# Patient Record
Sex: Male | Born: 1965
Health system: Southern US, Community
[De-identification: ages and names within clinical notes are randomized; demographics above are authoritative.]

## PROBLEM LIST (undated history)

## (undated) DIAGNOSIS — T4145XA Adverse effect of unspecified anesthetic, initial encounter: Secondary | ICD-10-CM

## (undated) DIAGNOSIS — I2510 Atherosclerotic heart disease of native coronary artery without angina pectoris: Secondary | ICD-10-CM

## (undated) DIAGNOSIS — F40240 Claustrophobia: Secondary | ICD-10-CM

## (undated) DIAGNOSIS — I10 Essential (primary) hypertension: Secondary | ICD-10-CM

## (undated) DIAGNOSIS — K5790 Diverticulosis of intestine, part unspecified, without perforation or abscess without bleeding: Secondary | ICD-10-CM

## (undated) DIAGNOSIS — K819 Cholecystitis, unspecified: Secondary | ICD-10-CM

## (undated) DIAGNOSIS — I3139 Other pericardial effusion (noninflammatory): Secondary | ICD-10-CM

## (undated) DIAGNOSIS — I5032 Chronic diastolic (congestive) heart failure: Secondary | ICD-10-CM

## (undated) DIAGNOSIS — R112 Nausea with vomiting, unspecified: Secondary | ICD-10-CM

## (undated) DIAGNOSIS — Z9289 Personal history of other medical treatment: Secondary | ICD-10-CM

## (undated) DIAGNOSIS — K224 Dyskinesia of esophagus: Secondary | ICD-10-CM

## (undated) DIAGNOSIS — K219 Gastro-esophageal reflux disease without esophagitis: Secondary | ICD-10-CM

## (undated) DIAGNOSIS — M1990 Unspecified osteoarthritis, unspecified site: Secondary | ICD-10-CM

## (undated) DIAGNOSIS — E119 Type 2 diabetes mellitus without complications: Secondary | ICD-10-CM

## (undated) DIAGNOSIS — Z9889 Other specified postprocedural states: Secondary | ICD-10-CM

## (undated) DIAGNOSIS — E109 Type 1 diabetes mellitus without complications: Secondary | ICD-10-CM

## (undated) DIAGNOSIS — Z87442 Personal history of urinary calculi: Secondary | ICD-10-CM

## (undated) DIAGNOSIS — M109 Gout, unspecified: Secondary | ICD-10-CM

## (undated) DIAGNOSIS — T884XXA Failed or difficult intubation, initial encounter: Secondary | ICD-10-CM

## (undated) DIAGNOSIS — I5030 Unspecified diastolic (congestive) heart failure: Secondary | ICD-10-CM

## (undated) DIAGNOSIS — J398 Other specified diseases of upper respiratory tract: Secondary | ICD-10-CM

## (undated) DIAGNOSIS — G4733 Obstructive sleep apnea (adult) (pediatric): Secondary | ICD-10-CM

## (undated) DIAGNOSIS — IMO0001 Reserved for inherently not codable concepts without codable children: Secondary | ICD-10-CM

## (undated) DIAGNOSIS — Z794 Long term (current) use of insulin: Secondary | ICD-10-CM

## (undated) DIAGNOSIS — I313 Pericardial effusion (noninflammatory): Secondary | ICD-10-CM

## (undated) DIAGNOSIS — R590 Localized enlarged lymph nodes: Secondary | ICD-10-CM

## (undated) DIAGNOSIS — T8859XA Other complications of anesthesia, initial encounter: Secondary | ICD-10-CM

## (undated) DIAGNOSIS — E039 Hypothyroidism, unspecified: Secondary | ICD-10-CM

## (undated) DIAGNOSIS — B3781 Candidal esophagitis: Secondary | ICD-10-CM

## (undated) DIAGNOSIS — J189 Pneumonia, unspecified organism: Secondary | ICD-10-CM

## (undated) DIAGNOSIS — K602 Anal fissure, unspecified: Secondary | ICD-10-CM

## (undated) DIAGNOSIS — M797 Fibromyalgia: Secondary | ICD-10-CM

## (undated) DIAGNOSIS — E785 Hyperlipidemia, unspecified: Secondary | ICD-10-CM

## (undated) DIAGNOSIS — N186 End stage renal disease: Secondary | ICD-10-CM

## (undated) DIAGNOSIS — K3184 Gastroparesis: Secondary | ICD-10-CM

## (undated) DIAGNOSIS — I739 Peripheral vascular disease, unspecified: Secondary | ICD-10-CM

## (undated) HISTORY — DX: Dyskinesia of esophagus: K22.4

## (undated) HISTORY — PX: ARTERIOVENOUS GRAFT PLACEMENT: SUR1029

## (undated) HISTORY — PX: OTHER SURGICAL HISTORY: SHX169

## (undated) HISTORY — PX: AMPUTATION OF REPLICATED TOES: SHX1136

## (undated) HISTORY — PX: CARPAL TUNNEL RELEASE: SHX101

## (undated) HISTORY — DX: Chronic diastolic (congestive) heart failure: I50.32

## (undated) HISTORY — PX: SHOULDER ARTHROSCOPY: SHX128

## (undated) HISTORY — PX: CATARACT EXTRACTION W/ INTRAOCULAR LENS  IMPLANT, BILATERAL: SHX1307

## (undated) HISTORY — PX: EYE SURGERY: SHX253

## (undated) HISTORY — DX: Anal fissure, unspecified: K60.2

## (undated) HISTORY — PX: APPENDECTOMY: SHX54

## (undated) HISTORY — PX: VITRECTOMY: SHX106

## (undated) HISTORY — DX: Hyperlipidemia, unspecified: E78.5

## (undated) HISTORY — PX: PERITONEAL CATHETER INSERTION: SHX2223

## (undated) HISTORY — DX: Candidal esophagitis: B37.81

## (undated) HISTORY — PX: PERITONEAL CATHETER REMOVAL: SUR1106

## (undated) HISTORY — DX: Unspecified diastolic (congestive) heart failure: I50.30

## (undated) HISTORY — PX: NEUROPLASTY / TRANSPOSITION ULNAR NERVE AT ELBOW: SUR895

## (undated) HISTORY — PX: TOE AMPUTATION: SHX809

## (undated) HISTORY — PX: UMBILICAL HERNIA REPAIR: SHX196

## (undated) HISTORY — DX: Diverticulosis of intestine, part unspecified, without perforation or abscess without bleeding: K57.90

---

## 1993-04-25 HISTORY — PX: KIDNEY TRANSPLANT: SHX239

## 1997-06-04 ENCOUNTER — Ambulatory Visit: Admission: RE | Admit: 1997-06-04 | Discharge: 1997-07-22 | Payer: Self-pay | Admitting: Internal Medicine

## 1997-07-28 ENCOUNTER — Other Ambulatory Visit: Admission: RE | Admit: 1997-07-28 | Discharge: 1997-07-28 | Payer: Self-pay | Admitting: *Deleted

## 1997-09-02 ENCOUNTER — Other Ambulatory Visit: Admission: RE | Admit: 1997-09-02 | Discharge: 1997-09-02 | Payer: Self-pay | Admitting: *Deleted

## 1997-09-09 ENCOUNTER — Other Ambulatory Visit: Admission: RE | Admit: 1997-09-09 | Discharge: 1997-09-09 | Payer: Self-pay | Admitting: *Deleted

## 1997-09-12 ENCOUNTER — Ambulatory Visit (HOSPITAL_COMMUNITY): Admission: RE | Admit: 1997-09-12 | Discharge: 1997-09-12 | Payer: Self-pay | Admitting: *Deleted

## 1997-09-16 ENCOUNTER — Other Ambulatory Visit: Admission: RE | Admit: 1997-09-16 | Discharge: 1997-09-16 | Payer: Self-pay | Admitting: *Deleted

## 1997-09-23 ENCOUNTER — Encounter: Admission: RE | Admit: 1997-09-23 | Discharge: 1997-12-22 | Payer: Self-pay | Admitting: Internal Medicine

## 1997-09-23 ENCOUNTER — Encounter: Admission: RE | Admit: 1997-09-23 | Discharge: 1997-09-23 | Payer: Self-pay | Admitting: Internal Medicine

## 1997-09-29 ENCOUNTER — Other Ambulatory Visit: Admission: RE | Admit: 1997-09-29 | Discharge: 1997-09-29 | Payer: Self-pay | Admitting: *Deleted

## 1997-10-06 ENCOUNTER — Other Ambulatory Visit: Admission: RE | Admit: 1997-10-06 | Discharge: 1997-10-06 | Payer: Self-pay | Admitting: *Deleted

## 1997-10-13 ENCOUNTER — Other Ambulatory Visit: Admission: RE | Admit: 1997-10-13 | Discharge: 1997-10-13 | Payer: Self-pay | Admitting: *Deleted

## 1997-10-24 ENCOUNTER — Other Ambulatory Visit: Admission: RE | Admit: 1997-10-24 | Discharge: 1997-10-24 | Payer: Self-pay | Admitting: *Deleted

## 1997-10-31 ENCOUNTER — Ambulatory Visit (HOSPITAL_COMMUNITY): Admission: RE | Admit: 1997-10-31 | Discharge: 1997-10-31 | Payer: Self-pay | Admitting: Nephrology

## 1997-11-14 ENCOUNTER — Other Ambulatory Visit: Admission: RE | Admit: 1997-11-14 | Discharge: 1997-11-14 | Payer: Self-pay | Admitting: *Deleted

## 1997-11-21 ENCOUNTER — Other Ambulatory Visit: Admission: RE | Admit: 1997-11-21 | Discharge: 1997-11-21 | Payer: Self-pay | Admitting: *Deleted

## 1997-12-30 ENCOUNTER — Encounter: Admission: RE | Admit: 1997-12-30 | Discharge: 1998-03-30 | Payer: Self-pay | Admitting: Internal Medicine

## 1998-01-28 ENCOUNTER — Encounter (HOSPITAL_BASED_OUTPATIENT_CLINIC_OR_DEPARTMENT_OTHER): Payer: Self-pay | Admitting: Internal Medicine

## 1998-02-23 ENCOUNTER — Ambulatory Visit (HOSPITAL_COMMUNITY): Admission: RE | Admit: 1998-02-23 | Discharge: 1998-02-23 | Payer: Self-pay | Admitting: Internal Medicine

## 1998-04-21 ENCOUNTER — Encounter: Admission: RE | Admit: 1998-04-21 | Discharge: 1998-05-15 | Payer: Self-pay | Admitting: Internal Medicine

## 1998-04-27 ENCOUNTER — Ambulatory Visit (HOSPITAL_COMMUNITY): Admission: RE | Admit: 1998-04-27 | Discharge: 1998-04-27 | Payer: Self-pay | Admitting: Internal Medicine

## 1998-04-27 ENCOUNTER — Encounter (HOSPITAL_BASED_OUTPATIENT_CLINIC_OR_DEPARTMENT_OTHER): Payer: Self-pay | Admitting: Internal Medicine

## 1998-07-01 ENCOUNTER — Encounter: Admission: RE | Admit: 1998-07-01 | Discharge: 1998-09-10 | Payer: Self-pay | Admitting: Internal Medicine

## 1998-07-03 ENCOUNTER — Ambulatory Visit (HOSPITAL_COMMUNITY): Admission: RE | Admit: 1998-07-03 | Discharge: 1998-07-03 | Payer: Self-pay | Admitting: Internal Medicine

## 1998-07-29 ENCOUNTER — Encounter (HOSPITAL_BASED_OUTPATIENT_CLINIC_OR_DEPARTMENT_OTHER): Payer: Self-pay | Admitting: Internal Medicine

## 1998-08-04 ENCOUNTER — Emergency Department (HOSPITAL_COMMUNITY): Admission: EM | Admit: 1998-08-04 | Discharge: 1998-08-04 | Payer: Self-pay | Admitting: Emergency Medicine

## 1998-09-20 ENCOUNTER — Emergency Department (HOSPITAL_COMMUNITY): Admission: EM | Admit: 1998-09-20 | Discharge: 1998-09-20 | Payer: Self-pay

## 1999-05-04 ENCOUNTER — Ambulatory Visit (HOSPITAL_BASED_OUTPATIENT_CLINIC_OR_DEPARTMENT_OTHER): Admission: RE | Admit: 1999-05-04 | Discharge: 1999-05-04 | Payer: Self-pay | Admitting: Orthopedic Surgery

## 1999-10-20 ENCOUNTER — Encounter: Admission: RE | Admit: 1999-10-20 | Discharge: 2000-01-18 | Payer: Self-pay | Admitting: Internal Medicine

## 1999-10-28 ENCOUNTER — Emergency Department (HOSPITAL_COMMUNITY): Admission: EM | Admit: 1999-10-28 | Discharge: 1999-10-29 | Payer: Self-pay | Admitting: Emergency Medicine

## 1999-11-25 ENCOUNTER — Emergency Department (HOSPITAL_COMMUNITY): Admission: EM | Admit: 1999-11-25 | Discharge: 1999-11-25 | Payer: Self-pay | Admitting: Emergency Medicine

## 2000-01-26 ENCOUNTER — Encounter: Admission: RE | Admit: 2000-01-26 | Discharge: 2000-04-25 | Payer: Self-pay | Admitting: Internal Medicine

## 2000-02-08 ENCOUNTER — Ambulatory Visit (HOSPITAL_COMMUNITY): Admission: RE | Admit: 2000-02-08 | Discharge: 2000-02-08 | Payer: Self-pay | Admitting: Ophthalmology

## 2000-02-08 ENCOUNTER — Encounter: Payer: Self-pay | Admitting: Ophthalmology

## 2000-04-05 ENCOUNTER — Encounter: Admission: RE | Admit: 2000-04-05 | Discharge: 2000-07-04 | Payer: Self-pay | Admitting: Internal Medicine

## 2000-04-26 ENCOUNTER — Encounter: Admission: RE | Admit: 2000-04-26 | Discharge: 2000-07-03 | Payer: Self-pay | Admitting: Internal Medicine

## 2000-07-07 ENCOUNTER — Ambulatory Visit (HOSPITAL_BASED_OUTPATIENT_CLINIC_OR_DEPARTMENT_OTHER): Admission: RE | Admit: 2000-07-07 | Discharge: 2000-07-07 | Payer: Self-pay | Admitting: Orthopedic Surgery

## 2000-08-09 ENCOUNTER — Encounter: Admission: RE | Admit: 2000-08-09 | Discharge: 2000-11-07 | Payer: Self-pay | Admitting: Internal Medicine

## 2000-10-13 ENCOUNTER — Encounter: Payer: Self-pay | Admitting: Emergency Medicine

## 2000-10-13 ENCOUNTER — Inpatient Hospital Stay (HOSPITAL_COMMUNITY): Admission: EM | Admit: 2000-10-13 | Discharge: 2000-10-14 | Payer: Self-pay | Admitting: Emergency Medicine

## 2000-12-09 ENCOUNTER — Emergency Department (HOSPITAL_COMMUNITY): Admission: EM | Admit: 2000-12-09 | Discharge: 2000-12-09 | Payer: Self-pay | Admitting: Emergency Medicine

## 2001-01-24 ENCOUNTER — Encounter: Admission: RE | Admit: 2001-01-24 | Discharge: 2001-01-29 | Payer: Self-pay | Admitting: Internal Medicine

## 2001-02-22 ENCOUNTER — Emergency Department (HOSPITAL_COMMUNITY): Admission: EM | Admit: 2001-02-22 | Discharge: 2001-02-22 | Payer: Self-pay | Admitting: Emergency Medicine

## 2001-02-22 ENCOUNTER — Encounter: Payer: Self-pay | Admitting: Emergency Medicine

## 2001-05-07 ENCOUNTER — Encounter: Payer: Self-pay | Admitting: Internal Medicine

## 2001-05-07 ENCOUNTER — Encounter: Admission: RE | Admit: 2001-05-07 | Discharge: 2001-05-07 | Payer: Self-pay | Admitting: Internal Medicine

## 2001-05-09 ENCOUNTER — Encounter: Admission: RE | Admit: 2001-05-09 | Discharge: 2001-06-06 | Payer: Self-pay | Admitting: Internal Medicine

## 2001-05-31 ENCOUNTER — Emergency Department (HOSPITAL_COMMUNITY): Admission: EM | Admit: 2001-05-31 | Discharge: 2001-05-31 | Payer: Self-pay

## 2001-05-31 ENCOUNTER — Encounter: Payer: Self-pay | Admitting: Nephrology

## 2002-08-02 ENCOUNTER — Encounter (HOSPITAL_BASED_OUTPATIENT_CLINIC_OR_DEPARTMENT_OTHER): Admission: RE | Admit: 2002-08-02 | Discharge: 2002-10-31 | Payer: Self-pay | Admitting: Internal Medicine

## 2002-09-03 ENCOUNTER — Inpatient Hospital Stay (HOSPITAL_COMMUNITY): Admission: AD | Admit: 2002-09-03 | Discharge: 2002-09-13 | Payer: Self-pay | Admitting: Nephrology

## 2002-09-04 ENCOUNTER — Encounter: Payer: Self-pay | Admitting: Nephrology

## 2002-09-06 ENCOUNTER — Encounter: Payer: Self-pay | Admitting: Nephrology

## 2002-09-07 ENCOUNTER — Encounter: Payer: Self-pay | Admitting: Nephrology

## 2002-09-08 ENCOUNTER — Encounter (INDEPENDENT_AMBULATORY_CARE_PROVIDER_SITE_OTHER): Payer: Self-pay | Admitting: *Deleted

## 2002-09-09 ENCOUNTER — Encounter: Payer: Self-pay | Admitting: Nephrology

## 2002-09-10 ENCOUNTER — Encounter: Payer: Self-pay | Admitting: Nephrology

## 2002-09-24 ENCOUNTER — Encounter: Payer: Self-pay | Admitting: Emergency Medicine

## 2002-09-24 ENCOUNTER — Inpatient Hospital Stay (HOSPITAL_COMMUNITY): Admission: EM | Admit: 2002-09-24 | Discharge: 2002-10-04 | Payer: Self-pay | Admitting: Emergency Medicine

## 2002-09-25 ENCOUNTER — Encounter: Payer: Self-pay | Admitting: Nephrology

## 2002-11-15 ENCOUNTER — Encounter (HOSPITAL_BASED_OUTPATIENT_CLINIC_OR_DEPARTMENT_OTHER): Admission: RE | Admit: 2002-11-15 | Discharge: 2003-02-13 | Payer: Self-pay | Admitting: Internal Medicine

## 2002-11-21 ENCOUNTER — Encounter (HOSPITAL_BASED_OUTPATIENT_CLINIC_OR_DEPARTMENT_OTHER): Admission: RE | Admit: 2002-11-21 | Discharge: 2003-02-19 | Payer: Self-pay | Admitting: Internal Medicine

## 2002-12-18 ENCOUNTER — Encounter (HOSPITAL_BASED_OUTPATIENT_CLINIC_OR_DEPARTMENT_OTHER): Payer: Self-pay | Admitting: Internal Medicine

## 2002-12-18 ENCOUNTER — Encounter: Admission: RE | Admit: 2002-12-18 | Discharge: 2002-12-18 | Payer: Self-pay | Admitting: Internal Medicine

## 2003-02-17 ENCOUNTER — Encounter (HOSPITAL_BASED_OUTPATIENT_CLINIC_OR_DEPARTMENT_OTHER): Admission: RE | Admit: 2003-02-17 | Discharge: 2003-05-18 | Payer: Self-pay | Admitting: Internal Medicine

## 2003-02-20 ENCOUNTER — Emergency Department (HOSPITAL_COMMUNITY): Admission: EM | Admit: 2003-02-20 | Discharge: 2003-02-20 | Payer: Self-pay | Admitting: Emergency Medicine

## 2003-03-30 ENCOUNTER — Ambulatory Visit (HOSPITAL_COMMUNITY): Admission: RE | Admit: 2003-03-30 | Discharge: 2003-03-30 | Payer: Self-pay | Admitting: Internal Medicine

## 2003-04-15 ENCOUNTER — Encounter: Admission: RE | Admit: 2003-04-15 | Discharge: 2003-04-15 | Payer: Self-pay | Admitting: Nephrology

## 2003-05-20 ENCOUNTER — Encounter (HOSPITAL_BASED_OUTPATIENT_CLINIC_OR_DEPARTMENT_OTHER): Admission: RE | Admit: 2003-05-20 | Discharge: 2003-08-18 | Payer: Self-pay | Admitting: Internal Medicine

## 2003-06-20 ENCOUNTER — Encounter (INDEPENDENT_AMBULATORY_CARE_PROVIDER_SITE_OTHER): Payer: Self-pay | Admitting: Specialist

## 2003-06-20 ENCOUNTER — Ambulatory Visit (HOSPITAL_COMMUNITY): Admission: RE | Admit: 2003-06-20 | Discharge: 2003-06-21 | Payer: Self-pay | Admitting: Orthopedic Surgery

## 2003-08-13 ENCOUNTER — Encounter (HOSPITAL_BASED_OUTPATIENT_CLINIC_OR_DEPARTMENT_OTHER): Admission: RE | Admit: 2003-08-13 | Discharge: 2003-10-31 | Payer: Self-pay | Admitting: Internal Medicine

## 2003-08-21 ENCOUNTER — Inpatient Hospital Stay (HOSPITAL_COMMUNITY): Admission: AD | Admit: 2003-08-21 | Discharge: 2003-08-27 | Payer: Self-pay | Admitting: Nephrology

## 2003-09-03 ENCOUNTER — Encounter (HOSPITAL_COMMUNITY): Admission: RE | Admit: 2003-09-03 | Discharge: 2003-12-02 | Payer: Self-pay | Admitting: Nephrology

## 2003-11-12 ENCOUNTER — Encounter (HOSPITAL_BASED_OUTPATIENT_CLINIC_OR_DEPARTMENT_OTHER): Admission: RE | Admit: 2003-11-12 | Discharge: 2004-02-10 | Payer: Self-pay | Admitting: Internal Medicine

## 2004-02-02 ENCOUNTER — Encounter (HOSPITAL_COMMUNITY): Admission: RE | Admit: 2004-02-02 | Discharge: 2004-05-02 | Payer: Self-pay | Admitting: Nephrology

## 2004-02-03 ENCOUNTER — Ambulatory Visit (HOSPITAL_BASED_OUTPATIENT_CLINIC_OR_DEPARTMENT_OTHER): Admission: RE | Admit: 2004-02-03 | Discharge: 2004-02-03 | Payer: Self-pay | Admitting: Orthopedic Surgery

## 2004-02-05 ENCOUNTER — Encounter: Admission: RE | Admit: 2004-02-05 | Discharge: 2004-02-05 | Payer: Self-pay | Admitting: Nephrology

## 2004-02-11 ENCOUNTER — Encounter (HOSPITAL_BASED_OUTPATIENT_CLINIC_OR_DEPARTMENT_OTHER): Admission: RE | Admit: 2004-02-11 | Discharge: 2004-05-07 | Payer: Self-pay | Admitting: Internal Medicine

## 2004-05-10 ENCOUNTER — Encounter (HOSPITAL_BASED_OUTPATIENT_CLINIC_OR_DEPARTMENT_OTHER): Admission: RE | Admit: 2004-05-10 | Discharge: 2004-07-21 | Payer: Self-pay | Admitting: Internal Medicine

## 2004-05-12 ENCOUNTER — Encounter (HOSPITAL_COMMUNITY): Admission: RE | Admit: 2004-05-12 | Discharge: 2004-08-10 | Payer: Self-pay | Admitting: Nephrology

## 2004-05-27 ENCOUNTER — Encounter: Admission: RE | Admit: 2004-05-27 | Discharge: 2004-05-27 | Payer: Self-pay | Admitting: Surgery

## 2004-07-26 ENCOUNTER — Encounter (HOSPITAL_BASED_OUTPATIENT_CLINIC_OR_DEPARTMENT_OTHER): Admission: RE | Admit: 2004-07-26 | Discharge: 2004-10-24 | Payer: Self-pay | Admitting: Surgery

## 2004-08-19 ENCOUNTER — Encounter (INDEPENDENT_AMBULATORY_CARE_PROVIDER_SITE_OTHER): Payer: Self-pay | Admitting: *Deleted

## 2004-08-19 ENCOUNTER — Inpatient Hospital Stay (HOSPITAL_COMMUNITY): Admission: EM | Admit: 2004-08-19 | Discharge: 2004-08-20 | Payer: Self-pay | Admitting: Emergency Medicine

## 2004-08-25 ENCOUNTER — Encounter (HOSPITAL_COMMUNITY): Admission: RE | Admit: 2004-08-25 | Discharge: 2004-11-23 | Payer: Self-pay | Admitting: Nephrology

## 2004-08-26 ENCOUNTER — Encounter: Admission: RE | Admit: 2004-08-26 | Discharge: 2004-08-26 | Payer: Self-pay | Admitting: General Surgery

## 2004-10-21 ENCOUNTER — Encounter (INDEPENDENT_AMBULATORY_CARE_PROVIDER_SITE_OTHER): Payer: Self-pay | Admitting: Specialist

## 2004-10-21 ENCOUNTER — Inpatient Hospital Stay (HOSPITAL_COMMUNITY): Admission: AD | Admit: 2004-10-21 | Discharge: 2004-10-23 | Payer: Self-pay | Admitting: Orthopedic Surgery

## 2004-11-16 ENCOUNTER — Inpatient Hospital Stay (HOSPITAL_COMMUNITY): Admission: AD | Admit: 2004-11-16 | Discharge: 2004-11-25 | Payer: Self-pay | Admitting: Nephrology

## 2004-12-22 ENCOUNTER — Encounter (HOSPITAL_COMMUNITY): Admission: RE | Admit: 2004-12-22 | Discharge: 2005-01-05 | Payer: Self-pay | Admitting: Nephrology

## 2005-01-19 ENCOUNTER — Encounter (HOSPITAL_COMMUNITY): Admission: RE | Admit: 2005-01-19 | Discharge: 2005-04-19 | Payer: Self-pay | Admitting: Nephrology

## 2005-04-27 ENCOUNTER — Encounter (HOSPITAL_COMMUNITY): Admission: RE | Admit: 2005-04-27 | Discharge: 2005-07-26 | Payer: Self-pay | Admitting: Nephrology

## 2005-08-11 ENCOUNTER — Ambulatory Visit (HOSPITAL_BASED_OUTPATIENT_CLINIC_OR_DEPARTMENT_OTHER): Admission: RE | Admit: 2005-08-11 | Discharge: 2005-08-11 | Payer: Self-pay | Admitting: Orthopedic Surgery

## 2005-08-19 ENCOUNTER — Encounter (HOSPITAL_COMMUNITY): Admission: RE | Admit: 2005-08-19 | Discharge: 2005-11-17 | Payer: Self-pay | Admitting: Nephrology

## 2005-11-30 ENCOUNTER — Encounter (HOSPITAL_COMMUNITY): Admission: RE | Admit: 2005-11-30 | Discharge: 2006-01-06 | Payer: Self-pay | Admitting: Nephrology

## 2006-01-06 ENCOUNTER — Encounter (HOSPITAL_COMMUNITY): Admission: RE | Admit: 2006-01-06 | Discharge: 2006-04-06 | Payer: Self-pay | Admitting: Nephrology

## 2006-03-16 ENCOUNTER — Emergency Department (HOSPITAL_COMMUNITY): Admission: EM | Admit: 2006-03-16 | Discharge: 2006-03-16 | Payer: Self-pay | Admitting: Family Medicine

## 2006-04-26 ENCOUNTER — Encounter (HOSPITAL_COMMUNITY): Admission: RE | Admit: 2006-04-26 | Discharge: 2006-07-25 | Payer: Self-pay | Admitting: Nephrology

## 2006-08-11 ENCOUNTER — Ambulatory Visit: Payer: Self-pay | Admitting: Gastroenterology

## 2006-08-11 ENCOUNTER — Encounter (HOSPITAL_COMMUNITY): Admission: RE | Admit: 2006-08-11 | Discharge: 2006-11-09 | Payer: Self-pay | Admitting: Nephrology

## 2006-09-06 ENCOUNTER — Encounter (INDEPENDENT_AMBULATORY_CARE_PROVIDER_SITE_OTHER): Payer: Self-pay | Admitting: Specialist

## 2006-09-06 ENCOUNTER — Ambulatory Visit: Payer: Self-pay | Admitting: Gastroenterology

## 2006-11-13 ENCOUNTER — Encounter (HOSPITAL_COMMUNITY): Admission: RE | Admit: 2006-11-13 | Discharge: 2007-01-29 | Payer: Self-pay | Admitting: Nephrology

## 2006-11-28 ENCOUNTER — Ambulatory Visit (HOSPITAL_BASED_OUTPATIENT_CLINIC_OR_DEPARTMENT_OTHER): Admission: RE | Admit: 2006-11-28 | Discharge: 2006-11-28 | Payer: Self-pay | Admitting: Orthopedic Surgery

## 2007-03-30 ENCOUNTER — Encounter (HOSPITAL_COMMUNITY): Admission: RE | Admit: 2007-03-30 | Discharge: 2007-06-28 | Payer: Self-pay | Admitting: Nephrology

## 2007-07-06 ENCOUNTER — Encounter (HOSPITAL_COMMUNITY): Admission: RE | Admit: 2007-07-06 | Discharge: 2007-10-04 | Payer: Self-pay | Admitting: Nephrology

## 2007-10-12 ENCOUNTER — Encounter (HOSPITAL_COMMUNITY): Admission: RE | Admit: 2007-10-12 | Discharge: 2008-01-10 | Payer: Self-pay | Admitting: Nephrology

## 2008-01-04 ENCOUNTER — Encounter (INDEPENDENT_AMBULATORY_CARE_PROVIDER_SITE_OTHER): Payer: Self-pay | Admitting: Nephrology

## 2008-01-04 ENCOUNTER — Ambulatory Visit: Payer: Self-pay

## 2008-01-17 ENCOUNTER — Encounter (HOSPITAL_COMMUNITY): Admission: RE | Admit: 2008-01-17 | Discharge: 2008-04-16 | Payer: Self-pay | Admitting: Nephrology

## 2008-04-06 ENCOUNTER — Emergency Department (HOSPITAL_COMMUNITY): Admission: EM | Admit: 2008-04-06 | Discharge: 2008-04-06 | Payer: Self-pay | Admitting: Emergency Medicine

## 2008-04-25 HISTORY — PX: BELOW KNEE LEG AMPUTATION: SUR23

## 2008-04-28 ENCOUNTER — Encounter (HOSPITAL_COMMUNITY): Admission: RE | Admit: 2008-04-28 | Discharge: 2008-07-27 | Payer: Self-pay | Admitting: Nephrology

## 2008-07-31 ENCOUNTER — Encounter (HOSPITAL_COMMUNITY): Admission: RE | Admit: 2008-07-31 | Discharge: 2008-10-29 | Payer: Self-pay | Admitting: Nephrology

## 2008-11-27 ENCOUNTER — Encounter (HOSPITAL_COMMUNITY): Admission: RE | Admit: 2008-11-27 | Discharge: 2009-02-25 | Payer: Self-pay | Admitting: Nephrology

## 2009-03-09 ENCOUNTER — Encounter (HOSPITAL_COMMUNITY): Admission: RE | Admit: 2009-03-09 | Discharge: 2009-06-05 | Payer: Self-pay | Admitting: Nephrology

## 2009-06-08 ENCOUNTER — Encounter (HOSPITAL_COMMUNITY): Admission: RE | Admit: 2009-06-08 | Discharge: 2009-09-06 | Payer: Self-pay | Admitting: Nephrology

## 2009-09-28 ENCOUNTER — Encounter (HOSPITAL_COMMUNITY): Admission: RE | Admit: 2009-09-28 | Discharge: 2009-12-27 | Payer: Self-pay | Admitting: Nephrology

## 2010-01-11 ENCOUNTER — Encounter (HOSPITAL_COMMUNITY)
Admission: RE | Admit: 2010-01-11 | Discharge: 2010-04-11 | Payer: Self-pay | Source: Home / Self Care | Attending: Nephrology | Admitting: Nephrology

## 2010-05-03 ENCOUNTER — Encounter (HOSPITAL_COMMUNITY)
Admission: RE | Admit: 2010-05-03 | Discharge: 2010-05-25 | Payer: Self-pay | Source: Home / Self Care | Attending: Nephrology | Admitting: Nephrology

## 2010-05-10 LAB — POCT HEMOGLOBIN-HEMACUE: Hemoglobin: 10 g/dL — ABNORMAL LOW (ref 13.0–17.0)

## 2010-05-25 LAB — POCT HEMOGLOBIN-HEMACUE: Hemoglobin: 10.3 g/dL — ABNORMAL LOW (ref 13.0–17.0)

## 2010-06-14 ENCOUNTER — Encounter (HOSPITAL_COMMUNITY): Payer: Self-pay

## 2010-06-17 ENCOUNTER — Encounter (HOSPITAL_COMMUNITY): Payer: Self-pay

## 2010-07-02 ENCOUNTER — Encounter (HOSPITAL_COMMUNITY): Payer: Medicare Other | Attending: Nephrology

## 2010-07-02 ENCOUNTER — Other Ambulatory Visit: Payer: Self-pay

## 2010-07-02 DIAGNOSIS — D638 Anemia in other chronic diseases classified elsewhere: Secondary | ICD-10-CM | POA: Insufficient documentation

## 2010-07-02 DIAGNOSIS — N184 Chronic kidney disease, stage 4 (severe): Secondary | ICD-10-CM | POA: Insufficient documentation

## 2010-07-05 LAB — POCT HEMOGLOBIN-HEMACUE: Hemoglobin: 9.4 g/dL — ABNORMAL LOW (ref 13.0–17.0)

## 2010-07-06 LAB — POCT HEMOGLOBIN-HEMACUE: Hemoglobin: 10.8 g/dL — ABNORMAL LOW (ref 13.0–17.0)

## 2010-07-07 LAB — POCT HEMOGLOBIN-HEMACUE
Hemoglobin: 10.8 g/dL — ABNORMAL LOW (ref 13.0–17.0)
Hemoglobin: 11.2 g/dL — ABNORMAL LOW (ref 13.0–17.0)

## 2010-07-08 LAB — POCT HEMOGLOBIN-HEMACUE: Hemoglobin: 11.5 g/dL — ABNORMAL LOW (ref 13.0–17.0)

## 2010-07-09 LAB — POCT HEMOGLOBIN-HEMACUE: Hemoglobin: 11.5 g/dL — ABNORMAL LOW (ref 13.0–17.0)

## 2010-07-10 LAB — POCT HEMOGLOBIN-HEMACUE: Hemoglobin: 11 g/dL — ABNORMAL LOW (ref 13.0–17.0)

## 2010-07-11 LAB — POCT HEMOGLOBIN-HEMACUE: Hemoglobin: 13.4 g/dL (ref 13.0–17.0)

## 2010-07-12 LAB — POCT HEMOGLOBIN-HEMACUE
Hemoglobin: 11.8 g/dL — ABNORMAL LOW (ref 13.0–17.0)
Hemoglobin: 11.5 g/dL — ABNORMAL LOW (ref 13.0–17.0)

## 2010-07-13 LAB — POCT HEMOGLOBIN-HEMACUE: Hemoglobin: 11.2 g/dL — ABNORMAL LOW (ref 13.0–17.0)

## 2010-07-14 LAB — POCT HEMOGLOBIN-HEMACUE: Hemoglobin: 11 g/dL — ABNORMAL LOW (ref 13.0–17.0)

## 2010-07-16 LAB — POCT HEMOGLOBIN-HEMACUE: Hemoglobin: 12 g/dL — ABNORMAL LOW (ref 13.0–17.0)

## 2010-07-19 LAB — POCT HEMOGLOBIN-HEMACUE: Hemoglobin: 11.6 g/dL — ABNORMAL LOW (ref 13.0–17.0)

## 2010-07-23 ENCOUNTER — Encounter (HOSPITAL_COMMUNITY): Payer: Medicare Other

## 2010-07-23 ENCOUNTER — Other Ambulatory Visit: Payer: Self-pay | Admitting: Nephrology

## 2010-07-26 LAB — FERRITIN: Ferritin: 96 ng/mL (ref 22–322)

## 2010-07-26 LAB — POCT HEMOGLOBIN-HEMACUE
Hemoglobin: 11.1 g/dL — ABNORMAL LOW (ref 13.0–17.0)
Hemoglobin: 10.2 g/dL — ABNORMAL LOW (ref 13.0–17.0)

## 2010-07-26 LAB — IRON AND TIBC
Iron: 45 ug/dL (ref 42–135)
Saturation Ratios: 17 % — ABNORMAL LOW (ref 20–55)
TIBC: 258 ug/dL (ref 215–435)
UIBC: 213 ug/dL

## 2010-07-28 LAB — POCT HEMOGLOBIN-HEMACUE: Hemoglobin: 11.7 g/dL — ABNORMAL LOW (ref 13.0–17.0)

## 2010-07-29 LAB — POCT HEMOGLOBIN-HEMACUE: Hemoglobin: 11.5 g/dL — ABNORMAL LOW (ref 13.0–17.0)

## 2010-07-30 LAB — POCT HEMOGLOBIN-HEMACUE: Hemoglobin: 11.3 g/dL — ABNORMAL LOW (ref 13.0–17.0)

## 2010-08-01 LAB — POCT HEMOGLOBIN-HEMACUE: Hemoglobin: 12.1 g/dL — ABNORMAL LOW (ref 13.0–17.0)

## 2010-08-02 LAB — IRON AND TIBC
Iron: 50 ug/dL (ref 42–135)
Saturation Ratios: 22 % (ref 20–55)
TIBC: 231 ug/dL (ref 215–435)
UIBC: 181 ug/dL

## 2010-08-02 LAB — POCT HEMOGLOBIN-HEMACUE
Hemoglobin: 11.7 g/dL — ABNORMAL LOW (ref 13.0–17.0)
Hemoglobin: 12 g/dL — ABNORMAL LOW (ref 13.0–17.0)

## 2010-08-02 LAB — FERRITIN: Ferritin: 136 ng/mL (ref 22–322)

## 2010-08-03 LAB — POCT HEMOGLOBIN-HEMACUE
Hemoglobin: 12.5 g/dL — ABNORMAL LOW (ref 13.0–17.0)
Hemoglobin: 12.1 g/dL — ABNORMAL LOW (ref 13.0–17.0)

## 2010-08-04 LAB — POCT HEMOGLOBIN-HEMACUE: Hemoglobin: 11.3 g/dL — ABNORMAL LOW (ref 13.0–17.0)

## 2010-08-05 LAB — IRON AND TIBC
Iron: 75 ug/dL (ref 42–135)
Saturation Ratios: 34 % (ref 20–55)
TIBC: 221 ug/dL (ref 215–435)
UIBC: 146 ug/dL

## 2010-08-05 LAB — POCT HEMOGLOBIN-HEMACUE
Hemoglobin: 13.2 g/dL (ref 13.0–17.0)
Hemoglobin: 12.2 g/dL — ABNORMAL LOW (ref 13.0–17.0)

## 2010-08-05 LAB — FERRITIN: Ferritin: 108 ng/mL (ref 22–322)

## 2010-08-09 LAB — RENAL FUNCTION PANEL
Albumin: 2.9 g/dL — ABNORMAL LOW (ref 3.5–5.2)
BUN: 91 mg/dL — ABNORMAL HIGH (ref 6–23)
CO2: 25 mEq/L (ref 19–32)
Calcium: 9 mg/dL (ref 8.4–10.5)
Chloride: 105 mEq/L (ref 96–112)
Creatinine, Ser: 1.83 mg/dL — ABNORMAL HIGH (ref 0.4–1.5)
GFR calc Af Amer: 49 mL/min — ABNORMAL LOW (ref >60)
GFR calc non Af Amer: 41 mL/min — ABNORMAL LOW (ref >60)
Glucose, Bld: 94 mg/dL (ref 70–99)
Phosphorus: 3.9 mg/dL (ref 2.3–4.6)
Potassium: 3.6 mEq/L (ref 3.5–5.1)
Sodium: 136 mEq/L (ref 135–145)

## 2010-08-09 LAB — POCT HEMOGLOBIN-HEMACUE
Hemoglobin: 12.3 g/dL — ABNORMAL LOW (ref 13.0–17.0)
Hemoglobin: 12.5 g/dL — ABNORMAL LOW (ref 13.0–17.0)

## 2010-08-09 LAB — CYCLOSPORINE: Cyclosporine, LabCorp: 265 ng/mL (ref 100–400)

## 2010-08-10 LAB — POCT HEMOGLOBIN-HEMACUE
Hemoglobin: 11.2 g/dL — ABNORMAL LOW (ref 13.0–17.0)
Hemoglobin: 11.9 g/dL — ABNORMAL LOW (ref 13.0–17.0)

## 2010-08-13 ENCOUNTER — Encounter (HOSPITAL_COMMUNITY)
Admission: RE | Admit: 2010-08-13 | Discharge: 2010-08-13 | Disposition: A | Discharge status: Home / Self Care | Payer: Medicare Other | Source: Ambulatory Visit | Attending: Nephrology | Admitting: Nephrology

## 2010-08-13 ENCOUNTER — Other Ambulatory Visit: Payer: Self-pay | Admitting: Nephrology

## 2010-08-13 DIAGNOSIS — N184 Chronic kidney disease, stage 4 (severe): Secondary | ICD-10-CM | POA: Insufficient documentation

## 2010-08-13 DIAGNOSIS — D638 Anemia in other chronic diseases classified elsewhere: Secondary | ICD-10-CM | POA: Insufficient documentation

## 2010-08-13 LAB — POCT HEMOGLOBIN-HEMACUE: Hemoglobin: 11 g/dL — ABNORMAL LOW (ref 13.0–17.0)

## 2010-09-03 ENCOUNTER — Encounter (HOSPITAL_COMMUNITY): Payer: Medicare Other | Attending: Nephrology

## 2010-09-03 ENCOUNTER — Other Ambulatory Visit: Payer: Self-pay | Admitting: Nephrology

## 2010-09-03 DIAGNOSIS — N184 Chronic kidney disease, stage 4 (severe): Secondary | ICD-10-CM | POA: Insufficient documentation

## 2010-09-03 DIAGNOSIS — D638 Anemia in other chronic diseases classified elsewhere: Secondary | ICD-10-CM | POA: Insufficient documentation

## 2010-09-03 LAB — POCT HEMOGLOBIN-HEMACUE: Hemoglobin: 11.6 g/dL — ABNORMAL LOW (ref 13.0–17.0)

## 2010-09-07 NOTE — Op Note (Signed)
NAMEISIDOR, CABAN                  ACCOUNT NO.:  [id number]   MEDICAL RECORD NO.:  [id number]          PATIENT TYPE:  AMB   LOCATION:  [place]                          FACILITY:  [facility]   PHYSICIAN:  Youlanda Mighty. Sypher, M.D. DATE OF BIRTH:  Feb 15, 1966   DATE OF PROCEDURE:  11/28/2006  DATE OF DISCHARGE:                               OPERATIVE REPORT   PREOPERATIVE DIAGNOSIS:  Chronic insulin-dependent diabetes with history  of chronic renal failure and gout status post renal transplant with  severe left carpal tunnel syndrome, severe left cubital tunnel syndrome,  and severe left frozen shoulder.   POSTOPERATIVE DIAGNOSES:  1. Chronic insulin-dependent diabetes with history of chronic renal      failure and gout status post renal transplant with severe left      carpal tunnel syndrome, severe left cubital tunnel syndrome, and      severe left frozen shoulder.  2. Acromioclavicular degenerative arthritis and prominent      acromioclavicular osteophyte at distal clavicle.   OPERATION:  1. Examination of left shoulder under anesthesia.  2. Left carpal tunnel release.  3. Through separate incision, decompression of left ulnar nerve at      cubital tunnel.  4. Left shoulder arthroscopic capsulectomy and debridement of      granulation tissue and scar adhesions on subscapularis.  5. Arthroscopic subacromial decompression with bursectomy,      coracoacromial ligament relaxation, and acromioplasty.  6. Arthroscopic resection of distal clavicle.   SURGEON:  Youlanda Mighty. Sypher, M.D.   ASSISTANT:  Julian Reil, P.A.-C.   ANESTHESIA:  General endotracheal.   SUPERVISING ANESTHESIOLOGIST:  Soledad Gerlach, M.D.   INDICATIONS:  Mathieu Zehm is a 45 year old gentleman with chronic insulin-  dependent diabetes, gout, status post chronic renal failure status post  transplant, with azotemia who has had a history of bilateral severe  median and ulnar entrapment neuropathy.  He is status  post decompression  surgery on the right with a good result.  He subsequently presented for  evaluation and management of a painful and stiff left shoulder as well  as continued left hand numbness.   Electrodiagnostic studies completed in June of 2008 confirmed severe  left ulnar neuropathy at the cubital tunnel and left carpal tunnel  syndrome.  He has failed a course of therapy for his shoulder.  Therefore, he is brought to the operating at this time for a rather  complex procedure addressing his nerve compression syndrome of the  median ulnar nerves at the wrist and elbow as well as addressing his  chronic adhesive capsulitis.   After informed consent during which we discussed the competing risks of  his diabetes, underlying cardiovascular disease, azotemia, and gout, he  proceeds with anesthesia after speaking with Dr. Oren Bracket  regarding the risks of anesthesia.   His preoperative lab revealed on November 24, 2006 a creatinine of 3.64 and  a BUN of 100.  His potassium and electrolytes were otherwise within  normal limits.  His calcium was 9.1.   After informed consent, he is brought to the operating at  this time.   PROCEDURE:  Albaro Jove was brought to the operating room and placed in  the supine position on the operating table.   Following the induction of general endotracheal anesthesia, he was  carefully positioned in a modified operating table designed for shoulder  arthroscopy.  An arm table was placed on left side to facilitate the  carpal tunnel release and elbow surgery.   A pneumatic tourniquet was applied to the proximal left brachium  followed by routine Betadine scrub and paint of the hand and arm.   The left arm was then draped with sterile stockinette and impervious  arthroscopy drapes.  The arm was exsanguinated with an Esmarch bandage  and the arterial tourniquet inflated to 220 mmHg.  The procedure  commenced with short incision in the line of the  ring finger in the  palm.  The subcutaneous tissues were carefully divided, revealing the  palmar fascia.  This was split longitudinally to the common sensory  branch of the median nerve and superficial palmar arch.  The median  nerve was identified followed by separation of the median nerve from the  deep surface of the transverse carpal ligament.  The tranverse carpal  ligament was released with scissors, extending proximally towards the  volar forearm.  The quality of the tissues was extremely dry due to  chronic diabetes.  After release of the transverse carpal ligament, we  attempted to release the volar forearm fascia.  Due to marked stiffness  of Mr. Claudette Laws fingers, we were unable to do so subcutaneously.  Therefore, a second transverse incision was fashioned in the distal  wrist flexion crease, allowing visualization of the volar forearm  fascia.  The fascia was released 5 cm above the wrist flexion crease,  allowing visualization of the ulnar bursa and musculotendinous junction  of the flexors.   The median nerve was invested in a very dry and fibrotic tenosynovium  which is typical of chronic diabetes.   After bleeding points were electrocauterized with bipolar current, both  wounds were repaired with intradermal 3-0 Prolene, Steri-Strips, and  dressed with sterile gauze and Tegaderm.   Attention was then directed to the elbow.   Due to the severe stiffness of the shoulder, it was quite challenging to  address the cubital tunnel.  With my PA carefully holding the arm, we  proceeded with a 3-cm incision, exposing the ulnar nerve posterior to  the medial epicondyle at the elbow.  The subcutaneous tissues were  carefully divided, taking care to identify the nerve posterior to the  medial epicondyle.  There was a gouty tophaceous deposit in the  olecranon bursa.  The bursa was carefully elevated away from the region  of the ulnar nerve.  The arcuate ligament identified and  released.  The  fascia overlying the nerve proximally was released a distance of 6 cm  above the epicondyle, and the fascia at the head of the flexor carpi  ulnaris was identified and released followed by neurolysis of the ulnar  nerve 6 cm distal to the cubital tunnel.   The nerve was noted be stable through a range of 0-135 degrees elbow  flexion.  Bleeding points were electrocauterized with bipolar current.  The triceps did not require resection.   This wound was then repaired with subdermal sutures of 4-0 Vicryl and  intradermal 3-0 Prolene with Steri-Strips.   Attention was then directed to the shoulder.   Examination of the left shoulder under anesthesia revealed combined  elevation of 135 degrees, external rotation of less than 50 degrees, and  internal rotation of 10 degrees.  This shoulder was extraordinarily  stiff due to the combined effects of adhesive capsulitis, gout, and  chronic renal failure/diabetes.   After the drapes were removed from the arm, [name] was positioned in  the beach chair position with the aid of a torso and head holder  designed for shoulder arthroscopy.  The entire left arm and forequarter  were prepped with DuraPrep followed by application of sterile impervious  arthroscopy drapes.   The procedure commenced with introduction of the arthroscope through a  standard posterior viewing portal.  Capsule entry was quite challenging  due to the fibrotic nature of the capsule.  Once we were within the  joint, visualization of the interior of the glenohumeral joint revealed  an intact origin of the long head of the biceps and a normal biceps  tendon to the rotator interval.  The deep surface of the rotator cuff  revealed that the supraspinatus, infraspinatus, and teres minor were all  intact.  There was profound granulation tissue and severe scarring of  the anterior capsule.  The anatomy of the subscapularis was obliterated.   An anterior portal was  created by through-and-through technique with a  switching stick followed by reintroduction of an anterior cannula.  A  4.5-mm suction shaver was used to perform a complex and extremely  tedious tenolysis of the subscapularis and anterior capsulectomy.  Essentially the entire capsule was resected between the rotator interval  and the subscapularis.  The subscapularis was tenolyzed both on its  superficial and anterior surfaces.  The anterior glenohumeral ligaments  were preserved; however, granulation tissue covering the ligaments was  debrided.  Bipolar cautery was used to obtain hemostasis.  The initial  external rotation of less than 30 degrees was increased to nearly 80  degrees by gentle persistent range of motion during debridement.  Gentle  manipulation also increased the elevation of the shoulder to about 165  degrees.   After hemostasis was assured, we removed the scope and equipment from  the glenohumeral joint and instrumented the subacromial space.  The  scope was introduced through a standard posterior viewing portal into  the bursa.  Florid bursitis was noted.  After bursectomy, the anatomy of  the coracoacromial arch was studied.  The bursa was adherent to the  coracoacromial ligament.  The coracoacromial ligament was released with  the cutting cautery followed by leveling the acromion to a type 1  morphology.  The [facility] joint was quite prominent, causing impingement  against the cuff.  The capsule of AC joint was taken down with the  cutting cautery followed by use of the suction bur to remove the distal  15 mm of clavicle.  Hemostasis was achieved with the bipolar cautery.   After adequate decompression was accomplished, tenolysis of the cuff was  completed by bursectomy.   Photographic documentation of the decompression was obtained followed by  removal of the arthroscopic equipment.   Throughout the procedure, [name] had stable vital signs and a blood  pressure  ranging between 90 and 110.  There were no apparent  complications.   He was awakened from general anesthesia and transferred to the recovery  room with stable signs.  He will likely be discharged home as an  outpatient, but should he show any signs of instability or difficulty  with pain, we will keep him overnight as a 23-hour observation patient.  Youlanda Mighty Sypher, M.D.  Electronically Signed     RVS/MEDQ  D:  11/28/2006  T:  11/28/2006  Job:  [id number]   cc:   Windy Kalata, M.D.

## 2010-09-10 NOTE — Discharge Summary (Signed)
NAMESHAHMIR, KULZER                            ACCOUNT NO.:  [id number]   MEDICAL RECORD NO.:  [id number]                   PATIENT TYPE:  INP   LOCATION:  [id number]                                 FACILITY:  [facility]   PHYSICIAN:  Louis Meckel, M.D.        DATE OF BIRTH:  [birth year]   DATE OF ADMISSION:  09/03/2002  DATE OF DISCHARGE:  09/13/2002                                 DISCHARGE SUMMARY   ADMISSION DIAGNOSES:  1. Cellulitis of the right lower leg.  2. End-stage renal disease, status post cadaveric renal transplant.  3. Hypertension.  4. Insulin-dependent diabetes mellitus.  5. Chronic immunosuppression.   DISCHARGE DIAGNOSES:  1. Cellulitis of the right thigh, improved with antibiotics.  2. Status post removal of infected right AV Gortex graft with vein     angioplasty of the femoral artery, Dr. [name] on Sep 08, 2002.  3. Staphylococcus coagulase negative urinary tract infection, status post     antibiotics.  4. Iron-deficiency anemia.  5. End-stage renal disease, status post cadaveric renal transplant.  6. Chronic immunosuppression.  7. Insulin-dependent diabetes mellitus.  8. Hypertension.   HISTORY OF PRESENT ILLNESS:  The patient is a 45 year old white male with  end-stage renal disease secondary to diabetic nephropathy who is status post  cadaveric renal transplant in 1995, who has recently had trouble with a  right heel ulcer.  He now presents with a 24 to 36 hour history of  tenderness over an old AV Gortex graft in the right thigh, and extending  distally over practically the entire right lower extremity.  His temperature  is 101.7 on admission.  He does have some mild chronic allograft nephropathy  with his most creatinine at 1.7.  He denies cough, diarrhea, abdominal pain,  dysuria.   ADMISSION LABORATORY DATA:  White count 9800, hemoglobin 11.7, hematocrit  33.6, platelets 238,000.  Sodium 135, potassium 4.6, chloride 99, CO2 26,  glucose 506, BUN 54, creatinine 2.2, calcium 9.3, albumin 2.8.  Phosphorus  5.4.   HOSPITAL COURSE:  #1 -  STATUS POST REMOVAL OF INFECTED RIGHT THIGH AV  GORTEX GRAFT AND CELLULITIS OF THE RIGHT LEG:  On admission, an ultrasound  of the soft tissue was done showing fibrosis and probable cellulitis located  superficially to the dialysis graft in the right inner thigh, but no  definite abscess was seen.  The MRI showed diffuse edema of the soft tissue  of the right lower leg and hind foot with edema being primarily in the  subcutaneous tissue, but no discrete focal abnormality of the underlying  musculature, and no discrete osteomyelitis or abscess seen.  Blood cultures  were done on admission.  These remained negative.  Besides his usual  medications, on admission he was placed on Ancef.  ABI's of the lower  extremities showed the right ABI to be not ascertainable secondary to  calcified vessels.  The  left ABI was within normal limits, however,  pressures were falsely elevated secondary to calcified vessels.  Little  improvement was made in the first 48 hours.  Ancef was stopped and the  patient was put on Zosyn instead.  He continued to have fever spikes despite  antibiotic therapy.  Although cultures remained negative, his cellulitis was  minimally improved, and Dr. Scot Dock proceeded with removal of the Gortex  graft from the right thigh on Sep 08, 2002.  He tolerated the procedure  well.  An arterial patch was required.  Thereafter, he was maintained on  Zosyn until the day before discharge when he was switched to Augmentin.  He  was placed on 500 mg a day b.i.d. and given a prescription for seven more  days.  He was to follow up with Dr. Scot Dock on Sep 18, 2002, in the office.  #2 -  END-STAGE RENAL DISEASE, STATUS POST CADAVERIC RENAL TRANSPLANT:  The  patient's creatinine was already slightly elevated on admission which was  not surprising considering his presenting clinical picture.   His creatinine  continued to rise and peaked at 4.6 on Sep 09, 2002.  In response to this  gradual rise, diuretics were held, IV fluids were given for hydration,  cyclosporine levels were checked and were within normal limits or  subtherapeutic.  Cyclosporine level on Sep 05, 2002, was 196, Sep 08, 2002,  was 135, Sep 09, 2002, was 109, with normal range being 150 to 350.  Urine  was sent for analysis, and it appeared turbid.  Twenty-five thousand  colonies of Staphylococcus coagulase negative grew out which was methicillin-  resistant.  He did receive a dose of vancomycin for this.  Other cultures  remained negative.  With treatment of infection and removal of infected  Gortex graft, his creatinine began to decrease.  At time of discharge, it  had fallen to 2.3.  He was making good urine, and he was discharged home to  resume his prednisone as previously at 5 mg a day, but his cyclosporine was  decreased to 125 mg b.i.d.  He will follow up with Dr. Mercy Moore on September 27, 2002, in the office.  #3 -  IRON-DEFICIENCY ANEMIA:  His hemoglobin was decreasing during this  hospitalization to a low of 8.9.  Iron studies showed a transferrin  saturation level of 8%, and a ferritin level of 286.  He was treated with  Venofer and received a total of 1 g over four days.  At the time of  discharge, his hemoglobin had risen up to 9.5.   DISCHARGE MEDICATIONS:  1. Prednisone 5 mg daily.  2. Nu-Iron 125 mg b.i.d.  3. Lantus insulin 50 units every morning.  4. Novolog insulin 6 units with each meal plus 3 units at bedtime.  5. Lipitor 30 mg with supper.  6. Multivitamin one daily.  7. Calcium with vitamin D 600 mg one with meals.  8. Prilosec 20 mg every morning.  9. Lasix 80 mg b.i.d.  10.      Allopurinol 100 mg b.i.d.  11.      Diovan 160 mg daily.  12.      Augmentin 500 mg b.i.d. x7 days.    FOLLOWUP:  1. Follow up with Dr. Scot Dock on Sep 18, 2002. 2. Follow up with Dr. Mercy Moore on September 27, 2002.     Nonah Mattes, P.A.  Louis Meckel, M.D.    RRK/MEDQ  D:  11/01/2002  T:  11/02/2002  Job:  [id number]   cc:   Judeth Cornfield. Scot Dock, M.D.  [address]  [place]  [place] [address]  Fax: [phone]    cc:   Judeth Cornfield. Scot Dock, M.D.  [address]  [place]  [place] [address]  Fax: [phone]

## 2010-09-10 NOTE — Op Note (Signed)
NAMECARDERO, QUIZHPI                  ACCOUNT NO.:  [id number]   MEDICAL RECORD NO.:  [id number]          PATIENT TYPE:  INP   LOCATION:  5737                         FACILITY:  [facility]   PHYSICIAN:  Sammuel Hines. Daiva Nakayama, M.D. DATE OF BIRTH:  1966/03/03   DATE OF PROCEDURE:  08/19/2004  DATE OF DISCHARGE:                                 OPERATIVE REPORT   PREOPERATIVE DIAGNOSIS:  Appendicitis.   POSTOPERATIVE DIAGNOSIS:  Appendicitis.   PROCEDURE:  Laparoscopic appendectomy.   SURGEON:  Sammuel Hines. Marlou Starks, M.D..   ANESTHESIA:  General endotracheal.   PROCEDURE IN DETAIL:  After informed consent obtained, the patient was  brought to the operating room and placed in supine position on the table.  After induction of general anesthesia, the patient's abdomen was prepped  with Betadine and draped in the usual sterile manner. The area above the  umbilicus was infiltrated with 0.25% Marcaine. A small incision was made  with a 15 blade knife. This incision was carried down through the  subcutaneous tissue bluntly with a hemostat and [employer] retractors until  the linea alba was identified. The linea alba was incised with a 15 blade  knife and each side was grasped with Kocher clamps and elevated anteriorly.  The preperitoneal space was then probed bluntly with a hemostat until the  peritoneum was opened and access was gained to the abdominal cavity. A 0-  Vicryl purse string stitch was placed in the fascia around the opening. A  Hasson cannula was placed through the opening and anchored in place with the  previous placed Vicryl purse-string stitch. The abdomen was then insufflated  with carbon dioxide without difficulty. A laparoscope was then inserted  through the Hasson cannula and the right lower quadrant was inspected. There  were some omental adhesions to the anterior abdominal wall superiorly, but  the right lower quadrant was free. Just below the umbilicus away from his  previously  transplanted kidney, another area was chosen for placement of a  10 mm port. This area was infiltrated with 0.25% Marcaine. A small incision  was made with a 50 blade knife and the 10 mm port was placed bluntly through  this incision into the abdominal cavity under direct vision. The site just  superior and lateral to the Hasson cannula was also chosen for placement of  a 5 mm port. This area was infiltrated with 0.25% Marcaine. A small stab  incision was measured with a 15 blade knife and a 5 mm port was placed  bluntly through this incision into the abdominal cavity under direct vision.  The laparoscope was then moved to the suprapubic port and a Glassman grasper  and Harmonic scalpel was then used through the other two ports to identify  the appendix and elevate it. The appendix was easy to identify. The  mesoappendix was taken down sharply with the harmonic scalpel. Once the base  of the appendix at its junction with the cecum was identified and cleared of  debris, the appendix was held anteriorly with the [facility] retractor and  with the [employer],  and the laparoscopic GIA-45 stapler with a blue  load was placed through the Hasson cannula across the base of the appendix,  clamped, and was allowed to pass, and then the stapling device was fired,  thereby dividing the base of the appendix between staple lines.  Unfortunately, the way the cecum was connected to the appendix created a  long base. This required two more firings of the stapler in order to safely  come across the edge of the cecum laterally. Once this was accomplished, the  appendix was free, the staple line was hemostatic and healthy appearing.  Laparoscopic bag was placed through the Hasson cannula and the appendix was  placed in the bag, and the bag was sealed. The abdomen was then irrigated  with copious amounts of saline and the bag with the appendix was then  removed with the Hasson cannula through the  supraumbilical port without  difficulty. The fascial defect was closed with the previous Vicryl purse-  string stitch as well as with another interrupted 0-Vicryl stitch. The rest  of ports were then removed under direct vision. The gas was allowed to  escape. Skin incisions were all closed with interrupted 4-0 Monocryl  subcuticular stitches. Benzoin, Steri-Strips, and sterile dressings were  applied. The patient tolerated well. At the end of the case, all needle,  sponge, and instrument counts were correct. The patient was awakened and  taken to the recovery room in stable condition.      PST/MEDQ  D:  08/20/2004  T:  08/20/2004  Job:  [id number]

## 2010-09-10 NOTE — Op Note (Signed)
NAMESEARCY, OOTEN                  ACCOUNT NO.:  [id number]   MEDICAL RECORD NO.:  [id number]          PATIENT TYPE:  OIB   LOCATION:  2899                         FACILITY:  [facility]   PHYSICIAN:  Newt Minion, MD     DATE OF BIRTH:  [birth year]   DATE OF PROCEDURE:  10/21/2004  DATE OF DISCHARGE:                                 OPERATIVE REPORT   PREOPERATIVE DIAGNOSES:  1.  Osteomyelitis, base of the fifth metatarsal.  2.  Osteomyelitis, right great toe with Wagner grade 3 ulcers at both      location.   POSTOPERATIVE DIAGNOSES:  1.  Osteomyelitis, base of the fifth metatarsal.  2.  Osteomyelitis, right great toe with Wagner grade 3 ulcers at both      location.   OPERATION PERFORMED:  1.  Right fifth ray amputation.  2.  Right great toe amputation.   SURGEON:  Newt Minion, M.D.   ANESTHESIA:  General.   ESTIMATED BLOOD LOSS:  Minimal.   ANTIBIOTICS:  1g Kefzol preoperatively.  1 g of vancomycin postoperatively.   DRAINS:  None.   COMPLICATIONS:  None.   TOURNIQUET TIME:  None.   DISPOSITION:  To post anesthesia care unit in stable condition.   INDICATIONS FOR PROCEDURE:  The patient is a 45 year old gentleman with type  2 diabetes, who is status post MRSA infection with osteomyelitis of his  calcaneus.  The patient has resolved his ulcer and surgery and has developed  osteomyelitis at the base of the fifth metatarsal on the left foot and has a  new necrotic right great toe with osteomyelitis.  The patient has failed  conservative care despite antibiotics and wound debridement and dressing  changes and presents at this time for surgical intervention.  The risks and  benefits were discussed including infection, neurovascular injury,  persistent pain, nonhealing of the wound, need for additional surgery.  The  patient states she understands and wishes to proceed at this time.   DESCRIPTION OF PROCEDURE:  The patient was brought to the operating room 1  and  underwent a general anesthetic.  After adequate level of anesthesia  obtained, the patient's bilateral lower extremities were prepped using  DuraPrep and draped into a sterile field.  Attention was first focused on  the left  foot.  A racket incision was made to include the fifth ray and  including the lateral ulcer over the base of the fifth metatarsal.  The  fifth metatarsal and fifth toe were resected in one block of tissue with the  ulcer.  The wound was irrigated with normal saline.  Hemostasis was  obtained.  The wound was closed using a far-near-near-far suture with 2-0  nylon.  Attention was then focused on the right great toe.  A racket  incision was made around just distal to the MTP joint.  The toe was amputate  through the metatarsophalangeal joint.  The wound was irrigated with normal  saline.  There was good viable tissue.  He had a good dorsalis pedis pulse.  The wound was closed using  a vertical mattress suture with 2-0 nylon.  Both  wounds were covered with Adaptic orthopedic sponges, Kerlix and a Coban  dressing.  The patient was extubated and taken to PACU in stable condition.  Plan for IV vancomycin and physical therapy with protected weightbearing on  both lower extremities.  Plan for discharge, once he is safe with  ambulation.       MVD/MEDQ  D:  10/21/2004  T:  10/21/2004  Job:  [id number]

## 2010-09-10 NOTE — Op Note (Signed)
Dale [name], Dale [name]                  ACCOUNT NO.:  [id number]   MEDICAL RECORD NO.:  [id number]          PATIENT TYPE:  AMB   LOCATION:  [place]                          FACILITY:  [facility]   PHYSICIAN:  Youlanda Mighty. Sypher, M.D. DATE OF BIRTH:  1966/01/25   DATE OF PROCEDURE:  08/11/2005  DATE OF DISCHARGE:                                 OPERATIVE REPORT   PREOPERATIVE DIAGNOSIS:  Severe chronic entrapment neuropathy, right median  nerve, at carpal tunnel with associated insulin-dependent diabetes, chronic  gout, history of renal transplant for end stage renal disease on  immunosuppressive medications.   POSTOPERATIVE DIAGNOSIS:  Severe chronic entrapment neuropathy, right median  nerve, at carpal tunnel with associated insulin-dependent diabetes, chronic  gout, history of renal transplant for end stage renal disease on  immunosuppressive medications.   OPERATION:  Release of right transverse carpal ligament, right wrist   OPERATIONS:  Theodis Sato, M.D.   ASSISTANT:  Leverne Humbles, P.A.-C.   ANESTHESIA:  IV regional at proximal brachial level utilizing a double  tourniquet at 300 mmHg, supervising anesthesiologist is Dr. Ola Spurr.   INDICATIONS:  Dale [name], Dale [name]., is a 45 year old gentleman referred by Dr.  Fleet Contras of the [facility] for evaluation and  management of chronic right hand pain and numbness.   Dale [name] has a history of insulin-dependent diabetes, end-stage renal  failure, renal transplantation, chronic immunosuppression and chronic gout.  He is managed on long-term allopurinol and colchicine.   He presented with right hand numbness.  Electrodiagnostic studies were  completed, revealing severe right carpal tunnel syndrome.   Due to a failure to respond to nonoperative measures, he is brought to the  operating room at this time for release of his right transverse carpal  ligament.   Given his multiple medical problems, after anesthesia  consultation with Dr.  Ola Spurr, IV regional at proximal brachial level was determined to be the  appropriate anesthetic choice.   After informed consent, he is brought to the operating room at this time.   PROCEDURE:  Dale [name]., is brought to the operating room and placed in  supine position on the operating table.   Following light sedation, the right arm was anesthetized by an Esmarch wrap  and inflation of a double tourniquet on the proximal brachium at 300 mmHg.   Forty-five cc of 1/2% lidocaine without epinephrine were infused into his  dorsal veins using a forearm tourniquet.   After 10 minutes, excellent anesthesia was achieved.   While awaiting full anesthesia, the right arm was prepped with Betadine soap  solution and sterilely draped.  At 10 minutes, excellent anesthesia was  achieved.   Procedure commenced with a short incision in the line of the ring finger in  the palm.  Subcutaneous tissue were carefully divided revealing the palmar  fascia.  This was split longitudinally to reveal an abundant fat pad in the  mid palmar space.  The fat was carefully dissected until the superficial  palmar arch was protected and the distal margin of the transverse carpal  ligament identified.   Placing  a Soil scientist over the common sensory branch of the median nerve  for protection, the distal margin of the transverse carpal ligament was  released with scissors, extending proximally along its ulnar border.   The ulnar bursa was found be extremely fibrotic and infiltrated with  crystals, most likely to be tophaceous in nature.  The transverse carpal  ligament was released along its ulnar border into the distal forearm, and  the volar forearm fascia was released subcutaneously.   This widely opened carpal canal by more than 12 mm of gap at the released  ligament.  The contents of the canal were under considerable pressure due to  swelling of the ulnar bursa.   Bleeding  points were cauterized with bipolar current followed by repair of  the skin with intradermal 3-0 Prolene suture.   A compressive dressing was applied with a volar plaster splint, maintaining  the wrist in 5 degrees of dorsiflexion.   Dale [name] was awakened from sedation and after appropriate time his  tourniquet released.  He was observed closely by our C.R.N.A. and  transferred to the recovery room with stable vital signs.   He will be discharged with a prescription for Vicodin 5 mg 1 p.o. q.4-6h.  p.r.n. pain 20 tablets without refill.  He has an extensive medical regimen  per Dr. Mercy Moore that he will continue to follow on a routine basis.  He  will continue to monitor his glucose and use Lantus insulin and a sliding  scale Novolin insulin administration as prescribed by Dr. Mercy Moore.   There were no apparent complications.      Youlanda Mighty Sypher, M.D.  Electronically Signed     RVS/MEDQ  D:  08/11/2005  T:  08/11/2005  Job:  [id number]   cc:   Windy Kalata, M.D.  Fax: [phone]

## 2010-09-10 NOTE — Discharge Summary (Signed)
NAME:  FREDERICH, STELLHORN                            ACCOUNT NO.:  [id number]   MEDICAL RECORD NO.:  [id number]                   PATIENT TYPE:  INP   LOCATION:  5525                                 FACILITY:  [facility]   PHYSICIAN:  James L. Deterding, M.D.            DATE OF BIRTH:  [birth year]   DATE OF ADMISSION:  08/21/2003  DATE OF DISCHARGE:  08/27/2003                                 DISCHARGE SUMMARY   ADMITTING DIAGNOSES:  1. Right heel fasciitis with history of osteomyelitis with chronic Wagner     grade III ulcer, status post calcaneal excision on June 20, 2003 by     Dr. Sharol Given.  2. Chronic renal insufficiency, status post cadaveric renal transplant in     1995.  3. Diabetes mellitus type 1 since age 47.  86. Hypertension.  5. Gout.  6. Right shin cellulitis requiring past hospitalizations.   DISCHARGE DIAGNOSES:  1. Right heel fasciitis secondary to methicillin-resistant Staphylococcus     aureus on intraoperative wound culture.  2. Chronic renal insufficiency, status post cadaveric renal transplant in     1995.  3. Diabetes mellitus type 1 since age 1.  59. Hypertension.  5. Gout.  6. Right shin cellulitis requiring past hospitalizations.   CONSULTS:  Dr. Sharol Given on August 22, 2003.  Recommended irrigation and  debridement.   PROCEDURES:  1. On August 21, 2003, plain films of his right foot compared to March 30, 2003.  MR of right foot revealed extensive osteolysis involving the     calcaneus, extensive soft tissue swelling overlying the calcaneus, old     healed fracture involving the base of the first metatarsal, old fracture     involving the head of the third metatarsal with healing, and erosion     involving the head of the third metatarsal, and a fracture involving the     posterior aspect of the calcaneus.  2. On August 22, 2003, Dr. Sharol Given irrigated and debrided right heel; visualized     no abscess, but only clear serosanguineous drainage, and recommended IV  antibiotics until intraoperative wound culture returned.  3. On August 22, 2003, chest x-ray due to right heel infection revealed no     active disease, mild scarring in the left lower lobe.  4. On Aug 24, 2003, VQ scan due to chest pain and low O2 saturation showed no     pulmonary embolus evidence.  5. On Aug 27, 2003, PICC line in the left upper arm placed for home IV     antibiotics.   BRIEF HISTORY:  [name] is a 45 year old white man who has had kidney  failure due to diabetes mellitus type 1, now status post cadaveric renal  transplant in 1995, with the most recent creatinine of 2, and BUN of 32,  with cyclosporin 91, as of July 14, 2003.  Was recently admitted on  August 21, 2003 due to an erythematous, swollen right heel that Dr. Sharol Given has been  following because of osteomyelitis.  Felt at that time that it needed  irrigation and debridement, as well as IV antibiotics; therefore, he was  admitted on August 21, 2003 for those very things.  However, on the day of  presentation to our office at [facility] on the day of admission, the  patient also complained of nausea, vomiting, and loose stools, felt probably  secondary to right heel fasciitis versus gastroenteritis.  Therefore, Flagyl  IV was indicated at that time, as well.   HOSPITAL COURSE:  1. Right heel fasciitis.  It was obvious on exam at [facility] the day     of admission that this heel was infected; however, Dr. Sharol Given reports in     his operative note that there was no pus, but only serosanguineous fluid.     However, this fluid grew out MRSA, which would need a long course of     vancomycin.  He was started on vancomycin immediately, as well as Fortaz     2 gm IV daily until blood culture results returned, and, as stated above,     only the wound culture was positive.  Blood cultures had been negative.     Unfortunately, the day or so after he was admitted.  He began having     chest pain and decreased O2  saturation.  Therefore, a VQ scan was ordered     to rule out PE, and thankfully that was indeed ruled out.  As stated     above in the brief history, the patient, upon presentation to our office     at [facility] the day of admission, complained of nausea, vomiting,     and diarrhea.  Empiric Flagyl was started and given, as well as anti-     emetics, and anti-diarrheals.  He continued to feel nauseated, with fever     and chills, until hospital day #4.  By hospital day #6, he was feeling     much better.  Stools began to form by hospital day #2.  Flagyl was     discontinued on hospital day #4.  As stated before, when blood cultures     returned negative, [name] was discontinued.  The vancomycin was obviously     continued because of the wound culture results.  2. Chronic renal insufficiency, status post cadaveric renal transplant in     1995.  Admission creatinine was 2.7, BUN 51, phosphorus 2.5, calcium 8.5,     sodium 134, potassium 4.1, CO2 of 23.  IV Lasix upon admission to the     floor on August 21, 2003 was discontinued.  Avapro was held because of low     blood pressures, and the Avapro was held throughout the remainder of the     hospitalization.  However, Lasix was restarted on hospital day #4, until     on hospital day #5 was increased to 160 q.8h. x48 hours by Deterding, and     then discharged on Lasix 80 p.o. b.i.d.  By the day of discharge,     creatinine was 2.5, BUN 38, calcium 8.1, sodium 138, potassium 3.9, CO2     of 21.  His weight upon admission was 252 pounds.  The last weight that I     have is on Aug 26, 2003, which was 115 kg.  Cyclosporin 100 mg in the a.m.  and 125 mg in the p.m. were continued, as was prednisone 5 mg q.a.m.  3. Diabetes mellitus type 1.  Blood glucose checked q.a.c. and h.s.  Covered     with regular sliding scale insulin, as well as his home doses of Actos     and Lantus; however, it was clear that his glucose had not been well     controlled.  A hemoglobin A1C was checked, and this was found to be at     7.4.  Lantus and Actos were slowly titrated up.  The average a.m. CBG's     about 220.  The average p.m. CBG's low to mid-300's.  4. Hypertension.  As stated previously, Avapro had been held for several     days of admission due to low blood pressures, and finally discontinued;     the patient understood to not take this upon discharge on Aug 27, 2003.     Amiloride 5 mg was continued every a.m.  The average systolic blood     pressure was about 120.  5. Anemia.  Upon admission, the patient's hemoglobin was 10.8; however, it     dipped down to 7.1.  Iron studies checked, which revealed an iron less     than 10.  Therefore, Aranesp 100 mcg subcutaneous given x1 in the     hospital, and thereafter was to be given q week.   DISCHARGE MEDICATIONS:  1. Vancomycin 1.5 gm via PICC line q.48h. for the next 6 weeks.  2. Cyclosporin 100 mg q.a.m., 125 mg q.p.m.  3. Prednisone 5 mg p.o. q.a.m.  4. Amiloride 5 mg p.o. q.a.m.  5. Allopurinol 100 mg p.o. b.i.d.  6. Lipitor 20 mg p.o. q.p.m.  7. Actos 45 mg q.a.m.  8. Lasix 80 mg p.o. b.i.d.  9. Lantus 65 units in a.m.  10.      Colace 200 mg p.o. q.h.s.  11.      Phenergan 12.5 mg 1-2 tablets p.o. q.6-8h. p.r.n. nausea.  12.      Vicodin 5/500, 1-2 tablets p.o. q.4h. p.r.n. pain.   ACTIVITY:  Per Dr. Sharol Given.   DIET:  Low-salt, low-fat, ADA diet.   WOUND CARE:  Per Dr. Sharol Given.   FOLLOW UP:  1. To call Dr. Jess Barters office for a hospital follow up appointment.  2. Dr. Mercy Moore on Sep 02, 2003 at 1:30 p.m.,  and Sep 01, 2003 at 11 a.m.     for blood work.  3. [facility] to call patient regarding Aranesp injections q week.      Delray Alt, PA                   Joyice Faster. Deterding, M.D.    MJG/MEDQ  D:  09/01/2003  T:  09/02/2003  Job:  [id number]   cc:   Newt Minion, M.D.  Fax: [phone]   Windy Kalata, M.D.  [place]  [place] [address]   Fax: [phone]

## 2010-09-10 NOTE — Consult Note (Signed)
Dale [name], Dale [name]                  ACCOUNT NO.:  [id number]   MEDICAL RECORD NO.:  [id number]          PATIENT TYPE:  INP   LOCATION:  5737                         FACILITY:  [facility]   PHYSICIAN:  Maudie Flakes. Hassell Done, M.D.   DATE OF BIRTH:  1966-01-13   DATE OF CONSULTATION:  DATE OF DISCHARGE:                                   CONSULTATION   ADDENDUM:   HOSPITAL MEDICATIONS:  Cefoxitin 1 g IV q.12h.      RFF/MEDQ  D:  08/19/2004  T:  08/20/2004  Job:  [id number]

## 2010-09-10 NOTE — Op Note (Signed)
[facility]  Patient:    Dale [name]                          MRN: [id number] Proc. Date: 05/04/99 Adm. Date:  [id number] Attending:  Layne Benton CC:         Youlanda Mighty. Luisa Dago., M.D. (2)             Duane Lope. Mcarthur Rossetti, M.D.                           Operative Report  PREOPERATIVE DIAGNOSIS:  Locked stenosing tenosynovitis, left long finger at A-1 pulley, associated with longstanding gout.  POSTOPERATIVE DIAGNOSIS:  Locked stenosing tenosynovitis, left long finger at A-1 pulley, associated with longstanding gout.  OPERATION PERFORMED:  Release of left long finger A-1 pulley.  SURGEON:  Youlanda Mighty. Sypher, Brooke Bonito., M.D.  ASSISTANT:  Judith Part. Chabon, P.A.  ANESTHESIA:  1% lidocaine and 0.25% Marcaine metacarpal head level block of left long finger.  ANESTHESIOLOGIST:  Dr. Al Corpus.  INDICATIONS FOR PROCEDURE:  [name] is a 45 year old who has had longstanding renal failure, status post renal transplant with chronic gout.  He has had difficulties with chronic stenosing tenosynovitis affecting his left long finger unresponsive to splinting, activity modification, anti-inflammatories.  Due to failure to respond to nonoperative management, the patient is brought to the operating room at this time for release of his left long finger A-1 pulley.  DESCRIPTION OF PROCEDURE:  [name] was brought to the operating room and placed in supine position on the operating table.  Following injection of 0.25% Marcaine and 1% lidocaine at the metacarpal head level, anesthesia was satisfactory in the left long finger.  The arm was then prepped with Betadine soap and solution and  sterilely draped.  Following exsanguination of the limb with an Esmarch bandage, the arterial tourniquet was inflated to 270 mmHg.  The procedure commenced with an oblique incision in the line of the distal palpar crease directly over the palpably enlarged A-1  pulley.  The subcutaneous tissues were remarkable for dystrophic calcification in the fascia of the pretendinous fibers to the long finger. This was resected.  The A-1 pulley was isolated and noted to be covered with a considerable amount of inflammatory tenosynovium.  This tenosynovium was cleared and the A-1 pulley released.  A small A-0 pulley was released proximally. Thereafter, full active range of motion of the finger was recovered.  There was  some residual crepitation due to significant swelling of the flexor tendons.  No other masses or predicaments were noted.  [name] demonstrated full active range of motion of his finger.  The wound was repaired with mattress sutures of 5-0 nylon.  A compressive dressing was applied with Xeroflo, sterile gauze and Coban. The patient was transferred to the recovery room with stable vital signs.  He will be discharged with a prescription for Tylenol with codeine #3 one or two tablets p.o. q.4-6h. p.r.n. pain, total of 20 tablets without refill. DD:  05/04/99 TD:  05/04/99 Job: [id number]

## 2010-09-10 NOTE — H&P (Signed)
NAME:  [name], Dale                            ACCOUNT NO.:  [id number]   MEDICAL RECORD NO.:  [id number]                   PATIENT TYPE:  INP   LOCATION:  5011                                 FACILITY:  [facility]   PHYSICIAN:  James L. Deterding, M.D.            DATE OF BIRTH:  [birth year]   DATE OF ADMISSION:  08/21/2003  DATE OF DISCHARGE:                                HISTORY & PHYSICAL   CHIEF COMPLAINT:  Nausea, vomiting and fever.   HISTORY OF PRESENT ILLNESS:  [name] is a 45 year old white man who had  kidney failure due to diabetes mellitus, type 1.  He is status post  cadaveric renal transplant in 1995, with the most recent creatinine of 2,  BUN 23, cyclosporin 91 as of July 14, 2003.  He began hemodialysis in  September 2001, transplanted in 1995 and has done fairly well since 1995,  except right heel osteomyelitis that Dr. Newt Minion has been following.  Last visit with Dr. Sharol Given was August 19, 2003.  His followup is in 1 month.   Today, [name] presents to the office with a 24-hour history of nausea,  vomiting, chills, headache, low back pain and fever (as high as 102.7  today).  Today, he had a few brown loose stools.  He has been taking Allegra  x1 week for watery, burning eyes but denies sore throat, cough, nasal  congestion, dysuria, hematuria, change in urination, black tarry or bloody  stools or abdominal pain.   PAST MEDICAL HISTORY:  1. End-stage renal disease secondary to diabetes mellitus, type 1.  2. Currently, renal insufficiency, status post cadaveric renal transplant in     1995.  3. Diabetes mellitus, type 1, diagnosed at age 45.  61. Hypertension.  5. Gout.  6. Osteomyelitis, right calcaneus, with chronic Wagner grade 3 ulcer, status     post partial calcaneal incision, June 20, 2003, by Dr. Sharol Given.  7. Cellulitis, right lower extremity.   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  Denies tobacco,  alcohol or drug use.   FAMILY HISTORY:  Mother alive and well.  Father alive with a history of  diabetes mellitus, type 2.   ALLERGIES:  1. DILAUDID.  2. VASOTEC.  3. AUGMENTIN.   MEDICATIONS:  1. Actos 30 mg p.o. q.a.m.  2. Avapro 150 mg p.o. nightly.  3. Lipitor 20 mg p.o. nightly.  4. Lasix 80 mg p.o. b.i.d.  5. Neoral 100 mg p.o. q.a.m. and 125 mg p.o. q.p.m.  6. Prednisone 5 mg p.o. q.a.m.  7. Amiloride 5 mg p.o. q.a.m..  8. Allopurinol 100 mg p.o. b.i.d.  9. Lantus 46 units subcu q.a.m.   PHYSICAL EXAM:  VITAL SIGNS:  Blood pressure sitting 80/60, lying 100/60;  temperature 100.6; heart rate 74.  GENERAL APPEARANCE:  Ill-appearing white male, however, in no acute  distress.  HEENT:  Atraumatic, normocephalic.  TMs clear.  Pupils equal, reactive and  round to light and accommodation.  Nares patent.  Throat erythematous.  NECK:  No lymphadenopathy.  Supple.  CARDIAC EXAM:  Regular rate and rhythm without clicks, gallops or rubs.  PULMONARY:  Clear to auscultation bilaterally, without wheezes, rhonchi or  rales.  EXTREMITIES:  Trace pretibial edema; right shin erythematous; right heel x3  bigger than left heel with open plantar surface and surrounding erythema,  currently draining.   ASSESSMENT/PLAN:  1. Right heel fasciitis with history of right calcaneal osteomyelitis.  Dr.     Jess Barters office is aware, however, when arriving to the floor, we will need     call answering service at [phone].  We will provide intravenous     antibiotics and plain films.  Will need an I&D more than likely tonight.     We will check blood cultures x2, CBC with differential, PT, PTT.  2. Nausea, vomiting and loose stools, probably secondary to #1 versus     gastroenteritis.  Antiemetics p.r.n., Imodium p.r.n., Flagyl intravenous.  3. Status post cadaveric renal transplant.  We will check a renal panel     tonight and in the morning.  4. Hypertension.  We will hold antihypertensives if  systolic blood pressure     is less than or equal to 110.  5. Diabetes mellitus, type 1.  Sliding-scale insulin, diabetic medications     and diet.   This plan has been discussed with Dr. Windy Kalata and  Dr. Joyice Faster.  Deterding.     Delray Alt, PA                   Joyice Faster. Deterding, M.D.    MJG/MEDQ  D:  08/21/2003  T:  08/22/2003  Job:  [id number]   cc:   Newt Minion, M.D.  Fax: [facility]

## 2010-09-10 NOTE — H&P (Signed)
NAMEBRASON, CHASON                            ACCOUNT NO.:  [id number]   MEDICAL RECORD NO.:  [id number]                   PATIENT TYPE:  INP   LOCATION:  3020                                 FACILITY:  [facility]   PHYSICIAN:  Louis Meckel, M.D.        DATE OF BIRTH:  November 12, 1965   DATE OF ADMISSION:  09/03/2002  DATE OF DISCHARGE:                                HISTORY & PHYSICAL   HISTORY OF PRESENT ILLNESS:  The patient is a 45 year old white male with  past medical history significant for end-stage renal disease.  He is status  post cadaveric renal transplant done in 1995.  His other past medical  history includes hypertension, hyperlipidemia, and diabetes mellitus.  He  had recently had trouble with foot ulcer on his right heel and is status  post work on that.  He now presents with a 24- to 36-hour history of  tenderness of an old A-V graft site in the right thigh and really all of his  right lower extremity.  He has had temperatures up to 101.7 with chills.  He  denies any other focal symptoms including cough, diarrhea, abdominal pain,  dysuria, or pain at the allograft site.  It should be noted that he had some  mild chronic allograft nephropathy and his last creatinine was 1.7.   MEDICATIONS:  1. Micro-K 80 mEq b.i.d.  2. Diovan 160 mg per day.  3. Allopurinol 100 mg b.i.d.  4. Lipitor 30 mg a day.  5. Lasix 80 mg b.i.d.  6. Neoral 150 mg b.i.d.  7. Prednisone 5 mg daily.  8. Lantus insulin 46 units daily.  9. Amiloride 5 mg per day.  10.      Multivitamin.  11.      Vitamin E.  12.      Colchicine p.r.n.   ALLERGIES:  DILAUDID and VASOTEC.   SOCIAL HISTORY:  No tobacco and no alcohol.   FAMILY HISTORY:  Noncontributory.   REVIEW OF SYSTEMS:  Positive for fevers, chills, night sweats, and right leg  pain.  No dysuria, no nausea or vomiting, no cough, no shortness of breath,  no chest pain.  Otherwise, review of systems is negative.   PHYSICAL  EXAMINATION:  VITAL SIGNS:  Temperature 101.2, blood pressure  118/78, heart rate 102, respirations normal.  GENERAL:  He is a mildly-ill-appearing white male in mild distress secondary  to leg pain.  HEENT:  Pupils are equally round and reactive to light.  Extraocular motions  are intact.  Mucous membranes are moist.  NECK:  There is no lymphadenopathy.  LUNGS:  Clear to auscultation bilaterally without wheezes.  CARDIOVASCULAR:  Regular tachycardic without murmur.  ABDOMEN:  Positive bowel sounds, soft and nontender.  The allograft is  nontender.  EXTREMITIES:  There is a right heel ulcer on the lateral aspect.  It is  about the size of a pencil eraser and has scarring  around it.  It does not  look to be overtly infected and does not have any drainage.  His entire  right lower extremity is edematous and erythematous.  In the right thigh  region there is an area of an old A-V graft.  There is a palpable nodule  there that is very painful to touch.  The erythematous region stretches up  to his groin area.  There is no drainage.   LABORATORY DATA:  Pending.   ASSESSMENT:  16. A 45 year old white male who is status post cadaveric renal transplant     and is now in an immunosuppressed state.  He now has cellulitis of his     right lower extremity, possibly affecting an old A-V graft.  Plan:  Admit     the patient to 5100.  Blood cultures x2 were taken in the [place] office.  He will be started on Ancef at 2 grams IV q.12h. and     will be observed.  We will watch this area of old A-V graft closely.  We     will get CVTS involved if infection does not clear with antibiotics.  2. Hypertension:  Will continue home medications.  3. Diabetes:  Accu-Checks and continue home medicines.  4. Immunosuppression:  I will continue his Neoral and prednisone and a     creatinine will be checked.                                               Louis Meckel, M.D.     KAG/MEDQ  D:  09/03/2002  T:  09/04/2002  Job:  [id number]

## 2010-09-10 NOTE — H&P (Signed)
[place]. [facility]  Patient:    Dale [name], Dale [name]                         MRN: [id number] Adm. Date:  [id number] Disc. Date: [id number] Attending:  Roney Jaffe D                         History and Physical  REASON FOR ADMISSION:  Chest pain.  HISTORY OF PRESENT ILLNESS:  The patient is a 45 year old white male with diabetes, non-insulin type 1, and end-stage renal disease, status post renal transplant, 1995, with stable creatinine of 2.0.  He works as a Education administrator.  At work today he developed unexpectedly symptoms of generalized weakness with some tingling in his left hand.  Several minutes later he felt a bit light-headed and nauseated.  He sat down and then developed substernal chest tightness.  EMS was called and came in and brought the patient to the [facility] Emergency Room.  He was admitted and started on oxygen on the scene and en route and one aspirin.  He refused nitroglycerin because they told him that he might get a headache.  Pain had resolved nearly completely on arrival to the emergency room and then shortly thereafter, was completely resolved. Total duration is estimated at 30-45 minutes.  He did not have any diuresis. He may have had some mild shortness of breath.  He denies any prior history of angina, chest pain, heart murmur, heart problems, or coronary artery disease. He did have a stress test in 1995 by [facility] as part of a pretransplant evaluation.  PAST MEDICAL HISTORY:  1. ESRD, did hemodialysis, then peritoneal dialysis, each for one year, then     had a renal transplant at [facility] in 1995 based on creatinine 2.0 on     Neoral and prednisone.  2. Diabetes mellitus, type 1, on insulin pump, followed by Dr. London Pepper.  3. Hypertension.  4. Anemia, not requiring erythropoietin.  5. Hyperlipidemia.  6. Nephrotic range of proteinuria treated with _________ .  MEDICATIONS:  1. Lasix 160 b.i.d. to q.i.d.  2.  Neoral 100 b.i.d.  3. Prednisone 5 q.d.  4. Lipitor 20 q.d.  5. Cardizem CD 180 q.d.  6. Clonidine 0.2 b.i.d.  7. Kay-Ciel 5 mEq at breakfast and lunch and 6 mEq at dinner and at night, a     total of 22 mEq daily.  8. Colchicine p.r.n.  9. Allopurinol 100 mg q.d. 10. Insulin pump with Humalog. 11. _________ 150 mg q.d.  ALLERGIES:  None.  SOCIAL HISTORY:  No tobacco.  Occasional alcohol.  He works as a Occupational psychologist.  FAMILY HISTORY:  Father is alive at age 105 with type 2 diabetes.  Mother alive at age 45, healthy.  One brother healthy.  No history of early heart disease in the family.  REVIEW OF SYSTEMS:  GENERAL:  No fever, chills, sweats.  He has had 20 pound weight loss since starting the insulin pump six months ago.  ENT:  No visual or hearing changes or sore throat or difficulty swallowing.  PULMONARY:  Mild shortness of breath as above, no hemoptysis, purulent sputum production, or history of pneumonia.  CARDIAC:  As above.  GASTROINTESTINAL:  As above. GENITOURINARY:  No change in _________ , dysuria, or history of urinary tract infections.  MUSCULOSKELETAL:  No history of arthritis or acute joint complaints.  NEUROLOGIC:  No  history of _________ or seizure in the past.  PHYSICAL EXAMINATION:  VITAL SIGNS:  Blood pressure 157/80, temperature 98, respirations 18, pulse 72.  GENERAL:  This is an alert, obese white male in no distress.  SKIN:  No rash or cyanosis.  NECK:  No JVD.  CHEST:  Clear.  HEART:  Without murmur or gallop.  ABDOMEN:  Soft, nontender, with active bowel sounds.  There is a mild small periumbilical hernia which is easily reducible.  No organomegaly.  Old scars from previous peritoneal dialysis catheter.  EXTREMITIES:  There is 2+ ankle edema bilaterally.  There are scars from an old graft in the left forearm and the right groin.  NEUROLOGIC:  Alert and oriented x 3, grossly nonfocal.  No asterixis.  LABORATORY:  ISTAT:  Sodium 141,  potassium 5.6, BUN 51, creatinine 2.0.  White blood count 9000, hematocrit 35%.  CPK 74, troponin 0.01.  EKG normal sinus rhythm with no acute changes.  Rate 66.  IMPRESSION: 1. Chest pain episode, new onset with multiple risk factors.  Will admit.    Rule out MI, preserve on telemetry until O2 and cardiology consult.  Will    obtain cardiology consult. 2. Renal transplant with stable creatinine on cyclosporine. 3. Hypertension controlled on _________ , clonidine, and Cardizem. 4. Diabetes mellitus, type 1, on insulin pump. DD:  10/13/00 TD:  10/15/00 Job: [id number]

## 2010-09-10 NOTE — H&P (Signed)
Dale [name]. Dale [name]  Patient:    Dale [name], Dale [name]                         MRN: [id number] Adm. Date:  [id number] Disc. Date: [id number] Attending:  Charlette Caffey                         History and Physical  HISTORY OF PRESENT ILLNESS:  This was a planned outpatient surgical admission of this 45 year old white male, insulin dependent diabetic, admitted for chronic vitreous hemorrhage of the right eye with proliferative diabetic retinopathy.  This patient has a long history of insulin dependent diabetes mellitus and secondary complications of proliferative diabetic retinopathy. The patient has had previous panretinal laser photocoagulation to both eyes, and a pars plana vitrectomy of the left eye in 1992. Since that time the left eye has not continued to hemorrhage, and has maintained a good vision of 20/25. Recently, however, the right eye has developed a vitreous hemorrhage of one to two months duration reducing his vision to finger counting initially without improvement better than 20/100. The patient has elected to proceed with similar vitrectomy surgery, at the present time.  PAST MEDICAL HISTORY: 1. Diabetes mellitus, insulin dependent type. 2. The patient has had previous renal transplantation.  The patient is in a stable general condition, at the present time, under the care of his regular physician, Dr. Genelle Bal and Dr. Reynold Bowen. The patient is felt to be in a fair general health, and slight increase, but not prohibitive risk for the proposed surgery. Dr. London Pepper knows the patients multiple medications including Lasix, _____, colchicine, NPH and regular insulin.  REVIEW OF SYSTEMS:  No cardiorespiratory complaints.  PHYSICAL EXAMINATION:  VITAL SIGNS:  As recorded on admission. Blood pressure 111/68, pulse 80, respirations 16, temperature 98.3.  GENERAL APPEARANCE:  The patient is a husky, well-developed, well-nourished white  male in acute ocular distress.  HEENT:  Eyes; visual acuity as noted above. Slit lamp examination normal. Fundus examination; right eye dense vitreous hemorrhage, no retinal details seen. Left eye clear vitreous, attached retina with panretinal laser photocoagulation.  CHEST AND LUNGS:  Clear to percussion and auscultation.  HEART:  Normal sinus rhythm. No cardiomegaly, no murmurs.  ABDOMEN:  Negative.  EXTREMITIES:  Negative.  ADMITTING DIAGNOSES: 1. Chronic and recurrent vitreous hemorrhage right eye. 2. Insulin dependent proliferative diabetes mellitus. 3. Proliferative diabetic retinopathy.  SURGICAL PLAN:  Posterior vitrectomy through pars plana using vitreous infusion suction cutter with associated endolaser panretinal photocoagulation and possible membrane peeling. The patient has signed an informed consent and arrangements made for his outpatient admission at this time. DD:  02/08/00 TD:  02/08/00 Job: [id number]

## 2010-09-10 NOTE — Op Note (Signed)
[place]. [facility]  Patient:    Dale [name], Dale [name]                         MRN: [id number] Proc. Date: 07/07/00 Adm. Date:  [id number] Attending:  Isaac Laud                           Operative Report  PREOPERATIVE DIAGNOSIS:  Chronic stenosing tenosynovitis, right long and small fingers at A1 pulley.  POSTOPERATIVE DIAGNOSIS:  Chronic stenosing tenosynovitis, right long and small fingers at A1 pulley.  OPERATION PERFORMED: 1. Release of right long finger A1 pulley. 2. Release of right small finger A1 pulley.  SURGEON:  Youlanda Mighty. Sypher, Brooke Bonito., M.D.  ASSISTANT:  Julian Reil, P.A.  ANESTHESIA:  0.25% Marcaine and 2% lidocaine metacarpal head level block supplemented by IV sedation.  SUPERVISING ANESTHESIOLOGIST:  Dr. Albertina Parr.  INDICATIONS FOR PROCEDURE:  Dale [name] is a 45 year old man who is status post cadaveric renal transplant due to diabetes related renal failure.  He has a history of stenosing tenosynovitis unresponsive to splinting and steroid injection in the past.  Given his history of diabetes and his inability to take nonsteroidal anti-inflammatory medications, we recommended proceeding directly to release of the A-1 pulley of the long and small fingers.  DESCRIPTION OF PROCEDURE:  Dale [name] was brought to the operating room and placed in supine position on the operating table.  Following light sedation, the right arm was prepped with Betadine soap and solution and sterilely draped.  0.25% Marcaine and 2% lidocaine were infiltrated at the metacarpal head level to obtain digital block.  When anesthesia was satisfactory, the arm was exsanguinated with an Esmarch bandage.  An arterial tourniquet was inflated to 240 mmHg.  The procedure commenced with a short transverse incision in the distal palmar crease exposing the A-1 pulley of the long finger.  A limited release of the pretendinous fibers of the palmar fascia was  accomplished.  The pulley was isolated and the neurovascular bundles carefully protected.  The pulley was split with a scalpel and scissors.  This allowed recovery of free range of motion of the finger.  Attention was then directed to the small finger. Due to excessive adipose tissue, a Brunner zigzag incision was fashioned to expose the A0-1 pulley. After a limited release of the palmar fascia, retractors were placed, exposing the A1 pulley.  This was split with scalpel and scissors.  The tendons were delivered and found to be moderately edematous.  Thereafter full range of motion of the small finger was recovered.  The wounds were repaired with interrupted sutures of 5-0 nylon.  A compressive dressing was applied with Xeroflo, sterile gauze and Ace wrap.  There were no apparent complications.  The patient was transferred to the recovery room with stable vital signs.  He will be discharged with a prescription for Tylenol and codeine #3 one to two tablets p.o. q.4-6h. p.r.n. pain. 24 tablets without refill. DD:  07/07/00 TD:  07/07/00 Job: [id number]

## 2010-09-10 NOTE — Op Note (Signed)
NAMEROYER, MAISANO                            ACCOUNT NO.:  [id number]   MEDICAL RECORD NO.:  [id number]                   PATIENT TYPE:  INP   LOCATION:  [id number]                                 FACILITY:  [facility]   PHYSICIAN:  Newt Minion, M.D.                DATE OF BIRTH:  1966/01/05   DATE OF PROCEDURE:  08/22/2003  DATE OF DISCHARGE:                                 OPERATIVE REPORT   PREOPERATIVE DIAGNOSES:  Infection of right heel.   POSTOPERATIVE DIAGNOSIS:  Infection of right heel.   PROCEDURE:  Irrigation and debridement of skin, soft tissue and partial  calcaneal excision.   SURGEON:  Newt Minion, M.D.   ANESTHESIA:  General.   ESTIMATED BLOOD LOSS:  Minimal.   ANTIBIOTICS:  The patient was started preoperatively on vancomycin, Fortaz  and Flagyl.   TOURNIQUET TIME:  None.   SPECIMENS:  Cultures obtained x2.   DISPOSITION:  To PACU in stable condition.   INDICATIONS FOR PROCEDURE:  The patient is a 45 year old gentleman who is  status post a chronic right heel ulcer.  The patient underwent partial  calcaneal incision.  Previous bone cultures were negative for osteomyelitis.  The patient essentially had healed this wound and was seen in the foot  clinic this past Tuesday.  The patient presented to his renal physician two  days after his foot clinic visit, with history of fever, chills and a  painful swollen right foot.  Examination in the hospital showed the patient  to have a redness, cellulitis, warmth with an acute Charcot fracture;  however, the patient did have an elevated white blood cell count and an  elevated temperature.  Radiographs were obtained, which showed no acute  osteomyelitis.  It was felt that due to the patient's systemic symptoms that  irrigation, debridement and partial calcaneal incision was necessary at this  time.  The patient did undergo ankle brachial indices which showed adequate  great toe pressures of 40 mm on the right, 60 mm on  the left.  He did have  biphasic pulses; however, his ankle brachial indices were elevated due to  calcification.  The risks and benefits of surgery were discussed with the  patient, including infection, neurovascular injury, nonhealing of the wound,  need for below-the-knee amputation.  The patient states he understands and  wishes to proceed at this time.   DESCRIPTION OF PROCEDURE:  The patient was brought to OR Room #4 and  underwent a general anesthetic.  After an adequate level of anesthesia  obtained, the patient's right lower extremity was prepped using Duraprep and  draped into a sterile field.  His previous surgical incision over the  posterolateral aspect of the os calsus was lifted out.  Fibrinous tissue was  removed.  There was clear serous drainage; however, there is no purulent  abscess.  Further calcaneal excision was also performed.  The patient had a  developing blister on the medial aspect of the os calsus; this was also  debrided down to the bone.  Soft tissue was removed and cultures were  obtained.  Approximately one inch of the os calsus was removed over the  entire surface of the open wound.  The wound was irrigated with pulse  lavage.  There was good viable tissue on the wound edges. A Penrose drain  was placed through the medial small wound, which measured approximately 10  mm x 10 mm.  However, the posterolateral incision which measured 60 x 20 mm  was closed loosely with 2-0 nylon.  The Penrose drain communicated with both  wounds.  The wound was then covered with Adaptic orthopedic sponges, sterile  Curlex and a Coban dressing.  The patient was extubated and taken to the  PACU in stable condition.   PLAN:  Will continue with his current IV antibiotic regimen until cultures  are finalized to place him on his final antibiotics, which I anticipate he  will need six weeks of antibiotics.                                               Newt Minion, M.D.     MVD/MEDQ  D:  08/22/2003  T:  08/23/2003  Job:  [id number]

## 2010-09-10 NOTE — Discharge Summary (Signed)
Dale [name], Dale [name]                  ACCOUNT NO.:  [id number]   MEDICAL RECORD NO.:  [id number]          PATIENT TYPE:  INP   LOCATION:  5526                         FACILITY:  [facility]   PHYSICIAN:  Dale [name], M.D.DATE OF BIRTH:  1965/08/27   DATE OF ADMISSION:  11/16/2004  DATE OF DISCHARGE:  11/25/2004                                 DISCHARGE SUMMARY   ADMISSION DIAGNOSES:  1.  Left foot infection at left fifth ray amputation site (status post      surgery by Dale [name] October 21, 2004).  2.  History of methicillin resistant Staphylococcus aureus, osteomyelitis of      calcaneus, right great toe and left fifth ray status post amputation of      right great toe and left fifth ray.  3.  History of insulin-dependent diabetes mellitus.  4.  Status post cadaveric renal transplant in 1995.  5.  History of dyslipidemia.  6.  Iron deficiency anemia.   DISCHARGE DIAGNOSES:  Left foot infection secondary to methicillin resistant  Staphylococcus aureus; status post left column excisional incision and  drainage by Dale [name] on November 17, 2004.   CONSULTATIONS:  Dale Minion, MD, November 16, 2004.   PROCEDURE:  1.  Left column excisional incision and drainage by Dale [name] on November 17, 2004.  2.  On November 18, 2004 two units packed red blood cells transfused due to      hemoglobin of 7.6.  3.  On November 23, 2004 peripherally inserted central catheter was placed for      intravenous antibiotics.   HISTORY OF PRESENT ILLNESS:  Briefly, Dale [name] is a 45 year old white male  who was followed in our office by Dale [name] due to end stage  renal disease status post cadaveric renal transplant in 1995.  He presented  on November 16, 2004 to short stay for a scheduled dose of intravenous iron when  he was noted to have a temperature of 101 degrees.  Additionally at that  time, in short stay, he gave a history of fevers at home, chills, dry  heaves, generally feeling poor.  Since November 12, 2004 Dale [name] had [name] him  Clindamycin 300 mg p.o. t.i.d. secondary to an angry-appearing cellulitic  left foot around his amputation site.  He had previously on October 21, 2004  undergone a right great toe and left fifth ray amputation secondary to  osteomyelitis and foot ulcers.  Clindamycin was started due to drainage from  the left foot.  On day of admission, November 16, 2004, after temperature was  noted, Dale [name] was paged and saw Dale [name] and the dressing from his left  foot was removed and a large amount of brownish purulent material, which  Dale [name] said was a definite change over the previous 24 hours.  He was  admitted for intravenous antibiotics and surgical consultation by Dale [name].   HOSPITAL COURSE:  On November 16, 2004, the same as admission, Dale [name] saw  Dale [name] and agreed with intravenous antibiotics and planned for  debridement for the following day, however, disagreed with Dale [name]  recommendation for an MRI and therefore discontinued her order for an MRI.  He felt it MRI would not be that specific for osteomyelitis, [name] the  recent surgery.  Blood cultures were obtained and [name] his history of  methicillin resistant Staphylococcus aureus, vancomycin was started.  Wound  culture was also obtained.  Cipro was started for gram-negative coverage.  Ultimately blood cultures were negative, however, would culture, not  surprisingly grew out methicillin resistant Staphylococcus aureus.  Intravenous access was difficult, therefore a PICC line was inserted for  intravenous antibiotics.  Dale [name] began to feel better within the next 24 to  48 hours, however, did have continued loose stools.  Stool cultures were  negative for Clostridium difficile.  Interestingly, he had several days of  low white counts and [name] his transplant history, fear of Dale [name] was a  possibility, therefore, he was empirically started on Valcyte while Dale [name] test  results were pending.  Glucose was difficult to  control [name] his infection.  Hemoglobin on day of admission was 8.9. His hemoglobin on day of transfusion  was 7.6.  post transfusion hemoglobin was 9.7. On the day of discharge,  November 25, 2004, his hemoglobin was 10.4. He did not receive intravenous iron  during this hospitalization and as stated previously, white counts had  gotten low, as low as 1.9.  Zosyn that had been started empirically, due to  persistent fevers, was stopped.  His allopurinol was stopped.   With regards to his renal transplant, cyclosporine level was checked on November 19, 2004 and this was found to be 212.  His prednisone was continued at 5 mg  every day.  His Neoral was continued at 125 mg b.i.d. which was his  outpatient home dose.  His amiloride 5 mg was continued at p.o. daily as  this was also his outpatient dose.  His Lasix 80 mg p.o. b.i.d. was  continued for fluid control.  His creatinine on day of admission was 2.6.  It got as high as 3.0 but on the day of discharge was 2.3.  Therefore,  renally he remained pretty stable throughout his hospitalization.  Surprisingly, iron stores were found to be disproportionate - Ferritin high  at 703, CRP high at 6.5, percent saturation on the lower end of normal at  21.   DISCHARGE MEDICATIONS:  1.  Lipitor 20 mg p.o. daily.  2.  Cyclosporine 125 mg p.o. b.i.d.  3.  Aranesp 100 mcg q. Monday.  4.  Prednisone 10 mg p.o. daily.  5.  75/25 insulin, 15 units b.i.d.  6.  Actos 30 mg p.o. q.a.m.  7.  Vancomycin 1.25 grams intravenously, q.48h.  8.  Lasix 160 mg p.o. b.i.d.  9.  Allopurinol 100 mg p.o. b.i.d.  10. Valcyte 450 mg p.o. every other day.  11. Patient understood to hold amiloride, Avapro, and Colchicine, to stop      Clindamycin.   ACTIVITY:  As tolerated.   WOUND CARE:  Per Dale [name].   FOLLOW UP:  He will follow up with Dale [name] in five to seven days after  discharge.  He will follow up with Dr. Mercy [name] on November 29, 2004 at 11:30.     Dale Alt, PA    ______________________________  Dale [name], M.D.    MJG/MEDQ  D:  03/10/2005  T:  03/10/2005  Job:  [id number]   cc:   Dale Minion,  MD  Fax: [phone]   Windy Kalata, M.D.  Fax: [phone]

## 2010-09-10 NOTE — Consult Note (Signed)
NAMEEKAM, DERSTINE                  ACCOUNT NO.:  [id number]   MEDICAL RECORD NO.:  [id number]          PATIENT TYPE:  INP   LOCATION:  5737                         FACILITY:  [facility]   PHYSICIAN:  Orlando Penner. Sevier, M.D. DATE OF BIRTH:  [birth year]   DATE OF CONSULTATION:  09/23/2004  DATE OF DISCHARGE:  08/20/2004                                   CONSULTATION   September 23, 2004  RE:  Patient Dale [name]. Belk   TO WHOM IT MAY CONCERN:   Please be advised that [name] has been under the care of the [facility] in the [place], essentially continuously  for the last 12 years.   As you are perhaps aware from other medical statements, [name] has had  type 1 diabetes since age 5 and has had virtually every complication of  that disease. As a result of the disease, he has a significant retinopathy,  renal failure status post renal transplantation and severe peripheral  neuropathy.   The peripheral neuropathy has left him without any protective sensation in  his lower extremities, and as a result he has had in addition the  complication known as diabetic neuroarthropathy (Charcot deformities of the  feet). As a result of these problems, he in turn has significantly deformed  feet which are requiring a special footwear and are extremely prone to  injury from pressure or what to anyone else would be minor trauma. When such  injuries occur, his healing is impaired not only by his diabetic state but  also by the fact that he is on chronic immunosuppressive therapy to protect  his transplanted kidney, which in turn predisposes him to infection and also  impairs any wound healing. He has had numerous foot ulcers, various  situations of foot trauma, and has required repeated care to foot ulcers and  especially infectious complications as well. He has had several surgeries to  his feet, including removal of a portion of the left fifth toe and  metatarsal head, and  also removal of a portion of the heel bone of the right  foot; both these procedures necessitated by infection in sites of previous  ulcerations. He is currently under treatment for an active ulcer laterally  on the left foot, which unfortunately appears now to have begun to involve  the bone in that area as well.  Doubtless he will need eventual surgery  here, after stabilizing things with antibiotic therapy.   As a result of all these problems, as indicated above, [name] requires  special footwear.  He has very unsteady gait and is prone to fall and  injury. He uses a walking cane. From the perspective of his feet, he really  should walk no more than is absolutely necessary for his activities of daily  living.  He should certainly not work in any environment which involves him  being on his feet, (he must be seated at all times) and he certainly must  avoid, insofar as possible, any trauma whatsoever to his feet.   Obviously  these deformities and truly limb threatening circumstances render  him disabled from the point view of his feet, and given the other  complications of his disease, it is the opinion of this physician that he  really is totally and permanently disabled. His efforts at various  occupational undertakings in the past have not been successful, in that they  have tended to complicate his diabetic situation to include his foot  problems.   Thank you for your considerations to [name] in his application for  disability.   Orlando Penner. London Pepper, M.D.  [facility], [place], [place]      RES/MEDQ  D:  09/23/2004  T:  09/23/2004  Job:  [id number]

## 2010-09-10 NOTE — Consult Note (Signed)
NAMEKEIWAN, GIPP                  ACCOUNT NO.:  [id number]   MEDICAL RECORD NO.:  [id number]          PATIENT TYPE:  INP   LOCATION:  5737                         FACILITY:  [facility]   PHYSICIAN:  Maudie Flakes. Hassell Done, M.D.   DATE OF BIRTH:  Oct 23, 1965   DATE OF CONSULTATION:  08/19/2004  DATE OF DISCHARGE:                                   CONSULTATION   HISTORY OF PRESENT ILLNESS:  [name] is a 45 year old Caucasian male with a  history of end stage renal disease secondary to diabetic nephropathy status  post a left cadaveric renal transplant in 1995, insulin dependent diabetes  mellitus, HTN, nephrotic range proteinuria, anemia, and hyperlipidemia, who  presented to the emergency department with a chief complaint of right lower  quadrant pain since awakening this morning.  The pain is described as a  chronic, sharp, cramping pain without radiation and is rated a 10 out of 10  with 10 being the worse.  Movement aggravates the pain.  The patient is  positive for nausea with dry heaves and no vomiting.  The patient also  reported 4-5 episodes of diarrhea yesterday that he treated with Imodium AD  with relief.  No hematochezia or melena.  No fever or chills.  On arrival to  the ED, the patient indicated that he was treated with morphine x 2 and  Dilaudid with minor relief with pain rated an 8 out of 10.   PAST MEDICAL HISTORY:  1.  End stage renal disease secondary to diabetic nephropathy status post      left cadaveric renal transplant in 1995.  2.  Insulin dependent diabetes mellitus since age 66.  3.  Nephrotic range proteinuria treated with Avapro.  4.  Hypertension.  5.  Anemia.  6.  Hyperlipidemia.  7.  Osteomyelitis in the right calcaneus with chronic Wagner grade 3 ulcer      status post partial calcaneal incision.  8.  Gout.  9.  Cellulitis in the right lower extremity.  10. Status post cataract surgery in the left eye.  11. Past history of peritoneal dialysis and  hemodialysis.   MEDICATIONS:  Hospital medications:  1.  Humalog subcutaneous injections on a sliding scale.  2.  Cyclosporin 40 mg IV q.12h. until p.o., then resume the Neoral 125 mg      p.o. b.i.d.  3.  Solu-Medrol 20 mg IV on August 20, 2004, 15 mg on August 21, 2004, 10 mg      on August 22, 2004, then resume prednisone 5 mg p.o. on Aug 23, 2004.   Home medications:  1.  Actos 30 mg daily.  2.  Avapro 150 mg daily.  3.  Lipitor 20 mg daily.  4.  Lasix 80 mg b.i.d.  5.  Neoral 125 mg b.i.d.  6.  Colchicine 0.6 mg daily and p.r.n.  7.  Prednisone 5 mg daily.  8.  NovoLog on sliding scale p.r.n.  9.  Amiloride 5 mg daily.  10. Allopurinol 100 mg b.i.d.  11. Lantus 44 units q.a.m.  12. Aranesp 100 mcg every other  week.  13. Questionable Omnicef 30 mg b.i.d.   ALLERGIES:  1.  Amoxicillin.  2.  Dilaudid.  3.  Vasotec.   REVIEW OF SYMPTOMS:  GENERAL:  No fevers, chills, or fatigue.  HEENT:  No  headaches, rhinorrhea, or sore throat.  NECK:  No masses or lymphadenopathy.  HEART:  No chest pain, palpitations, or shortness of breath.  LUNGS:  No  cough or dyspnea on exertion.  ABDOMEN:  Positive nausea and diarrhea and  right lower quadrant abdominal pain, no vomiting.  MUSCULOSKELETAL:  No back  or joint pain.  NEUROLOGICAL:  No numbness or tingling.  ENDOCRINE:  No  polydipsia or polyuria, positive history of insulin dependent diabetes  mellitus.   FAMILY HISTORY:  Mother alive and healthy.  Father alive with diabetes  mellitus type 2.   SOCIAL HISTORY:  [name] is separated from his wife and resides with his  parents.  He is employed as a Education administrator.  He has no children.  He  denies tobacco, drugs, or alcohol use.   LABORATORY DATA:  CBC revealed white blood cell count 8.3, hemoglobin 11.9,  hematocrit 35.4, platelets 233.  BMP reveals sodium 139, chloride 109, BUN  77, potassium 4.6, CO2 21.5, serum creatinine 2.5 from a baseline of 2.3 in  February 2006, and  glucose 158.   PHYSICAL EXAMINATION:  VITAL SIGNS:  Temperature 97.5, blood pressure 137/78, heart rate 96,  respirations 24, O2 saturation 100% on room air, CBG post surgery 256.  GENERAL:  [name] is a 45 year old male who is pleasant and cooperative.  He appears to be in mild distress due to his right lower quadrant abdominal  pain.  HEART:  Regular rate and rhythm, S1 and S2 distinct, no murmurs, gallops,  and rubs.  LUNGS:  Clear to auscultation bilaterally.  ABDOMEN:  Positive pain upon palpation in the right lower quadrant with  radiation.  Positive rebound in the right lower quadrant as well as the  right upper quadrant.  Positive hyperactive bowel sounds.  EXTREMITIES:  Lower extremities and right calcaneus were unable to be  assessed due to hose that the patient was wearing upon admission to the  hospital to reduce edema as well as bandages status post right calcaneal  incision.   ASSESSMENT/PLAN:  1.  Right lower quadrant abdominal pain secondary to appendicitis.  The      patient was evaluated by surgery and taken to the OR for a laparoscopic      appendectomy.  The patient will be n.p.o. until he has a bowel movement,      then resume all p.o. medications.  2.  End stage renal disease secondary to diabetic nephropathy status post      left cadaveric renal transplant in 1995.  Cephalosporin 40 mg IV q.12h.      until eating by mouth, then resume Neoral 125 mg p.o. b.i.d.  Solu-      Medrol 20 mg IV on August 20, 2004, 15 mg on August 21, 2004, 10 mg on      August 22, 2004, then resume prednisone 5 mg p.o. on Aug 23, 2004.  Follow      serum creatinine and creatinine clearance.  Renal panel in the a.m.  3.  Insulin dependent diabetes mellitus.  Humalog subcutaneous injections on      a sliding scale.  4.  Hypertension, stable.  Hold all p.o. meds until the patient resumes p.o.  5.  Anemia.  Continue Aranesp  injections every other week.  CBC in the a.m.     RFF/MEDQ  D:   08/19/2004  T:  08/19/2004  Job:  [id number]

## 2010-09-10 NOTE — Op Note (Signed)
Dale [name], Dale [name]                  ACCOUNT NO.:  [id number]   MEDICAL RECORD NO.:  [id number]          PATIENT TYPE:  INP   LOCATION:  5702                         FACILITY:  [facility]   PHYSICIAN:  Newt Minion, MD     DATE OF BIRTH:  09-09-1965   DATE OF PROCEDURE:  11/17/2004  DATE OF DISCHARGE:                                 OPERATIVE REPORT   PREOP DIAGNOSIS:  Abscess and osteomyelitis lateral left foot.   POSTOP DIAGNOSIS:  Same.   PROCEDURE:  1.  Lateral left foot column resection.  2.  Excisional debridement of the abscess.  3.  Pulse lavage with normal saline.  4.  Cultures obtained times two.   SURGEON.:  Newt Minion, MD   ANESTHESIA:  General.   ESTIMATED BLOOD LOSS:  Minimal.   ANTIBIOTICS:  Vancomycin and Cipro preoperatively.   DRAIN:  One-quarter inch Penrose.   TOURNIQUET TIME:  None.   DISPOSITION:  To PACU in stable condition.   INDICATIONS FOR PROCEDURE:  The patient is a 45 year old gentleman who is  status post a right great toe amputation as well as a left fifth ray  amputation. The patient has healed the great toe amputation well. The  patient has had breakdown laterally over the left foot fifth ray amputation.  Distal aspect is healed well and he has had acute purulent abscess and he  was admitted yesterday for IV antibiotics and presents at this time for open  irrigation debridement and excision of bone and soft tissue. Risks and  benefits were discussed including infection, neurovascular injury,  nonhealing of the wound, need for higher-level amputation. The patient  states he understands and wished proceed at this time.   DESCRIPTION OF PROCEDURE:  The patient was brought to OR room one and  underwent a general anesthetic. After adequate level of anesthesia obtained,  the patient's left lower extremity was prepped using DuraPrep and draped  into a sterile field. The nonhealing wound was ellipsed out back to bleeding  viable tissue. The  lateral column was resected, essentially resection of the  partial aspect of the calcaneus, cuboid and the fourth metatarsal. The pulse  lavage was used for 3 liters of pulse lavage. There was a purulent abscess  which was debrided and cleansed with a pulse lavage. Cultures were obtained  prior to debridement and pulse lavage. There was good petechial bleeding, no  evidence of abscess, no necrotic tissue after debridement and cleansing. The  wound was closed using a far-near-  near-far suture with 2-0 nylon. There was no tension on the skin. A Penrose  drain was placed deep in the wound.  The wound was covered with Adaptic  orthopedic sponges, Webril and a Coban dressing. The patient was extubated,  taken to PACU in stable condition. Plan for continue antibiotics.       MVD/MEDQ  D:  11/17/2004  T:  11/17/2004  Job:  [id number]

## 2010-09-10 NOTE — H&P (Signed)
Dale [name], Dale [name]                            ACCOUNT NO.:  [id number]   MEDICAL RECORD NO.:  [id number]                   PATIENT TYPE:  INP   LOCATION:  5019                                 FACILITY:  [facility]   PHYSICIAN:  Delray Alt, PA             DATE OF BIRTH:  1966-03-07   DATE OF ADMISSION:  09/24/2002  DATE OF DISCHARGE:                                HISTORY & PHYSICAL   ADMITTING DIAGNOSES:  Sepsis.   HISTORY OF PRESENT ILLNESS:  The patient is a 45 year old white male, status  post cadaveric renal transplant in 1995 with a history of hypertension,  dyslipidemia, and gout.  Recently discharged from [facility] on Sep 13, 2002, because of dry right-heel foot ulcer, right chin  cellulitis, gouty flare up, and right thigh AV graft infection (status post  removal by Dr. Doren Custard on Sep 08, 2002).  Also during recent hospitalization,  had positive urine culture that was finalized the day after discharge on Sep 14, 2002.  This grew coagulase negative Staphylococcus sensitive to  vancomycin.  During his hospitalization, he received Zosyn 2.25 g IV q.8 x1  day, and then it was increased to 3.375 g IV q.12h. X3 days, and changed to  p.o. Augmentin 500 mg b.i.d.  He had negative blood cultures x2 separate  sets.  Orthopedics was consulted because of right knee effusion, joint  aspirated showed no growth.   Today, he presents with fever x24 hours, unknown temperature with chills,  nausea, vomiting x12 hours and new onset low-back pain.  He denies headache,  change in bowel habits, black tarry stools, shortness of breath or pain at  kidney transplant site.  He says he finished Augmentin 500 mg p.o. b.i.d.  that was given to him upon recent discharge.  He followed up with the CVTS  five days ago, and was given a good report.  He says he was recently told by  Dr. Windy Kalata over the phone to start p.o. potassium.  He was to  follow up in our office on  September 27, 2002.   ALLERGIES:  1. Dilaudid.  2. Vasotec.   REVIEW OF SYSTEMS:  See HPI.   SOCIAL HISTORY:  He lives with blind wife.  They have no children.  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  He denies tobacco or illicit drug  use.  He occasionally drinks alcohol.   FAMILY HISTORY:  Noncontributory.   MEDICATIONS:  1. Potassium, unknown dose.  2. Prednisone 5 mg p.o. daily.  3. Neoral 125 mg p.o. b.i.d.  4. Lantus 50 units subcu q.a.m.  5. Nova Log, 6 units subcutaneously, q.a.c., t.i.d. and three units     subcutaneously q.h.s.  6. Multivitamin.  7. Calcium and vitamin E, 600 mg, one tablet p.o. t.i.d. a.c.  8. Prilosec 20 mg p.o. p.r.n. q.a.m.  9. Lasix 80 mg, two tablets p.o. daily.  10.      Allopurinol 100 mg p.o. b.i.d.  11.      Diovan 160 mg p.o. daily.  12.      Augmentin 500 mg p.o. b.i.d.  (finished Sep 20, 2002).   PHYSICAL EXAMINATION:  VITAL SIGNS:  Blood pressure 142/52, heart rate 118,  temperature 104.3, respirations 26.  GENERAL APPEARANCE:  An ill-appearing, obese, white male, alert and oriented  x3.  CARDIAC EXAM:  Tachycardia rates, regular rhythm without rub.  LUNGS:  Decreased breath sounds, no wheeze or rales.  ABDOMEN:  Exam positive bowel sounds, obese, soft, diffusely tender  throughout.  No distension, no rebound.  EXTREMITIES:  Brawny-appearing right shin, +1 pedal pulses, trace edema,  right greater than left shin.  Dry ulcer right lateral heel.  Right groin  retains one stitch.  Right anterior thigh was well demarcated erythema and  actively draining right thigh surgical site.  Serosanguinous turbid-  appearing drainage.  BACK:  Skin is clean, dry and intact.  At back, no erythema at renal  transplant surgical site.  Diffusely tender to moderate palpation.   LABORATORY DATA:  In emergency department, sodium 137, potassium 6.2,  chloride 106, CO2 21, BUN 71, creatinine 2.4.  Glucose 171.  Calcium 8.9.  Total protein 7.6,  albumin 3.  AST 35, ALT 26, amylase 56.  Lipase 27.  Alkaline phosphatase 83, bilirubin total 0.5, phosphorus 2.4, white count  22.0, hemoglobin 10.2, hematocrit 28.6, platelet 363.   Chest x-ray, poor inspiration, no acute disease, pH 7.342, PCO2 38.9,  bicarbonate 21.0, PCO2 is 22.0.  Acid base deficit 4.0.  UA yellow and  clear.  Specific gravity 1.038, pH 6.0, glucose negative.  Hemoglobin  moderate, bilirubin negative, ketones 40.  Protein 30, urobilinogen 0.2,  nitrite negative. Leukocyte esterase trace.  Urine microscopies, white blood  cells 3-6, rbc's 3-6, mucus, and bacteria rare.  Right anterior thigh wound  culture pending.  Blood cultures x2 pending.  Urine culture and sensitivity  pending.   ASSESSMENT AND PLAN:  1. Will admit to 5500 for sepsis, non-telemetry, Dr. Lorrene Reid.  2. CVTS to consult.  3. Zosyn 3.375 g IV q.8h.  4. Vancomycin 2.5 g IV x1.  5. Because of hyperkalemia, will give Kayexalate 30 g PR x1.  Recheck renal     panel later tonight after Kayexalate dose.  We will give a low potassium     diet.  6. Nausea and vomiting.  We will given half normal saline 50 cc IV q.1h.  Reglan 10 mg IV q.6h and Phenergan 25 mg IV/ PR q.4-6h. p.r.n. nausea, and  Protonix 40 mg IV q.24h.  1. Diabetes mellitus.  Carbohydrate-modified diet, clear liquids, insulin     resistant sliding scale.  Given Lantus 50 units subcutaneously q.a.m.,     Nova Log 60 units subcutaneously t.i.d., a.c. and three units     subcutaneously q.h.s.  We will do Accu-Cheks q.a.c. and h.s.  2. MRI with and without infusion, right hip down to right foot.  We will do     the whole right lower-extremity.  3. MRI with and without, L-spine.  Give Percocet 7.5/ 500 mg p.o. q.4-6h     p.r.n. pain.  (hospital no longer carries Tylox), and morphine 1-2 mg IV     q.4-6h. p.r.n. breakthrough pain.  4. Status post cadaveric kidney transplant.  We will continue his Neoral 125    mg p.o. b.i.d. and  prednisone 5 mg  p.o. every day.  We will make sure we     get strict in's and out's.  5.     Recheck chest x-ray, PA and lateral in the a.m.  6. [facility] to fax any notes/ phone orders possibly given by  Dr.     Mercy Moore.  Hopefully will fax that tomorrow morning.                                               Delray Alt, PA    MJG/MEDQ  D:  09/24/2002  T:  09/24/2002  Job:  [id number]   cc:   [facility]   [facility]

## 2010-09-10 NOTE — Op Note (Signed)
NAMESIGMUND, Dale [name]                            ACCOUNT NO.:  [id number]   MEDICAL RECORD NO.:  [id number]                   PATIENT TYPE:  INP   LOCATION:  3020                                 FACILITY:  [facility]   PHYSICIAN:  Judeth Cornfield. Scot Dock, M.D.        DATE OF BIRTH:  12-07-1965   DATE OF PROCEDURE:  09/08/2002  DATE OF DISCHARGE:                                 OPERATIVE REPORT   PREOPERATIVE DIAGNOSES:  Infected right thigh arteriovenous graft.   POSTOPERATIVE DIAGNOSES:  Infected right thigh arteriovenous graft.   OPERATION PERFORMED:  Removal of infected right thigh arteriovenous graft  with vein patch angioplasty of the femoral artery.   SURGEON:  Judeth Cornfield. Scot Dock, M.D.   ASSISTANT:  Lydia Guiles, P.A.   ANESTHESIA:  General.   DESCRIPTION OF PROCEDURE:  The patient was taken to the operating room,  sedated by anesthesia and received a general anesthetic.  The right groin  and thigh were prepped and draped in the usual sterile fashion.  A  transverse incision was made over the distal loop of the graft where the  cellulitis was present and dissection carried down to the graft.  There was  a significant amount of purulent material around the graft at this level.  The graft was not incorporated.  The previous incision in the groin was  opened and through dense scar tissue and with some difficulty, I was able to  dissect out the common femoral artery proximal to the arterial anastomosis  and also the deep femoral artery and superficial femoral artery.  Again  through significant scar tissue I dissected the venous anastomosis down to  where it was anastomosis to the femoral vein.  The patient was then  heparinized.  A Satinsky clamp was placed across the femoral vein and the  venous limb of the graft taken off the vein including a small ellipse of  vein to be used as a vein patch.  The femoral vein was then oversewn with a  5-0 Prolene suture.  The venous  limb of the graft was then brought through  the tunnel without much difficulty.  Next, attention was turned to the  arterial end of the graft.  The arteries were controlled with clamps and  then the entire graft was removed from the femoral artery.  The artery had  moderate thickening.  I was careful to remove the entire remnant of the  graft and then the vein patch which had been saved was sewn using continuous  5-0 Prolene suture.  Prior to completing the anastomosis, the vessels were  backbled and flushed appropriately and the anastomosis completed.  Flow was  re-established to the right leg.  Hemostasis was obtained in the wounds.  Distally two Penrose drains were placed and the wound was packed open.  In  the groin, the deep layer was closed with 2-0 Vicryl.  The subcutaneous  layer with 2-0  Vicryl and then the skin closed loosely with interrupted 3-0  nylons.  A sterile dressing was applied, the patient tolerated the procedure  well and was transferred to the recovery room in satisfactory condition.  All needle and sponge counts were correct.                                                 Judeth Cornfield. Scot Dock, M.D.   CSD/MEDQ  D:  09/08/2002  T:  09/09/2002  Job:  [id number]

## 2010-09-10 NOTE — Op Note (Signed)
Dale [name], Dale [name]                            ACCOUNT NO.:  [id number]   MEDICAL RECORD NO.:  [id number]                   PATIENT TYPE:  OIB   LOCATION:  [id number]                                 FACILITY:  [facility]   PHYSICIAN:  Newt Minion, M.D.                DATE OF BIRTH:  08/25/1965   DATE OF PROCEDURE:  06/20/2003  DATE OF DISCHARGE:                                 OPERATIVE REPORT   PREOPERATIVE DIAGNOSIS:  Osteomyelitis right calcaneus with chronic Wagner  grade 3 ulcer.   POSTOPERATIVE DIAGNOSIS:  Osteomyelitis right calcaneus with chronic Wagner  grade 3 ulcer.   PROCEDURE:  Partial calcaneal incision right calcaneus.   SURGEON:  Newt Minion, M.D.   ANESTHESIA:  LMA.   ESTIMATED BLOOD LOSS:  Minimal.   ANTIBIOTICS:  1 gram Kefzol.   TOURNIQUET TIME:  None.   SPECIMENS:  Cultures obtained x 2, bone sent to pathology.   DISPOSITION:  To the PACU in stable condition.   INDICATIONS FOR PROCEDURE:  The patient is a 45 year old gentleman with  chronic lateral ulcer over the calcaneus.  This is on a non-weight bearing  area of his heel.  The patient has failed conservative care with pressure  unloading, p.o. antibiotics, wound care and debridement without resolution  and presents at this time for partial calcaneal excision for chronic  osteomyelitis.  The risks and benefits were discussed including infection,  neurovascular injury, persistent pain, persistent ulceration, nonhealing of  the incision, need for amputation.  The patient states he understands and  wishes to proceed at this time.   DESCRIPTION OF PROCEDURE:  The patient was brought to OR room 4 and  underwent a general LMA anesthetic.  After an adequate level of anesthesia  was obtained, the patient's right lower extremity was prepped using DuraPrep  and draped in a sterile field.  An extensile incision was made with the  ulcer in the center of the excision.  The ulcer was ellipsed out in  continuity  without contamination of the ulcer within the surgical incision.  A partial calcaneal incision was performed, as well.  The wound was  irrigated with normal saline.  Hemostasis was obtained with electrocautery.  The incision was closed without tension of the skin with a simple suture of  2-0 nylon.  The  wound was covered with Adaptic orthopedic sponges, sterile Kerlix, and a  Coban dressing including a wrap for his venous stasis disease.  The patient  was extubated and taken to the PACU in stable condition.   PLAN:  23 hour observation, non-weight bearing on the right, follow up in  the office in one week.  Newt Minion, M.D.    MVD/MEDQ  D:  06/20/2003  T:  06/20/2003  Job:  [id number]

## 2010-09-10 NOTE — Discharge Summary (Signed)
[name], [name]                  ACCOUNT NO.:  [id number]   MEDICAL RECORD NO.:  [id number]          PATIENT TYPE:  INP   LOCATION:  5737                         FACILITY:  [facility]   PHYSICIAN:  Sammuel Hines. Marlou Starks, M.D.     DATE OF BIRTH:  December 18, 1965   DATE OF ADMISSION:  08/19/2004  DATE OF DISCHARGE:  08/20/2004                                 DISCHARGE SUMMARY   DISCHARGE DIAGNOSES:  1.  Acute appendicitis status post laparoscopic appendectomy on August 19, 2004, by Dr. Autumn Messing.  2.  Diabetes mellitus, insulin dependent, juvenile onset.  3.  Diabetic nephropathy status post left renal transplant.  4.  Hyperlipidemia on Statin therapy.  5.  Chronic renal insufficiency.   HOSPITAL COURSE:  [name] is a 45 year old male patient who had acute onset  of right lower quadrant pain around 5 a.m. on the day of admission.  This  was accompanied by diarrhea and nausea but no vomiting.  He also felt as  though he had a fever.  There were no symptoms prior to 5 a.m.  His past  medical history was significant for a left renal transplant approximately 10  years ago at [facility] secondary to a diabetic nephropathy.   Upon presentation to the emergency room, the patient was afebrile and there  was no leukocytosis on CBC.  A CT of the abdomen and pelvis with virtual  contrast confirmed acute appendicitis and the patient was taken emergently  to the operating room where he underwent a laparoscopic appendectomy under  the care of Dr. Autumn Messing.  He tolerated the procedure well and was taken to  the recovery room in stable condition. He remained in the hospital overnight  and the following day, was eating well, was ambulating without difficulty  and was ready for discharge to home.   During the patient's hospital stay, we did consult the renal team to follow  this complicated patient secondary to his nephropathy, renal transplant as  well as renal insufficiency.   The patient will be  discharged to home on Darvocet-N 100 one to two tablets  q.6h. as needed for pain, #30, no refills.  He is to continue his  medications as prior to admission which include Neoral 125 mg twice a day,  Prednisone 5 mg a day, Amiloride 5 mg a day, Actos 30 mg a day, Lasix 80 mg  twice a day, allopurinol 100 mg twice a day, Lipitor 20 mg a day, Avapro 150  mg a day, colchicine 0.6 mg daily and as needed, Lantus 44 units each  morning and NovoLog sliding scale as prior to admission.   The patient is instructed not to drive for two days.  No lifting over 10  pounds for two weeks.  He may shower and bathe and may walk up steps.  He is  to clean over his incision sites with soap and water, no scrubbing.  He has  a follow-up appointment to see Dr. Marlou Starks on Sep 08, 2004, at 9 a.m.  Otherwise he is to  call us with any questions or concerns at [phone].      LB/MEDQ  D:  08/20/2004  T:  08/20/2004  Job:  [id number]   cc:   Windy Kalata, M.D.  [place]  [place] [address]  Fax: [phone]

## 2010-09-10 NOTE — Op Note (Signed)
[facility]  Patient:    Dale [name], Dale [name]                         MRN: [id number] Proc. Date: 02/08/00 Adm. Date:  [id number] Disc. Date: [id number] Attending:  Charlette Caffey CC:         Orlando Penner. London Pepper, M.D.   Operative Report  PREOPERATIVE DIAGNOSES: 1. Chronic and recurrent vitreous hemorrhage right eye. 2. Proliferative diabetic retinopathy right eye.  POSTOPERATIVE DIAGNOSES: 1. Chronic and recurrent vitreous hemorrhage right eye. 2. Proliferative diabetic retinopathy right eye.  OPERATION:  Pars plana vitrectomy using vitreous infusion suction cutter, endolaser panretinal photocoagulation.  SURGEON:  Garey Ham, M.D.  ASSISTANT:  Nurse.  ANESTHESIA:  General.  Ophthalmoscopy as previously described.  DESCRIPTION OF PROCEDURE:  After the patient was prepped and draped and lid speculum was inserted in the right eye. The eye was turned downward and a superior rectus traction suture placed. A peritomy was performed adjacent to the limbus from the 9 to 3:00 position. The corneoscleral junction was cleaned and three sclerotomy sites prepared at the 8, 10 and 2:00 position 3.5 mm from the limbus using the MVR blade and 20 gauge needle. The 4 mm vitreous infusion terminal was secured in place at the 8:30 position with a 5.0 green Mersilene suture. The fiberoptic light pipe was inserted at the 2:00 position and the hand piece vitreous infusion cutter at the 10:00 position. Slow vitreous infusion suction cutting were begun from an anterior to posterior direction toward the retina. Dense vitreous hemorrhage was present, which gradually cleared revealing the underlying proliferative diabetic retinopathy. Two areas of proliferation were noted in the superior temporal and superior nasal area, with stalks extending out into the vitreous cavity. These were trimmed back to the retinal surface. There was some surface retinal bleeding.  Using the [facility] brush tip aspirator the surface blood was removed. The endolaser photocoagulator was then assembled and [id number] applications applied in a panretinal scatter technique outside the major vascular arcades. The applications were satisfactory with good retinal burns achieved. There continued to be slight diffuse vitreous bleeding from an unknown source. Indirect ophthalmoscopy was performed using scleral depression, and no peripheral retinal tears or detachment areas seen. The vitreous was cleared again with the vitreous infusion suction cutter, and it was elected to close. The sclerotomy sites were closed with 7-0 Vicryl interrupted sutures, and the conjunctiva closed with a running 7-0 Vicryl suture. Depo Garamycin and Celestone were injected in the sub-Tenons space inferiorly. Maxitrol and Atropine ointment were instilled in the conjunctival cul de sac. A light patch and protector shield were applied to the operated eye.  Duration of procedure and anesthesia administration one to one half hours. The patient tolerated the procedure well in general and left the operating room for the recovery room in good condition. DD:  02/08/00 TD:  02/08/00 Job: [id number]

## 2010-09-10 NOTE — Op Note (Signed)
[name], Dale [name]                            ACCOUNT NO.:  [id number]   MEDICAL RECORD NO.:  [id number]                   PATIENT TYPE:  INP   LOCATION:  3316                                 FACILITY:  [facility]   PHYSICIAN:  Judeth Cornfield. Scot Dock, M.D.        DATE OF BIRTH:  07-May-1965   DATE OF PROCEDURE:  09/27/2002  DATE OF DISCHARGE:                                 OPERATIVE REPORT   PREOPERATIVE DIAGNOSIS:  Cellulitis of the right thigh, with possible fluid  collection in the right groin and tunnel track from an old arteriovenous  graft.   POSTOPERATIVE DIAGNOSIS:  Cellulitis of the right thigh, with possible fluid  collection in the right groin and tunnel track from an old arteriovenous  graft.   PROCEDURE:  Incision and drainage of right groin wound and old arteriovenous  graft tunnel.   SURGEON:  Judeth Cornfield. Scot Dock, M.D.   ASSISTANT:  Sueanne Margarita, P.A.   ANESTHESIA:  General.   INDICATIONS FOR PROCEDURE:  This is a 45 year old gentleman who  approximately two to three weeks ago had the removal of an infected AV graft  in the right thigh.  He had cellulitis of the distal graft at that time, and  had been explored with purulent material around the graft, although that  culture never grew anything.  The entire graft was removed, although the  graft was well-incorporated in the groin.  The graft was removed entirely  from the artery and vein with a vein patch angioplasty of the brachial  artery being performed.  The patient had been readmitted with a fever and  had significant cellulitis of the right thigh and also the right leg.  He  underwent and MRI which suggested some fluid in the right groin, and an  ultrasound also suggested fluid in the tract.  For this reason, it was felt  that exploration was indicated.   DESCRIPTION OF PROCEDURE:  The patient was taken to the operating room and  received a general anesthetic.  The right groin was prepped and draped  in  the usual sterile fashion.  The previous incision in the right groin was  opened and the dissection carried down in the subcutaneous tissue.  There  was some lymphatic fluid present, but no gross purulence.  A culture was  sent.  I tried to dissect in multiple areas to be sure we were not leaving  any undrained fluid; however, there were multiple lymph nodes and  significant scar tissue in the groin, making this difficult.  I did open the  distal track and then bluntly opened the old track open, and made a counter  incision on the medial and lateral aspect of the old tunnel, to provide a  thorough drainage.  Next, hemostasis was obtained in the wounds.  The wounds  were irrigated with antibiotic solution.  I closed the deep layer in the  groin with running #3-0  Vicryl, allowing to pack deep with a 4 x 4 from the  superior aspect.  The inferior aspect of the incision was closed with  interrupted #3-0 nylon.  The track was then packed with 4 x 4s soaked in  antibiotic solution.  Dry 4 x 4s were  placed over the top of this, and then an ABD pad was placed over this.  The patient tolerated the procedure well and was transferred to the recovery  room in satisfactory condition.  All needle and sponge counts were correct.                                                Judeth Cornfield. Scot Dock, M.D.    CSD/MEDQ  D:  09/26/2002  T:  09/27/2002  Job:  [id number]

## 2010-09-10 NOTE — Discharge Summary (Signed)
NAMEJAIKOB, ISHIMOTO                            ACCOUNT NO.:  [id number]   MEDICAL RECORD NO.:  [id number]                   PATIENT TYPE:  INP   LOCATION:  5527                                 FACILITY:  [facility]   PHYSICIAN:  Judeth Cornfield. Scot Dock, M.D.        DATE OF BIRTH:  1966-01-29   DATE OF ADMISSION:  09/24/2002  DATE OF DISCHARGE:  10/04/2002                                 DISCHARGE SUMMARY   ADMISSION DIAGNOSES:  1. Sepsis.  2. Cellulitis, right thigh, site of arteriovenous graft, removed Sep 08, 2002.  3. End-stage renal disease with a functioning cadaveric kidney transplant.  4. Adult onset diabetes mellitus type 2, insulin-dependent with poor     control.  5. Hypertension.  6. Hyperlipidemia.  7. 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  8. Obesity.  9. History of gout.   DISCHARGE DIAGNOSES:  1. Sepsis.  2. Cellulitis, right thigh, site of arteriovenous graft, removed Sep 08, 2002.  3. End-stage renal disease with a functioning cadaveric kidney transplant.  4. Adult onset diabetes mellitus type 2, insulin-dependent with poor     control.  5. Hypertension.  6. Hyperlipidemia.  7. 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  8. Obesity.  9. History of gout.   PROCEDURE:  Incision and drainage of the right groin wound, old  arteriovenous graft tunnel site, September 27, 2002.   BRIEF HISTORY:  The patient is a 45 year old white male, status post  cadaveric renal transplant in 1995, with a history of hypertension,  dyslipidemia, and gout.  He was recently discharged from [facility] on  Sep 13, 2002, because of a right heel/foot ulcer, right shin cellulitis,  gouty flare up, and right AV graft infection, status post removal by Dr.  Scot Dock on Sep 08, 2002.  Also, during the recent hospitalization, the  patient had a positive urine culture, which was finalized after discharge,  and grew out Staphylococcus sensitive to vancomycin.   During his  hospitalization, he received Zosyn and Augmentin b.i.d.  He had  negative blood cultures x2.  Orthopedics was consulted because of his right  knee effusion, and joint aspiration showed no blood.  He had presented to  the renal service today with 24 hours of fever, unknown temperature, with  chills, nausea and vomiting x12 hours, and new onset of low back pain.  He  denied changes in his bowel habits, black tarry stools, shortness of breath,  or pain with the kidney transplant.  He said that he finished the Augmentin  500 mg p.o. b.i.d. that was given to him upon his discharge.  He said that  he followed up with [facility] five days ago and received a good report.  He also  noted he was recently told by Dr. Mercy Moore to begin taking a potassium  supplement.  He was to follow up with the renal service on September 27, 2002.   ALLERGIES:  1. DILAUDID.  2. VASOTEC.   REVIEW OF SYSTEMS:  In HPI; for further history and physical, please see the  dictated note.   ADMISSION MEDICATIONS:  1. Potassium, dose unknown.  2. Cardizem 5 mg daily.  3. Lantus __________ units subcu q.a.m.  4. Neoral 125 mg p.o. b.i.d.  5. Novolog 6 units subcu a.c. t.i.d. and 3 units subcu at bedtime.  6. Multivitamin one daily.  7. Calcium and vitamin E 600 mg one tablet p.o. t.i.d. a.c.  8. Prilosec 20 mg p.o. p.r.n. q.a.m.  9. Lasix 80 mg two tablets p.o. daily.  10.      Allopurinol 100 mg b.i.d.  11.      Diovan 160 mg p.o. daily.  12.      Augmentin 500 mg p.o. b.i.d.   For further history and physical, please see the dictated note.   HOSPITAL COURSE:  The patient was admitted, started on IV antibiotics and  local wound care, seen by Dr. Scot Dock, and subsequently taken to the  operating room where he underwent incision and drainage of the AV Gore-Tex  tunnel site.  MRI showed a fluid collection and partial hematoma in the  right groin and cellulitis of the right thigh and leg.  The patient  tolerated the incision  and drainage well.  He was maintained on IV  antibiotics.  His cultures grew out no new organisms.  He was eventually  switched from Zosyn to Cipro.  He underwent daily local packing.  He had  some drainage which has improved, and it was Dr. Nicole Cella opinion on October 03, 2002, the site has shown good improvement, he was afebrile and doing  quite well, and he was quite bored and anxious to go home.  Arrangements  have been made for home health to do b.i.d. dressing changes to his open  site.  The plan is to send him home on Cipro 500 mg p.o. daily.   DISCHARGE MEDICATIONS:  1. Additional changes in his medicines include an increase in his Lantus     insulin to 52 units subcu daily.  2. Reglan 10 mg one a.c. and at bedtime.  3. Ferrous sulfate 300 mg one b.i.d. with lunch and supper.  4. Lasix, a new dose, decreased to 40 mg b.i.d.  5. Cipro 500 mg daily.  6. He was also instructed to take Tylenol 500 mg two p.o. q.6 h. p.r.n. or     Tylox 1-2 p.o. q.4-6 h. p.r.n.   DISCHARGE ACTIVITY:  Light.   DIET:  Renal/diabetic diet.   WOUND CARE:  Home health nurse will pack the wound b.i.d.    FOLLOWUP:  1. The patient was instructed to contact Dr. Lorrene Reid for followup.  2. Dr. Nicole Cella office will call for followup in approximately two weeks.   CONDITION ON DISCHARGE:  Improving.     Lydia Guiles, P.A.-C               Judeth Cornfield. Scot Dock, M.D.    WDJ/MEDQ  D:  10/03/2002  T:  10/04/2002  Job:  [id number]   cc:   Elzie Rings. Lorrene Reid, M.D.  [address]  [place]  [place] [address]  Fax: [facility]

## 2010-09-10 NOTE — Consult Note (Signed)
Dale [name], Dale [name]                  ACCOUNT NO.:  [id number]   MEDICAL RECORD NO.:  [id number]          PATIENT TYPE:  INP   LOCATION:  5737                         FACILITY:  [facility]   PHYSICIAN:  Maudie Flakes. Hassell Done, M.D.   DATE OF BIRTH:  10-24-1965   DATE OF CONSULTATION:  DATE OF DISCHARGE:                                   CONSULTATION   ADDENDUM:   LABORATORY DATA:  Urinalysis was negative.  Urine microscopy was not  performed due to negative urinalysis.      RFF/MEDQ  D:  08/19/2004  T:  08/20/2004  Job:  [id number]

## 2010-09-10 NOTE — Discharge Summary (Signed)
[facility]  Patient:    Dale [name], Dale [name]                         MRN: [id number] Adm. Date:  [id number] Disc. Date: [id number] Attending:  Lucianne Muss Dictator:   Maia Plan, P.A.                           Discharge Summary  ADMITTING DIAGNOSES: 1. Chest pain, new-onset with multiple risk factors. 2. Status post renal transplant with stable creatinine on cyclosporine. 3. Hypertension. 4. Insulin-dependent diabetes mellitus.  DISCHARGE DIAGNOSES: 1. Chest pain resolved with cardiac enzymes and electrocardiogram negative for    outpatient work-up. 2. Status post renal transplant with stable creatinine on cyclosporine. 3. Hypertension. 4. Insulin-dependent diabetes mellitus.  HISTORY OF PRESENT ILLNESS:  This is a 45 year old, white male with diabetes, insulin-dependent type 1 with end-stage renal disease, status post renal transplant in 1995, and a stable creatinine around 2.0.  He works as a Education administrator and today developed unexpected symptoms of generalized weakness with some tingling in the left hand lasting several minutes.  He then became a little bit lightheaded and nausea.  He sat down and developed substernal chest tightness.  EMS was called and he came to [facility] ER.  He was admitted on oxygen and given aspirin.  He refused nitroglycerin because they told him that it might cause a headache.  The pain resolved nearly completely on arrival to the ER.  Total duration of episode was 30-45 minutes. He denies any prior history of angina, chest pain, heart murmur, heart problems or coronary artery disease.  He did have a stress test in 1995, by [facility] as part of a pretransplant evaluation.  LABORATORY DATA AND X-RAY FINDINGS:  Sodium 141, potassium 5.6, BUN 51, creatinine 2.0.  White count 9000, hematocrit 35%.  CPK 74, troponin 0.01. EKG with normal sinus rhythm with no acute changes at a heart rate of 66  beats per minute.  HOSPITAL COURSE:  The patient was admitted on oxygen, monitored on telemetry and placed on his usual medications.  Serial enzymes were obtained and these were negative.  Serial EKGs were obtained and these were also negative. [facility] was consulted and recommended a Cardiolite, but the patient refused to remain hospitalized for this test and insisted on being discharged. Since his pain resolved and his enzymes and EKG were negative for acute MI, he was discharged on the usual medications and given sublingual nitroglycerin to be taken as needed for chest pain.  He was advised not to do any heavy exertion and outpatient arrangements were made for a cardiolite to be done and followed up with Dr. Lyndel Safe.  DISCHARGE MEDICATIONS: 1. Avapro 150 mg daily. 2. Lasix 160 mg t.i.d. 3. Neoral 100 mg b.i.d. 4. Prednisone 5 mg daily. 5. Lipitor 20 mg daily. 6. Cardizem CD 180 mg daily. 7. Clonidine 0.2 mg b.i.d. 8. Allopurinol 100 mg daily. 9. Sublingual nitroglycerin 1/150 p.r.n. chest pain. DD:  11/18/00 TD:  11/20/00 Job: [id number]

## 2010-09-10 NOTE — H&P (Signed)
NAMEJANCE, Dale [name]                  ACCOUNT NO.:  [id number]   MEDICAL RECORD NO.:  [id number]          PATIENT TYPE:  INP   LOCATION:  5702                         FACILITY:  [facility]   PHYSICIAN:  Caren Griffins B. Lorrene Reid, M.D.DATE OF BIRTH:  10-06-1965   DATE OF ADMISSION:  11/16/2004  DATE OF DISCHARGE:                                HISTORY & PHYSICAL   Patient is a 45 year old white male with insulin-requiring diabetes, a  history of end-stage renal disease status post cadaveric renal transplant in  1995 and other diabetic complications.  He is status post amputation of his  right great toe and left fifth ray on October 21, 2004, secondary to  osteomyelitis and foot ulcers.  He was in the short stay unit today  receiving a scheduled dose of intravenous iron when he was noted to be  febrile to 101 degrees.  He additionally, at that time, gave a history of  fevers at home, chills, dry heaves, and generally feeling poorly.  He had  been on antibiotics per Dr. Sharol Given (clindamycin 300 mg t.i.d.) since November 12, 2004, secondary to some angry cellulitic appearance to his left foot around  the amputation site.  He had been noting some drainage from the foot.  We  removed the dressing in short stay and there was a large amount of brownish  purulent material that exuded from the foot wound which Dreu says was a  definite change over the past 24 hours.  He is therefore admitted for IV  antibiotics and surgical consultation with Dr. Sharol Given.   ALLERGIES:  1.  AUGMENTIN and AMOXICILLIN which cause a rash.  2.  VASOTEC.   CURRENT MEDICATIONS:  1.  Clindamycin 300 mg t.i.d.  2.  Prednisone 5 mg a day.  3.  Neoral 125 mg b.i.d.  4.  Lipitor 20 mg q.p.m.  5.  Lantus insulin 44 units q.a.m.  6.  Amiloride 5 mg daily.  7.  Lasix 80 mg b.i.d.  8.  Omnicor 1 g two b.i.d.  9.  Avapro 150 mg q.p.m.  10. Aranesp 100 mcg once a month.   PAST MEDICAL HISTORY:  1.  Insulin-dependent diabetes mellitus with  triopathy.  2.  History of end-stage renal disease.  3.  Status post cadaveric renal transplant in 1995 on triple drug therapy.  4.  Prior history of MRSA/osteomyelitis of the calcaneus/osteomyelitis of      the right great toe/osteomyelitis of the left fifth ray, status post      right great toe and left fifth ray amputations on October 21, 2004.  5.  Anemia on Aranesp.  6.  Hyperlipidemia.  7.  Gout.  8.  History of left cataract.  9.  History of laparoscopic appendectomy April 2006.   FAMILY HISTORY:  Noncontributory.   SOCIAL HISTORY:  Patient does not smoke or drink.  He lives here in  [place], [place], who is currently accompanying him.   REVIEW OF SYSTEMS:  Positive nausea, vomiting, dry heaves, fevers and chills  for one week.  He has noted a decrease in the  amount of redness in the left  foot but now with increased drainage over the past 24 hours.  He denies  chest pain, shortness of breath, abdominal pain, allograft tenderness, rash,  melena, hematochezia.  He has not had any problems with his right toe  amputation site.   PHYSICAL EXAMINATION:  GENERAL APPEARANCE:  He is a very nice young man who  appears to be feeling badly but in no acute distress.  VITAL SIGNS:  Temperature 101 degrees, blood pressure 104/54.  HEENT:  Normocephalic, no evidence for trauma, no JVD, no carotid bruits.  LUNGS:  Clear to auscultation.  CARDIOVASCULAR:  S1 and S2, no S3 or S4.  ABDOMEN:  Obese.  He had an allograft in the left lower quadrant which was  nontender.  EXTREMITIES:  He had 1 to 2+ edema of the lower extremities, right great toe  amputation site was okay.  Left foot revealed an open wound along the entire  lateral aspect of the foot with sutures in place and from the mid and lower  aspects of the wound there was a copious amount of brown yellow drainage.   LABORATORY STUDIES:  Including CBC, CMET, coags, blood cultures and Gram  stain and culture of the left foot  drainage are all pending.   IMPRESSION:  A 45 year old white male with insulin requiring diabetes with  triopathy, status post recent amputations with:  1.  Left foot infection at the amputation site, suspect deep infection.  He      has a prior history of methicillin resistant Staphylococcus aureus.      Will obtain blood cultures x3, culture his foot.  Start him on empiric      vancomycin with his history of methicillin resistant Staphylococcus      aureus, add Cipro empirically for gram negative coverage, obtain an MRI      of the foot and ask Dr. Sharol Given to see.  2.  Renal transplant.  Continue usual medications.  3.  Insulin-dependent diabetes mellitus - usual Lantus insulin with Accu-      Cheks and sliding scale insulin.  4.  Hyperlipidemia.  Continue medications.  5.  Hypertension.  Continue medications.  6.  Anemia.  Continue Aranesp.       CBD/MEDQ  D:  11/16/2004  T:  11/16/2004  Job:  [id number]   cc:   Newt Minion, MD  [place]  [place] [address]  Fax: [phone]

## 2010-09-10 NOTE — H&P (Signed)
NAMEHOLLICE, [name] NO.:  [id number]   MEDICAL RECORD NO.:  [id number]          PATIENT TYPE:  EMS   LOCATION:  MAJO                         FACILITY:  [facility]   PHYSICIAN:  Joesphine Bare, P.A.       DATE OF BIRTH:  [birth year]   DATE OF ADMISSION:  08/19/2004  DATE OF DISCHARGE:                                HISTORY & PHYSICAL   CHIEF COMPLAINT:  Right lower quadrant pain.   HISTORY OF PRESENT ILLNESS:  [name] is a 45 year old male patient with an  acute onset of right lower quadrant pain at 5 a.m. today.  This was  associated with nausea, as well as diarrhea.  No vomiting.  He also felt as  if he had had a fever.  No other symptoms prior to this episode.   PAST MEDICAL HISTORY:  Significant for a left renal transplant approximately  10 years ago secondary to diabetic nephropathy.  He presented to the  emergency room afebrile with no leukocytosis.  A CT of the abdomen and  pelvis utilizing rectal contrast revealed acute appendicitis.  Of note, the  patient does have chronic renal insufficiency with a baseline creatinine of  2.5.   ALLERGIES:  1.  AMOXICILLIN.  2.  VASOTEC (cough).   PAST MEDICAL HISTORY:  1.  Diabetes, juvenile onset.  2.  Hyperlipidemia.  3.  Chronic renal insufficiency with a baseline creatinine of 2.5.  Status      post left renal transplant.  4.  History of umbilical hernia repair by Dr. Rise Patience.  5.  History of multiple peritoneal dialysis catheters and access ports.   MEDICATIONS:  1.  Neoral 125 mg b.i.d.  2.  Prednisone 5 mg daily.  3.  Amiloride 5 mg daily.  4.  Actos 30 mg daily.  5.  Lasix 80 mg b.i.d.  6.  Allopurinol 100 mg b.i.d.  7.  Lipitor 20 mg daily.  8.  Avapro 150 mg daily.  9.  Colchicine 0.6 mg q.p.m.  10. Lantus 44 units q.a.m.  11. Sliding scale insulin NovoLog.   SOCIAL HISTORY:  Lives in [place].  No tobacco, alcohol, or illicit drug  use.  He follows a renal diet.   FAMILY HISTORY:  Both  parents alive and well.  Mother has hyperlipidemia.  Father has diabetes, as well as hypertension.   REVIEW OF SYSTEMS:  Fever, sweats, chronic steroid use, nausea, diarrhea,  right lower quadrant abdominal pain, diabetes, as well as  hypercholesterolemia.  All other systems were negative.   PHYSICAL EXAMINATION:  VITAL SIGNS:  Temperature 97.5, pulse 96,  respirations 24, blood pressure 138/78, O2 saturations 100% on room air.  GENERAL:  He appears acutely ill, complaining of right lower quadrant  abdominal pain.  HEENT:  Grossly normal.  No carotid or supraclavicular bruits.  No JVD or  thyromegaly.  Nares without discharge.  Sclerae clear.  Conjunctivae normal.  No lymphadenopathy.  HEART:  Regular rate and rhythm.  No gross murmur, rub, or ectopy.  LUNGS:  Clear to auscultation bilaterally.  No wheezes, rales, or  rhonchi.  SKIN:  No rash.  ABDOMEN:  Soft.  He does have rebound tenderness.  He does have right lower  quadrant abdominal pain.  He has 2 areas to the right of his umbilicus that  he states it where peritoneal dialysis catheters have been placed in the  past.  EXTREMITIES:  No clubbing, cyanosis, or edema.  He does have Charcot feet  with bilateral ulcerations, and is seen at the foot clinic.  At his right  lower extremity, upper thigh, he does have a scar from a previous IV access.  NEUROLOGIC:  Cranial nerves II-XII grossly intact.  Alert and oriented x3.   CT scan shows acute appendicitis.   LABORATORY DATA:  Creatinine of 2.5, BUN 77, sodium 139, potassium 4.2.  Hemoglobin 11.9, hematocrit 35.4, white count 8.3.  Urinalysis negative.   ASSESSMENT AND PLAN:  1.  Right lower quadrant pain.  CT consistent with acute appendicitis.  2.  Diabetes mellitus, insulin dependent, juvenile onset.  3.  Diabetic nephropathy, status post left renal transplant.  4.  Hyperlipidemia, on statin therapy.  5.  Chronic renal insufficiency with a baseline creatinine of 2.5.   The  patient was seen and examined by Dr. Autumn Messing.  The patient will be  taken emergently to the operating room for an appendectomy.      LB/MEDQ  D:  08/19/2004  T:  08/19/2004  Job:  [id number]   cc:   Windy Kalata, M.D.  [address]  [place]  [place] [address]  Fax: [phone]   Sammuel [name]. Daiva Nakayama, M.D.  [id number]  [place]  [place] [address]

## 2010-09-10 NOTE — Assessment & Plan Note (Signed)
[facility]                         GASTROENTEROLOGY OFFICE NOTE   DYSHAWN, LELLI                         MRN:          [id number]  DATE:08/11/2006                            DOB:          [birth year]    [name] is a 45 year old white male, status post renal transplantation  on January 1 of 1995.  He is referred today for evaluation of chronic  anemia with guaiac-positive stools recently on home Hemoccult testing on  August 04, 2006.  His hemoglobin is running approximately 9-10 and he has  normochromic normocytic indices with an iron saturation of 23% on recent  lab data.   [name] denies any GI complaints, whatsoever, except for some chronic  acid reflux, for which he takes regular omeprazole.  If he did not take  omeprazole, he said he would have regurgitation, burning, substernal  chest pain.  He denies associated dysphagia.  He does take Advil several  times a week, but denies abuse of alcohol, cigarettes or NSAIDs.  He  apparently had an endoscopy and colonoscopy some ten years ago.  It is  unclear where this was done, or who was the physician, and what the  findings were.  He is having regular bowel movements, denies bowel  irregularity, melena or hematochezia.  He has never had problems with  hemorrhoids or abdominal pain.  His appetite is good and his weight is  stable and he denies any specific food intolerances.   PAST MEDICAL HISTORY:  His past medical history is very complex and  revolves around adult-onset diabetes, which he has had for 34 years, and  he is insulin-dependent.  He has had associated peripheral neuropathy,  peripheral vascular disease, and has had several toes amputated.  He has  had chronic renal failure, severe ophthalmologic disease, but no  coronary artery disease.  He has had previous surgical repair for an  umbilical hernia and has had an appendectomy.  As mentioned above, his  transplant was some 13 years ago  and his creatinine is running  approximately 2.5 mg%.   MEDICATIONS:  1. Patient is currently on omeprazole 20 mg a day.  2. Prednisone 5 mg a day.  3. Actos 45 mg each morning.  4. Allopurinol 300 mg a day for gout.  5. Lasix 80 mg a day.  6. Cyclosporine 100 mg twice a day, cyclosporine 25 mg twice a day.  7. Lipitor 20 mg at bedtime.  8. Avapro 150 mg at bedtime.  9. Colchicine 0.6 mg at bedtime and p.r.n.  10.Lantus 44 units q.a.m.  11.NovoLog sliding scale before meals and p.r.n.  12.He also uses p.r.n. Tums.   He has had previous reactions to AMOXICILLIN, CIPRO and TETRACYCLINE.   FAMILY HISTORY:  Remarkable for diabetes.  His father apparently had  severe peptic ulcer disease and had a partial gastrectomy.   SOCIAL HISTORY:  He is divorced and lives currently with his fiancee.  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  He does not smoke or use  ethanol.   REVIEW OF SYSTEMS:  Otherwise noncontributory.  He has had some low  blood pressure recently and his Lasix is being adjusted.  He denies any  problems with diabetic gut enteropathy.   EXAMINATION:  He is a chronically ill-appearing white male, in no acute  distress, appearing older than his stated age.  He is 5 feet 8 inches  tall and weighs 243 pounds.  Blood pressure 130/80 and pulse was 100 and  regular.  I could not appreciate stigmata of chronic liver disease or  thyromegaly.  Chest was clear and he was in regular rhythm without  significant murmurs, gallops or rubs.  His abdomen showed some exogenous  obesity, but I could not appreciate any definite organomegaly or  tenderness.  He did have a palpable mass in the left mid-abdominal area,  consistent with renal transplantation.  He did have multiple well-healed  surgical scars on his abdomen.  There was bilateral peripheral edema.  There was +1 pitting, right leg greater than left.  He also had evidence   of atrophic skin, consistent with chronic dermatitis of his lower  extremities.  Mental status was clear.  Rectal exam was deferred at this  time.   ASSESSMENT:  1. Guaiac-positive stools of unexplained etiology - rule out colonic      polyposis, versus upper GI source from chronic acid reflux disease      and possible Barrett's mucosa.  2. Chronic acid reflux - rule out Barrett's.  3. Severe diabetes with associated peripheral neuropathy, peripheral      vascular disease, amputations, renal insufficiency with      transplantation, and ophthalmologic disease.  4. Status post repair of umbilical hernia.  5. Status post appendectomy.  6. History of gouty arthritis.  7. Anemia of chronic disease.  Patient is on Aracept  injections      monthly.   RECOMMENDATIONS:  1. Continue reflux regimen with omeprazole therapy.  2. Outpatient endoscopy and colonoscopy with adjustment in his      diabetic medications.  3. Continue other multiple medications per Dr. Mercy Moore.     Loralee Pacas. Sharlett Iles, MD, [facility], [place]  Electronically Signed    DRP/MedQ  DD: 08/11/2006  DT: 08/11/2006  Job #: [id number]   cc:   Windy Kalata, M.D.

## 2010-09-10 NOTE — Discharge Summary (Signed)
NAMECORTEZ, COAST                  ACCOUNT NO.:  [id number]   MEDICAL RECORD NO.:  [id number]          PATIENT TYPE:  OIB   LOCATION:  [id number]                         FACILITY:  [facility]   PHYSICIAN:  Newt Minion, MD     DATE OF BIRTH:  [birth year]   DATE OF ADMISSION:  10/21/2004  DATE OF DISCHARGE:  10/23/2004                                 DISCHARGE SUMMARY   DIAGNOSIS:  Osteomyelitis, right great toe, and osteomyelitis of the base of  the left fifth metatarsal.   PROCEDURES:  1.  Left fifth ray amputation.  2.  Right great toe amputation.   The patient received Kefzol for infection prophylaxis.  Postoperatively, the  patient progressed well.  He was started on a morphine PCA, and this was  discontinued on postoperative day #1.  The patient was on IV vancomycin.  The patient does have a wheelchair at home and was discharged once he was  safe with transfer training and able to ambulate with his wheelchair.  The  patient was discharged to home in stable condition October 23, 2004, with follow-  up in the office in one week.  He was given instructions for minimized  weightbearing of both lower extremities, wheelchair for ambulation.      Newt Minion, MD  Electronically Signed     MVD/MEDQ  D:  12/23/2004  T:  12/23/2004  Job:  [id number]

## 2010-09-24 ENCOUNTER — Encounter (HOSPITAL_COMMUNITY): Payer: Medicare Other | Attending: Nephrology

## 2010-09-24 ENCOUNTER — Other Ambulatory Visit: Payer: Self-pay | Admitting: Nephrology

## 2010-09-24 DIAGNOSIS — N184 Chronic kidney disease, stage 4 (severe): Secondary | ICD-10-CM | POA: Insufficient documentation

## 2010-09-24 DIAGNOSIS — D638 Anemia in other chronic diseases classified elsewhere: Secondary | ICD-10-CM | POA: Insufficient documentation

## 2010-09-27 LAB — POCT HEMOGLOBIN-HEMACUE: Hemoglobin: 11.3 g/dL — ABNORMAL LOW (ref 13.0–17.0)

## 2010-10-15 ENCOUNTER — Encounter (HOSPITAL_COMMUNITY): Payer: Medicare Other

## 2010-11-05 ENCOUNTER — Encounter (HOSPITAL_COMMUNITY): Payer: Medicare Other | Attending: Nephrology

## 2010-11-05 ENCOUNTER — Other Ambulatory Visit: Payer: Self-pay | Admitting: Nephrology

## 2010-11-05 DIAGNOSIS — D638 Anemia in other chronic diseases classified elsewhere: Secondary | ICD-10-CM | POA: Insufficient documentation

## 2010-11-05 DIAGNOSIS — N184 Chronic kidney disease, stage 4 (severe): Secondary | ICD-10-CM | POA: Insufficient documentation

## 2010-11-05 LAB — POCT HEMOGLOBIN-HEMACUE: Hemoglobin: 10.8 g/dL — ABNORMAL LOW (ref 13.0–17.0)

## 2010-11-26 ENCOUNTER — Other Ambulatory Visit: Payer: Self-pay | Admitting: Nephrology

## 2010-11-26 ENCOUNTER — Encounter (HOSPITAL_COMMUNITY): Payer: Medicare Other | Attending: Nephrology

## 2010-11-26 DIAGNOSIS — D638 Anemia in other chronic diseases classified elsewhere: Secondary | ICD-10-CM | POA: Insufficient documentation

## 2010-11-26 DIAGNOSIS — N184 Chronic kidney disease, stage 4 (severe): Secondary | ICD-10-CM | POA: Insufficient documentation

## 2010-11-26 LAB — POCT HEMOGLOBIN-HEMACUE: Hemoglobin: 10.6 g/dL — ABNORMAL LOW (ref 13.0–17.0)

## 2010-12-17 ENCOUNTER — Encounter (HOSPITAL_COMMUNITY): Payer: Medicare Other

## 2010-12-17 ENCOUNTER — Other Ambulatory Visit: Payer: Self-pay | Admitting: Nephrology

## 2010-12-17 LAB — POCT HEMOGLOBIN-HEMACUE: Hemoglobin: 8.8 g/dL — ABNORMAL LOW (ref 13.0–17.0)

## 2011-01-03 ENCOUNTER — Other Ambulatory Visit: Payer: Self-pay | Admitting: Nephrology

## 2011-01-03 ENCOUNTER — Encounter (HOSPITAL_COMMUNITY): Payer: Medicare Other | Attending: Nephrology

## 2011-01-03 DIAGNOSIS — N184 Chronic kidney disease, stage 4 (severe): Secondary | ICD-10-CM | POA: Insufficient documentation

## 2011-01-03 DIAGNOSIS — D638 Anemia in other chronic diseases classified elsewhere: Secondary | ICD-10-CM | POA: Insufficient documentation

## 2011-01-03 LAB — CBC
HCT: 27.9 % — ABNORMAL LOW (ref 39.0–52.0)
Hemoglobin: 9.2 g/dL — ABNORMAL LOW (ref 13.0–17.0)
MCH: 30 pg (ref 26.0–34.0)
MCHC: 33 g/dL (ref 30.0–36.0)
MCV: 90.9 fL (ref 78.0–100.0)
Platelets: 179 10*3/uL (ref 150–400)
RBC: 3.07 MIL/uL — ABNORMAL LOW (ref 4.22–5.81)
RDW: 17.3 % — ABNORMAL HIGH (ref 11.5–15.5)
WBC: 8.3 10*3/uL (ref 4.0–10.5)

## 2011-01-03 LAB — POCT HEMOGLOBIN-HEMACUE: Hemoglobin: 9.5 g/dL — ABNORMAL LOW (ref 13.0–17.0)

## 2011-01-07 ENCOUNTER — Encounter (HOSPITAL_COMMUNITY): Payer: Medicare Other

## 2011-01-13 LAB — HEMOGLOBIN AND HEMATOCRIT, BLOOD
HCT: 33 — ABNORMAL LOW
Hemoglobin: 11.5 — ABNORMAL LOW

## 2011-01-17 LAB — CBC
HCT: 33.3 — ABNORMAL LOW
Hemoglobin: 11.3 — ABNORMAL LOW
MCHC: 34
MCV: 89.7
Platelets: 231
RBC: 3.71 — ABNORMAL LOW
RDW: 19.4 — ABNORMAL HIGH
WBC: 5.7

## 2011-01-17 LAB — FERRITIN: Ferritin: 103 (ref 22–322)

## 2011-01-17 LAB — IRON AND TIBC
Iron: 40 — ABNORMAL LOW
Saturation Ratios: 19 — ABNORMAL LOW
TIBC: 209 — ABNORMAL LOW
UIBC: 169

## 2011-01-17 LAB — POCT HEMOGLOBIN-HEMACUE
Hemoglobin: 10.5 — ABNORMAL LOW
Operator id: 206361

## 2011-01-18 LAB — CBC
HCT: 33.7 — ABNORMAL LOW
Hemoglobin: 11.3 — ABNORMAL LOW
MCHC: 33.6
MCV: 89.2
Platelets: 213
RBC: 3.78 — ABNORMAL LOW
RDW: 20.2 — ABNORMAL HIGH
WBC: 6

## 2011-01-20 LAB — CBC
HCT: 28.9 — ABNORMAL LOW
HCT: 30.6 — ABNORMAL LOW
Hemoglobin: 9.8 — ABNORMAL LOW
Hemoglobin: 10.2 — ABNORMAL LOW
MCHC: 33.9
MCHC: 33.2
MCV: 89.7
MCV: 91
Platelets: 145 — ABNORMAL LOW
Platelets: 179
RBC: 3.22 — ABNORMAL LOW
RBC: 3.37 — ABNORMAL LOW
RDW: 20.3 — ABNORMAL HIGH
RDW: 20.4 — ABNORMAL HIGH
WBC: 6.4
WBC: 5

## 2011-01-20 LAB — IRON AND TIBC
Iron: 86
Saturation Ratios: 37
TIBC: 234
UIBC: 148

## 2011-01-20 LAB — FERRITIN: Ferritin: 207 (ref 22–322)

## 2011-01-21 ENCOUNTER — Encounter (HOSPITAL_COMMUNITY)
Admission: RE | Admit: 2011-01-21 | Discharge: 2011-01-21 | Payer: Medicare Other | Source: Ambulatory Visit | Attending: Nephrology | Admitting: Nephrology

## 2011-01-21 ENCOUNTER — Other Ambulatory Visit: Payer: Self-pay | Admitting: Nephrology

## 2011-01-21 LAB — CBC
HCT: 33.3 — ABNORMAL LOW
Hemoglobin: 11.2 — ABNORMAL LOW
MCHC: 33.6
MCV: 93.2
Platelets: 217
RBC: 3.58 — ABNORMAL LOW
RDW: 20 — ABNORMAL HIGH
WBC: 6.7

## 2011-01-21 LAB — POCT HEMOGLOBIN-HEMACUE: Hemoglobin: 9.8 g/dL — ABNORMAL LOW (ref 13.0–17.0)

## 2011-01-24 LAB — POCT HEMOGLOBIN-HEMACUE: Hemoglobin: 10.5 g/dL — ABNORMAL LOW (ref 13.0–17.0)

## 2011-01-25 LAB — POCT HEMOGLOBIN-HEMACUE
Hemoglobin: 10.2 g/dL — ABNORMAL LOW (ref 13.0–17.0)
Hemoglobin: 10.4 g/dL — ABNORMAL LOW (ref 13.0–17.0)
Hemoglobin: 11.3 g/dL — ABNORMAL LOW (ref 13.0–17.0)
Hemoglobin: 10.3 g/dL — ABNORMAL LOW (ref 13.0–17.0)

## 2011-01-26 LAB — IRON AND TIBC
Iron: 61
Saturation Ratios: 28
TIBC: 219
UIBC: 158

## 2011-01-26 LAB — POCT HEMOGLOBIN-HEMACUE: Hemoglobin: 9.6 — ABNORMAL LOW

## 2011-01-26 LAB — FERRITIN: Ferritin: 108 (ref 22–322)

## 2011-01-28 LAB — BASIC METABOLIC PANEL
BUN: 73 mg/dL — ABNORMAL HIGH (ref 6–23)
CO2: 22 mEq/L (ref 19–32)
Calcium: 8.9 mg/dL (ref 8.4–10.5)
Chloride: 106 mEq/L (ref 96–112)
Creatinine, Ser: 2.19 mg/dL — ABNORMAL HIGH (ref 0.4–1.5)
GFR calc Af Amer: 40 mL/min — ABNORMAL LOW (ref >60)
GFR calc non Af Amer: 33 mL/min — ABNORMAL LOW (ref >60)
Glucose, Bld: 117 mg/dL — ABNORMAL HIGH (ref 70–99)
Potassium: 3.2 mEq/L — ABNORMAL LOW (ref 3.5–5.1)
Sodium: 138 mEq/L (ref 135–145)

## 2011-01-28 LAB — URINALYSIS, ROUTINE W REFLEX MICROSCOPIC
Bilirubin Urine: NEGATIVE
Glucose, UA: NEGATIVE mg/dL
Hgb urine dipstick: NEGATIVE
Ketones, ur: NEGATIVE mg/dL
Nitrite: NEGATIVE
Protein, ur: NEGATIVE mg/dL
Specific Gravity, Urine: 1.013 (ref 1.005–1.030)
Urobilinogen, UA: 0.2 mg/dL (ref 0.0–1.0)
pH: 5.5 (ref 5.0–8.0)

## 2011-01-28 LAB — DIFFERENTIAL
Basophils Absolute: 0 10*3/uL (ref 0.0–0.1)
Basophils Relative: 0 % (ref 0–1)
Eosinophils Absolute: 0.2 10*3/uL (ref 0.0–0.7)
Eosinophils Relative: 2 % (ref 0–5)
Lymphocytes Relative: 9 % — ABNORMAL LOW (ref 12–46)
Lymphs Abs: 1 10*3/uL (ref 0.7–4.0)
Monocytes Absolute: 0.3 10*3/uL (ref 0.1–1.0)
Monocytes Relative: 2 % — ABNORMAL LOW (ref 3–12)
Neutro Abs: 9.8 10*3/uL — ABNORMAL HIGH (ref 1.7–7.7)
Neutrophils Relative %: 87 % — ABNORMAL HIGH (ref 43–77)

## 2011-01-28 LAB — IRON AND TIBC
Iron: 36 ug/dL — ABNORMAL LOW (ref 42–135)
Saturation Ratios: 15 % — ABNORMAL LOW (ref 20–55)
TIBC: 247 ug/dL (ref 215–435)
UIBC: 211 ug/dL

## 2011-01-28 LAB — CULTURE, BLOOD (ROUTINE X 2)
Culture: NO GROWTH
Culture: NO GROWTH

## 2011-01-28 LAB — POCT HEMOGLOBIN-HEMACUE
Hemoglobin: 10.6 g/dL — ABNORMAL LOW (ref 13.0–17.0)
Hemoglobin: 11.1 g/dL — ABNORMAL LOW (ref 13.0–17.0)

## 2011-01-28 LAB — URINE CULTURE
Colony Count: NO GROWTH
Culture: NO GROWTH

## 2011-01-28 LAB — CBC
HCT: 34.6 % — ABNORMAL LOW (ref 39.0–52.0)
Hemoglobin: 11.7 g/dL — ABNORMAL LOW (ref 13.0–17.0)
MCHC: 33.9 g/dL (ref 30.0–36.0)
MCV: 89.5 fL (ref 78.0–100.0)
Platelets: 222 10*3/uL (ref 150–400)
RBC: 3.87 MIL/uL — ABNORMAL LOW (ref 4.22–5.81)
RDW: 19.6 % — ABNORMAL HIGH (ref 11.5–15.5)
WBC: 11.3 10*3/uL — ABNORMAL HIGH (ref 4.0–10.5)

## 2011-01-28 LAB — FERRITIN: Ferritin: 39 ng/mL (ref 22–322)

## 2011-02-03 ENCOUNTER — Other Ambulatory Visit: Payer: Self-pay | Admitting: Nephrology

## 2011-02-03 ENCOUNTER — Encounter (HOSPITAL_COMMUNITY): Payer: Medicare Other | Attending: Nephrology

## 2011-02-03 DIAGNOSIS — D638 Anemia in other chronic diseases classified elsewhere: Secondary | ICD-10-CM | POA: Insufficient documentation

## 2011-02-03 DIAGNOSIS — N184 Chronic kidney disease, stage 4 (severe): Secondary | ICD-10-CM | POA: Insufficient documentation

## 2011-02-03 LAB — POCT HEMOGLOBIN-HEMACUE: Hemoglobin: 9.9 g/dL — ABNORMAL LOW (ref 13.0–17.0)

## 2011-02-07 LAB — IRON AND TIBC
Iron: 53
Saturation Ratios: 24
TIBC: 218
UIBC: 165

## 2011-02-07 LAB — CBC
HCT: 41
Hemoglobin: 13.7
MCHC: 33.3
MCV: 88
Platelets: 256
RBC: 4.67
RDW: 21 — ABNORMAL HIGH
WBC: 5.4

## 2011-02-07 LAB — BASIC METABOLIC PANEL
BUN: 100 — ABNORMAL HIGH
CO2: 27
Calcium: 9.1
Chloride: 102
Creatinine, Ser: 3.64 — ABNORMAL HIGH
GFR calc Af Amer: 22 — ABNORMAL LOW
GFR calc non Af Amer: 19 — ABNORMAL LOW
Glucose, Bld: 95
Potassium: 4.8
Sodium: 135

## 2011-02-07 LAB — POCT HEMOGLOBIN-HEMACUE
Hemoglobin: 13.6
Operator id: 208731

## 2011-02-07 LAB — POCT I-STAT CREATININE
Creatinine, Ser: 2.1 — ABNORMAL HIGH
Operator id: 208731

## 2011-02-07 LAB — FERRITIN: Ferritin: 159 (ref 22–322)

## 2011-02-09 LAB — CBC
HCT: 36.9 — ABNORMAL LOW
HCT: 38.3 — ABNORMAL LOW
Hemoglobin: 12.3 — ABNORMAL LOW
Hemoglobin: 12.6 — ABNORMAL LOW
MCHC: 33
MCHC: 33.3
MCV: 89
MCV: 90.4
Platelets: 254
Platelets: 258
RBC: 4.15 — ABNORMAL LOW
RBC: 4.23
RDW: 21.3 — ABNORMAL HIGH
RDW: 21.3 — ABNORMAL HIGH
WBC: 6.7
WBC: 6.9

## 2011-02-10 LAB — CBC
HCT: 35.8 — ABNORMAL LOW
Hemoglobin: 11.9 — ABNORMAL LOW
MCHC: 33.3
MCV: 90.8
Platelets: 363
RBC: 3.94 — ABNORMAL LOW
RDW: 21.2 — ABNORMAL HIGH
WBC: 6.7

## 2011-02-18 ENCOUNTER — Other Ambulatory Visit: Payer: Self-pay | Admitting: Nephrology

## 2011-02-18 ENCOUNTER — Encounter (HOSPITAL_COMMUNITY): Payer: Medicare Other

## 2011-02-18 LAB — POCT HEMOGLOBIN-HEMACUE: Hemoglobin: 9.6 g/dL — ABNORMAL LOW (ref 13.0–17.0)

## 2011-02-25 ENCOUNTER — Other Ambulatory Visit (HOSPITAL_COMMUNITY): Payer: Self-pay | Admitting: *Deleted

## 2011-03-04 ENCOUNTER — Encounter (HOSPITAL_COMMUNITY): Payer: Medicare Other

## 2011-03-04 ENCOUNTER — Encounter (HOSPITAL_COMMUNITY)
Admission: RE | Admit: 2011-03-04 | Discharge: 2011-03-04 | Disposition: A | Discharge status: Home / Self Care | Payer: Medicare Other | Source: Ambulatory Visit | Attending: Nephrology | Admitting: Nephrology

## 2011-03-04 DIAGNOSIS — D638 Anemia in other chronic diseases classified elsewhere: Secondary | ICD-10-CM | POA: Insufficient documentation

## 2011-03-04 DIAGNOSIS — N184 Chronic kidney disease, stage 4 (severe): Secondary | ICD-10-CM | POA: Insufficient documentation

## 2011-03-04 MED ORDER — FERUMOXYTOL INJECTION 510 MG/17 ML
INTRAVENOUS | Status: AC
  Administered 2011-03-04: 15:00:00 via INTRAVENOUS
  Filled 2011-03-04: qty 17

## 2011-03-04 MED ORDER — DARBEPOETIN ALFA-POLYSORBATE 500 MCG/ML IJ SOLN
200.0000 ug | INTRAMUSCULAR | Status: DC
  Filled 2011-03-04: qty 1

## 2011-03-04 MED ORDER — DARBEPOETIN ALFA-POLYSORBATE 200 MCG/0.4ML IJ SOLN
INTRAMUSCULAR | Status: AC
  Administered 2011-03-04: 15:00:00
  Filled 2011-03-04: qty 0.4

## 2011-03-07 LAB — POCT HEMOGLOBIN-HEMACUE: Hemoglobin: 9.9 g/dL — ABNORMAL LOW (ref 13.0–17.0)

## 2011-03-18 ENCOUNTER — Encounter (HOSPITAL_COMMUNITY)
Admission: RE | Admit: 2011-03-18 | Discharge: 2011-03-18 | Disposition: A | Discharge status: Home / Self Care | Payer: Medicare Other | Source: Ambulatory Visit | Attending: Nephrology | Admitting: Nephrology

## 2011-03-18 MED ORDER — FERUMOXYTOL INJECTION 510 MG/17 ML
510.0000 mg | INTRAVENOUS | Status: DC
  Administered 2011-03-18: 510 mg via INTRAVENOUS

## 2011-03-18 MED ORDER — DARBEPOETIN ALFA-POLYSORBATE 500 MCG/ML IJ SOLN
200.0000 ug | INTRAMUSCULAR | Status: DC
  Administered 2011-03-18: 200 ug via SUBCUTANEOUS

## 2011-03-21 MED FILL — Darbepoetin Alfa-Polysorbate 80 Soln Inj 200 MCG/0.4ML: INTRAMUSCULAR | Qty: 0.4 | Status: AC

## 2011-03-22 MED FILL — Darbepoetin Alfa-Polysorbate 80 Soln Inj 200 MCG/0.4ML: INTRAMUSCULAR | Qty: 0.4 | Status: AC

## 2011-03-28 LAB — POCT HEMOGLOBIN-HEMACUE: Hemoglobin: 9.7 g/dL — ABNORMAL LOW (ref 13.0–17.0)

## 2011-04-01 ENCOUNTER — Encounter (HOSPITAL_COMMUNITY)
Admission: RE | Admit: 2011-04-01 | Discharge: 2011-04-01 | Disposition: A | Discharge status: Home / Self Care | Payer: Medicare Other | Source: Ambulatory Visit | Attending: Nephrology | Admitting: Nephrology

## 2011-04-01 DIAGNOSIS — D638 Anemia in other chronic diseases classified elsewhere: Secondary | ICD-10-CM | POA: Insufficient documentation

## 2011-04-01 DIAGNOSIS — N184 Chronic kidney disease, stage 4 (severe): Secondary | ICD-10-CM | POA: Insufficient documentation

## 2011-04-01 LAB — POCT HEMOGLOBIN-HEMACUE: Hemoglobin: 10.3 g/dL — ABNORMAL LOW (ref 13.0–17.0)

## 2011-04-01 MED ORDER — DARBEPOETIN ALFA-POLYSORBATE 500 MCG/ML IJ SOLN
200.0000 ug | INTRAMUSCULAR | Status: DC
  Filled 2011-04-01: qty 1

## 2011-04-01 MED ORDER — DARBEPOETIN ALFA-POLYSORBATE 200 MCG/0.4ML IJ SOLN
INTRAMUSCULAR | Status: AC
  Administered 2011-04-01: 200 ug
  Filled 2011-04-01: qty 0.4

## 2011-04-12 ENCOUNTER — Other Ambulatory Visit (HOSPITAL_COMMUNITY): Payer: Self-pay | Admitting: *Deleted

## 2011-04-15 ENCOUNTER — Encounter (HOSPITAL_COMMUNITY): Payer: Medicare Other

## 2011-04-22 ENCOUNTER — Encounter (HOSPITAL_COMMUNITY): Payer: Medicare Other

## 2011-04-29 ENCOUNTER — Encounter (HOSPITAL_COMMUNITY)
Admission: RE | Admit: 2011-04-29 | Discharge: 2011-04-29 | Disposition: A | Discharge status: Home / Self Care | Payer: Medicare Other | Source: Ambulatory Visit | Attending: Nephrology | Admitting: Nephrology

## 2011-04-29 DIAGNOSIS — N184 Chronic kidney disease, stage 4 (severe): Secondary | ICD-10-CM | POA: Insufficient documentation

## 2011-04-29 DIAGNOSIS — D638 Anemia in other chronic diseases classified elsewhere: Secondary | ICD-10-CM | POA: Insufficient documentation

## 2011-04-29 LAB — POCT HEMOGLOBIN-HEMACUE: Hemoglobin: 9.9 g/dL — ABNORMAL LOW (ref 13.0–17.0)

## 2011-04-29 MED ORDER — DARBEPOETIN ALFA-POLYSORBATE 200 MCG/0.4ML IJ SOLN
INTRAMUSCULAR | Status: AC
  Filled 2011-04-29: qty 0.4

## 2011-04-29 MED ORDER — DARBEPOETIN ALFA-POLYSORBATE 500 MCG/ML IJ SOLN
200.0000 ug | INTRAMUSCULAR | Status: DC
  Administered 2011-04-29: 200 ug via SUBCUTANEOUS

## 2011-05-02 MED FILL — Darbepoetin Alfa-Polysorbate 80 Soln Inj 200 MCG/0.4ML: INTRAMUSCULAR | Qty: 0.4 | Status: AC

## 2011-05-13 ENCOUNTER — Encounter (HOSPITAL_COMMUNITY)
Admission: RE | Admit: 2011-05-13 | Discharge: 2011-05-13 | Disposition: A | Discharge status: Home / Self Care | Payer: Medicare Other | Source: Ambulatory Visit | Attending: Nephrology | Admitting: Nephrology

## 2011-05-13 LAB — POCT HEMOGLOBIN-HEMACUE: Hemoglobin: 9 g/dL — ABNORMAL LOW (ref 13.0–17.0)

## 2011-05-13 MED ORDER — DARBEPOETIN ALFA-POLYSORBATE 200 MCG/0.4ML IJ SOLN
INTRAMUSCULAR | Status: AC
  Administered 2011-05-13: 200 ug via INTRAVENOUS
  Filled 2011-05-13: qty 0.4

## 2011-05-13 MED ORDER — DARBEPOETIN ALFA-POLYSORBATE 200 MCG/0.4ML IJ SOLN
200.0000 ug | INTRAMUSCULAR | Status: DC
  Administered 2011-05-13: 200 ug via INTRAVENOUS

## 2011-05-24 ENCOUNTER — Other Ambulatory Visit (HOSPITAL_COMMUNITY): Payer: Self-pay | Admitting: *Deleted

## 2011-05-27 ENCOUNTER — Encounter (HOSPITAL_COMMUNITY): Payer: Medicare Other

## 2011-05-27 ENCOUNTER — Encounter (HOSPITAL_COMMUNITY)
Admission: RE | Admit: 2011-05-27 | Discharge: 2011-05-27 | Disposition: A | Discharge status: Home / Self Care | Payer: Medicare Other | Source: Ambulatory Visit | Attending: Nephrology | Admitting: Nephrology

## 2011-05-27 DIAGNOSIS — D638 Anemia in other chronic diseases classified elsewhere: Secondary | ICD-10-CM | POA: Insufficient documentation

## 2011-05-27 DIAGNOSIS — N184 Chronic kidney disease, stage 4 (severe): Secondary | ICD-10-CM | POA: Insufficient documentation

## 2011-05-27 LAB — POCT HEMOGLOBIN-HEMACUE: Hemoglobin: 10.5 g/dL — ABNORMAL LOW (ref 13.0–17.0)

## 2011-05-27 MED ORDER — DARBEPOETIN ALFA-POLYSORBATE 200 MCG/0.4ML IJ SOLN
INTRAMUSCULAR | Status: AC
  Administered 2011-05-27: 200 ug via SUBCUTANEOUS
  Filled 2011-05-27: qty 0.4

## 2011-05-27 MED ORDER — DARBEPOETIN ALFA-POLYSORBATE 200 MCG/0.4ML IJ SOLN: 200.0000 ug | INTRAMUSCULAR | Status: DC

## 2011-05-27 MED ORDER — FERUMOXYTOL INJECTION 510 MG/17 ML
INTRAVENOUS | Status: AC
  Administered 2011-05-27: 510 mg via INTRAVENOUS
  Filled 2011-05-27: qty 17

## 2011-05-27 MED ORDER — FERUMOXYTOL INJECTION 510 MG/17 ML: 510.0000 mg | Freq: Once | INTRAVENOUS | Status: DC

## 2011-06-10 ENCOUNTER — Encounter (HOSPITAL_COMMUNITY)
Admission: RE | Admit: 2011-06-10 | Discharge: 2011-06-10 | Disposition: A | Discharge status: Home / Self Care | Payer: Medicare Other | Source: Ambulatory Visit | Attending: Nephrology | Admitting: Nephrology

## 2011-06-10 LAB — POCT HEMOGLOBIN-HEMACUE: Hemoglobin: 9.6 g/dL — ABNORMAL LOW (ref 13.0–17.0)

## 2011-06-10 MED ORDER — DARBEPOETIN ALFA-POLYSORBATE 200 MCG/0.4ML IJ SOLN
200.0000 ug | INTRAMUSCULAR | Status: DC
  Administered 2011-06-10: 200 ug via INTRAVENOUS

## 2011-06-10 MED ORDER — DARBEPOETIN ALFA-POLYSORBATE 200 MCG/0.4ML IJ SOLN
INTRAMUSCULAR | Status: AC
  Administered 2011-06-10: 200 ug via INTRAVENOUS
  Filled 2011-06-10: qty 0.4

## 2011-06-24 ENCOUNTER — Encounter (HOSPITAL_COMMUNITY)
Admission: RE | Admit: 2011-06-24 | Discharge: 2011-06-24 | Disposition: A | Discharge status: Home / Self Care | Payer: Medicare Other | Source: Ambulatory Visit | Attending: Nephrology | Admitting: Nephrology

## 2011-06-24 DIAGNOSIS — D638 Anemia in other chronic diseases classified elsewhere: Secondary | ICD-10-CM | POA: Insufficient documentation

## 2011-06-24 DIAGNOSIS — N184 Chronic kidney disease, stage 4 (severe): Secondary | ICD-10-CM | POA: Insufficient documentation

## 2011-06-24 LAB — POCT HEMOGLOBIN-HEMACUE: Hemoglobin: 8.4 g/dL — ABNORMAL LOW (ref 13.0–17.0)

## 2011-06-24 MED ORDER — DARBEPOETIN ALFA-POLYSORBATE 200 MCG/0.4ML IJ SOLN
200.0000 ug | INTRAMUSCULAR | Status: DC
  Administered 2011-06-24: 200 ug via INTRAVENOUS
  Filled 2011-06-24: qty 0.4

## 2011-07-06 ENCOUNTER — Other Ambulatory Visit (HOSPITAL_COMMUNITY): Payer: Self-pay | Admitting: *Deleted

## 2011-07-08 ENCOUNTER — Encounter (HOSPITAL_COMMUNITY)
Admission: RE | Admit: 2011-07-08 | Discharge: 2011-07-08 | Disposition: A | Discharge status: Home / Self Care | Payer: Medicare Other | Source: Ambulatory Visit | Attending: Nephrology | Admitting: Nephrology

## 2011-07-08 LAB — POCT HEMOGLOBIN-HEMACUE: Hemoglobin: 8 g/dL — ABNORMAL LOW (ref 13.0–17.0)

## 2011-07-08 MED ORDER — DARBEPOETIN ALFA-POLYSORBATE 200 MCG/0.4ML IJ SOLN
200.0000 ug | INTRAMUSCULAR | Status: DC
  Administered 2011-07-08: 200 ug via INTRAVENOUS
  Filled 2011-07-08: qty 0.4

## 2011-07-11 LAB — POCT HEMOGLOBIN-HEMACUE: Hemoglobin: 7.9 g/dL — ABNORMAL LOW (ref 13.0–17.0)

## 2011-07-12 ENCOUNTER — Other Ambulatory Visit: Payer: Self-pay | Admitting: Nephrology

## 2011-07-12 DIAGNOSIS — R7989 Other specified abnormal findings of blood chemistry: Secondary | ICD-10-CM

## 2011-07-14 ENCOUNTER — Ambulatory Visit
Admission: RE | Admit: 2011-07-14 | Discharge: 2011-07-14 | Disposition: A | Discharge status: Home / Self Care | Payer: Medicare Other | Source: Ambulatory Visit | Attending: Nephrology | Admitting: Nephrology

## 2011-07-14 DIAGNOSIS — R7989 Other specified abnormal findings of blood chemistry: Secondary | ICD-10-CM

## 2011-07-21 ENCOUNTER — Other Ambulatory Visit (HOSPITAL_COMMUNITY): Payer: Self-pay | Admitting: *Deleted

## 2011-07-22 ENCOUNTER — Encounter (HOSPITAL_COMMUNITY): Payer: Medicare Other

## 2011-07-22 ENCOUNTER — Inpatient Hospital Stay (HOSPITAL_COMMUNITY)
Admission: EM | Admit: 2011-07-22 | Discharge: 2011-08-13 | DRG: 673 | Disposition: A | Discharge status: Home Health | Payer: Medicare Other | Attending: Family Medicine | Admitting: Family Medicine

## 2011-07-22 ENCOUNTER — Other Ambulatory Visit: Payer: Self-pay

## 2011-07-22 ENCOUNTER — Encounter (HOSPITAL_COMMUNITY): Payer: Self-pay | Admitting: *Deleted

## 2011-07-22 ENCOUNTER — Other Ambulatory Visit (HOSPITAL_COMMUNITY): Payer: Self-pay | Admitting: *Deleted

## 2011-07-22 ENCOUNTER — Encounter (HOSPITAL_COMMUNITY)
Admission: RE | Admit: 2011-07-22 | Discharge: 2011-07-22 | Disposition: A | Discharge status: Home / Self Care | Payer: Medicare Other | Source: Ambulatory Visit | Attending: Nephrology | Admitting: Nephrology

## 2011-07-22 ENCOUNTER — Emergency Department (HOSPITAL_COMMUNITY): Payer: Medicare Other

## 2011-07-22 DIAGNOSIS — Z89519 Acquired absence of unspecified leg below knee: Secondary | ICD-10-CM

## 2011-07-22 DIAGNOSIS — G4733 Obstructive sleep apnea (adult) (pediatric): Secondary | ICD-10-CM | POA: Diagnosis present

## 2011-07-22 DIAGNOSIS — R627 Adult failure to thrive: Secondary | ICD-10-CM | POA: Diagnosis present

## 2011-07-22 DIAGNOSIS — T8610 Unspecified complication of kidney transplant: Secondary | ICD-10-CM | POA: Diagnosis present

## 2011-07-22 DIAGNOSIS — E875 Hyperkalemia: Secondary | ICD-10-CM | POA: Diagnosis present

## 2011-07-22 DIAGNOSIS — Z794 Long term (current) use of insulin: Secondary | ICD-10-CM

## 2011-07-22 DIAGNOSIS — R0600 Dyspnea, unspecified: Secondary | ICD-10-CM

## 2011-07-22 DIAGNOSIS — E1142 Type 2 diabetes mellitus with diabetic polyneuropathy: Secondary | ICD-10-CM | POA: Diagnosis present

## 2011-07-22 DIAGNOSIS — N179 Acute kidney failure, unspecified: Principal | ICD-10-CM | POA: Diagnosis present

## 2011-07-22 DIAGNOSIS — E1121 Type 2 diabetes mellitus with diabetic nephropathy: Secondary | ICD-10-CM | POA: Diagnosis present

## 2011-07-22 DIAGNOSIS — R404 Transient alteration of awareness: Secondary | ICD-10-CM | POA: Diagnosis present

## 2011-07-22 DIAGNOSIS — R0609 Other forms of dyspnea: Secondary | ICD-10-CM | POA: Diagnosis present

## 2011-07-22 DIAGNOSIS — S88119A Complete traumatic amputation at level between knee and ankle, unspecified lower leg, initial encounter: Secondary | ICD-10-CM

## 2011-07-22 DIAGNOSIS — I509 Heart failure, unspecified: Secondary | ICD-10-CM | POA: Diagnosis present

## 2011-07-22 DIAGNOSIS — Z940 Kidney transplant status: Secondary | ICD-10-CM

## 2011-07-22 DIAGNOSIS — R599 Enlarged lymph nodes, unspecified: Secondary | ICD-10-CM | POA: Diagnosis present

## 2011-07-22 DIAGNOSIS — IMO0001 Reserved for inherently not codable concepts without codable children: Secondary | ICD-10-CM | POA: Diagnosis present

## 2011-07-22 DIAGNOSIS — D631 Anemia in chronic kidney disease: Secondary | ICD-10-CM | POA: Diagnosis present

## 2011-07-22 DIAGNOSIS — S98139A Complete traumatic amputation of one unspecified lesser toe, initial encounter: Secondary | ICD-10-CM

## 2011-07-22 DIAGNOSIS — N2581 Secondary hyperparathyroidism of renal origin: Secondary | ICD-10-CM | POA: Diagnosis present

## 2011-07-22 DIAGNOSIS — I5033 Acute on chronic diastolic (congestive) heart failure: Secondary | ICD-10-CM | POA: Diagnosis present

## 2011-07-22 DIAGNOSIS — Z992 Dependence on renal dialysis: Secondary | ICD-10-CM

## 2011-07-22 DIAGNOSIS — E1149 Type 2 diabetes mellitus with other diabetic neurological complication: Secondary | ICD-10-CM | POA: Diagnosis present

## 2011-07-22 DIAGNOSIS — N289 Disorder of kidney and ureter, unspecified: Secondary | ICD-10-CM

## 2011-07-22 DIAGNOSIS — N186 End stage renal disease: Secondary | ICD-10-CM | POA: Diagnosis present

## 2011-07-22 DIAGNOSIS — J189 Pneumonia, unspecified organism: Secondary | ICD-10-CM | POA: Diagnosis present

## 2011-07-22 DIAGNOSIS — N189 Chronic kidney disease, unspecified: Secondary | ICD-10-CM

## 2011-07-22 DIAGNOSIS — D649 Anemia, unspecified: Secondary | ICD-10-CM | POA: Diagnosis present

## 2011-07-22 LAB — COMPREHENSIVE METABOLIC PANEL
ALT: 57 U/L — ABNORMAL HIGH (ref 0–53)
AST: 51 U/L — ABNORMAL HIGH (ref 0–37)
Albumin: 1.4 g/dL — ABNORMAL LOW (ref 3.5–5.2)
Alkaline Phosphatase: 549 U/L — ABNORMAL HIGH (ref 39–117)
BUN: 111 mg/dL — ABNORMAL HIGH (ref 6–23)
CO2: 21 mEq/L (ref 19–32)
Calcium: 9.3 mg/dL (ref 8.4–10.5)
Chloride: 97 mEq/L (ref 96–112)
Creatinine, Ser: 4.41 mg/dL — ABNORMAL HIGH (ref 0.50–1.35)
GFR calc Af Amer: 17 mL/min — ABNORMAL LOW (ref >90)
GFR calc non Af Amer: 15 mL/min — ABNORMAL LOW (ref >90)
Glucose, Bld: 104 mg/dL — ABNORMAL HIGH (ref 70–99)
Potassium: 4.4 mEq/L (ref 3.5–5.1)
Sodium: 131 mEq/L — ABNORMAL LOW (ref 135–145)
Total Bilirubin: 0.6 mg/dL (ref 0.3–1.2)
Total Protein: 7.4 g/dL (ref 6.0–8.3)

## 2011-07-22 LAB — URINE MICROSCOPIC-ADD ON

## 2011-07-22 LAB — ABO/RH: ABO/RH(D): A POS

## 2011-07-22 LAB — URINALYSIS, ROUTINE W REFLEX MICROSCOPIC
Bilirubin Urine: NEGATIVE
Glucose, UA: NEGATIVE mg/dL
Hgb urine dipstick: NEGATIVE
Ketones, ur: NEGATIVE mg/dL
Leukocytes, UA: NEGATIVE
Nitrite: NEGATIVE
Protein, ur: 30 mg/dL — AB
Specific Gravity, Urine: 1.015 (ref 1.005–1.030)
Urobilinogen, UA: 1 mg/dL (ref 0.0–1.0)
pH: 5 (ref 5.0–8.0)

## 2011-07-22 LAB — CBC
HCT: 22.5 % — ABNORMAL LOW (ref 39.0–52.0)
Hemoglobin: 6.8 g/dL — CL (ref 13.0–17.0)
MCH: 26.7 pg (ref 26.0–34.0)
MCHC: 30.2 g/dL (ref 30.0–36.0)
MCV: 88.2 fL (ref 78.0–100.0)
Platelets: 500 10*3/uL — ABNORMAL HIGH (ref 150–400)
RBC: 2.55 MIL/uL — ABNORMAL LOW (ref 4.22–5.81)
RDW: 19.9 % — ABNORMAL HIGH (ref 11.5–15.5)
WBC: 10.4 10*3/uL (ref 4.0–10.5)

## 2011-07-22 LAB — DIFFERENTIAL
Basophils Absolute: 0 10*3/uL (ref 0.0–0.1)
Basophils Relative: 0 % (ref 0–1)
Eosinophils Absolute: 0.1 10*3/uL (ref 0.0–0.7)
Eosinophils Relative: 1 % (ref 0–5)
Lymphocytes Relative: 7 % — ABNORMAL LOW (ref 12–46)
Lymphs Abs: 0.8 10*3/uL (ref 0.7–4.0)
Monocytes Absolute: 1 10*3/uL (ref 0.1–1.0)
Monocytes Relative: 10 % (ref 3–12)
Neutro Abs: 8.5 10*3/uL — ABNORMAL HIGH (ref 1.7–7.7)
Neutrophils Relative %: 81 % — ABNORMAL HIGH (ref 43–77)

## 2011-07-22 LAB — GLUCOSE, CAPILLARY
Glucose-Capillary: 62 mg/dL — ABNORMAL LOW (ref 70–99)
Glucose-Capillary: 63 mg/dL — ABNORMAL LOW (ref 70–99)
Glucose-Capillary: 99 mg/dL (ref 70–99)

## 2011-07-22 LAB — CARDIAC PANEL(CRET KIN+CKTOT+MB+TROPI)
CK, MB: 1.4 ng/mL (ref 0.3–4.0)
Relative Index: INVALID (ref 0.0–2.5)
Total CK: 52 U/L (ref 7–232)
Troponin I: <0.3 ng/mL (ref <0.30)

## 2011-07-22 LAB — PREPARE RBC (CROSSMATCH)

## 2011-07-22 LAB — TROPONIN I: Troponin I: <0.3 ng/mL (ref <0.30)

## 2011-07-22 LAB — SAMPLE TO BLOOD BANK

## 2011-07-22 LAB — HEMOGLOBIN AND HEMATOCRIT, BLOOD
HCT: 23.1 % — ABNORMAL LOW (ref 39.0–52.0)
Hemoglobin: 7 g/dL — ABNORMAL LOW (ref 13.0–17.0)

## 2011-07-22 MED ORDER — EPOETIN ALFA 40000 UNIT/ML IJ SOLN
INTRAMUSCULAR | Status: AC
  Filled 2011-07-22: qty 1

## 2011-07-22 MED ORDER — CYCLOSPORINE MODIFIED (NEORAL) 100 MG PO CAPS
100.0000 mg | ORAL_CAPSULE | Freq: Two times a day (BID) | ORAL | Status: DC
  Administered 2011-07-23 – 2011-08-13 (×40): 100 mg via ORAL
  Filled 2011-07-22 (×47): qty 1

## 2011-07-22 MED ORDER — FERUMOXYTOL INJECTION 510 MG/17 ML
510.0000 mg | INTRAVENOUS | Status: DC
  Administered 2011-07-22: 510 mg via INTRAVENOUS
  Filled 2011-07-22: qty 17

## 2011-07-22 MED ORDER — PREDNISONE 5 MG PO TABS
5.0000 mg | ORAL_TABLET | Freq: Every day | ORAL | Status: DC
  Administered 2011-07-23 – 2011-08-12 (×18): 5 mg via ORAL
  Filled 2011-07-22 (×24): qty 1

## 2011-07-22 MED ORDER — SODIUM CHLORIDE 0.9 % IV SOLN
INTRAVENOUS | Status: DC
  Administered 2011-07-22: 14:00:00 via INTRAVENOUS

## 2011-07-22 MED ORDER — INSULIN ASPART 100 UNIT/ML ~~LOC~~ SOLN
0.0000 [IU] | Freq: Every day | SUBCUTANEOUS | Status: DC
  Administered 2011-07-23: 4 [IU] via SUBCUTANEOUS
  Administered 2011-07-25 – 2011-07-27 (×2): 2 [IU] via SUBCUTANEOUS
  Administered 2011-07-30 – 2011-08-01 (×3): 3 [IU] via SUBCUTANEOUS

## 2011-07-22 MED ORDER — ONDANSETRON HCL 4 MG/2ML IJ SOLN
4.0000 mg | Freq: Four times a day (QID) | INTRAMUSCULAR | Status: DC | PRN
  Administered 2011-07-29 – 2011-08-09 (×20): 4 mg via INTRAVENOUS
  Filled 2011-07-22 (×21): qty 2

## 2011-07-22 MED ORDER — PROMETHAZINE HCL 25 MG PO TABS
25.0000 mg | ORAL_TABLET | Freq: Four times a day (QID) | ORAL | Status: DC | PRN
  Administered 2011-07-30 – 2011-08-06 (×3): 25 mg via ORAL
  Filled 2011-07-22 (×4): qty 1

## 2011-07-22 MED ORDER — ROPINIROLE HCL 1 MG PO TABS
1.0000 mg | ORAL_TABLET | Freq: Every day | ORAL | Status: DC
  Administered 2011-07-22 – 2011-08-12 (×22): 1 mg via ORAL
  Filled 2011-07-22 (×24): qty 1

## 2011-07-22 MED ORDER — ALBUTEROL SULFATE HFA 108 (90 BASE) MCG/ACT IN AERS
1.0000 | INHALATION_SPRAY | Freq: Four times a day (QID) | RESPIRATORY_TRACT | Status: DC | PRN
  Administered 2011-08-04: 1 via RESPIRATORY_TRACT
  Filled 2011-07-22: qty 6.7

## 2011-07-22 MED ORDER — LEVOTHYROXINE SODIUM 75 MCG PO TABS
75.0000 ug | ORAL_TABLET | Freq: Every day | ORAL | Status: DC
  Administered 2011-07-23 – 2011-08-13 (×20): 75 ug via ORAL
  Filled 2011-07-22 (×24): qty 1

## 2011-07-22 MED ORDER — HYDROMORPHONE HCL PF 1 MG/ML IJ SOLN
1.0000 mg | INTRAMUSCULAR | Status: DC | PRN
  Administered 2011-07-23 – 2011-08-07 (×56): 1 mg via INTRAVENOUS
  Filled 2011-07-22 (×56): qty 1

## 2011-07-22 MED ORDER — ISOSORBIDE MONONITRATE ER 60 MG PO TB24
60.0000 mg | ORAL_TABLET | Freq: Every day | ORAL | Status: DC
  Administered 2011-07-22 – 2011-08-13 (×23): 60 mg via ORAL
  Filled 2011-07-22 (×23): qty 1

## 2011-07-22 MED ORDER — ACETAMINOPHEN 650 MG RE SUPP: 650.0000 mg | Freq: Four times a day (QID) | RECTAL | Status: DC | PRN

## 2011-07-22 MED ORDER — ONDANSETRON HCL 4 MG PO TABS
4.0000 mg | ORAL_TABLET | Freq: Four times a day (QID) | ORAL | Status: DC | PRN
  Administered 2011-07-29 – 2011-08-07 (×4): 4 mg via ORAL
  Filled 2011-07-22 (×5): qty 1

## 2011-07-22 MED ORDER — ACETAMINOPHEN 325 MG PO TABS
650.0000 mg | ORAL_TABLET | Freq: Four times a day (QID) | ORAL | Status: DC | PRN
  Administered 2011-08-01 – 2011-08-08 (×5): 650 mg via ORAL
  Filled 2011-07-22 (×5): qty 2

## 2011-07-22 MED ORDER — INSULIN ASPART 100 UNIT/ML ~~LOC~~ SOLN
0.0000 [IU] | Freq: Three times a day (TID) | SUBCUTANEOUS | Status: DC
  Administered 2011-07-23: 2 [IU] via SUBCUTANEOUS
  Administered 2011-07-23: 5 [IU] via SUBCUTANEOUS
  Administered 2011-07-24: 9 [IU] via SUBCUTANEOUS

## 2011-07-22 MED ORDER — SODIUM CHLORIDE 0.9 % IJ SOLN
3.0000 mL | Freq: Two times a day (BID) | INTRAMUSCULAR | Status: DC
  Administered 2011-07-22 – 2011-08-12 (×37): 3 mL via INTRAVENOUS

## 2011-07-22 MED ORDER — EPOETIN ALFA 40000 UNIT/ML IJ SOLN
40000.0000 [IU] | INTRAMUSCULAR | Status: DC
  Administered 2011-07-22: 40000 [IU] via SUBCUTANEOUS

## 2011-07-22 MED ORDER — HYDROCODONE-ACETAMINOPHEN 5-325 MG PO TABS
1.0000 | ORAL_TABLET | Freq: Four times a day (QID) | ORAL | Status: DC | PRN
  Administered 2011-07-23 – 2011-08-09 (×24): 1 via ORAL
  Filled 2011-07-22 (×25): qty 1

## 2011-07-22 MED ORDER — ALUM & MAG HYDROXIDE-SIMETH 200-200-20 MG/5ML PO SUSP
30.0000 mL | Freq: Four times a day (QID) | ORAL | Status: DC | PRN
  Administered 2011-07-23: 30 mL via ORAL
  Filled 2011-07-22 (×2): qty 30

## 2011-07-22 MED ORDER — METOCLOPRAMIDE HCL 5 MG PO TABS
5.0000 mg | ORAL_TABLET | Freq: Three times a day (TID) | ORAL | Status: DC
  Administered 2011-07-22 – 2011-08-12 (×75): 5 mg via ORAL
  Filled 2011-07-22 (×90): qty 1

## 2011-07-22 MED ORDER — LABETALOL HCL 300 MG PO TABS
300.0000 mg | ORAL_TABLET | Freq: Two times a day (BID) | ORAL | Status: DC
  Administered 2011-07-22 – 2011-08-03 (×22): 300 mg via ORAL
  Filled 2011-07-22 (×26): qty 1

## 2011-07-22 NOTE — Consult Note (Signed)
[facility] Renal Consultation Note  Requesting MD: Dr. Tana Coast Indication for Consultation: Acute on chronic renal failure in a renal transplant patient  HPI: Dale [name] is a 46 y.o. male with past medical history significant for end-stage renal disease secondary to diabetic nephropathy status post renal transplant in 1995 done at [name].  It sounds as if he's had some chronic allograft nephropathy with creatinine in the threes. He follows with Dr. Mercy Moore at [facility]. He states at the prospect of dialysis has not been brought up, however, Dr. Mercy Moore is focusing on the patient losing weight and he took that and a signal that he was going to need another kidney soon. The patient reports approximately 4 weeks ago he was hospitalized in [place] for 4 days. The diagnosis was CHF. He has been seen by Dr. Mercy Moore since discharge. Dr. Mercy Moore has been coordinating Procrit injections as well as feraheme repletion.  It looks as if he's had refractory anemia and hgb that has been dropping. Stool cards have been ordered and patient has them at home but he has not had time to complete them.  Patient also reports a couple week history of just not feeling well. He's had some fevers and cough which is nonproductive. He presented today for his Procrit injection looking bad. He relates the symptoms to the short stay staff. He had a hemoglobin checked which was in the sixes so was sent to the emergency department. A hemoglobin level of 6.8 was confirmed.  Also of note, he had a BUN of greater than 100 and a creatinine level of 4.4. Patient again reports a baseline creatinine of between 3 and 3.6. He believes that on the day of discharge from [facility] hospital his creatinine was actually 4.2. We will be following him for renal transplant needs an acute on chronic renal failure.  Creatinine, Ser  Date/Time Value Range Status  07/22/2011  2:57 PM 4.41* 0.50-1.35 (mg/dL) Final    04/28/2008  9:36 AM 1.83* 0.4-1.5 (mg/dL) Final  04/06/2008  8:43 PM 2.19* 0.4-1.5 (mg/dL) Final  11/28/2006  7:18 AM 2.1*  Final  11/23/2006 10:20 AM 3.64*  Final     PMHx:   Past Medical History  Diagnosis Date  . Renal disease   . Diabetes mellitus     Past Surgical History  Procedure Date  . Appendectomy   . Hernia repair   . Kidney transplant   . Cataract extraction   . Arteriovenous graft placement   . Av fistula placement   . Toe amputation   . Below knee leg amputation   . Carpal tunnel release   . Shoulder surgery     Family Hx: No family history on file.  Social History:  reports that he has never smoked. He does not have any smokeless tobacco history on file. He reports that he drinks alcohol. He reports that he does not use illicit drugs.  Allergies:  Allergies  Allergen Reactions  . Amoxicillin   . Ciprofloxacin   . Clindamycin/Lincomycin   . Codeine   . Levaquin   . Vasotec Cough    Medications: Prior to Admission medications   Medication Sig Start Date End Date Taking? Authorizing Provider  allopurinol (ZYLOPRIM) 300 MG tablet Take 300 mg by mouth Daily. 06/26/11  Yes Historical Provider, MD  ANDROGEL PUMP 20.25 MG/ACT (1.62%) GEL Apply 4 application topically Daily. 05/26/11  Yes Historical Provider, MD  aspirin 81 MG tablet Take 81 mg by mouth daily.  Yes Historical Provider, MD  cycloSPORINE modified (NEORAL/GENGRAF) 100 MG capsule Take 100 mg by mouth 2 (two) times daily.  06/26/11  Yes Historical Provider, MD  furosemide (LASIX) 80 MG tablet Take 80 mg by mouth Twice daily. 07/14/11  Yes Historical Provider, MD  Hydrocodone-Acetaminophen 5-300 MG TABS Take 1 tablet by mouth Every 6 hours as needed. For pain 07/14/11  Yes Historical Provider, MD  isosorbide mononitrate (IMDUR) 60 MG 24 hr tablet Take 60 mg by mouth Daily. 07/01/11  Yes Historical Provider, MD  labetalol (NORMODYNE) 300 MG tablet Take 300 mg by mouth Twice daily. 05/26/11  Yes Historical  Provider, MD  levothyroxine (SYNTHROID, LEVOTHROID) 75 MCG tablet Take 75 mg by mouth Daily. 06/26/11  Yes Historical Provider, MD  metoCLOPramide (REGLAN) 5 MG tablet Take 5 mg by mouth 4 (four) times daily - after meals and at bedtime. 05/26/11  Yes Historical Provider, MD  predniSONE (DELTASONE) 5 MG tablet Take 5 mg by mouth Daily. 06/26/11  Yes Historical Provider, MD  promethazine (PHENERGAN) 25 MG tablet Take 25 mg by mouth Every 6 hours as needed. For nausea 07/13/11  Yes Historical Provider, MD  PROVENTIL HFA 108 (90 BASE) MCG/ACT inhaler Inhale 1 puff into the lungs Every 6 hours as needed. For shortness of breath 05/17/11  Yes Historical Provider, MD  rOPINIRole (REQUIP) 1 MG tablet Take 1 mg by mouth Every evening. Take at 2000 07/18/11  Yes Historical Provider, MD  NOVOLOG 100 UNIT/ML injection Inject into the skin Continuous. 05/09/11   Historical Provider, MD    I have reviewed the patient's current medications.  Labs:  Results for orders placed during the hospital encounter of 07/22/11 (from the past 48 hour(s))  CBC     Status: Abnormal   Collection Time   07/22/11  2:57 PM      Component Value Range Comment   WBC 10.4  4.0 - 10.5 (K/uL)    RBC 2.55 (*) 4.22 - 5.81 (MIL/uL)    Hemoglobin 6.8 (*) 13.0 - 17.0 (g/dL)    HCT 22.5 (*) 39.0 - 52.0 (%)    MCV 88.2  78.0 - 100.0 (fL)    MCH 26.7  26.0 - 34.0 (pg)    MCHC 30.2  30.0 - 36.0 (g/dL)    RDW 19.9 (*) 11.5 - 15.5 (%)    Platelets 500 (*) 150 - 400 (K/uL)   DIFFERENTIAL     Status: Abnormal   Collection Time   07/22/11  2:57 PM      Component Value Range Comment   Neutrophils Relative 81 (*) 43 - 77 (%)    Neutro Abs 8.5 (*) 1.7 - 7.7 (K/uL)    Lymphocytes Relative 7 (*) 12 - 46 (%)    Lymphs Abs 0.8  0.7 - 4.0 (K/uL)    Monocytes Relative 10  3 - 12 (%)    Monocytes Absolute 1.0  0.1 - 1.0 (K/uL)    Eosinophils Relative 1  0 - 5 (%)    Eosinophils Absolute 0.1  0.0 - 0.7 (K/uL)    Basophils Relative 0  0 - 1 (%)     Basophils Absolute 0.0  0.0 - 0.1 (K/uL)   COMPREHENSIVE METABOLIC PANEL     Status: Abnormal   Collection Time   07/22/11  2:57 PM      Component Value Range Comment   Sodium 131 (*) 135 - 145 (mEq/L)    Potassium 4.4  3.5 - 5.1 (mEq/L)    Chloride  97  96 - 112 (mEq/L)    CO2 21  19 - 32 (mEq/L)    Glucose, Bld 104 (*) 70 - 99 (mg/dL)    BUN 111 (*) 6 - 23 (mg/dL)    Creatinine, Ser 4.41 (*) 0.50 - 1.35 (mg/dL)    Calcium 9.3  8.4 - 10.5 (mg/dL)    Total Protein 7.4  6.0 - 8.3 (g/dL)    Albumin 1.4 (*) 3.5 - 5.2 (g/dL)    AST 51 (*) 0 - 37 (U/L)    ALT 57 (*) 0 - 53 (U/L)    Alkaline Phosphatase 549 (*) 39 - 117 (U/L)    Total Bilirubin 0.6  0.3 - 1.2 (mg/dL)    GFR calc non Af Amer 15 (*) >90 (mL/min)    GFR calc Af Amer 17 (*) >90 (mL/min)   SAMPLE TO BLOOD BANK     Status: Normal   Collection Time   07/22/11  3:05 PM      Component Value Range Comment   Blood Bank Specimen SAMPLE AVAILABLE FOR TESTING      Sample Expiration 07/23/2011     TROPONIN I     Status: Normal   Collection Time   07/22/11  3:19 PM      Component Value Range Comment   Troponin I <0.30  <0.30 (ng/mL)   URINALYSIS, ROUTINE W REFLEX MICROSCOPIC     Status: Abnormal   Collection Time   07/22/11  3:59 PM      Component Value Range Comment   Color, Urine YELLOW  YELLOW     APPearance CLEAR  CLEAR     Specific Gravity, Urine 1.015  1.005 - 1.030     pH 5.0  5.0 - 8.0     Glucose, UA NEGATIVE  NEGATIVE (mg/dL)    Hgb urine dipstick NEGATIVE  NEGATIVE     Bilirubin Urine NEGATIVE  NEGATIVE     Ketones, ur NEGATIVE  NEGATIVE (mg/dL)    Protein, ur 30 (*) NEGATIVE (mg/dL)    Urobilinogen, UA 1.0  0.0 - 1.0 (mg/dL)    Nitrite NEGATIVE  NEGATIVE     Leukocytes, UA NEGATIVE  NEGATIVE    URINE MICROSCOPIC-ADD ON     Status: Abnormal   Collection Time   07/22/11  3:59 PM      Component Value Range Comment   Squamous Epithelial / LPF FEW (*) RARE     WBC, UA 0-2  <3 (WBC/hpf)    RBC / HPF 0-2  <3 (RBC/hpf)     Bacteria, UA FEW (*) RARE     Casts GRANULAR CAST (*) NEGATIVE  HYALINE CASTS     ROS: Positive for fatigue, fever, dry cough. He's also had decreased appetite with strange taste in mouth. He also has had some diarrhea. He denies nausea or vomiting he denies chest pain, shortness of breath, lower extremity edema. He denies any change in urine output or character. He had nosebleeds while in the hospital so has not been taking his baby aspirin. The remainder of the review of systems is negative    Physical Exam: Filed Vitals:   07/22/11 1631  BP: 122/64  Pulse: 79  Temp: 99 F (37.2 C)  Resp: 22     General: Patient appears to be fatigued. He is pale with pale mucosa. HEENT: Pupils are equal round reactive to light, extraocular meds are intact, mucous membranes are pale but moist Neck: There is no jugular venous distention Heart: Regular rate  and rhythm without murmur, gallop, or rub Lungs: Clear in all fields but he does have a prolonged expiratory phase and expiratory wheezes Abdomen: Morbidly obese. Positive bowel sounds, soft and nontender Extremities: Patient has a left BKA. His right lower extremity has no edema. Skin: Pale and easily bruising Neuro: Alert and oriented x3. He is very knowledgeable about his medical situation  Assessment/Plan: 46 year old white male with long-standing renal transplant that does appear to be failing. He now presents with a possible lung infection, anemia that may be out of proportion to his renal disease as well as some acute on chronic renal failure with uremia. 1.Renal- acute on chronic renal failure in the setting of renal transplantation. Although, his renal function baseline is not great. However, some of his symptoms could be uremic in nature and even though his creatinine had been elevated in the past I'm not sure his BUN is been over 100. My assessment of his volume status is that he is not volume overloaded but doesn't appear to be overly  dry. Therefore, he seems slightly dry to euvolemic. He will be getting blood transfused so will get some volume. I will hold his Lasix at least for the next day or 2. It's difficult to know if his overall sense of less well-being is due to his considerable anemia, uremia or infection. If he improves with treatment of anemia and infection then we would not consider initiating anything like dialysis therapy. However, if he fails to improve with treatment of these 2 parameters dialysis may be a consideration. 2. Hypertension/volume  - as above appears dry to euvolemic. I will hold his Lasix and he will be getting volume with blood. His labetalol can be continued. 4. Anemia  - seems slightly out of proportion to be due to just chronic kidney disease. I agree with checking stool cards and with transfusion. The trend of his hemoglobin will be monitored. He received procrit today 40,000 units as well as iron 5. Chronic immunosuppression- we will continue patient on his home dose of cyclosporine at 100 mg twice a day. 6. pulmonary symptoms and abnormal chest x-ray-per primary service patient will be started on antibiotics and an influenza test will be checked  Thank you for this consultation, we will follow with you.    Winston Sobczyk A 07/22/2011, 4:41 PM

## 2011-07-22 NOTE — Progress Notes (Signed)
RT Note: pt refused to wear CPAP tonight.

## 2011-07-22 NOTE — ED Notes (Signed)
Pt placed on 2L O2 via [place].

## 2011-07-22 NOTE — H&P (Signed)
History and Physical       Hospital Admission Note Date: 07/22/2011  Patient name: Dale [name] Medical record number: [id number] Date of birth: 1965-05-20 Age: 46 y.o. Gender: male PCP: Dr. Mercy Moore, [facility].  Attending physician: Mendel Corning, MD   Chief Complaint:  Increasing generalized weakness, fatigue, shortness of breath worsening in the last 1 month  HPI: Patient is a 46 year old male with past medical history of diabetes, end-stage renal disease secondary to diabetic nephropathy, renal transplant, chronic anemia, obstructive sleep apnea on CPAP initially presented to short stay at [facility] for IV iron and Procrit for his anemia. Patient was sent to the ED has hemoglobin of 6.8 was confirmed on the labs and patient being symptomatic. Patient states that his PCP, Dr. Mercy Moore has been coordinating Procrit injections and patient were given stool cards to rule out occult bleeding however he was not able to complete them. Patient denies any nausea, vomiting, hematochezia, melena or hematemesis. Patient reports increasing dyspnea and generalized weakness with fatigue and the last one month. He states that he had to receive blood transfusion 3 weeks ago at [facility]. He was read[facility] again [facility] when he was found to have vascular congestion and CHF exacerbation and he was treated with Bumex for diuresis. Patient also reports having low-grade fevers at home with nonproductive cough. He denies any chest pain or wheezing.  Patient reports some diarrhea with dry heaving but no obvious bleeding.  Review of Systems:  Constitutional: See history of present illness  HEENT: Denies photophobia, eye pain, redness, hearing loss, ear pain, congestion, sore throat, rhinorrhea, sneezing, mouth sores, trouble swallowing, neck pain, neck stiffness and tinnitus.   Respiratory: See history of  present illness  Cardiovascular: Denies chest pain, palpitations and leg swelling.  Gastrointestinal: Denies nausea, vomiting, abdominal pain, diarrhea, constipation, blood in stool and abdominal distention.  Genitourinary: Denies dysuria, urgency, frequency, hematuria, flank pain and difficulty urinating.  Musculoskeletal: Denies myalgias, back pain, joint swelling, arthralgias and gait problem.  Skin: Denies pallor, rash and wound.  Neurological: Patient feels overall generalized weakness, no syncopal episode Hematological: Denies adenopathy. Easy bruising, personal or family bleeding history  Psychiatric/Behavioral: Denies suicidal ideation, mood changes, confusion, nervousness, sleep disturbance and agitation  Past Medical History: Past Medical History  Diagnosis Date  . Renal disease   . Diabetes mellitus    Past Surgical History  Procedure Date  . Appendectomy   . Hernia repair   . Kidney transplant   . Cataract extraction   . Arteriovenous graft placement   . Av fistula placement   . Toe amputation   . Below knee leg amputation- left    . Carpal tunnel release   . Shoulder surgery     Medications: Prior to Admission medications   Medication Sig Start Date End Date Taking? Authorizing Provider  allopurinol (ZYLOPRIM) 300 MG tablet Take 300 mg by mouth Daily. 06/26/11  Yes Historical Provider, MD  ANDROGEL PUMP 20.25 MG/ACT (1.62%) GEL Apply 4 application topically Daily. 05/26/11  Yes Historical Provider, MD  aspirin 81 MG tablet Take 81 mg by mouth daily.   Yes Historical Provider, MD  cycloSPORINE modified (NEORAL/GENGRAF) 100 MG capsule Take 100 mg by mouth 2 (two) times daily.  06/26/11  Yes Historical Provider, MD  furosemide (LASIX) 80 MG tablet Take 80 mg by mouth Twice daily. 07/14/11  Yes Historical Provider, MD  Hydrocodone-Acetaminophen 5-300 MG TABS Take 1 tablet by mouth Every 6 hours  as needed. For pain 07/14/11  Yes Historical Provider, MD  isosorbide mononitrate  (IMDUR) 60 MG 24 hr tablet Take 60 mg by mouth Daily. 07/01/11  Yes Historical Provider, MD  labetalol (NORMODYNE) 300 MG tablet Take 300 mg by mouth Twice daily. 05/26/11  Yes Historical Provider, MD  levothyroxine (SYNTHROID, LEVOTHROID) 75 MCG tablet Take 75 mg by mouth Daily. 06/26/11  Yes Historical Provider, MD  metoCLOPramide (REGLAN) 5 MG tablet Take 5 mg by mouth 4 (four) times daily - after meals and at bedtime. 05/26/11  Yes Historical Provider, MD  predniSONE (DELTASONE) 5 MG tablet Take 5 mg by mouth Daily. 06/26/11  Yes Historical Provider, MD  promethazine (PHENERGAN) 25 MG tablet Take 25 mg by mouth Every 6 hours as needed. For nausea 07/13/11  Yes Historical Provider, MD  PROVENTIL HFA 108 (90 BASE) MCG/ACT inhaler Inhale 1 puff into the lungs Every 6 hours as needed. For shortness of breath 05/17/11  Yes Historical Provider, MD  rOPINIRole (REQUIP) 1 MG tablet Take 1 mg by mouth Every evening. Take at 2000 07/18/11  Yes Historical Provider, MD  NOVOLOG 100 UNIT/ML injection Inject into the skin Continuous. 05/09/11   Historical Provider, MD    Allergies:   Allergies  Allergen Reactions  . Amoxicillin   . Ciprofloxacin   . Clindamycin/Lincomycin   . Codeine   . Levaquin   . Vasotec Cough    Social History:  reports that he has never smoked. He does not have any smokeless tobacco history on file. He reports that he drinks alcohol. He reports that he does not use illicit drugs.  Family History: No family history on file.  Physical Exam: Blood pressure 122/64, pulse 79, temperature 99 F (37.2 C), temperature source Oral, resp. rate 22, height 5\' 8"  (1.727 m), weight 124.286 kg (274 lb), SpO2 98.00%. General: Alert, awake, oriented x3, in no acute distress. HEENT: anicteric sclera, pink conjunctiva, pupils equal and reactive to light and accomodation Neck: supple, no masses or lymphadenopathy, no goiter, no bruits  Heart: Regular rate and rhythm, without murmurs, rubs or  gallops. Lungs: Clear to auscultation bilaterally, no wheezing, rales or rhonchi. Abdomen: Obese Soft, nontender,  positive bowel sounds, no masses. Extremities: Left below-knee amputation . Neuro: Grossly intact, no focal neurological deficits, strength 5/5 upper and lower extremities bilaterally Psych: alert and oriented x 3, normal mood and affect Skin: no rashes or lesions, warm and dry   LABS on Admission:  Basic Metabolic Panel:  Lab 123XX123 1457  NA 131*  K 4.4  CL 97  CO2 21  GLUCOSE 104*  BUN 111*  CREATININE 4.41*  CALCIUM 9.3  MG --  PHOS --   Liver Function Tests:  Lab 07/22/11 1457  AST 51*  ALT 57*  ALKPHOS 549*  BILITOT 0.6  PROT 7.4  ALBUMIN 1.4*   CBC:  Lab 07/22/11 1457  WBC 10.4  NEUTROABS 8.5*  HGB 6.8*  HCT 22.5*  MCV 88.2  PLT 500*   Cardiac Enzymes:  Lab 07/22/11 1519  CKTOTAL --  CKMB --  CKMBINDEX --  TROPONINI <0.30     Radiological Exams on Admission: Dg Chest 2 View  07/22/2011  *RADIOLOGY REPORT*  Clinical Data: Fever for 1 week with cough, shortness of breath, hypertension and diabetes  CHEST - 2 VIEW  Comparison: 10/19/2004  Findings: Heart and mediastinal contours are stable with an unchanged degree of mild cardiomegaly.  The lung fields are notable for mild pulmonary vascular congestion.  No  overt congestive failure, interstitial septal lines or significant peribronchial cuffing is seen to suggest early interstitial edema. A slight increase in interstitial markings are seen diffusely and may indicate underlying viral pneumonitis.  No focal infiltrates are seen to suggest an area of focal bronchopneumonia.  No pleural fluid is seen.  Bony structures appear intact.  IMPRESSION: Mild pulmonary vascular congestion and mild increase in interstitial markings suspicious for viral pneumonitis given history of fever.  No findings suggestive of bronchopneumonia.  Original Report Authenticated By: Ander Gaster, M.D.   US Abdomen  Limited  07/14/2011  *RADIOLOGY REPORT*  Clinical Data:  Elevated liver function tests, history of renal transplantation  LIMITED ABDOMINAL ULTRASOUND - RIGHT UPPER QUADRANT  Comparison:  CT abdomen of 08/26/2004  Findings:  Gallbladder:  The gallbladder is visualized and no gallstones are noted.  There is no pain over the gallbladder with compression.  Common bile duct:  The common bile duct is normal measuring 5.0 mm in diameter.  Liver:  The liver is echogenic consistent with fatty infiltration. Also the liver measures 20.1 cm sagittally indicating hepatomegaly. No ductal dilatation is seen.  IMPRESSION:  1.  Fatty infiltration of the liver.  Hepatomegaly. 2.  No gallstones.                   Original Report Authenticated By: Joretta Bachelor, M.D.    Assessment/Plan Present on Admission:   .Anemia: Appears to be acute on chronic and worsening, could be due to chronic kidney disease however will rule out any obscure/occult GI bleed  - Patient states he had EGD and colonoscopy 'couple of years ago' by [facility] and was essentially normal - Patient will be admitted to telemetry, obtain stool occult test with stool studies for the diarrhea. Patient has no abdominal pain or tenderness. - Will type and screen and transfuse 2 units of packed RBCs, obtain serial H&H, continue PPI - If FOBT is positive, I will discuss with gastroenterology to rule out GIB. Hold aspirin   .CKD (chronic kidney disease)/end-stage renal disease status post renal transplant: Creatinine 4.4 - Renal service consulted, appreciate recommendations, discussed with Dr. Moshe Cipro   - After evaluating the patient, I feel that he is euvolemic, and maybe needs some volume, hold off on Lasix as well and continue with the blood transfusion.  .Obstructive sleep apnea - Will place him on CPAP, patient does not remember his home settings   .Dyspnea: Possibly secondary to acute on chronic anemia, ? Viral pneumonitis, CHF   .CHF  (congestive heart failure): Appears to be compensated  - Obtain cardiac enzymes, 2-D echo for further workup, hold off on Lasix   .Fevers/nonproductive cough:  - Chest x-ray reviewed, question of viral pneumonitis. Patient lung exam is fairly clear and has no leukocytosis.  - Will check flu PCR, however hold off on any antibiotics unless spikes fever  >100.3, check blood cultures, urine Legionella antigen, urine strep antigen -  Discussed with Dr. Karolee Ohs (ID) on the phone, recommended to place him on IV Zithromax and IV aztreonam he develops fevers or SIRS symptoms  .Diabetes mellitus - Obtain HbA1c, place on insulin sliding scale   DVT prophylaxis: No heparin or Lovenox given the significant anemia. Place SCD on the right lower extremity.  CODE STATUS: Full code   Further plan will depend as patient's clinical course evolves and further radiologic and laboratory data become available.   @Time  Spent on Admission: 1 hour Leverett Camplin M.D. [facility] 07/22/2011, 5:15  PM

## 2011-07-22 NOTE — ED Provider Notes (Signed)
History     CSN: [id number]  Arrival date & time 07/22/11  1418   First MD Initiated Contact with Patient 07/22/11 1459      Chief Complaint  Patient presents with  . Weakness  . Shortness of Breath    (Consider location/radiation/quality/duration/timing/severity/associated sxs/prior treatment) Patient is a 46 y.o. male presenting with weakness and shortness of breath. The history is provided by the patient.  Weakness The primary symptoms include fever. Primary symptoms do not include headaches, nausea or vomiting.  Additional symptoms include weakness. Additional symptoms do not include neck stiffness.  Shortness of Breath  Associated symptoms include a fever and shortness of breath. Pertinent negatives include no chest pain.   patient was sent in from short stay. he reported dyspnea and generalized weakness. He states he's been having temperatures at home. He was told to have an iron infusion and Procrit. He reportedly had a hemoglobin of 6.7 and was transferred to the ER for further evaluation. He states he's had some diarrhea. He is a kidney transplant recipient. He has an occasional cough. He was recently admitted to [facility], for CHF and states he got blood at that time.His wife states that he has been honestly been unable to stay awake  Past Medical History  Diagnosis Date  . Renal disease   . Diabetes mellitus     Past Surgical History  Procedure Date  . Appendectomy   . Hernia repair   . Kidney transplant   . Cataract extraction   . Arteriovenous graft placement   . Av fistula placement   . Toe amputation   . Below knee leg amputation   . Carpal tunnel release   . Shoulder surgery     No family history on file.  History  Substance Use Topics  . Smoking status: Never Smoker   . Smokeless tobacco: Not on file  . Alcohol Use: Yes      Review of Systems  Constitutional: Positive for fever and fatigue. Negative for activity change and appetite change.    HENT: Negative for neck stiffness.   Eyes: Negative for pain.  Respiratory: Positive for shortness of breath. Negative for chest tightness.   Cardiovascular: Negative for chest pain.  Gastrointestinal: Positive for diarrhea. Negative for nausea, vomiting and abdominal pain.  Genitourinary: Negative for flank pain.  Musculoskeletal: Negative for back pain.  Skin: Negative for rash.  Neurological: Positive for weakness. Negative for numbness and headaches.  Psychiatric/Behavioral: Negative for behavioral problems.    Allergies  Amoxicillin; Ciprofloxacin; Clindamycin/lincomycin; Codeine; Levaquin; and Vasotec  Home Medications   Current Outpatient Rx  Name Route Sig Dispense Refill  . ALLOPURINOL 300 MG PO TABS Oral Take 300 mg by mouth Daily.    . ANDROGEL PUMP 20.25 MG/ACT (1.62%) TD GEL Topical Apply 4 application topically Daily.    . ASPIRIN 81 MG PO TABS Oral Take 81 mg by mouth daily.    . CYCLOSPORINE MODIFIED 100 MG PO CAPS Oral Take 100 mg by mouth 2 (two) times daily.     . FUROSEMIDE 80 MG PO TABS Oral Take 80 mg by mouth Twice daily.    [name] Kitchen HYDROCODONE-ACETAMINOPHEN 5-300 MG PO TABS Oral Take 1 tablet by mouth Every 6 hours as needed. For pain    . ISOSORBIDE MONONITRATE ER 60 MG PO TB24 Oral Take 60 mg by mouth Daily.    [name] Kitchen LABETALOL HCL 300 MG PO TABS Oral Take 300 mg by mouth Twice daily.    [name] Kitchen LEVOTHYROXINE SODIUM  75 MCG PO TABS Oral Take 75 mg by mouth Daily.    [name] Kitchen METOCLOPRAMIDE HCL 5 MG PO TABS Oral Take 5 mg by mouth 4 (four) times daily - after meals and at bedtime.    [name] Kitchen PREDNISONE 5 MG PO TABS Oral Take 5 mg by mouth Daily.    [name] Kitchen PROMETHAZINE HCL 25 MG PO TABS Oral Take 25 mg by mouth Every 6 hours as needed. For nausea    . PROVENTIL HFA 108 (90 BASE) MCG/ACT IN AERS Inhalation Inhale 1 puff into the lungs Every 6 hours as needed. For shortness of breath    . ROPINIROLE HCL 1 MG PO TABS Oral Take 1 mg by mouth Every evening. Take at 2000    . NOVOLOG 100 UNIT/ML [place]  SOLN Subcutaneous Inject into the skin Continuous.      BP 122/64  Pulse 79  Temp(Src) 99 F (37.2 C) (Oral)  Resp 22  Ht 5\' 8"  (1.727 m)  Wt 274 lb (124.286 kg)  BMI 41.66 kg/m2  SpO2 98%  Physical Exam  Nursing note and vitals reviewed. Constitutional: He is oriented to person, place, and time. He appears well-developed and well-nourished.  HENT:  Head: Normocephalic and atraumatic.  Eyes: EOM are normal. Pupils are equal, round, and reactive to light.  Neck: Normal range of motion. Neck supple.  Cardiovascular: Normal rate, regular rhythm and normal heart sounds.   No murmur heard. Pulmonary/Chest: Effort normal and breath sounds normal.  Abdominal: Soft. Bowel sounds are normal. He exhibits no distension and no mass. There is no tenderness. There is no rebound and no guarding.  Musculoskeletal: Normal range of motion. He exhibits no edema.       Previous BKA to left lower extremity chronic changes to right lower leg. Left forearm old dialysis graft.  Neurological: He is alert and oriented to person, place, and time. No cranial nerve deficit.  Skin: Skin is warm and dry. There is pallor.  Psychiatric: He has a normal mood and affect.    ED Course  Procedures (including critical care time)  Labs Reviewed  CBC - Abnormal; Notable for the following:    RBC 2.55 (*)    Hemoglobin 6.8 (*)    HCT 22.5 (*)    RDW 19.9 (*)    Platelets 500 (*)    All other components within normal limits  DIFFERENTIAL - Abnormal; Notable for the following:    Neutrophils Relative 81 (*)    Neutro Abs 8.5 (*)    Lymphocytes Relative 7 (*)    All other components within normal limits  COMPREHENSIVE METABOLIC PANEL - Abnormal; Notable for the following:    Sodium 131 (*)    Glucose, Bld 104 (*)    BUN 111 (*)    Creatinine, Ser 4.41 (*)    Albumin 1.4 (*)    AST 51 (*)    ALT 57 (*)    Alkaline Phosphatase 549 (*)    GFR calc non Af Amer 15 (*)    GFR calc Af Amer 17 (*)    All other  components within normal limits  URINALYSIS, ROUTINE W REFLEX MICROSCOPIC - Abnormal; Notable for the following:    Protein, ur 30 (*)    All other components within normal limits  URINE MICROSCOPIC-ADD ON - Abnormal; Notable for the following:    Squamous Epithelial / LPF FEW (*)    Bacteria, UA FEW (*)    Casts GRANULAR CAST (*) HYALINE CASTS  All other components within normal limits  SAMPLE TO BLOOD BANK  TROPONIN I  OCCULT BLOOD X 1 CARD TO LAB, STOOL  FECAL LACTOFERRIN  CLOSTRIDIUM DIFFICILE BY PCR   Dg Chest 2 View  07/22/2011  *RADIOLOGY REPORT*  Clinical Data: Fever for 1 week with cough, shortness of breath, hypertension and diabetes  CHEST - 2 VIEW  Comparison: 10/19/2004  Findings: Heart and mediastinal contours are stable with an unchanged degree of mild cardiomegaly.  The lung fields are notable for mild pulmonary vascular congestion.  No overt congestive failure, interstitial septal lines or significant peribronchial cuffing is seen to suggest early interstitial edema. A slight increase in interstitial markings are seen diffusely and may indicate underlying viral pneumonitis.  No focal infiltrates are seen to suggest an area of focal bronchopneumonia.  No pleural fluid is seen.  Bony structures appear intact.  IMPRESSION: Mild pulmonary vascular congestion and mild increase in interstitial markings suspicious for viral pneumonitis given history of fever.  No findings suggestive of bronchopneumonia.  Original Report Authenticated By: Ander Gaster, M.D.     1. Anemia   2. Renal insufficiency     Date: 07/22/2011  Rate: 79  Rhythm: normal sinus rhythm  QRS Axis: normal  Intervals: normal  ST/T Wave abnormalities: normal  Conduction Disutrbances:none  Narrative Interpretation:   Old EKG Reviewed: unchanged     MDM  Patient was sent in from short  stay with a hemoglobin of 6.7. He was supposed to get a transfusion of iron and Procrit. He's also had fevers. His  hemoglobin is lower than here.Heissymptomaticwithit.He'll be admitted to medicine with a nephrology consult.        Jasper Riling. Alvino Chapel, MD 07/22/11 1704

## 2011-07-22 NOTE — ED Notes (Signed)
Hgb 6.8 reported to [place]

## 2011-07-22 NOTE — Progress Notes (Signed)
Pt arrived via stretcher from the ED. Pt lives home with his wife, and has an old left BKA and has his prosthesis at the bedside. Pt has old amputations to his right 1st and 5th toes. Pt has dry cough, and A/Ox4. Pt right lower leg is discolored, pt said its been discolored for a long time. He has an insulin pump to his right lower quadrant. His wife says that his meter is at home. Pt placed on contact and telemetry. Will continue to monitor.

## 2011-07-22 NOTE — ED Notes (Signed)
[id number] Ready

## 2011-07-22 NOTE — ED Notes (Addendum)
Patient was to have iron infusion in short stay today,  He was sent to the ED due to having hgb of 6.7.  Patient does admit to having increased sob and weakness.  Wbc has been elevated recently as well.  Patient with new onset of cough as well.  Patient reports no appetite and diarrhea.   Patient is jaundiced in color

## 2011-07-23 DIAGNOSIS — I509 Heart failure, unspecified: Secondary | ICD-10-CM

## 2011-07-23 LAB — CBC
HCT: 26.6 % — ABNORMAL LOW (ref 39.0–52.0)
HCT: 27.2 % — ABNORMAL LOW (ref 39.0–52.0)
Hemoglobin: 8.5 g/dL — ABNORMAL LOW (ref 13.0–17.0)
Hemoglobin: 8.3 g/dL — ABNORMAL LOW (ref 13.0–17.0)
MCH: 27.6 pg (ref 26.0–34.0)
MCH: 26.3 pg (ref 26.0–34.0)
MCHC: 32 g/dL (ref 30.0–36.0)
MCHC: 30.5 g/dL (ref 30.0–36.0)
MCV: 86.4 fL (ref 78.0–100.0)
MCV: 86.3 fL (ref 78.0–100.0)
Platelets: 465 10*3/uL — ABNORMAL HIGH (ref 150–400)
Platelets: 461 10*3/uL — ABNORMAL HIGH (ref 150–400)
RBC: 3.08 MIL/uL — ABNORMAL LOW (ref 4.22–5.81)
RBC: 3.15 MIL/uL — ABNORMAL LOW (ref 4.22–5.81)
RDW: 19.8 % — ABNORMAL HIGH (ref 11.5–15.5)
RDW: 19.9 % — ABNORMAL HIGH (ref 11.5–15.5)
WBC: 11.4 10*3/uL — ABNORMAL HIGH (ref 4.0–10.5)
WBC: 11.1 10*3/uL — ABNORMAL HIGH (ref 4.0–10.5)

## 2011-07-23 LAB — BASIC METABOLIC PANEL
BUN: 113 mg/dL — ABNORMAL HIGH (ref 6–23)
BUN: 111 mg/dL — ABNORMAL HIGH (ref 6–23)
CO2: 23 mEq/L (ref 19–32)
CO2: 23 mEq/L (ref 19–32)
Calcium: 9.6 mg/dL (ref 8.4–10.5)
Calcium: 9.4 mg/dL (ref 8.4–10.5)
Chloride: 97 mEq/L (ref 96–112)
Chloride: 95 mEq/L — ABNORMAL LOW (ref 96–112)
Creatinine, Ser: 4.6 mg/dL — ABNORMAL HIGH (ref 0.50–1.35)
Creatinine, Ser: 4.43 mg/dL — ABNORMAL HIGH (ref 0.50–1.35)
GFR calc Af Amer: 16 mL/min — ABNORMAL LOW (ref >90)
GFR calc Af Amer: 17 mL/min — ABNORMAL LOW (ref >90)
GFR calc non Af Amer: 14 mL/min — ABNORMAL LOW (ref >90)
GFR calc non Af Amer: 15 mL/min — ABNORMAL LOW (ref >90)
Glucose, Bld: 58 mg/dL — ABNORMAL LOW (ref 70–99)
Glucose, Bld: 71 mg/dL (ref 70–99)
Potassium: 4.3 mEq/L (ref 3.5–5.1)
Potassium: 4.7 mEq/L (ref 3.5–5.1)
Sodium: 134 mEq/L — ABNORMAL LOW (ref 135–145)
Sodium: 132 mEq/L — ABNORMAL LOW (ref 135–145)

## 2011-07-23 LAB — GLUCOSE, CAPILLARY
Glucose-Capillary: 100 mg/dL — ABNORMAL HIGH (ref 70–99)
Glucose-Capillary: 44 mg/dL — CL (ref 70–99)
Glucose-Capillary: 51 mg/dL — ABNORMAL LOW (ref 70–99)
Glucose-Capillary: 56 mg/dL — ABNORMAL LOW (ref 70–99)
Glucose-Capillary: 86 mg/dL (ref 70–99)
Glucose-Capillary: 185 mg/dL — ABNORMAL HIGH (ref 70–99)
Glucose-Capillary: 297 mg/dL — ABNORMAL HIGH (ref 70–99)
Glucose-Capillary: 326 mg/dL — ABNORMAL HIGH (ref 70–99)

## 2011-07-23 LAB — CARDIAC PANEL(CRET KIN+CKTOT+MB+TROPI)
CK, MB: 1.5 ng/mL (ref 0.3–4.0)
Relative Index: INVALID (ref 0.0–2.5)
Total CK: 77 U/L (ref 7–232)
Troponin I: <0.3 ng/mL (ref <0.30)

## 2011-07-23 LAB — INFLUENZA PANEL BY PCR (TYPE A & B)
H1N1 flu by pcr: NOT DETECTED
Influenza A By PCR: NEGATIVE
Influenza B By PCR: NEGATIVE

## 2011-07-23 LAB — STREP PNEUMONIAE URINARY ANTIGEN: Strep Pneumo Urinary Antigen: NEGATIVE

## 2011-07-23 LAB — HEMOGLOBIN A1C
Hgb A1c MFr Bld: 6.9 % — ABNORMAL HIGH (ref <5.7)
Mean Plasma Glucose: 151 mg/dL — ABNORMAL HIGH (ref <117)

## 2011-07-23 MED ORDER — INSULIN GLARGINE 100 UNIT/ML ~~LOC~~ SOLN
20.0000 [IU] | Freq: Every day | SUBCUTANEOUS | Status: DC
  Administered 2011-07-23 – 2011-07-25 (×3): 20 [IU] via SUBCUTANEOUS

## 2011-07-23 NOTE — Progress Notes (Signed)
2030 this pm, spoke with patient regarding his insulin pump being discontinued earlier in the day.  Pt upset that it was removed after hypoglycemia this am.  He states that low blood sugars happen at home and he just deals with it.  He stated that he had been diabetic for 35 years and is Type I.  Pt is currently on correction insulin TID AC and HS.  No basal insulin on order.  Requested pt to look up his basal rates on pump.  Pt was able to show nursing staff a 24hr basal dose of 39.5 units as well as the hourly breakdown.  Spoke with Kathline Magic, NP for [facility] and Dr. Jonnie Finner regarding basal insulin needs.  MD ordered 20 units lantus qHS.  Pt verbalized his understanding of the current regimen and requested that staff recheck his glucose at 3am.  Pt reassured that we recheck at 0300 and encouraged to call at anytime if he wanted checked sooner.  Will continue to monitor.

## 2011-07-23 NOTE — Progress Notes (Signed)
Subjective:  Looks like he does not feel good.  Still pale.  When I asked him did the blood help "maybe" Objective Vital signs in last 24 hours: Filed Vitals:   07/23/11 0216 07/23/11 0248 07/23/11 0500 07/23/11 0830  BP: 150/81 138/88  139/75  Pulse: 87 88 79 88  Temp: 99 F (37.2 C) 98.8 F (37.1 C) 98.4 F (36.9 C) 98 F (36.7 C)  TempSrc: Oral Oral  Oral  Resp: 20 22 20 20   Height:      Weight:      SpO2:   98% 97%   Weight change:   Intake/Output Summary (Last 24 hours) at 07/23/11 0916 Last data filed at 07/23/11 0700  Gross per 24 hour  Intake  517.5 ml  Output   1400 ml  Net -882.5 ml   Labs: Basic Metabolic Panel:  Lab [id number] 0700 07/22/11 1457  NA 134* 131*  K 4.3 4.4  CL 97 97  CO2 23 21  GLUCOSE 58* 104*  BUN 113* 111*  CREATININE 4.60* 4.41*  CALCIUM 9.6 9.3  ALB -- --  PHOS -- --   Liver Function Tests:  Lab 07/22/11 1457  AST 51*  ALT 57*  ALKPHOS 549*  BILITOT 0.6  PROT 7.4  ALBUMIN 1.4*   No results found for this basename: LIPASE:3,AMYLASE:3 in the last 168 hours No results found for this basename: AMMONIA:3 in the last 168 hours CBC:  Lab 07/23/11 0848 07/23/11 0700 07/22/11 1942 07/22/11 1457  WBC 11.1* 11.4* -- 10.4  NEUTROABS -- -- -- 8.5*  HGB 8.3* 8.5* 7.0* --  HCT 27.2* 26.6* 23.1* --  MCV 86.3 86.4 -- 88.2  PLT 461* 465* -- 500*   Cardiac Enzymes:  Lab 07/23/11 0500 07/22/11 1942 07/22/11 1519  CKTOTAL 77 52 --  CKMB 1.5 1.4 --  CKMBINDEX -- -- --  TROPONINI <0.30 <0.30 <0.30   CBG:  Lab 07/23/11 0738 07/23/11 0310 07/22/11 2151 07/22/11 1936 07/22/11 1846  GLUCAP 51* 100* 99 63* 62*    Iron Studies: No results found for this basename: IRON,TIBC,TRANSFERRIN,FERRITIN in the last 72 hours Studies/Results: Dg Chest 2 View  07/22/2011  *RADIOLOGY REPORT*  Clinical Data: Fever for 1 week with cough, shortness of breath, hypertension and diabetes  CHEST - 2 VIEW  Comparison: 10/19/2004  Findings: Heart and  mediastinal contours are stable with an unchanged degree of mild cardiomegaly.  The lung fields are notable for mild pulmonary vascular congestion.  No overt congestive failure, interstitial septal lines or significant peribronchial cuffing is seen to suggest early interstitial edema. A slight increase in interstitial markings are seen diffusely and may indicate underlying viral pneumonitis.  No focal infiltrates are seen to suggest an area of focal bronchopneumonia.  No pleural fluid is seen.  Bony structures appear intact.  IMPRESSION: Mild pulmonary vascular congestion and mild increase in interstitial markings suspicious for viral pneumonitis given history of fever.  No findings suggestive of bronchopneumonia.  Original Report Authenticated By: Ander Gaster, M.D.   Medications: Infusions:    Scheduled Medications:    . cycloSPORINE modified  100 mg Oral BID  . insulin aspart  0-5 Units Subcutaneous QHS  . insulin aspart  0-9 Units Subcutaneous TID WC  . isosorbide mononitrate  60 mg Oral Daily  . labetalol  300 mg Oral BID  . levothyroxine  75 mcg Oral QAC breakfast  . metoCLOPramide  5 mg Oral TID PC & HS  . predniSONE  5 mg Oral  Q breakfast  . rOPINIRole  1 mg Oral QHS  . sodium chloride  3 mL Intravenous Q12H    have reviewed scheduled and prn medications.  Physical Exam: General: looks as if he does not feel well Heart: RRR Lungs: poor effort, mostly clear Abdomen: obese, soft Extremities: no peripheral edema Dialysis Access: none.  Has old AVF on Left arm   I Assessment/ Plan: Pt is a 46 y.o. yo male who was admitted on 07/22/2011 with  S/p renal transplant which appears to be failing.  Now with failure to thrive likely multifactorial anemia/uremia/infectious etiology  Assessment/Plan: 1. S/p renal transplant with chronic allograft nephropathy and now some acute on chronic renal failure.  Not sure if hemodynamically mediated, ATN due to recent diuresis during hosp for  CHF.  Holding diuretics, is making urine.  Need to continue to monitor and if patient does not improve by numbers or clinically over the next 48 hours, I am afraid will need reinitiation of dialysis.  Patient seems to understand what I am saying. Also previously did PD, may want to do that again in the future if RRT is needed 2. Anemia- did not see result of stool cards.  Patient received procrit 40, 000 units yesterday as OP, also iron and is s/p transfusion last night.  Will continue to monitor 3. ID- not sure if has infection going on.  Supportive care with no antibiotics unless obvious indication.  In isolation in case of flu/virus. 4. S/p renal transplant- continue home doses of cyclosporine and prednisone.  5. Volume- would continue to hold lasix and follow   Mildreth Reek A   07/23/2011,9:16 AM  LOS: 1 day

## 2011-07-23 NOTE — Progress Notes (Signed)
Spoke with Dr.Rai re: cbgs. Pt instructed to turn off insulin pump till Diabetes Educator sees on Mon. Will manage with SSI.Pt stated he understood,pump turned off. Pt eating cereal,did not eat breakfast except for eggs and juice.

## 2011-07-23 NOTE — Progress Notes (Signed)
  Echocardiogram 2D Echocardiogram has been performed.  Isaac Bliss 07/23/2011, 11:29 AM

## 2011-07-23 NOTE — Progress Notes (Signed)
Patient ID: Dale [name]  male  [id number]    DOB: 1966-03-22    DOA: 07/22/2011  PCP: No primary provider on file.  Subjective: Does not feel good today, states that pain from fibromyalgia bothering him, explained him to ask for pain medications if needed.  Objective: Weight change:   Intake/Output Summary (Last 24 hours) at 07/23/11 1133 Last data filed at 07/23/11 0700  Gross per 24 hour  Intake  517.5 ml  Output   1400 ml  Net -882.5 ml   Blood pressure 139/75, pulse 88, temperature 98 F (36.7 C), temperature source Oral, resp. rate 20, height 5\' 8"  (1.727 m), weight 124.286 kg (274 lb), SpO2 97.00%.  Physical Exam: General: Alert and awake, oriented x3, not in any acute distress. HEENT: anicteric sclera, pupils reactive to light and accommodation, EOMI CVS: S1-S2 clear, no murmur rubs or gallops Chest: clear to auscultation bilaterally, no wheezing, rales or rhonchi Abdomen: soft nontender, nondistended, normal bowel sounds, no organomegaly Extremities: Left below-knee amputation  Neuro: Cranial nerves II-XII intact, no focal neurological deficits  Lab Results: Basic Metabolic Panel:  Lab [id number] 0848 07/23/11 0700  NA 132* 134*  K 4.7 4.3  CL 95* 97  CO2 23 23  GLUCOSE 71 58*  BUN 111* 113*  CREATININE 4.43* 4.60*  CALCIUM 9.4 9.6  MG -- --  PHOS -- --   Liver Function Tests:  Lab 07/22/11 1457  AST 51*  ALT 57*  ALKPHOS 549*  BILITOT 0.6  PROT 7.4  ALBUMIN 1.4*   CBC:  Lab 07/23/11 0848 07/23/11 0700 07/22/11 1457  WBC 11.1* 11.4* --  NEUTROABS -- -- 8.5*  HGB 8.3* 8.5* --  HCT 27.2* 26.6* --  MCV 86.3 86.4 --  PLT 461* 465* --   Cardiac Enzymes:  Lab 07/23/11 0500 07/22/11 1942 07/22/11 1519  CKTOTAL 77 52 --  CKMB 1.5 1.4 --  CKMBINDEX -- -- --  TROPONINI <0.30 <0.30 <0.30   BNP: No components found with this basename: POCBNP:2 CBG:  Lab 07/23/11 0738 07/23/11 0310 07/22/11 2151 07/22/11 1936 07/22/11 1846  GLUCAP 51* 100* 99 63*  62*     Micro Results: No results found for this or any previous visit (from the past 240 hour(s)).  Studies/Results: Dg Chest 2 View  07/22/2011  *RADIOLOGY REPORT*  Clinical Data: Fever for 1 week with cough, shortness of breath, hypertension and diabetes  CHEST - 2 VIEW  Comparison: 10/19/2004  Findings: Heart and mediastinal contours are stable with an unchanged degree of mild cardiomegaly.  The lung fields are notable for mild pulmonary vascular congestion.  No overt congestive failure, interstitial septal lines or significant peribronchial cuffing is seen to suggest early interstitial edema. A slight increase in interstitial markings are seen diffusely and may indicate underlying viral pneumonitis.  No focal infiltrates are seen to suggest an area of focal bronchopneumonia.  No pleural fluid is seen.  Bony structures appear intact.  IMPRESSION: Mild pulmonary vascular congestion and mild increase in interstitial markings suspicious for viral pneumonitis given history of fever.  No findings suggestive of bronchopneumonia.  Original Report Authenticated By: Ander Gaster, M.D.   US Abdomen Limited  07/14/2011  *RADIOLOGY REPORT*  Clinical Data:  Elevated liver function tests, history of renal transplantation  LIMITED ABDOMINAL ULTRASOUND - RIGHT UPPER QUADRANT  Comparison:  CT abdomen of 08/26/2004  Findings:  Gallbladder:  The gallbladder is visualized and no gallstones are noted.  There is no pain over the gallbladder with compression.  Common bile duct:  The common bile duct is normal measuring 5.0 mm in diameter.  Liver:  The liver is echogenic consistent with fatty infiltration. Also the liver measures 20.1 cm sagittally indicating hepatomegaly. No ductal dilatation is seen.  IMPRESSION:  1.  Fatty infiltration of the liver.  Hepatomegaly. 2.  No gallstones.                   Original Report Authenticated By: Joretta Bachelor, M.D.    Medications: Scheduled Meds:   . cycloSPORINE  modified  100 mg Oral BID  . insulin aspart  0-5 Units Subcutaneous QHS  . insulin aspart  0-9 Units Subcutaneous TID WC  . isosorbide mononitrate  60 mg Oral Daily  . labetalol  300 mg Oral BID  . levothyroxine  75 mcg Oral QAC breakfast  . metoCLOPramide  5 mg Oral TID PC & HS  . predniSONE  5 mg Oral Q breakfast  . rOPINIRole  1 mg Oral QHS  . sodium chloride  3 mL Intravenous Q12H   Continuous Infusions:    Assessment/Plan:  Anemia: Appears to be acute on chronic and worsening, could be due to chronic kidney disease  - Hemoglobin appropriately improved to 8.5 after 2 units of packed RBCs, per patient, he had EGD and colonoscopy 'couple of years ago' by [facility] and was essentially normal  - Occult stool test pending, if positive will discuss with GI. Since hemoglobin stable, dc serial H&H, continue PPI, also received Procrit  -Hold aspirin   .CKD (chronic kidney disease)/end-stage renal disease status post renal transplant: Creatinine 4.4  - Appreciate renal service following, noted recommendations   .Obstructive sleep apnea  - Continue CPAP, patient does not remember his home settings   .Dyspnea: Possibly secondary to acute on chronic anemia, ? Viral pneumonitis, CHF   .CHF (congestive heart failure): Appears to be compensated  - Cardiac enzymes negative, echo pending, hold off on Lasix   .Fevers/nonproductive cough: Influenza negative, afebrile overnight - Discussed with Dr. Karolee Ohs (ID) on the phone, recommended to place him on IV Zithromax and IV aztreonam he develops fevers or SIRS symptoms   .Diabetes mellitus: Uncontrolled with hypoglycemia episode, patient was still using his insulin pump despite instructions to stop it during hospitalization. Patient will continue only on sliding scale insulin, will add basal insulin if needed.   DVT prophylaxis: No heparin or Lovenox given the significant anemia. Place SCD on the right lower extremity.   CODE STATUS: Full  code   Disposition: Not medically ready   LOS: 1 day   Cela Newcom M.D. [facility] 07/23/2011, 11:33 AM Pager: [phone]

## 2011-07-23 NOTE — Progress Notes (Signed)
Pt refused to wear CPAP for the night.  Pt encouraged to notify RN/ RT of any concerns.  

## 2011-07-24 LAB — BASIC METABOLIC PANEL
BUN: 118 mg/dL — ABNORMAL HIGH (ref 6–23)
CO2: 22 mEq/L (ref 19–32)
Calcium: 9.7 mg/dL (ref 8.4–10.5)
Chloride: 94 mEq/L — ABNORMAL LOW (ref 96–112)
Creatinine, Ser: 4.24 mg/dL — ABNORMAL HIGH (ref 0.50–1.35)
GFR calc Af Amer: 18 mL/min — ABNORMAL LOW (ref >90)
GFR calc non Af Amer: 16 mL/min — ABNORMAL LOW (ref >90)
Glucose, Bld: 385 mg/dL — ABNORMAL HIGH (ref 70–99)
Potassium: 4.8 mEq/L (ref 3.5–5.1)
Sodium: 130 mEq/L — ABNORMAL LOW (ref 135–145)

## 2011-07-24 LAB — TYPE AND SCREEN
ABO/RH(D): A POS
Antibody Screen: NEGATIVE
Unit division: 0
Unit division: 0

## 2011-07-24 LAB — GLUCOSE, CAPILLARY
Glucose-Capillary: 430 mg/dL — ABNORMAL HIGH (ref 70–99)
Glucose-Capillary: 362 mg/dL — ABNORMAL HIGH (ref 70–99)
Glucose-Capillary: 256 mg/dL — ABNORMAL HIGH (ref 70–99)
Glucose-Capillary: 128 mg/dL — ABNORMAL HIGH (ref 70–99)
Glucose-Capillary: 71 mg/dL (ref 70–99)

## 2011-07-24 LAB — CLOSTRIDIUM DIFFICILE BY PCR: Toxigenic C. Difficile by PCR: NEGATIVE

## 2011-07-24 LAB — LEGIONELLA ANTIGEN, URINE: Legionella Antigen, Urine: NEGATIVE

## 2011-07-24 LAB — HEMOGLOBIN A1C
Hgb A1c MFr Bld: 6.7 % — ABNORMAL HIGH (ref <5.7)
Mean Plasma Glucose: 146 mg/dL — ABNORMAL HIGH (ref <117)

## 2011-07-24 LAB — OCCULT BLOOD X 1 CARD TO LAB, STOOL: Fecal Occult Bld: NEGATIVE

## 2011-07-24 LAB — MRSA PCR SCREENING: MRSA by PCR: NEGATIVE

## 2011-07-24 MED ORDER — GUAIFENESIN-DM 100-10 MG/5ML PO SYRP
5.0000 mL | ORAL_SOLUTION | ORAL | Status: DC | PRN
  Administered 2011-08-01: 5 mL via ORAL
  Filled 2011-07-24: qty 5

## 2011-07-24 MED ORDER — BENZONATATE 100 MG PO CAPS
100.0000 mg | ORAL_CAPSULE | Freq: Three times a day (TID) | ORAL | Status: DC
  Administered 2011-07-24 – 2011-08-13 (×56): 100 mg via ORAL
  Filled 2011-07-24 (×63): qty 1

## 2011-07-24 MED ORDER — INSULIN ASPART 100 UNIT/ML ~~LOC~~ SOLN
0.0000 [IU] | Freq: Three times a day (TID) | SUBCUTANEOUS | Status: DC
  Administered 2011-07-24: 11 [IU] via SUBCUTANEOUS
  Administered 2011-07-24: 3 [IU] via SUBCUTANEOUS
  Administered 2011-07-25 (×2): 7 [IU] via SUBCUTANEOUS
  Administered 2011-07-25: 11 [IU] via SUBCUTANEOUS
  Administered 2011-07-26: 7 [IU] via SUBCUTANEOUS
  Administered 2011-07-26: 11 [IU] via SUBCUTANEOUS

## 2011-07-24 MED ORDER — MENTHOL 3 MG MT LOZG
1.0000 | LOZENGE | OROMUCOSAL | Status: DC | PRN
  Filled 2011-07-24: qty 9

## 2011-07-24 MED ORDER — INSULIN ASPART 100 UNIT/ML ~~LOC~~ SOLN
6.0000 [IU] | Freq: Once | SUBCUTANEOUS | Status: AC
  Administered 2011-07-24: 6 [IU] via SUBCUTANEOUS

## 2011-07-24 MED ORDER — INSULIN ASPART 100 UNIT/ML ~~LOC~~ SOLN
3.0000 [IU] | Freq: Three times a day (TID) | SUBCUTANEOUS | Status: DC
  Administered 2011-07-24 – 2011-07-26 (×7): 3 [IU] via SUBCUTANEOUS

## 2011-07-24 MED ORDER — PANTOPRAZOLE SODIUM 40 MG PO TBEC
40.0000 mg | DELAYED_RELEASE_TABLET | Freq: Every day | ORAL | Status: DC
  Administered 2011-07-24 – 2011-08-12 (×19): 40 mg via ORAL
  Filled 2011-07-24 (×18): qty 1

## 2011-07-24 NOTE — Progress Notes (Signed)
Subjective:  Looks like he does not feel good.  Says his output is increasing but does not feel great  Objective Vital signs in last 24 hours: Filed Vitals:   07/23/11 1705 07/23/11 2158 07/24/11 0526 07/24/11 1000  BP: 152/69 154/76 159/78 142/70  Pulse: 85 87 88 86  Temp: 98.1 F (36.7 C) 98.1 F (36.7 C) 98.5 F (36.9 C) 98.6 F (37 C)  TempSrc: Oral Oral Oral Oral  Resp: 20 20 20 18   Height:      Weight:  122.5 kg (270 lb 1 oz)    SpO2: 97% 95% 93% 95%   Weight change: -1.785 kg (-3 lb 15 oz)  Intake/Output Summary (Last 24 hours) at 07/24/11 1136 Last data filed at 07/24/11 1115  Gross per 24 hour  Intake    603 ml  Output    825 ml  Net   -222 ml   Labs: Basic Metabolic Panel:  Lab [id number] 07/23/11 0848 07/23/11 0700  NA 130* 132* 134*  K 4.8 4.7 4.3  CL 94* 95* 97  CO2 22 23 23   GLUCOSE 385* 71 58*  BUN 118* 111* 113*  CREATININE 4.24* 4.43* 4.60*  CALCIUM 9.7 9.4 9.6  ALB -- -- --  PHOS -- -- --   Liver Function Tests:  Lab 07/22/11 1457  AST 51*  ALT 57*  ALKPHOS 549*  BILITOT 0.6  PROT 7.4  ALBUMIN 1.4*   No results found for this basename: LIPASE:3,AMYLASE:3 in the last 168 hours No results found for this basename: AMMONIA:3 in the last 168 hours CBC:  Lab 07/23/11 0848 07/23/11 0700 07/22/11 1942 07/22/11 1457  WBC 11.1* 11.4* -- 10.4  NEUTROABS -- -- -- 8.5*  HGB 8.3* 8.5* 7.0* --  HCT 27.2* 26.6* 23.1* --  MCV 86.3 86.4 -- 88.2  PLT 461* 465* -- 500*   Cardiac Enzymes:  Lab 07/23/11 0500 07/22/11 1942 07/22/11 1519  CKTOTAL 77 52 --  CKMB 1.5 1.4 --  CKMBINDEX -- -- --  TROPONINI <0.30 <0.30 <0.30   CBG:  Lab 07/24/11 0818 07/24/11 0315 07/23/11 2003 07/23/11 1629 07/23/11 1137  GLUCAP 362* 430* 326* 297* 185*    Iron Studies: No results found for this basename: IRON,TIBC,TRANSFERRIN,FERRITIN in the last 72 hours Studies/Results: Dg Chest 2 View  07/22/2011  *RADIOLOGY REPORT*  Clinical Data: Fever for 1 week with  cough, shortness of breath, hypertension and diabetes  CHEST - 2 VIEW  Comparison: 10/19/2004  Findings: Heart and mediastinal contours are stable with an unchanged degree of mild cardiomegaly.  The lung fields are notable for mild pulmonary vascular congestion.  No overt congestive failure, interstitial septal lines or significant peribronchial cuffing is seen to suggest early interstitial edema. A slight increase in interstitial markings are seen diffusely and may indicate underlying viral pneumonitis.  No focal infiltrates are seen to suggest an area of focal bronchopneumonia.  No pleural fluid is seen.  Bony structures appear intact.  IMPRESSION: Mild pulmonary vascular congestion and mild increase in interstitial markings suspicious for viral pneumonitis given history of fever.  No findings suggestive of bronchopneumonia.  Original Report Authenticated By: Ander Gaster, M.D.   Medications: Infusions:    Scheduled Medications:    . benzonatate  100 mg Oral TID  . cycloSPORINE modified  100 mg Oral BID  . insulin aspart  0-20 Units Subcutaneous TID WC  . insulin aspart  0-5 Units Subcutaneous QHS  . insulin aspart  3 Units Subcutaneous TID WC  .  insulin aspart  6 Units Subcutaneous Once  . insulin glargine  20 Units Subcutaneous QHS  . isosorbide mononitrate  60 mg Oral Daily  . labetalol  300 mg Oral BID  . levothyroxine  75 mcg Oral QAC breakfast  . metoCLOPramide  5 mg Oral TID PC & HS  . pantoprazole  40 mg Oral Q1200  . predniSONE  5 mg Oral Q breakfast  . rOPINIRole  1 mg Oral QHS  . sodium chloride  3 mL Intravenous Q12H  . DISCONTD: insulin aspart  0-9 Units Subcutaneous TID WC    have reviewed scheduled and prn medications.  Physical Exam: General: looks as if he does not feel well Heart: RRR Lungs: poor effort, mostly clear Abdomen: obese, soft Extremities: no peripheral edema Dialysis Access: none.  Has old AVG on Left arm   I Assessment/ Plan: Pt is a 46  y.o. yo male who was admitted on 07/22/2011 with  S/p renal transplant which appears to be failing.  Now with failure to thrive likely multifactorial anemia/uremia/infectious etiology  Assessment/Plan: 1. S/p renal transplant with chronic allograft nephropathy and now some acute on chronic renal failure.  Not sure if hemodynamically mediated, ATN due to recent diuresis during hosp for CHF.  Holding diuretics, is making urine but numbers not getting better quickly.  No absolute indication for dialysis but am concerned about this failure to thrive sort of picture with now 2 hosps back to back and patient agrees.  He is agreeable for [place] to get [facility]VS to see for options regarding access and if fails to improve will agree to initiation of HD this hosp via [facility] (appparently has difficulties with venous stenosis in UEs) Will call them in am.  2. Anemia- did not see result of stool cards.  Patient received procrit 40, 000 units Friday as OP, also iron and is s/p transfusion last night.  Will continue to monitor 3. ID- not sure if has infection going on.  Supportive care with no antibiotics unless obvious indication.  In isolation in case of flu/virus. 4. S/p renal transplant- continue home doses of cyclosporine and prednisone.  5. Volume- would continue to hold lasix and follow.   Shakura Cowing A   07/24/2011,11:36 AM  LOS: 2 days

## 2011-07-24 NOTE — Progress Notes (Signed)
Patient refused cpap tonight. RT will continue to monitor. 

## 2011-07-24 NOTE — Progress Notes (Signed)
Patient ID: Dale [name]  male  [id number]    DOB: 02/05/1966    DOA: 07/22/2011  PCP: No primary provider on file.  Subjective: More alert and awake and feels better today however blood sugars out of control. Coughing  Objective: Weight change: -1.785 kg (-3 lb 15 oz)  Intake/Output Summary (Last 24 hours) at 07/24/11 1131 Last data filed at 07/24/11 1115  Gross per 24 hour  Intake    603 ml  Output    825 ml  Net   -222 ml   Blood pressure 142/70, pulse 86, temperature 98.6 F (37 C), temperature source Oral, resp. rate 18, height 5\' 8"  (1.727 m), weight 122.5 kg (270 lb 1 oz), SpO2 95.00%.  Physical Exam: General: Alert and awake, oriented x3, not in any acute distress. HEENT: anicteric sclera, pupils reactive to light and accommodation, EOMI CVS: S1-S2 clear, no murmur rubs or gallops Chest: clear to auscultation bilaterally, no wheezing, rales or rhonchi Abdomen: soft nontender, nondistended, normal bowel sounds, no organomegaly Extremities: Left below-knee amputation  Neuro: Cranial nerves II-XII intact, no focal neurological deficits  Lab Results: Basic Metabolic Panel:  Lab [id number] 07/23/11 0848  NA 130* 132*  K 4.8 4.7  CL 94* 95*  CO2 22 23  GLUCOSE 385* 71  BUN 118* 111*  CREATININE 4.24* 4.43*  CALCIUM 9.7 9.4  MG -- --  PHOS -- --   Liver Function Tests:  Lab 07/22/11 1457  AST 51*  ALT 57*  ALKPHOS 549*  BILITOT 0.6  PROT 7.4  ALBUMIN 1.4*   CBC:  Lab 07/23/11 0848 07/23/11 0700 07/22/11 1457  WBC 11.1* 11.4* --  NEUTROABS -- -- 8.5*  HGB 8.3* 8.5* --  HCT 27.2* 26.6* --  MCV 86.3 86.4 --  PLT 461* 465* --   Cardiac Enzymes:  Lab 07/23/11 0500 07/22/11 1942 07/22/11 1519  CKTOTAL 77 52 --  CKMB 1.5 1.4 --  CKMBINDEX -- -- --  TROPONINI <0.30 <0.30 <0.30   BNP: No components found with this basename: POCBNP:2 CBG:  Lab 07/24/11 0818 07/24/11 0315 07/23/11 2003 07/23/11 1629 07/23/11 1137  GLUCAP 362* 430* 326* 297* 185*      Micro Results: Recent Results (from the past 240 hour(s))  MRSA PCR SCREENING     Status: Normal   Collection Time   07/24/11  5:30 AM      Component Value Range Status Comment   MRSA by PCR NEGATIVE  NEGATIVE  Final     Studies/Results: Dg Chest 2 View  07/22/2011  *RADIOLOGY REPORT*  Clinical Data: Fever for 1 week with cough, shortness of breath, hypertension and diabetes  CHEST - 2 VIEW  Comparison: 10/19/2004  Findings: Heart and mediastinal contours are stable with an unchanged degree of mild cardiomegaly.  The lung fields are notable for mild pulmonary vascular congestion.  No overt congestive failure, interstitial septal lines or significant peribronchial cuffing is seen to suggest early interstitial edema. A slight increase in interstitial markings are seen diffusely and may indicate underlying viral pneumonitis.  No focal infiltrates are seen to suggest an area of focal bronchopneumonia.  No pleural fluid is seen.  Bony structures appear intact.  IMPRESSION: Mild pulmonary vascular congestion and mild increase in interstitial markings suspicious for viral pneumonitis given history of fever.  No findings suggestive of bronchopneumonia.  Original Report Authenticated By: Ander Gaster, M.D.   US Abdomen Limited  07/14/2011  *RADIOLOGY REPORT*  Clinical Data:  Elevated liver function tests, history  of renal transplantation  LIMITED ABDOMINAL ULTRASOUND - RIGHT UPPER QUADRANT  Comparison:  CT abdomen of 08/26/2004  Findings:  Gallbladder:  The gallbladder is visualized and no gallstones are noted.  There is no pain over the gallbladder with compression.  Common bile duct:  The common bile duct is normal measuring 5.0 mm in diameter.  Liver:  The liver is echogenic consistent with fatty infiltration. Also the liver measures 20.1 cm sagittally indicating hepatomegaly. No ductal dilatation is seen.  IMPRESSION:  1.  Fatty infiltration of the liver.  Hepatomegaly. 2.  No gallstones.                    Original Report Authenticated By: Joretta Bachelor, M.D.    Medications: Scheduled Meds:    . benzonatate  100 mg Oral TID  . cycloSPORINE modified  100 mg Oral BID  . insulin aspart  0-20 Units Subcutaneous TID WC  . insulin aspart  0-5 Units Subcutaneous QHS  . insulin aspart  3 Units Subcutaneous TID WC  . insulin aspart  6 Units Subcutaneous Once  . insulin glargine  20 Units Subcutaneous QHS  . isosorbide mononitrate  60 mg Oral Daily  . labetalol  300 mg Oral BID  . levothyroxine  75 mcg Oral QAC breakfast  . metoCLOPramide  5 mg Oral TID PC & HS  . pantoprazole  40 mg Oral Q1200  . predniSONE  5 mg Oral Q breakfast  . rOPINIRole  1 mg Oral QHS  . sodium chloride  3 mL Intravenous Q12H  . DISCONTD: insulin aspart  0-9 Units Subcutaneous TID WC   Continuous Infusions:    Assessment/Plan:  Anemia: Appears to be acute on chronic and worsening, could be due to chronic kidney disease  - Hemoglobin appropriately improved to 8.3 after 2 units of packed RBCs and Procrit.  - Occult stool test pending, if positive will discuss with GI. (per patient, he had EGD and colonoscopy 'couple of years ago' by [facility] and was essentially normal) -Hold aspirin   .CKD (chronic kidney disease)/end-stage renal disease status post renal transplant: Creatinine 4.2  - Appreciate renal service following, noted recommendations. Creatinine has not improved significantly during admission, question if he needs hemodialysis, defer to nephrology.    .Obstructive sleep apnea  - Continue CPAP, patient does not remember his home settings   .Dyspnea: Possibly secondary to acute on chronic anemia, ? Viral pneumonitis, CHF   .CHF (congestive heart failure): Appears to be compensated  - Cardiac enzymes negative, echo pending, hold off on Lasix   .Fevers/nonproductive cough: Influenza negative, afebrile overnight - I had discussed with Dr. Karolee Ohs (ID) on the phone at the time of  admission, recommended to place him on IV Zithromax and IV aztreonam he develops fevers or SIRS symptoms   .Diabetes mellitus: Uncontrolled  - Continue Lantus, added meal coverage, placed on resistant sliding scale - Placed diabetic coordinator consult for insulin pump, patient feels more comfortable with the insulin pump use  DVT prophylaxis: No heparin or Lovenox given the significant anemia. Place SCD on the right lower extremity.   CODE STATUS: Full code   Disposition: Not medically ready   LOS: 2 days   Wauneta Silveria M.D. [facility] 07/24/2011, 11:31 AM Pager: [phone]

## 2011-07-25 DIAGNOSIS — N186 End stage renal disease: Secondary | ICD-10-CM

## 2011-07-25 LAB — BASIC METABOLIC PANEL
BUN: 124 mg/dL — ABNORMAL HIGH (ref 6–23)
CO2: 22 mEq/L (ref 19–32)
Calcium: 9.2 mg/dL (ref 8.4–10.5)
Chloride: 94 mEq/L — ABNORMAL LOW (ref 96–112)
Creatinine, Ser: 4.13 mg/dL — ABNORMAL HIGH (ref 0.50–1.35)
GFR calc Af Amer: 19 mL/min — ABNORMAL LOW (ref >90)
GFR calc non Af Amer: 16 mL/min — ABNORMAL LOW (ref >90)
Glucose, Bld: 221 mg/dL — ABNORMAL HIGH (ref 70–99)
Potassium: 4.9 mEq/L (ref 3.5–5.1)
Sodium: 130 mEq/L — ABNORMAL LOW (ref 135–145)

## 2011-07-25 LAB — GLUCOSE, CAPILLARY
Glucose-Capillary: 295 mg/dL — ABNORMAL HIGH (ref 70–99)
Glucose-Capillary: 245 mg/dL — ABNORMAL HIGH (ref 70–99)
Glucose-Capillary: 216 mg/dL — ABNORMAL HIGH (ref 70–99)
Glucose-Capillary: 208 mg/dL — ABNORMAL HIGH (ref 70–99)

## 2011-07-25 LAB — POCT HEMOGLOBIN-HEMACUE: Hemoglobin: 6.7 g/dL — CL (ref 13.0–17.0)

## 2011-07-25 NOTE — Progress Notes (Signed)
Inpatient Diabetes Program Recommendations  AACE/ADA: New Consensus Statement on Inpatient Glycemic Control (2009)  Target Ranges:  Prepandial:   less than 140 mg/dL      Peak postprandial:   less than 180 mg/dL (1-2 hours)      Critically ill patients:  140 - 180 mg/dL   Reason for Visit: Hyperglycemia sustained in 200's  Results for Dale [name], Dale [name] (MRN [id number]) as of 07/25/2011 18:55  Ref. Range 07/24/2011 16:52 07/24/2011 21:46 07/25/2011 07:38 07/25/2011 12:04 07/25/2011 17:08  Glucose-Capillary Latest Range: 70-99 mg/dL 128 (H) 71 295 (H) 245 (H) 216 (H)    Pt's basal needs appear to be higher than what patient is getting with 20 units Lantus.  Please increase per below. Pt is eating 100%, would benefit from more meal coverage (per below) Inpatient Diabetes Program Recommendations Insulin - Basal: Increase Lantus slightly by 5 units at this point. Correction (SSI): Decrease to sensitive correction tidwc. Insulin - Meal Coverage: Please increase to 6 units tidwc. (to prevent large amount of correction needed to lower/correct glucose.  Note: Noted patient's usual insulin therapy is per insulin pump.  Due to anemia and potential for blood transfusions, agree that insulin pump therapy is not the best option at this point.  However, please consult the Inpatient Diabetes Program when patient is ready to return to insulin pump therapy to assist.  Thank you, Rosita Kea, RN, CNS, Diabetes Coordinator [phone])

## 2011-07-25 NOTE — Progress Notes (Signed)
  [facility] Progress Note    Pt is a 46 y.o. yo male who was admitted on 07/22/2011 with S/p renal transplant which appears to be failing. Now with failure to thrive likely multifactorial anemia/uremia/infectious etiology  Assessment/Plan:  1. S/p renal transplant with chronic allograft nephropathy and now some acute on chronic renal failure with prerenal azotemia. Recent hosp for CHF. Holding diuretics, is making urine. No absolute indication for dialysis but am concerned about this failure to thrive sort of picture with now 2 hosps back to back and patient agrees. He is agreeable for [place] to get [facility] to see for options regarding access. He may need a HeRO access. 2. Anemia- negative stool cards. Patient received procrit 40, 000 units Friday as OP, also iron and is s/p transfusion. Will continue to monitor   3. S/p renal transplant- continue home doses of cyclosporine and prednisone.  4. Volume- would continue to hold lasix and follow.  Subjective:   No real change in symptons   Objective:   BP 135/68  Pulse 89  Temp(Src) 98.1 F (36.7 C) (Oral)  Resp 20  Ht 5\' 8"  (1.727 m)  Wt 122.5 kg (270 lb 1 oz)  BMI 41.06 kg/m2  SpO2 100%  Intake/Output Summary (Last 24 hours) at 07/25/11 0926 Last data filed at 07/25/11 0550  Gross per 24 hour  Intake    720 ml  Output    852 ml  Net   -132 ml   Weight change:   Physical Exam: [id number] CVS:RRR Resp:clear [id number] Ext:LLE amputation. Failed AV accesses  Imaging: No results found.  Labs: BMET  Lab 07/25/11 0450 07/24/11 0647 07/23/11 0848 07/23/11 0700 07/22/11 1457  NA 130* 130* 132* 134* 131*  K 4.9 4.8 4.7 4.3 4.4  CL 94* 94* 95* 97 97  CO2 22 22 23 23 21   GLUCOSE 221* 385* 71 58* 104*  BUN 124* 118* 111* 113* 111*  CREATININE 4.13* 4.24* 4.43* 4.60* 4.41*  ALB -- -- -- -- --  CALCIUM 9.2 9.7 9.4 9.6 9.3  PHOS -- -- -- -- --   CBC  Lab 07/23/11 0848 07/23/11 0700 07/22/11 1942 07/22/11 1457  WBC 11.1*  11.4* -- 10.4  NEUTROABS -- -- -- 8.5*  HGB 8.3* 8.5* 7.0* 6.8*  HCT 27.2* 26.6* 23.1* 22.5*  MCV 86.3 86.4 -- 88.2  PLT 461* 465* -- 500*    Medications:      . benzonatate  100 mg Oral TID  . cycloSPORINE modified  100 mg Oral BID  . insulin aspart  0-20 Units Subcutaneous TID WC  . insulin aspart  0-5 Units Subcutaneous QHS  . insulin aspart  3 Units Subcutaneous TID WC  . insulin glargine  20 Units Subcutaneous QHS  . isosorbide mononitrate  60 mg Oral Daily  . labetalol  300 mg Oral BID  . levothyroxine  75 mcg Oral QAC breakfast  . metoCLOPramide  5 mg Oral TID PC & HS  . pantoprazole  40 mg Oral Q1200  . predniSONE  5 mg Oral Q breakfast  . rOPINIRole  1 mg Oral QHS  . sodium chloride  3 mL Intravenous Q12H  . DISCONTD: insulin aspart  0-9 Units Subcutaneous TID WC

## 2011-07-25 NOTE — Progress Notes (Signed)
Patient ID: Dale [name]  male  [id number]    DOB: 08/30/1965    DOA: 07/22/2011  PCP: No primary provider on file.  Subjective: Feels somewhat better today, no specific complaints  Objective: Weight change:   Intake/Output Summary (Last 24 hours) at 07/25/11 1118 Last data filed at 07/25/11 0550  Gross per 24 hour  Intake    720 ml  Output    302 ml  Net    418 ml   Blood pressure 148/76, pulse 87, temperature 98.1 F (36.7 C), temperature source Oral, resp. rate 19, height 5\' 8"  (1.727 m), weight 122.5 kg (270 lb 1 oz), SpO2 96.00%.  Physical Exam: General: Alert and awake, oriented x3, not in any acute distress. HEENT: anicteric sclera, pupils reactive to light and accommodation, EOMI CVS: S1-S2 clear, no murmur rubs or gallops Chest: clear to auscultation bilaterally, no wheezing, rales or rhonchi Abdomen: soft nontender, nondistended, normal bowel sounds, no organomegaly Extremities: Left below-knee amputation  Neuro: Cranial nerves II-XII intact, no focal neurological deficits  Lab Results: Basic Metabolic Panel:  Lab [id number] 0450 07/24/11 0647  NA 130* 130*  K 4.9 4.8  CL 94* 94*  CO2 22 22  GLUCOSE 221* 385*  BUN 124* 118*  CREATININE 4.13* 4.24*  CALCIUM 9.2 9.7  MG -- --  PHOS -- --   Liver Function Tests:  Lab 07/22/11 1457  AST 51*  ALT 57*  ALKPHOS 549*  BILITOT 0.6  PROT 7.4  ALBUMIN 1.4*   CBC:  Lab 07/23/11 0848 07/23/11 0700 07/22/11 1457  WBC 11.1* 11.4* --  NEUTROABS -- -- 8.5*  HGB 8.3* 8.5* --  HCT 27.2* 26.6* --  MCV 86.3 86.4 --  PLT 461* 465* --   Cardiac Enzymes:  Lab 07/23/11 0500 07/22/11 1942 07/22/11 1519  CKTOTAL 77 52 --  CKMB 1.5 1.4 --  CKMBINDEX -- -- --  TROPONINI <0.30 <0.30 <0.30   BNP: No components found with this basename: POCBNP:2 CBG:  Lab 07/25/11 0738 07/24/11 2146 07/24/11 1652 07/24/11 1132 07/24/11 0818  GLUCAP 295* 71 128* 256* 362*     Micro Results: Recent Results (from the past 240  hour(s))  MRSA PCR SCREENING     Status: Normal   Collection Time   07/24/11  5:30 AM      Component Value Range Status Comment   MRSA by PCR NEGATIVE  NEGATIVE  Final   CLOSTRIDIUM DIFFICILE BY PCR     Status: Normal   Collection Time   07/24/11  2:55 PM      Component Value Range Status Comment   C difficile by pcr NEGATIVE  NEGATIVE  Final     Studies/Results: Dg Chest 2 View  07/22/2011  *RADIOLOGY REPORT*  Clinical Data: Fever for 1 week with cough, shortness of breath, hypertension and diabetes  CHEST - 2 VIEW  Comparison: 10/19/2004  Findings: Heart and mediastinal contours are stable with an unchanged degree of mild cardiomegaly.  The lung fields are notable for mild pulmonary vascular congestion.  No overt congestive failure, interstitial septal lines or significant peribronchial cuffing is seen to suggest early interstitial edema. A slight increase in interstitial markings are seen diffusely and may indicate underlying viral pneumonitis.  No focal infiltrates are seen to suggest an area of focal bronchopneumonia.  No pleural fluid is seen.  Bony structures appear intact.  IMPRESSION: Mild pulmonary vascular congestion and mild increase in interstitial markings suspicious for viral pneumonitis given history of fever.  No findings  suggestive of bronchopneumonia.  Original Report Authenticated By: Ander Gaster, M.D.   US Abdomen Limited  07/14/2011  *RADIOLOGY REPORT*  Clinical Data:  Elevated liver function tests, history of renal transplantation  LIMITED ABDOMINAL ULTRASOUND - RIGHT UPPER QUADRANT  Comparison:  CT abdomen of 08/26/2004  Findings:  Gallbladder:  The gallbladder is visualized and no gallstones are noted.  There is no pain over the gallbladder with compression.  Common bile duct:  The common bile duct is normal measuring 5.0 mm in diameter.  Liver:  The liver is echogenic consistent with fatty infiltration. Also the liver measures 20.1 cm sagittally indicating  hepatomegaly. No ductal dilatation is seen.  IMPRESSION:  1.  Fatty infiltration of the liver.  Hepatomegaly. 2.  No gallstones.                   Original Report Authenticated By: Joretta Bachelor, M.D.    Medications: Scheduled Meds:    . benzonatate  100 mg Oral TID  . cycloSPORINE modified  100 mg Oral BID  . insulin aspart  0-20 Units Subcutaneous TID WC  . insulin aspart  0-5 Units Subcutaneous QHS  . insulin aspart  3 Units Subcutaneous TID WC  . insulin glargine  20 Units Subcutaneous QHS  . isosorbide mononitrate  60 mg Oral Daily  . labetalol  300 mg Oral BID  . levothyroxine  75 mcg Oral QAC breakfast  . metoCLOPramide  5 mg Oral TID PC & HS  . pantoprazole  40 mg Oral Q1200  . predniSONE  5 mg Oral Q breakfast  . rOPINIRole  1 mg Oral QHS  . sodium chloride  3 mL Intravenous Q12H   Continuous Infusions:    Assessment/Plan:  Anemia: Appears to be acute on chronic and worsening, could be due to chronic kidney disease  - Hemoglobin appropriately improved to 8.3 after 2 units of packed RBCs and Procrit.  - Occult stool test negative.  - Hold aspirin for now  .CKD (chronic kidney disease)/end-stage renal disease status post renal transplant: Creatinine 4.2  - Appreciate renal service following, noted recommendations. Creatinine has not improved significantly during admission, per renal, vascular surgery to see for options regarding access.     .Obstructive sleep apnea  - Patient refuses to wear CPAP   .Dyspnea: Improved, possibly secondary to acute on chronic anemia  .CHF (congestive heart failure): Appears to be compensated  - Cardiac enzymes negative, 2-D echo on 07/23/2011 done, EF 55-60% with no regional wall motion abnormalities, hold off on Lasix   .Fevers/nonproductive cough: Influenza negative, continues to remain afebrile - I had discussed with Dr. Karolee Ohs (ID) on the phone at the time of admission, recommended to place him on IV Zithromax and IV  aztreonam he develops fevers or SIRS symptoms. Keep off antibiotics for now.   .Diabetes mellitus: Uncontrolled,  brittle - Continue Lantus, decreased meal coverage, on resistant sliding scale - Placed diabetic coordinator consult for insulin pump, patient feels more comfortable with the insulin pump use  DVT prophylaxis: No heparin or Lovenox given the significant anemia. Place SCD on the right lower extremity.   CODE STATUS: Full code   Disposition: Not medically ready, awaiting renal workup   LOS: 3 days   Omran Keelin M.D. [facility] 07/25/2011, 11:18 AM Pager: [phone]

## 2011-07-25 NOTE — Consult Note (Signed)
[facility]in Specialist [facility]  Vascular Consult Note    Patient name: Dale [name] MRN: [id number] DOB: 06-19-1965 Sex: male   Referred by: Florene Glen  Reason for referral: Hemodialysis access  HISTORY OF PRESENT ILLNESS: The patient is a 46 year old gentleman with a prior kidney transplant for kidney failure. This is beginning to have poor function with rejection we were consulted for potential access planning. [name] has a complex access history. He had a left wrist Cimino fistula which never matured. He then had a left forearm loop graft which worked for a period of time but then he developed marked swelling due to central venous occlusion. He never had a right arm access. He is not specifically recall but deficit in that there was stenosis in the right side veins as well. He subsequently had a right femoral loop graft function up until the time of his hemodialysis. After he been initiated on hemodialysis he did have an infection of his right femoral loop requiring removal of the graft. He does have a left below-knee amputation that resulted from ankle fracture an infection in his ankle. He never had a left femoral graft or bypass. He is morbidly obese.  Past Medical History  Diagnosis Date  . Renal disease   . Diabetes mellitus     Past Surgical History  Procedure Date  . Appendectomy   . Hernia repair   . Kidney transplant   . Cataract extraction   . Arteriovenous graft placement   . Av fistula placement   . Toe amputation   . Below knee leg amputation   . Carpal tunnel release   . Shoulder surgery     History   Social History  . Marital Status: Married    Spouse Name: N/A    Number of Children: N/A  . Years of Education: N/A   Occupational History  . Not on file.   Social History Main Topics  . Smoking status: Never Smoker   . Smokeless tobacco: Not on file  . Alcohol Use: Yes  . Drug Use: No  . Sexually Active:    Other Topics Concern  . Not on  file   Social History Narrative  . No narrative on file    No family history on file.  Allergies  Allergen Reactions  . Amoxicillin   . Ciprofloxacin   . Clindamycin/Lincomycin   . Codeine   . Levaquin   . Vasotec Cough    Prior to Admission medications   Medication Sig Start Date End Date Taking? Authorizing Provider  allopurinol (ZYLOPRIM) 300 MG tablet Take 300 mg by mouth Daily. 06/26/11  Yes Historical Provider, MD  ANDROGEL PUMP 20.25 MG/ACT (1.62%) GEL Apply 4 application topically Daily. 05/26/11  Yes Historical Provider, MD  aspirin 81 MG tablet Take 81 mg by mouth daily.   Yes Historical Provider, MD  cycloSPORINE modified (NEORAL/GENGRAF) 100 MG capsule Take 100 mg by mouth 2 (two) times daily.  06/26/11  Yes Historical Provider, MD  furosemide (LASIX) 80 MG tablet Take 80 mg by mouth Twice daily. 07/14/11  Yes Historical Provider, MD  Hydrocodone-Acetaminophen 5-300 MG TABS Take 1 tablet by mouth Every 6 hours as needed. For pain 07/14/11  Yes Historical Provider, MD  isosorbide mononitrate (IMDUR) 60 MG 24 hr tablet Take 60 mg by mouth Daily. 07/01/11  Yes Historical Provider, MD  labetalol (NORMODYNE) 300 MG tablet Take 300 mg by mouth Twice daily. 05/26/11  Yes Historical Provider, MD  levothyroxine (SYNTHROID, [place])  75 MCG tablet Take 75 mg by mouth Daily. 06/26/11  Yes Historical Provider, MD  metoCLOPramide (REGLAN) 5 MG tablet Take 5 mg by mouth 4 (four) times daily - after meals and at bedtime. 05/26/11  Yes Historical Provider, MD  predniSONE (DELTASONE) 5 MG tablet Take 5 mg by mouth Daily. 06/26/11  Yes Historical Provider, MD  promethazine (PHENERGAN) 25 MG tablet Take 25 mg by mouth Every 6 hours as needed. For nausea 07/13/11  Yes Historical Provider, MD  PROVENTIL HFA 108 (90 BASE) MCG/ACT inhaler Inhale 1 puff into the lungs Every 6 hours as needed. For shortness of breath 05/17/11  Yes Historical Provider, MD  rOPINIRole (REQUIP) 1 MG tablet Take 1 mg by mouth Every  evening. Take at 2000 07/18/11  Yes Historical Provider, MD  NOVOLOG 100 UNIT/ML injection Inject into the skin Continuous. 05/09/11   Historical Provider, MD     REVIEW OF SYSTEMS: Listed in his history and physical and reviewed  PHYSICAL EXAMINATION:  Filed Vitals:   07/25/11 1400  BP: 136/81  Pulse: 85  Temp: 98.9 F (37.2 C)  Resp: 18    General: The patient appears their stated age. Pulmonary: There is a good air exchange bilaterally  Abdomen: Soft and non-tender, obese Musculoskeletal: There are no major deformities.  There is no significant extremity pain. Left below-knee amputation well healed Neurologic: No focal weakness or paresthesias are detected, Skin: There are no ulcer or rashes noted. He does have darkening of the right ankle Psychiatric: The patient has normal affect. Cardiovascular: There is a regular rate and rhythm without significant murmur appreciated. Pulse status: 2+ radial pulses bilaterally 2+ right popliteal pulse, diminished left femoral pulse   Outside Studies/Documentation I reviewed his old dating back to 2004 in the PACS system. There is no prior documentation of upper extremity venogram.  Medication Changes: None  Assessment:  Poorly functioning renal transplant with potential need for hemodialysis access.  Plan: The patient has challenging anatomy for access. We noted that his right groin and left upper extremity are not available due to prior treatment. He may be a candidate for a Hero graft in the right arm. We will obtain upper extremity venous duplex to determine as best we can and the status of his central veins. Due to his failing transplant we would like to avoid any contrast if possible. I discussed these options with the patient who understands  Dale [name] 4/1/20135:07 PM

## 2011-07-25 NOTE — Clinical Documentation Improvement (Signed)
CHF DOCUMENTATION CLARIFICATION QUERY  THIS DOCUMENT IS NOT A PERMANENT PART OF THE MEDICAL RECORD  TO RESPOND TO THE THIS QUERY, FOLLOW THE INSTRUCTIONS BELOW:  1. If needed, update documentation for the patient's encounter via the notes activity.  2. Access this query again and click edit on the In [employer].  3. After updating, or not, click F2 to complete all highlighted (required) fields concerning your review. Select "additional documentation in the medical record" OR "no additional documentation provided".  4. Click Sign note button.  5. The deficiency will fall out of your In Basket *Please let us know if you are not able to complete this workflow by phone or e-mail (listed below).  Please update your documentation within the medical record to reflect your response to this query.                                                                                     07/25/11  Dear Dr. [facility],  In a better effort to capture your patient's severity of illness, reflect appropriate length of stay and utilization of resources, a review of the patient medical record has revealed the following indicators the diagnosis of Heart Failure.    Based on your clinical judgment, please clarify BY NOTING THE TYPE & ACUITY and document in a progress note and/or discharge summary the clinical condition associated with the following supporting information:  In responding to this query please exercise your independent judgment.  The fact that a query is asked, does not imply that any particular answer is desired or expected.  Possible Clinical Conditions? Chronic Systolic Congestive Heart Failure Chronic Diastolic Congestive Heart Failure Chronic Systolic & Diastolic Congestive Heart Failure Acute Systolic Congestive Heart Failure Acute Diastolic Congestive Heart Failure Acute Systolic & Diastolic Congestive Heart Failure Acute on Chronic Systolic Congestive Heart Failure Acute on  Chronic Diastolic Congestive Heart Failure Acute on Chronic Systolic & Diastolic  Congestive Heart Failure Other Condition________________________________________ Cannot Clinically Determine  Supporting Information:  Risk Factors:(As per notes):Pt has Acute on Chronic renal failure & hx of recent hospitalization for CHF Signs & Symptoms: (As per notes): PT HAVING SOB   ".CHF (congestive heart failure): Appears to be compensated"   - Cardiac enzymes negative, echo pending, hold off on Lasix      Reviewed:  no additional documentation provided  Thank You,  Wallie Char Delk RN, BSN  Clinical Documentation Specialist: [phone] [facility] [place]

## 2011-07-25 NOTE — Progress Notes (Signed)
   CARE MANAGEMENT NOTE 07/25/2011  Patient:  Dale [name], Dale [name]   Account Number:  [id number]  Date Initiated:  07/25/2011  Documentation initiated by:  [name]  Subjective/Objective Assessment:   PT WAS ADMITTED WITH ANEMIA     Action/Plan:   PROGRESSION OF CARE AND DISCHARGE PLANNING   Anticipated DC Date:     Anticipated DC Plan:           Choice offered to / List presented to:             Status of service:  In process, will continue to follow Medicare Important Message given?   (If response is "NO", the following Medicare IM given date fields will be blank) Date Medicare IM given:   Date Additional Medicare IM given:    Discharge Disposition:    Per UR Regulation:  Reviewed for med. necessity/level of care/duration of stay  If discussed at [name] of Stay Meetings, dates discussed:    Comments:  UR COMPLETED 07/25/11 [name], RN, BSN [phone]

## 2011-07-26 DIAGNOSIS — Z992 Dependence on renal dialysis: Secondary | ICD-10-CM

## 2011-07-26 LAB — BASIC METABOLIC PANEL
BUN: 127 mg/dL — ABNORMAL HIGH (ref 6–23)
CO2: 21 mEq/L (ref 19–32)
Calcium: 9.3 mg/dL (ref 8.4–10.5)
Chloride: 94 mEq/L — ABNORMAL LOW (ref 96–112)
Creatinine, Ser: 3.86 mg/dL — ABNORMAL HIGH (ref 0.50–1.35)
GFR calc Af Amer: 20 mL/min — ABNORMAL LOW (ref >90)
GFR calc non Af Amer: 17 mL/min — ABNORMAL LOW (ref >90)
Glucose, Bld: 272 mg/dL — ABNORMAL HIGH (ref 70–99)
Potassium: 5 mEq/L (ref 3.5–5.1)
Sodium: 129 mEq/L — ABNORMAL LOW (ref 135–145)

## 2011-07-26 LAB — FECAL LACTOFERRIN, QUANT: Fecal Lactoferrin: POSITIVE

## 2011-07-26 LAB — GLUCOSE, CAPILLARY
Glucose-Capillary: 288 mg/dL — ABNORMAL HIGH (ref 70–99)
Glucose-Capillary: 247 mg/dL — ABNORMAL HIGH (ref 70–99)
Glucose-Capillary: 181 mg/dL — ABNORMAL HIGH (ref 70–99)
Glucose-Capillary: 228 mg/dL — ABNORMAL HIGH (ref 70–99)

## 2011-07-26 LAB — CBC
HCT: 25.7 % — ABNORMAL LOW (ref 39.0–52.0)
Hemoglobin: 7.8 g/dL — ABNORMAL LOW (ref 13.0–17.0)
MCH: 26.5 pg (ref 26.0–34.0)
MCHC: 30.4 g/dL (ref 30.0–36.0)
MCV: 87.4 fL (ref 78.0–100.0)
Platelets: 466 10*3/uL — ABNORMAL HIGH (ref 150–400)
RBC: 2.94 MIL/uL — ABNORMAL LOW (ref 4.22–5.81)
RDW: 19.6 % — ABNORMAL HIGH (ref 11.5–15.5)
WBC: 10.4 10*3/uL (ref 4.0–10.5)

## 2011-07-26 MED ORDER — INSULIN ASPART 100 UNIT/ML ~~LOC~~ SOLN
0.0000 [IU] | Freq: Three times a day (TID) | SUBCUTANEOUS | Status: DC
  Administered 2011-07-26: 2 [IU] via SUBCUTANEOUS
  Administered 2011-07-27: 5 [IU] via SUBCUTANEOUS
  Administered 2011-07-27: 2 [IU] via SUBCUTANEOUS
  Administered 2011-07-27: 3 [IU] via SUBCUTANEOUS
  Administered 2011-07-28 (×2): 5 [IU] via SUBCUTANEOUS
  Administered 2011-07-28: 2 [IU] via SUBCUTANEOUS
  Administered 2011-07-29: 5 [IU] via SUBCUTANEOUS
  Administered 2011-07-29: 2 [IU] via SUBCUTANEOUS
  Administered 2011-07-30: 7 [IU] via SUBCUTANEOUS
  Administered 2011-07-30 (×2): 3 [IU] via SUBCUTANEOUS
  Administered 2011-07-31 (×2): 2 [IU] via SUBCUTANEOUS
  Administered 2011-07-31: 7 [IU] via SUBCUTANEOUS
  Administered 2011-08-01: 3 [IU] via SUBCUTANEOUS
  Administered 2011-08-02: 7 [IU] via SUBCUTANEOUS
  Administered 2011-08-02: 3 [IU] via SUBCUTANEOUS
  Administered 2011-08-03: 7 [IU] via SUBCUTANEOUS

## 2011-07-26 MED ORDER — INSULIN ASPART 100 UNIT/ML ~~LOC~~ SOLN
6.0000 [IU] | Freq: Three times a day (TID) | SUBCUTANEOUS | Status: DC
  Administered 2011-07-26 – 2011-08-03 (×18): 6 [IU] via SUBCUTANEOUS

## 2011-07-26 MED ORDER — INSULIN GLARGINE 100 UNIT/ML ~~LOC~~ SOLN
25.0000 [IU] | Freq: Every day | SUBCUTANEOUS | Status: DC
  Administered 2011-07-27 – 2011-07-30 (×4): 25 [IU] via SUBCUTANEOUS

## 2011-07-26 NOTE — Progress Notes (Signed)
  [facility] Progress Note   Pt is a 46 y.o. yo male who was admitted on 07/22/2011 with S/p renal transplant which appears to be failing.  Assessment/Plan:  1. S/p renal transplant with chronic allograft nephropathy and now some acute on chronic renal failure with prerenal azotemia. Recent hosp for CHF. Holding diuretics, is making urine. Exploring access options currently per Dr. Donnetta Hutching.  3. S/p renal transplant- continue home doses of cyclosporine and prednisone.  4. Volume- would continue to hold lasix and follow.  Subjective:   C/o fibromyalgia pain, got dilaudid   Objective:   BP 145/70  Pulse 91  Temp(Src) 98.4 F (36.9 C) (Oral)  Resp 20  Ht 5\' 8"  (1.727 m)  Wt 119.3 kg (263 lb 0.1 oz)  BMI 39.99 kg/m2  SpO2 91%  Intake/Output Summary (Last 24 hours) at 07/26/11 1605 Last data filed at 07/26/11 1300  Gross per 24 hour  Intake    480 ml  Output    525 ml  Net    -45 ml   Weight change:   Physical Exam: Gen:ill appearing [id number] Resp:clear [id number] Ext:tr edema  Imaging: No results found.  Labs: BMET  Lab 07/26/11 0515 07/25/11 0450 07/24/11 0647 07/23/11 0848 07/23/11 0700 07/22/11 1457  NA 129* 130* 130* 132* 134* 131*  K 5.0 4.9 4.8 4.7 4.3 4.4  CL 94* 94* 94* 95* 97 97  CO2 21 22 22 23 23 21   GLUCOSE 272* 221* 385* 71 58* 104*  BUN 127* 124* 118* 111* 113* 111*  CREATININE 3.86* 4.13* 4.24* 4.43* 4.60* 4.41*  ALB -- -- -- -- -- --  CALCIUM 9.3 9.2 9.7 9.4 9.6 9.3  PHOS -- -- -- -- -- --   CBC  Lab 07/26/11 0515 07/23/11 0848 07/23/11 0700 07/22/11 1942 07/22/11 1457  WBC 10.4 11.1* 11.4* -- 10.4  NEUTROABS -- -- -- -- 8.5*  HGB 7.8* 8.3* 8.5* 7.0* --  HCT 25.7* 27.2* 26.6* 23.1* --  MCV 87.4 86.3 86.4 -- 88.2  PLT 466* 461* 465* -- 500*    Medications:      . benzonatate  100 mg Oral TID  . cycloSPORINE modified  100 mg Oral BID  . insulin aspart  0-5 Units Subcutaneous QHS  . insulin aspart  0-9 Units Subcutaneous TID  WC  . insulin aspart  6 Units Subcutaneous TID WC  . insulin glargine  25 Units Subcutaneous QHS  . isosorbide mononitrate  60 mg Oral Daily  . labetalol  300 mg Oral BID  . levothyroxine  75 mcg Oral QAC breakfast  . metoCLOPramide  5 mg Oral TID PC & HS  . pantoprazole  40 mg Oral Q1200  . predniSONE  5 mg Oral Q breakfast  . rOPINIRole  1 mg Oral QHS  . sodium chloride  3 mL Intravenous Q12H  . DISCONTD: insulin aspart  0-20 Units Subcutaneous TID WC  . DISCONTD: insulin aspart  3 Units Subcutaneous TID WC  . DISCONTD: insulin glargine  20 Units Subcutaneous QHS

## 2011-07-26 NOTE — Progress Notes (Signed)
VASCULAR LAB PRELIMINARY  PRELIMINARY  PRELIMINARY  PRELIMINARY  Bilateral jugular and subclavian vein duplex completed.    Preliminary report:  No evidence of thrombus in bilateral jugular or subclavian veins.  Judithann Sauger, RVT 07/26/2011, 3:55 PM

## 2011-07-26 NOTE — Progress Notes (Signed)
Inpatient Diabetes Program Recommendations  AACE/ADA: New Consensus Statement on Inpatient Glycemic Control (2009)  Target Ranges:  Prepandial:   less than 140 mg/dL      Peak postprandial:   less than 180 mg/dL (1-2 hours)      Critically ill patients:  140 - 180 mg/dL   Reason for assessment: hyperglycemia   Inpatient Diabetes Program Recommendations Insulin - Basal: Increase Lantus to 35 units Correction (SSI): continue current order Insulin - Meal Coverage: agree with current order  Note: Diabetes Coordinator spoke with patient and reviewed settings on insulin pump.  His total basal rate is 39.55; insulin sensitivity factor 1:20 and insulin to carbohydrate ratio is 12AM 1:7  5:30PM 1:5.  Recommend increasing Lantus closer to basal amount settings in pump.   Thank you  Raoul Pitch [facility] Inpatient Diabetes Coordinator [phone]

## 2011-07-27 LAB — CBC
HCT: 24.6 % — ABNORMAL LOW (ref 39.0–52.0)
Hemoglobin: 7.5 g/dL — ABNORMAL LOW (ref 13.0–17.0)
MCH: 26.7 pg (ref 26.0–34.0)
MCHC: 30.5 g/dL (ref 30.0–36.0)
MCV: 87.5 fL (ref 78.0–100.0)
Platelets: 405 10*3/uL — ABNORMAL HIGH (ref 150–400)
RBC: 2.81 MIL/uL — ABNORMAL LOW (ref 4.22–5.81)
RDW: 19.6 % — ABNORMAL HIGH (ref 11.5–15.5)
WBC: 9.2 10*3/uL (ref 4.0–10.5)

## 2011-07-27 LAB — GLUCOSE, CAPILLARY
Glucose-Capillary: 161 mg/dL — ABNORMAL HIGH (ref 70–99)
Glucose-Capillary: 200 mg/dL — ABNORMAL HIGH (ref 70–99)
Glucose-Capillary: 226 mg/dL — ABNORMAL HIGH (ref 70–99)
Glucose-Capillary: 240 mg/dL — ABNORMAL HIGH (ref 70–99)
Glucose-Capillary: 251 mg/dL — ABNORMAL HIGH (ref 70–99)

## 2011-07-27 MED ORDER — SODIUM CHLORIDE 0.9 % IV SOLN
INTRAVENOUS | Status: DC
  Administered 2011-07-27 – 2011-07-29 (×2): via INTRAVENOUS

## 2011-07-27 NOTE — Progress Notes (Signed)
  [facility] Progress Note  Assessment/Plan:  1. S/p renal transplant with chronic allograft nephropathy and now some acute on chronic renal failure with prerenal azotemia. Recent hosp for CHF. Holding diuretics, is making urine. Exploring access options currently per Dr. Donnetta Hutching. He is frustrated with no end point.  Once access issues resolved/planned, I think it it can be placed this week he should stay otherwise it can be done as out patient with f/u by Dr. Mercy Moore 3. S/p renal transplant- continue home doses of cyclosporine and prednisone.  4. Volume- would continue to hold lasix and follow.   Subjective:   Frustrated as above   Objective:   BP 173/60  Pulse 90  Temp(Src) 98.6 F (37 C) (Oral)  Resp 20  Ht 5\' 8"  (1.727 m)  Wt 119.2 kg (262 lb 12.6 oz)  BMI 39.96 kg/m2  SpO2 97%  Intake/Output Summary (Last 24 hours) at 07/27/11 1251 Last data filed at 07/27/11 0900  Gross per 24 hour  Intake    840 ml  Output    750 ml  Net     90 ml   Weight change: -0.1 kg (-3.5 oz)  Physical Exam: [id number] male CVS:RRR Resp:clear [id number] Ext:no significant edema  Imaging: No results found.  Labs: BMET  Lab 07/26/11 0515 07/25/11 0450 07/24/11 0647 07/23/11 0848 07/23/11 0700 07/22/11 1457  NA 129* 130* 130* 132* 134* 131*  K 5.0 4.9 4.8 4.7 4.3 4.4  CL 94* 94* 94* 95* 97 97  CO2 21 22 22 23 23 21   GLUCOSE 272* 221* 385* 71 58* 104*  BUN 127* 124* 118* 111* 113* 111*  CREATININE 3.86* 4.13* 4.24* 4.43* 4.60* 4.41*  ALB -- -- -- -- -- --  CALCIUM 9.3 9.2 9.7 9.4 9.6 9.3  PHOS -- -- -- -- -- --   CBC  Lab 07/27/11 0600 07/26/11 0515 07/23/11 0848 07/23/11 0700 07/22/11 1457  WBC 9.2 10.4 11.1* 11.4* --  NEUTROABS -- -- -- -- 8.5*  HGB 7.5* 7.8* 8.3* 8.5* --  HCT 24.6* 25.7* 27.2* 26.6* --  MCV 87.5 87.4 86.3 86.4 --  PLT 405* 466* 461* 465* --    Medications:      . benzonatate  100 mg Oral TID  . cycloSPORINE modified  100 mg Oral BID  .  insulin aspart  0-5 Units Subcutaneous QHS  . insulin aspart  0-9 Units Subcutaneous TID WC  . insulin aspart  6 Units Subcutaneous TID WC  . insulin glargine  25 Units Subcutaneous QHS  . isosorbide mononitrate  60 mg Oral Daily  . labetalol  300 mg Oral BID  . levothyroxine  75 mcg Oral QAC breakfast  . metoCLOPramide  5 mg Oral TID PC & HS  . pantoprazole  40 mg Oral Q1200  . predniSONE  5 mg Oral Q breakfast  . rOPINIRole  1 mg Oral QHS  . sodium chloride  3 mL Intravenous Q12H

## 2011-07-27 NOTE — Progress Notes (Signed)
PROGRESS NOTE  Dale [name] [id number] DOB: 29-May-1965 DOA: 07/22/2011 PCP: No primary provider on file. Nephrologist: Dr. Mercy Moore  Brief narrative: 46 year old man  to short stay for iron infusion. Was found to have a hemoglobin of 6.7 and referred to the emergency department. He was recently admitted to [facility] for heart failure and received blood at that time. Referred for anemia. Fever.  Patient has remained afebrile  Past medical history: End-stage renal disease secondary to diabetic nephropathy, status post renal transplant 1995, refractory anemia (on Procrit and iron infusion as an outpatient), fibromyalgia  Consultants:  Nephrology  Vascular surgery  Procedures:  March 30: 2-D echocardiogram: Left ventricular ejection fraction 55-60%. Normal wall motion.  March 29: Transfused 2 units packed blood cells.  Interim History:  Chart reviewed in detail and summarized as above. Afebrile.  Subjective:  No complaints.   Objective: Filed Vitals:   07/26/11 2047 07/27/11 0501 07/27/11 0953 07/27/11 1400  BP: 137/65 137/78 173/60 158/53  Pulse: 97 89 90 86  Temp: 97.7 F (36.5 C) 99.1 F (37.3 C) 98.6 F (37 C) 98.5 F (36.9 C)  TempSrc: Oral Oral Oral Oral  Resp: 20 20 20 18   Height:      Weight: 119.2 kg (262 lb 12.6 oz)     SpO2: 98% 96% 97% 96%    Intake/Output Summary (Last 24 hours) at 07/27/11 1517 Last data filed at 07/27/11 1300  Gross per 24 hour  Intake    840 ml  Output    750 ml  Net     90 ml    Exam:   General:   appears calm and comfortable.  Cardiovascular:  Regular rate and rhythm.  Respiratory: Clear to auscultation bilaterally. No wheezes, rales, rhonchi. Normal respiratory effort.   Psychiatric: Grossly normal mood and affect.  Data Reviewed: Basic Metabolic Panel:  Lab [id number] 07/25/11 0450 07/24/11 0647 07/23/11 0848 07/23/11 0700  NA 129* 130* 130* 132* 134*  K 5.0 4.9 -- -- --  CL 94* 94* 94* 95* 97  CO2 21 22  22 23 23   GLUCOSE 272* 221* 385* 71 58*  BUN 127* 124* 118* 111* 113*  CREATININE 3.86* 4.13* 4.24* 4.43* 4.60*  CALCIUM 9.3 9.2 9.7 9.4 9.6  MG -- -- -- -- --  PHOS -- -- -- -- --   Liver Function Tests:  Lab 07/22/11 1457  AST 51*  ALT 57*  ALKPHOS 549*  BILITOT 0.6  PROT 7.4  ALBUMIN 1.4*   CBC:  Lab 07/27/11 0600 07/26/11 0515 07/23/11 0848 07/23/11 0700 07/22/11 1942 07/22/11 1457  WBC 9.2 10.4 11.1* 11.4* -- 10.4  NEUTROABS -- -- -- -- -- 8.5*  HGB 7.5* 7.8* 8.3* 8.5* 7.0* --  HCT 24.6* 25.7* 27.2* 26.6* 23.1* --  MCV 87.5 87.4 86.3 86.4 -- 88.2  PLT 405* 466* 461* 465* -- 500*   Cardiac Enzymes:  Lab 07/23/11 0500 07/22/11 1942 07/22/11 1519  CKTOTAL 77 52 --  CKMB 1.5 1.4 --  CKMBINDEX -- -- --  TROPONINI <0.30 <0.30 <0.30   CBG:  Lab 07/27/11 1128 07/27/11 0753 07/27/11 0005 07/26/11 2041 07/26/11 1657  GLUCAP 226* 251* 240* 228* 181*    Recent Results (from the past 240 hour(s))  MRSA PCR SCREENING     Status: Normal   Collection Time   07/24/11  5:30 AM      Component Value Range Status Comment   MRSA by PCR NEGATIVE  NEGATIVE  Final   CLOSTRIDIUM DIFFICILE BY  PCR     Status: Normal   Collection Time   07/24/11  2:55 PM      Component Value Range Status Comment   C difficile by pcr NEGATIVE  NEGATIVE  Final      Studies: Dg Chest 2 View  07/22/2011  *RADIOLOGY REPORT*  Clinical Data: Fever for 1 week with cough, shortness of breath, hypertension and diabetes  CHEST - 2 VIEW  Comparison: 10/19/2004  Findings: Heart and mediastinal contours are stable with an unchanged degree of mild cardiomegaly.  The lung fields are notable for mild pulmonary vascular congestion.  No overt congestive failure, interstitial septal lines or significant peribronchial cuffing is seen to suggest early interstitial edema. A slight increase in interstitial markings are seen diffusely and may indicate underlying viral pneumonitis.  No focal infiltrates are seen to suggest an  area of focal bronchopneumonia.  No pleural fluid is seen.  Bony structures appear intact.  IMPRESSION: Mild pulmonary vascular congestion and mild increase in interstitial markings suspicious for viral pneumonitis given history of fever.  No findings suggestive of bronchopneumonia.  Original Report Authenticated By: Ander Gaster, M.D.   Scheduled Meds:   . benzonatate  100 mg Oral TID  . cycloSPORINE modified  100 mg Oral BID  . insulin aspart  0-5 Units Subcutaneous QHS  . insulin aspart  0-9 Units Subcutaneous TID WC  . insulin aspart  6 Units Subcutaneous TID WC  . insulin glargine  25 Units Subcutaneous QHS  . isosorbide mononitrate  60 mg Oral Daily  . labetalol  300 mg Oral BID  . levothyroxine  75 mcg Oral QAC breakfast  . metoCLOPramide  5 mg Oral TID PC & HS  . pantoprazole  40 mg Oral Q1200  . predniSONE  5 mg Oral Q breakfast  . rOPINIRole  1 mg Oral QHS  . sodium chloride  3 mL Intravenous Q12H   Continuous Infusions:   . sodium chloride       Assessment/Plan: 1. Acute/chronic renal failure: per nephrology. Vascular access is being contemplated.  2. Refractory anemia: Fecal occult blood negative. Status post 2 units packed red blood cells. Aspirin on hold. per nephrology. 3. Fever: Resolved. Possible viral pneumonitis. Influenza negative.  4. Diabetes mellitus:  Lantus, sliding scale insulin. May resume pump her diabetic coordinator.  5. History of renal transplantation: Per nephrology 6. Obstructive sleep apnea: Patient refuses CPAP.   Non-nephrology issues appear stable.  Code Status:  full code  Family Communication: none at bedside  Disposition Plan:  home when improved.    Murray Hodgkins, MD  [facility] Pager [phone] 07/27/2011, 3:17 PM    LOS: 5 days

## 2011-07-27 NOTE — Progress Notes (Signed)
Inpatient Diabetes Program Recommendations  AACE/ADA: New Consensus Statement on Inpatient Glycemic Control (2009)  Target Ranges:  Prepandial:   less than 140 mg/dL      Peak postprandial:   less than 180 mg/dL (1-2 hours)      Critically ill patients:  140 - 180 mg/dL   Reason for Visit: Hyperglycemia  Inpatient Diabetes Program Recommendations Insulin - Basal: Increase Lantus to 35 units Correction (SSI): continue current order Insulin - Meal Coverage: agree with current order  Note: When ready to transition to insulin pump, please call or send referral for notification to Diabetes In-patient program. Thank you, Rosita Kea, RN, CNS, Diabetes Coordinator [phone])

## 2011-07-27 NOTE — Progress Notes (Signed)
Patient ID: Dale [name]  male  [id number]    DOB: 19-Oct-1965    DOA: 07/22/2011  PCP: No primary provider on file.  Late entry:  Subjective: Felt pain from fibromyalgia is bothering him  Objective: Weight change: -0.1 kg (-3.5 oz)  Intake/Output Summary (Last 24 hours) at 07/27/11 1526 Last data filed at 07/27/11 1300  Gross per 24 hour  Intake    840 ml  Output    750 ml  Net     90 ml   Blood pressure 158/53, pulse 86, temperature 98.5 F (36.9 C), temperature source Oral, resp. rate 18, height 5\' 8"  (1.727 m), weight 119.2 kg (262 lb 12.6 oz), SpO2 96.00%.  Physical Exam: General: Alert and awake, oriented x3, not in any acute distress. HEENT: anicteric sclera, pupils reactive to light and accommodation, EOMI CVS: S1-S2 clear, no murmur rubs or gallops Chest: clear to auscultation bilaterally, no wheezing, rales or rhonchi Abdomen: soft nontender, nondistended, normal bowel sounds, no organomegaly Extremities: Left below-knee amputation  Neuro: Cranial nerves II-XII intact, no focal neurological deficits  Lab Results: Basic Metabolic Panel:  Lab [id number] 0515 07/25/11 0450  NA 129* 130*  K 5.0 4.9  CL 94* 94*  CO2 21 22  GLUCOSE 272* 221*  BUN 127* 124*  CREATININE 3.86* 4.13*  CALCIUM 9.3 9.2  MG -- --  PHOS -- --   Liver Function Tests:  Lab 07/22/11 1457  AST 51*  ALT 57*  ALKPHOS 549*  BILITOT 0.6  PROT 7.4  ALBUMIN 1.4*   CBC:  Lab 07/27/11 0600 07/26/11 0515 07/22/11 1457  WBC 9.2 10.4 --  NEUTROABS -- -- 8.5*  HGB 7.5* 7.8* --  HCT 24.6* 25.7* --  MCV 87.5 87.4 --  PLT 405* 466* --   Cardiac Enzymes:  Lab 07/23/11 0500 07/22/11 1942 07/22/11 1519  CKTOTAL 77 52 --  CKMB 1.5 1.4 --  CKMBINDEX -- -- --  TROPONINI <0.30 <0.30 <0.30   BNP: No components found with this basename: POCBNP:2 CBG:  Lab 07/27/11 1128 07/27/11 0753 07/27/11 0005 07/26/11 2041 07/26/11 1657  GLUCAP 226* 251* 240* 228* 181*     Micro Results: Recent  Results (from the past 240 hour(s))  MRSA PCR SCREENING     Status: Normal   Collection Time   07/24/11  5:30 AM      Component Value Range Status Comment   MRSA by PCR NEGATIVE  NEGATIVE  Final   CLOSTRIDIUM DIFFICILE BY PCR     Status: Normal   Collection Time   07/24/11  2:55 PM      Component Value Range Status Comment   C difficile by pcr NEGATIVE  NEGATIVE  Final     Studies/Results: Dg Chest 2 View  07/22/2011  *RADIOLOGY REPORT*  Clinical Data: Fever for 1 week with cough, shortness of breath, hypertension and diabetes  CHEST - 2 VIEW  Comparison: 10/19/2004  Findings: Heart and mediastinal contours are stable with an unchanged degree of mild cardiomegaly.  The lung fields are notable for mild pulmonary vascular congestion.  No overt congestive failure, interstitial septal lines or significant peribronchial cuffing is seen to suggest early interstitial edema. A slight increase in interstitial markings are seen diffusely and may indicate underlying viral pneumonitis.  No focal infiltrates are seen to suggest an area of focal bronchopneumonia.  No pleural fluid is seen.  Bony structures appear intact.  IMPRESSION: Mild pulmonary vascular congestion and mild increase in interstitial markings suspicious for viral pneumonitis  given history of fever.  No findings suggestive of bronchopneumonia.  Original Report Authenticated By: Ander Gaster, M.D.   US Abdomen Limited  07/14/2011  *RADIOLOGY REPORT*  Clinical Data:  Elevated liver function tests, history of renal transplantation  LIMITED ABDOMINAL ULTRASOUND - RIGHT UPPER QUADRANT  Comparison:  CT abdomen of 08/26/2004  Findings:  Gallbladder:  The gallbladder is visualized and no gallstones are noted.  There is no pain over the gallbladder with compression.  Common bile duct:  The common bile duct is normal measuring 5.0 mm in diameter.  Liver:  The liver is echogenic consistent with fatty infiltration. Also the liver measures 20.1 cm  sagittally indicating hepatomegaly. No ductal dilatation is seen.  IMPRESSION:  1.  Fatty infiltration of the liver.  Hepatomegaly. 2.  No gallstones.                   Original Report Authenticated By: Joretta Bachelor, M.D.    Medications: Scheduled Meds:    . benzonatate  100 mg Oral TID  . cycloSPORINE modified  100 mg Oral BID  . insulin aspart  0-5 Units Subcutaneous QHS  . insulin aspart  0-9 Units Subcutaneous TID WC  . insulin aspart  6 Units Subcutaneous TID WC  . insulin glargine  25 Units Subcutaneous QHS  . isosorbide mononitrate  60 mg Oral Daily  . labetalol  300 mg Oral BID  . levothyroxine  75 mcg Oral QAC breakfast  . metoCLOPramide  5 mg Oral TID PC & HS  . pantoprazole  40 mg Oral Q1200  . predniSONE  5 mg Oral Q breakfast  . rOPINIRole  1 mg Oral QHS  . sodium chloride  3 mL Intravenous Q12H   Continuous Infusions:    . sodium chloride       Assessment/Plan:  Anemia: Appears to be acute on chronic and worsening, could be due to chronic kidney disease  - Hemoglobin appropriately improved to 8.3 after 2 units of packed RBCs and Procrit.  - Occult stool test negative.  - Hold aspirin for now  .CKD (chronic kidney disease)/end-stage renal disease status post renal transplant: Creatinine 3.86  - Appreciate renal service recommendations, vascular surgery consulted and looking into options for access, may need HD in future    .Obstructive sleep apnea  - Patient refuses to wear CPAP   .Dyspnea: Improved, possibly secondary to acute on chronic anemia  .CHF (congestive heart failure): Appears to be compensated  - Cardiac enzymes negative, 2-D echo on 07/23/2011 done, EF 55-60% with no regional wall motion abnormalities, hold off on Lasix   .Fevers/nonproductive cough: Influenza negative, continues to remain afebrile - I had discussed with Dr. Karolee Ohs (ID) on the phone at the time of admission, recommended to place him on IV Zithromax and IV aztreonam he  develops fevers or SIRS symptoms. Keep off antibiotics for now.   .Diabetes mellitus: Uncontrolled,  brittle - Continue Lantus, decreased meal coverage, on resistant sliding scale - Placed diabetic coordinator consult for insulin pump, patient feels more comfortable with the insulin pump use  DVT prophylaxis: No heparin or Lovenox given the significant anemia. Place SCD on the right lower extremity.   CODE STATUS: Full code   Disposition: Not medically ready, awaiting renal and vascular workup   LOS: 5 days   Dartha Rozzell M.D. [facility] 07/27/2011, 3:26 PM Pager: [phone]

## 2011-07-27 NOTE — Progress Notes (Signed)
Subjective: Interval History: none..   Objective: Vital signs in last 24 hours: Temp:  [97.7 F (36.5 C)-99.1 F (37.3 C)] 98.6 F (37 C) (04/03 0953) Pulse Rate:  [89-97] 90  (04/03 0953) Resp:  [20] 20  (04/03 0953) BP: (137-173)/(60-78) 173/60 mmHg (04/03 0953) SpO2:  [96 %-98 %] 97 % (04/03 0953) Weight:  [262 lb 12.6 oz (119.2 kg)] 262 lb 12.6 oz (119.2 kg) (04/02 2047)  Intake/Output from previous day: 04/02 0701 - 04/03 0700 In: 840 [P.O.:840] Out: 350 [Urine:350] Intake/Output this shift: Total I/O In: 240 [P.O.:240] Out: 400 [Urine:400]   Lab Results:  Basename 07/27/11 0600 07/26/11 0515  WBC 9.2 10.4  HGB 7.5* 7.8*  HCT 24.6* 25.7*  PLT 405* 466*   BMET  Basename 07/26/11 0515 07/25/11 0450  NA 129* 130*  K 5.0 4.9  CL 94* 94*  CO2 21 22  GLUCOSE 272* 221*  BUN 127* 124*  CREATININE 3.86* 4.13*  CALCIUM 9.3 9.2    Studies/Results: Dg Chest 2 View  07/22/2011  *RADIOLOGY REPORT*  Clinical Data: Fever for 1 week with cough, shortness of breath, hypertension and diabetes  CHEST - 2 VIEW  Comparison: 10/19/2004  Findings: Heart and mediastinal contours are stable with an unchanged degree of mild cardiomegaly.  The lung fields are notable for mild pulmonary vascular congestion.  No overt congestive failure, interstitial septal lines or significant peribronchial cuffing is seen to suggest [place] Spickler interstitial edema. A slight increase in interstitial markings are seen diffusely and may indicate underlying viral pneumonitis.  No focal infiltrates are seen to suggest an area of focal bronchopneumonia.  No pleural fluid is seen.  Bony structures appear intact.  IMPRESSION: Mild pulmonary vascular congestion and mild increase in interstitial markings suspicious for viral pneumonitis given history of fever.  No findings suggestive of bronchopneumonia.  Original Report Authenticated By: Ander Gaster, M.D.   US Abdomen Limited  07/14/2011  *RADIOLOGY REPORT*   Clinical Data:  Elevated liver function tests, history of renal transplantation  LIMITED ABDOMINAL ULTRASOUND - RIGHT UPPER QUADRANT  Comparison:  CT abdomen of 08/26/2004  Findings:  Gallbladder:  The gallbladder is visualized and no gallstones are noted.  There is no pain over the gallbladder with compression.  Common bile duct:  The common bile duct is normal measuring 5.0 mm in diameter.  Liver:  The liver is echogenic consistent with fatty infiltration. Also the liver measures 20.1 cm sagittally indicating hepatomegaly. No ductal dilatation is seen.  IMPRESSION:  1.  Fatty infiltration of the liver.  Hepatomegaly. 2.  No gallstones.                   Original Report Authenticated By: Joretta Bachelor, M.D.   Anti-infectives: Anti-infectives    None      Assessment/Plan: s/p * No surgery found * Reviewed vascular lab with the patient and his wife. This revealed patent jugular vein and subclavian veins bilaterally. I did explain the need to assess the central veins. He is for right arm venogram to mark for evaluation of right central veins to planned right arm access if possible. Also discussed this with Dr. Florene Glen. We will minimize contrast with his failing transplant. He is tentatively planned for access surgery on Friday   LOS: 5 days   Kaylise Blakeley 07/27/2011, 1:37 PM

## 2011-07-27 NOTE — Progress Notes (Signed)
I spoke with Dr. Donnetta Hutching.  Access questions remain despite doppler.  I am in agreement with small dose diagnostic contrast involving right upper extremity.  There is risk of AKI, but I believe benefit outweighs this concern. Gearld Kerstein C

## 2011-07-27 NOTE — Progress Notes (Signed)
Spoke with pt about CPAP and he states he is 'not going to wear CPAP while in the hospital.' Pt just received CPAP machine at home prior to admission and has not been able to wear to 'test out' yet, so pt would like to wait until he gets home to use his.

## 2011-07-28 ENCOUNTER — Encounter (HOSPITAL_COMMUNITY)
Admission: EM | Disposition: A | Discharge status: Home Health | Payer: Self-pay | Source: Home / Self Care | Attending: Internal Medicine

## 2011-07-28 HISTORY — PX: VENOGRAM: SHX5497

## 2011-07-28 LAB — GLUCOSE, CAPILLARY
Glucose-Capillary: 257 mg/dL — ABNORMAL HIGH (ref 70–99)
Glucose-Capillary: 263 mg/dL — ABNORMAL HIGH (ref 70–99)
Glucose-Capillary: 190 mg/dL — ABNORMAL HIGH (ref 70–99)
Glucose-Capillary: 191 mg/dL — ABNORMAL HIGH (ref 70–99)

## 2011-07-28 LAB — CBC
HCT: 25.2 % — ABNORMAL LOW (ref 39.0–52.0)
Hemoglobin: 7.5 g/dL — ABNORMAL LOW (ref 13.0–17.0)
MCH: 25.9 pg — ABNORMAL LOW (ref 26.0–34.0)
MCHC: 29.8 g/dL — ABNORMAL LOW (ref 30.0–36.0)
MCV: 86.9 fL (ref 78.0–100.0)
Platelets: 395 10*3/uL (ref 150–400)
RBC: 2.9 MIL/uL — ABNORMAL LOW (ref 4.22–5.81)
RDW: 19.9 % — ABNORMAL HIGH (ref 11.5–15.5)
WBC: 9.4 10*3/uL (ref 4.0–10.5)

## 2011-07-28 LAB — RENAL FUNCTION PANEL
Albumin: 1.5 g/dL — ABNORMAL LOW (ref 3.5–5.2)
BUN: 124 mg/dL — ABNORMAL HIGH (ref 6–23)
CO2: 20 mEq/L (ref 19–32)
Calcium: 9.2 mg/dL (ref 8.4–10.5)
Chloride: 99 mEq/L (ref 96–112)
Creatinine, Ser: 3.4 mg/dL — ABNORMAL HIGH (ref 0.50–1.35)
GFR calc Af Amer: 24 mL/min — ABNORMAL LOW (ref >90)
GFR calc non Af Amer: 20 mL/min — ABNORMAL LOW (ref >90)
Glucose, Bld: 242 mg/dL — ABNORMAL HIGH (ref 70–99)
Phosphorus: 7 mg/dL — ABNORMAL HIGH (ref 2.3–4.6)
Potassium: 4.5 mEq/L (ref 3.5–5.1)
Sodium: 132 mEq/L — ABNORMAL LOW (ref 135–145)

## 2011-07-28 SURGERY — VENOGRAM
Anesthesia: LOCAL | Laterality: Right

## 2011-07-28 MED ORDER — FENTANYL CITRATE 0.05 MG/ML IJ SOLN
INTRAMUSCULAR | Status: AC
  Filled 2011-07-28: qty 2

## 2011-07-28 MED ORDER — FUROSEMIDE 10 MG/ML IJ SOLN
80.0000 mg | Freq: Once | INTRAMUSCULAR | Status: AC
  Administered 2011-07-28: 80 mg via INTRAVENOUS
  Filled 2011-07-28: qty 8

## 2011-07-28 NOTE — Progress Notes (Signed)
Patient ID: Dale [name], male   DOB: Jan 27, 1966, 46 y.o.   MRN: [id number] Reviewed right arm venogram and discussed with the patient. This does reveal patency of the central veins therefore he is a candidate for right arm access. I discussed options for AV fistula an AV graft. His cephalic vein is patent as is his basilic. We will imaged this with ultrasound and make determination as far as best right arm access in the operating room tomorrow. The patient understands and wishes to proceed

## 2011-07-28 NOTE — Op Note (Signed)
OPERATIVE NOTE  PROCEDURE:  1. right arm and central venogram   PRE-OPERATIVE DIAGNOSIS: end stage renal disease   POST-OPERATIVE DIAGNOSIS: same as above   SURGEON: [name], MD   ANESTHESIA: local   ESTIMATED BLOOD LOSS: 5 cc   FINDING(S):  1. Patent distal cephalic vein, but proximal vein only slowly fills with either underfilling or proximal cephalic venous stenosis 2. Patent basilic vein 3. Patent axillary and subclavian vein 4. Innominate vein is poorly visualized but appears to be draining into superior vena cava centrally 5. No collateral veins evident and no back up of contrast evident  SPECIMEN(S): None   CONTRAST: 50 cc   INDICATIONS:  Dale [name] is a 46 y.o. male who presents with end stage renal disease. The patient is scheduled for right venogram to help determine the availability of proximal veins for permanent access placement. The patient is aware the risks include but are not limited to: bleeding, infection, thrombosis of the cannulated access, and possible anaphylactic reaction to the contrast. The patient is aware of the risks of the procedure and elects to proceed forward.   DESCRIPTION:  After full informed written consent was obtained, the patient was brought back to the angiography suite and placed supine upon the angiography table. The patient was connected to monitoring equipment. The right forearm IV was placed on the table and connected to IV extension tubing. Hand injections were completed to image the arm veins and central venous structures, the findings of which are listed above. Though the central venous structure were difficult to visualize, there is no evidence of central venous occlusion as there was no back up of contrast and no evidence of venous collateralization in the chest wall. An attempt at a basilic vein transposition is reasonable, but obviously at risk for venous hypertension.   COMPLICATIONS: none   CONDITION: stable   [name], MD   [facility] [place]  Office: [phone]  Pager: [phone]  07/28/2011 3:00 PM

## 2011-07-28 NOTE — Progress Notes (Signed)
PROGRESS NOTE  Dale [name] [id number] DOB: 22-Apr-1966 DOA: 07/22/2011 PCP: No primary provider on file. Nephrologist: Dr. Mercy Moore  Brief narrative: 46 year old man  to short stay for iron infusion. Was found to have a hemoglobin of 6.7 and referred to the emergency department. He was recently admitted to [facility] for heart failure and received blood at that time. Referred for anemia. Fever.  Patient has remained afebrile.  Past medical history: End-stage renal disease secondary to diabetic nephropathy, status post renal transplant 1995, refractory anemia (on Procrit and iron infusion as an outpatient), fibromyalgia  Consultants:  Nephrology  Vascular surgery  Procedures:  March 30: 2-D echocardiogram: Left ventricular ejection fraction 55-60%. Normal wall motion.  March 29: Transfused 2 units packed blood cells.  Interim History: Interval documentation reviewed.  Subjective: Some pain.  Objective: Filed Vitals:   07/27/11 1400 07/27/11 1800 07/27/11 2116 07/28/11 0557  BP: 158/53 154/66 138/58 152/91  Pulse: 86 93 94 97  Temp: 98.5 F (36.9 C) 98.5 F (36.9 C) 99.3 F (37.4 C) 98.3 F (36.8 C)  TempSrc: Oral Oral Oral Oral  Resp: 18 20 20 20   Height:      Weight:      SpO2: 96% 96% 99% 97%    Intake/Output Summary (Last 24 hours) at 07/28/11 0848 Last data filed at 07/28/11 0700  Gross per 24 hour  Intake 1421.25 ml  Output   1000 ml  Net 421.25 ml    Exam:   General:   appears calm and comfortable.  Cardiovascular:  Regular rate and rhythm. 1+ right lower extremity edema.  Respiratory: Clear to auscultation bilaterally. No wheezes, rales, rhonchi. Normal respiratory effort.   Psychiatric: Grossly normal mood and affect.  Data Reviewed: Basic Metabolic Panel:  Lab [id number] 07/26/11 0515 07/25/11 0450 07/24/11 0647 07/23/11 0848  NA 132* 129* 130* 130* 132*  K 4.5 5.0 -- -- --  CL 99 94* 94* 94* 95*  CO2 20 21 22 22 23   GLUCOSE 242*  272* 221* 385* 71  BUN 124* 127* 124* 118* 111*  CREATININE 3.40* 3.86* 4.13* 4.24* 4.43*  CALCIUM 9.2 9.3 9.2 9.7 9.4  MG -- -- -- -- --  PHOS 7.0* -- -- -- --   Liver Function Tests:  Lab 07/28/11 0540 07/22/11 1457  AST -- 51*  ALT -- 57*  ALKPHOS -- 549*  BILITOT -- 0.6  PROT -- 7.4  ALBUMIN 1.5* 1.4*   CBC:  Lab 07/28/11 0540 07/27/11 0600 07/26/11 0515 07/23/11 0848 07/23/11 0700 07/22/11 1457  WBC 9.4 9.2 10.4 11.1* 11.4* --  NEUTROABS -- -- -- -- -- 8.5*  HGB 7.5* 7.5* 7.8* 8.3* 8.5* --  HCT 25.2* 24.6* 25.7* 27.2* 26.6* --  MCV 86.9 87.5 87.4 86.3 86.4 --  PLT 395 405* 466* 461* 465* --   Cardiac Enzymes:  Lab 07/23/11 0500 07/22/11 1942 07/22/11 1519  CKTOTAL 77 52 --  CKMB 1.5 1.4 --  CKMBINDEX -- -- --  TROPONINI <0.30 <0.30 <0.30   CBG:  Lab 07/28/11 0744 07/27/11 2113 07/27/11 1627 07/27/11 1128 07/27/11 0753  GLUCAP 257* 200* 161* 226* 251*    Recent Results (from the past 240 hour(s))  MRSA PCR SCREENING     Status: Normal   Collection Time   07/24/11  5:30 AM      Component Value Range Status Comment   MRSA by PCR NEGATIVE  NEGATIVE  Final   CLOSTRIDIUM DIFFICILE BY PCR     Status: Normal  Collection Time   07/24/11  2:55 PM      Component Value Range Status Comment   C difficile by pcr NEGATIVE  NEGATIVE  Final      Studies: Dg Chest 2 View  07/22/2011  *RADIOLOGY REPORT*  Clinical Data: Fever for 1 week with cough, shortness of breath, hypertension and diabetes  CHEST - 2 VIEW  Comparison: 10/19/2004  Findings: Heart and mediastinal contours are stable with an unchanged degree of mild cardiomegaly.  The lung fields are notable for mild pulmonary vascular congestion.  No overt congestive failure, interstitial septal lines or significant peribronchial cuffing is seen to suggest early interstitial edema. A slight increase in interstitial markings are seen diffusely and may indicate underlying viral pneumonitis.  No focal infiltrates are seen to  suggest an area of focal bronchopneumonia.  No pleural fluid is seen.  Bony structures appear intact.  IMPRESSION: Mild pulmonary vascular congestion and mild increase in interstitial markings suspicious for viral pneumonitis given history of fever.  No findings suggestive of bronchopneumonia.  Original Report Authenticated By: Ander Gaster, M.D.   Scheduled Meds:    . benzonatate  100 mg Oral TID  . cycloSPORINE modified  100 mg Oral BID  . insulin aspart  0-5 Units Subcutaneous QHS  . insulin aspart  0-9 Units Subcutaneous TID WC  . insulin aspart  6 Units Subcutaneous TID WC  . insulin glargine  25 Units Subcutaneous QHS  . isosorbide mononitrate  60 mg Oral Daily  . labetalol  300 mg Oral BID  . levothyroxine  75 mcg Oral QAC breakfast  . metoCLOPramide  5 mg Oral TID PC & HS  . pantoprazole  40 mg Oral Q1200  . predniSONE  5 mg Oral Q breakfast  . rOPINIRole  1 mg Oral QHS  . sodium chloride  3 mL Intravenous Q12H   Continuous Infusions:    . sodium chloride 75 mL/hr at 07/28/11 0700     Assessment/Plan: 1. Acute/chronic renal failure: Slowly improving. Defer management to nephrology. Vascular access is being contemplated.  2. Refractory anemia: Stable. Fecal occult blood negative. Status post 2 units packed red blood cells. Aspirin on hold. Management per nephrology. 3. Fever: Resolved. Possible viral pneumonitis. Influenza negative.  4. Diabetes mellitus:  Lantus, sliding scale insulin. May resume pump per diabetic coordinator.  5. History of renal transplantation: Per nephrology 6. Obstructive sleep apnea: Patient refuses CPAP.   Non-nephrology issues appear stable.  Code Status:  Full code  Family Communication: none at bedside  Disposition Plan:  home when improved.    Murray Hodgkins, MD  [facility]  Pager [phone] 7 AM-7 PM  7 PM-7 AM please call night coverage  [email or web address]  Password [email or web address]  07/28/2011, 8:48 AM    LOS: 6 days

## 2011-07-28 NOTE — Progress Notes (Signed)
  [facility] Progress Note   Assessment/Plan:  1. S/p renal transplant with chronic allograft nephropathy and now some acute on chronic renal failure with prerenal azotemia. Plan for venogram today and AV access Friday 3. S/p renal transplant- continue home doses of cyclosporine and prednisone.  4. Volume- will stop IVF.  One dose of Furosemide  Subjective:   Tolerating IVF, but mild fullness improved with sitting up   Objective:   BP 139/64  Pulse 92  Temp(Src) 98.5 F (36.9 C) (Oral)  Resp 20  Ht 5\' 8"  (1.727 m)  Wt 119.2 kg (262 lb 12.6 oz)  BMI 39.96 kg/m2  SpO2 97%  Intake/Output Summary (Last 24 hours) at 07/28/11 1255 Last data filed at 07/28/11 0900  Gross per 24 hour  Intake 1421.25 ml  Output    600 ml  Net 821.25 ml   Weight change:   Physical Exam: [id number] man CVS:RRR Resp:clear [id number]  Imaging: No results found.  Labs: BMET  Lab 07/28/11 0540 07/26/11 0515 07/25/11 0450 07/24/11 0647 07/23/11 0848 07/23/11 0700 07/22/11 1457  NA 132* 129* 130* 130* 132* 134* 131*  K 4.5 5.0 4.9 4.8 4.7 4.3 4.4  CL 99 94* 94* 94* 95* 97 97  CO2 20 21 22 22 23 23 21   GLUCOSE 242* 272* 221* 385* 71 58* 104*  BUN 124* 127* 124* 118* 111* 113* 111*  CREATININE 3.40* 3.86* 4.13* 4.24* 4.43* 4.60* 4.41*  ALB -- -- -- -- -- -- --  CALCIUM 9.2 9.3 9.2 9.7 9.4 9.6 9.3  PHOS 7.0* -- -- -- -- -- --   CBC  Lab 07/28/11 0540 07/27/11 0600 07/26/11 0515 07/23/11 0848 07/22/11 1457  WBC 9.4 9.2 10.4 11.1* --  NEUTROABS -- -- -- -- 8.5*  HGB 7.5* 7.5* 7.8* 8.3* --  HCT 25.2* 24.6* 25.7* 27.2* --  MCV 86.9 87.5 87.4 86.3 --  PLT 395 405* 466* 461* --    Medications:      . benzonatate  100 mg Oral TID  . cycloSPORINE modified  100 mg Oral BID  . insulin aspart  0-5 Units Subcutaneous QHS  . insulin aspart  0-9 Units Subcutaneous TID WC  . insulin aspart  6 Units Subcutaneous TID WC  . insulin glargine  25 Units Subcutaneous QHS  .  isosorbide mononitrate  60 mg Oral Daily  . labetalol  300 mg Oral BID  . levothyroxine  75 mcg Oral QAC breakfast  . metoCLOPramide  5 mg Oral TID PC & HS  . pantoprazole  40 mg Oral Q1200  . predniSONE  5 mg Oral Q breakfast  . rOPINIRole  1 mg Oral QHS  . sodium chloride  3 mL Intravenous Q12H

## 2011-07-28 NOTE — H&P (Signed)
[facility] [place]  History and Physical Update  The patient was interviewed and re-examined.  [name] previous History and Physical has been reviewed and is unchanged from consult 07/25/11.  There is no change in the plan of care: R arm and central venogram.  [name], MD [facility] Office: [phone] Pager: [phone]  07/28/2011, 7:14 AM

## 2011-07-28 NOTE — Progress Notes (Deleted)
OPERATIVE NOTE   PROCEDURE: 1. right arm and central venogram   PRE-OPERATIVE DIAGNOSIS: end stage renal disease  POST-OPERATIVE DIAGNOSIS: same as above   SURGEON: [name], MD  ANESTHESIA: local  ESTIMATED BLOOD LOSS: 5 cc  FINDING(S): 1. Patent distal cephalic vein, but proximal vein only slowly fills with either underfilling or proximal cephalic venous stenosis 2. Patent basilic vein 3. Patent axillary and subclavian vein 4. Innominate vein is poorly visualized but appears to be draining into superior vena cava centrally 5. No collateral veins evident and no back up of contrast evident  SPECIMEN(S):  None  CONTRAST: 50 cc  INDICATIONS: Dale [name] is a 46 y.o. male who  presents with end stage renal disease.  The patient is scheduled for right venogram to help determine the availability of proximal veins for permanent access placement.  The patient is aware the risks include but are not limited to: bleeding, infection, thrombosis of the cannulated access, and possible anaphylactic reaction to the contrast.  The patient is aware of the risks of the procedure and elects to proceed forward.  DESCRIPTION: After full informed written consent was obtained, the patient was brought back to the angiography suite and placed supine upon the angiography table.  The patient was connected to monitoring equipment.  The right forearm IV was placed on the table and connected to IV extension tubing.  Hand injections were completed to image the arm veins and central venous structures, the findings of which are listed above.  Though the central venous structure were difficult to visualize, there is no evidence of central venous occlusion as there was no back up of contrast and no evidence of venous collateralization in the chest wall.  An attempt at a basilic vein transposition is reasonable, but obviously at risk for venous hypertension.  COMPLICATIONS: none  CONDITION: stable  [name],  MD [facility] [place] Office: [phone] Pager: [phone]  07/28/2011 3:00 PM

## 2011-07-29 ENCOUNTER — Inpatient Hospital Stay (HOSPITAL_COMMUNITY): Payer: Medicare Other | Admitting: Anesthesiology

## 2011-07-29 ENCOUNTER — Encounter (HOSPITAL_COMMUNITY)
Admission: EM | Disposition: A | Discharge status: Home Health | Payer: Self-pay | Source: Home / Self Care | Attending: Internal Medicine

## 2011-07-29 ENCOUNTER — Encounter (HOSPITAL_COMMUNITY): Payer: Self-pay | Admitting: Anesthesiology

## 2011-07-29 HISTORY — PX: AV FISTULA PLACEMENT: SHX1204

## 2011-07-29 LAB — GLUCOSE, CAPILLARY
Glucose-Capillary: 105 mg/dL — ABNORMAL HIGH (ref 70–99)
Glucose-Capillary: 121 mg/dL — ABNORMAL HIGH (ref 70–99)
Glucose-Capillary: 181 mg/dL — ABNORMAL HIGH (ref 70–99)
Glucose-Capillary: 195 mg/dL — ABNORMAL HIGH (ref 70–99)
Glucose-Capillary: 221 mg/dL — ABNORMAL HIGH (ref 70–99)
Glucose-Capillary: 257 mg/dL — ABNORMAL HIGH (ref 70–99)
Glucose-Capillary: 273 mg/dL — ABNORMAL HIGH (ref 70–99)

## 2011-07-29 SURGERY — ARTERIOVENOUS (AV) FISTULA CREATION
Anesthesia: Monitor Anesthesia Care | Site: Arm Upper | Laterality: Right | Wound class: Clean

## 2011-07-29 MED ORDER — OXYCODONE HCL 5 MG PO TABS: 5.0000 mg | ORAL_TABLET | Freq: Four times a day (QID) | ORAL | Status: AC | PRN

## 2011-07-29 MED ORDER — MIDAZOLAM HCL 5 MG/5ML IJ SOLN: INTRAMUSCULAR | Status: DC | PRN

## 2011-07-29 MED ORDER — SODIUM CHLORIDE 0.9 % IR SOLN
Status: DC | PRN
  Administered 2011-07-29: 11:00:00

## 2011-07-29 MED ORDER — VANCOMYCIN HCL 1000 MG IV SOLR
1000.0000 mg | INTRAVENOUS | Status: DC | PRN
  Administered 2011-07-29: 1000 mg via INTRAVENOUS

## 2011-07-29 MED ORDER — DROPERIDOL 2.5 MG/ML IJ SOLN
0.6250 mg | INTRAMUSCULAR | Status: DC | PRN
  Filled 2011-07-29: qty 0.25

## 2011-07-29 MED ORDER — 0.9 % SODIUM CHLORIDE (POUR BTL) OPTIME
TOPICAL | Status: DC | PRN
  Administered 2011-07-29: 1000 mL

## 2011-07-29 MED ORDER — HYDROMORPHONE HCL PF 1 MG/ML IJ SOLN: 0.2500 mg | INTRAMUSCULAR | Status: DC | PRN

## 2011-07-29 MED ORDER — VANCOMYCIN HCL IN DEXTROSE 1-5 GM/200ML-% IV SOLN
INTRAVENOUS | Status: AC
  Filled 2011-07-29: qty 200

## 2011-07-29 MED ORDER — SUFENTANIL CITRATE 50 MCG/ML IV SOLN
INTRAVENOUS | Status: DC | PRN
  Administered 2011-07-29 (×2): 5 ug via INTRAVENOUS
  Administered 2011-07-29: 10 ug via INTRAVENOUS

## 2011-07-29 MED ORDER — LIDOCAINE-EPINEPHRINE 0.5-1:200000 % IJ SOLN
INTRAMUSCULAR | Status: DC | PRN
  Administered 2011-07-29: 50 mL

## 2011-07-29 MED ORDER — PROPOFOL 10 MG/ML IV EMUL
INTRAVENOUS | Status: DC | PRN
  Administered 2011-07-29: 50 ug/kg/min via INTRAVENOUS

## 2011-07-29 SURGICAL SUPPLY — 36 items
ADH SKN CLS APL DERMABOND .7 (GAUZE/BANDAGES/DRESSINGS) ×2
CANISTER SUCTION 2500CC (MISCELLANEOUS) ×3 IMPLANT
CLIP TI MEDIUM 24 (CLIP) ×2 IMPLANT
CLIP TI MEDIUM 6 (CLIP) ×3 IMPLANT
CLIP TI WIDE RED SMALL 24 (CLIP) ×2 IMPLANT
CLIP TI WIDE RED SMALL 6 (CLIP) ×3 IMPLANT
CLOTH BEACON ORANGE TIMEOUT ST (SAFETY) ×3 IMPLANT
COVER SURGICAL LIGHT HANDLE (MISCELLANEOUS) ×6 IMPLANT
DECANTER SPIKE VIAL GLASS SM (MISCELLANEOUS) ×3 IMPLANT
DERMABOND ADVANCED (GAUZE/BANDAGES/DRESSINGS) ×1
DERMABOND ADVANCED .7 DNX12 (GAUZE/BANDAGES/DRESSINGS) ×2 IMPLANT
ELECT REM PT RETURN 9FT ADLT (ELECTROSURGICAL) ×3
ELECTRODE REM PT RTRN 9FT ADLT (ELECTROSURGICAL) ×2 IMPLANT
GEL ULTRASOUND 20GR AQUASONIC (MISCELLANEOUS) IMPLANT
GLOVE BIOGEL PI IND STRL 7.0 (GLOVE) ×2 IMPLANT
GLOVE BIOGEL PI INDICATOR 7.0 (GLOVE) ×2
GLOVE ECLIPSE 6.5 STRL STRAW (GLOVE) ×2 IMPLANT
GLOVE SS BIOGEL STRL SZ 7 (GLOVE) ×3 IMPLANT
GLOVE SUPERSENSE BIOGEL SZ 7 (GLOVE) ×2
GOWN PREVENTION PLUS XXLARGE (GOWN DISPOSABLE) ×2 IMPLANT
GOWN STRL NON-REIN LRG LVL3 (GOWN DISPOSABLE) ×6 IMPLANT
KIT BASIN OR (CUSTOM PROCEDURE TRAY) ×3 IMPLANT
KIT ROOM TURNOVER OR (KITS) ×3 IMPLANT
NS IRRIG 1000ML POUR BTL (IV SOLUTION) ×3 IMPLANT
PACK CV ACCESS (CUSTOM PROCEDURE TRAY) ×3 IMPLANT
PAD ARMBOARD 7.5X6 YLW CONV (MISCELLANEOUS) ×6 IMPLANT
SPONGE GAUZE 4X4 12PLY (GAUZE/BANDAGES/DRESSINGS) ×3 IMPLANT
SPONGE LAP 18X18 X RAY DECT (DISPOSABLE) ×2 IMPLANT
SUT PROLENE 6 0 BV (SUTURE) ×6 IMPLANT
SUT PROLENE 6 0 CC (SUTURE) ×4 IMPLANT
SUT VIC AB 3-0 SH 27 (SUTURE) ×6
SUT VIC AB 3-0 SH 27X BRD (SUTURE) ×4 IMPLANT
TOWEL OR 17X24 6PK STRL BLUE (TOWEL DISPOSABLE) ×3 IMPLANT
TOWEL OR 17X26 10 PK STRL BLUE (TOWEL DISPOSABLE) ×3 IMPLANT
UNDERPAD 30X30 INCONTINENT (UNDERPADS AND DIAPERS) ×3 IMPLANT
WATER STERILE IRR 1000ML POUR (IV SOLUTION) ×3 IMPLANT

## 2011-07-29 NOTE — Progress Notes (Signed)
PROGRESS NOTE  Dale [name] [id number] DOB: 1965/11/19 DOA: 07/22/2011 PCP: No primary provider on file. Nephrologist: Dr. Mercy Moore  Brief narrative: 46 year old man  to short stay for iron infusion. Was found to have a hemoglobin of 6.7 and referred to the emergency department. He was recently admitted to [facility] for heart failure and received blood at that time. Referred for anemia. Fever.  Patient has remained afebrile.  Past medical history: End-stage renal disease secondary to diabetic nephropathy, status post renal transplant 1995, refractory anemia (on Procrit and iron infusion as an outpatient), fibromyalgia  Consultants:  Nephrology  Vascular surgery  Procedures:  March 30: 2-D echocardiogram: Left ventricular ejection fraction 55-60%. Normal wall motion.  March 29: Transfused 2 units packed blood cells.  April 4: Right arm and central venogram  April 5: Creation of right brachial artery to cephalic vein AV fistula  Interim History: Interval documentation reviewed. Surgery completed.  Subjective: Feels nauseous.  Objective: Filed Vitals:   07/28/11 1426 07/28/11 1857 07/28/11 2140 07/29/11 0519  BP:  131/66 169/78 135/70  Pulse: 91 95 102 92  Temp:  100 F (37.8 C) 99.3 F (37.4 C) 99.1 F (37.3 C)  TempSrc:  Oral Oral Oral  Resp:  16 17 17   Height:      Weight:   120.8 kg (266 lb 5.1 oz)   SpO2:  90% 96% 93%    Intake/Output Summary (Last 24 hours) at 07/29/11 0834 Last data filed at 07/29/11 0534  Gross per 24 hour  Intake    240 ml  Output   1750 ml  Net  -1510 ml    Exam:   General: appears calm and mildly uncomfortable.  Cardiovascular:  Regular rate and rhythm. 1+ right lower extremity edema.  Respiratory: Clear to auscultation bilaterally. No wheezes, rales, rhonchi. Normal respiratory effort.   Psychiatric: Grossly normal mood and affect.  Data Reviewed: Basic Metabolic Panel:  Lab [id number] 0540 07/26/11 0515 07/25/11 0450  07/24/11 0647 07/23/11 0848  NA 132* 129* 130* 130* 132*  K 4.5 5.0 -- -- --  CL 99 94* 94* 94* 95*  CO2 20 21 22 22 23   GLUCOSE 242* 272* 221* 385* 71  BUN 124* 127* 124* 118* 111*  CREATININE 3.40* 3.86* 4.13* 4.24* 4.43*  CALCIUM 9.2 9.3 9.2 9.7 9.4  MG -- -- -- -- --  PHOS 7.0* -- -- -- --   Liver Function Tests:  Lab 07/28/11 0540 07/22/11 1457  AST -- 51*  ALT -- 57*  ALKPHOS -- 549*  BILITOT -- 0.6  PROT -- 7.4  ALBUMIN 1.5* 1.4*   CBC:  Lab 07/28/11 0540 07/27/11 0600 07/26/11 0515 07/23/11 0848 07/23/11 0700 07/22/11 1457  WBC 9.4 9.2 10.4 11.1* 11.4* --  NEUTROABS -- -- -- -- -- 8.5*  HGB 7.5* 7.5* 7.8* 8.3* 8.5* --  HCT 25.2* 24.6* 25.7* 27.2* 26.6* --  MCV 86.9 87.5 87.4 86.3 86.4 --  PLT 395 405* 466* 461* 465* --   Cardiac Enzymes:  Lab 07/23/11 0500 07/22/11 1942 07/22/11 1519  CKTOTAL 77 52 --  CKMB 1.5 1.4 --  CKMBINDEX -- -- --  TROPONINI <0.30 <0.30 <0.30   CBG:  Lab 07/29/11 0750 07/28/11 2145 07/28/11 1707 07/28/11 1157 07/28/11 0744  GLUCAP 221* 191* 190* 263* 257*    Recent Results (from the past 240 hour(s))  MRSA PCR SCREENING     Status: Normal   Collection Time   07/24/11  5:30 AM      Component  Value Range Status Comment   MRSA by PCR NEGATIVE  NEGATIVE  Final   CLOSTRIDIUM DIFFICILE BY PCR     Status: Normal   Collection Time   07/24/11  2:55 PM      Component Value Range Status Comment   C difficile by pcr NEGATIVE  NEGATIVE  Final      Studies: Dg Chest 2 View  07/22/2011  *RADIOLOGY REPORT*  Clinical Data: Fever for 1 week with cough, shortness of breath, hypertension and diabetes  CHEST - 2 VIEW  Comparison: 10/19/2004  Findings: Heart and mediastinal contours are stable with an unchanged degree of mild cardiomegaly.  The lung fields are notable for mild pulmonary vascular congestion.  No overt congestive failure, interstitial septal lines or significant peribronchial cuffing is seen to suggest early interstitial edema. A  slight increase in interstitial markings are seen diffusely and may indicate underlying viral pneumonitis.  No focal infiltrates are seen to suggest an area of focal bronchopneumonia.  No pleural fluid is seen.  Bony structures appear intact.  IMPRESSION: Mild pulmonary vascular congestion and mild increase in interstitial markings suspicious for viral pneumonitis given history of fever.  No findings suggestive of bronchopneumonia.  Original Report Authenticated By: Ander Gaster, M.D.   Scheduled Meds:    . benzonatate  100 mg Oral TID  . cycloSPORINE modified  100 mg Oral BID  . fentaNYL      . furosemide  80 mg Intravenous Once  . insulin aspart  0-5 Units Subcutaneous QHS  . insulin aspart  0-9 Units Subcutaneous TID WC  . insulin aspart  6 Units Subcutaneous TID WC  . insulin glargine  25 Units Subcutaneous QHS  . isosorbide mononitrate  60 mg Oral Daily  . labetalol  300 mg Oral BID  . levothyroxine  75 mcg Oral QAC breakfast  . metoCLOPramide  5 mg Oral TID PC & HS  . pantoprazole  40 mg Oral Q1200  . predniSONE  5 mg Oral Q breakfast  . rOPINIRole  1 mg Oral QHS  . sodium chloride  3 mL Intravenous Q12H   Continuous Infusions:    . sodium chloride 20 mL/hr at 07/28/11 1900     Assessment/Plan: 1. Acute/chronic renal failure: Slowly improving. Defer management to nephrology. Status post AV fistula creation today. 2. Refractory anemia: Stable. Fecal occult blood negative. Status post 2 units packed red blood cells. Aspirin on hold. Management per nephrology. 3. Fever: Resolved. Possible viral pneumonitis. Influenza negative.  4. Diabetes mellitus:  Lantus, sliding scale insulin. May resume pump per diabetic coordinator.  5. History of renal transplantation with allograft nephropathy: Per nephrology. Continue cyclosporin and prednisone. 6. Obstructive sleep apnea: Patient refuses CPAP.   Non-nephrology issues appear stable.  Code Status:  Full code  Family  Communication: none at bedside  Disposition Plan:  home when improved.    Murray Hodgkins, MD  [facility]  Pager [phone] 7 AM-7 PM  7 PM-7 AM please call night coverage  [email or web address]  Password [email or web address]  07/29/2011, 8:34 AM    LOS: 7 days

## 2011-07-29 NOTE — Anesthesia Postprocedure Evaluation (Signed)
Anesthesia Post Note  Patient: Dale [name]  Procedure(s) Performed: Procedure(s) (LRB): ARTERIOVENOUS (AV) FISTULA CREATION (Right)  Anesthesia type: MAC  Patient location: PACU  Post pain: Pain level controlled  Post assessment: Patient's Cardiovascular Status Stable  Last Vitals:  Filed Vitals:   07/29/11 1245  BP: 119/50  Pulse:   Temp: 37 C  Resp:     Post vital signs: Reviewed and stable  Level of consciousness: sedated  Complications: No apparent anesthesia complications

## 2011-07-29 NOTE — Transfer of Care (Signed)
Immediate Anesthesia Transfer of Care Note  Patient: Dale [name]  Procedure(s) Performed: Procedure(s) (LRB): ARTERIOVENOUS (AV) FISTULA CREATION (Right)  Patient Location: PACU  Anesthesia Type: MAC  Level of Consciousness: awake and alert   Airway & Oxygen Therapy: Patient Spontanous Breathing and Patient connected to nasal cannula oxygen  Post-op Assessment: Report given to PACU RN, Post -op Vital signs reviewed and stable and Patient moving all extremities  Post vital signs: Reviewed and stable  Complications: No apparent anesthesia complications

## 2011-07-29 NOTE — Interval H&P Note (Signed)
History and Physical Interval Note:  07/29/2011 7:41 AM  Dale [name]  has presented today for surgery, with the diagnosis of ESRD  The various methods of treatment have been discussed with the patient and family. After consideration of risks, benefits and other options for treatment, the patient has consented to  Procedure(s) (LRB): INSERTION OF ARTERIOVENOUS (AV) GORE-TEX GRAFT ARM (Right) as a surgical intervention .  The patients' history has been reviewed, patient examined, no change in status, stable for surgery.  I have reviewed the patients' chart and labs.  Questions were answered to the patient's satisfaction.     Burnette Sautter

## 2011-07-29 NOTE — Anesthesia Preprocedure Evaluation (Signed)
Anesthesia Evaluation  Patient identified by MRN, date of birth, ID band Patient awake    Reviewed: Allergy & Precautions, H&P , NPO status , Patient's Chart, lab work & pertinent test results  History of Anesthesia Complications Negative for: history of anesthetic complications  Airway Mallampati: III TM Distance: >3 FB Neck ROM: Full    Dental  (+) Teeth Intact and Dental Advisory Given   Pulmonary shortness of breath and with exertion, sleep apnea ,  breath sounds clear to auscultation        Cardiovascular +CHF Rhythm:Regular Rate:Normal - Systolic murmurs    Neuro/Psych    GI/Hepatic negative GI ROS, Neg liver ROS,   Endo/Other  Diabetes mellitus-, Insulin DependentMorbid obesity  Renal/GU ESRFRenal disease     Musculoskeletal   Abdominal   Peds  Hematology   Anesthesia Other Findings   Reproductive/Obstetrics                           Anesthesia Physical Anesthesia Plan  ASA: III  Anesthesia Plan: General   Post-op Pain Management:    Induction: Intravenous  Airway Management Planned: LMA  Additional Equipment:   Intra-op Plan:   Post-operative Plan: Extubation in OR  Informed Consent: I have reviewed the patients History and Physical, chart, labs and discussed the procedure including the risks, benefits and alternatives for the proposed anesthesia with the patient or authorized representative who has indicated his/her understanding and acceptance.   Dental advisory given  Plan Discussed with: CRNA, Anesthesiologist and Surgeon  Anesthesia Plan Comments:         Anesthesia Quick Evaluation

## 2011-07-29 NOTE — H&P (View-Only) (Signed)
Patient ID: Dale [name], male   DOB: [birth year], 46 y.o.   MRN: [id number] Reviewed right arm venogram and discussed with the patient. This does reveal patency of the central veins therefore he is a candidate for right arm access. I discussed options for AV fistula an AV graft. His cephalic vein is patent as is his basilic. We will imaged this with ultrasound and make determination as far as best right arm access in the operating room tomorrow. The patient understands and wishes to proceed

## 2011-07-29 NOTE — Op Note (Signed)
OPERATIVE REPORT  Date of Surgery: 07/22/2011 - 07/29/2011  Surgeon: Tinnie Gens, MD  Assistant: Gerri Lins PA  Pre-op Diagnosis: End-stage renal disease  Post-op Diagnosis: End-stage renal disease  Procedure: Procedure(s): Creation of right brachial artery to cephalic vein AV fistula  Anesthesia: MAC  EBL: Minimal  Complications: None  Procedure Details: Patient was taken to the operating room placed in the supine position at which time the right upper extremity was prepped with Betadine scrub and solution draped in routine sterile manner. After infiltration with 1% Xylocaine with epinephrine a transverse incision was made in the antecubital area. Antecubital vein was dissected free. Cephalic vein was very large probably 4 mm in size tear the basilic vein was preserved. Cephalic vein was ligated at its junction with the basilic with a 2-0 silk tie. The brachial artery was exposed beneath the fascia. It was a diffusely diseased vessel it had an excellent pulse. It was occluded proximally and distally with Vesseloops over the 15 blade extended with Potts scissors. The vein was carefully measured spatulated and anastomosed end to side with 6-0 Prolene. Vessel loops were released there was an excellent pulse and palpable thrill in the fistula area there was also good radial and ulnar arterial flow distally with the fistula open. Adequate hemostasis was achieved and wounds closed in layers with Vicryl in a subcuticular fashion. Sterile dressing applied patient taken to the recovery in stable condition   Tinnie Gens, MD 07/29/2011 12:07 PM

## 2011-07-29 NOTE — Progress Notes (Signed)
  [facility] Progress Note  Assessment/Plan:  1. S/p renal transplant with chronic allograft nephropathy and now some acute on chronic renal failure with prerenal azotemia. 2  S/P AVF RUE 3. S/p renal transplant- continue home doses of cyclosporine and prednisone. 4. S/p 50cc contrast on 4/4  4. Volume- will stop IVF.   Subjective:   Feels battered   Objective:   BP 121/67  Pulse 89  Temp(Src) 97.8 F (36.6 C) (Oral)  Resp 24  Ht 5\' 8"  (1.727 m)  Wt 120.8 kg (266 lb 5.1 oz)  BMI 40.49 kg/m2  SpO2 95%  Intake/Output Summary (Last 24 hours) at 07/29/11 1701 Last data filed at 07/29/11 1300  Gross per 24 hour  Intake    350 ml  Output   1950 ml  Net  -1600 ml   Weight change:   Physical Exam: Gen:ill appearing CVS:RRR Resp:clear [id number]  Imaging: No results found.  Labs: BMET  Lab 07/28/11 0540 07/26/11 0515 07/25/11 0450 07/24/11 0647 07/23/11 0848 07/23/11 0700  NA 132* 129* 130* 130* 132* 134*  K 4.5 5.0 4.9 4.8 4.7 4.3  CL 99 94* 94* 94* 95* 97  CO2 20 21 22 22 23 23   GLUCOSE 242* 272* 221* 385* 71 58*  BUN 124* 127* 124* 118* 111* 113*  CREATININE 3.40* 3.86* 4.13* 4.24* 4.43* 4.60*  ALB -- -- -- -- -- --  CALCIUM 9.2 9.3 9.2 9.7 9.4 9.6  PHOS 7.0* -- -- -- -- --   CBC  Lab 07/28/11 0540 07/27/11 0600 07/26/11 0515 07/23/11 0848  WBC 9.4 9.2 10.4 11.1*  NEUTROABS -- -- -- --  HGB 7.5* 7.5* 7.8* 8.3*  HCT 25.2* 24.6* 25.7* 27.2*  MCV 86.9 87.5 87.4 86.3  PLT 395 405* 466* 461*    Medications:      . benzonatate  100 mg Oral TID  . cycloSPORINE modified  100 mg Oral BID  . insulin aspart  0-5 Units Subcutaneous QHS  . insulin aspart  0-9 Units Subcutaneous TID WC  . insulin aspart  6 Units Subcutaneous TID WC  . insulin glargine  25 Units Subcutaneous QHS  . isosorbide mononitrate  60 mg Oral Daily  . labetalol  300 mg Oral BID  . levothyroxine  75 mcg Oral QAC breakfast  . metoCLOPramide  5 mg Oral TID PC  & HS  . pantoprazole  40 mg Oral Q1200  . predniSONE  5 mg Oral Q breakfast  . rOPINIRole  1 mg Oral QHS  . sodium chloride  3 mL Intravenous Q12H

## 2011-07-29 NOTE — Interval H&P Note (Signed)
History and Physical Interval Note:  07/29/2011 10:30 AM  Dale [name]  has presented today for surgery, with the diagnosis of ESRD  The various methods of treatment have been discussed with the patient and family. After consideration of risks, benefits and other options for treatment, the patient has consented to  Procedure(s) (LRB): INSERTION OF ARTERIOVENOUS (AV) GORE-TEX GRAFT ARM (Right) as a surgical intervention .  The patients' history has been reviewed, patient examined, no change in status, stable for surgery.  I have reviewed the patients' chart and labs.  Questions were answered to the patient's satisfaction.     Tinnie Gens

## 2011-07-30 LAB — CBC
HCT: 22.8 % — ABNORMAL LOW (ref 39.0–52.0)
Hemoglobin: 6.9 g/dL — CL (ref 13.0–17.0)
MCH: 27.2 pg (ref 26.0–34.0)
MCHC: 30.3 g/dL (ref 30.0–36.0)
MCV: 89.8 fL (ref 78.0–100.0)
Platelets: 408 10*3/uL — ABNORMAL HIGH (ref 150–400)
RBC: 2.54 MIL/uL — ABNORMAL LOW (ref 4.22–5.81)
RDW: 20 % — ABNORMAL HIGH (ref 11.5–15.5)
WBC: 10 10*3/uL (ref 4.0–10.5)

## 2011-07-30 LAB — RENAL FUNCTION PANEL
Albumin: 1.4 g/dL — ABNORMAL LOW (ref 3.5–5.2)
BUN: 129 mg/dL — ABNORMAL HIGH (ref 6–23)
CO2: 19 mEq/L (ref 19–32)
Calcium: 9 mg/dL (ref 8.4–10.5)
Chloride: 100 mEq/L (ref 96–112)
Creatinine, Ser: 3.87 mg/dL — ABNORMAL HIGH (ref 0.50–1.35)
GFR calc Af Amer: 20 mL/min — ABNORMAL LOW (ref >90)
GFR calc non Af Amer: 17 mL/min — ABNORMAL LOW (ref >90)
Glucose, Bld: 177 mg/dL — ABNORMAL HIGH (ref 70–99)
Phosphorus: 7.2 mg/dL — ABNORMAL HIGH (ref 2.3–4.6)
Potassium: 5.5 mEq/L — ABNORMAL HIGH (ref 3.5–5.1)
Sodium: 133 mEq/L — ABNORMAL LOW (ref 135–145)

## 2011-07-30 LAB — GLUCOSE, CAPILLARY
Glucose-Capillary: 228 mg/dL — ABNORMAL HIGH (ref 70–99)
Glucose-Capillary: 282 mg/dL — ABNORMAL HIGH (ref 70–99)
Glucose-Capillary: 318 mg/dL — ABNORMAL HIGH (ref 70–99)
Glucose-Capillary: 297 mg/dL — ABNORMAL HIGH (ref 70–99)

## 2011-07-30 LAB — PREPARE RBC (CROSSMATCH)

## 2011-07-30 MED ORDER — CALCIUM ACETATE 667 MG PO CAPS
667.0000 mg | ORAL_CAPSULE | Freq: Three times a day (TID) | ORAL | Status: DC
  Administered 2011-07-30 – 2011-08-02 (×6): 667 mg via ORAL
  Filled 2011-07-30 (×11): qty 1

## 2011-07-30 MED ORDER — SODIUM POLYSTYRENE SULFONATE 15 GM/60ML PO SUSP
15.0000 g | Freq: Once | ORAL | Status: AC
  Administered 2011-07-30: 15 g via ORAL
  Filled 2011-07-30: qty 60

## 2011-07-30 NOTE — Progress Notes (Signed)
[facility]  Daily Progress Note  Assessment/Planning: POD #1 s/p R BC AVF   No evidence of steal  Follow up in the office in 4 weeks with Dr. Kellie Simmering [phone])  Subjective  - 1 Day Post-Op  Pain tolerable  Objective Filed Vitals:   07/29/11 1404 07/29/11 1830 07/29/11 2115 07/30/11 0505  BP: 121/67 136/77 135/68 151/71  Pulse: 89 91 94 95  Temp: 97.8 F (36.6 C) 98.4 F (36.9 C) 99.8 F (37.7 C) 99.2 F (37.3 C)  TempSrc: Oral Oral Oral Oral  Resp: 24 22 20 22   Height:      Weight:   266 lb 12.1 oz (121 kg)   SpO2: 95% 96% 96% 99%    Intake/Output Summary (Last 24 hours) at 07/30/11 0914 Last data filed at 07/30/11 0500  Gross per 24 hour  Intake    350 ml  Output    750 ml  Net   -400 ml    VASC  Palpable thrill, +bruit in R upper arm, inc c/d/i, motor and sensation intact  Laboratory CBC    Component Value Date/Time   WBC 10.0 07/30/2011 0445   HGB 6.9* 07/30/2011 0445   HCT 22.8* 07/30/2011 0445   PLT 408* 07/30/2011 0445    BMET    Component Value Date/Time   NA 133* 07/30/2011 0430   K 5.5* 07/30/2011 0430   CL 100 07/30/2011 0430   CO2 19 07/30/2011 0430   GLUCOSE 177* 07/30/2011 0430   BUN 129* 07/30/2011 0430   CREATININE 3.87* 07/30/2011 0430   CALCIUM 9.0 07/30/2011 0430   GFRNONAA 17* 07/30/2011 0430   GFRAA 20* 07/30/2011 0430    [name], MD [facility] Office: [phone] Pager: [phone]  07/30/2011, 9:14 AM

## 2011-07-30 NOTE — Progress Notes (Signed)
  [facility] Progress Note  1. S/p renal transplant with chronic allograft nephropathy and now some acute on chronic renal failure with prerenal azotemia.  2 S/P AVF RUE  3. S/p renal transplant- continue home doses of cyclosporine and prednisone.  4. S/p 50cc contrast on 4/4  5 Anemia post op s/p PRBC 6 Hyperkalemia Rx Kayexylate   Subjective:   Wife concerned about pat being alone at home when she is at work   Objective:   BP 144/81  Pulse 92  Temp(Src) 97.9 F (36.6 C) (Oral)  Resp 22  Ht 5\' 8"  (1.727 m)  Wt 121 kg (266 lb 12.1 oz)  BMI 40.56 kg/m2  SpO2 99%  Intake/Output Summary (Last 24 hours) at 07/30/11 1329 Last data filed at 07/30/11 1210  Gross per 24 hour  Intake  862.5 ml  Output    750 ml  Net  112.5 ml   Weight change: 0.2 kg (7.1 oz)  Physical Exam: Gen:ill appearing CVS:RRR no rub Resp:clear [id number] Ext:1+ amputee  Imaging: No results found.  Labs: BMET  Lab 07/30/11 0430 07/28/11 0540 07/26/11 0515 07/25/11 0450 07/24/11 0647  NA 133* 132* 129* 130* 130*  K 5.5* 4.5 5.0 4.9 4.8  CL 100 99 94* 94* 94*  CO2 19 20 21 22 22   GLUCOSE 177* 242* 272* 221* 385*  BUN 129* 124* 127* 124* 118*  CREATININE 3.87* 3.40* 3.86* 4.13* 4.24*  ALB -- -- -- -- --  CALCIUM 9.0 9.2 9.3 9.2 9.7  PHOS 7.2* 7.0* -- -- --   CBC  Lab 07/30/11 0445 07/28/11 0540 07/27/11 0600 07/26/11 0515  WBC 10.0 9.4 9.2 10.4  NEUTROABS -- -- -- --  HGB 6.9* 7.5* 7.5* 7.8*  HCT 22.8* 25.2* 24.6* 25.7*  MCV 89.8 86.9 87.5 87.4  PLT 408* 395 405* 466*    Medications:      . benzonatate  100 mg Oral TID  . cycloSPORINE modified  100 mg Oral BID  . insulin aspart  0-5 Units Subcutaneous QHS  . insulin aspart  0-9 Units Subcutaneous TID WC  . insulin aspart  6 Units Subcutaneous TID WC  . insulin glargine  25 Units Subcutaneous QHS  . isosorbide mononitrate  60 mg Oral Daily  . labetalol  300 mg Oral BID  . levothyroxine  75 mcg Oral QAC breakfast   . metoCLOPramide  5 mg Oral TID PC & HS  . pantoprazole  40 mg Oral Q1200  . predniSONE  5 mg Oral Q breakfast  . rOPINIRole  1 mg Oral QHS  . sodium chloride  3 mL Intravenous Q12H  . sodium polystyrene  15 g Oral Once   07/30/2011, 1:29 PM

## 2011-07-30 NOTE — Progress Notes (Signed)
PROGRESS NOTE  Dale [name] [id number] DOB: 14-Aug-1965 DOA: 07/22/2011 PCP: No primary provider on file. Nephrologist: Dr. Mercy Moore  Brief narrative: 46 year old man  to short stay for iron infusion. Was found to have a hemoglobin of 6.7 and referred to the emergency department. He was recently admitted to [facility] for heart failure and received blood at that time. Referred for anemia. Fever.  Past medical history: End-stage renal disease secondary to diabetic nephropathy, status post renal transplant 1995, refractory anemia (on Procrit and iron infusion as an outpatient), fibromyalgia  Consultants:  Nephrology  Vascular surgery  Procedures:  March 30: 2-D echocardiogram: Left ventricular ejection fraction 55-60%. Normal wall motion.  March 29: Transfused 2 units packed blood cells.  April 4: Right arm and central venogram  April 5: Creation of right brachial artery to cephalic vein AV fistula  April 5: This is a one unit of packed red blood cells.  Interim History: Interval documentation reviewed. Hemoglobin 6.9 status post surgery. Potassium 5.5.  Subjective: Complains of pain in the arm at surgical site.  Objective: Filed Vitals:   07/29/11 2115 07/30/11 0505 07/30/11 0915 07/30/11 0951  BP: 135/68 151/71 150/80 134/62  Pulse: 94 95 95 93  Temp: 99.8 F (37.7 C) 99.2 F (37.3 C) 99.1 F (37.3 C) 98.6 F (37 C)  TempSrc: Oral Oral Oral Oral  Resp: 20 22 21 20   Height:      Weight: 121 kg (266 lb 12.1 oz)     SpO2: 96% 99%      Intake/Output Summary (Last 24 hours) at 07/30/11 1140 Last data filed at 07/30/11 0936  Gross per 24 hour  Intake  712.5 ml  Output    750 ml  Net  -37.5 ml    Exam:   General: appears calm and mildly uncomfortable.  Cardiovascular:  Regular rate and rhythm. 1+ right lower extremity edema.  Respiratory: Clear to auscultation bilaterally. No wheezes, rales, rhonchi. Normal respiratory effort.   Psychiatric: Grossly  normal mood and affect.  Data Reviewed: Basic Metabolic Panel:  Lab [id number] 0430 07/28/11 0540 07/26/11 0515 07/25/11 0450 07/24/11 0647  NA 133* 132* 129* 130* 130*  K 5.5* 4.5 -- -- --  CL 100 99 94* 94* 94*  CO2 19 20 21 22 22   GLUCOSE 177* 242* 272* 221* 385*  BUN 129* 124* 127* 124* 118*  CREATININE 3.87* 3.40* 3.86* 4.13* 4.24*  CALCIUM 9.0 9.2 9.3 9.2 9.7  MG -- -- -- -- --  PHOS 7.2* 7.0* -- -- --   Liver Function Tests:  Lab 07/30/11 0430 07/28/11 0540  AST -- --  ALT -- --  ALKPHOS -- --  BILITOT -- --  PROT -- --  ALBUMIN 1.4* 1.5*   CBC:  Lab 07/30/11 0445 07/28/11 0540 07/27/11 0600 07/26/11 0515  WBC 10.0 9.4 9.2 10.4  NEUTROABS -- -- -- --  HGB 6.9* 7.5* 7.5* 7.8*  HCT 22.8* 25.2* 24.6* 25.7*  MCV 89.8 86.9 87.5 87.4  PLT 408* 395 405* 466*   CBG:  Lab 07/30/11 0805 07/29/11 2332 07/29/11 2115 07/29/11 1709 07/29/11 1403  GLUCAP 228* 105* 121* 181* 257*    Recent Results (from the past 240 hour(s))  MRSA PCR SCREENING     Status: Normal   Collection Time   07/24/11  5:30 AM      Component Value Range Status Comment   MRSA by PCR NEGATIVE  NEGATIVE  Final   CLOSTRIDIUM DIFFICILE BY PCR     Status: Normal  Collection Time   07/24/11  2:55 PM      Component Value Range Status Comment   C difficile by pcr NEGATIVE  NEGATIVE  Final      Studies:  Scheduled Meds:    . benzonatate  100 mg Oral TID  . cycloSPORINE modified  100 mg Oral BID  . insulin aspart  0-5 Units Subcutaneous QHS  . insulin aspart  0-9 Units Subcutaneous TID WC  . insulin aspart  6 Units Subcutaneous TID WC  . insulin glargine  25 Units Subcutaneous QHS  . isosorbide mononitrate  60 mg Oral Daily  . labetalol  300 mg Oral BID  . levothyroxine  75 mcg Oral QAC breakfast  . metoCLOPramide  5 mg Oral TID PC & HS  . pantoprazole  40 mg Oral Q1200  . predniSONE  5 mg Oral Q breakfast  . rOPINIRole  1 mg Oral QHS  . sodium chloride  3 mL Intravenous Q12H   Continuous  Infusions:    . DISCONTD: sodium chloride 10 mL/hr at 07/29/11 1002     Assessment/Plan: 1. Acute/chronic renal failure: Slightly worse today. Defer management to nephrology. Status post AV fistula April 5. 2. Hyperkalemia: Kayexalate. Not on potassium supplementation or ACE-I/ARB. 3. Refractory anemia: Slightly worse postoperatively. Fecal occult blood negative. Status post total of 3 units (1 today) packed red blood cells. Aspirin on hold. Management per nephrology. 4. Fever: Resolved. Possible viral pneumonitis. Influenza negative.  5. Diabetes mellitus:  Lantus, sliding scale insulin. May resume pump per diabetic coordinator.  6. History of renal transplantation with allograft nephropathy: Per nephrology. Continue cyclosporin and prednisone. 7. Obstructive sleep apnea: Patient refuses CPAP.   Non-nephrology issues appear stable.  Follow up with Dr. Kellie Simmering in 4 weeks.  Code Status:  Full code  Family Communication: none at bedside  Disposition Plan:  home when improved.    Murray Hodgkins, MD  [facility]  Pager [phone] 7 AM-7 PM  7 PM-7 AM please call night coverage  [email or web address]  Password [email or web address]  07/30/2011, 11:40 AM    LOS: 8 days

## 2011-07-30 NOTE — Progress Notes (Signed)
Pt received one unit PRBC, no complications during transfusion, pt tolerated well.

## 2011-07-31 LAB — GLUCOSE, CAPILLARY
Glucose-Capillary: 316 mg/dL — ABNORMAL HIGH (ref 70–99)
Glucose-Capillary: 193 mg/dL — ABNORMAL HIGH (ref 70–99)
Glucose-Capillary: 201 mg/dL — ABNORMAL HIGH (ref 70–99)
Glucose-Capillary: 272 mg/dL — ABNORMAL HIGH (ref 70–99)

## 2011-07-31 LAB — CBC
HCT: 25 % — ABNORMAL LOW (ref 39.0–52.0)
Hemoglobin: 7.6 g/dL — ABNORMAL LOW (ref 13.0–17.0)
MCH: 27 pg (ref 26.0–34.0)
MCHC: 30.4 g/dL (ref 30.0–36.0)
MCV: 88.7 fL (ref 78.0–100.0)
Platelets: 399 10*3/uL (ref 150–400)
RBC: 2.82 MIL/uL — ABNORMAL LOW (ref 4.22–5.81)
RDW: 19.5 % — ABNORMAL HIGH (ref 11.5–15.5)
WBC: 9.8 10*3/uL (ref 4.0–10.5)

## 2011-07-31 LAB — TYPE AND SCREEN
ABO/RH(D): A POS
Antibody Screen: NEGATIVE
Unit division: 0

## 2011-07-31 LAB — BASIC METABOLIC PANEL
BUN: 136 mg/dL — ABNORMAL HIGH (ref 6–23)
BUN: 137 mg/dL — ABNORMAL HIGH (ref 6–23)
CO2: 17 mEq/L — ABNORMAL LOW (ref 19–32)
CO2: 20 mEq/L (ref 19–32)
Calcium: 9.1 mg/dL (ref 8.4–10.5)
Calcium: 9.4 mg/dL (ref 8.4–10.5)
Chloride: 95 mEq/L — ABNORMAL LOW (ref 96–112)
Chloride: 95 mEq/L — ABNORMAL LOW (ref 96–112)
Creatinine, Ser: 4.33 mg/dL — ABNORMAL HIGH (ref 0.50–1.35)
Creatinine, Ser: 4.74 mg/dL — ABNORMAL HIGH (ref 0.50–1.35)
GFR calc Af Amer: 18 mL/min — ABNORMAL LOW (ref >90)
GFR calc Af Amer: 16 mL/min — ABNORMAL LOW (ref >90)
GFR calc non Af Amer: 15 mL/min — ABNORMAL LOW (ref >90)
GFR calc non Af Amer: 14 mL/min — ABNORMAL LOW (ref >90)
Glucose, Bld: 322 mg/dL — ABNORMAL HIGH (ref 70–99)
Glucose, Bld: 183 mg/dL — ABNORMAL HIGH (ref 70–99)
Potassium: 5.7 mEq/L — ABNORMAL HIGH (ref 3.5–5.1)
Potassium: 5.6 mEq/L — ABNORMAL HIGH (ref 3.5–5.1)
Sodium: 127 mEq/L — ABNORMAL LOW (ref 135–145)
Sodium: 130 mEq/L — ABNORMAL LOW (ref 135–145)

## 2011-07-31 MED ORDER — INSULIN GLARGINE 100 UNIT/ML ~~LOC~~ SOLN
30.0000 [IU] | Freq: Every day | SUBCUTANEOUS | Status: DC
  Administered 2011-07-31 – 2011-08-02 (×3): 30 [IU] via SUBCUTANEOUS

## 2011-07-31 MED ORDER — SODIUM POLYSTYRENE SULFONATE 15 GM/60ML PO SUSP
30.0000 g | Freq: Once | ORAL | Status: AC
  Administered 2011-07-31: 30 g via ORAL
  Filled 2011-07-31: qty 120

## 2011-07-31 MED ORDER — VANCOMYCIN HCL IN DEXTROSE 1-5 GM/200ML-% IV SOLN
1000.0000 mg | INTRAVENOUS | Status: DC
  Filled 2011-07-31: qty 200

## 2011-07-31 MED ORDER — RENA-VITE PO TABS
1.0000 | ORAL_TABLET | Freq: Every day | ORAL | Status: DC
  Administered 2011-08-01 – 2011-08-13 (×13): 1 via ORAL
  Filled 2011-07-31 (×13): qty 1

## 2011-07-31 MED ORDER — SODIUM POLYSTYRENE SULFONATE 15 GM/60ML PO SUSP
30.0000 g | Freq: Once | ORAL | Status: AC
  Administered 2011-07-31: 30 g via ORAL
  Filled 2011-07-31 (×2): qty 60

## 2011-07-31 MED ORDER — HEPARIN SODIUM (PORCINE) 1000 UNIT/ML DIALYSIS
20.0000 [IU]/kg | INTRAMUSCULAR | Status: DC | PRN
  Filled 2011-07-31: qty 3

## 2011-07-31 NOTE — Progress Notes (Signed)
PROGRESS NOTE  Dale [name] [id number] DOB: 1965-10-17 DOA: 07/22/2011 PCP: No primary provider on file. Nephrologist: Dr. Mercy Moore  Brief narrative: 46 year old man  to short stay for iron infusion. Was found to have a hemoglobin of 6.7 and referred to the emergency department. He was recently admitted to [facility] for heart failure and received blood at that time. Referred for anemia. Fever.  Past medical history: End-stage renal disease secondary to diabetic nephropathy, status post renal transplant 1995, refractory anemia (on Procrit and iron infusion as an outpatient), fibromyalgia  Consultants:  Nephrology  Vascular surgery  Procedures:  March 30: 2-D echocardiogram: Left ventricular ejection fraction 55-60%. Normal wall motion.  March 29: Transfused 2 units packed blood cells.  April 4: Right arm and central venogram  April 5: Creation of right brachial artery to cephalic vein AV fistula  April 5: Transfusion one unit packed red blood cells.  Interim History: Interval documentation reviewed. Afebrile, vital signs stable. Hemoglobin 7.6 status post transfusion. Potassium 5.7.  Subjective: Poor sleep. Right arm less sore.  Objective: Filed Vitals:   07/30/11 1801 07/30/11 2230 07/31/11 0517 07/31/11 0909  BP: 121/62 124/68 129/55 121/60  Pulse: 84 84 87 87  Temp: 97.6 F (36.4 C) 97.9 F (36.6 C) 97.8 F (36.6 C) 97.8 F (36.6 C)  TempSrc: Oral Oral Oral Oral  Resp: 23 19 20 19   Height:      Weight:      SpO2: 98% 98% 98% 100%    Intake/Output Summary (Last 24 hours) at 07/31/11 1120 Last data filed at 07/31/11 0917  Gross per 24 hour  Intake   1894 ml  Output    375 ml  Net   1519 ml    Exam:   General: appears calm and mildly uncomfortable.  Cardiovascular:  Regular rate and rhythm. 1+ right lower extremity edema.  Respiratory: Clear to auscultation bilaterally. No wheezes, rales, rhonchi. Normal respiratory effort.   Psychiatric: Grossly  normal mood and affect.  Data Reviewed: Basic Metabolic Panel:  Lab [id number] 07/30/11 0430 07/28/11 0540 07/26/11 0515 07/25/11 0450  NA 127* 133* 132* 129* 130*  K 5.7* 5.5* -- -- --  CL 95* 100 99 94* 94*  CO2 17* 19 20 21 22   GLUCOSE 322* 177* 242* 272* 221*  BUN 136* 129* 124* 127* 124*  CREATININE 4.33* 3.87* 3.40* 3.86* 4.13*  CALCIUM 9.1 9.0 9.2 9.3 9.2  MG -- -- -- -- --  PHOS -- 7.2* 7.0* -- --   Liver Function Tests:  Lab 07/30/11 0430 07/28/11 0540  AST -- --  ALT -- --  ALKPHOS -- --  BILITOT -- --  PROT -- --  ALBUMIN 1.4* 1.5*   CBC:  Lab 07/31/11 0457 07/30/11 0445 07/28/11 0540 07/27/11 0600 07/26/11 0515  WBC 9.8 10.0 9.4 9.2 10.4  NEUTROABS -- -- -- -- --  HGB 7.6* 6.9* 7.5* 7.5* 7.8*  HCT 25.0* 22.8* 25.2* 24.6* 25.7*  MCV 88.7 89.8 86.9 87.5 87.4  PLT 399 408* 395 405* 466*   CBG:  Lab 07/31/11 0757 07/30/11 2152 07/30/11 1649 07/30/11 1149 07/30/11 0805  GLUCAP 316* 297* 318* 282* 228*    Recent Results (from the past 240 hour(s))  MRSA PCR SCREENING     Status: Normal   Collection Time   07/24/11  5:30 AM      Component Value Range Status Comment   MRSA by PCR NEGATIVE  NEGATIVE  Final   CLOSTRIDIUM DIFFICILE BY PCR  Status: Normal   Collection Time   07/24/11  2:55 PM      Component Value Range Status Comment   C difficile by pcr NEGATIVE  NEGATIVE  Final      Studies:  Scheduled Meds:    . benzonatate  100 mg Oral TID  . calcium acetate  667 mg Oral TID WC  . cycloSPORINE modified  100 mg Oral BID  . insulin aspart  0-5 Units Subcutaneous QHS  . insulin aspart  0-9 Units Subcutaneous TID WC  . insulin aspart  6 Units Subcutaneous TID WC  . insulin glargine  25 Units Subcutaneous QHS  . isosorbide mononitrate  60 mg Oral Daily  . labetalol  300 mg Oral BID  . levothyroxine  75 mcg Oral QAC breakfast  . metoCLOPramide  5 mg Oral TID PC & HS  . pantoprazole  40 mg Oral Q1200  . predniSONE  5 mg Oral Q breakfast  .  rOPINIRole  1 mg Oral QHS  . sodium chloride  3 mL Intravenous Q12H  . sodium polystyrene  15 g Oral Once  . sodium polystyrene  30 g Oral Once   Continuous Infusions:     Assessment/Plan: 1. Acute/chronic renal failure: Worse today. Defer management to nephrology. Status post AV fistula April 5. 2. Hyperkalemia: Recurrent, secondary to acute renal failure. Repeat Kayexalate. Not on potassium supplementation, heparin products or ACE-I/ARB. Telemetry: Sinus rhythm. No arrhythmias. 3. Refractory anemia: Improved status post 1 unit packed red blood cells.. Fecal occult blood negative. Status post total of 3 units packed red blood cells since admission. Aspirin remains on hold. Management per nephrology (followed and managed as an outpatient by Dr. Mercy Moore). 4. Fever: Resolved. Possible viral pneumonitis. Influenza negative.  5. Diabetes mellitus:  Worse control today. Increase Lantus, continue sliding scale insulin. May resume pump per diabetic coordinator.  6. History of renal transplantation with allograft nephropathy: Per nephrology. Continue cyclosporin and prednisone. 7. Obstructive sleep apnea: Patient refuses CPAP.  Follow up with Dr. Kellie Simmering in 4 weeks (approximately first week in May).  Code Status:  Full code  Family Communication: none at bedside  Disposition Plan:  home when improved.    Murray Hodgkins, MD  [facility]  Pager [phone] 7 AM-7 PM  7 PM-7 AM please call night coverage  [email or web address]  Password [email or web address]  07/31/2011, 11:20 AM    LOS: 9 days

## 2011-07-31 NOTE — Progress Notes (Signed)
07-31-11 1200 RN spoke to pt's wife and she had concerns for when he returns home. Wife works during the day and pt is home alone. Pt's father will be able to stop by, but wife wanted to see if pt is eligible for home health services. Thanks.

## 2011-07-31 NOTE — Progress Notes (Signed)
[facility]  Per my discussion with Dr. Florene Glen, will schedule patient for tunneled dialysis catheter tomorrow.  [name], MD [facility] [place] Office: [phone] Pager: [phone]  07/31/2011, 1:56 PM

## 2011-07-31 NOTE — Progress Notes (Signed)
  [facility] Progress Note   1. S/p renal transplant with chronic allograft nephropathy and now some acute on chronic renal failure with uremic syndrome 2 S/P AVF RUE  3. S/p renal transplant- continue home doses of cyclosporine and prednisone.  4. S/p 50cc contrast on 4/4  5 Anemia post op s/p PRBC  6 Hyperkalemia  Rx Kayexylate, Place PC and initiate HD Monday (Dr. Bridgett Larsson contacted)  Subjective:   Gen malaise, nocturnal nausea   Objective:   BP 121/60  Pulse 87  Temp(Src) 97.8 F (36.6 C) (Oral)  Resp 19  Ht 5\' 8"  (1.727 m)  Wt 121 kg (266 lb 12.1 oz)  BMI 40.56 kg/m2  SpO2 100%  Intake/Output Summary (Last 24 hours) at 07/31/11 1315 Last data filed at 07/31/11 1300  Gross per 24 hour  Intake   1424 ml  Output    376 ml  Net   1048 ml   Weight change:   Physical Exam: [id number] CVS:RRR no rub Resp:clear [id number] Ext:lle amputee, 2+ edema on Right Pos Asterixis  Imaging: No results found.  Labs: BMET  Lab 07/31/11 0457 07/30/11 0430 07/28/11 0540 07/26/11 0515 07/25/11 0450  NA 127* 133* 132* 129* 130*  K 5.7* 5.5* 4.5 5.0 4.9  CL 95* 100 99 94* 94*  CO2 17* 19 20 21 22   GLUCOSE 322* 177* 242* 272* 221*  BUN 136* 129* 124* 127* 124*  CREATININE 4.33* 3.87* 3.40* 3.86* 4.13*  ALB -- -- -- -- --  CALCIUM 9.1 9.0 9.2 9.3 9.2  PHOS -- 7.2* 7.0* -- --   CBC  Lab 07/31/11 0457 07/30/11 0445 07/28/11 0540 07/27/11 0600  WBC 9.8 10.0 9.4 9.2  NEUTROABS -- -- -- --  HGB 7.6* 6.9* 7.5* 7.5*  HCT 25.0* 22.8* 25.2* 24.6*  MCV 88.7 89.8 86.9 87.5  PLT 399 408* 395 405*    Medications:      . benzonatate  100 mg Oral TID  . calcium acetate  667 mg Oral TID WC  . cycloSPORINE modified  100 mg Oral BID  . insulin aspart  0-5 Units Subcutaneous QHS  . insulin aspart  0-9 Units Subcutaneous TID WC  . insulin aspart  6 Units Subcutaneous TID WC  . insulin glargine  30 Units Subcutaneous QHS  . isosorbide mononitrate  60 mg Oral Daily  .  labetalol  300 mg Oral BID  . levothyroxine  75 mcg Oral QAC breakfast  . metoCLOPramide  5 mg Oral TID PC & HS  . pantoprazole  40 mg Oral Q1200  . predniSONE  5 mg Oral Q breakfast  . rOPINIRole  1 mg Oral QHS  . sodium chloride  3 mL Intravenous Q12H  . sodium polystyrene  15 g Oral Once  . sodium polystyrene  30 g Oral Once  . sodium polystyrene  30 g Oral Once  . DISCONTD: insulin glargine  25 Units Subcutaneous QHS

## 2011-08-01 ENCOUNTER — Inpatient Hospital Stay (HOSPITAL_COMMUNITY): Payer: Medicare Other | Admitting: Anesthesiology

## 2011-08-01 ENCOUNTER — Inpatient Hospital Stay (HOSPITAL_COMMUNITY): Payer: Medicare Other

## 2011-08-01 ENCOUNTER — Encounter (HOSPITAL_COMMUNITY): Payer: Self-pay | Admitting: Anesthesiology

## 2011-08-01 ENCOUNTER — Encounter (HOSPITAL_COMMUNITY)
Admission: EM | Disposition: A | Discharge status: Home Health | Payer: Self-pay | Source: Home / Self Care | Attending: Internal Medicine

## 2011-08-01 ENCOUNTER — Encounter (HOSPITAL_COMMUNITY): Payer: Self-pay | Admitting: Vascular Surgery

## 2011-08-01 DIAGNOSIS — N186 End stage renal disease: Secondary | ICD-10-CM

## 2011-08-01 HISTORY — PX: INSERTION OF DIALYSIS CATHETER: SHX1324

## 2011-08-01 LAB — GLUCOSE, CAPILLARY
Glucose-Capillary: 289 mg/dL — ABNORMAL HIGH (ref 70–99)
Glucose-Capillary: 285 mg/dL — ABNORMAL HIGH (ref 70–99)
Glucose-Capillary: 292 mg/dL — ABNORMAL HIGH (ref 70–99)
Glucose-Capillary: 223 mg/dL — ABNORMAL HIGH (ref 70–99)

## 2011-08-01 LAB — BASIC METABOLIC PANEL
BUN: 139 mg/dL — ABNORMAL HIGH (ref 6–23)
BUN: 137 mg/dL — ABNORMAL HIGH (ref 6–23)
CO2: 20 mEq/L (ref 19–32)
CO2: 20 mEq/L (ref 19–32)
Calcium: 8.9 mg/dL (ref 8.4–10.5)
Calcium: 9.2 mg/dL (ref 8.4–10.5)
Chloride: 94 mEq/L — ABNORMAL LOW (ref 96–112)
Chloride: 96 mEq/L (ref 96–112)
Creatinine, Ser: 4.81 mg/dL — ABNORMAL HIGH (ref 0.50–1.35)
Creatinine, Ser: 4.59 mg/dL — ABNORMAL HIGH (ref 0.50–1.35)
GFR calc Af Amer: 15 mL/min — ABNORMAL LOW (ref >90)
GFR calc Af Amer: 16 mL/min — ABNORMAL LOW (ref >90)
GFR calc non Af Amer: 13 mL/min — ABNORMAL LOW (ref >90)
GFR calc non Af Amer: 14 mL/min — ABNORMAL LOW (ref >90)
Glucose, Bld: 289 mg/dL — ABNORMAL HIGH (ref 70–99)
Glucose, Bld: 173 mg/dL — ABNORMAL HIGH (ref 70–99)
Potassium: 4.5 mEq/L (ref 3.5–5.1)
Potassium: 4.8 mEq/L (ref 3.5–5.1)
Sodium: 129 mEq/L — ABNORMAL LOW (ref 135–145)
Sodium: 133 mEq/L — ABNORMAL LOW (ref 135–145)

## 2011-08-01 LAB — CBC
HCT: 24.5 % — ABNORMAL LOW (ref 39.0–52.0)
Hemoglobin: 7.5 g/dL — ABNORMAL LOW (ref 13.0–17.0)
MCH: 27.2 pg (ref 26.0–34.0)
MCHC: 30.6 g/dL (ref 30.0–36.0)
MCV: 88.8 fL (ref 78.0–100.0)
Platelets: 403 10*3/uL — ABNORMAL HIGH (ref 150–400)
RBC: 2.76 MIL/uL — ABNORMAL LOW (ref 4.22–5.81)
RDW: 19.6 % — ABNORMAL HIGH (ref 11.5–15.5)
WBC: 10.1 10*3/uL (ref 4.0–10.5)

## 2011-08-01 LAB — HEPATITIS B SURFACE ANTIBODY,QUALITATIVE: Hep B S Ab: POSITIVE — AB

## 2011-08-01 LAB — HEPATITIS B SURFACE ANTIGEN: Hepatitis B Surface Ag: NEGATIVE

## 2011-08-01 LAB — PHOSPHORUS: Phosphorus: 8.5 mg/dL — ABNORMAL HIGH (ref 2.3–4.6)

## 2011-08-01 SURGERY — INSERTION OF DIALYSIS CATHETER
Anesthesia: Monitor Anesthesia Care | Laterality: Right | Wound class: Clean

## 2011-08-01 MED ORDER — SODIUM CHLORIDE 0.9 % IV SOLN
INTRAVENOUS | Status: DC | PRN
  Administered 2011-08-01: 13:00:00 via INTRAVENOUS

## 2011-08-01 MED ORDER — FENTANYL CITRATE 0.05 MG/ML IJ SOLN
INTRAMUSCULAR | Status: DC | PRN
  Administered 2011-08-01: 25 ug via INTRAVENOUS
  Administered 2011-08-01: 50 ug via INTRAVENOUS

## 2011-08-01 MED ORDER — HEPARIN SODIUM (PORCINE) 1000 UNIT/ML IJ SOLN
INTRAMUSCULAR | Status: DC | PRN
  Administered 2011-08-01: 5 mL

## 2011-08-01 MED ORDER — HYDROMORPHONE HCL PF 1 MG/ML IJ SOLN: 0.2500 mg | INTRAMUSCULAR | Status: DC | PRN

## 2011-08-01 MED ORDER — 0.9 % SODIUM CHLORIDE (POUR BTL) OPTIME
TOPICAL | Status: DC | PRN
  Administered 2011-08-01: 1000 mL

## 2011-08-01 MED ORDER — ONDANSETRON HCL 4 MG/2ML IJ SOLN: 4.0000 mg | Freq: Once | INTRAMUSCULAR | Status: DC | PRN

## 2011-08-01 MED ORDER — SODIUM CHLORIDE 0.9 % IR SOLN
Status: DC | PRN
  Administered 2011-08-01: 13:00:00

## 2011-08-01 MED ORDER — LIDOCAINE-EPINEPHRINE 0.5-1:200000 % IJ SOLN
INTRAMUSCULAR | Status: DC | PRN
  Administered 2011-08-01: 9 mL

## 2011-08-01 MED ORDER — MIDAZOLAM HCL 5 MG/5ML IJ SOLN
INTRAMUSCULAR | Status: DC | PRN
  Administered 2011-08-01: 2 mg via INTRAVENOUS

## 2011-08-01 MED ORDER — ACETAMINOPHEN 325 MG PO TABS
ORAL_TABLET | ORAL | Status: AC
  Filled 2011-08-01: qty 2

## 2011-08-01 SURGICAL SUPPLY — 47 items
BAG DECANTER FOR FLEXI CONT (MISCELLANEOUS) ×2 IMPLANT
CATH CANNON HEMO 15F 50CM (CATHETERS) IMPLANT
CATH CANNON HEMO 15FR 19 (HEMODIALYSIS SUPPLIES) IMPLANT
CATH CANNON HEMO 15FR 23CM (HEMODIALYSIS SUPPLIES) IMPLANT
CATH CANNON HEMO 15FR 31CM (HEMODIALYSIS SUPPLIES) IMPLANT
CATH CANNON HEMO 15FR 32 (HEMODIALYSIS SUPPLIES) IMPLANT
CATH CANNON HEMO 15FR 32CM (HEMODIALYSIS SUPPLIES) ×2 IMPLANT
CHLORAPREP W/TINT 26ML (MISCELLANEOUS) ×1 IMPLANT
CLOTH BEACON ORANGE TIMEOUT ST (SAFETY) ×2 IMPLANT
COVER PROBE W GEL 5X96 (DRAPES) ×1 IMPLANT
COVER SURGICAL LIGHT HANDLE (MISCELLANEOUS) ×2 IMPLANT
DECANTER SPIKE VIAL GLASS SM (MISCELLANEOUS) ×2 IMPLANT
DRAPE C-ARM 42X72 X-RAY (DRAPES) ×2 IMPLANT
DRAPE CHEST BREAST 15X10 FENES (DRAPES) ×2 IMPLANT
GAUZE SPONGE 2X2 8PLY STRL LF (GAUZE/BANDAGES/DRESSINGS) ×1 IMPLANT
GAUZE SPONGE 4X4 16PLY XRAY LF (GAUZE/BANDAGES/DRESSINGS) ×2 IMPLANT
GLOVE BIO SURGEON STRL SZ7.5 (GLOVE) ×1 IMPLANT
GLOVE BIOGEL PI IND STRL 6.5 (GLOVE) IMPLANT
GLOVE BIOGEL PI IND STRL 7.0 (GLOVE) IMPLANT
GLOVE BIOGEL PI INDICATOR 6.5 (GLOVE) ×1
GLOVE BIOGEL PI INDICATOR 7.0 (GLOVE) ×1
GLOVE ECLIPSE 6.5 STRL STRAW (GLOVE) ×1 IMPLANT
GLOVE SS BIOGEL STRL SZ 7.5 (GLOVE) IMPLANT
GLOVE SUPERSENSE BIOGEL SZ 7.5 (GLOVE) ×1
GOWN PREVENTION PLUS XLARGE (GOWN DISPOSABLE) ×2 IMPLANT
GOWN STRL NON-REIN LRG LVL3 (GOWN DISPOSABLE) ×4 IMPLANT
KIT BASIN OR (CUSTOM PROCEDURE TRAY) ×2 IMPLANT
KIT ROOM TURNOVER OR (KITS) ×2 IMPLANT
NDL 18GX1X1/2 (RX/OR ONLY) (NEEDLE) ×1 IMPLANT
NDL HYPO 25GX1X1/2 BEV (NEEDLE) ×1 IMPLANT
NEEDLE 18GX1X1/2 (RX/OR ONLY) (NEEDLE) ×2 IMPLANT
NEEDLE HYPO 25GX1X1/2 BEV (NEEDLE) ×2 IMPLANT
NS IRRIG 1000ML POUR BTL (IV SOLUTION) ×2 IMPLANT
PACK SURGICAL SETUP 50X90 (CUSTOM PROCEDURE TRAY) ×2 IMPLANT
PAD ARMBOARD 7.5X6 YLW CONV (MISCELLANEOUS) ×4 IMPLANT
SPONGE GAUZE 2X2 STER 10/PKG (GAUZE/BANDAGES/DRESSINGS) ×1
SUT ETHILON 3 0 PS 1 (SUTURE) ×2 IMPLANT
SUT VICRYL 4-0 PS2 18IN ABS (SUTURE) ×2 IMPLANT
SYR 20CC LL (SYRINGE) ×4 IMPLANT
SYR 30ML LL (SYRINGE) IMPLANT
SYR 5ML LL (SYRINGE) ×4 IMPLANT
SYR CONTROL 10ML LL (SYRINGE) ×2 IMPLANT
SYRINGE 10CC LL (SYRINGE) ×2 IMPLANT
TAPE CLOTH SURG 4X10 WHT LF (GAUZE/BANDAGES/DRESSINGS) ×1 IMPLANT
TOWEL OR 17X24 6PK STRL BLUE (TOWEL DISPOSABLE) ×2 IMPLANT
TOWEL OR 17X26 10 PK STRL BLUE (TOWEL DISPOSABLE) ×2 IMPLANT
WATER STERILE IRR 1000ML POUR (IV SOLUTION) ×2 IMPLANT

## 2011-08-01 NOTE — Progress Notes (Signed)
Received e-mail  from pt.'s wife who located [place] via web site. Wife of patient requested visit and information regarding Advance directives and Living Will.  I spoke with Unit Social Worker,Unit Chaplain and pt.'s nurse  to facilitate information sharing and time to have documentation completed.  Unit Chaplain and I met and provided spiritual and emotional support to Pt. And wife. SW plans to meet with both on 08/02/11 to prepare forms.  Will follow as needed.   08/01/11 1200  Clinical Encounter Type  Visited With Patient and family together;Health care provider  Visit Type Pre-op (Wanted Advance Directives)  Referral From (website)  Spiritual Encounters  Spiritual Needs Emotional  Stress Factors  Patient Stress Factors None identified  Family Stress Factors None identified  Advance Directives (For Healthcare)  Advance Directive Patient does not have advance directive;Patient would like information  Patient requests advance directive information Advance directive packet given;Referral made to spiritual care;Referral made to social work

## 2011-08-01 NOTE — Interval H&P Note (Signed)
History and Physical Interval Note:  08/01/2011 12:21 PM  Dale [name]  has presented today for surgery, with the diagnosis of end stage renal disease  The various methods of treatment have been discussed with the patient and family. After consideration of risks, benefits and other options for treatment, the patient has consented to  Procedure(s) (LRB): INSERTION OF DIALYSIS CATHETER (N/A) as a surgical intervention .  The patients' history has been reviewed, patient examined, no change in status, stable for surgery.  I have reviewed the patients' chart and labs.  Questions were answered to the patient's satisfaction.     Alvina Strother

## 2011-08-01 NOTE — H&P (View-Only) (Signed)
[facility]  Per my discussion with Dr. Florene Glen, will schedule patient for tunneled dialysis catheter tomorrow.  [name], MD [facility] [place] Office: [phone] Pager: [phone]  07/31/2011, 1:56 PM

## 2011-08-01 NOTE — Progress Notes (Signed)
Dale [name]  Subjective:  Awake in PACU--just out of OR   Objective: Vital signs in last 24 hours: Blood pressure 148/61, pulse 89, temperature 97.3 F (36.3 C), temperature source Oral, resp. rate 24, height 5\' 8"  (1.727 m), weight 123.832 kg (273 lb), SpO2 100.00%.    PHYSICAL EXAM General--awake, alert Chest--new tunneled cath R IJ, clear Heart--no rub Abd--nontender Extr--L BKA, R upper arm AVF (39 days old), edema R LE  Lab Results:   Lab 08/01/11 0455 07/31/11 1500 07/31/11 0457 07/30/11 0430 07/28/11 0540  NA 129* 130* 127* -- --  K 4.5 5.6* 5.7* -- --  CL 94* 95* 95* -- --  CO2 20 20 17* -- --  BUN 139* 137* 136* -- --  CREATININE 4.81* 4.74* 4.33* -- --  ALB -- -- -- -- --  GLUCOSE 289* -- -- -- --  CALCIUM 8.9 9.4 9.1 -- --  PHOS -- -- -- 7.2* 7.0*     Basename 08/01/11 0455 07/31/11 0457  WBC 10.1 9.8  HGB 7.5* 7.6*  HCT 24.5* 25.0*  PLT 403* 399    Assessment/Plan: 1.  Failing renal transplant--continue anti-rejection meds (cyclosporine & prednisone) 2.  Fluid overload--HD today 3.  CKD-- begin HD today via cath 4.  DM-continue insulin, CBG  5.  Secondary PTH--check PTH and phosphorus in HD today 6.  Anemia--check Fe studies in HD today   LOS: 10 days   Dale [name] F 08/01/2011,3:21 PM   .labalb

## 2011-08-01 NOTE — Anesthesia Preprocedure Evaluation (Addendum)
Anesthesia Evaluation  Patient identified by MRN, date of birth, ID band Patient awake    Reviewed: Allergy & Precautions, H&P , NPO status , Patient's Chart, lab work & pertinent test results  Airway       Dental   Pulmonary shortness of breath, sleep apnea ,          Cardiovascular hypertension, +CHF     Neuro/Psych    GI/Hepatic   Endo/Other  Diabetes mellitus-, Type 2, Insulin DependentHypothyroidism   Renal/GU Dialysis, ESRF and CRFRenal disease     Musculoskeletal   Abdominal   Peds  Hematology   Anesthesia Other Findings   Reproductive/Obstetrics                          Anesthesia Physical Anesthesia Plan  ASA: III  Anesthesia Plan: MAC and General   Post-op Pain Management:    Induction: Intravenous  Airway Management Planned: Mask, LMA and Oral ETT  Additional Equipment:   Intra-op Plan:   Post-operative Plan: Extubation in OR  Informed Consent: I have reviewed the patients History and Physical, chart, labs and discussed the procedure including the risks, benefits and alternatives for the proposed anesthesia with the patient or authorized representative who has indicated his/her understanding and acceptance.     Plan Discussed with: CRNA, Anesthesiologist and Surgeon  Anesthesia Plan Comments:         Anesthesia Quick Evaluation

## 2011-08-01 NOTE — Progress Notes (Signed)
Accompanied Chaplain Ray W.  in response to pt request for Advance Directive information.  Introduced myself as Clinical biochemist for the unit and offered support.  Will continue to follow. Please page if I am needed or requested. Hope Pigeon  [phone] personal pager   [phone]  On-call pager

## 2011-08-01 NOTE — Progress Notes (Addendum)
PROGRESS NOTE  Dale [name] [id number] DOB: 02/17/1966 DOA: 07/22/2011 PCP: No primary provider on file. Nephrologist: Dr. Mercy Moore  Brief narrative: 46 year old man  to short stay for iron infusion. Was found to have a hemoglobin of 6.7 and referred to the emergency department. He was recently admitted to [facility] for heart failure and received blood at that time. Referred for anemia. Fever.  Past medical history: End-stage renal disease secondary to diabetic nephropathy, status post renal transplant 1995, refractory anemia (on Procrit and iron infusion as an outpatient), fibromyalgia  Consultants:  Nephrology  Vascular surgery  Procedures:  March 30: 2-D echocardiogram: Left ventricular ejection fraction 55-60%. Normal wall motion.  March 29: Transfused 2 units packed blood cells.  April 4: Right arm and central venogram  April 5: Creation of right brachial artery to cephalic vein AV fistula  April 5: Transfusion one unit packed red blood cells.  April 8: Right IJ hemodialysis catheter  Interim History: Interval documentation reviewed. Status post catheter insertion. Hemoglobin stable.  Subjective: Tired. Complains of right arm swelling.  Objective: Filed Vitals:   07/31/11 2310 08/01/11 0503 08/01/11 1100 08/01/11 1322  BP: 137/61 144/49 148/61   Pulse: 93 63 89   Temp:  98 F (36.7 C) 97.7 F (36.5 C) 97.3 F (36.3 C)  TempSrc:  Oral Oral   Resp:  20 24   Height:      Weight:      SpO2:  99% 100% 100%    Intake/Output Summary (Last 24 hours) at 08/01/11 1523 Last data filed at 08/01/11 1300  Gross per 24 hour  Intake    345 ml  Output      0 ml  Net    345 ml    Exam:   General: appears calm and mildly uncomfortable.  Cardiovascular:  Regular rate and rhythm. 2+ right lower extremity edema.  Respiratory: Clear to auscultation bilaterally. No wheezes, rales, rhonchi. Normal respiratory effort.   Psychiatric: Grossly normal mood and  affect.  Data Reviewed: Basic Metabolic Panel:  Lab [id number] 07/31/11 1500 07/31/11 0457 07/30/11 0430 07/28/11 0540  NA 129* 130* 127* 133* 132*  K 4.5 5.6* -- -- --  CL 94* 95* 95* 100 99  CO2 20 20 17* 19 20  GLUCOSE 289* 183* 322* 177* 242*  BUN 139* 137* 136* 129* 124*  CREATININE 4.81* 4.74* 4.33* 3.87* 3.40*  CALCIUM 8.9 9.4 9.1 9.0 9.2  MG -- -- -- -- --  PHOS -- -- -- 7.2* 7.0*   Liver Function Tests:  Lab 07/30/11 0430 07/28/11 0540  AST -- --  ALT -- --  ALKPHOS -- --  BILITOT -- --  PROT -- --  ALBUMIN 1.4* 1.5*   CBC:  Lab 08/01/11 0455 07/31/11 0457 07/30/11 0445 07/28/11 0540 07/27/11 0600  WBC 10.1 9.8 10.0 9.4 9.2  NEUTROABS -- -- -- -- --  HGB 7.5* 7.6* 6.9* 7.5* 7.5*  HCT 24.5* 25.0* 22.8* 25.2* 24.6*  MCV 88.8 88.7 89.8 86.9 87.5  PLT 403* 399 408* 395 405*   CBG:  Lab 08/01/11 1338 08/01/11 1143 08/01/11 0748 07/31/11 2043 07/31/11 1716  GLUCAP 292* 285* 289* 272* 201*    Recent Results (from the past 240 hour(s))  MRSA PCR SCREENING     Status: Normal   Collection Time   07/24/11  5:30 AM      Component Value Range Status Comment   MRSA by PCR NEGATIVE  NEGATIVE  Final   CLOSTRIDIUM DIFFICILE BY PCR  Status: Normal   Collection Time   07/24/11  2:55 PM      Component Value Range Status Comment   C difficile by pcr NEGATIVE  NEGATIVE  Final      Studies:  Scheduled Meds:    . benzonatate  100 mg Oral TID  . calcium acetate  667 mg Oral TID WC  . cycloSPORINE modified  100 mg Oral BID  . insulin aspart  0-5 Units Subcutaneous QHS  . insulin aspart  0-9 Units Subcutaneous TID WC  . insulin aspart  6 Units Subcutaneous TID WC  . insulin glargine  30 Units Subcutaneous QHS  . isosorbide mononitrate  60 mg Oral Daily  . labetalol  300 mg Oral BID  . levothyroxine  75 mcg Oral QAC breakfast  . metoCLOPramide  5 mg Oral TID PC & HS  . multivitamin  1 tablet Oral Daily  . pantoprazole  40 mg Oral Q1200  . predniSONE  5 mg  Oral Q breakfast  . rOPINIRole  1 mg Oral QHS  . sodium chloride  3 mL Intravenous Q12H  . sodium polystyrene  30 g Oral Once  . vancomycin  1,000 mg Intravenous 60 min Pre-Op   Continuous Infusions:     Assessment/Plan: 1. Acute/chronic renal failure: Worse today. Defer management to nephrology. Status post AV fistula April 5. 2. Hyperkalemia: Resolved. Secondary to acute renal failure. Not on potassium supplementation, heparin products or ACE-I/ARB.  3. Refractory anemia: Stable. Fecal occult blood negative. Status post total of 3 units packed red blood cells since admission. Aspirin remains on hold. Management per nephrology (followed and managed as an outpatient by Dr. Mercy Moore). 4. Fever: Resolved. Possible viral pneumonitis. Influenza negative.  5. Diabetes mellitus:  Stable. Continue Lantus, continue sliding scale insulin. May resume pump per diabetic coordinator.  6. History of renal transplantation with allograft nephropathy: Per nephrology. Continue cyclosporin and prednisone. 7. Obstructive sleep apnea: Patient refuses CPAP.  Follow up with Dr. Kellie Simmering in 4 weeks (approximately first week in May).  Code Status:  Full code  Family Communication: none at bedside  Disposition Plan:  home when improved.    Murray Hodgkins, MD  [facility]  Pager [phone] 7 AM-7 PM  7 PM-7 AM please call night coverage  [email or web address]  Password [email or web address]  08/01/2011, 3:23 PM    LOS: 10 days

## 2011-08-01 NOTE — Progress Notes (Signed)
Inpatient Diabetes Program Recommendations  AACE/ADA: New Consensus Statement on Inpatient Glycemic Control (2009)  Target Ranges:  Prepandial:   less than 140 mg/dL      Peak postprandial:   less than 180 mg/dL (1-2 hours)      Critically ill patients:  140 - 180 mg/dL   Reason for Visit: Referral to resume insulin pump  Inpatient Diabetes Program Recommendations Insulin - Basal: Increase to 35-40 units Correction (SSI): continue current order Insulin - Meal Coverage: agree with current order  Note: Pt just back from OR for HD catheter placement.  Made pt aware that he would need his pump supplies to restart tomorrow am.  Pt going to HD now.  RN to tell wife to have pump supplies tomorrow.  Thank you, Rosita Kea, RN, CNS, Diabetes Coordinator [phone])

## 2011-08-01 NOTE — Preoperative (Signed)
Beta Blockers   Reason not to administer Beta Blockers:Not Applicable 

## 2011-08-01 NOTE — Interval H&P Note (Signed)
History and Physical Interval Note:  08/01/2011 12:22 PM  Dale [name]  has presented today for surgery, with the diagnosis of end stage renal disease  The various methods of treatment have been discussed with the patient and family. After consideration of risks, benefits and other options for treatment, the patient has consented to  Procedure(s) (LRB): INSERTION OF DIALYSIS CATHETER (N/A) as a surgical intervention .  The patients' history has been reviewed, patient examined, no change in status, stable for surgery.  I have reviewed the patients' chart and labs.  Questions were answered to the patient's satisfaction.     [name]

## 2011-08-01 NOTE — Transfer of Care (Signed)
Immediate Anesthesia Transfer of Care Note  Patient: Dale [name]  Procedure(s) Performed: Procedure(s) (LRB): INSERTION OF DIALYSIS CATHETER (Right)  Patient Location: PACU  Anesthesia Type: MAC  Level of Consciousness: awake, alert  and oriented  Airway & Oxygen Therapy: Patient Spontanous Breathing and Patient connected to nasal cannula oxygen  Post-op Assessment: Report given to PACU RN, Post -op Vital signs reviewed and stable and Patient moving all extremities  Post vital signs: Reviewed and stable  Complications: No apparent anesthesia complications

## 2011-08-01 NOTE — Progress Notes (Signed)
Spoke with patient about his Cpap@hs , Patient states that he just received his cpap not long ago before he was hospitalized and the home care could not solve issues with his cpap. Therefore, for now patient wishes not to wear Cpap  Here in the hospital.

## 2011-08-01 NOTE — Anesthesia Postprocedure Evaluation (Signed)
Anesthesia Post Note  Patient: Dale [name]  Procedure(s) Performed: Procedure(s) (LRB): INSERTION OF DIALYSIS CATHETER (Right)  Anesthesia type: MAC  Patient location: PACU  Post pain: Pain level controlled and Adequate analgesia  Post assessment: Post-op Vital signs reviewed, Patient's Cardiovascular Status Stable and Respiratory Function Stable  Last Vitals:  Filed Vitals:   08/01/11 1322  BP:   Pulse:   Temp: 36.3 C  Resp:     Post vital signs: Reviewed and stable  Level of consciousness: awake, alert  and oriented  Complications: No apparent anesthesia complications

## 2011-08-01 NOTE — Op Note (Signed)
OPERATIVE REPORT  DATE OF SURGERY: 08/01/2011  PATIENT: Dale [name], 46 y.o. male MRN: [id number]  DOB: October 13, 1965  PRE-OPERATIVE DIAGNOSIS: End-stage renal disease  POST-OPERATIVE DIAGNOSIS:  Same  PROCEDURE: Right IJ hemodialysis catheter  SURGEON:  Curt Jews, M.D.  PHYSICIAN ASSISTANT: None  ANESTHESIA:  mac  EBL: Minimal ml  Total I/O In: 75 [I.V.:75] Out: -   BLOOD ADMINISTERED: None  DRAINS: None  SPECIMEN: None  COUNTS CORRECT:  YES  PLAN OF CARE: PACU with chest x-ray pending   PATIENT DISPOSITION:  PACU - hemodynamically stable  PROCEDURE DETAILS: The patient was taken to the operating room placed supine position where the area of the right and left neck were imaged with ultrasound showing patent jugular veins bilaterally. The patient was prepped and draped in usual sterile fashion. Patient was laid flat since he was markedly volume overload did not go into Trendelenburg for respiratory issues. Using local anesthesia and a finder needle the internal jugular vein was identified. Next using a Seldinger technique a guidewire specimen the level right atrium this was confirmed with fluoroscopy. A dilator and peel-away sheath was passed over the guidewire. The dilator and guidewire were removed. A way 7 cm.the catheter was passed through the newly sheath which was removed. The catheter was positioned level the distal right atrium. The cath was brought through a subcutaneous tunnel through a separate stab incision. 2 lm ports were attached in both lumens flushed and aspirated easily were locked with 1000 per cc heparin. The catheter was secured to the skin 3-0 nylon stitch and the entry site was noted for subcuticular Vicryl stitch. Sterile dressing was applied and the patient was taken to the recovery room where chest x-ray is pending.   Curt Jews, M.D. 08/01/2011 1:16 PM

## 2011-08-02 ENCOUNTER — Inpatient Hospital Stay (HOSPITAL_COMMUNITY): Payer: Medicare Other

## 2011-08-02 ENCOUNTER — Encounter (HOSPITAL_COMMUNITY): Payer: Self-pay | Admitting: Vascular Surgery

## 2011-08-02 LAB — CBC
HCT: 24.7 % — ABNORMAL LOW (ref 39.0–52.0)
Hemoglobin: 7.4 g/dL — ABNORMAL LOW (ref 13.0–17.0)
MCH: 26.5 pg (ref 26.0–34.0)
MCHC: 30 g/dL (ref 30.0–36.0)
MCV: 88.5 fL (ref 78.0–100.0)
Platelets: 429 10*3/uL — ABNORMAL HIGH (ref 150–400)
RBC: 2.79 MIL/uL — ABNORMAL LOW (ref 4.22–5.81)
RDW: 19.5 % — ABNORMAL HIGH (ref 11.5–15.5)
WBC: 12.4 10*3/uL — ABNORMAL HIGH (ref 4.0–10.5)

## 2011-08-02 LAB — GLUCOSE, CAPILLARY
Glucose-Capillary: 100 mg/dL — ABNORMAL HIGH (ref 70–99)
Glucose-Capillary: 58 mg/dL — ABNORMAL LOW (ref 70–99)
Glucose-Capillary: 60 mg/dL — ABNORMAL LOW (ref 70–99)
Glucose-Capillary: 316 mg/dL — ABNORMAL HIGH (ref 70–99)
Glucose-Capillary: 294 mg/dL — ABNORMAL HIGH (ref 70–99)
Glucose-Capillary: 329 mg/dL — ABNORMAL HIGH (ref 70–99)
Glucose-Capillary: 165 mg/dL — ABNORMAL HIGH (ref 70–99)

## 2011-08-02 LAB — BASIC METABOLIC PANEL
BUN: 107 mg/dL — ABNORMAL HIGH (ref 6–23)
CO2: 23 mEq/L (ref 19–32)
Calcium: 8.8 mg/dL (ref 8.4–10.5)
Chloride: 97 mEq/L (ref 96–112)
Creatinine, Ser: 4.64 mg/dL — ABNORMAL HIGH (ref 0.50–1.35)
GFR calc Af Amer: 16 mL/min — ABNORMAL LOW (ref >90)
GFR calc non Af Amer: 14 mL/min — ABNORMAL LOW (ref >90)
Glucose, Bld: 263 mg/dL — ABNORMAL HIGH (ref 70–99)
Potassium: 3.8 mEq/L (ref 3.5–5.1)
Sodium: 133 mEq/L — ABNORMAL LOW (ref 135–145)

## 2011-08-02 LAB — IRON AND TIBC
Iron: 11 ug/dL — ABNORMAL LOW (ref 42–135)
Saturation Ratios: 11 % — ABNORMAL LOW (ref 20–55)
TIBC: 103 ug/dL — ABNORMAL LOW (ref 215–435)
UIBC: 92 ug/dL — ABNORMAL LOW (ref 125–400)

## 2011-08-02 LAB — FERRITIN: Ferritin: 2190 ng/mL — ABNORMAL HIGH (ref 22–322)

## 2011-08-02 LAB — HEPATITIS B CORE ANTIBODY, TOTAL: Hep B Core Total Ab: NEGATIVE

## 2011-08-02 LAB — PARATHYROID HORMONE, INTACT (NO CA): PTH: 172.4 pg/mL — ABNORMAL HIGH (ref 14.0–72.0)

## 2011-08-02 MED ORDER — PARICALCITOL 5 MCG/ML IV SOLN
INTRAVENOUS | Status: AC
  Filled 2011-08-02: qty 1

## 2011-08-02 MED ORDER — HEPARIN SODIUM (PORCINE) 1000 UNIT/ML DIALYSIS
20.0000 [IU]/kg | INTRAMUSCULAR | Status: DC | PRN
  Administered 2011-08-02: 2400 [IU] via INTRAVENOUS_CENTRAL
  Filled 2011-08-02: qty 3

## 2011-08-02 MED ORDER — ALPRAZOLAM 0.5 MG PO TABS
0.5000 mg | ORAL_TABLET | Freq: Every evening | ORAL | Status: DC | PRN
  Administered 2011-08-02 – 2011-08-06 (×2): 0.5 mg via ORAL
  Filled 2011-08-02 (×2): qty 1

## 2011-08-02 MED ORDER — DARBEPOETIN ALFA-POLYSORBATE 150 MCG/0.3ML IJ SOLN
INTRAMUSCULAR | Status: AC
  Filled 2011-08-02: qty 0.3

## 2011-08-02 MED ORDER — HYDROMORPHONE HCL PF 1 MG/ML IJ SOLN
INTRAMUSCULAR | Status: AC
  Administered 2011-08-02: 1 mg via INTRAVENOUS
  Filled 2011-08-02: qty 1

## 2011-08-02 MED ORDER — CALCIUM ACETATE 667 MG PO CAPS
1334.0000 mg | ORAL_CAPSULE | Freq: Three times a day (TID) | ORAL | Status: DC
  Administered 2011-08-03 – 2011-08-12 (×22): 1334 mg via ORAL
  Filled 2011-08-02 (×35): qty 2

## 2011-08-02 MED ORDER — PARICALCITOL 5 MCG/ML IV SOLN
2.0000 ug | INTRAVENOUS | Status: DC
  Administered 2011-08-04 – 2011-08-13 (×5): 2 ug via INTRAVENOUS
  Filled 2011-08-02 (×5): qty 0.4

## 2011-08-02 MED ORDER — DARBEPOETIN ALFA-POLYSORBATE 150 MCG/0.3ML IJ SOLN
150.0000 ug | INTRAMUSCULAR | Status: DC
  Administered 2011-08-09: 150 ug via INTRAVENOUS
  Filled 2011-08-02 (×2): qty 0.3

## 2011-08-02 NOTE — Progress Notes (Signed)
PROGRESS NOTE  Darrelle Bobb [id number] DOB: 1965-10-14 DOA: 07/22/2011 PCP: No primary provider on file. Nephrologist: Dr. Mercy Moore  Brief narrative: 46 year old man presented to short stay for iron infusion. Was found to have a hemoglobin of 6.7 and referred to the emergency department. There is also some concern of fever, however fever resolved immediately an initial infectious workup was unremarkable. Patient did undergo transfusion however he remained hospitalized because of worsening renal failure--when this failed to significantly improved the patient underwent AV graft placement and temporary dialysis catheter placement and was started on hemodialysis April 8. He developed fever and April 8 for the first time since admission. Disposition is pending resolution of fever an outpatient establishment of hemodialysis.  Past medical history: End-stage renal disease secondary to diabetic nephropathy, status post renal transplant 1995, refractory anemia (on Procrit and iron infusion as an outpatient), fibromyalgia  Consultants:  Nephrology  Vascular surgery  Procedures:  March 30: 2-D echocardiogram: Left ventricular ejection fraction 55-60%. Normal wall motion.  March 29: Transfused 2 units packed blood cells.  April 4: Right arm and central venogram  April 5: Creation of right brachial artery to cephalic vein AV fistula  April 5: Transfusion one unit packed red blood cells.  April 8: Right IJ hemodialysis catheter  Interim History: Interval documentation reviewed. 2 episodes of hypoglycemia yesterday evening. Hemodialysis initiated last night.  Subjective: Poor sleep. Complains of chronic back pain. Did not eat dinner last night because of poor appetite.  Objective: Filed Vitals:   08/01/11 2102 08/01/11 2234 08/02/11 0216 08/02/11 0453  BP: 116/62 121/50  124/55  Pulse: 98 95 99 92  Temp: 101 F (38.3 C) 98.1 F (36.7 C) 100.2 F (37.9 C) 100 F (37.8 C)  TempSrc:  Oral Oral  Oral  Resp: 21 20  20   Height:      Weight: 119.7 kg (263 lb 14.3 oz) 119.7 kg (263 lb 14.3 oz)    SpO2: 94% 100% 99% 99%    Intake/Output Summary (Last 24 hours) at 08/02/11 0837 Last data filed at 08/01/11 2100  Gross per 24 hour  Intake     75 ml  Output   1508 ml  Net  -1433 ml    Exam:   General: appears calm and mildly uncomfortable.  Cardiovascular:  Regular rate and rhythm. 2+ right lower extremity edema.  Respiratory: Clear to auscultation bilaterally. No wheezes, rales, rhonchi. Normal respiratory effort.   Psychiatric: Grossly normal mood and affect. Oriented to himself, hospital location, president, year.  Data Reviewed: Basic Metabolic Panel:  Lab 0000000 1846 08/01/11 0455 07/31/11 1500 07/31/11 0457 07/30/11 0430 07/28/11 0540  NA 133* 129* 130* 127* 133* --  K 4.8 4.5 -- -- -- --  CL 96 94* 95* 95* 100 --  CO2 20 20 20  17* 19 --  GLUCOSE 173* 289* 183* 322* 177* --  BUN 137* 139* 137* 136* 129* --  CREATININE 4.59* 4.81* 4.74* 4.33* 3.87* --  CALCIUM 9.2 8.9 9.4 9.1 9.0 --  MG -- -- -- -- -- --  PHOS 8.5* -- -- -- 7.2* 7.0*   Liver Function Tests:  Lab 07/30/11 0430 07/28/11 0540  AST -- --  ALT -- --  ALKPHOS -- --  BILITOT -- --  PROT -- --  ALBUMIN 1.4* 1.5*   CBC:  Lab 08/01/11 0455 07/31/11 0457 07/30/11 0445 07/28/11 0540 07/27/11 0600  WBC 10.1 9.8 10.0 9.4 9.2  NEUTROABS -- -- -- -- --  HGB 7.5* 7.6* 6.9* 7.5*  7.5*  HCT 24.5* 25.0* 22.8* 25.2* 24.6*  MCV 88.8 88.7 89.8 86.9 87.5  PLT 403* 399 408* 395 405*   CBG:  Lab 08/02/11 0801 08/02/11 0340 08/01/11 2311 08/01/11 2244 08/01/11 2232  GLUCAP 294* 316* 100* 58* 60*    Recent Results (from the past 240 hour(s))  MRSA PCR SCREENING     Status: Normal   Collection Time   07/24/11  5:30 AM      Component Value Range Status Comment   MRSA by PCR NEGATIVE  NEGATIVE  Final   CLOSTRIDIUM DIFFICILE BY PCR     Status: Normal   Collection Time   07/24/11  2:55 PM       Component Value Range Status Comment   C difficile by pcr NEGATIVE  NEGATIVE  Final     Studies:  Scheduled Meds:    . acetaminophen      . benzonatate  100 mg Oral TID  . calcium acetate  667 mg Oral TID WC  . cycloSPORINE modified  100 mg Oral BID  . insulin aspart  0-5 Units Subcutaneous QHS  . insulin aspart  0-9 Units Subcutaneous TID WC  . insulin aspart  6 Units Subcutaneous TID WC  . insulin glargine  30 Units Subcutaneous QHS  . isosorbide mononitrate  60 mg Oral Daily  . labetalol  300 mg Oral BID  . levothyroxine  75 mcg Oral QAC breakfast  . metoCLOPramide  5 mg Oral TID PC & HS  . multivitamin  1 tablet Oral Daily  . pantoprazole  40 mg Oral Q1200  . predniSONE  5 mg Oral Q breakfast  . rOPINIRole  1 mg Oral QHS  . sodium chloride  3 mL Intravenous Q12H  . DISCONTD: vancomycin  1,000 mg Intravenous 60 min Pre-Op   Continuous Infusions:     Assessment/Plan: 1. Fever: Resolved since admission until recurrence April 7 in the evening. Etiology unclear. On initial presentation it was presumed that his fever was secondary to viral pneumonitis. Influenza was negative. No fever recurred until April 8. Further infectious workup will be conducted including chest x-ray and urinalysis. 2. Acute/chronic renal failure: Hemodialysis initiated April 8. Status post AV fistula April 5. Repeat basic metabolic panel. 3. Hyperkalemia: Resolved. Secondary to acute renal failure. Not on potassium supplementation, heparin products or ACE-I/ARB.  4. Refractory anemia: Stable. Fecal occult blood negative. Status post total of 3 units packed red blood cells since admission. Aspirin remains on hold. Management per nephrology (followed and managed as an outpatient by Dr. Mercy Moore).  5. Diabetes mellitus: Stable. Continue Lantus, continue sliding scale insulin. May resume pump per diabetic coordinator.  6. History of renal transplantation with allograft nephropathy: Per nephrology. Continue  cyclosporin and prednisone. 7. Obstructive sleep apnea: Patient refuses CPAP.  Follow up with Dr. Kellie Simmering in 4 weeks (approximately first week in May).  Code Status:  Full code  Family Communication: none at bedside  Disposition Plan:  home when improved.    Murray Hodgkins, MD  [facility]  Pager [phone] 7 AM-7 PM  7 PM-7 AM please call night coverage  [email or web address]  Password [email or web address]  08/02/2011, 8:37 AM    LOS: 11 days

## 2011-08-02 NOTE — Progress Notes (Signed)
[facility]  On HD via R IJ cath BP--130/60 Goal--4 L Hgb--7.5   K+--4.8

## 2011-08-02 NOTE — Progress Notes (Signed)
Pt was picked up from dialysis by floor nurse and nurse tech per H/D Rn request, pt's blood sugar was 60 when he arrived on the floor from dialysis, pt was given an apple juice and gram crackers, blood sugar was checked after 15 mins and it was 58. Pt was given another apple juice and blood sugar came to 100 when checked after 15 mins. Will continue to monitor patient.---- Shantil Vallejo, D. Rn

## 2011-08-02 NOTE — Progress Notes (Signed)
[facility]  Subjective:  Awake, SOB.  Just back from Echo lab.  Wife at bedside.  She thinks he'll need rehab or SNF before she can take care of him at home   Objective: Vital signs in last 24 hours: Blood pressure 131/59, pulse 93, temperature 98.8 F (37.1 C), temperature source Oral, resp. rate 20, height 5\' 8"  (1.727 m), weight 119.7 kg (263 lb 14.3 oz), SpO2 98.00%.    PHYSICAL EXAM General--awake, dyspneic with conversation Chest--cath R IJ clean, clear Heart--no rub Abd--nontender, allograft in LLQ Extr--L BKA, R upper arm AVF, still with edema R LE  Lab Results:   Lab 08/01/11 1846 08/01/11 0455 07/31/11 1500 07/30/11 0430 07/28/11 0540  NA 133* 129* 130* -- --  K 4.8 4.5 5.6* -- --  CL 96 94* 95* -- --  CO2 20 20 20  -- --  BUN 137* 139* 137* -- --  CREATININE 4.59* 4.81* 4.74* -- --  ALB -- -- -- -- --  GLUCOSE 173* -- -- -- --  CALCIUM 9.2 8.9 9.4 -- --  PHOS 8.5* -- -- 7.2* 7.0*     Basename 08/01/11 0455 07/31/11 0457  WBC 10.1 9.8  HGB 7.5* 7.6*  HCT 24.5* 25.0*  PLT 403* 399     Scheduled:   . acetaminophen      . benzonatate  100 mg Oral TID  . calcium acetate  667 mg Oral TID WC  . cycloSPORINE modified  100 mg Oral BID  . darbepoetin (ARANESP) injection - DIALYSIS  150 mcg Intravenous Q Tue-HD  . insulin aspart  0-5 Units Subcutaneous QHS  . insulin aspart  0-9 Units Subcutaneous TID WC  . insulin aspart  6 Units Subcutaneous TID WC  . insulin glargine  30 Units Subcutaneous QHS  . isosorbide mononitrate  60 mg Oral Daily  . labetalol  300 mg Oral BID  . levothyroxine  75 mcg Oral QAC breakfast  . metoCLOPramide  5 mg Oral TID PC & HS  . multivitamin  1 tablet Oral Daily  . pantoprazole  40 mg Oral Q1200  . paricalcitol  2 mcg Intravenous 3 times weekly  . predniSONE  5 mg Oral Q breakfast  . rOPINIRole  1 mg Oral QHS  . sodium chloride  3 mL Intravenous Q12H  . DISCONTD: vancomycin  1,000 mg Intravenous 60 min Pre-Op     Continuous:   Assessment/Plan: 1. Failing renal transplant--continue anti-rejection meds (cyclosporine & prednisone)  2. Fluid overload--HD again  today  3. CKD--" 4. DM-continue insulin, CBG  5. Secondary PTH-- PTH pending, phosphorus 8.5.  Increase phosLo to 2 TID.  Was on zemplar 1 mcg/d PTA.  Will begin 2 mcg each HD 6. Anemia-- FeTIBC 11 %, but ferritin 2190.  Begin aranesp 150/wk 7.  High BP--may be able to reduce labetalol if BP drops with HD     LOS: 11 days   Jood Retana F 08/02/2011,12:48 PM   .labalb

## 2011-08-02 NOTE — Progress Notes (Signed)
Inpatient Diabetes Program Recommendations  AACE/ADA: New Consensus Statement on Inpatient Glycemic Control (2009)  Target Ranges:  Prepandial:   less than 140 mg/dL      Peak postprandial:   less than 180 mg/dL (1-2 hours)      Critically ill patients:  140 - 180 mg/dL   Reason for Visit: Restart insulin pump  Inpatient Diabetes Program Recommendations Insulin - Basal: Increase to 35-40 units Correction (SSI): continue current order Insulin - Meal Coverage: agree with current order  Note: Patient has all supplies needed to restart insulin pump.  However, patient has decided that he wants to wait until he gets home to restart.  Patient to discuss with MD.  Reminder of insulin pump settings: Total basal rate = 39.55. CHO Ration 1:7 until 5:30 PM, then 1:5.  Insulin factor it 1:20.   [phone])

## 2011-08-02 NOTE — Progress Notes (Signed)
   CARE MANAGEMENT NOTE 08/02/2011  Patient:  Dale [name], Dale [name]   Account Number:  [id number]  Date Initiated:  07/25/2011  Documentation initiated by:  [name]  Subjective/Objective Assessment:   PT WAS ADMITTED WITH ANEMIA     Action/Plan:   PROGRESSION OF CARE AND DISCHARGE PLANNING   Anticipated DC Date:     Anticipated DC Plan:        DC Planning Services  CM consult      Choice offered to / List presented to:             Status of service:  In process, will continue to follow Medicare Important Message given?   (If response is "NO", the following Medicare IM given date fields will be blank) Date Medicare IM given:   Date Additional Medicare IM given:    Discharge Disposition:    Per UR Regulation:  Reviewed for med. necessity/level of care/duration of stay  If discussed at [place] of Stay Meetings, dates discussed:    Comments:  UR COMPLETED 08/02/11 [name], RN, [place]  UR COMPLETED 07/29/11 [name], RN, BSN Junior 07/25/11 [name], RN, BSN [phone]

## 2011-08-02 NOTE — Progress Notes (Signed)
CSW provided pt's wife with advanced directives and answered questions. Pt and pt's wife to discuss and will notify CSW if/when they wish to have Living Will notarized. CSW signing off unless contacted for advanced directives as no other CSW needs reported or noted.  Wandra Feinstein, MSW, [place] [phone] (coverage)

## 2011-08-03 ENCOUNTER — Inpatient Hospital Stay (HOSPITAL_COMMUNITY): Payer: Medicare Other

## 2011-08-03 DIAGNOSIS — R509 Fever, unspecified: Secondary | ICD-10-CM

## 2011-08-03 LAB — CBC
HCT: 24.8 % — ABNORMAL LOW (ref 39.0–52.0)
Hemoglobin: 7.3 g/dL — ABNORMAL LOW (ref 13.0–17.0)
MCH: 26.5 pg (ref 26.0–34.0)
MCHC: 29.4 g/dL — ABNORMAL LOW (ref 30.0–36.0)
MCV: 90.2 fL (ref 78.0–100.0)
Platelets: 351 10*3/uL (ref 150–400)
RBC: 2.75 MIL/uL — ABNORMAL LOW (ref 4.22–5.81)
RDW: 19.4 % — ABNORMAL HIGH (ref 11.5–15.5)
WBC: 10.7 10*3/uL — ABNORMAL HIGH (ref 4.0–10.5)

## 2011-08-03 LAB — GLUCOSE, CAPILLARY
Glucose-Capillary: 335 mg/dL — ABNORMAL HIGH (ref 70–99)
Glucose-Capillary: 226 mg/dL — ABNORMAL HIGH (ref 70–99)
Glucose-Capillary: 157 mg/dL — ABNORMAL HIGH (ref 70–99)
Glucose-Capillary: 120 mg/dL — ABNORMAL HIGH (ref 70–99)

## 2011-08-03 LAB — BASIC METABOLIC PANEL
BUN: 66 mg/dL — ABNORMAL HIGH (ref 6–23)
CO2: 25 mEq/L (ref 19–32)
Calcium: 8.7 mg/dL (ref 8.4–10.5)
Chloride: 94 mEq/L — ABNORMAL LOW (ref 96–112)
Creatinine, Ser: 3.9 mg/dL — ABNORMAL HIGH (ref 0.50–1.35)
GFR calc Af Amer: 20 mL/min — ABNORMAL LOW (ref >90)
GFR calc non Af Amer: 17 mL/min — ABNORMAL LOW (ref >90)
Glucose, Bld: 333 mg/dL — ABNORMAL HIGH (ref 70–99)
Potassium: 3.9 mEq/L (ref 3.5–5.1)
Sodium: 132 mEq/L — ABNORMAL LOW (ref 135–145)

## 2011-08-03 LAB — PREPARE RBC (CROSSMATCH)

## 2011-08-03 MED ORDER — INSULIN GLARGINE 100 UNIT/ML ~~LOC~~ SOLN
15.0000 [IU] | Freq: Two times a day (BID) | SUBCUTANEOUS | Status: DC
  Administered 2011-08-03 – 2011-08-08 (×10): 15 [IU] via SUBCUTANEOUS

## 2011-08-03 MED ORDER — INSULIN ASPART 100 UNIT/ML ~~LOC~~ SOLN
0.0000 [IU] | Freq: Three times a day (TID) | SUBCUTANEOUS | Status: DC
  Administered 2011-08-03: 5 [IU] via SUBCUTANEOUS
  Administered 2011-08-03 – 2011-08-04 (×2): 3 [IU] via SUBCUTANEOUS
  Administered 2011-08-05 – 2011-08-06 (×3): 2 [IU] via SUBCUTANEOUS
  Administered 2011-08-06: 5 [IU] via SUBCUTANEOUS
  Administered 2011-08-07: 3 [IU] via SUBCUTANEOUS
  Administered 2011-08-07: 2 [IU] via SUBCUTANEOUS
  Administered 2011-08-07 – 2011-08-08 (×2): 8 [IU] via SUBCUTANEOUS
  Administered 2011-08-08: 2 [IU] via SUBCUTANEOUS
  Administered 2011-08-09: 3 [IU] via SUBCUTANEOUS

## 2011-08-03 MED ORDER — LABETALOL HCL 100 MG PO TABS
100.0000 mg | ORAL_TABLET | Freq: Two times a day (BID) | ORAL | Status: DC
  Administered 2011-08-03 – 2011-08-13 (×16): 100 mg via ORAL
  Filled 2011-08-03 (×21): qty 1

## 2011-08-03 MED ORDER — HEPARIN SODIUM (PORCINE) 1000 UNIT/ML DIALYSIS
20.0000 [IU]/kg | INTRAMUSCULAR | Status: DC | PRN
  Filled 2011-08-03: qty 3

## 2011-08-03 MED ORDER — FUROSEMIDE 10 MG/ML IJ SOLN: 10.0000 mg | INTRAMUSCULAR | Status: DC

## 2011-08-03 MED ORDER — DIPHENHYDRAMINE HCL 50 MG/ML IJ SOLN: 25.0000 mg | Freq: Four times a day (QID) | INTRAMUSCULAR | Status: DC | PRN

## 2011-08-03 MED ORDER — LORAZEPAM 2 MG/ML IJ SOLN: 0.5000 mg | Freq: Once | INTRAMUSCULAR | Status: DC

## 2011-08-03 MED ORDER — INSULIN ASPART 100 UNIT/ML ~~LOC~~ SOLN
8.0000 [IU] | Freq: Three times a day (TID) | SUBCUTANEOUS | Status: DC
  Administered 2011-08-03 – 2011-08-09 (×13): 8 [IU] via SUBCUTANEOUS

## 2011-08-03 NOTE — Consult Note (Signed)
Date of Admission:  07/22/2011  Date of Consult:  08/03/2011  Reason for Consult: low grad fevers in patient with renal transplant Referring Physician: Dr. [name]   HPI: Dale [name] is an 46 y.o. male with past medical history of diabetes, end-stage renal disease secondary to diabetic nephropathy, renal transplant, chronic anemia, obstructive sleep apnea on CPAP initially presented to short stay at [facility] for IV iron and Procrit for his anemia. Patient was sent to the ED due to hemoglobin of 6.8 was confirmed on the labs and patient being symptomatic. [facility] was [facility] to whom he also disclosed a several week history of subjective and objective fevers. His CXr initially showed possible pneumonitis. Flu PCR was negative. On the 8th he underwent HD catheter placement and that same day spiked fever to 101. He has had low grad fevers to 100 since without an obvious source. His blood cultures drawn yesterday are without growth and C diff pcr negative. He does endorse lower back pain and dyspnea but no other focal symptoms.    Past Medical History  Diagnosis Date  . Renal disease   . Diabetes mellitus     Past Surgical History  Procedure Date  . Appendectomy   . Hernia repair   . Kidney transplant   . Cataract extraction   . Arteriovenous graft placement   . Av fistula placement   . Toe amputation   . Below knee leg amputation   . Carpal tunnel release   . Shoulder surgery   . Av fistula placement 07/29/2011    Procedure: ARTERIOVENOUS (AV) FISTULA CREATION;  Surgeon: Mal Misty, MD;  Location: [place];  Service: Vascular;  Laterality: Right;  Brachial cephalic  . Insertion of dialysis catheter 08/01/2011    Procedure: INSERTION OF DIALYSIS CATHETER;  Surgeon: Rosetta Posner, MD;  Location: [place];  Service: Vascular;  Laterality: Right;  insertion of dialysis catheter on right internal jugular vein  ergies:   Allergies  Allergen Reactions  . Amoxicillin   . Ciprofloxacin   .  Clindamycin/Lincomycin   . Codeine   . Levaquin   . Vasotec Cough     Medications: I have reviewed patients current medications as documented in Epic Anti-infectives     Start     Dose/Rate Route Frequency Ordered Stop   07/31/11 1358   vancomycin (VANCOCIN) IVPB 1000 mg/200 mL premix  Status:  Discontinued        1,000 mg 200 mL/hr over 60 Minutes Intravenous 60 min pre-op 07/31/11 1358 08/01/11 1555          Social History:  reports that he has never smoked. He does not have any smokeless tobacco history on file. He reports that he drinks alcohol. He reports that he does not use illicit drugs.  No family history on file.  As in HPI and primary teams notes otherwise 12 point review of systems is negative  Blood pressure 129/56, pulse 86, temperature 99.2 F (37.3 C), temperature source Oral, resp. rate 22, height 5\' 8"  (1.727 m), weight 260 lb 9.3 oz (118.2 kg), SpO2 95.00%. General: Alert and awake, oriented x3, tachypnic, obese HEENT: anicteric sclera, pupils reactive to light and accommodation, EOMI, oropharynx clear and without exudate CVS tachycardic rate, normal r,  no murmur rubs or gallops Chest:realtively lear to auscultation bilaterally, no wheezing, rales or rhonchi Abdomen: soft protuberant nontender, nondistended, normal bowel sounds, Extremities: amputation site clean Skin: HD site clean, AVG site clean Neuro: nonfocal, strength and  sensation intact   Results for orders placed during the hospital encounter of 07/22/11 (from the past 48 hour(s))  BASIC METABOLIC PANEL     Status: Abnormal   Collection Time   08/01/11  6:46 PM      Component Value Range Comment   Sodium 133 (*) 135 - 145 (mEq/L)    Potassium 4.8  3.5 - 5.1 (mEq/L)    Chloride 96  96 - 112 (mEq/L)    CO2 20  19 - 32 (mEq/L)    Glucose, Bld 173 (*) 70 - 99 (mg/dL)    BUN 137 (*) 6 - 23 (mg/dL)    Creatinine, Ser 4.59 (*) 0.50 - 1.35 (mg/dL)    Calcium 9.2  8.4 - 10.5 (mg/dL)    GFR calc non  Af Amer 14 (*) >90 (mL/min)    GFR calc Af Amer 16 (*) >90 (mL/min)   PHOSPHORUS     Status: Abnormal   Collection Time   08/01/11  6:46 PM      Component Value Range Comment   Phosphorus 8.5 (*) 2.3 - 4.6 (mg/dL)   HEPATITIS B SURFACE ANTIGEN     Status: Normal   Collection Time   08/01/11  6:46 PM      Component Value Range Comment   Hepatitis B Surface Ag NEGATIVE  NEGATIVE    HEPATITIS B SURFACE ANTIBODY     Status: Abnormal   Collection Time   08/01/11  6:46 PM      Component Value Range Comment   Hep B S Ab POSITIVE (*) NEGATIVE    HEPATITIS B CORE ANTIBODY, TOTAL     Status: Normal   Collection Time   08/01/11  6:46 PM      Component Value Range Comment   Hep B Core Total Ab NEGATIVE  NEGATIVE    IRON AND TIBC     Status: Abnormal   Collection Time   08/01/11  7:23 PM      Component Value Range Comment   Iron 11 (*) 42 - 135 (ug/dL)    TIBC 103 (*) 215 - 435 (ug/dL)    Saturation Ratios 11 (*) 20 - 55 (%)    UIBC 92 (*) 125 - 400 (ug/dL)   FERRITIN     Status: Abnormal   Collection Time   08/01/11  7:23 PM      Component Value Range Comment   Ferritin 2190 (*) 22 - 322 (ng/mL)   PARATHYROID HORMONE, INTACT (NO CA)     Status: Abnormal   Collection Time   08/01/11  7:23 PM      Component Value Range Comment   PTH 172.4 (*) 14.0 - 72.0 (pg/mL)   GLUCOSE, CAPILLARY     Status: Abnormal   Collection Time   08/01/11 10:32 PM      Component Value Range Comment   Glucose-Capillary 60 (*) 70 - 99 (mg/dL)    Comment 1 Notify RN      Comment 2 Documented in Chart     GLUCOSE, CAPILLARY     Status: Abnormal   Collection Time   08/01/11 10:44 PM      Component Value Range Comment   Glucose-Capillary 58 (*) 70 - 99 (mg/dL)    Comment 1 Notify RN      Comment 2 Documented in Chart     GLUCOSE, CAPILLARY     Status: Abnormal   Collection Time   08/01/11 11:11 PM      Component  Value Range Comment   Glucose-Capillary 100 (*) 70 - 99 (mg/dL)    Comment 1 Notify RN      Comment 2  Documented in Chart     GLUCOSE, CAPILLARY     Status: Abnormal   Collection Time   08/02/11  3:40 AM      Component Value Range Comment   Glucose-Capillary 316 (*) 70 - 99 (mg/dL)    Comment 1 Notify RN      Comment 2 Documented in Chart     GLUCOSE, CAPILLARY     Status: Abnormal   Collection Time   08/02/11  8:01 AM      Component Value Range Comment   Glucose-Capillary 294 (*) 70 - 99 (mg/dL)    Comment 1 Documented in Chart      Comment 2 Notify RN     GLUCOSE, CAPILLARY     Status: Abnormal   Collection Time   08/02/11 12:58 PM      Component Value Range Comment   Glucose-Capillary 329 (*) 70 - 99 (mg/dL)   BASIC METABOLIC PANEL     Status: Abnormal   Collection Time   08/02/11  3:31 PM      Component Value Range Comment   Sodium 133 (*) 135 - 145 (mEq/L)    Potassium 3.8  3.5 - 5.1 (mEq/L) DELTA CHECK NOTED   Chloride 97  96 - 112 (mEq/L)    CO2 23  19 - 32 (mEq/L)    Glucose, Bld 263 (*) 70 - 99 (mg/dL)    BUN 107 (*) 6 - 23 (mg/dL)    Creatinine, Ser 4.64 (*) 0.50 - 1.35 (mg/dL)    Calcium 8.8  8.4 - 10.5 (mg/dL)    GFR calc non Af Amer 14 (*) >90 (mL/min)    GFR calc Af Amer 16 (*) >90 (mL/min)   CBC     Status: Abnormal   Collection Time   08/02/11  3:31 PM      Component Value Range Comment   WBC 12.4 (*) 4.0 - 10.5 (K/uL)    RBC 2.79 (*) 4.22 - 5.81 (MIL/uL)    Hemoglobin 7.4 (*) 13.0 - 17.0 (g/dL)    HCT 24.7 (*) 39.0 - 52.0 (%)    MCV 88.5  78.0 - 100.0 (fL)    MCH 26.5  26.0 - 34.0 (pg)    MCHC 30.0  30.0 - 36.0 (g/dL)    RDW 19.5 (*) 11.5 - 15.5 (%)    Platelets 429 (*) 150 - 400 (K/uL)   GLUCOSE, CAPILLARY     Status: Abnormal   Collection Time   08/02/11  9:03 PM      Component Value Range Comment   Glucose-Capillary 165 (*) 70 - 99 (mg/dL)   BASIC METABOLIC PANEL     Status: Abnormal   Collection Time   08/03/11  5:10 AM      Component Value Range Comment   Sodium 132 (*) 135 - 145 (mEq/L)    Potassium 3.9  3.5 - 5.1 (mEq/L)    Chloride 94 (*) 96 - 112  (mEq/L)    CO2 25  19 - 32 (mEq/L)    Glucose, Bld 333 (*) 70 - 99 (mg/dL)    BUN 66 (*) 6 - 23 (mg/dL) DELTA CHECK NOTED   Creatinine, Ser 3.90 (*) 0.50 - 1.35 (mg/dL)    Calcium 8.7  8.4 - 10.5 (mg/dL)    GFR calc non Af Amer 17 (*) >90 (mL/min)  GFR calc Af Amer 20 (*) >90 (mL/min)   CBC     Status: Abnormal   Collection Time   08/03/11  5:10 AM      Component Value Range Comment   WBC 10.7 (*) 4.0 - 10.5 (K/uL)    RBC 2.75 (*) 4.22 - 5.81 (MIL/uL)    Hemoglobin 7.3 (*) 13.0 - 17.0 (g/dL)    HCT 24.8 (*) 39.0 - 52.0 (%)    MCV 90.2  78.0 - 100.0 (fL)    MCH 26.5  26.0 - 34.0 (pg)    MCHC 29.4 (*) 30.0 - 36.0 (g/dL)    RDW 19.4 (*) 11.5 - 15.5 (%)    Platelets 351  150 - 400 (K/uL)   GLUCOSE, CAPILLARY     Status: Abnormal   Collection Time   08/03/11  7:32 AM      Component Value Range Comment   Glucose-Capillary 335 (*) 70 - 99 (mg/dL)   TYPE AND SCREEN     Status: Normal (Preliminary result)   Collection Time   08/03/11  9:10 AM      Component Value Range Comment   ABO/RH(D) A POS      Antibody Screen NEG      Sample Expiration 08/06/2011      Unit Number [id number]      Blood Component Type RED CELLS,LR      Unit division 00      Status of Unit ALLOCATED      Transfusion Status OK TO TRANSFUSE      Crossmatch Result Compatible      Unit Number [id number]      Blood Component Type RED CELLS,LR      Unit division 00      Status of Unit ALLOCATED      Transfusion Status OK TO TRANSFUSE      Crossmatch Result Compatible     PREPARE RBC (CROSSMATCH)     Status: Normal   Collection Time   08/03/11  9:10 AM      Component Value Range Comment   Order Confirmation ORDER PROCESSED BY BLOOD BANK     GLUCOSE, CAPILLARY     Status: Abnormal   Collection Time   08/03/11 12:24 PM      Component Value Range Comment   Glucose-Capillary 226 (*) 70 - 99 (mg/dL)   GLUCOSE, CAPILLARY     Status: Abnormal   Collection Time   08/03/11  4:33 PM      Component Value Range Comment    Glucose-Capillary 157 (*) 70 - 99 (mg/dL)       Component Value Date/Time   SDES STOOL 07/24/2011 1455   SPECREQUEST NONE 07/24/2011 1455   CULT NO GROWTH 5 DAYS 04/06/2008 2100   REPTSTATUS 07/26/2011 FINAL 07/24/2011 1455   Dg Chest 2 View  08/02/2011  *RADIOLOGY REPORT*  Clinical Data: History of difficulty breathing.  History of fever.  CHEST - 2 VIEW  Comparison: 07/22/2011.  08/01/2011.  Findings: Right sided dialysis catheter has tips in right atrial region.  There is stable cardiac silhouette enlargement.  There is upper lobe vascular prominence with pulmonary venous hypertension vascular congestion pattern.  No consolidation or definite pleural effusion is seen.  There is slight elevation of the margin of the left hemidiaphragm with minimal basilar atelectasis. On the lateral image there appears to be some posterior atelectasis and patchy infiltrative density without consolidation.  There is minimal degenerative spondylosis.  IMPRESSION: Dialysis catheter in place.  Stable cardiac  silhouette enlargement. Pulmonary venous hypertension vascular congestion pattern.  No consolidation or pleural effusion. Left basilar and posterior atelectasis and patchy infiltrative densities without consolidation.  Original Report Authenticated By: Delane Ginger, M.D.   Dg Chest Port 1 View  08/03/2011  *RADIOLOGY REPORT*  Clinical Data: Cough and shortness of breath.  PORTABLE CHEST - 1 VIEW  Comparison: 08/12/2011.  Findings: The diatek catheter is stable.  The heart is enlarged but stable.  The lungs are better aerated with resolving edema.  No effusions.  IMPRESSION: Slight improved lung aeration.  Vascular congestion without pulmonary edema.  Original Report Authenticated By: P. Kalman Jewels, M.D.     Recent Results (from the past 720 hour(s))  MRSA PCR SCREENING     Status: Normal   Collection Time   07/24/11  5:30 AM      Component Value Range Status Comment   MRSA by PCR NEGATIVE  NEGATIVE  Final     CLOSTRIDIUM DIFFICILE BY PCR     Status: Normal   Collection Time   07/24/11  2:55 PM      Component Value Range Status Comment   C difficile by pcr NEGATIVE  NEGATIVE  Final      Impression/Recommendation  46 year old renal transplant with several week history of low grade temperatures and temperature spike to 101 after HD catheter placement.  1)FUO: Low grade though steroids could be blunting this. Agree with blood cultures, urine cultures reasonable though no clearcut symptoms.   --I will check HIV ab, viral hep panel, ESR, CRP, LDH --I will also order CT abdomen and pelvis with oral and IV contrast for the am (if iv contrast ok with nephrology)   Thank you so much for this interesting consult,   [name] 08/03/2011, 6:20 PM   [phone] (pager) [phone] (office)

## 2011-08-03 NOTE — Progress Notes (Addendum)
Inpatient Diabetes Program Recommendations  AACE/ADA: New Consensus Statement on Inpatient Glycemic Control (2009)  Target Ranges:  Prepandial:   less than 140 mg/dL      Peak postprandial:   less than 180 mg/dL (1-2 hours)      Critically ill patients:  140 - 180 mg/dL   Reason for Visit: Fasting hyperglycemia in 300's (Last HS cbg of 165)  Results for Dale [name], Dale [name] (MRN [id number]) as of 08/03/2011 12:06  Ref. Range 08/02/2011 03:40 08/02/2011 08:01 08/02/2011 12:58 08/02/2011 21:03   Glucose-Capillary Latest Range: 70-99 mg/dL 316 (H) 294 (H) 329 (H) 165 (H)    08/03/2011 07:32  335 (H)    Inpatient Diabetes Program Recommendations Insulin - Basal: Lantus changed from 30 units at HS to 15 units bid (no increase in Lantus).  Please increase to 20 units bid. Correction (SSI): continue current order Insulin - Meal Coverage: Increase to 8 units tidwc    Note: Pt does not want to go back on his insulin pump, but wants to wait until he goes home.  Thank you, Rosita Kea, RN, CNS, Diabetes Coordinator [phone])

## 2011-08-03 NOTE — Progress Notes (Signed)
Dale [name] [id number] is a 46 y.o. male,  Outpatient Primary MD for the patient is No primary provider on file.  Chief Complaint  Patient presents with  . Weakness  . Shortness of Breath        Subjective:   Dale [name] today has, No headache, No chest pain, No abdominal pain - No Nausea, No new weakness tingling or numbness, some intermittent Cough - No SOB.   Objective:   Filed Vitals:   08/02/11 1830 08/02/11 1853 08/02/11 2105 08/03/11 0523  BP: 113/59 129/63 129/56 129/59  Pulse: 91 93 93 93  Temp:  98.1 F (36.7 C) 100 F (37.8 C) 99.5 F (37.5 C)  TempSrc:  Oral Oral Oral  Resp: 20 24 21 21   Height:      Weight:  117 kg (257 lb 15 oz) 118.2 kg (260 lb 9.3 oz)   SpO2:   100% 99%    Wt Readings from Last 3 Encounters:  08/02/11 118.2 kg (260 lb 9.3 oz)  08/02/11 118.2 kg (260 lb 9.3 oz)  08/02/11 118.2 kg (260 lb 9.3 oz)     Intake/Output Summary (Last 24 hours) at 08/03/11 0840 Last data filed at 08/03/11 0200  Gross per 24 hour  Intake    120 ml  Output   3801 ml  Net  -3681 ml    Exam Awake mild delirium No new F.N deficits, Normal affect [place].AT,PERRAL Supple Neck,No JVD, No cervical lymphadenopathy appriciated.  Symmetrical Chest wall movement, Good air movement bilaterally, CTAB RRR,No Gallops,Rubs or new Murmurs, No Parasternal Heave +ve B.Sounds, Abd Soft, Non tender, No organomegaly appriciated, No rebound -guarding or rigidity. 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   Data Review  CBC  Lab 08/03/11 0510 08/02/11 1531 08/01/11 0455 07/31/11 0457 07/30/11 0445  WBC 10.7* 12.4* 10.1 9.8 10.0  HGB 7.3* 7.4* 7.5* 7.6* 6.9*  HCT 24.8* 24.7* 24.5* 25.0* 22.8*  PLT 351 429* 403* 399 408*  MCV 90.2 88.5 88.8 88.7 89.8  MCH 26.5 26.5 27.2 27.0 27.2  MCHC 29.4* 30.0 30.6 30.4 30.3  RDW 19.4* 19.5* 19.6* 19.5* 20.0*  LYMPHSABS -- -- -- -- --  MONOABS -- -- -- -- --  EOSABS -- -- -- -- --  BASOSABS -- -- -- -- --  BANDABS -- -- -- -- --     Chemistries   Lab 08/03/11 0510 08/02/11 1531 08/01/11 1846 08/01/11 0455 07/31/11 1500  NA 132* 133* 133* 129* 130*  K 3.9 3.8 4.8 4.5 5.6*  CL 94* 97 96 94* 95*  CO2 25 23 20 20 20   GLUCOSE 333* 263* 173* 289* 183*  BUN 66* 107* 137* 139* 137*  CREATININE 3.90* 4.64* 4.59* 4.81* 4.74*  CALCIUM 8.7 8.8 9.2 8.9 9.4  MG -- -- -- -- --  AST -- -- -- -- --  ALT -- -- -- -- --  ALKPHOS -- -- -- -- --  BILITOT -- -- -- -- --   ------------------------------------------------------------------------------------------------------------------ estimated creatinine clearance is 29.9 ml/min (by C-G formula based on Cr of 3.9). ------------------------------------------------------------------------------------------------------------------ No results found for this basename: HGBA1C:2 in the last 72 hours ------------------------------------------------------------------------------------------------------------------ No results found for this basename: CHOL:2,HDL:2,LDLCALC:2,TRIG:2,CHOLHDL:2,LDLDIRECT:2 in the last 72 hours ------------------------------------------------------------------------------------------------------------------ No results found for this basename: TSH,T4TOTAL,FREET3,T3FREE,THYROIDAB in the last 72 hours ------------------------------------------------------------------------------------------------------------------  [facility] 08/01/11 1923  VITAMINB12 --  FOLATE --  FERRITIN 2190*  TIBC 103*  IRON 11*  RETICCTPCT --    Coagulation profile No results found for this basename: INR:5,PROTIME:5 in the  last 168 hours  No results found for this basename: DDIMER:2 in the last 72 hours  Cardiac Enzymes No results found for this basename: CK:3,CKMB:3,TROPONINI:3,MYOGLOBIN:3 in the last 168 hours ------------------------------------------------------------------------------------------------------------------ No components found with this basename:  POCBNP:3  Micro Results Recent Results (from the past 240 hour(s))  CLOSTRIDIUM DIFFICILE BY PCR     Status: Normal   Collection Time   07/24/11  2:55 PM      Component Value Range Status Comment   C difficile by pcr NEGATIVE  NEGATIVE  Final     Radiology Reports Dg Chest 2 View  08/02/2011  *RADIOLOGY REPORT*  Clinical Data: History of difficulty breathing.  History of fever.  CHEST - 2 VIEW  Comparison: 07/22/2011.  08/01/2011.  Findings: Right sided dialysis catheter has tips in right atrial region.  There is stable cardiac silhouette enlargement.  There is upper lobe vascular prominence with pulmonary venous hypertension vascular congestion pattern.  No consolidation or definite pleural effusion is seen.  There is slight elevation of the margin of the left hemidiaphragm with minimal basilar atelectasis. On the lateral image there appears to be some posterior atelectasis and patchy infiltrative density without consolidation.  There is minimal degenerative spondylosis.  IMPRESSION: Dialysis catheter in place.  Stable cardiac silhouette enlargement. Pulmonary venous hypertension vascular congestion pattern.  No consolidation or pleural effusion. Left basilar and posterior atelectasis and patchy infiltrative densities without consolidation.  Original Report Authenticated By: Delane Ginger, M.D.   Dg Chest 2 View  07/22/2011  *RADIOLOGY REPORT*  Clinical Data: Fever for 1 week with cough, shortness of breath, hypertension and diabetes  CHEST - 2 VIEW  Comparison: 10/19/2004  Findings: Heart and mediastinal contours are stable with an unchanged degree of mild cardiomegaly.  The lung fields are notable for mild pulmonary vascular congestion.  No overt congestive failure, interstitial septal lines or significant peribronchial cuffing is seen to suggest early interstitial edema. A slight increase in interstitial markings are seen diffusely and may indicate underlying viral pneumonitis.  No focal infiltrates  are seen to suggest an area of focal bronchopneumonia.  No pleural fluid is seen.  Bony structures appear intact.  IMPRESSION: Mild pulmonary vascular congestion and mild increase in interstitial markings suspicious for viral pneumonitis given history of fever.  No findings suggestive of bronchopneumonia.  Original Report Authenticated By: Ander Gaster, M.D.   US Abdomen Limited  07/14/2011  *RADIOLOGY REPORT*  Clinical Data:  Elevated liver function tests, history of renal transplantation  LIMITED ABDOMINAL ULTRASOUND - RIGHT UPPER QUADRANT  Comparison:  CT abdomen of 08/26/2004  Findings:  Gallbladder:  The gallbladder is visualized and no gallstones are noted.  There is no pain over the gallbladder with compression.  Common bile duct:  The common bile duct is normal measuring 5.0 mm in diameter.  Liver:  The liver is echogenic consistent with fatty infiltration. Also the liver measures 20.1 cm sagittally indicating hepatomegaly. No ductal dilatation is seen.  IMPRESSION:  1.  Fatty infiltration of the liver.  Hepatomegaly. 2.  No gallstones.                   Original Report Authenticated By: Joretta Bachelor, M.D.   Dg Chest Port 1 View  08/01/2011  *RADIOLOGY REPORT*  Clinical Data: Central line placement.  PORTABLE CHEST - 1 VIEW  Comparison: 07/22/2011.  Findings: 1504 hours.  Right IJ dialysis catheter tips are present within the right atrium.  There is stable cardiomegaly with chronic vascular  congestion.  No confluent airspace opacity, significant pleural effusion or pneumothorax is seen.  IMPRESSION: Dialysis catheter placement without demonstrated pneumothorax. Stable cardiomegaly and vascular congestion.  Original Report Authenticated By: Vivia Ewing, M.D.     Scheduled Meds:   . acetaminophen      . benzonatate  100 mg Oral TID  . calcium acetate  1,334 mg Oral TID WC  . cycloSPORINE modified  100 mg Oral BID  . darbepoetin (ARANESP) injection - DIALYSIS  150 mcg Intravenous Q  Tue-HD  . insulin aspart  0-5 Units Subcutaneous QHS  . insulin aspart  0-9 Units Subcutaneous TID WC  . insulin aspart  6 Units Subcutaneous TID WC  . insulin glargine  30 Units Subcutaneous QHS  . isosorbide mononitrate  60 mg Oral Daily  . labetalol  300 mg Oral BID  . levothyroxine  75 mcg Oral QAC breakfast  . metoCLOPramide  5 mg Oral TID PC & HS  . multivitamin  1 tablet Oral Daily  . pantoprazole  40 mg Oral Q1200  . paricalcitol      . paricalcitol  2 mcg Intravenous Q T,Th,Sa-HD  . predniSONE  5 mg Oral Q breakfast  . rOPINIRole  1 mg Oral QHS  . sodium chloride  3 mL Intravenous Q12H  . DISCONTD: calcium acetate  667 mg Oral TID WC   Continuous Infusions:  PRN Meds:.acetaminophen, acetaminophen, albuterol, ALPRAZolam, alum & mag hydroxide-simeth, guaiFENesin-dextromethorphan, heparin, heparin, HYDROcodone-acetaminophen, HYDROmorphone, menthol-cetylpyridinium, ondansetron (ZOFRAN) IV, ondansetron, promethazine  Assessment & Plan   45 year old man presented to short stay for iron infusion. Was found to have a hemoglobin of 6.7 and referred to the emergency department. There is also some concern of fever upon admission thought to be Viral URI at that time, however fever resolved immediately an initial infectious workup was unremarkable. He has Anemia (AOCD) and had PRBC transfusion initially,  he remained hospitalized because of worsening renal failure - when this failed to significantly improved the patient underwent AV graft placement and temporary dialysis catheter placement and was started on hemodialysis April 8. He developed fever and April 8 for the first time since admission. Disposition is pending resolution of fever an outpatient establishment of hemodialysis.     Past medical history:  End-stage renal disease secondary to diabetic nephropathy, status post renal transplant 1995, refractory anemia (on Procrit and iron infusion as an outpatient), fibromyalgia.      Consultants:  Nephrology  Vascular surgery   Procedures:  March 30: 2-D echocardiogram: Left ventricular ejection fraction 55-60%. Normal wall motion.  March 29: Transfused 2 units packed blood cells.  April 4: Right arm and central venogram  April 5: Creation of right brachial artery to cephalic vein AV fistula  April 5: Transfusion one unit packed red blood cells.  April 8: Right IJ hemodialysis catheter    1. Fever: Resolved since admission until recurrence April 8 in the evening. Etiology unclear. On initial presentation it was presumed that his fever was secondary to viral pneumonitis. Influenza was negative. No fever recurred until April 8. Further infectious workup will be conducted including chest x-ray and urinalysis ordered again, B Cult negative.     2. Acute/chronic renal failure: Hemodialysis initiated April 8. Status post AV fistula April 5. Repeat basic metabolic panel.     3. Hyperkalemia: Resolved. Secondary to acute renal failure. Not on potassium supplementation, heparin products or ACE-I/ARB.      4. Refractory anemia: Stable. Fecal occult blood negative. Status post total  of 3 units packed red blood cells since admission.will give another 2 units with next dialysis.    5. Aspirin remains on hold. Management per nephrology (followed and managed as an outpatient by Dr. Mercy Moore).      6. Diabetes mellitus: Stable. Adjusted Lantus and ISS, closely monitor PM CBGs  CBG (last 3)   Basename 08/03/11 0732 08/02/11 2103 08/02/11 1258  GLUCAP 335* 165* 329*       7. History of renal transplantation with allograft nephropathy: Per nephrology. Continue cyclosporin and prednisone.      8. Obstructive sleep apnea: Patient refuses CPAP.On and off confused in am likely retains, have counseled to start wearing it, will order QHS CPAP.     9. Mild delirium - as in #8, check CT brain, UA, CXR. + QHS CPAP    DVT Prophylaxis    SCDs     [name] M.D on 08/03/2011 at 8:40 AM  [place]  [phone]

## 2011-08-03 NOTE — Progress Notes (Signed)
[facility]  Subjective:  Asleep, but awakens easily.  Dialyzed 8 and 9 Apr Weight 123.8kg  on 7 Apr, 120.6 on 9 Apr, 118.2 today   Objective: Vital signs in last 24 hours: Blood pressure 116/59, pulse 90, temperature 98.3 F (36.8 C), temperature source Oral, resp. rate 22, height 5\' 8"  (1.727 m), weight 118.2 kg (260 lb 9.3 oz), SpO2 98.00%.    PHYSICAL EXAM General--as above Chest--cath R IJ look ok, clear Heart--no rub Abd--nontender, allograft in LLQ Extr--L BKA, AVF R upper arm patent, slight erythema  Lab Results:   Lab 08/03/11 0510 08/02/11 1531 08/01/11 1846 07/30/11 0430 07/28/11 0540  NA 132* 133* 133* -- --  K 3.9 3.8 4.8 -- --  CL 94* 97 96 -- --  CO2 25 23 20  -- --  BUN 66* 107* 137* -- --  CREATININE 3.90* 4.64* 4.59* -- --  ALB -- -- -- -- --  GLUCOSE 333* -- -- -- --  CALCIUM 8.7 8.8 9.2 -- --  PHOS -- -- 8.5* 7.2* 7.0*     Basename 08/03/11 0510 08/02/11 1531  WBC 10.7* 12.4*  HGB 7.3* 7.4*  HCT 24.8* 24.7*  PLT 351 429*   Assessment/Plan: 1. Failing renal transplant--continue anti-rejection meds (cyclosporine & prednisone)  2. Fluid overload--HD again tomorrow--try for weight 115 kg  3. CKD--"  4. DM-continue insulin, CBG  5. Secondary PTH-- PTH 172.4, phosphorus 8.5. Increase phosLo to 2 TID. Was on zemplar 1 mcg/d PTA. Will begin 2 mcg each HD  6. Anemia-- FeTIBC 11 %, but ferritin 2190. Begin aranesp 150/wk.  May need PRBC if hgb drops< 7  7. High BP-- reduce labetalol to 100 BID    LOS: 12 days   Laiana Fratus F 08/03/2011,11:52 AM   .labalb

## 2011-08-04 ENCOUNTER — Inpatient Hospital Stay (HOSPITAL_COMMUNITY): Payer: Medicare Other

## 2011-08-04 LAB — CBC
HCT: 23.3 % — ABNORMAL LOW (ref 39.0–52.0)
Hemoglobin: 7 g/dL — ABNORMAL LOW (ref 13.0–17.0)
MCH: 26.3 pg (ref 26.0–34.0)
MCHC: 30 g/dL (ref 30.0–36.0)
MCV: 87.6 fL (ref 78.0–100.0)
Platelets: 349 10*3/uL (ref 150–400)
RBC: 2.66 MIL/uL — ABNORMAL LOW (ref 4.22–5.81)
RDW: 19.1 % — ABNORMAL HIGH (ref 11.5–15.5)
WBC: 11.9 10*3/uL — ABNORMAL HIGH (ref 4.0–10.5)

## 2011-08-04 LAB — RENAL FUNCTION PANEL
Albumin: 1.2 g/dL — ABNORMAL LOW (ref 3.5–5.2)
BUN: 84 mg/dL — ABNORMAL HIGH (ref 6–23)
CO2: 23 mEq/L (ref 19–32)
Calcium: 8.7 mg/dL (ref 8.4–10.5)
Chloride: 94 mEq/L — ABNORMAL LOW (ref 96–112)
Creatinine, Ser: 5.4 mg/dL — ABNORMAL HIGH (ref 0.50–1.35)
GFR calc Af Amer: 13 mL/min — ABNORMAL LOW (ref >90)
GFR calc non Af Amer: 12 mL/min — ABNORMAL LOW (ref >90)
Glucose, Bld: 51 mg/dL — ABNORMAL LOW (ref 70–99)
Phosphorus: 8 mg/dL — ABNORMAL HIGH (ref 2.3–4.6)
Potassium: 3.5 mEq/L (ref 3.5–5.1)
Sodium: 131 mEq/L — ABNORMAL LOW (ref 135–145)

## 2011-08-04 LAB — GLUCOSE, CAPILLARY
Glucose-Capillary: 120 mg/dL — ABNORMAL HIGH (ref 70–99)
Glucose-Capillary: 120 mg/dL — ABNORMAL HIGH (ref 70–99)
Glucose-Capillary: 115 mg/dL — ABNORMAL HIGH (ref 70–99)
Glucose-Capillary: 107 mg/dL — ABNORMAL HIGH (ref 70–99)
Glucose-Capillary: 98 mg/dL (ref 70–99)
Glucose-Capillary: 117 mg/dL — ABNORMAL HIGH (ref 70–99)
Glucose-Capillary: 186 mg/dL — ABNORMAL HIGH (ref 70–99)
Glucose-Capillary: 131 mg/dL — ABNORMAL HIGH (ref 70–99)

## 2011-08-04 LAB — URINE MICROSCOPIC-ADD ON

## 2011-08-04 LAB — URINALYSIS, ROUTINE W REFLEX MICROSCOPIC
Glucose, UA: 100 mg/dL — AB
Hgb urine dipstick: NEGATIVE
Ketones, ur: 15 mg/dL — AB
Nitrite: POSITIVE — AB
Protein, ur: 100 mg/dL — AB
Specific Gravity, Urine: 1.024 (ref 1.005–1.030)
Urobilinogen, UA: 1 mg/dL (ref 0.0–1.0)
pH: 5 (ref 5.0–8.0)

## 2011-08-04 LAB — SEDIMENTATION RATE: Sed Rate: >140 mm/hr — ABNORMAL HIGH (ref 0–16)

## 2011-08-04 LAB — C-REACTIVE PROTEIN: CRP: 31.12 mg/dL — ABNORMAL HIGH (ref <0.60)

## 2011-08-04 MED ORDER — LORAZEPAM 2 MG/ML IJ SOLN
1.0000 mg | Freq: Once | INTRAMUSCULAR | Status: AC
  Administered 2011-08-04: 1 mg via INTRAVENOUS
  Filled 2011-08-04: qty 1

## 2011-08-04 MED ORDER — PARICALCITOL 5 MCG/ML IV SOLN
INTRAVENOUS | Status: AC
  Filled 2011-08-04: qty 1

## 2011-08-04 NOTE — Progress Notes (Signed)
OT Cancellation Note  Treatment cancelled today due to patient receiving procedure or test--hemodialysis.  Almon Register [id number] 08/04/2011, 12:57 PM

## 2011-08-04 NOTE — Progress Notes (Signed)
Dale [name] [id number] is a 46 y.o. male,  Outpatient Primary MD for the patient is No primary provider on file.  Chief Complaint  Patient presents with  . Weakness  . Shortness of Breath        Subjective:   Dale [name] today has, No headache, No chest pain, No abdominal pain - No Nausea, No new weakness tingling or numbness, some intermittent Cough - No SOB.   Objective:   Filed Vitals:   08/04/11 1208 08/04/11 1215 08/04/11 1225 08/04/11 1230  BP: 132/58 130/61 141/57 136/64  Pulse: 89 89 89 89  Temp: 98.1 F (36.7 C) 99.1 F (37.3 C) 99.3 F (37.4 C)   TempSrc: Oral Oral Oral   Resp: 23 26 21 24   Height:      Weight:      SpO2:        Wt Readings from Last 3 Encounters:  08/04/11 119.3 kg (263 lb 0.1 oz)  08/04/11 119.3 kg (263 lb 0.1 oz)  08/04/11 119.3 kg (263 lb 0.1 oz)     Intake/Output Summary (Last 24 hours) at 08/04/11 1307 Last data filed at 08/04/11 1225  Gross per 24 hour  Intake   1065 ml  Output    150 ml  Net    915 ml    Exam Awake mild delirium No new F.N deficits, Normal affect [name].AT,PERRAL Supple Neck,No JVD, No cervical lymphadenopathy appriciated.  Symmetrical Chest wall movement, Good air movement bilaterally, CTAB RRR,No Gallops,Rubs or new Murmurs, No Parasternal Heave +ve B.Sounds, Abd Soft, Non tender, No organomegaly appriciated, No rebound -guarding or rigidity. 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   Data Review  CBC  Lab 08/04/11 1020 08/03/11 0510 08/02/11 1531 08/01/11 0455 07/31/11 0457  WBC 11.9* 10.7* 12.4* 10.1 9.8  HGB 7.0* 7.3* 7.4* 7.5* 7.6*  HCT 23.3* 24.8* 24.7* 24.5* 25.0*  PLT 349 351 429* 403* 399  MCV 87.6 90.2 88.5 88.8 88.7  MCH 26.3 26.5 26.5 27.2 27.0  MCHC 30.0 29.4* 30.0 30.6 30.4  RDW 19.1* 19.4* 19.5* 19.6* 19.5*  LYMPHSABS -- -- -- -- --  MONOABS -- -- -- -- --  EOSABS -- -- -- -- --  BASOSABS -- -- -- -- --  BANDABS -- -- -- -- --    Chemistries   Lab 08/04/11 1020 08/03/11 0510  08/02/11 1531 08/01/11 1846 08/01/11 0455  NA 131* 132* 133* 133* 129*  K 3.5 3.9 3.8 4.8 4.5  CL 94* 94* 97 96 94*  CO2 23 25 23 20 20   GLUCOSE 51* 333* 263* 173* 289*  BUN 84* 66* 107* 137* 139*  CREATININE 5.40* 3.90* 4.64* 4.59* 4.81*  CALCIUM 8.7 8.7 8.8 9.2 8.9  MG -- -- -- -- --  AST -- -- -- -- --  ALT -- -- -- -- --  ALKPHOS -- -- -- -- --  BILITOT -- -- -- -- --   ------------------------------------------------------------------------------------------------------------------ estimated creatinine clearance is 21.7 ml/min (by C-G formula based on Cr of 5.4). ------------------------------------------------------------------------------------------------------------------ No results found for this basename: HGBA1C:2 in the last 72 hours ------------------------------------------------------------------------------------------------------------------ No results found for this basename: CHOL:2,HDL:2,LDLCALC:2,TRIG:2,CHOLHDL:2,LDLDIRECT:2 in the last 72 hours ------------------------------------------------------------------------------------------------------------------ No results found for this basename: TSH,T4TOTAL,FREET3,T3FREE,THYROIDAB in the last 72 hours ------------------------------------------------------------------------------------------------------------------  [facility] 08/01/11 1923  VITAMINB12 --  FOLATE --  FERRITIN 2190*  TIBC 103*  IRON 11*  RETICCTPCT --    Coagulation profile No results found for this basename: INR:5,PROTIME:5 in the last 168 hours  No results found for  this basename: DDIMER:2 in the last 72 hours  Cardiac Enzymes No results found for this basename: CK:3,CKMB:3,TROPONINI:3,MYOGLOBIN:3 in the last 168 hours ------------------------------------------------------------------------------------------------------------------ No components found with this basename: POCBNP:3  Micro Results Recent Results (from the past 240 hour(s))    CULTURE, BLOOD (ROUTINE X 2)     Status: Normal (Preliminary result)   Collection Time   08/02/11  3:52 PM      Component Value Range Status Comment   Specimen Description BLOOD HEMODIALYSIS CATHETER   Final    Special Requests     Final    Value: BOTTLES DRAWN AEROBIC AND ANAEROBIC 10CC RIGHT IJ CATH   Culture  Setup Time [id number]   Final    Culture     Final    Value:        BLOOD CULTURE RECEIVED NO GROWTH TO DATE CULTURE WILL BE HELD FOR 5 DAYS BEFORE ISSUING A FINAL NEGATIVE REPORT   Report Status PENDING   Incomplete   CULTURE, BLOOD (ROUTINE X 2)     Status: Normal (Preliminary result)   Collection Time   08/02/11  5:40 PM      Component Value Range Status Comment   Specimen Description BLOOD HEMODIALYSIS CATHETER   Final    Special Requests BOTTLES DRAWN AEROBIC AND ANAEROBIC 10CC RIGHT IJ   Final    Culture  Setup Time [id number]   Final    Culture     Final    Value:        BLOOD CULTURE RECEIVED NO GROWTH TO DATE CULTURE WILL BE HELD FOR 5 DAYS BEFORE ISSUING A FINAL NEGATIVE REPORT   Report Status PENDING   Incomplete     Radiology Reports Dg Chest 2 View  08/02/2011  *RADIOLOGY REPORT*  Clinical Data: History of difficulty breathing.  History of fever.  CHEST - 2 VIEW  Comparison: 07/22/2011.  08/01/2011.  Findings: Right sided dialysis catheter has tips in right atrial region.  There is stable cardiac silhouette enlargement.  There is upper lobe vascular prominence with pulmonary venous hypertension vascular congestion pattern.  No consolidation or definite pleural effusion is seen.  There is slight elevation of the margin of the left hemidiaphragm with minimal basilar atelectasis. On the lateral image there appears to be some posterior atelectasis and patchy infiltrative density without consolidation.  There is minimal degenerative spondylosis.  IMPRESSION: Dialysis catheter in place.  Stable cardiac silhouette enlargement. Pulmonary venous hypertension vascular congestion  pattern.  No consolidation or pleural effusion. Left basilar and posterior atelectasis and patchy infiltrative densities without consolidation.  Original Report Authenticated By: Delane Ginger, M.D.   Dg Chest 2 View  07/22/2011  *RADIOLOGY REPORT*  Clinical Data: Fever for 1 week with cough, shortness of breath, hypertension and diabetes  CHEST - 2 VIEW  Comparison: 10/19/2004  Findings: Heart and mediastinal contours are stable with an unchanged degree of mild cardiomegaly.  The lung fields are notable for mild pulmonary vascular congestion.  No overt congestive failure, interstitial septal lines or significant peribronchial cuffing is seen to suggest early interstitial edema. A slight increase in interstitial markings are seen diffusely and may indicate underlying viral pneumonitis.  No focal infiltrates are seen to suggest an area of focal bronchopneumonia.  No pleural fluid is seen.  Bony structures appear intact.  IMPRESSION: Mild pulmonary vascular congestion and mild increase in interstitial markings suspicious for viral pneumonitis given history of fever.  No findings suggestive of bronchopneumonia.  Original Report Authenticated By: Ander Gaster,  M.D.   US Abdomen Limited  07/14/2011  *RADIOLOGY REPORT*  Clinical Data:  Elevated liver function tests, history of renal transplantation  LIMITED ABDOMINAL ULTRASOUND - RIGHT UPPER QUADRANT  Comparison:  CT abdomen of 08/26/2004  Findings:  Gallbladder:  The gallbladder is visualized and no gallstones are noted.  There is no pain over the gallbladder with compression.  Common bile duct:  The common bile duct is normal measuring 5.0 mm in diameter.  Liver:  The liver is echogenic consistent with fatty infiltration. Also the liver measures 20.1 cm sagittally indicating hepatomegaly. No ductal dilatation is seen.  IMPRESSION:  1.  Fatty infiltration of the liver.  Hepatomegaly. 2.  No gallstones.                   Original Report Authenticated By: Joretta Bachelor, M.D.   Dg Chest Port 1 View  08/01/2011  *RADIOLOGY REPORT*  Clinical Data: Central line placement.  PORTABLE CHEST - 1 VIEW  Comparison: 07/22/2011.  Findings: 1504 hours.  Right IJ dialysis catheter tips are present within the right atrium.  There is stable cardiomegaly with chronic vascular congestion.  No confluent airspace opacity, significant pleural effusion or pneumothorax is seen.  IMPRESSION: Dialysis catheter placement without demonstrated pneumothorax. Stable cardiomegaly and vascular congestion.  Original Report Authenticated By: Vivia Ewing, M.D.     Scheduled Meds:    . benzonatate  100 mg Oral TID  . calcium acetate  1,334 mg Oral TID WC  . cycloSPORINE modified  100 mg Oral BID  . darbepoetin (ARANESP) injection - DIALYSIS  150 mcg Intravenous Q Tue-HD  . insulin aspart  0-15 Units Subcutaneous TID WC  . insulin aspart  8 Units Subcutaneous TID WC  . insulin glargine  15 Units Subcutaneous BID  . isosorbide mononitrate  60 mg Oral Daily  . labetalol  100 mg Oral BID  . levothyroxine  75 mcg Oral QAC breakfast  . metoCLOPramide  5 mg Oral TID PC & HS  . multivitamin  1 tablet Oral Daily  . pantoprazole  40 mg Oral Q1200  . paricalcitol      . paricalcitol  2 mcg Intravenous Q T,Th,Sa-HD  . predniSONE  5 mg Oral Q breakfast  . rOPINIRole  1 mg Oral QHS  . sodium chloride  3 mL Intravenous Q12H  . DISCONTD: insulin aspart  6 Units Subcutaneous TID WC   Continuous Infusions:  PRN Meds:.acetaminophen, acetaminophen, albuterol, ALPRAZolam, alum & mag hydroxide-simeth, guaiFENesin-dextromethorphan, heparin, heparin, heparin, HYDROcodone-acetaminophen, HYDROmorphone, menthol-cetylpyridinium, ondansetron (ZOFRAN) IV, ondansetron, promethazine  Assessment & Plan   46 year old man presented to short stay for iron infusion. Was found to have a hemoglobin of 6.7 and referred to the emergency department. There is also some concern of fever upon admission thought to  be Viral URI at that time, however fever resolved immediately an initial infectious workup was unremarkable. He has Anemia (AOCD) and had PRBC transfusion initially,  he remained hospitalized because of worsening renal failure - when this failed to significantly improved the patient underwent AV graft placement and temporary dialysis catheter placement and was started on hemodialysis April 8. He developed fever and April 8 for the first time since admission. Disposition is pending resolution of fever an outpatient establishment of hemodialysis.     Past medical history:  End-stage renal disease secondary to diabetic nephropathy, status post renal transplant 1995, refractory anemia (on Procrit and iron infusion as an outpatient), fibromyalgia.     Consultants:  Nephrology  Vascular surgery   Procedures:  March 30: 2-D echocardiogram: Left ventricular ejection fraction 55-60%. Normal wall motion.  March 29: Transfused 2 units packed blood cells.  April 4: Right arm and central venogram  April 5: Creation of right brachial artery to cephalic vein AV fistula  April 5: Transfusion one unit packed red blood cells.  April 8: Right IJ hemodialysis catheter    1. Fever: Resolved since admission until recurrence April 8 in the evening. Etiology unclear. On initial presentation it was presumed that his fever was secondary to viral pneumonitis. Influenza was negative. No fever recurred until April 8.  Appreciate Dr. Tommy Medal infectious disease input, patient to get CT scan abdomen pelvis today without contrast. disease input, B Cult negative.     2. Acute/chronic renal failure: Hemodialysis initiated April 8. Status post AV fistula April 5. Repeat basic metabolic panel.     3. Hyperkalemia: Resolved. Secondary to acute renal failure. Not on potassium supplementation, heparin products or ACE-I/ARB.      4. Refractory anemia: Stable. Fecal occult blood negative. Status post total of 3 units  packed red blood cells since admission.will give another 2 units with next dialysis.    5. Aspirin remains on hold. Management per nephrology (followed and managed as an outpatient by Dr. Mercy Moore).      6. Diabetes mellitus: Stable. Adjusted Lantus to twice a day dose with good effect and ISS, closely monitor PM CBGs  CBG (last 3)   Basename 08/04/11 0750 08/04/11 0552 08/04/11 0358  GLUCAP 98 107* 115*       7. History of renal transplantation with allograft nephropathy: Per nephrology. Continue cyclosporin and prednisone.      8. Obstructive sleep apnea: Patient refuses CPAP.On and off confused in am likely retains, have counseled to start wearing it, will order QHS CPAP.     9. Mild delirium - as in #8, patient refused CT brain, stable UA, CXR. + QHS CPAP encouraged again.    DVT Prophylaxis   SCDs     [name] M.D on 08/04/2011 at 1:07 PM  [facility] Office  [phone]

## 2011-08-04 NOTE — Progress Notes (Signed)
Pt was placed on CPAP machine around 2300. RT notified to assist with his home CPAP at this time because pt states it 'started feeling different'. Pt is using nasal prongs and sleeping with mouth open. He complains of tongue and inner jaw pain and swelling from the pressure of CPAP machine. Educated pt about the use of different masks or a chin strap. The chin strap is the only option pt is willing to try at this point, however, we do not carry those at the hospital. Pt notified to speak with home-care company about the chin strap. Pt taken off CPAP and placed on 2L [place], tolerating well.

## 2011-08-04 NOTE — Progress Notes (Signed)
Patient refused to sleep with his CPAP machine tonight.  He complained that his home machine still does not fit him right after getting a new chin strap today, and he does not want another machine from the [facility] he would rather sleep with O2 tonight.  O2 sat remain at 96% on 2L.  Will continue to monitor.

## 2011-08-04 NOTE — Progress Notes (Signed)
Pt off unit

## 2011-08-04 NOTE — Progress Notes (Signed)
Subjective: No new complaints   Antibiotics:  Anti-infectives     Start     Dose/Rate Route Frequency Ordered Stop   07/31/11 1358   vancomycin (VANCOCIN) IVPB 1000 mg/200 mL premix  Status:  Discontinued        1,000 mg 200 mL/hr over 60 Minutes Intravenous 60 min pre-op 07/31/11 1358 08/01/11 1555          Medications: Scheduled Meds:   . benzonatate  100 mg Oral TID  . calcium acetate  1,334 mg Oral TID WC  . cycloSPORINE modified  100 mg Oral BID  . darbepoetin (ARANESP) injection - DIALYSIS  150 mcg Intravenous Q Tue-HD  . insulin aspart  0-15 Units Subcutaneous TID WC  . insulin aspart  8 Units Subcutaneous TID WC  . insulin glargine  15 Units Subcutaneous BID  . isosorbide mononitrate  60 mg Oral Daily  . labetalol  100 mg Oral BID  . levothyroxine  75 mcg Oral QAC breakfast  . metoCLOPramide  5 mg Oral TID PC & HS  . multivitamin  1 tablet Oral Daily  . pantoprazole  40 mg Oral Q1200  . paricalcitol      . paricalcitol  2 mcg Intravenous Q T,Th,Sa-HD  . predniSONE  5 mg Oral Q breakfast  . rOPINIRole  1 mg Oral QHS  . sodium chloride  3 mL Intravenous Q12H  . DISCONTD: insulin aspart  6 Units Subcutaneous TID WC   Continuous Infusions:  PRN Meds:.acetaminophen, acetaminophen, albuterol, ALPRAZolam, alum & mag hydroxide-simeth, guaiFENesin-dextromethorphan, heparin, heparin, heparin, HYDROcodone-acetaminophen, HYDROmorphone, menthol-cetylpyridinium, ondansetron (ZOFRAN) IV, ondansetron, promethazine   Objective: Weight change: -4 lb 10.1 oz (-2.1 kg)  Intake/Output Summary (Last 24 hours) at 08/04/11 1334 Last data filed at 08/04/11 1225  Gross per 24 hour  Intake   1065 ml  Output    150 ml  Net    915 ml   Blood pressure 136/64, pulse 89, temperature 99.3 F (37.4 C), temperature source Oral, resp. rate 24, height 5\' 8"  (1.727 m), weight 263 lb 0.1 oz (119.3 kg), SpO2 96.00%. Temp:  [97.6 F (36.4 C)-100 F (37.8 C)] 99.3 F (37.4 C) (04/11  1225) Pulse Rate:  [84-89] 89  (04/11 1230) Resp:  [16-28] 24  (04/11 1230) BP: (99-141)/(49-67) 136/64 mmHg (04/11 1230) SpO2:  [91 %-100 %] 96 % (04/11 1030) Weight:  [261 lb 3.9 oz (118.5 kg)-263 lb 0.1 oz (119.3 kg)] 263 lb 0.1 oz (119.3 kg) (04/11 1003)  Physical Exam: General: Alert and awake, oriented x3, more comfortable today HEENT: anicteric sclera, pupils reactive to light and accommodation, EOMI, oropharynx clear and without exudate  CVS tachycardic rate, normal r, no murmur rubs or gallops  Chest:realtively lear to auscultation bilaterally, no wheezing, rales or rhonchi  Abdomen: soft protuberant nontender, nondistended, normal bowel sounds,  Extremities: amputation site clean  Skin: HD site clean, AVG site clean  Neuro: nonfocal, strength and sensation intact   Lab Results:  Basename 08/04/11 1020 08/03/11 0510  WBC 11.9* 10.7*  HGB 7.0* 7.3*  HCT 23.3* 24.8*  PLT 349 351    BMET  Basename 08/04/11 1020 08/03/11 0510  NA 131* 132*  K 3.5 3.9  CL 94* 94*  CO2 23 25  GLUCOSE 51* 333*  BUN 84* 66*  CREATININE 5.40* 3.90*  CALCIUM 8.7 8.7    Micro Results: Recent Results (from the past 240 hour(s))  CULTURE, BLOOD (ROUTINE X 2)     Status: Normal (Preliminary result)  Collection Time   08/02/11  3:52 PM      Component Value Range Status Comment   Specimen Description BLOOD HEMODIALYSIS CATHETER   Final    Special Requests     Final    Value: BOTTLES DRAWN AEROBIC AND ANAEROBIC 10CC RIGHT IJ CATH   Culture  Setup Time [id number]   Final    Culture     Final    Value:        BLOOD CULTURE RECEIVED NO GROWTH TO DATE CULTURE WILL BE HELD FOR 5 DAYS BEFORE ISSUING A FINAL NEGATIVE REPORT   Report Status PENDING   Incomplete   CULTURE, BLOOD (ROUTINE X 2)     Status: Normal (Preliminary result)   Collection Time   08/02/11  5:40 PM      Component Value Range Status Comment   Specimen Description BLOOD HEMODIALYSIS CATHETER   Final    Special Requests  BOTTLES DRAWN AEROBIC AND ANAEROBIC 10CC RIGHT IJ   Final    Culture  Setup Time [id number]   Final    Culture     Final    Value:        BLOOD CULTURE RECEIVED NO GROWTH TO DATE CULTURE WILL BE HELD FOR 5 DAYS BEFORE ISSUING A FINAL NEGATIVE REPORT   Report Status PENDING   Incomplete     Studies/Results: Dg Chest Port 1 View  08/03/2011  *RADIOLOGY REPORT*  Clinical Data: Cough and shortness of breath.  PORTABLE CHEST - 1 VIEW  Comparison: 08/12/2011.  Findings: The diatek catheter is stable.  The heart is enlarged but stable.  The lungs are better aerated with resolving edema.  No effusions.  IMPRESSION: Slight improved lung aeration.  Vascular congestion without pulmonary edema.  Original Report Authenticated By: P. Kalman Jewels, M.D.      Assessment/Plan: Dale [name] is a 46 y.o. male  renal transplant with several week history of low grade temperatures and temperature spike to 101 after HD catheter placement.   1)FUO: Low grade though steroids could be blunting this. Agree with blood cultures, urine cultures reasonable though no clearcut symptoms. ESR is very high --I will check HIV ab, viral hep panel,LDH  --I will also order CT abdomen and pelvis with oral and no  IV contrast  Per  nephrology)      LOS: 13 days   Dale [name] 08/04/2011, 1:34 PM

## 2011-08-04 NOTE — Progress Notes (Signed)
Paged to unit to talk with patient.Patient is under going dyslalias for renal failure after having double kidney transplant about fifteen years ago Patient is upset and depressed about what the future may hold. Wife was in the room with patient and is very fearful of the future. Patient has been here for about two weeks and is tired of being here. Wife stated that she was both physically and emotionally drained. Talked with both of them and offered support.

## 2011-08-04 NOTE — Progress Notes (Addendum)
[facility]  On HD viaR IJ cath BP--112/58 Goal--4.3 L Hgb--7--he's getting PRBC in dialysis today   K+--3.5--on 4 K bath  For CT abd/pelvis later today (without IV contrast as he still has some residual renal function)

## 2011-08-04 NOTE — Progress Notes (Signed)
PT Cancellation Note   Treatment cancelled today due to patient receiving procedure or test  Incl. Dialysis and CT.  Will try back tomorrow.  08/04/2011  Donnella Sham, [place] [phone] (pager)

## 2011-08-05 ENCOUNTER — Inpatient Hospital Stay (HOSPITAL_COMMUNITY): Payer: Medicare Other

## 2011-08-05 LAB — TYPE AND SCREEN
ABO/RH(D): A POS
Antibody Screen: NEGATIVE
Unit division: 0
Unit division: 0

## 2011-08-05 LAB — GLUCOSE, CAPILLARY
Glucose-Capillary: 146 mg/dL — ABNORMAL HIGH (ref 70–99)
Glucose-Capillary: 93 mg/dL (ref 70–99)
Glucose-Capillary: 141 mg/dL — ABNORMAL HIGH (ref 70–99)
Glucose-Capillary: 70 mg/dL (ref 70–99)

## 2011-08-05 LAB — HIV ANTIBODY (ROUTINE TESTING W REFLEX): HIV: NONREACTIVE

## 2011-08-05 LAB — PROTIME-INR
INR: 1.63 — ABNORMAL HIGH (ref 0.00–1.49)
Prothrombin Time: 19.6 seconds — ABNORMAL HIGH (ref 11.6–15.2)

## 2011-08-05 MED ORDER — HEPARIN SODIUM (PORCINE) 1000 UNIT/ML DIALYSIS
20.0000 [IU]/kg | INTRAMUSCULAR | Status: DC | PRN
  Administered 2011-08-06: 2300 [IU] via INTRAVENOUS_CENTRAL
  Filled 2011-08-05: qty 3

## 2011-08-05 MED ORDER — MIDAZOLAM HCL 2 MG/2ML IJ SOLN
INTRAMUSCULAR | Status: AC
  Filled 2011-08-05: qty 2

## 2011-08-05 MED ORDER — FENTANYL CITRATE 0.05 MG/ML IJ SOLN
INTRAMUSCULAR | Status: AC
  Filled 2011-08-05: qty 4

## 2011-08-05 NOTE — H&P (Signed)
Dale [name] is an 46 y.o. male.   Chief Complaint: ESRD; anemia; low grade temp CT shows abdominal LAN; Lt groin LAN Scheduled now for biopsy of left groin lymph node in [place] HPI: DM; CKD; anemia; hx renal transplant 1995  Past Medical History  Diagnosis Date  . Renal disease   . Diabetes mellitus     Past Surgical History  Procedure Date  . Appendectomy   . Hernia repair   . Kidney transplant   . Cataract extraction   . Arteriovenous graft placement   . Av fistula placement   . Toe amputation   . Below knee leg amputation   . Carpal tunnel release   . Shoulder surgery   . Av fistula placement 07/29/2011    Procedure: ARTERIOVENOUS (AV) FISTULA CREATION;  Surgeon: Mal Misty, MD;  Location: [place];  Service: Vascular;  Laterality: Right;  Brachial cephalic  . Insertion of dialysis catheter 08/01/2011    Procedure: INSERTION OF DIALYSIS CATHETER;  Surgeon: Rosetta Posner, MD;  Location: [facility] OR;  Service: Vascular;  Laterality: Right;  insertion of dialysis catheter on right internal jugular vein    No family history on file. Social History:  reports that he has never smoked. He does not have any smokeless tobacco history on file. He reports that he drinks alcohol. He reports that he does not use illicit drugs.  Allergies:  Allergies  Allergen Reactions  . Amoxicillin   . Ciprofloxacin   . Clindamycin/Lincomycin   . Codeine   . Levaquin   . Vasotec Cough    Medications Prior to Admission  Medication Dose Route Frequency Provider Last Rate Last Dose  . acetaminophen (TYLENOL) 325 MG tablet           . acetaminophen (TYLENOL) tablet 650 mg  650 mg Oral Q6H PRN [name], MD   650 mg at 08/05/11 0644   Or  . acetaminophen (TYLENOL) suppository 650 mg  650 mg Rectal Q6H PRN [name], MD      . albuterol (PROVENTIL HFA;VENTOLIN HFA) 108 (90 BASE) MCG/ACT inhaler 1 puff  1 puff Inhalation Q6H PRN [name], MD   1 puff at 08/04/11 2104  . ALPRAZolam Duanne Moron) tablet  0.5 mg  0.5 mg Oral QHS PRN Toy Baker, MD   0.5 mg at 08/02/11 0116  . alum & mag hydroxide-simeth (MAALOX/MYLANTA) 200-200-20 MG/5ML suspension 30 mL  30 mL Oral Q6H PRN [name], MD   30 mL at 07/23/11 1856  . benzonatate (TESSALON) capsule 100 mg  100 mg Oral TID [name], MD   100 mg at 08/05/11 1019  . calcium acetate (PHOSLO) capsule 1,334 mg  1,334 mg Oral TID WC [name], MD   1,334 mg at 08/05/11 [id number]  . cycloSPORINE modified (NEORAL/GENGRAF) capsule 100 mg  100 mg Oral BID [name], MD   100 mg at 08/05/11 1019  . darbepoetin (ARANESP) injection 150 mcg  150 mcg Intravenous Q Tue-HD [name], MD      . fentaNYL (SUBLIMAZE) 0.05 MG/ML injection           . furosemide (LASIX) injection 80 mg  80 mg Intravenous Once Estanislado Emms, MD   80 mg at 07/28/11 1556  . guaiFENesin-dextromethorphan (ROBITUSSIN DM) 100-10 MG/5ML syrup 5 mL  5 mL Oral Q4H PRN [name], MD   5 mL at 08/01/11 0241  . heparin injection 2,400 Units  20  Units/kg Dialysis PRN Estanislado Emms, MD      . heparin injection 2,400 Units  20 Units/kg Dialysis PRN [name], MD   2,400 Units at 08/02/11 1659  . heparin injection 2,400 Units  20 Units/kg Dialysis PRN [name], MD      . HYDROcodone-acetaminophen [facility]) 5-325 MG per tablet 1 tablet  1 tablet Oral Q6H PRN [name], MD   1 tablet at 08/04/11 1847  . HYDROmorphone (DILAUDID) injection 1 mg  1 mg Intravenous Q4H PRN [name], MD   1 mg at 08/05/11 0951  . insulin aspart (novoLOG) injection 0-15 Units  0-15 Units Subcutaneous TID WC [name], MD   2 Units at 08/05/11 [id number]  . insulin aspart (novoLOG) injection 6 Units  6 Units Subcutaneous Once [name], NP   6 Units at 07/24/11 0345  . insulin aspart (novoLOG) injection 8 Units  8 Units Subcutaneous TID WC [name], MD   8 Units at 08/05/11 [id number]  . insulin glargine (LANTUS) injection 15 Units  15 Units Subcutaneous BID [name],  MD   15 Units at 08/05/11 1028  . isosorbide mononitrate (IMDUR) 24 hr tablet 60 mg  60 mg Oral Daily [name], MD   60 mg at 08/05/11 1019  . labetalol (NORMODYNE) tablet 100 mg  100 mg Oral BID [name], MD   100 mg at 08/05/11 1019  . levothyroxine (SYNTHROID, LEVOTHROID) tablet 75 mcg  75 mcg Oral QAC breakfast [name], MD   75 mcg at 08/05/11 [id number]  . LORazepam (ATIVAN) injection 1 mg  1 mg Intravenous Once [name], MD   1 mg at 08/04/11 1620  . menthol-cetylpyridinium (CEPACOL) lozenge 3 mg  1 lozenge Oral PRN [name], MD      . metoCLOPramide (REGLAN) tablet 5 mg  5 mg Oral TID PC & HS [name], MD   5 mg at 08/05/11 [id number]  . multivitamin (RENA-VIT) tablet 1 tablet  1 tablet Oral Daily Estanislado Emms, MD   1 tablet at 08/05/11 1019  . ondansetron (ZOFRAN) tablet 4 mg  4 mg Oral Q6H PRN [name], MD   4 mg at 07/31/11 0439   Or  . ondansetron (ZOFRAN) injection 4 mg  4 mg Intravenous Q6H PRN [name], MD   4 mg at 08/05/11 0047  . pantoprazole (PROTONIX) EC tablet 40 mg  40 mg Oral Q1200 [name], MD   40 mg at 08/04/11 1855  . paricalcitol (ZEMPLAR) 5 MCG/ML injection           . paricalcitol (ZEMPLAR) 5 MCG/ML injection           . paricalcitol (ZEMPLAR) injection 2 mcg  2 mcg Intravenous Q T,Th,Sa-HD [name], MD   2 mcg at 08/04/11 1059  . predniSONE (DELTASONE) tablet 5 mg  5 mg Oral Q breakfast [name], MD   5 mg at 08/05/11 [id number]  . promethazine (PHENERGAN) tablet 25 mg  25 mg Oral Q6H PRN [name], MD   25 mg at 08/03/11 1310  . rOPINIRole (REQUIP) tablet 1 mg  1 mg Oral QHS [name], MD   1 mg at 08/04/11 2058  . sodium chloride 0.9 % injection 3 mL  3 mL Intravenous Q12H [name], MD   3 mL at 08/05/11 1022  . sodium polystyrene (KAYEXALATE) 15  GM/60ML suspension 15 g  15 g Oral Once Samuella Cota, MD   15 g at 07/30/11 1438  . sodium polystyrene (KAYEXALATE) 15 GM/60ML suspension 30 g  30 g  Oral Once Estanislado Emms, MD   30 g at 07/31/11 0849  . sodium polystyrene (KAYEXALATE) 15 GM/60ML suspension 30 g  30 g Oral Once Samuella Cota, MD   30 g at 07/31/11 1543  . DISCONTD: 0.9 %  sodium chloride infusion   Intravenous Q14 Days Windy Kalata, MD 10 mL/hr at 07/22/11 1414    . DISCONTD: 0.9 %  sodium chloride infusion   Intravenous Continuous Estanislado Emms, MD 10 mL/hr at 07/29/11 1002    . DISCONTD: 0.9 % irrigation (POUR BTL)    PRN Mal Misty, MD   1,000 mL at 07/29/11 1119  . DISCONTD: 0.9 % irrigation (POUR BTL)    PRN Rosetta Posner, MD   1,000 mL at 08/01/11 1259  . DISCONTD: calcium acetate (PHOSLO) capsule 667 mg  667 mg Oral TID WC Estanislado Emms, MD   667 mg at 08/02/11 1217  . DISCONTD: darbepoetin (ARANESP) 150 MCG/0.3ML injection           . DISCONTD: diphenhydrAMINE (BENADRYL) injection 25 mg  25 mg Intravenous Q6H PRN [name], MD      . DISCONTD: droperidol (INAPSINE) injection 0.625 mg  0.625 mg Intravenous PRN Delma Freeze, MD      . DISCONTD: epoetin alfa (EPOGEN,PROCRIT) 16109 UNIT/ML injection           . DISCONTD: epoetin alfa (EPOGEN,PROCRIT) injection 40,000 Units  40,000 Units Subcutaneous Q14 Days Windy Kalata, MD   40,000 Units at 07/22/11 1414  . DISCONTD: ferumoxytol [facility]) injection 510 mg  510 mg Intravenous Q14 Days Windy Kalata, MD   510 mg at 07/22/11 1413  . DISCONTD: furosemide (LASIX) injection 10 mg  10 mg Intravenous Q4H [name], MD      . DISCONTD: heparin 6,000 Units in sodium chloride irrigation 0.9 % 500 mL irrigation    PRN Mal Misty, MD      . DISCONTD: heparin 6,000 Units in sodium chloride irrigation 0.9 % 500 mL irrigation    PRN Rosetta Posner, MD      . DISCONTD: heparin injection    PRN Rosetta Posner, MD   5 mL at 08/01/11 1307  . DISCONTD: HYDROmorphone (DILAUDID) injection 0.25-0.5 mg  0.25-0.5 mg Intravenous Q5 min PRN Delma Freeze, MD      . DISCONTD: HYDROmorphone  (DILAUDID) injection 0.25-0.5 mg  0.25-0.5 mg Intravenous Q5 min PRN Warrick Parisian, MD      . DISCONTD: insulin aspart (novoLOG) injection 0-20 Units  0-20 Units Subcutaneous TID WC [name], MD   7 Units at 07/26/11 1153  . DISCONTD: insulin aspart (novoLOG) injection 0-5 Units  0-5 Units Subcutaneous QHS [name], MD   3 Units at 08/01/11 1422  . DISCONTD: insulin aspart (novoLOG) injection 0-9 Units  0-9 Units Subcutaneous TID WC [name], MD   9 Units at 07/24/11 0903  . DISCONTD: insulin aspart (novoLOG) injection 0-9 Units  0-9 Units Subcutaneous TID WC [name], MD   7 Units at 08/03/11 0854  . DISCONTD: insulin aspart (novoLOG) injection 3 Units  3 Units Subcutaneous TID WC [name], MD   3 Units at 07/26/11 1153  . DISCONTD: insulin aspart (novoLOG)  injection 6 Units  6 Units Subcutaneous TID WC [name], MD   6 Units at 08/03/11 1304  . DISCONTD: insulin glargine (LANTUS) injection 20 Units  20 Units Subcutaneous QHS [name], NP   20 Units at 07/25/11 2156  . DISCONTD: insulin glargine (LANTUS) injection 25 Units  25 Units Subcutaneous QHS [name], MD   25 Units at 07/30/11 2352  . DISCONTD: insulin glargine (LANTUS) injection 30 Units  30 Units Subcutaneous QHS Samuella Cota, MD   30 Units at 08/02/11 2244  . DISCONTD: labetalol (NORMODYNE) tablet 300 mg  300 mg Oral BID [name], MD   300 mg at 08/03/11 1110  . DISCONTD: lidocaine-EPINEPHrine (XYLOCAINE W/EPI) 0.5-1:200000 % (with pres) injection    PRN Mal Misty, MD   50 mL at 07/29/11 1120  . DISCONTD: lidocaine-EPINEPHrine (XYLOCAINE W/EPI) 0.5-1:200000 % (with pres) injection    PRN Mal Misty, MD   50 mL at 07/29/11 1134  . DISCONTD: lidocaine-EPINEPHrine (XYLOCAINE W/EPI) 0.5-1:200000 % (with pres) injection    PRN Rosetta Posner, MD   9 mL at 08/01/11 1259  . DISCONTD: LORazepam (ATIVAN) injection 0.5 mg  0.5 mg Intravenous Once [name], MD      .  DISCONTD: ondansetron  [facility]) injection 4 mg  4 mg Intravenous Once PRN Warrick Parisian, MD      . DISCONTD: vancomycin (VANCOCIN) IVPB 1000 mg/200 mL premix  1,000 mg Intravenous 60 min Pre-Op Conrad Leary, MD       Medications Prior to Admission  Medication Sig Dispense Refill  . allopurinol (ZYLOPRIM) 300 MG tablet Take 300 mg by mouth Daily.      . ANDROGEL PUMP 20.25 MG/ACT (1.62%) GEL Apply 4 application topically Daily.      . cycloSPORINE modified (NEORAL/GENGRAF) 100 MG capsule Take 100 mg by mouth 2 (two) times daily.       . furosemide (LASIX) 80 MG tablet Take 80 mg by mouth Twice daily.      . Hydrocodone-Acetaminophen 5-300 MG TABS Take 1 tablet by mouth Every 6 hours as needed. For pain      . isosorbide mononitrate (IMDUR) 60 MG 24 hr tablet Take 60 mg by mouth Daily.      [name] Kitchen labetalol (NORMODYNE) 300 MG tablet Take 300 mg by mouth Twice daily.      [name] Kitchen levothyroxine (SYNTHROID, LEVOTHROID) 75 MCG tablet Take 75 mg by mouth Daily.      . metoCLOPramide (REGLAN) 5 MG tablet Take 5 mg by mouth 4 (four) times daily - after meals and at bedtime.      . predniSONE (DELTASONE) 5 MG tablet Take 5 mg by mouth Daily.      . promethazine (PHENERGAN) 25 MG tablet Take 25 mg by mouth Every 6 hours as needed. For nausea      . PROVENTIL HFA 108 (90 BASE) MCG/ACT inhaler Inhale 1 puff into the lungs Every 6 hours as needed. For shortness of breath      . rOPINIRole (REQUIP) 1 MG tablet Take 1 mg by mouth Every evening. Take at 2000      . NOVOLOG 100 UNIT/ML injection Inject into the skin Continuous.      [name] Kitchen oxyCODONE (ROXICODONE) 5 MG immediate release tablet Take 1 tablet (5 mg total) by mouth every 6 (six) hours as needed for pain.  30 tablet  0    Results for orders placed during the hospital encounter of  07/22/11 (from the past 48 hour(s))  GLUCOSE, CAPILLARY     Status: Abnormal   Collection Time   08/03/11 12:24 PM      Component Value Range Comment   Glucose-Capillary 226 (*) 70  - 99 (mg/dL)   GLUCOSE, CAPILLARY     Status: Abnormal   Collection Time   08/03/11  4:33 PM      Component Value Range Comment   Glucose-Capillary 157 (*) 70 - 99 (mg/dL)   HEPATITIS PANEL, ACUTE     Status: Normal (Preliminary result)   Collection Time   08/03/11  8:24 PM      Component Value Range Comment   Hepatitis B Surface Ag NEGATIVE  NEGATIVE     HCV Ab NEGATIVE  NEGATIVE     Hep A IgM PENDING  NEGATIVE     Hep B C IgM PENDING  NEGATIVE    GLUCOSE, CAPILLARY     Status: Abnormal   Collection Time   08/03/11  9:37 PM      Component Value Range Comment   Glucose-Capillary 120 (*) 70 - 99 (mg/dL)    Comment 1 Notify RN     GLUCOSE, CAPILLARY     Status: Abnormal   Collection Time   08/04/11  1:03 AM      Component Value Range Comment   Glucose-Capillary 120 (*) 70 - 99 (mg/dL)   GLUCOSE, CAPILLARY     Status: Abnormal   Collection Time   08/04/11  2:20 AM      Component Value Range Comment   Glucose-Capillary 120 (*) 70 - 99 (mg/dL)    Comment 1 Documented in Chart      Comment 2 Notify RN     GLUCOSE, CAPILLARY     Status: Abnormal   Collection Time   08/04/11  3:58 AM      Component Value Range Comment   Glucose-Capillary 115 (*) 70 - 99 (mg/dL)   GLUCOSE, CAPILLARY     Status: Abnormal   Collection Time   08/04/11  5:52 AM      Component Value Range Comment   Glucose-Capillary 107 (*) 70 - 99 (mg/dL)   GLUCOSE, CAPILLARY     Status: Normal   Collection Time   08/04/11  7:50 AM      Component Value Range Comment   Glucose-Capillary 98  70 - 99 (mg/dL)   RENAL FUNCTION PANEL     Status: Abnormal   Collection Time   08/04/11 10:20 AM      Component Value Range Comment   Sodium 131 (*) 135 - 145 (mEq/L)    Potassium 3.5  3.5 - 5.1 (mEq/L)    Chloride 94 (*) 96 - 112 (mEq/L)    CO2 23  19 - 32 (mEq/L)    Glucose, Bld 51 (*) 70 - 99 (mg/dL)    BUN 84 (*) 6 - 23 (mg/dL)    Creatinine, Ser 5.40 (*) 0.50 - 1.35 (mg/dL)    Calcium 8.7  8.4 - 10.5 (mg/dL)    Phosphorus  8.0 (*) 2.3 - 4.6 (mg/dL)    Albumin 1.2 (*) 3.5 - 5.2 (g/dL)    GFR calc non Af Amer 12 (*) >90 (mL/min)    GFR calc Af Amer 13 (*) >90 (mL/min)   CBC     Status: Abnormal   Collection Time   08/04/11 10:20 AM      Component Value Range Comment   WBC 11.9 (*) 4.0 - 10.5 (K/uL)  RBC 2.66 (*) 4.22 - 5.81 (MIL/uL)    Hemoglobin 7.0 (*) 13.0 - 17.0 (g/dL)    HCT 23.3 (*) 39.0 - 52.0 (%)    MCV 87.6  78.0 - 100.0 (fL)    MCH 26.3  26.0 - 34.0 (pg)    MCHC 30.0  30.0 - 36.0 (g/dL)    RDW 19.1 (*) 11.5 - 15.5 (%)    Platelets 349  150 - 400 (K/uL)   SEDIMENTATION RATE     Status: Abnormal   Collection Time   08/04/11 10:20 AM      Component Value Range Comment   Sed Rate >140 (*) 0 - 16 (mm/hr)   HIV ANTIBODY (ROUTINE TESTING)     Status: Normal   Collection Time   08/04/11 11:30 AM      Component Value Range Comment   HIV NON REACTIVE  NON REACTIVE    C-REACTIVE PROTEIN     Status: Abnormal   Collection Time   08/04/11 11:30 AM      Component Value Range Comment   CRP 31.12 (*) <0.60 (mg/dL)   GLUCOSE, CAPILLARY     Status: Abnormal   Collection Time   08/04/11  2:51 PM      Component Value Range Comment   Glucose-Capillary 117 (*) 70 - 99 (mg/dL)   URINALYSIS, ROUTINE W REFLEX MICROSCOPIC     Status: Abnormal   Collection Time   08/04/11  4:11 PM      Component Value Range Comment   Color, Urine RED (*) YELLOW  BIOCHEMICALS MAY BE AFFECTED BY COLOR   APPearance TURBID (*) CLEAR     Specific Gravity, Urine 1.024  1.005 - 1.030     pH 5.0  5.0 - 8.0     Glucose, UA 100 (*) NEGATIVE (mg/dL)    Hgb urine dipstick NEGATIVE  NEGATIVE     Bilirubin Urine MODERATE (*) NEGATIVE     Ketones, ur 15 (*) NEGATIVE (mg/dL)    Protein, ur 100 (*) NEGATIVE (mg/dL)    Urobilinogen, UA 1.0  0.0 - 1.0 (mg/dL)    Nitrite POSITIVE (*) NEGATIVE     Leukocytes, UA LARGE (*) NEGATIVE    URINE MICROSCOPIC-ADD ON     Status: Abnormal   Collection Time   08/04/11  4:11 PM      Component Value  Range Comment   Squamous Epithelial / LPF MANY (*) RARE     WBC, UA 11-20  <3 (WBC/hpf)    Bacteria, UA MANY (*) RARE     Casts HYALINE CASTS (*) NEGATIVE  GRANULAR CAST   Urine-Other AMORPHOUS URATES/PHOSPHATES   MANY YEAST  GLUCOSE, CAPILLARY     Status: Abnormal   Collection Time   08/04/11  5:44 PM      Component Value Range Comment   Glucose-Capillary 186 (*) 70 - 99 (mg/dL)   GLUCOSE, CAPILLARY     Status: Abnormal   Collection Time   08/04/11  9:12 PM      Component Value Range Comment   Glucose-Capillary 131 (*) 70 - 99 (mg/dL)   GLUCOSE, CAPILLARY     Status: Abnormal   Collection Time   08/05/11  7:32 AM      Component Value Range Comment   Glucose-Capillary 146 (*) 70 - 99 (mg/dL)    Ct Abdomen Pelvis Wo Contrast  08/04/2011  *RADIOLOGY REPORT*  Clinical Data: Recurrent renal failure requiring hemodialysis. History of renal transplant.  CT ABDOMEN AND PELVIS WITHOUT CONTRAST  Technique:  Multidetector CT imaging of the abdomen and pelvis was performed following the standard protocol without intravenous contrast.  Comparison: Right upper quadrant abdominal ultrasound 07/14/2011. Abdominal CT 08/26/2004.  Findings: Images through the lung bases demonstrate cardiomegaly and hemodialysis catheters within the right atrium.  There is vascular congestion and mild bibasilar atelectasis.  No focal abnormalities are seen within the liver, spleen, gallbladder, pancreas or adrenal glands.  The spleen is slightly larger than on the prior CT, but without focal abnormality.  There is severe chronic cortical thinning of the native kidneys bilaterally.  Appearance is unchanged.  The transplanted kidney in the left iliac fossa appears stable without hydronephrosis, significant cortical thinning or surrounding inflammatory change.  The patient has developed multiple enlarged retroperitoneal and mesenteric lymph nodes.  Examples include an 11 mm right retrocrural node on image 16, a mesenteric node  measuring 3.6 x 2.3 cm on image 31, a left periaortic node measuring 2.3 x 3.1 cm on image 46 and a 2.0 x 3.8 cm left external iliac node on image 79. In addition, there are several mildly enlarged lymph nodes within the left groin.  There are postsurgical changes in the right groin without significant adenopathy.  No bowel lesions are identified.  There is no evidence of bowel obstruction.  The bladder contents demonstrate mildly increased density.  There is no evidence of diskitis or other acute osseous abnormality.  IMPRESSION:  1.  Interval development of multi focal lymphadenopathy suspicious for lymphoma.  Enlarged lymph nodes in the left groin are most accessible for excisional biopsy. 2.  Slight interval increase in splenic size without significant splenomegaly or focal abnormality. 3.  Transplanted kidney in the left iliac fossa has a stable appearance without hydronephrosis or perinephric soft tissue stranding.  Chronic atrophy of the native kidneys appears unchanged. 4.  High density urine within the urinary bladder.  Original Report Authenticated By: Vivia Ewing, M.D.   Dg Chest Port 1 View  08/03/2011  *RADIOLOGY REPORT*  Clinical Data: Cough and shortness of breath.  PORTABLE CHEST - 1 VIEW  Comparison: 08/12/2011.  Findings: The diatek catheter is stable.  The heart is enlarged but stable.  The lungs are better aerated with resolving edema.  No effusions.  IMPRESSION: Slight improved lung aeration.  Vascular congestion without pulmonary edema.  Original Report Authenticated By: P. Kalman Jewels, M.D.    Review of Systems  Constitutional: Positive for fever and malaise/fatigue.       Low grade  Respiratory: Negative for cough.   Cardiovascular: Negative for chest pain.  Gastrointestinal: Positive for abdominal pain. Negative for nausea and vomiting.    Blood pressure 123/69, pulse 84, temperature 97.8 F (36.6 C), temperature source Oral, resp. rate 18, height 5\' 8"  (1.727 m),  weight 255 lb 11.7 oz (116 kg), SpO2 94.00%. Physical Exam  Cardiovascular: Normal rate and normal heart sounds.   No murmur heard. Respiratory: Effort normal and breath sounds normal. He has no wheezes.  GI: Bowel sounds are normal. There is no tenderness.  Musculoskeletal:       L BKA  Neurological: He is alert.  Psychiatric: Judgment normal.       groggy     Assessment/Plan Anemia; low grade temp; CT shows abdominal LAN and Lt groin LAN Scheduled now for Left groin lymph node biopsy Pt aware of procedure benefits and risks and agreeable to proceed. Consent signed.  Sunjai Levandoski A 08/05/2011, 10:54 AM

## 2011-08-05 NOTE — Progress Notes (Signed)
Subjective: CT suggestive of lymphoma possibly   Antibiotics:  Anti-infectives     Start     Dose/Rate Route Frequency Ordered Stop   07/31/11 1358   vancomycin (VANCOCIN) IVPB 1000 mg/200 mL premix  Status:  Discontinued        1,000 mg 200 mL/hr over 60 Minutes Intravenous 60 min pre-op 07/31/11 1358 08/01/11 1555          Medications: Scheduled Meds:    . benzonatate  100 mg Oral TID  . calcium acetate  1,334 mg Oral TID WC  . cycloSPORINE modified  100 mg Oral BID  . darbepoetin (ARANESP) injection - DIALYSIS  150 mcg Intravenous Q Tue-HD  . insulin aspart  0-15 Units Subcutaneous TID WC  . insulin aspart  8 Units Subcutaneous TID WC  . insulin glargine  15 Units Subcutaneous BID  . isosorbide mononitrate  60 mg Oral Daily  . labetalol  100 mg Oral BID  . levothyroxine  75 mcg Oral QAC breakfast  . LORazepam  1 mg Intravenous Once  . metoCLOPramide  5 mg Oral TID PC & HS  . multivitamin  1 tablet Oral Daily  . pantoprazole  40 mg Oral Q1200  . paricalcitol      . paricalcitol  2 mcg Intravenous Q T,Th,Sa-HD  . predniSONE  5 mg Oral Q breakfast  . rOPINIRole  1 mg Oral QHS  . sodium chloride  3 mL Intravenous Q12H   Continuous Infusions:  PRN Meds:.acetaminophen, acetaminophen, albuterol, ALPRAZolam, alum & mag hydroxide-simeth, guaiFENesin-dextromethorphan, heparin, heparin, heparin, HYDROcodone-acetaminophen, HYDROmorphone, menthol-cetylpyridinium, ondansetron (ZOFRAN) IV, ondansetron, promethazine   Objective: Weight change: 1 lb 12.2 oz (0.8 kg)  Intake/Output Summary (Last 24 hours) at 08/05/11 1109 Last data filed at 08/05/11 [id number]  Gross per 24 hour  Intake   1185 ml  Output   3750 ml  Net  -2565 ml   Blood pressure 123/69, pulse 84, temperature 97.8 F (36.6 C), temperature source Oral, resp. rate 18, height 5\' 8"  (1.727 m), weight 255 lb 11.7 oz (116 kg), SpO2 94.00%. Temp:  [97.6 F (36.4 C)-101 F (38.3 C)] 97.8 F (36.6 C) (04/12 0700) Pulse  Rate:  [83-92] 84  (04/12 1023) Resp:  [16-26] 18  (04/12 1023) BP: (100-156)/(49-74) 123/69 mmHg (04/12 1023) SpO2:  [93 %-100 %] 94 % (04/12 1007) Weight:  [255 lb 11.7 oz (116 kg)] 255 lb 11.7 oz (116 kg) (04/11 1405)  Physical Exam: General: Alert and awake, oriented x3, more comfortable today HEENT: anicteric sclera, pupils reactive to light and accommodation, EOMI, oropharynx clear and without exudate  CVS tachycardic rate, normal r, no murmur rubs or gallops  Chest:realtively lear to auscultation bilaterally, no wheezing, rales or rhonchi  Abdomen: soft protuberant nontender, nondistended, normal bowel sounds,  Extremities: amputation site clean  Skin: HD site clean, AVG site clean  Neuro: nonfocal, strength and sensation intact   Lab Results:  Basename 08/04/11 1020 08/03/11 0510  WBC 11.9* 10.7*  HGB 7.0* 7.3*  HCT 23.3* 24.8*  PLT 349 351    BMET  Basename 08/04/11 1020 08/03/11 0510  NA 131* 132*  K 3.5 3.9  CL 94* 94*  CO2 23 25  GLUCOSE 51* 333*  BUN 84* 66*  CREATININE 5.40* 3.90*  CALCIUM 8.7 8.7    Micro Results: Recent Results (from the past 240 hour(s))  CULTURE, BLOOD (ROUTINE X 2)     Status: Normal (Preliminary result)   Collection Time   08/02/11  3:52 PM  Component Value Range Status Comment   Specimen Description BLOOD HEMODIALYSIS CATHETER   Final    Special Requests     Final    Value: BOTTLES DRAWN AEROBIC AND ANAEROBIC 10CC RIGHT IJ CATH   Culture  Setup Time [id number]   Final    Culture     Final    Value:        BLOOD CULTURE RECEIVED NO GROWTH TO DATE CULTURE WILL BE HELD FOR 5 DAYS BEFORE ISSUING A FINAL NEGATIVE REPORT   Report Status PENDING   Incomplete   CULTURE, BLOOD (ROUTINE X 2)     Status: Normal (Preliminary result)   Collection Time   08/02/11  5:40 PM      Component Value Range Status Comment   Specimen Description BLOOD HEMODIALYSIS CATHETER   Final    Special Requests BOTTLES DRAWN AEROBIC AND ANAEROBIC 10CC  RIGHT IJ   Final    Culture  Setup Time [id number]   Final    Culture     Final    Value:        BLOOD CULTURE RECEIVED NO GROWTH TO DATE CULTURE WILL BE HELD FOR 5 DAYS BEFORE ISSUING A FINAL NEGATIVE REPORT   Report Status PENDING   Incomplete     Studies/Results: Ct Abdomen Pelvis Wo Contrast  08/04/2011  *RADIOLOGY REPORT*  Clinical Data: Recurrent renal failure requiring hemodialysis. History of renal transplant.  CT ABDOMEN AND PELVIS WITHOUT CONTRAST  Technique:  Multidetector CT imaging of the abdomen and pelvis was performed following the standard protocol without intravenous contrast.  Comparison: Right upper quadrant abdominal ultrasound 07/14/2011. Abdominal CT 08/26/2004.  Findings: Images through the lung bases demonstrate cardiomegaly and hemodialysis catheters within the right atrium.  There is vascular congestion and mild bibasilar atelectasis.  No focal abnormalities are seen within the liver, spleen, gallbladder, pancreas or adrenal glands.  The spleen is slightly larger than on the prior CT, but without focal abnormality.  There is severe chronic cortical thinning of the native kidneys bilaterally.  Appearance is unchanged.  The transplanted kidney in the left iliac fossa appears stable without hydronephrosis, significant cortical thinning or surrounding inflammatory change.  The patient has developed multiple enlarged retroperitoneal and mesenteric lymph nodes.  Examples include an 11 mm right retrocrural node on image 16, a mesenteric node measuring 3.6 x 2.3 cm on image 31, a left periaortic node measuring 2.3 x 3.1 cm on image 46 and a 2.0 x 3.8 cm left external iliac node on image 79. In addition, there are several mildly enlarged lymph nodes within the left groin.  There are postsurgical changes in the right groin without significant adenopathy.  No bowel lesions are identified.  There is no evidence of bowel obstruction.  The bladder contents demonstrate mildly increased  density.  There is no evidence of diskitis or other acute osseous abnormality.  IMPRESSION:  1.  Interval development of multi focal lymphadenopathy suspicious for lymphoma.  Enlarged lymph nodes in the left groin are most accessible for excisional biopsy. 2.  Slight interval increase in splenic size without significant splenomegaly or focal abnormality. 3.  Transplanted kidney in the left iliac fossa has a stable appearance without hydronephrosis or perinephric soft tissue stranding.  Chronic atrophy of the native kidneys appears unchanged. 4.  High density urine within the urinary bladder.  Original Report Authenticated By: Vivia Ewing, M.D.   Dg Chest Port 1 View  08/03/2011  *RADIOLOGY REPORT*  Clinical Data: Cough and shortness  of breath.  PORTABLE CHEST - 1 VIEW  Comparison: 08/12/2011.  Findings: The diatek catheter is stable.  The heart is enlarged but stable.  The lungs are better aerated with resolving edema.  No effusions.  IMPRESSION: Slight improved lung aeration.  Vascular congestion without pulmonary edema.  Original Report Authenticated By: P. Kalman Jewels, M.D.      Assessment/Plan: Dale [name] is a 46 y.o. male  renal transplant with several week history of low grade temperatures and temperature spike to 101 after HD catheter placement. Now CT shows multiple enlarged LN in pelvis. HIV, hep panel negative. Cultures unrevealing   1)FUO: Given pelvic lymphadenopathy agree with biopsy. I am concerned that IR guided biopsy will be inconclusive. I think he will ultimately need EXCISIONAL LN biopsy by General Surgery with specimens sent for path, flow, AFB, fungal and bacterial cultures.  I think we have a clear cut target for culprit of his low grade fevers and symptoms. Please obtain above cultures with biopsy.  I will follow more peripherally but do not hesitate to call us back if further guidance on workup for fever and LN is needed.       LOS: 14 days   [name] 08/05/2011, 11:09 AM

## 2011-08-05 NOTE — Progress Notes (Signed)
Chaplain's note: 14:20- 15:20 Visited [t room per referral from nurse.  Introduced myself to pt and wife Maudie Mercury).  Offered pastoral presence and support.  Pt shared tiredness from dialysis and his that he had fatigue and despair from that yesterday pm.  Pt expecting biopsy later today.  Pt rec'd pain meds and this made hi sleepy.  I talked with pt wife about pt and her feelings regarding LOS, and the major health changes, as well as possible results of bx.  Pt and wife have some family support nearby, but do not have a faith community.  Offered prayer which wife accepted.  Offered to be available for further support when needed or requested.  08/05/11 1500  Clinical Encounter Type  Visited With Patient and family together  Visit Type Spiritual support  Referral From Nurse;Chaplain  Spiritual Encounters  Spiritual Needs Prayer;Emotional  Stress Factors  Patient Stress Factors Loss of control  Family Stress Factors Major life changes

## 2011-08-05 NOTE — Progress Notes (Addendum)
Dale [name] [id number] is a 46 y.o. male,  Outpatient Primary MD for the patient is No primary provider on file.  Chief Complaint  Patient presents with  . Weakness  . Shortness of Breath        Subjective:   Dale [name] today has, No headache, No chest pain, No abdominal pain - No Nausea, No new weakness tingling or numbness, some intermittent Cough - No SOB.   Objective:   Filed Vitals:   08/04/11 1746 08/04/11 2100 08/05/11 0554 08/05/11 0700  BP: 149/73 156/69 126/66 100/57  Pulse: 89 88 92 83  Temp: 98.9 F (37.2 C) 98.6 F (37 C) 101 F (38.3 C) 97.8 F (36.6 C)  TempSrc: Oral Oral Oral Oral  Resp: 18 17 18 22   Height:      Weight:      SpO2: 93% 96% 98% 100%    Wt Readings from Last 3 Encounters:  08/04/11 116 kg (255 lb 11.7 oz)  08/04/11 116 kg (255 lb 11.7 oz)  08/04/11 116 kg (255 lb 11.7 oz)     Intake/Output Summary (Last 24 hours) at 08/05/11 0856 Last data filed at 08/05/11 [id number]  Gross per 24 hour  Intake   1185 ml  Output   3750 ml  Net  -2565 ml    Exam Awake mild delirium No new F.N deficits, Normal affect [place].AT,PERRAL Supple Neck,No JVD, No cervical lymphadenopathy appriciated.  Symmetrical Chest wall movement, Good air movement bilaterally, CTAB RRR,No Gallops,Rubs or new Murmurs, No Parasternal Heave +ve B.Sounds, Abd Soft, Non tender, No organomegaly appriciated, No rebound -guarding or rigidity. 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   Data Review  CBC  Lab 08/04/11 1020 08/03/11 0510 08/02/11 1531 08/01/11 0455 07/31/11 0457  WBC 11.9* 10.7* 12.4* 10.1 9.8  HGB 7.0* 7.3* 7.4* 7.5* 7.6*  HCT 23.3* 24.8* 24.7* 24.5* 25.0*  PLT 349 351 429* 403* 399  MCV 87.6 90.2 88.5 88.8 88.7  MCH 26.3 26.5 26.5 27.2 27.0  MCHC 30.0 29.4* 30.0 30.6 30.4  RDW 19.1* 19.4* 19.5* 19.6* 19.5*  LYMPHSABS -- -- -- -- --  MONOABS -- -- -- -- --  EOSABS -- -- -- -- --  BASOSABS -- -- -- -- --  BANDABS -- -- -- -- --    Chemistries   Lab  08/04/11 1020 08/03/11 0510 08/02/11 1531 08/01/11 1846 08/01/11 0455  NA 131* 132* 133* 133* 129*  K 3.5 3.9 3.8 4.8 4.5  CL 94* 94* 97 96 94*  CO2 23 25 23 20 20   GLUCOSE 51* 333* 263* 173* 289*  BUN 84* 66* 107* 137* 139*  CREATININE 5.40* 3.90* 4.64* 4.59* 4.81*  CALCIUM 8.7 8.7 8.8 9.2 8.9  MG -- -- -- -- --  AST -- -- -- -- --  ALT -- -- -- -- --  ALKPHOS -- -- -- -- --  BILITOT -- -- -- -- --   ------------------------------------------------------------------------------------------------------------------ estimated creatinine clearance is 21.4 ml/min (by C-G formula based on Cr of 5.4). ------------------------------------------------------------------------------------------------------------------ No results found for this basename: HGBA1C:2 in the last 72 hours ------------------------------------------------------------------------------------------------------------------ No results found for this basename: CHOL:2,HDL:2,LDLCALC:2,TRIG:2,CHOLHDL:2,LDLDIRECT:2 in the last 72 hours ------------------------------------------------------------------------------------------------------------------ No results found for this basename: TSH,T4TOTAL,FREET3,T3FREE,THYROIDAB in the last 72 hours ------------------------------------------------------------------------------------------------------------------ No results found for this basename: VITAMINB12:2,FOLATE:2,FERRITIN:2,TIBC:2,IRON:2,RETICCTPCT:2 in the last 72 hours  Coagulation profile No results found for this basename: INR:5,PROTIME:5 in the last 168 hours  No results found for this basename: DDIMER:2 in the last 72 hours  Cardiac Enzymes No  results found for this basename: CK:3,CKMB:3,TROPONINI:3,MYOGLOBIN:3 in the last 168 hours ------------------------------------------------------------------------------------------------------------------ No components found with this basename: POCBNP:3  Micro Results Recent  Results (from the past 240 hour(s))  CULTURE, BLOOD (ROUTINE X 2)     Status: Normal (Preliminary result)   Collection Time   08/02/11  3:52 PM      Component Value Range Status Comment   Specimen Description BLOOD HEMODIALYSIS CATHETER   Final    Special Requests     Final    Value: BOTTLES DRAWN AEROBIC AND ANAEROBIC 10CC RIGHT IJ CATH   Culture  Setup Time [id number]   Final    Culture     Final    Value:        BLOOD CULTURE RECEIVED NO GROWTH TO DATE CULTURE WILL BE HELD FOR 5 DAYS BEFORE ISSUING A FINAL NEGATIVE REPORT   Report Status PENDING   Incomplete   CULTURE, BLOOD (ROUTINE X 2)     Status: Normal (Preliminary result)   Collection Time   08/02/11  5:40 PM      Component Value Range Status Comment   Specimen Description BLOOD HEMODIALYSIS CATHETER   Final    Special Requests BOTTLES DRAWN AEROBIC AND ANAEROBIC 10CC RIGHT IJ   Final    Culture  Setup Time [id number]   Final    Culture     Final    Value:        BLOOD CULTURE RECEIVED NO GROWTH TO DATE CULTURE WILL BE HELD FOR 5 DAYS BEFORE ISSUING A FINAL NEGATIVE REPORT   Report Status PENDING   Incomplete     Radiology Reports Dg Chest 2 View  08/02/2011  *RADIOLOGY REPORT*  Clinical Data: History of difficulty breathing.  History of fever.  CHEST - 2 VIEW  Comparison: 07/22/2011.  08/01/2011.  Findings: Right sided dialysis catheter has tips in right atrial region.  There is stable cardiac silhouette enlargement.  There is upper lobe vascular prominence with pulmonary venous hypertension vascular congestion pattern.  No consolidation or definite pleural effusion is seen.  There is slight elevation of the margin of the left hemidiaphragm with minimal basilar atelectasis. On the lateral image there appears to be some posterior atelectasis and patchy infiltrative density without consolidation.  There is minimal degenerative spondylosis.  IMPRESSION: Dialysis catheter in place.  Stable cardiac silhouette enlargement. Pulmonary  venous hypertension vascular congestion pattern.  No consolidation or pleural effusion. Left basilar and posterior atelectasis and patchy infiltrative densities without consolidation.  Original Report Authenticated By: Delane Ginger, M.D.   Dg Chest 2 View  07/22/2011  *RADIOLOGY REPORT*  Clinical Data: Fever for 1 week with cough, shortness of breath, hypertension and diabetes  CHEST - 2 VIEW  Comparison: 10/19/2004  Findings: Heart and mediastinal contours are stable with an unchanged degree of mild cardiomegaly.  The lung fields are notable for mild pulmonary vascular congestion.  No overt congestive failure, interstitial septal lines or significant peribronchial cuffing is seen to suggest early interstitial edema. A slight increase in interstitial markings are seen diffusely and may indicate underlying viral pneumonitis.  No focal infiltrates are seen to suggest an area of focal bronchopneumonia.  No pleural fluid is seen.  Bony structures appear intact.  IMPRESSION: Mild pulmonary vascular congestion and mild increase in interstitial markings suspicious for viral pneumonitis given history of fever.  No findings suggestive of bronchopneumonia.  Original Report Authenticated By: Ander Gaster, M.D.   US Abdomen Limited  07/14/2011  *RADIOLOGY REPORT*  Clinical  Data:  Elevated liver function tests, history of renal transplantation  LIMITED ABDOMINAL ULTRASOUND - RIGHT UPPER QUADRANT  Comparison:  CT abdomen of 08/26/2004  Findings:  Gallbladder:  The gallbladder is visualized and no gallstones are noted.  There is no pain over the gallbladder with compression.  Common bile duct:  The common bile duct is normal measuring 5.0 mm in diameter.  Liver:  The liver is echogenic consistent with fatty infiltration. Also the liver measures 20.1 cm sagittally indicating hepatomegaly. No ductal dilatation is seen.  IMPRESSION:  1.  Fatty infiltration of the liver.  Hepatomegaly. 2.  No gallstones.                    Original Report Authenticated By: Joretta Bachelor, M.D.   Dg Chest Port 1 View  08/01/2011  *RADIOLOGY REPORT*  Clinical Data: Central line placement.  PORTABLE CHEST - 1 VIEW  Comparison: 07/22/2011.  Findings: 1504 hours.  Right IJ dialysis catheter tips are present within the right atrium.  There is stable cardiomegaly with chronic vascular congestion.  No confluent airspace opacity, significant pleural effusion or pneumothorax is seen.  IMPRESSION: Dialysis catheter placement without demonstrated pneumothorax. Stable cardiomegaly and vascular congestion.  Original Report Authenticated By: Vivia Ewing, M.D.     Scheduled Meds:    . benzonatate  100 mg Oral TID  . calcium acetate  1,334 mg Oral TID WC  . cycloSPORINE modified  100 mg Oral BID  . darbepoetin (ARANESP) injection - DIALYSIS  150 mcg Intravenous Q Tue-HD  . insulin aspart  0-15 Units Subcutaneous TID WC  . insulin aspart  8 Units Subcutaneous TID WC  . insulin glargine  15 Units Subcutaneous BID  . isosorbide mononitrate  60 mg Oral Daily  . labetalol  100 mg Oral BID  . levothyroxine  75 mcg Oral QAC breakfast  . LORazepam  1 mg Intravenous Once  . metoCLOPramide  5 mg Oral TID PC & HS  . multivitamin  1 tablet Oral Daily  . pantoprazole  40 mg Oral Q1200  . paricalcitol      . paricalcitol  2 mcg Intravenous Q T,Th,Sa-HD  . predniSONE  5 mg Oral Q breakfast  . rOPINIRole  1 mg Oral QHS  . sodium chloride  3 mL Intravenous Q12H   Continuous Infusions:  PRN Meds:.acetaminophen, acetaminophen, albuterol, ALPRAZolam, alum & mag hydroxide-simeth, guaiFENesin-dextromethorphan, heparin, heparin, heparin, HYDROcodone-acetaminophen, HYDROmorphone, menthol-cetylpyridinium, ondansetron (ZOFRAN) IV, ondansetron, promethazine  Assessment & Plan   46 year old man presented to short stay for iron infusion. Was found to have a hemoglobin of 6.7 and referred to the emergency department. There is also some concern of fever upon  admission thought to be Viral URI at that time, however fever resolved immediately an initial infectious workup was unremarkable. He has Anemia (AOCD) and had PRBC transfusion initially,  he remained hospitalized because of worsening renal failure - when this failed to significantly improved the patient underwent AV graft placement and temporary dialysis catheter placement and was started on hemodialysis April 8. He developed fever and April 8 for the first time since admission. Disposition is pending resolution of fever an outpatient establishment of hemodialysis.      Past medical history:  End-stage renal disease secondary to diabetic nephropathy, status post renal transplant 1995, refractory anemia (on Procrit and iron infusion as an outpatient), fibromyalgia.     Consultants:  Nephrology  Vascular surgery   Procedures:  March 30: 2-D echocardiogram: Left ventricular  ejection fraction 55-60%. Normal wall motion.  March 29: Transfused 2 units packed blood cells.  April 4: Right arm and central venogram  April 5: Creation of right brachial artery to cephalic vein AV fistula  April 5: Transfusion one unit packed red blood cells.  April 8: Right IJ hemodialysis catheter    1. Fever: Resolved since admission until recurrence April 8 in the evening. Etiology unclear. On initial presentation it was presumed that his fever was secondary to viral pneumonitis. Influenza was negative. No fever recurred until April 8.  Appreciate Dr. Tommy Medal infectious disease input, pCT A&P suggests ? Lymphoma, will get CT guided biopsy, ID following, ABX per ID if needed.     2. Acute/chronic renal failure: Hemodialysis initiated April 8. Status post AV fistula April 5. Renal following.     3. Hyperkalemia: Resolved. Secondary to acute renal failure. Not on potassium supplementation, heparin products or ACE-I/ARB.      4. Refractory anemia: Stable. Fecal occult blood negative. Status post total of 5  units packed red blood cells since admission. Will check CBC again, if HB <7 will give 2 units with next dialysis.    5. Aspirin remains on hold. Management per nephrology (followed and managed as an outpatient by Dr. Mercy Moore).      6. Diabetes mellitus: Stable. Adjusted Lantus to twice a day dose with good effect and ISS, closely monitor PM CBGs  CBG (last 3)   Basename 08/05/11 0732 08/04/11 2112 08/04/11 1744  GLUCAP 146* 131* 186*       7. History of renal transplantation with allograft nephropathy: Per nephrology. Continue cyclosporin and prednisone.     8. Obstructive sleep apnea: Patient refuses CPAP.On and off confused in am likely retains, have counseled to start wearing it, will order QHS CPAP.     9. Mild delirium - as in #8, patient refused CT brain, stable UA, CXR. + QHS CPAP encouraged again.    DVT Prophylaxis   SCDs     [name] M.D on 08/05/2011 at 8:56 AM  [place]  [phone]

## 2011-08-05 NOTE — Progress Notes (Signed)
   CARE MANAGEMENT NOTE 08/05/2011  Patient:  Dale [name], Dale [name]   Account Number:  [id number]  Date Initiated:  07/25/2011  Documentation initiated by:  [name]  Subjective/Objective Assessment:   PT WAS ADMITTED WITH ANEMIA     Action/Plan:   PROGRESSION OF CARE AND DISCHARGE PLANNING   Anticipated DC Date:     Anticipated DC Plan:        DC Planning Services  CM consult      Choice offered to / List presented to:             Status of service:  In process, will continue to follow Medicare Important Message given?   (If response is "NO", the following Medicare IM given date fields will be blank) Date Medicare IM given:   Date Additional Medicare IM given:    Discharge Disposition:    Per UR Regulation:  Reviewed for med. necessity/level of care/duration of stay  If discussed at [place] of Stay Meetings, dates discussed:   08/03/2011    Comments:  08/05/11 [name], RN, BSN 1610 UR COMPLETED.  PT IS TO HAVE A NEEDLE BX TODAY.  PT HAS BEEN DENIED BY [facility] AND [facility] HAS NO HD BEDS AT THIS TIME.  WILL F/U WITH [facility] ON MONDAY.  08/03/11 Ariyel Jeangilles, RN, BSN 1655 PT'S WIFE HAS STATED THAT SHE CANT TAKE CARE OF HIM AT DC IF HE CONTS TO BE AS WEAK.  PT/OT TO EVAL, CIR WILL LOOK AT HIM IF IT IS RECOMMENDED.  UR COMPLETED 08/02/11 [name], RN, BNS 1239  UR COMPLETED 07/29/11 [name], RN, BSN [place] 07/25/11 [name], RN, BSN [phone]

## 2011-08-05 NOTE — Procedures (Signed)
US-guided left inguinal lymph node biopsy.  5 core samples placed in saline.  No immediate complication.

## 2011-08-05 NOTE — Progress Notes (Signed)
[facility]  Subjective:  Sleepy (just got "med for nausea" acc to wife).     Objective: Vital signs in last 24 hours: Blood pressure 124/75, pulse 81, temperature 98 F (36.7 C), temperature source Oral, resp. rate 18, height 5\' 8"  (1.727 m), weight 116 kg (255 lb 11.7 oz), SpO2 100.00%.    PHYSICAL EXAM General--sleepy Chest--cath R IJ, clear Heart--no rub Abd--nontender Extr--L BKA, R upper arm AVF, less edema R leg  Lab Results:   Lab 08/04/11 1020 08/03/11 0510 08/02/11 1531 08/01/11 1846 07/30/11 0430  NA 131* 132* 133* -- --  K 3.5 3.9 3.8 -- --  CL 94* 94* 97 -- --  CO2 23 25 23  -- --  BUN 84* 66* 107* -- --  CREATININE 5.40* 3.90* 4.64* -- --  ALB -- -- -- -- --  GLUCOSE 51* -- -- -- --  CALCIUM 8.7 8.7 8.8 -- --  PHOS 8.0* -- -- 8.5* 7.2*     Basename 08/04/11 1020 08/03/11 0510  WBC 11.9* 10.7*  HGB 7.0* 7.3*  HCT 23.3* 24.8*  PLT 349 351     Scheduled: Continuous:   Assessment/Plan: 1. Failing renal transplant--continue anti-rejection meds (cyclosporine & prednisone), continue HD T-Th-Sat 2. Fluid overload--HD again tomorrow--try for weight 114 kg  3. CKD-    "  4. DM-continue insulin, CBG  5. Secondary PTH-- PTH 172.4, phosphorus 8.5. Increase phosLo to 2 TID. Was on zemplar 1 mcg/d PTA. Will begin 2 mcg each HD  6. Anemia-- FeTIBC 11 %, but ferritin 2190. Begin aranesp 150/wk. May need PRBC if hgb drops< 7  7. High BP-- reduce labetalol to 100 BID 8.  Fever--T 101 at midnight.  CT scan shows new retroperitoneal and mesenteric lymphadenopathy. Needle  bx in IR today.     LOS: 14 days   Darwyn Ponzo F 08/05/2011,2:29 PM   .labalb

## 2011-08-05 NOTE — Evaluation (Signed)
Occupational Therapy Evaluation Patient Details Name: Dale [name] MRN: [id number] DOB: 01/24/1966 Today's Date: 08/05/2011  Problem List:  Patient Active Problem List  Diagnoses  . CKD (chronic kidney disease)  . History of renal transplant  . S/P BKA (below knee amputation)  . Obstructive sleep apnea  . Dyspnea  . CHF (congestive heart failure)  . Anemia  . Diabetes mellitus    Past Medical History:  Past Medical History  Diagnosis Date  . Renal disease   . Diabetes mellitus    Past Surgical History:  Past Surgical History  Procedure Date  . Appendectomy   . Hernia repair   . Kidney transplant   . Cataract extraction   . Arteriovenous graft placement   . Av fistula placement   . Toe amputation   . Below knee leg amputation   . Carpal tunnel release   . Shoulder surgery   . Av fistula placement 07/29/2011    Procedure: ARTERIOVENOUS (AV) FISTULA CREATION;  Surgeon: Mal Misty, MD;  Location: [place];  Service: Vascular;  Laterality: Right;  Brachial cephalic  . Insertion of dialysis catheter 08/01/2011    Procedure: INSERTION OF DIALYSIS CATHETER;  Surgeon: Rosetta Posner, MD;  Location: [place];  Service: Vascular;  Laterality: Right;  insertion of dialysis catheter on right internal jugular vein    OT Assessment/Plan/Recommendation OT Assessment Clinical Impression Statement: This 46 yo male admitted with increasing generalized weakness, fatigue, SOB worsening in last month, now on dialysis and looking into other issues for fevers  presents to acute OT with problems below thus affecing pt's PLOF at Mod I with BADLs. WIll benefit from acute OT with follow up [place] (SNF if doesnt progress as I suspect he will) OT Recommendation/Assessment: Patient will need skilled OT in the acute care venue OT Problem List: Decreased strength;Decreased activity tolerance;Cardiopulmonary status limiting activity;Pain Problem List Comments: DOE however sats stayed at 94 on RA Barriers to  Discharge:  (possibly decreased caregiver support) OT Therapy Diagnosis : Generalized weakness;Acute pain OT Plan OT Frequency: Min 2X/week OT Treatment/Interventions: Self-care/ADL training;Therapeutic activities;Patient/family education;Balance training OT Recommendation Follow Up Recommendations: Home health OT (may need SNF if does not progress as quickly as I think.) Equipment Recommended: 3 in 1 bedside comode (possibly tub transfer bench) Individuals Consulted Consulted and Agree with Results and Recommendations: Patient OT Goals Acute Rehab OT Goals OT Goal Formulation: With patient Time For Goal Achievement: 2 weeks ADL Goals Pt Will Perform Grooming: Independently;Standing at sink;Unsupported (At least 2 tasks) ADL Goal: Grooming - Progress: Goal set today Pt Will Perform Upper Body Bathing: with set-up;Unsupported;Sitting, edge of bed ADL Goal: Upper Body Bathing - Progress: Goal set today Pt Will Perform Lower Body Bathing: with set-up;Sit to stand from bed;Unsupported ADL Goal: Lower Body Bathing - Progress: Goal set today Pt Will Perform Upper Body Dressing: with set-up;Supported;Sitting, bed ADL Goal: Upper Body Dressing - Progress: Goal set today Pt Will Perform Lower Body Dressing: with set-up;Unsupported;Sit to stand from bed ADL Goal: Lower Body Dressing - Progress: Goal set today Pt Will Transfer to Toilet: with modified independence;Comfort height toilet;Grab bars;with DME;Ambulation ADL Goal: Toilet Transfer - Progress: Goal set today Pt Will Perform Toileting - Clothing Manipulation: Independently;Standing ADL Goal: Toileting - Clothing Manipulation - Progress: Goal set today Pt Will Perform Toileting - Hygiene: Independently;Sitting on 3-in-1 or toilet ADL Goal: Toileting - Hygiene - Progress: Goal set today  OT Evaluation Precautions/Restrictions  Precautions Precautions: Fall Required Braces or Orthoses: Other Brace/Splint Other Brace/Splint:  LLE  prothesis Restrictions Weight Bearing Restrictions: No Prior Functioning Home Living Lives With: Spouse Available Help at Discharge:  (wife works during the day) Type of Home: House Home Access: Ramped entrance (rails both sides) Home Layout: One level Bathroom Shower/Tub: Product/process development scientist: Standard Bathroom Accessibility: Yes How Accessible: Accessible via wheelchair;Accessible via [place]: Hand-held shower hose;Shower chair with back;Wheelchair - manual (2 manuel WCs and a adjustable bed) Prior Function Level of Independence: Independent with assistive device(s) (uses WC in house mostly but does have prothesis on) Able to Take Stairs?: No Driving: No Vocation: On disability  ADL ADL Eating/Feeding: Simulated;Independent Where Assessed - Eating/Feeding: Bed level Grooming: Simulated;Set up;Supervision/safety Where Assessed - Grooming: Sitting, bed;Unsupported Upper Body Bathing: Simulated;Supervision/safety;Set up Where Assessed - Upper Body Bathing: Unsupported;Sitting, bed Lower Body Bathing: Simulated;Supervision/safety;Set up (with minguard A sit to stand from bed (raised)) Where Assessed - Lower Body Bathing: Unsupported;Sit to stand from bed Upper Body Dressing: Simulated;Supervision/safety;Set up Where Assessed - Upper Body Dressing: Unsupported;Sitting, bed Lower Body Dressing: Simulated;Supervision/safety;Set up (with minguard A sit to stand from bed (raised)) Lower Body Dressing Details (indicate cue type and reason): able to don/doff LLE prothesis and right shoe post set up Where Assessed - Lower Body Dressing: Unsupported;Sit to stand from bed Toilet Transfer: Simulated (min guard A) Toilet Transfer Method: Ambulating (with RW, bed couple of steps forward and back) Toileting - Clothing Manipulation: Simulated (minguard A) Where Assessed - Toileting Clothing Manipulation: Standing Toileting - Hygiene:  Simulated;Independent Where Assessed - Toileting Hygiene: Sit on 3-in-1 or toilet Tub/Shower Transfer: Not assessed Equipment Used:  (RW, gait belt) Ambulation Related to ADLs: Min guard A Vision/Perception  Vision - History Patient Visual Report: No change from baseline Cognition Cognition Arousal/Alertness: Lethargic (pain meds) Overall Cognitive Status: Appears within functional limits for tasks assessed Cognition - Other Comments: was having some halluncinations Sensation/Coordination Coordination Gross Motor Movements are Fluid and Coordinated: Yes Fine Motor Movements are Fluid and Coordinated: Yes Extremity Assessment RUE Assessment RUE Assessment: Within Functional Limits LUE Assessment LUE Assessment: Within Functional Limits Mobility  Bed Mobility Bed Mobility: Yes Supine to Sit: 5: Supervision;With rails;HOB elevated (Comment degrees) (20 degrees) Sitting - Scoot to Edge of Bed: 6: Modified independent (Device/Increase time);With rail Transfers Transfers: Yes Sit to Stand: From elevated surface;Without upper extremity assist;From bed (min gurad A) Stand to Sit: With upper extremity assist;To bed (min guard A) Exercises   End of Session OT - End of Session Equipment Utilized During Treatment: Gait belt (RW) Activity Tolerance: Patient limited by fatigue Patient left: in bed;with call bell in reach Nurse Communication: Mobility status for transfers (RN--[place]) General Behavior During Session: Lethargic Cognition: WFL for tasks performed   Almon Register [id number] 08/05/2011, 10:45 AM

## 2011-08-05 NOTE — Evaluation (Signed)
Physical Therapy Evaluation Patient Details Name: Dale [name] MRN: [id number] DOB: 12-21-1965 Today's Date: 08/05/2011  Problem List:  Patient Active Problem List  Diagnoses  . CKD (chronic kidney disease)  . History of renal transplant  . S/P BKA (below knee amputation)  . Obstructive sleep apnea  . Dyspnea  . CHF (congestive heart failure)  . Anemia  . Diabetes mellitus    Past Medical History:  Past Medical History  Diagnosis Date  . Renal disease   . Diabetes mellitus    Past Surgical History:  Past Surgical History  Procedure Date  . Appendectomy   . Hernia repair   . Kidney transplant   . Cataract extraction   . Arteriovenous graft placement   . Av fistula placement   . Toe amputation   . Below knee leg amputation   . Carpal tunnel release   . Shoulder surgery   . Av fistula placement 07/29/2011    Procedure: ARTERIOVENOUS (AV) FISTULA CREATION;  Surgeon: Mal Misty, MD;  Location: [place];  Service: Vascular;  Laterality: Right;  Brachial cephalic  . Insertion of dialysis catheter 08/01/2011    Procedure: INSERTION OF DIALYSIS CATHETER;  Surgeon: Rosetta Posner, MD;  Location: [facility];  Service: Vascular;  Laterality: Right;  insertion of dialysis catheter on right internal jugular vein    PT Assessment/Plan/Recommendation PT Assessment Clinical Impression Statement: pt adm with progressive weakness and SOB.  Newly started on HD and on eval did present with general weakness and decr activity tolerance.  He was able to balance well at the EOB to donn his prosthesis and stood in a RW with no physical A.  Pt likely will be able to D/C home with HHPT, but due to family resources my need to D/C to SNF for short-term rehab before D/C home. PT Recommendation/Assessment: Patient will need skilled PT in the acute care venue PT Problem List: Decreased strength;Decreased activity tolerance;Decreased balance;Decreased mobility;Decreased cognition;Cardiopulmonary status limiting  activity;Decreased knowledge of precautions;Pain Barriers to Discharge: Decreased caregiver support (wife works days and might have to take FMLA) PT Therapy Diagnosis : Generalized weakness;Acute pain PT Plan PT Frequency: Min 3X/week PT Treatment/Interventions: DME instruction;Gait training;Functional mobility training;Therapeutic activities;Therapeutic exercise;Balance training;Patient/family education PT Recommendation Follow Up Recommendations: Skilled nursing facility;Home health PT (based on wife's ability to S/A pt.) Equipment Recommended: 3 in 1 bedside comode (possibly tub transfer bench) PT Goals  Acute Rehab PT Goals PT Goal Formulation: With patient Time For Goal Achievement: 7 days Pt will go Supine/Side to Sit: with modified independence PT Goal: Supine/Side to Sit - Progress: Goal set today Pt will go Sit to Stand: with modified independence;with upper extremity assist PT Goal: Sit to Stand - Progress: Goal set today Pt will Transfer Bed to Chair/Chair to Bed: with modified independence PT Transfer Goal: Bed to Chair/Chair to Bed - Progress: Goal set today Pt will Ambulate: 16 - 50 feet;with modified independence;with least restrictive assistive device PT Goal: Ambulate - Progress: Goal set today  PT Evaluation Precautions/Restrictions  Precautions Precautions: Fall Required Braces or Orthoses: Other Brace/Splint Other Brace/Splint: LLE prothesis Restrictions Weight Bearing Restrictions: No Prior Functioning  Home Living Lives With: Spouse Available Help at Discharge:  (wife works during the day; may be able to take some FMLA) Type of Home: House Home Access: Ramped entrance (rails both sides) Home Layout: One level Bathroom Shower/Tub: Tub/shower unit;Curtain Biochemist, clinical: Standard Bathroom Accessibility: Yes How Accessible: Accessible via wheelchair;Accessible via walker Home Adaptive Equipment: Hand-held shower hose;Shower chair  with back;Wheelchair -  manual (2 manual WC) Prior Function Level of Independence: Independent with assistive device(s) (uses WC in house mostly but does have prothesis on) Able to Take Stairs?: No Driving: No Vocation: On disability Cognition Cognition Arousal/Alertness: Lethargic Overall Cognitive Status: Appears within functional limits for tasks assessed Orientation Level: Oriented to person;Oriented to place;Oriented to time Cognition - Other Comments: was having some halluncinations Sensation/Coordination Coordination Gross Motor Movements are Fluid and Coordinated: Yes Fine Motor Movements are Fluid and Coordinated: Yes Extremity Assessment RUE Assessment RUE Assessment: Within Functional Limits LUE Assessment LUE Assessment: Within Functional Limits RLE Assessment RLE Assessment: Within Functional Limits LLE Assessment LLE Assessment: Within Functional Limits Mobility (including Balance) Bed Mobility Bed Mobility: Yes Supine to Sit: 5: Supervision Supine to Sit Details (indicate cue type and reason): used rail, but with flat bed to sit up via left elbow Sitting - Scoot to Edge of Bed: 6: Modified independent (Device/Increase time) Transfers Transfers: Yes Sit to Stand: Other (comment);From bed;With upper extremity assist (min guard A) Stand to Sit: With upper extremity assist;To bed;Other (comment) (min guard A) Ambulation/Gait Ambulation/Gait: Yes Ambulation/Gait Assistance: Other (comment) (min guard assist) Ambulation Distance (Feet): 5 Feet (forwar and 5' back with RW, managed well by pt.) Assistive device: Rolling walker Gait Pattern: Step-to pattern;Decreased stride length Stairs: No Wheelchair Mobility Wheelchair Mobility: No  Posture/Postural Control Posture/Postural Control: No significant limitations Balance Balance Assessed: Yes Static Sitting Balance Static Sitting - Balance Support: No upper extremity supported;Feet supported;Feet unsupported Static Sitting - Level of  Assistance: 5: Stand by assistance Static Sitting - Comment/# of Minutes: 15 Exercise    End of Session PT - End of Session Activity Tolerance: Patient tolerated treatment well;Patient limited by pain Patient left: in bed;with call bell in reach Nurse Communication: Mobility status for transfers;Mobility status for ambulation General Behavior During Session: Lethargic Cognition: WFL for tasks performed   Ina Poupard, Tessie Fass 08/05/2011, 11:16 AM  08/05/2011  Donnella Sham, PT [phone] (pager)

## 2011-08-06 ENCOUNTER — Inpatient Hospital Stay (HOSPITAL_COMMUNITY): Payer: Medicare Other

## 2011-08-06 LAB — RENAL FUNCTION PANEL
Albumin: 1.3 g/dL — ABNORMAL LOW (ref 3.5–5.2)
BUN: 68 mg/dL — ABNORMAL HIGH (ref 6–23)
CO2: 22 mEq/L (ref 19–32)
Calcium: 8.7 mg/dL (ref 8.4–10.5)
Chloride: 93 mEq/L — ABNORMAL LOW (ref 96–112)
Creatinine, Ser: 4.81 mg/dL — ABNORMAL HIGH (ref 0.50–1.35)
GFR calc Af Amer: 15 mL/min — ABNORMAL LOW (ref >90)
GFR calc non Af Amer: 13 mL/min — ABNORMAL LOW (ref >90)
Glucose, Bld: 250 mg/dL — ABNORMAL HIGH (ref 70–99)
Phosphorus: 6 mg/dL — ABNORMAL HIGH (ref 2.3–4.6)
Potassium: 4.3 mEq/L (ref 3.5–5.1)
Sodium: 129 mEq/L — ABNORMAL LOW (ref 135–145)

## 2011-08-06 LAB — CBC
HCT: 29.1 % — ABNORMAL LOW (ref 39.0–52.0)
Hemoglobin: 8.9 g/dL — ABNORMAL LOW (ref 13.0–17.0)
MCH: 26.6 pg (ref 26.0–34.0)
MCHC: 30.6 g/dL (ref 30.0–36.0)
MCV: 86.9 fL (ref 78.0–100.0)
Platelets: 309 10*3/uL (ref 150–400)
RBC: 3.35 MIL/uL — ABNORMAL LOW (ref 4.22–5.81)
RDW: 18.8 % — ABNORMAL HIGH (ref 11.5–15.5)
WBC: 9.5 10*3/uL (ref 4.0–10.5)

## 2011-08-06 LAB — LACTATE DEHYDROGENASE: LDH: 168 U/L (ref 94–250)

## 2011-08-06 LAB — GLUCOSE, CAPILLARY
Glucose-Capillary: 239 mg/dL — ABNORMAL HIGH (ref 70–99)
Glucose-Capillary: 118 mg/dL — ABNORMAL HIGH (ref 70–99)
Glucose-Capillary: 129 mg/dL — ABNORMAL HIGH (ref 70–99)
Glucose-Capillary: 111 mg/dL — ABNORMAL HIGH (ref 70–99)
Glucose-Capillary: 115 mg/dL — ABNORMAL HIGH (ref 70–99)

## 2011-08-06 LAB — URINE CULTURE
Colony Count: >=100000
Culture  Setup Time: 201304111730

## 2011-08-06 MED ORDER — LORAZEPAM 2 MG/ML IJ SOLN
1.0000 mg | Freq: Once | INTRAMUSCULAR | Status: AC
  Administered 2011-08-06: 1 mg via INTRAVENOUS

## 2011-08-06 MED ORDER — LORAZEPAM 2 MG/ML IJ SOLN
INTRAMUSCULAR | Status: AC
  Administered 2011-08-06: 1 mg via INTRAVENOUS
  Filled 2011-08-06: qty 1

## 2011-08-06 MED ORDER — PARICALCITOL 5 MCG/ML IV SOLN
INTRAVENOUS | Status: AC
  Administered 2011-08-06: 2 ug via INTRAVENOUS
  Filled 2011-08-06: qty 1

## 2011-08-06 NOTE — Progress Notes (Signed)
Dale [name] [id number] is a 46 y.o. male,  Outpatient Primary MD for the patient is No primary provider on file.  Chief Complaint  Patient presents with  . Weakness  . Shortness of Breath        Subjective:   Dale [name] today has, No headache, No chest pain, No abdominal pain - No Nausea, No new weakness tingling or numbness, some intermittent Cough - No SOB.   Objective:   Filed Vitals:   08/06/11 1239 08/06/11 1245 08/06/11 1300 08/06/11 1330  BP: 139/69 134/76 140/65 136/68  Pulse: 87 86 86 87  Temp:      TempSrc:      Resp: 15 26 22 22   Height:      Weight:      SpO2:        Wt Readings from Last 3 Encounters:  08/06/11 116.7 kg (257 lb 4.4 oz)  08/06/11 116.7 kg (257 lb 4.4 oz)  08/06/11 116.7 kg (257 lb 4.4 oz)     Intake/Output Summary (Last 24 hours) at 08/06/11 1409 Last data filed at 08/06/11 0937  Gross per 24 hour  Intake    240 ml  Output      0 ml  Net    240 ml    Exam Awake mild delirium No new F.N deficits, Normal affect [place].AT,PERRAL Supple Neck,No JVD, No cervical lymphadenopathy appriciated.  Symmetrical Chest wall movement, Good air movement bilaterally, CTAB RRR,No Gallops,Rubs or new Murmurs, No Parasternal Heave +ve B.Sounds, Abd Soft, Non tender, No organomegaly appriciated, No rebound -guarding or rigidity. 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   Data Review  CBC  Lab 08/06/11 0600 08/04/11 1020 08/03/11 0510 08/02/11 1531 08/01/11 0455  WBC 9.5 11.9* 10.7* 12.4* 10.1  HGB 8.9* 7.0* 7.3* 7.4* 7.5*  HCT 29.1* 23.3* 24.8* 24.7* 24.5*  PLT 309 349 351 429* 403*  MCV 86.9 87.6 90.2 88.5 88.8  MCH 26.6 26.3 26.5 26.5 27.2  MCHC 30.6 30.0 29.4* 30.0 30.6  RDW 18.8* 19.1* 19.4* 19.5* 19.6*  LYMPHSABS -- -- -- -- --  MONOABS -- -- -- -- --  EOSABS -- -- -- -- --  BASOSABS -- -- -- -- --  BANDABS -- -- -- -- --    Chemistries   Lab 08/06/11 0600 08/04/11 1020 08/03/11 0510 08/02/11 1531 08/01/11 1846  NA 129* 131* 132* 133*  133*  K 4.3 3.5 3.9 3.8 4.8  CL 93* 94* 94* 97 96  CO2 22 23 25 23 20   GLUCOSE 250* 51* 333* 263* 173*  BUN 68* 84* 66* 107* 137*  CREATININE 4.81* 5.40* 3.90* 4.64* 4.59*  CALCIUM 8.7 8.7 8.7 8.8 9.2  MG -- -- -- -- --  AST -- -- -- -- --  ALT -- -- -- -- --  ALKPHOS -- -- -- -- --  BILITOT -- -- -- -- --   ------------------------------------------------------------------------------------------------------------------ estimated creatinine clearance is 24.1 ml/min (by C-G formula based on Cr of 4.81). ------------------------------------------------------------------------------------------------------------------ No results found for this basename: HGBA1C:2 in the last 72 hours ------------------------------------------------------------------------------------------------------------------ No results found for this basename: CHOL:2,HDL:2,LDLCALC:2,TRIG:2,CHOLHDL:2,LDLDIRECT:2 in the last 72 hours ------------------------------------------------------------------------------------------------------------------ No results found for this basename: TSH,T4TOTAL,FREET3,T3FREE,THYROIDAB in the last 72 hours ------------------------------------------------------------------------------------------------------------------ No results found for this basename: VITAMINB12:2,FOLATE:2,FERRITIN:2,TIBC:2,IRON:2,RETICCTPCT:2 in the last 72 hours  Coagulation profile  Lab 08/05/11 1109  INR 1.63*  PROTIME --    No results found for this basename: DDIMER:2 in the last 72 hours  Cardiac Enzymes No results found for this basename: CK:3,CKMB:3,TROPONINI:3,MYOGLOBIN:3  in the last 168 hours ------------------------------------------------------------------------------------------------------------------ No components found with this basename: POCBNP:3  Micro Results Recent Results (from the past 240 hour(s))  CULTURE, BLOOD (ROUTINE X 2)     Status: Normal (Preliminary result)   Collection Time     08/02/11  3:52 PM      Component Value Range Status Comment   Specimen Description BLOOD HEMODIALYSIS CATHETER   Final    Special Requests     Final    Value: BOTTLES DRAWN AEROBIC AND ANAEROBIC 10CC RIGHT IJ CATH   Culture  Setup Time [id number]   Final    Culture     Final    Value:        BLOOD CULTURE RECEIVED NO GROWTH TO DATE CULTURE WILL BE HELD FOR 5 DAYS BEFORE ISSUING A FINAL NEGATIVE REPORT   Report Status PENDING   Incomplete   CULTURE, BLOOD (ROUTINE X 2)     Status: Normal (Preliminary result)   Collection Time   08/02/11  5:40 PM      Component Value Range Status Comment   Specimen Description BLOOD HEMODIALYSIS CATHETER   Final    Special Requests BOTTLES DRAWN AEROBIC AND ANAEROBIC 10CC RIGHT IJ   Final    Culture  Setup Time [id number]   Final    Culture     Final    Value:        BLOOD CULTURE RECEIVED NO GROWTH TO DATE CULTURE WILL BE HELD FOR 5 DAYS BEFORE ISSUING A FINAL NEGATIVE REPORT   Report Status PENDING   Incomplete   URINE CULTURE     Status: Normal   Collection Time   08/04/11  4:11 PM      Component Value Range Status Comment   Specimen Description URINE, RANDOM   Final    Special Requests NONE   Final    Culture  Setup Time [id number]   Final    Colony Count >=100,000 COLONIES/ML   Final    Culture YEAST   Final    Report Status 08/06/2011 FINAL   Final   AFB CULTURE WITH SMEAR     Status: Normal (Preliminary result)   Collection Time   08/05/11  4:52 PM      Component Value Range Status Comment   Specimen Description TISSUE LYMPH NODE   Final    Special Requests NONE   Final    ACID FAST SMEAR NO ACID FAST BACILLI SEEN   Final    Culture     Final    Value: CULTURE WILL BE EXAMINED FOR 6 WEEKS BEFORE ISSUING A FINAL REPORT   Report Status PENDING   Incomplete   TISSUE CULTURE     Status: Normal (Preliminary result)   Collection Time   08/05/11  4:52 PM      Component Value Range Status Comment   Specimen Description TISSUE LYMPH NODE    Final    Special Requests NONE   Final    Gram Stain     Final    Value: NO WBC SEEN     NO SQUAMOUS EPITHELIAL CELLS SEEN     NO ORGANISMS SEEN   Culture NO GROWTH   Final    Report Status PENDING   Incomplete     Radiology Reports Dg Chest 2 View  08/02/2011  *RADIOLOGY REPORT*  Clinical Data: History of difficulty breathing.  History of fever.  CHEST - 2 VIEW  Comparison: 07/22/2011.  08/01/2011.  Findings: Right sided dialysis  catheter has tips in right atrial region.  There is stable cardiac silhouette enlargement.  There is upper lobe vascular prominence with pulmonary venous hypertension vascular congestion pattern.  No consolidation or definite pleural effusion is seen.  There is slight elevation of the margin of the left hemidiaphragm with minimal basilar atelectasis. On the lateral image there appears to be some posterior atelectasis and patchy infiltrative density without consolidation.  There is minimal degenerative spondylosis.  IMPRESSION: Dialysis catheter in place.  Stable cardiac silhouette enlargement. Pulmonary venous hypertension vascular congestion pattern.  No consolidation or pleural effusion. Left basilar and posterior atelectasis and patchy infiltrative densities without consolidation.  Original Report Authenticated By: Delane Ginger, M.D.   Dg Chest 2 View  07/22/2011  *RADIOLOGY REPORT*  Clinical Data: Fever for 1 week with cough, shortness of breath, hypertension and diabetes  CHEST - 2 VIEW  Comparison: 10/19/2004  Findings: Heart and mediastinal contours are stable with an unchanged degree of mild cardiomegaly.  The lung fields are notable for mild pulmonary vascular congestion.  No overt congestive failure, interstitial septal lines or significant peribronchial cuffing is seen to suggest early interstitial edema. A slight increase in interstitial markings are seen diffusely and may indicate underlying viral pneumonitis.  No focal infiltrates are seen to suggest an area of  focal bronchopneumonia.  No pleural fluid is seen.  Bony structures appear intact.  IMPRESSION: Mild pulmonary vascular congestion and mild increase in interstitial markings suspicious for viral pneumonitis given history of fever.  No findings suggestive of bronchopneumonia.  Original Report Authenticated By: Ander Gaster, M.D.   US Abdomen Limited  07/14/2011  *RADIOLOGY REPORT*  Clinical Data:  Elevated liver function tests, history of renal transplantation  LIMITED ABDOMINAL ULTRASOUND - RIGHT UPPER QUADRANT  Comparison:  CT abdomen of 08/26/2004  Findings:  Gallbladder:  The gallbladder is visualized and no gallstones are noted.  There is no pain over the gallbladder with compression.  Common bile duct:  The common bile duct is normal measuring 5.0 mm in diameter.  Liver:  The liver is echogenic consistent with fatty infiltration. Also the liver measures 20.1 cm sagittally indicating hepatomegaly. No ductal dilatation is seen.  IMPRESSION:  1.  Fatty infiltration of the liver.  Hepatomegaly. 2.  No gallstones.                   Original Report Authenticated By: Joretta Bachelor, M.D.   Dg Chest Port 1 View  08/01/2011  *RADIOLOGY REPORT*  Clinical Data: Central line placement.  PORTABLE CHEST - 1 VIEW  Comparison: 07/22/2011.  Findings: 1504 hours.  Right IJ dialysis catheter tips are present within the right atrium.  There is stable cardiomegaly with chronic vascular congestion.  No confluent airspace opacity, significant pleural effusion or pneumothorax is seen.  IMPRESSION: Dialysis catheter placement without demonstrated pneumothorax. Stable cardiomegaly and vascular congestion.  Original Report Authenticated By: Vivia Ewing, M.D.     Scheduled Meds:    . benzonatate  100 mg Oral TID  . calcium acetate  1,334 mg Oral TID WC  . cycloSPORINE modified  100 mg Oral BID  . darbepoetin (ARANESP) injection - DIALYSIS  150 mcg Intravenous Q Tue-HD  . insulin aspart  0-15 Units Subcutaneous  TID WC  . insulin aspart  8 Units Subcutaneous TID WC  . insulin glargine  15 Units Subcutaneous BID  . isosorbide mononitrate  60 mg Oral Daily  . labetalol  100 mg Oral BID  . levothyroxine  75 mcg Oral QAC breakfast  . LORazepam  1 mg Intravenous Once  . metoCLOPramide  5 mg Oral TID PC & HS  . multivitamin  1 tablet Oral Daily  . pantoprazole  40 mg Oral Q1200  . paricalcitol  2 mcg Intravenous Q T,Th,Sa-HD  . predniSONE  5 mg Oral Q breakfast  . rOPINIRole  1 mg Oral QHS  . sodium chloride  3 mL Intravenous Q12H   Continuous Infusions:  PRN Meds:.acetaminophen, acetaminophen, albuterol, ALPRAZolam, alum & mag hydroxide-simeth, guaiFENesin-dextromethorphan, heparin, heparin, heparin, heparin, HYDROcodone-acetaminophen, HYDROmorphone, menthol-cetylpyridinium, ondansetron (ZOFRAN) IV, ondansetron, promethazine  Assessment & Plan   46 year old man presented to short stay for iron infusion. Was found to have a hemoglobin of 6.7 and referred to the emergency department. There is also some concern of fever upon admission thought to be Viral URI at that time, however fever resolved immediately an initial infectious workup was unremarkable. He has Anemia (AOCD) and had PRBC transfusion initially,  he remained hospitalized because of worsening renal failure - when this failed to significantly improved the patient underwent AV graft placement and temporary dialysis catheter placement and was started on hemodialysis April 8. He developed fever and April 8 for the first time since admission. Disposition is pending resolution of fever an outpatient establishment of hemodialysis.      Past medical history:  End-stage renal disease secondary to diabetic nephropathy, status post renal transplant 1995, refractory anemia (on Procrit and iron infusion as an outpatient), fibromyalgia.     Consultants:  Nephrology  Vascular surgery   Procedures:  March 30: 2-D echocardiogram: Left ventricular  ejection fraction 55-60%. Normal wall motion.  March 29: Transfused 2 units packed blood cells.  April 4: Right arm and central venogram  April 5: Creation of right brachial artery to cephalic vein AV fistula  April 5: Transfusion one unit packed red blood cells.  April 8: Right IJ hemodialysis catheter. April 12 CT guided Node biopsy by IR    1. Fever: Resolved since admission until recurrence April 8 in the evening. Etiology unclear. On initial presentation it was presumed that his fever was secondary to viral pneumonitis. Influenza was negative. No fever recurred until April 8.  Appreciate Dr. Tommy Medal infectious disease input, CT A&P suggests ? Lymphoma, post CT guided biopsy,await pathology report from biopsy,  ID following, ABX per ID if needed.     2. Is living Acute/chronic renal failure: Hemodialysis initiated April 8. Status post AV fistula April 5. Renal following.     3. Hyperkalemia: Resolved. Secondary to acute renal failure. Not on potassium supplementation, heparin products or ACE-I/ARB.      4. Refractory anemia: Stable. Fecal occult blood negative. Status post total of 5 units packed red blood cells since admission. Currently H&H appears to be stable we'll continue to monitor.    5. Diabetes mellitus: Stable. Adjusted Lantus to twice a day dose with good effect and ISS, closely monitor PM CBGs  CBG (last 3)   Basename 08/06/11 1200 08/06/11 0754 08/05/11 2324  GLUCAP 118* 239* 70       6. History of renal transplantation with allograft nephropathy: Per nephrology. Continue cyclosporin and prednisone.     7. Obstructive sleep apnea: Patient refuses CPAP.On and off confused in am likely retains, have counseled to start wearing it, will order QHS CPAP. Which patient continues not to wear.     8. Mild delirium - waxing and waning, as in #8, patient refused CT brain, stable UA, CXR. +  QHS CPAP encouraged again.    DVT Prophylaxis    SCDs     [name] M.D on 08/06/2011 at 2:09 PM  [facility] Office  [phone]

## 2011-08-06 NOTE — Procedures (Signed)
I was present at this dialysis session. I have reviewed the session itself and made appropriate changes.   Kelly Splinter, MD [name] 08/06/2011, 2:48 PM

## 2011-08-06 NOTE — Progress Notes (Signed)
[facility]  Subjective:  Sleepy (just got "med for nausea" acc to wife).     Objective: Vital signs in last 24 hours: Blood pressure 150/71, pulse 88, temperature 98.3 F (36.8 C), temperature source Oral, resp. rate 26, height 5\' 8"  (1.727 m), weight 116.7 kg (257 lb 4.4 oz), SpO2 96.00%.    PHYSICAL EXAM General--sleepy Chest--cath R IJ, clear Heart--no rub Abd--nontender Extr--L BKA, R upper arm AVF, less edema R leg  Lab Results:   Lab 08/06/11 0600 08/04/11 1020 08/03/11 0510 08/01/11 1846  NA 129* 131* 132* --  K 4.3 3.5 3.9 --  CL 93* 94* 94* --  CO2 22 23 25  --  BUN 68* 84* 66* --  CREATININE 4.81* 5.40* 3.90* --  ALB -- -- -- --  GLUCOSE 250* -- -- --  CALCIUM 8.7 8.7 8.7 --  PHOS 6.0* 8.0* -- 8.5*     Basename 08/06/11 0600 08/04/11 1020  WBC 9.5 11.9*  HGB 8.9* 7.0*  HCT 29.1* 23.3*  PLT 309 349      Assessment/Plan: 1. Failed renal transplant, new start to HD-  continue anti-rejection meds (cyclosporine & prednisone), continue HD T-Th-Sat. Has new R IJ TDC and AVF in right arm.  2. HTN/volume- labetalol reduced to 100 mg bid. BP 150/78.  3. DM-continue insulin, CBG  4. Secondary PTH-- PTH 172.4, phosphorus 8.5. Increase phosLo to 2 TID. Was on zemplar 1 mcg/d PTA. Will begin 2 mcg each HD  5. Anemia-- FeTIBC 11 %, but ferritin 2190. Begin aranesp 150/wk. Looks like he was transfused sometime recently, but not sure.  6. Fever/LAN- CT scan showed new retroperitoneal and mesenteric lymphadenopathy. LN Bx done yest by IR.     LOS: 15 days   Laconya Clere D 08/06/2011,2:34 PM

## 2011-08-06 NOTE — Progress Notes (Signed)
Spoke with patient regarding cpap for bedtime.  Pt stated that he prefers to use [name] tonight.  Pts home cpap at bedside.  Pt advised that RT available all night should he change his mind and need assistance.

## 2011-08-07 LAB — GLUCOSE, CAPILLARY
Glucose-Capillary: 218 mg/dL — ABNORMAL HIGH (ref 70–99)
Glucose-Capillary: 258 mg/dL — ABNORMAL HIGH (ref 70–99)
Glucose-Capillary: 155 mg/dL — ABNORMAL HIGH (ref 70–99)
Glucose-Capillary: 127 mg/dL — ABNORMAL HIGH (ref 70–99)
Glucose-Capillary: 154 mg/dL — ABNORMAL HIGH (ref 70–99)

## 2011-08-07 MED ORDER — DIPHENHYDRAMINE HCL 25 MG PO CAPS
25.0000 mg | ORAL_CAPSULE | Freq: Every evening | ORAL | Status: DC | PRN
  Administered 2011-08-07 – 2011-08-12 (×12): 25 mg via ORAL
  Filled 2011-08-07 (×18): qty 1

## 2011-08-07 NOTE — Progress Notes (Signed)
Dale [name] [id number] is a 46 y.o. male,  Outpatient Primary MD for the patient is No primary provider on file.  Chief Complaint  Patient presents with  . Weakness  . Shortness of Breath        Subjective:    Dale [name] today has, No headache, No chest pain, No abdominal pain - No Nausea, No new weakness tingling or numbness, some intermittent Cough - No SOB.    Objective:   Filed Vitals:   08/06/11 2055 08/07/11 0110 08/07/11 0454 08/07/11 0912  BP: 137/61 115/66 134/60 137/64  Pulse: 97 95 83 87  Temp: 99.9 F (37.7 C) 99.9 F (37.7 C) 97.6 F (36.4 C) 98 F (36.7 C)  TempSrc: Oral Oral Oral Oral  Resp: 22 20 18 17   Height:      Weight: 114 kg (251 lb 5.2 oz)     SpO2: 97% 97% 99% 99%    Wt Readings from Last 3 Encounters:  08/06/11 114 kg (251 lb 5.2 oz)  08/06/11 114 kg (251 lb 5.2 oz)  08/06/11 114 kg (251 lb 5.2 oz)     Intake/Output Summary (Last 24 hours) at 08/07/11 1017 Last data filed at 08/07/11 0912  Gross per 24 hour  Intake    480 ml  Output   2723 ml  Net  -2243 ml    Exam Awake mild delirium No new F.N deficits, Normal affect [place].AT,PERRAL Supple Neck,No JVD, No cervical lymphadenopathy appriciated.  Symmetrical Chest wall movement, Good air movement bilaterally, CTAB RRR,No Gallops,Rubs or new Murmurs, No Parasternal Heave +ve B.Sounds, Abd Soft, Non tender, No organomegaly appriciated, No rebound -guarding or rigidity. 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   Data Review  CBC  Lab 08/06/11 0600 08/04/11 1020 08/03/11 0510 08/02/11 1531 08/01/11 0455  WBC 9.5 11.9* 10.7* 12.4* 10.1  HGB 8.9* 7.0* 7.3* 7.4* 7.5*  HCT 29.1* 23.3* 24.8* 24.7* 24.5*  PLT 309 349 351 429* 403*  MCV 86.9 87.6 90.2 88.5 88.8  MCH 26.6 26.3 26.5 26.5 27.2  MCHC 30.6 30.0 29.4* 30.0 30.6  RDW 18.8* 19.1* 19.4* 19.5* 19.6*  LYMPHSABS -- -- -- -- --  MONOABS -- -- -- -- --  EOSABS -- -- -- -- --  BASOSABS -- -- -- -- --  BANDABS -- -- -- -- --     Chemistries   Lab 08/06/11 0600 08/04/11 1020 08/03/11 0510 08/02/11 1531 08/01/11 1846  NA 129* 131* 132* 133* 133*  K 4.3 3.5 3.9 3.8 4.8  CL 93* 94* 94* 97 96  CO2 22 23 25 23 20   GLUCOSE 250* 51* 333* 263* 173*  BUN 68* 84* 66* 107* 137*  CREATININE 4.81* 5.40* 3.90* 4.64* 4.59*  CALCIUM 8.7 8.7 8.7 8.8 9.2  MG -- -- -- -- --  AST -- -- -- -- --  ALT -- -- -- -- --  ALKPHOS -- -- -- -- --  BILITOT -- -- -- -- --   ------------------------------------------------------------------------------------------------------------------ estimated creatinine clearance is 23.8 ml/min (by C-G formula based on Cr of 4.81). ------------------------------------------------------------------------------------------------------------------ No results found for this basename: HGBA1C:2 in the last 72 hours ------------------------------------------------------------------------------------------------------------------ No results found for this basename: CHOL:2,HDL:2,LDLCALC:2,TRIG:2,CHOLHDL:2,LDLDIRECT:2 in the last 72 hours ------------------------------------------------------------------------------------------------------------------ No results found for this basename: TSH,T4TOTAL,FREET3,T3FREE,THYROIDAB in the last 72 hours ------------------------------------------------------------------------------------------------------------------ No results found for this basename: VITAMINB12:2,FOLATE:2,FERRITIN:2,TIBC:2,IRON:2,RETICCTPCT:2 in the last 72 hours  Coagulation profile  Lab 08/05/11 1109  INR 1.63*  PROTIME --    No results found for this basename: DDIMER:2 in  the last 72 hours  Cardiac Enzymes No results found for this basename: CK:3,CKMB:3,TROPONINI:3,MYOGLOBIN:3 in the last 168 hours ------------------------------------------------------------------------------------------------------------------ No components found with this basename: POCBNP:3  Micro Results Recent  Results (from the past 240 hour(s))  CULTURE, BLOOD (ROUTINE X 2)     Status: Normal (Preliminary result)   Collection Time   08/02/11  3:52 PM      Component Value Range Status Comment   Specimen Description BLOOD HEMODIALYSIS CATHETER   Final    Special Requests     Final    Value: BOTTLES DRAWN AEROBIC AND ANAEROBIC 10CC RIGHT IJ CATH   Culture  Setup Time [id number]   Final    Culture     Final    Value:        BLOOD CULTURE RECEIVED NO GROWTH TO DATE CULTURE WILL BE HELD FOR 5 DAYS BEFORE ISSUING A FINAL NEGATIVE REPORT   Report Status PENDING   Incomplete   CULTURE, BLOOD (ROUTINE X 2)     Status: Normal (Preliminary result)   Collection Time   08/02/11  5:40 PM      Component Value Range Status Comment   Specimen Description BLOOD HEMODIALYSIS CATHETER   Final    Special Requests BOTTLES DRAWN AEROBIC AND ANAEROBIC 10CC RIGHT IJ   Final    Culture  Setup Time [id number]   Final    Culture     Final    Value:        BLOOD CULTURE RECEIVED NO GROWTH TO DATE CULTURE WILL BE HELD FOR 5 DAYS BEFORE ISSUING A FINAL NEGATIVE REPORT   Report Status PENDING   Incomplete   URINE CULTURE     Status: Normal   Collection Time   08/04/11  4:11 PM      Component Value Range Status Comment   Specimen Description URINE, RANDOM   Final    Special Requests NONE   Final    Culture  Setup Time [id number]   Final    Colony Count >=100,000 COLONIES/ML   Final    Culture YEAST   Final    Report Status 08/06/2011 FINAL   Final   AFB CULTURE WITH SMEAR     Status: Normal (Preliminary result)   Collection Time   08/05/11  4:52 PM      Component Value Range Status Comment   Specimen Description TISSUE LYMPH NODE   Final    Special Requests NONE   Final    ACID FAST SMEAR NO ACID FAST BACILLI SEEN   Final    Culture     Final    Value: CULTURE WILL BE EXAMINED FOR 6 WEEKS BEFORE ISSUING A FINAL REPORT   Report Status PENDING   Incomplete   FUNGUS CULTURE W SMEAR     Status: Normal (Preliminary  result)   Collection Time   08/05/11  4:52 PM      Component Value Range Status Comment   Specimen Description TISSUE LYMPH NODE   Final    Special Requests NONE   Final    Fungal Smear NO YEAST OR FUNGAL ELEMENTS SEEN   Final    Culture CULTURE IN PROGRESS FOR FOUR WEEKS   Final    Report Status PENDING   Incomplete   TISSUE CULTURE     Status: Normal (Preliminary result)   Collection Time   08/05/11  4:52 PM      Component Value Range Status Comment   Specimen Description TISSUE LYMPH NODE  Final    Special Requests NONE   Final    Gram Stain     Final    Value: NO WBC SEEN     NO SQUAMOUS EPITHELIAL CELLS SEEN     NO ORGANISMS SEEN   Culture NO GROWTH 1 DAY   Final    Report Status PENDING   Incomplete     Radiology Reports Dg Chest 2 View  08/02/2011  *RADIOLOGY REPORT*  Clinical Data: History of difficulty breathing.  History of fever.  CHEST - 2 VIEW  Comparison: 07/22/2011.  08/01/2011.  Findings: Right sided dialysis catheter has tips in right atrial region.  There is stable cardiac silhouette enlargement.  There is upper lobe vascular prominence with pulmonary venous hypertension vascular congestion pattern.  No consolidation or definite pleural effusion is seen.  There is slight elevation of the margin of the left hemidiaphragm with minimal basilar atelectasis. On the lateral image there appears to be some posterior atelectasis and patchy infiltrative density without consolidation.  There is minimal degenerative spondylosis.  IMPRESSION: Dialysis catheter in place.  Stable cardiac silhouette enlargement. Pulmonary venous hypertension vascular congestion pattern.  No consolidation or pleural effusion. Left basilar and posterior atelectasis and patchy infiltrative densities without consolidation.  Original Report Authenticated By: Delane Ginger, M.D.   Dg Chest 2 View  07/22/2011  *RADIOLOGY REPORT*  Clinical Data: Fever for 1 week with cough, shortness of breath, hypertension and  diabetes  CHEST - 2 VIEW  Comparison: 10/19/2004  Findings: Heart and mediastinal contours are stable with an unchanged degree of mild cardiomegaly.  The lung fields are notable for mild pulmonary vascular congestion.  No overt congestive failure, interstitial septal lines or significant peribronchial cuffing is seen to suggest early interstitial edema. A slight increase in interstitial markings are seen diffusely and may indicate underlying viral pneumonitis.  No focal infiltrates are seen to suggest an area of focal bronchopneumonia.  No pleural fluid is seen.  Bony structures appear intact.  IMPRESSION: Mild pulmonary vascular congestion and mild increase in interstitial markings suspicious for viral pneumonitis given history of fever.  No findings suggestive of bronchopneumonia.  Original Report Authenticated By: Ander Gaster, M.D.   US Abdomen Limited  07/14/2011  *RADIOLOGY REPORT*  Clinical Data:  Elevated liver function tests, history of renal transplantation  LIMITED ABDOMINAL ULTRASOUND - RIGHT UPPER QUADRANT  Comparison:  CT abdomen of 08/26/2004  Findings:  Gallbladder:  The gallbladder is visualized and no gallstones are noted.  There is no pain over the gallbladder with compression.  Common bile duct:  The common bile duct is normal measuring 5.0 mm in diameter.  Liver:  The liver is echogenic consistent with fatty infiltration. Also the liver measures 20.1 cm sagittally indicating hepatomegaly. No ductal dilatation is seen.  IMPRESSION:  1.  Fatty infiltration of the liver.  Hepatomegaly. 2.  No gallstones.                   Original Report Authenticated By: Joretta Bachelor, M.D.   Dg Chest Port 1 View  08/01/2011  *RADIOLOGY REPORT*  Clinical Data: Central line placement.  PORTABLE CHEST - 1 VIEW  Comparison: 07/22/2011.  Findings: 1504 hours.  Right IJ dialysis catheter tips are present within the right atrium.  There is stable cardiomegaly with chronic vascular congestion.  No confluent  airspace opacity, significant pleural effusion or pneumothorax is seen.  IMPRESSION: Dialysis catheter placement without demonstrated pneumothorax. Stable cardiomegaly and vascular congestion.  Original Report  Authenticated By: Vivia Ewing, M.D.     Scheduled Meds:    . benzonatate  100 mg Oral TID  . calcium acetate  1,334 mg Oral TID WC  . cycloSPORINE modified  100 mg Oral BID  . darbepoetin (ARANESP) injection - DIALYSIS  150 mcg Intravenous Q Tue-HD  . insulin aspart  0-15 Units Subcutaneous TID WC  . insulin aspart  8 Units Subcutaneous TID WC  . insulin glargine  15 Units Subcutaneous BID  . isosorbide mononitrate  60 mg Oral Daily  . labetalol  100 mg Oral BID  . levothyroxine  75 mcg Oral QAC breakfast  . LORazepam  1 mg Intravenous Once  . LORazepam  1 mg Intravenous Once  . metoCLOPramide  5 mg Oral TID PC & HS  . multivitamin  1 tablet Oral Daily  . pantoprazole  40 mg Oral Q1200  . paricalcitol  2 mcg Intravenous Q T,Th,Sa-HD  . predniSONE  5 mg Oral Q breakfast  . rOPINIRole  1 mg Oral QHS  . sodium chloride  3 mL Intravenous Q12H   Continuous Infusions:  PRN Meds:.acetaminophen, acetaminophen, albuterol, ALPRAZolam, alum & mag hydroxide-simeth, guaiFENesin-dextromethorphan, heparin, heparin, heparin, heparin, HYDROcodone-acetaminophen, HYDROmorphone, menthol-cetylpyridinium, ondansetron (ZOFRAN) IV, ondansetron, promethazine  Assessment & Plan   46 year old man presented to short stay for iron infusion. Was found to have a hemoglobin of 6.7 and referred to the emergency department. There is also some concern of fever upon admission thought to be Viral URI at that time, however fever resolved immediately an initial infectious workup was unremarkable. He has Anemia (AOCD) and had PRBC transfusion initially,  he remained hospitalized because of worsening renal failure - when this failed to significantly improved the patient underwent AV graft placement and temporary  dialysis catheter placement and was started on hemodialysis April 8. He developed fever and April 8 for the first time since admission. Disposition is pending resolution of fever an outpatient establishment of hemodialysis.      Past medical history:   End-stage renal disease secondary to diabetic nephropathy, status post renal transplant 1995, refractory anemia (on Procrit and iron infusion as an outpatient), fibromyalgia.     Consultants:   Nephrology  Vascular surgery   Procedures:   March 30: 2-D echocardiogram: Left ventricular ejection fraction 55-60%. Normal wall motion.  March 29: Transfused 2 units packed blood cells.  April 4: Right arm and central venogram  April 5: Creation of right brachial artery to cephalic vein AV fistula  April 5: Transfusion one unit packed red blood cells.  April 8: Right IJ hemodialysis catheter. April 12 CT guided Node biopsy by IR      1. Fever: Resolved since admission until recurrence April 8 in the evening. Etiology unclear. On initial presentation it was presumed that his fever was secondary to viral pneumonitis. Influenza was negative. No fever recurred until April 8.  Appreciate Dr. Tommy Medal infectious disease input, CT A&P suggests ? Lymphoma, post CT guided biopsy,await pathology report from biopsy,  ID following, ABX per ID if needed.     2. Is living Acute/chronic renal failure: Hemodialysis initiated April 8. Status post AV fistula April 5. Renal following.     3. Hyperkalemia: Resolved. Secondary to acute renal failure. Not on potassium supplementation, heparin products or ACE-I/ARB.      4. Refractory anemia: Stable. Fecal occult blood negative. Status post total of 5 units packed red blood cells since admission. Currently H&H appears to be stable we'll continue to  monitor.     5. Diabetes mellitus: Stable. Adjusted Lantus to twice a day dose with good effect and ISS, closely monitor PM CBGs  CBG (last 3)    Basename 08/07/11 0744 08/07/11 0247 08/06/11 2226  GLUCAP 258* 218* 115*       6. History of renal transplantation with allograft nephropathy: Per nephrology. Continue cyclosporin and prednisone.      7. Obstructive sleep apnea: Patient refuses CPAP.On and off confused in am likely retains, have counseled to start wearing it, will order QHS CPAP. Which patient continues not to wear.      8. Mild delirium - waxing and waning, as in #8, patient refused CT brain, stable UA, CXR. + QHS CPAP encouraged again. Reduced narcotics and benzos 07-07-11.      DVT Prophylaxis   SCDs     [name] M.D on 08/07/2011 at 10:17 AM  [facility] Office  [phone]  endocrine Monday

## 2011-08-07 NOTE — Progress Notes (Signed)
[facility]  Subjective: Falling asleep during conversation. IV pain meds stopped by [name] MD.    Objective: Vital signs in last 24 hours: Blood pressure 126/69, pulse 86, temperature 98.2 F (36.8 C), temperature source Oral, resp. rate 17, height 5\' 8"  (1.727 m), weight 114 kg (251 lb 5.2 oz), SpO2 98.00%.    PHYSICAL EXAM General--sleepy Chest--cath R IJ, clear Heart--no rub Abd--nontender Extr--L BKA, R upper arm AVF, less edema R leg  Lab Results:   Lab 08/06/11 0600 08/04/11 1020 08/03/11 0510 08/01/11 1846  NA 129* 131* 132* --  K 4.3 3.5 3.9 --  CL 93* 94* 94* --  CO2 22 23 25  --  BUN 68* 84* 66* --  CREATININE 4.81* 5.40* 3.90* --  ALB -- -- -- --  GLUCOSE 250* -- -- --  CALCIUM 8.7 8.7 8.7 --  PHOS 6.0* 8.0* -- 8.5*     Basename 08/06/11 0600  WBC 9.5  HGB 8.9*  HCT 29.1*  PLT 309      Assessment/Plan: 1. Failed renal transplant, new start to HD-  continue anti-rejection meds (cyclosporine & prednisone), slow taper in outpt setting.  Continue HD T-Th-Sat. Has new R IJ TDC and AVF in right arm. Needs CLIP for outpt HD unit setup but need to know disposition first. Talked with primary MD- they are awaiting LN Bx results, and the plan at this time is for SNF placement short-term for rehab. Will notify staff to start CLIP process tomorrow, but until a specific facility has been decided upon, we won't be able to finalize the process.  2. HTN/volume- labetalol reduced to 100 mg bid. BP 150/78.  3. DM-continue insulin, CBG  4. Secondary PTH-- PTH 172.4, phosphorus 8.5. Increase phosLo to 2 TID. Was on zemplar 1 mcg/d PTA. Will begin 2 mcg each HD  5. Anemia-- Hb 8's,  FeTIBC 11 %, but ferritin 2190. Begin aranesp 150/wk. Looks like he was transfused sometime recently, but not sure.  6. Fever/LAN- CT scan showed new retroperitoneal and mesenteric lymphadenopathy. LN Bx done yest by IR.     LOS: 16 days   Alijah Akram D 08/07/2011,3:10 PM

## 2011-08-08 ENCOUNTER — Telehealth (HOSPITAL_COMMUNITY): Payer: Self-pay | Admitting: Radiology

## 2011-08-08 LAB — CMV ANTIBODY, IGG (EIA): CMV Ab - IgG: 0 (ref <0.90)

## 2011-08-08 LAB — GLUCOSE, CAPILLARY
Glucose-Capillary: 283 mg/dL — ABNORMAL HIGH (ref 70–99)
Glucose-Capillary: 129 mg/dL — ABNORMAL HIGH (ref 70–99)
Glucose-Capillary: 96 mg/dL (ref 70–99)
Glucose-Capillary: 185 mg/dL — ABNORMAL HIGH (ref 70–99)

## 2011-08-08 LAB — HEPATITIS PANEL, ACUTE
HCV Ab: NEGATIVE
Hep A IgM: NEGATIVE
Hep B C IgM: NEGATIVE
Hepatitis B Surface Ag: NEGATIVE

## 2011-08-08 LAB — CMV IGM: CMV IgM: 0.5 (ref <0.90)

## 2011-08-08 NOTE — Progress Notes (Signed)
Patient ID: Dale [name], male   DOB: [birth year], 46 y.o.   MRN: [id number] S:no complaints O:BP 117/63  Pulse 82  Temp(Src) 97.9 F (36.6 C) (Oral)  Resp 20  Ht 5\' 8"  (1.727 m)  Wt 114.8 kg (253 lb 1.4 oz)  BMI 38.48 kg/m2  SpO2 99%  Intake/Output Summary (Last 24 hours) at 08/08/11 1446 Last data filed at 08/08/11 1240  Gross per 24 hour  Intake    480 ml  Output    302 ml  Net    178 ml   Weight change: -1.9 kg (-4 lb 3 oz) Gen:WD WN obese WM in NAD CVS:no rub Resp:CTA [id number] Ext:+edema   Lab 08/06/11 0600 08/04/11 1020 08/03/11 0510 08/02/11 1531 08/01/11 1846  NA 129* 131* 132* 133* 133*  K 4.3 3.5 3.9 3.8 4.8  CL 93* 94* 94* 97 96  CO2 22 23 25 23 20   GLUCOSE 250* 51* 333* 263* 173*  BUN 68* 84* 66* 107* 137*  CREATININE 4.81* 5.40* 3.90* 4.64* 4.59*  ALB -- -- -- -- --  CALCIUM 8.7 8.7 8.7 8.8 9.2  PHOS 6.0* 8.0* -- -- 8.5*  AST -- -- -- -- --  ALT -- -- -- -- --   Liver Function Tests:  Lab 08/06/11 0600 08/04/11 1020  AST -- --  ALT -- --  ALKPHOS -- --  BILITOT -- --  PROT -- --  ALBUMIN 1.3* 1.2*   No results found for this basename: LIPASE:3,AMYLASE:3 in the last 168 hours No results found for this basename: AMMONIA:3 in the last 168 hours CBC:  Lab 08/06/11 0600 08/04/11 1020 08/03/11 0510 08/02/11 1531  WBC 9.5 11.9* 10.7* --  NEUTROABS -- -- -- --  HGB 8.9* 7.0* 7.3* --  HCT 29.1* 23.3* 24.8* --  MCV 86.9 87.6 90.2 88.5  PLT 309 349 351 --   Cardiac Enzymes: No results found for this basename: CKTOTAL:5,CKMB:5,CKMBINDEX:5,TROPONINI:5 in the last 168 hours CBG:  Lab 08/08/11 1201 08/08/11 0734 08/07/11 2126 08/07/11 1634 08/07/11 1142  GLUCAP 129* 283* 154* 127* 155*    Iron Studies: No results found for this basename: IRON,TIBC,TRANSFERRIN,FERRITIN in the last 72 hours Studies/Results: No results found.    . benzonatate  100 mg Oral TID  . calcium acetate  1,334 mg Oral TID WC  . cycloSPORINE modified  100 mg Oral BID  .  darbepoetin (ARANESP) injection - DIALYSIS  150 mcg Intravenous Q Tue-HD  . diphenhydrAMINE  25 mg Oral QHS,MR X 1  . insulin aspart  0-15 Units Subcutaneous TID WC  . insulin aspart  8 Units Subcutaneous TID WC  . insulin glargine  15 Units Subcutaneous BID  . isosorbide mononitrate  60 mg Oral Daily  . labetalol  100 mg Oral BID  . levothyroxine  75 mcg Oral QAC breakfast  . metoCLOPramide  5 mg Oral TID PC & HS  . multivitamin  1 tablet Oral Daily  . pantoprazole  40 mg Oral Q1200  . paricalcitol  2 mcg Intravenous Q T,Th,Sa-HD  . predniSONE  5 mg Oral Q breakfast  . rOPINIRole  1 mg Oral QHS  . sodium chloride  3 mL Intravenous Q12H    BMET    Component Value Date/Time   NA 129* 08/06/2011 0600   K 4.3 08/06/2011 0600   CL 93* 08/06/2011 0600   CO2 22 08/06/2011 0600   GLUCOSE 250* 08/06/2011 0600   BUN 68* 08/06/2011 0600   CREATININE 4.81* 08/06/2011 0600  CALCIUM 8.7 08/06/2011 0600   GFRNONAA 13* 08/06/2011 0600   GFRAA 15* 08/06/2011 0600   CBC    Component Value Date/Time   WBC 9.5 08/06/2011 0600   RBC 3.35* 08/06/2011 0600   HGB 8.9* 08/06/2011 0600   HCT 29.1* 08/06/2011 0600   PLT 309 08/06/2011 0600   MCV 86.9 08/06/2011 0600   MCH 26.6 08/06/2011 0600   MCHC 30.6 08/06/2011 0600   RDW 18.8* 08/06/2011 0600   LYMPHSABS 0.8 07/22/2011 1457   MONOABS 1.0 07/22/2011 1457   EOSABS 0.1 07/22/2011 1457   BASOSABS 0.0 07/22/2011 1457     Assessment/Plan:  1. Failed renal transplant, new start to HD- continue anti-rejection meds (cyclosporine & prednisone), slow taper in outpt setting. Continue HD T-Th-Sat.  Needs CLIP for outpt HD unit setup but need to know disposition first. Will notify staff to start CLIP process tomorrow, but until a specific facility has been decided upon, we won't be able to finalize the process.  2. Vascular Access-Has new R IJ TDC and AVF in right arm. 3. HTN/volume- labetalol reduced to 100 mg bid. BP 150/78.  4. DM-continue insulin, CBG   5. Secondary PTH-- phos improving, on vit D 6. Anemia-- Hb 8's, FeTIBC 11 %, but ferritin 2190. Begin aranesp 150/wk. Looks like he was transfused sometime recently, but not sure.  7. Fever/LAN- CT scan showed new retroperitoneal and mesenteric lymphadenopathy. LN Bx done yest by IR.  8. Dispo- SNF placement short-term for rehab.  [place] A

## 2011-08-08 NOTE — Progress Notes (Addendum)
Dale [name] [id number] is a 46 y.o. male,  Outpatient Primary MD for the patient is No primary provider on file.  Chief Complaint  Patient presents with  . Weakness  . Shortness of Breath        Subjective:    Dale [name] today has, No headache, No chest pain, No abdominal pain - No Nausea, No new weakness tingling or numbness, now no Cough - No SOB.    Objective:   Filed Vitals:   08/07/11 1715 08/07/11 2130 08/08/11 0612 08/08/11 1025  BP: 128/75 148/68 132/63 117/63  Pulse: 87 85 82 82  Temp: 98.1 F (36.7 C) 97.8 F (36.6 C) 97.6 F (36.4 C) 97.9 F (36.6 C)  TempSrc: Oral Oral Oral Oral  Resp: 16 20 19 20   Height:  5\' 8"  (1.727 m)    Weight:  114.8 kg (253 lb 1.4 oz)    SpO2: 97% 99% 100% 99%    Wt Readings from Last 3 Encounters:  08/07/11 114.8 kg (253 lb 1.4 oz)  08/07/11 114.8 kg (253 lb 1.4 oz)  08/07/11 114.8 kg (253 lb 1.4 oz)     Intake/Output Summary (Last 24 hours) at 08/08/11 1339 Last data filed at 08/08/11 1240  Gross per 24 hour  Intake    720 ml  Output    302 ml  Net    418 ml    Exam Awake alert No new F.N deficits, Normal affect [place].AT,PERRAL Supple Neck,No JVD, No cervical lymphadenopathy appriciated.  Symmetrical Chest wall movement, Good air movement bilaterally, CTAB RRR,No Gallops,Rubs or new Murmurs, No Parasternal Heave +ve B.Sounds, Abd Soft, Non tender, No organomegaly appriciated, No rebound -guarding or rigidity. 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   Data Review  CBC  Lab 08/06/11 0600 08/04/11 1020 08/03/11 0510 08/02/11 1531  WBC 9.5 11.9* 10.7* 12.4*  HGB 8.9* 7.0* 7.3* 7.4*  HCT 29.1* 23.3* 24.8* 24.7*  PLT 309 349 351 429*  MCV 86.9 87.6 90.2 88.5  MCH 26.6 26.3 26.5 26.5  MCHC 30.6 30.0 29.4* 30.0  RDW 18.8* 19.1* 19.4* 19.5*  LYMPHSABS -- -- -- --  MONOABS -- -- -- --  EOSABS -- -- -- --  BASOSABS -- -- -- --  BANDABS -- -- -- --    Chemistries   Lab 08/06/11 0600 08/04/11 1020 08/03/11 0510  08/02/11 1531 08/01/11 1846  NA 129* 131* 132* 133* 133*  K 4.3 3.5 3.9 3.8 4.8  CL 93* 94* 94* 97 96  CO2 22 23 25 23 20   GLUCOSE 250* 51* 333* 263* 173*  BUN 68* 84* 66* 107* 137*  CREATININE 4.81* 5.40* 3.90* 4.64* 4.59*  CALCIUM 8.7 8.7 8.7 8.8 9.2  MG -- -- -- -- --  AST -- -- -- -- --  ALT -- -- -- -- --  ALKPHOS -- -- -- -- --  BILITOT -- -- -- -- --   ------------------------------------------------------------------------------------------------------------------ estimated creatinine clearance is 23.9 ml/min (by C-G formula based on Cr of 4.81). ------------------------------------------------------------------------------------------------------------------ No results found for this basename: HGBA1C:2 in the last 72 hours ------------------------------------------------------------------------------------------------------------------ No results found for this basename: CHOL:2,HDL:2,LDLCALC:2,TRIG:2,CHOLHDL:2,LDLDIRECT:2 in the last 72 hours ------------------------------------------------------------------------------------------------------------------ No results found for this basename: TSH,T4TOTAL,FREET3,T3FREE,THYROIDAB in the last 72 hours ------------------------------------------------------------------------------------------------------------------ No results found for this basename: VITAMINB12:2,FOLATE:2,FERRITIN:2,TIBC:2,IRON:2,RETICCTPCT:2 in the last 72 hours  Coagulation profile  Lab 08/05/11 1109  INR 1.63*  PROTIME --    No results found for this basename: DDIMER:2 in the last 72 hours  Cardiac Enzymes No results found  for this basename: CK:3,CKMB:3,TROPONINI:3,MYOGLOBIN:3 in the last 168 hours ------------------------------------------------------------------------------------------------------------------ No components found with this basename: POCBNP:3  Micro Results Recent Results (from the past 240 hour(s))  CULTURE, BLOOD (ROUTINE X 2)      Status: Normal (Preliminary result)   Collection Time   08/02/11  3:52 PM      Component Value Range Status Comment   Specimen Description BLOOD HEMODIALYSIS CATHETER   Final    Special Requests     Final    Value: BOTTLES DRAWN AEROBIC AND ANAEROBIC 10CC RIGHT IJ CATH   Culture  Setup Time [id number]   Final    Culture     Final    Value:        BLOOD CULTURE RECEIVED NO GROWTH TO DATE CULTURE WILL BE HELD FOR 5 DAYS BEFORE ISSUING A FINAL NEGATIVE REPORT   Report Status PENDING   Incomplete   CULTURE, BLOOD (ROUTINE X 2)     Status: Normal (Preliminary result)   Collection Time   08/02/11  5:40 PM      Component Value Range Status Comment   Specimen Description BLOOD HEMODIALYSIS CATHETER   Final    Special Requests BOTTLES DRAWN AEROBIC AND ANAEROBIC 10CC RIGHT IJ   Final    Culture  Setup Time [id number]   Final    Culture     Final    Value:        BLOOD CULTURE RECEIVED NO GROWTH TO DATE CULTURE WILL BE HELD FOR 5 DAYS BEFORE ISSUING A FINAL NEGATIVE REPORT   Report Status PENDING   Incomplete   URINE CULTURE     Status: Normal   Collection Time   08/04/11  4:11 PM      Component Value Range Status Comment   Specimen Description URINE, RANDOM   Final    Special Requests NONE   Final    Culture  Setup Time [id number]   Final    Colony Count >=100,000 COLONIES/ML   Final    Culture YEAST   Final    Report Status 08/06/2011 FINAL   Final   AFB CULTURE WITH SMEAR     Status: Normal (Preliminary result)   Collection Time   08/05/11  4:52 PM      Component Value Range Status Comment   Specimen Description TISSUE LYMPH NODE   Final    Special Requests NONE   Final    ACID FAST SMEAR NO ACID FAST BACILLI SEEN   Final    Culture     Final    Value: CULTURE WILL BE EXAMINED FOR 6 WEEKS BEFORE ISSUING A FINAL REPORT   Report Status PENDING   Incomplete   FUNGUS CULTURE W SMEAR     Status: Normal (Preliminary result)   Collection Time   08/05/11  4:52 PM      Component Value  Range Status Comment   Specimen Description TISSUE LYMPH NODE   Final    Special Requests NONE   Final    Fungal Smear NO YEAST OR FUNGAL ELEMENTS SEEN   Final    Culture CULTURE IN PROGRESS FOR FOUR WEEKS   Final    Report Status PENDING   Incomplete   TISSUE CULTURE     Status: Normal (Preliminary result)   Collection Time   08/05/11  4:52 PM      Component Value Range Status Comment   Specimen Description TISSUE LYMPH NODE   Final    Special Requests NONE  Final    Gram Stain     Final    Value: NO WBC SEEN     NO SQUAMOUS EPITHELIAL CELLS SEEN     NO ORGANISMS SEEN   Culture NO GROWTH 2 DAYS   Final    Report Status PENDING   Incomplete     Radiology Reports Dg Chest 2 View  08/02/2011  *RADIOLOGY REPORT*  Clinical Data: History of difficulty breathing.  History of fever.  CHEST - 2 VIEW  Comparison: 07/22/2011.  08/01/2011.  Findings: Right sided dialysis catheter has tips in right atrial region.  There is stable cardiac silhouette enlargement.  There is upper lobe vascular prominence with pulmonary venous hypertension vascular congestion pattern.  No consolidation or definite pleural effusion is seen.  There is slight elevation of the margin of the left hemidiaphragm with minimal basilar atelectasis. On the lateral image there appears to be some posterior atelectasis and patchy infiltrative density without consolidation.  There is minimal degenerative spondylosis.  IMPRESSION: Dialysis catheter in place.  Stable cardiac silhouette enlargement. Pulmonary venous hypertension vascular congestion pattern.  No consolidation or pleural effusion. Left basilar and posterior atelectasis and patchy infiltrative densities without consolidation.  Original Report Authenticated By: Delane Ginger, M.D.   Dg Chest 2 View  07/22/2011  *RADIOLOGY REPORT*  Clinical Data: Fever for 1 week with cough, shortness of breath, hypertension and diabetes  CHEST - 2 VIEW  Comparison: 10/19/2004  Findings: Heart and  mediastinal contours are stable with an unchanged degree of mild cardiomegaly.  The lung fields are notable for mild pulmonary vascular congestion.  No overt congestive failure, interstitial septal lines or significant peribronchial cuffing is seen to suggest early interstitial edema. A slight increase in interstitial markings are seen diffusely and may indicate underlying viral pneumonitis.  No focal infiltrates are seen to suggest an area of focal bronchopneumonia.  No pleural fluid is seen.  Bony structures appear intact.  IMPRESSION: Mild pulmonary vascular congestion and mild increase in interstitial markings suspicious for viral pneumonitis given history of fever.  No findings suggestive of bronchopneumonia.  Original Report Authenticated By: Ander Gaster, M.D.   US Abdomen Limited  07/14/2011  *RADIOLOGY REPORT*  Clinical Data:  Elevated liver function tests, history of renal transplantation  LIMITED ABDOMINAL ULTRASOUND - RIGHT UPPER QUADRANT  Comparison:  CT abdomen of 08/26/2004  Findings:  Gallbladder:  The gallbladder is visualized and no gallstones are noted.  There is no pain over the gallbladder with compression.  Common bile duct:  The common bile duct is normal measuring 5.0 mm in diameter.  Liver:  The liver is echogenic consistent with fatty infiltration. Also the liver measures 20.1 cm sagittally indicating hepatomegaly. No ductal dilatation is seen.  IMPRESSION:  1.  Fatty infiltration of the liver.  Hepatomegaly. 2.  No gallstones.                   Original Report Authenticated By: Joretta Bachelor, M.D.   Dg Chest Port 1 View  08/01/2011  *RADIOLOGY REPORT*  Clinical Data: Central line placement.  PORTABLE CHEST - 1 VIEW  Comparison: 07/22/2011.  Findings: 1504 hours.  Right IJ dialysis catheter tips are present within the right atrium.  There is stable cardiomegaly with chronic vascular congestion.  No confluent airspace opacity, significant pleural effusion or pneumothorax is seen.   IMPRESSION: Dialysis catheter placement without demonstrated pneumothorax. Stable cardiomegaly and vascular congestion.  Original Report Authenticated By: Vivia Ewing, M.D.  Scheduled Meds:    . benzonatate  100 mg Oral TID  . calcium acetate  1,334 mg Oral TID WC  . cycloSPORINE modified  100 mg Oral BID  . darbepoetin (ARANESP) injection - DIALYSIS  150 mcg Intravenous Q Tue-HD  . diphenhydrAMINE  25 mg Oral QHS,MR X 1  . insulin aspart  0-15 Units Subcutaneous TID WC  . insulin aspart  8 Units Subcutaneous TID WC  . insulin glargine  15 Units Subcutaneous BID  . isosorbide mononitrate  60 mg Oral Daily  . labetalol  100 mg Oral BID  . levothyroxine  75 mcg Oral QAC breakfast  . metoCLOPramide  5 mg Oral TID PC & HS  . multivitamin  1 tablet Oral Daily  . pantoprazole  40 mg Oral Q1200  . paricalcitol  2 mcg Intravenous Q T,Th,Sa-HD  . predniSONE  5 mg Oral Q breakfast  . rOPINIRole  1 mg Oral QHS  . sodium chloride  3 mL Intravenous Q12H   Continuous Infusions:  PRN Meds:.acetaminophen, acetaminophen, albuterol, alum & mag hydroxide-simeth, heparin, heparin, heparin, heparin, HYDROcodone-acetaminophen, menthol-cetylpyridinium, ondansetron (ZOFRAN) IV, ondansetron  Assessment & Plan   46 year old man presented to short stay for iron infusion. Was found to have a hemoglobin of 6.7 and referred to the emergency department. There is also some concern of fever upon admission thought to be Viral URI at that time, however fever resolved immediately an initial infectious workup was unremarkable. He has Anemia (AOCD) and had PRBC transfusion initially,  he remained hospitalized because of worsening renal failure - when this failed to significantly improved the patient underwent AV graft placement and temporary dialysis catheter placement and was started on hemodialysis April 8. He developed fever and April 8 for the first time since admission. ID was consulted and after CT scan of  abdomen and pelvis it was found that patient had diffuse lymphadenopathy suspicious for lymphoma. He should was offered excision biopsy versus CT-guided biopsy by interventional radiology, patient at that time opted not to have excisional biopsy and first give a trial of CT-guided needle biopsy, he was clearly told that this was not the best method of obtaining samples however he at that time was insistent that CT-guided biopsy be given a shot first, however now his delirium has improved and he is agreeable to excision biopsy if needed in the future. I have talked to pathologist on-call on 08/08/2011 he is reviewing the samples with special staining and has sent samples for flow cytometry also. Final results should be back on 08/09/2011. Thereafter patient might need hematology oncology input versus excisional biopsy if present samples prove inadequate.     Past medical history:   End-stage renal disease secondary to diabetic nephropathy, status post renal transplant 1995, refractory anemia (on Procrit and iron infusion as an outpatient), fibromyalgia.     Consultants:   Nephrology  Vascular surgery ID IR   Procedures:   March 30: 2-D echocardiogram: Left ventricular ejection fraction 55-60%. Normal wall motion.  March 29: Transfused 2 units packed blood cells.  April 4: Right arm and central venogram  April 5: Creation of right brachial artery to cephalic vein AV fistula  April 5: Transfusion one unit packed red blood cells.  April 8: Right IJ hemodialysis catheter. April 12 CT guided Node biopsy by IR      1. Fever: Resolved since admission until recurrence April 8 in the evening. Etiology unclear. On initial presentation it was presumed that his fever was secondary to  viral pneumonitis. Influenza was negative. No fever recurred until April 8.  Appreciate Dr. Tommy Medal infectious disease input, CT A&P suggests ? Lymphoma, post CT guided biopsy,await pathology report from biopsy,  ID  following, ABX per ID if needed.    2.  Acute/chronic renal failure: Hemodialysis initiated April 8. Status post AV fistula April 5. Renal following.     3. Hyperkalemia: Resolved. Secondary to acute renal failure. Not on potassium supplementation, heparin products or ACE-I/ARB.      4. Refractory anemia: Stable. Fecal occult blood negative. Status post total of 5 units packed red blood cells since admission. Currently H&H appears to be stable we'll continue to monitor.     5. Diabetes mellitus: Stable. Adjusted Lantus to twice a day dose with good effect and ISS, closely monitor PM CBGs  CBG (last 3)   Basename 08/08/11 1201 08/08/11 0734 08/07/11 2126  GLUCAP 129* 283* 154*       6. History of renal transplantation with allograft nephropathy (failed): Per nephrology. Continue cyclosporin and prednisone needs outpt taper. Now T,TH,Sat dialysis schedule-[facility].      7. Obstructive sleep apnea: Patient refuses CPAP.On and off confused in am likely retains, have counseled to start wearing it, will order QHS CPAP. Which patient continues not to wear.      8. Mild delirium - waxing and waning, as in #8, patient refused CT brain, stable UA, CXR. + QHS CPAP encouraged again. Reduced narcotics and benzos 07-07-11. Delirium appears to be improving post medication adjustment.      DVT Prophylaxis   SCDs     [name] M.D on 08/08/2011 at 1:39 PM  [place]  [phone]  endocrine Monday

## 2011-08-08 NOTE — Progress Notes (Signed)
   CARE MANAGEMENT NOTE 08/08/2011  Patient:  Dale [name], Dale [name]   Account Number:  [id number]  Date Initiated:  07/25/2011  Documentation initiated by:  [name]  Subjective/Objective Assessment:   PT WAS ADMITTED WITH ANEMIA     Action/Plan:   PROGRESSION OF CARE AND DISCHARGE PLANNING   Anticipated DC Date:  08/11/2011   Anticipated DC Plan:  SKILLED NURSING FACILITY  In-house referral  Clinical Social Worker      DC Planning Services  CM consult      Choice offered to / List presented to:             Status of service:  In process, will continue to follow Medicare Important Message given?   (If response is "NO", the following Medicare IM given date fields will be blank) Date Medicare IM given:   Date Additional Medicare IM given:    Discharge Disposition:    Per UR Regulation:  Reviewed for med. necessity/level of care/duration of stay  If discussed at [place] of Stay Meetings, dates discussed:   08/03/2011    Comments:  08/08/11 [name], RN, BSN [place], PT IS OK WITH SNF.  HE WOULD LIKE TO BE FAXED OUT FOR [facility].  INFORMED CSW.  WILL F/U.  08/05/11 [name], RN, BSN 1610 UR COMPLETED.  PT IS TO HAVE A NEEDLE BX TODAY.  PT HAS BEEN DENIED BY [facility] AND [facility] HAS NO HD BEDS AT THIS TIME.  WILL F/U WITH [facility] ON MONDAY.  08/03/11 Cecylia Brazill, RN, BSN 1655 PT'S WIFE HAS STATED THAT SHE CANT TAKE CARE OF HIM AT DC IF HE CONTS TO BE AS WEAK.  PT/OT TO EVAL, CIR WILL LOOK AT HIM IF IT IS RECOMMENDED.  UR COMPLETED 08/02/11 [name], RN, BNS [phone]  UR COMPLETED 07/29/11 [name], RN, BSN [place] 07/25/11 [name], RN, BSN [phone]

## 2011-08-09 ENCOUNTER — Inpatient Hospital Stay (HOSPITAL_COMMUNITY): Payer: Medicare Other

## 2011-08-09 ENCOUNTER — Encounter (HOSPITAL_COMMUNITY): Payer: Self-pay | Admitting: General Surgery

## 2011-08-09 DIAGNOSIS — L98499 Non-pressure chronic ulcer of skin of other sites with unspecified severity: Secondary | ICD-10-CM

## 2011-08-09 DIAGNOSIS — N179 Acute kidney failure, unspecified: Secondary | ICD-10-CM

## 2011-08-09 DIAGNOSIS — R599 Enlarged lymph nodes, unspecified: Secondary | ICD-10-CM

## 2011-08-09 DIAGNOSIS — R5381 Other malaise: Secondary | ICD-10-CM

## 2011-08-09 DIAGNOSIS — I739 Peripheral vascular disease, unspecified: Secondary | ICD-10-CM

## 2011-08-09 DIAGNOSIS — S88119A Complete traumatic amputation at level between knee and ankle, unspecified lower leg, initial encounter: Secondary | ICD-10-CM

## 2011-08-09 LAB — CULTURE, BLOOD (ROUTINE X 2)
Culture  Setup Time: 201304100046
Culture  Setup Time: 201304100046
Culture: NO GROWTH
Culture: NO GROWTH

## 2011-08-09 LAB — TISSUE CULTURE
Culture: NO GROWTH
Gram Stain: NONE SEEN

## 2011-08-09 LAB — GLUCOSE, CAPILLARY
Glucose-Capillary: 280 mg/dL — ABNORMAL HIGH (ref 70–99)
Glucose-Capillary: 186 mg/dL — ABNORMAL HIGH (ref 70–99)
Glucose-Capillary: 108 mg/dL — ABNORMAL HIGH (ref 70–99)
Glucose-Capillary: 39 mg/dL — CL (ref 70–99)
Glucose-Capillary: 42 mg/dL — CL (ref 70–99)
Glucose-Capillary: 147 mg/dL — ABNORMAL HIGH (ref 70–99)
Glucose-Capillary: 65 mg/dL — ABNORMAL LOW (ref 70–99)
Glucose-Capillary: 105 mg/dL — ABNORMAL HIGH (ref 70–99)

## 2011-08-09 LAB — RENAL FUNCTION PANEL
Albumin: 1.4 g/dL — ABNORMAL LOW (ref 3.5–5.2)
BUN: 66 mg/dL — ABNORMAL HIGH (ref 6–23)
CO2: 23 mEq/L (ref 19–32)
Calcium: 8.7 mg/dL (ref 8.4–10.5)
Chloride: 91 mEq/L — ABNORMAL LOW (ref 96–112)
Creatinine, Ser: 3.63 mg/dL — ABNORMAL HIGH (ref 0.50–1.35)
GFR calc Af Amer: 22 mL/min — ABNORMAL LOW (ref >90)
GFR calc non Af Amer: 19 mL/min — ABNORMAL LOW (ref >90)
Glucose, Bld: 289 mg/dL — ABNORMAL HIGH (ref 70–99)
Phosphorus: 4.9 mg/dL — ABNORMAL HIGH (ref 2.3–4.6)
Potassium: 4.2 mEq/L (ref 3.5–5.1)
Sodium: 128 mEq/L — ABNORMAL LOW (ref 135–145)

## 2011-08-09 LAB — CBC
HCT: 29.4 % — ABNORMAL LOW (ref 39.0–52.0)
Hemoglobin: 8.9 g/dL — ABNORMAL LOW (ref 13.0–17.0)
MCH: 26.3 pg (ref 26.0–34.0)
MCHC: 30.3 g/dL (ref 30.0–36.0)
MCV: 87 fL (ref 78.0–100.0)
Platelets: 381 10*3/uL (ref 150–400)
RBC: 3.38 MIL/uL — ABNORMAL LOW (ref 4.22–5.81)
RDW: 18 % — ABNORMAL HIGH (ref 11.5–15.5)
WBC: 8 10*3/uL (ref 4.0–10.5)

## 2011-08-09 MED ORDER — VANCOMYCIN HCL IN DEXTROSE 1-5 GM/200ML-% IV SOLN
1000.0000 mg | Freq: Once | INTRAVENOUS | Status: AC
  Administered 2011-08-10: 1000 mg via INTRAVENOUS
  Filled 2011-08-09: qty 200

## 2011-08-09 MED ORDER — CYCLOBENZAPRINE HCL 5 MG PO TABS
5.0000 mg | ORAL_TABLET | Freq: Once | ORAL | Status: AC
  Administered 2011-08-09: 5 mg via ORAL
  Filled 2011-08-09: qty 1

## 2011-08-09 MED ORDER — GLUCOSE-VITAMIN C 4-6 GM-MG PO CHEW
CHEWABLE_TABLET | ORAL | Status: AC
  Filled 2011-08-09: qty 1

## 2011-08-09 MED ORDER — HYDROMORPHONE HCL 2 MG PO TABS
ORAL_TABLET | ORAL | Status: AC
  Administered 2011-08-09: 2 mg via ORAL
  Filled 2011-08-09: qty 1

## 2011-08-09 MED ORDER — GLUCOSE 40 % PO GEL
ORAL | Status: AC
  Filled 2011-08-09: qty 1

## 2011-08-09 MED ORDER — PARICALCITOL 5 MCG/ML IV SOLN
INTRAVENOUS | Status: AC
  Administered 2011-08-09: 2 ug via INTRAVENOUS
  Filled 2011-08-09: qty 1

## 2011-08-09 MED ORDER — DARBEPOETIN ALFA-POLYSORBATE 150 MCG/0.3ML IJ SOLN
INTRAMUSCULAR | Status: AC
  Administered 2011-08-09: 150 ug via INTRAVENOUS
  Filled 2011-08-09: qty 0.3

## 2011-08-09 MED ORDER — ONDANSETRON HCL 4 MG/2ML IJ SOLN
INTRAMUSCULAR | Status: AC
  Administered 2011-08-09: 4 mg via INTRAVENOUS
  Filled 2011-08-09: qty 2

## 2011-08-09 MED ORDER — HYDROCODONE-ACETAMINOPHEN 5-325 MG PO TABS
1.0000 | ORAL_TABLET | Freq: Once | ORAL | Status: AC
  Administered 2011-08-09: 1 via ORAL
  Filled 2011-08-09: qty 1

## 2011-08-09 MED ORDER — DEXTROSE 50 % IV SOLN
INTRAVENOUS | Status: AC
  Administered 2011-08-09: 25 mL
  Administered 2011-08-09: 25 mL via INTRAVENOUS
  Filled 2011-08-09: qty 50

## 2011-08-09 MED ORDER — HYDROCODONE-ACETAMINOPHEN 5-325 MG PO TABS
ORAL_TABLET | ORAL | Status: AC
  Administered 2011-08-09: 1 via ORAL
  Filled 2011-08-09: qty 1

## 2011-08-09 MED ORDER — HYDROMORPHONE HCL 2 MG PO TABS
2.0000 mg | ORAL_TABLET | Freq: Four times a day (QID) | ORAL | Status: DC | PRN
  Administered 2011-08-09 – 2011-08-12 (×12): 2 mg via ORAL
  Filled 2011-08-09 (×10): qty 1

## 2011-08-09 NOTE — Progress Notes (Signed)
Follow up visit.  Checked with pt to ensure his request for Advanced Directive assistance had been taken care of.  He is in the process of updating it.  Provided emotional support.   Pt was thankful for the assistance.  Please page if further assistance is needed.  Gaelle Adriance  [phone] oncall pager   [phone] personal pager

## 2011-08-09 NOTE — Consult Note (Signed)
Physical Medicine and Rehabilitation Consult Reason for Consult: deconditioning Referring Phsyician:  Dr. [name]     HPI: Dale [name] is an 46 y.o. male with history of, L-BKA, DM with nephropathy with renal transplant. Admitted 03/29 with increasing dyspnea and generalized weakness for past month.  Patient with recent admission to OSH for CHF and creatinine at discharge @4 .2.  Patient with low grade fevers and nonproductive cough treated with IV antibiotics.  Anemia treated with transfusion.  Patient without improvement in acute on chronic renal failure requiring AVG placement on 04/05 by Dr. Kellie Simmering.  Did develop uremia with delirium.  HD initiated with plans to continue on outpatient basis due to failed transplant.  Patient deconditioned.  MD/wife recommending rehab prior to discharge to home.   Review of Systems  Respiratory: Negative for cough and shortness of breath.   Cardiovascular: Negative for chest pain and palpitations.  Gastrointestinal: Negative for heartburn and nausea.  Musculoskeletal: Positive for back pain and joint pain (right knee pain.).  Neurological: Positive for weakness. Negative for headaches.  All other systems reviewed and are negative.   Past Medical History  Diagnosis Date  . Renal disease   . Diabetes mellitus    Past Surgical History  Procedure Date  . Appendectomy   . Hernia repair   . Kidney transplant   . Cataract extraction   . Arteriovenous graft placement   . Av fistula placement   . Toe amputation   . Below knee leg amputation   . Carpal tunnel release   . Shoulder surgery   . Av fistula placement 07/29/2011    Procedure: ARTERIOVENOUS (AV) FISTULA CREATION;  Surgeon: Mal Misty, MD;  Location: [place];  Service: Vascular;  Laterality: Right;  Brachial cephalic  . Insertion of dialysis catheter 08/01/2011    Procedure: INSERTION OF DIALYSIS CATHETER;  Surgeon: Rosetta Posner, MD;  Location: [facility] OR;  Service: Vascular;  Laterality: Right;  insertion  of dialysis catheter on right internal jugular vein   No family history on file.  Social History: Married.  Independent at wheelchair level PTA-use wheelchair at home due to weakness (walked occasionally) Has been declining since last October.   He reports that he has never smoked. He does not have any smokeless tobacco history on file. He reports that he drinks alcohol. He reports that he does not use illicit drugs. Wife works days.  Father can check in past discharge?   Allergies  Allergen Reactions  . Amoxicillin   . Ciprofloxacin   . Clindamycin/Lincomycin   . Codeine   . Levaquin   . Vasotec Cough    Prior to Admission medications   Medication Sig Start Date End Date Taking? Authorizing Provider  allopurinol (ZYLOPRIM) 300 MG tablet Take 300 mg by mouth Daily. 06/26/11  Yes Historical Provider, MD  ANDROGEL PUMP 20.25 MG/ACT (1.62%) GEL Apply 4 application topically Daily. 05/26/11  Yes Historical Provider, MD  aspirin 81 MG tablet Take 81 mg by mouth daily.   Yes Historical Provider, MD  cycloSPORINE modified (NEORAL/GENGRAF) 100 MG capsule Take 100 mg by mouth 2 (two) times daily.  06/26/11  Yes Historical Provider, MD  furosemide (LASIX) 80 MG tablet Take 80 mg by mouth Twice daily. 07/14/11  Yes Historical Provider, MD  Hydrocodone-Acetaminophen 5-300 MG TABS Take 1 tablet by mouth Every 6 hours as needed. For pain 07/14/11  Yes Historical Provider, MD  isosorbide mononitrate (IMDUR) 60 MG 24 hr tablet Take 60 mg by mouth Daily. 07/01/11  Yes Historical Provider, MD  labetalol (NORMODYNE) 300 MG tablet Take 300 mg by mouth Twice daily. 05/26/11  Yes Historical Provider, MD  levothyroxine (SYNTHROID, LEVOTHROID) 75 MCG tablet Take 75 mg by mouth Daily. 06/26/11  Yes Historical Provider, MD  metoCLOPramide (REGLAN) 5 MG tablet Take 5 mg by mouth 4 (four) times daily - after meals and at bedtime. 05/26/11  Yes Historical Provider, MD  predniSONE (DELTASONE) 5 MG tablet Take 5 mg by mouth Daily.  06/26/11  Yes Historical Provider, MD  promethazine (PHENERGAN) 25 MG tablet Take 25 mg by mouth Every 6 hours as needed. For nausea 07/13/11  Yes Historical Provider, MD  PROVENTIL HFA 108 (90 BASE) MCG/ACT inhaler Inhale 1 puff into the lungs Every 6 hours as needed. For shortness of breath 05/17/11  Yes Historical Provider, MD  rOPINIRole (REQUIP) 1 MG tablet Take 1 mg by mouth Every evening. Take at 2000 07/18/11  Yes Historical Provider, MD  NOVOLOG 100 UNIT/ML injection Inject into the skin Continuous. 05/09/11   Historical Provider, MD  oxyCODONE (ROXICODONE) 5 MG immediate release tablet Take 1 tablet (5 mg total) by mouth every 6 (six) hours as needed for pain. 07/29/11 08/08/11  Ulyses Amor, PA   Scheduled Medications:    . benzonatate  100 mg Oral TID  . calcium acetate  1,334 mg Oral TID WC  . cyclobenzaprine  5 mg Oral Once  . cycloSPORINE modified  100 mg Oral BID  . darbepoetin (ARANESP) injection - DIALYSIS  150 mcg Intravenous Q Tue-HD  . diphenhydrAMINE  25 mg Oral QHS,MR X 1  . HYDROcodone-acetaminophen  1 tablet Oral Once  . insulin aspart  0-15 Units Subcutaneous TID WC  . insulin aspart  8 Units Subcutaneous TID WC  . insulin glargine  15 Units Subcutaneous BID  . isosorbide mononitrate  60 mg Oral Daily  . labetalol  100 mg Oral BID  . levothyroxine  75 mcg Oral QAC breakfast  . metoCLOPramide  5 mg Oral TID PC & HS  . multivitamin  1 tablet Oral Daily  . pantoprazole  40 mg Oral Q1200  . paricalcitol  2 mcg Intravenous Q T,Th,Sa-HD  . predniSONE  5 mg Oral Q breakfast  . rOPINIRole  1 mg Oral QHS  . sodium chloride  3 mL Intravenous Q12H   PRN MED's: acetaminophen, acetaminophen, albuterol, alum & mag hydroxide-simeth, heparin, heparin, heparin, heparin, HYDROcodone-acetaminophen, menthol-cetylpyridinium, ondansetron (ZOFRAN) IV, ondansetron Home: Home Living Lives With: Spouse Available Help at Discharge:  (wife works during the day; may be able to take some  FMLA) Type of Home: House Home Access: Ramped entrance (rails both sides) Home Layout: One level Bathroom Shower/Tub: Product/process development scientist: Standard Bathroom Accessibility: Yes How Accessible: Accessible via wheelchair;Accessible via walker [place]: Hand-held shower hose;Shower chair with back;Wheelchair - manual (2 manual WC)  Functional History: Prior Function Able to Take Stairs?: No Driving: No Vocation: On disability Functional Status:  Mobility: Bed Mobility Bed Mobility: Yes Supine to Sit: 5: Supervision Supine to Sit Details (indicate cue type and reason): used rail, but with flat bed to sit up via left elbow Sitting - Scoot to Edge of Bed: 6: Modified independent (Device/Increase time) Transfers Transfers: Yes Sit to Stand: Other (comment);From bed;With upper extremity assist (min guard A) Stand to Sit: With upper extremity assist;To bed;Other (comment) (min guard A) Ambulation/Gait Ambulation/Gait: Yes Ambulation/Gait Assistance: Other (comment) (min guard assist) Ambulation Distance (Feet): 5 Feet (forwar and 5' back with RW,  managed well by pt.) Assistive device: Rolling walker Gait Pattern: Step-to pattern;Decreased stride length Stairs: No Wheelchair Mobility Wheelchair Mobility: No  ADL: ADL Eating/Feeding: Simulated;Independent Where Assessed - Eating/Feeding: Bed level Grooming: Simulated;Set up;Supervision/safety Where Assessed - Grooming: Sitting, bed;Unsupported Upper Body Bathing: Simulated;Supervision/safety;Set up Where Assessed - Upper Body Bathing: Unsupported;Sitting, bed Lower Body Bathing: Simulated;Supervision/safety;Set up (with minguard A sit to stand from bed (raised)) Where Assessed - Lower Body Bathing: Unsupported;Sit to stand from bed Upper Body Dressing: Simulated;Supervision/safety;Set up Where Assessed - Upper Body Dressing: Unsupported;Sitting, bed Lower Body Dressing:  Simulated;Supervision/safety;Set up (with minguard A sit to stand from bed (raised)) Lower Body Dressing Details (indicate cue type and reason): able to don/doff LLE prothesis and right shoe post set up Where Assessed - Lower Body Dressing: Unsupported;Sit to stand from bed Toilet Transfer: Simulated (min guard A) Toilet Transfer Method: Ambulating (with RW, bed couple of steps forward and back) Toileting - Clothing Manipulation: Simulated (minguard A) Where Assessed - Toileting Clothing Manipulation: Standing Toileting - Hygiene: Simulated;Independent Where Assessed - Toileting Hygiene: Sit on 3-in-1 or toilet Tub/Shower Transfer: Not assessed Equipment Used:  (RW, gait belt) Ambulation Related to ADLs: Min guard A  Cognition: Cognition Arousal/Alertness: Lethargic Orientation Level: Oriented X4 Cognition Arousal/Alertness: Lethargic Overall Cognitive Status: Appears within functional limits for tasks assessed Orientation Level: Oriented X4 Cognition - Other Comments: was having some halluncinations  Blood pressure 144/62, pulse 87, temperature 98 F (36.7 C), temperature source Oral, resp. rate 18, height 5\' 8"  (1.727 m), weight 116.4 kg (256 lb 9.9 oz), SpO2 99.00%. Physical Exam  Nursing note and vitals reviewed. Constitutional: He is oriented to person, place, and time. He appears well-developed and well-nourished.       Ill appearing.  Restless due to back pain. obese  HENT:  Head: Normocephalic and atraumatic.  Eyes: Pupils are equal, round, and reactive to light.  Neck: Normal range of motion. Neck supple.  Cardiovascular: Normal rate and regular rhythm.   Pulmonary/Chest: Effort normal and breath sounds normal.  Abdominal: Soft. Bowel sounds are normal.  Musculoskeletal: He exhibits edema (min edema right shin.  stasis changes right shin. Old  L-BKA site well healed.  min edema right forearm.).       Left bka intact with full ROM and no pain.  RLE grossly intact for ROM.    Neurological: He is alert and oriented to person, place, and time.       Strength 4 to 4+ in all 4 limbs.  He does have stocking glove sensory loss in the RLE.   Skin: Skin is warm.  Psychiatric: He has a normal mood and affect.    Results for orders placed during the hospital encounter of 07/22/11 (from the past 24 hour(s))  GLUCOSE, CAPILLARY     Status: Abnormal   Collection Time   08/08/11 12:01 PM      Component Value Range   Glucose-Capillary 129 (*) 70 - 99 (mg/dL)  GLUCOSE, CAPILLARY     Status: Normal   Collection Time   08/08/11  5:17 PM      Component Value Range   Glucose-Capillary 96  70 - 99 (mg/dL)  GLUCOSE, CAPILLARY     Status: Abnormal   Collection Time   08/08/11  9:15 PM      Component Value Range   Glucose-Capillary 185 (*) 70 - 99 (mg/dL)   No results found.  Assessment/Plan: Diagnosis: deconditioning related to renal failure. Hx of left BKA 1. Does the need for close,  24 hr/day medical supervision in concert with the patient's rehab needs make it unreasonable for this patient to be served in a less intensive setting? No 2. Co-Morbidities requiring supervision/potential complications: see above 3. Due to bladder management, bowel management, safety, skin/wound care, disease management, medication administration, pain management and patient education, does the patient require 24 hr/day rehab nursing? No 4. Does the patient require coordinated care of a physician, rehab nurse, PT (1-2 hrs/day, 5 days/week) and OT (1-2 hrs/day, 5 days/week) to address physical and functional deficits in the context of the above medical diagnosis(es)? No Addressing deficits in the following areas: balance, endurance, locomotion, strength, transferring, bowel/bladder control and dressing 5. Can the patient actively participate in an intensive therapy program of at least 3 hrs of therapy per day at least 5 days per week? Potentially 6. The potential for patient to make measurable  gains while on inpatient rehab is fair 7. Anticipated functional outcomes upon discharge from inpatients are n/a  PT, n/a  OT,  8. Estimated rehab length of stay to reach the above functional goals is: n/a 9. Does the patient have adequate social supports to accommodate these discharge functional goals? Potentially 10. Anticipated D/C setting: Home 11. Anticipated post D/C treatments: [place] therapy 12. Overall Rehab/Functional Prognosis: excellent  RECOMMENDATIONS: This patient's condition is appropriate for continued rehabilitative care in the following setting: [facility] Patient has agreed to participate in recommended program. Yes Note that insurance prior authorization may be required for reimbursement for recommended care.  Comment: Pt is already at a CGA level for transfers and limited ambulation. He wants to go home. Home health is the most appropriate rehab venue for [name].   Oval Linsey, MD  08/09/2011

## 2011-08-09 NOTE — Procedures (Signed)
Patient was seen on dialysis and the procedure was supervised. BFR 400 Via RIJ PC BP is 131/67.  Patient appears to be tolerating treatment well but is complaining of worsening back pain not relieved with vicodin.  Will dc vicodin and switch to po dilaudid.

## 2011-08-09 NOTE — Consult Note (Signed)
Dale [name] 10/14/1965  [id number].   Requesting MD: Dr. [name] Chief Complaint/Reason for Consult: excisional LN bx HPI: This is a 46 yo male with a h/o a kidney transplant that is now failing and is being set up for dialysis.  He was admitted several weeks ago with anemia and progressive weakness.  He was found to have a hgb of 6.8 when he came to get his Fe and procrit injections.  He was admitted.  He was noted to have low grade fevers which he states has been going on for several weeks prior to admission as well.  He denies any weight loss or gain.  He has some arthralgias and was diagnosed with fibromyalgia by his PCP.  He has continued to have these low grade fevers.  He had a CT scan that revealed some lymphadenopathy.  These were biopsied by IR but not enough tissue was obtained in order to make an adequate diagnosis.  We have been asked to see for excisional biopsy.  Review of Systems: Please see HPI, otherwise they are negative.  No family history on file.  Past Medical History  Diagnosis Date  . Renal disease   . Diabetes mellitus     Past Surgical History  Procedure Date  . Appendectomy   . Hernia repair   . Kidney transplant   . Cataract extraction   . Arteriovenous graft placement   . Av fistula placement   . Toe amputation   . Below knee leg amputation   . Carpal tunnel release   . Shoulder surgery   . Av fistula placement 07/29/2011    Procedure: ARTERIOVENOUS (AV) FISTULA CREATION;  Surgeon: Mal Misty, MD;  Location: [facility];  Service: Vascular;  Laterality: Right;  Brachial cephalic  . Insertion of dialysis catheter 08/01/2011    Procedure: INSERTION OF DIALYSIS CATHETER;  Surgeon: Rosetta Posner, MD;  Location: [name] OR;  Service: Vascular;  Laterality: Right;  insertion of dialysis catheter on right internal jugular vein    Social History:  reports that he has never smoked. He does not have any smokeless tobacco history on file. He reports that he drinks alcohol. He  reports that he does not use illicit drugs.  Allergies:  Allergies  Allergen Reactions  . Amoxicillin   . Ciprofloxacin   . Clindamycin/Lincomycin   . Codeine   . Levaquin   . Vasotec Cough    Medications Prior to Admission  Medication Dose Route Frequency Provider Last Rate Last Dose  . acetaminophen (TYLENOL) 325 MG tablet           . acetaminophen (TYLENOL) tablet 650 mg  650 mg Oral Q6H PRN [name], MD   650 mg at 08/08/11 2010   Or  . acetaminophen (TYLENOL) suppository 650 mg  650 mg Rectal Q6H PRN [name], MD      . albuterol (PROVENTIL HFA;VENTOLIN HFA) 108 (90 BASE) MCG/ACT inhaler 1 puff  1 puff Inhalation Q6H PRN [name], MD   1 puff at 08/04/11 2104  . alum & mag hydroxide-simeth (MAALOX/MYLANTA) 200-200-20 MG/5ML suspension 30 mL  30 mL Oral Q6H PRN [name], MD   30 mL at 07/23/11 1856  . benzonatate (TESSALON) capsule 100 mg  100 mg Oral TID [name], MD   100 mg at 08/08/11 2116  . calcium acetate (PHOSLO) capsule 1,334 mg  1,334 mg Oral TID WC [name], MD   1,334 mg at 08/08/11 1725  .  cyclobenzaprine (FLEXERIL) tablet 5 mg  5 mg Oral Once [name], NP   5 mg at 08/09/11 0434  . cycloSPORINE modified (NEORAL/GENGRAF) capsule 100 mg  100 mg Oral BID [name], MD   100 mg at 08/08/11 2116  . darbepoetin (ARANESP) injection 150 mcg  150 mcg Intravenous Q Tue-HD [name], MD   150 mcg at 08/09/11 [phone]  . diphenhydrAMINE (BENADRYL) capsule 25 mg  25 mg Oral QHS,MR X 1 [name], MD   25 mg at 08/08/11 2341  . fentaNYL (SUBLIMAZE) 0.05 MG/ML injection           . furosemide (LASIX) injection 80 mg  80 mg Intravenous Once Estanislado Emms, MD   80 mg at 07/28/11 1556  . heparin injection 2,300 Units  20 Units/kg Dialysis PRN [name], MD   2,300 Units at 08/06/11 1257  . heparin injection 2,400 Units  20 Units/kg Dialysis PRN Estanislado Emms, MD      . heparin injection 2,400 Units  20 Units/kg Dialysis PRN  [name], MD   2,400 Units at 08/02/11 1659  . heparin injection 2,400 Units  20 Units/kg Dialysis PRN [name], MD      . HYDROcodone-acetaminophen [facility]) 5-325 MG per tablet 1 tablet  1 tablet Oral Once [name], NP   1 tablet at 08/09/11 0249  . HYDROmorphone (DILAUDID) tablet 2 mg  2 mg Oral Q6H PRN [name], MD   2 mg at 08/09/11 1027  . insulin aspart (novoLOG) injection 0-15 Units  0-15 Units Subcutaneous TID WC [name], MD   2 Units at 08/08/11 1248  . insulin aspart (novoLOG) injection 6 Units  6 Units Subcutaneous Once [name], NP   6 Units at 07/24/11 0345  . insulin aspart (novoLOG) injection 8 Units  8 Units Subcutaneous TID WC [name], MD   8 Units at 08/08/11 1248  . insulin glargine (LANTUS) injection 15 Units  15 Units Subcutaneous BID [name], MD   15 Units at 08/08/11 2116  . isosorbide mononitrate (IMDUR) 24 hr tablet 60 mg  60 mg Oral Daily [name], MD   60 mg at 08/08/11 1033  . labetalol (NORMODYNE) tablet 100 mg  100 mg Oral BID [name], MD   100 mg at 08/08/11 2116  . levothyroxine (SYNTHROID, LEVOTHROID) tablet 75 mcg  75 mcg Oral QAC breakfast [name], MD   75 mcg at 08/08/11 [id number]  . LORazepam (ATIVAN) injection 1 mg  1 mg Intravenous Once [name], MD   1 mg at 08/04/11 1620  . LORazepam (ATIVAN) injection 1 mg  1 mg Intravenous Once [name], MD   1 mg at 08/06/11 1405  . LORazepam (ATIVAN) injection 1 mg  1 mg Intravenous Once [name], MD   1 mg at 08/06/11 1436  . menthol-cetylpyridinium (CEPACOL) lozenge 3 mg  1 lozenge Oral PRN [name], MD      . metoCLOPramide (REGLAN) tablet 5 mg  5 mg Oral TID PC & HS [name], MD   5 mg at 08/08/11 2116  . multivitamin (RENA-VIT) tablet 1 tablet  1 tablet Oral Daily Estanislado Emms, MD   1 tablet at 08/08/11 1033  . ondansetron (ZOFRAN) tablet 4 mg  4 mg Oral Q6H PRN [name], MD   4 mg at 08/07/11 1953  Or  . ondansetron (ZOFRAN) injection 4 mg  4 mg Intravenous Q6H PRN [name], MD   4 mg at 08/09/11 0929  . pantoprazole (PROTONIX) EC tablet 40 mg  40 mg Oral Q1200 [name], MD   40 mg at 08/08/11 1247  . paricalcitol (ZEMPLAR) 5 MCG/ML injection           . paricalcitol (ZEMPLAR) 5 MCG/ML injection           . paricalcitol (ZEMPLAR) injection 2 mcg  2 mcg Intravenous Q T,Th,Sa-HD [name], MD   2 mcg at 08/09/11 [phone]  . predniSONE (DELTASONE) tablet 5 mg  5 mg Oral Q breakfast [name], MD   5 mg at 08/08/11 [id number]  . rOPINIRole (REQUIP) tablet 1 mg  1 mg Oral QHS [name], MD   1 mg at 08/08/11 2116  . sodium chloride 0.9 % injection 3 mL  3 mL Intravenous Q12H [name], MD   3 mL at 08/08/11 2116  . sodium polystyrene (KAYEXALATE) 15 GM/60ML suspension 15 g  15 g Oral Once Samuella Cota, MD   15 g at 07/30/11 1438  . sodium polystyrene (KAYEXALATE) 15 GM/60ML suspension 30 g  30 g Oral Once Estanislado Emms, MD   30 g at 07/31/11 0849  . sodium polystyrene (KAYEXALATE) 15 GM/60ML suspension 30 g  30 g Oral Once Samuella Cota, MD   30 g at 07/31/11 1543  . DISCONTD: 0.9 %  sodium chloride infusion   Intravenous Q14 Days Windy Kalata, MD 10 mL/hr at 07/22/11 1414    . DISCONTD: 0.9 %  sodium chloride infusion   Intravenous Continuous Estanislado Emms, MD 10 mL/hr at 07/29/11 1002    . DISCONTD: 0.9 % irrigation (POUR BTL)    PRN Mal Misty, MD   1,000 mL at 07/29/11 1119  . DISCONTD: 0.9 % irrigation (POUR BTL)    PRN Rosetta Posner, MD   1,000 mL at 08/01/11 1259  . DISCONTD: ALPRAZolam Duanne Moron) tablet 0.5 mg  0.5 mg Oral QHS PRN Toy Baker, MD   0.5 mg at 08/06/11 2201  . DISCONTD: calcium acetate (PHOSLO) capsule 667 mg  667 mg Oral TID WC Estanislado Emms, MD   667 mg at 08/02/11 1217  . DISCONTD: darbepoetin (ARANESP) 150 MCG/0.3ML injection           . DISCONTD: diphenhydrAMINE (BENADRYL) injection 25 mg  25 mg Intravenous Q6H PRN  [name], MD      . DISCONTD: droperidol (INAPSINE) injection 0.625 mg  0.625 mg Intravenous PRN Delma Freeze, MD      . DISCONTD: epoetin alfa (EPOGEN,PROCRIT) 09811 UNIT/ML injection           . DISCONTD: epoetin alfa (EPOGEN,PROCRIT) injection 40,000 Units  40,000 Units Subcutaneous Q14 Days Windy Kalata, MD   40,000 Units at 07/22/11 1414  . DISCONTD: fentaNYL (SUBLIMAZE) 0.05 MG/ML injection           . DISCONTD: ferumoxytol [facility]) injection 510 mg  510 mg Intravenous Q14 Days Windy Kalata, MD   510 mg at 07/22/11 1413  . DISCONTD: furosemide (LASIX) injection 10 mg  10 mg Intravenous Q4H [name], MD      . DISCONTD: guaiFENesin-dextromethorphan (ROBITUSSIN DM) 100-10 MG/5ML syrup 5 mL  5 mL Oral Q4H PRN [name], MD   5 mL at 08/01/11 0241  . DISCONTD: heparin  6,000 Units in sodium chloride irrigation 0.9 % 500 mL irrigation    PRN Mal Misty, MD      . DISCONTD: heparin 6,000 Units in sodium chloride irrigation 0.9 % 500 mL irrigation    PRN Rosetta Posner, MD      . DISCONTD: heparin injection    PRN Rosetta Posner, MD   5 mL at 08/01/11 1307  . DISCONTD: HYDROcodone-acetaminophen (NORCO) 5-325 MG per tablet 1 tablet  1 tablet Oral Q6H PRN [name], MD   1 tablet at 08/09/11 0923  . DISCONTD: HYDROmorphone (DILAUDID) injection 0.25-0.5 mg  0.25-0.5 mg Intravenous Q5 min PRN Delma Freeze, MD      . DISCONTD: HYDROmorphone (DILAUDID) injection 0.25-0.5 mg  0.25-0.5 mg Intravenous Q5 min PRN Warrick Parisian, MD      . DISCONTD: HYDROmorphone (DILAUDID) injection 1 mg  1 mg Intravenous Q4H PRN [name], MD   1 mg at 08/07/11 0828  . DISCONTD: insulin aspart (novoLOG) injection 0-20 Units  0-20 Units Subcutaneous TID WC [name], MD   7 Units at 07/26/11 1153  . DISCONTD: insulin aspart (novoLOG) injection 0-5 Units  0-5 Units Subcutaneous QHS [name], MD   3 Units at 08/01/11 1422  . DISCONTD: insulin aspart  (novoLOG) injection 0-9 Units  0-9 Units Subcutaneous TID WC [name], MD   9 Units at 07/24/11 0903  . DISCONTD: insulin aspart (novoLOG) injection 0-9 Units  0-9 Units Subcutaneous TID WC [name], MD   7 Units at 08/03/11 0854  . DISCONTD: insulin aspart (novoLOG) injection 3 Units  3 Units Subcutaneous TID WC [name], MD   3 Units at 07/26/11 1153  . DISCONTD: insulin aspart (novoLOG) injection 6 Units  6 Units Subcutaneous TID WC [name], MD   6 Units at 08/03/11 1304  . DISCONTD: insulin glargine (LANTUS) injection 20 Units  20 Units Subcutaneous QHS [name], NP   20 Units at 07/25/11 2156  . DISCONTD: insulin glargine (LANTUS) injection 25 Units  25 Units Subcutaneous QHS [name], MD   25 Units at 07/30/11 2352  . DISCONTD: insulin glargine (LANTUS) injection 30 Units  30 Units Subcutaneous QHS Samuella Cota, MD   30 Units at 08/02/11 2244  . DISCONTD: labetalol (NORMODYNE) tablet 300 mg  300 mg Oral BID [name], MD   300 mg at 08/03/11 1110  . DISCONTD: lidocaine-EPINEPHrine (XYLOCAINE W/EPI) 0.5-1:200000 % (with pres) injection    PRN Mal Misty, MD   50 mL at 07/29/11 1120  . DISCONTD: lidocaine-EPINEPHrine (XYLOCAINE W/EPI) 0.5-1:200000 % (with pres) injection    PRN Mal Misty, MD   50 mL at 07/29/11 1134  . DISCONTD: lidocaine-EPINEPHrine (XYLOCAINE W/EPI) 0.5-1:200000 % (with pres) injection    PRN Rosetta Posner, MD   9 mL at 08/01/11 1259  . DISCONTD: LORazepam (ATIVAN) injection 0.5 mg  0.5 mg Intravenous Once [name], MD      . DISCONTD: midazolam (VERSED) 2 MG/2ML injection           . DISCONTD: midazolam (VERSED) 2 MG/2ML injection           . DISCONTD: ondansetron (ZOFRAN) injection 4 mg  4 mg Intravenous Once PRN Warrick Parisian, MD      . DISCONTD: promethazine (PHENERGAN) tablet 25 mg  25 mg Oral Q6H PRN [name], MD   25 mg  at 08/06/11 0207  . DISCONTD: vancomycin (VANCOCIN) IVPB 1000 mg/200 mL  premix  1,000 mg Intravenous 60 min Pre-Op Conrad Ivanhoe, MD       Medications Prior to Admission  Medication Sig Dispense Refill  . allopurinol (ZYLOPRIM) 300 MG tablet Take 300 mg by mouth Daily.      . ANDROGEL PUMP 20.25 MG/ACT (1.62%) GEL Apply 4 application topically Daily.      . cycloSPORINE modified (NEORAL/GENGRAF) 100 MG capsule Take 100 mg by mouth 2 (two) times daily.       . furosemide (LASIX) 80 MG tablet Take 80 mg by mouth Twice daily.      . Hydrocodone-Acetaminophen 5-300 MG TABS Take 1 tablet by mouth Every 6 hours as needed. For pain      . isosorbide mononitrate (IMDUR) 60 MG 24 hr tablet Take 60 mg by mouth Daily.      [name] Kitchen labetalol (NORMODYNE) 300 MG tablet Take 300 mg by mouth Twice daily.      [name] Kitchen levothyroxine (SYNTHROID, LEVOTHROID) 75 MCG tablet Take 75 mg by mouth Daily.      . metoCLOPramide (REGLAN) 5 MG tablet Take 5 mg by mouth 4 (four) times daily - after meals and at bedtime.      . predniSONE (DELTASONE) 5 MG tablet Take 5 mg by mouth Daily.      . promethazine (PHENERGAN) 25 MG tablet Take 25 mg by mouth Every 6 hours as needed. For nausea      . PROVENTIL HFA 108 (90 BASE) MCG/ACT inhaler Inhale 1 puff into the lungs Every 6 hours as needed. For shortness of breath      . rOPINIRole (REQUIP) 1 MG tablet Take 1 mg by mouth Every evening. Take at 2000      . NOVOLOG 100 UNIT/ML injection Inject into the skin Continuous.      [name] Kitchen oxyCODONE (ROXICODONE) 5 MG immediate release tablet Take 1 tablet (5 mg total) by mouth every 6 (six) hours as needed for pain.  30 tablet  0    Blood pressure 145/74, pulse 92, temperature 97.9 F (36.6 C), temperature source Oral, resp. rate 18, height 5\' 8"  (1.727 m), weight 256 lb 9.9 oz (116.4 kg), SpO2 99.00%. Physical Exam: General: pleasant, obese white male who is laying in bed in NAD HEENT: head is normocephalic, atraumatic.  Sclera are noninjected.  PERRL.  Ears and nose without any masses or lesions.  Mouth is pink and  moist Heart: regular, rate, and rhythm.  Normal s1,s2. No obvious murmurs, gallops, or rubs noted.  Palpable radial and pedal pulses, no left leg Lungs: CTAB, no wheezes, rhonchi, or rales noted.  Respiratory effort nonlabored Abd: soft, NT, ND, morbidly obese, no masses, hernias, or organomegaly.  He does have a sacr in his right groin from renal transplant. Lymph, a few shoddy LNs are palpable in the left groin, otherwise no other LAD is palpable in right groin, axilla, or along the cervical chain. Psych: A&Ox3 with an appropriate affect.    Results for orders placed during the hospital encounter of 07/22/11 (from the past 48 hour(s))  GLUCOSE, CAPILLARY     Status: Abnormal   Collection Time   08/07/11  4:34 PM      Component Value Range Comment   Glucose-Capillary 127 (*) 70 - 99 (mg/dL)   GLUCOSE, CAPILLARY     Status: Abnormal   Collection Time   08/07/11  9:26 PM      Component Value  Range Comment   Glucose-Capillary 154 (*) 70 - 99 (mg/dL)    Comment 1 Documented in Chart      Comment 2 Notify RN     GLUCOSE, CAPILLARY     Status: Abnormal   Collection Time   08/08/11  7:34 AM      Component Value Range Comment   Glucose-Capillary 283 (*) 70 - 99 (mg/dL)    Comment 1 Notify RN      Comment 2 Documented in Chart     GLUCOSE, CAPILLARY     Status: Abnormal   Collection Time   08/08/11 12:01 PM      Component Value Range Comment   Glucose-Capillary 129 (*) 70 - 99 (mg/dL)   GLUCOSE, CAPILLARY     Status: Normal   Collection Time   08/08/11  5:17 PM      Component Value Range Comment   Glucose-Capillary 96  70 - 99 (mg/dL)   GLUCOSE, CAPILLARY     Status: Abnormal   Collection Time   08/08/11  9:15 PM      Component Value Range Comment   Glucose-Capillary 185 (*) 70 - 99 (mg/dL)   GLUCOSE, CAPILLARY     Status: Abnormal   Collection Time   08/09/11  8:09 AM      Component Value Range Comment   Glucose-Capillary 280 (*) 70 - 99 (mg/dL)   CBC     Status: Abnormal    Collection Time   08/09/11  8:39 AM      Component Value Range Comment   WBC 8.0  4.0 - 10.5 (K/uL)    RBC 3.38 (*) 4.22 - 5.81 (MIL/uL)    Hemoglobin 8.9 (*) 13.0 - 17.0 (g/dL)    HCT 29.4 (*) 39.0 - 52.0 (%)    MCV 87.0  78.0 - 100.0 (fL)    MCH 26.3  26.0 - 34.0 (pg)    MCHC 30.3  30.0 - 36.0 (g/dL)    RDW 18.0 (*) 11.5 - 15.5 (%)    Platelets 381  150 - 400 (K/uL)   RENAL FUNCTION PANEL     Status: Abnormal   Collection Time   08/09/11  8:39 AM      Component Value Range Comment   Sodium 128 (*) 135 - 145 (mEq/L)    Potassium 4.2  3.5 - 5.1 (mEq/L)    Chloride 91 (*) 96 - 112 (mEq/L)    CO2 23  19 - 32 (mEq/L)    Glucose, Bld 289 (*) 70 - 99 (mg/dL)    BUN 66 (*) 6 - 23 (mg/dL)    Creatinine, Ser 3.63 (*) 0.50 - 1.35 (mg/dL)    Calcium 8.7  8.4 - 10.5 (mg/dL)    Phosphorus 4.9 (*) 2.3 - 4.6 (mg/dL)    Albumin 1.4 (*) 3.5 - 5.2 (g/dL)    GFR calc non Af Amer 19 (*) >90 (mL/min)    GFR calc Af Amer 22 (*) >90 (mL/min)    No results found.     Assessment/Plan 1. FUO with lymphadenopathy  Plan: 1. We have discussed this case with pathology.  They did not receive enough tissue for a definitive diagnosis with the core biopsies.  We will plan on surgical biopsy tomorrow if we are able.    Simrit Gohlke E 08/09/2011, 2:58 PM

## 2011-08-09 NOTE — Progress Notes (Signed)
Subjective:  Feeling better, no complaints.  Objective: Vital signs in last 24 hours: Temp:  [97.8 F (36.6 C)-98 F (36.7 C)] 98 F (36.7 C) (04/16 0435) Pulse Rate:  [80-87] 87  (04/16 0435) Resp:  [18-20] 18  (04/16 0435) BP: (117-146)/(62-73) 144/62 mmHg (04/16 0435) SpO2:  [97 %-99 %] 99 % (04/16 0435) Weight:  [116.4 kg (256 lb 9.9 oz)] 116.4 kg (256 lb 9.9 oz) (04/15 2129) Weight change: 1.6 kg (3 lb 8.4 oz)  Intake/Output from previous day: 04/15 0701 - 04/16 0700 In: 600 [P.O.:600] Out: 802 [Urine:800; Stool:2]   EXAM: General appearance:  Alert, in no apparent distress Resp:  CTA bilaterally Cardio:  RRR without murmur GI:  Obese, + BS, soft and notender Extremities:  Trace edema on R, L BKA Access:  Right IJ catheter, also AVF @ RUA with + bruit (placed 4/5)  Lab Results: No results found for this basename: WBC:2,HGB:2,HCT:2,PLT:2 in the last 72 hours BMET: No results found for this basename: NA:2,K:2,CL:2,CO2:2,GLUCOSE:2,BUN:2,CREATININE:2,CALCIUM:2,PHOSPHORUS:2,ALBUMIN:2 in the last 72 hours No results found for this basename: PTH:2 in the last 72 hours Iron Studies: No results found for this basename: IRON,TIBC,TRANSFERRIN,FERRITIN in the last 72 hours  Assessment/Plan: 1.  ESRD - New start to HD after failed renal transplant, restarted on 4/8, on TTS using right IJ catheter placed on 4/8; on cyclosporine & prednisone.  Next HD pending today. 2.  HTN/Volume - BP fairly well controlled (most recently 144/62), on Labetalol 100 mg bid; wt up to 116.4 kg today.  UF goal of 3-4 L today. 3.  Anemia - Hgb up to 8.9, Fe sat 11%, but Ferritin 2190; on Aranesp 150 mcg on Tues. 4.  Secondary Hyperparathyroidism - Ca 8.7 (10.2 corrected), P 6, iPTH 172.4, on Phoslo 2 with meals, Zemplar 2 mcg per HD. 5.  Fever/LAN - CT scan showed new retroperitoneal and mesenteric lymphadenopathy. LN Bx done yest by IR 6.  Disposition - short-term rehab at [facility] : [facility] pending, but awaiting setup  for outpt HD.     LOS: 18 days   LYLES,CHARLES 08/09/2011,7:03 AM   I have seen and examined this patient and agree with plan as outlined above. Ishmel Acevedo A,MD 08/09/2011 10:18 AM

## 2011-08-09 NOTE — Progress Notes (Signed)
   CARE MANAGEMENT NOTE 08/09/2011  Patient:  Dale [name], Dale [name]   Account Number:  [id number]  Date Initiated:  07/25/2011  Documentation initiated by:  [name]  Subjective/Objective Assessment:   PT WAS ADMITTED WITH ANEMIA     Action/Plan:   PROGRESSION OF CARE AND DISCHARGE PLANNING   Anticipated DC Date:  08/11/2011   Anticipated DC Plan:  SKILLED NURSING FACILITY  In-house referral  Clinical Social Worker      DC Planning Services  CM consult      Choice offered to / List presented to:             Status of service:  In process, will continue to follow Medicare Important Message given?   (If response is "NO", the following Medicare IM given date fields will be blank) Date Medicare IM given:   Date Additional Medicare IM given:    Discharge Disposition:    Per UR Regulation:  Reviewed for med. necessity/level of care/duration of stay  If discussed at [place] of Stay Meetings, dates discussed:   08/03/2011    Comments:  08/09/11 [name], RN, BSN [place], FAXING PT OUT FOR SNF AS BACK UP, AWAITING RENAL TO GIVE [place] AN OP HD CENTER.  08/08/11 [name], RN, BSN 1536 PT HAS NO LTACH BENEFITS, PT IS OK WITH SNF.  HE WOULD LIKE TO BE FAXED OUT FOR [place] AND [place].  INFORMED CSW.  WILL F/U.  08/05/11 [name], RN, BSN 1610 UR COMPLETED.  PT IS TO HAVE A NEEDLE BX TODAY.  PT HAS BEEN DENIED BY [facility] AND [facility] HAS NO HD BEDS AT THIS TIME.  WILL F/U WITH [facility] ON MONDAY.  08/03/11 Orie Cuttino, RN, BSN 1655 PT'S WIFE HAS STATED THAT SHE CANT TAKE CARE OF HIM AT DC IF HE CONTS TO BE AS WEAK.  PT/OT TO EVAL, CIR WILL LOOK AT HIM IF IT IS RECOMMENDED.  UR COMPLETED 08/02/11 [name], RN, BNS 1239  UR COMPLETED 07/29/11 [name], RN, BSN [place] 07/25/11 [name], RN, BSN [phone]

## 2011-08-09 NOTE — Progress Notes (Signed)
Physical Therapy Treatment Patient Details Name: Dale [name] MRN: [id number] DOB: [birth year] Today's Date: 08/09/2011  PT Assessment/Plan  PT - Assessment/Plan Comments on Treatment Session: Improving amb distance and activity tol; patient reports is glad to be able to dc home; Discussed using RW for safety if pt feels particularly weak, and HHPT will work with pt to increase independence and safety with functional mobility at home PT Plan: Discharge plan needs to be updated PT Frequency: Min 3X/week Follow Up Recommendations: Home health PT;Supervision - Intermittent Equipment Recommended: 3 in 1 bedside comode PT Goals  Acute Rehab PT Goals Time For Goal Achievement: 7 days Pt will go Supine/Side to Sit: with modified independence PT Goal: Supine/Side to Sit - Progress: Progressing toward goal Pt will go Sit to Stand: with modified independence;with upper extremity assist PT Goal: Sit to Stand - Progress: Progressing toward goal Pt will Ambulate: 16 - 50 feet;with modified independence;with least restrictive assistive device PT Goal: Ambulate - Progress: Progressing toward goal  PT Treatment Precautions/Restrictions  Precautions Precautions: Fall Required Braces or Orthoses: Other Brace/Splint Other Brace/Splint: LLE prothesis Restrictions Weight Bearing Restrictions: No Mobility (including Balance) Bed Mobility Supine to Sit: 5: Supervision Supine to Sit Details (indicate cue type and reason): Inefficient movement, but safely getting to EOB Sitting - Scoot to Edge of Bed: 6: Modified independent (Device/Increase time) Transfers Sit to Stand: 4: Min assist;From bed (minguard assist) Sit to Stand Details (indicate cue type and reason): cues for safety, hand placement Stand to Sit: 4: Min assist;To chair/3-in-1;With upper extremity assist (minguard assist) Stand to Sit Details: cues for hand placement and to control descent Ambulation/Gait Ambulation/Gait Assistance: 4: Min  assist (Guard assist with and without physical contact) Ambulation/Gait Assistance Details (indicate cue type and reason): Cues for safety, self-monitor for activity tol, and for posture and RW proximity Ambulation Distance (Feet): 110 Feet (one seated rest break) Assistive device: Rolling walker Gait Pattern: Lateral trunk lean to right;Lateral trunk lean to left;Decreased stride length       End of Session PT - End of Session Equipment Utilized During Treatment: Gait belt;Left lower extremity prosthesis Activity Tolerance: Patient tolerated treatment well Patient left: in chair;with call bell in reach General Behavior During Session: [facility] for tasks performed Cognition: [facility] for tasks performed  Roney Marion [facility] [place], [place]  08/09/2011, 4:21 PM

## 2011-08-09 NOTE — Progress Notes (Addendum)
Dale [name] [id number] is a 46 y.o. male,  Outpatient Primary MD for the patient is No primary provider on file.  Chief Complaint  Patient presents with  . Weakness  . Shortness of Breath        Subjective:    Willliam [name] today has, No headache, No chest pain, No abdominal pain - No Nausea, No new weakness tingling or numbness, now no Cough - No SOB.    Objective:   Filed Vitals:   08/09/11 1200 08/09/11 1230 08/09/11 1243 08/09/11 1400  BP: 149/73 158/71 151/71 145/74  Pulse: 83 83 84 92  Temp:   97.4 F (36.3 C) 97.9 F (36.6 C)  TempSrc:   Oral Oral  Resp: 18 14 16 18   Height:      Weight:      SpO2:   96% 99%    Wt Readings from Last 3 Encounters:  08/09/11 116.4 kg (256 lb 9.9 oz)  08/09/11 116.4 kg (256 lb 9.9 oz)  08/09/11 116.4 kg (256 lb 9.9 oz)     Intake/Output Summary (Last 24 hours) at 08/09/11 1450 Last data filed at 08/09/11 1300  Gross per 24 hour  Intake    480 ml  Output   2009 ml  Net  -1529 ml    Exam Awake alert No new F.N deficits, Normal affect [place].AT,PERRAL Supple Neck,No JVD, No cervical lymphadenopathy appriciated.  Symmetrical Chest wall movement, Good air movement bilaterally, CTAB RRR,No Gallops,Rubs or new Murmurs, No Parasternal Heave +ve B.Sounds, Abd Soft, Non tender, No organomegaly appriciated, No rebound -guarding or rigidity. 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5 3.9 3.8  CL 91* 93* 94* 94* 97  CO2 23 22 23 25 23   GLUCOSE 289* 250* 51* 333* 263*  BUN 66* 68* 84* 66* 107*  CREATININE 3.63* 4.81* 5.40* 3.90* 4.64*  CALCIUM 8.7 8.7 8.7 8.7 8.8  MG -- -- -- -- --  AST -- -- -- -- --  ALT -- -- -- -- --  ALKPHOS -- -- -- -- --  BILITOT -- -- -- -- --   ------------------------------------------------------------------------------------------------------------------ estimated creatinine clearance is 31.8 ml/min (by C-G formula based on Cr of 3.63). ------------------------------------------------------------------------------------------------------------------ No results found for this basename: HGBA1C:2 in the last 72 hours ------------------------------------------------------------------------------------------------------------------ No results found for this basename: CHOL:2,HDL:2,LDLCALC:2,TRIG:2,CHOLHDL:2,LDLDIRECT:2 in the last 72 hours ------------------------------------------------------------------------------------------------------------------ No results found for this basename: TSH,T4TOTAL,FREET3,T3FREE,THYROIDAB in the last 72 hours ------------------------------------------------------------------------------------------------------------------ No results found for this basename: VITAMINB12:2,FOLATE:2,FERRITIN:2,TIBC:2,IRON:2,RETICCTPCT:2 in the last 72 hours  Coagulation profile  Lab 08/05/11 1109  INR 1.63*  PROTIME --    No results found for this basename: DDIMER:2 in the last 72 hours  Cardiac Enzymes No results found for this  basename: CK:3,CKMB:3,TROPONINI:3,MYOGLOBIN:3 in the last 168 hours ------------------------------------------------------------------------------------------------------------------ No components found with this basename: POCBNP:3  Micro Results Recent Results (from the past 240 hour(s))  CULTURE, BLOOD (ROUTINE X 2)     Status: Normal    Collection Time   08/02/11  3:52 PM      Component Value Range Status Comment   Specimen Description BLOOD HEMODIALYSIS CATHETER   Final    Special Requests     Final    Value: BOTTLES DRAWN AEROBIC AND ANAEROBIC 10CC RIGHT IJ CATH   Culture  Setup Time [id number]   Final    Culture NO GROWTH 5 DAYS   Final    Report Status 08/09/2011 FINAL   Final   CULTURE, BLOOD (ROUTINE X 2)     Status: Normal   Collection Time   08/02/11  5:40 PM      Component Value Range Status Comment   Specimen Description BLOOD HEMODIALYSIS CATHETER   Final    Special Requests BOTTLES DRAWN AEROBIC AND ANAEROBIC 10CC RIGHT IJ   Final    Culture  Setup Time [id number]   Final    Culture NO GROWTH 5 DAYS   Final    Report Status 08/09/2011 FINAL   Final   URINE CULTURE     Status: Normal   Collection Time   08/04/11  4:11 PM      Component Value Range Status Comment   Specimen Description URINE, RANDOM   Final    Special Requests NONE   Final    Culture  Setup Time [id number]   Final    Colony Count >=100,000 COLONIES/ML   Final    Culture YEAST   Final    Report Status 08/06/2011 FINAL   Final   AFB CULTURE WITH SMEAR     Status: Normal (Preliminary result)   Collection Time   08/05/11  4:52 PM      Component Value Range Status Comment   Specimen Description TISSUE LYMPH NODE   Final    Special Requests NONE   Final    ACID FAST SMEAR NO ACID FAST BACILLI SEEN   Final    Culture     Final    Value: CULTURE WILL BE EXAMINED FOR 6 WEEKS BEFORE ISSUING A FINAL REPORT   Report Status PENDING   Incomplete   FUNGUS CULTURE W SMEAR     Status: Normal (Preliminary result)   Collection Time   08/05/11  4:52 PM      Component Value Range Status Comment   Specimen Description TISSUE LYMPH NODE   Final    Special Requests NONE   Final    Fungal Smear NO YEAST OR FUNGAL ELEMENTS SEEN   Final    Culture CULTURE IN PROGRESS FOR FOUR WEEKS   Final    Report Status PENDING   Incomplete   TISSUE CULTURE      Status: Normal   Collection Time   08/05/11  4:52 PM      Component Value Range Status Comment   Specimen Description TISSUE LYMPH NODE   Final    Special Requests NONE   Final    Gram Stain     Final    Value: NO WBC SEEN     NO SQUAMOUS EPITHELIAL CELLS SEEN     NO ORGANISMS SEEN   Culture NO GROWTH 3 DAYS   Final    Report Status 08/09/2011 FINAL   Final     Radiology Reports Dg Chest  2 View  08/02/2011  *RADIOLOGY REPORT*  Clinical Data: History of difficulty breathing.  History of fever.  CHEST - 2 VIEW  Comparison: 07/22/2011.  08/01/2011.  Findings: Right sided dialysis catheter has tips in right atrial region.  There is stable cardiac silhouette enlargement.  There is upper lobe vascular prominence with pulmonary venous hypertension vascular congestion pattern.  No consolidation or definite pleural effusion is seen.  There is slight elevation of the margin of the left hemidiaphragm with minimal basilar atelectasis. On the lateral image there appears to be some posterior atelectasis and patchy infiltrative density without consolidation.  There is minimal degenerative spondylosis.  IMPRESSION: Dialysis catheter in place.  Stable cardiac silhouette enlargement. Pulmonary venous hypertension vascular congestion pattern.  No consolidation or pleural effusion. Left basilar and posterior atelectasis and patchy infiltrative densities without consolidation.  Original Report Authenticated By: Delane Ginger, M.D.   Dg Chest 2 View  07/22/2011  *RADIOLOGY REPORT*  Clinical Data: Fever for 1 week with cough, shortness of breath, hypertension and diabetes  CHEST - 2 VIEW  Comparison: 10/19/2004  Findings: Heart and mediastinal contours are stable with an unchanged degree of mild cardiomegaly.  The lung fields are notable for mild pulmonary vascular congestion.  No overt congestive failure, interstitial septal lines or significant peribronchial cuffing is seen to suggest early interstitial edema. A slight  increase in interstitial markings are seen diffusely and may indicate underlying viral pneumonitis.  No focal infiltrates are seen to suggest an area of focal bronchopneumonia.  No pleural fluid is seen.  Bony structures appear intact.  IMPRESSION: Mild pulmonary vascular congestion and mild increase in interstitial markings suspicious for viral pneumonitis given history of fever.  No findings suggestive of bronchopneumonia.  Original Report Authenticated By: Ander Gaster, M.D.   US Abdomen Limited  07/14/2011  *RADIOLOGY REPORT*  Clinical Data:  Elevated liver function tests, history of renal transplantation  LIMITED ABDOMINAL ULTRASOUND - RIGHT UPPER QUADRANT  Comparison:  CT abdomen of 08/26/2004  Findings:  Gallbladder:  The gallbladder is visualized and no gallstones are noted.  There is no pain over the gallbladder with compression.  Common bile duct:  The common bile duct is normal measuring 5.0 mm in diameter.  Liver:  The liver is echogenic consistent with fatty infiltration. Also the liver measures 20.1 cm sagittally indicating hepatomegaly. No ductal dilatation is seen.  IMPRESSION:  1.  Fatty infiltration of the liver.  Hepatomegaly. 2.  No gallstones.                   Original Report Authenticated By: Joretta Bachelor, M.D.   Dg Chest Port 1 View  08/01/2011  *RADIOLOGY REPORT*  Clinical Data: Central line placement.  PORTABLE CHEST - 1 VIEW  Comparison: 07/22/2011.  Findings: 1504 hours.  Right IJ dialysis catheter tips are present within the right atrium.  There is stable cardiomegaly with chronic vascular congestion.  No confluent airspace opacity, significant pleural effusion or pneumothorax is seen.  IMPRESSION: Dialysis catheter placement without demonstrated pneumothorax. Stable cardiomegaly and vascular congestion.  Original Report Authenticated By: Vivia Ewing, M.D.     Scheduled Meds:    . benzonatate  100 mg Oral TID  . calcium acetate  1,334 mg Oral TID WC  .  cyclobenzaprine  5 mg Oral Once  . cycloSPORINE modified  100 mg Oral BID  . darbepoetin (ARANESP) injection - DIALYSIS  150 mcg Intravenous Q Tue-HD  . diphenhydrAMINE  25 mg Oral QHS,MR  X 1  . HYDROcodone-acetaminophen  1 tablet Oral Once  . insulin aspart  0-15 Units Subcutaneous TID WC  . insulin aspart  8 Units Subcutaneous TID WC  . insulin glargine  15 Units Subcutaneous BID  . isosorbide mononitrate  60 mg Oral Daily  . labetalol  100 mg Oral BID  . levothyroxine  75 mcg Oral QAC breakfast  . metoCLOPramide  5 mg Oral TID PC & HS  . multivitamin  1 tablet Oral Daily  . pantoprazole  40 mg Oral Q1200  . paricalcitol  2 mcg Intravenous Q T,Th,Sa-HD  . predniSONE  5 mg Oral Q breakfast  . rOPINIRole  1 mg Oral QHS  . sodium chloride  3 mL Intravenous Q12H   Continuous Infusions:  PRN Meds:.acetaminophen, acetaminophen, albuterol, alum & mag hydroxide-simeth, heparin, heparin, heparin, heparin, HYDROmorphone, menthol-cetylpyridinium, ondansetron (ZOFRAN) IV, ondansetron, DISCONTD: HYDROcodone-acetaminophen  Assessment & Plan   46 year old man presented to short stay for iron infusion. Was found to have a hemoglobin of 6.7 and referred to the emergency department. There is also some concern of fever upon admission thought to be Viral URI at that time, however fever resolved immediately an initial infectious workup was unremarkable. He has Anemia (AOCD) and had PRBC transfusion initially,  he remained hospitalized because of worsening renal failure - when this failed to significantly improved the patient underwent AV graft placement and temporary dialysis catheter placement and was started on hemodialysis April 8. He developed fever and April 8 for the first time since admission. ID was consulted and after CT scan of abdomen and pelvis it was found that patient had diffuse lymphadenopathy suspicious for lymphoma. He should was offered excision biopsy versus CT-guided biopsy by  interventional radiology, patient at that time opted not to have excisional biopsy and first give a trial of CT-guided needle biopsy, he was clearly told that this was not the best method of obtaining samples however he at that time was insistent that CT-guided biopsy be given a shot first, however now his delirium has improved and he is agreeable to excision biopsy if needed in the future. I have talked to pathologist on-call on 08/08/2011 he is reviewing the samples with special staining and has sent samples for flow cytometry also. April 16 I discussed the biopsy results with pathologist on-call doctor Shur who recommended that there is clinical uncertainty with the present specimen, general surgery called for excision biopsy. After excisional biopsy patient can have outpatient followup with him on, we had consulted [facility], however patient did not meet criteria for either, social work has been requested to find another facility for the patient as wife has expressed that she cannot take care of him at home at this point.    Past medical history:   End-stage renal disease secondary to diabetic nephropathy, status post renal transplant 1995, refractory anemia (on Procrit and iron infusion as an outpatient), fibromyalgia.     Consultants:   Nephrology  Vascular surgery ID IR   Procedures:   March 30: 2-D echocardiogram: Left ventricular ejection fraction 55-60%. Normal wall motion.  March 29: Transfused 2 units packed blood cells.  April 4: Right arm and central venogram  April 5: Creation of right brachial artery to cephalic vein AV fistula  April 5: Transfusion one unit packed red blood cells.  April 8: Right IJ hemodialysis catheter. April 12 CT guided Node biopsy by IR April 16 - discussed the biopsy results with pathologist on-call  doctor Shur who recommended that there is clinical uncertainty with the present specimen, general surgery called  for excision biopsy.      Low-grade Fevers: Resolved since admission until recurrence April 8 in the evening. Etiology unclear. On initial presentation it was presumed that his fever was secondary to viral pneumonitis. Influenza was negative. No fever recurred until April 8.  Appreciate Dr. Tommy Medal infectious disease input, CT A&P suggests ? Lymphoma vs reactive lymphadenopathy due to immunosuppression from transplant medications, post CT guided biopsy, discussed the biopsy results with pathologist on-call doctor Shur on April 16 who recommended that there is clinical uncertainty with the present specimen, general surgery called for excision biopsy.    Other medical problems   1.  Acute/chronic renal failure: Hemodialysis initiated April 8. Status post AV fistula April 5. Renal following. Now getting 3 times a week dialysis Tuesday Thursday and Saturday schedule, has failed renal transplant.     2. Hyperkalemia: Resolved. Secondary to acute renal failure. Not on potassium supplementation, heparin products or ACE-I/ARB.      4. Refractory anemia: Stable. Fecal occult blood negative. Status post total of 5 units packed red blood cells since admission. Currently H&H appears to be stable we'll continue to monitor.     5. Diabetes mellitus: Stable. Adjusted Lantus to twice a day dose with good effect and ISS, closely monitor PM CBGs  CBG (last 3)   Basename 08/09/11 0809 08/08/11 2115 08/08/11 1717  GLUCAP 280* 185* 96       6. History of renal transplantation with allograft nephropathy (failed): Per nephrology. Continue cyclosporin and prednisone needs outpt taper. Now T,TH,Sat dialysis schedule-[facility].      7. Obstructive sleep apnea: Patient refuses CPAP.On and off confused in am likely retains, have counseled to start wearing it, will order QHS CPAP. Which patient continues not to wear.      8. Mild delirium - waxing and waning, as in #8, patient refused CT  brain, stable UA, CXR. + QHS CPAP encouraged again. Reduced narcotics and benzos 07-07-11. Delirium appears to be improving post medication adjustment.      DVT Prophylaxis   SCDs     [name] M.D on 08/09/2011 at 2:50 PM  [facility] Office  [phone]  endocrine Monday

## 2011-08-10 ENCOUNTER — Other Ambulatory Visit: Payer: Self-pay | Admitting: Oncology

## 2011-08-10 ENCOUNTER — Encounter (HOSPITAL_COMMUNITY): Payer: Self-pay | Admitting: Anesthesiology

## 2011-08-10 ENCOUNTER — Encounter (HOSPITAL_COMMUNITY)
Admission: EM | Disposition: A | Discharge status: Home Health | Payer: Self-pay | Source: Home / Self Care | Attending: Internal Medicine

## 2011-08-10 ENCOUNTER — Inpatient Hospital Stay (HOSPITAL_COMMUNITY): Payer: Medicare Other | Admitting: Anesthesiology

## 2011-08-10 DIAGNOSIS — R599 Enlarged lymph nodes, unspecified: Secondary | ICD-10-CM

## 2011-08-10 HISTORY — PX: LYMPH NODE BIOPSY: SHX201

## 2011-08-10 LAB — GLUCOSE, CAPILLARY
Glucose-Capillary: 308 mg/dL — ABNORMAL HIGH (ref 70–99)
Glucose-Capillary: 389 mg/dL — ABNORMAL HIGH (ref 70–99)
Glucose-Capillary: 412 mg/dL — ABNORMAL HIGH (ref 70–99)
Glucose-Capillary: 422 mg/dL — ABNORMAL HIGH (ref 70–99)
Glucose-Capillary: 401 mg/dL — ABNORMAL HIGH (ref 70–99)
Glucose-Capillary: 223 mg/dL — ABNORMAL HIGH (ref 70–99)
Glucose-Capillary: 205 mg/dL — ABNORMAL HIGH (ref 70–99)
Glucose-Capillary: 139 mg/dL — ABNORMAL HIGH (ref 70–99)
Glucose-Capillary: 221 mg/dL — ABNORMAL HIGH (ref 70–99)

## 2011-08-10 LAB — RENAL FUNCTION PANEL
Albumin: 1.3 g/dL — ABNORMAL LOW (ref 3.5–5.2)
BUN: 31 mg/dL — ABNORMAL HIGH (ref 6–23)
CO2: 28 mEq/L (ref 19–32)
Calcium: 8.5 mg/dL (ref 8.4–10.5)
Chloride: 95 mEq/L — ABNORMAL LOW (ref 96–112)
Creatinine, Ser: 3.09 mg/dL — ABNORMAL HIGH (ref 0.50–1.35)
GFR calc Af Amer: 26 mL/min — ABNORMAL LOW (ref >90)
GFR calc non Af Amer: 23 mL/min — ABNORMAL LOW (ref >90)
Glucose, Bld: 261 mg/dL — ABNORMAL HIGH (ref 70–99)
Phosphorus: 4.2 mg/dL (ref 2.3–4.6)
Potassium: 3.8 mEq/L (ref 3.5–5.1)
Sodium: 131 mEq/L — ABNORMAL LOW (ref 135–145)

## 2011-08-10 LAB — CBC
HCT: 30.4 % — ABNORMAL LOW (ref 39.0–52.0)
Hemoglobin: 9.1 g/dL — ABNORMAL LOW (ref 13.0–17.0)
MCH: 26.9 pg (ref 26.0–34.0)
MCHC: 29.9 g/dL — ABNORMAL LOW (ref 30.0–36.0)
MCV: 89.9 fL (ref 78.0–100.0)
Platelets: 316 10*3/uL (ref 150–400)
RBC: 3.38 MIL/uL — ABNORMAL LOW (ref 4.22–5.81)
RDW: 18.1 % — ABNORMAL HIGH (ref 11.5–15.5)
WBC: 5.1 10*3/uL (ref 4.0–10.5)

## 2011-08-10 SURGERY — LYMPH NODE BIOPSY
Anesthesia: General | Site: Groin | Laterality: Left | Wound class: Clean

## 2011-08-10 MED ORDER — SUCCINYLCHOLINE CHLORIDE 20 MG/ML IJ SOLN
INTRAMUSCULAR | Status: DC | PRN
  Administered 2011-08-10: 100 mg via INTRAVENOUS

## 2011-08-10 MED ORDER — HEPARIN SODIUM (PORCINE) 1000 UNIT/ML DIALYSIS
20.0000 [IU]/kg | INTRAMUSCULAR | Status: DC | PRN
  Administered 2011-08-11: 2300 [IU] via INTRAVENOUS_CENTRAL
  Filled 2011-08-10: qty 3

## 2011-08-10 MED ORDER — PHENYLEPHRINE HCL 10 MG/ML IJ SOLN
INTRAMUSCULAR | Status: DC | PRN
  Administered 2011-08-10 (×2): 40 ug via INTRAVENOUS
  Administered 2011-08-10: 80 ug via INTRAVENOUS

## 2011-08-10 MED ORDER — INSULIN ASPART 100 UNIT/ML ~~LOC~~ SOLN
0.0000 [IU] | Freq: Three times a day (TID) | SUBCUTANEOUS | Status: DC
Start: 2011-08-10 — End: 2011-08-10

## 2011-08-10 MED ORDER — ONDANSETRON HCL 4 MG/2ML IJ SOLN
INTRAMUSCULAR | Status: DC | PRN
  Administered 2011-08-10: 4 mg via INTRAVENOUS

## 2011-08-10 MED ORDER — PROPOFOL 10 MG/ML IV EMUL
INTRAVENOUS | Status: DC | PRN
  Administered 2011-08-10: 150 mg via INTRAVENOUS

## 2011-08-10 MED ORDER — INSULIN ASPART 100 UNIT/ML ~~LOC~~ SOLN
4.0000 [IU] | Freq: Once | SUBCUTANEOUS | Status: AC
  Administered 2011-08-10: 4 [IU] via SUBCUTANEOUS

## 2011-08-10 MED ORDER — INSULIN ASPART 100 UNIT/ML ~~LOC~~ SOLN
3.0000 [IU] | Freq: Three times a day (TID) | SUBCUTANEOUS | Status: DC
  Administered 2011-08-10 – 2011-08-12 (×5): 3 [IU] via SUBCUTANEOUS

## 2011-08-10 MED ORDER — INSULIN ASPART 100 UNIT/ML ~~LOC~~ SOLN
0.0000 [IU] | SUBCUTANEOUS | Status: DC
  Administered 2011-08-10: 9 [IU] via SUBCUTANEOUS

## 2011-08-10 MED ORDER — INSULIN GLARGINE 100 UNIT/ML ~~LOC~~ SOLN
12.0000 [IU] | Freq: Two times a day (BID) | SUBCUTANEOUS | Status: DC
  Administered 2011-08-10 – 2011-08-13 (×6): 12 [IU] via SUBCUTANEOUS

## 2011-08-10 MED ORDER — INSULIN ASPART 100 UNIT/ML ~~LOC~~ SOLN
0.0000 [IU] | Freq: Three times a day (TID) | SUBCUTANEOUS | Status: DC
  Administered 2011-08-10: 3 [IU] via SUBCUTANEOUS
  Administered 2011-08-11: 1 [IU] via SUBCUTANEOUS
  Administered 2011-08-11: 2 [IU] via SUBCUTANEOUS
  Administered 2011-08-12: 3 [IU] via SUBCUTANEOUS
  Administered 2011-08-12: 2 [IU] via SUBCUTANEOUS
  Administered 2011-08-12: 3 [IU] via SUBCUTANEOUS

## 2011-08-10 MED ORDER — EPHEDRINE SULFATE 50 MG/ML IJ SOLN
INTRAMUSCULAR | Status: DC | PRN
  Administered 2011-08-10: 5 mg via INTRAVENOUS

## 2011-08-10 MED ORDER — ONDANSETRON HCL 4 MG/2ML IJ SOLN
4.0000 mg | Freq: Four times a day (QID) | INTRAMUSCULAR | Status: AC | PRN
  Administered 2011-08-11: 4 mg via INTRAVENOUS

## 2011-08-10 MED ORDER — DIPHENHYDRAMINE HCL 50 MG/ML IJ SOLN
12.5000 mg | Freq: Once | INTRAMUSCULAR | Status: AC
  Administered 2011-08-10: 12.5 mg via INTRAVENOUS
  Filled 2011-08-10: qty 1

## 2011-08-10 MED ORDER — HYDROMORPHONE HCL PF 1 MG/ML IJ SOLN
0.5000 mg | INTRAMUSCULAR | Status: DC | PRN
  Administered 2011-08-10 – 2011-08-13 (×2): 0.5 mg via INTRAVENOUS
  Filled 2011-08-10: qty 1

## 2011-08-10 MED ORDER — 0.9 % SODIUM CHLORIDE (POUR BTL) OPTIME
TOPICAL | Status: DC | PRN
  Administered 2011-08-10: 1000 mL

## 2011-08-10 MED ORDER — HYDROMORPHONE HCL PF 1 MG/ML IJ SOLN: 0.2500 mg | INTRAMUSCULAR | Status: DC | PRN

## 2011-08-10 MED ORDER — SODIUM CHLORIDE 0.9 % IV SOLN
INTRAVENOUS | Status: DC | PRN
  Administered 2011-08-10 (×2): via INTRAVENOUS

## 2011-08-10 MED ORDER — LIDOCAINE HCL (CARDIAC) 20 MG/ML IV SOLN
INTRAVENOUS | Status: DC | PRN
  Administered 2011-08-10: 50 mg via INTRAVENOUS

## 2011-08-10 MED ORDER — INSULIN ASPART 100 UNIT/ML ~~LOC~~ SOLN
0.0000 [IU] | Freq: Every day | SUBCUTANEOUS | Status: DC
  Administered 2011-08-10 – 2011-08-11 (×2): 3 [IU] via SUBCUTANEOUS

## 2011-08-10 MED ORDER — BUPIVACAINE-EPINEPHRINE 0.5% -1:200000 IJ SOLN
INTRAMUSCULAR | Status: DC | PRN
  Administered 2011-08-10: 10 mL

## 2011-08-10 MED ORDER — FENTANYL CITRATE 0.05 MG/ML IJ SOLN
INTRAMUSCULAR | Status: DC | PRN
  Administered 2011-08-10: 50 ug via INTRAVENOUS

## 2011-08-10 SURGICAL SUPPLY — 43 items
ADH SKN CLS APL DERMABOND .7 (GAUZE/BANDAGES/DRESSINGS) ×1
BLADE SURG 15 STRL LF DISP TIS (BLADE) ×1 IMPLANT
BLADE SURG 15 STRL SS (BLADE) ×2
CANISTER SUCTION 2500CC (MISCELLANEOUS) IMPLANT
CHLORAPREP W/TINT 10.5 ML (MISCELLANEOUS) ×2 IMPLANT
CLOTH BEACON ORANGE TIMEOUT ST (SAFETY) ×2 IMPLANT
CONT SPEC 4OZ CLIKSEAL STRL BL (MISCELLANEOUS) ×2 IMPLANT
COVER SURGICAL LIGHT HANDLE (MISCELLANEOUS) ×2 IMPLANT
DECANTER SPIKE VIAL GLASS SM (MISCELLANEOUS) ×2 IMPLANT
DERMABOND ADVANCED (GAUZE/BANDAGES/DRESSINGS) ×1
DERMABOND ADVANCED .7 DNX12 (GAUZE/BANDAGES/DRESSINGS) ×1 IMPLANT
DRAPE PED LAPAROTOMY (DRAPES) ×2 IMPLANT
DRAPE UTILITY 15X26 W/TAPE STR (DRAPE) ×2 IMPLANT
ELECT CAUTERY BLADE 6.4 (BLADE) ×2 IMPLANT
ELECT REM PT RETURN 9FT ADLT (ELECTROSURGICAL) ×2
ELECTRODE REM PT RTRN 9FT ADLT (ELECTROSURGICAL) ×1 IMPLANT
GAUZE SPONGE 4X4 16PLY XRAY LF (GAUZE/BANDAGES/DRESSINGS) ×2 IMPLANT
GLOVE BIO SURGEON STRL SZ7 (GLOVE) ×1 IMPLANT
GLOVE BIO SURGEON STRL SZ7.5 (GLOVE) ×4 IMPLANT
GLOVE BIOGEL PI IND STRL 7.0 (GLOVE) IMPLANT
GLOVE BIOGEL PI INDICATOR 7.0 (GLOVE) ×1
GLOVE EXAM NITIRLE LRG BLUE (GLOVE) ×1 IMPLANT
GOWN STRL NON-REIN LRG LVL3 (GOWN DISPOSABLE) ×4 IMPLANT
KIT BASIN OR (CUSTOM PROCEDURE TRAY) ×2 IMPLANT
KIT ROOM TURNOVER OR (KITS) ×2 IMPLANT
NDL HYPO 25GX1X1/2 BEV (NEEDLE) ×1 IMPLANT
NEEDLE HYPO 25GX1X1/2 BEV (NEEDLE) ×2 IMPLANT
NS IRRIG 1000ML POUR BTL (IV SOLUTION) ×2 IMPLANT
PACK SURGICAL SETUP 50X90 (CUSTOM PROCEDURE TRAY) ×2 IMPLANT
PAD ARMBOARD 7.5X6 YLW CONV (MISCELLANEOUS) ×2 IMPLANT
PENCIL BUTTON HOLSTER BLD 10FT (ELECTRODE) ×2 IMPLANT
SPONGE LAP 18X18 X RAY DECT (DISPOSABLE) ×1 IMPLANT
STAPLER VISISTAT 35W (STAPLE) ×1 IMPLANT
SUT MNCRL AB 4-0 PS2 18 (SUTURE) ×2 IMPLANT
SUT VIC AB 3-0 SH 27 (SUTURE) ×2
SUT VIC AB 3-0 SH 27X BRD (SUTURE) ×1 IMPLANT
SUT VICRYL AB 2 0 TIES (SUTURE) ×1 IMPLANT
SYR BULB IRRIGATION 50ML (SYRINGE) ×1 IMPLANT
SYR CONTROL 10ML LL (SYRINGE) ×2 IMPLANT
TOWEL OR 17X24 6PK STRL BLUE (TOWEL DISPOSABLE) ×2 IMPLANT
TOWEL OR 17X26 10 PK STRL BLUE (TOWEL DISPOSABLE) ×2 IMPLANT
TUBE CONNECTING 12X1/4 (SUCTIONS) IMPLANT
YANKAUER SUCT BULB TIP NO VENT (SUCTIONS) IMPLANT

## 2011-08-10 NOTE — Progress Notes (Signed)
Results for Dale [name], Dale [name] (MRN [id number]) as of 08/10/2011 10:46  Ref. Range 08/09/2011 08:09 08/09/2011 17:19 08/09/2011 20:56 08/09/2011 21:13 08/09/2011 21:22 08/09/2011 21:57 08/09/2011 22:16 08/09/2011 23:10  Glucose-Capillary Latest Range: 70-99 mg/dL 280 (H) 186 (H) 39 (LL) 42 (LL) 108 (H) 65 (L) 147 (H) 105 (H)    Results for Dale [name], Dale [name] (MRN [id number]) as of 08/10/2011 10:46  Ref. Range 08/10/2011 01:18 08/10/2011 03:29 08/10/2011 04:48 08/10/2011 05:08 08/10/2011 06:15 08/10/2011 08:52 08/10/2011 10:27  Glucose-Capillary Latest Range: 70-99 mg/dL 308 (H) 389 (H) 412 (H) 422 (H) 401 (H) 223 (H) 205 (H)   Patient did not get Lantus 15 units yesterday morning b/c he was off floor per RN documenation.   At 5pm, his CBG was 186 mg/dl.  Per orders, he received 3 units Novolog correction (SSI) and 8 units Novolog meal coverage for his food.  By 9pm, patient's CBG was 39 mg/dl.  Was treated per Hypoglycemia protocol.  CBG at 10 pm was slightly low at 65 mg/dl.  Patient was again treated per Hypoglycemia protocol.  CBG had recovered nicely at 11pm to 105 mg/dl.  Lantus dose was d/c'd and Novolog meal coverage was also d/c'd.  As a result, patient's CBG was 422 mg/dl by 5am.   In summary, patient got no Lantus yesterday and is now being covered by Novolog Sensitive SSI Q2 hours which is dangerous for patients with renal disease b/c the insulin can stack up and cause further low CBGs.  Recommend the following:  1. Please add Lantus back.  Can start at a lower dose- Lantus 12 units bid and titrate upward as needed. 2. Change Novolog SSI back to Sensitive tid ac + HS 3. Add back meal coverage at a lower dose- Novolog 5 units tid with meals  Will follow. Wyn Quaker RN, MSN, CDE Diabetes Coordinator Inpatient Diabetes Program [phone]

## 2011-08-10 NOTE — Progress Notes (Signed)
Called to further assess patient with multiple symptomatic hypoglycemic events. Upon arrival patient's CBG was 149 and was asymptomatic. Advised bedside RN to call the MD to see if he wanted to get labs or start MIVF. MD wanted to continuing monitoring blood sugar every 2 hours and continue to treat with hypoglycemic protocol. Advised bedside RN to call with further concerns, will continue to monitor

## 2011-08-10 NOTE — Op Note (Signed)
07/22/2011 - 08/10/2011  12:29 PM  PATIENT:  Dale [name]  46 y.o. male  PRE-OPERATIVE DIAGNOSIS:  lymphadenopathy  POST-OPERATIVE DIAGNOSIS:  lymphadenopathy  PROCEDURE:  Procedure(s) (LRB): LYMPH NODE BIOPSY (Left)  SURGEON:  Surgeon(s) and Role:    * Merrie Roof, MD - Primary  PHYSICIAN ASSISTANT:   ASSISTANTS: none   ANESTHESIA:   general  EBL:  Total I/O In: 200 [I.V.:200] Out: 10 [Blood:10]  BLOOD ADMINISTERED:none  DRAINS: none   LOCAL MEDICATIONS USED:  MARCAINE     SPECIMEN:  Source of Specimen:  left groin lymph node  DISPOSITION OF SPECIMEN:  PATHOLOGY  COUNTS:  YES  TOURNIQUET:  * No tourniquets in log *  DICTATION: .Dragon Dictation After informed consent was obtained the patient was brought to the operating room and placed in the supine position on the operating room table. After adequate induction of general anesthesia the patient's left groin was prepped with Betadine and draped in the usual sterile manner. There was a large palpable lymph node in the left groin. A small vertically oriented incision was made with a 15 blade knife overlying the lymph node. This incision was carried down through the skin and subcutaneous tissue sharply with the electrocautery until the lymph node was identified. The lymph node was dissected free initially with some Bovie electrocautery. The medial edge of the lymph node was then clamped with hemostats divided and ligated with 3-0 Vicryl ties. The bulk of the lymph node was removed. The lymph node was sent to pathology for lymphoma protocol. The wound was irrigated with copious amounts of saline and infiltrated with quarter percent Marcaine. The deep layer the wound was then closed with interrupted 3-0 Vicryl stitches. The skin was then closed with interrupted 4-0 Monocryl subcuticular stitches. Dermabond dressings were applied. The patient tolerated the procedure well. At the end of the case all needle sponge and instrument  counts were correct. The patient was then awakened and taken to recovery in stable condition.  PLAN OF CARE: Admit to inpatient   PATIENT DISPOSITION:  PACU - hemodynamically stable.   Delay start of Pharmacological VTE agent (>24hrs) due to surgical blood loss or risk of bleeding: no

## 2011-08-10 NOTE — Progress Notes (Signed)
CSW met with pt and pt wife to discuss SNF placement. Answered questions and provided preliminary offers. MD please advise of anticipated d/c date, as pt has [facility], which requires preauthorization.  Jerral Ralph, MSW [phone]

## 2011-08-10 NOTE — Progress Notes (Signed)
Pt. Called feeling weak and very diaphoretic, cold clammy sweat, blood sugar check was 39, given several juices and some crackers as well glucose gel, refused the gel prefer tab., given 3 glucose tab. Rechecked BS up to only 42 and pt. Cont. Sweat and very weak looking. 1/2 amp D50 IV slow push was given., blood sugar up to 108. Dr Deterding made aware of pts. Symptoms and blood sugar with order to tx. Hypoglycemia  And blood sugar check every 1 hourx3 and every 4 hours. Pts. vital signs stable at present. Will cont to monitor. Pt. was feeling better after.

## 2011-08-10 NOTE — Progress Notes (Signed)
PT has refused CPAP. PT states that he is still not used to the machine so he wants to wait until he has been discharged to use the CPAP at home. Rt will continue to monitor.

## 2011-08-10 NOTE — Progress Notes (Signed)
Pts. Blood sugar was down to 65 and pt. Still complaining of feeling weak again more juice was given and 1/2 amp of D50 IV given slow push, pt. was restarted another IV access.  RRT was paged and was given heads up about patient condition with suggestions made. Dr. Jimmy Footman made aware of pts. BS dropping again and up to 147 after amp. of D50, informed also about pt. Npo after midnight, with order just to monitor bs every 2 hours and cont. giving D50 IV per protocol.

## 2011-08-10 NOTE — Progress Notes (Signed)
Patient ID: Dale [name], male   DOB: 1965-07-26, 46 y.o.   MRN: [id number]  [facility] Progress Note    Subjective:   Feels ok   Objective:   BP 99/50  Pulse 84  Temp(Src) 97.9 F (36.6 C) (Oral)  Resp 17  Ht 5\' 8"  (1.727 m)  Wt 117 kg (257 lb 15 oz)  BMI 39.22 kg/m2  SpO2 99%  Physical Exam: Gen:WD WN obese WM in NAD CVS:no rub Resp:CTA [id number] edema, s/p left BKA, RUE AVG +T/B  Labs: BMET  Lab 08/10/11 0803 08/09/11 0839 08/06/11 0600 08/04/11 1020  NA 131* 128* 129* 131*  K 3.8 4.2 4.3 3.5  CL 95* 91* 93* 94*  CO2 28 23 22 23   GLUCOSE 261* 289* 250* 51*  BUN 31* 66* 68* 84*  CREATININE 3.09* 3.63* 4.81* 5.40*  ALB -- -- -- --  CALCIUM 8.5 8.7 8.7 8.7  PHOS 4.2 4.9* 6.0* 8.0*   CBC  Lab 08/10/11 0803 08/09/11 0839 08/06/11 0600 08/04/11 1020  WBC 5.1 8.0 9.5 11.9*  NEUTROABS -- -- -- --  HGB 9.1* 8.9* 8.9* 7.0*  HCT 30.4* 29.4* 29.1* 23.3*  MCV 89.9 87.0 86.9 87.6  PLT 316 381 309 349    @IMGRELPRIORS @ Medications:      . benzonatate  100 mg Oral TID  . calcium acetate  1,334 mg Oral TID WC  . cycloSPORINE modified  100 mg Oral BID  . darbepoetin (ARANESP) injection - DIALYSIS  150 mcg Intravenous Q Tue-HD  . dextrose      . dextrose      . diphenhydrAMINE  25 mg Oral QHS,MR X 1  . glucose-Vitamin C      . insulin aspart  0-9 Units Subcutaneous Q2H  . insulin aspart  4 Units Subcutaneous Once  . isosorbide mononitrate  60 mg Oral Daily  . labetalol  100 mg Oral BID  . levothyroxine  75 mcg Oral QAC breakfast  . metoCLOPramide  5 mg Oral TID PC & HS  . multivitamin  1 tablet Oral Daily  . pantoprazole  40 mg Oral Q1200  . paricalcitol  2 mcg Intravenous Q T,Th,Sa-HD  . predniSONE  5 mg Oral Q breakfast  . rOPINIRole  1 mg Oral QHS  . sodium chloride  3 mL Intravenous Q12H  . vancomycin  1,000 mg Intravenous Once  . DISCONTD: insulin aspart  0-15 Units Subcutaneous TID WC  . DISCONTD: insulin aspart  0-9  Units Subcutaneous TID WC  . DISCONTD: insulin aspart  8 Units Subcutaneous TID WC  . DISCONTD: insulin glargine  15 Units Subcutaneous BID     Assessment/ Plan:   Assessment/Plan:  1. ESRD - New start to HD after failed renal transplant, restarted on 4/8, on TTS using right IJ catheter placed on 4/8; on cyclosporine & prednisone. Next HD pending today.  2. HTN/Volume - BP fairly well controlled (most recently 144/62), on Labetalol 100 mg bid; wt up to 116.4 kg today. UF goal of 3-4 L today.  3. Anemia - Hgb up to 8.9, Fe sat 11%, but Ferritin 2190; on Aranesp 150 mcg on Tues.  4. Secondary Hyperparathyroidism - Ca 8.7 (10.2 corrected), P 6, iPTH 172.4, on Phoslo 2 with meals, Zemplar 2 mcg per HD.  5. Fever/LAN - CT scan showed new retroperitoneal and mesenteric lymphadenopathy. LN Bx done by IR today  6. Disposition - short-term rehab at [facility] pending, but awaiting setup for outpt HD.  [name], MD 08/10/2011, 2:53 PM

## 2011-08-10 NOTE — Progress Notes (Signed)
Utilization Review Completed.Donne Anon T4/17/2013

## 2011-08-10 NOTE — Progress Notes (Signed)
Pts. blood sugar was 401 after 4 units of novolog, Dr Deterding was paged to clarify sliding scale coverage per protocol if >400, no need for lab. draw just cover with sliding scale every 2 hours as ordered. Cont. to monitor.

## 2011-08-10 NOTE — Progress Notes (Signed)
OT Cancellation Note  Treatment cancelled today due to patient receiving procedure or test - biopsy.  Will re-attempt 08/11/11     [name] M [id number] 08/10/2011, 2:16 PM

## 2011-08-10 NOTE — Progress Notes (Addendum)
Pt. Develops a red rash on arms,head,face and back after he come bach from PACU.He verbalized been litchi and benadryl was given.The rash wasn't present at that time.Md has been notified.keep monitoring pt. Closely.Orders were received for 12.5 mg IV benadryl.VS are stable.keep monitoring pt. closely

## 2011-08-10 NOTE — Progress Notes (Signed)
Patient ID: Dale [name], male   DOB: 1965-10-19, 46 y.o.   MRN: [id number] 9 Days Post-Op  Subjective: Pt feels ok today.  No complaints.  Objective: Vital signs in last 24 hours: Temp:  [97.1 F (36.2 C)-98.1 F (36.7 C)] 98.1 F (36.7 C) (04/17 0512) Pulse Rate:  [80-97] 81  (04/17 0512) Resp:  [14-22] 19  (04/17 0512) BP: (106-159)/(46-83) 106/46 mmHg (04/17 0512) SpO2:  [96 %-100 %] 97 % (04/17 0512) FiO2 (%):  [2 %] 2 % (04/16 2108) Weight:  [256 lb 9.9 oz (116.4 kg)-257 lb 15 oz (117 kg)] 257 lb 15 oz (117 kg) (04/17 0512) Last BM Date: 08/09/11  Intake/Output from previous day: 04/16 0701 - 04/17 0700 In: 1320 [P.O.:1320] Out: 1409 [Urine:1; Stool:1] Intake/Output this shift:    PE: Abd: soft, NT  Lab Results:   [facility] 08/09/11 0839  WBC 8.0  HGB 8.9*  HCT 29.4*  PLT 381   BMET  Basename 08/09/11 0839  NA 128*  K 4.2  CL 91*  CO2 23  GLUCOSE 289*  BUN 66*  CREATININE 3.63*  CALCIUM 8.7   PT/INR No results found for this basename: LABPROT:2,INR:2 in the last 72 hours CMP     Component Value Date/Time   NA 128* 08/09/2011 0839   K 4.2 08/09/2011 0839   CL 91* 08/09/2011 0839   CO2 23 08/09/2011 0839   GLUCOSE 289* 08/09/2011 0839   BUN 66* 08/09/2011 0839   CREATININE 3.63* 08/09/2011 0839   CALCIUM 8.7 08/09/2011 0839   PROT 7.4 07/22/2011 1457   ALBUMIN 1.4* 08/09/2011 0839   AST 51* 07/22/2011 1457   ALT 57* 07/22/2011 1457   ALKPHOS 549* 07/22/2011 1457   BILITOT 0.6 07/22/2011 1457   GFRNONAA 19* 08/09/2011 0839   GFRAA 22* 08/09/2011 0839   Lipase  No results found for this basename: lipase       Studies/Results: No results found.  Anti-infectives: Anti-infectives     Start     Dose/Rate Route Frequency Ordered Stop   08/10/11 0000   vancomycin (VANCOCIN) IVPB 1000 mg/200 mL premix     Comments: On call to OR 08-10-11      1,000 mg 200 mL/hr over 60 Minutes Intravenous  Once 08/09/11 1519     07/31/11 1358   vancomycin (VANCOCIN) IVPB  1000 mg/200 mL premix  Status:  Discontinued        1,000 mg 200 mL/hr over 60 Minutes Intravenous 60 min pre-op 07/31/11 1358 08/01/11 1555           Assessment/Plan  1. Lymphadenopathy  Plan: 1. To OR today for LN biopsy.  I have d/w the patient.  He understands and is agreeable.  I discussed the risks such as infection and bleeding with him.  I also explained the procedure.   LOS: 19 days    Nitzia Perren E 08/10/2011

## 2011-08-10 NOTE — Progress Notes (Signed)
Pts. Blood sugar was >300 x2 every 2 hours, pt complained of dry mouth, concern about blood sugar climbing up without isulin coverage. Dr. Jimmy Footman made aware with orders to just keep on monitoring it since it's fluctuating.

## 2011-08-10 NOTE — Progress Notes (Signed)
Dale [name] is a 45 y.o. male,  Outpatient Primary MD for the Dale [name] is No primary provider on file.  Chief Complaint  Dale [name] presents with  . Weakness  . Shortness of Breath        Subjective:    Dale [name] has, No headache, No chest pain, No abdominal pain - No Nausea, No new weakness tingling or numbness, no Cough - No SOB. Just returned from PACU and had procedure done. Somewhat sleepy has had some itchiness and just received Benadryl   Objective:   Filed Vitals:   08/09/11 2108 08/10/11 0512 08/10/11 1027 08/10/11 1237  BP: 129/58 106/46 105/55 124/55  Pulse: 97 81 78 91  Temp:  98.1 F (36.7 C) 98.1 F (36.7 C) 98 F (36.7 C)  TempSrc:  Oral Oral   Resp: 20 19 20  0  Height:  5\' 8"  (1.727 m)    Weight:  117 kg (257 lb 15 oz)    SpO2: 99% 97% 100% 95%    Wt Readings from Last 3 Encounters:  08/10/11 117 kg (257 lb 15 oz)  08/10/11 117 kg (257 lb 15 oz)  08/10/11 117 kg (257 lb 15 oz)     Intake/Output Summary (Last 24 hours) at 08/10/11 1309 Last data filed at 08/10/11 1215  Gross per 24 hour  Intake   1160 ml  Output     11 ml  Net   1149 ml    Exam Awake alert No new F.N deficits, Normal affect [facility].AT,PERRAL Supple Neck,No JVD, No cervical lymphadenopathy appriciated.  Symmetrical Chest wall movement, Good air movement bilaterally, CTAB RRR,No Gallops,Rubs or new Murmurs, No Parasternal Heave +ve B.Sounds, Abd Soft, Non tender, No organomegaly appriciated, No rebound -guarding or rigidity. 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-clean wound   Data Review  CBC  Lab 08/10/11 0803 08/09/11 0839 08/06/11 0600 08/04/11 1020  WBC 5.1 8.0 9.5 11.9*  HGB 9.1* 8.9* 8.9* 7.0*  HCT 30.4* 29.4* 29.1* 23.3*  PLT 316 381 309 349  MCV 89.9 87.0 86.9 87.6  MCH 26.9 26.3 26.6 26.3  MCHC 29.9* 30.3 30.6 30.0  RDW 18.1* 18.0* 18.8* 19.1*  LYMPHSABS -- -- -- --  MONOABS -- -- -- --  EOSABS -- -- -- --  BASOSABS -- -- -- --  BANDABS -- --  -- --    Chemistries   Lab 08/10/11 0803 08/09/11 0839 08/06/11 0600 08/04/11 1020  NA 131* 128* 129* 131*  K 3.8 4.2 4.3 3.5  CL 95* 91* 93* 94*  CO2 28 23 22 23   GLUCOSE 261* 289* 250* 51*  BUN 31* 66* 68* 84*  CREATININE 3.09* 3.63* 4.81* 5.40*  CALCIUM 8.5 8.7 8.7 8.7  MG -- -- -- --  AST -- -- -- --  ALT -- -- -- --  ALKPHOS -- -- -- --  BILITOT -- -- -- --   ------------------------------------------------------------------------------------------------------------------ estimated creatinine clearance is 37.5 ml/min (by C-G formula based on Cr of 3.09). ------------------------------------------------------------------------------------------------------------------ No results found for this basename: HGBA1C:2 in the last 72 hours ------------------------------------------------------------------------------------------------------------------ No results found for this basename: CHOL:2,HDL:2,LDLCALC:2,TRIG:2,CHOLHDL:2,LDLDIRECT:2 in the last 72 hours ------------------------------------------------------------------------------------------------------------------ No results found for this basename: TSH,T4TOTAL,FREET3,T3FREE,THYROIDAB in the last 72 hours ------------------------------------------------------------------------------------------------------------------ No results found for this basename: VITAMINB12:2,FOLATE:2,FERRITIN:2,TIBC:2,IRON:2,RETICCTPCT:2 in the last 72 hours  Coagulation profile  Lab 08/05/11 1109  INR 1.63*  PROTIME --    No results found for this basename: DDIMER:2 in the last 72 hours  Cardiac Enzymes No results found for this  basename: CK:3,CKMB:3,TROPONINI:3,MYOGLOBIN:3 in the last 168 hours ------------------------------------------------------------------------------------------------------------------ No components found with this basename: POCBNP:3  Micro Results Recent Results (from the past 240 hour(s))  CULTURE, BLOOD (ROUTINE X  2)     Status: Normal   Collection Time   08/02/11  3:52 PM      Component Value Range Status Comment   Specimen Description BLOOD HEMODIALYSIS CATHETER   Final    Special Requests     Final    Value: BOTTLES DRAWN AEROBIC AND ANAEROBIC 10CC RIGHT IJ CATH   Culture  Setup Time [id number]   Final    Culture NO GROWTH 5 DAYS   Final    Report Status 08/09/2011 FINAL   Final   CULTURE, BLOOD (ROUTINE X 2)     Status: Normal   Collection Time   08/02/11  5:40 PM      Component Value Range Status Comment   Specimen Description BLOOD HEMODIALYSIS CATHETER   Final    Special Requests BOTTLES DRAWN AEROBIC AND ANAEROBIC 10CC RIGHT IJ   Final    Culture  Setup Time [id number]   Final    Culture NO GROWTH 5 DAYS   Final    Report Status 08/09/2011 FINAL   Final   URINE CULTURE     Status: Normal   Collection Time   08/04/11  4:11 PM      Component Value Range Status Comment   Specimen Description URINE, RANDOM   Final    Special Requests NONE   Final    Culture  Setup Time [id number]   Final    Colony Count >=100,000 COLONIES/ML   Final    Culture YEAST   Final    Report Status 08/06/2011 FINAL   Final   AFB CULTURE WITH SMEAR     Status: Normal (Preliminary result)   Collection Time   08/05/11  4:52 PM      Component Value Range Status Comment   Specimen Description TISSUE LYMPH NODE   Final    Special Requests NONE   Final    ACID FAST SMEAR NO ACID FAST BACILLI SEEN   Final    Culture     Final    Value: CULTURE WILL BE EXAMINED FOR 6 WEEKS BEFORE ISSUING A FINAL REPORT   Report Status PENDING   Incomplete   FUNGUS CULTURE W SMEAR     Status: Normal (Preliminary result)   Collection Time   08/05/11  4:52 PM      Component Value Range Status Comment   Specimen Description TISSUE LYMPH NODE   Final    Special Requests NONE   Final    Fungal Smear NO YEAST OR FUNGAL ELEMENTS SEEN   Final    Culture CULTURE IN PROGRESS FOR FOUR WEEKS   Final    Report Status PENDING   Incomplete     TISSUE CULTURE     Status: Normal   Collection Time   08/05/11  4:52 PM      Component Value Range Status Comment   Specimen Description TISSUE LYMPH NODE   Final    Special Requests NONE   Final    Gram Stain     Final    Value: NO WBC SEEN     NO SQUAMOUS EPITHELIAL CELLS SEEN     NO ORGANISMS SEEN   Culture NO GROWTH 3 DAYS   Final    Report Status 08/09/2011 FINAL   Final     Radiology Reports Dg  Chest 2 View  08/02/2011  *RADIOLOGY REPORT*  Clinical Data: History of difficulty breathing.  History of fever.  CHEST - 2 VIEW  Comparison: 07/22/2011.  08/01/2011.  Findings: Right sided dialysis catheter has tips in right atrial region.  There is stable cardiac silhouette enlargement.  There is upper lobe vascular prominence with pulmonary venous hypertension vascular congestion pattern.  No consolidation or definite pleural effusion is seen.  There is slight elevation of the margin of the left hemidiaphragm with minimal basilar atelectasis. On the lateral image there appears to be some posterior atelectasis and patchy infiltrative density without consolidation.  There is minimal degenerative spondylosis.  IMPRESSION: Dialysis catheter in place.  Stable cardiac silhouette enlargement. Pulmonary venous hypertension vascular congestion pattern.  No consolidation or pleural effusion. Left basilar and posterior atelectasis and patchy infiltrative densities without consolidation.  Original Report Authenticated By: Delane Ginger, M.D.   Dg Chest 2 View  07/22/2011  *RADIOLOGY REPORT*  Clinical Data: Fever for 1 week with cough, shortness of breath, hypertension and diabetes  CHEST - 2 VIEW  Comparison: 10/19/2004  Findings: Heart and mediastinal contours are stable with an unchanged degree of mild cardiomegaly.  The lung fields are notable for mild pulmonary vascular congestion.  No overt congestive failure, interstitial septal lines or significant peribronchial cuffing is seen to suggest early interstitial  edema. A slight increase in interstitial markings are seen diffusely and may indicate underlying viral pneumonitis.  No focal infiltrates are seen to suggest an area of focal bronchopneumonia.  No pleural fluid is seen.  Bony structures appear intact.  IMPRESSION: Mild pulmonary vascular congestion and mild increase in interstitial markings suspicious for viral pneumonitis given history of fever.  No findings suggestive of bronchopneumonia.  Original Report Authenticated By: Ander Gaster, M.D.   US Abdomen Limited  07/14/2011  *RADIOLOGY REPORT*  Clinical Data:  Elevated liver function tests, history of renal transplantation  LIMITED ABDOMINAL ULTRASOUND - RIGHT UPPER QUADRANT  Comparison:  CT abdomen of 08/26/2004  Findings:  Gallbladder:  The gallbladder is visualized and no gallstones are noted.  There is no pain over the gallbladder with compression.  Common bile duct:  The common bile duct is normal measuring 5.0 mm in diameter.  Liver:  The liver is echogenic consistent with fatty infiltration. Also the liver measures 20.1 cm sagittally indicating hepatomegaly. No ductal dilatation is seen.  IMPRESSION:  1.  Fatty infiltration of the liver.  Hepatomegaly. 2.  No gallstones.                   Original Report Authenticated By: Joretta Bachelor, M.D.   Dg Chest Port 1 View  08/01/2011  *RADIOLOGY REPORT*  Clinical Data: Central line placement.  PORTABLE CHEST - 1 VIEW  Comparison: 07/22/2011.  Findings: 1504 hours.  Right IJ dialysis catheter tips are present within the right atrium.  There is stable cardiomegaly with chronic vascular congestion.  No confluent airspace opacity, significant pleural effusion or pneumothorax is seen.  IMPRESSION: Dialysis catheter placement without demonstrated pneumothorax. Stable cardiomegaly and vascular congestion.  Original Report Authenticated By: Vivia Ewing, M.D.     Scheduled Meds:    . benzonatate  100 mg Oral TID  . calcium acetate  1,334 mg Oral TID  WC  . cycloSPORINE modified  100 mg Oral BID  . darbepoetin (ARANESP) injection - DIALYSIS  150 mcg Intravenous Q Tue-HD  . dextrose      . dextrose      .  diphenhydrAMINE  25 mg Oral QHS,MR X 1  . glucose-Vitamin C      . insulin aspart  0-9 Units Subcutaneous Q2H  . insulin aspart  4 Units Subcutaneous Once  . isosorbide mononitrate  60 mg Oral Daily  . labetalol  100 mg Oral BID  . levothyroxine  75 mcg Oral QAC breakfast  . metoCLOPramide  5 mg Oral TID PC & HS  . multivitamin  1 tablet Oral Daily  . pantoprazole  40 mg Oral Q1200  . paricalcitol  2 mcg Intravenous Q T,Th,Sa-HD  . predniSONE  5 mg Oral Q breakfast  . rOPINIRole  1 mg Oral QHS  . sodium chloride  3 mL Intravenous Q12H  . vancomycin  1,000 mg Intravenous Once  . DISCONTD: insulin aspart  0-15 Units Subcutaneous TID WC  . DISCONTD: insulin aspart  0-9 Units Subcutaneous TID WC  . DISCONTD: insulin aspart  8 Units Subcutaneous TID WC  . DISCONTD: insulin glargine  15 Units Subcutaneous BID   Continuous Infusions:  PRN Meds:.acetaminophen, acetaminophen, albuterol, alum & mag hydroxide-simeth, heparin, heparin, heparin, heparin, HYDROmorphone (DILAUDID) injection, HYDROmorphone, menthol-cetylpyridinium, ondansetron (ZOFRAN) IV, ondansetron, DISCONTD: 0.9 % irrigation (POUR BTL), DISCONTD: bupivacaine-EPINEPHrine  Assessment & Plan   46 year old man presented to short stay for iron infusion. Was found to have a hemoglobin of 6.7 and referred to the emergency department. There is also some concern of fever upon admission thought to be Viral URI at that time, however fever resolved immediately an initial infectious workup was unremarkable. He has Anemia (AOCD) and had PRBC transfusion initially,  he remained hospitalized because of worsening renal failure - when this failed to significantly improved the Dale [name] underwent AV graft placement and temporary dialysis catheter placement and was started on hemodialysis April 8. He  developed fever and April 8 for the first time since admission. ID was consulted and after CT scan of abdomen and pelvis it was found that Dale [name] had diffuse lymphadenopathy suspicious for lymphoma. He should was offered excision biopsy versus CT-guided biopsy by interventional radiology, Dale [name] at that time opted not to have excisional biopsy and first give a trial of CT-guided needle biopsy, he was clearly told that this was not the best method of obtaining samples however he at that time was insistent that CT-guided biopsy be given a shot first, however now his delirium has improved and he is agreeable to excision biopsy if needed in the future. I have talked to pathologist on-call on 08/08/2011 he is reviewing the samples with special staining and has sent samples for flow cytometry also. April 16 I discussed the biopsy results with pathologist on-call doctor Shur who recommended that there is clinical uncertainty with the present specimen, general surgery called for excision biopsy. After excisional biopsy Dale [name] can have outpatient followup with him on, we had [facility], however Dale [name] did not meet criteria for either, social work has been requested to find another facility for the Dale [name] as wife has expressed that she cannot take care of him at home at this point.    Past medical history:   End-stage renal disease secondary to diabetic nephropathy, status post renal transplant 1995, refractory anemia (on Procrit and iron infusion as an outpatient), fibromyalgia.     Consultants:   Nephrology  Vascular surgery ID IR   Procedures:   March 30: 2-D echocardiogram: Left ventricular ejection fraction 55-60%. Normal wall motion.  March 29: Transfused 2 units packed blood cells.  April 4: Right arm and central venogram  April 5: Creation of right brachial artery to cephalic vein AV fistula  April 5: Transfusion one unit packed red blood  cells.  April 8: Right IJ hemodialysis catheter. April 12 CT guided Node biopsy by IR April 16 - discussed the biopsy results with pathologist on-call doctor Shur who recommended that there is clinical uncertainty with the present specimen, general surgery called for excision biopsy. April 17-excisional biopsy done      Low-grade Fevers: Resolved since admission until recurrence April 8 in the evening. Etiology unclear. On initial presentation it was presumed that his fever was secondary to viral pneumonitis. Influenza was negative. No fever recurred until April 8.  Appreciate Dr. Tommy Medal infectious disease input, CT A&P suggests ? Lymphoma vs reactive lymphadenopathy due to immunosuppression from transplant medications, post US guided biopsy-had excisional biopsy to 4/17 awaiting further workup    Other medical problems   1.  Acute/chronic renal failure: Hemodialysis initiated April 8. Status post AV fistula April 5. Renal following. Now getting 3 times a week dialysis Tuesday Thursday and Saturday schedule, has failed renal transplant-was done 18 years ago   2. Hyperkalemia: Resolved. Secondary to acute renal failure. Not on potassium supplementation, heparin products or ACE-I/ARB.    4. Refractory anemia: Stable. Fecal occult blood negative. Status post total of 5 units packed red blood cells since admission. Currently H&H appears to be stable we'll continue to monitor.     5. Diabetes mellitus: Stable. Adjusted Lantus to twice a day dose with good effect and ISS, closely monitor PM CBGs-Dale [name] was discontinued off of insulin yesterday given he had low blood sugar of 39.  We'll start Lantus 12 units twice a day and change no part sensitive 3 times a day a.c. with meals and back off meal-time coverage to 3 units 3 times a day  CBG (last 3)   Basename 08/10/11 1240 08/10/11 1027 08/10/11 0852  GLUCAP 139* 205* 223*    6. History of renal transplantation with allograft  nephropathy (failed): Per nephrology. Continue cyclosporin and prednisone needs outpt taper. Now T,TH,Sat dialysis schedule-[facility].  Unclear etiology of whether this may need further transplant.   7. Obstructive sleep apnea: Dale [name] refuses CPAP.On and off confused in am likely retains, have counseled to start wearing it, will order QHS CPAP. Which Dale [name] continues not to wear.   8. Mild delirium - waxing and waning, as in #8, Dale [name] refused CT brain, stable UA, CXR. + QHS CPAP encouraged again. Reduced narcotics and benzos 07-07-11. Delirium appears to be improving post medication adjustment.      DVT Prophylaxis   SCDs     Delisia Mcquiston,JAI M.D on 08/10/2011 at 1:09 PM  [facility] Office  [phone]  endocrine Monday

## 2011-08-10 NOTE — Progress Notes (Signed)
Physical Therapy Note   08/10/11 1508  PT Visit Information  Reason Eval/Treat Not Completed Other (comment) Martin Majestic for surgical biopsy)  PT - Plan   Will plan to see pt for PT tomorrow, pre or post HD   [place], [place]

## 2011-08-10 NOTE — Progress Notes (Signed)
   CARE MANAGEMENT NOTE 08/10/2011  Patient:  Dale [name], Dale [name]   Account Number:  [id number]  Date Initiated:  07/25/2011  Documentation initiated by:  [name]  Subjective/Objective Assessment:   PT WAS ADMITTED WITH ANEMIA     Action/Plan:   PROGRESSION OF CARE AND DISCHARGE PLANNING   Anticipated DC Date:  08/11/2011   Anticipated DC Plan:  SKILLED NURSING FACILITY  In-house referral  Clinical Social Worker      DC Planning Services  CM consult      Choice offered to / List presented to:             Status of service:  In process, will continue to follow Medicare Important Message given?   (If response is "NO", the following Medicare IM given date fields will be blank) Date Medicare IM given:   Date Additional Medicare IM given:    Discharge Disposition:    Per UR Regulation:  Reviewed for med. necessity/level of care/duration of stay  If discussed at [place] of Stay Meetings, dates discussed:   08/03/2011    Comments:  08/10/11 [name], RN, BSN 1612 SPOKE WITH PT AND HIS [place].  I HAVE EXPLAINED TO THEM THAT HE HAS NO LTACH BENEFITS FOR [facility] AND [facility] HAS DENIED HIM.  PLAN IS FOR SNF WITH OP HD.  CSW IS TO TALK PT AND HIS WIFE ABOUT COPAYS AT SNF.  WILL F/U.  08/09/11 [name], RN, BSN 1112 UR COMPLETED, [place], FAXING PT OUT FOR SNF AS BACK UP, AWAITING RENAL TO GIVE [place] AN OP HD CENTER.  08/08/11 [name], RN, BSN 1536 PT HAS NO LTACH BENEFITS, PT IS OK WITH SNF.  HE WOULD LIKE TO BE FAXED OUT FOR [facility].  INFORMED CSW.  WILL F/U.  08/05/11 [name], RN, BSN 1610 UR COMPLETED.  PT IS TO HAVE A NEEDLE BX TODAY.  PT HAS BEEN DENIED BY [facility] AND [facility] HAS NO HD BEDS AT THIS TIME.  WILL F/U WITH [facility] ON MONDAY.  08/03/11 Veta Dambrosia, RN, BSN 1655 PT'S WIFE HAS STATED THAT SHE CANT TAKE CARE OF HIM AT DC IF HE CONTS TO BE AS WEAK.  PT/OT TO EVAL, CIR WILL LOOK AT  HIM IF IT IS RECOMMENDED.  UR COMPLETED 08/02/11 [name], RN, BNS 1239  UR COMPLETED 07/29/11 [name], RN, BSN [place] 07/25/11 [name], RN, BSN [phone]

## 2011-08-10 NOTE — Progress Notes (Addendum)
Dr. Jimmy Footman was notified of pts. Blood sugar trending up , blood sugar was 412, with orders made to give 4 units of novolog sq now and recheck blood sugar after an hour, then start on sensitive novolog sliding scale.

## 2011-08-10 NOTE — Transfer of Care (Signed)
Immediate Anesthesia Transfer of Care Note  Patient: Dale [name]  Procedure(s) Performed: Procedure(s) (LRB): LYMPH NODE BIOPSY (Left)  Patient Location: PACU  Anesthesia Type: General  Level of Consciousness: awake, alert  and oriented  Airway & Oxygen Therapy: Patient Spontanous Breathing and Patient connected to nasal cannula oxygen  Post-op Assessment: Report given to PACU RN, Post -op Vital signs reviewed and stable and Patient moving all extremities X 4  Post vital signs: Reviewed and stable  Complications: No apparent anesthesia complications

## 2011-08-10 NOTE — Anesthesia Postprocedure Evaluation (Signed)
Anesthesia Post Note  Patient: Dale [name]  Procedure(s) Performed: Procedure(s) (LRB): LYMPH NODE BIOPSY (Left)  Anesthesia type: General  Patient location: PACU  Post pain: Pain level controlled and Adequate analgesia  Post assessment: Post-op Vital signs reviewed, Patient's Cardiovascular Status Stable, Respiratory Function Stable, Patent Airway and Pain level controlled  Last Vitals:  Filed Vitals:   08/10/11 1237  BP: 124/55  Pulse: 91  Temp: 36.7 C  Resp: 0    Post vital signs: Reviewed and stable  Level of consciousness: awake, alert  and oriented  Complications: No apparent anesthesia complications

## 2011-08-10 NOTE — Anesthesia Preprocedure Evaluation (Signed)
Anesthesia Evaluation  Patient identified by MRN, date of birth, ID band Patient awake    Reviewed: Allergy & Precautions, H&P , NPO status , Patient's Chart, lab work & pertinent test results  Airway Mallampati: III  Neck ROM: full    Dental   Pulmonary shortness of breath, sleep apnea ,          Cardiovascular +CHF     Neuro/Psych    GI/Hepatic   Endo/Other  Diabetes mellitus-, Type 2Morbid obesity  Renal/GU ESRFRenal disease     Musculoskeletal   Abdominal   Peds  Hematology   Anesthesia Other Findings   Reproductive/Obstetrics                           Anesthesia Physical Anesthesia Plan  ASA: IV  Anesthesia Plan: General   Post-op Pain Management:    Induction: Intravenous  Airway Management Planned: LMA  Additional Equipment:   Intra-op Plan:   Post-operative Plan:   Informed Consent: I have reviewed the patients History and Physical, chart, labs and discussed the procedure including the risks, benefits and alternatives for the proposed anesthesia with the patient or authorized representative who has indicated his/her understanding and acceptance.     Plan Discussed with: CRNA and Surgeon  Anesthesia Plan Comments:         Anesthesia Quick Evaluation

## 2011-08-10 NOTE — Preoperative (Signed)
Beta Blockers   Reason not to administer Beta Blockers:Not Applicable 

## 2011-08-10 NOTE — Anesthesia Procedure Notes (Signed)
Procedure Name: Intubation Date/Time: 08/10/2011 11:40 AM Performed by: Carola Frost Pre-anesthesia Checklist: Patient identified, Timeout performed, Emergency Drugs available, Suction available and Patient being monitored Patient Re-evaluated:Patient Re-evaluated prior to inductionOxygen Delivery Method: Circle system utilized Preoxygenation: Pre-oxygenation with 100% oxygen Intubation Type: IV induction and Cricoid Pressure applied Ventilation: Oral airway inserted - appropriate to patient size and Mask ventilation without difficulty Laryngoscope Size: Mac and 4 Grade View: Grade II Tube type: Oral Tube size: 7.5 mm Number of attempts: 1 Airway Equipment and Method: Stylet

## 2011-08-11 ENCOUNTER — Encounter (HOSPITAL_COMMUNITY): Payer: Self-pay | Admitting: General Surgery

## 2011-08-11 ENCOUNTER — Inpatient Hospital Stay (HOSPITAL_COMMUNITY): Payer: Medicare Other

## 2011-08-11 LAB — RENAL FUNCTION PANEL
Albumin: 1.3 g/dL — ABNORMAL LOW (ref 3.5–5.2)
BUN: 39 mg/dL — ABNORMAL HIGH (ref 6–23)
CO2: 24 mEq/L (ref 19–32)
Calcium: 8.5 mg/dL (ref 8.4–10.5)
Chloride: 91 mEq/L — ABNORMAL LOW (ref 96–112)
Creatinine, Ser: 3.86 mg/dL — ABNORMAL HIGH (ref 0.50–1.35)
GFR calc Af Amer: 20 mL/min — ABNORMAL LOW (ref >90)
GFR calc non Af Amer: 17 mL/min — ABNORMAL LOW (ref >90)
Glucose, Bld: 305 mg/dL — ABNORMAL HIGH (ref 70–99)
Phosphorus: 5.3 mg/dL — ABNORMAL HIGH (ref 2.3–4.6)
Potassium: 4.4 mEq/L (ref 3.5–5.1)
Sodium: 126 mEq/L — ABNORMAL LOW (ref 135–145)

## 2011-08-11 LAB — GLUCOSE, CAPILLARY
Glucose-Capillary: 191 mg/dL — ABNORMAL HIGH (ref 70–99)
Glucose-Capillary: 299 mg/dL — ABNORMAL HIGH (ref 70–99)
Glucose-Capillary: 167 mg/dL — ABNORMAL HIGH (ref 70–99)
Glucose-Capillary: 182 mg/dL — ABNORMAL HIGH (ref 70–99)
Glucose-Capillary: 150 mg/dL — ABNORMAL HIGH (ref 70–99)
Glucose-Capillary: 254 mg/dL — ABNORMAL HIGH (ref 70–99)

## 2011-08-11 LAB — CBC
HCT: 31 % — ABNORMAL LOW (ref 39.0–52.0)
Hemoglobin: 9.3 g/dL — ABNORMAL LOW (ref 13.0–17.0)
MCH: 26.8 pg (ref 26.0–34.0)
MCHC: 30 g/dL (ref 30.0–36.0)
MCV: 89.3 fL (ref 78.0–100.0)
Platelets: 370 10*3/uL (ref 150–400)
RBC: 3.47 MIL/uL — ABNORMAL LOW (ref 4.22–5.81)
RDW: 18.2 % — ABNORMAL HIGH (ref 11.5–15.5)
WBC: 7 10*3/uL (ref 4.0–10.5)

## 2011-08-11 MED ORDER — DIPHENHYDRAMINE HCL 25 MG PO CAPS
ORAL_CAPSULE | ORAL | Status: AC
  Administered 2011-08-11: 25 mg via ORAL
  Filled 2011-08-11: qty 1

## 2011-08-11 MED ORDER — HYDROMORPHONE HCL 2 MG PO TABS
ORAL_TABLET | ORAL | Status: AC
  Administered 2011-08-11: 2 mg via ORAL
  Filled 2011-08-11: qty 1

## 2011-08-11 MED ORDER — PARICALCITOL 5 MCG/ML IV SOLN
INTRAVENOUS | Status: AC
  Administered 2011-08-11: 2 ug via INTRAVENOUS
  Filled 2011-08-11: qty 1

## 2011-08-11 MED ORDER — DIPHENHYDRAMINE HCL 50 MG/ML IJ SOLN
12.5000 mg | Freq: Four times a day (QID) | INTRAMUSCULAR | Status: DC | PRN
  Administered 2011-08-11 – 2011-08-13 (×5): 12.5 mg via INTRAVENOUS
  Filled 2011-08-11 (×5): qty 1

## 2011-08-11 NOTE — Progress Notes (Signed)
Clinicals sent to [facility], awaiting authorization for skilled care.  Dale [name], Dale [name], Dale [name]

## 2011-08-11 NOTE — Progress Notes (Signed)
Called Dr. Verlon Au concerning pt's request to go outside with his dad. Md ok with pt going off floor to go outside. Pt remains stable. VS and CBG stable. Pt left unit in wheelchair accompanied by his dad. Dale [name], Dale [name]

## 2011-08-11 NOTE — Social Work (Signed)
[name] was declined from skilled nursing facility care by [facility] and has made arrangements to go home. Case mananagement was notified to assess for home health needs. Please alert [place] if new needs arise.  Elana Alm, [place]

## 2011-08-11 NOTE — Progress Notes (Signed)
Willy Charrier [id number] is a 46 y.o. male,  Outpatient Primary MD for the patient is No primary provider on file.  Chief Complaint  Patient presents with  . Weakness  . Shortness of Breath        Subjective:    Alexie Kohlbeck today has, No headache, No chest pain, No abdominal pain - No Nausea, No new weakness tingling or numbness, no Cough - No SOB. Slight nausea and dysguesia today.    Objective:   Filed Vitals:   08/11/11 0900 08/11/11 0930 08/11/11 1000 08/11/11 1015  BP: 90/46 104/50 96/49 112/46  Pulse: 83 82 83 84  Temp:    97.5 F (36.4 C)  TempSrc:    Oral  Resp:   18 20  Height:      Weight:    116.1 kg (255 lb 15.3 oz)  SpO2:    95%    Wt Readings from Last 3 Encounters:  08/11/11 116.1 kg (255 lb 15.3 oz)  08/11/11 116.1 kg (255 lb 15.3 oz)  08/11/11 116.1 kg (255 lb 15.3 oz)     Intake/Output Summary (Last 24 hours) at 08/11/11 1429 Last data filed at 08/11/11 1015  Gross per 24 hour  Intake    360 ml  Output   2313 ml  Net  -1953 ml    Exam Awake alert No new F.N deficits, Normal affect [place].AT,PERRAL Supple Neck,No JVD, No cervical lymphadenopathy appriciated.  Symmetrical Chest wall movement, Good air movement bilaterally, CTAB RRR,No Gallops,Rubs or new Murmurs, No Parasternal Heave +ve B.Sounds, Abd Soft, Non tender, No organomegaly appriciated, No rebound -guarding or rigidity. 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-clean wound   Data Review  CBC  Lab 08/11/11 0500 08/10/11 0803 08/09/11 0839 08/06/11 0600  WBC 7.0 5.1 8.0 9.5  HGB 9.3* 9.1* 8.9* 8.9*  HCT 31.0* 30.4* 29.4* 29.1*  PLT 370 316 381 309  MCV 89.3 89.9 87.0 86.9  MCH 26.8 26.9 26.3 26.6  MCHC 30.0 29.9* 30.3 30.6  RDW 18.2* 18.1* 18.0* 18.8*  LYMPHSABS -- -- -- --  MONOABS -- -- -- --  EOSABS -- -- -- --  BASOSABS -- -- -- --  BANDABS -- -- -- --    Chemistries   Lab 08/11/11 0500 08/10/11 0803 08/09/11 0839 08/06/11 0600  NA 126* 131* 128* 129*  K 4.4 3.8 4.2  4.3  CL 91* 95* 91* 93*  CO2 24 28 23 22   GLUCOSE 305* 261* 289* 250*  BUN 39* 31* 66* 68*  CREATININE 3.86* 3.09* 3.63* 4.81*  CALCIUM 8.5 8.5 8.7 8.7  MG -- -- -- --  AST -- -- -- --  ALT -- -- -- --  ALKPHOS -- -- -- --  BILITOT -- -- -- --   ------------------------------------------------------------------------------------------------------------------ estimated creatinine clearance is 29.9 ml/min (by C-G formula based on Cr of 3.86). ------------------------------------------------------------------------------------------------------------------ No results found for this basename: HGBA1C:2 in the last 72 hours ------------------------------------------------------------------------------------------------------------------ No results found for this basename: CHOL:2,HDL:2,LDLCALC:2,TRIG:2,CHOLHDL:2,LDLDIRECT:2 in the last 72 hours ------------------------------------------------------------------------------------------------------------------ No results found for this basename: TSH,T4TOTAL,FREET3,T3FREE,THYROIDAB in the last 72 hours ------------------------------------------------------------------------------------------------------------------ No results found for this basename: VITAMINB12:2,FOLATE:2,FERRITIN:2,TIBC:2,IRON:2,RETICCTPCT:2 in the last 72 hours  Coagulation profile  Lab 08/05/11 1109  INR 1.63*  PROTIME --    No results found for this basename: DDIMER:2 in the last 72 hours  Cardiac Enzymes No results found for this basename: CK:3,CKMB:3,TROPONINI:3,MYOGLOBIN:3 in the last 168 hours ------------------------------------------------------------------------------------------------------------------ No components found with this basename: POCBNP:3  Micro Results Recent Results (from the past  240 hour(s))  CULTURE, BLOOD (ROUTINE X 2)     Status: Normal   Collection Time   08/02/11  3:52 PM      Component Value Range Status Comment   Specimen Description  BLOOD HEMODIALYSIS CATHETER   Final    Special Requests     Final    Value: BOTTLES DRAWN AEROBIC AND ANAEROBIC 10CC RIGHT IJ CATH   Culture  Setup Time [id number]   Final    Culture NO GROWTH 5 DAYS   Final    Report Status 08/09/2011 FINAL   Final   CULTURE, BLOOD (ROUTINE X 2)     Status: Normal   Collection Time   08/02/11  5:40 PM      Component Value Range Status Comment   Specimen Description BLOOD HEMODIALYSIS CATHETER   Final    Special Requests BOTTLES DRAWN AEROBIC AND ANAEROBIC 10CC RIGHT IJ   Final    Culture  Setup Time [id number]   Final    Culture NO GROWTH 5 DAYS   Final    Report Status 08/09/2011 FINAL   Final   URINE CULTURE     Status: Normal   Collection Time   08/04/11  4:11 PM      Component Value Range Status Comment   Specimen Description URINE, RANDOM   Final    Special Requests NONE   Final    Culture  Setup Time [id number]   Final    Colony Count >=100,000 COLONIES/ML   Final    Culture YEAST   Final    Report Status 08/06/2011 FINAL   Final   AFB CULTURE WITH SMEAR     Status: Normal (Preliminary result)   Collection Time   08/05/11  4:52 PM      Component Value Range Status Comment   Specimen Description TISSUE LYMPH NODE   Final    Special Requests NONE   Final    ACID FAST SMEAR NO ACID FAST BACILLI SEEN   Final    Culture     Final    Value: CULTURE WILL BE EXAMINED FOR 6 WEEKS BEFORE ISSUING A FINAL REPORT   Report Status PENDING   Incomplete   FUNGUS CULTURE W SMEAR     Status: Normal (Preliminary result)   Collection Time   08/05/11  4:52 PM      Component Value Range Status Comment   Specimen Description TISSUE LYMPH NODE   Final    Special Requests NONE   Final    Fungal Smear NO YEAST OR FUNGAL ELEMENTS SEEN   Final    Culture CULTURE IN PROGRESS FOR FOUR WEEKS   Final    Report Status PENDING   Incomplete   TISSUE CULTURE     Status: Normal   Collection Time   08/05/11  4:52 PM      Component Value Range Status Comment    Specimen Description TISSUE LYMPH NODE   Final    Special Requests NONE   Final    Gram Stain     Final    Value: NO WBC SEEN     NO SQUAMOUS EPITHELIAL CELLS SEEN     NO ORGANISMS SEEN   Culture NO GROWTH 3 DAYS   Final    Report Status 08/09/2011 FINAL   Final     Radiology Reports Dg Chest 2 View  08/02/2011  *RADIOLOGY REPORT*  Clinical Data: History of difficulty breathing.  History of fever.  CHEST - 2 VIEW  Comparison: 07/22/2011.  08/01/2011.  Findings: Right sided dialysis catheter has tips in right atrial region.  There is stable cardiac silhouette enlargement.  There is upper lobe vascular prominence with pulmonary venous hypertension vascular congestion pattern.  No consolidation or definite pleural effusion is seen.  There is slight elevation of the margin of the left hemidiaphragm with minimal basilar atelectasis. On the lateral image there appears to be some posterior atelectasis and patchy infiltrative density without consolidation.  There is minimal degenerative spondylosis.  IMPRESSION: Dialysis catheter in place.  Stable cardiac silhouette enlargement. Pulmonary venous hypertension vascular congestion pattern.  No consolidation or pleural effusion. Left basilar and posterior atelectasis and patchy infiltrative densities without consolidation.  Original Report Authenticated By: Delane Ginger, M.D.   Dg Chest 2 View  07/22/2011  *RADIOLOGY REPORT*  Clinical Data: Fever for 1 week with cough, shortness of breath, hypertension and diabetes  CHEST - 2 VIEW  Comparison: 10/19/2004  Findings: Heart and mediastinal contours are stable with an unchanged degree of mild cardiomegaly.  The lung fields are notable for mild pulmonary vascular congestion.  No overt congestive failure, interstitial septal lines or significant peribronchial cuffing is seen to suggest early interstitial edema. A slight increase in interstitial markings are seen diffusely and may indicate underlying viral pneumonitis.  No  focal infiltrates are seen to suggest an area of focal bronchopneumonia.  No pleural fluid is seen.  Bony structures appear intact.  IMPRESSION: Mild pulmonary vascular congestion and mild increase in interstitial markings suspicious for viral pneumonitis given history of fever.  No findings suggestive of bronchopneumonia.  Original Report Authenticated By: Ander Gaster, M.D.   US Abdomen Limited  07/14/2011  *RADIOLOGY REPORT*  Clinical Data:  Elevated liver function tests, history of renal transplantation  LIMITED ABDOMINAL ULTRASOUND - RIGHT UPPER QUADRANT  Comparison:  CT abdomen of 08/26/2004  Findings:  Gallbladder:  The gallbladder is visualized and no gallstones are noted.  There is no pain over the gallbladder with compression.  Common bile duct:  The common bile duct is normal measuring 5.0 mm in diameter.  Liver:  The liver is echogenic consistent with fatty infiltration. Also the liver measures 20.1 cm sagittally indicating hepatomegaly. No ductal dilatation is seen.  IMPRESSION:  1.  Fatty infiltration of the liver.  Hepatomegaly. 2.  No gallstones.                   Original Report Authenticated By: Joretta Bachelor, M.D.   Dg Chest Port 1 View  08/01/2011  *RADIOLOGY REPORT*  Clinical Data: Central line placement.  PORTABLE CHEST - 1 VIEW  Comparison: 07/22/2011.  Findings: 1504 hours.  Right IJ dialysis catheter tips are present within the right atrium.  There is stable cardiomegaly with chronic vascular congestion.  No confluent airspace opacity, significant pleural effusion or pneumothorax is seen.  IMPRESSION: Dialysis catheter placement without demonstrated pneumothorax. Stable cardiomegaly and vascular congestion.  Original Report Authenticated By: Vivia Ewing, M.D.     Scheduled Meds:    . benzonatate  100 mg Oral TID  . calcium acetate  1,334 mg Oral TID WC  . cycloSPORINE modified  100 mg Oral BID  . darbepoetin (ARANESP) injection - DIALYSIS  150 mcg Intravenous Q  Tue-HD  . diphenhydrAMINE  25 mg Oral QHS,MR X 1  . diphenhydrAMINE  12.5 mg Intravenous Once  . insulin aspart  0-5 Units Subcutaneous QHS  . insulin aspart  0-9 Units Subcutaneous TID WC  . insulin  aspart  3 Units Subcutaneous TID WC  . insulin glargine  12 Units Subcutaneous BID  . isosorbide mononitrate  60 mg Oral Daily  . labetalol  100 mg Oral BID  . levothyroxine  75 mcg Oral QAC breakfast  . metoCLOPramide  5 mg Oral TID PC & HS  . multivitamin  1 tablet Oral Daily  . pantoprazole  40 mg Oral Q1200  . paricalcitol  2 mcg Intravenous Q T,Th,Sa-HD  . predniSONE  5 mg Oral Q breakfast  . rOPINIRole  1 mg Oral QHS  . sodium chloride  3 mL Intravenous Q12H  . DISCONTD: insulin aspart  0-9 Units Subcutaneous Q2H   Continuous Infusions:  PRN Meds:.acetaminophen, acetaminophen, albuterol, alum & mag hydroxide-simeth, heparin, heparin, heparin, heparin, heparin, HYDROmorphone (DILAUDID) injection, HYDROmorphone, menthol-cetylpyridinium, ondansetron (ZOFRAN) IV, ondansetron (ZOFRAN) IV, ondansetron, DISCONTD: HYDROmorphone  Assessment & Plan   47 year old man presented to short stay for iron infusion. Was found to have a hemoglobin of 6.7 and referred to the emergency department. There is also some concern of fever upon admission thought to be Viral URI at that time, however fever resolved immediately an initial infectious workup was unremarkable. He has Anemia (AOCD) and had PRBC transfusion initially,  he remained hospitalized because of worsening renal failure - when this failed to significantly improved the patient underwent AV graft placement and temporary dialysis catheter placement and was started on hemodialysis April 8. He developed fever and April 8 for the first time since admission. ID was consulted and after CT scan of abdomen and pelvis it was found that patient had diffuse lymphadenopathy suspicious for lymphoma. He should was offered excision biopsy versus CT-guided biopsy by  interventional radiology, patient at that time opted not to have excisional biopsy and first give a trial of CT-guided needle biopsy, he was clearly told that this was not the best method of obtaining samples however he at that time was insistent that CT-guided biopsy be given a shot first, however now his delirium has improved and he is agreeable to excision biopsy if needed in the future. I have talked to pathologist on-call on 08/08/2011 he is reviewing the samples with special staining and has sent samples for flow cytometry also. April 16 I discussed the biopsy results with pathologist on-call doctor Shur who recommended that there is clinical uncertainty with the present specimen, general surgery called for excision biopsy. After excisional biopsy patient can have outpatient followup with him on, we had [facility]-Patient seems to have made enough progress to go home with PT/OT and will need close follow-up of the Excisional biopsy performed   Past medical history:   End-stage renal disease secondary to diabetic nephropathy, status post renal transplant 1995, refractory anemia (on Procrit and iron infusion as an outpatient), fibromyalgia.     Consultants:   Nephrology  Vascular surgery ID IR   Procedures:   March 30: 2-D echocardiogram: Left ventricular ejection fraction 55-60%. Normal wall motion.  March 29: Transfused 2 units packed blood cells.  April 4: Right arm and central venogram  April 5: Creation of right brachial artery to cephalic vein AV fistula  April 5: Transfusion one unit packed red blood cells.  April 8: Right IJ hemodialysis catheter. April 12 CT guided Node biopsy by IR April 16 - discussed the biopsy results with pathologist on-call doctor Shur who recommended that there is clinical uncertainty with the present specimen, general surgery called for excision biopsy. April 17-excisional biopsy  done    Low-grade  Fevers: Resolved since admission until recurrence April 8 in the evening. Etiology unclear. On initial presentation it was presumed that his fever was secondary to viral pneumonitis. Influenza was negative. No fever recurred until April 8.  Appreciate Dr. Tommy Medal infectious disease input, CT A&P suggests ? Lymphoma vs reactive lymphadenopathy due to immunosuppression from transplant medications, post US guided biopsy-had excisional biopsy to 4/17 awaiting further workup. Will need follow-up of this and reassessment with Surgical pathology when available  Other medical problems   1.  Acute/chronic renal failure: Hemodialysis initiated April 8. Status post AV fistula April 5. Renal following. Now getting 3 times a week dialysis Tuesday Thursday and Saturday schedule, has failed renal transplant-was done 18 years ago.  Will likely be dialysis dependant and awaiting CLIP prior to d/c home   2. Hyperkalemia: Resolved. Secondary to acute renal failure. Not on potassium supplementation, heparin products or ACE-I/ARB.    4. Refractory anemia: Stable. Fecal occult blood negative. Status post total of 5 units packed red blood cells since admission. Currently H&H appears to be stable we'll continue to monitor.   5. Diabetes mellitus: Stable. Adjusted Lantus to twice a day dose with good effect and ISS, closely monitor PM CBGs-patient was discontinued off of insulin yesterday given he had low blood sugar of 39.  We'll start Lantus 12 units twice a day and change no part sensitive 3 times a day a.c. with meals and back off meal-time coverage to 3 units 3 times a day  CBG (last 3)   Basename 08/11/11 1240 08/11/11 1130 08/10/11 2141  GLUCAP 182* 167* 299*    6. History of renal transplantation with allograft nephropathy (failed): Per nephrology. Continue cyclosporin and prednisone needs outpt taper. Now T,TH,Sat dialysis schedule-[facility].  Unclear etiology of whether this may need further  transplant. Per nephrologist to determine  7. Obstructive sleep apnea: Patient refuses CPAP.  On and off confused in am likely retains, have counseled to start wearing it, will order QHS CPAP--patient continues not to wear.  8. Mild delirium - waxing and waning, as in #8, patient refused CT brain, stable UA, CXR. + QHS CPAP encouraged again. Reduced narcotics and benzos 07-07-11. Delirium appears to be improving post medication adjustment.  DVT Prophylaxis   SCDs Darl Kuss,JAI M.D on 08/11/2011 at 2:29 PM  [facility] Office  [phone]  endocrine Monday

## 2011-08-11 NOTE — Progress Notes (Signed)
Patient ID: Dale [name], male   DOB: [birth year], 46 y.o.   MRN: [id number]  [facility] Progress Note    Subjective:   Feels better   Objective:   BP 96/49  Pulse 83  Temp(Src) 97.4 F (36.3 C) (Oral)  Resp 18  Ht 5\' 8"  (1.727 m)  Wt 117.1 kg (258 lb 2.5 oz)  BMI 39.25 kg/m2  SpO2 96%  Physical Exam: Gen:WD WN obese WM in NAD CVS:no rub Resp:CTA [id number], NT [id number] arm edema, AVF +T/B  Labs: BMET  Lab 08/11/11 0500 08/10/11 0803 08/09/11 0839 08/06/11 0600 08/04/11 1020  NA 126* 131* 128* 129* 131*  K 4.4 3.8 4.2 4.3 3.5  CL 91* 95* 91* 93* 94*  CO2 24 28 23 22 23   GLUCOSE 305* 261* 289* 250* 51*  BUN 39* 31* 66* 68* 84*  CREATININE 3.86* 3.09* 3.63* 4.81* 5.40*  ALB -- -- -- -- --  CALCIUM 8.5 8.5 8.7 8.7 8.7  PHOS 5.3* 4.2 4.9* 6.0* 8.0*   CBC  Lab 08/11/11 0500 08/10/11 0803 08/09/11 0839 08/06/11 0600  WBC 7.0 5.1 8.0 9.5  NEUTROABS -- -- -- --  HGB 9.3* 9.1* 8.9* 8.9*  HCT 31.0* 30.4* 29.4* 29.1*  MCV 89.3 89.9 87.0 86.9  PLT 370 316 381 309    @IMGRELPRIORS @ Medications:      . benzonatate  100 mg Oral TID  . calcium acetate  1,334 mg Oral TID WC  . cycloSPORINE modified  100 mg Oral BID  . darbepoetin (ARANESP) injection - DIALYSIS  150 mcg Intravenous Q Tue-HD  . diphenhydrAMINE  25 mg Oral QHS,MR X 1  . diphenhydrAMINE  12.5 mg Intravenous Once  . insulin aspart  0-5 Units Subcutaneous QHS  . insulin aspart  0-9 Units Subcutaneous TID WC  . insulin aspart  3 Units Subcutaneous TID WC  . insulin glargine  12 Units Subcutaneous BID  . isosorbide mononitrate  60 mg Oral Daily  . labetalol  100 mg Oral BID  . levothyroxine  75 mcg Oral QAC breakfast  . metoCLOPramide  5 mg Oral TID PC & HS  . multivitamin  1 tablet Oral Daily  . pantoprazole  40 mg Oral Q1200  . paricalcitol  2 mcg Intravenous Q T,Th,Sa-HD  . predniSONE  5 mg Oral Q breakfast  . rOPINIRole  1 mg Oral QHS  . sodium chloride  3 mL Intravenous Q12H  .  vancomycin  1,000 mg Intravenous Once  . DISCONTD: insulin aspart  0-9 Units Subcutaneous Q2H     Assessment/ Plan:    1. ESRD - New start to HD after failed renal transplant, restarted on 4/8, on TTS using right IJ catheter placed on 4/8; on cyclosporine & prednisone. Next HD pending today.  2. HTN/Volume - BP fairly well controlled (most recently 144/62), on Labetalol 100 mg bid; wt up to 116.4 kg today. UF goal of 3-4 L today.  3. Anemia - Hgb up to 8.9, Fe sat 11%, but Ferritin 2190; on Aranesp 150 mcg on Tues.  4. Secondary Hyperparathyroidism - Ca 8.7 (10.2 corrected), P 6, iPTH 172.4, on Phoslo 2 with meals, Zemplar 2 mcg per HD.  5. Fever/LAN - CT scan showed new retroperitoneal and mesenteric lymphadenopathy. LN Bx done by IR today  6. Disposition - ?short-term rehab at [facility] vs [facility] pending decision from PT/OT/CIR, but awaiting setup for outpt HD.  7.   Aldona Bryner A 08/11/2011, 10:16 AM

## 2011-08-11 NOTE — Progress Notes (Signed)
Patient ID: Dale [name], male   DOB: Aug 27, 1965, 46 y.o.   MRN: [id number] 1 Day Post-Op  Subjective: Pt feels ok.  Some soreness and burning but otherwise no c/o.  Objective: Vital signs in last 24 hours: Temp:  [97.4 F (36.3 C)-98.8 F (37.1 C)] 97.4 F (36.3 C) (04/18 0558) Pulse Rate:  [78-91] 83  (04/18 0800) Resp:  [0-21] 17  (04/18 0800) BP: (69-126)/(37-80) 121/61 mmHg (04/18 0800) SpO2:  [95 %-100 %] 96 % (04/18 0558) FiO2 (%):  [2 %] 2 % (04/17 1335) Weight:  [258 lb 2.5 oz (117.1 kg)-259 lb 14.8 oz (117.9 kg)] 258 lb 2.5 oz (117.1 kg) (04/18 0558) Last BM Date: 08/10/11  Intake/Output from previous day: 04/17 0701 - 04/18 0700 In: 560 [P.O.:360; I.V.:200] Out: 110 [Urine:100; Blood:10] Intake/Output this shift:    PE: GU:  Left groin with closed incision.  This is c/d/i with dermabond.  There is some skin excoriation more medial in his groin region that is not related to his surgery.  Lab Results:   Basename 08/11/11 0500 08/10/11 0803  WBC 7.0 5.1  HGB 9.3* 9.1*  HCT 31.0* 30.4*  PLT 370 316   BMET  Basename 08/10/11 0803 08/09/11 0839  NA 131* 128*  K 3.8 4.2  CL 95* 91*  CO2 28 23  GLUCOSE 261* 289*  BUN 31* 66*  CREATININE 3.09* 3.63*  CALCIUM 8.5 8.7   PT/INR No results found for this basename: LABPROT:2,INR:2 in the last 72 hours CMP     Component Value Date/Time   NA 131* 08/10/2011 0803   K 3.8 08/10/2011 0803   CL 95* 08/10/2011 0803   CO2 28 08/10/2011 0803   GLUCOSE 261* 08/10/2011 0803   BUN 31* 08/10/2011 0803   CREATININE 3.09* 08/10/2011 0803   CALCIUM 8.5 08/10/2011 0803   PROT 7.4 07/22/2011 1457   ALBUMIN 1.3* 08/10/2011 0803   AST 51* 07/22/2011 1457   ALT 57* 07/22/2011 1457   ALKPHOS 549* 07/22/2011 1457   BILITOT 0.6 07/22/2011 1457   GFRNONAA 23* 08/10/2011 0803   GFRAA 26* 08/10/2011 0803   Lipase  No results found for this basename: lipase       Studies/Results: No results found.  Anti-infectives: Anti-infectives    Start     Dose/Rate Route Frequency Ordered Stop   08/10/11 0000   vancomycin (VANCOCIN) IVPB 1000 mg/200 mL premix     Comments: On call to OR 08-10-11      1,000 mg 200 mL/hr over 60 Minutes Intravenous  Once 08/09/11 1519 08/10/11 1135   07/31/11 1358   vancomycin (VANCOCIN) IVPB 1000 mg/200 mL premix  Status:  Discontinued        1,000 mg 200 mL/hr over 60 Minutes Intravenous 60 min pre-op 07/31/11 1358 08/01/11 1555           Assessment/Plan  1. S/p LN bx  Plan: 1. His wound looks good.  There are no staples or sutures that need to be removed.  No further surgical intervention needed.  Pathology is still pending.  Please call if needed.   LOS: 20 days    Louie Flenner E 08/11/2011

## 2011-08-11 NOTE — Progress Notes (Signed)
Physical Therapy Treatment Patient Details Name: Dale [name] MRN: [id number] DOB: May 02, 1965 Today's Date: 08/11/2011  PT Assessment/Plan  PT - Assessment/Plan Comments on Treatment Session: pt presents s/p Lymph node biopsy and with HD this am.  pt c/o fatigued, but motivated to attempt some ambulation.  pt notes anticipating D/C to home later today.  pt has all needed DME and will have HHPT at D/C.   PT Plan: Discharge plan remains appropriate;Frequency remains appropriate PT Frequency: Min 3X/week Follow Up Recommendations: Home health PT;Supervision - Intermittent Equipment Recommended: 3 in 1 bedside comode PT Goals  Acute Rehab PT Goals PT Goal: Supine/Side to Sit - Progress: Met PT Goal: Sit to Stand - Progress: Progressing toward goal PT Transfer Goal: Bed to Chair/Chair to Bed - Progress: Progressing toward goal PT Goal: Ambulate - Progress: Progressing toward goal  PT Treatment Precautions/Restrictions  Precautions Precautions: Fall Required Braces or Orthoses: Other Brace/Splint Other Brace/Splint: LLE prothesis Restrictions Weight Bearing Restrictions: No Mobility (including Balance) Bed Mobility Bed Mobility: Yes Supine to Sit: 6: Modified independent (Device/Increase time);With rails Sitting - Scoot to Edge of Bed: 6: Modified independent (Device/Increase time) Transfers Transfers: Yes Sit to Stand: 4: Min assist;With upper extremity assist;From bed Sit to Stand Details (indicate cue type and reason): demos good technique Stand to Sit: 4: Min assist;With upper extremity assist;To bed Stand to Sit Details: cues to get closer to bed prior to sitting as pt rushing to get to bed secondary to fatigue.   Ambulation/Gait Ambulation/Gait: Yes Ambulation/Gait Assistance: 4: Min assist Ambulation/Gait Assistance Details (indicate cue type and reason): cues to stay closer to RW and not to rush as fatigue increased.   Ambulation Distance (Feet): 30 Feet Assistive device:  Rolling walker Gait Pattern: Lateral trunk lean to right;Lateral trunk lean to left;Decreased stride length Stairs: No Wheelchair Mobility Wheelchair Mobility: No  Posture/Postural Control Posture/Postural Control: No significant limitations Balance Balance Assessed: No Exercise    End of Session PT - End of Session Equipment Utilized During Treatment: Gait belt;Left lower extremity prosthesis Activity Tolerance: Patient limited by fatigue (HD this am.  ) Patient left: with call bell in reach;in bed;with family/visitor present (sitting EOB) Nurse Communication: Mobility status for transfers;Mobility status for ambulation General Behavior During Session: [facility] for tasks performed Cognition: [facility] for tasks performed  Catarina Hartshorn, [place] 08/11/2011, 12:27 PM

## 2011-08-11 NOTE — Progress Notes (Signed)
Attemped 2x to see pt today. Pt had HD this am followed by PT. Pt said he was wiped out and may be d/c'd this afternoon.  Will attempt again in am if pt still here. Jinger Neighbors, [place] [phone]

## 2011-08-11 NOTE — Progress Notes (Signed)
PT has been refusing CPAP. RT will continue to monitor.

## 2011-08-12 LAB — GLUCOSE, CAPILLARY
Glucose-Capillary: 241 mg/dL — ABNORMAL HIGH (ref 70–99)
Glucose-Capillary: 244 mg/dL — ABNORMAL HIGH (ref 70–99)
Glucose-Capillary: 153 mg/dL — ABNORMAL HIGH (ref 70–99)
Glucose-Capillary: 179 mg/dL — ABNORMAL HIGH (ref 70–99)

## 2011-08-12 MED ORDER — DOCUSATE SODIUM 100 MG PO CAPS
100.0000 mg | ORAL_CAPSULE | Freq: Two times a day (BID) | ORAL | Status: DC
  Administered 2011-08-12 – 2011-08-13 (×3): 100 mg via ORAL
  Filled 2011-08-12 (×4): qty 1

## 2011-08-12 MED ORDER — POLYETHYLENE GLYCOL 3350 17 G PO PACK
17.0000 g | PACK | Freq: Every day | ORAL | Status: DC
  Administered 2011-08-12 – 2011-08-13 (×2): 17 g via ORAL
  Filled 2011-08-12 (×2): qty 1

## 2011-08-12 MED ORDER — HEPARIN SODIUM (PORCINE) 1000 UNIT/ML DIALYSIS
20.0000 [IU]/kg | INTRAMUSCULAR | Status: DC | PRN
  Filled 2011-08-12: qty 3

## 2011-08-12 NOTE — Progress Notes (Signed)
Patient ID: Dale [name], male   DOB: [birth year], 46 y.o.   MRN: [id number]  [facility] Progress Note    Subjective:   Complains of dysuria and constipation   Objective:   BP 131/68  Pulse 86  Temp(Src) 98.6 F (37 C) (Oral)  Resp 18  Ht 5\' 8"  (1.727 m)  Wt 116.1 kg (255 lb 15.3 oz)  BMI 38.92 kg/m2  SpO2 96%  Physical Exam: Gen:WD WN obese WM in NAD CVS:no rub Resp:CTA [id number] Ext:1+ pretib edema, RUE AVF +T/B, dark eschar over incision, no erythema or drainage  Labs: BMET  Lab 08/11/11 0500 08/10/11 0803 08/09/11 0839 08/06/11 0600  NA 126* 131* 128* 129*  K 4.4 3.8 4.2 4.3  CL 91* 95* 91* 93*  CO2 24 28 23 22   GLUCOSE 305* 261* 289* 250*  BUN 39* 31* 66* 68*  CREATININE 3.86* 3.09* 3.63* 4.81*  ALB -- -- -- --  CALCIUM 8.5 8.5 8.7 8.7  PHOS 5.3* 4.2 4.9* 6.0*   CBC  Lab 08/11/11 0500 08/10/11 0803 08/09/11 0839 08/06/11 0600  WBC 7.0 5.1 8.0 9.5  NEUTROABS -- -- -- --  HGB 9.3* 9.1* 8.9* 8.9*  HCT 31.0* 30.4* 29.4* 29.1*  MCV 89.3 89.9 87.0 86.9  PLT 370 316 381 309    @IMGRELPRIORS @ Medications:      . benzonatate  100 mg Oral TID  . calcium acetate  1,334 mg Oral TID WC  . cycloSPORINE modified  100 mg Oral BID  . darbepoetin (ARANESP) injection - DIALYSIS  150 mcg Intravenous Q Tue-HD  . diphenhydrAMINE  25 mg Oral QHS,MR X 1  . insulin aspart  0-5 Units Subcutaneous QHS  . insulin aspart  0-9 Units Subcutaneous TID WC  . insulin aspart  3 Units Subcutaneous TID WC  . insulin glargine  12 Units Subcutaneous BID  . isosorbide mononitrate  60 mg Oral Daily  . labetalol  100 mg Oral BID  . levothyroxine  75 mcg Oral QAC breakfast  . metoCLOPramide  5 mg Oral TID PC & HS  . multivitamin  1 tablet Oral Daily  . pantoprazole  40 mg Oral Q1200  . paricalcitol  2 mcg Intravenous Q T,Th,Sa-HD  . predniSONE  5 mg Oral Q breakfast  . rOPINIRole  1 mg Oral QHS  . sodium chloride  3 mL Intravenous Q12H     Assessment/ Plan:   1.  Deconditioning- cont with [facility]HC PT/OT 2.ESRD- cont TTS HD 3. Anemia:cont with Epo 4. Vascular Access- PC and maturing AVF f/u with [facility] 5. Nutrition:cont with prot intake and carb mod diet 6. Hypertension:stable 7. Dispo- for d/c tomorrow after HD and f/u on Monday at [facility] to sign papers at [place] 08/12/2011, 9:58 AM

## 2011-08-12 NOTE — Progress Notes (Signed)
Hemodialysis outpatient note: This patient has been accepted at the [facility] dialysis on a TTS schedule. The center requests that the patient stop by the unit on Monday the 22nd at 11:00 to sign paperwork and begin treatment on Tuesday the 23rd. No shift schedule was given at this time, the unit will address shift time with patient on Monday. Thank you.

## 2011-08-12 NOTE — Progress Notes (Signed)
   CARE MANAGEMENT NOTE 08/12/2011  Patient:  Dale [name], Dale [name]   Account Number:  [id number]  Date Initiated:  07/25/2011  Documentation initiated by:  [name]  Subjective/Objective Assessment:   PT WAS ADMITTED WITH ANEMIA     Action/Plan:   PROGRESSION OF CARE AND DISCHARGE PLANNING   Anticipated DC Date:  08/11/2011   Anticipated DC Plan:  SKILLED NURSING FACILITY  In-house referral  Clinical Social Worker      DC Planning Services  CM consult      Choice offered to / List presented to:          [place] arranged  HH-2 PT      [place].   Status of service:  In process, will continue to follow Medicare Important Message given?   (If response is "NO", the following Medicare IM given date fields will be blank) Date Medicare IM given:   Date Additional Medicare IM given:    Discharge Disposition:  [place]  Per UR Regulation:  Reviewed for med. necessity/level of care/duration of stay  If discussed at [place] of Stay Meetings, dates discussed:   08/03/2011  08/10/2011    Comments:  08/12/11 [name], RN, BSN 1304 PT IS TO [facility] ON SAT AFTER HD.  PT HAS CHOSEN [facility].  PT HAS REFUSED 3 N 1 AS HE IS HAVING RAISED TOILETS INSTALLED IN HIS HOME SOON.  08/10/11 [name], RN, BSN [phone] SPOKE WITH PT AND HIS WIFE ABOUT DC PLANS.  I HAVE EXPLAINED TO THEM THAT HE HAS NO LTACH BENEFITS FOR [facility] AND [facility] HAS DENIED HIM.  PLAN IS FOR SNF WITH OP HD.  CSW IS TO TALK PT AND HIS WIFE ABOUT COPAYS AT SNF.  WILL F/U.  08/09/11 [name], RN, BSN 1112 UR COMPLETED, [place], FAXING PT OUT FOR SNF AS BACK UP, AWAITING RENAL TO GIVE [place] AN OP HD CENTER.  08/08/11 [name], RN, BSN 1536 PT HAS NO LTACH BENEFITS, PT IS OK WITH SNF.  HE WOULD LIKE TO BE FAXED OUT FOR [place] AND [place].  INFORMED CSW.  WILL F/U.  08/05/11 [name], RN, BSN 1610 UR COMPLETED.  PT  IS TO HAVE A NEEDLE BX TODAY.  PT HAS BEEN DENIED BY [facility] AND [facility] HAS NO HD BEDS AT THIS TIME.  WILL F/U WITH [facility] ON MONDAY.  08/03/11 Chiara Coltrin, RN, BSN 1655 PT'S WIFE HAS STATED THAT SHE CANT TAKE CARE OF HIM AT DC IF HE CONTS TO BE AS WEAK.  PT/OT TO EVAL, CIR WILL LOOK AT HIM IF IT IS RECOMMENDED.  UR COMPLETED 08/02/11 [name], RN, BNS 1239  UR COMPLETED 07/29/11 [name], RN, BSN [place] 07/25/11 [name], RN, BSN [phone]

## 2011-08-12 NOTE — Progress Notes (Signed)
Clinical Social Worker received notification from [place] that pt was set up for Dialysis at [facility] for a Tues/Thurs/Saturday schedule.  MD notified.  CSW to sign off at this time, please re consult if needed.   Dala Dock, MSW, [place]

## 2011-08-12 NOTE — Progress Notes (Signed)
Dale [name] is a 46 y.o. male,  Outpatient Primary MD for the patient is No primary provider on file.  Chief Complaint  Patient presents with  . Weakness  . Shortness of Breath        Subjective:    Dale [name] today has, No headache, No chest pain, No abdominal pain - No Nausea, No new weakness tingling or numbness, no Cough - No SOB. No further nausea, some dysuria this am after passing urine, but this was first urine after 3dyas of not passing urine, and resolved after initial passage of urine   Objective:   Filed Vitals:   08/11/11 2117 08/12/11 0530 08/12/11 0918 08/12/11 1355  BP: 115/52 145/82 131/68 112/55  Pulse: 87 83 86 90  Temp: 98 F (36.7 C) 97.8 F (36.6 C) 98.6 F (37 C) 98.4 F (36.9 C)  TempSrc: Oral Oral Oral Oral  Resp: 20 19 18 18   Height:      Weight:      SpO2: 100% 98% 96% 96%    Wt Readings from Last 3 Encounters:  08/11/11 116.1 kg (255 lb 15.3 oz)  08/11/11 116.1 kg (255 lb 15.3 oz)  08/11/11 116.1 kg (255 lb 15.3 oz)     Intake/Output Summary (Last 24 hours) at 08/12/11 1718 Last data filed at 08/12/11 0900  Gross per 24 hour  Intake    240 ml  Output      0 ml  Net    240 ml    Exam Awake alert No new F.N deficits, Normal affect [place].AT,PERRAL Supple Neck,No JVD, No cervical lymphadenopathy appriciated.  Symmetrical Chest wall movement, Good air movement bilaterally, CTAB RRR,No Gallops,Rubs or new Murmurs, No Parasternal Heave +ve B.Sounds, Abd Soft, Non tender, No organomegaly appriciated, No rebound -guarding or rigidity. 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  RLE has some red circumferential areas which are    Data Review  CBC  Lab 08/11/11 0500 08/10/11 0803 08/09/11 0839 08/06/11 0600  WBC 7.0 5.1 8.0 9.5  HGB 9.3* 9.1* 8.9* 8.9*  HCT 31.0* 30.4* 29.4* 29.1*  PLT 370 316 381 309  MCV 89.3 89.9 87.0 86.9  MCH 26.8 26.9 26.3 26.6  MCHC 30.0 29.9* 30.3 30.6  RDW 18.2* 18.1* 18.0* 18.8*    LYMPHSABS -- -- -- --  MONOABS -- -- -- --  EOSABS -- -- -- --  BASOSABS -- -- -- --  BANDABS -- -- -- --    Chemistries   Lab 08/11/11 0500 08/10/11 0803 08/09/11 0839 08/06/11 0600  NA 126* 131* 128* 129*  K 4.4 3.8 4.2 4.3  CL 91* 95* 91* 93*  CO2 24 28 23 22   GLUCOSE 305* 261* 289* 250*  BUN 39* 31* 66* 68*  CREATININE 3.86* 3.09* 3.63* 4.81*  CALCIUM 8.5 8.5 8.7 8.7  MG -- -- -- --  AST -- -- -- --  ALT -- -- -- --  ALKPHOS -- -- -- --  BILITOT -- -- -- --   ------------------------------------------------------------------------------------------------------------------ estimated creatinine clearance is 29.9 ml/min (by C-G formula based on Cr of 3.86). ------------------------------------------------------------------------------------------------------------------ No results found for this basename: HGBA1C:2 in the last 72 hours ------------------------------------------------------------------------------------------------------------------ No results found for this basename: CHOL:2,HDL:2,LDLCALC:2,TRIG:2,CHOLHDL:2,LDLDIRECT:2 in the last 72 hours ------------------------------------------------------------------------------------------------------------------ No results found for this basename: TSH,T4TOTAL,FREET3,T3FREE,THYROIDAB in the last 72 hours ------------------------------------------------------------------------------------------------------------------ No results found for this basename: VITAMINB12:2,FOLATE:2,FERRITIN:2,TIBC:2,IRON:2,RETICCTPCT:2 in the last 72 hours  Coagulation profile No results found for this basename: INR:5,PROTIME:5 in the last 168 hours  No  results found for this basename: DDIMER:2 in the last 72 hours  Cardiac Enzymes No results found for this basename: CK:3,CKMB:3,TROPONINI:3,MYOGLOBIN:3 in the last 168 hours ------------------------------------------------------------------------------------------------------------------ No  components found with this basename: POCBNP:3  Micro Results Recent Results (from the past 240 hour(s))  CULTURE, BLOOD (ROUTINE X 2)     Status: Normal   Collection Time   08/02/11  5:40 PM      Component Value Range Status Comment   Specimen Description BLOOD HEMODIALYSIS CATHETER   Final    Special Requests BOTTLES DRAWN AEROBIC AND ANAEROBIC 10CC RIGHT IJ   Final    Culture  Setup Time [id number]   Final    Culture NO GROWTH 5 DAYS   Final    Report Status 08/09/2011 FINAL   Final   URINE CULTURE     Status: Normal   Collection Time   08/04/11  4:11 PM      Component Value Range Status Comment   Specimen Description URINE, RANDOM   Final    Special Requests NONE   Final    Culture  Setup Time [id number]   Final    Colony Count >=100,000 COLONIES/ML   Final    Culture YEAST   Final    Report Status 08/06/2011 FINAL   Final   AFB CULTURE WITH SMEAR     Status: Normal (Preliminary result)   Collection Time   08/05/11  4:52 PM      Component Value Range Status Comment   Specimen Description TISSUE LYMPH NODE   Final    Special Requests NONE   Final    ACID FAST SMEAR NO ACID FAST BACILLI SEEN   Final    Culture     Final    Value: CULTURE WILL BE EXAMINED FOR 6 WEEKS BEFORE ISSUING A FINAL REPORT   Report Status PENDING   Incomplete   FUNGUS CULTURE W SMEAR     Status: Normal (Preliminary result)   Collection Time   08/05/11  4:52 PM      Component Value Range Status Comment   Specimen Description TISSUE LYMPH NODE   Final    Special Requests NONE   Final    Fungal Smear NO YEAST OR FUNGAL ELEMENTS SEEN   Final    Culture CULTURE IN PROGRESS FOR FOUR WEEKS   Final    Report Status PENDING   Incomplete   TISSUE CULTURE     Status: Normal   Collection Time   08/05/11  4:52 PM      Component Value Range Status Comment   Specimen Description TISSUE LYMPH NODE   Final    Special Requests NONE   Final    Gram Stain     Final    Value: NO WBC SEEN     NO SQUAMOUS EPITHELIAL  CELLS SEEN     NO ORGANISMS SEEN   Culture NO GROWTH 3 DAYS   Final    Report Status 08/09/2011 FINAL   Final     Radiology Reports Dg Chest 2 View  08/02/2011  *RADIOLOGY REPORT*  Clinical Data: History of difficulty breathing.  History of fever.  CHEST - 2 VIEW  Comparison: 07/22/2011.  08/01/2011.  Findings: Right sided dialysis catheter has tips in right atrial region.  There is stable cardiac silhouette enlargement.  There is upper lobe vascular prominence with pulmonary venous hypertension vascular congestion pattern.  No consolidation or definite pleural effusion is seen.  There is slight elevation of the margin  of the left hemidiaphragm with minimal basilar atelectasis. On the lateral image there appears to be some posterior atelectasis and patchy infiltrative density without consolidation.  There is minimal degenerative spondylosis.  IMPRESSION: Dialysis catheter in place.  Stable cardiac silhouette enlargement. Pulmonary venous hypertension vascular congestion pattern.  No consolidation or pleural effusion. Left basilar and posterior atelectasis and patchy infiltrative densities without consolidation.  Original Report Authenticated By: Delane Ginger, M.D.   Dg Chest 2 View  07/22/2011  *RADIOLOGY REPORT*  Clinical Data: Fever for 1 week with cough, shortness of breath, hypertension and diabetes  CHEST - 2 VIEW  Comparison: 10/19/2004  Findings: Heart and mediastinal contours are stable with an unchanged degree of mild cardiomegaly.  The lung fields are notable for mild pulmonary vascular congestion.  No overt congestive failure, interstitial septal lines or significant peribronchial cuffing is seen to suggest early interstitial edema. A slight increase in interstitial markings are seen diffusely and may indicate underlying viral pneumonitis.  No focal infiltrates are seen to suggest an area of focal bronchopneumonia.  No pleural fluid is seen.  Bony structures appear intact.  IMPRESSION: Mild  pulmonary vascular congestion and mild increase in interstitial markings suspicious for viral pneumonitis given history of fever.  No findings suggestive of bronchopneumonia.  Original Report Authenticated By: Ander Gaster, M.D.   US Abdomen Limited  07/14/2011  *RADIOLOGY REPORT*  Clinical Data:  Elevated liver function tests, history of renal transplantation  LIMITED ABDOMINAL ULTRASOUND - RIGHT UPPER QUADRANT  Comparison:  CT abdomen of 08/26/2004  Findings:  Gallbladder:  The gallbladder is visualized and no gallstones are noted.  There is no pain over the gallbladder with compression.  Common bile duct:  The common bile duct is normal measuring 5.0 mm in diameter.  Liver:  The liver is echogenic consistent with fatty infiltration. Also the liver measures 20.1 cm sagittally indicating hepatomegaly. No ductal dilatation is seen.  IMPRESSION:  1.  Fatty infiltration of the liver.  Hepatomegaly. 2.  No gallstones.                   Original Report Authenticated By: Joretta Bachelor, M.D.   Dg Chest Port 1 View  08/01/2011  *RADIOLOGY REPORT*  Clinical Data: Central line placement.  PORTABLE CHEST - 1 VIEW  Comparison: 07/22/2011.  Findings: 1504 hours.  Right IJ dialysis catheter tips are present within the right atrium.  There is stable cardiomegaly with chronic vascular congestion.  No confluent airspace opacity, significant pleural effusion or pneumothorax is seen.  IMPRESSION: Dialysis catheter placement without demonstrated pneumothorax. Stable cardiomegaly and vascular congestion.  Original Report Authenticated By: Vivia Ewing, M.D.     Scheduled Meds:    . benzonatate  100 mg Oral TID  . calcium acetate  1,334 mg Oral TID WC  . cycloSPORINE modified  100 mg Oral BID  . darbepoetin (ARANESP) injection - DIALYSIS  150 mcg Intravenous Q Tue-HD  . diphenhydrAMINE  25 mg Oral QHS,MR X 1  . docusate sodium  100 mg Oral BID  . insulin aspart  0-5 Units Subcutaneous QHS  . insulin aspart   0-9 Units Subcutaneous TID WC  . insulin aspart  3 Units Subcutaneous TID WC  . insulin glargine  12 Units Subcutaneous BID  . isosorbide mononitrate  60 mg Oral Daily  . labetalol  100 mg Oral BID  . levothyroxine  75 mcg Oral QAC breakfast  . metoCLOPramide  5 mg Oral TID PC & HS  .  multivitamin  1 tablet Oral Daily  . pantoprazole  40 mg Oral Q1200  . paricalcitol  2 mcg Intravenous Q T,Th,Sa-HD  . polyethylene glycol  17 g Oral Daily  . predniSONE  5 mg Oral Q breakfast  . rOPINIRole  1 mg Oral QHS  . sodium chloride  3 mL Intravenous Q12H   Continuous Infusions:  PRN Meds:.acetaminophen, acetaminophen, albuterol, alum & mag hydroxide-simeth, diphenhydrAMINE, heparin, HYDROmorphone (DILAUDID) injection, HYDROmorphone, menthol-cetylpyridinium, ondansetron (ZOFRAN) IV, ondansetron, DISCONTD: heparin, DISCONTD: heparin, DISCONTD: heparin, DISCONTD: heparin, DISCONTD: heparin  Assessment & Plan   46 year old man presented to short stay for iron infusion. Was found to have a hemoglobin of 6.7 and referred to the emergency department. There is also some concern of fever upon admission thought to be Viral URI at that time, however fever resolved immediately an initial infectious workup was unremarkable. He has Anemia (AOCD) and had PRBC transfusion initially,  he remained hospitalized because of worsening renal failure - when this failed to significantly improved the patient underwent AV graft placement and temporary dialysis catheter placement and was started on hemodialysis April 8. He developed fever and April 8 for the first time since admission. ID was consulted and after CT scan of abdomen and pelvis it was found that patient had diffuse lymphadenopathy suspicious for lymphoma. He should was offered excision biopsy versus CT-guided biopsy by interventional radiology, patient at that time opted not to have excisional biopsy and first give a trial of CT-guided needle biopsy, he was clearly told  that this was not the best method of obtaining samples however he at that time was insistent that CT-guided biopsy be given a shot first, however now his delirium has improved and he is agreeable to excision biopsy if needed in the future. I have talked to pathologist on-call on 08/08/2011 he is reviewing the samples with special staining and has sent samples for flow cytometry also. April 16 I discussed the biopsy results with pathologist on-call doctor Shur who recommended that there is clinical uncertainty with the present specimen, general surgery called for excision biopsy. After excisional biopsy patient can have outpatient followup with him on, we had [facility]-Patient seems to have made enough progress to go home with PT/OT and will need close follow-up of the Excisional biopsy performed   Past medical history:   End-stage renal disease secondary to diabetic nephropathy, status post renal transplant 1995, refractory anemia (on Procrit and iron infusion as an outpatient), fibromyalgia.     Consultants:   Nephrology  Vascular surgery ID IR   Procedures:   March 30: 2-D echocardiogram: Left ventricular ejection fraction 55-60%. Normal wall motion.  March 29: Transfused 2 units packed blood cells.  April 4: Right arm and central venogram  April 5: Creation of right brachial artery to cephalic vein AV fistula  April 5: Transfusion one unit packed red blood cells.  April 8: Right IJ hemodialysis catheter. April 12 CT guided Node biopsy by IR April 16 - discussed the biopsy results with pathologist on-call doctor Shur who recommended that there is clinical uncertainty with the present specimen, general surgery called for excision biopsy. April 17-excisional biopsy done    Low-grade Fevers: Resolved since admission until recurrence April 8 in the evening. Etiology unclear. On initial presentation it was presumed that his fever was  secondary to viral pneumonitis. Influenza was negative. No fever recurred until April 8.  Appreciate Dr. Tommy Medal infectious disease input, CT A&P suggests ?  Lymphoma vs reactive lymphadenopathy due to immunosuppression from transplant medications, post US guided biopsy-had excisional biopsy to 4/17 awaiting further workup. Will need follow-up of this and reassessment with Surgical pathology when available-will review  Other medical problems   1.  Acute/chronic renal failure: Hemodialysis initiated April 8. Status post AV fistula April 5. Renal following. Now getting 3 times a week dialysis Tuesday Thursday and Saturday schedule, has failed renal transplant-was done 18 years ago.  Will likely be dialysis dependant and awaiting CLIP prior to d/c home   2. Hyperkalemia: Resolved. Secondary to acute renal failure. Not on potassium supplementation, heparin products or ACE-I/ARB.    4. Refractory anemia: Stable. Fecal occult blood negative. Status post total of 5 units packed red blood cells since admission. Currently H&H appears to be stable we'll continue to monitor.   5. Diabetes mellitus: Stable. Adjusted Lantus to twice a day dose with good effect and ISS, closely monitor PM CBGs-patient was discontinued off of insulin yesterday given he had low blood sugar of 39.  We'll start Lantus 12 units twice a day and change no part sensitive 3 times a day a.c. with meals and back off meal-time coverage to 3 units 3 times a day  CBG (last 3)   Basename 08/12/11 1715 08/12/11 1143 08/12/11 0743  GLUCAP 153* 244* 241*    6. History of renal transplantation with allograft nephropathy (failed): Per nephrology. Continue cyclosporin and prednisone needs outpt taper which will be done with nephrologist inpput . Now T,TH,Sat dialysis schedule-[facility].  Unclear etiology of whether this may need further transplant. Per nephrologist to determine  7. Obstructive sleep apnea: Patient refuses CPAP.  On and  off confused in am likely retains, have counseled to start wearing it, will order QHS CPAP--patient continues not to wear.  8. Mild delirium - waxing and waning, as in #8, patient refused CT brain, stable UA, CXR. + QHS CPAP encouraged again. Reduced narcotics and benzos 07-07-11. Delirium appears to be improving post medication adjustment.  DVT Prophylaxis   SCDs Skylyn Slezak,JAI M.D on 08/12/2011 at 5:18 PM  [place]  [phone]  endocrine Monday

## 2011-08-12 NOTE — Progress Notes (Addendum)
Physical Therapy Treatment Patient Details Name: Arnoldo Salzman MRN: [id number] DOB: Jun 07, 1965 Today's Date: 08/12/2011 Time: [id number] PT Time Calculation (min): 21 min  PT Assessment / Plan / Recommendation    Follow Up Recommendations  Home health PT;Supervision - Intermittent    Equipment Recommendations  3 in 1 bedside comode;Rolling walker with 5" wheels    Frequency Min 3X/week    Precautions / Restrictions Precautions Precautions: Fall Required Braces or Orthoses: Other Brace/Splint (left LE prosthesis) Other Brace/Splint: LLE prothesis Restrictions Weight Bearing Restrictions: No       Mobility  Bed Mobility Bed Mobility: Supine to Sit Supine to Sit: 6: Modified independent (Device/Increase time);With rails Sitting - Scoot to Edge of Bed: 6: Modified independent (Device/Increase time) Transfers Transfers: Sit to Stand;Stand to Sit Sit to Stand: 4: Min assist;With upper extremity assist;From bed Stand to Sit: 4: Min guard;With upper extremity assist;To chair/3-in-1 Details for Transfer Assistance: vc's for safety and technique Ambulation/Gait Ambulation/Gait Assistance: 4: Min assist Ambulation Distance (Feet): 80 Feet Assistive device: Rolling walker Ambulation/Gait Assistance Details: cues for erect posture and walker distance Gait Pattern: Lateral trunk lean to right;Lateral trunk lean to left;Decreased stride length Gait velocity: slowed Stairs: No Wheelchair Mobility Wheelchair Mobility: No    Exercises     PT Goals Acute Rehab PT Goals PT Goal: Supine/Side to Sit - Progress: Met PT Goal: Sit to Stand - Progress: Progressing toward goal PT Goal: Ambulate - Progress: Progressing toward goal  Visit Information  Last PT Received On: 08/12/11       Cognition  Overall Cognitive Status: Appears within functional limits for tasks assessed/performed Arousal/Alertness: Lethargic Orientation Level: Oriented X4 / Intact Behavior During Session: [facility] for  tasks performed    Balance  Static Sitting Balance Static Sitting - Balance Support: No upper extremity supported;Feet supported  End of Session     Ladona Ridgel 08/12/2011, 11:34 AM Acute Rehabilitation Services [phone] (pager)  Pt. Making steady gains with PT and wife feels she is ready to assist in pt's care upon DC.  Pt. Left in recliner with call bell/phone in reach.  Wife present.   Gerlean Ren PT Acute Rehab Services [place] [phone]

## 2011-08-12 NOTE — Progress Notes (Signed)
Occupational Therapy Discharge Note  In to see pt for treatment. He reports that his wife will be at home with him for 2 weeks after he D/C's tomorrow and that he does not foresee any issues with BADLs, but if so his wife can assist him. I have also recommended HHOT for him since he is not going to SNF. Was up earlier today and does not feel like getting back up now. Acute OT signing off due to pt to D/C tomorrow.

## 2011-08-12 NOTE — Progress Notes (Signed)
Patient refuses to have CPAP set up for him tonight. Patient states that he has not done well with CPAP while in the hospital.   Patient states that he will resume CPAP when he returns home tomorrow.   Patient agrees to have the RN notify the RT if he changes his mind.  Blenda Peals, RRT, RCP

## 2011-08-12 NOTE — Progress Notes (Signed)
Dr. Marval Regal ok with pt going outside with family member. Pt remains stable. Pt transported off the unit via wheelchair accompanied by his wife. Dale [name], Franky Macho

## 2011-08-12 NOTE — Progress Notes (Signed)
Pt and wife returned to unit with pt in wheelchair. Pt stable. Pt helped back to bed. [place], Dale [name]

## 2011-08-13 ENCOUNTER — Inpatient Hospital Stay (HOSPITAL_COMMUNITY): Payer: Medicare Other

## 2011-08-13 LAB — RENAL FUNCTION PANEL
Albumin: 1.6 g/dL — ABNORMAL LOW (ref 3.5–5.2)
BUN: 29 mg/dL — ABNORMAL HIGH (ref 6–23)
CO2: 27 mEq/L (ref 19–32)
Calcium: 8.8 mg/dL (ref 8.4–10.5)
Chloride: 94 mEq/L — ABNORMAL LOW (ref 96–112)
Creatinine, Ser: 3.15 mg/dL — ABNORMAL HIGH (ref 0.50–1.35)
GFR calc Af Amer: 26 mL/min — ABNORMAL LOW (ref >90)
GFR calc non Af Amer: 22 mL/min — ABNORMAL LOW (ref >90)
Glucose, Bld: 243 mg/dL — ABNORMAL HIGH (ref 70–99)
Phosphorus: 3.8 mg/dL (ref 2.3–4.6)
Potassium: 4.1 mEq/L (ref 3.5–5.1)
Sodium: 130 mEq/L — ABNORMAL LOW (ref 135–145)

## 2011-08-13 MED ORDER — PARICALCITOL 5 MCG/ML IV SOLN: 2.0000 ug | INTRAVENOUS | Status: DC

## 2011-08-13 MED ORDER — INSULIN GLARGINE 100 UNIT/ML ~~LOC~~ SOLN: 12.0000 [IU] | Freq: Two times a day (BID) | SUBCUTANEOUS | Status: DC

## 2011-08-13 MED ORDER — HYDROMORPHONE HCL PF 1 MG/ML IJ SOLN
INTRAMUSCULAR | Status: AC
  Filled 2011-08-13: qty 1

## 2011-08-13 MED ORDER — INSULIN ASPART 100 UNIT/ML ~~LOC~~ SOLN: 3.0000 [IU] | Freq: Three times a day (TID) | SUBCUTANEOUS | Status: DC

## 2011-08-13 MED ORDER — DARBEPOETIN ALFA-POLYSORBATE 150 MCG/0.3ML IJ SOLN: 150.0000 ug | INTRAMUSCULAR | Status: DC

## 2011-08-13 MED ORDER — CALCIUM ACETATE 667 MG PO CAPS: 1334.0000 mg | ORAL_CAPSULE | Freq: Three times a day (TID) | ORAL | Status: DC

## 2011-08-13 MED ORDER — PARICALCITOL 5 MCG/ML IV SOLN
INTRAVENOUS | Status: AC
  Filled 2011-08-13: qty 1

## 2011-08-13 NOTE — Discharge Summary (Signed)
[facility] Discharge Summary  Date of Admission: 07/22/2011  2:21 PM Admitter: @ADMITPROV @   Date of Discharge4/20/2013 Attending Physician: Nita Sells, MD  Things to Follow-up on: Please follow up on results from excisional biopsy done  Patient has been discontinued off allopurinol, testosterone. Please reimplement if you see the need. Patient's blood sugars may warrant a increase in his insulin dosage   16 46-year-old man presented to short stay for iron infusion. Was found to have a hemoglobin of 6.7 and referred to the emergency department. There is also some concern of fever upon admission thought to be Viral URI at that time, however fever resolved immediately.  An initial infectious workup was unremarkable. He has Anemia (AOCD) and had PRBC transfusion initially,  he remained hospitalized because of worsening renal failure - when this failed to significantly improved the patient underwent AV graft placement and temporary dialysis catheter placement and was started on hemodialysis April 8.  He developed fever and April 8 for the first time since admission. ID was consulted and after CT scan of abdomen and pelvis it was found that patient had diffuse lymphadenopathy suspicious for lymphoma. He was offered excision biopsy versus CT-guided biopsy by interventional radiology, patient at that time opted not to have excisional biopsy and first give a trial of CT-guided needle biopsy, he was clearly told that this was not the best method of obtaining samples however he at that time was insistent that CT-guided biopsy be given a shot first, he also had some delirium at that time and when this delirium improved and he  agreeable to excision biopsy. I have talked to pathologist on-call on 08/08/2011 he is reviewing the samples with special staining and has sent samples for flow cytometry also. April 16 I discussed the biopsy results with pathologist on-call doctor Shur who recommended  that there is clinical uncertainty with the present specimen, general surgery called for excision biopsy. After excisional biopsy patient can have outpatient followup with him.  Patient seems to have made enough progress to go home with PT/OT and will need close follow-up of the Excisional biopsy performed to determine if he actually does have Lymphoma    Past medical history:   End-stage renal disease secondary to diabetic nephropathy, status post renal transplant 1995, refractory anemia (on Procrit and iron infusion as an outpatient), fibromyalgia.   Consultants:     Nephrology     Vascular surgery   ID   IR   Procedures:     March 30: 2-D echocardiogram: Left ventricular ejection fraction 55-60%. Normal wall motion.     March 29: Transfused 2 units packed blood cells.     April 4: Right arm and central venogram     April 5: Creation of right brachial artery to cephalic vein AV fistula     April 5: Transfusion one unit packed red blood cells.     April 8: Right IJ hemodialysis catheter.   April 12 CT guided Node biopsy by IR   April 16 - discussed the biopsy results with pathologist on-call doctor Shur who recommended that there is clinical uncertainty with the present specimen, general surgery called for excision biopsy.   April 17-excisional biopsy done    Low-grade Fevers: Resolved since admission until recurrence April 8 in the evening. Etiology unclear. On initial presentation it was presumed that his fever was secondary to viral pneumonitis. Influenza was negative. No fever recurred until April 8.  Appreciate Dr. Tommy Medal infectious disease input, CT A&P  suggests ? Lymphoma vs reactive lymphadenopathy due to immunosuppression from transplant medications, post US guided biopsy-had excisional biopsy to 4/17 awaiting further workup. Will need follow-up of this and reassessment with Surgical pathology when available-will review  Other medical problems     1.   Acute/chronic renal failure: Hemodialysis initiated April 8. Status post AV fistula April 5. Renal following. Now getting 3 times a week dialysis Tuesday Thursday and Saturday schedule, has failed renal transplant-was done 18 years ago.  Will likely be dialysis dependant and awaiting CLIP prior to d/c home-this has been set up and patient will be presenting to [facility] on Monday 22nd April for further dialysis.  2. Hyperkalemia: Resolved. Secondary to acute renal failure. Not on potassium supplementation, heparin products or ACE-I/ARB.   3. Refractory anemia: Stable. Fecal occult blood negative. Status post total of 5 units packed red blood cells since admission. Currently H&H appears to be stable we'll continue to monitor.  Likely will need erythropoietin replacement and will need to have this done with dialysis  4. Diabetes mellitus: Stable. Adjusted Lantus to twice a day dose with good effect and ISS, closely monitor PM CBGs, as patient developed mild hypoglycemia during hospitalization. t Lantus 12 units twice a day and NovoLog insulin sensitive 3 units 3 times a day prescribed patient on discharge this can be uptitrated as an outpatient  CBG (last 3)   Basename  08/12/11 1715  08/12/11 1143  08/12/11 0743   GLUCAP  153*  244*  241*     6. History of renal transplantation with allograft nephropathy (failed): Per nephrology. Continue cyclosporin and prednisone needs outpt taper which will be done with nephrologist inpput . Now T,TH,Sat dialysis schedule-[facility].  Unclear etiology of whether this may need further transplant. Per nephrologist to determine.  7. Obstructive sleep apnea: Patient refuses CPAP.  Persistently did not wish to wear it during hospitalization we will have this reassessed as an outpatient  8. Mild delirium - waxing and waning, as in #8, patient refused CT brain, stable UA, CXR. + QHS CPAP encouraged again. Reduced narcotics and benzos 07-07-11. Delirium appears to  be improving post medication adjustment-was thought likely secondary to multiple component  Procedures Performed and pertinent labs: Ct Abdomen Pelvis Wo Contrast  08/04/2011  *RADIOLOGY REPORT*  Clinical Data: Recurrent renal failure requiring hemodialysis. History of renal transplant.  CT ABDOMEN AND PELVIS WITHOUT CONTRAST  Technique:  Multidetector CT imaging of the abdomen and pelvis was performed following the standard protocol without intravenous contrast.  Comparison: Right upper quadrant abdominal ultrasound 07/14/2011. Abdominal CT 08/26/2004.  Findings: Images through the lung bases demonstrate cardiomegaly and hemodialysis catheters within the right atrium.  There is vascular congestion and mild bibasilar atelectasis.  No focal abnormalities are seen within the liver, spleen, gallbladder, pancreas or adrenal glands.  The spleen is slightly larger than on the prior CT, but without focal abnormality.  There is severe chronic cortical thinning of the native kidneys bilaterally.  Appearance is unchanged.  The transplanted kidney in the left iliac fossa appears stable without hydronephrosis, significant cortical thinning or surrounding inflammatory change.  The patient has developed multiple enlarged retroperitoneal and mesenteric lymph nodes.  Examples include an 11 mm right retrocrural node on image 16, a mesenteric node measuring 3.6 x 2.3 cm on image 31, a left periaortic node measuring 2.3 x 3.1 cm on image 46 and a 2.0 x 3.8 cm left external iliac node on image 79. In addition, there are several mildly  enlarged lymph nodes within the left groin.  There are postsurgical changes in the right groin without significant adenopathy.  No bowel lesions are identified.  There is no evidence of bowel obstruction.  The bladder contents demonstrate mildly increased density.  There is no evidence of diskitis or other acute osseous abnormality.  IMPRESSION:  1.  Interval development of multi focal lymphadenopathy  suspicious for lymphoma.  Enlarged lymph nodes in the left groin are most accessible for excisional biopsy. 2.  Slight interval increase in splenic size without significant splenomegaly or focal abnormality. 3.  Transplanted kidney in the left iliac fossa has a stable appearance without hydronephrosis or perinephric soft tissue stranding.  Chronic atrophy of the native kidneys appears unchanged. 4.  High density urine within the urinary bladder.  Original Report Authenticated By: Vivia Ewing, M.D.   Dg Chest 2 View  08/02/2011  *RADIOLOGY REPORT*  Clinical Data: History of difficulty breathing.  History of fever.  CHEST - 2 VIEW  Comparison: 07/22/2011.  08/01/2011.  Findings: Right sided dialysis catheter has tips in right atrial region.  There is stable cardiac silhouette enlargement.  There is upper lobe vascular prominence with pulmonary venous hypertension vascular congestion pattern.  No consolidation or definite pleural effusion is seen.  There is slight elevation of the margin of the left hemidiaphragm with minimal basilar atelectasis. On the lateral image there appears to be some posterior atelectasis and patchy infiltrative density without consolidation.  There is minimal degenerative spondylosis.  IMPRESSION: Dialysis catheter in place.  Stable cardiac silhouette enlargement. Pulmonary venous hypertension vascular congestion pattern.  No consolidation or pleural effusion. Left basilar and posterior atelectasis and patchy infiltrative densities without consolidation.  Original Report Authenticated By: Delane Ginger, M.D.   Dg Chest 2 View  07/22/2011  *RADIOLOGY REPORT*  Clinical Data: Fever for 1 week with cough, shortness of breath, hypertension and diabetes  CHEST - 2 VIEW  Comparison: 10/19/2004  Findings: Heart and mediastinal contours are stable with an unchanged degree of mild cardiomegaly.  The lung fields are notable for mild pulmonary vascular congestion.  No overt congestive failure,  interstitial septal lines or significant peribronchial cuffing is seen to suggest early interstitial edema. A slight increase in interstitial markings are seen diffusely and may indicate underlying viral pneumonitis.  No focal infiltrates are seen to suggest an area of focal bronchopneumonia.  No pleural fluid is seen.  Bony structures appear intact.  IMPRESSION: Mild pulmonary vascular congestion and mild increase in interstitial markings suspicious for viral pneumonitis given history of fever.  No findings suggestive of bronchopneumonia.  Original Report Authenticated By: Ander Gaster, M.D.   US Abdomen Limited  07/14/2011  *RADIOLOGY REPORT*  Clinical Data:  Elevated liver function tests, history of renal transplantation  LIMITED ABDOMINAL ULTRASOUND - RIGHT UPPER QUADRANT  Comparison:  CT abdomen of 08/26/2004  Findings:  Gallbladder:  The gallbladder is visualized and no gallstones are noted.  There is no pain over the gallbladder with compression.  Common bile duct:  The common bile duct is normal measuring 5.0 mm in diameter.  Liver:  The liver is echogenic consistent with fatty infiltration. Also the liver measures 20.1 cm sagittally indicating hepatomegaly. No ductal dilatation is seen.  IMPRESSION:  1.  Fatty infiltration of the liver.  Hepatomegaly. 2.  No gallstones.                   Original Report Authenticated By: Joretta Bachelor, M.D.   US Biopsy  08/05/2011  *RADIOLOGY REPORT*  Clinical history:46 year old with history of renal transplant and fevers.  The patient has lymphadenopathy in the retroperitoneum and left inguinal region.  PROCEDURE(S): ULTRASOUND GUIDED BIOPSY OF LEFT INGUINAL LYMPH NODE.  Physician: Stephan Minister. Henn, MD  Medications:None  Moderate sedation time:None  Fluoroscopy time: None  Procedure:The procedure was explained to the patient.  The risks and benefits of the procedure were discussed and the patient's questions were addressed.  Informed consent was obtained from the  patient.  The left inguinal area was evaluated with ultrasound. Enlarged lymph nodes were identified.  Prominent lymph node anterior to the saphenofemoral junction of the left common femoral vein was targeted.  The left groin was prepped and draped in a sterile fashion.  The skin was anesthetized with 1% lidocaine.  18 gauge core needle was directed into the lymph node with ultrasound guidance.  Five core biopsies were obtained.  Samples placed in saline.  Specimens sent for pathology and microbiology.  Findings:Multiple enlarged left inguinal lymph nodes.  Prominent lymph node anterior to the saphenofemoral junction was biopsied. No significant bleeding following the core biopsies.  Complications: None  Impression:Ultrasound guided core biopsies of a large left inguinal lymph node.  Samples sent for pathology and microbiology.  Original Report Authenticated By: Markus Daft, M.D.   Dg Chest Port 1 View  08/03/2011  *RADIOLOGY REPORT*  Clinical Data: Cough and shortness of breath.  PORTABLE CHEST - 1 VIEW  Comparison: 08/12/2011.  Findings: The diatek catheter is stable.  The heart is enlarged but stable.  The lungs are better aerated with resolving edema.  No effusions.  IMPRESSION: Slight improved lung aeration.  Vascular congestion without pulmonary edema.  Original Report Authenticated By: P. Kalman Jewels, M.D.   Dg Chest Port 1 View  08/01/2011  *RADIOLOGY REPORT*  Clinical Data: Central line placement.  PORTABLE CHEST - 1 VIEW  Comparison: 07/22/2011.  Findings: 1504 hours.  Right IJ dialysis catheter tips are present within the right atrium.  There is stable cardiomegaly with chronic vascular congestion.  No confluent airspace opacity, significant pleural effusion or pneumothorax is seen.  IMPRESSION: Dialysis catheter placement without demonstrated pneumothorax. Stable cardiomegaly and vascular congestion.  Original Report Authenticated By: Vivia Ewing, M.D.    Discharge Vitals & PE:  BP 133/66   Pulse 84  Temp(Src) 97.4 F (36.3 C) (Oral)  Resp 18  Ht 5\' 8"  (1.727 m)  Wt 117.6 kg (259 lb 4.2 oz)  BMI 39.42 kg/m2  SpO2 92%  Awake alert No new F.N deficits, Normal affect  [place].AT,PERRAL  Supple Neck,No JVD, No cervical lymphadenopathy appriciated.  Symmetrical Chest wall movement, Good air movement bilaterally, CTAB  RRR,No Gallops,Rubs or new Murmurs, No Parasternal Heave  +ve B.Sounds, Abd Soft, Non tender, No organomegaly appriciated, No rebound -guarding or rigidity.  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 RLE has some red circumferential areas which are   Discharge Labs:  Results for orders placed during the hospital encounter of 07/22/11 (from the past 24 hour(s))  GLUCOSE, CAPILLARY     Status: Abnormal   Collection Time   08/12/11 11:43 AM      Component Value Range   Glucose-Capillary 244 (*) 70 - 99 (mg/dL)  GLUCOSE, CAPILLARY     Status: Abnormal   Collection Time   08/12/11  5:15 PM      Component Value Range   Glucose-Capillary 153 (*) 70 - 99 (mg/dL)   Comment 1 Documented in Chart  Comment 2 Notify RN    GLUCOSE, CAPILLARY     Status: Abnormal   Collection Time   08/12/11  9:17 PM      Component Value Range   Glucose-Capillary 179 (*) 70 - 99 (mg/dL)    Disposition and follow-up:   Mr.Brolin Belk was discharged from in Good condition.    Follow-up Appointments:  Follow-up Information    Follow up with Tinnie Gens, MD in 4 weeks. (sent)    Contact information:   [address] [place] [phone]       Follow up with Merrie Roof, MD. (As needed)    Contact information:   [facility], [place] [phone]       Follow up with COLADONATO,JOSEPH A, MD. Schedule an appointment as soon as possible for a visit in 1 week.   Contact information:   [place] [phone]           Discharge Medications: Medication List  As of 08/13/2011  7:43 AM   STOP taking these medications         allopurinol 300 MG tablet      ANDROGEL PUMP 20.25 MG/ACT (1.62%) Gel      furosemide 80 MG tablet         TAKE these medications         aspirin 81 MG tablet   Take 81 mg by mouth daily.      calcium acetate 667 MG capsule   Commonly known as: PHOSLO   Take 2 capsules (1,334 mg total) by mouth 3 (three) times daily with meals.      cycloSPORINE modified 100 MG capsule   Commonly known as: NEORAL/GENGRAF   Take 100 mg by mouth 2 (two) times daily.      darbepoetin 150 MCG/0.3ML Soln   Commonly known as: ARANESP   Inject 0.3 mLs (150 mcg total) into the vein every Tuesday with hemodialysis.      Hydrocodone-Acetaminophen 5-300 MG Tabs   Take 1 tablet by mouth Every 6 hours as needed. For pain      insulin aspart 100 UNIT/ML injection   Commonly known as: novoLOG   Inject 3 Units into the skin 3 (three) times daily before meals.      insulin glargine 100 UNIT/ML injection   Commonly known as: LANTUS   Inject 12 Units into the skin 2 (two) times daily.      isosorbide mononitrate 60 MG 24 hr tablet   Commonly known as: IMDUR   Take 60 mg by mouth Daily.      labetalol 300 MG tablet   Commonly known as: NORMODYNE   Take 300 mg by mouth Twice daily.      levothyroxine 75 MCG tablet   Commonly known as: SYNTHROID, LEVOTHROID   Take 75 mg by mouth Daily.      metoCLOPramide 5 MG tablet   Commonly known as: REGLAN   Take 5 mg by mouth 4 (four) times daily - after meals and at bedtime.      paricalcitol 5 MCG/ML injection   Commonly known as: ZEMPLAR   Inject 0.4 mLs (2 mcg total) into the vein Every Tuesday,Thursday,and Saturday with dialysis.      predniSONE 5 MG tablet   Commonly known as: DELTASONE   Take 5 mg by mouth Daily.      promethazine 25  MG tablet   Commonly known as: PHENERGAN   Take 25 mg by mouth Every 6 hours as needed. For nausea       PROVENTIL HFA 108 (90 BASE) MCG/ACT inhaler   Generic drug: albuterol   Inhale 1 puff into the lungs Every 6 hours as needed. For shortness of breath      rOPINIRole 1 MG tablet   Commonly known as: REQUIP   Take 1 mg by mouth Every evening. Take at 2000           Medications Discontinued During This Encounter  Medication Reason  . insulin aspart (novoLOG) injection 0-9 Units Discontinued by provider  . insulin glargine (LANTUS) injection 20 Units   . insulin aspart (novoLOG) injection 3 Units   . insulin aspart (novoLOG) injection 0-20 Units Discontinued by provider  . lidocaine-EPINEPHrine (XYLOCAINE W/EPI) 0.5-1:200000 % (with pres) injection Patient Discharge  . lidocaine-EPINEPHrine (XYLOCAINE W/EPI) 0.5-1:200000 % (with pres) injection Patient Discharge  . 0.9 % irrigation (POUR BTL) Patient Discharge  . heparin 6,000 Units in sodium chloride irrigation 0.9 % 500 mL irrigation Patient Discharge  . 0.9 %  sodium chloride infusion   . droperidol (INAPSINE) injection 0.625 mg   . HYDROmorphone (DILAUDID) injection 0.25-0.5 mg   . insulin glargine (LANTUS) injection 25 Units   . heparin injection Patient Discharge  . lidocaine-EPINEPHrine (XYLOCAINE W/EPI) 0.5-1:200000 % (with pres) injection Patient Discharge  . heparin 6,000 Units in sodium chloride irrigation 0.9 % 500 mL irrigation Patient Discharge  . 0.9 % irrigation (POUR BTL) Patient Discharge  . vancomycin (VANCOCIN) IVPB 1000 mg/200 mL premix Patient Transfer  . HYDROmorphone (DILAUDID) injection 0.25-0.5 mg Patient Transfer  . ondansetron (ZOFRAN) injection 4 mg Patient Transfer  . calcium acetate (PHOSLO) capsule 667 mg   . darbepoetin (ARANESP) 150 MCG/0.3ML injection Returned to ADS  . insulin glargine (LANTUS) injection 30 Units   . insulin aspart (novoLOG) injection 0-5 Units   . insulin aspart (novoLOG) injection 0-9 Units   . furosemide (LASIX) injection 10 mg   . diphenhydrAMINE (BENADRYL) injection 25  mg   . LORazepam (ATIVAN) injection 0.5 mg   . labetalol (NORMODYNE) tablet 300 mg   . insulin aspart (novoLOG) injection 6 Units   . fentaNYL (SUBLIMAZE) 0.05 MG/ML injection Returned to ADS  . midazolam (VERSED) 2 MG/2ML injection Returned to ADS  . midazolam (VERSED) 2 MG/2ML injection Returned to ADS  . guaiFENesin-dextromethorphan (ROBITUSSIN DM) 100-10 MG/5ML syrup 5 mL   . ALPRAZolam (XANAX) tablet 0.5 mg   . HYDROmorphone (DILAUDID) injection 1 mg   . promethazine (PHENERGAN) tablet 25 mg   . HYDROcodone-acetaminophen (NORCO) 5-325 MG per tablet 1 tablet   . insulin glargine (LANTUS) injection 15 Units   . insulin aspart (novoLOG) injection 8 Units   . insulin aspart (novoLOG) injection 0-15 Units   . insulin aspart (novoLOG) injection 0-9 Units   . bupivacaine-EPINEPHrine (MARCAINE W/ EPI) 0.5 % (with pres) injection Patient Discharge  . 0.9 % irrigation (POUR BTL) Patient Discharge  . insulin aspart (novoLOG) injection 0-9 Units Duplicate  . HYDROmorphone (DILAUDID) injection 0.25-0.5 mg No longer needed (for PRN medications)  . heparin injection 0000000 Units Duplicate  . heparin injection 0000000 Units Duplicate  . heparin injection 0000000 Units Duplicate  . heparin injection 123XX123 Units Duplicate  . heparin injection 123XX123 Units Duplicate  . NOVOLOG 100 UNIT/ML injection   . ANDROGEL PUMP 20.25 MG/ACT (1.62%) GEL Stop Taking at Discharge  . furosemide (LASIX) 80  MG tablet Stop Taking at Discharge  . allopurinol (ZYLOPRIM) 300 MG tablet Stop Taking at Discharge   > 40 minutes time spent preparing d/c summary, including direct face-face patient Time, contact with consultants, family and care coordination   Signed: Clarrissa Shimkus,JAI 08/13/2011, 7:43 AM

## 2011-08-13 NOTE — Progress Notes (Signed)
Patient ID: Petronilo Layman, male   DOB: 20-Oct-1965, 46 y.o.   MRN: [id number]  [facility] Progress Note    Subjective:   Patient was seen on dialysis and the procedure was supervised. BFR 400 Via PC BP is 141/70.  Patient appears to be tolerating treatment well, no complaints    Objective:   BP 119/57  Pulse 82  Temp(Src) 97.4 F (36.3 C) (Oral)  Resp 18  Ht 5\' 8"  (1.727 m)  Wt 117.6 kg (259 lb 4.2 oz)  BMI 39.42 kg/m2  SpO2 92%  Physical Exam: Gen:WD WN obese WM inNAD CVS:RRR Resp:CTA [id number] RUE edema, RUE AVF +T/B  Labs: BMET  Lab 08/13/11 0700 08/11/11 0500 08/10/11 0803 08/09/11 0839  NA 130* 126* 131* 128*  K 4.1 4.4 3.8 4.2  CL 94* 91* 95* 91*  CO2 27 24 28 23   GLUCOSE 243* 305* 261* 289*  BUN 29* 39* 31* 66*  CREATININE 3.15* 3.86* 3.09* 3.63*  ALB -- -- -- --  CALCIUM 8.8 8.5 8.5 8.7  PHOS 3.8 5.3* 4.2 4.9*   CBC  Lab 08/11/11 0500 08/10/11 0803 08/09/11 0839  WBC 7.0 5.1 8.0  NEUTROABS -- -- --  HGB 9.3* 9.1* 8.9*  HCT 31.0* 30.4* 29.4*  MCV 89.3 89.9 87.0  PLT 370 316 381    @IMGRELPRIORS @ Medications:      . benzonatate  100 mg Oral TID  . calcium acetate  1,334 mg Oral TID WC  . cycloSPORINE modified  100 mg Oral BID  . darbepoetin (ARANESP) injection - DIALYSIS  150 mcg Intravenous Q Tue-HD  . diphenhydrAMINE  25 mg Oral QHS,MR X 1  . docusate sodium  100 mg Oral BID  . HYDROmorphone      . insulin aspart  0-5 Units Subcutaneous QHS  . insulin aspart  0-9 Units Subcutaneous TID WC  . insulin aspart  3 Units Subcutaneous TID WC  . insulin glargine  12 Units Subcutaneous BID  . isosorbide mononitrate  60 mg Oral Daily  . labetalol  100 mg Oral BID  . levothyroxine  75 mcg Oral QAC breakfast  . metoCLOPramide  5 mg Oral TID PC & HS  . multivitamin  1 tablet Oral Daily  . pantoprazole  40 mg Oral Q1200  . paricalcitol      . paricalcitol  2 mcg Intravenous Q T,Th,Sa-HD  . polyethylene glycol  17 g Oral  Daily  . predniSONE  5 mg Oral Q breakfast  . rOPINIRole  1 mg Oral QHS  . sodium chloride  3 mL Intravenous Q12H     Assessment/ Plan:   1. Deconditioning- cont with HHC PT/OT  2. ESRD- cont TTS HD  3. Failed kidney transplant- will slowly taper immunosuppressive agents as an outpt, cont with cyclosporine 100 bid for now but then will decrease to 75bid at unit. 4. Anemia:cont with Epo  5. Vascular Access- PC and maturing AVF f/u with [facility]  6. Nutrition:cont with prot intake and carb mod diet  7. Hypertension:stable  8. Dispo- for d/c today after HD and f/u on Monday at [facility] to sign papers at [place] 08/13/2011, 10:05 AM

## 2011-08-14 ENCOUNTER — Other Ambulatory Visit: Payer: Self-pay | Admitting: Oncology

## 2011-08-19 ENCOUNTER — Telehealth (INDEPENDENT_AMBULATORY_CARE_PROVIDER_SITE_OTHER): Payer: Self-pay | Admitting: General Surgery

## 2011-08-19 LAB — EPSTEIN-BARR VIRUS VCA ANTIBODY PANEL

## 2011-08-19 NOTE — Telephone Encounter (Signed)
PT'S WIFE CALLED TO CHECK ON PATHOLOGY RESULTS/ REPORT FAXED FROM PATHOLOGY AND REVIEWED WITH DR. TOTH IN OR. PT NOTIFIED OF RESULTS AND REPORT FAXED TO DR. COLADONATO/ [id number]. GY

## 2011-08-22 ENCOUNTER — Emergency Department (HOSPITAL_COMMUNITY)
Admission: EM | Admit: 2011-08-22 | Discharge: 2011-08-22 | Disposition: A | Discharge status: Home / Self Care | Payer: Medicare Other | Attending: Emergency Medicine | Admitting: Emergency Medicine

## 2011-08-22 ENCOUNTER — Encounter: Payer: Self-pay | Admitting: Vascular Surgery

## 2011-08-22 ENCOUNTER — Encounter (HOSPITAL_COMMUNITY): Payer: Self-pay | Admitting: *Deleted

## 2011-08-22 ENCOUNTER — Inpatient Hospital Stay (HOSPITAL_COMMUNITY)
Admission: EM | Admit: 2011-08-22 | Discharge: 2011-08-26 | DRG: 391 | Disposition: A | Discharge status: Home / Self Care | Payer: Medicare Other | Attending: Internal Medicine | Admitting: Internal Medicine

## 2011-08-22 ENCOUNTER — Emergency Department (HOSPITAL_COMMUNITY): Payer: Medicare Other

## 2011-08-22 DIAGNOSIS — S88119A Complete traumatic amputation at level between knee and ankle, unspecified lower leg, initial encounter: Secondary | ICD-10-CM

## 2011-08-22 DIAGNOSIS — Z794 Long term (current) use of insulin: Secondary | ICD-10-CM | POA: Insufficient documentation

## 2011-08-22 DIAGNOSIS — R141 Gas pain: Secondary | ICD-10-CM | POA: Insufficient documentation

## 2011-08-22 DIAGNOSIS — D638 Anemia in other chronic diseases classified elsewhere: Secondary | ICD-10-CM | POA: Diagnosis present

## 2011-08-22 DIAGNOSIS — I3139 Other pericardial effusion (noninflammatory): Secondary | ICD-10-CM

## 2011-08-22 DIAGNOSIS — R109 Unspecified abdominal pain: Secondary | ICD-10-CM | POA: Insufficient documentation

## 2011-08-22 DIAGNOSIS — Z79899 Other long term (current) drug therapy: Secondary | ICD-10-CM | POA: Insufficient documentation

## 2011-08-22 DIAGNOSIS — E119 Type 2 diabetes mellitus without complications: Secondary | ICD-10-CM | POA: Insufficient documentation

## 2011-08-22 DIAGNOSIS — S98139A Complete traumatic amputation of one unspecified lesser toe, initial encounter: Secondary | ICD-10-CM

## 2011-08-22 DIAGNOSIS — Z992 Dependence on renal dialysis: Secondary | ICD-10-CM

## 2011-08-22 DIAGNOSIS — J189 Pneumonia, unspecified organism: Secondary | ICD-10-CM | POA: Diagnosis not present

## 2011-08-22 DIAGNOSIS — K5900 Constipation, unspecified: Principal | ICD-10-CM | POA: Diagnosis present

## 2011-08-22 DIAGNOSIS — D649 Anemia, unspecified: Secondary | ICD-10-CM | POA: Diagnosis present

## 2011-08-22 DIAGNOSIS — Z940 Kidney transplant status: Secondary | ICD-10-CM

## 2011-08-22 DIAGNOSIS — I319 Disease of pericardium, unspecified: Secondary | ICD-10-CM | POA: Insufficient documentation

## 2011-08-22 DIAGNOSIS — E1121 Type 2 diabetes mellitus with diabetic nephropathy: Secondary | ICD-10-CM | POA: Diagnosis present

## 2011-08-22 DIAGNOSIS — I5033 Acute on chronic diastolic (congestive) heart failure: Secondary | ICD-10-CM | POA: Diagnosis present

## 2011-08-22 DIAGNOSIS — R0602 Shortness of breath: Secondary | ICD-10-CM | POA: Insufficient documentation

## 2011-08-22 DIAGNOSIS — I509 Heart failure, unspecified: Secondary | ICD-10-CM | POA: Diagnosis present

## 2011-08-22 DIAGNOSIS — R0600 Dyspnea, unspecified: Secondary | ICD-10-CM

## 2011-08-22 DIAGNOSIS — I313 Pericardial effusion (noninflammatory): Secondary | ICD-10-CM

## 2011-08-22 DIAGNOSIS — R0609 Other forms of dyspnea: Secondary | ICD-10-CM | POA: Diagnosis present

## 2011-08-22 DIAGNOSIS — Z9641 Presence of insulin pump (external) (internal): Secondary | ICD-10-CM

## 2011-08-22 DIAGNOSIS — IMO0002 Reserved for concepts with insufficient information to code with codable children: Secondary | ICD-10-CM

## 2011-08-22 DIAGNOSIS — Z7982 Long term (current) use of aspirin: Secondary | ICD-10-CM

## 2011-08-22 DIAGNOSIS — N186 End stage renal disease: Secondary | ICD-10-CM | POA: Diagnosis present

## 2011-08-22 DIAGNOSIS — G4733 Obstructive sleep apnea (adult) (pediatric): Secondary | ICD-10-CM | POA: Diagnosis present

## 2011-08-22 DIAGNOSIS — R142 Eructation: Secondary | ICD-10-CM | POA: Insufficient documentation

## 2011-08-22 HISTORY — DX: Essential (primary) hypertension: I10

## 2011-08-22 LAB — CBC
HCT: 32.7 % — ABNORMAL LOW (ref 39.0–52.0)
Hemoglobin: 9.8 g/dL — ABNORMAL LOW (ref 13.0–17.0)
MCH: 27.4 pg (ref 26.0–34.0)
MCHC: 30 g/dL (ref 30.0–36.0)
MCV: 91.3 fL (ref 78.0–100.0)
Platelets: 238 10*3/uL (ref 150–400)
RBC: 3.58 MIL/uL — ABNORMAL LOW (ref 4.22–5.81)
RDW: 19.3 % — ABNORMAL HIGH (ref 11.5–15.5)
WBC: 8.1 10*3/uL (ref 4.0–10.5)

## 2011-08-22 LAB — DIFFERENTIAL
Basophils Absolute: 0.1 10*3/uL (ref 0.0–0.1)
Basophils Relative: 1 % (ref 0–1)
Eosinophils Absolute: 0.7 10*3/uL (ref 0.0–0.7)
Eosinophils Relative: 9 % — ABNORMAL HIGH (ref 0–5)
Lymphocytes Relative: 23 % (ref 12–46)
Lymphs Abs: 1.9 10*3/uL (ref 0.7–4.0)
Monocytes Absolute: 0.8 10*3/uL (ref 0.1–1.0)
Monocytes Relative: 10 % (ref 3–12)
Neutro Abs: 4.6 10*3/uL (ref 1.7–7.7)
Neutrophils Relative %: 57 % (ref 43–77)

## 2011-08-22 LAB — BASIC METABOLIC PANEL
BUN: 19 mg/dL (ref 6–23)
CO2: 30 mEq/L (ref 19–32)
Calcium: 9.4 mg/dL (ref 8.4–10.5)
Chloride: 98 mEq/L (ref 96–112)
Creatinine, Ser: 2.15 mg/dL — ABNORMAL HIGH (ref 0.50–1.35)
GFR calc Af Amer: 41 mL/min — ABNORMAL LOW (ref >90)
GFR calc non Af Amer: 35 mL/min — ABNORMAL LOW (ref >90)
Glucose, Bld: 98 mg/dL (ref 70–99)
Potassium: 4 mEq/L (ref 3.5–5.1)
Sodium: 134 mEq/L — ABNORMAL LOW (ref 135–145)

## 2011-08-22 LAB — POCT I-STAT TROPONIN I: Troponin i, poc: 0.01 ng/mL (ref 0.00–0.08)

## 2011-08-22 LAB — GLUCOSE, CAPILLARY
Glucose-Capillary: 53 mg/dL — ABNORMAL LOW (ref 70–99)
Glucose-Capillary: 71 mg/dL (ref 70–99)
Glucose-Capillary: 114 mg/dL — ABNORMAL HIGH (ref 70–99)

## 2011-08-22 MED ORDER — IOHEXOL 300 MG/ML  SOLN: 20.0000 mL | INTRAMUSCULAR | Status: AC

## 2011-08-22 MED ORDER — LORAZEPAM 2 MG/ML IJ SOLN
0.2500 mg | Freq: Once | INTRAMUSCULAR | Status: AC
  Administered 2011-08-22: 0.26 mg via INTRAVENOUS
  Filled 2011-08-22: qty 1

## 2011-08-22 MED ORDER — DEXTROSE 50 % IV SOLN
INTRAVENOUS | Status: AC
  Administered 2011-08-22: 25 mL
  Filled 2011-08-22: qty 50

## 2011-08-22 MED ORDER — LIDOCAINE VISCOUS 2 % MT SOLN
OROMUCOSAL | Status: AC
  Filled 2011-08-22: qty 15

## 2011-08-22 MED ORDER — MORPHINE SULFATE 4 MG/ML IJ SOLN
4.0000 mg | Freq: Once | INTRAMUSCULAR | Status: DC
  Filled 2011-08-22: qty 1

## 2011-08-22 MED ORDER — GLUCOSE-VITAMIN C 4-6 GM-MG PO CHEW
CHEWABLE_TABLET | ORAL | Status: AC
  Administered 2011-08-22: 6
  Filled 2011-08-22: qty 1

## 2011-08-22 MED ORDER — KETOROLAC TROMETHAMINE 15 MG/ML IJ SOLN: 15.0000 mg | Freq: Once | INTRAMUSCULAR | Status: DC

## 2011-08-22 MED ORDER — BISACODYL 10 MG RE SUPP: 10.0000 mg | RECTAL | Status: DC | PRN

## 2011-08-22 MED ORDER — HYDROMORPHONE HCL PF 1 MG/ML IJ SOLN
0.5000 mg | Freq: Once | INTRAMUSCULAR | Status: AC
  Administered 2011-08-22: 0.5 mg via INTRAVENOUS
  Filled 2011-08-22: qty 1

## 2011-08-22 MED ORDER — LACTULOSE 20 G PO PACK: 20.0000 g | PACK | Freq: Three times a day (TID) | ORAL | Status: DC

## 2011-08-22 MED ORDER — KETOROLAC TROMETHAMINE 30 MG/ML IJ SOLN
INTRAMUSCULAR | Status: AC
  Administered 2011-08-22: 15 mg
  Filled 2011-08-22: qty 1

## 2011-08-22 MED ORDER — OXYCODONE-ACETAMINOPHEN 5-325 MG PO TABS
1.0000 | ORAL_TABLET | Freq: Once | ORAL | Status: AC
  Administered 2011-08-22: 1 via ORAL
  Filled 2011-08-22: qty 1

## 2011-08-22 NOTE — ED Notes (Signed)
PT refused Morphine because it causes nausea.  PT reported Dilaudid does not make him nauseated.

## 2011-08-22 NOTE — ED Provider Notes (Signed)
Medical screening examination/treatment/procedure(s) were performed by non-physician practitioner and as supervising physician I was immediately available for consultation/collaboration.   Lezlie Octave, MD 08/22/11 [phone]

## 2011-08-22 NOTE — ED Provider Notes (Signed)
Medical screening examination/treatment/procedure(s) were performed by non-physician practitioner and as supervising physician I was immediately available for consultation/collaboration.   Lezlie Octave, MD 08/22/11 [phone]

## 2011-08-22 NOTE — ED Notes (Signed)
Spoke with PA [place], will suction pt for approx 30 min on intermittent suction to relieve pressure in abdomen, then will give PO contrast through NG tube over the next 2 hours, CT is aware of POC

## 2011-08-22 NOTE — ED Provider Notes (Signed)
Pt presented to ED w/ abd pain/distension and constipation.  There is concern for SBO.  NG tube placed and pt moved to CDU for CT abd/pelvis.  He has completed his contrast through NG tube.  On re-examination, NAD, VSS, abd distended, mild tenderness and firmness of lower half of abd.  He has received IV dilaudid for pain.  Will reassess shortly.  12:00 PM  CT shows small pericardial and left pleural effusion but is negative for acute intra-abdominal pathology.  Results discussed w/ pt.  His pain and distension are likely related to constipation.  He denies recent chest pain and cough.  Had single, self-limited episode of SOB at rest yesterday that resolved when he began to breath through nose and out mouth.  Consulted Dr. Einar Gip and small pericardial effusion is not uncommon in a dialysis patient.  He is happy to f/u with him on an outpatient basis.  Pt referred to both cardiology and GI.  D/c'd home w/ bisacodyl and lactulose to take simultaneously for the next two days.  He has miralax at home as well.  Recommended that he return if pain worsens, he develops CP or fever.    [place], [place] 08/22/11 1610

## 2011-08-22 NOTE — ED Notes (Signed)
approx 50cc of contrast given through NG tube, pt began feeling full at the 50cc mark, will given more in 44min

## 2011-08-22 NOTE — ED Notes (Signed)
Pt taking glucose tabs, IV attempted x 1, will have another RN check, pt has turned off insulin pump, PA made aware, pt on monitor

## 2011-08-22 NOTE — ED Notes (Signed)
Paged IV Team to access port.

## 2011-08-22 NOTE — ED Notes (Signed)
Patient transported to X-ray 

## 2011-08-22 NOTE — ED Notes (Signed)
Pt states he was recently admitted for "kidney issues" and is on pain medication.  States he has not had BM in 2 weeks, c/o abdominal pain.

## 2011-08-22 NOTE — Discharge Instructions (Signed)
Take bisacodyl and lactulose today and then again tomorrow if you have not yet had a bowel movement.  Call [facility] [phone]) if you do not have a primary care doctor and would like assistance with finding one.   Call Dr. Einar Gip to schedule a follow up appointment to have your heart rechecked.   Follow up with the GI physician as well.   You should return to the ER if your symptoms worsen or you develop associated fever or chest pain.

## 2011-08-22 NOTE — ED Notes (Addendum)
Pt seen here today for possible bowel obstruction. He did not have one. He was sent home with medication for it and he took one dose of each. He reports he is still having pain in belly; Reports the more he tries to go to bathroom and push it out he has shortness of breath. Pt is SOB upon rest and belly is distended; EKG NSR.

## 2011-08-22 NOTE — ED Provider Notes (Addendum)
History     CSN: [id number]  Arrival date & time 08/22/11  [id number]   First MD Initiated Contact with Patient 08/22/11 [phone]      Chief Complaint  Patient presents with  . Constipation    (Consider location/radiation/quality/duration/timing/severity/associated sxs/prior treatment) Patient is a 46 y.o. male presenting with constipation. The history is provided by the patient.  Constipation  The current episode started more than 2 weeks ago. The problem has been unchanged. The pain is moderate. The stool is described as liquid. Associated symptoms include abdominal pain. Pertinent negatives include no fever and no chest pain. Associated symptoms comments: He reports that for the last one month he has had bowel movements limited to 3 watery episodes, without blood. No fever. He has increasing abdominal distention, now with increasing shortness of breath that is worse when supine. He is sleeping at an incline at this point. He passes very little flatus. No fever. He has a history of renal transplant, ischemic colitis.. His past medical history is significant for abdominal surgery.    Past Medical History  Diagnosis Date  . Renal disease   . Diabetes mellitus     Past Surgical History  Procedure Date  . Appendectomy   . Hernia repair   . Kidney transplant   . Cataract extraction   . Arteriovenous graft placement   . Av fistula placement   . Toe amputation   . Below knee leg amputation   . Carpal tunnel release   . Shoulder surgery   . Av fistula placement 07/29/2011    Procedure: ARTERIOVENOUS (AV) FISTULA CREATION;  Surgeon: Mal Misty, MD;  Location: [facility];  Service: Vascular;  Laterality: Right;  Brachial cephalic  . Insertion of dialysis catheter 08/01/2011    Procedure: INSERTION OF DIALYSIS CATHETER;  Surgeon: Rosetta Posner, MD;  Location: [facility];  Service: Vascular;  Laterality: Right;  insertion of dialysis catheter on right internal jugular vein  . Lymph node biopsy 08/10/2011      Procedure: LYMPH NODE BIOPSY;  Surgeon: Merrie Roof, MD;  Location: [facility];  Service: General;  Laterality: Left;  left groin lymph biopsy    History reviewed. No pertinent family history.  History  Substance Use Topics  . Smoking status: Never Smoker   . Smokeless tobacco: Not on file  . Alcohol Use: Yes      Review of Systems  Constitutional: Negative for fever and chills.  Respiratory: Positive for shortness of breath.   Cardiovascular: Negative for chest pain.  Gastrointestinal: Positive for abdominal pain, constipation and abdominal distention.  Genitourinary: Negative for dysuria and testicular pain.  Musculoskeletal: Negative for back pain.    Allergies  Amoxicillin; Ciprofloxacin; Clindamycin/lincomycin; Codeine; Levaquin; and Vasotec  Home Medications   Current Outpatient Rx  Name Route Sig Dispense Refill  . ASPIRIN 81 MG PO TABS Oral Take 81 mg by mouth daily.    [name] Kitchen CALCIUM ACETATE 667 MG PO CAPS Oral Take 2 capsules (1,334 mg total) by mouth 3 (three) times daily with meals. 90 capsule 0  . CYCLOSPORINE MODIFIED 100 MG PO CAPS Oral Take 100 mg by mouth 2 (two) times daily. Neoral brand    . PROCRIT IJ Injection Inject 1 each as directed once a week. Takes on Tuesday in dialysis-please refer to HD records for dose    . HYDROCODONE-ACETAMINOPHEN 5-300 MG PO TABS Oral Take 1 tablet by mouth Every 6 hours as needed. For pain    . INSULIN PUMP  Subcutaneous Inject 1 each into the skin continuous. Basal rate is 1.2-1.9 units. Varies with time of day/per patient's device.    . ISOSORBIDE MONONITRATE ER 60 MG PO TB24 Oral Take 60 mg by mouth Daily.    [name] Kitchen LABETALOL HCL 300 MG PO TABS Oral Take 300 mg by mouth Twice daily.    [name] Kitchen LEVOTHYROXINE SODIUM 75 MCG PO TABS Oral Take 75 mg by mouth Daily.    [name] Kitchen METOCLOPRAMIDE HCL 5 MG PO TABS Oral Take 5 mg by mouth 4 (four) times daily - after meals and at bedtime.    [name] Kitchen PREDNISONE 5 MG PO TABS Oral Take 5 mg by mouth Daily.    [name] Kitchen  PROMETHAZINE HCL 25 MG PO TABS Oral Take 25 mg by mouth Every 6 hours as needed. For nausea    . PROVENTIL HFA 108 (90 BASE) MCG/ACT IN AERS Inhalation Inhale 1 puff into the lungs Every 6 hours as needed. For shortness of breath    . RANITIDINE HCL 75 MG PO TABS Oral Take 75 mg by mouth daily.    [name] Kitchen ROPINIROLE HCL 1 MG PO TABS Oral Take 1 mg by mouth Every evening. Take at 2000    . PARICALCITOL 5 MCG/ML IV SOLN Intravenous Inject 0.4 mLs (2 mcg total) into the vein Every Tuesday,Thursday,and Saturday with dialysis. 1 mL 0    BP 142/64  Pulse 67  Temp(Src) 97.8 F (36.6 C) (Oral)  Resp 15  SpO2 100%  Physical Exam  Constitutional: He is oriented to person, place, and time. He appears well-developed and well-nourished.       Uncomfortable appearing.  HENT:  Mouth/Throat: Oropharynx is clear and moist.  Cardiovascular: Normal rate and regular rhythm.   No murmur heard. Pulmonary/Chest: Effort normal. He has no wheezes. He has no rales.  Abdominal: Soft. He exhibits no distension. There is tenderness. There is no rebound and no guarding.       Generally tender, somewhat worse in LLQ. Multiple abdominal well healed surgical scars.  Musculoskeletal: Normal range of motion. He exhibits no edema.       Right BKA.  Neurological: He is alert and oriented to person, place, and time.  Skin: Skin is warm and dry.  Psychiatric: He has a normal mood and affect.    ED Course  Procedures (including critical care time)   Labs Reviewed  CBC  DIFFERENTIAL  BASIC METABOLIC PANEL   Results for orders placed during the hospital encounter of 08/22/11  CBC      Component Value Range   WBC 8.1  4.0 - 10.5 (K/uL)   RBC 3.58 (*) 4.22 - 5.81 (MIL/uL)   Hemoglobin 9.8 (*) 13.0 - 17.0 (g/dL)   HCT 32.7 (*) 39.0 - 52.0 (%)   MCV 91.3  78.0 - 100.0 (fL)   MCH 27.4  26.0 - 34.0 (pg)   MCHC 30.0  30.0 - 36.0 (g/dL)   RDW 19.3 (*) 11.5 - 15.5 (%)   Platelets 238  150 - 400 (K/uL)  DIFFERENTIAL       Component Value Range   Neutrophils Relative 57  43 - 77 (%)   Neutro Abs 4.6  1.7 - 7.7 (K/uL)   Lymphocytes Relative 23  12 - 46 (%)   Lymphs Abs 1.9  0.7 - 4.0 (K/uL)   Monocytes Relative 10  3 - 12 (%)   Monocytes Absolute 0.8  0.1 - 1.0 (K/uL)   Eosinophils Relative 9 (*) 0 - 5 (%)  Eosinophils Absolute 0.7  0.0 - 0.7 (K/uL)   Basophils Relative 1  0 - 1 (%)   Basophils Absolute 0.1  0.0 - 0.1 (K/uL)  BASIC METABOLIC PANEL      Component Value Range   Sodium 134 (*) 135 - 145 (mEq/L)   Potassium 4.0  3.5 - 5.1 (mEq/L)   Chloride 98  96 - 112 (mEq/L)   CO2 30  19 - 32 (mEq/L)   Glucose, Bld 98  70 - 99 (mg/dL)   BUN 19  6 - 23 (mg/dL)   Creatinine, Ser 2.15 (*) 0.50 - 1.35 (mg/dL)   Calcium 9.4  8.4 - 10.5 (mg/dL)   GFR calc non Af Amer 35 (*) >90 (mL/min)   GFR calc Af Amer 41 (*) >90 (mL/min)    No results found.   No diagnosis found.    MDM  Patient presents clinically as SBO with symptoms of decreased/absent bowel movements, increasing abdominal distention now causing SOB and orthopnea, and risk factors of multiple abdominal surgeries. Plain film indeterminate but raises suspicion further. Patient given the option of NG tube placement and agrees. Will order CT (PO CM only) for further diagnosis and anticipate admission. Move to CDU.        Leotis Shames, PA-C 08/22/11 0901  Leotis Shames, PA-C 12/21/11 1348

## 2011-08-22 NOTE — ED Notes (Signed)
CBG checked due to pt stating he wasn't feeling well, CBG 53, PA made aware, will give pt glucose tablets due to no IV access

## 2011-08-23 ENCOUNTER — Emergency Department (HOSPITAL_COMMUNITY): Payer: Medicare Other

## 2011-08-23 ENCOUNTER — Inpatient Hospital Stay (HOSPITAL_COMMUNITY): Payer: Medicare Other

## 2011-08-23 ENCOUNTER — Encounter (HOSPITAL_COMMUNITY): Payer: Self-pay

## 2011-08-23 ENCOUNTER — Ambulatory Visit: Payer: Medicare Other | Admitting: Vascular Surgery

## 2011-08-23 DIAGNOSIS — K5900 Constipation, unspecified: Secondary | ICD-10-CM | POA: Diagnosis present

## 2011-08-23 LAB — IRON AND TIBC
Iron: 19 ug/dL — ABNORMAL LOW (ref 42–135)
Saturation Ratios: 18 % — ABNORMAL LOW (ref 20–55)
TIBC: 105 ug/dL — ABNORMAL LOW (ref 215–435)
UIBC: 86 ug/dL — ABNORMAL LOW (ref 125–400)

## 2011-08-23 LAB — GLUCOSE, CAPILLARY
Glucose-Capillary: 38 mg/dL — CL (ref 70–99)
Glucose-Capillary: 41 mg/dL — CL (ref 70–99)
Glucose-Capillary: 95 mg/dL (ref 70–99)
Glucose-Capillary: 81 mg/dL (ref 70–99)
Glucose-Capillary: 95 mg/dL (ref 70–99)
Glucose-Capillary: 228 mg/dL — ABNORMAL HIGH (ref 70–99)
Glucose-Capillary: 144 mg/dL — ABNORMAL HIGH (ref 70–99)
Glucose-Capillary: 157 mg/dL — ABNORMAL HIGH (ref 70–99)

## 2011-08-23 LAB — CBC
HCT: 33.6 % — ABNORMAL LOW (ref 39.0–52.0)
Hemoglobin: 10.1 g/dL — ABNORMAL LOW (ref 13.0–17.0)
MCH: 27.4 pg (ref 26.0–34.0)
MCHC: 30.1 g/dL (ref 30.0–36.0)
MCV: 91.1 fL (ref 78.0–100.0)
Platelets: 200 10*3/uL (ref 150–400)
RBC: 3.69 MIL/uL — ABNORMAL LOW (ref 4.22–5.81)
RDW: 19.4 % — ABNORMAL HIGH (ref 11.5–15.5)
WBC: 8.2 10*3/uL (ref 4.0–10.5)

## 2011-08-23 LAB — POCT I-STAT, CHEM 8
BUN: 21 mg/dL (ref 6–23)
Calcium, Ion: 1.14 mmol/L (ref 1.12–1.32)
Chloride: 99 mEq/L (ref 96–112)
Creatinine, Ser: 2.1 mg/dL — ABNORMAL HIGH (ref 0.50–1.35)
Glucose, Bld: 124 mg/dL — ABNORMAL HIGH (ref 70–99)
HCT: 31 % — ABNORMAL LOW (ref 39.0–52.0)
Hemoglobin: 10.5 g/dL — ABNORMAL LOW (ref 13.0–17.0)
Potassium: 4.4 mEq/L (ref 3.5–5.1)
Sodium: 134 mEq/L — ABNORMAL LOW (ref 135–145)
TCO2: 28 mmol/L (ref 0–100)

## 2011-08-23 LAB — HEMOGLOBIN A1C
Hgb A1c MFr Bld: 6.8 % — ABNORMAL HIGH (ref <5.7)
Mean Plasma Glucose: 148 mg/dL — ABNORMAL HIGH (ref <117)

## 2011-08-23 LAB — FERRITIN: Ferritin: 872 ng/mL — ABNORMAL HIGH (ref 22–322)

## 2011-08-23 LAB — POCT I-STAT TROPONIN I: Troponin i, poc: 0.01 ng/mL (ref 0.00–0.08)

## 2011-08-23 LAB — MRSA PCR SCREENING: MRSA by PCR: NEGATIVE

## 2011-08-23 MED ORDER — ACETAMINOPHEN 325 MG PO TABS: 650.0000 mg | ORAL_TABLET | Freq: Four times a day (QID) | ORAL | Status: DC | PRN

## 2011-08-23 MED ORDER — SENNOSIDES-DOCUSATE SODIUM 8.6-50 MG PO TABS
2.0000 | ORAL_TABLET | Freq: Every day | ORAL | Status: DC
  Administered 2011-08-23 – 2011-08-26 (×4): 2 via ORAL
  Filled 2011-08-23 (×4): qty 2

## 2011-08-23 MED ORDER — ONDANSETRON HCL 4 MG/2ML IJ SOLN
4.0000 mg | Freq: Once | INTRAMUSCULAR | Status: AC
  Administered 2011-08-23: 4 mg via INTRAVENOUS
  Filled 2011-08-23: qty 2

## 2011-08-23 MED ORDER — INSULIN ASPART 100 UNIT/ML ~~LOC~~ SOLN
0.0000 [IU] | Freq: Every day | SUBCUTANEOUS | Status: DC
  Administered 2011-08-25: 2 [IU] via SUBCUTANEOUS

## 2011-08-23 MED ORDER — INSULIN ASPART 100 UNIT/ML ~~LOC~~ SOLN
0.0000 [IU] | Freq: Three times a day (TID) | SUBCUTANEOUS | Status: DC
  Administered 2011-08-23: 3 [IU] via SUBCUTANEOUS
  Administered 2011-08-24: 5 [IU] via SUBCUTANEOUS
  Administered 2011-08-24 – 2011-08-25 (×2): 2 [IU] via SUBCUTANEOUS
  Administered 2011-08-26: 3 [IU] via SUBCUTANEOUS
  Administered 2011-08-26: 5 [IU] via SUBCUTANEOUS

## 2011-08-23 MED ORDER — FLEET ENEMA 7-19 GM/118ML RE ENEM: 1.0000 | ENEMA | Freq: Once | RECTAL | Status: DC

## 2011-08-23 MED ORDER — LEVOTHYROXINE SODIUM 75 MCG PO TABS
75.0000 ug | ORAL_TABLET | Freq: Every day | ORAL | Status: DC
  Administered 2011-08-23 – 2011-08-26 (×3): 75 ug via ORAL
  Filled 2011-08-23 (×6): qty 1

## 2011-08-23 MED ORDER — SODIUM CHLORIDE 0.9 % IV SOLN
INTRAVENOUS | Status: DC
  Administered 2011-08-23: 02:00:00 via INTRAVENOUS

## 2011-08-23 MED ORDER — METOCLOPRAMIDE HCL 5 MG PO TABS
5.0000 mg | ORAL_TABLET | Freq: Three times a day (TID) | ORAL | Status: DC
  Administered 2011-08-23 – 2011-08-26 (×9): 5 mg via ORAL
  Filled 2011-08-23 (×17): qty 1

## 2011-08-23 MED ORDER — HEPARIN SODIUM (PORCINE) 1000 UNIT/ML DIALYSIS
1000.0000 [IU] | INTRAMUSCULAR | Status: DC | PRN
  Filled 2011-08-23: qty 1

## 2011-08-23 MED ORDER — SODIUM CHLORIDE 0.9 % IV SOLN: 100.0000 mL | INTRAVENOUS | Status: DC | PRN

## 2011-08-23 MED ORDER — LACTULOSE 10 GM/15ML PO SOLN
30.0000 g | Freq: Once | ORAL | Status: DC
Start: 2011-08-23 — End: 2011-08-23
  Filled 2011-08-23: qty 45

## 2011-08-23 MED ORDER — SODIUM CHLORIDE 0.9 % IJ SOLN: 3.0000 mL | Freq: Two times a day (BID) | INTRAMUSCULAR | Status: DC

## 2011-08-23 MED ORDER — HYDROMORPHONE HCL PF 1 MG/ML IJ SOLN
1.0000 mg | INTRAMUSCULAR | Status: DC | PRN
  Administered 2011-08-23 – 2011-08-26 (×14): 1 mg via INTRAVENOUS
  Filled 2011-08-23 (×14): qty 1

## 2011-08-23 MED ORDER — ALUM & MAG HYDROXIDE-SIMETH 200-200-20 MG/5ML PO SUSP: 30.0000 mL | Freq: Four times a day (QID) | ORAL | Status: DC | PRN

## 2011-08-23 MED ORDER — ASPIRIN EC 81 MG PO TBEC
81.0000 mg | DELAYED_RELEASE_TABLET | Freq: Every day | ORAL | Status: DC
  Administered 2011-08-23 – 2011-08-26 (×4): 81 mg via ORAL
  Filled 2011-08-23 (×4): qty 1

## 2011-08-23 MED ORDER — MAGNESIUM CITRATE PO SOLN
1.0000 | Freq: Once | ORAL | Status: DC
Start: 2011-08-23 — End: 2011-08-23
  Administered 2011-08-23: 1 via ORAL
  Filled 2011-08-23: qty 296

## 2011-08-23 MED ORDER — LACTULOSE 20 G PO PACK
20.0000 g | PACK | Freq: Three times a day (TID) | ORAL | Status: DC
Start: 2011-08-23 — End: 2011-08-23

## 2011-08-23 MED ORDER — HYDROMORPHONE HCL PF 1 MG/ML IJ SOLN
1.0000 mg | Freq: Once | INTRAMUSCULAR | Status: DC
  Filled 2011-08-23: qty 1

## 2011-08-23 MED ORDER — PARICALCITOL 5 MCG/ML IV SOLN
2.0000 ug | INTRAVENOUS | Status: DC
  Administered 2011-08-23 – 2011-08-25 (×2): 2 ug via INTRAVENOUS
  Filled 2011-08-23 (×2): qty 0.4

## 2011-08-23 MED ORDER — ONDANSETRON HCL 4 MG PO TABS: 4.0000 mg | ORAL_TABLET | Freq: Four times a day (QID) | ORAL | Status: DC | PRN

## 2011-08-23 MED ORDER — CYCLOSPORINE MODIFIED (NEORAL) 100 MG PO CAPS
100.0000 mg | ORAL_CAPSULE | Freq: Two times a day (BID) | ORAL | Status: DC
  Administered 2011-08-23 – 2011-08-25 (×6): 100 mg via ORAL
  Filled 2011-08-23 (×8): qty 1

## 2011-08-23 MED ORDER — ISOSORBIDE MONONITRATE ER 60 MG PO TB24
60.0000 mg | ORAL_TABLET | Freq: Every day | ORAL | Status: DC
  Administered 2011-08-23 – 2011-08-26 (×4): 60 mg via ORAL
  Filled 2011-08-23 (×4): qty 1

## 2011-08-23 MED ORDER — INSULIN GLARGINE 100 UNIT/ML ~~LOC~~ SOLN
25.0000 [IU] | Freq: Every day | SUBCUTANEOUS | Status: DC
  Administered 2011-08-23: 11:00:00 via SUBCUTANEOUS
  Administered 2011-08-24 – 2011-08-26 (×2): 25 [IU] via SUBCUTANEOUS

## 2011-08-23 MED ORDER — ACETAMINOPHEN 650 MG RE SUPP: 650.0000 mg | Freq: Four times a day (QID) | RECTAL | Status: DC | PRN

## 2011-08-23 MED ORDER — SORBITOL 70 % SOLN
960.0000 mL | TOPICAL_OIL | Freq: Once | ORAL | Status: AC
  Administered 2011-08-24: 960 mL via RECTAL
  Filled 2011-08-23: qty 240

## 2011-08-23 MED ORDER — PREDNISONE 5 MG PO TABS
5.0000 mg | ORAL_TABLET | Freq: Every day | ORAL | Status: DC
  Administered 2011-08-23 – 2011-08-26 (×4): 5 mg via ORAL
  Filled 2011-08-23 (×5): qty 1

## 2011-08-23 MED ORDER — LACTULOSE 10 GM/15ML PO SOLN
20.0000 g | Freq: Three times a day (TID) | ORAL | Status: DC
  Administered 2011-08-23 – 2011-08-25 (×6): 20 g via ORAL
  Filled 2011-08-23 (×12): qty 30

## 2011-08-23 MED ORDER — SODIUM CHLORIDE 0.9 % IJ SOLN: 3.0000 mL | INTRAMUSCULAR | Status: DC | PRN

## 2011-08-23 MED ORDER — HYDROMORPHONE HCL PF 1 MG/ML IJ SOLN
1.0000 mg | Freq: Once | INTRAMUSCULAR | Status: AC
  Administered 2011-08-23: 1 mg via INTRAVENOUS
  Filled 2011-08-23: qty 1

## 2011-08-23 MED ORDER — ONDANSETRON HCL 4 MG/2ML IJ SOLN
INTRAMUSCULAR | Status: AC
  Filled 2011-08-23: qty 2

## 2011-08-23 MED ORDER — SORBITOL 70 % SOLN
30.0000 mL | Freq: Two times a day (BID) | Status: DC
  Administered 2011-08-23 – 2011-08-25 (×4): 30 mL via ORAL
  Filled 2011-08-23 (×8): qty 30

## 2011-08-23 MED ORDER — DARBEPOETIN ALFA-POLYSORBATE 200 MCG/0.4ML IJ SOLN
200.0000 ug | INTRAMUSCULAR | Status: DC
  Administered 2011-08-23: 200 ug via INTRAVENOUS
  Filled 2011-08-23: qty 0.4

## 2011-08-23 MED ORDER — FAMOTIDINE 10 MG PO TABS
10.0000 mg | ORAL_TABLET | Freq: Every day | ORAL | Status: DC
  Administered 2011-08-23 – 2011-08-26 (×4): 10 mg via ORAL
  Filled 2011-08-23 (×4): qty 1

## 2011-08-23 MED ORDER — SODIUM CHLORIDE 0.9 % IV SOLN: 250.0000 mL | INTRAVENOUS | Status: DC | PRN

## 2011-08-23 MED ORDER — PENTAFLUOROPROP-TETRAFLUOROETH EX AERO: 1.0000 "application " | INHALATION_SPRAY | CUTANEOUS | Status: DC | PRN

## 2011-08-23 MED ORDER — HYDROCODONE-ACETAMINOPHEN 5-325 MG PO TABS
1.0000 | ORAL_TABLET | Freq: Four times a day (QID) | ORAL | Status: DC | PRN
  Administered 2011-08-24 – 2011-08-26 (×3): 1 via ORAL
  Filled 2011-08-23 (×4): qty 1

## 2011-08-23 MED ORDER — PARICALCITOL 5 MCG/ML IV SOLN
INTRAVENOUS | Status: AC
  Filled 2011-08-23: qty 1

## 2011-08-23 MED ORDER — ALTEPLASE 2 MG IJ SOLR
2.0000 mg | Freq: Once | INTRAMUSCULAR | Status: AC | PRN
  Filled 2011-08-23: qty 2

## 2011-08-23 MED ORDER — DARBEPOETIN ALFA-POLYSORBATE 200 MCG/0.4ML IJ SOLN
INTRAMUSCULAR | Status: AC
  Filled 2011-08-23: qty 0.4

## 2011-08-23 MED ORDER — LIDOCAINE-PRILOCAINE 2.5-2.5 % EX CREA
1.0000 "application " | TOPICAL_CREAM | CUTANEOUS | Status: DC | PRN
  Filled 2011-08-23: qty 5

## 2011-08-23 MED ORDER — ASPIRIN 81 MG PO TABS: 81.0000 mg | ORAL_TABLET | Freq: Every day | ORAL | Status: DC

## 2011-08-23 MED ORDER — NEPRO/CARBSTEADY PO LIQD
237.0000 mL | ORAL | Status: DC | PRN
  Filled 2011-08-23: qty 237

## 2011-08-23 MED ORDER — ONDANSETRON HCL 4 MG/2ML IJ SOLN
4.0000 mg | Freq: Once | INTRAMUSCULAR | Status: DC
  Filled 2011-08-23: qty 2

## 2011-08-23 MED ORDER — GLUCOSE 40 % PO GEL
1.0000 | Freq: Once | ORAL | Status: AC
  Administered 2011-08-23: 37.5 g via ORAL

## 2011-08-23 MED ORDER — LABETALOL HCL 300 MG PO TABS
300.0000 mg | ORAL_TABLET | Freq: Two times a day (BID) | ORAL | Status: DC
  Administered 2011-08-23 – 2011-08-26 (×7): 300 mg via ORAL
  Filled 2011-08-23 (×8): qty 1

## 2011-08-23 MED ORDER — ONDANSETRON HCL 4 MG/2ML IJ SOLN
4.0000 mg | Freq: Four times a day (QID) | INTRAMUSCULAR | Status: DC | PRN
  Administered 2011-08-23 – 2011-08-24 (×3): 4 mg via INTRAVENOUS
  Filled 2011-08-23 (×2): qty 2

## 2011-08-23 MED ORDER — LIDOCAINE HCL (PF) 1 % IJ SOLN: 5.0000 mL | INTRAMUSCULAR | Status: DC | PRN

## 2011-08-23 MED ORDER — ALBUTEROL SULFATE HFA 108 (90 BASE) MCG/ACT IN AERS
1.0000 | INHALATION_SPRAY | Freq: Four times a day (QID) | RESPIRATORY_TRACT | Status: DC | PRN
  Filled 2011-08-23: qty 6.7

## 2011-08-23 MED ORDER — BISACODYL 10 MG RE SUPP
10.0000 mg | Freq: Every day | RECTAL | Status: DC
  Administered 2011-08-23 – 2011-08-25 (×3): 10 mg via RECTAL
  Filled 2011-08-23 (×4): qty 1

## 2011-08-23 MED ORDER — GLUCOSE 40 % PO GEL
ORAL | Status: AC
  Filled 2011-08-23: qty 1

## 2011-08-23 MED ORDER — CALCIUM ACETATE 667 MG PO CAPS
1334.0000 mg | ORAL_CAPSULE | Freq: Three times a day (TID) | ORAL | Status: DC
  Administered 2011-08-23 – 2011-08-26 (×9): 1334 mg via ORAL
  Filled 2011-08-23 (×12): qty 2

## 2011-08-23 MED ORDER — ROPINIROLE HCL 1 MG PO TABS
1.0000 mg | ORAL_TABLET | Freq: Every day | ORAL | Status: DC
  Administered 2011-08-23 – 2011-08-25 (×3): 1 mg via ORAL
  Filled 2011-08-23 (×5): qty 1

## 2011-08-23 MED ORDER — DEXTROSE 50 % IV SOLN
INTRAVENOUS | Status: AC
  Administered 2011-08-23: 50 mL
  Filled 2011-08-23: qty 50

## 2011-08-23 MED ORDER — HEPARIN SODIUM (PORCINE) 1000 UNIT/ML DIALYSIS
40.0000 [IU]/kg | INTRAMUSCULAR | Status: DC | PRN
  Filled 2011-08-23: qty 5

## 2011-08-23 NOTE — ED Notes (Signed)
Pt states "I don't feel right, I'm trying to check my sugar." Pt reports his CBG by home machine was 59, paged Dr. Quay Burow, Dr. Quay Burow gave this RN a verbal report to give pt OJ, crackers, and PB. Pt refused OJ. Pt given Coke in place of OJ. Pt given OJ after finishing his Coke per pt request for OJ. Pt's CBG then checked by EMT. Pt's CBG 41, EDP [place] informed and new VO for oral Glucose given and administered.

## 2011-08-23 NOTE — ED Provider Notes (Signed)
History     CSN: [id number]  Arrival date & time 08/22/11  2213   First MD Initiated Contact with Patient 08/23/11 0029      Chief Complaint  Patient presents with  . Shortness of Breath    (Consider location/radiation/quality/duration/timing/severity/associated sxs/prior treatment) HPI History provided by patient. Dialysis patient states he has not been able to have a good bowel movement in the last month. He has diffuse abdominal discomfort and states he feels very uncomfortable with distention and now some difficulty breathing. He is a dialysis patient and scheduled dialysis today. He presented here yesterday with similar complaints and had a CAT scan and an NG tube was placed for possible bowel obstruction. He is discharged home with lactulose and states medications have not improved his symptoms. He has not had a bowel movement. No fevers or chills. No chest pain. He states he takes pain medications intermittently. Pain increasing and condition worsening presents here tonight requesting help.  Past Medical History  Diagnosis Date  . Renal disease   . Diabetes mellitus     Past Surgical History  Procedure Date  . Appendectomy   . Hernia repair   . Kidney transplant   . Cataract extraction   . Arteriovenous graft placement   . Av fistula placement   . Toe amputation   . Below knee leg amputation   . Carpal tunnel release   . Shoulder surgery   . Av fistula placement 07/29/2011    Procedure: ARTERIOVENOUS (AV) FISTULA CREATION;  Surgeon: Mal Misty, MD;  Location: [facility];  Service: Vascular;  Laterality: Right;  Brachial cephalic  . Insertion of dialysis catheter 08/01/2011    Procedure: INSERTION OF DIALYSIS CATHETER;  Surgeon: Rosetta Posner, MD;  Location: [facility];  Service: Vascular;  Laterality: Right;  insertion of dialysis catheter on right internal jugular vein  . Lymph node biopsy 08/10/2011    Procedure: LYMPH NODE BIOPSY;  Surgeon: Merrie Roof, MD;  Location: [facility];   Service: General;  Laterality: Left;  left groin lymph biopsy    No family history on file.  History  Substance Use Topics  . Smoking status: Never Smoker   . Smokeless tobacco: Not on file  . Alcohol Use: Yes      Review of Systems  Constitutional: Negative for fever and chills.  HENT: Negative for neck pain and neck stiffness.   Eyes: Negative for pain.  Respiratory: Positive for shortness of breath.   Cardiovascular: Negative for chest pain.  Gastrointestinal: Positive for abdominal pain and abdominal distention.  Genitourinary: Negative for dysuria.  Musculoskeletal: Negative for back pain.  Skin: Negative for rash.  Neurological: Negative for headaches.  All other systems reviewed and are negative.    Allergies  Amoxicillin; Ciprofloxacin; Clindamycin/lincomycin; Codeine; Levofloxacin; Morphine and related; and Vasotec  Home Medications   Current Outpatient Rx  Name Route Sig Dispense Refill  . ASPIRIN 81 MG PO TABS Oral Take 81 mg by mouth daily.    [name] Kitchen BISACODYL 10 MG RE SUPP Rectal Place 10 mg rectally daily as needed. For constipation    . CALCIUM ACETATE 667 MG PO CAPS Oral Take 2 capsules (1,334 mg total) by mouth 3 (three) times daily with meals. 90 capsule 0  . CYCLOSPORINE MODIFIED 100 MG PO CAPS Oral Take 100 mg by mouth 2 (two) times daily. Neoral brand    . PROCRIT IJ Injection Inject 1 each as directed once a week. Takes on Tuesday in dialysis-please  refer to HD records for dose    . HYDROCODONE-ACETAMINOPHEN 5-300 MG PO TABS Oral Take 1 tablet by mouth Every 6 hours as needed. For pain    . INSULIN PUMP Subcutaneous Inject 1 each into the skin continuous. Basal rate is 1.2-1.9 units. Varies with time of day/per patient's device.    . ISOSORBIDE MONONITRATE ER 60 MG PO TB24 Oral Take 60 mg by mouth Daily.    [name] Kitchen LABETALOL HCL 300 MG PO TABS Oral Take 300 mg by mouth Twice daily.    [name] Kitchen LACTULOSE 20 G PO PACK Oral Take 20 g by mouth 3 (three) times daily.      [name] Kitchen LEVOTHYROXINE SODIUM 75 MCG PO TABS Oral Take 75 mg by mouth Daily.    [name] Kitchen METOCLOPRAMIDE HCL 5 MG PO TABS Oral Take 5 mg by mouth 4 (four) times daily - after meals and at bedtime.    [name] Kitchen PARICALCITOL 5 MCG/ML IV SOLN Intravenous Inject 0.4 mLs (2 mcg total) into the vein Every Tuesday,Thursday,and Saturday with dialysis. 1 mL 0  . PREDNISONE 5 MG PO TABS Oral Take 5 mg by mouth Daily.    [name] Kitchen PROMETHAZINE HCL 25 MG PO TABS Oral Take 25 mg by mouth Every 6 hours as needed. For nausea    . PROVENTIL HFA 108 (90 BASE) MCG/ACT IN AERS Inhalation Inhale 1 puff into the lungs Every 6 hours as needed. For shortness of breath    . RANITIDINE HCL 75 MG PO TABS Oral Take 75 mg by mouth daily.    [name] Kitchen ROPINIROLE HCL 1 MG PO TABS Oral Take 1 mg by mouth Every evening. Take at 2000      BP 133/63  Pulse 83  Temp(Src) 98.4 F (36.9 C) (Oral)  Resp 18  SpO2 100%  Physical Exam  Constitutional: He is oriented to person, place, and time. He appears well-developed and well-nourished.  HENT:  Head: Normocephalic and atraumatic.  Eyes: Conjunctivae and EOM are normal. Pupils are equal, round, and reactive to light.  Neck: Trachea normal. Neck supple. No thyromegaly present.  Cardiovascular: Normal rate, regular rhythm, S1 normal, S2 normal and normal pulses.     No systolic murmur is present   No diastolic murmur is present  Pulses:      Radial pulses are 2+ on the right side, and 2+ on the left side.  Pulmonary/Chest: Effort normal and breath sounds normal. He has no wheezes. He has no rhonchi. He has no rales. He exhibits no tenderness.  Abdominal: Normal appearance. There is no CVA tenderness and negative Murphy's sign.       Obese with moderate distention mild diffuse tenderness with decreased bowel sounds  Musculoskeletal:       BLE:s Calves nontender, no cords or erythema, negative Homans sign  Neurological: He is alert and oriented to person, place, and time. He has normal strength. No cranial nerve  deficit or sensory deficit. GCS eye subscore is 4. GCS verbal subscore is 5. GCS motor subscore is 6.  Skin: Skin is warm and dry. No rash noted. He is not diaphoretic.  Psychiatric: His speech is normal.       Cooperative and appropriate    ED Course  Procedures (including critical care time)  Labs Reviewed  CBC - Abnormal; Notable for the following:    RBC 3.69 (*)    Hemoglobin 10.1 (*)    HCT 33.6 (*)    RDW 19.4 (*)    All other components within normal  limits  POCT I-STAT, CHEM 8 - Abnormal; Notable for the following:    Sodium 134 (*)    Creatinine, Ser 2.10 (*)    Glucose, Bld 124 (*)    Hemoglobin 10.5 (*)    HCT 31.0 (*)    All other components within normal limits  POCT I-STAT TROPONIN I  POCT I-STAT TROPONIN I   Ct Abdomen Pelvis Wo Contrast  08/22/2011  *RADIOLOGY REPORT*  Clinical Data: Abdominal distention, constipation  CT ABDOMEN AND PELVIS WITHOUT CONTRAST  Technique:  Multidetector CT imaging of the abdomen and pelvis was performed following the standard protocol without intravenous contrast.  Comparison: 08/06/2011  Findings: There is small pericardial effusion measures 7 mm maximum thickness.  Atherosclerotic calcifications of the coronary arteries. Small left pleural effusion with left base posterior atelectasis.  NG tube with tip coiled within stomach.  Sagittal images of the spine shows degenerative changes lower thoracic spine.  The study is limited without IV contrast.  The unenhanced liver is unremarkable. Unenhanced pancreas, spleen and adrenals are unremarkable. Again noted renal atrophy and chronic renal cortical thinning. Vascular calcifications bilateral kidney are stable.  Again noted multiple enlarged retroperitoneal and mesenteric lymph nodes.  A left periaortic lymph node in axial image 37 measures 2 x 1.6 cm.  Moderate stool noted in the right colon.  Small amount of layering sludge  or  tiny gallstones are suspected within dependent gallbladder.   Atherosclerotic calcifications are again noted SMA, splenic artery bilateral renal artery origin.  Again noted a transplanted kidney in the left iliac fossa stable in appearance without evidence of hydronephrosis or hydroureter.  Mild distended urinary bladder.  Oral contrast material was given to the patient.  No small bowel or colonic obstruction.  Enlarged lymph nodes bilateral inguinal region are again noted.  The largest right inguinal lymph node measures 1.7 x 1.9 cm.  The largest left inguinal lymph node measures 1.7 x 1.5 cm.  There is a fluid collection in the left inguinal region measures 12 HU in attenuation.  This measures 4 x 3.3 cm.  May represent a resolving seroma or hematoma.  There is thickening of the skin anterior abdominal wall and mild subcutaneous anasarca infiltration of the subcutaneous fat.  A small midline lower ventral hernia containing fat measures 3.5 cm only partially visualized.  Postsurgical changes are noted right inguinal region.  No destructive bony lesions are noted within pelvis.  IMPRESSION:  1.  There is small pericardial effusion.  Small left pleural effusion with left base posteriorly atelectasis. 2.  Again noted atrophic native kidneys.  Stable transplanted kidney in the left iliac fossa.  No hydronephrosis or hydroureter. 3.  Again noted retroperitoneal and mesenteric enlarged lymph nodes.  Bilateral enlarged inguinal lymph nodes again noted. 4.  There is a small fluid collection in the left inguinal region measures 4 x 3.3 cm.  This may represent a seroma or resolving hematoma.  Less likely inguinal abscess. 5.  Moderate stool noted in the right colon.  No small bowel or colonic obstruction.  Original Report Authenticated By: Lahoma Crocker, M.D.   Dg Chest Portable 1 View  08/23/2011  *RADIOLOGY REPORT*  Clinical Data: Shortness of breath  PORTABLE CHEST - 1 VIEW  Comparison: 08/03/2011  Findings: Cardiomegaly.  Central vascular congestion. Mild opacity at the left lung  base.  Otherwise, no focal consolidation.  No overt edema.  No pleural effusion or pneumothorax.  Large bore right subclavian catheter tip projects over the cavoatrial junction.  No acute osseous abnormality identified.  IMPRESSION: Cardiomegaly.  Vascular congestion without overt edema.  Mild left lung base opacity; atelectasis versus infiltrate.  Original Report Authenticated By: Suanne Marker, M.D.   Dg Abd Acute W/chest  08/22/2011  *RADIOLOGY REPORT*  Clinical Data: Abdominal pain and constipation.  ACUTE ABDOMEN SERIES (ABDOMEN 2 VIEW & CHEST 1 VIEW)  Comparison: Chest x-ray 08/03/2011.  Abdominal x-ray 04/06/2008.  Findings: There is a right internal jugular PermCath with the tips in the right atrium.  Lung volumes are normal.  Minimal dependent bibasilar opacities are favored to reflect areas of subsegmental atelectasis.  No definite pleural effusions.  Mild pulmonary venous congestion without frank pulmonary edema.  Heart size is mildly enlarged (unchanged).  Mediastinal contours are unremarkable. Atherosclerotic calcifications within the arch of the aorta.  No pneumoperitoneum  Supine and upright views of the abdomen demonstrate gas and stool scattered throughout the colon extending to the level of the distal rectum.  There are some borderline and mildly dilated loops of gas- filled small bowel (up to 4.2 cm in diameter) throughout the central and upper abdomen.  No definite air-fluid levels are noted on the upright projection.  IMPRESSION: 1.  Nonspecific bowel gas pattern, as above.  Findings may simply reflect an ileus, however, clinical correlation for signs and symptoms of early or partial small bowel obstruction is recommended. 2.  No pneumoperitoneum. 3.  No radiographic evidence of acute cardiopulmonary disease. 4.  Mild cardiomegaly is unchanged. 5.  Support apparatus, as above.  Original Report Authenticated By: Etheleen Mayhew, M.D.   Pain medications provided. Old records  reviewed.   Case discussed as above with [facility] hospitalist on-call Dr. Quay Burow, will admit patient. Plan aggressive bowel regimen. Continue pain medications as needed.   MDM   Constipation with abdominal discomfort and now shortness of breath. Was admitted to ED observation unit yesterday for the same and not improving as an outpatient. Medical admission. Patient given lactulose, fleets, and aggressive bowel regimen. Will need inpatient dialysis        Teressa Lower, MD 08/23/11 [id number]

## 2011-08-23 NOTE — Progress Notes (Signed)
Utilization review complete 

## 2011-08-23 NOTE — Progress Notes (Signed)
Patient admitted with constipation.  Has history of Type 1 Diabetes.  Was diagnosed at age 45 and is completely insulin deficient.  Normally wears insulin pump to control his CBGs.  Sees Dr. Alton Revere (endocrinologist) in [place].  Upon interview, patient told me he would prefer to not use his insulin pump while in the hospital.  Patient told me he would rather have the physicians and nurses regulate his CBGs with Lantus and Novolog.  Patient attributes his hypoglycemia in the ED due to the fact that he did not eat anything while in the ED.  Review of patient's insulin pump:  Basal= 37.5 units total basal insulin per 24 hour period Carbohydrate coverage= 1 unit for every 7 grams carbohydrates consumed Correction factor= 1 unit for every 20 mg/dl above his goal CBG  Spoke with Dr. Tana Coast via telephone.  Recommend we start 2/3 of his basal insulin rate as Lantus.  Lantus 25 units now and then daily.  Recommend a lower dose of basal insulin at this point due to the fact that he had hypoglycemia in the ED.  Patient will likely require more basal insulin.  Will monitor and assess his needs daily.  Recommend we also continue his Sensitive SSI.  Patient will likely need meal coverage once he starts eating a solid diet.  Recommend we start with 3-4 units Novolog tid with meals and titrate as needed.  Will follow.  Spoke to [place], RN on 4700 about new Lantus orders placed by Dr. Tana Coast. Wyn Quaker RN, MSN, CDE Diabetes Coordinator Inpatient Diabetes Program [phone]

## 2011-08-23 NOTE — Progress Notes (Signed)
Hemodialysis-See flowsheet. BP dropping during HD, pt frequently using bedpan. Has had 5-10 small (<10cc) runny yellow stools with frequent urgency. Discussed fluid goal with Dr. Posey Pronto earlier. Unable to reach 2 Liter goal. Total UF 952ml. VS stable post HD.

## 2011-08-23 NOTE — ED Notes (Signed)
Pt is resting in wheelchair; respirations even and unlabored; no signs of distress; family with him.

## 2011-08-23 NOTE — Progress Notes (Signed)
Nursing Note: Diabetes Coordinator made aware of consult per Nurse and diabetes coordinator  stated "I will come evaluate pt". Will continue to monitor pt. 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

## 2011-08-23 NOTE — H&P (Signed)
History and Physical       Hospital Admission Note Date: 08/23/2011  Patient name: Dale [name] Medical record number: [id number] Date of birth: [birth year] Age: 46 y.o. Gender: male PCP: Windy Kalata, MD, MD   Chief Complaint:  Constipation with abdominal pain and distention per patient over a week   HPI: Patient is a 46 year old malewho was recently discharged from the hospital on 08/13/2011 after a prolonged course of hospitalization, has a history of end-stage renal disease on hemodialysis, history of renal transplant, diabetes mellitus with insulin pump, obstructive sleep apnea presents to [facility] ED with constipation. History was obtained from the patient who stated that he has not been able to have good bowel movements in the last month. He has mildly diffuse abdominal discomfort with distention, some difficulty breathing due to distention. Apparently patient had presented to ED with similar complaints yesterday, had CT scan and NG tube was placed for possible bowel obstruction. He was then discharged home with lactulose however patient stated that nothing helped and he still did not have a bowel movement. He is due for his hemodialysis today. He feels somewhat nauseated however no frank vomiting or hematemesis.   Review of Systems:  Constitutional: Denies fever, chills, diaphoresis, appetite change and fatigue.  HEENT: Denies photophobia, eye pain, redness, hearing loss, ear pain, congestion, sore throat, rhinorrhea, sneezing, mouth sores, trouble swallowing, neck pain, neck stiffness and tinnitus.   Respiratory: Denies  cough, chest tightness, and wheezing.   Cardiovascular: Denies chest pain, palpitations and leg swelling.  Gastrointestinal: Please see history of present illness  Genitourinary: Denies dysuria, urgency, frequency, hematuria, flank pain and difficulty urinating.  Musculoskeletal: Denies myalgias, back pain,  joint swelling, arthralgias and gait problem.  Skin: Denies pallor, rash and wound.  Neurological: Denies dizziness, seizures, syncope, weakness, light-headedness, numbness and headaches.  Hematological: Denies adenopathy. Easy bruising, personal or family bleeding history  Psychiatric/Behavioral: Denies suicidal ideation, mood changes, confusion, nervousness, sleep disturbance and agitation  Past Medical History: Past Medical History  Diagnosis Date  . Renal disease   . Diabetes mellitus    Past Surgical History  Procedure Date  . Appendectomy   . Hernia repair   . Kidney transplant   . Cataract extraction   . Arteriovenous graft placement   . Av fistula placement   . Toe amputation   . Below knee leg amputation   . Carpal tunnel release   . Shoulder surgery   . Av fistula placement 07/29/2011    Procedure: ARTERIOVENOUS (AV) FISTULA CREATION;  Surgeon: Mal Misty, MD;  Location: [facility];  Service: Vascular;  Laterality: Right;  Brachial cephalic  . Insertion of dialysis catheter 08/01/2011    Procedure: INSERTION OF DIALYSIS CATHETER;  Surgeon: Rosetta Posner, MD;  Location: [place];  Service: Vascular;  Laterality: Right;  insertion of dialysis catheter on right internal jugular vein  . Lymph node biopsy 08/10/2011    Procedure: LYMPH NODE BIOPSY;  Surgeon: Merrie Roof, MD;  Location: [place];  Service: General;  Laterality: Left;  left groin lymph biopsy    Medications: Prior to Admission medications   Medication Sig Start Date End Date Taking? Authorizing Provider  aspirin 81 MG tablet Take 81 mg by mouth daily.   Yes Historical Provider, MD  bisacodyl (DULCOLAX) 10 MG suppository Place 10 mg rectally daily as needed. For constipation 08/22/11 09/01/11 Yes Catherine E Schinlever, PA  calcium acetate (PHOSLO) 667 MG capsule Take 2 capsules (1,334 mg total) by  mouth 3 (three) times daily with meals. 08/13/11 08/12/12 Yes Nita Sells, MD  cycloSPORINE modified (NEORAL/GENGRAF)  100 MG capsule Take 100 mg by mouth 2 (two) times daily. Neoral brand 06/26/11  Yes Historical Provider, MD  Epoetin Alfa (PROCRIT IJ) Inject 1 each as directed once a week. Takes on Tuesday in dialysis-please refer to HD records for dose   Yes Historical Provider, MD  Hydrocodone-Acetaminophen 5-300 MG TABS Take 1 tablet by mouth Every 6 hours as needed. For pain 07/14/11  Yes Historical Provider, MD  Insulin Human (INSULIN PUMP) 100 unit/ml SOLN Inject 1 each into the skin continuous. Basal rate is 1.2-1.9 units. Varies with time of day/per patient's device.   Yes Historical Provider, MD  isosorbide mononitrate (IMDUR) 60 MG 24 hr tablet Take 60 mg by mouth Daily. 07/01/11  Yes Historical Provider, MD  labetalol (NORMODYNE) 300 MG tablet Take 300 mg by mouth Twice daily. 05/26/11  Yes Historical Provider, MD  lactulose (CEPHULAC) 20 G packet Take 20 g by mouth 3 (three) times daily. 08/22/11 09/01/11 Yes Catherine E Schinlever, PA  levothyroxine (SYNTHROID, LEVOTHROID) 75 MCG tablet Take 75 mg by mouth Daily. 06/26/11  Yes Historical Provider, MD  metoCLOPramide (REGLAN) 5 MG tablet Take 5 mg by mouth 4 (four) times daily - after meals and at bedtime. 05/26/11  Yes Historical Provider, MD  paricalcitol (ZEMPLAR) 5 MCG/ML injection Inject 0.4 mLs (2 mcg total) into the vein Every Tuesday,Thursday,and Saturday with dialysis. 08/13/11  Yes Nita Sells, MD  predniSONE (DELTASONE) 5 MG tablet Take 5 mg by mouth Daily. 06/26/11  Yes Historical Provider, MD  promethazine (PHENERGAN) 25 MG tablet Take 25 mg by mouth Every 6 hours as needed. For nausea 07/13/11  Yes Historical Provider, MD  PROVENTIL HFA 108 (90 BASE) MCG/ACT inhaler Inhale 1 puff into the lungs Every 6 hours as needed. For shortness of breath 05/17/11  Yes Historical Provider, MD  ranitidine (ZANTAC) 75 MG tablet Take 75 mg by mouth daily.   Yes Historical Provider, MD  rOPINIRole (REQUIP) 1 MG tablet Take 1 mg by mouth Every evening. Take at 2000  07/18/11  Yes Historical Provider, MD    Allergies:   Allergies  Allergen Reactions  . Amoxicillin Other (See Comments)    Reaction unknown  . Ciprofloxacin Other (See Comments)    Reaction unknown  . Clindamycin/Lincomycin Other (See Comments)    Reaction unknown  . Codeine Other (See Comments)    Reaction unknown  . Levofloxacin Other (See Comments)    Reaction unknown  . Morphine And Related     nausea  . Vasotec Cough    Social History:  reports that he has never smoked. He does not have any smokeless tobacco history on file. He reports that he drinks alcohol. He reports that he does not use illicit drugs.  Family History: History reviewed. No pertinent family history.  Physical Exam: Blood pressure 109/64, pulse 80, temperature 98.3 F (36.8 C), temperature source Oral, resp. rate 20, height 5\' 8"  (1.727 m), weight 117.4 kg (258 lb 13.1 oz), SpO2 100.00%. General: Alert, awake, oriented x3, appears somewhat uncomfortable but not acute distress. HEENT: anicteric sclera, pink conjunctiva, pupils equal and reactive to light and accomodation Neck: supple, no masses or lymphadenopathy, no goiter, no bruits  Heart: Regular rate and rhythm, without murmurs, rubs or gallops. Lungs: Clear to auscultation bilaterally, no wheezing, rales or rhonchi. Abdomen: Soft, but distended, minimal tenderness, obese, hypoactive bowel sounds Extremities: No clubbing, cyanosis or edema  with positive pedal pulses, left BKA. Neuro: Grossly intact, no focal neurological deficits, strength 5/5 upper and lower extremities bilaterally Skin: no rashes or lesions, warm and dry   LABS on Admission:  Basic Metabolic Panel:  Lab [id number] 08/22/11 0746  NA 134* 134*  K 4.4 4.0  CL 99 98  CO2 -- 30  GLUCOSE 124* 98  BUN 21 19  CREATININE 2.10* 2.15*  CALCIUM -- 9.4  MG -- --  PHOS -- --     Lab 08/23/11 0228 08/23/11 0151 08/22/11 0746  WBC -- 8.2 8.1  NEUTROABS -- -- 4.6  HGB 10.5*  10.1* --  HCT 31.0* 33.6* --  MCV -- 91.1 --  PLT -- 200 238   Cardiac Enzymes: No results found for this basename: CKTOTAL:2,CKMB:2,CKMBINDEX:2,TROPONINI:2 in the last 168 hours BNP: No components found with this basename: POCBNP:2 CBG:  Lab 08/23/11 0824 08/23/11 0756  GLUCAP 95 81     Radiological Exams on Admission: Ct Abdomen Pelvis Wo Contrast  08/22/2011  *RADIOLOGY REPORT*  Clinical Data: Abdominal distention, constipation  CT ABDOMEN AND PELVIS WITHOUT CONTRAST  Technique:  Multidetector CT imaging of the abdomen and pelvis was performed following the standard protocol without intravenous contrast.  Comparison: 08/06/2011  Findings: There is small pericardial effusion measures 7 mm maximum thickness.  Atherosclerotic calcifications of the coronary arteries. Small left pleural effusion with left base posterior atelectasis.  NG tube with tip coiled within stomach.  Sagittal images of the spine shows degenerative changes lower thoracic spine.  The study is limited without IV contrast.  The unenhanced liver is unremarkable. Unenhanced pancreas, spleen and adrenals are unremarkable. Again noted renal atrophy and chronic renal cortical thinning. Vascular calcifications bilateral kidney are stable.  Again noted multiple enlarged retroperitoneal and mesenteric lymph nodes.  A left periaortic lymph node in axial image 37 measures 2 x 1.6 cm.  Moderate stool noted in the right colon.  Small amount of layering sludge  or  tiny gallstones are suspected within dependent gallbladder.  Atherosclerotic calcifications are again noted SMA, splenic artery bilateral renal artery origin.  Again noted a transplanted kidney in the left iliac fossa stable in appearance without evidence of hydronephrosis or hydroureter.  Mild distended urinary bladder.  Oral contrast material was given to the patient.  No small bowel or colonic obstruction.  Enlarged lymph nodes bilateral inguinal region are again noted.  The  largest right inguinal lymph node measures 1.7 x 1.9 cm.  The largest left inguinal lymph node measures 1.7 x 1.5 cm.  There is a fluid collection in the left inguinal region measures 12 HU in attenuation.  This measures 4 x 3.3 cm.  May represent a resolving seroma or hematoma.  There is thickening of the skin anterior abdominal wall and mild subcutaneous anasarca infiltration of the subcutaneous fat.  A small midline lower ventral hernia containing fat measures 3.5 cm only partially visualized.  Postsurgical changes are noted right inguinal region.  No destructive bony lesions are noted within pelvis.  IMPRESSION:  1.  There is small pericardial effusion.  Small left pleural effusion with left base posteriorly atelectasis. 2.  Again noted atrophic native kidneys.  Stable transplanted kidney in the left iliac fossa.  No hydronephrosis or hydroureter. 3.  Again noted retroperitoneal and mesenteric enlarged lymph nodes.  Bilateral enlarged inguinal lymph nodes again noted. 4.  There is a small fluid collection in the left inguinal region measures 4 x 3.3 cm.  This may represent a  seroma or resolving hematoma.  Less likely inguinal abscess. 5.  Moderate stool noted in the right colon.  No small bowel or colonic obstruction.  Original Report Authenticated By: Lahoma Crocker, M.D.   Ct Abdomen Pelvis Wo Contrast  08/04/2011  *RADIOLOGY REPORT*  Clinical Data: Recurrent renal failure requiring hemodialysis. History of renal transplant.  CT ABDOMEN AND PELVIS WITHOUT CONTRAST  Technique:  Multidetector CT imaging of the abdomen and pelvis was performed following the standard protocol without intravenous contrast.  Comparison: Right upper quadrant abdominal ultrasound 07/14/2011. Abdominal CT 08/26/2004.  Findings: Images through the lung bases demonstrate cardiomegaly and hemodialysis catheters within the right atrium.  There is vascular congestion and mild bibasilar atelectasis.  No focal abnormalities are seen within the  liver, spleen, gallbladder, pancreas or adrenal glands.  The spleen is slightly larger than on the prior CT, but without focal abnormality.  There is severe chronic cortical thinning of the native kidneys bilaterally.  Appearance is unchanged.  The transplanted kidney in the left iliac fossa appears stable without hydronephrosis, significant cortical thinning or surrounding inflammatory change.  The patient has developed multiple enlarged retroperitoneal and mesenteric lymph nodes.  Examples include an 11 mm right retrocrural node on image 16, a mesenteric node measuring 3.6 x 2.3 cm on image 31, a left periaortic node measuring 2.3 x 3.1 cm on image 46 and a 2.0 x 3.8 cm left external iliac node on image 79. In addition, there are several mildly enlarged lymph nodes within the left groin.  There are postsurgical changes in the right groin without significant adenopathy.  No bowel lesions are identified.  There is no evidence of bowel obstruction.  The bladder contents demonstrate mildly increased density.  There is no evidence of diskitis or other acute osseous abnormality.  IMPRESSION:  1.  Interval development of multi focal lymphadenopathy suspicious for lymphoma.  Enlarged lymph nodes in the left groin are most accessible for excisional biopsy. 2.  Slight interval increase in splenic size without significant splenomegaly or focal abnormality. 3.  Transplanted kidney in the left iliac fossa has a stable appearance without hydronephrosis or perinephric soft tissue stranding.  Chronic atrophy of the native kidneys appears unchanged. 4.  High density urine within the urinary bladder.  Original Report Authenticated By: Vivia Ewing, M.D.   Dg Chest 2 View  08/02/2011  *RADIOLOGY REPORT*  Clinical Data: History of difficulty breathing.  History of fever.  CHEST - 2 VIEW  Comparison: 07/22/2011.  08/01/2011.  Findings: Right sided dialysis catheter has tips in right atrial region.  There is stable cardiac  silhouette enlargement.  There is upper lobe vascular prominence with pulmonary venous hypertension vascular congestion pattern.  No consolidation or definite pleural effusion is seen.  There is slight elevation of the margin of the left hemidiaphragm with minimal basilar atelectasis. On the lateral image there appears to be some posterior atelectasis and patchy infiltrative density without consolidation.  There is minimal degenerative spondylosis.  IMPRESSION: Dialysis catheter in place.  Stable cardiac silhouette enlargement. Pulmonary venous hypertension vascular congestion pattern.  No consolidation or pleural effusion. Left basilar and posterior atelectasis and patchy infiltrative densities without consolidation.  Original Report Authenticated By: Delane Ginger, M.D.   US Biopsy  08/05/2011  *RADIOLOGY REPORT*  Clinical history:46 year old with history of renal transplant and fevers.  The patient has lymphadenopathy in the retroperitoneum and left inguinal region.  PROCEDURE(S): ULTRASOUND GUIDED BIOPSY OF LEFT INGUINAL LYMPH NODE.  Physician: Stephan Minister. Anselm Pancoast, MD  Medications:None  Moderate sedation time:None  Fluoroscopy time: None  Procedure:The procedure was explained to the patient.  The risks and benefits of the procedure were discussed and the patient's questions were addressed.  Informed consent was obtained from the patient.  The left inguinal area was evaluated with ultrasound. Enlarged lymph nodes were identified.  Prominent lymph node anterior to the saphenofemoral junction of the left common femoral vein was targeted.  The left groin was prepped and draped in a sterile fashion.  The skin was anesthetized with 1% lidocaine.  18 gauge core needle was directed into the lymph node with ultrasound guidance.  Five core biopsies were obtained.  Samples placed in saline.  Specimens sent for pathology and microbiology.  Findings:Multiple enlarged left inguinal lymph nodes.  Prominent lymph node anterior to the  saphenofemoral junction was biopsied. No significant bleeding following the core biopsies.  Complications: None  Impression:Ultrasound guided core biopsies of a large left inguinal lymph node.  Samples sent for pathology and microbiology.  Original Report Authenticated By: Markus Daft, M.D.   Dg Chest Portable 1 View  08/23/2011  *RADIOLOGY REPORT*  Clinical Data: Shortness of breath  PORTABLE CHEST - 1 VIEW  Comparison: 08/03/2011  Findings: Cardiomegaly.  Central vascular congestion. Mild opacity at the left lung base.  Otherwise, no focal consolidation.  No overt edema.  No pleural effusion or pneumothorax.  Large bore right subclavian catheter tip projects over the cavoatrial junction.  No acute osseous abnormality identified.  IMPRESSION: Cardiomegaly.  Vascular congestion without overt edema.  Mild left lung base opacity; atelectasis versus infiltrate.  Original Report Authenticated By: Suanne Marker, M.D.   Dg Chest Port 1 View  08/03/2011  *RADIOLOGY REPORT*  Clinical Data: Cough and shortness of breath.  PORTABLE CHEST - 1 VIEW  Comparison: 08/12/2011.  Findings: The diatek catheter is stable.  The heart is enlarged but stable.  The lungs are better aerated with resolving edema.  No effusions.  IMPRESSION: Slight improved lung aeration.  Vascular congestion without pulmonary edema.  Original Report Authenticated By: P. Kalman Jewels, M.D.   Dg Chest Port 1 View  08/01/2011  *RADIOLOGY REPORT*  Clinical Data: Central line placement.  PORTABLE CHEST - 1 VIEW  Comparison: 07/22/2011.  Findings: 1504 hours.  Right IJ dialysis catheter tips are present within the right atrium.  There is stable cardiomegaly with chronic vascular congestion.  No confluent airspace opacity, significant pleural effusion or pneumothorax is seen.  IMPRESSION: Dialysis catheter placement without demonstrated pneumothorax. Stable cardiomegaly and vascular congestion.  Original Report Authenticated By: Vivia Ewing, M.D.    Dg Abd Acute W/chest  08/22/2011  *RADIOLOGY REPORT*  Clinical Data: Abdominal pain and constipation.  ACUTE ABDOMEN SERIES (ABDOMEN 2 VIEW & CHEST 1 VIEW)  Comparison: Chest x-ray 08/03/2011.  Abdominal x-ray 04/06/2008.  Findings: There is a right internal jugular PermCath with the tips in the right atrium.  Lung volumes are normal.  Minimal dependent bibasilar opacities are favored to reflect areas of subsegmental atelectasis.  No definite pleural effusions.  Mild pulmonary venous congestion without frank pulmonary edema.  Heart size is mildly enlarged (unchanged).  Mediastinal contours are unremarkable. Atherosclerotic calcifications within the arch of the aorta.  No pneumoperitoneum  Supine and upright views of the abdomen demonstrate gas and stool scattered throughout the colon extending to the level of the distal rectum.  There are some borderline and mildly dilated loops of gas- filled small bowel (up to 4.2 cm in diameter) throughout the central and upper  abdomen.  No definite air-fluid levels are noted on the upright projection.  IMPRESSION: 1.  Nonspecific bowel gas pattern, as above.  Findings may simply reflect an ileus, however, clinical correlation for signs and symptoms of early or partial small bowel obstruction is recommended. 2.  No pneumoperitoneum. 3.  No radiographic evidence of acute cardiopulmonary disease. 4.  Mild cardiomegaly is unchanged. 5.  Support apparatus, as above.  Original Report Authenticated By: Etheleen Mayhew, M.D.   Dg Fluoro Guide Cv Line-no Report  08/01/2011  CLINICAL DATA: dialysis catheter insertion   FLOURO GUIDE CV LINE  Fluoroscopy was utilized by the requesting physician.  No radiographic  interpretation.      Assessment/Plan Present on Admission:  .Constipation, acute - Patient was started on bowel prep since yesterday in ED. Abdominal x-ray reviewed, no bowel obstruction. I have also placed him on scheduled sorbitol today besides Dulcolax  suppository, Senokot-S, lactulose, will give smog enema x1    .CKD (chronic kidney disease): Status post renal transplant, now on hemodialysis Tues-Thurs-Sat - Renal service consulted, discussed with Dr. Graylon Gunning, scheduled for hemodialysis today - patient had CT-guided biopsy done this month, will appreciate if renal service (patient's primary nephrologist is Dr. Mercy Moore) can followup on the biopsy for further management   .Obstructive sleep apnea:  - Place him on CPAP   .Anemia: Stable   .Diabetes mellitus: HbA1c ordered - Patient has an insulin pump, I have consulted inpatient diabetes coordinator for insulin pump settings and recommendations - For now will place him on a sliding scale insulin   .Dyspnea : Per patient worsening due to abdominal distention and constipation, hopefully will improve with the bowel movements   .CHF (congestive heart failure): Compensated  DVT prophylaxis: SCDs  CODE STATUS: Full code  Further plan will depend as patient's clinical course evolves and further radiologic and laboratory data become available.   @Time  Spent on Admission: 1 hour Nichola Warren M.D. [facility] 08/23/2011, 8:44 AM

## 2011-08-23 NOTE — Progress Notes (Signed)
Nursing Note: Pt arrived to room 4741 with no complaints. Pt stable with no signs of distress on initial assessment. Pt's blood sugar 81. Pt has insulin pump, MD aware. Diabetes coordinator to be made aware per MD's order.Cardiac monitor applied. Will continue to monitor pt. Rosana Fret RN

## 2011-08-23 NOTE — Consult Note (Signed)
Reason for Consult: Continuity of end-stage renal disease care Referring Physician: Estill Cotta MD, [facility]  Dale [name] is an 46 y.o. male.  HPI:  Patient is a 46 year old Caucasian man who was recently discharged from the hospital on 08/13/2011 after a prolonged hospitalization. He has a history of end-stage renal disease on hemodialysis status post failed cadaveric renal transplant after 9 years of allograft function, diabetes mellitus with insulin pump and obstructive sleep apnea. He presented to ED with constipation with associated abdominal distention/discomfort that was not responsive to previously prescribed MiraLax/lactulose and over-the-counter Colace/Peri-Colace.  He was evaluated in the emergency room yesterday and discharged after CT scan of the abdomen revealed no acute needs for admission. So far in the hospital, his received one dose of sorbitol and is awaiting a bowel movement.  Has been on HD for <1 month, dialyses at [facility], 4hr, Rt IJ tunneled catheter (Maturing RUA AVF). QB 400/QD AF 1.5, EDW 115Kg, Epogen 28000units IV TIW, Zemplar 48mcg TIW and heparin bolus 3400 units.     Past Medical History  Diagnosis Date  . Renal disease   . Diabetes mellitus     Past Surgical History  Procedure Date  . Appendectomy   . Hernia repair   . Kidney transplant   . Cataract extraction   . Arteriovenous graft placement   . Av fistula placement   . Toe amputation   . Below knee leg amputation   . Carpal tunnel release   . Shoulder surgery   . Av fistula placement 07/29/2011    Procedure: ARTERIOVENOUS (AV) FISTULA CREATION;  Surgeon: Mal Misty, MD;  Location: [place];  Service: Vascular;  Laterality: Right;  Brachial cephalic  . Insertion of dialysis catheter 08/01/2011    Procedure: INSERTION OF DIALYSIS CATHETER;  Surgeon: Rosetta Posner, MD;  Location: [place];  Service: Vascular;  Laterality: Right;  insertion of dialysis catheter on right internal jugular vein  . Lymph  node biopsy 08/10/2011    Procedure: LYMPH NODE BIOPSY;  Surgeon: Merrie Roof, MD;  Location: [place];  Service: General;  Laterality: Left;  left groin lymph biopsy    History reviewed. No pertinent family history.  Social History:  reports that he has never smoked. He does not have any smokeless tobacco history on file. He reports that he drinks alcohol. He reports that he does not use illicit drugs.  Allergies:  Allergies  Allergen Reactions  . Amoxicillin Other (See Comments)    Reaction unknown  . Ciprofloxacin Other (See Comments)    Reaction unknown  . Clindamycin/Lincomycin Other (See Comments)    Reaction unknown  . Codeine Other (See Comments)    Reaction unknown  . Levofloxacin Other (See Comments)    Reaction unknown  . Morphine And Related     nausea  . Vasotec Cough    Medications:  Scheduled:   . aspirin EC  81 mg Oral Daily  . bisacodyl  10 mg Rectal Daily  . calcium acetate  1,334 mg Oral TID WC  . cycloSPORINE modified  100 mg Oral BID  . dextrose  1 Tube Oral Once  . dextrose      . famotidine  10 mg Oral Daily  .  HYDROmorphone (DILAUDID) injection  1 mg Intravenous Once  . insulin aspart  0-5 Units Subcutaneous QHS  . insulin aspart  0-9 Units Subcutaneous TID WC  . insulin glargine  25 Units Subcutaneous Daily  . isosorbide mononitrate  60 mg  Oral Daily  . labetalol  300 mg Oral BID  . lactulose  20 g Oral TID  . levothyroxine  75 mcg Oral QAC breakfast  . metoCLOPramide  5 mg Oral TID PC & HS  . ondansetron  4 mg Intravenous Once  . paricalcitol  2 mcg Intravenous Q T,Th,Sa-HD  . predniSONE  5 mg Oral Q breakfast  . rOPINIRole  1 mg Oral QHS  . senna-docusate  2 tablet Oral Daily  . sorbitol  30 mL Oral BID  . sorbitol, milk of mag, mineral oil, glycerin (SMOG) enema  960 mL Rectal Once  . DISCONTD: aspirin  81 mg Oral Daily  . DISCONTD:  HYDROmorphone (DILAUDID) injection  1 mg Intravenous Once  . DISCONTD: lactulose  20 g Oral TID  .  DISCONTD: lactulose  30 g Oral Once  . DISCONTD: magnesium citrate  1 Bottle Oral Once  . DISCONTD: ondansetron  4 mg Intravenous Once  . DISCONTD: sodium chloride  3 mL Intravenous Q12H  . DISCONTD: sodium phosphate  1 enema Rectal Once    Results for orders placed during the hospital encounter of 08/22/11 (from the past 48 hour(s))  POCT I-STAT TROPONIN I     Status: Normal   Collection Time   08/22/11 10:37 PM      Component Value Range Comment   Troponin i, poc 0.01  0.00 - 0.08 (ng/mL)    Comment 3            CBC     Status: Abnormal   Collection Time   08/23/11  1:51 AM      Component Value Range Comment   WBC 8.2  4.0 - 10.5 (K/uL)    RBC 3.69 (*) 4.22 - 5.81 (MIL/uL)    Hemoglobin 10.1 (*) 13.0 - 17.0 (g/dL)    HCT 33.6 (*) 39.0 - 52.0 (%)    MCV 91.1  78.0 - 100.0 (fL)    MCH 27.4  26.0 - 34.0 (pg)    MCHC 30.1  30.0 - 36.0 (g/dL)    RDW 19.4 (*) 11.5 - 15.5 (%)    Platelets 200  150 - 400 (K/uL)   POCT I-STAT TROPONIN I     Status: Normal   Collection Time   08/23/11  2:26 AM      Component Value Range Comment   Troponin i, poc 0.01  0.00 - 0.08 (ng/mL)    Comment 3            POCT I-STAT, CHEM 8     Status: Abnormal   Collection Time   08/23/11  2:28 AM      Component Value Range Comment   Sodium 134 (*) 135 - 145 (mEq/L)    Potassium 4.4  3.5 - 5.1 (mEq/L)    Chloride 99  96 - 112 (mEq/L)    BUN 21  6 - 23 (mg/dL)    Creatinine, Ser 2.10 (*) 0.50 - 1.35 (mg/dL)    Glucose, Bld 124 (*) 70 - 99 (mg/dL)    Calcium, Ion 1.14  1.12 - 1.32 (mmol/L)    TCO2 28  0 - 100 (mmol/L)    Hemoglobin 10.5 (*) 13.0 - 17.0 (g/dL)    HCT 31.0 (*) 39.0 - 52.0 (%)   GLUCOSE, CAPILLARY     Status: Abnormal   Collection Time   08/23/11  5:55 AM      Component Value Range Comment   Glucose-Capillary 41 (*) 70 - 99 (mg/dL)  Comment 1 Notify RN     GLUCOSE, CAPILLARY     Status: Abnormal   Collection Time   08/23/11  6:15 AM      Component Value Range Comment    Glucose-Capillary 38 (*) 70 - 99 (mg/dL)   GLUCOSE, CAPILLARY     Status: Normal   Collection Time   08/23/11  6:49 AM      Component Value Range Comment   Glucose-Capillary 95  70 - 99 (mg/dL)    Comment 1 Notify RN     GLUCOSE, CAPILLARY     Status: Normal   Collection Time   08/23/11  7:56 AM      Component Value Range Comment   Glucose-Capillary 81  70 - 99 (mg/dL)   GLUCOSE, CAPILLARY     Status: Normal   Collection Time   08/23/11  8:24 AM      Component Value Range Comment   Glucose-Capillary 95  70 - 99 (mg/dL)   MRSA PCR SCREENING     Status: Normal   Collection Time   08/23/11  9:42 AM      Component Value Range Comment   MRSA by PCR NEGATIVE  NEGATIVE      Ct Abdomen Pelvis Wo Contrast  08/22/2011  *RADIOLOGY REPORT*  Clinical Data: Abdominal distention, constipation  CT ABDOMEN AND PELVIS WITHOUT CONTRAST  Technique:  Multidetector CT imaging of the abdomen and pelvis was performed following the standard protocol without intravenous contrast.  Comparison: 08/06/2011  Findings: There is small pericardial effusion measures 7 mm maximum thickness.  Atherosclerotic calcifications of the coronary arteries. Small left pleural effusion with left base posterior atelectasis.  NG tube with tip coiled within stomach.  Sagittal images of the spine shows degenerative changes lower thoracic spine.  The study is limited without IV contrast.  The unenhanced liver is unremarkable. Unenhanced pancreas, spleen and adrenals are unremarkable. Again noted renal atrophy and chronic renal cortical thinning. Vascular calcifications bilateral kidney are stable.  Again noted multiple enlarged retroperitoneal and mesenteric lymph nodes.  A left periaortic lymph node in axial image 37 measures 2 x 1.6 cm.  Moderate stool noted in the right colon.  Small amount of layering sludge  or  tiny gallstones are suspected within dependent gallbladder.  Atherosclerotic calcifications are again noted SMA, splenic artery  bilateral renal artery origin.  Again noted a transplanted kidney in the left iliac fossa stable in appearance without evidence of hydronephrosis or hydroureter.  Mild distended urinary bladder.  Oral contrast material was given to the patient.  No small bowel or colonic obstruction.  Enlarged lymph nodes bilateral inguinal region are again noted.  The largest right inguinal lymph node measures 1.7 x 1.9 cm.  The largest left inguinal lymph node measures 1.7 x 1.5 cm.  There is a fluid collection in the left inguinal region measures 12 HU in attenuation.  This measures 4 x 3.3 cm.  May represent a resolving seroma or hematoma.  There is thickening of the skin anterior abdominal wall and mild subcutaneous anasarca infiltration of the subcutaneous fat.  A small midline lower ventral hernia containing fat measures 3.5 cm only partially visualized.  Postsurgical changes are noted right inguinal region.  No destructive bony lesions are noted within pelvis.  IMPRESSION:  1.  There is small pericardial effusion.  Small left pleural effusion with left base posteriorly atelectasis. 2.  Again noted atrophic native kidneys.  Stable transplanted kidney in the left iliac fossa.  No hydronephrosis  or hydroureter. 3.  Again noted retroperitoneal and mesenteric enlarged lymph nodes.  Bilateral enlarged inguinal lymph nodes again noted. 4.  There is a small fluid collection in the left inguinal region measures 4 x 3.3 cm.  This may represent a seroma or resolving hematoma.  Less likely inguinal abscess. 5.  Moderate stool noted in the right colon.  No small bowel or colonic obstruction.  Original Report Authenticated By: Lahoma Crocker, M.D.   Dg Chest Portable 1 View  08/23/2011  *RADIOLOGY REPORT*  Clinical Data: Shortness of breath  PORTABLE CHEST - 1 VIEW  Comparison: 08/03/2011  Findings: Cardiomegaly.  Central vascular congestion. Mild opacity at the left lung base.  Otherwise, no focal consolidation.  No overt edema.  No  pleural effusion or pneumothorax.  Large bore right subclavian catheter tip projects over the cavoatrial junction.  No acute osseous abnormality identified.  IMPRESSION: Cardiomegaly.  Vascular congestion without overt edema.  Mild left lung base opacity; atelectasis versus infiltrate.  Original Report Authenticated By: Suanne Marker, M.D.   Dg Abd Acute W/chest  08/22/2011  *RADIOLOGY REPORT*  Clinical Data: Abdominal pain and constipation.  ACUTE ABDOMEN SERIES (ABDOMEN 2 VIEW & CHEST 1 VIEW)  Comparison: Chest x-ray 08/03/2011.  Abdominal x-ray 04/06/2008.  Findings: There is a right internal jugular PermCath with the tips in the right atrium.  Lung volumes are normal.  Minimal dependent bibasilar opacities are favored to reflect areas of subsegmental atelectasis.  No definite pleural effusions.  Mild pulmonary venous congestion without frank pulmonary edema.  Heart size is mildly enlarged (unchanged).  Mediastinal contours are unremarkable. Atherosclerotic calcifications within the arch of the aorta.  No pneumoperitoneum  Supine and upright views of the abdomen demonstrate gas and stool scattered throughout the colon extending to the level of the distal rectum.  There are some borderline and mildly dilated loops of gas- filled small bowel (up to 4.2 cm in diameter) throughout the central and upper abdomen.  No definite air-fluid levels are noted on the upright projection.  IMPRESSION: 1.  Nonspecific bowel gas pattern, as above.  Findings may simply reflect an ileus, however, clinical correlation for signs and symptoms of early or partial small bowel obstruction is recommended. 2.  No pneumoperitoneum. 3.  No radiographic evidence of acute cardiopulmonary disease. 4.  Mild cardiomegaly is unchanged. 5.  Support apparatus, as above.  Original Report Authenticated By: Etheleen Mayhew, M.D.    Review of Systems  Constitutional: Positive for chills and malaise/fatigue. Negative for fever, weight loss  and diaphoresis.  HENT: Negative.  Negative for neck pain.   Eyes: Negative.   Respiratory: Positive for shortness of breath. Negative for cough, hemoptysis, sputum production and wheezing.        Due to limited inspiration by abdominal distention/constipation  Cardiovascular: Negative.   Gastrointestinal: Positive for nausea, abdominal pain, diarrhea and constipation. Negative for heartburn, vomiting, blood in stool and melena.  Genitourinary: Negative.   Musculoskeletal: Positive for back pain. Negative for myalgias, joint pain and falls.  Skin: Negative.   Neurological: Negative.  Negative for weakness.  Endo/Heme/Allergies: Negative.   Psychiatric/Behavioral: Negative.   All other systems reviewed and are negative.   Blood pressure 109/64, pulse 80, temperature 98.3 F (36.8 C), temperature source Oral, resp. rate 20, height 5\' 8"  (1.727 m), weight 117.4 kg (258 lb 13.1 oz), SpO2 100.00%. Physical Exam  Nursing note and vitals reviewed. Constitutional: He is oriented to person, place, and time. He appears well-developed and well-nourished. No  distress.  HENT:  Head: Normocephalic and atraumatic.  Mouth/Throat: Oropharynx is clear and moist. No oropharyngeal exudate.  Eyes: Conjunctivae and EOM are normal. Pupils are equal, round, and reactive to light. Right eye exhibits no discharge. Left eye exhibits no discharge. No scleral icterus.  Neck: Normal range of motion. Neck supple. JVD present. No tracheal deviation present. No thyromegaly present.       JVP 7 cm  Cardiovascular: Normal rate, regular rhythm, normal heart sounds and intact distal pulses.  Exam reveals no gallop and no friction rub.   No murmur heard. Respiratory: No stridor. No respiratory distress. He has no wheezes. He has rales. He exhibits no tenderness.  GI: He exhibits distension. He exhibits no mass. There is tenderness. There is no rebound and no guarding.  Musculoskeletal: Normal range of motion. He exhibits  edema. He exhibits no tenderness.       Left lower extremity status post BKA in prosthesis. Maturing right upper arm arteriovenous fistula palpable best above the antecubital space  Lymphadenopathy:    He has no cervical adenopathy.  Neurological: He is alert and oriented to person, place, and time. He displays normal reflexes. No cranial nerve deficit. He exhibits normal muscle tone. Coordination normal.  Skin: Skin is warm and dry. No rash noted. He is not diaphoretic. No erythema.  Psychiatric: He has a normal mood and affect. His behavior is normal.    Assessment/Plan: 1. Constipation/abdominal pain and discomfort unresponsive to outpatient therapy: Started on sorbitol by the hospitalist service and CT scan results reviewed-no acute obstruction. May need enema if unresponsive to oral therapy. Continue cyclosporine at current doses with the aim for slow taper as an outpatient. 2. End-stage renal disease: We'll resume hemodialysis on a Tuesday, Thursday and Saturday schedule with dialysis today. Aim to get to estimated dry weight of 115 kg. 3. Anemia of chronic disease: Resume Epogen/Aranesp while here at the hospital. Iron panel will be rechecked. 4. Metabolic bone disease: Continue Zemplar, calcium acetate and monitore calcium/phosphorus levels. 5. Hypertension: Restart labetalol and Imdur that he was taking as an outpatient. 6. Insulin-dependent diabetes mellitus: Prior note reviewed, patient desires not to use his insulin pump in the hospital and wants adjustment with basal/sliding scale therapy.  Emmanuelle Coxe K. 08/23/2011, 11:28 AM

## 2011-08-23 NOTE — Procedures (Signed)
I was present at this session.  I have reviewed the session itself and made appropriate changes.  Dragon Thrush K. 4/30/20133:48 PM

## 2011-08-24 DIAGNOSIS — E1165 Type 2 diabetes mellitus with hyperglycemia: Secondary | ICD-10-CM

## 2011-08-24 DIAGNOSIS — IMO0002 Reserved for concepts with insufficient information to code with codable children: Secondary | ICD-10-CM

## 2011-08-24 DIAGNOSIS — E118 Type 2 diabetes mellitus with unspecified complications: Secondary | ICD-10-CM

## 2011-08-24 DIAGNOSIS — N186 End stage renal disease: Secondary | ICD-10-CM

## 2011-08-24 DIAGNOSIS — K5900 Constipation, unspecified: Secondary | ICD-10-CM

## 2011-08-24 LAB — BASIC METABOLIC PANEL
BUN: 12 mg/dL (ref 6–23)
CO2: 27 mEq/L (ref 19–32)
Calcium: 8.5 mg/dL (ref 8.4–10.5)
Chloride: 97 mEq/L (ref 96–112)
Creatinine, Ser: 2.19 mg/dL — ABNORMAL HIGH (ref 0.50–1.35)
GFR calc Af Amer: 40 mL/min — ABNORMAL LOW (ref >90)
GFR calc non Af Amer: 35 mL/min — ABNORMAL LOW (ref >90)
Glucose, Bld: 269 mg/dL — ABNORMAL HIGH (ref 70–99)
Potassium: 4.3 mEq/L (ref 3.5–5.1)
Sodium: 130 mEq/L — ABNORMAL LOW (ref 135–145)

## 2011-08-24 LAB — CBC
HCT: 30.9 % — ABNORMAL LOW (ref 39.0–52.0)
Hemoglobin: 9.2 g/dL — ABNORMAL LOW (ref 13.0–17.0)
MCH: 27.3 pg (ref 26.0–34.0)
MCHC: 29.8 g/dL — ABNORMAL LOW (ref 30.0–36.0)
MCV: 91.7 fL (ref 78.0–100.0)
Platelets: 170 10*3/uL (ref 150–400)
RBC: 3.37 MIL/uL — ABNORMAL LOW (ref 4.22–5.81)
RDW: 19.8 % — ABNORMAL HIGH (ref 11.5–15.5)
WBC: 6.9 10*3/uL (ref 4.0–10.5)

## 2011-08-24 LAB — GLUCOSE, CAPILLARY
Glucose-Capillary: 250 mg/dL — ABNORMAL HIGH (ref 70–99)
Glucose-Capillary: 287 mg/dL — ABNORMAL HIGH (ref 70–99)
Glucose-Capillary: 160 mg/dL — ABNORMAL HIGH (ref 70–99)
Glucose-Capillary: 93 mg/dL (ref 70–99)
Glucose-Capillary: 76 mg/dL (ref 70–99)

## 2011-08-24 MED ORDER — HEPARIN SODIUM (PORCINE) 1000 UNIT/ML DIALYSIS: 40.0000 [IU]/kg | INTRAMUSCULAR | Status: DC | PRN

## 2011-08-24 MED ORDER — DEXTROSE 5 % IV SOLN
1.0000 g | INTRAVENOUS | Status: DC
  Administered 2011-08-24 – 2011-08-25 (×2): 1 g via INTRAVENOUS
  Filled 2011-08-24 (×3): qty 10

## 2011-08-24 MED ORDER — DOXYCYCLINE HYCLATE 100 MG IV SOLR
100.0000 mg | Freq: Two times a day (BID) | INTRAVENOUS | Status: DC
  Administered 2011-08-24 – 2011-08-26 (×5): 100 mg via INTRAVENOUS
  Filled 2011-08-24 (×7): qty 100

## 2011-08-24 NOTE — Progress Notes (Signed)
Inpatient Diabetes Program Recommendations  AACE/ADA: New Consensus Statement on Inpatient Glycemic Control (2009)  Target Ranges:  Prepandial:   less than 140 mg/dL      Peak postprandial:   less than 180 mg/dL (1-2 hours)      Critically ill patients:  140 - 180 mg/dL    Results for CROSS, GOLDBECK (MRN [id number]) as of 08/24/2011 08:45  Ref. Range 08/24/2011 06:09  Glucose-Capillary Latest Range: 70-99 mg/dL 287 (H)    Fasting sugar elevated this morning.  Recommend we slowly increase patient's basal insulin dose (patient gets ~38 units total basal insulin on his insulin pump).  Inpatient Diabetes Program Recommendations Insulin - Basal: Please increase Lantus to 30 units daily.  Recommend we also continue his Sensitive SSI.   Patient will likely need meal coverage once he starts eating a solid diet. Recommend we start with 3 units Novolog tid with meals and titrate as needed.  Note: Will follow. Wyn Quaker RN, MSN, CDE Diabetes Coordinator Inpatient Diabetes Program [phone]

## 2011-08-24 NOTE — Progress Notes (Signed)
Patient ID: Dale [name], male   DOB: 05-30-1965, 46 y.o.   MRN: [id number]   [facility] Progress Note    Subjective:   Reports some loose stools overnight and resolution of abdominal pain. Complains of intermittent back pain and still feeling "full in the abdomen"   Objective:   BP 126/68  Pulse 84  Temp(Src) 98.7 F (37.1 C) (Oral)  Resp 20  Ht 5\' 8"  (1.727 m)  Wt 118.434 kg (261 lb 1.6 oz)  BMI 39.70 kg/m2  SpO2 97%  Physical Exam: [id number] sitting up in bed. [id number] RRR, Normal S1 and S2- no murmur/gallops Resp:CTA bilaterally, no rales/rhonchi [id number], flat, non-tender, BS normal Ext:1+-2+ LE edema.   Labs: BMET  Lab 08/24/11 0500 08/23/11 0228 08/22/11 0746  NA 130* 134* 134*  K 4.3 4.4 4.0  CL 97 99 98  CO2 27 -- 30  GLUCOSE 269* 124* 98  BUN 12 21 19   CREATININE 2.19* 2.10* 2.15*  ALB -- -- --  CALCIUM 8.5 -- 9.4  PHOS -- -- --   CBC  Lab 08/24/11 0500 08/23/11 0228 08/23/11 0151 08/22/11 0746  WBC 6.9 -- 8.2 8.1  NEUTROABS -- -- -- 4.6  HGB 9.2* 10.5* 10.1* 9.8*  HCT 30.9* 31.0* 33.6* 32.7*  MCV 91.7 -- 91.1 91.3  PLT 170 -- 200 238    @IMGRELPRIORS @ Medications:      . aspirin EC  81 mg Oral Daily  . bisacodyl  10 mg Rectal Daily  . calcium acetate  1,334 mg Oral TID WC  . cefTRIAXone (ROCEPHIN)  IV  1 g Intravenous Q24H  . cycloSPORINE modified  100 mg Oral BID  . darbepoetin (ARANESP) injection - DIALYSIS  200 mcg Intravenous Q Tue-HD  . doxycycline (VIBRAMYCIN) IV  100 mg Intravenous Q12H  . famotidine  10 mg Oral Daily  . insulin aspart  0-5 Units Subcutaneous QHS  . insulin aspart  0-9 Units Subcutaneous TID WC  . insulin glargine  25 Units Subcutaneous Daily  . isosorbide mononitrate  60 mg Oral Daily  . labetalol  300 mg Oral BID  . lactulose  20 g Oral TID  . levothyroxine  75 mcg Oral QAC breakfast  . metoCLOPramide  5 mg Oral TID PC & HS  . paricalcitol  2 mcg Intravenous Q T,Th,Sa-HD  . predniSONE   5 mg Oral Q breakfast  . rOPINIRole  1 mg Oral QHS  . senna-docusate  2 tablet Oral Daily  . sorbitol  30 mL Oral BID  . sorbitol, milk of mag, mineral oil, glycerin (SMOG) enema  960 mL Rectal Once     Assessment/ Plan:   1. Constipation/abdominal pain and discomfort unresponsive to outpatient therapy: Started on sorbitol by the hospitalist service and appears to be responsive. Will try a tap water or soap and suds enema as well.  2. End-stage renal disease: We'll continue hemodialysis on a Tuesday, Thursday and Saturday schedule with dialysis tomorrow. Aim to get to estimated dry weight of 115 kg.  3. Anemia of chronic disease: Resume Epogen/Aranesp while here at the hospital. Iron panel will be rechecked.  4. Metabolic bone disease: Continue Zemplar, calcium acetate and monitore calcium/phosphorus levels.  5. Hypertension: On labetalol and Imdur that he was taking as an outpatient.  6. Insulin-dependent diabetes mellitus: Patient desires not to use his insulin pump in the hospital and is being controlled with basal/sliding scale insulin therapy.    Elmarie Shiley, MD 08/24/2011, 3:26 PM

## 2011-08-24 NOTE — Progress Notes (Signed)
Subjective: Slightly sob on walking.   Objective: Weight change:   Intake/Output Summary (Last 24 hours) at 08/24/11 1316 Last data filed at 08/24/11 0637  Gross per 24 hour  Intake    362 ml  Output   1295 ml  Net   -933 ml    Filed Vitals:   08/24/11 1038  BP: 145/69  Pulse: 88  Temp:   Resp:   General: Alert, awake, oriented x3, appears comfortable  noacute distress. Heart: Regular rate and rhythm, without murmurs, rubs or gallops.  Lungs: Clear to auscultation bilaterally, no wheezing, rales or rhonchi.  Abdomen: Soft, but distended, minimal tenderness, obese, hypoactive bowel sounds  Extremities: No clubbing, cyanosis or edema with positive pedal pulses, left BKA.  Lab Results: Results for orders placed during the hospital encounter of 08/22/11 (from the past 24 hour(s))  GLUCOSE, CAPILLARY     Status: Abnormal   Collection Time   08/23/11  7:14 PM      Component Value Range   Glucose-Capillary 144 (*) 70 - 99 (mg/dL)  GLUCOSE, CAPILLARY     Status: Abnormal   Collection Time   08/23/11  9:22 PM      Component Value Range   Glucose-Capillary 157 (*) 70 - 99 (mg/dL)  GLUCOSE, CAPILLARY     Status: Abnormal   Collection Time   08/24/11  1:31 AM      Component Value Range   Glucose-Capillary 250 (*) 70 - 99 (mg/dL)  BASIC METABOLIC PANEL     Status: Abnormal   Collection Time   08/24/11  5:00 AM      Component Value Range   Sodium 130 (*) 135 - 145 (mEq/L)   Potassium 4.3  3.5 - 5.1 (mEq/L)   Chloride 97  96 - 112 (mEq/L)   CO2 27  19 - 32 (mEq/L)   Glucose, Bld 269 (*) 70 - 99 (mg/dL)   BUN 12  6 - 23 (mg/dL)   Creatinine, Ser 2.19 (*) 0.50 - 1.35 (mg/dL)   Calcium 8.5  8.4 - 10.5 (mg/dL)   GFR calc non Af Amer 35 (*) >90 (mL/min)   GFR calc Af Amer 40 (*) >90 (mL/min)  CBC     Status: Abnormal   Collection Time   08/24/11  5:00 AM      Component Value Range   WBC 6.9  4.0 - 10.5 (K/uL)   RBC 3.37 (*) 4.22 - 5.81 (MIL/uL)   Hemoglobin 9.2 (*) 13.0 - 17.0  (g/dL)   HCT 30.9 (*) 39.0 - 52.0 (%)   MCV 91.7  78.0 - 100.0 (fL)   MCH 27.3  26.0 - 34.0 (pg)   MCHC 29.8 (*) 30.0 - 36.0 (g/dL)   RDW 19.8 (*) 11.5 - 15.5 (%)   Platelets 170  150 - 400 (K/uL)  GLUCOSE, CAPILLARY     Status: Abnormal   Collection Time   08/24/11  6:09 AM      Component Value Range   Glucose-Capillary 287 (*) 70 - 99 (mg/dL)   Comment 1 Notify RN    GLUCOSE, CAPILLARY     Status: Abnormal   Collection Time   08/24/11 11:19 AM      Component Value Range   Glucose-Capillary 160 (*) 70 - 99 (mg/dL)   Comment 1 Documented in Chart     Comment 2 Notify RN    GLUCOSE, CAPILLARY     Status: Normal   Collection Time   08/24/11  4:27 PM  Component Value Range   Glucose-Capillary 93  70 - 99 (mg/dL)   Comment 1 Documented in Chart     Comment 2 Notify RN       Micro Results: Recent Results (from the past 240 hour(s))  MRSA PCR SCREENING     Status: Normal   Collection Time   08/23/11  9:42 AM      Component Value Range Status Comment   MRSA by PCR NEGATIVE  NEGATIVE  Final     Studies/Results: Ct Abdomen Pelvis Wo Contrast  08/22/2011  *RADIOLOGY REPORT*  Clinical Data: Abdominal distention, constipation  CT ABDOMEN AND PELVIS WITHOUT CONTRAST  Technique:  Multidetector CT imaging of the abdomen and pelvis was performed following the standard protocol without intravenous contrast.  Comparison: 08/06/2011  Findings: There is small pericardial effusion measures 7 mm maximum thickness.  Atherosclerotic calcifications of the coronary arteries. Small left pleural effusion with left base posterior atelectasis.  NG tube with tip coiled within stomach.  Sagittal images of the spine shows degenerative changes lower thoracic spine.  The study is limited without IV contrast.  The unenhanced liver is unremarkable. Unenhanced pancreas, spleen and adrenals are unremarkable. Again noted renal atrophy and chronic renal cortical thinning. Vascular calcifications bilateral kidney are  stable.  Again noted multiple enlarged retroperitoneal and mesenteric lymph nodes.  A left periaortic lymph node in axial image 37 measures 2 x 1.6 cm.  Moderate stool noted in the right colon.  Small amount of layering sludge  or  tiny gallstones are suspected within dependent gallbladder.  Atherosclerotic calcifications are again noted SMA, splenic artery bilateral renal artery origin.  Again noted a transplanted kidney in the left iliac fossa stable in appearance without evidence of hydronephrosis or hydroureter.  Mild distended urinary bladder.  Oral contrast material was given to the patient.  No small bowel or colonic obstruction.  Enlarged lymph nodes bilateral inguinal region are again noted.  The largest right inguinal lymph node measures 1.7 x 1.9 cm.  The largest left inguinal lymph node measures 1.7 x 1.5 cm.  There is a fluid collection in the left inguinal region measures 12 HU in attenuation.  This measures 4 x 3.3 cm.  May represent a resolving seroma or hematoma.  There is thickening of the skin anterior abdominal wall and mild subcutaneous anasarca infiltration of the subcutaneous fat.  A small midline lower ventral hernia containing fat measures 3.5 cm only partially visualized.  Postsurgical changes are noted right inguinal region.  No destructive bony lesions are noted within pelvis.  IMPRESSION:  1.  There is small pericardial effusion.  Small left pleural effusion with left base posteriorly atelectasis. 2.  Again noted atrophic native kidneys.  Stable transplanted kidney in the left iliac fossa.  No hydronephrosis or hydroureter. 3.  Again noted retroperitoneal and mesenteric enlarged lymph nodes.  Bilateral enlarged inguinal lymph nodes again noted. 4.  There is a small fluid collection in the left inguinal region measures 4 x 3.3 cm.  This may represent a seroma or resolving hematoma.  Less likely inguinal abscess. 5.  Moderate stool noted in the right colon.  No small bowel or colonic  obstruction.  Original Report Authenticated By: Lahoma Crocker, M.D.   Ct Abdomen Pelvis Wo Contrast  08/04/2011  *RADIOLOGY REPORT*  Clinical Data: Recurrent renal failure requiring hemodialysis. History of renal transplant.  CT ABDOMEN AND PELVIS WITHOUT CONTRAST  Technique:  Multidetector CT imaging of the abdomen and pelvis was performed following the standard  protocol without intravenous contrast.  Comparison: Right upper quadrant abdominal ultrasound 07/14/2011. Abdominal CT 08/26/2004.  Findings: Images through the lung bases demonstrate cardiomegaly and hemodialysis catheters within the right atrium.  There is vascular congestion and mild bibasilar atelectasis.  No focal abnormalities are seen within the liver, spleen, gallbladder, pancreas or adrenal glands.  The spleen is slightly larger than on the prior CT, but without focal abnormality.  There is severe chronic cortical thinning of the native kidneys bilaterally.  Appearance is unchanged.  The transplanted kidney in the left iliac fossa appears stable without hydronephrosis, significant cortical thinning or surrounding inflammatory change.  The patient has developed multiple enlarged retroperitoneal and mesenteric lymph nodes.  Examples include an 11 mm right retrocrural node on image 16, a mesenteric node measuring 3.6 x 2.3 cm on image 31, a left periaortic node measuring 2.3 x 3.1 cm on image 46 and a 2.0 x 3.8 cm left external iliac node on image 79. In addition, there are several mildly enlarged lymph nodes within the left groin.  There are postsurgical changes in the right groin without significant adenopathy.  No bowel lesions are identified.  There is no evidence of bowel obstruction.  The bladder contents demonstrate mildly increased density.  There is no evidence of diskitis or other acute osseous abnormality.  IMPRESSION:  1.  Interval development of multi focal lymphadenopathy suspicious for lymphoma.  Enlarged lymph nodes in the left groin  are most accessible for excisional biopsy. 2.  Slight interval increase in splenic size without significant splenomegaly or focal abnormality. 3.  Transplanted kidney in the left iliac fossa has a stable appearance without hydronephrosis or perinephric soft tissue stranding.  Chronic atrophy of the native kidneys appears unchanged. 4.  High density urine within the urinary bladder.  Original Report Authenticated By: Vivia Ewing, M.D.   Dg Chest 2 View  08/02/2011  *RADIOLOGY REPORT*  Clinical Data: History of difficulty breathing.  History of fever.  CHEST - 2 VIEW  Comparison: 07/22/2011.  08/01/2011.  Findings: Right sided dialysis catheter has tips in right atrial region.  There is stable cardiac silhouette enlargement.  There is upper lobe vascular prominence with pulmonary venous hypertension vascular congestion pattern.  No consolidation or definite pleural effusion is seen.  There is slight elevation of the margin of the left hemidiaphragm with minimal basilar atelectasis. On the lateral image there appears to be some posterior atelectasis and patchy infiltrative density without consolidation.  There is minimal degenerative spondylosis.  IMPRESSION: Dialysis catheter in place.  Stable cardiac silhouette enlargement. Pulmonary venous hypertension vascular congestion pattern.  No consolidation or pleural effusion. Left basilar and posterior atelectasis and patchy infiltrative densities without consolidation.  Original Report Authenticated By: Delane Ginger, M.D.   US Biopsy  08/05/2011  *RADIOLOGY REPORT*  Clinical history:46 year old with history of renal transplant and fevers.  The patient has lymphadenopathy in the retroperitoneum and left inguinal region.  PROCEDURE(S): ULTRASOUND GUIDED BIOPSY OF LEFT INGUINAL LYMPH NODE.  Physician: Stephan Minister. Henn, MD  Medications:None  Moderate sedation time:None  Fluoroscopy time: None  Procedure:The procedure was explained to the patient.  The risks and benefits  of the procedure were discussed and the patient's questions were addressed.  Informed consent was obtained from the patient.  The left inguinal area was evaluated with ultrasound. Enlarged lymph nodes were identified.  Prominent lymph node anterior to the saphenofemoral junction of the left common femoral vein was targeted.  The left groin was prepped and draped in  a sterile fashion.  The skin was anesthetized with 1% lidocaine.  18 gauge core needle was directed into the lymph node with ultrasound guidance.  Five core biopsies were obtained.  Samples placed in saline.  Specimens sent for pathology and microbiology.  Findings:Multiple enlarged left inguinal lymph nodes.  Prominent lymph node anterior to the saphenofemoral junction was biopsied. No significant bleeding following the core biopsies.  Complications: None  Impression:Ultrasound guided core biopsies of a large left inguinal lymph node.  Samples sent for pathology and microbiology.  Original Report Authenticated By: Markus Daft, M.D.   Dg Chest Portable 1 View  08/23/2011  *RADIOLOGY REPORT*  Clinical Data: Shortness of breath  PORTABLE CHEST - 1 VIEW  Comparison: 08/03/2011  Findings: Cardiomegaly.  Central vascular congestion. Mild opacity at the left lung base.  Otherwise, no focal consolidation.  No overt edema.  No pleural effusion or pneumothorax.  Large bore right subclavian catheter tip projects over the cavoatrial junction.  No acute osseous abnormality identified.  IMPRESSION: Cardiomegaly.  Vascular congestion without overt edema.  Mild left lung base opacity; atelectasis versus infiltrate.  Original Report Authenticated By: Suanne Marker, M.D.   Dg Chest Port 1 View  08/03/2011  *RADIOLOGY REPORT*  Clinical Data: Cough and shortness of breath.  PORTABLE CHEST - 1 VIEW  Comparison: 08/12/2011.  Findings: The diatek catheter is stable.  The heart is enlarged but stable.  The lungs are better aerated with resolving edema.  No effusions.   IMPRESSION: Slight improved lung aeration.  Vascular congestion without pulmonary edema.  Original Report Authenticated By: P. Kalman Jewels, M.D.   Dg Chest Port 1 View  08/01/2011  *RADIOLOGY REPORT*  Clinical Data: Central line placement.  PORTABLE CHEST - 1 VIEW  Comparison: 07/22/2011.  Findings: 1504 hours.  Right IJ dialysis catheter tips are present within the right atrium.  There is stable cardiomegaly with chronic vascular congestion.  No confluent airspace opacity, significant pleural effusion or pneumothorax is seen.  IMPRESSION: Dialysis catheter placement without demonstrated pneumothorax. Stable cardiomegaly and vascular congestion.  Original Report Authenticated By: Vivia Ewing, M.D.   Dg Abd Acute W/chest  08/22/2011  *RADIOLOGY REPORT*  Clinical Data: Abdominal pain and constipation.  ACUTE ABDOMEN SERIES (ABDOMEN 2 VIEW & CHEST 1 VIEW)  Comparison: Chest x-ray 08/03/2011.  Abdominal x-ray 04/06/2008.  Findings: There is a right internal jugular PermCath with the tips in the right atrium.  Lung volumes are normal.  Minimal dependent bibasilar opacities are favored to reflect areas of subsegmental atelectasis.  No definite pleural effusions.  Mild pulmonary venous congestion without frank pulmonary edema.  Heart size is mildly enlarged (unchanged).  Mediastinal contours are unremarkable. Atherosclerotic calcifications within the arch of the aorta.  No pneumoperitoneum  Supine and upright views of the abdomen demonstrate gas and stool scattered throughout the colon extending to the level of the distal rectum.  There are some borderline and mildly dilated loops of gas- filled small bowel (up to 4.2 cm in diameter) throughout the central and upper abdomen.  No definite air-fluid levels are noted on the upright projection.  IMPRESSION: 1.  Nonspecific bowel gas pattern, as above.  Findings may simply reflect an ileus, however, clinical correlation for signs and symptoms of early or partial  small bowel obstruction is recommended. 2.  No pneumoperitoneum. 3.  No radiographic evidence of acute cardiopulmonary disease. 4.  Mild cardiomegaly is unchanged. 5.  Support apparatus, as above.  Original Report Authenticated By: Etheleen Mayhew, M.D.  Dg Fluoro Guide Cv Line-no Report  08/01/2011  CLINICAL DATA: dialysis catheter insertion   FLOURO GUIDE CV LINE  Fluoroscopy was utilized by the requesting physician.  No radiographic  interpretation.     Medications: Scheduled Meds:   . aspirin EC  81 mg Oral Daily  . bisacodyl  10 mg Rectal Daily  . calcium acetate  1,334 mg Oral TID WC  . cefTRIAXone (ROCEPHIN)  IV  1 g Intravenous Q24H  . cycloSPORINE modified  100 mg Oral BID  . darbepoetin (ARANESP) injection - DIALYSIS  200 mcg Intravenous Q Tue-HD  . doxycycline (VIBRAMYCIN) IV  100 mg Intravenous Q12H  . famotidine  10 mg Oral Daily  . insulin aspart  0-5 Units Subcutaneous QHS  . insulin aspart  0-9 Units Subcutaneous TID WC  . insulin glargine  25 Units Subcutaneous Daily  . isosorbide mononitrate  60 mg Oral Daily  . labetalol  300 mg Oral BID  . lactulose  20 g Oral TID  . levothyroxine  75 mcg Oral QAC breakfast  . metoCLOPramide  5 mg Oral TID PC & HS  . paricalcitol  2 mcg Intravenous Q T,Th,Sa-HD  . predniSONE  5 mg Oral Q breakfast  . rOPINIRole  1 mg Oral QHS  . senna-docusate  2 tablet Oral Daily  . sorbitol  30 mL Oral BID  . sorbitol, milk of mag, mineral oil, glycerin (SMOG) enema  960 mL Rectal Once   Continuous Infusions:  PRN Meds:.sodium chloride, sodium chloride, sodium chloride, acetaminophen, acetaminophen, albuterol, alteplase, alum & mag hydroxide-simeth, feeding supplement (NEPRO CARB STEADY), heparin, heparin, HYDROcodone-acetaminophen, HYDROmorphone, lidocaine, lidocaine-prilocaine, ondansetron (ZOFRAN) IV, ondansetron, pentafluoroprop-tetrafluoroeth  Assessment/Plan: Patient Active Hospital Problem List: Constipation, acute  (08/23/2011) Slowly improving. Continue with enemas as needed.  CKD (chronic kidney disease) (07/22/2011)   Further treatment as per Renal  History of renal transplant (07/22/2011) Renal input appreciated   Ostructive sleep apnea (07/22/2011) CPAP at night   CHF (congestive heart failure) (07/22/2011)  Anemia (07/22/2011)  STABLE AT BASELINE     Diabetes Mellitus (07/22/2011)   lantus and SSI  CBG (last 3)   Basename 08/24/11 1627 08/24/11 1119 08/24/11 0609  GLUCAP 93 160* 287*   Dyspnea: cxr shows possible left sided opacity, started the patient on rocephin and doxycycline.   Disposition : possibly tomorrow after dialysis.    LOS: 2 days   Dale [name] 08/24/2011, 1:16 PM

## 2011-08-24 NOTE — Progress Notes (Signed)
Pt complaining of increasing numbness in left hand with acute onset.  Pt denies any SOB or chest pain at this time.  Pt does complain of chronic back pain.  MD has been made aware.  Will continue to monitor. Gae Gallop RN

## 2011-08-25 ENCOUNTER — Inpatient Hospital Stay (HOSPITAL_COMMUNITY): Payer: Medicare Other

## 2011-08-25 DIAGNOSIS — E1165 Type 2 diabetes mellitus with hyperglycemia: Secondary | ICD-10-CM

## 2011-08-25 DIAGNOSIS — K5900 Constipation, unspecified: Secondary | ICD-10-CM

## 2011-08-25 DIAGNOSIS — E118 Type 2 diabetes mellitus with unspecified complications: Secondary | ICD-10-CM

## 2011-08-25 DIAGNOSIS — N186 End stage renal disease: Secondary | ICD-10-CM

## 2011-08-25 DIAGNOSIS — IMO0002 Reserved for concepts with insufficient information to code with codable children: Secondary | ICD-10-CM

## 2011-08-25 LAB — CBC
HCT: 28.1 % — ABNORMAL LOW (ref 39.0–52.0)
Hemoglobin: 8.2 g/dL — ABNORMAL LOW (ref 13.0–17.0)
MCH: 27 pg (ref 26.0–34.0)
MCHC: 29.2 g/dL — ABNORMAL LOW (ref 30.0–36.0)
MCV: 92.4 fL (ref 78.0–100.0)
Platelets: 145 10*3/uL — ABNORMAL LOW (ref 150–400)
RBC: 3.04 MIL/uL — ABNORMAL LOW (ref 4.22–5.81)
RDW: 20.1 % — ABNORMAL HIGH (ref 11.5–15.5)
WBC: 7.1 10*3/uL (ref 4.0–10.5)

## 2011-08-25 LAB — BASIC METABOLIC PANEL
BUN: 17 mg/dL (ref 6–23)
CO2: 26 mEq/L (ref 19–32)
Calcium: 8.5 mg/dL (ref 8.4–10.5)
Chloride: 100 mEq/L (ref 96–112)
Creatinine, Ser: 3.13 mg/dL — ABNORMAL HIGH (ref 0.50–1.35)
GFR calc Af Amer: 26 mL/min — ABNORMAL LOW (ref >90)
GFR calc non Af Amer: 22 mL/min — ABNORMAL LOW (ref >90)
Glucose, Bld: 105 mg/dL — ABNORMAL HIGH (ref 70–99)
Potassium: 3.9 mEq/L (ref 3.5–5.1)
Sodium: 135 mEq/L (ref 135–145)

## 2011-08-25 LAB — GLUCOSE, CAPILLARY
Glucose-Capillary: 113 mg/dL — ABNORMAL HIGH (ref 70–99)
Glucose-Capillary: 100 mg/dL — ABNORMAL HIGH (ref 70–99)
Glucose-Capillary: 189 mg/dL — ABNORMAL HIGH (ref 70–99)

## 2011-08-25 MED ORDER — PARICALCITOL 5 MCG/ML IV SOLN
INTRAVENOUS | Status: AC
  Administered 2011-08-25: 2 ug via INTRAVENOUS
  Filled 2011-08-25: qty 1

## 2011-08-25 NOTE — Procedures (Signed)
I was present at this session.  I have reviewed the session itself and made appropriate changes.  Heloise Gordan K. 5/2/20139:06 AM

## 2011-08-25 NOTE — Progress Notes (Signed)
Subjective: No BM today  Objective: Weight change: 2.259 kg (4 lb 15.7 oz)  Intake/Output Summary (Last 24 hours) at 08/25/11 1828 Last data filed at 08/25/11 1553  Gross per 24 hour  Intake    860 ml  Output   2183 ml  Net  -1323 ml    Filed Vitals:   08/25/11 1500  BP:   Pulse:   Temp: 89.6 F (32 C)  Resp:    General: Alert, awake, oriented x3, appears comfortable noacute distress.  Heart: Regular rate and rhythm, without murmurs, rubs or gallops.  Lungs: Clear to auscultation bilaterally, no wheezing, rales or rhonchi.  Abdomen: Soft, but distended, minimal tenderness, obese, hypoactive bowel sounds  Extremities: No clubbing, cyanosis or edema with positive pedal pulses, left BKA.   Lab Results: Results for orders placed during the hospital encounter of 08/22/11 (from the past 24 hour(s))  GLUCOSE, CAPILLARY     Status: Normal   Collection Time   08/24/11  8:53 PM      Component Value Range   Glucose-Capillary 76  70 - 99 (mg/dL)  GLUCOSE, CAPILLARY     Status: Abnormal   Collection Time   08/25/11  6:18 AM      Component Value Range   Glucose-Capillary 113 (*) 70 - 99 (mg/dL)   Comment 1 Notify RN     Comment 2 Documented in Chart    CBC     Status: Abnormal   Collection Time   08/25/11  7:30 AM      Component Value Range   WBC 7.1  4.0 - 10.5 (K/uL)   RBC 3.04 (*) 4.22 - 5.81 (MIL/uL)   Hemoglobin 8.2 (*) 13.0 - 17.0 (g/dL)   HCT 28.1 (*) 39.0 - 52.0 (%)   MCV 92.4  78.0 - 100.0 (fL)   MCH 27.0  26.0 - 34.0 (pg)   MCHC 29.2 (*) 30.0 - 36.0 (g/dL)   RDW 20.1 (*) 11.5 - 15.5 (%)   Platelets 145 (*) 150 - 400 (K/uL)  BASIC METABOLIC PANEL     Status: Abnormal   Collection Time   08/25/11  7:30 AM      Component Value Range   Sodium 135  135 - 145 (mEq/L)   Potassium 3.9  3.5 - 5.1 (mEq/L)   Chloride 100  96 - 112 (mEq/L)   CO2 26  19 - 32 (mEq/L)   Glucose, Bld 105 (*) 70 - 99 (mg/dL)   BUN 17  6 - 23 (mg/dL)   Creatinine, Ser 3.13 (*) 0.50 - 1.35 (mg/dL)     Calcium 8.5  8.4 - 10.5 (mg/dL)   GFR calc non Af Amer 22 (*) >90 (mL/min)   GFR calc Af Amer 26 (*) >90 (mL/min)  GLUCOSE, CAPILLARY     Status: Abnormal   Collection Time   08/25/11 12:11 PM      Component Value Range   Glucose-Capillary 100 (*) 70 - 99 (mg/dL)  GLUCOSE, CAPILLARY     Status: Abnormal   Collection Time   08/25/11  4:15 PM      Component Value Range   Glucose-Capillary 189 (*) 70 - 99 (mg/dL)   Comment 1 Notify RN       Micro Results: Recent Results (from the past 240 hour(s))  MRSA PCR SCREENING     Status: Normal   Collection Time   08/23/11  9:42 AM      Component Value Range Status Comment   MRSA by PCR  NEGATIVE  NEGATIVE  Final     Studies/Results: Ct Abdomen Pelvis Wo Contrast  08/22/2011  *RADIOLOGY REPORT*  Clinical Data: Abdominal distention, constipation  CT ABDOMEN AND PELVIS WITHOUT CONTRAST  Technique:  Multidetector CT imaging of the abdomen and pelvis was performed following the standard protocol without intravenous contrast.  Comparison: 08/06/2011  Findings: There is small pericardial effusion measures 7 mm maximum thickness.  Atherosclerotic calcifications of the coronary arteries. Small left pleural effusion with left base posterior atelectasis.  NG tube with tip coiled within stomach.  Sagittal images of the spine shows degenerative changes lower thoracic spine.  The study is limited without IV contrast.  The unenhanced liver is unremarkable. Unenhanced pancreas, spleen and adrenals are unremarkable. Again noted renal atrophy and chronic renal cortical thinning. Vascular calcifications bilateral kidney are stable.  Again noted multiple enlarged retroperitoneal and mesenteric lymph nodes.  A left periaortic lymph node in axial image 37 measures 2 x 1.6 cm.  Moderate stool noted in the right colon.  Small amount of layering sludge  or  tiny gallstones are suspected within dependent gallbladder.  Atherosclerotic calcifications are again noted SMA, splenic  artery bilateral renal artery origin.  Again noted a transplanted kidney in the left iliac fossa stable in appearance without evidence of hydronephrosis or hydroureter.  Mild distended urinary bladder.  Oral contrast material was given to the patient.  No small bowel or colonic obstruction.  Enlarged lymph nodes bilateral inguinal region are again noted.  The largest right inguinal lymph node measures 1.7 x 1.9 cm.  The largest left inguinal lymph node measures 1.7 x 1.5 cm.  There is a fluid collection in the left inguinal region measures 12 HU in attenuation.  This measures 4 x 3.3 cm.  May represent a resolving seroma or hematoma.  There is thickening of the skin anterior abdominal wall and mild subcutaneous anasarca infiltration of the subcutaneous fat.  A small midline lower ventral hernia containing fat measures 3.5 cm only partially visualized.  Postsurgical changes are noted right inguinal region.  No destructive bony lesions are noted within pelvis.  IMPRESSION:  1.  There is small pericardial effusion.  Small left pleural effusion with left base posteriorly atelectasis. 2.  Again noted atrophic native kidneys.  Stable transplanted kidney in the left iliac fossa.  No hydronephrosis or hydroureter. 3.  Again noted retroperitoneal and mesenteric enlarged lymph nodes.  Bilateral enlarged inguinal lymph nodes again noted. 4.  There is a small fluid collection in the left inguinal region measures 4 x 3.3 cm.  This may represent a seroma or resolving hematoma.  Less likely inguinal abscess. 5.  Moderate stool noted in the right colon.  No small bowel or colonic obstruction.  Original Report Authenticated By: Lahoma Crocker, M.D.   Ct Abdomen Pelvis Wo Contrast  08/04/2011  *RADIOLOGY REPORT*  Clinical Data: Recurrent renal failure requiring hemodialysis. History of renal transplant.  CT ABDOMEN AND PELVIS WITHOUT CONTRAST  Technique:  Multidetector CT imaging of the abdomen and pelvis was performed following the  standard protocol without intravenous contrast.  Comparison: Right upper quadrant abdominal ultrasound 07/14/2011. Abdominal CT 08/26/2004.  Findings: Images through the lung bases demonstrate cardiomegaly and hemodialysis catheters within the right atrium.  There is vascular congestion and mild bibasilar atelectasis.  No focal abnormalities are seen within the liver, spleen, gallbladder, pancreas or adrenal glands.  The spleen is slightly larger than on the prior CT, but without focal abnormality.  There is severe chronic cortical thinning  of the native kidneys bilaterally.  Appearance is unchanged.  The transplanted kidney in the left iliac fossa appears stable without hydronephrosis, significant cortical thinning or surrounding inflammatory change.  The patient has developed multiple enlarged retroperitoneal and mesenteric lymph nodes.  Examples include an 11 mm right retrocrural node on image 16, a mesenteric node measuring 3.6 x 2.3 cm on image 31, a left periaortic node measuring 2.3 x 3.1 cm on image 46 and a 2.0 x 3.8 cm left external iliac node on image 79. In addition, there are several mildly enlarged lymph nodes within the left groin.  There are postsurgical changes in the right groin without significant adenopathy.  No bowel lesions are identified.  There is no evidence of bowel obstruction.  The bladder contents demonstrate mildly increased density.  There is no evidence of diskitis or other acute osseous abnormality.  IMPRESSION:  1.  Interval development of multi focal lymphadenopathy suspicious for lymphoma.  Enlarged lymph nodes in the left groin are most accessible for excisional biopsy. 2.  Slight interval increase in splenic size without significant splenomegaly or focal abnormality. 3.  Transplanted kidney in the left iliac fossa has a stable appearance without hydronephrosis or perinephric soft tissue stranding.  Chronic atrophy of the native kidneys appears unchanged. 4.  High density urine  within the urinary bladder.  Original Report Authenticated By: Vivia Ewing, M.D.   Dg Chest 2 View  08/02/2011  *RADIOLOGY REPORT*  Clinical Data: History of difficulty breathing.  History of fever.  CHEST - 2 VIEW  Comparison: 07/22/2011.  08/01/2011.  Findings: Right sided dialysis catheter has tips in right atrial region.  There is stable cardiac silhouette enlargement.  There is upper lobe vascular prominence with pulmonary venous hypertension vascular congestion pattern.  No consolidation or definite pleural effusion is seen.  There is slight elevation of the margin of the left hemidiaphragm with minimal basilar atelectasis. On the lateral image there appears to be some posterior atelectasis and patchy infiltrative density without consolidation.  There is minimal degenerative spondylosis.  IMPRESSION: Dialysis catheter in place.  Stable cardiac silhouette enlargement. Pulmonary venous hypertension vascular congestion pattern.  No consolidation or pleural effusion. Left basilar and posterior atelectasis and patchy infiltrative densities without consolidation.  Original Report Authenticated By: Delane Ginger, M.D.   US Biopsy  08/05/2011  *RADIOLOGY REPORT*  Clinical history:46 year old with history of renal transplant and fevers.  The patient has lymphadenopathy in the retroperitoneum and left inguinal region.  PROCEDURE(S): ULTRASOUND GUIDED BIOPSY OF LEFT INGUINAL LYMPH NODE.  Physician: Stephan Minister. Henn, MD  Medications:None  Moderate sedation time:None  Fluoroscopy time: None  Procedure:The procedure was explained to the patient.  The risks and benefits of the procedure were discussed and the patient's questions were addressed.  Informed consent was obtained from the patient.  The left inguinal area was evaluated with ultrasound. Enlarged lymph nodes were identified.  Prominent lymph node anterior to the saphenofemoral junction of the left common femoral vein was targeted.  The left groin was prepped and  draped in a sterile fashion.  The skin was anesthetized with 1% lidocaine.  18 gauge core needle was directed into the lymph node with ultrasound guidance.  Five core biopsies were obtained.  Samples placed in saline.  Specimens sent for pathology and microbiology.  Findings:Multiple enlarged left inguinal lymph nodes.  Prominent lymph node anterior to the saphenofemoral junction was biopsied. No significant bleeding following the core biopsies.  Complications: None  Impression:Ultrasound guided core biopsies of  a large left inguinal lymph node.  Samples sent for pathology and microbiology.  Original Report Authenticated By: Markus Daft, M.D.   Dg Chest Portable 1 View  08/23/2011  *RADIOLOGY REPORT*  Clinical Data: Shortness of breath  PORTABLE CHEST - 1 VIEW  Comparison: 08/03/2011  Findings: Cardiomegaly.  Central vascular congestion. Mild opacity at the left lung base.  Otherwise, no focal consolidation.  No overt edema.  No pleural effusion or pneumothorax.  Large bore right subclavian catheter tip projects over the cavoatrial junction.  No acute osseous abnormality identified.  IMPRESSION: Cardiomegaly.  Vascular congestion without overt edema.  Mild left lung base opacity; atelectasis versus infiltrate.  Original Report Authenticated By: Suanne Marker, M.D.   Dg Chest Port 1 View  08/03/2011  *RADIOLOGY REPORT*  Clinical Data: Cough and shortness of breath.  PORTABLE CHEST - 1 VIEW  Comparison: 08/12/2011.  Findings: The diatek catheter is stable.  The heart is enlarged but stable.  The lungs are better aerated with resolving edema.  No effusions.  IMPRESSION: Slight improved lung aeration.  Vascular congestion without pulmonary edema.  Original Report Authenticated By: P. Kalman Jewels, M.D.   Dg Chest Port 1 View  08/01/2011  *RADIOLOGY REPORT*  Clinical Data: Central line placement.  PORTABLE CHEST - 1 VIEW  Comparison: 07/22/2011.  Findings: 1504 hours.  Right IJ dialysis catheter tips are  present within the right atrium.  There is stable cardiomegaly with chronic vascular congestion.  No confluent airspace opacity, significant pleural effusion or pneumothorax is seen.  IMPRESSION: Dialysis catheter placement without demonstrated pneumothorax. Stable cardiomegaly and vascular congestion.  Original Report Authenticated By: Vivia Ewing, M.D.   Dg Abd Acute W/chest  08/22/2011  *RADIOLOGY REPORT*  Clinical Data: Abdominal pain and constipation.  ACUTE ABDOMEN SERIES (ABDOMEN 2 VIEW & CHEST 1 VIEW)  Comparison: Chest x-ray 08/03/2011.  Abdominal x-ray 04/06/2008.  Findings: There is a right internal jugular PermCath with the tips in the right atrium.  Lung volumes are normal.  Minimal dependent bibasilar opacities are favored to reflect areas of subsegmental atelectasis.  No definite pleural effusions.  Mild pulmonary venous congestion without frank pulmonary edema.  Heart size is mildly enlarged (unchanged).  Mediastinal contours are unremarkable. Atherosclerotic calcifications within the arch of the aorta.  No pneumoperitoneum  Supine and upright views of the abdomen demonstrate gas and stool scattered throughout the colon extending to the level of the distal rectum.  There are some borderline and mildly dilated loops of gas- filled small bowel (up to 4.2 cm in diameter) throughout the central and upper abdomen.  No definite air-fluid levels are noted on the upright projection.  IMPRESSION: 1.  Nonspecific bowel gas pattern, as above.  Findings may simply reflect an ileus, however, clinical correlation for signs and symptoms of early or partial small bowel obstruction is recommended. 2.  No pneumoperitoneum. 3.  No radiographic evidence of acute cardiopulmonary disease. 4.  Mild cardiomegaly is unchanged. 5.  Support apparatus, as above.  Original Report Authenticated By: Etheleen Mayhew, M.D.   Dg Fluoro Guide Cv Line-no Report  08/01/2011  CLINICAL DATA: dialysis catheter insertion    FLOURO GUIDE CV LINE  Fluoroscopy was utilized by the requesting physician.  No radiographic  interpretation.     Medications: Scheduled Meds:   . aspirin EC  81 mg Oral Daily  . bisacodyl  10 mg Rectal Daily  . calcium acetate  1,334 mg Oral TID WC  . cefTRIAXone (ROCEPHIN)  IV  1 g Intravenous Q24H  . cycloSPORINE modified  100 mg Oral BID  . darbepoetin (ARANESP) injection - DIALYSIS  200 mcg Intravenous Q Tue-HD  . doxycycline (VIBRAMYCIN) IV  100 mg Intravenous Q12H  . famotidine  10 mg Oral Daily  . insulin aspart  0-5 Units Subcutaneous QHS  . insulin aspart  0-9 Units Subcutaneous TID WC  . insulin glargine  25 Units Subcutaneous Daily  . isosorbide mononitrate  60 mg Oral Daily  . labetalol  300 mg Oral BID  . lactulose  20 g Oral TID  . levothyroxine  75 mcg Oral QAC breakfast  . metoCLOPramide  5 mg Oral TID PC & HS  . paricalcitol  2 mcg Intravenous Q T,Th,Sa-HD  . predniSONE  5 mg Oral Q breakfast  . rOPINIRole  1 mg Oral QHS  . senna-docusate  2 tablet Oral Daily  . sorbitol  30 mL Oral BID   Continuous Infusions:  PRN Meds:.sodium chloride, sodium chloride, sodium chloride, acetaminophen, acetaminophen, albuterol, alum & mag hydroxide-simeth, feeding supplement (NEPRO CARB STEADY), heparin, heparin, HYDROcodone-acetaminophen, HYDROmorphone, lidocaine, lidocaine-prilocaine, ondansetron (ZOFRAN) IV, ondansetron, pentafluoroprop-tetrafluoroeth  Assessment/Plan: Patient Active Hospital Problem List: Constipation, acute (08/23/2011) Slowly improving. Continue with enemas as needed.  CKD (chronic kidney disease) (07/22/2011) Further treatment as per Renal  History of renal transplant (07/22/2011) Renal input appreciated  Ostructive sleep apnea (07/22/2011) CPAP at night  CHF (congestive heart failure) (07/22/2011)  Anemia (07/22/2011) STABLE AT BASELINE  Diabetes Mellitus (07/22/2011) lantus and SSI CBG (last 3)   Basename 08/25/11 1615 08/25/11 1211 08/25/11 0618    GLUCAP 189* 100* 113*   Dyspnea: cxr shows possible left sided opacity, started the patient on rocephin and doxycycline.   Disposition: d/c in am after constipation is relieved.     LOS: 3 days   Ariza Evans 08/25/2011, 6:28 PM

## 2011-08-25 NOTE — Progress Notes (Signed)
Orthopedic Tech Progress Note Patient Details:  Dale [name] 03/16/1966 [id number]  Patient ID: Gillian Scarce, male   DOB: 1966-04-11, 46 y.o.   MRN: [id number] Pt refused una boot dressing; rn notified  Hildred Priest 08/25/2011, 5:43 PM

## 2011-08-25 NOTE — Progress Notes (Signed)
Orthopedic Tech Progress Note Patient Details:  Dale [name] 1965/08/19 [id number]  Patient ID: Dale [name], male   DOB: 1965-11-03, 46 y.o.   MRN: [id number] Pt refused,rn notified  Hildred Priest 08/25/2011, 5:40 PM

## 2011-08-25 NOTE — Progress Notes (Signed)
Pt declines soap suds enema at this time. Pt states that he has been having loose bowel movements most of today already, and would like to have the enema done tomorrow after her returns from hemodialysis.

## 2011-08-25 NOTE — Progress Notes (Signed)
Patient ID: Dale [name], male   DOB: 1965/07/27, 46 y.o.   MRN: [id number]   [facility] Progress Note    Subjective:   Reports ongoing loose scant BMs- "but I still feel stopped up"- postponed enema to today after HD   Objective:   BP 117/64  Pulse 88  Temp(Src) 97.3 F (36.3 C) (Oral)  Resp 16  Ht 5\' 8"  (1.727 m)  Wt 117.3 kg (258 lb 9.6 oz)  BMI 39.32 kg/m2  SpO2 100%  Physical Exam: [id number] on HD [id number] RRR, normal S1 and S2  Resp:CTA bilaterally, no rales/rhonchi [id number], obese, NT, BS normal Ext: 1+ thigh and 1-2+ UE edema  Labs: BMET  Lab 08/24/11 0500 08/23/11 0228 08/22/11 0746  NA 130* 134* 134*  K 4.3 4.4 4.0  CL 97 99 98  CO2 27 -- 30  GLUCOSE 269* 124* 98  BUN 12 21 19   CREATININE 2.19* 2.10* 2.15*  ALB -- -- --  CALCIUM 8.5 -- 9.4  PHOS -- -- --   CBC  Lab 08/25/11 0730 08/24/11 0500 08/23/11 0228 08/23/11 0151 08/22/11 0746  WBC 7.1 6.9 -- 8.2 8.1  NEUTROABS -- -- -- -- 4.6  HGB 8.2* 9.2* 10.5* 10.1* --  HCT 28.1* 30.9* 31.0* 33.6* --  MCV 92.4 91.7 -- 91.1 91.3  PLT 145* 170 -- 200 238    Medications:      . aspirin EC  81 mg Oral Daily  . bisacodyl  10 mg Rectal Daily  . calcium acetate  1,334 mg Oral TID WC  . cefTRIAXone (ROCEPHIN)  IV  1 g Intravenous Q24H  . cycloSPORINE modified  100 mg Oral BID  . darbepoetin (ARANESP) injection - DIALYSIS  200 mcg Intravenous Q Tue-HD  . doxycycline (VIBRAMYCIN) IV  100 mg Intravenous Q12H  . famotidine  10 mg Oral Daily  . insulin aspart  0-5 Units Subcutaneous QHS  . insulin aspart  0-9 Units Subcutaneous TID WC  . insulin glargine  25 Units Subcutaneous Daily  . isosorbide mononitrate  60 mg Oral Daily  . labetalol  300 mg Oral BID  . lactulose  20 g Oral TID  . levothyroxine  75 mcg Oral QAC breakfast  . metoCLOPramide  5 mg Oral TID PC & HS  . paricalcitol  2 mcg Intravenous Q T,Th,Sa-HD  . predniSONE  5 mg Oral Q breakfast  . rOPINIRole  1 mg Oral QHS  .  senna-docusate  2 tablet Oral Daily  . sorbitol  30 mL Oral BID     Assessment/ Plan:   1. Constipation/abdominal pain and discomfort unresponsive to outpatient therapy: Started on sorbitol by the hospitalist service and appears to be responsive. Enema today after HD....anticipated DC when symptoms alleviated 2. End-stage renal disease: We'll continue hemodialysis on a Tuesday, Thursday and Saturday schedule with dialysis tomorrow. Aim to get to estimated dry weight of 115 kg.  3. Anemia of chronic disease: Resume Epogen/Aranesp while here at the hospital. Iron panel will be rechecked.  4. Metabolic bone disease: Continue Zemplar, calcium acetate and monitore calcium/phosphorus levels.  5. Hypertension: On labetalol and Imdur that he was taking as an outpatient.  6. Insulin-dependent diabetes mellitus: Patient desires not to use his insulin pump in the hospital and is being controlled with basal/sliding scale insulin therapy    Elmarie Shiley, MD 08/25/2011, 9:07 AM

## 2011-08-26 DIAGNOSIS — E1165 Type 2 diabetes mellitus with hyperglycemia: Secondary | ICD-10-CM

## 2011-08-26 DIAGNOSIS — K5900 Constipation, unspecified: Secondary | ICD-10-CM

## 2011-08-26 DIAGNOSIS — E118 Type 2 diabetes mellitus with unspecified complications: Secondary | ICD-10-CM

## 2011-08-26 DIAGNOSIS — IMO0002 Reserved for concepts with insufficient information to code with codable children: Secondary | ICD-10-CM

## 2011-08-26 DIAGNOSIS — N186 End stage renal disease: Secondary | ICD-10-CM

## 2011-08-26 LAB — GLUCOSE, CAPILLARY
Glucose-Capillary: 225 mg/dL — ABNORMAL HIGH (ref 70–99)
Glucose-Capillary: 208 mg/dL — ABNORMAL HIGH (ref 70–99)
Glucose-Capillary: 282 mg/dL — ABNORMAL HIGH (ref 70–99)

## 2011-08-26 MED ORDER — DOXYCYCLINE HYCLATE 100 MG PO TABS: 100.0000 mg | ORAL_TABLET | Freq: Two times a day (BID) | ORAL | Status: AC

## 2011-08-26 MED ORDER — ISOSORBIDE MONONITRATE ER 60 MG PO TB24: 60.0000 mg | ORAL_TABLET | Freq: Every day | ORAL | Status: DC

## 2011-08-26 MED ORDER — CYCLOSPORINE MODIFIED (NEORAL) 25 MG PO CAPS
75.0000 mg | ORAL_CAPSULE | Freq: Two times a day (BID) | ORAL | Status: DC
  Administered 2011-08-26: 75 mg via ORAL
  Filled 2011-08-26 (×2): qty 3

## 2011-08-26 NOTE — Discharge Summary (Signed)
DISCHARGE SUMMARY  Dale [name]  MR#: [id number]  DOB:1965-08-01  Date of Admission: 08/22/2011 Date of Discharge: 08/26/2011  Attending Physician:Jermiah Howton  Patient's CN:[id number], MD, MD  Consults:Treatment Team:  Clayborne Dana. Posey Pronto, MD  Discharge Diagnoses: Present on Admission:  .Constipation, acute .CKD (chronic kidney disease) .Obstructive sleep apnea .Anemia .Diabetes mellitus .Dyspnea .CHF (congestive heart failure)   Initial presentation: Patient is a 46 year old malewho was recently discharged from the hospital on 08/13/2011 after a prolonged course of hospitalization, has a history of end-stage renal disease on hemodialysis, history of renal transplant, diabetes mellitus with insulin pump, obstructive sleep apnea presents to [name] ED with constipation.   Hospital Course: Constipation, acute (08/23/2011) Resolved . Initially abdominal lX ray showed moderate stool burden. started the patient on soap suds and fleet enemas. He had multiple bowel movements. And he is being discharged on stool softners.  CKD (chronic kidney disease) (07/22/2011) Further treatment as per Renal  History of renal transplant (07/22/2011) Further treatment as per renal as outpatient. Decreased cyclosporin to 75mg  BID.  Ostructive sleep apnea (07/22/2011) CPAP at night.  Anemia (07/22/2011) STABLE AT BASELINE . Outpatient follow up.  Resume Epogen and aranesp as per renal.  Diabetes Mellitus (07/22/2011): cbg's well controlled. Continue withLantus and SSI.  Community Acquired Pneumonia: started the patient on rocephin and doxycycline. His SOB has improved and he is being discharged on doxycycline to complete the course for pneumonia.    Medication List  As of 08/26/2011 11:26 AM   STOP taking these medications         Hydrocodone-Acetaminophen 5-300 MG Tabs         TAKE these medications         aspirin 81 MG tablet   Take 81 mg by mouth daily.      bisacodyl 10 MG  suppository   Commonly known as: DULCOLAX   Place 10 mg rectally daily as needed. For constipation      calcium acetate 667 MG capsule   Commonly known as: PHOSLO   Take 2 capsules (1,334 mg total) by mouth 3 (three) times daily with meals.      cycloSPORINE modified 100 MG capsule   Commonly known as: NEORAL/GENGRAF   Take 100 mg by mouth 2 (two) times daily. Neoral brand      doxycycline 100 MG tablet   Commonly known as: VIBRA-TABS   Take 1 tablet (100 mg total) by mouth 2 (two) times daily.      insulin pump 100 unit/ml Soln   Inject 1 each into the skin continuous. Basal rate is 1.2-1.9 units. Varies with time of day/per patient's device.      isosorbide mononitrate 60 MG 24 hr tablet   Commonly known as: IMDUR   Take 60 mg by mouth Daily.      labetalol 300 MG tablet   Commonly known as: NORMODYNE   Take 300 mg by mouth Twice daily.      lactulose 20 G packet   Commonly known as: CEPHULAC   Take 20 g by mouth 3 (three) times daily.      levothyroxine 75 MCG tablet   Commonly known as: SYNTHROID, LEVOTHROID   Take 75 mg by mouth Daily.      metoCLOPramide 5 MG tablet   Commonly known as: REGLAN   Take 5 mg by mouth 4 (four) times daily - after meals and at bedtime.      paricalcitol 5 MCG/ML injection   Commonly known as: ZEMPLAR  Inject 0.4 mLs (2 mcg total) into the vein Every Tuesday,Thursday,and Saturday with dialysis.      predniSONE 5 MG tablet   Commonly known as: DELTASONE   Take 5 mg by mouth Daily.      PROCRIT IJ   Inject 1 each as directed once a week. Takes on Tuesday in dialysis-please refer to HD records for dose      promethazine 25 MG tablet   Commonly known as: PHENERGAN   Take 25 mg by mouth Every 6 hours as needed. For nausea      PROVENTIL HFA 108 (90 BASE) MCG/ACT inhaler   Generic drug: albuterol   Inhale 1 puff into the lungs Every 6 hours as needed. For shortness of breath      ranitidine 75 MG tablet   Commonly known as:  ZANTAC   Take 75 mg by mouth daily.      rOPINIRole 1 MG tablet   Commonly known as: REQUIP   Take 1 mg by mouth Every evening. Take at 2000             Day of Discharge BP 145/70  Pulse 86  Temp(Src) 98.3 F (36.8 C) (Oral)  Resp 18  Ht 5\' 8"  (1.727 m)  Wt 118.117 kg (260 lb 6.4 oz)  BMI 39.59 kg/m2  SpO2 100%  Physical Exam: General: Alert, awake, oriented x3, appears comfortable noacute distress.  Heart: Regular rate and rhythm, without murmurs, rubs or gallops.  Lungs: Clear to auscultation bilaterally, no wheezing, rales or rhonchi.  Abdomen: Soft, but distended, minimal tenderness, obese, hypoactive bowel sounds  Extremities: No clubbing, cyanosis or edema with positive pedal pulses, left BKA.   Results for orders placed during the hospital encounter of 08/22/11 (from the past 24 hour(s))  GLUCOSE, CAPILLARY     Status: Abnormal   Collection Time   08/25/11 12:11 PM      Component Value Range   Glucose-Capillary 100 (*) 70 - 99 (mg/dL)  GLUCOSE, CAPILLARY     Status: Abnormal   Collection Time   08/25/11  4:15 PM      Component Value Range   Glucose-Capillary 189 (*) 70 - 99 (mg/dL)   Comment 1 Notify RN    GLUCOSE, CAPILLARY     Status: Abnormal   Collection Time   08/25/11  8:48 PM      Component Value Range   Glucose-Capillary 225 (*) 70 - 99 (mg/dL)   Comment 1 Notify RN     Comment 2 Documented in Chart    GLUCOSE, CAPILLARY     Status: Abnormal   Collection Time   08/26/11  5:47 AM      Component Value Range   Glucose-Capillary 208 (*) 70 - 99 (mg/dL)   Comment 1 Notify RN     Comment 2 Documented in Chart      Disposition: Home   Follow-up Appts: Discharge Orders    Future Orders Please Complete By Expires   Diet - low sodium heart healthy      Discharge instructions      Comments:   FOLLOW UP WITH pcp  AS NEEDED.   Activity as tolerated - No restrictions           Time spent in discharge (includes decision making & examination of  pt): 45 minutes  Signed: Anali Cabanilla 08/26/2011, 11:26 AM

## 2011-08-26 NOTE — Care Management Note (Signed)
    Page 1 of 1   08/26/2011     4:47:46 PM   CARE MANAGEMENT NOTE 08/26/2011  Patient:  Dale [name], Dale [name]   Account Number:  [id number]  Date Initiated:  08/23/2011  Documentation initiated by:  Kellie Moor  Subjective/Objective Assessment:   admitted on 08/22/11 with c/o constipation and CKD     Action/Plan:   prior to admission, patient lived at home with spouse.   Anticipated DC Date:  08/26/2011   Anticipated DC Plan:  [place]  CM consult      Choice offered to / List presented to:             Status of service:  Completed, signed off Medicare Important Message given?   (If response is "NO", the following Medicare IM given date fields will be blank) Date Medicare IM given:   Date Additional Medicare IM given:    Discharge Disposition:  HOME/SELF CARE  Per UR Regulation:  Reviewed for med. necessity/level of care/duration of stay  If discussed at [place] of Stay Meetings, dates discussed:    Comments:  PCP: Glenard HaringJuanda Bond (spouse) [phone]

## 2011-08-26 NOTE — Progress Notes (Signed)
Patient ID: Dale [name], male   DOB: May 23, 1965, 46 y.o.   MRN: [id number]   [facility] Progress Note    Subjective:   Feels well and wants to go home today- good BMs s/p enema yesterday   Objective:   BP 120/54  Pulse 81  Temp(Src) 98.3 F (36.8 C) (Oral)  Resp 18  Ht 5\' 8"  (1.727 m)  Wt 118.117 kg (260 lb 6.4 oz)  BMI 39.59 kg/m2  SpO2 100%  Physical Exam: [id number] sleeping in bed [id number] RRR, normal S1 and S2  Resp:CTA bilaterally, no rales/rhonchi [id number], obese, NT, BS normal Ext:Rt Leg 1-2+ edema, Left leg in prosthesis  Labs: BMET  Lab 08/25/11 0730 08/24/11 0500 08/23/11 0228 08/22/11 0746  NA 135 130* 134* 134*  K 3.9 4.3 4.4 4.0  CL 100 97 99 98  CO2 26 27 -- 30  GLUCOSE 105* 269* 124* 98  BUN 17 12 21 19   CREATININE 3.13* 2.19* 2.10* 2.15*  ALB -- -- -- --  CALCIUM 8.5 8.5 -- 9.4  PHOS -- -- -- --   CBC  Lab 08/25/11 0730 08/24/11 0500 08/23/11 0228 08/23/11 0151 08/22/11 0746  WBC 7.1 6.9 -- 8.2 8.1  NEUTROABS -- -- -- -- 4.6  HGB 8.2* 9.2* 10.5* 10.1* --  HCT 28.1* 30.9* 31.0* 33.6* --  MCV 92.4 91.7 -- 91.1 91.3  PLT 145* 170 -- 200 238    @IMGRELPRIORS @ Medications:      . aspirin EC  81 mg Oral Daily  . bisacodyl  10 mg Rectal Daily  . calcium acetate  1,334 mg Oral TID WC  . cefTRIAXone (ROCEPHIN)  IV  1 g Intravenous Q24H  . cycloSPORINE modified  100 mg Oral BID  . darbepoetin (ARANESP) injection - DIALYSIS  200 mcg Intravenous Q Tue-HD  . doxycycline (VIBRAMYCIN) IV  100 mg Intravenous Q12H  . famotidine  10 mg Oral Daily  . insulin aspart  0-5 Units Subcutaneous QHS  . insulin aspart  0-9 Units Subcutaneous TID WC  . insulin glargine  25 Units Subcutaneous Daily  . isosorbide mononitrate  60 mg Oral Daily  . labetalol  300 mg Oral BID  . lactulose  20 g Oral TID  . levothyroxine  75 mcg Oral QAC breakfast  . metoCLOPramide  5 mg Oral TID PC & HS  . paricalcitol  2 mcg Intravenous Q T,Th,Sa-HD  .  predniSONE  5 mg Oral Q breakfast  . rOPINIRole  1 mg Oral QHS  . senna-docusate  2 tablet Oral Daily  . sorbitol  30 mL Oral BID     Assessment/ Plan:   1. Constipation/abdominal pain and discomfort unresponsive to outpatient therapy: symptoms alleviated by PO sorbitol and enema. Would DC on 5 days of sorbitol/ chronic docusate  2. End-stage renal disease: We'll continue hemodialysis on a Tuesday, Thursday and Saturday schedule with dialysis tomorrow at OP HD unit. Reduce cyclosporin to 75mg  PO BID- Rx given 3. Anemia of chronic disease: Resume Epogen/Aranesp while here at the hospital. Iron panel will be rechecked.  4. Metabolic bone disease: Continue Zemplar, calcium acetate and monitore calcium/phosphorus levels.  5. Hypertension: On labetalol and Imdur that he was taking as an outpatient.  6. Insulin-dependent diabetes mellitus: Patient desires not to use his insulin pump in the hospital and is being controlled with basal/sliding scale insulin therapy    Elmarie Shiley, MD 08/26/2011, 9:13 AM

## 2011-08-26 NOTE — Progress Notes (Signed)
Noted patient for d/c to home today.  Plan is for patient to resume his insulin pump at d/c.  Patient received 25 units Lantus SQ this morning at ~10am.  Called patient on phone and spoke with him about resuming his insulin pump.  Advised patient to resume insulin pump but to suspend basal rates until tomorrow AM at breakfast.  Patient can use pump to bolus self for meals but really needs to wait until tomorrow morning to resume basal rates so that the Lantus and his insulin pump basal rates do not overlap and cause hypoglycemia.  Patient aware and agrees to follow this plan.   Will follow. Wyn Quaker RN, MSN, CDE Diabetes Coordinator Inpatient Diabetes Program [phone]

## 2011-08-26 NOTE — Progress Notes (Signed)
IV d/c'd.  Tele d/c'd.  Pt d/c'd to home.  Home meds and d/c instructions have been discussed with pt.  Pt denies any questions or concerns at this time.  Pt leaving unit via wheelchair and appears in no acute distress. Casandra Dallaire RN 

## 2011-08-27 ENCOUNTER — Inpatient Hospital Stay (HOSPITAL_COMMUNITY): Payer: Medicare Other

## 2011-08-27 ENCOUNTER — Emergency Department (HOSPITAL_COMMUNITY): Payer: Medicare Other

## 2011-08-27 ENCOUNTER — Encounter (HOSPITAL_COMMUNITY): Payer: Self-pay | Admitting: Emergency Medicine

## 2011-08-27 ENCOUNTER — Other Ambulatory Visit: Payer: Self-pay

## 2011-08-27 ENCOUNTER — Inpatient Hospital Stay (HOSPITAL_COMMUNITY)
Admission: EM | Admit: 2011-08-27 | Discharge: 2011-09-06 | DRG: 393 | Disposition: A | Discharge status: Home Health | Payer: Medicare Other | Attending: Internal Medicine | Admitting: Internal Medicine

## 2011-08-27 DIAGNOSIS — D649 Anemia, unspecified: Secondary | ICD-10-CM | POA: Diagnosis present

## 2011-08-27 DIAGNOSIS — E039 Hypothyroidism, unspecified: Secondary | ICD-10-CM | POA: Diagnosis present

## 2011-08-27 DIAGNOSIS — Z6839 Body mass index (BMI) 39.0-39.9, adult: Secondary | ICD-10-CM

## 2011-08-27 DIAGNOSIS — I313 Pericardial effusion (noninflammatory): Secondary | ICD-10-CM

## 2011-08-27 DIAGNOSIS — K648 Other hemorrhoids: Secondary | ICD-10-CM | POA: Diagnosis present

## 2011-08-27 DIAGNOSIS — E1121 Type 2 diabetes mellitus with diabetic nephropathy: Secondary | ICD-10-CM | POA: Diagnosis present

## 2011-08-27 DIAGNOSIS — K819 Cholecystitis, unspecified: Secondary | ICD-10-CM

## 2011-08-27 DIAGNOSIS — I319 Disease of pericardium, unspecified: Secondary | ICD-10-CM

## 2011-08-27 DIAGNOSIS — D638 Anemia in other chronic diseases classified elsewhere: Secondary | ICD-10-CM | POA: Diagnosis present

## 2011-08-27 DIAGNOSIS — Z89519 Acquired absence of unspecified leg below knee: Secondary | ICD-10-CM

## 2011-08-27 DIAGNOSIS — N039 Chronic nephritic syndrome with unspecified morphologic changes: Secondary | ICD-10-CM

## 2011-08-27 DIAGNOSIS — Z940 Kidney transplant status: Secondary | ICD-10-CM

## 2011-08-27 DIAGNOSIS — D631 Anemia in chronic kidney disease: Secondary | ICD-10-CM

## 2011-08-27 DIAGNOSIS — K810 Acute cholecystitis: Secondary | ICD-10-CM

## 2011-08-27 DIAGNOSIS — K3184 Gastroparesis: Secondary | ICD-10-CM | POA: Diagnosis present

## 2011-08-27 DIAGNOSIS — I120 Hypertensive chronic kidney disease with stage 5 chronic kidney disease or end stage renal disease: Secondary | ICD-10-CM | POA: Diagnosis present

## 2011-08-27 DIAGNOSIS — R109 Unspecified abdominal pain: Secondary | ICD-10-CM | POA: Diagnosis present

## 2011-08-27 DIAGNOSIS — K551 Chronic vascular disorders of intestine: Principal | ICD-10-CM | POA: Diagnosis present

## 2011-08-27 DIAGNOSIS — T8610 Unspecified complication of kidney transplant: Secondary | ICD-10-CM

## 2011-08-27 DIAGNOSIS — I3139 Other pericardial effusion (noninflammatory): Secondary | ICD-10-CM

## 2011-08-27 DIAGNOSIS — N2581 Secondary hyperparathyroidism of renal origin: Secondary | ICD-10-CM | POA: Diagnosis present

## 2011-08-27 DIAGNOSIS — I509 Heart failure, unspecified: Secondary | ICD-10-CM

## 2011-08-27 DIAGNOSIS — L97809 Non-pressure chronic ulcer of other part of unspecified lower leg with unspecified severity: Secondary | ICD-10-CM | POA: Diagnosis present

## 2011-08-27 DIAGNOSIS — Z992 Dependence on renal dialysis: Secondary | ICD-10-CM

## 2011-08-27 DIAGNOSIS — R0600 Dyspnea, unspecified: Secondary | ICD-10-CM

## 2011-08-27 DIAGNOSIS — I5033 Acute on chronic diastolic (congestive) heart failure: Secondary | ICD-10-CM | POA: Diagnosis present

## 2011-08-27 DIAGNOSIS — K5730 Diverticulosis of large intestine without perforation or abscess without bleeding: Secondary | ICD-10-CM | POA: Diagnosis present

## 2011-08-27 DIAGNOSIS — S88119A Complete traumatic amputation at level between knee and ankle, unspecified lower leg, initial encounter: Secondary | ICD-10-CM

## 2011-08-27 DIAGNOSIS — K559 Vascular disorder of intestine, unspecified: Secondary | ICD-10-CM

## 2011-08-27 DIAGNOSIS — G4733 Obstructive sleep apnea (adult) (pediatric): Secondary | ICD-10-CM | POA: Diagnosis present

## 2011-08-27 DIAGNOSIS — N186 End stage renal disease: Secondary | ICD-10-CM

## 2011-08-27 DIAGNOSIS — K5900 Constipation, unspecified: Secondary | ICD-10-CM | POA: Diagnosis present

## 2011-08-27 DIAGNOSIS — N189 Chronic kidney disease, unspecified: Secondary | ICD-10-CM

## 2011-08-27 DIAGNOSIS — I318 Other specified diseases of pericardium: Secondary | ICD-10-CM

## 2011-08-27 DIAGNOSIS — E1149 Type 2 diabetes mellitus with other diabetic neurological complication: Secondary | ICD-10-CM | POA: Diagnosis present

## 2011-08-27 HISTORY — DX: Long term (current) use of insulin: Z79.4

## 2011-08-27 HISTORY — DX: Personal history of other medical treatment: Z92.89

## 2011-08-27 HISTORY — DX: End stage renal disease: N18.6

## 2011-08-27 HISTORY — DX: Other pericardial effusion (noninflammatory): I31.39

## 2011-08-27 HISTORY — DX: Cholecystitis, unspecified: K81.9

## 2011-08-27 HISTORY — DX: Type 2 diabetes mellitus without complications: E11.9

## 2011-08-27 HISTORY — DX: Reserved for inherently not codable concepts without codable children: IMO0001

## 2011-08-27 HISTORY — DX: Pericardial effusion (noninflammatory): I31.3

## 2011-08-27 HISTORY — DX: Localized enlarged lymph nodes: R59.0

## 2011-08-27 LAB — CBC
HCT: 30.4 % — ABNORMAL LOW (ref 39.0–52.0)
Hemoglobin: 9 g/dL — ABNORMAL LOW (ref 13.0–17.0)
MCH: 27.2 pg (ref 26.0–34.0)
MCHC: 29.6 g/dL — ABNORMAL LOW (ref 30.0–36.0)
MCV: 91.8 fL (ref 78.0–100.0)
Platelets: 160 10*3/uL (ref 150–400)
RBC: 3.31 MIL/uL — ABNORMAL LOW (ref 4.22–5.81)
RDW: 19.8 % — ABNORMAL HIGH (ref 11.5–15.5)
WBC: 6.8 10*3/uL (ref 4.0–10.5)

## 2011-08-27 LAB — COMPREHENSIVE METABOLIC PANEL
ALT: 9 U/L (ref 0–53)
AST: 13 U/L (ref 0–37)
Albumin: 1.9 g/dL — ABNORMAL LOW (ref 3.5–5.2)
Alkaline Phosphatase: 197 U/L — ABNORMAL HIGH (ref 39–117)
BUN: 17 mg/dL (ref 6–23)
CO2: 30 mEq/L (ref 19–32)
Calcium: 9 mg/dL (ref 8.4–10.5)
Chloride: 97 mEq/L (ref 96–112)
Creatinine, Ser: 3.02 mg/dL — ABNORMAL HIGH (ref 0.50–1.35)
GFR calc Af Amer: 27 mL/min — ABNORMAL LOW (ref >90)
GFR calc non Af Amer: 23 mL/min — ABNORMAL LOW (ref >90)
Glucose, Bld: 151 mg/dL — ABNORMAL HIGH (ref 70–99)
Potassium: 3.2 mEq/L — ABNORMAL LOW (ref 3.5–5.1)
Sodium: 134 mEq/L — ABNORMAL LOW (ref 135–145)
Total Bilirubin: 0.5 mg/dL (ref 0.3–1.2)
Total Protein: 6.9 g/dL (ref 6.0–8.3)

## 2011-08-27 LAB — POCT I-STAT, CHEM 8
BUN: 17 mg/dL (ref 6–23)
Calcium, Ion: 1.22 mmol/L (ref 1.12–1.32)
Chloride: 97 mEq/L (ref 96–112)
Creatinine, Ser: 3 mg/dL — ABNORMAL HIGH (ref 0.50–1.35)
Glucose, Bld: 159 mg/dL — ABNORMAL HIGH (ref 70–99)
HCT: 30 % — ABNORMAL LOW (ref 39.0–52.0)
Hemoglobin: 10.2 g/dL — ABNORMAL LOW (ref 13.0–17.0)
Potassium: 3.4 mEq/L — ABNORMAL LOW (ref 3.5–5.1)
Sodium: 137 mEq/L (ref 135–145)
TCO2: 30 mmol/L (ref 0–100)

## 2011-08-27 LAB — GLUCOSE, CAPILLARY
Glucose-Capillary: 141 mg/dL — ABNORMAL HIGH (ref 70–99)
Glucose-Capillary: 157 mg/dL — ABNORMAL HIGH (ref 70–99)

## 2011-08-27 LAB — LACTIC ACID, PLASMA: Lactic Acid, Venous: 1.4 mmol/L (ref 0.5–2.2)

## 2011-08-27 LAB — LIPASE, BLOOD: Lipase: 26 U/L (ref 11–59)

## 2011-08-27 LAB — DIFFERENTIAL
Basophils Absolute: 0.1 10*3/uL (ref 0.0–0.1)
Basophils Relative: 1 % (ref 0–1)
Eosinophils Absolute: 0.7 10*3/uL (ref 0.0–0.7)
Eosinophils Relative: 10 % — ABNORMAL HIGH (ref 0–5)
Lymphocytes Relative: 24 % (ref 12–46)
Lymphs Abs: 1.6 10*3/uL (ref 0.7–4.0)
Monocytes Absolute: 0.7 10*3/uL (ref 0.1–1.0)
Monocytes Relative: 11 % (ref 3–12)
Neutro Abs: 3.7 10*3/uL (ref 1.7–7.7)
Neutrophils Relative %: 54 % (ref 43–77)

## 2011-08-27 MED ORDER — SODIUM CHLORIDE 0.9 % IV SOLN: 100.0000 mL | INTRAVENOUS | Status: DC | PRN

## 2011-08-27 MED ORDER — HYDROMORPHONE HCL PF 1 MG/ML IJ SOLN
1.0000 mg | Freq: Once | INTRAMUSCULAR | Status: AC
  Administered 2011-08-27: 1 mg via INTRAVENOUS
  Filled 2011-08-27: qty 1

## 2011-08-27 MED ORDER — LIDOCAINE-PRILOCAINE 2.5-2.5 % EX CREA: 1.0000 "application " | TOPICAL_CREAM | CUTANEOUS | Status: DC | PRN

## 2011-08-27 MED ORDER — NEPRO/CARBSTEADY PO LIQD
237.0000 mL | ORAL | Status: DC | PRN
  Filled 2011-08-27: qty 237

## 2011-08-27 MED ORDER — LIDOCAINE HCL (PF) 1 % IJ SOLN: 5.0000 mL | INTRAMUSCULAR | Status: DC | PRN

## 2011-08-27 MED ORDER — INSULIN ASPART 100 UNIT/ML ~~LOC~~ SOLN
0.0000 [IU] | Freq: Every day | SUBCUTANEOUS | Status: DC
Start: 2011-08-27 — End: 2011-09-05
  Administered 2011-08-29 – 2011-09-01 (×2): 2 [IU] via SUBCUTANEOUS

## 2011-08-27 MED ORDER — LORAZEPAM 1 MG PO TABS
2.0000 mg | ORAL_TABLET | Freq: Once | ORAL | Status: AC
  Administered 2011-08-27: 2 mg via ORAL
  Filled 2011-08-27: qty 2

## 2011-08-27 MED ORDER — INSULIN ASPART 100 UNIT/ML ~~LOC~~ SOLN
0.0000 [IU] | Freq: Three times a day (TID) | SUBCUTANEOUS | Status: DC
  Administered 2011-08-28: 9 [IU] via SUBCUTANEOUS
  Administered 2011-08-28 (×2): 2 [IU] via SUBCUTANEOUS
  Administered 2011-08-29: 9 [IU] via SUBCUTANEOUS
  Administered 2011-08-29: 4 [IU] via SUBCUTANEOUS
  Administered 2011-08-30 – 2011-08-31 (×2): 1 [IU] via SUBCUTANEOUS
  Administered 2011-08-31 – 2011-09-02 (×4): 2 [IU] via SUBCUTANEOUS
  Administered 2011-09-03: 3 [IU] via SUBCUTANEOUS
  Administered 2011-09-03 – 2011-09-05 (×3): 2 [IU] via SUBCUTANEOUS
  Administered 2011-09-05 (×2): 5 [IU] via SUBCUTANEOUS

## 2011-08-27 MED ORDER — HEPARIN SODIUM (PORCINE) 1000 UNIT/ML DIALYSIS
1000.0000 [IU] | INTRAMUSCULAR | Status: DC | PRN
  Filled 2011-08-27: qty 1

## 2011-08-27 MED ORDER — ALBUTEROL SULFATE (5 MG/ML) 0.5% IN NEBU: 2.5000 mg | INHALATION_SOLUTION | Freq: Four times a day (QID) | RESPIRATORY_TRACT | Status: DC | PRN

## 2011-08-27 MED ORDER — HYDROMORPHONE HCL PF 1 MG/ML IJ SOLN
1.0000 mg | INTRAMUSCULAR | Status: DC | PRN
  Administered 2011-08-27 – 2011-09-06 (×56): 1 mg via INTRAVENOUS
  Administered 2011-09-06: 10:00:00 via INTRAVENOUS
  Administered 2011-09-06 (×2): 1 mg via INTRAVENOUS
  Filled 2011-08-27 (×57): qty 1

## 2011-08-27 MED ORDER — PENTAFLUOROPROP-TETRAFLUOROETH EX AERO
1.0000 "application " | INHALATION_SPRAY | CUTANEOUS | Status: DC | PRN
  Filled 2011-08-27: qty 103.5

## 2011-08-27 MED ORDER — ONDANSETRON HCL 4 MG/2ML IJ SOLN
4.0000 mg | Freq: Once | INTRAMUSCULAR | Status: AC
  Administered 2011-08-27: 4 mg via INTRAVENOUS
  Filled 2011-08-27: qty 2

## 2011-08-27 MED ORDER — HYDROMORPHONE HCL PF 1 MG/ML IJ SOLN
INTRAMUSCULAR | Status: AC
  Administered 2011-08-27: 1 mg via INTRAVENOUS
  Filled 2011-08-27: qty 1

## 2011-08-27 MED ORDER — ALTEPLASE 2 MG IJ SOLR
2.0000 mg | Freq: Once | INTRAMUSCULAR | Status: AC | PRN
  Filled 2011-08-27: qty 2

## 2011-08-27 MED ORDER — HYDROCORTISONE SOD SUCCINATE 100 MG IJ SOLR
50.0000 mg | Freq: Three times a day (TID) | INTRAMUSCULAR | Status: DC
  Administered 2011-08-28 (×2): via INTRAVENOUS
  Filled 2011-08-27 (×6): qty 1

## 2011-08-27 MED ORDER — PIPERACILLIN-TAZOBACTAM IN DEX 2-0.25 GM/50ML IV SOLN
2.2500 g | Freq: Three times a day (TID) | INTRAVENOUS | Status: DC
  Administered 2011-08-28 – 2011-08-29 (×5): 2.25 g via INTRAVENOUS
  Filled 2011-08-27 (×8): qty 50

## 2011-08-27 MED ORDER — ONDANSETRON HCL 4 MG/2ML IJ SOLN
4.0000 mg | Freq: Four times a day (QID) | INTRAMUSCULAR | Status: DC | PRN
  Administered 2011-08-28 – 2011-09-05 (×18): 4 mg via INTRAVENOUS
  Filled 2011-08-27 (×18): qty 2

## 2011-08-27 MED ORDER — IOHEXOL 300 MG/ML  SOLN
20.0000 mL | INTRAMUSCULAR | Status: DC
  Administered 2011-08-27: 20 mL via ORAL

## 2011-08-27 MED ORDER — PIPERACILLIN-TAZOBACTAM IN DEX 2-0.25 GM/50ML IV SOLN
2.2500 g | Freq: Once | INTRAVENOUS | Status: AC
  Administered 2011-08-27: 2.25 g via INTRAVENOUS
  Filled 2011-08-27: qty 50

## 2011-08-27 MED ORDER — ONDANSETRON HCL 4 MG PO TABS: 4.0000 mg | ORAL_TABLET | Freq: Four times a day (QID) | ORAL | Status: DC | PRN

## 2011-08-27 MED ORDER — LABETALOL HCL 5 MG/ML IV SOLN
10.0000 mg | Freq: Two times a day (BID) | INTRAVENOUS | Status: DC | PRN
  Filled 2011-08-27: qty 4

## 2011-08-27 NOTE — ED Notes (Signed)
Pt due for dialysis today, goes to [facility]

## 2011-08-27 NOTE — Progress Notes (Signed)
ANTIBIOTIC CONSULT NOTE - INITIAL  Pharmacy Consult for Zosyn Indication: cholecystitis  Allergies  Allergen Reactions  . Amoxicillin Other (See Comments)    Reaction unknown  . Ciprofloxacin Other (See Comments)    Reaction unknown  . Clindamycin/Lincomycin Other (See Comments)    Reaction unknown  . Codeine Other (See Comments)    Reaction unknown  . Levofloxacin Other (See Comments)    Reaction unknown  . Morphine And Related     nausea  . Vasotec Cough     Vital Signs: Temp: 98.3 F (36.8 C) (05/04 1530) Temp src: Oral (05/04 1530) BP: 102/55 mmHg (05/04 1530) Pulse Rate: 81  (05/04 1530) Intake/Output from previous day:   Intake/Output from this shift:    Labs:  Basename 08/27/11 0840 08/27/11 0820 08/25/11 0730  WBC -- 6.8 7.1  HGB 10.2* 9.0* 8.2*  PLT -- 160 145*  LABCREA -- -- --  CREATININE 3.00* 3.02* 3.13*   The CrCl is unknown because both a height and weight (above a minimum accepted value) are required for this calculation. No results found for this basename: VANCOTROUGH:2,VANCOPEAK:2,VANCORANDOM:2,GENTTROUGH:2,GENTPEAK:2,GENTRANDOM:2,TOBRATROUGH:2,TOBRAPEAK:2,TOBRARND:2,AMIKACINPEAK:2,AMIKACINTROU:2,AMIKACIN:2, in the last 72 hours   Microbiology: Recent Results (from the past 720 hour(s))  CULTURE, BLOOD (ROUTINE X 2)     Status: Normal   Collection Time   08/02/11  3:52 PM      Component Value Range Status Comment   Specimen Description BLOOD HEMODIALYSIS CATHETER   Final    Special Requests     Final    Value: BOTTLES DRAWN AEROBIC AND ANAEROBIC 10CC RIGHT IJ CATH   Culture  Setup Time [id number]   Final    Culture NO GROWTH 5 DAYS   Final    Report Status 08/09/2011 FINAL   Final   CULTURE, BLOOD (ROUTINE X 2)     Status: Normal   Collection Time   08/02/11  5:40 PM      Component Value Range Status Comment   Specimen Description BLOOD HEMODIALYSIS CATHETER   Final    Special Requests BOTTLES DRAWN AEROBIC AND ANAEROBIC 10CC RIGHT IJ    Final    Culture  Setup Time [id number]   Final    Culture NO GROWTH 5 DAYS   Final    Report Status 08/09/2011 FINAL   Final   URINE CULTURE     Status: Normal   Collection Time   08/04/11  4:11 PM      Component Value Range Status Comment   Specimen Description URINE, RANDOM   Final    Special Requests NONE   Final    Culture  Setup Time [id number]   Final    Colony Count >=100,000 COLONIES/ML   Final    Culture YEAST   Final    Report Status 08/06/2011 FINAL   Final   AFB CULTURE WITH SMEAR     Status: Normal (Preliminary result)   Collection Time   08/05/11  4:52 PM      Component Value Range Status Comment   Specimen Description TISSUE LYMPH NODE   Final    Special Requests NONE   Final    ACID FAST SMEAR NO ACID FAST BACILLI SEEN   Final    Culture     Final    Value: CULTURE WILL BE EXAMINED FOR 6 WEEKS BEFORE ISSUING A FINAL REPORT   Report Status PENDING   Incomplete   FUNGUS CULTURE W SMEAR     Status: Normal (Preliminary result)   Collection Time  08/05/11  4:52 PM      Component Value Range Status Comment   Specimen Description TISSUE LYMPH NODE   Final    Special Requests NONE   Final    Fungal Smear NO YEAST OR FUNGAL ELEMENTS SEEN   Final    Culture CULTURE IN PROGRESS FOR FOUR WEEKS   Final    Report Status PENDING   Incomplete   TISSUE CULTURE     Status: Normal   Collection Time   08/05/11  4:52 PM      Component Value Range Status Comment   Specimen Description TISSUE LYMPH NODE   Final    Special Requests NONE   Final    Gram Stain     Final    Value: NO WBC SEEN     NO SQUAMOUS EPITHELIAL CELLS SEEN     NO ORGANISMS SEEN   Culture NO GROWTH 3 DAYS   Final    Report Status 08/09/2011 FINAL   Final   MRSA PCR SCREENING     Status: Normal   Collection Time   08/23/11  9:42 AM      Component Value Range Status Comment   MRSA by PCR NEGATIVE  NEGATIVE  Final     Medical History: Past Medical History  Diagnosis Date  . Insulin dependent diabetes  mellitus   . Hypertension   . History of blood transfusion   . ESRD (end stage renal disease)     a. s/p cadaveric transplant w/ susbequent failure after 9 yrs of allograft fxn;  b.T/Th/Sat Dialysis  . Dyspnea     a. 07/23/11 Echo: EF 55-60%  . Anemia of chronic disease   . Cholecystitis     a. 08/27/2011  . Constipation   . Pericardial effusion     a.  Small by CT 08/22/11;  b.  Large by CT 08/27/11  . Inguinal lymphadenopathy     a. bilateral - s/p biopsy 07/2011   Assessment: 46 year old with acute cholecystitis Continuing Zosyn treatment  Goal of Therapy:  Appropriate dosing  Plan:  1) Zosyn 2.25 grams iv Q 8 hours 2) Pharmacy will sign off.  Thank you.  Tad Moore 08/27/2011,3:40 PM

## 2011-08-27 NOTE — Progress Notes (Signed)
  Echocardiogram 2D Echocardiogram has been performed.  Dale [name], Dale [name] 08/27/2011, 4:22 PM

## 2011-08-27 NOTE — ED Notes (Signed)
Pt finished drinking oral contrast for CT. Remains on cardiac monitor. Vital signs stable. Wife at bedside. No signs of acute distress noted.

## 2011-08-27 NOTE — Progress Notes (Signed)
Patient and wife states he is to have a unna boot placed on his right leg where there is discoloration to his shin and two skin tears are present. Wound care consult placed and patient is currently in HD.

## 2011-08-27 NOTE — Consult Note (Signed)
Reason for Consult: acalculous cholecystitis Referring Physician: Dr Lorayne Marek Dale [name] is an 46 y.o. male.  HPI: 46 year old morbidly obese Caucasian male with a history of insulin-dependent diabetes mellitus, hypertension, end-stage renal disease currently on hemodialysis, anemia of chronic disease, congestive heart failure, obstructive sleep apnea, history of cadaveric renal transplant which has failed still on immunosuppression, retroperitoneal and inguinal lymphadenopathy comes back to the ER after being discharged from the hospital yesterday with abdominal pain and constipation and shortness of breath.  Essentially the patient has more or less been in the hospital since March 29. He was in the hospital from March 29 through April 20 and then subsequently readmitted from April 29 through May 3. His most recent admission at the end of April was for abdominal pain. He was constipated. He had a CT scan during that admission which demonstrated his known adenopathy, a 4 cm ventral hernia containing fat, and a normal gallbladder. He underwent several enemas. Upon discharge yesterday he developed some shortness of breath and went to the [facility] emergency room where labs and a chest x-ray were performed. He states he was given some Lasix and discharged home. Upon returning home he subsequently developed worsening shortness of breath and abdominal pain prompting him to come to [facility] emergency room. He complains of bilateral abdominal pain he denies any nausea or vomiting. He did have some dry heaves in the emergency room. He describes the pain as constant and sharp. His last bowel movement was 2 days ago when in the hospital when he was given a soapsuds enema. He has had a decreased appetite for several weeks. He states that he saw some weight over the past couple weeks. When he has had a bowel movement, he denies melena, hematochezia, or acholic stools. He denies NSAID use. He denies heartburn or  reflux.  His renal transplant recently stopped working and he was restarted on hemodialysis over the past couple weeks. He does have a history of peritoneal dialysis. During his hospitalization in early April he was found to have diffuse lymphadenopathy in his abdomen and groin area. He underwent a surgical excisional biopsy of a left groin lymph node. There is no evidence of lymphoma. It was felt to be most consistent with lymphoproliferative disease probably due to immunosuppression    Past Medical History  Diagnosis Date  . Insulin dependent diabetes mellitus   . Hypertension   . History of blood transfusion   . ESRD (end stage renal disease)     a. s/p cadaveric transplant w/ susbequent failure after 9 yrs of allograft fxn;  b.T/Th/Sat Dialysis  . Dyspnea     a. 07/23/11 Echo: EF 55-60%  . Anemia of chronic disease   . Cholecystitis     a. 08/27/2011  . Constipation   . Pericardial effusion     a.  Small by CT 08/22/11;  b.  Large by CT 08/27/11  . Inguinal lymphadenopathy     a. bilateral - s/p biopsy 07/2011    Past Surgical History  Procedure Date  . Appendectomy   . Hernia repair   . Kidney transplant   . Cataract extraction   . Arteriovenous graft placement   . Av fistula placement   . Toe amputation   . Below knee leg amputation   . Carpal tunnel release   . Shoulder surgery   . Av fistula placement 07/29/2011    Procedure: ARTERIOVENOUS (AV) FISTULA CREATION;  Surgeon: Mal Misty, MD;  Location: [place];  Service: Vascular;  Laterality: Right;  Brachial cephalic  . Insertion of dialysis catheter 08/01/2011    Procedure: INSERTION OF DIALYSIS CATHETER;  Surgeon: Rosetta Posner, MD;  Location: [place];  Service: Vascular;  Laterality: Right;  insertion of dialysis catheter on right internal jugular vein  . Lymph node biopsy 08/10/2011    Procedure: LYMPH NODE BIOPSY;  Surgeon: Merrie Roof, MD;  Location: [place];  Service: General;  Laterality: Left;  left groin lymph biopsy     History reviewed. No pertinent family history.  Social History:  reports that he has never smoked. He has never used smokeless tobacco. He reports that he drinks alcohol. He reports that he does not use illicit drugs.  Allergies:  Allergies  Allergen Reactions  . Amoxicillin Other (See Comments)    Reaction unknown  . Ciprofloxacin Other (See Comments)    Reaction unknown  . Clindamycin/Lincomycin Other (See Comments)    Reaction unknown  . Codeine Other (See Comments)    Reaction unknown  . Levofloxacin Other (See Comments)    Reaction unknown  . Morphine And Related     nausea  . Vasotec Cough    Medications: I have reviewed the patient's current medications.  Results for orders placed during the hospital encounter of 08/27/11 (from the past 48 hour(s))  COMPREHENSIVE METABOLIC PANEL     Status: Abnormal   Collection Time   08/27/11  8:20 AM      Component Value Range Comment   Sodium 134 (*) 135 - 145 (mEq/L)    Potassium 3.2 (*) 3.5 - 5.1 (mEq/L)    Chloride 97  96 - 112 (mEq/L)    CO2 30  19 - 32 (mEq/L)    Glucose, Bld 151 (*) 70 - 99 (mg/dL)    BUN 17  6 - 23 (mg/dL)    Creatinine, Ser 3.02 (*) 0.50 - 1.35 (mg/dL)    Calcium 9.0  8.4 - 10.5 (mg/dL)    Total Protein 6.9  6.0 - 8.3 (g/dL)    Albumin 1.9 (*) 3.5 - 5.2 (g/dL)    AST 13  0 - 37 (U/L)    ALT 9  0 - 53 (U/L)    Alkaline Phosphatase 197 (*) 39 - 117 (U/L)    Total Bilirubin 0.5  0.3 - 1.2 (mg/dL)    GFR calc non Af Amer 23 (*) >90 (mL/min)    GFR calc Af Amer 27 (*) >90 (mL/min)   LIPASE, BLOOD     Status: Normal   Collection Time   08/27/11  8:20 AM      Component Value Range Comment   Lipase 26  11 - 59 (U/L)   LACTIC ACID, PLASMA     Status: Normal   Collection Time   08/27/11  8:20 AM      Component Value Range Comment   Lactic Acid, Venous 1.4  0.5 - 2.2 (mmol/L)   CBC     Status: Abnormal   Collection Time   08/27/11  8:20 AM      Component Value Range Comment   WBC 6.8  4.0 - 10.5 (K/uL)     RBC 3.31 (*) 4.22 - 5.81 (MIL/uL)    Hemoglobin 9.0 (*) 13.0 - 17.0 (g/dL)    HCT 30.4 (*) 39.0 - 52.0 (%)    MCV 91.8  78.0 - 100.0 (fL)    MCH 27.2  26.0 - 34.0 (pg)    MCHC 29.6 (*) 30.0 - 36.0 (  g/dL)    RDW 19.8 (*) 11.5 - 15.5 (%)    Platelets 160  150 - 400 (K/uL)   DIFFERENTIAL     Status: Abnormal   Collection Time   08/27/11  8:20 AM      Component Value Range Comment   Neutrophils Relative 54  43 - 77 (%)    Neutro Abs 3.7  1.7 - 7.7 (K/uL)    Lymphocytes Relative 24  12 - 46 (%)    Lymphs Abs 1.6  0.7 - 4.0 (K/uL)    Monocytes Relative 11  3 - 12 (%)    Monocytes Absolute 0.7  0.1 - 1.0 (K/uL)    Eosinophils Relative 10 (*) 0 - 5 (%)    Eosinophils Absolute 0.7  0.0 - 0.7 (K/uL)    Basophils Relative 1  0 - 1 (%)    Basophils Absolute 0.1  0.0 - 0.1 (K/uL)   POCT I-STAT, CHEM 8     Status: Abnormal   Collection Time   08/27/11  8:40 AM      Component Value Range Comment   Sodium 137  135 - 145 (mEq/L)    Potassium 3.4 (*) 3.5 - 5.1 (mEq/L)    Chloride 97  96 - 112 (mEq/L)    BUN 17  6 - 23 (mg/dL)    Creatinine, Ser 3.00 (*) 0.50 - 1.35 (mg/dL)    Glucose, Bld 159 (*) 70 - 99 (mg/dL)    Calcium, Ion 1.22  1.12 - 1.32 (mmol/L)    TCO2 30  0 - 100 (mmol/L)    Hemoglobin 10.2 (*) 13.0 - 17.0 (g/dL)    HCT 30.0 (*) 39.0 - 52.0 (%)   GLUCOSE, CAPILLARY     Status: Abnormal   Collection Time   08/27/11  8:48 AM      Component Value Range Comment   Glucose-Capillary 141 (*) 70 - 99 (mg/dL)    Comment 1 Notify RN       Ct Abdomen Pelvis Wo Contrast  08/27/2011  *RADIOLOGY REPORT*  Clinical Data: Worsening bilateral lower quadrant abdominal pain. Surgical history includes recent left superficial inguinal lymph node resection, appendectomy, renal transplant, and hernia repair.  CT ABDOMEN AND PELVIS WITHOUT CONTRAST 08/27/2011:  Technique:  Multidetector CT imaging of the abdomen and pelvis was performed following the standard protocol without intravenous contrast.   Comparison: Unenhanced CT abdomen and pelvis 08/22/2011, 08/04/2011.  Findings: Interval slight increase in size of the fluid collection in the left superficial inguinal region (overlying the common femoral artery and vein, not within the left inguinal canal), current measurements approximating 3.9 x 3.9 cm (series 2, image 97), previously 4.1 x 3.3 cm.  The fluid collection has a Hounsfield measurement of 6, indicating simple fluid.  On the prior examination, gas bubbles were present within the collection, and this is in the area of the recent lymph node resection.  Interval development of thickening of the wall of the gallbladder, with edema/inflammation surrounding the gallbladder.  No biliary ductal dilation.  Normal low dose unenhanced appearance of the liver, spleen, and adrenal glands.  Stable mild pancreatic atrophy. Atrophic native kidneys consistent with end-stage renal disease, without focal parenchymal abnormality.  Moderate to severe aorto- iliofemoral and visceral artery atherosclerosis without aneurysm.  Lymphadenopathy in the retroperitoneum of the abdomen and both sides of the pelvis, unchanged.  Bilateral superficial inguinal lymphadenopathy, unchanged.  No new or enlarging lymphadenopathy. Index left retroperitoneal node approximately 3-4 cm above the aortic bifurcation measures  approximately 2.3 x 1.7 cm (series 2, image 46).  Index right retrocrural lymph node is stable at 1.1 cm (series 2, image 19).  Stomach and small bowel normal in appearance.  Distal descending and sigmoid colon diverticulosis without evidence of acute diverticulitis.  Remainder of the colon unremarkable.  Apparent wall thickening in the cecum is felt to be related to decompression.  Lipoma again noted involving the ileocecal valve. No ascites.  Satisfactory appearance of the renal transplant in the left side of the pelvis, without evidence of hydronephrosis or focal parenchymal abnormality.  Urinary bladder unremarkable.   Prostate gland and seminal vesicles normal.  No evidence of recurrent right inguinal hernia.  Diffuse body wall edema.  Bone window images demonstrate degenerative changes involving the lower thoracic and upper lumbar spine.  Interval increase in size of the now large pericardial effusion. Interval increase in size of a small bilateral pleural effusions. Associated mild passive atelectasis in the lower lobes.  IMPRESSION:  1.  Interval slight increase in size of the postoperative seroma or liquefied hematoma in the left superficial inguinal region at the site of prior lymph node resection. 2.  Stable retroperitoneal and bilateral superficial inguinal lymphadenopathy. 3.  CT findings consistent with acute cholecystitis, new. 4.  Interval increase in size of a now large pericardial effusion. 5.  Interval increase in size of small bilateral pleural effusions. Associated passive atelectasis in the lower lobes, left greater than right. 6.  Distal descending and sigmoid colon diverticulosis without evidence of acute diverticulitis. 7.  Satisfactory unenhanced appearance of the renal transplant in the left side of the pelvis.  These results were called by telephone on 08/27/2011  at  1210 hours to  Dr. Maryan Rued of the emergency department, who verbally acknowledged these results.  Original Report Authenticated By: Deniece Portela, M.D.   US Abdomen Complete  08/27/2011  *RADIOLOGY REPORT*  Clinical Data:  Right upper quadrant pain, nausea; history of kidney transplant  ABDOMINAL ULTRASOUND COMPLETE  Comparison:  None.  Findings:  Gallbladder:   No gallstones are identified at but there is gallbladder wall thickening and pericholecystic fluid.  Common Bile Duct:  Within normal limits in caliber. Measures 3 mm.  Liver: No focal mass lesion identified.  Within normal limits in parenchymal echogenicity.  IVC:  Appears normal.  Pancreas:  No abnormality identified.  Spleen:  Within normal limits in size and echotexture.   Right kidney:  Atrophic. No evidence of mass or hydronephrosis.  Left kidney: Atrophic.  No evidence of mass or hydronephrosis.  The left lower quadrant transplanted kidney is not imaged.  Abdominal Aorta:  No aneurysm identified.  IMPRESSION: Acalculous cholecystitis with gallbladder wall thickening and pericholecystic fluid.  Original Report Authenticated By: Duayne Cal, M.D.   Dg Abd Acute W/chest  08/27/2011  *RADIOLOGY REPORT*  Clinical Data: Abdominal pain and shortness of breath.  ACUTE ABDOMEN SERIES (ABDOMEN 2 VIEW & CHEST 1 VIEW)  Comparison: Chest x-ray fourth 32,013.  Findings: The upright chest x-ray demonstrates stable cardiac enlargement.  The right IJ diatek catheter is stable.  There is vascular congestion and mild pulmonary edema with small pleural effusions.  Two views of the abdomen demonstrate an unremarkable bowel gas pattern.  There is scattered air and stool in the colon.  No distended small bowel loops to suggest obstruction.  No free air. The bony structures are intact.  IMPRESSION:  1.  Cardiac enlargement with vascular congestion, mild interstitial edema and small effusions. 2.  No plain  film findings for an acute abdominal process.  Original Report Authenticated By: P. Kalman Jewels, M.D.    Review of Systems  Constitutional: Positive for weight loss. Negative for fever, chills and diaphoresis.  HENT: Negative for hearing loss.   Eyes: Negative for blurred vision and double vision.  Respiratory: Positive for shortness of breath. Negative for wheezing.   Cardiovascular: Positive for orthopnea, leg swelling and PND. Negative for chest pain and claudication.  Gastrointestinal: Positive for abdominal pain and constipation. Negative for nausea, vomiting, diarrhea, blood in stool and melena.  Musculoskeletal: Positive for back pain.  Skin:       Chronic redness/brawny edema RLE- ?venous insufficiency- states he is supposed to get his leg wrapped next week  Neurological:  Negative for dizziness, tremors, seizures, loss of consciousness, weakness and headaches.  Psychiatric/Behavioral: Negative for suicidal ideas.   Blood pressure 102/55, pulse 81, temperature 98.3 F (36.8 C), temperature source Oral, resp. rate 18, SpO2 94.00%. Physical Exam  Vitals reviewed. Constitutional: He is oriented to person, place, and time. He appears well-developed. No distress.       Morbidly obese; slightly tachypneic  HENT:  Head: Normocephalic and atraumatic.  Right Ear: External ear normal.  Left Ear: External ear normal.  Eyes: Conjunctivae are normal. No scleral icterus.  Neck: Normal range of motion. Neck supple. No tracheal deviation present. No thyromegaly present.  Cardiovascular: Normal rate, regular rhythm and normal heart sounds.        AV fistula prox RUE - +bruit; incision ok  Respiratory: Breath sounds normal. No respiratory distress. He has no wheezes. He has no rales.  GI: Soft. There is no tenderness. There is no rebound and no guarding. A hernia is present. Hernia confirmed positive in the ventral area.         Obese, protuberant. Very very mild RUQ TTP  Musculoskeletal: He exhibits edema (pitting edema up to R knee; edema in RUE).       L BKA  Lymphadenopathy:    He has no cervical adenopathy.  Neurological: He is alert and oriented to person, place, and time. He exhibits normal muscle tone.  Skin: He is diaphoretic.       Weeping brawny edema in RLE with perhaps some mild cellulitis  Psychiatric: He has a normal mood and affect. His behavior is normal. Judgment and thought content normal.    Assessment/Plan: Patient Active Problem List  Diagnoses  . CKD (chronic kidney disease)  . History of renal transplant  . S/P BKA (below knee amputation)  . Obstructive sleep apnea  . Dyspnea  . CHF (congestive heart failure)  . Anemia  . Diabetes mellitus  . Constipation, acute  . Cholecystitis, acute  . ESRD (end stage renal disease)  . Abdominal  pain  -   Protein Calorie Malnutrition  - Morbid obesity  - Pericardial effusion    Although radiologically his gallbladder has changed in appearance from prior imaging, he does not have the classic symptoms of acute cholecystitis. Moreover he has a normal white blood cell count. Given his extensive comorbidities, I recommend obtaining a confirmatory test to confirm his acalculous cholecystitis. I recommend obtaining a HIDA scan to evaluate his gallbladder. In the interim, I would treat him as if he has acute acalculous cholecystitis with IV antibiotics.  He may have meds by mouth.  Also recommend a cardiology consult.  If his HIDA scan confirms acute acalculous cholecystitis, then there are several options to consider. One option would be IV antibiotics  and following his clinical course versus percutaneous cholecystostomy tube versus cholecystectomy. The final plan will depend on the results of the HIDA scan and cardiology's input.  We will follow  Dale [name]. Redmond Pulling, MD, FACS General, Bariatric, & Minimally Invasive Surgery [facility], [place]   [facility] M 08/27/2011, 3:50 PM

## 2011-08-27 NOTE — Consult Note (Signed)
CARDIOLOGY CONSULT NOTE  Patient ID: Dale [name] MRN: [id number], DOB/AGE: February 18, 1966   Admit date: 08/27/2011 Date of Consult: 08/27/2011   Primary Physician: Windy Kalata, MD, MD Primary Cardiologist: New to [place] - seen by B. Stanford Breed, MD  Pt. Profile  46 y/o male w/o prior cardiac history admitted earlier today with acute cholecystitis whom we've been asked to eval 2/2 large pericardial effusion incidentally noted on CT.  Problem List  Past Medical History  Diagnosis Date  . Insulin dependent diabetes mellitus   . Hypertension   . History of blood transfusion   . ESRD (end stage renal disease)     a. 1995 s/p cadaveric transplant w/ susbequent failure after 18 yrs;  b.Dialysis initiated 07/2011  . Dyspnea     a. 07/23/11 Echo: EF 55-60%  . Anemia of chronic disease   . Cholecystitis     a. 08/27/2011  . Constipation   . Pericardial effusion     a.  Small by CT 08/22/11;  b.  Large by CT 08/27/11  . Inguinal lymphadenopathy     a. bilateral - s/p biopsy 07/2011    Past Surgical History  Procedure Date  . Appendectomy   . Hernia repair   . Kidney transplant   . Cataract extraction   . Arteriovenous graft placement   . Av fistula placement   . Toe amputation   . Below knee leg amputation   . Carpal tunnel release   . Shoulder surgery   . Av fistula placement 07/29/2011    Procedure: ARTERIOVENOUS (AV) FISTULA CREATION;  Surgeon: Mal Misty, MD;  Location: [facility];  Service: Vascular;  Laterality: Right;  Brachial cephalic  . Insertion of dialysis catheter 08/01/2011    Procedure: INSERTION OF DIALYSIS CATHETER;  Surgeon: Rosetta Posner, MD;  Location: [place];  Service: Vascular;  Laterality: Right;  insertion of dialysis catheter on right internal jugular vein  . Lymph node biopsy 08/10/2011    Procedure: LYMPH NODE BIOPSY;  Surgeon: Merrie Roof, MD;  Location: [place];  Service: General;  Laterality: Left;  left groin lymph biopsy     Allergies  Allergies    Allergen Reactions  . Amoxicillin Other (See Comments)    Reaction unknown  . Ciprofloxacin Other (See Comments)    Reaction unknown  . Clindamycin/Lincomycin Other (See Comments)    Reaction unknown  . Codeine Other (See Comments)    Reaction unknown  . Levofloxacin Other (See Comments)    Reaction unknown  . Morphine And Related     nausea  . Vasotec Cough    HPI   46 y/o male with the above complex problem list.  He has had multiple hospitalizations this year including admission in early April for anemia req transfusion, during which time he was noted to have inguinal lymphadenopathy with pathology suggesting lymphoproliferative process.  He also had acute renal failure and has required graft and diatek cath placement with initiation of dialysis.  More recently, pt was admitted with abdominal pain and constipation.  Imaging showed no acute findings.  Pt improved and was discharged yesterday.  Following return to home, he developed recurrent abdominal pain and was seen in the [facility] ED, eval, and d/c.  He cont to have abd pain throughout the night and presented to the [facility] ED this AM.  Imaging here has shown cholecystitis and incidental notation of Large pericardial effusion on abd CT.  He denies sob, chest pain.  He has chronic  orthopnea, which is unchanged.  He is hemodynamically stable.  We've been asked to eval.  Inpatient Medications  Prior to Admission medications   Medication Sig Start Date End Date Taking? Authorizing Provider  aspirin 81 MG tablet Take 81 mg by mouth every evening.    Yes Historical Provider, MD  bisacodyl (DULCOLAX) 10 MG suppository Place 10 mg rectally daily as needed. For constipation 08/22/11 09/01/11 Yes Catherine E Schinlever, PA  calcium acetate (PHOSLO) 667 MG capsule Take 2 capsules (1,334 mg total) by mouth 3 (three) times daily with meals. 08/13/11 08/12/12 Yes Nita Sells, MD  cycloSPORINE modified (NEORAL/GENGRAF) 100 MG capsule Take 100  mg by mouth 2 (two) times daily. Neoral brand 06/26/11  Yes Historical Provider, MD  Insulin Human (INSULIN PUMP) 100 unit/ml SOLN Inject 1 each into the skin continuous. Basal rate is 1.2-1.9 units. Varies with time of day/per patient's device. novolog   Yes Historical Provider, MD  isosorbide mononitrate (IMDUR) 60 MG 24 hr tablet Take 1 tablet (60 mg total) by mouth daily. 08/26/11  Yes Hosie Poisson, MD  labetalol (NORMODYNE) 300 MG tablet Take 300 mg by mouth Twice daily. 05/26/11  Yes Historical Provider, MD  lactulose (CEPHULAC) 20 G packet Take 20 g by mouth 3 (three) times daily. constipation 08/22/11 09/01/11 Yes Catherine E Schinlever, PA  levothyroxine (SYNTHROID, LEVOTHROID) 75 MCG tablet Take 75 mg by mouth every evening.  06/26/11  Yes Historical Provider, MD  metoCLOPramide (REGLAN) 5 MG tablet Take 5 mg by mouth 4 (four) times daily - after meals and at bedtime. 05/26/11  Yes Historical Provider, MD  predniSONE (DELTASONE) 5 MG tablet Take 5 mg by mouth Daily. 06/26/11  Yes Historical Provider, MD  promethazine (PHENERGAN) 25 MG tablet Take 25 mg by mouth Every 6 hours as needed. For nausea 07/13/11  Yes Historical Provider, MD  PROVENTIL HFA 108 (90 BASE) MCG/ACT inhaler Inhale 1 puff into the lungs Every 6 hours as needed. For shortness of breath 05/17/11  Yes Historical Provider, MD  ranitidine (ZANTAC) 75 MG tablet Take 75 mg by mouth daily.   Yes Historical Provider, MD  rOPINIRole (REQUIP) 1 MG tablet Take 1 mg by mouth Every evening. Take at 2000 07/18/11  Yes Historical Provider, MD  doxycycline (VIBRA-TABS) 100 MG tablet Take 1 tablet (100 mg total) by mouth 2 (two) times daily. 08/26/11 09/05/11  Hosie Poisson, MD    Family History History reviewed. No pertinent family history.   Social History History   Social History  . Marital Status: Married    Spouse Name: N/A    Number of Children: N/A  . Years of Education: N/A   Occupational History  . Not on file.   Social History Main  Topics  . Smoking status: Never Smoker   . Smokeless tobacco: Never Used  . Alcohol Use: Yes     occasionally 1xmonth  . Drug Use: No  . Sexually Active:    Other Topics Concern  . Not on file   Social History Narrative   Lives @ home with his wife in [place].      Review of Systems  General:  No chills, fever, night sweats or weight changes.  Cardiovascular:  No chest pain, dyspnea on exertion, edema, palpitations, paroxysmal nocturnal dyspnea.  He has chronic orthopnea and sleeps with HOB elevated. Dermatological: No rash, lesions/masses Respiratory: No cough, dyspnea Urologic: No hematuria, dysuria Abdominal:   +++ abdominal discomfort and nausea.  No vomiting, diarrhea, bright red blood per  rectum, melena, or hematemesis Neurologic:  No visual changes, wkns, changes in mental status. All other systems reviewed and are otherwise negative except as noted above.  Physical Exam  Blood pressure 102/55, pulse 81, temperature 98.3 F (36.8 C), temperature source Oral, resp. rate 18, SpO2 94.00%.  General: Pleasant, NAD - groggy. Psych: Normal affect. Neuro: Alert and oriented X 3. Moves all extremities spontaneously. HEENT: Normal  Neck: Supple, obese, without bruits.  Difficult to assess JVP. Lungs:  Resp regular and unlabored w/ bibasilar crackles. Heart: RRR no s3, s4, or murmurs.  No Rub. Abdomen: Soft, non-distended.  Diffusely tender with light palpation.  BS + x 4.  Extremities: Left BKA.  R great toe previously amputated.  Small, healing area on dorsal surface of right 2nd toe.  Right LE with chronic skin changes and weeping.  Weak distal pulse on right.  Labs   Lab Results  Component Value Date   WBC 6.8 08/27/2011   HGB 10.2* 08/27/2011   HCT 30.0* 08/27/2011   MCV 91.8 08/27/2011   PLT 160 08/27/2011     Lab 08/27/11 0840 08/27/11 0820  NA 137 --  K 3.4* --  CL 97 --  CO2 -- 30  BUN 17 --  CREATININE 3.00* --  CALCIUM -- 9.0  PROT -- 6.9  BILITOT -- 0.5    ALKPHOS -- 197*  ALT -- 9  AST -- 13  GLUCOSE 159* --   Radiology/Studies  Ct Abdomen Pelvis Wo Contrast  08/27/2011  *RADIOLOGY REPORT*  Clinical Data: Worsening bilateral lower quadrant abdominal pain. Surgical history includes recent left superficial inguinal lymph node resection, appendectomy, renal transplant, and hernia repair.  CT ABDOMEN AND PELVIS WITHOUT CONTRAST  IMPRESSION:  1.  Interval slight increase in size of the postoperative seroma or liquefied hematoma in the left superficial inguinal region at the site of prior lymph node resection. 2.  Stable retroperitoneal and bilateral superficial inguinal lymphadenopathy. 3.  CT findings consistent with acute cholecystitis, new. 4.  Interval increase in size of a now large pericardial effusion. 5.  Interval increase in size of small bilateral pleural effusions. Associated passive atelectasis in the lower lobes, left greater than right. 6.  Distal descending and sigmoid colon diverticulosis without evidence of acute diverticulitis. 7.  Satisfactory unenhanced appearance of the renal transplant in the left side of the pelvis.  These results were called by telephone on 08/27/2011  at  1210 hours to  Dr. Maryan Rued of the emergency department, who verbally acknowledged these results.  Original Report Authenticated By: Deniece Portela, M.D.   US Abdomen Complete  08/27/2011  *RADIOLOGY REPORT*  Clinical Data:  Right upper quadrant pain, nausea; history of kidney transplant  ABDOMINAL ULTRASOUND COMPLETE   IMPRESSION: Acalculous cholecystitis with gallbladder wall thickening and pericholecystic fluid.  Original Report Authenticated By: Duayne Cal, M.D.   Dg Abd Acute W/chest  08/27/2011  *RADIOLOGY REPORT*  Clinical Data: Abdominal pain and shortness of breath.  ACUTE ABDOMEN SERIES (ABDOMEN 2 VIEW & CHEST 1 VIEW)  IMPRESSION:  1.  Cardiac enlargement with vascular congestion, mild interstitial edema and small effusions. 2.  No plain film  findings for an acute abdominal process.  Original Report Authenticated By: P. Kalman Jewels, M.D.   ECG  Pending.  ASSESSMENT AND PLAN  1.  Pericardial Effusion:  Prelim Echo - Mod effusion with NL LV fxn.  No tamponade physiology and he is hemodynamically stable.    2. Acute Cholecystitis:  Pending HIDA scan  per surgery.  Ideally, if pt requires surgery, he should have stress test first given high risk for coronary dzs.   Signed, Murray Hodgkins, NP 08/27/2011, 4:12 PM  As above, patient seen and examined. 46 year old male with past medical history of diabetes mellitus, hypertension, renal insufficiency with prior transplant now recently resumed on dialysis, peripheral vascular disease for evaluation of pericardial effusion. Patient with recent evaluation for lymphadenopathy. Pathology felt most likely lymphoproliferative disease probably due to immunosuppression. He is also recently had dialysis resumed. The patient did have an echocardiogram in March of 2013 that revealed normal LV function, mild left atrial enlargement no pericardial effusion. The patient has also recently had the pain. An abdominal CT on 08/22/2011 showed a small pericardial effusion by report. Again noted was lymphadenopathy. He was discharged but developed recurrent abdominal pain. He also described dyspnea on exertion but no orthopnea, PND chest pain or increased pedal edema. He has had some nausea. Repeat CT scan today suggested acute acalculous cholecystitis. There is also increase in size of the pericardial effusion to large. Cardiology was asked to evaluate. On examination I do not find clear jugular venous distention.the patient's blood pressure is 102/55 and his pulse is 81. Preliminary quick look at the other shows a moderate pericardial effusion without RV or RA collapse. There is collapse of his IVC. LV function is normal. The etiology of the patient's pericardial effusion is unclear. At present he does not show  clinical signs of  tamonade. We will review his echocardiogram once it is complete. We will most likely plan to repeat his echocardiogram early next week to make sure that it is not enlarging further. Note recent HIV negative. Check TSH. He has had recent evaluation of adenopathy as described above. I doubt this is uremic pericarditis as his BUN is 17. I do not think this would prohibit him from having cholecystectomy if indicated. However I am concerned about his cardiac risk overall. He has had diabetes mellitus for 39 years and has moderate to severe vascular disease noted in his aorta on abdominal CT. If possible I would like an ischemic evaluation preoperatively. If he requires surgery emergently then we can proceed realizing there is higher risk. General surgery is evaluating the patient and has started antibiotics for acalculous cholecystitis. Nephrology will need to follow for renal insufficiency and dialysis. His right upper extremity is edematous and his recent AV fistula. He may require ultrasound to exclude DVT. I will leave this to the primary service. Kirk Ruths

## 2011-08-27 NOTE — ED Notes (Signed)
Pt reports shortness of breath onset after being discharged home yesterday.

## 2011-08-27 NOTE — H&P (Addendum)
PCP:  Windy Kalata, MD, MD   DOA:  08/27/2011  7:11 AM  Chief Complaint:  Abdominal pain and shortness of breath  HPI: Patient is a 46 years old man with history of end-stage renal disease, failed kidney transplant and currently on hemodialysis, he was discharged from the hospital yesterday after he was admitted and treated for abdominal pain and constipation . Patient apparently continued to have abdominal pain after discharge and developed shortness of breath, he went to [facility] ER yesterday and was discharged home with instructions to follow with his dialysis unit today. He then developed worsening shortness of breath and worsening of abdominal pain which he described as pressure like  this morning and he came back with [place]: ER. He denied any fever, chills, nausea or vomiting.He also denies any cough or cheat pain .In the ED CT scan of the abdomen showed acute cholecystitis, surgical service was contacted and we were consulted to admit for further management.  Allergies: Allergies  Allergen Reactions  . Amoxicillin Other (See Comments)    Reaction unknown  . Ciprofloxacin Other (See Comments)    Reaction unknown  . Clindamycin/Lincomycin Other (See Comments)    Reaction unknown  . Codeine Other (See Comments)    Reaction unknown  . Levofloxacin Other (See Comments)    Reaction unknown  . Morphine And Related     nausea  . Vasotec Cough    Prior to Admission medications   Medication Sig Start Date End Date Taking? Authorizing Provider  aspirin 81 MG tablet Take 81 mg by mouth every evening.    Yes Historical Provider, MD  bisacodyl (DULCOLAX) 10 MG suppository Place 10 mg rectally daily as needed. For constipation 08/22/11 09/01/11 Yes Catherine E Schinlever, PA  calcium acetate (PHOSLO) 667 MG capsule Take 2 capsules (1,334 mg total) by mouth 3 (three) times daily with meals. 08/13/11 08/12/12 Yes Nita Sells, MD  cycloSPORINE modified (NEORAL/GENGRAF) 100 MG capsule  Take 100 mg by mouth 2 (two) times daily. Neoral brand 06/26/11  Yes Historical Provider, MD  Insulin Human (INSULIN PUMP) 100 unit/ml SOLN Inject 1 each into the skin continuous. Basal rate is 1.2-1.9 units. Varies with time of day/per patient's device. novolog   Yes Historical Provider, MD  isosorbide mononitrate (IMDUR) 60 MG 24 hr tablet Take 1 tablet (60 mg total) by mouth daily. 08/26/11  Yes Hosie Poisson, MD  labetalol (NORMODYNE) 300 MG tablet Take 300 mg by mouth Twice daily. 05/26/11  Yes Historical Provider, MD  lactulose (CEPHULAC) 20 G packet Take 20 g by mouth 3 (three) times daily. constipation 08/22/11 09/01/11 Yes Catherine E Schinlever, PA  levothyroxine (SYNTHROID, LEVOTHROID) 75 MCG tablet Take 75 mg by mouth every evening.  06/26/11  Yes Historical Provider, MD  metoCLOPramide (REGLAN) 5 MG tablet Take 5 mg by mouth 4 (four) times daily - after meals and at bedtime. 05/26/11  Yes Historical Provider, MD  predniSONE (DELTASONE) 5 MG tablet Take 5 mg by mouth Daily. 06/26/11  Yes Historical Provider, MD  promethazine (PHENERGAN) 25 MG tablet Take 25 mg by mouth Every 6 hours as needed. For nausea 07/13/11  Yes Historical Provider, MD  PROVENTIL HFA 108 (90 BASE) MCG/ACT inhaler Inhale 1 puff into the lungs Every 6 hours as needed. For shortness of breath 05/17/11  Yes Historical Provider, MD  ranitidine (ZANTAC) 75 MG tablet Take 75 mg by mouth daily.   Yes Historical Provider, MD  rOPINIRole (REQUIP) 1 MG tablet Take 1 mg by mouth Every  evening. Take at 2000 07/18/11  Yes Historical Provider, MD  doxycycline (VIBRA-TABS) 100 MG tablet Take 1 tablet (100 mg total) by mouth 2 (two) times daily. 08/26/11 09/05/11  Hosie Poisson, MD    Past Medical History  Diagnosis Date  . Diabetes mellitus   . Hypertension   . Blood transfusion   . Renal disease     Past Surgical History  Procedure Date  . Appendectomy   . Hernia repair   . Kidney transplant   . Cataract extraction   . Arteriovenous graft  placement   . Av fistula placement   . Toe amputation   . Below knee leg amputation   . Carpal tunnel release   . Shoulder surgery   . Av fistula placement 07/29/2011    Procedure: ARTERIOVENOUS (AV) FISTULA CREATION;  Surgeon: Mal Misty, MD;  Location: [place];  Service: Vascular;  Laterality: Right;  Brachial cephalic  . Insertion of dialysis catheter 08/01/2011    Procedure: INSERTION OF DIALYSIS CATHETER;  Surgeon: Rosetta Posner, MD;  Location: [place];  Service: Vascular;  Laterality: Right;  insertion of dialysis catheter on right internal jugular vein  . Lymph node biopsy 08/10/2011    Procedure: LYMPH NODE BIOPSY;  Surgeon: Merrie Roof, MD;  Location: [place];  Service: General;  Laterality: Left;  left groin lymph biopsy    Social History:  Lives with his wife, reports that he has never smoked.[name] Kitchen He reports that he drinks alcohol occasionally. He reports that he does not use illicit drugs.   Review of Systems: As above in history of present illness Constitutional: Denies fever, chills, diaphoresis HEENT: Denies photophobia, eye pain, redness, hearing loss, ear pain, congestion, sore throat, rhinorrhea, sneezing, mouth sores, trouble swallowing, neck pain, neck stiffness and tinnitus.   Respiratory: Denies cough and wheezing.   Cardiovascular: Denies chest pain, palpitations and leg swelling.  Gastrointestinal: Denies nausea, vomiting,diarrhea, blood in stool  Genitourinary: Denies dysuria, urgency, frequency, hematuria, flank pain and difficulty urinating.   Neurological: Denies dizziness, seizures, syncope, weakness, light-headedness, numbness and headaches.      Physical Exam:  Filed Vitals:   08/27/11 0714 08/27/11 0951 08/27/11 1204 08/27/11 1245  BP: 131/51 150/65 134/61 130/61  Pulse: 82 84 83 82  Temp: 98 F (36.7 C)     Resp: 22 20 18 18   SpO2: 100% 100% 97% 98%    Constitutional: Vital signs reviewed.  Patient is morbidly obese in moderate acute distress and  cooperative with exam. Alert and oriented x3.  Eyes: PERRL, EOMI, conjunctivae normal, No scleral icterus.  Cardiovascular: RRR, S1 normal, S2 normal, no MRG, pulses symmetric and intact bilaterally Pulmonary/Chest: Decreased breathing sounds bilaterally, scattered rhonchi. Abdominal: Distended, minimal right upper quadrant tenderness on palpation. bowel sounds are normal, no masses, organomegaly, or guarding present.  Ext: Right BKA, prosthesis in place. Lower Left lower extremity chronic erythema , with ulcer. Neurological: A&O x3, Strenght is normal and symmetric bilaterally, cranial nerve II-XII are grossly intact, no focal motor deficit, sensory intact to light touch bilaterally.    Labs on Admission:  Results for orders placed during the hospital encounter of 08/27/11 (from the past 48 hour(s))  COMPREHENSIVE METABOLIC PANEL     Status: Abnormal   Collection Time   08/27/11  8:20 AM      Component Value Range Comment   Sodium 134 (*) 135 - 145 (mEq/L)    Potassium 3.2 (*) 3.5 - 5.1 (mEq/L)    Chloride  97  96 - 112 (mEq/L)    CO2 30  19 - 32 (mEq/L)    Glucose, Bld 151 (*) 70 - 99 (mg/dL)    BUN 17  6 - 23 (mg/dL)    Creatinine, Ser 3.02 (*) 0.50 - 1.35 (mg/dL)    Calcium 9.0  8.4 - 10.5 (mg/dL)    Total Protein 6.9  6.0 - 8.3 (g/dL)    Albumin 1.9 (*) 3.5 - 5.2 (g/dL)    AST 13  0 - 37 (U/L)    ALT 9  0 - 53 (U/L)    Alkaline Phosphatase 197 (*) 39 - 117 (U/L)    Total Bilirubin 0.5  0.3 - 1.2 (mg/dL)    GFR calc non Af Amer 23 (*) >90 (mL/min)    GFR calc Af Amer 27 (*) >90 (mL/min)   LIPASE, BLOOD     Status: Normal   Collection Time   08/27/11  8:20 AM      Component Value Range Comment   Lipase 26  11 - 59 (U/L)   LACTIC ACID, PLASMA     Status: Normal   Collection Time   08/27/11  8:20 AM      Component Value Range Comment   Lactic Acid, Venous 1.4  0.5 - 2.2 (mmol/L)   CBC     Status: Abnormal   Collection Time   08/27/11  8:20 AM      Component Value Range Comment    WBC 6.8  4.0 - 10.5 (K/uL)    RBC 3.31 (*) 4.22 - 5.81 (MIL/uL)    Hemoglobin 9.0 (*) 13.0 - 17.0 (g/dL)    HCT 30.4 (*) 39.0 - 52.0 (%)    MCV 91.8  78.0 - 100.0 (fL)    MCH 27.2  26.0 - 34.0 (pg)    MCHC 29.6 (*) 30.0 - 36.0 (g/dL)    RDW 19.8 (*) 11.5 - 15.5 (%)    Platelets 160  150 - 400 (K/uL)   DIFFERENTIAL     Status: Abnormal   Collection Time   08/27/11  8:20 AM      Component Value Range Comment   Neutrophils Relative 54  43 - 77 (%)    Neutro Abs 3.7  1.7 - 7.7 (K/uL)    Lymphocytes Relative 24  12 - 46 (%)    Lymphs Abs 1.6  0.7 - 4.0 (K/uL)    Monocytes Relative 11  3 - 12 (%)    Monocytes Absolute 0.7  0.1 - 1.0 (K/uL)    Eosinophils Relative 10 (*) 0 - 5 (%)    Eosinophils Absolute 0.7  0.0 - 0.7 (K/uL)    Basophils Relative 1  0 - 1 (%)    Basophils Absolute 0.1  0.0 - 0.1 (K/uL)   POCT I-STAT, CHEM 8     Status: Abnormal   Collection Time   08/27/11  8:40 AM      Component Value Range Comment   Sodium 137  135 - 145 (mEq/L)    Potassium 3.4 (*) 3.5 - 5.1 (mEq/L)    Chloride 97  96 - 112 (mEq/L)    BUN 17  6 - 23 (mg/dL)    Creatinine, Ser 3.00 (*) 0.50 - 1.35 (mg/dL)    Glucose, Bld 159 (*) 70 - 99 (mg/dL)    Calcium, Ion 1.22  1.12 - 1.32 (mmol/L)    TCO2 30  0 - 100 (mmol/L)    Hemoglobin 10.2 (*) 13.0 - 17.0 (  g/dL)    HCT 30.0 (*) 39.0 - 52.0 (%)   GLUCOSE, CAPILLARY     Status: Abnormal   Collection Time   08/27/11  8:48 AM      Component Value Range Comment   Glucose-Capillary 141 (*) 70 - 99 (mg/dL)    Comment 1 Notify RN        Ct Abdomen Pelvis Wo Contrast  08/27/2011  *RADIOLOGY REPORT*  Clinical Data: Worsening bilateral lower quadrant abdominal pain. Surgical history includes recent left superficial inguinal lymph node resection, appendectomy, renal transplant, and hernia repair.  CT ABDOMEN AND PELVIS WITHOUT CONTRAST 08/27/2011:  Technique:  Multidetector CT imaging of the abdomen and pelvis was performed following the standard protocol without  intravenous contrast.  Comparison: Unenhanced CT abdomen and pelvis 08/22/2011, 08/04/2011.  Findings: Interval slight increase in size of the fluid collection in the left superficial inguinal region (overlying the common femoral artery and vein, not within the left inguinal canal), current measurements approximating 3.9 x 3.9 cm (series 2, image 97), previously 4.1 x 3.3 cm.  The fluid collection has a Hounsfield measurement of 6, indicating simple fluid.  On the prior examination, gas bubbles were present within the collection, and this is in the area of the recent lymph node resection.  Interval development of thickening of the wall of the gallbladder, with edema/inflammation surrounding the gallbladder.  No biliary ductal dilation.  Normal low dose unenhanced appearance of the liver, spleen, and adrenal glands.  Stable mild pancreatic atrophy. Atrophic native kidneys consistent with end-stage renal disease, without focal parenchymal abnormality.  Moderate to severe aorto- iliofemoral and visceral artery atherosclerosis without aneurysm.  Lymphadenopathy in the retroperitoneum of the abdomen and both sides of the pelvis, unchanged.  Bilateral superficial inguinal lymphadenopathy, unchanged.  No new or enlarging lymphadenopathy. Index left retroperitoneal node approximately 3-4 cm above the aortic bifurcation measures approximately 2.3 x 1.7 cm (series 2, image 46).  Index right retrocrural lymph node is stable at 1.1 cm (series 2, image 19).  Stomach and small bowel normal in appearance.  Distal descending and sigmoid colon diverticulosis without evidence of acute diverticulitis.  Remainder of the colon unremarkable.  Apparent wall thickening in the cecum is felt to be related to decompression.  Lipoma again noted involving the ileocecal valve. No ascites.  Satisfactory appearance of the renal transplant in the left side of the pelvis, without evidence of hydronephrosis or focal parenchymal abnormality.  Urinary  bladder unremarkable.  Prostate gland and seminal vesicles normal.  No evidence of recurrent right inguinal hernia.  Diffuse body wall edema.  Bone window images demonstrate degenerative changes involving the lower thoracic and upper lumbar spine.  Interval increase in size of the now large pericardial effusion. Interval increase in size of a small bilateral pleural effusions. Associated mild passive atelectasis in the lower lobes.  IMPRESSION:  1.  Interval slight increase in size of the postoperative seroma or liquefied hematoma in the left superficial inguinal region at the site of prior lymph node resection. 2.  Stable retroperitoneal and bilateral superficial inguinal lymphadenopathy. 3.  CT findings consistent with acute cholecystitis, new. 4.  Interval increase in size of a now large pericardial effusion. 5.  Interval increase in size of small bilateral pleural effusions. Associated passive atelectasis in the lower lobes, left greater than right. 6.  Distal descending and sigmoid colon diverticulosis without evidence of acute diverticulitis. 7.  Satisfactory unenhanced appearance of the renal transplant in the left side of the pelvis.  These  results were called by telephone on 08/27/2011  at  1210 hours to  Dr. Maryan Rued of the emergency department, who verbally acknowledged these results.  Original Report Authenticated By: Deniece Portela, M.D.   US Abdomen Complete  08/27/2011  *RADIOLOGY REPORT*  Clinical Data:  Right upper quadrant pain, nausea; history of kidney transplant  ABDOMINAL ULTRASOUND COMPLETE  Comparison:  None.  Findings:  Gallbladder:   No gallstones are identified at but there is gallbladder wall thickening and pericholecystic fluid.  Common Bile Duct:  Within normal limits in caliber. Measures 3 mm.  Liver: No focal mass lesion identified.  Within normal limits in parenchymal echogenicity.  IVC:  Appears normal.  Pancreas:  No abnormality identified.  Spleen:  Within normal limits in  size and echotexture.  Right kidney:  Atrophic. No evidence of mass or hydronephrosis.  Left kidney: Atrophic.  No evidence of mass or hydronephrosis.  The left lower quadrant transplanted kidney is not imaged.  Abdominal Aorta:  No aneurysm identified.  IMPRESSION: Acalculous cholecystitis with gallbladder wall thickening and pericholecystic fluid.  Original Report Authenticated By: Duayne Cal, M.D.   Dg Abd Acute W/chest  08/27/2011  *RADIOLOGY REPORT*  Clinical Data: Abdominal pain and shortness of breath.  ACUTE ABDOMEN SERIES (ABDOMEN 2 VIEW & CHEST 1 VIEW)  Comparison: Chest x-ray fourth 32,013.  Findings: The upright chest x-ray demonstrates stable cardiac enlargement.  The right IJ diatek catheter is stable.  There is vascular congestion and mild pulmonary edema with small pleural effusions.  Two views of the abdomen demonstrate an unremarkable bowel gas pattern.  There is scattered air and stool in the colon.  No distended small bowel loops to suggest obstruction.  No free air. The bony structures are intact.  IMPRESSION:  1.  Cardiac enlargement with vascular congestion, mild interstitial edema and small effusions. 2.  No plain film findings for an acute abdominal process.  Original Report Authenticated By: P. Kalman Jewels, M.D.       Assessment/Plan Principal Problem:  *Cholecystitis, acute -Keep n.p.o., surgical service was already consulted and they recommended HIDA scan to confirm diagnosis. Continue Zosyn for now. Active Problems:  ESRD (end stage renal disease) Renal service was already consulted, he is scheduled for dialysis today  History of renal transplant Was on prednisone 5 mg and cyclosporin 75 mg (dose decreased,). Will switch to stress dose hydrocortisone, hold cyclosporin for now while n.p.o..  Diabetes mellitus Patient was on insulin pump which is currently turned off, continue with sensitive SSI and monitor CBGs every 4 hours while n.p.o.. diabetic coordinator of  consult Hypertension: Hold by mouth meds, placed on IV labetalol when necessary  Obstructive sleep apnea Continue CPAP  CHF (congestive heart failure)/pericardial effusion CT scan showed increasing size of pericardial effusion currently large. Patient is hemodynamically stable. Will order for 2-D echocardiogram . Monitor on telemetry. Dr. Stanford Breed with [facility] was consulted , he will kindly read  stat echocardiogram and if pericardial effusion is confirmed to evaluate the patient for further management and perioperative evaluation.   Anemia Aranesp  as per renal Right lower extremity ulcer/chronic erythema: Wound care consult. Patient is already on antibiotics. S/P BKA (below knee amputation) CODE STATUS: Full code for now however stated that he he does not wish to be on prolonged life support. DVT prophylaxis: SCDs     Time Spent on Admission: Approximately 50 minutes  Halona Amstutz 08/27/2011, 2:12 PM

## 2011-08-27 NOTE — Progress Notes (Signed)
Patient ID: Dale [name], male   DOB: [birth year], 46 y.o.   MRN: [id number]   [facility] Progress Note    Subjective:   [name] was discharged from the hospital yesterday in the afternoon and states that he feels well until early evening when he started experiencing some shortness of breath and abdominal discomfort. This prompted him to go to be evaluated at the [facility] emergency room. There, he was found to have minimal pulmonary vascular congestion on x-ray and his oxygen saturations ranged from 93-97% on room air. The patient transiently felt better and was discharged from the emergency room with instructions to present to his hemodialysis center this afternoon. This morning however, [name] started experiencing more difficulty with shortness of breath (he describes this as a sensation of being limited in his ability to draw full breath). He reports some abdominal discomfort/distention. In the emergency room so far, workup has revealed that he was likely suffering an acute cholecystitis.    Objective:   BP 130/61  Pulse 82  Temp 98 F (36.7 C)  Resp 18  SpO2 98%  Physical Exam: Gen: Uncomfortable resting on the gurney CVS: Pulse regular in rate and rhythm, heart sounds S1 and S2 normal-no murmurs rubs or gallops. Resp: Diminished breath sounds over the bases due to poor inspiratory efforts otherwise, clear to auscultation without rales/rhonchi Abd: Soft, obese, tender over the right upper quadrant with some guarding. Bowel sounds normal. Ext: Right lower extremity with 1-2+ edema. Left lower extremity in prosthesis.  Labs: BMET  Lab 08/27/11 0840 08/27/11 0820 08/25/11 0730 08/24/11 0500 08/23/11 0228 08/22/11 0746  NA 137 134* 135 130* 134* 134*  K 3.4* 3.2* 3.9 4.3 4.4 4.0  CL 97 97 100 97 99 98  CO2 -- 30 26 27  -- 30  GLUCOSE 159* 151* 105* 269* 124* 98  BUN 17 17 17 12 21 19   CREATININE 3.00* 3.02* 3.13* 2.19* 2.10* 2.15*  ALB -- -- -- -- --  --  CALCIUM -- 9.0 8.5 8.5 -- 9.4  PHOS -- -- -- -- -- --   CBC  Lab 08/27/11 0840 08/27/11 0820 08/25/11 0730 08/24/11 0500 08/23/11 0151 08/22/11 0746  WBC -- 6.8 7.1 6.9 8.2 --  NEUTROABS -- 3.7 -- -- -- 4.6  HGB 10.2* 9.0* 8.2* 9.2* -- --  HCT 30.0* 30.4* 28.1* 30.9* -- --  MCV -- 91.8 92.4 91.7 91.1 --  PLT -- 160 145* 170 200 --     Medications:      .  HYDROmorphone (DILAUDID) injection  1 mg Intravenous Once  . iohexol  20 mL Oral Q1 Hr x 2  . LORazepam  2 mg Oral Once  . ondansetron  4 mg Intravenous Once   Radiological studies: 1. Abdominal ultrasound shows Acalculous cholecystitis with gallbladder wall thickening and pericholecystic fluid. 2. CT scan of the abdomen with oral contrast shows findings consistent with acute cholecystitis, Interval increase in size of a  now large pericardial effusion and Interval increase in size of small bilateral pleural effusions.      Assessment/ Plan:   1. Acute cholecystitis: Await surgical evaluation for further directions with management. Keeping patient n.p.o. in case surgery is needed. Possibly, may respond to medical management/observation 2.ESRD: Plan for hemodialysis today with pain to challenge ultrafiltration in order to try and get relief of pleural/pericardial effusions. In the past, this is been challenging due to borderline blood pressures. 3. Anemia: Restart subcutaneous erythropoietin-Aranesp 4. CKD-MBD:  Hold binders while the patient is n.p.o., will restart this once satisfactory taking oral intake , resume VDRA therapy 5. Nutrition: Currently n.p.o. due to cholecystitis, continue to monitor closely. 6. Hypertension: We'll monitor blood pressures closely, currently n.p.o. and will attempt regulation of volume status.   Elmarie Shiley, MD 08/27/2011, 1:25 PM

## 2011-08-27 NOTE — ED Notes (Signed)
Unable to establish IV access. IV team paged. Pt updated on plan of care. Continues to drink oral contrast for CT scan. Wife remains at bedside.

## 2011-08-27 NOTE — ED Notes (Signed)
Floor nurse unable to take report at the time. Nurse to call back.

## 2011-08-27 NOTE — Progress Notes (Signed)
Full consult to follow.  Ct & U/s show gb wall thickening and pericholecystic fluid, no Gallstones. Pt has very minimal TTP on exam. Normal wbc. ?fluid due to hypoalbuminemia.  rec continuing IV abx to cover for potential acalculous cholecystitis rec HIDA to confirm acute cholecystitis Pt can have a diet.  If HIDA confirms acute cholecystitis, options include IV abx and follow clinical response vs percutaneous cholecystostomy tube vs cholecystectomy.  Pt is at higher risk - CHF, chronic immunosuppression, protein calorie malnutrition, morbid obesity.   Will follow.  See consult note later today  Leighton Ruff. Redmond Pulling, MD, FACS General, Bariatric, & Minimally Invasive Surgery [facility], [place]

## 2011-08-27 NOTE — Progress Notes (Signed)
Patient arrived to room. Patient and wife oriented to room and call bell system and instructed to call for assistance as needed. Call bell within reach.

## 2011-08-27 NOTE — Procedures (Signed)
Pt seen on HD.  Ap 190 Vp 200. BFR 400.  SBP 111

## 2011-08-27 NOTE — ED Provider Notes (Signed)
History     CSN: [id number]  Arrival date & time 08/27/11  0706   First MD Initiated Contact with Patient 08/27/11 [phone]      Chief Complaint  Patient presents with  . Shortness of Breath    (Consider location/radiation/quality/duration/timing/severity/associated sxs/prior treatment) HPI Comments: Patient was discharged from the hospital yesterday after being admitted for abdominal pain and worsening renal function. Patient was found by CT to have diffuse lymph nodes presents as well as a nonspecific fluid collection in his left inguinal region and right-sided constipation. He was given laxatives for his constipation which did produce diarrhea. He states his abdominal pain has not really gotten any better and after discharge it got much worse while he was at home last night. He went to a local emergency room last night where they ultimately decided to discharge him home. He states that he developed worsening shortness of breath and worsening abdominal pain this morning while at home and he decided to come back in because his nephrologist was unable to get him a sooner appointment at dialysis. He is supposed to be dialyzed today at 12:15. He denies any weight gain, fevers, chest pain and states his last bowel movement was 2 days ago  Patient is a 46 y.o. male presenting with abdominal pain. The history is provided by the patient and the spouse.  Abdominal Pain The primary symptoms of the illness include abdominal pain, shortness of breath, nausea and diarrhea. The primary symptoms of the illness do not include fever or vomiting. Episode onset: 1 week. The onset of the illness was gradual. The problem has been gradually worsening.  The pain came on gradually. The abdominal pain has been gradually worsening since its onset. The abdominal pain is located in the LLQ and RLQ. The abdominal pain does not radiate. The severity of the abdominal pain is 9/10. The abdominal pain is relieved by nothing.  Exacerbated by: not worsened by eating and seems to be deep.  The shortness of breath began today. The shortness of breath developed suddenly. The shortness of breath is moderate.  The patient has had a change in bowel habit (While in the hospital patient was receiving sorbitol so he had multiple episodes of diarrhea but he has had no formed stool). Additional symptoms associated with the illness include anorexia. Symptoms associated with the illness do not include chills, diaphoresis or back pain. Associated medical issues comments: Multiple medical problems including status post renal transplant 18 years ago with recent renal failure now back on dialysis.    Past Medical History  Diagnosis Date  . Diabetes mellitus   . Hypertension   . Blood transfusion   . Renal disease     Past Surgical History  Procedure Date  . Appendectomy   . Hernia repair   . Kidney transplant   . Cataract extraction   . Arteriovenous graft placement   . Av fistula placement   . Toe amputation   . Below knee leg amputation   . Carpal tunnel release   . Shoulder surgery   . Av fistula placement 07/29/2011    Procedure: ARTERIOVENOUS (AV) FISTULA CREATION;  Surgeon: Mal Misty, MD;  Location: [facility];  Service: Vascular;  Laterality: Right;  Brachial cephalic  . Insertion of dialysis catheter 08/01/2011    Procedure: INSERTION OF DIALYSIS CATHETER;  Surgeon: Rosetta Posner, MD;  Location: [facility];  Service: Vascular;  Laterality: Right;  insertion of dialysis catheter on right internal jugular vein  .  Lymph node biopsy 08/10/2011    Procedure: LYMPH NODE BIOPSY;  Surgeon: Merrie Roof, MD;  Location: [facility];  Service: General;  Laterality: Left;  left groin lymph biopsy    History reviewed. No pertinent family history.  History  Substance Use Topics  . Smoking status: Never Smoker   . Smokeless tobacco: Never Used  . Alcohol Use: Yes     occasionally 1xmonth      Review of Systems  Constitutional:  Negative for fever, chills and diaphoresis.  Respiratory: Positive for shortness of breath.   Gastrointestinal: Positive for nausea, abdominal pain, diarrhea and anorexia. Negative for vomiting.  Musculoskeletal: Negative for back pain.  All other systems reviewed and are negative.    Allergies  Amoxicillin; Ciprofloxacin; Clindamycin/lincomycin; Codeine; Levofloxacin; Morphine and related; and Vasotec  Home Medications   Current Outpatient Rx  Name Route Sig Dispense Refill  . ASPIRIN 81 MG PO TABS Oral Take 81 mg by mouth every evening.     [name] Kitchen BISACODYL 10 MG RE SUPP Rectal Place 10 mg rectally daily as needed. For constipation    . CALCIUM ACETATE 667 MG PO CAPS Oral Take 2 capsules (1,334 mg total) by mouth 3 (three) times daily with meals. 90 capsule 0  . CYCLOSPORINE MODIFIED 100 MG PO CAPS Oral Take 100 mg by mouth 2 (two) times daily. Neoral brand    . INSULIN PUMP Subcutaneous Inject 1 each into the skin continuous. Basal rate is 1.2-1.9 units. Varies with time of day/per patient's device. novolog    . ISOSORBIDE MONONITRATE ER 60 MG PO TB24 Oral Take 1 tablet (60 mg total) by mouth daily. 60 tablet 0  . LABETALOL HCL 300 MG PO TABS Oral Take 300 mg by mouth Twice daily.    [name] Kitchen LACTULOSE 20 G PO PACK Oral Take 20 g by mouth 3 (three) times daily. constipation    . LEVOTHYROXINE SODIUM 75 MCG PO TABS Oral Take 75 mg by mouth every evening.     [name] Kitchen METOCLOPRAMIDE HCL 5 MG PO TABS Oral Take 5 mg by mouth 4 (four) times daily - after meals and at bedtime.    [name] Kitchen PREDNISONE 5 MG PO TABS Oral Take 5 mg by mouth Daily.    [name] Kitchen PROMETHAZINE HCL 25 MG PO TABS Oral Take 25 mg by mouth Every 6 hours as needed. For nausea    . PROVENTIL HFA 108 (90 BASE) MCG/ACT IN AERS Inhalation Inhale 1 puff into the lungs Every 6 hours as needed. For shortness of breath    . RANITIDINE HCL 75 MG PO TABS Oral Take 75 mg by mouth daily.    [name] Kitchen ROPINIROLE HCL 1 MG PO TABS Oral Take 1 mg by mouth Every evening. Take at  2000    . DOXYCYCLINE HYCLATE 100 MG PO TABS Oral Take 1 tablet (100 mg total) by mouth 2 (two) times daily. 10 tablet 0    BP 131/51  Pulse 82  Temp 98 F (36.7 C)  Resp 22  SpO2 100%  Physical Exam  Nursing note and vitals reviewed. Constitutional: He is oriented to person, place, and time. He appears well-developed and well-nourished. He appears ill. No distress.  HENT:  Head: Normocephalic and atraumatic.  Mouth/Throat: Oropharynx is clear and moist.  Eyes: EOM are normal. Pupils are equal, round, and reactive to light.       Pale conjunctivae  Neck: Normal range of motion. Neck supple.  Cardiovascular: Normal rate, regular rhythm and intact distal  pulses.   No murmur heard. Pulmonary/Chest: Effort normal and breath sounds normal. No respiratory distress. He has no wheezes. He has no rales.  Abdominal: Soft. He exhibits no distension. There is tenderness in the right lower quadrant and left lower quadrant. There is no rigidity, no rebound, no guarding and no tenderness at McBurney's point.       Some edema present in the lower abdomen. Well-healed abdominal scars.  Musculoskeletal: Normal range of motion. He exhibits no edema and no tenderness.  Neurological: He is alert and oriented to person, place, and time.  Skin: Skin is warm and dry. No rash noted. No erythema. There is pallor.  Psychiatric: He has a normal mood and affect. His behavior is normal.    ED Course  Procedures (including critical care time)  Labs Reviewed  COMPREHENSIVE METABOLIC PANEL - Abnormal; Notable for the following:    Sodium 134 (*)    Potassium 3.2 (*)    Glucose, Bld 151 (*)    Creatinine, Ser 3.02 (*)    Albumin 1.9 (*)    Alkaline Phosphatase 197 (*)    GFR calc non Af Amer 23 (*)    GFR calc Af Amer 27 (*)    All other components within normal limits  CBC - Abnormal; Notable for the following:    RBC 3.31 (*)    Hemoglobin 9.0 (*)    HCT 30.4 (*)    MCHC 29.6 (*)    RDW 19.8 (*)      All other components within normal limits  DIFFERENTIAL - Abnormal; Notable for the following:    Eosinophils Relative 10 (*)    All other components within normal limits  POCT I-STAT, CHEM 8 - Abnormal; Notable for the following:    Potassium 3.4 (*)    Creatinine, Ser 3.00 (*)    Glucose, Bld 159 (*)    Hemoglobin 10.2 (*)    HCT 30.0 (*)    All other components within normal limits  GLUCOSE, CAPILLARY - Abnormal; Notable for the following:    Glucose-Capillary 141 (*)    All other components within normal limits  LIPASE, BLOOD  LACTIC ACID, PLASMA   Ct Abdomen Pelvis Wo Contrast  08/27/2011  *RADIOLOGY REPORT*  Clinical Data: Worsening bilateral lower quadrant abdominal pain. Surgical history includes recent left superficial inguinal lymph node resection, appendectomy, renal transplant, and hernia repair.  CT ABDOMEN AND PELVIS WITHOUT CONTRAST 08/27/2011:  Technique:  Multidetector CT imaging of the abdomen and pelvis was performed following the standard protocol without intravenous contrast.  Comparison: Unenhanced CT abdomen and pelvis 08/22/2011, 08/04/2011.  Findings: Interval slight increase in size of the fluid collection in the left superficial inguinal region (overlying the common femoral artery and vein, not within the left inguinal canal), current measurements approximating 3.9 x 3.9 cm (series 2, image 97), previously 4.1 x 3.3 cm.  The fluid collection has a Hounsfield measurement of 6, indicating simple fluid.  On the prior examination, gas bubbles were present within the collection, and this is in the area of the recent lymph node resection.  Interval development of thickening of the wall of the gallbladder, with edema/inflammation surrounding the gallbladder.  No biliary ductal dilation.  Normal low dose unenhanced appearance of the liver, spleen, and adrenal glands.  Stable mild pancreatic atrophy. Atrophic native kidneys consistent with end-stage renal disease, without focal  parenchymal abnormality.  Moderate to severe aorto- iliofemoral and visceral artery atherosclerosis without aneurysm.  Lymphadenopathy in the retroperitoneum of  the abdomen and both sides of the pelvis, unchanged.  Bilateral superficial inguinal lymphadenopathy, unchanged.  No new or enlarging lymphadenopathy. Index left retroperitoneal node approximately 3-4 cm above the aortic bifurcation measures approximately 2.3 x 1.7 cm (series 2, image 46).  Index right retrocrural lymph node is stable at 1.1 cm (series 2, image 19).  Stomach and small bowel normal in appearance.  Distal descending and sigmoid colon diverticulosis without evidence of acute diverticulitis.  Remainder of the colon unremarkable.  Apparent wall thickening in the cecum is felt to be related to decompression.  Lipoma again noted involving the ileocecal valve. No ascites.  Satisfactory appearance of the renal transplant in the left side of the pelvis, without evidence of hydronephrosis or focal parenchymal abnormality.  Urinary bladder unremarkable.  Prostate gland and seminal vesicles normal.  No evidence of recurrent right inguinal hernia.  Diffuse body wall edema.  Bone window images demonstrate degenerative changes involving the lower thoracic and upper lumbar spine.  Interval increase in size of the now large pericardial effusion. Interval increase in size of a small bilateral pleural effusions. Associated mild passive atelectasis in the lower lobes.  IMPRESSION:  1.  Interval slight increase in size of the postoperative seroma or liquefied hematoma in the left superficial inguinal region at the site of prior lymph node resection. 2.  Stable retroperitoneal and bilateral superficial inguinal lymphadenopathy. 3.  CT findings consistent with acute cholecystitis, new. 4.  Interval increase in size of a now large pericardial effusion. 5.  Interval increase in size of small bilateral pleural effusions. Associated passive atelectasis in the lower  lobes, left greater than right. 6.  Distal descending and sigmoid colon diverticulosis without evidence of acute diverticulitis. 7.  Satisfactory unenhanced appearance of the renal transplant in the left side of the pelvis.  These results were called by telephone on 08/27/2011  at  1210 hours to  Dr. Maryan Rued of the emergency department, who verbally acknowledged these results.  Original Report Authenticated By: Deniece Portela, M.D.   Dg Abd Acute W/chest  08/27/2011  *RADIOLOGY REPORT*  Clinical Data: Abdominal pain and shortness of breath.  ACUTE ABDOMEN SERIES (ABDOMEN 2 VIEW & CHEST 1 VIEW)  Comparison: Chest x-ray fourth 32,013.  Findings: The upright chest x-ray demonstrates stable cardiac enlargement.  The right IJ diatek catheter is stable.  There is vascular congestion and mild pulmonary edema with small pleural effusions.  Two views of the abdomen demonstrate an unremarkable bowel gas pattern.  There is scattered air and stool in the colon.  No distended small bowel loops to suggest obstruction.  No free air. The bony structures are intact.  IMPRESSION:  1.  Cardiac enlargement with vascular congestion, mild interstitial edema and small effusions. 2.  No plain film findings for an acute abdominal process.  Original Report Authenticated By: P. Kalman Jewels, M.D.     1. Cholecystitis   2. Dialysis patient   3. Renal transplant disorder       MDM   Patient with a complex medical history status post kidney transplant 18 years ago and recently went back on dialysis 2 weeks ago due to renal failure. Patient also an insulin dependent diabetic on an insulin pump. Patient was discharged from the hospital yesterday after admission for shortness of breath and abdominal pain. At that time he was found to have constipation but also a CT showed diffuse adenopathy and a fluid collection in the left inguinal region which was not specific. Patient states the  abdominal pain has continued to worsen his  last bowel movement was 2 days ago which he states he was having diffuse diarrhea after taking the sore but all. He denies any fever but states he does feel short of breath. He is due for dialysis today at 12:15. He denies any worsening signs of fluid overload at this time. However he states his abdominal pain is only worsening but he denies any obstructive symptoms such as vomiting but does complain of mild nausea. However he was able to eat today and eating does not seem to make his pain worse.    Given his CT findings on April 29 of left inguinal fluid collection with diffuse lymphadenopathy concern for possible forming abscess and since patient is immune compromised he may not be mounting a white blood cell response or fever.  Patient is satting 100% on room air and has basilar rales but no other signs of overt pulmonary edema and 1-2+ edema in his lower extremities.  Also patient's transplanted kidney is in the right lower quadrant and do to recent kidney failure and most likely rejection of his transplant that may be why he is having pain in his lower abdomen as well. CBC, CMP, lipase, lactic acid, CT of the abdomen pending.  12:16 PM Labs are unremarkable with a consistent creatinine. Chest x-ray shows mild fluid overload. CT showed no change in the lower abdomen however concern for possible acute cholecystitis. On reevaluation of the patient he does have right upper quadrant tenderness. Ultrasound ordered for further evaluation      Blanchie Dessert, MD 08/27/11 1349

## 2011-08-28 ENCOUNTER — Inpatient Hospital Stay (HOSPITAL_COMMUNITY): Payer: Medicare Other

## 2011-08-28 DIAGNOSIS — N186 End stage renal disease: Secondary | ICD-10-CM

## 2011-08-28 DIAGNOSIS — K810 Acute cholecystitis: Secondary | ICD-10-CM

## 2011-08-28 DIAGNOSIS — D649 Anemia, unspecified: Secondary | ICD-10-CM

## 2011-08-28 DIAGNOSIS — I318 Other specified diseases of pericardium: Secondary | ICD-10-CM

## 2011-08-28 DIAGNOSIS — N039 Chronic nephritic syndrome with unspecified morphologic changes: Secondary | ICD-10-CM

## 2011-08-28 DIAGNOSIS — D631 Anemia in chronic kidney disease: Secondary | ICD-10-CM

## 2011-08-28 LAB — COMPREHENSIVE METABOLIC PANEL
ALT: 8 U/L (ref 0–53)
AST: 15 U/L (ref 0–37)
Albumin: 2 g/dL — ABNORMAL LOW (ref 3.5–5.2)
Alkaline Phosphatase: 207 U/L — ABNORMAL HIGH (ref 39–117)
BUN: 7 mg/dL (ref 6–23)
CO2: 25 mEq/L (ref 19–32)
Calcium: 8.4 mg/dL (ref 8.4–10.5)
Chloride: 100 mEq/L (ref 96–112)
Creatinine, Ser: 2.06 mg/dL — ABNORMAL HIGH (ref 0.50–1.35)
GFR calc Af Amer: 43 mL/min — ABNORMAL LOW (ref >90)
GFR calc non Af Amer: 37 mL/min — ABNORMAL LOW (ref >90)
Glucose, Bld: 147 mg/dL — ABNORMAL HIGH (ref 70–99)
Potassium: 5 mEq/L (ref 3.5–5.1)
Sodium: 135 mEq/L (ref 135–145)
Total Bilirubin: 0.6 mg/dL (ref 0.3–1.2)
Total Protein: 7.3 g/dL (ref 6.0–8.3)

## 2011-08-28 LAB — GLUCOSE, CAPILLARY
Glucose-Capillary: 100 mg/dL — ABNORMAL HIGH (ref 70–99)
Glucose-Capillary: 102 mg/dL — ABNORMAL HIGH (ref 70–99)
Glucose-Capillary: 167 mg/dL — ABNORMAL HIGH (ref 70–99)
Glucose-Capillary: 195 mg/dL — ABNORMAL HIGH (ref 70–99)
Glucose-Capillary: 298 mg/dL — ABNORMAL HIGH (ref 70–99)
Glucose-Capillary: 359 mg/dL — ABNORMAL HIGH (ref 70–99)
Glucose-Capillary: 406 mg/dL — ABNORMAL HIGH (ref 70–99)
Glucose-Capillary: 414 mg/dL — ABNORMAL HIGH (ref 70–99)
Glucose-Capillary: 89 mg/dL (ref 70–99)

## 2011-08-28 LAB — CBC
HCT: 34.4 % — ABNORMAL LOW (ref 39.0–52.0)
Hemoglobin: 10.1 g/dL — ABNORMAL LOW (ref 13.0–17.0)
MCH: 27.2 pg (ref 26.0–34.0)
MCHC: 29.4 g/dL — ABNORMAL LOW (ref 30.0–36.0)
MCV: 92.5 fL (ref 78.0–100.0)
Platelets: 135 10*3/uL — ABNORMAL LOW (ref 150–400)
RBC: 3.72 MIL/uL — ABNORMAL LOW (ref 4.22–5.81)
RDW: 20.6 % — ABNORMAL HIGH (ref 11.5–15.5)
WBC: 6.9 10*3/uL (ref 4.0–10.5)

## 2011-08-28 LAB — TSH: TSH: 5.483 u[IU]/mL — ABNORMAL HIGH (ref 0.350–4.500)

## 2011-08-28 LAB — T4, FREE: Free T4: 1.61 ng/dL (ref 0.80–1.80)

## 2011-08-28 MED ORDER — INSULIN ASPART 100 UNIT/ML ~~LOC~~ SOLN
10.0000 [IU] | Freq: Once | SUBCUTANEOUS | Status: AC
  Administered 2011-08-28: 10 [IU] via SUBCUTANEOUS

## 2011-08-28 MED ORDER — CYCLOSPORINE MODIFIED (NEORAL) 25 MG PO CAPS
75.0000 mg | ORAL_CAPSULE | Freq: Two times a day (BID) | ORAL | Status: DC
  Administered 2011-08-28 – 2011-09-06 (×19): 75 mg via ORAL
  Filled 2011-08-28 (×22): qty 3

## 2011-08-28 MED ORDER — TECHNETIUM TC 99M MEBROFENIN IV KIT
5.0000 | PACK | Freq: Once | INTRAVENOUS | Status: AC | PRN
  Administered 2011-08-28: 5 via INTRAVENOUS

## 2011-08-28 MED ORDER — DARBEPOETIN ALFA-POLYSORBATE 25 MCG/0.42ML IJ SOLN: 25.0000 ug | INTRAMUSCULAR | Status: DC

## 2011-08-28 MED ORDER — PREDNISONE 5 MG PO TABS
5.0000 mg | ORAL_TABLET | Freq: Every day | ORAL | Status: DC
  Administered 2011-08-29 – 2011-09-06 (×9): 5 mg via ORAL
  Filled 2011-08-28 (×10): qty 1

## 2011-08-28 MED ORDER — CYCLOSPORINE 25 MG PO CAPS
75.0000 mg | ORAL_CAPSULE | Freq: Two times a day (BID) | ORAL | Status: DC
  Filled 2011-08-28 (×2): qty 3

## 2011-08-28 MED ORDER — ASPIRIN 81 MG PO TABS: 81.0000 mg | ORAL_TABLET | Freq: Every evening | ORAL | Status: DC

## 2011-08-28 MED ORDER — PEG 3350-KCL-NA BICARB-NACL 420 G PO SOLR
4000.0000 mL | Freq: Once | ORAL | Status: AC
  Administered 2011-08-28: 4000 mL via ORAL
  Filled 2011-08-28: qty 4000

## 2011-08-28 MED ORDER — BISACODYL 10 MG RE SUPP: 10.0000 mg | Freq: Every day | RECTAL | Status: DC | PRN

## 2011-08-28 MED ORDER — ASPIRIN 81 MG PO CHEW
81.0000 mg | CHEWABLE_TABLET | Freq: Every day | ORAL | Status: DC
  Administered 2011-08-28 – 2011-09-06 (×10): 81 mg via ORAL
  Filled 2011-08-28 (×10): qty 1

## 2011-08-28 MED ORDER — LEVOTHYROXINE SODIUM 75 MCG PO TABS
75.0000 ug | ORAL_TABLET | Freq: Every day | ORAL | Status: DC
  Administered 2011-08-28 – 2011-09-06 (×10): 75 ug via ORAL
  Filled 2011-08-28 (×11): qty 1

## 2011-08-28 MED ORDER — LABETALOL HCL 300 MG PO TABS
300.0000 mg | ORAL_TABLET | Freq: Two times a day (BID) | ORAL | Status: DC
  Administered 2011-08-28 – 2011-08-29 (×3): 300 mg via ORAL
  Filled 2011-08-28 (×7): qty 1

## 2011-08-28 NOTE — Progress Notes (Signed)
Please see full cardiology consult note from 5/4 per Dr. Stanford Breed. [name] had HD this AM and is now undergoing HIDA scan for further assessment of gallbladder disease - no definite plan for surgery as yet based on chart review. He has been hemodynamically stable, HR 80-90 and SBP recently 109-124. Echocardiogram shows moderate pericardial effusion which will need to be followed. TSH is pending.  We will plan a folloup limited echocardiogram on Tuesday to reassess pericardial effusion. If elective gallbladder surgery is needed, would arrange preoperative Myoview for risk stratification.  We will continue to follow with you.  Satira Sark, M.D., F.A.C.C.

## 2011-08-28 NOTE — Consult Note (Addendum)
[facility] Consultation  Referring Provider: No ref. provider found Primary Care Physician:  Windy Kalata, MD, MD Primary Gastroenterologist:  Lolita Rieger  Reason for Consultation:  Lower abdominal pain  HPI: Dale [name] is a 46 y.o. male who has been having mid and lower abd. pain  and tenderness for several weeks. He had a prolonged hospitalization in  March ,April and now. Renal transplant in 1995, failed in 2013, back on HD x 2 months, his pain bothers him daily and he is also very tender on the right side. Biliary work up not diagnostic for gall bladder disease. Pain started since dialysis. He has dropped his B.P on several occasions during dialysis, CT scan recently did not show any evidence of colitis. He had a colonoscopy by Dr Sharlett Iles in 2008.Denies rectal bleeding. Severe constipation   Past Medical History  Diagnosis Date  . Insulin dependent diabetes mellitus   . Hypertension   . History of blood transfusion   . ESRD (end stage renal disease)     a. 1995 s/p cadaveric transplant w/ susbequent failure after 18 yrs;  b.Dialysis initiated 07/2011  . Dyspnea     a. 07/23/11 Echo: EF 55-60%  . Anemia of chronic disease   . Cholecystitis     a. 08/27/2011  . Constipation   . Pericardial effusion     a.  Small by CT 08/22/11;  b.  Large by CT 08/27/11  . Inguinal lymphadenopathy     a. bilateral - s/p biopsy 07/2011    Past Surgical History  Procedure Date  . Appendectomy   . Hernia repair   . Kidney transplant   . Cataract extraction   . Arteriovenous graft placement   . Av fistula placement   . Toe amputation   . Below knee leg amputation   . Carpal tunnel release   . Shoulder surgery   . Av fistula placement 07/29/2011    Procedure: ARTERIOVENOUS (AV) FISTULA CREATION;  Surgeon: Mal Misty, MD;  Location: [facility];  Service: Vascular;  Laterality: Right;  Brachial cephalic  . Insertion of dialysis catheter 08/01/2011    Procedure: INSERTION OF  DIALYSIS CATHETER;  Surgeon: Rosetta Posner, MD;  Location: [facility];  Service: Vascular;  Laterality: Right;  insertion of dialysis catheter on right internal jugular vein  . Lymph node biopsy 08/10/2011    Procedure: LYMPH NODE BIOPSY;  Surgeon: Merrie Roof, MD;  Location: [facility];  Service: General;  Laterality: Left;  left groin lymph biopsy    Prior to Admission medications   Medication Sig Start Date End Date Taking? Authorizing Provider  aspirin 81 MG tablet Take 81 mg by mouth every evening.    Yes Historical Provider, MD  bisacodyl (DULCOLAX) 10 MG suppository Place 10 mg rectally daily as needed. For constipation 08/22/11 09/01/11 Yes Catherine E Schinlever, PA  calcium acetate (PHOSLO) 667 MG capsule Take 2 capsules (1,334 mg total) by mouth 3 (three) times daily with meals. 08/13/11 08/12/12 Yes Nita Sells, MD  cycloSPORINE modified (NEORAL/GENGRAF) 100 MG capsule Take 100 mg by mouth 2 (two) times daily. Neoral brand 06/26/11  Yes Historical Provider, MD  Insulin Human (INSULIN PUMP) 100 unit/ml SOLN Inject 1 each into the skin continuous. Basal rate is 1.2-1.9 units. Varies with time of day/per patient's device. novolog   Yes Historical Provider, MD  isosorbide mononitrate (IMDUR) 60 MG 24 hr tablet Take 1 tablet (60 mg total) by mouth daily. 08/26/11  Yes Hosie Poisson, MD  labetalol (NORMODYNE) 300 MG tablet Take 300 mg by mouth Twice daily. 05/26/11  Yes Historical Provider, MD  lactulose (CEPHULAC) 20 G packet Take 20 g by mouth 3 (three) times daily. constipation 08/22/11 09/01/11 Yes Catherine E Schinlever, PA  levothyroxine (SYNTHROID, LEVOTHROID) 75 MCG tablet Take 75 mg by mouth every evening.  06/26/11  Yes Historical Provider, MD  metoCLOPramide (REGLAN) 5 MG tablet Take 5 mg by mouth 4 (four) times daily - after meals and at bedtime. 05/26/11  Yes Historical Provider, MD  predniSONE (DELTASONE) 5 MG tablet Take 5 mg by mouth Daily. 06/26/11  Yes Historical Provider, MD  promethazine  (PHENERGAN) 25 MG tablet Take 25 mg by mouth Every 6 hours as needed. For nausea 07/13/11  Yes Historical Provider, MD  PROVENTIL HFA 108 (90 BASE) MCG/ACT inhaler Inhale 1 puff into the lungs Every 6 hours as needed. For shortness of breath 05/17/11  Yes Historical Provider, MD  ranitidine (ZANTAC) 75 MG tablet Take 75 mg by mouth daily.   Yes Historical Provider, MD  rOPINIRole (REQUIP) 1 MG tablet Take 1 mg by mouth Every evening. Take at 2000 07/18/11  Yes Historical Provider, MD  doxycycline (VIBRA-TABS) 100 MG tablet Take 1 tablet (100 mg total) by mouth 2 (two) times daily. 08/26/11 09/05/11  Hosie Poisson, MD    Current Facility-Administered Medications  Medication Dose Route Frequency Provider Last Rate Last Dose  . 0.9 %  sodium chloride infusion  100 mL Intravenous PRN Merrily Tegeler K. Posey Pronto, MD      . 0.9 %  sodium chloride infusion  100 mL Intravenous PRN Ulice Dash K. Posey Pronto, MD      . albuterol (PROVENTIL) (5 MG/ML) 0.5% nebulizer solution 2.5 mg  2.5 mg Nebulization Q6H PRN Silvestre Moment, MD      . alteplase (CATHFLO ACTIVASE) injection 2 mg  2 mg Intracatheter Once PRN Ulice Dash K. Posey Pronto, MD      . aspirin chewable tablet 81 mg  81 mg Oral Daily Silvestre Moment, MD      . bisacodyl (DULCOLAX) suppository 10 mg  10 mg Rectal Daily PRN Silvestre Moment, MD      . cycloSPORINE modified (NEORAL/GENGRAF) capsule 75 mg  75 mg Oral BID Silvestre Moment, MD      . darbepoetin Kyra Searles) injection 25 mcg  25 mcg Intravenous Q Thu-HD Luvenia Heller, FNP      . heparin injection 1,000 Units  1,000 Units Dialysis PRN Clayborne Dana. Posey Pronto, MD      . HYDROmorphone (DILAUDID) injection 1 mg  1 mg Intravenous Q3H PRN Silvestre Moment, MD   1 mg at 08/28/11 1351  . insulin aspart (novoLOG) injection 0-5 Units  0-5 Units Subcutaneous QHS Silvestre Moment, MD      . insulin aspart (novoLOG) injection 0-9 Units  0-9 Units Subcutaneous TID WC Silvestre Moment, MD   2 Units at 08/28/11 1313  . labetalol (NORMODYNE) tablet 300 mg  300 mg  Oral BID Silvestre Moment, MD      . levothyroxine (SYNTHROID, LEVOTHROID) tablet 75 mcg  75 mcg Oral QAC breakfast Silvestre Moment, MD      . lidocaine (XYLOCAINE) 1 % injection 5 mL  5 mL Intradermal PRN Clayborne Dana. Posey Pronto, MD      . lidocaine-prilocaine (EMLA) cream 1 application  1 application Topical PRN Ulice Dash K. Posey Pronto, MD      . ondansetron [facility]) tablet 4 mg  4 mg Oral Q6H PRN  Silvestre Moment, MD       Or  . ondansetron [facility]) injection 4 mg  4 mg Intravenous Q6H PRN Silvestre Moment, MD   4 mg at 08/28/11 1042  . pentafluoroprop-tetrafluoroeth (GEBAUERS) aerosol 1 application  1 application Topical PRN Taleigha Pinson K. Posey Pronto, MD      . piperacillin-tazobactam (ZOSYN) IVPB 2.25 g  2.25 g Intravenous Q8H Silvestre Moment, MD   2.25 g at 08/28/11 1511  . polyethylene glycol-electrolytes (NuLYTELY/GoLYTELY) solution 4,000 mL  4,000 mL Oral Once Lafayette Dragon, MD      . predniSONE (DELTASONE) tablet 5 mg  5 mg Oral Q breakfast Silvestre Moment, MD      . technetium TC 30M mebrofenin (CHOLETEC) injection 5 milli Curie  5 milli Curie Intravenous Once PRN Medication Radiologist, MD   [place] at 08/28/11 0900  . DISCONTD: aspirin tablet 81 mg  81 mg Oral QPM Silvestre Moment, MD      . DISCONTD: cycloSPORINE (SANDIMMUNE) capsule 75 mg  75 mg Oral BID Silvestre Moment, MD      . DISCONTD: hydrocortisone sodium succinate (SOLU-CORTEF) 100 mg/2 mL injection 50 mg  50 mg Intravenous Q8H Silvestre Moment, MD      . DISCONTD: labetalol (NORMODYNE,TRANDATE) injection 10 mg  10 mg Intravenous Q12H PRN Silvestre Moment, MD        Allergies as of 08/27/2011 - Review Complete 08/27/2011  Allergen Reaction Noted  . Amoxicillin Other (See Comments) 03/04/2011  . Ciprofloxacin Other (See Comments) 03/04/2011  . Clindamycin/lincomycin Other (See Comments) 03/04/2011  . Codeine Other (See Comments) 07/22/2011  . Levofloxacin Other (See Comments) 07/22/2011  . Morphine and related  08/22/2011  . Vasotec Cough  03/18/2011    History reviewed. No pertinent family history.  History   Social History  . Marital Status: Married    Spouse Name: N/A    Number of Children: N/A  . Years of Education: N/A   Occupational History  . Not on file.   Social History Main Topics  . Smoking status: Never Smoker   . Smokeless tobacco: Never Used  . Alcohol Use: Yes     occasionally 1xmonth  . Drug Use: No  . Sexually Active:    Other Topics Concern  . Not on file   Social History Narrative   Lives @ home with his wife in [place].     Review of Systems: Gen: Denies any fever, chills, sweats, anorexia, fatigue, weakness, malaise, weight loss, and sleep disorder CV: Denies chest pain, angina, palpitations, syncope, orthopnea, PND, peripheral edema, and claudication. Resp: Denies dyspnea at rest, dyspnea with exercise, cough, sputum, wheezing, coughing up blood, and pleurisy. GI: Denies vomiting blood, jaundice, and fecal incontinence.   Denies dysphagia or odynophagia.[name] Kitchen weakness, cramps, atrophy. Positive for constipation Derm: Denies rash, itching, dry skin, hives, moles, warts, or unhealing ulcers.  Psych: Denies depression, anxiety, memory loss, suicidal ideation, hallucinations, paranoia, and confusion. Heme: Denies bruising, bleeding, and enlarged lymph nodes. Neuro:  Denies any headaches, dizziness, paresthesias. Endo:  Denies any problems with DM, thyroid, adrenal function. Post BK amputations Physical Exam: Vital signs in last 24 hours: Temp:  [97.9 F (36.6 C)-99 F (37.2 C)] 98.6 F (37 C) (05/05 1630) Pulse Rate:  [80-94] 93  (05/05 1630) Resp:  [16-24] 18  (05/05 1630) BP: (87-161)/(42-75) 155/65 mmHg (05/05 1630) SpO2:  [94 %-100 %] 97 % (05/05 1630) Weight:  [253 lb 4.9 oz (114.9 kg)] 253  lb 4.9 oz (114.9 kg) (05/04 2248) Last BM Date: 08/25/11 General:   Alert,  Well-developed, well-nourished, pleasant and cooperative in NAD overweight, Cushinoid features Head:   Normocephalic and atraumatic. Eyes:  Sclera clear, no icterus.   Conjunctiva pink. Ears:  Normal auditory acuity.. Neck:  Supple; no masses or thyromegaly. Lungs:  Clear throughout to auscultation.   No wheezes, crackles, or rhonchi. No acute distress. Heart:  Regular rate and rhythm; no murmurs, clicks, rubs,  or gallops. Abdomen:obese, very tender periumbilical to RMQ, no rebound, small umbilical hernia, difficult exam due to large pannus, well healed scars Rectal:  No stool ,heme negative mucous.   Msk:  S/p bilateral BK amput Pulses:  Normal pulses noted. Extremities:  Without clubbing or edema. Neurologic:  Alert and  oriented x4;  grossly normal neurologically. Skin:  Intact without significant lesions or rashes. Cervical Nodes:  No significant cervical adenopathy. Psych:  Alert and cooperative. Normal mood and affect.  Intake/Output from previous day: 05/04 0701 - 05/05 0700 In: -  Out: 2450 [Urine:400] Intake/Output this shift: Total I/O In: 240 [P.O.:240] Out: -   Lab Results:  Basename 08/28/11 0630 08/27/11 0840 08/27/11 0820  WBC 6.9 -- 6.8  HGB 10.1* 10.2* 9.0*  HCT 34.4* 30.0* 30.4*  PLT 135* -- 160   BMET  Basename 08/28/11 0630 08/27/11 0840 08/27/11 0820  NA 135 137 134*  K 5.0 3.4* 3.2*  CL 100 97 97  CO2 25 -- 30  GLUCOSE 147* 159* 151*  BUN 7 17 17   CREATININE 2.06* 3.00* 3.02*  CALCIUM 8.4 -- 9.0   LFT  Basename 08/28/11 0630  PROT 7.3  ALBUMIN 2.0*  AST 15  ALT 8  ALKPHOS 207*  BILITOT 0.6  BILIDIR --  IBILI --   PT/INR No results found for this basename: LABPROT:2,INR:2 in the last 72 hours Hepatitis Panel No results found for this basename: HEPBSAG,HCVAB,HEPAIGM,HEPBIGM in the last 72 hours  Studies/Results: Ct Abdomen Pelvis Wo Contrast  08/27/2011  *RADIOLOGY REPORT*  Clinical Data: Worsening bilateral lower quadrant abdominal pain. Surgical history includes recent left superficial inguinal lymph node resection, appendectomy,  renal transplant, and hernia repair.  CT ABDOMEN AND PELVIS WITHOUT CONTRAST 08/27/2011:  Technique:  Multidetector CT imaging of the abdomen and pelvis was performed following the standard protocol without intravenous contrast.  Comparison: Unenhanced CT abdomen and pelvis 08/22/2011, 08/04/2011.  Findings: Interval slight increase in size of the fluid collection in the left superficial inguinal region (overlying the common femoral artery and vein, not within the left inguinal canal), current measurements approximating 3.9 x 3.9 cm (series 2, image 97), previously 4.1 x 3.3 cm.  The fluid collection has a Hounsfield measurement of 6, indicating simple fluid.  On the prior examination, gas bubbles were present within the collection, and this is in the area of the recent lymph node resection.  Interval development of thickening of the wall of the gallbladder, with edema/inflammation surrounding the gallbladder.  No biliary ductal dilation.  Normal low dose unenhanced appearance of the liver, spleen, and adrenal glands.  Stable mild pancreatic atrophy. Atrophic native kidneys consistent with end-stage renal disease, without focal parenchymal abnormality.  Moderate to severe aorto- iliofemoral and visceral artery atherosclerosis without aneurysm.  Lymphadenopathy in the retroperitoneum of the abdomen and both sides of the pelvis, unchanged.  Bilateral superficial inguinal lymphadenopathy, unchanged.  No new or enlarging lymphadenopathy. Index left retroperitoneal node approximately 3-4 cm above the aortic bifurcation measures approximately 2.3 x 1.7 cm (series 2,  image 46).  Index right retrocrural lymph node is stable at 1.1 cm (series 2, image 19).  Stomach and small bowel normal in appearance.  Distal descending and sigmoid colon diverticulosis without evidence of acute diverticulitis.  Remainder of the colon unremarkable.  Apparent wall thickening in the cecum is felt to be related to decompression.  Lipoma again  noted involving the ileocecal valve. No ascites.  Satisfactory appearance of the renal transplant in the left side of the pelvis, without evidence of hydronephrosis or focal parenchymal abnormality.  Urinary bladder unremarkable.  Prostate gland and seminal vesicles normal.  No evidence of recurrent right inguinal hernia.  Diffuse body wall edema.  Bone window images demonstrate degenerative changes involving the lower thoracic and upper lumbar spine.  Interval increase in size of the now large pericardial effusion. Interval increase in size of a small bilateral pleural effusions. Associated mild passive atelectasis in the lower lobes.  IMPRESSION:  1.  Interval slight increase in size of the postoperative seroma or liquefied hematoma in the left superficial inguinal region at the site of prior lymph node resection. 2.  Stable retroperitoneal and bilateral superficial inguinal lymphadenopathy. 3.  CT findings consistent with acute cholecystitis, new. 4.  Interval increase in size of a now large pericardial effusion. 5.  Interval increase in size of small bilateral pleural effusions. Associated passive atelectasis in the lower lobes, left greater than right. 6.  Distal descending and sigmoid colon diverticulosis without evidence of acute diverticulitis. 7.  Satisfactory unenhanced appearance of the renal transplant in the left side of the pelvis.  These results were called by telephone on 08/27/2011  at  1210 hours to  Dr. Maryan Rued of the emergency department, who verbally acknowledged these results.  Original Report Authenticated By: Deniece Portela, M.D.   Nm Hepatobiliary Liver Func  08/28/2011  *RADIOLOGY REPORT*  Clinical data: Abdominal pain.  Gallbladder wall thickening on ultrasound.  HEPATOBILIARY SCINTIGRAPHY WITH EJECTION FRACTION  Anterior imaging after5.61mCi Tc99M Choletec IV. There is prompt clearance of the radiopharmaceutical from the blood pool. Timely visualization of activity in central bile  ducts, small bowel, and gallbladder. After 1 hour,2.3 mcg CCK was infused intravenously and imaging continued. The patient describes no pain with infusion. The calculated gallbladder ejection fraction over 30 minutes is 86% (normal >30% at  thirty minutes [facility] et al., 1992 J. Nuclear Med).  IMPRESSION 1. Patency of cystic and common bile ducts. 2. Normal gallbladder ejection fraction.  Original Report Authenticated By: Trecia Rogers, M.D.   US Abdomen Complete  08/27/2011  *RADIOLOGY REPORT*  Clinical Data:  Right upper quadrant pain, nausea; history of kidney transplant  ABDOMINAL ULTRASOUND COMPLETE  Comparison:  None.  Findings:  Gallbladder:   No gallstones are identified at but there is gallbladder wall thickening and pericholecystic fluid.  Common Bile Duct:  Within normal limits in caliber. Measures 3 mm.  Liver: No focal mass lesion identified.  Within normal limits in parenchymal echogenicity.  IVC:  Appears normal.  Pancreas:  No abnormality identified.  Spleen:  Within normal limits in size and echotexture.  Right kidney:  Atrophic. No evidence of mass or hydronephrosis.  Left kidney: Atrophic.  No evidence of mass or hydronephrosis.  The left lower quadrant transplanted kidney is not imaged.  Abdominal Aorta:  No aneurysm identified.  IMPRESSION: Acalculous cholecystitis with gallbladder wall thickening and pericholecystic fluid.  Original Report Authenticated By: Duayne Cal, M.D.   Dg Abd Acute W/chest  08/27/2011  *RADIOLOGY REPORT*  Clinical Data: Abdominal pain and shortness of breath.  ACUTE ABDOMEN SERIES (ABDOMEN 2 VIEW & CHEST 1 VIEW)  Comparison: Chest x-ray fourth 32,013.  Findings: The upright chest x-ray demonstrates stable cardiac enlargement.  The right IJ diatek catheter is stable.  There is vascular congestion and mild pulmonary edema with small pleural effusions.  Two views of the abdomen demonstrate an unremarkable bowel gas pattern.  There is scattered air and  stool in the colon.  No distended small bowel loops to suggest obstruction.  No free air. The bony structures are intact.  IMPRESSION:  1.  Cardiac enlargement with vascular congestion, mild interstitial edema and small effusions. 2.  No plain film findings for an acute abdominal process.  Original Report Authenticated By: P. Kalman Jewels, M.D.     Previous Endoscopies: EGD/colon 2008 Dr Sharlett Iles  Impression / Plan: Predominantly lower abd. pain , started with HD, have to suspect "low flow state" bowl ischemia, may be superimposed on diabetic visceral neuropathy. Question of colonic ileus/inertia. Chronic constipation likely due to slow transit. Doubt diverticulitis or IBD. CT scan does not show colitis . No hematochezia. Will proceed with colonoscopy, Golytely prep. Would like to ask renal about hypotensive episodes during dialysis. I have discussed the diff Dx with the pt and his wife, they agree to proceed.   LOS: 1 day   Delfin Edis  08/28/2011, 5:57 PM   Addendum:  The consult H&P was reviewed, the patient was examined and there is no significant change in the patients condition since that H&P was completed. The nature of the procedure, as well as the risks, benefits, and alternatives were carefully and thoroughly reviewed with the patient. Ample time for discussion and questions allowed. The patient understood, was satisfied, and agreed to proceed with colonoscopy.  Of note patient had improvement in lower abd pain after colon prep.   Arbor Cohen M  08/29/2011, 12:06 PM

## 2011-08-28 NOTE — Progress Notes (Signed)
HS CBG is 406.  Called [facility] and rec'd order for Novolog SQ 10 Units.  Will give and recheck BS at 0200.  Will continue to monitor patient.  Kaslyn Rich[place], Thea Gist. 08/28/2011

## 2011-08-28 NOTE — Progress Notes (Signed)
Pt refused CPAP at this time. He does not have his home mask and states he will not be able to tolerate the mask provided by the hospital. RT encouraged to call if he would like to wear CPAP.

## 2011-08-28 NOTE — Progress Notes (Signed)
Subjective:  Still having abdominal pain radiating to back; negative HIDA scan and awaiting GI evaluation  Vital signs in last 24 hours: Filed Vitals:   08/27/11 2248 08/28/11 0000 08/28/11 0410 08/28/11 1356  BP: 109/56 124/53 109/52 161/75  Pulse: 83 94 82 86  Temp: 97.9 F (36.6 C) 99 F (37.2 C) 98.7 F (37.1 C) 98.2 F (36.8 C)  TempSrc: Oral Oral Oral Oral  Resp: 22 20 18 18   Weight: 114.9 kg (253 lb 4.9 oz)     SpO2: 94% 97% 99% 100%   Weight change:   Intake/Output Summary (Last 24 hours) at 08/28/11 1405 Last data filed at 08/28/11 0300  Gross per 24 hour  Intake      0 ml  Output   2450 ml  Net  -2450 ml   Labs: Basic Metabolic Panel:  Lab [id number] 0630 08/27/11 0840 08/27/11 0820 08/25/11 0730  NA 135 137 134* --  K 5.0 3.4* 3.2* --  CL 100 97 97 --  CO2 25 -- 30 26  GLUCOSE 147* 159* 151* --  BUN 7 17 17  --  CREATININE 2.06* 3.00* 3.02* --  CALCIUM 8.4 -- 9.0 8.5  ALB -- -- -- --  PHOS -- -- -- --   Liver Function Tests:  Lab 08/28/11 0630 08/27/11 0820  AST 15 13  ALT 8 9  ALKPHOS 207* 197*  BILITOT 0.6 0.5  PROT 7.3 6.9  ALBUMIN 2.0* 1.9*    Lab 08/27/11 0820  LIPASE 26  AMYLASE --   No results found for this basename: AMMONIA:3 in the last 168 hours CBC:  Lab 08/28/11 0630 08/27/11 0840 08/27/11 0820 08/25/11 0730 08/24/11 0500 08/23/11 0151 08/22/11 0746  WBC 6.9 -- 6.8 7.1 -- -- --  NEUTROABS -- -- 3.7 -- -- -- 4.6  HGB 10.1* 10.2* 9.0* -- -- -- --  HCT 34.4* 30.0* 30.4* -- -- -- --  MCV 92.5 -- 91.8 92.4 91.7 91.1 --  PLT 135* -- 160 145* -- -- --   Cardiac Enzymes: No results found for this basename: CKTOTAL:5,CKMB:5,CKMBINDEX:5,TROPONINI:5 in the last 168 hours CBG:  Lab 08/28/11 0811 08/28/11 0405 08/28/11 0249 08/27/11 2346 08/27/11 1616  GLUCAP 167* 100* 102* 89 157*    Iron Studies: No results found for this basename: IRON,TIBC,TRANSFERRIN,FERRITIN in the last 72 hours Studies/Results: Ct Abdomen Pelvis Wo  Contrast  08/27/2011  *RADIOLOGY REPORT*  Clinical Data: Worsening bilateral lower quadrant abdominal pain. Surgical history includes recent left superficial inguinal lymph node resection, appendectomy, renal transplant, and hernia repair.  CT ABDOMEN AND PELVIS WITHOUT CONTRAST 08/27/2011:  Technique:  Multidetector CT imaging of the abdomen and pelvis was performed following the standard protocol without intravenous contrast.  Comparison: Unenhanced CT abdomen and pelvis 08/22/2011, 08/04/2011.  Findings: Interval slight increase in size of the fluid collection in the left superficial inguinal region (overlying the common femoral artery and vein, not within the left inguinal canal), current measurements approximating 3.9 x 3.9 cm (series 2, image 97), previously 4.1 x 3.3 cm.  The fluid collection has a Hounsfield measurement of 6, indicating simple fluid.  On the prior examination, gas bubbles were present within the collection, and this is in the area of the recent lymph node resection.  Interval development of thickening of the wall of the gallbladder, with edema/inflammation surrounding the gallbladder.  No biliary ductal dilation.  Normal low dose unenhanced appearance of the liver, spleen, and adrenal glands.  Stable mild pancreatic atrophy. Atrophic native kidneys consistent  with end-stage renal disease, without focal parenchymal abnormality.  Moderate to severe aorto- iliofemoral and visceral artery atherosclerosis without aneurysm.  Lymphadenopathy in the retroperitoneum of the abdomen and both sides of the pelvis, unchanged.  Bilateral superficial inguinal lymphadenopathy, unchanged.  No new or enlarging lymphadenopathy. Index left retroperitoneal node approximately 3-4 cm above the aortic bifurcation measures approximately 2.3 x 1.7 cm (series 2, image 46).  Index right retrocrural lymph node is stable at 1.1 cm (series 2, image 19).  Stomach and small bowel normal in appearance.  Distal descending and  sigmoid colon diverticulosis without evidence of acute diverticulitis.  Remainder of the colon unremarkable.  Apparent wall thickening in the cecum is felt to be related to decompression.  Lipoma again noted involving the ileocecal valve. No ascites.  Satisfactory appearance of the renal transplant in the left side of the pelvis, without evidence of hydronephrosis or focal parenchymal abnormality.  Urinary bladder unremarkable.  Prostate gland and seminal vesicles normal.  No evidence of recurrent right inguinal hernia.  Diffuse body wall edema.  Bone window images demonstrate degenerative changes involving the lower thoracic and upper lumbar spine.  Interval increase in size of the now large pericardial effusion. Interval increase in size of a small bilateral pleural effusions. Associated mild passive atelectasis in the lower lobes.  IMPRESSION:  1.  Interval slight increase in size of the postoperative seroma or liquefied hematoma in the left superficial inguinal region at the site of prior lymph node resection. 2.  Stable retroperitoneal and bilateral superficial inguinal lymphadenopathy. 3.  CT findings consistent with acute cholecystitis, new. 4.  Interval increase in size of a now large pericardial effusion. 5.  Interval increase in size of small bilateral pleural effusions. Associated passive atelectasis in the lower lobes, left greater than right. 6.  Distal descending and sigmoid colon diverticulosis without evidence of acute diverticulitis. 7.  Satisfactory unenhanced appearance of the renal transplant in the left side of the pelvis.  These results were called by telephone on 08/27/2011  at  1210 hours to  Dr. Maryan Rued of the emergency department, who verbally acknowledged these results.  Original Report Authenticated By: Deniece Portela, M.D.   Nm Hepatobiliary Liver Func  08/28/2011  *RADIOLOGY REPORT*  Clinical data: Abdominal pain.  Gallbladder wall thickening on ultrasound.  HEPATOBILIARY  SCINTIGRAPHY WITH EJECTION FRACTION  Anterior imaging after5.77mCi Tc99M Choletec IV. There is prompt clearance of the radiopharmaceutical from the blood pool. Timely visualization of activity in central bile ducts, small bowel, and gallbladder. After 1 hour,2.3 mcg CCK was infused intravenously and imaging continued. The patient describes no pain with infusion. The calculated gallbladder ejection fraction over 30 minutes is 86% (normal >30% at  thirty minutes [name] et al., 1992 J. Nuclear Med).  IMPRESSION 1. Patency of cystic and common bile ducts. 2. Normal gallbladder ejection fraction.  Original Report Authenticated By: Trecia Rogers, M.D.   US Abdomen Complete  08/27/2011  *RADIOLOGY REPORT*  Clinical Data:  Right upper quadrant pain, nausea; history of kidney transplant  ABDOMINAL ULTRASOUND COMPLETE  Comparison:  None.  Findings:  Gallbladder:   No gallstones are identified at but there is gallbladder wall thickening and pericholecystic fluid.  Common Bile Duct:  Within normal limits in caliber. Measures 3 mm.  Liver: No focal mass lesion identified.  Within normal limits in parenchymal echogenicity.  IVC:  Appears normal.  Pancreas:  No abnormality identified.  Spleen:  Within normal limits in size and echotexture.  Right kidney:  Atrophic. No evidence of mass or hydronephrosis.  Left kidney: Atrophic.  No evidence of mass or hydronephrosis.  The left lower quadrant transplanted kidney is not imaged.  Abdominal Aorta:  No aneurysm identified.  IMPRESSION: Acalculous cholecystitis with gallbladder wall thickening and pericholecystic fluid.  Original Report Authenticated By: Duayne Cal, M.D.   Dg Abd Acute W/chest  08/27/2011  *RADIOLOGY REPORT*  Clinical Data: Abdominal pain and shortness of breath.  ACUTE ABDOMEN SERIES (ABDOMEN 2 VIEW & CHEST 1 VIEW)  Comparison: Chest x-ray fourth 32,013.  Findings: The upright chest x-ray demonstrates stable cardiac enlargement.  The right IJ diatek  catheter is stable.  There is vascular congestion and mild pulmonary edema with small pleural effusions.  Two views of the abdomen demonstrate an unremarkable bowel gas pattern.  There is scattered air and stool in the colon.  No distended small bowel loops to suggest obstruction.  No free air. The bony structures are intact.  IMPRESSION:  1.  Cardiac enlargement with vascular congestion, mild interstitial edema and small effusions. 2.  No plain film findings for an acute abdominal process.  Original Report Authenticated By: P. Kalman Jewels, M.D.   Medications:      . aspirin  81 mg Oral Daily  . cycloSPORINE modified  75 mg Oral BID  .  HYDROmorphone (DILAUDID) injection  1 mg Intravenous Once  . insulin aspart  0-5 Units Subcutaneous QHS  . insulin aspart  0-9 Units Subcutaneous TID WC  . labetalol  300 mg Oral BID  . levothyroxine  75 mcg Oral QAC breakfast  . piperacillin-tazobactam (ZOSYN)  IV  2.25 g Intravenous Once  . piperacillin-tazobactam (ZOSYN)  IV  2.25 g Intravenous Q8H  . predniSONE  5 mg Oral Q breakfast  . DISCONTD: aspirin  81 mg Oral QPM  . DISCONTD: cycloSPORINE  75 mg Oral BID  . DISCONTD: hydrocortisone sod succinate (SOLU-CORTEF) injection  50 mg Intravenous Q8H  . DISCONTD: iohexol  20 mL Oral Q1 Hr x 2    I  have reviewed scheduled and prn medications.  Physical Exam: General: awake,alert, pleasant; no acute distress Heart: RRR Lungs: no adventitious B.S. Abdomen: morbidly obese Extremities: small weeping wound RLE; Left BKA  Dialysis Access: RIJ PC; maturing RUA AVF  Problem/Plan: 1. SOB- resolved with HD last pm; SD echo pending (57) 2. Acute Cholecystitis- per abdominal ultrasound and CT scan; negative N/V; still having abd pain; negative HIDA scan; surgery following; GI to see  3. Abdominal pain/Constipation- workup in progress with GI to see 4. ESRD/s/p failed transplant - TTS SWAF; on VDRA; next HD tu 5. Anemia - Hgb 10.1; start Aranesp and follow  trend 6. Secondary hyperparathyroidism - on Zemplar; resume binders when taking po; follow ca/phos 7. HTN/volume - controlled on meds (Labetalol) and UF with HD 8. Nutrition - albumin 2.0; negative HIDA; diet resumed 9. DM/gastroparesis- (has insulin pump at home); CBG and SSI while inpt; HgbA1c 6.8% 10. Venous/Vascular insufficiency- wound care to see 11. Hypothyroidism- on synthroid; TSH 5.48 on adm; possible factor constipation; defer to primary 12. OSA/Obesity- wears CPAP; wife to bring   56. Disposition- per primary services  Marni Griffon, FNP-C [facility] Pager [phone]  08/28/2011,2:05 PM  LOS: 1 day

## 2011-08-28 NOTE — Progress Notes (Signed)
Subjective: C/o constipation.  Still has mild diffuse abdominal pain  Objective: Vital signs in last 24 hours: Temp:  [97.1 F (36.2 C)-99 F (37.2 C)] 98.7 F (37.1 C) (05/05 0410) Pulse Rate:  [80-94] 82  (05/05 0410) Resp:  [16-24] 18  (05/05 0410) BP: (87-138)/(42-87) 109/52 mmHg (05/05 0410) SpO2:  [94 %-99 %] 99 % (05/05 0410) Weight:  [253 lb 4.9 oz (114.9 kg)-258 lb 6.1 oz (117.2 kg)] 253 lb 4.9 oz (114.9 kg) (05/04 2248) Last BM Date: 08/25/11  Intake/Output from previous day: 05/04 0701 - 05/05 0700 In: -  Out: 2450 [Urine:400] Intake/Output this shift:    General appearance: alert, cooperative and no distress GI: soft, mild tenderness diffusely, nondistended, no peritoneal signs, umbilical hernia without evidence of incarceration  Lab Results:   Basename 08/28/11 0630 08/27/11 0840 08/27/11 0820  WBC 6.9 -- 6.8  HGB 10.1* 10.2* --  HCT 34.4* 30.0* --  PLT 135* -- 160   BMET  Basename 08/28/11 0630 08/27/11 0840 08/27/11 0820  NA 135 137 --  K 5.0 3.4* --  CL 100 97 --  CO2 25 -- 30  GLUCOSE 147* 159* --  BUN 7 17 --  CREATININE 2.06* 3.00* --  CALCIUM 8.4 -- 9.0   PT/INR No results found for this basename: LABPROT:2,INR:2 in the last 72 hours ABG No results found for this basename: PHART:2,PCO2:2,PO2:2,HCO3:2 in the last 72 hours  Studies/Results: Ct Abdomen Pelvis Wo Contrast  08/27/2011  *RADIOLOGY REPORT*  Clinical Data: Worsening bilateral lower quadrant abdominal pain. Surgical history includes recent left superficial inguinal lymph node resection, appendectomy, renal transplant, and hernia repair.  CT ABDOMEN AND PELVIS WITHOUT CONTRAST 08/27/2011:  Technique:  Multidetector CT imaging of the abdomen and pelvis was performed following the standard protocol without intravenous contrast.  Comparison: Unenhanced CT abdomen and pelvis 08/22/2011, 08/04/2011.  Findings: Interval slight increase in size of the fluid collection in the left superficial  inguinal region (overlying the common femoral artery and vein, not within the left inguinal canal), current measurements approximating 3.9 x 3.9 cm (series 2, image 97), previously 4.1 x 3.3 cm.  The fluid collection has a Hounsfield measurement of 6, indicating simple fluid.  On the prior examination, gas bubbles were present within the collection, and this is in the area of the recent lymph node resection.  Interval development of thickening of the wall of the gallbladder, with edema/inflammation surrounding the gallbladder.  No biliary ductal dilation.  Normal low dose unenhanced appearance of the liver, spleen, and adrenal glands.  Stable mild pancreatic atrophy. Atrophic native kidneys consistent with end-stage renal disease, without focal parenchymal abnormality.  Moderate to severe aorto- iliofemoral and visceral artery atherosclerosis without aneurysm.  Lymphadenopathy in the retroperitoneum of the abdomen and both sides of the pelvis, unchanged.  Bilateral superficial inguinal lymphadenopathy, unchanged.  No new or enlarging lymphadenopathy. Index left retroperitoneal node approximately 3-4 cm above the aortic bifurcation measures approximately 2.3 x 1.7 cm (series 2, image 46).  Index right retrocrural lymph node is stable at 1.1 cm (series 2, image 19).  Stomach and small bowel normal in appearance.  Distal descending and sigmoid colon diverticulosis without evidence of acute diverticulitis.  Remainder of the colon unremarkable.  Apparent wall thickening in the cecum is felt to be related to decompression.  Lipoma again noted involving the ileocecal valve. No ascites.  Satisfactory appearance of the renal transplant in the left side of the pelvis, without evidence of hydronephrosis or focal parenchymal abnormality.  Urinary bladder unremarkable.  Prostate gland and seminal vesicles normal.  No evidence of recurrent right inguinal hernia.  Diffuse body wall edema.  Bone window images demonstrate  degenerative changes involving the lower thoracic and upper lumbar spine.  Interval increase in size of the now large pericardial effusion. Interval increase in size of a small bilateral pleural effusions. Associated mild passive atelectasis in the lower lobes.  IMPRESSION:  1.  Interval slight increase in size of the postoperative seroma or liquefied hematoma in the left superficial inguinal region at the site of prior lymph node resection. 2.  Stable retroperitoneal and bilateral superficial inguinal lymphadenopathy. 3.  CT findings consistent with acute cholecystitis, new. 4.  Interval increase in size of a now large pericardial effusion. 5.  Interval increase in size of small bilateral pleural effusions. Associated passive atelectasis in the lower lobes, left greater than right. 6.  Distal descending and sigmoid colon diverticulosis without evidence of acute diverticulitis. 7.  Satisfactory unenhanced appearance of the renal transplant in the left side of the pelvis.  These results were called by telephone on 08/27/2011  at  1210 hours to  Dr. Maryan Rued of the emergency department, who verbally acknowledged these results.  Original Report Authenticated By: Deniece Portela, M.D.   Nm Hepatobiliary Liver Func  08/28/2011  *RADIOLOGY REPORT*  Clinical data: Abdominal pain.  Gallbladder wall thickening on ultrasound.  HEPATOBILIARY SCINTIGRAPHY WITH EJECTION FRACTION  Anterior imaging after5.59mCi Tc99M Choletec IV. There is prompt clearance of the radiopharmaceutical from the blood pool. Timely visualization of activity in central bile ducts, small bowel, and gallbladder. After 1 hour,2.3 mcg CCK was infused intravenously and imaging continued. The patient describes no pain with infusion. The calculated gallbladder ejection fraction over 30 minutes is 86% (normal >30% at  thirty minutes [facility] et al., 1992 J. Nuclear Med).  IMPRESSION 1. Patency of cystic and common bile ducts. 2. Normal gallbladder ejection  fraction.  Original Report Authenticated By: Trecia Rogers, M.D.   US Abdomen Complete  08/27/2011  *RADIOLOGY REPORT*  Clinical Data:  Right upper quadrant pain, nausea; history of kidney transplant  ABDOMINAL ULTRASOUND COMPLETE  Comparison:  None.  Findings:  Gallbladder:   No gallstones are identified at but there is gallbladder wall thickening and pericholecystic fluid.  Common Bile Duct:  Within normal limits in caliber. Measures 3 mm.  Liver: No focal mass lesion identified.  Within normal limits in parenchymal echogenicity.  IVC:  Appears normal.  Pancreas:  No abnormality identified.  Spleen:  Within normal limits in size and echotexture.  Right kidney:  Atrophic. No evidence of mass or hydronephrosis.  Left kidney: Atrophic.  No evidence of mass or hydronephrosis.  The left lower quadrant transplanted kidney is not imaged.  Abdominal Aorta:  No aneurysm identified.  IMPRESSION: Acalculous cholecystitis with gallbladder wall thickening and pericholecystic fluid.  Original Report Authenticated By: Duayne Cal, M.D.   Dg Abd Acute W/chest  08/27/2011  *RADIOLOGY REPORT*  Clinical Data: Abdominal pain and shortness of breath.  ACUTE ABDOMEN SERIES (ABDOMEN 2 VIEW & CHEST 1 VIEW)  Comparison: Chest x-ray fourth 32,013.  Findings: The upright chest x-ray demonstrates stable cardiac enlargement.  The right IJ diatek catheter is stable.  There is vascular congestion and mild pulmonary edema with small pleural effusions.  Two views of the abdomen demonstrate an unremarkable bowel gas pattern.  There is scattered air and stool in the colon.  No distended small bowel loops to suggest obstruction.  No  free air. The bony structures are intact.  IMPRESSION:  1.  Cardiac enlargement with vascular congestion, mild interstitial edema and small effusions. 2.  No plain film findings for an acute abdominal process.  Original Report Authenticated By: P. Kalman Jewels, M.D.     Anti-infectives: Anti-infectives     Start     Dose/Rate Route Frequency Ordered Stop   08/27/11 2200   piperacillin-tazobactam (ZOSYN) IVPB 2.25 g        2.25 g 100 mL/hr over 30 Minutes Intravenous 3 times per day 08/27/11 1542     08/27/11 1400   piperacillin-tazobactam (ZOSYN) IVPB 2.25 g        2.25 g 100 mL/hr over 30 Minutes Intravenous  Once 08/27/11 1330 08/27/11 1506          Assessment/Plan: s/p * No surgery found * [place] without stones, HIDA negative.  This is certainly a difficult problem. He has persistent and nonspecific abdominal pain without a definite source.  Given negative HIDA, would recommend further workup of abdominal pain prior to surgery especially given his multiple medical problems.  This could still be GB related but may be constipation, or PUD as well.   LOS: 1 day    Madilyn Hook DAVID 08/28/2011

## 2011-08-28 NOTE — Progress Notes (Signed)
Subjective: Patient seen and examined, still complaining of mild diffuse abdominal pain and constipation.  Objective: Vital signs in last 24 hours: Temp:  [97.1 F (36.2 C)-99 F (37.2 C)] 98.7 F (37.1 C) (05/05 0410) Pulse Rate:  [80-94] 82  (05/05 0410) Resp:  [16-24] 18  (05/05 0410) BP: (87-138)/(42-87) 109/52 mmHg (05/05 0410) SpO2:  [94 %-99 %] 99 % (05/05 0410) Weight:  [114.9 kg (253 lb 4.9 oz)-117.2 kg (258 lb 6.1 oz)] 114.9 kg (253 lb 4.9 oz) (05/04 2248) Weight change:  Last BM Date: 08/25/11  Intake/Output from previous day: 05/04 0701 - 05/05 0700 In: -  Out: 2450 [Urine:400]     Physical Exam: General: Alert, awake, oriented x3, in no acute distress. Heart: Regular rate and rhythm, without murmurs, rubs, gallops. Lungs: Clear to auscultation bilaterally. Abdomen: Distended, diffuse mild tenderness without guarding or rebound. positive bowel sounds. Extremities: Left BKA, right lower leg erythema which seems to be chronic, 2 areas of skin abrasions without drainage. Neuro: Grossly intact, nonfocal.    Lab Results: Results for orders placed during the hospital encounter of 08/27/11 (from the past 24 hour(s))  GLUCOSE, CAPILLARY     Status: Abnormal   Collection Time   08/27/11  4:16 PM      Component Value Range   Glucose-Capillary 157 (*) 70 - 99 (mg/dL)  TSH     Status: Abnormal   Collection Time   08/27/11  5:00 PM      Component Value Range   TSH 5.483 (*) 0.350 - 4.500 (uIU/mL)  GLUCOSE, CAPILLARY     Status: Normal   Collection Time   08/27/11 11:46 PM      Component Value Range   Glucose-Capillary 89  70 - 99 (mg/dL)  GLUCOSE, CAPILLARY     Status: Abnormal   Collection Time   08/28/11  2:49 AM      Component Value Range   Glucose-Capillary 102 (*) 70 - 99 (mg/dL)  GLUCOSE, CAPILLARY     Status: Abnormal   Collection Time   08/28/11  4:05 AM      Component Value Range   Glucose-Capillary 100 (*) 70 - 99 (mg/dL)   Comment 1 Notify RN      COMPREHENSIVE METABOLIC PANEL     Status: Abnormal   Collection Time   08/28/11  6:30 AM      Component Value Range   Sodium 135  135 - 145 (mEq/L)   Potassium 5.0  3.5 - 5.1 (mEq/L)   Chloride 100  96 - 112 (mEq/L)   CO2 25  19 - 32 (mEq/L)   Glucose, Bld 147 (*) 70 - 99 (mg/dL)   BUN 7  6 - 23 (mg/dL)   Creatinine, Ser 2.06 (*) 0.50 - 1.35 (mg/dL)   Calcium 8.4  8.4 - 10.5 (mg/dL)   Total Protein 7.3  6.0 - 8.3 (g/dL)   Albumin 2.0 (*) 3.5 - 5.2 (g/dL)   AST 15  0 - 37 (U/L)   ALT 8  0 - 53 (U/L)   Alkaline Phosphatase 207 (*) 39 - 117 (U/L)   Total Bilirubin 0.6  0.3 - 1.2 (mg/dL)   GFR calc non Af Amer 37 (*) >90 (mL/min)   GFR calc Af Amer 43 (*) >90 (mL/min)  CBC     Status: Abnormal   Collection Time   08/28/11  6:30 AM      Component Value Range   WBC 6.9  4.0 - 10.5 (K/uL)  RBC 3.72 (*) 4.22 - 5.81 (MIL/uL)   Hemoglobin 10.1 (*) 13.0 - 17.0 (g/dL)   HCT 34.4 (*) 39.0 - 52.0 (%)   MCV 92.5  78.0 - 100.0 (fL)   MCH 27.2  26.0 - 34.0 (pg)   MCHC 29.4 (*) 30.0 - 36.0 (g/dL)   RDW 20.6 (*) 11.5 - 15.5 (%)   Platelets 135 (*) 150 - 400 (K/uL)  GLUCOSE, CAPILLARY     Status: Abnormal   Collection Time   08/28/11  8:11 AM      Component Value Range   Glucose-Capillary 167 (*) 70 - 99 (mg/dL)    Studies/Results:   Medications:    . aspirin  81 mg Oral Daily  . cycloSPORINE modified  75 mg Oral BID  .  HYDROmorphone (DILAUDID) injection  1 mg Intravenous Once  . insulin aspart  0-5 Units Subcutaneous QHS  . insulin aspart  0-9 Units Subcutaneous TID WC  . labetalol  300 mg Oral BID  . levothyroxine  75 mcg Oral QAC breakfast  . piperacillin-tazobactam (ZOSYN)  IV  2.25 g Intravenous Once  . piperacillin-tazobactam (ZOSYN)  IV  2.25 g Intravenous Q8H  . predniSONE  5 mg Oral Q breakfast  . DISCONTD: aspirin  81 mg Oral QPM  . DISCONTD: cycloSPORINE  75 mg Oral BID  . DISCONTD: hydrocortisone sod succinate (SOLU-CORTEF) injection  50 mg Intravenous Q8H  .  DISCONTD: iohexol  20 mL Oral Q1 Hr x 2    sodium chloride, sodium chloride, albuterol, alteplase, bisacodyl, heparin, HYDROmorphone (DILAUDID) injection, lidocaine, lidocaine-prilocaine, ondansetron (ZOFRAN) IV, ondansetron, pentafluoroprop-tetrafluoroeth, technetium TC 53M mebrofenin, DISCONTD: feeding supplement (NEPRO CARB STEADY), DISCONTD: labetalol     Assessment/Plan: Principal Problem:  *Abdominal pain/suspected Cholecystitis, acute  -HIDA scan is negative, etiology of his abdominal pain is currently unclear. -Will start on soft mechanical diet and advance as tolerated. -Dr. Johna Roles ,with [facility] was consulted and she will kindly seen in consultation. -Continue laxatives when necessary -Empirically on Zosyn.  Active Problems:  ESRD (end stage renal disease)  Renal serviceis following, he was dialyzed yesterday.  History of renal transplant  Was on prednisone 5 mg and cyclosporin 75 mg (dose  recently decreased,).Resume medications.   Diabetes mellitus  Patient was on insulin pump which is currently turned off, continue with sensitive SSI and monitor CBGs . diabetic coordinator Was consulted.  Hypertension:   controlled, resume by mouth labetalol.  Obstructive sleep apnea  Continue CPAP  CHF (congestive heart failure)/pericardial effusion  -Appreciated cardiology input. Echocardiogram showed moderate pericardial effusion , plans to repeat echocardiogram on Tuesday to reassess. 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 Hypothyroidism:  -Resume Synthroid, elevated TSH noted, will check free T4 level and adjust medicine as needed.  Anemia  Aranesp as per renal  Right lower extremity ulcer/chronic erythema:  Wound care consulted. Patient is already on antibiotics.  S/P BKA (below knee amputation)  CODE STATUS: Full code for now however stated that he he does not wish to be on prolonged life  support.          LOS: 1 day   Latravious Levitt 08/28/2011, 1:48 PM

## 2011-08-29 ENCOUNTER — Encounter (HOSPITAL_COMMUNITY)
Admission: EM | Disposition: A | Discharge status: Home Health | Payer: Self-pay | Source: Home / Self Care | Attending: Internal Medicine

## 2011-08-29 ENCOUNTER — Encounter (HOSPITAL_COMMUNITY): Payer: Self-pay | Admitting: *Deleted

## 2011-08-29 DIAGNOSIS — I319 Disease of pericardium, unspecified: Secondary | ICD-10-CM

## 2011-08-29 DIAGNOSIS — R109 Unspecified abdominal pain: Secondary | ICD-10-CM

## 2011-08-29 DIAGNOSIS — K810 Acute cholecystitis: Secondary | ICD-10-CM

## 2011-08-29 DIAGNOSIS — N186 End stage renal disease: Secondary | ICD-10-CM

## 2011-08-29 DIAGNOSIS — D631 Anemia in chronic kidney disease: Secondary | ICD-10-CM

## 2011-08-29 DIAGNOSIS — K5900 Constipation, unspecified: Secondary | ICD-10-CM

## 2011-08-29 DIAGNOSIS — I318 Other specified diseases of pericardium: Secondary | ICD-10-CM

## 2011-08-29 DIAGNOSIS — N039 Chronic nephritic syndrome with unspecified morphologic changes: Secondary | ICD-10-CM

## 2011-08-29 HISTORY — PX: COLONOSCOPY: SHX5424

## 2011-08-29 LAB — CBC
HCT: 30 % — ABNORMAL LOW (ref 39.0–52.0)
Hemoglobin: 9 g/dL — ABNORMAL LOW (ref 13.0–17.0)
MCH: 27.4 pg (ref 26.0–34.0)
MCHC: 30 g/dL (ref 30.0–36.0)
MCV: 91.2 fL (ref 78.0–100.0)
Platelets: 174 10*3/uL (ref 150–400)
RBC: 3.29 MIL/uL — ABNORMAL LOW (ref 4.22–5.81)
RDW: 20.5 % — ABNORMAL HIGH (ref 11.5–15.5)
WBC: 9.3 10*3/uL (ref 4.0–10.5)

## 2011-08-29 LAB — GLUCOSE, CAPILLARY
Glucose-Capillary: 375 mg/dL — ABNORMAL HIGH (ref 70–99)
Glucose-Capillary: 368 mg/dL — ABNORMAL HIGH (ref 70–99)
Glucose-Capillary: 241 mg/dL — ABNORMAL HIGH (ref 70–99)
Glucose-Capillary: 200 mg/dL — ABNORMAL HIGH (ref 70–99)
Glucose-Capillary: 190 mg/dL — ABNORMAL HIGH (ref 70–99)
Glucose-Capillary: 240 mg/dL — ABNORMAL HIGH (ref 70–99)
Glucose-Capillary: 233 mg/dL — ABNORMAL HIGH (ref 70–99)

## 2011-08-29 LAB — RENAL FUNCTION PANEL
Albumin: 2.1 g/dL — ABNORMAL LOW (ref 3.5–5.2)
BUN: 31 mg/dL — ABNORMAL HIGH (ref 6–23)
CO2: 25 mEq/L (ref 19–32)
Calcium: 8.6 mg/dL (ref 8.4–10.5)
Chloride: 96 mEq/L (ref 96–112)
Creatinine, Ser: 3.51 mg/dL — ABNORMAL HIGH (ref 0.50–1.35)
GFR calc Af Amer: 23 mL/min — ABNORMAL LOW (ref >90)
GFR calc non Af Amer: 20 mL/min — ABNORMAL LOW (ref >90)
Glucose, Bld: 371 mg/dL — ABNORMAL HIGH (ref 70–99)
Phosphorus: 4.5 mg/dL (ref 2.3–4.6)
Potassium: 5.3 mEq/L — ABNORMAL HIGH (ref 3.5–5.1)
Sodium: 131 mEq/L — ABNORMAL LOW (ref 135–145)

## 2011-08-29 SURGERY — COLONOSCOPY
Anesthesia: Moderate Sedation

## 2011-08-29 MED ORDER — SODIUM CHLORIDE 0.9 % IV SOLN
125.0000 mg | INTRAVENOUS | Status: DC
  Administered 2011-08-30 – 2011-09-06 (×2): 125 mg via INTRAVENOUS
  Filled 2011-08-29 (×3): qty 10

## 2011-08-29 MED ORDER — FENTANYL CITRATE 0.05 MG/ML IJ SOLN
INTRAMUSCULAR | Status: AC
  Filled 2011-08-29: qty 4

## 2011-08-29 MED ORDER — CALCIUM ACETATE 667 MG PO CAPS
1334.0000 mg | ORAL_CAPSULE | Freq: Three times a day (TID) | ORAL | Status: DC
  Administered 2011-08-29 – 2011-09-02 (×9): 1334 mg via ORAL
  Filled 2011-08-29 (×15): qty 2

## 2011-08-29 MED ORDER — FENTANYL NICU IV SYRINGE 50 MCG/ML
INJECTION | INTRAMUSCULAR | Status: DC | PRN
  Administered 2011-08-29 (×4): 25 ug via INTRAVENOUS

## 2011-08-29 MED ORDER — INSULIN GLARGINE 100 UNIT/ML ~~LOC~~ SOLN
25.0000 [IU] | Freq: Every day | SUBCUTANEOUS | Status: DC
  Administered 2011-08-29 – 2011-09-04 (×7): 25 [IU] via SUBCUTANEOUS

## 2011-08-29 MED ORDER — HEPARIN SODIUM (PORCINE) 1000 UNIT/ML DIALYSIS
20.0000 [IU]/kg | INTRAMUSCULAR | Status: DC | PRN
  Filled 2011-08-29: qty 3

## 2011-08-29 MED ORDER — PARICALCITOL 5 MCG/ML IV SOLN
2.0000 ug | INTRAVENOUS | Status: DC
  Administered 2011-08-30 – 2011-09-03 (×3): 2 ug via INTRAVENOUS
  Filled 2011-08-29 (×4): qty 0.4

## 2011-08-29 MED ORDER — RENA-VITE PO TABS
1.0000 | ORAL_TABLET | Freq: Every day | ORAL | Status: DC
  Administered 2011-08-29 – 2011-09-06 (×9): 1 via ORAL
  Filled 2011-08-29 (×9): qty 1

## 2011-08-29 MED ORDER — DIPHENHYDRAMINE HCL 50 MG/ML IJ SOLN
INTRAMUSCULAR | Status: AC
  Filled 2011-08-29: qty 1

## 2011-08-29 MED ORDER — MIDAZOLAM HCL 10 MG/2ML IJ SOLN
INTRAMUSCULAR | Status: AC
  Filled 2011-08-29: qty 4

## 2011-08-29 MED ORDER — MIDAZOLAM HCL 5 MG/5ML IJ SOLN
INTRAMUSCULAR | Status: DC | PRN
  Administered 2011-08-29 (×3): 2 mg via INTRAVENOUS
  Administered 2011-08-29: 1 mg via INTRAVENOUS
  Administered 2011-08-29: 2 mg via INTRAVENOUS

## 2011-08-29 MED ORDER — POLYETHYLENE GLYCOL 3350 17 G PO PACK
17.0000 g | PACK | Freq: Two times a day (BID) | ORAL | Status: DC
  Administered 2011-08-29 – 2011-09-06 (×8): 17 g via ORAL
  Filled 2011-08-29 (×19): qty 1

## 2011-08-29 MED ORDER — DIPHENHYDRAMINE HCL 50 MG/ML IJ SOLN
12.5000 mg | Freq: Four times a day (QID) | INTRAMUSCULAR | Status: DC | PRN
  Administered 2011-08-29 – 2011-09-06 (×17): 12.5 mg via INTRAVENOUS
  Filled 2011-08-29 (×15): qty 1

## 2011-08-29 MED ORDER — BOOST / RESOURCE BREEZE PO LIQD
1.0000 | Freq: Three times a day (TID) | ORAL | Status: DC
  Administered 2011-08-30 – 2011-09-05 (×10): 1 via ORAL

## 2011-08-29 MED ORDER — SODIUM CHLORIDE 0.9 % IV SOLN
Freq: Once | INTRAVENOUS | Status: AC
  Administered 2011-08-29: 500 mL via INTRAVENOUS

## 2011-08-29 MED FILL — Sincalide For Inj 5 MCG: INTRAMUSCULAR | Qty: 5 | Status: AC

## 2011-08-29 NOTE — Progress Notes (Addendum)
[facility] Progress Note  Subjective:  To have a colonoscopy today and thinks he is having another Echo and maybe another test related to his gall bladder. Mild nausea. Reports abdominal pain is all over, but mostly in lower part  Objective Filed Vitals:   08/29/11 0210 08/29/11 0500 08/29/11 0507 08/29/11 0916  BP: 132/57  117/59 112/60  Pulse: 86  80 75  Temp: 98.8 F (37.1 C)  98.4 F (36.9 C) 97.5 F (36.4 C)  TempSrc: Oral  Oral Oral  Resp: 18  18 18   Height:  5\' 8"  (1.727 m)    Weight:      SpO2: 98%  95% 98%   Physical Exam General: pale, NAD Heart: RRR no rub Lungs: no rales or wheezes Abdomen: very obese moderately distended; + BS; mild tenderness - mostly lower mid (but just had pain med) Extremities: right LE + edema; atrophic; right arm mild edema Dialysis Access: right I-J; right upper AVF patent  Assessment/Plan: 1. Pericardial effusion - incidental finding during screening echo for possible surgery; no evidence of tamponade; not present 3/30;   3. Abdominal pain/Constipation- acute cholecystitis per abdominal CT but HIDA scan negworkup in progress with GI to do colonoscopy today  4. ESRD/s/p failed transplant - TTS SWAF; K slight increase today, but sugar also increased slightly; on cyclosporine 75 bid and prednisone 5; had Golytely prep today. 5. Anemia - Hgb declining now 9; was on 28K Epo when admitted 4/30 and was does 200 q Tuesday of Aranesp  (now on 25 q Thursday) - may need to increase ESA; ad weekly IV Fe; Fe was 19 on 4/30 with 18% sat and ferritin 872 (note ferritin 2190 4/30); follow trend 6. Secondary hyperparathyroidism - resume Zemplar @ 2 mcg q HD; resume binders when off CL- parameters written; follow ca/phos  7. HTN/volume - controlled on meds (Labetalol) continued UF with HD ? Decrease labetolol for more fluid removal and avoidance of BP drop on HD; has extra volume on exam 8. Nutrition - albumin 2.0; on CL add resource (^ protein  CL supplement)  9. DM/gastroparesis- (has insulin pump at home); CBG and SSI while inpt; HgbA1c 6.8%  10. Venous/Vascular insufficiency- wound care to see  11. Hypothyroidism- on synthroid; TSH 5.48 on adm; 12. OSA/Obesity- wears CPAP; wife to bring  55. Diabetes - per primary  Additional Objective Labs: Basic Metabolic Panel:  Lab 0000000 0545 08/28/11 0630 08/27/11 0840 08/27/11 0820  NA 131* 135 137 --  K 5.3* 5.0 3.4* --  CL 96 100 97 --  CO2 25 25 -- 30  GLUCOSE 371* 147* 159* --  BUN 31* 7 17 --  CREATININE 3.51* 2.06* 3.00* --  CALCIUM 8.6 8.4 -- 9.0  ALB -- -- -- --  PHOS 4.5 -- -- --   Liver Function Tests:  Lab 08/29/11 0545 08/28/11 0630 08/27/11 0820  AST -- 15 13  ALT -- 8 9  ALKPHOS -- 207* 197*  BILITOT -- 0.6 0.5  PROT -- 7.3 6.9  ALBUMIN 2.1* 2.0* 1.9*    Lab 08/27/11 0820  LIPASE 26  AMYLASE --  CBC:  Lab 08/29/11 0545 08/28/11 0630 08/27/11 0840 08/27/11 0820 08/25/11 0730 08/24/11 0500  WBC 9.3 6.9 -- 6.8 -- --  NEUTROABS -- -- -- 3.7 -- --  HGB 9.0* 10.1* 10.2* -- -- --  HCT 30.0* 34.4* 30.0* -- -- --  MCV 91.2 92.5 -- 91.8 92.4 91.7  PLT 174 135* -- 160 -- --  CBG:  Lab 08/29/11 0743 08/29/11 0205 08/28/11 2258 08/28/11 2003 08/28/11 1818  GLUCAP 368* 375* 406* 414* 359*  Studies/Results: Ct Abdomen Pelvis Wo Contrast  08/27/2011  *RADIOLOGY REPORT*  Clinical Data: Worsening bilateral lower quadrant abdominal pain. Surgical history includes recent left superficial inguinal lymph node resection, appendectomy, renal transplant, and hernia repair.  CT ABDOMEN AND PELVIS WITHOUT CONTRAST 08/27/2011:  Technique:  Multidetector CT imaging of the abdomen and pelvis was performed following the standard protocol without intravenous contrast.  Comparison: Unenhanced CT abdomen and pelvis 08/22/2011, 08/04/2011.  Findings: Interval slight increase in size of the fluid collection in the left superficial inguinal region (overlying the common femoral artery  and vein, not within the left inguinal canal), current measurements approximating 3.9 x 3.9 cm (series 2, image 97), previously 4.1 x 3.3 cm.  The fluid collection has a Hounsfield measurement of 6, indicating simple fluid.  On the prior examination, gas bubbles were present within the collection, and this is in the area of the recent lymph node resection.  Interval development of thickening of the wall of the gallbladder, with edema/inflammation surrounding the gallbladder.  No biliary ductal dilation.  Normal low dose unenhanced appearance of the liver, spleen, and adrenal glands.  Stable mild pancreatic atrophy. Atrophic native kidneys consistent with end-stage renal disease, without focal parenchymal abnormality.  Moderate to severe aorto- iliofemoral and visceral artery atherosclerosis without aneurysm.  Lymphadenopathy in the retroperitoneum of the abdomen and both sides of the pelvis, unchanged.  Bilateral superficial inguinal lymphadenopathy, unchanged.  No new or enlarging lymphadenopathy. Index left retroperitoneal node approximately 3-4 cm above the aortic bifurcation measures approximately 2.3 x 1.7 cm (series 2, image 46).  Index right retrocrural lymph node is stable at 1.1 cm (series 2, image 19).  Stomach and small bowel normal in appearance.  Distal descending and sigmoid colon diverticulosis without evidence of acute diverticulitis.  Remainder of the colon unremarkable.  Apparent wall thickening in the cecum is felt to be related to decompression.  Lipoma again noted involving the ileocecal valve. No ascites.  Satisfactory appearance of the renal transplant in the left side of the pelvis, without evidence of hydronephrosis or focal parenchymal abnormality.  Urinary bladder unremarkable.  Prostate gland and seminal vesicles normal.  No evidence of recurrent right inguinal hernia.  Diffuse body wall edema.  Bone window images demonstrate degenerative changes involving the lower thoracic and upper  lumbar spine.  Interval increase in size of the now large pericardial effusion. Interval increase in size of a small bilateral pleural effusions. Associated mild passive atelectasis in the lower lobes.  IMPRESSION:  1.  Interval slight increase in size of the postoperative seroma or liquefied hematoma in the left superficial inguinal region at the site of prior lymph node resection. 2.  Stable retroperitoneal and bilateral superficial inguinal lymphadenopathy. 3.  CT findings consistent with acute cholecystitis, new. 4.  Interval increase in size of a now large pericardial effusion. 5.  Interval increase in size of small bilateral pleural effusions. Associated passive atelectasis in the lower lobes, left greater than right. 6.  Distal descending and sigmoid colon diverticulosis without evidence of acute diverticulitis. 7.  Satisfactory unenhanced appearance of the renal transplant in the left side of the pelvis.  These results were called by telephone on 08/27/2011  at  1210 hours to  Dr. Maryan Rued of the emergency department, who verbally acknowledged these results.  Original Report Authenticated By: Deniece Portela, M.D.   Nm Hepatobiliary Liver Func  08/28/2011  *RADIOLOGY REPORT*  Clinical data: Abdominal pain.  Gallbladder wall thickening on ultrasound.  HEPATOBILIARY SCINTIGRAPHY WITH EJECTION FRACTION  Anterior imaging after5.41mCi Tc99M Choletec IV. There is prompt clearance of the radiopharmaceutical from the blood pool. Timely visualization of activity in central bile ducts, small bowel, and gallbladder. After 1 hour,2.3 mcg CCK was infused intravenously and imaging continued. The patient describes no pain with infusion. The calculated gallbladder ejection fraction over 30 minutes is 86% (normal >30% at  thirty minutes [facility] et al., 1992 J. Nuclear Med).  IMPRESSION 1. Patency of cystic and common bile ducts. 2. Normal gallbladder ejection fraction.  Original Report Authenticated By: Trecia Rogers, M.D.   US Abdomen Complete  08/27/2011  *RADIOLOGY REPORT*  Clinical Data:  Right upper quadrant pain, nausea; history of kidney transplant  ABDOMINAL ULTRASOUND COMPLETE  Comparison:  None.  Findings:  Gallbladder:   No gallstones are identified at but there is gallbladder wall thickening and pericholecystic fluid.  Common Bile Duct:  Within normal limits in caliber. Measures 3 mm.  Liver: No focal mass lesion identified.  Within normal limits in parenchymal echogenicity.  IVC:  Appears normal.  Pancreas:  No abnormality identified.  Spleen:  Within normal limits in size and echotexture.  Right kidney:  Atrophic. No evidence of mass or hydronephrosis.  Left kidney: Atrophic.  No evidence of mass or hydronephrosis.  The left lower quadrant transplanted kidney is not imaged.  Abdominal Aorta:  No aneurysm identified.  IMPRESSION: Acalculous cholecystitis with gallbladder wall thickening and pericholecystic fluid.  Original Report Authenticated By: Duayne Cal, M.D.   Medications:      . aspirin  81 mg Oral Daily  . cycloSPORINE modified  75 mg Oral BID  . darbepoetin (ARANESP) injection - DIALYSIS  25 mcg Intravenous Q Thu-HD  . insulin aspart  0-5 Units Subcutaneous QHS  . insulin aspart  0-9 Units Subcutaneous TID WC  . insulin aspart  10 Units Subcutaneous Once  . labetalol  300 mg Oral BID  . levothyroxine  75 mcg Oral QAC breakfast  . piperacillin-tazobactam (ZOSYN)  IV  2.25 g Intravenous Q8H  . polyethylene glycol-electrolytes  4,000 mL Oral Once  . predniSONE  5 mg Oral Q breakfast  . DISCONTD: aspirin  81 mg Oral QPM  . DISCONTD: cycloSPORINE  75 mg Oral BID  . DISCONTD: hydrocortisone sod succinate (SOLU-CORTEF) injection  50 mg Intravenous Q8H    I  have reviewed scheduled and prn medications.  Myriam Jacobson, PA-C [place] [phone]  08/29/2011,9:45 AM  LOS: 2 days   Agree with assessment as outlined above.  Pt back from colonoscopy -  has some abnormal mucosal changes from cecum to sigmoid with question of mild ischemic injury. GI recs noted. He will have regular dialysis tomorrow. Lular Letson B,MD 08/29/2011 1:46 PM

## 2011-08-29 NOTE — Progress Notes (Signed)
I have seen and examined this patient and agree with the assessment/plan as outlined above by Selinda Flavin NP. Densel Kronick K.,MD 08/29/2011 6:36 AM

## 2011-08-29 NOTE — Progress Notes (Signed)
Colonoscopy reviewed - purplish discoloration of entire colon. bx P.  Still with b/l lower abd pain.  Not convinced that a cholecystectomy will ameliorate his abdominal pain. Moreover, I believe risks outweigh current benefits.  rec awaiting colon bx. Likely low flow state.   Leighton Ruff. Redmond Pulling, MD, FACS General, Bariatric, & Minimally Invasive Surgery [facility], [place]

## 2011-08-29 NOTE — Progress Notes (Signed)
Patient c/o severe itching over entire body.  He is requesting IV Benadryl.  Called [facility] and rec'd order for IV Benadryl 12.5 mg.  Medication administered.  Will continue to monitor patient.  Linus Galas 08/29/2011

## 2011-08-29 NOTE — Clinical Documentation Improvement (Signed)
CHF DOCUMENTATION CLARIFICATION QUERY  THIS DOCUMENT IS NOT A PERMANENT PART OF THE MEDICAL RECORD  TO RESPOND TO THE THIS QUERY, FOLLOW THE INSTRUCTIONS BELOW:  1. If needed, update documentation for the patient's encounter via the notes activity.  2. Access this query again and click edit on the In [employer].  3. After updating, or not, click F2 to complete all highlighted (required) fields concerning your review. Select "additional documentation in the medical record" OR "no additional documentation provided".  4. Click Sign note button.  5. The deficiency will fall out of your In Basket *Please let us know if you are not able to complete this workflow by phone or e-mail (listed below).  Please update your documentation within the medical record to reflect your response to this query.                                                                                    08/29/11  Dear Dr. Melrose Nakayama / Associates,  In a better effort to capture your patient's severity of illness, reflect appropriate length of stay and utilization of resources, a review of the patient medical record has revealed the following indicators the diagnosis of Heart Failure.    Based on your clinical judgment, please clarify TYPE & ACUITY and document in a progress note and/or discharge summary the clinical condition associated with the following supporting information:  In responding to this query please exercise your independent judgment.  The fact that a query is asked, does not imply that any particular answer is desired or expected.  Possible Clinical Conditions?  Acute Systolic Congestive Heart Failure Acute Diastolic Congestive Heart Failure Acute Systolic & Diastolic Congestive Heart Failure Acute on Chronic Systolic Congestive Heart Failure Acute on Chronic Diastolic Congestive Heart Failure Acute on Chronic Systolic & Diastolic  Congestive Heart Failure Chronic Systolic Congestive Heart  Failure Chronic Diastolic Congestive Heart Failure Chronic Systolic & Diastolic Congestive Heart Failure Other Condition Cannot Clinically Determine  Supporting Information:   Signs & Symptoms: (As per H&P) =" CHF (congestive heart failure)/pericardial effusion" CT scan showed increasing size of pericardial effusion currently large. Patient is hemodynamically stable. Will order for 2-D echocardiogram . Monitor on telemetry. Dr. Stanford Breed with [facility] was consulted , he will kindly read  stat echocardiogram and if pericardial effusion is confirmed to evaluate the patient for further management and perioperative evaluation (As per notes) "CHF (congestive heart failure)/pericardial effusion "  -Appreciated cardiology input. Echocardiogram showed moderate pericardial effusion , plans to repeat echocardiogram on Tuesday to reassess. 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   Reviewed: additional documentation in the medical record  Thank Dennis Bast  Wallie Char [facility] RN,BSN Clinical Documentation Specialist: [phone] Pager [place]

## 2011-08-29 NOTE — Progress Notes (Signed)
Subjective: Patient seen complaining of persistent mostly lower abdominal pain  Objective: Vital signs in last 24 hours: Temp:  [97.3 F (36.3 C)-98.8 F (37.1 C)] 97.8 F (36.6 C) (05/06 1400) Pulse Rate:  [74-96] 74  (05/06 1400) Resp:  [11-25] 19  (05/06 1400) BP: (90-155)/(54-76) 126/57 mmHg (05/06 1400) SpO2:  [87 %-100 %] 97 % (05/06 1400) Weight:  [115 kg (253 lb 8.5 oz)] 115 kg (253 lb 8.5 oz) (05/05 2045) Weight change: -2.2 kg (-4 lb 13.6 oz) Last BM Date: 08/29/11  Intake/Output from previous day: 05/05 0701 - 05/06 0700 In: 1630 [P.O.:360; I.V.:20; IV Piggyback:50] Out: -  Total I/O In: 165 [P.O.:90; I.V.:75] Out: 2 [Stool:2]   Physical Exam:  General: Alert, awake, oriented x3, in no acute distress.  Heart: Regular rate and rhythm, without murmurs, rubs, gallops.  Lungs: Clear to auscultation bilaterally.  Abdomen: Distended, diffuse mild tenderness without guarding or rebound. positive bowel sounds.  Extremities: Left BKA, right lower leg erythema which seems to be chronic, 2 areas of skin abrasions without drainage.  Neuro: Grossly intact, nonfocal.    Lab Results: Results for orders placed during the hospital encounter of 08/27/11 (from the past 24 hour(s))  GLUCOSE, CAPILLARY     Status: Abnormal   Collection Time   08/28/11  4:24 PM      Component Value Range   Glucose-Capillary 298 (*) 70 - 99 (mg/dL)  GLUCOSE, CAPILLARY     Status: Abnormal   Collection Time   08/28/11  6:18 PM      Component Value Range   Glucose-Capillary 359 (*) 70 - 99 (mg/dL)  GLUCOSE, CAPILLARY     Status: Abnormal   Collection Time   08/28/11  8:03 PM      Component Value Range   Glucose-Capillary 414 (*) 70 - 99 (mg/dL)   Comment 1 Notify RN    GLUCOSE, CAPILLARY     Status: Abnormal   Collection Time   08/28/11 10:58 PM      Component Value Range   Glucose-Capillary 406 (*) 70 - 99 (mg/dL)   Comment 1 Notify RN    GLUCOSE, CAPILLARY     Status: Abnormal   Collection  Time   08/29/11  2:05 AM      Component Value Range   Glucose-Capillary 375 (*) 70 - 99 (mg/dL)   Comment 1 Notify RN    RENAL FUNCTION PANEL     Status: Abnormal   Collection Time   08/29/11  5:45 AM      Component Value Range   Sodium 131 (*) 135 - 145 (mEq/L)   Potassium 5.3 (*) 3.5 - 5.1 (mEq/L)   Chloride 96  96 - 112 (mEq/L)   CO2 25  19 - 32 (mEq/L)   Glucose, Bld 371 (*) 70 - 99 (mg/dL)   BUN 31 (*) 6 - 23 (mg/dL)   Creatinine, Ser 3.51 (*) 0.50 - 1.35 (mg/dL)   Calcium 8.6  8.4 - 10.5 (mg/dL)   Phosphorus 4.5  2.3 - 4.6 (mg/dL)   Albumin 2.1 (*) 3.5 - 5.2 (g/dL)   GFR calc non Af Amer 20 (*) >90 (mL/min)   GFR calc Af Amer 23 (*) >90 (mL/min)  CBC     Status: Abnormal   Collection Time   08/29/11  5:45 AM      Component Value Range   WBC 9.3  4.0 - 10.5 (K/uL)   RBC 3.29 (*) 4.22 - 5.81 (MIL/uL)   Hemoglobin 9.0 (*)  13.0 - 17.0 (g/dL)   HCT 30.0 (*) 39.0 - 52.0 (%)   MCV 91.2  78.0 - 100.0 (fL)   MCH 27.4  26.0 - 34.0 (pg)   MCHC 30.0  30.0 - 36.0 (g/dL)   RDW 20.5 (*) 11.5 - 15.5 (%)   Platelets 174  150 - 400 (K/uL)  GLUCOSE, CAPILLARY     Status: Abnormal   Collection Time   08/29/11  7:43 AM      Component Value Range   Glucose-Capillary 368 (*) 70 - 99 (mg/dL)  GLUCOSE, CAPILLARY     Status: Abnormal   Collection Time   08/29/11 11:02 AM      Component Value Range   Glucose-Capillary 241 (*) 70 - 99 (mg/dL)  GLUCOSE, CAPILLARY     Status: Abnormal   Collection Time   08/29/11 11:51 AM      Component Value Range   Glucose-Capillary 200 (*) 70 - 99 (mg/dL)   Comment 1 Documented in Chart      Studies/Results: Nm Hepatobiliary Liver Func  08/28/2011  *RADIOLOGY REPORT*  Clinical data: Abdominal pain.  Gallbladder wall thickening on ultrasound.  HEPATOBILIARY SCINTIGRAPHY WITH EJECTION FRACTION  Anterior imaging after5.77mCi Tc99M Choletec IV. There is prompt clearance of the radiopharmaceutical from the blood pool. Timely visualization of activity in central bile  ducts, small bowel, and gallbladder. After 1 hour,2.3 mcg CCK was infused intravenously and imaging continued. The patient describes no pain with infusion. The calculated gallbladder ejection fraction over 30 minutes is 86% (normal >30% at  thirty minutes [facility] et al., 1992 J. Nuclear Med).  IMPRESSION 1. Patency of cystic and common bile ducts. 2. Normal gallbladder ejection fraction.  Original Report Authenticated By: Trecia Rogers, M.D.    Medications:    . sodium chloride   Intravenous Once  . aspirin  81 mg Oral Daily  . calcium acetate  1,334 mg Oral TID WC  . cycloSPORINE modified  75 mg Oral BID  . darbepoetin (ARANESP) injection - DIALYSIS  25 mcg Intravenous Q Thu-HD  . feeding supplement  1 Container Oral TID BM  . ferric gluconate (FERRLECIT/NULECIT) IV  125 mg Intravenous Weekly  . insulin aspart  0-5 Units Subcutaneous QHS  . insulin aspart  0-9 Units Subcutaneous TID WC  . insulin aspart  10 Units Subcutaneous Once  . insulin glargine  25 Units Subcutaneous Daily  . labetalol  300 mg Oral BID  . levothyroxine  75 mcg Oral QAC breakfast  . multivitamin  1 tablet Oral Daily  . paricalcitol  2 mcg Intravenous Q T,Th,Sa-HD  . piperacillin-tazobactam (ZOSYN)  IV  2.25 g Intravenous Q8H  . polyethylene glycol  17 g Oral BID  . polyethylene glycol-electrolytes  4,000 mL Oral Once  . predniSONE  5 mg Oral Q breakfast    sodium chloride, sodium chloride, albuterol, bisacodyl, diphenhydrAMINE, heparin, HYDROmorphone (DILAUDID) injection, lidocaine, lidocaine-prilocaine, ondansetron (ZOFRAN) IV, ondansetron, pentafluoroprop-tetrafluoroeth, DISCONTD: fentaNYL, DISCONTD: heparin, DISCONTD: midazolam     Assessment/Plan: Principal Problem:  *Abdominal pain/suspected Cholecystitis, acute  -HIDA scan is negative, etiology of his abdominal pain is currently unclear.  -appreciated GI input ,patient underwent colonscopy today showed mild diverticulosis and abnormal mucosa  suggestive of possible mild ischemic injury, biopsies were sent. Recommendations to continue with aggressive bowel regimen -Empirically on Zosyn, we'll discontinue since no evidence of acute cholecystitis noted. Active Problems:  ESRD (end stage renal disease)  Renal serviceis following. Cont dialysis TTS. History of renal transplant  Cont  prednisone 5 mg and cyclosporin 75 mg (dose recently decreased). Diabetes mellitus  Patient was on insulin pump as outpatient which is currently turned off, CBGs running high him a we'll follow diabetic coordinator recommendations and start Lantus 25 units daily ,with sensitive SSI and monitor CBGs .  Hypertension:  controlled, continue labetalol.  Obstructive sleep apnea  Continue CPAP  ACUTE DIASTOLIC CHF (congestive heart failure)/pericardial effusion  -Appreciated cardiology input. Echocardiogram showed moderate pericardial effusion , plans to repeat echocardiogram on morrow to reassess. Etiology is unclear, Patient with a known history of hypothyroidism, TSH is elevated (see below)  Unlikely uremic pericarditis.  Hypothyroidism:  -Continue Synthroid, elevated TSH but normal  free T4 level will keep on current dose of synthroid .  Anemia  Aranesp as per renal  Right lower extremity ulcer/chronic erythema:  Wound care consulted. S/P BKA (below knee amputation)  CODE STATUS: Full code for now however stated that he he does not wish to be on prolonged life support.          LOS: 2 days   Athenia Rys 08/29/2011, 3:06 PM

## 2011-08-29 NOTE — Progress Notes (Signed)
Patient admitted with abdominal pain. Has history of Type 1 Diabetes. Was diagnosed at age 46 and is completely insulin deficient. Normally wears insulin pump to control his CBGs. Sees Dr. Alton Revere (endocrinologist) in [place].   Upon interview, patient told me he would prefer to not use his insulin pump while in the hospital. Patient told me he would rather have the physicians and nurses regulate his CBGs with Lantus and Novolog. Patient attributes his current hyperglycemia to the fact that his insulin pump is off and he is not getting any long-acting (basal) insulin.  Patient currently not wearing insulin pump.  Review of patient's insulin pump:  Basal= 37.5 units total basal insulin per 24 hour period  Carbohydrate coverage= 1 unit for every 7 grams carbohydrates consumed  Correction factor= 1 unit for every 20 mg/dl above his goal CBG   Spoke with Dr. Melrose Nakayama via phone. Recommend we start 2/3 of his basal insulin rate as Lantus. Lantus 25 units now and then daily. Recommend a lower dose of basal insulin at this point due to the fact that he has a history of hypoglycemia while NPO.  Orders entered and RN (Kami) made aware of new orders.  May also want to change Novolog Sensitive SSI to Q4 hours if patient continues to have abdominal pain and poor PO intake.  Will follow. Wyn Quaker RN, MSN, CDE Diabetes Coordinator [facility]patient [facility] [phone]

## 2011-08-29 NOTE — Progress Notes (Signed)
Utilization review completed. Lizabeth Leyden RN, CCM

## 2011-08-29 NOTE — Progress Notes (Signed)
@   Subjective:  No chest pain; mild dyspnea; persistent lower abdominal pain.   Objective:  Filed Vitals:   08/28/11 2045 08/29/11 0210 08/29/11 0500 08/29/11 0507  BP: 133/54 132/57  117/59  Pulse: 96 86  80  Temp: 98.1 F (36.7 C) 98.8 F (37.1 C)  98.4 F (36.9 C)  TempSrc: Oral Oral  Oral  Resp: 18 18  18   Height:   5\' 8"  (1.727 m)   Weight: 115 kg (253 lb 8.5 oz)     SpO2: 99% 98%  95%    Intake/Output from previous day:  Intake/Output Summary (Last 24 hours) at 08/29/11 0741 Last data filed at 08/28/11 2300  Gross per 24 hour  Intake   1630 ml  Output      0 ml  Net   1630 ml    Physical Exam: Physical exam: Well-developed well-nourished in no acute distress.  Skin is warm and dry.  HEENT is normal.  Neck is supple. JVD difficult to assess Chest is clear to auscultation with normal expansion.  Cardiovascular exam is regular rate and rhythm.  Abdominal exam tender to palpation particularly in low abdomen; abdominal wall edema Extremities s/p left AKA; trace edema and chronic skin changes RLE neuro grossly intact    Lab Results: Basic Metabolic Panel:  Basename 08/29/11 0545 08/28/11 0630  NA 131* 135  K 5.3* 5.0  CL 96 100  CO2 25 25  GLUCOSE 371* 147*  BUN 31* 7  CREATININE 3.51* 2.06*  CALCIUM 8.6 8.4  MG -- --  PHOS 4.5 --   CBC:  Basename 08/29/11 0545 08/28/11 0630 08/27/11 0820  WBC 9.3 6.9 --  NEUTROABS -- -- 3.7  HGB 9.0* 10.1* --  HCT 30.0* 34.4* --  MCV 91.2 92.5 --  PLT 174 135* --    Assessment/Plan:  1) Pericardial effusion - clinically no signs of tamponade; repeat echo in AM. Etiology of effusion unclear. Given h/o immunosuppression for renal transplant, may be worthwhile to have ID see patient for recommendations for further eval. Adenopathy recently noted and felt possibly related to lymphoproliferative disease due to immunosuppression. 2) Lower abdominal pain - GI eval in progress. 3) Renal insufficiency-management per  nephrology. 4) PVD-continue ASA; would benefit from a statin long term. After acute illness improves, would favor functional study as outpatient given multiple risk factors and evidence of PVD (high risk for CAD). 5) Mildly elevated TSH-management per primary care.  Kirk Ruths 08/29/2011, 7:41 AM

## 2011-08-29 NOTE — Progress Notes (Signed)
Patient ID: Dale [name], male   DOB: 1965/10/31, 46 y.o.   MRN: [id number]    Subjective: Pt c/o lower abdominal pain that radiates to epigastrium and then to RUQ.  On the toilet drinking his bowel prep.  Objective: Vital signs in last 24 hours: Temp:  [98.1 F (36.7 C)-98.8 F (37.1 C)] 98.4 F (36.9 C) (05/06 0507) Pulse Rate:  [80-96] 80  (05/06 0507) Resp:  [18] 18  (05/06 0507) BP: (117-161)/(54-75) 117/59 mmHg (05/06 0507) SpO2:  [95 %-100 %] 95 % (05/06 0507) Weight:  [253 lb 8.5 oz (115 kg)] 253 lb 8.5 oz (115 kg) (05/05 2045) Last BM Date: 08/25/11  Intake/Output from previous day: 05/05 0701 - 05/06 0700 In: 1630 [P.O.:360; I.V.:20; IV Piggyback:50] Out: -  Intake/Output this shift:    PE: Abd: soft, mild diffuse soreness, +BS, obese  Lab Results:   Basename 08/29/11 0545 08/28/11 0630  WBC 9.3 6.9  HGB 9.0* 10.1*  HCT 30.0* 34.4*  PLT 174 135*   BMET  Basename 08/29/11 0545 08/28/11 0630  NA 131* 135  K 5.3* 5.0  CL 96 100  CO2 25 25  GLUCOSE 371* 147*  BUN 31* 7  CREATININE 3.51* 2.06*  CALCIUM 8.6 8.4   PT/INR No results found for this basename: LABPROT:2,INR:2 in the last 72 hours CMP     Component Value Date/Time   NA 131* 08/29/2011 0545   K 5.3* 08/29/2011 0545   CL 96 08/29/2011 0545   CO2 25 08/29/2011 0545   GLUCOSE 371* 08/29/2011 0545   BUN 31* 08/29/2011 0545   CREATININE 3.51* 08/29/2011 0545   CALCIUM 8.6 08/29/2011 0545   PROT 7.3 08/28/2011 0630   ALBUMIN 2.1* 08/29/2011 0545   AST 15 08/28/2011 0630   ALT 8 08/28/2011 0630   ALKPHOS 207* 08/28/2011 0630   BILITOT 0.6 08/28/2011 0630   GFRNONAA 20* 08/29/2011 0545   GFRAA 23* 08/29/2011 0545   Lipase     Component Value Date/Time   LIPASE 26 08/27/2011 0820       Studies/Results: Ct Abdomen Pelvis Wo Contrast  08/27/2011  *RADIOLOGY REPORT*  Clinical Data: Worsening bilateral lower quadrant abdominal pain. Surgical history includes recent left superficial inguinal lymph node resection,  appendectomy, renal transplant, and hernia repair.  CT ABDOMEN AND PELVIS WITHOUT CONTRAST 08/27/2011:  Technique:  Multidetector CT imaging of the abdomen and pelvis was performed following the standard protocol without intravenous contrast.  Comparison: Unenhanced CT abdomen and pelvis 08/22/2011, 08/04/2011.  Findings: Interval slight increase in size of the fluid collection in the left superficial inguinal region (overlying the common femoral artery and vein, not within the left inguinal canal), current measurements approximating 3.9 x 3.9 cm (series 2, image 97), previously 4.1 x 3.3 cm.  The fluid collection has a Hounsfield measurement of 6, indicating simple fluid.  On the prior examination, gas bubbles were present within the collection, and this is in the area of the recent lymph node resection.  Interval development of thickening of the wall of the gallbladder, with edema/inflammation surrounding the gallbladder.  No biliary ductal dilation.  Normal low dose unenhanced appearance of the liver, spleen, and adrenal glands.  Stable mild pancreatic atrophy. Atrophic native kidneys consistent with end-stage renal disease, without focal parenchymal abnormality.  Moderate to severe aorto- iliofemoral and visceral artery atherosclerosis without aneurysm.  Lymphadenopathy in the retroperitoneum of the abdomen and both sides of the pelvis, unchanged.  Bilateral superficial inguinal lymphadenopathy, unchanged.  No new or enlarging  lymphadenopathy. Index left retroperitoneal node approximately 3-4 cm above the aortic bifurcation measures approximately 2.3 x 1.7 cm (series 2, image 46).  Index right retrocrural lymph node is stable at 1.1 cm (series 2, image 19).  Stomach and small bowel normal in appearance.  Distal descending and sigmoid colon diverticulosis without evidence of acute diverticulitis.  Remainder of the colon unremarkable.  Apparent wall thickening in the cecum is felt to be related to decompression.   Lipoma again noted involving the ileocecal valve. No ascites.  Satisfactory appearance of the renal transplant in the left side of the pelvis, without evidence of hydronephrosis or focal parenchymal abnormality.  Urinary bladder unremarkable.  Prostate gland and seminal vesicles normal.  No evidence of recurrent right inguinal hernia.  Diffuse body wall edema.  Bone window images demonstrate degenerative changes involving the lower thoracic and upper lumbar spine.  Interval increase in size of the now large pericardial effusion. Interval increase in size of a small bilateral pleural effusions. Associated mild passive atelectasis in the lower lobes.  IMPRESSION:  1.  Interval slight increase in size of the postoperative seroma or liquefied hematoma in the left superficial inguinal region at the site of prior lymph node resection. 2.  Stable retroperitoneal and bilateral superficial inguinal lymphadenopathy. 3.  CT findings consistent with acute cholecystitis, new. 4.  Interval increase in size of a now large pericardial effusion. 5.  Interval increase in size of small bilateral pleural effusions. Associated passive atelectasis in the lower lobes, left greater than right. 6.  Distal descending and sigmoid colon diverticulosis without evidence of acute diverticulitis. 7.  Satisfactory unenhanced appearance of the renal transplant in the left side of the pelvis.  These results were called by telephone on 08/27/2011  at  1210 hours to  Dr. Maryan Rued of the emergency department, who verbally acknowledged these results.  Original Report Authenticated By: Deniece Portela, M.D.   Nm Hepatobiliary Liver Func  08/28/2011  *RADIOLOGY REPORT*  Clinical data: Abdominal pain.  Gallbladder wall thickening on ultrasound.  HEPATOBILIARY SCINTIGRAPHY WITH EJECTION FRACTION  Anterior imaging after5.78mCi Tc99M Choletec IV. There is prompt clearance of the radiopharmaceutical from the blood pool. Timely visualization of activity in  central bile ducts, small bowel, and gallbladder. After 1 hour,2.3 mcg CCK was infused intravenously and imaging continued. The patient describes no pain with infusion. The calculated gallbladder ejection fraction over 30 minutes is 86% (normal >30% at  thirty minutes [facility] et al., 1992 J. Nuclear Med).  IMPRESSION 1. Patency of cystic and common bile ducts. 2. Normal gallbladder ejection fraction.  Original Report Authenticated By: Trecia Rogers, M.D.   US Abdomen Complete  08/27/2011  *RADIOLOGY REPORT*  Clinical Data:  Right upper quadrant pain, nausea; history of kidney transplant  ABDOMINAL ULTRASOUND COMPLETE  Comparison:  None.  Findings:  Gallbladder:   No gallstones are identified at but there is gallbladder wall thickening and pericholecystic fluid.  Common Bile Duct:  Within normal limits in caliber. Measures 3 mm.  Liver: No focal mass lesion identified.  Within normal limits in parenchymal echogenicity.  IVC:  Appears normal.  Pancreas:  No abnormality identified.  Spleen:  Within normal limits in size and echotexture.  Right kidney:  Atrophic. No evidence of mass or hydronephrosis.  Left kidney: Atrophic.  No evidence of mass or hydronephrosis.  The left lower quadrant transplanted kidney is not imaged.  Abdominal Aorta:  No aneurysm identified.  IMPRESSION: Acalculous cholecystitis with gallbladder wall thickening and pericholecystic fluid.  Original Report Authenticated By: Duayne Cal, M.D.   Dg Abd Acute W/chest  08/27/2011  *RADIOLOGY REPORT*  Clinical Data: Abdominal pain and shortness of breath.  ACUTE ABDOMEN SERIES (ABDOMEN 2 VIEW & CHEST 1 VIEW)  Comparison: Chest x-ray fourth 32,013.  Findings: The upright chest x-ray demonstrates stable cardiac enlargement.  The right IJ diatek catheter is stable.  There is vascular congestion and mild pulmonary edema with small pleural effusions.  Two views of the abdomen demonstrate an unremarkable bowel gas pattern.  There is scattered  air and stool in the colon.  No distended small bowel loops to suggest obstruction.  No free air. The bony structures are intact.  IMPRESSION:  1.  Cardiac enlargement with vascular congestion, mild interstitial edema and small effusions. 2.  No plain film findings for an acute abdominal process.  Original Report Authenticated By: P. Kalman Jewels, M.D.    Anti-infectives: Anti-infectives     Start     Dose/Rate Route Frequency Ordered Stop   08/27/11 2200  piperacillin-tazobactam (ZOSYN) IVPB 2.25 g       2.25 g 100 mL/hr over 30 Minutes Intravenous 3 times per day 08/27/11 1542     08/27/11 1400  piperacillin-tazobactam (ZOSYN) IVPB 2.25 g       2.25 g 100 mL/hr over 30 Minutes Intravenous  Once 08/27/11 1330 08/27/11 1506           Assessment/Plan  1. Abdominal pain, unknown etiology  Plan: 1. Agree with GI workup to rule out some other cause of his pain besides his gallbladder.  His gb does appear different on his recent scans, but his HIDA is negative for acute cholecystitis.  He could have some component of chronic cholecystitis, but this would be a very odd presentation.  Would let GI do work up to rule out another source for his pain.  We will follow peripherally.   LOS: 2 days    Mikaya Bunner E 08/29/2011

## 2011-08-29 NOTE — Op Note (Signed)
[facility] [place], [place]  [id number]  COLONOSCOPY PROCEDURE REPORT  PATIENT:  Dale [name], Dale [name]  MR#:  [id number] BIRTHDATE:  [birth year], 8 yrs. old  GENDER:  male ENDOSCOPIST:  Lajuan Lines. Makylee Sanborn, MD REF. BY:  [facility], PROCEDURE DATE:  08/29/2011 PROCEDURE:  Colonoscopy with biopsy ASA CLASS:  Class III INDICATIONS:  Lower Abdominal pain, constipation MEDICATIONS:   Fentanyl 100 mcg IV, Versed 9 mg IV  DESCRIPTION OF PROCEDURE:   After the risks benefits and alternatives of the procedure were thoroughly explained, informed consent was obtained.  Digital rectal exam was performed and revealed no rectal masses.   The Pentax Colonoscope [id number] endoscope was introduced through the anus and advanced to the cecum, which was identified by both the appendix and ileocecal valve, without limitations.  The quality of the prep was Moviprep fair.  The instrument was then slowly withdrawn as the colon was fully examined. <<PROCEDUREIMAGES>>  FINDINGS:  Fair prep, particularly in the right colon.  There was some retained solid stool that could not be cleared with lavage. Mild diverticulosis was found in the left colon.  Abnormal appearing mucosa throughout the colon from the cecum to the sigmoid colon.  The mucosa appeared pale and in other areas with purplish discoloration.  Multiple biopsies were obtained from the right and left colon and sent to pathology.   Retroflexed views in the rectum revealed small internal hemorrhoids. The scope was then withdrawn from the cecum and the procedure completed.  COMPLICATIONS:  None ENDOSCOPIC IMPRESSION: 1) Fair prep, particularly in the right colon. 2) Mild diverticulosis in the left colon. 3) Abnormal mucosa (see above) from the cecum to the sigmoid colon.  Biopsies performed, query mild ischemic injury 4) Small internal hemorrhoids  RECOMMENDATIONS: 1) Await pathology results 2) High fiber diet. 3)  Aggressive bowel regimen to prevent constipation. 4) Miralax 17 gram BID for now. May need additional laxatives.  Lajuan Lines. Lovelle Lema, MD  CC:  The Patient  n. eSIGNED:   Lajuan Lines. Deshonda Cryderman at 08/29/2011 12:52 PM  Gillian Scarce, [id number]

## 2011-08-29 NOTE — Care Management Note (Signed)
    Page 1 of 1   08/29/2011     4:56:15 PM   CARE MANAGEMENT NOTE 08/29/2011  Patient:  Dale [name], Dale [name]   Account Number:  [id number]  Date Initiated:  08/29/2011  Documentation initiated by:  Lizabeth Leyden  Subjective/Objective Assessment:   Patient admitted with abdominal pain, acute cholecystitis.     Action/Plan:   Progression of care and discharge planning   Anticipated DC Date:  09/01/2011   Anticipated DC Plan:  [name]         Choice offered to / List presented to:             Status of service:  In process, will continue to follow Medicare Important Message given?   (If response is "NO", the following Medicare IM given date fields will be blank) Date Medicare IM given:   Date Additional Medicare IM given:    Discharge Disposition:    Per UR Regulation:  Reviewed for med. necessity/level of care/duration of stay  If discussed at [place] of Stay Meetings, dates discussed:    Comments:  08/29/11 [name], RN, BSN 1652 PT HAS BEEN READMITTED WITH ABD PAIN AND HAS HAD A COLONOSCOPY TODAY.  PT HAS BEEN DC'D IN THE PAST WITH HH PT WITH [facility].  WILL F/U.  PT AND WIFE NOT HAPPY WITH [facility] AS THEY DIDNT COME OUT TO SEE THE PT IMMEDIATELY AT DC AS THEY TOLD HER THAT THEY WERE BACKED UP AND THE PT HAS BEEN IN AND OUT OF THE HOSPITAL AS [facility] HAS BEEN FOLLOWING UP WITH THEM THROUGH THIS TIME PERIOD.  PT AND HIS WIFE WERE INFORMED THAT THEY CAN CHOOSE ANOTHER AGENCY AT [place].  08/29/2011 [name] [name] Utilization review completed.  Patient recent inpatient 08/22/2011 to 08/26/2011 constipation and  HCAP. Medical history renal transplant, hemodialysis.

## 2011-08-30 ENCOUNTER — Encounter (HOSPITAL_COMMUNITY): Payer: Self-pay | Admitting: Internal Medicine

## 2011-08-30 ENCOUNTER — Inpatient Hospital Stay (HOSPITAL_COMMUNITY): Payer: Medicare Other

## 2011-08-30 DIAGNOSIS — K819 Cholecystitis, unspecified: Secondary | ICD-10-CM

## 2011-08-30 DIAGNOSIS — D631 Anemia in chronic kidney disease: Secondary | ICD-10-CM

## 2011-08-30 DIAGNOSIS — I319 Disease of pericardium, unspecified: Secondary | ICD-10-CM

## 2011-08-30 DIAGNOSIS — I318 Other specified diseases of pericardium: Secondary | ICD-10-CM

## 2011-08-30 DIAGNOSIS — K810 Acute cholecystitis: Secondary | ICD-10-CM

## 2011-08-30 DIAGNOSIS — N039 Chronic nephritic syndrome with unspecified morphologic changes: Secondary | ICD-10-CM

## 2011-08-30 DIAGNOSIS — N186 End stage renal disease: Secondary | ICD-10-CM

## 2011-08-30 LAB — CBC
HCT: 28.9 % — ABNORMAL LOW (ref 39.0–52.0)
Hemoglobin: 8.6 g/dL — ABNORMAL LOW (ref 13.0–17.0)
MCH: 27 pg (ref 26.0–34.0)
MCHC: 29.8 g/dL — ABNORMAL LOW (ref 30.0–36.0)
MCV: 90.6 fL (ref 78.0–100.0)
Platelets: 153 10*3/uL (ref 150–400)
RBC: 3.19 MIL/uL — ABNORMAL LOW (ref 4.22–5.81)
RDW: 20.9 % — ABNORMAL HIGH (ref 11.5–15.5)
WBC: 8.4 10*3/uL (ref 4.0–10.5)

## 2011-08-30 LAB — RENAL FUNCTION PANEL
Albumin: 1.9 g/dL — ABNORMAL LOW (ref 3.5–5.2)
BUN: 35 mg/dL — ABNORMAL HIGH (ref 6–23)
CO2: 24 mEq/L (ref 19–32)
Calcium: 8.5 mg/dL (ref 8.4–10.5)
Chloride: 98 mEq/L (ref 96–112)
Creatinine, Ser: 4.15 mg/dL — ABNORMAL HIGH (ref 0.50–1.35)
GFR calc Af Amer: 18 mL/min — ABNORMAL LOW (ref >90)
GFR calc non Af Amer: 16 mL/min — ABNORMAL LOW (ref >90)
Glucose, Bld: 218 mg/dL — ABNORMAL HIGH (ref 70–99)
Phosphorus: 5.1 mg/dL — ABNORMAL HIGH (ref 2.3–4.6)
Potassium: 4.1 mEq/L (ref 3.5–5.1)
Sodium: 132 mEq/L — ABNORMAL LOW (ref 135–145)

## 2011-08-30 LAB — GLUCOSE, CAPILLARY
Glucose-Capillary: 131 mg/dL — ABNORMAL HIGH (ref 70–99)
Glucose-Capillary: 111 mg/dL — ABNORMAL HIGH (ref 70–99)
Glucose-Capillary: 142 mg/dL — ABNORMAL HIGH (ref 70–99)
Glucose-Capillary: 146 mg/dL — ABNORMAL HIGH (ref 70–99)

## 2011-08-30 MED ORDER — DIPHENHYDRAMINE HCL 50 MG/ML IJ SOLN
INTRAMUSCULAR | Status: AC
  Administered 2011-08-30: 12.5 mg via INTRAVENOUS
  Filled 2011-08-30: qty 1

## 2011-08-30 MED ORDER — LABETALOL HCL 100 MG PO TABS
100.0000 mg | ORAL_TABLET | Freq: Two times a day (BID) | ORAL | Status: DC
  Administered 2011-08-30 – 2011-08-31 (×2): 100 mg via ORAL
  Filled 2011-08-30 (×3): qty 1

## 2011-08-30 MED ORDER — HYDROMORPHONE HCL PF 1 MG/ML IJ SOLN
INTRAMUSCULAR | Status: AC
  Administered 2011-08-30: 1 mg via INTRAVENOUS
  Filled 2011-08-30: qty 1

## 2011-08-30 MED ORDER — LABETALOL HCL 100 MG PO TABS
100.0000 mg | ORAL_TABLET | Freq: Two times a day (BID) | ORAL | Status: DC
  Filled 2011-08-30 (×2): qty 1

## 2011-08-30 MED ORDER — DARBEPOETIN ALFA-POLYSORBATE 100 MCG/0.5ML IJ SOLN
100.0000 ug | INTRAMUSCULAR | Status: DC
  Administered 2011-09-01: 100 ug via INTRAVENOUS
  Filled 2011-08-30: qty 0.5

## 2011-08-30 MED ORDER — PARICALCITOL 5 MCG/ML IV SOLN
INTRAVENOUS | Status: AC
  Administered 2011-08-30: 2 ug via INTRAVENOUS
  Filled 2011-08-30: qty 1

## 2011-08-30 NOTE — Progress Notes (Signed)
Subjective: Patient seen and examined ,he is feeling better with minimal abdominal discomfort  Objective: Vital signs in last 24 hours: Temp:  [97.2 F (36.2 C)-98.9 F (37.2 C)] 97.2 F (36.2 C) (05/07 1043) Pulse Rate:  [74-84] 81  (05/07 1043) Resp:  [11-25] 17  (05/07 1043) BP: (81-146)/(48-94) 109/94 mmHg (05/07 1043) SpO2:  [87 %-100 %] 89 % (05/07 0630) Weight:  [118.7 kg (261 lb 11 oz)-120 kg (264 lb 8.8 oz)] 118.7 kg (261 lb 11 oz) (05/07 1043) Weight change: 5 kg (11 lb 0.4 oz) Last BM Date: 08/29/11  Intake/Output from previous day: 05/06 0701 - 05/07 0700 In: 165 [P.O.:90; I.V.:75] Out: 2 [Stool:2] Total I/O In: -  Out: [id number] [Other:1068]   Physical Exam: General: Alert, awake, oriented x3, in no acute distress.  Heart: Regular rate and rhythm, without murmurs, rubs, gallops.  Lungs: Clear to auscultation bilaterally.  Abdomen: Distended, diffuse mild tenderness without guarding or rebound. positive bowel sounds.  Extremities: Left BKA, right lower leg erythema which seems to be chronic, dressing in place Neuro: Grossly intact, nonfocal.      Lab Results: Results for orders placed during the hospital encounter of 08/27/11 (from the past 24 hour(s))  GLUCOSE, CAPILLARY     Status: Abnormal   Collection Time   08/29/11 11:51 AM      Component Value Range   Glucose-Capillary 200 (*) 70 - 99 (mg/dL)   Comment 1 Documented in Chart    GLUCOSE, CAPILLARY     Status: Abnormal   Collection Time   08/29/11  2:17 PM      Component Value Range   Glucose-Capillary 190 (*) 70 - 99 (mg/dL)  GLUCOSE, CAPILLARY     Status: Abnormal   Collection Time   08/29/11  4:24 PM      Component Value Range   Glucose-Capillary 240 (*) 70 - 99 (mg/dL)   Comment 1 Documented in Chart     Comment 2 Notify RN    GLUCOSE, CAPILLARY     Status: Abnormal   Collection Time   08/29/11  9:27 PM      Component Value Range   Glucose-Capillary 233 (*) 70 - 99 (mg/dL)  CBC     Status: Abnormal    Collection Time   08/30/11  7:20 AM      Component Value Range   WBC 8.4  4.0 - 10.5 (K/uL)   RBC 3.19 (*) 4.22 - 5.81 (MIL/uL)   Hemoglobin 8.6 (*) 13.0 - 17.0 (g/dL)   HCT 28.9 (*) 39.0 - 52.0 (%)   MCV 90.6  78.0 - 100.0 (fL)   MCH 27.0  26.0 - 34.0 (pg)   MCHC 29.8 (*) 30.0 - 36.0 (g/dL)   RDW 20.9 (*) 11.5 - 15.5 (%)   Platelets 153  150 - 400 (K/uL)  RENAL FUNCTION PANEL     Status: Abnormal   Collection Time   08/30/11  7:20 AM      Component Value Range   Sodium 132 (*) 135 - 145 (mEq/L)   Potassium 4.1  3.5 - 5.1 (mEq/L)   Chloride 98  96 - 112 (mEq/L)   CO2 24  19 - 32 (mEq/L)   Glucose, Bld 218 (*) 70 - 99 (mg/dL)   BUN 35 (*) 6 - 23 (mg/dL)   Creatinine, Ser 4.15 (*) 0.50 - 1.35 (mg/dL)   Calcium 8.5  8.4 - 10.5 (mg/dL)   Phosphorus 5.1 (*) 2.3 - 4.6 (mg/dL)   Albumin 1.9 (*)  3.5 - 5.2 (g/dL)   GFR calc non Af Amer 16 (*) >90 (mL/min)   GFR calc Af Amer 18 (*) >90 (mL/min)    Studies/Results: No results found.  Medications:    . sodium chloride   Intravenous Once  . aspirin  81 mg Oral Daily  . calcium acetate  1,334 mg Oral TID WC  . cycloSPORINE modified  75 mg Oral BID  . darbepoetin (ARANESP) injection - DIALYSIS  100 mcg Intravenous Q Thu-HD  . feeding supplement  1 Container Oral TID BM  . ferric gluconate (FERRLECIT/NULECIT) IV  125 mg Intravenous Weekly  . insulin aspart  0-5 Units Subcutaneous QHS  . insulin aspart  0-9 Units Subcutaneous TID WC  . insulin glargine  25 Units Subcutaneous Daily  . labetalol  100 mg Oral BID  . levothyroxine  75 mcg Oral QAC breakfast  . multivitamin  1 tablet Oral Daily  . paricalcitol  2 mcg Intravenous Q T,Th,Sa-HD  . polyethylene glycol  17 g Oral BID  . predniSONE  5 mg Oral Q breakfast  . DISCONTD: darbepoetin (ARANESP) injection - DIALYSIS  25 mcg Intravenous Q Thu-HD  . DISCONTD: labetalol  300 mg Oral BID  . DISCONTD: piperacillin-tazobactam (ZOSYN)  IV  2.25 g Intravenous Q8H    sodium chloride,  sodium chloride, albuterol, bisacodyl, diphenhydrAMINE, heparin, HYDROmorphone (DILAUDID) injection, lidocaine, lidocaine-prilocaine, ondansetron (ZOFRAN) IV, ondansetron, pentafluoroprop-tetrafluoroeth, DISCONTD: fentaNYL, DISCONTD: midazolam     Assessment/Plan:  Principal Problem:  *Abdominal pain CT scan showed findings suggestive of possible acute cholecystitis ,however HIDA scan was negative -patient underwent colonscopy which  showed mild diverticulosis and abnormal mucosa suggestive of possible mild ischemic injury, biopsies were sent. Recommendations to continue with aggressive bowel regimen  -patient was seen by CCS and GI Active Problems:  ESRD (end stage renal disease)  Renal serviceis following. Cont dialysis TTS.  History of renal transplant  Cont prednisone 5 mg and cyclosporin 75 mg (dose recently decreased).  Diabetes mellitus  Patient was on insulin pump as outpatient which is currently turned off, started Lantus 25 units daily  Yesterday, will change  SSI to moderate ,monitor CBGs and adjust PRN . Diabetic coordinator is following Hypertension:  BP is running low ,agree with decrease  Labetalol dose and will also add parameters to hold for SBP<110 ,HR,60.  Obstructive sleep apnea  Continue CPAP  ACUTE DIASTOLIC CHF (congestive heart failure)/pericardial effusion  - Echocardiogram showed moderate pericardial effusion , plans to repeat echocardiogram today to reassess. Etiology is unclear, Patient with a known history of hypothyroidism, TSH is elevated (see below)  Unlikely uremic pericarditis.  Hypothyroidism:  -Continue Synthroid, elevated TSH but normal free T4 level will keep on current dose of synthroid .  Anemia  Aranesp as per renal  Right lower extremity ulcer/chronic erythema:  Wound care follow up.  S/P BKA (below knee amputation)  CODE STATUS: Full code for now however stated that he he does not wish to be on prolonged life support.           LOS:  3 days   Whittany Parish 08/30/2011, 11:24 AM

## 2011-08-30 NOTE — Consult Note (Signed)
Consulted for wounds to lower right extremity.  History of previous stasis ulcerswounds requiring treatment.  Which have healed.  2 wounds: stasis ulcers right lower extremity shin area.  Superior wound measures 1.1 x 0.8 cm, scabbed area.  No evidence of exudate.    Inferior wound measures 0.6 x 0.5 cm.  Partial thickness skin tear with small amount of clear exudate noted.  Hemosiderin staining noted to lower extremity, 3 + pitting edema noted.    Admits to previously being treated with Unnas boots until previous wounds healed.  Currently receiving dialysis.    Plan:  Apply small Allevyn bordered dressing to protect and absorb drainage.   Harriet Pho RN, BSN, [place] Nurse Will not plan to follow further unless re-consulted.  [address] [place], MSN, [place]

## 2011-08-30 NOTE — Progress Notes (Signed)
I agree with the above documentation, including the assessment and plan. Awaiting path results.  Continue with bowel regimen to avoid constipation.

## 2011-08-30 NOTE — Progress Notes (Signed)
@   Subjective:  No chest pain; mild dyspnea; persistent lower abdominal pain, but improved.   Objective:  Filed Vitals:   08/29/11 2126 08/30/11 0442 08/30/11 0638 08/30/11 0643  BP: 133/58 136/61 145/72   Pulse: 80 81 78 79  Temp: 98.9 F (37.2 C) 98.2 F (36.8 C)    TempSrc:  Oral    Resp: 18 18 19 19   Height:      Weight:      SpO2: 96% 96%      Intake/Output from previous day:  Intake/Output Summary (Last 24 hours) at 08/30/11 [id number] Last data filed at 08/29/11 1300  Gross per 24 hour  Intake    165 ml  Output      2 ml  Net    163 ml    Physical Exam: Physical exam: Well-developed well-nourished in no acute distress.  Skin is warm and dry.  HEENT is normal.  Neck is supple. JVD difficult to assess Chest is clear to auscultation with normal expansion.  Cardiovascular exam is regular rate and rhythm.  Abdominal exam tender to palpation particularly in low abdomen, improved; abdominal wall edema Extremities s/p left BKA; trace edema and chronic skin changes RLE neuro grossly intact    Lab Results: Basic Metabolic Panel:  Basename 08/29/11 0545 08/28/11 0630  NA 131* 135  K 5.3* 5.0  CL 96 100  CO2 25 25  GLUCOSE 371* 147*  BUN 31* 7  CREATININE 3.51* 2.06*  CALCIUM 8.6 8.4  MG -- --  PHOS 4.5 --   CBC:  Basename 08/29/11 0545 08/28/11 0630 08/27/11 0820  WBC 9.3 6.9 --  NEUTROABS -- -- 3.7  HGB 9.0* 10.1* --  HCT 30.0* 34.4* --  MCV 91.2 92.5 --  PLT 174 135* --    Assessment/Plan:  1) Pericardial effusion - clinically no signs of tamponade; repeat echo today. Etiology of effusion unclear. Given h/o immunosuppression for renal transplant, would have ID see patient for recommendations for further eval. Adenopathy recently noted and felt possibly related to lymphoproliferative disease due to immunosuppression. 2) Lower abdominal pain - GI eval in progress; abnormal mucosa from cecum to sigmoid; ? Mild ischemic injury, bx pending. 3) Renal  insufficiency-management per nephrology. 4) PVD-continue ASA; would benefit from a statin long term. After acute illness improves, would favor functional study as outpatient given multiple risk factors and evidence of PVD (high risk for CAD). 5) Mildly elevated TSH-management per primary care.  Kirk Ruths 08/30/2011, 7:02 AM

## 2011-08-30 NOTE — Procedures (Signed)
I have personally attended this patient's dialysis session.  Current BP in 70's but pt received dilaudid and benadryl; trying to keep SBP over 100 to avoid issues with possible bowel ischemia given colonoscopy results Kieth Hartis B

## 2011-08-30 NOTE — Progress Notes (Signed)
[facility] Progress Note  Subjective:  Seen on dialysis BP low (down to 70's) as patient received dilaudid for pain Still having some lower abdominal discomfort but better (said cleaning him out with colonoscopy prep made him feel better Objective Filed Vitals:   08/30/11 0442 08/30/11 0630 08/30/11 0638 08/30/11 0643  BP: 136/61 122/82 145/72   Pulse: 81 82 78 79  Temp: 98.2 F (36.8 C) 97.3 F (36.3 C)    TempSrc: Oral Oral    Resp: 18 21 19 19   Height:      Weight:  120 kg (264 lb 8.8 oz)    SpO2: 96% 89%     Physical Exam General:obese WM  Seen on HD Heart:S1S2 No S3; regular; no definite murmur heard; no rub   Lungs:clear (using abdominal muscles but states not dyspneic) Abdomen:obese with some mild LQ tenderness; abd wall hernia; allograft NT; some abd wall edema Extremities:chronic skin changes on LE; BKA Dialysis Access:   Assessment/Plan: 1. Pericardial effusion - incidental finding during screening echo for possible surgery; no evidence of tamponade; not present 3/30; cardiology plans torepeat the 2D echo 3. Abdominal pain/Constipation- acute cholecystitis per abdominal CT but HIDA scan neg; colonoscopy with tics and questionable area of ischemic change possible 4. ESRD/s/p failed transplant - TTS SWAF;on cyclosporine 75 bid and prednisone 5;  5. Anemia - Hgb declining now 8.6; was on 28K Epo when admitted 4/30 and was dosed 200 q Tuesday of Aranesp (now on 25 q Thursday) -  need to increase ESA - have ordered 100 QTHURS; add weekly IV Fe; Fe was 19 on 4/30 with 18% sat and ferritin 872 (note ferritin 2190 4/30); follow trend  6. Secondary hyperparathyroidism - resumed Zemplar @ 2 mcg q HD; resume binders when off CL- parameters written; follow ca/phos  7. HTN/volume - controlled on meds (Labetalol) continued UF with HD  Decrease labetolol from 300 to 100 BID to allow for more fluid removal and avoidance of BP drop on HD; has extra volume on exam and has  already had BP drop today with HD  8. Nutrition - albumin 2.0; on CL add resource (^ protein CL supplement)  9. DM/gastroparesis- (has insulin pump at home); CBG and SSI while inpt; HgbA1c 6.8%  10. Venous/Vascular insufficiency- wound care to see  11. Hypothyroidism- on synthroid; TSH 5.48 on adm;  12. OSA/Obesity- wears CPAP; wife to bring  34. Diabetes - per primary  Additional Objective Labs: Basic Metabolic Panel:  Lab [id number] 0720 08/29/11 0545 08/28/11 0630  NA 132* 131* 135  K 4.1 5.3* 5.0  CL 98 96 100  CO2 24 25 25   GLUCOSE 218* 371* 147*  BUN 35* 31* 7  CREATININE 4.15* 3.51* 2.06*  CALCIUM 8.5 8.6 8.4  ALB -- -- --  PHOS 5.1* 4.5 --   Liver Function Tests:  Lab 08/30/11 0720 08/29/11 0545 08/28/11 0630 08/27/11 0820  AST -- -- 15 13  ALT -- -- 8 9  ALKPHOS -- -- 207* 197*  BILITOT -- -- 0.6 0.5  PROT -- -- 7.3 6.9  ALBUMIN 1.9* 2.1* 2.0* --    Lab 08/27/11 0820  LIPASE 26  AMYLASE --   No results found for this basename: AMMONIA:3 in the last 168 hours INR: No components found with this basename: INR3 CBC:  Lab 08/30/11 0720 08/29/11 0545 08/28/11 0630 08/27/11 0820 08/25/11 0730  WBC 8.4 9.3 6.9 -- --  NEUTROABS -- -- -- 3.7 --  HGB 8.6* 9.0* 10.1* -- --  HCT 28.9* 30.0* 34.4* -- --  MCV 90.6 91.2 92.5 91.8 92.4  PLT 153 174 135* -- --   B  Cardiac Enzymes: No results found for this basename: CKTOTAL:5,CKMB:5,CKMBINDEX:5,TROPONINI:5 in the last 168 hours CBG:  Lab 08/29/11 2127 08/29/11 1624 08/29/11 1417 08/29/11 1151 08/29/11 1102  GLUCAP 233* 240* 190* 200* 241*  Medications:   . sodium chloride   Intravenous Once  . aspirin  81 mg Oral Daily  . calcium acetate  1,334 mg Oral TID WC  . cycloSPORINE modified  75 mg Oral BID  . darbepoetin (ARANESP) injection - DIALYSIS  25 mcg Intravenous Q Thu-HD  . feeding supplement  1 Container Oral TID BM  . ferric gluconate (FERRLECIT/NULECIT) IV  125 mg Intravenous Weekly  . insulin aspart   0-5 Units Subcutaneous QHS  . insulin aspart  0-9 Units Subcutaneous TID WC  . insulin glargine  25 Units Subcutaneous Daily  . labetalol  300 mg Oral BID  . levothyroxine  75 mcg Oral QAC breakfast  . multivitamin  1 tablet Oral Daily  . paricalcitol  2 mcg Intravenous Q T,Th,Sa-HD  . polyethylene glycol  17 g Oral BID  . predniSONE  5 mg Oral Q breakfast  . DISCONTD: piperacillin-tazobactam (ZOSYN)  IV  2.25 g Intravenous Q8H    I  have reviewed scheduled and prn medications.   08/30/2011,7:59 AM  LOS: 3 days

## 2011-08-30 NOTE — Progress Notes (Signed)
  Echocardiogram 2D Echocardiogram (limited) has been performed.  Basilia Jumbo [facility] 08/30/2011, 3:57 PM

## 2011-08-30 NOTE — Progress Notes (Signed)
     Dale [name] Daily Rounding Note 08/30/2011, 9:46 AM  SUBJECTIVE:       Still mild discomfort in lower abdomen bil.  No nausea.  Had BM  OBJECTIVE:        General: obese, ill-looking   Vital signs in last 24 hours:    Temp:  [97.3 F (36.3 C)-98.9 F (37.2 C)] 97.3 F (36.3 C) (05/07 0630) Pulse Rate:  [74-84] 82  (05/07 0930) Resp:  [11-25] 22  (05/07 0930) BP: (81-146)/(48-82) 84/50 mmHg (05/07 0930) SpO2:  [87 %-100 %] 89 % (05/07 0630) Weight:  [264 lb 8.8 oz (120 kg)] 264 lb 8.8 oz (120 kg) (05/07 0630) Last BM Date: 08/29/11  Heart: RRR Chest: clear B.  No sob or cough Abdomen: obese, NT, ND.  Active BS  Extremities: discoloration and sores on righ LE, amputation on left LE Neuro/Psych:  Pleasant.  Not agitated or anxious.   Intake/Output from previous day: 05/06 0701 - 05/07 0700 In: 165 [P.O.:90; I.V.:75] Out: 2 [Stool:2]  Intake/Output this shift:    Lab Results:  Basename 08/30/11 0720 08/29/11 0545 08/28/11 0630  WBC 8.4 9.3 6.9  HGB 8.6* 9.0* 10.1*  HCT 28.9* 30.0* 34.4*  PLT 153 174 135*   BMET  Basename 08/30/11 0720 08/29/11 0545 08/28/11 0630  NA 132* 131* 135  K 4.1 5.3* 5.0  CL 98 96 100  CO2 24 25 25   GLUCOSE 218* 371* 147*  BUN 35* 31* 7  CREATININE 4.15* 3.51* 2.06*  CALCIUM 8.5 8.6 8.4   LFT  Basename 08/30/11 0720 08/29/11 0545 08/28/11 0630  PROT -- -- 7.3  ALBUMIN 1.9* 2.1* 2.0*  AST -- -- 15  ALT -- -- 8  ALKPHOS -- -- 207*  BILITOT -- -- 0.6  BILIDIR -- -- --  IBILI -- -- --   PT/INR No results found for this basename: LABPROT:2,INR:2 in the last 72 hours Hepatitis Panel No results found for this basename: HEPBSAG,HCVAB,HEPAIGM,HEPBIGM in the last 72 hours  Studies/Results: Nm Hepatobiliary Liver Func  08/28/2011  *RADIOLOGY REPORT*  Clinical data: Abdominal pain.  Gallbladder wall thickening on ultrasound.  HEPATOBILIARY SCINTIGRAPHY WITH EJECTION FRACTION  Anterior imaging after5.53mCi Tc99M Choletec IV. There is  prompt clearance of the radiopharmaceutical from the blood pool. Timely visualization of activity in central bile ducts, small bowel, and gallbladder. After 1 hour,2.3 mcg CCK was infused intravenously and imaging continued. The patient describes no pain with infusion. The calculated gallbladder ejection fraction over 30 minutes is 86% (normal >30% at  thirty minutes [facility] et al., 1992 J. Nuclear Med).  IMPRESSION 1. Patency of cystic and common bile ducts. 2. Normal gallbladder ejection fraction.  Original Report Authenticated By: Trecia Rogers, M.D.    ASSESMENT: 1. Several week of abdominal pain, tenderness.  Began around time he was restarted on dialysis 2 months ago after renal transplant failed Colonoscopy 08/29/11:  Left colon tics, pale, discolored abnormal mucosa throughout cecum to sigmoid.  Biopsies pending, ? Ischemic injury.  2.  ESRD.  S/p transplant   On cyclosporine, low dose prednisone 3.  Constipation:  Miralax BID, high fibre diet.  On BID Miralax. 4.  Normocytic anemia.  On aranesp, Nulecit,  5.  IDDM 7.  Obesity   PLAN: 1.  Await pathology.    LOS: 3 days   Azucena Freed  08/30/2011, 9:46 AM Pager: [phone]

## 2011-08-31 ENCOUNTER — Inpatient Hospital Stay (HOSPITAL_COMMUNITY): Payer: Medicare Other

## 2011-08-31 ENCOUNTER — Other Ambulatory Visit: Payer: Self-pay

## 2011-08-31 DIAGNOSIS — K810 Acute cholecystitis: Secondary | ICD-10-CM

## 2011-08-31 DIAGNOSIS — I318 Other specified diseases of pericardium: Secondary | ICD-10-CM

## 2011-08-31 DIAGNOSIS — N039 Chronic nephritic syndrome with unspecified morphologic changes: Secondary | ICD-10-CM

## 2011-08-31 DIAGNOSIS — D631 Anemia in chronic kidney disease: Secondary | ICD-10-CM

## 2011-08-31 DIAGNOSIS — K559 Vascular disorder of intestine, unspecified: Secondary | ICD-10-CM

## 2011-08-31 DIAGNOSIS — N186 End stage renal disease: Secondary | ICD-10-CM

## 2011-08-31 LAB — RENAL FUNCTION PANEL
Albumin: 1.9 g/dL — ABNORMAL LOW (ref 3.5–5.2)
Albumin: 2 g/dL — ABNORMAL LOW (ref 3.5–5.2)
BUN: 17 mg/dL (ref 6–23)
BUN: 18 mg/dL (ref 6–23)
CO2: 28 mEq/L (ref 19–32)
CO2: 29 mEq/L (ref 19–32)
Calcium: 8.6 mg/dL (ref 8.4–10.5)
Calcium: 8.8 mg/dL (ref 8.4–10.5)
Chloride: 97 mEq/L (ref 96–112)
Chloride: 95 mEq/L — ABNORMAL LOW (ref 96–112)
Creatinine, Ser: 3 mg/dL — ABNORMAL HIGH (ref 0.50–1.35)
Creatinine, Ser: 3.2 mg/dL — ABNORMAL HIGH (ref 0.50–1.35)
GFR calc Af Amer: 27 mL/min — ABNORMAL LOW (ref >90)
GFR calc Af Amer: 25 mL/min — ABNORMAL LOW (ref >90)
GFR calc non Af Amer: 24 mL/min — ABNORMAL LOW (ref >90)
GFR calc non Af Amer: 22 mL/min — ABNORMAL LOW (ref >90)
Glucose, Bld: 175 mg/dL — ABNORMAL HIGH (ref 70–99)
Glucose, Bld: 113 mg/dL — ABNORMAL HIGH (ref 70–99)
Phosphorus: 3.6 mg/dL (ref 2.3–4.6)
Phosphorus: 3.2 mg/dL (ref 2.3–4.6)
Potassium: 3.9 mEq/L (ref 3.5–5.1)
Potassium: 3.8 mEq/L (ref 3.5–5.1)
Sodium: 132 mEq/L — ABNORMAL LOW (ref 135–145)
Sodium: 132 mEq/L — ABNORMAL LOW (ref 135–145)

## 2011-08-31 LAB — CBC
HCT: 29.7 % — ABNORMAL LOW (ref 39.0–52.0)
HCT: 29.5 % — ABNORMAL LOW (ref 39.0–52.0)
Hemoglobin: 8.8 g/dL — ABNORMAL LOW (ref 13.0–17.0)
Hemoglobin: 8.8 g/dL — ABNORMAL LOW (ref 13.0–17.0)
MCH: 27.3 pg (ref 26.0–34.0)
MCH: 27.3 pg (ref 26.0–34.0)
MCHC: 29.6 g/dL — ABNORMAL LOW (ref 30.0–36.0)
MCHC: 29.8 g/dL — ABNORMAL LOW (ref 30.0–36.0)
MCV: 92.2 fL (ref 78.0–100.0)
MCV: 91.6 fL (ref 78.0–100.0)
Platelets: 185 10*3/uL (ref 150–400)
Platelets: 180 10*3/uL (ref 150–400)
RBC: 3.22 MIL/uL — ABNORMAL LOW (ref 4.22–5.81)
RBC: 3.22 MIL/uL — ABNORMAL LOW (ref 4.22–5.81)
RDW: 20.7 % — ABNORMAL HIGH (ref 11.5–15.5)
RDW: 20.6 % — ABNORMAL HIGH (ref 11.5–15.5)
WBC: 7 10*3/uL (ref 4.0–10.5)
WBC: 6.9 10*3/uL (ref 4.0–10.5)

## 2011-08-31 LAB — GLUCOSE, CAPILLARY
Glucose-Capillary: 168 mg/dL — ABNORMAL HIGH (ref 70–99)
Glucose-Capillary: 99 mg/dL (ref 70–99)
Glucose-Capillary: 130 mg/dL — ABNORMAL HIGH (ref 70–99)
Glucose-Capillary: 137 mg/dL — ABNORMAL HIGH (ref 70–99)
Glucose-Capillary: 153 mg/dL — ABNORMAL HIGH (ref 70–99)

## 2011-08-31 MED ORDER — ALBUMIN HUMAN 25 % IV SOLN
12.5000 g | INTRAVENOUS | Status: DC | PRN
  Filled 2011-08-31: qty 50

## 2011-08-31 MED ORDER — LABETALOL HCL 100 MG PO TABS
100.0000 mg | ORAL_TABLET | Freq: Every day | ORAL | Status: DC
  Administered 2011-08-31 – 2011-09-05 (×6): 100 mg via ORAL
  Filled 2011-08-31 (×7): qty 1

## 2011-08-31 MED ORDER — NITROGLYCERIN 0.4 MG SL SUBL
0.4000 mg | SUBLINGUAL_TABLET | SUBLINGUAL | Status: DC | PRN
  Administered 2011-08-31 (×2): 0.4 mg via SUBLINGUAL

## 2011-08-31 NOTE — Progress Notes (Signed)
Pt complained of having some SOB. Pt says that he's having pressure in his chest. Vital signs were taken and BP was 130/72 HR 78, 98%2L. Text paged Dr.Singh. Dr.Singh called back and ordered portable chest x-ray, 12-Lead EKG, and Nitroglycerine tabs per protocol. Orders received and put in. Pt got EKG and was shown to Dr.Singh. Will continue to monitor.

## 2011-08-31 NOTE — Progress Notes (Signed)
Pt reports that he feels "much better" but still has a little SOB. Pt is sitting on the side of the bed starting to eat lunch and his wife is at the bedside. Pt has had 2 Nitroglycerine tabs. Will continue to monitor.

## 2011-08-31 NOTE — Progress Notes (Signed)
Pt given SL nitro at 1203. Will continue to assess. MD aware.

## 2011-08-31 NOTE — Progress Notes (Signed)
Plan as above  Leighton Ruff. Redmond Pulling, MD, FACS General, Bariatric, & Minimally Invasive Surgery [facility], [place]

## 2011-08-31 NOTE — Progress Notes (Signed)
@   Subjective:  No chest pain; mild dyspnea; lower abdominal pain worse this AM.   Objective:  Filed Vitals:   08/30/11 1401 08/30/11 1738 08/30/11 1944 08/31/11 0532  BP: 103/57 115/70 139/72 131/67  Pulse: 95 83 83 79  Temp: 98.7 F (37.1 C) 98.2 F (36.8 C) 97.5 F (36.4 C) 97.5 F (36.4 C)  TempSrc:   Oral Oral  Resp: 18 18 18 18   Height:   5\' 8"  (1.727 m)   Weight:   118.4 kg (261 lb 0.4 oz)   SpO2: 99% 100% 100% 99%    Intake/Output from previous day:  Intake/Output Summary (Last 24 hours) at 08/31/11 0750 Last data filed at 08/30/11 2100  Gross per 24 hour  Intake    440 ml  Output   1069 ml  Net   -629 ml    Physical Exam: Physical exam: Well-developed well-nourished in no acute distress.  Skin is warm and dry.  HEENT is normal.  Neck is supple. JVD difficult to assess Chest is clear to auscultation with normal expansion.  Cardiovascular exam is regular rate and rhythm.  Abdominal exam tender to palpation particularly in low abdomen, abdominal wall edema Extremities s/p left BKA; trace edema and chronic skin changes RLE neuro grossly intact    Lab Results: Basic Metabolic Panel:  Basename 08/31/11 0601 08/30/11 0720  NA 132* 132*  K 3.9 4.1  CL 97 98  CO2 28 24  GLUCOSE 175* 218*  BUN 17 35*  CREATININE 3.00* 4.15*  CALCIUM 8.6 8.5  MG -- --  PHOS 3.6 5.1*   CBC:  Basename 08/31/11 0601 08/30/11 0720  WBC 7.0 8.4  NEUTROABS -- --  HGB 8.8* 8.6*  HCT 29.7* 28.9*  MCV 92.2 90.6  PLT 185 153    Assessment/Plan:  1) Pericardial effusion - clinically no signs of tamponade; repeat echo yesterday reveals moderate pericardial effusion with no signs of tamponade and no clinical signs of tamponade. Etiology of effusion unclear. Given h/o immunosuppression for renal transplant, would have ID see patient for recommendations for further eval. Adenopathy recently noted and felt possibly related to lymphoproliferative disease due to immunosuppression.  Will repeat echo 2 weeks after DC.   2) Lower abdominal pain - GI eval in progress; abnormal mucosa from cecum to sigmoid; ? Mild ischemic injury, bx pending. 3) Renal insufficiency-management per nephrology. 4) PVD-continue ASA; would benefit from a statin long term. After acute illness improves, would favor functional study as outpatient given multiple risk factors and evidence of PVD (high risk for CAD). 5) Mildly elevated TSH-management per primary care.  Kirk Ruths 08/31/2011, 7:50 AM

## 2011-08-31 NOTE — Progress Notes (Addendum)
Subjective:  Still having a lot of abdominal discomfort but good BM this am  Vital signs in last 24 hours: Filed Vitals:   08/30/11 1401 08/30/11 1738 08/30/11 1944 08/31/11 0532  BP: 103/57 115/70 139/72 131/67  Pulse: 95 83 83 79  Temp: 98.7 F (37.1 C) 98.2 F (36.8 C) 97.5 F (36.4 C) 97.5 F (36.4 C)  TempSrc:   Oral Oral  Resp: 18 18 18 18   Height:   5\' 8"  (1.727 m)   Weight:   118.4 kg (261 lb 0.4 oz)   SpO2: 99% 100% 100% 99%  BP 160/80  Pulse 81  Temp(Src) 97.8 F (36.6 C) (Oral)  Resp 18  Ht 5\' 8"  (1.727 m)  Wt 118.4 kg (261 lb 0.4 oz)  BMI 39.69 kg/m2  SpO2 97% Weight change: -1.3 kg (-2 lb 13.9 oz)  Intake/Output Summary (Last 24 hours) at 08/31/11 0925 Last data filed at 08/30/11 2100  Gross per 24 hour  Intake    440 ml  Output   1069 ml  Net   -629 ml   Labs: Basic Metabolic Panel:  Lab [id number] 0601 08/30/11 0720 08/29/11 0545  NA 132* 132* 131*  K 3.9 4.1 5.3*  CL 97 98 96  CO2 28 24 25   GLUCOSE 175* 218* 371*  BUN 17 35* 31*  CREATININE 3.00* 4.15* 3.51*  CALCIUM 8.6 8.5 8.6  ALB -- -- --  PHOS 3.6 5.1* 4.5   Liver Function Tests:  Lab 08/31/11 0601 08/30/11 0720 08/29/11 0545 08/28/11 0630 08/27/11 0820  AST -- -- -- 15 13  ALT -- -- -- 8 9  ALKPHOS -- -- -- 207* 197*  BILITOT -- -- -- 0.6 0.5  PROT -- -- -- 7.3 6.9  ALBUMIN 1.9* 1.9* 2.1* -- --    Lab 08/27/11 0820  LIPASE 26  AMYLASE --   No results found for this basename: AMMONIA:3 in the last 168 hours CBC:  Lab 08/31/11 0601 08/30/11 0720 08/29/11 0545 08/28/11 0630 08/27/11 0820  WBC 7.0 8.4 9.3 -- --  NEUTROABS -- -- -- -- 3.7  HGB 8.8* 8.6* 9.0* -- --  HCT 29.7* 28.9* 30.0* -- --  MCV 92.2 90.6 91.2 92.5 91.8  PLT 185 153 174 -- --   Cardiac Enzymes: No results found for this basename: CKTOTAL:5,CKMB:5,CKMBINDEX:5,TROPONINI:5 in the last 168 hours CBG:  Lab 08/31/11 0734 08/30/11 2112 08/30/11 1947 08/30/11 1648 08/30/11 1125  GLUCAP 168* 146* 142* 111* 131*       Scheduled Meds . aspirin  81 mg Oral Daily  . calcium acetate  1,334 mg Oral TID WC  . cycloSPORINE modified  75 mg Oral BID  . darbepoetin (ARANESP) injection - DIALYSIS  100 mcg Intravenous Q Thu-HD  . feeding supplement  1 Container Oral TID BM  . ferric gluconate (FERRLECIT/NULECIT) IV  125 mg Intravenous Weekly  . insulin aspart  0-5 Units Subcutaneous QHS  . insulin aspart  0-9 Units Subcutaneous TID WC  . insulin glargine  25 Units Subcutaneous Daily  . labetalol  100 mg Oral BID  . levothyroxine  75 mcg Oral QAC breakfast  . multivitamin  1 tablet Oral Daily  . paricalcitol  2 mcg Intravenous Q T,Th,Sa-HD  . polyethylene glycol  17 g Oral BID  . predniSONE  5 mg Oral Q breakfast  . DISCONTD: labetalol  100 mg Oral BID    I  have reviewed scheduled and prn medications.  Physical Exam:   General: alert,  comfortable, NAD  Heart: RRR, normal S1,S2; no gallops or rubs  Lungs:CTAB, slightly diminished right; no rhonchi, rales, or wheezes Abdomen:obese with diffuse abdominal tenderness; abd wall hernia; allograft NT Extremities:chronic skin changes on LE; drsg intact RLE;  left BKA  Dialysis Access: RIJ PC; maturing RUA AVF +T/B  Assessment/Plan:  1. Pericardial effusion - incidental finding during screening echo for possible surgery; no evidence of tamponade; cardiology following; repeat 2D echo showed a moderate, free-flowing pericardial effusion circumferential to theheart. There was no evidence of hemodynamic compromise.  2. Abdominal pain/Constipation/Ischemic Colitis- colonoscopy with tics and questionable area of ischemic change possible; surgery following (right colon biopsies are consistent with ischemia) Will try to avoid low BP with HD    3. ESRD/s/p failed transplant - TTS SWAF;on cyclosporine 75 bid and prednisone 5; next HD tomorrow 4K bath with repeat labs pending  4. Anemia - Hgb^ 8.8;recent ^ Aranesp 12mcg qwk and weekly IV Fe; follow trend   5.  Secondary hyperparathyroidism - ca 8.6; phos 3.6; on phoslo; diet intake poor; cont Zemplar @ 2 mcg q HD;follow ca/phos closely   6. HTN/volume - hypotensive during HD; recent decreased dose labetalol; pain meds factor; incidental pericardial effusion on Echo; use albumin 25% prn during HD and cont attempt max UF; follow closely; reduce labetolol to once/day - ? If will still need at all   7. Nutrition - albumin 1.9; diet advanced; has gastroparesis and some nausea; ^ high protein diet and encourage more intake w/ resource (^ protein CL supplement)  8. DM/gastroparesis- (has insulin pump at home); CBG and SSI while inpt; HgbA1c 6.8%; not getting his reglan; defer to GI vs  primary following  9. Venous/Vascular insufficiency- wound care following 10. Hypothyroidism- on synthroid; TSH 5.48 on adm; defer primary  12. OSA/Obesity- wears CPAP  13. Disposition- per primary services   Marni Griffon, FNP-C [facility] Pager [phone]  08/31/2011,9:25 AM  LOS: 4 days   Concur with note of Linna Hoff FNP-C with highlighted additions.  With ischemic bowel, will attempt to avoid hypotension with HD - have weaned BP med down again and may not need at all.  Has a fair amount of volume to remove by exam so will have to inch that down. Cleotha Tsang B

## 2011-08-31 NOTE — Progress Notes (Signed)
Pt given second tab of SL Nitro. Still feeling some chest pressure. Will continue to monitor.

## 2011-08-31 NOTE — Progress Notes (Signed)
[facility] Progress Note  Subjective: Pt has BM this am, which was slightly loose, but normal size (1st "real" BM since colonoscopy) Still with diffuse abd pain, slightly worse today Able to eat, but eating seems to make pain slightly worse.  No nausea or vomiting.  No fevers.  Objective:  Vital signs in last 24 hours: Temp:  [97.5 F (36.4 C)-98.7 F (37.1 C)] 97.8 F (36.6 C) (05/08 0900) Pulse Rate:  [79-95] 81  (05/08 0900) Resp:  [18] 18  (05/08 0900) BP: (103-160)/(57-80) 160/80 mmHg (05/08 0900) SpO2:  [97 %-100 %] 97 % (05/08 0900) Weight:  [261 lb 0.4 oz (118.4 kg)] 261 lb 0.4 oz (118.4 kg) (05/07 1944) Last BM Date: 08/30/11 Gen: awake, alert, NAD HEENT: anicteric, op clear CV: RRR Pulm: CTA b/l anteriorly Abd: soft, obese, diffusely tender without rebound or guarding,+BS Ext: L BKA Neuro: nonfocal   Intake/Output from previous day: 05/07 0701 - 05/08 0700 In: 440 [P.O.:440] Out: 1069 [Urine:1] Intake/Output this shift:    Lab Results:  Basename 08/31/11 0601 08/30/11 0720 08/29/11 0545  WBC 7.0 8.4 9.3  HGB 8.8* 8.6* 9.0*  HCT 29.7* 28.9* 30.0*  PLT 185 153 174   BMET  Basename 08/31/11 0601 08/30/11 0720 08/29/11 0545  NA 132* 132* 131*  K 3.9 4.1 5.3*  CL 97 98 96  CO2 28 24 25   GLUCOSE 175* 218* 371*  BUN 17 35* 31*  CREATININE 3.00* 4.15* 3.51*  CALCIUM 8.6 8.5 8.6   LFT  Basename 08/31/11 0601  PROT --  ALBUMIN 1.9*  AST --  ALT --  ALKPHOS --  BILITOT --  BILIDIR --  IBILI --    Assessment / Plan: 46 yo male with ESRD on HD here with abd pain, constipation also with pericardial effusion without tamponade physiology   1. Abd pain/colonic ischemia -- right colon biopsies are consistent with ischemia.  Endoscopically this is mild without ulceration (mucosa was pale in some locations with purplish discoloration in others).  Nontoxic without leukocytosis, fever, or GI bleeding.  This is felt secondary to low flow state,  likely related to HD.  No easy solution.  Though, we want to avoid constipation (his pain seemed better after colon prep).  He is on Miralax 17g daily.  Had BM today, so will not increase laxatives now.   Overall goal is to avoid hypoperfusion related to HD sessions which could compromise colonic blood flow.  Will defer to renal how best to manage this.  His anti-HTN regimen has been adjusted recently which likely will be beneficial.    2. Gallbladder -- CT concerning for cholecystitis, but HIDA scan was unremarkable.  I do not think GB is the main reason for his abd pain.  [facility] has plan for outpatient evaluation if needed.  Call with questions.  Active Problems:  History of renal transplant  S/P BKA (below knee amputation)  Obstructive sleep apnea  CHF (congestive heart failure)  Anemia  Diabetes mellitus  ESRD (end stage renal disease)  Abdominal pain  Pericardial effusion     LOS: 4 days   Benito Lemmerman M  08/31/2011, 10:48 AM

## 2011-08-31 NOTE — Progress Notes (Signed)
Back in to see pt with acute SOB without CP "Feels like when I went to the [facility] ER"  Abrupt in onset BP 130/72  Pulse 78  Temp(Src) 97.8 F (36.6 C) (Oral)  Resp 20  Ht 5\' 8"  (1.727 m)  Wt 118.4 kg (261 lb 0.4 oz)  BMI 39.69 kg/m2  SpO2 99% Using accessory muscles Decreased basilar air movement No Rub Heart sounds distant Edema of LE's unchanged from earlier  ECG - normal sinus without acute changes CXR - radiology here to do film (ordered by Dr. [name]?)  Imp:  Acute SOB ECG looks normal CXR pending - if edema, will need HD today If negative for edema or infiltrate, will likely need to do CT angio Moneisha Vosler B

## 2011-08-31 NOTE — Progress Notes (Signed)
CXR vascular congestion, interstitial edema Will HD today Earnest Thalman B

## 2011-08-31 NOTE — Procedures (Signed)
I have personally attended this patient's dialysis session.  He is being dialyzed off schedule this afternoon due to symptoms and radiographic evidence of CHF. 1690 of 3500 off so far. Will obtain f/u cxr in the AM and plan to dialyze again tomorrow.  Pre-HD weight today was 120 kg. Our eventual goal is 114.  Dale [name]

## 2011-08-31 NOTE — Progress Notes (Addendum)
Patient Demographics  Dale [name], is a 46 y.o. male  [id number]  [id number]  DOB - [birth year]  Admit date - 08/27/2011  Admitting Physician Silvestre Moment, MD  Outpatient Primary MD for the patient is Windy Kalata, MD, MD  LOS - 4     Chief Complaint  Patient presents with  . Shortness of Breath        Subjective:   Dale [name] today has, No headache, No chest pain, No abdominal pain - No Nausea, No new weakness tingling or numbness, No Cough - mild SOB on 08/31/2011.   Objective:   Filed Vitals:   08/30/11 1944 08/31/11 0532 08/31/11 0900 08/31/11 1146  BP: 139/72 131/67 160/80 130/72  Pulse: 83 79 81 78  Temp: 97.5 F (36.4 C) 97.5 F (36.4 C) 97.8 F (36.6 C)   TempSrc: Oral Oral Oral   Resp: 18 18 18 20   Height: 5\' 8"  (1.727 m)     Weight: 118.4 kg (261 lb 0.4 oz)     SpO2: 100% 99% 97% 99%    Wt Readings from Last 3 Encounters:  08/30/11 118.4 kg (261 lb 0.4 oz)  08/30/11 118.4 kg (261 lb 0.4 oz)  08/30/11 118.4 kg (261 lb 0.4 oz)     Intake/Output Summary (Last 24 hours) at 08/31/11 1356 Last data filed at 08/31/11 1300  Gross per 24 hour  Intake    520 ml  Output      2 ml  Net    518 ml    Exam Awake Alert, Oriented *3, No new F.N deficits, Normal affect [place].AT,PERRAL Supple Neck,No JVD, No cervical lymphadenopathy appriciated.  Symmetrical Chest wall movement, Good air movement bilaterally, 2 bibasilar Rales RRR,No Gallops,Rubs or new Murmurs, No Parasternal Heave +ve B.Sounds, Abd Soft, Non tender, No organomegaly appriciated, No rebound -guarding or rigidity. No Cyanosis, Clubbing or edema, No new Rash or bruise, left BKA  Data Review  CBC  Lab 08/31/11 0601 08/30/11 0720 08/29/11 0545 08/28/11 0630 08/27/11 0840 08/27/11 0820  WBC 7.0 8.4 9.3 6.9 -- 6.8  HGB 8.8* 8.6* 9.0* 10.1* 10.2* --  HCT 29.7* 28.9* 30.0* 34.4* 30.0* --  PLT 185 153 174 135* -- 160  MCV 92.2 90.6 91.2 92.5 -- 91.8  MCH 27.3 27.0 27.4 27.2 --  27.2  MCHC 29.6* 29.8* 30.0 29.4* -- 29.6*  RDW 20.7* 20.9* 20.5* 20.6* -- 19.8*  LYMPHSABS -- -- -- -- -- 1.6  MONOABS -- -- -- -- -- 0.7  EOSABS -- -- -- -- -- 0.7  BASOSABS -- -- -- -- -- 0.1  BANDABS -- -- -- -- -- --    Chemistries   Lab 08/31/11 0601 08/30/11 0720 08/29/11 0545 08/28/11 0630 08/27/11 0840 08/27/11 0820  NA 132* 132* 131* 135 137 --  K 3.9 4.1 5.3* 5.0 3.4* --  CL 97 98 96 100 97 --  CO2 28 24 25 25  -- 30  GLUCOSE 175* 218* 371* 147* 159* --  BUN 17 35* 31* 7 17 --  CREATININE 3.00* 4.15* 3.51* 2.06* 3.00* --  CALCIUM 8.6 8.5 8.6 8.4 -- 9.0  MG -- -- -- -- -- --  AST -- -- -- 15 -- 13  ALT -- -- -- 8 -- 9  ALKPHOS -- -- -- 207* -- 197*  BILITOT -- -- -- 0.6 -- 0.5   ------------------------------------------------------------------------------------------------------------------ estimated creatinine clearance is 38.9 ml/min (by C-G formula based on Cr of 3). ------------------------------------------------------------------------------------------------------------------ No results found for this basename: HGBA1C:2  in the last 72 hours ------------------------------------------------------------------------------------------------------------------ No results found for this basename: CHOL:2,HDL:2,LDLCALC:2,TRIG:2,CHOLHDL:2,LDLDIRECT:2 in the last 72 hours ------------------------------------------------------------------------------------------------------------------ No results found for this basename: TSH,T4TOTAL,FREET3,T3FREE,THYROIDAB in the last 72 hours ------------------------------------------------------------------------------------------------------------------ No results found for this basename: VITAMINB12:2,FOLATE:2,FERRITIN:2,TIBC:2,IRON:2,RETICCTPCT:2 in the last 72 hours  Coagulation profile No results found for this basename: INR:5,PROTIME:5 in the last 168 hours  No results found for this basename: DDIMER:2 in the last 72  hours  Cardiac Enzymes No results found for this basename: CK:3,CKMB:3,TROPONINI:3,MYOGLOBIN:3 in the last 168 hours ------------------------------------------------------------------------------------------------------------------ No components found with this basename: POCBNP:3  Micro Results Recent Results (from the past 240 hour(s))  MRSA PCR SCREENING     Status: Normal   Collection Time   08/23/11  9:42 AM      Component Value Range Status Comment   MRSA by PCR NEGATIVE  NEGATIVE  Final     Radiology Reports Ct Abdomen Pelvis Wo Contrast  08/27/2011  *RADIOLOGY REPORT*  Clinical Data: Worsening bilateral lower quadrant abdominal pain. Surgical history includes recent left superficial inguinal lymph node resection, appendectomy, renal transplant, and hernia repair.  CT ABDOMEN AND PELVIS WITHOUT CONTRAST 08/27/2011:  Technique:  Multidetector CT imaging of the abdomen and pelvis was performed following the standard protocol without intravenous contrast.  Comparison: Unenhanced CT abdomen and pelvis 08/22/2011, 08/04/2011.  Findings: Interval slight increase in size of the fluid collection in the left superficial inguinal region (overlying the common femoral artery and vein, not within the left inguinal canal), current measurements approximating 3.9 x 3.9 cm (series 2, image 97), previously 4.1 x 3.3 cm.  The fluid collection has a Hounsfield measurement of 6, indicating simple fluid.  On the prior examination, gas bubbles were present within the collection, and this is in the area of the recent lymph node resection.  Interval development of thickening of the wall of the gallbladder, with edema/inflammation surrounding the gallbladder.  No biliary ductal dilation.  Normal low dose unenhanced appearance of the liver, spleen, and adrenal glands.  Stable mild pancreatic atrophy. Atrophic native kidneys consistent with end-stage renal disease, without focal parenchymal abnormality.  Moderate to  severe aorto- iliofemoral and visceral artery atherosclerosis without aneurysm.  Lymphadenopathy in the retroperitoneum of the abdomen and both sides of the pelvis, unchanged.  Bilateral superficial inguinal lymphadenopathy, unchanged.  No new or enlarging lymphadenopathy. Index left retroperitoneal node approximately 3-4 cm above the aortic bifurcation measures approximately 2.3 x 1.7 cm (series 2, image 46).  Index right retrocrural lymph node is stable at 1.1 cm (series 2, image 19).  Stomach and small bowel normal in appearance.  Distal descending and sigmoid colon diverticulosis without evidence of acute diverticulitis.  Remainder of the colon unremarkable.  Apparent wall thickening in the cecum is felt to be related to decompression.  Lipoma again noted involving the ileocecal valve. No ascites.  Satisfactory appearance of the renal transplant in the left side of the pelvis, without evidence of hydronephrosis or focal parenchymal abnormality.  Urinary bladder unremarkable.  Prostate gland and seminal vesicles normal.  No evidence of recurrent right inguinal hernia.  Diffuse body wall edema.  Bone window images demonstrate degenerative changes involving the lower thoracic and upper lumbar spine.  Interval increase in size of the now large pericardial effusion. Interval increase in size of a small bilateral pleural effusions. Associated mild passive atelectasis in the lower lobes.  IMPRESSION:  1.  Interval slight increase in size of the postoperative seroma or liquefied hematoma in the left superficial inguinal region at the site  of prior lymph node resection. 2.  Stable retroperitoneal and bilateral superficial inguinal lymphadenopathy. 3.  CT findings consistent with acute cholecystitis, new. 4.  Interval increase in size of a now large pericardial effusion. 5.  Interval increase in size of small bilateral pleural effusions. Associated passive atelectasis in the lower lobes, left greater than right. 6.   Distal descending and sigmoid colon diverticulosis without evidence of acute diverticulitis. 7.  Satisfactory unenhanced appearance of the renal transplant in the left side of the pelvis.  These results were called by telephone on 08/27/2011  at  1210 hours to  Dr. Maryan Rued of the emergency department, who verbally acknowledged these results.  Original Report Authenticated By: Deniece Portela, M.D.   Ct Abdomen Pelvis Wo Contrast  08/22/2011  *RADIOLOGY REPORT*  Clinical Data: Abdominal distention, constipation  CT ABDOMEN AND PELVIS WITHOUT CONTRAST  Technique:  Multidetector CT imaging of the abdomen and pelvis was performed following the standard protocol without intravenous contrast.  Comparison: 08/06/2011  Findings: There is small pericardial effusion measures 7 mm maximum thickness.  Atherosclerotic calcifications of the coronary arteries. Small left pleural effusion with left base posterior atelectasis.  NG tube with tip coiled within stomach.  Sagittal images of the spine shows degenerative changes lower thoracic spine.  The study is limited without IV contrast.  The unenhanced liver is unremarkable. Unenhanced pancreas, spleen and adrenals are unremarkable. Again noted renal atrophy and chronic renal cortical thinning. Vascular calcifications bilateral kidney are stable.  Again noted multiple enlarged retroperitoneal and mesenteric lymph nodes.  A left periaortic lymph node in axial image 37 measures 2 x 1.6 cm.  Moderate stool noted in the right colon.  Small amount of layering sludge  or  tiny gallstones are suspected within dependent gallbladder.  Atherosclerotic calcifications are again noted SMA, splenic artery bilateral renal artery origin.  Again noted a transplanted kidney in the left iliac fossa stable in appearance without evidence of hydronephrosis or hydroureter.  Mild distended urinary bladder.  Oral contrast material was given to the patient.  No small bowel or colonic obstruction.   Enlarged lymph nodes bilateral inguinal region are again noted.  The largest right inguinal lymph node measures 1.7 x 1.9 cm.  The largest left inguinal lymph node measures 1.7 x 1.5 cm.  There is a fluid collection in the left inguinal region measures 12 HU in attenuation.  This measures 4 x 3.3 cm.  May represent a resolving seroma or hematoma.  There is thickening of the skin anterior abdominal wall and mild subcutaneous anasarca infiltration of the subcutaneous fat.  A small midline lower ventral hernia containing fat measures 3.5 cm only partially visualized.  Postsurgical changes are noted right inguinal region.  No destructive bony lesions are noted within pelvis.  IMPRESSION:  1.  There is small pericardial effusion.  Small left pleural effusion with left base posteriorly atelectasis. 2.  Again noted atrophic native kidneys.  Stable transplanted kidney in the left iliac fossa.  No hydronephrosis or hydroureter. 3.  Again noted retroperitoneal and mesenteric enlarged lymph nodes.  Bilateral enlarged inguinal lymph nodes again noted. 4.  There is a small fluid collection in the left inguinal region measures 4 x 3.3 cm.  This may represent a seroma or resolving hematoma.  Less likely inguinal abscess. 5.  Moderate stool noted in the right colon.  No small bowel or colonic obstruction.  Original Report Authenticated By: Lahoma Crocker, M.D.   Ct Abdomen Pelvis Wo Contrast  08/04/2011  *  RADIOLOGY REPORT*  Clinical Data: Recurrent renal failure requiring hemodialysis. History of renal transplant.  CT ABDOMEN AND PELVIS WITHOUT CONTRAST  Technique:  Multidetector CT imaging of the abdomen and pelvis was performed following the standard protocol without intravenous contrast.  Comparison: Right upper quadrant abdominal ultrasound 07/14/2011. Abdominal CT 08/26/2004.  Findings: Images through the lung bases demonstrate cardiomegaly and hemodialysis catheters within the right atrium.  There is vascular congestion and  mild bibasilar atelectasis.  No focal abnormalities are seen within the liver, spleen, gallbladder, pancreas or adrenal glands.  The spleen is slightly larger than on the prior CT, but without focal abnormality.  There is severe chronic cortical thinning of the native kidneys bilaterally.  Appearance is unchanged.  The transplanted kidney in the left iliac fossa appears stable without hydronephrosis, significant cortical thinning or surrounding inflammatory change.  The patient has developed multiple enlarged retroperitoneal and mesenteric lymph nodes.  Examples include an 11 mm right retrocrural node on image 16, a mesenteric node measuring 3.6 x 2.3 cm on image 31, a left periaortic node measuring 2.3 x 3.1 cm on image 46 and a 2.0 x 3.8 cm left external iliac node on image 79. In addition, there are several mildly enlarged lymph nodes within the left groin.  There are postsurgical changes in the right groin without significant adenopathy.  No bowel lesions are identified.  There is no evidence of bowel obstruction.  The bladder contents demonstrate mildly increased density.  There is no evidence of diskitis or other acute osseous abnormality.  IMPRESSION:  1.  Interval development of multi focal lymphadenopathy suspicious for lymphoma.  Enlarged lymph nodes in the left groin are most accessible for excisional biopsy. 2.  Slight interval increase in splenic size without significant splenomegaly or focal abnormality. 3.  Transplanted kidney in the left iliac fossa has a stable appearance without hydronephrosis or perinephric soft tissue stranding.  Chronic atrophy of the native kidneys appears unchanged. 4.  High density urine within the urinary bladder.  Original Report Authenticated By: Vivia Ewing, M.D.   Dg Chest 2 View  08/02/2011  *RADIOLOGY REPORT*  Clinical Data: History of difficulty breathing.  History of fever.  CHEST - 2 VIEW  Comparison: 07/22/2011.  08/01/2011.  Findings: Right sided dialysis  catheter has tips in right atrial region.  There is stable cardiac silhouette enlargement.  There is upper lobe vascular prominence with pulmonary venous hypertension vascular congestion pattern.  No consolidation or definite pleural effusion is seen.  There is slight elevation of the margin of the left hemidiaphragm with minimal basilar atelectasis. On the lateral image there appears to be some posterior atelectasis and patchy infiltrative density without consolidation.  There is minimal degenerative spondylosis.  IMPRESSION: Dialysis catheter in place.  Stable cardiac silhouette enlargement. Pulmonary venous hypertension vascular congestion pattern.  No consolidation or pleural effusion. Left basilar and posterior atelectasis and patchy infiltrative densities without consolidation.  Original Report Authenticated By: Delane Ginger, M.D.   Nm Hepatobiliary Liver Func  08/28/2011  *RADIOLOGY REPORT*  Clinical data: Abdominal pain.  Gallbladder wall thickening on ultrasound.  HEPATOBILIARY SCINTIGRAPHY WITH EJECTION FRACTION  Anterior imaging after5.39mCi Tc99M Choletec IV. There is prompt clearance of the radiopharmaceutical from the blood pool. Timely visualization of activity in central bile ducts, small bowel, and gallbladder. After 1 hour,2.3 mcg CCK was infused intravenously and imaging continued. The patient describes no pain with infusion. The calculated gallbladder ejection fraction over 30 minutes is 86% (normal >30% at  thirty minutes (  Ziessman et al., 1992 J. Nuclear Med).  IMPRESSION 1. Patency of cystic and common bile ducts. 2. Normal gallbladder ejection fraction.  Original Report Authenticated By: Trecia Rogers, M.D.   US Abdomen Complete  08/27/2011  *RADIOLOGY REPORT*  Clinical Data:  Right upper quadrant pain, nausea; history of kidney transplant  ABDOMINAL ULTRASOUND COMPLETE  Comparison:  None.  Findings:  Gallbladder:   No gallstones are identified at but there is gallbladder wall  thickening and pericholecystic fluid.  Common Bile Duct:  Within normal limits in caliber. Measures 3 mm.  Liver: No focal mass lesion identified.  Within normal limits in parenchymal echogenicity.  IVC:  Appears normal.  Pancreas:  No abnormality identified.  Spleen:  Within normal limits in size and echotexture.  Right kidney:  Atrophic. No evidence of mass or hydronephrosis.  Left kidney: Atrophic.  No evidence of mass or hydronephrosis.  The left lower quadrant transplanted kidney is not imaged.  Abdominal Aorta:  No aneurysm identified.  IMPRESSION: Acalculous cholecystitis with gallbladder wall thickening and pericholecystic fluid.  Original Report Authenticated By: Duayne Cal, M.D.   US Biopsy  08/05/2011  *RADIOLOGY REPORT*  Clinical history:46 year old with history of renal transplant and fevers.  The patient has lymphadenopathy in the retroperitoneum and left inguinal region.  PROCEDURE(S): ULTRASOUND GUIDED BIOPSY OF LEFT INGUINAL LYMPH NODE.  Physician: Stephan Minister. Henn, MD  Medications:None  Moderate sedation time:None  Fluoroscopy time: None  Procedure:The procedure was explained to the patient.  The risks and benefits of the procedure were discussed and the patient's questions were addressed.  Informed consent was obtained from the patient.  The left inguinal area was evaluated with ultrasound. Enlarged lymph nodes were identified.  Prominent lymph node anterior to the saphenofemoral junction of the left common femoral vein was targeted.  The left groin was prepped and draped in a sterile fashion.  The skin was anesthetized with 1% lidocaine.  18 gauge core needle was directed into the lymph node with ultrasound guidance.  Five core biopsies were obtained.  Samples placed in saline.  Specimens sent for pathology and microbiology.  Findings:Multiple enlarged left inguinal lymph nodes.  Prominent lymph node anterior to the saphenofemoral junction was biopsied. No significant bleeding following the  core biopsies.  Complications: None  Impression:Ultrasound guided core biopsies of a large left inguinal lymph node.  Samples sent for pathology and microbiology.  Original Report Authenticated By: Markus Daft, M.D.   Dg Chest Port 1 View  08/31/2011  *RADIOLOGY REPORT*  Clinical Data: Shortness of breath and chest pressure.  Dialysis.  PORTABLE CHEST - 1 VIEW  Comparison: 08/23/2011  Findings: Marked cardiac enlargement.  Mild vascular congestion without overt failure or focal infiltrates.  New left base process, likely pleural effusion, with left lower lobe retrocardiac subsegmental atelectasis, not appreciated on priors.  Dialysis catheter good position and unchanged from priors.  No pneumothorax.  IMPRESSION: Marked cardiac enlargement with slight vascular congestion. New left pleural effusion with left base   subsegmental atelectasis.  Original Report Authenticated By: Staci Righter, M.D.   Dg Chest Portable 1 View  08/23/2011  *RADIOLOGY REPORT*  Clinical Data: Shortness of breath  PORTABLE CHEST - 1 VIEW  Comparison: 08/03/2011  Findings: Cardiomegaly.  Central vascular congestion. Mild opacity at the left lung base.  Otherwise, no focal consolidation.  No overt edema.  No pleural effusion or pneumothorax.  Large bore right subclavian catheter tip projects over the cavoatrial junction.  No acute osseous abnormality identified.  IMPRESSION: Cardiomegaly.  Vascular congestion without overt edema.  Mild left lung base opacity; atelectasis versus infiltrate.  Original Report Authenticated By: Suanne Marker, M.D.   Dg Chest Port 1 View  08/03/2011  *RADIOLOGY REPORT*  Clinical Data: Cough and shortness of breath.  PORTABLE CHEST - 1 VIEW  Comparison: 08/12/2011.  Findings: The diatek catheter is stable.  The heart is enlarged but stable.  The lungs are better aerated with resolving edema.  No effusions.  IMPRESSION: Slight improved lung aeration.  Vascular congestion without pulmonary edema.  Original  Report Authenticated By: P. Kalman Jewels, M.D.   Dg Chest Port 1 View  08/01/2011  *RADIOLOGY REPORT*  Clinical Data: Central line placement.  PORTABLE CHEST - 1 VIEW  Comparison: 07/22/2011.  Findings: 1504 hours.  Right IJ dialysis catheter tips are present within the right atrium.  There is stable cardiomegaly with chronic vascular congestion.  No confluent airspace opacity, significant pleural effusion or pneumothorax is seen.  IMPRESSION: Dialysis catheter placement without demonstrated pneumothorax. Stable cardiomegaly and vascular congestion.  Original Report Authenticated By: Vivia Ewing, M.D.   Dg Abd Acute W/chest  08/27/2011  *RADIOLOGY REPORT*  Clinical Data: Abdominal pain and shortness of breath.  ACUTE ABDOMEN SERIES (ABDOMEN 2 VIEW & CHEST 1 VIEW)  Comparison: Chest x-ray fourth 32,013.  Findings: The upright chest x-ray demonstrates stable cardiac enlargement.  The right IJ diatek catheter is stable.  There is vascular congestion and mild pulmonary edema with small pleural effusions.  Two views of the abdomen demonstrate an unremarkable bowel gas pattern.  There is scattered air and stool in the colon.  No distended small bowel loops to suggest obstruction.  No free air. The bony structures are intact.  IMPRESSION:  1.  Cardiac enlargement with vascular congestion, mild interstitial edema and small effusions. 2.  No plain film findings for an acute abdominal process.  Original Report Authenticated By: P. Kalman Jewels, M.D.   Dg Abd Acute W/chest  08/22/2011  *RADIOLOGY REPORT*  Clinical Data: Abdominal pain and constipation.  ACUTE ABDOMEN SERIES (ABDOMEN 2 VIEW & CHEST 1 VIEW)  Comparison: Chest x-ray 08/03/2011.  Abdominal x-ray 04/06/2008.  Findings: There is a right internal jugular PermCath with the tips in the right atrium.  Lung volumes are normal.  Minimal dependent bibasilar opacities are favored to reflect areas of subsegmental atelectasis.  No definite pleural effusions.   Mild pulmonary venous congestion without frank pulmonary edema.  Heart size is mildly enlarged (unchanged).  Mediastinal contours are unremarkable. Atherosclerotic calcifications within the arch of the aorta.  No pneumoperitoneum  Supine and upright views of the abdomen demonstrate gas and stool scattered throughout the colon extending to the level of the distal rectum.  There are some borderline and mildly dilated loops of gas- filled small bowel (up to 4.2 cm in diameter) throughout the central and upper abdomen.  No definite air-fluid levels are noted on the upright projection.  IMPRESSION: 1.  Nonspecific bowel gas pattern, as above.  Findings may simply reflect an ileus, however, clinical correlation for signs and symptoms of early or partial small bowel obstruction is recommended. 2.  No pneumoperitoneum. 3.  No radiographic evidence of acute cardiopulmonary disease. 4.  Mild cardiomegaly is unchanged. 5.  Support apparatus, as above.  Original Report Authenticated By: Etheleen Mayhew, M.D.    Scheduled Meds:   . aspirin  81 mg Oral Daily  . calcium acetate  1,334 mg Oral TID WC  . cycloSPORINE modified  75 mg Oral BID  . darbepoetin (ARANESP) injection - DIALYSIS  100 mcg Intravenous Q Thu-HD  . feeding supplement  1 Container Oral TID BM  . ferric gluconate (FERRLECIT/NULECIT) IV  125 mg Intravenous Weekly  . insulin aspart  0-5 Units Subcutaneous QHS  . insulin aspart  0-9 Units Subcutaneous TID WC  . insulin glargine  25 Units Subcutaneous Daily  . labetalol  100 mg Oral QHS  . levothyroxine  75 mcg Oral QAC breakfast  . multivitamin  1 tablet Oral Daily  . paricalcitol  2 mcg Intravenous Q T,Th,Sa-HD  . polyethylene glycol  17 g Oral BID  . predniSONE  5 mg Oral Q breakfast  . DISCONTD: labetalol  100 mg Oral BID   Continuous Infusions:  PRN Meds:.sodium chloride, sodium chloride, albumin human, albuterol, bisacodyl, diphenhydrAMINE, heparin, HYDROmorphone (DILAUDID) injection,  lidocaine, lidocaine-prilocaine, nitroGLYCERIN, ondansetron (ZOFRAN) IV, ondansetron, pentafluoroprop-tetrafluoroeth  Assessment & Plan   1. Abdominal pain  CT scan showed findings suggestive of possible acute cholecystitis , however HIDA scan was negative, most likely pain was secondary to ischemic colitis from hyperperfusion caused by low blood pressures during dialysis, GI following and colonic biopsies are consistent with the same. Currently feels better from abdominal pain standpoint , continue bowel regimen as recommended by GI .   2. ESRD (end stage renal disease) - appears to have failure of transplanted kidneys, Renal serviceis following. Cont dialysis TTS.  Cont prednisone 5 mg and cyclosporin 75 mg (dose recently decreased). Outpatient tapering to be continued.   3.Diabetes mellitus - Patient was on insulin pump as outpatient which is currently turned off, started Lantus 25 units daily was added SSI monitor CBGs and adjust PRN . Diabetic coordinator is following.  CBG (last 3)   Basename 08/31/11 1152 08/31/11 0734 08/30/11 2112  GLUCAP 99 168* 146*     Lab Results  Component Value Date   HGBA1C 6.8* 08/23/2011      4. Hypertension: Blood pressure better, continue to monitor blood pressures on the decreased Labetalol dose and will also add parameters to hold for SBP<110 ,HR,60.    5.Obstructive sleep apnea - Continue CPAP, as tolerated by the patient.   6.ACUTE DIASTOLIC CHF (congestive heart failure)/pericardial effusion -worsened by reduced fluid removal during dialysis treatment which was due to his low blood pressures, blood pressure now better patient going for another dialysis treatment on 08/31/2011 as he got more short of breath on 08/31/2011, EKG was unremarkable, chest x-ray was confirmatory of fluid overload, case was discussed by Dr. Lorrene Reid nephrologist who is taking patient for an extra run off dialysis.  Echocardiogram showed moderate pericardial effusion  section fraction of [id number] known diastolic dysfunction , stable repeat echocardiogram with no hemodynamic compromise. Etiology is unclear, Patient with a known history of hypothyroidism, TSH is elevated with normal T4, appreciate cardiology input.    Hypothyroidism:  -Continue Synthroid, elevated TSH but normal free T4 level will keep on current dose of synthroid . Outpatient TSH monitoring.   Anemia -anemia of chronic disease Aranesp as per renal , but her H&H.   Right lower extremity ulcer/chronic erythema:  Wound care follow up.    S/P BKA (below knee amputation)    CODE STATUS: Full code for now however stated that he he does not wish to be on prolonged life support   DVT Prophylaxis SCDs - patient remains noncompliant was counseled about the risks of DVT, PE and death.    Updated wife bedside on 08/31/2011.  Lala Lund  K M.D on 08/31/2011 at 1:56 PM  [facility] Office  [phone]

## 2011-08-31 NOTE — Progress Notes (Signed)
Patient ID: Dale [name], male   DOB: 1966-02-05, 46 y.o.   MRN: [id number] 2 Days Post-Op  Subjective: Pt feels a little worse today throughout abd.  He feels his abd is harder than normal.  He states that some of the solid food makes his pain worse.  Objective: Vital signs in last 24 hours: Temp:  [97.2 F (36.2 C)-98.7 F (37.1 C)] 97.5 F (36.4 C) (05/08 0532) Pulse Rate:  [79-95] 79  (05/08 0532) Resp:  [17-22] 18  (05/08 0532) BP: (81-139)/(48-94) 131/67 mmHg (05/08 0532) SpO2:  [99 %-100 %] 99 % (05/08 0532) Weight:  [261 lb 0.4 oz (118.4 kg)-261 lb 11 oz (118.7 kg)] 261 lb 0.4 oz (118.4 kg) (05/07 1944) Last BM Date: 08/30/11  Intake/Output from previous day: 05/07 0701 - 05/08 0700 In: 440 [P.O.:440] Out: 1069 [Urine:1] Intake/Output this shift:    PE: Abd: soft, diffuse tenderness, +BS, obese  Lab Results:   [facility] 08/31/11 0601 08/30/11 0720  WBC 7.0 8.4  HGB 8.8* 8.6*  HCT 29.7* 28.9*  PLT 185 153   BMET  Basename 08/31/11 0601 08/30/11 0720  NA 132* 132*  K 3.9 4.1  CL 97 98  CO2 28 24  GLUCOSE 175* 218*  BUN 17 35*  CREATININE 3.00* 4.15*  CALCIUM 8.6 8.5   PT/INR No results found for this basename: LABPROT:2,INR:2 in the last 72 hours CMP     Component Value Date/Time   NA 132* 08/31/2011 0601   K 3.9 08/31/2011 0601   CL 97 08/31/2011 0601   CO2 28 08/31/2011 0601   GLUCOSE 175* 08/31/2011 0601   BUN 17 08/31/2011 0601   CREATININE 3.00* 08/31/2011 0601   CALCIUM 8.6 08/31/2011 0601   PROT 7.3 08/28/2011 0630   ALBUMIN 1.9* 08/31/2011 0601   AST 15 08/28/2011 0630   ALT 8 08/28/2011 0630   ALKPHOS 207* 08/28/2011 0630   BILITOT 0.6 08/28/2011 0630   GFRNONAA 24* 08/31/2011 0601   GFRAA 27* 08/31/2011 0601   Lipase     Component Value Date/Time   LIPASE 26 08/27/2011 0820       Studies/Results: No results found.  Anti-infectives: Anti-infectives     Start     Dose/Rate Route Frequency Ordered Stop   08/27/11 2200   piperacillin-tazobactam (ZOSYN) IVPB  2.25 g  Status:  Discontinued        2.25 g 100 mL/hr over 30 Minutes Intravenous 3 times per day 08/27/11 1542 08/29/11 1554   08/27/11 1400  piperacillin-tazobactam (ZOSYN) IVPB 2.25 g       2.25 g 100 mL/hr over 30 Minutes Intravenous  Once 08/27/11 1330 08/27/11 1506           Assessment/Plan  1. Colonic ischemia, likely secondary from low flow state in HD.  2. ESRD  Plan: 1. The pathology results show ischemia of the right colon.  His abdominal pain is likely coming from this colonic ischemia.  Would treat per GI recommendations.  If food makes his pain worse, may want to consider decreasing diet and slowly advancing back up to solid foods. 2. As far as his gallbladder is concerned, it does look different on his recent scan, but we do not feel this is the source of his pain at this time.  I have d/w him that after this resolved with his colon, if he develops RUQ abdominal pain then he may follow up with [place] in our office to consider an elective cholecystectomy.  No surgical intervention needed.  Please call as needed.    LOS: 4 days    Jacquese Cassarino E 08/31/2011

## 2011-09-01 ENCOUNTER — Inpatient Hospital Stay (HOSPITAL_COMMUNITY): Payer: Medicare Other

## 2011-09-01 DIAGNOSIS — D631 Anemia in chronic kidney disease: Secondary | ICD-10-CM

## 2011-09-01 DIAGNOSIS — N186 End stage renal disease: Secondary | ICD-10-CM

## 2011-09-01 DIAGNOSIS — K810 Acute cholecystitis: Secondary | ICD-10-CM

## 2011-09-01 DIAGNOSIS — N039 Chronic nephritic syndrome with unspecified morphologic changes: Secondary | ICD-10-CM

## 2011-09-01 DIAGNOSIS — I318 Other specified diseases of pericardium: Secondary | ICD-10-CM

## 2011-09-01 LAB — RENAL FUNCTION PANEL
Albumin: 2 g/dL — ABNORMAL LOW (ref 3.5–5.2)
BUN: 9 mg/dL (ref 6–23)
CO2: 28 mEq/L (ref 19–32)
Calcium: 8.5 mg/dL (ref 8.4–10.5)
Chloride: 98 mEq/L (ref 96–112)
Creatinine, Ser: 2.41 mg/dL — ABNORMAL HIGH (ref 0.50–1.35)
GFR calc Af Amer: 36 mL/min — ABNORMAL LOW (ref >90)
GFR calc non Af Amer: 31 mL/min — ABNORMAL LOW (ref >90)
Glucose, Bld: 161 mg/dL — ABNORMAL HIGH (ref 70–99)
Phosphorus: 2.8 mg/dL (ref 2.3–4.6)
Potassium: 3.9 mEq/L (ref 3.5–5.1)
Sodium: 135 mEq/L (ref 135–145)

## 2011-09-01 LAB — GLUCOSE, CAPILLARY
Glucose-Capillary: 158 mg/dL — ABNORMAL HIGH (ref 70–99)
Glucose-Capillary: 188 mg/dL — ABNORMAL HIGH (ref 70–99)
Glucose-Capillary: 204 mg/dL — ABNORMAL HIGH (ref 70–99)

## 2011-09-01 LAB — CBC
HCT: 29.2 % — ABNORMAL LOW (ref 39.0–52.0)
Hemoglobin: 8.6 g/dL — ABNORMAL LOW (ref 13.0–17.0)
MCH: 27.6 pg (ref 26.0–34.0)
MCHC: 29.5 g/dL — ABNORMAL LOW (ref 30.0–36.0)
MCV: 93.6 fL (ref 78.0–100.0)
Platelets: 138 10*3/uL — ABNORMAL LOW (ref 150–400)
RBC: 3.12 MIL/uL — ABNORMAL LOW (ref 4.22–5.81)
RDW: 20.8 % — ABNORMAL HIGH (ref 11.5–15.5)
WBC: 6.2 10*3/uL (ref 4.0–10.5)

## 2011-09-01 MED ORDER — ONDANSETRON HCL 4 MG/2ML IJ SOLN
INTRAMUSCULAR | Status: AC
  Administered 2011-09-01: 4 mg via INTRAVENOUS
  Filled 2011-09-01: qty 2

## 2011-09-01 MED ORDER — DIPHENHYDRAMINE HCL 50 MG/ML IJ SOLN
INTRAMUSCULAR | Status: AC
  Administered 2011-09-01: 12.5 mg via INTRAVENOUS
  Filled 2011-09-01: qty 1

## 2011-09-01 MED ORDER — DARBEPOETIN ALFA-POLYSORBATE 100 MCG/0.5ML IJ SOLN
INTRAMUSCULAR | Status: AC
  Administered 2011-09-01: 100 ug via INTRAVENOUS
  Filled 2011-09-01: qty 0.5

## 2011-09-01 MED ORDER — PARICALCITOL 5 MCG/ML IV SOLN
INTRAVENOUS | Status: AC
  Administered 2011-09-01: 2 ug via INTRAVENOUS
  Filled 2011-09-01: qty 1

## 2011-09-01 NOTE — Progress Notes (Signed)
[facility] Daily Rounding Note 09/15/11, 9:10 AM  SUBJECTIVE:       Pain lingers in mid abdomen, not necessarily worse after meals.  Some nausea.  Overall better.  Still using Dilaudid regularly.  Eating about 75% of trays.  Bowels are soft to loose, moving at least once daily  OBJECTIVE:        General: looks chronically unwell.  Ashen coloring     Vital signs in last 24 hours:    Temp:  [97.4 F (36.3 C)-98 F (36.7 C)] 97.8 F (36.6 C) 09/15/2022 0650) Pulse Rate:  [78-90] 86  2022-09-15 0900) Resp:  [14-22] 20  09-15-22 0900) BP: (112-163)/(50-78) 127/63 mmHg 2022/09/15 0900) SpO2:  [96 %-100 %] 98 % 2022/09/15 0521) Weight:  [256 lb 6.3 oz (116.3 kg)-264 lb 8.8 oz (120 kg)] 257 lb 4.4 oz (116.7 kg) 15-Sep-2022 0650) Last BM Date: 08/31/11  Heart: RRR Chest: clear Abdomen: soft, obese, tender mid abdomen,  No guard or rebound.  Active BS  Extremities: left AKA amputation Neuro/Psych:  Pleasant.  Not anxious.   Intake/Output from previous day: 05/08 0701 - 15-Sep-2022 0700 In: 680 [P.O.:680] Out: 2951 [Urine:1; Stool:2]  Intake/Output this shift:    Lab Results:  Basename 09-15-11 0815 08/31/11 1428 08/31/11 0601  WBC 6.2 6.9 7.0  HGB 8.6* 8.8* 8.8*  HCT 29.2* 29.5* 29.7*  PLT 138* 180 185   BMET  Basename 08/31/11 1428 08/31/11 0601 08/30/11 0720  NA 132* 132* 132*  K 3.8 3.9 4.1  CL 95* 97 98  CO2 29 28 24   GLUCOSE 113* 175* 218*  BUN 18 17 35*  CREATININE 3.20* 3.00* 4.15*  CALCIUM 8.8 8.6 8.5   Studies/Results: Dg Chest 2 View 2011-09-15  *RADIOLOGY REPORT*  Clinical Data: Shortness of breath, chest tightness.  CHEST - 2 VIEW  Comparison: 08/31/2011  Findings: Cardiomegaly.  Mild vascular congestion.  Left base atelectasis with small left effusion again noted, unchanged.  No confluent opacity on the right. Right dialysis catheter is unchanged.  IMPRESSION: No significant change.  Original Report Authenticated By: Raelyn Number, M.D.   Dg Chest Port 1 View 08/31/2011   *RADIOLOGY REPORT*  Clinical Data: Shortness of breath and chest pressure.  Dialysis.  PORTABLE CHEST - 1 VIEW  Comparison: 08/23/2011  Findings: Marked cardiac enlargement.  Mild vascular congestion without overt failure or focal infiltrates.  New left base process, likely pleural effusion, with left lower lobe retrocardiac subsegmental atelectasis, not appreciated on priors.  Dialysis catheter good position and unchanged from priors.  No pneumothorax.  IMPRESSION: Marked cardiac enlargement with slight vascular congestion. New left pleural effusion with left base   subsegmental atelectasis.  Original Report Authenticated By: Staci Righter, M.D.    ASSESMENT: 1.  Colonic ischemia, bx confirmed.  Colonoscopy 5/6 with pan colonic pallor and purplish discoloration.  Ongoing abdominal pain and nausea, improved overall. Severe to moderate aorto-iliofemoral and visceral artery atherosclerosis on CT scan. Pain likely to continue so will  need to transition to oral pain control agents.  He is using Dilaudid regularly and pain returns when it wears off.  2.  Thickened GB wall, no GB stones or ductal abnormality on U/S.  ? Acute cholecystitis on CT.  HIDA scan normal. LFTs consistently normal.  Has consistently low Albumin which may be cause of pericholecystic edema/GB wall thickening.   PLAN: 1.  No further GI work up planned.  Will sign off 2.  If pain persists for  Many more weeks, would ask vascular to see.   3.  Try to minimize hypotension, though realize this is difficult to do during dialysis.  4.  Will defer transition to oral narcotics to renal. Internists.    LOS: 5 days   Azucena Freed  09/01/2011, 9:10 AM Pager: [phone]

## 2011-09-01 NOTE — Progress Notes (Signed)
Patient Demographics  Dale [name], is a 46 y.o. male  [id number]  [id number]  DOB - June 08, 1965  Admit date - 08/27/2011  Admitting Physician Silvestre Moment, MD  Outpatient Primary MD for the patient is Windy Kalata, MD, MD  LOS - 5     Chief Complaint  Patient presents with  . Shortness of Breath        Subjective:   Dale [name] today has, No headache, No chest pain, No abdominal pain - No Nausea, No new weakness tingling or numbness, No Cough - SOB is much better, he is having few back cramps.  Objective:   Filed Vitals:   09/01/11 0800 09/01/11 0830 09/01/11 0900 09/01/11 0930  BP: 118/62 124/63 127/63 113/51  Pulse: 81 83 86 90  Temp:      TempSrc:      Resp: 20 20 20 21   Height:      Weight:      SpO2:        Wt Readings from Last 3 Encounters:  09/01/11 116.7 kg (257 lb 4.4 oz)  09/01/11 116.7 kg (257 lb 4.4 oz)  09/01/11 116.7 kg (257 lb 4.4 oz)     Intake/Output Summary (Last 24 hours) at 09/01/11 1049 Last data filed at 09/01/11 0900  Gross per 24 hour  Intake    200 ml  Output   2951 ml  Net  -2751 ml    Exam Awake Alert, Oriented *3, No new F.N deficits, Normal affect [place]ney.AT,PERRAL Supple Neck,No JVD, No cervical lymphadenopathy appriciated.  Symmetrical Chest wall movement, Good air movement bilaterally, 2 bibasilar Rales RRR,No Gallops,Rubs or new Murmurs, No Parasternal Heave +ve B.Sounds, Abd Soft, Non tender, No organomegaly appriciated, No rebound -guarding or rigidity. No Cyanosis, Clubbing or edema, No new Rash or bruise, left BKA  Data Review  CBC  Lab 09/01/11 0815 08/31/11 1428 08/31/11 0601 08/30/11 0720 08/29/11 0545 08/27/11 0820  WBC 6.2 6.9 7.0 8.4 9.3 --  HGB 8.6* 8.8* 8.8* 8.6* 9.0* --  HCT 29.2* 29.5* 29.7* 28.9* 30.0* --  PLT 138* 180 185 153 174 --  MCV 93.6 91.6 92.2 90.6 91.2 --  MCH 27.6 27.3 27.3 27.0 27.4 --  MCHC 29.5* 29.8* 29.6* 29.8* 30.0 --  RDW 20.8* 20.6* 20.7* 20.9* 20.5* --  LYMPHSABS  -- -- -- -- -- 1.6  MONOABS -- -- -- -- -- 0.7  EOSABS -- -- -- -- -- 0.7  BASOSABS -- -- -- -- -- 0.1  BANDABS -- -- -- -- -- --    Chemistries   Lab 09/01/11 0815 08/31/11 1428 08/31/11 0601 08/30/11 0720 08/29/11 0545 08/28/11 0630 08/27/11 0820  NA 135 132* 132* 132* 131* -- --  K 3.9 3.8 3.9 4.1 5.3* -- --  CL 98 95* 97 98 96 -- --  CO2 28 29 28 24 25  -- --  GLUCOSE 161* 113* 175* 218* 371* -- --  BUN 9 18 17  35* 31* -- --  CREATININE 2.41* 3.20* 3.00* 4.15* 3.51* -- --  CALCIUM 8.5 8.8 8.6 8.5 8.6 -- --  MG -- -- -- -- -- -- --  AST -- -- -- -- -- 15 13  ALT -- -- -- -- -- 8 9  ALKPHOS -- -- -- -- -- 207* 197*  BILITOT -- -- -- -- -- 0.6 0.5   ------------------------------------------------------------------------------------------------------------------ estimated creatinine clearance is 48 ml/min (by C-G formula based on Cr of 2.41). ------------------------------------------------------------------------------------------------------------------ No results found for this basename: HGBA1C:2 in the  last 72 hours ------------------------------------------------------------------------------------------------------------------ No results found for this basename: CHOL:2,HDL:2,LDLCALC:2,TRIG:2,CHOLHDL:2,LDLDIRECT:2 in the last 72 hours ------------------------------------------------------------------------------------------------------------------ No results found for this basename: TSH,T4TOTAL,FREET3,T3FREE,THYROIDAB in the last 72 hours ------------------------------------------------------------------------------------------------------------------ No results found for this basename: VITAMINB12:2,FOLATE:2,FERRITIN:2,TIBC:2,IRON:2,RETICCTPCT:2 in the last 72 hours  Coagulation profile No results found for this basename: INR:5,PROTIME:5 in the last 168 hours  No results found for this basename: DDIMER:2 in the last 72 hours  Cardiac Enzymes No results found for this  basename: CK:3,CKMB:3,TROPONINI:3,MYOGLOBIN:3 in the last 168 hours ------------------------------------------------------------------------------------------------------------------ No components found with this basename: POCBNP:3  Micro Results Recent Results (from the past 240 hour(s))  MRSA PCR SCREENING     Status: Normal   Collection Time   08/23/11  9:42 AM      Component Value Range Status Comment   MRSA by PCR NEGATIVE  NEGATIVE  Final     Radiology Reports Ct Abdomen Pelvis Wo Contrast  08/27/2011  *RADIOLOGY REPORT*  Clinical Data: Worsening bilateral lower quadrant abdominal pain. Surgical history includes recent left superficial inguinal lymph node resection, appendectomy, renal transplant, and hernia repair.  CT ABDOMEN AND PELVIS WITHOUT CONTRAST 08/27/2011:  Technique:  Multidetector CT imaging of the abdomen and pelvis was performed following the standard protocol without intravenous contrast.  Comparison: Unenhanced CT abdomen and pelvis 08/22/2011, 08/04/2011.  Findings: Interval slight increase in size of the fluid collection in the left superficial inguinal region (overlying the common femoral artery and vein, not within the left inguinal canal), current measurements approximating 3.9 x 3.9 cm (series 2, image 97), previously 4.1 x 3.3 cm.  The fluid collection has a Hounsfield measurement of 6, indicating simple fluid.  On the prior examination, gas bubbles were present within the collection, and this is in the area of the recent lymph node resection.  Interval development of thickening of the wall of the gallbladder, with edema/inflammation surrounding the gallbladder.  No biliary ductal dilation.  Normal low dose unenhanced appearance of the liver, spleen, and adrenal glands.  Stable mild pancreatic atrophy. Atrophic native kidneys consistent with end-stage renal disease, without focal parenchymal abnormality.  Moderate to severe aorto- iliofemoral and visceral artery  atherosclerosis without aneurysm.  Lymphadenopathy in the retroperitoneum of the abdomen and both sides of the pelvis, unchanged.  Bilateral superficial inguinal lymphadenopathy, unchanged.  No new or enlarging lymphadenopathy. Index left retroperitoneal node approximately 3-4 cm above the aortic bifurcation measures approximately 2.3 x 1.7 cm (series 2, image 46).  Index right retrocrural lymph node is stable at 1.1 cm (series 2, image 19).  Stomach and small bowel normal in appearance.  Distal descending and sigmoid colon diverticulosis without evidence of acute diverticulitis.  Remainder of the colon unremarkable.  Apparent wall thickening in the cecum is felt to be related to decompression.  Lipoma again noted involving the ileocecal valve. No ascites.  Satisfactory appearance of the renal transplant in the left side of the pelvis, without evidence of hydronephrosis or focal parenchymal abnormality.  Urinary bladder unremarkable.  Prostate gland and seminal vesicles normal.  No evidence of recurrent right inguinal hernia.  Diffuse body wall edema.  Bone window images demonstrate degenerative changes involving the lower thoracic and upper lumbar spine.  Interval increase in size of the now large pericardial effusion. Interval increase in size of a small bilateral pleural effusions. Associated mild passive atelectasis in the lower lobes.  IMPRESSION:  1.  Interval slight increase in size of the postoperative seroma or liquefied hematoma in the left superficial inguinal region at the site of prior  lymph node resection. 2.  Stable retroperitoneal and bilateral superficial inguinal lymphadenopathy. 3.  CT findings consistent with acute cholecystitis, new. 4.  Interval increase in size of a now large pericardial effusion. 5.  Interval increase in size of small bilateral pleural effusions. Associated passive atelectasis in the lower lobes, left greater than right. 6.  Distal descending and sigmoid colon diverticulosis  without evidence of acute diverticulitis. 7.  Satisfactory unenhanced appearance of the renal transplant in the left side of the pelvis.  These results were called by telephone on 08/27/2011  at  1210 hours to  Dr. Maryan Rued of the emergency department, who verbally acknowledged these results.  Original Report Authenticated By: Deniece Portela, M.D.   Ct Abdomen Pelvis Wo Contrast  08/22/2011  *RADIOLOGY REPORT*  Clinical Data: Abdominal distention, constipation  CT ABDOMEN AND PELVIS WITHOUT CONTRAST  Technique:  Multidetector CT imaging of the abdomen and pelvis was performed following the standard protocol without intravenous contrast.  Comparison: 08/06/2011  Findings: There is small pericardial effusion measures 7 mm maximum thickness.  Atherosclerotic calcifications of the coronary arteries. Small left pleural effusion with left base posterior atelectasis.  NG tube with tip coiled within stomach.  Sagittal images of the spine shows degenerative changes lower thoracic spine.  The study is limited without IV contrast.  The unenhanced liver is unremarkable. Unenhanced pancreas, spleen and adrenals are unremarkable. Again noted renal atrophy and chronic renal cortical thinning. Vascular calcifications bilateral kidney are stable.  Again noted multiple enlarged retroperitoneal and mesenteric lymph nodes.  A left periaortic lymph node in axial image 37 measures 2 x 1.6 cm.  Moderate stool noted in the right colon.  Small amount of layering sludge  or  tiny gallstones are suspected within dependent gallbladder.  Atherosclerotic calcifications are again noted SMA, splenic artery bilateral renal artery origin.  Again noted a transplanted kidney in the left iliac fossa stable in appearance without evidence of hydronephrosis or hydroureter.  Mild distended urinary bladder.  Oral contrast material was given to the patient.  No small bowel or colonic obstruction.  Enlarged lymph nodes bilateral inguinal region are  again noted.  The largest right inguinal lymph node measures 1.7 x 1.9 cm.  The largest left inguinal lymph node measures 1.7 x 1.5 cm.  There is a fluid collection in the left inguinal region measures 12 HU in attenuation.  This measures 4 x 3.3 cm.  May represent a resolving seroma or hematoma.  There is thickening of the skin anterior abdominal wall and mild subcutaneous anasarca infiltration of the subcutaneous fat.  A small midline lower ventral hernia containing fat measures 3.5 cm only partially visualized.  Postsurgical changes are noted right inguinal region.  No destructive bony lesions are noted within pelvis.  IMPRESSION:  1.  There is small pericardial effusion.  Small left pleural effusion with left base posteriorly atelectasis. 2.  Again noted atrophic native kidneys.  Stable transplanted kidney in the left iliac fossa.  No hydronephrosis or hydroureter. 3.  Again noted retroperitoneal and mesenteric enlarged lymph nodes.  Bilateral enlarged inguinal lymph nodes again noted. 4.  There is a small fluid collection in the left inguinal region measures 4 x 3.3 cm.  This may represent a seroma or resolving hematoma.  Less likely inguinal abscess. 5.  Moderate stool noted in the right colon.  No small bowel or colonic obstruction.  Original Report Authenticated By: Lahoma Crocker, M.D.   Ct Abdomen Pelvis Wo Contrast  08/04/2011  *RADIOLOGY REPORT*  Clinical Data: Recurrent renal failure requiring hemodialysis. History of renal transplant.  CT ABDOMEN AND PELVIS WITHOUT CONTRAST  Technique:  Multidetector CT imaging of the abdomen and pelvis was performed following the standard protocol without intravenous contrast.  Comparison: Right upper quadrant abdominal ultrasound 07/14/2011. Abdominal CT 08/26/2004.  Findings: Images through the lung bases demonstrate cardiomegaly and hemodialysis catheters within the right atrium.  There is vascular congestion and mild bibasilar atelectasis.  No focal abnormalities  are seen within the liver, spleen, gallbladder, pancreas or adrenal glands.  The spleen is slightly larger than on the prior CT, but without focal abnormality.  There is severe chronic cortical thinning of the native kidneys bilaterally.  Appearance is unchanged.  The transplanted kidney in the left iliac fossa appears stable without hydronephrosis, significant cortical thinning or surrounding inflammatory change.  The patient has developed multiple enlarged retroperitoneal and mesenteric lymph nodes.  Examples include an 11 mm right retrocrural node on image 16, a mesenteric node measuring 3.6 x 2.3 cm on image 31, a left periaortic node measuring 2.3 x 3.1 cm on image 46 and a 2.0 x 3.8 cm left external iliac node on image 79. In addition, there are several mildly enlarged lymph nodes within the left groin.  There are postsurgical changes in the right groin without significant adenopathy.  No bowel lesions are identified.  There is no evidence of bowel obstruction.  The bladder contents demonstrate mildly increased density.  There is no evidence of diskitis or other acute osseous abnormality.  IMPRESSION:  1.  Interval development of multi focal lymphadenopathy suspicious for lymphoma.  Enlarged lymph nodes in the left groin are most accessible for excisional biopsy. 2.  Slight interval increase in splenic size without significant splenomegaly or focal abnormality. 3.  Transplanted kidney in the left iliac fossa has a stable appearance without hydronephrosis or perinephric soft tissue stranding.  Chronic atrophy of the native kidneys appears unchanged. 4.  High density urine within the urinary bladder.  Original Report Authenticated By: Vivia Ewing, M.D.   Dg Chest 2 View  08/02/2011  *RADIOLOGY REPORT*  Clinical Data: History of difficulty breathing.  History of fever.  CHEST - 2 VIEW  Comparison: 07/22/2011.  08/01/2011.  Findings: Right sided dialysis catheter has tips in right atrial region.  There is  stable cardiac silhouette enlargement.  There is upper lobe vascular prominence with pulmonary venous hypertension vascular congestion pattern.  No consolidation or definite pleural effusion is seen.  There is slight elevation of the margin of the left hemidiaphragm with minimal basilar atelectasis. On the lateral image there appears to be some posterior atelectasis and patchy infiltrative density without consolidation.  There is minimal degenerative spondylosis.  IMPRESSION: Dialysis catheter in place.  Stable cardiac silhouette enlargement. Pulmonary venous hypertension vascular congestion pattern.  No consolidation or pleural effusion. Left basilar and posterior atelectasis and patchy infiltrative densities without consolidation.  Original Report Authenticated By: Delane Ginger, M.D.   Nm Hepatobiliary Liver Func  08/28/2011  *RADIOLOGY REPORT*  Clinical data: Abdominal pain.  Gallbladder wall thickening on ultrasound.  HEPATOBILIARY SCINTIGRAPHY WITH EJECTION FRACTION  Anterior imaging after5.15mCi Tc99M Choletec IV. There is prompt clearance of the radiopharmaceutical from the blood pool. Timely visualization of activity in central bile ducts, small bowel, and gallbladder. After 1 hour,2.3 mcg CCK was infused intravenously and imaging continued. The patient describes no pain with infusion. The calculated gallbladder ejection fraction over 30 minutes is 86% (normal >30% at  thirty minutes (Ziessman et al.,  1992 J. Nuclear Med).  IMPRESSION 1. Patency of cystic and common bile ducts. 2. Normal gallbladder ejection fraction.  Original Report Authenticated By: Trecia Rogers, M.D.   US Abdomen Complete  08/27/2011  *RADIOLOGY REPORT*  Clinical Data:  Right upper quadrant pain, nausea; history of kidney transplant  ABDOMINAL ULTRASOUND COMPLETE  Comparison:  None.  Findings:  Gallbladder:   No gallstones are identified at but there is gallbladder wall thickening and pericholecystic fluid.  Common Bile Duct:   Within normal limits in caliber. Measures 3 mm.  Liver: No focal mass lesion identified.  Within normal limits in parenchymal echogenicity.  IVC:  Appears normal.  Pancreas:  No abnormality identified.  Spleen:  Within normal limits in size and echotexture.  Right kidney:  Atrophic. No evidence of mass or hydronephrosis.  Left kidney: Atrophic.  No evidence of mass or hydronephrosis.  The left lower quadrant transplanted kidney is not imaged.  Abdominal Aorta:  No aneurysm identified.  IMPRESSION: Acalculous cholecystitis with gallbladder wall thickening and pericholecystic fluid.  Original Report Authenticated By: Duayne Cal, M.D.   US Biopsy  08/05/2011  *RADIOLOGY REPORT*  Clinical history:46 year old with history of renal transplant and fevers.  The patient has lymphadenopathy in the retroperitoneum and left inguinal region.  PROCEDURE(S): ULTRASOUND GUIDED BIOPSY OF LEFT INGUINAL LYMPH NODE.  Physician: Stephan Minister. Henn, MD  Medications:None  Moderate sedation time:None  Fluoroscopy time: None  Procedure:The procedure was explained to the patient.  The risks and benefits of the procedure were discussed and the patient's questions were addressed.  Informed consent was obtained from the patient.  The left inguinal area was evaluated with ultrasound. Enlarged lymph nodes were identified.  Prominent lymph node anterior to the saphenofemoral junction of the left common femoral vein was targeted.  The left groin was prepped and draped in a sterile fashion.  The skin was anesthetized with 1% lidocaine.  18 gauge core needle was directed into the lymph node with ultrasound guidance.  Five core biopsies were obtained.  Samples placed in saline.  Specimens sent for pathology and microbiology.  Findings:Multiple enlarged left inguinal lymph nodes.  Prominent lymph node anterior to the saphenofemoral junction was biopsied. No significant bleeding following the core biopsies.  Complications: None  Impression:Ultrasound  guided core biopsies of a large left inguinal lymph node.  Samples sent for pathology and microbiology.  Original Report Authenticated By: Markus Daft, M.D.   Dg Chest Port 1 View  08/31/2011  *RADIOLOGY REPORT*  Clinical Data: Shortness of breath and chest pressure.  Dialysis.  PORTABLE CHEST - 1 VIEW  Comparison: 08/23/2011  Findings: Marked cardiac enlargement.  Mild vascular congestion without overt failure or focal infiltrates.  New left base process, likely pleural effusion, with left lower lobe retrocardiac subsegmental atelectasis, not appreciated on priors.  Dialysis catheter good position and unchanged from priors.  No pneumothorax.  IMPRESSION: Marked cardiac enlargement with slight vascular congestion. New left pleural effusion with left base   subsegmental atelectasis.  Original Report Authenticated By: Staci Righter, M.D.   Dg Chest Portable 1 View  08/23/2011  *RADIOLOGY REPORT*  Clinical Data: Shortness of breath  PORTABLE CHEST - 1 VIEW  Comparison: 08/03/2011  Findings: Cardiomegaly.  Central vascular congestion. Mild opacity at the left lung base.  Otherwise, no focal consolidation.  No overt edema.  No pleural effusion or pneumothorax.  Large bore right subclavian catheter tip projects over the cavoatrial junction.  No acute osseous abnormality identified.  IMPRESSION: Cardiomegaly.  Vascular congestion without overt edema.  Mild left lung base opacity; atelectasis versus infiltrate.  Original Report Authenticated By: Suanne Marker, M.D.   Dg Chest Port 1 View  08/03/2011  *RADIOLOGY REPORT*  Clinical Data: Cough and shortness of breath.  PORTABLE CHEST - 1 VIEW  Comparison: 08/12/2011.  Findings: The diatek catheter is stable.  The heart is enlarged but stable.  The lungs are better aerated with resolving edema.  No effusions.  IMPRESSION: Slight improved lung aeration.  Vascular congestion without pulmonary edema.  Original Report Authenticated By: P. Kalman Jewels, M.D.   Dg  Chest Port 1 View  08/01/2011  *RADIOLOGY REPORT*  Clinical Data: Central line placement.  PORTABLE CHEST - 1 VIEW  Comparison: 07/22/2011.  Findings: 1504 hours.  Right IJ dialysis catheter tips are present within the right atrium.  There is stable cardiomegaly with chronic vascular congestion.  No confluent airspace opacity, significant pleural effusion or pneumothorax is seen.  IMPRESSION: Dialysis catheter placement without demonstrated pneumothorax. Stable cardiomegaly and vascular congestion.  Original Report Authenticated By: Vivia Ewing, M.D.   Dg Abd Acute W/chest  08/27/2011  *RADIOLOGY REPORT*  Clinical Data: Abdominal pain and shortness of breath.  ACUTE ABDOMEN SERIES (ABDOMEN 2 VIEW & CHEST 1 VIEW)  Comparison: Chest x-ray fourth 32,013.  Findings: The upright chest x-ray demonstrates stable cardiac enlargement.  The right IJ diatek catheter is stable.  There is vascular congestion and mild pulmonary edema with small pleural effusions.  Two views of the abdomen demonstrate an unremarkable bowel gas pattern.  There is scattered air and stool in the colon.  No distended small bowel loops to suggest obstruction.  No free air. The bony structures are intact.  IMPRESSION:  1.  Cardiac enlargement with vascular congestion, mild interstitial edema and small effusions. 2.  No plain film findings for an acute abdominal process.  Original Report Authenticated By: P. Kalman Jewels, M.D.   Dg Abd Acute W/chest  08/22/2011  *RADIOLOGY REPORT*  Clinical Data: Abdominal pain and constipation.  ACUTE ABDOMEN SERIES (ABDOMEN 2 VIEW & CHEST 1 VIEW)  Comparison: Chest x-ray 08/03/2011.  Abdominal x-ray 04/06/2008.  Findings: There is a right internal jugular PermCath with the tips in the right atrium.  Lung volumes are normal.  Minimal dependent bibasilar opacities are favored to reflect areas of subsegmental atelectasis.  No definite pleural effusions.  Mild pulmonary venous congestion without frank  pulmonary edema.  Heart size is mildly enlarged (unchanged).  Mediastinal contours are unremarkable. Atherosclerotic calcifications within the arch of the aorta.  No pneumoperitoneum  Supine and upright views of the abdomen demonstrate gas and stool scattered throughout the colon extending to the level of the distal rectum.  There are some borderline and mildly dilated loops of gas- filled small bowel (up to 4.2 cm in diameter) throughout the central and upper abdomen.  No definite air-fluid levels are noted on the upright projection.  IMPRESSION: 1.  Nonspecific bowel gas pattern, as above.  Findings may simply reflect an ileus, however, clinical correlation for signs and symptoms of early or partial small bowel obstruction is recommended. 2.  No pneumoperitoneum. 3.  No radiographic evidence of acute cardiopulmonary disease. 4.  Mild cardiomegaly is unchanged. 5.  Support apparatus, as above.  Original Report Authenticated By: Etheleen Mayhew, M.D.    Scheduled Meds:    . aspirin  81 mg Oral Daily  . calcium acetate  1,334 mg Oral TID WC  . cycloSPORINE modified  75 mg  Oral BID  . darbepoetin (ARANESP) injection - DIALYSIS  100 mcg Intravenous Q Thu-HD  . feeding supplement  1 Container Oral TID BM  . ferric gluconate (FERRLECIT/NULECIT) IV  125 mg Intravenous Weekly  . insulin aspart  0-5 Units Subcutaneous QHS  . insulin aspart  0-9 Units Subcutaneous TID WC  . insulin glargine  25 Units Subcutaneous Daily  . labetalol  100 mg Oral QHS  . levothyroxine  75 mcg Oral QAC breakfast  . multivitamin  1 tablet Oral Daily  . ondansetron      . paricalcitol  2 mcg Intravenous Q T,Th,Sa-HD  . polyethylene glycol  17 g Oral BID  . predniSONE  5 mg Oral Q breakfast  . DISCONTD: labetalol  100 mg Oral BID   Continuous Infusions:  PRN Meds:.sodium chloride, sodium chloride, albumin human, albuterol, bisacodyl, diphenhydrAMINE, heparin, HYDROmorphone (DILAUDID) injection, lidocaine,  lidocaine-prilocaine, nitroGLYCERIN, ondansetron (ZOFRAN) IV, ondansetron, pentafluoroprop-tetrafluoroeth  Assessment & Plan   1. Abdominal pain  CT scan showed findings suggestive of possible acute cholecystitis , however HIDA scan was negative, most likely pain was secondary to ischemic colitis from hyperperfusion caused by low blood pressures during dialysis, GI following and colonic biopsies are consistent with the same. Currently feels better from abdominal pain standpoint , continue bowel regimen as recommended by GI .   2. ESRD (end stage renal disease) - appears to have failure of transplanted kidneys, Renal serviceis following. Cont dialysis TTS.  Cont prednisone 5 mg and cyclosporin 75 mg (dose recently decreased). Outpatient tapering to be continued.   3.Diabetes mellitus - Patient was on insulin pump as outpatient which is currently turned off, started Lantus 25 units daily was added SSI monitor CBGs and adjust PRN . Diabetic coordinator is following.  CBG (last 3)   Basename 08/31/11 2132 08/31/11 1847 08/31/11 1642  GLUCAP 153* 137* 130*     Lab Results  Component Value Date   HGBA1C 6.8* 08/23/2011      4. Hypertension: Blood pressure better, continue to monitor blood pressures on the decreased Labetalol dose and will also add parameters to hold for SBP<110 ,HR,60.    5.Obstructive sleep apnea - Continue CPAP, as tolerated by the patient.   6.ACUTE DIASTOLIC CHF (congestive heart failure)/pericardial effusion -worsened by reduced fluid removal during dialysis treatment which was due to his low blood pressures, pressures have been better patient back on dialysis schedule, had extra dialysis on 08/31/2011 for excess fluid removal, now shortness of breath is completely resolved, cardiology following, pericardial effusion of no hemodynamic significance per cardiology.    7.Hypothyroidism:   -Continue Synthroid, elevated TSH but normal free T4 level will keep on  current dose of synthroid . Outpatient TSH monitoring.   8.Anemia -anemia of chronic disease -  Aranesp as per renal ,  H&H is stable.   9.Right lower extremity ulcer/chronic erythema & S/P BKA (below knee amputation)   Wound care follow up.       CODE STATUS: Full code for now however stated that he he does not wish to be on prolonged life support   DVT Prophylaxis SCDs - patient remains noncompliant was counseled about the risks of DVT, PE and death.    Updated wife bedside on 08/31/2011.  [name] M.D on 09/01/2011 at 10:49 AM  [facility] Office  [phone]

## 2011-09-01 NOTE — Progress Notes (Signed)
[facility] Progress Note  Subjective:  Back in HD today for additional volume removal and to get back on usual schedule of TTS Got off about 4 Kg with yesterday's dialysis treatment Feels less SOB Abd still very "sore"  Eats but small amounts Objective Filed Vitals:   08/31/11 1900 08/31/11 2029 09/01/11 0521 09/01/11 0650  BP: 143/50 136/67 143/68 147/75  Pulse: 90 87 85 80  Temp: 97.9 F (36.6 C) 98 F (36.7 C) 97.4 F (36.3 C) 97.8 F (36.6 C)  TempSrc: Oral Oral Oral Oral  Resp: 22 20 20 20   Height:  5\' 8"  (1.727 m)    Weight:  116.3 kg (256 lb 6.3 oz)  116.7 kg (257 lb 4.4 oz)  SpO2: 100% 96% 98%    (weight 120 pre HD yesterday)  Physical Exam BP 147/75  Pulse 80  Temp(Src) 97.8 F (36.6 C) (Oral)  Resp 20  Ht 5\' 8"  (1.727 m)  Wt 116.7 kg (257 lb 4.4 oz)  BMI 39.12 kg/m2  SpO2 98% General: Obese WM Heart:Distant Ht sounds No pericardial rub Lungs: Diminished but fewer crackles Abdomen:Protuberant; mildly diffusely tender Extremities:BKA leg softer; RLE still with 2+edema, chronic skin changes Dialysis Access: PC plus maturing right AV Additional Objective  Basename 08/31/11 1428 08/31/11 0601  NA 132* 132*  K 3.8 3.9  CL 95* 97  CO2 29 28  GLUCOSE 113* 175*  BUN 18 17  CREATININE 3.20* 3.00*  CALCIUM 8.8 8.6  MG -- --  PHOS 3.2 3.6  CBC:  Basename 08/31/11 1428 08/31/11 0601  WBC 6.9 7.0  NEUTROABS -- --  HGB 8.8* 8.8*  HCT 29.5* 29.7*  MCV 91.6 92.2  PLT 180 185   Labs: Dg Chest Port 1 View  08/31/2011  *RADIOLOGY REPORT*  Clinical Data: Shortness of breath and chest pressure.  Dialysis.  PORTABLE CHEST - 1 VIEW  Comparison: 08/23/2011  Findings: Marked cardiac enlargement.  Mild vascular congestion without overt failure or focal infiltrates.  New left base process, likely pleural effusion, with left lower lobe retrocardiac subsegmental atelectasis, not appreciated on priors.  Dialysis catheter good position and unchanged from  priors.  No pneumothorax.  IMPRESSION: Marked cardiac enlargement with slight vascular congestion. New left pleural effusion with left base   subsegmental atelectasis.  Original Report Authenticated By: Staci Righter, M.D.   Medications:   . aspirin  81 mg Oral Daily  . calcium acetate  1,334 mg Oral TID WC  . cycloSPORINE modified  75 mg Oral BID  . darbepoetin (ARANESP) injection - DIALYSIS  100 mcg Intravenous Q Thu-HD  . feeding supplement  1 Container Oral TID BM  . ferric gluconate (FERRLECIT/NULECIT) IV  125 mg Intravenous Weekly  . insulin aspart  0-5 Units Subcutaneous QHS  . insulin aspart  0-9 Units Subcutaneous TID WC  . insulin glargine  25 Units Subcutaneous Daily  . labetalol  100 mg Oral QHS  . levothyroxine  75 mcg Oral QAC breakfast  . multivitamin  1 tablet Oral Daily  . paricalcitol  2 mcg Intravenous Q T,Th,Sa-HD  . polyethylene glycol  17 g Oral BID  . predniSONE  5 mg Oral Q breakfast  . DISCONTD: labetalol  100 mg Oral BID   Assessment/Plans:  1. Pericardial effusion - incidental finding during screening echo for possible surgery; no evidence of tamponade; cardiology following; repeat 2D echo showed a moderate, free-flowing pericardial effusion circumferential to theheart. There was no evidence of hemodynamic compromise. However if we  continue to experience issues with low BP on HD may have to be addressed further  2. Abdominal pain/Constipation/Ischemic Colitis- colonoscopy with tics and questionable area of ischemic change possible; surgery following (right colon biopsies are consistent with ischemia) Will try to avoid low BP with HD   3. ESRD/s/p failed transplant - (TTS SWAF);on cyclosporine 75 bid and prednisone 5; Required extra dialysis yesterday due to volume overload and CHF; today is getting additional UF and is back on usual schedule.  4. Anemia -  Aranesp 136mcg qwk and weekly IV Fe; follow trend   5. Secondary hyperparathyroidism -  on phoslo;  diet intake poor; cont Zemplar @ 2 mcg q HD;follow ca/phos closely   6. HTN/volume - hypotensive during HD; recent decreased dose labetalol; pain meds factor; incidental pericardial effusion on Echo; use albumin 25% prn during HD and cont attempt max UF  7. Nutrition - albumin 1.9; diet advanced; has gastroparesis and some nausea; ^ high protein diet and encourage more intake w/ resource (^ protein CL supplement)  8. DM/gastroparesis- (has insulin pump at home); CBG and SSI while inpt; HgbA1c 6.8%; not getting his reglan; defer to GI vs primary following   9. Venous/Vascular insufficiency- wound care following   10. Hypothyroidism- on synthroid; TSH 5.48 on adm; defer primary   12. OSA/Obesity- wears CPAP   13. Disposition- per primary services  Shelagh Rayman B 09/01/2011,7:14 AM  LOS: 5 days

## 2011-09-01 NOTE — Progress Notes (Signed)
Patient seen and I agree with the above documentation, including the assessment and plan.

## 2011-09-01 NOTE — Procedures (Signed)
I have personally attended this patient's dialysis session.  Goal today is 2-3 liters Pre weight about 116 kg (down 4 kg with yesterday's treatment) BFR 400.  Permacath.    Dale [name] B

## 2011-09-01 NOTE — Progress Notes (Signed)
Pt off unit

## 2011-09-02 ENCOUNTER — Encounter (HOSPITAL_COMMUNITY): Payer: Self-pay | Admitting: Radiology

## 2011-09-02 ENCOUNTER — Inpatient Hospital Stay (HOSPITAL_COMMUNITY): Payer: Medicare Other

## 2011-09-02 DIAGNOSIS — N039 Chronic nephritic syndrome with unspecified morphologic changes: Secondary | ICD-10-CM

## 2011-09-02 DIAGNOSIS — N186 End stage renal disease: Secondary | ICD-10-CM

## 2011-09-02 DIAGNOSIS — K810 Acute cholecystitis: Secondary | ICD-10-CM

## 2011-09-02 DIAGNOSIS — I318 Other specified diseases of pericardium: Secondary | ICD-10-CM

## 2011-09-02 DIAGNOSIS — D631 Anemia in chronic kidney disease: Secondary | ICD-10-CM

## 2011-09-02 LAB — URINALYSIS, ROUTINE W REFLEX MICROSCOPIC
Glucose, UA: 250 mg/dL — AB
Ketones, ur: 15 mg/dL — AB
Nitrite: NEGATIVE
Protein, ur: >300 mg/dL — AB
Specific Gravity, Urine: 1.022 (ref 1.005–1.030)
Urobilinogen, UA: 0.2 mg/dL (ref 0.0–1.0)
pH: 5.5 (ref 5.0–8.0)

## 2011-09-02 LAB — CBC
HCT: 30.6 % — ABNORMAL LOW (ref 39.0–52.0)
Hemoglobin: 9 g/dL — ABNORMAL LOW (ref 13.0–17.0)
MCH: 27.4 pg (ref 26.0–34.0)
MCHC: 29.4 g/dL — ABNORMAL LOW (ref 30.0–36.0)
MCV: 93 fL (ref 78.0–100.0)
Platelets: 173 10*3/uL (ref 150–400)
RBC: 3.29 MIL/uL — ABNORMAL LOW (ref 4.22–5.81)
RDW: 20.8 % — ABNORMAL HIGH (ref 11.5–15.5)
WBC: 7 10*3/uL (ref 4.0–10.5)

## 2011-09-02 LAB — URINE MICROSCOPIC-ADD ON

## 2011-09-02 LAB — DIFFERENTIAL
Basophils Absolute: 0.1 10*3/uL (ref 0.0–0.1)
Basophils Relative: 1 % (ref 0–1)
Eosinophils Absolute: 0.6 10*3/uL (ref 0.0–0.7)
Eosinophils Relative: 9 % — ABNORMAL HIGH (ref 0–5)
Lymphocytes Relative: 27 % (ref 12–46)
Lymphs Abs: 1.9 10*3/uL (ref 0.7–4.0)
Monocytes Absolute: 0.8 10*3/uL (ref 0.1–1.0)
Monocytes Relative: 11 % (ref 3–12)
Neutro Abs: 3.7 10*3/uL (ref 1.7–7.7)
Neutrophils Relative %: 53 % (ref 43–77)

## 2011-09-02 LAB — GLUCOSE, CAPILLARY
Glucose-Capillary: 100 mg/dL — ABNORMAL HIGH (ref 70–99)
Glucose-Capillary: 130 mg/dL — ABNORMAL HIGH (ref 70–99)
Glucose-Capillary: 107 mg/dL — ABNORMAL HIGH (ref 70–99)
Glucose-Capillary: 164 mg/dL — ABNORMAL HIGH (ref 70–99)
Glucose-Capillary: 60 mg/dL — ABNORMAL LOW (ref 70–99)
Glucose-Capillary: 136 mg/dL — ABNORMAL HIGH (ref 70–99)

## 2011-09-02 LAB — FUNGUS CULTURE W SMEAR: Fungal Smear: NONE SEEN

## 2011-09-02 MED ORDER — IOHEXOL 300 MG/ML  SOLN
20.0000 mL | INTRAMUSCULAR | Status: AC
  Administered 2011-09-02 (×2): 20 mL via ORAL

## 2011-09-02 MED ORDER — PIPERACILLIN-TAZOBACTAM IN DEX 2-0.25 GM/50ML IV SOLN
2.2500 g | Freq: Three times a day (TID) | INTRAVENOUS | Status: DC
  Administered 2011-09-02 – 2011-09-06 (×10): 2.25 g via INTRAVENOUS
  Filled 2011-09-02 (×13): qty 50

## 2011-09-02 MED ORDER — CALCIUM ACETATE 667 MG PO CAPS
667.0000 mg | ORAL_CAPSULE | Freq: Three times a day (TID) | ORAL | Status: DC
  Administered 2011-09-02 – 2011-09-06 (×11): 667 mg via ORAL
  Filled 2011-09-02 (×15): qty 1

## 2011-09-02 MED ORDER — PIPERACILLIN-TAZOBACTAM IN DEX 2-0.25 GM/50ML IV SOLN
2.2500 g | Freq: Three times a day (TID) | INTRAVENOUS | Status: DC
  Filled 2011-09-02 (×3): qty 50

## 2011-09-02 MED ORDER — IOHEXOL 300 MG/ML  SOLN
100.0000 mL | Freq: Once | INTRAMUSCULAR | Status: AC | PRN
  Administered 2011-09-02: 100 mL via INTRAVENOUS

## 2011-09-02 NOTE — Evaluation (Signed)
Physical Therapy Evaluation Patient Details Name: Dale [name] MRN: [id number] DOB: 1965-05-14 Today's Date: 09/02/2011 Time: [id number] PT Time Calculation (min): 20 min  PT Assessment / Plan / Recommendation Clinical Impression  Pt adm with abdominal pain.  Pt also with pericardial effusion.  Pt needs skilled PT to maximize I and safety to improve quality of life and decr burden of care.  Recommend HHPT after DC.    PT Assessment  Patient needs continued PT services    Follow Up Recommendations  Home health PT;Supervision - Intermittent    Barriers to Discharge        lEquipment Recommendations  None recommended by PT    Recommendations for Other Services     Frequency Min 3X/week    Precautions / Restrictions Precautions Precautions: Fall Required Braces or Orthoses: Other Brace/Splint Other Brace/Splint: LLE prosthesis   Pertinent Vitals/Pain Pt on 2L/O2.  Unable to get SaO2 reading but pt felt SOB off of O2.      Mobility  Bed Mobility Supine to Sit: 6: Modified independent (Device/Increase time) Sitting - Scoot to Edge of Bed: 6: Modified independent (Device/Increase time) Transfers Sit to Stand: 4: Min assist;With upper extremity assist;From bed Stand to Sit: 4: Min assist;With upper extremity assist;To bed Details for Transfer Assistance: Assist to bring hips up. Ambulation/Gait Ambulation/Gait Assistance: 4: Min assist Ambulation Distance (Feet): 75 Feet Assistive device: Rolling walker Ambulation/Gait Assistance Details: verbal cues to stay closer to walker Gait Pattern: Decreased stride length;Trunk flexed    Exercises     PT Diagnosis: Difficulty walking;Generalized weakness  PT Problem List: Decreased strength;Decreased knowledge of use of DME;Decreased activity tolerance;Decreased balance;Decreased mobility PT Treatment Interventions: DME instruction;Gait training;Functional mobility training;Patient/family education;Therapeutic activities;Therapeutic  exercise;Balance training   PT Goals Acute Rehab PT Goals PT Goal Formulation: With patient Potential to Achieve Goals: Good Pt will go Sit to Stand: with modified independence PT Goal: Sit to Stand - Progress: Goal set today Pt will Transfer Bed to Chair/Chair to Bed: with modified independence PT Transfer Goal: Bed to Chair/Chair to Bed - Progress: Goal set today Pt will Ambulate: 51 - 150 feet;with supervision;with least restrictive assistive device PT Goal: Ambulate - Progress: Goal set today  Visit Information  Last PT Received On: 09/02/11 Assistance Needed: +1    Subjective Data  Subjective: Pt states he has been in the hospital most of the last 5 weeks. Patient Stated Goal: Be able to walk without assisstance or assistive device.   Prior Functioning  Home Living Lives With: Spouse Available Help at Discharge: Available PRN/intermittently Type of Home: House Home Access: Ramped entrance Home Layout: One level Bathroom Shower/Tub: Tub/shower unit;Curtain Biochemist, clinical: Standard Bathroom Accessibility: Yes How Accessible: Accessible via wheelchair;Accessible via walker Home Adaptive Equipment: Walker - rolling Prior Function Level of Independence: Independent with assistive device(s) Driving: No Vocation: On disability Comments: Was amb with prosthesis without any assistive device until recent illnesses.  Uses w/c as well. Communication Communication: No difficulties Dominant Hand: Right    Cognition  Overall Cognitive Status: Appears within functional limits for tasks assessed/performed Arousal/Alertness: Awake/alert Orientation Level: Oriented X4 / Intact Behavior During Session: [facility] for tasks performed    Extremity/Trunk Assessment Right Lower Extremity Assessment RLE ROM/Strength/Tone: Deficits RLE ROM/Strength/Tone Deficits: grossly 4/5 Left Lower Extremity Assessment LLE ROM/Strength/Tone: Deficits LLE ROM/Strength/Tone Deficits: grossly 4/5     Balance Static Sitting Balance Static Sitting - Balance Support: No upper extremity supported Static Sitting - Level of Assistance: 7: Independent Dynamic Sitting Balance Dynamic Sitting -  Balance Support: No upper extremity supported;Feet supported Dynamic Sitting - Level of Assistance: 7: Independent Static Standing Balance Static Standing - Balance Support: Bilateral upper extremity supported (on walker) Static Standing - Level of Assistance: 4: Min assist  End of Session PT - End of Session Equipment Utilized During Treatment: Gait belt;Left lower extremity prosthesis Activity Tolerance: Patient limited by fatigue Patient left: in bed;with call bell/phone within reach;with family/visitor present (Pt sitting EOB with lunch) Nurse Communication: Mobility status   Shaquia Berkley 09/02/2011, 12:33 PM  [facility] PT [phone]

## 2011-09-02 NOTE — Progress Notes (Deleted)
Subjective:  Co  Some pain at right lower abd, "site of my transplant", no dysuria, ate brk. Sat up in chair 38min. Has not walked ,wants to try with PT.  No sob, " I  Signed off hd early 40 min  yesterday because of back cramps" Objective Vital signs in last 24 hours: Filed Vitals:   09/01/11 1725 09/01/11 2200 09/01/11 2223 09/02/11 0600  BP: 129/57 160/80  163/77  Pulse: 86 86  79  Temp: 97.9 F (36.6 C) 98.3 F (36.8 C)  97.9 F (36.6 C)  TempSrc: Oral Oral  Oral  Resp: 17 24  20   Height:      Weight:   114.5 kg (252 lb 6.8 oz)   SpO2: 100% 99%  98%   Weight change: -5.5 kg (-12 lb 2 oz)  Intake/Output Summary (Last 24 hours) at 09/02/11 0921 Last data filed at 09/01/11 1800  Gross per 24 hour  Intake    160 ml  Output   1817 ml  Net  -1657 ml   Labs: Basic Metabolic Panel:  Lab [id number] 0815 08/31/11 1428 08/31/11 0601  NA 135 132* 132*  K 3.9 3.8 3.9  CL 98 95* 97  CO2 28 29 28   GLUCOSE 161* 113* 175*  BUN 9 18 17   CREATININE 2.41* 3.20* 3.00*  CALCIUM 8.5 8.8 8.6  ALB -- -- --  PHOS 2.8 3.2 3.6   Liver Function Tests:  Lab 09/01/11 0815 08/31/11 1428 08/31/11 0601 08/28/11 0630 08/27/11 0820  AST -- -- -- 15 13  ALT -- -- -- 8 9  ALKPHOS -- -- -- 207* 197*  BILITOT -- -- -- 0.6 0.5  PROT -- -- -- 7.3 6.9  ALBUMIN 2.0* 2.0* 1.9* -- --    Lab 08/27/11 0820  LIPASE 26  AMYLASE --   No results found for this basename: AMMONIA:3 in the last 168 hours CBC:  Lab 09/01/11 0815 08/31/11 1428 08/31/11 0601 08/30/11 0720 08/29/11 0545 08/27/11 0820  WBC 6.2 6.9 7.0 -- -- --  NEUTROABS -- -- -- -- -- 3.7  HGB 8.6* 8.8* 8.8* -- -- --  HCT 29.2* 29.5* 29.7* -- -- --  MCV 93.6 91.6 92.2 90.6 91.2 --  PLT 138* 180 185 -- -- --   Cardiac Enzymes: No results found for this basename: CKTOTAL:5,CKMB:5,CKMBINDEX:5,TROPONINI:5 in the last 168 hours CBG:  Lab 09/02/11 0749 09/02/11 0601 09/02/11 0212 09/01/11 2152 09/01/11 1719  GLUCAP 107* 130* 100* 204* 188*      Iron Studies: No results found for this basename: IRON,TIBC,TRANSFERRIN,FERRITIN in the last 72 hours Studies/Results: Dg Chest 2 View  09/01/2011  *RADIOLOGY REPORT*  Clinical Data: Shortness of breath, chest tightness.  CHEST - 2 VIEW  Comparison: 08/31/2011  Findings: Cardiomegaly.  Mild vascular congestion.  Left base atelectasis with small left effusion again noted, unchanged.  No confluent opacity on the right. Right dialysis catheter is unchanged.  IMPRESSION: No significant change.  Original Report Authenticated By: Raelyn Number, M.D.   Dg Chest Port 1 View  08/31/2011  *RADIOLOGY REPORT*  Clinical Data: Shortness of breath and chest pressure.  Dialysis.  PORTABLE CHEST - 1 VIEW  Comparison: 08/23/2011  Findings: Marked cardiac enlargement.  Mild vascular congestion without overt failure or focal infiltrates.  New left base process, likely pleural effusion, with left lower lobe retrocardiac subsegmental atelectasis, not appreciated on priors.  Dialysis catheter good position and unchanged from priors.  No pneumothorax.  IMPRESSION: Marked cardiac enlargement with slight  vascular congestion. New left pleural effusion with left base   subsegmental atelectasis.  Original Report Authenticated By: Staci Righter, M.D.   Medications:      . aspirin  81 mg Oral Daily  . calcium acetate  1,334 mg Oral TID WC  . cycloSPORINE modified  75 mg Oral BID  . darbepoetin (ARANESP) injection - DIALYSIS  100 mcg Intravenous Q Thu-HD  . feeding supplement  1 Container Oral TID BM  . ferric gluconate (FERRLECIT/NULECIT) IV  125 mg Intravenous Weekly  . insulin aspart  0-5 Units Subcutaneous QHS  . insulin aspart  0-9 Units Subcutaneous TID WC  . insulin glargine  25 Units Subcutaneous Daily  . labetalol  100 mg Oral QHS  . levothyroxine  75 mcg Oral QAC breakfast  . multivitamin  1 tablet Oral Daily  . paricalcitol  2 mcg Intravenous Q T,Th,Sa-HD  . polyethylene glycol  17 g Oral BID  .  predniSONE  5 mg Oral Q breakfast   I  have reviewed scheduled and prn medications.  Physical Exam: General: alert, NAD Heart: RRR/ with distant sounds no rub heard Lungs: Sl  Decreased sounds,bases, but clear Abdomen: BS+ and nl. Protuberant,diffusely tender more at RLQ Extremities: Dialysis Access: + B/T R UA AVF, 1+ bipedal edema, , chronic skin changes with ant .lower leg dressing on skin tear.   Problem/Plan: 1. Pericardial effusion = no evidence of tamponade; cardiology following; repeat 2D echo showed a moderate, free-flowing pericardial effusion circumferential to theheart. There was no evidence of hemodynamic compromise.  If patient has problems with hypotension with dialysis,will need further reck. HD yesterday with no bp drop. 2.2 kg off.   2. Abdominal pain/Constipation/Ischemic Colitis- colonoscopy with tics and questionable area of ischemic change possible; surgery following (right colon biopsies are consistent with ischemia)Today pain at kid. tx will check UA/cs. Avoid low bp on hd. HIDA SCAN NEG.Moderate stool in Right Colon dw pt. takin g some sorbital. 3. ESRD/s/p failed transplant - (TTS SWAF);on cyclosporine 75 bid and prednisone 5; Required extra dialysis yesterday due to volume overload and CHF;  2.2 kg off and bp did not drop. Attempt 3 to 4 liters in am sat  Hd. Use albumin for bp support/uf keep sbp  110 or higher. 4. Anemia - Aranesp 159mcg qwk and weekly IV Fe; follow trend 8.6 hgb yesterday 5. Secondary hyperparathyroidism - on phoslo; diet intake poor; cont Zemplar @ 2 mcg q HD;follow ca/phos closely  Yesterday phos 2.8 Phoslo 2 decr. To 1  6. HTN/volume - hypotensive during HD; recent decreased dose labetalol; pain meds factor; incidental pericardial effusion on Echo; use albumin 25% prn during HD and cont attempt max UF  7. Nutrition - albumin 1.9; diet advanced; has gastroparesis and some nausea; ^ high protein diet and encourage more intake w/ resource (^ protein  CL supplement)  8. DM/gastroparesis- (has insulin pump at home); CBG and SSI while inpt; HgbA1c 6.8%; not getting his reglan; GI signed off yesterday.  9. Venous/Vascular insufficiency- wound care following  10. Hypothyroidism- on synthroid; TSH 5.48 on adm; defer primary  12. OSA/Obesity- wears CPAP    Ernest Haber, PA-C [facility] [phone] 09/02/2011,9:21 AM  LOS: 6 days

## 2011-09-02 NOTE — Progress Notes (Signed)
Subjective:  Co  Some pain at right lower abd, "site of my transplant", no dysuria, ate brk. Sat up in chair 34min. Has not walked ,wants to try with PT.  No sob, " I  Signed off hd early 40 min  yesterday because of back cramps" Objective Vital signs in last 24 hours: Filed Vitals:   09/01/11 2200 09/01/11 2223 09/02/11 0600 09/02/11 0931  BP: 160/80  163/77 163/80  Pulse: 86  79 87  Temp: 98.3 F (36.8 C)  97.9 F (36.6 C) 97.5 F (36.4 C)  TempSrc: Oral  Oral Oral  Resp: 24  20 19   Height:      Weight:  114.5 kg (252 lb 6.8 oz)    SpO2: 99%  98% 95%   Weight change: -5.5 kg (-12 lb 2 oz)  Intake/Output Summary (Last 24 hours) at 09/02/11 1004 Last data filed at 09/02/11 0931  Gross per 24 hour  Intake    400 ml  Output   1817 ml  Net  -1417 ml   Labs: Basic Metabolic Panel:  Lab [id number] 0815 08/31/11 1428 08/31/11 0601  NA 135 132* 132*  K 3.9 3.8 3.9  CL 98 95* 97  CO2 28 29 28   GLUCOSE 161* 113* 175*  BUN 9 18 17   CREATININE 2.41* 3.20* 3.00*  CALCIUM 8.5 8.8 8.6  ALB -- -- --  PHOS 2.8 3.2 3.6   Liver Function Tests:  Lab 09/01/11 0815 08/31/11 1428 08/31/11 0601 08/28/11 0630 08/27/11 0820  AST -- -- -- 15 13  ALT -- -- -- 8 9  ALKPHOS -- -- -- 207* 197*  BILITOT -- -- -- 0.6 0.5  PROT -- -- -- 7.3 6.9  ALBUMIN 2.0* 2.0* 1.9* -- --    Lab 08/27/11 0820  LIPASE 26  AMYLASE --   No results found for this basename: AMMONIA:3 in the last 168 hours CBC:  Lab 09/01/11 0815 08/31/11 1428 08/31/11 0601 08/30/11 0720 08/29/11 0545 08/27/11 0820  WBC 6.2 6.9 7.0 -- -- --  NEUTROABS -- -- -- -- -- 3.7  HGB 8.6* 8.8* 8.8* -- -- --  HCT 29.2* 29.5* 29.7* -- -- --  MCV 93.6 91.6 92.2 90.6 91.2 --  PLT 138* 180 185 -- -- --   Cardiac Enzymes: No results found for this basename: CKTOTAL:5,CKMB:5,CKMBINDEX:5,TROPONINI:5 in the last 168 hours CBG:  Lab 09/02/11 0749 09/02/11 0601 09/02/11 0212 09/01/11 2152 09/01/11 1719  GLUCAP 107* 130* 100* 204* 188*      Iron Studies: No results found for this basename: IRON,TIBC,TRANSFERRIN,FERRITIN in the last 72 hours Studies/Results: Dg Chest 2 View  09/01/2011  *RADIOLOGY REPORT*  Clinical Data: Shortness of breath, chest tightness.  CHEST - 2 VIEW  Comparison: 08/31/2011  Findings: Cardiomegaly.  Mild vascular congestion.  Left base atelectasis with small left effusion again noted, unchanged.  No confluent opacity on the right. Right dialysis catheter is unchanged.  IMPRESSION: No significant change.  Original Report Authenticated By: Raelyn Number, M.D.   Dg Chest Port 1 View  08/31/2011  *RADIOLOGY REPORT*  Clinical Data: Shortness of breath and chest pressure.  Dialysis.  PORTABLE CHEST - 1 VIEW  Comparison: 08/23/2011  Findings: Marked cardiac enlargement.  Mild vascular congestion without overt failure or focal infiltrates.  New left base process, likely pleural effusion, with left lower lobe retrocardiac subsegmental atelectasis, not appreciated on priors.  Dialysis catheter good position and unchanged from priors.  No pneumothorax.  IMPRESSION: Marked cardiac enlargement with slight  vascular congestion. New left pleural effusion with left base   subsegmental atelectasis.  Original Report Authenticated By: Staci Righter, M.D.   Medications:      . aspirin  81 mg Oral Daily  . calcium acetate  667 mg Oral TID WC  . cycloSPORINE modified  75 mg Oral BID  . darbepoetin (ARANESP) injection - DIALYSIS  100 mcg Intravenous Q Thu-HD  . feeding supplement  1 Container Oral TID BM  . ferric gluconate (FERRLECIT/NULECIT) IV  125 mg Intravenous Weekly  . insulin aspart  0-5 Units Subcutaneous QHS  . insulin aspart  0-9 Units Subcutaneous TID WC  . insulin glargine  25 Units Subcutaneous Daily  . labetalol  100 mg Oral QHS  . levothyroxine  75 mcg Oral QAC breakfast  . multivitamin  1 tablet Oral Daily  . paricalcitol  2 mcg Intravenous Q T,Th,Sa-HD  . polyethylene glycol  17 g Oral BID  . predniSONE   5 mg Oral Q breakfast  . DISCONTD: calcium acetate  1,334 mg Oral TID WC   I  have reviewed scheduled and prn medications.  Physical Exam: General: alert, NAD Heart: RRR/ with distant sounds no rub heard Lungs: Sl  Decreased sounds,bases, but clear Abdomen: BS+ and nl. Protuberant,diffusely tender more at RLQ Extremities: Dialysis Access: + B/T R UA AVF, 1+ bipedal edema, , chronic skin changes with ant .lower leg dressing on skin tear.   Problem/Plan: 1. Pericardial effusion = no evidence of tamponade; cardiology following; repeat 2D echo showed a moderate, free-flowing pericardial effusion circumferential to theheart. There was no evidence of hemodynamic compromise.  If patient has problems with hypotension with dialysis,will need further reck. HD yesterday with no bp drop. 2.2 kg off.   2. Abdominal pain/Constipation/Ischemic Colitis- colonoscopy with tics and questionable area of ischemic change possible; surgery following (right colon biopsies are consistent with ischemia)Today pain at kid. tx will check UA/cs. Avoid low bp on hd. HIDA SCAN NEG. 3. ESRD/s/p failed transplant - (TTS SWAF);on cyclosporine 75 bid and prednisone 5; Required extra dialysis yesterday due to volume overload and CHF;  2.2 kg off and bp did not drop. Attempt 3 to 4 liters in am sat  Hd. Use albumin for bp support/uf keep sbp  110 or higher. 4. Anemia - Aranesp 165mcg qwk and weekly IV Fe; follow trend 8.6 hgb yesterday 5. Secondary hyperparathyroidism - on phoslo; diet intake poor; cont Zemplar @ 2 mcg q HD;follow ca/phos closely  Yesterday phos 2.8 Phoslo 2 decr. To 1  6. HTN/volume - hypotensive during HD; recent decreased dose labetalol; pain meds factor; incidental pericardial effusion on Echo; use albumin 25% prn during HD and cont attempt max UF  7. Nutrition - albumin 1.9; diet advanced; has gastroparesis and some nausea; ^ high protein diet and encourage more intake w/ resource (^ protein CL supplement)    8. DM/gastroparesis- (has insulin pump at home); CBG and SSI while inpt; HgbA1c 6.8%; not getting his reglan; GI signed off yesterday.  9. Venous/Vascular insufficiency- wound care following  10. Hypothyroidism- on synthroid; TSH 5.48 on adm; defer primary  12. OSA/Obesity- wears CPAP    Ernest Haber, PA-C [facility] [phone] 09/02/2011,10:04 AM  LOS: 6 days

## 2011-09-02 NOTE — Progress Notes (Signed)
ANTIBIOTIC CONSULT NOTE - INITIAL  Pharmacy Consult for Zosyn Indication: Empiric coverage for possible infection of L kidney transplant  Allergies  Allergen Reactions  . Amoxicillin Other (See Comments)    Reaction unknown  . Ciprofloxacin Other (See Comments)    Reaction unknown  . Clindamycin/Lincomycin Other (See Comments)    Reaction unknown  . Codeine Other (See Comments)    Reaction unknown  . Levofloxacin Other (See Comments)    Reaction unknown  . Morphine And Related     nausea  . Vasotec Cough    Patient Measurements: Height: 5\' 8"  (172.7 cm) Weight: 252 lb 6.8 oz (114.5 kg) IBW/kg (Calculated) : 68.4   Vital Signs: Temp: 98.4 F (36.9 C) (05/10 1353) Temp src: Oral (05/10 1353) BP: 151/69 mmHg (05/10 1353) Pulse Rate: 82  (05/10 1353) Intake/Output from previous day: 05/09 0701 - 05/10 0700 In: 160 [P.O.:120] Out: 1817  Intake/Output from this shift: Total I/O In: 480 [P.O.:480] Out: -   Labs:  Basename 09/02/11 1135 09/01/11 0815 08/31/11 1428 08/31/11 0601  WBC 7.0 6.2 6.9 --  HGB 9.0* 8.6* 8.8* --  PLT 173 138* 180 --  LABCREA -- -- -- --  CREATININE -- 2.41* 3.20* 3.00*   Estimated Creatinine Clearance: 47.5 ml/min (by C-G formula based on Cr of 2.41). No results found for this basename: VANCOTROUGH:2,VANCOPEAK:2,VANCORANDOM:2,GENTTROUGH:2,GENTPEAK:2,GENTRANDOM:2,TOBRATROUGH:2,TOBRAPEAK:2,TOBRARND:2,AMIKACINPEAK:2,AMIKACINTROU:2,AMIKACIN:2, in the last 72 hours   Microbiology: Recent Results (from the past 720 hour(s))  URINE CULTURE     Status: Normal   Collection Time   08/04/11  4:11 PM      Component Value Range Status Comment   Specimen Description URINE, RANDOM   Final    Special Requests NONE   Final    Culture  Setup Time [id number]   Final    Colony Count >=100,000 COLONIES/ML   Final    Culture YEAST   Final    Report Status 08/06/2011 FINAL   Final   AFB CULTURE WITH SMEAR     Status: Normal (Preliminary result)   Collection Time   08/05/11  4:52 PM      Component Value Range Status Comment   Specimen Description TISSUE LYMPH NODE   Final    Special Requests NONE   Final    ACID FAST SMEAR NO ACID FAST BACILLI SEEN   Final    Culture     Final    Value: CULTURE WILL BE EXAMINED FOR 6 WEEKS BEFORE ISSUING A FINAL REPORT   Report Status PENDING   Incomplete   FUNGUS CULTURE W SMEAR     Status: Normal   Collection Time   08/05/11  4:52 PM      Component Value Range Status Comment   Specimen Description TISSUE LYMPH NODE   Final    Special Requests NONE   Final    Fungal Smear NO YEAST OR FUNGAL ELEMENTS SEEN   Final    Culture No Fungi Isolated in 4 Weeks   Final    Report Status 09/02/2011 FINAL   Final   TISSUE CULTURE     Status: Normal   Collection Time   08/05/11  4:52 PM      Component Value Range Status Comment   Specimen Description TISSUE LYMPH NODE   Final    Special Requests NONE   Final    Gram Stain     Final    Value: NO WBC SEEN     NO SQUAMOUS EPITHELIAL CELLS SEEN  NO ORGANISMS SEEN   Culture NO GROWTH 3 DAYS   Final    Report Status 08/09/2011 FINAL   Final   MRSA PCR SCREENING     Status: Normal   Collection Time   08/23/11  9:42 AM      Component Value Range Status Comment   MRSA by PCR NEGATIVE  NEGATIVE  Final     Medical History: Past Medical History  Diagnosis Date  . Insulin dependent diabetes mellitus   . Hypertension   . History of blood transfusion   . ESRD (end stage renal disease)     a. 1995 s/p cadaveric transplant w/ susbequent failure after 18 yrs;  b.Dialysis initiated 07/2011  . Dyspnea     a. 07/23/11 Echo: EF 55-60%  . Anemia of chronic disease   . Cholecystitis     a. 08/27/2011  . Constipation   . Pericardial effusion     a.  Small by CT 08/22/11;  b.  Large by CT 08/27/11  . Inguinal lymphadenopathy     a. bilateral - s/p biopsy 07/2011    Assessment: 46 y.o. M with ESRD to resume empiric Zosyn for possible infection of L kidney transplant.  The patient has an allergy to amoxicillin as "rash" however the patient has tolerated Zosyn earlier this admission. The patient receives HD T/Th/Sat. Afebrile, WBC WNL. Urine Cx pending.   Goal of Therapy:  Eradication of Infection  Plan:  1. Zosyn 2.25g IV every 8 hours 2. Will monitor for any s/sx of rash 3. Will continue to follow renal function, culture results, LOT, and antibiotic de-escalation plans   Alycia Rossetti, PharmD, BCPS Clinical Pharmacist Pager: [phone] 09/02/2011 3:38 PM

## 2011-09-02 NOTE — Progress Notes (Signed)
Patient Demographics  Dale [name], is a 46 y.o. male  [id number]  [id number]  DOB - 1966-04-05  Admit date - 08/27/2011  Admitting Physician Silvestre Moment, MD  Outpatient Primary MD for the patient is Windy Kalata, MD, MD  LOS - 6     Chief Complaint  Patient presents with  . Shortness of Breath        Subjective:   Dale [name] today has, No headache, No chest pain,  No Nausea, No new weakness tingling or numbness, No Cough - SOB is much better, on 09/02/2011 he is having pain at his left kidney transplant site.  Objective:   Filed Vitals:   09/01/11 2200 09/01/11 2223 09/02/11 0600 09/02/11 0931  BP: 160/80  163/77 163/80  Pulse: 86  79 87  Temp: 98.3 F (36.8 C)  97.9 F (36.6 C) 97.5 F (36.4 C)  TempSrc: Oral  Oral Oral  Resp: 24  20 19   Height:      Weight:  114.5 kg (252 lb 6.8 oz)    SpO2: 99%  98% 95%    Wt Readings from Last 3 Encounters:  09/01/11 114.5 kg (252 lb 6.8 oz)  09/01/11 114.5 kg (252 lb 6.8 oz)  09/01/11 114.5 kg (252 lb 6.8 oz)     Intake/Output Summary (Last 24 hours) at 09/02/11 1052 Last data filed at 09/02/11 0931  Gross per 24 hour  Intake    400 ml  Output      0 ml  Net    400 ml    Exam Awake Alert, Oriented *3, No new F.N deficits, Normal affect Bliss.AT,PERRAL Supple Neck,No JVD, No cervical lymphadenopathy appriciated.  Symmetrical Chest wall movement, Good air movement bilaterally, 2 bibasilar Rales RRR,No Gallops,Rubs or new Murmurs, No Parasternal Heave +ve B.Sounds, Abd Soft but maybe slightly distended, No organomegaly appriciated, No rebound -guarding or rigidity, and left renal transplant site is little tender and warm No Cyanosis, Clubbing or edema, No new Rash or bruise, left BKA  Data Review  CBC  Lab 09/01/11 0815 08/31/11 1428 08/31/11 0601 08/30/11 0720 08/29/11 0545 08/27/11 0820  WBC 6.2 6.9 7.0 8.4 9.3 --  HGB 8.6* 8.8* 8.8* 8.6* 9.0* --  HCT 29.2* 29.5* 29.7* 28.9* 30.0* --  PLT  138* 180 185 153 174 --  MCV 93.6 91.6 92.2 90.6 91.2 --  MCH 27.6 27.3 27.3 27.0 27.4 --  MCHC 29.5* 29.8* 29.6* 29.8* 30.0 --  RDW 20.8* 20.6* 20.7* 20.9* 20.5* --  LYMPHSABS -- -- -- -- -- 1.6  MONOABS -- -- -- -- -- 0.7  EOSABS -- -- -- -- -- 0.7  BASOSABS -- -- -- -- -- 0.1  BANDABS -- -- -- -- -- --    Chemistries   Lab 09/01/11 0815 08/31/11 1428 08/31/11 0601 08/30/11 0720 08/29/11 0545 08/28/11 0630 08/27/11 0820  NA 135 132* 132* 132* 131* -- --  K 3.9 3.8 3.9 4.1 5.3* -- --  CL 98 95* 97 98 96 -- --  CO2 28 29 28 24 25  -- --  GLUCOSE 161* 113* 175* 218* 371* -- --  BUN 9 18 17  35* 31* -- --  CREATININE 2.41* 3.20* 3.00* 4.15* 3.51* -- --  CALCIUM 8.5 8.8 8.6 8.5 8.6 -- --  MG -- -- -- -- -- -- --  AST -- -- -- -- -- 15 13  ALT -- -- -- -- -- 8 9  ALKPHOS -- -- -- -- -- 207* 197*  BILITOT -- -- -- -- --  0.6 0.5   ------------------------------------------------------------------------------------------------------------------ estimated creatinine clearance is 47.5 ml/min (by C-G formula based on Cr of 2.41). ------------------------------------------------------------------------------------------------------------------ No results found for this basename: HGBA1C:2 in the last 72 hours ------------------------------------------------------------------------------------------------------------------ No results found for this basename: CHOL:2,HDL:2,LDLCALC:2,TRIG:2,CHOLHDL:2,LDLDIRECT:2 in the last 72 hours ------------------------------------------------------------------------------------------------------------------ No results found for this basename: TSH,T4TOTAL,FREET3,T3FREE,THYROIDAB in the last 72 hours ------------------------------------------------------------------------------------------------------------------ No results found for this basename: VITAMINB12:2,FOLATE:2,FERRITIN:2,TIBC:2,IRON:2,RETICCTPCT:2 in the last 72 hours  Coagulation profile No  results found for this basename: INR:5,PROTIME:5 in the last 168 hours  No results found for this basename: DDIMER:2 in the last 72 hours  Cardiac Enzymes No results found for this basename: CK:3,CKMB:3,TROPONINI:3,MYOGLOBIN:3 in the last 168 hours ------------------------------------------------------------------------------------------------------------------ No components found with this basename: POCBNP:3  Micro Results No results found for this or any previous visit (from the past 240 hour(s)).  Radiology Reports Ct Abdomen Pelvis Wo Contrast  08/27/2011  *RADIOLOGY REPORT*  Clinical Data: Worsening bilateral lower quadrant abdominal pain. Surgical history includes recent left superficial inguinal lymph node resection, appendectomy, renal transplant, and hernia repair.  CT ABDOMEN AND PELVIS WITHOUT CONTRAST 08/27/2011:  Technique:  Multidetector CT imaging of the abdomen and pelvis was performed following the standard protocol without intravenous contrast.  Comparison: Unenhanced CT abdomen and pelvis 08/22/2011, 08/04/2011.  Findings: Interval slight increase in size of the fluid collection in the left superficial inguinal region (overlying the common femoral artery and vein, not within the left inguinal canal), current measurements approximating 3.9 x 3.9 cm (series 2, image 97), previously 4.1 x 3.3 cm.  The fluid collection has a Hounsfield measurement of 6, indicating simple fluid.  On the prior examination, gas bubbles were present within the collection, and this is in the area of the recent lymph node resection.  Interval development of thickening of the wall of the gallbladder, with edema/inflammation surrounding the gallbladder.  No biliary ductal dilation.  Normal low dose unenhanced appearance of the liver, spleen, and adrenal glands.  Stable mild pancreatic atrophy. Atrophic native kidneys consistent with end-stage renal disease, without focal parenchymal abnormality.  Moderate to  severe aorto- iliofemoral and visceral artery atherosclerosis without aneurysm.  Lymphadenopathy in the retroperitoneum of the abdomen and both sides of the pelvis, unchanged.  Bilateral superficial inguinal lymphadenopathy, unchanged.  No new or enlarging lymphadenopathy. Index left retroperitoneal node approximately 3-4 cm above the aortic bifurcation measures approximately 2.3 x 1.7 cm (series 2, image 46).  Index right retrocrural lymph node is stable at 1.1 cm (series 2, image 19).  Stomach and small bowel normal in appearance.  Distal descending and sigmoid colon diverticulosis without evidence of acute diverticulitis.  Remainder of the colon unremarkable.  Apparent wall thickening in the cecum is felt to be related to decompression.  Lipoma again noted involving the ileocecal valve. No ascites.  Satisfactory appearance of the renal transplant in the left side of the pelvis, without evidence of hydronephrosis or focal parenchymal abnormality.  Urinary bladder unremarkable.  Prostate gland and seminal vesicles normal.  No evidence of recurrent right inguinal hernia.  Diffuse body wall edema.  Bone window images demonstrate degenerative changes involving the lower thoracic and upper lumbar spine.  Interval increase in size of the now large pericardial effusion. Interval increase in size of a small bilateral pleural effusions. Associated mild passive atelectasis in the lower lobes.  IMPRESSION:  1.  Interval slight increase in size of the postoperative seroma or liquefied hematoma in the left superficial inguinal region at the site of prior lymph node resection. 2.  Stable retroperitoneal  and bilateral superficial inguinal lymphadenopathy. 3.  CT findings consistent with acute cholecystitis, new. 4.  Interval increase in size of a now large pericardial effusion. 5.  Interval increase in size of small bilateral pleural effusions. Associated passive atelectasis in the lower lobes, left greater than right. 6.   Distal descending and sigmoid colon diverticulosis without evidence of acute diverticulitis. 7.  Satisfactory unenhanced appearance of the renal transplant in the left side of the pelvis.  These results were called by telephone on 08/27/2011  at  1210 hours to  Dr. Maryan Rued of the emergency department, who verbally acknowledged these results.  Original Report Authenticated By: Deniece Portela, M.D.   Ct Abdomen Pelvis Wo Contrast  08/22/2011  *RADIOLOGY REPORT*  Clinical Data: Abdominal distention, constipation  CT ABDOMEN AND PELVIS WITHOUT CONTRAST  Technique:  Multidetector CT imaging of the abdomen and pelvis was performed following the standard protocol without intravenous contrast.  Comparison: 08/06/2011  Findings: There is small pericardial effusion measures 7 mm maximum thickness.  Atherosclerotic calcifications of the coronary arteries. Small left pleural effusion with left base posterior atelectasis.  NG tube with tip coiled within stomach.  Sagittal images of the spine shows degenerative changes lower thoracic spine.  The study is limited without IV contrast.  The unenhanced liver is unremarkable. Unenhanced pancreas, spleen and adrenals are unremarkable. Again noted renal atrophy and chronic renal cortical thinning. Vascular calcifications bilateral kidney are stable.  Again noted multiple enlarged retroperitoneal and mesenteric lymph nodes.  A left periaortic lymph node in axial image 37 measures 2 x 1.6 cm.  Moderate stool noted in the right colon.  Small amount of layering sludge  or  tiny gallstones are suspected within dependent gallbladder.  Atherosclerotic calcifications are again noted SMA, splenic artery bilateral renal artery origin.  Again noted a transplanted kidney in the left iliac fossa stable in appearance without evidence of hydronephrosis or hydroureter.  Mild distended urinary bladder.  Oral contrast material was given to the patient.  No small bowel or colonic obstruction.   Enlarged lymph nodes bilateral inguinal region are again noted.  The largest right inguinal lymph node measures 1.7 x 1.9 cm.  The largest left inguinal lymph node measures 1.7 x 1.5 cm.  There is a fluid collection in the left inguinal region measures 12 HU in attenuation.  This measures 4 x 3.3 cm.  May represent a resolving seroma or hematoma.  There is thickening of the skin anterior abdominal wall and mild subcutaneous anasarca infiltration of the subcutaneous fat.  A small midline lower ventral hernia containing fat measures 3.5 cm only partially visualized.  Postsurgical changes are noted right inguinal region.  No destructive bony lesions are noted within pelvis.  IMPRESSION:  1.  There is small pericardial effusion.  Small left pleural effusion with left base posteriorly atelectasis. 2.  Again noted atrophic native kidneys.  Stable transplanted kidney in the left iliac fossa.  No hydronephrosis or hydroureter. 3.  Again noted retroperitoneal and mesenteric enlarged lymph nodes.  Bilateral enlarged inguinal lymph nodes again noted. 4.  There is a small fluid collection in the left inguinal region measures 4 x 3.3 cm.  This may represent a seroma or resolving hematoma.  Less likely inguinal abscess. 5.  Moderate stool noted in the right colon.  No small bowel or colonic obstruction.  Original Report Authenticated By: Lahoma Crocker, M.D.   Ct Abdomen Pelvis Wo Contrast  08/04/2011  *RADIOLOGY REPORT*  Clinical Data: Recurrent renal failure requiring  hemodialysis. History of renal transplant.  CT ABDOMEN AND PELVIS WITHOUT CONTRAST  Technique:  Multidetector CT imaging of the abdomen and pelvis was performed following the standard protocol without intravenous contrast.  Comparison: Right upper quadrant abdominal ultrasound 07/14/2011. Abdominal CT 08/26/2004.  Findings: Images through the lung bases demonstrate cardiomegaly and hemodialysis catheters within the right atrium.  There is vascular congestion and  mild bibasilar atelectasis.  No focal abnormalities are seen within the liver, spleen, gallbladder, pancreas or adrenal glands.  The spleen is slightly larger than on the prior CT, but without focal abnormality.  There is severe chronic cortical thinning of the native kidneys bilaterally.  Appearance is unchanged.  The transplanted kidney in the left iliac fossa appears stable without hydronephrosis, significant cortical thinning or surrounding inflammatory change.  The patient has developed multiple enlarged retroperitoneal and mesenteric lymph nodes.  Examples include an 11 mm right retrocrural node on image 16, a mesenteric node measuring 3.6 x 2.3 cm on image 31, a left periaortic node measuring 2.3 x 3.1 cm on image 46 and a 2.0 x 3.8 cm left external iliac node on image 79. In addition, there are several mildly enlarged lymph nodes within the left groin.  There are postsurgical changes in the right groin without significant adenopathy.  No bowel lesions are identified.  There is no evidence of bowel obstruction.  The bladder contents demonstrate mildly increased density.  There is no evidence of diskitis or other acute osseous abnormality.  IMPRESSION:  1.  Interval development of multi focal lymphadenopathy suspicious for lymphoma.  Enlarged lymph nodes in the left groin are most accessible for excisional biopsy. 2.  Slight interval increase in splenic size without significant splenomegaly or focal abnormality. 3.  Transplanted kidney in the left iliac fossa has a stable appearance without hydronephrosis or perinephric soft tissue stranding.  Chronic atrophy of the native kidneys appears unchanged. 4.  High density urine within the urinary bladder.  Original Report Authenticated By: Vivia Ewing, M.D.   Dg Chest 2 View  08/02/2011  *RADIOLOGY REPORT*  Clinical Data: History of difficulty breathing.  History of fever.  CHEST - 2 VIEW  Comparison: 07/22/2011.  08/01/2011.  Findings: Right sided dialysis  catheter has tips in right atrial region.  There is stable cardiac silhouette enlargement.  There is upper lobe vascular prominence with pulmonary venous hypertension vascular congestion pattern.  No consolidation or definite pleural effusion is seen.  There is slight elevation of the margin of the left hemidiaphragm with minimal basilar atelectasis. On the lateral image there appears to be some posterior atelectasis and patchy infiltrative density without consolidation.  There is minimal degenerative spondylosis.  IMPRESSION: Dialysis catheter in place.  Stable cardiac silhouette enlargement. Pulmonary venous hypertension vascular congestion pattern.  No consolidation or pleural effusion. Left basilar and posterior atelectasis and patchy infiltrative densities without consolidation.  Original Report Authenticated By: Delane Ginger, M.D.   Nm Hepatobiliary Liver Func  08/28/2011  *RADIOLOGY REPORT*  Clinical data: Abdominal pain.  Gallbladder wall thickening on ultrasound.  HEPATOBILIARY SCINTIGRAPHY WITH EJECTION FRACTION  Anterior imaging after5.50mCi Tc99M Choletec IV. There is prompt clearance of the radiopharmaceutical from the blood pool. Timely visualization of activity in central bile ducts, small bowel, and gallbladder. After 1 hour,2.3 mcg CCK was infused intravenously and imaging continued. The patient describes no pain with infusion. The calculated gallbladder ejection fraction over 30 minutes is 86% (normal >30% at  thirty minutes [facility] et al., 1992 J. Nuclear Med).  IMPRESSION  1. Patency of cystic and common bile ducts. 2. Normal gallbladder ejection fraction.  Original Report Authenticated By: Trecia Rogers, M.D.   US Abdomen Complete  08/27/2011  *RADIOLOGY REPORT*  Clinical Data:  Right upper quadrant pain, nausea; history of kidney transplant  ABDOMINAL ULTRASOUND COMPLETE  Comparison:  None.  Findings:  Gallbladder:   No gallstones are identified at but there is gallbladder wall  thickening and pericholecystic fluid.  Common Bile Duct:  Within normal limits in caliber. Measures 3 mm.  Liver: No focal mass lesion identified.  Within normal limits in parenchymal echogenicity.  IVC:  Appears normal.  Pancreas:  No abnormality identified.  Spleen:  Within normal limits in size and echotexture.  Right kidney:  Atrophic. No evidence of mass or hydronephrosis.  Left kidney: Atrophic.  No evidence of mass or hydronephrosis.  The left lower quadrant transplanted kidney is not imaged.  Abdominal Aorta:  No aneurysm identified.  IMPRESSION: Acalculous cholecystitis with gallbladder wall thickening and pericholecystic fluid.  Original Report Authenticated By: Duayne Cal, M.D.   US Biopsy  08/05/2011  *RADIOLOGY REPORT*  Clinical history:46 year old with history of renal transplant and fevers.  The patient has lymphadenopathy in the retroperitoneum and left inguinal region.  PROCEDURE(S): ULTRASOUND GUIDED BIOPSY OF LEFT INGUINAL LYMPH NODE.  Physician: Stephan Minister. Henn, MD  Medications:None  Moderate sedation time:None  Fluoroscopy time: None  Procedure:The procedure was explained to the patient.  The risks and benefits of the procedure were discussed and the patient's questions were addressed.  Informed consent was obtained from the patient.  The left inguinal area was evaluated with ultrasound. Enlarged lymph nodes were identified.  Prominent lymph node anterior to the saphenofemoral junction of the left common femoral vein was targeted.  The left groin was prepped and draped in a sterile fashion.  The skin was anesthetized with 1% lidocaine.  18 gauge core needle was directed into the lymph node with ultrasound guidance.  Five core biopsies were obtained.  Samples placed in saline.  Specimens sent for pathology and microbiology.  Findings:Multiple enlarged left inguinal lymph nodes.  Prominent lymph node anterior to the saphenofemoral junction was biopsied. No significant bleeding following the  core biopsies.  Complications: None  Impression:Ultrasound guided core biopsies of a large left inguinal lymph node.  Samples sent for pathology and microbiology.  Original Report Authenticated By: Markus Daft, M.D.   Dg Chest Port 1 View  08/31/2011  *RADIOLOGY REPORT*  Clinical Data: Shortness of breath and chest pressure.  Dialysis.  PORTABLE CHEST - 1 VIEW  Comparison: 08/23/2011  Findings: Marked cardiac enlargement.  Mild vascular congestion without overt failure or focal infiltrates.  New left base process, likely pleural effusion, with left lower lobe retrocardiac subsegmental atelectasis, not appreciated on priors.  Dialysis catheter good position and unchanged from priors.  No pneumothorax.  IMPRESSION: Marked cardiac enlargement with slight vascular congestion. New left pleural effusion with left base   subsegmental atelectasis.  Original Report Authenticated By: Staci Righter, M.D.   Dg Chest Portable 1 View  08/23/2011  *RADIOLOGY REPORT*  Clinical Data: Shortness of breath  PORTABLE CHEST - 1 VIEW  Comparison: 08/03/2011  Findings: Cardiomegaly.  Central vascular congestion. Mild opacity at the left lung base.  Otherwise, no focal consolidation.  No overt edema.  No pleural effusion or pneumothorax.  Large bore right subclavian catheter tip projects over the cavoatrial junction.  No acute osseous abnormality identified.  IMPRESSION: Cardiomegaly.  Vascular congestion without overt edema.  Mild left lung base opacity; atelectasis versus infiltrate.  Original Report Authenticated By: Suanne Marker, M.D.   Dg Chest Port 1 View  08/03/2011  *RADIOLOGY REPORT*  Clinical Data: Cough and shortness of breath.  PORTABLE CHEST - 1 VIEW  Comparison: 08/12/2011.  Findings: The diatek catheter is stable.  The heart is enlarged but stable.  The lungs are better aerated with resolving edema.  No effusions.  IMPRESSION: Slight improved lung aeration.  Vascular congestion without pulmonary edema.  Original  Report Authenticated By: P. Kalman Jewels, M.D.   Dg Chest Port 1 View  08/01/2011  *RADIOLOGY REPORT*  Clinical Data: Central line placement.  PORTABLE CHEST - 1 VIEW  Comparison: 07/22/2011.  Findings: 1504 hours.  Right IJ dialysis catheter tips are present within the right atrium.  There is stable cardiomegaly with chronic vascular congestion.  No confluent airspace opacity, significant pleural effusion or pneumothorax is seen.  IMPRESSION: Dialysis catheter placement without demonstrated pneumothorax. Stable cardiomegaly and vascular congestion.  Original Report Authenticated By: Vivia Ewing, M.D.   Dg Abd Acute W/chest  08/27/2011  *RADIOLOGY REPORT*  Clinical Data: Abdominal pain and shortness of breath.  ACUTE ABDOMEN SERIES (ABDOMEN 2 VIEW & CHEST 1 VIEW)  Comparison: Chest x-ray fourth 32,013.  Findings: The upright chest x-ray demonstrates stable cardiac enlargement.  The right IJ diatek catheter is stable.  There is vascular congestion and mild pulmonary edema with small pleural effusions.  Two views of the abdomen demonstrate an unremarkable bowel gas pattern.  There is scattered air and stool in the colon.  No distended small bowel loops to suggest obstruction.  No free air. The bony structures are intact.  IMPRESSION:  1.  Cardiac enlargement with vascular congestion, mild interstitial edema and small effusions. 2.  No plain film findings for an acute abdominal process.  Original Report Authenticated By: P. Kalman Jewels, M.D.   Dg Abd Acute W/chest  08/22/2011  *RADIOLOGY REPORT*  Clinical Data: Abdominal pain and constipation.  ACUTE ABDOMEN SERIES (ABDOMEN 2 VIEW & CHEST 1 VIEW)  Comparison: Chest x-ray 08/03/2011.  Abdominal x-ray 04/06/2008.  Findings: There is a right internal jugular PermCath with the tips in the right atrium.  Lung volumes are normal.  Minimal dependent bibasilar opacities are favored to reflect areas of subsegmental atelectasis.  No definite pleural effusions.   Mild pulmonary venous congestion without frank pulmonary edema.  Heart size is mildly enlarged (unchanged).  Mediastinal contours are unremarkable. Atherosclerotic calcifications within the arch of the aorta.  No pneumoperitoneum  Supine and upright views of the abdomen demonstrate gas and stool scattered throughout the colon extending to the level of the distal rectum.  There are some borderline and mildly dilated loops of gas- filled small bowel (up to 4.2 cm in diameter) throughout the central and upper abdomen.  No definite air-fluid levels are noted on the upright projection.  IMPRESSION: 1.  Nonspecific bowel gas pattern, as above.  Findings may simply reflect an ileus, however, clinical correlation for signs and symptoms of early or partial small bowel obstruction is recommended. 2.  No pneumoperitoneum. 3.  No radiographic evidence of acute cardiopulmonary disease. 4.  Mild cardiomegaly is unchanged. 5.  Support apparatus, as above.  Original Report Authenticated By: Etheleen Mayhew, M.D.    Scheduled Meds:    . aspirin  81 mg Oral Daily  . calcium acetate  667 mg Oral TID WC  . cycloSPORINE modified  75 mg Oral BID  . darbepoetin (ARANESP)  injection - DIALYSIS  100 mcg Intravenous Q Thu-HD  . feeding supplement  1 Container Oral TID BM  . ferric gluconate (FERRLECIT/NULECIT) IV  125 mg Intravenous Weekly  . insulin aspart  0-5 Units Subcutaneous QHS  . insulin aspart  0-9 Units Subcutaneous TID WC  . insulin glargine  25 Units Subcutaneous Daily  . labetalol  100 mg Oral QHS  . levothyroxine  75 mcg Oral QAC breakfast  . multivitamin  1 tablet Oral Daily  . paricalcitol  2 mcg Intravenous Q T,Th,Sa-HD  . polyethylene glycol  17 g Oral BID  . predniSONE  5 mg Oral Q breakfast  . DISCONTD: calcium acetate  1,334 mg Oral TID WC   Continuous Infusions:  PRN Meds:.sodium chloride, sodium chloride, albumin human, albuterol, bisacodyl, diphenhydrAMINE, heparin, HYDROmorphone (DILAUDID)  injection, lidocaine, lidocaine-prilocaine, nitroGLYCERIN, ondansetron (ZOFRAN) IV, ondansetron, pentafluoroprop-tetrafluoroeth  Assessment & Plan   1. Abdominal pain  CT scan showed findings suggestive of possible acute cholecystitis , however HIDA scan was negative, most likely pain was secondary to ischemic colitis from hyperperfusion caused by low blood pressures during dialysis, GI following and colonic biopsies are consistent with the same. Currently feels better from abdominal pain standpoint , continue bowel regimen as recommended by GI .  Patient has repeat left-sided abdominal pain at the site of his transplant kidney on 09/02/2011, I have discussed his case in detail with Dr. Idolina Primer him patient's dialysis physician and nephrologist, who agrees that patient will benefit from CT scan of abdomen and pelvis with oral and IV contrast, patient has ESRD with failed renal transplant with no possibility of salvage. This was explained to patient and his family bedside who agreed with the plan. We'll also order urine analysis and culture with clean catch urine specimen he should still makes little urine. Antibiotics will be added if needed, will like to also evaluate his GI tract at this time with CT scan due to history of ischemic colitis recently.   2. ESRD (end stage renal disease) - appears to have failure of transplanted kidneys, Renal serviceis following. Cont dialysis TTS.  Cont prednisone 5 mg and cyclosporin 75 mg (dose recently decreased). Outpatient tapering to be continued.    3.Diabetes mellitus - Patient was on insulin pump as outpatient which is currently turned off, started Lantus 25 units daily was added SSI monitor CBGs and adjust PRN . Diabetic coordinator is following.  CBG (last 3)   Basename 09/02/11 0749 09/02/11 0601 09/02/11 0212  GLUCAP 107* 130* 100*     Lab Results  Component Value Date   HGBA1C 6.8* 08/23/2011      4. Hypertension: Blood pressure better,  continue to monitor blood pressures on the decreased Labetalol dose and will also add parameters to hold for SBP<110,HR,60.     5.Obstructive sleep apnea - Continue CPAP, as tolerated by the patient.    6.ACUTE DIASTOLIC CHF (congestive heart failure)/pericardial effusion -worsened by reduced fluid removal during dialysis treatment which was due to his low blood pressures, pressures have been better patient back on dialysis schedule, had extra dialysis on 08/31/2011 for excess fluid removal, now shortness of breath is completely resolved, cardiology following, pericardial effusion of no hemodynamic significance per cardiology.     7.Hypothyroidism:   -Continue Synthroid, elevated TSH but normal free T4 level will keep on current dose of synthroid . Outpatient TSH monitoring.    8.Anemia -anemia of chronic disease -  Aranesp as per renal ,  H&H is stable.  9.Right lower extremity ulcer/chronic erythema & S/P BKA (below knee amputation)   Wound care follow up.       CODE STATUS: Full code for now however stated that he he does not wish to be on prolonged life support   DVT Prophylaxis SCDs - patient remains noncompliant was counseled about the risks of DVT, PE and death.    Updated wife bedside on 08/31/2011.  [name] M.D on 09/02/2011 at 10:52 AM  [facility] Office  [phone]

## 2011-09-02 NOTE — Progress Notes (Signed)
@   Subjective:  No chest pain; mild dyspnea; lower abdominal pain persists   Objective:  Filed Vitals:   09/01/11 1725 09/01/11 2200 09/01/11 2223 09/02/11 0600  BP: 129/57 160/80  163/77  Pulse: 86 86  79  Temp: 97.9 F (36.6 C) 98.3 F (36.8 C)  97.9 F (36.6 C)  TempSrc: Oral Oral  Oral  Resp: 17 24  20   Height:      Weight:   114.5 kg (252 lb 6.8 oz)   SpO2: 100% 99%  98%    Intake/Output from previous day:  Intake/Output Summary (Last 24 hours) at 09/02/11 0759 Last data filed at 09/01/11 1800  Gross per 24 hour  Intake    160 ml  Output   1817 ml  Net  -1657 ml    Physical Exam: Physical exam: Well-developed well-nourished in no acute distress.  Skin is warm and dry.  HEENT is normal.  Neck is supple. JVD difficult to assess Chest with diminished BS bases Cardiovascular exam is regular rate and rhythm.  Abdominal exam tender to palpation particularly in low abdomen, abdominal wall edema Extremities s/p left BKA; trace edema and chronic skin changes RLE neuro grossly intact    Lab Results: Basic Metabolic Panel:  Basename 09/01/11 0815 08/31/11 1428  NA 135 132*  K 3.9 3.8  CL 98 95*  CO2 28 29  GLUCOSE 161* 113*  BUN 9 18  CREATININE 2.41* 3.20*  CALCIUM 8.5 8.8  MG -- --  PHOS 2.8 3.2   CBC:  Basename 09/01/11 0815 08/31/11 1428  WBC 6.2 6.9  NEUTROABS -- --  HGB 8.6* 8.8*  HCT 29.2* 29.5*  MCV 93.6 91.6  PLT 138* 180    Assessment/Plan:  1) Pericardial effusion - clinically no signs of tamponade (Normal BP and HR); repeat echo revealed moderate pericardial effusion with no signs of tamponade. Etiology of effusion unclear. Given h/o immunosuppression for renal transplant, would have ID see patient for recommendations for further eval. Adenopathy recently noted and felt possibly related to lymphoproliferative disease due to immunosuppression. If patient has problems with hypotension with dialysis, can repeat echo early next week. 2) Lower  abdominal pain - GI eval reveals abnormal mucosa from cecum to sigmoid; ischemic injury noted on path. 3) Renal insufficiency-management per nephrology. 4) PVD-continue ASA; would benefit from a statin long term. After acute illness improves, would favor functional study as outpatient given multiple risk factors and evidence of PVD (high risk for CAD). 5) Mildly elevated TSH-management per primary care.  Kirk Ruths 09/02/2011, 7:59 AM

## 2011-09-03 ENCOUNTER — Inpatient Hospital Stay (HOSPITAL_COMMUNITY): Payer: Medicare Other

## 2011-09-03 DIAGNOSIS — N039 Chronic nephritic syndrome with unspecified morphologic changes: Secondary | ICD-10-CM

## 2011-09-03 DIAGNOSIS — K810 Acute cholecystitis: Secondary | ICD-10-CM

## 2011-09-03 DIAGNOSIS — D631 Anemia in chronic kidney disease: Secondary | ICD-10-CM

## 2011-09-03 DIAGNOSIS — I318 Other specified diseases of pericardium: Secondary | ICD-10-CM

## 2011-09-03 DIAGNOSIS — N186 End stage renal disease: Secondary | ICD-10-CM

## 2011-09-03 LAB — GLUCOSE, CAPILLARY
Glucose-Capillary: 191 mg/dL — ABNORMAL HIGH (ref 70–99)
Glucose-Capillary: 210 mg/dL — ABNORMAL HIGH (ref 70–99)
Glucose-Capillary: 152 mg/dL — ABNORMAL HIGH (ref 70–99)

## 2011-09-03 LAB — URINE CULTURE
Colony Count: NO GROWTH
Culture  Setup Time: 201305101300
Culture: NO GROWTH

## 2011-09-03 LAB — CBC
HCT: 30.1 % — ABNORMAL LOW (ref 39.0–52.0)
Hemoglobin: 9 g/dL — ABNORMAL LOW (ref 13.0–17.0)
MCH: 27.9 pg (ref 26.0–34.0)
MCHC: 29.9 g/dL — ABNORMAL LOW (ref 30.0–36.0)
MCV: 93.2 fL (ref 78.0–100.0)
Platelets: 139 10*3/uL — ABNORMAL LOW (ref 150–400)
RBC: 3.23 MIL/uL — ABNORMAL LOW (ref 4.22–5.81)
RDW: 20.5 % — ABNORMAL HIGH (ref 11.5–15.5)
WBC: 7.3 10*3/uL (ref 4.0–10.5)

## 2011-09-03 LAB — BASIC METABOLIC PANEL
BUN: 13 mg/dL (ref 6–23)
CO2: 27 mEq/L (ref 19–32)
Calcium: 8.9 mg/dL (ref 8.4–10.5)
Chloride: 95 mEq/L — ABNORMAL LOW (ref 96–112)
Creatinine, Ser: 3.57 mg/dL — ABNORMAL HIGH (ref 0.50–1.35)
GFR calc Af Amer: 22 mL/min — ABNORMAL LOW (ref >90)
GFR calc non Af Amer: 19 mL/min — ABNORMAL LOW (ref >90)
Glucose, Bld: 217 mg/dL — ABNORMAL HIGH (ref 70–99)
Potassium: 4.7 mEq/L (ref 3.5–5.1)
Sodium: 131 mEq/L — ABNORMAL LOW (ref 135–145)

## 2011-09-03 LAB — HEPATITIS B SURFACE ANTIGEN: Hepatitis B Surface Ag: NEGATIVE

## 2011-09-03 MED ORDER — PARICALCITOL 5 MCG/ML IV SOLN
INTRAVENOUS | Status: AC
  Administered 2011-09-03: 2 ug via INTRAVENOUS
  Filled 2011-09-03: qty 1

## 2011-09-03 MED ORDER — HYDROCODONE-ACETAMINOPHEN 5-325 MG PO TABS
1.0000 | ORAL_TABLET | Freq: Two times a day (BID) | ORAL | Status: DC
  Administered 2011-09-04 – 2011-09-06 (×5): 1 via ORAL
  Filled 2011-09-03 (×5): qty 1

## 2011-09-03 MED ORDER — HYDROMORPHONE HCL PF 1 MG/ML IJ SOLN
INTRAMUSCULAR | Status: AC
  Filled 2011-09-03: qty 1

## 2011-09-03 NOTE — Progress Notes (Signed)
PT Cancellation Note  Treatment cancelled today due to patient's refusal to participate.  Attempted x3 to see pt.  Pt in pain then sleeping then pain again.  RN in and gave meds.  Pt reports "I'm pretty much dependent on these IV pain meds."  Narda Bonds 09/03/2011, 1:01 PM  Narda Bonds, PTA Acute Rehab [phone] (office)

## 2011-09-03 NOTE — Progress Notes (Signed)
@   Subjective:  No chest pain; mild dyspnea when supine.  Rhythm stable NSR   Objective:  Filed Vitals:   09/02/11 1353 09/02/11 2108 09/03/11 0524 09/03/11 0557  BP: 151/69 160/82 148/74 147/70  Pulse: 82 88 83 77  Temp: 98.4 F (36.9 C) 98.3 F (36.8 C) 98.7 F (37.1 C)   TempSrc: Oral Oral Oral   Resp: 18 20 20 18   Height:  5\' 8"  (1.727 m)    Weight:  117 kg (257 lb 15 oz)    SpO2: 100% 98% 97%     Intake/Output from previous day:  Intake/Output Summary (Last 24 hours) at 09/03/11 0737 Last data filed at 09/02/11 2110  Gross per 24 hour  Intake    600 ml  Output    400 ml  Net    200 ml    Physical Exam: Physical exam: Well-developed well-nourished in no acute distress.  Skin is warm and dry.  HEENT is normal.  Neck is supple. JVD difficult to assess Chest is clear to auscultation with normal expansion.  Cardiovascular exam is regular rate and rhythm. No paradoxical pulse. Abdominal exam tender to palpation particularly in low abdomen, abdominal wall edema Extremities s/p left BKA; trace edema and chronic skin changes RLE neuro grossly intact    Lab Results: Basic Metabolic Panel:  Basename 09/01/11 0815 08/31/11 1428  NA 135 132*  K 3.9 3.8  CL 98 95*  CO2 28 29  GLUCOSE 161* 113*  BUN 9 18  CREATININE 2.41* 3.20*  CALCIUM 8.5 8.8  MG -- --  PHOS 2.8 3.2   CBC:  Basename 09/02/11 1135 09/01/11 0815  WBC 7.0 6.2  NEUTROABS 3.7 --  HGB 9.0* 8.6*  HCT 30.6* 29.2*  MCV 93.0 93.6  PLT 173 138*    Assessment/Plan:  1) Pericardial effusion - clinically no signs of tamponade; repeat echo  reveals moderate pericardial effusion with no signs of tamponade and no clinical signs of tamponade. Etiology of effusion unclear. Given h/o immunosuppression for renal transplant, would have ID see patient for recommendations for further eval. Adenopathy recently noted and felt possibly related to lymphoproliferative disease due to immunosuppression. Will repeat  echo 2 weeks after DC.  CT exam yesterday shows stable small-to-moderate pericardial effusion. 2) Lower abdominal pain - GI eval in progress; abnormal mucosa from cecum to sigmoid; ? Mild ischemic injury, bx pending. 3) Renal insufficiency-management per nephrology. 4) PVD-continue ASA; would benefit from a statin long term. After acute illness improves, would favor functional study as outpatient given multiple risk factors and evidence of PVD (high risk for CAD). 5) Mildly elevated TSH-management per primary care.  Dale [name] 09/03/2011, 7:37 AM

## 2011-09-03 NOTE — Progress Notes (Signed)
Patient Demographics  Dale [name], is a 46 y.o. male  [id number]  [id number]  DOB - [birth year]  Admit date - 08/27/2011  Admitting Physician Silvestre Moment, MD  Outpatient Primary MD for the patient is Windy Kalata, MD, MD  LOS - 7     Chief Complaint  Patient presents with  . Shortness of Breath        Subjective:   Dale [name] today has, No headache, No chest pain,  No Nausea, No new weakness tingling or numbness, No Cough - SOB is much better, on 09/02/2011 he is having pain at his left kidney transplant site.  Objective:   Filed Vitals:   09/03/11 0524 09/03/11 0557 09/03/11 0900 09/03/11 1342  BP: 148/74 147/70 142/79 141/71  Pulse: 83 77 82 87  Temp: 98.7 F (37.1 C)  98 F (36.7 C) 98.5 F (36.9 C)  TempSrc: Oral  Oral Oral  Resp: 20 18 19 18   Height:      Weight:      SpO2: 97%  96% 98%    Wt Readings from Last 3 Encounters:  09/02/11 117 kg (257 lb 15 oz)  09/02/11 117 kg (257 lb 15 oz)  09/02/11 117 kg (257 lb 15 oz)     Intake/Output Summary (Last 24 hours) at 09/03/11 1358 Last data filed at 09/03/11 1343  Gross per 24 hour  Intake    480 ml  Output    400 ml  Net     80 ml    Exam Awake Alert, Oriented *3, No new F.N deficits, Normal affect [name],PERRAL Supple Neck,No JVD, No cervical lymphadenopathy appriciated.  Symmetrical Chest wall movement, Good air movement bilaterally, 2 bibasilar Rales RRR,No Gallops,Rubs or new Murmurs, No Parasternal Heave +ve B.Sounds, Abd Soft but maybe slightly distended, No organomegaly appriciated, No rebound -guarding or rigidity, and left renal transplant site is little tender and warm No Cyanosis, Clubbing or edema, No new Rash or bruise, left BKA  Data Review  CBC  Lab 09/02/11 1135 09/01/11 0815 08/31/11 1428 08/31/11 0601 08/30/11 0720  WBC 7.0 6.2 6.9 7.0 8.4  HGB 9.0* 8.6* 8.8* 8.8* 8.6*  HCT 30.6* 29.2* 29.5* 29.7* 28.9*  PLT 173 138* 180 185 153  MCV 93.0 93.6 91.6 92.2  90.6  MCH 27.4 27.6 27.3 27.3 27.0  MCHC 29.4* 29.5* 29.8* 29.6* 29.8*  RDW 20.8* 20.8* 20.6* 20.7* 20.9*  LYMPHSABS 1.9 -- -- -- --  MONOABS 0.8 -- -- -- --  EOSABS 0.6 -- -- -- --  BASOSABS 0.1 -- -- -- --  BANDABS -- -- -- -- --    Chemistries   Lab 09/01/11 0815 08/31/11 1428 08/31/11 0601 08/30/11 0720 08/29/11 0545 08/28/11 0630  NA 135 132* 132* 132* 131* --  K 3.9 3.8 3.9 4.1 5.3* --  CL 98 95* 97 98 96 --  CO2 28 29 28 24 25  --  GLUCOSE 161* 113* 175* 218* 371* --  BUN 9 18 17  35* 31* --  CREATININE 2.41* 3.20* 3.00* 4.15* 3.51* --  CALCIUM 8.5 8.8 8.6 8.5 8.6 --  MG -- -- -- -- -- --  AST -- -- -- -- -- 15  ALT -- -- -- -- -- 8  ALKPHOS -- -- -- -- -- 207*  BILITOT -- -- -- -- -- 0.6   ------------------------------------------------------------------------------------------------------------------ estimated creatinine clearance is 48.1 ml/min (by C-G formula based on Cr of 2.41). ------------------------------------------------------------------------------------------------------------------ No results found for this basename: HGBA1C:2 in the last 72  hours ------------------------------------------------------------------------------------------------------------------ No results found for this basename: CHOL:2,HDL:2,LDLCALC:2,TRIG:2,CHOLHDL:2,LDLDIRECT:2 in the last 72 hours ------------------------------------------------------------------------------------------------------------------ No results found for this basename: TSH,T4TOTAL,FREET3,T3FREE,THYROIDAB in the last 72 hours ------------------------------------------------------------------------------------------------------------------ No results found for this basename: VITAMINB12:2,FOLATE:2,FERRITIN:2,TIBC:2,IRON:2,RETICCTPCT:2 in the last 72 hours  Coagulation profile No results found for this basename: INR:5,PROTIME:5 in the last 168 hours  No results found for this basename: DDIMER:2 in the last 72  hours  Cardiac Enzymes No results found for this basename: CK:3,CKMB:3,TROPONINI:3,MYOGLOBIN:3 in the last 168 hours ------------------------------------------------------------------------------------------------------------------ No components found with this basename: POCBNP:3  Micro Results Recent Results (from the past 240 hour(s))  URINE CULTURE     Status: Normal   Collection Time   09/02/11 11:39 AM      Component Value Range Status Comment   Specimen Description URINE, CLEAN CATCH   Final    Special Requests NONE   Final    Culture  Setup Time [id number]   Final    Colony Count NO GROWTH   Final    Culture NO GROWTH   Final    Report Status 09/03/2011 FINAL   Final     Radiology Reports Ct Abdomen Pelvis Wo Contrast  08/27/2011  *RADIOLOGY REPORT*  Clinical Data: Worsening bilateral lower quadrant abdominal pain. Surgical history includes recent left superficial inguinal lymph node resection, appendectomy, renal transplant, and hernia repair.  CT ABDOMEN AND PELVIS WITHOUT CONTRAST 08/27/2011:  Technique:  Multidetector CT imaging of the abdomen and pelvis was performed following the standard protocol without intravenous contrast.  Comparison: Unenhanced CT abdomen and pelvis 08/22/2011, 08/04/2011.  Findings: Interval slight increase in size of the fluid collection in the left superficial inguinal region (overlying the common femoral artery and vein, not within the left inguinal canal), current measurements approximating 3.9 x 3.9 cm (series 2, image 97), previously 4.1 x 3.3 cm.  The fluid collection has a Hounsfield measurement of 6, indicating simple fluid.  On the prior examination, gas bubbles were present within the collection, and this is in the area of the recent lymph node resection.  Interval development of thickening of the wall of the gallbladder, with edema/inflammation surrounding the gallbladder.  No biliary ductal dilation.  Normal low dose unenhanced appearance of  the liver, spleen, and adrenal glands.  Stable mild pancreatic atrophy. Atrophic native kidneys consistent with end-stage renal disease, without focal parenchymal abnormality.  Moderate to severe aorto- iliofemoral and visceral artery atherosclerosis without aneurysm.  Lymphadenopathy in the retroperitoneum of the abdomen and both sides of the pelvis, unchanged.  Bilateral superficial inguinal lymphadenopathy, unchanged.  No new or enlarging lymphadenopathy. Index left retroperitoneal node approximately 3-4 cm above the aortic bifurcation measures approximately 2.3 x 1.7 cm (series 2, image 46).  Index right retrocrural lymph node is stable at 1.1 cm (series 2, image 19).  Stomach and small bowel normal in appearance.  Distal descending and sigmoid colon diverticulosis without evidence of acute diverticulitis.  Remainder of the colon unremarkable.  Apparent wall thickening in the cecum is felt to be related to decompression.  Lipoma again noted involving the ileocecal valve. No ascites.  Satisfactory appearance of the renal transplant in the left side of the pelvis, without evidence of hydronephrosis or focal parenchymal abnormality.  Urinary bladder unremarkable.  Prostate gland and seminal vesicles normal.  No evidence of recurrent right inguinal hernia.  Diffuse body wall edema.  Bone window images demonstrate degenerative changes involving the lower thoracic and upper lumbar spine.  Interval increase in size of the now large pericardial effusion. Interval  increase in size of a small bilateral pleural effusions. Associated mild passive atelectasis in the lower lobes.  IMPRESSION:  1.  Interval slight increase in size of the postoperative seroma or liquefied hematoma in the left superficial inguinal region at the site of prior lymph node resection. 2.  Stable retroperitoneal and bilateral superficial inguinal lymphadenopathy. 3.  CT findings consistent with acute cholecystitis, new. 4.  Interval increase in size  of a now large pericardial effusion. 5.  Interval increase in size of small bilateral pleural effusions. Associated passive atelectasis in the lower lobes, left greater than right. 6.  Distal descending and sigmoid colon diverticulosis without evidence of acute diverticulitis. 7.  Satisfactory unenhanced appearance of the renal transplant in the left side of the pelvis.  These results were called by telephone on 08/27/2011  at  1210 hours to  Dr. Maryan Rued of the emergency department, who verbally acknowledged these results.  Original Report Authenticated By: Deniece Portela, M.D.   Ct Abdomen Pelvis Wo Contrast  08/22/2011  *RADIOLOGY REPORT*  Clinical Data: Abdominal distention, constipation  CT ABDOMEN AND PELVIS WITHOUT CONTRAST  Technique:  Multidetector CT imaging of the abdomen and pelvis was performed following the standard protocol without intravenous contrast.  Comparison: 08/06/2011  Findings: There is small pericardial effusion measures 7 mm maximum thickness.  Atherosclerotic calcifications of the coronary arteries. Small left pleural effusion with left base posterior atelectasis.  NG tube with tip coiled within stomach.  Sagittal images of the spine shows degenerative changes lower thoracic spine.  The study is limited without IV contrast.  The unenhanced liver is unremarkable. Unenhanced pancreas, spleen and adrenals are unremarkable. Again noted renal atrophy and chronic renal cortical thinning. Vascular calcifications bilateral kidney are stable.  Again noted multiple enlarged retroperitoneal and mesenteric lymph nodes.  A left periaortic lymph node in axial image 37 measures 2 x 1.6 cm.  Moderate stool noted in the right colon.  Small amount of layering sludge  or  tiny gallstones are suspected within dependent gallbladder.  Atherosclerotic calcifications are again noted SMA, splenic artery bilateral renal artery origin.  Again noted a transplanted kidney in the left iliac fossa stable in  appearance without evidence of hydronephrosis or hydroureter.  Mild distended urinary bladder.  Oral contrast material was given to the patient.  No small bowel or colonic obstruction.  Enlarged lymph nodes bilateral inguinal region are again noted.  The largest right inguinal lymph node measures 1.7 x 1.9 cm.  The largest left inguinal lymph node measures 1.7 x 1.5 cm.  There is a fluid collection in the left inguinal region measures 12 HU in attenuation.  This measures 4 x 3.3 cm.  May represent a resolving seroma or hematoma.  There is thickening of the skin anterior abdominal wall and mild subcutaneous anasarca infiltration of the subcutaneous fat.  A small midline lower ventral hernia containing fat measures 3.5 cm only partially visualized.  Postsurgical changes are noted right inguinal region.  No destructive bony lesions are noted within pelvis.  IMPRESSION:  1.  There is small pericardial effusion.  Small left pleural effusion with left base posteriorly atelectasis. 2.  Again noted atrophic native kidneys.  Stable transplanted kidney in the left iliac fossa.  No hydronephrosis or hydroureter. 3.  Again noted retroperitoneal and mesenteric enlarged lymph nodes.  Bilateral enlarged inguinal lymph nodes again noted. 4.  There is a small fluid collection in the left inguinal region measures 4 x 3.3 cm.  This may represent a  seroma or resolving hematoma.  Less likely inguinal abscess. 5.  Moderate stool noted in the right colon.  No small bowel or colonic obstruction.  Original Report Authenticated By: Lahoma Crocker, M.D.   Ct Abdomen Pelvis Wo Contrast  08/04/2011  *RADIOLOGY REPORT*  Clinical Data: Recurrent renal failure requiring hemodialysis. History of renal transplant.  CT ABDOMEN AND PELVIS WITHOUT CONTRAST  Technique:  Multidetector CT imaging of the abdomen and pelvis was performed following the standard protocol without intravenous contrast.  Comparison: Right upper quadrant abdominal ultrasound  07/14/2011. Abdominal CT 08/26/2004.  Findings: Images through the lung bases demonstrate cardiomegaly and hemodialysis catheters within the right atrium.  There is vascular congestion and mild bibasilar atelectasis.  No focal abnormalities are seen within the liver, spleen, gallbladder, pancreas or adrenal glands.  The spleen is slightly larger than on the prior CT, but without focal abnormality.  There is severe chronic cortical thinning of the native kidneys bilaterally.  Appearance is unchanged.  The transplanted kidney in the left iliac fossa appears stable without hydronephrosis, significant cortical thinning or surrounding inflammatory change.  The patient has developed multiple enlarged retroperitoneal and mesenteric lymph nodes.  Examples include an 11 mm right retrocrural node on image 16, a mesenteric node measuring 3.6 x 2.3 cm on image 31, a left periaortic node measuring 2.3 x 3.1 cm on image 46 and a 2.0 x 3.8 cm left external iliac node on image 79. In addition, there are several mildly enlarged lymph nodes within the left groin.  There are postsurgical changes in the right groin without significant adenopathy.  No bowel lesions are identified.  There is no evidence of bowel obstruction.  The bladder contents demonstrate mildly increased density.  There is no evidence of diskitis or other acute osseous abnormality.  IMPRESSION:  1.  Interval development of multi focal lymphadenopathy suspicious for lymphoma.  Enlarged lymph nodes in the left groin are most accessible for excisional biopsy. 2.  Slight interval increase in splenic size without significant splenomegaly or focal abnormality. 3.  Transplanted kidney in the left iliac fossa has a stable appearance without hydronephrosis or perinephric soft tissue stranding.  Chronic atrophy of the native kidneys appears unchanged. 4.  High density urine within the urinary bladder.  Original Report Authenticated By: Vivia Ewing, M.D.   Dg Chest 2  View  08/02/2011  *RADIOLOGY REPORT*  Clinical Data: History of difficulty breathing.  History of fever.  CHEST - 2 VIEW  Comparison: 07/22/2011.  08/01/2011.  Findings: Right sided dialysis catheter has tips in right atrial region.  There is stable cardiac silhouette enlargement.  There is upper lobe vascular prominence with pulmonary venous hypertension vascular congestion pattern.  No consolidation or definite pleural effusion is seen.  There is slight elevation of the margin of the left hemidiaphragm with minimal basilar atelectasis. On the lateral image there appears to be some posterior atelectasis and patchy infiltrative density without consolidation.  There is minimal degenerative spondylosis.  IMPRESSION: Dialysis catheter in place.  Stable cardiac silhouette enlargement. Pulmonary venous hypertension vascular congestion pattern.  No consolidation or pleural effusion. Left basilar and posterior atelectasis and patchy infiltrative densities without consolidation.  Original Report Authenticated By: Delane Ginger, M.D.   Nm Hepatobiliary Liver Func  08/28/2011  *RADIOLOGY REPORT*  Clinical data: Abdominal pain.  Gallbladder wall thickening on ultrasound.  HEPATOBILIARY SCINTIGRAPHY WITH EJECTION FRACTION  Anterior imaging after5.70mCi Tc99M Choletec IV. There is prompt clearance of the radiopharmaceutical from the blood pool. Timely visualization of  activity in central bile ducts, small bowel, and gallbladder. After 1 hour,2.3 mcg CCK was infused intravenously and imaging continued. The patient describes no pain with infusion. The calculated gallbladder ejection fraction over 30 minutes is 86% (normal >30% at  thirty minutes [facility] et al., 1992 J. Nuclear Med).  IMPRESSION 1. Patency of cystic and common bile ducts. 2. Normal gallbladder ejection fraction.  Original Report Authenticated By: Trecia Rogers, M.D.   US Abdomen Complete  08/27/2011  *RADIOLOGY REPORT*  Clinical Data:  Right upper quadrant  pain, nausea; history of kidney transplant  ABDOMINAL ULTRASOUND COMPLETE  Comparison:  None.  Findings:  Gallbladder:   No gallstones are identified at but there is gallbladder wall thickening and pericholecystic fluid.  Common Bile Duct:  Within normal limits in caliber. Measures 3 mm.  Liver: No focal mass lesion identified.  Within normal limits in parenchymal echogenicity.  IVC:  Appears normal.  Pancreas:  No abnormality identified.  Spleen:  Within normal limits in size and echotexture.  Right kidney:  Atrophic. No evidence of mass or hydronephrosis.  Left kidney: Atrophic.  No evidence of mass or hydronephrosis.  The left lower quadrant transplanted kidney is not imaged.  Abdominal Aorta:  No aneurysm identified.  IMPRESSION: Acalculous cholecystitis with gallbladder wall thickening and pericholecystic fluid.  Original Report Authenticated By: Duayne Cal, M.D.   US Biopsy  08/05/2011  *RADIOLOGY REPORT*  Clinical history:46 year old with history of renal transplant and fevers.  The patient has lymphadenopathy in the retroperitoneum and left inguinal region.  PROCEDURE(S): ULTRASOUND GUIDED BIOPSY OF LEFT INGUINAL LYMPH NODE.  Physician: Stephan Minister. Henn, MD  Medications:None  Moderate sedation time:None  Fluoroscopy time: None  Procedure:The procedure was explained to the patient.  The risks and benefits of the procedure were discussed and the patient's questions were addressed.  Informed consent was obtained from the patient.  The left inguinal area was evaluated with ultrasound. Enlarged lymph nodes were identified.  Prominent lymph node anterior to the saphenofemoral junction of the left common femoral vein was targeted.  The left groin was prepped and draped in a sterile fashion.  The skin was anesthetized with 1% lidocaine.  18 gauge core needle was directed into the lymph node with ultrasound guidance.  Five core biopsies were obtained.  Samples placed in saline.  Specimens sent for pathology and  microbiology.  Findings:Multiple enlarged left inguinal lymph nodes.  Prominent lymph node anterior to the saphenofemoral junction was biopsied. No significant bleeding following the core biopsies.  Complications: None  Impression:Ultrasound guided core biopsies of a large left inguinal lymph node.  Samples sent for pathology and microbiology.  Original Report Authenticated By: Markus Daft, M.D.   Dg Chest Port 1 View  08/31/2011  *RADIOLOGY REPORT*  Clinical Data: Shortness of breath and chest pressure.  Dialysis.  PORTABLE CHEST - 1 VIEW  Comparison: 08/23/2011  Findings: Marked cardiac enlargement.  Mild vascular congestion without overt failure or focal infiltrates.  New left base process, likely pleural effusion, with left lower lobe retrocardiac subsegmental atelectasis, not appreciated on priors.  Dialysis catheter good position and unchanged from priors.  No pneumothorax.  IMPRESSION: Marked cardiac enlargement with slight vascular congestion. New left pleural effusion with left base   subsegmental atelectasis.  Original Report Authenticated By: Staci Righter, M.D.   Dg Chest Portable 1 View  08/23/2011  *RADIOLOGY REPORT*  Clinical Data: Shortness of breath  PORTABLE CHEST - 1 VIEW  Comparison: 08/03/2011  Findings: Cardiomegaly.  Central vascular congestion. Mild opacity at the left lung base.  Otherwise, no focal consolidation.  No overt edema.  No pleural effusion or pneumothorax.  Large bore right subclavian catheter tip projects over the cavoatrial junction.  No acute osseous abnormality identified.  IMPRESSION: Cardiomegaly.  Vascular congestion without overt edema.  Mild left lung base opacity; atelectasis versus infiltrate.  Original Report Authenticated By: Suanne Marker, M.D.   Dg Chest Port 1 View  08/03/2011  *RADIOLOGY REPORT*  Clinical Data: Cough and shortness of breath.  PORTABLE CHEST - 1 VIEW  Comparison: 08/12/2011.  Findings: The diatek catheter is stable.  The heart is  enlarged but stable.  The lungs are better aerated with resolving edema.  No effusions.  IMPRESSION: Slight improved lung aeration.  Vascular congestion without pulmonary edema.  Original Report Authenticated By: P. Kalman Jewels, M.D.   Dg Chest Port 1 View  08/01/2011  *RADIOLOGY REPORT*  Clinical Data: Central line placement.  PORTABLE CHEST - 1 VIEW  Comparison: 07/22/2011.  Findings: 1504 hours.  Right IJ dialysis catheter tips are present within the right atrium.  There is stable cardiomegaly with chronic vascular congestion.  No confluent airspace opacity, significant pleural effusion or pneumothorax is seen.  IMPRESSION: Dialysis catheter placement without demonstrated pneumothorax. Stable cardiomegaly and vascular congestion.  Original Report Authenticated By: Vivia Ewing, M.D.   Dg Abd Acute W/chest  08/27/2011  *RADIOLOGY REPORT*  Clinical Data: Abdominal pain and shortness of breath.  ACUTE ABDOMEN SERIES (ABDOMEN 2 VIEW & CHEST 1 VIEW)  Comparison: Chest x-ray fourth 32,013.  Findings: The upright chest x-ray demonstrates stable cardiac enlargement.  The right IJ diatek catheter is stable.  There is vascular congestion and mild pulmonary edema with small pleural effusions.  Two views of the abdomen demonstrate an unremarkable bowel gas pattern.  There is scattered air and stool in the colon.  No distended small bowel loops to suggest obstruction.  No free air. The bony structures are intact.  IMPRESSION:  1.  Cardiac enlargement with vascular congestion, mild interstitial edema and small effusions. 2.  No plain film findings for an acute abdominal process.  Original Report Authenticated By: P. Kalman Jewels, M.D.   Dg Abd Acute W/chest  08/22/2011  *RADIOLOGY REPORT*  Clinical Data: Abdominal pain and constipation.  ACUTE ABDOMEN SERIES (ABDOMEN 2 VIEW & CHEST 1 VIEW)  Comparison: Chest x-ray 08/03/2011.  Abdominal x-ray 04/06/2008.  Findings: There is a right internal jugular PermCath  with the tips in the right atrium.  Lung volumes are normal.  Minimal dependent bibasilar opacities are favored to reflect areas of subsegmental atelectasis.  No definite pleural effusions.  Mild pulmonary venous congestion without frank pulmonary edema.  Heart size is mildly enlarged (unchanged).  Mediastinal contours are unremarkable. Atherosclerotic calcifications within the arch of the aorta.  No pneumoperitoneum  Supine and upright views of the abdomen demonstrate gas and stool scattered throughout the colon extending to the level of the distal rectum.  There are some borderline and mildly dilated loops of gas- filled small bowel (up to 4.2 cm in diameter) throughout the central and upper abdomen.  No definite air-fluid levels are noted on the upright projection.  IMPRESSION: 1.  Nonspecific bowel gas pattern, as above.  Findings may simply reflect an ileus, however, clinical correlation for signs and symptoms of early or partial small bowel obstruction is recommended. 2.  No pneumoperitoneum. 3.  No radiographic evidence of acute cardiopulmonary disease. 4.  Mild cardiomegaly is unchanged.  5.  Support apparatus, as above.  Original Report Authenticated By: Etheleen Mayhew, M.D.    Scheduled Meds:    . aspirin  81 mg Oral Daily  . calcium acetate  667 mg Oral TID WC  . cycloSPORINE modified  75 mg Oral BID  . darbepoetin (ARANESP) injection - DIALYSIS  100 mcg Intravenous Q Thu-HD  . feeding supplement  1 Container Oral TID BM  . ferric gluconate (FERRLECIT/NULECIT) IV  125 mg Intravenous Weekly  . insulin aspart  0-5 Units Subcutaneous QHS  . insulin aspart  0-9 Units Subcutaneous TID WC  . insulin glargine  25 Units Subcutaneous Daily  . iohexol  20 mL Oral Q1 Hr x 2  . labetalol  100 mg Oral QHS  . levothyroxine  75 mcg Oral QAC breakfast  . multivitamin  1 tablet Oral Daily  . paricalcitol  2 mcg Intravenous Q T,Th,Sa-HD  . piperacillin-tazobactam (ZOSYN)  IV  2.25 g Intravenous Q8H    . polyethylene glycol  17 g Oral BID  . predniSONE  5 mg Oral Q breakfast  . DISCONTD: piperacillin-tazobactam (ZOSYN)  IV  2.25 g Intravenous Q8H   Continuous Infusions:  PRN Meds:.sodium chloride, sodium chloride, albumin human, albuterol, bisacodyl, diphenhydrAMINE, heparin, HYDROmorphone (DILAUDID) injection, iohexol, lidocaine, lidocaine-prilocaine, nitroGLYCERIN, ondansetron (ZOFRAN) IV, ondansetron, pentafluoroprop-tetrafluoroeth  Assessment & Plan   1. Abdominal pain  CT scan showed findings suggestive of possible acute cholecystitis , however HIDA scan was negative, most likely pain was secondary to ischemic colitis from hyperperfusion caused by low blood pressures during dialysis, GI following and colonic biopsies are consistent with the same. Currently feels better from abdominal pain standpoint , continue bowel regimen as recommended by GI . For now GI and surgery has signed off.  Patient has repeat left-sided abdominal pain at the site of his transplant kidney on 09/02/2011, UA is questionable for UTI cultures are pending, patient has been kept on empiric Zosyn, his left flank transplant kidney site tenderness is slightly improved, however no evidence of transplant kidney infection or acute change was found on abdominal CT done yesterday, we'll continue to monitor CBC was also normal.    2. ESRD (end stage renal disease) - appears to have failure of transplanted kidneys, Renal serviceis following. Cont dialysis TTS.  Cont prednisone 5 mg and cyclosporin 75 mg (dose recently decreased). Outpatient tapering to be continued.    3.Diabetes mellitus - Patient was on insulin pump as outpatient which is currently turned off, started Lantus 25 units daily was added SSI monitor CBGs and adjust PRN . Diabetic coordinator is following.  CBG (last 3)   Basename 09/03/11 1146 09/03/11 0752 09/02/11 2105  GLUCAP 210* 191* 136*     Lab Results  Component Value Date   HGBA1C 6.8*  08/23/2011      4. Hypertension: Blood pressure better, continue to monitor blood pressures on the decreased Labetalol dose and will also add parameters to hold for SBP<110,HR,60.     5.Obstructive sleep apnea -  Continue CPAP, as tolerated by the patient.    6.ACUTE DIASTOLIC CHF (congestive heart failure)/pericardial effusion -worsened by reduced fluid removal during dialysis treatment which was due to his low blood pressures, pressures have been better patient back on dialysis schedule, had extra dialysis on 08/31/2011 for excess fluid removal, now shortness of breath is completely resolved, cardiology following, pericardial effusion of no hemodynamic significance per cardiology.     7.Hypothyroidism: -Continue Synthroid, elevated TSH but normal free T4 level will  keep on current dose of synthroid . Outpatient TSH monitoring.    8.Anemia -anemia of chronic disease - Aranesp as per renal ,  H&H is stable.    9.Right lower extremity ulcer/chronic erythema & S/P BKA (below knee amputation)   Wound care follow up.       CODE STATUS: Full code for now however stated that he he does not wish to be on prolonged life support   DVT Prophylaxis SCDs - patient remains noncompliant was counseled about the risks of DVT, PE and death.    Updated wife bedside on 08/31/2011.  [name] M.D on 09/03/2011 at 1:58 PM  [facility] Office  [phone]

## 2011-09-03 NOTE — Procedures (Signed)
Patient seen on Hemodialysis. QB400, UF goal4.6L Treatment adjusted as needed.  Elmarie Shiley MD [facility]. Office # [phone] Pager # [phone] 3:32 PM

## 2011-09-03 NOTE — Progress Notes (Signed)
I have seen and examined this patient and agree with the assessment/plan as outlined above by [facility]. Minimize hypotension at HD and await decision on need for hemicolectomy if unable to medically manage symptoms. Loredana Medellin K.,MD 09/03/2011 10:06 AM

## 2011-09-03 NOTE — Progress Notes (Signed)
Subjective:  Some lower abdominal pain continues at transplanted  Kidney site,improved with pain meds, no cest pain sob only when supine Objective Vital signs in last 24 hours: Filed Vitals:   09/02/11 1353 09/02/11 2108 09/03/11 0524 09/03/11 0557  BP: 151/69 160/82 148/74 147/70  Pulse: 82 88 83 77  Temp: 98.4 F (36.9 C) 98.3 F (36.8 C) 98.7 F (37.1 C)   TempSrc: Oral Oral Oral   Resp: 18 20 20 18   Height:  5\' 8"  (1.727 m)    Weight:  117 kg (257 lb 15 oz)    SpO2: 100% 98% 97%    Weight change: 2.5 kg (5 lb 8.2 oz)  Intake/Output Summary (Last 24 hours) at 09/03/11 0920 Last data filed at 09/02/11 2110  Gross per 24 hour  Intake    600 ml  Output    400 ml  Net    200 ml   Labs: Basic Metabolic Panel:  Lab [id number] 08/31/11 1428 08/31/11 0601  NA 135 132* 132*  K 3.9 3.8 3.9  CL 98 95* 97  CO2 28 29 28   GLUCOSE 161* 113* 175*  BUN 9 18 17   CREATININE 2.41* 3.20* 3.00*  CALCIUM 8.5 8.8 8.6  ALB -- -- --  PHOS 2.8 3.2 3.6   Liver Function Tests:  Lab 09/01/11 0815 08/31/11 1428 08/31/11 0601 08/28/11 0630  AST -- -- -- 15  ALT -- -- -- 8  ALKPHOS -- -- -- 207*  BILITOT -- -- -- 0.6  PROT -- -- -- 7.3  ALBUMIN 2.0* 2.0* 1.9* --   No results found for this basename: LIPASE:3,AMYLASE:3 in the last 168 hours No results found for this basename: AMMONIA:3 in the last 168 hours CBC:  Lab 09/02/11 1135 09/01/11 0815 08/31/11 1428 08/31/11 0601 08/30/11 0720  WBC 7.0 6.2 6.9 -- --  NEUTROABS 3.7 -- -- -- --  HGB 9.0* 8.6* 8.8* -- --  HCT 30.6* 29.2* 29.5* -- --  MCV 93.0 93.6 91.6 92.2 90.6  PLT 173 138* 180 -- --   Cardiac Enzymes: No results found for this basename: CKTOTAL:5,CKMB:5,CKMBINDEX:5,TROPONINI:5 in the last 168 hours CBG:  Lab 09/03/11 0752 09/02/11 2105 09/02/11 1717 09/02/11 1143 09/02/11 0749  GLUCAP 191* 136* 60* 164* 107*    Iron Studies: No results found for this basename: IRON,TIBC,TRANSFERRIN,FERRITIN in the last 72  hours Studies/Results: Ct Abdomen Pelvis W Contrast  09/02/2011  *RADIOLOGY REPORT*  Clinical Data: Left-sided abdominal pain.  History of renal transplant.  CT ABDOMEN AND PELVIS WITH CONTRAST  Technique:  Multidetector CT imaging of the abdomen and pelvis was performed following the standard protocol during bolus administration of intravenous contrast.  Contrast: 167mL OMNIPAQUE IOHEXOL 300 MG/ML  SOLN  Comparison: CT scan 08/27/2011.  Findings: Examination is limited by a body habitus.  The patient is touching the gantry which is creating significant artifact.  The lung bases demonstrates small bilateral pleural effusions and bibasilar atelectasis.  A stable small to moderate-sized pericardial effusion is noted.  The liver is unremarkable and stable.  No focal lesions or biliary dilatation.  The gallbladder is mildly contracted.  I do not see any inflammation was present on the prior CT scan.  The pancreas is unremarkable and stable.  Mild pancreatic atrophy for age.  The spleen is normal in size.  No focal lesions.  The adrenal glands are normal and stable.  The kidneys are very small and demonstrate significant atrophy.  Renal artery calcifications are noted.  The  stomach, duodenum, small bowel and colon are grossly normal. No inflammatory changes or mass lesions.  No obstruction.  Stable enlarged mesenteric and retroperitoneal lymph nodes.  The aorta is normal in caliber.  Stable atherosclerotic changes.  The transplanted kidney in the left pelvis appears normal.  Normal perfusion.  No obstruction.  The bladder is normal.  The seminal vesicles and prostate gland are unremarkable.  Vas deferens calcifications are noted.  No pelvic mass, adenopathy or free pelvic fluid collections.  Stable borderline enlarged pelvic lymph nodes.  Stable enlarged inguinal lymph nodes.  There is a persistent left inguinal/groin fluid collection which is likely a liquefied hematoma.  IMPRESSION:  1.  Persistent small bilateral  pleural effusions, left greater than right with overlying atelectasis. 2.  Persistent small to moderate-sized pericardial effusion. 3.  Stable mesenteric, retroperitoneal, pelvic and inguinal adenopathy. 4.  No acute intra abdominal findings. 5.  Persistent fluid collection in the left inguinal/groin area.  Original Report Authenticated By: P. Kalman Jewels, M.D.   Medications:      . aspirin  81 mg Oral Daily  . calcium acetate  667 mg Oral TID WC  . cycloSPORINE modified  75 mg Oral BID  . darbepoetin (ARANESP) injection - DIALYSIS  100 mcg Intravenous Q Thu-HD  . feeding supplement  1 Container Oral TID BM  . ferric gluconate (FERRLECIT/NULECIT) IV  125 mg Intravenous Weekly  . insulin aspart  0-5 Units Subcutaneous QHS  . insulin aspart  0-9 Units Subcutaneous TID WC  . insulin glargine  25 Units Subcutaneous Daily  . iohexol  20 mL Oral Q1 Hr x 2  . labetalol  100 mg Oral QHS  . levothyroxine  75 mcg Oral QAC breakfast  . multivitamin  1 tablet Oral Daily  . paricalcitol  2 mcg Intravenous Q T,Th,Sa-HD  . piperacillin-tazobactam (ZOSYN)  IV  2.25 g Intravenous Q8H  . polyethylene glycol  17 g Oral BID  . predniSONE  5 mg Oral Q breakfast  . DISCONTD: calcium acetate  1,334 mg Oral TID WC  . DISCONTD: piperacillin-tazobactam (ZOSYN)  IV  2.25 g Intravenous Q8H   I  have reviewed scheduled and prn medications.  Physical Exam: General: Alert,nad Heart: RRR/with distant sounds no rub heard  Lungs: Sl Decreased sounds,bases, but clear  Abdomen: BS+ and nl. Protuberant,sl .less tender left lower quad. Extremities: Dialysis Access: + B/T R UA AVF, 1+ bipedal edema, , chronic skin changes with ant .lower leg dressing on skin tear.      Problem/Plan: 1. Abdominal pain/Constipation/Ischemic Colitis- colonoscopy with tics and questionable area of ischemic change possible; surgery following (right colon biopsies are consistent with ischemia) HIDA SCAN NEG. Yesterday CT abd.=No acute  intra abdominal findings. . Persistent fluid collection in the left inguinal/groin area/ On Zosyn .Urine culture pending . 2.Pericardial effusion = no evidence of tamponade; cardiology following; 3.ESRD/s/p failed transplant - (TTS SWAF);on cyclosporine 75 bid and prednisone 5; Required extra dialysis past 2 days due to volume overload and CHF;. Attempt 3 to 4 liters in am sat Hd. Use albumin for bp support/uf keep sbp 110 or higher.  4. Anemia - Aranesp 176mcg qwk and weekly IV Fe; follow trend 8.6 hgb yesterday  5. Secondary hyperparathyroidism - on phoslo; diet intake poor; cont Zemplar @ 2 mcg q HD;follow ca/phos closely  phos 2.8 Phoslo 2 decr. To 1  6. HTN/volume - hypotensive during HD; recent decreased dose labetalol; pain meds factor; incidental pericardial effusion on Echo; use albumin 25%  prn during HD and cont attempt max UF  7. Nutrition - albumin 1.9; diet advanced; has gastroparesis and some nausea; ^ high protein diet and encourage more intake w/ resource (^ protein CL supplement)  8. DM/gastroparesis- (has insulin pump at home); CBG and SSI while inpt; HgbA1c 6.8%; not getting his reglan; GI signed off yesterday.  9. Venous/Vascular insufficiency- wound care following  10. Hypothyroidism- on synthroid; TSH 5.48 on adm; defer primary  12. OSA/Obesity- wears CPAP at home but not here, dw pt. And wife to bring his CPAP from home for use here .     Ernest Haber, PA-C [facility] [phone] 09/03/2011,9:20 AM  LOS: 7 days

## 2011-09-03 NOTE — Progress Notes (Signed)
Rt talked to patient about wearing CPAP.  Patient stated that he wanted to wait till he went home to start CPAP.  Further the patient stated that he had not brought his CPAP from home.  RT offered to setup a CPAP for the patient.  The patient refused and stated that he could wear his CPAP with his nasal pillow mask.  Regino Schultze, RRT, RCP

## 2011-09-04 ENCOUNTER — Inpatient Hospital Stay (HOSPITAL_COMMUNITY): Payer: Medicare Other

## 2011-09-04 DIAGNOSIS — K810 Acute cholecystitis: Secondary | ICD-10-CM

## 2011-09-04 DIAGNOSIS — I318 Other specified diseases of pericardium: Secondary | ICD-10-CM

## 2011-09-04 DIAGNOSIS — D631 Anemia in chronic kidney disease: Secondary | ICD-10-CM

## 2011-09-04 DIAGNOSIS — N186 End stage renal disease: Secondary | ICD-10-CM

## 2011-09-04 DIAGNOSIS — N039 Chronic nephritic syndrome with unspecified morphologic changes: Secondary | ICD-10-CM

## 2011-09-04 LAB — GLUCOSE, CAPILLARY
Glucose-Capillary: 102 mg/dL — ABNORMAL HIGH (ref 70–99)
Glucose-Capillary: 134 mg/dL — ABNORMAL HIGH (ref 70–99)
Glucose-Capillary: 170 mg/dL — ABNORMAL HIGH (ref 70–99)
Glucose-Capillary: 72 mg/dL (ref 70–99)
Glucose-Capillary: 94 mg/dL (ref 70–99)

## 2011-09-04 NOTE — Progress Notes (Signed)
Patient Demographics  Dale [name], is a 46 y.o. male  [id number]  [id number]  DOB - Dec 10, 1965  Admit date - 08/27/2011  Admitting Physician Silvestre Moment, MD  Outpatient Primary MD for the patient is Windy Kalata, MD, MD  LOS - 8     Chief Complaint  Patient presents with  . Shortness of Breath        Subjective:   Dale [name] today has, No headache, No chest pain,  No Nausea, No new weakness tingling or numbness, No Cough - SOB is much better, on 09/02/2011 he is having pain at his left kidney transplant site.  Objective:   Filed Vitals:   09/03/11 1942 09/03/11 2145 09/04/11 0622 09/04/11 1008  BP: 139/76 150/60 125/55 144/66  Pulse: 89 89 82 87  Temp: 99.5 F (37.5 C) 98.9 F (37.2 C) 98.2 F (36.8 C) 97.5 F (36.4 C)  TempSrc: Oral Oral Oral Oral  Resp: 18 19 18 19   Height:      Weight: 114.2 kg (251 lb 12.3 oz)     SpO2: 98% 100% 96% 98%    Wt Readings from Last 3 Encounters:  09/03/11 114.2 kg (251 lb 12.3 oz)  09/03/11 114.2 kg (251 lb 12.3 oz)  09/03/11 114.2 kg (251 lb 12.3 oz)     Intake/Output Summary (Last 24 hours) at 09/04/11 1144 Last data filed at 09/04/11 1009  Gross per 24 hour  Intake   2641 ml  Output   4050 ml  Net  -1409 ml    Exam Awake Alert, Oriented *3, No new F.N deficits, Normal affect [place].AT,PERRAL Supple Neck,No JVD, No cervical lymphadenopathy appriciated.  Symmetrical Chest wall movement, Good air movement bilaterally, 2 bibasilar Rales RRR,No Gallops,Rubs or new Murmurs, No Parasternal Heave +ve B.Sounds, Abd Soft but maybe slightly distended, No organomegaly appriciated, No rebound -guarding or rigidity, and left renal transplant site is little tender and warm No Cyanosis, Clubbing or edema, No new Rash or bruise, left BKA  Data Review  CBC  Lab 09/03/11 1700 09/02/11 1135 09/01/11 0815 08/31/11 1428 08/31/11 0601  WBC 7.3 7.0 6.2 6.9 7.0  HGB 9.0* 9.0* 8.6* 8.8* 8.8*  HCT 30.1* 30.6* 29.2*  29.5* 29.7*  PLT 139* 173 138* 180 185  MCV 93.2 93.0 93.6 91.6 92.2  MCH 27.9 27.4 27.6 27.3 27.3  MCHC 29.9* 29.4* 29.5* 29.8* 29.6*  RDW 20.5* 20.8* 20.8* 20.6* 20.7*  LYMPHSABS -- 1.9 -- -- --  MONOABS -- 0.8 -- -- --  EOSABS -- 0.6 -- -- --  BASOSABS -- 0.1 -- -- --  BANDABS -- -- -- -- --    Chemistries   Lab 09/03/11 1700 09/01/11 0815 08/31/11 1428 08/31/11 0601 08/30/11 0720  NA 131* 135 132* 132* 132*  K 4.7 3.9 3.8 3.9 4.1  CL 95* 98 95* 97 98  CO2 27 28 29 28 24   GLUCOSE 217* 161* 113* 175* 218*  BUN 13 9 18 17  35*  CREATININE 3.57* 2.41* 3.20* 3.00* 4.15*  CALCIUM 8.9 8.5 8.8 8.6 8.5  MG -- -- -- -- --  AST -- -- -- -- --  ALT -- -- -- -- --  ALKPHOS -- -- -- -- --  BILITOT -- -- -- -- --   ------------------------------------------------------------------------------------------------------------------ estimated creatinine clearance is 32 ml/min (by C-G formula based on Cr of 3.57). ------------------------------------------------------------------------------------------------------------------ No results found for this basename: HGBA1C:2 in the last 72 hours ------------------------------------------------------------------------------------------------------------------ No results found for this basename: CHOL:2,HDL:2,LDLCALC:2,TRIG:2,CHOLHDL:2,LDLDIRECT:2 in the last  72 hours ------------------------------------------------------------------------------------------------------------------ No results found for this basename: TSH,T4TOTAL,FREET3,T3FREE,THYROIDAB in the last 72 hours ------------------------------------------------------------------------------------------------------------------ No results found for this basename: VITAMINB12:2,FOLATE:2,FERRITIN:2,TIBC:2,IRON:2,RETICCTPCT:2 in the last 72 hours  Coagulation profile No results found for this basename: INR:5,PROTIME:5 in the last 168 hours  No results found for this basename: DDIMER:2 in the  last 72 hours  Cardiac Enzymes No results found for this basename: CK:3,CKMB:3,TROPONINI:3,MYOGLOBIN:3 in the last 168 hours ------------------------------------------------------------------------------------------------------------------ No components found with this basename: POCBNP:3  Micro Results Recent Results (from the past 240 hour(s))  URINE CULTURE     Status: Normal   Collection Time   09/02/11 11:39 AM      Component Value Range Status Comment   Specimen Description URINE, CLEAN CATCH   Final    Special Requests NONE   Final    Culture  Setup Time [id number]   Final    Colony Count NO GROWTH   Final    Culture NO GROWTH   Final    Report Status 09/03/2011 FINAL   Final     Radiology Reports Ct Abdomen Pelvis Wo Contrast  08/27/2011  *RADIOLOGY REPORT*  Clinical Data: Worsening bilateral lower quadrant abdominal pain. Surgical history includes recent left superficial inguinal lymph node resection, appendectomy, renal transplant, and hernia repair.  CT ABDOMEN AND PELVIS WITHOUT CONTRAST 08/27/2011:  Technique:  Multidetector CT imaging of the abdomen and pelvis was performed following the standard protocol without intravenous contrast.  Comparison: Unenhanced CT abdomen and pelvis 08/22/2011, 08/04/2011.  Findings: Interval slight increase in size of the fluid collection in the left superficial inguinal region (overlying the common femoral artery and vein, not within the left inguinal canal), current measurements approximating 3.9 x 3.9 cm (series 2, image 97), previously 4.1 x 3.3 cm.  The fluid collection has a Hounsfield measurement of 6, indicating simple fluid.  On the prior examination, gas bubbles were present within the collection, and this is in the area of the recent lymph node resection.  Interval development of thickening of the wall of the gallbladder, with edema/inflammation surrounding the gallbladder.  No biliary ductal dilation.  Normal low dose unenhanced  appearance of the liver, spleen, and adrenal glands.  Stable mild pancreatic atrophy. Atrophic native kidneys consistent with end-stage renal disease, without focal parenchymal abnormality.  Moderate to severe aorto- iliofemoral and visceral artery atherosclerosis without aneurysm.  Lymphadenopathy in the retroperitoneum of the abdomen and both sides of the pelvis, unchanged.  Bilateral superficial inguinal lymphadenopathy, unchanged.  No new or enlarging lymphadenopathy. Index left retroperitoneal node approximately 3-4 cm above the aortic bifurcation measures approximately 2.3 x 1.7 cm (series 2, image 46).  Index right retrocrural lymph node is stable at 1.1 cm (series 2, image 19).  Stomach and small bowel normal in appearance.  Distal descending and sigmoid colon diverticulosis without evidence of acute diverticulitis.  Remainder of the colon unremarkable.  Apparent wall thickening in the cecum is felt to be related to decompression.  Lipoma again noted involving the ileocecal valve. No ascites.  Satisfactory appearance of the renal transplant in the left side of the pelvis, without evidence of hydronephrosis or focal parenchymal abnormality.  Urinary bladder unremarkable.  Prostate gland and seminal vesicles normal.  No evidence of recurrent right inguinal hernia.  Diffuse body wall edema.  Bone window images demonstrate degenerative changes involving the lower thoracic and upper lumbar spine.  Interval increase in size of the now large pericardial effusion. Interval increase in size of a small bilateral pleural effusions. Associated mild passive  atelectasis in the lower lobes.  IMPRESSION:  1.  Interval slight increase in size of the postoperative seroma or liquefied hematoma in the left superficial inguinal region at the site of prior lymph node resection. 2.  Stable retroperitoneal and bilateral superficial inguinal lymphadenopathy. 3.  CT findings consistent with acute cholecystitis, new. 4.  Interval  increase in size of a now large pericardial effusion. 5.  Interval increase in size of small bilateral pleural effusions. Associated passive atelectasis in the lower lobes, left greater than right. 6.  Distal descending and sigmoid colon diverticulosis without evidence of acute diverticulitis. 7.  Satisfactory unenhanced appearance of the renal transplant in the left side of the pelvis.  These results were called by telephone on 08/27/2011  at  1210 hours to  Dr. Maryan Rued of the emergency department, who verbally acknowledged these results.  Original Report Authenticated By: Deniece Portela, M.D.   Ct Abdomen Pelvis Wo Contrast  08/22/2011  *RADIOLOGY REPORT*  Clinical Data: Abdominal distention, constipation  CT ABDOMEN AND PELVIS WITHOUT CONTRAST  Technique:  Multidetector CT imaging of the abdomen and pelvis was performed following the standard protocol without intravenous contrast.  Comparison: 08/06/2011  Findings: There is small pericardial effusion measures 7 mm maximum thickness.  Atherosclerotic calcifications of the coronary arteries. Small left pleural effusion with left base posterior atelectasis.  NG tube with tip coiled within stomach.  Sagittal images of the spine shows degenerative changes lower thoracic spine.  The study is limited without IV contrast.  The unenhanced liver is unremarkable. Unenhanced pancreas, spleen and adrenals are unremarkable. Again noted renal atrophy and chronic renal cortical thinning. Vascular calcifications bilateral kidney are stable.  Again noted multiple enlarged retroperitoneal and mesenteric lymph nodes.  A left periaortic lymph node in axial image 37 measures 2 x 1.6 cm.  Moderate stool noted in the right colon.  Small amount of layering sludge  or  tiny gallstones are suspected within dependent gallbladder.  Atherosclerotic calcifications are again noted SMA, splenic artery bilateral renal artery origin.  Again noted a transplanted kidney in the left iliac  fossa stable in appearance without evidence of hydronephrosis or hydroureter.  Mild distended urinary bladder.  Oral contrast material was given to the patient.  No small bowel or colonic obstruction.  Enlarged lymph nodes bilateral inguinal region are again noted.  The largest right inguinal lymph node measures 1.7 x 1.9 cm.  The largest left inguinal lymph node measures 1.7 x 1.5 cm.  There is a fluid collection in the left inguinal region measures 12 HU in attenuation.  This measures 4 x 3.3 cm.  May represent a resolving seroma or hematoma.  There is thickening of the skin anterior abdominal wall and mild subcutaneous anasarca infiltration of the subcutaneous fat.  A small midline lower ventral hernia containing fat measures 3.5 cm only partially visualized.  Postsurgical changes are noted right inguinal region.  No destructive bony lesions are noted within pelvis.  IMPRESSION:  1.  There is small pericardial effusion.  Small left pleural effusion with left base posteriorly atelectasis. 2.  Again noted atrophic native kidneys.  Stable transplanted kidney in the left iliac fossa.  No hydronephrosis or hydroureter. 3.  Again noted retroperitoneal and mesenteric enlarged lymph nodes.  Bilateral enlarged inguinal lymph nodes again noted. 4.  There is a small fluid collection in the left inguinal region measures 4 x 3.3 cm.  This may represent a seroma or resolving hematoma.  Less likely inguinal abscess. 5.  Moderate  stool noted in the right colon.  No small bowel or colonic obstruction.  Original Report Authenticated By: Lahoma Crocker, M.D.   Ct Abdomen Pelvis Wo Contrast  08/04/2011  *RADIOLOGY REPORT*  Clinical Data: Recurrent renal failure requiring hemodialysis. History of renal transplant.  CT ABDOMEN AND PELVIS WITHOUT CONTRAST  Technique:  Multidetector CT imaging of the abdomen and pelvis was performed following the standard protocol without intravenous contrast.  Comparison: Right upper quadrant abdominal  ultrasound 07/14/2011. Abdominal CT 08/26/2004.  Findings: Images through the lung bases demonstrate cardiomegaly and hemodialysis catheters within the right atrium.  There is vascular congestion and mild bibasilar atelectasis.  No focal abnormalities are seen within the liver, spleen, gallbladder, pancreas or adrenal glands.  The spleen is slightly larger than on the prior CT, but without focal abnormality.  There is severe chronic cortical thinning of the native kidneys bilaterally.  Appearance is unchanged.  The transplanted kidney in the left iliac fossa appears stable without hydronephrosis, significant cortical thinning or surrounding inflammatory change.  The patient has developed multiple enlarged retroperitoneal and mesenteric lymph nodes.  Examples include an 11 mm right retrocrural node on image 16, a mesenteric node measuring 3.6 x 2.3 cm on image 31, a left periaortic node measuring 2.3 x 3.1 cm on image 46 and a 2.0 x 3.8 cm left external iliac node on image 79. In addition, there are several mildly enlarged lymph nodes within the left groin.  There are postsurgical changes in the right groin without significant adenopathy.  No bowel lesions are identified.  There is no evidence of bowel obstruction.  The bladder contents demonstrate mildly increased density.  There is no evidence of diskitis or other acute osseous abnormality.  IMPRESSION:  1.  Interval development of multi focal lymphadenopathy suspicious for lymphoma.  Enlarged lymph nodes in the left groin are most accessible for excisional biopsy. 2.  Slight interval increase in splenic size without significant splenomegaly or focal abnormality. 3.  Transplanted kidney in the left iliac fossa has a stable appearance without hydronephrosis or perinephric soft tissue stranding.  Chronic atrophy of the native kidneys appears unchanged. 4.  High density urine within the urinary bladder.  Original Report Authenticated By: Vivia Ewing, M.D.   Dg  Chest 2 View  08/02/2011  *RADIOLOGY REPORT*  Clinical Data: History of difficulty breathing.  History of fever.  CHEST - 2 VIEW  Comparison: 07/22/2011.  08/01/2011.  Findings: Right sided dialysis catheter has tips in right atrial region.  There is stable cardiac silhouette enlargement.  There is upper lobe vascular prominence with pulmonary venous hypertension vascular congestion pattern.  No consolidation or definite pleural effusion is seen.  There is slight elevation of the margin of the left hemidiaphragm with minimal basilar atelectasis. On the lateral image there appears to be some posterior atelectasis and patchy infiltrative density without consolidation.  There is minimal degenerative spondylosis.  IMPRESSION: Dialysis catheter in place.  Stable cardiac silhouette enlargement. Pulmonary venous hypertension vascular congestion pattern.  No consolidation or pleural effusion. Left basilar and posterior atelectasis and patchy infiltrative densities without consolidation.  Original Report Authenticated By: Delane Ginger, M.D.   Nm Hepatobiliary Liver Func  08/28/2011  *RADIOLOGY REPORT*  Clinical data: Abdominal pain.  Gallbladder wall thickening on ultrasound.  HEPATOBILIARY SCINTIGRAPHY WITH EJECTION FRACTION  Anterior imaging after5.47mCi Tc99M Choletec IV. There is prompt clearance of the radiopharmaceutical from the blood pool. Timely visualization of activity in central bile ducts, small bowel, and gallbladder. After 1 hour,2.3  mcg CCK was infused intravenously and imaging continued. The patient describes no pain with infusion. The calculated gallbladder ejection fraction over 30 minutes is 86% (normal >30% at  thirty minutes [facility] et al., 1992 J. Nuclear Med).  IMPRESSION 1. Patency of cystic and common bile ducts. 2. Normal gallbladder ejection fraction.  Original Report Authenticated By: Trecia Rogers, M.D.   US Abdomen Complete  08/27/2011  *RADIOLOGY REPORT*  Clinical Data:  Right upper  quadrant pain, nausea; history of kidney transplant  ABDOMINAL ULTRASOUND COMPLETE  Comparison:  None.  Findings:  Gallbladder:   No gallstones are identified at but there is gallbladder wall thickening and pericholecystic fluid.  Common Bile Duct:  Within normal limits in caliber. Measures 3 mm.  Liver: No focal mass lesion identified.  Within normal limits in parenchymal echogenicity.  IVC:  Appears normal.  Pancreas:  No abnormality identified.  Spleen:  Within normal limits in size and echotexture.  Right kidney:  Atrophic. No evidence of mass or hydronephrosis.  Left kidney: Atrophic.  No evidence of mass or hydronephrosis.  The left lower quadrant transplanted kidney is not imaged.  Abdominal Aorta:  No aneurysm identified.  IMPRESSION: Acalculous cholecystitis with gallbladder wall thickening and pericholecystic fluid.  Original Report Authenticated By: Duayne Cal, M.D.   US Biopsy  08/05/2011  *RADIOLOGY REPORT*  Clinical history:46 year old with history of renal transplant and fevers.  The patient has lymphadenopathy in the retroperitoneum and left inguinal region.  PROCEDURE(S): ULTRASOUND GUIDED BIOPSY OF LEFT INGUINAL LYMPH NODE.  Physician: Stephan Minister. Henn, MD  Medications:None  Moderate sedation time:None  Fluoroscopy time: None  Procedure:The procedure was explained to the patient.  The risks and benefits of the procedure were discussed and the patient's questions were addressed.  Informed consent was obtained from the patient.  The left inguinal area was evaluated with ultrasound. Enlarged lymph nodes were identified.  Prominent lymph node anterior to the saphenofemoral junction of the left common femoral vein was targeted.  The left groin was prepped and draped in a sterile fashion.  The skin was anesthetized with 1% lidocaine.  18 gauge core needle was directed into the lymph node with ultrasound guidance.  Five core biopsies were obtained.  Samples placed in saline.  Specimens sent for  pathology and microbiology.  Findings:Multiple enlarged left inguinal lymph nodes.  Prominent lymph node anterior to the saphenofemoral junction was biopsied. No significant bleeding following the core biopsies.  Complications: None  Impression:Ultrasound guided core biopsies of a large left inguinal lymph node.  Samples sent for pathology and microbiology.  Original Report Authenticated By: Markus Daft, M.D.   Dg Chest Port 1 View  08/31/2011  *RADIOLOGY REPORT*  Clinical Data: Shortness of breath and chest pressure.  Dialysis.  PORTABLE CHEST - 1 VIEW  Comparison: 08/23/2011  Findings: Marked cardiac enlargement.  Mild vascular congestion without overt failure or focal infiltrates.  New left base process, likely pleural effusion, with left lower lobe retrocardiac subsegmental atelectasis, not appreciated on priors.  Dialysis catheter good position and unchanged from priors.  No pneumothorax.  IMPRESSION: Marked cardiac enlargement with slight vascular congestion. New left pleural effusion with left base   subsegmental atelectasis.  Original Report Authenticated By: Staci Righter, M.D.   Dg Chest Portable 1 View  08/23/2011  *RADIOLOGY REPORT*  Clinical Data: Shortness of breath  PORTABLE CHEST - 1 VIEW  Comparison: 08/03/2011  Findings: Cardiomegaly.  Central vascular congestion. Mild opacity at the left lung base.  Otherwise,  no focal consolidation.  No overt edema.  No pleural effusion or pneumothorax.  Large bore right subclavian catheter tip projects over the cavoatrial junction.  No acute osseous abnormality identified.  IMPRESSION: Cardiomegaly.  Vascular congestion without overt edema.  Mild left lung base opacity; atelectasis versus infiltrate.  Original Report Authenticated By: Suanne Marker, M.D.   Dg Chest Port 1 View  08/03/2011  *RADIOLOGY REPORT*  Clinical Data: Cough and shortness of breath.  PORTABLE CHEST - 1 VIEW  Comparison: 08/12/2011.  Findings: The diatek catheter is stable.  The  heart is enlarged but stable.  The lungs are better aerated with resolving edema.  No effusions.  IMPRESSION: Slight improved lung aeration.  Vascular congestion without pulmonary edema.  Original Report Authenticated By: P. Kalman Jewels, M.D.   Dg Chest Port 1 View  08/01/2011  *RADIOLOGY REPORT*  Clinical Data: Central line placement.  PORTABLE CHEST - 1 VIEW  Comparison: 07/22/2011.  Findings: 1504 hours.  Right IJ dialysis catheter tips are present within the right atrium.  There is stable cardiomegaly with chronic vascular congestion.  No confluent airspace opacity, significant pleural effusion or pneumothorax is seen.  IMPRESSION: Dialysis catheter placement without demonstrated pneumothorax. Stable cardiomegaly and vascular congestion.  Original Report Authenticated By: Vivia Ewing, M.D.   Dg Abd Acute W/chest  08/27/2011  *RADIOLOGY REPORT*  Clinical Data: Abdominal pain and shortness of breath.  ACUTE ABDOMEN SERIES (ABDOMEN 2 VIEW & CHEST 1 VIEW)  Comparison: Chest x-ray fourth 32,013.  Findings: The upright chest x-ray demonstrates stable cardiac enlargement.  The right IJ diatek catheter is stable.  There is vascular congestion and mild pulmonary edema with small pleural effusions.  Two views of the abdomen demonstrate an unremarkable bowel gas pattern.  There is scattered air and stool in the colon.  No distended small bowel loops to suggest obstruction.  No free air. The bony structures are intact.  IMPRESSION:  1.  Cardiac enlargement with vascular congestion, mild interstitial edema and small effusions. 2.  No plain film findings for an acute abdominal process.  Original Report Authenticated By: P. Kalman Jewels, M.D.   Dg Abd Acute W/chest  08/22/2011  *RADIOLOGY REPORT*  Clinical Data: Abdominal pain and constipation.  ACUTE ABDOMEN SERIES (ABDOMEN 2 VIEW & CHEST 1 VIEW)  Comparison: Chest x-ray 08/03/2011.  Abdominal x-ray 04/06/2008.  Findings: There is a right internal jugular  PermCath with the tips in the right atrium.  Lung volumes are normal.  Minimal dependent bibasilar opacities are favored to reflect areas of subsegmental atelectasis.  No definite pleural effusions.  Mild pulmonary venous congestion without frank pulmonary edema.  Heart size is mildly enlarged (unchanged).  Mediastinal contours are unremarkable. Atherosclerotic calcifications within the arch of the aorta.  No pneumoperitoneum  Supine and upright views of the abdomen demonstrate gas and stool scattered throughout the colon extending to the level of the distal rectum.  There are some borderline and mildly dilated loops of gas- filled small bowel (up to 4.2 cm in diameter) throughout the central and upper abdomen.  No definite air-fluid levels are noted on the upright projection.  IMPRESSION: 1.  Nonspecific bowel gas pattern, as above.  Findings may simply reflect an ileus, however, clinical correlation for signs and symptoms of early or partial small bowel obstruction is recommended. 2.  No pneumoperitoneum. 3.  No radiographic evidence of acute cardiopulmonary disease. 4.  Mild cardiomegaly is unchanged. 5.  Support apparatus, as above.  Original Report Authenticated By: Quillian Quince  W. Weber Cooks, M.D.    Scheduled Meds:    . aspirin  81 mg Oral Daily  . calcium acetate  667 mg Oral TID WC  . cycloSPORINE modified  75 mg Oral BID  . darbepoetin (ARANESP) injection - DIALYSIS  100 mcg Intravenous Q Thu-HD  . feeding supplement  1 Container Oral TID BM  . ferric gluconate (FERRLECIT/NULECIT) IV  125 mg Intravenous Weekly  . HYDROcodone-acetaminophen  1 tablet Oral BID  . HYDROmorphone      . insulin aspart  0-5 Units Subcutaneous QHS  . insulin aspart  0-9 Units Subcutaneous TID WC  . insulin glargine  25 Units Subcutaneous Daily  . labetalol  100 mg Oral QHS  . levothyroxine  75 mcg Oral QAC breakfast  . multivitamin  1 tablet Oral Daily  . paricalcitol  2 mcg Intravenous Q T,Th,Sa-HD  .  piperacillin-tazobactam (ZOSYN)  IV  2.25 g Intravenous Q8H  . polyethylene glycol  17 g Oral BID  . predniSONE  5 mg Oral Q breakfast   Continuous Infusions:  PRN Meds:.sodium chloride, sodium chloride, albumin human, albuterol, bisacodyl, diphenhydrAMINE, heparin, HYDROmorphone (DILAUDID) injection, lidocaine, lidocaine-prilocaine, nitroGLYCERIN, ondansetron (ZOFRAN) IV, ondansetron, pentafluoroprop-tetrafluoroeth  Assessment & Plan   1. Abdominal pain  CT scan showed findings suggestive of possible acute cholecystitis , however HIDA scan was negative, most likely pain was secondary to ischemic colitis from hyperperfusion caused by low blood pressures during dialysis, GI following and colonic biopsies are consistent with the same. Currently feels better from abdominal pain standpoint , continue bowel regimen as recommended by GI . For now GI and surgery has signed off.  Patient has repeat left-sided abdominal pain at the site of his transplant kidney on 09/02/2011, UA is questionable for UTI cultures are pending, patient has been kept on empiric Zosyn, his left flank transplant kidney site tenderness continues, however no evidence of transplant kidney infection or acute change was found on abdominal CT done yesterday, we'll continue to monitor CBC and signs of infection, there is a possibility that this is acute graft rejection in the making have discussed his case with [facility] kidney transplant surgeon Dr. Linward Foster cell phone number [phone] [facility] transfer Center phone number [phone]. Dr. Linward Foster agrees that patient might need nephrectomy he requests and renal ultrasound today, who we will call him again in the morning and patient may get transferred to [facility] in the morning. Also discussed the case with Dr. Posey Pronto nephrologist who agrees that patient might need to go to [facility] for nephrectomy.    2. ESRD (end stage renal disease) - appears to have failure of  transplanted kidneys, Renal serviceis following. Cont dialysis TTS.  Cont prednisone 5 mg and cyclosporin 75 mg (dose recently decreased). Outpatient tapering to be continued.    3.Diabetes mellitus - Patient was on insulin pump as outpatient which is currently turned off, started Lantus 25 units daily was added SSI monitor CBGs and adjust PRN . Diabetic coordinator is following.  CBG (last 3)   Basename 09/04/11 1133 09/04/11 0747 09/04/11  GLUCAP 170* 102* 134*     Lab Results  Component Value Date   HGBA1C 6.8* 08/23/2011      4. Hypertension: Blood pressure better, continue to monitor blood pressures on the decreased Labetalol dose and will also add parameters to hold for SBP<110,HR,60.     5.Obstructive sleep apnea -  Continue CPAP, as tolerated by the patient.    6.ACUTE DIASTOLIC CHF (congestive heart failure)/pericardial  effusion -worsened by reduced fluid removal during dialysis treatment which was due to his low blood pressures, pressures have been better patient back on dialysis schedule, had extra dialysis on 08/31/2011 for excess fluid removal, now shortness of breath is completely resolved, cardiology following, pericardial effusion of no hemodynamic significance per cardiology.     7.Hypothyroidism: -Continue Synthroid, elevated TSH but normal free T4 level will keep on current dose of synthroid . Outpatient TSH monitoring.    8.Anemia -anemia of chronic disease - Aranesp as per renal ,  H&H is stable.    9.Right lower extremity ulcer/chronic erythema & S/P BKA (below knee amputation)   Wound care follow up.       CODE STATUS: Full code for now however stated that he he does not wish to be on prolonged life support   DVT Prophylaxis SCDs - patient remains noncompliant was counseled about the risks of DVT, PE and death.    Updated wife bedside on 08/31/2011.  [name] M.D on 09/04/2011 at 11:44 AM  [facility] Office   [phone]

## 2011-09-04 NOTE — Progress Notes (Signed)
I have seen and examined this patient and agree with the assessment/plan as outlined above by [facility] PA. Anticipated t/fer to [facility] for eval as to whether he needs allograft nephrectomy for chronic/recurrent allograft pain. Ladarion Munyon K.,MD 09/04/2011 12:35 PM

## 2011-09-04 NOTE — Progress Notes (Signed)
@   Subjective:  No chest pain; mild dyspnea when supine.  Rhythm stable NSR. Tolerated HD last night okay.   Objective:  Filed Vitals:   09/03/11 1930 09/03/11 1942 09/03/11 2145 09/04/11 0622  BP: 150/77 139/76 150/60 125/55  Pulse: 89 89 89 82  Temp:  99.5 F (37.5 C) 98.9 F (37.2 C) 98.2 F (36.8 C)  TempSrc:  Oral Oral Oral  Resp: 15 18 19 18   Height:      Weight:  114.2 kg (251 lb 12.3 oz)    SpO2:  98% 100% 96%    Intake/Output from previous day:  Intake/Output Summary (Last 24 hours) at 09/04/11 0744 Last data filed at 09/03/11 1937  Gross per 24 hour  Intake    360 ml  Output   4050 ml  Net  -3690 ml    Physical Exam: Physical exam: Well-developed well-nourished in no acute distress.  Skin is warm and dry.  HEENT is normal.  Neck is supple. JVD difficult to assess Chest is clear to auscultation with normal expansion.  Cardiovascular exam is regular rate and rhythm. No paradoxical pulse using left radial pulse.   Right radial pulse historically weak according to patient. Abdominal exam tender to palpation particularly in low abdomen, abdominal wall edema Extremities s/p left BKA; trace edema and chronic skin changes RLE neuro grossly intact    Lab Results: Basic Metabolic Panel:  Basename 09/03/11 1700 09/01/11 0815  NA 131* 135  K 4.7 3.9  CL 95* 98  CO2 27 28  GLUCOSE 217* 161*  BUN 13 9  CREATININE 3.57* 2.41*  CALCIUM 8.9 8.5  MG -- --  PHOS -- 2.8   CBC:  Basename 09/03/11 1700 09/02/11 1135  WBC 7.3 7.0  NEUTROABS -- 3.7  HGB 9.0* 9.0*  HCT 30.1* 30.6*  MCV 93.2 93.0  PLT 139* 173  Telemetry: NSR.  No significant ectopy  Assessment/Plan:  1) Pericardial effusion - clinically no signs of tamponade; repeat echo  reveals moderate pericardial effusion with no signs of tamponade and no clinical signs of tamponade. Etiology of effusion unclear. Given h/o immunosuppression for renal transplant, would have ID see patient for recommendations  for further eval. Adenopathy recently noted and felt possibly related to lymphoproliferative disease due to immunosuppression. Will repeat echo 2 weeks after DC.  CT exam yesterday shows stable small-to-moderate pericardial effusion. 2) Lower abdominal pain - GI eval in progress; abnormal mucosa from cecum to sigmoid; ? Mild ischemic injury, bx pending. 3) Renal insufficiency-management per nephrology. 4) PVD-continue ASA; would benefit from a statin long term. After acute illness improves, would favor functional study as outpatient given multiple risk factors and evidence of PVD (high risk for CAD). 5) Mildly elevated TSH-management per primary care.  Dale [name] 09/04/2011, 7:44 AM

## 2011-09-04 NOTE — Progress Notes (Signed)
Subjective:  Continued pain at kidney transplant site/ had hd late last night this am sec. To staffing issues/ with 4050 cc uf wt 118 to 114kg, no sob this am Objective Vital signs in last 24 hours: Filed Vitals:   09/03/11 1942 09/03/11 2145 09/04/11 0622 09/04/11 1008  BP: 139/76 150/60 125/55 144/66  Pulse: 89 89 82 87  Temp: 99.5 F (37.5 C) 98.9 F (37.2 C) 98.2 F (36.8 C) 97.5 F (36.4 C)  TempSrc: Oral Oral Oral Oral  Resp: 18 19 18 19   Height:      Weight: 114.2 kg (251 lb 12.3 oz)     SpO2: 98% 100% 96% 98%   Weight change: 1.7 kg (3 lb 12 oz)  Intake/Output Summary (Last 24 hours) at 09/04/11 1159 Last data filed at 09/04/11 1009  Gross per 24 hour  Intake   2641 ml  Output   4050 ml  Net  -1409 ml   Labs: Basic Metabolic Panel:  Lab [id number] 1700 09/01/11 0815 08/31/11 1428 08/31/11 0601  NA 131* 135 132* --  K 4.7 3.9 3.8 --  CL 95* 98 95* --  CO2 27 28 29  --  GLUCOSE 217* 161* 113* --  BUN 13 9 18  --  CREATININE 3.57* 2.41* 3.20* --  CALCIUM 8.9 8.5 8.8 --  ALB -- -- -- --  PHOS -- 2.8 3.2 3.6   Liver Function Tests:  Lab 09/01/11 0815 08/31/11 1428 08/31/11 0601  AST -- -- --  ALT -- -- --  ALKPHOS -- -- --  BILITOT -- -- --  PROT -- -- --  ALBUMIN 2.0* 2.0* 1.9*   No results found for this basename: LIPASE:3,AMYLASE:3 in the last 168 hours No results found for this basename: AMMONIA:3 in the last 168 hours CBC:  Lab 09/03/11 1700 09/02/11 1135 09/01/11 0815 08/31/11 1428 08/31/11 0601  WBC 7.3 7.0 6.2 -- --  NEUTROABS -- 3.7 -- -- --  HGB 9.0* 9.0* 8.6* -- --  HCT 30.1* 30.6* 29.2* -- --  MCV 93.2 93.0 93.6 91.6 92.2  PLT 139* 173 138* -- --   Cardiac Enzymes: No results found for this basename: CKTOTAL:5,CKMB:5,CKMBINDEX:5,TROPONINI:5 in the last 168 hours CBG:  Lab 09/04/11 1133 09/04/11 0747 09/04/11 09/03/11 2125 09/03/11 1146  GLUCAP 170* 102* 134* 152* 210*    Iron Studies: No results found for this basename:  IRON,TIBC,TRANSFERRIN,FERRITIN in the last 72 hours Studies/Results: Ct Abdomen Pelvis W Contrast  09/02/2011  *RADIOLOGY REPORT*  Clinical Data: Left-sided abdominal pain.  History of renal transplant.  CT ABDOMEN AND PELVIS WITH CONTRAST  Technique:  Multidetector CT imaging of the abdomen and pelvis was performed following the standard protocol during bolus administration of intravenous contrast.  Contrast: 122mL OMNIPAQUE IOHEXOL 300 MG/ML  SOLN  Comparison: CT scan 08/27/2011.  Findings: Examination is limited by a body habitus.  The patient is touching the gantry which is creating significant artifact.  The lung bases demonstrates small bilateral pleural effusions and bibasilar atelectasis.  A stable small to moderate-sized pericardial effusion is noted.  The liver is unremarkable and stable.  No focal lesions or biliary dilatation.  The gallbladder is mildly contracted.  I do not see any inflammation was present on the prior CT scan.  The pancreas is unremarkable and stable.  Mild pancreatic atrophy for age.  The spleen is normal in size.  No focal lesions.  The adrenal glands are normal and stable.  The kidneys are very small and demonstrate significant atrophy.  Renal artery calcifications are noted.  The stomach, duodenum, small bowel and colon are grossly normal. No inflammatory changes or mass lesions.  No obstruction.  Stable enlarged mesenteric and retroperitoneal lymph nodes.  The aorta is normal in caliber.  Stable atherosclerotic changes.  The transplanted kidney in the left pelvis appears normal.  Normal perfusion.  No obstruction.  The bladder is normal.  The seminal vesicles and prostate gland are unremarkable.  Vas deferens calcifications are noted.  No pelvic mass, adenopathy or free pelvic fluid collections.  Stable borderline enlarged pelvic lymph nodes.  Stable enlarged inguinal lymph nodes.  There is a persistent left inguinal/groin fluid collection which is likely a liquefied hematoma.   IMPRESSION:  1.  Persistent small bilateral pleural effusions, left greater than right with overlying atelectasis. 2.  Persistent small to moderate-sized pericardial effusion. 3.  Stable mesenteric, retroperitoneal, pelvic and inguinal adenopathy. 4.  No acute intra abdominal findings. 5.  Persistent fluid collection in the left inguinal/groin area.  Original Report Authenticated By: P. Kalman Jewels, M.D.   Medications:      . aspirin  81 mg Oral Daily  . calcium acetate  667 mg Oral TID WC  . cycloSPORINE modified  75 mg Oral BID  . darbepoetin (ARANESP) injection - DIALYSIS  100 mcg Intravenous Q Thu-HD  . feeding supplement  1 Container Oral TID BM  . ferric gluconate (FERRLECIT/NULECIT) IV  125 mg Intravenous Weekly  . HYDROcodone-acetaminophen  1 tablet Oral BID  . HYDROmorphone      . insulin aspart  0-5 Units Subcutaneous QHS  . insulin aspart  0-9 Units Subcutaneous TID WC  . insulin glargine  25 Units Subcutaneous Daily  . labetalol  100 mg Oral QHS  . levothyroxine  75 mcg Oral QAC breakfast  . multivitamin  1 tablet Oral Daily  . paricalcitol  2 mcg Intravenous Q T,Th,Sa-HD  . piperacillin-tazobactam (ZOSYN)  IV  2.25 g Intravenous Q8H  . polyethylene glycol  17 g Oral BID  . predniSONE  5 mg Oral Q breakfast   I  have reviewed scheduled and prn medications.  Physical Exam: General: Alert,nad  Heart: RRR/with distant sounds no rub heard  Lungs: Sl Decreased sounds,bases, but clear  Abdomen: BS+ and nl. Protuberant,countinued point tender left lower quad. At kid . tx site Extremities: Dialysis Access: + B/T R UA AVF, 1+ bipedal edema, , chronic skin changes with ant .lower leg dressing on skin tea    Problem/Plan= 1. Abdominal pain at Left  Lower quad= site of kidney tx./Ischemic Colitis-  On Zosyn .Urine culture  No growth. Noted [facility] Team consulting [place] Transplant Team for possible transfer to [facility] for Transplant Nephrectomy secondary to continued pain  over transplasnt site . 2.Pericardial effusion = no evidence of tamponade; cardiology following;  3.ESRD/s/p failed transplant - (TTS SWAF);on cyclosporine 75 bid and prednisone 5; Required extra dialysis past 2 days due to volume overload and CHF;.  4 liters uf last night feeling bettter Use albumin for bp support/uf keeping sbp 110 or higher.  4. Anemia - Aranesp 164mcg qwk and weekly IV Fe; follow trend 9.0 hgb yesterday  5. Secondary hyperparathyroidism - on phoslo; diet intake poor; cont Zemplar @ 2 mcg q HD;follow ca/phos closely phos 2.8 Phoslo 2 decr. To 1 with meals 6. HTN/volume - hypotensive during HD; recent decreased dose labetalol; pain meds factor; incidental pericardial effusion on Echo; use albumin 25% prn during HD and cont attempt max UF  7. Nutrition - albumin 1.9; diet advanced; has gastroparesis and some nausea; ^ high protein diet and encourage more intake w/ resource (^ protein CL supplement)  8. DM/gastroparesis- (has insulin pump at home); CBG and SSI while inpt; HgbA1c 6.8%; not getting his reglan; GI signed off yesterday.  9. Venous/Vascular insufficiency- wound care RN following  10. Hypothyroidism- on synthroid; TSH 5.48 on adm; defer primary  12. OSA/Obesity- wears CPAP at home but not here"'not comfortable as one at home"/ dw pt. And wife to bring his CPAP from home for use here she "forgot '' last night Ernest Haber, PA-C [place] [phone] 09/04/2011,11:59 AM  LOS: 8 days

## 2011-09-05 DIAGNOSIS — D631 Anemia in chronic kidney disease: Secondary | ICD-10-CM

## 2011-09-05 DIAGNOSIS — N039 Chronic nephritic syndrome with unspecified morphologic changes: Secondary | ICD-10-CM

## 2011-09-05 DIAGNOSIS — K810 Acute cholecystitis: Secondary | ICD-10-CM

## 2011-09-05 DIAGNOSIS — I318 Other specified diseases of pericardium: Secondary | ICD-10-CM

## 2011-09-05 DIAGNOSIS — N186 End stage renal disease: Secondary | ICD-10-CM

## 2011-09-05 LAB — GLUCOSE, CAPILLARY
Glucose-Capillary: 45 mg/dL — ABNORMAL LOW (ref 70–99)
Glucose-Capillary: 51 mg/dL — ABNORMAL LOW (ref 70–99)
Glucose-Capillary: 53 mg/dL — ABNORMAL LOW (ref 70–99)
Glucose-Capillary: 93 mg/dL (ref 70–99)
Glucose-Capillary: 266 mg/dL — ABNORMAL HIGH (ref 70–99)
Glucose-Capillary: 278 mg/dL — ABNORMAL HIGH (ref 70–99)
Glucose-Capillary: 189 mg/dL — ABNORMAL HIGH (ref 70–99)
Glucose-Capillary: 262 mg/dL — ABNORMAL HIGH (ref 70–99)

## 2011-09-05 MED ORDER — GLUCOSE-VITAMIN C 4-6 GM-MG PO CHEW
CHEWABLE_TABLET | ORAL | Status: AC
  Administered 2011-09-05: 4
  Filled 2011-09-05: qty 1

## 2011-09-05 MED ORDER — INSULIN GLARGINE 100 UNIT/ML ~~LOC~~ SOLN
18.0000 [IU] | Freq: Every day | SUBCUTANEOUS | Status: DC
  Administered 2011-09-05: 18 [IU] via SUBCUTANEOUS

## 2011-09-05 MED ORDER — HEPARIN SODIUM (PORCINE) 1000 UNIT/ML DIALYSIS
20.0000 [IU]/kg | INTRAMUSCULAR | Status: DC | PRN
  Filled 2011-09-05: qty 3

## 2011-09-05 MED ORDER — GLUCAGON HCL (RDNA) 1 MG IJ SOLR
INTRAMUSCULAR | Status: AC
  Administered 2011-09-05: 1 mg
  Filled 2011-09-05: qty 1

## 2011-09-05 NOTE — Progress Notes (Signed)
ANTIBIOTIC CONSULT NOTE - INITIAL  Pharmacy Consult for Zosyn Indication: Empiric coverage for possible infection of L kidney transplant  Allergies  Allergen Reactions  . Amoxicillin Other (See Comments)    Reaction unknown  . Ciprofloxacin Other (See Comments)    Reaction unknown  . Clindamycin/Lincomycin Other (See Comments)    Reaction unknown  . Codeine Other (See Comments)    Reaction unknown. But pt has tolerated hydrocodone in Vicodin and Norco in the past  . Levofloxacin Other (See Comments)    Reaction unknown  . Morphine And Related     nausea  . Vasotec Cough    Patient Measurements: Height: 5\' 8"  (172.7 cm) Weight: 251 lb 1.7 oz (113.9 kg) IBW/kg (Calculated) : 68.4   Vital Signs: Temp: 97.7 F (36.5 C) (05/13 0512) Temp src: Oral (05/13 0512) BP: 147/64 mmHg (05/13 0512) Pulse Rate: 85  (05/13 0512) Intake/Output from previous day: 05/12 0701 - 05/13 0700 In: 954 [P.O.:954] Out: 3 [Stool:3] Intake/Output from this shift:    Labs:  Basename 09/03/11 1700 09/02/11 1135  WBC 7.3 7.0  HGB 9.0* 9.0*  PLT 139* 173  LABCREA -- --  CREATININE 3.57* --   Estimated Creatinine Clearance: 32 ml/min (by C-G formula based on Cr of 3.57). No results found for this basename: VANCOTROUGH:2,VANCOPEAK:2,VANCORANDOM:2,GENTTROUGH:2,GENTPEAK:2,GENTRANDOM:2,TOBRATROUGH:2,TOBRAPEAK:2,TOBRARND:2,AMIKACINPEAK:2,AMIKACINTROU:2,AMIKACIN:2, in the last 72 hours   Microbiology: Recent Results (from the past 720 hour(s))  MRSA PCR SCREENING     Status: Normal   Collection Time   08/23/11  9:42 AM      Component Value Range Status Comment   MRSA by PCR NEGATIVE  NEGATIVE  Final   URINE CULTURE     Status: Normal   Collection Time   09/02/11 11:39 AM      Component Value Range Status Comment   Specimen Description URINE, CLEAN CATCH   Final    Special Requests NONE   Final    Culture  Setup Time [id number]   Final    Colony Count NO GROWTH   Final    Culture NO GROWTH    Final    Report Status 09/03/2011 FINAL   Final     Medical History: Past Medical History  Diagnosis Date  . Insulin dependent diabetes mellitus   . Hypertension   . History of blood transfusion   . ESRD (end stage renal disease)     a. 1995 s/p cadaveric transplant w/ susbequent failure after 18 yrs;  b.Dialysis initiated 07/2011  . Dyspnea     a. 07/23/11 Echo: EF 55-60%  . Anemia of chronic disease   . Cholecystitis     a. 08/27/2011  . Constipation   . Pericardial effusion     a.  Small by CT 08/22/11;  b.  Large by CT 08/27/11  . Inguinal lymphadenopathy     a. bilateral - s/p biopsy 07/2011    Assessment: 46 y.o. M with ESRD on empiric Zosyn for possible infection of L kidney transplant. Ultrasound of post-transplant kidney site shows soft tissue edema. The patient has an allergy to amoxicillin as "rash" however the patient is tolerating Zosyn well this admission without any signs/symtpoms of rash. The patient receives HD T/Th/Sat. Afebrile, WBC WNL. Urine Cx NG. Noted plans to continue Zosyn at this time and for possible transfer to [facility] for further transplant care. Zosyn dose remains appropriate.   Goal of Therapy:  Eradication of Infection  Plan:  1. Zosyn 2.25g IV every 8 hours 2. Will continue to follow  HD schedule/duration, culture results, LOT, and antibiotic de-escalation plans   Alycia Rossetti, PharmD, BCPS Clinical Pharmacist Pager: [phone] 09/05/2011 11:20 AM

## 2011-09-05 NOTE — Progress Notes (Signed)
Patient Demographics  Dale [name], is a 46 y.o. male  [id number]  [id number]  DOB - 18-Feb-1966  Admit date - 08/27/2011  Admitting Physician Silvestre Moment, MD  Outpatient Primary MD for the patient is Windy Kalata, MD, MD  LOS - 9     Chief Complaint  Patient presents with  . Shortness of Breath        Subjective:   Dale [name] today has, No headache, No chest pain,  No Nausea, No new weakness tingling or numbness, No Cough - SOB is much better,  he is having pain at his left kidney transplant site.  Objective:   Filed Vitals:   09/04/11 1354 09/04/11 1657 09/04/11 2048 09/05/11 0512  BP: 125/57 127/70 150/65 147/64  Pulse: 81 91 92 85  Temp: 98 F (36.7 C) 97.8 F (36.6 C) 98.3 F (36.8 C) 97.7 F (36.5 C)  TempSrc: Oral Oral Oral Oral  Resp: 18 19 20 18   Height:      Weight:   113.9 kg (251 lb 1.7 oz)   SpO2: 100% 90% 94% 95%    Wt Readings from Last 3 Encounters:  09/04/11 113.9 kg (251 lb 1.7 oz)  09/04/11 113.9 kg (251 lb 1.7 oz)  09/04/11 113.9 kg (251 lb 1.7 oz)     Intake/Output Summary (Last 24 hours) at 09/05/11 0951 Last data filed at 09/05/11 0300  Gross per 24 hour  Intake    954 ml  Output      3 ml  Net    951 ml    Exam Awake Alert, Oriented *3, No new F.N deficits, Normal affect [place].AT,PERRAL Supple Neck,No JVD, No cervical lymphadenopathy appriciated.  Symmetrical Chest wall movement, Good air movement bilaterally, 2 bibasilar Rales RRR,No Gallops,Rubs or new Murmurs, No Parasternal Heave +ve B.Sounds, Abd Soft but maybe slightly distended, No organomegaly appriciated, No rebound -guarding or rigidity, and left renal transplant site is little tender and warm No Cyanosis, Clubbing or edema, No new Rash or bruise, left BKA  Data Review  CBC  Lab 09/03/11 1700 09/02/11 1135 09/01/11 0815 08/31/11 1428 08/31/11 0601  WBC 7.3 7.0 6.2 6.9 7.0  HGB 9.0* 9.0* 8.6* 8.8* 8.8*  HCT 30.1* 30.6* 29.2* 29.5* 29.7*  PLT 139*  173 138* 180 185  MCV 93.2 93.0 93.6 91.6 92.2  MCH 27.9 27.4 27.6 27.3 27.3  MCHC 29.9* 29.4* 29.5* 29.8* 29.6*  RDW 20.5* 20.8* 20.8* 20.6* 20.7*  LYMPHSABS -- 1.9 -- -- --  MONOABS -- 0.8 -- -- --  EOSABS -- 0.6 -- -- --  BASOSABS -- 0.1 -- -- --  BANDABS -- -- -- -- --    Chemistries   Lab 09/03/11 1700 09/01/11 0815 08/31/11 1428 08/31/11 0601 08/30/11 0720  NA 131* 135 132* 132* 132*  K 4.7 3.9 3.8 3.9 4.1  CL 95* 98 95* 97 98  CO2 27 28 29 28 24   GLUCOSE 217* 161* 113* 175* 218*  BUN 13 9 18 17  35*  CREATININE 3.57* 2.41* 3.20* 3.00* 4.15*  CALCIUM 8.9 8.5 8.8 8.6 8.5  MG -- -- -- -- --  AST -- -- -- -- --  ALT -- -- -- -- --  ALKPHOS -- -- -- -- --  BILITOT -- -- -- -- --   ------------------------------------------------------------------------------------------------------------------ estimated creatinine clearance is 32 ml/min (by C-G formula based on Cr of 3.57). ------------------------------------------------------------------------------------------------------------------ No results found for this basename: HGBA1C:2 in the last 72 hours ------------------------------------------------------------------------------------------------------------------ No results found for this  basename: CHOL:2,HDL:2,LDLCALC:2,TRIG:2,CHOLHDL:2,LDLDIRECT:2 in the last 72 hours ------------------------------------------------------------------------------------------------------------------ No results found for this basename: TSH,T4TOTAL,FREET3,T3FREE,THYROIDAB in the last 72 hours ------------------------------------------------------------------------------------------------------------------ No results found for this basename: VITAMINB12:2,FOLATE:2,FERRITIN:2,TIBC:2,IRON:2,RETICCTPCT:2 in the last 72 hours  Coagulation profile No results found for this basename: INR:5,PROTIME:5 in the last 168 hours  No results found for this basename: DDIMER:2 in the last 72 hours  Cardiac  Enzymes No results found for this basename: CK:3,CKMB:3,TROPONINI:3,MYOGLOBIN:3 in the last 168 hours ------------------------------------------------------------------------------------------------------------------ No components found with this basename: POCBNP:3  Micro Results Recent Results (from the past 240 hour(s))  URINE CULTURE     Status: Normal   Collection Time   09/02/11 11:39 AM      Component Value Range Status Comment   Specimen Description URINE, CLEAN CATCH   Final    Special Requests NONE   Final    Culture  Setup Time [id number]   Final    Colony Count NO GROWTH   Final    Culture NO GROWTH   Final    Report Status 09/03/2011 FINAL   Final     Radiology Reports Ct Abdomen Pelvis Wo Contrast  08/27/2011  *RADIOLOGY REPORT*  Clinical Data: Worsening bilateral lower quadrant abdominal pain. Surgical history includes recent left superficial inguinal lymph node resection, appendectomy, renal transplant, and hernia repair.  CT ABDOMEN AND PELVIS WITHOUT CONTRAST 08/27/2011:  Technique:  Multidetector CT imaging of the abdomen and pelvis was performed following the standard protocol without intravenous contrast.  Comparison: Unenhanced CT abdomen and pelvis 08/22/2011, 08/04/2011.  Findings: Interval slight increase in size of the fluid collection in the left superficial inguinal region (overlying the common femoral artery and vein, not within the left inguinal canal), current measurements approximating 3.9 x 3.9 cm (series 2, image 97), previously 4.1 x 3.3 cm.  The fluid collection has a Hounsfield measurement of 6, indicating simple fluid.  On the prior examination, gas bubbles were present within the collection, and this is in the area of the recent lymph node resection.  Interval development of thickening of the wall of the gallbladder, with edema/inflammation surrounding the gallbladder.  No biliary ductal dilation.  Normal low dose unenhanced appearance of the liver, spleen,  and adrenal glands.  Stable mild pancreatic atrophy. Atrophic native kidneys consistent with end-stage renal disease, without focal parenchymal abnormality.  Moderate to severe aorto- iliofemoral and visceral artery atherosclerosis without aneurysm.  Lymphadenopathy in the retroperitoneum of the abdomen and both sides of the pelvis, unchanged.  Bilateral superficial inguinal lymphadenopathy, unchanged.  No new or enlarging lymphadenopathy. Index left retroperitoneal node approximately 3-4 cm above the aortic bifurcation measures approximately 2.3 x 1.7 cm (series 2, image 46).  Index right retrocrural lymph node is stable at 1.1 cm (series 2, image 19).  Stomach and small bowel normal in appearance.  Distal descending and sigmoid colon diverticulosis without evidence of acute diverticulitis.  Remainder of the colon unremarkable.  Apparent wall thickening in the cecum is felt to be related to decompression.  Lipoma again noted involving the ileocecal valve. No ascites.  Satisfactory appearance of the renal transplant in the left side of the pelvis, without evidence of hydronephrosis or focal parenchymal abnormality.  Urinary bladder unremarkable.  Prostate gland and seminal vesicles normal.  No evidence of recurrent right inguinal hernia.  Diffuse body wall edema.  Bone window images demonstrate degenerative changes involving the lower thoracic and upper lumbar spine.  Interval increase in size of the now large pericardial effusion. Interval increase in size of a small bilateral  pleural effusions. Associated mild passive atelectasis in the lower lobes.  IMPRESSION:  1.  Interval slight increase in size of the postoperative seroma or liquefied hematoma in the left superficial inguinal region at the site of prior lymph node resection. 2.  Stable retroperitoneal and bilateral superficial inguinal lymphadenopathy. 3.  CT findings consistent with acute cholecystitis, new. 4.  Interval increase in size of a now large  pericardial effusion. 5.  Interval increase in size of small bilateral pleural effusions. Associated passive atelectasis in the lower lobes, left greater than right. 6.  Distal descending and sigmoid colon diverticulosis without evidence of acute diverticulitis. 7.  Satisfactory unenhanced appearance of the renal transplant in the left side of the pelvis.  These results were called by telephone on 08/27/2011  at  1210 hours to  Dr. Maryan Rued of the emergency department, who verbally acknowledged these results.  Original Report Authenticated By: Deniece Portela, M.D.   Ct Abdomen Pelvis Wo Contrast  08/22/2011  *RADIOLOGY REPORT*  Clinical Data: Abdominal distention, constipation  CT ABDOMEN AND PELVIS WITHOUT CONTRAST  Technique:  Multidetector CT imaging of the abdomen and pelvis was performed following the standard protocol without intravenous contrast.  Comparison: 08/06/2011  Findings: There is small pericardial effusion measures 7 mm maximum thickness.  Atherosclerotic calcifications of the coronary arteries. Small left pleural effusion with left base posterior atelectasis.  NG tube with tip coiled within stomach.  Sagittal images of the spine shows degenerative changes lower thoracic spine.  The study is limited without IV contrast.  The unenhanced liver is unremarkable. Unenhanced pancreas, spleen and adrenals are unremarkable. Again noted renal atrophy and chronic renal cortical thinning. Vascular calcifications bilateral kidney are stable.  Again noted multiple enlarged retroperitoneal and mesenteric lymph nodes.  A left periaortic lymph node in axial image 37 measures 2 x 1.6 cm.  Moderate stool noted in the right colon.  Small amount of layering sludge  or  tiny gallstones are suspected within dependent gallbladder.  Atherosclerotic calcifications are again noted SMA, splenic artery bilateral renal artery origin.  Again noted a transplanted kidney in the left iliac fossa stable in appearance without  evidence of hydronephrosis or hydroureter.  Mild distended urinary bladder.  Oral contrast material was given to the patient.  No small bowel or colonic obstruction.  Enlarged lymph nodes bilateral inguinal region are again noted.  The largest right inguinal lymph node measures 1.7 x 1.9 cm.  The largest left inguinal lymph node measures 1.7 x 1.5 cm.  There is a fluid collection in the left inguinal region measures 12 HU in attenuation.  This measures 4 x 3.3 cm.  May represent a resolving seroma or hematoma.  There is thickening of the skin anterior abdominal wall and mild subcutaneous anasarca infiltration of the subcutaneous fat.  A small midline lower ventral hernia containing fat measures 3.5 cm only partially visualized.  Postsurgical changes are noted right inguinal region.  No destructive bony lesions are noted within pelvis.  IMPRESSION:  1.  There is small pericardial effusion.  Small left pleural effusion with left base posteriorly atelectasis. 2.  Again noted atrophic native kidneys.  Stable transplanted kidney in the left iliac fossa.  No hydronephrosis or hydroureter. 3.  Again noted retroperitoneal and mesenteric enlarged lymph nodes.  Bilateral enlarged inguinal lymph nodes again noted. 4.  There is a small fluid collection in the left inguinal region measures 4 x 3.3 cm.  This may represent a seroma or resolving hematoma.  Less likely  inguinal abscess. 5.  Moderate stool noted in the right colon.  No small bowel or colonic obstruction.  Original Report Authenticated By: Lahoma Crocker, M.D.   Ct Abdomen Pelvis Wo Contrast  08/04/2011  *RADIOLOGY REPORT*  Clinical Data: Recurrent renal failure requiring hemodialysis. History of renal transplant.  CT ABDOMEN AND PELVIS WITHOUT CONTRAST  Technique:  Multidetector CT imaging of the abdomen and pelvis was performed following the standard protocol without intravenous contrast.  Comparison: Right upper quadrant abdominal ultrasound 07/14/2011. Abdominal CT  08/26/2004.  Findings: Images through the lung bases demonstrate cardiomegaly and hemodialysis catheters within the right atrium.  There is vascular congestion and mild bibasilar atelectasis.  No focal abnormalities are seen within the liver, spleen, gallbladder, pancreas or adrenal glands.  The spleen is slightly larger than on the prior CT, but without focal abnormality.  There is severe chronic cortical thinning of the native kidneys bilaterally.  Appearance is unchanged.  The transplanted kidney in the left iliac fossa appears stable without hydronephrosis, significant cortical thinning or surrounding inflammatory change.  The patient has developed multiple enlarged retroperitoneal and mesenteric lymph nodes.  Examples include an 11 mm right retrocrural node on image 16, a mesenteric node measuring 3.6 x 2.3 cm on image 31, a left periaortic node measuring 2.3 x 3.1 cm on image 46 and a 2.0 x 3.8 cm left external iliac node on image 79. In addition, there are several mildly enlarged lymph nodes within the left groin.  There are postsurgical changes in the right groin without significant adenopathy.  No bowel lesions are identified.  There is no evidence of bowel obstruction.  The bladder contents demonstrate mildly increased density.  There is no evidence of diskitis or other acute osseous abnormality.  IMPRESSION:  1.  Interval development of multi focal lymphadenopathy suspicious for lymphoma.  Enlarged lymph nodes in the left groin are most accessible for excisional biopsy. 2.  Slight interval increase in splenic size without significant splenomegaly or focal abnormality. 3.  Transplanted kidney in the left iliac fossa has a stable appearance without hydronephrosis or perinephric soft tissue stranding.  Chronic atrophy of the native kidneys appears unchanged. 4.  High density urine within the urinary bladder.  Original Report Authenticated By: Vivia Ewing, M.D.   Dg Chest 2 View  08/02/2011   *RADIOLOGY REPORT*  Clinical Data: History of difficulty breathing.  History of fever.  CHEST - 2 VIEW  Comparison: 07/22/2011.  08/01/2011.  Findings: Right sided dialysis catheter has tips in right atrial region.  There is stable cardiac silhouette enlargement.  There is upper lobe vascular prominence with pulmonary venous hypertension vascular congestion pattern.  No consolidation or definite pleural effusion is seen.  There is slight elevation of the margin of the left hemidiaphragm with minimal basilar atelectasis. On the lateral image there appears to be some posterior atelectasis and patchy infiltrative density without consolidation.  There is minimal degenerative spondylosis.  IMPRESSION: Dialysis catheter in place.  Stable cardiac silhouette enlargement. Pulmonary venous hypertension vascular congestion pattern.  No consolidation or pleural effusion. Left basilar and posterior atelectasis and patchy infiltrative densities without consolidation.  Original Report Authenticated By: Delane Ginger, M.D.   Nm Hepatobiliary Liver Func  08/28/2011  *RADIOLOGY REPORT*  Clinical data: Abdominal pain.  Gallbladder wall thickening on ultrasound.  HEPATOBILIARY SCINTIGRAPHY WITH EJECTION FRACTION  Anterior imaging after5.75mCi Tc99M Choletec IV. There is prompt clearance of the radiopharmaceutical from the blood pool. Timely visualization of activity in central bile ducts, small bowel,  and gallbladder. After 1 hour,2.3 mcg CCK was infused intravenously and imaging continued. The patient describes no pain with infusion. The calculated gallbladder ejection fraction over 30 minutes is 86% (normal >30% at  thirty minutes [facility] et al., 1992 J. Nuclear Med).  IMPRESSION 1. Patency of cystic and common bile ducts. 2. Normal gallbladder ejection fraction.  Original Report Authenticated By: Trecia Rogers, M.D.   US Abdomen Complete  08/27/2011  *RADIOLOGY REPORT*  Clinical Data:  Right upper quadrant pain, nausea;  history of kidney transplant  ABDOMINAL ULTRASOUND COMPLETE  Comparison:  None.  Findings:  Gallbladder:   No gallstones are identified at but there is gallbladder wall thickening and pericholecystic fluid.  Common Bile Duct:  Within normal limits in caliber. Measures 3 mm.  Liver: No focal mass lesion identified.  Within normal limits in parenchymal echogenicity.  IVC:  Appears normal.  Pancreas:  No abnormality identified.  Spleen:  Within normal limits in size and echotexture.  Right kidney:  Atrophic. No evidence of mass or hydronephrosis.  Left kidney: Atrophic.  No evidence of mass or hydronephrosis.  The left lower quadrant transplanted kidney is not imaged.  Abdominal Aorta:  No aneurysm identified.  IMPRESSION: Acalculous cholecystitis with gallbladder wall thickening and pericholecystic fluid.  Original Report Authenticated By: Duayne Cal, M.D.   US Biopsy  08/05/2011  *RADIOLOGY REPORT*  Clinical history:46 year old with history of renal transplant and fevers.  The patient has lymphadenopathy in the retroperitoneum and left inguinal region.  PROCEDURE(S): ULTRASOUND GUIDED BIOPSY OF LEFT INGUINAL LYMPH NODE.  Physician: Stephan Minister. Henn, MD  Medications:None  Moderate sedation time:None  Fluoroscopy time: None  Procedure:The procedure was explained to the patient.  The risks and benefits of the procedure were discussed and the patient's questions were addressed.  Informed consent was obtained from the patient.  The left inguinal area was evaluated with ultrasound. Enlarged lymph nodes were identified.  Prominent lymph node anterior to the saphenofemoral junction of the left common femoral vein was targeted.  The left groin was prepped and draped in a sterile fashion.  The skin was anesthetized with 1% lidocaine.  18 gauge core needle was directed into the lymph node with ultrasound guidance.  Five core biopsies were obtained.  Samples placed in saline.  Specimens sent for pathology and microbiology.   Findings:Multiple enlarged left inguinal lymph nodes.  Prominent lymph node anterior to the saphenofemoral junction was biopsied. No significant bleeding following the core biopsies.  Complications: None  Impression:Ultrasound guided core biopsies of a large left inguinal lymph node.  Samples sent for pathology and microbiology.  Original Report Authenticated By: Markus Daft, M.D.   Dg Chest Port 1 View  08/31/2011  *RADIOLOGY REPORT*  Clinical Data: Shortness of breath and chest pressure.  Dialysis.  PORTABLE CHEST - 1 VIEW  Comparison: 08/23/2011  Findings: Marked cardiac enlargement.  Mild vascular congestion without overt failure or focal infiltrates.  New left base process, likely pleural effusion, with left lower lobe retrocardiac subsegmental atelectasis, not appreciated on priors.  Dialysis catheter good position and unchanged from priors.  No pneumothorax.  IMPRESSION: Marked cardiac enlargement with slight vascular congestion. New left pleural effusion with left base   subsegmental atelectasis.  Original Report Authenticated By: Staci Righter, M.D.   Dg Chest Portable 1 View  08/23/2011  *RADIOLOGY REPORT*  Clinical Data: Shortness of breath  PORTABLE CHEST - 1 VIEW  Comparison: 08/03/2011  Findings: Cardiomegaly.  Central vascular congestion. Mild opacity at the  left lung base.  Otherwise, no focal consolidation.  No overt edema.  No pleural effusion or pneumothorax.  Large bore right subclavian catheter tip projects over the cavoatrial junction.  No acute osseous abnormality identified.  IMPRESSION: Cardiomegaly.  Vascular congestion without overt edema.  Mild left lung base opacity; atelectasis versus infiltrate.  Original Report Authenticated By: Suanne Marker, M.D.   Dg Chest Port 1 View  08/03/2011  *RADIOLOGY REPORT*  Clinical Data: Cough and shortness of breath.  PORTABLE CHEST - 1 VIEW  Comparison: 08/12/2011.  Findings: The diatek catheter is stable.  The heart is enlarged but stable.   The lungs are better aerated with resolving edema.  No effusions.  IMPRESSION: Slight improved lung aeration.  Vascular congestion without pulmonary edema.  Original Report Authenticated By: P. Kalman Jewels, M.D.   Dg Chest Port 1 View  08/01/2011  *RADIOLOGY REPORT*  Clinical Data: Central line placement.  PORTABLE CHEST - 1 VIEW  Comparison: 07/22/2011.  Findings: 1504 hours.  Right IJ dialysis catheter tips are present within the right atrium.  There is stable cardiomegaly with chronic vascular congestion.  No confluent airspace opacity, significant pleural effusion or pneumothorax is seen.  IMPRESSION: Dialysis catheter placement without demonstrated pneumothorax. Stable cardiomegaly and vascular congestion.  Original Report Authenticated By: Vivia Ewing, M.D.   Dg Abd Acute W/chest  08/27/2011  *RADIOLOGY REPORT*  Clinical Data: Abdominal pain and shortness of breath.  ACUTE ABDOMEN SERIES (ABDOMEN 2 VIEW & CHEST 1 VIEW)  Comparison: Chest x-ray fourth 32,013.  Findings: The upright chest x-ray demonstrates stable cardiac enlargement.  The right IJ diatek catheter is stable.  There is vascular congestion and mild pulmonary edema with small pleural effusions.  Two views of the abdomen demonstrate an unremarkable bowel gas pattern.  There is scattered air and stool in the colon.  No distended small bowel loops to suggest obstruction.  No free air. The bony structures are intact.  IMPRESSION:  1.  Cardiac enlargement with vascular congestion, mild interstitial edema and small effusions. 2.  No plain film findings for an acute abdominal process.  Original Report Authenticated By: P. Kalman Jewels, M.D.   Dg Abd Acute W/chest  08/22/2011  *RADIOLOGY REPORT*  Clinical Data: Abdominal pain and constipation.  ACUTE ABDOMEN SERIES (ABDOMEN 2 VIEW & CHEST 1 VIEW)  Comparison: Chest x-ray 08/03/2011.  Abdominal x-ray 04/06/2008.  Findings: There is a right internal jugular PermCath with the tips in the  right atrium.  Lung volumes are normal.  Minimal dependent bibasilar opacities are favored to reflect areas of subsegmental atelectasis.  No definite pleural effusions.  Mild pulmonary venous congestion without frank pulmonary edema.  Heart size is mildly enlarged (unchanged).  Mediastinal contours are unremarkable. Atherosclerotic calcifications within the arch of the aorta.  No pneumoperitoneum  Supine and upright views of the abdomen demonstrate gas and stool scattered throughout the colon extending to the level of the distal rectum.  There are some borderline and mildly dilated loops of gas- filled small bowel (up to 4.2 cm in diameter) throughout the central and upper abdomen.  No definite air-fluid levels are noted on the upright projection.  IMPRESSION: 1.  Nonspecific bowel gas pattern, as above.  Findings may simply reflect an ileus, however, clinical correlation for signs and symptoms of early or partial small bowel obstruction is recommended. 2.  No pneumoperitoneum. 3.  No radiographic evidence of acute cardiopulmonary disease. 4.  Mild cardiomegaly is unchanged. 5.  Support apparatus, as above.  Original Report Authenticated By: Etheleen Mayhew, M.D.    Scheduled Meds:    . aspirin  81 mg Oral Daily  . calcium acetate  667 mg Oral TID WC  . cycloSPORINE modified  75 mg Oral BID  . darbepoetin (ARANESP) injection - DIALYSIS  100 mcg Intravenous Q Thu-HD  . feeding supplement  1 Container Oral TID BM  . ferric gluconate (FERRLECIT/NULECIT) IV  125 mg Intravenous Weekly  . glucagon      . glucose-Vitamin C      . HYDROcodone-acetaminophen  1 tablet Oral BID  . insulin aspart  0-9 Units Subcutaneous TID WC  . insulin glargine  18 Units Subcutaneous Daily  . labetalol  100 mg Oral QHS  . levothyroxine  75 mcg Oral QAC breakfast  . multivitamin  1 tablet Oral Daily  . paricalcitol  2 mcg Intravenous Q T,Th,Sa-HD  . piperacillin-tazobactam (ZOSYN)  IV  2.25 g Intravenous Q8H  .  polyethylene glycol  17 g Oral BID  . predniSONE  5 mg Oral Q breakfast  . DISCONTD: insulin aspart  0-5 Units Subcutaneous QHS  . DISCONTD: insulin glargine  25 Units Subcutaneous Daily   Continuous Infusions:  PRN Meds:.sodium chloride, sodium chloride, albumin human, albuterol, bisacodyl, diphenhydrAMINE, heparin, HYDROmorphone (DILAUDID) injection, lidocaine, lidocaine-prilocaine, nitroGLYCERIN, ondansetron (ZOFRAN) IV, ondansetron, pentafluoroprop-tetrafluoroeth  Assessment & Plan   Interim summary- [name] is a 46 year old gentleman with history of diabetes mellitus insulin-dependent, left BKA, renal failure status post renal transplant at [facility] in 1995, patient has long since had transplant failure requiring dialysis on a routine basis, he has been admitted several times to the hospital for made his medical problems. He was discharged from this hospital about 12 days ago following which he presented again to ER with some abdominal pain, usually there was suspicion of acute cholecystitis however it was ruled out with HIDA scan. Subsequent imaging studies and colonoscopy indicated that patient had ischemic colitis due to hypotensive episodes during dialysis.  Patient was seen by GI and general surgery, agents blood pressure medications were titrated by nephrologist with good results his blood pressures have improved and his ischemic colitis seems to be in good control.   Should about 2-3 days ago around 09/02/2011 started complaining of left lower quadrant pain around his transplant kidney site, CT scan and ultrasound did not show signs of any acute infection his UA has not grown any bugs yet, case was discussed with [facility] Dr. Linward Foster, it was decided that per his imaging studies there is some swelling and soft tissue edema in the left lower quadrant but none around the transplant kidney. I have reconsulted surgery on 09/05/2011 to evaluate if there is any soft tissue abscess. For now  Zosyn will be continued patient continues to be afebrile with normal white blood cell count.     1. Abdominal pain   CT scan showed findings suggestive of possible acute cholecystitis , however HIDA scan was negative, most likely pain was secondary to ischemic colitis from hyperperfusion caused by low blood pressures during dialysis, GI following and colonic biopsies are consistent with the same. Currently feels better from abdominal pain standpoint , continue bowel regimen as recommended by GI . For now GI and surgery has signed off.  Patient has repeat left-sided abdominal pain at the site of his transplant kidney on 09/02/2011, UA is questionable for UTI cultures are pending, patient has been kept on empiric Zosyn, his left flank transplant kidney site tenderness continues,  however no evidence of transplant kidney infection or acute change was found on abdominal CT done yesterday, we'll continue to monitor CBC and signs of infection, there is a possibility that this is acute graft rejection in the making have discussed his case with [facility] kidney transplant surgeon Dr. Linward Foster cell phone number [phone] [facility] transfer Center phone number [phone] on 09/04/2011. Dr. Linward Foster requested ultrasound of the post transplant kidney site, ultrasound showed soft tissue edema, ultrasound results were discussed with Dr. Linward Foster again on 09/05/2011 and he suggested that at this time there is no indication for nephrectomy as this appears to be more of a soft tissue problem.   I have reconsulted surgery PA Claiborne Billings to take a look at the patient to evaluate the soft tissue edema and to make sure there is no underlying abscess, patient continues to remain afebrile with normal WBC count, for now Zosyn 2 continue although I doubt there is an abscess at the site.    2. ESRD (end stage renal disease) - appears to have failure of transplanted kidneys, Renal serviceis following. Cont dialysis TTS.  Cont  prednisone 5 mg and cyclosporin 75 mg (dose recently decreased). Outpatient tapering to be continued.    3.Diabetes mellitus - Patient was on insulin pump as outpatient which is currently turned off, his Lantus and  bedtime NovoLog have been adjusted in the light of hypoglycemic event on 09/05/2011, needed pre-meal NovoLog will be added.   CBG (last 3)   Basename 09/05/11 0729 09/05/11 0246 09/05/11 0209  GLUCAP 266* 93 91*     Lab Results  Component Value Date   HGBA1C 6.8* 08/23/2011      4. Hypertension: Blood pressure better, continue to monitor blood pressures on the decreased Labetalol dose and will also add parameters to hold for SBP<110,HR,60.     5.Obstructive sleep apnea -  Continue CPAP, as tolerated by the patient.    6.ACUTE DIASTOLIC CHF (congestive heart failure)/pericardial effusion - currently resolved after fluid removal from dialysis, patient's volume status is very fragile and goes into pulmonary vascular congestion with slight stress.  For His pericardial effusion patient has been seen by cardiology and has had 2 echoes, no further interventions per cardiology for pericardial effusion.     7.Hypothyroidism: -Continue Synthroid, elevated TSH but normal free T4 level will keep on current dose of synthroid . Outpatient TSH monitoring.    8.Anemia -anemia of chronic disease - Aranesp as per renal ,  H&H is stable.    9.Right lower extremity ulcer/chronic erythema & S/P BKA (below knee amputation)   Wound care follow up.       CODE STATUS: Full code for now however stated that he he does not wish to be on prolonged life support   DVT Prophylaxis SCDs - patient remains noncompliant was counseled about the risks of DVT, PE and death.    Updated wife bedside on 08/31/2011.  [name] M.D on 09/05/2011 at 9:51 AM  [facility] Office  [phone]

## 2011-09-05 NOTE — Progress Notes (Signed)
Physical Therapy Treatment Patient Details Name: Dale [name] MRN: [id number] DOB: 1966/01/21 Today's Date: 09/05/2011 Time: [id number] PT Time Calculation (min): 21 min  PT Assessment / Plan / Recommendation Comments on Treatment Session  Pt is very concerned about pain management; He understands he must wean from IV pain meds, but is worried about it; Max encouragement to participate; Pt plans to call wife and ask her to bring his shrinker for overnight wear    Follow Up Recommendations  Home health PT;Supervision - Intermittent    Barriers to Discharge        Equipment Recommendations  None recommended by PT    Recommendations for Other Services    Frequency Min 3X/week   Plan Discharge plan remains appropriate;Frequency remains appropriate    Precautions / Restrictions Precautions Precautions: Fall Required Braces or Orthoses: Other Brace/Splint Other Brace/Splint: LLE prosthesis   Pertinent Vitals/Pain 9/10 Left flank pain after activity; RN notified    Mobility  Bed Mobility Bed Mobility: Supine to Sit;Sitting - Scoot to Edge of Bed Supine to Sit: 6: Modified independent (Device/Increase time) Sitting - Scoot to Edge of Bed: 6: Modified independent (Device/Increase time) Transfers Transfers: Sit to Stand;Stand to Sit Sit to Stand: 4: Min guard;With upper extremity assist;From bed Stand to Sit: 4: Min guard;With upper extremity assist;To bed Details for Transfer Assistance: Required assist to steady RW as pt tends to pull up on RW; Performed 9 repeat sit to/from stands for exercise, activity tolerance; Cues to try not to depend on momentum or UE push  for sit to stand -- to work on strengthening; Able to semi-control descent with reminder cues Ambulation/Gait General Gait Details: Pt politely declined amb    Exercises  Performed serial sit to/from stands for therex    PT Diagnosis:    PT Problem List:   PT Treatment Interventions:     PT Goals Acute Rehab PT  Goals Time For Goal Achievement: 09/16/11 Potential to Achieve Goals: Good Pt will go Supine/Side to Sit: with modified independence PT Goal: Supine/Side to Sit - Progress: Met Pt will go Sit to Stand: with modified independence PT Goal: Sit to Stand - Progress: Met  Visit Information  Last PT Received On: 09/05/11 Assistance Needed: +1    Subjective Data  Subjective: Feeling lousy, very painful, but agreeable for some activity with encouragement; very concerned about getting pain meds   Cognition  Overall Cognitive Status: Appears within functional limits for tasks assessed/performed Arousal/Alertness: Awake/alert Orientation Level: Oriented X4 / Intact Behavior During Session: [facility] for tasks performed    Balance     End of Session PT - End of Session Equipment Utilized During Treatment: Left lower extremity prosthesis Activity Tolerance: Patient limited by fatigue;Patient limited by pain Patient left: in bed;with call bell/phone within reach (sitting EOB) Nurse Communication: Patient requests pain meds    Roney [facility] [place], [place]  09/05/2011, 10:03 AM

## 2011-09-05 NOTE — Progress Notes (Signed)
Patient ID: Dale [name], male   DOB: 15-Jul-1965, 46 y.o.   MRN: [id number] 7 Days Post-Op  Subjective: Pt c/o some LLQ over his renal transplant site.  He has had a CT and and u/s which were all essentially negative.  There was some slight edema noted on his u/s but no fluid collection seen.  We have been asked to evaluate to rule out an abscess.  Objective: Vital signs in last 24 hours: Temp:  [97.7 F (36.5 C)-98.3 F (36.8 C)] 97.8 F (36.6 C) (05/13 1205) Pulse Rate:  [81-92] 85  (05/13 1205) Resp:  [18-20] 18  (05/13 1205) BP: (123-150)/(55-70) 123/55 mmHg (05/13 1205) SpO2:  [90 %-100 %] 98 % (05/13 1205) Weight:  [251 lb 1.7 oz (113.9 kg)] 251 lb 1.7 oz (113.9 kg) (05/12 2048) Last BM Date: 09/04/11  Intake/Output from previous day: 05/12 0701 - 05/13 0700 In: 954 [P.O.:954] Out: 3 [Stool:3] Intake/Output this shift:    PE: Abd: soft, mildly tender in LLQ, +BS, no erythema or fluctuance noted.  No evidence of abscess noted.  Lab Results:   Basename 09/03/11 1700  WBC 7.3  HGB 9.0*  HCT 30.1*  PLT 139*   BMET  Basename 09/03/11 1700  NA 131*  K 4.7  CL 95*  CO2 27  GLUCOSE 217*  BUN 13  CREATININE 3.57*  CALCIUM 8.9   PT/INR No results found for this basename: LABPROT:2,INR:2 in the last 72 hours CMP     Component Value Date/Time   NA 131* 09/03/2011 1700   K 4.7 09/03/2011 1700   CL 95* 09/03/2011 1700   CO2 27 09/03/2011 1700   GLUCOSE 217* 09/03/2011 1700   BUN 13 09/03/2011 1700   CREATININE 3.57* 09/03/2011 1700   CALCIUM 8.9 09/03/2011 1700   PROT 7.3 08/28/2011 0630   ALBUMIN 2.0* 09/01/2011 0815   AST 15 08/28/2011 0630   ALT 8 08/28/2011 0630   ALKPHOS 207* 08/28/2011 0630   BILITOT 0.6 08/28/2011 0630   GFRNONAA 19* 09/03/2011 1700   GFRAA 22* 09/03/2011 1700   Lipase     Component Value Date/Time   LIPASE 26 08/27/2011 0820       Studies/Results: US Abdomen Limited  09/04/2011  *RADIOLOGY REPORT*  Clinical Data: Left flank pain at transplant  site.  LIMITED ABDOMINAL ULTRASOUND  Comparison:  09/02/2011.  Findings: The transplant kidney measures 11.4 cm.  Normal color flow to the transplant kidney.  No perinephric fluid collections. The urinary bladder appears within normal limits.  Subcutaneous edema is present in the soft tissues overlying the transplant site.  IMPRESSION:  1.  Soft tissue edema without abscess or focal fluid collection overlying the transplant site. 2.  Normal appearance of the left iliac fossa renal allograft.  Original Report Authenticated By: Dereck Ligas, M.D.    Anti-infectives: Anti-infectives     Start     Dose/Rate Route Frequency Ordered Stop   09/02/11 1800  piperacillin-tazobactam (ZOSYN) IVPB 2.25 g       2.25 g 100 mL/hr over 30 Minutes Intravenous Every 8 hours 09/02/11 1612     09/02/11 1600   piperacillin-tazobactam (ZOSYN) IVPB 2.25 g  Status:  Discontinued        2.25 g 100 mL/hr over 30 Minutes Intravenous Every 8 hours 09/02/11 1517 09/02/11 1604   08/27/11 2200   piperacillin-tazobactam (ZOSYN) IVPB 2.25 g  Status:  Discontinued        2.25 g 100 mL/hr over 30 Minutes Intravenous 3 times  per day 08/27/11 1542 08/29/11 1554   08/27/11 1400  piperacillin-tazobactam (ZOSYN) IVPB 2.25 g       2.25 g 100 mL/hr over 30 Minutes Intravenous  Once 08/27/11 1330 08/27/11 1506           Assessment/Plan  1. LLQ abdominal pain, no evidence of abdominal wall abscess.  Plan: 1. No surgical intervention needed.  No evidence clinically or diagnostically of abdominal wall abscess.  Agree with abx therapy.  Defer further care to primary team.   LOS: 9 days    Jennie Bolar E 09/05/2011

## 2011-09-05 NOTE — Progress Notes (Signed)
Pt refused cpap at this time, no distress note, no machine in room.

## 2011-09-05 NOTE — Progress Notes (Signed)
No evidence clinically or radiologically of an abdominal wall abscess.  Thanks  Kathryne Eriksson. Dahlia Bailiff, MD, [place] [phone] [facility]

## 2011-09-05 NOTE — Progress Notes (Signed)
CBG is 45. States he feels lightheaded, weak, and hungry. Gave snack. Will continue to monitor.

## 2011-09-05 NOTE — Progress Notes (Signed)
Subjective:  Area of renal transplant at LLQ very tender; otherwise, no complaints.  Objective: Vital signs in last 24 hours: Temp:  [97.7 F (36.5 C)-98.3 F (36.8 C)] 97.7 F (36.5 C) (05/13 0512) Pulse Rate:  [81-92] 85  (05/13 0512) Resp:  [18-20] 18  (05/13 0512) BP: (125-150)/(57-70) 147/64 mmHg (05/13 0512) SpO2:  [90 %-100 %] 95 % (05/13 0512) Weight:  [113.9 kg (251 lb 1.7 oz)] 113.9 kg (251 lb 1.7 oz) (05/12 2048) Weight change: -4.8 kg (-10 lb 9.3 oz)  Intake/Output from previous day: 05/12 0701 - 05/13 0700 In: 954 [P.O.:954] Out: 3 [Stool:3]   EXAM: General appearance:  Alert, in no apparent distress Resp:  Decreased breath sounds, but clear Cardio:  RRR without murmur GI:  Obese, soft with tenderness over transplant site at LLQ Extremities:  Trace edema  Access:  Right IJ catheter, AVF @ RUA (placed 4/5) with + bruit  Lab Results:  Basename 09/03/11 1700 09/02/11 1135  WBC 7.3 7.0  HGB 9.0* 9.0*  HCT 30.1* 30.6*  PLT 139* 173   BMET:  Basename 09/03/11 1700  NA 131*  K 4.7  CL 95*  CO2 27  GLUCOSE 217*  BUN 13  CREATININE 3.57*  CALCIUM 8.9  ALBUMIN --   No results found for this basename: PTH:2 in the last 72 hours Iron Studies: No results found for this basename: IRON,TIBC,TRANSFERRIN,FERRITIN in the last 72 hours  Assessment/Plan: 1. LLQ pain - specifically over transplant site, abdominal US yesterday with soft tissue edema without abscess or focal fluid collection overlying transplant site; currently no indication for nephrectomy per transplant surgeon at [facility]; afebrile on Zosyn.  Surgery reconsulted. Skin over L lower abdomen is erythematous, warm and tender today. May have a cellulitis. Have not seen transplant rejection present as cellulitis, and on imaging studies of the transplant kidney there has been no sign at all of any significant inflammation, which makes transplant rejection unlikely. He did have significant pyuria though, with  negative culture, and close clinical observation of the Tx kidney is still warranted.  2. ESRD - HD on TTS at [facility], last K 4.7.  HD pending tomorrow. 3. HTN/Volume - BP most recently 147/64, on Labetalol 100 mg qhs; wt today 113.9 with EDW 115 kg. 4. Anemia - Hgb 9 on Aranesp 100 mcg on Thurs and weekly Fe. 5. Metabolic bone disease - Ca 8.9, P 2.8, on Zemplar 2 mcg per HD, Phoslo 1 with meals. 6. DM - on SSI, although uses insulin pump as outpatient. 7. Pericardial effusion - repeat echo indicated moderate pericardial effusion with no signs of tamponade.  Followed by Cardiology. 8. S/p renal transplant - on Cyclosporine and Prednisone.    LOS: 9 days   Dale [name],Dale [name] 09/05/2011,10:32 AM  Patient seen and examined and agree with assessment and plan as above with additions in bold.  Kelly Splinter  MD [place] [phone] [phone]    [phone] 09/05/2011, 4:56 PM

## 2011-09-06 ENCOUNTER — Inpatient Hospital Stay (HOSPITAL_COMMUNITY): Payer: Medicare Other

## 2011-09-06 DIAGNOSIS — N186 End stage renal disease: Secondary | ICD-10-CM

## 2011-09-06 DIAGNOSIS — D631 Anemia in chronic kidney disease: Secondary | ICD-10-CM

## 2011-09-06 DIAGNOSIS — N039 Chronic nephritic syndrome with unspecified morphologic changes: Secondary | ICD-10-CM

## 2011-09-06 DIAGNOSIS — I318 Other specified diseases of pericardium: Secondary | ICD-10-CM

## 2011-09-06 DIAGNOSIS — K810 Acute cholecystitis: Secondary | ICD-10-CM

## 2011-09-06 LAB — RENAL FUNCTION PANEL
Albumin: 1.9 g/dL — ABNORMAL LOW (ref 3.5–5.2)
BUN: 16 mg/dL (ref 6–23)
CO2: 26 mEq/L (ref 19–32)
Calcium: 8.8 mg/dL (ref 8.4–10.5)
Chloride: 96 mEq/L (ref 96–112)
Creatinine, Ser: 4.98 mg/dL — ABNORMAL HIGH (ref 0.50–1.35)
GFR calc Af Amer: 15 mL/min — ABNORMAL LOW (ref >90)
GFR calc non Af Amer: 13 mL/min — ABNORMAL LOW (ref >90)
Glucose, Bld: 266 mg/dL — ABNORMAL HIGH (ref 70–99)
Phosphorus: 4.5 mg/dL (ref 2.3–4.6)
Potassium: 3.9 mEq/L (ref 3.5–5.1)
Sodium: 132 mEq/L — ABNORMAL LOW (ref 135–145)

## 2011-09-06 LAB — CBC
HCT: 28.6 % — ABNORMAL LOW (ref 39.0–52.0)
Hemoglobin: 8.6 g/dL — ABNORMAL LOW (ref 13.0–17.0)
MCH: 27.8 pg (ref 26.0–34.0)
MCHC: 30.1 g/dL (ref 30.0–36.0)
MCV: 92.6 fL (ref 78.0–100.0)
Platelets: 123 10*3/uL — ABNORMAL LOW (ref 150–400)
RBC: 3.09 MIL/uL — ABNORMAL LOW (ref 4.22–5.81)
RDW: 20.6 % — ABNORMAL HIGH (ref 11.5–15.5)
WBC: 7 10*3/uL (ref 4.0–10.5)

## 2011-09-06 LAB — GLUCOSE, CAPILLARY
Glucose-Capillary: 281 mg/dL — ABNORMAL HIGH (ref 70–99)
Glucose-Capillary: 95 mg/dL (ref 70–99)

## 2011-09-06 MED ORDER — LABETALOL HCL 100 MG PO TABS: 100.0000 mg | ORAL_TABLET | Freq: Every day | ORAL | Status: DC

## 2011-09-06 MED ORDER — DIPHENHYDRAMINE HCL 50 MG/ML IJ SOLN
INTRAMUSCULAR | Status: AC
  Filled 2011-09-06: qty 1

## 2011-09-06 MED ORDER — INSULIN GLARGINE 100 UNIT/ML ~~LOC~~ SOLN
20.0000 [IU] | Freq: Every day | SUBCUTANEOUS | Status: DC
  Administered 2011-09-06: 20 [IU] via SUBCUTANEOUS

## 2011-09-06 MED ORDER — PARICALCITOL 5 MCG/ML IV SOLN
INTRAVENOUS | Status: AC
  Administered 2011-09-06: 2 ug
  Filled 2011-09-06: qty 1

## 2011-09-06 MED ORDER — DARBEPOETIN ALFA-POLYSORBATE 100 MCG/0.5ML IJ SOLN: 100.0000 ug | INTRAMUSCULAR | Status: DC

## 2011-09-06 MED ORDER — HYDROMORPHONE HCL PF 1 MG/ML IJ SOLN
INTRAMUSCULAR | Status: AC
  Filled 2011-09-06: qty 1

## 2011-09-06 MED ORDER — DARBEPOETIN ALFA-POLYSORBATE 100 MCG/0.5ML IJ SOLN
INTRAMUSCULAR | Status: AC
  Administered 2011-09-06: 100 ug
  Filled 2011-09-06: qty 0.5

## 2011-09-06 MED ORDER — DOXYCYCLINE HYCLATE 100 MG PO TABS
100.0000 mg | ORAL_TABLET | Freq: Two times a day (BID) | ORAL | Status: DC
  Administered 2011-09-06: 100 mg via ORAL
  Filled 2011-09-06 (×2): qty 1

## 2011-09-06 MED ORDER — INSULIN ASPART 100 UNIT/ML ~~LOC~~ SOLN: 4.0000 [IU] | Freq: Three times a day (TID) | SUBCUTANEOUS | Status: DC

## 2011-09-06 MED ORDER — NITROGLYCERIN 0.4 MG SL SUBL: 0.4000 mg | SUBLINGUAL_TABLET | SUBLINGUAL | Status: DC | PRN

## 2011-09-06 MED ORDER — DOXYCYCLINE HYCLATE 100 MG PO TABS: 100.0000 mg | ORAL_TABLET | Freq: Two times a day (BID) | ORAL | Status: DC

## 2011-09-06 NOTE — Discharge Summary (Signed)
[name], 46 y.o., is a 46 y.o. male  DOB [birth year]  MRN [id number].  Admission date:  08/27/2011  Discharge Date:  09/06/2011  Primary MD  Windy Kalata, MD, MD  Admitting Physician  Silvestre Moment, MD  Admission Diagnosis  Cholecystitis [575.10] Renal transplant disorder [996.81] Dialysis patient [V45.11] Cholecystitis lower abd. pain, r/o ischemic bowl anemia  Discharge Diagnosis     Active Problems:  History of renal transplant  S/P BKA (below knee amputation)  Obstructive sleep apnea  CHF (congestive heart failure)  Anemia  Diabetes mellitus  ESRD (end stage renal disease)  Abdominal pain  Pericardial effusion  Colitis, ischemic     Past Medical History  Diagnosis Date  . Insulin dependent diabetes mellitus   . Hypertension   . History of blood transfusion   . ESRD (end stage renal disease)     a. 1995 s/p cadaveric transplant w/ susbequent failure after 18 yrs;  b.Dialysis initiated 07/2011  . Dyspnea     a. 07/23/11 Echo: EF 55-60%  . Anemia of chronic disease   . Cholecystitis     a. 08/27/2011  . Constipation   . Pericardial effusion     a.  Small by CT 08/22/11;  b.  Large by CT 08/27/11  . Inguinal lymphadenopathy     a. bilateral - s/p biopsy 07/2011    Past Surgical History  Procedure Date  . Appendectomy   . Hernia repair   . Kidney transplant   . Cataract extraction   . Arteriovenous graft placement   . Av fistula placement   . Toe amputation   . Below knee leg amputation   . Carpal tunnel release   . Shoulder surgery   . Av fistula placement 07/29/2011    Procedure: ARTERIOVENOUS (AV) FISTULA CREATION;  Surgeon: Mal Misty, MD;  Location: [place];  Service: Vascular;  Laterality: Right;  Brachial cephalic  . Insertion of dialysis catheter 08/01/2011    Procedure: INSERTION OF  DIALYSIS CATHETER;  Surgeon: Rosetta Posner, MD;  Location: [place];  Service: Vascular;  Laterality: Right;  insertion of dialysis catheter on right internal jugular vein  . Lymph node biopsy 08/10/2011    Procedure: LYMPH NODE BIOPSY;  Surgeon: Merrie Roof, MD;  Location: [place];  Service: General;  Laterality: Left;  left groin lymph biopsy  . Colonoscopy 08/29/2011    Procedure: COLONOSCOPY;  Surgeon: Jerene Bears, MD;  Location: [place];  Service: Gastroenterology;  Laterality: N/A;     Hospital Course See H&P, Labs, Consult and Test reports for all details in brief -  Dale [name] is a 46 year old gentleman with history of diabetes mellitus insulin-dependent, left BKA, renal failure status post renal transplant at [facility] in [facility], patient has long since had transplant failure requiring dialysis on a routine basis, he has been admitted several times to the hospital for made his medical problems. He was discharged from this hospital about 12 days ago following which he  presented again to ER with some abdominal pain, usually there was suspicion of acute cholecystitis however it was ruled out with HIDA scan. Subsequent imaging studies and colonoscopy indicated that patient had ischemic colitis due to hypotensive episodes during dialysis. His blood pressure medications have been titrated with good effect blood pressure much stable now.  Patient was seen by GI and general surgery, agents blood pressure medications were titrated by nephrologist with good results his blood pressures have improved and his ischemic colitis seems to be in good control.   Should about 2-3 days ago around 09/02/2011 started complaining of left lower quadrant pain around his transplant kidney site, CT scan and ultrasound did not show signs of any acute infection his UA has not grown any bugs yet, case was discussed with [facility] Dr. Linward Foster, it was decided that per his imaging studies there is some swelling and soft tissue  edema in the left lower quadrant but none around the transplant kidney. I had reconsulted surgery on 09/05/2011 to evaluate if there is any soft tissue abscess, surgery saw the patient and concurred that there was no fluid collection or abscess, patient continues to be afebrile with normal white blood cell count will stop IV Zosyn and put him on by mouth doxycycline. Clinically he feels better with less pain and tenderness at the site. He will follow with his primary care physician in 3-4 days.   For his ESRD patient will continue his Tuesday Thursday Saturday dialysis as outpatient, continue his prednisone and cyclosporin at home dose.  For his type 2 diabetes mellitus patient will go back on his insulin pump as before.   History of hypertension with episodes of low blood pressure during dialysis, blood pressure medications have been adjusted and blood pressure appears to be stable on these medications at this time.  History of sleep apnea patient continues to be noncompliant with his CPAP, have arranged for home oxygen which he can use at nighttime also.  History of hypothyroidism- Continue Synthroid, elevated TSH but normal free T4 level will keep on current dose of synthroid . Outpatient TSH monitoring.    ACUTE DIASTOLIC CHF (congestive heart failure)/pericardial effusion - currently resolved after fluid removal from dialysis, patient's volume status is very fragile and goes into pulmonary vascular congestion with slight stress. For His pericardial effusion patient has been seen by cardiology and has had 2 echoes, no further interventions per cardiology for pericardial effusion. Will follow with cardiology as outpatient for his CHF and pericardial effusion.   He still intermittently requires oxygen, on room air during his off dialysis days his pulse ox drops to mid 80s, prior to dialysis today his pulse ox was 88% on room air, after 2 L nasal cannula oxygen per minute it came up to 95%. He  will be prescribed 2 L nasal cannula oxygen per minute at home.   Patient will get home health PT and RN upon discharge he does not wish to go to a facility he wants to go home tomorrow morning.   Consults  GI, general surgery, Renal  Significant Tests:  See full reports for all details     Ct Abdomen Pelvis Wo Contrast  08/27/2011  *RADIOLOGY REPORT*  Clinical Data: Worsening bilateral lower quadrant abdominal pain. Surgical history includes recent left superficial inguinal lymph node resection, appendectomy, renal transplant, and hernia repair.  CT ABDOMEN AND PELVIS WITHOUT CONTRAST 08/27/2011:  Technique:  Multidetector CT imaging of the abdomen and pelvis was performed following the standard  protocol without intravenous contrast.  Comparison: Unenhanced CT abdomen and pelvis 08/22/2011, 08/04/2011.  Findings: Interval slight increase in size of the fluid collection in the left superficial inguinal region (overlying the common femoral artery and vein, not within the left inguinal canal), current measurements approximating 3.9 x 3.9 cm (series 2, image 97), previously 4.1 x 3.3 cm.  The fluid collection has a Hounsfield measurement of 6, indicating simple fluid.  On the prior examination, gas bubbles were present within the collection, and this is in the area of the recent lymph node resection.  Interval development of thickening of the wall of the gallbladder, with edema/inflammation surrounding the gallbladder.  No biliary ductal dilation.  Normal low dose unenhanced appearance of the liver, spleen, and adrenal glands.  Stable mild pancreatic atrophy. Atrophic native kidneys consistent with end-stage renal disease, without focal parenchymal abnormality.  Moderate to severe aorto- iliofemoral and visceral artery atherosclerosis without aneurysm.  Lymphadenopathy in the retroperitoneum of the abdomen and both sides of the pelvis, unchanged.  Bilateral superficial inguinal lymphadenopathy, unchanged.  No  new or enlarging lymphadenopathy. Index left retroperitoneal node approximately 3-4 cm above the aortic bifurcation measures approximately 2.3 x 1.7 cm (series 2, image 46).  Index right retrocrural lymph node is stable at 1.1 cm (series 2, image 19).  Stomach and small bowel normal in appearance.  Distal descending and sigmoid colon diverticulosis without evidence of acute diverticulitis.  Remainder of the colon unremarkable.  Apparent wall thickening in the cecum is felt to be related to decompression.  Lipoma again noted involving the ileocecal valve. No ascites.  Satisfactory appearance of the renal transplant in the left side of the pelvis, without evidence of hydronephrosis or focal parenchymal abnormality.  Urinary bladder unremarkable.  Prostate gland and seminal vesicles normal.  No evidence of recurrent right inguinal hernia.  Diffuse body wall edema.  Bone window images demonstrate degenerative changes involving the lower thoracic and upper lumbar spine.  Interval increase in size of the now large pericardial effusion. Interval increase in size of a small bilateral pleural effusions. Associated mild passive atelectasis in the lower lobes.  IMPRESSION:  1.  Interval slight increase in size of the postoperative seroma or liquefied hematoma in the left superficial inguinal region at the site of prior lymph node resection. 2.  Stable retroperitoneal and bilateral superficial inguinal lymphadenopathy. 3.  CT findings consistent with acute cholecystitis, new. 4.  Interval increase in size of a now large pericardial effusion. 5.  Interval increase in size of small bilateral pleural effusions. Associated passive atelectasis in the lower lobes, left greater than right. 6.  Distal descending and sigmoid colon diverticulosis without evidence of acute diverticulitis. 7.  Satisfactory unenhanced appearance of the renal transplant in the left side of the pelvis.  These results were called by telephone on 08/27/2011  at   1210 hours to  Dr. Maryan Rued of the emergency department, who verbally acknowledged these results.  Original Report Authenticated By: Deniece Portela, M.D.   Ct Abdomen Pelvis Wo Contrast  08/22/2011  *RADIOLOGY REPORT*  Clinical Data: Abdominal distention, constipation  CT ABDOMEN AND PELVIS WITHOUT CONTRAST  Technique:  Multidetector CT imaging of the abdomen and pelvis was performed following the standard protocol without intravenous contrast.  Comparison: 08/06/2011  Findings: There is small pericardial effusion measures 7 mm maximum thickness.  Atherosclerotic calcifications of the coronary arteries. Small left pleural effusion with left base posterior atelectasis.  NG tube with tip coiled within stomach.  Sagittal images of  the spine shows degenerative changes lower thoracic spine.  The study is limited without IV contrast.  The unenhanced liver is unremarkable. Unenhanced pancreas, spleen and adrenals are unremarkable. Again noted renal atrophy and chronic renal cortical thinning. Vascular calcifications bilateral kidney are stable.  Again noted multiple enlarged retroperitoneal and mesenteric lymph nodes.  A left periaortic lymph node in axial image 37 measures 2 x 1.6 cm.  Moderate stool noted in the right colon.  Small amount of layering sludge  or  tiny gallstones are suspected within dependent gallbladder.  Atherosclerotic calcifications are again noted SMA, splenic artery bilateral renal artery origin.  Again noted a transplanted kidney in the left iliac fossa stable in appearance without evidence of hydronephrosis or hydroureter.  Mild distended urinary bladder.  Oral contrast material was given to the patient.  No small bowel or colonic obstruction.  Enlarged lymph nodes bilateral inguinal region are again noted.  The largest right inguinal lymph node measures 1.7 x 1.9 cm.  The largest left inguinal lymph node measures 1.7 x 1.5 cm.  There is a fluid collection in the left inguinal region  measures 12 HU in attenuation.  This measures 4 x 3.3 cm.  May represent a resolving seroma or hematoma.  There is thickening of the skin anterior abdominal wall and mild subcutaneous anasarca infiltration of the subcutaneous fat.  A small midline lower ventral hernia containing fat measures 3.5 cm only partially visualized.  Postsurgical changes are noted right inguinal region.  No destructive bony lesions are noted within pelvis.  IMPRESSION:  1.  There is small pericardial effusion.  Small left pleural effusion with left base posteriorly atelectasis. 2.  Again noted atrophic native kidneys.  Stable transplanted kidney in the left iliac fossa.  No hydronephrosis or hydroureter. 3.  Again noted retroperitoneal and mesenteric enlarged lymph nodes.  Bilateral enlarged inguinal lymph nodes again noted. 4.  There is a small fluid collection in the left inguinal region measures 4 x 3.3 cm.  This may represent a seroma or resolving hematoma.  Less likely inguinal abscess. 5.  Moderate stool noted in the right colon.  No small bowel or colonic obstruction.  Original Report Authenticated By: Lahoma Crocker, M.D.   Dg Chest 2 View  09/01/2011  *RADIOLOGY REPORT*  Clinical Data: Shortness of breath, chest tightness.  CHEST - 2 VIEW  Comparison: 08/31/2011  Findings: Cardiomegaly.  Mild vascular congestion.  Left base atelectasis with small left effusion again noted, unchanged.  No confluent opacity on the right. Right dialysis catheter is unchanged.  IMPRESSION: No significant change.  Original Report Authenticated By: Raelyn Number, M.D.   Nm Hepatobiliary Liver Func  08/28/2011  *RADIOLOGY REPORT*  Clinical data: Abdominal pain.  Gallbladder wall thickening on ultrasound.  HEPATOBILIARY SCINTIGRAPHY WITH EJECTION FRACTION  Anterior imaging after5.57mCi Tc99M Choletec IV. There is prompt clearance of the radiopharmaceutical from the blood pool. Timely visualization of activity in central bile ducts, small bowel, and  gallbladder. After 1 hour,2.3 mcg CCK was infused intravenously and imaging continued. The patient describes no pain with infusion. The calculated gallbladder ejection fraction over 30 minutes is 86% (normal >30% at  thirty minutes [facility] et al., 1992 J. Nuclear Med).  IMPRESSION 1. Patency of cystic and common bile ducts. 2. Normal gallbladder ejection fraction.  Original Report Authenticated By: Trecia Rogers, M.D.   US Abdomen Complete  08/27/2011  *RADIOLOGY REPORT*  Clinical Data:  Right upper quadrant pain, nausea; history of kidney transplant  ABDOMINAL  ULTRASOUND COMPLETE  Comparison:  None.  Findings:  Gallbladder:   No gallstones are identified at but there is gallbladder wall thickening and pericholecystic fluid.  Common Bile Duct:  Within normal limits in caliber. Measures 3 mm.  Liver: No focal mass lesion identified.  Within normal limits in parenchymal echogenicity.  IVC:  Appears normal.  Pancreas:  No abnormality identified.  Spleen:  Within normal limits in size and echotexture.  Right kidney:  Atrophic. No evidence of mass or hydronephrosis.  Left kidney: Atrophic.  No evidence of mass or hydronephrosis.  The left lower quadrant transplanted kidney is not imaged.  Abdominal Aorta:  No aneurysm identified.  IMPRESSION: Acalculous cholecystitis with gallbladder wall thickening and pericholecystic fluid.  Original Report Authenticated By: Duayne Cal, M.D.   Ct Abdomen Pelvis W Contrast  09/02/2011  *RADIOLOGY REPORT*  Clinical Data: Left-sided abdominal pain.  History of renal transplant.  CT ABDOMEN AND PELVIS WITH CONTRAST  Technique:  Multidetector CT imaging of the abdomen and pelvis was performed following the standard protocol during bolus administration of intravenous contrast.  Contrast: 136mL OMNIPAQUE IOHEXOL 300 MG/ML  SOLN  Comparison: CT scan 08/27/2011.  Findings: Examination is limited by a body habitus.  The patient is touching the gantry which is creating  significant artifact.  The lung bases demonstrates small bilateral pleural effusions and bibasilar atelectasis.  A stable small to moderate-sized pericardial effusion is noted.  The liver is unremarkable and stable.  No focal lesions or biliary dilatation.  The gallbladder is mildly contracted.  I do not see any inflammation was present on the prior CT scan.  The pancreas is unremarkable and stable.  Mild pancreatic atrophy for age.  The spleen is normal in size.  No focal lesions.  The adrenal glands are normal and stable.  The kidneys are very small and demonstrate significant atrophy.  Renal artery calcifications are noted.  The stomach, duodenum, small bowel and colon are grossly normal. No inflammatory changes or mass lesions.  No obstruction.  Stable enlarged mesenteric and retroperitoneal lymph nodes.  The aorta is normal in caliber.  Stable atherosclerotic changes.  The transplanted kidney in the left pelvis appears normal.  Normal perfusion.  No obstruction.  The bladder is normal.  The seminal vesicles and prostate gland are unremarkable.  Vas deferens calcifications are noted.  No pelvic mass, adenopathy or free pelvic fluid collections.  Stable borderline enlarged pelvic lymph nodes.  Stable enlarged inguinal lymph nodes.  There is a persistent left inguinal/groin fluid collection which is likely a liquefied hematoma.  IMPRESSION:  1.  Persistent small bilateral pleural effusions, left greater than right with overlying atelectasis. 2.  Persistent small to moderate-sized pericardial effusion. 3.  Stable mesenteric, retroperitoneal, pelvic and inguinal adenopathy. 4.  No acute intra abdominal findings. 5.  Persistent fluid collection in the left inguinal/groin area.  Original Report Authenticated By: P. Kalman Jewels, M.D.   US Abdomen Limited  09/04/2011  *RADIOLOGY REPORT*  Clinical Data: Left flank pain at transplant site.  LIMITED ABDOMINAL ULTRASOUND  Comparison:  09/02/2011.  Findings: The  transplant kidney measures 11.4 cm.  Normal color flow to the transplant kidney.  No perinephric fluid collections. The urinary bladder appears within normal limits.  Subcutaneous edema is present in the soft tissues overlying the transplant site.  IMPRESSION:  1.  Soft tissue edema without abscess or focal fluid collection overlying the transplant site. 2.  Normal appearance of the left iliac fossa renal allograft.  Original Report  Authenticated By: Dereck Ligas, M.D.   Dg Chest Port 1 View  08/31/2011  *RADIOLOGY REPORT*  Clinical Data: Shortness of breath and chest pressure.  Dialysis.  PORTABLE CHEST - 1 VIEW  Comparison: 08/23/2011  Findings: Marked cardiac enlargement.  Mild vascular congestion without overt failure or focal infiltrates.  New left base process, likely pleural effusion, with left lower lobe retrocardiac subsegmental atelectasis, not appreciated on priors.  Dialysis catheter good position and unchanged from priors.  No pneumothorax.  IMPRESSION: Marked cardiac enlargement with slight vascular congestion. New left pleural effusion with left base   subsegmental atelectasis.  Original Report Authenticated By: Staci Righter, M.D.   Dg Chest Portable 1 View  08/23/2011  *RADIOLOGY REPORT*  Clinical Data: Shortness of breath  PORTABLE CHEST - 1 VIEW  Comparison: 08/03/2011  Findings: Cardiomegaly.  Central vascular congestion. Mild opacity at the left lung base.  Otherwise, no focal consolidation.  No overt edema.  No pleural effusion or pneumothorax.  Large bore right subclavian catheter tip projects over the cavoatrial junction.  No acute osseous abnormality identified.  IMPRESSION: Cardiomegaly.  Vascular congestion without overt edema.  Mild left lung base opacity; atelectasis versus infiltrate.  Original Report Authenticated By: Suanne Marker, M.D.   Dg Abd Acute W/chest  08/27/2011  *RADIOLOGY REPORT*  Clinical Data: Abdominal pain and shortness of breath.  ACUTE ABDOMEN SERIES  (ABDOMEN 2 VIEW & CHEST 1 VIEW)  Comparison: Chest x-ray fourth 32,013.  Findings: The upright chest x-ray demonstrates stable cardiac enlargement.  The right IJ diatek catheter is stable.  There is vascular congestion and mild pulmonary edema with small pleural effusions.  Two views of the abdomen demonstrate an unremarkable bowel gas pattern.  There is scattered air and stool in the colon.  No distended small bowel loops to suggest obstruction.  No free air. The bony structures are intact.  IMPRESSION:  1.  Cardiac enlargement with vascular congestion, mild interstitial edema and small effusions. 2.  No plain film findings for an acute abdominal process.  Original Report Authenticated By: P. Kalman Jewels, M.D.   Dg Abd Acute W/chest  08/22/2011  *RADIOLOGY REPORT*  Clinical Data: Abdominal pain and constipation.  ACUTE ABDOMEN SERIES (ABDOMEN 2 VIEW & CHEST 1 VIEW)  Comparison: Chest x-ray 08/03/2011.  Abdominal x-ray 04/06/2008.  Findings: There is a right internal jugular PermCath with the tips in the right atrium.  Lung volumes are normal.  Minimal dependent bibasilar opacities are favored to reflect areas of subsegmental atelectasis.  No definite pleural effusions.  Mild pulmonary venous congestion without frank pulmonary edema.  Heart size is mildly enlarged (unchanged).  Mediastinal contours are unremarkable. Atherosclerotic calcifications within the arch of the aorta.  No pneumoperitoneum  Supine and upright views of the abdomen demonstrate gas and stool scattered throughout the colon extending to the level of the distal rectum.  There are some borderline and mildly dilated loops of gas- filled small bowel (up to 4.2 cm in diameter) throughout the central and upper abdomen.  No definite air-fluid levels are noted on the upright projection.  IMPRESSION: 1.  Nonspecific bowel gas pattern, as above.  Findings may simply reflect an ileus, however, clinical correlation for signs and symptoms of early or  partial small bowel obstruction is recommended. 2.  No pneumoperitoneum. 3.  No radiographic evidence of acute cardiopulmonary disease. 4.  Mild cardiomegaly is unchanged. 5.  Support apparatus, as above.  Original Report Authenticated By: Etheleen Mayhew, M.D.  Today   Subjective:   Dale [name] today has no headache,no chest abdominal pain,no new weakness tingling or numbness, feels much better wants to go home tomorrow as wife is busy today.  Objective:   Blood pressure 107/53, pulse 80, temperature 98.9 F (37.2 C), temperature source Oral, resp. rate 18, height 5\' 8"  (1.727 m), weight 115.1 kg (253 lb 12 oz), SpO2 100.00%.  Intake/Output Summary (Last 24 hours) at 09/06/11 1054 Last data filed at 09/06/11 0700  Gross per 24 hour  Intake    240 ml  Output      5 ml  Net    235 ml    Exam Awake Alert, Oriented *3, No new F.N deficits, Normal affect [place].AT,PERRAL Supple Neck,No JVD, No cervical lymphadenopathy appriciated.  Symmetrical Chest wall movement, Good air movement bilaterally, CTAB RRR,No Gallops,Rubs or new Murmurs, No Parasternal Heave +ve B.Sounds, Abd Soft, Non tender, No organomegaly appriciated, No rebound -guarding or rigidity,left renal transplant site is little tender and warm but improved from before. No Cyanosis, Clubbing or edema, No new Rash or bruise  Data Review     CBC w Diff: Lab Results  Component Value Date   WBC 7.3 09/03/2011   HGB 9.0* 09/03/2011   HCT 30.1* 09/03/2011   PLT 139* 09/03/2011   LYMPHOPCT 27 09/02/2011   MONOPCT 11 09/02/2011   EOSPCT 9* 09/02/2011   BASOPCT 1 09/02/2011   CMP: Lab Results  Component Value Date   NA 132* 09/06/2011   K 3.9 09/06/2011   CL 96 09/06/2011   CO2 26 09/06/2011   BUN 16 09/06/2011   CREATININE 4.98* 09/06/2011   PROT 7.3 08/28/2011   ALBUMIN 1.9* 09/06/2011   BILITOT 0.6 08/28/2011   ALKPHOS 207* 08/28/2011   AST 15 08/28/2011   ALT 8 08/28/2011  .  Micro Results Recent Results (from the past 240  hour(s))  URINE CULTURE     Status: Normal   Collection Time   09/02/11 11:39 AM      Component Value Range Status Comment   Specimen Description URINE, CLEAN CATCH   Final    Special Requests NONE   Final    Culture  Setup Time [id number]   Final    Colony Count NO GROWTH   Final    Culture NO GROWTH   Final    Report Status 09/03/2011 FINAL   Final      Discharge Instructions     Follow with Primary MD Windy Kalata, MD, MD in 3 days   Get CBC, CMP, checked 3 days by Primary MD and again as instructed by your Primary MD.    Get Medicines reviewed and adjusted.  Please request your Prim.MD to go over all Hospital Tests and Procedure/Radiological results at the follow up, please get all Hospital records sent to your Prim MD by signing hospital release before you go home.  Activity: As tolerated with Full fall precautions use walker/cane & assistance as needed  Diet: Renal-Low Salt,  Fluid restriction 1.5 lit/day, Aspiration precautions.  Check your Weight same time everyday, if you gain over 2 pounds, or you develop in leg swelling, experience more shortness of breath or chest pain, call your Primary MD immediately. Follow Cardiac Low Salt Diet and 1.5 lit/day fluid restriction.  Disposition Home  If you experience worsening of your admission symptoms, develop shortness of breath, life threatening emergency, suicidal or homicidal thoughts you must seek medical attention immediately by calling 911 or calling your MD immediately  if symptoms less severe.  You Must read complete instructions/literature along with all the possible adverse reactions/side effects for all the Medicines you take and that have been prescribed to you. Take any new Medicines after you have completely understood and accpet all the possible adverse reactions/side effects.   Do not drive if your were admitted for syncope or siezures until you have seen by Primary MD or a Neurologist and advised to  drive.  Do not drive when taking Pain medications.    Do not take more than prescribed Pain, Sleep and Anxiety Medications  Special Instructions: If you have smoked or chewed Tobacco  in the last 2 yrs please stop smoking, stop any regular Alcohol  and or any Recreational drug use.  Wear Seat belts while driving. Follow-up Information    Follow up with MATTINGLY,MICHAEL T, MD. Schedule an appointment as soon as possible for a visit in 3 days.   Contact information:   [address] [place] [phone]       Follow up with Jerene Bears, MD. Schedule an appointment as soon as possible for a visit in 1 week.   Contact information:   [address] [place] [phone]       Follow up with Glori Bickers, MD. Schedule an appointment as soon as possible for a visit in 1 week.   Contact information:   [address] [place] [phone]          Discharge Medications   Medication List  As of 09/06/2011 10:54 AM   START taking these medications         nitroGLYCERIN 0.4 MG SL tablet   Commonly known as: NITROSTAT   Place 1 tablet (0.4 mg total) under the tongue every 5 (five) minutes as needed for chest pain.         CHANGE how you take these medications         labetalol 100 MG tablet   Commonly known as: NORMODYNE   Take 1 tablet (100 mg total) by mouth at bedtime.   What changed: - medication strength - dose - how often to take the med         CONTINUE taking these medications         aspirin 81 MG tablet      calcium acetate 667 MG capsule   Commonly known as: PHOSLO   Take 2 capsules (1,334 mg total) by mouth 3 (three) times daily with meals.      cycloSPORINE modified 100 MG capsule   Commonly known as: NEORAL      doxycycline 100 MG tablet   Commonly known as: VIBRA-TABS   Take 1 tablet (100 mg total) by mouth 2 (two) times daily.      insulin pump  100 unit/ml Soln      levothyroxine 75 MCG tablet   Commonly known as: SYNTHROID, LEVOTHROID      metoCLOPramide 5 MG tablet   Commonly known as: REGLAN      predniSONE 5 MG tablet   Commonly known as: DELTASONE      promethazine 25 MG tablet   Commonly known as: PHENERGAN      PROVENTIL HFA 108 (90 BASE) MCG/ACT inhaler   Generic drug: albuterol      ranitidine 75 MG tablet   Commonly known as: ZANTAC      rOPINIRole 1 MG tablet   Commonly known as:  REQUIP         STOP taking these medications         bisacodyl 10 MG suppository      isosorbide mononitrate 60 MG 24 hr tablet      lactulose 20 G packet          Where to get your medications    These are the prescriptions that you need to pick up. We sent them to a specific pharmacy, so you will need to go there to get them.   [id number] - [place], [place] [address]    Phone: [phone]        doxycycline 100 MG tablet   labetalol 100 MG tablet   nitroGLYCERIN 0.4 MG SL tablet             Total Time in preparing paper work, data evaluation and todays exam - 35 minutes  [name] M.D on 09/06/2011 at 10:54 AM  [facility] Office  [phone]

## 2011-09-06 NOTE — Progress Notes (Signed)
Discussed discharge information with pt. Pt showed no barriers to discharge. IV removed. Tele removed. Assessment unchanged from morning. Pt awaiting ride home with wife.

## 2011-09-06 NOTE — Progress Notes (Signed)
Subjective:  Seen on HD, no current complaints, area of renal transplant at [facility] tender to deep palpation.  Objective: Vital signs in last 24 hours: Temp:  [97.2 F (36.2 C)-98.4 F (36.9 C)] 97.8 F (36.6 C) (05/14 0523) Pulse Rate:  [82-85] 82  (05/14 0523) Resp:  [16-18] 16  (05/14 0523) BP: (123-157)/(55-69) 133/61 mmHg (05/14 0523) SpO2:  [94 %-100 %] 94 % (05/14 0523) Weight:  [114 kg (251 lb 5.2 oz)] 114 kg (251 lb 5.2 oz) (05/13 2300) Weight change: 0.1 kg (3.5 oz)  Intake/Output from previous day: 05/13 0701 - 05/14 0700 In: 240 [P.O.:240] Out: 5 [Urine:1; Stool:4]   EXAM: General appearance:  Alert, in no apparent distress Resp:  CTA bilaterally Cardio:  RRR without murmur GI:  Obese, soft with tenderness over transplant site at [facility] with deep palpation Extremities:  Trace edema Access:  Right IJ catheter with BFR 440 cc/min, AVF @ [facility] (placed 4/5) with + bruit   Lab Results:  Basename 09/03/11 1700  WBC 7.3  HGB 9.0*  HCT 30.1*  PLT 139*   BMET:  Basename 09/03/11 1700  NA 131*  K 4.7  CL 95*  CO2 27  GLUCOSE 217*  BUN 13  CREATININE 3.57*  CALCIUM 8.9  ALBUMIN --   No results found for this basename: PTH:2 in the last 72 hours Iron Studies: No results found for this basename: IRON,TIBC,TRANSFERRIN,FERRITIN in the last 72 hours  Assessment/Plan: 1. LLQ pain - specifically over transplant site, abdominal US indicated soft tissue edema without abscessor focal fluid collection overlying transplant site; afebrile on IV Zosyn; transplant rejection unlikely, so no indication for nephrectomy, but close clinical observation is still warranted. 2. ESRD - HD on TTS at [facility], last K 4.7.  3. HTN/Volume - BP 158/79 on labetalol 100 mg qhs; pre-HD wt 115.1 with EDW 115 kg.  UF goal of 3.5 L today, will likely require EDW decrease. 4. Anemia - Hgb 9 on Aranesp 100 mcg on thurs and weekly Fe. 5. Metabolic bone disease - Ca 8.9, P 2.8 on Zemplar 2 mcg per, Phoslo 1 with  meals.   6. DM - on SSI, although uses insulin pump at home. 7. Pericardial effusion - repeat echo indicated moderate pericardial effusion with no signs of tamponade; followed by Cardiology. 8. S/p renal transplant - on Cyclosporine and prednisone.   LOS: 10 days   Dale [name],Dale [name] 09/06/2011,6:55 AM  Kelly Splinter  MD [place] [phone] pgr    [phone] cell 09/06/2011, 4:11 PM

## 2011-09-06 NOTE — Progress Notes (Signed)
Patient Demographics  Dale [name], is a 46 y.o. male  [id number]  [id number]  DOB - 1965-10-26  Admit date - 08/27/2011  Admitting Physician Silvestre Moment, MD  Outpatient Primary MD for the patient is Windy Kalata, MD, MD  LOS - 10     Chief Complaint  Patient presents with  . Shortness of Breath        Subjective:   Dale [name] today has, No headache, No chest pain,  No Nausea, No new weakness tingling or numbness, No Cough - SOB is much better,  he is having pain at his left kidney transplant site.  Objective:   Filed Vitals:   09/06/11 0902 09/06/11 0930 09/06/11 1005 09/06/11 1030  BP: 133/69 138/79 110/57 107/53  Pulse: 86 83 81 80  Temp:      TempSrc:      Resp: 17 19 17 18   Height:      Weight:      SpO2:        Wt Readings from Last 3 Encounters:  09/06/11 115.1 kg (253 lb 12 oz)  09/06/11 115.1 kg (253 lb 12 oz)  09/06/11 115.1 kg (253 lb 12 oz)     Intake/Output Summary (Last 24 hours) at 09/06/11 1041 Last data filed at 09/06/11 0700  Gross per 24 hour  Intake    240 ml  Output      5 ml  Net    235 ml    Exam Awake Alert, Oriented *3, No new F.N deficits, Normal affect [place].AT,PERRAL Supple Neck,No JVD, No cervical lymphadenopathy appriciated.  Symmetrical Chest wall movement, Good air movement bilaterally, 2 bibasilar Rales RRR,No Gallops,Rubs or new Murmurs, No Parasternal Heave +ve B.Sounds, Abd Soft but maybe slightly distended, No organomegaly appriciated, No rebound -guarding or rigidity, and left renal transplant site is little tender and warm No Cyanosis, Clubbing or edema, No new Rash or bruise, left BKA  Data Review  CBC  Lab 09/03/11 1700 09/02/11 1135 09/01/11 0815 08/31/11 1428 08/31/11 0601  WBC 7.3 7.0 6.2 6.9 7.0  HGB 9.0* 9.0* 8.6* 8.8* 8.8*  HCT 30.1* 30.6* 29.2* 29.5* 29.7*  PLT 139* 173 138* 180 185  MCV 93.2 93.0 93.6 91.6 92.2  MCH 27.9 27.4 27.6 27.3 27.3  MCHC 29.9* 29.4* 29.5* 29.8* 29.6*  RDW  20.5* 20.8* 20.8* 20.6* 20.7*  LYMPHSABS -- 1.9 -- -- --  MONOABS -- 0.8 -- -- --  EOSABS -- 0.6 -- -- --  BASOSABS -- 0.1 -- -- --  BANDABS -- -- -- -- --    Chemistries   Lab 09/06/11 0712 09/03/11 1700 09/01/11 0815 08/31/11 1428 08/31/11 0601  NA 132* 131* 135 132* 132*  K 3.9 4.7 3.9 3.8 3.9  CL 96 95* 98 95* 97  CO2 26 27 28 29 28   GLUCOSE 266* 217* 161* 113* 175*  BUN 16 13 9 18 17   CREATININE 4.98* 3.57* 2.41* 3.20* 3.00*  CALCIUM 8.8 8.9 8.5 8.8 8.6  MG -- -- -- -- --  AST -- -- -- -- --  ALT -- -- -- -- --  ALKPHOS -- -- -- -- --  BILITOT -- -- -- -- --   ------------------------------------------------------------------------------------------------------------------ estimated creatinine clearance is 23.1 ml/min (by C-G formula based on Cr of 4.98). ------------------------------------------------------------------------------------------------------------------ No results found for this basename: HGBA1C:2 in the last 72 hours ------------------------------------------------------------------------------------------------------------------ No results found for this basename: CHOL:2,HDL:2,LDLCALC:2,TRIG:2,CHOLHDL:2,LDLDIRECT:2 in the last 72 hours ------------------------------------------------------------------------------------------------------------------ No results found for this basename: TSH,T4TOTAL,FREET3,T3FREE,THYROIDAB in the  last 72 hours ------------------------------------------------------------------------------------------------------------------ No results found for this basename: VITAMINB12:2,FOLATE:2,FERRITIN:2,TIBC:2,IRON:2,RETICCTPCT:2 in the last 72 hours  Coagulation profile No results found for this basename: INR:5,PROTIME:5 in the last 168 hours  No results found for this basename: DDIMER:2 in the last 72 hours  Cardiac Enzymes No results found for this basename: CK:3,CKMB:3,TROPONINI:3,MYOGLOBIN:3 in the last 168  hours ------------------------------------------------------------------------------------------------------------------ No components found with this basename: POCBNP:3  Micro Results Recent Results (from the past 240 hour(s))  URINE CULTURE     Status: Normal   Collection Time   09/02/11 11:39 AM      Component Value Range Status Comment   Specimen Description URINE, CLEAN CATCH   Final    Special Requests NONE   Final    Culture  Setup Time [id number]   Final    Colony Count NO GROWTH   Final    Culture NO GROWTH   Final    Report Status 09/03/2011 FINAL   Final     Radiology Reports Ct Abdomen Pelvis Wo Contrast  08/27/2011  *RADIOLOGY REPORT*  Clinical Data: Worsening bilateral lower quadrant abdominal pain. Surgical history includes recent left superficial inguinal lymph node resection, appendectomy, renal transplant, and hernia repair.  CT ABDOMEN AND PELVIS WITHOUT CONTRAST 08/27/2011:  Technique:  Multidetector CT imaging of the abdomen and pelvis was performed following the standard protocol without intravenous contrast.  Comparison: Unenhanced CT abdomen and pelvis 08/22/2011, 08/04/2011.  Findings: Interval slight increase in size of the fluid collection in the left superficial inguinal region (overlying the common femoral artery and vein, not within the left inguinal canal), current measurements approximating 3.9 x 3.9 cm (series 2, image 97), previously 4.1 x 3.3 cm.  The fluid collection has a Hounsfield measurement of 6, indicating simple fluid.  On the prior examination, gas bubbles were present within the collection, and this is in the area of the recent lymph node resection.  Interval development of thickening of the wall of the gallbladder, with edema/inflammation surrounding the gallbladder.  No biliary ductal dilation.  Normal low dose unenhanced appearance of the liver, spleen, and adrenal glands.  Stable mild pancreatic atrophy. Atrophic native kidneys consistent with  end-stage renal disease, without focal parenchymal abnormality.  Moderate to severe aorto- iliofemoral and visceral artery atherosclerosis without aneurysm.  Lymphadenopathy in the retroperitoneum of the abdomen and both sides of the pelvis, unchanged.  Bilateral superficial inguinal lymphadenopathy, unchanged.  No new or enlarging lymphadenopathy. Index left retroperitoneal node approximately 3-4 cm above the aortic bifurcation measures approximately 2.3 x 1.7 cm (series 2, image 46).  Index right retrocrural lymph node is stable at 1.1 cm (series 2, image 19).  Stomach and small bowel normal in appearance.  Distal descending and sigmoid colon diverticulosis without evidence of acute diverticulitis.  Remainder of the colon unremarkable.  Apparent wall thickening in the cecum is felt to be related to decompression.  Lipoma again noted involving the ileocecal valve. No ascites.  Satisfactory appearance of the renal transplant in the left side of the pelvis, without evidence of hydronephrosis or focal parenchymal abnormality.  Urinary bladder unremarkable.  Prostate gland and seminal vesicles normal.  No evidence of recurrent right inguinal hernia.  Diffuse body wall edema.  Bone window images demonstrate degenerative changes involving the lower thoracic and upper lumbar spine.  Interval increase in size of the now large pericardial effusion. Interval increase in size of a small bilateral pleural effusions. Associated mild passive atelectasis in the lower lobes.  IMPRESSION:  1.  Interval slight  increase in size of the postoperative seroma or liquefied hematoma in the left superficial inguinal region at the site of prior lymph node resection. 2.  Stable retroperitoneal and bilateral superficial inguinal lymphadenopathy. 3.  CT findings consistent with acute cholecystitis, new. 4.  Interval increase in size of a now large pericardial effusion. 5.  Interval increase in size of small bilateral pleural effusions.  Associated passive atelectasis in the lower lobes, left greater than right. 6.  Distal descending and sigmoid colon diverticulosis without evidence of acute diverticulitis. 7.  Satisfactory unenhanced appearance of the renal transplant in the left side of the pelvis.  These results were called by telephone on 08/27/2011  at  1210 hours to  Dr. Maryan Rued of the emergency department, who verbally acknowledged these results.  Original Report Authenticated By: Deniece Portela, M.D.   Ct Abdomen Pelvis Wo Contrast  08/22/2011  *RADIOLOGY REPORT*  Clinical Data: Abdominal distention, constipation  CT ABDOMEN AND PELVIS WITHOUT CONTRAST  Technique:  Multidetector CT imaging of the abdomen and pelvis was performed following the standard protocol without intravenous contrast.  Comparison: 08/06/2011  Findings: There is small pericardial effusion measures 7 mm maximum thickness.  Atherosclerotic calcifications of the coronary arteries. Small left pleural effusion with left base posterior atelectasis.  NG tube with tip coiled within stomach.  Sagittal images of the spine shows degenerative changes lower thoracic spine.  The study is limited without IV contrast.  The unenhanced liver is unremarkable. Unenhanced pancreas, spleen and adrenals are unremarkable. Again noted renal atrophy and chronic renal cortical thinning. Vascular calcifications bilateral kidney are stable.  Again noted multiple enlarged retroperitoneal and mesenteric lymph nodes.  A left periaortic lymph node in axial image 37 measures 2 x 1.6 cm.  Moderate stool noted in the right colon.  Small amount of layering sludge  or  tiny gallstones are suspected within dependent gallbladder.  Atherosclerotic calcifications are again noted SMA, splenic artery bilateral renal artery origin.  Again noted a transplanted kidney in the left iliac fossa stable in appearance without evidence of hydronephrosis or hydroureter.  Mild distended urinary bladder.  Oral contrast  material was given to the patient.  No small bowel or colonic obstruction.  Enlarged lymph nodes bilateral inguinal region are again noted.  The largest right inguinal lymph node measures 1.7 x 1.9 cm.  The largest left inguinal lymph node measures 1.7 x 1.5 cm.  There is a fluid collection in the left inguinal region measures 12 HU in attenuation.  This measures 4 x 3.3 cm.  May represent a resolving seroma or hematoma.  There is thickening of the skin anterior abdominal wall and mild subcutaneous anasarca infiltration of the subcutaneous fat.  A small midline lower ventral hernia containing fat measures 3.5 cm only partially visualized.  Postsurgical changes are noted right inguinal region.  No destructive bony lesions are noted within pelvis.  IMPRESSION:  1.  There is small pericardial effusion.  Small left pleural effusion with left base posteriorly atelectasis. 2.  Again noted atrophic native kidneys.  Stable transplanted kidney in the left iliac fossa.  No hydronephrosis or hydroureter. 3.  Again noted retroperitoneal and mesenteric enlarged lymph nodes.  Bilateral enlarged inguinal lymph nodes again noted. 4.  There is a small fluid collection in the left inguinal region measures 4 x 3.3 cm.  This may represent a seroma or resolving hematoma.  Less likely inguinal abscess. 5.  Moderate stool noted in the right colon.  No small bowel or colonic  obstruction.  Original Report Authenticated By: Lahoma Crocker, M.D.   Ct Abdomen Pelvis Wo Contrast  08/04/2011  *RADIOLOGY REPORT*  Clinical Data: Recurrent renal failure requiring hemodialysis. History of renal transplant.  CT ABDOMEN AND PELVIS WITHOUT CONTRAST  Technique:  Multidetector CT imaging of the abdomen and pelvis was performed following the standard protocol without intravenous contrast.  Comparison: Right upper quadrant abdominal ultrasound 07/14/2011. Abdominal CT 08/26/2004.  Findings: Images through the lung bases demonstrate cardiomegaly and  hemodialysis catheters within the right atrium.  There is vascular congestion and mild bibasilar atelectasis.  No focal abnormalities are seen within the liver, spleen, gallbladder, pancreas or adrenal glands.  The spleen is slightly larger than on the prior CT, but without focal abnormality.  There is severe chronic cortical thinning of the native kidneys bilaterally.  Appearance is unchanged.  The transplanted kidney in the left iliac fossa appears stable without hydronephrosis, significant cortical thinning or surrounding inflammatory change.  The patient has developed multiple enlarged retroperitoneal and mesenteric lymph nodes.  Examples include an 11 mm right retrocrural node on image 16, a mesenteric node measuring 3.6 x 2.3 cm on image 31, a left periaortic node measuring 2.3 x 3.1 cm on image 46 and a 2.0 x 3.8 cm left external iliac node on image 79. In addition, there are several mildly enlarged lymph nodes within the left groin.  There are postsurgical changes in the right groin without significant adenopathy.  No bowel lesions are identified.  There is no evidence of bowel obstruction.  The bladder contents demonstrate mildly increased density.  There is no evidence of diskitis or other acute osseous abnormality.  IMPRESSION:  1.  Interval development of multi focal lymphadenopathy suspicious for lymphoma.  Enlarged lymph nodes in the left groin are most accessible for excisional biopsy. 2.  Slight interval increase in splenic size without significant splenomegaly or focal abnormality. 3.  Transplanted kidney in the left iliac fossa has a stable appearance without hydronephrosis or perinephric soft tissue stranding.  Chronic atrophy of the native kidneys appears unchanged. 4.  High density urine within the urinary bladder.  Original Report Authenticated By: Vivia Ewing, M.D.   Dg Chest 2 View  08/02/2011  *RADIOLOGY REPORT*  Clinical Data: History of difficulty breathing.  History of fever.   CHEST - 2 VIEW  Comparison: 07/22/2011.  08/01/2011.  Findings: Right sided dialysis catheter has tips in right atrial region.  There is stable cardiac silhouette enlargement.  There is upper lobe vascular prominence with pulmonary venous hypertension vascular congestion pattern.  No consolidation or definite pleural effusion is seen.  There is slight elevation of the margin of the left hemidiaphragm with minimal basilar atelectasis. On the lateral image there appears to be some posterior atelectasis and patchy infiltrative density without consolidation.  There is minimal degenerative spondylosis.  IMPRESSION: Dialysis catheter in place.  Stable cardiac silhouette enlargement. Pulmonary venous hypertension vascular congestion pattern.  No consolidation or pleural effusion. Left basilar and posterior atelectasis and patchy infiltrative densities without consolidation.  Original Report Authenticated By: Delane Ginger, M.D.   Nm Hepatobiliary Liver Func  08/28/2011  *RADIOLOGY REPORT*  Clinical data: Abdominal pain.  Gallbladder wall thickening on ultrasound.  HEPATOBILIARY SCINTIGRAPHY WITH EJECTION FRACTION  Anterior imaging after5.27mCi Tc99M Choletec IV. There is prompt clearance of the radiopharmaceutical from the blood pool. Timely visualization of activity in central bile ducts, small bowel, and gallbladder. After 1 hour,2.3 mcg CCK was infused intravenously and imaging continued. The patient describes no  pain with infusion. The calculated gallbladder ejection fraction over 30 minutes is 86% (normal >30% at  thirty minutes [facility] et al., 1992 J. Nuclear Med).  IMPRESSION 1. Patency of cystic and common bile ducts. 2. Normal gallbladder ejection fraction.  Original Report Authenticated By: Trecia Rogers, M.D.   US Abdomen Complete  08/27/2011  *RADIOLOGY REPORT*  Clinical Data:  Right upper quadrant pain, nausea; history of kidney transplant  ABDOMINAL ULTRASOUND COMPLETE  Comparison:  None.  Findings:   Gallbladder:   No gallstones are identified at but there is gallbladder wall thickening and pericholecystic fluid.  Common Bile Duct:  Within normal limits in caliber. Measures 3 mm.  Liver: No focal mass lesion identified.  Within normal limits in parenchymal echogenicity.  IVC:  Appears normal.  Pancreas:  No abnormality identified.  Spleen:  Within normal limits in size and echotexture.  Right kidney:  Atrophic. No evidence of mass or hydronephrosis.  Left kidney: Atrophic.  No evidence of mass or hydronephrosis.  The left lower quadrant transplanted kidney is not imaged.  Abdominal Aorta:  No aneurysm identified.  IMPRESSION: Acalculous cholecystitis with gallbladder wall thickening and pericholecystic fluid.  Original Report Authenticated By: Duayne Cal, M.D.   US Biopsy  08/05/2011  *RADIOLOGY REPORT*  Clinical history:46 year old with history of renal transplant and fevers.  The patient has lymphadenopathy in the retroperitoneum and left inguinal region.  PROCEDURE(S): ULTRASOUND GUIDED BIOPSY OF LEFT INGUINAL LYMPH NODE.  Physician: Stephan Minister. Henn, MD  Medications:None  Moderate sedation time:None  Fluoroscopy time: None  Procedure:The procedure was explained to the patient.  The risks and benefits of the procedure were discussed and the patient's questions were addressed.  Informed consent was obtained from the patient.  The left inguinal area was evaluated with ultrasound. Enlarged lymph nodes were identified.  Prominent lymph node anterior to the saphenofemoral junction of the left common femoral vein was targeted.  The left groin was prepped and draped in a sterile fashion.  The skin was anesthetized with 1% lidocaine.  18 gauge core needle was directed into the lymph node with ultrasound guidance.  Five core biopsies were obtained.  Samples placed in saline.  Specimens sent for pathology and microbiology.  Findings:Multiple enlarged left inguinal lymph nodes.  Prominent lymph node anterior to the  saphenofemoral junction was biopsied. No significant bleeding following the core biopsies.  Complications: None  Impression:Ultrasound guided core biopsies of a large left inguinal lymph node.  Samples sent for pathology and microbiology.  Original Report Authenticated By: Markus Daft, M.D.   Dg Chest Port 1 View  08/31/2011  *RADIOLOGY REPORT*  Clinical Data: Shortness of breath and chest pressure.  Dialysis.  PORTABLE CHEST - 1 VIEW  Comparison: 08/23/2011  Findings: Marked cardiac enlargement.  Mild vascular congestion without overt failure or focal infiltrates.  New left base process, likely pleural effusion, with left lower lobe retrocardiac subsegmental atelectasis, not appreciated on priors.  Dialysis catheter good position and unchanged from priors.  No pneumothorax.  IMPRESSION: Marked cardiac enlargement with slight vascular congestion. New left pleural effusion with left base   subsegmental atelectasis.  Original Report Authenticated By: Staci Righter, M.D.   Dg Chest Portable 1 View  08/23/2011  *RADIOLOGY REPORT*  Clinical Data: Shortness of breath  PORTABLE CHEST - 1 VIEW  Comparison: 08/03/2011  Findings: Cardiomegaly.  Central vascular congestion. Mild opacity at the left lung base.  Otherwise, no focal consolidation.  No overt edema.  No pleural effusion or  pneumothorax.  Large bore right subclavian catheter tip projects over the cavoatrial junction.  No acute osseous abnormality identified.  IMPRESSION: Cardiomegaly.  Vascular congestion without overt edema.  Mild left lung base opacity; atelectasis versus infiltrate.  Original Report Authenticated By: Suanne Marker, M.D.   Dg Chest Port 1 View  08/03/2011  *RADIOLOGY REPORT*  Clinical Data: Cough and shortness of breath.  PORTABLE CHEST - 1 VIEW  Comparison: 08/12/2011.  Findings: The diatek catheter is stable.  The heart is enlarged but stable.  The lungs are better aerated with resolving edema.  No effusions.  IMPRESSION: Slight  improved lung aeration.  Vascular congestion without pulmonary edema.  Original Report Authenticated By: P. Kalman Jewels, M.D.   Dg Chest Port 1 View  08/01/2011  *RADIOLOGY REPORT*  Clinical Data: Central line placement.  PORTABLE CHEST - 1 VIEW  Comparison: 07/22/2011.  Findings: 1504 hours.  Right IJ dialysis catheter tips are present within the right atrium.  There is stable cardiomegaly with chronic vascular congestion.  No confluent airspace opacity, significant pleural effusion or pneumothorax is seen.  IMPRESSION: Dialysis catheter placement without demonstrated pneumothorax. Stable cardiomegaly and vascular congestion.  Original Report Authenticated By: Vivia Ewing, M.D.   Dg Abd Acute W/chest  08/27/2011  *RADIOLOGY REPORT*  Clinical Data: Abdominal pain and shortness of breath.  ACUTE ABDOMEN SERIES (ABDOMEN 2 VIEW & CHEST 1 VIEW)  Comparison: Chest x-ray fourth 32,013.  Findings: The upright chest x-ray demonstrates stable cardiac enlargement.  The right IJ diatek catheter is stable.  There is vascular congestion and mild pulmonary edema with small pleural effusions.  Two views of the abdomen demonstrate an unremarkable bowel gas pattern.  There is scattered air and stool in the colon.  No distended small bowel loops to suggest obstruction.  No free air. The bony structures are intact.  IMPRESSION:  1.  Cardiac enlargement with vascular congestion, mild interstitial edema and small effusions. 2.  No plain film findings for an acute abdominal process.  Original Report Authenticated By: P. Kalman Jewels, M.D.   Dg Abd Acute W/chest  08/22/2011  *RADIOLOGY REPORT*  Clinical Data: Abdominal pain and constipation.  ACUTE ABDOMEN SERIES (ABDOMEN 2 VIEW & CHEST 1 VIEW)  Comparison: Chest x-ray 08/03/2011.  Abdominal x-ray 04/06/2008.  Findings: There is a right internal jugular PermCath with the tips in the right atrium.  Lung volumes are normal.  Minimal dependent bibasilar opacities are  favored to reflect areas of subsegmental atelectasis.  No definite pleural effusions.  Mild pulmonary venous congestion without frank pulmonary edema.  Heart size is mildly enlarged (unchanged).  Mediastinal contours are unremarkable. Atherosclerotic calcifications within the arch of the aorta.  No pneumoperitoneum  Supine and upright views of the abdomen demonstrate gas and stool scattered throughout the colon extending to the level of the distal rectum.  There are some borderline and mildly dilated loops of gas- filled small bowel (up to 4.2 cm in diameter) throughout the central and upper abdomen.  No definite air-fluid levels are noted on the upright projection.  IMPRESSION: 1.  Nonspecific bowel gas pattern, as above.  Findings may simply reflect an ileus, however, clinical correlation for signs and symptoms of early or partial small bowel obstruction is recommended. 2.  No pneumoperitoneum. 3.  No radiographic evidence of acute cardiopulmonary disease. 4.  Mild cardiomegaly is unchanged. 5.  Support apparatus, as above.  Original Report Authenticated By: Etheleen Mayhew, M.D.    Scheduled Meds:    .  aspirin  81 mg Oral Daily  . calcium acetate  667 mg Oral TID WC  . cycloSPORINE modified  75 mg Oral BID  . darbepoetin      . darbepoetin (ARANESP) injection - DIALYSIS  100 mcg Intravenous Q Tue-HD  . diphenhydrAMINE      . doxycycline  100 mg Oral Q12H  . feeding supplement  1 Container Oral TID BM  . ferric gluconate (FERRLECIT/NULECIT) IV  125 mg Intravenous Weekly  . HYDROcodone-acetaminophen  1 tablet Oral BID  . insulin aspart  0-9 Units Subcutaneous TID WC  . insulin glargine  18 Units Subcutaneous Daily  . labetalol  100 mg Oral QHS  . levothyroxine  75 mcg Oral QAC breakfast  . multivitamin  1 tablet Oral Daily  . paricalcitol      . paricalcitol  2 mcg Intravenous Q T,Th,Sa-HD  . polyethylene glycol  17 g Oral BID  . predniSONE  5 mg Oral Q breakfast  . DISCONTD: darbepoetin  (ARANESP) injection - DIALYSIS  100 mcg Intravenous Q Thu-HD  . DISCONTD: piperacillin-tazobactam (ZOSYN)  IV  2.25 g Intravenous Q8H   Continuous Infusions:  PRN Meds:.sodium chloride, sodium chloride, albumin human, albuterol, bisacodyl, diphenhydrAMINE, heparin, HYDROmorphone (DILAUDID) injection, lidocaine, lidocaine-prilocaine, nitroGLYCERIN, ondansetron (ZOFRAN) IV, ondansetron, pentafluoroprop-tetrafluoroeth, DISCONTD: heparin  Assessment & Plan   Interim summary- [name] is a 46 year old gentleman with history of diabetes mellitus insulin-dependent, left BKA, renal failure status post renal transplant at [facility] in [facility], patient has long since had transplant failure requiring dialysis on a routine basis, he has been admitted several times to the hospital for made his medical problems. He was discharged from this hospital about 12 days ago following which he presented again to ER with some abdominal pain, usually there was suspicion of acute cholecystitis however it was ruled out with HIDA scan. Subsequent imaging studies and colonoscopy indicated that patient had ischemic colitis due to hypotensive episodes during dialysis.  Patient was seen by GI and general surgery, agents blood pressure medications were titrated by nephrologist with good results his blood pressures have improved and his ischemic colitis seems to be in good control.   Should about 2-3 days ago around 09/02/2011 started complaining of left lower quadrant pain around his transplant kidney site, CT scan and ultrasound did not show signs of any acute infection his UA has not grown any bugs yet, case was discussed with [facility] Dr. Linward Foster, it was decided that per his imaging studies there is some swelling and soft tissue edema in the left lower quadrant but none around the transplant kidney. I had reconsulted surgery on 09/05/2011 to evaluate if there is any soft tissue abscess, surgery saw the patient and concurred that  there was no fluid collection or abscess, patient continues to be afebrile with normal white blood cell count will stop IV Zosyn and put him on by mouth doxycycline. Clinically he feels better with less pain and tenderness at the site.     1. Abdominal pain   CT scan showed findings suggestive of possible acute cholecystitis , however HIDA scan was negative, most likely pain was secondary to ischemic colitis from hyperperfusion caused by low blood pressures during dialysis, GI following and colonic biopsies are consistent with the same. Currently feels better from abdominal pain standpoint , continue bowel regimen as recommended by GI . For now GI and surgery has signed off. Patient will need outpatient GI followup with Dr. Elmo Putt post discharge.  Patient has  repeat left-sided abdominal pain at the site of his transplant kidney on 09/02/2011, UA is questionable for UTI cultures are pending, patient has been kept on empiric Zosyn, his left flank transplant kidney site tenderness continues, however no evidence of transplant kidney infection or acute change was found on abdominal CT done yesterday, we'll continue to monitor CBC and signs of infection, there is a possibility that this is acute graft rejection in the making have discussed his case with [facility] kidney transplant surgeon Dr. Linward Foster cell phone number [phone] [facility] transfer [facility] phone number [phone] on 09/04/2011. Dr. Linward Foster requested ultrasound of the post transplant kidney site, ultrasound showed soft tissue edema, ultrasound results were discussed with Dr. Linward Foster again on 09/05/2011 and he suggested that at this time there is no indication for nephrectomy as this appears to be more of a soft tissue problem.   I had reconsulted surgery on 09/05/2011 to evaluate if there is any soft tissue abscess, surgery saw the patient and concurred that there was no fluid collection or abscess, patient continues to be afebrile with  normal white blood cell count will stop IV Zosyn and put him on by mouth doxycycline. Clinically he feels better with less pain and tenderness at the site.      2. ESRD (end stage renal disease) - appears to have failure of transplanted kidneys, Renal serviceis following. Cont dialysis TTS.  Cont prednisone 5 mg and cyclosporin 75 mg (dose recently decreased). Outpatient tapering to be continued.    3.Diabetes mellitus - Patient was on insulin pump as outpatient which is currently turned off, his Lantus and  bedtime NovoLog have been adjusted in the light of hypoglycemic event on 09/05/2011, will add pre-meal NovoLog for better control.   CBG (last 3)   Basename 09/06/11 0017 09/05/11 2122 09/05/11 1651  GLUCAP 281* 262* 189*     Lab Results  Component Value Date   HGBA1C 6.8* 08/23/2011      4. Hypertension: Blood pressure better, continue to monitor blood pressures on the decreased Labetalol dose and will also add parameters to hold for SBP<110,HR,60.     5.Obstructive sleep apnea -  Continue CPAP, as tolerated by the patient.    6.ACUTE DIASTOLIC CHF (congestive heart failure)/pericardial effusion - currently resolved after fluid removal from dialysis, patient's volume status is very fragile and goes into pulmonary vascular congestion with slight stress.  For His pericardial effusion patient has been seen by cardiology and has had 2 echoes, no further interventions per cardiology for pericardial effusion.  He still intermittently requires oxygen, on room air during his off dialysis days his pulse ox drops to mid 80s, prior to dialysis today his pulse ox was 88% on room air, after 2 L nasal cannula oxygen per minute it came up to 95%. He will be prescribed 2 L nasal cannula oxygen per minute at home.     7.Hypothyroidism: -Continue Synthroid, elevated TSH but normal free T4 level will keep on current dose of synthroid . Outpatient TSH monitoring.    8.Anemia  -anemia of chronic disease - Aranesp as per renal ,  H&H is stable.    9.Right lower extremity ulcer/chronic erythema & S/P BKA (below knee amputation)   Wound care follow up.       CODE STATUS: Full code for now however stated that he he does not wish to be on prolonged life support   DVT Prophylaxis SCDs - patient remains noncompliant was counseled about the risks of DVT,  PE and death.    Updated wife bedside on 08/31/2011.  [name] M.D on 09/06/2011 at 10:41 AM  [facility] Office  [phone]

## 2011-09-06 NOTE — Discharge Instructions (Signed)
Follow with Primary MD Windy Kalata, MD, MD in 3 days   Get CBC, CMP, checked 3 days by Primary MD and again as instructed by your Primary MD.    Get Medicines reviewed and adjusted.  Please request your Prim.MD to go over all Hospital Tests and Procedure/Radiological results at the follow up, please get all Hospital records sent to your Prim MD by signing hospital release before you go home.  Activity: As tolerated with Full fall precautions use walker/cane & assistance as needed  Diet: Renal-Low Salt,  Fluid restriction 1.5 lit/day, Aspiration precautions.  Check your Weight same time everyday, if you gain over 2 pounds, or you develop in leg swelling, experience more shortness of breath or chest pain, call your Primary MD immediately. Follow Cardiac Low Salt Diet and 1.5 lit/day fluid restriction.  Disposition Home  If you experience worsening of your admission symptoms, develop shortness of breath, life threatening emergency, suicidal or homicidal thoughts you must seek medical attention immediately by calling 911 or calling your MD immediately  if symptoms less severe.  You Must read complete instructions/literature along with all the possible adverse reactions/side effects for all the Medicines you take and that have been prescribed to you. Take any new Medicines after you have completely understood and accpet all the possible adverse reactions/side effects.   Do not drive if your were admitted for syncope or siezures until you have seen by Primary MD or a Neurologist and advised to drive.  Do not drive when taking Pain medications.    Do not take more than prescribed Pain, Sleep and Anxiety Medications  Special Instructions: If you have smoked or chewed Tobacco  in the last 2 yrs please stop smoking, stop any regular Alcohol  and or any Recreational drug use.  Wear Seat belts while driving.

## 2011-09-06 NOTE — Progress Notes (Signed)
a  CARE MANAGEMENT NOTE 09/06/2011  Patient:  Dale [name], Dale [name]   Account Number:  [id number]  Date Initiated:  08/29/2011  Documentation initiated by:  Lizabeth Leyden  Subjective/Objective Assessment:   Patient admitted with abdominal pain, acute cholecystitis.     Action/Plan:   Progression of care and discharge planning  09/06/2011 Pt for d/c to home with HHPT and [place], home oxygen   Anticipated DC Date:  09/06/2011   Anticipated DC Plan:  [place]         [facility] Choice  [place]   Choice offered to / List presented to:  C-1 Patient   DME arranged  [facility]      DME agency  [place] arranged  HH-1 RN  [place].   Status of service:  Completed, signed off Medicare Important Message given?   (If response is "NO", the following Medicare IM given date fields will be blank) Date Medicare IM given:   Date Additional Medicare IM given:    Discharge Disposition:  [place]  Per UR Regulation:  Reviewed for med. necessity/level of care/duration of stay  If discussed at [place] of Stay Meetings, dates discussed:    Comments:  09/06/2011 Pt for d/c today, met with pt and offered choice of [place] agencies, and pt selected [facility] for [facility] and HHPT. Will also use [facility] for home oxygen. Dale Pang RN MPH Case Manager [phone]  08/29/11 Dale Pinks, RN, BSN 1652 PT HAS BEEN READMITTED WITH ABD PAIN AND HAS HAD A COLONOSCOPY TODAY.  PT HAS BEEN DC'D IN THE PAST WITH HH PT WITH [facility].  WILL F/U.  PT AND WIFE NOT HAPPY WITH [facility] AS THEY DIDNT COME OUT TO SEE THE PT IMMEDIATELY AT DC AS THEY TOLD HER THAT THEY WERE BACKED UP AND THE PT HAS BEEN IN AND OUT OF THE HOSPITAL AS [facility] HAS BEEN FOLLOWING UP WITH THEM THROUGH THIS TIME PERIOD.  PT AND HIS WIFE WERE INFORMED THAT THEY CAN CHOOSE ANOTHER AGENCY AT [place].  08/29/2011 [place] Utilization  review completed.  Patient recent inpatient 08/22/2011 to 08/26/2011 constipation and  HCAP. Medical history renal transplant, hemodialysis.

## 2011-09-09 ENCOUNTER — Emergency Department (HOSPITAL_COMMUNITY): Payer: Medicare Other

## 2011-09-09 ENCOUNTER — Encounter (HOSPITAL_COMMUNITY): Payer: Self-pay | Admitting: Emergency Medicine

## 2011-09-09 ENCOUNTER — Emergency Department (HOSPITAL_COMMUNITY)
Admission: EM | Admit: 2011-09-09 | Discharge: 2011-09-09 | Disposition: A | Discharge status: Home / Self Care | Payer: Medicare Other | Attending: Emergency Medicine | Admitting: Emergency Medicine

## 2011-09-09 ENCOUNTER — Telehealth: Payer: Self-pay | Admitting: Internal Medicine

## 2011-09-09 DIAGNOSIS — R0602 Shortness of breath: Secondary | ICD-10-CM | POA: Insufficient documentation

## 2011-09-09 DIAGNOSIS — Z940 Kidney transplant status: Secondary | ICD-10-CM | POA: Insufficient documentation

## 2011-09-09 DIAGNOSIS — E119 Type 2 diabetes mellitus without complications: Secondary | ICD-10-CM | POA: Insufficient documentation

## 2011-09-09 DIAGNOSIS — K5900 Constipation, unspecified: Secondary | ICD-10-CM | POA: Insufficient documentation

## 2011-09-09 DIAGNOSIS — I10 Essential (primary) hypertension: Secondary | ICD-10-CM | POA: Insufficient documentation

## 2011-09-09 DIAGNOSIS — Z794 Long term (current) use of insulin: Secondary | ICD-10-CM | POA: Insufficient documentation

## 2011-09-09 DIAGNOSIS — R109 Unspecified abdominal pain: Secondary | ICD-10-CM | POA: Insufficient documentation

## 2011-09-09 LAB — COMPREHENSIVE METABOLIC PANEL
ALT: 10 U/L (ref 0–53)
AST: 16 U/L (ref 0–37)
Albumin: 2.3 g/dL — ABNORMAL LOW (ref 3.5–5.2)
Alkaline Phosphatase: 148 U/L — ABNORMAL HIGH (ref 39–117)
BUN: 10 mg/dL (ref 6–23)
CO2: 32 mEq/L (ref 19–32)
Calcium: 9.4 mg/dL (ref 8.4–10.5)
Chloride: 99 mEq/L (ref 96–112)
Creatinine, Ser: 2.24 mg/dL — ABNORMAL HIGH (ref 0.50–1.35)
GFR calc Af Amer: 39 mL/min — ABNORMAL LOW (ref >90)
GFR calc non Af Amer: 34 mL/min — ABNORMAL LOW (ref >90)
Glucose, Bld: 89 mg/dL (ref 70–99)
Potassium: 3.9 mEq/L (ref 3.5–5.1)
Sodium: 138 mEq/L (ref 135–145)
Total Bilirubin: 0.5 mg/dL (ref 0.3–1.2)
Total Protein: 7.8 g/dL (ref 6.0–8.3)

## 2011-09-09 LAB — DIFFERENTIAL
Basophils Absolute: 0.1 10*3/uL (ref 0.0–0.1)
Basophils Relative: 1 % (ref 0–1)
Eosinophils Absolute: 0.4 10*3/uL (ref 0.0–0.7)
Eosinophils Relative: 6 % — ABNORMAL HIGH (ref 0–5)
Lymphocytes Relative: 26 % (ref 12–46)
Lymphs Abs: 1.9 10*3/uL (ref 0.7–4.0)
Monocytes Absolute: 0.8 10*3/uL (ref 0.1–1.0)
Monocytes Relative: 11 % (ref 3–12)
Neutro Abs: 4.1 10*3/uL (ref 1.7–7.7)
Neutrophils Relative %: 56 % (ref 43–77)

## 2011-09-09 LAB — CBC
HCT: 32.8 % — ABNORMAL LOW (ref 39.0–52.0)
Hemoglobin: 9.8 g/dL — ABNORMAL LOW (ref 13.0–17.0)
MCH: 27.8 pg (ref 26.0–34.0)
MCHC: 29.9 g/dL — ABNORMAL LOW (ref 30.0–36.0)
MCV: 92.9 fL (ref 78.0–100.0)
Platelets: 221 10*3/uL (ref 150–400)
RBC: 3.53 MIL/uL — ABNORMAL LOW (ref 4.22–5.81)
RDW: 21 % — ABNORMAL HIGH (ref 11.5–15.5)
WBC: 7.3 10*3/uL (ref 4.0–10.5)

## 2011-09-09 LAB — OCCULT BLOOD, POC DEVICE: Fecal Occult Bld: NEGATIVE

## 2011-09-09 LAB — GLUCOSE, CAPILLARY: Glucose-Capillary: 69 mg/dL — ABNORMAL LOW (ref 70–99)

## 2011-09-09 LAB — LACTIC ACID, PLASMA: Lactic Acid, Venous: 1.3 mmol/L (ref 0.5–2.2)

## 2011-09-09 MED ORDER — HYDROMORPHONE HCL PF 1 MG/ML IJ SOLN
0.5000 mg | INTRAMUSCULAR | Status: AC
  Administered 2011-09-09: 0.5 mg via INTRAVENOUS
  Filled 2011-09-09: qty 1

## 2011-09-09 MED ORDER — HYDROMORPHONE HCL PF 1 MG/ML IJ SOLN
0.5000 mg | INTRAMUSCULAR | Status: AC
Start: 2011-09-09 — End: 2011-09-09
  Administered 2011-09-09: 0.5 mg via INTRAVENOUS
  Filled 2011-09-09: qty 1

## 2011-09-09 NOTE — ED Notes (Signed)
Reports in and out of hospital for 6 weeks, last d/c was Monday; reports passing gas and having watery bowels- thinks has small bowel obstruction; GI MD is Dr Edsel Petrin; MD told him that they think he has impaction; reports lower abd pain- described as cramping/pressure pain

## 2011-09-09 NOTE — ED Notes (Signed)
Pt reports decrease pain and denies any questions upon discharge.

## 2011-09-09 NOTE — ED Provider Notes (Signed)
History     CSN: [id number]  Arrival date & time 09/09/11  1705   First MD Initiated Contact with Patient 09/09/11 1908      Chief Complaint  Patient presents with  . Abdominal Pain    (Consider location/radiation/quality/duration/timing/severity/associated sxs/prior treatment) Patient is a 46 y.o. male presenting with abdominal pain.  Abdominal Pain The primary symptoms of the illness include abdominal pain. The primary symptoms of the illness do not include fever, shortness of breath, nausea, vomiting, diarrhea or dysuria. The current episode started yesterday. The onset of the illness was gradual. The problem has been gradually worsening.  The abdominal pain began yesterday. The pain came on gradually. The abdominal pain has been gradually worsening since its onset. The abdominal pain is generalized. The abdominal pain does not radiate. The severity of the abdominal pain is 8/10. The abdominal pain is relieved by nothing. Exacerbated by: palpation.  Risk factors for an acute abdominal problem include immunosuppression. Symptoms associated with the illness do not include hematuria.    Past Medical History  Diagnosis Date  . Insulin dependent diabetes mellitus   . Hypertension   . History of blood transfusion   . ESRD (end stage renal disease)     a. 1995 s/p cadaveric transplant w/ susbequent failure after 18 yrs;  b.Dialysis initiated 07/2011  . Dyspnea     a. 07/23/11 Echo: EF 55-60%  . Anemia of chronic disease   . Cholecystitis     a. 08/27/2011  . Constipation   . Pericardial effusion     a.  Small by CT 08/22/11;  b.  Large by CT 08/27/11  . Inguinal lymphadenopathy     a. bilateral - s/p biopsy 07/2011    Past Surgical History  Procedure Date  . Appendectomy   . Hernia repair   . Kidney transplant   . Cataract extraction   . Arteriovenous graft placement   . Av fistula placement   . Toe amputation   . Below knee leg amputation   . Carpal tunnel release   .  Shoulder surgery   . Av fistula placement 07/29/2011    Procedure: ARTERIOVENOUS (AV) FISTULA CREATION;  Surgeon: Mal Misty, MD;  Location: [place];  Service: Vascular;  Laterality: Right;  Brachial cephalic  . Insertion of dialysis catheter 08/01/2011    Procedure: INSERTION OF DIALYSIS CATHETER;  Surgeon: Rosetta Posner, MD;  Location: [place];  Service: Vascular;  Laterality: Right;  insertion of dialysis catheter on right internal jugular vein  . Lymph node biopsy 08/10/2011    Procedure: LYMPH NODE BIOPSY;  Surgeon: Merrie Roof, MD;  Location: [place];  Service: General;  Laterality: Left;  left groin lymph biopsy  . Colonoscopy 08/29/2011    Procedure: COLONOSCOPY;  Surgeon: Jerene Bears, MD;  Location: [place];  Service: Gastroenterology;  Laterality: N/A;    Family History  Problem Relation Age of Onset  . Malignant hyperthermia Neg Hx     History  Substance Use Topics  . Smoking status: Never Smoker   . Smokeless tobacco: Never Used  . Alcohol Use: Yes     occasionally 1xmonth      Review of Systems  Constitutional: Negative for fever.  HENT: Negative for rhinorrhea, drooling and neck pain.   Eyes: Negative for pain.  Respiratory: Negative for cough and shortness of breath.   Cardiovascular: Negative for chest pain and leg swelling.  Gastrointestinal: Positive for abdominal pain. Negative for nausea, vomiting and  diarrhea.  Genitourinary: Negative for dysuria and hematuria.       Constipation  Musculoskeletal: Negative for gait problem.  Skin: Negative for color change.  Neurological: Negative for numbness and headaches.  Hematological: Negative for adenopathy.  Psychiatric/Behavioral: Negative for behavioral problems.  All other systems reviewed and are negative.    Allergies  Amoxicillin; Ciprofloxacin; Clindamycin/lincomycin; Codeine; Levofloxacin; Morphine and related; and Vasotec  Home Medications   Current Outpatient Rx  Name Route Sig Dispense Refill    . ASPIRIN 81 MG PO TABS Oral Take 81 mg by mouth every evening.     [name] Kitchen CALCIUM ACETATE 667 MG PO CAPS Oral Take 1,334 mg by mouth 3 (three) times daily with meals.    . CYCLOSPORINE MODIFIED (NEORAL) 100 MG PO CAPS Oral Take 100 mg by mouth 2 (two) times daily. Neoral brand    . DOXYCYCLINE HYCLATE 100 MG PO TABS Oral Take 100 mg by mouth 2 (two) times daily.    . INSULIN PUMP Subcutaneous Inject 1 each into the skin continuous. Basal rate is 1.2-1.9 units. Varies with time of day/per patient's device. novolog    . LABETALOL HCL 100 MG PO TABS Oral Take 100 mg by mouth at bedtime.    [name] Kitchen LEVOTHYROXINE SODIUM 75 MCG PO TABS Oral Take 75 mg by mouth every evening.     [name] Kitchen METOCLOPRAMIDE HCL 5 MG PO TABS Oral Take 5 mg by mouth 4 (four) times daily - after meals and at bedtime.    [name] Kitchen PREDNISONE 5 MG PO TABS Oral Take 5 mg by mouth Daily.    [name] Kitchen PROMETHAZINE HCL 25 MG PO TABS Oral Take 25 mg by mouth Every 6 hours as needed. For nausea    . RANITIDINE HCL 75 MG PO TABS Oral Take 75 mg by mouth daily.    [name] Kitchen ROPINIROLE HCL 1 MG PO TABS Oral Take 1 mg by mouth Every evening. Take at 2000    . HYDROCODONE-ACETAMINOPHEN 5-300 MG PO TABS Oral Take 1 tablet by mouth Every 6 hours as needed. For pain    . NITROGLYCERIN 0.4 MG SL SUBL Sublingual Place 0.4 mg under the tongue every 5 (five) minutes as needed.    [name] Kitchen PROVENTIL HFA 108 (90 BASE) MCG/ACT IN AERS Inhalation Inhale 1 puff into the lungs Every 6 hours as needed. For shortness of breath      BP 155/70  Pulse 89  Temp(Src) 98.6 F (37 C) (Oral)  Resp 15  SpO2 100%  Physical Exam  Constitutional: He is oriented to person, place, and time. He appears well-developed and well-nourished.  HENT:  Head: Normocephalic and atraumatic.  Right Ear: External ear normal.  Left Ear: External ear normal.  Nose: Nose normal.  Mouth/Throat: Oropharynx is clear and moist. No oropharyngeal exudate.  Eyes: Conjunctivae and EOM are normal. Pupils are equal, round, and  reactive to light.  Neck: Normal range of motion. Neck supple.  Cardiovascular: Normal rate, regular rhythm, normal heart sounds and intact distal pulses.  Exam reveals no gallop and no friction rub.   No murmur heard. Pulmonary/Chest: Effort normal and breath sounds normal. No respiratory distress. He has no wheezes.  Abdominal: Soft. Bowel sounds are normal. He exhibits distension (mild to moderate distension). There is tenderness (mild diffuse abd ttp).  Genitourinary: Rectum normal.       Normal rectal exam. No gross blood. Neg for occult blood.  Musculoskeletal: Normal range of motion. He exhibits no edema.  Neurological: He is alert and  oriented to person, place, and time.  Skin: Skin is warm and dry.       Reddish hue to skin over kidney txplt at baseline per wife.   Psychiatric: He has a normal mood and affect. His behavior is normal.    ED Course  Procedures (including critical care time)   Labs Reviewed  OCCULT BLOOD, POC DEVICE  CBC  DIFFERENTIAL  COMPREHENSIVE METABOLIC PANEL  LACTIC ACID, PLASMA   No results found.   No diagnosis found.    MDM  7:43 PM 46 y.o. male on 2L at home w hx kidney txplt and recent RF now on HD pw diffuse abd cramping since yesterday. Pt notes he has not had solid bm since March 29th. Pt recently dc to home after being eval for cholecystitis, did not go to OR. Pt denies fever, cp. Pt AFVSS here, abd diffusely distended w/ no focal ttp. Will get labs, pain control, plain film. No evidence of impaction on rectal exam.    11:36 PM: Pt continues to appear well on exam, abd remains benign. Labs/imaging non-contributory. No elev of wbc or lactate. Pt's pain controlled. I informed him to take his norco 5-325 every 4-6 hrs instead of every 8 for better pain control.  I have discussed the diagnosis/risks/treatment options with the patient and believe the pt to be eligible for discharge home to follow-up with his GI doctor as scheduled in 2 days. We  also discussed returning to the ED immediately if new or worsening sx occur. We discussed the sx which are most concerning (e.g., worsening abd pain, sob) that necessitate immediate return. Any new prescriptions provided to the patient are listed below.  New Prescriptions   No medications on file   Clinical Impression 1. Constipation   2. Abdominal pain         Pamella Pert, MD 09/09/11 [place], MD 09/10/11 0010

## 2011-09-09 NOTE — Discharge Instructions (Signed)

## 2011-09-09 NOTE — Telephone Encounter (Signed)
Pt called in to report he is in pain d/t constipation. He saw Dr Hilarie Fredrickson as an in pt and he did a COLON and recommended Miralax BID. Pt reports the Miralax did not work and he's now trying Lactulose tid. Pt reports his BMs are little squirts and water; nothing solid. Pt asked if he should go to the ER. Explained to pt Dr Hilarie Fredrickson is not here today and the Doctor on call is tied up. D/t his may problems and the fact he is a dialysis ppt, he should go to the ER. Explained sometimes pt get impactions where the stool is blocked and only water comes out around the stool; that may be what's wrong with him. Pt stated understanding.

## 2011-09-09 NOTE — ED Notes (Signed)
Patient took off his insulin pump and gave it to his wife.

## 2011-09-11 NOTE — ED Provider Notes (Signed)
I have personally seen and examined the patient.  I have discussed the plan of care with the resident.  I have reviewed the documentation on PMH/FH/Soc. History.  I have reviewed the documentation of the resident and agree.  Pt well appearing, abdomen soft, doubt acute abd process  Sharyon Cable, MD 09/11/11 [phone]

## 2011-09-12 ENCOUNTER — Encounter: Payer: Self-pay | Admitting: Internal Medicine

## 2011-09-12 ENCOUNTER — Ambulatory Visit: Payer: Medicare Other | Admitting: Internal Medicine

## 2011-09-12 ENCOUNTER — Telehealth: Payer: Self-pay | Admitting: Internal Medicine

## 2011-09-16 ENCOUNTER — Encounter: Payer: Self-pay | Admitting: Vascular Surgery

## 2011-09-18 LAB — AFB CULTURE WITH SMEAR (NOT AT ARMC): Acid Fast Smear: NONE SEEN

## 2011-09-20 ENCOUNTER — Encounter: Payer: Self-pay | Admitting: Vascular Surgery

## 2011-09-20 ENCOUNTER — Ambulatory Visit (INDEPENDENT_AMBULATORY_CARE_PROVIDER_SITE_OTHER): Payer: Medicare Other | Admitting: *Deleted

## 2011-09-20 ENCOUNTER — Ambulatory Visit (INDEPENDENT_AMBULATORY_CARE_PROVIDER_SITE_OTHER): Payer: Medicare Other | Admitting: Vascular Surgery

## 2011-09-20 VITALS — BP 157/70 | HR 86 | Temp 97.9°F | Ht 68.0 in | Wt 253.0 lb

## 2011-09-20 DIAGNOSIS — N186 End stage renal disease: Secondary | ICD-10-CM | POA: Insufficient documentation

## 2011-09-20 DIAGNOSIS — T82898A Other specified complication of vascular prosthetic devices, implants and grafts, initial encounter: Secondary | ICD-10-CM

## 2011-09-20 NOTE — Progress Notes (Signed)
Subjective:     Patient ID: Dale [name], male   DOB: 06-22-1965, 46 y.o.   MRN: [id number]  HPI this 46 year old male patient returns for initial followup regarding his right brachial artery-cephalic vein AV fistula I created 4 weeks ago. 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 He has been having some numbness in his right fourth and fifth fingers in particular. He states the hand is not week and it does not ache and throb or keep him from sleeping   Review of Systems     Objective:   Physical ExamBP 157/70  Pulse 86  Temp(Src) 97.9 F (36.6 C) (Oral)  Ht 5\' 8"  (1.727 m)  Wt 253 lb (114.76 kg)  BMI 38.47 kg/m2  General well-developed well-nourished male in no apparent distress alert and oriented x3 Right upper extremity well-healed antecubital wound with excellent pulse and palpable thrill in upper arm fistula. 1+ radioulnar pulse palpable with fistula open. This improves to 3+ with occlusion of the fistula  Today I ordered a Doppler study to quantitate the amount of steal. Baseline pressure in the right radial artery is 90 systolic and with fistula temporarily occluded it increases to 150. 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 Next option for access would be left thigh which is his last remaining site

## 2011-09-27 ENCOUNTER — Ambulatory Visit: Payer: Medicare Other | Admitting: Vascular Surgery

## 2011-10-04 ENCOUNTER — Encounter: Payer: Self-pay | Admitting: Internal Medicine

## 2011-10-05 ENCOUNTER — Ambulatory Visit (INDEPENDENT_AMBULATORY_CARE_PROVIDER_SITE_OTHER): Payer: Medicare Other | Admitting: Internal Medicine

## 2011-10-05 ENCOUNTER — Encounter: Payer: Self-pay | Admitting: Internal Medicine

## 2011-10-05 VITALS — BP 164/80 | HR 100 | Ht 68.0 in

## 2011-10-05 DIAGNOSIS — K3184 Gastroparesis: Secondary | ICD-10-CM

## 2011-10-05 DIAGNOSIS — K219 Gastro-esophageal reflux disease without esophagitis: Secondary | ICD-10-CM

## 2011-10-05 DIAGNOSIS — R109 Unspecified abdominal pain: Secondary | ICD-10-CM

## 2011-10-05 DIAGNOSIS — K5900 Constipation, unspecified: Secondary | ICD-10-CM

## 2011-10-05 DIAGNOSIS — K559 Vascular disorder of intestine, unspecified: Secondary | ICD-10-CM

## 2011-10-05 DIAGNOSIS — R110 Nausea: Secondary | ICD-10-CM

## 2011-10-05 MED ORDER — DOCUSATE SODIUM 100 MG PO CAPS: 200.0000 mg | ORAL_CAPSULE | Freq: Two times a day (BID) | ORAL | Status: DC

## 2011-10-05 MED ORDER — RANITIDINE HCL 150 MG PO TABS: 150.0000 mg | ORAL_TABLET | Freq: Two times a day (BID) | ORAL | Status: DC

## 2011-10-05 NOTE — Progress Notes (Signed)
Patient ID: Dale [name], male   DOB: 13-Oct-1965, 46 y.o.   MRN: [id number]  SUBJECTIVE: HPI [name] is a 46 yo male with PMH of end-stage renal disease, status post cataract renal transplant 1995, with recent recurrent renal failure requiring reinstitution of dialysis, hypertension, diabetes, who seen in hospital followup. [name] was hospitalized in May of 2013, at which time he was getting restarted on dialysis, but was also having lower abdominal pain and constipation. He was seen by the GI consult service, and underwent colonoscopy with biopsy on 08/29/2011. The colonoscopy was notable for mild diverticulosis in the left colon, and abnormal appearing mucosa throughout the colon, specifically pale and purplish discoloration to the colonic mucosa. Biopsies were obtained, and pathology revealed changes consistent with ischemia. It was felt that his ischemia was likely related to relative hypotension with reinstitution of dialysis. He was having considerable abdominal pain during this hospitalization, as well as constipation. He was started on a laxative regimen and he was using narcotic pain medications for pain control.  Today he reports that he is doing well. He is no longer having such severe constipation. He does note his stools continue to be hard, but he is having one to 2 bowel movements daily. His lower abdominal pain is much improved. He is having some mid to lower back pain, but this is not necessarily new for him. This pain is better with movement and stretching. He is on dialysis Tuesday Thursday and Saturday, and has been achieving his dry weight. He has lost about 45 pounds of fluid since March of 2013. For his diarrhea he is taking Colace 100 mg twice daily. He was given a prescription for lactulose, but he has not needed to use this. He does report that he is having heartburn symptoms on multiple days per week. He is not having dysphagia or odynophagia. He is having some nausea, this is worse  after taking his morning medications. No vomiting. He remains on Reglan 5 mg 4 times a day, which she's been on for 2-3 years. He reports having started this for gastroparesis associated with his diabetes mellitus.  Review of Systems  As per history of present illness, otherwise negative   Past Medical History  Diagnosis Date  . Insulin dependent diabetes mellitus   . Hypertension   . History of blood transfusion   . ESRD (end stage renal disease)     a. 1995 s/p cadaveric transplant w/ susbequent failure after 18 yrs;  b.Dialysis initiated 07/2011  . Dyspnea     a. 07/23/11 Echo: EF 55-60%  . Anemia of chronic disease   . Cholecystitis     a. 08/27/2011  . Constipation   . Pericardial effusion     a.  Small by CT 08/22/11;  b.  Large by CT 08/27/11  . Inguinal lymphadenopathy     a. bilateral - s/p biopsy 07/2011    Current Outpatient Prescriptions  Medication Sig Dispense Refill  . aspirin 81 MG tablet Take 81 mg by mouth every evening.       . calcium acetate (PHOSLO) 667 MG capsule Take 1,334 mg by mouth 3 (three) times daily with meals.      . cycloSPORINE modified (NEORAL/GENGRAF) 100 MG capsule Take 100 mg by mouth 2 (two) times daily. Neoral brand      . Hydrocodone-Acetaminophen 5-300 MG TABS Take 1 tablet by mouth Every 6 hours as needed. For pain      . Insulin Human (INSULIN PUMP) 100  unit/ml SOLN Inject 1 each into the skin continuous. Basal rate is 1.2-1.9 units. Varies with time of day/per patient's device. novolog      . labetalol (NORMODYNE) 100 MG tablet Take 100 mg by mouth at bedtime.      [name] Kitchen levothyroxine (SYNTHROID, LEVOTHROID) 75 MCG tablet Take 75 mg by mouth every evening.       . metoCLOPramide (REGLAN) 5 MG tablet Take 5 mg by mouth 4 (four) times daily - after meals and at bedtime.      . nitroGLYCERIN (NITROSTAT) 0.4 MG SL tablet Place 0.4 mg under the tongue every 5 (five) minutes as needed.      . predniSONE (DELTASONE) 5 MG tablet Take 5 mg by mouth Daily.       . promethazine (PHENERGAN) 25 MG tablet Take 25 mg by mouth Every 6 hours as needed. For nausea      . PROVENTIL HFA 108 (90 BASE) MCG/ACT inhaler Inhale 1 puff into the lungs Every 6 hours as needed. For shortness of breath      . rOPINIRole (REQUIP) 1 MG tablet Take 1 mg by mouth Every evening. Take at 2000      . DISCONTD: ranitidine (ZANTAC) 75 MG tablet Take 75 mg by mouth daily.      [name] Kitchen docusate sodium (COLACE) 100 MG capsule Take 2 capsules (200 mg total) by mouth 2 (two) times daily.  30 capsule  1  . ranitidine (ZANTAC) 150 MG tablet Take 1 tablet (150 mg total) by mouth 2 (two) times daily.  60 tablet  3    Allergies  Allergen Reactions  . Amoxicillin Other (See Comments)    Reaction unknown  . Ciprofloxacin Other (See Comments)    Reaction unknown  . Clindamycin/Lincomycin Other (See Comments)    Reaction unknown  . Codeine Other (See Comments)    Reaction unknown. But pt has tolerated hydrocodone in Vicodin and Norco in the past  . Levofloxacin Other (See Comments)    Reaction unknown  . Morphine And Related     nausea  . Vasotec Cough    Family History  Problem Relation Age of Onset  . Malignant hyperthermia Neg Hx   . Colon polyps Father   . Diabetes Father   . Breast cancer Maternal Aunt     History  Substance Use Topics  . Smoking status: Never Smoker   . Smokeless tobacco: Never Used  . Alcohol Use: No     occasionally 1xmonth    OBJECTIVE: BP 164/80  Pulse 100  Ht 5\' 8"  (1.727 m) Constitutional: Chronically ill-appearing male, in a wheelchair, in no acute distress HEENT: Normocephalic and atraumatic. Oropharynx is clear and moist. No oropharyngeal exudate. Conjunctivae are normal.  No scleral icterus. Neck: Neck supple. Trachea midline. Cardiovascular: Normal rate, regular rhythm and intact distal pulses. No M/R/G Pulmonary/chest: Effort normal and breath sounds normal. No wheezing, rales or rhonchi. Abdominal: Soft, obese, nontender,  nondistended. Bowel sounds active throughout. Extremities: Status post left BKA, right lower extremity with changes of chronic venous stasis without significant edema Neurological: Alert and oriented to person place and time. Skin: Skin is warm and dry. No rashes noted. Psychiatric: Normal mood and affect. Behavior is normal.  Imaging and pathology results --  1. Colon, biopsy, Right ascending - FINDINGS CONSISTENT WITH ISCHEMIA. SEE MICROSCOPIC DESCRIPTION. 2. Colon, biopsy, Left - BENIGN COLONIC MUCOSA. NO FEATURES OF ISCHEMIA, MICROSCOPIC COLITIS, ACTIVE INFLAMMATION OR GRANULOMAS. Microscopic Comment 1. There are multiple fragments of colonic  mucosa which focally show fibrosis within the lamina propria with crypt atrophy. There is also a focus with mild active inflammation and the findings are consistent with the clinical impression of ischemia. No granuloma are identified. 2. There are multiple fragments of benign colonic mucosa with normal crypt architecture and the usual amount of infiltrate in the lamina propria. No active inflammation, granulomas, or ischemic changes are identified.  CT ABDOMEN AND PELVIS WITH CONTRAST   Technique:  Multidetector CT imaging of the abdomen and pelvis was performed following the standard protocol during bolus administration of intravenous contrast.   Contrast: 129mL OMNIPAQUE IOHEXOL 300 MG/ML  SOLN   Comparison: CT scan 08/27/2011.   Findings: Examination is limited by a body habitus.  The patient is touching the gantry which is creating significant artifact.   The lung bases demonstrates small bilateral pleural effusions and bibasilar atelectasis.  A stable small to moderate-sized pericardial effusion is noted.   The liver is unremarkable and stable.  No focal lesions or biliary dilatation.  The gallbladder is mildly contracted.  I do not see any inflammation was present on the prior CT scan.  The pancreas is unremarkable and stable.   Mild pancreatic atrophy for age.  The spleen is normal in size.  No focal lesions.  The adrenal glands are normal and stable.  The kidneys are very small and demonstrate significant atrophy.  Renal artery calcifications are noted.   The stomach, duodenum, small bowel and colon are grossly normal. No inflammatory changes or mass lesions.  No obstruction.   Stable enlarged mesenteric and retroperitoneal lymph nodes.  The aorta is normal in caliber.  Stable atherosclerotic changes.   The transplanted kidney in the left pelvis appears normal.  Normal perfusion.  No obstruction.  The bladder is normal.  The seminal vesicles and prostate gland are unremarkable.  Vas deferens calcifications are noted.  No pelvic mass, adenopathy or free pelvic fluid collections.  Stable borderline enlarged pelvic lymph nodes.  Stable enlarged inguinal lymph nodes.  There is a persistent left inguinal/groin fluid collection which is likely a liquefied hematoma.   IMPRESSION:   1.  Persistent small bilateral pleural effusions, left greater than right with overlying atelectasis. 2.  Persistent small to moderate-sized pericardial effusion. 3.  Stable mesenteric, retroperitoneal, pelvic and inguinal adenopathy. 4.  No acute intra abdominal findings. 5.  Persistent fluid collection in the left inguinal/groin area.    ASSESSMENT AND PLAN: 46 yo male with PMH of end-stage renal disease, status post cataract renal transplant 1995, with recent recurrent renal failure requiring reinstitution of dialysis, hypertension, diabetes, who seen in hospital followup.   1. Constipation/abd pain -- the patient's constipation has improved on Colace 100 mg twice daily. His stools are still hard, but he is having them twice daily, and he is not having to strain at stool. His abdominal pain has improved significantly.  It is felt that the ischemia seen on colon biopsies, was likely due to low-flow state and relative hypotension as  he was resuming hemodialysis. At present, we will increase the Colace to 200 mg twice daily. He can use lactulose on an as-needed basis only should constipation develop. We have increased his Colace, because his stools remain somewhat hard. I've asked that he let us know if his constipation or abdominal pain worsens. He voices understanding  2. GERD/nausea -- he is currently taking ranitidine 75 mg in the morning only. He notes that his GERD symptoms, specifically heartburn, or worsen the evenings and  on waking. It is likely due to the fact that he is uncovered from acid standpoint in the evening and early in the morning, and given the 10-12 hour duration of ranitidine's effect. I will increase his ranitidine to 150 mg every 12 hours. This is an attempt to get better control of his GERD, which may in fact improve his morning nausea. He will let us know if this isn't helping.  3. Gastroparesis -- the best I can tell, this was a clinical diagnosis. He has been on metoclopramide for several years. We discussed this medication a day, specifically the possible long-term neurologic side effects which can develop. We discussed tardive dyskinesia. It is unclear whether he still needs this medication, but I did feel obligated to make him aware of the possible, though very unlikely, complications.  I discussed how it is best to use the lowest effective dose, perhaps decreasing his frequency to 2 or 3 times daily. And also considering drug holidays. He will try to modify his dosing regimen to see if he can reduce his dose. If he does indeed need this medicine long-term, we can perhaps discussed domperidone.  I will see him back in 3 or 4 months or sooner if needed

## 2011-10-05 NOTE — Patient Instructions (Addendum)
We have sent the following medications to your pharmacy for you to pick up at your convenience: Colace 200 mg twice daily; Zantac 150 mg. Twice daily.   Follow up with Dr. Hilarie Fredrickson in 3-4 months

## 2011-10-06 ENCOUNTER — Encounter: Payer: Self-pay | Admitting: Gastroenterology

## 2011-10-12 NOTE — Telephone Encounter (Signed)
No charge. 

## 2011-11-16 ENCOUNTER — Other Ambulatory Visit (HOSPITAL_COMMUNITY): Payer: Self-pay | Admitting: *Deleted

## 2011-11-21 ENCOUNTER — Encounter (HOSPITAL_COMMUNITY)
Admission: RE | Admit: 2011-11-21 | Discharge: 2011-11-21 | Disposition: A | Discharge status: Home / Self Care | Payer: Medicare Other | Source: Ambulatory Visit | Attending: Nephrology | Admitting: Nephrology

## 2011-11-21 DIAGNOSIS — D638 Anemia in other chronic diseases classified elsewhere: Secondary | ICD-10-CM | POA: Insufficient documentation

## 2011-11-21 DIAGNOSIS — N184 Chronic kidney disease, stage 4 (severe): Secondary | ICD-10-CM | POA: Insufficient documentation

## 2011-11-21 LAB — POCT HEMOGLOBIN-HEMACUE: Hemoglobin: 11.6 g/dL — ABNORMAL LOW (ref 13.0–17.0)

## 2011-11-21 MED ORDER — EPOETIN ALFA 40000 UNIT/ML IJ SOLN
INTRAMUSCULAR | Status: AC
  Administered 2011-11-21: 40000 [IU] via SUBCUTANEOUS
  Filled 2011-11-21: qty 1

## 2011-11-21 MED ORDER — EPOETIN ALFA 40000 UNIT/ML IJ SOLN
40000.0000 [IU] | INTRAMUSCULAR | Status: DC
  Administered 2011-11-21: 40000 [IU] via SUBCUTANEOUS

## 2011-11-23 ENCOUNTER — Other Ambulatory Visit: Payer: Self-pay | Admitting: *Deleted

## 2011-11-27 ENCOUNTER — Other Ambulatory Visit: Payer: Self-pay | Admitting: Internal Medicine

## 2011-11-28 ENCOUNTER — Encounter (HOSPITAL_COMMUNITY)
Admission: RE | Admit: 2011-11-28 | Discharge: 2011-11-28 | Disposition: A | Discharge status: Home / Self Care | Payer: Medicare Other | Source: Ambulatory Visit | Attending: Vascular Surgery | Admitting: Vascular Surgery

## 2011-11-28 ENCOUNTER — Encounter: Payer: Self-pay | Admitting: Physician Assistant

## 2011-11-28 DIAGNOSIS — N186 End stage renal disease: Secondary | ICD-10-CM

## 2011-11-28 DIAGNOSIS — D638 Anemia in other chronic diseases classified elsewhere: Secondary | ICD-10-CM | POA: Insufficient documentation

## 2011-11-28 DIAGNOSIS — N184 Chronic kidney disease, stage 4 (severe): Secondary | ICD-10-CM | POA: Insufficient documentation

## 2011-11-28 NOTE — Progress Notes (Unsigned)
Patient ID: Dale [name], male   DOB: 1965-09-30, 46 y.o.   MRN: [id number]   [facility] Catheter Removal Procedure Note  Diagnosis: ESRD with Functioning AVF/AVGG  Plan:  Remove right diatek catheter  Consent signed:  yes Time out completed:  yes Coumadin:  no PT/INR (if applicable):   Other labs:   Procedure: 1.  Sterile prepping and draping over catheter area 2. 0 ml 2% lidocaine plain instilled at removal site. 3.  right catheter removed in its entirety with cuff in tact. 4.  Complications: none  5. Tip of catheter sent for culture:  no   Patient tolerated procedure well:  yes Pressure held, no bleeding noted, dressing applied Instructions given to the pt regarding wound care and bleeding.  Other:  Kristopher Attwood MAUREEN 11/28/2011 1:15 PM    [facility] SHORT STAY H&P  CC:  Catheter removal   HPI:  ***  Past Medical History  Diagnosis Date  . Insulin dependent diabetes mellitus   . Hypertension   . History of blood transfusion   . ESRD (end stage renal disease)     a. 1995 s/p cadaveric transplant w/ susbequent failure after 18 yrs;  b.Dialysis initiated 07/2011  . Dyspnea     a. 07/23/11 Echo: EF 55-60%  . Anemia of chronic disease   . Cholecystitis     a. 08/27/2011  . Constipation   . Pericardial effusion     a.  Small by CT 08/22/11;  b.  Large by CT 08/27/11  . Inguinal lymphadenopathy     a. bilateral - s/p biopsy 07/2011    FH:  Non-Contributory  History   Social History  . Marital Status: Married    Spouse Name: N/A    Number of Children: N/A  . Years of Education: N/A   Occupational History  . Disabled    Social History Main Topics  . Smoking status: Never Smoker   . Smokeless tobacco: Never Used  . Alcohol Use: No     occasionally 1xmonth  . Drug Use: No  . Sexually Active: Not on file   Other Topics Concern  . Not on file   Social History Narrative   Lives @ home with his wife in [place].      Allergies  Allergen Reactions  . Amoxicillin Other (See Comments)    Reaction unknown  . Ciprofloxacin Other (See Comments)    Reaction unknown  . Clindamycin/Lincomycin Other (See Comments)    Reaction unknown  . Codeine Other (See Comments)    Reaction unknown. But pt has tolerated hydrocodone in Vicodin and Norco in the past  . Levofloxacin Other (See Comments)    Reaction unknown  . Morphine And Related     nausea  . Vasotec Cough  . Oxycodone Rash    Current Outpatient Prescriptions  Medication Sig Dispense Refill  . aspirin 81 MG tablet Take 81 mg by mouth every evening.       . calcium acetate (PHOSLO) 667 MG capsule Take 1,334 mg by mouth 3 (three) times daily with meals.      . [employer] STOOL SOFTENER 100 MG capsule TAKE 2 CAPSULES TWICE A DAY  120 capsule  1  . cycloSPORINE modified (NEORAL/GENGRAF) 100 MG capsule Take 100 mg by mouth 2 (two) times daily. Neoral brand      . Hydrocodone-Acetaminophen 5-300 MG TABS Take 1 tablet by mouth Every 6 hours as needed. For pain      .  Insulin Human (INSULIN PUMP) 100 unit/ml SOLN Inject 1 each into the skin continuous. Basal rate is 1.2-1.9 units. Varies with time of day/per patient's device. novolog      . labetalol (NORMODYNE) 100 MG tablet Take 100 mg by mouth at bedtime.      [name] Kitchen levothyroxine (SYNTHROID, LEVOTHROID) 75 MCG tablet Take 75 mg by mouth every evening.       . metoCLOPramide (REGLAN) 5 MG tablet Take 5 mg by mouth 4 (four) times daily - after meals and at bedtime.      . nitroGLYCERIN (NITROSTAT) 0.4 MG SL tablet Place 0.4 mg under the tongue every 5 (five) minutes as needed.      . predniSONE (DELTASONE) 5 MG tablet Take 5 mg by mouth Daily.      . promethazine (PHENERGAN) 25 MG tablet Take 25 mg by mouth Every 6 hours as needed. For nausea      . PROVENTIL HFA 108 (90 BASE) MCG/ACT inhaler Inhale 1 puff into the lungs Every 6 hours as needed. For shortness of breath      . ranitidine (ZANTAC) 150 MG tablet Take 1  tablet (150 mg total) by mouth 2 (two) times daily.  60 tablet  3  . rOPINIRole (REQUIP) 1 MG tablet Take 1 mg by mouth Every evening. Take at 2000        ROS:  See HPI  PHYSICAL EXAM  There were no vitals filed for this visit.  Gen:  normocephalic Right upper arm AV fistula with Good thrill palpated. Radial pulses palpated. No sign of steele.   Lab/X-ray:  Impression: This is a 46 y.o. male here for diatek catheter removal  Plan:  Removal of right diatek catheter  Gerri Lins, PA-C [facility] [phone] 11/28/2011 1:15 PM    11/28/2011 Dale [name] [id number] 25-Sep-1965

## 2011-11-28 NOTE — Progress Notes (Signed)
Right upper chest dressing clean, dry, intact.

## 2011-12-05 ENCOUNTER — Encounter (HOSPITAL_COMMUNITY)
Admission: RE | Admit: 2011-12-05 | Discharge: 2011-12-05 | Disposition: A | Discharge status: Home / Self Care | Payer: Medicare Other | Source: Ambulatory Visit | Attending: Nephrology | Admitting: Nephrology

## 2011-12-05 LAB — IRON AND TIBC
Iron: 73 ug/dL (ref 42–135)
Saturation Ratios: 46 % (ref 20–55)
TIBC: 160 ug/dL — ABNORMAL LOW (ref 215–435)
UIBC: 87 ug/dL — ABNORMAL LOW (ref 125–400)

## 2011-12-05 LAB — FERRITIN: Ferritin: 380 ng/mL — ABNORMAL HIGH (ref 22–322)

## 2011-12-05 LAB — POCT HEMOGLOBIN-HEMACUE: Hemoglobin: 12.4 g/dL — ABNORMAL LOW (ref 13.0–17.0)

## 2011-12-05 MED ORDER — EPOETIN ALFA 40000 UNIT/ML IJ SOLN: 40000.0000 [IU] | INTRAMUSCULAR | Status: DC

## 2011-12-13 ENCOUNTER — Encounter (HOSPITAL_COMMUNITY): Payer: Medicare Other

## 2011-12-20 ENCOUNTER — Encounter (HOSPITAL_COMMUNITY)
Admission: RE | Admit: 2011-12-20 | Discharge: 2011-12-20 | Disposition: A | Discharge status: Home / Self Care | Payer: Medicare Other | Source: Ambulatory Visit | Attending: Nephrology | Admitting: Nephrology

## 2011-12-20 LAB — POCT HEMOGLOBIN-HEMACUE: Hemoglobin: 11.7 g/dL — ABNORMAL LOW (ref 13.0–17.0)

## 2011-12-20 MED ORDER — EPOETIN ALFA 40000 UNIT/ML IJ SOLN
40000.0000 [IU] | INTRAMUSCULAR | Status: DC
  Administered 2011-12-20: 40000 [IU] via SUBCUTANEOUS
  Filled 2011-12-20: qty 1

## 2011-12-21 LAB — POCT HEMOGLOBIN-HEMACUE: Hemoglobin: 8.8 g/dL — ABNORMAL LOW (ref 13.0–17.0)

## 2011-12-21 NOTE — ED Provider Notes (Signed)
Medical screening examination/treatment/procedure(s) were performed by non-physician practitioner and as supervising physician I was immediately available for consultation/collaboration.    Johnna Acosta, MD 12/21/11 [phone]

## 2012-01-03 ENCOUNTER — Encounter (HOSPITAL_COMMUNITY)
Admission: RE | Admit: 2012-01-03 | Discharge: 2012-01-03 | Disposition: A | Discharge status: Home / Self Care | Payer: Medicare Other | Source: Ambulatory Visit | Attending: Vascular Surgery | Admitting: Vascular Surgery

## 2012-01-03 DIAGNOSIS — D638 Anemia in other chronic diseases classified elsewhere: Secondary | ICD-10-CM | POA: Insufficient documentation

## 2012-01-03 DIAGNOSIS — N184 Chronic kidney disease, stage 4 (severe): Secondary | ICD-10-CM | POA: Insufficient documentation

## 2012-01-03 LAB — POCT HEMOGLOBIN-HEMACUE: Hemoglobin: 12.5 g/dL — ABNORMAL LOW (ref 13.0–17.0)

## 2012-01-03 MED ORDER — EPOETIN ALFA 40000 UNIT/ML IJ SOLN: 40000.0000 [IU] | INTRAMUSCULAR | Status: DC

## 2012-01-17 ENCOUNTER — Encounter (HOSPITAL_COMMUNITY): Payer: Medicare Other

## 2012-01-20 ENCOUNTER — Encounter (HOSPITAL_COMMUNITY)
Admission: RE | Admit: 2012-01-20 | Discharge: 2012-01-20 | Disposition: A | Discharge status: Home / Self Care | Payer: Medicare Other | Source: Ambulatory Visit | Attending: Nephrology | Admitting: Nephrology

## 2012-01-20 LAB — POCT HEMOGLOBIN-HEMACUE: Hemoglobin: 11.3 g/dL — ABNORMAL LOW (ref 13.0–17.0)

## 2012-01-20 MED ORDER — EPOETIN ALFA 40000 UNIT/ML IJ SOLN
40000.0000 [IU] | INTRAMUSCULAR | Status: DC
  Administered 2012-01-20: 40000 [IU] via SUBCUTANEOUS

## 2012-01-20 MED ORDER — EPOETIN ALFA 40000 UNIT/ML IJ SOLN
INTRAMUSCULAR | Status: AC
  Filled 2012-01-20: qty 1

## 2012-01-30 ENCOUNTER — Other Ambulatory Visit (HOSPITAL_COMMUNITY): Payer: Self-pay | Admitting: Nephrology

## 2012-01-30 DIAGNOSIS — I509 Heart failure, unspecified: Secondary | ICD-10-CM

## 2012-01-30 DIAGNOSIS — I871 Compression of vein: Secondary | ICD-10-CM

## 2012-01-30 DIAGNOSIS — E039 Hypothyroidism, unspecified: Secondary | ICD-10-CM

## 2012-01-30 DIAGNOSIS — M109 Gout, unspecified: Secondary | ICD-10-CM

## 2012-01-30 DIAGNOSIS — I739 Peripheral vascular disease, unspecified: Secondary | ICD-10-CM

## 2012-01-30 DIAGNOSIS — N186 End stage renal disease: Secondary | ICD-10-CM

## 2012-02-02 ENCOUNTER — Encounter (HOSPITAL_COMMUNITY): Payer: Self-pay

## 2012-02-02 ENCOUNTER — Encounter (HOSPITAL_COMMUNITY)
Admission: RE | Admit: 2012-02-02 | Discharge: 2012-02-02 | Disposition: A | Discharge status: Home / Self Care | Payer: Medicare Other | Source: Ambulatory Visit | Attending: Vascular Surgery | Admitting: Vascular Surgery

## 2012-02-02 ENCOUNTER — Ambulatory Visit (HOSPITAL_COMMUNITY)
Admission: RE | Admit: 2012-02-02 | Discharge: 2012-02-02 | Disposition: A | Discharge status: Home / Self Care | Payer: Medicare Other | Source: Ambulatory Visit | Attending: Nephrology | Admitting: Nephrology

## 2012-02-02 DIAGNOSIS — Z794 Long term (current) use of insulin: Secondary | ICD-10-CM | POA: Insufficient documentation

## 2012-02-02 DIAGNOSIS — Z940 Kidney transplant status: Secondary | ICD-10-CM | POA: Insufficient documentation

## 2012-02-02 DIAGNOSIS — E119 Type 2 diabetes mellitus without complications: Secondary | ICD-10-CM | POA: Insufficient documentation

## 2012-02-02 DIAGNOSIS — D638 Anemia in other chronic diseases classified elsewhere: Secondary | ICD-10-CM | POA: Insufficient documentation

## 2012-02-02 DIAGNOSIS — I739 Peripheral vascular disease, unspecified: Secondary | ICD-10-CM

## 2012-02-02 DIAGNOSIS — N184 Chronic kidney disease, stage 4 (severe): Secondary | ICD-10-CM | POA: Insufficient documentation

## 2012-02-02 DIAGNOSIS — I120 Hypertensive chronic kidney disease with stage 5 chronic kidney disease or end stage renal disease: Secondary | ICD-10-CM | POA: Insufficient documentation

## 2012-02-02 DIAGNOSIS — E039 Hypothyroidism, unspecified: Secondary | ICD-10-CM

## 2012-02-02 DIAGNOSIS — M7989 Other specified soft tissue disorders: Secondary | ICD-10-CM | POA: Insufficient documentation

## 2012-02-02 DIAGNOSIS — I509 Heart failure, unspecified: Secondary | ICD-10-CM

## 2012-02-02 DIAGNOSIS — I871 Compression of vein: Secondary | ICD-10-CM

## 2012-02-02 DIAGNOSIS — N186 End stage renal disease: Secondary | ICD-10-CM | POA: Insufficient documentation

## 2012-02-02 DIAGNOSIS — M109 Gout, unspecified: Secondary | ICD-10-CM

## 2012-02-02 DIAGNOSIS — Z992 Dependence on renal dialysis: Secondary | ICD-10-CM | POA: Insufficient documentation

## 2012-02-02 LAB — GLUCOSE, CAPILLARY
Glucose-Capillary: 160 mg/dL — ABNORMAL HIGH (ref 70–99)
Glucose-Capillary: 116 mg/dL — ABNORMAL HIGH (ref 70–99)
Glucose-Capillary: 98 mg/dL (ref 70–99)

## 2012-02-02 LAB — POCT HEMOGLOBIN-HEMACUE: Hemoglobin: 11.1 g/dL — ABNORMAL LOW (ref 13.0–17.0)

## 2012-02-02 MED ORDER — FENTANYL CITRATE 0.05 MG/ML IJ SOLN
INTRAMUSCULAR | Status: AC | PRN
  Administered 2012-02-02: 50 ug via INTRAVENOUS

## 2012-02-02 MED ORDER — MIDAZOLAM HCL 5 MG/5ML IJ SOLN
INTRAMUSCULAR | Status: AC | PRN
  Administered 2012-02-02: 1 mg via INTRAVENOUS

## 2012-02-02 MED ORDER — EPOETIN ALFA 40000 UNIT/ML IJ SOLN
40000.0000 [IU] | INTRAMUSCULAR | Status: DC
  Administered 2012-02-02: 40000 [IU] via SUBCUTANEOUS

## 2012-02-02 MED ORDER — MIDAZOLAM HCL 2 MG/2ML IJ SOLN
INTRAMUSCULAR | Status: AC
  Filled 2012-02-02: qty 4

## 2012-02-02 MED ORDER — EPOETIN ALFA 40000 UNIT/ML IJ SOLN
INTRAMUSCULAR | Status: AC
  Administered 2012-02-02: 40000 [IU] via SUBCUTANEOUS
  Filled 2012-02-02: qty 1

## 2012-02-02 MED ORDER — SODIUM CHLORIDE 0.9 % IV SOLN: Freq: Once | INTRAVENOUS | Status: DC

## 2012-02-02 MED ORDER — FENTANYL CITRATE 0.05 MG/ML IJ SOLN
INTRAMUSCULAR | Status: AC
  Filled 2012-02-02: qty 4

## 2012-02-02 NOTE — Procedures (Signed)
R fistulagram : no stenosis No complication No blood loss. See complete dictation in [facility].

## 2012-02-02 NOTE — H&P (Signed)
Dale [name] is an 46 y.o. male.   Chief Complaint: Rt arm swelling x 6 months Hx diabetes; renal failure 1991; dialysis x 3 yrs and had renal transplant 04/25/93 Did well for 18 yrs when had acute renal failure event 07/2011 and had rt arm fistula placed Dialyzed for 6 weeks and has done well without dialysis since then Rt arm swelling has been noticeable since 4/13 but has recently progressed slightly Scheduled today for CO2 fistulogram and possible angioplasty/stent HPI: DM; HTN; ESRD; renal tx; anemia  Past Medical History  Diagnosis Date  . Insulin dependent diabetes mellitus   . Hypertension   . History of blood transfusion   . ESRD (end stage renal disease)     a. 1995 s/p cadaveric transplant w/ susbequent failure after 18 yrs;  b.Dialysis initiated 07/2011  . Dyspnea     a. 07/23/11 Echo: EF 55-60%  . Anemia of chronic disease   . Cholecystitis     a. 08/27/2011  . Constipation   . Pericardial effusion     a.  Small by CT 08/22/11;  b.  Large by CT 08/27/11  . Inguinal lymphadenopathy     a. bilateral - s/p biopsy 07/2011    Past Surgical History  Procedure Date  . Appendectomy   . Hernia repair   . Kidney transplant   . Cataract extraction   . Arteriovenous graft placement   . Av fistula placement   . Toe amputation   . Below knee leg amputation   . Carpal tunnel release   . Shoulder surgery   . Av fistula placement 07/29/2011    Procedure: ARTERIOVENOUS (AV) FISTULA CREATION;  Surgeon: Mal Misty, MD;  Location: [place];  Service: Vascular;  Laterality: Right;  Brachial cephalic  . Insertion of dialysis catheter 08/01/2011    Procedure: INSERTION OF DIALYSIS CATHETER;  Surgeon: Rosetta Posner, MD;  Location: [place];  Service: Vascular;  Laterality: Right;  insertion of dialysis catheter on right internal jugular vein  . Lymph node biopsy 08/10/2011    Procedure: LYMPH NODE BIOPSY;  Surgeon: Merrie Roof, MD;  Location: [place];  Service: General;  Laterality: Left;  left groin  lymph biopsy  . Colonoscopy 08/29/2011    Procedure: COLONOSCOPY;  Surgeon: Jerene Bears, MD;  Location: [place];  Service: Gastroenterology;  Laterality: N/A;    Family History  Problem Relation Age of Onset  . Malignant hyperthermia Neg Hx   . Colon polyps Father   . Diabetes Father   . Breast cancer Maternal Aunt    Social History:  reports that he has never smoked. He has never used smokeless tobacco. He reports that he does not drink alcohol or use illicit drugs.  Allergies:  Allergies  Allergen Reactions  . Amoxicillin Other (See Comments)    Reaction unknown  . Ciprofloxacin Other (See Comments)    Reaction unknown  . Clindamycin/Lincomycin Other (See Comments)    Reaction unknown  . Codeine Other (See Comments)    Reaction unknown. But pt has tolerated hydrocodone in Vicodin and Norco in the past  . Levofloxacin Other (See Comments)    Reaction unknown  . Morphine And Related     nausea  . Vasotec Cough  . Oxycodone Rash     (Not in a hospital admission)  No results found for this or any previous visit (from the past 48 hour(s)). No results found.  Review of Systems  Constitutional: Negative for fever.  Respiratory:  Negative for shortness of breath.   Cardiovascular: Negative for chest pain.  Gastrointestinal: Negative for nausea, vomiting and abdominal pain.  Musculoskeletal: Negative for back pain.  Neurological: Negative for headaches.    There were no vitals taken for this visit. Physical Exam  Constitutional: He is oriented to person, place, and time. He appears well-developed and well-nourished.  Cardiovascular: Normal rate, regular rhythm and normal heart sounds.   No murmur heard. Respiratory: Effort normal and breath sounds normal. He has no wheezes.  GI: Soft. Bowel sounds are normal. There is no tenderness.  Musculoskeletal: Normal range of motion.       Rt arm fistula has good pulse and thrill  Neurological: He is alert and oriented to  person, place, and time.  Skin: Skin is warm and dry.  Psychiatric: He has a normal mood and affect. His behavior is normal. Judgment and thought content normal.     Assessment/Plan Rt arm swelling after fistula placement 4/13 progressive recently Scheduled for CO2 rt fistulogram with poss pta/stent placement Pt aware of procedure benefits and risks and agreeable to proceed Consent signed and in chart  Remy A 02/02/2012, 7:27 AM

## 2012-02-03 ENCOUNTER — Telehealth (HOSPITAL_COMMUNITY): Payer: Self-pay | Admitting: *Deleted

## 2012-02-03 ENCOUNTER — Encounter (HOSPITAL_COMMUNITY): Payer: Medicare Other

## 2012-02-17 ENCOUNTER — Encounter (HOSPITAL_COMMUNITY): Payer: Medicare Other

## 2012-02-20 ENCOUNTER — Encounter (HOSPITAL_COMMUNITY)
Admission: RE | Admit: 2012-02-20 | Discharge: 2012-02-20 | Disposition: A | Discharge status: Home / Self Care | Payer: Medicare Other | Source: Ambulatory Visit | Attending: Nephrology | Admitting: Nephrology

## 2012-02-20 LAB — POCT HEMOGLOBIN-HEMACUE: Hemoglobin: 12.3 g/dL — ABNORMAL LOW (ref 13.0–17.0)

## 2012-02-20 MED ORDER — EPOETIN ALFA 40000 UNIT/ML IJ SOLN: 40000.0000 [IU] | INTRAMUSCULAR | Status: DC

## 2012-03-09 ENCOUNTER — Other Ambulatory Visit (HOSPITAL_COMMUNITY): Payer: Self-pay | Admitting: *Deleted

## 2012-03-12 ENCOUNTER — Encounter (HOSPITAL_COMMUNITY)
Admission: RE | Admit: 2012-03-12 | Discharge: 2012-03-12 | Disposition: A | Discharge status: Home / Self Care | Payer: Medicare Other | Source: Ambulatory Visit | Attending: Vascular Surgery | Admitting: Vascular Surgery

## 2012-03-12 DIAGNOSIS — N184 Chronic kidney disease, stage 4 (severe): Secondary | ICD-10-CM | POA: Insufficient documentation

## 2012-03-12 DIAGNOSIS — D638 Anemia in other chronic diseases classified elsewhere: Secondary | ICD-10-CM | POA: Insufficient documentation

## 2012-03-12 LAB — FERRITIN: Ferritin: 470 ng/mL — ABNORMAL HIGH (ref 22–322)

## 2012-03-12 LAB — IRON AND TIBC
Iron: 71 ug/dL (ref 42–135)
Saturation Ratios: 44 % (ref 20–55)
TIBC: 162 ug/dL — ABNORMAL LOW (ref 215–435)
UIBC: 91 ug/dL — ABNORMAL LOW (ref 125–400)

## 2012-03-12 LAB — POCT HEMOGLOBIN-HEMACUE: Hemoglobin: 11.8 g/dL — ABNORMAL LOW (ref 13.0–17.0)

## 2012-03-12 MED ORDER — EPOETIN ALFA 40000 UNIT/ML IJ SOLN
40000.0000 [IU] | INTRAMUSCULAR | Status: DC
  Administered 2012-03-12: 40000 [IU] via SUBCUTANEOUS

## 2012-03-12 MED ORDER — EPOETIN ALFA 40000 UNIT/ML IJ SOLN
INTRAMUSCULAR | Status: AC
  Administered 2012-03-12: 40000 [IU] via SUBCUTANEOUS
  Filled 2012-03-12: qty 1

## 2012-03-14 ENCOUNTER — Encounter: Payer: Self-pay | Admitting: Neurosurgery

## 2012-03-15 ENCOUNTER — Ambulatory Visit: Payer: Medicare Other | Admitting: Neurosurgery

## 2012-03-16 ENCOUNTER — Encounter: Payer: Self-pay | Admitting: Neurosurgery

## 2012-03-19 ENCOUNTER — Encounter: Payer: Self-pay | Admitting: Neurosurgery

## 2012-03-19 ENCOUNTER — Ambulatory Visit (INDEPENDENT_AMBULATORY_CARE_PROVIDER_SITE_OTHER): Payer: Medicare Other | Admitting: Neurosurgery

## 2012-03-19 VITALS — BP 152/77 | HR 78 | Resp 16 | Ht 68.0 in | Wt 262.0 lb

## 2012-03-19 DIAGNOSIS — M7989 Other specified soft tissue disorders: Secondary | ICD-10-CM | POA: Insufficient documentation

## 2012-03-19 DIAGNOSIS — N186 End stage renal disease: Secondary | ICD-10-CM

## 2012-03-19 NOTE — Progress Notes (Signed)
Subjective:     Patient ID: Dale [name], male   DOB: 1966-02-16, 46 y.o.   MRN: [id number]  HPI: 17 urine oh patient last seen by Dr. Kellie Simmering in may of 2013 status post creation of a right brachial artery cephalic vein AV fistula that was done in April of 2013. The patient had a shuntogram done by Dr. Vernard Gambles 02/02/2012 which showed no outflow stenosis. The patient came in today with complaints of swelling in his arm and numbness in his fourth and fifth digit.   Review of Systems: 12 point review of systems is notable for the difficulties described above otherwise unremarkable     Objective:   Physical Exam: Afebrile, vital signs are stable, there's a good thrill palpable throughout the fistula, and the fistula is compressed the radial pulse does return. Dr. Kellie Simmering examined the patient as well.     Assessment:     Functioning right AV fistula not being used for hemodialysis as the patient has a kidney transplant that is functioning at about 25%.    Plan:     Per Dr. Kellie Simmering the patient should elevate his arm when his swelling becomes problematic, if he has chronic swelling or sudden onset of extreme swelling he should contact our office, he does understand this. The patient will followup on when necessary basis, his questions were encouraged and answered.  Beatris Ship ANP  Clinic M.D.: Kellie Simmering

## 2012-03-26 ENCOUNTER — Other Ambulatory Visit: Payer: Self-pay | Admitting: Gastroenterology

## 2012-03-26 MED ORDER — RANITIDINE HCL 150 MG PO TABS: 150.0000 mg | ORAL_TABLET | Freq: Two times a day (BID) | ORAL | Status: DC

## 2012-03-28 ENCOUNTER — Other Ambulatory Visit: Payer: Self-pay | Admitting: Gastroenterology

## 2012-03-30 ENCOUNTER — Encounter (HOSPITAL_COMMUNITY)
Admission: RE | Admit: 2012-03-30 | Discharge: 2012-03-30 | Disposition: A | Discharge status: Home / Self Care | Payer: Medicare Other | Source: Ambulatory Visit | Attending: Vascular Surgery | Admitting: Vascular Surgery

## 2012-03-30 DIAGNOSIS — D638 Anemia in other chronic diseases classified elsewhere: Secondary | ICD-10-CM | POA: Insufficient documentation

## 2012-03-30 DIAGNOSIS — N184 Chronic kidney disease, stage 4 (severe): Secondary | ICD-10-CM | POA: Insufficient documentation

## 2012-03-30 LAB — POCT HEMOGLOBIN-HEMACUE: Hemoglobin: 11.5 g/dL — ABNORMAL LOW (ref 13.0–17.0)

## 2012-03-30 MED ORDER — EPOETIN ALFA 40000 UNIT/ML IJ SOLN
40000.0000 [IU] | INTRAMUSCULAR | Status: DC
  Administered 2012-03-30: 40000 [IU] via SUBCUTANEOUS

## 2012-03-30 MED ORDER — EPOETIN ALFA 40000 UNIT/ML IJ SOLN
INTRAMUSCULAR | Status: AC
  Administered 2012-03-30: 40000 [IU] via SUBCUTANEOUS
  Filled 2012-03-30: qty 1

## 2012-04-20 ENCOUNTER — Encounter (HOSPITAL_COMMUNITY)
Admission: RE | Admit: 2012-04-20 | Discharge: 2012-04-20 | Disposition: A | Discharge status: Home / Self Care | Payer: Medicare Other | Source: Ambulatory Visit | Attending: Nephrology | Admitting: Nephrology

## 2012-04-20 LAB — POCT HEMOGLOBIN-HEMACUE: Hemoglobin: 11 g/dL — ABNORMAL LOW (ref 13.0–17.0)

## 2012-04-20 MED ORDER — EPOETIN ALFA 40000 UNIT/ML IJ SOLN
40000.0000 [IU] | INTRAMUSCULAR | Status: DC
  Administered 2012-04-20: 40000 [IU] via SUBCUTANEOUS

## 2012-04-20 MED ORDER — EPOETIN ALFA 40000 UNIT/ML IJ SOLN
INTRAMUSCULAR | Status: AC
  Filled 2012-04-20: qty 1

## 2012-05-11 ENCOUNTER — Encounter (HOSPITAL_COMMUNITY)
Admission: RE | Admit: 2012-05-11 | Discharge: 2012-05-11 | Disposition: A | Discharge status: Home / Self Care | Payer: Medicare Other | Source: Ambulatory Visit | Attending: Vascular Surgery | Admitting: Vascular Surgery

## 2012-05-11 DIAGNOSIS — N184 Chronic kidney disease, stage 4 (severe): Secondary | ICD-10-CM | POA: Insufficient documentation

## 2012-05-11 DIAGNOSIS — D638 Anemia in other chronic diseases classified elsewhere: Secondary | ICD-10-CM | POA: Insufficient documentation

## 2012-05-11 LAB — IRON AND TIBC
Iron: 57 ug/dL (ref 42–135)
Saturation Ratios: 34 % (ref 20–55)
TIBC: 168 ug/dL — ABNORMAL LOW (ref 215–435)
UIBC: 111 ug/dL — ABNORMAL LOW (ref 125–400)

## 2012-05-11 LAB — POCT HEMOGLOBIN-HEMACUE: Hemoglobin: 10.7 g/dL — ABNORMAL LOW (ref 13.0–17.0)

## 2012-05-11 LAB — FERRITIN: Ferritin: 350 ng/mL — ABNORMAL HIGH (ref 22–322)

## 2012-05-11 MED ORDER — EPOETIN ALFA 40000 UNIT/ML IJ SOLN
40000.0000 [IU] | INTRAMUSCULAR | Status: DC
  Administered 2012-05-11: 40000 [IU] via SUBCUTANEOUS

## 2012-05-11 MED ORDER — EPOETIN ALFA 40000 UNIT/ML IJ SOLN
INTRAMUSCULAR | Status: AC
  Filled 2012-05-11: qty 1

## 2012-05-31 ENCOUNTER — Other Ambulatory Visit (HOSPITAL_COMMUNITY): Payer: Self-pay | Admitting: *Deleted

## 2012-06-01 ENCOUNTER — Encounter (HOSPITAL_COMMUNITY)
Admission: RE | Admit: 2012-06-01 | Discharge: 2012-06-01 | Disposition: A | Discharge status: Home / Self Care | Payer: Medicare Other | Source: Ambulatory Visit | Attending: Vascular Surgery | Admitting: Vascular Surgery

## 2012-06-01 DIAGNOSIS — N184 Chronic kidney disease, stage 4 (severe): Secondary | ICD-10-CM | POA: Insufficient documentation

## 2012-06-01 DIAGNOSIS — D638 Anemia in other chronic diseases classified elsewhere: Secondary | ICD-10-CM | POA: Insufficient documentation

## 2012-06-01 LAB — POCT HEMOGLOBIN-HEMACUE: Hemoglobin: 10.9 g/dL — ABNORMAL LOW (ref 13.0–17.0)

## 2012-06-01 MED ORDER — EPOETIN ALFA 40000 UNIT/ML IJ SOLN
40000.0000 [IU] | INTRAMUSCULAR | Status: DC
  Administered 2012-06-01: 40000 [IU] via SUBCUTANEOUS

## 2012-06-01 MED ORDER — EPOETIN ALFA 40000 UNIT/ML IJ SOLN
INTRAMUSCULAR | Status: AC
  Filled 2012-06-01: qty 1

## 2012-06-17 ENCOUNTER — Emergency Department (HOSPITAL_COMMUNITY): Payer: Medicare Other

## 2012-06-17 ENCOUNTER — Encounter (HOSPITAL_COMMUNITY): Payer: Self-pay | Admitting: Emergency Medicine

## 2012-06-17 ENCOUNTER — Observation Stay (HOSPITAL_COMMUNITY)
Admission: EM | Admit: 2012-06-17 | Discharge: 2012-06-19 | Disposition: A | Discharge status: Home / Self Care | Payer: Medicare Other | Attending: Internal Medicine | Admitting: Internal Medicine

## 2012-06-17 DIAGNOSIS — N186 End stage renal disease: Secondary | ICD-10-CM

## 2012-06-17 DIAGNOSIS — G4733 Obstructive sleep apnea (adult) (pediatric): Secondary | ICD-10-CM

## 2012-06-17 DIAGNOSIS — Z940 Kidney transplant status: Secondary | ICD-10-CM | POA: Insufficient documentation

## 2012-06-17 DIAGNOSIS — I509 Heart failure, unspecified: Secondary | ICD-10-CM

## 2012-06-17 DIAGNOSIS — M7989 Other specified soft tissue disorders: Secondary | ICD-10-CM

## 2012-06-17 DIAGNOSIS — I120 Hypertensive chronic kidney disease with stage 5 chronic kidney disease or end stage renal disease: Secondary | ICD-10-CM | POA: Insufficient documentation

## 2012-06-17 DIAGNOSIS — I3139 Other pericardial effusion (noninflammatory): Secondary | ICD-10-CM

## 2012-06-17 DIAGNOSIS — D631 Anemia in chronic kidney disease: Secondary | ICD-10-CM | POA: Insufficient documentation

## 2012-06-17 DIAGNOSIS — I5033 Acute on chronic diastolic (congestive) heart failure: Secondary | ICD-10-CM | POA: Diagnosis present

## 2012-06-17 DIAGNOSIS — Z9641 Presence of insulin pump (external) (internal): Secondary | ICD-10-CM | POA: Insufficient documentation

## 2012-06-17 DIAGNOSIS — I313 Pericardial effusion (noninflammatory): Secondary | ICD-10-CM

## 2012-06-17 DIAGNOSIS — E039 Hypothyroidism, unspecified: Secondary | ICD-10-CM | POA: Insufficient documentation

## 2012-06-17 DIAGNOSIS — K5900 Constipation, unspecified: Secondary | ICD-10-CM

## 2012-06-17 DIAGNOSIS — Z79899 Other long term (current) drug therapy: Secondary | ICD-10-CM | POA: Insufficient documentation

## 2012-06-17 DIAGNOSIS — R079 Chest pain, unspecified: Principal | ICD-10-CM | POA: Insufficient documentation

## 2012-06-17 DIAGNOSIS — S88119A Complete traumatic amputation at level between knee and ankle, unspecified lower leg, initial encounter: Secondary | ICD-10-CM | POA: Insufficient documentation

## 2012-06-17 DIAGNOSIS — N189 Chronic kidney disease, unspecified: Secondary | ICD-10-CM

## 2012-06-17 DIAGNOSIS — Z89512 Acquired absence of left leg below knee: Secondary | ICD-10-CM

## 2012-06-17 DIAGNOSIS — Z794 Long term (current) use of insulin: Secondary | ICD-10-CM | POA: Insufficient documentation

## 2012-06-17 DIAGNOSIS — R0602 Shortness of breath: Secondary | ICD-10-CM | POA: Insufficient documentation

## 2012-06-17 DIAGNOSIS — D696 Thrombocytopenia, unspecified: Secondary | ICD-10-CM | POA: Insufficient documentation

## 2012-06-17 DIAGNOSIS — E669 Obesity, unspecified: Secondary | ICD-10-CM | POA: Insufficient documentation

## 2012-06-17 DIAGNOSIS — E119 Type 2 diabetes mellitus without complications: Secondary | ICD-10-CM

## 2012-06-17 DIAGNOSIS — E1121 Type 2 diabetes mellitus with diabetic nephropathy: Secondary | ICD-10-CM | POA: Diagnosis present

## 2012-06-17 DIAGNOSIS — M109 Gout, unspecified: Secondary | ICD-10-CM | POA: Insufficient documentation

## 2012-06-17 DIAGNOSIS — Z6841 Body Mass Index (BMI) 40.0 and over, adult: Secondary | ICD-10-CM | POA: Insufficient documentation

## 2012-06-17 DIAGNOSIS — R0600 Dyspnea, unspecified: Secondary | ICD-10-CM

## 2012-06-17 DIAGNOSIS — K559 Vascular disorder of intestine, unspecified: Secondary | ICD-10-CM

## 2012-06-17 DIAGNOSIS — D649 Anemia, unspecified: Secondary | ICD-10-CM

## 2012-06-17 LAB — POCT I-STAT TROPONIN I: Troponin i, poc: 0.01 ng/mL (ref 0.00–0.08)

## 2012-06-17 LAB — CBC
HCT: 33.6 % — ABNORMAL LOW (ref 39.0–52.0)
Hemoglobin: 11.1 g/dL — ABNORMAL LOW (ref 13.0–17.0)
MCH: 31.7 pg (ref 26.0–34.0)
MCHC: 33 g/dL (ref 30.0–36.0)
MCV: 96 fL (ref 78.0–100.0)
Platelets: 206 10*3/uL (ref 150–400)
RBC: 3.5 MIL/uL — ABNORMAL LOW (ref 4.22–5.81)
RDW: 15 % (ref 11.5–15.5)
WBC: 8.8 10*3/uL (ref 4.0–10.5)

## 2012-06-17 LAB — BASIC METABOLIC PANEL
BUN: 98 mg/dL — ABNORMAL HIGH (ref 6–23)
CO2: 26 mEq/L (ref 19–32)
Calcium: 9.9 mg/dL (ref 8.4–10.5)
Chloride: 98 mEq/L (ref 96–112)
Creatinine, Ser: 5.39 mg/dL — ABNORMAL HIGH (ref 0.50–1.35)
GFR calc Af Amer: 13 mL/min — ABNORMAL LOW (ref >90)
GFR calc non Af Amer: 12 mL/min — ABNORMAL LOW (ref >90)
Glucose, Bld: 235 mg/dL — ABNORMAL HIGH (ref 70–99)
Potassium: 3.9 mEq/L (ref 3.5–5.1)
Sodium: 136 mEq/L (ref 135–145)

## 2012-06-17 MED ORDER — NITROGLYCERIN 0.4 MG SL SUBL: 0.4000 mg | SUBLINGUAL_TABLET | SUBLINGUAL | Status: DC | PRN

## 2012-06-17 MED ORDER — ASPIRIN 81 MG PO CHEW
324.0000 mg | CHEWABLE_TABLET | Freq: Once | ORAL | Status: AC
  Administered 2012-06-18: 324 mg via ORAL
  Filled 2012-06-17: qty 4

## 2012-06-17 NOTE — ED Notes (Signed)
Patient with chest pain that started approx 90 mins ago.  Patient denies shortness of breath, but does have some nausea.

## 2012-06-17 NOTE — ED Provider Notes (Signed)
History     CSN: [id number]  Arrival date & time 06/17/12  2042   First MD Initiated Contact with Patient 06/17/12 2308      Chief Complaint  Patient presents with  . Chest Pain    (Consider location/radiation/quality/duration/timing/severity/associated sxs/prior treatment) HPI Hx per PT. CP tonight, L sided radiates to L arm, feels pressure like in quality, was 9/10, took NTG that helped some, pain returned, brought in by wife. Now pain is very mild. No SOB, has had some recent weight gain with h/o CHF.  Has renal insuff followed by Nephrology.  Required dialysis for 3-4 months last year and since then is being monitored closely. some nausea, no vomiting, no diaphoresis. Pain has been persistent since onset a few hours PTA.  Past Medical History  Diagnosis Date  . Insulin dependent diabetes mellitus   . Hypertension   . History of blood transfusion   . ESRD (end stage renal disease)     a. 1995 s/p cadaveric transplant w/ susbequent failure after 18 yrs;  b.Dialysis initiated 07/2011  . Dyspnea     a. 07/23/11 Echo: EF 55-60%  . Anemia of chronic disease   . Cholecystitis     a. 08/27/2011  . Constipation   . Pericardial effusion     a.  Small by CT 08/22/11;  b.  Large by CT 08/27/11  . Inguinal lymphadenopathy     a. bilateral - s/p biopsy 07/2011    Past Surgical History  Procedure Laterality Date  . Appendectomy    . Hernia repair    . Kidney transplant    . Cataract extraction    . Arteriovenous graft placement    . Av fistula placement    . Toe amputation    . Below knee leg amputation    . Carpal tunnel release    . Shoulder surgery    . Av fistula placement  07/29/2011    Procedure: ARTERIOVENOUS (AV) FISTULA CREATION;  Surgeon: Mal Misty, MD;  Location: [facility];  Service: Vascular;  Laterality: Right;  Brachial cephalic  . Insertion of dialysis catheter  08/01/2011    Procedure: INSERTION OF DIALYSIS CATHETER;  Surgeon: Rosetta Posner, MD;  Location: [facility];  Service:  Vascular;  Laterality: Right;  insertion of dialysis catheter on right internal jugular vein  . Lymph node biopsy  08/10/2011    Procedure: LYMPH NODE BIOPSY;  Surgeon: Merrie Roof, MD;  Location: [facility];  Service: General;  Laterality: Left;  left groin lymph biopsy  . Colonoscopy  08/29/2011    Procedure: COLONOSCOPY;  Surgeon: Jerene Bears, MD;  Location: [facility];  Service: Gastroenterology;  Laterality: N/A;    Family History  Problem Relation Age of Onset  . Malignant hyperthermia Neg Hx   . Colon polyps Father   . Diabetes Father   . Cancer Father   . Breast cancer Maternal Aunt   . Cancer Maternal Aunt     Breast and Bone    History  Substance Use Topics  . Smoking status: Never Smoker   . Smokeless tobacco: Never Used  . Alcohol Use: No     Comment: occasionally 1xmonth      Review of Systems  Constitutional: Negative for fever and chills.  HENT: Negative for neck pain and neck stiffness.   Eyes: Negative for pain.  Respiratory: Negative for cough and shortness of breath.   Cardiovascular: Positive for chest pain.  Gastrointestinal: Negative for vomiting and  abdominal pain.  Genitourinary: Negative for flank pain.  Musculoskeletal: Negative for back pain.  Skin: Negative for rash.  Neurological: Negative for headaches.  All other systems reviewed and are negative.    Allergies  Morphine and related; Amoxicillin; Ciprofloxacin; Clindamycin/lincomycin; Codeine; Levofloxacin; Vasotec; and Oxycodone  Home Medications   Current Outpatient Rx  Name  Route  Sig  Dispense  Refill  . allopurinol (ZYLOPRIM) 100 MG tablet      daily. 2 Tabs         . aspirin 81 MG tablet   Oral   Take 81 mg by mouth every evening.          . calcium acetate (PHOSLO) 667 MG capsule   Oral   Take 1,334-2,001 mg by mouth 5 (five) times daily. 2001 mg with meals and 1334 mg with snacks         . cycloSPORINE (SANDIMMUNE) 25 MG capsule   Oral   Take 75 mg by mouth 2  (two) times daily.         . cycloSPORINE modified (NEORAL/GENGRAF) 100 MG capsule   Oral   Take 100 mg by mouth 2 (two) times daily. Neoral brand         . diphenhydrAMINE (BENADRYL) 25 mg capsule   Oral   Take 50 mg by mouth at bedtime as needed for itching or sleep.         [name] Kitchen epoetin alfa (EPOGEN,PROCRIT) 28413 UNIT/ML injection   Subcutaneous   Inject 40,000 Units into the skin every 21 ( twenty-one) days. If hemoglobin is below 12         . furosemide (LASIX) 80 MG tablet      160 mg 3 (three) times daily.          [name] Kitchen HYDROmorphone (DILAUDID) 2 MG tablet   Oral   Take 2 mg by mouth 4 (four) times daily as needed. For pain         . labetalol (NORMODYNE) 100 MG tablet   Oral   Take 300 mg by mouth 2 (two) times daily. Two Tabs twice daily         . levothyroxine (SYNTHROID, LEVOTHROID) 88 MCG tablet   Oral   Take 88 mcg by mouth daily.         [name] Kitchen loratadine (CLARITIN) 10 MG tablet   Oral   Take 10 mg by mouth daily.         . metoCLOPramide (REGLAN) 5 MG tablet   Oral   Take 5 mg by mouth 4 (four) times daily - after meals and at bedtime.         . nitroGLYCERIN (NITROSTAT) 0.4 MG SL tablet   Sublingual   Place 0.4 mg under the tongue every 5 (five) minutes as needed.         [name] Kitchen OVER THE COUNTER MEDICATION   Oral   Take 2 capsules by mouth 3 (three) times daily. Stool softener         . predniSONE (DELTASONE) 5 MG tablet   Oral   Take 5 mg by mouth Daily.         . promethazine (PHENERGAN) 25 MG tablet   Oral   Take 25 mg by mouth Every 6 hours as needed. For nausea         . PROVENTIL HFA 108 (90 BASE) MCG/ACT inhaler   Inhalation   Inhale 1 puff into the lungs Every 6 hours as needed. For shortness of  breath         . ranitidine (ZANTAC) 150 MG tablet   Oral   Take 1 tablet (150 mg total) by mouth 2 (two) times daily.   60 tablet   3   . rOPINIRole (REQUIP) 1 MG tablet   Oral   Take 1 mg by mouth Every evening. Take at  2000         . GLUCAGON EMERGENCY 1 MG injection      Ad lib.         . Insulin Human (INSULIN PUMP) 100 unit/ml SOLN   Subcutaneous   Inject into the skin continuous. Humalog           BP 156/73  Pulse 81  Temp(Src) 97.8 F (36.6 C) (Oral)  Resp 18  SpO2 100%  Physical Exam  Constitutional: He is oriented to person, place, and time. He appears well-developed and well-nourished.  HENT:  Head: Normocephalic and atraumatic.  Eyes: Conjunctivae and EOM are normal. Pupils are equal, round, and reactive to light.  Neck: Neck supple. No thyromegaly present.  Cardiovascular: Normal rate, regular rhythm and intact distal pulses.   Pulmonary/Chest: Effort normal and breath sounds normal. No respiratory distress. He has no wheezes. He exhibits no tenderness.  Abdominal: Soft. Bowel sounds are normal. He exhibits no distension. There is no tenderness.  Musculoskeletal: He exhibits no edema.  L BKA   Neurological: He is alert and oriented to person, place, and time.  Skin: Skin is warm and dry.    ED Course  Procedures (including critical care time)  Results for orders placed during the hospital encounter of 06/17/12  CBC      Result Value Range   WBC 8.8  4.0 - 10.5 K/uL   RBC 3.50 (*) 4.22 - 5.81 MIL/uL   Hemoglobin 11.1 (*) 13.0 - 17.0 g/dL   HCT 33.6 (*) 39.0 - 52.0 %   MCV 96.0  78.0 - 100.0 fL   MCH 31.7  26.0 - 34.0 pg   MCHC 33.0  30.0 - 36.0 g/dL   RDW 15.0  11.5 - 15.5 %   Platelets 206  150 - 400 K/uL  BASIC METABOLIC PANEL      Result Value Range   Sodium 136  135 - 145 mEq/L   Potassium 3.9  3.5 - 5.1 mEq/L   Chloride 98  96 - 112 mEq/L   CO2 26  19 - 32 mEq/L   Glucose, Bld 235 (*) 70 - 99 mg/dL   BUN 98 (*) 6 - 23 mg/dL   Creatinine, Ser 5.39 (*) 0.50 - 1.35 mg/dL   Calcium 9.9  8.4 - 10.5 mg/dL   GFR calc non Af Amer 12 (*) >90 mL/min   GFR calc Af Amer 13 (*) >90 mL/min  POCT I-STAT TROPONIN I      Result Value Range   Troponin i, poc 0.01   0.00 - 0.08 ng/mL   Comment 3            Dg Chest Portable 1 View  06/17/2012  *RADIOLOGY REPORT*  Clinical Data: Mid chest pain and shortness of breath.  PORTABLE CHEST - 1 VIEW  Comparison: 09/01/2011  Findings: Slightly shallow inspiration.  Borderline heart size and pulmonary vascularity are likely normal for technique.  No focal consolidation or airspace disease in the lungs.  No blunting of costophrenic angles.  No pneumothorax.  Mediastinal contours appear intact.  Interval removal of right central venous catheter. Otherwise no  significant change.  IMPRESSION: No evidence of active pulmonary disease.   Original Report Authenticated By: Lucienne Capers, M.D.      Date: 06/17/2012  Rate: 85  Rhythm: normal sinus rhythm  QRS Axis: normal  Intervals: normal  ST/T Wave abnormalities: nonspecific ST changes  Conduction Disutrbances:none  Narrative Interpretation:   Old EKG Reviewed: none available  NTG and ASA provided. Morphine allergy confirmed with PT  12:06 AM MED consult - DR Rush Landmark to admit - will require inpatient renal consult - he is still making urine, has elevated BUN/ crt  MDM   CP with sig ACS risk factors in PT with IDDM, renal transplant, h/o dilaysis and CHF - no clinical heart failure in the ED.   CXR, ECG, labs  MED consult for admit        Teressa Lower, MD 06/18/12 [phone]

## 2012-06-18 ENCOUNTER — Encounter (HOSPITAL_COMMUNITY): Payer: Self-pay | Admitting: Internal Medicine

## 2012-06-18 DIAGNOSIS — N189 Chronic kidney disease, unspecified: Secondary | ICD-10-CM

## 2012-06-18 DIAGNOSIS — E119 Type 2 diabetes mellitus without complications: Secondary | ICD-10-CM

## 2012-06-18 DIAGNOSIS — R079 Chest pain, unspecified: Secondary | ICD-10-CM | POA: Diagnosis present

## 2012-06-18 DIAGNOSIS — I509 Heart failure, unspecified: Secondary | ICD-10-CM

## 2012-06-18 DIAGNOSIS — I369 Nonrheumatic tricuspid valve disorder, unspecified: Secondary | ICD-10-CM

## 2012-06-18 LAB — URINALYSIS, ROUTINE W REFLEX MICROSCOPIC
Bilirubin Urine: NEGATIVE
Glucose, UA: 100 mg/dL — AB
Ketones, ur: NEGATIVE mg/dL
Nitrite: NEGATIVE
Protein, ur: 100 mg/dL — AB
Specific Gravity, Urine: 1.013 (ref 1.005–1.030)
Urobilinogen, UA: 0.2 mg/dL (ref 0.0–1.0)
pH: 5.5 (ref 5.0–8.0)

## 2012-06-18 LAB — CBC
HCT: 33.2 % — ABNORMAL LOW (ref 39.0–52.0)
HCT: 32.3 % — ABNORMAL LOW (ref 39.0–52.0)
Hemoglobin: 10.9 g/dL — ABNORMAL LOW (ref 13.0–17.0)
Hemoglobin: 10.9 g/dL — ABNORMAL LOW (ref 13.0–17.0)
MCH: 31.1 pg (ref 26.0–34.0)
MCH: 32 pg (ref 26.0–34.0)
MCHC: 32.8 g/dL (ref 30.0–36.0)
MCHC: 33.7 g/dL (ref 30.0–36.0)
MCV: 94.9 fL (ref 78.0–100.0)
MCV: 94.7 fL (ref 78.0–100.0)
Platelets: 127 10*3/uL — ABNORMAL LOW (ref 150–400)
Platelets: 128 10*3/uL — ABNORMAL LOW (ref 150–400)
RBC: 3.5 MIL/uL — ABNORMAL LOW (ref 4.22–5.81)
RBC: 3.41 MIL/uL — ABNORMAL LOW (ref 4.22–5.81)
RDW: 15.1 % (ref 11.5–15.5)
RDW: 15 % (ref 11.5–15.5)
WBC: 8.8 10*3/uL (ref 4.0–10.5)
WBC: 9 10*3/uL (ref 4.0–10.5)

## 2012-06-18 LAB — BASIC METABOLIC PANEL
BUN: 98 mg/dL — ABNORMAL HIGH (ref 6–23)
CO2: 25 mEq/L (ref 19–32)
Calcium: 9.8 mg/dL (ref 8.4–10.5)
Chloride: 96 mEq/L (ref 96–112)
Creatinine, Ser: 5.29 mg/dL — ABNORMAL HIGH (ref 0.50–1.35)
GFR calc Af Amer: 14 mL/min — ABNORMAL LOW (ref >90)
GFR calc non Af Amer: 12 mL/min — ABNORMAL LOW (ref >90)
Glucose, Bld: 185 mg/dL — ABNORMAL HIGH (ref 70–99)
Potassium: 3.6 mEq/L (ref 3.5–5.1)
Sodium: 133 mEq/L — ABNORMAL LOW (ref 135–145)

## 2012-06-18 LAB — URINE MICROSCOPIC-ADD ON

## 2012-06-18 LAB — TROPONIN I
Troponin I: <0.3 ng/mL (ref <0.30)
Troponin I: <0.3 ng/mL (ref <0.30)
Troponin I: <0.3 ng/mL (ref <0.30)

## 2012-06-18 LAB — GLUCOSE, CAPILLARY
Glucose-Capillary: 163 mg/dL — ABNORMAL HIGH (ref 70–99)
Glucose-Capillary: 86 mg/dL (ref 70–99)
Glucose-Capillary: 207 mg/dL — ABNORMAL HIGH (ref 70–99)
Glucose-Capillary: 272 mg/dL — ABNORMAL HIGH (ref 70–99)
Glucose-Capillary: 81 mg/dL (ref 70–99)

## 2012-06-18 LAB — MRSA PCR SCREENING: MRSA by PCR: NEGATIVE

## 2012-06-18 MED ORDER — INSULIN PUMP
Freq: Three times a day (TID) | SUBCUTANEOUS | Status: DC
  Administered 2012-06-18: 07:00:00 via SUBCUTANEOUS
  Filled 2012-06-18: qty 1

## 2012-06-18 MED ORDER — INSULIN PUMP
Freq: Three times a day (TID) | SUBCUTANEOUS | Status: DC
  Administered 2012-06-18: 9 via SUBCUTANEOUS
  Administered 2012-06-18: 8.35 via SUBCUTANEOUS
  Administered 2012-06-19: 6.2 via SUBCUTANEOUS
  Administered 2012-06-19: 3.6 via SUBCUTANEOUS
  Filled 2012-06-18: qty 1

## 2012-06-18 MED ORDER — SODIUM CHLORIDE 0.9 % IJ SOLN: 3.0000 mL | Freq: Two times a day (BID) | INTRAMUSCULAR | Status: DC

## 2012-06-18 MED ORDER — INSULIN PUMP
SUBCUTANEOUS | Status: DC
  Filled 2012-06-18: qty 1

## 2012-06-18 MED ORDER — ACETAMINOPHEN 650 MG RE SUPP: 650.0000 mg | Freq: Four times a day (QID) | RECTAL | Status: DC | PRN

## 2012-06-18 MED ORDER — CYCLOSPORINE MODIFIED (NEORAL) 25 MG PO CAPS
175.0000 mg | ORAL_CAPSULE | Freq: Two times a day (BID) | ORAL | Status: DC
  Administered 2012-06-18 – 2012-06-19 (×3): 175 mg via ORAL
  Filled 2012-06-18 (×4): qty 7

## 2012-06-18 MED ORDER — ALBUTEROL SULFATE HFA 108 (90 BASE) MCG/ACT IN AERS: 1.0000 | INHALATION_SPRAY | RESPIRATORY_TRACT | Status: DC | PRN

## 2012-06-18 MED ORDER — HYDROMORPHONE HCL 2 MG PO TABS
2.0000 mg | ORAL_TABLET | Freq: Once | ORAL | Status: AC
  Administered 2012-06-18: 2 mg via ORAL
  Filled 2012-06-18: qty 1

## 2012-06-18 MED ORDER — FUROSEMIDE 80 MG PO TABS
160.0000 mg | ORAL_TABLET | Freq: Three times a day (TID) | ORAL | Status: DC
  Administered 2012-06-18 – 2012-06-19 (×5): 160 mg via ORAL
  Filled 2012-06-18 (×7): qty 2

## 2012-06-18 MED ORDER — LABETALOL HCL 300 MG PO TABS
300.0000 mg | ORAL_TABLET | Freq: Two times a day (BID) | ORAL | Status: DC
  Administered 2012-06-18: 300 mg via ORAL
  Filled 2012-06-18 (×2): qty 1

## 2012-06-18 MED ORDER — FUROSEMIDE 80 MG PO TABS
160.0000 mg | ORAL_TABLET | Freq: Three times a day (TID) | ORAL | Status: DC
  Filled 2012-06-18 (×3): qty 2

## 2012-06-18 MED ORDER — CALCIUM ACETATE 667 MG PO CAPS
2001.0000 mg | ORAL_CAPSULE | Freq: Three times a day (TID) | ORAL | Status: DC
  Administered 2012-06-18 – 2012-06-19 (×5): 2001 mg via ORAL
  Filled 2012-06-18 (×7): qty 3

## 2012-06-18 MED ORDER — ACETAMINOPHEN 325 MG PO TABS: 650.0000 mg | ORAL_TABLET | Freq: Four times a day (QID) | ORAL | Status: DC | PRN

## 2012-06-18 MED ORDER — SODIUM CHLORIDE 0.9 % IJ SOLN
3.0000 mL | Freq: Two times a day (BID) | INTRAMUSCULAR | Status: DC
  Administered 2012-06-18 – 2012-06-19 (×4): 3 mL via INTRAVENOUS

## 2012-06-18 MED ORDER — METOCLOPRAMIDE HCL 5 MG PO TABS
5.0000 mg | ORAL_TABLET | Freq: Three times a day (TID) | ORAL | Status: DC
  Administered 2012-06-18 – 2012-06-19 (×6): 5 mg via ORAL
  Filled 2012-06-18 (×9): qty 1

## 2012-06-18 MED ORDER — LORATADINE 10 MG PO TABS
10.0000 mg | ORAL_TABLET | Freq: Every day | ORAL | Status: DC
  Administered 2012-06-18 – 2012-06-19 (×2): 10 mg via ORAL
  Filled 2012-06-18 (×2): qty 1

## 2012-06-18 MED ORDER — ASPIRIN EC 325 MG PO TBEC
325.0000 mg | DELAYED_RELEASE_TABLET | Freq: Every day | ORAL | Status: DC
  Administered 2012-06-18 – 2012-06-19 (×2): 325 mg via ORAL
  Filled 2012-06-18 (×2): qty 1

## 2012-06-18 MED ORDER — DARBEPOETIN ALFA-POLYSORBATE 100 MCG/0.5ML IJ SOLN: 100.0000 ug | INTRAMUSCULAR | Status: DC

## 2012-06-18 MED ORDER — FAMOTIDINE 20 MG PO TABS
20.0000 mg | ORAL_TABLET | Freq: Every day | ORAL | Status: DC
  Administered 2012-06-18 – 2012-06-19 (×2): 20 mg via ORAL
  Filled 2012-06-18 (×2): qty 1

## 2012-06-18 MED ORDER — EPOETIN ALFA 40000 UNIT/ML IJ SOLN: 40000.0000 [IU] | INTRAMUSCULAR | Status: DC

## 2012-06-18 MED ORDER — CYCLOSPORINE MODIFIED (NEORAL) 100 MG PO CAPS: 100.0000 mg | ORAL_CAPSULE | Freq: Two times a day (BID) | ORAL | Status: DC

## 2012-06-18 MED ORDER — CYCLOSPORINE 25 MG PO CAPS: 75.0000 mg | ORAL_CAPSULE | Freq: Two times a day (BID) | ORAL | Status: DC

## 2012-06-18 MED ORDER — HYDROMORPHONE HCL 2 MG PO TABS
2.0000 mg | ORAL_TABLET | Freq: Four times a day (QID) | ORAL | Status: DC | PRN
  Administered 2012-06-18: 2 mg via ORAL
  Filled 2012-06-18: qty 1

## 2012-06-18 MED ORDER — ONDANSETRON HCL 4 MG/2ML IJ SOLN
4.0000 mg | Freq: Four times a day (QID) | INTRAMUSCULAR | Status: DC | PRN
  Administered 2012-06-19: 4 mg via INTRAVENOUS
  Filled 2012-06-18: qty 2

## 2012-06-18 MED ORDER — AMLODIPINE BESYLATE 5 MG PO TABS
5.0000 mg | ORAL_TABLET | Freq: Every day | ORAL | Status: DC
  Administered 2012-06-18 – 2012-06-19 (×2): 5 mg via ORAL
  Filled 2012-06-18 (×2): qty 1

## 2012-06-18 MED ORDER — LEVOTHYROXINE SODIUM 88 MCG PO TABS
88.0000 ug | ORAL_TABLET | Freq: Every day | ORAL | Status: DC
  Administered 2012-06-18 – 2012-06-19 (×2): 88 ug via ORAL
  Filled 2012-06-18 (×3): qty 1

## 2012-06-18 MED ORDER — HYDROMORPHONE HCL PF 1 MG/ML IJ SOLN
1.0000 mg | INTRAMUSCULAR | Status: DC | PRN
  Administered 2012-06-18 – 2012-06-19 (×6): 1 mg via INTRAVENOUS
  Filled 2012-06-18 (×6): qty 1

## 2012-06-18 MED ORDER — ROPINIROLE HCL 1 MG PO TABS
1.0000 mg | ORAL_TABLET | Freq: Every day | ORAL | Status: DC
  Administered 2012-06-18: 1 mg via ORAL
  Filled 2012-06-18 (×2): qty 1

## 2012-06-18 MED ORDER — DIPHENHYDRAMINE HCL 25 MG PO CAPS: 50.0000 mg | ORAL_CAPSULE | Freq: Every evening | ORAL | Status: DC | PRN

## 2012-06-18 MED ORDER — HEPARIN SODIUM (PORCINE) 5000 UNIT/ML IJ SOLN
5000.0000 [IU] | Freq: Three times a day (TID) | INTRAMUSCULAR | Status: DC
  Administered 2012-06-18 – 2012-06-19 (×4): 5000 [IU] via SUBCUTANEOUS
  Filled 2012-06-18 (×7): qty 1

## 2012-06-18 MED ORDER — DOCUSATE SODIUM 100 MG PO CAPS
200.0000 mg | ORAL_CAPSULE | Freq: Three times a day (TID) | ORAL | Status: DC
  Administered 2012-06-18 – 2012-06-19 (×2): 200 mg via ORAL
  Filled 2012-06-18 (×4): qty 2

## 2012-06-18 MED ORDER — CARVEDILOL 6.25 MG PO TABS
6.2500 mg | ORAL_TABLET | Freq: Two times a day (BID) | ORAL | Status: DC
  Administered 2012-06-18 – 2012-06-19 (×2): 6.25 mg via ORAL
  Filled 2012-06-18 (×4): qty 1

## 2012-06-18 MED ORDER — PREDNISONE 5 MG PO TABS
5.0000 mg | ORAL_TABLET | Freq: Every day | ORAL | Status: DC
  Administered 2012-06-18 – 2012-06-19 (×2): 5 mg via ORAL
  Filled 2012-06-18 (×3): qty 1

## 2012-06-18 MED ORDER — CALCIUM ACETATE 667 MG PO CAPS
1334.0000 mg | ORAL_CAPSULE | Freq: Two times a day (BID) | ORAL | Status: DC | PRN
  Filled 2012-06-18: qty 2

## 2012-06-18 MED ORDER — ONDANSETRON HCL 4 MG PO TABS: 4.0000 mg | ORAL_TABLET | Freq: Four times a day (QID) | ORAL | Status: DC | PRN

## 2012-06-18 MED ORDER — ALLOPURINOL 100 MG PO TABS
200.0000 mg | ORAL_TABLET | Freq: Every day | ORAL | Status: DC
  Administered 2012-06-18 – 2012-06-19 (×2): 200 mg via ORAL
  Filled 2012-06-18 (×2): qty 2

## 2012-06-18 MED ORDER — CALCIUM ACETATE 667 MG PO CAPS: 1334.0000 mg | ORAL_CAPSULE | Freq: Every day | ORAL | Status: DC

## 2012-06-18 NOTE — ED Notes (Signed)
Pt states he currently has no pain in his chest. Reports some mild to moderate pain in his L arm and R Knee. Patient denies any needs at this time.

## 2012-06-18 NOTE — Progress Notes (Signed)
Inpatient Diabetes Program Recommendations  AACE/ADA: New Consensus Statement on Inpatient Glycemic Control (2013)  Target Ranges:  Prepandial:   less than 140 mg/dL      Peak postprandial:   less than 180 mg/dL (1-2 hours)      Critically ill patients:  140 - 180 mg/dL   Reason for Visit: Pt using insulin pump while in the hospital (Assessment) Spoke with patient who reviewed his pump settings as following: Basal rates: 2400-0300 @ 1 unit/hr          0300-0800 @ 1.95 units/hr          0800-1200 @ 1.4 units/hr          1200-1600 @ 1.6 units/hr Total basal units/24 hrs is 37.55 units  Bolus set up is as follows: Insulin to carbohydrate ratio @ 2400 is 1 unit/12 gms CHO                  @12  noon is 1 unit/10 gms CHO Insulin Sensitivity Factor is 20 times 24 hrs. Target glucose is: 120 mg/dL times 24 hrs. Pt due to change out his insertion set tomorrow afternoon. Pump appears to be functioning properly, and pt is well able to operate without problem.   Note: Thank you, Rosita Kea, RN, CNS, Diabetes Coordinator [phone])

## 2012-06-18 NOTE — Progress Notes (Signed)
  Echocardiogram 2D Echocardiogram has been performed.  Diamond Nickel 06/18/2012, 9:57 AM

## 2012-06-18 NOTE — Progress Notes (Signed)
Utilization review completed.  

## 2012-06-18 NOTE — Progress Notes (Signed)
[facility] PROGRESS NOTE  Dale [name] [id number] DOB: March 17, 1966 DOA: 06/17/2012 PCP: Windy Kalata, MD  Brief narrative 47 year old male with history of DM 1 on insulin pump, ESRD/cadaveric renal transplant 1995 on immunosuppressants, off dialysis since July 2013, HTN, OSA-unable to tolerate CPAP, fibromyalgia, anemia, left BKA admitted on 2/24 with complaints of chest pain and left upper extremity pain which started at 7:30 PM on 2/23. He initially noticed pain starting in the left shoulder and radiating down to the left upper extremity which was not worsened by limb movements. Half an hour later he noticed lower substernal pressure type of chest pain rated as 8/10 in severity. He took 2 sublingual nitroglycerin which eventually relieved pain. Stress test done in [place] approximately 1-1/2 years ago said to be negative. Admitted for further evaluation and management.   Assessment/Plan: 1. Chest pain and left upper extremity pain, suspicious for angina: Patient has multiple CAD risk factors-DM 1, HTN, obesity, OSA & CKD. Currently chest pain and left upper extremity pain have resolved. Ruled out for MI. Follow 2-D echo results. [facility] consult cardiology for further evaluation. 2. 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 Creatinine probably at baseline. Trend BMP. Continue Cyclosporin & Prednisone. Continue Lasix. 3. Type 1 DM: Reasonable in patient control. Continue Insulin pump. 4. HTN: Mildly uncontrolled. Continue Labetalol. 5. OSA/Morbid Obesity: has not tolerated OSA as OP. 6. Hypothyroid: Continue Synthroid. 7. Anemia, of CKD: Stable. Continue Aranesp. 8. H/O Fibromyalgia: complaining of Right hip pain. No falls. 9. Thrombocytopenia: ? Lab error- repeat CBC  Code Status: Full  Family Communication: Discussed with patient.  Disposition Plan: Home when stable.    Consultants:  Velora Heckler  cardiology  Procedures:  None  Antibiotics:  None   HPI/Subjective: No further CP or LUE pain. New Right hip pain- mild-mod, no falls.  Objective: Filed Vitals:   06/18/12 0100 06/18/12 0200 06/18/12 0231 06/18/12 0554  BP: 165/72 165/77 160/80 168/77  Pulse: 80 83 82 83  Temp:   97.8 F (36.6 C) 97.7 F (36.5 C)  TempSrc:   Oral Oral  Resp:   18 18  Height:   5\' 8"  (1.727 m)   Weight:   122.29 kg (269 lb 9.6 oz) 122.29 kg (269 lb 9.6 oz)  SpO2: 100% 97% 96% 96%    Intake/Output Summary (Last 24 hours) at 06/18/12 1040 Last data filed at 06/18/12 0900  Gross per 24 hour  Intake    240 ml  Output      0 ml  Net    240 ml   Filed Weights   06/18/12 0231 06/18/12 0554  Weight: 122.29 kg (269 lb 9.6 oz) 122.29 kg (269 lb 9.6 oz)    Exam:   General exam: Comfortable. Morbidly obese. Sitting at edge of bed.   Respiratory system: Clear. No increased work of breathing.  Cardiovascular system: S1 & S2 heard, RRR. No JVD, murmurs, gallops, clicks or pedal edema.Telemetry: SR  Gastrointestinal system: Abdomen is obese/ nondistended, soft and nontender. Normal bowel sounds heard.  Central nervous system: Alert and oriented. No focal neurological deficits.  Extremities: Left BKA- has prosthesis. Rightl Leg in ACE wrap (seen by Dr. Delora Fuel). Symmetric 5/5 power.   Data Reviewed: Basic Metabolic Panel:  Recent Labs Lab 06/17/12 2055 06/18/12 0256  NA 136 133*  K 3.9 3.6  CL 98 96  CO2 26 25  GLUCOSE 235* 185*  BUN 98* 98*  CREATININE 5.39* 5.29*  CALCIUM 9.9 9.8  Liver Function Tests: No results found for this basename: AST, ALT, ALKPHOS, BILITOT, PROT, ALBUMIN,  in the last 168 hours No results found for this basename: LIPASE, AMYLASE,  in the last 168 hours No results found for this basename: AMMONIA,  in the last 168 hours CBC:  Recent Labs Lab 06/17/12 2055 06/18/12 0256  WBC 8.8 8.8  HGB 11.1* 10.9*  HCT 33.6* 33.2*  MCV 96.0 94.9  PLT 206  127*   Cardiac Enzymes:  Recent Labs Lab 06/18/12 0256  TROPONINI <0.30   BNP (last 3 results) No results found for this basename: PROBNP,  in the last 8760 hours CBG:  Recent Labs Lab 06/18/12 0252 06/18/12 0727  GLUCAP 163* 86    Recent Results (from the past 240 hour(s))  MRSA PCR SCREENING     Status: None   Collection Time    06/18/12  3:18 AM      Result Value Range Status   MRSA by PCR NEGATIVE  NEGATIVE Final   Comment:            The GeneXpert MRSA Assay (FDA     approved for NASAL specimens     only), is one component of a     comprehensive MRSA colonization     surveillance program. It is not     intended to diagnose MRSA     infection nor to guide or     monitor treatment for     MRSA infections.     Studies: Dg Chest Portable 1 View  06/17/2012  *RADIOLOGY REPORT*  Clinical Data: Mid chest pain and shortness of breath.  PORTABLE CHEST - 1 VIEW  Comparison: 09/01/2011  Findings: Slightly shallow inspiration.  Borderline heart size and pulmonary vascularity are likely normal for technique.  No focal consolidation or airspace disease in the lungs.  No blunting of costophrenic angles.  No pneumothorax.  Mediastinal contours appear intact.  Interval removal of right central venous catheter. Otherwise no significant change.  IMPRESSION: No evidence of active pulmonary disease.   Original Report Authenticated By: Lucienne Capers, M.D.      Additional labs:   Scheduled Meds: . allopurinol  200 mg Oral Daily  . aspirin EC  325 mg Oral Daily  . calcium acetate  2,001 mg Oral TID WC  . cycloSPORINE modified  175 mg Oral BID  . [START ON 06/22/2012] darbepoetin  100 mcg Subcutaneous Q21 days  . famotidine  20 mg Oral Daily  . furosemide  160 mg Oral TID  . heparin  5,000 Units Subcutaneous Q8H  . insulin pump   Subcutaneous TID AC, HS, 0200  . labetalol  300 mg Oral BID  . levothyroxine  88 mcg Oral QAC breakfast  . loratadine  10 mg Oral Daily  .  metoCLOPramide  5 mg Oral TID PC & HS  . predniSONE  5 mg Oral Q breakfast  . rOPINIRole  1 mg Oral QHS  . sodium chloride  3 mL Intravenous Q12H  . sodium chloride  3 mL Intravenous Q12H   Continuous Infusions: . insulin pump      Principal Problem:   Chest pain Active Problems:   CKD (chronic kidney disease)   CHF (congestive heart failure)   Diabetes mellitus    Time spent: 35 minutes.    [facility] Pager [phone].   If 8PM-8AM, please contact night-coverage at [email or web address], password [facility] 06/18/2012, 10:40 AM  LOS: 1 day

## 2012-06-18 NOTE — Consult Note (Signed)
CARDIOLOGY CONSULT NOTE   Patient ID: Dale [name] MRN: [id number] DOB/AGE: [birth year] 47 y.o.  Admit date: 06/17/2012  Primary Physician   Dale Kalata, MD Primary Cardiologist   Dr Lyndel Safe in 2002 Reason for Consultation   Chest pain  [id number] Dale [name] is a 47 y.o. male with no history of CAD.  He saw [name] and had a stress test about a year ago that was OK. He was in his USOH yesterday. He had [place] food for dinner and shortly after eating, had onset of upper left chest pain/pressure at a 7-8/10. The pain radiated down his left arm and then he developed substernal chest pain/pressure as well. He had SL NTG and took one, temporarily decreasing the pain to approx 4/10. It then worsened to a 10/10. He took another NTG and came to the [name]. His pain eased off and completely resolved prior to arrival. It has not returned. The pain was associated with some SOB and nausea but no vomiting or diaphoresis. It did not change with movement or deep inspiration. The pain was different from his usual indigestion or MS pain symptoms. He has never had it before. He does not exert himself very much but has no history of exertional symptoms. He is resting comfortably.   Past Medical History  Diagnosis Date  . Insulin dependent diabetes mellitus   . Hypertension   . History of blood transfusion   . ESRD (end stage renal disease)     a. 1995 s/p cadaveric transplant w/ susbequent failure after 18 yrs;  b.Dialysis initiated 07/2011  . Dyspnea     a. 07/23/11 Echo: EF 55-60%  . Anemia of chronic disease   . Cholecystitis     a. 08/27/2011  . Constipation   . Pericardial effusion     a.  Small by CT 08/22/11;  b.  Large by CT 08/27/11  . Inguinal lymphadenopathy     a. bilateral - s/p biopsy 07/2011     Past Surgical History  Procedure Laterality Date  . Appendectomy    . Hernia repair    . Kidney transplant    . Cataract extraction    . Arteriovenous graft placement    . Av  fistula placement    . Toe amputation    . Below knee leg amputation    . Carpal tunnel release    . Shoulder surgery    . Av fistula placement  07/29/2011    Procedure: ARTERIOVENOUS (AV) FISTULA CREATION;  Surgeon: Mal Misty, MD;  Location: [place];  Service: Vascular;  Laterality: Right;  Brachial cephalic  . Insertion of dialysis catheter  08/01/2011    Procedure: INSERTION OF DIALYSIS CATHETER;  Surgeon: Rosetta Posner, MD;  Location: [place];  Service: Vascular;  Laterality: Right;  insertion of dialysis catheter on right internal jugular vein  . Lymph node biopsy  08/10/2011    Procedure: LYMPH NODE BIOPSY;  Surgeon: Merrie Roof, MD;  Location: [place];  Service: General;  Laterality: Left;  left groin lymph biopsy  . Colonoscopy  08/29/2011    Procedure: COLONOSCOPY;  Surgeon: Jerene Bears, MD;  Location: [place];  Service: Gastroenterology;  Laterality: N/A;    Allergies  Allergen Reactions  . Morphine And Related Rash    nausea  . Amoxicillin Other (See Comments)    Reaction unknown  . Ciprofloxacin Other (See Comments)    Reaction unknown  . Clindamycin/Lincomycin Other (See Comments)    Reaction unknown  .  Codeine Other (See Comments)    Reaction unknown. But pt has tolerated hydrocodone in Vicodin and Norco in the past  . Levofloxacin Other (See Comments)    Reaction unknown  . Vasotec Cough  . Oxycodone Rash    I have reviewed the patient's current medications . allopurinol  200 mg Oral Daily  . aspirin EC  325 mg Oral Daily  . calcium acetate  2,001 mg Oral TID WC  . cycloSPORINE modified  175 mg Oral BID  . [START ON 06/22/2012] darbepoetin  100 mcg Subcutaneous Q21 days  . famotidine  20 mg Oral Daily  . furosemide  160 mg Oral TID  . heparin  5,000 Units Subcutaneous Q8H  . insulin pump   Subcutaneous TID AC, HS, 0200  . labetalol  300 mg Oral BID  . levothyroxine  88 mcg Oral QAC breakfast  . loratadine  10 mg Oral Daily  . metoCLOPramide  5 mg Oral TID  PC & HS  . predniSONE  5 mg Oral Q breakfast  . rOPINIRole  1 mg Oral QHS  . sodium chloride  3 mL Intravenous Q12H  . sodium chloride  3 mL Intravenous Q12H   . insulin pump     acetaminophen, acetaminophen, albuterol, calcium acetate, diphenhydrAMINE, HYDROmorphone (DILAUDID) injection, HYDROmorphone, nitroGLYCERIN, ondansetron (ZOFRAN) IV, ondansetron  Prior to Admission medications   Medication Sig  allopurinol (ZYLOPRIM) 100 MG tablet daily. 2 Tabs  aspirin 81 MG tablet Take 81 mg by mouth every evening.   calcium acetate (PHOSLO) 667 MG capsule Take 1,334-2,001 mg by mouth 5 (five) times daily. 2001 mg with meals and 1334 mg with snacks  cycloSPORINE (SANDIMMUNE) 25 MG capsule Take 75 mg by mouth 2 (two) times daily.  cycloSPORINE modified (NEORAL/GENGRAF) 100 MG capsule Take 100 mg by mouth 2 (two) times daily. Neoral brand  diphenhydrAMINE (BENADRYL) 25 mg capsule Take 50 mg by mouth at bedtime as needed for itching or sleep.  epoetin alfa (EPOGEN,PROCRIT) 29562 UNIT/ML injection Inject 40,000 Units into the skin every 21 ( twenty-one) days. If hemoglobin is below 12  furosemide (LASIX) 80 MG tablet 160 mg 3 (three) times daily.   HYDROmorphone (DILAUDID) 2 MG tablet Take 2 mg by mouth 4 (four) times daily as needed. For pain  labetalol (NORMODYNE) 100 MG tablet Take 300 mg by mouth 2 (two) times daily. Two Tabs twice daily  levothyroxine (SYNTHROID, LEVOTHROID) 88 MCG tablet Take 88 mcg by mouth daily.  loratadine (CLARITIN) 10 MG tablet Take 10 mg by mouth daily.  metoCLOPramide (REGLAN) 5 MG tablet Take 5 mg by mouth 4 (four) times daily - after meals and at bedtime.  nitroGLYCERIN (NITROSTAT) 0.4 MG SL tablet Place 0.4 mg under the tongue every 5 (five) minutes as needed.  OVER THE COUNTER MEDICATION Take 2 capsules by mouth 3 (three) times daily. Stool softener  predniSONE (DELTASONE) 5 MG tablet Take 5 mg by mouth Daily.  promethazine (PHENERGAN) 25 MG tablet Take 25 mg by  mouth Every 6 hours as needed. For nausea  PROVENTIL HFA 108 (90 BASE) MCG/ACT inhaler Inhale 1 puff into the lungs Every 6 hours as needed. For shortness of breath  ranitidine (ZANTAC) 150 MG tablet Take 1 tablet (150 mg total) by mouth 2 (two) times daily.  rOPINIRole (REQUIP) 1 MG tablet Take 1 mg by mouth Every evening. Take at [place] 1 MG injection Ad lib.  Insulin Human (INSULIN PUMP) 100 unit/ml SOLN Inject into the skin continuous.  Humalog     History   Social History  . Marital Status: Married    Spouse Name: N/A    Number of Children: N/A  . Years of Education: N/A   Occupational History  . Disabled    Social History Main Topics  . Smoking status: Never Smoker   . Smokeless tobacco: Never Used  . Alcohol Use: No     Comment: occasionally 1xmonth  . Drug Use: No  . Sexually Active: Not on file   Other Topics Concern  . Not on file   Social History Narrative   Lives @ home with his wife in [place].     Family Status  Relation Status Death Age  . Father Alive   . Mother Alive    Family History  Problem Relation Age of Onset  . Malignant hyperthermia Neg Hx   . Colon polyps Father   . Diabetes Father   . Cancer Father   . Breast cancer Maternal Aunt   . Cancer Maternal Aunt     Breast and Bone     ROS: Chronic MS aches/pains. Has UNNA boot on RLE for non-healing wounds. was on HD last year for a while but has not been on it in over 6 months. No recent illnesses/fevers or chills. Full 14 point review of systems complete and found to be negative unless listed above.  Physical Exam: Blood pressure 168/77, pulse 83, temperature 97.7 F (36.5 C), temperature source Oral, resp. rate 18, height 5\' 8"  (1.727 m), weight 269 lb 9.6 oz (122.29 kg), SpO2 96.00%.  General: Well developed, well nourished, obese male in no acute distress Head: Eyes PERRLA, No xanthomas.   Normocephalic and atraumatic, oropharynx without edema or exudate. Dentition:  good Lungs: few rales bases, otherwise clear. Heart: HRRR S1 S2, no rub/gallop, No signficant murmur. pulses are 2+ LUE, Fistula in RUE with thrill, s/p LLE BKA and has UNNA boot on RLE    Neck: No carotid bruits. No lymphadenopathy.  JVD not elevated. Abdomen: Bowel sounds present, abdomen soft and non-tender without masses or hernias noted. Msk:  No spine or cva tenderness. No weakness, no joint deformities or effusions. Extremities: No clubbing or cyanosis. No edema.  Neuro: Alert and oriented X 3. No focal deficits noted. Psych:  Good affect, responds appropriately Skin: No rashes or lesions noted.  Labs:   Lab Results  Component Value Date   WBC 8.8 06/18/2012   HGB 10.9* 06/18/2012   HCT 33.2* 06/18/2012   MCV 94.9 06/18/2012   PLT 127* 06/18/2012    Recent Labs Lab 06/18/12 0256  NA 133*  K 3.6  CL 96  CO2 25  BUN 98*  CREATININE 5.29*  CALCIUM 9.8  GLUCOSE 185*    Recent Labs  06/18/12 0256  TROPONINI <0.30    Recent Labs  06/17/12 2104  TROPIPOC 0.01   Echo: 08/27/2011 Conclusions - Left ventricle: The cavity size was normal. Wall thickness was increased in a pattern of moderate LVH. Systolic function was normal. The estimated ejection fraction was in the range of 55% to 60%. Wall motion was normal; there were no regional wall motion abnormalities. Doppler parameters are consistent with abnormal left ventricular relaxation (grade 1 diastolic dysfunction). - Mitral valve: Calcified annulus. - Left atrium: The atrium was mildly dilated. - Pulmonary arteries: Systolic pressure was mildly increased. PA peak pressure: 101mm Hg (S). - Pericardium, extracardiac: A moderate pericardial effusion was identified. There was mildright atrial chamber collapse.  ECG: 17-Jun-2012  20:50:50 [facility]/ED ROUTINE RECORD Normal sinus rhythm Normal ECG 79mm/s 76mm/mV 100Hz  8.0.1 12SL 241 HD CID: 4 Referred by: Confirmed By: Lajean Saver MD Vent. rate  85 BPM PR interval 146 ms QRS duration 96 ms QT/QTc 398/473 ms P-R-T axes 25 50 28  Radiology:  Dg Chest Portable 1 View 06/17/2012  *RADIOLOGY REPORT*  Clinical Data: Mid chest pain and shortness of breath.  PORTABLE CHEST - 1 VIEW  Comparison: 09/01/2011  Findings: Slightly shallow inspiration.  Borderline heart size and pulmonary vascularity are likely normal for technique.  No focal consolidation or airspace disease in the lungs.  No blunting of costophrenic angles.  No pneumothorax.  Mediastinal contours appear intact.  Interval removal of right central venous catheter. Otherwise no significant change.  IMPRESSION: No evidence of active pulmonary disease.   Original Report Authenticated By: Lucienne Capers, M.D.     ASSESSMENT AND PLAN:   The patient was seen today by Dr [name], the patient evaluated and the data reviewed.  Principal Problem:   Chest pain - He has multiple cardiac risk factors but symptoms were not that typical and he has no history of exertional chest pain. Consider medical management with changing his BB to Coreg 6.25 (up-titrate as needed) and adding amlodipine 5 mg. Consider nitrates if has further episodes. Can also do stress test, plan on cath if high risk but cath has significant risk of dialysis so would defer stress test unless multiple episodes of pain or pt would/could have cath.  Otherwise, per primary MD Active Problems:   CKD (chronic kidney disease)   CHF (congestive heart failure)   Diabetes mellitus   Signed: Rosaria Ferries, PA 06/18/2012, 11:27 AM Co-Sign MD  I have personally seen and examined this patient with Rosaria Ferries, PA-C. I agree with the assessment and plan as outlined above. He has atypical chest pain. He does have risk factors for CAD including long standing DM and HTN. Diagnostic options which at this point limited to stress myoview since cardiac cath would be detrimental to his transplanted kidney. We have discussed this with the  patient. For now, will continue with medical management. Will adjust meds for better BP control. The addition of Coreg and Norvasc should provide some anti-anginal effect as well. If he has recurrent episodes of chest pain, would have to consider stress testing. A positive stress test would put [place] in the position to proceed with cardiac cath. He would have to agree to the possibility of restarting dialysis before cardiac cath would be arranged. We will follow him in our office as well.   Antha Niday 12:35 PM 06/18/2012

## 2012-06-18 NOTE — H&P (Addendum)
[facility] History and Physical  Dale [name] [id number] DOB: 26-Sep-1965 DOA: 06/17/2012  Referring physician: Dr. Marnette Burgess. PCP: Windy Kalata, MD  Specialists: None.  Chief Complaint: Chest pain.  HPI: Dale [name] is a 47 y.o. male a history of renal transplant on immunosuppressants, diabetes mellitus type 1 on insulin pump, hypothyroidism presented to the year because of chest pain. Patient's symptoms started last evening around 7:30 PM when he developed left arm discomfort while watching television. After half an hour patient started developing chest pain retrosternal. The chest pain lasted for around half an hour and was relieved with nitroglycerin sublingual. It was not associated with any shortness of breath or any productive cough. Since patient's left arm discomfort persisted patient came to the ER. EKG cardiac enzymes and chest x-ray were unremarkable. Patient admitted for further management. At this time patient is chest pain-free.  Review of Systems: The patient denies anorexia, fever, weight loss,, vision loss, decreased hearing, hoarseness, syncope, dyspnea on exertion, peripheral edema, balance deficits, hemoptysis, abdominal pain, melena, hematochezia, severe indigestion/heartburn, hematuria, incontinence, genital sores, muscle weakness, suspicious skin lesions, transient blindness, difficulty walking, depression, unusual weight change, abnormal bleeding, enlarged lymph nodes, angioedema, and breast masses. Has chest pain.  Past Medical History  Diagnosis Date  . Insulin dependent diabetes mellitus   . Hypertension   . History of blood transfusion   . ESRD (end stage renal disease)     a. 1995 s/p cadaveric transplant w/ susbequent failure after 18 yrs;  b.Dialysis initiated 07/2011  . Dyspnea     a. 07/23/11 Echo: EF 55-60%  . Anemia of chronic disease   . Cholecystitis     a. 08/27/2011  . Constipation   . Pericardial effusion     a.  Small by CT 08/22/11;  b.   Large by CT 08/27/11  . Inguinal lymphadenopathy     a. bilateral - s/p biopsy 07/2011   Past Surgical History  Procedure Laterality Date  . Appendectomy    . Hernia repair    . Kidney transplant    . Cataract extraction    . Arteriovenous graft placement    . Av fistula placement    . Toe amputation    . Below knee leg amputation    . Carpal tunnel release    . Shoulder surgery    . Av fistula placement  07/29/2011    Procedure: ARTERIOVENOUS (AV) FISTULA CREATION;  Surgeon: Mal Misty, MD;  Location: [place];  Service: Vascular;  Laterality: Right;  Brachial cephalic  . Insertion of dialysis catheter  08/01/2011    Procedure: INSERTION OF DIALYSIS CATHETER;  Surgeon: Rosetta Posner, MD;  Location: [place];  Service: Vascular;  Laterality: Right;  insertion of dialysis catheter on right internal jugular vein  . Lymph node biopsy  08/10/2011    Procedure: LYMPH NODE BIOPSY;  Surgeon: Merrie Roof, MD;  Location: [place];  Service: General;  Laterality: Left;  left groin lymph biopsy  . Colonoscopy  08/29/2011    Procedure: COLONOSCOPY;  Surgeon: Jerene Bears, MD;  Location: [place];  Service: Gastroenterology;  Laterality: N/A;   Social History:  reports that he has never smoked. He has never used smokeless tobacco. He reports that he does not drink alcohol or use illicit drugs. Lives at home. where does patient live--home, ALF, SNF? and with whom if at home? Yes. Can patient participate in ADLs?  Allergies  Allergen Reactions  . Morphine And Related Rash  nausea  . Amoxicillin Other (See Comments)    Reaction unknown  . Ciprofloxacin Other (See Comments)    Reaction unknown  . Clindamycin/Lincomycin Other (See Comments)    Reaction unknown  . Codeine Other (See Comments)    Reaction unknown. But pt has tolerated hydrocodone in Vicodin and Norco in the past  . Levofloxacin Other (See Comments)    Reaction unknown  . Vasotec Cough  . Oxycodone Rash    Family History   Problem Relation Age of Onset  . Malignant hyperthermia Neg Hx   . Colon polyps Father   . Diabetes Father   . Cancer Father   . Breast cancer Maternal Aunt   . Cancer Maternal Aunt     Breast and Bone      Prior to Admission medications   Medication Sig Start Date End Date Taking? Authorizing Provider  allopurinol (ZYLOPRIM) 100 MG tablet daily. 2 Tabs 03/14/12  Yes Historical Provider, MD  aspirin 81 MG tablet Take 81 mg by mouth every evening.    Yes Historical Provider, MD  calcium acetate (PHOSLO) 667 MG capsule Take 1,334-2,001 mg by mouth 5 (five) times daily. 2001 mg with meals and 1334 mg with snacks 08/13/11 08/12/12 Yes Nita Sells, MD  cycloSPORINE (SANDIMMUNE) 25 MG capsule Take 75 mg by mouth 2 (two) times daily.   Yes Historical Provider, MD  cycloSPORINE modified (NEORAL/GENGRAF) 100 MG capsule Take 100 mg by mouth 2 (two) times daily. Neoral brand 06/26/11  Yes Historical Provider, MD  diphenhydrAMINE (BENADRYL) 25 mg capsule Take 50 mg by mouth at bedtime as needed for itching or sleep.   Yes Historical Provider, MD  epoetin alfa (EPOGEN,PROCRIT) 91478 UNIT/ML injection Inject 40,000 Units into the skin every 21 ( twenty-one) days. If hemoglobin is below 12   Yes Historical Provider, MD  furosemide (LASIX) 80 MG tablet 160 mg 3 (three) times daily.  02/09/12  Yes Historical Provider, MD  HYDROmorphone (DILAUDID) 2 MG tablet Take 2 mg by mouth 4 (four) times daily as needed. For pain 02/26/12  Yes Historical Provider, MD  labetalol (NORMODYNE) 100 MG tablet Take 300 mg by mouth 2 (two) times daily. Two Tabs twice daily 09/06/11 09/05/12 Yes [name], MD  levothyroxine (SYNTHROID, LEVOTHROID) 88 MCG tablet Take 88 mcg by mouth daily.   Yes Historical Provider, MD  loratadine (CLARITIN) 10 MG tablet Take 10 mg by mouth daily.   Yes Historical Provider, MD  metoCLOPramide (REGLAN) 5 MG tablet Take 5 mg by mouth 4 (four) times daily - after meals and at bedtime.  05/26/11  Yes Historical Provider, MD  nitroGLYCERIN (NITROSTAT) 0.4 MG SL tablet Place 0.4 mg under the tongue every 5 (five) minutes as needed. 09/06/11 09/05/12 Yes [name], MD  OVER THE COUNTER MEDICATION Take 2 capsules by mouth 3 (three) times daily. Stool softener   Yes Historical Provider, MD  predniSONE (DELTASONE) 5 MG tablet Take 5 mg by mouth Daily. 06/26/11  Yes Historical Provider, MD  promethazine (PHENERGAN) 25 MG tablet Take 25 mg by mouth Every 6 hours as needed. For nausea 07/13/11  Yes Historical Provider, MD  PROVENTIL HFA 108 (90 BASE) MCG/ACT inhaler Inhale 1 puff into the lungs Every 6 hours as needed. For shortness of breath 05/17/11  Yes Historical Provider, MD  ranitidine (ZANTAC) 150 MG tablet Take 1 tablet (150 mg total) by mouth 2 (two) times daily. 03/26/12 03/26/13 Yes Jerene Bears, MD  rOPINIRole (REQUIP) 1 MG tablet Take  1 mg by mouth Every evening. Take at 2000 07/18/11  Yes Historical Provider, MD  GLUCAGON EMERGENCY 1 MG injection Ad lib. 01/19/12   Historical Provider, MD  Insulin Human (INSULIN PUMP) 100 unit/ml SOLN Inject into the skin continuous. Humalog    Historical Provider, MD   Physical Exam: Filed Vitals:   06/18/12 0000 06/18/12 0100 06/18/12 0200 06/18/12 0231  BP: 157/73 165/72 165/77 160/80  Pulse: 81 80 83 82  Temp:    97.8 F (36.6 C)  TempSrc:    Oral  Resp:    18  Height:    5\' 8"  (1.727 m)  Weight:    122.29 kg (269 lb 9.6 oz)  SpO2: 100% 100% 97% 96%     General:  Well-developed well-nourished.  Eyes: Anicteric. No pallor.  ENT: No discharge from ears eyes nose and mouth.  Neck: No mass felt.  Cardiovascular: S1-S2 heard.  Respiratory: No rhonchi no crepitations.  Abdomen: Soft nontender bowel sounds present.  Skin: No rash.  Musculoskeletal: Left BKA.  Psychiatric: Appears normal.  Neurologic: Moves all extremities.  Labs on Admission:  Basic Metabolic Panel:  Recent Labs Lab 06/17/12 2055  NA 136  K 3.9   CL 98  CO2 26  GLUCOSE 235*  BUN 98*  CREATININE 5.39*  CALCIUM 9.9   Liver Function Tests: No results found for this basename: AST, ALT, ALKPHOS, BILITOT, PROT, ALBUMIN,  in the last 168 hours No results found for this basename: LIPASE, AMYLASE,  in the last 168 hours No results found for this basename: AMMONIA,  in the last 168 hours CBC:  Recent Labs Lab 06/17/12 2055  WBC 8.8  HGB 11.1*  HCT 33.6*  MCV 96.0  PLT 206   Cardiac Enzymes: No results found for this basename: CKTOTAL, CKMB, CKMBINDEX, TROPONINI,  in the last 168 hours  BNP (last 3 results) No results found for this basename: PROBNP,  in the last 8760 hours CBG:  Recent Labs Lab 06/18/12 0252  GLUCAP 163*    Radiological Exams on Admission: Dg Chest Portable 1 View  06/17/2012  *RADIOLOGY REPORT*  Clinical Data: Mid chest pain and shortness of breath.  PORTABLE CHEST - 1 VIEW  Comparison: 09/01/2011  Findings: Slightly shallow inspiration.  Borderline heart size and pulmonary vascularity are likely normal for technique.  No focal consolidation or airspace disease in the lungs.  No blunting of costophrenic angles.  No pneumothorax.  Mediastinal contours appear intact.  Interval removal of right central venous catheter. Otherwise no significant change.  IMPRESSION: No evidence of active pulmonary disease.   Original Report Authenticated By: Lucienne Capers, M.D.     EKG: Independently reviewed. Normal sinus rhythm.  Assessment/Plan Principal Problem:   Chest pain Active Problems:   CKD (chronic kidney disease)   CHF (congestive heart failure)   Diabetes mellitus   1. Chest pain - presently chest pain-free. Given patient's risk factors we will cycle cardiac markers. Patient's EKG does not show any features of tamponade or pericarditis. Check 2-D echo as 2-D echo done last year showed moderate peripheral effusion.  2. Chronic kidney disease in transplanted kidney - patient states his creatinine is  around 5. Today it is mildly elevated that that. Continue immunosuppressants. Closely follow metabolic panel and intake output. Any further worsening consult nephrology. Check UA. 3. Diabetes mellitus type 1 on insulin pump - continue insulin pump per protocol. 4. Hypothyroidism - continue Synthroid. Check TSH. 5.   Gout - continue allopurinol. 6.  Hypertension - continue present medications. 7.   History of OSA.    Code Status:  full code. Family Communication:  wife at the bedside.  Disposition Plan:  admit to observation.   Skippy Marhefka N. [facility] Pager [phone].  If 7PM-7AM, please contact night-coverage [email or web address] Password [facility] 06/18/2012, 3:08 AM

## 2012-06-18 NOTE — Consult Note (Signed)
WOC consult Note Reason for Consult: Consult requested for right leg dressing.  Pt is followed as an outpatient by an ortho physician and pt states he has an IT trainer and coban applied Q 1-2 weeks.  This dressing is not due to be changed at this time and pt requests that it be left in place until he is discharged and his physician can assess in the office later this week.   Wound type: Full thickness Dressing procedure/placement/frequency: Una boot intact to RLE without odor or drainage.  No change needed at this time.  If pt stays in hospital longer than anticipated, then please re-consult [place] and dressing can be changed at that time. Will not plan to follow further unless re-consulted.  [address] [place], MSN, [place]

## 2012-06-19 LAB — CBC
HCT: 32.4 % — ABNORMAL LOW (ref 39.0–52.0)
Hemoglobin: 11 g/dL — ABNORMAL LOW (ref 13.0–17.0)
MCH: 32.4 pg (ref 26.0–34.0)
MCHC: 34 g/dL (ref 30.0–36.0)
MCV: 95.3 fL (ref 78.0–100.0)
Platelets: 158 10*3/uL (ref 150–400)
RBC: 3.4 MIL/uL — ABNORMAL LOW (ref 4.22–5.81)
RDW: 15 % (ref 11.5–15.5)
WBC: 8.9 10*3/uL (ref 4.0–10.5)

## 2012-06-19 LAB — BASIC METABOLIC PANEL
BUN: 101 mg/dL — ABNORMAL HIGH (ref 6–23)
CO2: 23 mEq/L (ref 19–32)
Calcium: 10.1 mg/dL (ref 8.4–10.5)
Chloride: 95 mEq/L — ABNORMAL LOW (ref 96–112)
Creatinine, Ser: 5.78 mg/dL — ABNORMAL HIGH (ref 0.50–1.35)
GFR calc Af Amer: 12 mL/min — ABNORMAL LOW (ref >90)
GFR calc non Af Amer: 11 mL/min — ABNORMAL LOW (ref >90)
Glucose, Bld: 205 mg/dL — ABNORMAL HIGH (ref 70–99)
Potassium: 4.2 mEq/L (ref 3.5–5.1)
Sodium: 135 mEq/L (ref 135–145)

## 2012-06-19 LAB — GLUCOSE, CAPILLARY
Glucose-Capillary: 152 mg/dL — ABNORMAL HIGH (ref 70–99)
Glucose-Capillary: 184 mg/dL — ABNORMAL HIGH (ref 70–99)
Glucose-Capillary: 127 mg/dL — ABNORMAL HIGH (ref 70–99)

## 2012-06-19 MED ORDER — CARVEDILOL 6.25 MG PO TABS: 6.2500 mg | ORAL_TABLET | Freq: Two times a day (BID) | ORAL | Status: DC

## 2012-06-19 MED ORDER — AMLODIPINE BESYLATE 5 MG PO TABS: 5.0000 mg | ORAL_TABLET | Freq: Every day | ORAL | Status: DC

## 2012-06-19 NOTE — Discharge Summary (Signed)
Physician Discharge Summary  Dale [name] [id number] DOB: 05-29-1965 DOA: 06/17/2012  PCP: Windy Kalata, MD  Admit date: 06/17/2012 Discharge date: 06/19/2012  Time spent: greater than 30 minutes  Recommendations for Outpatient Follow-up:  1. With Dr. Lauree Chandler, Cardiology on 07/04/2012 at 11:00 am. 2. With Dr. Fleet Contras, Nephrology on 06/21/2012 for repeat labs [facility])  Discharge Diagnoses:  Principal Problem:   Chest pain Active Problems:   CKD (chronic kidney disease)   CHF (congestive heart failure)   Diabetes mellitus   Discharge Condition: Improved & Stable  Diet recommendation: Renal & Diabetic diet.  Filed Weights   06/18/12 0231 06/18/12 0554  Weight: 122.29 kg (269 lb 9.6 oz) 122.29 kg (269 lb 9.6 oz)    History of present illness:  47 year old male with history of DM 1 on insulin pump, ESRD/cadaveric renal transplant 1995 on immunosuppressants, off dialysis since July 2013, HTN, OSA-unable to tolerate CPAP, fibromyalgia, anemia, left BKA was admitted on 06/18/2012 with complaints of chest pain and left upper extremity pain which started at 7:30 PM on 2/23. He initially noticed pain starting in the left shoulder and radiating down to the left upper extremity which was not worsened by limb movements. Half an hour later he noticed lower substernal pressure type of chest pain rated as 8/10 in severity. He took 2 sublingual nitroglycerin which eventually relieved pain. Stress test done in [place] approximately 1-1/2 years ago said to be negative. Admitted for further evaluation and management.   Hospital Course:  1. Chest pain and left upper extremity pain, suspicious for angina: Patient has multiple CAD risk factors-DM 1, HTN, obesity, OSA & CKD. Chest pain and left upper extremity pain have resolved. Ruled out for MI. Cardiology was consulted and they indicated that patients chest pain was atypical. They recommended that diagnostic options at this  point are limited to stress myoview since cardiac cath would be detrimental to his transplanted kidney. For now, medical management was recommended. Medications were adjusted for better BP control. The addition of Coreg and Norvasc should provide some anti-anginal effect as well. If he has recurrent episodes of chest pain, would have to consider stress testing. A positive stress test would put [place] in the position to proceed with cardiac cath. He would have to agree to the possibility of restarting dialysis before cardiac cath would be arranged. Cardiology will follow him in our office as well.   2. 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 Discussed with Nephrologist on call on day of discharge who suggested that patients creatinine was most likely close to baseline (reviewed OP records). He recommended OP follow up with Nephrology in 2 days with repeat BMP. Continue Cyclosporin & Prednisone. Continue Lasix. 3. Type 1 DM: Reasonable in patient control. Continue Insulin pump. 4. HTN: Mildly uncontrolled. Medications were adjusted as below. 5. OSA/Morbid Obesity: has not tolerated OSA as OP. 6. Hypothyroid: Continue Synthroid. 7. Anemia, of CKD: Stable. Continue Aranesp. 8. H/O Fibromyalgia: complaining of Right hip pain. No falls. 9. Thrombocytopenia: resolved.   Procedures:  None   Consultations:  [facility]  Discharge Exam:  Complaints: No chest pain or left upper extremity pain.  Filed Vitals:   06/18/12 0554 06/18/12 1500 06/18/12 2200 06/19/12 0626  BP: 168/77 149/83 149/83 127/78  Pulse: 83 81 81 79  Temp: 97.7 F (36.5 C) 98.1 F (36.7 C) 97.9 F (36.6 C) 98.2 F (36.8 C)  TempSrc: Oral Oral Oral Oral  Resp: 18 18  19  Height:      Weight: 122.29 kg (269 lb 9.6 oz)     SpO2: 96% 100% 93% 97%    General exam: Comfortable. Morbidly obese. Sitting at edge of bed.  Respiratory system: Clear. No increased work of breathing.   Cardiovascular system: S1 & S2 heard, RRR. No JVD, murmurs, gallops, clicks or pedal edema.Telemetry: SR  Gastrointestinal system: Abdomen is obese/ nondistended, soft and nontender. Normal bowel sounds heard.  Central nervous system: Alert and oriented. No focal neurological deficits.  Extremities: Left BKA- has prosthesis. Rightl Leg in ACE wrap (seen by Dr. Dial)-C/D/I. Symmetric 5/5 power.   Discharge Instructions      Discharge Orders   Future Appointments Provider Department Dept Phone   06/22/2012 3:45 PM [facility] [place] [phone]   07/04/2012 11:00 AM Burnell Blanks, MD [place]) [phone]   Future Orders Complete By Expires     ([place]) Call MD:  Anytime you have any of the following symptoms: 1) 3 pound weight gain in 24 hours or 5 pounds in 1 week 2) shortness of breath, with or without a dry hacking cough 3) swelling in the hands, feet or stomach 4) if you have to sleep on extra pillows at night in order to breathe.  As directed     Call MD for:  difficulty breathing, headache or visual disturbances  As directed     Call MD for:  severe uncontrolled pain  As directed     Discharge instructions  As directed     Comments:      DIET: Renal and Diabetic diet.    Increase activity slowly  As directed         Medication List    STOP taking these medications       labetalol 100 MG tablet  Commonly known as:  NORMODYNE      TAKE these medications       allopurinol 100 MG tablet  Commonly known as:  ZYLOPRIM  daily. 2 Tabs     amLODipine 5 MG tablet  Commonly known as:  NORVASC  Take 1 tablet (5 mg total) by mouth daily.     aspirin 81 MG tablet  Take 81 mg by mouth every evening.     calcium acetate 667 MG capsule  Commonly known as:  PHOSLO  Take 1,334-2,001 mg by mouth 5 (five) times daily. 2001 mg with meals and 1334 mg with snacks     carvedilol  6.25 MG tablet  Commonly known as:  COREG  Take 1 tablet (6.25 mg total) by mouth 2 (two) times daily with a meal.     cycloSPORINE 25 MG capsule  Commonly known as:  SANDIMMUNE  Take 75 mg by mouth 2 (two) times daily.     cycloSPORINE modified 100 MG capsule  Commonly known as:  NEORAL  Take 100 mg by mouth 2 (two) times daily. Neoral brand     diphenhydrAMINE 25 mg capsule  Commonly known as:  BENADRYL  Take 50 mg by mouth at bedtime as needed for itching or sleep.     epoetin alfa 40000 UNIT/ML injection  Commonly known as:  EPOGEN,PROCRIT  Inject 40,000 Units into the skin every 21 ( twenty-one) days. If hemoglobin is below 12     furosemide 80 MG tablet  Commonly known as:  LASIX  160 mg 3 (three) times daily.     GLUCAGON EMERGENCY 1  MG injection  Generic drug:  glucagon  Ad lib.     HYDROmorphone 2 MG tablet  Commonly known as:  DILAUDID  Take 2 mg by mouth 4 (four) times daily as needed. For pain     insulin pump 100 unit/ml Soln  Inject into the skin continuous. Humalog     levothyroxine 88 MCG tablet  Commonly known as:  SYNTHROID, LEVOTHROID  Take 88 mcg by mouth daily.     loratadine 10 MG tablet  Commonly known as:  CLARITIN  Take 10 mg by mouth daily.     metoCLOPramide 5 MG tablet  Commonly known as:  REGLAN  Take 5 mg by mouth 4 (four) times daily - after meals and at bedtime.     nitroGLYCERIN 0.4 MG SL tablet  Commonly known as:  NITROSTAT  Place 0.4 mg under the tongue every 5 (five) minutes as needed.     OVER THE COUNTER MEDICATION  Take 2 capsules by mouth 3 (three) times daily. Stool softener     predniSONE 5 MG tablet  Commonly known as:  DELTASONE  Take 5 mg by mouth Daily.     promethazine 25 MG tablet  Commonly known as:  PHENERGAN  Take 25 mg by mouth Every 6 hours as needed. For nausea     PROVENTIL HFA 108 (90 BASE) MCG/ACT inhaler  Generic drug:  albuterol  Inhale 1 puff into the lungs Every 6 hours as needed. For  shortness of breath     ranitidine 150 MG tablet  Commonly known as:  ZANTAC  Take 1 tablet (150 mg total) by mouth 2 (two) times daily.     rOPINIRole 1 MG tablet  Commonly known as:  REQUIP  Take 1 mg by mouth Every evening. Take at [place]   Follow up with [facility], MD On 07/04/2012. (at 11:00 am)    Contact information:   [address]  [place] [address] [phone]       Follow up with Windy Kalata, MD On 06/21/2012. (To be seen for repeat labs (BMP))    Contact information:   [place] [address] [phone]        The results of significant diagnostics from this hospitalization (including imaging, microbiology, ancillary and laboratory) are listed below for reference.    Significant Diagnostic Studies: Dg Chest Portable 1 View  06/17/2012  *RADIOLOGY REPORT*  Clinical Data: Mid chest pain and shortness of breath.  PORTABLE CHEST - 1 VIEW  Comparison: 09/01/2011  Findings: Slightly shallow inspiration.  Borderline heart size and pulmonary vascularity are likely normal for technique.  No focal consolidation or airspace disease in the lungs.  No blunting of costophrenic angles.  No pneumothorax.  Mediastinal contours appear intact.  Interval removal of right central venous catheter. Otherwise no significant change.  IMPRESSION: No evidence of active pulmonary disease.   Original Report Authenticated By: Lucienne Capers, M.D.     Microbiology: Recent Results (from the past 240 hour(s))  MRSA PCR SCREENING     Status: None   Collection Time    06/18/12  3:18 AM      Result Value Range Status   MRSA by PCR NEGATIVE  NEGATIVE Final   Comment:            The GeneXpert MRSA Assay (FDA     approved for NASAL specimens     only), is one component of a  comprehensive MRSA colonization     surveillance program. It is not     intended to diagnose MRSA     infection nor to guide or     monitor  treatment for     MRSA infections.     Labs: Basic Metabolic Panel:  Recent Labs Lab 06/17/12 2055 06/18/12 0256 06/19/12 0550  NA 136 133* 135  K 3.9 3.6 4.2  CL 98 96 95*  CO2 26 25 23   GLUCOSE 235* 185* 205*  BUN 98* 98* 101*  CREATININE 5.39* 5.29* 5.78*  CALCIUM 9.9 9.8 10.1   Liver Function Tests: No results found for this basename: AST, ALT, ALKPHOS, BILITOT, PROT, ALBUMIN,  in the last 168 hours No results found for this basename: LIPASE, AMYLASE,  in the last 168 hours No results found for this basename: AMMONIA,  in the last 168 hours CBC:  Recent Labs Lab 06/17/12 2055 06/18/12 0256 06/18/12 1007 06/19/12 0550  WBC 8.8 8.8 9.0 8.9  HGB 11.1* 10.9* 10.9* 11.0*  HCT 33.6* 33.2* 32.3* 32.4*  MCV 96.0 94.9 94.7 95.3  PLT 206 127* 128* 158   Cardiac Enzymes:  Recent Labs Lab 06/18/12 0256 06/18/12 1007 06/18/12 1438  TROPONINI <0.30 <0.30 <0.30   BNP: BNP (last 3 results) No results found for this basename: PROBNP,  in the last 8760 hours CBG:  Recent Labs Lab 06/18/12 1206 06/18/12 1646 06/18/12 2143 06/19/12 0145 06/19/12 0754  GLUCAP 207* 272* 81 152* 184*    Additional labs:  2 D Echo 06/18/2012  Study Conclusions  - Left ventricle: The cavity size was normal. Wall thickness was increased in a pattern of moderate LVH. Systolic function was normal. The estimated ejection fraction was in the range of 55% to 60%. - Mitral valve: Calcified annulus. - Atrial septum: No defect or patent foramen ovale was identified.     Signed:  Tajanay Hurley  [facility] 06/19/2012, 11:58 AM

## 2012-06-19 NOTE — Progress Notes (Signed)
   Subjective:  Denies CP or dyspnea   Objective:  Filed Vitals:   06/18/12 0554 06/18/12 1500 06/18/12 2200 06/19/12 0626  BP: 168/77 149/83 149/83 127/78  Pulse: 83 81 81 79  Temp: 97.7 F (36.5 C) 98.1 F (36.7 C) 97.9 F (36.6 C) 98.2 F (36.8 C)  TempSrc: Oral Oral Oral Oral  Resp: 18 18  19   Height:      Weight: 269 lb 9.6 oz (122.29 kg)     SpO2: 96% 100% 93% 97%    Intake/Output from previous day:  Intake/Output Summary (Last 24 hours) at 06/19/12 1152 Last data filed at 06/19/12 0919  Gross per 24 hour  Intake    363 ml  Output   1175 ml  Net   -812 ml    Physical Exam: Physical exam: Well-developed obese in no acute distress.  Skin is warm and dry.  HEENT is normal.  Neck is supple. Chest is clear to auscultation with normal expansion.  Cardiovascular exam is regular rate and rhythm.  Abdominal exam nontender or distended. No masses palpated. Extremities show no edema. S/p AKA neuro grossly intact    Lab Results: Basic Metabolic Panel:  Recent Labs  06/18/12 0256 06/19/12 0550  NA 133* 135  K 3.6 4.2  CL 96 95*  CO2 25 23  GLUCOSE 185* 205*  BUN 98* 101*  CREATININE 5.29* 5.78*  CALCIUM 9.8 10.1   CBC:  Recent Labs  06/18/12 1007 06/19/12 0550  WBC 9.0 8.9  HGB 10.9* 11.0*  HCT 32.3* 32.4*  MCV 94.7 95.3  PLT 128* 158   Cardiac Enzymes:  Recent Labs  06/18/12 0256 06/18/12 1007 06/18/12 1438  TROPONINI <0.30 <0.30 <0.30     Assessment/Plan:  1 CP - as outlined by Dr [name]; plan medical therapy; reserve further ischemia eval for recurrent symptoms 2 chronic renal failure/s/p transplant - per nephrology Patient can be DCed from a cardiac standpoint and fu with Dr [name]. Continue present meds at [place] 06/19/2012, 11:52 AM

## 2012-06-22 ENCOUNTER — Encounter (HOSPITAL_COMMUNITY)
Admission: RE | Admit: 2012-06-22 | Discharge: 2012-06-22 | Disposition: A | Discharge status: Home / Self Care | Payer: Medicare Other | Source: Ambulatory Visit | Attending: Nephrology | Admitting: Nephrology

## 2012-06-22 LAB — POCT HEMOGLOBIN-HEMACUE: Hemoglobin: 11.8 g/dL — ABNORMAL LOW (ref 13.0–17.0)

## 2012-06-22 MED ORDER — EPOETIN ALFA 40000 UNIT/ML IJ SOLN
INTRAMUSCULAR | Status: AC
  Administered 2012-06-22: 40000 [IU] via SUBCUTANEOUS
  Filled 2012-06-22: qty 1

## 2012-06-22 MED ORDER — EPOETIN ALFA 40000 UNIT/ML IJ SOLN
40000.0000 [IU] | INTRAMUSCULAR | Status: DC
  Administered 2012-06-22: 40000 [IU] via SUBCUTANEOUS

## 2012-07-03 ENCOUNTER — Inpatient Hospital Stay (HOSPITAL_COMMUNITY)
Admission: EM | Admit: 2012-07-03 | Discharge: 2012-07-14 | DRG: 871 | Disposition: A | Discharge status: Home / Self Care | Payer: Medicare Other | Attending: Family Medicine | Admitting: Family Medicine

## 2012-07-03 ENCOUNTER — Emergency Department (HOSPITAL_COMMUNITY): Payer: Medicare Other

## 2012-07-03 ENCOUNTER — Encounter (HOSPITAL_COMMUNITY): Payer: Self-pay | Admitting: Emergency Medicine

## 2012-07-03 DIAGNOSIS — A020 Salmonella enteritis: Secondary | ICD-10-CM

## 2012-07-03 DIAGNOSIS — A09 Infectious gastroenteritis and colitis, unspecified: Secondary | ICD-10-CM | POA: Diagnosis not present

## 2012-07-03 DIAGNOSIS — N189 Chronic kidney disease, unspecified: Secondary | ICD-10-CM

## 2012-07-03 DIAGNOSIS — Z992 Dependence on renal dialysis: Secondary | ICD-10-CM

## 2012-07-03 DIAGNOSIS — E039 Hypothyroidism, unspecified: Secondary | ICD-10-CM | POA: Diagnosis present

## 2012-07-03 DIAGNOSIS — D649 Anemia, unspecified: Secondary | ICD-10-CM

## 2012-07-03 DIAGNOSIS — K559 Vascular disorder of intestine, unspecified: Secondary | ICD-10-CM

## 2012-07-03 DIAGNOSIS — R197 Diarrhea, unspecified: Secondary | ICD-10-CM

## 2012-07-03 DIAGNOSIS — D638 Anemia in other chronic diseases classified elsewhere: Secondary | ICD-10-CM | POA: Diagnosis present

## 2012-07-03 DIAGNOSIS — G4733 Obstructive sleep apnea (adult) (pediatric): Secondary | ICD-10-CM

## 2012-07-03 DIAGNOSIS — IMO0001 Reserved for inherently not codable concepts without codable children: Secondary | ICD-10-CM | POA: Diagnosis present

## 2012-07-03 DIAGNOSIS — E876 Hypokalemia: Secondary | ICD-10-CM | POA: Diagnosis present

## 2012-07-03 DIAGNOSIS — E872 Acidosis, unspecified: Secondary | ICD-10-CM

## 2012-07-03 DIAGNOSIS — Z83719 Family history of colon polyps, unspecified: Secondary | ICD-10-CM

## 2012-07-03 DIAGNOSIS — J9600 Acute respiratory failure, unspecified whether with hypoxia or hypercapnia: Secondary | ICD-10-CM

## 2012-07-03 DIAGNOSIS — Z803 Family history of malignant neoplasm of breast: Secondary | ICD-10-CM

## 2012-07-03 DIAGNOSIS — S88119A Complete traumatic amputation at level between knee and ankle, unspecified lower leg, initial encounter: Secondary | ICD-10-CM

## 2012-07-03 DIAGNOSIS — N186 End stage renal disease: Secondary | ICD-10-CM

## 2012-07-03 DIAGNOSIS — E1049 Type 1 diabetes mellitus with other diabetic neurological complication: Secondary | ICD-10-CM | POA: Diagnosis present

## 2012-07-03 DIAGNOSIS — Z89519 Acquired absence of unspecified leg below knee: Secondary | ICD-10-CM

## 2012-07-03 DIAGNOSIS — R7881 Bacteremia: Secondary | ICD-10-CM

## 2012-07-03 DIAGNOSIS — N179 Acute kidney failure, unspecified: Secondary | ICD-10-CM

## 2012-07-03 DIAGNOSIS — Z9089 Acquired absence of other organs: Secondary | ICD-10-CM

## 2012-07-03 DIAGNOSIS — Z8371 Family history of colonic polyps: Secondary | ICD-10-CM

## 2012-07-03 DIAGNOSIS — I120 Hypertensive chronic kidney disease with stage 5 chronic kidney disease or end stage renal disease: Secondary | ICD-10-CM | POA: Diagnosis present

## 2012-07-03 DIAGNOSIS — F40298 Other specified phobia: Secondary | ICD-10-CM | POA: Diagnosis present

## 2012-07-03 DIAGNOSIS — R509 Fever, unspecified: Secondary | ICD-10-CM

## 2012-07-03 DIAGNOSIS — Z6841 Body Mass Index (BMI) 40.0 and over, adult: Secondary | ICD-10-CM

## 2012-07-03 DIAGNOSIS — R6520 Severe sepsis without septic shock: Secondary | ICD-10-CM | POA: Diagnosis present

## 2012-07-03 DIAGNOSIS — Z833 Family history of diabetes mellitus: Secondary | ICD-10-CM

## 2012-07-03 DIAGNOSIS — E119 Type 2 diabetes mellitus without complications: Secondary | ICD-10-CM

## 2012-07-03 DIAGNOSIS — E1121 Type 2 diabetes mellitus with diabetic nephropathy: Secondary | ICD-10-CM | POA: Diagnosis present

## 2012-07-03 DIAGNOSIS — Z9849 Cataract extraction status, unspecified eye: Secondary | ICD-10-CM

## 2012-07-03 DIAGNOSIS — D72829 Elevated white blood cell count, unspecified: Secondary | ICD-10-CM

## 2012-07-03 DIAGNOSIS — Z9641 Presence of insulin pump (external) (internal): Secondary | ICD-10-CM

## 2012-07-03 DIAGNOSIS — R5381 Other malaise: Secondary | ICD-10-CM | POA: Diagnosis present

## 2012-07-03 DIAGNOSIS — K3184 Gastroparesis: Secondary | ICD-10-CM | POA: Diagnosis present

## 2012-07-03 DIAGNOSIS — Z940 Kidney transplant status: Secondary | ICD-10-CM

## 2012-07-03 DIAGNOSIS — IMO0002 Reserved for concepts with insufficient information to code with codable children: Secondary | ICD-10-CM

## 2012-07-03 DIAGNOSIS — K529 Noninfective gastroenteritis and colitis, unspecified: Secondary | ICD-10-CM

## 2012-07-03 DIAGNOSIS — A021 Salmonella sepsis: Principal | ICD-10-CM | POA: Diagnosis present

## 2012-07-03 DIAGNOSIS — R0602 Shortness of breath: Secondary | ICD-10-CM | POA: Diagnosis present

## 2012-07-03 DIAGNOSIS — R110 Nausea: Secondary | ICD-10-CM

## 2012-07-03 DIAGNOSIS — A419 Sepsis, unspecified organism: Secondary | ICD-10-CM

## 2012-07-03 LAB — URINALYSIS, ROUTINE W REFLEX MICROSCOPIC
Bilirubin Urine: NEGATIVE
Glucose, UA: 250 mg/dL — AB
Ketones, ur: NEGATIVE mg/dL
Nitrite: NEGATIVE
Protein, ur: >300 mg/dL — AB
Specific Gravity, Urine: 1.022 (ref 1.005–1.030)
Urobilinogen, UA: 0.2 mg/dL (ref 0.0–1.0)
pH: 5 (ref 5.0–8.0)

## 2012-07-03 LAB — CBC WITH DIFFERENTIAL/PLATELET
Basophils Absolute: 0 10*3/uL (ref 0.0–0.1)
Basophils Relative: 0 % (ref 0–1)
Eosinophils Absolute: 0 10*3/uL (ref 0.0–0.7)
Eosinophils Relative: 0 % (ref 0–5)
HCT: 34.7 % — ABNORMAL LOW (ref 39.0–52.0)
Hemoglobin: 11.4 g/dL — ABNORMAL LOW (ref 13.0–17.0)
Lymphocytes Relative: 5 % — ABNORMAL LOW (ref 12–46)
Lymphs Abs: 0.9 10*3/uL (ref 0.7–4.0)
MCH: 31.9 pg (ref 26.0–34.0)
MCHC: 32.9 g/dL (ref 30.0–36.0)
MCV: 97.2 fL (ref 78.0–100.0)
Monocytes Absolute: 0.5 10*3/uL (ref 0.1–1.0)
Monocytes Relative: 3 % (ref 3–12)
Neutro Abs: 17 10*3/uL — ABNORMAL HIGH (ref 1.7–7.7)
Neutrophils Relative %: 92 % — ABNORMAL HIGH (ref 43–77)
Platelets: 149 10*3/uL — ABNORMAL LOW (ref 150–400)
RBC: 3.57 MIL/uL — ABNORMAL LOW (ref 4.22–5.81)
RDW: 15.7 % — ABNORMAL HIGH (ref 11.5–15.5)
WBC: 18.4 10*3/uL — ABNORMAL HIGH (ref 4.0–10.5)

## 2012-07-03 LAB — COMPREHENSIVE METABOLIC PANEL
ALT: 10 U/L (ref 0–53)
AST: 19 U/L (ref 0–37)
Albumin: 2.3 g/dL — ABNORMAL LOW (ref 3.5–5.2)
Alkaline Phosphatase: 67 U/L (ref 39–117)
BUN: 110 mg/dL — ABNORMAL HIGH (ref 6–23)
CO2: 18 mEq/L — ABNORMAL LOW (ref 19–32)
Calcium: 8.9 mg/dL (ref 8.4–10.5)
Chloride: 96 mEq/L (ref 96–112)
Creatinine, Ser: 6.6 mg/dL — ABNORMAL HIGH (ref 0.50–1.35)
GFR calc Af Amer: 10 mL/min — ABNORMAL LOW (ref >90)
GFR calc non Af Amer: 9 mL/min — ABNORMAL LOW (ref >90)
Glucose, Bld: 188 mg/dL — ABNORMAL HIGH (ref 70–99)
Potassium: 3.8 mEq/L (ref 3.5–5.1)
Sodium: 132 mEq/L — ABNORMAL LOW (ref 135–145)
Total Bilirubin: 0.4 mg/dL (ref 0.3–1.2)
Total Protein: 7.3 g/dL (ref 6.0–8.3)

## 2012-07-03 LAB — CG4 I-STAT (LACTIC ACID): Lactic Acid, Venous: 2.73 mmol/L — ABNORMAL HIGH (ref 0.5–2.2)

## 2012-07-03 LAB — URINE MICROSCOPIC-ADD ON

## 2012-07-03 LAB — GLUCOSE, CAPILLARY: Glucose-Capillary: 205 mg/dL — ABNORMAL HIGH (ref 70–99)

## 2012-07-03 LAB — CBC
HCT: 34.1 % — ABNORMAL LOW (ref 39.0–52.0)
Hemoglobin: 11.2 g/dL — ABNORMAL LOW (ref 13.0–17.0)
MCH: 31.4 pg (ref 26.0–34.0)
MCHC: 32.8 g/dL (ref 30.0–36.0)
MCV: 95.5 fL (ref 78.0–100.0)
Platelets: 122 10*3/uL — ABNORMAL LOW (ref 150–400)
RBC: 3.57 MIL/uL — ABNORMAL LOW (ref 4.22–5.81)
RDW: 15.9 % — ABNORMAL HIGH (ref 11.5–15.5)
WBC: 12.2 10*3/uL — ABNORMAL HIGH (ref 4.0–10.5)

## 2012-07-03 LAB — CREATININE, SERUM
Creatinine, Ser: 6.89 mg/dL — ABNORMAL HIGH (ref 0.50–1.35)
GFR calc Af Amer: 10 mL/min — ABNORMAL LOW (ref >90)
GFR calc non Af Amer: 9 mL/min — ABNORMAL LOW (ref >90)

## 2012-07-03 LAB — CK: Total CK: 154 U/L (ref 7–232)

## 2012-07-03 LAB — CLOSTRIDIUM DIFFICILE BY PCR: Toxigenic C. Difficile by PCR: NEGATIVE

## 2012-07-03 LAB — POCT I-STAT TROPONIN I: Troponin i, poc: 0.05 ng/mL (ref 0.00–0.08)

## 2012-07-03 MED ORDER — SODIUM CHLORIDE 0.9 % IJ SOLN
3.0000 mL | Freq: Two times a day (BID) | INTRAMUSCULAR | Status: DC
  Administered 2012-07-04 – 2012-07-07 (×3): 3 mL via INTRAVENOUS
  Administered 2012-07-08: 10 mL via INTRAVENOUS
  Administered 2012-07-08 – 2012-07-14 (×13): 3 mL via INTRAVENOUS

## 2012-07-03 MED ORDER — OSELTAMIVIR PHOSPHATE 75 MG PO CAPS
75.0000 mg | ORAL_CAPSULE | Freq: Every day | ORAL | Status: DC
  Administered 2012-07-03 – 2012-07-06 (×4): 75 mg via ORAL
  Filled 2012-07-03 (×5): qty 1

## 2012-07-03 MED ORDER — INSULIN PUMP
SUBCUTANEOUS | Status: DC
  Administered 2012-07-03 (×2): via SUBCUTANEOUS
  Filled 2012-07-03: qty 1

## 2012-07-03 MED ORDER — VANCOMYCIN HCL 10 G IV SOLR: 1750.0000 mg | INTRAVENOUS | Status: DC

## 2012-07-03 MED ORDER — VANCOMYCIN HCL 10 G IV SOLR
2000.0000 mg | Freq: Once | INTRAVENOUS | Status: AC
  Administered 2012-07-03: 2000 mg via INTRAVENOUS
  Filled 2012-07-03: qty 2000

## 2012-07-03 MED ORDER — DEXTROSE 5 % IV SOLN
1.0000 g | INTRAVENOUS | Status: DC
  Administered 2012-07-03: 1 g via INTRAVENOUS
  Filled 2012-07-03: qty 1

## 2012-07-03 MED ORDER — CALCIUM ACETATE 667 MG PO CAPS
2001.0000 mg | ORAL_CAPSULE | Freq: Three times a day (TID) | ORAL | Status: DC
  Filled 2012-07-03: qty 3

## 2012-07-03 MED ORDER — CYCLOSPORINE MODIFIED (NEORAL) 100 MG PO CAPS
100.0000 mg | ORAL_CAPSULE | Freq: Two times a day (BID) | ORAL | Status: DC
  Administered 2012-07-03: 100 mg via ORAL
  Filled 2012-07-03: qty 1
  Filled 2012-07-03: qty 4
  Filled 2012-07-03: qty 1

## 2012-07-03 MED ORDER — CYCLOSPORINE 25 MG PO CAPS
75.0000 mg | ORAL_CAPSULE | Freq: Two times a day (BID) | ORAL | Status: DC
Start: 2012-07-03 — End: 2012-07-03

## 2012-07-03 MED ORDER — ALLOPURINOL 100 MG PO TABS
200.0000 mg | ORAL_TABLET | Freq: Every day | ORAL | Status: DC
  Filled 2012-07-03: qty 2

## 2012-07-03 MED ORDER — CALCIUM ACETATE 667 MG PO CAPS: 1334.0000 mg | ORAL_CAPSULE | Freq: Every day | ORAL | Status: DC

## 2012-07-03 MED ORDER — SODIUM CHLORIDE 0.9 % IV BOLUS (SEPSIS)
500.0000 mL | INTRAVENOUS | Status: AC
  Administered 2012-07-03: 500 mL via INTRAVENOUS

## 2012-07-03 MED ORDER — VANCOMYCIN 50 MG/ML ORAL SOLUTION
250.0000 mg | Freq: Four times a day (QID) | ORAL | Status: DC
  Administered 2012-07-03 – 2012-07-04 (×2): 250 mg via ORAL
  Filled 2012-07-03 (×6): qty 5

## 2012-07-03 MED ORDER — ACETAMINOPHEN 650 MG RE SUPP
650.0000 mg | Freq: Four times a day (QID) | RECTAL | Status: DC | PRN
  Administered 2012-07-04: 650 mg via RECTAL
  Filled 2012-07-03 (×2): qty 1

## 2012-07-03 MED ORDER — VANCOMYCIN 50 MG/ML ORAL SOLUTION
125.0000 mg | Freq: Four times a day (QID) | ORAL | Status: DC
  Filled 2012-07-03 (×3): qty 2.5

## 2012-07-03 MED ORDER — METOCLOPRAMIDE HCL 5 MG PO TABS
5.0000 mg | ORAL_TABLET | Freq: Three times a day (TID) | ORAL | Status: DC
  Administered 2012-07-03 – 2012-07-05 (×4): 5 mg via ORAL
  Filled 2012-07-03 (×10): qty 1

## 2012-07-03 MED ORDER — DIPHENHYDRAMINE HCL 25 MG PO CAPS: 50.0000 mg | ORAL_CAPSULE | Freq: Every evening | ORAL | Status: DC | PRN

## 2012-07-03 MED ORDER — CARVEDILOL 12.5 MG PO TABS
12.5000 mg | ORAL_TABLET | Freq: Two times a day (BID) | ORAL | Status: DC
  Administered 2012-07-04: 12.5 mg via ORAL
  Filled 2012-07-03 (×3): qty 1

## 2012-07-03 MED ORDER — CALCIUM ACETATE 667 MG PO CAPS
1334.0000 mg | ORAL_CAPSULE | Freq: Two times a day (BID) | ORAL | Status: DC | PRN
  Filled 2012-07-03: qty 2

## 2012-07-03 MED ORDER — SODIUM CHLORIDE 0.9 % IJ SOLN
3.0000 mL | Freq: Two times a day (BID) | INTRAMUSCULAR | Status: DC
Start: 2012-07-03 — End: 2012-07-07
  Administered 2012-07-04 – 2012-07-07 (×3): 3 mL via INTRAVENOUS

## 2012-07-03 MED ORDER — DARBEPOETIN ALFA-POLYSORBATE 40 MCG/0.4ML IJ SOLN: 40.0000 ug | INTRAMUSCULAR | Status: DC

## 2012-07-03 MED ORDER — ACETAMINOPHEN 325 MG PO TABS
650.0000 mg | ORAL_TABLET | Freq: Four times a day (QID) | ORAL | Status: DC | PRN
  Administered 2012-07-04: 650 mg via ORAL
  Filled 2012-07-03: qty 2

## 2012-07-03 MED ORDER — BIOTENE DRY MOUTH MT LIQD: 15.0000 mL | Freq: Two times a day (BID) | OROMUCOSAL | Status: DC

## 2012-07-03 MED ORDER — SODIUM CHLORIDE 0.9 % IV SOLN
250.0000 mL | INTRAVENOUS | Status: DC | PRN
  Administered 2012-07-06: 250 mL via INTRAVENOUS

## 2012-07-03 MED ORDER — SODIUM CHLORIDE 0.9 % IV BOLUS (SEPSIS)
250.0000 mL | Freq: Once | INTRAVENOUS | Status: AC
  Administered 2012-07-03: 250 mL via INTRAVENOUS

## 2012-07-03 MED ORDER — PREDNISONE 5 MG PO TABS
5.0000 mg | ORAL_TABLET | Freq: Every day | ORAL | Status: DC
  Administered 2012-07-04 – 2012-07-05 (×2): 5 mg via ORAL
  Filled 2012-07-03 (×3): qty 1

## 2012-07-03 MED ORDER — CYCLOSPORINE MODIFIED (NEORAL) 25 MG PO CAPS
75.0000 mg | ORAL_CAPSULE | Freq: Two times a day (BID) | ORAL | Status: DC
  Administered 2012-07-03: 75 mg via ORAL
  Filled 2012-07-03 (×3): qty 3

## 2012-07-03 MED ORDER — ASPIRIN 81 MG PO CHEW
81.0000 mg | CHEWABLE_TABLET | Freq: Every day | ORAL | Status: DC
  Administered 2012-07-04 – 2012-07-14 (×10): 81 mg via ORAL
  Filled 2012-07-03 (×11): qty 1

## 2012-07-03 MED ORDER — FENTANYL CITRATE 0.05 MG/ML IJ SOLN
50.0000 ug | Freq: Once | INTRAMUSCULAR | Status: AC
  Administered 2012-07-03: 50 ug via INTRAVENOUS
  Filled 2012-07-03: qty 2

## 2012-07-03 MED ORDER — CYCLOSPORINE MODIFIED (NEORAL) 100 MG PO CAPS: 100.0000 mg | ORAL_CAPSULE | Freq: Two times a day (BID) | ORAL | Status: DC

## 2012-07-03 MED ORDER — LEVOTHYROXINE SODIUM 88 MCG PO TABS
88.0000 ug | ORAL_TABLET | Freq: Every day | ORAL | Status: DC
  Administered 2012-07-04 – 2012-07-14 (×11): 88 ug via ORAL
  Filled 2012-07-03 (×13): qty 1

## 2012-07-03 MED ORDER — PROMETHAZINE HCL 25 MG PO TABS
25.0000 mg | ORAL_TABLET | Freq: Four times a day (QID) | ORAL | Status: DC | PRN
  Administered 2012-07-03 – 2012-07-11 (×8): 25 mg via ORAL
  Filled 2012-07-03 (×9): qty 1

## 2012-07-03 MED ORDER — SODIUM CHLORIDE 0.9 % IV SOLN
INTRAVENOUS | Status: DC
  Administered 2012-07-03 – 2012-07-04 (×2): via INTRAVENOUS

## 2012-07-03 MED ORDER — HYDROMORPHONE HCL 2 MG PO TABS
2.0000 mg | ORAL_TABLET | Freq: Four times a day (QID) | ORAL | Status: DC | PRN
  Administered 2012-07-03 – 2012-07-06 (×5): 2 mg via ORAL
  Filled 2012-07-03 (×5): qty 1

## 2012-07-03 MED ORDER — ONDANSETRON HCL 4 MG/2ML IJ SOLN
INTRAMUSCULAR | Status: AC
  Filled 2012-07-03: qty 2

## 2012-07-03 MED ORDER — NITROGLYCERIN 0.4 MG SL SUBL: 0.4000 mg | SUBLINGUAL_TABLET | SUBLINGUAL | Status: DC | PRN

## 2012-07-03 MED ORDER — SODIUM CHLORIDE 0.9 % IJ SOLN: 3.0000 mL | INTRAMUSCULAR | Status: DC | PRN

## 2012-07-03 MED ORDER — MORPHINE SULFATE 2 MG/ML IJ SOLN: 2.0000 mg | INTRAMUSCULAR | Status: DC | PRN

## 2012-07-03 MED ORDER — ACETAMINOPHEN 325 MG PO TABS
650.0000 mg | ORAL_TABLET | Freq: Once | ORAL | Status: AC
  Administered 2012-07-03: 650 mg via ORAL
  Filled 2012-07-03: qty 2

## 2012-07-03 MED ORDER — METRONIDAZOLE 500 MG PO TABS
500.0000 mg | ORAL_TABLET | Freq: Three times a day (TID) | ORAL | Status: DC
  Administered 2012-07-03 – 2012-07-04 (×2): 500 mg via ORAL
  Filled 2012-07-03 (×5): qty 1

## 2012-07-03 MED ORDER — HEPARIN SODIUM (PORCINE) 5000 UNIT/ML IJ SOLN
5000.0000 [IU] | Freq: Three times a day (TID) | INTRAMUSCULAR | Status: DC
  Administered 2012-07-03 – 2012-07-07 (×10): 5000 [IU] via SUBCUTANEOUS
  Filled 2012-07-03 (×14): qty 1

## 2012-07-03 MED ORDER — ASPIRIN 81 MG PO TABS: 81.0000 mg | ORAL_TABLET | Freq: Every evening | ORAL | Status: DC

## 2012-07-03 MED ORDER — ONDANSETRON HCL 4 MG/2ML IJ SOLN
4.0000 mg | Freq: Once | INTRAMUSCULAR | Status: AC
  Administered 2012-07-03: 4 mg via INTRAVENOUS

## 2012-07-03 NOTE — Progress Notes (Signed)
Patient refuses CPAP.  Patient states that he cannot wear a CPAP because he is claustrophobic and the he fights the mask when wearing it.

## 2012-07-03 NOTE — ED Notes (Signed)
CG4 & Istat Troponin drawn by [facility]

## 2012-07-03 NOTE — ED Provider Notes (Signed)
History     CSN: [id number]  Arrival date & time 07/03/12  1300   First MD Initiated Contact with Patient 07/03/12 1326      Chief Complaint  Patient presents with  . Fever  . Nausea  . Generalized Body Aches    (Consider location/radiation/quality/duration/timing/severity/associated sxs/prior treatment) Patient is a 47 y.o. male presenting with general illness. The history is provided by the patient. No language interpreter was used.  Illness  The current episode started yesterday. The onset was sudden. The problem occurs continuously. The problem has been unchanged. The problem is moderate. Nothing relieves the symptoms. Nothing aggravates the symptoms. Associated symptoms include a fever, diarrhea, nausea, vomiting and muscle aches. Pertinent negatives include no abdominal pain, no congestion, no headaches, no rhinorrhea, no sore throat, no stridor and no neck pain. He has been less active. He has been drinking less than usual and eating less than usual. The last void occurred less than 6 hours ago. There were no sick contacts. Recently, medical care has been given by a specialist (saw nephrologist yesterday).    Past Medical History  Diagnosis Date  . Insulin dependent diabetes mellitus   . Hypertension   . History of blood transfusion   . ESRD (end stage renal disease)     a. 1995 s/p cadaveric transplant w/ susbequent failure after 18 yrs;  b.Dialysis initiated 07/2011  . Dyspnea     a. 07/23/11 Echo: EF 55-60%  . Anemia of chronic disease   . Cholecystitis     a. 08/27/2011  . Constipation   . Pericardial effusion     a.  Small by CT 08/22/11;  b.  Large by CT 08/27/11  . Inguinal lymphadenopathy     a. bilateral - s/p biopsy 07/2011    Past Surgical History  Procedure Laterality Date  . Appendectomy    . Hernia repair    . Kidney transplant    . Cataract extraction    . Arteriovenous graft placement    . Av fistula placement    . Toe amputation    . Below knee leg  amputation    . Carpal tunnel release    . Shoulder surgery    . Av fistula placement  07/29/2011    Procedure: ARTERIOVENOUS (AV) FISTULA CREATION;  Surgeon: Mal Misty, MD;  Location: [place];  Service: Vascular;  Laterality: Right;  Brachial cephalic  . Insertion of dialysis catheter  08/01/2011    Procedure: INSERTION OF DIALYSIS CATHETER;  Surgeon: Rosetta Posner, MD;  Location: [place];  Service: Vascular;  Laterality: Right;  insertion of dialysis catheter on right internal jugular vein  . Lymph node biopsy  08/10/2011    Procedure: LYMPH NODE BIOPSY;  Surgeon: Merrie Roof, MD;  Location: [place];  Service: General;  Laterality: Left;  left groin lymph biopsy  . Colonoscopy  08/29/2011    Procedure: COLONOSCOPY;  Surgeon: Jerene Bears, MD;  Location: [place];  Service: Gastroenterology;  Laterality: N/A;    Family History  Problem Relation Age of Onset  . Malignant hyperthermia Neg Hx   . Colon polyps Father   . Diabetes Father   . Cancer Father   . Breast cancer Maternal Aunt   . Cancer Maternal Aunt     Breast and Bone    History  Substance Use Topics  . Smoking status: Never Smoker   . Smokeless tobacco: Never Used  . Alcohol Use: No  Comment: occasionally 1xmonth      Review of Systems  Constitutional: Positive for fever. Negative for chills, diaphoresis, activity change, appetite change and fatigue.  HENT: Negative for congestion, sore throat, facial swelling, rhinorrhea, drooling, neck pain and voice change.   Respiratory: Negative for shortness of breath and stridor.   Gastrointestinal: Positive for nausea, vomiting and diarrhea. Negative for abdominal pain and abdominal distention.  Endocrine: Negative for polydipsia and polyuria.  Genitourinary: Negative for dysuria, urgency, frequency and decreased urine volume.  Musculoskeletal: Negative for back pain and gait problem.  Skin: Positive for pallor. Negative for color change and wound.  Neurological:  Negative for facial asymmetry, weakness, numbness and headaches.  Hematological: Does not bruise/bleed easily.  Psychiatric/Behavioral: Negative for confusion and agitation.    Allergies  Morphine and related; Amoxicillin; Ciprofloxacin; Clindamycin/lincomycin; Codeine; Levofloxacin; Vasotec; and Oxycodone  Home Medications   Current Outpatient Rx  Name  Route  Sig  Dispense  Refill  . allopurinol (ZYLOPRIM) 100 MG tablet   Oral   Take 200 mg by mouth daily. 2 Tabs         . amLODipine (NORVASC) 5 MG tablet   Oral   Take 1 tablet (5 mg total) by mouth daily.   30 tablet   0   . aspirin 81 MG tablet   Oral   Take 81 mg by mouth every evening.          . calcium acetate (PHOSLO) 667 MG capsule   Oral   Take 1,334-2,001 mg by mouth 5 (five) times daily. 2001 mg with meals and 1334 mg with snacks         . carvedilol (COREG) 6.25 MG tablet   Oral   Take 12.5 mg by mouth 2 (two) times daily with a meal.         . cycloSPORINE (SANDIMMUNE) 25 MG capsule   Oral   Take 75 mg by mouth 2 (two) times daily.         . cycloSPORINE modified (NEORAL/GENGRAF) 100 MG capsule   Oral   Take 100 mg by mouth 2 (two) times daily. Neoral brand         . diphenhydrAMINE (BENADRYL) 25 mg capsule   Oral   Take 50 mg by mouth at bedtime as needed for itching or sleep.         [name] Kitchen docusate sodium (COLACE) 100 MG capsule   Oral   Take 200 mg by mouth 3 (three) times daily.         . furosemide (LASIX) 80 MG tablet      160 mg 3 (three) times daily.          [name] Kitchen GLUCAGON EMERGENCY 1 MG injection   Intramuscular   Inject 1 mg into the muscle once as needed.          [name] Kitchen HYDROmorphone (DILAUDID) 2 MG tablet   Oral   Take 2 mg by mouth 4 (four) times daily as needed. For pain         . Insulin Human (INSULIN PUMP) 100 unit/ml SOLN   Subcutaneous   Inject into the skin continuous. Humalog         . levothyroxine (SYNTHROID, LEVOTHROID) 88 MCG tablet   Oral   Take  88 mcg by mouth daily.         [name] Kitchen loratadine (CLARITIN) 10 MG tablet   Oral   Take 10 mg by mouth daily.         [name] Kitchen  metoCLOPramide (REGLAN) 5 MG tablet   Oral   Take 5 mg by mouth 4 (four) times daily - after meals and at bedtime.         . nitroGLYCERIN (NITROSTAT) 0.4 MG SL tablet   Sublingual   Place 0.4 mg under the tongue every 5 (five) minutes as needed for chest pain.          . predniSONE (DELTASONE) 5 MG tablet   Oral   Take 5 mg by mouth Daily.         . promethazine (PHENERGAN) 25 MG tablet   Oral   Take 25 mg by mouth Every 6 hours as needed. For nausea         . PROVENTIL HFA 108 (90 BASE) MCG/ACT inhaler   Inhalation   Inhale 1 puff into the lungs Every 6 hours as needed. For shortness of breath         . ranitidine (ZANTAC) 150 MG tablet   Oral   Take 1 tablet (150 mg total) by mouth 2 (two) times daily.   60 tablet   3   . rOPINIRole (REQUIP) 1 MG tablet   Oral   Take 1 mg by mouth Every evening. Take at 2000         . epoetin alfa (EPOGEN,PROCRIT) 29562 UNIT/ML injection   Subcutaneous   Inject 40,000 Units into the skin every 21 ( twenty-one) days. If hemoglobin is below 12           BP 123/56  Pulse 103  Temp(Src) 102.9 F (39.4 C) (Rectal)  Resp 20  Ht 5\' 8"  (1.727 m)  Wt 274 lb (124.286 kg)  BMI 41.67 kg/m2  SpO2 99%  Physical Exam  Constitutional: He is oriented to person, place, and time. He appears well-developed and well-nourished. No distress.  HENT:  Head: Normocephalic and atraumatic.  Mouth/Throat: No oropharyngeal exudate.  Eyes: Pupils are equal, round, and reactive to light.  Neck: Normal range of motion. Neck supple.  Cardiovascular: Regular rhythm and normal heart sounds.  Tachycardia present.  Exam reveals no gallop and no friction rub.   No murmur heard. Pulmonary/Chest: Effort normal and breath sounds normal. No respiratory distress. He has no wheezes. He has no rales.  Abdominal: Soft. Bowel sounds are  normal. He exhibits no distension and no mass. There is tenderness (generalized). There is no rebound and no guarding.  Musculoskeletal: Normal range of motion. He exhibits no edema and no tenderness.  Neurological: He is alert and oriented to person, place, and time.  Skin: Skin is warm. He is diaphoretic.  Psychiatric: He has a normal mood and affect.    ED Course  Procedures (including critical care time)  Labs Reviewed  CBC WITH DIFFERENTIAL - Abnormal; Notable for the following:    WBC 18.4 (*)    RBC 3.57 (*)    Hemoglobin 11.4 (*)    HCT 34.7 (*)    RDW 15.7 (*)    Platelets 149 (*)    Neutrophils Relative 92 (*)    Neutro Abs 17.0 (*)    Lymphocytes Relative 5 (*)    All other components within normal limits  COMPREHENSIVE METABOLIC PANEL - Abnormal; Notable for the following:    Sodium 132 (*)    CO2 18 (*)    Glucose, Bld 188 (*)    BUN 110 (*)    Creatinine, Ser 6.60 (*)    Albumin 2.3 (*)    GFR calc non Af  Amer 9 (*)    GFR calc Af Amer 10 (*)    All other components within normal limits  URINALYSIS, ROUTINE W REFLEX MICROSCOPIC - Abnormal; Notable for the following:    APPearance TURBID (*)    Glucose, UA 250 (*)    Hgb urine dipstick MODERATE (*)    Protein, ur >300 (*)    Leukocytes, UA MODERATE (*)    All other components within normal limits  URINE MICROSCOPIC-ADD ON - Abnormal; Notable for the following:    Squamous Epithelial / LPF MANY (*)    Bacteria, UA FEW (*)    Casts GRANULAR CAST (*)    All other components within normal limits  CG4 I-STAT (LACTIC ACID) - Abnormal; Notable for the following:    Lactic Acid, Venous 2.73 (*)    All other components within normal limits  CULTURE, BLOOD (ROUTINE X 2)  CULTURE, BLOOD (ROUTINE X 2)  URINE CULTURE  CLOSTRIDIUM DIFFICILE BY PCR  CYCLOSPORINE LEVEL  INFLUENZA PANEL BY PCR  POCT I-STAT TROPONIN I   Dg Chest 2 View  07/03/2012  *RADIOLOGY REPORT*  Clinical Data: Fever and nausea  CHEST - 2 VIEW   Comparison: June 17, 2012  Findings: The lungs are clear.  The heart size and pulmonary vascularity are within normal limits.  No adenopathy.  There is degenerative change in the thoracic spine.  IMPRESSION: No edema or consolidation.   Original Report Authenticated By: Lowella Grip, M.D.      1. Fever and chills   2. Diarrhea   3. Nausea   4. Leukocytosis   5. AKI (acute kidney injury)      Date: 07/03/2012  Rate: 106  Rhythm: sinus tachycardia  QRS Axis: normal  Intervals: normal  ST/T Wave abnormalities: normal  Conduction Disutrbances:none  Narrative Interpretation:   Old EKG Reviewed: changes noted , prior EKG w/ nml rate    MDM  Pt is a 47 y.o. male with pertinent PMHX of DM-I, HTN, ESRD s/p kidney transplant 18 yrs ago with recent worsening kidney function who presents with less than 24 hrs of sudden onset myalgias, nausea, dry heaves, >20 episodes of diarrhea, and fever.  T 102.7 this morning.  No sick contacts, had nephrology appt yesterday. SBP92 upon arrival, pt pale, ill appearing, diaphoretic, slightly tachycardic.  Pt found to have worsening of his CRI to 6.6, BUN 110, elevated WBC count, elevated LA.  Two 500cc NS boluses given with improvement of BP. Vanc cefepime ordered for broad spectrum abx coverage.   CXR w/o acute findings. [facility] consulted for admission, recommended addition of PO vanc for concern for C diff.  Renal also consulted and will follow as inpt.     1. Fever and chills   2. Diarrhea   3. Nausea   4. Leukocytosis   5. AKI (acute kidney injury)      Labs and imaging considered in decision making, reviewed by myself.  Imaging interpreted by radiology. Pt care discussed with my attending, Dr. Sabra Heck.          Ernestina Patches, MD 07/03/12 [phone]

## 2012-07-03 NOTE — Consult Note (Signed)
[facility] - CONSULT NOTE Resident Note    Please see below for attending addendum to resident note.   Date: 07/03/2012                  Patient Name:  Dale [name]  MRN: [id number]  DOB: 1965/12/15  Age / Sex: 47 y.o., male         PCP: MATTINGLY,MICHAEL T                 Referring Physician: ED Physician                 Reason for Consult: A/C RF            History of Present Illness: Patient is a 47 y.o. male with a PMHx of s/p cadaveric kidney transplant with subsequent failure after 18 years, ESRD with last HD 10/2011,  HTN, DM on insulin pump, AOC, s/p BKA who was admitted to [facility] on 07/03/2012 for evaluation of acute onset fever 102.7, myalgias, nausea and more than 20 bowel movements of watery non bloody diarrhea x 1 day. Wife had flulike symptoms 1 week ago.  Of note, he was evelauated by Dr. Mercy Moore at office at [facility] 07/02/12 and had labs as follows: Cr 5.4, HGB 11.0, Albumin 3.1, PTH 48, PO4 5.4, Cyclosporin level 183.  He was recently discharged from the hospital for chest pain on 06/19/2012.    Medications: Outpatient medications:   Medication List    ASK your doctor about these medications       allopurinol 100 MG tablet  Commonly known as:  ZYLOPRIM  Take 200 mg by mouth daily. 2 Tabs     amLODipine 5 MG tablet  Commonly known as:  NORVASC  Take 1 tablet (5 mg total) by mouth daily.     aspirin 81 MG tablet  Take 81 mg by mouth every evening.     calcium acetate 667 MG capsule  Commonly known as:  PHOSLO  Take 1,334-2,001 mg by mouth 5 (five) times daily. 2001 mg with meals and 1334 mg with snacks     carvedilol 6.25 MG tablet  Commonly known as:  COREG  Take 12.5 mg by mouth 2 (two) times daily with a meal.     cycloSPORINE 25 MG capsule  Commonly known as:  SANDIMMUNE  Take 75 mg by mouth 2 (two) times daily.     cycloSPORINE modified 100 MG capsule  Commonly known as:  NEORAL  Take 100 mg by mouth 2 (two) times daily. Neoral  brand     diphenhydrAMINE 25 mg capsule  Commonly known as:  BENADRYL  Take 50 mg by mouth at bedtime as needed for itching or sleep.     docusate sodium 100 MG capsule  Commonly known as:  COLACE  Take 200 mg by mouth 3 (three) times daily.     epoetin alfa 40000 UNIT/ML injection  Commonly known as:  EPOGEN,PROCRIT  Inject 40,000 Units into the skin every 21 ( twenty-one) days. If hemoglobin is below 12     furosemide 80 MG tablet  Commonly known as:  LASIX  160 mg 3 (three) times daily.     GLUCAGON EMERGENCY 1 MG injection  Generic drug:  glucagon  Inject 1 mg into the muscle once as needed.     HYDROmorphone 2 MG tablet  Commonly known as:  DILAUDID  Take 2 mg by mouth 4 (four) times daily as needed. For pain  insulin pump 100 unit/ml Soln  Inject into the skin continuous. Humalog     levothyroxine 88 MCG tablet  Commonly known as:  SYNTHROID, LEVOTHROID  Take 88 mcg by mouth daily.     loratadine 10 MG tablet  Commonly known as:  CLARITIN  Take 10 mg by mouth daily.     metoCLOPramide 5 MG tablet  Commonly known as:  REGLAN  Take 5 mg by mouth 4 (four) times daily - after meals and at bedtime.     nitroGLYCERIN 0.4 MG SL tablet  Commonly known as:  NITROSTAT  Place 0.4 mg under the tongue every 5 (five) minutes as needed for chest pain.     predniSONE 5 MG tablet  Commonly known as:  DELTASONE  Take 5 mg by mouth Daily.     promethazine 25 MG tablet  Commonly known as:  PHENERGAN  Take 25 mg by mouth Every 6 hours as needed. For nausea     PROVENTIL HFA 108 (90 BASE) MCG/ACT inhaler  Generic drug:  albuterol  Inhale 1 puff into the lungs Every 6 hours as needed. For shortness of breath     ranitidine 150 MG tablet  Commonly known as:  ZANTAC  Take 1 tablet (150 mg total) by mouth 2 (two) times daily.     rOPINIRole 1 MG tablet  Commonly known as:  REQUIP  Take 1 mg by mouth Every evening. Take at 2000        Current medications: Current  Facility-Administered Medications  Medication Dose Route Frequency Provider Last Rate Last Dose  . ceFEPIme (MAXIPIME) 1 g in dextrose 5 % 50 mL IVPB  1 g Intravenous Q24H Johnna Acosta, MD 100 mL/hr at 07/03/12 1603 1 g at 07/03/12 1603  . oseltamivir (TAMIFLU) capsule 75 mg  75 mg Oral BID Charlynne Cousins, MD      . Derrill Memo ON 07/05/2012] vancomycin (VANCOCIN) 1,750 mg in sodium chloride 0.9 % 500 mL IVPB  1,750 mg Intravenous Q48H Manley Mason, RPH      . vancomycin (VANCOCIN) 2,000 mg in sodium chloride 0.9 % 500 mL IVPB  2,000 mg Intravenous Once Johnna Acosta, MD      . vancomycin (VANCOCIN) 50 mg/mL oral solution 125 mg  125 mg Oral Q6H Ernestina Patches, MD       Current Outpatient Prescriptions  Medication Sig Dispense Refill  . allopurinol (ZYLOPRIM) 100 MG tablet Take 200 mg by mouth daily. 2 Tabs      . amLODipine (NORVASC) 5 MG tablet Take 1 tablet (5 mg total) by mouth daily.  30 tablet  0  . aspirin 81 MG tablet Take 81 mg by mouth every evening.       . calcium acetate (PHOSLO) 667 MG capsule Take 1,334-2,001 mg by mouth 5 (five) times daily. 2001 mg with meals and 1334 mg with snacks      . carvedilol (COREG) 6.25 MG tablet Take 12.5 mg by mouth 2 (two) times daily with a meal.      . cycloSPORINE (SANDIMMUNE) 25 MG capsule Take 75 mg by mouth 2 (two) times daily.      . cycloSPORINE modified (NEORAL/GENGRAF) 100 MG capsule Take 100 mg by mouth 2 (two) times daily. Neoral brand      . diphenhydrAMINE (BENADRYL) 25 mg capsule Take 50 mg by mouth at bedtime as needed for itching or sleep.      [name] Kitchen docusate sodium (COLACE) 100 MG capsule Take 200  mg by mouth 3 (three) times daily.      . furosemide (LASIX) 80 MG tablet 160 mg 3 (three) times daily.       [name] Kitchen GLUCAGON EMERGENCY 1 MG injection Inject 1 mg into the muscle once as needed.       [name] Kitchen HYDROmorphone (DILAUDID) 2 MG tablet Take 2 mg by mouth 4 (four) times daily as needed. For pain      . Insulin Human (INSULIN PUMP) 100  unit/ml SOLN Inject into the skin continuous. Humalog      . levothyroxine (SYNTHROID, LEVOTHROID) 88 MCG tablet Take 88 mcg by mouth daily.      [name] Kitchen loratadine (CLARITIN) 10 MG tablet Take 10 mg by mouth daily.      . metoCLOPramide (REGLAN) 5 MG tablet Take 5 mg by mouth 4 (four) times daily - after meals and at bedtime.      . nitroGLYCERIN (NITROSTAT) 0.4 MG SL tablet Place 0.4 mg under the tongue every 5 (five) minutes as needed for chest pain.       . predniSONE (DELTASONE) 5 MG tablet Take 5 mg by mouth Daily.      . promethazine (PHENERGAN) 25 MG tablet Take 25 mg by mouth Every 6 hours as needed. For nausea      . PROVENTIL HFA 108 (90 BASE) MCG/ACT inhaler Inhale 1 puff into the lungs Every 6 hours as needed. For shortness of breath      . ranitidine (ZANTAC) 150 MG tablet Take 1 tablet (150 mg total) by mouth 2 (two) times daily.  60 tablet  3  . rOPINIRole (REQUIP) 1 MG tablet Take 1 mg by mouth Every evening. Take at 2000      . epoetin alfa (EPOGEN,PROCRIT) 91478 UNIT/ML injection Inject 40,000 Units into the skin every 21 ( twenty-one) days. If hemoglobin is below 12          Allergies: Allergies  Allergen Reactions  . Morphine And Related Rash    nausea  . Amoxicillin Other (See Comments)    Reaction unknown  . Ciprofloxacin Other (See Comments)    Reaction unknown  . Clindamycin/Lincomycin Other (See Comments)    Reaction unknown  . Codeine Other (See Comments)    Reaction unknown. But pt has tolerated hydrocodone in Vicodin and Norco in the past  . Levofloxacin Other (See Comments)    Reaction unknown  . Vasotec Cough  . Oxycodone Rash      Past Medical History: Past Medical History  Diagnosis Date  . Insulin dependent diabetes mellitus   . Hypertension   . History of blood transfusion   . ESRD (end stage renal disease)     a. 1995 s/p cadaveric transplant w/ susbequent failure after 18 yrs;  b.Dialysis initiated 07/2011  . Dyspnea     a. 07/23/11 Echo: EF  55-60%  . Anemia of chronic disease   . Cholecystitis     a. 08/27/2011  . Constipation   . Pericardial effusion     a.  Small by CT 08/22/11;  b.  Large by CT 08/27/11  . Inguinal lymphadenopathy     a. bilateral - s/p biopsy 07/2011     Past Surgical History: Past Surgical History  Procedure Laterality Date  . Appendectomy    . Hernia repair    . Kidney transplant    . Cataract extraction    . Arteriovenous graft placement    . Av fistula placement    . Toe amputation    .  Below knee leg amputation    . Carpal tunnel release    . Shoulder surgery    . Av fistula placement  07/29/2011    Procedure: ARTERIOVENOUS (AV) FISTULA CREATION;  Surgeon: Mal Misty, MD;  Location: [place];  Service: Vascular;  Laterality: Right;  Brachial cephalic  . Insertion of dialysis catheter  08/01/2011    Procedure: INSERTION OF DIALYSIS CATHETER;  Surgeon: Rosetta Posner, MD;  Location: [place];  Service: Vascular;  Laterality: Right;  insertion of dialysis catheter on right internal jugular vein  . Lymph node biopsy  08/10/2011    Procedure: LYMPH NODE BIOPSY;  Surgeon: Merrie Roof, MD;  Location: [place];  Service: General;  Laterality: Left;  left groin lymph biopsy  . Colonoscopy  08/29/2011    Procedure: COLONOSCOPY;  Surgeon: Jerene Bears, MD;  Location: [place];  Service: Gastroenterology;  Laterality: N/A;     Family History: Family History  Problem Relation Age of Onset  . Malignant hyperthermia Neg Hx   . Colon polyps Father   . Diabetes Father   . Cancer Father   . Breast cancer Maternal Aunt   . Cancer Maternal Aunt     Breast and Bone     Social History:  reports that he has never smoked. He has never used smokeless tobacco. He reports that he does not drink alcohol or use illicit drugs.   Review of Systems: As per HPI  Vital Signs: Blood pressure 114/78, pulse 98, temperature 102.9 F (39.4 C), temperature source Rectal, resp. rate 20, height 5\' 8"  (1.727 m), weight 274  lb (124.286 kg), SpO2 98.00%.  Weight trends: Filed Weights   07/03/12 1500  Weight: 274 lb (124.286 kg)    Physical Exam: General: Vital signs reviewed and noted. Acute ill appearing/ obesity  Head: Normocephalic, atraumatic.  Eyes: PERRL, EOMI, No signs of anemia or jaundince.  Nose: Mucous membranes dry, not inflammed, nonerythematous.  Throat: Oropharynx nonerythematous, no exudate appreciated.   Neck: No deformities, masses, or tenderness noted.Supple, No carotid Bruits, no JVD.  Lungs:  Normal respiratory effort. Clear to auscultation BL without crackles or wheezes.  Heart: RRR. S1 and S2 normal without gallop, murmur, or rubs.  Abdomen:  BS normoactive. Soft, Nondistended, non-tender.  No masses or organomegaly.  Extremities: No pretibial edema. RUA AVF + thrill and bruit.  Neurologic: A&O X3, CN II - XII are grossly intact. Motor strength is 5/5 in the all 4 extremities, Sensations intact to light touch, Cerebellar signs negative.  Skin: No visible rashes, scars.    Lab results: Basic Metabolic Panel:  Recent Labs Lab 07/03/12 1323  Liliane Mallis 132*  K 3.8  CL 96  CO2 18*  GLUCOSE 188*  BUN 110*  CREATININE 6.60*  CALCIUM 8.9    Liver Function Tests:  Recent Labs Lab 07/03/12 1323  AST 19  ALT 10  ALKPHOS 67  BILITOT 0.4  PROT 7.3  ALBUMIN 2.3*   No results found for this basename: LIPASE, AMYLASE,  in the last 168 hours No results found for this basename: AMMONIA,  in the last 168 hours  CBC:  Recent Labs Lab 07/03/12 1323  WBC 18.4*  NEUTROABS 17.0*  HGB 11.4*  HCT 34.7*  MCV 97.2  PLT 149*    Cardiac Enzymes: No results found for this basename: CKTOTAL, CKMB, CKMBINDEX, TROPONINI,  in the last 168 hours  BNP: No components found with this basename: POCBNP,   CBG: No results found  for this basename: GLUCAP,  in the last 168 hours  Microbiology: Results for orders placed during the hospital encounter of 06/17/12  MRSA PCR SCREENING      Status: None   Collection Time    06/18/12  3:18 AM      Result Value Range Status   MRSA by PCR NEGATIVE  NEGATIVE Final   Comment:            The GeneXpert MRSA Assay (FDA     approved for NASAL specimens     only), is one component of a     comprehensive MRSA colonization     surveillance program. It is not     intended to diagnose MRSA     infection nor to guide or     monitor treatment for     MRSA infections.    Coagulation Studies: No results found for this basename: LABPROT, INR,  in the last 72 hours  Urinalysis:  Recent Labs  07/03/12 1451  COLORURINE YELLOW  LABSPEC 1.022  PHURINE 5.0  GLUCOSEU 250*  HGBUR MODERATE*  BILIRUBINUR NEGATIVE  KETONESUR NEGATIVE  PROTEINUR >300*  UROBILINOGEN 0.2  NITRITE NEGATIVE  LEUKOCYTESUR MODERATE*      Imaging: Dg Chest 2 View  07/03/2012  *RADIOLOGY REPORT*  Clinical Data: Fever and nausea  CHEST - 2 VIEW  Comparison: June 17, 2012  Findings: The lungs are clear.  The heart size and pulmonary vascularity are within normal limits.  No adenopathy.  There is degenerative change in the thoracic spine.  IMPRESSION: No edema or consolidation.   Original Report Authenticated By: Lowella Grip, M.D.       Assessment & Plan:  Patient is a 47 y.o. male with a PMHx of s/p cadaveric kidney transplant with subsequent failure after 18 years, ESRD with HD on 07/2011,  HTN, DM on insulin pump, AOC, s/p BKA who was admitted to [facility] on 07/03/2012 for evaluation of myalgias and more than 20 bowel movements of watery non bloody diarrhea x 1 day.  Renal is consulted for A/C RF.   1. A/C RF, baseline Cr 5.39, BUN 98 and GFR 12 Patient presented with acute onset A/C RF with BUN 110, Cr 6.60 and GFR 9-10, which is not far from his baseline. The etiologies include volume depletion 2/2 decreased oral intake and diarrhea in the setting of diuretic Lasix and immunosuppressant medication.   - UA showed granular cast - Flu PCR pending -  hold phoslo in acute N/V/D - will start IVF at 100/h tonight ( 06/18/12 LVEF 55-60% with LVH) - insert Foley and strict I+O - caution renal toxic meds ( he is on [place] and cefepime)  - renal function test in am - No indication for HD-- No hyperkalemia, severe acidosis, fluid overload or s/s of uremia.    2. Diarrhea--AGE vs C-Diff - C-Diff pending   3. Non Gap metabolic acidosis - 2/2 GI loss of HCO3 - IVF  4. Hyponatremia--associated with RF  5. SIRS and lactic acidosis--per primary care team - ? infectious  - Blood culture pending - broad spectrum ABX started - C-dif pending  6. Anemia of chronic disease--HH stable on outpatient EPO  7. Pyuria - Urine culture pending - already started on ABX for possible sepsis  8. UA moderate HGB with only RBC 3-6 - recs checking CK--defer to primary team ( I called admitting MD)  DVT PPX - recs Heparin--defer to primary team  Patient history and plan of care reviewed  with attending, Dr. Posey Pronto.   Charlann Lange, MD  PGYII, Internal Medicine Resident 07/03/2012, 4:07 PM

## 2012-07-03 NOTE — ED Provider Notes (Signed)
I saw and evaluated the patient, reviewed the resident's note and I agree with the findings and plan.  Pt with multiple medical problems who is on multiple medicines and who has had a renal transplant in the past with gradually worsening renal function over the last few months.  He presents with n/v/d and significant generalized weakness.  Presents with hypotension and fever with some SOB.  Sx are severe, pt on exam has decreased LOC, he is somnolent but easily arousable, has hypotension and responsided well to fluids.  Was given broad spectrum abx and admitted to hospital to the hospitalist service.  Consults requested from nephrology service.  Pt likely septic with fever, leukocytosis, tachycardia and hypotension.  Fluid rescucitation and abx started, critical care provided.  CRITICAL CARE Performed by: Johnna Acosta   Total critical care time: 30  Critical care time was exclusive of separately billable procedures and treating other patients.  Critical care was necessary to treat or prevent imminent or life-threatening deterioration.  Critical care was time spent personally by me on the following activities: development of treatment plan with patient and/or surrogate as well as nursing, discussions with consultants, evaluation of patient's response to treatment, examination of patient, obtaining history from patient or surrogate, ordering and performing treatments and interventions, ordering and review of laboratory studies, ordering and review of radiographic studies, pulse oximetry and re-evaluation of patient's condition.   Admitted Diagnosis:  1.  Sepsis 2.  Hypotension 3.  Kidney Injury 4.  N/V/D  Johnna Acosta, MD 07/03/12 2207

## 2012-07-03 NOTE — ED Notes (Signed)
MD at bedside. 

## 2012-07-03 NOTE — Progress Notes (Signed)
Placed patient on 2L of O2 per NP order for 2L of O2 at night time. Will continue to monitor patient. Dale [name]

## 2012-07-03 NOTE — Progress Notes (Signed)
1815 Patient arrived to floor from ED via stretcher. Alert oriented made aware of surroundings. Placed on telemetry.Call bell in reach.Wife at bedside.Patient has an Insulin Pump.

## 2012-07-03 NOTE — ED Notes (Signed)
Antibiotics resumed after 2nd blood culture drawn.

## 2012-07-03 NOTE — ED Notes (Addendum)
Seen at Nephrology one day ago for monthly check up.  Came home and developed general body aches, fever, nausea, and diarrhea.  States kidney transplant patient and now kidney is failing.

## 2012-07-03 NOTE — Progress Notes (Signed)
ANTIBIOTIC CONSULT NOTE - INITIAL  Pharmacy Consult for Vancomycin/Cefepime Indication: rule out sepsis  Allergies  Allergen Reactions  . Morphine And Related Rash    nausea  . Amoxicillin Other (See Comments)    Reaction unknown  . Ciprofloxacin Other (See Comments)    Reaction unknown  . Clindamycin/Lincomycin Other (See Comments)    Reaction unknown  . Codeine Other (See Comments)    Reaction unknown. But pt has tolerated hydrocodone in Vicodin and Norco in the past  . Levofloxacin Other (See Comments)    Reaction unknown  . Vasotec Cough  . Oxycodone Rash    Patient Measurements: Height: 5\' 8"  (172.7 cm) Weight: 269 lb 9.6 oz (122.29 kg) IBW/kg (Calculated) : 68.4  Vital Signs: Temp: 102.9 F (39.4 C) (03/11 1451) Temp src: Rectal (03/11 1451) BP: 114/78 mmHg (03/11 1455) Pulse Rate: 98 (03/11 1455) Intake/Output from previous day:   Intake/Output from this shift:    Labs:  Recent Labs  07/03/12 1323  WBC 18.4*  HGB 11.4*  PLT 149*  CREATININE 6.60*   Estimated Creatinine Clearance: 17.8 ml/min (by C-G formula based on Cr of 6.6). No results found for this basename: VANCOTROUGH, VANCOPEAK, VANCORANDOM, GENTTROUGH, GENTPEAK, GENTRANDOM, TOBRATROUGH, TOBRAPEAK, TOBRARND, AMIKACINPEAK, AMIKACINTROU, AMIKACIN,  in the last 72 hours   Microbiology: No results found for this or any previous visit (from the past 720 hour(s)).  Medical History: Past Medical History  Diagnosis Date  . Insulin dependent diabetes mellitus   . Hypertension   . History of blood transfusion   . ESRD (end stage renal disease)     a. 1995 s/p cadaveric transplant w/ susbequent failure after 18 yrs;  b.Dialysis initiated 07/2011  . Dyspnea     a. 07/23/11 Echo: EF 55-60%  . Anemia of chronic disease   . Cholecystitis     a. 08/27/2011  . Constipation   . Pericardial effusion     a.  Small by CT 08/22/11;  b.  Large by CT 08/27/11  . Inguinal lymphadenopathy     a. bilateral - s/p  biopsy 07/2011    Assessment: 53 YOM with hx of renal transplant, type 1 DM, hypothyroidism presented with general body aches, fever, nausea and diarrhea. Pharmacy is consulted to start broad antibiotics for empiric coverage. Tm = 102.9, wbc elevated at 18.4, Blood/Urine cultures and C-diff PCR are pending. Per patient his transplanted kidney is failing. He has HD access, but he is currently not on dialysis since he still has some residual kidney function. His Scr is elevated at 6.6, est. crcl ~ 10 ml/min. Patient is allergic to multiple antibiotics, but he said he tolerated vancomycin before. He is allergic to Amoxicillin (rash), per previous medication history, he also tolerated Rocephin last year.   Goal of Therapy:  Vancomycin trough = 15-20 mcg/ml  Plan:  - Start Vancomycin 2g IV x 1 - then vancomycin 1750mg  IV Q 48 - Cefepime 1g IV Q 24hrs - f/u renal function, plans regarding starting HD and adjust vanc schedule if needed.  - F/u Cultures  Maryanna Shape, PharmD, BCPS  Clinical Pharmacist  Pager: [phone]   07/03/2012,3:10 PM

## 2012-07-03 NOTE — ED Notes (Signed)
Antibiotics stopped to obtain second blood culture

## 2012-07-03 NOTE — H&P (Addendum)
[facility] History and Physical  Jeffray Stoney [id number] DOB: Jan 17, 1966 DOA: 07/03/2012  Referring physician: Dr. Craig Staggers PCP: Windy Kalata, MD  Specialists: none  Chief Complaint: Myalgias and diarrhea  HPI: Dale [name] is a 47 y.o. male  History of renal transplant on immunosuppressants, diabetes mellitus type 1 on insulin pump, hypothyroidism, recently discharged from the hospital for chest pain on 06/19/2012 that comes in for myalgias and more than 20 bowel movements of watery stool with no blood in the last 24 hours. He relates he went and saw his primary care doctor and was fine. Suddenly the next daystarted having diarrhea with myalgias. His wife relates that she had minor flulike symptoms 1 week prior to admission but nothing as this. He relates he's had a temperature at home 100.3. He relates some dizziness upon standing. He also relates body aches, nothing makes these better or worst. He denies any abdominal pain.  Here in the ED or output cell count was elevated chest x-ray did not show any kind of infection, his blood pressure was low initially. They gave him a liter of normal saline and his blood pressure came up his tachycardia resolved. His lactic acid was 2.7 with a white count of 18 and a left shift we're asked to admit and further evaluate. He also got vancomycin cefepime here in the hospital IV for empiric antibiotic coverage.  Review of Systems: The patient denies  weight loss,, vision loss, decreased hearing, hoarseness, chest pain, syncope, dyspnea on exertion, peripheral edema, balance deficits, hemoptysis, abdominal pain, melena, hematochezia, severe indigestion/heartburn, hematuria, incontinence, genital sores, muscle weakness, suspicious skin lesions, transient blindness, difficulty walking, depression, unusual weight change, abnormal bleeding, enlarged lymph nodes, angioedema, and breast masses.    Past Medical History  Diagnosis Date  . Insulin dependent  diabetes mellitus   . Hypertension   . History of blood transfusion   . ESRD (end stage renal disease)     a. 1995 s/p cadaveric transplant w/ susbequent failure after 18 yrs;  b.Dialysis initiated 07/2011  . Dyspnea     a. 07/23/11 Echo: EF 55-60%  . Anemia of chronic disease   . Cholecystitis     a. 08/27/2011  . Constipation   . Pericardial effusion     a.  Small by CT 08/22/11;  b.  Large by CT 08/27/11  . Inguinal lymphadenopathy     a. bilateral - s/p biopsy 07/2011   Past Surgical History  Procedure Laterality Date  . Appendectomy    . Hernia repair    . Kidney transplant    . Cataract extraction    . Arteriovenous graft placement    . Av fistula placement    . Toe amputation    . Below knee leg amputation    . Carpal tunnel release    . Shoulder surgery    . Av fistula placement  07/29/2011    Procedure: ARTERIOVENOUS (AV) FISTULA CREATION;  Surgeon: Mal Misty, MD;  Location: [place];  Service: Vascular;  Laterality: Right;  Brachial cephalic  . Insertion of dialysis catheter  08/01/2011    Procedure: INSERTION OF DIALYSIS CATHETER;  Surgeon: Rosetta Posner, MD;  Location: [place];  Service: Vascular;  Laterality: Right;  insertion of dialysis catheter on right internal jugular vein  . Lymph node biopsy  08/10/2011    Procedure: LYMPH NODE BIOPSY;  Surgeon: Merrie Roof, MD;  Location: [place];  Service: General;  Laterality: Left;  left groin lymph biopsy  .  Colonoscopy  08/29/2011    Procedure: COLONOSCOPY;  Surgeon: Jerene Bears, MD;  Location: [place];  Service: Gastroenterology;  Laterality: N/A;   Social History:  reports that he has never smoked. He has never used smokeless tobacco. He reports that he does not drink alcohol or use illicit drugs. Is at home with wife can perform most of his ADLs  Allergies  Allergen Reactions  . Morphine And Related Rash    nausea  . Amoxicillin Other (See Comments)    Reaction unknown  . Ciprofloxacin Other (See Comments)     Reaction unknown  . Clindamycin/Lincomycin Other (See Comments)    Reaction unknown  . Codeine Other (See Comments)    Reaction unknown. But pt has tolerated hydrocodone in Vicodin and Norco in the past  . Levofloxacin Other (See Comments)    Reaction unknown  . Vasotec Cough  . Oxycodone Rash    Family History  Problem Relation Age of Onset  . Malignant hyperthermia Neg Hx   . Colon polyps Father   . Diabetes Father   . Cancer Father   . Breast cancer Maternal Aunt   . Cancer Maternal Aunt     Breast and Bone    Prior to Admission medications   Medication Sig Start Date End Date Taking? Authorizing Provider  allopurinol (ZYLOPRIM) 100 MG tablet Take 200 mg by mouth daily. 2 Tabs 03/14/12  Yes Historical Provider, MD  amLODipine (NORVASC) 5 MG tablet Take 1 tablet (5 mg total) by mouth daily. 06/19/12  Yes Modena Jansky, MD  aspirin 81 MG tablet Take 81 mg by mouth every evening.    Yes Historical Provider, MD  calcium acetate (PHOSLO) 667 MG capsule Take 1,334-2,001 mg by mouth 5 (five) times daily. 2001 mg with meals and 1334 mg with snacks 08/13/11 08/12/12 Yes Nita Sells, MD  carvedilol (COREG) 6.25 MG tablet Take 12.5 mg by mouth 2 (two) times daily with a meal.   Yes Historical Provider, MD  cycloSPORINE (SANDIMMUNE) 25 MG capsule Take 75 mg by mouth 2 (two) times daily.   Yes Historical Provider, MD  cycloSPORINE modified (NEORAL/GENGRAF) 100 MG capsule Take 100 mg by mouth 2 (two) times daily. Neoral brand 06/26/11  Yes Historical Provider, MD  diphenhydrAMINE (BENADRYL) 25 mg capsule Take 50 mg by mouth at bedtime as needed for itching or sleep.   Yes Historical Provider, MD  docusate sodium (COLACE) 100 MG capsule Take 200 mg by mouth 3 (three) times daily.   Yes Historical Provider, MD  furosemide (LASIX) 80 MG tablet 160 mg 3 (three) times daily.  02/09/12  Yes Historical Provider, MD  GLUCAGON EMERGENCY 1 MG injection Inject 1 mg into the muscle once as needed.   01/19/12  Yes Historical Provider, MD  HYDROmorphone (DILAUDID) 2 MG tablet Take 2 mg by mouth 4 (four) times daily as needed. For pain 02/26/12  Yes Historical Provider, MD  Insulin Human (INSULIN PUMP) 100 unit/ml SOLN Inject into the skin continuous. Humalog   Yes Historical Provider, MD  levothyroxine (SYNTHROID, LEVOTHROID) 88 MCG tablet Take 88 mcg by mouth daily.   Yes Historical Provider, MD  loratadine (CLARITIN) 10 MG tablet Take 10 mg by mouth daily.   Yes Historical Provider, MD  metoCLOPramide (REGLAN) 5 MG tablet Take 5 mg by mouth 4 (four) times daily - after meals and at bedtime. 05/26/11  Yes Historical Provider, MD  nitroGLYCERIN (NITROSTAT) 0.4 MG SL tablet Place 0.4 mg under the tongue every 5 (  five) minutes as needed for chest pain.  09/06/11 09/05/12 Yes [name], MD  predniSONE (DELTASONE) 5 MG tablet Take 5 mg by mouth Daily. 06/26/11  Yes Historical Provider, MD  promethazine (PHENERGAN) 25 MG tablet Take 25 mg by mouth Every 6 hours as needed. For nausea 07/13/11  Yes Historical Provider, MD  PROVENTIL HFA 108 (90 BASE) MCG/ACT inhaler Inhale 1 puff into the lungs Every 6 hours as needed. For shortness of breath 05/17/11  Yes Historical Provider, MD  ranitidine (ZANTAC) 150 MG tablet Take 1 tablet (150 mg total) by mouth 2 (two) times daily. 03/26/12 03/26/13 Yes Jerene Bears, MD  rOPINIRole (REQUIP) 1 MG tablet Take 1 mg by mouth Every evening. Take at 2000 07/18/11  Yes Historical Provider, MD  epoetin alfa (EPOGEN,PROCRIT) 09811 UNIT/ML injection Inject 40,000 Units into the skin every 21 ( twenty-one) days. If hemoglobin is below 12    Historical Provider, MD   Physical Exam: Filed Vitals:   07/03/12 1318 07/03/12 1451 07/03/12 1455 07/03/12 1500  BP: 92/42  114/78   Pulse: 100  98   Temp: 99.8 F (37.7 C) 102.9 F (39.4 C)    TempSrc: Oral Rectal    Resp: 20     Height:    5\' 8"  (1.727 m)  Weight:    124.286 kg (274 lb)  SpO2: 97%  98%    BP 114/78  Pulse 98   Temp(Src) 102.9 F (39.4 C) (Rectal)  Resp 20  Ht 5\' 8"  (1.727 m)  Wt 124.286 kg (274 lb)  BMI 41.67 kg/m2  SpO2 98%  General Appearance:    Alert, cooperative, no distress, appears stated age  Head:    Normocephalic, without obvious abnormality, atraumatic  Eyes:    PERRL, conjunctiva/corneas clear, EOM's intact, fundi    benign, both eyes       Ears:    Normal TM's and external ear canals, both ears  Nose:   Nares normal, septum midline, mucosa normal, no drainage    or sinus tenderness  Throat:   Lips, mucosa, and tongue normal; teeth and gums normal  Neck:   Supple, symmetrical, trachea midline, no adenopathy;       thyroid:  No enlargement/tenderness/nodules; no carotid   bruit or JVD  Back:     Symmetric, no curvature, ROM normal, no CVA tenderness  Lungs:     Clear to auscultation bilaterally, respirations unlabored  Chest wall:    No tenderness or deformity  Heart:    Regular rate and rhythm, S1 and S2 normal, no murmur, rub   or gallop  Abdomen:     Soft, non-tender, bowel sounds active all four quadrants,    no masses, no organomegaly  Genitalia:    Normal male without lesion, discharge or tenderness  Rectal:    Normal tone, normal prostate, no masses or tenderness;   guaiac negative stool  Extremities:   Extremities normal, atraumatic, no cyanosis or edema  Pulses:   2+ and symmetric all extremities  Skin:   Skin color, texture, turgor normal, no rashes or lesions  Lymph nodes:   Cervical, supraclavicular, and axillary nodes normal  Neurologic:   CNII-XII intact. Normal strength, sensation and reflexes      throughout    Labs on Admission:  Basic Metabolic Panel:  Recent Labs Lab 07/03/12 1323  NA 132*  K 3.8  CL 96  CO2 18*  GLUCOSE 188*  BUN 110*  CREATININE 6.60*  CALCIUM 8.9  Liver Function Tests:  Recent Labs Lab 07/03/12 1323  AST 19  ALT 10  ALKPHOS 67  BILITOT 0.4  PROT 7.3  ALBUMIN 2.3*   No results found for this basename: LIPASE,  AMYLASE,  in the last 168 hours No results found for this basename: AMMONIA,  in the last 168 hours CBC:  Recent Labs Lab 07/03/12 1323  WBC 18.4*  NEUTROABS 17.0*  HGB 11.4*  HCT 34.7*  MCV 97.2  PLT 149*   Cardiac Enzymes: No results found for this basename: CKTOTAL, CKMB, CKMBINDEX, TROPONINI,  in the last 168 hours  BNP (last 3 results) No results found for this basename: PROBNP,  in the last 8760 hours CBG: No results found for this basename: GLUCAP,  in the last 168 hours  Radiological Exams on Admission: Dg Chest 2 View  07/03/2012  *RADIOLOGY REPORT*  Clinical Data: Fever and nausea  CHEST - 2 VIEW  Comparison: June 17, 2012  Findings: The lungs are clear.  The heart size and pulmonary vascularity are within normal limits.  No adenopathy.  There is degenerative change in the thoracic spine.  IMPRESSION: No edema or consolidation.   Original Report Authenticated By: Lowella Grip, M.D.     EKG: Independently reviewed. Sinus tachycardia  Assessment/Plan Fever and chills/ Diarrhea/ Leukocytosis - He has not been on any recent antibiotics, but he he has been in the hospital about one week ago, and he is on chronic immunosuppressants, so high in the differential would be Clostridium difficile colitis, he does have a white count of 18 and a worsening creatinine. So we'll start him empirically on Vancomycin and Flagyl, we'll check a C. Difficile PCR. I will go ahead and stop the Antibiotic started by the emergency room physician.  - Check orthostatics, I agree with IV fluids per the ED physician. We'll decrease the rate monitor strict I.'s and O's. And recheck a basic metabolic panel in the morning.  AKI (acute kidney injury)/History of renal transplant - His BUN is greater than 100 and his creatinine has increased by 1 point,which is not that significant as his baseline creatinine is around 5. He's had more than 20 bowel movements in the last 12 hours which could be  contributing to his acute kidney injury. - His renal function has been worsening over the years. He does not have a rub on physical exam. I will go ahead and consult nephrology for further evaluation of possible dialysis evaluation to   Obstructive sleep apnea: - CPAP her home settings.    Diabetes mellitus: - continue insulin pump per patient's management. - Low-carb modified diet.  CBG's a.c. And at bedtime.   Code Status: full Family Communication: wife Disposition Plan: inpatient  Time spent: 35 minutes  Charlynne Cousins [facility] Pager [phone]  If 7PM-7AM, please contact night-coverage [email or web address] Password [facility] 07/03/2012, 3:51 PM

## 2012-07-03 NOTE — ED Notes (Signed)
Results of lactic acid shown to Dr. Sabra Heck

## 2012-07-03 NOTE — Consult Note (Signed)
I have personally seen and examined this patient and agree with the assessment/plan as outlined above by Charlann Lange MD (PGY2). Mr.Belk is here with what appears to an acute gastroenteritis and some probable worsening of renal function- ongoing lab studies and placed on BSABx. Will start gentle IVFs and follow closely for emerging HD needs.  Damiah Mcdonald K.,MD 07/03/2012 5:17 PM

## 2012-07-03 NOTE — Progress Notes (Signed)
1900 Telemetry strip revealed Sinus Tach rate 113.

## 2012-07-04 ENCOUNTER — Encounter (HOSPITAL_COMMUNITY): Payer: Self-pay | Admitting: General Surgery

## 2012-07-04 ENCOUNTER — Inpatient Hospital Stay (HOSPITAL_COMMUNITY): Payer: Medicare Other

## 2012-07-04 ENCOUNTER — Encounter: Payer: Medicare Other | Admitting: Cardiovascular Disease

## 2012-07-04 DIAGNOSIS — A419 Sepsis, unspecified organism: Secondary | ICD-10-CM

## 2012-07-04 DIAGNOSIS — N186 End stage renal disease: Secondary | ICD-10-CM

## 2012-07-04 DIAGNOSIS — D72829 Elevated white blood cell count, unspecified: Secondary | ICD-10-CM

## 2012-07-04 DIAGNOSIS — R141 Gas pain: Secondary | ICD-10-CM

## 2012-07-04 DIAGNOSIS — R109 Unspecified abdominal pain: Secondary | ICD-10-CM

## 2012-07-04 DIAGNOSIS — E872 Acidosis, unspecified: Secondary | ICD-10-CM

## 2012-07-04 DIAGNOSIS — J9600 Acute respiratory failure, unspecified whether with hypoxia or hypercapnia: Secondary | ICD-10-CM | POA: Diagnosis not present

## 2012-07-04 DIAGNOSIS — R6521 Severe sepsis with septic shock: Secondary | ICD-10-CM

## 2012-07-04 DIAGNOSIS — R142 Eructation: Secondary | ICD-10-CM

## 2012-07-04 DIAGNOSIS — R6520 Severe sepsis without septic shock: Secondary | ICD-10-CM

## 2012-07-04 DIAGNOSIS — R143 Flatulence: Secondary | ICD-10-CM

## 2012-07-04 LAB — PROCALCITONIN: Procalcitonin: >175 ng/mL

## 2012-07-04 LAB — CBC
HCT: 34 % — ABNORMAL LOW (ref 39.0–52.0)
HCT: 30.9 % — ABNORMAL LOW (ref 39.0–52.0)
Hemoglobin: 11.2 g/dL — ABNORMAL LOW (ref 13.0–17.0)
Hemoglobin: 10.4 g/dL — ABNORMAL LOW (ref 13.0–17.0)
MCH: 32.2 pg (ref 26.0–34.0)
MCH: 32.1 pg (ref 26.0–34.0)
MCHC: 32.9 g/dL (ref 30.0–36.0)
MCHC: 33.7 g/dL (ref 30.0–36.0)
MCV: 97.7 fL (ref 78.0–100.0)
MCV: 95.4 fL (ref 78.0–100.0)
Platelets: 122 10*3/uL — ABNORMAL LOW (ref 150–400)
Platelets: DECREASED 10*3/uL (ref 150–400)
RBC: 3.48 MIL/uL — ABNORMAL LOW (ref 4.22–5.81)
RBC: 3.24 MIL/uL — ABNORMAL LOW (ref 4.22–5.81)
RDW: 16 % — ABNORMAL HIGH (ref 11.5–15.5)
RDW: 15.8 % — ABNORMAL HIGH (ref 11.5–15.5)
WBC: 10.6 10*3/uL — ABNORMAL HIGH (ref 4.0–10.5)
WBC: 9.3 10*3/uL (ref 4.0–10.5)

## 2012-07-04 LAB — COMPREHENSIVE METABOLIC PANEL
ALT: 12 U/L (ref 0–53)
ALT: 13 U/L (ref 0–53)
AST: 42 U/L — ABNORMAL HIGH (ref 0–37)
AST: 57 U/L — ABNORMAL HIGH (ref 0–37)
Albumin: 2.2 g/dL — ABNORMAL LOW (ref 3.5–5.2)
Albumin: 2 g/dL — ABNORMAL LOW (ref 3.5–5.2)
Alkaline Phosphatase: 59 U/L (ref 39–117)
Alkaline Phosphatase: 51 U/L (ref 39–117)
BUN: 114 mg/dL — ABNORMAL HIGH (ref 6–23)
BUN: 98 mg/dL — ABNORMAL HIGH (ref 6–23)
CO2: 14 mEq/L — ABNORMAL LOW (ref 19–32)
CO2: 17 mEq/L — ABNORMAL LOW (ref 19–32)
Calcium: 8.3 mg/dL — ABNORMAL LOW (ref 8.4–10.5)
Calcium: 8 mg/dL — ABNORMAL LOW (ref 8.4–10.5)
Chloride: 100 mEq/L (ref 96–112)
Chloride: 102 mEq/L (ref 96–112)
Creatinine, Ser: 7.4 mg/dL — ABNORMAL HIGH (ref 0.50–1.35)
Creatinine, Ser: 6.62 mg/dL — ABNORMAL HIGH (ref 0.50–1.35)
GFR calc Af Amer: 9 mL/min — ABNORMAL LOW (ref >90)
GFR calc Af Amer: 10 mL/min — ABNORMAL LOW (ref >90)
GFR calc non Af Amer: 8 mL/min — ABNORMAL LOW (ref >90)
GFR calc non Af Amer: 9 mL/min — ABNORMAL LOW (ref >90)
Glucose, Bld: 125 mg/dL — ABNORMAL HIGH (ref 70–99)
Glucose, Bld: 90 mg/dL (ref 70–99)
Potassium: 3.1 mEq/L — ABNORMAL LOW (ref 3.5–5.1)
Potassium: 2.9 mEq/L — ABNORMAL LOW (ref 3.5–5.1)
Sodium: 137 mEq/L (ref 135–145)
Sodium: 137 mEq/L (ref 135–145)
Total Bilirubin: 0.4 mg/dL (ref 0.3–1.2)
Total Bilirubin: 0.4 mg/dL (ref 0.3–1.2)
Total Protein: 6.9 g/dL (ref 6.0–8.3)
Total Protein: 6.5 g/dL (ref 6.0–8.3)

## 2012-07-04 LAB — TROPONIN I: Troponin I: 0.6 ng/mL (ref <0.30)

## 2012-07-04 LAB — URINE MICROSCOPIC-ADD ON

## 2012-07-04 LAB — BLOOD GAS, ARTERIAL
Acid-base deficit: 11.2 mmol/L — ABNORMAL HIGH (ref 0.0–2.0)
Bicarbonate: 12.3 mEq/L — ABNORMAL LOW (ref 20.0–24.0)
Drawn by: 24910
O2 Content: 2 L/min
O2 Saturation: 96.6 %
Patient temperature: 105
TCO2: 12.9 mmol/L (ref 0–100)
pCO2 arterial: 22.2 mmHg — ABNORMAL LOW (ref 35.0–45.0)
pH, Arterial: 7.381 (ref 7.350–7.450)
pO2, Arterial: 113 mmHg — ABNORMAL HIGH (ref 80.0–100.0)

## 2012-07-04 LAB — URINALYSIS, ROUTINE W REFLEX MICROSCOPIC
Bilirubin Urine: NEGATIVE
Glucose, UA: NEGATIVE mg/dL
Ketones, ur: 15 mg/dL — AB
Nitrite: NEGATIVE
Protein, ur: >300 mg/dL — AB
Specific Gravity, Urine: 1.018 (ref 1.005–1.030)
Urobilinogen, UA: 0.2 mg/dL (ref 0.0–1.0)
pH: 5.5 (ref 5.0–8.0)

## 2012-07-04 LAB — GLUCOSE, CAPILLARY
Glucose-Capillary: 111 mg/dL — ABNORMAL HIGH (ref 70–99)
Glucose-Capillary: 95 mg/dL (ref 70–99)
Glucose-Capillary: 73 mg/dL (ref 70–99)
Glucose-Capillary: 79 mg/dL (ref 70–99)
Glucose-Capillary: 89 mg/dL (ref 70–99)
Glucose-Capillary: 130 mg/dL — ABNORMAL HIGH (ref 70–99)
Glucose-Capillary: 260 mg/dL — ABNORMAL HIGH (ref 70–99)

## 2012-07-04 LAB — POCT I-STAT 3, ART BLOOD GAS (G3+)
Acid-base deficit: 3 mmol/L — ABNORMAL HIGH (ref 0.0–2.0)
Bicarbonate: 20.1 mEq/L (ref 20.0–24.0)
O2 Saturation: 100 %
Patient temperature: 100
TCO2: 21 mmol/L (ref 0–100)
pCO2 arterial: 30.4 mmHg — ABNORMAL LOW (ref 35.0–45.0)
pH, Arterial: 7.431 (ref 7.350–7.450)
pO2, Arterial: 399 mmHg — ABNORMAL HIGH (ref 80.0–100.0)

## 2012-07-04 LAB — INFLUENZA PANEL BY PCR (TYPE A & B)
H1N1 flu by pcr: NOT DETECTED
Influenza A By PCR: NEGATIVE
Influenza B By PCR: NEGATIVE

## 2012-07-04 LAB — TYPE AND SCREEN
ABO/RH(D): A POS
Antibody Screen: NEGATIVE

## 2012-07-04 LAB — PHOSPHORUS: Phosphorus: 3.6 mg/dL (ref 2.3–4.6)

## 2012-07-04 LAB — HEMOGLOBIN AND HEMATOCRIT, BLOOD
HCT: 30.5 % — ABNORMAL LOW (ref 39.0–52.0)
Hemoglobin: 9.9 g/dL — ABNORMAL LOW (ref 13.0–17.0)

## 2012-07-04 LAB — LACTIC ACID, PLASMA: Lactic Acid, Venous: 0.9 mmol/L (ref 0.5–2.2)

## 2012-07-04 LAB — CORTISOL: Cortisol, Plasma: 15.3 ug/dL

## 2012-07-04 LAB — HEPATITIS B SURFACE ANTIGEN: Hepatitis B Surface Ag: NEGATIVE

## 2012-07-04 LAB — CARBOXYHEMOGLOBIN
Carboxyhemoglobin: 1.1 % (ref 0.5–1.5)
Methemoglobin: 2.2 % — ABNORMAL HIGH (ref 0.0–1.5)
O2 Saturation: 74 %
Total hemoglobin: 12.2 g/dL — ABNORMAL LOW (ref 13.5–18.0)

## 2012-07-04 LAB — HEPATITIS B CORE ANTIBODY, TOTAL: Hep B Core Total Ab: NEGATIVE

## 2012-07-04 LAB — FIBRINOGEN: Fibrinogen: >800 mg/dL — ABNORMAL HIGH (ref 204–475)

## 2012-07-04 LAB — TSH: TSH: 3.013 u[IU]/mL (ref 0.350–4.500)

## 2012-07-04 LAB — PROTIME-INR
INR: 1.63 — ABNORMAL HIGH (ref 0.00–1.49)
Prothrombin Time: 18.8 seconds — ABNORMAL HIGH (ref 11.6–15.2)

## 2012-07-04 LAB — APTT: aPTT: 44 seconds — ABNORMAL HIGH (ref 24–37)

## 2012-07-04 MED ORDER — DEXTROSE 50 % IV SOLN: 25.0000 mL | INTRAVENOUS | Status: DC | PRN

## 2012-07-04 MED ORDER — DEXTROSE 5 % IV SOLN
1.0000 g | INTRAVENOUS | Status: DC
  Administered 2012-07-04 – 2012-07-07 (×4): 1 g via INTRAVENOUS
  Filled 2012-07-04 (×5): qty 1

## 2012-07-04 MED ORDER — FENTANYL BOLUS VIA INFUSION
25.0000 ug | Freq: Four times a day (QID) | INTRAVENOUS | Status: DC | PRN
  Administered 2012-07-05 – 2012-07-06 (×3): 50 ug via INTRAVENOUS
  Filled 2012-07-04: qty 100

## 2012-07-04 MED ORDER — FENTANYL CITRATE 0.05 MG/ML IJ SOLN
INTRAMUSCULAR | Status: AC
  Administered 2012-07-04: 100 ug
  Filled 2012-07-04: qty 2

## 2012-07-04 MED ORDER — MIDAZOLAM HCL 2 MG/2ML IJ SOLN
INTRAMUSCULAR | Status: AC
  Administered 2012-07-04: 2 mg
  Filled 2012-07-04: qty 2

## 2012-07-04 MED ORDER — NEPRO/CARBSTEADY PO LIQD
237.0000 mL | ORAL | Status: DC | PRN
  Filled 2012-07-04: qty 237

## 2012-07-04 MED ORDER — IOHEXOL 300 MG/ML  SOLN
25.0000 mL | INTRAMUSCULAR | Status: AC
  Administered 2012-07-04 (×2): 25 mL via ORAL

## 2012-07-04 MED ORDER — SODIUM CHLORIDE 0.9 % IV SOLN
INTRAVENOUS | Status: AC
  Administered 2012-07-04: 0.1 [IU]/h via INTRAVENOUS
  Administered 2012-07-04: 3.4 [IU]/h via INTRAVENOUS
  Filled 2012-07-04 (×2): qty 1

## 2012-07-04 MED ORDER — MIDAZOLAM BOLUS VIA INFUSION
1.0000 mg | INTRAVENOUS | Status: DC | PRN
  Filled 2012-07-04: qty 2

## 2012-07-04 MED ORDER — INSULIN REGULAR BOLUS VIA INFUSION
0.0000 [IU] | Freq: Three times a day (TID) | INTRAVENOUS | Status: DC
  Administered 2012-07-04: 0 [IU] via INTRAVENOUS
  Filled 2012-07-04: qty 10

## 2012-07-04 MED ORDER — VANCOMYCIN HCL IN DEXTROSE 1-5 GM/200ML-% IV SOLN
1000.0000 mg | Freq: Once | INTRAVENOUS | Status: AC
  Administered 2012-07-04: 1000 mg via INTRAVENOUS
  Filled 2012-07-04: qty 200

## 2012-07-04 MED ORDER — SODIUM CHLORIDE 0.9 % IV SOLN: 100.0000 mL | INTRAVENOUS | Status: DC | PRN

## 2012-07-04 MED ORDER — SODIUM CHLORIDE 0.9 % IV SOLN
25.0000 ug/h | INTRAVENOUS | Status: DC
  Administered 2012-07-04 – 2012-07-06 (×3): 50 ug/h via INTRAVENOUS
  Filled 2012-07-04 (×2): qty 50

## 2012-07-04 MED ORDER — BIOTENE DRY MOUTH MT LIQD
15.0000 mL | Freq: Four times a day (QID) | OROMUCOSAL | Status: DC
  Administered 2012-07-05 – 2012-07-08 (×14): 15 mL via OROMUCOSAL

## 2012-07-04 MED ORDER — CYCLOSPORINE MODIFIED (NEORAL) 100 MG/ML PO SOLN
175.0000 mg | Freq: Two times a day (BID) | ORAL | Status: DC
  Administered 2012-07-04 – 2012-07-08 (×8): 175 mg via ORAL
  Administered 2012-07-08: 100 mg via ORAL
  Filled 2012-07-04 (×14): qty 1.8

## 2012-07-04 MED ORDER — ALTEPLASE 2 MG IJ SOLR
2.0000 mg | Freq: Once | INTRAMUSCULAR | Status: DC | PRN
  Filled 2012-07-04: qty 2

## 2012-07-04 MED ORDER — LIDOCAINE-PRILOCAINE 2.5-2.5 % EX CREA
1.0000 "application " | TOPICAL_CREAM | CUTANEOUS | Status: DC | PRN
  Filled 2012-07-04: qty 5

## 2012-07-04 MED ORDER — SODIUM CHLORIDE 0.9 % IV SOLN
INTRAVENOUS | Status: DC
  Administered 2012-07-04 (×2): via INTRAVENOUS

## 2012-07-04 MED ORDER — DEXTROSE-NACL 5-0.45 % IV SOLN
INTRAVENOUS | Status: DC
  Administered 2012-07-04: 16:00:00 via INTRAVENOUS

## 2012-07-04 MED ORDER — METRONIDAZOLE IN NACL 5-0.79 MG/ML-% IV SOLN
500.0000 mg | Freq: Three times a day (TID) | INTRAVENOUS | Status: DC
  Administered 2012-07-04 – 2012-07-07 (×10): 500 mg via INTRAVENOUS
  Filled 2012-07-04 (×13): qty 100

## 2012-07-04 MED ORDER — PENTAFLUOROPROP-TETRAFLUOROETH EX AERO: 1.0000 "application " | INHALATION_SPRAY | CUTANEOUS | Status: DC | PRN

## 2012-07-04 MED ORDER — HEPARIN SODIUM (PORCINE) 1000 UNIT/ML DIALYSIS: 1000.0000 [IU] | INTRAMUSCULAR | Status: DC | PRN

## 2012-07-04 MED ORDER — SODIUM CHLORIDE 0.9 % IV BOLUS (SEPSIS): 1000.0000 mL | INTRAVENOUS | Status: DC | PRN

## 2012-07-04 MED ORDER — CHLORHEXIDINE GLUCONATE 0.12 % MT SOLN
15.0000 mL | Freq: Two times a day (BID) | OROMUCOSAL | Status: DC
  Administered 2012-07-04 – 2012-07-08 (×8): 15 mL via OROMUCOSAL
  Filled 2012-07-04 (×9): qty 15

## 2012-07-04 MED ORDER — SODIUM CHLORIDE 0.9 % IV SOLN
1.0000 mg/h | INTRAVENOUS | Status: DC
  Administered 2012-07-04: 3 mg/h via INTRAVENOUS
  Administered 2012-07-04: 2 mg/h via INTRAVENOUS
  Administered 2012-07-04: 3 mg/h via INTRAVENOUS
  Filled 2012-07-04 (×2): qty 10

## 2012-07-04 MED ORDER — LIDOCAINE HCL (PF) 1 % IJ SOLN: 5.0000 mL | INTRAMUSCULAR | Status: DC | PRN

## 2012-07-04 NOTE — Procedures (Signed)
Arterial Catheter Insertion Procedure Note Dale [name] [id number] June 29, 1965  Procedure: Insertion of Arterial Catheter  Indications: Blood pressure monitoring and Frequent blood sampling  Procedure Details Consent: Unable to obtain consent because of emergent medical necessity. Time Out: Verified patient identification, verified procedure, site/side was marked, verified correct patient position, special equipment/implants available, medications/allergies/relevent history reviewed, required imaging and test results available.  Performed  Maximum sterile technique was used including antiseptics, cap, gloves, gown, hand hygiene, mask and sheet. Skin prep: Chlorhexidine; local anesthetic administered 20 gauge catheter was inserted into left radial artery using the Seldinger technique.  Evaluation Blood flow good; BP tracing good. Complications: No apparent complications.   Dale [name] 07/04/2012

## 2012-07-04 NOTE — Consult Note (Signed)
PULMONARY  / CRITICAL CARE MEDICINE  Name: Dale [name] MRN: [id number] DOB: 03/22/1966    ADMISSION DATE:  07/03/2012 CONSULTATION DATE:  07/04/12  REFERRING MD :  TRH  CHIEF COMPLAINT:  Respiratory failure and septic shock.  BRIEF PATIENT DESCRIPTION: 47 year old male diabetic with HTN and ESRD s/p transplant with baseline cr of 5 that now increased to 7 with abdominal distension and severe diarrhea.  PCCM was called on 3/12 due to increased work of breathing, hypotension, increased abdominal distension and feculent NGT aspirate.  SIGNIFICANT EVENTS / STUDIES:  3/12 VDRF and septic shock.  LINES / TUBES: L IJ TLC 3/12>>> ET tube 3/12>>> Foley 3/12>>> NGT 3/12>>>  CULTURES: Blood 3/12>>> Urine 3/12>>> Sputum 3/12>>> C. diff 3/12>>>  ANTIBIOTICS: Vanc 3/12>>> Flagyl 3/11>>> Cefepime 3/11>>> Tamiflu 3/10>>>  PAST MEDICAL HISTORY :  Past Medical History  Diagnosis Date  . Insulin dependent diabetes mellitus   . Hypertension   . History of blood transfusion   . ESRD (end stage renal disease)     a. 1995 s/p cadaveric transplant w/ susbequent failure after 18 yrs;  b.Dialysis initiated 07/2011  . Dyspnea     a. 07/23/11 Echo: EF 55-60%  . Anemia of chronic disease   . Cholecystitis     a. 08/27/2011  . Constipation   . Pericardial effusion     a.  Small by CT 08/22/11;  b.  Large by CT 08/27/11  . Inguinal lymphadenopathy     a. bilateral - s/p biopsy 07/2011   Past Surgical History  Procedure Laterality Date  . Appendectomy    . Hernia repair    . Kidney transplant    . Cataract extraction    . Arteriovenous graft placement    . Av fistula placement    . Toe amputation    . Below knee leg amputation    . Carpal tunnel release    . Shoulder surgery    . Av fistula placement  07/29/2011    Procedure: ARTERIOVENOUS (AV) FISTULA CREATION;  Surgeon: Mal Misty, MD;  Location: [facility];  Service: Vascular;  Laterality: Right;  Brachial cephalic  . Insertion of dialysis  catheter  08/01/2011    Procedure: INSERTION OF DIALYSIS CATHETER;  Surgeon: Rosetta Posner, MD;  Location: [facility];  Service: Vascular;  Laterality: Right;  insertion of dialysis catheter on right internal jugular vein  . Lymph node biopsy  08/10/2011    Procedure: LYMPH NODE BIOPSY;  Surgeon: Merrie Roof, MD;  Location: [facility];  Service: General;  Laterality: Left;  left groin lymph biopsy  . Colonoscopy  08/29/2011    Procedure: COLONOSCOPY;  Surgeon: Jerene Bears, MD;  Location: [facility];  Service: Gastroenterology;  Laterality: N/A;   Prior to Admission medications   Medication Sig Start Date End Date Taking? Authorizing Provider  allopurinol (ZYLOPRIM) 100 MG tablet Take 200 mg by mouth daily. 2 Tabs 03/14/12  Yes Historical Provider, MD  amLODipine (NORVASC) 5 MG tablet Take 1 tablet (5 mg total) by mouth daily. 06/19/12  Yes Modena Jansky, MD  aspirin 81 MG tablet Take 81 mg by mouth every evening.    Yes Historical Provider, MD  calcium acetate (PHOSLO) 667 MG capsule Take 1,334-2,001 mg by mouth 5 (five) times daily. 2001 mg with meals and 1334 mg with snacks 08/13/11 08/12/12 Yes Nita Sells, MD  carvedilol (COREG) 6.25 MG tablet Take 12.5 mg by mouth 2 (two) times daily with a meal.  Yes Historical Provider, MD  cycloSPORINE modified (NEORAL) 25 MG capsule Take 75 mg by mouth 2 (two) times daily. Takes with 100 mg capsules for total 175 mg BID   Yes Historical Provider, MD  cycloSPORINE modified (NEORAL/GENGRAF) 100 MG capsule Take 100 mg by mouth 2 (two) times daily. Neoral brand 06/26/11  Yes Historical Provider, MD  diphenhydrAMINE (BENADRYL) 25 mg capsule Take 50 mg by mouth at bedtime as needed for itching or sleep.   Yes Historical Provider, MD  docusate sodium (COLACE) 100 MG capsule Take 200 mg by mouth 3 (three) times daily.   Yes Historical Provider, MD  furosemide (LASIX) 80 MG tablet 160 mg 3 (three) times daily.  02/09/12  Yes Historical Provider, MD  GLUCAGON  EMERGENCY 1 MG injection Inject 1 mg into the muscle once as needed.  01/19/12  Yes Historical Provider, MD  HYDROmorphone (DILAUDID) 2 MG tablet Take 2 mg by mouth 4 (four) times daily as needed. For pain 02/26/12  Yes Historical Provider, MD  Insulin Human (INSULIN PUMP) 100 unit/ml SOLN Inject into the skin continuous. Humalog   Yes Historical Provider, MD  levothyroxine (SYNTHROID, LEVOTHROID) 88 MCG tablet Take 88 mcg by mouth daily.   Yes Historical Provider, MD  loratadine (CLARITIN) 10 MG tablet Take 10 mg by mouth daily.   Yes Historical Provider, MD  metoCLOPramide (REGLAN) 5 MG tablet Take 5 mg by mouth 4 (four) times daily - after meals and at bedtime. 05/26/11  Yes Historical Provider, MD  nitroGLYCERIN (NITROSTAT) 0.4 MG SL tablet Place 0.4 mg under the tongue every 5 (five) minutes as needed for chest pain.  09/06/11 09/05/12 Yes [name], MD  predniSONE (DELTASONE) 5 MG tablet Take 5 mg by mouth Daily. 06/26/11  Yes Historical Provider, MD  promethazine (PHENERGAN) 25 MG tablet Take 25 mg by mouth Every 6 hours as needed. For nausea 07/13/11  Yes Historical Provider, MD  PROVENTIL HFA 108 (90 BASE) MCG/ACT inhaler Inhale 1 puff into the lungs Every 6 hours as needed. For shortness of breath 05/17/11  Yes Historical Provider, MD  ranitidine (ZANTAC) 150 MG tablet Take 1 tablet (150 mg total) by mouth 2 (two) times daily. 03/26/12 03/26/13 Yes Jerene Bears, MD  rOPINIRole (REQUIP) 1 MG tablet Take 1 mg by mouth Every evening. Take at 2000 07/18/11  Yes Historical Provider, MD  epoetin alfa (EPOGEN,PROCRIT) 28413 UNIT/ML injection Inject 40,000 Units into the skin every 21 ( twenty-one) days. If hemoglobin is below 12    Historical Provider, MD   Allergies  Allergen Reactions  . Morphine And Related Hives and Nausea Only  . Amoxicillin Other (See Comments)    Reaction unknown  . Ciprofloxacin Other (See Comments)    Reaction unknown  . Clindamycin/Lincomycin Other (See Comments)     Reaction unknown  . Codeine Other (See Comments)    Reaction unknown. But pt has tolerated hydrocodone in Vicodin and Norco in the past  . Levofloxacin Other (See Comments)    Reaction unknown  . Vasotec Cough  . Oxycodone Rash    FAMILY HISTORY:  Family History  Problem Relation Age of Onset  . Malignant hyperthermia Neg Hx   . Colon polyps Father   . Diabetes Father   . Cancer Father   . Breast cancer Maternal Aunt   . Cancer Maternal Aunt     Breast and Bone   SOCIAL HISTORY:  reports that he has never smoked. He has never used smokeless tobacco. He  reports that  drinks alcohol. He reports that he does not use illicit drugs.  REVIEW OF SYSTEMS:  Unattainable, patient is sedated and intubated.  SUBJECTIVE: Prior to intubation, abdominal pain and difficulty breathing.  VITAL SIGNS: Temp:  [97.9 F (36.6 C)-105.2 F (40.7 C)] 105.2 F (40.7 C) (03/12 0908) Pulse Rate:  [98-127] 127 (03/12 0845) Resp:  [18-48] 48 (03/12 0845) BP: (92-158)/(42-78) 140/71 mmHg (03/12 0845) SpO2:  [96 %-100 %] 100 % (03/12 0845) Weight:  [122.4 kg (269 lb 13.5 oz)-124.286 kg (274 lb)] 122.4 kg (269 lb 13.5 oz) (03/12 0502) HEMODYNAMICS:   VENTILATOR SETTINGS:   INTAKE / OUTPUT: Intake/Output     03/11 0701 - 03/12 0700 03/12 0701 - 03/13 0700   P.O. 420    I.V. (mL/kg) 968.3 (7.9)    Total Intake(mL/kg) 1388.3 (11.3)    Urine (mL/kg/hr) 800    Stool 2    Total Output 802     Net +586.3           PHYSICAL EXAMINATION: General:  Chronically ill appearing male, in severe respiratory distress. Neuro:  Alert and oriented, moving all ext to command prior to intubation.  Difficulty speaking however due to respiratory distress. HEENT:  [place]/AT, PERRL, EOM-I, -JVD, -thyromegally or LAN. Cardiovascular:  Tachy but regular, Nl S1/S2, -M/R/G. Lungs:  CTA bilaterally. Abdomen:  Soft, diffusely tender, distended with hypoactive bowel sounds. Musculoskeletal:  L BKA, otherwise normal. Skin:   Bruising but otherwise intact.  LABS:  Recent Labs Lab 07/03/12 1323 07/03/12 1415 07/03/12 2014 07/04/12 0635 07/04/12 0910  HGB 11.4*  --  11.2* 11.2*  --   WBC 18.4*  --  12.2* 10.6*  --   PLT 149*  --  122* 122*  --   NA 132*  --   --  137  --   K 3.8  --   --  3.1*  --   CL 96  --   --  100  --   CO2 18*  --   --  14*  --   GLUCOSE 188*  --   --  125*  --   BUN 110*  --   --  114*  --   CREATININE 6.60*  --  6.89* 7.40*  --   CALCIUM 8.9  --   --  8.3*  --   PHOS  --   --   --  3.6  --   AST 19  --   --  42*  --   ALT 10  --   --  12  --   ALKPHOS 67  --   --  59  --   BILITOT 0.4  --   --  0.4  --   PROT 7.3  --   --  6.9  --   ALBUMIN 2.3*  --   --  2.2*  --   LATICACIDVEN  --  2.73*  --   --   --   PHART  --   --   --   --  7.381  PCO2ART  --   --   --   --  22.2*  PO2ART  --   --   --   --  113.0*    Recent Labs Lab 07/03/12 2125 07/04/12 0806  GLUCAP 205* 111*    CXR: Pending.  ASSESSMENT / PLAN:  PULMONARY A: VDRF likely due to abdominal distension and inability to keep up rate with metabolic acidosis. P:   -  Intubate. - Mechanically ventilate. - F/U ABG and CXR. - Will adjust vent accordingly. - HD to address acidosis.  CARDIOVASCULAR A: Septic shock, likely abdominal source. P:  - Sepsis protocol. - Fluid resuscitation. - No need for cardiac enzymes for now. - Hold anti-HTN, was on coreg so will need to watch for tachy without beta blockers. - Continue ASA.  RENAL A:  Acute on chronic renal failure. P:   - Patient has a fistula, will HD today and if BP drops will likely need CVVH and will need and HD catheter.  GASTROINTESTINAL A:  Intrabdominal infection likely, c. Diff negative x1 (PCR).  Diarrhea and feculent material from NGT. P:   - NGT to suction. - Surgery following. - Needs a CT of the abdomen with IV contrast, no PO contrast if ok with renal.  HEMATOLOGIC A:  Leukocytosis from infection.  H&H stable. P:  - Follow  H&H. - Stool OB.  INFECTIOUS A:  Likely abdominal source. P:   - Sepsis protocol. - Pan culture. - Cefepime/vanc/flagyl and tamiflu. - Finish course of tamiflu since already started. - Repeat C. Diff.  ENDOCRINE A:  DM.   P:   - ISS.  NEUROLOGIC A:  No active issues. P:   - Continuous sedation protocol for now.  I have personally obtained a history, examined the patient, evaluated laboratory and imaging results, formulated the assessment and plan and placed orders.  CRITICAL CARE: The patient is critically ill with multiple organ systems failure and requires high complexity decision making for assessment and support, frequent evaluation and titration of therapies, application of advanced monitoring technologies and extensive interpretation of multiple databases. Critical Care Time devoted to patient care services described in this note is 45 minutes.   Rush Farmer, M.D. Pulmonary and [place] Pager: [phone]  07/04/2012, 9:45 AM

## 2012-07-04 NOTE — Significant Event (Signed)
Rapid Response Event Note  Overview: Time Called: 0828 Event Type: Respiratory  Initial Focused Assessment: Patient alert and oriented, communicating well.  Tachypnea, distended abdomen, warm to the touch BP 151/71  ST 120s, RR 48  O2 sat 100% on 2L [place]  Rectal temp 105.2 Lung sounds clear, hypoactive bowel sounds. Distended and tender abdomen Insulin pump 22ga IV left FA with NS infusing Right arm AV graft, good trill and bruit  Interventions: PCXR, portable 2 view abdomen done Tylenol suppository given Patient had bowel movement: large liquid stool (tylenol pushed out  with stool) NGT placed immediately 500cc coffee ground gastric contents suctioned. Transferred to 2110 via bed with O2 and heart monitor.  RN at bedside, updated on patient status.  Diabetes coordinator at bedside, will DC insulin pump once insulin gtt is started.  RN notified wife of patient status and transfer.  Event Summary: Name of Physician Notified: Dr Sloan Leiter  at [phone]  Name of Consulting Physician Notified: Dr Nelda Marseille,  Dr Posey Pronto, and [place] at 0900  Outcome: Transferred (Comment)  Event End Time: [place]  Raliegh Ip

## 2012-07-04 NOTE — Progress Notes (Signed)
Patient's insulin pump from home is on patient and patient able to manage pump at this time. Patient's insertion site for insulin pump to right lower abdomen is WNL, no reddness noted. Patient stated that he changed the site on 07/01/12. Patient states that he can bring insulin pump supplies from home if needed. Will continue to monitor patient. Hassan Buckler

## 2012-07-04 NOTE — Progress Notes (Signed)
Patient tolerated dialysis treatment well.  Patient ran for 2.5 hours without adverse events or complications.  Vital signs stable.  Report given to Jonni Sanger, RN

## 2012-07-04 NOTE — Procedures (Signed)
Central Venous Catheter Insertion Procedure Note Dale [name] [id number] 03-Apr-1966  Procedure: Insertion of Central Venous Catheter Indications: Drug and/or fluid administration  Procedure Details Consent: Risks of procedure as well as the alternatives and risks of each were explained to the (patient/caregiver).  Consent for procedure obtained. Time Out: Verified patient identification, verified procedure, site/side was marked, verified correct patient position, special equipment/implants available, medications/allergies/relevent history reviewed, required imaging and test results available.  Performed  Maximum sterile technique was used including antiseptics, cap, gloves, gown, hand hygiene, mask and sheet. Skin prep: Chlorhexidine; local anesthetic administered A antimicrobial bonded/coated triple lumen catheter was placed in the left internal jugular vein using the Seldinger technique.  Evaluation Blood flow good Complications: No apparent complications Patient did tolerate procedure well. Chest X-ray ordered to verify placement.  CXR: pending.  U/S used in placement.  Randell Teare 07/04/2012, 10:32 AM

## 2012-07-04 NOTE — Progress Notes (Signed)
[facility] - PROGRESS NOTE Resident Note   Please see below for attending addendum to resident note.  Subjective:   Still feel nausea and generalized pain. C/o SOB. States sharp chest pain with breathing.  Sinus tachy on monitor and low grade temp noted.   Objective:    Vital Signs:   Temp:  [97.9 F (36.6 C)-102.9 F (39.4 C)] 100 F (37.8 C) (03/12 0502) Pulse Rate:  [98-124] 123 (03/12 0825) Resp:  [18-22] 22 (03/12 0825) BP: (92-158)/(42-78) 151/71 mmHg (03/12 0825) SpO2:  [96 %-100 %] 100 % (03/12 0825) Weight:  [269 lb 13.5 oz (122.4 kg)-274 lb (124.286 kg)] 269 lb 13.5 oz (122.4 kg) (03/12 0502) Last BM Date: 07/04/12  24-hour weight change: Weight change:   Weight trends: Filed Weights   07/03/12 1500 07/04/12 0502  Weight: 274 lb (124.286 kg) 269 lb 13.5 oz (122.4 kg)    Intake/Output:  03/11 0701 - 03/12 0700 In: 1388.3 [P.O.:420; I.V.:968.3] Out: 802 [Urine:800; Stool:2] Physical Exam:  General:  Vital signs reviewed and noted. Acute ill appearing/ obesity   Head:  Normocephalic, atraumatic.   Eyes:  PERRL, EOMI, No signs of anemia or jaundince.   Nose:  Mucous membranes dry, not inflammed, nonerythematous.   Throat:  Oropharynx nonerythematous, no exudate appreciated.   Neck:  No deformities, masses, or tenderness noted.Supple, No carotid Bruits, no JVD.   Lungs:  Normal respiratory effort. Clear to auscultation BL without crackles or wheezes.   Heart:  RRR. S1 and S2 normal without gallop, murmur, or rubs.   Abdomen:  BS normoactive. Soft, Nondistended, non-tender. No masses or organomegaly.   Extremities:  No pretibial edema. RUA AVF + thrill and bruit.   Neurologic:  A&O X3, CN II - XII are grossly intact. Motor strength is 5/5 in the all 4 extremities, Sensations intact to light touch, Cerebellar signs negative.   Skin:  No visible rashes, scars.     Labs: Basic Metabolic Panel:  Recent Labs Lab 07/03/12 1323 07/03/12 2014  07/04/12 0635  Aniruddh Ciavarella 132*  --  137  K 3.8  --  3.1*  CL 96  --  100  CO2 18*  --  14*  GLUCOSE 188*  --  125*  BUN 110*  --  114*  CREATININE 6.60* 6.89* 7.40*  CALCIUM 8.9  --  8.3*  PHOS  --   --  3.6    Liver Function Tests:  Recent Labs Lab 07/03/12 1323 07/04/12 0635  AST 19 42*  ALT 10 12  ALKPHOS 67 59  BILITOT 0.4 0.4  PROT 7.3 6.9  ALBUMIN 2.3* 2.2*   No results found for this basename: LIPASE, AMYLASE,  in the last 168 hours No results found for this basename: AMMONIA,  in the last 168 hours  CBC:  Recent Labs Lab 07/03/12 1323 07/03/12 2014 07/04/12 0635  WBC 18.4* 12.2* 10.6*  NEUTROABS 17.0*  --   --   HGB 11.4* 11.2* 11.2*  HCT 34.7* 34.1* 34.0*  MCV 97.2 95.5 97.7  PLT 149* 122* 122*    Cardiac Enzymes:  Recent Labs Lab 07/03/12 2014  CKTOTAL 154    BNP: No components found with this basename: POCBNP,   CBG:  Recent Labs Lab 07/03/12 2125 07/04/12 0806  GLUCAP 205* 111*    Microbiology: Results for orders placed during the hospital encounter of 07/03/12  CLOSTRIDIUM DIFFICILE BY PCR     Status: None   Collection Time    07/03/12  3:54  PM      Result Value Range Status   C difficile by pcr NEGATIVE  NEGATIVE Final    Coagulation Studies: No results found for this basename: LABPROT, INR,  in the last 72 hours  Urinalysis:    Component Value Date/Time   COLORURINE YELLOW 07/03/2012 1451   APPEARANCEUR TURBID* 07/03/2012 1451   LABSPEC 1.022 07/03/2012 1451   PHURINE 5.0 07/03/2012 1451   GLUCOSEU 250* 07/03/2012 1451   HGBUR MODERATE* 07/03/2012 1451   BILIRUBINUR NEGATIVE 07/03/2012 1451   KETONESUR NEGATIVE 07/03/2012 1451   PROTEINUR >300* 07/03/2012 1451   UROBILINOGEN 0.2 07/03/2012 1451   NITRITE NEGATIVE 07/03/2012 1451   LEUKOCYTESUR MODERATE* 07/03/2012 1451     Imaging: Dg Chest 2 View  07/03/2012  *RADIOLOGY REPORT*  Clinical Data: Fever and nausea  CHEST - 2 VIEW  Comparison: June 17, 2012  Findings: The  lungs are clear.  The heart size and pulmonary vascularity are within normal limits.  No adenopathy.  There is degenerative change in the thoracic spine.  IMPRESSION: No edema or consolidation.   Original Report Authenticated By: Lowella Grip, M.D.       Medications:    Infusions: . sodium chloride 100 mL/hr at 07/04/12 0649  . insulin pump      Scheduled Medications: . allopurinol  200 mg Oral Daily  . antiseptic oral rinse  15 mL Mouth Rinse BID  . aspirin  81 mg Oral Daily  . carvedilol  12.5 mg Oral BID WC  . cycloSPORINE modified  100 mg Oral BID   And  . cycloSPORINE modified  75 mg Oral BID  . heparin  5,000 Units Subcutaneous Q8H  . levothyroxine  88 mcg Oral QAC breakfast  . metoCLOPramide  5 mg Oral TID PC & HS  . metroNIDAZOLE  500 mg Oral Q8H  . oseltamivir  75 mg Oral Daily  . predniSONE  5 mg Oral Q breakfast  . sodium chloride  3 mL Intravenous Q12H  . sodium chloride  3 mL Intravenous Q12H  . vancomycin  250 mg Oral Q6H    PRN Medications: sodium chloride, acetaminophen, acetaminophen, diphenhydrAMINE, HYDROmorphone, nitroGLYCERIN, promethazine, sodium chloride   Assessment/ Plan:    Patient is a 47 y.o. male with a PMHx of s/p cadaveric kidney transplant with subsequent failure after 18 years, ESRD with HD on 07/2011, HTN, DM on insulin pump, AOC, s/p BKA who was admitted to [facility] on 07/03/2012 for evaluation of myalgias and more than 20 bowel movements of watery non bloody diarrhea x 1 day. Renal is consulted for A/C RF.   1. A/C RF, baseline Cr 5.39, BUN 98 and GFR 12  Patient presented with acute onset A/C RF with BUN 110, Cr 6.60 and GFR 9-10, which is not far from his baseline. The etiologies include volume depletion 2/2 decreased oral intake and diarrhea in the setting of diuretic Lasix and immunosuppressant medication.   - s/s uremia noted, acidosis,  - will change IVF to 1/2 NS with Bicarb.  - UA showed granular cast  - Flu PCR pending  - hold  phoslo in acute N/V/D  -  IVF at 100/h tonight ( 06/18/12 LVEF 55-60% with LVH)    2. Diarrhea--AGE  - C-Diff negative - on vanc. rece D/C--defer to primary care team    3. Non Gap metabolic acidosis  - 2/2 GI loss of HCO3  - IVF   4. Hyponatremia--associated with RF  5. SIRS and lactic acidosis--per primary care  team  - ? infectious  - Blood culture pending   6. Anemia of chronic disease--HH stable on outpatient EPO  7. Pyuria  - Urine culture pending  - already started on ABX for possible sepsis   8. UA moderate HGB with only RBC 3-6  - recs checking CK--defer to primary team ( I called admitting MD)   DVT PPX - recs Heparin--defer to primary team   Patient renal function worsening last night with s/s uremia and acidosis. recs repeat lactic acid level--acidosis 2/2 A/C RF vs sepsis. Patient also has respiratory distress. rec ABG.  Consider HD after discussing with Dr. Posey Pronto.      Length of Stay: 1 days  Patient history and plan of care reviewed with attending, Dr. Posey Pronto.   Charlann Lange, MD  PGYII, Internal Medicine Resident 07/04/2012, 8:43 AM

## 2012-07-04 NOTE — Progress Notes (Signed)
[facility] PROGRESS NOTE  Dale [name] [id number] DOB: 1965-08-24 DOA: 07/03/2012 PCP: Dale Kalata, MD  History of renal transplant on immunosuppressants, diabetes mellitus type 1 on insulin pump, hypothyroidism, recently discharged from the hospital for chest pain on 06/19/2012 that comes in for myalgias and more than 20 bowel movements of watery stool with no blood in the last 24 hours.He relates he'Dale had a temperature at home 100.3. He relates some dizziness upon standing. He also relates body aches, nothing makes these better or worst. He denies any abdominal pain.   Subjective: 07/04/12:  Patient found to be short of breath, tachycardic, diaphoretic.  Complaining of abdominal pain.  CBG WNL.  Thought to be uremic / septic.  Rapid response called.  Stat abdominal xray ordered with stat lactic acid and ABG.   Assessment/Plan: Abdominal Distention - patient is having periumbilical pain, is distended and has no bowel sounds.  Stat abd xray in process.  Pending results -Likely will need NGT and CCS consult -?SBO -?Colits/enteritis  Severe Sepsis -suspect abdominal source -Temp 105.2 rectal. -c-diff negative -Blood cultures drawn at admission, lactic acid, ABG pending -will start Empiric Vanco/Cefepime and IV Flagyl till further culture date available. Stop oral flagyl and vancomycin as C Diff negative  Acute on CKD Stage 4/History of renal transplant  Creatinine 7.4, baseline is 5.0.   Patient with graft in left arm likely needs emergent dialysis. Dr. Posey [name] informed  Metabolic Acidosis -likely 2/2 ARF -?intra-abdominal process-may need a CT Abdomen at some point -re-check lactate  Tachypnea/SOB -repeat CXR -?from severe sepsis and met acidosis -moving to ICU  Obstructive sleep apnea:  - CPAP her home settings.  -Refused last night.  Diabetes mellitus:  -DM Coordinator to take him off Pump.  Start appropriate insulin. -NPO   Code Status: full Family  Communication:  Disposition Plan: inpatient.   Consultants:  renal  Procedures:    Antibiotics:  Vanc, cefepime, flagyl  HPI/Subjective: Complaining of abd pain and sob.  Objective: Filed Vitals:   07/04/12 0502 07/04/12 0825 07/04/12 0845 07/04/12 0908  BP: 158/74 151/71 140/71   Pulse: 122 123 127   Temp: 100 F (37.8 C)   105.2 F (40.7 C)  TempSrc: Oral Oral  Rectal  Resp: 22 22 48   Height:      Weight: 122.4 kg (269 lb 13.5 oz)     SpO2: 99% 100% 100%     Intake/Output Summary (Last 24 hours) at 07/04/12 0913 Last data filed at 07/04/12 [id number]  Gross per 24 hour  Intake 1388.34 ml  Output    802 ml  Net 586.34 ml   Filed Weights   07/03/12 1500 07/04/12 0502  Weight: 124.286 kg (274 lb) 122.4 kg (269 lb 13.5 oz)    Exam:   General:  Diaphoretic, tachy, difficult to speak.  Cardiovascular: pulse 123, no m/r/g  Respiratory: resp 24, CTA, Mild accessory muscle movement  Abdomen: Slightly distended, tender in epigastrum and RLQ.  No bowel sounds heard.  Musculoskeletal: left lower extremity AKA, Signs of old healing wound in stocking pattern on RLE.  Occasional uncontrolled jerking motion in his extremities.  Data Reviewed: Basic Metabolic Panel:  Recent Labs Lab 07/03/12 1323 07/03/12 2014 07/04/12 0635  NA 132*  --  137  K 3.8  --  3.1*  CL 96  --  100  CO2 18*  --  14*  GLUCOSE 188*  --  125*  BUN 110*  --  114*  CREATININE 6.60* 6.89* 7.40*  CALCIUM 8.9  --  8.3*  PHOS  --   --  3.6   Liver Function Tests:  Recent Labs Lab 07/03/12 1323 07/04/12 0635  AST 19 42*  ALT 10 12  ALKPHOS 67 59  BILITOT 0.4 0.4  PROT 7.3 6.9  ALBUMIN 2.3* 2.2*   CBC:  Recent Labs Lab 07/03/12 1323 07/03/12 2014 07/04/12 0635  WBC 18.4* 12.2* 10.6*  NEUTROABS 17.0*  --   --   HGB 11.4* 11.2* 11.2*  HCT 34.7* 34.1* 34.0*  MCV 97.2 95.5 97.7  PLT 149* 122* 122*   Cardiac Enzymes:  Recent Labs Lab 07/03/12 2014  CKTOTAL 154    CBG:  Recent Labs Lab 07/03/12 2125 07/04/12 0806  GLUCAP 205* 111*    Recent Results (from the past 240 hour(Dale))  CLOSTRIDIUM DIFFICILE BY PCR     Status: None   Collection Time    07/03/12  3:54 PM      Result Value Range Status   C difficile by pcr NEGATIVE  NEGATIVE Final     Studies: Dg Chest 2 View  07/03/2012  *RADIOLOGY REPORT*  Clinical Data: Fever and nausea  CHEST - 2 VIEW  Comparison: June 17, 2012  Findings: The lungs are clear.  The heart size and pulmonary vascularity are within normal limits.  No adenopathy.  There is degenerative change in the thoracic spine.  IMPRESSION: No edema or consolidation.   Original Report Authenticated By: Lowella Grip, M.D.     Scheduled Meds: . allopurinol  200 mg Oral Daily  . antiseptic oral rinse  15 mL Mouth Rinse BID  . aspirin  81 mg Oral Daily  . carvedilol  12.5 mg Oral BID WC  . ceFEPime (MAXIPIME) IV  1 g Intravenous Q24H  . cycloSPORINE modified  100 mg Oral BID   And  . cycloSPORINE modified  75 mg Oral BID  . heparin  5,000 Units Subcutaneous Q8H  . levothyroxine  88 mcg Oral QAC breakfast  . metoCLOPramide  5 mg Oral TID PC & HS  . metronidazole  500 mg Intravenous Q8H  . oseltamivir  75 mg Oral Daily  . predniSONE  5 mg Oral Q breakfast  . sodium chloride  3 mL Intravenous Q12H  . sodium chloride  3 mL Intravenous Q12H   Continuous Infusions: . sodium chloride 100 mL/hr at 07/04/12 0649  . insulin pump      Principal Problem:   Fever and chills Active Problems:   History of renal transplant   Obstructive sleep apnea   Diabetes mellitus   Diarrhea   Nausea   Leukocytosis   AKI (acute kidney injury)    Dale [name]  [facility] Pager [phone]. If 7PM-7AM, please contact night-coverage at [email or web address], password [facility] 07/04/2012, 9:13 AM  LOS: 1 day    Attending -patient seen and examined, agree with the above assessment and plan. Above documentation was reviewed,  necessary changes were made. Likely with severe sepsis-from intra-abdominal source-with worsening renal failure and metabolic acidosis. Now in the ICU-intubated and getting emergent HD.  Dale [name]

## 2012-07-04 NOTE — Consult Note (Signed)
Dale [name] 1966/03/21  [id number].   Requesting MD: Dr. Oren Binet Chief Complaint/Reason for Consult: abdominal distention and pain HPI: this is a 47 yo male with multiple medical problems including h/o renal transplant, now with worsening ARF, who presented to [facility] yesterday with nausea, vomiting, diarrhea.  He was checked for c. Diff colitis which was negative.  He began to c/o abdominal pain and distention over night and today.  He is now tachycardic and tachypnic with a fever of 105.2 rectally.  He just had a portal AXR, but due to body habitus was unable to tell much except he had a large air filled stomach.  An NGT was placed with about 600cc of immediate slightly blood-tinged bilious output.  We have been asked to see the patient for further evaluation.  Review of Systems: please see HPI, otherwise a full review was unable to be done secondary to the patient's medical state at this time.  Family History  Problem Relation Age of Onset  . Malignant hyperthermia Neg Hx   . Colon polyps Father   . Diabetes Father   . Cancer Father   . Breast cancer Maternal Aunt   . Cancer Maternal Aunt     Breast and Bone    Past Medical History  Diagnosis Date  . Insulin dependent diabetes mellitus   . Hypertension   . History of blood transfusion   . ESRD (end stage renal disease)     a. 1995 s/p cadaveric transplant w/ susbequent failure after 18 yrs;  b.Dialysis initiated 07/2011  . Dyspnea     a. 07/23/11 Echo: EF 55-60%  . Anemia of chronic disease   . Cholecystitis     a. 08/27/2011  . Constipation   . Pericardial effusion     a.  Small by CT 08/22/11;  b.  Large by CT 08/27/11  . Inguinal lymphadenopathy     a. bilateral - s/p biopsy 07/2011    Past Surgical History  Procedure Laterality Date  . Appendectomy    . Hernia repair    . Kidney transplant    . Cataract extraction    . Arteriovenous graft placement    . Av fistula placement    . Toe amputation    . Below knee leg  amputation    . Carpal tunnel release    . Shoulder surgery    . Av fistula placement  07/29/2011    Procedure: ARTERIOVENOUS (AV) FISTULA CREATION;  Surgeon: Mal Misty, MD;  Location: [place];  Service: Vascular;  Laterality: Right;  Brachial cephalic  . Insertion of dialysis catheter  08/01/2011    Procedure: INSERTION OF DIALYSIS CATHETER;  Surgeon: Rosetta Posner, MD;  Location: [place];  Service: Vascular;  Laterality: Right;  insertion of dialysis catheter on right internal jugular vein  . Lymph node biopsy  08/10/2011    Procedure: LYMPH NODE BIOPSY;  Surgeon: Merrie Roof, MD;  Location: [place];  Service: General;  Laterality: Left;  left groin lymph biopsy  . Colonoscopy  08/29/2011    Procedure: COLONOSCOPY;  Surgeon: Jerene Bears, MD;  Location: [place];  Service: Gastroenterology;  Laterality: N/A;    Social History:  reports that he has never smoked. He has never used smokeless tobacco. He reports that  drinks alcohol. He reports that he does not use illicit drugs.  Allergies:  Allergies  Allergen Reactions  . Morphine And Related Hives and Nausea Only  . Amoxicillin Other (See Comments)  Reaction unknown  . Ciprofloxacin Other (See Comments)    Reaction unknown  . Clindamycin/Lincomycin Other (See Comments)    Reaction unknown  . Codeine Other (See Comments)    Reaction unknown. But pt has tolerated hydrocodone in Vicodin and Norco in the past  . Levofloxacin Other (See Comments)    Reaction unknown  . Vasotec Cough  . Oxycodone Rash    Medications Prior to Admission  Medication Sig Dispense Refill  . allopurinol (ZYLOPRIM) 100 MG tablet Take 200 mg by mouth daily. 2 Tabs      . amLODipine (NORVASC) 5 MG tablet Take 1 tablet (5 mg total) by mouth daily.  30 tablet  0  . aspirin 81 MG tablet Take 81 mg by mouth every evening.       . calcium acetate (PHOSLO) 667 MG capsule Take 1,334-2,001 mg by mouth 5 (five) times daily. 2001 mg with meals and 1334 mg with snacks       . carvedilol (COREG) 6.25 MG tablet Take 12.5 mg by mouth 2 (two) times daily with a meal.      . cycloSPORINE modified (NEORAL) 25 MG capsule Take 75 mg by mouth 2 (two) times daily. Takes with 100 mg capsules for total 175 mg BID      . cycloSPORINE modified (NEORAL/GENGRAF) 100 MG capsule Take 100 mg by mouth 2 (two) times daily. Neoral brand      . diphenhydrAMINE (BENADRYL) 25 mg capsule Take 50 mg by mouth at bedtime as needed for itching or sleep.      [name] Kitchen docusate sodium (COLACE) 100 MG capsule Take 200 mg by mouth 3 (three) times daily.      . furosemide (LASIX) 80 MG tablet 160 mg 3 (three) times daily.       [name] Kitchen GLUCAGON EMERGENCY 1 MG injection Inject 1 mg into the muscle once as needed.       [name] Kitchen HYDROmorphone (DILAUDID) 2 MG tablet Take 2 mg by mouth 4 (four) times daily as needed. For pain      . Insulin Human (INSULIN PUMP) 100 unit/ml SOLN Inject into the skin continuous. Humalog      . levothyroxine (SYNTHROID, LEVOTHROID) 88 MCG tablet Take 88 mcg by mouth daily.      [name] Kitchen loratadine (CLARITIN) 10 MG tablet Take 10 mg by mouth daily.      . metoCLOPramide (REGLAN) 5 MG tablet Take 5 mg by mouth 4 (four) times daily - after meals and at bedtime.      . nitroGLYCERIN (NITROSTAT) 0.4 MG SL tablet Place 0.4 mg under the tongue every 5 (five) minutes as needed for chest pain.       . predniSONE (DELTASONE) 5 MG tablet Take 5 mg by mouth Daily.      . promethazine (PHENERGAN) 25 MG tablet Take 25 mg by mouth Every 6 hours as needed. For nausea      . PROVENTIL HFA 108 (90 BASE) MCG/ACT inhaler Inhale 1 puff into the lungs Every 6 hours as needed. For shortness of breath      . ranitidine (ZANTAC) 150 MG tablet Take 1 tablet (150 mg total) by mouth 2 (two) times daily.  60 tablet  3  . rOPINIRole (REQUIP) 1 MG tablet Take 1 mg by mouth Every evening. Take at 2000      . epoetin alfa (EPOGEN,PROCRIT) 91478 UNIT/ML injection Inject 40,000 Units into the skin every 21 ( twenty-one) days. If  hemoglobin is below 12  Blood pressure 140/71, pulse 127, temperature 105.2 F (40.7 C), temperature source Rectal, resp. rate 48, height 5\' 8"  (1.727 m), weight 269 lb 13.5 oz (122.4 kg), SpO2 100.00%. Physical Exam: General: pleasant, morbidly obese white male who is laying in bed in moderate distress secondary to abdominal pain and distention HEENT: head is normocephalic, atraumatic.  Sclera are noninjected.  PERRL.  Ears and nose without any masses or lesions.  Mouth is pink and moist.  Patient had incredibly poor dentition.  NGT placed in right naris with 600cc of blood-tinged bilious output Heart: regular rhythm, but tachycardic.  Normal s1,s2. No obvious murmurs, gallops, or rubs noted.  Palpable radial and right pedal pulses. Lungs: CTAB, but shallow breathing and tachypnea present secondary to abdominal distention.  O2 in place Abd: soft, but distended, hypoactive BS, diffusely tender, but no definite peritoneal signs at this point.  No guarding.  Multiple scars from prior surgery.  Transplant kidney in RLQ MS: BKA of left leg.  + edema of RLE with chronic venous stasis changes.  His great toe and 5th have been amputated from the right foot. Psych: A&Ox3 with an appropriate affect, but in distress secondary to medical illness    Results for orders placed during the hospital encounter of 07/03/12 (from the past 48 hour(s))  CBC WITH DIFFERENTIAL     Status: Abnormal   Collection Time    07/03/12  1:23 PM      Result Value Range   WBC 18.4 (*) 4.0 - 10.5 K/uL   RBC 3.57 (*) 4.22 - 5.81 MIL/uL   Hemoglobin 11.4 (*) 13.0 - 17.0 g/dL   HCT 34.7 (*) 39.0 - 52.0 %   MCV 97.2  78.0 - 100.0 fL   MCH 31.9  26.0 - 34.0 pg   MCHC 32.9  30.0 - 36.0 g/dL   RDW 15.7 (*) 11.5 - 15.5 %   Platelets 149 (*) 150 - 400 K/uL   Neutrophils Relative 92 (*) 43 - 77 %   Neutro Abs 17.0 (*) 1.7 - 7.7 K/uL   Lymphocytes Relative 5 (*) 12 - 46 %   Lymphs Abs 0.9  0.7 - 4.0 K/uL   Monocytes  Relative 3  3 - 12 %   Monocytes Absolute 0.5  0.1 - 1.0 K/uL   Eosinophils Relative 0  0 - 5 %   Eosinophils Absolute 0.0  0.0 - 0.7 K/uL   Basophils Relative 0  0 - 1 %   Basophils Absolute 0.0  0.0 - 0.1 K/uL  COMPREHENSIVE METABOLIC PANEL     Status: Abnormal   Collection Time    07/03/12  1:23 PM      Result Value Range   Sodium 132 (*) 135 - 145 mEq/L   Potassium 3.8  3.5 - 5.1 mEq/L   Chloride 96  96 - 112 mEq/L   CO2 18 (*) 19 - 32 mEq/L   Glucose, Bld 188 (*) 70 - 99 mg/dL   BUN 110 (*) 6 - 23 mg/dL   Creatinine, Ser 6.60 (*) 0.50 - 1.35 mg/dL   Calcium 8.9  8.4 - 10.5 mg/dL   Total Protein 7.3  6.0 - 8.3 g/dL   Albumin 2.3 (*) 3.5 - 5.2 g/dL   AST 19  0 - 37 U/L   ALT 10  0 - 53 U/L   Alkaline Phosphatase 67  39 - 117 U/L   Total Bilirubin 0.4  0.3 - 1.2 mg/dL   GFR calc non Af  Amer 9 (*) >90 mL/min   GFR calc Af Amer 10 (*) >90 mL/min   Comment:            The eGFR has been calculated     using the CKD EPI equation.     This calculation has not been     validated in all clinical     situations.     eGFR's persistently     <90 mL/min signify     possible Chronic Kidney Disease.  POCT I-STAT TROPONIN I     Status: None   Collection Time    07/03/12  2:12 PM      Result Value Range   Troponin i, poc 0.05  0.00 - 0.08 ng/mL   Comment 3            Comment: Due to the release kinetics of cTnI,     a negative result within the first hours     of the onset of symptoms does not rule out     myocardial infarction with certainty.     If myocardial infarction is still suspected,     repeat the test at appropriate intervals.  CG4 I-STAT (LACTIC ACID)     Status: Abnormal   Collection Time    07/03/12  2:15 PM      Result Value Range   Lactic Acid, Venous 2.73 (*) 0.5 - 2.2 mmol/L  URINALYSIS, ROUTINE W REFLEX MICROSCOPIC     Status: Abnormal   Collection Time    07/03/12  2:51 PM      Result Value Range   Color, Urine YELLOW  YELLOW   APPearance TURBID (*) CLEAR    Specific Gravity, Urine 1.022  1.005 - 1.030   pH 5.0  5.0 - 8.0   Glucose, UA 250 (*) NEGATIVE mg/dL   Hgb urine dipstick MODERATE (*) NEGATIVE   Bilirubin Urine NEGATIVE  NEGATIVE   Ketones, ur NEGATIVE  NEGATIVE mg/dL   Protein, ur >300 (*) NEGATIVE mg/dL   Urobilinogen, UA 0.2  0.0 - 1.0 mg/dL   Nitrite NEGATIVE  NEGATIVE   Leukocytes, UA MODERATE (*) NEGATIVE  URINE MICROSCOPIC-ADD ON     Status: Abnormal   Collection Time    07/03/12  2:51 PM      Result Value Range   Squamous Epithelial / LPF MANY (*) RARE   WBC, UA 3-6  <3 WBC/hpf   RBC / HPF 3-6  <3 RBC/hpf   Bacteria, UA FEW (*) RARE   Casts GRANULAR CAST (*) NEGATIVE   Urine-Other AMORPHOUS URATES/PHOSPHATES    CLOSTRIDIUM DIFFICILE BY PCR     Status: None   Collection Time    07/03/12  3:54 PM      Result Value Range   C difficile by pcr NEGATIVE  NEGATIVE  CK     Status: None   Collection Time    07/03/12  8:14 PM      Result Value Range   Total CK 154  7 - 232 U/L  CBC     Status: Abnormal   Collection Time    07/03/12  8:14 PM      Result Value Range   WBC 12.2 (*) 4.0 - 10.5 K/uL   RBC 3.57 (*) 4.22 - 5.81 MIL/uL   Hemoglobin 11.2 (*) 13.0 - 17.0 g/dL   HCT 34.1 (*) 39.0 - 52.0 %   MCV 95.5  78.0 - 100.0 fL   MCH 31.4  26.0 - 34.0  pg   MCHC 32.8  30.0 - 36.0 g/dL   RDW 15.9 (*) 11.5 - 15.5 %   Platelets 122 (*) 150 - 400 K/uL  CREATININE, SERUM     Status: Abnormal   Collection Time    07/03/12  8:14 PM      Result Value Range   Creatinine, Ser 6.89 (*) 0.50 - 1.35 mg/dL   GFR calc non Af Amer 9 (*) >90 mL/min   GFR calc Af Amer 10 (*) >90 mL/min   Comment:            The eGFR has been calculated     using the CKD EPI equation.     This calculation has not been     validated in all clinical     situations.     eGFR's persistently     <90 mL/min signify     possible Chronic Kidney Disease.  TSH     Status: None   Collection Time    07/03/12  8:14 PM      Result Value Range   TSH 3.013   0.350 - 4.500 uIU/mL  GLUCOSE, CAPILLARY     Status: Abnormal   Collection Time    07/03/12  9:25 PM      Result Value Range   Glucose-Capillary 205 (*) 70 - 99 mg/dL   Comment 1 Documented in Chart     Comment 2 Notify RN    INFLUENZA PANEL BY PCR     Status: None   Collection Time    07/03/12 10:12 PM      Result Value Range   Influenza A By PCR NEGATIVE  NEGATIVE   Influenza B By PCR NEGATIVE  NEGATIVE   H1N1 flu by pcr NOT DETECTED  NOT DETECTED   Comment:            The Xpert Flu assay (FDA approved for     nasal aspirates or washes and     nasopharyngeal swab specimens), is     intended as an aid in the diagnosis of     influenza and should not be used as     a sole basis for treatment.  COMPREHENSIVE METABOLIC PANEL     Status: Abnormal   Collection Time    07/04/12  6:35 AM      Result Value Range   Sodium 137  135 - 145 mEq/L   Potassium 3.1 (*) 3.5 - 5.1 mEq/L   Comment: DELTA CHECK NOTED   Chloride 100  96 - 112 mEq/L   CO2 14 (*) 19 - 32 mEq/L   Glucose, Bld 125 (*) 70 - 99 mg/dL   BUN 114 (*) 6 - 23 mg/dL   Creatinine, Ser 7.40 (*) 0.50 - 1.35 mg/dL   Calcium 8.3 (*) 8.4 - 10.5 mg/dL   Total Protein 6.9  6.0 - 8.3 g/dL   Albumin 2.2 (*) 3.5 - 5.2 g/dL   AST 42 (*) 0 - 37 U/L   ALT 12  0 - 53 U/L   Alkaline Phosphatase 59  39 - 117 U/L   Total Bilirubin 0.4  0.3 - 1.2 mg/dL   GFR calc non Af Amer 8 (*) >90 mL/min   GFR calc Af Amer 9 (*) >90 mL/min   Comment:            The eGFR has been calculated     using the CKD EPI equation.     This calculation has not  been     validated in all clinical     situations.     eGFR's persistently     <90 mL/min signify     possible Chronic Kidney Disease.  CBC     Status: Abnormal   Collection Time    07/04/12  6:35 AM      Result Value Range   WBC 10.6 (*) 4.0 - 10.5 K/uL   RBC 3.48 (*) 4.22 - 5.81 MIL/uL   Hemoglobin 11.2 (*) 13.0 - 17.0 g/dL   HCT 34.0 (*) 39.0 - 52.0 %   MCV 97.7  78.0 - 100.0 fL   MCH  32.2  26.0 - 34.0 pg   MCHC 32.9  30.0 - 36.0 g/dL   RDW 16.0 (*) 11.5 - 15.5 %   Platelets 122 (*) 150 - 400 K/uL  PHOSPHORUS     Status: None   Collection Time    07/04/12  6:35 AM      Result Value Range   Phosphorus 3.6  2.3 - 4.6 mg/dL  GLUCOSE, CAPILLARY     Status: Abnormal   Collection Time    07/04/12  8:06 AM      Result Value Range   Glucose-Capillary 111 (*) 70 - 99 mg/dL  BLOOD GAS, ARTERIAL     Status: Abnormal   Collection Time    07/04/12  9:10 AM      Result Value Range   O2 Content 2.0     Delivery systems NASAL CANNULA     pH, Arterial 7.381  7.350 - 7.450   pCO2 arterial 22.2 (*) 35.0 - 45.0 mmHg   pO2, Arterial 113.0 (*) 80.0 - 100.0 mmHg   Bicarbonate 12.3 (*) 20.0 - 24.0 mEq/L   TCO2 12.9  0 - 100 mmol/L   Acid-base deficit 11.2 (*) 0.0 - 2.0 mmol/L   O2 Saturation 96.6     Patient temperature 105.0     Collection site LEFT RADIAL     Drawn by [id number]     Sample type ARTERIAL DRAW     Allens test (pass/fail) PASS  PASS   Dg Chest 2 View  07/03/2012  *RADIOLOGY REPORT*  Clinical Data: Fever and nausea  CHEST - 2 VIEW  Comparison: June 17, 2012  Findings: The lungs are clear.  The heart size and pulmonary vascularity are within normal limits.  No adenopathy.  There is degenerative change in the thoracic spine.  IMPRESSION: No edema or consolidation.   Original Report Authenticated By: Lowella Grip, M.D.    Dg Chest Port 1 View  07/04/2012  *RADIOLOGY REPORT*  Clinical Data: Short of breath  PORTABLE CHEST - 1 VIEW  Comparison: Chest radiograph 07/03/2012  Findings: Stable mildly enlarged heart silhouette.  No effusion, infiltrate, or pneumothorax.  Lung volumes.  IMPRESSION: Borderline enlarged heart silhouette.  No acute findings.   Original Report Authenticated By: Suzy Bouchard, M.D.    Dg Abd Portable 2v  07/04/2012  *RADIOLOGY REPORT*  Clinical Data: Abdominal distention and pain.  PORTABLE ABDOMEN - 2 VIEW  Comparison: None.  Findings: Image  quality is degraded by body habitus.  Stomach is air-filled and markedly distended.  There appears to be small bowel distention as well with associated air fluid levels.  The lower abdomen is not included on the images. Numerous high density structures overlying the right abdomen on the left lateral decubitus view are likely artifactual, given their apparent absence on the supine view.  IMPRESSION: Image quality is degraded  by body habitus.  Findings are suspicious for a high-grade small bowel obstruction.   Original Report Authenticated By: Lorin Picket, M.D.        Assessment/Plan 1. Abdominal distention, etiology unclear at this time 2. Type I Diabetes 3. N/V/D 4. Febrile illness of 105.2 Patient Active Problem List  Diagnosis  . CKD (chronic kidney disease)  . History of renal transplant  . S/P BKA (below knee amputation)  . Obstructive sleep apnea  . Dyspnea  . CHF (congestive heart failure)  . Anemia  . Diabetes mellitus  . Constipation, acute  . ESRD (end stage renal disease)  . Abdominal pain  . Pericardial effusion  . Colitis, ischemic  . End stage renal disease  . Swelling of arm  . Chest pain  . Fever and chills  . Diarrhea  . Nausea  . Leukocytosis  . AKI (acute kidney injury)   Plan: 1. It is uncommon for someone to have a fever of 105.2 related to an intra-abdominal source.  It is unclear whether he may have some other type of febrile illness that is causing an ileus which is the cause of his abdominal distention.  It is unclear right now and his PAXRs are not helpful secondary to his body habitus.  It would be nice to get a CT of the abdomen/pelvis with contrast; however, due to his renal function we will likely not be able to give IV contrast unless he is going to be getting dialyzed today.  I will d/w renal.  He is a transplant patient and is on immunosuppressants, so he is much more susceptible than most other people.  We will follow closely with  you.  Eliah Marquard E 07/04/2012, 9:35 AM Pager: [phone]

## 2012-07-04 NOTE — Progress Notes (Signed)
Patient transported to and from CT on vent by Glorianne Manchester RRT.

## 2012-07-04 NOTE — Progress Notes (Signed)
INITIAL NUTRITION ASSESSMENT  DOCUMENTATION CODES Per approved criteria  -Morbid Obesity   INTERVENTION:  When GI function stabilizes and ready for enteral nutrition, initiate TF via enteral feeding tube with Vital AF 1.2 at 10 ml/h, increase by 10 ml every 4 hours to goal rate of 40 ml/h with Prostat 60 ml TID to provide 1752 kcals (70% of estimated needs), 162 gm protein (98% of estimated needs), 779 ml free water daily.  NUTRITION DIAGNOSIS: Inadequate oral intake related to inability to eat as evidenced by NPO status.   Goal: Enteral nutrition to provide 60-70% of estimated calorie needs (22-25 kcals/kg ideal body weight) and 100% of estimated protein needs, based on ASPEN guidelines for permissive underfeeding in critically ill obese individuals.  Monitor:  TF tolerance/adequacy, weight trend, labs, vent status.  Reason for Assessment: MD Consult for TF initiation and management.  47 y.o. male  Admitting Dx: Fever and chills  ASSESSMENT: Patient was admitted 3/11 with abdominal distention and severe diarrhea.  Developed respiratory failure and septic shock and required intubation earlier today.  Patient with history of ESRD, S/P renal transplant in 1995.  Now with acute kidney injury and plans to start HD today.  Patient also with diarrhea, first C. Diff study was negative; being rechecked.  Received consult for TF initiation, however, do not feel that patient is ready for enteral nutrition at this time due to increased abdominal distension and feculent NGT aspirate.  NG tube is to suction.  Patient is currently intubated on ventilator support.  MV: 11.3 Temp:Temp (24hrs), Avg:101.1 F (38.4 C), Min:97.9 F (36.6 C), Max:105.2 F (40.7 C)   Height: Ht Readings from Last 1 Encounters:  07/03/12 5\' 8"  (1.727 m)    Weight: Wt Readings from Last 1 Encounters:  07/04/12 269 lb 13.5 oz (122.4 kg)    Ideal Body Weight: 66 kg (adjusted for AKA)  % Ideal Body Weight:  185%  Wt Readings from Last 10 Encounters:  07/04/12 269 lb 13.5 oz (122.4 kg)  06/18/12 269 lb 9.6 oz (122.29 kg)  03/19/12 262 lb (118.842 kg)  09/20/11 253 lb (114.76 kg)  09/06/11 248 lb 10.9 oz (112.8 kg)  09/06/11 248 lb 10.9 oz (112.8 kg)  09/06/11 248 lb 10.9 oz (112.8 kg)  08/26/11 260 lb 6.4 oz (118.117 kg)  08/13/11 253 lb 15.5 oz (115.2 kg)  08/13/11 253 lb 15.5 oz (115.2 kg)    Usual Body Weight: 262 lb  % Usual Body Weight: 103%  BMI:  43.6 (extreme obesity, class 3)  Estimated Nutritional Needs: Kcal: 2500 Protein: 165 gm Fluid: per primary team  Skin: intact; bruising  Diet Order: NPO  EDUCATION NEEDS: -Education not appropriate at this time   Intake/Output Summary (Last 24 hours) at 07/04/12 1150 Last data filed at 07/04/12 0935  Gross per 24 hour  Intake 1388.34 ml  Output   1302 ml  Net  86.34 ml    Last BM: 3/12   Labs:   Recent Labs Lab 07/03/12 1323 07/03/12 2014 07/04/12 0635  NA 132*  --  137  K 3.8  --  3.1*  CL 96  --  100  CO2 18*  --  14*  BUN 110*  --  114*  CREATININE 6.60* 6.89* 7.40*  CALCIUM 8.9  --  8.3*  PHOS  --   --  3.6  GLUCOSE 188*  --  125*    CBG (last 3)   Recent Labs  07/03/12 2125 07/04/12 0806  GLUCAP  205* 111*    Scheduled Meds: . aspirin  81 mg Oral Daily  . ceFEPime (MAXIPIME) IV  1 g Intravenous Q24H  . cycloSPORINE modified  100 mg Oral BID   And  . cycloSPORINE modified  75 mg Oral BID  . fentaNYL      . heparin  5,000 Units Subcutaneous Q8H  . insulin regular  0-10 Units Intravenous TID WC  . levothyroxine  88 mcg Oral QAC breakfast  . metoCLOPramide  5 mg Oral TID PC & HS  . metronidazole  500 mg Intravenous Q8H  . midazolam      . oseltamivir  75 mg Oral Daily  . predniSONE  5 mg Oral Q breakfast  . sodium chloride  3 mL Intravenous Q12H  . sodium chloride  3 mL Intravenous Q12H    Continuous Infusions: . sodium chloride 100 mL/hr at 07/04/12 0649  . sodium chloride     . dextrose 5 % and 0.45% NaCl    . fentaNYL infusion INTRAVENOUS 50 mcg/hr (07/04/12 1149)  . insulin pump    . insulin (NOVOLIN-R) infusion    . midazolam (VERSED) infusion 2 mg/hr (07/04/12 1148)    Past Medical History  Diagnosis Date  . Insulin dependent diabetes mellitus   . Hypertension   . History of blood transfusion   . ESRD (end stage renal disease)     a. 1995 s/p cadaveric transplant w/ susbequent failure after 18 yrs;  b.Dialysis initiated 07/2011  . Dyspnea     a. 07/23/11 Echo: EF 55-60%  . Anemia of chronic disease   . Cholecystitis     a. 08/27/2011  . Constipation   . Pericardial effusion     a.  Small by CT 08/22/11;  b.  Large by CT 08/27/11  . Inguinal lymphadenopathy     a. bilateral - s/p biopsy 07/2011    Past Surgical History  Procedure Laterality Date  . Appendectomy    . Hernia repair    . Kidney transplant    . Cataract extraction    . Arteriovenous graft placement    . Av fistula placement    . Toe amputation    . Below knee leg amputation    . Carpal tunnel release    . Shoulder surgery    . Av fistula placement  07/29/2011    Procedure: ARTERIOVENOUS (AV) FISTULA CREATION;  Surgeon: Mal Misty, MD;  Location: [place];  Service: Vascular;  Laterality: Right;  Brachial cephalic  . Insertion of dialysis catheter  08/01/2011    Procedure: INSERTION OF DIALYSIS CATHETER;  Surgeon: Rosetta Posner, MD;  Location: [place];  Service: Vascular;  Laterality: Right;  insertion of dialysis catheter on right internal jugular vein  . Lymph node biopsy  08/10/2011    Procedure: LYMPH NODE BIOPSY;  Surgeon: Merrie Roof, MD;  Location: [facility];  Service: General;  Laterality: Left;  left groin lymph biopsy  . Colonoscopy  08/29/2011    Procedure: COLONOSCOPY;  Surgeon: Jerene Bears, MD;  Location: [facility];  Service: Gastroenterology;  Laterality: N/A;     Molli Barrows, RD, LDN, CNSC Pager# [phone] After Hours Pager# [phone]

## 2012-07-04 NOTE — Procedures (Signed)
Intubation Procedure Note Dale [name] [id number] 18-Oct-1965  Procedure: Intubation Indications: Respiratory insufficiency  Procedure Details Consent: Risks of procedure as well as the alternatives and risks of each were explained to the (patient/caregiver).  Consent for procedure obtained. Time Out: Verified patient identification, verified procedure, site/side was marked, verified correct patient position, special equipment/implants available, medications/allergies/relevent history reviewed, required imaging and test results available.  Performed  Maximum sterile technique was used including gloves, gown and mask.  MAC    Evaluation Hemodynamic Status: BP stable throughout; O2 sats: stable throughout Patient's Current Condition: stable Complications: No apparent complications Patient did tolerate procedure well. Chest X-ray ordered to verify placement.  CXR: pending.   [name] 07/04/2012

## 2012-07-04 NOTE — Progress Notes (Addendum)
Inpatient Diabetes Program Recommendations  AACE/ADA: New Consensus Statement on Inpatient Glycemic Control (2013)  Target Ranges:  Prepandial:   less than 140 mg/dL      Peak postprandial:   less than 180 mg/dL (1-2 hours)      Critically ill patients:  140 - 180 mg/dL   Inpatient Diabetes Program Recommendations Insulin - IV drip/GlucoStabilizer: start insulin gtt secondary to insulin pump being discontinued Thank you  Raoul Pitch BSN, RN,CDE Inpatient Diabetes Coordinator [phone] (team pager)  Addendum: patient is in room 2110. Coordinator spoke with patient's RN about continuing insulin pump until insulin gtt is started.  Recommended CBG checks every hour until insulin gtt is started and pump discontinued.

## 2012-07-04 NOTE — Progress Notes (Signed)
10:20 Pt intubated by CCM NP, 7.5 ETT 24 Lip, ETCO2 good color change, BBS equal,

## 2012-07-04 NOTE — Progress Notes (Signed)
I have personally seen and examined this patient and agree with the assessment/plan as outlined above by Charlann Lange MD (PGY2). Plan to start HD today with overall decompensation- discussed with wife on risks/benefits of contrast exposure for CT abdomen with regards to diagnostic and treatment planning--- she is agreeable to proceed with contrast exposure understanding the risk and benefits. Vinie Charity K.,MD 07/04/2012 11:09 AM

## 2012-07-04 NOTE — Progress Notes (Signed)
Patient is a LT BKA and has prothesis at bedside. Patient is A&Ox3. Patient lives at home with wife. Patient has scabbed area to top of rt foot measuring 1x1cm, ota. Patient's rt first toe is amputated.  Patient's rt lower leg is dark and discolored patient states it has been like that for a long time. Patient placed on droplet precautions and contact precautions. Patient is on tele running STach. Patient oriented to unit and room. Will continue to monitor patient. Hassan Buckler

## 2012-07-04 NOTE — Consult Note (Signed)
I have seen and examined the pt and agree with PA-Osborne's note.  CT pending Supportive care per Medicine team. ?fungemia/menegitis?  Will follow.

## 2012-07-04 NOTE — Procedures (Signed)
Patient seen on Hemodialysis. QB 200, UF goal 1500 (Net Uf goal 1L) Soft BP noted (systolics [id number]) Treatment adjusted as needed.  Elmarie Shiley MD [facility]. Office # [phone] Pager # [phone] 1:16 PM

## 2012-07-04 NOTE — Progress Notes (Addendum)
Inpatient Diabetes Program Recommendations  AACE/ADA: New Consensus Statement on Inpatient Glycemic Control (2013)  Target Ranges:  Prepandial:   less than 140 mg/dL      Peak postprandial:   less than 180 mg/dL (1-2 hours)      Critically ill patients:  140 - 180 mg/dL   Insulin gtt has been initiated per GlucoStabilizer rate is ordered 0.1 units/h.  CBG entered was 73.  Patient needs Dextrose added to the fluids. Called Curtis RN charge nurse to notify that Dextrose needs to be added to the fluids since CBG is <250.  Also wanted to verify that insulin pump has been removed from patient and locked up or given to patient's family.  Vicente Serene RN will follow up. Thank you  Raoul Pitch BSN, RN,CDE Inpatient Diabetes Coordinator [phone] (team pager)  Addendum: Diabetes Coordinator obtained basal rate from insulin pump this morning.  Basal rate is 1.4 units per hour.

## 2012-07-04 NOTE — Progress Notes (Addendum)
ANTIBIOTIC CONSULT NOTE - FOLLOW UP  Pharmacy Consult for Vancomycin/Cefepime Indication: septic shock and rule out intra-abdominal infection  Allergies  Allergen Reactions  . Morphine And Related Hives and Nausea Only  . Amoxicillin Other (See Comments)    Reaction unknown  . Ciprofloxacin Other (See Comments)    Reaction unknown  . Clindamycin/Lincomycin Other (See Comments)    Reaction unknown  . Codeine Other (See Comments)    Reaction unknown. But pt has tolerated hydrocodone in Vicodin and Norco in the past  . Levofloxacin Other (See Comments)    Reaction unknown  . Vasotec Cough  . Oxycodone Rash    Patient Measurements: Height: 5\' 8"  (172.7 cm) Weight: 261 lb 14.5 oz (118.8 kg) IBW/kg (Calculated) : 68.4   Vital Signs: Temp: 103 F (39.4 C) (03/12 1431) Temp src: Oral (03/12 1431) BP: 102/49 mmHg (03/12 1602) Pulse Rate: 118 (03/12 1602) Intake/Output from previous day: 03/11 0701 - 03/12 0700 In: 1388.3 [P.O.:420; I.V.:968.3] Out: 802 [Urine:800; Stool:2] Intake/Output from this shift: Total I/O In: 200 [I.V.:200] Out: 1490 [Emesis/NG output:500; Other:990]  Labs:  Recent Labs  07/03/12 2014 07/04/12 0635 07/04/12 1026  WBC 12.2* 10.6* 9.3  HGB 11.2* 11.2* 10.4*  PLT 122* 122* PLATELET CLUMPS NOTED ON SMEAR, COUNT APPEARS DECREASED  CREATININE 6.89* 7.40* 6.62*   Estimated Creatinine Clearance: 17.5 ml/min (by C-G formula based on Cr of 6.62). No results found for this basename: VANCOTROUGH, Corlis Leak, VANCORANDOM, [place], GENTPEAK, GENTRANDOM, TOBRATROUGH, TOBRAPEAK, TOBRARND, AMIKACINPEAK, AMIKACINTROU, AMIKACIN,  in the last 72 hours   Microbiology: Recent Results (from the past 720 hour(s))  MRSA PCR SCREENING     Status: None   Collection Time    06/18/12  3:18 AM      Result Value Range Status   MRSA by PCR NEGATIVE  NEGATIVE Final   Comment:            The GeneXpert MRSA Assay (FDA     approved for NASAL specimens     only), is  one component of a     comprehensive MRSA colonization     surveillance program. It is not     intended to diagnose MRSA     infection nor to guide or     monitor treatment for     MRSA infections.  CLOSTRIDIUM DIFFICILE BY PCR     Status: None   Collection Time    07/03/12  3:54 PM      Result Value Range Status   C difficile by pcr NEGATIVE  NEGATIVE Final    Anti-infectives   Start     Dose/Rate Route Frequency Ordered Stop   07/05/12 1600  vancomycin (VANCOCIN) 1,750 mg in sodium chloride 0.9 % 500 mL IVPB  Status:  Discontinued     1,750 mg 250 mL/hr over 120 Minutes Intravenous Every 48 hours 07/03/12 1541 07/03/12 1918   07/04/12 1600  ceFEPIme (MAXIPIME) 1 g in dextrose 5 % 50 mL IVPB     1 g 100 mL/hr over 30 Minutes Intravenous Every 24 hours 07/04/12 0858     07/04/12 0900  metroNIDAZOLE (FLAGYL) IVPB 500 mg     500 mg 100 mL/hr over 60 Minutes Intravenous Every 8 hours 07/04/12 0849     07/04/12 0000  vancomycin (VANCOCIN) 50 mg/mL oral solution 250 mg  Status:  Discontinued     250 mg Oral 4 times per day 07/03/12 1918 07/04/12 0849   07/03/12 2200  metroNIDAZOLE (FLAGYL) tablet 500 mg  Status:  Discontinued     500 mg Oral 3 times per day 07/03/12 1918 07/04/12 0849   07/03/12 1800  vancomycin (VANCOCIN) 50 mg/mL oral solution 125 mg  Status:  Discontinued     125 mg Oral 4 times per day 07/03/12 1537 07/03/12 1918   07/03/12 1700  oseltamivir (TAMIFLU) capsule 75 mg     75 mg Oral Daily 07/03/12 1540 07/08/12 0959   07/03/12 1530  vancomycin (VANCOCIN) 2,000 mg in sodium chloride 0.9 % 500 mL IVPB     2,000 mg 250 mL/hr over 120 Minutes Intravenous  Once 07/03/12 1526 07/03/12 1913   07/03/12 1530  ceFEPIme (MAXIPIME) 1 g in dextrose 5 % 50 mL IVPB  Status:  Discontinued     1 g 100 mL/hr over 30 Minutes Intravenous Every 24 hours 07/03/12 1526 07/03/12 1918      Assessment: 47 yo M on empiric broad spectrum antibiotics  for septic shock with suspected  intra-abdominal infection. Patient presents with failing kidney transplant requiring hemodialysis. He is now s/p 2.5 hours of HD on a BFR of 200. He received a loading dose of vancomycin 2g IV. Patient also on cefepime, metronidazole, and oseltamivir.  Initial C. Diff, influenza panel negative other cx pending. Given patient's larger weight and lower loading dose, will provide a full post-HD dose despite shorter duration/lower BFR of HD.  Goal of Therapy:  Vancomycin trough level 15-20 mcg/ml  Plan:  -Vancomycin 1gm IV x 1 -Continue cefepime 1gm IV every 24 hours in the evening -Continue flagyl and tamiflu as ordered -F/u renal function, vancomycin pre-HD level, cultures, and clinical status  Wenda Low, PharmD Candidate 07/04/2012,4:06 PM  Agree with students plan as noted above.  Bola A. Beaulah Dinning D Clinical Pharmacist Pager:[phone] Phone [phone] 07/04/2012 4:51 PM  :

## 2012-07-05 ENCOUNTER — Inpatient Hospital Stay (HOSPITAL_COMMUNITY): Payer: Medicare Other

## 2012-07-05 DIAGNOSIS — K559 Vascular disorder of intestine, unspecified: Secondary | ICD-10-CM

## 2012-07-05 DIAGNOSIS — N189 Chronic kidney disease, unspecified: Secondary | ICD-10-CM

## 2012-07-05 DIAGNOSIS — N186 End stage renal disease: Secondary | ICD-10-CM

## 2012-07-05 LAB — BASIC METABOLIC PANEL
BUN: 81 mg/dL — ABNORMAL HIGH (ref 6–23)
BUN: 87 mg/dL — ABNORMAL HIGH (ref 6–23)
CO2: 19 mEq/L (ref 19–32)
CO2: 16 mEq/L — ABNORMAL LOW (ref 19–32)
Calcium: 7.7 mg/dL — ABNORMAL LOW (ref 8.4–10.5)
Calcium: 7.4 mg/dL — ABNORMAL LOW (ref 8.4–10.5)
Chloride: 101 mEq/L (ref 96–112)
Chloride: 98 mEq/L (ref 96–112)
Creatinine, Ser: 7.87 mg/dL — ABNORMAL HIGH (ref 0.50–1.35)
Creatinine, Ser: 8.52 mg/dL — ABNORMAL HIGH (ref 0.50–1.35)
GFR calc Af Amer: 8 mL/min — ABNORMAL LOW (ref >90)
GFR calc Af Amer: 8 mL/min — ABNORMAL LOW (ref >90)
GFR calc non Af Amer: 7 mL/min — ABNORMAL LOW (ref >90)
GFR calc non Af Amer: 7 mL/min — ABNORMAL LOW (ref >90)
Glucose, Bld: 115 mg/dL — ABNORMAL HIGH (ref 70–99)
Glucose, Bld: 328 mg/dL — ABNORMAL HIGH (ref 70–99)
Potassium: 2.7 mEq/L — CL (ref 3.5–5.1)
Potassium: 4.3 mEq/L (ref 3.5–5.1)
Sodium: 138 mEq/L (ref 135–145)
Sodium: 134 mEq/L — ABNORMAL LOW (ref 135–145)

## 2012-07-05 LAB — URINE CULTURE
Colony Count: NO GROWTH
Colony Count: NO GROWTH
Culture: NO GROWTH
Culture: NO GROWTH

## 2012-07-05 LAB — CBC
HCT: 29.4 % — ABNORMAL LOW (ref 39.0–52.0)
Hemoglobin: 9.8 g/dL — ABNORMAL LOW (ref 13.0–17.0)
MCH: 32.1 pg (ref 26.0–34.0)
MCHC: 33.3 g/dL (ref 30.0–36.0)
MCV: 96.4 fL (ref 78.0–100.0)
Platelets: UNDETERMINED 10*3/uL (ref 150–400)
RBC: 3.05 MIL/uL — ABNORMAL LOW (ref 4.22–5.81)
RDW: 16.2 % — ABNORMAL HIGH (ref 11.5–15.5)
WBC: 6.4 10*3/uL (ref 4.0–10.5)

## 2012-07-05 LAB — GLUCOSE, CAPILLARY
Glucose-Capillary: 101 mg/dL — ABNORMAL HIGH (ref 70–99)
Glucose-Capillary: 113 mg/dL — ABNORMAL HIGH (ref 70–99)
Glucose-Capillary: 119 mg/dL — ABNORMAL HIGH (ref 70–99)
Glucose-Capillary: 129 mg/dL — ABNORMAL HIGH (ref 70–99)
Glucose-Capillary: 152 mg/dL — ABNORMAL HIGH (ref 70–99)
Glucose-Capillary: 163 mg/dL — ABNORMAL HIGH (ref 70–99)
Glucose-Capillary: 163 mg/dL — ABNORMAL HIGH (ref 70–99)
Glucose-Capillary: 172 mg/dL — ABNORMAL HIGH (ref 70–99)
Glucose-Capillary: 210 mg/dL — ABNORMAL HIGH (ref 70–99)
Glucose-Capillary: 211 mg/dL — ABNORMAL HIGH (ref 70–99)
Glucose-Capillary: 212 mg/dL — ABNORMAL HIGH (ref 70–99)
Glucose-Capillary: 229 mg/dL — ABNORMAL HIGH (ref 70–99)
Glucose-Capillary: 231 mg/dL — ABNORMAL HIGH (ref 70–99)
Glucose-Capillary: 265 mg/dL — ABNORMAL HIGH (ref 70–99)

## 2012-07-05 LAB — BLOOD GAS, ARTERIAL
Acid-base deficit: 3.8 mmol/L — ABNORMAL HIGH (ref 0.0–2.0)
Bicarbonate: 19.8 mEq/L — ABNORMAL LOW (ref 20.0–24.0)
Drawn by: 33234
FIO2: 0.4 %
MECHVT: 550 mL
O2 Saturation: 99 %
PEEP: 5 cmH2O
Patient temperature: 98.6
RATE: 18 resp/min
TCO2: 20.7 mmol/L (ref 0–100)
pCO2 arterial: 30 mmHg — ABNORMAL LOW (ref 35.0–45.0)
pH, Arterial: 7.434 (ref 7.350–7.450)
pO2, Arterial: 173 mmHg — ABNORMAL HIGH (ref 80.0–100.0)

## 2012-07-05 LAB — HEMOGLOBIN AND HEMATOCRIT, BLOOD
HCT: 28 % — ABNORMAL LOW (ref 39.0–52.0)
HCT: 27.8 % — ABNORMAL LOW (ref 39.0–52.0)
Hemoglobin: 9.2 g/dL — ABNORMAL LOW (ref 13.0–17.0)
Hemoglobin: 9 g/dL — ABNORMAL LOW (ref 13.0–17.0)

## 2012-07-05 LAB — CLOSTRIDIUM DIFFICILE BY PCR: Toxigenic C. Difficile by PCR: NEGATIVE

## 2012-07-05 LAB — HEPATITIS B SURFACE ANTIBODY,QUALITATIVE: Hep B S Ab: REACTIVE — AB

## 2012-07-05 LAB — PHOSPHORUS: Phosphorus: 4.4 mg/dL (ref 2.3–4.6)

## 2012-07-05 LAB — CYCLOSPORINE: Cyclosporine, LabCorp: 116 ng/mL (ref 100–400)

## 2012-07-05 LAB — MAGNESIUM: Magnesium: 1.6 mg/dL (ref 1.5–2.5)

## 2012-07-05 MED ORDER — HYDROCORTISONE SOD SUCCINATE 100 MG IJ SOLR
50.0000 mg | Freq: Four times a day (QID) | INTRAMUSCULAR | Status: DC
  Administered 2012-07-05 – 2012-07-08 (×12): 50 mg via INTRAVENOUS
  Filled 2012-07-05 (×15): qty 1

## 2012-07-05 MED ORDER — ALTEPLASE 2 MG IJ SOLR
2.0000 mg | Freq: Once | INTRAMUSCULAR | Status: AC | PRN
  Filled 2012-07-05: qty 2

## 2012-07-05 MED ORDER — PANTOPRAZOLE SODIUM 40 MG IV SOLR
40.0000 mg | INTRAVENOUS | Status: DC
  Administered 2012-07-05 – 2012-07-06 (×2): 40 mg via INTRAVENOUS
  Filled 2012-07-05 (×3): qty 40

## 2012-07-05 MED ORDER — POTASSIUM CHLORIDE 10 MEQ/50ML IV SOLN
10.0000 meq | INTRAVENOUS | Status: AC
  Administered 2012-07-05 (×6): 10 meq via INTRAVENOUS
  Filled 2012-07-05: qty 200
  Filled 2012-07-05 (×2): qty 50

## 2012-07-05 MED ORDER — SODIUM CHLORIDE 0.9 % IV SOLN: 100.0000 mL | INTRAVENOUS | Status: DC | PRN

## 2012-07-05 MED ORDER — DEXTROSE 10 % IV SOLN
INTRAVENOUS | Status: DC | PRN
  Administered 2012-07-05: 40 mL/h via INTRAVENOUS

## 2012-07-05 MED ORDER — ACETAMINOPHEN 10 MG/ML IV SOLN
1000.0000 mg | Freq: Once | INTRAVENOUS | Status: AC
  Administered 2012-07-05: 1000 mg via INTRAVENOUS
  Filled 2012-07-05 (×2): qty 100

## 2012-07-05 MED ORDER — INSULIN GLARGINE 100 UNIT/ML ~~LOC~~ SOLN
10.0000 [IU] | Freq: Every day | SUBCUTANEOUS | Status: DC
  Administered 2012-07-05: 10 [IU] via SUBCUTANEOUS

## 2012-07-05 MED ORDER — MAGNESIUM SULFATE 40 MG/ML IJ SOLN
2.0000 g | Freq: Once | INTRAMUSCULAR | Status: AC
  Administered 2012-07-05: 2 g via INTRAVENOUS
  Filled 2012-07-05: qty 50

## 2012-07-05 MED ORDER — WHITE PETROLATUM GEL
Status: AC
  Administered 2012-07-05: 09:00:00
  Filled 2012-07-05: qty 5

## 2012-07-05 MED ORDER — INSULIN ASPART 100 UNIT/ML ~~LOC~~ SOLN
0.0000 [IU] | SUBCUTANEOUS | Status: DC
  Administered 2012-07-05: 8 [IU] via SUBCUTANEOUS
  Administered 2012-07-06: 3 [IU] via SUBCUTANEOUS
  Administered 2012-07-06 (×2): 5 [IU] via SUBCUTANEOUS

## 2012-07-05 MED ORDER — VANCOMYCIN HCL 1000 MG IV SOLR
750.0000 mg | Freq: Once | INTRAVENOUS | Status: AC
  Administered 2012-07-05: 750 mg via INTRAVENOUS
  Filled 2012-07-05: qty 750

## 2012-07-05 MED ORDER — HEPARIN SODIUM (PORCINE) 1000 UNIT/ML DIALYSIS: 2000.0000 [IU] | INTRAMUSCULAR | Status: DC | PRN

## 2012-07-05 MED ORDER — ALBUMIN HUMAN 25 % IV SOLN
12.5000 g | Freq: Once | INTRAVENOUS | Status: AC
  Administered 2012-07-05: 12.5 g via INTRAVENOUS
  Filled 2012-07-05: qty 50

## 2012-07-05 MED ORDER — VANCOMYCIN 50 MG/ML ORAL SOLUTION
500.0000 mg | Freq: Four times a day (QID) | ORAL | Status: DC
  Administered 2012-07-05 – 2012-07-07 (×8): 500 mg
  Filled 2012-07-05 (×13): qty 10

## 2012-07-05 MED ORDER — INSULIN ASPART 100 UNIT/ML ~~LOC~~ SOLN
1.0000 [IU] | SUBCUTANEOUS | Status: DC
  Administered 2012-07-05 (×2): 3 [IU] via SUBCUTANEOUS
  Administered 2012-07-05: 2 [IU] via SUBCUTANEOUS

## 2012-07-05 MED ORDER — INSULIN GLARGINE 100 UNIT/ML ~~LOC~~ SOLN
10.0000 [IU] | SUBCUTANEOUS | Status: DC
  Administered 2012-07-05: 10 [IU] via SUBCUTANEOUS

## 2012-07-05 NOTE — Progress Notes (Addendum)
[facility] [place]  Referred by:  Dr. Florene Glen (Nephrology)  Reason for referral: Evaluate right brachiocephalic arteriovenous fistula    History of Present Illness  Dale [name] is a 47 y.o. (08-05-1965) male on immunosuppressants presents with chief complaint: myalgias and diarrhea.  History is reconstructed from the chart as the patient is intubated and sedated currently.  Patient s/p CKT which has failed at this point.  The pt resumed hemodialysis via a R BC AVF placed by Dr. Kellie Simmering (07/29/11).  By report, the fistula was successfully cannulated previously.  Unclear why fistula cannot be cannulated at this point.  Past Medical History  Diagnosis Date  . Insulin dependent diabetes mellitus   . Hypertension   . History of blood transfusion   . ESRD (end stage renal disease)     a. 1995 s/p cadaveric transplant w/ susbequent failure after 18 yrs;  b.Dialysis initiated 07/2011  . Dyspnea     a. 07/23/11 Echo: EF 55-60%  . Anemia of chronic disease   . Cholecystitis     a. 08/27/2011  . Constipation   . Pericardial effusion     a.  Small by CT 08/22/11;  b.  Large by CT 08/27/11  . Inguinal lymphadenopathy     a. bilateral - s/p biopsy 07/2011    Past Surgical History  Procedure Laterality Date  . Appendectomy    . Hernia repair    . Kidney transplant    . Cataract extraction    . Arteriovenous graft placement    . Av fistula placement    . Toe amputation    . Below knee leg amputation    . Carpal tunnel release    . Shoulder surgery    . Av fistula placement  07/29/2011    Procedure: ARTERIOVENOUS (AV) FISTULA CREATION;  Surgeon: Mal Misty, MD;  Location: [facility];  Service: Vascular;  Laterality: Right;  Brachial cephalic  . Insertion of dialysis catheter  08/01/2011    Procedure: INSERTION OF DIALYSIS CATHETER;  Surgeon: Rosetta Posner, MD;  Location: [facility];  Service: Vascular;  Laterality: Right;  insertion of dialysis catheter on right internal jugular vein  .  Lymph node biopsy  08/10/2011    Procedure: LYMPH NODE BIOPSY;  Surgeon: Merrie Roof, MD;  Location: [facility];  Service: General;  Laterality: Left;  left groin lymph biopsy  . Colonoscopy  08/29/2011    Procedure: COLONOSCOPY;  Surgeon: Jerene Bears, MD;  Location: [facility];  Service: Gastroenterology;  Laterality: N/A;    History   Social History  . Marital Status: Married    Spouse Name: N/A    Number of Children: N/A  . Years of Education: N/A   Occupational History  . Disabled    Social History Main Topics  . Smoking status: Never Smoker   . Smokeless tobacco: Never Used  . Alcohol Use: Yes     Comment: occasionally 1xmonth  . Drug Use: No  . Sexually Active: Not on file   Other Topics Concern  . Not on file   Social History Narrative   Lives @ home with his wife in [place].     Family History  Problem Relation Age of Onset  . Malignant hyperthermia Neg Hx   . Colon polyps Father   . Diabetes Father   . Cancer Father   . Breast cancer Maternal Aunt   . Cancer Maternal Aunt     Breast and Bone  No current facility-administered medications on file prior to encounter.   Current Outpatient Prescriptions on File Prior to Encounter  Medication Sig Dispense Refill  . allopurinol (ZYLOPRIM) 100 MG tablet Take 200 mg by mouth daily. 2 Tabs      . amLODipine (NORVASC) 5 MG tablet Take 1 tablet (5 mg total) by mouth daily.  30 tablet  0  . aspirin 81 MG tablet Take 81 mg by mouth every evening.       . calcium acetate (PHOSLO) 667 MG capsule Take 1,334-2,001 mg by mouth 5 (five) times daily. 2001 mg with meals and 1334 mg with snacks      . cycloSPORINE modified (NEORAL/GENGRAF) 100 MG capsule Take 100 mg by mouth 2 (two) times daily. Neoral brand      . diphenhydrAMINE (BENADRYL) 25 mg capsule Take 50 mg by mouth at bedtime as needed for itching or sleep.      . furosemide (LASIX) 80 MG tablet 160 mg 3 (three) times daily.       [name] Kitchen GLUCAGON EMERGENCY 1 MG  injection Inject 1 mg into the muscle once as needed.       [name] Kitchen HYDROmorphone (DILAUDID) 2 MG tablet Take 2 mg by mouth 4 (four) times daily as needed. For pain      . Insulin Human (INSULIN PUMP) 100 unit/ml SOLN Inject into the skin continuous. Humalog      . levothyroxine (SYNTHROID, LEVOTHROID) 88 MCG tablet Take 88 mcg by mouth daily.      [name] Kitchen loratadine (CLARITIN) 10 MG tablet Take 10 mg by mouth daily.      . metoCLOPramide (REGLAN) 5 MG tablet Take 5 mg by mouth 4 (four) times daily - after meals and at bedtime.      . nitroGLYCERIN (NITROSTAT) 0.4 MG SL tablet Place 0.4 mg under the tongue every 5 (five) minutes as needed for chest pain.       . predniSONE (DELTASONE) 5 MG tablet Take 5 mg by mouth Daily.      . promethazine (PHENERGAN) 25 MG tablet Take 25 mg by mouth Every 6 hours as needed. For nausea      . PROVENTIL HFA 108 (90 BASE) MCG/ACT inhaler Inhale 1 puff into the lungs Every 6 hours as needed. For shortness of breath      . ranitidine (ZANTAC) 150 MG tablet Take 1 tablet (150 mg total) by mouth 2 (two) times daily.  60 tablet  3  . rOPINIRole (REQUIP) 1 MG tablet Take 1 mg by mouth Every evening. Take at 2000      . epoetin alfa (EPOGEN,PROCRIT) 16109 UNIT/ML injection Inject 40,000 Units into the skin every 21 ( twenty-one) days. If hemoglobin is below 12        Allergies  Allergen Reactions  . Morphine And Related Hives and Nausea Only  . Amoxicillin Other (See Comments)    Reaction unknown  . Ciprofloxacin Other (See Comments)    Reaction unknown  . Clindamycin/Lincomycin Other (See Comments)    Reaction unknown  . Codeine Other (See Comments)    Reaction unknown. But pt has tolerated hydrocodone in Vicodin and Norco in the past  . Levofloxacin Other (See Comments)    Reaction unknown  . Vasotec Cough  . Oxycodone Rash    REVIEW OF SYSTEMS:  (Positives checked otherwise negative)  Cannot be obtained due to intubation and sedation  Physical  Examination  Filed Vitals:   07/05/12 1400 07/05/12 1500 07/05/12 1600 07/05/12 1700  BP: 119/56 113/56 130/65 128/61  Pulse: 101 97 96 96  Temp:      TempSrc:      Resp: 12 18 18 19   Height:      Weight:      SpO2: 100% 100% 100% 100%   Body mass index is 39.83 kg/(m^2).  General: Intubated and sedated, obese  Head: [place]/AT  Ear/Nose/Throat: Nares w/o erythema or drainage, oropharynx cannot be examined due to intubation. Hearing cannot be tested  Eyes: pupils reactive, EOMI cannot be tested due to altered mental status  Neck: Supple, no nuchal rigidity, no palpable LAD  Pulmonary: Sym exp, good air movt, CTAB, no rhonchi, & wheezing, B rales with mechanical vent sounds  Cardiac: RRR, Nl S1, S2, no Murmurs, rubs or gallops  Vascular:  Right brachial artery palpable, faintly right radial artery palpable; easily palpable distal brachiocephalic arteriovenous fistula , more proximal fistula is more faintly palpable  Gastrointestinal: soft, +distended, - grimace to palpation, -G/R, - HSM, - masses, - CVAT B  Musculoskeletal: cannot be tested due to AMS  Neurologic: cannot be tested due to AMS  Psychiatric: cannot be tested due to AMS  Dermatologic: no rashes noted  Lymph : No Cervical, Axillary, or Inguinal lymphadenopathy   Laboratory: CBC:    Component Value Date/Time   WBC 6.4 07/05/2012 0355   RBC 3.05* 07/05/2012 0355   HGB 9.2* 07/05/2012 1026   HCT 28.0* 07/05/2012 1026   PLT PLATELET CLUMPS NOTED ON SMEAR, UNABLE TO ESTIMATE 07/05/2012 0355   MCV 96.4 07/05/2012 0355   MCH 32.1 07/05/2012 0355   MCHC 33.3 07/05/2012 0355   RDW 16.2* 07/05/2012 0355   LYMPHSABS 0.9 07/03/2012 1323   MONOABS 0.5 07/03/2012 1323   EOSABS 0.0 07/03/2012 1323   BASOSABS 0.0 07/03/2012 1323    BMP:    Component Value Date/Time   NA 138 07/05/2012 0355   K 2.7* 07/05/2012 0355   CL 101 07/05/2012 0355   CO2 19 07/05/2012 0355   GLUCOSE 115* 07/05/2012 0355   BUN 81* 07/05/2012 0355    CREATININE 7.87* 07/05/2012 0355   CALCIUM 7.7* 07/05/2012 0355   GFRNONAA 7* 07/05/2012 0355   GFRAA 8* 07/05/2012 0355    Coagulation: Lab Results  Component Value Date   INR 1.63* 07/04/2012   INR 1.63* 08/05/2011   No results found for this basename: PTT   Radiology: Ct Abdomen Pelvis Wo Contrast  07/04/2012  *RADIOLOGY REPORT*  Clinical Data: Abdominal distension, pain.  CT ABDOMEN AND PELVIS WITHOUT CONTRAST  Technique:  Multidetector CT imaging of the abdomen and pelvis was performed following the standard protocol without intravenous contrast.  Comparison: 07/04/2012 radiograph, 09/02/2011 CT  Findings: Bibasilar opacities.  NG tube descends into the stomach. Coronary artery calcification.  Trace pericardial fluid versus thickening.  There is extensive streak artifact from patient contact with the gantry.  In addition, images are degraded by the lack of intravenous contrast.  Within these limitations, no definitive acute abnormality identified involving the liver, spleen, biliary system, pancreas, or adrenal glands.  Atrophic native kidneys. Transplant in the left lower quadrant.  No hydronephrosis or hydroureter.  Rectal tube in place.  Mild colonic diverticulosis.  The ileal loops and proximal colon (to the level of the mid transverse colon) show circumferential wall thickening.  No bowel wall pneumatosis. No free intraperitoneal air.  Small amount of free fluid within the right paracolic gutter and pelvis.  No lymphadenopathy.  There is advanced atherosclerotic calcification of the aorta  and its branches. No aneurysmal dilatation. Further vascular evaluation not possible without intravenous contrast.  Foley within the decompressed bladder.  Calcified vas deferens, suggests underlying diabetes.  No acute osseous finding.  Surgical clips right groin.  IMPRESSION: Circumferential thickening of distal small bowel and proximal colon. In keeping with an enterocolitis.  Ischemia not excluded.   Discussed via telephone with Dr. Melonie Florida at 10:30 p.m. on 07/04/2012.   Original Report Authenticated By: Carlos Levering, M.D.    Dg Chest Port 1 View  07/05/2012  *RADIOLOGY REPORT*  Clinical Data: Evaluate endotracheal tube positioning  PORTABLE CHEST - 1 VIEW  Comparison: 07/04/2012; 07/03/2012; 06/17/2012  Findings: Grossly unchanged enlarged cardiac silhouette and mediastinal contours with apparent differences likely attributable to the decreased lung volumes and AP projection.  Stable position of support apparatus.  No pneumothorax.  Pulmonary vasculature is indistinct.  Grossly unchanged perihilar and bibasilar opacities, left greater than right.  Trace left-sided effusion is not excluded.  Unchanged bones.  IMPRESSION: 1.  Stable positioning of support apparatus.  No pneumothorax. 2.  Suspected mild pulmonary edema with possible trace left-sided effusion. 3.  Persistently reduced lung volumes with unchanged perihilar and bibasilar opacities, left greater than right, favored to represent atelectasis.   Original Report Authenticated By: Jake Seats, MD    Dg Chest Portable 1 View  07/04/2012  *RADIOLOGY REPORT*  Clinical Data: Central line placement and intubation.  Sepsis.  PORTABLE CHEST - 1 VIEW  Comparison: Chest 07/04/2012.  Findings: The patient has a new left IJ approach central venous catheter with the tip projecting over the lower superior vena cava. Endotracheal tube is in place with the tip in good position, 2 cm above the carina.  There is no pneumothorax.  Cardiomegaly without edema is noted.  IMPRESSION:  1.  ET tube and left IJ catheter in good position.  No pneumothorax or acute abnormality. 2.  Cardiomegaly.   Original Report Authenticated By: Orlean Patten, M.D.    Dg Chest Port 1 View  07/04/2012  *RADIOLOGY REPORT*  Clinical Data: Short of breath  PORTABLE CHEST - 1 VIEW  Comparison: Chest radiograph 07/03/2012  Findings: Stable mildly enlarged heart silhouette.  No  effusion, infiltrate, or pneumothorax.  Lung volumes.  IMPRESSION: Borderline enlarged heart silhouette.  No acute findings.   Original Report Authenticated By: Suzy Bouchard, M.D.    Dg Abd Portable 1v  07/05/2012  *RADIOLOGY REPORT*  Clinical Data: Abdominal distension  PORTABLE ABDOMEN - 1 VIEW  Comparison: CT abdomen pelvis - 07/04/2012  Findings:  An enteric tube tip and side port overlie the expected location of the gastric fundus.  There is a minimal amount of air within the gastric antrum. Otherwise, there is a paucity of bowel gas precluding evaluation for obstruction.  Evaluation of pneumoperitoneum is degraded secondary to supine positioning exclusion of the lower thorax.  No definite pneumatosis or portal venous gas.  There is a small amount of enteric contrast within the rectum.  Suspected radiolucent Foley catheter overlies the pelvis.  No acute osseous abnormalities.  IMPRESSION: 1.  Paucity of bowel gas, precluding evaluation for obstruction. 2.  Enteric tube tip and side port project over the expected location of the gastric fundus.   Original Report Authenticated By: Jake Seats, MD    Dg Abd Portable 2v  07/04/2012  *RADIOLOGY REPORT*  Clinical Data: Abdominal distention and pain.  PORTABLE ABDOMEN - 2 VIEW  Comparison: None.  Findings: Image quality is degraded by body habitus.  Stomach is air-filled and markedly distended.  There appears to be small bowel distention as well with associated air fluid levels.  The lower abdomen is not included on the images. Numerous high density structures overlying the right abdomen on the left lateral decubitus view are likely artifactual, given their apparent absence on the supine view.  IMPRESSION: Image quality is degraded by body habitus.  Findings are suspicious for a high-grade small bowel obstruction.   Original Report Authenticated By: Lorin Picket, M.D.    Medical Decision Making  Dale [name] is a 47 y.o. male who presents with: s/p BC  AVF with multiple active comorbidities including respiratory failure, diabetes, and possible sepsis.   By exam, the right brachiocephalic arteriovenous fistula appears to be adequate. The distal fistula is appropriately dilated given its proximity to its anastomosis.  The proximal cannulation might be difficult as I suspect some degree of depth in the more proximal segment.  Access duplex to evaluate the right brachiocephalic arteriovenous fistula for depth of the proximal fistula. Will have the tech mark the fistula route on the skin also.  Regardless, patient is not a candidate for any superficialization procedure at this point.  Given the infectious concerns including bacteremia, I would try to avoid placement of a tunneled dialysis catheter at this point, placing only a temporary dialysis catheter if difficulties with cannulation continue.   [name], MD [facility] Office: [phone] Pager: [phone]  07/05/2012, 5:19 PM

## 2012-07-05 NOTE — Progress Notes (Signed)
RN called E-link to report CBG x 2 >200. RN spoke with Diabetes RN today who recommended increasing patient's Lantus. E-link RN stated she would speak with Dr. Earnest Conroy.   Carla-Nt shift RN aware.

## 2012-07-05 NOTE — Procedures (Signed)
Unable to cannulate right upper arm AVF. With the exception of a 1 inch area on arterial end, access feels tortuous and deep.  Evidence of previous cannulation, but it appears this was along anastomosis.  Assessment confirmed by another dialysis RN.  K 2.7, Cr 7.87 today. Discussed with Dr. Florene Glen.  PLAN:  VVS consult to evaluate AVF and possible need for catheter.

## 2012-07-05 NOTE — Progress Notes (Signed)
eLink Physician-Brief Progress Note Patient Name: Dale [name] DOB: 1966-02-25 MRN: [id number]  Date of Service  07/05/2012   HPI/Events of Note  Call from nurse reporting that since fever has been reduced patient more hypotensive with BP of 71/43 (52).  All sedation has been stopped.  Had undergone dialysis earlier with removal of some volume.  CVP of 6.   eICU Interventions  Plan: 1 time dose of 25% albumin IV for BP support.  If this does not work to increase BP will start on NEO for BP support   Intervention Category Intermediate Interventions: Hypotension - evaluation and management  Dashton Czerwinski 07/05/2012, 3:14 AM

## 2012-07-05 NOTE — Progress Notes (Signed)
Inpatient Diabetes Program Recommendations  AACE/ADA: New Consensus Statement on Inpatient Glycemic Control (2013)  Target Ranges:  Prepandial:   less than 140 mg/dL      Peak postprandial:   less than 180 mg/dL (1-2 hours)      Critically ill patients:  140 - 180 mg/dL   Reason for Visit: note history of Type 1 Diabetes.  Insulin pump was removed yesterday on transfer to ICU.  His basal rate per the insulin pump was 1.4 units/hr (Total basal=33.6 units/day).   Patient was transitioned off insulin drip this morning to Lantus 10 units.  Consider increasing Lantus to 12 units bid.  Also if patient is placed on tube feeds, he will need CHO coverage for CHO in tube feeds.  Will follow.

## 2012-07-05 NOTE — Progress Notes (Signed)
PULMONARY  / CRITICAL CARE MEDICINE  Name: Dale [name] MRN: [id number] DOB: 08-01-1965    ADMISSION DATE:  07/03/2012 CONSULTATION DATE:  07/04/12  REFERRING MD :  TRH  CHIEF COMPLAINT:  Respiratory failure and septic shock.  BRIEF PATIENT DESCRIPTION: 47 year old male Type 1 diabetic with HTN and ESRD s/p transplant with baseline cr of 5 that now increased to 7 with abdominal distension and severe diarrhea.  PCCM was called on 3/12 due to increased work of breathing, hypotension, increased abdominal distension and feculent NGT aspirate.  SIGNIFICANT EVENTS / STUDIES:  3/12 VDRF and septic shock. 3/12 CT abd/pelvis >Circumferential thickening of distal small bowel and proximal colon. In keeping with an enterocolitis. Ischemia not excluded.  LINES / TUBES: L IJ TLC 3/12>>> ET tube 3/12>>> Foley 3/12>>> NGT 3/12>>>  CULTURES: Blood 3/12>>> Urine 3/12>>>neg  Sputum 3/12>>> C. diff 3/12>>>neg  Influz  3/11 >>neg  3/13 CMV >> 3/14 C. Diff >>  ANTIBIOTICS: Vanc 3/12 >>> Flagyl 3/11>>> Cefepime 3/11>>> Tamiflu 3/10>> (stop 3/15)  SUBJECTIVE:  Wakes to voice , follows simple commands  Fevers persist  Never needed pressors, CVP 5-7  VITAL SIGNS: Temp:  [99 F (37.2 C)-103.1 F (39.5 C)] 99 F (37.2 C) (03/13 0844) Pulse Rate:  [102-125] 102 (03/13 1200) Resp:  [18-33] 19 (03/13 1200) BP: (70-134)/(38-64) 119/50 mmHg (03/13 1200) SpO2:  [50 %-100 %] 100 % (03/13 1200) Arterial Line BP: (67-149)/(29-72) 117/49 mmHg (03/13 1200) FiO2 (%):  [40 %-100 %] 40 % (03/13 1200) Weight:  [118.8 kg (261 lb 14.5 oz)] 118.8 kg (261 lb 14.5 oz) (03/13 0457) HEMODYNAMICS: CVP:  [0 mmHg-13 mmHg] 5 mmHg VENTILATOR SETTINGS: Vent Mode:  [-] PRVC FiO2 (%):  [40 %-100 %] 40 % Set Rate:  [18 bmp] 18 bmp Vt Set:  [550 mL] 550 mL PEEP:  [5 cmH20] 5 cmH20 Pressure Support:  [12 cmH20] 12 cmH20 Plateau Pressure:  [17 cmH20-19 cmH20] 18 cmH20 INTAKE / OUTPUT: Intake/Output     03/12 0701 -  03/13 0700 03/13 0701 - 03/14 0700   P.O.     I.V. (mL/kg) 1759 (14.8) 112 (0.9)   NG/GT 20    IV Piggyback 400 50   Total Intake(mL/kg) 2179 (18.3) 162 (1.4)   Urine (mL/kg/hr) 250 (0.1) 30 (0)   Emesis/NG output 1300 (0.5) 100 (0.1)   Other 990 (0.3)    Stool 1950 (0.7) 50 (0.1)   Total Output 4490 180   Net -2311.1 -18         PHYSICAL EXAMINATION: General:  Chronically ill appearing male Neuro:   Sedated , follows simple commands  HEENT:  ETT  Cardiovascular:  Tachy but regular, Nl S1/S2, -M/R/G. Lungs:  Diminshed BS in bases  Abdomen:  Soft, diffusely tender, distended with hypoactive bowel sounds. Musculoskeletal:  L BKA, otherwise normal. Skin:  Bruising but otherwise intact.  LABS:  Recent Labs Lab 07/03/12 1323 07/03/12 1415  07/04/12 0635 07/04/12 0910 07/04/12 1013 07/04/12 1026 07/04/12 1225 07/04/12 1626 07/05/12 0355 07/05/12 0435 07/05/12 1026  HGB 11.4*  --   < > 11.2*  --   --  10.4*  --  9.9* 9.8*  --  9.2*  WBC 18.4*  --   < > 10.6*  --   --  9.3  --   --  6.4  --   --   PLT 149*  --   < > 122*  --   --  PLATELET CLUMPS NOTED ON SMEAR, COUNT APPEARS DECREASED  --   --  PLATELET CLUMPS NOTED ON SMEAR, UNABLE TO ESTIMATE  --   --   NA 132*  --   --  137  --   --  137  --   --  138  --   --   K 3.8  --   --  3.1*  --   --  2.9*  --   --  2.7*  --   --   CL 96  --   --  100  --   --  102  --   --  101  --   --   CO2 18*  --   --  14*  --   --  17*  --   --  19  --   --   GLUCOSE 188*  --   --  125*  --   --  90  --   --  115*  --   --   BUN 110*  --   --  114*  --   --  98*  --   --  81*  --   --   CREATININE 6.60*  --   < > 7.40*  --   --  6.62*  --   --  7.87*  --   --   CALCIUM 8.9  --   --  8.3*  --   --  8.0*  --   --  7.7*  --   --   MG  --   --   --   --   --   --   --   --   --  1.6  --   --   PHOS  --   --   --  3.6  --   --   --   --   --  4.4  --   --   AST 19  --   --  42*  --   --  57*  --   --   --   --   --   ALT 10  --   --  12  --    --  13  --   --   --   --   --   ALKPHOS 67  --   --  59  --   --  51  --   --   --   --   --   BILITOT 0.4  --   --  0.4  --   --  0.4  --   --   --   --   --   PROT 7.3  --   --  6.9  --   --  6.5  --   --   --   --   --   ALBUMIN 2.3*  --   --  2.2*  --   --  2.0*  --   --   --   --   --   APTT  --   --   --   --   --   --  44*  --   --   --   --   --   INR  --   --   --   --   --   --  1.63*  --   --   --   --   --   LATICACIDVEN  --  2.73*  --   --   --  0.9  --   --   --   --   --   --   TROPONINI  --   --   --   --   --   --   --  0.60*  --   --   --   --   PROCALCITON  --   --   --   --   --  >175.00  --   --   --   --   --   --   PHART  --   --   --   --  7.381  --   --  7.431  --   --  7.434  --   PCO2ART  --   --   --   --  22.2*  --   --  30.4*  --   --  30.0*  --   PO2ART  --   --   --   --  113.0*  --   --  399.0*  --   --  173.0*  --   < > = values in this interval not displayed.  Recent Labs Lab 07/04/12 1347 07/04/12 1453 07/04/12 1556 07/04/12 1810 07/04/12 1958  GLUCAP 73 79 89 130* 260*    CXR: Suspected mild pulmonary edema with possible trace left-sided effusion. . Persistently reduced lung volumes with unchanged perihilar and  bibasilar opacities, left greater than right, favored to represent atelectasis.   ASSESSMENT / PLAN:  PULMONARY A: VDRF likely due to abdominal distension  And infection  P:   - Continue vent support  - F/U ABG and CXR.in am  - vent setting per Dr. Lamonte Sakai   CARDIOVASCULAR A: Septic shock, likely abdominal source. 3/13 : b/p improved with fluid resus. No pressors needed  P:   - Hold anti-HTN, was on coreg so will need to watch for tachy without beta blockers. - Continue ASA.   RENAL A:  Acute on chronic renal failure. Hypokalemia  Chronic immunosuppression with cyclosporin P:   - Patient has a fistula,  HD today per renal  - follow electrolytes and replace as indicated.   GASTROINTESTINAL A:  Intrabdominal infection  likely, c. Diff negative x1 (PCR).  Diarrhea and feculent material from NGT. CT abd/pelvis 3/12 >Circumferential thickening of distal small bowel and proximal colon.  P:   - NGT to suction. - Surgery following. - recheck c. Diff   HEMATOLOGIC A:  Leukocytosis from infection on presentation, then leukopenia.  H&H stable P:  - Follow H&H. - Stool OB.  INFECTIOUS A:  Likely abdominal source but covering broadly. Entire clinical picture most worrisome for C diff although initial testing negative.  P:   - Follow cx data . - Cefepime/vanc/flagyl and tamiflu ordered - add enteral vanco 3/13 given concern for C diff even though initial testing negative - Finish course of tamiflu since already started. - Repeat C. Diff. pending - check PCT in am  - will add empiric anti-fungals if he spikes temp or decompensates given immunosuppressed state   ENDOCRINE A:  DM.  -type 1  Chronic pred use and presumed adrenal insufficiency P:   - ISS. - d/c lantus with worsening renal fxn - convert pred to hydrocort stress dose 3/13  NEUROLOGIC A:  Sedated  P:   - Continuous sedation protocol for now.  I have personally obtained a history, examined the patient, evaluated laboratory and imaging results, formulated the assessment and plan and placed orders.  CRITICAL CARE: The patient is critically ill with multiple organ systems failure and requires high complexity decision making for assessment and support, frequent evaluation and titration of therapies, application of advanced monitoring technologies and extensive interpretation of multiple databases. Critical Care Time devoted to patient care services described in this note is 45 minutes.   [facility] NP -C  Pulmonary and [place] Pager: [phone]  07/05/2012, 2:06 PM  Baltazar Apo, MD, PhD 07/05/2012, 3:21 PM [facility] [phone] or if no answer [phone]

## 2012-07-05 NOTE — Progress Notes (Signed)
I have personally seen and examined this patient and agree with the assessment/plan as outlined above by Charlann Lange MD (PGY2). Plan for HD today given metabolic derangements and overall clinical picture. Need to repeat CT abdomen/ Pelvis with IV contrast for better imaging (unclear why not done in the first place). Agree with CMV titers and increasing coverage to include fungi.  Bertrum Helmstetter K.,MD 07/05/2012 9:14 AM

## 2012-07-05 NOTE — Progress Notes (Signed)
Subjective: Pt with no acute issues overnight.  CT WITH IV CONTRAST, approved by Nephro, not done, instead CT w/ only PO done.  No Surgeon was called about that change.  CT scan only shows thickened SB and prox colon.  No other intraabd processes.  Objective: Vital signs in last 24 hours: Temp:  [101.5 F (38.6 C)-105.2 F (40.7 C)] 101.5 F (38.6 C) (03/13 0312) Pulse Rate:  [34-127] 106 (03/13 0827) Resp:  [18-48] 21 (03/13 0827) BP: (70-140)/(34-71) 121/47 mmHg (03/13 0827) SpO2:  [50 %-100 %] 99 % (03/13 0827) Arterial Line BP: (67-180)/(29-72) 128/44 mmHg (03/13 0700) FiO2 (%):  [40 %-100 %] 40 % (03/13 0827) Weight:  [261 lb 14.5 oz (118.8 kg)-265 lb 6.9 oz (120.4 kg)] 261 lb 14.5 oz (118.8 kg) (03/13 0457) Last BM Date: 07/04/12  Intake/Output from previous day: 03/12 0701 - 03/13 0700 In: 2179 [I.V.:1759; NG/GT:20; IV Piggyback:400] Out: 4490 [Urine:250; Emesis/NG output:1300; [id number] Intake/Output this shift:    General appearance: sedated Cardio: regular rhythm, tachy GI: soft, min ttp, min dist, no rebound/ guarding  Lab Results:   Recent Labs  07/04/12 1026 07/04/12 1626 07/05/12 0355  WBC 9.3  --  6.4  HGB 10.4* 9.9* 9.8*  HCT 30.9* 30.5* 29.4*  PLT PLATELET CLUMPS NOTED ON SMEAR, COUNT APPEARS DECREASED  --  PLATELET CLUMPS NOTED ON SMEAR, UNABLE TO ESTIMATE   BMET  Recent Labs  07/04/12 1026 07/05/12 0355  NA 137 138  K 2.9* 2.7*  CL 102 101  CO2 17* 19  GLUCOSE 90 115*  BUN 98* 81*  CREATININE 6.62* 7.87*  CALCIUM 8.0* 7.7*   PT/INR  Recent Labs  07/04/12 1026  LABPROT 18.8*  INR 1.63*   ABG  Recent Labs  07/04/12 1225 07/05/12 0435  PHART 7.431 7.434  HCO3 20.1 19.8*    Studies/Results: Ct Abdomen Pelvis Wo Contrast  07/04/2012  *RADIOLOGY REPORT*  Clinical Data: Abdominal distension, pain.  CT ABDOMEN AND PELVIS WITHOUT CONTRAST  Technique:  Multidetector CT imaging of the abdomen and pelvis was performed following  the standard protocol without intravenous contrast.  Comparison: 07/04/2012 radiograph, 09/02/2011 CT  Findings: Bibasilar opacities.  NG tube descends into the stomach. Coronary artery calcification.  Trace pericardial fluid versus thickening.  There is extensive streak artifact from patient contact with the gantry.  In addition, images are degraded by the lack of intravenous contrast.  Within these limitations, no definitive acute abnormality identified involving the liver, spleen, biliary system, pancreas, or adrenal glands.  Atrophic native kidneys. Transplant in the left lower quadrant.  No hydronephrosis or hydroureter.  Rectal tube in place.  Mild colonic diverticulosis.  The ileal loops and proximal colon (to the level of the mid transverse colon) show circumferential wall thickening.  No bowel wall pneumatosis. No free intraperitoneal air.  Small amount of free fluid within the right paracolic gutter and pelvis.  No lymphadenopathy.  There is advanced atherosclerotic calcification of the aorta and its branches. No aneurysmal dilatation. Further vascular evaluation not possible without intravenous contrast.  Foley within the decompressed bladder.  Calcified vas deferens, suggests underlying diabetes.  No acute osseous finding.  Surgical clips right groin.  IMPRESSION: Circumferential thickening of distal small bowel and proximal colon. In keeping with an enterocolitis.  Ischemia not excluded.  Discussed via telephone with Dr. Melonie Florida at 10:30 p.m. on 07/04/2012.   Original Report Authenticated By: Carlos Levering, M.D.    Dg Chest 2 View  07/03/2012  *RADIOLOGY REPORT*  Clinical  Data: Fever and nausea  CHEST - 2 VIEW  Comparison: June 17, 2012  Findings: The lungs are clear.  The heart size and pulmonary vascularity are within normal limits.  No adenopathy.  There is degenerative change in the thoracic spine.  IMPRESSION: No edema or consolidation.   Original Report Authenticated By: Lowella Grip, M.D.    Dg Chest Port 1 View  07/05/2012  *RADIOLOGY REPORT*  Clinical Data: Evaluate endotracheal tube positioning  PORTABLE CHEST - 1 VIEW  Comparison: 07/04/2012; 07/03/2012; 06/17/2012  Findings: Grossly unchanged enlarged cardiac silhouette and mediastinal contours with apparent differences likely attributable to the decreased lung volumes and AP projection.  Stable position of support apparatus.  No pneumothorax.  Pulmonary vasculature is indistinct.  Grossly unchanged perihilar and bibasilar opacities, left greater than right.  Trace left-sided effusion is not excluded.  Unchanged bones.  IMPRESSION: 1.  Stable positioning of support apparatus.  No pneumothorax. 2.  Suspected mild pulmonary edema with possible trace left-sided effusion. 3.  Persistently reduced lung volumes with unchanged perihilar and bibasilar opacities, left greater than right, favored to represent atelectasis.   Original Report Authenticated By: Jake Seats, MD    Dg Chest Portable 1 View  07/04/2012  *RADIOLOGY REPORT*  Clinical Data: Central line placement and intubation.  Sepsis.  PORTABLE CHEST - 1 VIEW  Comparison: Chest 07/04/2012.  Findings: The patient has a new left IJ approach central venous catheter with the tip projecting over the lower superior vena cava. Endotracheal tube is in place with the tip in good position, 2 cm above the carina.  There is no pneumothorax.  Cardiomegaly without edema is noted.  IMPRESSION:  1.  ET tube and left IJ catheter in good position.  No pneumothorax or acute abnormality. 2.  Cardiomegaly.   Original Report Authenticated By: Orlean Patten, M.D.    Dg Chest Port 1 View  07/04/2012  *RADIOLOGY REPORT*  Clinical Data: Short of breath  PORTABLE CHEST - 1 VIEW  Comparison: Chest radiograph 07/03/2012  Findings: Stable mildly enlarged heart silhouette.  No effusion, infiltrate, or pneumothorax.  Lung volumes.  IMPRESSION: Borderline enlarged heart silhouette.  No acute  findings.   Original Report Authenticated By: Suzy Bouchard, M.D.    Dg Abd Portable 1v  07/05/2012  *RADIOLOGY REPORT*  Clinical Data: Abdominal distension  PORTABLE ABDOMEN - 1 VIEW  Comparison: CT abdomen pelvis - 07/04/2012  Findings:  An enteric tube tip and side port overlie the expected location of the gastric fundus.  There is a minimal amount of air within the gastric antrum. Otherwise, there is a paucity of bowel gas precluding evaluation for obstruction.  Evaluation of pneumoperitoneum is degraded secondary to supine positioning exclusion of the lower thorax.  No definite pneumatosis or portal venous gas.  There is a small amount of enteric contrast within the rectum.  Suspected radiolucent Foley catheter overlies the pelvis.  No acute osseous abnormalities.  IMPRESSION: 1.  Paucity of bowel gas, precluding evaluation for obstruction. 2.  Enteric tube tip and side port project over the expected location of the gastric fundus.   Original Report Authenticated By: Jake Seats, MD    Dg Abd Portable 2v  07/04/2012  *RADIOLOGY REPORT*  Clinical Data: Abdominal distention and pain.  PORTABLE ABDOMEN - 2 VIEW  Comparison: None.  Findings: Image quality is degraded by body habitus.  Stomach is air-filled and markedly distended.  There appears to be small bowel distention as well with associated air fluid levels.  The lower abdomen is not included on the images. Numerous high density structures overlying the right abdomen on the left lateral decubitus view are likely artifactual, given their apparent absence on the supine view.  IMPRESSION: Image quality is degraded by body habitus.  Findings are suspicious for a high-grade small bowel obstruction.   Original Report Authenticated By: Lorin Picket, M.D.     Anti-infectives: Anti-infectives   Start     Dose/Rate Route Frequency Ordered Stop   07/05/12 1600  vancomycin (VANCOCIN) 1,750 mg in sodium chloride 0.9 % 500 mL IVPB  Status:  Discontinued      1,750 mg 250 mL/hr over 120 Minutes Intravenous Every 48 hours 07/03/12 1541 07/03/12 1918   07/04/12 1700  vancomycin (VANCOCIN) IVPB 1000 mg/200 mL premix     1,000 mg 200 mL/hr over 60 Minutes Intravenous  Once 07/04/12 1648 07/04/12 1851   07/04/12 1600  ceFEPIme (MAXIPIME) 1 g in dextrose 5 % 50 mL IVPB     1 g 100 mL/hr over 30 Minutes Intravenous Every 24 hours 07/04/12 0858     07/04/12 0900  metroNIDAZOLE (FLAGYL) IVPB 500 mg     500 mg 100 mL/hr over 60 Minutes Intravenous Every 8 hours 07/04/12 0849     07/04/12 0000  vancomycin (VANCOCIN) 50 mg/mL oral solution 250 mg  Status:  Discontinued     250 mg Oral 4 times per day 07/03/12 1918 07/04/12 0849   07/03/12 2200  metroNIDAZOLE (FLAGYL) tablet 500 mg  Status:  Discontinued     500 mg Oral 3 times per day 07/03/12 1918 07/04/12 0849   07/03/12 1800  vancomycin (VANCOCIN) 50 mg/mL oral solution 125 mg  Status:  Discontinued     125 mg Oral 4 times per day 07/03/12 1537 07/03/12 1918   07/03/12 1700  oseltamivir (TAMIFLU) capsule 75 mg     75 mg Oral Daily 07/03/12 1540 07/08/12 0959   07/03/12 1530  vancomycin (VANCOCIN) 2,000 mg in sodium chloride 0.9 % 500 mL IVPB     2,000 mg 250 mL/hr over 120 Minutes Intravenous  Once 07/03/12 1526 07/03/12 1913   07/03/12 1530  ceFEPIme (MAXIPIME) 1 g in dextrose 5 % 50 mL IVPB  Status:  Discontinued     1 g 100 mL/hr over 30 Minutes Intravenous Every 24 hours 07/03/12 1526 07/03/12 1918      Assessment/Plan: s/p * No surgery found * No surgical indications at this time.  May need to be tested for CMV since pt is a transplant pt.  No adenopathy on CT so process likely not related to PTLD  BCx's pending still, may need coverage for fungal infection. Con't NGT With diarrhea likely can d/c reglan Will follow   LOS: 2 days    Rosario Jacks., [facility] 07/05/2012

## 2012-07-05 NOTE — Progress Notes (Signed)
eLink Physician-Brief Progress Note Patient Name: Dale [name] DOB: Mar 17, 1966 MRN: [id number]  Date of Service  07/05/2012   HPI/Events of Note  Hypokalemia in the setting of renal failure with ongoing diarrhea losses    eICU Interventions  Plan: Potassium replaced Repeat BMET after K runs   Intervention Category Intermediate Interventions: Electrolyte abnormality - evaluation and management  Tyshaun Vinzant 07/05/2012, 5:18 AM

## 2012-07-05 NOTE — Progress Notes (Signed)
[facility] - PROGRESS NOTE Resident Note   Please see below for attending addendum to resident note.  Subjective:    Intubated and able to follow commands. Continuous diarrhea. Patient was hypotensive and received 1 dose of albumin.  Fever of 102.8 last night treated with IV tylenol  Objective:    Vital Signs:   Temp:  [99 F (37.2 C)-105.2 F (40.7 C)] 99 F (37.2 C) (03/13 0844) Pulse Rate:  [34-125] 106 (03/13 0827) Resp:  [18-45] 21 (03/13 0827) BP: (70-134)/(34-65) 121/47 mmHg (03/13 0827) SpO2:  [50 %-100 %] 99 % (03/13 0827) Arterial Line BP: (67-180)/(29-72) 128/44 mmHg (03/13 0700) FiO2 (%):  [40 %-100 %] 40 % (03/13 0827) Weight:  [261 lb 14.5 oz (118.8 kg)-265 lb 6.9 oz (120.4 kg)] 261 lb 14.5 oz (118.8 kg) (03/13 0457) Last BM Date: 07/04/12  24-hour weight change: Weight change: -8 lb 9.1 oz (-3.886 kg)  Weight trends: Filed Weights   07/04/12 1150 07/04/12 1431 07/05/12 0457  Weight: 265 lb 6.9 oz (120.4 kg) 261 lb 14.5 oz (118.8 kg) 261 lb 14.5 oz (118.8 kg)    Intake/Output:  03/12 0701 - 03/13 0700 In: 2179 [I.V.:1759; NG/GT:20; IV Piggyback:400] Out: [id number] [Urine:250; Emesis/NG output:1300; [id number]  Physical Exam:  General:  Vital signs reviewed and noted. Acute ill appearing/ obesity   Head:  Normocephalic, atraumatic.   Eyes:  PERRL, EOMI, No signs of anemia or jaundince.   Nose:  Mucous membranes dry, not inflammed, nonerythematous.   Throat:  Oropharynx nonerythematous, no exudate appreciated.   Neck:  No deformities, masses, or tenderness noted.Supple, No carotid Bruits, no JVD.   Lungs:  Normal respiratory effort. Clear to auscultation BL without crackles or wheezes.   Heart:  RRR. S1 and S2 normal without gallop, murmur, or rubs.   Abdomen:  Hypoactive BS. Soft, + distended, non-tender. No masses or organomegaly.   Extremities:  No pretibial edema. RUA AVF + thrill and bruit.   Neurologic:  A&O X3, CN II - XII are  grossly intact. Motor strength is 5/5 in the all 4 extremities, Sensations intact to light touch, Cerebellar signs negative.   Skin:  No visible rashes, scars.      Labs: Basic Metabolic Panel:  Recent Labs Lab 07/04/12 0635 07/04/12 1026 07/05/12 0355  Pretty Weltman 137 137 138  K 3.1* 2.9* 2.7*  CL 100 102 101  CO2 14* 17* 19  GLUCOSE 125* 90 115*  BUN 114* 98* 81*  CREATININE 7.40* 6.62* 7.87*  CALCIUM 8.3* 8.0* 7.7*  MG  --   --  1.6  PHOS 3.6  --  4.4    Liver Function Tests:  Recent Labs Lab 07/03/12 1323 07/04/12 0635 07/04/12 1026  AST 19 42* 57*  ALT 10 12 13   ALKPHOS 67 59 51  BILITOT 0.4 0.4 0.4  PROT 7.3 6.9 6.5  ALBUMIN 2.3* 2.2* 2.0*   No results found for this basename: LIPASE, AMYLASE,  in the last 168 hours No results found for this basename: AMMONIA,  in the last 168 hours  CBC:  Recent Labs Lab 07/03/12 1323  07/04/12 0635 07/04/12 1026 07/04/12 1626 07/05/12 0355  WBC 18.4*  < > 10.6* 9.3  --  6.4  NEUTROABS 17.0*  --   --   --   --   --   HGB 11.4*  < > 11.2* 10.4* 9.9* 9.8*  HCT 34.7*  < > 34.0* 30.9* 30.5* 29.4*  MCV 97.2  < > 97.7 95.4  --  96.4  PLT 149*  < > 122* PLATELET CLUMPS NOTED ON SMEAR, COUNT APPEARS DECREASED  --  PLATELET CLUMPS NOTED ON SMEAR, UNABLE TO ESTIMATE  < > = values in this interval not displayed.  Cardiac Enzymes:  Recent Labs Lab 07/03/12 2014 07/04/12 1225  CKTOTAL 154  --   TROPONINI  --  0.60*    BNP: No components found with this basename: POCBNP,   CBG:  Recent Labs Lab 07/04/12 1347 07/04/12 1453 07/04/12 1556 07/04/12 1810 07/04/12 1958  GLUCAP 73 79 89 130* 260*    Microbiology: Results for orders placed during the hospital encounter of 07/03/12  CULTURE, BLOOD (ROUTINE X 2)     Status: None   Collection Time    07/03/12  1:50 PM      Result Value Range Status   Specimen Description BLOOD LEFT FOREARM   Final   Special Requests BOTTLES DRAWN AEROBIC ONLY 10CC   Final   Culture   Setup Time 07/04/2012 04:59   Final   Culture     Final   Value:        BLOOD CULTURE RECEIVED NO GROWTH TO DATE CULTURE WILL BE HELD FOR 5 DAYS BEFORE ISSUING A FINAL NEGATIVE REPORT   Report Status PENDING   Incomplete  URINE CULTURE     Status: None   Collection Time    07/03/12  2:51 PM      Result Value Range Status   Specimen Description URINE, CLEAN CATCH   Final   Special Requests Immunocompromised   Final   Culture  Setup Time 07/03/2012 15:45   Final   Colony Count NO GROWTH   Final   Culture NO GROWTH   Final   Report Status 07/05/2012 FINAL   Final  CLOSTRIDIUM DIFFICILE BY PCR     Status: None   Collection Time    07/03/12  3:54 PM      Result Value Range Status   C difficile by pcr NEGATIVE  NEGATIVE Final  CULTURE, BLOOD (ROUTINE X 2)     Status: None   Collection Time    07/03/12  4:20 PM      Result Value Range Status   Specimen Description BLOOD HAND LEFT   Final   Special Requests BOTTLES DRAWN AEROBIC AND ANAEROBIC 10CC   Final   Culture  Setup Time 07/04/2012 02:22   Final   Culture     Final   Value:        BLOOD CULTURE RECEIVED NO GROWTH TO DATE CULTURE WILL BE HELD FOR 5 DAYS BEFORE ISSUING A FINAL NEGATIVE REPORT   Report Status PENDING   Incomplete  CULTURE, RESPIRATORY (NON-EXPECTORATED)     Status: None   Collection Time    07/04/12 12:20 PM      Result Value Range Status   Specimen Description TRACHEAL ASPIRATE   Final   Special Requests NONE   Final   Gram Stain     Final   Value: RARE WBC PRESENT, PREDOMINANTLY MONONUCLEAR     RARE SQUAMOUS EPITHELIAL CELLS PRESENT     NO ORGANISMS SEEN   Culture PENDING   Incomplete   Report Status PENDING   Incomplete    Coagulation Studies:  Recent Labs  07/04/12 1026  LABPROT 18.8*  INR 1.63*    Urinalysis:    Component Value Date/Time   COLORURINE YELLOW 07/04/2012 1217   APPEARANCEUR HAZY* 07/04/2012 1217   LABSPEC 1.018 07/04/2012 [place]  5.5 07/04/2012 1217   GLUCOSEU NEGATIVE  07/04/2012 1217   HGBUR LARGE* 07/04/2012 1217   BILIRUBINUR NEGATIVE 07/04/2012 1217   KETONESUR 15* 07/04/2012 1217   PROTEINUR >300* 07/04/2012 1217   UROBILINOGEN 0.2 07/04/2012 1217   NITRITE NEGATIVE 07/04/2012 1217   LEUKOCYTESUR SMALL* 07/04/2012 1217     Imaging: Ct Abdomen Pelvis Wo Contrast  07/04/2012  *RADIOLOGY REPORT*  Clinical Data: Abdominal distension, pain.  CT ABDOMEN AND PELVIS WITHOUT CONTRAST  Technique:  Multidetector CT imaging of the abdomen and pelvis was performed following the standard protocol without intravenous contrast.  Comparison: 07/04/2012 radiograph, 09/02/2011 CT  Findings: Bibasilar opacities.  NG tube descends into the stomach. Coronary artery calcification.  Trace pericardial fluid versus thickening.  There is extensive streak artifact from patient contact with the gantry.  In addition, images are degraded by the lack of intravenous contrast.  Within these limitations, no definitive acute abnormality identified involving the liver, spleen, biliary system, pancreas, or adrenal glands.  Atrophic native kidneys. Transplant in the left lower quadrant.  No hydronephrosis or hydroureter.  Rectal tube in place.  Mild colonic diverticulosis.  The ileal loops and proximal colon (to the level of the mid transverse colon) show circumferential wall thickening.  No bowel wall pneumatosis. No free intraperitoneal air.  Small amount of free fluid within the right paracolic gutter and pelvis.  No lymphadenopathy.  There is advanced atherosclerotic calcification of the aorta and its branches. No aneurysmal dilatation. Further vascular evaluation not possible without intravenous contrast.  Foley within the decompressed bladder.  Calcified vas deferens, suggests underlying diabetes.  No acute osseous finding.  Surgical clips right groin.  IMPRESSION: Circumferential thickening of distal small bowel and proximal colon. In keeping with an enterocolitis.  Ischemia not excluded.  Discussed  via telephone with Dr. Melonie Florida at 10:30 p.m. on 07/04/2012.   Original Report Authenticated By: Carlos Levering, M.D.    Dg Chest 2 View  07/03/2012  *RADIOLOGY REPORT*  Clinical Data: Fever and nausea  CHEST - 2 VIEW  Comparison: June 17, 2012  Findings: The lungs are clear.  The heart size and pulmonary vascularity are within normal limits.  No adenopathy.  There is degenerative change in the thoracic spine.  IMPRESSION: No edema or consolidation.   Original Report Authenticated By: Lowella Grip, M.D.    Dg Chest Port 1 View  07/05/2012  *RADIOLOGY REPORT*  Clinical Data: Evaluate endotracheal tube positioning  PORTABLE CHEST - 1 VIEW  Comparison: 07/04/2012; 07/03/2012; 06/17/2012  Findings: Grossly unchanged enlarged cardiac silhouette and mediastinal contours with apparent differences likely attributable to the decreased lung volumes and AP projection.  Stable position of support apparatus.  No pneumothorax.  Pulmonary vasculature is indistinct.  Grossly unchanged perihilar and bibasilar opacities, left greater than right.  Trace left-sided effusion is not excluded.  Unchanged bones.  IMPRESSION: 1.  Stable positioning of support apparatus.  No pneumothorax. 2.  Suspected mild pulmonary edema with possible trace left-sided effusion. 3.  Persistently reduced lung volumes with unchanged perihilar and bibasilar opacities, left greater than right, favored to represent atelectasis.   Original Report Authenticated By: Jake Seats, MD    Dg Chest Portable 1 View  07/04/2012  *RADIOLOGY REPORT*  Clinical Data: Central line placement and intubation.  Sepsis.  PORTABLE CHEST - 1 VIEW  Comparison: Chest 07/04/2012.  Findings: The patient has a new left IJ approach central venous catheter with the tip projecting over the lower superior vena cava. Endotracheal tube is in  place with the tip in good position, 2 cm above the carina.  There is no pneumothorax.  Cardiomegaly without edema is noted.   IMPRESSION:  1.  ET tube and left IJ catheter in good position.  No pneumothorax or acute abnormality. 2.  Cardiomegaly.   Original Report Authenticated By: Orlean Patten, M.D.    Dg Chest Port 1 View  07/04/2012  *RADIOLOGY REPORT*  Clinical Data: Short of breath  PORTABLE CHEST - 1 VIEW  Comparison: Chest radiograph 07/03/2012  Findings: Stable mildly enlarged heart silhouette.  No effusion, infiltrate, or pneumothorax.  Lung volumes.  IMPRESSION: Borderline enlarged heart silhouette.  No acute findings.   Original Report Authenticated By: Suzy Bouchard, M.D.    Dg Abd Portable 1v  07/05/2012  *RADIOLOGY REPORT*  Clinical Data: Abdominal distension  PORTABLE ABDOMEN - 1 VIEW  Comparison: CT abdomen pelvis - 07/04/2012  Findings:  An enteric tube tip and side port overlie the expected location of the gastric fundus.  There is a minimal amount of air within the gastric antrum. Otherwise, there is a paucity of bowel gas precluding evaluation for obstruction.  Evaluation of pneumoperitoneum is degraded secondary to supine positioning exclusion of the lower thorax.  No definite pneumatosis or portal venous gas.  There is a small amount of enteric contrast within the rectum.  Suspected radiolucent Foley catheter overlies the pelvis.  No acute osseous abnormalities.  IMPRESSION: 1.  Paucity of bowel gas, precluding evaluation for obstruction. 2.  Enteric tube tip and side port project over the expected location of the gastric fundus.   Original Report Authenticated By: Jake Seats, MD    Dg Abd Portable 2v  07/04/2012  *RADIOLOGY REPORT*  Clinical Data: Abdominal distention and pain.  PORTABLE ABDOMEN - 2 VIEW  Comparison: None.  Findings: Image quality is degraded by body habitus.  Stomach is air-filled and markedly distended.  There appears to be small bowel distention as well with associated air fluid levels.  The lower abdomen is not included on the images. Numerous high density structures overlying  the right abdomen on the left lateral decubitus view are likely artifactual, given their apparent absence on the supine view.  IMPRESSION: Image quality is degraded by body habitus.  Findings are suspicious for a high-grade small bowel obstruction.   Original Report Authenticated By: Lorin Picket, M.D.       Medications:    Infusions: . sodium chloride 100 mL/hr at 07/05/12 0400  . dextrose    . fentaNYL infusion INTRAVENOUS 50 mcg/hr (07/05/12 0544)  . insulin pump    . insulin (NOVOLIN-R) infusion Stopped (07/05/12 0701)  . midazolam (VERSED) infusion 1 mg/hr (07/05/12 0641)    Scheduled Medications: . antiseptic oral rinse  15 mL Mouth Rinse QID  . aspirin  81 mg Oral Daily  . ceFEPime (MAXIPIME) IV  1 g Intravenous Q24H  . chlorhexidine  15 mL Mouth Rinse BID  . cycloSPORINE  175 mg Oral BID  . heparin  5,000 Units Subcutaneous Q8H  . insulin aspart  1-3 Units Subcutaneous Q4H  . insulin glargine  10 Units Subcutaneous Q24H  . levothyroxine  88 mcg Oral QAC breakfast  . metoCLOPramide  5 mg Oral TID PC & HS  . metronidazole  500 mg Intravenous Q8H  . oseltamivir  75 mg Oral Daily  . potassium chloride  10 mEq Intravenous Q1 Hr x 6  . predniSONE  5 mg Oral Q breakfast  . sodium chloride  3 mL Intravenous  Q12H  . sodium chloride  3 mL Intravenous Q12H    PRN Medications: sodium chloride, sodium chloride, sodium chloride, acetaminophen, acetaminophen, dextrose, dextrose, feeding supplement (NEPRO CARB STEADY), fentaNYL, heparin, HYDROmorphone, lidocaine (PF), lidocaine-prilocaine, midazolam, nitroGLYCERIN, pentafluoroprop-tetrafluoroeth, promethazine, sodium chloride, sodium chloride   Assessment/ Plan:    Patient is a 47 y.o. male with a PMHx of s/p cadaveric kidney transplant with subsequent failure after 18 years, ESRD with HD on 07/2011, HTN, DM on insulin pump, AOC, s/p BKA who was admitted to [facility] on 07/03/2012 for evaluation of myalgias and more than 20 bowel movements  of watery non bloody diarrhea x 1 day. Renal is consulted for A/C RF.   1. A/C RF, baseline Cr 5.39, BUN 98 and GFR 12  The etiologies include volume depletion 2/2 decreased oral intake and diarrhea in the setting of diuretic Lasix and immunosuppressant medication.   - urgent HD done 3/12 due to acute uremia - Flu PCR pending  - hold phoslo in acute N/V/D  - consider HD today  2. Diarrhea--AGE  - C-Diff negative  - IV Flagyl and cefepime  3.AG Gap metabolic acidosis  - 2/2 GI loss of HCO3. ?Ischemic Bowel and sepsis  4. Hyponatremia--associated with RF  5. sepsis--per primary care team  - Blood culture pending  6. Anemia of chronic disease--HH stable on outpatient EPO  7. Pyuria  - Urine culture pending  - already started on ABX for possible sepsis   8. Hypokalemia and hypomagnesemia - replaced by PCCM - Mg 2 g IV x 1 ordered.   DVT PPX - recs Heparin--defer to primary team    Length of Stay: 2 days  Patient history and plan of care reviewed with attending, Dr. Posey Pronto.   Charlann Lange, MD  PGYII, Internal Medicine Resident 07/05/2012, 8:47 AM

## 2012-07-05 NOTE — Progress Notes (Signed)
Dr. Florene Glen called and informed of pt being hypertensive during HD Tx. Goal initially was to keep even. Dr, Florene Glen states to attempt to pull 1L of fluid if pt tolerates.

## 2012-07-05 NOTE — Progress Notes (Signed)
eLink Physician-Brief Progress Note Patient Name: Dale [name] DOB: 06-Feb-1966 MRN: [id number]  Date of Service  07/05/2012   HPI/Events of Note  Temp of 102.8.  Has NGT in place with CT scan showing an enterocolitis.  Has flexiseal in place for ongoing diarrhea.   eICU Interventions  Plan: 1 gm of IV tylenol for fever   Intervention Category Minor Interventions: Routine modifications to care plan (e.g. PRN medications for pain, fever)  Kyara Boxer 07/05/2012, 12:18 AM

## 2012-07-06 ENCOUNTER — Inpatient Hospital Stay (HOSPITAL_COMMUNITY): Payer: Medicare Other

## 2012-07-06 LAB — BLOOD GAS, ARTERIAL
Acid-base deficit: 4.3 mmol/L — ABNORMAL HIGH (ref 0.0–2.0)
Bicarbonate: 19.9 mEq/L — ABNORMAL LOW (ref 20.0–24.0)
Drawn by: 332341
FIO2: 0.4 %
MECHVT: 550 mL
O2 Saturation: 98.3 %
PEEP: 5 cmH2O
Patient temperature: 98.6
RATE: 18 resp/min
TCO2: 20.9 mmol/L (ref 0–100)
pCO2 arterial: 33.9 mmHg — ABNORMAL LOW (ref 35.0–45.0)
pH, Arterial: 7.386 (ref 7.350–7.450)
pO2, Arterial: 160 mmHg — ABNORMAL HIGH (ref 80.0–100.0)

## 2012-07-06 LAB — RENAL FUNCTION PANEL
Albumin: 2 g/dL — ABNORMAL LOW (ref 3.5–5.2)
BUN: 55 mg/dL — ABNORMAL HIGH (ref 6–23)
CO2: 21 mEq/L (ref 19–32)
Calcium: 7.6 mg/dL — ABNORMAL LOW (ref 8.4–10.5)
Chloride: 98 mEq/L (ref 96–112)
Creatinine, Ser: 5.63 mg/dL — ABNORMAL HIGH (ref 0.50–1.35)
GFR calc Af Amer: 13 mL/min — ABNORMAL LOW (ref >90)
GFR calc non Af Amer: 11 mL/min — ABNORMAL LOW (ref >90)
Glucose, Bld: 268 mg/dL — ABNORMAL HIGH (ref 70–99)
Phosphorus: 5.5 mg/dL — ABNORMAL HIGH (ref 2.3–4.6)
Potassium: 4.3 mEq/L (ref 3.5–5.1)
Sodium: 137 mEq/L (ref 135–145)

## 2012-07-06 LAB — CBC
HCT: 28.3 % — ABNORMAL LOW (ref 39.0–52.0)
Hemoglobin: 9.4 g/dL — ABNORMAL LOW (ref 13.0–17.0)
MCH: 31.8 pg (ref 26.0–34.0)
MCHC: 33.2 g/dL (ref 30.0–36.0)
MCV: 95.6 fL (ref 78.0–100.0)
Platelets: DECREASED 10*3/uL (ref 150–400)
RBC: 2.96 MIL/uL — ABNORMAL LOW (ref 4.22–5.81)
RDW: 16.1 % — ABNORMAL HIGH (ref 11.5–15.5)
WBC: 7.6 10*3/uL (ref 4.0–10.5)

## 2012-07-06 LAB — PROCALCITONIN: Procalcitonin: >175 ng/mL

## 2012-07-06 LAB — VANCOMYCIN, RANDOM: Vancomycin Rm: 27.6 ug/mL

## 2012-07-06 LAB — GLUCOSE, CAPILLARY
Glucose-Capillary: 198 mg/dL — ABNORMAL HIGH (ref 70–99)
Glucose-Capillary: 206 mg/dL — ABNORMAL HIGH (ref 70–99)
Glucose-Capillary: 233 mg/dL — ABNORMAL HIGH (ref 70–99)
Glucose-Capillary: 236 mg/dL — ABNORMAL HIGH (ref 70–99)
Glucose-Capillary: 242 mg/dL — ABNORMAL HIGH (ref 70–99)
Glucose-Capillary: 272 mg/dL — ABNORMAL HIGH (ref 70–99)

## 2012-07-06 LAB — POCT I-STAT 3, ART BLOOD GAS (G3+)
Acid-base deficit: 2 mmol/L (ref 0.0–2.0)
Bicarbonate: 22.1 mEq/L (ref 20.0–24.0)
O2 Saturation: 99 %
Patient temperature: 98.3
TCO2: 23 mmol/L (ref 0–100)
pCO2 arterial: 34.6 mmHg — ABNORMAL LOW (ref 35.0–45.0)
pH, Arterial: 7.412 (ref 7.350–7.450)
pO2, Arterial: 154 mmHg — ABNORMAL HIGH (ref 80.0–100.0)

## 2012-07-06 LAB — HEMOGLOBIN AND HEMATOCRIT, BLOOD
HCT: 29.7 % — ABNORMAL LOW (ref 39.0–52.0)
HCT: 33.4 % — ABNORMAL LOW (ref 39.0–52.0)
Hemoglobin: 9.9 g/dL — ABNORMAL LOW (ref 13.0–17.0)
Hemoglobin: 11.3 g/dL — ABNORMAL LOW (ref 13.0–17.0)

## 2012-07-06 LAB — MAGNESIUM: Magnesium: 2.3 mg/dL (ref 1.5–2.5)

## 2012-07-06 MED ORDER — SODIUM CHLORIDE 0.9 % IV SOLN
INTRAVENOUS | Status: DC
  Administered 2012-07-06: 1.8 [IU]/h via INTRAVENOUS
  Filled 2012-07-06: qty 1

## 2012-07-06 MED ORDER — SODIUM CHLORIDE 0.9 % IV SOLN
INTRAVENOUS | Status: DC
  Administered 2012-07-06 – 2012-07-08 (×2): via INTRAVENOUS

## 2012-07-06 MED ORDER — HYDROMORPHONE HCL PF 1 MG/ML IJ SOLN
1.0000 mg | INTRAMUSCULAR | Status: DC | PRN
  Administered 2012-07-07 – 2012-07-08 (×12): 1 mg via INTRAVENOUS
  Filled 2012-07-06 (×11): qty 1

## 2012-07-06 MED ORDER — HYDRALAZINE HCL 20 MG/ML IJ SOLN
5.0000 mg | INTRAMUSCULAR | Status: DC | PRN
  Administered 2012-07-06 – 2012-07-08 (×2): 5 mg via INTRAVENOUS
  Filled 2012-07-06 (×2): qty 1
  Filled 2012-07-06: qty 0.25
  Filled 2012-07-06: qty 1

## 2012-07-06 NOTE — Progress Notes (Signed)
PULMONARY  / CRITICAL CARE MEDICINE  Name: Dale [name] MRN: [id number] DOB: [birth year]    ADMISSION DATE:  07/03/2012 CONSULTATION DATE:  07/04/12  REFERRING MD :  TRH  CHIEF COMPLAINT:  Respiratory failure and septic shock.  BRIEF PATIENT DESCRIPTION: 47 year old male Type 1 diabetic with HTN and ESRD s/p transplant with baseline cr of 5 that now increased to 7 with abdominal distension and severe diarrhea.  PCCM was called on 3/12 due to increased work of breathing, hypotension, increased abdominal distension and feculent NGT aspirate.  SIGNIFICANT EVENTS / STUDIES:  3/12 VDRF and septic shock. 3/12 CT abd/pelvis >Circumferential thickening of distal small bowel and proximal colon. In keeping with an enterocolitis. Ischemia not excluded.  LINES / TUBES: L IJ TLC 3/12>>> ET tube 3/12>>> Foley 3/12>>> NGT 3/12>>>  CULTURES: Blood 3/12>>> Urine 3/12>>>neg  Sputum 3/12>>> C. diff 3/12>>>neg  C diff 3/13 >> neg Influz  3/11 >>neg  3/13 CMV >> 3/14 C. Diff >>  ANTIBIOTICS: Vanc 3/12 >>> Vanc PO 3/13 >>  Flagyl 3/11>>> Cefepime 3/11>>> Tamiflu 3/10>> (stop 3/15)  SUBJECTIVE:  Tolerating PSV well  VITAL SIGNS: Temp:  [97.8 F (36.6 C)-98.9 F (37.2 C)] 98 F (36.7 C) (03/14 0807) Pulse Rate:  [76-106] 87 (03/14 0700) Resp:  [9-20] 17 (03/14 0700) BP: (113-185)/(50-78) 134/66 mmHg (03/14 0700) SpO2:  [100 %] 100 % (03/14 0700) Arterial Line BP: (117-179)/(49-71) 161/66 mmHg (03/14 0700) FiO2 (%):  [40 %] 40 % (03/14 0740) Weight:  [120.9 kg (266 lb 8.6 oz)-122.6 kg (270 lb 4.5 oz)] 120.9 kg (266 lb 8.6 oz) (03/14 0500) HEMODYNAMICS: CVP:  [5 mmHg-11 mmHg] 10 mmHg VENTILATOR SETTINGS: Vent Mode:  [-] PSV;CPAP FiO2 (%):  [40 %] 40 % Set Rate:  [18 bmp] 18 bmp Vt Set:  [550 mL] 550 mL PEEP:  [5 cmH20] 5 cmH20 Pressure Support:  [5 cmH20] 5 cmH20 Plateau Pressure:  [14 cmH20-20 cmH20] 16 cmH20 INTAKE / OUTPUT: Intake/Output     03/13 0701 - 03/14 0700 03/14 0701 -  03/15 0700   I.V. (mL/kg) 2014 (16.7)    Other 400    NG/GT 220    IV Piggyback 650    Total Intake(mL/kg) 3284 (27.2)    Urine (mL/kg/hr) 268 (0.1)    Emesis/NG output 800 (0.3)    Other 1164 (0.4)    Stool 50 (0)    Total Output 2282     Net +1002           PHYSICAL EXAMINATION: General:  Chronically ill appearing male Neuro:  awake, follows commands, moves all ext HEENT:  ETT  Cardiovascular:  Tachy but regular, Nl S1/S2, -M/R/G. Lungs:  Diminshed BS in bases  Abdomen:  Soft, diffusely tender, distended with hypoactive bowel sounds. Musculoskeletal:  L BKA, otherwise normal. Skin:  Bruising but otherwise intact.  LABS:  Recent Labs Lab 07/03/12 1323 07/03/12 1415  07/04/12 0635  07/04/12 1013 07/04/12 1026 07/04/12 1225  07/05/12 0355 07/05/12 0435  07/05/12 1821 07/06/12 0257 07/06/12 0416 07/06/12 0419 07/06/12 0447 07/06/12 0830  HGB 11.4*  --   < > 11.2*  --   --  10.4*  --   < > 9.8*  --   < > 9.0* 9.9* 9.4*  --   --   --   WBC 18.4*  --   < > 10.6*  --   --  9.3  --   --  6.4  --   --   --   --  7.6  --   --   --   PLT 149*  --   < > 122*  --   --  PLATELET CLUMPS NOTED ON SMEAR, COUNT APPEARS DECREASED  --   --  PLATELET CLUMPS NOTED ON SMEAR, UNABLE TO ESTIMATE  --   --   --   --  PLATELET CLUMPS NOTED ON SMEAR, COUNT APPEARS DECREASED  --   --   --   NA 132*  --   --  137  --   --  137  --   --  138  --   --  134*  --   --   --   --  137  K 3.8  --   --  3.1*  --   --  2.9*  --   --  2.7*  --   --  4.3  --   --   --   --  4.3  CL 96  --   --  100  --   --  102  --   --  101  --   --  98  --   --   --   --  98  CO2 18*  --   --  14*  --   --  17*  --   --  19  --   --  16*  --   --   --   --  21  GLUCOSE 188*  --   --  125*  --   --  90  --   --  115*  --   --  328*  --   --   --   --  268*  BUN 110*  --   --  114*  --   --  98*  --   --  81*  --   --  87*  --   --   --   --  55*  CREATININE 6.60*  --   < > 7.40*  --   --  6.62*  --   --  7.87*  --   --   8.52*  --   --   --   --  5.63*  CALCIUM 8.9  --   --  8.3*  --   --  8.0*  --   --  7.7*  --   --  7.4*  --   --   --   --  7.6*  MG  --   --   --   --   --   --   --   --   --  1.6  --   --   --   --   --   --   --   --   PHOS  --   --   --  3.6  --   --   --   --   --  4.4  --   --   --   --   --   --   --  5.5*  AST 19  --   --  42*  --   --  57*  --   --   --   --   --   --   --   --   --   --   --   ALT 10  --   --  12  --   --  13  --   --   --   --   --   --   --   --   --   --   --   ALKPHOS 67  --   --  59  --   --  51  --   --   --   --   --   --   --   --   --   --   --   BILITOT 0.4  --   --  0.4  --   --  0.4  --   --   --   --   --   --   --   --   --   --   --   PROT 7.3  --   --  6.9  --   --  6.5  --   --   --   --   --   --   --   --   --   --   --   ALBUMIN 2.3*  --   --  2.2*  --   --  2.0*  --   --   --   --   --   --   --   --   --   --  2.0*  APTT  --   --   --   --   --   --  44*  --   --   --   --   --   --   --   --   --   --   --   INR  --   --   --   --   --   --  1.63*  --   --   --   --   --   --   --   --   --   --   --   LATICACIDVEN  --  2.73*  --   --   --  0.9  --   --   --   --   --   --   --   --   --   --   --   --   TROPONINI  --   --   --   --   --   --   --  0.60*  --   --   --   --   --   --   --   --   --   --   PROCALCITON  --   --   --   --   --  >175.00  --   --   --   --   --   --   --   --  >175.00  --   --   --   PHART  --   --   --   --   < >  --   --  7.431  --   --  7.434  --   --   --   --  7.412 7.386  --   PCO2ART  --   --   --   --   < >  --   --  30.4*  --   --  30.0*  --   --   --   --  34.6* 33.9*  --   PO2ART  --   --   --   --   < >  --   --  399.0*  --   --  173.0*  --   --   --   --  154.0* 160.0*  --   < > = values in this interval not displayed.  Recent Labs Lab 07/05/12 1634 07/05/12 1957 07/06/12 0001 07/06/12 0354 07/06/12 0806  GLUCAP 272* 265* 198* 206* 242*    CXR: Suspected mild pulmonary edema with possible trace  left-sided effusion. . Persistently reduced lung volumes with unchanged perihilar and  bibasilar opacities, left greater than right, favored to represent atelectasis.   ASSESSMENT / PLAN:  PULMONARY A: VDRF likely due to abdominal distension and infection  P:   - Continue vent support  > goal extubate 3/14 - F/U CXR   CARDIOVASCULAR A: Septic shock, likely abdominal source. 3/13 : b/p improved with fluid resus. No pressors needed  P:   - Hold anti-HTN, was on coreg so will need to watch for tachy without beta blockers. - Continue ASA.   RENAL A:  Acute on chronic renal failure. Hypokalemia  Chronic immunosuppression with cyclosporin P:   - Patient has a fistula,  HD per renal plans - follow electrolytes and replace as indicated.   GASTROINTESTINAL A:  Intrabdominal infection likely, c. Diff negative x2 (PCR).  Consider also transient ischemic injury CT abd/pelvis 3/12 >Circumferential thickening of distal small bowel and proximal colon.  P:   - NGT to suction. - Surgery following > no indication for OR at this time - empiric rx for C diff, consider a truncated course since PCR negative x 2 but clinical suspicion high  HEMATOLOGIC A:  Leukocytosis from infection on presentation, then leukopenia.  H&H stable P:  - Follow H&H. - Stool OB.  INFECTIOUS  Recent Labs Lab 07/04/12 1013 07/06/12 0416  PROCALCITON >175.00 >175.00  A:  Likely abdominal source but covering broadly. Entire clinical picture most worrisome for C diff although testing negative.  P:   - Follow cx data . - Cefepime/vanc/flagyl and tamiflu ordered - added enteral vanco 3/13 given concern for C diff even though PCR negative; likely will complete a truncated course given high suspicion  - Finish course of tamiflu since already started. - will add empiric anti-fungals if he spikes temp or decompensates given immunosuppressed state   ENDOCRINE A:  DM.  -type 1  Chronic pred use and presumed  adrenal insufficiency P:   - ISS. - d/c'd lantus with worsening renal fxn - converted pred to hydrocort stress dose 3/13  NEUROLOGIC A:  Sedated  P:   - intermittent sedation  I have personally obtained a history, examined the patient, evaluated laboratory and imaging results, formulated the assessment and plan and placed orders.  CRITICAL CARE: The patient is critically ill with multiple organ systems failure and requires high complexity decision making for assessment and support, frequent evaluation and titration of therapies, application of advanced monitoring technologies and extensive interpretation of multiple databases. Critical Care Time devoted to patient care services described in this note is 40 minutes.    Baltazar Apo, MD, PhD 07/06/2012, 11:13 AM  Pulmonary and Critical Care [phone] or if no answer [phone]

## 2012-07-06 NOTE — Progress Notes (Signed)
Patient ID: Dale [name], male   DOB: 1965/12/07, 47 y.o.   MRN: [id number]   [facility] Progress Note    Subjective:   Problems noted with fistula cannulation yesterday impeding dialysis-appreciate assistance from vascular surgery in evaluation.    Objective:   BP 134/66  Pulse 87  Temp(Src) 98.3 F (36.8 C) (Oral)  Resp 17  Ht 5\' 8"  (1.727 m)  Wt 120.9 kg (266 lb 8.6 oz)  BMI 40.54 kg/m2  SpO2 100%  Intake/Output Summary (Last 24 hours) at 07/06/12 0759 Last data filed at 07/06/12 0700  Gross per 24 hour  Intake   3284 ml  Output   2282 ml  Net   1002 ml   Weight change: 2.2 kg (4 lb 13.6 oz)  Physical Exam: Gen: Intubated, sedated, awakens on voice/touch CVS: Pulse regular in rate and rhythm, heart sounds S1 and S2 normal Resp: Transmitted breath sounds bilaterally-on ventilator and being prepared for pressure support Abd: Soft, obese, nontender and bowel sounds are normal Ext: Left lower extremity status post BKA, right lower extremity with trace edema and pretibial hyperpigmentation  Imaging: Ct Abdomen Pelvis Wo Contrast  07/04/2012  *RADIOLOGY REPORT*  Clinical Data: Abdominal distension, pain.  CT ABDOMEN AND PELVIS WITHOUT CONTRAST  Technique:  Multidetector CT imaging of the abdomen and pelvis was performed following the standard protocol without intravenous contrast.  Comparison: 07/04/2012 radiograph, 09/02/2011 CT  Findings: Bibasilar opacities.  NG tube descends into the stomach. Coronary artery calcification.  Trace pericardial fluid versus thickening.  There is extensive streak artifact from patient contact with the gantry.  In addition, images are degraded by the lack of intravenous contrast.  Within these limitations, no definitive acute abnormality identified involving the liver, spleen, biliary system, pancreas, or adrenal glands.  Atrophic native kidneys. Transplant in the left lower quadrant.  No hydronephrosis or hydroureter.  Rectal tube in  place.  Mild colonic diverticulosis.  The ileal loops and proximal colon (to the level of the mid transverse colon) show circumferential wall thickening.  No bowel wall pneumatosis. No free intraperitoneal air.  Small amount of free fluid within the right paracolic gutter and pelvis.  No lymphadenopathy.  There is advanced atherosclerotic calcification of the aorta and its branches. No aneurysmal dilatation. Further vascular evaluation not possible without intravenous contrast.  Foley within the decompressed bladder.  Calcified vas deferens, suggests underlying diabetes.  No acute osseous finding.  Surgical clips right groin.  IMPRESSION: Circumferential thickening of distal small bowel and proximal colon. In keeping with an enterocolitis.  Ischemia not excluded.  Discussed via telephone with Dr. Melonie Florida at 10:30 p.m. on 07/04/2012.   Original Report Authenticated By: Carlos Levering, M.D.    Dg Chest Port 1 View  07/06/2012  *RADIOLOGY REPORT*  Clinical Data: Endotracheal tube assessment.  Diabetes. Respiratory failure.  PORTABLE CHEST - 1 VIEW  Comparison: 07/05/2012  Findings: Endotracheal tube tip is 2.8 cm above the carina.  Left central line tip projects over the SVC.  Nasogastric tube extends into the stomach.  Low lung volumes noted with cardiomegaly.  Linear subsegmental atelectasis observed in the lingula.  Indistinct pulmonary vasculature noted.  IMPRESSION:  1.  ET tube tip 2.8 cm above carina. 2.  Cardiomegaly with indistinct pulmonary vasculature favoring pulmonary venous hypertension. 3.  Subsegmental atelectasis in the lingula.   Original Report Authenticated By: Van Clines, M.D.    Dg Chest Port 1 View  07/05/2012  *RADIOLOGY REPORT*  Clinical Data: Evaluate endotracheal tube positioning  PORTABLE CHEST - 1 VIEW  Comparison: 07/04/2012; 07/03/2012; 06/17/2012  Findings: Grossly unchanged enlarged cardiac silhouette and mediastinal contours with apparent differences likely  attributable to the decreased lung volumes and AP projection.  Stable position of support apparatus.  No pneumothorax.  Pulmonary vasculature is indistinct.  Grossly unchanged perihilar and bibasilar opacities, left greater than right.  Trace left-sided effusion is not excluded.  Unchanged bones.  IMPRESSION: 1.  Stable positioning of support apparatus.  No pneumothorax. 2.  Suspected mild pulmonary edema with possible trace left-sided effusion. 3.  Persistently reduced lung volumes with unchanged perihilar and bibasilar opacities, left greater than right, favored to represent atelectasis.   Original Report Authenticated By: Jake Seats, MD    Dg Chest Portable 1 View  07/04/2012  *RADIOLOGY REPORT*  Clinical Data: Central line placement and intubation.  Sepsis.  PORTABLE CHEST - 1 VIEW  Comparison: Chest 07/04/2012.  Findings: The patient has a new left IJ approach central venous catheter with the tip projecting over the lower superior vena cava. Endotracheal tube is in place with the tip in good position, 2 cm above the carina.  There is no pneumothorax.  Cardiomegaly without edema is noted.  IMPRESSION:  1.  ET tube and left IJ catheter in good position.  No pneumothorax or acute abnormality. 2.  Cardiomegaly.   Original Report Authenticated By: Orlean Patten, M.D.    Dg Chest Port 1 View  07/04/2012  *RADIOLOGY REPORT*  Clinical Data: Short of breath  PORTABLE CHEST - 1 VIEW  Comparison: Chest radiograph 07/03/2012  Findings: Stable mildly enlarged heart silhouette.  No effusion, infiltrate, or pneumothorax.  Lung volumes.  IMPRESSION: Borderline enlarged heart silhouette.  No acute findings.   Original Report Authenticated By: Suzy Bouchard, M.D.    Dg Abd Portable 1v  07/05/2012  *RADIOLOGY REPORT*  Clinical Data: Abdominal distension  PORTABLE ABDOMEN - 1 VIEW  Comparison: CT abdomen pelvis - 07/04/2012  Findings:  An enteric tube tip and side port overlie the expected location of the gastric  fundus.  There is a minimal amount of air within the gastric antrum. Otherwise, there is a paucity of bowel gas precluding evaluation for obstruction.  Evaluation of pneumoperitoneum is degraded secondary to supine positioning exclusion of the lower thorax.  No definite pneumatosis or portal venous gas.  There is a small amount of enteric contrast within the rectum.  Suspected radiolucent Foley catheter overlies the pelvis.  No acute osseous abnormalities.  IMPRESSION: 1.  Paucity of bowel gas, precluding evaluation for obstruction. 2.  Enteric tube tip and side port project over the expected location of the gastric fundus.   Original Report Authenticated By: Jake Seats, MD    Dg Abd Portable 2v  07/04/2012  *RADIOLOGY REPORT*  Clinical Data: Abdominal distention and pain.  PORTABLE ABDOMEN - 2 VIEW  Comparison: None.  Findings: Image quality is degraded by body habitus.  Stomach is air-filled and markedly distended.  There appears to be small bowel distention as well with associated air fluid levels.  The lower abdomen is not included on the images. Numerous high density structures overlying the right abdomen on the left lateral decubitus view are likely artifactual, given their apparent absence on the supine view.  IMPRESSION: Image quality is degraded by body habitus.  Findings are suspicious for a high-grade small bowel obstruction.   Original Report Authenticated By: Lorin Picket, M.D.     Labs: BMET  Recent Labs Lab 07/03/12 1323 07/03/12 2014 07/04/12 [id number]  07/04/12 1026 07/05/12 0355 07/05/12 1821  NA 132*  --  137 137 138 134*  K 3.8  --  3.1* 2.9* 2.7* 4.3  CL 96  --  100 102 101 98  CO2 18*  --  14* 17* 19 16*  GLUCOSE 188*  --  125* 90 115* 328*  BUN 110*  --  114* 98* 81* 87*  CREATININE 6.60* 6.89* 7.40* 6.62* 7.87* 8.52*  CALCIUM 8.9  --  8.3* 8.0* 7.7* 7.4*  PHOS  --   --  3.6  --  4.4  --    CBC  Recent Labs Lab 07/03/12 1323  07/04/12 0635 07/04/12 1026   07/05/12 0355 07/05/12 1026 07/05/12 1821 07/06/12 0257 07/06/12 0416  WBC 18.4*  < > 10.6* 9.3  --  6.4  --   --   --  7.6  NEUTROABS 17.0*  --   --   --   --   --   --   --   --   --   HGB 11.4*  < > 11.2* 10.4*  < > 9.8* 9.2* 9.0* 9.9* 9.4*  HCT 34.7*  < > 34.0* 30.9*  < > 29.4* 28.0* 27.8* 29.7* 28.3*  MCV 97.2  < > 97.7 95.4  --  96.4  --   --   --  95.6  PLT 149*  < > 122* PLATELET CLUMPS NOTED ON SMEAR, COUNT APPEARS DECREASED  --  PLATELET CLUMPS NOTED ON SMEAR, UNABLE TO ESTIMATE  --   --   --  PLATELET CLUMPS NOTED ON SMEAR, COUNT APPEARS DECREASED  < > = values in this interval not displayed.  Medications:    . antiseptic oral rinse  15 mL Mouth Rinse QID  . aspirin  81 mg Oral Daily  . ceFEPime (MAXIPIME) IV  1 g Intravenous Q24H  . chlorhexidine  15 mL Mouth Rinse BID  . cycloSPORINE  175 mg Oral BID  . heparin  5,000 Units Subcutaneous Q8H  . hydrocortisone sodium succinate  50 mg Intravenous Q6H  . insulin aspart  0-15 Units Subcutaneous Q4H  . insulin glargine  10 Units Subcutaneous QHS  . levothyroxine  88 mcg Oral QAC breakfast  . metronidazole  500 mg Intravenous Q8H  . oseltamivir  75 mg Oral Daily  . pantoprazole (PROTONIX) IV  40 mg Intravenous Q24H  . sodium chloride  3 mL Intravenous Q12H  . sodium chloride  3 mL Intravenous Q12H  . vancomycin  500 mg Per Tube Q6H     Assessment/ Plan:   1. Acute renal failure chronic kidney disease stage IV/V: Suspect evolution to dialysis dependent ESRD following significant renal injury. We'll continue to provide dialysis support intermittently unless contraindicated by hypotension-labs pending from this morning to decide the need for dialysis today. Able to use arteriovenous fistula yesterday after Doppler guided delineation of fistula tract. No indications for placement of dialysis catheter as yet (would need to be temporary rather than permanent in the face of unidentified infection/sepsis). 2. Respiratory  failure/sepsis: Suspected intra-abdominal pathology-possible ischemic bowel. Ongoing close evaluation by surgery after  CT scan of the abdomen that was erroneously done without contrast failed to add to diagnosis. Remains intubated and currently ongoing breathing trials. Cultures negative to date-remains on contact precautions for C. difficile colitis. Yesterday, we ordered CMV DNA PCR as he is on chronic immunosuppression with cyclosporine. Remains on broad-spectrum antibiotic therapy with Maxipime and vancomycin as well as metronidazole. 3. Anemia: Suspect underlying anemia  of chronic kidney disease now compounded by acute illness, initiate ESA therapy.   Elmarie Shiley, MD 07/06/2012, 7:59 AM

## 2012-07-06 NOTE — Progress Notes (Signed)
Inpatient Diabetes Program Recommendations  AACE/ADA: New Consensus Statement on Inpatient Glycemic Control (2013)  Target Ranges:  Prepandial:   less than 140 mg/dL      Peak postprandial:   less than 180 mg/dL (1-2 hours)      Critically ill patients:  140 - 180 mg/dL   Reason for Visit: Results for WESSON, HOLDERBAUM (MRN [id number]) as of 07/06/2012 15:04  Ref. Range 07/06/2012 03:54 07/06/2012 08:06 07/06/2012 11:57 07/06/2012 13:11  Glucose-Capillary Latest Range: 70-99 mg/dL 206 (H) 242 (H) 233 (H) 236 (H)   Note that patient has history of Type 1 diabetes.  CBG's continue to be greater than goal.  Agree with restart of insulin drip.  Will likely need to be cautious with transition off IV insulin.  Note that patient's insulin pump was providing 33.6 units of basal insulin/24 hours prior to transfer to ICU.

## 2012-07-06 NOTE — Procedures (Signed)
Extubation Procedure Note  Patient Details:   Name: Dale [name] DOB: [birth year] MRN: [id number]   Airway Documentation:  Airway 7.5 mm (Active)  Secured at (cm) 25 cm 07/06/2012  7:46 AM  Measured From Lips 07/06/2012  7:46 AM  Secured Location Right 07/06/2012  7:46 AM  Secured By [employer] 07/06/2012  7:46 AM  Tube Holder Repositioned Yes 07/06/2012  7:46 AM  Cuff Pressure (cm H2O) 26 cm H2O 07/06/2012  3:29 AM  Site Condition Dry 07/06/2012  7:46 AM    Evaluation  O2 sats: stable throughout and currently acceptable Complications: No apparent complications Patient did tolerate procedure well. Bilateral Breath Sounds: Clear;Diminished Suctioning: Airway Yes  Prior to extubation: pt suctioned, positive cuff leak noted.  Post-extubation:  Pt able to cough and produce sputum, state his name very clearly, and no stridor noted.  Miquel Dunn 07/06/2012, 11:55 AM

## 2012-07-06 NOTE — Progress Notes (Signed)
Patient refuses CPAP because he says it makes him claustrophobic. RT will continue to monitor.

## 2012-07-06 NOTE — Progress Notes (Signed)
[facility] successful HD yesterday from [facility].  Available as needed.  Can hold off on access duplex for now while pt is still critically ill.  [name], MD [facility] [place] Office: [phone] Pager: [phone]  07/06/2012, 8:46 AM

## 2012-07-06 NOTE — Progress Notes (Signed)
Subjective: Pt stable no acute changes. Pt with some L sided > R sided abd pain. Con't with min NGT output and con't with liquid stool.  CDiff neg x 2  Objective: Vital signs in last 24 hours: Temp:  [97.8 F (36.6 C)-99 F (37.2 C)] 98.3 F (36.8 C) (03/14 0355) Pulse Rate:  [76-107] 87 (03/14 0700) Resp:  [9-21] 17 (03/14 0700) BP: (113-185)/(47-78) 134/66 mmHg (03/14 0700) SpO2:  [99 %-100 %] 100 % (03/14 0700) Arterial Line BP: (108-179)/(42-71) 161/66 mmHg (03/14 0700) FiO2 (%):  [40 %] 40 % (03/14 0740) Weight:  [266 lb 8.6 oz (120.9 kg)-270 lb 4.5 oz (122.6 kg)] 266 lb 8.6 oz (120.9 kg) (03/14 0500) Last BM Date: 07/05/12  Intake/Output from previous day: 03/13 0701 - 03/14 0700 In: 3284 [I.V.:2014; NG/GT:220; IV Piggyback:650] Out: 2282 [Urine:268; Emesis/NG output:800; Stool:50] Intake/Output this shift:    General appearance: alert and cooperative GI: soft, min ttp L > R, min dist, active BS  Lab Results:   Recent Labs  07/05/12 0355  07/06/12 0257 07/06/12 0416  WBC 6.4  --   --  7.6  HGB 9.8*  < > 9.9* 9.4*  HCT 29.4*  < > 29.7* 28.3*  PLT PLATELET CLUMPS NOTED ON SMEAR, UNABLE TO ESTIMATE  --   --  PLATELET CLUMPS NOTED ON SMEAR, COUNT APPEARS DECREASED  < > = values in this interval not displayed. BMET  Recent Labs  07/05/12 0355 07/05/12 1821  NA 138 134*  K 2.7* 4.3  CL 101 98  CO2 19 16*  GLUCOSE 115* 328*  BUN 81* 87*  CREATININE 7.87* 8.52*  CALCIUM 7.7* 7.4*   PT/INR  Recent Labs  07/04/12 1026  LABPROT 18.8*  INR 1.63*   ABG  Recent Labs  07/06/12 0419 07/06/12 0447  PHART 7.412 7.386  HCO3 22.1 19.9*    Studies/Results: Ct Abdomen Pelvis Wo Contrast  07/04/2012  *RADIOLOGY REPORT*  Clinical Data: Abdominal distension, pain.  CT ABDOMEN AND PELVIS WITHOUT CONTRAST  Technique:  Multidetector CT imaging of the abdomen and pelvis was performed following the standard protocol without intravenous contrast.  Comparison:  07/04/2012 radiograph, 09/02/2011 CT  Findings: Bibasilar opacities.  NG tube descends into the stomach. Coronary artery calcification.  Trace pericardial fluid versus thickening.  There is extensive streak artifact from patient contact with the gantry.  In addition, images are degraded by the lack of intravenous contrast.  Within these limitations, no definitive acute abnormality identified involving the liver, spleen, biliary system, pancreas, or adrenal glands.  Atrophic native kidneys. Transplant in the left lower quadrant.  No hydronephrosis or hydroureter.  Rectal tube in place.  Mild colonic diverticulosis.  The ileal loops and proximal colon (to the level of the mid transverse colon) show circumferential wall thickening.  No bowel wall pneumatosis. No free intraperitoneal air.  Small amount of free fluid within the right paracolic gutter and pelvis.  No lymphadenopathy.  There is advanced atherosclerotic calcification of the aorta and its branches. No aneurysmal dilatation. Further vascular evaluation not possible without intravenous contrast.  Foley within the decompressed bladder.  Calcified vas deferens, suggests underlying diabetes.  No acute osseous finding.  Surgical clips right groin.  IMPRESSION: Circumferential thickening of distal small bowel and proximal colon. In keeping with an enterocolitis.  Ischemia not excluded.  Discussed via telephone with Dr. Melonie Florida at 10:30 p.m. on 07/04/2012.   Original Report Authenticated By: Carlos Levering, M.D.    Dg Chest [facility]  07/06/2012  *RADIOLOGY REPORT*  Clinical Data: Endotracheal tube assessment.  Diabetes. Respiratory failure.  PORTABLE CHEST - 1 VIEW  Comparison: 07/05/2012  Findings: Endotracheal tube tip is 2.8 cm above the carina.  Left central line tip projects over the SVC.  Nasogastric tube extends into the stomach.  Low lung volumes noted with cardiomegaly.  Linear subsegmental atelectasis observed in the lingula.  Indistinct  pulmonary vasculature noted.  IMPRESSION:  1.  ET tube tip 2.8 cm above carina. 2.  Cardiomegaly with indistinct pulmonary vasculature favoring pulmonary venous hypertension. 3.  Subsegmental atelectasis in the lingula.   Original Report Authenticated By: Van Clines, M.D.    Dg Chest Port 1 View  07/05/2012  *RADIOLOGY REPORT*  Clinical Data: Evaluate endotracheal tube positioning  PORTABLE CHEST - 1 VIEW  Comparison: 07/04/2012; 07/03/2012; 06/17/2012  Findings: Grossly unchanged enlarged cardiac silhouette and mediastinal contours with apparent differences likely attributable to the decreased lung volumes and AP projection.  Stable position of support apparatus.  No pneumothorax.  Pulmonary vasculature is indistinct.  Grossly unchanged perihilar and bibasilar opacities, left greater than right.  Trace left-sided effusion is not excluded.  Unchanged bones.  IMPRESSION: 1.  Stable positioning of support apparatus.  No pneumothorax. 2.  Suspected mild pulmonary edema with possible trace left-sided effusion. 3.  Persistently reduced lung volumes with unchanged perihilar and bibasilar opacities, left greater than right, favored to represent atelectasis.   Original Report Authenticated By: Jake Seats, MD    Dg Chest Portable 1 View  07/04/2012  *RADIOLOGY REPORT*  Clinical Data: Central line placement and intubation.  Sepsis.  PORTABLE CHEST - 1 VIEW  Comparison: Chest 07/04/2012.  Findings: The patient has a new left IJ approach central venous catheter with the tip projecting over the lower superior vena cava. Endotracheal tube is in place with the tip in good position, 2 cm above the carina.  There is no pneumothorax.  Cardiomegaly without edema is noted.  IMPRESSION:  1.  ET tube and left IJ catheter in good position.  No pneumothorax or acute abnormality. 2.  Cardiomegaly.   Original Report Authenticated By: Orlean Patten, M.D.    Dg Chest Port 1 View  07/04/2012  *RADIOLOGY REPORT*  Clinical  Data: Short of breath  PORTABLE CHEST - 1 VIEW  Comparison: Chest radiograph 07/03/2012  Findings: Stable mildly enlarged heart silhouette.  No effusion, infiltrate, or pneumothorax.  Lung volumes.  IMPRESSION: Borderline enlarged heart silhouette.  No acute findings.   Original Report Authenticated By: Suzy Bouchard, M.D.    Dg Abd Portable 1v  07/05/2012  *RADIOLOGY REPORT*  Clinical Data: Abdominal distension  PORTABLE ABDOMEN - 1 VIEW  Comparison: CT abdomen pelvis - 07/04/2012  Findings:  An enteric tube tip and side port overlie the expected location of the gastric fundus.  There is a minimal amount of air within the gastric antrum. Otherwise, there is a paucity of bowel gas precluding evaluation for obstruction.  Evaluation of pneumoperitoneum is degraded secondary to supine positioning exclusion of the lower thorax.  No definite pneumatosis or portal venous gas.  There is a small amount of enteric contrast within the rectum.  Suspected radiolucent Foley catheter overlies the pelvis.  No acute osseous abnormalities.  IMPRESSION: 1.  Paucity of bowel gas, precluding evaluation for obstruction. 2.  Enteric tube tip and side port project over the expected location of the gastric fundus.   Original Report Authenticated By: Jake Seats, MD    Dg Abd Portable 2v  07/04/2012  *RADIOLOGY REPORT*  Clinical Data: Abdominal distention and pain.  PORTABLE ABDOMEN - 2 VIEW  Comparison: None.  Findings: Image quality is degraded by body habitus.  Stomach is air-filled and markedly distended.  There appears to be small bowel distention as well with associated air fluid levels.  The lower abdomen is not included on the images. Numerous high density structures overlying the right abdomen on the left lateral decubitus view are likely artifactual, given their apparent absence on the supine view.  IMPRESSION: Image quality is degraded by body habitus.  Findings are suspicious for a high-grade small bowel obstruction.    Original Report Authenticated By: Lorin Picket, M.D.     Anti-infectives: Anti-infectives   Start     Dose/Rate Route Frequency Ordered Stop   07/05/12 2100  vancomycin (VANCOCIN) 750 mg in sodium chloride 0.9 % 150 mL IVPB     750 mg 150 mL/hr over 60 Minutes Intravenous  Once 07/05/12 2029 07/05/12 2200   07/05/12 1800  vancomycin (VANCOCIN) 50 mg/mL oral solution 500 mg     500 mg Per Tube 4 times per day 07/05/12 1511     07/05/12 1600  vancomycin (VANCOCIN) 1,750 mg in sodium chloride 0.9 % 500 mL IVPB  Status:  Discontinued     1,750 mg 250 mL/hr over 120 Minutes Intravenous Every 48 hours 07/03/12 1541 07/03/12 1918   07/04/12 1700  vancomycin (VANCOCIN) IVPB 1000 mg/200 mL premix     1,000 mg 200 mL/hr over 60 Minutes Intravenous  Once 07/04/12 1648 07/04/12 1851   07/04/12 1600  ceFEPIme (MAXIPIME) 1 g in dextrose 5 % 50 mL IVPB     1 g 100 mL/hr over 30 Minutes Intravenous Every 24 hours 07/04/12 0858     07/04/12 0900  metroNIDAZOLE (FLAGYL) IVPB 500 mg     500 mg 100 mL/hr over 60 Minutes Intravenous Every 8 hours 07/04/12 0849     07/04/12 0000  vancomycin (VANCOCIN) 50 mg/mL oral solution 250 mg  Status:  Discontinued     250 mg Oral 4 times per day 07/03/12 1918 07/04/12 0849   07/03/12 2200  metroNIDAZOLE (FLAGYL) tablet 500 mg  Status:  Discontinued     500 mg Oral 3 times per day 07/03/12 1918 07/04/12 0849   07/03/12 1800  vancomycin (VANCOCIN) 50 mg/mL oral solution 125 mg  Status:  Discontinued     125 mg Oral 4 times per day 07/03/12 1537 07/03/12 1918   07/03/12 1700  oseltamivir (TAMIFLU) capsule 75 mg     75 mg Oral Daily 07/03/12 1540 07/08/12 0959   07/03/12 1530  vancomycin (VANCOCIN) 2,000 mg in sodium chloride 0.9 % 500 mL IVPB     2,000 mg 250 mL/hr over 120 Minutes Intravenous  Once 07/03/12 1526 07/03/12 1913   07/03/12 1530  ceFEPIme (MAXIPIME) 1 g in dextrose 5 % 50 mL IVPB  Status:  Discontinued     1 g 100 mL/hr over 30 Minutes Intravenous  Every 24 hours 07/03/12 1526 07/03/12 1918      Assessment/Plan: s/p * No surgery found *  -con't abx  -no NGT  - Pt appears to be stable and min improving, no surgical indications at this time Cdiff negative x2, 3rd pending.    LOS: 3 days    Rosario Jacks., [facility] 07/06/2012

## 2012-07-06 NOTE — Progress Notes (Signed)
Hypertension and generalized pain  Dilaudid switched to IV prn; Hydralazine ordered for SBP>180.

## 2012-07-07 DIAGNOSIS — D631 Anemia in chronic kidney disease: Secondary | ICD-10-CM

## 2012-07-07 DIAGNOSIS — N186 End stage renal disease: Secondary | ICD-10-CM

## 2012-07-07 DIAGNOSIS — K5289 Other specified noninfective gastroenteritis and colitis: Secondary | ICD-10-CM

## 2012-07-07 DIAGNOSIS — N039 Chronic nephritic syndrome with unspecified morphologic changes: Secondary | ICD-10-CM

## 2012-07-07 DIAGNOSIS — R197 Diarrhea, unspecified: Secondary | ICD-10-CM

## 2012-07-07 LAB — CBC
HCT: 28 % — ABNORMAL LOW (ref 39.0–52.0)
HCT: 28.3 % — ABNORMAL LOW (ref 39.0–52.0)
HCT: 28.4 % — ABNORMAL LOW (ref 39.0–52.0)
Hemoglobin: 9.2 g/dL — ABNORMAL LOW (ref 13.0–17.0)
Hemoglobin: 9.4 g/dL — ABNORMAL LOW (ref 13.0–17.0)
Hemoglobin: 9.3 g/dL — ABNORMAL LOW (ref 13.0–17.0)
MCH: 30.8 pg (ref 26.0–34.0)
MCH: 31.2 pg (ref 26.0–34.0)
MCH: 31.1 pg (ref 26.0–34.0)
MCHC: 32.9 g/dL (ref 30.0–36.0)
MCHC: 33.2 g/dL (ref 30.0–36.0)
MCHC: 32.7 g/dL (ref 30.0–36.0)
MCV: 93.6 fL (ref 78.0–100.0)
MCV: 94 fL (ref 78.0–100.0)
MCV: 95 fL (ref 78.0–100.0)
Platelets: DECREASED 10*3/uL (ref 150–400)
Platelets: 126 10*3/uL — ABNORMAL LOW (ref 150–400)
Platelets: 114 10*3/uL — ABNORMAL LOW (ref 150–400)
RBC: 2.99 MIL/uL — ABNORMAL LOW (ref 4.22–5.81)
RBC: 3.01 MIL/uL — ABNORMAL LOW (ref 4.22–5.81)
RBC: 2.99 MIL/uL — ABNORMAL LOW (ref 4.22–5.81)
RDW: 15.8 % — ABNORMAL HIGH (ref 11.5–15.5)
RDW: 15.8 % — ABNORMAL HIGH (ref 11.5–15.5)
RDW: 15.8 % — ABNORMAL HIGH (ref 11.5–15.5)
WBC: 9.2 10*3/uL (ref 4.0–10.5)
WBC: 9.4 10*3/uL (ref 4.0–10.5)
WBC: 8.5 10*3/uL (ref 4.0–10.5)

## 2012-07-07 LAB — RENAL FUNCTION PANEL
Albumin: 2 g/dL — ABNORMAL LOW (ref 3.5–5.2)
BUN: 71 mg/dL — ABNORMAL HIGH (ref 6–23)
CO2: 22 mEq/L (ref 19–32)
Calcium: 7.9 mg/dL — ABNORMAL LOW (ref 8.4–10.5)
Chloride: 101 mEq/L (ref 96–112)
Creatinine, Ser: 6.27 mg/dL — ABNORMAL HIGH (ref 0.50–1.35)
GFR calc Af Amer: 11 mL/min — ABNORMAL LOW (ref >90)
GFR calc non Af Amer: 10 mL/min — ABNORMAL LOW (ref >90)
Glucose, Bld: 186 mg/dL — ABNORMAL HIGH (ref 70–99)
Phosphorus: 5.6 mg/dL — ABNORMAL HIGH (ref 2.3–4.6)
Potassium: 3.5 mEq/L (ref 3.5–5.1)
Sodium: 139 mEq/L (ref 135–145)

## 2012-07-07 LAB — GLUCOSE, CAPILLARY
Glucose-Capillary: 116 mg/dL — ABNORMAL HIGH (ref 70–99)
Glucose-Capillary: 126 mg/dL — ABNORMAL HIGH (ref 70–99)
Glucose-Capillary: 130 mg/dL — ABNORMAL HIGH (ref 70–99)
Glucose-Capillary: 131 mg/dL — ABNORMAL HIGH (ref 70–99)
Glucose-Capillary: 147 mg/dL — ABNORMAL HIGH (ref 70–99)
Glucose-Capillary: 152 mg/dL — ABNORMAL HIGH (ref 70–99)
Glucose-Capillary: 165 mg/dL — ABNORMAL HIGH (ref 70–99)
Glucose-Capillary: 170 mg/dL — ABNORMAL HIGH (ref 70–99)
Glucose-Capillary: 210 mg/dL — ABNORMAL HIGH (ref 70–99)
Glucose-Capillary: 225 mg/dL — ABNORMAL HIGH (ref 70–99)
Glucose-Capillary: 246 mg/dL — ABNORMAL HIGH (ref 70–99)
Glucose-Capillary: 249 mg/dL — ABNORMAL HIGH (ref 70–99)
Glucose-Capillary: 217 mg/dL — ABNORMAL HIGH (ref 70–99)
Glucose-Capillary: 190 mg/dL — ABNORMAL HIGH (ref 70–99)
Glucose-Capillary: 227 mg/dL — ABNORMAL HIGH (ref 70–99)

## 2012-07-07 LAB — CULTURE, RESPIRATORY: Culture: NO GROWTH

## 2012-07-07 LAB — CULTURE, RESPIRATORY W GRAM STAIN

## 2012-07-07 LAB — MAGNESIUM: Magnesium: 2.3 mg/dL (ref 1.5–2.5)

## 2012-07-07 MED ORDER — LABETALOL HCL 5 MG/ML IV SOLN
10.0000 mg | INTRAVENOUS | Status: DC | PRN
  Administered 2012-07-07: 10 mg via INTRAVENOUS
  Filled 2012-07-07 (×2): qty 4

## 2012-07-07 MED ORDER — INSULIN GLARGINE 100 UNIT/ML ~~LOC~~ SOLN
10.0000 [IU] | Freq: Every day | SUBCUTANEOUS | Status: DC
  Administered 2012-07-07 – 2012-07-08 (×2): 10 [IU] via SUBCUTANEOUS

## 2012-07-07 MED ORDER — WHITE PETROLATUM GEL
Status: AC
  Administered 2012-07-07: 14:00:00
  Filled 2012-07-07: qty 5

## 2012-07-07 MED ORDER — INSULIN GLARGINE 100 UNIT/ML ~~LOC~~ SOLN
5.0000 [IU] | Freq: Every day | SUBCUTANEOUS | Status: DC
  Administered 2012-07-07: 5 [IU] via SUBCUTANEOUS

## 2012-07-07 MED ORDER — INSULIN ASPART 100 UNIT/ML ~~LOC~~ SOLN
1.0000 [IU] | SUBCUTANEOUS | Status: DC
  Administered 2012-07-07: 3 [IU] via SUBCUTANEOUS
  Administered 2012-07-07: 2 [IU] via SUBCUTANEOUS

## 2012-07-07 MED ORDER — SODIUM CHLORIDE 0.9 % IV SOLN
8.0000 mg/h | INTRAVENOUS | Status: DC
  Administered 2012-07-07: 8 mg/h via INTRAVENOUS
  Filled 2012-07-07 (×2): qty 80

## 2012-07-07 MED ORDER — INSULIN ASPART 100 UNIT/ML ~~LOC~~ SOLN
0.0000 [IU] | SUBCUTANEOUS | Status: DC
  Administered 2012-07-07 – 2012-07-08 (×2): 3 [IU] via SUBCUTANEOUS
  Administered 2012-07-08: 11 [IU] via SUBCUTANEOUS
  Administered 2012-07-08: 18:00:00 via SUBCUTANEOUS
  Administered 2012-07-08 (×2): 8 [IU] via SUBCUTANEOUS
  Administered 2012-07-08 – 2012-07-09 (×2): 5 [IU] via SUBCUTANEOUS
  Administered 2012-07-09: 3 [IU] via SUBCUTANEOUS
  Administered 2012-07-09: 5 [IU] via SUBCUTANEOUS
  Administered 2012-07-10 (×2): 3 [IU] via SUBCUTANEOUS
  Administered 2012-07-10: 2 [IU] via SUBCUTANEOUS
  Administered 2012-07-10 (×2): 3 [IU] via SUBCUTANEOUS
  Administered 2012-07-10: 8 [IU] via SUBCUTANEOUS
  Administered 2012-07-11: 3 [IU] via SUBCUTANEOUS
  Administered 2012-07-11: 11 [IU] via SUBCUTANEOUS
  Administered 2012-07-11 (×2): 5 [IU] via SUBCUTANEOUS
  Administered 2012-07-11: 2 [IU] via SUBCUTANEOUS
  Administered 2012-07-12: 15 [IU] via SUBCUTANEOUS
  Administered 2012-07-12: 5 [IU] via SUBCUTANEOUS
  Administered 2012-07-12: 3 [IU] via SUBCUTANEOUS
  Administered 2012-07-12: 8 [IU] via SUBCUTANEOUS
  Administered 2012-07-13: 10 [IU] via SUBCUTANEOUS
  Administered 2012-07-13 – 2012-07-14 (×3): 5 [IU] via SUBCUTANEOUS
  Administered 2012-07-14: 2 [IU] via SUBCUTANEOUS
  Administered 2012-07-14: 11 [IU] via SUBCUTANEOUS
  Administered 2012-07-14: 15 [IU] via SUBCUTANEOUS

## 2012-07-07 MED ORDER — DIPHENHYDRAMINE HCL 50 MG/ML IJ SOLN
25.0000 mg | Freq: Once | INTRAMUSCULAR | Status: AC
  Administered 2012-07-07: 25 mg via INTRAVENOUS

## 2012-07-07 NOTE — Consult Note (Signed)
Cross cover [facility]-Dr. Zenovia Jarred. Reason for Consult: Severe diarrhea.  Referring Physician: CCM  Dale [name] is an 47 y.o. male.  HPI:  47 year old, morbidly obese white male, with multiple medical problems listed below, admitted on 07/03/12 after a recent hospitalization for chest pain [when he was discharged on 02./25/14]. He claims a day prior to admission he had 20 loose watery BM's. He denies any melena or hematochezia. This was associated with nausea and vomiting. He is followed by Dr. Zenovia Jarred for diabetic gastroparesis and acid reflux. He subsequently developed septic shock and stool studies were negative for C.Difficle infection. He currently has NGT and a rectal tube in place. During the course of his admission he was on multiple antibiotics including Vancomycin [oral and IV], Metronidazole, Cefepime and Oseltamivir. Dr. Michel Bickers did an ID consult today and discontinued all his antibiotics. He has had a colonoscopy don by Dr. Hilarie Fredrickson last year but I am not able to find the operative report.   Past Medical History  Diagnosis Date  . Insulin dependent diabetes mellitus   . Hypertension   . History of blood transfusion   . ESRD (end stage renal disease)     a. 1995 s/p cadaveric transplant w/ susbequent failure after 18 yrs;  b.Dialysis initiated 07/2011  . Dyspnea     a. 07/23/11 Echo: EF 55-60%  . Anemia of chronic disease   . Cholecystitis     a. 08/27/2011  . Constipation   . Pericardial effusion     a.  Small by CT 08/22/11;  b.  Large by CT 08/27/11  . Inguinal lymphadenopathy     a. bilateral - s/p biopsy 07/2011   Past Surgical History  Procedure Laterality Date  . Appendectomy    . Hernia repair    . Kidney transplant    . Cataract extraction    . Arteriovenous graft placement    . Av fistula placement    . Toe amputation    . Below knee leg amputation    . Carpal tunnel release    . Shoulder surgery    . Av fistula placement  07/29/2011    Procedure: ARTERIOVENOUS  (AV) FISTULA CREATION;  Surgeon: Mal Misty, MD;  Location: [place];  Service: Vascular;  Laterality: Right;  Brachial cephalic  . Insertion of dialysis catheter  08/01/2011    Procedure: INSERTION OF DIALYSIS CATHETER;  Surgeon: Rosetta Posner, MD;  Location: [place];  Service: Vascular;  Laterality: Right;  insertion of dialysis catheter on right internal jugular vein  . Lymph node biopsy  08/10/2011    Procedure: LYMPH NODE BIOPSY;  Surgeon: Merrie Roof, MD;  Location: [place];  Service: General;  Laterality: Left;  left groin lymph biopsy  . Colonoscopy  08/29/2011    Procedure: COLONOSCOPY;  Surgeon: Jerene Bears, MD;  Location: [place];  Service: Gastroenterology;  Laterality: N/A;   Family History  Problem Relation Age of Onset  . Malignant hyperthermia Neg Hx   . Colon polyps Father   . Diabetes Father   . Cancer Father   . Breast cancer Maternal Aunt   . Cancer Maternal Aunt     Breast and Bone   Social History:  reports that he has never smoked. He has never used smokeless tobacco. He reports that  drinks alcohol. He reports that he does not use illicit drugs.  Allergies:  Allergies  Allergen Reactions  . Morphine And Related Hives and Nausea  Only  . Amoxicillin Other (See Comments)    Reaction unknown  . Ciprofloxacin Other (See Comments)    Reaction unknown  . Clindamycin/Lincomycin Other (See Comments)    Reaction unknown  . Codeine Other (See Comments)    Reaction unknown. But pt has tolerated hydrocodone in Vicodin and Norco in the past  . Levofloxacin Other (See Comments)    Reaction unknown  . Vasotec Cough  . Oxycodone Rash   Medications: I have reviewed the patient's current medications.  Results for orders placed during the hospital encounter of 07/03/12 (from the past 48 hour(s))  GLUCOSE, CAPILLARY     Status: Abnormal   Collection Time    07/05/12  7:57 PM      Result Value Range   Glucose-Capillary 265 (*) 70 - 99 mg/dL  GLUCOSE, CAPILLARY      Status: Abnormal   Collection Time    07/06/12 12:01 AM      Result Value Range   Glucose-Capillary 198 (*) 70 - 99 mg/dL   Comment 1 Documented in Chart     Comment 2 Notify RN    HEMOGLOBIN AND HEMATOCRIT, BLOOD     Status: Abnormal   Collection Time    07/06/12  2:57 AM      Result Value Range   Hemoglobin 9.9 (*) 13.0 - 17.0 g/dL   HCT 29.7 (*) 39.0 - 52.0 %  GLUCOSE, CAPILLARY     Status: Abnormal   Collection Time    07/06/12  3:54 AM      Result Value Range   Glucose-Capillary 206 (*) 70 - 99 mg/dL   Comment 1 Documented in Chart     Comment 2 Notify RN    CBC     Status: Abnormal   Collection Time    07/06/12  4:16 AM      Result Value Range   WBC 7.6  4.0 - 10.5 K/uL   RBC 2.96 (*) 4.22 - 5.81 MIL/uL   Hemoglobin 9.4 (*) 13.0 - 17.0 g/dL   HCT 28.3 (*) 39.0 - 52.0 %   MCV 95.6  78.0 - 100.0 fL   MCH 31.8  26.0 - 34.0 pg   MCHC 33.2  30.0 - 36.0 g/dL   RDW 16.1 (*) 11.5 - 15.5 %   Platelets    150 - 400 K/uL   Value: PLATELET CLUMPS NOTED ON SMEAR, COUNT APPEARS DECREASED   Comment: PLATELET CLUMPING, SUGGEST RECOLLECTION OF SAMPLE IN CITRATE TUBE.  PROCALCITONIN     Status: None   Collection Time    07/06/12  4:16 AM      Result Value Range   Procalcitonin >175.00     Comment:            Interpretation:     PCT >= 10 ng/mL:     Important systemic inflammatory response,     almost exclusively due to severe bacterial     sepsis or septic shock.     (NOTE)             ICU PCT Algorithm               Non ICU PCT Algorithm        ----------------------------     ------------------------------             PCT < 0.25 ng/mL                 PCT < 0.1 ng/mL  Stopping of antibiotics            Stopping of antibiotics           strongly encouraged.               strongly encouraged.        ----------------------------     ------------------------------           PCT level decrease by               PCT < 0.25 ng/mL           >= 80% from peak PCT           OR  PCT 0.25 - 0.5 ng/mL          Stopping of antibiotics                                                 encouraged.         Stopping of antibiotics               encouraged.        ----------------------------     ------------------------------           PCT level decrease by              PCT >= 0.25 ng/mL           < 80% from peak PCT            AND PCT >= 0.5 ng/mL            Continuing antibiotics                                                  encouraged.           Continuing antibiotics                encouraged.        ----------------------------     ------------------------------         PCT level increase compared          PCT > 0.5 ng/mL             with peak PCT AND              PCT >= 0.5 ng/mL             Escalation of antibiotics                                              strongly encouraged.          Escalation of antibiotics            strongly encouraged.  POCT I-STAT 3, BLOOD GAS (G3+)     Status: Abnormal   Collection Time    07/06/12  4:19 AM      Result Value Range   pH, Arterial 7.412  7.350 - 7.450   pCO2 arterial 34.6 (*) 35.0 - 45.0 mmHg   pO2, Arterial 154.0 (*) 80.0 - 100.0 mmHg   Bicarbonate 22.1  20.0 -  24.0 mEq/L   TCO2 23  0 - 100 mmol/L   O2 Saturation 99.0     Acid-base deficit 2.0  0.0 - 2.0 mmol/L   Patient temperature 98.3 F     Collection site ARTERIAL LINE     Drawn by Nurse     Sample type ARTERIAL    BLOOD GAS, ARTERIAL     Status: Abnormal   Collection Time    07/06/12  4:47 AM      Result Value Range   FIO2 0.40     Delivery systems VENTILATOR     Mode PRESSURE REGULATED VOLUME CONTROL     VT 550     Rate 18.0     Peep/cpap 5.0     pH, Arterial 7.386  7.350 - 7.450   pCO2 arterial 33.9 (*) 35.0 - 45.0 mmHg   pO2, Arterial 160.0 (*) 80.0 - 100.0 mmHg   Bicarbonate 19.9 (*) 20.0 - 24.0 mEq/L   TCO2 20.9  0 - 100 mmol/L   Acid-base deficit 4.3 (*) 0.0 - 2.0 mmol/L   O2 Saturation 98.3     Patient temperature 98.6      Collection site A-LINE     Drawn by [id number]     Sample type ARTERIAL DRAW    GLUCOSE, CAPILLARY     Status: Abnormal   Collection Time    07/06/12  8:06 AM      Result Value Range   Glucose-Capillary 242 (*) 70 - 99 mg/dL  RENAL FUNCTION PANEL     Status: Abnormal   Collection Time    07/06/12  8:30 AM      Result Value Range   Sodium 137  135 - 145 mEq/L   Potassium 4.3  3.5 - 5.1 mEq/L   Chloride 98  96 - 112 mEq/L   CO2 21  19 - 32 mEq/L   Glucose, Bld 268 (*) 70 - 99 mg/dL   BUN 55 (*) 6 - 23 mg/dL   Comment: DELTA CHECK NOTED   Creatinine, Ser 5.63 (*) 0.50 - 1.35 mg/dL   Comment: DELTA CHECK NOTED   Calcium 7.6 (*) 8.4 - 10.5 mg/dL   Phosphorus 5.5 (*) 2.3 - 4.6 mg/dL   Albumin 2.0 (*) 3.5 - 5.2 g/dL   GFR calc non Af Amer 11 (*) >90 mL/min   GFR calc Af Amer 13 (*) >90 mL/min   Comment:            The eGFR has been calculated     using the CKD EPI equation.     This calculation has not been     validated in all clinical     situations.     eGFR's persistently     <90 mL/min signify     possible Chronic Kidney Disease.  VANCOMYCIN, RANDOM     Status: None   Collection Time    07/06/12  8:41 AM      Result Value Range   Vancomycin Rm 27.6     Comment:            Random Vancomycin therapeutic     range is dependent on dosage and     time of specimen collection.     A peak range is 20.0-40.0 ug/mL     A trough range is 5.0-15.0 ug/mL             HEMOGLOBIN AND HEMATOCRIT, BLOOD     Status: Abnormal   Collection Time  07/06/12 10:26 AM      Result Value Range   Hemoglobin 11.3 (*) 13.0 - 17.0 g/dL   Comment: REPEATED TO VERIFY   HCT 33.4 (*) 39.0 - 52.0 %  MAGNESIUM     Status: None   Collection Time    07/06/12 10:26 AM      Result Value Range   Magnesium 2.3  1.5 - 2.5 mg/dL  GLUCOSE, CAPILLARY     Status: Abnormal   Collection Time    07/06/12 11:57 AM      Result Value Range   Glucose-Capillary 233 (*) 70 - 99 mg/dL  GLUCOSE, CAPILLARY      Status: Abnormal   Collection Time    07/06/12  1:11 PM      Result Value Range   Glucose-Capillary 236 (*) 70 - 99 mg/dL  GLUCOSE, CAPILLARY     Status: Abnormal   Collection Time    07/06/12  2:38 PM      Result Value Range   Glucose-Capillary 249 (*) 70 - 99 mg/dL  GLUCOSE, CAPILLARY     Status: Abnormal   Collection Time    07/06/12  3:32 PM      Result Value Range   Glucose-Capillary 246 (*) 70 - 99 mg/dL  GLUCOSE, CAPILLARY     Status: Abnormal   Collection Time    07/06/12  4:37 PM      Result Value Range   Glucose-Capillary 225 (*) 70 - 99 mg/dL  GLUCOSE, CAPILLARY     Status: Abnormal   Collection Time    07/06/12  5:41 PM      Result Value Range   Glucose-Capillary 210 (*) 70 - 99 mg/dL  GLUCOSE, CAPILLARY     Status: Abnormal   Collection Time    07/06/12  6:45 PM      Result Value Range   Glucose-Capillary 165 (*) 70 - 99 mg/dL  GLUCOSE, CAPILLARY     Status: Abnormal   Collection Time    07/06/12  7:49 PM      Result Value Range   Glucose-Capillary 147 (*) 70 - 99 mg/dL  GLUCOSE, CAPILLARY     Status: Abnormal   Collection Time    07/06/12  8:59 PM      Result Value Range   Glucose-Capillary 116 (*) 70 - 99 mg/dL  GLUCOSE, CAPILLARY     Status: Abnormal   Collection Time    07/06/12 10:29 PM      Result Value Range   Glucose-Capillary 126 (*) 70 - 99 mg/dL  GLUCOSE, CAPILLARY     Status: Abnormal   Collection Time    07/06/12 11:52 PM      Result Value Range   Glucose-Capillary 131 (*) 70 - 99 mg/dL  GLUCOSE, CAPILLARY     Status: Abnormal   Collection Time    07/07/12  1:11 AM      Result Value Range   Glucose-Capillary 130 (*) 70 - 99 mg/dL  GLUCOSE, CAPILLARY     Status: Abnormal   Collection Time    07/07/12  2:39 AM      Result Value Range   Glucose-Capillary 152 (*) 70 - 99 mg/dL  GLUCOSE, CAPILLARY     Status: Abnormal   Collection Time    07/07/12  3:39 AM      Result Value Range   Glucose-Capillary 170 (*) 70 - 99 mg/dL  MAGNESIUM      Status: None   Collection Time  07/07/12  4:10 AM      Result Value Range   Magnesium 2.3  1.5 - 2.5 mg/dL  CBC     Status: Abnormal   Collection Time    07/07/12  4:10 AM      Result Value Range   WBC 9.2  4.0 - 10.5 K/uL   RBC 2.99 (*) 4.22 - 5.81 MIL/uL   Hemoglobin 9.2 (*) 13.0 - 17.0 g/dL   Comment: DELTA CHECK NOTED     REPEATED TO VERIFY   HCT 28.0 (*) 39.0 - 52.0 %   MCV 93.6  78.0 - 100.0 fL   MCH 30.8  26.0 - 34.0 pg   MCHC 32.9  30.0 - 36.0 g/dL   RDW 15.8 (*) 11.5 - 15.5 %   Platelets    150 - 400 K/uL   Value: PLATELET CLUMPS NOTED ON SMEAR, COUNT APPEARS DECREASED  RENAL FUNCTION PANEL     Status: Abnormal   Collection Time    07/07/12  4:11 AM      Result Value Range   Sodium 139  135 - 145 mEq/L   Potassium 3.5  3.5 - 5.1 mEq/L   Comment: DELTA CHECK NOTED   Chloride 101  96 - 112 mEq/L   CO2 22  19 - 32 mEq/L   Glucose, Bld 186 (*) 70 - 99 mg/dL   BUN 71 (*) 6 - 23 mg/dL   Creatinine, Ser 6.27 (*) 0.50 - 1.35 mg/dL   Calcium 7.9 (*) 8.4 - 10.5 mg/dL   Phosphorus 5.6 (*) 2.3 - 4.6 mg/dL   Albumin 2.0 (*) 3.5 - 5.2 g/dL   GFR calc non Af Amer 10 (*) >90 mL/min   GFR calc Af Amer 11 (*) >90 mL/min   Comment:            The eGFR has been calculated     using the CKD EPI equation.     This calculation has not been     validated in all clinical     situations.     eGFR's persistently     <90 mL/min signify     possible Chronic Kidney Disease.  GLUCOSE, CAPILLARY     Status: Abnormal   Collection Time    07/07/12  8:01 AM      Result Value Range   Glucose-Capillary 217 (*) 70 - 99 mg/dL  GLUCOSE, CAPILLARY     Status: Abnormal   Collection Time    07/07/12 11:41 AM      Result Value Range   Glucose-Capillary 190 (*) 70 - 99 mg/dL  CBC     Status: Abnormal   Collection Time    07/07/12 11:47 AM      Result Value Range   WBC 9.4  4.0 - 10.5 K/uL   RBC 3.01 (*) 4.22 - 5.81 MIL/uL   Hemoglobin 9.4 (*) 13.0 - 17.0 g/dL   HCT 28.3 (*) 39.0 -  52.0 %   MCV 94.0  78.0 - 100.0 fL   MCH 31.2  26.0 - 34.0 pg   MCHC 33.2  30.0 - 36.0 g/dL   RDW 15.8 (*) 11.5 - 15.5 %   Platelets 126 (*) 150 - 400 K/uL  GLUCOSE, CAPILLARY     Status: Abnormal   Collection Time    07/07/12  3:46 PM      Result Value Range   Glucose-Capillary 227 (*) 70 - 99 mg/dL  CBC     Status: Abnormal  Collection Time    07/07/12  5:25 PM      Result Value Range   WBC 8.5  4.0 - 10.5 K/uL   RBC 2.99 (*) 4.22 - 5.81 MIL/uL   Hemoglobin 9.3 (*) 13.0 - 17.0 g/dL   HCT 28.4 (*) 39.0 - 52.0 %   MCV 95.0  78.0 - 100.0 fL   MCH 31.1  26.0 - 34.0 pg   MCHC 32.7  30.0 - 36.0 g/dL   RDW 15.8 (*) 11.5 - 15.5 %   Platelets 114 (*) 150 - 400 K/uL   Dg Chest Port 1 View  07/06/2012  *RADIOLOGY REPORT*  Clinical Data: Endotracheal tube assessment.  Diabetes. Respiratory failure.  PORTABLE CHEST - 1 VIEW  Comparison: 07/05/2012  Findings: Endotracheal tube tip is 2.8 cm above the carina.  Left central line tip projects over the SVC.  Nasogastric tube extends into the stomach.  Low lung volumes noted with cardiomegaly.  Linear subsegmental atelectasis observed in the lingula.  Indistinct pulmonary vasculature noted.  IMPRESSION:  1.  ET tube tip 2.8 cm above carina. 2.  Cardiomegaly with indistinct pulmonary vasculature favoring pulmonary venous hypertension. 3.  Subsegmental atelectasis in the lingula.   Original Report Authenticated By: Van Clines, M.D.    Review of Systems  Constitutional: Positive for malaise/fatigue. Negative for fever, chills and weight loss.  HENT: Positive for neck pain.   Cardiovascular: Negative for chest pain, palpitations, orthopnea and claudication.  Gastrointestinal: Positive for heartburn and diarrhea. Negative for nausea, vomiting, abdominal pain, constipation, blood in stool and melena.  Musculoskeletal: Positive for myalgias, back pain and joint pain.  Skin: Negative.   Neurological: Positive for weakness.  Psychiatric/Behavioral:  Positive for depression. The patient is nervous/anxious.    Blood pressure 147/78, pulse 82, temperature 97.8 F (36.6 C), temperature source Oral, resp. rate 17, height 5\' 8"  (1.727 m), weight 120 kg (264 lb 8.8 oz), SpO2 99.00%. Physical Exam  Constitutional: He is oriented to person, place, and time. He appears well-developed and well-nourished.  HENT:  Head: Normocephalic and atraumatic.  Eyes: Conjunctivae and EOM are normal. Pupils are equal, round, and reactive to light.  Neck: Normal range of motion. Neck supple.  Cardiovascular: Normal rate and regular rhythm.   Respiratory: Effort normal and breath sounds normal.  GI: Soft. He exhibits distension and mass. There is no tenderness. There is no rebound and no guarding.  Neurological: He is alert and oriented to person, place, and time.  Skin: Skin is warm and dry.  Psychiatric: Judgment and thought content normal.   Assessment/Plan: 1) Severe diarrhea with negative stool studies. Agree with Dr. Hale Bogus plan. We will try to procure his colonoscopy report in the interim and monitor his symptoms off of all the antibiotics. CT scan shows ?small bowel and colonic infection vs inflammation ? enterocolitis vs ischemia. If his symptoms do not improve in the next 24-48 hours a bedside flexible sigmoidoscopy will be considered.   2) S/P Septic shock with ARF.  3) CKD S/P Renal transplant-failed since 07/2011-on HD. 4) Type I DM with gastroparesis on Reglan at home.  5) Acid reflux on Zantac at home. 6) OSA  7) Anemia of chronic disease. 8) S/P BKA   Swan Zayed 07/07/2012, 6:22 PM

## 2012-07-07 NOTE — Progress Notes (Signed)
Patient ID: Dale [name], male   DOB: 1965-09-20, 47 y.o.   MRN: [id number]   [facility] Progress Note    Subjective:   Extubated yesterday and refused CPAP overnight (claustrophobic). States he is thirsty and wants something to drink (NGT in-situ)   Objective:   BP 168/64  Pulse 84  Temp(Src) 97.5 F (36.4 C) (Oral)  Resp 16  Ht 5\' 8"  (1.727 m)  Wt 121.2 kg (267 lb 3.2 oz)  BMI 40.64 kg/m2  SpO2 100%  Intake/Output Summary (Last 24 hours) at 07/07/12 0742 Last data filed at 07/07/12 0700  Gross per 24 hour  Intake 1145.69 ml  Output   1265 ml  Net -119.31 ml   Weight change: -1.4 kg (-3 lb 1.4 oz)  Physical Exam: [id number] uncomfortable resting in bed [id number] RRR, normal S1 and S2  Resp:CTA bilaterally, no rales/rhonchi [id number], obese, Tympanitic, BS normal [id number] edema over UEs and Right LE, LLE s/p BKA  Imaging: Dg Chest Port 1 View  07/06/2012  *RADIOLOGY REPORT*  Clinical Data: Endotracheal tube assessment.  Diabetes. Respiratory failure.  PORTABLE CHEST - 1 VIEW  Comparison: 07/05/2012  Findings: Endotracheal tube tip is 2.8 cm above the carina.  Left central line tip projects over the SVC.  Nasogastric tube extends into the stomach.  Low lung volumes noted with cardiomegaly.  Linear subsegmental atelectasis observed in the lingula.  Indistinct pulmonary vasculature noted.  IMPRESSION:  1.  ET tube tip 2.8 cm above carina. 2.  Cardiomegaly with indistinct pulmonary vasculature favoring pulmonary venous hypertension. 3.  Subsegmental atelectasis in the lingula.   Original Report Authenticated By: Van Clines, M.D.     Labs: BMET  Recent Labs Lab 07/03/12 1323 07/03/12 2014 07/04/12 0635 07/04/12 1026 07/05/12 0355 07/05/12 1821 07/06/12 0830 07/07/12 0411  NA 132*  --  137 137 138 134* 137 139  K 3.8  --  3.1* 2.9* 2.7* 4.3 4.3 3.5  CL 96  --  100 102 101 98 98 101  CO2 18*  --  14* 17* 19 16* 21 22  GLUCOSE 188*  --  125*  90 115* 328* 268* 186*  BUN 110*  --  114* 98* 81* 87* 55* 71*  CREATININE 6.60* 6.89* 7.40* 6.62* 7.87* 8.52* 5.63* 6.27*  CALCIUM 8.9  --  8.3* 8.0* 7.7* 7.4* 7.6* 7.9*  PHOS  --   --  3.6  --  4.4  --  5.5* 5.6*   CBC  Recent Labs Lab 07/03/12 1323  07/04/12 1026  07/05/12 0355  07/06/12 0257 07/06/12 0416 07/06/12 1026 07/07/12 0410  WBC 18.4*  < > 9.3  --  6.4  --   --  7.6  --  9.2  NEUTROABS 17.0*  --   --   --   --   --   --   --   --   --   HGB 11.4*  < > 10.4*  < > 9.8*  < > 9.9* 9.4* 11.3* 9.2*  HCT 34.7*  < > 30.9*  < > 29.4*  < > 29.7* 28.3* 33.4* 28.0*  MCV 97.2  < > 95.4  --  96.4  --   --  95.6  --  93.6  PLT 149*  < > PLATELET CLUMPS NOTED ON SMEAR, COUNT APPEARS DECREASED  --  PLATELET CLUMPS NOTED ON SMEAR, UNABLE TO ESTIMATE  --   --  PLATELET CLUMPS NOTED ON SMEAR, COUNT APPEARS DECREASED  --  PLATELET CLUMPS NOTED  ON SMEAR, COUNT APPEARS DECREASED  < > = values in this interval not displayed.  Medications:    . antiseptic oral rinse  15 mL Mouth Rinse QID  . aspirin  81 mg Oral Daily  . ceFEPime (MAXIPIME) IV  1 g Intravenous Q24H  . chlorhexidine  15 mL Mouth Rinse BID  . cycloSPORINE  175 mg Oral BID  . hydrocortisone sodium succinate  50 mg Intravenous Q6H  . insulin aspart  1-3 Units Subcutaneous Q4H  . insulin glargine  5 Units Subcutaneous QHS  . levothyroxine  88 mcg Oral QAC breakfast  . metronidazole  500 mg Intravenous Q8H  . oseltamivir  75 mg Oral Daily  . sodium chloride  3 mL Intravenous Q12H  . sodium chloride  3 mL Intravenous Q12H  . vancomycin  500 mg Per Tube Q6H     Assessment/ Plan:   1. Acute renal failure chronic kidney disease stage IV/V: Suspect evolution to dialysis dependent ESRD following significant renal injury. Plan for dialysis today upstairs in HD unit to assist with clearance and correction of metabolic abnormalities. Should be able to use arteriovenous fistula. No indications for placement of dialysis catheter as  yet.  2. Respiratory failure/sepsis: Suspected intra-abdominal pathology-possible ischemic bowel. Ongoing close evaluation by surgery after CT scan of the abdomen that was erroneously done without contrast failed to add to diagnosis- ? Need to repeat CT abdomen/pelvis with IV contrast. . Remains on broad-spectrum antibiotic therapy with Maxipime and vancomycin as well as metronidazole. C difficile testing negative. 3. Anemia: Suspect underlying anemia of chronic kidney disease now compounded by acute illness, initiate ESA therapy   Elmarie Shiley, MD 07/07/2012, 7:42 AM

## 2012-07-07 NOTE — Progress Notes (Signed)
PULMONARY  / CRITICAL CARE MEDICINE  Name: Dale [name] MRN: [id number] DOB: January 14, 1966    ADMISSION DATE:  07/03/2012 CONSULTATION DATE:  07/04/2012  REFERRING MD :  TRH  CHIEF COMPLAINT:  Respiratory failure and septic shock.  BRIEF PATIENT DESCRIPTION: 47 yo with DM1, HTN and ESRD s/p transplant (failing) admitted with shock in respiratory failure in setting of abdominal pain / distension, diarrhea and dehydration.   SIGNIFICANT EVENTS / STUDIES:  3/12 VDRF and septic shock 3/12 CT abd/pelvis >>> Circumferential thickening of distal small bowel and proximal colon - enterocolitis vs ischemia  LINES / TUBES: L IJ TLC 3/12 >>> ETT 3/12 >>> 3/14 Foley 3/12 >>> NGT 3/12 >>>  CULTURES: 3/12  Blood >>> 3/12  Urine >>> neg  3/12  Sputum  >>> neg  3/12  C. diff >>> neg  3/13  C diff >>> neg 3/11  Influz  >>> neg  3/13  CMV  >>>  ANTIBIOTICS: Vanc IV 3/12 >>> Vanc PO 3/13 >>> Flagyl 3/11 >>> Cefepime 3/11 >>> Tamiflu 3/10 >>> 3/15  SUBJECTIVE: Extubated 3/14 and tolerating well.  For HD today.  Abdominal pain persists. Liquid stools persist.  VITAL SIGNS: Temp:  [97.5 F (36.4 C)-98.7 F (37.1 C)] 98.7 F (37.1 C) (03/15 0900) Pulse Rate:  [80-95] 82 (03/15 1000) Resp:  [15-23] 15 (03/15 1000) BP: (131-170)/(63-65) 168/64 mmHg (03/14 1800) SpO2:  [100 %] 100 % (03/15 1000) Arterial Line BP: (146-193)/(59-78) 162/61 mmHg (03/15 0900) Weight:  [121.2 kg (267 lb 3.2 oz)] 121.2 kg (267 lb 3.2 oz) (03/15 0400)  HEMODYNAMICS: CVP:  [15 mmHg-16 mmHg] 16 mmHg  VENTILATOR SETTINGS:   INTAKE / OUTPUT: Intake/Output     03/14 0701 - 03/15 0700 03/15 0701 - 03/16 0700   I.V. (mL/kg) 495.7 (4.1) 40 (0.3)   Other     NG/GT 400    IV Piggyback 250    Total Intake(mL/kg) 1145.7 (9.5) 40 (0.3)   Urine (mL/kg/hr) 615 (0.2) 125 (0.2)   Emesis/NG output 450 (0.2)    Other     Stool 200 (0.1)    Total Output 1265 125   Net -119.3 -85         PHYSICAL EXAMINATION: General:   Chronically ill appearing male Neuro:  awake, follows commands, moves all ext HEENT: Dry mucosa  Cardiovascular:  regular, Nl S1/S2, -M/R/G. Lungs:  Diminshed BS in bases  Abdomen:  Soft, diffusely tender, distended with hypoactive bowel sounds. Musculoskeletal:  L BKA, otherwise normal. Skin:  Bruising but otherwise intact.  LABS:  Recent Labs Lab 07/03/12 1323 07/03/12 1415  07/04/12 0635  07/04/12 1013 07/04/12 1026 07/04/12 1225  07/05/12 0355 07/05/12 0435  07/05/12 1821  07/06/12 0416 07/06/12 0419 07/06/12 0447 07/06/12 0830 07/06/12 1026 07/07/12 0410 07/07/12 0411  HGB 11.4*  --   < > 11.2*  --   --  10.4*  --   < > 9.8*  --   < > 9.0*  < > 9.4*  --   --   --  11.3* 9.2*  --   WBC 18.4*  --   < > 10.6*  --   --  9.3  --   --  6.4  --   --   --   --  7.6  --   --   --   --  9.2  --   PLT 149*  --   < > 122*  --   --  PLATELET CLUMPS NOTED ON SMEAR,  COUNT APPEARS DECREASED  --   --  PLATELET CLUMPS NOTED ON SMEAR, UNABLE TO ESTIMATE  --   --   --   --  PLATELET CLUMPS NOTED ON SMEAR, COUNT APPEARS DECREASED  --   --   --   --  PLATELET CLUMPS NOTED ON SMEAR, COUNT APPEARS DECREASED  --   NA 132*  --   --  137  --   --  137  --   --  138  --   --  134*  --   --   --   --  137  --   --  139  K 3.8  --   --  3.1*  --   --  2.9*  --   --  2.7*  --   --  4.3  --   --   --   --  4.3  --   --  3.5  CL 96  --   --  100  --   --  102  --   --  101  --   --  98  --   --   --   --  98  --   --  101  CO2 18*  --   --  14*  --   --  17*  --   --  19  --   --  16*  --   --   --   --  21  --   --  22  GLUCOSE 188*  --   --  125*  --   --  90  --   --  115*  --   --  328*  --   --   --   --  268*  --   --  186*  BUN 110*  --   --  114*  --   --  98*  --   --  81*  --   --  87*  --   --   --   --  55*  --   --  71*  CREATININE 6.60*  --   < > 7.40*  --   --  6.62*  --   --  7.87*  --   --  8.52*  --   --   --   --  5.63*  --   --  6.27*  CALCIUM 8.9  --   --  8.3*  --   --  8.0*  --   --   7.7*  --   --  7.4*  --   --   --   --  7.6*  --   --  7.9*  MG  --   --   --   --   --   --   --   --   --  1.6  --   --   --   --   --   --   --   --  2.3 2.3  --   PHOS  --   --   < > 3.6  --   --   --   --   --  4.4  --   --   --   --   --   --   --  5.5*  --   --  5.6*  AST 19  --   --  42*  --   --  63*  --   --   --   --   --   --   --   --   --   --   --   --   --   --   ALT 10  --   --  12  --   --  13  --   --   --   --   --   --   --   --   --   --   --   --   --   --   ALKPHOS 67  --   --  59  --   --  51  --   --   --   --   --   --   --   --   --   --   --   --   --   --   BILITOT 0.4  --   --  0.4  --   --  0.4  --   --   --   --   --   --   --   --   --   --   --   --   --   --   PROT 7.3  --   --  6.9  --   --  6.5  --   --   --   --   --   --   --   --   --   --   --   --   --   --   ALBUMIN 2.3*  --   --  2.2*  --   --  2.0*  --   --   --   --   --   --   --   --   --   --  2.0*  --   --  2.0*  APTT  --   --   --   --   --   --  44*  --   --   --   --   --   --   --   --   --   --   --   --   --   --   INR  --   --   --   --   --   --  1.63*  --   --   --   --   --   --   --   --   --   --   --   --   --   --   LATICACIDVEN  --  2.73*  --   --   --  0.9  --   --   --   --   --   --   --   --   --   --   --   --   --   --   --   TROPONINI  --   --   --   --   --   --   --  0.60*  --   --   --   --   --   --   --   --   --   --   --   --   --   PROCALCITON  --   --   --   --   --  >  175.00  --   --   --   --   --   --   --   --  >175.00  --   --   --   --   --   --   PHART  --   --   --   --   < >  --   --  7.431  --   --  7.434  --   --   --   --  7.412 7.386  --   --   --   --   PCO2ART  --   --   --   --   < >  --   --  30.4*  --   --  30.0*  --   --   --   --  34.6* 33.9*  --   --   --   --   PO2ART  --   --   --   --   < >  --   --  399.0*  --   --  173.0*  --   --   --   --  154.0* 160.0*  --   --   --   --   < > = values in this interval not displayed.  Recent Labs Lab  07/06/12 2352 07/07/12 0111 07/07/12 0239 07/07/12 0339 07/07/12 0801  GLUCAP 131* 130* 152* 170* 217*   CXR: None today  ASSESSMENT / PLAN:  PULMONARY A: acute respiratory failure in setting of sepsis / abdominal distension, resolved, extubated. P:   Goal SpO2>92 Supplemental oxygen CXR in AM BiPAP at night  CARDIOVASCULAR A: Septic / hypovolemia shock, resolving, now off pressors, hypotensive. P:  Labetalol IV PRN SBP>160 or DBP<110   ASA.  RENAL A:  Acute on chronic renal failure. Hypokalemia. Failed renal transplant. Chronic immunosuppression with cyclosporin P:   HD per renal Trend BMP Replace K PRN  GASTROINTESTINAL A:  Colitis (ischemic vs CMC vs pseudomembranous) P:   NGT to Sx GI consult for possible colonoscopy Surgery following, no indication for OR at this time Vascular doppler abdomen / pelvis to evaluate for mesenteric ischemia   HEMATOLOGIC A:  Leukocytosis from infection on presentation, then leukopenia.  Anemia, stable. P:  Trend CBC  INFECTIOUS A: No clear source of infection.  Unclear how to interpret elevated PCT in setting of renal failure, immunocompromise, etc.  P:   Continue antibiotics as above Clinically no indications to escalate to antifungal coverage ID consult  ENDOCRINE A:  DM1. Chronic pred use and presumed adrenal insufficiency. P:   SSI Increase Lantus to 10 Stress dose Hydrocortisone  NEUROLOGIC A:  No active issues. P:   No intervention required  CLINICAL SUMMARY: 47 yo with DM1, HTN and ESRD s/p transplant (failing) admitted with shock in respiratory failure in setting of abdominal pain / distension, diarrhea and dehydration. Now extubated and off vasopressors.  HD per renal.  On multiple antiinfectives, immunocompromised and no clear source of infection - will request ID consult.  GI consult for possible colonoscopy.  PARRETT,TAMMY NP- C  07/07/2012, 11:10 AM [facility]  [phone]  I  have personally obtained a history, examined the patient, evaluated laboratory and imaging results, formulated the assessment and plan and placed orders.  Doree Fudge, MD [facility] and [place] Pager: [phone]  07/07/2012, 11:38 AM

## 2012-07-07 NOTE — Progress Notes (Signed)
eLink Physician-Brief Progress Note Patient Name: Carlyle Wilner DOB: 1965/09/24 MRN: [id number]  Date of Service  07/07/2012   HPI/Events of Note   Best practice  eICU Interventions  Add scd   Intervention Category Intermediate Interventions: Best-practice therapies (e.g. DVT, beta blocker, etc.)  Raylene Miyamoto. 07/07/2012, 11:43 PM

## 2012-07-07 NOTE — Evaluation (Signed)
Physical Therapy Evaluation Patient Details Name: Dale [name] MRN: [id number] DOB: Apr 04, 1966 Today's Date: 07/07/2012 Time: [id number] PT Time Calculation (min): 41 min  PT Assessment / Plan / Recommendation Clinical Impression  pt presents with VDRF, ESRD, and Sepsis.  pt with hx multiple medical problems and is very debilitated.  pt needs continued therapies to maximize independence.  Would benefit from CIR.      PT Assessment  Patient needs continued PT services    Follow Up Recommendations  CIR    Does the patient have the potential to tolerate intense rehabilitation      Barriers to Discharge None      Equipment Recommendations  None recommended by PT    Recommendations for Other Services OT consult;Rehab consult   Frequency Min 3X/week    Precautions / Restrictions Precautions Precautions: Fall Precaution Comments: NG tube, Flexiseal, multiple IV lines, old L BKA.   Restrictions Weight Bearing Restrictions: No   Pertinent Vitals/Pain Indicates pain in abdomen 8-9/10 after mobility.  RN made aware.        Mobility  Bed Mobility Bed Mobility: Supine to Sit;Sitting - Scoot to Edge of Bed;Sit to Supine Supine to Sit: 1: +2 Total assist Supine to Sit: Patient Percentage: 50% Sitting - Scoot to Edge of Bed: 3: Mod assist Sit to Supine: 1: +2 Total assist Sit to Supine: Patient Percentage: 60% Details for Bed Mobility Assistance: cues for sequencing, encouragement.   Transfers Transfers: Not assessed Ambulation/Gait Ambulation/Gait Assistance: Not tested (comment) Stairs: No Wheelchair Mobility Wheelchair Mobility: No    Exercises     PT Diagnosis: Difficulty walking;Generalized weakness  PT Problem List: Decreased strength;Decreased activity tolerance;Decreased balance;Decreased mobility;Decreased knowledge of use of DME;Cardiopulmonary status limiting activity;Pain PT Treatment Interventions: DME instruction;Gait training;Stair training;Functional mobility  training;Therapeutic activities;Therapeutic exercise;Balance training;Patient/family education   PT Goals Acute Rehab PT Goals PT Goal Formulation: With patient Time For Goal Achievement: 07/21/12 Potential to Achieve Goals: Good Pt will go Supine/Side to Sit: with modified independence PT Goal: Supine/Side to Sit - Progress: Goal set today Pt will go Sit to Supine/Side: with modified independence PT Goal: Sit to Supine/Side - Progress: Goal set today Pt will go Sit to Stand: with modified independence PT Goal: Sit to Stand - Progress: Goal set today Pt will go Stand to Sit: with modified independence PT Goal: Stand to Sit - Progress: Goal set today Pt will Ambulate: >150 feet;with supervision;with rolling walker PT Goal: Ambulate - Progress: Goal set today Pt will Go Up / Down Stairs: 3-5 stairs;with min assist;with least restrictive assistive device PT Goal: Up/Down Stairs - Progress: Goal set today  Visit Information  Last PT Received On: 07/07/12 Assistance Needed: +2    Subjective Data  Subjective: I'm just so weak right now.   Patient Stated Goal: Home   Prior Functioning  Home Living Lives With: Spouse Available Help at Discharge: Family;Available PRN/intermittently Type of Home: House Home Access: Ramped entrance Home Layout: One level Bathroom Shower/Tub: Chiropodist: Handicapped height Bathroom Accessibility: Yes How Accessible: Accessible via walker [place]: Shower chair with back;Walker - rolling;Walker - four wheeled;Wheelchair - manual Prior Function Level of Independence: Independent Able to Take Stairs?: Yes Vocation: On disability Communication Communication: No difficulties    Cognition  Cognition Overall Cognitive Status: Appears within functional limits for tasks assessed/performed Arousal/Alertness: Awake/alert Orientation Level: Appears intact for tasks assessed Behavior During Session: [facility] for tasks  performed    Extremity/Trunk Assessment Right Lower Extremity Assessment RLE ROM/Strength/Tone: Deficits  RLE ROM/Strength/Tone Deficits: Generally weak.   RLE Sensation: History of peripheral neuropathy Left Lower Extremity Assessment LLE ROM/Strength/Tone: Deficits LLE ROM/Strength/Tone Deficits: pt with L BKA, generally weak.   LLE Sensation: WFL - Light Touch Trunk Assessment Trunk Assessment: Kyphotic   Balance Balance Balance Assessed: Yes Static Sitting Balance Static Sitting - Balance Support: Bilateral upper extremity supported;Feet supported Static Sitting - Level of Assistance: 5: Stand by assistance Static Sitting - Comment/# of Minutes: pt able to sit EOB with S, but needs UE support.  pt sat ~67mins.    End of Session PT - End of Session Equipment Utilized During Treatment: Oxygen Activity Tolerance: Patient limited by fatigue Patient left: in bed;with call bell/phone within reach;with nursing in room;with family/visitor present Nurse Communication: Mobility status  GP     Catarina Hartshorn, [place] 07/07/2012, 2:02 PM

## 2012-07-07 NOTE — Progress Notes (Signed)
Coffee ground residue on NG intermittent suction   Stop Heparin sq, protonix drip, pt, inr, ptt; cbcs q6h X3

## 2012-07-07 NOTE — Progress Notes (Signed)
Rehab Admissions Coordinator Note:  Patient was screened by Cleatrice Burke for appropriateness for an Inpatient Acute Rehab Consult.  At this time, we are recommending OT eval as well as IP rehab consult. Please order.Cleatrice Burke 07/07/2012, 7:17 PM  I can be reached at [phone].

## 2012-07-07 NOTE — Consult Note (Signed)
[name]    Date of Admission:  07/03/2012   Day 5 IV vancomycin          Day 5 cefepime        Day 5 metronidazole        Day 5 oseltamivir        Day 3 oral vancomycin       Reason for Consult: Severe diarrhea and sepsis   Referring Physician: Dr. Doree Fudge  Principal Problem:   Diarrhea Active Problems:   Fever and chills   AKI (acute kidney injury)   Acute respiratory failure   Septic shock   History of renal transplant   Obstructive sleep apnea   Anemia   Diabetes mellitus   Nausea   Leukocytosis   Metabolic acidosis   . antiseptic oral rinse  15 mL Mouth Rinse QID  . aspirin  81 mg Oral Daily  . chlorhexidine  15 mL Mouth Rinse BID  . cycloSPORINE  175 mg Oral BID  . hydrocortisone sodium succinate  50 mg Intravenous Q6H  . insulin aspart  0-15 Units Subcutaneous Q4H  . insulin glargine  10 Units Subcutaneous QHS  . levothyroxine  88 mcg Oral QAC breakfast  . sodium chloride  3 mL Intravenous Q12H    Recommendations: 1. Discontinue current antimicrobials and observe   Assessment: It is not clear what caused his acute diarrheal illness and sepsis. He may have had bacterial gastroenteritis that is now improved with time and empiric antibiotic therapy. It does not appear to be do to C. difficile colitis. This far out from transplant he is much less likely to have opportunistic infection such as CMV. As he is doing much better and there is no obvious pathogen to target I would favor stopping antimicrobial therapy and observing for now.    HPI: Dale [name] is a 47 y.o. male who underwent a remote renal transplant. He was hospitalized last month for chest pain. He was feeling well after discharge but then developed diffuse myalgias and thought he had a flulike illness. He then developed acute nausea, vomiting and severe diarrhea. He was admitted to the hospital on March 11 with fever, hypotension, acute on chronic  renal insufficiency and respiratory failure. A noncontrasted CT scan showed thickened small bowel and proximal colon. He was started on broad empiric antibiotic therapy. Blood urine and sputum cultures are all negative. C. difficile toxin PCR is negative x2. No stool cultures were obtained. Influenza PCR was negative. He has improved after 5 days of broad antibiotic therapy and is now extubated and feeling much better. He has not been around any sick contacts with a similar illness that he knows of.   Review of Systems: Pertinent items are noted in HPI.  Past Medical History  Diagnosis Date  . Insulin dependent diabetes mellitus   . Hypertension   . History of blood transfusion   . ESRD (end stage renal disease)     a. 1995 s/p cadaveric transplant w/ susbequent failure after 18 yrs;  b.Dialysis initiated 07/2011  . Dyspnea     a. 07/23/11 Echo: EF 55-60%  . Anemia of chronic disease   . Cholecystitis     a. 08/27/2011  . Constipation   . Pericardial effusion     a.  Small by CT 08/22/11;  b.  Large by CT 08/27/11  . Inguinal lymphadenopathy     a. bilateral - s/p biopsy  07/2011    History  Substance Use Topics  . Smoking status: Never Smoker   . Smokeless tobacco: Never Used  . Alcohol Use: Yes     Comment: occasionally 1xmonth    Family History  Problem Relation Age of Onset  . Malignant hyperthermia Neg Hx   . Colon polyps Father   . Diabetes Father   . Cancer Father   . Breast cancer Maternal Aunt   . Cancer Maternal Aunt     Breast and Bone   Allergies  Allergen Reactions  . Morphine And Related Hives and Nausea Only  . Amoxicillin Other (See Comments)    Reaction unknown  . Ciprofloxacin Other (See Comments)    Reaction unknown  . Clindamycin/Lincomycin Other (See Comments)    Reaction unknown  . Codeine Other (See Comments)    Reaction unknown. But pt has tolerated hydrocodone in Vicodin and Norco in the past  . Levofloxacin Other (See Comments)    Reaction  unknown  . Vasotec Cough  . Oxycodone Rash    OBJECTIVE: Blood pressure 162/74, pulse 84, temperature 98.7 F (37.1 C), temperature source Oral, resp. rate 17, height 5\' 8"  (1.727 m), weight 121.2 kg (267 lb 3.2 oz), SpO2 100.00%. General: Alert and joking while watching the [employer] basketball tournament on television Skin: No rash Lungs: Clear Cor: Regular S1 and S2 no murmurs Abdomen: Obese, soft and nontender. He is very quiet bowel sounds. He has no tenderness over his transplant kidney in his left lower quadrant Extremities: His right arm graft appears normal. He has had previous left BKA  Microbiology: Recent Results (from the past 240 hour(s))  CULTURE, BLOOD (ROUTINE X 2)     Status: None   Collection Time    07/03/12  1:50 PM      Result Value Range Status   Specimen Description BLOOD LEFT FOREARM   Final   Special Requests BOTTLES DRAWN AEROBIC ONLY 10CC   Final   Culture  Setup Time 07/04/2012 04:59   Final   Culture     Final   Value:        BLOOD CULTURE RECEIVED NO GROWTH TO DATE CULTURE WILL BE HELD FOR 5 DAYS BEFORE ISSUING A FINAL NEGATIVE REPORT   Report Status PENDING   Incomplete  URINE CULTURE     Status: None   Collection Time    07/03/12  2:51 PM      Result Value Range Status   Specimen Description URINE, CLEAN CATCH   Final   Special Requests Immunocompromised   Final   Culture  Setup Time 07/03/2012 15:45   Final   Colony Count NO GROWTH   Final   Culture NO GROWTH   Final   Report Status 07/05/2012 FINAL   Final  CLOSTRIDIUM DIFFICILE BY PCR     Status: None   Collection Time    07/03/12  3:54 PM      Result Value Range Status   C difficile by pcr NEGATIVE  NEGATIVE Final  CULTURE, BLOOD (ROUTINE X 2)     Status: None   Collection Time    07/03/12  4:20 PM      Result Value Range Status   Specimen Description BLOOD HAND LEFT   Final   Special Requests BOTTLES DRAWN AEROBIC AND ANAEROBIC 10CC   Final   Culture  Setup Time 07/04/2012 02:22   Final     Culture     Final   Value:  BLOOD CULTURE RECEIVED NO GROWTH TO DATE CULTURE WILL BE HELD FOR 5 DAYS BEFORE ISSUING A FINAL NEGATIVE REPORT   Report Status PENDING   Incomplete  URINE CULTURE     Status: None   Collection Time    07/04/12 12:17 PM      Result Value Range Status   Specimen Description URINE, CATHETERIZED   Final   Special Requests NONE   Final   Culture  Setup Time 07/05/2012 02:41   Final   Colony Count NO GROWTH   Final   Culture NO GROWTH   Final   Report Status 07/05/2012 FINAL   Final  CULTURE, RESPIRATORY (NON-EXPECTORATED)     Status: None   Collection Time    07/04/12 12:20 PM      Result Value Range Status   Specimen Description TRACHEAL ASPIRATE   Final   Special Requests NONE   Final   Gram Stain     Final   Value: RARE WBC PRESENT, PREDOMINANTLY MONONUCLEAR     RARE SQUAMOUS EPITHELIAL CELLS PRESENT     NO ORGANISMS SEEN   Culture NO GROWTH 2 DAYS   Final   Report Status 07/07/2012 FINAL   Final  CLOSTRIDIUM DIFFICILE BY PCR     Status: None   Collection Time    07/04/12  4:08 PM      Result Value Range Status   C difficile by pcr NEGATIVE  NEGATIVE Final  CULTURE, BLOOD (ROUTINE X 2)     Status: None   Collection Time    07/04/12  5:30 PM      Result Value Range Status   Specimen Description BLOOD HAND LEFT   Final   Special Requests BOTTLES DRAWN AEROBIC ONLY 5CC   Final   Culture  Setup Time 07/05/2012 03:29   Final   Culture     Final   Value:        BLOOD CULTURE RECEIVED NO GROWTH TO DATE CULTURE WILL BE HELD FOR 5 DAYS BEFORE ISSUING A FINAL NEGATIVE REPORT   Report Status PENDING   Incomplete  CULTURE, BLOOD (ROUTINE X 2)     Status: None   Collection Time    07/04/12  8:14 PM      Result Value Range Status   Specimen Description BLOOD LEFT HAND   Final   Special Requests BOTTLES DRAWN AEROBIC ONLY 4CC   Final   Culture  Setup Time 07/05/2012 02:42   Final   Culture     Final   Value:        BLOOD CULTURE RECEIVED NO GROWTH  TO DATE CULTURE WILL BE HELD FOR 5 DAYS BEFORE ISSUING A FINAL NEGATIVE REPORT   Report Status PENDING   Incomplete    Michel Bickers, MD [facility] [phone] pager   [phone] cell 07/07/2012, 4:23 PM

## 2012-07-07 NOTE — Progress Notes (Signed)
Subjective: Pt  Extubated yesterday.  He states his abd pain is better.  Con't with NGT and stools. Hgb down some overnight, no blood in NGT or rectal bag  Objective: Vital signs in last 24 hours: Temp:  [97.5 F (36.4 C)-98.5 F (36.9 C)] 97.5 F (36.4 C) (03/14 2100) Pulse Rate:  [80-95] 86 (03/15 0900) Resp:  [15-23] 17 (03/15 0900) BP: (125-170)/(63-65) 168/64 mmHg (03/14 1800) SpO2:  [100 %] 100 % (03/15 0900) Arterial Line BP: (146-193)/(59-78) 162/61 mmHg (03/15 0900) FiO2 (%):  [40 %] 40 % (03/14 1100) Weight:  [267 lb 3.2 oz (121.2 kg)] 267 lb 3.2 oz (121.2 kg) (03/15 0400) Last BM Date: 07/05/12  Intake/Output from previous day: 03/14 0701 - 03/15 0700 In: 1145.7 [I.V.:495.7; NG/GT:400; IV Piggyback:250] Out: 1265 [Urine:615; Emesis/NG output:450; Stool:200] Intake/Output this shift: Total I/O In: 40 [I.V.:40] Out: 125 [Urine:125]  General appearance: alert and cooperative GI: soft, mind ttp, nd, active BS, no rebound/ guarding  Lab Results:   Recent Labs  07/06/12 0416 07/06/12 1026 07/07/12 0410  WBC 7.6  --  9.2  HGB 9.4* 11.3* 9.2*  HCT 28.3* 33.4* 28.0*  PLT PLATELET CLUMPS NOTED ON SMEAR, COUNT APPEARS DECREASED  --  PLATELET CLUMPS NOTED ON SMEAR, COUNT APPEARS DECREASED   BMET  Recent Labs  07/06/12 0830 07/07/12 0411  NA 137 139  K 4.3 3.5  CL 98 101  CO2 21 22  GLUCOSE 268* 186*  BUN 55* 71*  CREATININE 5.63* 6.27*  CALCIUM 7.6* 7.9*   PT/INR  Recent Labs  07/04/12 1026  LABPROT 18.8*  INR 1.63*   ABG  Recent Labs  07/06/12 0419 07/06/12 0447  PHART 7.412 7.386  HCO3 22.1 19.9*    Studies/Results: Dg Chest Port 1 View  07/06/2012  *RADIOLOGY REPORT*  Clinical Data: Endotracheal tube assessment.  Diabetes. Respiratory failure.  PORTABLE CHEST - 1 VIEW  Comparison: 07/05/2012  Findings: Endotracheal tube tip is 2.8 cm above the carina.  Left central line tip projects over the SVC.  Nasogastric tube extends into the  stomach.  Low lung volumes noted with cardiomegaly.  Linear subsegmental atelectasis observed in the lingula.  Indistinct pulmonary vasculature noted.  IMPRESSION:  1.  ET tube tip 2.8 cm above carina. 2.  Cardiomegaly with indistinct pulmonary vasculature favoring pulmonary venous hypertension. 3.  Subsegmental atelectasis in the lingula.   Original Report Authenticated By: Van Clines, M.D.     Anti-infectives: Anti-infectives   Start     Dose/Rate Route Frequency Ordered Stop   07/05/12 2100  vancomycin (VANCOCIN) 750 mg in sodium chloride 0.9 % 150 mL IVPB     750 mg 150 mL/hr over 60 Minutes Intravenous  Once 07/05/12 2029 07/05/12 2200   07/05/12 1800  vancomycin (VANCOCIN) 50 mg/mL oral solution 500 mg     500 mg Per Tube 4 times per day 07/05/12 1511     07/05/12 1600  vancomycin (VANCOCIN) 1,750 mg in sodium chloride 0.9 % 500 mL IVPB  Status:  Discontinued     1,750 mg 250 mL/hr over 120 Minutes Intravenous Every 48 hours 07/03/12 1541 07/03/12 1918   07/04/12 1700  vancomycin (VANCOCIN) IVPB 1000 mg/200 mL premix     1,000 mg 200 mL/hr over 60 Minutes Intravenous  Once 07/04/12 1648 07/04/12 1851   07/04/12 1600  ceFEPIme (MAXIPIME) 1 g in dextrose 5 % 50 mL IVPB     1 g 100 mL/hr over 30 Minutes Intravenous Every 24 hours 07/04/12 0858  07/04/12 0900  metroNIDAZOLE (FLAGYL) IVPB 500 mg     500 mg 100 mL/hr over 60 Minutes Intravenous Every 8 hours 07/04/12 0849     07/04/12 0000  vancomycin (VANCOCIN) 50 mg/mL oral solution 250 mg  Status:  Discontinued     250 mg Oral 4 times per day 07/03/12 1918 07/04/12 0849   07/03/12 2200  metroNIDAZOLE (FLAGYL) tablet 500 mg  Status:  Discontinued     500 mg Oral 3 times per day 07/03/12 1918 07/04/12 0849   07/03/12 1800  vancomycin (VANCOCIN) 50 mg/mL oral solution 125 mg  Status:  Discontinued     125 mg Oral 4 times per day 07/03/12 1537 07/03/12 1918   07/03/12 1700  oseltamivir (TAMIFLU) capsule 75 mg     75 mg Oral  Daily 07/03/12 1540 07/08/12 0959   07/03/12 1530  vancomycin (VANCOCIN) 2,000 mg in sodium chloride 0.9 % 500 mL IVPB     2,000 mg 250 mL/hr over 120 Minutes Intravenous  Once 07/03/12 1526 07/03/12 1913   07/03/12 1530  ceFEPIme (MAXIPIME) 1 g in dextrose 5 % 50 mL IVPB  Status:  Discontinued     1 g 100 mL/hr over 30 Minutes Intravenous Every 24 hours 07/03/12 1526 07/03/12 1918      Assessment/Plan: s/p * No surgery found * NGT only 450cc overnight may consider clamping NGT and see how his abd does Con't with liquid stool On protonix gtt for ?GIB  Will con't to follow, no surgical plans    LOS: 4 days    Rosario Jacks., [facility] 07/07/2012

## 2012-07-07 NOTE — Progress Notes (Signed)
Hyperglycemia   Transitioned to phase 3 hyperglycemia protocol.

## 2012-07-08 LAB — CYCLOSPORINE LEVEL: Cyclosporine: 200 ng/mL (ref 140–330)

## 2012-07-08 LAB — BASIC METABOLIC PANEL
BUN: 59 mg/dL — ABNORMAL HIGH (ref 6–23)
CO2: 21 mEq/L (ref 19–32)
Calcium: 8.1 mg/dL — ABNORMAL LOW (ref 8.4–10.5)
Chloride: 99 mEq/L (ref 96–112)
Creatinine, Ser: 4.45 mg/dL — ABNORMAL HIGH (ref 0.50–1.35)
GFR calc Af Amer: 17 mL/min — ABNORMAL LOW (ref >90)
GFR calc non Af Amer: 15 mL/min — ABNORMAL LOW (ref >90)
Glucose, Bld: 282 mg/dL — ABNORMAL HIGH (ref 70–99)
Potassium: 3.6 mEq/L (ref 3.5–5.1)
Sodium: 134 mEq/L — ABNORMAL LOW (ref 135–145)

## 2012-07-08 LAB — APTT: aPTT: 28 seconds (ref 24–37)

## 2012-07-08 LAB — CBC
HCT: 25 % — ABNORMAL LOW (ref 39.0–52.0)
Hemoglobin: 8.6 g/dL — ABNORMAL LOW (ref 13.0–17.0)
MCH: 32.2 pg (ref 26.0–34.0)
MCHC: 34.4 g/dL (ref 30.0–36.0)
MCV: 93.6 fL (ref 78.0–100.0)
Platelets: 131 10*3/uL — ABNORMAL LOW (ref 150–400)
RBC: 2.67 MIL/uL — ABNORMAL LOW (ref 4.22–5.81)
RDW: 16 % — ABNORMAL HIGH (ref 11.5–15.5)
WBC: 8 10*3/uL (ref 4.0–10.5)

## 2012-07-08 LAB — GLUCOSE, CAPILLARY
Glucose-Capillary: 253 mg/dL — ABNORMAL HIGH (ref 70–99)
Glucose-Capillary: 264 mg/dL — ABNORMAL HIGH (ref 70–99)
Glucose-Capillary: 170 mg/dL — ABNORMAL HIGH (ref 70–99)
Glucose-Capillary: 246 mg/dL — ABNORMAL HIGH (ref 70–99)
Glucose-Capillary: 348 mg/dL — ABNORMAL HIGH (ref 70–99)
Glucose-Capillary: 323 mg/dL — ABNORMAL HIGH (ref 70–99)

## 2012-07-08 LAB — PROTIME-INR
INR: 1.14 (ref 0.00–1.49)
Prothrombin Time: 14.4 seconds (ref 11.6–15.2)

## 2012-07-08 MED ORDER — CARVEDILOL 6.25 MG PO TABS
6.2500 mg | ORAL_TABLET | Freq: Two times a day (BID) | ORAL | Status: DC
  Administered 2012-07-08 – 2012-07-12 (×8): 6.25 mg via ORAL
  Filled 2012-07-08 (×10): qty 1

## 2012-07-08 MED ORDER — HYDROMORPHONE HCL PF 1 MG/ML IJ SOLN
1.0000 mg | INTRAMUSCULAR | Status: DC | PRN
Start: 2012-07-08 — End: 2012-07-14
  Administered 2012-07-08 – 2012-07-14 (×32): 1 mg via INTRAVENOUS
  Filled 2012-07-08 (×29): qty 1

## 2012-07-08 MED ORDER — HYDROMORPHONE HCL 2 MG PO TABS
1.0000 mg | ORAL_TABLET | ORAL | Status: DC | PRN
  Filled 2012-07-08: qty 1

## 2012-07-08 MED ORDER — HYDROCORTISONE SOD SUCCINATE 100 MG IJ SOLR
50.0000 mg | Freq: Two times a day (BID) | INTRAMUSCULAR | Status: DC
  Administered 2012-07-08: 21:00:00 via INTRAVENOUS
  Filled 2012-07-08 (×3): qty 1

## 2012-07-08 MED ORDER — FAMOTIDINE 20 MG PO TABS
20.0000 mg | ORAL_TABLET | Freq: Every day | ORAL | Status: DC
  Administered 2012-07-08 – 2012-07-14 (×7): 20 mg via ORAL
  Filled 2012-07-08 (×7): qty 1

## 2012-07-08 MED ORDER — FAMOTIDINE 20 MG PO TABS: 20.0000 mg | ORAL_TABLET | Freq: Two times a day (BID) | ORAL | Status: DC

## 2012-07-08 NOTE — Progress Notes (Signed)
Patient ID: Dale [name], male   DOB: 1965-10-23, 47 y.o.   MRN: [id number]   LOS: 5 days   Subjective: Doing ok. Had some nausea after clear breakfast this am. Still with general abd pain.   Objective: Vital signs in last 24 hours: Temp:  [97.1 F (36.2 C)-98.9 F (37.2 C)] 97.8 F (36.6 C) (03/16 0754) Pulse Rate:  [79-88] 86 (03/16 0900) Resp:  [15-24] 17 (03/16 0900) BP: (112-162)/(54-107) 160/73 mmHg (03/16 0900) SpO2:  [93 %-100 %] 97 % (03/16 0900) Arterial Line BP: (142-183)/(59-75) 172/75 mmHg (03/16 0900) Weight:  [261 lb 7.5 oz (118.6 kg)-264 lb 8.8 oz (120 kg)] 261 lb 7.5 oz (118.6 kg) (03/15 1952) Last BM Date: 07/08/12   Laboratory  CBC  Recent Labs  07/07/12 1725 07/08/12  WBC 8.5 8.0  HGB 9.3* 8.6*  HCT 28.4* 25.0*  PLT 114* 131*    Physical Exam General appearance: alert and mild distress Resp: rales bilaterally Cardio: regular rate and rhythm GI: Soft, +BS, mild diffuse TTP   Assessment/Plan: Patient did well with NGT clamped for 24h, now just removed. Monitor tolerance to diet and advance as able.   Lisette Abu, PA-C Pager: [phone]   07/08/2012

## 2012-07-08 NOTE — Progress Notes (Signed)
Patient ID: Dale [name], male   DOB: 08-Apr-1966, 47 y.o.   MRN: [id number]         [facility] for Infectious Disease    Date of Admission:  07/03/2012       Principal Problem:   Diarrhea Active Problems:   Fever and chills   AKI (acute kidney injury)   Acute respiratory failure   Septic shock   History of renal transplant   Obstructive sleep apnea   Anemia   Diabetes mellitus   Nausea   Leukocytosis   Metabolic acidosis   . antiseptic oral rinse  15 mL Mouth Rinse QID  . aspirin  81 mg Oral Daily  . carvedilol  6.25 mg Oral BID WC  . chlorhexidine  15 mL Mouth Rinse BID  . cycloSPORINE  175 mg Oral BID  . famotidine  20 mg Oral Q1200  . hydrocortisone sodium succinate  50 mg Intravenous Q12H  . insulin aspart  0-15 Units Subcutaneous Q4H  . insulin glargine  10 Units Subcutaneous QHS  . levothyroxine  88 mcg Oral QAC breakfast  . sodium chloride  3 mL Intravenous Q12H    Subjective: Overall he feels better but he has a little bit nauseated now after trying some tomato soup.  Objective: Temp:  [97.1 F (36.2 C)-98.9 F (37.2 C)] 97.8 F (36.6 C) (03/16 0754) Pulse Rate:  [79-91] 82 (03/16 1110) Resp:  [16-24] 21 (03/16 1110) BP: (112-162)/(54-107) 148/70 mmHg (03/16 1110) SpO2:  [84 %-100 %] 97 % (03/16 1110) Arterial Line BP: (134-183)/(59-89) 183/74 mmHg (03/16 1030) Weight:  [118.6 kg (261 lb 7.5 oz)-120 kg (264 lb 8.8 oz)] 118.6 kg (261 lb 7.5 oz) (03/15 1952)  General: A little uncomfortable secondary to nausea. Sitting up in the chair visiting with family. Skin: No rash Lungs: Clear Cor: Regular S1 and S2 no murmurs Abdomen: Soft, distended with mild diffuse tenderness and diminished bowel sounds  Lab Results Lab Results  Component Value Date   WBC 8.0 07/08/2012   HGB 8.6* 07/08/2012   HCT 25.0* 07/08/2012   MCV 93.6 07/08/2012   PLT 131* 07/08/2012    Lab Results  Component Value Date   CREATININE 4.45* 07/08/2012   BUN 59* 07/08/2012   NA  134* 07/08/2012   K 3.6 07/08/2012   CL 99 07/08/2012   CO2 21 07/08/2012   Assessment: He is recovering slowly from severe, acute gastroenteritis of unknown cause.  Plan: 1. Continue observation off of antibiotics  Michel Bickers, MD [facility] for [place] [phone] pager   [phone] cell 07/08/2012, 12:31 PM

## 2012-07-08 NOTE — Progress Notes (Signed)
Cross Cover [facility] Subjective:  Since I last evaluated the patient, he seems to be doing a little better. He continues to have diffuse abdominal pain requiring narcotics.    Objective: Vital signs in last 24 hours: Temp:  [97.1 F (36.2 C)-98.9 F (37.2 C)] 97.6 F (36.4 C) (03/16 1200) Pulse Rate:  [79-91] 79 (03/16 1300) Resp:  [16-24] 17 (03/16 1300) BP: (112-162)/(54-107) 149/67 mmHg (03/16 1300) SpO2:  [84 %-100 %] 99 % (03/16 1300) Arterial Line BP: (134-183)/(59-89) 183/74 mmHg (03/16 1030) Weight:  [118.6 kg (261 lb 7.5 oz)-120 kg (264 lb 8.8 oz)] 118.6 kg (261 lb 7.5 oz) (03/15 1952) Last BM Date: 07/08/12  Intake/Output from previous day: 03/15 0701 - 03/16 0700 In: 580 [P.O.:120; I.V.:360; IV Piggyback:100] Out: 2065 [Urine:635] Intake/Output this shift: Total I/O In: 960 [P.O.:840; I.V.:120] Out: 876 [Urine:250; Other:300; Stool:326]  General appearance: alert, cooperative, appears older than stated age, mild distress, morbidly obese and pale Resp: clear to auscultation bilaterally Cardio: regular rate and rhythm, S1, S2 normal, no murmur, click, rub or gallop GI: soft, morbidly obese, diffusely tender; bowel sounds normal; no masses,  no organomegaly  Lab Results:  Recent Labs  07/07/12 1147 07/07/12 1725 07/08/12  WBC 9.4 8.5 8.0  HGB 9.4* 9.3* 8.6*  HCT 28.3* 28.4* 25.0*  PLT 126* 114* 131*   BMET  Recent Labs  07/06/12 0830 07/07/12 0411 07/08/12 1050  NA 137 139 134*  K 4.3 3.5 3.6  CL 98 101 99  CO2 21 22 21   GLUCOSE 268* 186* 282*  BUN 55* 71* 59*  CREATININE 5.63* 6.27* 4.45*  CALCIUM 7.6* 7.9* 8.1*   LFT  Recent Labs  07/07/12 0411  ALBUMIN 2.0*   PT/INR  Recent Labs  07/08/12  LABPROT 14.4  INR 1.14   Medications: I have reviewed the patient's current medications.  Assessment/Plan: 1) Diarrhea with diffuse abdominal pain: as discussed with [name], NP plans are to d/c his NG and rectal tube and move him to a chair.  He has been advised tio drink small volumes of fluids as he has gastroparesis. [facility] will re-evaluate in AM.  2) Anemia: monitor CBC's  closely.  3) RF on HD  LOS: 5 days   Dale [name] 07/08/2012, 1:28 PM

## 2012-07-08 NOTE — Progress Notes (Signed)
Pt requesting pain medication, no PRN medicine ordered, and pt does not have IV access at this time. Notified MD. Will continue to monitor.

## 2012-07-08 NOTE — Progress Notes (Signed)
PULMONARY  / CRITICAL CARE MEDICINE  Name: Dale [name] MRN: [id number] DOB: 1966/02/10    ADMISSION DATE:  07/03/2012 CONSULTATION DATE:  07/04/2012  REFERRING MD :  TRH  CHIEF COMPLAINT:  Respiratory failure and septic shock.  BRIEF PATIENT DESCRIPTION: 47 yo with DM1, HTN and ESRD s/p transplant (failing) admitted with shock in respiratory failure in setting of abdominal pain / distension, diarrhea and dehydration.   SIGNIFICANT EVENTS / STUDIES:  3/12 VDRF and septic shock 3/12 CT abd/pelvis >>> Circumferential thickening of distal small bowel and proximal colon - enterocolitis vs ischemia 3/15 ID consult >>>d/c abx  3/15 GI consult >>>consider flex sig  LINES / TUBES: L IJ TLC 3/12 >>> ETT 3/12 >>> 3/14 Foley 3/12 >>>3/16 NGT 3/12 >>>3/16  CULTURES: 3/12  Blood >>> 3/12  Urine >>> neg  3/12  Sputum  >>> neg  3/12  C. diff >>> neg  3/13  C diff >>> neg 3/11  Influz  >>> neg  3/13  CMV  >>>  ANTIBIOTICS: Vanc IV 3/12 >>>3/15 Vanc PO 3/13 >>>3/15 Flagyl 3/11 >>>3/15 Cefepime 3/11 >>>3/15 Tamiflu 3/10 >>> 3/15  SUBJECTIVE:  Seen by GI yesterday for persistent diarrhea , consider flex sig if not improving >?Ischemic colitis vs bacterial colitis . ID consult yesterday , no further abx at time. Pt tolerating clear liquid diet but nausea and bloating this am. For HD in am.   VITAL SIGNS: Temp:  [97.1 F (36.2 C)-98.9 F (37.2 C)] 97.8 F (36.6 C) (03/16 0754) Pulse Rate:  [79-88] 83 (03/16 0800) Resp:  [15-24] 16 (03/16 0800) BP: (112-162)/(54-107) 126/107 mmHg (03/16 0800) SpO2:  [93 %-100 %] 96 % (03/16 0800) Arterial Line BP: (142-183)/(59-74) 175/70 mmHg (03/16 0800) Weight:  [118.6 kg (261 lb 7.5 oz)-120 kg (264 lb 8.8 oz)] 118.6 kg (261 lb 7.5 oz) (03/15 1952)  HEMODYNAMICS:    VENTILATOR SETTINGS:   INTAKE / OUTPUT: Intake/Output     03/15 0701 - 03/16 0700 03/16 0701 - 03/17 0700   P.O. 120 840   I.V. (mL/kg) 360 (3) 40 (0.3)   NG/GT     IV Piggyback  100    Total Intake(mL/kg) 580 (4.9) 880 (7.4)   Urine (mL/kg/hr) 635 (0.2) 150 (0.6)   Emesis/NG output     Other 1430 (0.5)    Stool     Total Output 2065 150   Net -1485 +730         PHYSICAL EXAMINATION: General:  Chronically ill appearing male Neuro:  awake, follows commands, moves all ext HEENT: Dry mucosa  Cardiovascular:  regular, Nl S1/S2, -M/R/G. Lungs:  Diminshed BS in bases  Abdomen:  Soft, mild gen  tender, distended with BS +. Musculoskeletal:  L BKA, otherwise normal. Skin:  Bruising but otherwise intact.  LABS:  Recent Labs Lab 07/03/12 1323 07/03/12 1415  07/04/12 0635  07/04/12 1013 07/04/12 1026 07/04/12 1225  07/05/12 0355 07/05/12 0435  07/05/12 1821  07/06/12 0416 07/06/12 0419 07/06/12 0447 07/06/12 0830 07/06/12 1026 07/07/12 0410 07/07/12 0411 07/07/12 1147 07/07/12 1725 07/08/12  HGB 11.4*  --   < > 11.2*  --   --  10.4*  --   < > 9.8*  --   < > 9.0*  < > 9.4*  --   --   --  11.3* 9.2*  --  9.4* 9.3* 8.6*  WBC 18.4*  --   < > 10.6*  --   --  9.3  --   --  6.4  --   --   --   --  7.6  --   --   --   --  9.2  --  9.4 8.5 8.0  PLT 149*  --   < > 122*  --   --  PLATELET CLUMPS NOTED ON SMEAR, COUNT APPEARS DECREASED  --   --  PLATELET CLUMPS NOTED ON SMEAR, UNABLE TO ESTIMATE  --   --   --   --  PLATELET CLUMPS NOTED ON SMEAR, COUNT APPEARS DECREASED  --   --   --   --  PLATELET CLUMPS NOTED ON SMEAR, COUNT APPEARS DECREASED  --  126* 114* 131*  NA 132*  --   --  137  --   --  137  --   --  138  --   --  134*  --   --   --   --  137  --   --  139  --   --   --   K 3.8  --   --  3.1*  --   --  2.9*  --   --  2.7*  --   --  4.3  --   --   --   --  4.3  --   --  3.5  --   --   --   CL 96  --   --  100  --   --  102  --   --  101  --   --  98  --   --   --   --  98  --   --  101  --   --   --   CO2 18*  --   --  14*  --   --  17*  --   --  19  --   --  16*  --   --   --   --  21  --   --  22  --   --   --   GLUCOSE 188*  --   --  125*  --   --  90   --   --  115*  --   --  328*  --   --   --   --  268*  --   --  186*  --   --   --   BUN 110*  --   --  114*  --   --  98*  --   --  81*  --   --  87*  --   --   --   --  55*  --   --  71*  --   --   --   CREATININE 6.60*  --   < > 7.40*  --   --  6.62*  --   --  7.87*  --   --  8.52*  --   --   --   --  5.63*  --   --  6.27*  --   --   --   CALCIUM 8.9  --   --  8.3*  --   --  8.0*  --   --  7.7*  --   --  7.4*  --   --   --   --  7.6*  --   --  7.9*  --   --   --   MG  --   --   --   --   --   --   --   --   --  1.6  --   --   --   --   --   --   --   --  2.3 2.3  --   --   --   --   PHOS  --   --   < > 3.6  --   --   --   --   --  4.4  --   --   --   --   --   --   --  5.5*  --   --  5.6*  --   --   --   AST 19  --   --  42*  --   --  57*  --   --   --   --   --   --   --   --   --   --   --   --   --   --   --   --   --   ALT 10  --   --  12  --   --  13  --   --   --   --   --   --   --   --   --   --   --   --   --   --   --   --   --   ALKPHOS 67  --   --  59  --   --  51  --   --   --   --   --   --   --   --   --   --   --   --   --   --   --   --   --   BILITOT 0.4  --   --  0.4  --   --  0.4  --   --   --   --   --   --   --   --   --   --   --   --   --   --   --   --   --   PROT 7.3  --   --  6.9  --   --  6.5  --   --   --   --   --   --   --   --   --   --   --   --   --   --   --   --   --   ALBUMIN 2.3*  --   --  2.2*  --   --  2.0*  --   --   --   --   --   --   --   --   --   --  2.0*  --   --  2.0*  --   --   --   APTT  --   --   --   --   --   --  44*  --   --   --   --   --   --   --   --   --   --   --   --   --   --   --   --  28  INR  --   --   --   --   --   --  1.63*  --   --   --   --   --   --   --   --   --   --   --   --   --   --   --   --  1.14  LATICACIDVEN  --  2.73*  --   --   --  0.9  --   --   --   --   --   --   --   --   --   --   --   --   --   --   --   --   --   --   TROPONINI  --   --   --   --   --   --   --  0.60*  --   --   --   --   --   --   --   --    --   --   --   --   --   --   --   --   PROCALCITON  --   --   --   --   --  >175.00  --   --   --   --   --   --   --   --  >175.00  --   --   --   --   --   --   --   --   --   PHART  --   --   --   --   < >  --   --  7.431  --   --  7.434  --   --   --   --  7.412 7.386  --   --   --   --   --   --   --   PCO2ART  --   --   --   --   < >  --   --  30.4*  --   --  30.0*  --   --   --   --  34.6* 33.9*  --   --   --   --   --   --   --   PO2ART  --   --   --   --   < >  --   --  399.0*  --   --  173.0*  --   --   --   --  154.0* 160.0*  --   --   --   --   --   --   --   < > = values in this interval not displayed.  Recent Labs Lab 07/07/12 1141 07/07/12 1546 07/08/12 0104 07/08/12 0415 07/08/12 0728  GLUCAP 190* 227* 253* 264* 170*   CXR: None today  ASSESSMENT / PLAN:  PULMONARY A: Acute respiratory failure in setting of sepsis / abdominal distension, resolved, extubated. Hx of OSA-refuses CPAP/BIPAP (hx of intolerance in past )  P:   Goal SpO2>92 Supplemental oxygen  CARDIOVASCULAR A: Septic / hypovolemia shock, resolved. Hx of HTN, now trending up. P:  Restart Coreg  If remains elevated , restart norvasc in am  ASA.  RENAL A:  Acute on chronic renal failure. Hypokalemia. Failed renal transplant. Chronic immunosuppression with cyclosporin P:   HD per renal Trend BMP Replace K PRN  GASTROINTESTINAL A:  Colitis (ischemic vs CMC vs pseudomembranous). Appreciate GI consult. P:   D/c NGT and flexiseal Surgery following, no indication for OR at this time Vascular doppler abdomen / pelvis to evaluate for mesenteric ischemia >>>  Add Pepcid Twice daily   GI considering flex sig  HEMATOLOGIC A:  Leukocytosis from infection  on presentation, then leukopenia.>resolved    Anemia, stable. P:  Trend CBC  INFECTIOUS A: No clear source of infection.  Unclear how to interpret elevated PCT in setting of renal failure, immunocompromise, etc. Appreciate ID consult. P:    Remain off abx   ENDOCRINE A:  DM1. Chronic pred use and presumed adrenal insufficiency. Hypothyroidism. P:   SSI Lantus Stress dose Hydrocortisone, taper Synthroid  NEUROLOGIC A:  No active issues. P:   No intervention required  CLINICAL SUMMARY: 47 yo with DM1, HTN and ESRD s/p transplant (failing) admitted with shock in respiratory failure in setting of abdominal pain / distension, diarrhea and dehydration. Now extubated and off vasopressors.  HD per renal.  ABX stopped 3/15 per ID. Infectious vs ischemic colitis / enteritis. Advancing diet. Vascular doppler abdomen pending.  GI considering flex sig.  Renal floor today under [facility].    PARRETT,TAMMY NP- C  07/08/2012, 9:05 AM [facility]  [phone]  I have personally obtained a history, examined the patient, evaluated laboratory and imaging results, formulated the assessment and plan and placed orders.   Doree Fudge, MD Pulmonary and [place] Pager: [phone]  07/08/2012, 11:33 AM

## 2012-07-08 NOTE — Progress Notes (Signed)
Patient refusing PO pain medication, states would rather be in pain. Medication wasted, MD notified. IV access established. Will continue to monitor.

## 2012-07-08 NOTE — Progress Notes (Signed)
Pt arrived to unit accompanied by visitor and NT, belongings at bedside. Alert & oriented, skin warm & dry, NAD. Will continue to monitor.

## 2012-07-08 NOTE — Progress Notes (Signed)
Patient ID: Dale [name], male   DOB: 1965/11/02, 47 y.o.   MRN: [id number]   [facility] Progress Note    Subjective:   Reports to be feeling "fair"- inquires about removal of NGT. Dialysis stopped 25min early yesterday as of his lines clotted and he did not want it restarted    Objective:   BP 132/56  Pulse 80  Temp(Src) 97.8 F (36.6 C) (Oral)  Resp 17  Ht 5\' 8"  (1.727 m)  Wt 118.6 kg (261 lb 7.5 oz)  BMI 39.76 kg/m2  SpO2 95%  Intake/Output Summary (Last 24 hours) at 07/08/12 [id number] Last data filed at 07/08/12 0600  Gross per 24 hour  Intake    320 ml  Output   2065 ml  Net  -1745 ml   Weight change: -1.2 kg (-2 lb 10.3 oz)  Physical Exam: [id number] resting in bed- NGT in-situ [id number] RRR, normal S1 and S2  Resp:Decreased BS over bases- otherwise CTA [id number], obese, NT, BS normal [id number]  Imaging: No results found.  Labs: BMET  Recent Labs Lab 07/03/12 1323 07/03/12 2014 07/04/12 0635 07/04/12 1026 07/05/12 0355 07/05/12 1821 07/06/12 0830 07/07/12 0411  NA 132*  --  137 137 138 134* 137 139  K 3.8  --  3.1* 2.9* 2.7* 4.3 4.3 3.5  CL 96  --  100 102 101 98 98 101  CO2 18*  --  14* 17* 19 16* 21 22  GLUCOSE 188*  --  125* 90 115* 328* 268* 186*  BUN 110*  --  114* 98* 81* 87* 55* 71*  CREATININE 6.60* 6.89* 7.40* 6.62* 7.87* 8.52* 5.63* 6.27*  CALCIUM 8.9  --  8.3* 8.0* 7.7* 7.4* 7.6* 7.9*  PHOS  --   --  3.6  --  4.4  --  5.5* 5.6*   CBC  Recent Labs Lab 07/03/12 1323  07/07/12 0410 07/07/12 1147 07/07/12 1725 07/08/12  WBC 18.4*  < > 9.2 9.4 8.5 8.0  NEUTROABS 17.0*  --   --   --   --   --   HGB 11.4*  < > 9.2* 9.4* 9.3* 8.6*  HCT 34.7*  < > 28.0* 28.3* 28.4* 25.0*  MCV 97.2  < > 93.6 94.0 95.0 93.6  PLT 149*  < > PLATELET CLUMPS NOTED ON SMEAR, COUNT APPEARS DECREASED 126* 114* 131*  < > = values in this interval not displayed.  Medications:    . antiseptic oral rinse  15 mL Mouth Rinse QID  . aspirin  81 mg  Oral Daily  . chlorhexidine  15 mL Mouth Rinse BID  . cycloSPORINE  175 mg Oral BID  . hydrocortisone sodium succinate  50 mg Intravenous Q6H  . insulin aspart  0-15 Units Subcutaneous Q4H  . insulin glargine  10 Units Subcutaneous QHS  . levothyroxine  88 mcg Oral QAC breakfast  . sodium chloride  3 mL Intravenous Q12H     Assessment/ Plan:   1. Acute renal failure chronic kidney disease stage IV/V: Suspect evolution to dialysis dependent ESRD following significant renal injury. Will schedule for dialysis tomorrow (will need to start process for OP HD unit placement Monday)  2. Respiratory failure/sepsis: Suspected intra-abdominal pathology-possible ischemic bowel. ? Need to repeat CT abdomen/pelvis with IV contrast. Antibiotics stopped yesterday by ID. C difficile testing negative. GI plans noted with regards to diarrhea. 3. Anemia: Suspect underlying anemia of chronic kidney disease now compounded by acute illness, initiate ESA therapy 4. 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

## 2012-07-08 NOTE — Progress Notes (Signed)
16 Dr. Orlie Dakin made aware of lab results positive for gram neg rods. 1320 Report called to receiving nurse [phone]. Past medical history and present hospitalization progression reported.  All questions answered. Wife at bedside, patient and wife notified of new room transfer.  1340 transferred to room 6732 via wheelchair with all belongings and wife at side.

## 2012-07-09 DIAGNOSIS — R109 Unspecified abdominal pain: Secondary | ICD-10-CM

## 2012-07-09 DIAGNOSIS — K5289 Other specified noninfective gastroenteritis and colitis: Secondary | ICD-10-CM

## 2012-07-09 DIAGNOSIS — B9689 Other specified bacterial agents as the cause of diseases classified elsewhere: Secondary | ICD-10-CM

## 2012-07-09 DIAGNOSIS — R7881 Bacteremia: Secondary | ICD-10-CM | POA: Diagnosis present

## 2012-07-09 LAB — CBC
HCT: 27.3 % — ABNORMAL LOW (ref 39.0–52.0)
Hemoglobin: 9.3 g/dL — ABNORMAL LOW (ref 13.0–17.0)
MCH: 31.3 pg (ref 26.0–34.0)
MCHC: 34.1 g/dL (ref 30.0–36.0)
MCV: 91.9 fL (ref 78.0–100.0)
Platelets: 124 10*3/uL — ABNORMAL LOW (ref 150–400)
RBC: 2.97 MIL/uL — ABNORMAL LOW (ref 4.22–5.81)
RDW: 15.6 % — ABNORMAL HIGH (ref 11.5–15.5)
WBC: 5.7 10*3/uL (ref 4.0–10.5)

## 2012-07-09 LAB — RENAL FUNCTION PANEL
Albumin: 2.2 g/dL — ABNORMAL LOW (ref 3.5–5.2)
BUN: 69 mg/dL — ABNORMAL HIGH (ref 6–23)
CO2: 21 mEq/L (ref 19–32)
Calcium: 8.5 mg/dL (ref 8.4–10.5)
Chloride: 101 mEq/L (ref 96–112)
Creatinine, Ser: 4.64 mg/dL — ABNORMAL HIGH (ref 0.50–1.35)
GFR calc Af Amer: 16 mL/min — ABNORMAL LOW (ref >90)
GFR calc non Af Amer: 14 mL/min — ABNORMAL LOW (ref >90)
Glucose, Bld: 212 mg/dL — ABNORMAL HIGH (ref 70–99)
Phosphorus: 4.5 mg/dL (ref 2.3–4.6)
Potassium: 3.3 mEq/L — ABNORMAL LOW (ref 3.5–5.1)
Sodium: 135 mEq/L (ref 135–145)

## 2012-07-09 LAB — GLUCOSE, CAPILLARY
Glucose-Capillary: 104 mg/dL — ABNORMAL HIGH (ref 70–99)
Glucose-Capillary: 126 mg/dL — ABNORMAL HIGH (ref 70–99)
Glucose-Capillary: 164 mg/dL — ABNORMAL HIGH (ref 70–99)
Glucose-Capillary: 218 mg/dL — ABNORMAL HIGH (ref 70–99)
Glucose-Capillary: 230 mg/dL — ABNORMAL HIGH (ref 70–99)

## 2012-07-09 MED ORDER — ROPINIROLE HCL 1 MG PO TABS
1.0000 mg | ORAL_TABLET | Freq: Every day | ORAL | Status: DC
  Administered 2012-07-09 – 2012-07-13 (×5): 1 mg via ORAL
  Filled 2012-07-09 (×6): qty 1

## 2012-07-09 MED ORDER — CYCLOSPORINE MODIFIED (NEORAL) 100 MG PO CAPS
100.0000 mg | ORAL_CAPSULE | Freq: Two times a day (BID) | ORAL | Status: DC
  Administered 2012-07-09 – 2012-07-10 (×3): 100 mg via ORAL
  Filled 2012-07-09 (×4): qty 1

## 2012-07-09 MED ORDER — ALLOPURINOL 100 MG PO TABS
100.0000 mg | ORAL_TABLET | Freq: Every day | ORAL | Status: DC
  Administered 2012-07-09 – 2012-07-14 (×6): 100 mg via ORAL
  Filled 2012-07-09 (×6): qty 1

## 2012-07-09 MED ORDER — HEPARIN SODIUM (PORCINE) 1000 UNIT/ML DIALYSIS
3000.0000 [IU] | INTRAMUSCULAR | Status: DC | PRN
  Administered 2012-07-09: 3000 [IU] via INTRAVENOUS_CENTRAL

## 2012-07-09 MED ORDER — CALCIUM ACETATE 667 MG PO CAPS
667.0000 mg | ORAL_CAPSULE | Freq: Every day | ORAL | Status: DC
  Administered 2012-07-09 – 2012-07-12 (×13): 667 mg via ORAL
  Filled 2012-07-09 (×24): qty 1

## 2012-07-09 MED ORDER — HYDROMORPHONE HCL PF 1 MG/ML IJ SOLN
INTRAMUSCULAR | Status: AC
  Filled 2012-07-09: qty 1

## 2012-07-09 MED ORDER — INSULIN GLARGINE 100 UNIT/ML ~~LOC~~ SOLN
12.0000 [IU] | Freq: Two times a day (BID) | SUBCUTANEOUS | Status: DC
  Administered 2012-07-09 – 2012-07-12 (×7): 12 [IU] via SUBCUTANEOUS
  Filled 2012-07-09 (×7): qty 0.12

## 2012-07-09 MED ORDER — DEXTROSE 5 % IV SOLN
2.0000 g | Freq: Once | INTRAVENOUS | Status: AC
  Administered 2012-07-09: 2 g via INTRAVENOUS
  Filled 2012-07-09: qty 2

## 2012-07-09 MED ORDER — PREDNISONE 5 MG PO TABS
5.0000 mg | ORAL_TABLET | Freq: Every day | ORAL | Status: DC
  Administered 2012-07-10 – 2012-07-14 (×5): 5 mg via ORAL
  Filled 2012-07-09 (×7): qty 1

## 2012-07-09 MED ORDER — DEXTROSE 5 % IV SOLN
2.0000 g | INTRAVENOUS | Status: DC
  Administered 2012-07-11 – 2012-07-13 (×2): 2 g via INTRAVENOUS
  Filled 2012-07-09 (×3): qty 2

## 2012-07-09 NOTE — Procedures (Signed)
I have seen and examined this patient and agree with the plan of care . Seen on dialysis with no complications  Dale [name] W 07/09/2012, 8:08 AM

## 2012-07-09 NOTE — Progress Notes (Signed)
Inpatient Diabetes Program Recommendations  AACE/ADA: New Consensus Statement on Inpatient Glycemic Control (2013)  Target Ranges:  Prepandial:   less than 140 mg/dL      Peak postprandial:   less than 180 mg/dL (1-2 hours)      Critically ill patients:  140 - 180 mg/dL   Reason for Visit: Results for ASHYR, CUEN (MRN [id number]) as of 07/09/2012 12:57  Ref. Range 07/08/2012 16:54 07/08/2012 20:32 07/08/2012 23:45 07/09/2012 03:43 07/09/2012 11:09  Glucose-Capillary Latest Range: 70-99 mg/dL 348 (H) 323 (H) 104 (H) 126 (H) 164 (H)   Note patient was taken off insulin pump last week when transferred to ICU.  CBG's increased yesterday and required 35 units of Novolog correction plus he is on Lantus 10 units daily.  Insulin pump was providing 33.6 units of insulin per 24 hours of basal insulin.  Consider increasing Lantus to 12 units bid.  Send text Page to Dr. Marye Round.  Will follow.

## 2012-07-09 NOTE — Progress Notes (Signed)
Patient seen, examined, and I agree with the above documentation, including the assessment and plan. Patient known to me from May 2013 during hospitalization for right-sided abdominal pain and diarrhea. Colonoscopy at that point revealed purplish discoloration concerning for low blood flow to the right colon, biopsies confirmed ischemic change. He was also having diarrhea at that time, but slowly his diarrhea and abdominal pain resolved. In fact afterwards he was having some constipation which was mild This presentation was acute with flulike symptoms, diarrhea, and right-sided abdominal pain progressing to severe septic shock with respiratory failure requiring intubation. CT scan abdomen and pelvis is reviewed which revealed ileal inflammation/thickening consistent with enteritis as well as right-sided colitis. No studies were negative, single blood culture grew gram-negative rod he has been covered with ceftazidime Diarrhea has largely resolved, stools now soft but occurring only once to twice per day.  Only one stool thus far today He is still having abdominal pain, relieved by medications, but his exam is nontoxic and there is peritoneal signs Given the acuity of symptoms, infectious enterocolitis is felt to most likely which should continue to improve I cannot exclude an episode of ischemic injury given his history of prior biopsy findings, though usually with severe ischemic colitis there is some bleeding Would continue with supportive care, antibiotics, pain control If pain continues could consider repeat imaging. Colonoscopy at this point is likely low yield in making a diagnosis that would change our management Will follow

## 2012-07-09 NOTE — Progress Notes (Signed)
Patient ID: Dale [name], male   DOB: January 26, 1966, 47 y.o.   MRN: [id number]    Subjective: Pt still with some abdominal pain.  Tolerating his full liquids  Objective: Vital signs in last 24 hours: Temp:  [97 F (36.1 C)-98.1 F (36.7 C)] 97 F (36.1 C) (03/17 0645) Pulse Rate:  [76-91] 79 (03/17 0930) Resp:  [16-22] 16 (03/17 0659) BP: (128-163)/(59-82) 132/75 mmHg (03/17 0930) SpO2:  [84 %-99 %] 96 % (03/17 0645) Arterial Line BP: (134-183)/(66-89) 183/74 mmHg (03/16 1030) Weight:  [261 lb 7.5 oz (118.601 kg)-263 lb 0.1 oz (119.3 kg)] 263 lb 0.1 oz (119.3 kg) (03/17 0645) Last BM Date: 07/08/12  Intake/Output from previous day: 03/16 0701 - 03/17 0700 In: 1980 [P.O.:1860; I.V.:120] Out: 1077 [Urine:450; Stool:327] Intake/Output this shift:    PE: Abd: soft, mild diffuse tenderness, +BS, obese  Lab Results:   Recent Labs  07/08/12 07/09/12 0500  WBC 8.0 5.7  HGB 8.6* 9.3*  HCT 25.0* 27.3*  PLT 131* 124*   BMET  Recent Labs  07/08/12 1050 07/09/12 0500  NA 134* 135  K 3.6 3.3*  CL 99 101  CO2 21 21  GLUCOSE 282* 212*  BUN 59* 69*  CREATININE 4.45* 4.64*  CALCIUM 8.1* 8.5   PT/INR  Recent Labs  07/08/12  LABPROT 14.4  INR 1.14   CMP     Component Value Date/Time   NA 135 07/09/2012 0500   K 3.3* 07/09/2012 0500   CL 101 07/09/2012 0500   CO2 21 07/09/2012 0500   GLUCOSE 212* 07/09/2012 0500   BUN 69* 07/09/2012 0500   CREATININE 4.64* 07/09/2012 0500   CALCIUM 8.5 07/09/2012 0500   PROT 6.5 07/04/2012 1026   ALBUMIN 2.2* 07/09/2012 0500   AST 57* 07/04/2012 1026   ALT 13 07/04/2012 1026   ALKPHOS 51 07/04/2012 1026   BILITOT 0.4 07/04/2012 1026   GFRNONAA 14* 07/09/2012 0500   GFRAA 16* 07/09/2012 0500   Lipase     Component Value Date/Time   LIPASE 26 08/27/2011 0820       Studies/Results: No results found.  Anti-infectives: Anti-infectives   Start     Dose/Rate Route Frequency Ordered Stop   07/11/12 2000  cefTAZidime (FORTAZ) 2 g in dextrose  5 % 50 mL IVPB     2 g 100 mL/hr over 30 Minutes Intravenous Every M-W-F (2000) 07/09/12 0730     07/09/12 0730  cefTAZidime (FORTAZ) 2 g in dextrose 5 % 50 mL IVPB     2 g 100 mL/hr over 30 Minutes Intravenous  Once 07/09/12 0729     07/05/12 2100  vancomycin (VANCOCIN) 750 mg in sodium chloride 0.9 % 150 mL IVPB     750 mg 150 mL/hr over 60 Minutes Intravenous  Once 07/05/12 2029 07/05/12 2200   07/05/12 1800  vancomycin (VANCOCIN) 50 mg/mL oral solution 500 mg  Status:  Discontinued     500 mg Per Tube 4 times per day 07/05/12 1511 07/07/12 1617   07/05/12 1600  vancomycin (VANCOCIN) 1,750 mg in sodium chloride 0.9 % 500 mL IVPB  Status:  Discontinued     1,750 mg 250 mL/hr over 120 Minutes Intravenous Every 48 hours 07/03/12 1541 07/03/12 1918   07/04/12 1700  vancomycin (VANCOCIN) IVPB 1000 mg/200 mL premix     1,000 mg 200 mL/hr over 60 Minutes Intravenous  Once 07/04/12 1648 07/04/12 1851   07/04/12 1600  ceFEPIme (MAXIPIME) 1 g in dextrose 5 % 50 mL  IVPB  Status:  Discontinued     1 g 100 mL/hr over 30 Minutes Intravenous Every 24 hours 07/04/12 0858 07/07/12 1617   07/04/12 0900  metroNIDAZOLE (FLAGYL) IVPB 500 mg  Status:  Discontinued     500 mg 100 mL/hr over 60 Minutes Intravenous Every 8 hours 07/04/12 0849 07/07/12 1617   07/04/12 0000  vancomycin (VANCOCIN) 50 mg/mL oral solution 250 mg  Status:  Discontinued     250 mg Oral 4 times per day 07/03/12 1918 07/04/12 0849   07/03/12 2200  metroNIDAZOLE (FLAGYL) tablet 500 mg  Status:  Discontinued     500 mg Oral 3 times per day 07/03/12 1918 07/04/12 0849   07/03/12 1800  vancomycin (VANCOCIN) 50 mg/mL oral solution 125 mg  Status:  Discontinued     125 mg Oral 4 times per day 07/03/12 1537 07/03/12 1918   07/03/12 1700  oseltamivir (TAMIFLU) capsule 75 mg  Status:  Discontinued     75 mg Oral Daily 07/03/12 1540 07/07/12 1617   07/03/12 1530  vancomycin (VANCOCIN) 2,000 mg in sodium chloride 0.9 % 500 mL IVPB     2,000  mg 250 mL/hr over 120 Minutes Intravenous  Once 07/03/12 1526 07/03/12 1913   07/03/12 1530  ceFEPIme (MAXIPIME) 1 g in dextrose 5 % 50 mL IVPB  Status:  Discontinued     1 g 100 mL/hr over 30 Minutes Intravenous Every 24 hours 07/03/12 1526 07/03/12 1918       Assessment/Plan  1. Abdominal pain, ? Enteritis 2. ARF, now on HD  Plan: 1. Patient still has some abdominal pain, but tolerating his full liquids.  He wants a soft diet today. 2. His pain will likely gradually get better.  Will follow.    LOS: 6 days    OSBORNE,KELLY E 07/09/2012, 9:58 AM Pager: [phone]  Still with mild right sided abdominal pain of uncertain etiology.  Says that it is better than admission.  Tolerating diet and AF.  Wbc normal.  Exam with mild right sided tenderness, no peritoneal signs.  I am not sure what is causing this but suspect that it will continue to improve with nonoperative management.

## 2012-07-09 NOTE — Progress Notes (Signed)
47yo male with ESRD on HD to begin IV ABX for bacteremia.  Will begin Fortaz 2g after each HD.  Wynona Neat, PharmD, BCPS 07/09/2012 7:30 AM

## 2012-07-09 NOTE — Procedures (Signed)
Assessment/ Plan:   1. Acute renal failure chronic kidney disease stage IV/V: Suspect evolution to dialysis dependent ESRD will need to start process for OP HD unit placement(CLIP)  2. Respiratory failure/sepsis: Suspected intra-abdominal pathology-possible ischemic bowel.  3. Anemia: ESA therapy  4. CKD-MBD: will start binder when consistently taking PO intake   Diarrhea less, but abdominal pain persists.Will need to plan for CLIP process. Lungs clear, Cor RRR, abd distended, ext right arm AV access functioning, edema in rle 1+ tight, left stump from BKA  Tolerating hemodialysis currently.  No changes needed today. Will reevaluate for repeat HD when seen in AM. Andrei Mccook C

## 2012-07-09 NOTE — Progress Notes (Signed)
Clip process started per Dr Abel Presto request. Required paperwork faxed to [facility] office.

## 2012-07-09 NOTE — Progress Notes (Signed)
[facility]  Date of Admission:  07/03/2012  Antibiotics: [name] day 1  Subjective: Still with some abdominal pain, no fever or chills  Objective: Temp:  [96.8 F (36 C)-98.5 F (36.9 C)] 98.5 F (36.9 C) (03/17 1113) Pulse Rate:  [76-89] 87 (03/17 1113) Resp:  [16-18] 18 (03/17 1113) BP: (126-163)/(61-82) 126/61 mmHg (03/17 1113) SpO2:  [95 %-100 %] 100 % (03/17 1113) Weight:  [258 lb 2.5 oz (117.1 kg)-263 lb 0.1 oz (119.3 kg)] 258 lb 2.5 oz (117.1 kg) (03/17 1031)  General: Awake, alert, nad Skin: no rashes Lungs: CTA B Cor: RRR without m/r/g Abdomen: obese, ntnd, +bs   Lab Results Lab Results  Component Value Date   WBC 5.7 07/09/2012   HGB 9.3* 07/09/2012   HCT 27.3* 07/09/2012   MCV 91.9 07/09/2012   PLT 124* 07/09/2012    Lab Results  Component Value Date   CREATININE 4.64* 07/09/2012   BUN 69* 07/09/2012   NA 135 07/09/2012   K 3.3* 07/09/2012   CL 101 07/09/2012   CO2 21 07/09/2012    Lab Results  Component Value Date   ALT 13 07/04/2012   AST 57* 07/04/2012   ALKPHOS 51 07/04/2012   BILITOT 0.4 07/04/2012      Microbiology: Recent Results (from the past 240 hour(s))  CULTURE, BLOOD (ROUTINE X 2)     Status: None   Collection Time    07/03/12  1:50 PM      Result Value Range Status   Specimen Description BLOOD LEFT FOREARM   Final   Special Requests BOTTLES DRAWN AEROBIC ONLY 10CC   Final   Culture  Setup Time 07/04/2012 04:59   Final   Culture     Final   Value: GRAM NEGATIVE RODS     Note: Gram Stain Report Called to,Read Back By and Verified With: RACHEL FOUNTAIN 07/08/12 @ 12:33PM BY RUSCA.   Report Status PENDING   Incomplete  URINE CULTURE     Status: None   Collection Time    07/03/12  2:51 PM      Result Value Range Status   Specimen Description URINE, CLEAN CATCH   Final   Special Requests Immunocompromised   Final   Culture  Setup Time 07/03/2012 15:45   Final   Colony Count NO GROWTH   Final   Culture NO  GROWTH   Final   Report Status 07/05/2012 FINAL   Final  CLOSTRIDIUM DIFFICILE BY PCR     Status: None   Collection Time    07/03/12  3:54 PM      Result Value Range Status   C difficile by pcr NEGATIVE  NEGATIVE Final  CULTURE, BLOOD (ROUTINE X 2)     Status: None   Collection Time    07/03/12  4:20 PM      Result Value Range Status   Specimen Description BLOOD HAND LEFT   Final   Special Requests BOTTLES DRAWN AEROBIC AND ANAEROBIC 10CC   Final   Culture  Setup Time 07/04/2012 02:22   Final   Culture     Final   Value:        BLOOD CULTURE RECEIVED NO GROWTH TO DATE CULTURE WILL BE HELD FOR 5 DAYS BEFORE ISSUING A FINAL NEGATIVE REPORT   Report Status PENDING   Incomplete  URINE CULTURE     Status: None   Collection Time    07/04/12 12:17 PM  Result Value Range Status   Specimen Description URINE, CATHETERIZED   Final   Special Requests NONE   Final   Culture  Setup Time 07/05/2012 02:41   Final   Colony Count NO GROWTH   Final   Culture NO GROWTH   Final   Report Status 07/05/2012 FINAL   Final  CULTURE, RESPIRATORY (NON-EXPECTORATED)     Status: None   Collection Time    07/04/12 12:20 PM      Result Value Range Status   Specimen Description TRACHEAL ASPIRATE   Final   Special Requests NONE   Final   Gram Stain     Final   Value: RARE WBC PRESENT, PREDOMINANTLY MONONUCLEAR     RARE SQUAMOUS EPITHELIAL CELLS PRESENT     NO ORGANISMS SEEN   Culture NO GROWTH 2 DAYS   Final   Report Status 07/07/2012 FINAL   Final  CLOSTRIDIUM DIFFICILE BY PCR     Status: None   Collection Time    07/04/12  4:08 PM      Result Value Range Status   C difficile by pcr NEGATIVE  NEGATIVE Final  CULTURE, BLOOD (ROUTINE X 2)     Status: None   Collection Time    07/04/12  5:30 PM      Result Value Range Status   Specimen Description BLOOD HAND LEFT   Final   Special Requests BOTTLES DRAWN AEROBIC ONLY 5CC   Final   Culture  Setup Time 07/05/2012 03:29   Final   Culture     Final    Value:        BLOOD CULTURE RECEIVED NO GROWTH TO DATE CULTURE WILL BE HELD FOR 5 DAYS BEFORE ISSUING A FINAL NEGATIVE REPORT   Report Status PENDING   Incomplete  CULTURE, BLOOD (ROUTINE X 2)     Status: None   Collection Time    07/04/12  8:14 PM      Result Value Range Status   Specimen Description BLOOD LEFT HAND   Final   Special Requests BOTTLES DRAWN AEROBIC ONLY 4CC   Final   Culture  Setup Time 07/05/2012 02:42   Final   Culture     Final   Value:        BLOOD CULTURE RECEIVED NO GROWTH TO DATE CULTURE WILL BE HELD FOR 5 DAYS BEFORE ISSUING A FINAL NEGATIVE REPORT   Report Status PENDING   Incomplete    Studies/Results: No results found.  Assessment/Plan: 1) Enterocolitis - improving.  Is growing GNR in 1/2 blood cultures.  Repeat set on 3/12 ngtd.  -bacteremia likely as a result of inflammation and more like a transient process.  Treat for 7 days total with [name] if it is sensitive.    Will sign off, call with questions. Thanks   Scharlene Gloss, [facility] pager   07/09/2012, 1:46 PM

## 2012-07-09 NOTE — Progress Notes (Signed)
PT has been refusing CPAP machine, RT will assist as needed.

## 2012-07-09 NOTE — Progress Notes (Signed)
Utilization review completed.  

## 2012-07-09 NOTE — Progress Notes (Signed)
[facility] PROGRESS NOTE  Dale [name] [id number] DOB: 13-Dec-1965 DOA: 07/03/2012 PCP: Windy Kalata, MD  Assessment/Plan: 1. GNR bacteremia with severe septic shock - patient improved with IV fluid support. He did not require pressors. Patient received empiric IV antibiotics including Flagyl and cefepime. On March 17 the patient was started on IV Fortaz.  2. Respiratory failure - secondary to abdominal distention and pain -  intubated and mechanically ventilated from March 12 until March 14. Extubated without difficulty. Currently off oxygen supplements.  3. ESRD s/p renal transplant - resumed HD during current admission -  4. DM type I - on insulin pump at home  5. severe abdominal pain and diarrheal illness-C. difficile negative x2, 6. hypokalemia and hypocalcemia during the critical care time in the ICU-repleted 7. patient on chronic immunosuppression for renal transplant including 5 mg of prednisone daily. Patient received stress dose IV steroids while in the ICU. IV hydrocortisone stopped on March 17.     Code Status: Full code Family Communication: Patient (indicate person spoken with, relationship, and if by phone, the number) Disposition Plan: Home   Consultants: Critical care Gastroenterology Surgery Infectious disease   Procedures: 3/12 CT abd/pelvis >Circumferential thickening of distal small bowel and proximal colon. In keeping with an enterocolitis. Ischemia not excluded.  L IJ TLC 3/12>>> 3/16 ET tube 3/12>>> 3/14 Foley 3/12>>>  NGT 3/12>>>     ANTIBIOTICS:  Vanc IV 3/12 >>> 3/15  Vanc PO 3/13 >>> 3/15 Flagyl 3/11 >>> 3/15 Cefepime 3/11 >>> 3/15 Tamiflu 3/10 >>> 3/15 Fortaz 3/17 -    HPI/Subjective: Still complains of abdominal pain but does not have as much diarrhea  Objective: Filed Vitals:   07/09/12 1000 07/09/12 1031 07/09/12 1113 07/09/12 1437  BP: 131/72 127/71 126/61 135/80  Pulse: 85 84 87 86  Temp:  96.8 F (36 C) 98.5 F  (36.9 C) 97.5 F (36.4 C)  TempSrc:  Oral Oral Oral  Resp:  18 18 18   Height:      Weight:  117.1 kg (258 lb 2.5 oz)    SpO2:  97% 100% 98%    Intake/Output Summary (Last 24 hours) at 07/09/12 1508 Last data filed at 07/09/12 1031  Gross per 24 hour  Intake   1260 ml  Output   2298 ml  Net  -1038 ml   Filed Weights   07/08/12 2027 07/09/12 0645 07/09/12 1031  Weight: 118.601 kg (261 lb 7.5 oz) 119.3 kg (263 lb 0.1 oz) 117.1 kg (258 lb 2.5 oz)    Exam:   General:  Alert and oriented times 3  Cardiovascular: Regular rate and rhythm  Respiratory: Clear to auscultation bilaterally  Abdomen: soft, tender   Data Reviewed: Basic Metabolic Panel:  Recent Labs Lab 07/04/12 0635  07/05/12 0355 07/05/12 1821 07/06/12 0830 07/06/12 1026 07/07/12 0410 07/07/12 0411 07/08/12 1050 07/09/12 0500  NA 137  < > 138 134* 137  --   --  139 134* 135  K 3.1*  < > 2.7* 4.3 4.3  --   --  3.5 3.6 3.3*  CL 100  < > 101 98 98  --   --  101 99 101  CO2 14*  < > 19 16* 21  --   --  22 21 21   GLUCOSE 125*  < > 115* 328* 268*  --   --  186* 282* 212*  BUN 114*  < > 81* 87* 55*  --   --  71* 59* 69*  CREATININE  7.40*  < > 7.87* 8.52* 5.63*  --   --  6.27* 4.45* 4.64*  CALCIUM 8.3*  < > 7.7* 7.4* 7.6*  --   --  7.9* 8.1* 8.5  MG  --   --  1.6  --   --  2.3 2.3  --   --   --   PHOS 3.6  --  4.4  --  5.5*  --   --  5.6*  --  4.5  < > = values in this interval not displayed. Liver Function Tests:  Recent Labs Lab 07/03/12 1323 07/04/12 0635 07/04/12 1026 07/06/12 0830 07/07/12 0411 07/09/12 0500  AST 19 42* 57*  --   --   --   ALT 10 12 13   --   --   --   ALKPHOS 67 59 51  --   --   --   BILITOT 0.4 0.4 0.4  --   --   --   PROT 7.3 6.9 6.5  --   --   --   ALBUMIN 2.3* 2.2* 2.0* 2.0* 2.0* 2.2*   No results found for this basename: LIPASE, AMYLASE,  in the last 168 hours No results found for this basename: AMMONIA,  in the last 168 hours CBC:  Recent Labs Lab 07/03/12 1323   07/07/12 0410 07/07/12 1147 07/07/12 1725 07/08/12 07/09/12 0500  WBC 18.4*  < > 9.2 9.4 8.5 8.0 5.7  NEUTROABS 17.0*  --   --   --   --   --   --   HGB 11.4*  < > 9.2* 9.4* 9.3* 8.6* 9.3*  HCT 34.7*  < > 28.0* 28.3* 28.4* 25.0* 27.3*  MCV 97.2  < > 93.6 94.0 95.0 93.6 91.9  PLT 149*  < > PLATELET CLUMPS NOTED ON SMEAR, COUNT APPEARS DECREASED 126* 114* 131* 124*  < > = values in this interval not displayed. Cardiac Enzymes:  Recent Labs Lab 07/03/12 2014 07/04/12 1225  CKTOTAL 154  --   TROPONINI  --  0.60*   BNP (last 3 results) No results found for this basename: PROBNP,  in the last 8760 hours CBG:  Recent Labs Lab 07/08/12 1654 07/08/12 2032 07/08/12 2345 07/09/12 0343 07/09/12 1109  GLUCAP 348* 323* 104* 126* 164*    Recent Results (from the past 240 hour(s))  CULTURE, BLOOD (ROUTINE X 2)     Status: None   Collection Time    07/03/12  1:50 PM      Result Value Range Status   Specimen Description BLOOD LEFT FOREARM   Final   Special Requests BOTTLES DRAWN AEROBIC ONLY 10CC   Final   Culture  Setup Time 07/04/2012 04:59   Final   Culture     Final   Value: GRAM NEGATIVE RODS     Note: Gram Stain Report Called to,Read Back By and Verified With: RACHEL FOUNTAIN 07/08/12 @ 12:33PM BY RUSCA.   Report Status PENDING   Incomplete  URINE CULTURE     Status: None   Collection Time    07/03/12  2:51 PM      Result Value Range Status   Specimen Description URINE, CLEAN CATCH   Final   Special Requests Immunocompromised   Final   Culture  Setup Time 07/03/2012 15:45   Final   Colony Count NO GROWTH   Final   Culture NO GROWTH   Final   Report Status 07/05/2012 FINAL   Final  CLOSTRIDIUM DIFFICILE BY PCR  Status: None   Collection Time    07/03/12  3:54 PM      Result Value Range Status   C difficile by pcr NEGATIVE  NEGATIVE Final  CULTURE, BLOOD (ROUTINE X 2)     Status: None   Collection Time    07/03/12  4:20 PM      Result Value Range Status    Specimen Description BLOOD HAND LEFT   Final   Special Requests BOTTLES DRAWN AEROBIC AND ANAEROBIC 10CC   Final   Culture  Setup Time 07/04/2012 02:22   Final   Culture     Final   Value:        BLOOD CULTURE RECEIVED NO GROWTH TO DATE CULTURE WILL BE HELD FOR 5 DAYS BEFORE ISSUING A FINAL NEGATIVE REPORT   Report Status PENDING   Incomplete  URINE CULTURE     Status: None   Collection Time    07/04/12 12:17 PM      Result Value Range Status   Specimen Description URINE, CATHETERIZED   Final   Special Requests NONE   Final   Culture  Setup Time 07/05/2012 02:41   Final   Colony Count NO GROWTH   Final   Culture NO GROWTH   Final   Report Status 07/05/2012 FINAL   Final  CULTURE, RESPIRATORY (NON-EXPECTORATED)     Status: None   Collection Time    07/04/12 12:20 PM      Result Value Range Status   Specimen Description TRACHEAL ASPIRATE   Final   Special Requests NONE   Final   Gram Stain     Final   Value: RARE WBC PRESENT, PREDOMINANTLY MONONUCLEAR     RARE SQUAMOUS EPITHELIAL CELLS PRESENT     NO ORGANISMS SEEN   Culture NO GROWTH 2 DAYS   Final   Report Status 07/07/2012 FINAL   Final  CLOSTRIDIUM DIFFICILE BY PCR     Status: None   Collection Time    07/04/12  4:08 PM      Result Value Range Status   C difficile by pcr NEGATIVE  NEGATIVE Final  CULTURE, BLOOD (ROUTINE X 2)     Status: None   Collection Time    07/04/12  5:30 PM      Result Value Range Status   Specimen Description BLOOD HAND LEFT   Final   Special Requests BOTTLES DRAWN AEROBIC ONLY 5CC   Final   Culture  Setup Time 07/05/2012 03:29   Final   Culture     Final   Value:        BLOOD CULTURE RECEIVED NO GROWTH TO DATE CULTURE WILL BE HELD FOR 5 DAYS BEFORE ISSUING A FINAL NEGATIVE REPORT   Report Status PENDING   Incomplete  CULTURE, BLOOD (ROUTINE X 2)     Status: None   Collection Time    07/04/12  8:14 PM      Result Value Range Status   Specimen Description BLOOD LEFT HAND   Final   Special  Requests BOTTLES DRAWN AEROBIC ONLY 4CC   Final   Culture  Setup Time 07/05/2012 02:42   Final   Culture     Final   Value:        BLOOD CULTURE RECEIVED NO GROWTH TO DATE CULTURE WILL BE HELD FOR 5 DAYS BEFORE ISSUING A FINAL NEGATIVE REPORT   Report Status PENDING   Incomplete     Studies: No results found.  Scheduled Meds: .  allopurinol  100 mg Oral Daily  . aspirin  81 mg Oral Daily  . calcium acetate  667 mg Oral 5 X Daily  . carvedilol  6.25 mg Oral BID WC  . [START ON 07/11/2012] cefTAZidime (FORTAZ)  IV  2 g Intravenous Q M,W,F-2000  . cycloSPORINE modified  100 mg Oral BID  . famotidine  20 mg Oral Q1200  . insulin aspart  0-15 Units Subcutaneous Q4H  . insulin glargine  12 Units Subcutaneous BID  . levothyroxine  88 mcg Oral QAC breakfast  . [START ON 07/10/2012] predniSONE  5 mg Oral Q breakfast  . rOPINIRole  1 mg Oral QHS  . sodium chloride  3 mL Intravenous Q12H   Continuous Infusions: . sodium chloride 10 mL/hr at 07/08/12 0416    Principal Problem:   Bacteremia due to Gram-negative bacteria Active Problems:   History of renal transplant   Obstructive sleep apnea   Anemia   Diabetes mellitus   Fever and chills   Diarrhea   Nausea   Leukocytosis   AKI (acute kidney injury)   Acute respiratory failure   Septic shock   Metabolic acidosis        Harolyn Cocker  [facility] Pager [phone]. If 7PM-7AM, please contact night-coverage at [email or web address], password [facility] 07/09/2012, 3:08 PM  LOS: 6 days

## 2012-07-09 NOTE — Progress Notes (Signed)
PT Cancellation Note  Patient Details Name: Dale [name] MRN: [id number] DOB: 1965-06-05   Cancelled Treatment:    Reason Eval/Treat Not Completed: Medical issues which prohibited therapy pt in HD this morning and says he is too sick to participate this afternoon   Norwood Levo 07/09/2012, 4:02 PM

## 2012-07-09 NOTE — Progress Notes (Signed)
      [facility] Daily Rounding Note 07/09/2012, 8:32 AM  SUBJECTIVE:       Still with nausea, no vomiting.  Pain is diffuse in belly.  Stools loose and "tarry"  OBJECTIVE:         Vital signs in last 24 hours:    Temp:  [97 F (36.1 C)-98.1 F (36.7 C)] 97 F (36.1 C) (03/17 0645) Pulse Rate:  [76-91] 78 (03/17 0800) Resp:  [16-22] 16 (03/17 0659) BP: (128-163)/(59-82) 128/68 mmHg (03/17 0800) SpO2:  [84 %-99 %] 96 % (03/17 0645) Arterial Line BP: (134-183)/(64-89) 183/74 mmHg (03/16 1030) Weight:  [118.601 kg (261 lb 7.5 oz)-119.3 kg (263 lb 0.1 oz)] 119.3 kg (263 lb 0.1 oz) (03/17 0645) Last BM Date: 07/08/12 General: obese, lethargic, not toxic   Heart: RRR Chest: clear B.  No cough or resp distress Abdomen: soft, obese, BS hypoactive.  Tender thruout but not guarding or rebounding  Extremities: minor pedal edema Neuro/Psych:  Oriented x 3.  Not confused.  Relaxed.   Intake/Output from previous day: 03/16 0701 - 03/17 0700 In: 1980 [P.O.:1860; I.V.:120] Out: 1077 [Urine:450; Stool:327]  Intake/Output this shift:    Lab Results:  Recent Labs  07/07/12 1725 07/08/12 07/09/12 0500  WBC 8.5 8.0 5.7  HGB 9.3* 8.6* 9.3*  HCT 28.4* 25.0* 27.3*  PLT 114* 131* 124*   BMET  Recent Labs  07/07/12 0411 07/08/12 1050 07/09/12 0500  NA 139 134* 135  K 3.5 3.6 3.3*  CL 101 99 101  CO2 22 21 21   GLUCOSE 186* 282* 212*  BUN 71* 59* 69*  CREATININE 6.27* 4.45* 4.64*  CALCIUM 7.9* 8.1* 8.5   LFT  Recent Labs  07/07/12 0411 07/09/12 0500  ALBUMIN 2.0* 2.2*   PT/INR  Recent Labs  07/08/12  LABPROT 14.4  INR 1.14   Hepatitis Panel No results found for this basename: HEPBSAG, HCVAB, HEPAIGM, HEPBIGM,  in the last 72 hours  Studies/Results: 07/04/12 CT ABD Pelvis, no contrast IMPRESSION:  Circumferential thickening of distal small bowel and proximal  colon. In keeping with an enterocolitis. Ischemia not excluded.   ASSESMENT: *  Diarrhea, abd pain.  NGT, rectal tube removed over weekend. Dr Megan Salon, ID, suspects acute gastroenteritis of unknown cause. C diff negative on 3/12. Was observing off abx, GNR on single blood clx from 3/11 restarted of [facility] 3/17 [id number] colonoscopy with left tics, int rrhoids, pale to purplish discolration throughout:  right colon biopsies confirmed ischemic change, no pathology on left colon BX  His pain, loose stools, nausea persist.  ? Recurrent ischemic colitis? *  Diabetic gastroparesis *  GERD.  EGD 08/2006.  Unable to pull up report.  *  ESRD *  Sepsis with resp failure. .   *  Chronic dz anemia.  *  Morbid obesity *  OSA, refuses Cpap  PLAN: *  Per MD   LOS: 6 days   Azucena Freed  07/09/2012, 8:32 AM Pager: [phone]

## 2012-07-10 DIAGNOSIS — A020 Salmonella enteritis: Secondary | ICD-10-CM

## 2012-07-10 DIAGNOSIS — R7881 Bacteremia: Secondary | ICD-10-CM | POA: Diagnosis present

## 2012-07-10 LAB — HEPATIC FUNCTION PANEL
ALT: 21 U/L (ref 0–53)
AST: 32 U/L (ref 0–37)
Albumin: 2.2 g/dL — ABNORMAL LOW (ref 3.5–5.2)
Alkaline Phosphatase: 73 U/L (ref 39–117)
Bilirubin, Direct: 0.3 mg/dL (ref 0.0–0.3)
Indirect Bilirubin: 0.3 mg/dL (ref 0.3–0.9)
Total Bilirubin: 0.6 mg/dL (ref 0.3–1.2)
Total Protein: 6.4 g/dL (ref 6.0–8.3)

## 2012-07-10 LAB — CULTURE, BLOOD (ROUTINE X 2): Culture: NO GROWTH

## 2012-07-10 LAB — GLUCOSE, CAPILLARY
Glucose-Capillary: 142 mg/dL — ABNORMAL HIGH (ref 70–99)
Glucose-Capillary: 157 mg/dL — ABNORMAL HIGH (ref 70–99)
Glucose-Capillary: 162 mg/dL — ABNORMAL HIGH (ref 70–99)
Glucose-Capillary: 169 mg/dL — ABNORMAL HIGH (ref 70–99)
Glucose-Capillary: 170 mg/dL — ABNORMAL HIGH (ref 70–99)
Glucose-Capillary: 183 mg/dL — ABNORMAL HIGH (ref 70–99)
Glucose-Capillary: 195 mg/dL — ABNORMAL HIGH (ref 70–99)
Glucose-Capillary: 233 mg/dL — ABNORMAL HIGH (ref 70–99)
Glucose-Capillary: 251 mg/dL — ABNORMAL HIGH (ref 70–99)

## 2012-07-10 LAB — LIPASE, BLOOD: Lipase: 46 U/L (ref 11–59)

## 2012-07-10 MED ORDER — ONDANSETRON HCL 4 MG/2ML IJ SOLN
4.0000 mg | Freq: Four times a day (QID) | INTRAMUSCULAR | Status: DC | PRN
  Administered 2012-07-10 – 2012-07-11 (×4): 4 mg via INTRAVENOUS
  Filled 2012-07-10 (×5): qty 2

## 2012-07-10 MED ORDER — CYCLOSPORINE MODIFIED (NEORAL) 25 MG PO CAPS
75.0000 mg | ORAL_CAPSULE | Freq: Two times a day (BID) | ORAL | Status: DC
  Administered 2012-07-10 – 2012-07-14 (×8): 75 mg via ORAL
  Filled 2012-07-10 (×10): qty 3

## 2012-07-10 MED ORDER — CYCLOSPORINE MODIFIED (NEORAL) 25 MG PO CAPS
100.0000 mg | ORAL_CAPSULE | Freq: Two times a day (BID) | ORAL | Status: DC
  Administered 2012-07-10 – 2012-07-14 (×8): 100 mg via ORAL
  Filled 2012-07-10 (×9): qty 4

## 2012-07-10 MED ORDER — BOOST / RESOURCE BREEZE PO LIQD
1.0000 | Freq: Two times a day (BID) | ORAL | Status: DC
  Administered 2012-07-10 – 2012-07-14 (×6): 1 via ORAL

## 2012-07-10 NOTE — Progress Notes (Signed)
     [facility] Daily Rounding Note 07/10/2012, 8:47 AM  SUBJECTIVE:       Just one, small loose stool yesterday, none today.  Abdominal pain and nausea persist.  Pain meds help.  MD regressed diet to full liquids.    OBJECTIVE:         Vital signs in last 24 hours:    Temp:  [96.8 F (36 C)-98.5 F (36.9 C)] 97.6 F (36.4 C) (03/18 0415) Pulse Rate:  [78-87] 78 (03/18 0415) Resp:  [18-20] 20 (03/18 0415) BP: (126-150)/(61-80) 146/63 mmHg (03/18 0415) SpO2:  [97 %-100 %] 97 % (03/18 0415) Weight:  [117 kg (257 lb 15 oz)-117.1 kg (258 lb 2.5 oz)] 117 kg (257 lb 15 oz) (03/18 0618) Last BM Date: 07/09/12 General: looks uncomfortable and unwell   Heart: RRR Chest: clear.  No SOB Abdomen: soft, obese, protuberant.  Tender mid and right abdomen.  BS present but hypoacitve.   Extremities: no CCE Neuro/Psych:  Oriented x 3.  Depressed.   Intake/Output from previous day: 03/17 0701 - 03/18 0700 In: 480 [P.O.:480] Out: 2200 [Urine:200; Stool:3]  Intake/Output this shift:    Lab Results:  Recent Labs  07/07/12 1725 07/08/12 07/09/12 0500  WBC 8.5 8.0 5.7  HGB 9.3* 8.6* 9.3*  HCT 28.4* 25.0* 27.3*  PLT 114* 131* 124*   BMET  Recent Labs  07/08/12 1050 07/09/12 0500  NA 134* 135  K 3.6 3.3*  CL 99 101  CO2 21 21  GLUCOSE 282* 212*  BUN 59* 69*  CREATININE 4.45* 4.64*  CALCIUM 8.1* 8.5   LFT  Recent Labs  07/09/12 0500  ALBUMIN 2.2*   PT/INR  Recent Labs  07/08/12  LABPROT 14.4  INR 1.14    ASSESMENT: * Diarrhea, abd pain. Enterocolitis by CT scan 3/12.  52013 colonoscopy with left tics, int rrhoids, pale to purplish discolration throughout: right colon biopsies confirmed ischemic change, no pathology on left colon BX  Given findings of Salmonella in blood, wonder if this is Salmonella enterocolitis. Still symptomatic  * GNR/Salmonella bacteremia with sepsis.  On Fortaz.   * Diabetic gastroparesis  * GERD. EGD 08/2006. Unable to pull up report.  *  ESRD  *  IDDM.  Poorly controlled.  * Sepsis with resp failure. .  * Chronic dz anemia.  * Morbid obesity  *  S/p renal transplant, now failed but still on cyclosporine/prednisone.  * OSA, refuses Cpap.    PLAN: *   Would call ID back to get input as to abx selection and duration of therapy   LOS: 7 days   Dale [name]  07/10/2012, 8:47 AM Pager: [phone]

## 2012-07-10 NOTE — Progress Notes (Signed)
[facility] PROGRESS NOTE  Dale [name] [id number] DOB: 03/15/1966 DOA: 07/03/2012 PCP: Windy Kalata, MD  Assessment/Plan: 1. Salmonella bacteremia with severe septic shock - patient improved with IV fluid support. He did not require pressors. Patient received empiric IV antibiotics including Flagyl and cefepime. On March 17 the patient was started on IV Fortaz. On 07/10/12 the lab called final speciation result on the GNR bacteremia as Salmonella bacteremia - ID  Dr. Linus Salmons called and he recommended 1 more week of iv Fortaz with HD. Stop date 07/17/12  2. Respiratory failure - secondary to abdominal distention and pain -  intubated and mechanically ventilated from March 12 until March 14. Extubated without difficulty. Currently off oxygen supplements.  3. Enterocolitis - due to salmonella. Diet now back to full liquids as patient was unable to tolerate regular diet  4. ESRD s/p renal transplant - resumed HD during current admission - patient to be discharged on dialysis. Continue immunosuppressants as prior to admission.  4. DM type I - on insulin pump at home . On lantus and ssi in house. Fair control  5. severe abdominal pain and diarrheal illness-C. difficile negative x2, 6. hypokalemia and hypocalcemia during the critical care time in the ICU-repleted 7. patient on chronic immunosuppression for renal transplant including 5 mg of prednisone daily. Patient received stress dose IV steroids while in the ICU. IV hydrocortisone stopped on March 17.     Code Status: Full code Family Communication: Patient Disposition Plan: Home   Consultants: Critical care - signed off Gastroenterology - signed off Surgery Infectious disease- signed off Nephrology    Procedures: 3/12 CT abd/pelvis >Circumferential thickening of distal small bowel and proximal colon. In keeping with an enterocolitis. Ischemia not excluded.  L IJ TLC 3/12>>> 3/16 ET tube 3/12>>> 3/14 Foley 3/12>>>  NGT  3/12>>>     ANTIBIOTICS:  Vanc IV 3/12 >>> 3/15  Vanc PO 3/13 >>> 3/15 Flagyl 3/11 >>> 3/15 Cefepime 3/11 >>> 3/15 Tamiflu 3/10 >>> 3/15 [name] 3/17 -    HPI/Subjective: Still complains of abdominal pain but does not have as much diarrhea  Objective: Filed Vitals:   07/09/12 2024 07/10/12 0415 07/10/12 0618 07/10/12 1028  BP:  146/63  130/71  Pulse:  78  81  Temp:  97.6 F (36.4 C)  97.6 F (36.4 C)  TempSrc:  Oral  Oral  Resp:  20  20  Height: 5\' 8"  (1.727 m)     Weight: 117.1 kg (258 lb 2.5 oz)  117 kg (257 lb 15 oz)   SpO2:  97%  99%    Intake/Output Summary (Last 24 hours) at 07/10/12 1250 Last data filed at 07/10/12 [id number]  Gross per 24 hour  Intake    240 ml  Output    102 ml  Net    138 ml   Filed Weights   07/09/12 1031 07/09/12 2024 07/10/12 0618  Weight: 117.1 kg (258 lb 2.5 oz) 117.1 kg (258 lb 2.5 oz) 117 kg (257 lb 15 oz)    Exam:   General:  Alert and oriented times 3  Cardiovascular: Regular rate and rhythm  Respiratory: Clear to auscultation bilaterally  Abdomen: soft, tender   Data Reviewed: Basic Metabolic Panel:  Recent Labs Lab 07/04/12 0635  07/05/12 0355 07/05/12 1821 07/06/12 0830 07/06/12 1026 07/07/12 0410 07/07/12 0411 07/08/12 1050 07/09/12 0500  NA 137  < > 138 134* 137  --   --  139 134* 135  K 3.1*  < > 2.7*  4.3 4.3  --   --  3.5 3.6 3.3*  CL 100  < > 101 98 98  --   --  101 99 101  CO2 14*  < > 19 16* 21  --   --  22 21 21   GLUCOSE 125*  < > 115* 328* 268*  --   --  186* 282* 212*  BUN 114*  < > 81* 87* 55*  --   --  71* 59* 69*  CREATININE 7.40*  < > 7.87* 8.52* 5.63*  --   --  6.27* 4.45* 4.64*  CALCIUM 8.3*  < > 7.7* 7.4* 7.6*  --   --  7.9* 8.1* 8.5  MG  --   --  1.6  --   --  2.3 2.3  --   --   --   PHOS 3.6  --  4.4  --  5.5*  --   --  5.6*  --  4.5  < > = values in this interval not displayed. Liver Function Tests:  Recent Labs Lab 07/03/12 1323 07/04/12 0635 07/04/12 1026 07/06/12 0830  07/07/12 0411 07/09/12 0500 07/10/12 1003  AST 19 42* 57*  --   --   --  32  ALT 10 12 13   --   --   --  21  ALKPHOS 67 59 51  --   --   --  73  BILITOT 0.4 0.4 0.4  --   --   --  0.6  PROT 7.3 6.9 6.5  --   --   --  6.4  ALBUMIN 2.3* 2.2* 2.0* 2.0* 2.0* 2.2* 2.2*    Recent Labs Lab 07/10/12 1003  LIPASE 46   No results found for this basename: AMMONIA,  in the last 168 hours CBC:  Recent Labs Lab 07/03/12 1323  07/07/12 0410 07/07/12 1147 07/07/12 1725 07/08/12 07/09/12 0500  WBC 18.4*  < > 9.2 9.4 8.5 8.0 5.7  NEUTROABS 17.0*  --   --   --   --   --   --   HGB 11.4*  < > 9.2* 9.4* 9.3* 8.6* 9.3*  HCT 34.7*  < > 28.0* 28.3* 28.4* 25.0* 27.3*  MCV 97.2  < > 93.6 94.0 95.0 93.6 91.9  PLT 149*  < > PLATELET CLUMPS NOTED ON SMEAR, COUNT APPEARS DECREASED 126* 114* 131* 124*  < > = values in this interval not displayed. Cardiac Enzymes:  Recent Labs Lab 07/03/12 2014 07/04/12 1225  CKTOTAL 154  --   TROPONINI  --  0.60*   BNP (last 3 results) No results found for this basename: PROBNP,  in the last 8760 hours CBG:  Recent Labs Lab 07/10/12 0152 07/10/12 0412 07/10/12 0830 07/10/12 1005 07/10/12 1121  GLUCAP 195* 183* 157* 162* 142*    Recent Results (from the past 240 hour(s))  CULTURE, BLOOD (ROUTINE X 2)     Status: None   Collection Time    07/03/12  1:50 PM      Result Value Range Status   Specimen Description BLOOD LEFT FOREARM   Final   Special Requests BOTTLES DRAWN AEROBIC ONLY 10CC   Final   Culture  Setup Time 07/04/2012 04:59   Final   Culture     Final   Value: SALMONELLA SPECIES     Note: CRITICAL RESULT CALLED TO, READ BACK BY AND VERIFIED WITH: NADINE WELLINGTON@1042  ON [id number] BY [place]     Note: Gram Stain Report Called  to,Read Back By and Verified With: RACHEL FOUNTAIN 07/08/12 @ 12:33PM BY RUSCA.   Report Status PENDING   Incomplete   Organism ID, Bacteria SALMONELLA SPECIES   Final  URINE CULTURE     Status: None   Collection Time     07/03/12  2:51 PM      Result Value Range Status   Specimen Description URINE, CLEAN CATCH   Final   Special Requests Immunocompromised   Final   Culture  Setup Time 07/03/2012 15:45   Final   Colony Count NO GROWTH   Final   Culture NO GROWTH   Final   Report Status 07/05/2012 FINAL   Final  CLOSTRIDIUM DIFFICILE BY PCR     Status: None   Collection Time    07/03/12  3:54 PM      Result Value Range Status   C difficile by pcr NEGATIVE  NEGATIVE Final  CULTURE, BLOOD (ROUTINE X 2)     Status: None   Collection Time    07/03/12  4:20 PM      Result Value Range Status   Specimen Description BLOOD HAND LEFT   Final   Special Requests BOTTLES DRAWN AEROBIC AND ANAEROBIC 10CC   Final   Culture  Setup Time 07/04/2012 02:22   Final   Culture NO GROWTH 5 DAYS   Final   Report Status 07/10/2012 FINAL   Final  URINE CULTURE     Status: None   Collection Time    07/04/12 12:17 PM      Result Value Range Status   Specimen Description URINE, CATHETERIZED   Final   Special Requests NONE   Final   Culture  Setup Time 07/05/2012 02:41   Final   Colony Count NO GROWTH   Final   Culture NO GROWTH   Final   Report Status 07/05/2012 FINAL   Final  CULTURE, RESPIRATORY (NON-EXPECTORATED)     Status: None   Collection Time    07/04/12 12:20 PM      Result Value Range Status   Specimen Description TRACHEAL ASPIRATE   Final   Special Requests NONE   Final   Gram Stain     Final   Value: RARE WBC PRESENT, PREDOMINANTLY MONONUCLEAR     RARE SQUAMOUS EPITHELIAL CELLS PRESENT     NO ORGANISMS SEEN   Culture NO GROWTH 2 DAYS   Final   Report Status 07/07/2012 FINAL   Final  CLOSTRIDIUM DIFFICILE BY PCR     Status: None   Collection Time    07/04/12  4:08 PM      Result Value Range Status   C difficile by pcr NEGATIVE  NEGATIVE Final  CULTURE, BLOOD (ROUTINE X 2)     Status: None   Collection Time    07/04/12  5:30 PM      Result Value Range Status   Specimen Description BLOOD HAND LEFT    Final   Special Requests BOTTLES DRAWN AEROBIC ONLY 5CC   Final   Culture  Setup Time 07/05/2012 03:29   Final   Culture     Final   Value:        BLOOD CULTURE RECEIVED NO GROWTH TO DATE CULTURE WILL BE HELD FOR 5 DAYS BEFORE ISSUING A FINAL NEGATIVE REPORT   Report Status PENDING   Incomplete  CULTURE, BLOOD (ROUTINE X 2)     Status: None   Collection Time    07/04/12  8:14 PM  Result Value Range Status   Specimen Description BLOOD LEFT HAND   Final   Special Requests BOTTLES DRAWN AEROBIC ONLY 4CC   Final   Culture  Setup Time 07/05/2012 02:42   Final   Culture     Final   Value:        BLOOD CULTURE RECEIVED NO GROWTH TO DATE CULTURE WILL BE HELD FOR 5 DAYS BEFORE ISSUING A FINAL NEGATIVE REPORT   Report Status PENDING   Incomplete     Studies: No results found.  Scheduled Meds: . allopurinol  100 mg Oral Daily  . aspirin  81 mg Oral Daily  . calcium acetate  667 mg Oral 5 X Daily  . carvedilol  6.25 mg Oral BID WC  . [START ON 07/11/2012] cefTAZidime (FORTAZ)  IV  2 g Intravenous Q M,W,F-2000  . cycloSPORINE modified  100 mg Oral BID  . famotidine  20 mg Oral Q1200  . feeding supplement  1 Container Oral BID BM  . insulin aspart  0-15 Units Subcutaneous Q4H  . insulin glargine  12 Units Subcutaneous BID  . levothyroxine  88 mcg Oral QAC breakfast  . predniSONE  5 mg Oral Q breakfast  . rOPINIRole  1 mg Oral QHS  . sodium chloride  3 mL Intravenous Q12H   Continuous Infusions: . sodium chloride 10 mL/hr at 07/08/12 0416    Principal Problem:   Salmonella bacteremia Active Problems:   History of renal transplant   S/P BKA (below knee amputation)   Obstructive sleep apnea   Anemia   Diabetes mellitus   ESRD (end stage renal disease)   Fever and chills   Diarrhea   Nausea   Leukocytosis   AKI (acute kidney injury)   Acute respiratory failure   Septic shock   Metabolic acidosis        Brownie Nehme  [facility] Pager [phone]. If 7PM-7AM,  please contact night-coverage at [email or web address], password [facility] 07/10/2012, 12:50 PM  LOS: 7 days

## 2012-07-10 NOTE — Progress Notes (Signed)
CRITICAL VALUE ALERT  Critical value received:  Blood culture : Salmonella species   Date of notification:  07/10/2012  Time of notification:  [id number]  Critical value read back:yes  Nurse who received alert:  Rico Sheehan RN  MD notified (1st page):  Dr Marye Round  Time of first page:  11:15 text Md, also notified by phone  MD notified (2nd page):  Time of second page:  Responding MD: Dr Marye Round  Time MD responded:  11:45

## 2012-07-10 NOTE — Progress Notes (Signed)
Patient ID: Dale [name], male   DOB: [birth year], 47 y.o.   MRN: [id number]    Subjective: Pt nauseated this morning.  Still having diffuse tenderness.  Objective: Vital signs in last 24 hours: Temp:  [96.8 F (36 C)-98.5 F (36.9 C)] 97.6 F (36.4 C) (03/18 0415) Pulse Rate:  [78-87] 78 (03/18 0415) Resp:  [18-20] 20 (03/18 0415) BP: (126-150)/(61-80) 146/63 mmHg (03/18 0415) SpO2:  [97 %-100 %] 97 % (03/18 0415) Weight:  [257 lb 15 oz (117 kg)-258 lb 2.5 oz (117.1 kg)] 257 lb 15 oz (117 kg) (03/18 0618) Last BM Date: 07/09/12  Intake/Output from previous day: 03/17 0701 - 03/18 0700 In: 480 [P.O.:480] Out: 2200 [Urine:200; Stool:3] Intake/Output this shift:    PE: Abd: soft, mild diffuse tenderness, no peritoneal signs or guarding, +BS, ND, obese  Lab Results:   Recent Labs  07/08/12 07/09/12 0500  WBC 8.0 5.7  HGB 8.6* 9.3*  HCT 25.0* 27.3*  PLT 131* 124*   BMET  Recent Labs  07/08/12 1050 07/09/12 0500  NA 134* 135  K 3.6 3.3*  CL 99 101  CO2 21 21  GLUCOSE 282* 212*  BUN 59* 69*  CREATININE 4.45* 4.64*  CALCIUM 8.1* 8.5   PT/INR  Recent Labs  07/08/12  LABPROT 14.4  INR 1.14   CMP     Component Value Date/Time   NA 135 07/09/2012 0500   K 3.3* 07/09/2012 0500   CL 101 07/09/2012 0500   CO2 21 07/09/2012 0500   GLUCOSE 212* 07/09/2012 0500   BUN 69* 07/09/2012 0500   CREATININE 4.64* 07/09/2012 0500   CALCIUM 8.5 07/09/2012 0500   PROT 6.5 07/04/2012 1026   ALBUMIN 2.2* 07/09/2012 0500   AST 57* 07/04/2012 1026   ALT 13 07/04/2012 1026   ALKPHOS 51 07/04/2012 1026   BILITOT 0.4 07/04/2012 1026   GFRNONAA 14* 07/09/2012 0500   GFRAA 16* 07/09/2012 0500   Lipase     Component Value Date/Time   LIPASE 26 08/27/2011 0820       Studies/Results: No results found.  Anti-infectives: Anti-infectives   Start     Dose/Rate Route Frequency Ordered Stop   07/11/12 2000  cefTAZidime (FORTAZ) 2 g in dextrose 5 % 50 mL IVPB     2 g 100 mL/hr over 30  Minutes Intravenous Every M-W-F (2000) 07/09/12 0730     07/09/12 0730  cefTAZidime (FORTAZ) 2 g in dextrose 5 % 50 mL IVPB     2 g 100 mL/hr over 30 Minutes Intravenous  Once 07/09/12 0729 07/09/12 1032   07/05/12 2100  vancomycin (VANCOCIN) 750 mg in sodium chloride 0.9 % 150 mL IVPB     750 mg 150 mL/hr over 60 Minutes Intravenous  Once 07/05/12 2029 07/05/12 2200   07/05/12 1800  vancomycin (VANCOCIN) 50 mg/mL oral solution 500 mg  Status:  Discontinued     500 mg Per Tube 4 times per day 07/05/12 1511 07/07/12 1617   07/05/12 1600  vancomycin (VANCOCIN) 1,750 mg in sodium chloride 0.9 % 500 mL IVPB  Status:  Discontinued     1,750 mg 250 mL/hr over 120 Minutes Intravenous Every 48 hours 07/03/12 1541 07/03/12 1918   07/04/12 1700  vancomycin (VANCOCIN) IVPB 1000 mg/200 mL premix     1,000 mg 200 mL/hr over 60 Minutes Intravenous  Once 07/04/12 1648 07/04/12 1851   07/04/12 1600  ceFEPIme (MAXIPIME) 1 g in dextrose 5 % 50 mL IVPB  Status:  Discontinued  1 g 100 mL/hr over 30 Minutes Intravenous Every 24 hours 07/04/12 0858 07/07/12 1617   07/04/12 0900  metroNIDAZOLE (FLAGYL) IVPB 500 mg  Status:  Discontinued     500 mg 100 mL/hr over 60 Minutes Intravenous Every 8 hours 07/04/12 0849 07/07/12 1617   07/04/12 0000  vancomycin (VANCOCIN) 50 mg/mL oral solution 250 mg  Status:  Discontinued     250 mg Oral 4 times per day 07/03/12 1918 07/04/12 0849   07/03/12 2200  metroNIDAZOLE (FLAGYL) tablet 500 mg  Status:  Discontinued     500 mg Oral 3 times per day 07/03/12 1918 07/04/12 0849   07/03/12 1800  vancomycin (VANCOCIN) 50 mg/mL oral solution 125 mg  Status:  Discontinued     125 mg Oral 4 times per day 07/03/12 1537 07/03/12 1918   07/03/12 1700  oseltamivir (TAMIFLU) capsule 75 mg  Status:  Discontinued     75 mg Oral Daily 07/03/12 1540 07/07/12 1617   07/03/12 1530  vancomycin (VANCOCIN) 2,000 mg in sodium chloride 0.9 % 500 mL IVPB     2,000 mg 250 mL/hr over 120 Minutes  Intravenous  Once 07/03/12 1526 07/03/12 1913   07/03/12 1530  ceFEPIme (MAXIPIME) 1 g in dextrose 5 % 50 mL IVPB  Status:  Discontinued     1 g 100 mL/hr over 30 Minutes Intravenous Every 24 hours 07/03/12 1526 07/03/12 1918       Assessment/Plan  1. GNR bacteremia 2. Enteritis/colitis, suspect infectious 3. H/o ischemia colitis last year in 2013  Plan: 1. Will D/W MD about possibility of repeat CT scan.  I think it's likely going to take a while for his pain to resolve.  I doubt the CT will show much change.  He is currently afebrile and his WBCs are normal.  There are no acute surgical indications.  i suspect this will be a slow progression to getting completely better.  May need to back diet back down to full liquids.   LOS: 7 days    OSBORNE,KELLY E 07/10/2012, 9:19 AM Pager: [phone]  About the same as yesterday. I am not sure what is causing this enteritis.  He does not show any signs of need for emergent surgery.  We suspect that this will be self limiting.

## 2012-07-10 NOTE — Progress Notes (Signed)
NUTRITION FOLLOW UP  Intervention:   1. Resource Breeze po BID, each supplement provides 250 kcal and 9 grams of protein.  2. RD will continue to follow     Nutrition Dx:   Inadequate oral intake now related to poor appetite and food tolerance as evidenced by full liquid diet.   Goal:   EN goal no longer applicable.  New Goal: PO intake to meet >/=90% estimated protein needs.   Monitor:   PO intake, weight trends, I/O's, labs, diet advance   Assessment:   Pt was extubated on 3/14, NG tube out on 3/16. Pt was advanced to soft foods yesterday, but unable to tolerate and back down to full liquids this afternoon. Pt does drink Resource Breeze at home, agreeable to continuing while here to meet protein needs.  Pt continues on HD, likely this will be long term.   Height: Ht Readings from Last 1 Encounters:  07/09/12 5\' 8"  (1.727 m)    Weight Status:   Wt Readings from Last 1 Encounters:  07/10/12 257 lb 15 oz (117 kg)    Re-estimated needs:  Kcal: 2300-2500 Protein: 120-130 gm Fluid: urin output + 1000 cc  Skin: wound to toe  Diet Order: Full Liquid   Intake/Output Summary (Last 24 hours) at 07/10/12 1229 Last data filed at 07/10/12 [id number]  Gross per 24 hour  Intake    240 ml  Output    102 ml  Net    138 ml    Last BM: 3/17   Labs:   Recent Labs Lab 07/05/12 0355  07/06/12 0830 07/06/12 1026 07/07/12 0410 07/07/12 0411 07/08/12 1050 07/09/12 0500  NA 138  < > 137  --   --  139 134* 135  K 2.7*  < > 4.3  --   --  3.5 3.6 3.3*  CL 101  < > 98  --   --  101 99 101  CO2 19  < > 21  --   --  22 21 21   BUN 81*  < > 55*  --   --  71* 59* 69*  CREATININE 7.87*  < > 5.63*  --   --  6.27* 4.45* 4.64*  CALCIUM 7.7*  < > 7.6*  --   --  7.9* 8.1* 8.5  MG 1.6  --   --  2.3 2.3  --   --   --   PHOS 4.4  --  5.5*  --   --  5.6*  --  4.5  GLUCOSE 115*  < > 268*  --   --  186* 282* 212*  < > = values in this interval not displayed.  CBG (last 3)   Recent Labs  07/10/12 0830 07/10/12 1005 07/10/12 1121  GLUCAP 157* 162* 142*    Scheduled Meds: . allopurinol  100 mg Oral Daily  . aspirin  81 mg Oral Daily  . calcium acetate  667 mg Oral 5 X Daily  . carvedilol  6.25 mg Oral BID WC  . [START ON 07/11/2012] cefTAZidime (FORTAZ)  IV  2 g Intravenous Q M,W,F-2000  . cycloSPORINE modified  100 mg Oral BID  . famotidine  20 mg Oral Q1200  . insulin aspart  0-15 Units Subcutaneous Q4H  . insulin glargine  12 Units Subcutaneous BID  . levothyroxine  88 mcg Oral QAC breakfast  . predniSONE  5 mg Oral Q breakfast  . rOPINIRole  1 mg Oral QHS  . sodium chloride  3 mL Intravenous Q12H    Continuous Infusions: . sodium chloride 10 mL/hr at 07/08/12 0416    Orson Slick RD, LDN Pager [phone] After Hours pager [phone]

## 2012-07-10 NOTE — Progress Notes (Signed)
PT Cancellation Note  Patient Details Name: Dale [name] MRN: [id number] DOB: 1965/05/19   Cancelled Treatment:    Reason Eval/Treat Not Completed: Other (comment) (Pt c/o not feeling well after nausea medication and pain med)   Lorana Maffeo 07/10/2012, 2:49 PM

## 2012-07-10 NOTE — Progress Notes (Signed)
PT has been refusing CPAP. PT currently on RA with o2 sats 97%. RT will assist as needed.

## 2012-07-10 NOTE — Progress Notes (Signed)
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 Organism was pan-sensitive and [name] should be adequate coverage Would ask ID about the appropriate interval for IV ABX, but I imagine he would need 14 days of antibiotic therapy.  Defer to ID Supportive care, pain control as you are.  Would expect slow, but continued improvement

## 2012-07-10 NOTE — Progress Notes (Signed)
07/10/2012  Grayland Jack from [facility] called at 1042 blood culture drawn at 07/03/2012 was positive on 07/08/12 and growing [place]. [facility] RN.

## 2012-07-10 NOTE — Progress Notes (Signed)
OT Cancellation Note  Patient Details Name: Dale [name] MRN: [id number] DOB: 18-Dec-1965   Cancelled Treatment:    Reason Eval/Treat Not Completed: Other (comment) (Pt still nauseated positely declines therapy at this time) Will continue to check back.  [place] OTR/L Pager number [phone] 07/10/2012, 10:12 AM

## 2012-07-10 NOTE — Progress Notes (Signed)
Dale [name]   Subjective:   Interval History: no complaints Objective:  Vital signs in last 24 hours:  Temp:  [97.5 F (36.4 C)-98.5 F (36.9 C)] 97.6 F (36.4 C) (03/18 1028) Pulse Rate:  [78-87] 81 (03/18 1028) Resp:  [18-20] 20 (03/18 1028) BP: (126-150)/(61-80) 130/71 mmHg (03/18 1028) SpO2:  [97 %-100 %] 99 % (03/18 1028) Weight:  [117 kg (257 lb 15 oz)-117.1 kg (258 lb 2.5 oz)] 117 kg (257 lb 15 oz) (03/18 0618)  Weight change: -1.501 kg (-3 lb 5 oz) Filed Weights   07/09/12 1031 07/09/12 2024 07/10/12 0618  Weight: 117.1 kg (258 lb 2.5 oz) 117.1 kg (258 lb 2.5 oz) 117 kg (257 lb 15 oz)    Intake/Output: I/O last 3 completed shifts: In: 1202 [P.O.:1202] Out: 2400 [Urine:400; [id number]; Stool:3]   Intake/Output this shift:     [id number] resting in bed- NGT in-situ  [id number] RRR, normal S1 and S2  Resp:Decreased BS over bases- otherwise CTA  [id number], obese, NT, BS normal  [id number]    Basic Metabolic Panel:  Recent Labs Lab 07/04/12 0635  07/05/12 0355 07/05/12 1821 07/06/12 0830 07/06/12 1026 07/07/12 0410 07/07/12 0411 07/08/12 1050 07/09/12 0500  NA 137  < > 138 134* 137  --   --  139 134* 135  K 3.1*  < > 2.7* 4.3 4.3  --   --  3.5 3.6 3.3*  CL 100  < > 101 98 98  --   --  101 99 101  CO2 14*  < > 19 16* 21  --   --  22 21 21   GLUCOSE 125*  < > 115* 328* 268*  --   --  186* 282* 212*  BUN 114*  < > 81* 87* 55*  --   --  71* 59* 69*  CREATININE 7.40*  < > 7.87* 8.52* 5.63*  --   --  6.27* 4.45* 4.64*  CALCIUM 8.3*  < > 7.7* 7.4* 7.6*  --   --  7.9* 8.1* 8.5  MG  --   --  1.6  --   --  2.3 2.3  --   --   --   PHOS 3.6  --  4.4  --  5.5*  --   --  5.6*  --  4.5  < > = values in this interval not displayed.  Liver Function Tests:  Recent Labs Lab 07/03/12 1323 07/04/12 0635 07/04/12 1026 07/06/12 0830 07/07/12 0411 07/09/12 0500  AST 19 42* 57*  --   --   --   ALT 10 12 13   --   --   --   ALKPHOS 67  59 51  --   --   --   BILITOT 0.4 0.4 0.4  --   --   --   PROT 7.3 6.9 6.5  --   --   --   ALBUMIN 2.3* 2.2* 2.0* 2.0* 2.0* 2.2*   No results found for this basename: LIPASE, AMYLASE,  in the last 168 hours No results found for this basename: AMMONIA,  in the last 168 hours  CBC:  Recent Labs Lab 07/03/12 1323  07/07/12 0410 07/07/12 1147 07/07/12 1725 07/08/12 07/09/12 0500  WBC 18.4*  < > 9.2 9.4 8.5 8.0 5.7  NEUTROABS 17.0*  --   --   --   --   --   --   HGB 11.4*  < > 9.2* 9.4* 9.3* 8.6* 9.3*  HCT 34.7*  < > 28.0* 28.3* 28.4* 25.0* 27.3*  MCV 97.2  < > 93.6 94.0 95.0 93.6 91.9  PLT 149*  < > PLATELET CLUMPS NOTED ON SMEAR, COUNT APPEARS DECREASED 126* 114* 131* 124*  < > = values in this interval not displayed.  Cardiac Enzymes:  Recent Labs Lab 07/03/12 2014 07/04/12 1225  CKTOTAL 154  --   TROPONINI  --  0.60*    BNP: No components found with this basename: POCBNP,   CBG:  Recent Labs Lab 07/10/12 0004 07/10/12 0152 07/10/12 0412 07/10/12 0830 07/10/12 1005  GLUCAP 170* 195* 183* 157* 162*    Microbiology: Results for orders placed during the hospital encounter of 07/03/12  CULTURE, BLOOD (ROUTINE X 2)     Status: None   Collection Time    07/03/12  1:50 PM      Result Value Range Status   Specimen Description BLOOD LEFT FOREARM   Final   Special Requests BOTTLES DRAWN AEROBIC ONLY 10CC   Final   Culture  Setup Time 07/04/2012 04:59   Final   Culture     Final   Value: GRAM NEGATIVE RODS     [name]: Gram Stain Report Called to,Read Back By and Verified With: RACHEL FOUNTAIN 07/08/12 @ 12:33PM BY RUSCA.   Report Status PENDING   Incomplete  URINE CULTURE     Status: None   Collection Time    07/03/12  2:51 PM      Result Value Range Status   Specimen Description URINE, CLEAN CATCH   Final   Special Requests Immunocompromised   Final   Culture  Setup Time 07/03/2012 15:45   Final   Colony Count NO GROWTH   Final   Culture NO GROWTH   Final    Report Status 07/05/2012 FINAL   Final  CLOSTRIDIUM DIFFICILE BY PCR     Status: None   Collection Time    07/03/12  3:54 PM      Result Value Range Status   C difficile by pcr NEGATIVE  NEGATIVE Final  CULTURE, BLOOD (ROUTINE X 2)     Status: None   Collection Time    07/03/12  4:20 PM      Result Value Range Status   Specimen Description BLOOD HAND LEFT   Final   Special Requests BOTTLES DRAWN AEROBIC AND ANAEROBIC 10CC   Final   Culture  Setup Time 07/04/2012 02:22   Final   Culture NO GROWTH 5 DAYS   Final   Report Status 07/10/2012 FINAL   Final  URINE CULTURE     Status: None   Collection Time    07/04/12 12:17 PM      Result Value Range Status   Specimen Description URINE, CATHETERIZED   Final   Special Requests NONE   Final   Culture  Setup Time 07/05/2012 02:41   Final   Colony Count NO GROWTH   Final   Culture NO GROWTH   Final   Report Status 07/05/2012 FINAL   Final  CULTURE, RESPIRATORY (NON-EXPECTORATED)     Status: None   Collection Time    07/04/12 12:20 PM      Result Value Range Status   Specimen Description TRACHEAL ASPIRATE   Final   Special Requests NONE   Final   Gram Stain     Final   Value: RARE WBC PRESENT, PREDOMINANTLY MONONUCLEAR     RARE SQUAMOUS EPITHELIAL CELLS PRESENT     NO  ORGANISMS SEEN   Culture NO GROWTH 2 DAYS   Final   Report Status 07/07/2012 FINAL   Final  CLOSTRIDIUM DIFFICILE BY PCR     Status: None   Collection Time    07/04/12  4:08 PM      Result Value Range Status   C difficile by pcr NEGATIVE  NEGATIVE Final  CULTURE, BLOOD (ROUTINE X 2)     Status: None   Collection Time    07/04/12  5:30 PM      Result Value Range Status   Specimen Description BLOOD HAND LEFT   Final   Special Requests BOTTLES DRAWN AEROBIC ONLY 5CC   Final   Culture  Setup Time 07/05/2012 03:29   Final   Culture     Final   Value:        BLOOD CULTURE RECEIVED NO GROWTH TO DATE CULTURE WILL BE HELD FOR 5 DAYS BEFORE ISSUING A FINAL NEGATIVE REPORT    Report Status PENDING   Incomplete  CULTURE, BLOOD (ROUTINE X 2)     Status: None   Collection Time    07/04/12  8:14 PM      Result Value Range Status   Specimen Description BLOOD LEFT HAND   Final   Special Requests BOTTLES DRAWN AEROBIC ONLY 4CC   Final   Culture  Setup Time 07/05/2012 02:42   Final   Culture     Final   Value:        BLOOD CULTURE RECEIVED NO GROWTH TO DATE CULTURE WILL BE HELD FOR 5 DAYS BEFORE ISSUING A FINAL NEGATIVE REPORT   Report Status PENDING   Incomplete    Coagulation Studies:  Recent Labs  07/08/12  LABPROT 14.4  INR 1.14    Urinalysis: No results found for this basename: COLORURINE, APPERANCEUR, LABSPEC, PHURINE, GLUCOSEU, HGBUR, BILIRUBINUR, KETONESUR, PROTEINUR, UROBILINOGEN, NITRITE, LEUKOCYTESUR,  in the last 72 hours    Imaging: No results found.   Medications:   . sodium chloride 10 mL/hr at 07/08/12 0416   . allopurinol  100 mg Oral Daily  . aspirin  81 mg Oral Daily  . calcium acetate  667 mg Oral 5 X Daily  . carvedilol  6.25 mg Oral BID WC  . [START ON 07/11/2012] cefTAZidime (FORTAZ)  IV  2 g Intravenous Q M,[name],F-2000  . cycloSPORINE modified  100 mg Oral BID  . famotidine  20 mg Oral Q1200  . insulin aspart  0-15 Units Subcutaneous Q4H  . insulin glargine  12 Units Subcutaneous BID  . levothyroxine  88 mcg Oral QAC breakfast  . predniSONE  5 mg Oral Q breakfast  . rOPINIRole  1 mg Oral QHS  . sodium chloride  3 mL Intravenous Q12H   sodium chloride, acetaminophen, acetaminophen, hydrALAZINE, HYDROmorphone (DILAUDID) injection, labetalol, nitroGLYCERIN, ondansetron (ZOFRAN) IV, promethazine, sodium chloride  Assessment/ Plan:  1. Acute renal failure chronic kidney disease stage IV/V: Suspect evolution to dialysis dependent ESRD wants to dialyze at [place] 2. Anemia: Suspect underlying anemia of chronic kidney disease now compounded by acute illness, initiated ESA therapy  3. CKD-MBD: will start binder when consistently  taking PO intake (off NGT)  Plan dialysis in am    LOS: 7 Dale [name] @TODAY @10 :34 AM

## 2012-07-10 NOTE — Progress Notes (Signed)
Pt has been set up at [facility] on a TTS schedule at 12noon. Please Notify HD department with impending discharge date when ready.

## 2012-07-11 LAB — GLUCOSE, CAPILLARY
Glucose-Capillary: 165 mg/dL — ABNORMAL HIGH (ref 70–99)
Glucose-Capillary: 124 mg/dL — ABNORMAL HIGH (ref 70–99)
Glucose-Capillary: 148 mg/dL — ABNORMAL HIGH (ref 70–99)
Glucose-Capillary: 184 mg/dL — ABNORMAL HIGH (ref 70–99)
Glucose-Capillary: 201 mg/dL — ABNORMAL HIGH (ref 70–99)
Glucose-Capillary: 339 mg/dL — ABNORMAL HIGH (ref 70–99)

## 2012-07-11 LAB — CBC
HCT: 32.6 % — ABNORMAL LOW (ref 39.0–52.0)
Hemoglobin: 10.8 g/dL — ABNORMAL LOW (ref 13.0–17.0)
MCH: 30.9 pg (ref 26.0–34.0)
MCHC: 33.1 g/dL (ref 30.0–36.0)
MCV: 93.1 fL (ref 78.0–100.0)
Platelets: 157 10*3/uL (ref 150–400)
RBC: 3.5 MIL/uL — ABNORMAL LOW (ref 4.22–5.81)
RDW: 15.3 % (ref 11.5–15.5)
WBC: 4.8 10*3/uL (ref 4.0–10.5)

## 2012-07-11 LAB — CULTURE, BLOOD (ROUTINE X 2)
Culture: NO GROWTH
Culture: NO GROWTH

## 2012-07-11 LAB — RENAL FUNCTION PANEL
Albumin: 2.1 g/dL — ABNORMAL LOW (ref 3.5–5.2)
BUN: 49 mg/dL — ABNORMAL HIGH (ref 6–23)
CO2: 25 mEq/L (ref 19–32)
Calcium: 8.7 mg/dL (ref 8.4–10.5)
Chloride: 98 mEq/L (ref 96–112)
Creatinine, Ser: 4.52 mg/dL — ABNORMAL HIGH (ref 0.50–1.35)
GFR calc Af Amer: 17 mL/min — ABNORMAL LOW (ref >90)
GFR calc non Af Amer: 14 mL/min — ABNORMAL LOW (ref >90)
Glucose, Bld: 165 mg/dL — ABNORMAL HIGH (ref 70–99)
Phosphorus: 4.7 mg/dL — ABNORMAL HIGH (ref 2.3–4.6)
Potassium: 3.3 mEq/L — ABNORMAL LOW (ref 3.5–5.1)
Sodium: 135 mEq/L (ref 135–145)

## 2012-07-11 MED ORDER — PENTAFLUOROPROP-TETRAFLUOROETH EX AERO: 1.0000 "application " | INHALATION_SPRAY | CUTANEOUS | Status: DC | PRN

## 2012-07-11 MED ORDER — HYDROMORPHONE HCL 2 MG PO TABS
2.0000 mg | ORAL_TABLET | ORAL | Status: DC | PRN
  Administered 2012-07-12 (×3): 2 mg via ORAL
  Filled 2012-07-11 (×3): qty 1

## 2012-07-11 MED ORDER — SODIUM CHLORIDE 0.9 % IV SOLN: 100.0000 mL | INTRAVENOUS | Status: DC | PRN

## 2012-07-11 MED ORDER — LIDOCAINE-PRILOCAINE 2.5-2.5 % EX CREA: 1.0000 "application " | TOPICAL_CREAM | CUTANEOUS | Status: DC | PRN

## 2012-07-11 MED ORDER — ALTEPLASE 2 MG IJ SOLR: 2.0000 mg | Freq: Once | INTRAMUSCULAR | Status: AC | PRN

## 2012-07-11 MED ORDER — METOCLOPRAMIDE HCL 5 MG PO TABS
5.0000 mg | ORAL_TABLET | Freq: Three times a day (TID) | ORAL | Status: DC
  Administered 2012-07-11 – 2012-07-14 (×11): 5 mg via ORAL
  Filled 2012-07-11 (×15): qty 1

## 2012-07-11 MED ORDER — NEPRO/CARBSTEADY PO LIQD: 237.0000 mL | ORAL | Status: DC | PRN

## 2012-07-11 MED ORDER — HEPARIN SODIUM (PORCINE) 1000 UNIT/ML DIALYSIS
20.0000 [IU]/kg | INTRAMUSCULAR | Status: DC | PRN
  Filled 2012-07-11: qty 3

## 2012-07-11 MED ORDER — LIDOCAINE HCL (PF) 1 % IJ SOLN: 5.0000 mL | INTRAMUSCULAR | Status: DC | PRN

## 2012-07-11 MED ORDER — SORBITOL 70 % SOLN: 10.0000 mL | Freq: Every day | Status: DC | PRN

## 2012-07-11 MED ORDER — HEPARIN SODIUM (PORCINE) 1000 UNIT/ML DIALYSIS
1000.0000 [IU] | INTRAMUSCULAR | Status: DC | PRN
  Filled 2012-07-11: qty 1

## 2012-07-11 NOTE — Progress Notes (Signed)
Patient seen, examined, and I agree with the above documentation, including the assessment and plan. Still with significant abd pain requiring dilaudid.  Dilaudid switched over to oral today, and patient asked for an IV dose to be available for breakthrough.  He takes oral Dilaudid chronically at home 2 mg every 4 hours at baseline for chronic pain, therefore I expect his tolerance is higher than average We have discussed how narcotics to slow bowel transit and we want to minimize them whatever possible.  That being said I am sure he is having significant pain that will need treatment Agree with primary team that if pain continues without improvement would consider repeat CT scan, will check two-view abdomen in the morning Continue antibiotics until 07/17/12 per ID

## 2012-07-11 NOTE — Progress Notes (Signed)
[facility] ROUNDING NOTE   Subjective:   Interval History:nauseated this am  Objective:  Vital signs in last 24 hours:  Temp:  [97.5 F (36.4 C)-98.1 F (36.7 C)] 98 F (36.7 C) (03/19 0503) Pulse Rate:  [77-90] 77 (03/19 0503) Resp:  [18-20] 18 (03/19 0503) BP: (118-162)/(76-84) 118/76 mmHg (03/19 0503) SpO2:  [95 %-98 %] 95 % (03/19 0503) Weight:  [117.7 kg (259 lb 7.7 oz)] 117.7 kg (259 lb 7.7 oz) (03/18 2003)  Weight change: 0.6 kg (1 lb 5.2 oz) Filed Weights   07/09/12 2024 07/10/12 0618 07/10/12 2003  Weight: 117.1 kg (258 lb 2.5 oz) 117 kg (257 lb 15 oz) 117.7 kg (259 lb 7.7 oz)    Intake/Output: I/O last 3 completed shifts: In: 360 [P.O.:360] Out: 1 [Stool:1]   Intake/Output this shift:     [id number] resting in bed- NGT in-situ  GL:[id number] RRR, normal S1 and S2  Resp:Decreased BS over bases- otherwise CTA  EE:[id number], obese, NT, BS normal  CF:[id number]    Basic Metabolic Panel:  Recent Labs Lab 07/05/12 0355 07/05/12 1821 07/06/12 0830 07/06/12 1026 07/07/12 0410 07/07/12 0411 07/08/12 1050 07/09/12 0500  NA 138 134* 137  --   --  139 134* 135  K 2.7* 4.3 4.3  --   --  3.5 3.6 3.3*  CL 101 98 98  --   --  101 99 101  CO2 19 16* 21  --   --  22 21 21   GLUCOSE 115* 328* 268*  --   --  186* 282* 212*  BUN 81* 87* 55*  --   --  71* 59* 69*  CREATININE 7.87* 8.52* 5.63*  --   --  6.27* 4.45* 4.64*  CALCIUM 7.7* 7.4* 7.6*  --   --  7.9* 8.1* 8.5  MG 1.6  --   --  2.3 2.3  --   --   --   PHOS 4.4  --  5.5*  --   --  5.6*  --  4.5    Liver Function Tests:  Recent Labs Lab 07/06/12 0830 07/07/12 0411 07/09/12 0500 07/10/12 1003  AST  --   --   --  32  ALT  --   --   --  21  ALKPHOS  --   --   --  73  BILITOT  --   --   --  0.6  PROT  --   --   --  6.4  ALBUMIN 2.0* 2.0* 2.2* 2.2*    Recent Labs Lab 07/10/12 1003  LIPASE 46   No results found for this basename: AMMONIA,  in the last 168 hours  CBC:  Recent  Labs Lab 07/07/12 0410 07/07/12 1147 07/07/12 1725 07/08/12 07/09/12 0500  WBC 9.2 9.4 8.5 8.0 5.7  HGB 9.2* 9.4* 9.3* 8.6* 9.3*  HCT 28.0* 28.3* 28.4* 25.0* 27.3*  MCV 93.6 94.0 95.0 93.6 91.9  PLT PLATELET CLUMPS NOTED ON SMEAR, COUNT APPEARS DECREASED 126* 114* 131* 124*    Cardiac Enzymes:  Recent Labs Lab 07/04/12 1225  TROPONINI 0.60*    BNP: No components found with this basename: POCBNP,   CBG:  Recent Labs Lab 07/10/12 1712 07/10/12 2005 07/10/12 2356 07/11/12 0405 07/11/12 0801  GLUCAP 169* 251* 233* 165* 124*    Microbiology: Results for orders placed during the hospital encounter of 07/03/12  CULTURE, BLOOD (ROUTINE X 2)     Status: None   Collection Time  07/03/12  1:50 PM      Result Value Range Status   Specimen Description BLOOD LEFT FOREARM   Final   Special Requests BOTTLES DRAWN AEROBIC ONLY 10CC   Final   Culture  Setup Time 07/04/2012 04:59   Final   Culture     Final   Value: SALMONELLA SPECIES     Note: CRITICAL RESULT CALLED TO, READ BACK BY AND VERIFIED WITH: NADINE WELLINGTON@1042  ON [id number] BY [facility] FAXED TO [place] [facility] 07/10/12 BY CATAR     Note: Gram Stain Report Called to,Read Back By and Verified With: RACHEL FOUNTAIN 07/08/12 @ 12:33PM BY RUSCA.   Report Status PENDING   Incomplete   Organism ID, Bacteria SALMONELLA SPECIES   Final  URINE CULTURE     Status: None   Collection Time    07/03/12  2:51 PM      Result Value Range Status   Specimen Description URINE, CLEAN CATCH   Final   Special Requests Immunocompromised   Final   Culture  Setup Time 07/03/2012 15:45   Final   Colony Count NO GROWTH   Final   Culture NO GROWTH   Final   Report Status 07/05/2012 FINAL   Final  CLOSTRIDIUM DIFFICILE BY PCR     Status: None   Collection Time    07/03/12  3:54 PM      Result Value Range Status   C difficile by pcr NEGATIVE  NEGATIVE Final  CULTURE, BLOOD (ROUTINE X 2)     Status: None   Collection Time     07/03/12  4:20 PM      Result Value Range Status   Specimen Description BLOOD HAND LEFT   Final   Special Requests BOTTLES DRAWN AEROBIC AND ANAEROBIC 10CC   Final   Culture  Setup Time 07/04/2012 02:22   Final   Culture NO GROWTH 5 DAYS   Final   Report Status 07/10/2012 FINAL   Final  URINE CULTURE     Status: None   Collection Time    07/04/12 12:17 PM      Result Value Range Status   Specimen Description URINE, CATHETERIZED   Final   Special Requests NONE   Final   Culture  Setup Time 07/05/2012 02:41   Final   Colony Count NO GROWTH   Final   Culture NO GROWTH   Final   Report Status 07/05/2012 FINAL   Final  CULTURE, RESPIRATORY (NON-EXPECTORATED)     Status: None   Collection Time    07/04/12 12:20 PM      Result Value Range Status   Specimen Description TRACHEAL ASPIRATE   Final   Special Requests NONE   Final   Gram Stain     Final   Value: RARE WBC PRESENT, PREDOMINANTLY MONONUCLEAR     RARE SQUAMOUS EPITHELIAL CELLS PRESENT     NO ORGANISMS SEEN   Culture NO GROWTH 2 DAYS   Final   Report Status 07/07/2012 FINAL   Final  CLOSTRIDIUM DIFFICILE BY PCR     Status: None   Collection Time    07/04/12  4:08 PM      Result Value Range Status   C difficile by pcr NEGATIVE  NEGATIVE Final  CULTURE, BLOOD (ROUTINE X 2)     Status: None   Collection Time    07/04/12  5:30 PM      Result Value Range Status   Specimen Description BLOOD  HAND LEFT   Final   Special Requests BOTTLES DRAWN AEROBIC ONLY 5CC   Final   Culture  Setup Time 07/05/2012 03:29   Final   Culture NO GROWTH 5 DAYS   Final   Report Status 07/11/2012 FINAL   Final  CULTURE, BLOOD (ROUTINE X 2)     Status: None   Collection Time    07/04/12  8:14 PM      Result Value Range Status   Specimen Description BLOOD LEFT HAND   Final   Special Requests BOTTLES DRAWN AEROBIC ONLY 4CC   Final   Culture  Setup Time 07/05/2012 02:42   Final   Culture NO GROWTH 5 DAYS   Final   Report Status 07/11/2012 FINAL    Final    Coagulation Studies: No results found for this basename: LABPROT, INR,  in the last 72 hours  Urinalysis: No results found for this basename: COLORURINE, APPERANCEUR, LABSPEC, PHURINE, GLUCOSEU, HGBUR, BILIRUBINUR, KETONESUR, PROTEINUR, UROBILINOGEN, NITRITE, LEUKOCYTESUR,  in the last 72 hours    Imaging: No results found.   Medications:   . sodium chloride 10 mL/hr at 07/08/12 0416   . allopurinol  100 mg Oral Daily  . aspirin  81 mg Oral Daily  . calcium acetate  667 mg Oral 5 X Daily  . carvedilol  6.25 mg Oral BID WC  . cefTAZidime (FORTAZ)  IV  2 g Intravenous Q M,W,F-2000  . cycloSPORINE modified  75 mg Oral BID   And  . cycloSPORINE modified  100 mg Oral BID  . famotidine  20 mg Oral Q1200  . feeding supplement  1 Container Oral BID BM  . insulin aspart  0-15 Units Subcutaneous Q4H  . insulin glargine  12 Units Subcutaneous BID  . levothyroxine  88 mcg Oral QAC breakfast  . predniSONE  5 mg Oral Q breakfast  . rOPINIRole  1 mg Oral QHS  . sodium chloride  3 mL Intravenous Q12H   sodium chloride, acetaminophen, acetaminophen, hydrALAZINE, HYDROmorphone (DILAUDID) injection, labetalol, nitroGLYCERIN, ondansetron (ZOFRAN) IV, promethazine, sodium chloride  Assessment/ Plan:  1. Acute renal failure chronic kidney disease stage IV/V: Suspect evolution to dialysis dependent ESRD wants to dialyze at [facility]  2. Anemia: Suspect underlying anemia of chronic kidney disease now compounded by acute illness, initiated ESA therapy  3. CKD-MBD: will start binder when consistently taking PO intake (off NGT)   Patient accepted at [place] second   LOS: 8 Trenisha Lafavor W @TODAY @10 :39 AM

## 2012-07-11 NOTE — Progress Notes (Signed)
Physical Therapy Treatment Patient Details Name: Dale [name] MRN: [id number] DOB: 06-Jul-1965 Today's Date: 07/11/2012 Time: [id number] PT Time Calculation (min): 12 min  PT Assessment / Plan / Recommendation Comments on Treatment Session  Pt willing to try to exercise and increase activity.  However, he had increasing nausea with attempts;he was somewhat nauseous before he started.  Pt has not been able to tolerate activity for several days. Question if he will be able tolerate CIR therapies    Follow Up Recommendations  SNF     Does the patient have the potential to tolerate intense rehabilitation     Barriers to Discharge        Equipment Recommendations  None recommended by PT    Recommendations for Other Services    Frequency     Plan Discharge plan needs to be updated;Frequency remains appropriate    Precautions / Restrictions     Pertinent Vitals/Pain Pt limited by nausea    Mobility  Bed Mobility Details for Bed Mobility Assistance: pt is able to scoot around in bed to try to make himself more comfortable. Not able to roll or get to edge of bed due to nausea Ambulation/Gait Ambulation/Gait Assistance: Not tested (comment) Stairs: No Wheelchair Mobility Wheelchair Mobility: No    Exercises General Exercises - Lower Extremity Ankle Circles/Pumps: AAROM;Right;5 reps;Supine Quad Sets: AROM;Right;Left;5 reps;Supine   PT Diagnosis:    PT Problem List:   PT Treatment Interventions:     PT Goals Acute Rehab PT Goals PT Goal: Supine/Side to Sit - Progress: Not progressing PT Goal: Sit to Supine/Side - Progress: Not progressing PT Goal: Sit to Stand - Progress: Not progressing PT Goal: Stand to Sit - Progress: Not progressing PT Goal: Ambulate - Progress: Not progressing  Visit Information  Last PT Received On: 07/11/12 Assistance Needed: +1    Subjective Data  Subjective: "I'll try" pt also states he is very nauseated Patient Stated Goal: to do something    Cognition  Cognition Overall Cognitive Status: Appears within functional limits for tasks assessed/performed Arousal/Alertness: Awake/alert Orientation Level: Appears intact for tasks assessed Behavior During Session: [facility] for tasks performed    Balance     End of Session PT - End of Session Activity Tolerance: Treatment limited secondary to medical complications (Comment);Other (comment) (nausea, pt with dry heaving after attempts at exercise) Patient left: in bed Nurse Communication: Other (comment) (pt nausea)   GP    Donato Heinz. [place], [place] 07/11/2012, 11:01 AM

## 2012-07-11 NOTE — Progress Notes (Signed)
Patient currently not able to tolerate intense inpt rehab therapies. Limited by pain and nausea. SNF recommended by therapy at this time. I will follow [phone]

## 2012-07-11 NOTE — Progress Notes (Signed)
     [facility] Daily Rounding Note 07/11/2012, 10:24 AM  SUBJECTIVE:       Right abd pain and nausea persist. Better post meds this AM.  Tolerated some milk of the FL tray this AM.  Last BM was smear of soft BM on 3/17.  Generally feels lousy.  Awaiting transport to HD  OBJECTIVE:         Vital signs in last 24 hours:    Temp:  [97.5 F (36.4 C)-98.1 F (36.7 C)] 98 F (36.7 C) (03/19 0503) Pulse Rate:  [77-90] 77 (03/19 0503) Resp:  [18-20] 18 (03/19 0503) BP: (118-162)/(71-84) 118/76 mmHg (03/19 0503) SpO2:  [95 %-99 %] 95 % (03/19 0503) Weight:  [117.7 kg (259 lb 7.7 oz)] 117.7 kg (259 lb 7.7 oz) (03/18 2003) Last BM Date: 07/09/12 General: looks comfortable.  Not well looking  Heart: RRR Chest: clear.  No dyspnea Abdomen: soft, obese, BS hypoactive, tender on right and mid abdomen.  No G or R.  Extremities: no CCE. Neuro/Psych:  Depressed, not anxious.  Alert and appropriate.   Intake/Output from previous day: 03/18 0701 - 03/19 0700 In: 360 [P.O.:360] Out: 0   Intake/Output this shift:    Lab Results:  Recent Labs  07/09/12 0500  WBC 5.7  HGB 9.3*  HCT 27.3*  PLT 124*   BMET  Recent Labs  07/08/12 1050 07/09/12 0500  NA 134* 135  K 3.6 3.3*  CL 99 101  CO2 21 21  GLUCOSE 282* 212*  BUN 59* 69*  CREATININE 4.45* 4.64*  CALCIUM 8.1* 8.5   LFT  Recent Labs  07/09/12 0500 07/10/12 1003  PROT  --  6.4  ALBUMIN 2.2* 2.2*  AST  --  32  ALT  --  21  ALKPHOS  --  73  BILITOT  --  0.6  BILIDIR  --  0.3  IBILI  --  0.3    ASSESMENT: * Diarrhea, abd pain. Enterocolitis by CT scan 3/12. Given findings of Salmonella in blood, suspect  Salmonella enterocolitis.  52013 colonoscopy with left tics, int rrhoids, pale to purplish discolration throughout: right colon biopsies confirmed ischemic change, no pathology on left colon BX  Diarrhea resolved but pain and nausea continue.  * GNR/Salmonella bacteremia with sepsis. Dr Linus Salmons of ID supports 14 days  of Fortaz through 3/25 * Diabetic gastroparesis  * GERD. EGD 08/2006. Unable to pull up report.  * ESRD  * IDDM. Poorly controlled.  * Sepsis with resp failure. .  * Chronic dz anemia.  * Morbid obesity  * S/p renal transplant, now failed but still on cyclosporine/prednisone.  * OSA, refuses Cpap.    PLAN: *  Continue Fortaz through 3/25.  PRN anelgesics and antiemetics.  Leave on FL diet for now.  *  Consider another KUB if sxs persist into next 24 hours.    LOS: 8 days   Dale [name]  07/11/2012, 10:24 AM Pager: [phone]

## 2012-07-11 NOTE — Progress Notes (Signed)
Patient removed 1.6 liters of fluid on hemodialysis. Arterial side of hemodialysis lines clotted. Remaining 30 minutes not achieved. Dr Justin Mend aware. Patient has no complaints at this time

## 2012-07-11 NOTE — Progress Notes (Signed)
[facility]  Date of Admission:  07/03/2012  Antibiotics: [name] day 3  Subjective: No acute events, + salmonella  Objective: Temp:  [97.5 F (36.4 C)-98.1 F (36.7 C)] 98 F (36.7 C) (03/19 0503) Pulse Rate:  [77-90] 77 (03/19 0503) Resp:  [18-20] 18 (03/19 0503) BP: (118-162)/(71-84) 118/76 mmHg (03/19 0503) SpO2:  [95 %-99 %] 95 % (03/19 0503) Weight:  [259 lb 7.7 oz (117.7 kg)] 259 lb 7.7 oz (117.7 kg) (03/18 2003)  Gen: awake, and alert, nad  Lab Results Lab Results  Component Value Date   WBC 5.7 07/09/2012   HGB 9.3* 07/09/2012   HCT 27.3* 07/09/2012   MCV 91.9 07/09/2012   PLT 124* 07/09/2012    Lab Results  Component Value Date   CREATININE 4.64* 07/09/2012   BUN 69* 07/09/2012   NA 135 07/09/2012   K 3.3* 07/09/2012   CL 101 07/09/2012   CO2 21 07/09/2012    Lab Results  Component Value Date   ALT 21 07/10/2012   AST 32 07/10/2012   ALKPHOS 73 07/10/2012   BILITOT 0.6 07/10/2012      Microbiology: Recent Results (from the past 240 hour(s))  CULTURE, BLOOD (ROUTINE X 2)     Status: None   Collection Time    07/03/12  1:50 PM      Result Value Range Status   Specimen Description BLOOD LEFT FOREARM   Final   Special Requests BOTTLES DRAWN AEROBIC ONLY 10CC   Final   Culture  Setup Time 07/04/2012 04:59   Final   Culture     Final   Value: SALMONELLA SPECIES     Note: CRITICAL RESULT CALLED TO, READ BACK BY AND VERIFIED WITH: NADINE WELLINGTON@1042  ON [id number] BY [facility] FAXED TO [facility] 07/10/12 BY CATAR     Note: Gram Stain Report Called to,Read Back By and Verified With: RACHEL FOUNTAIN 07/08/12 @ 12:33PM BY RUSCA.   Report Status PENDING   Incomplete   Organism ID, Bacteria SALMONELLA SPECIES   Final  URINE CULTURE     Status: None   Collection Time    07/03/12  2:51 PM      Result Value Range Status   Specimen Description URINE, CLEAN CATCH   Final   Special Requests Immunocompromised   Final   Culture  Setup Time 07/03/2012 15:45   Final   Colony Count NO GROWTH   Final   Culture NO GROWTH   Final   Report Status 07/05/2012 FINAL   Final  CLOSTRIDIUM DIFFICILE BY PCR     Status: None   Collection Time    07/03/12  3:54 PM      Result Value Range Status   C difficile by pcr NEGATIVE  NEGATIVE Final  CULTURE, BLOOD (ROUTINE X 2)     Status: None   Collection Time    07/03/12  4:20 PM      Result Value Range Status   Specimen Description BLOOD HAND LEFT   Final   Special Requests BOTTLES DRAWN AEROBIC AND ANAEROBIC 10CC   Final   Culture  Setup Time 07/04/2012 02:22   Final   Culture NO GROWTH 5 DAYS   Final   Report Status 07/10/2012 FINAL   Final  URINE CULTURE     Status: None   Collection Time    07/04/12 12:17 PM      Result Value Range Status   Specimen Description URINE,  CATHETERIZED   Final   Special Requests NONE   Final   Culture  Setup Time 07/05/2012 02:41   Final   Colony Count NO GROWTH   Final   Culture NO GROWTH   Final   Report Status 07/05/2012 FINAL   Final  CULTURE, RESPIRATORY (NON-EXPECTORATED)     Status: None   Collection Time    07/04/12 12:20 PM      Result Value Range Status   Specimen Description TRACHEAL ASPIRATE   Final   Special Requests NONE   Final   Gram Stain     Final   Value: RARE WBC PRESENT, PREDOMINANTLY MONONUCLEAR     RARE SQUAMOUS EPITHELIAL CELLS PRESENT     NO ORGANISMS SEEN   Culture NO GROWTH 2 DAYS   Final   Report Status 07/07/2012 FINAL   Final  CLOSTRIDIUM DIFFICILE BY PCR     Status: None   Collection Time    07/04/12  4:08 PM      Result Value Range Status   C difficile by pcr NEGATIVE  NEGATIVE Final  CULTURE, BLOOD (ROUTINE X 2)     Status: None   Collection Time    07/04/12  5:30 PM      Result Value Range Status   Specimen Description BLOOD HAND LEFT   Final   Special Requests BOTTLES DRAWN AEROBIC ONLY 5CC   Final   Culture  Setup Time 07/05/2012 03:29   Final   Culture     Final   Value:         BLOOD CULTURE RECEIVED NO GROWTH TO DATE CULTURE WILL BE HELD FOR 5 DAYS BEFORE ISSUING A FINAL NEGATIVE REPORT   Report Status PENDING   Incomplete  CULTURE, BLOOD (ROUTINE X 2)     Status: None   Collection Time    07/04/12  8:14 PM      Result Value Range Status   Specimen Description BLOOD LEFT HAND   Final   Special Requests BOTTLES DRAWN AEROBIC ONLY 4CC   Final   Culture  Setup Time 07/05/2012 02:42   Final   Culture     Final   Value:        BLOOD CULTURE RECEIVED NO GROWTH TO DATE CULTURE WILL BE HELD FOR 5 DAYS BEFORE ISSUING A FINAL NEGATIVE REPORT   Report Status PENDING   Incomplete    Studies/Results: No results found.  Assessment/Plan: 1) Enterocolitis with Salmonella -on fortaz and will complete through 3/25 which will be 14 days of therapy, no indication for prolonged therapy in this patient  Scharlene Gloss, [place] for [facility] [place] pager   07/11/2012, 9:02 AM

## 2012-07-11 NOTE — Progress Notes (Signed)
RT spoke with patient and asked if he wanted to use CPAP tonight.  Patient stated that he did not want to be placed on CPAP at all.  RT will continue to monitor.

## 2012-07-11 NOTE — Progress Notes (Signed)
Patient ID: Dale [name], male   DOB: Aug 31, 1965, 47 y.o.   MRN: [id number]    Subjective: Pt still with some pain.  Ate some breakfast this morning after nausea medicine and pain medicine  Objective: Vital signs in last 24 hours: Temp:  [97.5 F (36.4 C)-98.1 F (36.7 C)] 98 F (36.7 C) (03/19 0503) Pulse Rate:  [77-90] 77 (03/19 0503) Resp:  [18-20] 18 (03/19 0503) BP: (118-162)/(71-84) 118/76 mmHg (03/19 0503) SpO2:  [95 %-99 %] 95 % (03/19 0503) Weight:  [259 lb 7.7 oz (117.7 kg)] 259 lb 7.7 oz (117.7 kg) (03/18 2003) Last BM Date: 07/09/12  Intake/Output from previous day: 03/18 0701 - 03/19 0700 In: 360 [P.O.:360] Out: 0  Intake/Output this shift:    PE: Abd: soft, tender most on right side, +BS, obese  Lab Results:   Recent Labs  07/09/12 0500  WBC 5.7  HGB 9.3*  HCT 27.3*  PLT 124*   BMET  Recent Labs  07/08/12 1050 07/09/12 0500  NA 134* 135  K 3.6 3.3*  CL 99 101  CO2 21 21  GLUCOSE 282* 212*  BUN 59* 69*  CREATININE 4.45* 4.64*  CALCIUM 8.1* 8.5   PT/INR No results found for this basename: LABPROT, INR,  in the last 72 hours CMP     Component Value Date/Time   NA 135 07/09/2012 0500   K 3.3* 07/09/2012 0500   CL 101 07/09/2012 0500   CO2 21 07/09/2012 0500   GLUCOSE 212* 07/09/2012 0500   BUN 69* 07/09/2012 0500   CREATININE 4.64* 07/09/2012 0500   CALCIUM 8.5 07/09/2012 0500   PROT 6.4 07/10/2012 1003   ALBUMIN 2.2* 07/10/2012 1003   AST 32 07/10/2012 1003   ALT 21 07/10/2012 1003   ALKPHOS 73 07/10/2012 1003   BILITOT 0.6 07/10/2012 1003   GFRNONAA 14* 07/09/2012 0500   GFRAA 16* 07/09/2012 0500   Lipase     Component Value Date/Time   LIPASE 46 07/10/2012 1003       Studies/Results: No results found.  Anti-infectives: Anti-infectives   Start     Dose/Rate Route Frequency Ordered Stop   07/11/12 2000  cefTAZidime (FORTAZ) 2 g in dextrose 5 % 50 mL IVPB     2 g 100 mL/hr over 30 Minutes Intravenous Every M-W-F (2000) 07/09/12 0730      07/09/12 0730  cefTAZidime (FORTAZ) 2 g in dextrose 5 % 50 mL IVPB     2 g 100 mL/hr over 30 Minutes Intravenous  Once 07/09/12 0729 07/09/12 1032   07/05/12 2100  vancomycin (VANCOCIN) 750 mg in sodium chloride 0.9 % 150 mL IVPB     750 mg 150 mL/hr over 60 Minutes Intravenous  Once 07/05/12 2029 07/05/12 2200   07/05/12 1800  vancomycin (VANCOCIN) 50 mg/mL oral solution 500 mg  Status:  Discontinued     500 mg Per Tube 4 times per day 07/05/12 1511 07/07/12 1617   07/05/12 1600  vancomycin (VANCOCIN) 1,750 mg in sodium chloride 0.9 % 500 mL IVPB  Status:  Discontinued     1,750 mg 250 mL/hr over 120 Minutes Intravenous Every 48 hours 07/03/12 1541 07/03/12 1918   07/04/12 1700  vancomycin (VANCOCIN) IVPB 1000 mg/200 mL premix     1,000 mg 200 mL/hr over 60 Minutes Intravenous  Once 07/04/12 1648 07/04/12 1851   07/04/12 1600  ceFEPIme (MAXIPIME) 1 g in dextrose 5 % 50 mL IVPB  Status:  Discontinued     1 g  100 mL/hr over 30 Minutes Intravenous Every 24 hours 07/04/12 0858 07/07/12 1617   07/04/12 0900  metroNIDAZOLE (FLAGYL) IVPB 500 mg  Status:  Discontinued     500 mg 100 mL/hr over 60 Minutes Intravenous Every 8 hours 07/04/12 0849 07/07/12 1617   07/04/12 0000  vancomycin (VANCOCIN) 50 mg/mL oral solution 250 mg  Status:  Discontinued     250 mg Oral 4 times per day 07/03/12 1918 07/04/12 0849   07/03/12 2200  metroNIDAZOLE (FLAGYL) tablet 500 mg  Status:  Discontinued     500 mg Oral 3 times per day 07/03/12 1918 07/04/12 0849   07/03/12 1800  vancomycin (VANCOCIN) 50 mg/mL oral solution 125 mg  Status:  Discontinued     125 mg Oral 4 times per day 07/03/12 1537 07/03/12 1918   07/03/12 1700  oseltamivir (TAMIFLU) capsule 75 mg  Status:  Discontinued     75 mg Oral Daily 07/03/12 1540 07/07/12 1617   07/03/12 1530  vancomycin (VANCOCIN) 2,000 mg in sodium chloride 0.9 % 500 mL IVPB     2,000 mg 250 mL/hr over 120 Minutes Intravenous  Once 07/03/12 1526 07/03/12 1913    07/03/12 1530  ceFEPIme (MAXIPIME) 1 g in dextrose 5 % 50 mL IVPB  Status:  Discontinued     1 g 100 mL/hr over 30 Minutes Intravenous Every 24 hours 07/03/12 1526 07/03/12 1918       Assessment/Plan  1. Salmonella enterocolitis  Plan: 1. Agree with GI for symptomatic treatment and IV abx therapy of salmonella enterocolitis.  There is nothing surgical to do for this.  We will sign off.  Please call as needed.   LOS: 8 days    Tammy Wickliffe E 07/11/2012, 9:26 AM Pager: [phone]

## 2012-07-11 NOTE — Progress Notes (Signed)
[name]  PROGRESS NOTE  Dale [name] [id number] DOB: 1965/09/16 DOA: 07/03/2012 PCP: Windy Kalata, MD  Brief narrative: 47 year old male admitted 07/03/2012 with chest pain after admission 06/19/2038 with myalgias 20 bowel movements with watery stool no blood and flulike symptoms with fever Eventually found to be salmonella positive, admission lactic acid 2.7 white count 18 on chronic steroids given history of renal transplant [cadaveric, 1995]  Past medical history-As per Problem list Chart reviewed as below- Is a type I diabetic since age 51 Admission 11/18/2000 with chest pain artery noncardiogenic Admission 09/24/2002 with cellulitis of the right thigh and sepsis Admission 08/13/2003 with right heel fasciitis and chronic Wagner grade 3 ulcer status post calcaneal excision February 25/05 Admission 08/19/2004 with acute appendicitis status post laparoscopic appendectomy 08/19/2004 Admission 10/21/2004 for left fifth ray amputation-notable history of MRSA Admission 07/22/2011 with hemoglobin 6.8-had a recent transfusion 3 weeks prior to this admission   Consultants:  GI-Pyrtle  Nephrology-Webb  ID -[place]  Gen suregry-[facility]  Procedures:  CT abdomen pelvis 07/04/12 = circumferential thickening of distal small bowel proximal colon in keeping with enterocolitis, ischemia not excluded  Chest x-ray two-view 07/03/2012 = no edema or consolidation  Final x-ray 07/06/2012 = cardiomegaly with indistinct on the vascular favoring probably venous hypertension  ANTIBIOTICS:  Vanc IV 3/12 >>> 3/15  Vanc PO 3/13 >>> 3/15  Flagyl 3/11 >>> 3/15  Cefepime 3/11 >>> 3/15  Tamiflu 3/10 >>> 3/15  [name] 3/17 >>>>>07/17/12     Subjective  Sleepy and comfortable appearing but "Feels horrible".  States has n/v-abdominal pain is the worse complaint.   States is 9/10 when it is most severe Tolerated only shake this am No stool since 3.17 which was dark brown per him   Objective    Interim  History: none  Telemetry: Non tele  Objective: Filed Vitals:   07/10/12 1445 07/10/12 1716 07/10/12 2003 07/11/12 0503  BP: 157/84 151/82 162/79 118/76  Pulse: 79 90 85 77  Temp: 97.8 F (36.6 C) 97.5 F (36.4 C) 98.1 F (36.7 C) 98 F (36.7 C)  TempSrc: Oral Oral Oral Oral  Resp: 20 20 20 18   Height:      Weight:   117.7 kg (259 lb 7.7 oz)   SpO2: 98% 97% 97% 95%    Intake/Output Summary (Last 24 hours) at 07/11/12 1114 Last data filed at 07/11/12 0504  Gross per 24 hour  Intake    240 ml  Output      0 ml  Net    240 ml    Exam:  General: Alert pleasant oriented Cardiovascular: S1-S2 no murmur rub or gallop Respiratory: Clinically clear Abdomen: Obese nontender nondistended Skin no lower extremity edema Neuro grossly intact  Data Reviewed: Basic Metabolic Panel:  Recent Labs Lab 07/05/12 0355 07/05/12 1821 07/06/12 0830 07/06/12 1026 07/07/12 0410 07/07/12 0411 07/08/12 1050 07/09/12 0500  NA 138 134* 137  --   --  139 134* 135  K 2.7* 4.3 4.3  --   --  3.5 3.6 3.3*  CL 101 98 98  --   --  101 99 101  CO2 19 16* 21  --   --  22 21 21   GLUCOSE 115* 328* 268*  --   --  186* 282* 212*  BUN 81* 87* 55*  --   --  71* 59* 69*  CREATININE 7.87* 8.52* 5.63*  --   --  6.27* 4.45* 4.64*  CALCIUM 7.7* 7.4* 7.6*  --   --  7.9* 8.1* 8.5  MG 1.6  --   --  2.3 2.3  --   --   --   PHOS 4.4  --  5.5*  --   --  5.6*  --  4.5   Liver Function Tests:  Recent Labs Lab 07/06/12 0830 07/07/12 0411 07/09/12 0500 07/10/12 1003  AST  --   --   --  32  ALT  --   --   --  21  ALKPHOS  --   --   --  73  BILITOT  --   --   --  0.6  PROT  --   --   --  6.4  ALBUMIN 2.0* 2.0* 2.2* 2.2*    Recent Labs Lab 07/10/12 1003  LIPASE 46   No results found for this basename: AMMONIA,  in the last 168 hours CBC:  Recent Labs Lab 07/07/12 0410 07/07/12 1147 07/07/12 1725 07/08/12 07/09/12 0500  WBC 9.2 9.4 8.5 8.0 5.7  HGB 9.2* 9.4* 9.3* 8.6* 9.3*  HCT 28.0*  28.3* 28.4* 25.0* 27.3*  MCV 93.6 94.0 95.0 93.6 91.9  PLT PLATELET CLUMPS NOTED ON SMEAR, COUNT APPEARS DECREASED 126* 114* 131* 124*   Cardiac Enzymes:  Recent Labs Lab 07/04/12 1225  TROPONINI 0.60*   BNP: No components found with this basename: POCBNP,  CBG:  Recent Labs Lab 07/10/12 1712 07/10/12 2005 07/10/12 2356 07/11/12 0405 07/11/12 0801  GLUCAP 169* 251* 233* 165* 124*    Recent Results (from the past 240 hour(s))  CULTURE, BLOOD (ROUTINE X 2)     Status: None   Collection Time    07/03/12  1:50 PM      Result Value Range Status   Specimen Description BLOOD LEFT FOREARM   Final   Special Requests BOTTLES DRAWN AEROBIC ONLY 10CC   Final   Culture  Setup Time 07/04/2012 04:59   Final   Culture     Final   Value: SALMONELLA SPECIES     Note: CRITICAL RESULT CALLED TO, READ BACK BY AND VERIFIED WITH: NADINE WELLINGTON@1042  ON [id number] BY [facility] FAXED TO [facility] 07/10/12 BY CATAR     Note: Gram Stain Report Called to,Read Back By and Verified With: RACHEL FOUNTAIN 07/08/12 @ 12:33PM BY RUSCA.   Report Status PENDING   Incomplete   Organism ID, Bacteria SALMONELLA SPECIES   Final  URINE CULTURE     Status: None   Collection Time    07/03/12  2:51 PM      Result Value Range Status   Specimen Description URINE, CLEAN CATCH   Final   Special Requests Immunocompromised   Final   Culture  Setup Time 07/03/2012 15:45   Final   Colony Count NO GROWTH   Final   Culture NO GROWTH   Final   Report Status 07/05/2012 FINAL   Final  CLOSTRIDIUM DIFFICILE BY PCR     Status: None   Collection Time    07/03/12  3:54 PM      Result Value Range Status   C difficile by pcr NEGATIVE  NEGATIVE Final  CULTURE, BLOOD (ROUTINE X 2)     Status: None   Collection Time    07/03/12  4:20 PM      Result Value Range Status   Specimen Description BLOOD HAND LEFT   Final   Special Requests BOTTLES DRAWN AEROBIC AND ANAEROBIC 10CC   Final   Culture  Setup Time  07/04/2012 02:22   Final   Culture NO GROWTH 5 DAYS   Final   Report Status 07/10/2012 FINAL   Final  URINE CULTURE     Status: None   Collection Time    07/04/12 12:17 PM      Result Value Range Status   Specimen Description URINE, CATHETERIZED   Final   Special Requests NONE   Final   Culture  Setup Time 07/05/2012 02:41   Final   Colony Count NO GROWTH   Final   Culture NO GROWTH   Final   Report Status 07/05/2012 FINAL   Final  CULTURE, RESPIRATORY (NON-EXPECTORATED)     Status: None   Collection Time    07/04/12 12:20 PM      Result Value Range Status   Specimen Description TRACHEAL ASPIRATE   Final   Special Requests NONE   Final   Gram Stain     Final   Value: RARE WBC PRESENT, PREDOMINANTLY MONONUCLEAR     RARE SQUAMOUS EPITHELIAL CELLS PRESENT     NO ORGANISMS SEEN   Culture NO GROWTH 2 DAYS   Final   Report Status 07/07/2012 FINAL   Final  CLOSTRIDIUM DIFFICILE BY PCR     Status: None   Collection Time    07/04/12  4:08 PM      Result Value Range Status   C difficile by pcr NEGATIVE  NEGATIVE Final  CULTURE, BLOOD (ROUTINE X 2)     Status: None   Collection Time    07/04/12  5:30 PM      Result Value Range Status   Specimen Description BLOOD HAND LEFT   Final   Special Requests BOTTLES DRAWN AEROBIC ONLY 5CC   Final   Culture  Setup Time 07/05/2012 03:29   Final   Culture NO GROWTH 5 DAYS   Final   Report Status 07/11/2012 FINAL   Final  CULTURE, BLOOD (ROUTINE X 2)     Status: None   Collection Time    07/04/12  8:14 PM      Result Value Range Status   Specimen Description BLOOD LEFT HAND   Final   Special Requests BOTTLES DRAWN AEROBIC ONLY 4CC   Final   Culture  Setup Time 07/05/2012 02:42   Final   Culture NO GROWTH 5 DAYS   Final   Report Status 07/11/2012 FINAL   Final     Studies:              All Imaging reviewed and is as per above notation   Scheduled Meds: . allopurinol  100 mg Oral Daily  . aspirin  81 mg Oral Daily  . calcium acetate  667  mg Oral 5 X Daily  . carvedilol  6.25 mg Oral BID WC  . cefTAZidime (FORTAZ)  IV  2 g Intravenous Q M,W,F-2000  . cycloSPORINE modified  75 mg Oral BID   And  . cycloSPORINE modified  100 mg Oral BID  . famotidine  20 mg Oral Q1200  . feeding supplement  1 Container Oral BID BM  . insulin aspart  0-15 Units Subcutaneous Q4H  . insulin glargine  12 Units Subcutaneous BID  . levothyroxine  88 mcg Oral QAC breakfast  . predniSONE  5 mg Oral Q breakfast  . rOPINIRole  1 mg Oral QHS  . sodium chloride  3 mL Intravenous Q12H   Continuous Infusions: . sodium chloride 10 mL/hr at 07/08/12 0416  Assessment/Plan: 1. Salmonella bacteremia with severe septic shock - patient improved with IV fluid support. He did not require pressors. Patient received empiric IV antibiotics including Flagyl and cefepime. On March 17 the patient was started on IV Fortaz. On 07/10/12 the lab called final speciation result on the GNR bacteremia as Salmonella bacteremia - ID Dr. Linus Salmons called and he recommended 1 more week of iv Fortaz with HD. Stop date 07/17/12  2. Respiratory failure - secondary to abdominal distention and pain - intubated and mechanically ventilated from March 12 until March 14. Extubated without difficulty. Currently off oxygen supplements.  3. Enterocolitis - due to salmonella. Diet now back to full liquids as patient was unable to tolerate regular diet-attempt gradually per gastroenterology instructions as he is on fluids per them 4. ESRD s/p renal cadaveric transplant 1995 - resumed HD during current admission - patient to be discharged on dialysis, Tuesday Thursday Saturday. 5. DM type I - on insulin pump at home . On lantus and ssi in house. Blood sugars between 124 and 233 6. severe abdominal pain and diarrheal illness-C. difficile negative x2, wonder if this may be secondary to gastroparesis. Added metoclopramide Phenergan 3 times a day a.c. at bedtime to see if patient has bowel movements and  resolution of pain-if worse would CT scan abdomen to rule out further concerns as complication of Salmonella 7. hypokalemia and hypocalcemia during the critical care time in the ICU-repleted  8. patient on chronic immunosuppression for renal transplant including 5 mg of prednisone daily. This will need to be tapered as an out patient   Code Status: Full Family Communication: None at bedside Disposition Plan: Patient set up for dialysis as an outpatient Tuesday Thursday Saturday Dixon, MD  [facility] Pager [phone] 07/11/2012, 11:14 AM    LOS: 8 days

## 2012-07-11 NOTE — Progress Notes (Signed)
OT Cancellation Note  Patient Details Name: Dale [name] MRN: [id number] DOB: 1965-10-29   Cancelled Treatment:    Reason Eval/Treat Not Completed: Medical issues which prohibited therapy Pt still not feeling well with abdominal pain and nausea.  Does not feel he can participate in therapy at this time.  Will continue to check back.  [place] OTR/L Pager number [phone] 07/11/2012, 9:16 AM

## 2012-07-12 ENCOUNTER — Inpatient Hospital Stay (HOSPITAL_COMMUNITY): Payer: Medicare Other

## 2012-07-12 LAB — GLUCOSE, CAPILLARY
Glucose-Capillary: 115 mg/dL — ABNORMAL HIGH (ref 70–99)
Glucose-Capillary: 158 mg/dL — ABNORMAL HIGH (ref 70–99)
Glucose-Capillary: 241 mg/dL — ABNORMAL HIGH (ref 70–99)
Glucose-Capillary: 265 mg/dL — ABNORMAL HIGH (ref 70–99)
Glucose-Capillary: 430 mg/dL — ABNORMAL HIGH (ref 70–99)
Glucose-Capillary: 523 mg/dL — ABNORMAL HIGH (ref 70–99)
Glucose-Capillary: 562 mg/dL (ref 70–99)

## 2012-07-12 MED ORDER — INSULIN ASPART 100 UNIT/ML ~~LOC~~ SOLN: 15.0000 [IU] | Freq: Once | SUBCUTANEOUS | Status: DC

## 2012-07-12 MED ORDER — CARVEDILOL 6.25 MG PO TABS
6.2500 mg | ORAL_TABLET | ORAL | Status: DC
  Administered 2012-07-13: 6.25 mg via ORAL
  Filled 2012-07-12: qty 1

## 2012-07-12 NOTE — Progress Notes (Signed)
Patient ID: Dale [name], male   DOB: 1966/02/09, 47 y.o.   MRN: [id number] [facility] Progress Note  Subjective: Feels a little better- passing a lot of gas-still hurting but not as severe.  Tired of liquid diet and asking for solid food.  KUB consistent with colonic ileus -transverse colon 6.9 cm   Objective:  Vital signs in last 24 hours: Temp:  [97.5 F (36.4 C)-98.6 F (37 C)] 98.6 F (37 C) (03/20 0911) Pulse Rate:  [78-85] 80 (03/20 0911) Resp:  [16-20] 18 (03/20 0911) BP: (87-162)/(46-91) 137/75 mmHg (03/20 0911) SpO2:  [96 %-98 %] 97 % (03/20 0911) Weight:  [259 lb 7.7 oz (117.7 kg)-263 lb 3.7 oz (119.4 kg)] 259 lb 7.7 oz (117.7 kg) (03/19 2048) Last BM Date: 07/09/12 General:   Alert,  Well-developed,    in NAD Heart:  Regular rate and rhythm; no murmurs Pulm;clear Abdomen: large,tender bilateral lower abdomen -  Bs  Active  No rebound Extremities:  Without edema. Neurologic:  Alert and  oriented x4;  grossly normal neurologically. Psych:  Alert and cooperative. Normal mood and affect.  Intake/Output from previous day: 03/19 0701 - 03/20 0700 In: 1080 [P.O.:1080] Out: 1623  Intake/Output this shift:    Lab Results:  Recent Labs  07/11/12 1248  WBC 4.8  HGB 10.8*  HCT 32.6*  PLT 157   BMET  Recent Labs  07/11/12 1247  NA 135  K 3.3*  CL 98  CO2 25  GLUCOSE 165*  BUN 49*  CREATININE 4.52*  CALCIUM 8.7   LFT  Recent Labs  07/10/12 1003 07/11/12 1247  PROT 6.4  --   ALBUMIN 2.2* 2.1*  AST 32  --   ALT 21  --   ALKPHOS 73  --   BILITOT 0.6  --   BILIDIR 0.3  --   IBILI 0.3  --      Assessment / Plan: 72  46 yo male  With Salmonella sepsis with enterocolitis- day # 9 hospital ization- he is clinically improving though has ileus which is not unexpected - will give dulcolax supp BID- try to stimulate colon. Consider  Follow up Ct- encourage ambulation , and decreased narcotics Will add crackers.mashed potatoes etc today, and  hope  Can advance to solids soon. Principal Problem:   Salmonella bacteremia Active Problems:   History of renal transplant   S/P BKA (below knee amputation)   Obstructive sleep apnea   Anemia   Diabetes mellitus   ESRD (end stage renal disease)   Fever and chills   Diarrhea   Nausea   Leukocytosis   AKI (acute kidney injury)   Acute respiratory failure   Septic shock   Metabolic acidosis   Salmonella gastroenteritis     LOS: 9 days   Jaylinn Hellenbrand  07/12/2012, 9:57 AM

## 2012-07-12 NOTE — Progress Notes (Signed)
MD notified of CBG recheck, info passed to oncoming shift.

## 2012-07-12 NOTE — Progress Notes (Signed)
Pt with cbg of 430. MD notified, awaiting orders. Will continue to monitor.

## 2012-07-12 NOTE — Progress Notes (Signed)
Dale [name]  PROGRESS NOTE  Dale [name] [id number] DOB: [birth year] DOA: 07/03/2012 PCP: Windy Kalata, MD  Brief narrative: 47 year old male admitted 07/03/2012 with chest pain after admission 06/19/2038 with myalgias 20 bowel movements with watery stool no blood and flulike symptoms with fever Eventually found to be salmonella positive, admission lactic acid 2.7 white count 18 on chronic steroids given history of renal transplant [cadaveric, 1995]  Past medical history-As per Problem list Chart reviewed as below- Is a type I diabetic since age 76 Admission 11/18/2000 with chest pain artery noncardiogenic Admission 09/24/2002 with cellulitis of the right thigh and sepsis Admission 08/13/2003 with right heel fasciitis and chronic Wagner grade 3 ulcer status post calcaneal excision February 25/05 Admission 08/19/2004 with acute appendicitis status post laparoscopic appendectomy 08/19/2004 Admission 10/21/2004 for left fifth ray amputation-notable history of MRSA Admission 07/22/2011 with hemoglobin 6.8-had a recent transfusion 3 weeks prior to this admission   Consultants:  GI-Pyrtle  Nephrology-Webb  ID -Comer  Gen suregry-L[facility]ton  Procedures:  CT abdomen pelvis 07/04/12 = circumferential thickening of distal small bowel proximal colon in keeping with enterocolitis, ischemia not excluded  Chest x-ray two-view 07/03/2012 = no edema or consolidation  Final x-ray 07/06/2012 = cardiomegaly with indistinct on the vascular favoring probably venous hypertension  Acute abd series 3.20=Abnormal although nonspecific bowel gas pattern as noted above.   ANTIBIOTICS:  Vanc IV 3/12 >>> 3/15  Vanc PO 3/13 >>> 3/15  Flagyl 3/11 >>> 3/15  Cefepime 3/11 >>> 3/15  Tamiflu 3/10 >>> 3/15  [name] 3/17 >>>>>07/17/12     Subjective  feels better.  Asks "why are you seeing me as well as GI doctor" No n/v/sob/cp No stol but passing gas   Objective    Interim History: none  Telemetry: Non  tele  Objective: Filed Vitals:   07/11/12 1507 07/11/12 2048 07/12/12 0421 07/12/12 0911  BP: 135/67 142/85 149/91 137/75  Pulse: 82 85 84 80  Temp: 98.3 F (36.8 C) 98.3 F (36.8 C) 98.5 F (36.9 C) 98.6 F (37 C)  TempSrc: Oral Oral Oral Oral  Resp: 20 20 20 18   Height:      Weight: 117.7 kg (259 lb 7.7 oz) 117.7 kg (259 lb 7.7 oz)    SpO2:  97% 97% 97%    Intake/Output Summary (Last 24 hours) at 07/12/12 1125 Last data filed at 07/12/12 0441  Gross per 24 hour  Intake    840 ml  Output   1623 ml  Net   -783 ml    Exam:  General: Alert pleasant oriented Cardiovascular: S1-S2 no murmur rub or gallop Respiratory: Clinically clear Abdomen: Obese nontender nondistended Skin no lower extremity edema Neuro grossly intact  Data Reviewed: Basic Metabolic Panel:  Recent Labs Lab 07/06/12 0830 07/06/12 1026 07/07/12 0410 07/07/12 0411 07/08/12 1050 07/09/12 0500 07/11/12 1247  NA 137  --   --  139 134* 135 135  K 4.3  --   --  3.5 3.6 3.3* 3.3*  CL 98  --   --  101 99 101 98  CO2 21  --   --  22 21 21 25   GLUCOSE 268*  --   --  186* 282* 212* 165*  BUN 55*  --   --  71* 59* 69* 49*  CREATININE 5.63*  --   --  6.27* 4.45* 4.64* 4.52*  CALCIUM 7.6*  --   --  7.9* 8.1* 8.5 8.7  MG  --  2.3 2.3  --   --   --   --  PHOS 5.5*  --   --  5.6*  --  4.5 4.7*   Liver Function Tests:  Recent Labs Lab 07/06/12 0830 07/07/12 0411 07/09/12 0500 07/10/12 1003 07/11/12 1247  AST  --   --   --  32  --   ALT  --   --   --  21  --   ALKPHOS  --   --   --  73  --   BILITOT  --   --   --  0.6  --   PROT  --   --   --  6.4  --   ALBUMIN 2.0* 2.0* 2.2* 2.2* 2.1*    Recent Labs Lab 07/10/12 1003  LIPASE 46   No results found for this basename: AMMONIA,  in the last 168 hours CBC:  Recent Labs Lab 07/07/12 1147 07/07/12 1725 07/08/12 07/09/12 0500 07/11/12 1248  WBC 9.4 8.5 8.0 5.7 4.8  HGB 9.4* 9.3* 8.6* 9.3* 10.8*  HCT 28.3* 28.4* 25.0* 27.3* 32.6*  MCV  94.0 95.0 93.6 91.9 93.1  PLT 126* 114* 131* 124* 157   Cardiac Enzymes: No results found for this basename: CKTOTAL, CKMB, CKMBINDEX, TROPONINI,  in the last 168 hours BNP: No components found with this basename: POCBNP,  CBG:  Recent Labs Lab 07/11/12 1649 07/11/12 2052 07/12/12 0011 07/12/12 0420 07/12/12 0729  GLUCAP 201* 339* 265* 158* 115*    Recent Results (from the past 240 hour(s))  CULTURE, BLOOD (ROUTINE X 2)     Status: None   Collection Time    07/03/12  1:50 PM      Result Value Range Status   Specimen Description BLOOD LEFT FOREARM   Final   Special Requests BOTTLES DRAWN AEROBIC ONLY 10CC   Final   Culture  Setup Time 07/04/2012 04:59   Final   Culture     Final   Value: SALMONELLA SPECIES     Note: CRITICAL RESULT CALLED TO, READ BACK BY AND VERIFIED WITH: NADINE WELLINGTON@1042  ON [id number] BY [facility] FAXED TO [facility] 07/10/12 BY CATAR Referred to [facility] in [place], [place] for      Serotyping.     Note: Gram Stain Report Called to,Read Back By and Verified With: RACHEL FOUNTAIN 07/08/12 @ 12:33PM BY RUSCA.   Report Status PENDING   Incomplete   Organism ID, Bacteria SALMONELLA SPECIES   Final  URINE CULTURE     Status: None   Collection Time    07/03/12  2:51 PM      Result Value Range Status   Specimen Description URINE, CLEAN CATCH   Final   Special Requests Immunocompromised   Final   Culture  Setup Time 07/03/2012 15:45   Final   Colony Count NO GROWTH   Final   Culture NO GROWTH   Final   Report Status 07/05/2012 FINAL   Final  CLOSTRIDIUM DIFFICILE BY PCR     Status: None   Collection Time    07/03/12  3:54 PM      Result Value Range Status   C difficile by pcr NEGATIVE  NEGATIVE Final  CULTURE, BLOOD (ROUTINE X 2)     Status: None   Collection Time    07/03/12  4:20 PM      Result Value Range Status   Specimen Description BLOOD HAND LEFT   Final   Special Requests BOTTLES DRAWN AEROBIC  AND ANAEROBIC 10CC   Final  Culture  Setup Time 07/04/2012 02:22   Final   Culture NO GROWTH 5 DAYS   Final   Report Status 07/10/2012 FINAL   Final  URINE CULTURE     Status: None   Collection Time    07/04/12 12:17 PM      Result Value Range Status   Specimen Description URINE, CATHETERIZED   Final   Special Requests NONE   Final   Culture  Setup Time 07/05/2012 02:41   Final   Colony Count NO GROWTH   Final   Culture NO GROWTH   Final   Report Status 07/05/2012 FINAL   Final  CULTURE, RESPIRATORY (NON-EXPECTORATED)     Status: None   Collection Time    07/04/12 12:20 PM      Result Value Range Status   Specimen Description TRACHEAL ASPIRATE   Final   Special Requests NONE   Final   Gram Stain     Final   Value: RARE WBC PRESENT, PREDOMINANTLY MONONUCLEAR     RARE SQUAMOUS EPITHELIAL CELLS PRESENT     NO ORGANISMS SEEN   Culture NO GROWTH 2 DAYS   Final   Report Status 07/07/2012 FINAL   Final  CLOSTRIDIUM DIFFICILE BY PCR     Status: None   Collection Time    07/04/12  4:08 PM      Result Value Range Status   C difficile by pcr NEGATIVE  NEGATIVE Final  CULTURE, BLOOD (ROUTINE X 2)     Status: None   Collection Time    07/04/12  5:30 PM      Result Value Range Status   Specimen Description BLOOD HAND LEFT   Final   Special Requests BOTTLES DRAWN AEROBIC ONLY 5CC   Final   Culture  Setup Time 07/05/2012 03:29   Final   Culture NO GROWTH 5 DAYS   Final   Report Status 07/11/2012 FINAL   Final  CULTURE, BLOOD (ROUTINE X 2)     Status: None   Collection Time    07/04/12  8:14 PM      Result Value Range Status   Specimen Description BLOOD LEFT HAND   Final   Special Requests BOTTLES DRAWN AEROBIC ONLY 4CC   Final   Culture  Setup Time 07/05/2012 02:42   Final   Culture NO GROWTH 5 DAYS   Final   Report Status 07/11/2012 FINAL   Final     Studies:              All Imaging reviewed and is as per above notation   Scheduled Meds: . allopurinol  100 mg Oral Daily  .  aspirin  81 mg Oral Daily  . calcium acetate  667 mg Oral 5 X Daily  . carvedilol  6.25 mg Oral BID WC  . cefTAZidime (FORTAZ)  IV  2 g Intravenous Q M,W,F-2000  . cycloSPORINE modified  75 mg Oral BID   And  . cycloSPORINE modified  100 mg Oral BID  . famotidine  20 mg Oral Q1200  . feeding supplement  1 Container Oral BID BM  . insulin aspart  0-15 Units Subcutaneous Q4H  . insulin glargine  12 Units Subcutaneous BID  . levothyroxine  88 mcg Oral QAC breakfast  . metoCLOPramide  5 mg Oral TID AC & HS  . predniSONE  5 mg Oral Q breakfast  . rOPINIRole  1 mg Oral QHS  . sodium chloride  3 mL Intravenous Q12H  Continuous Infusions: . sodium chloride 10 mL/hr at 07/08/12 0416     Assessment/Plan: 1. Salmonella bacteremia with severe septic shock - patient improved with IV fluid support. He did not require pressors. Patient received empiric IV antibiotics including Flagyl and cefepime. On March 17 the patient was started on IV Fortaz. On 07/10/12 the lab called final speciation result on the GNR bacteremia as Salmonella bacteremia - ID Dr. Linus Salmons called and he recommended 1 more week of iv Fortaz with HD. Stop date 07/17/12  2. Respiratory failure - secondary to abdominal distention and pain - intubated and mechanically ventilated from March 12 until March 14. Extubated without difficulty. Currently off oxygen supplements.  3. H/o OSA-Last echo showed no Pulm Htn- ef 55-60% 4. Enterocolitis - due to salmonella. attempting to graduate diet slowly per GI.  5. ESRD s/p renal cadaveric transplant 1995 - resumed HD during current admission - patient to be discharged on dialysis, Tuesday Thursday Saturday. 6. DM type I - on insulin pump at home, which has been  removed. On lantus 12 units and moderate ssi in house. Blood sugars between 115-339.  Will consider up-titration to 15 units pm as eating now in the next 1-2 days 7. Severe abdominal pain and diarrheal illness-C. difficile negative x2,  ?secondary to gastroparesis. Added back metoclopramide Phenergan 3 times a day a.c. at bedtime to see if patient has bowel movements and resolution of pain-AXR 3.20 = Distension but patient passing flatus 8. Hypokalemia and hypocalcemia during the critical care time in the ICU-repleted  9. Patient on chronic immunosuppression for renal transplant including 5 mg of prednisone daily. This will need to be tapered as an out patient 10. Mild Hypotension-Mainly with dialysis-hold Coreg on non-dialysis days-give on M/W/F/Su   Code Status: Full Family Communication: None at bedside Disposition Plan: Patient set up for dialysis as an outpatient Tuesday Thursday Saturday [place]-P/Ot to eval and Rx and reassess for suitability of SNF   Verneita Griffes, MD  [facility] Pager [phone] 07/12/2012, 11:25 AM    LOS: 9 days

## 2012-07-12 NOTE — Progress Notes (Signed)
Patient seen, examined, and I agree with the above documentation, including the assessment and plan. Abd film show colonic dilation, likely ileus related.  Agree with suppository to stimulate from below. Supportive care, continue abx for salmonella enterocolitis

## 2012-07-12 NOTE — Evaluation (Signed)
Occupational Therapy Evaluation and Discharge Patient Details Name: Dale [name] MRN: [id number] DOB: Sep 11, 1965 Today's Date: 07/12/2012 Time: [id number] OT Time Calculation (min): 16 min  OT Assessment / Plan / Recommendation Clinical Impression  This 47 yo male admitted with VDRF, ESRD, and Sepsis as well as other hx of multiple medical problems  presents to acute OT today at a Mod I level. No further OT needs, will sign off.    OT Assessment  Patient does not need any further OT services    Follow Up Recommendations  No OT follow up       Equipment Recommendations  None recommended by OT          Precautions / Restrictions Precautions Precautions: Fall Precaution Comments: old LBKA with prothesis Restrictions Weight Bearing Restrictions: No       ADL  Eating/Feeding: Simulated;Independent Where Assessed - Eating/Feeding: Edge of bed Grooming: Simulated;Independent Where Assessed - Grooming: Unsupported sitting Upper Body Bathing: Simulated;Independent Where Assessed - Upper Body Bathing: Supported sitting Lower Body Bathing: Modified independent;Simulated Where Assessed - Lower Body Bathing: Unsupported sit to stand Upper Body Dressing: Simulated;Independent Where Assessed - Upper Body Dressing: Unsupported sitting Lower Body Dressing: Performed;Modified independent Where Assessed - Lower Body Dressing: Unsupported sit to stand Toilet Transfer: Simulated;Modified independent Toilet Transfer Method: Sit to Loss adjuster, chartered:  (Bed to door with RW, then back to bed without RD) Toileting - Clothing Manipulation and Hygiene: Simulated;Modified independent Where Assessed - Toileting Clothing Manipulation and Hygiene:  (sit to stand) Equipment Used: Rolling walker;Gait belt Transfers/Ambulation Related to ADLs: Mod I for all ADL Comments: Noted mild DOE with bed to door back to bed---pt reports that he doesn't have to walk more than this at home at any given  time and  in the kitchen he sits on a stool/chair instead of standing to do alot of activiites.        Visit Information  Last OT Received On: 07/12/12 Assistance Needed: +1    Subjective Data  Subjective: " waited up for 2 hours for you this morning and you did not come and now I am trying to take a nap and here you are" ( I apologized about coming in the afternoon, however I really had no idea that he wanted to be seen in the AM v. the PM)   Prior Functioning     Home Living Lives With: Spouse Available Help at Discharge: Family;Available PRN/intermittently Type of Home: House Home Access: Ramped entrance Home Layout: One level Bathroom Shower/Tub: Chiropodist: Handicapped height Bathroom Accessibility: Yes How Accessible: Accessible via wheelchair;Accessible via walker [place]: Shower chair with back;Walker - rolling;Walker - four wheeled;Wheelchair - manual Prior Function Level of Independence: Independent Able to Take Stairs?: Yes Driving: Yes Vocation: On disability Communication Communication: No difficulties Dominant Hand: Right            Cognition  Cognition Overall Cognitive Status: Appears within functional limits for tasks assessed/performed Arousal/Alertness: Awake/alert Orientation Level: Appears intact for tasks assessed Behavior During Session: [facility] for tasks performed    Extremity/Trunk Assessment Right Upper Extremity Assessment RUE ROM/Strength/Tone: Within functional levels Left Upper Extremity Assessment LUE ROM/Strength/Tone: Within functional levels     Mobility Bed Mobility Bed Mobility: Supine to Sit;Sitting - Scoot to Edge of Bed Supine to Sit: 6: Modified independent (Device/Increase time);HOB elevated;With rails (20 degrees) Sitting - Scoot to Edge of Bed: 6: Modified independent (Device/Increase time) Transfers Transfers: Sit to Stand;Stand to Sit Sit to Stand:  6: Modified independent  (Device/Increase time);With upper extremity assist;From bed Stand to Sit: 6: Modified independent (Device/Increase time);With upper extremity assist;To bed           End of Session OT - End of Session Equipment Utilized During Treatment: Gait belt Activity Tolerance:  (mild DOE for short distance) Patient left: in bed (sitting EOB)       Almon Register [id number] 07/12/2012, 3:17 PM

## 2012-07-12 NOTE — Progress Notes (Signed)
[facility] ROUNDING NOTE   Subjective:   Interval History: abdominal pain better  Objective:  Vital signs in last 24 hours:  Temp:  [97.5 F (36.4 C)-98.6 F (37 C)] 98.6 F (37 C) (03/20 0911) Pulse Rate:  [78-85] 80 (03/20 0911) Resp:  [16-20] 18 (03/20 0911) BP: (87-162)/(46-91) 137/75 mmHg (03/20 0911) SpO2:  [96 %-98 %] 97 % (03/20 0911) Weight:  [117.7 kg (259 lb 7.7 oz)-119.4 kg (263 lb 3.7 oz)] 117.7 kg (259 lb 7.7 oz) (03/19 2048)  Weight change: 1.7 kg (3 lb 12 oz) Filed Weights   07/11/12 1146 07/11/12 1507 07/11/12 2048  Weight: 119.4 kg (263 lb 3.7 oz) 117.7 kg (259 lb 7.7 oz) 117.7 kg (259 lb 7.7 oz)    Intake/Output: I/O last 3 completed shifts: In: 1080 [P.O.:1080] Out: 1623 [Other:1623]   Intake/Output this shift:     [id number] resting in bed- NGT in-situ  [id number] RRR, normal S1 and S2  Resp:Decreased BS over bases- otherwise CTA  [id number], obese, NT, BS normal  [id number]   Basic Metabolic Panel:  Recent Labs Lab 07/06/12 0830 07/06/12 1026 07/07/12 0410 07/07/12 0411 07/08/12 1050 07/09/12 0500 07/11/12 1247  NA 137  --   --  139 134* 135 135  K 4.3  --   --  3.5 3.6 3.3* 3.3*  CL 98  --   --  101 99 101 98  CO2 21  --   --  22 21 21 25   GLUCOSE 268*  --   --  186* 282* 212* 165*  BUN 55*  --   --  71* 59* 69* 49*  CREATININE 5.63*  --   --  6.27* 4.45* 4.64* 4.52*  CALCIUM 7.6*  --   --  7.9* 8.1* 8.5 8.7  MG  --  2.3 2.3  --   --   --   --   PHOS 5.5*  --   --  5.6*  --  4.5 4.7*    Liver Function Tests:  Recent Labs Lab 07/06/12 0830 07/07/12 0411 07/09/12 0500 07/10/12 1003 07/11/12 1247  AST  --   --   --  32  --   ALT  --   --   --  21  --   ALKPHOS  --   --   --  73  --   BILITOT  --   --   --  0.6  --   PROT  --   --   --  6.4  --   ALBUMIN 2.0* 2.0* 2.2* 2.2* 2.1*    Recent Labs Lab 07/10/12 1003  LIPASE 46   No results found for this basename: AMMONIA,  in the last 168  hours  CBC:  Recent Labs Lab 07/07/12 1147 07/07/12 1725 07/08/12 07/09/12 0500 07/11/12 1248  WBC 9.4 8.5 8.0 5.7 4.8  HGB 9.4* 9.3* 8.6* 9.3* 10.8*  HCT 28.3* 28.4* 25.0* 27.3* 32.6*  MCV 94.0 95.0 93.6 91.9 93.1  PLT 126* 114* 131* 124* 157    Cardiac Enzymes: No results found for this basename: CKTOTAL, CKMB, CKMBINDEX, TROPONINI,  in the last 168 hours  BNP: No components found with this basename: POCBNP,   CBG:  Recent Labs Lab 07/11/12 1649 07/11/12 2052 07/12/12 0011 07/12/12 0420 07/12/12 0729  GLUCAP 201* 339* 265* 158* 115*    Microbiology: Results for orders placed during the hospital encounter of 07/03/12  CULTURE, BLOOD (ROUTINE X 2)     Status: None  Collection Time    07/03/12  1:50 PM      Result Value Range Status   Specimen Description BLOOD LEFT FOREARM   Final   Special Requests BOTTLES DRAWN AEROBIC ONLY 10CC   Final   Culture  Setup Time 07/04/2012 04:59   Final   Culture     Final   Value: SALMONELLA SPECIES     Note: CRITICAL RESULT CALLED TO, READ BACK BY AND VERIFIED WITH: NADINE WELLINGTON@1042  ON [id number] BY [facility] FAXED TO [facility] 07/10/12 BY CATAR Referred to [facility] in [place], [place] for      Serotyping.     Note: Gram Stain Report Called to,Read Back By and Verified With: RACHEL FOUNTAIN 07/08/12 @ 12:33PM BY RUSCA.   Report Status PENDING   Incomplete   Organism ID, Bacteria SALMONELLA SPECIES   Final  URINE CULTURE     Status: None   Collection Time    07/03/12  2:51 PM      Result Value Range Status   Specimen Description URINE, CLEAN CATCH   Final   Special Requests Immunocompromised   Final   Culture  Setup Time 07/03/2012 15:45   Final   Colony Count NO GROWTH   Final   Culture NO GROWTH   Final   Report Status 07/05/2012 FINAL   Final  CLOSTRIDIUM DIFFICILE BY PCR     Status: None   Collection Time    07/03/12  3:54 PM      Result Value Range Status   C  difficile by pcr NEGATIVE  NEGATIVE Final  CULTURE, BLOOD (ROUTINE X 2)     Status: None   Collection Time    07/03/12  4:20 PM      Result Value Range Status   Specimen Description BLOOD HAND LEFT   Final   Special Requests BOTTLES DRAWN AEROBIC AND ANAEROBIC 10CC   Final   Culture  Setup Time 07/04/2012 02:22   Final   Culture NO GROWTH 5 DAYS   Final   Report Status 07/10/2012 FINAL   Final  URINE CULTURE     Status: None   Collection Time    07/04/12 12:17 PM      Result Value Range Status   Specimen Description URINE, CATHETERIZED   Final   Special Requests NONE   Final   Culture  Setup Time 07/05/2012 02:41   Final   Colony Count NO GROWTH   Final   Culture NO GROWTH   Final   Report Status 07/05/2012 FINAL   Final  CULTURE, RESPIRATORY (NON-EXPECTORATED)     Status: None   Collection Time    07/04/12 12:20 PM      Result Value Range Status   Specimen Description TRACHEAL ASPIRATE   Final   Special Requests NONE   Final   Gram Stain     Final   Value: RARE WBC PRESENT, PREDOMINANTLY MONONUCLEAR     RARE SQUAMOUS EPITHELIAL CELLS PRESENT     NO ORGANISMS SEEN   Culture NO GROWTH 2 DAYS   Final   Report Status 07/07/2012 FINAL   Final  CLOSTRIDIUM DIFFICILE BY PCR     Status: None   Collection Time    07/04/12  4:08 PM      Result Value Range Status   C difficile by pcr NEGATIVE  NEGATIVE Final  CULTURE, BLOOD (ROUTINE X 2)     Status: None   Collection  Time    07/04/12  5:30 PM      Result Value Range Status   Specimen Description BLOOD HAND LEFT   Final   Special Requests BOTTLES DRAWN AEROBIC ONLY 5CC   Final   Culture  Setup Time 07/05/2012 03:29   Final   Culture NO GROWTH 5 DAYS   Final   Report Status 07/11/2012 FINAL   Final  CULTURE, BLOOD (ROUTINE X 2)     Status: None   Collection Time    07/04/12  8:14 PM      Result Value Range Status   Specimen Description BLOOD LEFT HAND   Final   Special Requests BOTTLES DRAWN AEROBIC ONLY 4CC   Final   Culture   Setup Time 07/05/2012 02:42   Final   Culture NO GROWTH 5 DAYS   Final   Report Status 07/11/2012 FINAL   Final    Coagulation Studies: No results found for this basename: LABPROT, INR,  in the last 72 hours  Urinalysis: No results found for this basename: COLORURINE, APPERANCEUR, LABSPEC, PHURINE, GLUCOSEU, HGBUR, BILIRUBINUR, KETONESUR, PROTEINUR, UROBILINOGEN, NITRITE, LEUKOCYTESUR,  in the last 72 hours    Imaging: Dg Abd 2 Views  07/12/2012  *RADIOLOGY REPORT*  Clinical Data: Enterocolitis.  Rule out ileus.  Mid abdominal pain.  ABDOMEN - 2 VIEW  Comparison: 07/05/2012.  Findings: Nasogastric tube has been removed.  Gas filled prominent size colon (transverse colon measuring up to 8.9 cm).  No obvious free intraperitoneal air on decubitus view.  CT detected abnormal small bowel loops not adequately assessed by plain film exam.  Vascular calcifications.  IMPRESSION: Abnormal although nonspecific bowel gas pattern as noted above.   Original Report Authenticated By: Genia Del, M.D.      Medications:   . sodium chloride 10 mL/hr at 07/08/12 0416   . allopurinol  100 mg Oral Daily  . aspirin  81 mg Oral Daily  . calcium acetate  667 mg Oral 5 X Daily  . carvedilol  6.25 mg Oral BID WC  . cefTAZidime (FORTAZ)  IV  2 g Intravenous Q M,W,F-2000  . cycloSPORINE modified  75 mg Oral BID   And  . cycloSPORINE modified  100 mg Oral BID  . famotidine  20 mg Oral Q1200  . feeding supplement  1 Container Oral BID BM  . insulin aspart  0-15 Units Subcutaneous Q4H  . insulin glargine  12 Units Subcutaneous BID  . levothyroxine  88 mcg Oral QAC breakfast  . metoCLOPramide  5 mg Oral TID AC & HS  . predniSONE  5 mg Oral Q breakfast  . rOPINIRole  1 mg Oral QHS  . sodium chloride  3 mL Intravenous Q12H   sodium chloride, sodium chloride, sodium chloride, acetaminophen, acetaminophen, feeding supplement (NEPRO CARB STEADY), heparin, heparin, hydrALAZINE, HYDROmorphone (DILAUDID) injection,  HYDROmorphone, labetalol, lidocaine (PF), lidocaine-prilocaine, nitroGLYCERIN, ondansetron (ZOFRAN) IV, pentafluoroprop-tetrafluoroeth, promethazine, sodium chloride, sorbitol  Assessment/ Plan:  1. Acute renal failure chronic kidney disease stage IV/V: Suspect evolution to dialysis dependent ESRD wants to dialyze at [facility]  2. Anemia: Suspect underlying anemia of chronic kidney disease now compounded by acute illness, initiated ESA therapy  3. CKD-MBD: will start binder when consistently taking PO intake (off NGT)  Patient accepted at [facility]    LOS: 9 Tycen Dockter W @TODAY @11 :10 AM

## 2012-07-12 NOTE — Progress Notes (Signed)
MD returned page, requesting to wait a full hour from admin of novolog and recheck. Will continue to monitor.

## 2012-07-12 NOTE — Progress Notes (Signed)
Pt fefuses use of CPAP.

## 2012-07-12 NOTE — Clinical Social Work Psychosocial (Signed)
Clinical Social Work Department BRIEF PSYCHOSOCIAL ASSESSMENT 07/12/2012  Patient:  Dale [name], Dale [name]     Account Number:  [id number]     Admit date:  07/03/2012  Clinical Social Worker:  Frederico Hamman  Date/Time:  07/12/2012 01:44 PM  Referred by:  RN  Date Referred:  07/11/2012 Referred for  SNF Placement   Other Referral:   Interview type:  Patient Other interview type:    PSYCHOSOCIAL DATA Living Status:  WIFE Admitted from facility:   Level of care:   Primary support name:  [name] Primary support relationship to patient:  SPOUSE Degree of support available:   Patient lives with spouse and there is adequate support    CURRENT CONCERNS Current Concerns  Post-Acute Placement   Other Concerns:   Patient was concerned with transportation to and from dialysis.    SOCIAL WORK ASSESSMENT / PLAN CSW intern was advised by PT to speak with the patient about SNF placement. CSW intern introduced self and acknowledged the patient. CSW intern discussed discharge plans with the patient who engaged well in conversation. Patient informed CSW intern that his plans are to return home. CSW intern spoke with him about the importance of client safety upon discharge and ensured whether or not the patient would have assistance at home. Patient expressed to [place] intern that he is able to ambulate on his own and that his only concern is getting transportation to dialysis. CSW intern informed he patient that we will follow up with information regarding transportation. Patient also advised [place] to speak with his wife about SNF placement when she arrives.    CSW intern followed up with patient regarding transportation resources. Transportation services in [place] are not able to assist patient with transport to dialysis in [place]. Patient expressed that he will try to workout transportation to and from dialysis. CSW signing off concerning transportation needs. Physical Therapy  evaluated patient and is recommending [facility] however, patient is ambulating independently in his room and does not need short term rehab services at this time.   Assessment/plan status:  No Further Intervention Required Other assessment/ plan:   Information/referral to community resources:   CSW contacted [facility] and was advised that they could not assist the patient with transportation.    PATIENT'S/FAMILY'S RESPONSE TO PLAN OF CARE: Patient was appreciative of the services provided by [place] intern.    Lucius Conn, Social Work Intern 07/12/2012  Co-signed: Demarea Lorey Givens, LCSW 07/12/12

## 2012-07-12 NOTE — Clinical Social Work Psychosocial (Signed)
Clinical Social Work Department BRIEF PSYCHOSOCIAL ASSESSMENT 07/12/2012  Patient:  Dale [name], Dale [name]     Account Number:  [id number]     Admit date:  07/03/2012  Clinical Social Worker:  Frederico Hamman  Date/Time:  07/12/2012 01:44 PM  Referred by:  RN  Date Referred:  07/11/2012 Referred for  SNF Placement   Other Referral:   Interview type:  Patient Other interview type:    PSYCHOSOCIAL DATA Living Status:  WIFE Admitted from facility:   Level of care:   Primary support name:  Dale [name] Primary support relationship to patient:  SPOUSE Degree of support available:   Patient lives with spouse and there is adequate support    CURRENT CONCERNS Current Concerns  Post-Acute Placement   Other Concerns:   Patient was concerned with transportation to and from dialysis.    SOCIAL WORK ASSESSMENT / PLAN CSW intern was advised by PT to speak with the patient about SNF placement. CSW intern introduced self and acknowledged the patient. CSW intern discussed discharge plans with the patient who engaged well in conversation. Patient informed CSW intern that his plans are to return home. CSW intern spoke with him about the importance of client safety upon discharge and ensured whether or not the patient would have assistance at home. Patient expressed to [place] intern that he is able to ambulate on his own and that his only concern is getting transportation to dialysis. CSW intern informed he patient that we will follow up with information regarding transportation. Patient also advised [place] to speak with his wife about SNF placement when she arrives.    CSW intern followed up with patient regarding transportation resources. Transportation services in [place] are not able to assist patient with transport to dialysis in [place]. Patient expressed that he will try to workout transportation to and from dialysis. CSW signing off concerning transportation needs. Physical Therapy  evaluated patient and is recommending [facility] however, patient is ambulating independently in his room and does not need short term rehab services at this time.   Assessment/plan status:  No Further Intervention Required Other assessment/ plan:   Information/referral to community resources:   CSW contacted [facility] and was advised that they could not assist the patient with transportation.    PATIENTS/FAMILYS RESPONSE TO PLAN OF CARE: Patient was appreciative of the services provided by [place] intern.     Lucius Conn, Social Work Intern 07/12/2012  Co-signed: Crawford Givens, LCSW

## 2012-07-12 NOTE — Progress Notes (Signed)
ANTIBIOTIC CONSULT NOTE - FOLLOW UP  Pharmacy Consult for Ceftazidime Indication: Salmonella Bacteremia   Allergies  Allergen Reactions  . Morphine And Related Hives and Nausea Only  . Amoxicillin Other (See Comments)    Reaction unknown  . Ciprofloxacin Other (See Comments)    Reaction unknown  . Clindamycin/Lincomycin Other (See Comments)    Reaction unknown  . Codeine Other (See Comments)    Reaction unknown. But pt has tolerated hydrocodone in Vicodin and Norco in the past  . Levofloxacin Other (See Comments)    Reaction unknown  . Vasotec Cough  . Oxycodone Rash    Patient Measurements: Height: 5\' 8"  (172.7 cm) Weight: 259 lb 7.7 oz (117.7 kg) IBW/kg (Calculated) : 68.4  Vital Signs: Temp: 98.6 F (37 C) (03/20 0911) Temp src: Oral (03/20 1300) BP: 153/73 mmHg (03/20 1300) Pulse Rate: 85 (03/20 1300) Intake/Output from previous day: 03/19 0701 - 03/20 0700 In: 1080 [P.O.:1080] Out: 1623  Intake/Output from this shift: Total I/O In: 240 [P.O.:240] Out: -   Labs:  Recent Labs  07/11/12 1247 07/11/12 1248  WBC  --  4.8  HGB  --  10.8*  PLT  --  157  CREATININE 4.52*  --    Estimated Creatinine Clearance: 25.4 ml/min (by C-G formula based on Cr of 4.52). No results found for this basename: VANCOTROUGH, Corlis Leak, VANCORANDOM, Gonzales, GENTPEAK, GENTRANDOM, TOBRATROUGH, TOBRAPEAK, TOBRARND, AMIKACINPEAK, AMIKACINTROU, AMIKACIN,  in the last 72 hours   Microbiology: Recent Results (from the past 720 hour(s))  MRSA PCR SCREENING     Status: None   Collection Time    06/18/12  3:18 AM      Result Value Range Status   MRSA by PCR NEGATIVE  NEGATIVE Final   Comment:            The GeneXpert MRSA Assay (FDA     approved for NASAL specimens     only), is one component of a     comprehensive MRSA colonization     surveillance program. It is not     intended to diagnose MRSA     infection nor to guide or     monitor treatment for     MRSA infections.   CULTURE, BLOOD (ROUTINE X 2)     Status: None   Collection Time    07/03/12  1:50 PM      Result Value Range Status   Specimen Description BLOOD LEFT FOREARM   Final   Special Requests BOTTLES DRAWN AEROBIC ONLY 10CC   Final   Culture  Setup Time 07/04/2012 04:59   Final   Culture     Final   Value: SALMONELLA SPECIES     Note: CRITICAL RESULT CALLED TO, READ BACK BY AND VERIFIED WITH: NADINE WELLINGTON@1042  ON [id number] BY [facility] FAXED TO [facility] 07/10/12 BY CATAR Referred to [facility] in [place], [place] for      Serotyping.     Note: Gram Stain Report Called to,Read Back By and Verified With: RACHEL FOUNTAIN 07/08/12 @ 12:33PM BY [facility]USCA.   Report Status PENDING   Incomplete   Organism ID, Bacteria SALMONELLA SPECIES   Final  URINE CULTURE     Status: None   Collection Time    07/03/12  2:51 PM      Result Value Range Status   Specimen Description URINE, CLEAN CATCH   Final   Special Requests Immunocompromised   Final   Culture  Setup Time 07/03/2012 15:45   Final   Colony Count NO GROWTH   Final   Culture NO GROWTH   Final   Report Status 07/05/2012 FINAL   Final  CLOSTRIDIUM DIFFICILE BY PCR     Status: None   Collection Time    07/03/12  3:54 PM      Result Value Range Status   C difficile by pcr NEGATIVE  NEGATIVE Final  CULTURE, BLOOD (ROUTINE X 2)     Status: None   Collection Time    07/03/12  4:20 PM      Result Value Range Status   Specimen Description BLOOD HAND LEFT   Final   Special Requests BOTTLES DRAWN AEROBIC AND ANAEROBIC 10CC   Final   Culture  Setup Time 07/04/2012 02:22   Final   Culture NO GROWTH 5 DAYS   Final   Report Status 07/10/2012 FINAL   Final  URINE CULTURE     Status: None   Collection Time    07/04/12 12:17 PM      Result Value Range Status   Specimen Description URINE, CATHETERIZED   Final   Special Requests NONE   Final   Culture  Setup Time 07/05/2012 02:41   Final   Colony Count NO  GROWTH   Final   Culture NO GROWTH   Final   Report Status 07/05/2012 FINAL   Final  CULTURE, RESPIRATORY (NON-EXPECTORATED)     Status: None   Collection Time    07/04/12 12:20 PM      Result Value Range Status   Specimen Description TRACHEAL ASPIRATE   Final   Special Requests NONE   Final   Gram Stain     Final   Value: RARE WBC PRESENT, PREDOMINANTLY MONONUCLEAR     RARE SQUAMOUS EPITHELIAL CELLS PRESENT     NO ORGANISMS SEEN   Culture NO GROWTH 2 DAYS   Final   Report Status 07/07/2012 FINAL   Final  CLOSTRIDIUM DIFFICILE BY PCR     Status: None   Collection Time    07/04/12  4:08 PM      Result Value Range Status   C difficile by pcr NEGATIVE  NEGATIVE Final  CULTURE, BLOOD (ROUTINE X 2)     Status: None   Collection Time    07/04/12  5:30 PM      Result Value Range Status   Specimen Description BLOOD HAND LEFT   Final   Special Requests BOTTLES DRAWN AEROBIC ONLY 5CC   Final   Culture  Setup Time 07/05/2012 03:29   Final   Culture NO GROWTH 5 DAYS   Final   Report Status 07/11/2012 FINAL   Final  CULTURE, BLOOD (ROUTINE X 2)     Status: None   Collection Time    07/04/12  8:14 PM      Result Value Range Status   Specimen Description BLOOD LEFT HAND   Final   Special Requests BOTTLES DRAWN AEROBIC ONLY 4CC   Final   Culture  Setup Time 07/05/2012 02:42   Final   Culture NO GROWTH 5 DAYS   Final   Report Status 07/11/2012 FINAL   Final    Anti-infectives   Start     Dose/Rate Route Frequency Ordered Stop   07/11/12 2000  cefTAZidime (FORTAZ) 2 g in dextrose 5 % 50 mL IVPB     2 g 100 mL/hr over 30 Minutes Intravenous Every M-W-F (2000) 07/09/12 0730 07/18/12 1959  07/09/12 0730  cefTAZidime (FORTAZ) 2 g in dextrose 5 % 50 mL IVPB     2 g 100 mL/hr over 30 Minutes Intravenous  Once 07/09/12 0729 07/09/12 1032   07/05/12 2100  vancomycin (VANCOCIN) 750 mg in sodium chloride 0.9 % 150 mL IVPB     750 mg 150 mL/hr over 60 Minutes Intravenous  Once 07/05/12 2029  07/05/12 2200   07/05/12 1800  vancomycin (VANCOCIN) 50 mg/mL oral solution 500 mg  Status:  Discontinued     500 mg Per Tube 4 times per day 07/05/12 1511 07/07/12 1617   07/05/12 1600  vancomycin (VANCOCIN) 1,750 mg in sodium chloride 0.9 % 500 mL IVPB  Status:  Discontinued     1,750 mg 250 mL/hr over 120 Minutes Intravenous Every 48 hours 07/03/12 1541 07/03/12 1918   07/04/12 1700  vancomycin (VANCOCIN) IVPB 1000 mg/200 mL premix     1,000 mg 200 mL/hr over 60 Minutes Intravenous  Once 07/04/12 1648 07/04/12 1851   07/04/12 1600  ceFEPIme (MAXIPIME) 1 g in dextrose 5 % 50 mL IVPB  Status:  Discontinued     1 g 100 mL/hr over 30 Minutes Intravenous Every 24 hours 07/04/12 0858 07/07/12 1617   07/04/12 0900  metroNIDAZOLE (FLAGYL) IVPB 500 mg  Status:  Discontinued     500 mg 100 mL/hr over 60 Minutes Intravenous Every 8 hours 07/04/12 0849 07/07/12 1617   07/04/12 0000  vancomycin (VANCOCIN) 50 mg/mL oral solution 250 mg  Status:  Discontinued     250 mg Oral 4 times per day 07/03/12 1918 07/04/12 0849   07/03/12 2200  metroNIDAZOLE (FLAGYL) tablet 500 mg  Status:  Discontinued     500 mg Oral 3 times per day 07/03/12 1918 07/04/12 0849   07/03/12 1800  vancomycin (VANCOCIN) 50 mg/mL oral solution 125 mg  Status:  Discontinued     125 mg Oral 4 times per day 07/03/12 1537 07/03/12 1918   07/03/12 1700  oseltamivir (TAMIFLU) capsule 75 mg  Status:  Discontinued     75 mg Oral Daily 07/03/12 1540 07/07/12 1617   07/03/12 1530  vancomycin (VANCOCIN) 2,000 mg in sodium chloride 0.9 % 500 mL IVPB     2,000 mg 250 mL/hr over 120 Minutes Intravenous  Once 07/03/12 1526 07/03/12 1913   07/03/12 1530  ceFEPIme (MAXIPIME) 1 g in dextrose 5 % 50 mL IVPB  Status:  Discontinued     1 g 100 mL/hr over 30 Minutes Intravenous Every 24 hours 07/03/12 1526 07/03/12 1918     Assessment: 47 year old male with salmonella bacteremia to be continued on ceftazidime per Pharmacy. ID recommends treatment  until 07/17/12. Patient is currently afebrile, WBC wnl.   Vanc IV 3/11 >>> 3/15  Vanc PO 3/13 >>> 3/15  Flagyl 3/11 >>> 3/15  Cefepime 3/11 >>> 3/15  Tamiflu 3/10 >>> 3/15  Fortaz 3/17 >>>>>[07/17/12] Cx 3/11 Cdiff>>neg 3/11 BCx 1/2>>Salmonella (pansens) 3/12 BCx 2/2>>neg 3/12 RespCx>> neg 3/12 Ucx Cx>>neg  Goal of Therapy:  Eradication of disease   Plan:  Continue ceftazidime 2g IV after each HD session  Thank you, Vertis Kelch, Pharm.D. Clinical Pharmacist   Pager: [phone] 07/12/2012 4:38 PM

## 2012-07-12 NOTE — Progress Notes (Signed)
Physical Therapy Treatment Patient Details Name: Dale [name] MRN: [id number] DOB: Mar 19, 1966 Today's Date: 07/12/2012 Time: [id number] PT Time Calculation (min): 21 min  PT Assessment / Plan / Recommendation Comments on Treatment Session  Pt is much improved today! He is independent in prosthesis management, bed mobility, transfers and short distance gait.  He does have dyspnea on exertion and fatigue with activity.  Will update d/c plan to home with HHPT    Follow Up Recommendations  Home health PT     Does the patient have the potential to tolerate intense rehabilitation     Barriers to Discharge        Equipment Recommendations  None recommended by PT    Recommendations for Other Services    Frequency Min 3X/week   Plan Discharge plan needs to be updated;Frequency remains appropriate    Precautions / Restrictions Precautions Precautions: Fall Precaution Comments: old LBKA with prothesis Restrictions Weight Bearing Restrictions: No   Pertinent Vitals/Pain Dyspnea on exertion, fatigue with activity    Mobility  Bed Mobility Bed Mobility: Supine to Sit Supine to Sit: 6: Modified independent (Device/Increase time) Sitting - Scoot to Edge of Bed: 6: Modified independent (Device/Increase time) Details for Bed Mobility Assistance: pt feels much better today and is able to move himself independently Transfers Transfers: Sit to Stand;Stand to Sit Sit to Stand: 6: Modified independent (Device/Increase time);With upper extremity assist;From bed Stand to Sit: 6: Modified independent (Device/Increase time);With upper extremity assist;To bed Details for Transfer Assistance: pt had prosthesis on already, but did demonstrate that he is independent in don and doffing prosthesis Ambulation/Gait Ambulation/Gait Assistance: 6: Modified independent (Device/Increase time) Ambulation Distance (Feet): 25 Feet Assistive device: Rolling walker Ambulation/Gait Assistance Details: pt dyspneic on  exertion and appears fatigued after ambulation, but is able to do OK Gait Pattern: Step-to pattern;Wide base of support Gait velocity: WFL General Gait Details: Pt appears to be comfortable using RW Stairs: No Wheelchair Mobility Wheelchair Mobility: No    Exercises Other Exercises Other Exercises: repeated sit to stand x 5 reps   PT Diagnosis:    PT Problem List:   PT Treatment Interventions:     PT Goals Acute Rehab PT Goals PT Goal: Supine/Side to Sit - Progress: Met PT Goal: Sit to Stand - Progress: Met PT Goal: Stand to Sit - Progress: Met PT Goal: Ambulate - Progress: Progressing toward goal  Visit Information  Last PT Received On: 07/12/12 Assistance Needed: +1    Subjective Data  Subjective: I wish they would send me home so I can recuperate at home Patient Stated Goal: to go home   Cognition  Cognition Overall Cognitive Status: Appears within functional limits for tasks assessed/performed Arousal/Alertness: Awake/alert Orientation Level: Appears intact for tasks assessed Behavior During Session: [facility] for tasks performed    Balance  Balance Balance Assessed: Yes Static Sitting Balance Static Sitting - Balance Support: Feet supported;No upper extremity supported Static Sitting - Level of Assistance: 7: Independent Static Standing Balance Static Standing - Balance Support: Bilateral upper extremity supported Static Standing - Level of Assistance: 6: Modified independent (Device/Increase time) Static Standing - Comment/# of Minutes: prosthesis intact, wide RW  End of Session PT - End of Session Equipment Utilized During Treatment: Left lower extremity prosthesis Patient left: in chair   GP    Tonga Prout K. [place], [place] 07/12/2012, 4:33 PM

## 2012-07-12 NOTE — Progress Notes (Signed)
MD order to give 15 units of novolog, recheck in 30. CBG recheck 562, MD notified. Will continue to monitor.

## 2012-07-13 ENCOUNTER — Encounter (HOSPITAL_COMMUNITY): Payer: Medicare Other

## 2012-07-13 LAB — GLUCOSE, CAPILLARY
Glucose-Capillary: 142 mg/dL — ABNORMAL HIGH (ref 70–99)
Glucose-Capillary: 213 mg/dL — ABNORMAL HIGH (ref 70–99)
Glucose-Capillary: 259 mg/dL — ABNORMAL HIGH (ref 70–99)
Glucose-Capillary: 361 mg/dL — ABNORMAL HIGH (ref 70–99)
Glucose-Capillary: 40 mg/dL — CL (ref 70–99)
Glucose-Capillary: 400 mg/dL — ABNORMAL HIGH (ref 70–99)
Glucose-Capillary: 58 mg/dL — ABNORMAL LOW (ref 70–99)
Glucose-Capillary: 67 mg/dL — ABNORMAL LOW (ref 70–99)
Glucose-Capillary: 74 mg/dL (ref 70–99)

## 2012-07-13 LAB — CBC
HCT: 27.5 % — ABNORMAL LOW (ref 39.0–52.0)
Hemoglobin: 9.4 g/dL — ABNORMAL LOW (ref 13.0–17.0)
MCH: 31.4 pg (ref 26.0–34.0)
MCHC: 34.2 g/dL (ref 30.0–36.0)
MCV: 92 fL (ref 78.0–100.0)
Platelets: 180 10*3/uL (ref 150–400)
RBC: 2.99 MIL/uL — ABNORMAL LOW (ref 4.22–5.81)
RDW: 15.6 % — ABNORMAL HIGH (ref 11.5–15.5)
WBC: 5.6 10*3/uL (ref 4.0–10.5)

## 2012-07-13 LAB — RENAL FUNCTION PANEL
Albumin: 2.2 g/dL — ABNORMAL LOW (ref 3.5–5.2)
BUN: 36 mg/dL — ABNORMAL HIGH (ref 6–23)
CO2: 25 mEq/L (ref 19–32)
Calcium: 8.8 mg/dL (ref 8.4–10.5)
Chloride: 97 mEq/L (ref 96–112)
Creatinine, Ser: 4.45 mg/dL — ABNORMAL HIGH (ref 0.50–1.35)
GFR calc Af Amer: 17 mL/min — ABNORMAL LOW (ref >90)
GFR calc non Af Amer: 15 mL/min — ABNORMAL LOW (ref >90)
Glucose, Bld: 279 mg/dL — ABNORMAL HIGH (ref 70–99)
Phosphorus: 3.9 mg/dL (ref 2.3–4.6)
Potassium: 3.2 mEq/L — ABNORMAL LOW (ref 3.5–5.1)
Sodium: 136 mEq/L (ref 135–145)

## 2012-07-13 MED ORDER — CALCIUM ACETATE 667 MG PO CAPS
667.0000 mg | ORAL_CAPSULE | Freq: Three times a day (TID) | ORAL | Status: DC
  Administered 2012-07-13 – 2012-07-14 (×4): 667 mg via ORAL
  Filled 2012-07-13 (×6): qty 1

## 2012-07-13 MED ORDER — HEPARIN SODIUM (PORCINE) 1000 UNIT/ML DIALYSIS: 20.0000 [IU]/kg | INTRAMUSCULAR | Status: DC | PRN

## 2012-07-13 MED ORDER — DARBEPOETIN ALFA-POLYSORBATE 150 MCG/0.3ML IJ SOLN
150.0000 ug | INTRAMUSCULAR | Status: DC
  Administered 2012-07-13: 150 ug via INTRAVENOUS

## 2012-07-13 MED ORDER — HEPARIN SODIUM (PORCINE) 1000 UNIT/ML DIALYSIS: 1000.0000 [IU] | INTRAMUSCULAR | Status: DC | PRN

## 2012-07-13 MED ORDER — ALTEPLASE 2 MG IJ SOLR: 2.0000 mg | Freq: Once | INTRAMUSCULAR | Status: DC | PRN

## 2012-07-13 MED ORDER — GLUCOSE 40 % PO GEL
ORAL | Status: AC
  Administered 2012-07-13: 37.5 g
  Filled 2012-07-13: qty 1

## 2012-07-13 MED ORDER — LIDOCAINE-PRILOCAINE 2.5-2.5 % EX CREA: 1.0000 "application " | TOPICAL_CREAM | CUTANEOUS | Status: DC | PRN

## 2012-07-13 MED ORDER — NEPRO/CARBSTEADY PO LIQD: 237.0000 mL | ORAL | Status: DC | PRN

## 2012-07-13 MED ORDER — SODIUM CHLORIDE 0.9 % IV SOLN: 100.0000 mL | INTRAVENOUS | Status: DC | PRN

## 2012-07-13 MED ORDER — HYDROMORPHONE HCL PF 1 MG/ML IJ SOLN
INTRAMUSCULAR | Status: AC
  Filled 2012-07-13: qty 1

## 2012-07-13 MED ORDER — HYDROMORPHONE HCL PF 1 MG/ML IJ SOLN
INTRAMUSCULAR | Status: AC
  Administered 2012-07-13: 1 mg via INTRAVENOUS
  Filled 2012-07-13: qty 1

## 2012-07-13 MED ORDER — LIDOCAINE HCL (PF) 1 % IJ SOLN: 5.0000 mL | INTRAMUSCULAR | Status: DC | PRN

## 2012-07-13 MED ORDER — DARBEPOETIN ALFA-POLYSORBATE 150 MCG/0.3ML IJ SOLN
INTRAMUSCULAR | Status: AC
  Administered 2012-07-13: 150 ug via INTRAVENOUS
  Filled 2012-07-13: qty 0.3

## 2012-07-13 MED ORDER — PENTAFLUOROPROP-TETRAFLUOROETH EX AERO: 1.0000 "application " | INHALATION_SPRAY | CUTANEOUS | Status: DC | PRN

## 2012-07-13 MED ORDER — INSULIN GLARGINE 100 UNIT/ML ~~LOC~~ SOLN
12.0000 [IU] | Freq: Every day | SUBCUTANEOUS | Status: DC
  Administered 2012-07-13 – 2012-07-14 (×2): 12 [IU] via SUBCUTANEOUS
  Filled 2012-07-13 (×2): qty 0.12

## 2012-07-13 NOTE — Progress Notes (Signed)
Physical Therapy Treatment Patient Details Name: Dale [name] MRN: [id number] DOB: [birth year] Today's Date: 07/13/2012 Time: [id number] PT Time Calculation (min): 19 min  PT Assessment / Plan / Recommendation Comments on Treatment Session  Pt's activity tolerance continues to improve.      Follow Up Recommendations  Home health PT     Does the patient have the potential to tolerate intense rehabilitation     Barriers to Discharge        Equipment Recommendations  None recommended by PT    Recommendations for Other Services OT consult;Rehab consult  Frequency Min 3X/week   Plan Discharge plan needs to be updated;Frequency remains appropriate    Precautions / Restrictions Precautions Precautions: Fall Precaution Comments: old LBKA with prothesis Restrictions Weight Bearing Restrictions: No   Pertinent Vitals/Pain No c/o pain.      Mobility  Bed Mobility Bed Mobility: Supine to Sit Supine to Sit: 7: Independent Sitting - Scoot to Edge of Bed: 7: Independent Sit to Supine: Not Tested (comment) Transfers Transfers: Sit to Stand;Stand to Sit Sit to Stand: 6: Modified independent (Device/Increase time);From bed;From chair/3-in-1;With upper extremity assist Stand to Sit: 6: Modified independent (Device/Increase time);To chair/3-in-1;With upper extremity assist Details for Transfer Assistance: No assistance or instruction required.  Ambulation/Gait Ambulation/Gait Assistance: 6: Modified independent (Device/Increase time) Ambulation Distance (Feet): 200 Feet (100 ft x 2 with 2 minute sitting break.  ) Assistive device: Rolling walker Ambulation/Gait Assistance Details: pt continues to present dyspneic during gait training but endurance is improving.   Gait Pattern: Wide base of support;Step-through pattern;Trunk flexed Gait velocity: WFL General Gait Details: Pt appears to be comfortable using RW Stairs: No Wheelchair Mobility Wheelchair Mobility: No    Exercises     PT  Diagnosis:    PT Problem List:   PT Treatment Interventions:     PT Goals Acute Rehab PT Goals PT Goal Formulation: With patient Time For Goal Achievement: 07/21/12 Potential to Achieve Goals: Good Pt will go Supine/Side to Sit: with modified independence PT Goal: Supine/Side to Sit - Progress: Met Pt will go Sit to Supine/Side: with modified independence PT Goal: Sit to Supine/Side - Progress: Met Pt will go Sit to Stand: with modified independence PT Goal: Sit to Stand - Progress: Met Pt will go Stand to Sit: with modified independence PT Goal: Stand to Sit - Progress: Met Pt will Ambulate: >150 feet;with supervision;with rolling walker PT Goal: Ambulate - Progress: Progressing toward goal Pt will Go Up / Down Stairs: 3-5 stairs;with min assist;with least restrictive assistive device  Visit Information  Last PT Received On: 07/13/12 Assistance Needed: +1    Subjective Data  Subjective: i wish you would have come before I took off my leg.  Patient Stated Goal: to go home   Cognition  Cognition Overall Cognitive Status: Appears within functional limits for tasks assessed/performed Arousal/Alertness: Awake/alert Orientation Level: Appears intact for tasks assessed Behavior During Session: [facility] for tasks performed    Balance  Balance Balance Assessed: No  End of Session PT - End of Session Equipment Utilized During Treatment: Left lower extremity prosthesis;Gait belt Activity Tolerance: Patient tolerated treatment well Patient left: in chair;with call bell/phone within reach Nurse Communication: Mobility status   GP     Raywood Wailes 07/13/2012, 3:56 PM Rakayla Ricklefs L. Taylar Hartsough DPT [phone]

## 2012-07-13 NOTE — Progress Notes (Signed)
Patient seen, examined, and I agree with the above documentation, including the assessment and plan. Advancing diet, complete abx treatment, positive sign he had BM and tolerating solid foods  Pain control as needed Call with questions

## 2012-07-13 NOTE — Progress Notes (Signed)
Utilization review completed.  

## 2012-07-13 NOTE — Procedures (Signed)
I have seen and examined this patient and agree with the plan of care . No complaints seen on dialysis and tolerating well .  Dale [name] 07/13/2012, 8:31 AM

## 2012-07-13 NOTE — Progress Notes (Signed)
Patient ID: Dale [name], male   DOB: Jan 14, 1966, 47 y.o.   MRN: [id number] Dale [name] [facility]astro[facility] Progress Note  Subjective: Pt in dialysis- says he is feeling better. Pain in abdomen "more manageable". Able to tolerate mashed potatoes etc yesterday without difficulty.  Has had two Bm's on his own and says abdomen less distended. He is hoping to go home this weekend.  Objective:  Vital signs in last 24 hours: Temp:  [97.4 F (36.3 C)-97.7 F (36.5 C)] 97.5 F (36.4 C) (03/21 0739) Pulse Rate:  [85-96] 87 (03/21 0930) Resp:  [16-19] 16 (03/21 0930) BP: (116-178)/(59-91) 149/67 mmHg (03/21 0930) SpO2:  [99 %-100 %] 99 % (03/21 0739) Weight:  [259 lb 7.7 oz (117.7 kg)-262 lb 12.6 oz (119.2 kg)] 262 lb 12.6 oz (119.2 kg) (03/21 0739) Last BM Date: 06/25/12 General:   Alert,  Well-developed,obes WM in    NAD, on dialysis Heart:  Regular rate and rhythm; no murmurs Pulm;clear ABD; minimally tender and nondistended. Normal bowel sounds, without guarding, and without rebound.   Extremities:  Without edema. Neurologic:  Alert and  oriented x4;  grossly normal neurologically. Psych:  Alert and cooperative. Normal mood and affect.  Intake/Output from previous day: 03/20 0701 - 03/21 0700 In: 1080 [P.O.:1080] Out: 625 [Urine:625] Intake/Output this shift:    Lab Results:  Recent Labs  07/11/12 1248 07/13/12 0801  WBC 4.8 5.6  HGB 10.8* 9.4*  HCT 32.6* 27.5*  PLT 157 180   BMET  Recent Labs  07/11/12 1247 07/13/12 0801  NA 135 136  K 3.3* 3.2*  CL 98 97  CO2 25 25  GLUCOSE 165* 279*  BUN 49* 36*  CREATININE 4.52* 4.45*  CALCIUM 8.7 8.8   LFT  Recent Labs  07/10/12 1003  07/13/12 0801  PROT 6.4  --   --   ALBUMIN 2.2*  < > 2.2*  AST 32  --   --   ALT 21  --   --   ALKPHOS 73  --   --   BILITOT 0.6  --   --   BILIDIR 0.3  --   --   IBILI 0.3  --   --   < > = values in this interval not displayed. PT/INR No results found for this basename: LABPROT, INR,  in  the last 72 hours Hepatitis Panel No results found for this basename: HEPBSAG, HCVAB, HEPAIGM, HEPBIGM,  in the last 72 hours  Assessment / Plan: #1  46 yo WM with resolving ileus and Salmonellosis with enterocolitis and sepsis Day # 10 hospitalization. On [name] Will advance diet to solid food Ok from GI standpoint to discharge home over  Weekend, as long as able to eat without difficulty. #2 Multiple medical problems  As below  Principal Problem:   Salmonella bacteremia Active Problems:   History of renal transplant   S/P BKA (below knee amputation)   Obstructive sleep apnea   Anemia   Diabetes mellitus   ESRD (end stage renal disease)   Fever and chills   Diarrhea   Nausea   Leukocytosis   AKI (acute kidney injury)   Acute respiratory failure   Septic shock   Metabolic acidosis   Salmonella gastroenteritis     LOS: 10 days   Dale [name]  07/13/2012, 9:55 AM

## 2012-07-13 NOTE — Progress Notes (Signed)
Pt. Refused cpap. Pt. States he does not wear cpap. RT informed pt. To notify respiratory if he decides he wants to wear it tonight.

## 2012-07-13 NOTE — Progress Notes (Signed)
[name]  PROGRESS NOTE  Dale [name] [id number] DOB: 1965/10/13 DOA: 07/03/2012 PCP: Windy Kalata, MD  Brief narrative: 47 year old male admitted 07/03/2012 with chest pain after admission 06/19/2038 with myalgias 20 bowel movements with watery stool no blood and flulike symptoms with fever Eventually found to be salmonella positive, admission lactic acid 2.7 white count 18 on chronic steroids given history of renal transplant [cadaveric, 1995]  Past medical history-As per Problem list Chart reviewed as below- Is a type I diabetic since age 37 Admission 11/18/2000 with chest pain artery noncardiogenic Admission 09/24/2002 with cellulitis of the right thigh and sepsis Admission 08/13/2003 with right heel fasciitis and chronic Wagner grade 3 ulcer status post calcaneal excision February 25/05 Admission 08/19/2004 with acute appendicitis status post laparoscopic appendectomy 08/19/2004 Admission 10/21/2004 for left fifth ray amputation-notable history of MRSA Admission 07/22/2011 with hemoglobin 6.8-had a recent transfusion 3 weeks prior to this admission   Consultants:  GI-Pyrtle  Nephrology-Webb  ID -Comer  Gen suregry-L[facility]ton  Procedures:  CT abdomen pelvis 07/04/12 = circumferential thickening of distal small bowel proximal colon in keeping with enterocolitis, ischemia not excluded  Chest x-ray two-view 07/03/2012 = no edema or consolidation  Final x-ray 07/06/2012 = cardiomegaly with indistinct on the vascular favoring probably venous hypertension  Acute abd series 3.20=Abnormal although nonspecific bowel gas pattern as noted above.   ANTIBIOTICS:  Vanc IV 3/12 >>> 3/15  Vanc PO 3/13 >>> 3/15  Flagyl 3/11 >>> 3/15  Cefepime 3/11 >>> 3/15  Tamiflu 3/10 >>> 3/15  [name] 3/17 >>>>>07/17/12     Subjective  feels better.  Tolerating diet.  NO n/v/so Seen on HD Passed 2 small stools, abd pain notably less    Objective    Interim History: none  Telemetry: Non  tele  Objective: Filed Vitals:   07/13/12 1100 07/13/12 1125 07/13/12 1135 07/13/12 1320  BP: 132/81 124/59 135/61 139/55  Pulse: 89 85 86 95  Temp:    98.1 F (36.7 C)  TempSrc:    Oral  Resp: 18 20 18 18   Height:      Weight:   117.1 kg (258 lb 2.5 oz)   SpO2:   99% 99%    Intake/Output Summary (Last 24 hours) at 07/13/12 1553 Last data filed at 07/13/12 1352  Gross per 24 hour  Intake   1080 ml  Output   2826 ml  Net  -1746 ml    Exam:  General: Alert pleasant oriented Cardiovascular: S1-S2 no murmur rub or gallop Respiratory: Clinically clear Abdomen: Obese nontender nondistended Skin no lower extremity edema Neuro grossly intact  Data Reviewed: Basic Metabolic Panel:  Recent Labs Lab 07/07/12 0410  07/07/12 0411 07/08/12 1050 07/09/12 0500 07/11/12 1247 07/13/12 0801  NA  --   --  139 134* 135 135 136  K  --   < > 3.5 3.6 3.3* 3.3* 3.2*  CL  --   --  101 99 101 98 97  CO2  --   --  22 21 21 25 25   GLUCOSE  --   --  186* 282* 212* 165* 279*  BUN  --   --  71* 59* 69* 49* 36*  CREATININE  --   --  6.27* 4.45* 4.64* 4.52* 4.45*  CALCIUM  --   --  7.9* 8.1* 8.5 8.7 8.8  MG 2.3  --   --   --   --   --   --   PHOS  --   --  5.6*  --  4.5 4.7* 3.9  < > = values in this interval not displayed. Liver Function Tests:  Recent Labs Lab 07/07/12 0411 07/09/12 0500 07/10/12 1003 07/11/12 1247 07/13/12 0801  AST  --   --  32  --   --   ALT  --   --  21  --   --   ALKPHOS  --   --  73  --   --   BILITOT  --   --  0.6  --   --   PROT  --   --  6.4  --   --   ALBUMIN 2.0* 2.2* 2.2* 2.1* 2.2*    Recent Labs Lab 07/10/12 1003  LIPASE 46   No results found for this basename: AMMONIA,  in the last 168 hours CBC:  Recent Labs Lab 07/07/12 1725 07/08/12 07/09/12 0500 07/11/12 1248 07/13/12 0801  WBC 8.5 8.0 5.7 4.8 5.6  HGB 9.3* 8.6* 9.3* 10.8* 9.4*  HCT 28.4* 25.0* 27.3* 32.6* 27.5*  MCV 95.0 93.6 91.9 93.1 92.0  PLT 114* 131* 124* 157 180    Cardiac Enzymes: No results found for this basename: CKTOTAL, CKMB, CKMBINDEX, TROPONINI,  in the last 168 hours BNP: No components found with this basename: POCBNP,  CBG:  Recent Labs Lab 07/13/12 0335 07/13/12 0405 07/13/12 0427 07/13/12 0800 07/13/12 1221  GLUCAP 40* 67* 74 259* 213*    Recent Results (from the past 240 hour(s))  CLOSTRIDIUM DIFFICILE BY PCR     Status: None   Collection Time    07/03/12  3:54 PM      Result Value Range Status   C difficile by pcr NEGATIVE  NEGATIVE Final  CULTURE, BLOOD (ROUTINE X 2)     Status: None   Collection Time    07/03/12  4:20 PM      Result Value Range Status   Specimen Description BLOOD HAND LEFT   Final   Special Requests BOTTLES DRAWN AEROBIC AND ANAEROBIC 10CC   Final   Culture  Setup Time 07/04/2012 02:22   Final   Culture NO GROWTH 5 DAYS   Final   Report Status 07/10/2012 FINAL   Final  URINE CULTURE     Status: None   Collection Time    07/04/12 12:17 PM      Result Value Range Status   Specimen Description URINE, CATHETERIZED   Final   Special Requests NONE   Final   Culture  Setup Time 07/05/2012 02:41   Final   Colony Count NO GROWTH   Final   Culture NO GROWTH   Final   Report Status 07/05/2012 FINAL   Final  CULTURE, RESPIRATORY (NON-EXPECTORATED)     Status: None   Collection Time    07/04/12 12:20 PM      Result Value Range Status   Specimen Description TRACHEAL ASPIRATE   Final   Special Requests NONE   Final   Gram Stain     Final   Value: RARE WBC PRESENT, PREDOMINANTLY MONONUCLEAR     RARE SQUAMOUS EPITHELIAL CELLS PRESENT     NO ORGANISMS SEEN   Culture NO GROWTH 2 DAYS   Final   Report Status 07/07/2012 FINAL   Final  CLOSTRIDIUM DIFFICILE BY PCR     Status: None   Collection Time    07/04/12  4:08 PM      Result Value Range Status   C difficile by pcr NEGATIVE  NEGATIVE Final  CULTURE, BLOOD (ROUTINE  X 2)     Status: None   Collection Time    07/04/12  5:30 PM      Result Value Range  Status   Specimen Description BLOOD HAND LEFT   Final   Special Requests BOTTLES DRAWN AEROBIC ONLY 5CC   Final   Culture  Setup Time 07/05/2012 03:29   Final   Culture NO GROWTH 5 DAYS   Final   Report Status 07/11/2012 FINAL   Final  CULTURE, BLOOD (ROUTINE X 2)     Status: None   Collection Time    07/04/12  8:14 PM      Result Value Range Status   Specimen Description BLOOD LEFT HAND   Final   Special Requests BOTTLES DRAWN AEROBIC ONLY 4CC   Final   Culture  Setup Time 07/05/2012 02:42   Final   Culture NO GROWTH 5 DAYS   Final   Report Status 07/11/2012 FINAL   Final     Studies:              All Imaging reviewed and is as per above notation   Scheduled Meds: . allopurinol  100 mg Oral Daily  . aspirin  81 mg Oral Daily  . calcium acetate  667 mg Oral TID WC  . carvedilol  6.25 mg Oral Q M,W,F,Su-1800  . cefTAZidime (FORTAZ)  IV  2 g Intravenous Q M,W,F-2000  . cycloSPORINE modified  75 mg Oral BID   And  . cycloSPORINE modified  100 mg Oral BID  . darbepoetin (ARANESP) injection - DIALYSIS  150 mcg Intravenous Q Fri-HD  . famotidine  20 mg Oral Q1200  . feeding supplement  1 Container Oral BID BM  . HYDROmorphone      . insulin aspart  0-15 Units Subcutaneous Q4H  . insulin glargine  12 Units Subcutaneous Daily  . levothyroxine  88 mcg Oral QAC breakfast  . metoCLOPramide  5 mg Oral TID AC & HS  . predniSONE  5 mg Oral Q breakfast  . rOPINIRole  1 mg Oral QHS  . sodium chloride  3 mL Intravenous Q12H   Continuous Infusions: . sodium chloride 10 mL/hr at 07/08/12 0416     Assessment/Plan: 1. Salmonella bacteremia with severe septic shock - patient improved with IV fluid support. He did not require pressors. Patient received empiric IV antibiotics including Flagyl and cefepime. On March 17 the patient was started on IV Fortaz. On 07/10/12 the lab called final speciation result on the GNR bacteremia as Salmonella bacteremia - ID Dr. Linus Salmons called and he recommended 1  more week of iv Fortaz with HD. Stop date 07/17/12-Will get this with dialysis 2. Respiratory failure - secondary to abdominal distention and pain - intubated and mechanically ventilated from March 12 until March 14. Extubated without difficulty. Currently off oxygen supplements.  3. H/o OSA-Last echo showed no Pulm Htn- ef 55-60% 4. Enterocolitis - due to salmonella. attempting to graduate diet slowly per GI.  5. ESRD s/p renal cadaveric transplant 1995 - resumed HD during current admission - patient to be discharged on dialysis, Tuesday Thursday Saturday. 6. DM type I - on insulin pump at home, which has been  removed. Had low blood sugars overnight 3.20-3.21 in the 5o range, then rebounded to the 500 range-changed his lantus to 12 units daily and continue mod SSI-review in am.  If sugars are consistent, can d/c hiome 7. Severe abdominal pain and diarrheal illness-C. difficile negative x2, ?secondary to gastroparesis. Added  back metoclopramide Phenergan 3 times a day a.c. at bedtime to see if patient has bowel movements and resolution of pain-AXR 3.20 = Distension but patient passing flatus + nopw stool 8. Hypokalemia and hypocalcemia during the critical care time in the ICU-repleted  9. Patient on chronic immunosuppression for renal transplant including 5 mg of prednisone daily. This will need to be tapered as an out patient 10. Mild Hypotension-Mainly with dialysis-hold Coreg on non-dialysis days-give on M/W/F/Su   Code Status: Full Family Communication: None at bedside Disposition Plan: Patient set up for dialysis as an outpatient Tuesday Thursday Saturday [place]-P/Ot to eval and Rx and reassess for suitability of SNF   Verneita Griffes, MD  [facility] Pager [phone] 07/13/2012, 3:53 PM    LOS: 10 days

## 2012-07-13 NOTE — Progress Notes (Signed)
Appears to be infectious colitis.  Surgery will be available if needed.

## 2012-07-13 NOTE — Progress Notes (Addendum)
Inpatient Diabetes Program Recommendations  AACE/ADA: New Consensus Statement on Inpatient Glycemic Control (2013)  Target Ranges:  Prepandial:   less than 140 mg/dL      Peak postprandial:   less than 180 mg/dL (1-2 hours)      Critically ill patients:  140 - 180 mg/dL    Results for TAAHIR, MICHIELS (MRN [id number]) as of 07/13/2012 11:16  Ref. Range 07/12/2012 00:11 07/12/2012 04:20 07/12/2012 07:29 07/12/2012 11:42 07/12/2012 16:55 07/12/2012 19:01 07/12/2012 19:31  Glucose-Capillary Latest Range: 70-99 mg/dL 265 (H) 158 (H) 115 (H) 241 (H) 430 (H) 562 (HH) 523 (H)   Results for MCCLAIN, OTTINGER (MRN [id number]) as of 07/13/2012 11:16  Ref. Range 07/13/2012 00:37 07/13/2012 03:12 07/13/2012 03:35 07/13/2012 04:05 07/13/2012 04:27 07/13/2012 08:00  Glucose-Capillary Latest Range: 70-99 mg/dL 142 (H) 58 (L) 40 (LL) 67 (L) 74 259 (H)   Noted patient had extreme hyperglycemia last evening.  Received a large dose of Novolog (15 units) for this high CBG.  As a result, patient had hypoglycemia at 3am.  Noted Lantus decreased to 12 units daily (was 12 units bid).  Caution against this, as patient's basal rate per his insulin pump was 1.4 units/hr (Total basal=33.6 units/day).  Patient will likely need more basal insulin than 12 units daily.  Hypoglycemia was probably caused by the large dose of Novolog.  Recommend the following: 1. Increase Lantus back to 12 units bid 2. Change Novolog correction scale (SSI) to Sensitive scale tid ac + HS (currently ordered as Moderate Q4) 3. Add a small amount of set Novolog meal coverage- Novolog 3 units tid with meals (hold if patient eats <50%)  Will follow. Wyn Quaker RN, MSN, CDE Diabetes Coordinator Inpatient Diabetes Program [phone]

## 2012-07-14 LAB — GLUCOSE, CAPILLARY
Glucose-Capillary: 453 mg/dL — ABNORMAL HIGH (ref 70–99)
Glucose-Capillary: 202 mg/dL — ABNORMAL HIGH (ref 70–99)
Glucose-Capillary: 144 mg/dL — ABNORMAL HIGH (ref 70–99)
Glucose-Capillary: 332 mg/dL — ABNORMAL HIGH (ref 70–99)

## 2012-07-14 MED ORDER — OXYCODONE-ACETAMINOPHEN 5-325 MG PO TABS: 1.0000 | ORAL_TABLET | ORAL | Status: DC | PRN

## 2012-07-14 MED ORDER — CARVEDILOL 6.25 MG PO TABS: 6.2500 mg | ORAL_TABLET | ORAL | Status: DC

## 2012-07-14 MED ORDER — DEXTROSE 5 % IV SOLN: 2.0000 g | INTRAVENOUS | Status: AC

## 2012-07-14 NOTE — Discharge Summary (Signed)
Nita Sells, MD Physician Addendum Internal Medicine Progress Notes Service date: 07/14/2012 8:12 AM  Physician Discharge Summary    Dale [name] [id number] DOB: 1965-06-15 DOA: 07/03/2012   PCP: Windy Kalata, MD   Admit date: 07/03/2012 Discharge date: 07/14/2012   Time spent: 35 minutes   Recommendations for Outpatient Follow-up:   Recommend basic metabolic panel and CBC in 5-7 days Please check an A1c in about 2-3 months Patient may have further pain and pain medication needs-he states he has a history of fibromyalgia. Consider prescribing same Please continue to wean him off the cyclosporine and prednisone-he is no incisional disease and should not need these for transplant rejection purposes Stop date for Fortaz for Salmonella bacteremia is 07/17/2012 -this is to be given after dialysis 2 g.   Discharge Diagnoses:   Principal Problem:   Salmonella bacteremia Active Problems:   History of renal transplant   S/P BKA (below knee amputation)   Obstructive sleep apnea   Anemia   Diabetes mellitus   ESRD (end stage renal disease)   Fever and chills   Diarrhea   Nausea   Leukocytosis   AKI (acute kidney injury)   Acute respiratory failure   Septic shock   Metabolic acidosis   Salmonella gastroenteritis   Discharge Condition: Fair   Diet recommendation: Diabetic    Filed Weights     07/12/12 2107  07/13/12 0739  07/13/12 1135   Weight:  117.7 kg (259 lb 7.7 oz)  119.2 kg (262 lb 12.6 oz)  117.1 kg (258 lb 2.5 oz)      History of present illness:   47 year old male admitted 07/03/2012 with chest pain after admission 06/19/2038 with myalgias 20 bowel movements with watery stool no blood and flulike symptoms with fever   Eventually found to be salmonella positive, admission lactic acid 2.7 white count 18 on chronic steroids given history of renal transplant [cadaveric, [place] Hospital Course:  Salmonella bacteremia with severe septic shock -  patient improved with IV fluid support, initially was admitted to ICU where he decompensated eventually and then transitioned to floor.  He did not require pressors. Patient received empiric IV antibiotics including Flagyl and cefepime. On March 17 the patient was started on IV Fortaz. On 07/10/12 the lab called final speciation result on the GNR bacteremia as Salmonella bacteremia - ID Dr. Linus Salmons called and he recommended 1 more week of iv Fortaz with HD. Stop date 07/17/12-Will get this with dialysis 2 g. He did not need any further followup for this with infectious disease Respiratory failure - secondary to abdominal distention and pain - intubated and mechanically ventilated from March 12 until March 14. Extubated without difficulty. Currently off oxygen supplements.   H/o OSA-Last echo showed no Pulm Htn- ef 55-60% Enterocolitis - due to salmonella. She has been tolerating regular diet on discharge and although he has pain it is not severe ESRD s/p renal cadaveric transplant 1995 - resumed HD during current admission - patient to be discharged on dialysis, Tuesday Thursday Saturday. DM type I - on insulin pump at home, which has been removed. Had low blood sugars overnight 3.20-3.21 in the 5o range, then rebounded to the 500 range-he will resume his insulin pump at home with his supplies and states he is conversant on how to manage this Severe abdominal pain and diarrheal illness-C. difficile negative x2, ?secondary to gastroparesis-Gen. surgery was involved in his care and he did not require any Added back metoclopramide  3  times a day a.c. at bedtime.  he started pustule on 07/13/2038 Hypokalemia and hypocalcemia during the critical care time in the ICU-repleted   Anemia-likely multifactorial and secondary most probably to chronic/renal disease. The labs as an outpatient Patient on chronic immunosuppression for renal transplant including 5 mg of prednisone daily. This will need to be tapered as an out  patient-nephrology is aware of this Mild Hypotension-Mainly with dialysis-hold Coreg on non-dialysis days-give on M/W/F/Su-his amlodipine has been discontinued     Consultants:  GI-Pyrtle   Nephrology-Webb   ID -[facility]   Gen suregry-[facility] Vascular surgery  Procedures:  CT abdomen pelvis 07/04/12 = circumferential thickening of distal small bowel proximal colon in keeping with enterocolitis, ischemia not excluded   Chest x-ray two-view 07/03/2012 = no edema or consolidation   Final x-ray 07/06/2012 = cardiomegaly with indistinct on the vascular favoring probably venous hypertension   Acute abd series 3.20=Abnormal although nonspecific bowel gas pattern as noted above. Had rectal tube placed and removed   ANTIBIOTICS:   Vanc IV 3/12 >>> 3/15   Vanc PO 3/13 >>> 3/15   Flagyl 3/11 >>> 3/15   Cefepime 3/11 >>> 3/15   Tamiflu 3/10 >>> 3/15   [facility] 3/17 >>>>>07/17/12    Discharge Exam: Filed Vitals:     07/13/12 1125  07/13/12 1135  07/13/12 1320  07/13/12 1959   BP:  124/59  135/61  139/55  159/64   Pulse:  85  86  95  93   Temp:      98.1 F (36.7 C)  98 F (36.7 C)   TempSrc:      Oral  Oral   Resp:  20  18  18  18    Height:           Weight:    117.1 kg (258 lb 2.5 oz)       SpO2:    99%  99%  100%    Alert pleasant oriented sitting at bedside eating breakfast. States pain is 5/10 in the abdomen but no other   General: Alert Cardiovascular: S1-S2 murmur rub or gallop Respiratory: Clinically clear   Discharge Instructions    Discharge Orders     Future Appointments  Provider  Department  Dept Phone     08/02/2012 3:30 PM  Burnell Blanks, MD  [facility])  [phone]     Future Orders  Complete By  Expires        (HEART FAILURE PATIENTS) Call MD:  Anytime you have any of the following symptoms: 1) 3 pound weight gain in 24 hours or 5 pounds in 1 week 2) shortness of breath, with or without a dry hacking cough 3) swelling in the  hands, feet or stomach 4) if you have to sleep on extra pillows at night in order to breathe.   As directed         Call MD for:  persistant dizziness or light-headedness   As directed         Call MD for:  persistant nausea and vomiting   As directed         Call MD for:  redness, tenderness, or signs of infection (pain, swelling, redness, odor or green/yellow discharge around incision site)   As directed         Call MD for:  severe uncontrolled pain   As directed         Diet - low sodium heart healthy  As directed         Increase activity slowly   As directed              Medication List      STOP taking these medications           amLODipine 5 MG tablet   Commonly known as:  NORVASC         furosemide 80 MG tablet   Commonly known as:  LASIX         HYDROmorphone 2 MG tablet   Commonly known as:  DILAUDID          TAKE these medications           allopurinol 100 MG tablet   Commonly known as:  ZYLOPRIM   Take 200 mg by mouth daily. 2 Tabs         aspirin 81 MG tablet   Take 81 mg by mouth every evening.         calcium acetate 667 MG capsule   Commonly known as:  PHOSLO   Take 1,334-2,001 mg by mouth 5 (five) times daily. 2001 mg with meals and 1334 mg with snacks         carvedilol 6.25 MG tablet   Commonly known as:  COREG   Take 1 tablet (6.25 mg total) by mouth every Monday,Wednesday,Friday, and Sunday at 6 PM.         cycloSPORINE modified 100 MG capsule   Commonly known as:  NEORAL   Take 100 mg by mouth 2 (two) times daily. Neoral brand         cycloSPORINE modified 25 MG capsule   Commonly known as:  NEORAL   Take 75 mg by mouth 2 (two) times daily. Takes with 100 mg capsules for total 175 mg BID         dextrose 5 % SOLN 50 mL with cefTAZidime 2 G SOLR 2 g   Inject 2 g into the vein every Monday, Wednesday, and Friday at 8 PM.         diphenhydrAMINE 25 mg capsule   Commonly known as:  BENADRYL   Take 50 mg by mouth at bedtime as needed  for itching or sleep.         docusate sodium 100 MG capsule   Commonly known as:  COLACE   Take 200 mg by mouth 3 (three) times daily.         epoetin alfa 40000 UNIT/ML injection   Commonly known as:  EPOGEN,PROCRIT   Inject 40,000 Units into the skin every 21 ( twenty-one) days. If hemoglobin is below 12         GLUCAGON EMERGENCY 1 MG injection   Generic drug:  glucagon   Inject 1 mg into the muscle once as needed.         insulin pump 100 unit/ml Soln   Inject into the skin continuous. Humalog         levothyroxine 88 MCG tablet   Commonly known as:  SYNTHROID, LEVOTHROID   Take 88 mcg by mouth daily.         loratadine 10 MG tablet   Commonly known as:  CLARITIN   Take 10 mg by mouth daily.         metoCLOPramide 5 MG tablet   Commonly known as:  REGLAN   Take 5 mg by mouth 4 (four) times daily - after meals and at bedtime.  nitroGLYCERIN 0.4 MG SL tablet   Commonly known as:  NITROSTAT   Place 0.4 mg under the tongue every 5 (five) minutes as needed for chest pain.         predniSONE 5 MG tablet   Commonly known as:  DELTASONE   Take 5 mg by mouth Daily.         promethazine 25 MG tablet   Commonly known as:  PHENERGAN   Take 25 mg by mouth Every 6 hours as needed. For nausea         PROVENTIL HFA 108 (90 BASE) MCG/ACT inhaler   Generic drug:  albuterol   Inhale 1 puff into the lungs Every 6 hours as needed. For shortness of breath         ranitidine 150 MG tablet   Commonly known as:  ZANTAC   Take 1 tablet (150 mg total) by mouth 2 (two) times daily.         rOPINIRole 1 MG tablet   Commonly known as:  REQUIP   Take 1 mg by mouth Every evening. Take at 2000              --------------------------------------------------------------------------------   The results of significant diagnostics from this hospitalization (including imaging, microbiology, ancillary and laboratory) are listed below for reference.       Significant  Diagnostic Studies: Ct Abdomen Pelvis Wo Contrast   07/04/2012  *RADIOLOGY REPORT*  Clinical Data: Abdominal distension, pain.  CT ABDOMEN AND PELVIS WITHOUT CONTRAST  Technique:  Multidetector CT imaging of the abdomen and pelvis was performed following the standard protocol without intravenous contrast.  Comparison: 07/04/2012 radiograph, 09/02/2011 CT  Findings: Bibasilar opacities.  NG tube descends into the stomach. Coronary artery calcification.  Trace pericardial fluid versus thickening.  There is extensive streak artifact from patient contact with the gantry.  In addition, images are degraded by the lack of intravenous contrast.  Within these limitations, no definitive acute abnormality identified involving the liver, spleen, biliary system, pancreas, or adrenal glands.  Atrophic native kidneys. Transplant in the left lower quadrant.  No hydronephrosis or hydroureter.  Rectal tube in place.  Mild colonic diverticulosis.  The ileal loops and proximal colon (to the level of the mid transverse colon) show circumferential wall thickening.  No bowel wall pneumatosis. No free intraperitoneal air.  Small amount of free fluid within the right paracolic gutter and pelvis.  No lymphadenopathy.  There is advanced atherosclerotic calcification of the aorta and its branches. No aneurysmal dilatation. Further vascular evaluation not possible without intravenous contrast.  Foley within the decompressed bladder.  Calcified vas deferens, suggests underlying diabetes.  No acute osseous finding.  Surgical clips right groin.  IMPRESSION: Circumferential thickening of distal small bowel and proximal colon. In keeping with an enterocolitis.  Ischemia not excluded.  Discussed via telephone with Dr. Melonie Florida at 10:30 p.m. on 07/04/2012.   Original Report Authenticated By: Carlos Levering, M.D.     Dg Chest 2 View   07/03/2012  *RADIOLOGY REPORT*  Clinical Data: Fever and nausea  CHEST - 2 VIEW Comparison: June 17, 2012  Findings: The lungs are clear.  The heart size and pulmonary vascularity are within normal limits.  No adenopathy.  There is degenerative change in the thoracic spine.  IMPRESSION: No edema or consolidation.   Original Report Authenticated By: Lowella Grip, M.D.     Dg Chest Port 1 View   07/06/2012  *RADIOLOGY REPORT*  Clinical Data: Endotracheal tube assessment.  Diabetes. Respiratory failure.  PORTABLE CHEST - 1 VIEW  Comparison: 07/05/2012  Findings: Endotracheal tube tip is 2.8 cm above the carina.  Left central line tip projects over the SVC.  Nasogastric tube extends into the stomach.  Low lung volumes noted with cardiomegaly.  Linear subsegmental atelectasis observed in the lingula.  Indistinct pulmonary vasculature noted.  IMPRESSION:  1.  ET tube tip 2.8 cm above carina. 2.  Cardiomegaly with indistinct pulmonary vasculature favoring pulmonary venous hypertension. 3.  Subsegmental atelectasis in the lingula.   Original Report Authenticated By: Van Clines, M.D.     Dg Chest Port 1 View   07/05/2012  *RADIOLOGY REPORT*  Clinical Data: Evaluate endotracheal tube positioning  PORTABLE CHEST - 1 VIEW  Comparison: 07/04/2012; 07/03/2012; 06/17/2012  Findings: Grossly unchanged enlarged cardiac silhouette and mediastinal contours with apparent differences likely attributable to the decreased lung volumes and AP projection.  Stable position of support apparatus.  No pneumothorax.  Pulmonary vasculature is indistinct.  Grossly unchanged perihilar and bibasilar opacities, left greater than right.  Trace left-sided effusion is not excluded.  Unchanged bones.  IMPRESSION: 1.  Stable positioning of support apparatus.  No pneumothorax. 2.  Suspected mild pulmonary edema with possible trace left-sided effusion. 3.  Persistently reduced lung volumes with unchanged perihilar and bibasilar opacities, left greater than right, favored to represent atelectasis.   Original Report Authenticated By:  Jake Seats, MD     Dg Chest Portable 1 View   07/04/2012  *RADIOLOGY REPORT*  Clinical Data: Central line placement and intubation.  Sepsis.  PORTABLE CHEST - 1 VIEW  Comparison: Chest 07/04/2012.  Findings: The patient has a new left IJ approach central venous catheter with the tip projecting over the lower superior vena cava. Endotracheal tube is in place with the tip in good position, 2 cm above the carina.  There is no pneumothorax.  Cardiomegaly without edema is noted.  IMPRESSION:  1.  ET tube and left IJ catheter in good position.  No pneumothorax or acute abnormality. 2.  Cardiomegaly.   Original Report Authenticated By: Orlean Patten, M.D.     Dg Chest Port 1 View   07/04/2012  *RADIOLOGY REPORT*  Clinical Data: Short of breath  PORTABLE CHEST - 1 VIEW  Comparison: Chest radiograph 07/03/2012  Findings: Stable mildly enlarged heart silhouette.  No effusion, infiltrate, or pneumothorax.  Lung volumes.  IMPRESSION: Borderline enlarged heart silhouette.  No acute findings.   Original Report Authenticated By: Suzy Bouchard, M.D.     Dg Chest Portable 1 View   06/17/2012  *RADIOLOGY REPORT*  Clinical Data: Mid chest pain and shortness of breath.  PORTABLE CHEST - 1 VIEW  Comparison: 09/01/2011  Findings: Slightly shallow inspiration.  Borderline heart size and pulmonary vascularity are likely normal for technique.  No focal consolidation or airspace disease in the lungs.  No blunting of costophrenic angles.  No pneumothorax.  Mediastinal contours appear intact.  Interval removal of right central venous catheter. Otherwise no significant change.  IMPRESSION: No evidence of active pulmonary disease.   Original Report Authenticated By: Lucienne Capers, M.D.     Dg Abd 2 Views   07/12/2012  *RADIOLOGY REPORT*  Clinical Data: Enterocolitis.  Rule out ileus.  Mid abdominal pain.  ABDOMEN - 2 VIEW  Comparison: 07/05/2012.  Findings: Nasogastric tube has been removed.  Gas filled prominent  size colon (transverse colon measuring up to 8.9 cm).  No obvious free intraperitoneal air on decubitus view.  CT detected abnormal small  bowel loops not adequately assessed by plain film exam.  Vascular calcifications.  IMPRESSION: Abnormal although nonspecific bowel gas pattern as noted above.   Original Report Authenticated By: Genia Del, M.D.     Dg Abd Portable 1v   07/05/2012  *RADIOLOGY REPORT*  Clinical Data: Abdominal distension  PORTABLE ABDOMEN - 1 VIEW  Comparison: CT abdomen pelvis - 07/04/2012  Findings:  An enteric tube tip and side port overlie the expected location of the gastric fundus.  There is a minimal amount of air within the gastric antrum. Otherwise, there is a paucity of bowel gas precluding evaluation for obstruction.  Evaluation of pneumoperitoneum is degraded secondary to supine positioning exclusion of the lower thorax.  No definite pneumatosis or portal venous gas.  There is a small amount of enteric contrast within the rectum.  Suspected radiolucent Foley catheter overlies the pelvis.  No acute osseous abnormalities.  IMPRESSION: 1.  Paucity of bowel gas, precluding evaluation for obstruction. 2.  Enteric tube tip and side port project over the expected location of the gastric fundus.   Original Report Authenticated By: Jake Seats, MD     Dg Abd Portable 2v   07/04/2012  *RADIOLOGY REPORT*  Clinical Data: Abdominal distention and pain.  PORTABLE ABDOMEN - 2 VIEW  Comparison: None.  Findings: Image quality is degraded by body habitus.  Stomach is air-filled and markedly distended.  There appears to be small bowel distention as well with associated air fluid levels.  The lower abdomen is not included on the images. Numerous high density structures overlying the right abdomen on the left lateral decubitus view are likely artifactual, given their apparent absence on the supine view.  IMPRESSION: Image quality is degraded by body habitus.  Findings are suspicious for a  high-grade small bowel obstruction.   Original Report Authenticated By: Lorin Picket, M.D.       Microbiology: Recent Results (from the past 240 hour(s))   URINE CULTURE     Status: None     Collection Time      07/04/12 12:17 PM       Result  Value  Range  Status     Specimen Description  URINE, CATHETERIZED     Final     Special Requests  NONE     Final     Culture  Setup Time  07/05/2012 02:41     Final     Colony Count  NO GROWTH     Final     Culture  NO GROWTH     Final     Report Status  07/05/2012 FINAL     Final   CULTURE, RESPIRATORY (NON-EXPECTORATED)     Status: None     Collection Time      07/04/12 12:20 PM       Result  Value  Range  Status     Specimen Description  TRACHEAL ASPIRATE     Final     Special Requests  NONE     Final     Gram Stain        Final     Value:  RARE WBC PRESENT, PREDOMINANTLY MONONUCLEAR        RARE SQUAMOUS EPITHELIAL CELLS PRESENT        NO ORGANISMS SEEN     Culture  NO GROWTH 2 DAYS     Final     Report Status  07/07/2012 FINAL     Final   CLOSTRIDIUM DIFFICILE BY PCR  Status: None     Collection Time      07/04/12  4:08 PM       Result  Value  Range  Status     C difficile by pcr  NEGATIVE   NEGATIVE  Final   CULTURE, BLOOD (ROUTINE X 2)     Status: None     Collection Time      07/04/12  5:30 PM       Result  Value  Range  Status     Specimen Description  BLOOD HAND LEFT     Final     Special Requests  BOTTLES DRAWN AEROBIC ONLY 5CC     Final     Culture  Setup Time  07/05/2012 03:29     Final     Culture  NO GROWTH 5 DAYS     Final     Report Status  07/11/2012 FINAL     Final   CULTURE, BLOOD (ROUTINE X 2)     Status: None     Collection Time      07/04/12  8:14 PM       Result  Value  Range  Status     Specimen Description  BLOOD LEFT HAND     Final     Special Requests  BOTTLES DRAWN AEROBIC ONLY 4CC     Final     Culture  Setup Time  07/05/2012 02:42     Final     Culture  NO GROWTH 5 DAYS     Final     Report  Status  07/11/2012 FINAL     Final      Labs: Basic Metabolic Panel: Recent Labs Lab  07/08/12 1050  07/09/12 0500  07/11/12 1247  07/13/12 0801   NA  134*  135  135  136   K  3.6  3.3*  3.3*  3.2*   CL  99  101  98  97   CO2  21  21  25  25    GLUCOSE  282*  212*  165*  279*   BUN  59*  69*  49*  36*   CREATININE  4.45*  4.64*  4.52*  4.45*   CALCIUM  8.1*  8.5  8.7  8.8   PHOS   --   4.5  4.7*  3.9    Liver Function Tests: Recent Labs Lab  07/09/12 0500  07/10/12 1003  07/11/12 1247  07/13/12 0801   AST   --   32   --    --    ALT   --   21   --    --    ALKPHOS   --   73   --    --    BILITOT   --   0.6   --    --    PROT   --   6.4   --    --    ALBUMIN  2.2*  2.2*  2.1*  2.2*    Recent Labs Lab  07/10/12 1003   LIPASE  46    No results found for this basename: AMMONIA,  in the last 168 hours CBC: Recent Labs Lab  07/07/12 1725  07/08/12  07/09/12 0500  07/11/12 1248  07/13/12 0801   WBC  8.5  8.0  5.7  4.8  5.6   HGB  9.3*  8.6*  9.3*  10.8*  9.4*  HCT  28.4*  25.0*  27.3*  32.6*  27.5*   MCV  95.0  93.6  91.9  93.1  92.0   PLT  114*  131*  124*  157  180    Cardiac Enzymes: No results found for this basename: CKTOTAL, CKMB, CKMBINDEX, TROPONINI,  in the last 168 hours BNP: BNP (last 3 results) No results found for this basename: PROBNP,  in the last 8760 hours CBG: Recent Labs Lab  07/13/12 1221  07/13/12 1525  07/13/12 2002  07/13/12 2351  07/14/12 0414   GLUCAP  213*  453*  361*  400*  202*            Signed:   Nita Sells        [facility] 07/14/2012, 8:12 AM           Revision History...     Date/Time User Action   07/14/2012 11:28 AM Nita Sells, MD Addend   07/14/2012 8:23 AM Nita Sells, MD Sign  View Details Report

## 2012-07-14 NOTE — Progress Notes (Deleted)
Physician Discharge Summary  Khizar Smither [id number] DOB: 1966/02/26 DOA: 07/03/2012  PCP: Windy Kalata, MD  Admit date: 07/03/2012 Discharge date: 07/14/2012  Time spent: 35 minutes  Recommendations for Outpatient Follow-up:  1. Recommend basic metabolic panel and CBC in 5-7 days 2. Please check an A1c in about 2-3 months 3. Patient may have further pain and pain medication needs-he states he has a history of fibromyalgia. Consider prescribing same 4. Please continue to wean him off the cyclosporine and prednisone-he is no incisional disease and should not need these for transplant rejection purposes 5. Stop date for [place] for Salmonella bacteremia is 07/17/2012 -this is to be given after dialysis 2 g.  Discharge Diagnoses:  Principal Problem:   Salmonella bacteremia Active Problems:   History of renal transplant   S/P BKA (below knee amputation)   Obstructive sleep apnea   Anemia   Diabetes mellitus   ESRD (end stage renal disease)   Fever and chills   Diarrhea   Nausea   Leukocytosis   AKI (acute kidney injury)   Acute respiratory failure   Septic shock   Metabolic acidosis   Salmonella gastroenteritis   Discharge Condition: Fair  Diet recommendation: Diabetic  Filed Weights   07/12/12 2107 07/13/12 0739 07/13/12 1135  Weight: 117.7 kg (259 lb 7.7 oz) 119.2 kg (262 lb 12.6 oz) 117.1 kg (258 lb 2.5 oz)    History of present illness:  47 year old male admitted 07/03/2012 with chest pain after admission 06/19/2038 with myalgias 20 bowel movements with watery stool no blood and flulike symptoms with fever  Eventually found to be salmonella positive, admission lactic acid 2.7 white count 18 on chronic steroids given history of renal transplant [cadaveric, [place] Hospital Course:  1. Salmonella bacteremia with severe septic shock - patient improved with IV fluid support, initially was admitted to ICU where he decompensated eventually and then transitioned to  floor.  He did not require pressors. Patient received empiric IV antibiotics including Flagyl and cefepime. On March 17 the patient was started on IV Fortaz. On 07/10/12 the lab called final speciation result on the GNR bacteremia as Salmonella bacteremia - ID Dr. Linus Salmons called and he recommended 1 more week of iv Fortaz with HD. Stop date 07/17/12-Will get this with dialysis 2 g. He did not need any further followup for this with infectious disease 2. Respiratory failure - secondary to abdominal distention and pain - intubated and mechanically ventilated from March 12 until March 14. Extubated without difficulty. Currently off oxygen supplements.  3. H/o OSA-Last echo showed no Pulm Htn- ef 55-60% 4. Enterocolitis - due to salmonella. She has been tolerating regular diet on discharge and although he has pain it is not severe 5. ESRD s/p renal cadaveric transplant 1995 - resumed HD during current admission - patient to be discharged on dialysis, Tuesday Thursday Saturday. 6. DM type I - on insulin pump at home, which has been removed. Had low blood sugars overnight 3.20-3.21 in the 5o range, then rebounded to the 500 range-he will resume his insulin pump at home with his supplies and states he is conversant on how to manage this 7. Severe abdominal pain and diarrheal illness-C. difficile negative x2, ?secondary to gastroparesis-Gen. surgery was involved in his care and he did not require any Added back metoclopramide  3 times a day a.c. at bedtime.  he started pustule on 07/13/2038 8. Hypokalemia and hypocalcemia during the critical care time in the ICU-repleted  9. Anemia-likely multifactorial and secondary  most probably to chronic/renal disease. The labs as an outpatient 10. Patient on chronic immunosuppression for renal transplant including 5 mg of prednisone daily. This will need to be tapered as an out patient-nephrology is aware of this 11. Mild Hypotension-Mainly with dialysis-hold Coreg on  non-dialysis days-give on M/W/F/Su-his amlodipine has been discontinued   Consultants:  GI-Pyrtle  Nephrology-[facility]bb  ID -[facility]  Gen suregry-[facility] Vascular surgery  Procedures:  CT abdomen pelvis 07/04/12 = circumferential thickening of distal small bowel proximal colon in keeping with enterocolitis, ischemia not excluded  Chest x-ray two-view 07/03/2012 = no edema or consolidation  Final x-ray 07/06/2012 = cardiomegaly with indistinct on the vascular favoring probably venous hypertension  Acute abd series 3.20=Abnormal although nonspecific bowel gas pattern as noted above. Had rectal tube placed and removed  ANTIBIOTICS:  Vanc IV 3/12 >>> 3/15  Vanc PO 3/13 >>> 3/15  Flagyl 3/11 >>> 3/15  Cefepime 3/11 >>> 3/15  Tamiflu 3/10 >>> 3/15  [facility] 3/17 >>>>>07/17/12   Discharge Exam: Filed Vitals:   07/13/12 1125 07/13/12 1135 07/13/12 1320 07/13/12 1959  BP: 124/59 135/61 139/55 159/64  Pulse: 85 86 95 93  Temp:   98.1 F (36.7 C) 98 F (36.7 C)  TempSrc:   Oral Oral  Resp: 20 18 18 18   Height:      Weight:  117.1 kg (258 lb 2.5 oz)    SpO2:  99% 99% 100%   Alert pleasant oriented sitting at bedside eating breakfast. States pain is 5/10 in the abdomen but no other  General: Alert Cardiovascular: S1-S2 murmur rub or gallop Respiratory: Clinically clear  Discharge Instructions  Discharge Orders   Future Appointments Provider Department Dept Phone   08/02/2012 3:30 PM Burnell Blanks, MD [facility] [place]) [phone]   Future Orders Complete By Expires     (HEART FAILURE PATIENTS) Call MD:  Anytime you have any of the following symptoms: 1) 3 pound weight gain in 24 hours or 5 pounds in 1 week 2) shortness of breath, with or without a dry hacking cough 3) swelling in the hands, feet or stomach 4) if you have to sleep on extra pillows at night in order to breathe.  As directed     Call MD for:  persistant dizziness or light-headedness  As directed      Call MD for:  persistant nausea and vomiting  As directed     Call MD for:  redness, tenderness, or signs of infection (pain, swelling, redness, odor or green/yellow discharge around incision site)  As directed     Call MD for:  severe uncontrolled pain  As directed     Diet - low sodium heart healthy  As directed     Increase activity slowly  As directed         Medication List    STOP taking these medications       amLODipine 5 MG tablet  Commonly known as:  NORVASC     furosemide 80 MG tablet  Commonly known as:  LASIX     HYDROmorphone 2 MG tablet  Commonly known as:  DILAUDID      TAKE these medications       allopurinol 100 MG tablet  Commonly known as:  ZYLOPRIM  Take 200 mg by mouth daily. 2 Tabs     aspirin 81 MG tablet  Take 81 mg by mouth every evening.     calcium acetate 667 MG capsule  Commonly known as:  PHOSLO  Take 1,334-2,001 mg by mouth 5 (five) times daily. 2001 mg with meals and 1334 mg with snacks     carvedilol 6.25 MG tablet  Commonly known as:  COREG  Take 1 tablet (6.25 mg total) by mouth every Monday,Wednesday,Friday, and Sunday at 6 PM.     cycloSPORINE modified 100 MG capsule  Commonly known as:  NEORAL  Take 100 mg by mouth 2 (two) times daily. Neoral brand     cycloSPORINE modified 25 MG capsule  Commonly known as:  NEORAL  Take 75 mg by mouth 2 (two) times daily. Takes with 100 mg capsules for total 175 mg BID     dextrose 5 % SOLN 50 mL with cefTAZidime 2 G SOLR 2 g  Inject 2 g into the vein every Monday, Wednesday, and Friday at 8 PM.     diphenhydrAMINE 25 mg capsule  Commonly known as:  BENADRYL  Take 50 mg by mouth at bedtime as needed for itching or sleep.     docusate sodium 100 MG capsule  Commonly known as:  COLACE  Take 200 mg by mouth 3 (three) times daily.     epoetin alfa 40000 UNIT/ML injection  Commonly known as:  EPOGEN,PROCRIT  Inject 40,000 Units into the skin every 21 ( twenty-one) days. If hemoglobin  is below 12     GLUCAGON EMERGENCY 1 MG injection  Generic drug:  glucagon  Inject 1 mg into the muscle once as needed.     insulin pump 100 unit/ml Soln  Inject into the skin continuous. Humalog     levothyroxine 88 MCG tablet  Commonly known as:  SYNTHROID, LEVOTHROID  Take 88 mcg by mouth daily.     loratadine 10 MG tablet  Commonly known as:  CLARITIN  Take 10 mg by mouth daily.     metoCLOPramide 5 MG tablet  Commonly known as:  REGLAN  Take 5 mg by mouth 4 (four) times daily - after meals and at bedtime.     nitroGLYCERIN 0.4 MG SL tablet  Commonly known as:  NITROSTAT  Place 0.4 mg under the tongue every 5 (five) minutes as needed for chest pain.     predniSONE 5 MG tablet  Commonly known as:  DELTASONE  Take 5 mg by mouth Daily.     promethazine 25 MG tablet  Commonly known as:  PHENERGAN  Take 25 mg by mouth Every 6 hours as needed. For nausea     PROVENTIL HFA 108 (90 BASE) MCG/ACT inhaler  Generic drug:  albuterol  Inhale 1 puff into the lungs Every 6 hours as needed. For shortness of breath     ranitidine 150 MG tablet  Commonly known as:  ZANTAC  Take 1 tablet (150 mg total) by mouth 2 (two) times daily.     rOPINIRole 1 MG tablet  Commonly known as:  REQUIP  Take 1 mg by mouth Every evening. Take at 2000          The results of significant diagnostics from this hospitalization (including imaging, microbiology, ancillary and laboratory) are listed below for reference.    Significant Diagnostic Studies: Ct Abdomen Pelvis Wo Contrast  07/04/2012  *RADIOLOGY REPORT*  Clinical Data: Abdominal distension, pain.  CT ABDOMEN AND PELVIS WITHOUT CONTRAST  Technique:  Multidetector CT imaging of the abdomen and pelvis was performed following the standard protocol without intravenous contrast.  Comparison: 07/04/2012 radiograph, 09/02/2011 CT  Findings: Bibasilar opacities.  NG tube descends into the stomach.  Coronary artery calcification.  Trace pericardial  fluid versus thickening.  There is extensive streak artifact from patient contact with the gantry.  In addition, images are degraded by the lack of intravenous contrast.  Within these limitations, no definitive acute abnormality identified involving the liver, spleen, biliary system, pancreas, or adrenal glands.  Atrophic native kidneys. Transplant in the left lower quadrant.  No hydronephrosis or hydroureter.  Rectal tube in place.  Mild colonic diverticulosis.  The ileal loops and proximal colon (to the level of the mid transverse colon) show circumferential wall thickening.  No bowel wall pneumatosis. No free intraperitoneal air.  Small amount of free fluid within the right paracolic gutter and pelvis.  No lymphadenopathy.  There is advanced atherosclerotic calcification of the aorta and its branches. No aneurysmal dilatation. Further vascular evaluation not possible without intravenous contrast.  Foley within the decompressed bladder.  Calcified vas deferens, suggests underlying diabetes.  No acute osseous finding.  Surgical clips right groin.  IMPRESSION: Circumferential thickening of distal small bowel and proximal colon. In keeping with an enterocolitis.  Ischemia not excluded.  Discussed via telephone with Dr. Melonie Florida at 10:30 p.m. on 07/04/2012.   Original Report Authenticated By: Carlos Levering, M.D.    Dg Chest 2 View  07/03/2012  *RADIOLOGY REPORT*  Clinical Data: Fever and nausea  CHEST - 2 VIEW  Comparison: June 17, 2012  Findings: The lungs are clear.  The heart size and pulmonary vascularity are within normal limits.  No adenopathy.  There is degenerative change in the thoracic spine.  IMPRESSION: No edema or consolidation.   Original Report Authenticated By: Lowella Grip, M.D.    Dg Chest Port 1 View  07/06/2012  *RADIOLOGY REPORT*  Clinical Data: Endotracheal tube assessment.  Diabetes. Respiratory failure.  PORTABLE CHEST - 1 VIEW  Comparison: 07/05/2012  Findings:  Endotracheal tube tip is 2.8 cm above the carina.  Left central line tip projects over the SVC.  Nasogastric tube extends into the stomach.  Low lung volumes noted with cardiomegaly.  Linear subsegmental atelectasis observed in the lingula.  Indistinct pulmonary vasculature noted.  IMPRESSION:  1.  ET tube tip 2.8 cm above carina. 2.  Cardiomegaly with indistinct pulmonary vasculature favoring pulmonary venous hypertension. 3.  Subsegmental atelectasis in the lingula.   Original Report Authenticated By: Van Clines, M.D.    Dg Chest Port 1 View  07/05/2012  *RADIOLOGY REPORT*  Clinical Data: Evaluate endotracheal tube positioning  PORTABLE CHEST - 1 VIEW  Comparison: 07/04/2012; 07/03/2012; 06/17/2012  Findings: Grossly unchanged enlarged cardiac silhouette and mediastinal contours with apparent differences likely attributable to the decreased lung volumes and AP projection.  Stable position of support apparatus.  No pneumothorax.  Pulmonary vasculature is indistinct.  Grossly unchanged perihilar and bibasilar opacities, left greater than right.  Trace left-sided effusion is not excluded.  Unchanged bones.  IMPRESSION: 1.  Stable positioning of support apparatus.  No pneumothorax. 2.  Suspected mild pulmonary edema with possible trace left-sided effusion. 3.  Persistently reduced lung volumes with unchanged perihilar and bibasilar opacities, left greater than right, favored to represent atelectasis.   Original Report Authenticated By: Jake Seats, MD    Dg Chest Portable 1 View  07/04/2012  *RADIOLOGY REPORT*  Clinical Data: Central line placement and intubation.  Sepsis.  PORTABLE CHEST - 1 VIEW  Comparison: Chest 07/04/2012.  Findings: The patient has a new left IJ approach central venous catheter with the tip projecting over the lower superior vena cava. Endotracheal tube  is in place with the tip in good position, 2 cm above the carina.  There is no pneumothorax.  Cardiomegaly without edema is noted.   IMPRESSION:  1.  ET tube and left IJ catheter in good position.  No pneumothorax or acute abnormality. 2.  Cardiomegaly.   Original Report Authenticated By: Orlean Patten, M.D.    Dg Chest Port 1 View  07/04/2012  *RADIOLOGY REPORT*  Clinical Data: Short of breath  PORTABLE CHEST - 1 VIEW  Comparison: Chest radiograph 07/03/2012  Findings: Stable mildly enlarged heart silhouette.  No effusion, infiltrate, or pneumothorax.  Lung volumes.  IMPRESSION: Borderline enlarged heart silhouette.  No acute findings.   Original Report Authenticated By: Suzy Bouchard, M.D.    Dg Chest Portable 1 View  06/17/2012  *RADIOLOGY REPORT*  Clinical Data: Mid chest pain and shortness of breath.  PORTABLE CHEST - 1 VIEW  Comparison: 09/01/2011  Findings: Slightly shallow inspiration.  Borderline heart size and pulmonary vascularity are likely normal for technique.  No focal consolidation or airspace disease in the lungs.  No blunting of costophrenic angles.  No pneumothorax.  Mediastinal contours appear intact.  Interval removal of right central venous catheter. Otherwise no significant change.  IMPRESSION: No evidence of active pulmonary disease.   Original Report Authenticated By: Lucienne Capers, M.D.    Dg Abd 2 Views  07/12/2012  *RADIOLOGY REPORT*  Clinical Data: Enterocolitis.  Rule out ileus.  Mid abdominal pain.  ABDOMEN - 2 VIEW  Comparison: 07/05/2012.  Findings: Nasogastric tube has been removed.  Gas filled prominent size colon (transverse colon measuring up to 8.9 cm).  No obvious free intraperitoneal air on decubitus view.  CT detected abnormal small bowel loops not adequately assessed by plain film exam.  Vascular calcifications.  IMPRESSION: Abnormal although nonspecific bowel gas pattern as noted above.   Original Report Authenticated By: Genia Del, M.D.    Dg Abd Portable 1v  07/05/2012  *RADIOLOGY REPORT*  Clinical Data: Abdominal distension  PORTABLE ABDOMEN - 1 VIEW  Comparison: CT abdomen  pelvis - 07/04/2012  Findings:  An enteric tube tip and side port overlie the expected location of the gastric fundus.  There is a minimal amount of air within the gastric antrum. Otherwise, there is a paucity of bowel gas precluding evaluation for obstruction.  Evaluation of pneumoperitoneum is degraded secondary to supine positioning exclusion of the lower thorax.  No definite pneumatosis or portal venous gas.  There is a small amount of enteric contrast within the rectum.  Suspected radiolucent Foley catheter overlies the pelvis.  No acute osseous abnormalities.  IMPRESSION: 1.  Paucity of bowel gas, precluding evaluation for obstruction. 2.  Enteric tube tip and side port project over the expected location of the gastric fundus.   Original Report Authenticated By: Jake Seats, MD    Dg Abd Portable 2v  07/04/2012  *RADIOLOGY REPORT*  Clinical Data: Abdominal distention and pain.  PORTABLE ABDOMEN - 2 VIEW  Comparison: None.  Findings: Image quality is degraded by body habitus.  Stomach is air-filled and markedly distended.  There appears to be small bowel distention as well with associated air fluid levels.  The lower abdomen is not included on the images. Numerous high density structures overlying the right abdomen on the left lateral decubitus view are likely artifactual, given their apparent absence on the supine view.  IMPRESSION: Image quality is degraded by body habitus.  Findings are suspicious for a high-grade small bowel obstruction.   Original Report  Authenticated By: Lorin Picket, M.D.     Microbiology: Recent Results (from the past 240 hour(s))  URINE CULTURE     Status: None   Collection Time    07/04/12 12:17 PM      Result Value Range Status   Specimen Description URINE, CATHETERIZED   Final   Special Requests NONE   Final   Culture  Setup Time 07/05/2012 02:41   Final   Colony Count NO GROWTH   Final   Culture NO GROWTH   Final   Report Status 07/05/2012 FINAL   Final   CULTURE, RESPIRATORY (NON-EXPECTORATED)     Status: None   Collection Time    07/04/12 12:20 PM      Result Value Range Status   Specimen Description TRACHEAL ASPIRATE   Final   Special Requests NONE   Final   Gram Stain     Final   Value: RARE WBC PRESENT, PREDOMINANTLY MONONUCLEAR     RARE SQUAMOUS EPITHELIAL CELLS PRESENT     NO ORGANISMS SEEN   Culture NO GROWTH 2 DAYS   Final   Report Status 07/07/2012 FINAL   Final  CLOSTRIDIUM DIFFICILE BY PCR     Status: None   Collection Time    07/04/12  4:08 PM      Result Value Range Status   C difficile by pcr NEGATIVE  NEGATIVE Final  CULTURE, BLOOD (ROUTINE X 2)     Status: None   Collection Time    07/04/12  5:30 PM      Result Value Range Status   Specimen Description BLOOD HAND LEFT   Final   Special Requests BOTTLES DRAWN AEROBIC ONLY 5CC   Final   Culture  Setup Time 07/05/2012 03:29   Final   Culture NO GROWTH 5 DAYS   Final   Report Status 07/11/2012 FINAL   Final  CULTURE, BLOOD (ROUTINE X 2)     Status: None   Collection Time    07/04/12  8:14 PM      Result Value Range Status   Specimen Description BLOOD LEFT HAND   Final   Special Requests BOTTLES DRAWN AEROBIC ONLY 4CC   Final   Culture  Setup Time 07/05/2012 02:42   Final   Culture NO GROWTH 5 DAYS   Final   Report Status 07/11/2012 FINAL   Final     Labs: Basic Metabolic Panel:  Recent Labs Lab 07/08/12 1050 07/09/12 0500 07/11/12 1247 07/13/12 0801  NA 134* 135 135 136  K 3.6 3.3* 3.3* 3.2*  CL 99 101 98 97  CO2 21 21 25 25   GLUCOSE 282* 212* 165* 279*  BUN 59* 69* 49* 36*  CREATININE 4.45* 4.64* 4.52* 4.45*  CALCIUM 8.1* 8.5 8.7 8.8  PHOS  --  4.5 4.7* 3.9   Liver Function Tests:  Recent Labs Lab 07/09/12 0500 07/10/12 1003 07/11/12 1247 07/13/12 0801  AST  --  32  --   --   ALT  --  21  --   --   ALKPHOS  --  73  --   --   BILITOT  --  0.6  --   --   PROT  --  6.4  --   --   ALBUMIN 2.2* 2.2* 2.1* 2.2*    Recent Labs Lab  07/10/12 1003  LIPASE 46   No results found for this basename: AMMONIA,  in the last 168 hours CBC:  Recent Labs Lab 07/07/12 1725  07/08/12 07/09/12 0500 07/11/12 1248 07/13/12 0801  WBC 8.5 8.0 5.7 4.8 5.6  HGB 9.3* 8.6* 9.3* 10.8* 9.4*  HCT 28.4* 25.0* 27.3* 32.6* 27.5*  MCV 95.0 93.6 91.9 93.1 92.0  PLT 114* 131* 124* 157 180   Cardiac Enzymes: No results found for this basename: CKTOTAL, CKMB, CKMBINDEX, TROPONINI,  in the last 168 hours BNP: BNP (last 3 results) No results found for this basename: PROBNP,  in the last 8760 hours CBG:  Recent Labs Lab 07/13/12 1221 07/13/12 1525 07/13/12 2002 07/13/12 2351 07/14/12 0414  GLUCAP 213* 453* 361* 400* 202*       Signed:  Nita Sells  [facility] 07/14/2012, 8:12 AM

## 2012-07-14 NOTE — Progress Notes (Signed)
[facility] ROUNDING NOTE   Subjective:   Interval History: none.  Objective:  Vital signs in last 24 hours:  Temp:  [98 F (36.7 C)-98.2 F (36.8 C)] 98.2 F (36.8 C) (03/22 0930) Pulse Rate:  [85-95] 88 (03/22 0930) Resp:  [18-20] 18 (03/22 0930) BP: (124-172)/(55-80) 172/80 mmHg (03/22 0930) SpO2:  [98 %-100 %] 98 % (03/22 0930) Weight:  [117.1 kg (258 lb 2.5 oz)] 117.1 kg (258 lb 2.5 oz) (03/21 1135)  Weight change: 1.5 kg (3 lb 4.9 oz) Filed Weights   07/12/12 2107 07/13/12 0739 07/13/12 1135  Weight: 117.7 kg (259 lb 7.7 oz) 119.2 kg (262 lb 12.6 oz) 117.1 kg (258 lb 2.5 oz)    Intake/Output: I/O last 3 completed shifts: In: 840 [P.O.:840] Out: 2476 [Urine:275; Other:2201]   Intake/Output this shift:     [id number] resting in bed- NGT in-situ  [id number] RRR, normal S1 and S2  Resp:Decreased BS over bases- otherwise CTA  [id number], obese, NT, BS normal  [id number]    Basic Metabolic Panel:  Recent Labs Lab 07/08/12 1050 07/09/12 0500 07/11/12 1247 07/13/12 0801  NA 134* 135 135 136  K 3.6 3.3* 3.3* 3.2*  CL 99 101 98 97  CO2 21 21 25 25   GLUCOSE 282* 212* 165* 279*  BUN 59* 69* 49* 36*  CREATININE 4.45* 4.64* 4.52* 4.45*  CALCIUM 8.1* 8.5 8.7 8.8  PHOS  --  4.5 4.7* 3.9    Liver Function Tests:  Recent Labs Lab 07/09/12 0500 07/10/12 1003 07/11/12 1247 07/13/12 0801  AST  --  32  --   --   ALT  --  21  --   --   ALKPHOS  --  73  --   --   BILITOT  --  0.6  --   --   PROT  --  6.4  --   --   ALBUMIN 2.2* 2.2* 2.1* 2.2*    Recent Labs Lab 07/10/12 1003  LIPASE 46   No results found for this basename: AMMONIA,  in the last 168 hours  CBC:  Recent Labs Lab 07/07/12 1725 07/08/12 07/09/12 0500 07/11/12 1248 07/13/12 0801  WBC 8.5 8.0 5.7 4.8 5.6  HGB 9.3* 8.6* 9.3* 10.8* 9.4*  HCT 28.4* 25.0* 27.3* 32.6* 27.5*  MCV 95.0 93.6 91.9 93.1 92.0  PLT 114* 131* 124* 157 180    Cardiac Enzymes: No results  found for this basename: CKTOTAL, CKMB, CKMBINDEX, TROPONINI,  in the last 168 hours  BNP: No components found with this basename: POCBNP,   CBG:  Recent Labs Lab 07/13/12 1525 07/13/12 2002 07/13/12 2351 07/14/12 0414 07/14/12 0810  GLUCAP 453* 361* 400* 202* 144*    Microbiology: Results for orders placed during the hospital encounter of 07/03/12  CULTURE, BLOOD (ROUTINE X 2)     Status: None   Collection Time    07/03/12  1:50 PM      Result Value Range Status   Specimen Description BLOOD LEFT FOREARM   Final   Special Requests BOTTLES DRAWN AEROBIC ONLY 10CC   Final   Culture  Setup Time 07/04/2012 04:59   Final   Culture     Final   Value: SALMONELLA SPECIES     Note: CRITICAL RESULT CALLED TO, READ BACK BY AND VERIFIED WITH: NADINE WELLINGTON@1042  ON [id number] BY [facility] FAXED TO [facility] 07/10/12 BY CATAR Referred to [facility] in [place], [place] for  Serotyping.     Note: Gram Stain Report Called to,Read Back By and Verified With: RACHEL FOUNTAIN 07/08/12 @ 12:33PM BY RUSCA.   Report Status PENDING   Incomplete   Organism ID, Bacteria SALMONELLA SPECIES   Final  URINE CULTURE     Status: None   Collection Time    07/03/12  2:51 PM      Result Value Range Status   Specimen Description URINE, CLEAN CATCH   Final   Special Requests Immunocompromised   Final   Culture  Setup Time 07/03/2012 15:45   Final   Colony Count NO GROWTH   Final   Culture NO GROWTH   Final   Report Status 07/05/2012 FINAL   Final  CLOSTRIDIUM DIFFICILE BY PCR     Status: None   Collection Time    07/03/12  3:54 PM      Result Value Range Status   C difficile by pcr NEGATIVE  NEGATIVE Final  CULTURE, BLOOD (ROUTINE X 2)     Status: None   Collection Time    07/03/12  4:20 PM      Result Value Range Status   Specimen Description BLOOD HAND LEFT   Final   Special Requests BOTTLES DRAWN AEROBIC AND ANAEROBIC 10CC   Final   Culture   Setup Time 07/04/2012 02:22   Final   Culture NO GROWTH 5 DAYS   Final   Report Status 07/10/2012 FINAL   Final  URINE CULTURE     Status: None   Collection Time    07/04/12 12:17 PM      Result Value Range Status   Specimen Description URINE, CATHETERIZED   Final   Special Requests NONE   Final   Culture  Setup Time 07/05/2012 02:41   Final   Colony Count NO GROWTH   Final   Culture NO GROWTH   Final   Report Status 07/05/2012 FINAL   Final  CULTURE, RESPIRATORY (NON-EXPECTORATED)     Status: None   Collection Time    07/04/12 12:20 PM      Result Value Range Status   Specimen Description TRACHEAL ASPIRATE   Final   Special Requests NONE   Final   Gram Stain     Final   Value: RARE WBC PRESENT, PREDOMINANTLY MONONUCLEAR     RARE SQUAMOUS EPITHELIAL CELLS PRESENT     NO ORGANISMS SEEN   Culture NO GROWTH 2 DAYS   Final   Report Status 07/07/2012 FINAL   Final  CLOSTRIDIUM DIFFICILE BY PCR     Status: None   Collection Time    07/04/12  4:08 PM      Result Value Range Status   C difficile by pcr NEGATIVE  NEGATIVE Final  CULTURE, BLOOD (ROUTINE X 2)     Status: None   Collection Time    07/04/12  5:30 PM      Result Value Range Status   Specimen Description BLOOD HAND LEFT   Final   Special Requests BOTTLES DRAWN AEROBIC ONLY 5CC   Final   Culture  Setup Time 07/05/2012 03:29   Final   Culture NO GROWTH 5 DAYS   Final   Report Status 07/11/2012 FINAL   Final  CULTURE, BLOOD (ROUTINE X 2)     Status: None   Collection Time    07/04/12  8:14 PM      Result Value Range Status   Specimen Description BLOOD LEFT HAND   Final  Special Requests BOTTLES DRAWN AEROBIC ONLY 4CC   Final   Culture  Setup Time 07/05/2012 02:42   Final   Culture NO GROWTH 5 DAYS   Final   Report Status 07/11/2012 FINAL   Final    Coagulation Studies: No results found for this basename: LABPROT, INR,  in the last 72 hours  Urinalysis: No results found for this basename: COLORURINE, APPERANCEUR,  LABSPEC, PHURINE, GLUCOSEU, HGBUR, BILIRUBINUR, KETONESUR, PROTEINUR, UROBILINOGEN, NITRITE, LEUKOCYTESUR,  in the last 72 hours    Imaging: No results found.   Medications:   . sodium chloride 10 mL/hr at 07/08/12 0416   . allopurinol  100 mg Oral Daily  . aspirin  81 mg Oral Daily  . calcium acetate  667 mg Oral TID WC  . carvedilol  6.25 mg Oral Q M,W,F,Su-1800  . cefTAZidime (FORTAZ)  IV  2 g Intravenous Q M,W,F-2000  . cycloSPORINE modified  75 mg Oral BID   And  . cycloSPORINE modified  100 mg Oral BID  . darbepoetin (ARANESP) injection - DIALYSIS  150 mcg Intravenous Q Fri-HD  . famotidine  20 mg Oral Q1200  . feeding supplement  1 Container Oral BID BM  . insulin aspart  0-15 Units Subcutaneous Q4H  . insulin glargine  12 Units Subcutaneous Daily  . levothyroxine  88 mcg Oral QAC breakfast  . metoCLOPramide  5 mg Oral TID AC & HS  . predniSONE  5 mg Oral Q breakfast  . rOPINIRole  1 mg Oral QHS  . sodium chloride  3 mL Intravenous Q12H   sodium chloride, acetaminophen, acetaminophen, hydrALAZINE, HYDROmorphone (DILAUDID) injection, HYDROmorphone, labetalol, nitroGLYCERIN, ondansetron (ZOFRAN) IV, promethazine, sodium chloride, sorbitol  Assessment/ Plan:  1. Acute renal failure chronic kidney disease stage IV/V: Suspect evolution to dialysis dependent ESRD wants to dialyze at [facility]  2. Anemia: stable 3. CKD-MBD: controlled 4. Immunosuppression will need slow taper only takes cyclosporine and prednisone Patient accepted at [facility]     LOS: 11 Rojean Ige W @TODAY @11 :02 AM

## 2012-07-17 LAB — CMV (CYTOMEGALOVIRUS) DNA ULTRAQUANT, PCR: CMV DNA Quant: <200 copies/mL (ref <200)

## 2012-07-27 LAB — CULTURE, BLOOD (ROUTINE X 2)

## 2012-07-29 ENCOUNTER — Other Ambulatory Visit: Payer: Self-pay | Admitting: Internal Medicine

## 2012-07-31 ENCOUNTER — Telehealth: Payer: Self-pay | Admitting: Cardiovascular Disease

## 2012-07-31 NOTE — Telephone Encounter (Signed)
Left message to call back  

## 2012-07-31 NOTE — Telephone Encounter (Signed)
New problem   Pt has an appt on 08/02/12 @ 3:30 but pt now has to go to dialysis tues/thurs per Suanne Marker pt need to be seen by Dr [name] [facility] is there a sooner appt that can be made for the pt. Please advise pt.

## 2012-08-01 ENCOUNTER — Telehealth: Payer: Self-pay | Admitting: *Deleted

## 2012-08-01 NOTE — Telephone Encounter (Signed)
Spoke with pt. He is unable to be here for appt with Dr. [name] tomorrow due to dialysis on Tues and Thursday. Had to cancel previously scheduled appt in March.   Appt scheduled for Sep 03, 2012 at 2:15

## 2012-08-01 NOTE — Telephone Encounter (Signed)
Note opened in error.

## 2012-08-02 ENCOUNTER — Encounter: Payer: Medicare Other | Admitting: Cardiovascular Disease

## 2012-08-02 NOTE — Telephone Encounter (Signed)
Upon review of chart [place] [facility] consulted on pt while in hospital. He can see a PA or NP in office for follow up.  Dr. [name] will not be in office on Sep 03, 2012.  I have called pt and given him this information. He is at dialysis and unable to schedule appt at this time but will call us later today or tomorrow to reschedule.

## 2012-08-08 NOTE — Telephone Encounter (Signed)
Spoke with pt and appt made for him to see Richardson Dopp, PA on August 22, 2012 at 3:20.

## 2012-08-22 ENCOUNTER — Encounter: Payer: Medicare Other | Admitting: Physician Assistant

## 2012-09-03 ENCOUNTER — Encounter: Payer: Medicare Other | Admitting: Cardiovascular Disease

## 2012-09-21 ENCOUNTER — Encounter: Payer: Medicare Other | Admitting: Cardiovascular Disease

## 2012-09-27 ENCOUNTER — Ambulatory Visit (INDEPENDENT_AMBULATORY_CARE_PROVIDER_SITE_OTHER): Payer: Medicare Other | Admitting: Physician Assistant

## 2012-09-27 ENCOUNTER — Encounter: Payer: Self-pay | Admitting: Physician Assistant

## 2012-09-27 VITALS — BP 128/72 | HR 89 | Ht 68.0 in | Wt 262.0 lb

## 2012-09-27 DIAGNOSIS — R079 Chest pain, unspecified: Secondary | ICD-10-CM

## 2012-09-27 DIAGNOSIS — I10 Essential (primary) hypertension: Secondary | ICD-10-CM

## 2012-09-27 DIAGNOSIS — N186 End stage renal disease: Secondary | ICD-10-CM

## 2012-09-27 MED ORDER — METOPROLOL SUCCINATE 12.5 MG HALF TABLET: 12.5000 mg | ORAL_TABLET | Freq: Every day | ORAL | Status: DC

## 2012-09-27 NOTE — Progress Notes (Signed)
Dale [name]. [address], [address] [place], [place]  [address] Phone: [phone] Fax:  [phone]  Date:  09/27/2012   ID:  Dale [name], DOB 05-17-1965, MRN [id number]  PCP:  Windy Kalata, MD  Cardiologist:  Dr. Lauree Chandler     History of Present Illness: Dale [name] is a 47 y.o. male who returns for follow up after 2 recent hospital admissions.  He has no history of CAD. He apparently had a negative stress test in 2013 with [facility].  Past medical history significant for DM2, HTN, sleep apnea, ESRD, fibromyalgia, chronic anemia, status post left BKA. He is status post renal transplant. He was off dialysis since 10/2011.  He was seen by Dr. Lauree Chandler in 05/2012 in the hospital for chest pain. Diagnostic options were limited as cardiac catheterization would have been detrimental to his transplanted kidney. Therefore, stress testing was not thought to be helpful.  Medical therapy was recommended. Further ischemic evaluation could be pursued if he had recurrent symptoms.  Echocardiogram 06/18/12: Moderate LVH, EF 55-60%, MAC.  Patient was readmitted to the hospital 3/11-3/22 with severe septic shock secondary to Salmonella bacteremia.  He was followed by infectious disease and treated with IV antibiotics. He was intubated for some time. He developed worsening renal function and hemodialysis was resumed.  Since d/c, he is doing ok.  Notes DOE.  This is chronic and stable.  He is NYHA Class IIb-III. No orthopnea, PND.  He has occasional RLE edema.  No syncope.  He denies further CP.  He had to stop norvasc and coreg due to hypotension.  Labs (3/14):  K 3.2, Cr 4.45, Alb 2.2, ALT 21, Hgb 9.4, TSH 3.013  Wt Readings from Last 3 Encounters:  09/27/12 262 lb (118.842 kg)  07/13/12 258 lb 2.5 oz (117.1 kg)  06/18/12 269 lb 9.6 oz (122.29 kg)     Past Medical History  Diagnosis Date  . Insulin dependent diabetes mellitus   . Hypertension   . History of blood  transfusion   . ESRD (end stage renal disease)     a. 1995 s/p cadaveric transplant w/ susbequent failure after 18 yrs;  b.Dialysis initiated 07/2011  . Dyspnea     a. 07/23/11 Echo: EF 55-60%  . Anemia of chronic disease   . Cholecystitis     a. 08/27/2011  . Constipation   . Pericardial effusion     a.  Small by CT 08/22/11;  b.  Large by CT 08/27/11  . Inguinal lymphadenopathy     a. bilateral - s/p biopsy 07/2011    Current Outpatient Prescriptions  Medication Sig Dispense Refill  . allopurinol (ZYLOPRIM) 100 MG tablet Take 200 mg by mouth daily. 2 Tabs      . aspirin 81 MG tablet Take 81 mg by mouth every evening.       . calcium acetate (PHOSLO) 667 MG capsule       . cephALEXin (KEFLEX) 500 MG capsule Take 500 mg by mouth as needed.       . cycloSPORINE modified (NEORAL) 25 MG capsule Take 75 mg by mouth 2 (two) times daily. Takes with 100 mg capsules for total 175 mg BID      . cycloSPORINE modified (NEORAL/GENGRAF) 100 MG capsule Take 100 mg by mouth 2 (two) times daily. Neoral brand      . diphenhydrAMINE (BENADRYL) 25 mg capsule Take 50 mg by mouth at bedtime as needed for itching or sleep.      [name] Kitchen  docusate sodium (COLACE) 100 MG capsule Take 200 mg by mouth 3 (three) times daily.      [name] Kitchen epoetin alfa (EPOGEN,PROCRIT) 02725 UNIT/ML injection Inject 40,000 Units into the skin every 21 ( twenty-one) days. If hemoglobin is below 12      . furosemide (LASIX) 80 MG tablet Take 320 mg by mouth daily.       [name] Kitchen GLUCAGON EMERGENCY 1 MG injection Inject 1 mg into the muscle once as needed.       [name] Kitchen HYDROmorphone (DILAUDID) 2 MG tablet Take 2 mg by mouth every 4 (four) hours as needed.       . Insulin Human (INSULIN PUMP) 100 unit/ml SOLN Inject into the skin continuous. Humalog      . levothyroxine (SYNTHROID, LEVOTHROID) 88 MCG tablet Take 88 mcg by mouth daily.      [name] Kitchen loratadine (CLARITIN) 10 MG tablet Take 10 mg by mouth daily.      . metoCLOPramide (REGLAN) 5 MG tablet Take 5 mg by mouth 4  (four) times daily - after meals and at bedtime.      . nitroGLYCERIN (NITROSTAT) 0.4 MG SL tablet Place 0.4 mg under the tongue every 5 (five) minutes as needed for chest pain.       . predniSONE (DELTASONE) 5 MG tablet Take 5 mg by mouth Daily.      . promethazine (PHENERGAN) 25 MG tablet Take 25 mg by mouth Every 6 hours as needed. For nausea      . ranitidine (ZANTAC) 150 MG tablet TAKE 1 TABLET TWICE A DAY  60 tablet  3  . rOPINIRole (REQUIP) 1 MG tablet Take 2 mg by mouth Every evening. Take at 2000       No current facility-administered medications for this visit.    Allergies:    Allergies  Allergen Reactions  . Morphine And Related Hives and Nausea Only  . Amoxicillin Other (See Comments)    Reaction unknown  . Ciprofloxacin Other (See Comments)    Reaction unknown  . Clindamycin/Lincomycin Other (See Comments)    Reaction unknown  . Codeine Other (See Comments)    Reaction unknown. But pt has tolerated hydrocodone in Vicodin and Norco in the past  . Levofloxacin Other (See Comments)    Reaction unknown  . Vasotec Cough  . Oxycodone Rash    Social History:  The patient  reports that he has never smoked. He has never used smokeless tobacco. He reports that  drinks alcohol. He reports that he does not use illicit drugs.   ROS:  Please see the history of present illness.     All other systems reviewed and negative.   PHYSICAL EXAM: VS:  BP 128/72  Pulse 89  Ht 5\' 8"  (1.727 m)  Wt 262 lb (118.842 kg)  BMI 39.85 kg/m2 Well nourished, well developed, in no acute distress HEENT: normal Neck: no JVD Cardiac:  normal S1, S2; RRR; no murmur Lungs:  clear to auscultation bilaterally, no wheezing, rhonchi or rales Abd: soft, nontender, no hepatomegaly Ext: trace to 1+ RLE edema; LLE prosthesis in place Skin: warm and dry Neuro:  CNs 2-12 intact, no focal abnormalities noted  EKG:  NSR, HR 89, normal axis, no ST changes     ASSESSMENT AND PLAN:  1. Chest Pain:  No  recurrence.  He likely has underlying CAD given all his risk factors.  Will continue medical Rx for now. If he has recurrent symptoms, could consider stress testing as he  is now back on HMD.  Continue ASA.  He cannot take statins due to a hx of elevated LFTs while on statins in the past.  I will have him start low dose beta blocker (Toprol XL 12.5 mg QD).  He can hold this on dialysis days if BP is limiting.   2. Hypertension:  Controlled. 3. ESRD:  He is on MWF dialysis. 4. Disposition:  F/u with me in 3 mos.   Signed, Richardson Dopp, PA-C  09/27/2012 3:11 PM

## 2012-09-27 NOTE — Patient Instructions (Addendum)
**Note De-Identified Rehana Uncapher Obfuscation** Your physician has recommended you make the following change in your medication: start taking Toprol 12.5 mg daily  Your physician recommends that you schedule a follow-up appointment in: 3 months

## 2012-12-27 ENCOUNTER — Ambulatory Visit: Payer: Medicare Other | Admitting: Physician Assistant

## 2013-06-16 ENCOUNTER — Encounter (HOSPITAL_COMMUNITY): Payer: Self-pay | Admitting: Emergency Medicine

## 2013-06-16 ENCOUNTER — Emergency Department (HOSPITAL_COMMUNITY): Payer: Medicare Other

## 2013-06-16 ENCOUNTER — Inpatient Hospital Stay (HOSPITAL_COMMUNITY)
Admission: EM | Admit: 2013-06-16 | Discharge: 2013-06-18 | DRG: 152 | Disposition: A | Discharge status: Home / Self Care | Payer: Medicare Other | Attending: Family Medicine | Admitting: Family Medicine

## 2013-06-16 DIAGNOSIS — S88119A Complete traumatic amputation at level between knee and ankle, unspecified lower leg, initial encounter: Secondary | ICD-10-CM

## 2013-06-16 DIAGNOSIS — N179 Acute kidney failure, unspecified: Secondary | ICD-10-CM

## 2013-06-16 DIAGNOSIS — R0902 Hypoxemia: Secondary | ICD-10-CM

## 2013-06-16 DIAGNOSIS — Z9641 Presence of insulin pump (external) (internal): Secondary | ICD-10-CM

## 2013-06-16 DIAGNOSIS — R6521 Severe sepsis with septic shock: Secondary | ICD-10-CM

## 2013-06-16 DIAGNOSIS — D649 Anemia, unspecified: Secondary | ICD-10-CM

## 2013-06-16 DIAGNOSIS — N186 End stage renal disease: Secondary | ICD-10-CM

## 2013-06-16 DIAGNOSIS — E1121 Type 2 diabetes mellitus with diabetic nephropathy: Secondary | ICD-10-CM | POA: Diagnosis present

## 2013-06-16 DIAGNOSIS — J9600 Acute respiratory failure, unspecified whether with hypoxia or hypercapnia: Secondary | ICD-10-CM

## 2013-06-16 DIAGNOSIS — R0600 Dyspnea, unspecified: Secondary | ICD-10-CM

## 2013-06-16 DIAGNOSIS — R079 Chest pain, unspecified: Secondary | ICD-10-CM

## 2013-06-16 DIAGNOSIS — G4733 Obstructive sleep apnea (adult) (pediatric): Secondary | ICD-10-CM

## 2013-06-16 DIAGNOSIS — I3139 Other pericardial effusion (noninflammatory): Secondary | ICD-10-CM

## 2013-06-16 DIAGNOSIS — K5900 Constipation, unspecified: Secondary | ICD-10-CM

## 2013-06-16 DIAGNOSIS — A419 Sepsis, unspecified organism: Secondary | ICD-10-CM

## 2013-06-16 DIAGNOSIS — I509 Heart failure, unspecified: Secondary | ICD-10-CM

## 2013-06-16 DIAGNOSIS — J111 Influenza due to unidentified influenza virus with other respiratory manifestations: Principal | ICD-10-CM

## 2013-06-16 DIAGNOSIS — Z89519 Acquired absence of unspecified leg below knee: Secondary | ICD-10-CM

## 2013-06-16 DIAGNOSIS — E872 Acidosis, unspecified: Secondary | ICD-10-CM

## 2013-06-16 DIAGNOSIS — N2581 Secondary hyperparathyroidism of renal origin: Secondary | ICD-10-CM | POA: Diagnosis present

## 2013-06-16 DIAGNOSIS — R110 Nausea: Secondary | ICD-10-CM

## 2013-06-16 DIAGNOSIS — K3184 Gastroparesis: Secondary | ICD-10-CM | POA: Diagnosis present

## 2013-06-16 DIAGNOSIS — Z992 Dependence on renal dialysis: Secondary | ICD-10-CM

## 2013-06-16 DIAGNOSIS — R509 Fever, unspecified: Secondary | ICD-10-CM

## 2013-06-16 DIAGNOSIS — J40 Bronchitis, not specified as acute or chronic: Secondary | ICD-10-CM

## 2013-06-16 DIAGNOSIS — Z79899 Other long term (current) drug therapy: Secondary | ICD-10-CM

## 2013-06-16 DIAGNOSIS — D72829 Elevated white blood cell count, unspecified: Secondary | ICD-10-CM

## 2013-06-16 DIAGNOSIS — I313 Pericardial effusion (noninflammatory): Secondary | ICD-10-CM

## 2013-06-16 DIAGNOSIS — I120 Hypertensive chronic kidney disease with stage 5 chronic kidney disease or end stage renal disease: Secondary | ICD-10-CM | POA: Diagnosis present

## 2013-06-16 DIAGNOSIS — K559 Vascular disorder of intestine, unspecified: Secondary | ICD-10-CM

## 2013-06-16 DIAGNOSIS — M7989 Other specified soft tissue disorders: Secondary | ICD-10-CM

## 2013-06-16 DIAGNOSIS — E039 Hypothyroidism, unspecified: Secondary | ICD-10-CM | POA: Diagnosis present

## 2013-06-16 DIAGNOSIS — Z7982 Long term (current) use of aspirin: Secondary | ICD-10-CM

## 2013-06-16 DIAGNOSIS — R197 Diarrhea, unspecified: Secondary | ICD-10-CM

## 2013-06-16 DIAGNOSIS — J9601 Acute respiratory failure with hypoxia: Secondary | ICD-10-CM

## 2013-06-16 DIAGNOSIS — R7881 Bacteremia: Secondary | ICD-10-CM

## 2013-06-16 DIAGNOSIS — A020 Salmonella enteritis: Secondary | ICD-10-CM

## 2013-06-16 DIAGNOSIS — Z940 Kidney transplant status: Secondary | ICD-10-CM

## 2013-06-16 DIAGNOSIS — E119 Type 2 diabetes mellitus without complications: Secondary | ICD-10-CM

## 2013-06-16 HISTORY — DX: Fibromyalgia: M79.7

## 2013-06-16 LAB — CBC WITH DIFFERENTIAL/PLATELET
Basophils Absolute: 0 10*3/uL (ref 0.0–0.1)
Basophils Relative: 0 % (ref 0–1)
Eosinophils Absolute: 0.2 10*3/uL (ref 0.0–0.7)
Eosinophils Relative: 2 % (ref 0–5)
HCT: 34.1 % — ABNORMAL LOW (ref 39.0–52.0)
Hemoglobin: 11.4 g/dL — ABNORMAL LOW (ref 13.0–17.0)
Lymphocytes Relative: 9 % — ABNORMAL LOW (ref 12–46)
Lymphs Abs: 0.8 10*3/uL (ref 0.7–4.0)
MCH: 34.4 pg — ABNORMAL HIGH (ref 26.0–34.0)
MCHC: 33.4 g/dL (ref 30.0–36.0)
MCV: 103 fL — ABNORMAL HIGH (ref 78.0–100.0)
Monocytes Absolute: 0.6 10*3/uL (ref 0.1–1.0)
Monocytes Relative: 7 % (ref 3–12)
Neutro Abs: 7.3 10*3/uL (ref 1.7–7.7)
Neutrophils Relative %: 81 % — ABNORMAL HIGH (ref 43–77)
Platelets: 210 10*3/uL (ref 150–400)
RBC: 3.31 MIL/uL — ABNORMAL LOW (ref 4.22–5.81)
RDW: 13.9 % (ref 11.5–15.5)
WBC: 9 10*3/uL (ref 4.0–10.5)

## 2013-06-16 LAB — I-STAT CG4 LACTIC ACID, ED: Lactic Acid, Venous: 0.87 mmol/L (ref 0.5–2.2)

## 2013-06-16 LAB — URINALYSIS, ROUTINE W REFLEX MICROSCOPIC
Bilirubin Urine: NEGATIVE
Glucose, UA: NEGATIVE mg/dL
Ketones, ur: NEGATIVE mg/dL
Leukocytes, UA: NEGATIVE
Nitrite: NEGATIVE
Protein, ur: 100 mg/dL — AB
Specific Gravity, Urine: 1.018 (ref 1.005–1.030)
Urobilinogen, UA: 0.2 mg/dL (ref 0.0–1.0)
pH: 5.5 (ref 5.0–8.0)

## 2013-06-16 LAB — COMPREHENSIVE METABOLIC PANEL
ALT: 17 U/L (ref 0–53)
AST: 22 U/L (ref 0–37)
Albumin: 3.1 g/dL — ABNORMAL LOW (ref 3.5–5.2)
Alkaline Phosphatase: 118 U/L — ABNORMAL HIGH (ref 39–117)
BUN: 47 mg/dL — ABNORMAL HIGH (ref 6–23)
CO2: 25 mEq/L (ref 19–32)
Calcium: 9.4 mg/dL (ref 8.4–10.5)
Chloride: 91 mEq/L — ABNORMAL LOW (ref 96–112)
Creatinine, Ser: 6.62 mg/dL — ABNORMAL HIGH (ref 0.50–1.35)
GFR calc Af Amer: 10 mL/min — ABNORMAL LOW (ref >90)
GFR calc non Af Amer: 9 mL/min — ABNORMAL LOW (ref >90)
Glucose, Bld: 178 mg/dL — ABNORMAL HIGH (ref 70–99)
Potassium: 4.6 mEq/L (ref 3.7–5.3)
Sodium: 133 mEq/L — ABNORMAL LOW (ref 137–147)
Total Bilirubin: 0.3 mg/dL (ref 0.3–1.2)
Total Protein: 8.5 g/dL — ABNORMAL HIGH (ref 6.0–8.3)

## 2013-06-16 LAB — I-STAT ARTERIAL BLOOD GAS, ED
Acid-base deficit: 2 mmol/L (ref 0.0–2.0)
Bicarbonate: 22.5 mEq/L (ref 20.0–24.0)
O2 Saturation: 96 %
Patient temperature: 98.9
TCO2: 24 mmol/L (ref 0–100)
pCO2 arterial: 37.2 mmHg (ref 35.0–45.0)
pH, Arterial: 7.389 (ref 7.350–7.450)
pO2, Arterial: 84 mmHg (ref 80.0–100.0)

## 2013-06-16 LAB — URINE MICROSCOPIC-ADD ON

## 2013-06-16 MED ORDER — ONDANSETRON HCL 4 MG PO TABS
4.0000 mg | ORAL_TABLET | Freq: Once | ORAL | Status: AC
  Administered 2013-06-16: 4 mg via ORAL
  Filled 2013-06-16: qty 1

## 2013-06-16 MED ORDER — PREDNISONE 2.5 MG PO TABS
2.5000 mg | ORAL_TABLET | ORAL | Status: DC
  Administered 2013-06-17: 2.5 mg via ORAL
  Filled 2013-06-16: qty 1

## 2013-06-16 MED ORDER — FUROSEMIDE 80 MG PO TABS
160.0000 mg | ORAL_TABLET | Freq: Two times a day (BID) | ORAL | Status: DC
  Administered 2013-06-17 – 2013-06-18 (×3): 160 mg via ORAL
  Filled 2013-06-16 (×5): qty 2

## 2013-06-16 MED ORDER — GUAIFENESIN 100 MG/5ML PO LIQD: 100.0000 mg | ORAL | Status: DC | PRN

## 2013-06-16 MED ORDER — LEVOTHYROXINE SODIUM 88 MCG PO TABS
88.0000 ug | ORAL_TABLET | Freq: Every day | ORAL | Status: DC
  Administered 2013-06-17 – 2013-06-18 (×2): 88 ug via ORAL
  Filled 2013-06-16 (×4): qty 1

## 2013-06-16 MED ORDER — IPRATROPIUM BROMIDE 0.02 % IN SOLN: 0.5000 mg | Freq: Four times a day (QID) | RESPIRATORY_TRACT | Status: DC

## 2013-06-16 MED ORDER — HYDROMORPHONE HCL 2 MG PO TABS
2.0000 mg | ORAL_TABLET | Freq: Once | ORAL | Status: AC
  Administered 2013-06-16: 2 mg via ORAL
  Filled 2013-06-16: qty 1

## 2013-06-16 MED ORDER — ALBUTEROL SULFATE (2.5 MG/3ML) 0.083% IN NEBU
5.0000 mg | INHALATION_SOLUTION | Freq: Once | RESPIRATORY_TRACT | Status: AC
  Administered 2013-06-16: 5 mg via RESPIRATORY_TRACT
  Filled 2013-06-16: qty 6

## 2013-06-16 MED ORDER — DOXYCYCLINE HYCLATE 100 MG IV SOLR
100.0000 mg | Freq: Two times a day (BID) | INTRAVENOUS | Status: DC
  Administered 2013-06-17 – 2013-06-18 (×2): 100 mg via INTRAVENOUS
  Filled 2013-06-16 (×4): qty 100

## 2013-06-16 MED ORDER — ALLOPURINOL 100 MG PO TABS
200.0000 mg | ORAL_TABLET | Freq: Every day | ORAL | Status: DC
  Administered 2013-06-17 – 2013-06-18 (×2): 200 mg via ORAL
  Filled 2013-06-16 (×2): qty 2

## 2013-06-16 MED ORDER — BENZONATATE 100 MG PO CAPS: 100.0000 mg | ORAL_CAPSULE | Freq: Three times a day (TID) | ORAL | Status: DC

## 2013-06-16 MED ORDER — SODIUM CHLORIDE 0.9 % IJ SOLN
3.0000 mL | Freq: Two times a day (BID) | INTRAMUSCULAR | Status: DC
  Administered 2013-06-17 – 2013-06-18 (×2): 3 mL via INTRAVENOUS

## 2013-06-16 MED ORDER — FAMOTIDINE 20 MG PO TABS
20.0000 mg | ORAL_TABLET | Freq: Every day | ORAL | Status: DC
  Administered 2013-06-17 – 2013-06-18 (×2): 20 mg via ORAL
  Filled 2013-06-16 (×2): qty 1

## 2013-06-16 MED ORDER — ASPIRIN 81 MG PO TABS: 81.0000 mg | ORAL_TABLET | Freq: Every evening | ORAL | Status: DC

## 2013-06-16 MED ORDER — DOXYCYCLINE HYCLATE 100 MG PO TABS
100.0000 mg | ORAL_TABLET | Freq: Two times a day (BID) | ORAL | Status: DC
  Administered 2013-06-16: 100 mg via ORAL
  Filled 2013-06-16: qty 1

## 2013-06-16 MED ORDER — ACETAMINOPHEN 650 MG RE SUPP: 650.0000 mg | Freq: Four times a day (QID) | RECTAL | Status: DC | PRN

## 2013-06-16 MED ORDER — GUAIFENESIN ER 600 MG PO TB12
600.0000 mg | ORAL_TABLET | Freq: Two times a day (BID) | ORAL | Status: DC
  Administered 2013-06-17 – 2013-06-18 (×3): 600 mg via ORAL
  Filled 2013-06-16 (×4): qty 1

## 2013-06-16 MED ORDER — BENZONATATE 100 MG PO CAPS
100.0000 mg | ORAL_CAPSULE | Freq: Once | ORAL | Status: AC
  Administered 2013-06-16: 100 mg via ORAL
  Filled 2013-06-16: qty 1

## 2013-06-16 MED ORDER — LORATADINE 10 MG PO TABS
10.0000 mg | ORAL_TABLET | Freq: Every day | ORAL | Status: DC
  Administered 2013-06-17 – 2013-06-18 (×2): 10 mg via ORAL
  Filled 2013-06-16 (×2): qty 1

## 2013-06-16 MED ORDER — CALCIUM ACETATE 667 MG PO CAPS
667.0000 mg | ORAL_CAPSULE | Freq: Three times a day (TID) | ORAL | Status: DC
  Administered 2013-06-17 (×2): 667 mg via ORAL
  Filled 2013-06-16 (×4): qty 1

## 2013-06-16 MED ORDER — METOCLOPRAMIDE HCL 5 MG PO TABS
5.0000 mg | ORAL_TABLET | Freq: Three times a day (TID) | ORAL | Status: DC
  Administered 2013-06-17 – 2013-06-18 (×6): 5 mg via ORAL
  Filled 2013-06-16 (×9): qty 1

## 2013-06-16 MED ORDER — HYDROMORPHONE HCL 2 MG PO TABS: 2.0000 mg | ORAL_TABLET | Freq: Three times a day (TID) | ORAL | Status: DC | PRN

## 2013-06-16 MED ORDER — ALBUTEROL SULFATE (2.5 MG/3ML) 0.083% IN NEBU: 2.5000 mg | INHALATION_SOLUTION | Freq: Four times a day (QID) | RESPIRATORY_TRACT | Status: DC

## 2013-06-16 MED ORDER — CYCLOSPORINE MODIFIED (NEORAL) 100 MG PO CAPS
100.0000 mg | ORAL_CAPSULE | Freq: Every day | ORAL | Status: DC
  Administered 2013-06-17 – 2013-06-18 (×2): 100 mg via ORAL
  Filled 2013-06-16 (×2): qty 1

## 2013-06-16 MED ORDER — ONDANSETRON HCL 4 MG PO TABS: 4.0000 mg | ORAL_TABLET | Freq: Four times a day (QID) | ORAL | Status: DC | PRN

## 2013-06-16 MED ORDER — BENZONATATE 100 MG PO CAPS
100.0000 mg | ORAL_CAPSULE | Freq: Three times a day (TID) | ORAL | Status: DC | PRN
  Administered 2013-06-17 – 2013-06-18 (×3): 100 mg via ORAL
  Filled 2013-06-16 (×3): qty 1

## 2013-06-16 MED ORDER — ONDANSETRON HCL 4 MG/2ML IJ SOLN: 4.0000 mg | Freq: Four times a day (QID) | INTRAMUSCULAR | Status: DC | PRN

## 2013-06-16 MED ORDER — INSULIN PUMP
SUBCUTANEOUS | Status: DC
  Filled 2013-06-16: qty 1

## 2013-06-16 MED ORDER — ROPINIROLE HCL 1 MG PO TABS
2.0000 mg | ORAL_TABLET | Freq: Every day | ORAL | Status: DC
Start: 2013-06-17 — End: 2013-06-18
  Administered 2013-06-17: 2 mg via ORAL
  Filled 2013-06-16 (×3): qty 2

## 2013-06-16 MED ORDER — ACETAMINOPHEN 325 MG PO TABS
650.0000 mg | ORAL_TABLET | Freq: Four times a day (QID) | ORAL | Status: DC | PRN
  Administered 2013-06-18: 650 mg via ORAL
  Filled 2013-06-16: qty 2

## 2013-06-16 MED ORDER — DOCUSATE SODIUM 100 MG PO CAPS
200.0000 mg | ORAL_CAPSULE | Freq: Three times a day (TID) | ORAL | Status: DC
  Administered 2013-06-17 – 2013-06-18 (×4): 200 mg via ORAL
  Filled 2013-06-16 (×6): qty 2

## 2013-06-16 MED ORDER — HEPARIN SODIUM (PORCINE) 5000 UNIT/ML IJ SOLN
5000.0000 [IU] | Freq: Three times a day (TID) | INTRAMUSCULAR | Status: DC
  Administered 2013-06-17 – 2013-06-18 (×3): 5000 [IU] via SUBCUTANEOUS
  Filled 2013-06-16 (×7): qty 1

## 2013-06-16 MED ORDER — ATORVASTATIN CALCIUM 10 MG PO TABS
10.0000 mg | ORAL_TABLET | Freq: Every day | ORAL | Status: DC
  Administered 2013-06-17 – 2013-06-18 (×2): 10 mg via ORAL
  Filled 2013-06-16 (×2): qty 1

## 2013-06-16 MED ORDER — IPRATROPIUM BROMIDE 0.02 % IN SOLN
0.5000 mg | Freq: Once | RESPIRATORY_TRACT | Status: AC
  Administered 2013-06-16: 0.5 mg via RESPIRATORY_TRACT
  Filled 2013-06-16: qty 2.5

## 2013-06-16 MED ORDER — NITROGLYCERIN 0.4 MG SL SUBL: 0.4000 mg | SUBLINGUAL_TABLET | SUBLINGUAL | Status: DC | PRN

## 2013-06-16 NOTE — H&P (Signed)
[facility] History and Physical  Patient: Dale [name]  [id number]  DOB: [birth year]  DOS: the patient was seen and examined on 06/16/2013 PCP: Windy Kalata, MD  Chief Complaint: Cough and shortness of breath  HPI: Dale [name] is a 48 y.o. male with Past medical history of ESRD on hemodialysis, renal transplant, diabetes mellitus on insulin pump, hypertension. The patient is coming from home. The patient is presenting with complaints of cough and shortness of breath that has been ongoing since last Tuesday. His cough is clear, without any blood, progressively worsening, associated with shortness of breath, wheezing, low-grade fever and night sweats. He denies any sick contact or recent travel or immobilization. He has pleuritic type of chest pain substernally gets worsen with breathing cough and on palpation. He has history of ESRD on hemodialysis Monday Wednesday Friday, dry weight at his baseline 128 kg. Denies any significant weight gain. He also had some nausea and dry heaves but denies any abdominal pain, constipation, diarrhea, burning urination, flank pain. He is compliant with his medications, 3 weeks ago his cyclosporin was changed from 100 twice a day 200 daily and prednisone was changed from 5 daily to 2.5 every other day.  Review of Systems: as mentioned in the history of present illness.  A Comprehensive review of the other systems is negative.  Past Medical History  Diagnosis Date  . Insulin dependent diabetes mellitus   . Hypertension   . History of blood transfusion   . ESRD (end stage renal disease)     a. 1995 s/p cadaveric transplant w/ susbequent failure after 18 yrs;  b.Dialysis initiated 07/2011  . Dyspnea     a. 07/23/11 Echo: EF 55-60%  . Anemia of chronic disease   . Cholecystitis     a. 08/27/2011  . Constipation   . Pericardial effusion     a.  Small by CT 08/22/11;  b.  Large by CT 08/27/11  . Inguinal lymphadenopathy     a. bilateral - s/p  biopsy 07/2011  . Fibromyalgia    Past Surgical History  Procedure Laterality Date  . Appendectomy    . Hernia repair    . Kidney transplant    . Cataract extraction    . Arteriovenous graft placement    . Av fistula placement    . Toe amputation    . Below knee leg amputation    . Carpal tunnel release    . Shoulder surgery    . Av fistula placement  07/29/2011    Procedure: ARTERIOVENOUS (AV) FISTULA CREATION;  Surgeon: Mal Misty, MD;  Location: [place];  Service: Vascular;  Laterality: Right;  Brachial cephalic  . Insertion of dialysis catheter  08/01/2011    Procedure: INSERTION OF DIALYSIS CATHETER;  Surgeon: Rosetta Posner, MD;  Location: [place];  Service: Vascular;  Laterality: Right;  insertion of dialysis catheter on right internal jugular vein  . Lymph node biopsy  08/10/2011    Procedure: LYMPH NODE BIOPSY;  Surgeon: Merrie Roof, MD;  Location: [place];  Service: General;  Laterality: Left;  left groin lymph biopsy  . Colonoscopy  08/29/2011    Procedure: COLONOSCOPY;  Surgeon: Jerene Bears, MD;  Location: [place];  Service: Gastroenterology;  Laterality: N/A;   Social History:  reports that he has never smoked. He has never used smokeless tobacco. He reports that he drinks alcohol. He reports that he does not use illicit drugs. Independent for most of his  ADL.  Allergies  Allergen Reactions  . Morphine And Related Hives and Nausea Only  . Amoxicillin Other (See Comments)    Reaction unknown  . Ciprofloxacin Other (See Comments)    Reaction unknown  . Clindamycin/Lincomycin Other (See Comments)    Reaction unknown  . Codeine Other (See Comments)    Reaction unknown. But pt has tolerated hydrocodone in Vicodin and Norco in the past  . Levofloxacin Other (See Comments)    Reaction unknown  . Vasotec Cough  . Oxycodone Rash    Family History  Problem Relation Age of Onset  . Malignant hyperthermia Neg Hx   . Colon polyps Father   . Diabetes Father   . Cancer  Father   . Breast cancer Maternal Aunt   . Cancer Maternal Aunt     Breast and Bone    Prior to Admission medications   Medication Sig Start Date End Date Taking? Authorizing Provider  allopurinol (ZYLOPRIM) 100 MG tablet Take 200 mg by mouth daily. 2 Tabs 03/14/12  Yes Historical Provider, MD  aspirin 81 MG tablet Take 81 mg by mouth every evening.    Yes Historical Provider, MD  atorvastatin (LIPITOR) 10 MG tablet Take 10 mg by mouth daily.   Yes Historical Provider, MD  calcium acetate (PHOSLO) 667 MG capsule Take 667 mg by mouth 3 (three) times daily with meals.  09/19/12  Yes Historical Provider, MD  cycloSPORINE modified (NEORAL/GENGRAF) 100 MG capsule Take 100 mg by mouth daily. Neoral brand 06/26/11  Yes Historical Provider, MD  diphenhydrAMINE (BENADRYL) 25 mg capsule Take 50 mg by mouth at bedtime as needed for itching or sleep.   Yes Historical Provider, MD  docusate sodium (COLACE) 100 MG capsule Take 200 mg by mouth 3 (three) times daily.   Yes Historical Provider, MD  epoetin alfa (EPOGEN,PROCRIT) 09811 UNIT/ML injection Inject 40,000 Units into the skin every 21 ( twenty-one) days. If hemoglobin is below 12   Yes Historical Provider, MD  furosemide (LASIX) 80 MG tablet Take 160 mg by mouth 2 (two) times daily.  09/03/12  Yes Historical Provider, MD  GLUCAGON EMERGENCY 1 MG injection Inject 1 mg into the muscle once as needed.  01/19/12  Yes Historical Provider, MD  HYDROmorphone (DILAUDID) 2 MG tablet Take 2 mg by mouth 3 (three) times daily as needed for severe pain.  09/03/12  Yes Historical Provider, MD  Insulin Human (INSULIN PUMP) 100 unit/ml SOLN Inject into the skin continuous. Humalog   Yes Historical Provider, MD  levothyroxine (SYNTHROID, LEVOTHROID) 88 MCG tablet Take 88 mcg by mouth daily.   Yes Historical Provider, MD  loratadine (CLARITIN) 10 MG tablet Take 10 mg by mouth daily.   Yes Historical Provider, MD  metoCLOPramide (REGLAN) 5 MG tablet Take 5 mg by mouth 4 (four)  times daily - after meals and at bedtime. 05/26/11  Yes Historical Provider, MD  predniSONE (DELTASONE) 5 MG tablet Take 2.5 mg by mouth every other day.  06/26/11  Yes Historical Provider, MD  promethazine (PHENERGAN) 25 MG tablet Take 25 mg by mouth Every 6 hours as needed. For nausea 07/13/11  Yes Historical Provider, MD  ranitidine (ZANTAC) 150 MG tablet TAKE 1 TABLET TWICE A DAY 07/29/12  Yes Jerene Bears, MD  rOPINIRole (REQUIP) 1 MG tablet Take 2 mg by mouth Every evening. Take at 2000 07/18/11  Yes Historical Provider, MD  benzonatate (TESSALON) 100 MG capsule Take 1 capsule (100 mg total) by mouth every 8 (eight) hours. 06/16/13  Domenic Moras, PA-C  guaiFENesin (ROBITUSSIN) 100 MG/5ML liquid Take 5-10 mLs (100-200 mg total) by mouth every 4 (four) hours as needed for cough. 06/16/13   Domenic Moras, PA-C  nitroGLYCERIN (NITROSTAT) 0.4 MG SL tablet Place 0.4 mg under the tongue every 5 (five) minutes as needed for chest pain.  09/06/11 09/27/12  [name], MD    Physical Exam: Filed Vitals:   06/16/13 1900 06/16/13 1930 06/16/13 1939 06/16/13 2019  BP: 133/54 132/46    Pulse: 90 92    Temp:      TempSrc:      Resp:  21    SpO2: 100% 97% 99% 98%    General: Alert, Awake and Oriented to Time, Place and Person. Appear in mild distress Eyes: PERRL ENT: Oral Mucosa clear moist. Neck: No JVD Cardiovascular: S1 and S2 Present, no Murmur, Peripheral Pulses Present Respiratory: Bilateral Air entry equal and Decreased, no Crackles, bilateral expiratory wheezes Abdomen: Bowel Sound Present, Soft and Non tender Skin: No Rash Extremities: No Pedal edema, no calf tenderness, left leg BKA Neurologic: Grossly Unremarkable.  Labs on Admission:  CBC:  Recent Labs Lab 06/16/13 1619  WBC 9.0  NEUTROABS 7.3  HGB 11.4*  HCT 34.1*  MCV 103.0*  PLT 210    CMP     Component Value Date/Time   NA 133* 06/16/2013 1619   K 4.6 06/16/2013 1619   CL 91* 06/16/2013 1619   CO2 25 06/16/2013 1619    GLUCOSE 178* 06/16/2013 1619   BUN 47* 06/16/2013 1619   CREATININE 6.62* 06/16/2013 1619   CALCIUM 9.4 06/16/2013 1619   PROT 8.5* 06/16/2013 1619   ALBUMIN 3.1* 06/16/2013 1619   AST 22 06/16/2013 1619   ALT 17 06/16/2013 1619   ALKPHOS 118* 06/16/2013 1619   BILITOT 0.3 06/16/2013 1619   GFRNONAA 9* 06/16/2013 1619   GFRAA 10* 06/16/2013 1619    No results found for this basename: LIPASE, AMYLASE,  in the last 168 hours No results found for this basename: AMMONIA,  in the last 168 hours  No results found for this basename: CKTOTAL, CKMB, CKMBINDEX, TROPONINI,  in the last 168 hours BNP (last 3 results) No results found for this basename: PROBNP,  in the last 8760 hours  Radiological Exams on Admission: Dg Chest 2 View  06/16/2013   CLINICAL DATA:  Cough and chest congestion and fever.  Nausea.  EXAM: CHEST  2 VIEW  COMPARISON:  07/06/2012 and 07/03/2012  FINDINGS: There is chronic prominence of the superior mediastinum, probably due to mediastinal fat. Pulmonary vascularity is at the upper limits of normal. No consolidative infiltrates or effusions. No acute osseous abnormality.  IMPRESSION: Slight pulmonary vascular prominence.  No acute infiltrates.   Electronically Signed   By: Rozetta Nunnery M.D.   On: 06/16/2013 18:07    EKG: Independently reviewed. normal EKG, normal sinus rhythm, nonspecific ST and T waves changes.  Assessment/Plan Principal Problem:   Acute respiratory failure with hypoxia Active Problems:   History of renal transplant   S/P BKA (below knee amputation)   Diabetes mellitus   Tracheobronchitis   1. Acute respiratory failure with hypoxia The patient is presenting with complaints of cough and shortness of breath, he was on room air saturating well at rest but on exertion becoming hypoxic in 80s. At present he is on 3 L of oxygen, recheck ABG. Etiology is acute tracheobronchitis probably viral. I will check influenza PCR, start him on Tamiflu, duo nebs, oxygen as  needed, urine antigens, sputum culture, doxycycline twice a day, For cough Tessalon Perles and Mucinex  2. History of renal transplant Continue prednisone 2.5 every other day, cyclosporin Does not appear in shock, may not need stress dose steroid  3. Diabetes mellitus Continue insulin pump  4. Hypothyroidism Continue Synthroid  5. Nausea. Does not appear to have any acute abdomen. Continue Reglan for his gastroparesis and Zofran as needed.  6. ESRD Hemodialysis, nephrology consulted.  Consults: Nephrology for hemodialysis  DVT Prophylaxis: subcutaneous Heparin Nutrition: Renal diet: Diabetic  Code Status: Full  Family Communication: Wife was present at bedside, opportunity was given to ask question and all questions were answered satisfactorily at the time of interview. Disposition: Admitted to inpatient in telemetry unit.  Author: Berle Mull, MD [facility] Pager: [phone] 06/16/2013, 8:22 PM    If 7PM-7AM, please contact night-coverage [email or web address] Password [email or web address]

## 2013-06-16 NOTE — ED Notes (Signed)
I stat Lactic acid results given to Domenic Moras PA

## 2013-06-16 NOTE — Discharge Instructions (Signed)
Viral Infections °A viral infection can be caused by different types of viruses. Most viral infections are not serious and resolve on their own. However, some infections may cause severe symptoms and may lead to further complications. °SYMPTOMS °Viruses can frequently cause: °· Minor sore throat. °· Aches and pains. °· Headaches. °· Runny nose. °· Different types of rashes. °· Watery eyes. °· Tiredness. °· Cough. °· Loss of appetite. °· Gastrointestinal infections, resulting in nausea, vomiting, and diarrhea. °These symptoms do not respond to antibiotics because the infection is not caused by bacteria. However, you might catch a bacterial infection following the viral infection. This is sometimes called a "superinfection." Symptoms of such a bacterial infection may include: °· Worsening sore throat with pus and difficulty swallowing. °· Swollen neck glands. °· Chills and a high or persistent fever. °· Severe headache. °· Tenderness over the sinuses. °· Persistent overall ill feeling (malaise), muscle aches, and tiredness (fatigue). °· Persistent cough. °· Yellow, green, or brown mucus production with coughing. °HOME CARE INSTRUCTIONS  °· Only take over-the-counter or prescription medicines for pain, discomfort, diarrhea, or fever as directed by your caregiver. °· Drink enough water and fluids to keep your urine clear or pale yellow. Sports drinks can provide valuable electrolytes, sugars, and hydration. °· Get plenty of rest and maintain proper nutrition. Soups and broths with crackers or rice are fine. °SEEK IMMEDIATE MEDICAL CARE IF:  °· You have severe headaches, shortness of breath, chest pain, neck pain, or an unusual rash. °· You have uncontrolled vomiting, diarrhea, or you are unable to keep down fluids. °· You or your child has an oral temperature above 102° F (38.9° C), not controlled by medicine. °· Your baby is older than 3 months with a rectal temperature of 102° F (38.9° C) or higher. °· Your baby is 3  months old or younger with a rectal temperature of 100.4° F (38° C) or higher. °MAKE SURE YOU:  °· Understand these instructions. °· Will watch your condition. °· Will get help right away if you are not doing well or get worse. °Document Released: 01/19/2005 Document Revised: 07/04/2011 Document Reviewed: 08/16/2010 °ExitCare® Patient Information ©2014 [employer]. ° °

## 2013-06-16 NOTE — ED Notes (Signed)
Per pt sts a few days of cough, fever, congestion, nausea and loss of appetite. Pt dialysis pt.

## 2013-06-16 NOTE — ED Provider Notes (Signed)
CSN: [id number]     Arrival date & time 06/16/13  1528 History   First MD Initiated Contact with Patient 06/16/13 1545     Chief Complaint  Patient presents with  . Nausea  . Fever  . Cough     (Consider location/radiation/quality/duration/timing/severity/associated sxs/prior Treatment) HPI  48 year old male with history of insulin-dependent diabetes, hypertension, end-stage renal disease currently on dialysis presents with cough.  Patient reports for the past 5 days he has had fever, chills, cough productive with green sputum, feeling nauseous, dry heave, decrease in appetite, body aches and generally not feeling well. He has been taking DayQuil and NyQuil with minimal relief. He has a fever as high as 101.4 at home. He is a Monday Wednesday Friday dialysis patient has not missed any dialysis. Reports sick contact at dialysis center. Denies headache, neck stiffness, shortness of breath, hemoptysis, abdominal pain, back pain. Patient makes some urine.  Past Medical History  Diagnosis Date  . Insulin dependent diabetes mellitus   . Hypertension   . History of blood transfusion   . ESRD (end stage renal disease)     a. 1995 s/p cadaveric transplant w/ susbequent failure after 18 yrs;  b.Dialysis initiated 07/2011  . Dyspnea     a. 07/23/11 Echo: EF 55-60%  . Anemia of chronic disease   . Cholecystitis     a. 08/27/2011  . Constipation   . Pericardial effusion     a.  Small by CT 08/22/11;  b.  Large by CT 08/27/11  . Inguinal lymphadenopathy     a. bilateral - s/p biopsy 07/2011   Past Surgical History  Procedure Laterality Date  . Appendectomy    . Hernia repair    . Kidney transplant    . Cataract extraction    . Arteriovenous graft placement    . Av fistula placement    . Toe amputation    . Below knee leg amputation    . Carpal tunnel release    . Shoulder surgery    . Av fistula placement  07/29/2011    Procedure: ARTERIOVENOUS (AV) FISTULA CREATION;  Surgeon: Mal Misty,  MD;  Location: [place];  Service: Vascular;  Laterality: Right;  Brachial cephalic  . Insertion of dialysis catheter  08/01/2011    Procedure: INSERTION OF DIALYSIS CATHETER;  Surgeon: Rosetta Posner, MD;  Location: [place];  Service: Vascular;  Laterality: Right;  insertion of dialysis catheter on right internal jugular vein  . Lymph node biopsy  08/10/2011    Procedure: LYMPH NODE BIOPSY;  Surgeon: Merrie Roof, MD;  Location: [place];  Service: General;  Laterality: Left;  left groin lymph biopsy  . Colonoscopy  08/29/2011    Procedure: COLONOSCOPY;  Surgeon: Jerene Bears, MD;  Location: [place];  Service: Gastroenterology;  Laterality: N/A;   Family History  Problem Relation Age of Onset  . Malignant hyperthermia Neg Hx   . Colon polyps Father   . Diabetes Father   . Cancer Father   . Breast cancer Maternal Aunt   . Cancer Maternal Aunt     Breast and Bone   History  Substance Use Topics  . Smoking status: Never Smoker   . Smokeless tobacco: Never Used  . Alcohol Use: Yes     Comment: occasionally 1xmonth    Review of Systems  All other systems reviewed and are negative.      Allergies  Morphine and related; Amoxicillin; Ciprofloxacin; Clindamycin/lincomycin; Codeine; Levofloxacin;  Vasotec; and Oxycodone  Home Medications   Current Outpatient Rx  Name  Route  Sig  Dispense  Refill  . allopurinol (ZYLOPRIM) 100 MG tablet   Oral   Take 200 mg by mouth daily. 2 Tabs         . aspirin 81 MG tablet   Oral   Take 81 mg by mouth every evening.          . calcium acetate (PHOSLO) 667 MG capsule               . cephALEXin (KEFLEX) 500 MG capsule   Oral   Take 500 mg by mouth as needed.          . cycloSPORINE modified (NEORAL) 25 MG capsule   Oral   Take 75 mg by mouth 2 (two) times daily. Takes with 100 mg capsules for total 175 mg BID         . cycloSPORINE modified (NEORAL/GENGRAF) 100 MG capsule   Oral   Take 100 mg by mouth 2 (two) times daily.  Neoral brand         . diphenhydrAMINE (BENADRYL) 25 mg capsule   Oral   Take 50 mg by mouth at bedtime as needed for itching or sleep.         [name] Kitchen docusate sodium (COLACE) 100 MG capsule   Oral   Take 200 mg by mouth 3 (three) times daily.         [name] Kitchen epoetin alfa (EPOGEN,PROCRIT) 91478 UNIT/ML injection   Subcutaneous   Inject 40,000 Units into the skin every 21 ( twenty-one) days. If hemoglobin is below 12         . furosemide (LASIX) 80 MG tablet   Oral   Take 320 mg by mouth daily.          [name] Kitchen GLUCAGON EMERGENCY 1 MG injection   Intramuscular   Inject 1 mg into the muscle once as needed.          [name] Kitchen HYDROmorphone (DILAUDID) 2 MG tablet   Oral   Take 2 mg by mouth every 4 (four) hours as needed.          . Insulin Human (INSULIN PUMP) 100 unit/ml SOLN   Subcutaneous   Inject into the skin continuous. Humalog         . levothyroxine (SYNTHROID, LEVOTHROID) 88 MCG tablet   Oral   Take 88 mcg by mouth daily.         [name] Kitchen loratadine (CLARITIN) 10 MG tablet   Oral   Take 10 mg by mouth daily.         . metoCLOPramide (REGLAN) 5 MG tablet   Oral   Take 5 mg by mouth 4 (four) times daily - after meals and at bedtime.         . metoprolol succinate (TOPROL-XL) 12.5 mg TB24   Oral   Take 0.5 tablets (12.5 mg total) by mouth daily.   15 tablet   3   . EXPIRED: nitroGLYCERIN (NITROSTAT) 0.4 MG SL tablet   Sublingual   Place 0.4 mg under the tongue every 5 (five) minutes as needed for chest pain.          . predniSONE (DELTASONE) 5 MG tablet   Oral   Take 5 mg by mouth Daily.         . promethazine (PHENERGAN) 25 MG tablet   Oral   Take 25 mg by mouth Every 6 hours  as needed. For nausea         . ranitidine (ZANTAC) 150 MG tablet      TAKE 1 TABLET TWICE A DAY   60 tablet   3   . rOPINIRole (REQUIP) 1 MG tablet   Oral   Take 2 mg by mouth Every evening. Take at 2000          BP 160/69  Pulse 100  Temp(Src) 99.1 F (37.3 C)  Resp  20  SpO2 95% Physical Exam  Constitutional: He appears well-developed and well-nourished. No distress.  HENT:  Head: Atraumatic.  Right Ear: External ear normal.  Left Ear: External ear normal.  Mouth is dry, lips are dry  Eyes: Conjunctivae and EOM are normal. Pupils are equal, round, and reactive to light.  Neck: Normal range of motion. Neck supple.  No nuchal rigidity  Cardiovascular: Normal rate and regular rhythm.   Pulmonary/Chest: Effort normal. No respiratory distress. He has no wheezes.  Diffuse rhonchi and mild crackles heard  Abdominal:   Protuberant abdomen, nontender on palpation  Musculoskeletal:  Left BKA  Neurological: He is alert.  Skin: No rash noted.  Psychiatric: He has a normal mood and affect.    ED Course  Procedures (including critical care time)  4:50 PM Dialysis patient presents with symptoms concerning for pneumonia. He felt febrile with productive cough; rhonchi and rales heard on lung exam. No hypoxia. Cough medication given, chest x-ray and labs ordered  6:11 PM Patient is afebrile here, normal lactic acid, normal WBC, chest x-ray without acute infiltrate concerning for pneumonia. Evidence of renal insufficiency, at baseline. Normal potassium level. No pleural effusion.  6:21 PM This patient has had symptoms for a week i do not think Tamiflu would be beneficial. We'll provide close followup with PCP for further management. Patient reports improvement of his cough after taking Tessalon, will prescribed same.  Care discussed with Dr. Wyvonnia Dusky.  7:02 PM Pt desats to 81% on RA with ambulation.  Became short of breath.  Will obtain ECG, give breathing treatment, and will consider admission for bronchitis and hypoxia.   7:14 PM ECG normal.  Albuterol/atrovent nebs given. Will consult for admission.  Doxycycline given. Will check influenza PCR.  7:29 PM i have consulted with [facility], Dr. Posey Pronto, who agrees to see and admit pt.  He recommend  ABG.  Will order.    Labs Review Labs Reviewed  CBC WITH DIFFERENTIAL - Abnormal; Notable for the following:    RBC 3.31 (*)    Hemoglobin 11.4 (*)    HCT 34.1 (*)    MCV 103.0 (*)    MCH 34.4 (*)    Neutrophils Relative % 81 (*)    Lymphocytes Relative 9 (*)    All other components within normal limits  COMPREHENSIVE METABOLIC PANEL - Abnormal; Notable for the following:    Sodium 133 (*)    Chloride 91 (*)    Glucose, Bld 178 (*)    BUN 47 (*)    Creatinine, Ser 6.62 (*)    Total Protein 8.5 (*)    Albumin 3.1 (*)    Alkaline Phosphatase 118 (*)    GFR calc non Af Amer 9 (*)    GFR calc Af Amer 10 (*)    All other components within normal limits  INFLUENZA PANEL BY PCR (TYPE A & B, H1N1)  BLOOD GAS, ARTERIAL  I-STAT CG4 LACTIC ACID, ED   Imaging Review Dg Chest 2 View  06/16/2013  CLINICAL DATA:  Cough and chest congestion and fever.  Nausea.  EXAM: CHEST  2 VIEW  COMPARISON:  07/06/2012 and 07/03/2012  FINDINGS: There is chronic prominence of the superior mediastinum, probably due to mediastinal fat. Pulmonary vascularity is at the upper limits of normal. No consolidative infiltrates or effusions. No acute osseous abnormality.  IMPRESSION: Slight pulmonary vascular prominence.  No acute infiltrates.   Electronically Signed   By: Rozetta Nunnery M.D.   On: 06/16/2013 18:07    EKG Interpretation   None       MDM   Final diagnoses:  Bronchitis  Hypoxia    BP 136/54  Pulse 92  Temp(Src) 99.2 F (37.3 C) (Oral)  Resp 18  SpO2 96%  I have reviewed nursing notes and vital signs. I personally reviewed the imaging tests through PACS system  I reviewed available ER/hospitalization records thought the EMR     Domenic Moras, PA-C 06/16/13 [place], PA-C 06/16/13 1930

## 2013-06-17 ENCOUNTER — Encounter (HOSPITAL_COMMUNITY): Payer: Self-pay | Admitting: General Practice

## 2013-06-17 DIAGNOSIS — J111 Influenza due to unidentified influenza virus with other respiratory manifestations: Principal | ICD-10-CM

## 2013-06-17 LAB — INFLUENZA PANEL BY PCR (TYPE A & B)
H1N1 flu by pcr: NOT DETECTED
Influenza A By PCR: POSITIVE — AB
Influenza B By PCR: NEGATIVE

## 2013-06-17 LAB — CBC
HCT: 31.2 % — ABNORMAL LOW (ref 39.0–52.0)
Hemoglobin: 10.1 g/dL — ABNORMAL LOW (ref 13.0–17.0)
MCH: 33.4 pg (ref 26.0–34.0)
MCHC: 32.4 g/dL (ref 30.0–36.0)
MCV: 103.3 fL — ABNORMAL HIGH (ref 78.0–100.0)
Platelets: 183 10*3/uL (ref 150–400)
RBC: 3.02 MIL/uL — ABNORMAL LOW (ref 4.22–5.81)
RDW: 14.4 % (ref 11.5–15.5)
WBC: 8.8 10*3/uL (ref 4.0–10.5)

## 2013-06-17 LAB — COMPREHENSIVE METABOLIC PANEL
ALT: 15 U/L (ref 0–53)
AST: 24 U/L (ref 0–37)
Albumin: 2.9 g/dL — ABNORMAL LOW (ref 3.5–5.2)
Alkaline Phosphatase: 109 U/L (ref 39–117)
BUN: 55 mg/dL — ABNORMAL HIGH (ref 6–23)
CO2: 21 mEq/L (ref 19–32)
Calcium: 9.3 mg/dL (ref 8.4–10.5)
Chloride: 92 mEq/L — ABNORMAL LOW (ref 96–112)
Creatinine, Ser: 7.44 mg/dL — ABNORMAL HIGH (ref 0.50–1.35)
GFR calc Af Amer: 9 mL/min — ABNORMAL LOW (ref >90)
GFR calc non Af Amer: 8 mL/min — ABNORMAL LOW (ref >90)
Glucose, Bld: 215 mg/dL — ABNORMAL HIGH (ref 70–99)
Potassium: 4.4 mEq/L (ref 3.7–5.3)
Sodium: 132 mEq/L — ABNORMAL LOW (ref 137–147)
Total Bilirubin: 0.2 mg/dL — ABNORMAL LOW (ref 0.3–1.2)
Total Protein: 7.9 g/dL (ref 6.0–8.3)

## 2013-06-17 LAB — PROTIME-INR
INR: 1.3 (ref 0.00–1.49)
Prothrombin Time: 15.9 seconds — ABNORMAL HIGH (ref 11.6–15.2)

## 2013-06-17 LAB — MRSA PCR SCREENING: MRSA by PCR: NEGATIVE

## 2013-06-17 LAB — GLUCOSE, CAPILLARY
Glucose-Capillary: 241 mg/dL — ABNORMAL HIGH (ref 70–99)
Glucose-Capillary: 163 mg/dL — ABNORMAL HIGH (ref 70–99)
Glucose-Capillary: 278 mg/dL — ABNORMAL HIGH (ref 70–99)
Glucose-Capillary: 126 mg/dL — ABNORMAL HIGH (ref 70–99)
Glucose-Capillary: 83 mg/dL (ref 70–99)

## 2013-06-17 MED ORDER — HYDROMORPHONE HCL PF 1 MG/ML IJ SOLN
2.0000 mg | Freq: Once | INTRAMUSCULAR | Status: AC
  Administered 2013-06-17: 2 mg via INTRAVENOUS
  Filled 2013-06-17: qty 2

## 2013-06-17 MED ORDER — IPRATROPIUM-ALBUTEROL 0.5-2.5 (3) MG/3ML IN SOLN
3.0000 mL | Freq: Four times a day (QID) | RESPIRATORY_TRACT | Status: DC
  Administered 2013-06-17 (×4): 3 mL via RESPIRATORY_TRACT
  Filled 2013-06-17 (×4): qty 3

## 2013-06-17 MED ORDER — OSELTAMIVIR PHOSPHATE 30 MG PO CAPS: 30.0000 mg | ORAL_CAPSULE | ORAL | Status: DC

## 2013-06-17 MED ORDER — METHYLPREDNISOLONE SODIUM SUCC 40 MG IJ SOLR
40.0000 mg | Freq: Four times a day (QID) | INTRAMUSCULAR | Status: DC
Start: 2013-06-17 — End: 2013-06-18
  Administered 2013-06-17 – 2013-06-18 (×2): 40 mg via INTRAVENOUS
  Filled 2013-06-17 (×8): qty 1

## 2013-06-17 MED ORDER — INSULIN PUMP
SUBCUTANEOUS | Status: DC
  Administered 2013-06-17: 02:00:00 via SUBCUTANEOUS
  Filled 2013-06-17: qty 1

## 2013-06-17 MED ORDER — ASPIRIN EC 81 MG PO TBEC
81.0000 mg | DELAYED_RELEASE_TABLET | Freq: Every day | ORAL | Status: DC
  Administered 2013-06-17 – 2013-06-18 (×2): 81 mg via ORAL
  Filled 2013-06-17 (×2): qty 1

## 2013-06-17 MED ORDER — INSULIN PUMP
Freq: Three times a day (TID) | SUBCUTANEOUS | Status: DC
  Administered 2013-06-17: 7.9 via SUBCUTANEOUS
  Administered 2013-06-17: 17:00:00 via SUBCUTANEOUS
  Administered 2013-06-17: 4.3 via SUBCUTANEOUS
  Administered 2013-06-18 (×3): via SUBCUTANEOUS
  Filled 2013-06-17: qty 1

## 2013-06-17 MED ORDER — CALCIUM ACETATE 667 MG PO CAPS
2001.0000 mg | ORAL_CAPSULE | Freq: Three times a day (TID) | ORAL | Status: DC
  Administered 2013-06-17 – 2013-06-18 (×3): 2001 mg via ORAL
  Filled 2013-06-17 (×5): qty 3

## 2013-06-17 NOTE — Progress Notes (Signed)
[facility] PROGRESS NOTE  Dale [name] [id number] DOB: 06-21-1965 DOA: 06/16/2013 PCP: Windy Kalata, MD  Assessment/Plan: 1. Influenza- influenza PCR is positive for influenza A, Will start Tamiflu.  2. Acute bronchitis- Continue Duoneb nebulizers.  3. ESRD- HD per nephrology. 4. H/o Renal transplant- continue with prednisone 2.5 mg po every other day. 5. DM- Continue with SSI  Code Status: Full code Family Communication: *No family at bedside Disposition Plan: Home when stable   Consultants: Nephrology  Procedures:  None  Antibiotics:  doxycycline  HPI/Subjective: Patient seen and examined, admitted with URI, positive influenza.  Objective: Filed Vitals:   06/17/13 1000  BP: 133/72  Pulse: 95  Temp: 98.2 F (36.8 C)  Resp: 20    Intake/Output Summary (Last 24 hours) at 06/17/13 1756 Last data filed at 06/17/13 1100  Gross per 24 hour  Intake    240 ml  Output      0 ml  Net    240 ml   Filed Weights   06/16/13 2327  Weight: 127.3 kg (280 lb 10.3 oz)    Exam:  Physical Exam: Head: Normocephalic, atraumatic.  Eyes: No signs of jaundice, EOMI Nose: Mucous membranes dry.  Throat: Oropharynx nonerythematous, no exudate appreciated.  Neck: supple,No deformities, masses, or tenderness noted. Lungs: B/L rhonchi Heart: Regular RR. S1 and S2 normal  Abdomen: BS normoactive. Soft, Nondistended, non-tender.  Extremities: No pretibial edema, no erythema  Data Reviewed: Basic Metabolic Panel:  Recent Labs Lab 06/16/13 1619 06/17/13 0625  NA 133* 132*  K 4.6 4.4  CL 91* 92*  CO2 25 21  GLUCOSE 178* 215*  BUN 47* 55*  CREATININE 6.62* 7.44*  CALCIUM 9.4 9.3   Liver Function Tests:  Recent Labs Lab 06/16/13 1619 06/17/13 0625  AST 22 24  ALT 17 15  ALKPHOS 118* 109  BILITOT 0.3 0.2*  PROT 8.5* 7.9  ALBUMIN 3.1* 2.9*   No results found for this basename: LIPASE, AMYLASE,  in the last 168 hours No results found for this  basename: AMMONIA,  in the last 168 hours CBC:  Recent Labs Lab 06/16/13 1619 06/17/13 0625  WBC 9.0 8.8  NEUTROABS 7.3  --   HGB 11.4* 10.1*  HCT 34.1* 31.2*  MCV 103.0* 103.3*  PLT 210 183   Cardiac Enzymes: No results found for this basename: CKTOTAL, CKMB, CKMBINDEX, TROPONINI,  in the last 168 hours BNP (last 3 results) No results found for this basename: PROBNP,  in the last 8760 hours CBG:  Recent Labs Lab 06/17/13 0449 06/17/13 0816 06/17/13 1109 06/17/13 1656  GLUCAP 241* 163* 278* 126*    Recent Results (from the past 240 hour(s))  MRSA PCR SCREENING     Status: None   Collection Time    06/17/13  1:59 AM      Result Value Ref Range Status   MRSA by PCR NEGATIVE  NEGATIVE Final   Comment:            The GeneXpert MRSA Assay (FDA     approved for NASAL specimens     only), is one component of a     comprehensive MRSA colonization     surveillance program. It is not     intended to diagnose MRSA     infection nor to guide or     monitor treatment for     MRSA infections.     Studies: Dg Chest 2 View  06/16/2013   CLINICAL DATA:  Cough and chest  congestion and fever.  Nausea.  EXAM: CHEST  2 VIEW  COMPARISON:  07/06/2012 and 07/03/2012  FINDINGS: There is chronic prominence of the superior mediastinum, probably due to mediastinal fat. Pulmonary vascularity is at the upper limits of normal. No consolidative infiltrates or effusions. No acute osseous abnormality.  IMPRESSION: Slight pulmonary vascular prominence.  No acute infiltrates.   Electronically Signed   By: Rozetta Nunnery M.D.   On: 06/16/2013 18:07    Scheduled Meds: . allopurinol  200 mg Oral Daily  . aspirin EC  81 mg Oral Daily  . atorvastatin  10 mg Oral Daily  . calcium acetate  2,001 mg Oral TID WC  . cycloSPORINE modified  100 mg Oral Daily  . docusate sodium  200 mg Oral TID  . doxycycline (VIBRAMYCIN) IV  100 mg Intravenous Q12H  . famotidine  20 mg Oral Daily  . furosemide  160 mg  Oral BID  . guaiFENesin  600 mg Oral BID  . heparin  5,000 Units Subcutaneous 3 times per day  . insulin pump   Subcutaneous TID AC, HS, 0200  . ipratropium-albuterol  3 mL Nebulization Q6H  . levothyroxine  88 mcg Oral QAC breakfast  . loratadine  10 mg Oral Daily  . methylPREDNISolone (SOLU-MEDROL) injection  40 mg Intravenous Q6H  . metoCLOPramide  5 mg Oral TID PC & HS  . rOPINIRole  2 mg Oral Q2000  . sodium chloride  3 mL Intravenous Q12H   Continuous Infusions:   Principal Problem:   Acute respiratory failure with hypoxia Active Problems:   History of renal transplant   S/P BKA (below knee amputation)   Diabetes mellitus   Tracheobronchitis    Time spent: *25 min    [facility]  [facility] Pager [phone]*. If 7PM-7AM, please contact night-coverage at [email or web address], password [facility] 06/17/2013, 5:56 PM  LOS: 1 day

## 2013-06-17 NOTE — Progress Notes (Signed)
Inpatient Diabetes Program Recommendations  AACE/ADA: New Consensus Statement on Inpatient Glycemic Control (2013)  Target Ranges:  Prepandial:   less than 140 mg/dL      Peak postprandial:   less than 180 mg/dL (1-2 hours)      Critically ill patients:  140 - 180 mg/dL     Results for DAVINCI, HUTCHENS (MRN [id number]) as of 06/17/2013 11:48  Ref. Range 06/17/2013 04:49 06/17/2013 08:16  Glucose-Capillary Latest Range: 70-99 mg/dL 241 (H) 163 (H)    **Admitted with SOB/Cough.  Has History of ESRD, DM 1, HTN.  **Patient uses insulin pump at home to control CBGs.  Sees Dr. Starleen Blue with [facility] in [place], [place] (Endocrinologist).  Patient told me he plans to switch to Dr. Tamala Julian with [facility] Endocrinology asap.  **Basal rates on pump are as follows: MN- 3AM-  1.1 units/hr 3AM- 8AM- 2.05 units/hr 8AM-12PM- 1.4 units/hr 12PM- MN- 1.6 units/hr  Patient gets total of 38.35 units total basal in 24 hour period  Carbohydrate ratio is 1 unit insulin for every 10 grams of carbohydrates  Sensitivity factor is 1 unit for every 20 mg/dl above target CBG  Target CBG is 120 mg/dl   **Patient is currently A&OX3 and able to independently manage his insulin pump.  Due to change set/site/reservoir/insulin tomorrow (02/24).  Reminded patient to please have his family bring extra insulin pump supplies to the hospital as the hospital does not carry insulin pump supplies.  Patient agreeable and states he will call his wife today.  Reviewed documentation of insulin pump with Jonni Sanger, RN caring for [name].   Will follow. Wyn Quaker RN, MSN, CDE Diabetes Coordinator Inpatient Diabetes Program Team Pager: [phone] (8a-10p)

## 2013-06-17 NOTE — ED Provider Notes (Signed)
Medical screening examination/treatment/procedure(s) were conducted as a shared visit with non-physician practitioner(s) and myself.  I personally evaluated the patient during the encounter.   5 days of cough, SOB, nausea. Fever 101 at home. Some chest pain with coughing. Has not missed dialysis No distress, scattered expiratory wheezing and rhonchi CXR neg.  Desaturation to 81% with ambulation.  Treat for bronchitis. With infectious symptoms, doubt PE.   Ezequiel Essex, MD 06/17/13 [phone]

## 2013-06-17 NOTE — Consult Note (Signed)
Indication for Consultation:  Management of ESRD/hemodialysis; anemia, hypertension/volume and secondary hyperparathyroidism  HPI: Dale [name] is a 48 y.o. male with increased fatigue, decreased appetite and progressively worse sob since last Tuesday, with a low grade fever 100.5 on Sunday so he came to the ED for eval. He receives HD MWF @ [facility], hx of HTN, DM, failed kidney tx.  He has been compliant with his outpt HD and had been feeling well previously.   Past Medical History  Diagnosis Date  . Insulin dependent diabetes mellitus   . Hypertension   . History of blood transfusion   . ESRD (end stage renal disease)     a. 1995 s/p cadaveric transplant w/ susbequent failure after 18 yrs;  b.Dialysis initiated 07/2011  . Dyspnea     a. 07/23/11 Echo: EF 55-60%  . Anemia of chronic disease   . Cholecystitis     a. 08/27/2011  . Constipation   . Pericardial effusion     a.  Small by CT 08/22/11;  b.  Large by CT 08/27/11  . Inguinal lymphadenopathy     a. bilateral - s/p biopsy 07/2011  . Fibromyalgia    Past Surgical History  Procedure Laterality Date  . Appendectomy    . Hernia repair    . Kidney transplant    . Cataract extraction    . Arteriovenous graft placement    . Av fistula placement    . Toe amputation    . Below knee leg amputation    . Carpal tunnel release    . Shoulder surgery    . Av fistula placement  07/29/2011    Procedure: ARTERIOVENOUS (AV) FISTULA CREATION;  Surgeon: Mal Misty, MD;  Location: [place];  Service: Vascular;  Laterality: Right;  Brachial cephalic  . Insertion of dialysis catheter  08/01/2011    Procedure: INSERTION OF DIALYSIS CATHETER;  Surgeon: Rosetta Posner, MD;  Location: [place];  Service: Vascular;  Laterality: Right;  insertion of dialysis catheter on right internal jugular vein  . Lymph node biopsy  08/10/2011    Procedure: LYMPH NODE BIOPSY;  Surgeon: Merrie Roof, MD;  Location: [place];  Service: General;  Laterality: Left;  left groin lymph  biopsy  . Colonoscopy  08/29/2011    Procedure: COLONOSCOPY;  Surgeon: Jerene Bears, MD;  Location: [place];  Service: Gastroenterology;  Laterality: N/A;   Family History  Problem Relation Age of Onset  . Malignant hyperthermia Neg Hx   . Colon polyps Father   . Diabetes Father   . Cancer Father   . Breast cancer Maternal Aunt   . Cancer Maternal Aunt     Breast and Bone   Social History:  reports that he has never smoked. He has never used smokeless tobacco. He reports that he drinks alcohol. He reports that he does not use illicit drugs. Allergies  Allergen Reactions  . Morphine And Related Hives and Nausea Only  . Amoxicillin Other (See Comments)    Reaction unknown  . Ciprofloxacin Other (See Comments)    Reaction unknown  . Clindamycin/Lincomycin Other (See Comments)    Reaction unknown  . Codeine Other (See Comments)    Reaction unknown. But pt has tolerated hydrocodone in Vicodin and Norco in the past  . Levofloxacin Other (See Comments)    Reaction unknown  . Vasotec Cough  . Oxycodone Rash   Prior to Admission medications   Medication Sig Start Date End Date Taking? Authorizing  Provider  allopurinol (ZYLOPRIM) 100 MG tablet Take 200 mg by mouth daily. 2 Tabs 03/14/12  Yes Historical Provider, MD  aspirin 81 MG tablet Take 81 mg by mouth every evening.    Yes Historical Provider, MD  atorvastatin (LIPITOR) 10 MG tablet Take 10 mg by mouth daily.   Yes Historical Provider, MD  calcium acetate (PHOSLO) 667 MG capsule Take 667 mg by mouth 3 (three) times daily with meals.  09/19/12  Yes Historical Provider, MD  cycloSPORINE modified (NEORAL/GENGRAF) 100 MG capsule Take 100 mg by mouth daily. Neoral brand 06/26/11  Yes Historical Provider, MD  diphenhydrAMINE (BENADRYL) 25 mg capsule Take 50 mg by mouth at bedtime as needed for itching or sleep.   Yes Historical Provider, MD  docusate sodium (COLACE) 100 MG capsule Take 200 mg by mouth 3 (three) times daily.   Yes  Historical Provider, MD  epoetin alfa (EPOGEN,PROCRIT) 16109 UNIT/ML injection Inject 40,000 Units into the skin every 21 ( twenty-one) days. If hemoglobin is below 12   Yes Historical Provider, MD  furosemide (LASIX) 80 MG tablet Take 160 mg by mouth 2 (two) times daily.  09/03/12  Yes Historical Provider, MD  GLUCAGON EMERGENCY 1 MG injection Inject 1 mg into the muscle once as needed.  01/19/12  Yes Historical Provider, MD  HYDROmorphone (DILAUDID) 2 MG tablet Take 2 mg by mouth 3 (three) times daily as needed for severe pain.  09/03/12  Yes Historical Provider, MD  Insulin Human (INSULIN PUMP) 100 unit/ml SOLN Inject into the skin continuous. Humalog   Yes Historical Provider, MD  levothyroxine (SYNTHROID, LEVOTHROID) 88 MCG tablet Take 88 mcg by mouth daily.   Yes Historical Provider, MD  loratadine (CLARITIN) 10 MG tablet Take 10 mg by mouth daily.   Yes Historical Provider, MD  metoCLOPramide (REGLAN) 5 MG tablet Take 5 mg by mouth 4 (four) times daily - after meals and at bedtime. 05/26/11  Yes Historical Provider, MD  predniSONE (DELTASONE) 5 MG tablet Take 2.5 mg by mouth every other day.  06/26/11  Yes Historical Provider, MD  promethazine (PHENERGAN) 25 MG tablet Take 25 mg by mouth Every 6 hours as needed. For nausea 07/13/11  Yes Historical Provider, MD  ranitidine (ZANTAC) 150 MG tablet TAKE 1 TABLET TWICE A DAY 07/29/12  Yes Jerene Bears, MD  rOPINIRole (REQUIP) 1 MG tablet Take 2 mg by mouth Every evening. Take at 2000 07/18/11  Yes Historical Provider, MD  benzonatate (TESSALON) 100 MG capsule Take 1 capsule (100 mg total) by mouth every 8 (eight) hours. 06/16/13   Domenic Moras, PA-C  guaiFENesin (ROBITUSSIN) 100 MG/5ML liquid Take 5-10 mLs (100-200 mg total) by mouth every 4 (four) hours as needed for cough. 06/16/13   Domenic Moras, PA-C  nitroGLYCERIN (NITROSTAT) 0.4 MG SL tablet Place 0.4 mg under the tongue every 5 (five) minutes as needed for chest pain.  09/06/11 09/27/12  [name], MD    Current Facility-Administered Medications  Medication Dose Route Frequency Provider Last Rate Last Dose  . acetaminophen (TYLENOL) tablet 650 mg  650 mg Oral Q6H PRN Berle Mull, MD       Or  . acetaminophen (TYLENOL) suppository 650 mg  650 mg Rectal Q6H PRN Berle Mull, MD      . allopurinol (ZYLOPRIM) tablet 200 mg  200 mg Oral Daily Berle Mull, MD   200 mg at 06/17/13 1008  . aspirin EC tablet 81 mg  81 mg Oral Daily Berle Mull, MD  81 mg at 06/17/13 1008  . atorvastatin (LIPITOR) tablet 10 mg  10 mg Oral Daily Berle Mull, MD   10 mg at 06/17/13 1008  . benzonatate (TESSALON) capsule 100 mg  100 mg Oral TID PRN Berle Mull, MD   100 mg at 06/17/13 1009  . calcium acetate (PHOSLO) capsule 2,001 mg  2,001 mg Oral TID WC Marlena Clipper, NP      . cycloSPORINE modified (NEORAL) capsule 100 mg  100 mg Oral Daily Berle Mull, MD   100 mg at 06/17/13 1008  . docusate sodium (COLACE) capsule 200 mg  200 mg Oral TID Berle Mull, MD   200 mg at 06/17/13 1008  . doxycycline (VIBRAMYCIN) 100 mg in dextrose 5 % 250 mL IVPB  100 mg Intravenous Q12H Berle Mull, MD      . famotidine (PEPCID) tablet 20 mg  20 mg Oral Daily Berle Mull, MD   20 mg at 06/17/13 1008  . furosemide (LASIX) tablet 160 mg  160 mg Oral BID Berle Mull, MD   160 mg at 06/17/13 [id number]  . guaiFENesin (MUCINEX) 12 hr tablet 600 mg  600 mg Oral BID Berle Mull, MD   600 mg at 06/17/13 1008  . heparin injection 5,000 Units  5,000 Units Subcutaneous 3 times per day Berle Mull, MD   5,000 Units at 06/17/13 [id number]  . HYDROmorphone (DILAUDID) tablet 2 mg  2 mg Oral TID PRN Berle Mull, MD      . insulin pump   Subcutaneous TID AC, HS, 0200 Oswald Hillock, MD   4.3 each at 06/17/13 1250  . ipratropium-albuterol (DUONEB) 0.5-2.5 (3) MG/3ML nebulizer solution 3 mL  3 mL Nebulization Q6H Berle Mull, MD   3 mL at 06/17/13 1354  . levothyroxine (SYNTHROID, LEVOTHROID) tablet 88 mcg  88 mcg Oral QAC breakfast Berle Mull, MD    88 mcg at 06/17/13 [phone]  . loratadine (CLARITIN) tablet 10 mg  10 mg Oral Daily Berle Mull, MD   10 mg at 06/17/13 1008  . methylPREDNISolone sodium succinate (SOLU-MEDROL) 40 mg/mL injection 40 mg  40 mg Intravenous Q6H Oswald Hillock, MD      . metoCLOPramide (REGLAN) tablet 5 mg  5 mg Oral TID PC & HS Berle Mull, MD   5 mg at 06/17/13 1259  . nitroGLYCERIN (NITROSTAT) SL tablet 0.4 mg  0.4 mg Sublingual Q5 min PRN Berle Mull, MD      . ondansetron (ZOFRAN) tablet 4 mg  4 mg Oral Q6H PRN Berle Mull, MD       Or  . ondansetron (ZOFRAN) injection 4 mg  4 mg Intravenous Q6H PRN Berle Mull, MD      . rOPINIRole (REQUIP) tablet 2 mg  2 mg Oral Q2000 Berle Mull, MD      . sodium chloride 0.9 % injection 3 mL  3 mL Intravenous Q12H Berle Mull, MD       Labs: Basic Metabolic Panel:  Recent Labs Lab 06/16/13 1619 06/17/13 0625  NA 133* 132*  K 4.6 4.4  CL 91* 92*  CO2 25 21  GLUCOSE 178* 215*  BUN 47* 55*  CREATININE 6.62* 7.44*  CALCIUM 9.4 9.3   Liver Function Tests:  Recent Labs Lab 06/16/13 1619 06/17/13 0625  AST 22 24  ALT 17 15  ALKPHOS 118* 109  BILITOT 0.3 0.2*  PROT 8.5* 7.9  ALBUMIN 3.1* 2.9*   No results found for this basename: LIPASE, AMYLASE,  in the  last 168 hours No results found for this basename: AMMONIA,  in the last 168 hours CBC:  Recent Labs Lab 06/16/13 1619 06/17/13 0625  WBC 9.0 8.8  NEUTROABS 7.3  --   HGB 11.4* 10.1*  HCT 34.1* 31.2*  MCV 103.0* 103.3*  PLT 210 183   Cardiac Enzymes: No results found for this basename: CKTOTAL, CKMB, CKMBINDEX, TROPONINI,  in the last 168 hours CBG:  Recent Labs Lab 06/17/13 0449 06/17/13 0816 06/17/13 1109  GLUCAP 241* 163* 278*   Iron Studies: No results found for this basename: IRON, TIBC, TRANSFERRIN, FERRITIN,  in the last 72 hours Studies/Results: Dg Chest 2 View  06/16/2013   CLINICAL DATA:  Cough and chest congestion and fever.  Nausea.  EXAM: CHEST  2 VIEW  COMPARISON:   07/06/2012 and 07/03/2012  FINDINGS: There is chronic prominence of the superior mediastinum, probably due to mediastinal fat. Pulmonary vascularity is at the upper limits of normal. No consolidative infiltrates or effusions. No acute osseous abnormality.  IMPRESSION: Slight pulmonary vascular prominence.  No acute infiltrates.   Electronically Signed   By: Rozetta Nunnery M.D.   On: 06/16/2013 18:07    Review of Systems: Gen: Reports fever, chills, anorexia, fatigue and weakness HEENT: No visual complaints, No history of Retinopathy. Normal external appearance No Epistaxis or Sore throat. No sinusitis.   CV: Denies chest pain, angina, palpitations, syncope, orthopnea, PND, peripheral edema, and claudication. Resp: Reports dyspnea at rest, dyspnea with exercise, cough, white sputum. Denies wheezing, coughing up blood, and pleurisy. GI: Denies vomiting blood, jaundice, and fecal incontinence.   Denies dysphagia or odynophagia. Denies abd pain GU : Denies urinary burning, blood in urine, urinary frequency, urinary hesitancy, nocturnal urination, and urinary incontinence. MS: Denies joint pain, limitation of movement, and swelling, stiffness, low back pain, extremity pain. Denies muscle weakness, cramps, atrophy.  Takes dilaudid for fibromyalgia Derm: Denies rash, itching, dry skin, hives, moles, warts, or unhealing ulcers.  Psych: Denies depression, anxiety, memory loss, suicidal ideation, hallucinations, paranoia, and confusion. Heme: Denies bruising, bleeding, and enlarged lymph nodes. Neuro: No headache.  No diplopia. No dysarthria.  No dysphasia.  No history of CVA.  No Seizures. No paresthesias.  Endocrine  No Thyroid disease.  No Adrenal disease. DM on insulin pump- denies hypo/hyperglycemia.  Physical Exam: Filed Vitals:   06/16/13 2327 06/17/13 0217 06/17/13 0501 06/17/13 1000  BP: 144/62  142/65 133/72  Pulse: 99  94 95  Temp: 98.1 F (36.7 C)  98.3 F (36.8 C) 98.2 F (36.8 C)   TempSrc: Oral  Oral Oral  Resp: 18  18 20   Height: 5\' 8"  (1.727 m)     Weight: 127.3 kg (280 lb 10.3 oz)     SpO2: 100% 97% 97% 98%     General: Well developed, well nourished, in no acute distress. Head: Normocephalic, atraumatic, sclera non-icteric, mucus membranes are moist Neck: Supple. JVD not elevated.  Lungs: Bases dim with scattered rhonchi. Breathing is unlabored. Scattered exp wheezes upper lobes Heart: RRR with S1 S2. No murmurs, rubs, or gallops appreciated. Abdomen: Soft, obese non-tender, non-distended with normoactive bowel sounds. No rebound/guarding. No obvious abdominal masses. M-S:  Strength and tone appear normal for age. Lower extremities:without edema or ischemic changes, no open wounds. L BKA Neuro: Alert and oriented X 3. Moves all extremities spontaneously. Psych:  Responds to questions appropriately with a normal affect. Dialysis Access: R AVF +bruit/thrill  Dialysis Orders: MWF @ [facility]    3:45  123.5    4K/2.25Ca    9000u Heparin  L AVF  450/600  Epogen 100 Units IV/week  Venofer 25mc q week   Assessment/Plan: 1.  Dypsnea/cough / +flu A- workup per primary. Influenza A+. Chest xray mild vasc congestion. WBC 8.8, afebrile. Doxycycline started 2.  ESRD -  MWF @[facility] . HD pending today. K+ 4.4. On 4K bath outpt- 2K today 3.  Hypertension/volume  - 133/72, slight volume overload on exam. Cont home lasix. Up 4kg, max UF with HD 4.  Anemia  - hgb 10.1, cont outpt ESA and iron (tsat 34) watch CBC 5.  Metabolic bone disease -  Ca 9.3, phos 5.0 phoslo with meals. PTH 114. 6.  Nutrition - alb 2.9. Renal diet- dec appetite last week, encourage protein 7. DM- self management with insulin pump 8. Hx Renal transplant (1995 > 2013)- cyclosporin and prednisone, on slow taper  Shelle Iron, NP [facility] [phone] 06/17/2013, 3:10 PM   I have seen and examined patient, discussed with PA and agree with assessment and plan as outlined above with  additions as indicated. Kelly Splinter MD pager [phone]    cell [phone] 06/17/2013, 5:47 PM

## 2013-06-18 DIAGNOSIS — D649 Anemia, unspecified: Secondary | ICD-10-CM

## 2013-06-18 DIAGNOSIS — J111 Influenza due to unidentified influenza virus with other respiratory manifestations: Principal | ICD-10-CM

## 2013-06-18 LAB — GLUCOSE, CAPILLARY
Glucose-Capillary: 153 mg/dL — ABNORMAL HIGH (ref 70–99)
Glucose-Capillary: 261 mg/dL — ABNORMAL HIGH (ref 70–99)
Glucose-Capillary: 365 mg/dL — ABNORMAL HIGH (ref 70–99)

## 2013-06-18 MED ORDER — SODIUM CHLORIDE 0.9 % IV SOLN
62.5000 mg | INTRAVENOUS | Status: DC
  Administered 2013-06-18: 62.5 mg via INTRAVENOUS
  Filled 2013-06-18: qty 5

## 2013-06-18 MED ORDER — ALTEPLASE 2 MG IJ SOLR: 2.0000 mg | Freq: Once | INTRAMUSCULAR | Status: DC | PRN

## 2013-06-18 MED ORDER — HEPARIN SODIUM (PORCINE) 1000 UNIT/ML DIALYSIS: 9000.0000 [IU] | Freq: Once | INTRAMUSCULAR | Status: DC

## 2013-06-18 MED ORDER — GUAIFENESIN 100 MG/5ML PO LIQD: 100.0000 mg | ORAL | Status: DC | PRN

## 2013-06-18 MED ORDER — HEPARIN SODIUM (PORCINE) 1000 UNIT/ML DIALYSIS
1000.0000 [IU] | INTRAMUSCULAR | Status: DC | PRN
  Filled 2013-06-18: qty 1

## 2013-06-18 MED ORDER — LIDOCAINE HCL (PF) 1 % IJ SOLN: 5.0000 mL | INTRAMUSCULAR | Status: DC | PRN

## 2013-06-18 MED ORDER — BENZONATATE 100 MG PO CAPS: 100.0000 mg | ORAL_CAPSULE | Freq: Three times a day (TID) | ORAL | Status: DC

## 2013-06-18 MED ORDER — GI COCKTAIL ~~LOC~~
30.0000 mL | Freq: Once | ORAL | Status: DC
  Filled 2013-06-18: qty 30

## 2013-06-18 MED ORDER — OSELTAMIVIR PHOSPHATE 30 MG PO CAPS: 30.0000 mg | ORAL_CAPSULE | ORAL | Status: DC

## 2013-06-18 MED ORDER — SODIUM CHLORIDE 0.9 % IV SOLN: 100.0000 mL | INTRAVENOUS | Status: DC | PRN

## 2013-06-18 MED ORDER — NEPRO/CARBSTEADY PO LIQD: 237.0000 mL | ORAL | Status: DC | PRN

## 2013-06-18 MED ORDER — LIDOCAINE-PRILOCAINE 2.5-2.5 % EX CREA: 1.0000 "application " | TOPICAL_CREAM | CUTANEOUS | Status: DC | PRN

## 2013-06-18 MED ORDER — PENTAFLUOROPROP-TETRAFLUOROETH EX AERO: 1.0000 "application " | INHALATION_SPRAY | CUTANEOUS | Status: DC | PRN

## 2013-06-18 MED ORDER — DARBEPOETIN ALFA-POLYSORBATE 25 MCG/0.42ML IJ SOLN: 6.2500 ug | INTRAMUSCULAR | Status: DC

## 2013-06-18 MED ORDER — DOXYCYCLINE HYCLATE 50 MG PO CAPS: 100.0000 mg | ORAL_CAPSULE | Freq: Two times a day (BID) | ORAL | Status: DC

## 2013-06-18 MED ORDER — IPRATROPIUM-ALBUTEROL 0.5-2.5 (3) MG/3ML IN SOLN
3.0000 mL | Freq: Three times a day (TID) | RESPIRATORY_TRACT | Status: DC
  Administered 2013-06-18 (×2): 3 mL via RESPIRATORY_TRACT
  Filled 2013-06-18 (×2): qty 3

## 2013-06-18 MED ORDER — HYDROMORPHONE HCL PF 1 MG/ML IJ SOLN
1.0000 mg | Freq: Once | INTRAMUSCULAR | Status: AC
  Administered 2013-06-18: 1 mg via INTRAVENOUS
  Filled 2013-06-18: qty 1

## 2013-06-18 MED ORDER — OSELTAMIVIR PHOSPHATE 30 MG PO CAPS
30.0000 mg | ORAL_CAPSULE | ORAL | Status: DC
Start: 2013-06-18 — End: 2013-06-18
  Administered 2013-06-18: 30 mg via ORAL
  Filled 2013-06-18: qty 1

## 2013-06-18 NOTE — Progress Notes (Signed)
  [facility] Progress Note   Subjective: Still coughing, feeling a little better, temps mostly normal  Filed Vitals:   06/18/13 0220 06/18/13 0300 06/18/13 0533 06/18/13 0752  BP:  129/71 144/77 146/74  Pulse: 94 95 83 95  Temp:  98.5 F (36.9 C) 97.9 F (36.6 C) 97.6 F (36.4 C)  TempSrc:  Oral Oral Oral  Resp: 18 18 20 19   Height:      Weight:      SpO2:  91% 97% 95%   Exam Alert, no distress, coughing No jvd Chest occ wheezing on exp bilat RRR no MRG Abd soft, obese, nt, nd No LE edema, L BKA Neuro is nf, ox3 R AVF +bruit  Dialysis: MWF [facility] 3h 36min  123.5kg  4K/2.25 Bath  Heparin 9000  L AVF Epo ?     Venofer 50/wk   Assessment: 1 Dypsnea / cough / flu A- on po abx and tamiflu 2 ESRD on hemodialysis 3 HTN/volume- up 2-3 kg today 4 2HPT- cont binders 5 Anemia- low dose darbe 6 DM insulin pump 7 Hx renal tx- slow taper of cya and pred (HD since 2013)   Plan- HD Gretta Arab MD  pager [phone]    cell [phone]  06/18/2013, 9:38 AM     Recent Labs Lab 06/16/13 1619 06/17/13 0625  NA 133* 132*  K 4.6 4.4  CL 91* 92*  CO2 25 21  GLUCOSE 178* 215*  BUN 47* 55*  CREATININE 6.62* 7.44*  CALCIUM 9.4 9.3    Recent Labs Lab 06/16/13 1619 06/17/13 0625  AST 22 24  ALT 17 15  ALKPHOS 118* 109  BILITOT 0.3 0.2*  PROT 8.5* 7.9  ALBUMIN 3.1* 2.9*    Recent Labs Lab 06/16/13 1619 06/17/13 0625  WBC 9.0 8.8  NEUTROABS 7.3  --   HGB 11.4* 10.1*  HCT 34.1* 31.2*  MCV 103.0* 103.3*  PLT 210 183   . allopurinol  200 mg Oral Daily  . aspirin EC  81 mg Oral Daily  . atorvastatin  10 mg Oral Daily  . calcium acetate  2,001 mg Oral TID WC  . cycloSPORINE modified  100 mg Oral Daily  . darbepoetin  6.5476 mcg Intravenous Q Mon-HD  . docusate sodium  200 mg Oral TID  . doxycycline (VIBRAMYCIN) IV  100 mg Intravenous Q12H  . famotidine  20 mg Oral Daily  . ferric gluconate (FERRLECIT/NULECIT) IV  62.5 mg  Intravenous Q Mon-HD  . furosemide  160 mg Oral BID  . gi cocktail  30 mL Oral Once  . guaiFENesin  600 mg Oral BID  . heparin  5,000 Units Subcutaneous 3 times per day  . heparin  9,000 Units Dialysis Once in dialysis  . insulin pump   Subcutaneous TID AC, HS, 0200  . ipratropium-albuterol  3 mL Nebulization TID  . levothyroxine  88 mcg Oral QAC breakfast  . loratadine  10 mg Oral Daily  . methylPREDNISolone (SOLU-MEDROL) injection  40 mg Intravenous Q6H  . metoCLOPramide  5 mg Oral TID PC & HS  . oseltamivir  30 mg Oral Q M,W,F-1800  . rOPINIRole  2 mg Oral Q2000  . sodium chloride  3 mL Intravenous Q12H     sodium chloride, sodium chloride, acetaminophen, acetaminophen, alteplase, benzonatate, feeding supplement (NEPRO CARB STEADY), heparin, HYDROmorphone, lidocaine (PF), lidocaine-prilocaine, nitroGLYCERIN, ondansetron (ZOFRAN) IV, ondansetron, pentafluoroprop-tetrafluoroeth

## 2013-06-18 NOTE — Progress Notes (Signed)
Utilization review completed.  

## 2013-06-18 NOTE — Progress Notes (Signed)
SATURATION QUALIFICATIONS: (This note is used to comply with regulatory documentation for home oxygen)  Patient Saturations on Room Air at Rest = 97%  Patient Saturations on Room Air while Ambulating = 95%  Please briefly explain why patient needs home oxygen: n/a  Jillyn Ledger, MBA, BS, RN

## 2013-06-18 NOTE — Progress Notes (Signed)
Patient discharge teaching given, including activity, diet, follow-up appoints, and medications. Patient verbalized understanding of all discharge instructions. IV access was d/c'd. Vitals are stable. Skin is intact except as charted in most recent assessments. Pt to be escorted out by NT, to be driven home by family.  Callan Yontz, MBA, BS, RN 

## 2013-06-18 NOTE — Progress Notes (Signed)
Inpatient Diabetes Program Recommendations  AACE/ADA: New Consensus Statement on Inpatient Glycemic Control (2013)  Target Ranges:  Prepandial:   less than 140 mg/dL      Peak postprandial:   less than 180 mg/dL (1-2 hours)      Critically ill patients:  140 - 180 mg/dL    Results for ALMAN, NULL (MRN [id number]) as of 06/18/2013 12:52  Ref. Range 06/18/2013 07:50 06/18/2013 11:10  Glucose-Capillary Latest Range: 70-99 mg/dL 261 (H) 365 (H)     **Noted IV Solumedrol started last night.  CBGs elevated today.  **Patient changed set/site/reservoir/insulin this morning.  Patient remains A&Ox3 and able to independently manage insulin pump.  Spoke to patient by phone and alerted him to the fact that we are giving him IV steroids and that his CBGs will be elevated.  Patient stated he understood.    Will follow. Wyn Quaker RN, MSN, CDE Diabetes Coordinator Inpatient Diabetes Program Team Pager: [phone] (8a-10p)

## 2013-06-18 NOTE — Discharge Summary (Signed)
Physician Discharge Summary  Vasily Stolzman [id number] DOB: [birth year] DOA: 06/16/2013  PCP: Windy Kalata, MD  Admit date: 06/16/2013 Discharge date: 06/18/2013  Time spent: 50* minutes  Recommendations for Outpatient Follow-up:  1. *Follow up PCP in 2 weeks  Discharge Diagnoses:  Principal Problem:   Acute respiratory failure with hypoxia Active Problems:   History of renal transplant   S/P BKA (below knee amputation)   Diabetes mellitus   Tracheobronchitis   Influenza with other respiratory manifestations   Discharge Condition: *Stable  Diet recommendation: *low salt diet  Filed Weights   06/17/13 2037 06/17/13 2230 06/18/13 0209  Weight: 126.4 kg (278 lb 10.6 oz) 127.4 kg (280 lb 13.9 oz) 126.1 kg (278 lb)    History of present illness:  48 y.o. male with Past medical history of ESRD on hemodialysis, renal transplant, diabetes mellitus on insulin pump, hypertension.  The patient is coming from home.  The patient is presenting with complaints of cough and shortness of breath that has been ongoing since last Tuesday.  His cough is clear, without any blood, progressively worsening, associated with shortness of breath, wheezing, low-grade fever and night sweats.  He denies any sick contact or recent travel or immobilization.  He has pleuritic type of chest pain substernally gets worsen with breathing cough and on palpation.  He has history of ESRD on hemodialysis Monday Wednesday Friday, dry weight at his baseline 128 kg. Denies any significant weight gain.  He also had some nausea and dry heaves but denies any abdominal pain, constipation, diarrhea, burning urination, flank pain.  He is compliant with his medications, 3 weeks ago his cyclosporin was changed from 100 twice a day 200 daily and prednisone was changed from 5 daily to 2.5 every other day   Hospital Course:  1. Influenza- influenza PCR is positive for influenza A, Was started on Tamiflu, will continue with  two more doses for total of 5 days. 2. Acute bronchitis- Will send home on doxycycline 100 mg po BID for five more days. 3. ESRD- HD per nephrology. 4. H/o Renal transplant- continue with Cyclosporine and  prednisone 2.5 mg po every other day. 5. DM- Continue with insulin pump.   Procedures:  None  Consultations:  Nephrology   Discharge Exam: Filed Vitals:   06/18/13 1113  BP: 138/79  Pulse: 88  Temp: 98 F (36.7 C)  Resp: 19    General: *Appear in no acute distress Cardiovascular: *s1s2 RRR Respiratory: *Clear bilaterally  Discharge Instructions  Discharge Orders   Future Orders Complete By Expires   Diet - low sodium heart healthy  As directed    Discharge instructions  As directed    Comments:     Take two more doses of tamiflu with hemodialysis on Wednesday and friday   Increase activity slowly  As directed        Medication List    STOP taking these medications       metoprolol succinate 12.5 mg Tb24 24 hr tablet  Commonly known as:  TOPROL-XL      TAKE these medications       allopurinol 100 MG tablet  Commonly known as:  ZYLOPRIM  Take 200 mg by mouth daily. 2 Tabs     aspirin 81 MG tablet  Take 81 mg by mouth every evening.     atorvastatin 10 MG tablet  Commonly known as:  LIPITOR  Take 10 mg by mouth daily.     benzonatate 100 MG capsule  Commonly  known as:  TESSALON  Take 1 capsule (100 mg total) by mouth every 8 (eight) hours.     calcium acetate 667 MG capsule  Commonly known as:  PHOSLO  Take 667 mg by mouth 3 (three) times daily with meals.     cycloSPORINE modified 100 MG capsule  Commonly known as:  NEORAL  Take 100 mg by mouth daily. Neoral brand     diphenhydrAMINE 25 mg capsule  Commonly known as:  BENADRYL  Take 50 mg by mouth at bedtime as needed for itching or sleep.     docusate sodium 100 MG capsule  Commonly known as:  COLACE  Take 200 mg by mouth 3 (three) times daily.     epoetin alfa 40000 UNIT/ML injection   Commonly known as:  EPOGEN,PROCRIT  Inject 40,000 Units into the skin every 21 ( twenty-one) days. If hemoglobin is below 12     furosemide 80 MG tablet  Commonly known as:  LASIX  Take 160 mg by mouth 2 (two) times daily.     GLUCAGON EMERGENCY 1 MG injection  Generic drug:  glucagon  Inject 1 mg into the muscle once as needed.     guaiFENesin 100 MG/5ML liquid  Commonly known as:  ROBITUSSIN  Take 5-10 mLs (100-200 mg total) by mouth every 4 (four) hours as needed for cough.     HYDROmorphone 2 MG tablet  Commonly known as:  DILAUDID  Take 2 mg by mouth 3 (three) times daily as needed for severe pain.     insulin pump Soln  Inject into the skin continuous. Humalog     levothyroxine 88 MCG tablet  Commonly known as:  SYNTHROID, LEVOTHROID  Take 88 mcg by mouth daily.     loratadine 10 MG tablet  Commonly known as:  CLARITIN  Take 10 mg by mouth daily.     metoCLOPramide 5 MG tablet  Commonly known as:  REGLAN  Take 5 mg by mouth 4 (four) times daily - after meals and at bedtime.     nitroGLYCERIN 0.4 MG SL tablet  Commonly known as:  NITROSTAT  Place 0.4 mg under the tongue every 5 (five) minutes as needed for chest pain.     oseltamivir 30 MG capsule  Commonly known as:  TAMIFLU  Take 1 capsule (30 mg total) by mouth every Monday, Wednesday, and Friday at 6 PM.     predniSONE 5 MG tablet  Commonly known as:  DELTASONE  Take 2.5 mg by mouth every other day.     promethazine 25 MG tablet  Commonly known as:  PHENERGAN  Take 25 mg by mouth Every 6 hours as needed. For nausea     ranitidine 150 MG tablet  Commonly known as:  ZANTAC  TAKE 1 TABLET TWICE A DAY     rOPINIRole 1 MG tablet  Commonly known as:  REQUIP  Take 2 mg by mouth Every evening. Take at 2000       Allergies  Allergen Reactions  . Morphine And Related Hives and Nausea Only  . Amoxicillin Other (See Comments)    Reaction unknown  . Ciprofloxacin Other (See Comments)    Reaction unknown   . Clindamycin/Lincomycin Other (See Comments)    Reaction unknown  . Codeine Other (See Comments)    Reaction unknown. But pt has tolerated hydrocodone in Vicodin and Norco in the past  . Levofloxacin Other (See Comments)    Reaction unknown  . Vasotec Cough  .  Oxycodone Rash       Follow-up Information   Follow up with Windy Kalata, MD.   Specialty:  Nephrology   Contact information:   [place] [address] [phone]        The results of significant diagnostics from this hospitalization (including imaging, microbiology, ancillary and laboratory) are listed below for reference.    Significant Diagnostic Studies: Dg Chest 2 View  06/16/2013   CLINICAL DATA:  Cough and chest congestion and fever.  Nausea.  EXAM: CHEST  2 VIEW  COMPARISON:  07/06/2012 and 07/03/2012  FINDINGS: There is chronic prominence of the superior mediastinum, probably due to mediastinal fat. Pulmonary vascularity is at the upper limits of normal. No consolidative infiltrates or effusions. No acute osseous abnormality.  IMPRESSION: Slight pulmonary vascular prominence.  No acute infiltrates.   Electronically Signed   By: Rozetta Nunnery M.D.   On: 06/16/2013 18:07    Microbiology: Recent Results (from the past 240 hour(s))  MRSA PCR SCREENING     Status: None   Collection Time    06/17/13  1:59 AM      Result Value Ref Range Status   MRSA by PCR NEGATIVE  NEGATIVE Final   Comment:            The GeneXpert MRSA Assay (FDA     approved for NASAL specimens     only), is one component of a     comprehensive MRSA colonization     surveillance program. It is not     intended to diagnose MRSA     infection nor to guide or     monitor treatment for     MRSA infections.     Labs: Basic Metabolic Panel:  Recent Labs Lab 06/16/13 1619 06/17/13 0625  NA 133* 132*  K 4.6 4.4  CL 91* 92*  CO2 25 21  GLUCOSE 178* 215*  BUN 47* 55*  CREATININE 6.62* 7.44*  CALCIUM 9.4 9.3    Liver Function Tests:  Recent Labs Lab 06/16/13 1619 06/17/13 0625  AST 22 24  ALT 17 15  ALKPHOS 118* 109  BILITOT 0.3 0.2*  PROT 8.5* 7.9  ALBUMIN 3.1* 2.9*   No results found for this basename: LIPASE, AMYLASE,  in the last 168 hours No results found for this basename: AMMONIA,  in the last 168 hours CBC:  Recent Labs Lab 06/16/13 1619 06/17/13 0625  WBC 9.0 8.8  NEUTROABS 7.3  --   HGB 11.4* 10.1*  HCT 34.1* 31.2*  MCV 103.0* 103.3*  PLT 210 183   Cardiac Enzymes: No results found for this basename: CKTOTAL, CKMB, CKMBINDEX, TROPONINI,  in the last 168 hours BNP: BNP (last 3 results) No results found for this basename: PROBNP,  in the last 8760 hours CBG:  Recent Labs Lab 06/17/13 1656 06/17/13 2030 06/18/13 0257 06/18/13 0750 06/18/13 1110  GLUCAP 126* 83 153* 261* 365*       Signed:  Glendal Cassaday S  [facility] 06/18/2013, 3:24 PM

## 2013-11-01 ENCOUNTER — Emergency Department (HOSPITAL_COMMUNITY)
Admission: EM | Admit: 2013-11-01 | Discharge: 2013-11-01 | Disposition: A | Discharge status: Home / Self Care | Payer: Medicare Other | Attending: Emergency Medicine | Admitting: Emergency Medicine

## 2013-11-01 ENCOUNTER — Emergency Department (HOSPITAL_COMMUNITY): Payer: Medicare Other

## 2013-11-01 DIAGNOSIS — Z992 Dependence on renal dialysis: Secondary | ICD-10-CM | POA: Insufficient documentation

## 2013-11-01 DIAGNOSIS — E119 Type 2 diabetes mellitus without complications: Secondary | ICD-10-CM | POA: Insufficient documentation

## 2013-11-01 DIAGNOSIS — L500 Allergic urticaria: Secondary | ICD-10-CM | POA: Insufficient documentation

## 2013-11-01 DIAGNOSIS — Z862 Personal history of diseases of the blood and blood-forming organs and certain disorders involving the immune mechanism: Secondary | ICD-10-CM | POA: Insufficient documentation

## 2013-11-01 DIAGNOSIS — Z794 Long term (current) use of insulin: Secondary | ICD-10-CM | POA: Insufficient documentation

## 2013-11-01 DIAGNOSIS — N186 End stage renal disease: Secondary | ICD-10-CM

## 2013-11-01 DIAGNOSIS — T7840XA Allergy, unspecified, initial encounter: Secondary | ICD-10-CM

## 2013-11-01 DIAGNOSIS — K5900 Constipation, unspecified: Secondary | ICD-10-CM | POA: Insufficient documentation

## 2013-11-01 DIAGNOSIS — Z8739 Personal history of other diseases of the musculoskeletal system and connective tissue: Secondary | ICD-10-CM | POA: Insufficient documentation

## 2013-11-01 DIAGNOSIS — Z880 Allergy status to penicillin: Secondary | ICD-10-CM | POA: Insufficient documentation

## 2013-11-01 DIAGNOSIS — Z79899 Other long term (current) drug therapy: Secondary | ICD-10-CM | POA: Insufficient documentation

## 2013-11-01 DIAGNOSIS — Z7982 Long term (current) use of aspirin: Secondary | ICD-10-CM | POA: Insufficient documentation

## 2013-11-01 DIAGNOSIS — Z940 Kidney transplant status: Secondary | ICD-10-CM | POA: Insufficient documentation

## 2013-11-01 DIAGNOSIS — T50995A Adverse effect of other drugs, medicaments and biological substances, initial encounter: Secondary | ICD-10-CM | POA: Insufficient documentation

## 2013-11-01 DIAGNOSIS — I120 Hypertensive chronic kidney disease with stage 5 chronic kidney disease or end stage renal disease: Secondary | ICD-10-CM | POA: Insufficient documentation

## 2013-11-01 LAB — BASIC METABOLIC PANEL
Anion gap: 17 — ABNORMAL HIGH (ref 5–15)
BUN: 57 mg/dL — ABNORMAL HIGH (ref 6–23)
CO2: 26 mEq/L (ref 19–32)
Calcium: 9.5 mg/dL (ref 8.4–10.5)
Chloride: 95 mEq/L — ABNORMAL LOW (ref 96–112)
Creatinine, Ser: 7.35 mg/dL — ABNORMAL HIGH (ref 0.50–1.35)
GFR calc Af Amer: 9 mL/min — ABNORMAL LOW (ref >90)
GFR calc non Af Amer: 8 mL/min — ABNORMAL LOW (ref >90)
Glucose, Bld: 173 mg/dL — ABNORMAL HIGH (ref 70–99)
Potassium: 5.1 mEq/L (ref 3.7–5.3)
Sodium: 138 mEq/L (ref 137–147)

## 2013-11-01 LAB — CBC
HCT: 36.2 % — ABNORMAL LOW (ref 39.0–52.0)
Hemoglobin: 11.5 g/dL — ABNORMAL LOW (ref 13.0–17.0)
MCH: 33.2 pg (ref 26.0–34.0)
MCHC: 31.8 g/dL (ref 30.0–36.0)
MCV: 104.6 fL — ABNORMAL HIGH (ref 78.0–100.0)
Platelets: 242 10*3/uL (ref 150–400)
RBC: 3.46 MIL/uL — ABNORMAL LOW (ref 4.22–5.81)
RDW: 14.4 % (ref 11.5–15.5)
WBC: 9.4 10*3/uL (ref 4.0–10.5)

## 2013-11-01 LAB — I-STAT TROPONIN, ED: Troponin i, poc: 0.06 ng/mL (ref 0.00–0.08)

## 2013-11-01 MED ORDER — DIPHENHYDRAMINE HCL 25 MG PO CAPS
50.0000 mg | ORAL_CAPSULE | Freq: Once | ORAL | Status: AC
  Administered 2013-11-01: 50 mg via ORAL
  Filled 2013-11-01: qty 2

## 2013-11-01 MED ORDER — DIPHENHYDRAMINE HCL 25 MG PO CAPS: 25.0000 mg | ORAL_CAPSULE | Freq: Four times a day (QID) | ORAL | Status: DC | PRN

## 2013-11-01 MED ORDER — HYDROMORPHONE HCL PF 1 MG/ML IJ SOLN
3.0000 mg | Freq: Once | INTRAMUSCULAR | Status: AC
  Administered 2013-11-01: 3 mg via INTRAMUSCULAR
  Filled 2013-11-01: qty 3

## 2013-11-01 MED ORDER — FAMOTIDINE 20 MG PO TABS: 20.0000 mg | ORAL_TABLET | Freq: Two times a day (BID) | ORAL | Status: DC

## 2013-11-01 MED ORDER — METHYLPREDNISOLONE SODIUM SUCC 125 MG IJ SOLR
125.0000 mg | Freq: Once | INTRAMUSCULAR | Status: AC
  Administered 2013-11-01: 125 mg via INTRAMUSCULAR
  Filled 2013-11-01: qty 2

## 2013-11-01 MED ORDER — HYDROXYZINE HCL 10 MG PO TABS
10.0000 mg | ORAL_TABLET | Freq: Once | ORAL | Status: AC
  Administered 2013-11-01: 10 mg via ORAL
  Filled 2013-11-01: qty 1

## 2013-11-01 MED ORDER — FAMOTIDINE 20 MG PO TABS
40.0000 mg | ORAL_TABLET | Freq: Once | ORAL | Status: AC
Start: 2013-11-01 — End: 2013-11-01
  Administered 2013-11-01: 40 mg via ORAL
  Filled 2013-11-01: qty 2

## 2013-11-01 MED ORDER — PREDNISONE 20 MG PO TABS: ORAL_TABLET | ORAL | Status: DC

## 2013-11-01 MED ORDER — HYDROMORPHONE HCL 2 MG PO TABS: 2.0000 mg | ORAL_TABLET | Freq: Once | ORAL | Status: DC

## 2013-11-01 NOTE — ED Notes (Signed)
Dr. Manly at the bedside.  

## 2013-11-01 NOTE — ED Provider Notes (Signed)
CSN: [id number]     Arrival date & time 11/01/13  0110 History   First MD Initiated Contact with Patient 11/01/13 [phone]     Chief Complaint  Patient presents with  . Shortness of Breath  . Allergic Reaction     (Consider location/radiation/quality/duration/timing/severity/associated sxs/prior Treatment) HPI  This patient is a 48 yo man with multiple chronic medical problems who received IV contrast for a venogram yesterday at [facility] in [place]. Several hours after the procedure, the patient developed diffuse itching. He says he has hives over his torso. He says the itching is so bad that it is causing him pain and he needs IV dilaudid to control the pain associated with itching.   Past Medical History  Diagnosis Date  . Insulin dependent diabetes mellitus   . Hypertension   . History of blood transfusion   . ESRD (end stage renal disease)     a. 1995 s/p cadaveric transplant w/ susbequent failure after 18 yrs;  b.Dialysis initiated 07/2011  . Dyspnea     a. 07/23/11 Echo: EF 55-60%  . Anemia of chronic disease   . Cholecystitis     a. 08/27/2011  . Constipation   . Pericardial effusion     a.  Small by CT 08/22/11;  b.  Large by CT 08/27/11  . Inguinal lymphadenopathy     a. bilateral - s/p biopsy 07/2011  . Fibromyalgia    Past Surgical History  Procedure Laterality Date  . Appendectomy    . Hernia repair    . Kidney transplant    . Cataract extraction    . Arteriovenous graft placement    . Av fistula placement    . Toe amputation    . Below knee leg amputation    . Carpal tunnel release    . Shoulder surgery    . Av fistula placement  07/29/2011    Procedure: ARTERIOVENOUS (AV) FISTULA CREATION;  Surgeon: Mal Misty, MD;  Location: [place];  Service: Vascular;  Laterality: Right;  Brachial cephalic  . Insertion of dialysis catheter  08/01/2011    Procedure: INSERTION OF DIALYSIS CATHETER;  Surgeon: Rosetta Posner, MD;  Location: [place];  Service: Vascular;  Laterality:  Right;  insertion of dialysis catheter on right internal jugular vein  . Lymph node biopsy  08/10/2011    Procedure: LYMPH NODE BIOPSY;  Surgeon: Merrie Roof, MD;  Location: [place];  Service: General;  Laterality: Left;  left groin lymph biopsy  . Colonoscopy  08/29/2011    Procedure: COLONOSCOPY;  Surgeon: Jerene Bears, MD;  Location: [place];  Service: Gastroenterology;  Laterality: N/A;   Family History  Problem Relation Age of Onset  . Malignant hyperthermia Neg Hx   . Colon polyps Father   . Diabetes Father   . Cancer Father   . Breast cancer Maternal Aunt   . Cancer Maternal Aunt     Breast and Bone   History  Substance Use Topics  . Smoking status: Never Smoker   . Smokeless tobacco: Never Used  . Alcohol Use: Yes     Comment: occasionally 1xmonth    Review of Systems  Ten point review of symptoms performed and is negative with the exception of symptoms noted above. Chronic pain  Allergies  Morphine and related; Amoxicillin; Ciprofloxacin; Clindamycin/lincomycin; Codeine; Levofloxacin; Vasotec; and Oxycodone  Home Medications   Prior to Admission medications   Medication Sig Start Date End Date Taking? Authorizing Provider  allopurinol (ZYLOPRIM) 100 MG tablet Take 200 mg by mouth daily.  03/14/12  Yes Historical Provider, MD  aspirin 81 MG tablet Take 81 mg by mouth every evening.    Yes Historical Provider, MD  atorvastatin (LIPITOR) 10 MG tablet Take 10 mg by mouth every evening.    Yes Historical Provider, MD  calcium acetate (PHOSLO) 667 MG capsule Take 2,001 mg by mouth 3 (three) times daily with meals.  09/19/12  Yes Historical Provider, MD  diphenhydrAMINE (BENADRYL) 25 mg capsule Take 50 mg by mouth at bedtime as needed for itching or sleep.   Yes Historical Provider, MD  docusate sodium (COLACE) 100 MG capsule Take 200 mg by mouth 3 (three) times daily.   Yes Historical Provider, MD  furosemide (LASIX) 80 MG tablet Take 160 mg by mouth 2 (two) times  daily.  09/03/12  Yes Historical Provider, MD  GLUCAGON EMERGENCY 1 MG injection Inject 1 mg into the muscle daily as needed (low blood sugar).  01/19/12  Yes Historical Provider, MD  HYDROmorphone (DILAUDID) 2 MG tablet Take 2 mg by mouth 3 (three) times daily as needed for severe pain.  09/03/12  Yes Historical Provider, MD  Insulin Human (INSULIN PUMP) 100 unit/ml SOLN Inject into the skin continuous. Humalog   Yes Historical Provider, MD  levothyroxine (SYNTHROID, LEVOTHROID) 88 MCG tablet Take 88 mcg by mouth daily.   Yes Historical Provider, MD  loratadine (CLARITIN) 10 MG tablet Take 10 mg by mouth daily.   Yes Historical Provider, MD  metoCLOPramide (REGLAN) 5 MG tablet Take 5 mg by mouth 4 (four) times daily - after meals and at bedtime. 05/26/11  Yes Historical Provider, MD  nitroGLYCERIN (NITROSTAT) 0.4 MG SL tablet Place 0.4 mg under the tongue every 5 (five) minutes as needed for chest pain.  09/06/11 11/01/13 Yes [name], MD  promethazine (PHENERGAN) 25 MG tablet Take 25 mg by mouth Every 6 hours as needed for nausea or vomiting. For nausea 07/13/11  Yes Historical Provider, MD  ranitidine (ZANTAC) 150 MG tablet Take 150 mg by mouth 2 (two) times daily.   Yes Historical Provider, MD  rOPINIRole (REQUIP) 2 MG tablet Take 2 mg by mouth every evening.   Yes Historical Provider, MD   BP 136/66  Pulse 93  Temp(Src) 97.7 F (36.5 C) (Oral)  Resp 22  Ht 5\' 8"  (1.727 m)  Wt 275 lb (124.739 kg)  BMI 41.82 kg/m2  SpO2 97% Physical Exam Gen: well developed and well nourished appearing Head: NCAT Eyes: PERL, EOMI Nose: no epistaixis or rhinorrhea Mouth/throat: mucosa is moist and pink Neck: supple, no stridor Lungs: CTA B, no wheezing, rhonchi or rales CV: RRR, no murmur, extremities appear well perfused.  Abd: soft, obese, notender, nondistended Back: no ttp, no cva ttp Skin: warm and dry, no lesions appreciated, no rash Ext: s/p left bka,  Neuro: CN ii-xii grossly intact, no  focal deficits Psyche; normal affect,  calm and cooperative.  ED Course  Procedures (including critical care time) Labs Review Labs Reviewed  BASIC METABOLIC PANEL - Abnormal; Notable for the following:    Chloride 95 (*)    Glucose, Bld 173 (*)    BUN 57 (*)    Creatinine, Ser 7.35 (*)    GFR calc non Af Amer 8 (*)    GFR calc Af Amer 9 (*)    Anion gap 17 (*)    All other components within normal limits  CBC - Abnormal; Notable for the  following:    RBC 3.46 (*)    Hemoglobin 11.5 (*)    HCT 36.2 (*)    MCV 104.6 (*)    All other components within normal limits  I-STAT TROPOININ, ED    Imaging Review Dg Chest 2 View (if Patient Has Fever And/or Copd)  11/01/2013   CLINICAL DATA:  Shortness of breath. Allergic reaction. End-stage renal disease. Hypertension and diabetes.  EXAM: CHEST  2 VIEW  COMPARISON:  06/16/2013  FINDINGS: Heart size remains at the upper limits of normal. No evidence of pulmonary edema or other infiltrate. No evidence of pleural effusion. No mass or lymphadenopathy identified.  IMPRESSION: No active cardiopulmonary disease.   Electronically Signed   By: Earle Gell M.D.   On: 11/01/2013 02:20      MDM   Patient is stable for discharge with plan for benadryl, pepcid and prednisone.     Elyn Peers, MD 11/01/13 [phone]

## 2013-11-01 NOTE — ED Notes (Signed)
Per Pt: Pt had venogram performed today. States after the procedure he started developing hives, redness, itching. Pt reports now he has started to have some mild SOB. Currently ax4, NAD. Breathing is unlabored. No oral swelling noted.

## 2013-11-04 ENCOUNTER — Emergency Department (HOSPITAL_COMMUNITY): Payer: Medicare Other

## 2013-11-04 ENCOUNTER — Encounter (HOSPITAL_COMMUNITY): Payer: Self-pay | Admitting: Emergency Medicine

## 2013-11-04 ENCOUNTER — Emergency Department (HOSPITAL_COMMUNITY)
Admission: EM | Admit: 2013-11-04 | Discharge: 2013-11-04 | Disposition: A | Discharge status: Home / Self Care | Payer: Medicare Other | Attending: Emergency Medicine | Admitting: Emergency Medicine

## 2013-11-04 DIAGNOSIS — Z880 Allergy status to penicillin: Secondary | ICD-10-CM | POA: Insufficient documentation

## 2013-11-04 DIAGNOSIS — L270 Generalized skin eruption due to drugs and medicaments taken internally: Secondary | ICD-10-CM

## 2013-11-04 DIAGNOSIS — Z794 Long term (current) use of insulin: Secondary | ICD-10-CM | POA: Insufficient documentation

## 2013-11-04 DIAGNOSIS — R21 Rash and other nonspecific skin eruption: Secondary | ICD-10-CM | POA: Insufficient documentation

## 2013-11-04 DIAGNOSIS — T50995A Adverse effect of other drugs, medicaments and biological substances, initial encounter: Secondary | ICD-10-CM | POA: Insufficient documentation

## 2013-11-04 DIAGNOSIS — Z7982 Long term (current) use of aspirin: Secondary | ICD-10-CM | POA: Insufficient documentation

## 2013-11-04 DIAGNOSIS — Z862 Personal history of diseases of the blood and blood-forming organs and certain disorders involving the immune mechanism: Secondary | ICD-10-CM | POA: Insufficient documentation

## 2013-11-04 DIAGNOSIS — K5900 Constipation, unspecified: Secondary | ICD-10-CM | POA: Insufficient documentation

## 2013-11-04 DIAGNOSIS — E119 Type 2 diabetes mellitus without complications: Secondary | ICD-10-CM | POA: Insufficient documentation

## 2013-11-04 DIAGNOSIS — N186 End stage renal disease: Secondary | ICD-10-CM | POA: Insufficient documentation

## 2013-11-04 DIAGNOSIS — I120 Hypertensive chronic kidney disease with stage 5 chronic kidney disease or end stage renal disease: Secondary | ICD-10-CM | POA: Insufficient documentation

## 2013-11-04 DIAGNOSIS — IMO0002 Reserved for concepts with insufficient information to code with codable children: Secondary | ICD-10-CM | POA: Insufficient documentation

## 2013-11-04 MED ORDER — DIPHENHYDRAMINE HCL 50 MG/ML IJ SOLN
25.0000 mg | Freq: Once | INTRAMUSCULAR | Status: AC
  Administered 2013-11-04: 25 mg via INTRAVENOUS
  Filled 2013-11-04: qty 1

## 2013-11-04 MED ORDER — FAMOTIDINE IN NACL 20-0.9 MG/50ML-% IV SOLN
20.0000 mg | Freq: Once | INTRAVENOUS | Status: AC
  Administered 2013-11-04: 20 mg via INTRAVENOUS
  Filled 2013-11-04: qty 50

## 2013-11-04 MED ORDER — HYDROMORPHONE HCL PF 1 MG/ML IJ SOLN
0.5000 mg | Freq: Once | INTRAMUSCULAR | Status: AC
  Administered 2013-11-04: 0.5 mg via INTRAVENOUS
  Filled 2013-11-04: qty 1

## 2013-11-04 NOTE — Discharge Instructions (Signed)
Please follow up with your dialysis center for dialysis as it will also help you with your reaction.  Continue with medication previously prescribed.  Return if you develop throat swelling, tongue swelling, increase trouble breathing or if you have other concerns.    Drug Allergy Allergic reactions to medicines are common. Some allergic reactions are mild. A delayed type of drug allergy that occurs 1 week or more after exposure to a medicine or vaccine is called serum sickness. A life-threatening, sudden (acute) allergic reaction that involves the whole body is called anaphylaxis. CAUSES  "True" drug allergies occur when there is an allergic reaction to a medicine. This is caused by overactivity of the immune system. First, the body becomes sensitized. The immune system is triggered by your first exposure to the medicine. Following this first exposure, future exposure to the same medicine may be life-threatening. Almost any medicine can cause an allergic reaction. Common ones are:  Penicillin.  Sulfonamides (sulfa drugs).  Local anesthetics.  X-ray dyes that contain iodine. SYMPTOMS  Common symptoms of a minor allergic reaction are:  Swelling around the mouth.  An itchy red rash or hives.  Vomiting or diarrhea. Anaphylaxis can cause swelling of the mouth and throat. This makes it difficult to breathe and swallow. Severe reactions can be fatal within seconds, even after exposure to only a trace amount of the drug that causes the reaction. HOME CARE INSTRUCTIONS   If you are unsure of what caused your reaction, keep a diary of foods and medicines used. Include the symptoms that followed. Avoid anything that causes reactions.  You may want to follow up with an allergy specialist after the reaction has cleared in order to be tested to confirm the allergy. It is important to confirm that your reaction is an allergy, not just a side effect to the medicine. If you have a true allergy to a  medicine, this may prevent that medicine and related medicines from being given to you when you are very ill.  If you have hives or a rash:  Take medicines as directed by your caregiver.  You may use an over-the-counter antihistamine (diphenhydramine) as needed.  Apply cold compresses to the skin or take baths in cool water. Avoid hot baths or showers.  If you are severely allergic:  Continuous observation after a severe reaction may be needed. Hospitalization is often required.  Wear a medical alert bracelet or necklace stating your allergy.  You and your family must learn how to use an anaphylaxis kit or give an epinephrine injection to temporarily treat an emergency allergic reaction. If you have had a severe reaction, always carry your epinephrine injection or anaphylaxis kit with you. This can be lifesaving if you have a severe reaction.  Do not drive or perform tasks after treatment until the medicines used to treat your reaction have worn off, or until your caregiver says it is okay. SEEK MEDICAL CARE IF:   You think you had an allergic reaction. Symptoms usually start within 30 minutes after exposure.  Symptoms are getting worse rather than better.  You develop new symptoms.  The symptoms that brought you to your caregiver return. SEEK IMMEDIATE MEDICAL CARE IF:   You have swelling of the mouth, difficulty breathing, or wheezing.  You have a tight feeling in your chest or throat.  You develop hives, swelling, or itching all over your body.  You develop severe vomiting or diarrhea.  You feel faint or pass out. This is an emergency.  Use your epinephrine injection or anaphylaxis kit as you have been instructed. Call for emergency medical help. Even if you improve after the injection, you need to be examined at a hospital emergency department. MAKE SURE YOU:   Understand these instructions.  Will watch your condition.  Will get help right away if you are not doing  well or get worse. Document Released: 04/11/2005 Document Revised: 07/04/2011 Document Reviewed: 09/15/2010 [facility] Patient Information 2015 [place], [place]. This information is not intended to replace advice given to you by your health care provider. Make sure you discuss any questions you have with your health care provider.

## 2013-11-04 NOTE — ED Notes (Addendum)
Pt from home with c/o worsening allergic reaction from contrast dye given this past Thurs.  Pt was seen here this past Fri, given solu-medrol, benadryl, and Pepcid, sent home with prednisone.  Red, itchy rash noted over visible skin; hot to touch.  Reports 8/10 pain in the more concentrated areas, last dose 50 mg Benadryl at 230am.  Dialysis pt. Pt in NAD, A&O.

## 2013-11-04 NOTE — ED Provider Notes (Signed)
CSN: [id number]     Arrival date & time 11/04/13  0702 History   First MD Initiated Contact with Patient 11/04/13 [phone]     No chief complaint on file.    (Consider location/radiation/quality/duration/timing/severity/associated sxs/prior Treatment) HPI  48 year old male with history of insulin-dependent diabetes, end-stage renal disease currently on dialysis, fibromyalgia presents for management of an allergic reaction.  Pt has Thoracic Outlet Syndrome, and his doctor was attempted to perform a venogram and assess his vein 4 days ago at [facility] in [place].  Pt sts the procedure fail and they were unable to dilate the vessel.  After the procedure he developed itch, hives consistent with allergic reaction.  He was seen in ER 3 days ago and was given solumedrol, pepcid, benadryl and discharge.  Pt continues to endorse diffuse pruritus rash, that is progressive.  Sts rash is so bad it hurts. Today he endorse mild breathing discomfort and occasional non productive cough.  Denies fever, chills, neck stiffness, cp, abd pain, n/v/d.  Pt is MWF dialysis, last dialysis was 3 days ago and is due for one today.  Pt makes minimal urine.      Past Medical History  Diagnosis Date  . Insulin dependent diabetes mellitus   . Hypertension   . History of blood transfusion   . ESRD (end stage renal disease)     a. 1995 s/p cadaveric transplant w/ susbequent failure after 18 yrs;  b.Dialysis initiated 07/2011  . Dyspnea     a. 07/23/11 Echo: EF 55-60%  . Anemia of chronic disease   . Cholecystitis     a. 08/27/2011  . Constipation   . Pericardial effusion     a.  Small by CT 08/22/11;  b.  Large by CT 08/27/11  . Inguinal lymphadenopathy     a. bilateral - s/p biopsy 07/2011  . Fibromyalgia    Past Surgical History  Procedure Laterality Date  . Appendectomy    . Hernia repair    . Kidney transplant    . Cataract extraction    . Arteriovenous graft placement    . Av fistula placement    . Toe  amputation    . Below knee leg amputation    . Carpal tunnel release    . Shoulder surgery    . Av fistula placement  07/29/2011    Procedure: ARTERIOVENOUS (AV) FISTULA CREATION;  Surgeon: Mal Misty, MD;  Location: [place];  Service: Vascular;  Laterality: Right;  Brachial cephalic  . Insertion of dialysis catheter  08/01/2011    Procedure: INSERTION OF DIALYSIS CATHETER;  Surgeon: Rosetta Posner, MD;  Location: [place];  Service: Vascular;  Laterality: Right;  insertion of dialysis catheter on right internal jugular vein  . Lymph node biopsy  08/10/2011    Procedure: LYMPH NODE BIOPSY;  Surgeon: Merrie Roof, MD;  Location: [place];  Service: General;  Laterality: Left;  left groin lymph biopsy  . Colonoscopy  08/29/2011    Procedure: COLONOSCOPY;  Surgeon: Jerene Bears, MD;  Location: [place];  Service: Gastroenterology;  Laterality: N/A;   Family History  Problem Relation Age of Onset  . Malignant hyperthermia Neg Hx   . Colon polyps Father   . Diabetes Father   . Cancer Father   . Breast cancer Maternal Aunt   . Cancer Maternal Aunt     Breast and Bone   History  Substance Use Topics  . Smoking status: Never Smoker   .  Smokeless tobacco: Never Used  . Alcohol Use: Yes     Comment: occasionally 1xmonth    Review of Systems  Constitutional: Negative for fever.  Skin: Positive for rash.  All other systems reviewed and are negative.     Allergies  Morphine and related; Amoxicillin; Ciprofloxacin; Clindamycin/lincomycin; Codeine; Levofloxacin; Vasotec; and Oxycodone  Home Medications   Prior to Admission medications   Medication Sig Start Date End Date Taking? Authorizing Provider  allopurinol (ZYLOPRIM) 100 MG tablet Take 200 mg by mouth daily.  03/14/12   Historical Provider, MD  aspirin 81 MG tablet Take 81 mg by mouth every evening.     Historical Provider, MD  atorvastatin (LIPITOR) 10 MG tablet Take 10 mg by mouth every evening.     Historical Provider, MD   calcium acetate (PHOSLO) 667 MG capsule Take 2,001 mg by mouth 3 (three) times daily with meals.  09/19/12   Historical Provider, MD  diphenhydrAMINE (BENADRYL) 25 mg capsule Take 50 mg by mouth at bedtime as needed for itching or sleep.    Historical Provider, MD  diphenhydrAMINE (BENADRYL) 25 mg capsule Take 1 capsule (25 mg total) by mouth every 6 (six) hours as needed. 11/01/13   Elyn Peers, MD  docusate sodium (COLACE) 100 MG capsule Take 200 mg by mouth 3 (three) times daily.    Historical Provider, MD  famotidine (PEPCID) 20 MG tablet Take 1 tablet (20 mg total) by mouth 2 (two) times daily. 11/01/13   Elyn Peers, MD  furosemide (LASIX) 80 MG tablet Take 160 mg by mouth 2 (two) times daily.  09/03/12   Historical Provider, MD  GLUCAGON EMERGENCY 1 MG injection Inject 1 mg into the muscle daily as needed (low blood sugar).  01/19/12   Historical Provider, MD  HYDROmorphone (DILAUDID) 2 MG tablet Take 2 mg by mouth 3 (three) times daily as needed for severe pain.  09/03/12   Historical Provider, MD  Insulin Human (INSULIN PUMP) 100 unit/ml SOLN Inject into the skin continuous. Humalog    Historical Provider, MD  levothyroxine (SYNTHROID, LEVOTHROID) 88 MCG tablet Take 88 mcg by mouth daily.    Historical Provider, MD  loratadine (CLARITIN) 10 MG tablet Take 10 mg by mouth daily.    Historical Provider, MD  metoCLOPramide (REGLAN) 5 MG tablet Take 5 mg by mouth 4 (four) times daily - after meals and at bedtime. 05/26/11   Historical Provider, MD  nitroGLYCERIN (NITROSTAT) 0.4 MG SL tablet Place 0.4 mg under the tongue every 5 (five) minutes as needed for chest pain.  09/06/11 11/01/13  [name], MD  predniSONE (DELTASONE) 20 MG tablet 3 tabs po day one, then 2 po daily x 4 days 11/01/13   Elyn Peers, MD  promethazine (PHENERGAN) 25 MG tablet Take 25 mg by mouth Every 6 hours as needed for nausea or vomiting. For nausea 07/13/11   Historical Provider, MD  ranitidine (ZANTAC) 150 MG tablet Take  150 mg by mouth 2 (two) times daily.    Historical Provider, MD  rOPINIRole (REQUIP) 2 MG tablet Take 2 mg by mouth every evening.    Historical Provider, MD   There were no vitals taken for this visit. Physical Exam  Constitutional: He is oriented to person, place, and time. He appears well-developed and well-nourished. No distress.  Moderately obese Caucasian male appears to be in no acute distress.  HENT:  Head: Atraumatic.  Normal oral mucosa, no tongue swelling, no lip swelling, no obvious oropharynx edema  Eyes: Conjunctivae are normal.  Neck: Normal range of motion. Neck supple.  Cardiovascular: Normal rate and regular rhythm.   Pulmonary/Chest: Effort normal and breath sounds normal. He has no wheezes.  Abdominal:  Protuberant abdomen, nontender on exam  Musculoskeletal:  Left AKA with hardware in place  Neurological: He is alert and oriented to person, place, and time.  Skin: No rash (diffuse pruritic rash noted throughout body including torso, arms, and leg. ) noted.  Right forearm AV fistula with palpable bruits and thrills  Psychiatric: He has a normal mood and affect.    ED Course  Procedures (including critical care time)  7:34 AM Patient developed pruritic rash after receiving IV contrast for a venogram was performed 4 days ago. Pt endorse itchiness, shortness of breath, and rash it would not go away. Given his state of health and history of end-stage renal disease requiring dialysis, I suspect his prolonged course is secondary to his current medical condition.  Although patient did mention having some shortness of breath, he is not hypoxic and appears to be in no acute respiratory distress. Chest x-ray ordered. Will treat symptoms. I suspect symptoms will steadily improving after receiving his regular dialysis.  9:09 AM No worsening of symptoms. Patient however demanded more pain medication. States he has history of fibromyalgia and this rash is worse if his symptoms.  He takes Dilaudid at home. He was given a dose or 2 but states it did not help. However this time I do not think further narcotic pain medication as indicated. Patient can take his medication at home as needed. Return precautions discussed. Patient to followup with his dialysis center as previously scheduled. Care discussed with Dr. Ashok Cordia who has evaluated pt.    Labs Review Labs Reviewed - No data to display  Imaging Review No results found.   EKG Interpretation None      MDM   Final diagnoses:  Allergic drug rash    BP 136/67  Pulse 80  Temp(Src) 97.5 F (36.4 C) (Oral)  Resp 19  SpO2 97%     Domenic Moras, PA-C 11/04/13 [phone]

## 2013-11-06 NOTE — ED Provider Notes (Signed)
Medical screening examination/treatment/procedure(s) were conducted as a shared visit with non-physician practitioner(s) and myself.  I personally evaluated the patient during the encounter.  Pt presents for follow up rash/possibly allergic rxn to iv contrast. Symptoms no worse, but persistent/have not resolved. C/o pruritus. No sob. No faintness. No fever or chills.    Mirna Mires, MD 11/06/13 [phone]

## 2013-12-19 ENCOUNTER — Other Ambulatory Visit (HOSPITAL_COMMUNITY): Payer: Self-pay | Admitting: Nephrology

## 2013-12-19 DIAGNOSIS — T8612 Kidney transplant failure: Secondary | ICD-10-CM

## 2013-12-24 ENCOUNTER — Ambulatory Visit (HOSPITAL_COMMUNITY)
Admission: RE | Admit: 2013-12-24 | Discharge: 2013-12-24 | Disposition: A | Discharge status: Home / Self Care | Payer: Medicare Other | Source: Ambulatory Visit | Attending: Nephrology | Admitting: Nephrology

## 2013-12-24 DIAGNOSIS — T8612 Kidney transplant failure: Secondary | ICD-10-CM

## 2013-12-24 DIAGNOSIS — T8610 Unspecified complication of kidney transplant: Secondary | ICD-10-CM | POA: Diagnosis present

## 2013-12-24 DIAGNOSIS — Y830 Surgical operation with transplant of whole organ as the cause of abnormal reaction of the patient, or of later complication, without mention of misadventure at the time of the procedure: Secondary | ICD-10-CM | POA: Insufficient documentation

## 2014-02-14 ENCOUNTER — Ambulatory Visit: Payer: Medicare Other | Admitting: Physician Assistant

## 2014-02-18 ENCOUNTER — Encounter: Payer: Self-pay | Admitting: Physician Assistant

## 2014-02-18 ENCOUNTER — Ambulatory Visit (INDEPENDENT_AMBULATORY_CARE_PROVIDER_SITE_OTHER): Payer: Medicare Other | Admitting: Physician Assistant

## 2014-02-18 VITALS — BP 138/68 | HR 87 | Ht 68.0 in | Wt 285.0 lb

## 2014-02-18 DIAGNOSIS — R0600 Dyspnea, unspecified: Secondary | ICD-10-CM

## 2014-02-18 DIAGNOSIS — I10 Essential (primary) hypertension: Secondary | ICD-10-CM

## 2014-02-18 DIAGNOSIS — R079 Chest pain, unspecified: Secondary | ICD-10-CM

## 2014-02-18 DIAGNOSIS — N186 End stage renal disease: Secondary | ICD-10-CM

## 2014-02-18 DIAGNOSIS — R0609 Other forms of dyspnea: Secondary | ICD-10-CM

## 2014-02-18 MED ORDER — ISOSORBIDE MONONITRATE ER 30 MG PO TB24: 15.0000 mg | ORAL_TABLET | Freq: Every day | ORAL | Status: DC

## 2014-02-18 MED ORDER — ISOSORBIDE MONONITRATE ER 30 MG PO TB24: 30.0000 mg | ORAL_TABLET | Freq: Every day | ORAL | Status: DC

## 2014-02-18 NOTE — Patient Instructions (Addendum)
START IMDUR 30 MG TABLET; YOU WILL TAKE 1/2 TABLET ON Tuesday, Thursday, Saturday'S AND Sunday'S TO = 15 MG   Your physician has requested that you have an echocardiogram. Echocardiography is a painless test that uses sound waves to create images of your heart. It provides your doctor with information about the size and shape of your heart and how well your heart's chambers and valves are working. This procedure takes approximately one hour. There are no restrictions for this procedure. THIS WILL HAVE TO BE ON  NON DIALYSIS DAYS  Your physician has requested that you have a lexiscan myoview. For further information please visit [email or web address]. Please follow instruction sheet, as given. THIS WILL HAVE TO BE ON  NON DIALYSIS DAYS    Your physician recommends that you schedule a follow-up appointment in: 2-3 [place], [place] IS IN THE OFFICE

## 2014-02-18 NOTE — Progress Notes (Signed)
Cardiology Office Note   Date:  02/18/2014   ID:  Dale [name], DOB 1965/11/12, MRN [id number]  PCP:  Windy Kalata, MD  Cardiologist:  Dr. Lauree Chandler     History of Present Illness: Dale [name] is a 48 y.o. male with a history of diabetes, HTN, sleep apnea, ESRD, fibromyalgia, chronic anemia, status post left BKA. He underwent renal transplant and has been off of dialysis 10/2011.  He was seen by Dr. Lauree Chandler in 05/2012 in the hospital for chest pain. Diagnostic options were limited as cardiac catheterization would have been detrimental to his transplanted kidney. Therefore, stress testing was not thought to be helpful. Medical therapy was recommended. Further ischemic evaluation could be pursued if he had recurrent symptoms.   Admitted 06/2012 with severe septic shock secondary to Salmonella bacteremia.  He developed worsening renal function and hemodialysis was resumed.  Last seen here 09/27/12.  He returns for further evaluation of dyspnea with exertion. He ambulates with a left lower extremity prosthesis. His nephrologist decreased his dry weight. He had little relief with this. He describes NYHA class III symptoms. This has gradually worsened over the past year.  He does describe orthopnea. He denies PND. He's had minimal RLE edema. He denies chest discomfort. He apparently had a chest x-ray at [facility]. This was apparently unremarkable.     Studies:  - Echocardiogram 06/18/12: Moderate LVH, EF 55-60%, MAC.   Recent Labs/Images:  Recent Labs  06/17/13 0625 11/01/13 0210  NA 132* 138  K 4.4 5.1  BUN 55* 57*  CREATININE 7.44* 7.35*  ALT 15  --   HGB 10.1* 11.5*       Wt Readings from Last 3 Encounters:  02/18/14 285 lb (129.275 kg)  11/01/13 275 lb (124.739 kg)  06/18/13 278 lb (126.1 kg)     Past Medical History  Diagnosis Date  . Insulin dependent diabetes mellitus   . Hypertension   . History of blood  transfusion   . ESRD (end stage renal disease)     a. 1995 s/p cadaveric transplant w/ susbequent failure after 18 yrs;  b.Dialysis initiated 07/2011  . Dyspnea     a. 07/23/11 Echo: EF 55-60%  . Anemia of chronic disease   . Cholecystitis     a. 08/27/2011  . Constipation   . Pericardial effusion     a.  Small by CT 08/22/11;  b.  Large by CT 08/27/11  . Inguinal lymphadenopathy     a. bilateral - s/p biopsy 07/2011  . Fibromyalgia     Current Outpatient Prescriptions  Medication Sig Dispense Refill  . allopurinol (ZYLOPRIM) 100 MG tablet Take 200 mg by mouth daily.       [name] Kitchen aspirin 81 MG tablet Take 81 mg by mouth every evening.       [name] Kitchen atorvastatin (LIPITOR) 10 MG tablet Take 10 mg by mouth every evening.       . calcium acetate (PHOSLO) 667 MG capsule Take 2,001 mg by mouth 3 (three) times daily with meals.       . diphenhydrAMINE (BENADRYL) 25 mg capsule Take 50 mg by mouth at bedtime as needed for itching or sleep.      [name] Kitchen docusate sodium (COLACE) 100 MG capsule Take 200 mg by mouth 3 (three) times daily.      . furosemide (LASIX) 80 MG tablet Take 160 mg by mouth 2 (two) times daily.       [name] Kitchen  GLUCAGON EMERGENCY 1 MG injection Inject 1 mg into the muscle daily as needed (low blood sugar).       . hydrocortisone cream 1 % Apply 1 application topically daily as needed for itching.      [name] Kitchen HYDROmorphone (DILAUDID) 2 MG tablet Take 2 mg by mouth 3 (three) times daily as needed for severe pain.       . Insulin Human (INSULIN PUMP) 100 unit/ml SOLN Inject into the skin continuous. Humalog      . levothyroxine (SYNTHROID, LEVOTHROID) 100 MCG tablet Take 100 mcg by mouth daily before breakfast.      . loratadine (CLARITIN) 10 MG tablet Take 10 mg by mouth daily.      . metoCLOPramide (REGLAN) 5 MG tablet Take 5 mg by mouth 4 (four) times daily - after meals and at bedtime.      . nitroGLYCERIN (NITROSTAT) 0.4 MG SL tablet Place 0.4 mg under the tongue every 5 (five) minutes as needed for chest  pain.       . promethazine (PHENERGAN) 25 MG tablet Take 25 mg by mouth Every 6 hours as needed for nausea or vomiting. For nausea      . ranitidine (ZANTAC) 150 MG tablet Take 150 mg by mouth 2 (two) times daily.      [name] Kitchen rOPINIRole (REQUIP) 2 MG tablet Take 2 mg by mouth every evening.       No current facility-administered medications for this visit.     Allergies:   Doxycycline; Hydrocodone; Morphine and related; Amoxicillin; Ciprofloxacin; Clindamycin/lincomycin; Codeine; Levofloxacin; Vasotec; Contrast media; and Oxycodone   Social History:  The patient  reports that he has never smoked. He has never used smokeless tobacco. He reports that he drinks alcohol. He reports that he does not use illicit drugs.   Family History:  [name] family history includes Breast cancer in his maternal aunt; Cancer in his father and maternal aunt; Colon polyps in his father; Diabetes in his father; Hypertension in his father, father, and mother. There is no history of Malignant hyperthermia, Heart attack, or Stroke.   ROS:  Please see the history of present illness.    He has had a low-grade temperature (99.4). He describes some constipation. He denies melena, hematochezia, significant night sweats or unexpected weight loss.  He had a recent diabetic ulcer on his right lower extremity. He currently has an Haematologist in place.  All other systems reviewed and negative.    PHYSICAL EXAM: VS:  BP 138/68  Pulse 87  Ht 5\' 8"  (1.727 m)  Wt 285 lb (129.275 kg)  BMI 43.34 kg/m2 Well nourished, well developed, in no acute distress HEENT: normal Neck:  I cannot assess JVD at 90  Cardiac:  normal S1, S2;  RRR; no murmur Lungs:   clear to auscultation bilaterally, no wheezing, rhonchi or rales Abd: soft, nontender, no hepatomegaly Ext:  no RLE  edema (Unna boot in place)  Skin: warm and dry Neuro:  CNs 2-12 intact, no focal abnormalities noted  EKG:   NSR, HR 87, normal axis, no ST changes        ASSESSMENT AND PLAN:  DOE (dyspnea on exertion): He certainly has risk factors for coronary artery disease.  Stress testing and cardiac catheterization were avoided in the past given his renal transplant. However, he is now back on hemodialysis. Symptoms overall do sound very suspicious for volume excess. However, he's had little relief with decreased dry weight.   -  Arrange  Lexiscan Myoview to rule out ischemia.   -  Arrange echocardiogram to assess LV and RV function.   -  I will place him on a trial of nitrates with isosorbide 15 mg daily (on nondialysis days).    -  If workup unrevealing, consider referral to pulmonology.  Essential hypertension:  Controlled.  ESRD (end stage renal disease):   He remains on Monday, Wednesday, Friday dialysis .  Disposition:   FU with me or Dr. [name] in 2-3 weeks.    Signed, Versie Starks, MHS 02/18/2014 10:22 AM    [facility] [place], [place], [place]  [address] Phone: [phone]; Fax: [phone]

## 2014-02-25 ENCOUNTER — Ambulatory Visit (HOSPITAL_BASED_OUTPATIENT_CLINIC_OR_DEPARTMENT_OTHER): Payer: Medicare Other | Admitting: Radiology

## 2014-02-25 ENCOUNTER — Ambulatory Visit (HOSPITAL_COMMUNITY): Payer: Medicare Other | Attending: Cardiovascular Disease

## 2014-02-25 ENCOUNTER — Encounter (HOSPITAL_COMMUNITY): Payer: Self-pay | Admitting: *Deleted

## 2014-02-25 ENCOUNTER — Other Ambulatory Visit (HOSPITAL_COMMUNITY): Payer: Medicare Other | Admitting: *Deleted

## 2014-02-25 DIAGNOSIS — N186 End stage renal disease: Secondary | ICD-10-CM | POA: Diagnosis not present

## 2014-02-25 DIAGNOSIS — R0600 Dyspnea, unspecified: Secondary | ICD-10-CM

## 2014-02-25 DIAGNOSIS — R0609 Other forms of dyspnea: Secondary | ICD-10-CM

## 2014-02-25 DIAGNOSIS — I10 Essential (primary) hypertension: Secondary | ICD-10-CM | POA: Insufficient documentation

## 2014-02-25 DIAGNOSIS — E119 Type 2 diabetes mellitus without complications: Secondary | ICD-10-CM | POA: Diagnosis not present

## 2014-02-25 MED ORDER — REGADENOSON 0.4 MG/5ML IV SOLN
0.4000 mg | Freq: Once | INTRAVENOUS | Status: AC
  Administered 2014-02-25: 0.4 mg via INTRAVENOUS

## 2014-02-25 MED ORDER — PERFLUTREN LIPID MICROSPHERE
1.0000 mL | INTRAVENOUS | Status: AC | PRN
  Administered 2014-02-25: 1 mL via INTRAVENOUS

## 2014-02-25 MED ORDER — TECHNETIUM TC 99M SESTAMIBI GENERIC - CARDIOLITE
30.0000 | Freq: Once | INTRAVENOUS | Status: AC | PRN
  Administered 2014-02-25: 30 via INTRAVENOUS

## 2014-02-25 NOTE — Progress Notes (Signed)
2D Echo with Definity completed. There was a concern with using Definity due to s/p kidney transplant.  Dr. Aundra Dubin approved to use Definity. 02/25/2014

## 2014-02-25 NOTE — Progress Notes (Unsigned)
Patient ID: Dale [name], male   DOB: December 25, 1965, 48 y.o.   MRN: [id number] Patient needed definity with stress echo. Patient concerned with involvement of the kidney. Patient has had a kidney transplant. [employer] called to verify the usage of definity in a kidney transplant patient. After 3 phone calls to company I was able to speak to [place]. She was unsure why she got the phone call but would help find out the answer. I was told to call back in 10 mins and speak to [employer]. When [place] contacted, she could not help me. She would find out whom I needed to speak to and that would be 5 minutes. I spoke with Hoyle Sauer. I could not get an definite answer to whether it would be appropriate for this patient. I then contacted Dr. Fransico Him ,DOD. Dr. Radford Pax wanted for me to contact patients primary cardiologist.  Dr. Lauree Chandler paged and not answer.Dr. Suzi Roots Director contacted. Dr. Aundra Dubin consulted in reference to patients status. Orders given by Dr.McLean to give definity.  Patient was given 1cc of definity without any complications. Cindy Esperansa Sarabia,RN

## 2014-02-25 NOTE — Progress Notes (Signed)
[facility] [address] [place], [place] [address] [phone]    Cardiology Nuclear Med Study  Dale [name] is a 48 y.o. male     MRN : [id number]     DOB: 1965-11-13  Procedure Date: 02/25/2014  Nuclear Med Background Indication for Stress Test:  Evaluation for Ischemia History:  MPI ~5 yrs ago in [place] (normal) Cardiac Risk Factors: Hypertension and IDDM  Symptoms:  Chest Pain   Nuclear Pre-Procedure Caffeine/Decaff Intake:  7:00pm NPO After: 7:00pm   Lungs:  clear O2 Sat: 99% on room air. IV 0.9% NS with Angio Cath:  22g  IV Site: L Hand  IV Started by:  Matilde Haymaker, RN  Chest Size (in):  58 Cup Size: n/a  Height: 5\' 8"  (1.727 m)  Weight:  285 lb (129.275 kg)  BMI:  Body mass index is 43.34 kg/(m^2). Tech Comments:  n/a    Nuclear Med Study 1 or 2 day study: 2 day  Stress Test Type:  Lexiscan  Reading MD: n/a  Order Authorizing Provider:  Gennette [facility]  Resting Radionuclide: Technetium 60m Sestamibi  Resting Radionuclide Dose: 33.0 on 03/04/2014 mCi   Stress Radionuclide:  Technetium 88m Sestamibi  Stress Radionuclide Dose: 33.0 on 02/25/2014 mCi           Stress Protocol Rest HR: 86 Stress HR: 91  Rest BP: 149/79 Stress BP: 142/59  Exercise Time (min): n/a METS: n/a           Dose of Adenosine (mg):  n/a Dose of Lexiscan: 0.4 mg  Dose of Atropine (mg): n/a Dose of Dobutamine: n/a mcg/kg/min (at max HR)  Stress Test Technologist: Glade Lloyd, BS-ES  Nuclear Technologist:  Earl Many, CNMT     Rest Procedure:  Myocardial perfusion imaging was performed at rest 45 minutes following the intravenous administration of Technetium 34m Sestamibi. Rest ECG: normal sinus rhythm. Normal EKG.  Stress Procedure:  The patient received IV Lexiscan 0.4 mg over 15-seconds.  Technetium 17m Sestamibi injected at 30-seconds.  Quantitative spect images were obtained after a 45 minute delay.   During the infusion of Lexiscan the patient complained of lightheadedness, heart racing, slight SOB, chest tightness and headache.  These symptoms began to resolve in recovery.  Stress ECG: No significant change from baseline ECG  QPS Raw Data Images:  Normal; no motion artifact; normal heart/lung ratio. Stress Images:  There is a large area of mild decreased uptake affecting the base/mid/apical inferior segments, apical, mid inferolateral segment, and apical lateral segment. These areas are reversible. Rest Images:  Very small area of mild decreased uptake at the mid inferolateral wall. Subtraction (SDS):  There is ischemia at the base/mid/apical inferior segments, apical, and the apical lateral segment. Transient Ischemic Dilatation (Normal <1.22):  1.10 Lung/Heart Ratio (Normal <0.45):  0.29  Quantitative Gated Spect Images QGS EDV:  122 ml QGS ESV:  56 ml  Impression Exercise Capacity:  Lexiscan with no exercise. BP Response:  Normal blood pressure response. Clinical Symptoms:  the patient had shortness of breath and chest tightness. ECG Impression:  No significant ST segment change suggestive of ischemia. Comparison with Prior Nuclear Study: No images to compare  Overall Impression:  This study is abnormal. This is a moderate risk scan. There is mild ischemia in a moderate sized  area affecting the entire inferior wall, the apical cap,  and the apical lateral segment. There may be a mild wall motion abnormality.  Wall  motion:  The EF is 56%. There is mild relative hypokinesis of the inferior wall.  Dola Argyle, MD

## 2014-02-26 ENCOUNTER — Encounter: Payer: Self-pay | Admitting: Physician Assistant

## 2014-02-27 ENCOUNTER — Telehealth: Payer: Self-pay | Admitting: *Deleted

## 2014-02-27 NOTE — Telephone Encounter (Signed)
pt notified about echo results, he states he saw results on [place] as well.

## 2014-03-04 ENCOUNTER — Ambulatory Visit (HOSPITAL_COMMUNITY): Payer: Medicare Other | Attending: Cardiology

## 2014-03-04 DIAGNOSIS — R0989 Other specified symptoms and signs involving the circulatory and respiratory systems: Secondary | ICD-10-CM

## 2014-03-04 MED ORDER — TECHNETIUM TC 99M SESTAMIBI GENERIC - CARDIOLITE
30.0000 | Freq: Once | INTRAVENOUS | Status: AC | PRN
  Administered 2014-03-04: 30 via INTRAVENOUS

## 2014-03-05 ENCOUNTER — Encounter: Payer: Self-pay | Admitting: Physician Assistant

## 2014-03-06 ENCOUNTER — Encounter: Payer: Self-pay | Admitting: *Deleted

## 2014-03-06 ENCOUNTER — Encounter: Payer: Self-pay | Admitting: Physician Assistant

## 2014-03-06 ENCOUNTER — Ambulatory Visit (INDEPENDENT_AMBULATORY_CARE_PROVIDER_SITE_OTHER): Payer: Medicare Other | Admitting: Physician Assistant

## 2014-03-06 VITALS — BP 140/74 | HR 89 | Ht 68.0 in | Wt 285.0 lb

## 2014-03-06 DIAGNOSIS — R931 Abnormal findings on diagnostic imaging of heart and coronary circulation: Secondary | ICD-10-CM

## 2014-03-06 DIAGNOSIS — I5032 Chronic diastolic (congestive) heart failure: Secondary | ICD-10-CM

## 2014-03-06 DIAGNOSIS — R0600 Dyspnea, unspecified: Secondary | ICD-10-CM

## 2014-03-06 DIAGNOSIS — R9439 Abnormal result of other cardiovascular function study: Secondary | ICD-10-CM

## 2014-03-06 DIAGNOSIS — I10 Essential (primary) hypertension: Secondary | ICD-10-CM

## 2014-03-06 DIAGNOSIS — N186 End stage renal disease: Secondary | ICD-10-CM

## 2014-03-06 DIAGNOSIS — R0609 Other forms of dyspnea: Secondary | ICD-10-CM

## 2014-03-06 MED ORDER — ISOSORBIDE MONONITRATE ER 30 MG PO TB24: 30.0000 mg | ORAL_TABLET | Freq: Every day | ORAL | Status: DC

## 2014-03-06 MED ORDER — PREDNISONE 20 MG PO TABS: 60.0000 mg | ORAL_TABLET | ORAL | Status: DC

## 2014-03-06 NOTE — Progress Notes (Signed)
Cardiology Office Note   Date:  03/06/2014   ID:  Dale [name], DOB 1966/02/18, MRN [id number]  PCP:  Windy Kalata, MD  Cardiologist:  Dr. Lauree Chandler     History of Present Illness: Dale [name] is a 48 y.o. male with a history of diabetes, HTN, sleep apnea, ESRD, fibromyalgia, chronic anemia, status post left BKA. He underwent renal transplant and had been off of dialysis.  He was seen by Dr. Lauree Chandler in 05/2012 in the hospital for chest pain. Diagnostic options were limited as cardiac catheterization would have been detrimental to his transplanted kidney. Therefore, stress testing was not thought to be helpful. Medical therapy was recommended. Further ischemic evaluation could be pursued if he had recurrent symptoms.   Admitted 06/2012 with severe septic shock secondary to Salmonella bacteremia.  He developed worsening renal function and hemodialysis was resumed.  I saw him 10/27 for further evaluation of dyspnea with exertion. He denied chest discomfort. He had no improvement with reduced dry weight.  Echo was obtained and this demonstrated normal LVF and mild diastolic dysfunction.  A myoview was obtained and this was abnormal with inferior, apical and apical lateral ischemia (mod risk).  I have already reviewed this with Dr. Lauree Chandler.  Cardiac catheterization has been recommended.  He returns for FU.  He is here with his wife.  He notes less DOE since starting the Imdur.  He is having a hard time breaking it in half.  He denies chest pain, orthopnea, PND.  RLE edema is stable.  No syncope.     Studies:   - Echocardiogram (11/15):  Mild LVH, EF 60-65%, Gr 1 DD, mild LAE, mild RAE, normal RVF  - Nuclear (11/15):  This study is abnormal. This is a moderate risk scan. There is mild ischemia in a moderate sized area affecting the entire inferior wall, the apical cap,and the apical lateral segment. There may be a mild wall motion abnormality. Wall  motion: The EF is 56%. There is mild relative hypokinesis of the inferior wall.   Recent Labs/Images:  Recent Labs  06/17/13 0625 11/01/13 0210  NA 132* 138  K 4.4 5.1  BUN 55* 57*  CREATININE 7.44* 7.35*  ALT 15  --   HGB 10.1* 11.5*       Wt Readings from Last 3 Encounters:  02/25/14 285 lb (129.275 kg)  02/18/14 285 lb (129.275 kg)  11/01/13 275 lb (124.739 kg)     Past Medical History  Diagnosis Date  . Insulin dependent diabetes mellitus   . Hypertension   . History of blood transfusion   . ESRD (end stage renal disease)     a. 1995 s/p cadaveric transplant w/ susbequent failure after 18 yrs;  b.Dialysis initiated 07/2011  . Dyspnea     a. 07/23/11 Echo: EF 55-60%  . Anemia of chronic disease   . Cholecystitis     a. 08/27/2011  . Constipation   . Pericardial effusion     a.  Small by CT 08/22/11;  b.  Large by CT 08/27/11  . Inguinal lymphadenopathy     a. bilateral - s/p biopsy 07/2011  . Fibromyalgia   . Hx of echocardiogram     Echo (11/15):  Mild LVH, EF 60-65%, no RWMA, Gr 1 DD, MAC, mild LAE, normal RVF, mild RAE  . Hx of cardiovascular stress test     Lexiscan Myoview (11/15):  Inf, apical cap and apical lateral ischemia, EF 56%, inf HK,  Moderate Risk    Current Outpatient Prescriptions  Medication Sig Dispense Refill  . allopurinol (ZYLOPRIM) 100 MG tablet Take 200 mg by mouth daily.     [name] Kitchen aspirin 81 MG tablet Take 81 mg by mouth every evening.     [name] Kitchen atorvastatin (LIPITOR) 10 MG tablet Take 10 mg by mouth every evening.     . calcium acetate (PHOSLO) 667 MG capsule Take 2,001 mg by mouth 3 (three) times daily with meals.     . diphenhydrAMINE (BENADRYL) 25 mg capsule Take 50 mg by mouth at bedtime as needed for itching or sleep.    [name] Kitchen docusate sodium (COLACE) 100 MG capsule Take 200 mg by mouth 3 (three) times daily.    . furosemide (LASIX) 80 MG tablet Take 160 mg by mouth 2 (two) times daily.     [name] Kitchen GLUCAGON EMERGENCY 1 MG injection Inject 1 mg into the  muscle daily as needed (low blood sugar).     . hydrocortisone cream 1 % Apply 1 application topically daily as needed for itching.    [name] Kitchen HYDROmorphone (DILAUDID) 2 MG tablet Take 2 mg by mouth 3 (three) times daily as needed for severe pain.     . Insulin Human (INSULIN PUMP) 100 unit/ml SOLN Inject into the skin continuous. Humalog    . isosorbide mononitrate (IMDUR) 30 MG 24 hr tablet Take 0.5 tablets (15 mg total) by mouth daily. 30 tablet 11  . levothyroxine (SYNTHROID, LEVOTHROID) 100 MCG tablet Take 100 mcg by mouth daily before breakfast.    . loratadine (CLARITIN) 10 MG tablet Take 10 mg by mouth daily.    . metoCLOPramide (REGLAN) 5 MG tablet Take 5 mg by mouth 4 (four) times daily - after meals and at bedtime.    . nitroGLYCERIN (NITROSTAT) 0.4 MG SL tablet Place 0.4 mg under the tongue every 5 (five) minutes as needed for chest pain.     . promethazine (PHENERGAN) 25 MG tablet Take 25 mg by mouth Every 6 hours as needed for nausea or vomiting. For nausea    . ranitidine (ZANTAC) 150 MG tablet Take 150 mg by mouth 2 (two) times daily.    [name] Kitchen rOPINIRole (REQUIP) 2 MG tablet Take 2 mg by mouth every evening.     No current facility-administered medications for this visit.     Allergies:   Doxycycline; Hydrocodone; Morphine and related; Amoxicillin; Ciprofloxacin; Clindamycin/lincomycin; Codeine; Levofloxacin; Vasotec; Contrast media; and Oxycodone   Social History:  The patient  reports that he has never smoked. He has never used smokeless tobacco. He reports that he drinks alcohol. He reports that he does not use illicit drugs.   Family History:  [name] family history includes Breast cancer in his maternal aunt; Cancer in his father and maternal aunt; Colon polyps in his father; Diabetes in his father; Hypertension in his father, father, and mother. There is no history of Malignant hyperthermia, Heart attack, or Stroke.   ROS:  Please see the history of present illness.      All  other systems reviewed and negative.    PHYSICAL EXAM: VS:  BP 140/74 mmHg  Pulse 89  Ht 5\' 8"  (1.727 m)  Wt 285 lb (129.275 kg)  BMI 43.34 kg/m2 Well nourished, well developed, in no acute distress HEENT: normal Neck:  I cannot assess JVD at 90  Cardiac:  normal S1, S2;  RRR; no murmur Lungs:   clear to auscultation bilaterally, no wheezing, rhonchi or rales Abd: soft, nontender,  no hepatomegaly Ext:  no RLE  Edema; LLE prosthesis in place Skin: warm and dry Neuro:  CNs 2-12 intact, no focal abnormalities noted  EKG:   NSR, HR 89, normal axis, NSSTTW changes    ASSESSMENT AND PLAN:  1.  DOE (dyspnea on exertion):  The Myoview was abnormal with inferior ischemia.  His dyspnea is likely his anginal equivalent.  As noted, I reviewed this previously with Dr. Lauree Chandler and the recommendation is to proceed with cardiac catheterization.  Risks and benefits of cardiac catheterization have been discussed with the patient.  These include bleeding, infection, kidney damage, stroke, heart attack, death.  The patient understands these risks and is willing to proceed.     -  He has been intol of beta blockers in the past due to hypotension.  So, I will hold off on starting a BB.    -  He is allergic to IV dye.  He will be pretreated with prednisone, benadryl, pepcid.    -  Proceed with LHC 11/25 with Dr. Lauree Chandler.      -  Increase Imdur to 30 mg QD.    -  Will likely need L FA access as he has had RLE dialysis access in the past. 2.  Essential hypertension:   Controlled. 3.  ESRD (end stage renal disease):   He remains on Monday, Wednesday, Friday dialysis.  The week of Thanksgiving, he will be on Sun, Tues, Fri dialysis (this is the week of his cardiac cath).   4.  Chronic Diastolic CHF:  Volume management per dialysis.    5.  Diabetes Mellitus:  He will need to adjust his insulin pump while NPO for his cath.   Disposition:   FU with Dr. [name] or me 2 weeks after  LHC.    Signed, Versie Starks, MHS 03/06/2014 2:59 PM    [address], [place], [place]  [address] Phone: [phone]; Fax: [phone]

## 2014-03-06 NOTE — H&P (Signed)
History and Physical   Date:  03/06/2014   ID:  Dale [name], DOB 04-19-1966, MRN [id number]  PCP:  Windy Kalata, MD  Cardiologist:  Dr. Lauree Chandler     History of Present Illness: Dale [name] is a 48 y.o. male with a history of diabetes, HTN, sleep apnea, ESRD, fibromyalgia, chronic anemia, status post left BKA. He underwent renal transplant and had been off of dialysis.  He was seen by Dr. Lauree Chandler in 05/2012 in the hospital for chest pain. Diagnostic options were limited as cardiac catheterization would have been detrimental to his transplanted kidney. Therefore, stress testing was not thought to be helpful. Medical therapy was recommended. Further ischemic evaluation could be pursued if he had recurrent symptoms.   Admitted 06/2012 with severe septic shock secondary to Salmonella bacteremia.  He developed worsening renal function and hemodialysis was resumed.  I saw him 10/27 for further evaluation of dyspnea with exertion. He denied chest discomfort. He had no improvement with reduced dry weight.  Echo was obtained and this demonstrated normal LVF and mild diastolic dysfunction.  A myoview was obtained and this was abnormal with inferior, apical and apical lateral ischemia (mod risk).  I have already reviewed this with Dr. Lauree Chandler.  Cardiac catheterization has been recommended.  He returns for FU.  He is here with his wife.  He notes less DOE since starting the Imdur.  He is having a hard time breaking it in half.  He denies chest pain, orthopnea, PND.  RLE edema is stable.  No syncope.     Studies:   - Echocardiogram (11/15):  Mild LVH, EF 60-65%, Gr 1 DD, mild LAE, mild RAE, normal RVF  - Nuclear (11/15):  This study is abnormal. This is a moderate risk scan. There is mild ischemia in a moderate sized area affecting the entire inferior wall, the apical cap,and the apical lateral segment. There may be a mild wall motion abnormality. Wall  motion: The EF is 56%. There is mild relative hypokinesis of the inferior wall.   Recent Labs/Images:  Recent Labs  06/17/13 0625 11/01/13 0210  NA 132* 138  K 4.4 5.1  BUN 55* 57*  CREATININE 7.44* 7.35*  ALT 15  --   HGB 10.1* 11.5*       Wt Readings from Last 3 Encounters:  02/25/14 285 lb (129.275 kg)  02/18/14 285 lb (129.275 kg)  11/01/13 275 lb (124.739 kg)     Past Medical History  Diagnosis Date  . Insulin dependent diabetes mellitus   . Hypertension   . History of blood transfusion   . ESRD (end stage renal disease)     a. 1995 s/p cadaveric transplant w/ susbequent failure after 18 yrs;  b.Dialysis initiated 07/2011  . Dyspnea     a. 07/23/11 Echo: EF 55-60%  . Anemia of chronic disease   . Cholecystitis     a. 08/27/2011  . Constipation   . Pericardial effusion     a.  Small by CT 08/22/11;  b.  Large by CT 08/27/11  . Inguinal lymphadenopathy     a. bilateral - s/p biopsy 07/2011  . Fibromyalgia   . Hx of echocardiogram     Echo (11/15):  Mild LVH, EF 60-65%, no RWMA, Gr 1 DD, MAC, mild LAE, normal RVF, mild RAE  . Hx of cardiovascular stress test     Lexiscan Myoview (11/15):  Inf, apical cap and apical lateral ischemia, EF 56%, inf HK,  Moderate Risk    Current Outpatient Prescriptions  Medication Sig Dispense Refill  . allopurinol (ZYLOPRIM) 100 MG tablet Take 200 mg by mouth daily.     [name] Kitchen aspirin 81 MG tablet Take 81 mg by mouth every evening.     [name] Kitchen atorvastatin (LIPITOR) 10 MG tablet Take 10 mg by mouth every evening.     . calcium acetate (PHOSLO) 667 MG capsule Take 2,001 mg by mouth 3 (three) times daily with meals.     . diphenhydrAMINE (BENADRYL) 25 mg capsule Take 50 mg by mouth at bedtime as needed for itching or sleep.    [name] Kitchen docusate sodium (COLACE) 100 MG capsule Take 200 mg by mouth 3 (three) times daily.    . furosemide (LASIX) 80 MG tablet Take 160 mg by mouth 2 (two) times daily.     [name] Kitchen GLUCAGON EMERGENCY 1 MG injection Inject 1 mg into the  muscle daily as needed (low blood sugar).     . hydrocortisone cream 1 % Apply 1 application topically daily as needed for itching.    [name] Kitchen HYDROmorphone (DILAUDID) 2 MG tablet Take 2 mg by mouth 3 (three) times daily as needed for severe pain.     . Insulin Human (INSULIN PUMP) 100 unit/ml SOLN Inject into the skin continuous. Humalog    . isosorbide mononitrate (IMDUR) 30 MG 24 hr tablet Take 0.5 tablets (15 mg total) by mouth daily. 30 tablet 11  . levothyroxine (SYNTHROID, LEVOTHROID) 100 MCG tablet Take 100 mcg by mouth daily before breakfast.    . loratadine (CLARITIN) 10 MG tablet Take 10 mg by mouth daily.    . metoCLOPramide (REGLAN) 5 MG tablet Take 5 mg by mouth 4 (four) times daily - after meals and at bedtime.    . nitroGLYCERIN (NITROSTAT) 0.4 MG SL tablet Place 0.4 mg under the tongue every 5 (five) minutes as needed for chest pain.     . promethazine (PHENERGAN) 25 MG tablet Take 25 mg by mouth Every 6 hours as needed for nausea or vomiting. For nausea    . ranitidine (ZANTAC) 150 MG tablet Take 150 mg by mouth 2 (two) times daily.    [name] Kitchen rOPINIRole (REQUIP) 2 MG tablet Take 2 mg by mouth every evening.     No current facility-administered medications for this visit.     Allergies:   Doxycycline; Hydrocodone; Morphine and related; Amoxicillin; Ciprofloxacin; Clindamycin/lincomycin; Codeine; Levofloxacin; Vasotec; Contrast media; and Oxycodone   Social History:  The patient  reports that he has never smoked. He has never used smokeless tobacco. He reports that he drinks alcohol. He reports that he does not use illicit drugs.   Family History:  [name] family history includes Breast cancer in his maternal aunt; Cancer in his father and maternal aunt; Colon polyps in his father; Diabetes in his father; Hypertension in his father, father, and mother. There is no history of Malignant hyperthermia, Heart attack, or Stroke.   ROS:  Please see the history of present illness.      All  other systems reviewed and negative.    PHYSICAL EXAM: VS:  BP 140/74 mmHg  Pulse 89  Ht 5\' 8"  (1.727 m)  Wt 285 lb (129.275 kg)  BMI 43.34 kg/m2 Well nourished, well developed, in no acute distress HEENT: normal Neck:  I cannot assess JVD at 90  Cardiac:  normal S1, S2;  RRR; no murmur Lungs:   clear to auscultation bilaterally, no wheezing, rhonchi or rales Abd: soft, nontender,  no hepatomegaly Ext:  no RLE  Edema; LLE prosthesis in place Skin: warm and dry Neuro:  CNs 2-12 intact, no focal abnormalities noted  EKG:   NSR, HR 89, normal axis, NSSTTW changes    ASSESSMENT AND PLAN:  1.  DOE (dyspnea on exertion):  The Myoview was abnormal with inferior ischemia.  His dyspnea is likely his anginal equivalent.  As noted, I reviewed this previously with Dr. Lauree Chandler and the recommendation is to proceed with cardiac catheterization.  Risks and benefits of cardiac catheterization have been discussed with the patient.  These include bleeding, infection, kidney damage, stroke, heart attack, death.  The patient understands these risks and is willing to proceed.     -  He has been intol of beta blockers in the past due to hypotension.  So, I will hold off on starting a BB.    -  He is allergic to IV dye.  He will be pretreated with prednisone, benadryl, pepcid.    -  Proceed with LHC 11/25 with Dr. Lauree Chandler.      -  Increase Imdur to 30 mg QD.    -  Will likely need L FA access as he has had RLE dialysis access in the past. 2.  Essential hypertension:   Controlled. 3.  ESRD (end stage renal disease):   He remains on Monday, Wednesday, Friday dialysis.  The week of Thanksgiving, he will be on Sun, Tues, Fri dialysis (this is the week of his cardiac cath).   4.  Chronic Diastolic CHF:  Volume management per dialysis.    5.  Diabetes Mellitus:  He will need to adjust his insulin pump while NPO for his cath.   Disposition:   FU with Dr. [name] or me 2 weeks after  LHC.    Signed, Versie Starks, MHS 03/06/2014 2:59 PM    [address], [place], [place]  [address] Phone: [phone]; Fax: [phone]

## 2014-03-06 NOTE — Patient Instructions (Addendum)
Your physician has requested that you have a cardiac catheterization. Cardiac catheterization is used to diagnose and/or treat various heart conditions. Doctors may recommend this procedure for a number of different reasons. The most common reason is to evaluate chest pain. Chest pain can be a symptom of coronary artery disease (CAD), and cardiac catheterization can show whether plaque is narrowing or blocking your heart's arteries. This procedure is also used to evaluate the valves, as well as measure the blood flow and oxygen levels in different parts of your heart. For further information please visit [email or web address]. Please follow instruction sheet, as given.  LAB WORK TODAY; BMET, CBC W/DIFF, PT/INR  AN RX FOR PREDNISONE WAS SENT IN FOR YOUR CATH; FOLLOW THESE INSTRUCTIONS  1. TAKE PREDNISONE 60 MG AT 6 PM THE DAY BEFORE CATH 2. TAKE PREDNISONE 60 MG AT 12 AM IN THE DAY BEFORE CATH 3. TAKE PREDNISONE 60 MG BY 6 AM DAY OF CATH  INCREASE IMDUR TO 30 MG DAILY  YOU WILL NEED A FOLLOW UP WITH SCOTT WEAVER, [facility] ON 04/03/14 @ 3:40 POST CATH

## 2014-03-07 ENCOUNTER — Telehealth: Payer: Self-pay | Admitting: *Deleted

## 2014-03-07 LAB — BASIC METABOLIC PANEL
BUN: 46 mg/dL — ABNORMAL HIGH (ref 6–23)
CO2: 24 mEq/L (ref 19–32)
Calcium: 9.4 mg/dL (ref 8.4–10.5)
Chloride: 103 mEq/L (ref 96–112)
Creatinine, Ser: 6 mg/dL (ref 0.4–1.5)
GFR: 10.71 mL/min — CL (ref >60.00)
Glucose, Bld: 188 mg/dL — ABNORMAL HIGH (ref 70–99)
Potassium: 5 mEq/L (ref 3.5–5.1)
Sodium: 139 mEq/L (ref 135–145)

## 2014-03-07 LAB — CBC WITH DIFFERENTIAL/PLATELET
Basophils Absolute: 0 10*3/uL (ref 0.0–0.1)
Basophils Relative: 0.5 % (ref 0.0–3.0)
Eosinophils Absolute: 0.4 10*3/uL (ref 0.0–0.7)
Eosinophils Relative: 6 % — ABNORMAL HIGH (ref 0.0–5.0)
HCT: 33.4 % — ABNORMAL LOW (ref 39.0–52.0)
Hemoglobin: 10.9 g/dL — ABNORMAL LOW (ref 13.0–17.0)
Lymphocytes Relative: 20.9 % (ref 12.0–46.0)
Lymphs Abs: 1.4 10*3/uL (ref 0.7–4.0)
MCHC: 32.6 g/dL (ref 30.0–36.0)
MCV: 99.6 fl (ref 78.0–100.0)
Monocytes Absolute: 0.6 10*3/uL (ref 0.1–1.0)
Monocytes Relative: 8.8 % (ref 3.0–12.0)
Neutro Abs: 4.2 10*3/uL (ref 1.4–7.7)
Neutrophils Relative %: 63.8 % (ref 43.0–77.0)
Platelets: 230 10*3/uL (ref 150.0–400.0)
RBC: 3.35 Mil/uL — ABNORMAL LOW (ref 4.22–5.81)
RDW: 15.6 % — ABNORMAL HIGH (ref 11.5–15.5)
WBC: 6.6 10*3/uL (ref 4.0–10.5)

## 2014-03-07 LAB — PROTIME-INR
INR: 1.1 ratio — ABNORMAL HIGH (ref 0.8–1.0)
Prothrombin Time: 12.4 s (ref 9.6–13.1)

## 2014-03-07 NOTE — Telephone Encounter (Signed)
pt notified about labs ok for cath. pt verbalaized understanding.

## 2014-03-19 ENCOUNTER — Encounter (HOSPITAL_COMMUNITY): Payer: Self-pay | Admitting: General Practice

## 2014-03-19 ENCOUNTER — Other Ambulatory Visit: Payer: Self-pay

## 2014-03-19 ENCOUNTER — Inpatient Hospital Stay (HOSPITAL_COMMUNITY)
Admission: RE | Admit: 2014-03-19 | Discharge: 2014-03-20 | DRG: 250 | Disposition: A | Discharge status: Home / Self Care | Payer: Medicare Other | Source: Ambulatory Visit | Attending: Cardiovascular Disease | Admitting: Cardiovascular Disease

## 2014-03-19 ENCOUNTER — Encounter (HOSPITAL_COMMUNITY)
Admission: RE | Disposition: A | Discharge status: Home / Self Care | Payer: Medicare Other | Source: Ambulatory Visit | Attending: Cardiovascular Disease

## 2014-03-19 DIAGNOSIS — K219 Gastro-esophageal reflux disease without esophagitis: Secondary | ICD-10-CM | POA: Diagnosis present

## 2014-03-19 DIAGNOSIS — E780 Pure hypercholesterolemia: Secondary | ICD-10-CM | POA: Diagnosis present

## 2014-03-19 DIAGNOSIS — R21 Rash and other nonspecific skin eruption: Secondary | ICD-10-CM | POA: Diagnosis not present

## 2014-03-19 DIAGNOSIS — Z79899 Other long term (current) drug therapy: Secondary | ICD-10-CM | POA: Diagnosis not present

## 2014-03-19 DIAGNOSIS — Z89512 Acquired absence of left leg below knee: Secondary | ICD-10-CM | POA: Diagnosis not present

## 2014-03-19 DIAGNOSIS — Z91041 Radiographic dye allergy status: Secondary | ICD-10-CM

## 2014-03-19 DIAGNOSIS — E1121 Type 2 diabetes mellitus with diabetic nephropathy: Secondary | ICD-10-CM

## 2014-03-19 DIAGNOSIS — R0609 Other forms of dyspnea: Secondary | ICD-10-CM | POA: Diagnosis present

## 2014-03-19 DIAGNOSIS — M797 Fibromyalgia: Secondary | ICD-10-CM | POA: Diagnosis present

## 2014-03-19 DIAGNOSIS — F40240 Claustrophobia: Secondary | ICD-10-CM | POA: Diagnosis present

## 2014-03-19 DIAGNOSIS — L298 Other pruritus: Secondary | ICD-10-CM | POA: Diagnosis not present

## 2014-03-19 DIAGNOSIS — Z833 Family history of diabetes mellitus: Secondary | ICD-10-CM | POA: Diagnosis not present

## 2014-03-19 DIAGNOSIS — I5032 Chronic diastolic (congestive) heart failure: Secondary | ICD-10-CM | POA: Diagnosis present

## 2014-03-19 DIAGNOSIS — D638 Anemia in other chronic diseases classified elsewhere: Secondary | ICD-10-CM | POA: Diagnosis present

## 2014-03-19 DIAGNOSIS — I25110 Atherosclerotic heart disease of native coronary artery with unstable angina pectoris: Secondary | ICD-10-CM | POA: Diagnosis present

## 2014-03-19 DIAGNOSIS — R0600 Dyspnea, unspecified: Secondary | ICD-10-CM

## 2014-03-19 DIAGNOSIS — I2584 Coronary atherosclerosis due to calcified coronary lesion: Secondary | ICD-10-CM | POA: Diagnosis present

## 2014-03-19 DIAGNOSIS — Z940 Kidney transplant status: Secondary | ICD-10-CM

## 2014-03-19 DIAGNOSIS — I10 Essential (primary) hypertension: Secondary | ICD-10-CM | POA: Diagnosis present

## 2014-03-19 DIAGNOSIS — Z794 Long term (current) use of insulin: Secondary | ICD-10-CM | POA: Diagnosis not present

## 2014-03-19 DIAGNOSIS — E119 Type 2 diabetes mellitus without complications: Secondary | ICD-10-CM | POA: Diagnosis present

## 2014-03-19 DIAGNOSIS — R9439 Abnormal result of other cardiovascular function study: Secondary | ICD-10-CM

## 2014-03-19 DIAGNOSIS — Z89519 Acquired absence of unspecified leg below knee: Secondary | ICD-10-CM

## 2014-03-19 DIAGNOSIS — E039 Hypothyroidism, unspecified: Secondary | ICD-10-CM | POA: Diagnosis present

## 2014-03-19 DIAGNOSIS — Z7902 Long term (current) use of antithrombotics/antiplatelets: Secondary | ICD-10-CM | POA: Diagnosis not present

## 2014-03-19 DIAGNOSIS — Z803 Family history of malignant neoplasm of breast: Secondary | ICD-10-CM

## 2014-03-19 DIAGNOSIS — Z885 Allergy status to narcotic agent status: Secondary | ICD-10-CM

## 2014-03-19 DIAGNOSIS — N186 End stage renal disease: Secondary | ICD-10-CM | POA: Diagnosis present

## 2014-03-19 DIAGNOSIS — I120 Hypertensive chronic kidney disease with stage 5 chronic kidney disease or end stage renal disease: Secondary | ICD-10-CM | POA: Diagnosis present

## 2014-03-19 DIAGNOSIS — Z881 Allergy status to other antibiotic agents status: Secondary | ICD-10-CM

## 2014-03-19 DIAGNOSIS — Z888 Allergy status to other drugs, medicaments and biological substances status: Secondary | ICD-10-CM

## 2014-03-19 DIAGNOSIS — G4733 Obstructive sleep apnea (adult) (pediatric): Secondary | ICD-10-CM | POA: Diagnosis present

## 2014-03-19 DIAGNOSIS — Z8249 Family history of ischemic heart disease and other diseases of the circulatory system: Secondary | ICD-10-CM | POA: Diagnosis not present

## 2014-03-19 DIAGNOSIS — I2510 Atherosclerotic heart disease of native coronary artery without angina pectoris: Secondary | ICD-10-CM

## 2014-03-19 DIAGNOSIS — I214 Non-ST elevation (NSTEMI) myocardial infarction: Secondary | ICD-10-CM | POA: Diagnosis present

## 2014-03-19 DIAGNOSIS — T50995A Adverse effect of other drugs, medicaments and biological substances, initial encounter: Secondary | ICD-10-CM

## 2014-03-19 DIAGNOSIS — Z7982 Long term (current) use of aspirin: Secondary | ICD-10-CM

## 2014-03-19 DIAGNOSIS — I200 Unstable angina: Secondary | ICD-10-CM

## 2014-03-19 HISTORY — DX: Hypothyroidism, unspecified: E03.9

## 2014-03-19 HISTORY — PX: CARDIAC CATHETERIZATION: SHX172

## 2014-03-19 HISTORY — DX: Pneumonia, unspecified organism: J18.9

## 2014-03-19 HISTORY — DX: Atherosclerotic heart disease of native coronary artery without angina pectoris: I25.10

## 2014-03-19 HISTORY — DX: Claustrophobia: F40.240

## 2014-03-19 HISTORY — DX: Unspecified osteoarthritis, unspecified site: M19.90

## 2014-03-19 HISTORY — DX: Gastro-esophageal reflux disease without esophagitis: K21.9

## 2014-03-19 HISTORY — DX: Obstructive sleep apnea (adult) (pediatric): G47.33

## 2014-03-19 HISTORY — PX: LEFT HEART CATHETERIZATION WITH CORONARY ANGIOGRAM: SHX5451

## 2014-03-19 LAB — POCT ACTIVATED CLOTTING TIME: Activated Clotting Time: 433 seconds

## 2014-03-19 LAB — GLUCOSE, CAPILLARY
Glucose-Capillary: 406 mg/dL — ABNORMAL HIGH (ref 70–99)
Glucose-Capillary: 376 mg/dL — ABNORMAL HIGH (ref 70–99)
Glucose-Capillary: 351 mg/dL — ABNORMAL HIGH (ref 70–99)
Glucose-Capillary: 281 mg/dL — ABNORMAL HIGH (ref 70–99)
Glucose-Capillary: 354 mg/dL — ABNORMAL HIGH (ref 70–99)
Glucose-Capillary: 281 mg/dL — ABNORMAL HIGH (ref 70–99)

## 2014-03-19 LAB — MRSA PCR SCREENING: MRSA by PCR: NEGATIVE

## 2014-03-19 SURGERY — LEFT HEART CATHETERIZATION WITH CORONARY ANGIOGRAM
Anesthesia: LOCAL

## 2014-03-19 MED ORDER — FAMOTIDINE IN NACL 20-0.9 MG/50ML-% IV SOLN
INTRAVENOUS | Status: AC
  Administered 2014-03-19: 20 mg via INTRAVENOUS
  Filled 2014-03-19: qty 50

## 2014-03-19 MED ORDER — DIPHENHYDRAMINE HCL 50 MG/ML IJ SOLN
INTRAMUSCULAR | Status: AC
  Administered 2014-03-19: 25 mg via INTRAVENOUS
  Filled 2014-03-19: qty 1

## 2014-03-19 MED ORDER — PREDNISONE 20 MG PO TABS
ORAL_TABLET | ORAL | Status: AC
  Administered 2014-03-19: 60 mg
  Filled 2014-03-19: qty 3

## 2014-03-19 MED ORDER — INSULIN PUMP
Freq: Three times a day (TID) | SUBCUTANEOUS | Status: DC
  Administered 2014-03-19 – 2014-03-20 (×5): via SUBCUTANEOUS
  Filled 2014-03-19: qty 1

## 2014-03-19 MED ORDER — MIDAZOLAM HCL 2 MG/2ML IJ SOLN
INTRAMUSCULAR | Status: AC
  Filled 2014-03-19: qty 2

## 2014-03-19 MED ORDER — SODIUM CHLORIDE 0.9 % IJ SOLN: 3.0000 mL | INTRAMUSCULAR | Status: DC | PRN

## 2014-03-19 MED ORDER — METOCLOPRAMIDE HCL 5 MG PO TABS
5.0000 mg | ORAL_TABLET | Freq: Three times a day (TID) | ORAL | Status: DC
  Administered 2014-03-19 – 2014-03-20 (×3): 5 mg via ORAL
  Filled 2014-03-19 (×7): qty 1

## 2014-03-19 MED ORDER — LIDOCAINE HCL (PF) 1 % IJ SOLN
INTRAMUSCULAR | Status: AC
  Filled 2014-03-19: qty 30

## 2014-03-19 MED ORDER — FENTANYL CITRATE 0.05 MG/ML IJ SOLN
INTRAMUSCULAR | Status: AC
  Filled 2014-03-19: qty 2

## 2014-03-19 MED ORDER — FAMOTIDINE IN NACL 20-0.9 MG/50ML-% IV SOLN
20.0000 mg | INTRAVENOUS | Status: AC
  Administered 2014-03-19: 20 mg via INTRAVENOUS

## 2014-03-19 MED ORDER — VERAPAMIL HCL 2.5 MG/ML IV SOLN
INTRAVENOUS | Status: AC
  Filled 2014-03-19: qty 2

## 2014-03-19 MED ORDER — LEVOTHYROXINE SODIUM 100 MCG PO TABS
100.0000 ug | ORAL_TABLET | Freq: Every day | ORAL | Status: DC
  Administered 2014-03-20: 100 ug via ORAL
  Filled 2014-03-19 (×2): qty 1

## 2014-03-19 MED ORDER — HEPARIN (PORCINE) IN NACL 2-0.9 UNIT/ML-% IJ SOLN
INTRAMUSCULAR | Status: AC
  Filled 2014-03-19: qty 1000

## 2014-03-19 MED ORDER — ALLOPURINOL 100 MG PO TABS
200.0000 mg | ORAL_TABLET | Freq: Every day | ORAL | Status: DC
  Administered 2014-03-19: 200 mg via ORAL
  Filled 2014-03-19 (×2): qty 2

## 2014-03-19 MED ORDER — SODIUM CHLORIDE 0.9 % IJ SOLN: 3.0000 mL | Freq: Two times a day (BID) | INTRAMUSCULAR | Status: DC

## 2014-03-19 MED ORDER — INSULIN PUMP
SUBCUTANEOUS | Status: DC
  Filled 2014-03-19: qty 1

## 2014-03-19 MED ORDER — CALCIUM ACETATE 667 MG PO CAPS
1334.0000 mg | ORAL_CAPSULE | ORAL | Status: DC
  Filled 2014-03-19: qty 3

## 2014-03-19 MED ORDER — CLOPIDOGREL BISULFATE 75 MG PO TABS: 75.0000 mg | ORAL_TABLET | Freq: Every day | ORAL | Status: DC

## 2014-03-19 MED ORDER — HEPARIN (PORCINE) IN NACL 100-0.45 UNIT/ML-% IJ SOLN
1500.0000 [IU]/h | INTRAMUSCULAR | Status: DC
  Administered 2014-03-19: 1200 [IU]/h via INTRAVENOUS
  Filled 2014-03-19 (×2): qty 250

## 2014-03-19 MED ORDER — HYDROMORPHONE HCL 1 MG/ML IJ SOLN
1.0000 mg | INTRAMUSCULAR | Status: DC | PRN
  Administered 2014-03-19 – 2014-03-20 (×3): 1 mg via INTRAVENOUS
  Filled 2014-03-19 (×3): qty 1

## 2014-03-19 MED ORDER — HYDRALAZINE HCL 20 MG/ML IJ SOLN
INTRAMUSCULAR | Status: AC
  Filled 2014-03-19: qty 1

## 2014-03-19 MED ORDER — PREDNISONE 50 MG PO TABS
60.0000 mg | ORAL_TABLET | Freq: Once | ORAL | Status: DC
  Filled 2014-03-19: qty 1

## 2014-03-19 MED ORDER — ASPIRIN 81 MG PO TABS
81.0000 mg | ORAL_TABLET | Freq: Every evening | ORAL | Status: DC
  Filled 2014-03-19: qty 1

## 2014-03-19 MED ORDER — CLOPIDOGREL BISULFATE 300 MG PO TABS
ORAL_TABLET | ORAL | Status: AC
  Filled 2014-03-19: qty 1

## 2014-03-19 MED ORDER — NITROGLYCERIN 1 MG/10 ML FOR IR/CATH LAB
INTRA_ARTERIAL | Status: AC
  Filled 2014-03-19: qty 10

## 2014-03-19 MED ORDER — CALCIUM ACETATE 667 MG PO CAPS
2001.0000 mg | ORAL_CAPSULE | Freq: Three times a day (TID) | ORAL | Status: DC
  Administered 2014-03-19 – 2014-03-20 (×2): 2001 mg via ORAL
  Filled 2014-03-19 (×6): qty 3

## 2014-03-19 MED ORDER — SODIUM CHLORIDE 0.9 % IV SOLN: 250.0000 mL | INTRAVENOUS | Status: DC | PRN

## 2014-03-19 MED ORDER — ASPIRIN 81 MG PO CHEW
81.0000 mg | CHEWABLE_TABLET | Freq: Every day | ORAL | Status: DC
  Filled 2014-03-19: qty 1

## 2014-03-19 MED ORDER — BIVALIRUDIN 250 MG IV SOLR
INTRAVENOUS | Status: AC
  Filled 2014-03-19: qty 250

## 2014-03-19 MED ORDER — SODIUM CHLORIDE 0.9 % IV SOLN
INTRAVENOUS | Status: DC
  Administered 2014-03-19: 07:00:00 via INTRAVENOUS

## 2014-03-19 MED ORDER — NITROGLYCERIN 0.4 MG SL SUBL: 0.4000 mg | SUBLINGUAL_TABLET | SUBLINGUAL | Status: DC | PRN

## 2014-03-19 MED ORDER — ISOSORBIDE MONONITRATE ER 30 MG PO TB24
30.0000 mg | ORAL_TABLET | Freq: Every day | ORAL | Status: DC
  Filled 2014-03-19 (×2): qty 1

## 2014-03-19 MED ORDER — HYDROMORPHONE HCL 2 MG PO TABS
2.0000 mg | ORAL_TABLET | Freq: Three times a day (TID) | ORAL | Status: DC | PRN
  Administered 2014-03-19: 17:00:00 2 mg via ORAL
  Filled 2014-03-19: qty 1

## 2014-03-19 MED ORDER — DIPHENHYDRAMINE HCL 50 MG/ML IJ SOLN
25.0000 mg | Freq: Once | INTRAMUSCULAR | Status: AC
  Administered 2014-03-19: 25 mg via INTRAVENOUS

## 2014-03-19 MED ORDER — FAMOTIDINE IN NACL 20-0.9 MG/50ML-% IV SOLN
INTRAVENOUS | Status: AC
  Filled 2014-03-19: qty 50

## 2014-03-19 MED ORDER — ATORVASTATIN CALCIUM 10 MG PO TABS
10.0000 mg | ORAL_TABLET | Freq: Every evening | ORAL | Status: DC
  Administered 2014-03-19: 10 mg via ORAL
  Filled 2014-03-19 (×2): qty 1

## 2014-03-19 MED ORDER — HEPARIN SODIUM (PORCINE) 1000 UNIT/ML IJ SOLN
INTRAMUSCULAR | Status: AC
  Filled 2014-03-19: qty 1

## 2014-03-19 MED ORDER — ACETAMINOPHEN 325 MG PO TABS: 650.0000 mg | ORAL_TABLET | ORAL | Status: DC | PRN

## 2014-03-19 MED ORDER — DIPHENHYDRAMINE HCL 50 MG/ML IJ SOLN
25.0000 mg | INTRAMUSCULAR | Status: AC
  Administered 2014-03-19: 25 mg via INTRAVENOUS

## 2014-03-19 MED ORDER — ASPIRIN 81 MG PO CHEW
CHEWABLE_TABLET | ORAL | Status: AC
Start: 2014-03-19 — End: 2014-03-19
  Administered 2014-03-19: 81 mg via ORAL
  Filled 2014-03-19: qty 1

## 2014-03-19 MED ORDER — CALCIUM ACETATE 667 MG PO CAPS
1334.0000 mg | ORAL_CAPSULE | ORAL | Status: DC | PRN
  Administered 2014-03-19: 22:00:00 1334 mg via ORAL
  Filled 2014-03-19 (×2): qty 2

## 2014-03-19 MED ORDER — FUROSEMIDE 80 MG PO TABS
160.0000 mg | ORAL_TABLET | Freq: Two times a day (BID) | ORAL | Status: DC
  Administered 2014-03-19 (×2): 160 mg via ORAL
  Filled 2014-03-19 (×4): qty 2

## 2014-03-19 MED ORDER — ROPINIROLE HCL 1 MG PO TABS
2.0000 mg | ORAL_TABLET | Freq: Every day | ORAL | Status: DC
  Administered 2014-03-19: 2 mg via ORAL
  Filled 2014-03-19 (×2): qty 2

## 2014-03-19 MED ORDER — LORATADINE 10 MG PO TABS
10.0000 mg | ORAL_TABLET | Freq: Every day | ORAL | Status: DC
  Administered 2014-03-19: 10 mg via ORAL
  Filled 2014-03-19 (×2): qty 1

## 2014-03-19 MED ORDER — ASPIRIN 81 MG PO CHEW
81.0000 mg | CHEWABLE_TABLET | ORAL | Status: AC
  Administered 2014-03-19: 81 mg via ORAL

## 2014-03-19 MED ORDER — SODIUM CHLORIDE 0.9 % IJ SOLN
3.0000 mL | Freq: Two times a day (BID) | INTRAMUSCULAR | Status: DC
  Administered 2014-03-19: 22:00:00 10 mL via INTRAVENOUS
  Administered 2014-03-19 – 2014-03-20 (×3): 3 mL via INTRAVENOUS

## 2014-03-19 MED ORDER — FAMOTIDINE 20 MG PO TABS
20.0000 mg | ORAL_TABLET | Freq: Every day | ORAL | Status: DC
  Administered 2014-03-19: 15:00:00 20 mg via ORAL
  Filled 2014-03-19 (×2): qty 1

## 2014-03-19 MED ORDER — DOCUSATE SODIUM 100 MG PO CAPS
200.0000 mg | ORAL_CAPSULE | Freq: Three times a day (TID) | ORAL | Status: DC
  Administered 2014-03-19 (×2): 200 mg via ORAL
  Filled 2014-03-19 (×5): qty 2

## 2014-03-19 MED ORDER — ONDANSETRON HCL 4 MG/2ML IJ SOLN: 4.0000 mg | Freq: Four times a day (QID) | INTRAMUSCULAR | Status: DC | PRN

## 2014-03-19 NOTE — CV Procedure (Addendum)
Cardiac Catheterization Operative Report  RAYBON DEDOMINICIS [id number] 11/25/20158:32 AM MATTINGLY,MICHAEL T, MD  Procedure Performed:  1. Left Heart Catheterization 2. Selective Coronary Angiography 3. PTCA/balloon angioplasty RCA 4. Angioseal right femoral artery  Operator: Lauree Chandler, MD  Indication:  48 yo male with history of DM, HTN, ESRD on HD, obesity, OSA with recent dyspnea concerning for angina. Stress myoview intermediate risk with possible inferior and apical ischemia. LV function is normal.  Access limited to left groin since he has had prior dialysis access in both arms and the right groin.                               Procedure Details: The risks, benefits, complications, treatment options, and expected outcomes were discussed with the patient. The patient and/or family concurred with the proposed plan, giving informed consent. The patient was brought to the cath lab after IV hydration was begun and oral premedication was given. The patient was further sedated with Versed and Fentanyl. The left groin was prepped and draped in the usual manner. Using the modified Seldinger access technique, a 5 French sheath was placed in the left femoral artery. Standard diagnostic catheters were used to perform selective coronary angiography. A pigtail catheter was used to cross the aortic valve. LV pressures were measured. He was found to have a calcified sub-total occlusion of the mid RCA with TIMI-1 flow down the vessel (of note, the distal RCA was seen to fill from left to right collaterals). I elected to attempt PCI of the RCA.   PCI Note: He was given Plavix 600 mg po x 1. He was given a weight based bolus of angiomax and a drip was started. I then engaged the RCA with a JR4 guiding catheter. A Cougar IC wire was advanced easily across the mid stenosis and into the distal vessel. I then pre-dilated the lesion with a 1.5 x 15 mm balloon x 1. I then pre-dilated with a 2.0 x 12 mm  balloon x 2. I then pre-dilated with a 2.5 x 15 mm balloon. I was unable to deliver a stent due to calcification of the vessel. I then placed a Guideliner guide extension catheter into the mid vessel over the wire. A 2.75 x 12 mm [place] balloon was used to pre-dilate. This balloon expanded well. I was still unable to deliver a stent into the mid vessel despite the fact that the Guideliner was down at the lesion. There was a larger channel present with TIMI-3 flow down the vessel. I elected to stop and bring him back for planned rotablator atherectomy in 2 days.   There were no immediate complications. The patient was taken to the recovery area in stable condition.   Hemodynamic Findings: Central aortic pressure: 188/94 Left ventricular pressure: 188/19/26  Angiographic Findings:  Left main: No obstructive disease.   Left Anterior Descending Artery: Large caliber vessel that courses to the apex. There is diffuse mild plaque in the proximal and mid vessel. There is a moderate caliber diagonal branch with mid to distal 80% stenosis.   Circumflex Artery: Large vessel with large obtuse marginal branch. Mild diffuse plaque.   Right Coronary Artery: Large caliber dominant vessel with 99% sub-total occlusion (functional CTO) with TIMI-1 flow noted beyond the stenosis. The distal vessel is also noted to have a 80% stenosis. The distal vessel fills from left to right collaterals.   Left Ventricular Angiogram: Deferred.  Impression: 1. Severe stenosis mid RCA, s/p balloon angioplasty only of the mid RCA. (unable to deliver stent) 2. Severe stenosis mid to distal diagonal-medical management of this vessel 3. Patent Circumflex and LAD  Recommendations: I was unable to deliver a stent into the mid RCA despite aggressive guide support with a Guideliner secondary to diffuse, severe calcification of the vessel. Will place on telemetry unit tonight and monitor closely. He will need rotablator atherectomy of the  mid RCA. This will be staged in 2-3 weeks. I will plan on discharge home in the am unless he has clinical instability. I will contact him to arrange a date and time for cath/PCI. Will continue ASA, Plavix, statin, beta blocker.        Complications:  None. The patient tolerated the procedure well.

## 2014-03-19 NOTE — Progress Notes (Signed)
Pt arrived from cath lab. Pt was prepped and draped, but procedure was not started.  Procedure is delayed due to [facility] activation.  Pt is alert, oriented and in no distress.  Vitals are monitored , family called for to holding area.

## 2014-03-19 NOTE — Progress Notes (Signed)
Pt c/o severe itching to rt arm and back and bil flank area.  States this happened last time he got iv dye. Pt was premedicated for dye allergy.  Pt denies SOB or throat swelling, lungs CTA.  Dr. Raelyn Mora notified.  IV benadryl given w/ good result, mild itching remains but not near as severe.  Pt now c/o severe back pain, rates 8/10, takes po dilaudid at home, given IV dilaudid per pt request.  Reminded pt not to get up w/o assist, pt voices understanding, call light in reach.  CBG 281, wearing insulin pump, pt contract signed and on chart, bolused self with 4.5 units.  Left groin level 1, small bruised, soft, no hematoma, drsg D&I.

## 2014-03-19 NOTE — Progress Notes (Signed)
ANTICOAGULATION CONSULT NOTE - Initial Consult  Pharmacy Consult for Heparin Indication: chest pain/ACS  Allergies  Allergen Reactions  . Doxycycline     RASH  . Hydrocodone     RASH  . Morphine And Related Hives and Nausea Only  . Amoxicillin Other (See Comments)    Reaction unknown  . Ciprofloxacin Other (See Comments)    Reaction unknown  . Clindamycin/Lincomycin Other (See Comments)    Reaction unknown  . Codeine Other (See Comments)    Reaction unknown. But pt has tolerated hydrocodone in Vicodin and Norco in the past  . Levofloxacin Other (See Comments)    Reaction unknown  . Vasotec Cough  . Contrast Media [Iodinated Diagnostic Agents] Rash  . Oxycodone Rash    Patient Measurements: Height: 5\' 8"  (172.7 cm) Weight: 275 lb (124.739 kg) IBW/kg (Calculated) : 68.4 Heparin Dosing Weight: 97.5 kg  Vital Signs: Temp: 97.6 F (36.4 C) (11/25 1259) Temp Source: Oral (11/25 1259) BP: 169/74 mmHg (11/25 1259) Pulse Rate: 100 (11/25 1259)  Labs: No results for input(s): HGB, HCT, PLT, APTT, LABPROT, INR, HEPARINUNFRC, CREATININE, CKTOTAL, CKMB, TROPONINI in the last 72 hours.  Estimated Creatinine Clearance: 19.4 mL/min (by C-G formula based on Cr of 6).   Medical History: Past Medical History  Diagnosis Date  . Insulin dependent diabetes mellitus   . Hypertension   . History of blood transfusion   . ESRD (end stage renal disease)     a. 1995 s/p cadaveric transplant w/ susbequent failure after 18 yrs;  b.Dialysis initiated 07/2011  . Dyspnea     a. 07/23/11 Echo: EF 55-60%  . Anemia of chronic disease   . Cholecystitis     a. 08/27/2011  . Constipation   . Pericardial effusion     a.  Small by CT 08/22/11;  b.  Large by CT 08/27/11  . Inguinal lymphadenopathy     a. bilateral - s/p biopsy 07/2011  . Fibromyalgia   . Hx of echocardiogram     Echo (11/15):  Mild LVH, EF 60-65%, no RWMA, Gr 1 DD, MAC, mild LAE, normal RVF, mild RAE  . Hx of cardiovascular stress  test     Lexiscan Myoview (11/15):  Inf, apical cap and apical lateral ischemia, EF 56%, inf HK, Moderate Risk    Assessment: 86 YOM with history of DM, HTN, ESRD on HD, s/p cath today, with severe RCA stenosis, will need repeat cath and rotablator atherectomy of the mid RCA on 11/27. Pharmacy is consulted to start heparin 8 hr after sheath removal (1210). Not on anticoagulation PTA. Baselien hgb 10.9, plt 230 on 11/12.  Goal of Therapy:  Heparin level 0.3-0.7 units/ml Monitor platelets by anticoagulation protocol: Yes   Plan:  - Start heparin with no bolus 1200 units/hr at 2000 - Daily heparin level and CBC   Maryanna Shape, PharmD, BCPS  Clinical Pharmacist  Pager: [phone]   03/19/2014,2:04 PM

## 2014-03-19 NOTE — Progress Notes (Signed)
Pt is taken to procedure room. Pt is alert and oriented. Pt family is updated and sent to waiting area.

## 2014-03-19 NOTE — Interval H&P Note (Signed)
History and Physical Interval Note:  03/19/2014 8:20 AM  Dale [name]  has presented today for cardiac cath with the diagnosis of chest pain, abnormal stress test. The various methods of treatment have been discussed with the patient and family. After consideration of risks, benefits and other options for treatment, the patient has consented to  Procedure(s): LEFT HEART CATHETERIZATION WITH CORONARY ANGIOGRAM (N/A) as a surgical intervention .  [name] history has been reviewed, patient examined, no change in status, stable for surgery.  I have reviewed the patient's chart and labs.  Questions were answered to the patient's satisfaction.    Cath Lab Visit (complete for each Cath Lab visit)  Clinical Evaluation Leading to the Procedure:   ACS: No.  Non-ACS:    Anginal Classification: CCS II  Anti-ischemic medical therapy: Minimal Therapy (1 class of medications)  Non-Invasive Test Results: Intermediate-risk stress test findings: cardiac mortality 1-3%/year  Prior CABG: No previous CABG         Teodoro Jeffreys

## 2014-03-20 ENCOUNTER — Encounter (HOSPITAL_COMMUNITY): Payer: Self-pay | Admitting: Physician Assistant

## 2014-03-20 DIAGNOSIS — I25110 Atherosclerotic heart disease of native coronary artery with unstable angina pectoris: Principal | ICD-10-CM

## 2014-03-20 DIAGNOSIS — T50995A Adverse effect of other drugs, medicaments and biological substances, initial encounter: Secondary | ICD-10-CM

## 2014-03-20 DIAGNOSIS — N186 End stage renal disease: Secondary | ICD-10-CM

## 2014-03-20 DIAGNOSIS — I200 Unstable angina: Secondary | ICD-10-CM

## 2014-03-20 LAB — CBC
HCT: 35.1 % — ABNORMAL LOW (ref 39.0–52.0)
Hemoglobin: 11 g/dL — ABNORMAL LOW (ref 13.0–17.0)
MCH: 31.2 pg (ref 26.0–34.0)
MCHC: 31.3 g/dL (ref 30.0–36.0)
MCV: 99.4 fL (ref 78.0–100.0)
Platelets: 270 10*3/uL (ref 150–400)
RBC: 3.53 MIL/uL — ABNORMAL LOW (ref 4.22–5.81)
RDW: 15.7 % — ABNORMAL HIGH (ref 11.5–15.5)
WBC: 10.8 10*3/uL — ABNORMAL HIGH (ref 4.0–10.5)

## 2014-03-20 LAB — BASIC METABOLIC PANEL
Anion gap: 17 — ABNORMAL HIGH (ref 5–15)
BUN: 56 mg/dL — ABNORMAL HIGH (ref 6–23)
CO2: 22 mEq/L (ref 19–32)
Calcium: 9.9 mg/dL (ref 8.4–10.5)
Chloride: 98 mEq/L (ref 96–112)
Creatinine, Ser: 6.45 mg/dL — ABNORMAL HIGH (ref 0.50–1.35)
GFR calc Af Amer: 11 mL/min — ABNORMAL LOW (ref >90)
GFR calc non Af Amer: 9 mL/min — ABNORMAL LOW (ref >90)
Glucose, Bld: 106 mg/dL — ABNORMAL HIGH (ref 70–99)
Potassium: 4.3 mEq/L (ref 3.7–5.3)
Sodium: 137 mEq/L (ref 137–147)

## 2014-03-20 LAB — GLUCOSE, CAPILLARY
Glucose-Capillary: 123 mg/dL — ABNORMAL HIGH (ref 70–99)
Glucose-Capillary: 128 mg/dL — ABNORMAL HIGH (ref 70–99)

## 2014-03-20 LAB — HEPARIN LEVEL (UNFRACTIONATED): Heparin Unfractionated: 0.15 IU/mL — ABNORMAL LOW (ref 0.30–0.70)

## 2014-03-20 MED ORDER — METHYLPREDNISOLONE 4 MG PO KIT: 8.0000 mg | PACK | Freq: Every evening | ORAL | Status: DC

## 2014-03-20 MED ORDER — METOPROLOL SUCCINATE 12.5 MG HALF TABLET
12.5000 mg | ORAL_TABLET | Freq: Every day | ORAL | Status: DC
  Filled 2014-03-20: qty 1

## 2014-03-20 MED ORDER — DIPHENHYDRAMINE HCL 25 MG PO CAPS: 25.0000 mg | ORAL_CAPSULE | Freq: Four times a day (QID) | ORAL | Status: DC | PRN

## 2014-03-20 MED ORDER — CLOPIDOGREL BISULFATE 75 MG PO TABS: 75.0000 mg | ORAL_TABLET | Freq: Every day | ORAL | Status: DC

## 2014-03-20 MED ORDER — METHYLPREDNISOLONE 4 MG PO KIT
4.0000 mg | PACK | Freq: Four times a day (QID) | ORAL | Status: DC
Start: 2014-03-22 — End: 2014-03-20

## 2014-03-20 MED ORDER — CAMPHOR-MENTHOL 0.5-0.5 % EX LOTN
TOPICAL_LOTION | CUTANEOUS | Status: DC | PRN
  Administered 2014-03-20: 06:00:00 via TOPICAL
  Filled 2014-03-20: qty 222

## 2014-03-20 MED ORDER — METHYLPREDNISOLONE 4 MG PO KIT
8.0000 mg | PACK | Freq: Every morning | ORAL | Status: DC
  Filled 2014-03-20: qty 21

## 2014-03-20 MED ORDER — METHYLPREDNISOLONE 4 MG PO KIT: PACK | ORAL | Status: DC

## 2014-03-20 MED ORDER — METHYLPREDNISOLONE 4 MG PO KIT: 4.0000 mg | PACK | Freq: Three times a day (TID) | ORAL | Status: DC

## 2014-03-20 MED ORDER — METOPROLOL SUCCINATE ER 25 MG PO TB24
12.5000 mg | ORAL_TABLET | Freq: Every day | ORAL | Status: DC
Start: 2014-03-20 — End: 2014-11-18

## 2014-03-20 MED ORDER — METHYLPREDNISOLONE 4 MG PO KIT: 4.0000 mg | PACK | ORAL | Status: DC

## 2014-03-20 MED ORDER — HYDROXYZINE HCL 25 MG PO TABS
25.0000 mg | ORAL_TABLET | Freq: Once | ORAL | Status: AC
  Administered 2014-03-20: 01:00:00 25 mg via ORAL
  Filled 2014-03-20: qty 1

## 2014-03-20 MED ORDER — METHYLPREDNISOLONE SODIUM SUCC 125 MG IJ SOLR
125.0000 mg | INTRAMUSCULAR | Status: AC
  Administered 2014-03-20: 08:00:00 125 mg via INTRAVENOUS
  Filled 2014-03-20 (×2): qty 2

## 2014-03-20 MED ORDER — HYDROXYZINE HCL 25 MG PO TABS
25.0000 mg | ORAL_TABLET | ORAL | Status: AC
  Administered 2014-03-20: 07:00:00 25 mg via ORAL
  Filled 2014-03-20 (×2): qty 1

## 2014-03-20 NOTE — Progress Notes (Signed)
Pt feeling miserable with constant itching.  Denies SOB.  Upper body pink, some scattered small bumps.  Dr Posey Pronto informed, RN requested Solumedrol, MD advised "wait until morning".  Order received for hydroxyzine and sarna lotion per RN request, med ordered from pharmacy.

## 2014-03-20 NOTE — Progress Notes (Signed)
Pt again c/o intense itching, scratching his arms and chest.  Back and chest and upper arms pink.  Lungs CTA, denies SOB.  Dr. Posey Pronto informed, orders received, PO hydroxyzine given.  Advised pt to call RN if symptoms worsen.

## 2014-03-20 NOTE — Progress Notes (Signed)
Sarna lotion applied to pink areas, pt voices some relief.  Still awaiting hydroxyzine from pharmacy, Called pharmacy to advise med has not yet arrived to floor.  Pt getting restless and irritable about ongoing itching.  Richardson Dopp PA on unit, advised of pt condition.

## 2014-03-20 NOTE — Progress Notes (Signed)
Primary cardiologist:  Dr. Lauree Chandler   Subjective:    48 yo M with hx of ESRD (failed renal transplant), DM, HTN, OSA, s/p L BKA.  He was recently seen for DOE and Myoview demonstrated inferior ischemia.  LHC demonstrated severe mRCA stenosis, severe mid to dist Dx stenosis.  PCI was attempted on the RCA that was treated with POBA.  Stent could not be delivered due to severe calcification.  He will need to return in 2-3 weeks for staged PCI with rotablator atherectomy of the RCA.  Dx to be treated medically.  Patient with IV dye allergy.  He was pretreated.  But, he is having severe itching this AM.    This AM, he denies chest pain.  His breathing is better.   Objective:   Temp:  [97.3 F (36.3 C)-98.3 F (36.8 C)] 97.3 F (36.3 C) (11/26 0601) Pulse Rate:  [84-107] 86 (11/26 0601) Resp:  [15-25] 18 (11/26 0601) BP: (137-182)/(45-87) 167/70 mmHg (11/26 0601) SpO2:  [95 %-100 %] 100 % (11/26 0601) Weight:  [276 lb 0.3 oz (125.2 kg)] 276 lb 0.3 oz (125.2 kg) (11/26 0023) Last BM Date: 03/19/14  Filed Weights   03/19/14 0629 03/20/14 0023  Weight: 275 lb (124.739 kg) 276 lb 0.3 oz (125.2 kg)    Intake/Output Summary (Last 24 hours) at 03/20/14 0715 Last data filed at 03/20/14 0600  Gross per 24 hour  Intake    800 ml  Output      0 ml  Net    800 ml    Telemetry:  NSR, sinus tachy  Exam:  General:  NAD  HEENT:  I cannot appreciate JVD  Lungs:  CTA bilat  Cardiac:  RRR  Abdomen:  Soft   Extremities:  L groin without hematoma or bruit  Lab Results:  Basic Metabolic Panel:  Recent Labs Lab 03/20/14 0323  NA 137  K 4.3  CL 98  CO2 22  GLUCOSE 106*  BUN 56*  CREATININE 6.45*  CALCIUM 9.9    Liver Function Tests: No results for input(s): AST, ALT, ALKPHOS, BILITOT, PROT, ALBUMIN in the last 168 hours.  CBC:  Recent Labs Lab 03/20/14 0323  WBC 10.8*  HGB 11.0*  HCT 35.1*  MCV 99.4  PLT 270    Cardiac Enzymes: No results for  input(s): CKTOTAL, CKMB, CKMBINDEX, TROPONINI in the last 168 hours.  BNP: No results for input(s): PROBNP in the last 8760 hours.  Coagulation: No results for input(s): INR in the last 168 hours.  ECG:  NSR, HR 86, no ST changes   Medications:   Scheduled Medications: . allopurinol  200 mg Oral Daily  . aspirin  81 mg Oral Daily  . atorvastatin  10 mg Oral QPM  . calcium acetate  2,001 mg Oral TID WC  . clopidogrel  75 mg Oral Q breakfast  . docusate sodium  200 mg Oral TID  . famotidine  20 mg Oral Daily  . furosemide  160 mg Oral BID  . hydrOXYzine  25 mg Oral Once  . hydrOXYzine  25 mg Oral NOW  . insulin pump   Subcutaneous TID AC & HS  . isosorbide mononitrate  30 mg Oral Daily  . levothyroxine  100 mcg Oral QAC breakfast  . loratadine  10 mg Oral Daily  . metoCLOPramide  5 mg Oral TID PC & HS  . rOPINIRole  2 mg Oral Q supper  . sodium chloride  3 mL Intravenous Q12H  Infusions: . heparin 1,500 Units/hr (03/20/14 0518)     PRN Medications:  acetaminophen, calcium acetate, camphor-menthol, HYDROmorphone (DILAUDID) injection, HYDROmorphone, nitroGLYCERIN, ondansetron (ZOFRAN) IV   Assessment:    Principal Problem:   Unstable angina Active Problems:   CAD (coronary artery disease)   Diabetes mellitus   ESRD (end stage renal disease)   HTN (hypertension)   S/P BKA (below knee amputation)   Allergic reaction to dye     Plan/Discussion:    Patient is having an allergic reaction to the IV dye despite pretreatment.  He has received Atarax with some improvement.  Will give IV Solu-Medrol 125 mg now.    Probable DC later this AM after seen by MD.  DC home on ASA, Plavix, nitrate, statin.  Add low dose beta blocker - Toprol-XL 12.5 mg QD.  He can hold on dialysis days.  Plan staged PCI in 2-3 weeks with HSRA of the RCA.  Keep FU 12/10 with me.  Danton Sewer, PA-C   03/20/2014 7:15 AM Pager [phone]     Patient seen and examined and  history reviewed. Agree with above findings and plan. Patient is experiencing an allergic reaction to contrast despite pre treatment with steroids, Benadryl and Zantac. Itchy diffuse red rash. No SOB or chest pain. Lungs clear. No gallop or murmur. Labs stable. Ecg without acute change. Will plan to DC today with Medrol dose pack. Continue Benadryl and Zantac. On ASA and Plavix. Plan to return in 2-3 weeks for rotational atherectomy of RCA with stenting. OK for DC today with follow up in office.   Mohammad Granade [place], [place] 03/20/2014 7:59 AM

## 2014-03-20 NOTE — Discharge Instructions (Signed)
NO HEAVY LIFTING OR SEXUAL ACTIVITY X 7 DAYS. NO DRIVING X 3 DAYS. NO SOAKING BATHS, HOT TUBS, POOLS, ETC., X 7 DAYS  Low Sodium, Heart-Healthy Diet, Diabetic diet  Call [phone] for problems with the cath site.

## 2014-03-20 NOTE — Discharge Summary (Signed)
Discharge Summary   Patient ID: Dale [name], MRN: [id number], DOB/AGE: May 31, 1965 48 y.o.  Admit date: 03/19/2014 Discharge date: 03/20/2014   Primary Care Physician:  MATTINGLY,MICHAEL T   Primary Cardiologist:  Dr. Lauree Chandler   Reason for Admission:  Unstable Angina   Primary Discharge Diagnoses:  Principal Problem:   Unstable angina Active Problems:   CAD (coronary artery disease)   Diabetes mellitus   ESRD (end stage renal disease)   HTN (hypertension)   S/P BKA (below knee amputation)   Allergic reaction to dye     Wt Readings from Last 3 Encounters:  03/20/14 276 lb 0.3 oz (125.2 kg)  03/06/14 285 lb (129.275 kg)  02/25/14 285 lb (129.275 kg)    Secondary Discharge Diagnoses:   Past Medical History  Diagnosis Date  . Hypertension   . History of blood transfusion     "related to dialysis"  . Dyspnea     a. 07/23/11 Echo: EF 55-60%  . Anemia of chronic disease   . Cholecystitis     a. 08/27/2011  . Constipation   . Pericardial effusion     a.  Small by CT 08/22/11;  b.  Large by CT 08/27/11  . Inguinal lymphadenopathy     a. bilateral - s/p biopsy 07/2011  . Fibromyalgia   . Hx of echocardiogram     Echo (11/15):  Mild LVH, EF 60-65%, no RWMA, Gr 1 DD, MAC, mild LAE, normal RVF, mild RAE  . Hx of cardiovascular stress test     Lexiscan Myoview (11/15):  Inf, apical cap and apical lateral ischemia, EF 56%, inf HK, Moderate Risk  . High cholesterol   . Pneumonia ~ 2007  . OSA (obstructive sleep apnea)     "I'm not doing the mask; claustrophobic"  . Claustrophobia   . Hypothyroidism   . Insulin dependent diabetes mellitus   . GERD (gastroesophageal reflux disease)   . Arthritis     "hands, right knee" (03/19/2014)  . ESRD (end stage renal disease)     a. 1995 s/p cadaveric transplant w/ susbequent failure after 18 yrs;  b.Dialysis initiated 07/2011  . CAD (coronary artery disease)     a. LHC (11/15):  mid to dist Dx 80%, mid RCA 99%  (functional CTO), dist RCA 80% >> PCI: balloon angioplasty to mid RCA (could not deliver stent) - plan staged PCI with HSRA of RCA; Dx to be tx medically       Allergies:    Allergies  Allergen Reactions  . Doxycycline     RASH  . Hydrocodone     RASH  . Morphine And Related Hives and Nausea Only  . Amoxicillin Other (See Comments)    Reaction unknown  . Ciprofloxacin Other (See Comments)    Reaction unknown  . Clindamycin/Lincomycin Other (See Comments)    Reaction unknown  . Codeine Other (See Comments)    Reaction unknown. But pt has tolerated hydrocodone in Vicodin and Norco in the past  . Levofloxacin Other (See Comments)    Reaction unknown  . Vasotec Cough  . Contrast Media [Iodinated Diagnostic Agents] Rash  . Oxycodone Rash      Procedures Performed This Admission:    CARDIAC CATHETERIZATION AND PERCUTANEOUS CORONARY INTERVENTION (03/19/14): Hemodynamic Findings: Central aortic pressure: 188/94 Left ventricular pressure: 188/19/26  Angiographic Findings:  Left main: No obstructive disease.    Left Anterior Descending Artery: Large caliber vessel that courses to the apex. There is diffuse mild plaque  in the proximal and mid vessel. There is a moderate caliber diagonal branch with mid to distal 80% stenosis.    Circumflex Artery: Large vessel with large obtuse marginal branch. Mild diffuse plaque.    Right Coronary Artery: Large caliber dominant vessel with 99% sub-total occlusion (functional CTO) with TIMI-1 flow noted beyond the stenosis. The distal vessel is also noted to have a 80% stenosis. The distal vessel fills from left to right collaterals.   Left Ventricular Angiogram: Deferred.   Impression: 1. Severe stenosis mid RCA, s/p balloon angioplasty only of the mid RCA. (unable to deliver stent) 2. Severe stenosis mid to distal diagonal-medical management of this vessel 3. Patent Circumflex and LAD  Recommendations: I was unable to deliver a stent into  the mid RCA despite aggressive guide support with a Guideliner secondary to diffuse, severe calcification of the vessel. Will place on telemetry unit tonight and monitor closely. He will need rotablator atherectomy of the mid RCA. This will be staged in 2-3 weeks. I will plan on discharge home in the am unless he has clinical instability. I will contact him to arrange a date and time for cath/PCI. Will continue ASA, Plavix, statin, beta blocker.          Complications:  None. The patient tolerated the procedure well.               Hospital Course:  EMPEROR HIRTE is a 48 y.o. male with a history of diabetes, HTN, sleep apnea, ESRD, fibromyalgia, chronic anemia, status post left BKA. He underwent renal transplant and had been off of dialysis. He was seen by Dr. Lauree Chandler in 05/2012 in the hospital for chest pain. Diagnostic options were limited as cardiac catheterization would have been detrimental to his transplanted kidney. Therefore, stress testing was not thought to be helpful. Medical therapy was recommended. Further ischemic evaluation could be pursued if he had recurrent symptoms.  He was then admitted 06/2012 with severe septic shock secondary to Salmonella bacteremia. He developed worsening renal function and hemodialysis was resumed.   He was recently seen in the office for worsening DOE despite a lowered dialysis weight. Echo demonstrated normal LVF.  Myoview demonstrated inferior, apical and apical lateral ischemia.  He was therefore brought in for cardiac catheterization yesterday. This demonstrated severe stenosis in the mid RCA that was treated with balloon angioplasty.  Stent could not be delivered due to heavy calcification.  Therefore, it was decided to bring the patient back in 2-3 weeks for rotablator atherectomy of the RCA.  There was also severe stenosis in the mid to distal diagonal that will be treated medically.  He was seen this AM by Dr. Peter Martinique.  Patient was noted to  have increasing amounts of itching post cath despite IV dye allergy pre-treatment.  He was given Atarax prn and an dose of IV solu-medrol this AM.  Patient denied chest pain and he felt that his dyspnea was improved.  He is felt stable for DC to home.  Will send home on prednisone dose pack x 6 days.  Will plan on staged PCI in 2-3 weeks as outlined above.  He can keep FU as planned for now.    Discharge Vitals:   Blood pressure 167/70, pulse 86, temperature 97.3 F (36.3 C), temperature source Oral, resp. rate 18, height 5\' 8"  (1.727 m), weight 276 lb 0.3 oz (125.2 kg), SpO2 100 %.   Labs:   Recent Labs  03/20/14 0323  WBC 10.8*  HGB 11.0*  HCT 35.1*  MCV 99.4  PLT 270     Recent Labs  03/20/14 0323  NA 137  K 4.3  CL 98  CO2 22  BUN 56*  CREATININE 6.45*  CALCIUM 9.9    Diagnostic Procedures and Studies:  No results found.   Disposition:   Pt is being discharged home today in good condition.  Follow-up Plans & Appointments      Follow-up Information    Follow up with [facility], [place].   Specialty:  Cardiology   Why:  office will call to arrange your next procedure   Contact information:   [address] [place] [address] [phone]       Follow up with Richardson Dopp, PA-C On 04/03/2014.   Specialty:  Physician Assistant   Why:  keep appt at 3:40 pm   Contact information:   [address] [place] [address] [phone]       Discharge Medications    Medication List    STOP taking these medications        predniSONE 20 MG tablet  Commonly known as:  DELTASONE      TAKE these medications        allopurinol 100 MG tablet  Commonly known as:  ZYLOPRIM  Take 200 mg by mouth daily.     aspirin 81 MG tablet  Take 81 mg by mouth every evening.     atorvastatin 10 MG tablet  Commonly known as:  LIPITOR  Take 10 mg by mouth every evening.     calcium acetate 667 MG capsule  Commonly known  as:  PHOSLO  Take 1,334-2,001 mg by mouth See admin instructions. Take 2,001 mg (3 capsules) with meals and 1,334 mg (2 capsules) with snacks     clopidogrel 75 MG tablet  Commonly known as:  PLAVIX  Take 1 tablet (75 mg total) by mouth daily with breakfast.     diphenhydrAMINE 25 mg capsule  Commonly known as:  BENADRYL  Take 1 capsule (25 mg total) by mouth every 6 (six) hours as needed for itching.     docusate sodium 100 MG capsule  Commonly known as:  COLACE  Take 200 mg by mouth 3 (three) times daily.     furosemide 80 MG tablet  Commonly known as:  LASIX  Take 160 mg by mouth 2 (two) times daily.     GLUCAGON EMERGENCY 1 MG injection  Generic drug:  glucagon  Inject 1 mg into the muscle daily as needed (low blood sugar).     HYDROmorphone 2 MG tablet  Commonly known as:  DILAUDID  Take 2 mg by mouth 3 (three) times daily as needed for severe pain.     insulin pump Soln  Inject into the skin continuous. Humalog     isosorbide mononitrate 30 MG 24 hr tablet  Commonly known as:  IMDUR  Take 1 tablet (30 mg total) by mouth daily.     levothyroxine 100 MCG tablet  Commonly known as:  SYNTHROID, LEVOTHROID  Take 100 mcg by mouth daily before breakfast.     loratadine 10 MG tablet  Commonly known as:  CLARITIN  Take 10 mg by mouth daily.     methylPREDNISolone 4 MG tablet  Commonly known as:  MEDROL DOSEPAK  Take as directed     metoCLOPramide 5 MG tablet  Commonly known as:  REGLAN  Take 5 mg by mouth 4 (four)  times daily - after meals and at bedtime.     metoprolol succinate 25 MG 24 hr tablet  Commonly known as:  TOPROL-XL  Take 0.5 tablets (12.5 mg total) by mouth daily.     nitroGLYCERIN 0.4 MG SL tablet  Commonly known as:  NITROSTAT  Place 0.4 mg under the tongue every 5 (five) minutes as needed for chest pain.     promethazine 25 MG tablet  Commonly known as:  PHENERGAN  Take 25 mg by mouth Every 6 hours as needed for nausea or vomiting. For nausea       ranitidine 150 MG tablet  Commonly known as:  ZANTAC  Take 150 mg by mouth 2 (two) times daily.     rOPINIRole 2 MG tablet  Commonly known as:  REQUIP  Take 2 mg by mouth daily with supper.         Outstanding Labs/Studies  1. none   Duration of Discharge Encounter: Greater than 30 minutes including physician and PA time.  Signed, Richardson Dopp, PA-C   03/20/2014 8:07 AM

## 2014-03-20 NOTE — Progress Notes (Signed)
ANTICOAGULATION CONSULT NOTE - Follow Up Consult  Pharmacy Consult for heparin Indication: CAD awaiting PCI    Labs:  Recent Labs  03/20/14 0323  HGB 11.0*  HCT 35.1*  PLT 270  HEPARINUNFRC 0.15*  CREATININE 6.45*    Assessment: 48yo male subtherapeutic on heparin with initial dosing post-cath awaiting staged PCI, possible discharge this am.  Goal of Therapy:  Heparin level 0.3-0.7 units/ml   Plan:  Will increase heparin gtt by 2-3 units/kg/r to 1500 units/hr and check level in [place], PharmD, BCPS  03/20/2014,4:57 AM

## 2014-03-21 ENCOUNTER — Ambulatory Visit (HOSPITAL_COMMUNITY): Admission: RE | Admit: 2014-03-21 | Payer: Medicare Other | Source: Ambulatory Visit | Admitting: Cardiology

## 2014-03-21 ENCOUNTER — Encounter (HOSPITAL_COMMUNITY): Admission: RE | Payer: Self-pay | Source: Ambulatory Visit

## 2014-03-21 SURGERY — PERCUTANEOUS CORONARY ROTOBLATOR INTERVENTION (PCI-R)
Anesthesia: LOCAL

## 2014-03-21 MED FILL — Sodium Chloride IV Soln 0.9%: INTRAVENOUS | Qty: 50 | Status: AC

## 2014-03-26 ENCOUNTER — Encounter: Payer: Self-pay | Admitting: Physician Assistant

## 2014-03-27 ENCOUNTER — Telehealth: Payer: Self-pay | Admitting: Cardiovascular Disease

## 2014-03-27 NOTE — Telephone Encounter (Signed)
Cath planned for 04/09/14 at 8:30am. Discussed with pt. Pre-cath orders will have to be placed after office visit. I do not have a recent outpatient encounter to addend to place the hospital orders. cdm

## 2014-04-03 ENCOUNTER — Encounter (HOSPITAL_COMMUNITY): Payer: Self-pay | Admitting: Vascular Surgery

## 2014-04-03 ENCOUNTER — Ambulatory Visit (INDEPENDENT_AMBULATORY_CARE_PROVIDER_SITE_OTHER): Payer: Medicare Other | Admitting: Physician Assistant

## 2014-04-03 VITALS — BP 150/80 | HR 76 | Ht 68.0 in | Wt 281.0 lb

## 2014-04-03 DIAGNOSIS — N186 End stage renal disease: Secondary | ICD-10-CM

## 2014-04-03 DIAGNOSIS — I10 Essential (primary) hypertension: Secondary | ICD-10-CM

## 2014-04-03 DIAGNOSIS — T50995D Adverse effect of other drugs, medicaments and biological substances, subsequent encounter: Secondary | ICD-10-CM

## 2014-04-03 DIAGNOSIS — I5032 Chronic diastolic (congestive) heart failure: Secondary | ICD-10-CM

## 2014-04-03 DIAGNOSIS — R0600 Dyspnea, unspecified: Secondary | ICD-10-CM

## 2014-04-03 DIAGNOSIS — E785 Hyperlipidemia, unspecified: Secondary | ICD-10-CM

## 2014-04-03 DIAGNOSIS — I25110 Atherosclerotic heart disease of native coronary artery with unstable angina pectoris: Secondary | ICD-10-CM

## 2014-04-03 MED ORDER — PRASUGREL HCL 10 MG PO TABS: 10.0000 mg | ORAL_TABLET | Freq: Every day | ORAL | Status: DC

## 2014-04-03 MED ORDER — PREDNISONE 20 MG PO TABS: 40.0000 mg | ORAL_TABLET | Freq: Every day | ORAL | Status: DC

## 2014-04-03 NOTE — H&P (Signed)
History and Physical   Date:  04/03/2014   ID:  Dale [name], DOB 13-Oct-1965, MRN [id number]  PCP:  Dale Kalata, MD  Cardiologist:  Dr. Lauree Chandler     History of Present Illness: Dale [name] is a 48 y.o. male with a history of diabetes, HTN, sleep apnea, ESRD, fibromyalgia, chronic anemia, status post left BKA. He underwent renal transplant and had been off of dialysis.  He was seen by Dr. Lauree Chandler in 05/2012 in the hospital for chest pain. Diagnostic options were limited as cardiac catheterization would have been detrimental to his transplanted kidney. Therefore, stress testing was not thought to be helpful. Medical therapy was recommended. Further ischemic evaluation could be pursued if he had recurrent symptoms.   Admitted 06/2012 with severe septic shock secondary to Salmonella bacteremia.  He developed worsening renal function and hemodialysis was resumed.  I saw him 10/27 for further evaluation of dyspnea with exertion. He denied chest discomfort. He had no improvement with reduced dry weight.  Echo was obtained and this demonstrated normal LVF and mild diastolic dysfunction.  A myoview was obtained and this was abnormal with inferior, apical and apical lateral ischemia (mod risk).  LHC was arranged and this demonstrated severe RCA stenosis that was treated with POBA.  It was felt he should be brought back in 2-3 weeks for rotablator atherectomy of the RCA.  Severe disease in the Dx would be treated medically.  Patient had significant IV dye allergic reaction post PCI.  He was DC on Prednisone dose pack.  He returns for FU.  He is still having a pruritic rash.  It is on his arms and abdomen.  It was no better with the prednisone.  He denies chest pain.  His breathing is about the same.  He had some increased dyspnea this past weekend.  He usually feels worse on the weekends.  He denies syncope.  No fever, cough, melena, hematochezia.      Recent  Labs/Images:  Recent Labs  06/17/13 0625  03/20/14 0323  NA 132*  < > 137  K 4.4  < > 4.3  BUN 55*  < > 56*  CREATININE 7.44*  < > 6.45*  ALT 15  --   --   HGB 10.1*  < > 11.0*  < > = values in this interval not displayed.     Wt Readings from Last 3 Encounters:  04/03/14 281 lb (127.461 kg)  03/20/14 276 lb 0.3 oz (125.2 kg)  03/06/14 285 lb (129.275 kg)     Past Medical History  Diagnosis Date  . Hypertension   . History of blood transfusion     "related to dialysis"  . Dyspnea     a. 07/23/11 Echo: EF 55-60%  . Anemia of chronic disease   . Cholecystitis     a. 08/27/2011  . Constipation   . Pericardial effusion     a.  Small by CT 08/22/11;  b.  Large by CT 08/27/11  . Inguinal lymphadenopathy     a. bilateral - s/p biopsy 07/2011  . Fibromyalgia   . Hx of echocardiogram     Echo (11/15):  Mild LVH, EF 60-65%, no RWMA, Gr 1 DD, MAC, mild LAE, normal RVF, mild RAE  . Hx of cardiovascular stress test     Lexiscan Myoview (11/15):  Inf, apical cap and apical lateral ischemia, EF 56%, inf HK, Moderate Risk  . High cholesterol   . Pneumonia ~ 2007  .  OSA (obstructive sleep apnea)     "I'm not doing the mask; claustrophobic"  . Claustrophobia   . Hypothyroidism   . Insulin dependent diabetes mellitus   . GERD (gastroesophageal reflux disease)   . Arthritis     "hands, right knee" (03/19/2014)  . ESRD (end stage renal disease)     a. 1995 s/p cadaveric transplant w/ susbequent failure after 18 yrs;  b.Dialysis initiated 07/2011  . CAD (coronary artery disease)     a. LHC (11/15):  mid to dist Dx 80%, mid RCA 99% (functional CTO), dist RCA 80% >> PCI: balloon angioplasty to mid RCA (could not deliver stent) - plan staged PCI with HSRA of RCA; Dx to be tx medically     Current Outpatient Prescriptions  Medication Sig Dispense Refill  . allopurinol (ZYLOPRIM) 100 MG tablet Take 200 mg by mouth daily.     [name] Kitchen aspirin 81 MG tablet Take 81 mg by mouth every evening.     [name] Kitchen  atorvastatin (LIPITOR) 10 MG tablet Take 10 mg by mouth every evening.     . calcium acetate (PHOSLO) 667 MG capsule Take 1,334-2,001 mg by mouth See admin instructions. Take 2,001 mg (3 capsules) with meals and 1,334 mg (2 capsules) with snacks    . clopidogrel (PLAVIX) 75 MG tablet Take 1 tablet (75 mg total) by mouth daily with breakfast. 30 tablet 12  . diphenhydrAMINE (BENADRYL) 25 mg capsule Take 1 capsule (25 mg total) by mouth every 6 (six) hours as needed for itching. 30 capsule 0  . docusate sodium (COLACE) 100 MG capsule Take 200 mg by mouth 3 (three) times daily.    . furosemide (LASIX) 80 MG tablet Take 160 mg by mouth 2 (two) times daily.     [name] Kitchen GLUCAGON EMERGENCY 1 MG injection Inject 1 mg into the muscle daily as needed (low blood sugar).     . hydrOXYzine (VISTARIL) 25 MG capsule Take 25 mg by mouth 3 (three) times daily as needed for itching.    . Insulin Human (INSULIN PUMP) 100 unit/ml SOLN Inject into the skin continuous. Humalog    . isosorbide mononitrate (IMDUR) 30 MG 24 hr tablet Take 1 tablet (30 mg total) by mouth daily. 30 tablet 11  . levothyroxine (SYNTHROID, LEVOTHROID) 100 MCG tablet Take 100 mcg by mouth daily before breakfast.    . loratadine (CLARITIN) 10 MG tablet Take 10 mg by mouth daily.    . methylPREDNISolone (MEDROL DOSEPAK) 4 MG tablet Take as directed 21 tablet 0  . metoCLOPramide (REGLAN) 5 MG tablet Take 5 mg by mouth 4 (four) times daily - after meals and at bedtime.    . metoprolol succinate (TOPROL-XL) 25 MG 24 hr tablet Take 0.5 tablets (12.5 mg total) by mouth daily. 15 tablet 12  . ranitidine (ZANTAC) 150 MG tablet Take 150 mg by mouth 2 (two) times daily.    [name] Kitchen rOPINIRole (REQUIP) 2 MG tablet Take 2 mg by mouth daily with supper.     [name] Kitchen HYDROmorphone (DILAUDID) 2 MG tablet Take 2 mg by mouth 3 (three) times daily as needed for severe pain.     . nitroGLYCERIN (NITROSTAT) 0.4 MG SL tablet Place 0.4 mg under the tongue every 5 (five) minutes as needed  for chest pain.     . promethazine (PHENERGAN) 25 MG tablet Take 25 mg by mouth Every 6 hours as needed for nausea or vomiting. For nausea     No current facility-administered medications for this visit.  Allergies:   Doxycycline; Hydrocodone; Morphine and related; Amoxicillin; Ciprofloxacin; Clindamycin/lincomycin; Codeine; Levofloxacin; Vasotec; Contrast media; and Oxycodone   Social History:  The patient  reports that he has never smoked. He has never used smokeless tobacco. He reports that he drinks alcohol. He reports that he does not use illicit drugs.   Family History:  [name] family history includes Breast cancer in his maternal aunt; Cancer in his father and maternal aunt; Colon polyps in his father; Diabetes in his father; Hypertension in his father, father, and mother. There is no history of Malignant hyperthermia, Heart attack, or Stroke.   ROS:  Please see the history of present illness.     All other systems reviewed and negative.    PHYSICAL EXAM: VS:  BP 150/80 mmHg  Pulse 76  Ht 5\' 8"  (1.727 m)  Wt 281 lb (127.461 kg)  BMI 42.74 kg/m2 Well nourished, well developed, in no acute distress HEENT: normal Neck:  I cannot assess JVD at 90  Cardiac:  normal S1, S2;  RRR; no murmur Lungs:   clear to auscultation bilaterally, no wheezing, rhonchi or rales Abd: soft, nontender, no hepatomegaly Ext:  Tr-1+ RLE  Edema; LLE prosthesis in place Skin: warm and dry, diffuse macular rash on bilateral UE and abdomen Neuro:  CNs 2-12 intact, no focal abnormalities noted  EKG:   NSR, HR 76, normal axis, NSSTTW changes, no change from prior tracing.    ASSESSMENT AND PLAN:  1.  Coronary Artery Disease:  He remains stable.  Plan is to proceed with HSRA of the RCA next week.  However, he is having a significant allergy.  Question if this is from the dye or the Plavix.  I have reviewed with Dr. Lauree Chandler today by phone.      -  Continue ASA, statin, beta  blocker.    -  DC Plavix.    -  He has no hx of CVA.  Start Effient 10 mg QD.    -  Plan PCI next week.  He will call the day before to apprise [place] of his rash.    -  If his rash is no better, may need to consider postponing his PCI until his allergic reaction is better controlled.  Need to make sure he can take dual antiplatelet Rx with such a complex PCI. 2.  Essential hypertension:   BP elevated.  May be from pruritus.  Monitor for now.  3.  ESRD (end stage renal disease):   Monday, Wednesday, Friday dialysis.   4.  Chronic Diastolic CHF:   Volume management per dialysis.    5.  Diabetes Mellitus:  He will need to adjust his insulin pump while NPO for his cath.  6.  IV Dye Allergy/Rash:  Change medications as noted above.  Will give him Prednisone 40 mg QD to take for now.  This will need to be tapered off after his PCI.  This can be done at DC.    Disposition:   FU with Dr. [name] or me 2 weeks after PCI.    Signed, Versie Starks, MHS 04/03/2014 4:09 PM    [facility] [address], [place], [place]  [address] Phone: [phone]; Fax: [phone]

## 2014-04-03 NOTE — Patient Instructions (Signed)
Your physician recommends that you schedule a follow-up appointment in: Tuesday, 04/29/2014 @ 3:40PM WITH SCOTT WEAVER, [facility]  Your physician recommends that you return for lab work in: TODAY: BMET, INR, CBC  Your physician has recommended you make the following change in your medication:  1) STOP TAKING PLAVIX  2) START TAKING EFFIENT 10 MG DAILY  3) START TAKING PREDNISONE 40 MG DAILY X 1 MONTH  Your physician has requested that you have a cardiac catheterization. Cardiac catheterization is used to diagnose and/or treat various heart conditions. Doctors may recommend this procedure for a number of different reasons. The most common reason is to evaluate chest pain. Chest pain can be a symptom of coronary artery disease (CAD), and cardiac catheterization can show whether plaque is narrowing or blocking your heart's arteries. This procedure is also used to evaluate the valves, as well as measure the blood flow and oxygen levels in different parts of your heart. For further information please visit [email or web address]. Please follow instruction sheet, as given.

## 2014-04-03 NOTE — Progress Notes (Signed)
Cardiology Office Note   Date:  04/03/2014   ID:  Dale [name], DOB 04-28-1965, MRN [id number]  PCP:  Windy Kalata, MD  Cardiologist:  Dr. Lauree Chandler     History of Present Illness: Dale [name] is a 48 y.o. male with a history of diabetes, HTN, sleep apnea, ESRD, fibromyalgia, chronic anemia, status post left BKA. He underwent renal transplant and had been off of dialysis.  He was seen by Dr. Lauree Chandler in 05/2012 in the hospital for chest pain. Diagnostic options were limited as cardiac catheterization would have been detrimental to his transplanted kidney. Therefore, stress testing was not thought to be helpful. Medical therapy was recommended. Further ischemic evaluation could be pursued if he had recurrent symptoms.   Admitted 06/2012 with severe septic shock secondary to Salmonella bacteremia.  He developed worsening renal function and hemodialysis was resumed.  I saw him 10/27 for further evaluation of dyspnea with exertion. He denied chest discomfort. He had no improvement with reduced dry weight.  Echo was obtained and this demonstrated normal LVF and mild diastolic dysfunction.  A myoview was obtained and this was abnormal with inferior, apical and apical lateral ischemia (mod risk).  LHC was arranged and this demonstrated severe RCA stenosis that was treated with POBA.  It was felt he should be brought back in 2-3 weeks for rotablator atherectomy of the RCA.  Severe disease in the Dx would be treated medically.  Patient had significant IV dye allergic reaction post PCI.  He was DC on Prednisone dose pack.  He returns for FU.  He is still having a pruritic rash.  It is on his arms and abdomen.  It was no better with the prednisone.  He denies chest pain.  His breathing is about the same.  He had some increased dyspnea this past weekend.  He usually feels worse on the weekends.  He denies syncope.  No fever, cough, melena, hematochezia.      Recent  Labs/Images:  Recent Labs  06/17/13 0625  03/20/14 0323  NA 132*  < > 137  K 4.4  < > 4.3  BUN 55*  < > 56*  CREATININE 7.44*  < > 6.45*  ALT 15  --   --   HGB 10.1*  < > 11.0*  < > = values in this interval not displayed.     Wt Readings from Last 3 Encounters:  04/03/14 281 lb (127.461 kg)  03/20/14 276 lb 0.3 oz (125.2 kg)  03/06/14 285 lb (129.275 kg)     Past Medical History  Diagnosis Date  . Hypertension   . History of blood transfusion     "related to dialysis"  . Dyspnea     a. 07/23/11 Echo: EF 55-60%  . Anemia of chronic disease   . Cholecystitis     a. 08/27/2011  . Constipation   . Pericardial effusion     a.  Small by CT 08/22/11;  b.  Large by CT 08/27/11  . Inguinal lymphadenopathy     a. bilateral - s/p biopsy 07/2011  . Fibromyalgia   . Hx of echocardiogram     Echo (11/15):  Mild LVH, EF 60-65%, no RWMA, Gr 1 DD, MAC, mild LAE, normal RVF, mild RAE  . Hx of cardiovascular stress test     Lexiscan Myoview (11/15):  Inf, apical cap and apical lateral ischemia, EF 56%, inf HK, Moderate Risk  . High cholesterol   . Pneumonia ~ 2007  .  OSA (obstructive sleep apnea)     "I'm not doing the mask; claustrophobic"  . Claustrophobia   . Hypothyroidism   . Insulin dependent diabetes mellitus   . GERD (gastroesophageal reflux disease)   . Arthritis     "hands, right knee" (03/19/2014)  . ESRD (end stage renal disease)     a. 1995 s/p cadaveric transplant w/ susbequent failure after 18 yrs;  b.Dialysis initiated 07/2011  . CAD (coronary artery disease)     a. LHC (11/15):  mid to dist Dx 80%, mid RCA 99% (functional CTO), dist RCA 80% >> PCI: balloon angioplasty to mid RCA (could not deliver stent) - plan staged PCI with HSRA of RCA; Dx to be tx medically     Current Outpatient Prescriptions  Medication Sig Dispense Refill  . allopurinol (ZYLOPRIM) 100 MG tablet Take 200 mg by mouth daily.     [name] Kitchen aspirin 81 MG tablet Take 81 mg by mouth every evening.     [name] Kitchen  atorvastatin (LIPITOR) 10 MG tablet Take 10 mg by mouth every evening.     . calcium acetate (PHOSLO) 667 MG capsule Take 1,334-2,001 mg by mouth See admin instructions. Take 2,001 mg (3 capsules) with meals and 1,334 mg (2 capsules) with snacks    . clopidogrel (PLAVIX) 75 MG tablet Take 1 tablet (75 mg total) by mouth daily with breakfast. 30 tablet 12  . diphenhydrAMINE (BENADRYL) 25 mg capsule Take 1 capsule (25 mg total) by mouth every 6 (six) hours as needed for itching. 30 capsule 0  . docusate sodium (COLACE) 100 MG capsule Take 200 mg by mouth 3 (three) times daily.    . furosemide (LASIX) 80 MG tablet Take 160 mg by mouth 2 (two) times daily.     [name] Kitchen GLUCAGON EMERGENCY 1 MG injection Inject 1 mg into the muscle daily as needed (low blood sugar).     . hydrOXYzine (VISTARIL) 25 MG capsule Take 25 mg by mouth 3 (three) times daily as needed for itching.    . Insulin Human (INSULIN PUMP) 100 unit/ml SOLN Inject into the skin continuous. Humalog    . isosorbide mononitrate (IMDUR) 30 MG 24 hr tablet Take 1 tablet (30 mg total) by mouth daily. 30 tablet 11  . levothyroxine (SYNTHROID, LEVOTHROID) 100 MCG tablet Take 100 mcg by mouth daily before breakfast.    . loratadine (CLARITIN) 10 MG tablet Take 10 mg by mouth daily.    . methylPREDNISolone (MEDROL DOSEPAK) 4 MG tablet Take as directed 21 tablet 0  . metoCLOPramide (REGLAN) 5 MG tablet Take 5 mg by mouth 4 (four) times daily - after meals and at bedtime.    . metoprolol succinate (TOPROL-XL) 25 MG 24 hr tablet Take 0.5 tablets (12.5 mg total) by mouth daily. 15 tablet 12  . ranitidine (ZANTAC) 150 MG tablet Take 150 mg by mouth 2 (two) times daily.    [name] Kitchen rOPINIRole (REQUIP) 2 MG tablet Take 2 mg by mouth daily with supper.     [name] Kitchen HYDROmorphone (DILAUDID) 2 MG tablet Take 2 mg by mouth 3 (three) times daily as needed for severe pain.     . nitroGLYCERIN (NITROSTAT) 0.4 MG SL tablet Place 0.4 mg under the tongue every 5 (five) minutes as needed  for chest pain.     . promethazine (PHENERGAN) 25 MG tablet Take 25 mg by mouth Every 6 hours as needed for nausea or vomiting. For nausea     No current facility-administered medications for this visit.  Allergies:   Doxycycline; Hydrocodone; Morphine and related; Amoxicillin; Ciprofloxacin; Clindamycin/lincomycin; Codeine; Levofloxacin; Vasotec; Contrast media; and Oxycodone   Social History:  The patient  reports that he has never smoked. He has never used smokeless tobacco. He reports that he drinks alcohol. He reports that he does not use illicit drugs.   Family History:  [name] family history includes Breast cancer in his maternal aunt; Cancer in his father and maternal aunt; Colon polyps in his father; Diabetes in his father; Hypertension in his father, father, and mother. There is no history of Malignant hyperthermia, Heart attack, or Stroke.   ROS:  Please see the history of present illness.     All other systems reviewed and negative.    PHYSICAL EXAM: VS:  BP 150/80 mmHg  Pulse 76  Ht 5\' 8"  (1.727 m)  Wt 281 lb (127.461 kg)  BMI 42.74 kg/m2 Well nourished, well developed, in no acute distress HEENT: normal Neck:  I cannot assess JVD at 90  Cardiac:  normal S1, S2;  RRR; no murmur Lungs:   clear to auscultation bilaterally, no wheezing, rhonchi or rales Abd: soft, nontender, no hepatomegaly Ext:  Tr-1+ RLE  Edema; LLE prosthesis in place Skin: warm and dry, diffuse macular rash on bilateral UE and abdomen Neuro:  CNs 2-12 intact, no focal abnormalities noted  EKG:   NSR, HR 76, normal axis, NSSTTW changes, no change from prior tracing.    ASSESSMENT AND PLAN:  1.  Coronary Artery Disease:  He remains stable.  Plan is to proceed with HSRA of the RCA next week.  However, he is having a significant allergy.  Question if this is from the dye or the Plavix.  I have reviewed with Dr. Lauree Chandler today by phone.      -  Continue ASA, statin, beta  blocker.    -  DC Plavix.    -  He has no hx of CVA.  Start Effient 10 mg QD.    -  Plan PCI next week.  He will call the day before to apprise [place] of his rash.    -  If his rash is no better, may need to consider postponing his PCI until his allergic reaction is better controlled.  Need to make sure he can take dual antiplatelet Rx with such a complex PCI. 2.  Essential hypertension:   BP elevated.  May be from pruritus.  Monitor for now.  3.  ESRD (end stage renal disease):   Monday, Wednesday, Friday dialysis.   4.  Chronic Diastolic CHF:   Volume management per dialysis.    5.  Diabetes Mellitus:  He will need to adjust his insulin pump while NPO for his cath.  6.  IV Dye Allergy/Rash:  Change medications as noted above.  Will give him Prednisone 40 mg QD to take for now.  This will need to be tapered off after his PCI.  This can be done at DC.   7.  Hyperlipidemia: Continue statin.  Disposition:   FU with Dr. [name] or me 2 weeks after PCI.    Signed, Versie Starks, MHS 04/03/2014 4:09 PM    [facility] [place], [place], [place]  [address] Phone: [phone]; Fax: [phone]

## 2014-04-04 LAB — BASIC METABOLIC PANEL
BUN: 42 mg/dL — ABNORMAL HIGH (ref 6–23)
CO2: 29 mEq/L (ref 19–32)
Calcium: 8.8 mg/dL (ref 8.4–10.5)
Chloride: 102 mEq/L (ref 96–112)
Creatinine, Ser: 4.9 mg/dL (ref 0.4–1.5)
GFR: 13.4 mL/min — CL (ref >60.00)
Glucose, Bld: 135 mg/dL — ABNORMAL HIGH (ref 70–99)
Potassium: 4.6 mEq/L (ref 3.5–5.1)
Sodium: 137 mEq/L (ref 135–145)

## 2014-04-04 LAB — CBC WITH DIFFERENTIAL/PLATELET
Basophils Absolute: 0 10*3/uL (ref 0.0–0.1)
Basophils Relative: 0.4 % (ref 0.0–3.0)
Eosinophils Absolute: 1.4 10*3/uL — ABNORMAL HIGH (ref 0.0–0.7)
Eosinophils Relative: 14.9 % — ABNORMAL HIGH (ref 0.0–5.0)
HCT: 35.6 % — ABNORMAL LOW (ref 39.0–52.0)
Hemoglobin: 11.2 g/dL — ABNORMAL LOW (ref 13.0–17.0)
Lymphocytes Relative: 17.2 % (ref 12.0–46.0)
Lymphs Abs: 1.6 10*3/uL (ref 0.7–4.0)
MCHC: 31.5 g/dL (ref 30.0–36.0)
MCV: 100.8 fl — ABNORMAL HIGH (ref 78.0–100.0)
Monocytes Absolute: 0.4 10*3/uL (ref 0.1–1.0)
Monocytes Relative: 4.1 % (ref 3.0–12.0)
Neutro Abs: 6 10*3/uL (ref 1.4–7.7)
Neutrophils Relative %: 63.4 % (ref 43.0–77.0)
Platelets: 246 10*3/uL (ref 150.0–400.0)
RBC: 3.53 Mil/uL — ABNORMAL LOW (ref 4.22–5.81)
RDW: 16.9 % — ABNORMAL HIGH (ref 11.5–15.5)
WBC: 9.5 10*3/uL (ref 4.0–10.5)

## 2014-04-04 LAB — PROTIME-INR
INR: 1.1 ratio — ABNORMAL HIGH (ref 0.8–1.0)
Prothrombin Time: 12.3 s (ref 9.6–13.1)

## 2014-04-08 ENCOUNTER — Telehealth: Payer: Self-pay | Admitting: *Deleted

## 2014-04-08 NOTE — Telephone Encounter (Signed)
Dale [name], Dale [name] s/w pt this monring to see how his rash was doing. Per Brynda Rim. PA  states sounds like rash is reaction from Plavix. Plavix d/c'd, start Effient 10 mg daily, samples at front desk, pt aware ok to cont. w/cath 12/16. We will try to obtain Prior Auth for the Effient or through the Assistance Program for Effient. Pt verbalized understanding to Plan of Care from [place], Dale [name].

## 2014-04-08 NOTE — Progress Notes (Signed)
   I called Dale [name] at home today to discuss his allergic reaction. The rash is much improved. He notes his skin is peeling somewhat now. His pruritis is almost resolved.  I suspect that he has a true allergy to Plavix.   Continue Effient. We will try to get him assistance as well as samples due to the cost.  Proceed as planned for PCI tomorrow.   Signed,  Versie Starks, MHS 04/08/2014 1:08 PM    [facility] [place], [place], [place]  [address] Phone: [phone]; Fax: [phone]   I have reviewed this with Dale Dopp, PA-C. Will plan PCI of the RCA tomorrow.   Dale [name] 04/08/2014 2:53 PM

## 2014-04-09 ENCOUNTER — Encounter (HOSPITAL_COMMUNITY)
Admission: RE | Disposition: A | Discharge status: Home / Self Care | Payer: Medicare Other | Source: Ambulatory Visit | Attending: Cardiovascular Disease

## 2014-04-09 ENCOUNTER — Encounter (HOSPITAL_COMMUNITY): Payer: Self-pay | Admitting: General Practice

## 2014-04-09 ENCOUNTER — Ambulatory Visit (HOSPITAL_COMMUNITY)
Admission: RE | Admit: 2014-04-09 | Discharge: 2014-04-11 | Disposition: A | Discharge status: Home / Self Care | Payer: Medicare Other | Source: Ambulatory Visit | Attending: Cardiovascular Disease | Admitting: Cardiovascular Disease

## 2014-04-09 DIAGNOSIS — M797 Fibromyalgia: Secondary | ICD-10-CM | POA: Insufficient documentation

## 2014-04-09 DIAGNOSIS — I2584 Coronary atherosclerosis due to calcified coronary lesion: Secondary | ICD-10-CM | POA: Insufficient documentation

## 2014-04-09 DIAGNOSIS — Z89512 Acquired absence of left leg below knee: Secondary | ICD-10-CM | POA: Insufficient documentation

## 2014-04-09 DIAGNOSIS — I25110 Atherosclerotic heart disease of native coronary artery with unstable angina pectoris: Secondary | ICD-10-CM | POA: Insufficient documentation

## 2014-04-09 DIAGNOSIS — E119 Type 2 diabetes mellitus without complications: Secondary | ICD-10-CM | POA: Insufficient documentation

## 2014-04-09 DIAGNOSIS — R0600 Dyspnea, unspecified: Secondary | ICD-10-CM

## 2014-04-09 DIAGNOSIS — D631 Anemia in chronic kidney disease: Secondary | ICD-10-CM | POA: Insufficient documentation

## 2014-04-09 DIAGNOSIS — I120 Hypertensive chronic kidney disease with stage 5 chronic kidney disease or end stage renal disease: Secondary | ICD-10-CM | POA: Diagnosis not present

## 2014-04-09 DIAGNOSIS — Z940 Kidney transplant status: Secondary | ICD-10-CM | POA: Insufficient documentation

## 2014-04-09 DIAGNOSIS — Z91041 Radiographic dye allergy status: Secondary | ICD-10-CM | POA: Insufficient documentation

## 2014-04-09 DIAGNOSIS — I214 Non-ST elevation (NSTEMI) myocardial infarction: Secondary | ICD-10-CM | POA: Diagnosis present

## 2014-04-09 DIAGNOSIS — N186 End stage renal disease: Secondary | ICD-10-CM | POA: Diagnosis not present

## 2014-04-09 DIAGNOSIS — I200 Unstable angina: Secondary | ICD-10-CM

## 2014-04-09 DIAGNOSIS — Z992 Dependence on renal dialysis: Secondary | ICD-10-CM | POA: Insufficient documentation

## 2014-04-09 HISTORY — PX: PERCUTANEOUS CORONARY ROTOBLATOR INTERVENTION (PCI-R): SHX5484

## 2014-04-09 HISTORY — PX: ANGIOPLASTY: SHX39

## 2014-04-09 HISTORY — PX: CORONARY ANGIOPLASTY WITH STENT PLACEMENT: SHX49

## 2014-04-09 HISTORY — PX: FEMORAL ARTERY REPAIR: SHX1582

## 2014-04-09 LAB — GLUCOSE, CAPILLARY
Glucose-Capillary: 130 mg/dL — ABNORMAL HIGH (ref 70–99)
Glucose-Capillary: 237 mg/dL — ABNORMAL HIGH (ref 70–99)
Glucose-Capillary: 217 mg/dL — ABNORMAL HIGH (ref 70–99)
Glucose-Capillary: 233 mg/dL — ABNORMAL HIGH (ref 70–99)

## 2014-04-09 LAB — RENAL FUNCTION PANEL
Albumin: 3.1 g/dL — ABNORMAL LOW (ref 3.5–5.2)
Anion gap: 18 — ABNORMAL HIGH (ref 5–15)
BUN: 79 mg/dL — ABNORMAL HIGH (ref 6–23)
CO2: 20 mEq/L (ref 19–32)
Calcium: 8.9 mg/dL (ref 8.4–10.5)
Chloride: 97 mEq/L (ref 96–112)
Creatinine, Ser: 6 mg/dL — ABNORMAL HIGH (ref 0.50–1.35)
GFR calc Af Amer: 12 mL/min — ABNORMAL LOW (ref >90)
GFR calc non Af Amer: 10 mL/min — ABNORMAL LOW (ref >90)
Glucose, Bld: 279 mg/dL — ABNORMAL HIGH (ref 70–99)
Phosphorus: 6.2 mg/dL — ABNORMAL HIGH (ref 2.3–4.6)
Potassium: 5.5 mEq/L — ABNORMAL HIGH (ref 3.7–5.3)
Sodium: 135 mEq/L — ABNORMAL LOW (ref 137–147)

## 2014-04-09 LAB — CBC
HCT: 31.8 % — ABNORMAL LOW (ref 39.0–52.0)
Hemoglobin: 10 g/dL — ABNORMAL LOW (ref 13.0–17.0)
MCH: 31.2 pg (ref 26.0–34.0)
MCHC: 31.4 g/dL (ref 30.0–36.0)
MCV: 99.1 fL (ref 78.0–100.0)
Platelets: 228 10*3/uL (ref 150–400)
RBC: 3.21 MIL/uL — ABNORMAL LOW (ref 4.22–5.81)
RDW: 15.2 % (ref 11.5–15.5)
WBC: 9 10*3/uL (ref 4.0–10.5)

## 2014-04-09 LAB — POCT ACTIVATED CLOTTING TIME: Activated Clotting Time: 356 seconds

## 2014-04-09 SURGERY — PERCUTANEOUS CORONARY ROTOBLATOR INTERVENTION (PCI-R)
Anesthesia: LOCAL

## 2014-04-09 MED ORDER — HEPARIN SODIUM (PORCINE) 1000 UNIT/ML IJ SOLN
INTRAMUSCULAR | Status: AC
  Filled 2014-04-09: qty 1

## 2014-04-09 MED ORDER — FAMOTIDINE IN NACL 20-0.9 MG/50ML-% IV SOLN
20.0000 mg | INTRAVENOUS | Status: AC
  Administered 2014-04-09: 20 mg via INTRAVENOUS

## 2014-04-09 MED ORDER — HYDROMORPHONE HCL 1 MG/ML IJ SOLN
1.0000 mg | Freq: Once | INTRAMUSCULAR | Status: AC
  Administered 2014-04-09: 1 mg via INTRAVENOUS

## 2014-04-09 MED ORDER — SODIUM CHLORIDE 0.9 % IV SOLN
INTRAVENOUS | Status: DC
  Administered 2014-04-09: 1000 mL via INTRAVENOUS

## 2014-04-09 MED ORDER — PREDNISONE 20 MG PO TABS
40.0000 mg | ORAL_TABLET | Freq: Every day | ORAL | Status: DC
  Administered 2014-04-10 – 2014-04-11 (×2): 40 mg via ORAL
  Filled 2014-04-09 (×4): qty 2

## 2014-04-09 MED ORDER — DIPHENHYDRAMINE HCL 25 MG PO CAPS
25.0000 mg | ORAL_CAPSULE | Freq: Four times a day (QID) | ORAL | Status: DC | PRN
  Administered 2014-04-09 – 2014-04-11 (×3): 25 mg via ORAL
  Filled 2014-04-09 (×2): qty 1

## 2014-04-09 MED ORDER — ATORVASTATIN CALCIUM 10 MG PO TABS
10.0000 mg | ORAL_TABLET | Freq: Every evening | ORAL | Status: DC
  Administered 2014-04-09 – 2014-04-10 (×2): 10 mg via ORAL
  Filled 2014-04-09 (×3): qty 1

## 2014-04-09 MED ORDER — METHYLPREDNISOLONE SODIUM SUCC 125 MG IJ SOLR
125.0000 mg | Freq: Once | INTRAMUSCULAR | Status: AC
  Administered 2014-04-09: 125 mg via INTRAVENOUS

## 2014-04-09 MED ORDER — METOPROLOL SUCCINATE 12.5 MG HALF TABLET
12.5000 mg | ORAL_TABLET | Freq: Every day | ORAL | Status: DC
  Administered 2014-04-09 – 2014-04-10 (×2): 12.5 mg via ORAL
  Filled 2014-04-09 (×3): qty 1

## 2014-04-09 MED ORDER — HYDROMORPHONE HCL 1 MG/ML IJ SOLN
1.0000 mg | INTRAMUSCULAR | Status: DC | PRN
  Administered 2014-04-09 – 2014-04-10 (×5): 1 mg via INTRAVENOUS
  Filled 2014-04-09 (×3): qty 1

## 2014-04-09 MED ORDER — ACETAMINOPHEN 325 MG PO TABS
650.0000 mg | ORAL_TABLET | ORAL | Status: DC | PRN
Start: 2014-04-09 — End: 2014-04-11

## 2014-04-09 MED ORDER — ISOSORBIDE MONONITRATE ER 30 MG PO TB24
30.0000 mg | ORAL_TABLET | Freq: Every day | ORAL | Status: DC
  Administered 2014-04-09 – 2014-04-11 (×3): 30 mg via ORAL
  Filled 2014-04-09 (×3): qty 1

## 2014-04-09 MED ORDER — NEPRO/CARBSTEADY PO LIQD
237.0000 mL | ORAL | Status: DC | PRN
  Filled 2014-04-09: qty 237

## 2014-04-09 MED ORDER — INSULIN PUMP: SUBCUTANEOUS | Status: DC

## 2014-04-09 MED ORDER — HYDROMORPHONE HCL 1 MG/ML IJ SOLN
INTRAMUSCULAR | Status: AC
  Filled 2014-04-09: qty 1

## 2014-04-09 MED ORDER — DIPHENHYDRAMINE HCL 50 MG/ML IJ SOLN
INTRAMUSCULAR | Status: AC
  Administered 2014-04-09: 25 mg via INTRAVENOUS
  Filled 2014-04-09: qty 1

## 2014-04-09 MED ORDER — CALCIUM ACETATE 667 MG PO CAPS
2001.0000 mg | ORAL_CAPSULE | Freq: Three times a day (TID) | ORAL | Status: DC
  Administered 2014-04-09 – 2014-04-11 (×5): 2001 mg via ORAL
  Filled 2014-04-09 (×8): qty 3

## 2014-04-09 MED ORDER — ONDANSETRON HCL 4 MG/2ML IJ SOLN: 4.0000 mg | Freq: Four times a day (QID) | INTRAMUSCULAR | Status: DC | PRN

## 2014-04-09 MED ORDER — LIDOCAINE HCL (PF) 1 % IJ SOLN
INTRAMUSCULAR | Status: AC
  Filled 2014-04-09: qty 30

## 2014-04-09 MED ORDER — PENTAFLUOROPROP-TETRAFLUOROETH EX AERO: 1.0000 "application " | INHALATION_SPRAY | CUTANEOUS | Status: DC | PRN

## 2014-04-09 MED ORDER — VERAPAMIL HCL 2.5 MG/ML IV SOLN
INTRAVENOUS | Status: AC
  Filled 2014-04-09: qty 2

## 2014-04-09 MED ORDER — BIVALIRUDIN 250 MG IV SOLR
INTRAVENOUS | Status: AC
  Filled 2014-04-09: qty 250

## 2014-04-09 MED ORDER — HEPARIN SODIUM (PORCINE) 1000 UNIT/ML DIALYSIS
1000.0000 [IU] | INTRAMUSCULAR | Status: DC | PRN
  Filled 2014-04-09: qty 1

## 2014-04-09 MED ORDER — HEPARIN (PORCINE) IN NACL 2-0.9 UNIT/ML-% IJ SOLN
INTRAMUSCULAR | Status: AC
Start: 2014-04-09 — End: 2014-04-09
  Filled 2014-04-09: qty 1500

## 2014-04-09 MED ORDER — NITROGLYCERIN 0.4 MG SL SUBL
0.4000 mg | SUBLINGUAL_TABLET | SUBLINGUAL | Status: DC | PRN
Start: 2014-04-09 — End: 2014-04-11

## 2014-04-09 MED ORDER — SODIUM CHLORIDE 0.9 % IV SOLN: 250.0000 mL | INTRAVENOUS | Status: DC | PRN

## 2014-04-09 MED ORDER — DIPHENHYDRAMINE HCL 50 MG/ML IJ SOLN
25.0000 mg | Freq: Once | INTRAMUSCULAR | Status: AC
  Administered 2014-04-09: 25 mg via INTRAVENOUS

## 2014-04-09 MED ORDER — ASPIRIN 81 MG PO CHEW
CHEWABLE_TABLET | ORAL | Status: AC
  Administered 2014-04-09: 81 mg via ORAL
  Filled 2014-04-09: qty 1

## 2014-04-09 MED ORDER — LEVOTHYROXINE SODIUM 100 MCG PO TABS
100.0000 ug | ORAL_TABLET | Freq: Every day | ORAL | Status: DC
  Administered 2014-04-10: 100 ug via ORAL
  Filled 2014-04-09 (×3): qty 1

## 2014-04-09 MED ORDER — FAMOTIDINE IN NACL 20-0.9 MG/50ML-% IV SOLN
INTRAVENOUS | Status: AC
  Administered 2014-04-09: 20 mg via INTRAVENOUS
  Filled 2014-04-09: qty 50

## 2014-04-09 MED ORDER — METHYLPREDNISOLONE SODIUM SUCC 125 MG IJ SOLR
INTRAMUSCULAR | Status: AC
  Administered 2014-04-09: 125 mg via INTRAVENOUS
  Filled 2014-04-09: qty 2

## 2014-04-09 MED ORDER — ASPIRIN 81 MG PO CHEW
81.0000 mg | CHEWABLE_TABLET | Freq: Every day | ORAL | Status: DC
  Administered 2014-04-10 – 2014-04-11 (×2): 81 mg via ORAL
  Filled 2014-04-09 (×3): qty 1

## 2014-04-09 MED ORDER — ROPINIROLE HCL 1 MG PO TABS
2.0000 mg | ORAL_TABLET | Freq: Every day | ORAL | Status: DC
  Administered 2014-04-09 – 2014-04-10 (×2): 2 mg via ORAL
  Filled 2014-04-09 (×3): qty 2

## 2014-04-09 MED ORDER — ALTEPLASE 2 MG IJ SOLR
2.0000 mg | Freq: Once | INTRAMUSCULAR | Status: DC | PRN
  Filled 2014-04-09: qty 2

## 2014-04-09 MED ORDER — PRASUGREL HCL 10 MG PO TABS
10.0000 mg | ORAL_TABLET | Freq: Every day | ORAL | Status: DC
  Administered 2014-04-10 – 2014-04-11 (×2): 10 mg via ORAL
  Filled 2014-04-09 (×2): qty 1

## 2014-04-09 MED ORDER — ALLOPURINOL 100 MG PO TABS
200.0000 mg | ORAL_TABLET | Freq: Every day | ORAL | Status: DC
  Administered 2014-04-09 – 2014-04-11 (×3): 200 mg via ORAL
  Filled 2014-04-09 (×3): qty 2

## 2014-04-09 MED ORDER — DIPHENHYDRAMINE HCL 50 MG/ML IJ SOLN
25.0000 mg | INTRAMUSCULAR | Status: AC
  Administered 2014-04-09: 25 mg via INTRAVENOUS

## 2014-04-09 MED ORDER — DIPHENHYDRAMINE HCL 50 MG/ML IJ SOLN
INTRAMUSCULAR | Status: AC
  Filled 2014-04-09: qty 1

## 2014-04-09 MED ORDER — SODIUM CHLORIDE 0.9 % IJ SOLN: 3.0000 mL | INTRAMUSCULAR | Status: DC | PRN

## 2014-04-09 MED ORDER — FUROSEMIDE 80 MG PO TABS
160.0000 mg | ORAL_TABLET | Freq: Two times a day (BID) | ORAL | Status: DC
  Administered 2014-04-10 – 2014-04-11 (×3): 160 mg via ORAL
  Filled 2014-04-09 (×6): qty 2

## 2014-04-09 MED ORDER — FENTANYL CITRATE 0.05 MG/ML IJ SOLN
INTRAMUSCULAR | Status: AC
  Filled 2014-04-09: qty 2

## 2014-04-09 MED ORDER — SODIUM CHLORIDE 0.9 % IJ SOLN: 3.0000 mL | Freq: Two times a day (BID) | INTRAMUSCULAR | Status: DC

## 2014-04-09 MED ORDER — MIDAZOLAM HCL 2 MG/2ML IJ SOLN
INTRAMUSCULAR | Status: AC
  Filled 2014-04-09: qty 2

## 2014-04-09 MED ORDER — FAMOTIDINE 20 MG PO TABS
20.0000 mg | ORAL_TABLET | Freq: Once | ORAL | Status: AC
  Administered 2014-04-09: 23:00:00 20 mg via ORAL
  Filled 2014-04-09: qty 1

## 2014-04-09 MED ORDER — CALCIUM ACETATE 667 MG PO CAPS: 1334.0000 mg | ORAL_CAPSULE | ORAL | Status: DC

## 2014-04-09 MED ORDER — LIDOCAINE-PRILOCAINE 2.5-2.5 % EX CREA: 1.0000 "application " | TOPICAL_CREAM | CUTANEOUS | Status: DC | PRN

## 2014-04-09 MED ORDER — NITROGLYCERIN 1 MG/10 ML FOR IR/CATH LAB
INTRA_ARTERIAL | Status: AC
  Filled 2014-04-09: qty 10

## 2014-04-09 MED ORDER — DIPHENHYDRAMINE HCL 50 MG/ML IJ SOLN
25.0000 mg | Freq: Once | INTRAMUSCULAR | Status: AC
  Administered 2014-04-09: 25 mg via INTRAVENOUS
  Filled 2014-04-09: qty 1

## 2014-04-09 MED ORDER — LORATADINE 10 MG PO TABS
10.0000 mg | ORAL_TABLET | Freq: Every day | ORAL | Status: DC
  Administered 2014-04-09 – 2014-04-11 (×3): 10 mg via ORAL
  Filled 2014-04-09 (×3): qty 1

## 2014-04-09 MED ORDER — ASPIRIN 81 MG PO CHEW
81.0000 mg | CHEWABLE_TABLET | ORAL | Status: AC
  Administered 2014-04-09: 81 mg via ORAL

## 2014-04-09 MED ORDER — SODIUM CHLORIDE 0.9 % IV SOLN: 100.0000 mL | INTRAVENOUS | Status: DC | PRN

## 2014-04-09 MED ORDER — DOCUSATE SODIUM 100 MG PO CAPS
200.0000 mg | ORAL_CAPSULE | Freq: Three times a day (TID) | ORAL | Status: DC
  Administered 2014-04-09 – 2014-04-11 (×6): 200 mg via ORAL
  Filled 2014-04-09 (×8): qty 2

## 2014-04-09 MED ORDER — DIPHENHYDRAMINE HCL 25 MG PO CAPS
ORAL_CAPSULE | ORAL | Status: AC
  Filled 2014-04-09: qty 1

## 2014-04-09 MED ORDER — LIDOCAINE HCL (PF) 1 % IJ SOLN
5.0000 mL | INTRAMUSCULAR | Status: DC | PRN
Start: 2014-04-09 — End: 2014-04-09

## 2014-04-09 MED ORDER — METOCLOPRAMIDE HCL 5 MG PO TABS
5.0000 mg | ORAL_TABLET | Freq: Three times a day (TID) | ORAL | Status: DC
  Administered 2014-04-09 – 2014-04-11 (×6): 5 mg via ORAL
  Filled 2014-04-09 (×12): qty 1

## 2014-04-09 NOTE — Interval H&P Note (Signed)
History and Physical Interval Note:  04/09/2014 8:45 AM  Dale [name]  has presented today for cardiac cath with the diagnosis of CAD, unstable angina.  The various methods of treatment have been discussed with the patient and family. After consideration of risks, benefits and other options for treatment, the patient has consented to  Procedure(s): PERCUTANEOUS CORONARY ROTOBLATOR INTERVENTION (PCI-R) (N/A) as a surgical intervention .  [name] history has been reviewed, patient examined, no change in status, stable for surgery.  I have reviewed the patient's chart and labs.  Questions were answered to the patient's satisfaction.    Cath Lab Visit (complete for each Cath Lab visit)  Clinical Evaluation Leading to the Procedure:   ACS: No.  Non-ACS:    Anginal Classification: CCS III  Anti-ischemic medical therapy: Maximal Therapy (2 or more classes of medications)  Non-Invasive Test Results: No non-invasive testing performed  Prior CABG: No previous CABG         Dale [name]

## 2014-04-09 NOTE — Consult Note (Signed)
Renal Service Consult Note [facility]  Dale [name] 04/09/2014 [name] Requesting Physician:  Dr [name]  Reason for Consult:  ESRD patient s/p cardiac cath and PCI today HPI: The patient is a 48 y.o. year-old with hx of DM, HTN and ESRD.  Had renal transplant for many years from 1995 until 2013 when he was started back on hemodialysis. Type 1 diabetic, age of onset 48 years old.  PVD with L AKA.  Was here  3 weeks ago with DOE and +Myoview, underwent heart cath which showed RCA disease w heavy CA++.  Is returning now for rotablator PCI which was done today, no complications immediately apparent. Post-cath pt is pleasant , no complaints. Had 6kg goal for HD on Monday per pt, they got 5kg.  No SOB or CP.   Chart review: 7/02 - chest pain, ruled out for MI, s/p renal Tx, HTN, DM  6/04 - sepsis d/t R thigh graft infection, AVG was removed, hx tranpslant, DM2, HTN 7/04 - RLE cellulitis rx w abx, renal Tx failure back on HD, staph UTI 5/05 - infected R heel with osteo > underwent calcaneal excision by Dr Sharol Given, CKD with renal Tx, DM type 1 age 48 4/06 - acute appendicitis underwent lap appendectomy, DM, renal Tx 8/06 - toe amp of one toe from each foot  11/06 - infected wound after L eft 5th ray amp, rx with I&D, abx, prbcs 3/13 - A/c renal failure started back on hemodialysis.   4/13 - constipation rx with laxatives 5/13 - abd pain , ruled out for acute cholecystitis with neg HIDA.  2/14 - aytpical CP, medical management as heart cath could harm transplant kidney 3/14 - salmonella bacteremia with septic shock, on vent, got HD 2/15 - acute resp failure, hypoxemia, influenza A rx with po abx and Tamiflu 11/25 - 03/20/14  > DOE, normal ECHO, +Myoview > had heart cath w severe RCA stenosis, not PCI's d/t heavy Ca++, to return in 2-3 weeks for rotablator procedure  ROS  no abd pain, n/v/d  no jt pain  no rash or swelling  Past Medical History  Past Medical History   Diagnosis Date  . Hypertension   . History of blood transfusion     "related to dialysis"  . Dyspnea     a. 07/23/11 Echo: EF 55-60%  . Anemia of chronic disease   . Cholecystitis     a. 08/27/2011  . Constipation   . Pericardial effusion     a.  Small by CT 08/22/11;  b.  Large by CT 08/27/11  . Inguinal lymphadenopathy     a. bilateral - s/p biopsy 07/2011  . Fibromyalgia   . Hx of echocardiogram     Echo (11/15):  Mild LVH, EF 60-65%, no RWMA, Gr 1 DD, MAC, mild LAE, normal RVF, mild RAE  . Hx of cardiovascular stress test     Lexiscan Myoview (11/15):  Inf, apical cap and apical lateral ischemia, EF 56%, inf HK, Moderate Risk  . High cholesterol   . Pneumonia ~ 2007  . OSA (obstructive sleep apnea)     "I'm not doing the mask; claustrophobic"  . Claustrophobia   . Hypothyroidism   . Insulin dependent diabetes mellitus   . GERD (gastroesophageal reflux disease)   . Arthritis     "hands, right knee" (03/19/2014)  . ESRD (end stage renal disease)     a. 1995 s/p cadaveric transplant w/ susbequent failure after 18 yrs;  b.Dialysis  initiated 07/2011  . CAD (coronary artery disease)     a. LHC (11/15):  mid to dist Dx 80%, mid RCA 99% (functional CTO), dist RCA 80% >> PCI: balloon angioplasty to mid RCA (could not deliver stent) - plan staged PCI with HSRA of RCA; Dx to be tx medically    Past Surgical History  Past Surgical History  Procedure Laterality Date  . Kidney transplant  04/25/1993  . Cataract extraction w/ intraocular lens  implant, bilateral Bilateral 1990's  . Arteriovenous graft placement Left 1993?    forearm  . Arteriovenous graft placement Right 1993?    leg  . Toe amputation Bilateral after 2005  . Below knee leg amputation Left 2010  . Carpal tunnel release Bilateral after 2005  . Shoulder arthroscopy Left ~ 2006    frozen  . Av fistula placement  07/29/2011    Procedure: ARTERIOVENOUS (AV) FISTULA CREATION;  Surgeon: Mal Misty, MD;  Location: [place];   Service: Vascular;  Laterality: Right;  Brachial cephalic  . Insertion of dialysis catheter  08/01/2011    Procedure: INSERTION OF DIALYSIS CATHETER;  Surgeon: Rosetta Posner, MD;  Location: [place];  Service: Vascular;  Laterality: Right;  insertion of dialysis catheter on right internal jugular vein  . Lymph node biopsy  08/10/2011    Procedure: LYMPH NODE BIOPSY;  Surgeon: Merrie Roof, MD;  Location: [place];  Service: General;  Laterality: Left;  left groin lymph biopsy  . Colonoscopy  08/29/2011    Procedure: COLONOSCOPY;  Surgeon: Jerene Bears, MD;  Location: [place];  Service: Gastroenterology;  Laterality: N/A;  . Appendectomy  ~ 2006  . Hernia repair    . Umbilical hernia repair  1990's  . Cardiac catheterization  03/19/2014  . Av fistula placement Left 1993?    "attempted one in my wrist; didn't take"  . Neuroplasty / transposition ulnar nerve at elbow Left after 2005  . Venogram Right 07/28/2011    Procedure: VENOGRAM;  Surgeon: Conrad Sneads, MD;  Location: [facility] CATH LAB;  Service: Cardiovascular;  Laterality: Right;  . Left heart catheterization with coronary angiogram N/A 03/19/2014    Procedure: LEFT HEART CATHETERIZATION WITH CORONARY ANGIOGRAM;  Surgeon: Burnell Blanks, MD;  Location: [facility] CATH LAB;  Service: Cardiovascular;  Laterality: N/A;  . Cardiac catheterization  03/19/2014    Procedure: CORONARY BALLOON ANGIOPLASTY;  Surgeon: Burnell Blanks, MD;  Location: [facility] CATH LAB;  Service: Cardiovascular;;  . Coronary angioplasty with stent placement  04/09/2014        ptca/des mid lad   . Angioplasty  04/09/2014    RCA   . Femoral artery repair  04/09/2014    ANGIOSEAL  . Eye surgery     Family History  Family History  Problem Relation Age of Onset  . Malignant hyperthermia Neg Hx   . Colon polyps Father   . Diabetes Father   . Cancer Father   . Breast cancer Maternal Aunt   . Cancer Maternal Aunt     Breast and Bone  . Heart attack Neg Hx   . Stroke Neg  Hx   . Hypertension Mother   . Hypertension Father   . Hypertension Father    Social History  reports that he has never smoked. He has never used smokeless tobacco. He reports that he drinks alcohol. He reports that he does not use illicit drugs. Allergies  Allergies  Allergen Reactions  . Doxycycline  RASH  . Hydrocodone     RASH  . Morphine And Related Hives and Nausea Only  . Amoxicillin Other (See Comments)    Reaction unknown  . Ciprofloxacin Other (See Comments)    Reaction unknown  . Clindamycin/Lincomycin Other (See Comments)    Reaction unknown  . Codeine Other (See Comments)    Reaction unknown. But pt has tolerated hydrocodone in Vicodin and Norco in the past  . Levofloxacin Other (See Comments)    Reaction unknown  . Vasotec Cough  . Contrast Media [Iodinated Diagnostic Agents] Rash  . Oxycodone Rash  . Plavix [Clopidogrel Bisulfate] Rash   Home medications Prior to Admission medications   Medication Sig Start Date End Date Taking? Authorizing Provider  allopurinol (ZYLOPRIM) 100 MG tablet Take 200 mg by mouth daily.  03/14/12  Yes Historical Provider, MD  aspirin 81 MG tablet Take 81 mg by mouth every evening.    Yes Historical Provider, MD  atorvastatin (LIPITOR) 10 MG tablet Take 10 mg by mouth every evening.    Yes Historical Provider, MD  calcium acetate (PHOSLO) 667 MG capsule Take 1,334-2,001 mg by mouth See admin instructions. Take 2,001 mg (3 capsules) with meals and 1,334 mg (2 capsules) with snacks 09/19/12  Yes Historical Provider, MD  diphenhydrAMINE (BENADRYL) 25 mg capsule Take 1 capsule (25 mg total) by mouth every 6 (six) hours as needed for itching. 03/20/14  Yes Liliane Shi, PA-C  docusate sodium (COLACE) 100 MG capsule Take 200 mg by mouth 3 (three) times daily.   Yes Historical Provider, MD  furosemide (LASIX) 80 MG tablet Take 160 mg by mouth 2 (two) times daily.  09/03/12  Yes Historical Provider, MD  GLUCAGON EMERGENCY 1 MG injection  Inject 1 mg into the muscle daily as needed (low blood sugar).  01/19/12  Yes Historical Provider, MD  HYDROmorphone (DILAUDID) 2 MG tablet Take 2 mg by mouth 3 (three) times daily as needed for severe pain.  09/03/12  Yes Historical Provider, MD  hydrOXYzine (VISTARIL) 25 MG capsule Take 25 mg by mouth 3 (three) times daily as needed for itching.   Yes Historical Provider, MD  Insulin Human (INSULIN PUMP) 100 unit/ml SOLN Inject into the skin continuous. Humalog   Yes Historical Provider, MD  isosorbide mononitrate (IMDUR) 30 MG 24 hr tablet Take 1 tablet (30 mg total) by mouth daily. 03/06/14  Yes Scott Joylene Draft, PA-C  levothyroxine (SYNTHROID, LEVOTHROID) 100 MCG tablet Take 100 mcg by mouth daily before breakfast.   Yes Historical Provider, MD  loratadine (CLARITIN) 10 MG tablet Take 10 mg by mouth daily.   Yes Historical Provider, MD  metoCLOPramide (REGLAN) 5 MG tablet Take 5 mg by mouth 4 (four) times daily - after meals and at bedtime. 05/26/11  Yes Historical Provider, MD  metoprolol succinate (TOPROL-XL) 25 MG 24 hr tablet Take 0.5 tablets (12.5 mg total) by mouth daily. 03/20/14  Yes Liliane Shi, PA-C  nitroGLYCERIN (NITROSTAT) 0.4 MG SL tablet Place 0.4 mg under the tongue every 5 (five) minutes as needed for chest pain.    Yes [name], MD  prasugrel (EFFIENT) 10 MG TABS tablet Take 1 tablet (10 mg total) by mouth daily. 04/03/14  Yes Liliane Shi, PA-C  predniSONE (DELTASONE) 20 MG tablet Take 2 tablets (40 mg total) by mouth daily with breakfast. 04/03/14  Yes Scott T Kathlen Mody, PA-C  ranitidine (ZANTAC) 150 MG tablet Take 150 mg by mouth 2 (two) times daily.  Yes Historical Provider, MD  rOPINIRole (REQUIP) 2 MG tablet Take 2 mg by mouth daily with supper.    Yes Historical Provider, MD  methylPREDNISolone (MEDROL DOSEPAK) 4 MG tablet Take as directed 03/20/14   Liliane Shi, PA-C  promethazine (PHENERGAN) 25 MG tablet Take 25 mg by mouth Every 6 hours as needed for nausea or  vomiting. For nausea 07/13/11   Historical Provider, MD   Liver Function Tests No results for input(s): AST, ALT, ALKPHOS, BILITOT, PROT, ALBUMIN in the last 168 hours. No results for input(s): LIPASE, AMYLASE in the last 168 hours. CBC  Recent Labs Lab 04/03/14 1710  WBC 9.5  NEUTROABS 6.0  HGB 11.2*  HCT 35.6*  MCV 100.8*  PLT 123XX123   Basic Metabolic Panel  Recent Labs Lab 04/03/14 1710  NA 137  K 4.6  CL 102  CO2 29  GLUCOSE 135*  BUN 42*  CREATININE 4.9*  CALCIUM 8.8    Filed Vitals:   04/09/14 1205 04/09/14 1210 04/09/14 1215 04/09/14 1256  BP: 187/85 173/78 164/82 192/90  Pulse: 76 76 75 79  Temp:    97.7 F (36.5 C)  TempSrc:    Oral  Resp: 12 10 12 18   Height:      Weight:      SpO2: 99% 98% 100% 100%   Exam Pleasant WM no distress No rash, cyanosis or gangrene Sclera anicteric, throat clear No JVD Chest clear bilat RRR no MRG Abd obese, soft, NTND, no mass or HSM L AKA intact, RLE atrophic skin changes, no sig edema Neuro is Ox 3, nF RUA AVF patent   HD: MWF [place] 3h 68min    450/600   123.5kg    4K/2.25 Bath    Profile 4   R arm AVF  Heparin 9000 Hectorol 2 ug TIW pth 173,  5, corr Ca 7 (usually 9's)  Assessment: 1. CAD s/p complex PCI to RCA x 2 lesions w one stent placed 2. ESRD on HD 3. Hx failed renal Tx 4. PVD with L AKA 5. DM type 1 6. HTN/ vol - up 1-2 kg, stable vol status 7. Anemia not on esa, Hb 11.2 8. MBD cont vit D   Plan- HD today, UF to dry wt, low dose hep  Kelly Splinter MD (pgr) [phone]    ([id number] 04/09/2014, 1:43 PM

## 2014-04-09 NOTE — Progress Notes (Signed)
Patients left femoral site checked at 1700 and no bleeding, hematoma or swelling noted, Up to bathroom and put on underwear. Rechecked site and noted small hematoma with no oozing. Pressure held dressing changed and site stable. Instructed to remain on bedrest during dialysis and report any warmth or wetness at site.

## 2014-04-09 NOTE — Progress Notes (Signed)
CBG -130 patient wearing insulin pump; Dale [name] advised.  Patient states he kept pump in place for cath 3 weeks ago. Renal patient, and today is hemodialysis day.  Patient has expiratory wheezes. Dale Malta advised and she will advise Dr [name]. IV fluids at [facility] on pump per order

## 2014-04-09 NOTE — Progress Notes (Signed)
Patient gave 6 units Insulin in pump.  CBG was 237.

## 2014-04-09 NOTE — CV Procedure (Signed)
     Cardiac Catheterization Operative Report  Dale [name] [id number] 12/16/201510:33 AM MATTINGLY,MICHAEL T, MD  Procedure Performed:  1. Rotablator atherectomy mid RCA and distal RCA 2. PTCA/DES x 1 mid LAD 3. PTCA with balloon angioplasty only distal RCA   4. Angioseal left femoral artery  Operator: Lauree Chandler, MD  Access: Left femoral artery, left femoral vein.   Indication:     Unstable angina. Diagnostic cath 03/19/14 with severe stenosis mid RCA. Due to heavy calcification, the vessel could not be stented. Staged PCI today with planned rotablator atherectomy of the RCA.                                  Procedure Details: The risks, benefits, complications, treatment options, and expected outcomes were discussed with the patient. The patient and/or family concurred with the proposed plan, giving informed consent. The patient was brought to the cath lab after IV hydration was begun and oral premedication was given. The patient was further sedated with Versed and Fentanyl. The left groin was prepped and draped in the usual manner. Using the modified Seldinger access technique, a 7 French sheath was placed in the left femoral artery. A 6 French sheath was placed in the left femoral vein. A temporary transvenous pacemaker was passed from the left femoral vein into the RV. A JR4 guiding catheter was used to engage the RCA. I could not pass the rotafloppy wire into the distal vessel. A long Cougar wire was advanced down the RCA and I then used a 1.5 mm OTW balloon to exchange for the rotafloppy wire. I then used a 1.5 mm Rotablator burr to make 3 passes in the mid and distal RCA. A 1.75 mm Rotablator burr was then passed down the RCA 3 times. A 2.5 x 15 mm balloon was used to pre-dilate the mid and distal stenosis. I had treated the distal stenosis with the rotablator burr and the balloon and the final result was favorable. Due to the severe bend in the heavily calcified mid segment,  I could not pass a stent into the distal vessel. I then advanced a Guideliner through the guiding catheter for better support. The mid segment was stented with a 3.5 x 23 mm Xience DES. The stent was post-dilated with a 3.75 x 12 mm Lesage balloon x 2. The stenosis was taken from 99% down to 0%. The distal segment had residual 30% stenosis with no evidence of dissection. There was excellent flow down the vessel.   There were no immediate complications. The patient was taken to the recovery area in stable condition.   Hemodynamic Findings: Central aortic pressure: 181/94  Impression: 1. Unstable angina secondary to severe stenosis mid RCA 2. Successful rotablator atherectomy/PTCA/DES mid RCA  Recommendations: Will continue ASA and Effient. Discharge in am if stable. Dialysis today.        Complications:  None; patient tolerated the procedure well.

## 2014-04-10 DIAGNOSIS — I2510 Atherosclerotic heart disease of native coronary artery without angina pectoris: Secondary | ICD-10-CM

## 2014-04-10 DIAGNOSIS — I5032 Chronic diastolic (congestive) heart failure: Secondary | ICD-10-CM

## 2014-04-10 DIAGNOSIS — Z992 Dependence on renal dialysis: Secondary | ICD-10-CM | POA: Diagnosis not present

## 2014-04-10 DIAGNOSIS — I10 Essential (primary) hypertension: Secondary | ICD-10-CM

## 2014-04-10 DIAGNOSIS — M79609 Pain in unspecified limb: Secondary | ICD-10-CM

## 2014-04-10 DIAGNOSIS — I25110 Atherosclerotic heart disease of native coronary artery with unstable angina pectoris: Secondary | ICD-10-CM | POA: Diagnosis not present

## 2014-04-10 DIAGNOSIS — N186 End stage renal disease: Secondary | ICD-10-CM | POA: Diagnosis not present

## 2014-04-10 DIAGNOSIS — I2584 Coronary atherosclerosis due to calcified coronary lesion: Secondary | ICD-10-CM | POA: Diagnosis not present

## 2014-04-10 LAB — BASIC METABOLIC PANEL
Anion gap: 16 — ABNORMAL HIGH (ref 5–15)
BUN: 45 mg/dL — ABNORMAL HIGH (ref 6–23)
CO2: 26 mEq/L (ref 19–32)
Calcium: 8.5 mg/dL (ref 8.4–10.5)
Chloride: 97 mEq/L (ref 96–112)
Creatinine, Ser: 4.58 mg/dL — ABNORMAL HIGH (ref 0.50–1.35)
GFR calc Af Amer: 16 mL/min — ABNORMAL LOW (ref >90)
GFR calc non Af Amer: 14 mL/min — ABNORMAL LOW (ref >90)
Glucose, Bld: 288 mg/dL — ABNORMAL HIGH (ref 70–99)
Potassium: 4.4 mEq/L (ref 3.7–5.3)
Sodium: 139 mEq/L (ref 137–147)

## 2014-04-10 LAB — GLUCOSE, CAPILLARY
Glucose-Capillary: 284 mg/dL — ABNORMAL HIGH (ref 70–99)
Glucose-Capillary: 194 mg/dL — ABNORMAL HIGH (ref 70–99)

## 2014-04-10 LAB — CBC
HCT: 30 % — ABNORMAL LOW (ref 39.0–52.0)
HCT: 31.8 % — ABNORMAL LOW (ref 39.0–52.0)
Hemoglobin: 9.5 g/dL — ABNORMAL LOW (ref 13.0–17.0)
Hemoglobin: 9.9 g/dL — ABNORMAL LOW (ref 13.0–17.0)
MCH: 31.5 pg (ref 26.0–34.0)
MCH: 31.3 pg (ref 26.0–34.0)
MCHC: 31.7 g/dL (ref 30.0–36.0)
MCHC: 31.1 g/dL (ref 30.0–36.0)
MCV: 99.3 fL (ref 78.0–100.0)
MCV: 100.6 fL — ABNORMAL HIGH (ref 78.0–100.0)
Platelets: 233 10*3/uL (ref 150–400)
Platelets: 244 10*3/uL (ref 150–400)
RBC: 3.02 MIL/uL — ABNORMAL LOW (ref 4.22–5.81)
RBC: 3.16 MIL/uL — ABNORMAL LOW (ref 4.22–5.81)
RDW: 15.5 % (ref 11.5–15.5)
RDW: 15.6 % — ABNORMAL HIGH (ref 11.5–15.5)
WBC: 11.1 10*3/uL — ABNORMAL HIGH (ref 4.0–10.5)
WBC: 11.9 10*3/uL — ABNORMAL HIGH (ref 4.0–10.5)

## 2014-04-10 MED ORDER — FAMOTIDINE 20 MG PO TABS
20.0000 mg | ORAL_TABLET | Freq: Every day | ORAL | Status: DC
  Administered 2014-04-10 – 2014-04-11 (×2): 20 mg via ORAL
  Filled 2014-04-10 (×3): qty 1

## 2014-04-10 MED ORDER — DARBEPOETIN ALFA 40 MCG/0.4ML IJ SOSY
40.0000 ug | PREFILLED_SYRINGE | INTRAMUSCULAR | Status: DC
  Filled 2014-04-10: qty 0.4

## 2014-04-10 MED ORDER — DIPHENHYDRAMINE HCL 50 MG/ML IJ SOLN
25.0000 mg | INTRAMUSCULAR | Status: DC | PRN
Start: 2014-04-10 — End: 2014-04-11
  Administered 2014-04-10 – 2014-04-11 (×6): 50 mg via INTRAVENOUS
  Filled 2014-04-10 (×5): qty 1

## 2014-04-10 MED ORDER — HYDROMORPHONE HCL 1 MG/ML IJ SOLN
1.0000 mg | INTRAMUSCULAR | Status: DC | PRN
  Administered 2014-04-10 – 2014-04-11 (×10): 1 mg via INTRAVENOUS
  Filled 2014-04-10 (×10): qty 1

## 2014-04-10 MED ORDER — DIPHENHYDRAMINE HCL 50 MG/ML IJ SOLN
25.0000 mg | Freq: Once | INTRAMUSCULAR | Status: AC
  Administered 2014-04-10: 07:00:00 25 mg via INTRAVENOUS
  Filled 2014-04-10: qty 1

## 2014-04-10 MED ORDER — DOXERCALCIFEROL 4 MCG/2ML IV SOLN
2.0000 ug | INTRAVENOUS | Status: DC
  Filled 2014-04-10: qty 2

## 2014-04-10 MED ORDER — INSULIN PUMP
Freq: Three times a day (TID) | SUBCUTANEOUS | Status: DC
  Administered 2014-04-09 (×2): via SUBCUTANEOUS
  Administered 2014-04-10: 6.5 via SUBCUTANEOUS
  Administered 2014-04-10: 08:00:00 via SUBCUTANEOUS
  Administered 2014-04-10: 5 via SUBCUTANEOUS
  Administered 2014-04-10 (×3): via SUBCUTANEOUS
  Administered 2014-04-11: 13.25 via SUBCUTANEOUS
  Filled 2014-04-10: qty 1

## 2014-04-10 MED FILL — Sodium Chloride IV Soln 0.9%: INTRAVENOUS | Qty: 50 | Status: AC

## 2014-04-10 NOTE — Progress Notes (Signed)
VASCULAR LAB PRELIMINARY  PRELIMINARY  PRELIMINARY  PRELIMINARY  Lt groin evaluation for possible pseudoaneurysm completed.    Preliminary report:  There is no obvious evidence of a pseudoaneurysm.There is a small hematoma noted.  There appears to be a small A-V fistula.  Kellan Boehlke, RVS 04/10/2014, 1:38 PM

## 2014-04-10 NOTE — Progress Notes (Signed)
Report given to Kizzie Ide RN and patient transferred via wheelchair with staff to [place].

## 2014-04-10 NOTE — Progress Notes (Signed)
Inpatient Diabetes Program Recommendations  AACE/ADA: New Consensus Statement on Inpatient Glycemic Control (2013)  Target Ranges:  Prepandial:   less than 140 mg/dL      Peak postprandial:   less than 180 mg/dL (1-2 hours)      Critically ill patients:  140 - 180 mg/dL   Results for TORBEN, KRAWIEC (MRN [id number]) as of 04/10/2014 10:20  Ref. Range 04/09/2014 06:23 04/09/2014 11:28 04/09/2014 18:51 04/09/2014 20:57 04/10/2014 06:56  Glucose-Capillary Latest Range: 70-99 mg/dL 130 (H) 237 (H) 217 (H) 233 (H) 284 (H)    Reason for Visit: Pump assessment  Diabetes history: Type 1 Outpatient Diabetes medications: Novolog via On Touch Ping insulin pump Current orders for Inpatient glycemic control:  Patient is managing insulin pump by himself. Pump order set in place and staff documenting doses.  Current pump settings; Correction factor  12am- 4pm= 1:20         4pm-12am= 1:30 Target blood sugar= 120mg /dl Insulin to carb ratio 12am-4pm= 1:8                                  4pm-12am=1:10  Basal rates   12am-3am  = 1.50units/hour                        3am-8am    =1.75units/hour                       8am-12pm   =1.45units/hour                       12pm-4pm   =1.625units/hour                         4pm-12am= 1.50units/hour  Gentry Fitz, RN, [place], [place], CDE Diabetes Coordinator Inpatient Diabetes Program  [phone] (Team Pager) [phone] [facility] Office) 04/10/2014 10:28 AM

## 2014-04-10 NOTE — Progress Notes (Signed)
Patient Name: Dale [name] Date of Encounter: 04/10/2014     Active Problems:   Unstable angina   CAD (coronary artery disease)    SUBJECTIVE  Very itchy this morning. Denies any CP or SOB. Significant L groin pain s/p dilaudid. Also had 7-8 back pain however has chronic back pain.   CURRENT MEDS . allopurinol  200 mg Oral Daily  . aspirin  81 mg Oral Daily  . atorvastatin  10 mg Oral QPM  . calcium acetate  2,001 mg Oral TID WC  . diphenhydrAMINE      . docusate sodium  200 mg Oral TID  . furosemide  160 mg Oral BID  . HYDROmorphone      . isosorbide mononitrate  30 mg Oral Daily  . levothyroxine  100 mcg Oral QAC breakfast  . loratadine  10 mg Oral Daily  . metoCLOPramide  5 mg Oral TID PC & HS  . metoprolol succinate  12.5 mg Oral Daily  . prasugrel  10 mg Oral Daily  . predniSONE  40 mg Oral Q breakfast  . rOPINIRole  2 mg Oral Q supper    OBJECTIVE  Filed Vitals:   04/09/14 2120 04/09/14 2232 04/10/14 0012 04/10/14 0604  BP: 141/68 146/68 133/56 193/85  Pulse: 82 90 86 78  Temp: 97.1 F (36.2 C) 97.8 F (36.6 C) 97.9 F (36.6 C) 98 F (36.7 C)  TempSrc: Oral Oral Oral Oral  Resp: 16 16 16 13   Height:      Weight: 277 lb 12.5 oz (126 kg)  277 lb 12.5 oz (126 kg)   SpO2: 99% 92% 93% 95%    Intake/Output Summary (Last 24 hours) at 04/10/14 0648 Last data filed at 04/09/14 2226  Gross per 24 hour  Intake    480 ml  Output   3100 ml  Net  -2620 ml   Filed Weights   04/09/14 1729 04/09/14 2120 04/10/14 0012  Weight: 276 lb 3.8 oz (125.3 kg) 277 lb 12.5 oz (126 kg) 277 lb 12.5 oz (126 kg)    PHYSICAL EXAM  General: Pleasant, NAD.  Neuro: Alert and oriented X 3. Moves all extremities spontaneously. Psych: Normal affect. HEENT:  Normal  Neck: Supple without bruits or JVD. Lungs:  Resp regular and unlabored, CTA. Heart: RRR no s3, s4, or murmurs. Abdomen: Soft, non-tender, non-distended, BS + x 4.  Extremities: No clubbing, cyanosis or edema. L  BKA noted, R groin AVF noted, L groin cath site stable, no active bleeding, however significant ecchymosis extend upto LLQ  Accessory Clinical Findings  CBC  Recent Labs  04/09/14 1800 04/10/14 0342  WBC 9.0 11.1*  HGB 10.0* 9.5*  HCT 31.8* 30.0*  MCV 99.1 99.3  PLT 228 0000000   Basic Metabolic Panel  Recent Labs  04/09/14 1800 04/10/14 0342  NA 135* 139  K 5.5* 4.4  CL 97 97  CO2 20 26  GLUCOSE 279* 288*  BUN 79* 45*  CREATININE 6.00* 4.58*  CALCIUM 8.9 8.5  PHOS 6.2*  --    Liver Function Tests  Recent Labs  04/09/14 1800  ALBUMIN 3.1*    TELE NSR with HR 70s    ECG  NSR with HR 70s  Echocardiogram 02/25/2014  LV EF: 60% -  65%  ------------------------------------------------------------------- Indications:   Dyspena on exertion (R06.09).  ------------------------------------------------------------------- History:  PMH:  Congestive heart failure. Risk factors: Pericardial effusion. OSA> Acute respiratory failure. Constipation. Colitis. Gastroenteritis. ESRD. Acute kidney injury. H/o kidney  transplant. S/p below knee amputation. Anemia. Diarrhea. Nausea. Leukocytosis. Septic shock. Metabolic acidosis. Salmonella bacteremia. Hypertension. Diabetes mellitus.  ------------------------------------------------------------------- Study Conclusions  - Left ventricle: The cavity size was normal. Wall thickness was increased in a pattern of mild LVH. Systolic function was normal. The estimated ejection fraction was in the range of 60% to 65%. Wall motion was normal; there were no regional wall motion abnormalities. Doppler parameters are consistent with abnormal left ventricular relaxation (grade 1 diastolic dysfunction). - Aortic valve: There was no stenosis. - Mitral valve: Mildly calcified annulus. There was trivial regurgitation. - Left atrium: The atrium was mildly dilated. - Right ventricle: The cavity size was normal.  Systolic function was normal. - Right atrium: The atrium was mildly dilated. - Pulmonary arteries: No complete TR doppler jet so unable to estimate PA systolic pressure. - Systemic veins: IVC measured 2.3 cm with > 50% respirophasic variation, suggesting RA pressure 8 mmHg.  Impressions:  - Normal LV size with mild LV hypertrophy. EF 60-65%. Normal RV size and systolic function. No significant valvular abnormalities.    Radiology/Studies  No results found.  ASSESSMENT AND PLAN  1. CAD   - abnormal Myoview 03/05/2014   - Cath 03/19/2014 severe RCA stenosis s/p POBA, planned for staged rotablator atherectomy of RCA  - staged cath 04/09/2014 rotablator atherectomy and 3.5x23 mm Xience DES to mid RCA  - continue ASA and effient   - significant pain in the L groin with surrounding ecchymosis extend up to LLQ, will check with Dr. Tamala Julian whether need to obtain CT of abdomen and pelvis to r/o bleeding (hgb stable, down to 9.5 from 10.0)   2. HTN 3. ESRD on HD M/W/F: dialyzed yesterday 4. Chronic diastolic HF 5. DM 6. IV dye allergy/rash: treated with prednisone (unclear if allergy to dye or plavix, plavix changed to effient)  Signed, Almyra Deforest PA-C Pager: [phone]

## 2014-04-10 NOTE — Progress Notes (Signed)
  [facility] Progress Note   Subjective: itching a lot , +rash , hx contrast allergy  Filed Vitals:   04/09/14 2232 04/10/14 0012 04/10/14 0604 04/10/14 0820  BP: 146/68 133/56 193/85 163/66  Pulse: 90 86 78 80  Temp: 97.8 F (36.6 C) 97.9 F (36.6 C) 98 F (36.7 C) 98 F (36.7 C)  TempSrc: Oral Oral Oral Oral  Resp: 16 16 13 20   Height:      Weight:  126 kg (277 lb 12.5 oz)    SpO2: 92% 93% 95% 96%   Exam: Pleasant WM no distress Diffuse faint macular rash over torso, arms No JVD Chest clear bilat RRR no MRG Abd obese, soft, NTND, no mass or HSM L AKA intact, RLE atrophic skin changes, no sig edema Neuro is Ox 3, nF RUA AVF patent   HD: MWF [facility] 3h 60min 450/600 123.5kg 4K/2.25 Bath Profile 4 R arm AVF Heparin 9000 Hectorol 2 ug TIW pth 173, 5, corr Ca 7 (usually 9's)  Assessment: 1. CAD s/p complex PCI to RCA to 2 lesions w stent x 1 2. Contrast allergy reaction - hx of prior, was premedicated 3. ESRD on HD 4. Hx failed renal Tx 5. PVD with L AKA 6. DM type 1 7. HTN/ vol - up 1-2 kg, stable vol status 8. Anemia not on esa, Hb 11.2 9. MBD cont vit D  Plan- IV benadryl ordered pt request, HD if still here tomorrow    Kelly Splinter MD  pager [phone]    cell [phone]  04/10/2014, 10:17 AM     Recent Labs Lab 04/03/14 1710 04/09/14 1800 04/10/14 0342  NA 137 135* 139  K 4.6 5.5* 4.4  CL 102 97 97  CO2 29 20 26   GLUCOSE 135* 279* 288*  BUN 42* 79* 45*  CREATININE 4.9* 6.00* 4.58*  CALCIUM 8.8 8.9 8.5  PHOS  --  6.2*  --     Recent Labs Lab 04/09/14 1800  ALBUMIN 3.1*    Recent Labs Lab 04/03/14 1710 04/09/14 1800 04/10/14 0342  WBC 9.5 9.0 11.1*  NEUTROABS 6.0  --   --   HGB 11.2* 10.0* 9.5*  HCT 35.6* 31.8* 30.0*  MCV 100.8* 99.1 99.3  PLT 246.0 228 233   . allopurinol  200 mg Oral Daily  . aspirin  81 mg Oral Daily  . atorvastatin  10 mg Oral QPM  . calcium acetate  2,001 mg Oral TID WC   . docusate sodium  200 mg Oral TID  . furosemide  160 mg Oral BID  . insulin pump   Subcutaneous TID AC, HS, 0200  . isosorbide mononitrate  30 mg Oral Daily  . levothyroxine  100 mcg Oral QAC breakfast  . loratadine  10 mg Oral Daily  . metoCLOPramide  5 mg Oral TID PC & HS  . metoprolol succinate  12.5 mg Oral Daily  . prasugrel  10 mg Oral Daily  . predniSONE  40 mg Oral Q breakfast  . rOPINIRole  2 mg Oral Q supper     acetaminophen, diphenhydrAMINE, diphenhydrAMINE, HYDROmorphone (DILAUDID) injection, nitroGLYCERIN, ondansetron (ZOFRAN) IV

## 2014-04-10 NOTE — Progress Notes (Signed)
Continue to have groin pain improved on dilaudid, U/S result noted, no pseudoaneurysm, but has what appears to be small AVF. Patient denies ever had AVF placement in L groin. He continues to have significant generalized pruiritis although no obvious rash unlike last time. Currently on prednisone and benadryl, will add pepcid, observe overnight and expect discharge tomorrow AM.   Signed, Almyra Deforest PA Pager: [phone]

## 2014-04-10 NOTE — Progress Notes (Signed)
0920-1000 Cardiac Rehab Noted that pt is having pain in left groin and is going for scan, ambulation held. Completed stent discharge education with pt. He voices understanding. Pt declines Outpt. CRP due to dialysis the same days that he would go to the program. He has a membership at the [facility] and wants to  exercise on his own. I encouraged diet compliance. Discussed with his nurse, that if his scan is negative that she will ambulate him in the hall. Deon Pilling, RN 04/10/2014 10:09 AM

## 2014-04-11 ENCOUNTER — Other Ambulatory Visit: Payer: Self-pay | Admitting: Physician Assistant

## 2014-04-11 ENCOUNTER — Telehealth: Payer: Self-pay | Admitting: Physician Assistant

## 2014-04-11 DIAGNOSIS — I25110 Atherosclerotic heart disease of native coronary artery with unstable angina pectoris: Secondary | ICD-10-CM

## 2014-04-11 DIAGNOSIS — I770 Arteriovenous fistula, acquired: Secondary | ICD-10-CM

## 2014-04-11 DIAGNOSIS — I200 Unstable angina: Secondary | ICD-10-CM

## 2014-04-11 LAB — CBC
HCT: 29 % — ABNORMAL LOW (ref 39.0–52.0)
Hemoglobin: 9.2 g/dL — ABNORMAL LOW (ref 13.0–17.0)
MCH: 31.4 pg (ref 26.0–34.0)
MCHC: 31.7 g/dL (ref 30.0–36.0)
MCV: 99 fL (ref 78.0–100.0)
Platelets: 199 10*3/uL (ref 150–400)
RBC: 2.93 MIL/uL — ABNORMAL LOW (ref 4.22–5.81)
RDW: 15.4 % (ref 11.5–15.5)
WBC: 12.4 10*3/uL — ABNORMAL HIGH (ref 4.0–10.5)

## 2014-04-11 LAB — RENAL FUNCTION PANEL
Albumin: 3.1 g/dL — ABNORMAL LOW (ref 3.5–5.2)
Anion gap: 18 — ABNORMAL HIGH (ref 5–15)
BUN: 77 mg/dL — ABNORMAL HIGH (ref 6–23)
CO2: 22 mEq/L (ref 19–32)
Calcium: 8.9 mg/dL (ref 8.4–10.5)
Chloride: 92 mEq/L — ABNORMAL LOW (ref 96–112)
Creatinine, Ser: 6.67 mg/dL — ABNORMAL HIGH (ref 0.50–1.35)
GFR calc Af Amer: 10 mL/min — ABNORMAL LOW (ref >90)
GFR calc non Af Amer: 9 mL/min — ABNORMAL LOW (ref >90)
Glucose, Bld: 266 mg/dL — ABNORMAL HIGH (ref 70–99)
Phosphorus: 7.8 mg/dL — ABNORMAL HIGH (ref 2.3–4.6)
Potassium: 5.1 mEq/L (ref 3.7–5.3)
Sodium: 132 mEq/L — ABNORMAL LOW (ref 137–147)

## 2014-04-11 LAB — GLUCOSE, CAPILLARY
Glucose-Capillary: 212 mg/dL — ABNORMAL HIGH (ref 70–99)
Glucose-Capillary: 235 mg/dL — ABNORMAL HIGH (ref 70–99)

## 2014-04-11 MED ORDER — PREDNISONE 20 MG PO TABS: 40.0000 mg | ORAL_TABLET | Freq: Every day | ORAL | Status: DC

## 2014-04-11 MED ORDER — HYDROMORPHONE HCL 1 MG/ML IJ SOLN
INTRAMUSCULAR | Status: AC
  Filled 2014-04-11: qty 1

## 2014-04-11 MED ORDER — DOXERCALCIFEROL 4 MCG/2ML IV SOLN
INTRAVENOUS | Status: AC
  Administered 2014-04-11: 2 ug
  Filled 2014-04-11: qty 2

## 2014-04-11 MED ORDER — LIDOCAINE-PRILOCAINE 2.5-2.5 % EX CREA: 1.0000 "application " | TOPICAL_CREAM | CUTANEOUS | Status: DC | PRN

## 2014-04-11 MED ORDER — RANITIDINE HCL 150 MG PO TABS: 150.0000 mg | ORAL_TABLET | Freq: Two times a day (BID) | ORAL | Status: DC

## 2014-04-11 MED ORDER — LIDOCAINE HCL (PF) 1 % IJ SOLN: 5.0000 mL | INTRAMUSCULAR | Status: DC | PRN

## 2014-04-11 MED ORDER — NEPRO/CARBSTEADY PO LIQD
237.0000 mL | ORAL | Status: DC | PRN
  Filled 2014-04-11: qty 237

## 2014-04-11 MED ORDER — FAMOTIDINE 20 MG PO TABS: 20.0000 mg | ORAL_TABLET | Freq: Every day | ORAL | Status: DC

## 2014-04-11 MED ORDER — FUROSEMIDE 80 MG PO TABS
160.0000 mg | ORAL_TABLET | Freq: Two times a day (BID) | ORAL | Status: DC
Start: 2014-04-11 — End: 2014-10-03

## 2014-04-11 MED ORDER — SODIUM CHLORIDE 0.9 % IV SOLN: 100.0000 mL | INTRAVENOUS | Status: DC | PRN

## 2014-04-11 MED ORDER — DIPHENHYDRAMINE HCL 25 MG PO CAPS: 25.0000 mg | ORAL_CAPSULE | Freq: Four times a day (QID) | ORAL | Status: DC | PRN

## 2014-04-11 MED ORDER — HEPARIN SODIUM (PORCINE) 1000 UNIT/ML DIALYSIS
1000.0000 [IU] | INTRAMUSCULAR | Status: DC | PRN
  Filled 2014-04-11: qty 1

## 2014-04-11 MED ORDER — ALTEPLASE 2 MG IJ SOLR
2.0000 mg | Freq: Once | INTRAMUSCULAR | Status: DC | PRN
  Filled 2014-04-11: qty 2

## 2014-04-11 MED ORDER — HEPARIN SODIUM (PORCINE) 1000 UNIT/ML DIALYSIS
3000.0000 [IU] | INTRAMUSCULAR | Status: DC | PRN
  Filled 2014-04-11: qty 3

## 2014-04-11 MED ORDER — PENTAFLUOROPROP-TETRAFLUOROETH EX AERO: 1.0000 "application " | INHALATION_SPRAY | CUTANEOUS | Status: DC | PRN

## 2014-04-11 MED ORDER — DIPHENHYDRAMINE HCL 50 MG/ML IJ SOLN
INTRAMUSCULAR | Status: AC
  Filled 2014-04-11: qty 1

## 2014-04-11 MED ORDER — DARBEPOETIN ALFA 40 MCG/0.4ML IJ SOSY
PREFILLED_SYRINGE | INTRAMUSCULAR | Status: AC
  Administered 2014-04-11: 40 ug
  Filled 2014-04-11: qty 0.4

## 2014-04-11 NOTE — Progress Notes (Signed)
Late Entry: Patient discharged per orders. All d/c orders, instructions, follow up appts/follow up care, medications discussed with patient. Time allowed for questions and concerns. Patient stated he had none. Patient transported home by his wife. Patient left the unit via wheelchair with Nurse Tech.  Roselyn Reef Tita Terhaar,RN

## 2014-04-11 NOTE — Progress Notes (Signed)
Subjective:  Seen on dialysis, mild left groin pain, intermittent itching, but probable discharge today  Objective: Vital signs in last 24 hours: Temp:  [97.9 F (36.6 C)-98.3 F (36.8 C)] 98 F (36.7 C) (12/18 0625) Pulse Rate:  [76-92] 92 (12/18 0625) Resp:  [18-20] 20 (12/18 0625) BP: (151-180)/(66-84) 180/74 mmHg (12/18 0625) SpO2:  [96 %-100 %] 100 % (12/18 0625) Weight:  [129.2 kg (284 lb 13.4 oz)-131.135 kg (289 lb 1.6 oz)] 129.2 kg (284 lb 13.4 oz) (12/18 0625) Weight change: 5.835 kg (12 lb 13.8 oz)  Intake/Output from previous day: 12/17 0701 - 12/18 0700 In: 240 [P.O.:240] Out: -  Intake/Output this shift:   Lab Results:  Recent Labs  04/10/14 0342 04/10/14 1050  WBC 11.1* 11.9*  HGB 9.5* 9.9*  HCT 30.0* 31.8*  PLT 233 244   BMET:  Recent Labs  04/09/14 1800 04/10/14 0342  NA 135* 139  K 5.5* 4.4  CL 97 97  CO2 20 26  GLUCOSE 279* 288*  BUN 79* 45*  CREATININE 6.00* 4.58*  CALCIUM 8.9 8.5  ALBUMIN 3.1*  --    No results for input(s): PTH in the last 72 hours. Iron Studies: No results for input(s): IRON, TIBC, TRANSFERRIN, FERRITIN in the last 72 hours.  Studies/Results: No results found.  EXAM: General appearance:  Alert, in no apparent distress Resp:  CTA without rales, rhonchi, or wheezes Cardio:  RRR without murmur or rub GI:  Obese, + BS, soft and nontender Extremities:  L AKA, bruising @ L groin area, trace edema Access:  AVF @ RUA with BFR 450  HD: MWF [facility] 3h 69min 450/600 123.5kg 4K/2.25 Bath Profile 4 R arm AVF Heparin 9000 Hectorol 2 ug TIW pth 173, 5, corr Ca 7 (usually 9's)  Assessment/Plan: 1. CAD - s/p complex PTCA/DES & PTCA to 2 lesions on RCA 12/16. 2. Contrast allergy reaction - Hx of extensive reactions; premedicated, now on Benadryl, Pepsid, & Prednisone. 3. ESRD - HD on MWF @ [facility], K 4.4  HD today. 4. HTN/Volume - BP 180/74 on Metoprolol 12.5 mg qd; wt 129.2 kg, UF goal 5 L today. 5. Anemia - Hgb  9.9, Aranesp 40 mcg today. 6. Sec HPT - ca 8.5 (9.2 corrected), P 6.2; Hectorol 2 mcg, Phoslo 3 with meals. 7. DM Type 1 - per primary. 8. PVD - s/p L AKA 9. Hx failed renal transplant   LOS: 2 days   LYLES,CHARLES 04/11/2014,6:58 AM  Pt seen, examined and agree w A/P as above.  Kelly Splinter MD pager [phone]    cell [phone] 04/11/2014, 11:38 AM

## 2014-04-11 NOTE — Telephone Encounter (Signed)
    Patient discharged from hospital today after a cath. Catheter placed in left femoral artery. Post-procedure, he had significant groin pain and was noted to have a hematoma. An ultrasound did not show a pseudoaneurysm, it did show a small hematoma and small AVM. Now he calls after hours because he has been having some bruising in his left testicle and it is slightly larger. No pain at all. I spoke with Dr Bronson Ing about this who recommended that he ice it and if it becomes much larger or becomes painful than he shuold come in and be seen.     [name] PA-C  [facility]

## 2014-04-11 NOTE — Discharge Summary (Signed)
CARDIOLOGY DISCHARGE SUMMARY   Patient ID: Dale [name] MRN: [id number] DOB/AGE: 1965/11/21 48 y.o.  Admit date: 04/09/2014 Discharge date: 04/11/2014  PCP: Windy Kalata, MD Primary Cardiologist: Dr. [name]  Primary Discharge Diagnosis:  Unstable anginal pain, s/p PTCA distal RCA and DES mid LAD Secondary Discharge Diagnosis:  ESRD on HD Allergy to IV dye Left groin AV fistula, small Anemia, chronic disease  Consults: Nephrology  Procedure Performed:  1. Rotablator atherectomy mid RCA and distal RCA 2. PTCA/DES x 1 mid RCA 3. PTCA with balloon angioplasty only distal RCA  4. Angioseal left femoral artery  Hospital Course: Dale [name] is a 48 y.o. male with a history of diabetes, HTN, sleep apnea, ESRD, fibromyalgia, chronic anemia, status post left BKA and renal transplant had been off dialysis because of his transplanted kidney. However, he developed salmonella bacteremia in 2014, his renal function deteriorated and dialysis was resumed.   He was evaluated for dyspnea with exertion in November 2015.  A Myoview was performed but was abnormal. He was scheduled for cardiac catheterization and pretreated for a dye allergy. PR catheterization was performed on 11/25. He had balloon angioplasty to the mid RCA but no stent was used because of severe diffuse calcification. He was felt to need Rotablator atherectomy in 2-3 weeks.  He was noted to have prolonged itching after a dye procedure, treated with a Medrol Dosepak. He was seen in the office 12/10 and was still itching, so was started on Deltasone 40 mg daily. He began improving so was scheduled for cath. He came to the hospital for the PCI on 12/16.   He had the procedures described above, procedure results below. Initially, he tolerated the procedure well. However, post-procedure, he had significant groin pain and was noted to have a hematoma. He was seen on 1217 and an ultrasound was ordered. Additionally, he was  having significant itching, not controlled by Benadryl.  The ultrasound did not show a pseudoaneurysm, it did show a small hematoma and small AVM. Pain was controlled with oral pain medications.  On 12/18, he was seen by Dr. Ellyn Hack and all data were reviewed. He was seen by the nephrology team and dialyzed. They will monitor his blood sugars carefully on steroids. He is to taper the steroids over time, and continue the Pepcid plus Benadryl for several days as well. It is hoped that his AV fistula will close on its own, and he is to get an ultrasound next week at the office to follow-up on this. No further inpatient workup is indicated and he is considered stable for discharge, to follow up as an outpatient.  BP 144/71 mmHg  Pulse 77  Temp(Src) 98 F (36.7 C) (Oral)  Resp 18  Ht 5\' 8"  (1.727 m)  Wt 274 lb 14.6 oz (124.7 kg)  BMI 41.81 kg/m2  SpO2 100% General: Well developed, well nourished, male in no acute distress Head: Eyes PERRLA, No xanthomas. Normocephalic and atraumatic  Lungs: Few rales bases Heart: HRRR S1 S2, without MRG. Minimal JVD. Left groin with ecchymosis Abdomen: Bowel sounds are present, abdomen soft and non-tender without masses or  hernias noted. Msk: Normal strength and tone for age. Extremities: No clubbing, cyanosis or edema. S/p left BKA  Neuro: Alert and oriented X 3. Skin: some erythema on chest and arms  Labs:   Lab Results  Component Value Date   WBC 12.4* 04/11/2014   HGB 9.2* 04/11/2014   HCT 29.0* 04/11/2014   MCV 99.0  04/11/2014   PLT 199 04/11/2014     Recent Labs Lab 04/11/14 0645  NA 132*  K 5.1  CL 92*  CO2 22  BUN 77*  CREATININE 6.67*  CALCIUM 8.9  GLUCOSE 266*   Cardiac Cath: 04/09/2014 Procedure Details: The risks, benefits, complications, treatment options, and expected outcomes were discussed with the patient. The patient and/or family concurred with the proposed plan, giving informed consent. The patient was brought to the  cath lab after IV hydration was begun and oral premedication was given. The patient was further sedated with Versed and Fentanyl. The left groin was prepped and draped in the usual manner. Using the modified Seldinger access technique, a 7 French sheath was placed in the left femoral artery. A 6 French sheath was placed in the left femoral vein. A temporary transvenous pacemaker was passed from the left femoral vein into the RV. A JR4 guiding catheter was used to engage the RCA. I could not pass the rotafloppy wire into the distal vessel. A long Cougar wire was advanced down the RCA and I then used a 1.5 mm OTW balloon to exchange for the rotafloppy wire. I then used a 1.5 mm Rotablator burr to make 3 passes in the mid and distal RCA. A 1.75 mm Rotablator burr was then passed down the RCA 3 times. A 2.5 x 15 mm balloon was used to pre-dilate the mid and distal stenosis. I had treated the distal stenosis with the rotablator burr and the balloon and the final result was favorable. Due to the severe bend in the heavily calcified mid segment, I could not pass a stent into the distal vessel. I then advanced a Guideliner through the guiding catheter for better support. The mid segment was stented with a 3.5 x 23 mm Xience DES. The stent was post-dilated with a 3.75 x 12 mm Trafford balloon x 2. The stenosis was taken from 99% down to 0%. The distal segment had residual 30% stenosis with no evidence of dissection. There was excellent flow down the vessel.  There were no immediate complications. The patient was taken to the recovery area in stable condition.  Hemodynamic Findings: Central aortic pressure: 181/94 Impression: 1. Unstable angina secondary to severe stenosis mid RCA 2. Successful rotablator atherectomy/PTCA/DES mid RCA Recommendations: Will continue ASA and Effient. Discharge in am if stable  EKG: 04/10/2014 Sinus rhythm, rate 72, no acute ischemic changes  Lower extremity arterial Doppler:  04/10/2014 Conclusions - Positive for a small arteriovenous malformation of the left lower extremity - No evidence of pseudoaneurysm of the left lower extremity.  FOLLOW UP PLANS AND APPOINTMENTS Allergies  Allergen Reactions  . Doxycycline     RASH  . Hydrocodone     RASH  . Morphine And Related Hives and Nausea Only  . Amoxicillin Other (See Comments)    Reaction unknown  . Ciprofloxacin Other (See Comments)    Reaction unknown  . Clindamycin/Lincomycin Other (See Comments)    Reaction unknown  . Codeine Other (See Comments)    Reaction unknown. But pt has tolerated hydrocodone in Vicodin and Norco in the past  . Levofloxacin Other (See Comments)    Reaction unknown  . Vasotec Cough  . Contrast Media [Iodinated Diagnostic Agents] Rash  . Oxycodone Rash  . Plavix [Clopidogrel Bisulfate] Rash     Medication List    STOP taking these medications        methylPREDNISolone 4 MG tablet  Commonly known as:  MEDROL DOSEPAK  TAKE these medications        allopurinol 100 MG tablet  Commonly known as:  ZYLOPRIM  Take 200 mg by mouth daily.     aspirin 81 MG tablet  Take 81 mg by mouth every evening.     atorvastatin 10 MG tablet  Commonly known as:  LIPITOR  Take 10 mg by mouth every evening.     calcium acetate 667 MG capsule  Commonly known as:  PHOSLO  Take 1,334-2,001 mg by mouth See admin instructions. Take 2,001 mg (3 capsules) with meals and 1,334 mg (2 capsules) with snacks     diphenhydrAMINE 25 mg capsule  Commonly known as:  BENADRYL  Take 1-2 capsules (25-50 mg total) by mouth every 6 (six) hours as needed for itching.     docusate sodium 100 MG capsule  Commonly known as:  COLACE  Take 200 mg by mouth 3 (three) times daily.     famotidine 20 MG tablet  Commonly known as:  PEPCID  Take 1 tablet (20 mg total) by mouth daily. Take daily for 5 days, then resume Zantac     furosemide 80 MG tablet  Commonly known as:  LASIX  Take 2 tablets (160 mg  total) by mouth 2 (two) times daily. OK to take if you are still urinating and it is helpful.     GLUCAGON EMERGENCY 1 MG injection  Generic drug:  glucagon  Inject 1 mg into the muscle daily as needed (low blood sugar).     HYDROmorphone 2 MG tablet  Commonly known as:  DILAUDID  Take 2 mg by mouth 3 (three) times daily as needed for severe pain.     hydrOXYzine 25 MG capsule  Commonly known as:  VISTARIL  Take 25 mg by mouth 3 (three) times daily as needed for itching.     insulin pump Soln  Inject into the skin continuous. Humalog     isosorbide mononitrate 30 MG 24 hr tablet  Commonly known as:  IMDUR  Take 1 tablet (30 mg total) by mouth daily.     levothyroxine 100 MCG tablet  Commonly known as:  SYNTHROID, LEVOTHROID  Take 100 mcg by mouth daily before breakfast.     loratadine 10 MG tablet  Commonly known as:  CLARITIN  Take 10 mg by mouth daily.     metoCLOPramide 5 MG tablet  Commonly known as:  REGLAN  Take 5 mg by mouth 4 (four) times daily - after meals and at bedtime.     metoprolol succinate 25 MG 24 hr tablet  Commonly known as:  TOPROL-XL  Take 0.5 tablets (12.5 mg total) by mouth daily.     nitroGLYCERIN 0.4 MG SL tablet  Commonly known as:  NITROSTAT  Place 0.4 mg under the tongue every 5 (five) minutes as needed for chest pain.     prasugrel 10 MG Tabs tablet  Commonly known as:  EFFIENT  Take 1 tablet (10 mg total) by mouth daily.     predniSONE 20 MG tablet  Commonly known as:  DELTASONE  Take 2 tablets (40 mg total) by mouth daily with breakfast. 2 tabs daily x 4 days, 1 tab daily x 4 days, 1/2 tab daily x 4 days and stop     promethazine 25 MG tablet  Commonly known as:  PHENERGAN  Take 25 mg by mouth Every 6 hours as needed for nausea or vomiting. For nausea     ranitidine 150 MG tablet  Commonly known as:  ZANTAC  Take 1 tablet (150 mg total) by mouth 2 (two) times daily. HOLD for 5 days while on Pepcid (famotidine).     rOPINIRole 2  MG tablet  Commonly known as:  REQUIP  Take 2 mg by mouth daily with supper.        Discharge Instructions    Diet - low sodium heart healthy    Complete by:  As directed      Diet Carb Modified    Complete by:  As directed      Increase activity slowly    Complete by:  As directed           Follow-up Information    Follow up with Lauree Chandler, MD.   Specialty:  Cardiology   Why:  The office will call.   Contact information:   [place] [address] [address] [phone]       BRING ALL MEDICATIONS WITH YOU TO FOLLOW UP APPOINTMENTS  Time spent with patient to include physician time: 45 min Signed: Rosaria Ferries, PA-C 04/11/2014, 1:09 PM Co-Sign MD  Pt. Seen & examined today. See final PN for recommendations.  Leonie Man, M.D., M.S. Interventional Cardiologist   Pager # [phone]

## 2014-04-14 ENCOUNTER — Other Ambulatory Visit (HOSPITAL_COMMUNITY): Payer: Self-pay | Admitting: Cardiology

## 2014-04-14 DIAGNOSIS — Q2730 Arteriovenous malformation, site unspecified: Secondary | ICD-10-CM

## 2014-04-15 ENCOUNTER — Ambulatory Visit (HOSPITAL_COMMUNITY): Payer: Medicare Other | Attending: Cardiology | Admitting: Cardiology

## 2014-04-15 DIAGNOSIS — I770 Arteriovenous fistula, acquired: Secondary | ICD-10-CM

## 2014-04-15 DIAGNOSIS — I2510 Atherosclerotic heart disease of native coronary artery without angina pectoris: Secondary | ICD-10-CM | POA: Diagnosis not present

## 2014-04-15 DIAGNOSIS — Q2730 Arteriovenous malformation, site unspecified: Secondary | ICD-10-CM | POA: Insufficient documentation

## 2014-04-15 DIAGNOSIS — E119 Type 2 diabetes mellitus without complications: Secondary | ICD-10-CM | POA: Insufficient documentation

## 2014-04-15 DIAGNOSIS — I10 Essential (primary) hypertension: Secondary | ICD-10-CM | POA: Insufficient documentation

## 2014-04-15 DIAGNOSIS — R1032 Left lower quadrant pain: Secondary | ICD-10-CM

## 2014-04-15 NOTE — Progress Notes (Signed)
Lower arterial duplex left groin performed

## 2014-04-16 ENCOUNTER — Telehealth: Payer: Self-pay | Admitting: Cardiovascular Disease

## 2014-04-16 ENCOUNTER — Telehealth (HOSPITAL_COMMUNITY): Payer: Self-pay | Admitting: Cardiology

## 2014-04-16 NOTE — Telephone Encounter (Signed)
I called the patient back to follow up with him regarding his groin site. Per the patient, he did look at this and he states there is no drainage/ "crust" at the site. I advised him that I will have one of the triage nurses follow back up with him in the morning to assess. He is agreeable.

## 2014-04-16 NOTE — Telephone Encounter (Signed)
[name], PV tech, approached triage this morning about a patient complaint. [name] was cathed on 04/09/14 by Dr. [name]. He had a venous and arterial stick. He was scanned on 12/17 due to an AV fistula. He was re-scanned yesterday to follow up. The patient developed bleeding at the groin site just as the scan on his groin site was finishing. Per [name], [name] tech doing the scan, she did notice some blood on the tip of the probe yesterday at the end of the scan. She cleaned the area and applied a band aid. The patient went home and took a picture of the site, which he emailed to [name] in [place] today. There is some blood tinged drainage noted to the lateral side of the venous and arterial sticks. I called the patient to follow up on the site this morning. He states that the picture he emailed was from yesterday when he got home from the scan. He reports he has not looked at the site today as he was in dialysis this morning. I have advised the patient to look at the site and to call us back today if this looks any worse. I have also advised that he clean the area with soap and water and not to apply creams of any kind. I have also advised that he apply a clean, dry gauze pad to the site and change this frequently to decrease moisture and infection to the area. He denies any fever. I have advised that he call immediately if he develops a fever or notices any drainage (white/ yellow) to the area. [name], RN clinical manager has been made aware of the situation. A safey zone will is to be completed by [name], PV tech. I will call the patient back later today to follow up and see if he has looked at the site.

## 2014-04-16 NOTE — Telephone Encounter (Signed)
Patient was seen in the [place] lab for testing on 04/15/14.  Patient called on 12/23 to report that he had been injured during the test.  He states the tech had to put a lot of pressure, and when she was done, there was a bleeding cut.  The tech put a bandaid on it.  When he got home, he had his wife look at it, and noted the wound looked like an incision, 21/2 - 3" in length.  He is concerned due to the location (in the left groin), because it is a damp, wet area.  I asked him to email me the picture, to put in his chart.  He states it is not currently bleeding, and was oozing yesterday. I told him that I would give the information to either the nurse manager, or the triage team, and someone would call him back.  He is available to come in to have it looked at after 2 PM on M-W-F, or anytime on Tues/Thurs (due to dialysis on MWF).

## 2014-04-17 NOTE — Telephone Encounter (Signed)
Spoke with patient today. He states groin site is draining just a little pinkish red drainage. He is trying to keep it clean and dry. Using DSD gauze to cover it and keep it dry. No redness, swelling. No fever or chills.

## 2014-04-21 ENCOUNTER — Ambulatory Visit (INDEPENDENT_AMBULATORY_CARE_PROVIDER_SITE_OTHER): Payer: Medicare Other | Admitting: Cardiovascular Disease

## 2014-04-21 ENCOUNTER — Encounter: Payer: Self-pay | Admitting: Cardiovascular Disease

## 2014-04-21 VITALS — BP 140/78 | HR 85 | Ht 68.0 in | Wt 285.0 lb

## 2014-04-21 DIAGNOSIS — I2510 Atherosclerotic heart disease of native coronary artery without angina pectoris: Secondary | ICD-10-CM

## 2014-04-21 DIAGNOSIS — S301XXD Contusion of abdominal wall, subsequent encounter: Secondary | ICD-10-CM

## 2014-04-21 MED ORDER — CEPHALEXIN 250 MG PO CAPS: 250.0000 mg | ORAL_CAPSULE | Freq: Two times a day (BID) | ORAL | Status: DC

## 2014-04-21 NOTE — Telephone Encounter (Signed)
I spoke with the patient today to follow up on his cath site. He states that he has changed the gauze pad today and notices that there is some drainage that is "gray" and it has some odor to it. I have advised him that we should see him today to follow up and assess for infection. He still denies fever. Dr. [name] is in the office this afternoon. His appointment with Truitt Merle, NP has been cancelled and rescheduled to today with Dr. [name] at 4:15 pm. The patient voices understanding.

## 2014-04-21 NOTE — Patient Instructions (Addendum)
Your physician recommends that you schedule a follow-up appointment in: 4 weeks. --May 20, 2014 at 3:00  Your physician has recommended you make the following change in your medication: Start Keflex 250 mg by mouth every 12 hours for 7 days.

## 2014-04-21 NOTE — Progress Notes (Signed)
History of Present Illness: 48 y.o. male with a history of CAD, DM, HTN, sleep apnea, morbid obesity, ESRD on HD, fibromyalgia, chronic anemia, status post left BKA who is here today for hospital cardiac follow up. He had previously undergone renal transplantation and had been off of dialysis. I met him in the hospital in February 2014 when he was admitted with chest pain. We did not plan a cardiac cath then due to his renal insufficiency. He has since been started back on HD. He was seen in the office October 2015 by Richardson Dopp, PA-C and c/o dyspnea with exertion. He denied chest discomfort. Echo demonstrated normal LVEF and mild diastolic dysfunction. A myoview was obtained and this was abnormal with inferior, apical and apical lateral ischemia (mod risk). Cardiac cath on 03/19/14 with severe calcified RCA stenosis that was treated with angioplasty but I could not expand the balloon. There appeared to be a small dissection so I brought him back for staged rotablator atherectomy of the mid RCA on 04/09/14. A 3.5 x 23 mm Xience DES was placed in the mid RCA. The distal RCA lesion was treated with balloon angioplasty alone. Of note, he had a severe rash following the first cath at which time he had been started on Plavix. His Plavix was changed to Effient and the rash resolved. He had no recurrence of rash following the PCI although he was pre-treated with steroids before the PCI. His left groin was accessed for the cardiac cath (no access in either arm due to HD and prior HD access in right groin). He had pain in his left groin post-procedure and u/s demonstrated left groin AV fistula.   He is here today for follow up. SOB unchanged. NO chest pain. Left groin wound is feeling better. Minimal foul smelling discharge yesterday.   Primary Care Physician:  Past Medical History  Diagnosis Date  . Hypertension   . History of blood transfusion     "related to dialysis"  . Dyspnea     a. 07/23/11  Echo: EF 55-60%  . Anemia of chronic disease   . Cholecystitis     a. 08/27/2011  . Constipation   . Pericardial effusion     a.  Small by CT 08/22/11;  b.  Large by CT 08/27/11  . Inguinal lymphadenopathy     a. bilateral - s/p biopsy 07/2011  . Fibromyalgia   . Hx of echocardiogram     Echo (11/15):  Mild LVH, EF 60-65%, no RWMA, Gr 1 DD, MAC, mild LAE, normal RVF, mild RAE  . Hx of cardiovascular stress test     Lexiscan Myoview (11/15):  Inf, apical cap and apical lateral ischemia, EF 56%, inf HK, Moderate Risk  . High cholesterol   . Pneumonia ~ 2007  . OSA (obstructive sleep apnea)     "I'm not doing the mask; claustrophobic"  . Claustrophobia   . Hypothyroidism   . Insulin dependent diabetes mellitus   . GERD (gastroesophageal reflux disease)   . Arthritis     "hands, right knee" (03/19/2014)  . ESRD (end stage renal disease)     a. 1995 s/p cadaveric transplant w/ susbequent failure after 18 yrs;  b.Dialysis initiated 07/2011  . CAD (coronary artery disease)     a. LHC (11/15):  mid to dist Dx 80%, mid RCA 99% (functional CTO), dist RCA 80% >> PCI: balloon angioplasty to mid RCA (could not deliver stent) - plan staged PCI with HSRA of  RCA; Dx to be tx medically     Past Surgical History  Procedure Laterality Date  . Kidney transplant  04/25/1993  . Cataract extraction w/ intraocular lens  implant, bilateral Bilateral 1990's  . Arteriovenous graft placement Left 1993?    forearm  . Arteriovenous graft placement Right 1993?    leg  . Toe amputation Bilateral after 2005  . Below knee leg amputation Left 2010  . Carpal tunnel release Bilateral after 2005  . Shoulder arthroscopy Left ~ 2006    frozen  . Av fistula placement  07/29/2011    Procedure: ARTERIOVENOUS (AV) FISTULA CREATION;  Surgeon: Mal Misty, MD;  Location: [place];  Service: Vascular;  Laterality: Right;  Brachial cephalic  . Insertion of dialysis catheter  08/01/2011    Procedure: INSERTION OF DIALYSIS CATHETER;   Surgeon: Rosetta Posner, MD;  Location: [place];  Service: Vascular;  Laterality: Right;  insertion of dialysis catheter on right internal jugular vein  . Lymph node biopsy  08/10/2011    Procedure: LYMPH NODE BIOPSY;  Surgeon: Merrie Roof, MD;  Location: [place];  Service: General;  Laterality: Left;  left groin lymph biopsy  . Colonoscopy  08/29/2011    Procedure: COLONOSCOPY;  Surgeon: Jerene Bears, MD;  Location: [place];  Service: Gastroenterology;  Laterality: N/A;  . Appendectomy  ~ 2006  . Hernia repair    . Umbilical hernia repair  1990's  . Cardiac catheterization  03/19/2014  . Av fistula placement Left 1993?    "attempted one in my wrist; didn't take"  . Neuroplasty / transposition ulnar nerve at elbow Left after 2005  . Venogram Right 07/28/2011    Procedure: VENOGRAM;  Surgeon: Conrad Montier, MD;  Location: [facility] CATH LAB;  Service: Cardiovascular;  Laterality: Right;  . Left heart catheterization with coronary angiogram N/A 03/19/2014    Procedure: LEFT HEART CATHETERIZATION WITH CORONARY ANGIOGRAM;  Surgeon: Burnell Blanks, MD;  Location: [facility] CATH LAB;  Service: Cardiovascular;  Laterality: N/A;  . Cardiac catheterization  03/19/2014    Procedure: CORONARY BALLOON ANGIOPLASTY;  Surgeon: Burnell Blanks, MD;  Location: [facility] CATH LAB;  Service: Cardiovascular;;  . Coronary angioplasty with stent placement  04/09/2014        ptca/des mid lad   . Angioplasty  04/09/2014    RCA   . Femoral artery repair  04/09/2014    ANGIOSEAL  . Eye surgery    . Percutaneous coronary rotoblator intervention (pci-r) N/A 04/09/2014    Procedure: PERCUTANEOUS CORONARY ROTOBLATOR INTERVENTION (PCI-R);  Surgeon: Burnell Blanks, MD;  Location: [facility] CATH LAB;  Service: Cardiovascular;  Laterality: N/A;    Current Outpatient Prescriptions  Medication Sig Dispense Refill  . allopurinol (ZYLOPRIM) 100 MG tablet Take 200 mg by mouth daily.     [name] Kitchen aspirin 81 MG tablet Take 81 mg by mouth  every evening.     [name] Kitchen atorvastatin (LIPITOR) 10 MG tablet Take 10 mg by mouth every evening.     . calcium acetate (PHOSLO) 667 MG capsule Take 1,334-2,001 mg by mouth See admin instructions. Take 2,001 mg (3 capsules) with meals and 1,334 mg (2 capsules) with snacks    . clopidogrel (PLAVIX) 75 MG tablet   12  . diphenhydrAMINE (BENADRYL) 25 mg capsule Take 1-2 capsules (25-50 mg total) by mouth every 6 (six) hours as needed for itching. 30 capsule 0  . docusate sodium (COLACE) 100 MG capsule Take 200 mg by mouth 3 (  three) times daily.    . famotidine (PEPCID) 20 MG tablet Take 1 tablet (20 mg total) by mouth daily. Take daily for 5 days, then resume Zantac 5 tablet 0  . furosemide (LASIX) 80 MG tablet Take 2 tablets (160 mg total) by mouth 2 (two) times daily. OK to take if you are still urinating and it is helpful.    [name] Kitchen GLUCAGON EMERGENCY 1 MG injection Inject 1 mg into the muscle daily as needed (low blood sugar).     [name] Kitchen HYDROmorphone (DILAUDID) 2 MG tablet Take 2 mg by mouth 3 (three) times daily as needed for severe pain.     . hydrOXYzine (ATARAX/VISTARIL) 25 MG tablet   0  . hydrOXYzine (VISTARIL) 25 MG capsule Take 25 mg by mouth 3 (three) times daily as needed for itching.    . Insulin Human (INSULIN PUMP) 100 unit/ml SOLN Inject into the skin continuous. Humalog    . isosorbide mononitrate (IMDUR) 30 MG 24 hr tablet Take 1 tablet (30 mg total) by mouth daily. 30 tablet 11  . levothyroxine (SYNTHROID, LEVOTHROID) 100 MCG tablet Take 100 mcg by mouth daily before breakfast.    . loratadine (CLARITIN) 10 MG tablet Take 10 mg by mouth daily.    . metoCLOPramide (REGLAN) 5 MG tablet Take 5 mg by mouth 4 (four) times daily - after meals and at bedtime.    . metoprolol succinate (TOPROL-XL) 25 MG 24 hr tablet Take 0.5 tablets (12.5 mg total) by mouth daily. 15 tablet 12  . nitroGLYCERIN (NITROSTAT) 0.4 MG SL tablet Place 0.4 mg under the tongue every 5 (five) minutes as needed for chest pain.       . prasugrel (EFFIENT) 10 MG TABS tablet Take 1 tablet (10 mg total) by mouth daily. 30 tablet 3  . predniSONE (DELTASONE) 20 MG tablet Take 2 tablets (40 mg total) by mouth daily with breakfast. 2 tabs daily x 4 days, 1 tab daily x 4 days, 1/2 tab daily x 4 days and stop 30 tablet 0  . promethazine (PHENERGAN) 25 MG tablet Take 25 mg by mouth Every 6 hours as needed for nausea or vomiting. For nausea    . ranitidine (ZANTAC) 150 MG tablet Take 1 tablet (150 mg total) by mouth 2 (two) times daily. HOLD for 5 days while on Pepcid (famotidine).    [name] Kitchen rOPINIRole (REQUIP) 2 MG tablet Take 2 mg by mouth daily with supper.      No current facility-administered medications for this visit.    Allergies  Allergen Reactions  . Doxycycline     RASH  . Hydrocodone     RASH  . Morphine And Related Hives and Nausea Only  . Amoxicillin Other (See Comments)    Reaction unknown  . Ciprofloxacin Other (See Comments)    Reaction unknown  . Clindamycin/Lincomycin Other (See Comments)    Reaction unknown  . Codeine Other (See Comments)    Reaction unknown. But pt has tolerated hydrocodone in Vicodin and Norco in the past  . Levofloxacin Other (See Comments)    Reaction unknown  . Vasotec Cough  . Contrast Media [Iodinated Diagnostic Agents] Rash  . Oxycodone Rash  . Plavix [Clopidogrel Bisulfate] Rash    History   Social History  . Marital Status: Married    Spouse Name: N/A    Number of Children: N/A  . Years of Education: N/A   Occupational History  . Disabled    Social History Main Topics  .  Smoking status: Never Smoker   . Smokeless tobacco: Never Used  . Alcohol Use: Yes     Comment: 03/19/2014 "might have a drink 2-3 times/year"  . Drug Use: No  . Sexual Activity: Not Currently   Other Topics Concern  . Not on file   Social History Narrative   Lives @ home with his wife in [place].     Family History  Problem Relation Age of Onset  . Malignant hyperthermia Neg Hx   .  Colon polyps Father   . Diabetes Father   . Cancer Father   . Breast cancer Maternal Aunt   . Cancer Maternal Aunt     Breast and Bone  . Heart attack Neg Hx   . Stroke Neg Hx   . Hypertension Mother   . Hypertension Father   . Hypertension Father     Review of Systems:  As stated in the HPI and otherwise negative.   BP 140/78 mmHg  Pulse 85  Ht '5\' 8"'  (1.727 m)  Wt 285 lb (129.275 kg)  BMI 43.34 kg/m2  SpO2 97%  Physical Examination: General: Well developed, well nourished, NAD HEENT: OP clear, mucus membranes moist SKIN: warm, dry. No rashes. Neuro: No focal deficits Musculoskeletal: Muscle strength 5/5 all ext Psychiatric: Mood and affect normal Neck: No JVD, no carotid bruits, no thyromegaly, no lymphadenopathy. Lungs:Clear bilaterally, no wheezes, rhonci, crackles Cardiovascular: Regular rate and rhythm. No murmurs, gallops or rubs. Abdomen:Soft. Bowel sounds present. Non-tender.  Extremities: Left BKA. Left groin with bruising but no erythema. Soft. No discharge although slight tan discharge on bandage.   Cardiac cath 03/19/14: Left main: No obstructive disease.  Left Anterior Descending Artery: Large caliber vessel that courses to the apex. There is diffuse mild plaque in the proximal and mid vessel. There is a moderate caliber diagonal branch with mid to distal 80% stenosis.  Circumflex Artery: Large vessel with large obtuse marginal branch. Mild diffuse plaque.  Right Coronary Artery: Large caliber dominant vessel with 99% sub-total occlusion (functional CTO) with TIMI-1 flow noted beyond the stenosis. The distal vessel is also noted to have a 80% stenosis. The distal vessel fills from left to right collaterals.  Left Ventricular Angiogram: Deferred.   Assessment and Plan:   1. CAD: Stable following recent PCI of the RCA with DES x 1. Continue ASA, Effient, statin, Imdur, beta blocker. Dyspnea persists. I think his dyspnea is most likely non-cardiac.   2. ESRD  on HD  3. Left groin hematoma/AV fistula post cath: Hematoma resolving. Wound is clean. Slight discharge on bandage. He reports foul smelling discharge. Will start Keflex 250 mg po BID for 7 days.

## 2014-04-22 ENCOUNTER — Ambulatory Visit: Payer: Medicare Other | Admitting: Nurse Practitioner

## 2014-04-28 ENCOUNTER — Telehealth: Payer: Self-pay | Admitting: Cardiovascular Disease

## 2014-04-28 NOTE — Telephone Encounter (Signed)
Call transferred into triage. Pt c/o SOB that he thinks started around the time he started Effient and getting worse. He had dialysis yesterday and was below his dry weight but was given oxygen for SOB at treatment. Asked about his O2 saturation--pt states they didn't have a pulse oximeter to check. States that he had to stop and rest during a walk from from his dialysis chair to the lobby. SOB improved with rest.  Has to lie on right side to sleep.  Would like Dr. Camillia Herter recommendations and voices that this is not urgent.  Aware he is not back in office until Thursday.  Advised him to continue to Effient until he hears otherwise from MD. Rosezena Sensor understanding and agreement.

## 2014-04-28 NOTE — Telephone Encounter (Signed)
Effient does not typically cause SOB. I would not think the SOB is a side effect of the Effient. I would encourage him to continue the Effient for now. He is allergic to Plavix. Our only other option would be Brilinta which has a side effect of SOB. Thanks, chris

## 2014-04-28 NOTE — Telephone Encounter (Signed)
New Message  Pt c/o Shortness Of Breath: STAT if SOB developed within the last 24 hours or pt is noticeably SOB on the phone  1. Are you currently SOB (can you hear that pt is SOB on the phone)? Currently experiencing now (mild now; after exertion it gets worse) 2. How long have you been experiencing SOB? Gradually getting worse; really after new medicine 3. Are you SOB when sitting or when up moving around? Laying down, not straight up; Moving around 4.  Are you currently experiencing any other symptoms? No.

## 2014-04-29 ENCOUNTER — Ambulatory Visit: Payer: Medicare Other | Admitting: Physician Assistant

## 2014-04-29 NOTE — Telephone Encounter (Signed)
Spoke with pt and gave him information from Dr. [name]. His primary care is provided by his kidney doctor and pt reports he will discuss referral to pulmonary with kidney doctor.

## 2014-05-13 ENCOUNTER — Other Ambulatory Visit: Payer: Self-pay | Admitting: Nephrology

## 2014-05-13 ENCOUNTER — Ambulatory Visit
Admission: RE | Admit: 2014-05-13 | Discharge: 2014-05-13 | Disposition: A | Discharge status: Home / Self Care | Payer: Medicare Other | Source: Ambulatory Visit | Attending: Nephrology | Admitting: Nephrology

## 2014-05-13 DIAGNOSIS — R0602 Shortness of breath: Secondary | ICD-10-CM

## 2014-05-20 ENCOUNTER — Encounter: Payer: Self-pay | Admitting: Cardiovascular Disease

## 2014-05-20 ENCOUNTER — Ambulatory Visit (INDEPENDENT_AMBULATORY_CARE_PROVIDER_SITE_OTHER): Payer: Medicare Other | Admitting: Cardiovascular Disease

## 2014-05-20 VITALS — BP 146/72 | HR 89 | Ht 68.0 in | Wt 283.6 lb

## 2014-05-20 DIAGNOSIS — I2510 Atherosclerotic heart disease of native coronary artery without angina pectoris: Secondary | ICD-10-CM

## 2014-05-20 DIAGNOSIS — R0600 Dyspnea, unspecified: Secondary | ICD-10-CM

## 2014-05-20 NOTE — Progress Notes (Signed)
History of Present Illness: 49 y.o. male with a history of CAD, DM, HTN, sleep apnea, morbid obesity, ESRD on HD, fibromyalgia, chronic anemia, status post left BKA who is here today for hospital cardiac follow up. He had previously undergone renal transplantation and had been off of dialysis. I met him in the hospital in February 2014 when he was admitted with chest pain. We did not plan a cardiac cath then due to his renal insufficiency. He has since been started back on HD. He was seen in the office October 2015 by Richardson Dopp, PA-C and c/o dyspnea with exertion. He denied chest discomfort. Echo demonstrated normal LVEF and mild diastolic dysfunction. A myoview was obtained and this was abnormal with inferior, apical and apical lateral ischemia (mod risk). Cardiac cath on 03/19/14 with severe calcified RCA stenosis that was treated with angioplasty but I could not expand the balloon. There appeared to be a small dissection so I brought him back for staged rotablator atherectomy of the mid RCA on 04/09/14. A 3.5 x 23 mm Xience DES was placed in the mid RCA. The distal RCA lesion was treated with balloon angioplasty alone. Of note, he had a severe rash following the first cath at which time he had been started on Plavix. His Plavix was changed to Effient and the rash resolved. He had no recurrence of rash following the PCI although he was pre-treated with steroids before the PCI. His left groin was accessed for the cardiac cath (no access in either arm due to HD and prior HD access in right groin). He had pain in his left groin post-procedure and u/s demonstrated left groin AV fistula. The pain in his left groin resolved the following week.   He is here today for follow up. SOB unchanged. NO chest pain. Recent chest x-ray with evidence of mild CHF, right pleural effusion.   Nephrology: [facility]  Past Medical History  Diagnosis Date  . Hypertension   . History of blood transfusion     "related  to dialysis"  . Dyspnea     a. 07/23/11 Echo: EF 55-60%  . Anemia of chronic disease   . Cholecystitis     a. 08/27/2011  . Constipation   . Pericardial effusion     a.  Small by CT 08/22/11;  b.  Large by CT 08/27/11  . Inguinal lymphadenopathy     a. bilateral - s/p biopsy 07/2011  . Fibromyalgia   . Hx of echocardiogram     Echo (11/15):  Mild LVH, EF 60-65%, no RWMA, Gr 1 DD, MAC, mild LAE, normal RVF, mild RAE  . Hx of cardiovascular stress test     Lexiscan Myoview (11/15):  Inf, apical cap and apical lateral ischemia, EF 56%, inf HK, Moderate Risk  . High cholesterol   . Pneumonia ~ 2007  . OSA (obstructive sleep apnea)     "I'm not doing the mask; claustrophobic"  . Claustrophobia   . Hypothyroidism   . Insulin dependent diabetes mellitus   . GERD (gastroesophageal reflux disease)   . Arthritis     "hands, right knee" (03/19/2014)  . ESRD (end stage renal disease)     a. 1995 s/p cadaveric transplant w/ susbequent failure after 18 yrs;  b.Dialysis initiated 07/2011  . CAD (coronary artery disease)     a. LHC (11/15):  mid to dist Dx 80%, mid RCA 99% (functional CTO), dist RCA 80% >> PCI: balloon angioplasty to mid RCA (could not  deliver stent) - plan staged PCI with HSRA of RCA; Dx to be tx medically     Past Surgical History  Procedure Laterality Date  . Kidney transplant  04/25/1993  . Cataract extraction w/ intraocular lens  implant, bilateral Bilateral 1990's  . Arteriovenous graft placement Left 1993?    forearm  . Arteriovenous graft placement Right 1993?    leg  . Toe amputation Bilateral after 2005  . Below knee leg amputation Left 2010  . Carpal tunnel release Bilateral after 2005  . Shoulder arthroscopy Left ~ 2006    frozen  . Av fistula placement  07/29/2011    Procedure: ARTERIOVENOUS (AV) FISTULA CREATION;  Surgeon: Mal Misty, MD;  Location: [place];  Service: Vascular;  Laterality: Right;  Brachial cephalic  . Insertion of dialysis catheter  08/01/2011     Procedure: INSERTION OF DIALYSIS CATHETER;  Surgeon: Rosetta Posner, MD;  Location: [place];  Service: Vascular;  Laterality: Right;  insertion of dialysis catheter on right internal jugular vein  . Lymph node biopsy  08/10/2011    Procedure: LYMPH NODE BIOPSY;  Surgeon: Merrie Roof, MD;  Location: [facility];  Service: General;  Laterality: Left;  left groin lymph biopsy  . Colonoscopy  08/29/2011    Procedure: COLONOSCOPY;  Surgeon: Jerene Bears, MD;  Location: [place];  Service: Gastroenterology;  Laterality: N/A;  . Appendectomy  ~ 2006  . Hernia repair    . Umbilical hernia repair  1990's  . Cardiac catheterization  03/19/2014  . Av fistula placement Left 1993?    "attempted one in my wrist; didn't take"  . Neuroplasty / transposition ulnar nerve at elbow Left after 2005  . Venogram Right 07/28/2011    Procedure: VENOGRAM;  Surgeon: Conrad Martinsville, MD;  Location: [facility] CATH LAB;  Service: Cardiovascular;  Laterality: Right;  . Left heart catheterization with coronary angiogram N/A 03/19/2014    Procedure: LEFT HEART CATHETERIZATION WITH CORONARY ANGIOGRAM;  Surgeon: Burnell Blanks, MD;  Location: [facility] CATH LAB;  Service: Cardiovascular;  Laterality: N/A;  . Cardiac catheterization  03/19/2014    Procedure: CORONARY BALLOON ANGIOPLASTY;  Surgeon: Burnell Blanks, MD;  Location: [facility] CATH LAB;  Service: Cardiovascular;;  . Coronary angioplasty with stent placement  04/09/2014        ptca/des mid lad   . Angioplasty  04/09/2014    RCA   . Femoral artery repair  04/09/2014    ANGIOSEAL  . Eye surgery    . Percutaneous coronary rotoblator intervention (pci-r) N/A 04/09/2014    Procedure: PERCUTANEOUS CORONARY ROTOBLATOR INTERVENTION (PCI-R);  Surgeon: Burnell Blanks, MD;  Location: [facility] CATH LAB;  Service: Cardiovascular;  Laterality: N/A;    Current Outpatient Prescriptions  Medication Sig Dispense Refill  . allopurinol (ZYLOPRIM) 100 MG tablet Take 200 mg by mouth daily.       [name] Kitchen aspirin 81 MG tablet Take 81 mg by mouth every evening.     [name] Kitchen atorvastatin (LIPITOR) 10 MG tablet Take 10 mg by mouth every evening.     . calcium acetate (PHOSLO) 667 MG capsule Take 1,334-2,001 mg by mouth See admin instructions. Take 2,001 mg (3 capsules) with meals and 1,334 mg (2 capsules) with snacks    . cephALEXin (KEFLEX) 250 MG capsule Take 1 capsule (250 mg total) by mouth every 12 (twelve) hours. 14 capsule 0  . clopidogrel (PLAVIX) 75 MG tablet   12  . diphenhydrAMINE (BENADRYL) 25 mg capsule Take  1-2 capsules (25-50 mg total) by mouth every 6 (six) hours as needed for itching. 30 capsule 0  . docusate sodium (COLACE) 100 MG capsule Take 200 mg by mouth 3 (three) times daily.    . famotidine (PEPCID) 20 MG tablet Take 1 tablet (20 mg total) by mouth daily. Take daily for 5 days, then resume Zantac 5 tablet 0  . furosemide (LASIX) 80 MG tablet Take 2 tablets (160 mg total) by mouth 2 (two) times daily. OK to take if you are still urinating and it is helpful.    [name] Kitchen GLUCAGON EMERGENCY 1 MG injection Inject 1 mg into the muscle daily as needed (low blood sugar).     [name] Kitchen HYDROmorphone (DILAUDID) 2 MG tablet Take 2 mg by mouth 3 (three) times daily as needed for severe pain.     . hydrOXYzine (ATARAX/VISTARIL) 25 MG tablet   0  . hydrOXYzine (VISTARIL) 25 MG capsule Take 25 mg by mouth 3 (three) times daily as needed for itching.    . Insulin Human (INSULIN PUMP) 100 unit/ml SOLN Inject into the skin continuous. Humalog    . isosorbide mononitrate (IMDUR) 30 MG 24 hr tablet Take 1 tablet (30 mg total) by mouth daily. 30 tablet 11  . levothyroxine (SYNTHROID, LEVOTHROID) 100 MCG tablet Take 100 mcg by mouth daily before breakfast.    . loratadine (CLARITIN) 10 MG tablet Take 10 mg by mouth daily.    . metoCLOPramide (REGLAN) 5 MG tablet Take 5 mg by mouth 4 (four) times daily - after meals and at bedtime.    . metoprolol succinate (TOPROL-XL) 25 MG 24 hr tablet Take 0.5 tablets (12.5 mg  total) by mouth daily. 15 tablet 12  . nitroGLYCERIN (NITROSTAT) 0.4 MG SL tablet Place 0.4 mg under the tongue every 5 (five) minutes as needed for chest pain.     . prasugrel (EFFIENT) 10 MG TABS tablet Take 1 tablet (10 mg total) by mouth daily. 30 tablet 3  . predniSONE (DELTASONE) 20 MG tablet Take 2 tablets (40 mg total) by mouth daily with breakfast. 2 tabs daily x 4 days, 1 tab daily x 4 days, 1/2 tab daily x 4 days and stop 30 tablet 0  . promethazine (PHENERGAN) 25 MG tablet Take 25 mg by mouth Every 6 hours as needed for nausea or vomiting. For nausea    . ranitidine (ZANTAC) 150 MG tablet Take 1 tablet (150 mg total) by mouth 2 (two) times daily. HOLD for 5 days while on Pepcid (famotidine).    [name] Kitchen rOPINIRole (REQUIP) 2 MG tablet Take 2 mg by mouth daily with supper.      No current facility-administered medications for this visit.    Allergies  Allergen Reactions  . Doxycycline     RASH  . Hydrocodone     RASH  . Morphine And Related Hives and Nausea Only  . Amoxicillin Other (See Comments)    Reaction unknown  . Ciprofloxacin Other (See Comments)    Reaction unknown  . Clindamycin/Lincomycin Other (See Comments)    Reaction unknown  . Codeine Other (See Comments)    Reaction unknown. But pt has tolerated hydrocodone in Vicodin and Norco in the past  . Levofloxacin Other (See Comments)    Reaction unknown  . Vasotec Cough  . Contrast Media [Iodinated Diagnostic Agents] Rash  . Oxycodone Rash  . Plavix [Clopidogrel Bisulfate] Rash    History   Social History  . Marital Status: Married    Spouse  Name: N/A    Number of Children: N/A  . Years of Education: N/A   Occupational History  . Disabled    Social History Main Topics  . Smoking status: Never Smoker   . Smokeless tobacco: Never Used  . Alcohol Use: Yes     Comment: 03/19/2014 "might have a drink 2-3 times/year"  . Drug Use: No  . Sexual Activity: Not Currently   Other Topics Concern  . Not on file    Social History Narrative   Lives @ home with his wife in [place].     Family History  Problem Relation Age of Onset  . Malignant hyperthermia Neg Hx   . Colon polyps Father   . Diabetes Father   . Cancer Father   . Breast cancer Maternal Aunt   . Cancer Maternal Aunt     Breast and Bone  . Heart attack Neg Hx   . Stroke Neg Hx   . Hypertension Mother   . Hypertension Father   . Hypertension Father     Review of Systems:  As stated in the HPI and otherwise negative.   BP 146/72 mmHg  Pulse 89  Ht _0  (1.727 m)  Wt 283 lb 9.6 oz (128.64 kg)  BMI 43.13 kg/m2  SpO2 90%  Physical Examination: General: Well developed, well nourished, NAD HEENT: OP clear, mucus membranes moist SKIN: warm, dry. No rashes. Neuro: No focal deficits Musculoskeletal: Muscle strength 5/5 all ext Psychiatric: Mood and affect normal Neck: No JVD, no carotid bruits, no thyromegaly, no lymphadenopathy. Lungs:Clear bilaterally, no wheezes, rhonci, crackles Cardiovascular: Regular rate and rhythm. No murmurs, gallops or rubs. Abdomen:Soft. Bowel sounds present. Non-tender.  Extremities: Left BKA. Left groin with bruising but no erythema. Soft. No discharge although slight tan discharge on bandage.   Cardiac cath 03/19/14: Left main: No obstructive disease.  Left Anterior Descending Artery: Large caliber vessel that courses to the apex. There is diffuse mild plaque in the proximal and mid vessel. There is a moderate caliber diagonal branch with mid to distal 80% stenosis.  Circumflex Artery: Large vessel with large obtuse marginal branch. Mild diffuse plaque.  Right Coronary Artery: Large caliber dominant vessel with 99% sub-total occlusion (functional CTO) with TIMI-1 flow noted beyond the stenosis. The distal vessel is also noted to have a 80% stenosis. The distal vessel fills from left to right collaterals.  Left Ventricular Angiogram: Deferred.   Assessment and Plan:   1. CAD: Stable  following recent PCI of the RCA with DES x 1. Continue ASA, Effient, statin, Imdur, beta blocker. Dyspnea persists. I think his dyspnea is most likely non-cardiac. I think it would be best to try and drive down his dry weight with HD. He will discuss with Nephrology.   2. ESRD on HD  3. Left groin hematoma/AV fistula post cath: Hematoma resolved. No pain.

## 2014-05-20 NOTE — Patient Instructions (Signed)
Your physician wants you to follow-up in:  6 months. You will receive a reminder letter in the mail two months in advance. If you don't receive a letter, please call our office to schedule the follow-up appointment.   

## 2014-07-01 ENCOUNTER — Telehealth: Payer: Self-pay

## 2014-07-01 NOTE — Telephone Encounter (Signed)
Gave patient samples of effient at the front desk

## 2014-07-07 ENCOUNTER — Other Ambulatory Visit: Payer: Self-pay | Admitting: Nephrology

## 2014-07-07 DIAGNOSIS — N644 Mastodynia: Secondary | ICD-10-CM

## 2014-07-07 DIAGNOSIS — R609 Edema, unspecified: Secondary | ICD-10-CM

## 2014-07-08 ENCOUNTER — Other Ambulatory Visit: Payer: Self-pay | Admitting: Nephrology

## 2014-07-08 DIAGNOSIS — N644 Mastodynia: Secondary | ICD-10-CM

## 2014-07-08 DIAGNOSIS — R609 Edema, unspecified: Secondary | ICD-10-CM

## 2014-07-10 ENCOUNTER — Ambulatory Visit
Admission: RE | Admit: 2014-07-10 | Discharge: 2014-07-10 | Disposition: A | Discharge status: Home / Self Care | Payer: Medicare Other | Source: Ambulatory Visit | Attending: Nephrology | Admitting: Nephrology

## 2014-07-10 DIAGNOSIS — R609 Edema, unspecified: Secondary | ICD-10-CM

## 2014-07-10 DIAGNOSIS — N644 Mastodynia: Secondary | ICD-10-CM

## 2014-09-17 ENCOUNTER — Emergency Department (HOSPITAL_COMMUNITY): Payer: Medicare Other

## 2014-09-17 ENCOUNTER — Observation Stay (HOSPITAL_COMMUNITY)
Admission: EM | Admit: 2014-09-17 | Discharge: 2014-09-19 | Disposition: A | Discharge status: Home / Self Care | Payer: Medicare Other | Attending: Internal Medicine | Admitting: Internal Medicine

## 2014-09-17 ENCOUNTER — Encounter (HOSPITAL_COMMUNITY): Payer: Self-pay | Admitting: Emergency Medicine

## 2014-09-17 DIAGNOSIS — Z79899 Other long term (current) drug therapy: Secondary | ICD-10-CM | POA: Diagnosis not present

## 2014-09-17 DIAGNOSIS — K219 Gastro-esophageal reflux disease without esophagitis: Secondary | ICD-10-CM | POA: Diagnosis not present

## 2014-09-17 DIAGNOSIS — N186 End stage renal disease: Secondary | ICD-10-CM | POA: Diagnosis not present

## 2014-09-17 DIAGNOSIS — Z940 Kidney transplant status: Secondary | ICD-10-CM | POA: Diagnosis not present

## 2014-09-17 DIAGNOSIS — Z7902 Long term (current) use of antithrombotics/antiplatelets: Secondary | ICD-10-CM | POA: Diagnosis not present

## 2014-09-17 DIAGNOSIS — I120 Hypertensive chronic kidney disease with stage 5 chronic kidney disease or end stage renal disease: Secondary | ICD-10-CM | POA: Diagnosis not present

## 2014-09-17 DIAGNOSIS — E780 Pure hypercholesterolemia: Secondary | ICD-10-CM | POA: Insufficient documentation

## 2014-09-17 DIAGNOSIS — F40240 Claustrophobia: Secondary | ICD-10-CM | POA: Diagnosis not present

## 2014-09-17 DIAGNOSIS — Z992 Dependence on renal dialysis: Secondary | ICD-10-CM | POA: Diagnosis not present

## 2014-09-17 DIAGNOSIS — Z9861 Coronary angioplasty status: Secondary | ICD-10-CM | POA: Insufficient documentation

## 2014-09-17 DIAGNOSIS — R079 Chest pain, unspecified: Secondary | ICD-10-CM | POA: Diagnosis not present

## 2014-09-17 DIAGNOSIS — D631 Anemia in chronic kidney disease: Secondary | ICD-10-CM | POA: Insufficient documentation

## 2014-09-17 DIAGNOSIS — K5900 Constipation, unspecified: Secondary | ICD-10-CM | POA: Diagnosis not present

## 2014-09-17 DIAGNOSIS — Z7982 Long term (current) use of aspirin: Secondary | ICD-10-CM | POA: Insufficient documentation

## 2014-09-17 DIAGNOSIS — M1990 Unspecified osteoarthritis, unspecified site: Secondary | ICD-10-CM | POA: Insufficient documentation

## 2014-09-17 DIAGNOSIS — Z8701 Personal history of pneumonia (recurrent): Secondary | ICD-10-CM | POA: Diagnosis not present

## 2014-09-17 DIAGNOSIS — R072 Precordial pain: Secondary | ICD-10-CM | POA: Diagnosis not present

## 2014-09-17 DIAGNOSIS — I10 Essential (primary) hypertension: Secondary | ICD-10-CM | POA: Diagnosis present

## 2014-09-17 DIAGNOSIS — Z880 Allergy status to penicillin: Secondary | ICD-10-CM | POA: Insufficient documentation

## 2014-09-17 DIAGNOSIS — I2510 Atherosclerotic heart disease of native coronary artery without angina pectoris: Secondary | ICD-10-CM

## 2014-09-17 DIAGNOSIS — I25110 Atherosclerotic heart disease of native coronary artery with unstable angina pectoris: Secondary | ICD-10-CM

## 2014-09-17 DIAGNOSIS — E039 Hypothyroidism, unspecified: Secondary | ICD-10-CM | POA: Diagnosis not present

## 2014-09-17 DIAGNOSIS — G4733 Obstructive sleep apnea (adult) (pediatric): Secondary | ICD-10-CM | POA: Insufficient documentation

## 2014-09-17 DIAGNOSIS — Z794 Long term (current) use of insulin: Secondary | ICD-10-CM | POA: Insufficient documentation

## 2014-09-17 LAB — CREATININE, SERUM
Creatinine, Ser: 6.18 mg/dL — ABNORMAL HIGH (ref 0.61–1.24)
GFR calc Af Amer: 11 mL/min — ABNORMAL LOW (ref >60)
GFR calc non Af Amer: 10 mL/min — ABNORMAL LOW (ref >60)

## 2014-09-17 LAB — CBC
HCT: 33.5 % — ABNORMAL LOW (ref 39.0–52.0)
Hemoglobin: 10.6 g/dL — ABNORMAL LOW (ref 13.0–17.0)
MCH: 33.2 pg (ref 26.0–34.0)
MCHC: 31.6 g/dL (ref 30.0–36.0)
MCV: 105 fL — ABNORMAL HIGH (ref 78.0–100.0)
Platelets: 228 10*3/uL (ref 150–400)
RBC: 3.19 MIL/uL — ABNORMAL LOW (ref 4.22–5.81)
RDW: 14.4 % (ref 11.5–15.5)
WBC: 7.1 10*3/uL (ref 4.0–10.5)

## 2014-09-17 LAB — CBC WITH DIFFERENTIAL/PLATELET
Basophils Absolute: 0 10*3/uL (ref 0.0–0.1)
Basophils Relative: 0 % (ref 0–1)
Eosinophils Absolute: 0.2 10*3/uL (ref 0.0–0.7)
Eosinophils Relative: 3 % (ref 0–5)
HCT: 33.2 % — ABNORMAL LOW (ref 39.0–52.0)
Hemoglobin: 10.7 g/dL — ABNORMAL LOW (ref 13.0–17.0)
Lymphocytes Relative: 21 % (ref 12–46)
Lymphs Abs: 1.7 10*3/uL (ref 0.7–4.0)
MCH: 33.5 pg (ref 26.0–34.0)
MCHC: 32.2 g/dL (ref 30.0–36.0)
MCV: 104.1 fL — ABNORMAL HIGH (ref 78.0–100.0)
Monocytes Absolute: 0.5 10*3/uL (ref 0.1–1.0)
Monocytes Relative: 6 % (ref 3–12)
Neutro Abs: 5.6 10*3/uL (ref 1.7–7.7)
Neutrophils Relative %: 70 % (ref 43–77)
Platelets: 219 10*3/uL (ref 150–400)
RBC: 3.19 MIL/uL — ABNORMAL LOW (ref 4.22–5.81)
RDW: 14.4 % (ref 11.5–15.5)
WBC: 8 10*3/uL (ref 4.0–10.5)

## 2014-09-17 LAB — GLUCOSE, CAPILLARY
Glucose-Capillary: 171 mg/dL — ABNORMAL HIGH (ref 65–99)
Glucose-Capillary: 122 mg/dL — ABNORMAL HIGH (ref 65–99)

## 2014-09-17 LAB — TROPONIN I
Troponin I: <0.03 ng/mL (ref <0.031)
Troponin I: <0.03 ng/mL (ref <0.031)

## 2014-09-17 LAB — BASIC METABOLIC PANEL
Anion gap: 11 (ref 5–15)
BUN: 30 mg/dL — ABNORMAL HIGH (ref 6–20)
CO2: 28 mmol/L (ref 22–32)
Calcium: 8.5 mg/dL — ABNORMAL LOW (ref 8.9–10.3)
Chloride: 93 mmol/L — ABNORMAL LOW (ref 101–111)
Creatinine, Ser: 5.24 mg/dL — ABNORMAL HIGH (ref 0.61–1.24)
GFR calc Af Amer: 14 mL/min — ABNORMAL LOW (ref >60)
GFR calc non Af Amer: 12 mL/min — ABNORMAL LOW (ref >60)
Glucose, Bld: 225 mg/dL — ABNORMAL HIGH (ref 65–99)
Potassium: 4.1 mmol/L (ref 3.5–5.1)
Sodium: 132 mmol/L — ABNORMAL LOW (ref 135–145)

## 2014-09-17 MED ORDER — INSULIN PUMP: SUBCUTANEOUS | Status: DC

## 2014-09-17 MED ORDER — HYDROMORPHONE HCL 1 MG/ML IJ SOLN: 1.0000 mg | INTRAMUSCULAR | Status: DC | PRN

## 2014-09-17 MED ORDER — ENOXAPARIN SODIUM 30 MG/0.3ML ~~LOC~~ SOLN
30.0000 mg | SUBCUTANEOUS | Status: DC
  Administered 2014-09-17 – 2014-09-18 (×2): 30 mg via SUBCUTANEOUS
  Filled 2014-09-17 (×2): qty 0.3

## 2014-09-17 MED ORDER — ONDANSETRON HCL 4 MG/2ML IJ SOLN
4.0000 mg | Freq: Once | INTRAMUSCULAR | Status: AC
  Administered 2014-09-17: 4 mg via INTRAVENOUS
  Filled 2014-09-17: qty 2

## 2014-09-17 MED ORDER — ASPIRIN 81 MG PO TABS: 81.0000 mg | ORAL_TABLET | Freq: Every evening | ORAL | Status: DC

## 2014-09-17 MED ORDER — CALCIUM ACETATE (PHOS BINDER) 667 MG PO CAPS
1334.0000 mg | ORAL_CAPSULE | Freq: Three times a day (TID) | ORAL | Status: DC
  Administered 2014-09-17 – 2014-09-18 (×3): 1334 mg via ORAL
  Filled 2014-09-17 (×8): qty 2

## 2014-09-17 MED ORDER — ROPINIROLE HCL 1 MG PO TABS
2.0000 mg | ORAL_TABLET | Freq: Every day | ORAL | Status: DC
  Administered 2014-09-17 – 2014-09-18 (×2): 2 mg via ORAL
  Filled 2014-09-17 (×2): qty 2

## 2014-09-17 MED ORDER — ASPIRIN EC 81 MG PO TBEC
81.0000 mg | DELAYED_RELEASE_TABLET | Freq: Every day | ORAL | Status: DC
  Administered 2014-09-17 – 2014-09-19 (×3): 81 mg via ORAL
  Filled 2014-09-17 (×2): qty 1

## 2014-09-17 MED ORDER — FAMOTIDINE 20 MG PO TABS
20.0000 mg | ORAL_TABLET | Freq: Every day | ORAL | Status: DC
  Administered 2014-09-17 – 2014-09-19 (×3): 20 mg via ORAL
  Filled 2014-09-17 (×3): qty 1

## 2014-09-17 MED ORDER — HYDROMORPHONE HCL 2 MG PO TABS
2.0000 mg | ORAL_TABLET | Freq: Three times a day (TID) | ORAL | Status: DC | PRN
Start: 2014-09-17 — End: 2014-09-18
  Administered 2014-09-18: 2 mg via ORAL
  Filled 2014-09-17: qty 1

## 2014-09-17 MED ORDER — DOCUSATE SODIUM 100 MG PO CAPS: 200.0000 mg | ORAL_CAPSULE | Freq: Three times a day (TID) | ORAL | Status: DC | PRN

## 2014-09-17 MED ORDER — FENTANYL CITRATE (PF) 100 MCG/2ML IJ SOLN
100.0000 ug | Freq: Once | INTRAMUSCULAR | Status: AC
  Administered 2014-09-17: 100 ug via INTRAVENOUS
  Filled 2014-09-17: qty 2

## 2014-09-17 MED ORDER — SEVELAMER CARBONATE 800 MG PO TABS
1600.0000 mg | ORAL_TABLET | Freq: Three times a day (TID) | ORAL | Status: DC
  Administered 2014-09-17 – 2014-09-19 (×4): 1600 mg via ORAL
  Filled 2014-09-17 (×4): qty 2

## 2014-09-17 MED ORDER — INSULIN PUMP
Freq: Three times a day (TID) | SUBCUTANEOUS | Status: DC
  Filled 2014-09-17: qty 1

## 2014-09-17 MED ORDER — CALCIUM ACETATE (PHOS BINDER) 667 MG PO CAPS
667.0000 mg | ORAL_CAPSULE | Freq: Every day | ORAL | Status: DC | PRN
  Filled 2014-09-17: qty 1

## 2014-09-17 MED ORDER — ONDANSETRON HCL 4 MG/2ML IJ SOLN
4.0000 mg | Freq: Four times a day (QID) | INTRAMUSCULAR | Status: DC | PRN
  Administered 2014-09-18 – 2014-09-19 (×2): 4 mg via INTRAVENOUS
  Filled 2014-09-17 (×2): qty 2

## 2014-09-17 MED ORDER — INSULIN ASPART 100 UNIT/ML ~~LOC~~ SOLN
0.0000 [IU] | Freq: Every day | SUBCUTANEOUS | Status: DC
Start: 2014-09-17 — End: 2014-09-17

## 2014-09-17 MED ORDER — HYDROMORPHONE HCL 2 MG PO TABS: 2.0000 mg | ORAL_TABLET | Freq: Three times a day (TID) | ORAL | Status: DC | PRN

## 2014-09-17 MED ORDER — HYDROMORPHONE HCL 1 MG/ML IJ SOLN
1.0000 mg | Freq: Four times a day (QID) | INTRAMUSCULAR | Status: DC | PRN
  Administered 2014-09-17 – 2014-09-19 (×6): 1 mg via INTRAVENOUS
  Filled 2014-09-17 (×6): qty 1

## 2014-09-17 MED ORDER — ALLOPURINOL 100 MG PO TABS
200.0000 mg | ORAL_TABLET | Freq: Every day | ORAL | Status: DC
  Administered 2014-09-17 – 2014-09-19 (×3): 200 mg via ORAL
  Filled 2014-09-17 (×3): qty 2

## 2014-09-17 MED ORDER — CALCIUM ACETATE 667 MG PO CAPS: 667.0000 mg | ORAL_CAPSULE | ORAL | Status: DC

## 2014-09-17 MED ORDER — ACETAMINOPHEN 325 MG PO TABS
650.0000 mg | ORAL_TABLET | ORAL | Status: DC | PRN
  Administered 2014-09-17: 650 mg via ORAL
  Filled 2014-09-17: qty 2

## 2014-09-17 MED ORDER — CALCIUM ACETATE 667 MG PO CAPS
667.0000 mg | ORAL_CAPSULE | ORAL | Status: DC
Start: 2014-09-17 — End: 2014-09-17

## 2014-09-17 MED ORDER — DIPHENHYDRAMINE HCL 25 MG PO CAPS: 25.0000 mg | ORAL_CAPSULE | Freq: Four times a day (QID) | ORAL | Status: DC | PRN

## 2014-09-17 MED ORDER — ISOSORBIDE MONONITRATE ER 30 MG PO TB24: 30.0000 mg | ORAL_TABLET | Freq: Every day | ORAL | Status: DC

## 2014-09-17 MED ORDER — NITROGLYCERIN 0.4 MG SL SUBL
0.4000 mg | SUBLINGUAL_TABLET | SUBLINGUAL | Status: DC | PRN
Start: 2014-09-17 — End: 2014-09-19

## 2014-09-17 MED ORDER — METOCLOPRAMIDE HCL 5 MG PO TABS
5.0000 mg | ORAL_TABLET | Freq: Three times a day (TID) | ORAL | Status: DC
  Administered 2014-09-17 – 2014-09-18 (×4): 5 mg via ORAL
  Filled 2014-09-17 (×4): qty 1

## 2014-09-17 MED ORDER — INSULIN PUMP
Freq: Three times a day (TID) | SUBCUTANEOUS | Status: DC
  Administered 2014-09-17: 8 via SUBCUTANEOUS
  Administered 2014-09-18: 12.1 via SUBCUTANEOUS
  Administered 2014-09-18: 8.6 via SUBCUTANEOUS
  Filled 2014-09-17: qty 1

## 2014-09-17 MED ORDER — METOPROLOL SUCCINATE ER 25 MG PO TB24
12.5000 mg | ORAL_TABLET | Freq: Every day | ORAL | Status: DC
  Administered 2014-09-17 – 2014-09-18 (×2): 12.5 mg via ORAL
  Filled 2014-09-17 (×2): qty 1

## 2014-09-17 MED ORDER — LORATADINE 10 MG PO TABS
10.0000 mg | ORAL_TABLET | Freq: Every day | ORAL | Status: DC
  Administered 2014-09-17 – 2014-09-19 (×3): 10 mg via ORAL
  Filled 2014-09-17 (×3): qty 1

## 2014-09-17 MED ORDER — PRASUGREL HCL 10 MG PO TABS
10.0000 mg | ORAL_TABLET | Freq: Every day | ORAL | Status: DC
  Administered 2014-09-17 – 2014-09-19 (×3): 10 mg via ORAL
  Filled 2014-09-17 (×3): qty 1

## 2014-09-17 MED ORDER — ATORVASTATIN CALCIUM 10 MG PO TABS
10.0000 mg | ORAL_TABLET | Freq: Every evening | ORAL | Status: DC
  Administered 2014-09-17 – 2014-09-18 (×2): 10 mg via ORAL
  Filled 2014-09-17 (×2): qty 1

## 2014-09-17 MED ORDER — LEVOTHYROXINE SODIUM 100 MCG PO TABS
100.0000 ug | ORAL_TABLET | Freq: Every day | ORAL | Status: DC
  Administered 2014-09-18: 100 ug via ORAL
  Filled 2014-09-17: qty 1

## 2014-09-17 MED ORDER — ISOSORBIDE MONONITRATE ER 30 MG PO TB24
30.0000 mg | ORAL_TABLET | Freq: Two times a day (BID) | ORAL | Status: DC
  Administered 2014-09-17: 30 mg via ORAL
  Filled 2014-09-17: qty 1

## 2014-09-17 MED ORDER — INSULIN ASPART 100 UNIT/ML ~~LOC~~ SOLN: 0.0000 [IU] | Freq: Three times a day (TID) | SUBCUTANEOUS | Status: DC

## 2014-09-17 MED ORDER — SODIUM CHLORIDE 0.9 % IV SOLN
INTRAVENOUS | Status: DC
  Administered 2014-09-17: 10 mL/h via INTRAVENOUS

## 2014-09-17 MED ORDER — RENA-VITE PO TABS
1.0000 | ORAL_TABLET | Freq: Every day | ORAL | Status: DC
  Administered 2014-09-17 – 2014-09-18 (×2): 1 via ORAL
  Filled 2014-09-17 (×2): qty 1

## 2014-09-17 NOTE — ED Notes (Signed)
Patient checked his sugar. CBG 244. Patient administered 11 units of Insulin per pump.

## 2014-09-17 NOTE — ED Notes (Signed)
Patient is returned from [place].

## 2014-09-17 NOTE — ED Notes (Addendum)
Pt states he was feeling nauseous today before Dialysis and generally weak. After his dialysis treatment he began to have substernal chest pressure and feeling lightheaded. Pt appears pale and diaphoretic on arrival. 1 nitro tablet, 324 ASA and 4mg  zofran given by ems. Pt c/o of chest pain 4/10. Pt has insulin pump  cbg 183.

## 2014-09-17 NOTE — ED Notes (Signed)
PT placed in gown and in bed. Pt monitored by pulse ox, bp cuff, and 12-lead. 

## 2014-09-17 NOTE — ED Notes (Signed)
Appt. At 3:40

## 2014-09-17 NOTE — Progress Notes (Signed)
  Received page from RN regarding pt's insulin pump. Orders are Novolog sensitive tidwc and hs. Pt has insulin pump on. Will be NPO after MN for procedure in the am. Instructed RN to get insulin pump orders from MD. Will need to discontinue Novolog orders. Also spoke with pt regarding his pump settings. There are a couple of changes since his last admission.  Correction Factor 12am - 4pm - 1:20    4pm - 12am - 1:25 Target blood sugar -  120 Insulin to CHO ratio -  12am - 4pm - 1:7    4pm - 12am - 1:8 Basal rates -  12 am  - 1.5    3am - 1.75    8am - 1.45    12pm - 1.625  Pt very knowledgeable of insulin pump and would like to stay on pump. Spoke with RN again, after this Coordinator spoke with pt. RN has paged MD to get insulin pump orders.  Thank you. Lorenda Peck, RD, LDN, CDE Inpatient Diabetes Coordinator [phone]

## 2014-09-17 NOTE — ED Provider Notes (Signed)
CSN: [id number]     Arrival date & time 09/17/14  1228 History   First MD Initiated Contact with Patient 09/17/14 1234     Chief Complaint  Patient presents with  . Chest Pain     (Consider location/radiation/quality/duration/timing/severity/associated sxs/prior Treatment) HPI Comments: Patient here complaining of acute onset of substernal chest pain which began after he was taken off of oxygen while being dialyzed today. Patient started talking was a heavy pressure without radiation. Was more dyspneic but denies any diaphoresis. EMS was called and patient given nitroglycerin which greatly relieved his discomfort. Does have a history of CAD with a coronary artery stent placed last year. Denies any recent exertional angina. Is currently pain-free at this time and was transported by EMS.  Patient is a 49 y.o. male presenting with chest pain. The history is provided by the patient.  Chest Pain   Past Medical History  Diagnosis Date  . Hypertension   . History of blood transfusion     "related to dialysis"  . Dyspnea     a. 07/23/11 Echo: EF 55-60%  . Anemia of chronic disease   . Cholecystitis     a. 08/27/2011  . Constipation   . Pericardial effusion     a.  Small by CT 08/22/11;  b.  Large by CT 08/27/11  . Inguinal lymphadenopathy     a. bilateral - s/p biopsy 07/2011  . Fibromyalgia   . Hx of echocardiogram     Echo (11/15):  Mild LVH, EF 60-65%, no RWMA, Gr 1 DD, MAC, mild LAE, normal RVF, mild RAE  . Hx of cardiovascular stress test     Lexiscan Myoview (11/15):  Inf, apical cap and apical lateral ischemia, EF 56%, inf HK, Moderate Risk  . High cholesterol   . Pneumonia ~ 2007  . OSA (obstructive sleep apnea)     "I'm not doing the mask; claustrophobic"  . Claustrophobia   . Hypothyroidism   . Insulin dependent diabetes mellitus   . GERD (gastroesophageal reflux disease)   . Arthritis     "hands, right knee" (03/19/2014)  . ESRD (end stage renal disease)     a. 1995 s/p  cadaveric transplant w/ susbequent failure after 18 yrs;  b.Dialysis initiated 07/2011  . CAD (coronary artery disease)     a. LHC (11/15):  mid to dist Dx 80%, mid RCA 99% (functional CTO), dist RCA 80% >> PCI: balloon angioplasty to mid RCA (could not deliver stent) - plan staged PCI with HSRA of RCA; Dx to be tx medically    Past Surgical History  Procedure Laterality Date  . Kidney transplant  04/25/1993  . Cataract extraction w/ intraocular lens  implant, bilateral Bilateral 1990's  . Arteriovenous graft placement Left 1993?    forearm  . Arteriovenous graft placement Right 1993?    leg  . Toe amputation Bilateral after 2005  . Below knee leg amputation Left 2010  . Carpal tunnel release Bilateral after 2005  . Shoulder arthroscopy Left ~ 2006    frozen  . Av fistula placement  07/29/2011    Procedure: ARTERIOVENOUS (AV) FISTULA CREATION;  Surgeon: Mal Misty, MD;  Location: [facility];  Service: Vascular;  Laterality: Right;  Brachial cephalic  . Insertion of dialysis catheter  08/01/2011    Procedure: INSERTION OF DIALYSIS CATHETER;  Surgeon: Rosetta Posner, MD;  Location: [facility];  Service: Vascular;  Laterality: Right;  insertion of dialysis catheter on right internal jugular vein  .  Lymph node biopsy  08/10/2011    Procedure: LYMPH NODE BIOPSY;  Surgeon: Merrie Roof, MD;  Location: [facility];  Service: General;  Laterality: Left;  left groin lymph biopsy  . Colonoscopy  08/29/2011    Procedure: COLONOSCOPY;  Surgeon: Jerene Bears, MD;  Location: [facility];  Service: Gastroenterology;  Laterality: N/A;  . Appendectomy  ~ 2006  . Hernia repair    . Umbilical hernia repair  1990's  . Cardiac catheterization  03/19/2014  . Av fistula placement Left 1993?    "attempted one in my wrist; didn't take"  . Neuroplasty / transposition ulnar nerve at elbow Left after 2005  . Venogram Right 07/28/2011    Procedure: VENOGRAM;  Surgeon: Conrad Lyford, MD;  Location: [facility] CATH LAB;  Service:  Cardiovascular;  Laterality: Right;  . Left heart catheterization with coronary angiogram N/A 03/19/2014    Procedure: LEFT HEART CATHETERIZATION WITH CORONARY ANGIOGRAM;  Surgeon: Burnell Blanks, MD;  Location: [facility] CATH LAB;  Service: Cardiovascular;  Laterality: N/A;  . Cardiac catheterization  03/19/2014    Procedure: CORONARY BALLOON ANGIOPLASTY;  Surgeon: Burnell Blanks, MD;  Location: [facility] CATH LAB;  Service: Cardiovascular;;  . Coronary angioplasty with stent placement  04/09/2014        ptca/des mid lad   . Angioplasty  04/09/2014    RCA   . Femoral artery repair  04/09/2014    ANGIOSEAL  . Eye surgery    . Percutaneous coronary rotoblator intervention (pci-r) N/A 04/09/2014    Procedure: PERCUTANEOUS CORONARY ROTOBLATOR INTERVENTION (PCI-R);  Surgeon: Burnell Blanks, MD;  Location: [facility] CATH LAB;  Service: Cardiovascular;  Laterality: N/A;   Family History  Problem Relation Age of Onset  . Malignant hyperthermia Neg Hx   . Colon polyps Father   . Diabetes Father   . Cancer Father   . Breast cancer Maternal Aunt   . Cancer Maternal Aunt     Breast and Bone  . Heart attack Neg Hx   . Stroke Neg Hx   . Hypertension Mother   . Hypertension Father   . Hypertension Father    History  Substance Use Topics  . Smoking status: Never Smoker   . Smokeless tobacco: Never Used  . Alcohol Use: Yes     Comment: 03/19/2014 "might have a drink 2-3 times/year"    Review of Systems  Cardiovascular: Positive for chest pain.  All other systems reviewed and are negative.     Allergies  Doxycycline; Hydrocodone; Morphine and related; Amoxicillin; Ciprofloxacin; Clindamycin/lincomycin; Codeine; Levofloxacin; Vasotec; Contrast media; Oxycodone; and Plavix  Home Medications   Prior to Admission medications   Medication Sig Start Date End Date Taking? Authorizing Provider  allopurinol (ZYLOPRIM) 100 MG tablet Take 200 mg by mouth daily.  03/14/12   Historical  Provider, MD  aspirin 81 MG tablet Take 81 mg by mouth every evening.     Historical Provider, MD  atorvastatin (LIPITOR) 10 MG tablet Take 10 mg by mouth every evening.     Historical Provider, MD  calcium acetate (PHOSLO) 667 MG capsule Take 1,334-2,001 mg by mouth See admin instructions. Take 2,001 mg (3 capsules) with meals and 1,334 mg (2 capsules) with snacks 09/19/12   Historical Provider, MD  cephALEXin (KEFLEX) 250 MG capsule Take 1 capsule (250 mg total) by mouth every 12 (twelve) hours. 04/21/14   Burnell Blanks, MD  clopidogrel (PLAVIX) 75 MG tablet  03/20/14   Historical Provider, MD  diphenhydrAMINE (BENADRYL) 25 mg capsule Take 1-2 capsules (25-50 mg total) by mouth every 6 (six) hours as needed for itching. 04/11/14   Rhonda G Barrett, PA-C  docusate sodium (COLACE) 100 MG capsule Take 200 mg by mouth 3 (three) times daily.    Historical Provider, MD  famotidine (PEPCID) 20 MG tablet Take 1 tablet (20 mg total) by mouth daily. Take daily for 5 days, then resume Zantac 04/11/14   Rhonda G Barrett, PA-C  furosemide (LASIX) 80 MG tablet Take 2 tablets (160 mg total) by mouth 2 (two) times daily. OK to take if you are still urinating and it is helpful. 04/11/14   Rhonda G Barrett, PA-C  GLUCAGON EMERGENCY 1 MG injection Inject 1 mg into the muscle daily as needed (low blood sugar).  01/19/12   Historical Provider, MD  HYDROmorphone (DILAUDID) 2 MG tablet Take 2 mg by mouth 3 (three) times daily as needed for severe pain.  09/03/12   Historical Provider, MD  hydrOXYzine (ATARAX/VISTARIL) 25 MG tablet  03/30/14   Historical Provider, MD  hydrOXYzine (VISTARIL) 25 MG capsule Take 25 mg by mouth 3 (three) times daily as needed for itching.    Historical Provider, MD  Insulin Human (INSULIN PUMP) 100 unit/ml SOLN Inject into the skin continuous. Humalog    Historical Provider, MD  isosorbide mononitrate (IMDUR) 30 MG 24 hr tablet Take 1 tablet (30 mg total) by mouth daily. 03/06/14   Liliane Shi, PA-C  levothyroxine (SYNTHROID, LEVOTHROID) 100 MCG tablet Take 100 mcg by mouth daily before breakfast.    Historical Provider, MD  loratadine (CLARITIN) 10 MG tablet Take 10 mg by mouth daily.    Historical Provider, MD  metoCLOPramide (REGLAN) 5 MG tablet Take 5 mg by mouth 4 (four) times daily - after meals and at bedtime. 05/26/11   Historical Provider, MD  metoprolol succinate (TOPROL-XL) 25 MG 24 hr tablet Take 0.5 tablets (12.5 mg total) by mouth daily. 03/20/14   Liliane Shi, PA-C  nitroGLYCERIN (NITROSTAT) 0.4 MG SL tablet Place 0.4 mg under the tongue every 5 (five) minutes as needed for chest pain.     [name], MD  prasugrel (EFFIENT) 10 MG TABS tablet Take 1 tablet (10 mg total) by mouth daily. 04/03/14   Liliane Shi, PA-C  predniSONE (DELTASONE) 20 MG tablet Take 2 tablets (40 mg total) by mouth daily with breakfast. 2 tabs daily x 4 days, 1 tab daily x 4 days, 1/2 tab daily x 4 days and stop 04/11/14   Evelene Croon Barrett, PA-C  promethazine (PHENERGAN) 25 MG tablet Take 25 mg by mouth Every 6 hours as needed for nausea or vomiting. For nausea 07/13/11   Historical Provider, MD  ranitidine (ZANTAC) 150 MG tablet Take 1 tablet (150 mg total) by mouth 2 (two) times daily. HOLD for 5 days while on Pepcid (famotidine). 04/11/14   Rhonda G Barrett, PA-C  rOPINIRole (REQUIP) 2 MG tablet Take 2 mg by mouth daily with supper.     Historical Provider, MD   BP 122/60 mmHg  Pulse 85  Temp(Src) 98.4 F (36.9 C) (Oral)  Resp 19  SpO2 100% Physical Exam  Constitutional: He is oriented to person, place, and time. He appears well-developed and well-nourished.  Non-toxic appearance. No distress.  HENT:  Head: Normocephalic and atraumatic.  Eyes: Conjunctivae, EOM and lids are normal. Pupils are equal, round, and reactive to light.  Neck: Normal range of motion. Neck supple. No tracheal deviation  present. No thyroid mass present.  Cardiovascular: Normal rate, regular rhythm  and normal heart sounds.  Exam reveals no gallop.   No murmur heard. Pulmonary/Chest: Effort normal and breath sounds normal. No stridor. No respiratory distress. He has no decreased breath sounds. He has no wheezes. He has no rhonchi. He has no rales.  Abdominal: Soft. Normal appearance and bowel sounds are normal. He exhibits no distension. There is no tenderness. There is no rebound and no CVA tenderness.  Musculoskeletal: Normal range of motion. He exhibits no edema or tenderness.  Neurological: He is alert and oriented to person, place, and time. He has normal strength. No cranial nerve deficit or sensory deficit. GCS eye subscore is 4. GCS verbal subscore is 5. GCS motor subscore is 6.  Skin: Skin is warm and dry. No abrasion and no rash noted.  Psychiatric: He has a normal mood and affect. His speech is normal and behavior is normal.  Nursing note and vitals reviewed.   ED Course  Procedures (including critical care time) Labs Review Labs Reviewed  CBC WITH DIFFERENTIAL/PLATELET  BASIC METABOLIC PANEL  TROPONIN I    Imaging Review No results found.   EKG Interpretation   Date/Time:  Wednesday Sep 17 2014 12:31:04 EDT Ventricular Rate:  82 PR Interval:  129 QRS Duration: 92 QT Interval:  395 QTC Calculation: 461 R Axis:   69 Text Interpretation:  Sinus rhythm No significant change since last  tracing Confirmed by Maigan Bittinger  MD, Kinisha Soper ([id number]) on 09/17/2014 12:42:59 PM      MDM   Final diagnoses:  Chest pain    Patient to be admitted for evaluation of chest pain   Lacretia Leigh, MD 09/17/14 1505

## 2014-09-17 NOTE — Consult Note (Signed)
CARDIOLOGY CONSULT NOTE   Patient ID: Dale [name] MRN: [id number] DOB/AGE: 08-25-1965 49 y.o.  Admit date: 09/17/2014  Primary Physician   Dale Kalata, MD Primary Cardiologist   Dr Dale [name] Reason for Consultation   Chest pain  [id number] [name] is a 49 y.o. year old male with a history of CAD, HTN, DM, OSA, ESRD on HD, Anemia, peric effusion, L BKA, and HL. Last PCI 03/2014. Last office visit 04/2014, doing well from a cardiac standpoint.   For the last 2-3 weeks, he has been having low BP post-HD at home. SBP prior to HD about 160. After HD, it would be 102, he would feel light-headed.  Today, SBP was 120 pre-HD, and during HD, it was running low during treatment. He completed HD but they had to keep giving him saline. Dry weight is 125.5 kg, weight in hospital is 129.3 kg after HD.   He developed chest pain, pressure and tightness at a 5/10 after he completed HD and they took him off O2. Never had it before. Says his previous CAD sx was SOB. EMS was called and gave him ASA 81 mg x 4 and SL NTG x 1. The pain improved and is down to a 1-2/10. His BP is normal and he denies any other symptoms.    Past Medical History  Diagnosis Date  . Hypertension   . History of blood transfusion     "related to dialysis"  . Dyspnea     a. 07/23/11 Echo: EF 55-60%  . Anemia of chronic disease   . Cholecystitis     a. 08/27/2011  . Constipation   . Pericardial effusion     a.  Small by CT 08/22/11;  b.  Large by CT 08/27/11  . Inguinal lymphadenopathy     a. bilateral - s/p biopsy 07/2011  . Fibromyalgia   . Hx of echocardiogram     Echo (11/15):  Mild LVH, EF 60-65%, no RWMA, Gr 1 DD, MAC, mild LAE, normal RVF, mild RAE  . Hx of cardiovascular stress test     Lexiscan Myoview (11/15):  Inf, apical cap and apical lateral ischemia, EF 56%, inf HK, Moderate Risk  . High cholesterol   . Pneumonia ~ 2007  . OSA (obstructive sleep apnea)     "I'm not doing the mask; claustrophobic"  .  Claustrophobia   . Hypothyroidism   . Insulin dependent diabetes mellitus   . GERD (gastroesophageal reflux disease)   . Arthritis     "hands, right knee" (03/19/2014)  . ESRD (end stage renal disease)     a. 1995 s/p cadaveric transplant w/ susbequent failure after 18 yrs;  b.Dialysis initiated 07/2011  . CAD (coronary artery disease)     a. LHC (11/15):  mid to dist Dx 80%, mid RCA 99% (functional CTO), dist RCA 80% >> PCI: balloon angioplasty to mid RCA (could not deliver stent) - plan staged PCI with HSRA of RCA; Dx to be tx medically      Past Surgical History  Procedure Laterality Date  . Kidney transplant  04/25/1993  . Cataract extraction w/ intraocular lens  implant, bilateral Bilateral 1990's  . Arteriovenous graft placement Left 1993?    forearm  . Arteriovenous graft placement Right 1993?    leg  . Toe amputation Bilateral after 2005  . Below knee leg amputation Left 2010  . Carpal tunnel release Bilateral after 2005  . Shoulder arthroscopy Left ~ 2006  frozen  . Av fistula placement  07/29/2011    Procedure: ARTERIOVENOUS (AV) FISTULA CREATION;  Surgeon: Mal Misty, MD;  Location: [place];  Service: Vascular;  Laterality: Right;  Brachial cephalic  . Insertion of dialysis catheter  08/01/2011    Procedure: INSERTION OF DIALYSIS CATHETER;  Surgeon: Rosetta Posner, MD;  Location: [place];  Service: Vascular;  Laterality: Right;  insertion of dialysis catheter on right internal jugular vein  . Lymph node biopsy  08/10/2011    Procedure: LYMPH NODE BIOPSY;  Surgeon: Merrie Roof, MD;  Location: [place];  Service: General;  Laterality: Left;  left groin lymph biopsy  . Colonoscopy  08/29/2011    Procedure: COLONOSCOPY;  Surgeon: Jerene Bears, MD;  Location: [place];  Service: Gastroenterology;  Laterality: N/A;  . Appendectomy  ~ 2006  . Hernia repair    . Umbilical hernia repair  1990's  . Cardiac catheterization  03/19/2014  . Av fistula placement Left 1993?    "attempted  one in my wrist; didn't take"  . Neuroplasty / transposition ulnar nerve at elbow Left after 2005  . Venogram Right 07/28/2011    Procedure: VENOGRAM;  Surgeon: Conrad Enterprise, MD;  Location: [facility] CATH LAB;  Service: Cardiovascular;  Laterality: Right;  . Left heart catheterization with coronary angiogram N/A 03/19/2014    Procedure: LEFT HEART CATHETERIZATION WITH CORONARY ANGIOGRAM;  Surgeon: Burnell Blanks, MD;  Location: [facility] CATH LAB;  Service: Cardiovascular;  Laterality: N/A;  . Cardiac catheterization  03/19/2014    Procedure: CORONARY BALLOON ANGIOPLASTY;  Surgeon: Burnell Blanks, MD;  Location: [facility] CATH LAB;  Service: Cardiovascular;;  . Coronary angioplasty with stent placement  04/09/2014        ptca/des mid lad   . Angioplasty  04/09/2014    RCA   . Femoral artery repair  04/09/2014    ANGIOSEAL  . Eye surgery    . Percutaneous coronary rotoblator intervention (pci-r) N/A 04/09/2014    Procedure: PERCUTANEOUS CORONARY ROTOBLATOR INTERVENTION (PCI-R);  Surgeon: Burnell Blanks, MD;  Location: [facility] CATH LAB;  Service: Cardiovascular;  Laterality: N/A;    Allergies  Allergen Reactions  . Doxycycline     RASH  . Hydrocodone     RASH  . Morphine And Related Hives and Nausea Only  . Amoxicillin Other (See Comments)    Reaction unknown  . Ciprofloxacin Other (See Comments)    Reaction unknown  . Clindamycin/Lincomycin Other (See Comments)    Reaction unknown  . Codeine Other (See Comments)    Reaction unknown. But pt has tolerated hydrocodone in Vicodin and Norco in the past  . Levofloxacin Other (See Comments)    Reaction unknown  . Vasotec Cough  . Contrast Media [Iodinated Diagnostic Agents] Rash  . Oxycodone Rash  . Plavix [Clopidogrel Bisulfate] Rash    I have reviewed the patient's current medications . allopurinol  200 mg Oral Daily  . aspirin  81 mg Oral QPM  . atorvastatin  10 mg Oral QPM  . calcium acetate  667-1,334 mg Oral See admin  instructions  . enoxaparin (LOVENOX) injection  30 mg Subcutaneous Q24H  . famotidine  20 mg Oral Daily  . isosorbide mononitrate  30 mg Oral Daily  . [START ON 09/18/2014] levothyroxine  100 mcg Oral QAC breakfast  . loratadine  10 mg Oral Daily  . metoCLOPramide  5 mg Oral TID PC & HS  . metoprolol succinate  12.5 mg Oral Daily  .  multivitamin  1 tablet Oral Daily  . prasugrel  10 mg Oral Daily  . rOPINIRole  2 mg Oral Q supper  . sevelamer carbonate  1,600 mg Oral TID WC   . sodium chloride 10 mL/hr (09/17/14 1436)  . insulin pump     acetaminophen, diphenhydrAMINE, docusate sodium, HYDROmorphone (DILAUDID) injection, HYDROmorphone, nitroGLYCERIN, ondansetron (ZOFRAN) IV  Medication Sig  allopurinol (ZYLOPRIM) 100 MG tablet Take 200 mg by mouth daily.   aspirin 81 MG tablet Take 81 mg by mouth every evening.   atorvastatin (LIPITOR) 10 MG tablet Take 10 mg by mouth every evening.   calcium acetate (PHOSLO) 667 MG capsule Take 667-1,334 mg by mouth See admin instructions. Take 1,334 mg (2 capsules) with meals and 667 (1 capsule) with snacks  diphenhydrAMINE (BENADRYL) 25 mg capsule Take 1-2 capsules (25-50 mg total) by mouth every 6 (six) hours as needed for itching.  docusate sodium (COLACE) 100 MG capsule Take 200 mg by mouth 3 (three) times daily as needed for mild constipation.   furosemide (LASIX) 80 MG tablet Take 2 tablets (160 mg total) by mouth 2 (two) times daily. OK to take if you are still urinating and it is helpful.  GLUCAGON EMERGENCY 1 MG injection Inject 1 mg into the muscle daily as needed (low blood sugar).   HYDROmorphone (DILAUDID) 2 MG tablet Take 2 mg by mouth 3 (three) times daily as needed for severe pain.   Insulin Human (INSULIN PUMP) 100 unit/ml SOLN Inject into the skin continuous. Humalog  isosorbide mononitrate (IMDUR) 30 MG 24 hr tablet Take 1 tablet (30 mg total) by mouth daily.  levothyroxine (SYNTHROID, LEVOTHROID) 100 MCG tablet Take 100 mcg by mouth  daily before breakfast.  loratadine (CLARITIN) 10 MG tablet Take 10 mg by mouth daily.  metoCLOPramide (REGLAN) 5 MG tablet Take 5 mg by mouth 4 (four) times daily - after meals and at bedtime.  metoprolol succinate (TOPROL-XL) 25 MG 24 hr tablet Take 0.5 tablets (12.5 mg total) by mouth daily.  multivitamin (RENA-VIT) TABS tablet Take 1 tablet by mouth daily.  nitroGLYCERIN (NITROSTAT) 0.4 MG SL tablet Place 0.4 mg under the tongue every 5 (five) minutes as needed for chest pain.   prasugrel (EFFIENT) 10 MG TABS tablet Take 1 tablet (10 mg total) by mouth daily.  promethazine (PHENERGAN) 25 MG tablet Take 25 mg by mouth Every 6 hours as needed for nausea or vomiting. For nausea  ranitidine (ZANTAC) 150 MG tablet Take 1 tablet (150 mg total) by mouth 2 (two) times daily. HOLD for 5 days while on Pepcid (famotidine).  rOPINIRole (REQUIP) 2 MG tablet Take 2 mg by mouth daily with supper.   sevelamer carbonate (RENVELA) 800 MG tablet Take 1,600 mg by mouth 3 (three) times daily with meals.     History   Social History  . Marital Status: Married    Spouse Name: N/A  . Number of Children: N/A  . Years of Education: N/A   Occupational History  . Disabled    Social History Main Topics  . Smoking status: Never Smoker   . Smokeless tobacco: Never Used  . Alcohol Use: Yes     Comment: 03/19/2014 "might have a drink 2-3 times/year"  . Drug Use: No  . Sexual Activity: Not Currently   Other Topics Concern  . Not on file   Social History Narrative   Lives @ home with his wife in [place].     Family Status  Relation Status Death Age  .  Father Alive   . Mother Alive    Family History  Problem Relation Age of Onset  . Malignant hyperthermia Neg Hx   . Colon polyps Father   . Diabetes Father   . Cancer Father   . Breast cancer Maternal Aunt   . Cancer Maternal Aunt     Breast and Bone  . Heart attack Neg Hx   . Stroke Neg Hx   . Hypertension Mother   . Hypertension Father     . Hypertension Father      ROS:  Full 14 point review of systems complete and found to be negative unless listed above.  Physical Exam: Blood pressure 111/64, pulse 80, temperature 98.4 F (36.9 C), temperature source Oral, resp. rate 17, SpO2 98 %.  General: Well developed, morbidly obese, male in no acute distress Head: Eyes PERRLA, No xanthomas.   Normocephalic and atraumatic, oropharynx without edema or exudate. Dentition: good Lungs: CTA bilaterally Heart: HRRR S1 S2, no rub/gallop, no murmur. pulses are 2+ LUE and RLE, HD access RUE with decreased pulses and s/p L BKA.   Neck: No carotid bruits. No lymphadenopathy.  JVD not noted elevated but difficult to assess 2nd body habitus. Abdomen: Bowel sounds present, abdomen soft and non-tender without masses or hernias noted. Msk:  No spine or cva tenderness. No weakness, no joint deformities or effusions. Extremities: No clubbing or cyanosis. No RLE edema.  Neuro: Alert and oriented X 3. No focal deficits noted. Psych:  Good affect, responds appropriately Skin: No rashes or lesions noted.  Labs:   Lab Results  Component Value Date   WBC 8.0 09/17/2014   HGB 10.7* 09/17/2014   HCT 33.2* 09/17/2014   MCV 104.1* 09/17/2014   PLT 219 09/17/2014    Recent Labs Lab 09/17/14 1303  NA 132*  K 4.1  CL 93*  CO2 28  BUN 30*  CREATININE 5.24*  CALCIUM 8.5*  GLUCOSE 225*   MAGNESIUM  Date Value Ref Range Status  07/07/2012 2.3 1.5 - 2.5 mg/dL Final    Recent Labs  09/17/14 1303  TROPONINI <0.03   Echo: 02/25/2014 Study Conclusions - Left ventricle: The cavity size was normal. Wall thickness was increased in a pattern of mild LVH. Systolic function was normal. The estimated ejection fraction was in the range of 60% to 65%. Wall motion was normal; there were no regional wall motion abnormalities. Doppler parameters are consistent with abnormal left ventricular relaxation (grade 1 diastolic dysfunction). -  Aortic valve: There was no stenosis. - Mitral valve: Mildly calcified annulus. There was trivial regurgitation. - Left atrium: The atrium was mildly dilated. - Right ventricle: The cavity size was normal. Systolic function was normal. - Right atrium: The atrium was mildly dilated. - Pulmonary arteries: No complete TR doppler jet so unable to estimate PA systolic pressure. - Systemic veins: IVC measured 2.3 cm with > 50% respirophasic variation, suggesting RA pressure 8 mmHg. Impressions: - Normal LV size with mild LV hypertrophy. EF 60-65%. Normal RV size and systolic function. No significant valvular abnormalities.  ECG:  SR, no acute ischemic changes, minor lateral T wave differences, not likely significant.  Radiology:  Dg Chest 2 View 09/17/2014   CLINICAL DATA:  Nauseous prior to dialysis with a weakness today. Substernal chest pressure and lightheadedness.  EXAM: CHEST  2 VIEW  COMPARISON:  05/13/2014  FINDINGS: Lungs are adequately inflated without consolidation or effusion. Mild stable cardiomegaly. Degenerative changes of the spine.  IMPRESSION: No active cardiopulmonary disease.  Stable cardiomegaly.   Electronically Signed   By: Marin Olp M.D.   On: 09/17/2014 14:21    ASSESSMENT AND PLAN:   The patient was seen today by Dr Dale [name], the patient evaluated and the data reviewed.  Principal Problem:   Chest pain - not his usual angina - associated with low BP in the setting of HD - hx CAD and improved w/ NTG - still has at a 1-2/10 - continue home rx, but increase Imdur to 30 mg BID.  - continue to cycle enzymes - MD to eval in am and decide on cath  Echo in am  Otherwise, per IM Active Problems:   History of renal transplant   ESRD (end stage renal disease)   HTN (hypertension)   CAD (coronary artery disease)   Signed: Lenoard Aden 09/17/2014 3:54 PM Beeper [id number]  I have personally seen and examined this patient with Rosaria Ferries,  NP. I agree with the assessment and plan as outlined above. He is known to have obstructive CAD with last cath in December 2015. His RCA was heavily calcified and I treated with a rotablator atherectomy with stent placement in the mid RCA and balloon angioplasty alone of the distal RCA. The Diagonal had mid to distal disease but was managed medically. Now admitted with dizziness, hypotension and chest pain during HD. Chest pain has mostly resolved with administration of NTG SL. Troponin is negative x 1. EKG is unchanged. This does not appear to be ACS. Would cycle cardiac markers and check echo. If he rules in for an MI, will need repeat cath. If cardiac markers are negative, will have to consider what type of ischemic evaluation we should arrange. It is concerning that he has been getting hypotensive with HD for last few weeks. It seems that his dry weight is up due to inability to pull off fluid with HD and this may be leading to vascular congestion resulting in chest pressure. I will see him again tomorrow and discuss further.   Omir Cooprider 09/17/2014 4:45 PM

## 2014-09-17 NOTE — ED Notes (Signed)
MD made aware of patient's pain.  

## 2014-09-17 NOTE — H&P (Addendum)
History and Physical        Hospital Admission Note Date: 09/17/2014  Patient name: Dale [name] Medical record number: [id number] Date of birth: 10-Oct-1965 Age: 49 y.o. Gender: male  PCP: Windy Kalata, MD  Referring physician: Dr Zenia Resides  Chief Complaint:  Chest pain  HPI: Patient is a 49 year old male with ESRD on hemodialysis MWF, left BKA, hypertension, diabetes on insulin pump, sleep apnea, morbid obesity, CAD with PCI of RCA with DES, 12/15 presented to ED with chest pain. History was obtained from the patient who reported that he was in his normal state of health until this morning. He went for his usual dialysis, felt that his BP was slightly lower than his usual numbers of SBP 150-160's pre-dialysis. During the dialysis he started having chest heaviness, left-sided with nausea and shortness of breath. He denied any diaphoresis, vomiting, any radiation of the chest pain. He described the episode of chest pain as "weight on my chest", improved with aspirin and 1 nitroglycerin by the EMS. At the time of my examination the chest heaviness has improved significantly. He reports no recent chest pain episodes with exertion. EDP note reviewed, troponin negative so far, no EKG changes suggestive of ischemia  Review of Systems:  Constitutional: Denies fever, chills, diaphoresis, poor appetite and fatigue.  HEENT: Denies photophobia, eye pain, redness, hearing loss, ear pain, congestion, sore throat, rhinorrhea, sneezing, mouth sores, trouble swallowing, neck pain, neck stiffness and tinnitus.   Respiratory: Denies any coughing or wheezing Cardiovascular: Please see history of present illness Gastrointestinal: Denies nausea, vomiting, abdominal pain, diarrhea, constipation, blood in stool and abdominal distention.  Genitourinary: Denies dysuria, urgency, frequency, hematuria,  flank pain and difficulty urinating.  Musculoskeletal: Denies myalgias, back pain, joint swelling, arthralgias and gait problem.  Skin: Denies pallor, rash and wound.  Neurological: Denies dizziness, seizures, syncope, weakness, light-headedness, numbness and headaches.  Hematological: Denies adenopathy. Easy bruising, personal or family bleeding history  Psychiatric/Behavioral: Denies suicidal ideation, mood changes, confusion, nervousness, sleep disturbance and agitation  Past Medical History: Past Medical History  Diagnosis Date  . Hypertension   . History of blood transfusion     "related to dialysis"  . Dyspnea     a. 07/23/11 Echo: EF 55-60%  . Anemia of chronic disease   . Cholecystitis     a. 08/27/2011  . Constipation   . Pericardial effusion     a.  Small by CT 08/22/11;  b.  Large by CT 08/27/11  . Inguinal lymphadenopathy     a. bilateral - s/p biopsy 07/2011  . Fibromyalgia   . Hx of echocardiogram     Echo (11/15):  Mild LVH, EF 60-65%, no RWMA, Gr 1 DD, MAC, mild LAE, normal RVF, mild RAE  . Hx of cardiovascular stress test     Lexiscan Myoview (11/15):  Inf, apical cap and apical lateral ischemia, EF 56%, inf HK, Moderate Risk  . High cholesterol   . Pneumonia ~ 2007  . OSA (obstructive sleep apnea)     "I'm not doing the mask; claustrophobic"  . Claustrophobia   . Hypothyroidism   . Insulin dependent diabetes mellitus   . GERD (gastroesophageal reflux  disease)   . Arthritis     "hands, right knee" (03/19/2014)  . ESRD (end stage renal disease)     a. 1995 s/p cadaveric transplant w/ susbequent failure after 18 yrs;  b.Dialysis initiated 07/2011  . CAD (coronary artery disease)     a. LHC (11/15):  mid to dist Dx 80%, mid RCA 99% (functional CTO), dist RCA 80% >> PCI: balloon angioplasty to mid RCA (could not deliver stent) - plan staged PCI with HSRA of RCA; Dx to be tx medically     Past Surgical History  Procedure Laterality Date  . Kidney transplant  04/25/1993   . Cataract extraction w/ intraocular lens  implant, bilateral Bilateral 1990's  . Arteriovenous graft placement Left 1993?    forearm  . Arteriovenous graft placement Right 1993?    leg  . Toe amputation Bilateral after 2005  . Below knee leg amputation Left 2010  . Carpal tunnel release Bilateral after 2005  . Shoulder arthroscopy Left ~ 2006    frozen  . Av fistula placement  07/29/2011    Procedure: ARTERIOVENOUS (AV) FISTULA CREATION;  Surgeon: Mal Misty, MD;  Location: [place];  Service: Vascular;  Laterality: Right;  Brachial cephalic  . Insertion of dialysis catheter  08/01/2011    Procedure: INSERTION OF DIALYSIS CATHETER;  Surgeon: Rosetta Posner, MD;  Location: [place];  Service: Vascular;  Laterality: Right;  insertion of dialysis catheter on right internal jugular vein  . Lymph node biopsy  08/10/2011    Procedure: LYMPH NODE BIOPSY;  Surgeon: Merrie Roof, MD;  Location: [place];  Service: General;  Laterality: Left;  left groin lymph biopsy  . Colonoscopy  08/29/2011    Procedure: COLONOSCOPY;  Surgeon: Jerene Bears, MD;  Location: [place];  Service: Gastroenterology;  Laterality: N/A;  . Appendectomy  ~ 2006  . Hernia repair    . Umbilical hernia repair  1990's  . Cardiac catheterization  03/19/2014  . Av fistula placement Left 1993?    "attempted one in my wrist; didn't take"  . Neuroplasty / transposition ulnar nerve at elbow Left after 2005  . Venogram Right 07/28/2011    Procedure: VENOGRAM;  Surgeon: Conrad Astatula, MD;  Location: [facility] CATH LAB;  Service: Cardiovascular;  Laterality: Right;  . Left heart catheterization with coronary angiogram N/A 03/19/2014    Procedure: LEFT HEART CATHETERIZATION WITH CORONARY ANGIOGRAM;  Surgeon: Burnell Blanks, MD;  Location: [facility] CATH LAB;  Service: Cardiovascular;  Laterality: N/A;  . Cardiac catheterization  03/19/2014    Procedure: CORONARY BALLOON ANGIOPLASTY;  Surgeon: Burnell Blanks, MD;  Location: [facility] CATH LAB;   Service: Cardiovascular;;  . Coronary angioplasty with stent placement  04/09/2014        ptca/des mid lad   . Angioplasty  04/09/2014    RCA   . Femoral artery repair  04/09/2014    ANGIOSEAL  . Eye surgery    . Percutaneous coronary rotoblator intervention (pci-r) N/A 04/09/2014    Procedure: PERCUTANEOUS CORONARY ROTOBLATOR INTERVENTION (PCI-R);  Surgeon: Burnell Blanks, MD;  Location: [facility] CATH LAB;  Service: Cardiovascular;  Laterality: N/A;    Medications: Prior to Admission medications   Medication Sig Start Date End Date Taking? Authorizing Provider  allopurinol (ZYLOPRIM) 100 MG tablet Take 200 mg by mouth daily.  03/14/12  Yes Historical Provider, MD  aspirin 81 MG tablet Take 81 mg by mouth every evening.    Yes Historical Provider, MD  atorvastatin (LIPITOR) 10 MG tablet Take 10 mg by mouth every evening.    Yes Historical Provider, MD  calcium acetate (PHOSLO) 667 MG capsule Take 667-1,334 mg by mouth See admin instructions. Take 1,334 mg (2 capsules) with meals and 667 (1 capsule) with snacks 09/19/12  Yes Historical Provider, MD  diphenhydrAMINE (BENADRYL) 25 mg capsule Take 1-2 capsules (25-50 mg total) by mouth every 6 (six) hours as needed for itching. 04/11/14  Yes Rhonda G Barrett, PA-C  docusate sodium (COLACE) 100 MG capsule Take 200 mg by mouth 3 (three) times daily as needed for mild constipation.    Yes Historical Provider, MD  furosemide (LASIX) 80 MG tablet Take 2 tablets (160 mg total) by mouth 2 (two) times daily. OK to take if you are still urinating and it is helpful. 04/11/14  Yes Rhonda G Barrett, PA-C  GLUCAGON EMERGENCY 1 MG injection Inject 1 mg into the muscle daily as needed (low blood sugar).  01/19/12  Yes Historical Provider, MD  HYDROmorphone (DILAUDID) 2 MG tablet Take 2 mg by mouth 3 (three) times daily as needed for severe pain.  09/03/12  Yes Historical Provider, MD  Insulin Human (INSULIN PUMP) 100 unit/ml SOLN Inject into the skin continuous.  Humalog   Yes Historical Provider, MD  isosorbide mononitrate (IMDUR) 30 MG 24 hr tablet Take 1 tablet (30 mg total) by mouth daily. 03/06/14  Yes Scott Joylene Draft, PA-C  levothyroxine (SYNTHROID, LEVOTHROID) 100 MCG tablet Take 100 mcg by mouth daily before breakfast.   Yes Historical Provider, MD  loratadine (CLARITIN) 10 MG tablet Take 10 mg by mouth daily.   Yes Historical Provider, MD  metoCLOPramide (REGLAN) 5 MG tablet Take 5 mg by mouth 4 (four) times daily - after meals and at bedtime. 05/26/11  Yes Historical Provider, MD  metoprolol succinate (TOPROL-XL) 25 MG 24 hr tablet Take 0.5 tablets (12.5 mg total) by mouth daily. 03/20/14  Yes Liliane Shi, PA-C  multivitamin (RENA-VIT) TABS tablet Take 1 tablet by mouth daily.   Yes Historical Provider, MD  nitroGLYCERIN (NITROSTAT) 0.4 MG SL tablet Place 0.4 mg under the tongue every 5 (five) minutes as needed for chest pain.    Yes [name], MD  prasugrel (EFFIENT) 10 MG TABS tablet Take 1 tablet (10 mg total) by mouth daily. 04/03/14  Yes Scott T Kathlen Mody, PA-C  promethazine (PHENERGAN) 25 MG tablet Take 25 mg by mouth Every 6 hours as needed for nausea or vomiting. For nausea 07/13/11  Yes Historical Provider, MD  ranitidine (ZANTAC) 150 MG tablet Take 1 tablet (150 mg total) by mouth 2 (two) times daily. HOLD for 5 days while on Pepcid (famotidine). 04/11/14  Yes Rhonda G Barrett, PA-C  rOPINIRole (REQUIP) 2 MG tablet Take 2 mg by mouth daily with supper.    Yes Historical Provider, MD  sevelamer carbonate (RENVELA) 800 MG tablet Take 1,600 mg by mouth 3 (three) times daily with meals.   Yes Historical Provider, MD    Allergies:   Allergies  Allergen Reactions  . Doxycycline     RASH  . Hydrocodone     RASH  . Morphine And Related Hives and Nausea Only  . Amoxicillin Other (See Comments)    Reaction unknown  . Ciprofloxacin Other (See Comments)    Reaction unknown  . Clindamycin/Lincomycin Other (See Comments)    Reaction  unknown  . Codeine Other (See Comments)    Reaction unknown. But pt has tolerated hydrocodone in Vicodin and  Norco in the past  . Levofloxacin Other (See Comments)    Reaction unknown  . Vasotec Cough  . Contrast Media [Iodinated Diagnostic Agents] Rash  . Oxycodone Rash  . Plavix [Clopidogrel Bisulfate] Rash    Social History:  reports that he has never smoked. He has never used smokeless tobacco. He reports that he drinks alcohol. He reports that he does not use illicit drugs.  Family History: Reviewed with the patient Family History  Problem Relation Age of Onset  . Malignant hyperthermia Neg Hx   . Colon polyps Father   . Diabetes Father   . Cancer Father   . Breast cancer Maternal Aunt   . Cancer Maternal Aunt     Breast and Bone  . Heart attack Neg Hx   . Stroke Neg Hx   . Hypertension Mother   . Hypertension Father   . Hypertension Father     Physical Exam: Blood pressure 106/51, pulse 83, temperature 98.4 F (36.9 C), temperature source Oral, resp. rate 18, SpO2 100 %. General: Alert, awake, oriented x3, in no acute distress. HEENT: normocephalic, atraumatic, anicteric sclera, pink conjunctiva, pupils equal and reactive to light and accomodation, oropharynx clear Neck: supple, no masses or lymphadenopathy, no goiter, no bruits  Heart: Regular rate and rhythm, without murmurs, rubs or gallops. Lungs: Clear to auscultation bilaterally, no wheezing, rales or rhonchi. No chest wall tenderness Abdomen: Morbidly obese Soft, nontender, nondistended, positive bowel sounds, no masses. Extremities: Left BKA Neuro: Grossly intact, no focal neurological deficits, strength 5/5 upper and lower extremities on the right, left BKA Psych: alert and oriented x 3, normal mood and affect Skin: no rashes or lesions, warm and dry   LABS on Admission:  Basic Metabolic Panel:  Recent Labs Lab 09/17/14 1303  NA 132*  K 4.1  CL 93*  CO2 28  GLUCOSE 225*  BUN 30*  CREATININE  5.24*  CALCIUM 8.5*   Liver Function Tests: No results for input(s): AST, ALT, ALKPHOS, BILITOT, PROT, ALBUMIN in the last 168 hours. No results for input(s): LIPASE, AMYLASE in the last 168 hours. No results for input(s): AMMONIA in the last 168 hours. CBC:  Recent Labs Lab 09/17/14 1303  WBC 8.0  NEUTROABS 5.6  HGB 10.7*  HCT 33.2*  MCV 104.1*  PLT 219   Cardiac Enzymes:  Recent Labs Lab 09/17/14 1303  TROPONINI <0.03   BNP: Invalid input(s): POCBNP CBG: No results for input(s): GLUCAP in the last 168 hours.  Radiological Exams on Admission:  Dg Chest 2 View  09/17/2014   CLINICAL DATA:  Nauseous prior to dialysis with a weakness today. Substernal chest pressure and lightheadedness.  EXAM: CHEST  2 VIEW  COMPARISON:  05/13/2014  FINDINGS: Lungs are adequately inflated without consolidation or effusion. Mild stable cardiomegaly. Degenerative changes of the spine.  IMPRESSION: No active cardiopulmonary disease.  Stable cardiomegaly.   Electronically Signed   By: Marin Olp M.D.   On: 09/17/2014 14:21    *I have personally reviewed the images above*  EKG: Independently reviewed. Rate 82, no acute ST-T wave changes suggestive of ischemia  Assessment/Plan  Principal Problem:   Chest pain: Multiple risk factors including recent CAD with PCI in 03/2014 to RCA, hypertension, diabetes mellitus, ESRD - Admit to telemetry, obtain serial cardiac enzymes, currently chest pain improved, continue aspirin, beta blocker, Imdur, monitor BP closely - For now continue carb modified diet, NPO after midnight, cardiology consult called  Active Problems: Diabetes mellitus type 2:  -  Patient has insulin pump, will consult diabetic coordinator. However, if stress test is decided by cardiology, he will have to be NPO after MN, in that case, will place on sliding scale insulin. Will have to wait for cardiology recommendations.    ESRD (end stage renal disease) on HD - On hemodialysis,  patient had dialysis today - If patient is discharged tomorrow (pending cardiology evaluation), he will not need dialysis as inpatient. Nephrology will be notified, if needs HD inpatient.     HTN (hypertension) -Continue beta blocker, Imdur     CAD (coronary artery disease)PCI in 03/2014 to RCA, - Continue ASA, statin, Imdur, beta blocker, effient   hypothyroidism  - Continue Synthroid   DVT prophylaxis:  Lovenox   CODE STATUS:  Full code status  Family Communication: Admission, patients condition and plan of care including tests being ordered have been discussed with the patient and family member who indicates understanding and agree with the plan and Code Status  Disposition plan: Further plan will depend as patient's clinical course evolves and further radiologic and laboratory data become available. Likely home when cleared by cardiology  Time Spent on Admission: 60 mins   Dominga Mcduffie M.D. [facility] 09/17/2014, 3:31 PM Pager: [phone]  If 7PM-7AM, please contact night-coverage [email or web address] Password [email or web address]

## 2014-09-18 ENCOUNTER — Observation Stay (HOSPITAL_BASED_OUTPATIENT_CLINIC_OR_DEPARTMENT_OTHER): Payer: Medicare Other

## 2014-09-18 DIAGNOSIS — N186 End stage renal disease: Secondary | ICD-10-CM | POA: Diagnosis not present

## 2014-09-18 DIAGNOSIS — I25119 Atherosclerotic heart disease of native coronary artery with unspecified angina pectoris: Secondary | ICD-10-CM | POA: Diagnosis not present

## 2014-09-18 DIAGNOSIS — R072 Precordial pain: Secondary | ICD-10-CM | POA: Diagnosis not present

## 2014-09-18 DIAGNOSIS — I2510 Atherosclerotic heart disease of native coronary artery without angina pectoris: Secondary | ICD-10-CM | POA: Diagnosis not present

## 2014-09-18 DIAGNOSIS — I10 Essential (primary) hypertension: Secondary | ICD-10-CM | POA: Diagnosis not present

## 2014-09-18 LAB — BASIC METABOLIC PANEL
Anion gap: 12 (ref 5–15)
BUN: 49 mg/dL — ABNORMAL HIGH (ref 6–20)
CO2: 28 mmol/L (ref 22–32)
Calcium: 9 mg/dL (ref 8.9–10.3)
Chloride: 93 mmol/L — ABNORMAL LOW (ref 101–111)
Creatinine, Ser: 8.09 mg/dL — ABNORMAL HIGH (ref 0.61–1.24)
GFR calc Af Amer: 8 mL/min — ABNORMAL LOW (ref >60)
GFR calc non Af Amer: 7 mL/min — ABNORMAL LOW (ref >60)
Glucose, Bld: 185 mg/dL — ABNORMAL HIGH (ref 65–99)
Potassium: 5.3 mmol/L — ABNORMAL HIGH (ref 3.5–5.1)
Sodium: 133 mmol/L — ABNORMAL LOW (ref 135–145)

## 2014-09-18 LAB — TROPONIN I: Troponin I: <0.03 ng/mL (ref <0.031)

## 2014-09-18 LAB — HEPATIC FUNCTION PANEL
ALT: 17 U/L (ref 17–63)
AST: 18 U/L (ref 15–41)
Albumin: 3.1 g/dL — ABNORMAL LOW (ref 3.5–5.0)
Alkaline Phosphatase: 84 U/L (ref 38–126)
Bilirubin, Direct: <0.1 mg/dL — ABNORMAL LOW (ref 0.1–0.5)
Total Bilirubin: 0.5 mg/dL (ref 0.3–1.2)
Total Protein: 7 g/dL (ref 6.5–8.1)

## 2014-09-18 LAB — GLUCOSE, CAPILLARY
Glucose-Capillary: 149 mg/dL — ABNORMAL HIGH (ref 65–99)
Glucose-Capillary: 168 mg/dL — ABNORMAL HIGH (ref 65–99)
Glucose-Capillary: 111 mg/dL — ABNORMAL HIGH (ref 65–99)
Glucose-Capillary: 117 mg/dL — ABNORMAL HIGH (ref 65–99)

## 2014-09-18 LAB — HEMOGLOBIN A1C
Hgb A1c MFr Bld: 7.2 % — ABNORMAL HIGH (ref 4.8–5.6)
Mean Plasma Glucose: 160 mg/dL

## 2014-09-18 MED ORDER — PERFLUTREN LIPID MICROSPHERE
1.0000 mL | INTRAVENOUS | Status: AC | PRN
  Administered 2014-09-18: 1 mL via INTRAVENOUS
  Filled 2014-09-18: qty 10

## 2014-09-18 MED ORDER — HYDROMORPHONE HCL 2 MG PO TABS
2.0000 mg | ORAL_TABLET | Freq: Four times a day (QID) | ORAL | Status: DC | PRN
  Administered 2014-09-18: 2 mg via ORAL
  Filled 2014-09-18 (×2): qty 1

## 2014-09-18 MED ORDER — DOXERCALCIFEROL 4 MCG/2ML IV SOLN
3.0000 ug | INTRAVENOUS | Status: DC
  Filled 2014-09-18: qty 2

## 2014-09-18 MED ORDER — ISOSORBIDE MONONITRATE ER 30 MG PO TB24
30.0000 mg | ORAL_TABLET | Freq: Every day | ORAL | Status: DC
  Administered 2014-09-18 – 2014-09-19 (×2): 30 mg via ORAL
  Filled 2014-09-18 (×2): qty 1

## 2014-09-18 MED ORDER — PROMETHAZINE HCL 25 MG/ML IJ SOLN
12.5000 mg | Freq: Once | INTRAMUSCULAR | Status: AC
  Administered 2014-09-18: 12.5 mg via INTRAVENOUS
  Filled 2014-09-18: qty 1

## 2014-09-18 NOTE — Consult Note (Signed)
[facility] Renal Consultation Note    Indication for Consultation:  Management of ESRD/hemodialysis; anemia, hypertension/volume and secondary hyperparathyroidism PCP: Windy Kalata, MD  HPI: Dale [name] is a 49 y.o. male with ESRD R/T HTN/DM who has HD M,W,F at [facility].  PMH significant for HTN, DM, ESRD, OSA, morbid obesity, failed kidney transplant.    [name] states he has had issues with reaching EDW/Low BPs for past 2 weeks. 09/17/2014 his SBP initially was in 120s and the dropped to 70s.  He states he was out of UF for majority of [place].  After treatment, he developed chest pressure "heaviness" across upper chest", with nausea and SOB.  Denied diaphoresis, vomiting, syncope.  EMS was called.  Patient received NTG SL and ASA with relief of symptoms.  States he has no further chest pain.  Was admitted to [facility] for observation to R/O ACS.   He says his EDW has been raised but not by much  Past Medical History  Diagnosis Date  . Hypertension   . History of blood transfusion     "related to dialysis"  . Dyspnea     a. 07/23/11 Echo: EF 55-60%  . Anemia of chronic disease   . Cholecystitis     a. 08/27/2011  . Constipation   . Pericardial effusion     a.  Small by CT 08/22/11;  b.  Large by CT 08/27/11  . Inguinal lymphadenopathy     a. bilateral - s/p biopsy 07/2011  . Fibromyalgia   . Hx of echocardiogram     Echo (11/15):  Mild LVH, EF 60-65%, no RWMA, Gr 1 DD, MAC, mild LAE, normal RVF, mild RAE  . Hx of cardiovascular stress test     Lexiscan Myoview (11/15):  Inf, apical cap and apical lateral ischemia, EF 56%, inf HK, Moderate Risk  . High cholesterol   . Pneumonia ~ 2007  . OSA (obstructive sleep apnea)     "I'm not doing the mask; claustrophobic"  . Claustrophobia   . Hypothyroidism   . Insulin dependent diabetes mellitus   . GERD (gastroesophageal reflux disease)   . Arthritis     "hands, right knee" (03/19/2014)  . ESRD (end stage renal disease)      a. 1995 s/p cadaveric transplant w/ susbequent failure after 18 yrs;  b.Dialysis initiated 07/2011  . CAD (coronary artery disease)     a. LHC (11/15):  mid to dist Dx 80%, mid RCA 99% (functional CTO), dist RCA 80% >> PCI: balloon angioplasty to mid RCA (could not deliver stent) - plan staged PCI with HSRA of RCA; Dx to be tx medically    Past Surgical History  Procedure Laterality Date  . Kidney transplant  04/25/1993  . Cataract extraction w/ intraocular lens  implant, bilateral Bilateral 1990's  . Arteriovenous graft placement Left 1993?    forearm  . Arteriovenous graft placement Right 1993?    leg  . Toe amputation Bilateral after 2005  . Below knee leg amputation Left 2010  . Carpal tunnel release Bilateral after 2005  . Shoulder arthroscopy Left ~ 2006    frozen  . Av fistula placement  07/29/2011    Procedure: ARTERIOVENOUS (AV) FISTULA CREATION;  Surgeon: Mal Misty, MD;  Location: [place];  Service: Vascular;  Laterality: Right;  Brachial cephalic  . Insertion of dialysis catheter  08/01/2011    Procedure: INSERTION OF DIALYSIS CATHETER;  Surgeon: Rosetta Posner, MD;  Location: [place];  Service: Vascular;  Laterality: Right;  insertion of dialysis catheter on right internal jugular vein  . Lymph node biopsy  08/10/2011    Procedure: LYMPH NODE BIOPSY;  Surgeon: Merrie Roof, MD;  Location: [place];  Service: General;  Laterality: Left;  left groin lymph biopsy  . Colonoscopy  08/29/2011    Procedure: COLONOSCOPY;  Surgeon: Jerene Bears, MD;  Location: [place];  Service: Gastroenterology;  Laterality: N/A;  . Appendectomy  ~ 2006  . Hernia repair    . Umbilical hernia repair  1990's  . Cardiac catheterization  03/19/2014  . Av fistula placement Left 1993?    "attempted one in my wrist; didn't take"  . Neuroplasty / transposition ulnar nerve at elbow Left after 2005  . Venogram Right 07/28/2011    Procedure: VENOGRAM;  Surgeon: Conrad Virginville, MD;  Location: [facility] CATH LAB;  Service:  Cardiovascular;  Laterality: Right;  . Left heart catheterization with coronary angiogram N/A 03/19/2014    Procedure: LEFT HEART CATHETERIZATION WITH CORONARY ANGIOGRAM;  Surgeon: Burnell Blanks, MD;  Location: [facility] CATH LAB;  Service: Cardiovascular;  Laterality: N/A;  . Cardiac catheterization  03/19/2014    Procedure: CORONARY BALLOON ANGIOPLASTY;  Surgeon: Burnell Blanks, MD;  Location: [facility] C[facility] LAB;  Service: Cardiovascular;;  . Coronary angioplasty with stent placement  04/09/2014        ptca/des mid lad   . Angioplasty  04/09/2014    RCA   . Femoral artery repair  04/09/2014    ANGIOSEAL  . Eye surgery    . Percutaneous coronary rotoblator intervention (pci-r) N/A 04/09/2014    Procedure: PERCUTANEOUS CORONARY ROTOBLATOR INTERVENTION (PCI-R);  Surgeon: Burnell Blanks, MD;  Location: [facility] C[facility] LAB;  Service: Cardiovascular;  Laterality: N/A;   Family History  Problem Relation Age of Onset  . Malignant hyperthermia Neg Hx   . Colon polyps Father   . Diabetes Father   . Cancer Father   . Breast cancer Maternal Aunt   . Cancer Maternal Aunt     Breast and Bone  . Heart attack Neg Hx   . Stroke Neg Hx   . Hypertension Mother   . Hypertension Father   . Hypertension Father    Social History:  reports that he has never smoked. He has never used smokeless tobacco. He reports that he drinks alcohol. He reports that he does not use illicit drugs. Allergies  Allergen Reactions  . Doxycycline     RASH  . Hydrocodone     RASH  . Morphine And Related Hives and Nausea Only  . Amoxicillin Other (See Comments)    Reaction unknown  . Ciprofloxacin Other (See Comments)    Reaction unknown  . Clindamycin/Lincomycin Other (See Comments)    Reaction unknown  . Codeine Other (See Comments)    Reaction unknown. But pt has tolerated hydrocodone in Vicodin and Norco in the past  . Levofloxacin Other (See Comments)    Reaction unknown  . Vasotec Cough  . Contrast  Media [Iodinated Diagnostic Agents] Rash  . Oxycodone Rash  . Plavix [Clopidogrel Bisulfate] Rash   Prior to Admission medications   Medication Sig Start Date End Date Taking? Authorizing Provider  allopurinol (ZYLOPRIM) 100 MG tablet Take 200 mg by mouth daily.  03/14/12  Yes Historical Provider, MD  aspirin 81 MG tablet Take 81 mg by mouth every evening.    Yes Historical Provider, MD  atorvastatin (LIPITOR) 10 MG tablet Take 10 mg  by mouth every evening.    Yes Historical Provider, MD  calcium acetate (PHOSLO) 667 MG capsule Take 667-1,334 mg by mouth See admin instructions. Take 1,334 mg (2 capsules) with meals and 667 (1 capsule) with snacks 09/19/12  Yes Historical Provider, MD  diphenhydrAMINE (BENADRYL) 25 mg capsule Take 1-2 capsules (25-50 mg total) by mouth every 6 (six) hours as needed for itching. 04/11/14  Yes Rhonda G Barrett, PA-C  docusate sodium (COLACE) 100 MG capsule Take 200 mg by mouth 3 (three) times daily as needed for mild constipation.    Yes Historical Provider, MD  furosemide (LASIX) 80 MG tablet Take 2 tablets (160 mg total) by mouth 2 (two) times daily. OK to take if you are still urinating and it is helpful. 04/11/14  Yes Rhonda G Barrett, PA-C  GLUCAGON EMERGENCY 1 MG injection Inject 1 mg into the muscle daily as needed (low blood sugar).  01/19/12  Yes Historical Provider, MD  HYDROmorphone (DILAUDID) 2 MG tablet Take 2 mg by mouth 3 (three) times daily as needed for severe pain.  09/03/12  Yes Historical Provider, MD  Insulin Human (INSULIN PUMP) 100 unit/ml SOLN Inject into the skin continuous. Humalog   Yes Historical Provider, MD  isosorbide mononitrate (IMDUR) 30 MG 24 hr tablet Take 1 tablet (30 mg total) by mouth daily. 03/06/14  Yes Scott Joylene Draft, PA-C  levothyroxine (SYNTHROID, LEVOTHROID) 100 MCG tablet Take 100 mcg by mouth daily before breakfast.   Yes Historical Provider, MD  loratadine (CLARITIN) 10 MG tablet Take 10 mg by mouth daily.   Yes Historical  Provider, MD  metoCLOPramide (REGLAN) 5 MG tablet Take 5 mg by mouth 4 (four) times daily - after meals and at bedtime. 05/26/11  Yes Historical Provider, MD  metoprolol succinate (TOPROL-XL) 25 MG 24 hr tablet Take 0.5 tablets (12.5 mg total) by mouth daily. 03/20/14  Yes Liliane Shi, PA-C  multivitamin (RENA-VIT) TABS tablet Take 1 tablet by mouth daily.   Yes Historical Provider, MD  nitroGLYCERIN (NITROSTAT) 0.4 MG SL tablet Place 0.4 mg under the tongue every 5 (five) minutes as needed for chest pain.    Yes [name], MD  prasugrel (EFFIENT) 10 MG TABS tablet Take 1 tablet (10 mg total) by mouth daily. 04/03/14  Yes Scott T Kathlen Mody, PA-C  promethazine (PHENERGAN) 25 MG tablet Take 25 mg by mouth Every 6 hours as needed for nausea or vomiting. For nausea 07/13/11  Yes Historical Provider, MD  ranitidine (ZANTAC) 150 MG tablet Take 1 tablet (150 mg total) by mouth 2 (two) times daily. HOLD for 5 days while on Pepcid (famotidine). 04/11/14  Yes Rhonda G Barrett, PA-C  rOPINIRole (REQUIP) 2 MG tablet Take 2 mg by mouth daily with supper.    Yes Historical Provider, MD  sevelamer carbonate (RENVELA) 800 MG tablet Take 1,600 mg by mouth 3 (three) times daily with meals.   Yes Historical Provider, MD   Current Facility-Administered Medications  Medication Dose Route Frequency Provider Last Rate Last Dose  . 0.9 %  sodium chloride infusion   Intravenous Continuous Lacretia Leigh, MD 10 mL/hr at 09/17/14 1436 10 mL/hr at 09/17/14 1436  . acetaminophen (TYLENOL) tablet 650 mg  650 mg Oral Q4H PRN [name], MD   650 mg at 09/17/14 2208  . allopurinol (ZYLOPRIM) tablet 200 mg  200 mg Oral Daily [name], MD   200 mg at 09/17/14 1715  . aspirin EC tablet 81 mg  81  mg Oral Daily [name], MD   81 mg at 09/17/14 1716  . atorvastatin (LIPITOR) tablet 10 mg  10 mg Oral QPM [name], MD   10 mg at 09/17/14 1715  . calcium acetate (PHOSLO) capsule 1,334 mg  1,334 mg Oral TID WC  [name], MD   1,334 mg at 09/17/14 1715  . calcium acetate (PHOSLO) capsule 667 mg  667 mg Oral Daily PRN [name], MD      . diphenhydrAMINE (BENADRYL) capsule 25-50 mg  25-50 mg Oral Q6H PRN [name], MD      . docusate sodium (COLACE) capsule 200 mg  200 mg Oral TID PRN [name], MD      . enoxaparin (LOVENOX) injection 30 mg  30 mg Subcutaneous Q24H [name], MD   30 mg at 09/17/14 1716  . famotidine (PEPCID) tablet 20 mg  20 mg Oral Daily [name], MD   20 mg at 09/17/14 1715  . HYDROmorphone (DILAUDID) injection 1 mg  1 mg Intravenous Q6H PRN [name], MD   1 mg at 09/17/14 2327  . HYDROmorphone (DILAUDID) tablet 2 mg  2 mg Oral Q6H PRN Belkys A Regalado, MD      . insulin pump   Subcutaneous TID AC, HS, 0200 [name], MD   8 each at 09/17/14 1840  . isosorbide mononitrate (IMDUR) 24 hr tablet 30 mg  30 mg Oral Daily Belkys A Regalado, MD      . levothyroxine (SYNTHROID, LEVOTHROID) tablet 100 mcg  100 mcg Oral QAC breakfast [name], MD      . loratadine (CLARITIN) tablet 10 mg  10 mg Oral Daily [name], MD   10 mg at 09/17/14 1715  . metoCLOPramide (REGLAN) tablet 5 mg  5 mg Oral TID PC & HS [name], MD   5 mg at 09/17/14 2208  . metoprolol succinate (TOPROL-XL) 24 hr tablet 12.5 mg  12.5 mg Oral q1800 [name], MD   12.5 mg at 09/17/14 1716  . multivitamin (RENA-VIT) tablet 1 tablet  1 tablet Oral QHS [name], MD   1 tablet at 09/17/14 2208  . nitroGLYCERIN (NITROSTAT) SL tablet 0.4 mg  0.4 mg Sublingual Q5 min PRN [name], MD      . ondansetron (ZOFRAN) injection 4 mg  4 mg Intravenous Q6H PRN [name], MD   4 mg at 09/18/14 0446  . perflutren lipid microspheres (DEFINITY) IV suspension  1-10 mL Intravenous PRN Belkys A Regalado, MD   1 mL at 09/18/14 0843  . prasugrel (EFFIENT) tablet 10 mg  10 mg Oral Daily [name], MD   10 mg at 09/17/14 1715  . rOPINIRole (REQUIP) tablet 2 mg  2 mg  Oral Q supper [name], MD   2 mg at 09/17/14 1714  . sevelamer carbonate (RENVELA) tablet 1,600 mg  1,600 mg Oral TID WC [name], MD   1,600 mg at 09/17/14 1716   Labs: Basic Metabolic Panel:  Recent Labs Lab 09/17/14 1303 09/17/14 1650  NA 132*  --   K 4.1  --   CL 93*  --   CO2 28  --   GLUCOSE 225*  --   BUN 30*  --   CREATININE 5.24* 6.18*  CALCIUM 8.5*  --    Liver Function Tests: No results for input(s): AST, ALT, ALKPHOS,  BILITOT, PROT, ALBUMIN in the last 168 hours. No results for input(s): LIPASE, AMYLASE in the last 168 hours. No results for input(s): AMMONIA in the last 168 hours. CBC:  Recent Labs Lab 09/17/14 1303 09/17/14 1650  WBC 8.0 7.1  NEUTROABS 5.6  --   HGB 10.7* 10.6*  HCT 33.2* 33.5*  MCV 104.1* 105.0*  PLT 219 228   Cardiac Enzymes:  Recent Labs Lab 09/17/14 1303 09/17/14 1650 09/17/14 2242  TROPONINI <0.03 <0.03 <0.03   CBG:  Recent Labs Lab 09/17/14 1615 09/17/14 2131 09/18/14 0727  GLUCAP 171* 122* 149*   Iron Studies: No results for input(s): IRON, TIBC, TRANSFERRIN, FERRITIN in the last 72 hours. Studies/Results: Dg Chest 2 View  09/17/2014   CLINICAL DATA:  Nauseous prior to dialysis with a weakness today. Substernal chest pressure and lightheadedness.  EXAM: CHEST  2 VIEW  COMPARISON:  05/13/2014  FINDINGS: Lungs are adequately inflated without consolidation or effusion. Mild stable cardiomegaly. Degenerative changes of the spine.  IMPRESSION: No active cardiopulmonary disease.  Stable cardiomegaly.   Electronically Signed   By: Marin Olp M.D.   On: 09/17/2014 14:21   Subjective:  Denies Chest Pain/SOB.  "I feel pretty good right now".  ROS: As per HPI otherwise negative.   Physical Exam: Filed Vitals:   09/17/14 2014 09/17/14 2212 09/18/14 0433 09/18/14 0612  BP: 102/57 123/64 106/58 107/54  Pulse: 83  79 74  Temp: 97.7 F (36.5 C)  98.1 F (36.7 C)   TempSrc: Oral  Oral   Resp: 18  18    Height:      Weight:   127.6 kg (281 lb 4.9 oz)   SpO2: 99% 96% 94% 96%     General: Well developed, well nourished, in no acute distress. Head: Normocephalic, atraumatic, sclera non-icteric, mucus membranes are moist Neck: Supple. JVD not elevated. Lungs: Clear bilaterally to auscultation without wheezes, rales, or rhonchi. Breathing is unlabored. Heart: RRR with S1 S2. No murmurs, rubs, or gallops appreciated. Abdomen: Obese, soft, non-tender, non-distended with normoactive bowel sounds. No rebound/guarding. No obvious abdominal masses. M-S:  Strength and tone appear normal for age. Lower extremities:without edema L BKA with prosthetic limb. Neuro: Alert and oriented X 3. Moves all extremities spontaneously. Psych:  Responds to questions appropriately with a normal affect. Dialysis Access:  Has RUA AVF.  Currently is awaiting appt at [facility] of occlusion of R. Innominate view.  +Thrill/+Bruit.  Dialysis Orders: Center: [place]  on M,W,F . EDW 125.5 HD [place] 2.0K 2.25 Ca 1 Mg  Time 4 hours Heparin 3700 units at beginning of [place]. Access RUA AVF BFR 400 DFR Manual 85ml/min    Venofer  50 mg IV weekly (last dose 5/25) Hectorol 3 mcg IV 3 X weekly (Last PTH 394 4/27) Mircera 75 mcg IV q 2 weeks (Last dose 5/11) Assessment/Plan: 1.  Chest Pain: Cardiology Consult.  Troponin negative. No cardiac cath planned. Echo pending 2.  ESRD -  M,W, F via AVF. Orders written for HD tomorrow.  3.  Hypotension/volume  - Patient was hypotensive and out of UF most of [place] 5/25.  Will increase EDW 126.5 and evaluate tolerance. Today, Last BP 107/54. Imdur dose reduced by cardiology to 30 mg daily.  4.  Anemia  - Hgb 10.6.  On mod ESA Dose.  5.  Metabolic bone disease -  Ca+ 8.5 On Renvela, taking as directed. PTH 394 (08/20/2014) 6.  Nutrition - Renal diet. Patient has insulin pump-will continue.  Rita H. Owens Shark, NP-C 09/18/2014, 9:52 AM  [employer] [phone]  Patient seen and  examined, agree with above note with above modifications. 49 year old ESRD patient with obesity and CAD- had episode with HD on Wednesday- BP low throughout then CP/dizziness after.  Fortunately CP resolved for now- cards on board- echo done- will plan on HD tomorrow with new higher EDW to see if tolerates better  Corliss Parish, MD 09/18/2014

## 2014-09-18 NOTE — Progress Notes (Signed)
Pt nauseous, this RN administered Zofran IV. Patient called again stating that he is still nauseous. Paged the on-call for [facility], see new one time order for Phenergan. 12.5 IV.

## 2014-09-18 NOTE — Progress Notes (Signed)
*  PRELIMINARY RESULTS* Echocardiogram 2D Echocardiogram with definity has been performed.  [name] 09/18/2014, 8:59 AM

## 2014-09-18 NOTE — Progress Notes (Signed)
[facility] PROGRESS NOTE  Dale [name] [id number] DOB: 02-19-1966 DOA: 09/17/2014 PCP: Windy Kalata, MD  Assessment/Plan: Chest pain:  Multiple risk factors including recent CAD with PCI in 03/2014 to RCA, hypertension, diabetes mellitus, ESRD -  continue aspirin, beta blocker, Imdur.  Troponin negative.  ECHO pending.   Diabetes mellitus type 2:  - Patient has insulin pump, will consult diabetic coordinator. -Continue with insulin Pump..   ESRD (end stage renal disease) on HD - On hemodialysis, patient had dialysis 5-25 - Nephrologist consulted, need to observed during dialysis treatment.  -follow ECHO, patient recently getting hypotensive during dialysis.    HTN (hypertension) -Continue beta blocker, Imdur home dose.    CAD (coronary artery disease)PCI in 03/2014 to RCA, - Continue ASA, statin, Imdur, beta blocker, effient  hypothyroidism  - Continue Synthroid   DVT prophylaxis: Lovenox   Code Status: Full Code.  Family Communication: Care discussed with patient.  Disposition Plan: awaiting evaluation for chest pain. Home probabl;y in 24 hours.    Consultants:  Cardiology  Nephrology  Procedures:  ECHO; pending.   Antibiotics:  none  HPI/Subjective: Complaining of back pain, chronic pain, gets worse when he is lying down.  Chest pain better.   Objective: Filed Vitals:   09/18/14 0612  BP: 107/54  Pulse: 74  Temp:   Resp:    No intake or output data in the 24 hours ending 09/18/14 0820 Filed Weights   09/17/14 1607 09/18/14 0433  Weight: 129.3 kg (285 lb 0.9 oz) 127.6 kg (281 lb 4.9 oz)    Exam:   General: Alert, NAD  Cardiovascular: S 1, S 2 RRR  Respiratory: CTA  Abdomen: Obese, distended, NT, NR  Musculoskeletal: left BKA.   Data Reviewed: Basic Metabolic Panel:  Recent Labs Lab 09/17/14 1303 09/17/14 1650  NA 132*  --   K 4.1  --   CL 93*  --   CO2 28  --   GLUCOSE 225*  --   BUN 30*  --    CREATININE 5.24* 6.18*  CALCIUM 8.5*  --    Liver Function Tests: No results for input(s): AST, ALT, ALKPHOS, BILITOT, PROT, ALBUMIN in the last 168 hours. No results for input(s): LIPASE, AMYLASE in the last 168 hours. No results for input(s): AMMONIA in the last 168 hours. CBC:  Recent Labs Lab 09/17/14 1303 09/17/14 1650  WBC 8.0 7.1  NEUTROABS 5.6  --   HGB 10.7* 10.6*  HCT 33.2* 33.5*  MCV 104.1* 105.0*  PLT 219 228   Cardiac Enzymes:  Recent Labs Lab 09/17/14 1303 09/17/14 1650 09/17/14 2242  TROPONINI <0.03 <0.03 <0.03   BNP (last 3 results) No results for input(s): BNP in the last 8760 hours.  ProBNP (last 3 results) No results for input(s): PROBNP in the last 8760 hours.  CBG:  Recent Labs Lab 09/17/14 1615 09/17/14 2131 09/18/14 0727  GLUCAP 171* 122* 149*    No results found for this or any previous visit (from the past 240 hour(s)).   Studies: Dg Chest 2 View  09/17/2014   CLINICAL DATA:  Nauseous prior to dialysis with a weakness today. Substernal chest pressure and lightheadedness.  EXAM: CHEST  2 VIEW  COMPARISON:  05/13/2014  FINDINGS: Lungs are adequately inflated without consolidation or effusion. Mild stable cardiomegaly. Degenerative changes of the spine.  IMPRESSION: No active cardiopulmonary disease.  Stable cardiomegaly.   Electronically Signed   By: Marin Olp M.D.   On: 09/17/2014 14:21  Scheduled Meds: . allopurinol  200 mg Oral Daily  . aspirin EC  81 mg Oral Daily  . atorvastatin  10 mg Oral QPM  . calcium acetate  1,334 mg Oral TID WC  . enoxaparin (LOVENOX) injection  30 mg Subcutaneous Q24H  . famotidine  20 mg Oral Daily  . insulin pump   Subcutaneous TID AC, HS, 0200  . isosorbide mononitrate  30 mg Oral BID  . levothyroxine  100 mcg Oral QAC breakfast  . loratadine  10 mg Oral Daily  . metoCLOPramide  5 mg Oral TID PC & HS  . metoprolol succinate  12.5 mg Oral q1800  . multivitamin  1 tablet Oral QHS  .  prasugrel  10 mg Oral Daily  . rOPINIRole  2 mg Oral Q supper  . sevelamer carbonate  1,600 mg Oral TID WC   Continuous Infusions: . sodium chloride 10 mL/hr (09/17/14 1436)    Principal Problem:   Chest pain Active Problems:   History of renal transplant   ESRD (end stage renal disease)   HTN (hypertension)   CAD (coronary artery disease)    Time spent: 35 minutes.     Niel Hummer A  [facility] Pager [phone]. If 7PM-7AM, please contact night-coverage at [email or web address], password [facility] 09/18/2014, 8:20 AM  LOS: 1 day

## 2014-09-18 NOTE — Progress Notes (Signed)
     SUBJECTIVE: No chest pain this am.   BP 107/54 mmHg  Pulse 74  Temp(Src) 98.1 F (36.7 C) (Oral)  Resp 18  Ht 5\' 8"  (1.727 m)  Wt 281 lb 4.9 oz (127.6 kg)  BMI 42.78 kg/m2  SpO2 96% No intake or output data in the 24 hours ending 09/18/14 0838  PHYSICAL EXAM General: Obese white male in no acute distress. Alert and oriented x 3.  Psych:  Good affect, responds appropriately Neck: Unable to assess JVD. No masses noted.  Lungs: Clear bilaterally with no wheezes or rhonci noted.  Heart: RRR with no murmurs noted. Abdomen: Bowel sounds are present. Soft, non-tender.  Extremities: No lower extremity edema. Left BKA.    LABS: Basic Metabolic Panel:  Recent Labs  09/17/14 1303 09/17/14 1650  NA 132*  --   K 4.1  --   CL 93*  --   CO2 28  --   GLUCOSE 225*  --   BUN 30*  --   CREATININE 5.24* 6.18*  CALCIUM 8.5*  --    CBC:  Recent Labs  09/17/14 1303 09/17/14 1650  WBC 8.0 7.1  NEUTROABS 5.6  --   HGB 10.7* 10.6*  HCT 33.2* 33.5*  MCV 104.1* 105.0*  PLT 219 228   Cardiac Enzymes:  Recent Labs  09/17/14 1303 09/17/14 1650 09/17/14 2242  TROPONINI <0.03 <0.03 <0.03    Current Meds: . allopurinol  200 mg Oral Daily  . aspirin EC  81 mg Oral Daily  . atorvastatin  10 mg Oral QPM  . calcium acetate  1,334 mg Oral TID WC  . enoxaparin (LOVENOX) injection  30 mg Subcutaneous Q24H  . famotidine  20 mg Oral Daily  . insulin pump   Subcutaneous TID AC, HS, 0200  . isosorbide mononitrate  30 mg Oral BID  . levothyroxine  100 mcg Oral QAC breakfast  . loratadine  10 mg Oral Daily  . metoCLOPramide  5 mg Oral TID PC & HS  . metoprolol succinate  12.5 mg Oral q1800  . multivitamin  1 tablet Oral QHS  . prasugrel  10 mg Oral Daily  . rOPINIRole  2 mg Oral Q supper  . sevelamer carbonate  1,600 mg Oral TID WC    ASSESSMENT AND PLAN:  1. CAD/chest pain: Chest pain resolved. NO evidence of ACS. Not clear why he is hypotensive on HD. Will lower Imdur to  30 mg daily. Keep on tele today. Follow up on echo today. HD here tomorrow and can see how his BP tolerates this.     2. ESRD: on HD, planned for tomorrow.   Dale [name]  5/26/20168:38 AM

## 2014-09-19 DIAGNOSIS — N186 End stage renal disease: Secondary | ICD-10-CM | POA: Diagnosis not present

## 2014-09-19 DIAGNOSIS — I2510 Atherosclerotic heart disease of native coronary artery without angina pectoris: Secondary | ICD-10-CM | POA: Diagnosis not present

## 2014-09-19 DIAGNOSIS — I25119 Atherosclerotic heart disease of native coronary artery with unspecified angina pectoris: Secondary | ICD-10-CM | POA: Diagnosis not present

## 2014-09-19 DIAGNOSIS — R072 Precordial pain: Secondary | ICD-10-CM | POA: Diagnosis not present

## 2014-09-19 LAB — GLUCOSE, CAPILLARY
Glucose-Capillary: 168 mg/dL — ABNORMAL HIGH (ref 65–99)
Glucose-Capillary: 137 mg/dL — ABNORMAL HIGH (ref 65–99)
Glucose-Capillary: 141 mg/dL — ABNORMAL HIGH (ref 65–99)
Glucose-Capillary: 101 mg/dL — ABNORMAL HIGH (ref 65–99)

## 2014-09-19 MED ORDER — PROMETHAZINE HCL 25 MG/ML IJ SOLN
12.5000 mg | INTRAMUSCULAR | Status: DC | PRN
  Administered 2014-09-19: 12.5 mg via INTRAVENOUS

## 2014-09-19 MED ORDER — DOXERCALCIFEROL 4 MCG/2ML IV SOLN
INTRAVENOUS | Status: DC
Start: 2014-09-19 — End: 2014-09-19
  Filled 2014-09-19: qty 2

## 2014-09-19 MED ORDER — PROMETHAZINE HCL 25 MG/ML IJ SOLN
INTRAMUSCULAR | Status: AC
  Filled 2014-09-19: qty 1

## 2014-09-19 MED ORDER — PROMETHAZINE HCL 25 MG PO TABS
ORAL_TABLET | ORAL | Status: AC
  Filled 2014-09-19: qty 1

## 2014-09-19 NOTE — Progress Notes (Signed)
     SUBJECTIVE: No chest pain.   BP 99/61 mmHg  Pulse 85  Temp(Src) 97.7 F (36.5 C) (Oral)  Resp 18  Ht 5\' 8"  (1.727 m)  Wt 287 lb 7.7 oz (130.4 kg)  BMI 43.72 kg/m2  SpO2 96%  Intake/Output Summary (Last 24 hours) at 09/19/14 1156 Last data filed at 09/19/14 0800  Gross per 24 hour  Intake    360 ml  Output      0 ml  Net    360 ml    PHYSICAL EXAM General: Well developed, well nourished, in no acute distress. Alert and oriented x 3.  Psych:  Good affect, responds appropriately Neck: No JVD. No masses noted.  Lungs: Clear bilaterally with no wheezes or rhonci noted.  Heart: RRR with no murmurs noted. Abdomen: Bowel sounds are present. Soft, non-tender.  Extremities: No lower extremity edema.   LABS: Basic Metabolic Panel:  Recent Labs  09/17/14 1303 09/17/14 1650 09/18/14 1035  NA 132*  --  133*  K 4.1  --  5.3*  CL 93*  --  93*  CO2 28  --  28  GLUCOSE 225*  --  185*  BUN 30*  --  49*  CREATININE 5.24* 6.18* 8.09*  CALCIUM 8.5*  --  9.0   CBC:  Recent Labs  09/17/14 1303 09/17/14 1650  WBC 8.0 7.1  NEUTROABS 5.6  --   HGB 10.7* 10.6*  HCT 33.2* 33.5*  MCV 104.1* 105.0*  PLT 219 228   Cardiac Enzymes:  Recent Labs  09/17/14 1303 09/17/14 1650 09/17/14 2242  TROPONINI <0.03 <0.03 <0.03    Current Meds: . allopurinol  200 mg Oral Daily  . aspirin EC  81 mg Oral Daily  . atorvastatin  10 mg Oral QPM  . calcium acetate  1,334 mg Oral TID WC  . doxercalciferol  3 mcg Intravenous Q M,W,F-HD  . enoxaparin (LOVENOX) injection  30 mg Subcutaneous Q24H  . famotidine  20 mg Oral Daily  . insulin pump   Subcutaneous TID AC, HS, 0200  . isosorbide mononitrate  30 mg Oral Daily  . levothyroxine  100 mcg Oral QAC breakfast  . loratadine  10 mg Oral Daily  . metoCLOPramide  5 mg Oral TID PC & HS  . metoprolol succinate  12.5 mg Oral q1800  . multivitamin  1 tablet Oral QHS  . prasugrel  10 mg Oral Daily  . rOPINIRole  2 mg Oral Q supper  .  sevelamer carbonate  1,600 mg Oral TID WC   Echo 09/18/14:  Left ventricle: The cavity size was normal. Wall thickness was increased in a pattern of severe LVH. Systolic function was normal. The estimated ejection fraction was in the range of 55% to 60%. Wall motion was normal; there were no regional wall motion abnormalities. Doppler parameters are consistent with abnormal left ventricular relaxation (grade 1 diastolic dysfunction). - Mitral valve: Calcified annulus. - Left atrium: The atrium was mildly dilated.  ASSESSMENT AND PLAN:  1. CAD/chest pain: Chest pain resolved. No evidence of ACS. Not clear why he is hypotensive on HD. I lowered Imdur to 30 mg daily. Echo with normal LV systolic function.    2. ESRD: on HD  Will sign off. No further cardiac workup.     Anne-Marie Genson  5/27/201611:56 AM

## 2014-09-19 NOTE — Procedures (Signed)
I was present at this dialysis session. I have reviewed the session itself and made appropriate changes.   Pt with N, occ V.  Troponins neg x3.  TTE w/o effusion or loss of EF.  Tentaive EDW 126.5kg, see if can keep BP up.  No further CP episodes.   Pearson Grippe  MD 09/19/2014, 8:22 AM

## 2014-09-19 NOTE — Progress Notes (Signed)
Pt was discharged and waiting on wife, came into hall  tried to sit in rolling chair outside room, chair moved and pt fell on bottom.  Rt are hit dinamap and two 2 inch skin shears pressure applied and covered with dressing.  Pt states no injuries to other areas.  MD notified and no new orders, pt d/c to home with spouse.

## 2014-09-19 NOTE — Discharge Summary (Signed)
Physician Discharge Summary  Dale [name] [id number] DOB: 1966-03-10 DOA: 09/17/2014  PCP: Windy Kalata, MD  Admit date: 09/17/2014 Discharge date: 09/19/2014  Time spent: 35 minutes  Recommendations for Outpatient Follow-up:  Needs to follow up with nephrology, adjust medications as needed.   Discharge Diagnoses:   Chest pain related to hypotension   History of renal transplant   ESRD (end stage renal disease)   HTN (hypertension)   CAD (coronary artery disease)   Discharge Condition: Stable.   Diet recommendation: renal diet, carb modified.   Filed Weights   09/19/14 0500 09/19/14 0759 09/19/14 1214  Weight: 133.403 kg (294 lb 1.6 oz) 130.4 kg (287 lb 7.7 oz) 126 kg (277 lb 12.5 oz)    History of present illness:  Patient is a 49 year old male with ESRD on hemodialysis MWF, left BKA, hypertension, diabetes on insulin pump, sleep apnea, morbid obesity, CAD with PCI of RCA with DES, 12/15 presented to ED with chest pain. History was obtained from the patient who reported that he was in his normal state of health until this morning. He went for his usual dialysis, felt that his BP was slightly lower than his usual numbers of SBP 150-160's pre-dialysis. During the dialysis he started having chest heaviness, left-sided with nausea and shortness of breath. He denied any diaphoresis, vomiting, any radiation of the chest pain. He described the episode of chest pain as "weight on my chest", improved with aspirin and 1 nitroglycerin by the EMS. At the time of my examination the chest heaviness has improved significantly. He reports no recent chest pain episodes with exertion. EDP note reviewed, troponin negative so far, no EKG changes suggestive of ischemia  Hospital Course:  Chest pain: -Multiple risk factors including recent CAD with PCI in 03/2014 to RCA, hypertension, diabetes mellitus, ESRD - continue aspirin, beta blocker, Imdur.  -Troponin negative.  -ECHO with normal  Ef, no wall motion abnormalities.  -Might be related to hypotension.  -chest pain free, tolerated dialysis.   Diabetes mellitus type 2:  - Patient has insulin pump, will consult diabetic coordinator. -Continue with insulin Pump..   ESRD (end stage renal disease) on HD - On hemodialysis, patient had dialysis 5-25 - Nephrologist consulted, need to observed during dialysis treatment.  -ok to continue with lasix per renal.   HTN (hypertension) -Continue beta blocker, Imdur home dose.    CAD (coronary artery disease)PCI in 03/2014 to RCA, - Continue ASA, statin, Imdur, beta blocker, effient  hypothyroidism  - Continue Synthroid   Procedures:  Echo: Left ventricle: The cavity size was normal. Wall thickness was increased in a pattern of severe LVH. Systolic function was normal. The estimated ejection fraction was in the range of 55% to 60%. Wall motion was normal; there were no regional wall motion abnormalities. Doppler parameters are consistent with abnormal left ventricular relaxation (grade 1 diastolic dysfunction). - Mitral valve: Calcified annulus. - Left atrium: The atrium was mildly dilated.  Consultations:  Cardiology  nephrology  Discharge Exam: Filed Vitals:   09/19/14 1106  BP: 103/59  Pulse: 86  Temp: 98 F (36.7 C)  Resp: 18    General: Alert in no distress.  Cardiovascular: S 1, S 2 RRR Respiratory: CTA  Discharge Instructions   Discharge Instructions    Diet - low sodium heart healthy    Complete by:  As directed      Increase activity slowly    Complete by:  As directed  Current Discharge Medication List    CONTINUE these medications which have NOT CHANGED   Details  allopurinol (ZYLOPRIM) 100 MG tablet Take 200 mg by mouth daily.     aspirin 81 MG tablet Take 81 mg by mouth every evening.     atorvastatin (LIPITOR) 10 MG tablet Take 10 mg by mouth every evening.     calcium acetate (PHOSLO) 667 MG  capsule Take 667-1,334 mg by mouth See admin instructions. Take 1,334 mg (2 capsules) with meals and 667 (1 capsule) with snacks    diphenhydrAMINE (BENADRYL) 25 mg capsule Take 1-2 capsules (25-50 mg total) by mouth every 6 (six) hours as needed for itching. Qty: 30 capsule, Refills: 0    docusate sodium (COLACE) 100 MG capsule Take 200 mg by mouth 3 (three) times daily as needed for mild constipation.     furosemide (LASIX) 80 MG tablet Take 2 tablets (160 mg total) by mouth 2 (two) times daily. OK to take if you are still urinating and it is helpful.    GLUCAGON EMERGENCY 1 MG injection Inject 1 mg into the muscle daily as needed (low blood sugar).     HYDROmorphone (DILAUDID) 2 MG tablet Take 2 mg by mouth 3 (three) times daily as needed for severe pain.     Insulin Human (INSULIN PUMP) 100 unit/ml SOLN Inject into the skin continuous. Humalog    isosorbide mononitrate (IMDUR) 30 MG 24 hr tablet Take 1 tablet (30 mg total) by mouth daily. Qty: 30 tablet, Refills: 11   Associated Diagnoses: DOE (dyspnea on exertion)    levothyroxine (SYNTHROID, LEVOTHROID) 100 MCG tablet Take 100 mcg by mouth daily before breakfast.    loratadine (CLARITIN) 10 MG tablet Take 10 mg by mouth daily.    metoCLOPramide (REGLAN) 5 MG tablet Take 5 mg by mouth 4 (four) times daily - after meals and at bedtime.    metoprolol succinate (TOPROL-XL) 25 MG 24 hr tablet Take 0.5 tablets (12.5 mg total) by mouth daily. Qty: 15 tablet, Refills: 12    multivitamin (RENA-VIT) TABS tablet Take 1 tablet by mouth daily.    nitroGLYCERIN (NITROSTAT) 0.4 MG SL tablet Place 0.4 mg under the tongue every 5 (five) minutes as needed for chest pain.     prasugrel (EFFIENT) 10 MG TABS tablet Take 1 tablet (10 mg total) by mouth daily. Qty: 30 tablet, Refills: 3   Associated Diagnoses: Dyspnea    promethazine (PHENERGAN) 25 MG tablet Take 25 mg by mouth Every 6 hours as needed for nausea or vomiting. For nausea     ranitidine (ZANTAC) 150 MG tablet Take 1 tablet (150 mg total) by mouth 2 (two) times daily. HOLD for 5 days while on Pepcid (famotidine).    rOPINIRole (REQUIP) 2 MG tablet Take 2 mg by mouth daily with supper.     sevelamer carbonate (RENVELA) 800 MG tablet Take 1,600 mg by mouth 3 (three) times daily with meals.       Allergies  Allergen Reactions  . Doxycycline     RASH  . Hydrocodone     RASH  . Morphine And Related Hives and Nausea Only  . Amoxicillin Other (See Comments)    Reaction unknown  . Ciprofloxacin Other (See Comments)    Reaction unknown  . Clindamycin/Lincomycin Other (See Comments)    Reaction unknown  . Codeine Other (See Comments)    Reaction unknown. But pt has tolerated hydrocodone in Vicodin and Norco in the past  . Levofloxacin Other (  See Comments)    Reaction unknown  . Vasotec Cough  . Contrast Media [Iodinated Diagnostic Agents] Rash  . Oxycodone Rash  . Plavix [Clopidogrel Bisulfate] Rash   Follow-up Information    Follow up with MATTINGLY,MICHAEL T, MD In 1 week.   Specialty:  Nephrology   Contact information:   [place] [address] [phone]       Follow up with [facility], MD In 4 weeks.   Specialty:  Cardiology   Contact information:   [address] [place] [address] [phone]        The results of significant diagnostics from this hospitalization (including imaging, microbiology, ancillary and laboratory) are listed below for reference.    Significant Diagnostic Studies: Dg Chest 2 View  09/17/2014   CLINICAL DATA:  Nauseous prior to dialysis with a weakness today. Substernal chest pressure and lightheadedness.  EXAM: CHEST  2 VIEW  COMPARISON:  05/13/2014  FINDINGS: Lungs are adequately inflated without consolidation or effusion. Mild stable cardiomegaly. Degenerative changes of the spine.  IMPRESSION: No active cardiopulmonary disease.  Stable cardiomegaly.   Electronically  Signed   By: Marin Olp M.D.   On: 09/17/2014 14:21    Microbiology: No results found for this or any previous visit (from the past 240 hour(s)).   Labs: Basic Metabolic Panel:  Recent Labs Lab 09/17/14 1303 09/17/14 1650 09/18/14 1035  NA 132*  --  133*  K 4.1  --  5.3*  CL 93*  --  93*  CO2 28  --  28  GLUCOSE 225*  --  185*  BUN 30*  --  49*  CREATININE 5.24* 6.18* 8.09*  CALCIUM 8.5*  --  9.0   Liver Function Tests:  Recent Labs Lab 09/18/14 1035  AST 18  ALT 17  ALKPHOS 84  BILITOT 0.5  PROT 7.0  ALBUMIN 3.1*   No results for input(s): LIPASE, AMYLASE in the last 168 hours. No results for input(s): AMMONIA in the last 168 hours. CBC:  Recent Labs Lab 09/17/14 1303 09/17/14 1650  WBC 8.0 7.1  NEUTROABS 5.6  --   HGB 10.7* 10.6*  HCT 33.2* 33.5*  MCV 104.1* 105.0*  PLT 219 228   Cardiac Enzymes:  Recent Labs Lab 09/17/14 1303 09/17/14 1650 09/17/14 2242  TROPONINI <0.03 <0.03 <0.03   BNP: BNP (last 3 results) No results for input(s): BNP in the last 8760 hours.  ProBNP (last 3 results) No results for input(s): PROBNP in the last 8760 hours.  CBG:  Recent Labs Lab 09/18/14 1614 09/18/14 2110 09/19/14 0214 09/19/14 0857 09/19/14 1248  GLUCAP 111* 117* 168* 137* 141*       Signed:  Margert Edsall A  [facility] 09/19/2014, 1:15 PM

## 2014-09-23 ENCOUNTER — Encounter (HOSPITAL_COMMUNITY): Payer: Self-pay | Admitting: Emergency Medicine

## 2014-09-23 ENCOUNTER — Emergency Department (HOSPITAL_COMMUNITY)
Admission: EM | Admit: 2014-09-23 | Discharge: 2014-09-24 | Disposition: A | Discharge status: Home / Self Care | Payer: Medicare Other | Attending: Emergency Medicine | Admitting: Emergency Medicine

## 2014-09-23 DIAGNOSIS — Z880 Allergy status to penicillin: Secondary | ICD-10-CM | POA: Diagnosis not present

## 2014-09-23 DIAGNOSIS — E780 Pure hypercholesterolemia: Secondary | ICD-10-CM | POA: Diagnosis not present

## 2014-09-23 DIAGNOSIS — L03113 Cellulitis of right upper limb: Secondary | ICD-10-CM | POA: Diagnosis not present

## 2014-09-23 DIAGNOSIS — M1990 Unspecified osteoarthritis, unspecified site: Secondary | ICD-10-CM | POA: Insufficient documentation

## 2014-09-23 DIAGNOSIS — E119 Type 2 diabetes mellitus without complications: Secondary | ICD-10-CM | POA: Insufficient documentation

## 2014-09-23 DIAGNOSIS — I120 Hypertensive chronic kidney disease with stage 5 chronic kidney disease or end stage renal disease: Secondary | ICD-10-CM | POA: Diagnosis not present

## 2014-09-23 DIAGNOSIS — E039 Hypothyroidism, unspecified: Secondary | ICD-10-CM | POA: Insufficient documentation

## 2014-09-23 DIAGNOSIS — Z7982 Long term (current) use of aspirin: Secondary | ICD-10-CM | POA: Insufficient documentation

## 2014-09-23 DIAGNOSIS — K219 Gastro-esophageal reflux disease without esophagitis: Secondary | ICD-10-CM | POA: Insufficient documentation

## 2014-09-23 DIAGNOSIS — Z862 Personal history of diseases of the blood and blood-forming organs and certain disorders involving the immune mechanism: Secondary | ICD-10-CM | POA: Diagnosis not present

## 2014-09-23 DIAGNOSIS — Z79899 Other long term (current) drug therapy: Secondary | ICD-10-CM | POA: Insufficient documentation

## 2014-09-23 DIAGNOSIS — Z8701 Personal history of pneumonia (recurrent): Secondary | ICD-10-CM | POA: Insufficient documentation

## 2014-09-23 DIAGNOSIS — N186 End stage renal disease: Secondary | ICD-10-CM | POA: Diagnosis not present

## 2014-09-23 DIAGNOSIS — Z794 Long term (current) use of insulin: Secondary | ICD-10-CM | POA: Insufficient documentation

## 2014-09-23 DIAGNOSIS — I2510 Atherosclerotic heart disease of native coronary artery without angina pectoris: Secondary | ICD-10-CM | POA: Insufficient documentation

## 2014-09-23 LAB — CBC WITH DIFFERENTIAL/PLATELET
Basophils Absolute: 0 10*3/uL (ref 0.0–0.1)
Basophils Relative: 0 % (ref 0–1)
Eosinophils Absolute: 0.2 10*3/uL (ref 0.0–0.7)
Eosinophils Relative: 3 % (ref 0–5)
HCT: 31.3 % — ABNORMAL LOW (ref 39.0–52.0)
Hemoglobin: 10 g/dL — ABNORMAL LOW (ref 13.0–17.0)
Lymphocytes Relative: 24 % (ref 12–46)
Lymphs Abs: 1.6 10*3/uL (ref 0.7–4.0)
MCH: 33.7 pg (ref 26.0–34.0)
MCHC: 31.9 g/dL (ref 30.0–36.0)
MCV: 105.4 fL — ABNORMAL HIGH (ref 78.0–100.0)
Monocytes Absolute: 0.4 10*3/uL (ref 0.1–1.0)
Monocytes Relative: 6 % (ref 3–12)
Neutro Abs: 4.5 10*3/uL (ref 1.7–7.7)
Neutrophils Relative %: 67 % (ref 43–77)
Platelets: 264 10*3/uL (ref 150–400)
RBC: 2.97 MIL/uL — ABNORMAL LOW (ref 4.22–5.81)
RDW: 14.2 % (ref 11.5–15.5)
WBC: 6.8 10*3/uL (ref 4.0–10.5)

## 2014-09-23 LAB — BASIC METABOLIC PANEL
Anion gap: 14 (ref 5–15)
BUN: 41 mg/dL — ABNORMAL HIGH (ref 6–20)
CO2: 30 mmol/L (ref 22–32)
Calcium: 9.2 mg/dL (ref 8.9–10.3)
Chloride: 94 mmol/L — ABNORMAL LOW (ref 101–111)
Creatinine, Ser: 9.39 mg/dL — ABNORMAL HIGH (ref 0.61–1.24)
GFR calc Af Amer: 7 mL/min — ABNORMAL LOW (ref >60)
GFR calc non Af Amer: 6 mL/min — ABNORMAL LOW (ref >60)
Glucose, Bld: 79 mg/dL (ref 65–99)
Potassium: 4.6 mmol/L (ref 3.5–5.1)
Sodium: 138 mmol/L (ref 135–145)

## 2014-09-23 NOTE — ED Notes (Signed)
Pt reports he fell while he was in the hospital last and got a laceration to L forearm. Pt said it became extremely painful so he took off the bandage to see and noticed it was red and inflamed. Pt sent here from [facility] for further evaluation.

## 2014-09-24 ENCOUNTER — Emergency Department (HOSPITAL_COMMUNITY): Payer: Medicare Other

## 2014-09-24 MED ORDER — FENTANYL CITRATE (PF) 100 MCG/2ML IJ SOLN
25.0000 ug | Freq: Once | INTRAMUSCULAR | Status: AC
  Administered 2014-09-24: 25 ug via INTRAVENOUS
  Filled 2014-09-24: qty 2

## 2014-09-24 MED ORDER — CEPHALEXIN 250 MG PO CAPS
500.0000 mg | ORAL_CAPSULE | Freq: Once | ORAL | Status: AC
  Administered 2014-09-24: 500 mg via ORAL
  Filled 2014-09-24: qty 2

## 2014-09-24 MED ORDER — SULFAMETHOXAZOLE-TRIMETHOPRIM 800-160 MG PO TABS: 1.0000 | ORAL_TABLET | Freq: Every day | ORAL | Status: AC

## 2014-09-24 MED ORDER — DIPHENHYDRAMINE HCL 50 MG/ML IJ SOLN
25.0000 mg | Freq: Once | INTRAMUSCULAR | Status: AC
  Administered 2014-09-24: 25 mg via INTRAVENOUS
  Filled 2014-09-24: qty 1

## 2014-09-24 MED ORDER — SULFAMETHOXAZOLE-TRIMETHOPRIM 800-160 MG PO TABS
1.0000 | ORAL_TABLET | Freq: Once | ORAL | Status: AC
  Administered 2014-09-24: 1 via ORAL
  Filled 2014-09-24: qty 1

## 2014-09-24 MED ORDER — SULFAMETHOXAZOLE-TRIMETHOPRIM 800-160 MG PO TABS: 1.0000 | ORAL_TABLET | Freq: Once | ORAL | Status: DC

## 2014-09-24 MED ORDER — CEPHALEXIN 500 MG PO CAPS: 500.0000 mg | ORAL_CAPSULE | Freq: Three times a day (TID) | ORAL | Status: DC

## 2014-09-24 MED ORDER — DEXTROSE 5 % IV SOLN
1.0000 g | Freq: Once | INTRAVENOUS | Status: AC
  Administered 2014-09-24: 1 g via INTRAVENOUS
  Filled 2014-09-24: qty 10

## 2014-09-24 NOTE — ED Notes (Addendum)
Pt c/o itching on head and eyes.  Etta Quill NP made aware.  Order received for Benadryl

## 2014-09-24 NOTE — ED Notes (Signed)
Red area outlined with purple marking pen

## 2014-09-24 NOTE — ED Provider Notes (Signed)
CSN: [id number]     Arrival date & time 09/23/14  1941 History   First MD Initiated Contact with Patient 09/23/14 2330     Chief Complaint  Patient presents with  . Wound Infection    (Consider location/radiation/quality/duration/timing/severity/associated sxs/prior Treatment) HPI Comments: 49 year old male with a number comorbidities including hypertension, CAD, ESRD on dialysis M/W/F, IDDM, and dyslipidemia presents to the emergency department for further evaluation of wound to right forearm. Patient states that he was walking in the hall when previously admitted. He was ready for discharge when he leaned on a chair which had wheels. The chair came out from under him causing him to fall and cut his right arm. Patient states that his wound was dressed with Steri-Strips prior to hospital discharge and wrapped. He reports increasing pain to the wound site which prompted him to take off his bandage this afternoon. He noted redness and swelling to the wound site. Temperature at home was 99.74F, per patient. Patient denies any numbness or weakness in the right arm. No reinjury or fall since discharge.  The history is provided by the patient. No language interpreter was used.    Past Medical History  Diagnosis Date  . Hypertension   . History of blood transfusion     "related to dialysis"  . Dyspnea     a. 07/23/11 Echo: EF 55-60%  . Anemia of chronic disease   . Cholecystitis     a. 08/27/2011  . Constipation   . Pericardial effusion     a.  Small by CT 08/22/11;  b.  Large by CT 08/27/11  . Inguinal lymphadenopathy     a. bilateral - s/p biopsy 07/2011  . Fibromyalgia   . Hx of echocardiogram     Echo (11/15):  Mild LVH, EF 60-65%, no RWMA, Gr 1 DD, MAC, mild LAE, normal RVF, mild RAE  . Hx of cardiovascular stress test     Lexiscan Myoview (11/15):  Inf, apical cap and apical lateral ischemia, EF 56%, inf HK, Moderate Risk  . High cholesterol   . Pneumonia ~ 2007  . OSA (obstructive sleep  apnea)     "I'm not doing the mask; claustrophobic"  . Claustrophobia   . Hypothyroidism   . Insulin dependent diabetes mellitus   . GERD (gastroesophageal reflux disease)   . Arthritis     "hands, right knee" (03/19/2014)  . ESRD (end stage renal disease)     a. 1995 s/p cadaveric transplant w/ susbequent failure after 18 yrs;  b.Dialysis initiated 07/2011  . CAD (coronary artery disease)     a. LHC (11/15):  mid to dist Dx 80%, mid RCA 99% (functional CTO), dist RCA 80% >> PCI: balloon angioplasty to mid RCA (could not deliver stent) - plan staged PCI with HSRA of RCA; Dx to be tx medically    Past Surgical History  Procedure Laterality Date  . Kidney transplant  04/25/1993  . Cataract extraction w/ intraocular lens  implant, bilateral Bilateral 1990's  . Arteriovenous graft placement Left 1993?    forearm  . Arteriovenous graft placement Right 1993?    leg  . Toe amputation Bilateral after 2005  . Below knee leg amputation Left 2010  . Carpal tunnel release Bilateral after 2005  . Shoulder arthroscopy Left ~ 2006    frozen  . Av fistula placement  07/29/2011    Procedure: ARTERIOVENOUS (AV) FISTULA CREATION;  Surgeon: Mal Misty, MD;  Location: [facility];  Service: Vascular;  Laterality:  Right;  Brachial cephalic  . Insertion of dialysis catheter  08/01/2011    Procedure: INSERTION OF DIALYSIS CATHETER;  Surgeon: Rosetta Posner, MD;  Location: [facility];  Service: Vascular;  Laterality: Right;  insertion of dialysis catheter on right internal jugular vein  . Lymph node biopsy  08/10/2011    Procedure: LYMPH NODE BIOPSY;  Surgeon: Merrie Roof, MD;  Location: [facility];  Service: General;  Laterality: Left;  left groin lymph biopsy  . Colonoscopy  08/29/2011    Procedure: COLONOSCOPY;  Surgeon: Jerene Bears, MD;  Location: [facility];  Service: Gastroenterology;  Laterality: N/A;  . Appendectomy  ~ 2006  . Hernia repair    . Umbilical hernia repair  1990's  . Cardiac catheterization   03/19/2014  . Av fistula placement Left 1993?    "attempted one in my wrist; didn't take"  . Neuroplasty / transposition ulnar nerve at elbow Left after 2005  . Venogram Right 07/28/2011    Procedure: VENOGRAM;  Surgeon: Conrad Hidalgo, MD;  Location: [facility] CATH LAB;  Service: Cardiovascular;  Laterality: Right;  . Left heart catheterization with coronary angiogram N/A 03/19/2014    Procedure: LEFT HEART CATHETERIZATION WITH CORONARY ANGIOGRAM;  Surgeon: Burnell Blanks, MD;  Location: [facility] CATH LAB;  Service: Cardiovascular;  Laterality: N/A;  . Cardiac catheterization  03/19/2014    Procedure: CORONARY BALLOON ANGIOPLASTY;  Surgeon: Burnell Blanks, MD;  Location: [facility] CATH LAB;  Service: Cardiovascular;;  . Coronary angioplasty with stent placement  04/09/2014        ptca/des mid lad   . Angioplasty  04/09/2014    RCA   . Femoral artery repair  04/09/2014    ANGIOSEAL  . Eye surgery    . Percutaneous coronary rotoblator intervention (pci-r) N/A 04/09/2014    Procedure: PERCUTANEOUS CORONARY ROTOBLATOR INTERVENTION (PCI-R);  Surgeon: Burnell Blanks, MD;  Location: [facility] CATH LAB;  Service: Cardiovascular;  Laterality: N/A;   Family History  Problem Relation Age of Onset  . Malignant hyperthermia Neg Hx   . Colon polyps Father   . Diabetes Father   . Cancer Father   . Breast cancer Maternal Aunt   . Cancer Maternal Aunt     Breast and Bone  . Heart attack Neg Hx   . Stroke Neg Hx   . Hypertension Mother   . Hypertension Father   . Hypertension Father    History  Substance Use Topics  . Smoking status: Never Smoker   . Smokeless tobacco: Never Used  . Alcohol Use: Yes     Comment: 03/19/2014 "might have a drink 2-3 times/year"    Review of Systems  Musculoskeletal: Positive for myalgias.  Skin: Positive for color change and wound.  All other systems reviewed and are negative.   Allergies  Doxycycline; Hydrocodone; Morphine and related; Amoxicillin;  Ciprofloxacin; Clindamycin/lincomycin; Codeine; Levofloxacin; Rocephin; Vasotec; Contrast media; Oxycodone; and Plavix  Home Medications   Prior to Admission medications   Medication Sig Start Date End Date Taking? Authorizing Provider  allopurinol (ZYLOPRIM) 100 MG tablet Take 200 mg by mouth daily.  03/14/12   Historical Provider, MD  aspirin 81 MG tablet Take 81 mg by mouth every evening.     Historical Provider, MD  atorvastatin (LIPITOR) 10 MG tablet Take 10 mg by mouth every evening.     Historical Provider, MD  calcium acetate (PHOSLO) 667 MG capsule Take 667-1,334 mg by mouth See admin instructions. Take 1,334 mg (2 capsules)  with meals and 667 (1 capsule) with snacks 09/19/12   Historical Provider, MD  cephALEXin (KEFLEX) 500 MG capsule Take 1 capsule (500 mg total) by mouth 3 (three) times daily. 09/24/14   Antonietta Breach, PA-C  diphenhydrAMINE (BENADRYL) 25 mg capsule Take 1-2 capsules (25-50 mg total) by mouth every 6 (six) hours as needed for itching. 04/11/14   Rhonda G Barrett, PA-C  docusate sodium (COLACE) 100 MG capsule Take 200 mg by mouth 3 (three) times daily as needed for mild constipation.     Historical Provider, MD  furosemide (LASIX) 80 MG tablet Take 2 tablets (160 mg total) by mouth 2 (two) times daily. OK to take if you are still urinating and it is helpful. 04/11/14   Rhonda G Barrett, PA-C  GLUCAGON EMERGENCY 1 MG injection Inject 1 mg into the muscle daily as needed (low blood sugar).  01/19/12   Historical Provider, MD  HYDROmorphone (DILAUDID) 2 MG tablet Take 2 mg by mouth 3 (three) times daily as needed for severe pain.  09/03/12   Historical Provider, MD  Insulin Human (INSULIN PUMP) 100 unit/ml SOLN Inject into the skin continuous. Humalog    Historical Provider, MD  isosorbide mononitrate (IMDUR) 30 MG 24 hr tablet Take 1 tablet (30 mg total) by mouth daily. 03/06/14   Liliane Shi, PA-C  levothyroxine (SYNTHROID, LEVOTHROID) 100 MCG tablet Take 100 mcg by mouth  daily before breakfast.    Historical Provider, MD  loratadine (CLARITIN) 10 MG tablet Take 10 mg by mouth daily.    Historical Provider, MD  metoCLOPramide (REGLAN) 5 MG tablet Take 5 mg by mouth 4 (four) times daily - after meals and at bedtime. 05/26/11   Historical Provider, MD  metoprolol succinate (TOPROL-XL) 25 MG 24 hr tablet Take 0.5 tablets (12.5 mg total) by mouth daily. 03/20/14   Liliane Shi, PA-C  multivitamin (RENA-VIT) TABS tablet Take 1 tablet by mouth daily.    Historical Provider, MD  nitroGLYCERIN (NITROSTAT) 0.4 MG SL tablet Place 0.4 mg under the tongue every 5 (five) minutes as needed for chest pain.     [name], MD  prasugrel (EFFIENT) 10 MG TABS tablet Take 1 tablet (10 mg total) by mouth daily. 04/03/14   Liliane Shi, PA-C  promethazine (PHENERGAN) 25 MG tablet Take 25 mg by mouth Every 6 hours as needed for nausea or vomiting. For nausea 07/13/11   Historical Provider, MD  ranitidine (ZANTAC) 150 MG tablet Take 1 tablet (150 mg total) by mouth 2 (two) times daily. HOLD for 5 days while on Pepcid (famotidine). 04/11/14   Rhonda G Barrett, PA-C  rOPINIRole (REQUIP) 2 MG tablet Take 2 mg by mouth daily with supper.     Historical Provider, MD  sevelamer carbonate (RENVELA) 800 MG tablet Take 1,600 mg by mouth 3 (three) times daily with meals.    Historical Provider, MD  sulfamethoxazole-trimethoprim (BACTRIM DS,SEPTRA DS) 800-160 MG per tablet Take 1 tablet by mouth at bedtime. 09/24/14 10/01/14  Antonietta Breach, PA-C   BP 126/59 mmHg  Pulse 88  Temp(Src) 98.8 F (37.1 C) (Oral)  Resp 20  Ht 5\' 8"  (1.727 m)  Wt 276 lb (125.193 kg)  BMI 41.98 kg/m2  SpO2 96%   Physical Exam  Constitutional: He is oriented to person, place, and time. He appears well-developed and well-nourished. No distress.  Nontoxic/nonseptic appearing  HENT:  Head: Normocephalic and atraumatic.  Eyes: Conjunctivae and EOM are normal. No scleral icterus.  Neck: Normal  range of motion.    Cardiovascular: Normal rate, regular rhythm and intact distal pulses.   Distal radial pulse 2+ in the right upper extremity. Capillary refill brisk in all digits of right hand.  Pulmonary/Chest: Effort normal. No respiratory distress.  Musculoskeletal: Normal range of motion. He exhibits tenderness.       Right forearm: He exhibits tenderness, swelling, edema (mild) and laceration. He exhibits no bony tenderness and no deformity.       Arms:      Legs: Superficial lacerations noted to the right forearm. There is induration and erythema, and mild heat to touch surrounding wounds. Overall, however, compartments soft. No active bleeding or purulent drainage. No red linear streaking up the right arm.  Neurological: He is alert and oriented to person, place, and time. He exhibits normal muscle tone. Coordination normal.  Sensation to light touch intact in all digits of the right hand. Patient able to wiggle all fingers. GCS 15.  Skin: Skin is warm and dry. No rash noted. He is not diaphoretic. There is erythema. No pallor.  Psychiatric: He has a normal mood and affect. His behavior is normal.  Nursing note and vitals reviewed.   ED Course  Procedures (including critical care time) Labs Review Labs Reviewed  CBC WITH DIFFERENTIAL/PLATELET - Abnormal; Notable for the following:    RBC 2.97 (*)    Hemoglobin 10.0 (*)    HCT 31.3 (*)    MCV 105.4 (*)    All other components within normal limits  BASIC METABOLIC PANEL - Abnormal; Notable for the following:    Chloride 94 (*)    BUN 41 (*)    Creatinine, Ser 9.39 (*)    GFR calc non Af Amer 6 (*)    GFR calc Af Amer 7 (*)    All other components within normal limits    Imaging Review Dg Forearm Right  09/24/2014   CLINICAL DATA:  Wound infection. Fall 4 days prior with laceration, now with redness.  EXAM: RIGHT FOREARM - 2 VIEW  COMPARISON:  None.  FINDINGS: Cortical margins of the radius ulna are intact. There is no fracture. No erosion or  periosteal reaction. Cystic changes noted in the scaphoid. There are dense vascular calcifications. Soft tissue edema about the forearm without soft tissue air or radiopaque foreign body.  IMPRESSION: Soft tissue edema without soft tissue air or radiopaque foreign body. No acute osseous abnormality or findings to suggest osteomyelitis.   Electronically Signed   By: Jeb Levering M.D.   On: 09/24/2014 01:08     EKG Interpretation None      MDM   Final diagnoses:  Cellulitis of right forearm    Patient is a 49 year old male insulin-dependent diabetic with end-stage renal disease on M/W/F dialysis who presents to the emergency department for further evaluation of wound. Wound was sustained following a fall on date of discharge from the hospital, 4 days ago. Patient neurovascularly intact with evidence of cellulitis to right forearm. Patient is afebrile and hemodynamically stable today. No leukocytosis on CBC. No red linear streaking. No evidence of necrotizing fasciitis or subcutaneous gas on imaging.  Symptoms treated in ED with Rocephin IV. Will discharge with Keflex as well as dose adjusted course of Bactrim. Wound dressed in ED. Have discussed reasons to return to the emergency department, such as if redness worsens despite taking antibiotic for a full 24 hours. I have advised the patient had his wound rechecked in 48 hours. Patient agreeable to plan  with no unaddressed concerns. Patient discharged in good condition.   Filed Vitals:   09/23/14 1949 09/23/14 2317 09/24/14 0224  BP: 143/70 138/62 126/59  Pulse: 85 86 88  Temp: 98.8 F (37.1 C)    TempSrc: Oral    Resp: 22 20 20   Height: 5\' 8"  (1.727 m)    Weight: 276 lb (125.193 kg)    SpO2: 99% 95% 96%     Antonietta Breach, PA-C 09/24/14 0316  Pamella Pert, MD 09/24/14 1530

## 2014-09-24 NOTE — ED Notes (Signed)
Patient transported to X-ray 

## 2014-09-24 NOTE — Discharge Instructions (Signed)
Take Bactrim and Keflex as prescribed. You have been giving your first dose of these medications prior to discharge from the emergency department. Your next dose of Keflex is due at [place] on 09/24/14. Take your next dose of Bactrim before bed on 09/24/14. Return to the emergency department if your redness spreads beyond the lines drawn despite a full 24 hours of antibiotic use. Return for recheck in 48 hours otherwise.  Cellulitis Cellulitis is an infection of the skin and the tissue beneath it. The infected area is usually red and tender. Cellulitis occurs most often in the arms and lower legs.  CAUSES  Cellulitis is caused by bacteria that enter the skin through cracks or cuts in the skin. The most common types of bacteria that cause cellulitis are staphylococci and streptococci. SIGNS AND SYMPTOMS   Redness and warmth.  Swelling.  Tenderness or pain.  Fever. DIAGNOSIS  Your health care provider can usually determine what is wrong based on a physical exam. Blood tests may also be done. TREATMENT  Treatment usually involves taking an antibiotic medicine. HOME CARE INSTRUCTIONS   Take your antibiotic medicine as directed by your health care provider. Finish the antibiotic even if you start to feel better.  Keep the infected arm or leg elevated to reduce swelling.  Apply a warm cloth to the affected area up to 4 times per day to relieve pain.  Take medicines only as directed by your health care provider.  Keep all follow-up visits as directed by your health care provider. SEEK MEDICAL CARE IF:   You notice red streaks coming from the infected area.  Your red area gets larger or turns dark in color.  Your bone or joint underneath the infected area becomes painful after the skin has healed.  Your infection returns in the same area or another area.  You notice a swollen bump in the infected area.  You develop new symptoms.  You have a fever. SEEK IMMEDIATE MEDICAL CARE IF:    You feel very sleepy.  You develop vomiting or diarrhea.  You have a general ill feeling (malaise) with muscle aches and pains. MAKE SURE YOU:   Understand these instructions.  Will watch your condition.  Will get help right away if you are not doing well or get worse. Document Released: 01/19/2005 Document Revised: 08/26/2013 Document Reviewed: 06/27/2011 [facility] Patient Information 2015 [place], [place]. This information is not intended to replace advice given to you by your health care provider. Make sure you discuss any questions you have with your health care provider.  Dressing Change A dressing is a material placed over wounds. It keeps the wound clean, dry, and protected from further injury. This provides an environment that favors wound healing.  BEFORE YOU BEGIN  Get your supplies together. Things you may need include:  Saline solution.  Flexible gauze dressing.  Medicated cream.  Tape.  Gloves.  Abdominal dressing pads.  Gauze squares.  Plastic bags.  Take pain medicine 30 minutes before the dressing change if you need it.  Take a shower before you do the first dressing change of the day. Use plastic wrap or a plastic bag to prevent the dressing from getting wet. REMOVING YOUR OLD DRESSING   Wash your hands with soap and water. Dry your hands with a clean towel.  Put on your gloves.  Remove any tape.  Carefully remove the old dressing. If the dressing sticks, you may dampen it with warm water to loosen it, or follow your  caregiver's specific directions.  Remove any gauze or packing tape that is in your wound.  Take off your gloves.  Put the gloves, tape, gauze, or any packing tape into a plastic bag. CHANGING YOUR DRESSING  Open the supplies.  Take the cap off the saline solution.  Open the gauze package so that the gauze remains on the inside of the package.  Put on your gloves.  Clean your wound as told by your caregiver.  If you  have been told to keep your wound dry, follow those instructions.  Your caregiver may tell you to do one or more of the following:  Pick up the gauze. Pour the saline solution over the gauze. Squeeze out the extra saline solution.  Put medicated cream or other medicine on your wound if you have been told to do so.  Put the solution soaked gauze only in your wound, not on the skin around it.  Pack your wound loosely or as told by your caregiver.  Put dry gauze on your wound.  Put abdominal dressing pads over the dry gauze if your wet gauze soaks through.  Tape the abdominal dressing pads in place so they will not fall off. Do not wrap the tape completely around the affected part (arm, leg, abdomen).  Wrap the dressing pads with a flexible gauze dressing to secure it in place.  Take off your gloves. Put them in the plastic bag with the old dressing. Tie the bag shut and throw it away.  Keep the dressing clean and dry until your next dressing change.  Wash your hands. SEEK MEDICAL CARE IF:  Your skin around the wound looks red.  Your wound feels more tender or sore.  You see pus in the wound.  Your wound smells bad.  You have a fever.  Your skin around the wound has a rash that itches and burns.  You see black or yellow skin in your wound that was not there before.  You feel nauseous, throw up, and feel very tired. Document Released: 05/19/2004 Document Revised: 07/04/2011 Document Reviewed: 02/21/2011 [facility] Patient Information 2015 [place], [place]. This information is not intended to replace advice given to you by your health care provider. Make sure you discuss any questions you have with your health care provider.

## 2014-09-24 NOTE — ED Notes (Signed)
Pt st's he was being discharged from [place] and fell.  Pt had large skin tear to right forearm.  Pt st's arm has been bandaged but tonight when he removed the bandage and noticed area was very red and painful.

## 2014-09-29 ENCOUNTER — Encounter (HOSPITAL_COMMUNITY): Payer: Self-pay | Admitting: Emergency Medicine

## 2014-09-29 ENCOUNTER — Inpatient Hospital Stay (HOSPITAL_COMMUNITY)
Admission: EM | Admit: 2014-09-29 | Discharge: 2014-10-03 | DRG: 314 | Disposition: A | Discharge status: Home / Self Care | Payer: Medicare Other | Attending: Internal Medicine | Admitting: Internal Medicine

## 2014-09-29 ENCOUNTER — Other Ambulatory Visit (HOSPITAL_COMMUNITY): Payer: Self-pay

## 2014-09-29 DIAGNOSIS — E11319 Type 2 diabetes mellitus with unspecified diabetic retinopathy without macular edema: Secondary | ICD-10-CM | POA: Diagnosis present

## 2014-09-29 DIAGNOSIS — Z886 Allergy status to analgesic agent status: Secondary | ICD-10-CM

## 2014-09-29 DIAGNOSIS — I959 Hypotension, unspecified: Secondary | ICD-10-CM | POA: Diagnosis not present

## 2014-09-29 DIAGNOSIS — R0600 Dyspnea, unspecified: Secondary | ICD-10-CM

## 2014-09-29 DIAGNOSIS — I272 Other secondary pulmonary hypertension: Secondary | ICD-10-CM | POA: Diagnosis present

## 2014-09-29 DIAGNOSIS — Z794 Long term (current) use of insulin: Secondary | ICD-10-CM

## 2014-09-29 DIAGNOSIS — M1990 Unspecified osteoarthritis, unspecified site: Secondary | ICD-10-CM | POA: Diagnosis present

## 2014-09-29 DIAGNOSIS — Z89421 Acquired absence of other right toe(s): Secondary | ICD-10-CM

## 2014-09-29 DIAGNOSIS — R0609 Other forms of dyspnea: Secondary | ICD-10-CM

## 2014-09-29 DIAGNOSIS — Z888 Allergy status to other drugs, medicaments and biological substances status: Secondary | ICD-10-CM

## 2014-09-29 DIAGNOSIS — I739 Peripheral vascular disease, unspecified: Secondary | ICD-10-CM | POA: Diagnosis present

## 2014-09-29 DIAGNOSIS — Z91041 Radiographic dye allergy status: Secondary | ICD-10-CM

## 2014-09-29 DIAGNOSIS — N2581 Secondary hyperparathyroidism of renal origin: Secondary | ICD-10-CM | POA: Diagnosis present

## 2014-09-29 DIAGNOSIS — Z9841 Cataract extraction status, right eye: Secondary | ICD-10-CM

## 2014-09-29 DIAGNOSIS — Z91018 Allergy to other foods: Secondary | ICD-10-CM

## 2014-09-29 DIAGNOSIS — R110 Nausea: Secondary | ICD-10-CM | POA: Diagnosis present

## 2014-09-29 DIAGNOSIS — R0902 Hypoxemia: Secondary | ICD-10-CM | POA: Diagnosis present

## 2014-09-29 DIAGNOSIS — Z992 Dependence on renal dialysis: Secondary | ICD-10-CM

## 2014-09-29 DIAGNOSIS — I120 Hypertensive chronic kidney disease with stage 5 chronic kidney disease or end stage renal disease: Secondary | ICD-10-CM | POA: Diagnosis present

## 2014-09-29 DIAGNOSIS — Z940 Kidney transplant status: Secondary | ICD-10-CM

## 2014-09-29 DIAGNOSIS — R42 Dizziness and giddiness: Secondary | ICD-10-CM | POA: Diagnosis present

## 2014-09-29 DIAGNOSIS — M797 Fibromyalgia: Secondary | ICD-10-CM | POA: Diagnosis present

## 2014-09-29 DIAGNOSIS — Z9842 Cataract extraction status, left eye: Secondary | ICD-10-CM

## 2014-09-29 DIAGNOSIS — Z955 Presence of coronary angioplasty implant and graft: Secondary | ICD-10-CM

## 2014-09-29 DIAGNOSIS — Z9641 Presence of insulin pump (external) (internal): Secondary | ICD-10-CM | POA: Diagnosis present

## 2014-09-29 DIAGNOSIS — E780 Pure hypercholesterolemia: Secondary | ICD-10-CM | POA: Diagnosis present

## 2014-09-29 DIAGNOSIS — I2510 Atherosclerotic heart disease of native coronary artery without angina pectoris: Secondary | ICD-10-CM | POA: Diagnosis present

## 2014-09-29 DIAGNOSIS — N186 End stage renal disease: Secondary | ICD-10-CM | POA: Diagnosis present

## 2014-09-29 DIAGNOSIS — I4581 Long QT syndrome: Secondary | ICD-10-CM | POA: Diagnosis present

## 2014-09-29 DIAGNOSIS — E1121 Type 2 diabetes mellitus with diabetic nephropathy: Secondary | ICD-10-CM | POA: Diagnosis present

## 2014-09-29 DIAGNOSIS — G4733 Obstructive sleep apnea (adult) (pediatric): Secondary | ICD-10-CM | POA: Diagnosis present

## 2014-09-29 DIAGNOSIS — Z89512 Acquired absence of left leg below knee: Secondary | ICD-10-CM

## 2014-09-29 DIAGNOSIS — Z881 Allergy status to other antibiotic agents status: Secondary | ICD-10-CM

## 2014-09-29 DIAGNOSIS — E039 Hypothyroidism, unspecified: Secondary | ICD-10-CM | POA: Diagnosis present

## 2014-09-29 DIAGNOSIS — D631 Anemia in chronic kidney disease: Secondary | ICD-10-CM | POA: Diagnosis present

## 2014-09-29 DIAGNOSIS — Z6841 Body Mass Index (BMI) 40.0 and over, adult: Secondary | ICD-10-CM

## 2014-09-29 DIAGNOSIS — I5030 Unspecified diastolic (congestive) heart failure: Secondary | ICD-10-CM | POA: Diagnosis present

## 2014-09-29 DIAGNOSIS — K219 Gastro-esophageal reflux disease without esophagitis: Secondary | ICD-10-CM | POA: Diagnosis present

## 2014-09-29 DIAGNOSIS — Z961 Presence of intraocular lens: Secondary | ICD-10-CM | POA: Diagnosis present

## 2014-09-29 LAB — CBC WITH DIFFERENTIAL/PLATELET
Basophils Absolute: 0 10*3/uL (ref 0.0–0.1)
Basophils Relative: 0 % (ref 0–1)
Eosinophils Absolute: 0.3 10*3/uL (ref 0.0–0.7)
Eosinophils Relative: 3 % (ref 0–5)
HCT: 30.5 % — ABNORMAL LOW (ref 39.0–52.0)
Hemoglobin: 9.5 g/dL — ABNORMAL LOW (ref 13.0–17.0)
Lymphocytes Relative: 22 % (ref 12–46)
Lymphs Abs: 1.9 10*3/uL (ref 0.7–4.0)
MCH: 33.2 pg (ref 26.0–34.0)
MCHC: 31.1 g/dL (ref 30.0–36.0)
MCV: 106.6 fL — ABNORMAL HIGH (ref 78.0–100.0)
Monocytes Absolute: 0.5 10*3/uL (ref 0.1–1.0)
Monocytes Relative: 6 % (ref 3–12)
Neutro Abs: 6.1 10*3/uL (ref 1.7–7.7)
Neutrophils Relative %: 69 % (ref 43–77)
Platelets: 151 10*3/uL (ref 150–400)
RBC: 2.86 MIL/uL — ABNORMAL LOW (ref 4.22–5.81)
RDW: 14.6 % (ref 11.5–15.5)
WBC: 8.8 10*3/uL (ref 4.0–10.5)

## 2014-09-29 LAB — COMPREHENSIVE METABOLIC PANEL
ALT: 16 U/L — ABNORMAL LOW (ref 17–63)
AST: 19 U/L (ref 15–41)
Albumin: 2.8 g/dL — ABNORMAL LOW (ref 3.5–5.0)
Alkaline Phosphatase: 92 U/L (ref 38–126)
Anion gap: 11 (ref 5–15)
BUN: 26 mg/dL — ABNORMAL HIGH (ref 6–20)
CO2: 27 mmol/L (ref 22–32)
Calcium: 8.1 mg/dL — ABNORMAL LOW (ref 8.9–10.3)
Chloride: 96 mmol/L — ABNORMAL LOW (ref 101–111)
Creatinine, Ser: 6.73 mg/dL — ABNORMAL HIGH (ref 0.61–1.24)
GFR calc Af Amer: 10 mL/min — ABNORMAL LOW (ref >60)
GFR calc non Af Amer: 9 mL/min — ABNORMAL LOW (ref >60)
Glucose, Bld: 235 mg/dL — ABNORMAL HIGH (ref 65–99)
Potassium: 3.8 mmol/L (ref 3.5–5.1)
Sodium: 134 mmol/L — ABNORMAL LOW (ref 135–145)
Total Bilirubin: 0.5 mg/dL (ref 0.3–1.2)
Total Protein: 6.6 g/dL (ref 6.5–8.1)

## 2014-09-29 LAB — CBG MONITORING, ED: Glucose-Capillary: 244 mg/dL — ABNORMAL HIGH (ref 65–99)

## 2014-09-29 MED ORDER — SODIUM CHLORIDE 0.9 % IV BOLUS (SEPSIS)
250.0000 mL | Freq: Once | INTRAVENOUS | Status: AC
  Administered 2014-09-29: 250 mL via INTRAVENOUS

## 2014-09-29 NOTE — ED Notes (Signed)
Provided ekg to dr. Alvino Chapel, and reported bp of 82/50 recently.

## 2014-09-29 NOTE — ED Notes (Signed)
Reported nausea to Dr. Alvino Chapel, and qt time on ekg 522. MD acknowledges, orders bolus of 245mL.

## 2014-09-29 NOTE — ED Notes (Signed)
Joe, pa-C, at the bedside.

## 2014-09-29 NOTE — ED Notes (Signed)
Pt st's blood pressure has been low since he finished dialysis today. St's he feels weak

## 2014-09-29 NOTE — ED Provider Notes (Signed)
CSN: [id number]     Arrival date & time 09/29/14  1945 History   First MD Initiated Contact with Patient 09/29/14 2145     Chief Complaint  Patient presents with  . Hypotension     (Consider location/radiation/quality/duration/timing/severity/associated sxs/prior Treatment) HPI 49 year old male with past medical history of ESRD, on dialysis Monday/Wednesday/Friday, hypertension, CAD, fibromyalgia, left BKA, chronic anemia who presents the ER complaining of low blood pressure. Patient states for the last several weeks he has been experiencing hypotension status post hemodialysis. He reports associated fatigue and generalized weakness. Patient states this is an ongoing for several weeks, he noticed today that his blood pressures in the [id number] systolic this evening after undergoing hemodialysis this morning. Patient states he took off approximately 4-1/2 L today. Patient denies any headache, blurred vision, dizziness, chest pain, shortness of breath, nausea, vomiting, abdominal pain.  Patient also here for reevaluation of wound. Patient states several weeks ago he suffered a mechanical fall which resulted in 2 wounds to his right forearm. He has wife report associated redness around these wounds recently, over the past week. They state they have not noticed any improvement in this redness. Patient denies fever, chills, nausea, vomiting associated.  Past Medical History  Diagnosis Date  . Hypertension   . History of blood transfusion     "related to dialysis"  . Dyspnea     a. 07/23/11 Echo: EF 55-60%  . Anemia of chronic disease   . Cholecystitis     a. 08/27/2011  . Constipation   . Pericardial effusion     a.  Small by CT 08/22/11;  b.  Large by CT 08/27/11  . Inguinal lymphadenopathy     a. bilateral - s/p biopsy 07/2011  . Fibromyalgia   . Hx of echocardiogram     Echo (11/15):  Mild LVH, EF 60-65%, no RWMA, Gr 1 DD, MAC, mild LAE, normal RVF, mild RAE  . Hx of cardiovascular stress test      Lexiscan Myoview (11/15):  Inf, apical cap and apical lateral ischemia, EF 56%, inf HK, Moderate Risk  . High cholesterol   . Pneumonia ~ 2007  . OSA (obstructive sleep apnea)     "I'm not doing the mask; claustrophobic"  . Claustrophobia   . Hypothyroidism   . Insulin dependent diabetes mellitus   . GERD (gastroesophageal reflux disease)   . Arthritis     "hands, right knee" (03/19/2014)  . ESRD (end stage renal disease)     a. 1995 s/p cadaveric transplant w/ susbequent failure after 18 yrs;  b.Dialysis initiated 07/2011  . CAD (coronary artery disease)     a. LHC (11/15):  mid to dist Dx 80%, mid RCA 99% (functional CTO), dist RCA 80% >> PCI: balloon angioplasty to mid RCA (could not deliver stent) - plan staged PCI with HSRA of RCA; Dx to be tx medically    Past Surgical History  Procedure Laterality Date  . Kidney transplant  04/25/1993  . Cataract extraction w/ intraocular lens  implant, bilateral Bilateral 1990's  . Arteriovenous graft placement Left 1993?    forearm  . Arteriovenous graft placement Right 1993?    leg  . Toe amputation Bilateral after 2005  . Below knee leg amputation Left 2010  . Carpal tunnel release Bilateral after 2005  . Shoulder arthroscopy Left ~ 2006    frozen  . Av fistula placement  07/29/2011    Procedure: ARTERIOVENOUS (AV) FISTULA CREATION;  Surgeon: Mal Misty, MD;  Location: [facility] OR;  Service: Vascular;  Laterality: Right;  Brachial cephalic  . Insertion of dialysis catheter  08/01/2011    Procedure: INSERTION OF DIALYSIS CATHETER;  Surgeon: Rosetta Posner, MD;  Location: [facility];  Service: Vascular;  Laterality: Right;  insertion of dialysis catheter on right internal jugular vein  . Lymph node biopsy  08/10/2011    Procedure: LYMPH NODE BIOPSY;  Surgeon: Merrie Roof, MD;  Location: [facility];  Service: General;  Laterality: Left;  left groin lymph biopsy  . Colonoscopy  08/29/2011    Procedure: COLONOSCOPY;  Surgeon: Jerene Bears, MD;  Location: [place];  Service: Gastroenterology;  Laterality: N/A;  . Appendectomy  ~ 2006  . Hernia repair    . Umbilical hernia repair  1990's  . Cardiac catheterization  03/19/2014  . Av fistula placement Left 1993?    "attempted one in my wrist; didn't take"  . Neuroplasty / transposition ulnar nerve at elbow Left after 2005  . Venogram Right 07/28/2011    Procedure: VENOGRAM;  Surgeon: Conrad Ridgeway, MD;  Location: [facility] CATH LAB;  Service: Cardiovascular;  Laterality: Right;  . Left heart catheterization with coronary angiogram N/A 03/19/2014    Procedure: LEFT HEART CATHETERIZATION WITH CORONARY ANGIOGRAM;  Surgeon: Burnell Blanks, MD;  Location: [facility] CATH LAB;  Service: Cardiovascular;  Laterality: N/A;  . Cardiac catheterization  03/19/2014    Procedure: CORONARY BALLOON ANGIOPLASTY;  Surgeon: Burnell Blanks, MD;  Location: [facility] CATH LAB;  Service: Cardiovascular;;  . Coronary angioplasty with stent placement  04/09/2014        ptca/des mid lad   . Angioplasty  04/09/2014    RCA   . Femoral artery repair  04/09/2014    ANGIOSEAL  . Eye surgery    . Percutaneous coronary rotoblator intervention (pci-r) N/A 04/09/2014    Procedure: PERCUTANEOUS CORONARY ROTOBLATOR INTERVENTION (PCI-R);  Surgeon: Burnell Blanks, MD;  Location: [facility] CATH LAB;  Service: Cardiovascular;  Laterality: N/A;   Family History  Problem Relation Age of Onset  . Malignant hyperthermia Neg Hx   . Colon polyps Father   . Diabetes Father   . Cancer Father   . Breast cancer Maternal Aunt   . Cancer Maternal Aunt     Breast and Bone  . Heart attack Neg Hx   . Stroke Neg Hx   . Hypertension Mother   . Hypertension Father   . Hypertension Father    History  Substance Use Topics  . Smoking status: Never Smoker   . Smokeless tobacco: Never Used  . Alcohol Use: Yes     Comment: 03/19/2014 "might have a drink 2-3 times/year"    Review of Systems  Constitutional: Positive for fatigue. Negative for  fever.  HENT: Negative for trouble swallowing.   Eyes: Negative for visual disturbance.  Respiratory: Negative for shortness of breath.   Cardiovascular: Negative for chest pain.  Gastrointestinal: Negative for nausea, vomiting and abdominal pain.  Genitourinary: Negative for dysuria.  Musculoskeletal: Negative for neck pain.  Skin: Negative for rash.  Neurological: Negative for dizziness, weakness and numbness.  Psychiatric/Behavioral: Negative.       Allergies  Doxycycline; Hydrocodone; Morphine and related; Amoxicillin; Ciprofloxacin; Clindamycin/lincomycin; Codeine; Levofloxacin; Rocephin; Vasotec; Contrast media; Oxycodone; and Plavix  Home Medications   Prior to Admission medications   Medication Sig Start Date End Date Taking? Authorizing Provider  allopurinol (ZYLOPRIM) 100 MG tablet Take 200 mg by mouth daily.  03/14/12  Yes  Historical Provider, MD  aspirin 81 MG tablet Take 81 mg by mouth every evening.    Yes Historical Provider, MD  atorvastatin (LIPITOR) 10 MG tablet Take 10 mg by mouth every evening.    Yes Historical Provider, MD  calcium acetate (PHOSLO) 667 MG capsule Take 667-1,334 mg by mouth See admin instructions. Take 1,334 mg (2 capsules) with meals and 667 (1 capsule) with snacks 09/19/12  Yes Historical Provider, MD  cephALEXin (KEFLEX) 500 MG capsule Take 1 capsule (500 mg total) by mouth 3 (three) times daily. 09/24/14  Yes Antonietta Breach, PA-C  diphenhydrAMINE (BENADRYL) 25 mg capsule Take 1-2 capsules (25-50 mg total) by mouth every 6 (six) hours as needed for itching. 04/11/14  Yes Rhonda G Barrett, PA-C  docusate sodium (COLACE) 100 MG capsule Take 200 mg by mouth 3 (three) times daily as needed for mild constipation.    Yes Historical Provider, MD  furosemide (LASIX) 80 MG tablet Take 2 tablets (160 mg total) by mouth 2 (two) times daily. OK to take if you are still urinating and it is helpful. 04/11/14  Yes Rhonda G Barrett, PA-C  GLUCAGON EMERGENCY 1 MG  injection Inject 1 mg into the muscle daily as needed (low blood sugar).  01/19/12  Yes Historical Provider, MD  HYDROmorphone (DILAUDID) 2 MG tablet Take 2 mg by mouth 3 (three) times daily as needed for severe pain.  09/03/12  Yes Historical Provider, MD  Insulin Human (INSULIN PUMP) 100 unit/ml SOLN Inject into the skin continuous. Humalog   Yes Historical Provider, MD  isosorbide mononitrate (IMDUR) 30 MG 24 hr tablet Take 1 tablet (30 mg total) by mouth daily. 03/06/14  Yes Scott Joylene Draft, PA-C  levothyroxine (SYNTHROID, LEVOTHROID) 100 MCG tablet Take 100 mcg by mouth daily before breakfast.   Yes Historical Provider, MD  loratadine (CLARITIN) 10 MG tablet Take 10 mg by mouth daily.   Yes Historical Provider, MD  metoCLOPramide (REGLAN) 5 MG tablet Take 5 mg by mouth 4 (four) times daily - after meals and at bedtime. 05/26/11  Yes Historical Provider, MD  metoprolol succinate (TOPROL-XL) 25 MG 24 hr tablet Take 0.5 tablets (12.5 mg total) by mouth daily. 03/20/14  Yes Liliane Shi, PA-C  multivitamin (RENA-VIT) TABS tablet Take 1 tablet by mouth daily.   Yes Historical Provider, MD  nitroGLYCERIN (NITROSTAT) 0.4 MG SL tablet Place 0.4 mg under the tongue every 5 (five) minutes as needed for chest pain.    Yes [name], MD  promethazine (PHENERGAN) 25 MG tablet Take 25 mg by mouth Every 6 hours as needed for nausea or vomiting. For nausea 07/13/11  Yes Historical Provider, MD  rOPINIRole (REQUIP) 2 MG tablet Take 2 mg by mouth daily with supper.    Yes Historical Provider, MD  sevelamer carbonate (RENVELA) 800 MG tablet Take 1,600 mg by mouth 3 (three) times daily with meals.   Yes Historical Provider, MD  sulfamethoxazole-trimethoprim (BACTRIM DS,SEPTRA DS) 800-160 MG per tablet Take 1 tablet by mouth at bedtime. 09/24/14 10/01/14 Yes Kelly Humes, PA-C  ondansetron (ZOFRAN) 4 MG tablet Take 1 tablet (4 mg total) by mouth every 8 (eight) hours as needed for nausea or vomiting. 09/30/14   Dahlia Bailiff, PA-C  prasugrel (EFFIENT) 10 MG TABS tablet Take 1 tablet (10 mg total) by mouth daily. 04/03/14   Liliane Shi, PA-C   BP 103/45 mmHg  Pulse 88  Temp(Src) 98.7 F (37.1 C) (Oral)  Resp 22  Ht 5'  8" (1.727 m)  Wt 279 lb 15.8 oz (127.001 kg)  BMI 42.58 kg/m2  SpO2 93% Physical Exam  Constitutional: He is oriented to person, place, and time. He appears well-developed and well-nourished. No distress.  HENT:  Head: Normocephalic and atraumatic.  Mouth/Throat: Oropharynx is clear and moist. No oropharyngeal exudate.  Eyes: Right eye exhibits no discharge. Left eye exhibits no discharge. No scleral icterus.  Neck: Normal range of motion.  Cardiovascular: Normal rate, regular rhythm and normal heart sounds.   No murmur heard. Pulmonary/Chest: Effort normal and breath sounds normal. No respiratory distress.  Abdominal: Soft. There is no tenderness.  Musculoskeletal: Normal range of motion. He exhibits no edema or tenderness.  Left sided BKA.  Mild erythema noted to the forearm, diffusely with several closed, wounds the process of healing. No induration, tenderness. No purulent discharge noted.  Neurological: He is alert and oriented to person, place, and time. No cranial nerve deficit. Coordination normal.  Skin: Skin is warm and dry. No rash noted. He is not diaphoretic.  Psychiatric: He has a normal mood and affect.  Nursing note and vitals reviewed.   ED Course  Procedures (including critical care time) Labs Review Labs Reviewed  CBC WITH DIFFERENTIAL/PLATELET - Abnormal; Notable for the following:    RBC 2.86 (*)    Hemoglobin 9.5 (*)    HCT 30.5 (*)    MCV 106.6 (*)    All other components within normal limits  COMPREHENSIVE METABOLIC PANEL - Abnormal; Notable for the following:    Sodium 134 (*)    Chloride 96 (*)    Glucose, Bld 235 (*)    BUN 26 (*)    Creatinine, Ser 6.73 (*)    Calcium 8.1 (*)    Albumin 2.8 (*)    ALT 16 (*)    GFR calc non Af Amer 9  (*)    GFR calc Af Amer 10 (*)    All other components within normal limits  CBG MONITORING, ED - Abnormal; Notable for the following:    Glucose-Capillary 244 (*)    All other components within normal limits  TROPONIN I  I-STAT CG4 LACTIC ACID, ED    Imaging Review Dg Chest 2 View  09/30/2014   CLINICAL DATA:  Hypotension.  Weakness.  EXAM: CHEST  2 VIEW  COMPARISON:  09/17/2014  FINDINGS: Lower lung volumes from prior exam which in combination with soft tissue attenuation from body habitus limits assessment. Increased right infrahilar markings. Cardiomegaly is grossly stable allowing for differences in technique. Question of increased bronchial thickening versus lower lung volumes. No large pleural effusion or pneumothorax.  IMPRESSION: Lower lung volumes with increased right infrahilar markings, likely atelectasis, and question of bronchial thickening.   Electronically Signed   By: Jeb Levering M.D.   On: 09/30/2014 01:43     EKG Interpretation None      MDM   Final diagnoses:  Hypotension    Patient here with hypotension status post hemodialysis earlier today. Patient states this has been ongoing issue over the past several weeks. Patient reports generalized malaise.  Lab work here is unremarkable for acute pathology, EKG without evidence of injury or ectopy. No leukocytosis. Labs appear to be at baseline for patient. Lactic acid within normal limits. Troponin negative.  Patient given fluid bolus total of 500 ML's based on hypotension [id number] systolic. Blood pressure did improve back to baseline for patient, orthostatic were negative. Patient was ambulated around the ED, and was noted to have  new oxygen requirement, oxygen saturation dropped to 88% with ambulation of approximately 30 feet. Patient does not use oxygen at home. Plan for admission for new oxygen requirement. Patient admitted to telemetry bed under observation under Dr. Hal Hope, MD.    Signed,  Dahlia Bailiff,  PA-C 3:52 AM     Dahlia Bailiff, PA-C 09/30/14 [place], MD 09/30/14 [phone]

## 2014-09-30 ENCOUNTER — Observation Stay (HOSPITAL_COMMUNITY): Payer: Medicare Other

## 2014-09-30 ENCOUNTER — Encounter (HOSPITAL_COMMUNITY): Payer: Self-pay | Admitting: Internal Medicine

## 2014-09-30 ENCOUNTER — Emergency Department (HOSPITAL_COMMUNITY): Payer: Medicare Other

## 2014-09-30 DIAGNOSIS — R42 Dizziness and giddiness: Secondary | ICD-10-CM

## 2014-09-30 DIAGNOSIS — I959 Hypotension, unspecified: Secondary | ICD-10-CM | POA: Diagnosis present

## 2014-09-30 DIAGNOSIS — Z992 Dependence on renal dialysis: Secondary | ICD-10-CM

## 2014-09-30 DIAGNOSIS — N186 End stage renal disease: Secondary | ICD-10-CM | POA: Diagnosis not present

## 2014-09-30 DIAGNOSIS — R110 Nausea: Secondary | ICD-10-CM

## 2014-09-30 DIAGNOSIS — R0902 Hypoxemia: Secondary | ICD-10-CM | POA: Diagnosis present

## 2014-09-30 LAB — GLUCOSE, CAPILLARY
Glucose-Capillary: 161 mg/dL — ABNORMAL HIGH (ref 65–99)
Glucose-Capillary: 137 mg/dL — ABNORMAL HIGH (ref 65–99)
Glucose-Capillary: 151 mg/dL — ABNORMAL HIGH (ref 65–99)

## 2014-09-30 LAB — COMPREHENSIVE METABOLIC PANEL
ALT: 16 U/L — ABNORMAL LOW (ref 17–63)
AST: 18 U/L (ref 15–41)
Albumin: 2.8 g/dL — ABNORMAL LOW (ref 3.5–5.0)
Alkaline Phosphatase: 94 U/L (ref 38–126)
Anion gap: 9 (ref 5–15)
BUN: 27 mg/dL — ABNORMAL HIGH (ref 6–20)
CO2: 32 mmol/L (ref 22–32)
Calcium: 8.9 mg/dL (ref 8.9–10.3)
Chloride: 96 mmol/L — ABNORMAL LOW (ref 101–111)
Creatinine, Ser: 8.43 mg/dL — ABNORMAL HIGH (ref 0.61–1.24)
GFR calc Af Amer: 8 mL/min — ABNORMAL LOW (ref >60)
GFR calc non Af Amer: 7 mL/min — ABNORMAL LOW (ref >60)
Glucose, Bld: 156 mg/dL — ABNORMAL HIGH (ref 65–99)
Potassium: 3.9 mmol/L (ref 3.5–5.1)
Sodium: 137 mmol/L (ref 135–145)
Total Bilirubin: 0.2 mg/dL — ABNORMAL LOW (ref 0.3–1.2)
Total Protein: 7.3 g/dL (ref 6.5–8.1)

## 2014-09-30 LAB — TROPONIN I
Troponin I: <0.03 ng/mL (ref <0.031)
Troponin I: <0.03 ng/mL (ref <0.031)

## 2014-09-30 LAB — CBC WITH DIFFERENTIAL/PLATELET
Basophils Absolute: 0 10*3/uL (ref 0.0–0.1)
Basophils Relative: 0 % (ref 0–1)
Eosinophils Absolute: 0.3 10*3/uL (ref 0.0–0.7)
Eosinophils Relative: 4 % (ref 0–5)
HCT: 30.4 % — ABNORMAL LOW (ref 39.0–52.0)
Hemoglobin: 9.6 g/dL — ABNORMAL LOW (ref 13.0–17.0)
Lymphocytes Relative: 24 % (ref 12–46)
Lymphs Abs: 1.7 10*3/uL (ref 0.7–4.0)
MCH: 33.3 pg (ref 26.0–34.0)
MCHC: 31.6 g/dL (ref 30.0–36.0)
MCV: 105.6 fL — ABNORMAL HIGH (ref 78.0–100.0)
Monocytes Absolute: 0.5 10*3/uL (ref 0.1–1.0)
Monocytes Relative: 7 % (ref 3–12)
Neutro Abs: 4.7 10*3/uL (ref 1.7–7.7)
Neutrophils Relative %: 65 % (ref 43–77)
Platelets: 217 10*3/uL (ref 150–400)
RBC: 2.88 MIL/uL — ABNORMAL LOW (ref 4.22–5.81)
RDW: 14.8 % (ref 11.5–15.5)
WBC: 7.1 10*3/uL (ref 4.0–10.5)

## 2014-09-30 LAB — CBG MONITORING, ED
Glucose-Capillary: 214 mg/dL — ABNORMAL HIGH (ref 65–99)
Glucose-Capillary: 164 mg/dL — ABNORMAL HIGH (ref 65–99)
Glucose-Capillary: 131 mg/dL — ABNORMAL HIGH (ref 65–99)

## 2014-09-30 LAB — LACTIC ACID, PLASMA: Lactic Acid, Venous: 1.4 mmol/L (ref 0.5–2.0)

## 2014-09-30 LAB — CORTISOL: Cortisol, Plasma: 5.4 ug/dL

## 2014-09-30 LAB — MRSA PCR SCREENING: MRSA by PCR: NEGATIVE

## 2014-09-30 LAB — I-STAT CG4 LACTIC ACID, ED: Lactic Acid, Venous: 1.34 mmol/L (ref 0.5–2.0)

## 2014-09-30 MED ORDER — FUROSEMIDE 20 MG PO TABS
80.0000 mg | ORAL_TABLET | Freq: Two times a day (BID) | ORAL | Status: DC
  Filled 2014-09-30: qty 4

## 2014-09-30 MED ORDER — SODIUM CHLORIDE 0.9 % IV BOLUS (SEPSIS)
250.0000 mL | Freq: Once | INTRAVENOUS | Status: AC
  Administered 2014-09-30: 250 mL via INTRAVENOUS

## 2014-09-30 MED ORDER — HYDROMORPHONE HCL 1 MG/ML IJ SOLN
1.0000 mg | Freq: Once | INTRAMUSCULAR | Status: AC
Start: 2014-09-30 — End: 2014-09-30
  Administered 2014-09-30: 1 mg via INTRAVENOUS
  Filled 2014-09-30: qty 1

## 2014-09-30 MED ORDER — DOCUSATE SODIUM 100 MG PO CAPS: 200.0000 mg | ORAL_CAPSULE | Freq: Three times a day (TID) | ORAL | Status: DC | PRN

## 2014-09-30 MED ORDER — ENOXAPARIN SODIUM 30 MG/0.3ML ~~LOC~~ SOLN
30.0000 mg | SUBCUTANEOUS | Status: DC
  Administered 2014-09-30 – 2014-10-02 (×3): 30 mg via SUBCUTANEOUS
  Filled 2014-09-30 (×5): qty 0.3

## 2014-09-30 MED ORDER — LORATADINE 10 MG PO TABS
10.0000 mg | ORAL_TABLET | Freq: Every day | ORAL | Status: DC
  Administered 2014-09-30 – 2014-10-03 (×4): 10 mg via ORAL
  Filled 2014-09-30 (×5): qty 1

## 2014-09-30 MED ORDER — PRASUGREL HCL 10 MG PO TABS
10.0000 mg | ORAL_TABLET | Freq: Every day | ORAL | Status: DC
  Administered 2014-09-30 – 2014-10-03 (×4): 10 mg via ORAL
  Filled 2014-09-30 (×5): qty 1

## 2014-09-30 MED ORDER — ONDANSETRON HCL 4 MG/2ML IJ SOLN
4.0000 mg | Freq: Four times a day (QID) | INTRAMUSCULAR | Status: DC | PRN
  Administered 2014-09-30 – 2014-10-03 (×4): 4 mg via INTRAVENOUS
  Filled 2014-09-30 (×4): qty 2

## 2014-09-30 MED ORDER — SEVELAMER CARBONATE 800 MG PO TABS
1600.0000 mg | ORAL_TABLET | Freq: Three times a day (TID) | ORAL | Status: DC
  Administered 2014-09-30 – 2014-10-02 (×8): 1600 mg via ORAL
  Filled 2014-09-30 (×13): qty 2

## 2014-09-30 MED ORDER — ALLOPURINOL 100 MG PO TABS
200.0000 mg | ORAL_TABLET | Freq: Every day | ORAL | Status: DC
  Administered 2014-09-30 – 2014-10-03 (×4): 200 mg via ORAL
  Filled 2014-09-30 (×4): qty 2

## 2014-09-30 MED ORDER — ONDANSETRON HCL 4 MG PO TABS: 4.0000 mg | ORAL_TABLET | Freq: Three times a day (TID) | ORAL | Status: DC | PRN

## 2014-09-30 MED ORDER — HYDROMORPHONE HCL 1 MG/ML IJ SOLN
1.0000 mg | INTRAMUSCULAR | Status: DC | PRN
  Administered 2014-09-30 – 2014-10-02 (×11): 1 mg via INTRAVENOUS
  Filled 2014-09-30 (×9): qty 1

## 2014-09-30 MED ORDER — CALCIUM ACETATE 667 MG PO CAPS: 667.0000 mg | ORAL_CAPSULE | ORAL | Status: DC

## 2014-09-30 MED ORDER — FUROSEMIDE 20 MG PO TABS
160.0000 mg | ORAL_TABLET | Freq: Two times a day (BID) | ORAL | Status: DC
Start: 2014-09-30 — End: 2014-09-30

## 2014-09-30 MED ORDER — CALCIUM ACETATE (PHOS BINDER) 667 MG PO CAPS
667.0000 mg | ORAL_CAPSULE | Freq: Two times a day (BID) | ORAL | Status: DC | PRN
  Filled 2014-09-30: qty 1

## 2014-09-30 MED ORDER — ATORVASTATIN CALCIUM 10 MG PO TABS
10.0000 mg | ORAL_TABLET | Freq: Every evening | ORAL | Status: DC
  Administered 2014-09-30 – 2014-10-02 (×3): 10 mg via ORAL
  Filled 2014-09-30 (×4): qty 1

## 2014-09-30 MED ORDER — HYDROMORPHONE HCL 2 MG PO TABS
2.0000 mg | ORAL_TABLET | Freq: Three times a day (TID) | ORAL | Status: DC | PRN
  Administered 2014-09-30: 2 mg via ORAL
  Filled 2014-09-30: qty 1

## 2014-09-30 MED ORDER — ROPINIROLE HCL 1 MG PO TABS: 2.0000 mg | ORAL_TABLET | Freq: Every day | ORAL | Status: DC

## 2014-09-30 MED ORDER — ACETAMINOPHEN 650 MG RE SUPP: 650.0000 mg | Freq: Four times a day (QID) | RECTAL | Status: DC | PRN

## 2014-09-30 MED ORDER — ONDANSETRON HCL 4 MG PO TABS
4.0000 mg | ORAL_TABLET | Freq: Four times a day (QID) | ORAL | Status: DC | PRN
  Administered 2014-09-30: 4 mg via ORAL
  Filled 2014-09-30: qty 1

## 2014-09-30 MED ORDER — FUROSEMIDE 20 MG PO TABS
80.0000 mg | ORAL_TABLET | Freq: Two times a day (BID) | ORAL | Status: DC
  Administered 2014-09-30: 80 mg via ORAL

## 2014-09-30 MED ORDER — HYDROMORPHONE HCL 1 MG/ML IJ SOLN
INTRAMUSCULAR | Status: AC
  Filled 2014-09-30: qty 1

## 2014-09-30 MED ORDER — INSULIN PUMP
Freq: Three times a day (TID) | SUBCUTANEOUS | Status: DC
  Administered 2014-09-30: 23:00:00 via SUBCUTANEOUS
  Administered 2014-09-30: 3.5 via SUBCUTANEOUS
  Administered 2014-10-01 – 2014-10-03 (×9): via SUBCUTANEOUS
  Filled 2014-09-30: qty 1

## 2014-09-30 MED ORDER — LEVOTHYROXINE SODIUM 100 MCG PO TABS
100.0000 ug | ORAL_TABLET | Freq: Every day | ORAL | Status: DC
  Administered 2014-10-02: 100 ug via ORAL
  Filled 2014-09-30 (×4): qty 1

## 2014-09-30 MED ORDER — RENA-VITE PO TABS
1.0000 | ORAL_TABLET | Freq: Every day | ORAL | Status: DC
  Administered 2014-09-30 – 2014-10-03 (×4): 1 via ORAL
  Filled 2014-09-30 (×6): qty 1

## 2014-09-30 MED ORDER — PROMETHAZINE HCL 25 MG PO TABS
25.0000 mg | ORAL_TABLET | Freq: Four times a day (QID) | ORAL | Status: DC | PRN
  Administered 2014-09-30: 25 mg via ORAL
  Filled 2014-09-30: qty 1

## 2014-09-30 MED ORDER — ASPIRIN 81 MG PO TABS: 81.0000 mg | ORAL_TABLET | Freq: Every evening | ORAL | Status: DC

## 2014-09-30 MED ORDER — ASPIRIN EC 81 MG PO TBEC
81.0000 mg | DELAYED_RELEASE_TABLET | Freq: Every evening | ORAL | Status: DC
  Administered 2014-10-01 – 2014-10-02 (×2): 81 mg via ORAL
  Filled 2014-09-30 (×5): qty 1

## 2014-09-30 MED ORDER — DIPHENHYDRAMINE HCL 25 MG PO CAPS
25.0000 mg | ORAL_CAPSULE | Freq: Four times a day (QID) | ORAL | Status: DC | PRN
  Administered 2014-10-02: 25 mg via ORAL
  Filled 2014-09-30: qty 1

## 2014-09-30 MED ORDER — ROPINIROLE HCL 1 MG PO TABS
2.0000 mg | ORAL_TABLET | Freq: Every day | ORAL | Status: DC
  Administered 2014-09-30 – 2014-10-02 (×3): 2 mg via ORAL
  Filled 2014-09-30 (×4): qty 2

## 2014-09-30 MED ORDER — NITROGLYCERIN 0.4 MG SL SUBL: 0.4000 mg | SUBLINGUAL_TABLET | SUBLINGUAL | Status: DC | PRN

## 2014-09-30 MED ORDER — ISOSORBIDE MONONITRATE ER 30 MG PO TB24
30.0000 mg | ORAL_TABLET | Freq: Every day | ORAL | Status: DC
  Filled 2014-09-30: qty 1

## 2014-09-30 MED ORDER — METOPROLOL SUCCINATE ER 25 MG PO TB24
12.5000 mg | ORAL_TABLET | Freq: Every day | ORAL | Status: DC
  Administered 2014-09-30: 12.5 mg via ORAL
  Filled 2014-09-30: qty 1

## 2014-09-30 MED ORDER — INSULIN ASPART 100 UNIT/ML ~~LOC~~ SOLN
0.0000 [IU] | Freq: Three times a day (TID) | SUBCUTANEOUS | Status: DC
Start: 2014-09-30 — End: 2014-10-01
  Filled 2014-09-30: qty 1

## 2014-09-30 MED ORDER — METOCLOPRAMIDE HCL 5 MG PO TABS
5.0000 mg | ORAL_TABLET | Freq: Three times a day (TID) | ORAL | Status: DC
  Administered 2014-09-30 – 2014-10-03 (×11): 5 mg via ORAL
  Filled 2014-09-30 (×17): qty 1

## 2014-09-30 MED ORDER — ACETAMINOPHEN 325 MG PO TABS: 650.0000 mg | ORAL_TABLET | Freq: Four times a day (QID) | ORAL | Status: DC | PRN

## 2014-09-30 MED ORDER — CALCIUM ACETATE (PHOS BINDER) 667 MG PO CAPS
1334.0000 mg | ORAL_CAPSULE | Freq: Three times a day (TID) | ORAL | Status: DC
  Administered 2014-09-30 – 2014-10-02 (×8): 1334 mg via ORAL
  Filled 2014-09-30 (×12): qty 2

## 2014-09-30 MED ORDER — ONDANSETRON HCL 4 MG/2ML IJ SOLN
4.0000 mg | Freq: Once | INTRAMUSCULAR | Status: AC
  Administered 2014-09-30: 4 mg via INTRAVENOUS
  Filled 2014-09-30: qty 2

## 2014-09-30 MED ORDER — SODIUM CHLORIDE 0.9 % IJ SOLN
3.0000 mL | Freq: Two times a day (BID) | INTRAMUSCULAR | Status: DC
  Administered 2014-09-30 – 2014-10-02 (×6): 3 mL via INTRAVENOUS
  Filled 2014-09-30: qty 3

## 2014-09-30 NOTE — H&P (Signed)
[facility] History and Physical  ALMO LECRONE [id number] DOB: 1965/11/26 DOA: 09/29/2014  Referring physician: [id number]. PCP: Windy Kalata, MD  Specialists: Dr.MacAlhany. Cardiologist.  Chief Complaint: Dizziness. Low blood pressure.  HPI: Dale [name] is a 49 y.o. male with history of ESRD on hemodialysis, CAD status post stenting, chronic anemia, hypertension presents to the ER because of low blood pressure. Patient states he had low blood pressure yesterday while having dialysis but felt okay and went home. Later in the evening patient was feeling dizzy and when he measured his blood pressure was stable around 80 systolic and he came to the ER. In the ER patient was given 2 50 mL normal saline bolus following which patient's blood pressure improved but when patient was made to walk patient was still having low blood pressure. Patient has been admitted for further observation. Patient denies any chest pain shortness of breath but has some nausea when he tries to eat. Abdomen appears benign. Patient also was found to be mildly hypoxic in the ER requiring oxygen.  Review of Systems: As presented in the history of presenting illness, rest negative.  Past Medical History  Diagnosis Date  . Hypertension   . History of blood transfusion     "related to dialysis"  . Dyspnea     a. 07/23/11 Echo: EF 55-60%  . Anemia of chronic disease   . Cholecystitis     a. 08/27/2011  . Constipation   . Pericardial effusion     a.  Small by CT 08/22/11;  b.  Large by CT 08/27/11  . Inguinal lymphadenopathy     a. bilateral - s/p biopsy 07/2011  . Fibromyalgia   . Hx of echocardiogram     Echo (11/15):  Mild LVH, EF 60-65%, no RWMA, Gr 1 DD, MAC, mild LAE, normal RVF, mild RAE  . Hx of cardiovascular stress test     Lexiscan Myoview (11/15):  Inf, apical cap and apical lateral ischemia, EF 56%, inf HK, Moderate Risk  . High cholesterol   . Pneumonia ~ 2007  . OSA (obstructive sleep apnea)      "I'm not doing the mask; claustrophobic"  . Claustrophobia   . Hypothyroidism   . Insulin dependent diabetes mellitus   . GERD (gastroesophageal reflux disease)   . Arthritis     "hands, right knee" (03/19/2014)  . ESRD (end stage renal disease)     a. 1995 s/p cadaveric transplant w/ susbequent failure after 18 yrs;  b.Dialysis initiated 07/2011  . CAD (coronary artery disease)     a. LHC (11/15):  mid to dist Dx 80%, mid RCA 99% (functional CTO), dist RCA 80% >> PCI: balloon angioplasty to mid RCA (could not deliver stent) - plan staged PCI with HSRA of RCA; Dx to be tx medically    Past Surgical History  Procedure Laterality Date  . Kidney transplant  04/25/1993  . Cataract extraction w/ intraocular lens  implant, bilateral Bilateral 1990's  . Arteriovenous graft placement Left 1993?    forearm  . Arteriovenous graft placement Right 1993?    leg  . Toe amputation Bilateral after 2005  . Below knee leg amputation Left 2010  . Carpal tunnel release Bilateral after 2005  . Shoulder arthroscopy Left ~ 2006    frozen  . Av fistula placement  07/29/2011    Procedure: ARTERIOVENOUS (AV) FISTULA CREATION;  Surgeon: Mal Misty, MD;  Location: [place];  Service: Vascular;  Laterality: Right;  Brachial cephalic  .  Insertion of dialysis catheter  08/01/2011    Procedure: INSERTION OF DIALYSIS CATHETER;  Surgeon: Rosetta Posner, MD;  Location: [place];  Service: Vascular;  Laterality: Right;  insertion of dialysis catheter on right internal jugular vein  . Lymph node biopsy  08/10/2011    Procedure: LYMPH NODE BIOPSY;  Surgeon: Merrie Roof, MD;  Location: [place];  Service: General;  Laterality: Left;  left groin lymph biopsy  . Colonoscopy  08/29/2011    Procedure: COLONOSCOPY;  Surgeon: Jerene Bears, MD;  Location: [place];  Service: Gastroenterology;  Laterality: N/A;  . Appendectomy  ~ 2006  . Hernia repair    . Umbilical hernia repair  1990's  . Cardiac catheterization  03/19/2014  .  Av fistula placement Left 1993?    "attempted one in my wrist; didn't take"  . Neuroplasty / transposition ulnar nerve at elbow Left after 2005  . Venogram Right 07/28/2011    Procedure: VENOGRAM;  Surgeon: Conrad Hume, MD;  Location: [facility] CATH LAB;  Service: Cardiovascular;  Laterality: Right;  . Left heart catheterization with coronary angiogram N/A 03/19/2014    Procedure: LEFT HEART CATHETERIZATION WITH CORONARY ANGIOGRAM;  Surgeon: Burnell Blanks, MD;  [facility] C[facility] LAB;  Service: Cardiovascular;  Laterality: N/A;  . Cardiac catheterization  03/19/2014    Procedure: CORONARY BALLOON ANGIOPLASTY;  Surgeon: Burnell Blanks, MD;  Location: [facility] CATH LAB;  Service: Cardiovascular;;  . Coronary angioplasty with stent placement  04/09/2014        ptca/des mid lad   . Angioplasty  04/09/2014    RCA   . Femoral artery repair  04/09/2014    ANGIOSEAL  . Eye surgery    . Percutaneous coronary rotoblator intervention (pci-r) N/A 04/09/2014    Procedure: PERCUTANEOUS CORONARY ROTOBLATOR INTERVENTION (PCI-R);  Surgeon: Burnell Blanks, MD;  Location: [facility] CATH LAB;  Service: Cardiovascular;  Laterality: N/A;   Social History:  reports that he has never smoked. He has never used smokeless tobacco. He reports that he drinks alcohol. He reports that he does not use illicit drugs. Where does patient live at home. Can patient participate in ADLs? Yes.  Allergies  Allergen Reactions  . Doxycycline     RASH  . Hydrocodone     RASH  . Morphine And Related Hives and Nausea Only  . Amoxicillin Other (See Comments)    Reaction unknown  . Ciprofloxacin Other (See Comments)    Reaction unknown  . Clindamycin/Lincomycin Other (See Comments)    Reaction unknown  . Codeine Other (See Comments)    Reaction unknown. But pt has tolerated hydrocodone in Vicodin and Norco in the past  . Levofloxacin Other (See Comments)    Reaction unknown  . Rocephin [Ceftriaxone] Itching  . Vasotec  Cough  . Contrast Media [Iodinated Diagnostic Agents] Rash  . Oxycodone Rash  . Plavix [Clopidogrel Bisulfate] Rash    Family History:  Family History  Problem Relation Age of Onset  . Malignant hyperthermia Neg Hx   . Colon polyps Father   . Diabetes Father   . Cancer Father   . Breast cancer Maternal Aunt   . Cancer Maternal Aunt     Breast and Bone  . Heart attack Neg Hx   . Stroke Neg Hx   . Hypertension Mother   . Hypertension Father   . Hypertension Father       Prior to Admission medications   Medication Sig Start Date End Date  Taking? Authorizing Provider  allopurinol (ZYLOPRIM) 100 MG tablet Take 200 mg by mouth daily.  03/14/12  Yes Historical Provider, MD  aspirin 81 MG tablet Take 81 mg by mouth every evening.    Yes Historical Provider, MD  atorvastatin (LIPITOR) 10 MG tablet Take 10 mg by mouth every evening.    Yes Historical Provider, MD  calcium acetate (PHOSLO) 667 MG capsule Take 667-1,334 mg by mouth See admin instructions. Take 1,334 mg (2 capsules) with meals and 667 (1 capsule) with snacks 09/19/12  Yes Historical Provider, MD  cephALEXin (KEFLEX) 500 MG capsule Take 1 capsule (500 mg total) by mouth 3 (three) times daily. 09/24/14  Yes Antonietta Breach, PA-C  diphenhydrAMINE (BENADRYL) 25 mg capsule Take 1-2 capsules (25-50 mg total) by mouth every 6 (six) hours as needed for itching. 04/11/14  Yes Rhonda G Barrett, PA-C  docusate sodium (COLACE) 100 MG capsule Take 200 mg by mouth 3 (three) times daily as needed for mild constipation.    Yes Historical Provider, MD  furosemide (LASIX) 80 MG tablet Take 2 tablets (160 mg total) by mouth 2 (two) times daily. OK to take if you are still urinating and it is helpful. 04/11/14  Yes Rhonda G Barrett, PA-C  GLUCAGON EMERGENCY 1 MG injection Inject 1 mg into the muscle daily as needed (low blood sugar).  01/19/12  Yes Historical Provider, MD  HYDROmorphone (DILAUDID) 2 MG tablet Take 2 mg by mouth 3 (three) times daily as  needed for severe pain.  09/03/12  Yes Historical Provider, MD  Insulin Human (INSULIN PUMP) 100 unit/ml SOLN Inject into the skin continuous. Humalog   Yes Historical Provider, MD  isosorbide mononitrate (IMDUR) 30 MG 24 hr tablet Take 1 tablet (30 mg total) by mouth daily. 03/06/14  Yes Scott Joylene Draft, PA-C  levothyroxine (SYNTHROID, LEVOTHROID) 100 MCG tablet Take 100 mcg by mouth daily before breakfast.   Yes Historical Provider, MD  loratadine (CLARITIN) 10 MG tablet Take 10 mg by mouth daily.   Yes Historical Provider, MD  metoCLOPramide (REGLAN) 5 MG tablet Take 5 mg by mouth 4 (four) times daily - after meals and at bedtime. 05/26/11  Yes Historical Provider, MD  metoprolol succinate (TOPROL-XL) 25 MG 24 hr tablet Take 0.5 tablets (12.5 mg total) by mouth daily. 03/20/14  Yes Liliane Shi, PA-C  multivitamin (RENA-VIT) TABS tablet Take 1 tablet by mouth daily.   Yes Historical Provider, MD  nitroGLYCERIN (NITROSTAT) 0.4 MG SL tablet Place 0.4 mg under the tongue every 5 (five) minutes as needed for chest pain.    Yes [name], MD  promethazine (PHENERGAN) 25 MG tablet Take 25 mg by mouth Every 6 hours as needed for nausea or vomiting. For nausea 07/13/11  Yes Historical Provider, MD  rOPINIRole (REQUIP) 2 MG tablet Take 2 mg by mouth daily with supper.    Yes Historical Provider, MD  sevelamer carbonate (RENVELA) 800 MG tablet Take 1,600 mg by mouth 3 (three) times daily with meals.   Yes Historical Provider, MD  sulfamethoxazole-trimethoprim (BACTRIM DS,SEPTRA DS) 800-160 MG per tablet Take 1 tablet by mouth at bedtime. 09/24/14 10/01/14 Yes Kelly Humes, PA-C  ondansetron (ZOFRAN) 4 MG tablet Take 1 tablet (4 mg total) by mouth every 8 (eight) hours as needed for nausea or vomiting. 09/30/14   Dahlia Bailiff, PA-C  prasugrel (EFFIENT) 10 MG TABS tablet Take 1 tablet (10 mg total) by mouth daily. 04/03/14   Liliane Shi, PA-C  Physical Exam: Filed Vitals:   09/30/14 0315 09/30/14 0430  09/30/14 0500 09/30/14 0530  BP: 115/49 116/46 101/46 104/47  Pulse: 94 86 84 81  Temp:      TempSrc:      Resp: 19 21    Height:      Weight:      SpO2: 94% 95% 99% 100%     General:  Moderately built and nourished.  Eyes: Anicteric mild pallor.  ENT: No discharge from the ears eyes nose and mouth.  Neck: No mass felt. No JVD appreciated.  Cardiovascular: S1 and S2 heard.  Respiratory: No rhonchi or crepitations.  Abdomen: Soft nontender bowel sounds present.  Skin: No rash.  Musculoskeletal: No edema.  Psychiatric: Appears normal.  Neurologic: Alert awake oriented to time place and person. Moves all estimate is 5 x 5.  Labs on Admission:  Basic Metabolic Panel:  Recent Labs Lab 09/23/14 1957 09/29/14 2230  NA 138 134*  K 4.6 3.8  CL 94* 96*  CO2 30 27  GLUCOSE 79 235*  BUN 41* 26*  CREATININE 9.39* 6.73*  CALCIUM 9.2 8.1*   Liver Function Tests:  Recent Labs Lab 09/29/14 2230  AST 19  ALT 16*  ALKPHOS 92  BILITOT 0.5  PROT 6.6  ALBUMIN 2.8*   No results for input(s): LIPASE, AMYLASE in the last 168 hours. No results for input(s): AMMONIA in the last 168 hours. CBC:  Recent Labs Lab 09/23/14 1957 09/29/14 2230  WBC 6.8 8.8  NEUTROABS 4.5 6.1  HGB 10.0* 9.5*  HCT 31.3* 30.5*  MCV 105.4* 106.6*  PLT 264 151   Cardiac Enzymes:  Recent Labs Lab 09/30/14 0002  TROPONINI <0.03    BNP (last 3 results) No results for input(s): BNP in the last 8760 hours.  ProBNP (last 3 results) No results for input(s): PROBNP in the last 8760 hours.  CBG:  Recent Labs Lab 09/29/14 2252  GLUCAP 244*    Radiological Exams on Admission: Dg Chest 2 View  09/30/2014   CLINICAL DATA:  Hypotension.  Weakness.  EXAM: CHEST  2 VIEW  COMPARISON:  09/17/2014  FINDINGS: Lower lung volumes from prior exam which in combination with soft tissue attenuation from body habitus limits assessment. Increased right infrahilar markings. Cardiomegaly is grossly  stable allowing for differences in technique. Question of increased bronchial thickening versus lower lung volumes. No large pleural effusion or pneumothorax.  IMPRESSION: Lower lung volumes with increased right infrahilar markings, likely atelectasis, and question of bronchial thickening.   Electronically Signed   By: Jeb Levering M.D.   On: 09/30/2014 01:43    EKG: Independently reviewed. Normal sinus rhythm with prolonged QT at 521 ms.  Assessment/Plan Principal Problem:   Dizziness Active Problems:   Hypoxia   Hypotension   ESRD (end stage renal disease) on dialysis   1. Dizziness with low blood pressure - dizziness most likely from hypotension. Probably secondary to fluid imbalance after dialysis. Patient did receive 250 mL normal saline in the ER. At this time we'll hold antihypertensives and closely observe and recheck orthostatics in a.m. 2. ESRD on hemodialysis on Monday Wednesday and Friday - patient did receive 250 mL bolus in the ER. Patient was mildly hypoxic. May consult nephrology to see if patient requires dialysis. 3. Nausea - patient was feeling nauseated. I have ordered abdominal sonogram. Abdomen appears benign on exam. 4. CAD status post stenting - denies any chest pain. 5. Chronic anemia - follow CBC.   DVT Prophylaxis  Lovenox.  Code Status: Full code.  Family Communication: Discussed with patient.  Disposition Plan: Admit for observation.    Lenora Gomes N. [facility] Pager [phone].  If 7PM-7AM, please contact night-coverage [email or web address] Password [place] 09/30/2014, 5:48 AM

## 2014-09-30 NOTE — ED Notes (Signed)
Ambulated pt in hall with Pulse Ox. While in the bed Pt's O2 started at 98%. While ambulating pt his O2 dropped down to 86%. Pt also stated he felt short of breath while walking. Walked with pt back to the bed. Pt hooked back up to monitor. After 36minutes patient's O2 is currently 96%.

## 2014-09-30 NOTE — ED Notes (Signed)
Spoke to [employer], patient not interested in PO challenge at this time.

## 2014-09-30 NOTE — ED Notes (Signed)
Renal/carb modified diet with fluid restriction diet ordered for patients breakfast tray.

## 2014-09-30 NOTE — ED Notes (Signed)
MD at bedside. 

## 2014-09-30 NOTE — ED Notes (Signed)
Patient is currently dry heaving.

## 2014-09-30 NOTE — ED Notes (Signed)
Ultrasound called to report approximately 30 minute delay.

## 2014-09-30 NOTE — ED Notes (Signed)
Spoke with MD Doyle Askew about pt lasix 160 mg po. Ordered to cut in half to 80 mg twice a day.

## 2014-09-30 NOTE — ED Notes (Signed)
Hospital Bed orderd @0433 

## 2014-09-30 NOTE — ED Notes (Signed)
Joe, PA-C, at the bedside.

## 2014-09-30 NOTE — ED Notes (Signed)
Called main lab, spoke to paula, in regards to troponin results. Received at 0112, currently processing.

## 2014-09-30 NOTE — ED Notes (Signed)
Joe, pa-c, at the bedside during orthostatics.

## 2014-09-30 NOTE — ED Notes (Signed)
Discussed plan of care with Joe, PA-C. Prepare for x-ray, and add on troponin.

## 2014-09-30 NOTE — ED Notes (Signed)
Wound to right forearm rebandaged with telfa bandage and gauze covering.

## 2014-09-30 NOTE — ED Notes (Signed)
Wife, Phill Mutter, cell [phone], and she states as soon as she gets home she's turning off the cell. the house number is [phone].

## 2014-09-30 NOTE — ED Notes (Signed)
Patient ate minimum of 1 graham cracker. Reports feeling "just a hair better".

## 2014-09-30 NOTE — Progress Notes (Addendum)
Inpatient Diabetes Program Recommendations  AACE/ADA: New Consensus Statement on Inpatient Glycemic Control (2013)  Target Ranges:  Prepandial:   less than 140 mg/dL      Peak postprandial:   less than 180 mg/dL (1-2 hours)      Critically ill patients:  140 - 180 mg/dL   Spoke with patient regarding ability to use insulin pump for glucose control at this time while in hospital. Pt uses his insulin pump with no problems-he knows his basal and bolus rates which he states are still the same as documented during last visit last month.  He has the "wizard setting" for correction and meal coverage. He states he does not need an additional sensitive correction scale while using his insulin pump. Text paged Dr Doyle Askew to request the sensitive correction order to be discontinued. Dr Doyle Askew returned page. Pump settings are as follows:   Basal rates: 2400-0300 - 1.5 units/hr            0300 -0800 -  1.75 units/hr            0800-1200 - 1.45 units/hr            1200 - 2400 - 1.625 units/hr Total daily basal dose of 38.55 units.  Target glucose level: 120-120 mg/dL  Bolus: Correction (sensitivity) factor(s): 1200 am-4:00 pm: 1 unit for every 20 mg/dL greater than goal glucose of 120 mg/dL           4:00 pm to 12:00 am: 1 unit for every 25 mg/dL greater than goal glucose of 120 mg/dL  Insulin to CHO ratio: 12:00 am to 4:00 pm: 1 unit for each 7 units cho             4:00 pm to 12:00 am: 1 unit for each 8 units cho.  Pt prefers to remain on his insulin pump while hospitalized and able to use.  Thank you Rosita Kea, RN, MSN, CDE  Diabetes Inpatient Program Office: [phone] Pager: [phone] 8:00 am to 5:00 pm

## 2014-09-30 NOTE — ED Notes (Signed)
Provided snack as PO challenge, patient hesitant, stating he doesn't have an appetite.

## 2014-09-30 NOTE — Progress Notes (Addendum)
Please note that patient has been admitted after midnight, please see earlier admission note by Dr.Kakrakandy. This is the addendum to the admission note.  Patient is 49 year old male with known end-stage renal disease on hemodialysis, CAD status post stenting, anemia of chronic kidney disease, HTN, presented to [facility] after he was in hemodialysis session 09/29/2014 and had hypotension during dialysis. Systolic blood pressure reported to be in 80s. Patient was referred for an admission. At the time of the admission patient reports nausea but no vomiting, dizziness with exertion denies fevers and chills.  Per admitting physician, dizziness thought to be related to hypotension. Patient has received 250 mL normal saline in emergency department. Anti-hypertensive regimen has been on hold (lasix, imdur, metoprolol). Resume one medication at the time as BP able to tolerate. Orthostatic vitals to be checked this morning, we'll notify nephrology team of patient's admission. Repeat renal panel and CBC in the morning.  Faye Ramsay, MD  [facility] Pager [phone]  If 7PM-7AM, please contact night-coverage [email or web address] Password [email or web address]

## 2014-09-30 NOTE — Progress Notes (Addendum)
09/30/2014 2:15 PM  Pt admitted to room from ED.  Pt alert and oriented, denies pain.  Full assessment to EPIC.  Pt placed on telemetry box 15 and CCMD notified.  Vitals stable.  Pt is on an insulin pump--orders received to continue per home regimen.  Patient signed contract (copy placed in chart) and a carb counting flowsheet was placed at his bedside.  Pt verbalized understanding of insulin pump management.  Diabetes coordinator also paged per protocol.  Skin assessment reveals two lacerations to the right forearm that the patient states he sustained during a fall his last admission.  Foam applied to these areas.  RLE discolored but intact.  Right first and fifth toes amputated, pt has a left BKA with prosthesis at the bedside.  Skin otherwise intact.  Pt placed on contact isolation for history of MRSA, PCR sent to lab.  Pt from home alone.  Admission nurse at bedside to complete history.  Pt is a high fall risk per assessment tool, interventions in place and patient verbalized understanding.  Will monitor. Princella Pellegrini

## 2014-09-30 NOTE — Discharge Instructions (Signed)
Contact your nephrologist tomorrow in regard to your visit tonight. Follow-up with your nephrologist. Return to the ER with any severe dizziness, severe weakness, chest pain, shortness of breath, high fever, nausea, vomiting, worsening symptoms.   Hypotension As your heart beats, it forces blood through your arteries. This force is your blood pressure. If your blood pressure is too low for you to go about your normal activities or to support the organs of your body, you have hypotension. Hypotension is also referred to as low blood pressure. When your blood pressure becomes too low, you may not get enough blood to your brain. As a result, you may feel weak, feel lightheaded, or develop a rapid heart rate. In a more severe case, you may faint. CAUSES Various conditions can cause hypotension. These include:  Blood loss.  Dehydration.  Heart or endocrine problems.  Pregnancy.  Severe infection.  Not having a well-balanced diet filled with needed nutrients.  Severe allergic reactions (anaphylaxis). Some medicines, such as blood pressure medicine or water pills (diuretics), may lower your blood pressure below normal. Sometimes taking too much medicine or taking medicine not as directed can cause hypotension. TREATMENT  Hospitalization is sometimes required for hypotension if fluid or blood replacement is needed, if time is needed for medicines to wear off, or if further monitoring is needed. Treatment might include changing your diet, changing your medicines (including medicines aimed at raising your blood pressure), and use of support stockings. HOME CARE INSTRUCTIONS   Drink enough fluids to keep your urine clear or pale yellow.  Take your medicines as directed by your health care provider.  Get up slowly from reclining or sitting positions. This gives your blood pressure a chance to adjust.  Wear support stockings as directed by your health care provider.  Maintain a healthy diet by  including nutritious food, such as fruits, vegetables, nuts, whole grains, and lean meats. SEEK MEDICAL CARE IF:  You have vomiting or diarrhea.  You have a fever for more than 2-3 days.  You feel more thirsty than usual.  You feel weak and tired. SEEK IMMEDIATE MEDICAL CARE IF:   You have chest pain or a fast or irregular heartbeat.  You have a loss of feeling in some part of your body, or you lose movement in your arms or legs.  You have trouble speaking.  You become sweaty or feel lightheaded.  You faint. MAKE SURE YOU:   Understand these instructions.  Will watch your condition.  Will get help right away if you are not doing well or get worse. Document Released: 04/11/2005 Document Revised: 01/30/2013 Document Reviewed: 10/12/2012 [name] Patient Information 2015 [place], [place]. This information is not intended to replace advice given to you by your health care provider. Make sure you discuss any questions you have with your health care provider.   Emergency Department Resource Guide 1) Find a Doctor and Pay Out of Pocket Although you won't have to find out who is covered by your insurance plan, it is a good idea to ask around and get recommendations. You will then need to call the office and see if the doctor you have chosen will accept you as a new patient and what types of options they offer for patients who are self-pay. Some doctors offer discounts or will set up payment plans for their patients who do not have insurance, but you will need to ask so you aren't surprised when you get to your appointment.  2) Contact Your Local Health  Department Not all health departments have doctors that can see patients for sick visits, but many do, so it is worth a call to see if yours does. If you don't know where your local health department is, you can check in your phone book. The CDC also has a tool to help you locate your state's health department, and many state websites also  have listings of all of their local health departments.  3) Find a [facility] If your illness is not likely to be very severe or complicated, you may want to try a walk in clinic. These are popping up all over the country in pharmacies, drugstores, and shopping centers. They're usually staffed by nurse practitioners or physician assistants that have been trained to treat common illnesses and complaints. They're usually fairly quick and inexpensive. However, if you have serious medical issues or chronic medical problems, these are probably not your best option.  No Primary Care Doctor: - Call Health Connect at  [phone] - they can help you locate a primary care doctor that  accepts your insurance, provides certain services, etc. - Physician Referral Service- [phone]  Chronic Pain Problems: Organization         Address  Phone   Notes  [facility]  [phone] Patients need to be referred by their primary care doctor.   Medication Assistance: Organization         Address  Phone   Notes  [facility] Medication [facility] [address] [place], [place] [address] [phone] --Must be a resident of [facility] -- Must have NO insurance coverage whatsoever (no Medicaid/ Medicare, etc.) -- The pt. MUST have a primary care doctor that directs their care regularly and follows them in the community   Med[facility]ist  [phone]   [employer]  [phone]    Agencies that provide inexpensive medical care: Organization         Address  Phone   Notes  [place]  [phone]   [facility]    [phone]   [facility] [place], [place] [address] [phone]   [place] [address] [place] [phone]   Planned Parenthood    [phone]   [facility]    [phone]   [place] and [place] [address], [place] Phone:  [phone], Fax:  [phone] Hours of Operation:  9 am - 6 pm, M-F.  Also accepts Medicaid/Medicare and self-pay.  [facility] for [place] [address], [address], [place] Phone: [phone], Fax: [phone]. Hours of Operation:  8:30 am - 5:30 pm, M-F.  Also accepts Medicaid and self-pay.  [facility] [address] [place] Phone: [phone]   [place], [place], [place] [phone], Ext. [phone] Mondays & Thursdays: 7-9 AM.  First 15 patients are seen on a first come, first serve basis.    Learned Providers:  Organization         Address  Phone   Notes  [facility] [address], [place] [phone] Also accepts self-pay patients.  [place], [place]  [phone]   [place], [place], [place] [phone]  [place] [address] [place] [phone]   Lucianne Lei [address], [place]   [phone] Only accepts [place] Access [place] patients after they have their name applied to their card.   Self-Pay (no insurance) in [facility]:  Organization         Address  Phone   Notes  Sickle Cell Patients, [facility] [place] [phone]   [facility] [place] [phone]   [facility] [place]  [place], [place], [place] [phone]   [facility]/Dr. Osei-Bonsu  [address], [place] or [address], [place] [phone] Phone number for both [place] and [place] locations is the same.  [facility] [address] [place] [phone]   [facility] [address],  [place] or [address] [phone]   [facility] [address], [place] [phone], phone; [phone], fax Sees patients 1st and 3rd Saturday of every month.  Must not qualify for public or private insurance (i.e. Medicaid, Medicare, [place] Health Choice, Veterans' Benefits)  Household income should be no more than 200% of the poverty level The clinic cannot treat you if you are pregnant or think you are pregnant  Sexually transmitted diseases are not treated at the clinic.    Dental Care: Organization         Address  Phone  Notes  [facility] [place] [phone] Accepts children up to age 14 who are enrolled in [place] or [place]; pregnant women with a Medicaid card; and children who have applied for Medicaid or [place] Health Choice, but were declined, whose parents can pay a reduced fee at time of service.  [facility] of [facility]  [address], [place] [phone] Accepts children up to age 61 who are enrolled in [place] or [place]; pregnant women with a Medicaid card; and children who have applied for Medicaid or [place] Health Choice, but were declined, whose parents can pay a reduced fee at time of service.  [place] Adult [facility] Access PROGRAM  [place] [phone] Patients are seen by appointment only. Walk-ins are not accepted. [place] will see patients 20 years of age and older. Monday - Tuesday (8am-5pm) Most Wednesdays (8:30-5pm) $30 per visit, cash only  [facility] Adult Dental Access PROGRAM  [address], [facility] [phone] Patients are seen by appointment only. Walk-ins are not accepted. [place] will see patients 61 years of age and older. One Wednesday Evening (Monthly: Volunteer Based).  $30 per visit, cash only  [place]  [phone] for adults; Children under age 8, call Graduate Pediatric Dentistry at [phone]. Children aged 28-14, please call [phone] to request a pediatric application.  Dental services are provided in all areas of dental care including fillings, crowns and bridges, complete and partial dentures, implants, gum treatment, root canals, and extractions. Preventive care is also provided. Treatment is provided to both adults and children. Patients are selected via a lottery and there is often a waiting list.   [facility] [address] [place]  [phone] [email or web address]  [facility] [address] [place], [place] [phone], Ext. [phone] Second and Fourth Thursday of each month, opens at 6:30 AM; Clinic ends at 9 AM.  Patients are seen on a first-come first-served basis, and a limited number are seen during each clinic.   [facility]  [address] Hillard Danker [place], [place] [phone]   Eligibility Requirements You must have lived in [place], [place], or [place] counties for at least the last three months.   You cannot be eligible for state or federal sponsored [employer], including [employer], [place], or [employer].   You generally cannot be eligible for healthcare insurance through your employer.    How to apply: Eligibility screenings are held every Tuesday and Wednesday afternoon from 1:00 pm until 4:00 pm. You do not need an appointment for the interview!  [facility] [address] [place], [place]   [place]  [place] Department  [place]  [phone]    Behavioral Health Resources in the Community: Intensive Outpatient Programs Organization         Address  Phone  Notes  [facility] [place]. [address] [place], [place] [phone]     [facility] Outpatient [address] [place], [place]   ADS: Alcohol & Drug S[facility] [address] [place], [place]   [place] [address],  [place], [place] or [phone]   Substance Abuse Resources Organization         Address  Phone  Notes  Alcohol and Drug Services  [phone]   [place]  [phone]   [facility] [place]   Chinita Pester  [phone]   Residential & Outpatient Substance Abuse Program  [phone]   Psychological Services Organization         Address  Phone  Notes  [facility] [place]  [place]  [phone]   [place] [address] [place] or [phone]    Mobile Crisis Teams Organization         Address  Phone  Notes  [facility], Mobile Crisis Care Unit  [phone]   [facility]  [address] [place], [place]   [place] Levels [address] [place] [phone]    Self-Help/Support Groups Organization         Address  Phone             Notes  [place]. of [place] - variety of support groups  [place] Call for more information  Narcotics Anonymous (NA), [facility] [address], [employer] [place]  2 meetings at this location   Special educational needs teacher         Address  Phone  Notes  [facility] [place],    [place]  [phone]   [place]  [address] [place] [phone], [place], [place]   [place], [place] [phone] Admissions: 8am-3pm M-F  Incentives Substance [place] [address],    [place], [place] [phone]   [facility] [address] Jadene Pierini [place], [place]   [employer] [facility] [address]  [place],  [place]   [facility] - Intensive Outpatient [phone]  [address], [place], [place]   [facility] ([place] [address].,  [place], [place] [phone] or [phone]   [facility] (RTS) [address] [place], [place] Accepts Medicaid  [facility] [place] [address],  [place] [phone] Substance Abuse/Addiction Treatment   [facility] Organization         Address  Phone  Notes  [facility]  [phone]   Domenic Schwab, PhD [address] Arlis Porta [place], [place]   [phone] or [phone]   [place], [place] [phone]   [place] [address], [place], [place] [phone] Insurance/Medicaid/sponsorship through [facility] and Families [address], [address]                                    [place], [place] [phone] [place] [address] [place], [place] [phone]    Dr. Adele Schilder  [phone]   [facility] Dept. 1) [address] [place] 2) [place] 3)  [place] [address], [place] [phone]  [phone]   [place] [phone] or [phone] (After Hours)

## 2014-09-30 NOTE — ED Notes (Signed)
Admitting MD at BS.  

## 2014-10-01 DIAGNOSIS — Z961 Presence of intraocular lens: Secondary | ICD-10-CM | POA: Diagnosis present

## 2014-10-01 DIAGNOSIS — I4581 Long QT syndrome: Secondary | ICD-10-CM | POA: Diagnosis present

## 2014-10-01 DIAGNOSIS — M1990 Unspecified osteoarthritis, unspecified site: Secondary | ICD-10-CM | POA: Diagnosis present

## 2014-10-01 DIAGNOSIS — M797 Fibromyalgia: Secondary | ICD-10-CM | POA: Diagnosis present

## 2014-10-01 DIAGNOSIS — Z886 Allergy status to analgesic agent status: Secondary | ICD-10-CM | POA: Diagnosis not present

## 2014-10-01 DIAGNOSIS — D631 Anemia in chronic kidney disease: Secondary | ICD-10-CM | POA: Diagnosis present

## 2014-10-01 DIAGNOSIS — Z91041 Radiographic dye allergy status: Secondary | ICD-10-CM | POA: Diagnosis not present

## 2014-10-01 DIAGNOSIS — Z794 Long term (current) use of insulin: Secondary | ICD-10-CM | POA: Diagnosis not present

## 2014-10-01 DIAGNOSIS — I120 Hypertensive chronic kidney disease with stage 5 chronic kidney disease or end stage renal disease: Secondary | ICD-10-CM | POA: Diagnosis present

## 2014-10-01 DIAGNOSIS — Z9841 Cataract extraction status, right eye: Secondary | ICD-10-CM | POA: Diagnosis not present

## 2014-10-01 DIAGNOSIS — E1121 Type 2 diabetes mellitus with diabetic nephropathy: Secondary | ICD-10-CM | POA: Diagnosis present

## 2014-10-01 DIAGNOSIS — I272 Other secondary pulmonary hypertension: Secondary | ICD-10-CM | POA: Diagnosis present

## 2014-10-01 DIAGNOSIS — E11319 Type 2 diabetes mellitus with unspecified diabetic retinopathy without macular edema: Secondary | ICD-10-CM | POA: Diagnosis present

## 2014-10-01 DIAGNOSIS — Z89512 Acquired absence of left leg below knee: Secondary | ICD-10-CM | POA: Diagnosis not present

## 2014-10-01 DIAGNOSIS — I5030 Unspecified diastolic (congestive) heart failure: Secondary | ICD-10-CM | POA: Diagnosis present

## 2014-10-01 DIAGNOSIS — E039 Hypothyroidism, unspecified: Secondary | ICD-10-CM | POA: Diagnosis present

## 2014-10-01 DIAGNOSIS — I739 Peripheral vascular disease, unspecified: Secondary | ICD-10-CM | POA: Diagnosis present

## 2014-10-01 DIAGNOSIS — I959 Hypotension, unspecified: Principal | ICD-10-CM

## 2014-10-01 DIAGNOSIS — Z6841 Body Mass Index (BMI) 40.0 and over, adult: Secondary | ICD-10-CM | POA: Diagnosis not present

## 2014-10-01 DIAGNOSIS — Z992 Dependence on renal dialysis: Secondary | ICD-10-CM | POA: Diagnosis not present

## 2014-10-01 DIAGNOSIS — R0902 Hypoxemia: Secondary | ICD-10-CM | POA: Diagnosis present

## 2014-10-01 DIAGNOSIS — Z940 Kidney transplant status: Secondary | ICD-10-CM | POA: Diagnosis not present

## 2014-10-01 DIAGNOSIS — G4733 Obstructive sleep apnea (adult) (pediatric): Secondary | ICD-10-CM | POA: Diagnosis present

## 2014-10-01 DIAGNOSIS — I2510 Atherosclerotic heart disease of native coronary artery without angina pectoris: Secondary | ICD-10-CM | POA: Diagnosis present

## 2014-10-01 DIAGNOSIS — E780 Pure hypercholesterolemia: Secondary | ICD-10-CM | POA: Diagnosis present

## 2014-10-01 DIAGNOSIS — N186 End stage renal disease: Secondary | ICD-10-CM | POA: Diagnosis present

## 2014-10-01 DIAGNOSIS — I950 Idiopathic hypotension: Secondary | ICD-10-CM | POA: Diagnosis not present

## 2014-10-01 DIAGNOSIS — K219 Gastro-esophageal reflux disease without esophagitis: Secondary | ICD-10-CM | POA: Diagnosis present

## 2014-10-01 DIAGNOSIS — I952 Hypotension due to drugs: Secondary | ICD-10-CM | POA: Diagnosis not present

## 2014-10-01 DIAGNOSIS — R42 Dizziness and giddiness: Secondary | ICD-10-CM | POA: Diagnosis present

## 2014-10-01 DIAGNOSIS — Z91018 Allergy to other foods: Secondary | ICD-10-CM | POA: Diagnosis not present

## 2014-10-01 DIAGNOSIS — Z955 Presence of coronary angioplasty implant and graft: Secondary | ICD-10-CM | POA: Diagnosis not present

## 2014-10-01 DIAGNOSIS — R110 Nausea: Secondary | ICD-10-CM | POA: Diagnosis present

## 2014-10-01 DIAGNOSIS — Z881 Allergy status to other antibiotic agents status: Secondary | ICD-10-CM | POA: Diagnosis not present

## 2014-10-01 DIAGNOSIS — N2581 Secondary hyperparathyroidism of renal origin: Secondary | ICD-10-CM | POA: Diagnosis present

## 2014-10-01 DIAGNOSIS — Z9842 Cataract extraction status, left eye: Secondary | ICD-10-CM | POA: Diagnosis not present

## 2014-10-01 DIAGNOSIS — Z9641 Presence of insulin pump (external) (internal): Secondary | ICD-10-CM | POA: Diagnosis present

## 2014-10-01 DIAGNOSIS — Z888 Allergy status to other drugs, medicaments and biological substances status: Secondary | ICD-10-CM | POA: Diagnosis not present

## 2014-10-01 DIAGNOSIS — Z89421 Acquired absence of other right toe(s): Secondary | ICD-10-CM | POA: Diagnosis not present

## 2014-10-01 LAB — GLUCOSE, CAPILLARY
Glucose-Capillary: 105 mg/dL — ABNORMAL HIGH (ref 65–99)
Glucose-Capillary: 118 mg/dL — ABNORMAL HIGH (ref 65–99)
Glucose-Capillary: 92 mg/dL (ref 65–99)
Glucose-Capillary: 168 mg/dL — ABNORMAL HIGH (ref 65–99)

## 2014-10-01 LAB — CBC
HCT: 29.5 % — ABNORMAL LOW (ref 39.0–52.0)
Hemoglobin: 9.2 g/dL — ABNORMAL LOW (ref 13.0–17.0)
MCH: 33.3 pg (ref 26.0–34.0)
MCHC: 31.2 g/dL (ref 30.0–36.0)
MCV: 106.9 fL — ABNORMAL HIGH (ref 78.0–100.0)
Platelets: 211 10*3/uL (ref 150–400)
RBC: 2.76 MIL/uL — ABNORMAL LOW (ref 4.22–5.81)
RDW: 14.9 % (ref 11.5–15.5)
WBC: 7.6 10*3/uL (ref 4.0–10.5)

## 2014-10-01 LAB — RENAL FUNCTION PANEL
Albumin: 2.7 g/dL — ABNORMAL LOW (ref 3.5–5.0)
Anion gap: 12 (ref 5–15)
BUN: 35 mg/dL — ABNORMAL HIGH (ref 6–20)
CO2: 30 mmol/L (ref 22–32)
Calcium: 9 mg/dL (ref 8.9–10.3)
Chloride: 92 mmol/L — ABNORMAL LOW (ref 101–111)
Creatinine, Ser: 10.1 mg/dL — ABNORMAL HIGH (ref 0.61–1.24)
GFR calc Af Amer: 6 mL/min — ABNORMAL LOW (ref >60)
GFR calc non Af Amer: 5 mL/min — ABNORMAL LOW (ref >60)
Glucose, Bld: 120 mg/dL — ABNORMAL HIGH (ref 65–99)
Phosphorus: 7 mg/dL — ABNORMAL HIGH (ref 2.5–4.6)
Potassium: 4.6 mmol/L (ref 3.5–5.1)
Sodium: 134 mmol/L — ABNORMAL LOW (ref 135–145)

## 2014-10-01 LAB — MAGNESIUM: Magnesium: 2.7 mg/dL — ABNORMAL HIGH (ref 1.7–2.4)

## 2014-10-01 MED ORDER — SODIUM CHLORIDE 0.9 % IV SOLN: 100.0000 mL | INTRAVENOUS | Status: DC | PRN

## 2014-10-01 MED ORDER — HYDROMORPHONE HCL 1 MG/ML IJ SOLN
INTRAMUSCULAR | Status: AC
  Filled 2014-10-01: qty 1

## 2014-10-01 MED ORDER — DARBEPOETIN ALFA 60 MCG/0.3ML IJ SOSY
PREFILLED_SYRINGE | INTRAMUSCULAR | Status: AC
  Administered 2014-10-01: 60 ug via INTRAVENOUS
  Filled 2014-10-01: qty 0.3

## 2014-10-01 MED ORDER — NEPRO/CARBSTEADY PO LIQD
237.0000 mL | ORAL | Status: DC | PRN
  Filled 2014-10-01: qty 237

## 2014-10-01 MED ORDER — PENTAFLUOROPROP-TETRAFLUOROETH EX AERO: 1.0000 "application " | INHALATION_SPRAY | CUTANEOUS | Status: DC | PRN

## 2014-10-01 MED ORDER — LIDOCAINE-PRILOCAINE 2.5-2.5 % EX CREA
1.0000 "application " | TOPICAL_CREAM | CUTANEOUS | Status: DC | PRN
  Filled 2014-10-01: qty 5

## 2014-10-01 MED ORDER — CETYLPYRIDINIUM CHLORIDE 0.05 % MT LIQD
7.0000 mL | Freq: Two times a day (BID) | OROMUCOSAL | Status: DC
  Administered 2014-10-01 – 2014-10-03 (×5): 7 mL via OROMUCOSAL

## 2014-10-01 MED ORDER — SODIUM CHLORIDE 0.9 % IV SOLN
62.5000 mg | INTRAVENOUS | Status: DC
  Administered 2014-10-01: 62.5 mg via INTRAVENOUS
  Filled 2014-10-01: qty 5

## 2014-10-01 MED ORDER — HEPARIN SODIUM (PORCINE) 1000 UNIT/ML DIALYSIS: 20.0000 [IU]/kg | INTRAMUSCULAR | Status: DC | PRN

## 2014-10-01 MED ORDER — DARBEPOETIN ALFA 60 MCG/0.3ML IJ SOSY
60.0000 ug | PREFILLED_SYRINGE | INTRAMUSCULAR | Status: DC
  Administered 2014-10-01: 60 ug via INTRAVENOUS

## 2014-10-01 MED ORDER — LIDOCAINE HCL (PF) 1 % IJ SOLN: 5.0000 mL | INTRAMUSCULAR | Status: DC | PRN

## 2014-10-01 MED ORDER — DOXERCALCIFEROL 4 MCG/2ML IV SOLN
4.0000 ug | INTRAVENOUS | Status: DC
  Administered 2014-10-01 – 2014-10-03 (×2): 4 ug via INTRAVENOUS
  Filled 2014-10-01: qty 2

## 2014-10-01 MED ORDER — ALTEPLASE 2 MG IJ SOLR
2.0000 mg | Freq: Once | INTRAMUSCULAR | Status: AC | PRN
  Filled 2014-10-01: qty 2

## 2014-10-01 MED ORDER — DIPHENHYDRAMINE HCL 50 MG/ML IJ SOLN
50.0000 mg | Freq: Once | INTRAMUSCULAR | Status: AC
  Administered 2014-10-01: 50 mg via INTRAVENOUS
  Filled 2014-10-01: qty 1

## 2014-10-01 MED ORDER — DOXERCALCIFEROL 4 MCG/2ML IV SOLN
INTRAVENOUS | Status: AC
  Administered 2014-10-01: 4 ug via INTRAVENOUS
  Filled 2014-10-01: qty 2

## 2014-10-01 MED ORDER — HEPARIN SODIUM (PORCINE) 1000 UNIT/ML DIALYSIS: 1000.0000 [IU] | INTRAMUSCULAR | Status: DC | PRN

## 2014-10-01 NOTE — Consult Note (Signed)
[facility] Renal Consultation Note    Indication for Consultation:  Management of ESRD/hemodialysis; anemia, hypertension/volume and secondary hyperparathyroidism PCP:  HPI: Dale [name] is a 49 y.o. male with ESRD on MWF dialysis at [place] dialyzed Monday with a net UF of 3.5 - pre BP of 120 - 130 and post BP of 115/50 sitting down to 94/49 standing (more orthostatic than usual).  Let 1.5 kg above his EDW Ran his full time. He has a hx of CAD s/p stent, hx of HTN and orthostasis.  He became dizzy after he went home and BP at home was [id number] systolic so he came to the ED where he was given 250 cc saline bolus.  He was admitted for OBS and is still in OBS status. HE denies any Chest pain, SOB but had some nause when eating. [place] was negative.  Lab work is unremarkable. Hgb was 9.6 down to 9.2 consistent with outpatient Hgb. O2 sat dropping here.  Past Medical History  Diagnosis Date  . Hypertension   . History of blood transfusion     "related to dialysis"  . Dyspnea     a. 07/23/11 Echo: EF 55-60%  . Anemia of chronic disease   . Cholecystitis     a. 08/27/2011  . Constipation   . Pericardial effusion     a.  Small by CT 08/22/11;  b.  Large by CT 08/27/11  . Inguinal lymphadenopathy     a. bilateral - s/p biopsy 07/2011  . Fibromyalgia   . Hx of echocardiogram     Echo (11/15):  Mild LVH, EF 60-65%, no RWMA, Gr 1 DD, MAC, mild LAE, normal RVF, mild RAE  . Hx of cardiovascular stress test     Lexiscan Myoview (11/15):  Inf, apical cap and apical lateral ischemia, EF 56%, inf HK, Moderate Risk  . High cholesterol   . Pneumonia ~ 2007  . OSA (obstructive sleep apnea)     "I'm not doing the mask; claustrophobic"  . Claustrophobia   . Hypothyroidism   . Insulin dependent diabetes mellitus   . GERD (gastroesophageal reflux disease)   . Arthritis     "hands, right knee" (03/19/2014)  . ESRD (end stage renal disease)     a. 1995 s/p cadaveric transplant w/ susbequent failure  after 18 yrs;  b.Dialysis initiated 07/2011  . CAD (coronary artery disease)     a. LHC (11/15):  mid to dist Dx 80%, mid RCA 99% (functional CTO), dist RCA 80% >> PCI: balloon angioplasty to mid RCA (could not deliver stent) - plan staged PCI with HSRA of RCA; Dx to be tx medically    Past Surgical History  Procedure Laterality Date  . Kidney transplant  04/25/1993  . Cataract extraction w/ intraocular lens  implant, bilateral Bilateral 1990's  . Arteriovenous graft placement Left 1993?    forearm  . Arteriovenous graft placement Right 1993?    leg  . Toe amputation Bilateral after 2005  . Below knee leg amputation Left 2010  . Carpal tunnel release Bilateral after 2005  . Shoulder arthroscopy Left ~ 2006    frozen  . Av fistula placement  07/29/2011    Procedure: ARTERIOVENOUS (AV) FISTULA CREATION;  Surgeon: Mal Misty, MD;  Location: [place];  Service: Vascular;  Laterality: Right;  Brachial cephalic  . Insertion of dialysis catheter  08/01/2011    Procedure: INSERTION OF DIALYSIS CATHETER;  Surgeon: Rosetta Posner, MD;  Location: [place];  Service: Vascular;  Laterality: Right;  insertion of dialysis catheter on right internal jugular vein  . Lymph node biopsy  08/10/2011    Procedure: LYMPH NODE BIOPSY;  Surgeon: Merrie Roof, MD;  Location: [place];  Service: General;  Laterality: Left;  left groin lymph biopsy  . Colonoscopy  08/29/2011    Procedure: COLONOSCOPY;  Surgeon: Jerene Bears, MD;  Location: [place];  Service: Gastroenterology;  Laterality: N/A;  . Appendectomy  ~ 2006  . Hernia repair    . Umbilical hernia repair  1990's  . Cardiac catheterization  03/19/2014  . Av fistula placement Left 1993?    "attempted one in my wrist; didn't take"  . Neuroplasty / transposition ulnar nerve at elbow Left after 2005  . Venogram Right 07/28/2011    Procedure: VENOGRAM;  Surgeon: Conrad De Graff, MD;  Location: [facility] CATH LAB;  Service: Cardiovascular;  Laterality: Right;  . Left heart  catheterization with coronary angiogram N/A 03/19/2014    Procedure: LEFT HEART CATHETERIZATION WITH CORONARY ANGIOGRAM;  Surgeon: Burnell Blanks, MD;  Location: [facility] CATH LAB;  Service: Cardiovascular;  Laterality: N/A;  . Cardiac catheterization  03/19/2014    Procedure: CORONARY BALLOON ANGIOPLASTY;  Surgeon: Burnell Blanks, MD;  Location: [facility] CATH LAB;  Service: Cardiovascular;;  . Coronary angioplasty with stent placement  04/09/2014        ptca/des mid lad   . Angioplasty  04/09/2014    RCA   . Femoral artery repair  04/09/2014    ANGIOSEAL  . Eye surgery    . Percutaneous coronary rotoblator intervention (pci-r) N/A 04/09/2014    Procedure: PERCUTANEOUS CORONARY ROTOBLATOR INTERVENTION (PCI-R);  Surgeon: Burnell Blanks, MD;  Location: [facility] C[facility] LAB;  Service: Cardiovascular;  Laterality: N/A;   Family History  Problem Relation Age of Onset  . Malignant hyperthermia Neg Hx   . Colon polyps Father   . Diabetes Father   . Cancer Father   . Breast cancer Maternal Aunt   . Cancer Maternal Aunt     Breast and Bone  . Heart attack Neg Hx   . Stroke Neg Hx   . Hypertension Mother   . Hypertension Father   . Hypertension Father    Social History:  reports that he has never smoked. He has never used smokeless tobacco. He reports that he drinks alcohol. He reports that he does not use illicit drugs. Allergies  Allergen Reactions  . Doxycycline     RASH  . Hydrocodone     RASH  . Morphine And Related Hives and Nausea Only  . Amoxicillin Other (See Comments)    Reaction unknown  . Ciprofloxacin Other (See Comments)    Reaction unknown  . Clindamycin/Lincomycin Other (See Comments)    Reaction unknown  . Codeine Other (See Comments)    Reaction unknown. But pt has tolerated hydrocodone in Vicodin and Norco in the past  . Levofloxacin Other (See Comments)    Reaction unknown  . Rocephin [Ceftriaxone] Itching  . Vasotec Cough  . Contrast Media [Iodinated  Diagnostic Agents] Rash  . Oxycodone Rash  . Plavix [Clopidogrel Bisulfate] Rash   Prior to Admission medications   Medication Sig Start Date End Date Taking? Authorizing Provider  allopurinol (ZYLOPRIM) 100 MG tablet Take 200 mg by mouth daily.  03/14/12  Yes Historical Provider, MD  aspirin 81 MG tablet Take 81 mg by mouth every evening.    Yes Historical Provider, MD  atorvastatin (LIPITOR) 10  MG tablet Take 10 mg by mouth every evening.    Yes Historical Provider, MD  calcium acetate (PHOSLO) 667 MG capsule Take 667-1,334 mg by mouth See admin instructions. Take 1,334 mg (2 capsules) with meals and 667 (1 capsule) with snacks 09/19/12  Yes Historical Provider, MD  cephALEXin (KEFLEX) 500 MG capsule Take 1 capsule (500 mg total) by mouth 3 (three) times daily. 09/24/14  Yes Antonietta Breach, PA-C  diphenhydrAMINE (BENADRYL) 25 mg capsule Take 1-2 capsules (25-50 mg total) by mouth every 6 (six) hours as needed for itching. 04/11/14  Yes Rhonda G Barrett, PA-C  docusate sodium (COLACE) 100 MG capsule Take 200 mg by mouth 3 (three) times daily as needed for mild constipation.    Yes Historical Provider, MD  furosemide (LASIX) 80 MG tablet Take 2 tablets (160 mg total) by mouth 2 (two) times daily. OK to take if you are still urinating and it is helpful. 04/11/14  Yes Rhonda G Barrett, PA-C  GLUCAGON EMERGENCY 1 MG injection Inject 1 mg into the muscle daily as needed (low blood sugar).  01/19/12  Yes Historical Provider, MD  HYDROmorphone (DILAUDID) 2 MG tablet Take 2 mg by mouth 3 (three) times daily as needed for severe pain.  09/03/12  Yes Historical Provider, MD  Insulin Human (INSULIN PUMP) 100 unit/ml SOLN Inject into the skin continuous. Humalog   Yes Historical Provider, MD  isosorbide mononitrate (IMDUR) 30 MG 24 hr tablet Take 1 tablet (30 mg total) by mouth daily. 03/06/14  Yes Scott Joylene Draft, PA-C  levothyroxine (SYNTHROID, LEVOTHROID) 100 MCG tablet Take 100 mcg by mouth daily before breakfast.    Yes Historical Provider, MD  loratadine (CLARITIN) 10 MG tablet Take 10 mg by mouth daily.   Yes Historical Provider, MD  metoCLOPramide (REGLAN) 5 MG tablet Take 5 mg by mouth 4 (four) times daily - after meals and at bedtime. 05/26/11  Yes Historical Provider, MD  metoprolol succinate (TOPROL-XL) 25 MG 24 hr tablet Take 0.5 tablets (12.5 mg total) by mouth daily. 03/20/14  Yes Liliane Shi, PA-C  multivitamin (RENA-VIT) TABS tablet Take 1 tablet by mouth daily.   Yes Historical Provider, MD  nitroGLYCERIN (NITROSTAT) 0.4 MG SL tablet Place 0.4 mg under the tongue every 5 (five) minutes as needed for chest pain.    Yes [name], MD  promethazine (PHENERGAN) 25 MG tablet Take 25 mg by mouth Every 6 hours as needed for nausea or vomiting. For nausea 07/13/11  Yes Historical Provider, MD  rOPINIRole (REQUIP) 2 MG tablet Take 2 mg by mouth daily with supper.    Yes Historical Provider, MD  sevelamer carbonate (RENVELA) 800 MG tablet Take 1,600 mg by mouth 3 (three) times daily with meals.   Yes Historical Provider, MD  sulfamethoxazole-trimethoprim (BACTRIM DS,SEPTRA DS) 800-160 MG per tablet Take 1 tablet by mouth at bedtime. 09/24/14 10/01/14 Yes Kelly Humes, PA-C  ondansetron (ZOFRAN) 4 MG tablet Take 1 tablet (4 mg total) by mouth every 8 (eight) hours as needed for nausea or vomiting. 09/30/14   Dahlia Bailiff, PA-C  prasugrel (EFFIENT) 10 MG TABS tablet Take 1 tablet (10 mg total) by mouth daily. 04/03/14   Liliane Shi, PA-C   Current Facility-Administered Medications  Medication Dose Route Frequency Provider Last Rate Last Dose  . acetaminophen (TYLENOL) tablet 650 mg  650 mg Oral Q6H PRN Rise Patience, MD       Or  . acetaminophen (TYLENOL) suppository 650 mg  650  mg Rectal Q6H PRN Rise Patience, MD      . allopurinol (ZYLOPRIM) tablet 200 mg  200 mg Oral Daily Rise Patience, MD   200 mg at 10/01/14 1023  . antiseptic oral rinse (CPC / CETYLPYRIDINIUM CHLORIDE 0.05%)  solution 7 mL  7 mL Mouth Rinse BID Charlynne Cousins, MD   7 mL at 10/01/14 1115  . aspirin EC tablet 81 mg  81 mg Oral QPM Theodis Blaze, MD      . atorvastatin (LIPITOR) tablet 10 mg  10 mg Oral QPM Rise Patience, MD   10 mg at 09/30/14 1803  . calcium acetate (PHOSLO) capsule 1,334 mg  1,334 mg Oral TID WC Rise Patience, MD   1,334 mg at 10/01/14 1332  . calcium acetate (PHOSLO) capsule 667 mg  667 mg Oral BID PRN Rise Patience, MD      . Derrill Memo ON 10/08/2014] Darbepoetin Alfa (ARANESP) injection 60 mcg  60 mcg Intravenous Q Wed-HD Alric Seton, PA-C      . diphenhydrAMINE (BENADRYL) capsule 25-50 mg  25-50 mg Oral Q6H PRN Rise Patience, MD      . docusate sodium (COLACE) capsule 200 mg  200 mg Oral TID PRN Rise Patience, MD      . Derrill Memo ON 10/03/2014] doxercalciferol (HECTOROL) injection 4 mcg  4 mcg Intravenous Q M,W,F-HD Alric Seton, PA-C      . enoxaparin (LOVENOX) injection 30 mg  30 mg Subcutaneous Q24H Rise Patience, MD   30 mg at 10/01/14 1242  . [START ON 10/08/2014] ferric gluconate (NULECIT) 62.5 mg in sodium chloride 0.9 % 100 mL IVPB  62.5 mg Intravenous Q Wed-HD Alric Seton, PA-C      . HYDROmorphone (DILAUDID) injection 1 mg  1 mg Intravenous Q3H PRN Theodis Blaze, MD   1 mg at 10/01/14 1651  . insulin pump   Subcutaneous TID AC, HS, 0200 Rise Patience, MD      . levothyroxine (SYNTHROID, LEVOTHROID) tablet 100 mcg  100 mcg Oral QAC breakfast Rise Patience, MD      . loratadine (CLARITIN) tablet 10 mg  10 mg Oral Daily Rise Patience, MD   10 mg at 10/01/14 1023  . metoCLOPramide (REGLAN) tablet 5 mg  5 mg Oral TID PC & HS Rise Patience, MD   5 mg at 10/01/14 1243  . multivitamin (RENA-VIT) tablet 1 tablet  1 tablet Oral Daily Rise Patience, MD   1 tablet at 10/01/14 1023  . ondansetron (ZOFRAN) tablet 4 mg  4 mg Oral Q6H PRN Rise Patience, MD   4 mg at 09/30/14 1802   Or  . ondansetron (ZOFRAN)  injection 4 mg  4 mg Intravenous Q6H PRN Rise Patience, MD   4 mg at 09/30/14 0905  . prasugrel (EFFIENT) tablet 10 mg  10 mg Oral Daily Rise Patience, MD   10 mg at 10/01/14 1023  . promethazine (PHENERGAN) tablet 25 mg  25 mg Oral Q6H PRN Rise Patience, MD   25 mg at 09/30/14 1105  . rOPINIRole (REQUIP) tablet 2 mg  2 mg Oral Q supper Theodis Blaze, MD   2 mg at 09/30/14 1804  . sevelamer carbonate (RENVELA) tablet 1,600 mg  1,600 mg Oral TID WC Rise Patience, MD   1,600 mg at 10/01/14 1332  . sodium chloride 0.9 % injection 3 mL  3 mL Intravenous Q12H Arshad  Aida Puffer, MD   3 mL at 10/01/14 1024   Labs: Basic Metabolic Panel:  Recent Labs Lab 09/29/14 2230 09/30/14 1150 10/01/14 0524  NA 134* 137 134*  K 3.8 3.9 4.6  CL 96* 96* 92*  CO2 27 32 30  GLUCOSE 235* 156* 120*  BUN 26* 27* 35*  CREATININE 6.73* 8.43* 10.10*  CALCIUM 8.1* 8.9 9.0  PHOS  --   --  7.0*   Liver Function Tests:  Recent Labs Lab 09/29/14 2230 09/30/14 1150 10/01/14 0524  AST 19 18  --   ALT 16* 16*  --   ALKPHOS 92 94  --   BILITOT 0.5 0.2*  --   PROT 6.6 7.3  --   ALBUMIN 2.8* 2.8* 2.7*   No results for input(s): LIPASE, AMYLASE in the last 168 hours. No results for input(s): AMMONIA in the last 168 hours. CBC:  Recent Labs Lab 09/29/14 2230 09/30/14 1150 10/01/14 0524  WBC 8.8 7.1 7.6  NEUTROABS 6.1 4.7  --   HGB 9.5* 9.6* 9.2*  HCT 30.5* 30.4* 29.5*  MCV 106.6* 105.6* 106.9*  PLT 151 217 211   Cardiac Enzymes:  Recent Labs Lab 09/30/14 0002 09/30/14 1150  TROPONINI <0.03 <0.03   CBG:  Recent Labs Lab 09/30/14 2054 10/01/14 0159 10/01/14 0753 10/01/14 1135 10/01/14 1630  GLUCAP 151* 105* 118* 92 168*  Studies/Results: Dg Chest 2 View  09/30/2014   CLINICAL DATA:  Hypotension.  Weakness.  EXAM: CHEST  2 VIEW  COMPARISON:  09/17/2014  FINDINGS: Lower lung volumes from prior exam which in combination with soft tissue attenuation from body  habitus limits assessment. Increased right infrahilar markings. Cardiomegaly is grossly stable allowing for differences in technique. Question of increased bronchial thickening versus lower lung volumes. No large pleural effusion or pneumothorax.  IMPRESSION: Lower lung volumes with increased right infrahilar markings, likely atelectasis, and question of bronchial thickening.   Electronically Signed   By: Jeb Levering M.D.   On: 09/30/2014 01:43   US Abdomen Complete  09/30/2014   CLINICAL DATA:  Nausea, right-sided abdominal pain to palpation, hypotension; history of end-stage renal disease and remote history of renal transplant with subsequent transplant failure, diabetes, coronary artery disease, morbid obesity.  EXAM: ULTRASOUND ABDOMEN COMPLETE  COMPARISON:  Abdominal ultrasound of December 24, 2013  FINDINGS: Gallbladder: The gallbladder is distended. No stones are evident. There is no pericholecystic fluid or gallbladder wall thickening. There is no positive sonographic Murphy's sign.  Common bile duct: Diameter: 3.8 mm  Liver: The liver exhibits normal echotexture with no focal mass or ductal dilation.  IVC: Bowel gas limited evaluation of the inferior vena cava.  Pancreas: Bowel gas obscured the pancreatic head and tail.  Spleen: Size and appearance within normal limits.  Right Kidney: Length: 12.6 cm. There increased renal cortical echotexture diffusely. There is no hydronephrosis.  Left Kidney: Length: 11.2 cm. Bowel gas limited evaluation of the left renal parenchyma.  Abdominal aorta: Bowel gas partially obscured the abdominal aorta  Other findings: In the left aspect of the pelvis the failed renal transplanted kidney is demonstrated. It measures 12 cm in length.  IMPRESSION: 1. Mild gallbladder distention without sonographic evidence of stones or acute cholecystitis. If gallbladder dysfunction is suspected clinically, nuclear medicine hepatobiliary scan may be useful. 2. The liver and common bile  duct exhibit no acute abnormalities. Evaluation of the pancreas is limited by bowel gas. 3. Increased echogenicity of both kidneys consistent with known medical renal disease. The  transplanted kidney also demonstrates increased echogenicity consistent with its new with known transplant failure. 4. If the patient's clinical status remains unclear, abdominal pelvic CT scanning may be the most useful next imaging steps.   Electronically Signed   By: David  Martinique M.D.   On: 09/30/2014 07:46    ROS: As per HPI otherwise negative.  Physical Exam: Filed Vitals:   09/30/14 2055 10/01/14 0500 10/01/14 0814 10/01/14 1500  BP: 115/64 103/41 133/66 125/56  Pulse: 81 81 84 84  Temp: 98.4 F (36.9 C) 97.9 F (36.6 C) 98 F (36.7 C) 97.9 F (36.6 C)  TempSrc: Oral Oral Oral Oral  Resp: 18 18 17 17   Height:      Weight: 135.353 kg (298 lb 6.4 oz)     SpO2: 100% 100% 99% 95%     General: Well developed, well nourished, in no acute distress. Head: Normocephalic, atraumatic, sclera non-icteric, mucus membranes are moist Fundi with much scarring, laser Neck: Supple. JVD not elevated.PCL Lungs: Clear bilaterally to auscultation without wheezes, rales, or rhonchi. Breathing is unlabored.decreased bs Heart: RRR with S1 S2. Gr 2/6 M Abdomen: Soft, non-tender, non-distended with normoactive bowel sounds. No rebound/guarding. No obvious abdominal masses. Striae,massive obesity M-S:  Strength and tone appear normal for age. Lower extremities: L BKA, 1-2+ presacral edema Neuro: Alert and oriented X 3. Moves all extremities spontaneously. Psych:  Responds to questions appropriately with a normal affect. Dialysis Access: right AVF  Dialysis Orders: AF MWF 180 4 hr 400/800 2k 2.25 Ca right lower AVF with 100% occlusion of right inomminate - waiting on appt at [facility] Assessment/Plan: 1.  Dizziness - chronic issue - check orthostatics pre and post HD; re-evaluate BP. ?other factors,   2.  ESRD -  MWF - HD today;  has 100% right innominate occlusion to be evaluated at [facility]-awaiting appt - AVF still functioning but with lower AFs and borderline kinetics.  WGTS??? Will take off 3.5 liters. Has vol xs but limit removal with low bps 3.  Hypotension/volume  - not getting to EDW; admission CXR low lung volumes, - weights here HIGHLY variable - get standing weights and BP pre and post HD are critical ? pulm HTN, R/O PE 4.  Anemia  - Hgb 9.2 - stable - consistant with outpt - redose ESA today and continue weekly Fe  5.  Metabolic bone disease -  Continue hectorol - P never < 6; on  phoslo and renvela 6.  Nutrition - renal carb mod diet + vitmain 7. Massive obesity 8. PVD 9. DM retinopathy couseled.    Myriam Jacobson, PA-C [facility] Beeper [phone] 10/01/2014, 4:57 PM I have seen and examined this patient and agree with the plan of care seen, eval,examined, changes made. Patient counseled, discussed with Dr. Venetia Constable .  Minie Roadcap L 10/01/2014, 5:40 PM

## 2014-10-01 NOTE — Progress Notes (Addendum)
[facility] PROGRESS NOTE Interim History: 49 y.o. male with history of ESRD on hemodialysis, CAD status post stenting, chronic anemia, hypertension presents to the ER because of low blood pressure   Assessment/Plan: Dizziness/hypotension: - Antihypertensive medications were held on admission he was given a 250 mL bolus of normal saline. - In his blood pressure has improved. Mildly hypoxic check ct angio rule out PE. - I will DC her Lasix  And Imdur  ESRD (end stage renal disease) on dialysis: - He dialyzes Monday Wednesday and Friday. Nephrology to see.  Nausea: It is most likely due to hypotension. Abdominal ultrasound showed no acute pathologies.  Prolonged QT: Check a magnesium level no evidence on telemetry.   Diabetes mellitus with nephropathy. Blood glucose has been well controlled on insulin pump.   Code Status: full Family Communication: none  Disposition Plan: home in am   Consultants:  nephrology  Procedures:  Abdominal ultrasound  Chest x-ray  Antibiotics:  none  HPI/Subjective: No complains  Objective: Filed Vitals:   09/30/14 1400 09/30/14 2055 10/01/14 0500 10/01/14 0814  BP: 124/62 115/64 103/41 133/66  Pulse: 82 81 81 84  Temp: 98.1 F (36.7 C) 98.4 F (36.9 C) 97.9 F (36.6 C) 98 F (36.7 C)  TempSrc: Oral Oral Oral Oral  Resp: 20 18 18 17   Height:      Weight:  135.353 kg (298 lb 6.4 oz)    SpO2: 100% 100% 100% 99%    Intake/Output Summary (Last 24 hours) at 10/01/14 1422 Last data filed at 10/01/14 1359  Gross per 24 hour  Intake    470 ml  Output      0 ml  Net    470 ml   Filed Weights   09/29/14 2010 09/30/14 2055  Weight: 127.001 kg (279 lb 15.8 oz) 135.353 kg (298 lb 6.4 oz)    Exam:  General: Alert, awake, oriented x1, in no acute distress.  HEENT: No bruits, no goiter.  Heart: Regular rate and rhythm.  Lungs: Good air movement, clear Abdomen: Soft, nontender, nondistended, positive bowel sounds.    Neuro: Grossly intact, nonfocal.   Data Reviewed: Basic Metabolic Panel:  Recent Labs Lab 09/29/14 2230 09/30/14 1150 10/01/14 0524  NA 134* 137 134*  K 3.8 3.9 4.6  CL 96* 96* 92*  CO2 27 32 30  GLUCOSE 235* 156* 120*  BUN 26* 27* 35*  CREATININE 6.73* 8.43* 10.10*  CALCIUM 8.1* 8.9 9.0  PHOS  --   --  7.0*   Liver Function Tests:  Recent Labs Lab 09/29/14 2230 09/30/14 1150 10/01/14 0524  AST 19 18  --   ALT 16* 16*  --   ALKPHOS 92 94  --   BILITOT 0.5 0.2*  --   PROT 6.6 7.3  --   ALBUMIN 2.8* 2.8* 2.7*   No results for input(s): LIPASE, AMYLASE in the last 168 hours. No results for input(s): AMMONIA in the last 168 hours. CBC:  Recent Labs Lab 09/29/14 2230 09/30/14 1150 10/01/14 0524  WBC 8.8 7.1 7.6  NEUTROABS 6.1 4.7  --   HGB 9.5* 9.6* 9.2*  HCT 30.5* 30.4* 29.5*  MCV 106.6* 105.6* 106.9*  PLT 151 217 211   Cardiac Enzymes:  Recent Labs Lab 09/30/14 0002 09/30/14 1150  TROPONINI <0.03 <0.03   BNP (last 3 results) No results for input(s): BNP in the last 8760 hours.  ProBNP (last 3 results) No results for input(s): PROBNP in the last 8760 hours.  CBG:  Recent Labs Lab 09/30/14 1735 09/30/14 2054 10/01/14 0159 10/01/14 0753 10/01/14 1135  GLUCAP 137* 151* 105* 118* 92    Recent Results (from the past 240 hour(s))  MRSA PCR Screening     Status: None   Collection Time: 09/30/14  2:36 PM  Result Value Ref Range Status   MRSA by PCR NEGATIVE NEGATIVE Final    Comment:        The GeneXpert MRSA Assay (FDA approved for NASAL specimens only), is one component of a comprehensive MRSA colonization surveillance program. It is not intended to diagnose MRSA infection nor to guide or monitor treatment for MRSA infections.      Studies: Dg Chest 2 View  09/30/2014   CLINICAL DATA:  Hypotension.  Weakness.  EXAM: CHEST  2 VIEW  COMPARISON:  09/17/2014  FINDINGS: Lower lung volumes from prior exam which in combination with soft  tissue attenuation from body habitus limits assessment. Increased right infrahilar markings. Cardiomegaly is grossly stable allowing for differences in technique. Question of increased bronchial thickening versus lower lung volumes. No large pleural effusion or pneumothorax.  IMPRESSION: Lower lung volumes with increased right infrahilar markings, likely atelectasis, and question of bronchial thickening.   Electronically Signed   By: Jeb Levering M.D.   On: 09/30/2014 01:43   US Abdomen Complete  09/30/2014   CLINICAL DATA:  Nausea, right-sided abdominal pain to palpation, hypotension; history of end-stage renal disease and remote history of renal transplant with subsequent transplant failure, diabetes, coronary artery disease, morbid obesity.  EXAM: ULTRASOUND ABDOMEN COMPLETE  COMPARISON:  Abdominal ultrasound of December 24, 2013  FINDINGS: Gallbladder: The gallbladder is distended. No stones are evident. There is no pericholecystic fluid or gallbladder wall thickening. There is no positive sonographic Murphy's sign.  Common bile duct: Diameter: 3.8 mm  Liver: The liver exhibits normal echotexture with no focal mass or ductal dilation.  IVC: Bowel gas limited evaluation of the inferior vena cava.  Pancreas: Bowel gas obscured the pancreatic head and tail.  Spleen: Size and appearance within normal limits.  Right Kidney: Length: 12.6 cm. There increased renal cortical echotexture diffusely. There is no hydronephrosis.  Left Kidney: Length: 11.2 cm. Bowel gas limited evaluation of the left renal parenchyma.  Abdominal aorta: Bowel gas partially obscured the abdominal aorta  Other findings: In the left aspect of the pelvis the failed renal transplanted kidney is demonstrated. It measures 12 cm in length.  IMPRESSION: 1. Mild gallbladder distention without sonographic evidence of stones or acute cholecystitis. If gallbladder dysfunction is suspected clinically, nuclear medicine hepatobiliary scan may be useful.  2. The liver and common bile duct exhibit no acute abnormalities. Evaluation of the pancreas is limited by bowel gas. 3. Increased echogenicity of both kidneys consistent with known medical renal disease. The transplanted kidney also demonstrates increased echogenicity consistent with its new with known transplant failure. 4. If the patient's clinical status remains unclear, abdominal pelvic CT scanning may be the most useful next imaging steps.   Electronically Signed   By: David  Martinique M.D.   On: 09/30/2014 07:46    Scheduled Meds: . allopurinol  200 mg Oral Daily  . antiseptic oral rinse  7 mL Mouth Rinse BID  . aspirin EC  81 mg Oral QPM  . atorvastatin  10 mg Oral QPM  . calcium acetate  1,334 mg Oral TID WC  . enoxaparin (LOVENOX) injection  30 mg Subcutaneous Q24H  . insulin aspart  0-9 Units Subcutaneous TID WC  .  insulin pump   Subcutaneous TID AC, HS, 0200  . levothyroxine  100 mcg Oral QAC breakfast  . loratadine  10 mg Oral Daily  . metoCLOPramide  5 mg Oral TID PC & HS  . multivitamin  1 tablet Oral Daily  . prasugrel  10 mg Oral Daily  . rOPINIRole  2 mg Oral Q supper  . sevelamer carbonate  1,600 mg Oral TID WC  . sodium chloride  3 mL Intravenous Q12H   Continuous Infusions:   Time Spent: 25 min   Charlynne Cousins  [facility] Pager [phone]. If 7PM-7AM, please contact night-coverage at [email or web address], password [facility] 10/01/2014, 2:22 PM

## 2014-10-02 ENCOUNTER — Inpatient Hospital Stay (HOSPITAL_COMMUNITY): Payer: Medicare Other

## 2014-10-02 ENCOUNTER — Encounter (HOSPITAL_COMMUNITY): Payer: Self-pay | Admitting: *Deleted

## 2014-10-02 DIAGNOSIS — I950 Idiopathic hypotension: Secondary | ICD-10-CM

## 2014-10-02 LAB — GLUCOSE, CAPILLARY
Glucose-Capillary: 177 mg/dL — ABNORMAL HIGH (ref 65–99)
Glucose-Capillary: 189 mg/dL — ABNORMAL HIGH (ref 65–99)
Glucose-Capillary: 190 mg/dL — ABNORMAL HIGH (ref 65–99)
Glucose-Capillary: 190 mg/dL — ABNORMAL HIGH (ref 65–99)
Glucose-Capillary: 273 mg/dL — ABNORMAL HIGH (ref 65–99)

## 2014-10-02 MED ORDER — TRAMADOL HCL 50 MG PO TABS: 50.0000 mg | ORAL_TABLET | Freq: Four times a day (QID) | ORAL | Status: DC | PRN

## 2014-10-02 MED ORDER — HYDROMORPHONE HCL 2 MG PO TABS
2.0000 mg | ORAL_TABLET | Freq: Three times a day (TID) | ORAL | Status: DC | PRN
  Administered 2014-10-02: 2 mg via ORAL
  Filled 2014-10-02: qty 1

## 2014-10-02 MED ORDER — IOHEXOL 350 MG/ML SOLN
80.0000 mL | Freq: Once | INTRAVENOUS | Status: AC | PRN
  Administered 2014-10-02: 80 mL via INTRAVENOUS

## 2014-10-02 MED ORDER — HYDROXYZINE HCL 25 MG PO TABS
25.0000 mg | ORAL_TABLET | ORAL | Status: AC
  Administered 2014-10-02: 25 mg via ORAL
  Filled 2014-10-02: qty 1

## 2014-10-02 MED ORDER — DIPHENHYDRAMINE HCL 50 MG/ML IJ SOLN
50.0000 mg | Freq: Once | INTRAMUSCULAR | Status: AC
  Administered 2014-10-02: 50 mg via INTRAVENOUS
  Filled 2014-10-02: qty 1

## 2014-10-02 NOTE — Progress Notes (Signed)
SATURATION QUALIFICATIONS:   Patient Saturations on Room Air at Rest =  98 % Patient Saturations on Room Air while Ambulating = 95% ( Pt did become short of breath while ambulating; however O2 sats did not drop) Patient Saturations on 2 Liters of oxygen while Ambulating = 100%  Please briefly explain why patient needs home oxygen:

## 2014-10-02 NOTE — Progress Notes (Signed)
Inpatient Diabetes Program Recommendations  AACE/ADA: New Consensus Statement on Inpatient Glycemic Control (2013)  Target Ranges:  Prepandial:   less than 140 mg/dL      Peak postprandial:   less than 180 mg/dL (1-2 hours)      Critically ill patients:  140 - 180 mg/dL   Glucose overall well controlled. Requested Nursing to document the insulin pump flowsheet checklist to assure pt has followed procedure regarding changing the infusion set and inspection of site.  Thank you Rosita Kea, RN, MSN, CDE  Diabetes Inpatient Program Office: [phone] Pager: [phone] 8:00 am to 5:00 pm

## 2014-10-02 NOTE — Care Management Note (Signed)
Case Management Note  Patient Details  Name: Dale [name] MRN: [id number] Date of Birth: 08-31-1965  Subjective/Objective:             CM following for progression and d/c planning.       Action/Plan:  No d/c needs identified.  Expected Discharge Date:        10/02/2014          Expected Discharge Plan:  Home/Self Care  In-House Referral:  NA  Discharge planning Services  NA  Post Acute Care Choice:  NA Choice offered to:  NA  DME Arranged:    DME Agency:     HH Arranged:    HH Agency:     Status of Service:  Completed, signed off  Medicare Important Message Given:  Yes Date Medicare IM Given:  10/02/14 Medicare IM give by:  [name] RN MPH, case manager, [phone] Date Additional Medicare IM Given:    Additional Medicare Important Message give by:     If discussed at [employer] of Stay Meetings, dates discussed:    Additional Comments:  Adron Bene, RN 10/02/2014, 11:45 AM

## 2014-10-02 NOTE — Progress Notes (Signed)
[facility] Progress Note  Assessment/Plan: 1. Dizziness- orthostasis with standing post HD and BP drops with HD even with low goal; Echo 08/2014 Severe LVH EF 55- 60%l am cortisol low at 5.4, CT angio ordered for today, has contrast allergy 2. ESRD - MWF - HD Friday. UF as BP allows 3. Anemia - Hgb 9.2 On ESA 4. Secondary hyperparathyroidism - Hectorol/binders 5. Hypotension/volume - net UF 1156 Wednesday with post weight of 134.6 - significantly above outpt edw; imdur, lasix on hold; I have asked his RN to get a standing weight today and subtract prosthesis weight and let me know where we are volume wise.  6. Nutrition - rneal carb mod/vit 7. Morbid obesity 8. PVD - left BKA, well healed 9. DM -  10.  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  Concern of OSA vs pulm HTN  Myriam Jacobson, PA-C [facility] Beeper [phone] 10/02/2014,9:11 AM  LOS: 1 day  I have seen and examined this patient and agree with the plan of care seen, counseled, examined and discussed with primary .  Samanvitha Germany L 10/02/2014, 10:25 AM   Subjective:   O2 sats dropping when he ambulates. No dizziness when sitting or SOB. Has RLE edema when he doesn't wear compression stockings.  Chronic RUE edema since AVF put on. Skin tear right arm due to fall last admission.    Objective Filed Vitals:   10/01/14 2345 10/02/14 0000 10/02/14 0035 10/02/14 0541  BP: 90/58 125/63 102/39 110/58  Pulse: 86 91 94 95  Temp:  97.8 F (36.6 C) 98.7 F (37.1 C) 98.8 F (37.1 C)  TempSrc:  Oral Oral Oral  Resp: 17 18 18 18   Height:      Weight:  134.6 kg (296 lb 11.8 oz) 132.5 kg (292 lb 1.8 oz)   SpO2:  96% 95% 98%   Physical Exam all weights are bed weights General: NAD on room air Heart: RRR Gr 2/6 m Lungs: no rales,decreased bs Abdomen: obese soft massive, striae Extremities: right ++ LE edema, ? Early cellulitis  vs inflammation from excess volume Dialysis Access: right  AVF + bruit, right hand and arm swelling 2/2 right innom occlusion  Dialysis Orders: AF MWF EDW 125 180 4 hr 400/800 2k 2.25 Ca right lower AVF with 100% occlusion of right inomminate - waiting on appt at [facility]; heparin 3700 Recent post HD weights 127, 128, 128, 128.5  Additional Objective Labs: Basic Metabolic Panel:  Recent Labs Lab 09/29/14 2230 09/30/14 1150 10/01/14 0524  NA 134* 137 134*  K 3.8 3.9 4.6  CL 96* 96* 92*  CO2 27 32 30  GLUCOSE 235* 156* 120*  BUN 26* 27* 35*  CREATININE 6.73* 8.43* 10.10*  CALCIUM 8.1* 8.9 9.0  PHOS  --   --  7.0*   Liver Function Tests:  Recent Labs Lab 09/29/14 2230 09/30/14 1150 10/01/14 0524  AST 19 18  --   ALT 16* 16*  --   ALKPHOS 92 94  --   BILITOT 0.5 0.2*  --   PROT 6.6 7.3  --   ALBUMIN 2.8* 2.8* 2.7*   CBC:  Recent Labs Lab 09/29/14 2230 09/30/14 1150 10/01/14 0524  WBC 8.8 7.1 7.6  NEUTROABS 6.1 4.7  --   HGB 9.5* 9.6* 9.2*  HCT 30.5* 30.4* 29.5*  MCV 106.6* 105.6* 106.9*  PLT 151 217 211   Blood Culture  Component Value Date/Time   SDES BLOOD LEFT HAND 07/04/2012 2014   SPECREQUEST BOTTLES DRAWN AEROBIC ONLY 4CC 07/04/2012 2014   CULT NO GROWTH 5 DAYS 07/04/2012 2014   REPTSTATUS 07/11/2012 FINAL 07/04/2012 2014    Cardiac Enzymes:  Recent Labs Lab 09/30/14 0002 09/30/14 1150  TROPONINI <0.03 <0.03   CBG:  Recent Labs Lab 10/01/14 0753 10/01/14 1135 10/01/14 1630 10/02/14 0033 10/02/14 0736  GLUCAP 118* 92 168* 190* 273*  Medications:   . allopurinol  200 mg Oral Daily  . antiseptic oral rinse  7 mL Mouth Rinse BID  . aspirin EC  81 mg Oral QPM  . atorvastatin  10 mg Oral QPM  . calcium acetate  1,334 mg Oral TID WC  . [START ON 10/08/2014] darbepoetin (ARANESP) injection - DIALYSIS  60 mcg Intravenous Q Wed-HD  . diphenhydrAMINE  50 mg Intravenous Once  . [START ON 10/03/2014] doxercalciferol  4 mcg Intravenous Q  M,W,F-HD  . enoxaparin (LOVENOX) injection  30 mg Subcutaneous Q24H  . [START ON 10/08/2014] ferric gluconate (FERRLECIT/NULECIT) IV  62.5 mg Intravenous Q Wed-HD  . HYDROmorphone      . insulin pump   Subcutaneous TID AC, HS, 0200  . levothyroxine  100 mcg Oral QAC breakfast  . loratadine  10 mg Oral Daily  . metoCLOPramide  5 mg Oral TID PC & HS  . multivitamin  1 tablet Oral Daily  . prasugrel  10 mg Oral Daily  . rOPINIRole  2 mg Oral Q supper  . sevelamer carbonate  1,600 mg Oral TID WC  . sodium chloride  3 mL Intravenous Q12H

## 2014-10-02 NOTE — Progress Notes (Signed)
[facility] PROGRESS NOTE Interim History: 49 y.o. male with history of ESRD on hemodialysis, CAD status post stenting, chronic anemia, hypertension presents to the ER because of low blood pressure   Assessment/Plan: Dizziness/hypotension: - Antihypertensive medications were held on admission he was given a 250 mL bolus of normal saline. - In his blood pressure has improved. Mildly hypoxic Ct angio rule out PE pending, pt wanted to postpone it. - I will DC her Lasix  And Imdur, BP cont to improved.  Hypoxia: -  With ambulation, CT angio pending. - Echo showed mild diastolic heart failure. Pt is obsessed with OSA does not uses a machine at home.  ESRD (end stage renal disease) on dialysis: - He dialyzes Monday Wednesday and Friday. Nephrology to see.  Nausea: - It is most likely due to hypotension. No resolved. - Abdominal ultrasound showed no acute pathologies.  Prolonged QT: Check a magnesium level no evidence on telemetry.   Diabetes mellitus with nephropathy. Blood glucose has been well controlled on insulin pump.   Code Status: full Family Communication: none  Disposition Plan: home in am   Consultants:  nephrology  Procedures:  Abdominal ultrasound  Chest x-ray  Antibiotics:  none  HPI/Subjective: No complains  Objective: Filed Vitals:   10/02/14 0000 10/02/14 0035 10/02/14 0541 10/02/14 0918  BP: 125/63 102/39 110/58 121/59  Pulse: 91 94 95 93  Temp: 97.8 F (36.6 C) 98.7 F (37.1 C) 98.8 F (37.1 C) 98.7 F (37.1 C)  TempSrc: Oral Oral Oral Oral  Resp: 18 18 18 18   Height:      Weight: 134.6 kg (296 lb 11.8 oz) 132.5 kg (292 lb 1.8 oz)    SpO2: 96% 95% 98% 98%    Intake/Output Summary (Last 24 hours) at 10/02/14 0959 Last data filed at 10/02/14 0919  Gross per 24 hour  Intake    705 ml  Output   1156 ml  Net   -451 ml   Filed Weights   10/01/14 1950 10/02/14 0000 10/02/14 0035  Weight: 136.2 kg (300 lb 4.3 oz) 134.6 kg  (296 lb 11.8 oz) 132.5 kg (292 lb 1.8 oz)    Exam:  General: Alert, awake, oriented x1, in no acute distress.  HEENT: No bruits, no goiter.  Heart: Regular rate and rhythm.  Lungs: Good air movement, clear Abdomen: Soft, nontender, nondistended, positive bowel sounds.  Neuro: Grossly intact, nonfocal.   Data Reviewed: Basic Metabolic Panel:  Recent Labs Lab 09/29/14 2230 09/30/14 1150 10/01/14 0524 10/01/14 1509  NA 134* 137 134*  --   K 3.8 3.9 4.6  --   CL 96* 96* 92*  --   CO2 27 32 30  --   GLUCOSE 235* 156* 120*  --   BUN 26* 27* 35*  --   CREATININE 6.73* 8.43* 10.10*  --   CALCIUM 8.1* 8.9 9.0  --   MG  --   --   --  2.7*  PHOS  --   --  7.0*  --    Liver Function Tests:  Recent Labs Lab 09/29/14 2230 09/30/14 1150 10/01/14 0524  AST 19 18  --   ALT 16* 16*  --   ALKPHOS 92 94  --   BILITOT 0.5 0.2*  --   PROT 6.6 7.3  --   ALBUMIN 2.8* 2.8* 2.7*   No results for input(s): LIPASE, AMYLASE in the last 168 hours. No results for input(s): AMMONIA in the last 168 hours. CBC:  Recent Labs Lab 09/29/14 2230 09/30/14 1150 10/01/14 0524  WBC 8.8 7.1 7.6  NEUTROABS 6.1 4.7  --   HGB 9.5* 9.6* 9.2*  HCT 30.5* 30.4* 29.5*  MCV 106.6* 105.6* 106.9*  PLT 151 217 211   Cardiac Enzymes:  Recent Labs Lab 09/30/14 0002 09/30/14 1150  TROPONINI <0.03 <0.03   BNP (last 3 results) No results for input(s): BNP in the last 8760 hours.  ProBNP (last 3 results) No results for input(s): PROBNP in the last 8760 hours.  CBG:  Recent Labs Lab 10/01/14 0753 10/01/14 1135 10/01/14 1630 10/02/14 0033 10/02/14 0736  GLUCAP 118* 92 168* 190* 273*    Recent Results (from the past 240 hour(s))  MRSA PCR Screening     Status: None   Collection Time: 09/30/14  2:36 PM  Result Value Ref Range Status   MRSA by PCR NEGATIVE NEGATIVE Final    Comment:        The GeneXpert MRSA Assay (FDA approved for NASAL specimens only), is one component of  a comprehensive MRSA colonization surveillance program. It is not intended to diagnose MRSA infection nor to guide or monitor treatment for MRSA infections.      Studies: No results found.  Scheduled Meds: . allopurinol  200 mg Oral Daily  . antiseptic oral rinse  7 mL Mouth Rinse BID  . aspirin EC  81 mg Oral QPM  . atorvastatin  10 mg Oral QPM  . calcium acetate  1,334 mg Oral TID WC  . [START ON 10/08/2014] darbepoetin (ARANESP) injection - DIALYSIS  60 mcg Intravenous Q Wed-HD  . diphenhydrAMINE  50 mg Intravenous Once  . [START ON 10/03/2014] doxercalciferol  4 mcg Intravenous Q M,W,F-HD  . enoxaparin (LOVENOX) injection  30 mg Subcutaneous Q24H  . [START ON 10/08/2014] ferric gluconate (FERRLECIT/NULECIT) IV  62.5 mg Intravenous Q Wed-HD  . HYDROmorphone      . insulin pump   Subcutaneous TID AC, HS, 0200  . levothyroxine  100 mcg Oral QAC breakfast  . loratadine  10 mg Oral Daily  . metoCLOPramide  5 mg Oral TID PC & HS  . multivitamin  1 tablet Oral Daily  . prasugrel  10 mg Oral Daily  . rOPINIRole  2 mg Oral Q supper  . sevelamer carbonate  1,600 mg Oral TID WC  . sodium chloride  3 mL Intravenous Q12H   Continuous Infusions:   Time Spent: 25 min   Charlynne Cousins  [facility] Pager [phone]. If 7PM-7AM, please contact night-coverage at [email or web address], password [facility] 10/02/2014, 9:59 AM  LOS: 1 day

## 2014-10-02 NOTE — Progress Notes (Signed)
Pt received HD treatment. Issues with hypotension. Post treatment patient is alert, no c/o, vss. Report called to primary RN. Patient returned safely to room.

## 2014-10-03 DIAGNOSIS — I952 Hypotension due to drugs: Secondary | ICD-10-CM

## 2014-10-03 LAB — CBC
HCT: 29 % — ABNORMAL LOW (ref 39.0–52.0)
Hemoglobin: 9.3 g/dL — ABNORMAL LOW (ref 13.0–17.0)
MCH: 33.2 pg (ref 26.0–34.0)
MCHC: 32.1 g/dL (ref 30.0–36.0)
MCV: 103.6 fL — ABNORMAL HIGH (ref 78.0–100.0)
Platelets: 234 10*3/uL (ref 150–400)
RBC: 2.8 MIL/uL — ABNORMAL LOW (ref 4.22–5.81)
RDW: 15 % (ref 11.5–15.5)
WBC: 12.9 10*3/uL — ABNORMAL HIGH (ref 4.0–10.5)

## 2014-10-03 LAB — RENAL FUNCTION PANEL
Albumin: 2.7 g/dL — ABNORMAL LOW (ref 3.5–5.0)
Anion gap: 12 (ref 5–15)
BUN: 31 mg/dL — ABNORMAL HIGH (ref 6–20)
CO2: 21 mmol/L — ABNORMAL LOW (ref 22–32)
Calcium: 8.5 mg/dL — ABNORMAL LOW (ref 8.9–10.3)
Chloride: 95 mmol/L — ABNORMAL LOW (ref 101–111)
Creatinine, Ser: 9 mg/dL — ABNORMAL HIGH (ref 0.61–1.24)
GFR calc Af Amer: 7 mL/min — ABNORMAL LOW (ref >60)
GFR calc non Af Amer: 6 mL/min — ABNORMAL LOW (ref >60)
Glucose, Bld: 213 mg/dL — ABNORMAL HIGH (ref 65–99)
Phosphorus: 3.6 mg/dL (ref 2.5–4.6)
Potassium: 4.8 mmol/L (ref 3.5–5.1)
Sodium: 128 mmol/L — ABNORMAL LOW (ref 135–145)

## 2014-10-03 LAB — GLUCOSE, CAPILLARY
Glucose-Capillary: 153 mg/dL — ABNORMAL HIGH (ref 65–99)
Glucose-Capillary: 98 mg/dL (ref 65–99)
Glucose-Capillary: 108 mg/dL — ABNORMAL HIGH (ref 65–99)

## 2014-10-03 MED ORDER — HYDROMORPHONE HCL 1 MG/ML IJ SOLN
INTRAMUSCULAR | Status: AC
  Administered 2014-10-03: 1 mg via INTRAVENOUS
  Filled 2014-10-03: qty 1

## 2014-10-03 MED ORDER — ONDANSETRON HCL 4 MG/2ML IJ SOLN: 4.0000 mg | Freq: Once | INTRAMUSCULAR | Status: DC

## 2014-10-03 MED ORDER — DOXERCALCIFEROL 4 MCG/2ML IV SOLN
INTRAVENOUS | Status: AC
  Administered 2014-10-03: 4 ug via INTRAVENOUS
  Filled 2014-10-03: qty 2

## 2014-10-03 MED ORDER — HYDROMORPHONE HCL 1 MG/ML IJ SOLN
1.0000 mg | Freq: Once | INTRAMUSCULAR | Status: AC
  Administered 2014-10-03: 1 mg via INTRAVENOUS

## 2014-10-03 MED ORDER — ONDANSETRON HCL 4 MG/2ML IJ SOLN
INTRAMUSCULAR | Status: AC
  Filled 2014-10-03: qty 2

## 2014-10-03 NOTE — Discharge Summary (Signed)
Physician Discharge Summary  [name] [id number] DOB: 02-14-1966 DOA: 09/29/2014  PCP: Windy Kalata, MD  Admit date: 09/29/2014 Discharge date: 10/03/2014  Time spent: 35 minutes  Recommendations for Outpatient Follow-up:  1. Follow up with renal as an outpatient  Discharge Diagnoses:  Principal Problem:   Dizziness Active Problems:   ESRD (end stage renal disease)   Hypoxia   Hypotension   ESRD (end stage renal disease) on dialysis   Discharge Condition: stable  Diet recommendation: renal  Filed Weights   10/02/14 0035 10/02/14 1007 10/03/14 0708  Weight: 132.5 kg (292 lb 1.8 oz) 130.001 kg (286 lb 9.6 oz) 132 kg (291 lb 0.1 oz)    History of present illness:  49 y.o. male with history of ESRD on hemodialysis, CAD status post stenting, chronic anemia, hypertension presents to the ER because of low blood pressure. Patient states he had low blood pressure yesterday while having dialysis but felt okay and went home. Later in the evening patient was feeling dizzy and when he measured his blood pressure was stable around 80 systolic and he came to the ER. In the ER patient was given 2 50 mL normal saline bolus following which patient's blood pressure improved but when patient was made to walk patient was still having low blood pressure. Patient has been admitted for further observation. Patient denies any chest pain shortness of breath but has some nausea when he tries to eat. Abdomen appears benign. Patient also was found to be mildly hypoxic in the ER requiring oxygen.   Hospital Course:  Dizziness/hypotension: - Antihypertensive medications were held on admission he was given a 250 mL bolus of normal saline. - His blood pressure  improved. Mildly hypoxic Ct angio rule out PE pending, showed no PE> - I  Dc'd his Lasix And Imdur, his Bp was well controlled < 130/90 without this medications.  Hypoxia: -  CT angio showed no PE. - Echo showed mild diastolic heart  failure. Pt is obsessed with OSA does not uses a machine at home. - he sat then remained > 90 % will need to were his C-pap at home.  ESRD (end stage renal disease) on dialysis: - He dialyzes Monday Wednesday and Friday. Nephrology to see.  Nausea: - It is most likely due to hypotension. No resolved. - Abdominal ultrasound showed no acute pathologies.  Prolonged QT: Check a magnesium level no evidence on telemetry.  Diabetes mellitus with nephropathy. Blood glucose has been well controlled on insulin pump.    Procedures: CT angio: No acute finding identified. Atherosclerosis with left main and 3 vessel coronary artery disease. Multiple venous collaterals on the right chest wall, indicating an element of venous stenosis/ obstruction. There is narrowing of the right subclavian vein as it traverses the space between the first rib and clavicle, though CT is poorly sensitive/specific for the evaluation of venous obstruction. Correlation with venous duplex of the right upper extremity may be useful. Evidence of developing pulmonary hypertension. Referral for pulmonary evaluation may be useful.    Consultations:  renal  Discharge Exam: Filed Vitals:   10/03/14 0800  BP: 103/39  Pulse: 112  Temp:   Resp: 23    General: A&O x3 Cardiovascular: RRR Respiratory: good air movement CTA B/L  Discharge Instructions   Discharge Instructions    Diet - low sodium heart healthy    Complete by:  As directed      Increase activity slowly    Complete by:  As directed  Current Discharge Medication List    START taking these medications   Details  ondansetron (ZOFRAN) 4 MG tablet Take 1 tablet (4 mg total) by mouth every 8 (eight) hours as needed for nausea or vomiting. Qty: 6 tablet, Refills: 0      CONTINUE these medications which have NOT CHANGED   Details  allopurinol (ZYLOPRIM) 100 MG tablet Take 200 mg by mouth daily.     aspirin 81 MG tablet Take 81 mg by  mouth every evening.     atorvastatin (LIPITOR) 10 MG tablet Take 10 mg by mouth every evening.     calcium acetate (PHOSLO) 667 MG capsule Take 667-1,334 mg by mouth See admin instructions. Take 1,334 mg (2 capsules) with meals and 667 (1 capsule) with snacks    diphenhydrAMINE (BENADRYL) 25 mg capsule Take 1-2 capsules (25-50 mg total) by mouth every 6 (six) hours as needed for itching. Qty: 30 capsule, Refills: 0    docusate sodium (COLACE) 100 MG capsule Take 200 mg by mouth 3 (three) times daily as needed for mild constipation.     GLUCAGON EMERGENCY 1 MG injection Inject 1 mg into the muscle daily as needed (low blood sugar).     HYDROmorphone (DILAUDID) 2 MG tablet Take 2 mg by mouth 3 (three) times daily as needed for severe pain.     Insulin Human (INSULIN PUMP) 100 unit/ml SOLN Inject into the skin continuous. Humalog    levothyroxine (SYNTHROID, LEVOTHROID) 100 MCG tablet Take 100 mcg by mouth daily before breakfast.    loratadine (CLARITIN) 10 MG tablet Take 10 mg by mouth daily.    metoCLOPramide (REGLAN) 5 MG tablet Take 5 mg by mouth 4 (four) times daily - after meals and at bedtime.    metoprolol succinate (TOPROL-XL) 25 MG 24 hr tablet Take 0.5 tablets (12.5 mg total) by mouth daily. Qty: 15 tablet, Refills: 12    multivitamin (RENA-VIT) TABS tablet Take 1 tablet by mouth daily.    nitroGLYCERIN (NITROSTAT) 0.4 MG SL tablet Place 0.4 mg under the tongue every 5 (five) minutes as needed for chest pain.     promethazine (PHENERGAN) 25 MG tablet Take 25 mg by mouth Every 6 hours as needed for nausea or vomiting. For nausea    rOPINIRole (REQUIP) 2 MG tablet Take 2 mg by mouth daily with supper.     sevelamer carbonate (RENVELA) 800 MG tablet Take 1,600 mg by mouth 3 (three) times daily with meals.    prasugrel (EFFIENT) 10 MG TABS tablet Take 1 tablet (10 mg total) by mouth daily. Qty: 30 tablet, Refills: 3   Associated Diagnoses: Dyspnea      STOP taking these  medications     cephALEXin (KEFLEX) 500 MG capsule      furosemide (LASIX) 80 MG tablet      isosorbide mononitrate (IMDUR) 30 MG 24 hr tablet      sulfamethoxazole-trimethoprim (BACTRIM DS,SEPTRA DS) 800-160 MG per tablet        Allergies  Allergen Reactions  . Doxycycline     RASH  . Hydrocodone     RASH  . Morphine And Related Hives and Nausea Only  . Amoxicillin Other (See Comments)    Reaction unknown  . Ciprofloxacin Other (See Comments)    Reaction unknown  . Clindamycin/Lincomycin Other (See Comments)    Reaction unknown  . Codeine Other (See Comments)    Reaction unknown. But pt has tolerated hydrocodone in Vicodin and Norco in the past  .  Levofloxacin Other (See Comments)    Reaction unknown  . Rocephin [Ceftriaxone] Itching  . Vasotec Cough  . Contrast Media [Iodinated Diagnostic Agents] Rash  . Oxycodone Rash  . Plavix [Clopidogrel Bisulfate] Rash   Follow-up Information    Follow up with Windy Kalata, MD. Call on 10/01/2014.   Specialty:  Nephrology   Contact information:   [place] [id number] [phone]       Go to [place].   Specialty:  Emergency Medicine   Why:  If symptoms worsen   Contact information:   [id number] [id number] [place] [phone]       The results of significant diagnostics from this hospitalization (including imaging, microbiology, ancillary and laboratory) are listed below for reference.    Significant Diagnostic Studies: Dg Chest 2 View  09/30/2014   CLINICAL DATA:  Hypotension.  Weakness.  EXAM: CHEST  2 VIEW  COMPARISON:  09/17/2014  FINDINGS: Lower lung volumes from prior exam which in combination with soft tissue attenuation from body habitus limits assessment. Increased right infrahilar markings. Cardiomegaly is grossly stable allowing for differences in technique. Question of increased bronchial thickening versus  lower lung volumes. No large pleural effusion or pneumothorax.  IMPRESSION: Lower lung volumes with increased right infrahilar markings, likely atelectasis, and question of bronchial thickening.   Electronically Signed   By: Jeb Levering M.D.   On: 09/30/2014 01:43   Dg Chest 2 View  09/17/2014   CLINICAL DATA:  Nauseous prior to dialysis with a weakness today. Substernal chest pressure and lightheadedness.  EXAM: CHEST  2 VIEW  COMPARISON:  05/13/2014  FINDINGS: Lungs are adequately inflated without consolidation or effusion. Mild stable cardiomegaly. Degenerative changes of the spine.  IMPRESSION: No active cardiopulmonary disease.  Stable cardiomegaly.   Electronically Signed   By: Marin Olp M.D.   On: 09/17/2014 14:21   Dg Forearm Right  09/24/2014   CLINICAL DATA:  Wound infection. Fall 4 days prior with laceration, now with redness.  EXAM: RIGHT FOREARM - 2 VIEW  COMPARISON:  None.  FINDINGS: Cortical margins of the radius ulna are intact. There is no fracture. No erosion or periosteal reaction. Cystic changes noted in the scaphoid. There are dense vascular calcifications. Soft tissue edema about the forearm without soft tissue air or radiopaque foreign body.  IMPRESSION: Soft tissue edema without soft tissue air or radiopaque foreign body. No acute osseous abnormality or findings to suggest osteomyelitis.   Electronically Signed   By: Jeb Levering M.D.   On: 09/24/2014 01:08   Ct Angio Chest Pe W/cm &/or Wo Cm  10/02/2014   CLINICAL DATA:  49 year old male with a history of upper chest pain and shortness of breath.  EXAM: CT ANGIOGRAPHY CHEST WITH CONTRAST  TECHNIQUE: Multidetector CT imaging of the chest was performed using the standard protocol during bolus administration of intravenous contrast. Multiplanar CT image reconstructions and MIPs were obtained to evaluate the vascular anatomy.  CONTRAST:  58mL OMNIPAQUE IOHEXOL 350 MG/ML SOLN  COMPARISON:  Chest x-ray 09/30/2014  FINDINGS:  Chest:  Multiple collateral veins on the right chest wall.  Bilateral gynecomastia.  No axillary or supraclavicular adenopathy.  Attenuated appearance of the right internal jugular vein at the thoracic inlet.  Unremarkable appearance of the thyroid.  Heart size borderline enlarged. No pericardial fluid/ thickening. Calcifications of the left main, left anterior descending, circumflex, right coronary arteries.  Minimal calcifications of the mitral annulus.  Multiple lymph nodes within the mediastinum.  There are a few ill-defined lymph nodes in the right peritracheal nodes and pericardial fat which are borderline enlarged. Greatest measures 15 mm in greatest diameter. No comparison is available.  Unremarkable appearance of the thoracic esophagus. No hiatal hernia.  No aneurysm or dissection flap with normal course caliber and contour of the thoracic aorta. Descending thoracic aorta unremarkable. No periaortic fluid or inflammatory changes. Four vessel arch, with separate origin of the vertebral artery from the aortic arch. Minimal calcifications of the branch vessels.  There is attenuated appearance of the right subclavian vein at the level of the first rib and clavicle. Contrast bolus from the left, with attenuated appearance of the left subclavian vein.  Timing of the contrast bolus limits evaluation for pulmonary emboli, however, there are no central or lobar pulmonary emboli.  Minimal ground-glass opacities of the lungs compatible of atelectasis/ scarring. No confluent airspace disease. No pneumothorax. No pleural effusion.  The pulmonary arteries are larger in diameter than the accompanying bronchi at the peripheral third of the lung, with the pulmonary arteries prominent to the outer 1/3.  Upper abdomen:  Unremarkable appearance of the upper abdomen.  No displaced fracture identified. Sclerotic appearance of the vertebral bodies, likely reflects renal osteodystrophy given the patient's history of renal  failure/transplant. No significant degenerative changes of the spine identified.  Review of the MIP images confirms the above findings.  IMPRESSION: No acute finding identified.  Atherosclerosis with left main and 3 vessel coronary artery disease.  Multiple venous collaterals on the right chest wall, indicating an element of venous stenosis/ obstruction. There is narrowing of the right subclavian vein as it traverses the space between the first rib and clavicle, though CT is poorly sensitive/specific for the evaluation of venous obstruction. Correlation with venous duplex of the right upper extremity may be useful.  Evidence of developing pulmonary hypertension. Referral for pulmonary evaluation may be useful.  Borderline enlarged mediastinal lymph nodes, potentially reactive. Correlation with lab values (CBC with differential) may be useful if there is concern for lymphoproliferative process.  Signed,  Dulcy Fanny. Earleen Newport, DO  [facility]  [facility]   Electronically Signed   By: Corrie Mckusick D.O.   On: 10/02/2014 16:08   US Abdomen Complete  09/30/2014   CLINICAL DATA:  Nausea, right-sided abdominal pain to palpation, hypotension; history of end-stage renal disease and remote history of renal transplant with subsequent transplant failure, diabetes, coronary artery disease, morbid obesity.  EXAM: ULTRASOUND ABDOMEN COMPLETE  COMPARISON:  Abdominal ultrasound of December 24, 2013  FINDINGS: Gallbladder: The gallbladder is distended. No stones are evident. There is no pericholecystic fluid or gallbladder wall thickening. There is no positive sonographic Murphy's sign.  Common bile duct: Diameter: 3.8 mm  Liver: The liver exhibits normal echotexture with no focal mass or ductal dilation.  IVC: Bowel gas limited evaluation of the inferior vena cava.  Pancreas: Bowel gas obscured the pancreatic head and tail.  Spleen: Size and appearance within normal limits.  Right Kidney:  Length: 12.6 cm. There increased renal cortical echotexture diffusely. There is no hydronephrosis.  Left Kidney: Length: 11.2 cm. Bowel gas limited evaluation of the left renal parenchyma.  Abdominal aorta: Bowel gas partially obscured the abdominal aorta  Other findings: In the left aspect of the pelvis the failed renal transplanted kidney is demonstrated. It measures 12 cm in length.  IMPRESSION: 1. Mild gallbladder distention without sonographic evidence of stones or acute cholecystitis. If  gallbladder dysfunction is suspected clinically, nuclear medicine hepatobiliary scan may be useful. 2. The liver and common bile duct exhibit no acute abnormalities. Evaluation of the pancreas is limited by bowel gas. 3. Increased echogenicity of both kidneys consistent with known medical renal disease. The transplanted kidney also demonstrates increased echogenicity consistent with its new with known transplant failure. 4. If the patient's clinical status remains unclear, abdominal pelvic CT scanning may be the most useful next imaging steps.   Electronically Signed   By: David  Martinique M.D.   On: 09/30/2014 07:46    Microbiology: Recent Results (from the past 240 hour(s))  MRSA PCR Screening     Status: None   Collection Time: 09/30/14  2:36 PM  Result Value Ref Range Status   MRSA by PCR NEGATIVE NEGATIVE Final    Comment:        The GeneXpert MRSA Assay (FDA approved for NASAL specimens only), is one component of a comprehensive MRSA colonization surveillance program. It is not intended to diagnose MRSA infection nor to guide or monitor treatment for MRSA infections.      Labs: Basic Metabolic Panel:  Recent Labs Lab 09/29/14 2230 09/30/14 1150 10/01/14 0524 10/01/14 1509 10/03/14 0818  NA 134* 137 134*  --  128*  K 3.8 3.9 4.6  --  4.8  CL 96* 96* 92*  --  95*  CO2 27 32 30  --  21*  GLUCOSE 235* 156* 120*  --  213*  BUN 26* 27* 35*  --  31*  CREATININE 6.73* 8.43* 10.10*  --  9.00*   CALCIUM 8.1* 8.9 9.0  --  8.5*  MG  --   --   --  2.7*  --   PHOS  --   --  7.0*  --  3.6   Liver Function Tests:  Recent Labs Lab 09/29/14 2230 09/30/14 1150 10/01/14 0524 10/03/14 0818  AST 19 18  --   --   ALT 16* 16*  --   --   ALKPHOS 92 94  --   --   BILITOT 0.5 0.2*  --   --   PROT 6.6 7.3  --   --   ALBUMIN 2.8* 2.8* 2.7* 2.7*   No results for input(s): LIPASE, AMYLASE in the last 168 hours. No results for input(s): AMMONIA in the last 168 hours. CBC:  Recent Labs Lab 09/29/14 2230 09/30/14 1150 10/01/14 0524 10/03/14 0818  WBC 8.8 7.1 7.6 12.9*  NEUTROABS 6.1 4.7  --   --   HGB 9.5* 9.6* 9.2* 9.3*  HCT 30.5* 30.4* 29.5* 29.0*  MCV 106.6* 105.6* 106.9* 103.6*  PLT 151 217 211 234   Cardiac Enzymes:  Recent Labs Lab 09/30/14 0002 09/30/14 1150  TROPONINI <0.03 <0.03   BNP: BNP (last 3 results) No results for input(s): BNP in the last 8760 hours.  ProBNP (last 3 results) No results for input(s): PROBNP in the last 8760 hours.  CBG:  Recent Labs Lab 10/02/14 0736 10/02/14 1240 10/02/14 1707 10/02/14 2124 10/03/14 0754  GLUCAP 273* 177* 190* 189* 153*       Signed:  FELIZ ORTIZ, Sherra Kimmons  [facility] 10/03/2014, 9:40 AM

## 2014-10-03 NOTE — Progress Notes (Signed)
Subjective:   Seen on dialysis, nausea this morning, improving, no dyspnea  Objective: Vital signs in last 24 hours: Temp:  [98.5 F (36.9 C)-99 F (37.2 C)] 98.6 F (37 C) (06/10 0708) Pulse Rate:  [88-112] 112 (06/10 0800) Resp:  [17-23] 23 (06/10 0800) BP: (103-135)/(34-59) 103/39 mmHg (06/10 0800) SpO2:  [93 %-98 %] 98 % (06/10 0708) Weight:  [130.001 kg (286 lb 9.6 oz)-132 kg (291 lb 0.1 oz)] 132 kg (291 lb 0.1 oz) (06/10 0708) Weight change: -6.199 kg (-13 lb 10.7 oz)  Intake/Output from previous day: 06/09 0701 - 06/10 0700 In: 460 [P.O.:460] Out: 0  Intake/Output this shift:   Lab Results:  Recent Labs  10/01/14 0524 10/03/14 0818  WBC 7.6 12.9*  HGB 9.2* 9.3*  HCT 29.5* 29.0*  PLT 211 234   BMET:  Recent Labs  09/30/14 1150 10/01/14 0524  NA 137 134*  K 3.9 4.6  CL 96* 92*  CO2 32 30  GLUCOSE 156* 120*  BUN 27* 35*  CREATININE 8.43* 10.10*  CALCIUM 8.9 9.0  ALBUMIN 2.8* 2.7*   No results for input(s): PTH in the last 72 hours. Iron Studies: No results for input(s): IRON, TIBC, TRANSFERRIN, FERRITIN in the last 72 hours.  Studies/Results: Ct Angio Chest Pe W/cm &/or Wo Cm  10/02/2014   CLINICAL DATA:  49 year old male with a history of upper chest pain and shortness of breath.  EXAM: CT ANGIOGRAPHY CHEST WITH CONTRAST  TECHNIQUE: Multidetector CT imaging of the chest was performed using the standard protocol during bolus administration of intravenous contrast. Multiplanar CT image reconstructions and MIPs were obtained to evaluate the vascular anatomy.  CONTRAST:  54mL OMNIPAQUE IOHEXOL 350 MG/ML SOLN  COMPARISON:  Chest x-ray 09/30/2014  FINDINGS: Chest:  Multiple collateral veins on the right chest wall.  Bilateral gynecomastia.  No axillary or supraclavicular adenopathy.  Attenuated appearance of the right internal jugular vein at the thoracic inlet.  Unremarkable appearance of the thyroid.  Heart size borderline enlarged. No pericardial fluid/  thickening. Calcifications of the left main, left anterior descending, circumflex, right coronary arteries.  Minimal calcifications of the mitral annulus.  Multiple lymph nodes within the mediastinum.  There are a few ill-defined lymph nodes in the right peritracheal nodes and pericardial fat which are borderline enlarged. Greatest measures 15 mm in greatest diameter. No comparison is available.  Unremarkable appearance of the thoracic esophagus. No hiatal hernia.  No aneurysm or dissection flap with normal course caliber and contour of the thoracic aorta. Descending thoracic aorta unremarkable. No periaortic fluid or inflammatory changes. Four vessel arch, with separate origin of the vertebral artery from the aortic arch. Minimal calcifications of the branch vessels.  There is attenuated appearance of the right subclavian vein at the level of the first rib and clavicle. Contrast bolus from the left, with attenuated appearance of the left subclavian vein.  Timing of the contrast bolus limits evaluation for pulmonary emboli, however, there are no central or lobar pulmonary emboli.  Minimal ground-glass opacities of the lungs compatible of atelectasis/ scarring. No confluent airspace disease. No pneumothorax. No pleural effusion.  The pulmonary arteries are larger in diameter than the accompanying bronchi at the peripheral third of the lung, with the pulmonary arteries prominent to the outer 1/3.  Upper abdomen:  Unremarkable appearance of the upper abdomen.  No displaced fracture identified. Sclerotic appearance of the vertebral bodies, likely reflects renal osteodystrophy given the patient's history of renal failure/transplant. No significant degenerative changes of the spine  identified.  Review of the MIP images confirms the above findings.  IMPRESSION: No acute finding identified.  Atherosclerosis with left main and 3 vessel coronary artery disease.  Multiple venous collaterals on the right chest wall, indicating  an element of venous stenosis/ obstruction. There is narrowing of the right subclavian vein as it traverses the space between the first rib and clavicle, though CT is poorly sensitive/specific for the evaluation of venous obstruction. Correlation with venous duplex of the right upper extremity may be useful.  Evidence of developing pulmonary hypertension. Referral for pulmonary evaluation may be useful.  Borderline enlarged mediastinal lymph nodes, potentially reactive. Correlation with lab values (CBC with differential) may be useful if there is concern for lymphoproliferative process.  Signed,  Dulcy Fanny. Earleen Newport, DO  [facility]  [facility]   Electronically Signed   By: Corrie Mckusick D.O.   On: 10/02/2014 16:08   EXAM: General appearance:  Alert, in no apparent distress Resp:  CTA without rales, rhonchi, or wheezes Cardio:  RRR with Gr II/VI systolic murmur, no rub GI: Obese with striae, + BS, soft and nontender Extremities:  1+ edema on R LE, L BKA Access:  AVF @ RUA with BFR 400  Dialysis Orders: AF MWF EDW 125 180 4 hr 400/800 2k 2.25 Ca right lower AVF with 100% occlusion of right inomminate - waiting on appt at [facility]; heparin 3700 Recent post HD weights 127, 128, 128, 128.5  Assessment/Plan: 1. Dizziness - orthostasis with BP drops on HD and standing post-HD, regardless of goal, severe LVH (EF 55-60%), AM cortisol low @ 5.4; CT angio yesterday with no acute finding, but developing pulmonary HTN. 2. ESRD - HD on MWF @ AF, K 4.8.  HD today. 3. Hypotension/Volume - BP 103/39 on HD; UF goal 4 L today. 4. Anemia - Hgb 9.3, Aranesp 60 mcg on Wed, weekly Fe. 5. Sec HPT - Ca 8.5 (9.5 corrected), P 3.6; Hectorol 4 mcg, Renvela 2 & Phoslo 2 with meals. 6. Nutrition - Alb 2.7, renal carb-mod diet, vitamin. 7. Morbid obesity  8. PVD - s/p L BKA 9. DM - per primary 10. 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 11. Hypoxia -  CT angio with evidence of developing pulmonary HTN. 12. Massive obesity suspect cause of pulm HTN is OSA and chronic vol xs 13. NOnadherence   LOS: 2 days   LYLES,CHARLES 10/03/2014,8:54 AM I have seen and examined this patient and agree with the plan of care seen, eval,counseled patient, discussed with primary MD .  Leontina Skidmore L 10/03/2014, 3:54 PM

## 2014-10-03 NOTE — Procedures (Signed)
I was present at this session.  I have reviewed the session itself and made appropriate changes.  Vol xs bp 120-130s.  See if can get down.    Dale [name] L 6/10/20169:12 AM

## 2014-10-03 NOTE — Progress Notes (Signed)
Patient Discharge:  Disposition: Pt discharged home with wife  Education: Pt educated on medications, follow up appointments, and all discharge instructions. Pt verbalized understanding.   IV: Removed  Telemetry: Removed  Follow-up appointments: Reviewed with pt  Prescriptions: N/A  Transportation: Transported home by wife  Belongings: All belongings taken with patient

## 2014-10-08 ENCOUNTER — Other Ambulatory Visit: Payer: Self-pay | Admitting: Physician Assistant

## 2014-10-20 ENCOUNTER — Other Ambulatory Visit: Payer: Self-pay

## 2014-10-28 ENCOUNTER — Telehealth: Payer: Self-pay | Admitting: Cardiovascular Disease

## 2014-10-28 NOTE — Telephone Encounter (Signed)
NewMessage  Pt calling about samples of Effient, Please call back and discuss.

## 2014-10-29 NOTE — Telephone Encounter (Signed)
Samples of Effient 10mg  5 bottles provided per patient request. He has difficulty paying for it every month.

## 2014-11-06 ENCOUNTER — Ambulatory Visit (INDEPENDENT_AMBULATORY_CARE_PROVIDER_SITE_OTHER): Payer: Medicare Other | Admitting: Vascular Surgery

## 2014-11-06 ENCOUNTER — Encounter: Payer: Self-pay | Admitting: Vascular Surgery

## 2014-11-06 VITALS — BP 142/56 | HR 90 | Ht 68.0 in | Wt 291.2 lb

## 2014-11-06 DIAGNOSIS — T829XXA Unspecified complication of cardiac and vascular prosthetic device, implant and graft, initial encounter: Secondary | ICD-10-CM | POA: Diagnosis not present

## 2014-11-06 NOTE — Progress Notes (Signed)
Referred by:  Fleet Contras, MD [place], [place] [address]  Reason for referral: Scab over right upper arm fistula.   History of Present Illness  Dale [name] is a 49 y.o. ([birth year]) male who presents as an add-on. He was sent here by his nephrologist Dr. Mercy Moore due to concerns regarding a scab over his right brachiocephalic AV fistula (created 07/29/11 by Dr. Kellie Simmering). This scab has been present for two months and has been stable. He denies any bleeding episodes. He reports chronic swelling of his right arm. He dialyzes on Mondays, Wednesdays and Fridays. He reports no issues with dialysis and has had adequate flow rates. He has a known left subclavian stenosis. He denies any known central venous stenosis. He was previously seen at [facility] and scheduled for removal of right first rib for correction. This surgery did not happen however due to inability to stent the artery. He subsequently saw Dr. Augustin Coupe at [facility] who performed a fistulogram and referred the patient to [facility] for intervention. No appointment was made so the patient has not had any intervention performed.   He has a history of CAD s/p PTCA/DES x 1 to mid LAD and PTCA of distal RCA by Dr. [name]. He is currently on Effiant therapy.   Other access surgeries include: left forearm loop graft. Cannulation of this graft was discontinued due to significant swelling of his left arm after use. He also had a failed left radiocephalic fistula. He has not had any imaging of his left upper extremity to evaluate for central venous stenosis. He also had right thigh graft that was removed secondary to infection.   He had a renal transplant that was successful for twenty years and subsequently failed. He has a left below knee amputation and uses a prosthesis. He reports having a left toe amputation due to infection. He had an iatrogenic injury to his ankle ligament during his surgery.  Unfortunately reconstruction of his ankle was not  deemed possible due to his significant past medical history and he was recommended to have a below knee amputation. He also suffers from venous stasis ulcers and swelling and has gone to the wound care center in the past. He is now self treating with over the counter unna boots.   He has a past medical history of diabetes on an insulin pump. He also has hypertension, hyperlipidemia and morbid obesity.   Of note, he was recently hospitalized last month secondary to hypotension and hypoxia. He was discharged after three days. His ECHO showed mild diastolic heart failure. His CTA was negative for PE.    Past Medical History  Diagnosis Date  . Hypertension   . History of blood transfusion     "related to dialysis"  . Dyspnea     a. 07/23/11 Echo: EF 55-60%  . Anemia of chronic disease   . Cholecystitis     a. 08/27/2011  . Constipation   . Pericardial effusion     a.  Small by CT 08/22/11;  b.  Large by CT 08/27/11  . Inguinal lymphadenopathy     a. bilateral - s/p biopsy 07/2011  . Fibromyalgia   . Hx of echocardiogram     Echo (11/15):  Mild LVH, EF 60-65%, no RWMA, Gr 1 DD, MAC, mild LAE, normal RVF, mild RAE  . Hx of cardiovascular stress test     Lexiscan Myoview (11/15):  Inf, apical cap and apical lateral ischemia, EF 56%, inf HK, Moderate Risk  .  High cholesterol   . Pneumonia ~ 2007  . OSA (obstructive sleep apnea)     "I'm not doing the mask; claustrophobic"  . Claustrophobia   . Hypothyroidism   . Insulin dependent diabetes mellitus   . GERD (gastroesophageal reflux disease)   . Arthritis     "hands, right knee" (03/19/2014)  . ESRD (end stage renal disease)     a. 1995 s/p cadaveric transplant w/ susbequent failure after 18 yrs;  b.Dialysis initiated 07/2011  . CAD (coronary artery disease)     a. LHC (11/15):  mid to dist Dx 80%, mid RCA 99% (functional CTO), dist RCA 80% >> PCI: balloon angioplasty to mid RCA (could not deliver stent) - plan staged PCI with HSRA of RCA; Dx  to be tx medically     Past Surgical History  Procedure Laterality Date  . Kidney transplant  04/25/1993  . Cataract extraction w/ intraocular lens  implant, bilateral Bilateral 1990's  . Arteriovenous graft placement Left 1993?    forearm  . Arteriovenous graft placement Right 1993?    leg  . Toe amputation Bilateral after 2005  . Below knee leg amputation Left 2010  . Carpal tunnel release Bilateral after 2005  . Shoulder arthroscopy Left ~ 2006    frozen  . Av fistula placement  07/29/2011    Procedure: ARTERIOVENOUS (AV) FISTULA CREATION;  Surgeon: Mal Misty, MD;  Location: [place];  Service: Vascular;  Laterality: Right;  Brachial cephalic  . Insertion of dialysis catheter  08/01/2011    Procedure: INSERTION OF DIALYSIS CATHETER;  Surgeon: Rosetta Posner, MD;  Location: [place];  Service: Vascular;  Laterality: Right;  insertion of dialysis catheter on right internal jugular vein  . Lymph node biopsy  08/10/2011    Procedure: LYMPH NODE BIOPSY;  Surgeon: Merrie Roof, MD;  Location: [place];  Service: General;  Laterality: Left;  left groin lymph biopsy  . Colonoscopy  08/29/2011    Procedure: COLONOSCOPY;  Surgeon: Jerene Bears, MD;  Location: [place];  Service: Gastroenterology;  Laterality: N/A;  . Appendectomy  ~ 2006  . Hernia repair    . Umbilical hernia repair  1990's  . Cardiac catheterization  03/19/2014  . Av fistula placement Left 1993?    "attempted one in my wrist; didn't take"  . Neuroplasty / transposition ulnar nerve at elbow Left after 2005  . Venogram Right 07/28/2011    Procedure: VENOGRAM;  Surgeon: Conrad Ball, MD;  Location: [facility] CATH LAB;  Service: Cardiovascular;  Laterality: Right;  . Left heart catheterization with coronary angiogram N/A 03/19/2014    Procedure: LEFT HEART CATHETERIZATION WITH CORONARY ANGIOGRAM;  Surgeon: Burnell Blanks, MD;  Location: [facility] C[facility] LAB;  Service: Cardiovascular;  Laterality: N/A;  . Cardiac catheterization  03/19/2014     Procedure: CORONARY BALLOON ANGIOPLASTY;  Surgeon: Burnell Blanks, MD;  Location: [facility] CATH LAB;  Service: Cardiovascular;;  . Coronary angioplasty with stent placement  04/09/2014        ptca/des mid lad   . Angioplasty  04/09/2014    RCA   . Femoral artery repair  04/09/2014    ANGIOSEAL  . Eye surgery    . Percutaneous coronary rotoblator intervention (pci-r) N/A 04/09/2014    Procedure: PERCUTANEOUS CORONARY ROTOBLATOR INTERVENTION (PCI-R);  Surgeon: Burnell Blanks, MD;  [facility] C[facility] LAB;  Service: Cardiovascular;  Laterality: N/A;    History   Social History  . Marital Status: Married  Spouse Name: N/A  . Number of Children: N/A  . Years of Education: N/A   Occupational History  . Disabled    Social History Main Topics  . Smoking status: Never Smoker   . Smokeless tobacco: Never Used  . Alcohol Use: Yes     Comment: 03/19/2014 "might have a drink 2-3 times/year"  . Drug Use: No  . Sexual Activity: Not Currently   Other Topics Concern  . Not on file   Social History Narrative   Lives @ home with his wife in [place].     Family History  Problem Relation Age of Onset  . Malignant hyperthermia Neg Hx   . Heart attack Neg Hx   . Stroke Neg Hx   . Colon polyps Father   . Diabetes Father   . Cancer Father   . Hypertension Father   . Breast cancer Maternal Aunt   . Cancer Maternal Aunt     Breast and Bone  . Hypertension Mother   . Diabetes Mother   . Hyperlipidemia Mother      Current Outpatient Prescriptions on File Prior to Visit  Medication Sig Dispense Refill  . allopurinol (ZYLOPRIM) 100 MG tablet Take 200 mg by mouth daily.     [name] Kitchen aspirin 81 MG tablet Take 81 mg by mouth every evening.     [name] Kitchen atorvastatin (LIPITOR) 10 MG tablet Take 10 mg by mouth every evening.     . calcium acetate (PHOSLO) 667 MG capsule Take 667-1,334 mg by mouth See admin instructions. Take 1,334 mg (2 capsules) with meals and 667 (1 capsule) with snacks      . diphenhydrAMINE (BENADRYL) 25 mg capsule Take 1-2 capsules (25-50 mg total) by mouth every 6 (six) hours as needed for itching. 30 capsule 0  . docusate sodium (COLACE) 100 MG capsule Take 200 mg by mouth 3 (three) times daily as needed for mild constipation.     [name] Kitchen EFFIENT 10 MG TABS tablet TAKE 1 TABLET BY MOUTH EVERY DAY 30 tablet 0  . GLUCAGON EMERGENCY 1 MG injection Inject 1 mg into the muscle daily as needed (low blood sugar).     [name] Kitchen HYDROmorphone (DILAUDID) 2 MG tablet Take 2 mg by mouth 3 (three) times daily as needed for severe pain.     . Insulin Human (INSULIN PUMP) 100 unit/ml SOLN Inject into the skin continuous. Humalog    . levothyroxine (SYNTHROID, LEVOTHROID) 100 MCG tablet Take 100 mcg by mouth daily before breakfast.    . loratadine (CLARITIN) 10 MG tablet Take 10 mg by mouth daily.    . metoCLOPramide (REGLAN) 5 MG tablet Take 5 mg by mouth 4 (four) times daily - after meals and at bedtime.    . metoprolol succinate (TOPROL-XL) 25 MG 24 hr tablet Take 0.5 tablets (12.5 mg total) by mouth daily. 15 tablet 12  . multivitamin (RENA-VIT) TABS tablet Take 1 tablet by mouth daily.    . nitroGLYCERIN (NITROSTAT) 0.4 MG SL tablet Place 0.4 mg under the tongue every 5 (five) minutes as needed for chest pain.     [name] Kitchen ondansetron (ZOFRAN) 4 MG tablet Take 1 tablet (4 mg total) by mouth every 8 (eight) hours as needed for nausea or vomiting. 6 tablet 0  . promethazine (PHENERGAN) 25 MG tablet Take 25 mg by mouth Every 6 hours as needed for nausea or vomiting. For nausea    . rOPINIRole (REQUIP) 2 MG tablet Take 2 mg by mouth daily with supper.     [name] Kitchen  sevelamer carbonate (RENVELA) 800 MG tablet Take 1,600 mg by mouth 3 (three) times daily with meals.     No current facility-administered medications on file prior to visit.    Allergies  Allergen Reactions  . Doxycycline     RASH  . Hydrocodone     RASH  . Morphine And Related Hives and Nausea Only  . Amoxicillin Other (See Comments)     Reaction unknown  . Ciprofloxacin Other (See Comments)    Reaction unknown  . Clindamycin/Lincomycin Other (See Comments)    Reaction unknown  . Codeine Other (See Comments)    Reaction unknown. But pt has tolerated hydrocodone in Vicodin and Norco in the past  . Levofloxacin Other (See Comments)    Reaction unknown  . Rocephin [Ceftriaxone] Itching  . Vasotec Cough  . Contrast Media [Iodinated Diagnostic Agents] Rash  . Oxycodone Rash  . Plavix [Clopidogrel Bisulfate] Rash    REVIEW OF SYSTEMS:  (Positives checked otherwise negative)  CARDIOVASCULAR:  []  chest pain, []  chest pressure, []  palpitations, []  shortness of breath when laying flat, []  shortness of breath with exertion,  []  pain in feet when walking, []  pain in feet when laying flat, []  history of blood clot in veins (DVT), []  history of phlebitis, []  swelling in legs, []  varicose veins  PULMONARY:  []  productive cough, []  asthma, []  wheezing  NEUROLOGIC:  []  weakness in arms or legs, []  numbness in arms or legs, []  difficulty speaking or slurred speech, []  temporary loss of vision in one eye, []  dizziness  HEMATOLOGIC:  []  bleeding problems, []  problems with blood clotting too easily  MUSCULOSKEL:  []  joint pain, []  joint swelling  GASTROINTEST:  []  vomiting blood, []  blood in stool     GENITOURINARY:  []  burning with urination, []  blood in urine  PSYCHIATRIC:  []  history of major depression  INTEGUMENTARY:  []  rashes, [x]  ulcers  CONSTITUTIONAL:  []  fever, []  chills  Physical Examination  Filed Vitals:   11/06/14 1337  BP: 142/56  Pulse: 90  Height: 5\' 8"  (1.727 m)  Weight: 291 lb 3.2 oz (132.087 kg)  SpO2: 96%   Body mass index is 44.29 kg/(m^2).  General: A&O x 3, WD morbidly obese male   Head: [place]/AT  Neck: Supple   Pulmonary: Sym exp, good air movt, CTAB, no rales, rhonchi, & wheezing  Cardiac: RRR, Nl S1, S2, no Murmurs, rubs or gallops  Vascular: weakly palpable right radial pulse.  Right upper arm fistula with aneurysmal changes proximally with 1 x 1cm dry ulceration. . Fistula is pulsatile on palpation. Collateral veins seen in upper right arm. Mild swelling of entire right arm. 2+ left radial pulse. Previous left lower arm loop graft in place.   Musculoskeletal: Left BKA, wearing prosthesis. Right leg wrapped with ACE wrap.   Neurologic: No focal deficits.   Psychiatric: Judgment intact, Mood & affect appropriate for pt's clinical situation  Medical Decision Making  Daddy Disotell is a 49 y.o. male with ESRD on HD (MWF) who presents with aneurysmal changes and ulceration of right upper arm fistula. He denies bleeding complications with dialysis. This ulceration has been present for two months and has been stable. He has a known right subclavian stenosis that is evident by pulsatile thrill and collateral veins in his right upper arm. He has not undergone intervention for this but has seen multiple providers in the past. Recommend plication of right upper arm fistula due to concerns for future bleeding episodes  and continued degeneration the fistula. Also discussed possibility of new access but he reported no issues with his current access. He is currently on effiant s/p PCI of RCA with DES x 1 in December 2015 by Dr. [name]. Dr. Oneida Alar discussed with Dr. [name] who advised discontinuing effiant for 10 days prior to surgery. Will contact patient to schedule surgery on a non-dialysis day in the upcoming weeks.   Virgina Jock, PA-C [facility] Office: [phone] Pager: [phone]  11/06/2014, 3:02 PM  This patient was seen in conjunction with Dr. Oneida Alar.  Agree with above.  Will schedule for fistula revision after off Effient 10 days  Ruta Hinds, MD [facility] [place]: [phone] Pager: [phone]

## 2014-11-07 ENCOUNTER — Other Ambulatory Visit: Payer: Self-pay | Admitting: *Deleted

## 2014-11-07 ENCOUNTER — Telehealth: Payer: Self-pay | Admitting: Cardiovascular Disease

## 2014-11-07 ENCOUNTER — Telehealth: Payer: Self-pay | Admitting: *Deleted

## 2014-11-07 NOTE — Telephone Encounter (Signed)
Called Dr. Camillia Herter office re; upcoming surgery on 11-20-14. Pt to stop Effient on 11-10-14 per Dr. Oneida Alar. Dr. Scot Dock to do plication/revision of right upper arm AVF on 11-20-14.

## 2014-11-07 NOTE — Telephone Encounter (Signed)
Request for surgical clearance:  1. What type of surgery is being performed? Revision of Right arm Fistula  2. When is this surgery scheduled? 11/20/14  3. Are there any medications that need to be held prior to surgery and how long? Stop Effient on 11/10/14  4. Name of physician performing surgery? Dr. Scot Dock  5. What is your office phone and fax number? Fax [phone] 6.

## 2014-11-07 NOTE — Telephone Encounter (Signed)
Routed to [employer]

## 2014-11-10 NOTE — Telephone Encounter (Signed)
Spoke with Dale [name] and they already have instructions to stop Effient.  They were just calling to let us know when pt's surgery was scheduled.

## 2014-11-19 ENCOUNTER — Encounter (HOSPITAL_COMMUNITY): Payer: Self-pay | Admitting: *Deleted

## 2014-11-19 MED ORDER — SODIUM CHLORIDE 0.9 % IV SOLN
INTRAVENOUS | Status: DC
  Administered 2014-11-20 (×2): via INTRAVENOUS

## 2014-11-19 MED ORDER — VANCOMYCIN HCL 10 G IV SOLR
1500.0000 mg | INTRAVENOUS | Status: AC
  Administered 2014-11-20: 1500 mg via INTRAVENOUS
  Filled 2014-11-19 (×2): qty 1500

## 2014-11-19 NOTE — Anesthesia Preprocedure Evaluation (Addendum)
Anesthesia Evaluation  Patient identified by MRN, date of birth, ID band Patient awake    Reviewed: Allergy & Precautions, NPO status , Patient's Chart, lab work & pertinent test results, reviewed documented beta blocker date and time   History of Anesthesia Complications (+) PONV and DIFFICULT AIRWAYHistory of anesthetic complications: Has been intubated with MAC 4 without Glide and also with GLIDE 7.5 ETT.  Airway Mallampati: III   Neck ROM: Full    Dental  (+) Dental Advisory Given, Teeth Intact   Pulmonary sleep apnea ,  breath sounds clear to auscultation        Cardiovascular hypertension, Pt. on medications + angina + CAD Rhythm:Regular  DES RCA 03/2014, ECHO 08/2014 60%, EKG long QT   Neuro/Psych Anxiety    GI/Hepatic Neg liver ROS, GERD-  Medicated,  Endo/Other  diabetes, Type 2, Insulin DependentHypothyroidism Morbid obesity  Renal/GU DialysisRenal diseaseSP transplant, now back on dialysis       Musculoskeletal   Abdominal (+) + obese,   Peds  Hematology  (+) anemia , 9/29   Anesthesia Other Findings Multiple drug allergy reported, not using anything for his OSA, Would plan on Glide Scope  Reproductive/Obstetrics                          Anesthesia Physical Anesthesia Plan  ASA: IV  Anesthesia Plan: General   Post-op Pain Management:    Induction: Intravenous  Airway Management Planned: Oral ETT and Video Laryngoscope Planned  Additional Equipment:   Intra-op Plan:   Post-operative Plan:   Informed Consent: I have reviewed the patients History and Physical, chart, labs and discussed the procedure including the risks, benefits and alternatives for the proposed anesthesia with the patient or authorized representative who has indicated his/her understanding and acceptance.     Plan Discussed with:   Anesthesia Plan Comments: (Check am labs,GLIDE, verify he took his Asprin  this am, did not take Asprin, will give now.   )     Anesthesia Quick Evaluation

## 2014-11-19 NOTE — Progress Notes (Signed)
Anesthesia Chart Review: SAME DAY WORK-UP.  Patient is a 49 year old male scheduled for plication/revision of AVF RUE tomorrow by Dr. Scot Dock. He has an ulceration over a aneurysmal RUE AVF. Surgery was recommended due to concerns for future bleeding and continued degeneration of his AVF.   History includes ESRD s/p renal transplant '95 but now on HD (MWF), CAD s/p DES RCA 04/09/14, PAD s/p left BKA '10, DM1 with insulin pump, hypothyroidism, anemia, fibromyalgia, hypercholesterolemia, OSA (can't tolerate CPAP due to claustrophobia), GERD. Last year he was evaluated at [name] for consideration of right first rib resection and right subclavian venoplasty due to ongoing thoracic outlet syndrome and outflow stenosis of his right brachiocephalic fistula. However, venogram was performed in 10/2013 and his right SCV was occluded and unable to be revascularized (see Care Everywhere), DIFFICULT INTUBATION. BMI is consistent with morbid obesity. Nephrologist is Dr. Mercy Moore.   In regards to his anesthesia history, I reviewed [facility] records in [place] from 10/16/13 that indicate glidescope was used. At [facility] on 08/10/11, he underwent GETA using IV induction, cricoid pression, MAC 4, stylet to place 7.5 mm ETT.  Cardiologist is Dr. [name], last evaluation in 08/2014 during admission for chest pain felt related to hypotension while on hemodialysis. Troponins were negative. Imdur dose was decreased and echo repeated (see below).  According to [facility] records, Dr. Oneida Alar spoke with Dr. [name] who "advised discontinuing effiant for 10 days prior to surgery." Patient was under the impression that he should hold ASA too, but I verified with [facility] RN Zigmund Daniel that they do not hold ASA for this procedure and he was not instructed to do so by their office. I've asked her or RN Colletta Maryland to call and clarify this with him and instruct him to take an ASA dose today and tomorrow morning. He denied chest pain to his phone interviewing PAT  RN.  Meds listed currently include ASA 81 mg, Lipitor, Phoslo, Effient, Dilaudid, insulin pump, levothyroxine, Claritin, Reglan, Nitro, Phenergan, Requip, Renvela; however, med list is yet to be updated (message left by pharmacy).   09/18/14 Echo: Study Conclusions - Left ventricle: The cavity size was normal. Wall thickness was increased in a pattern of severe LVH. Systolic function was normal. The estimated ejection fraction was in the range of 55% to 60%. Wall motion was normal; there were no regional wall motion abnormalities. Doppler parameters are consistent with abnormal left ventricular relaxation (grade 1 diastolicdysfunction). - Mitral valve: Calcified annulus. - Left atrium: The atrium was mildly dilated.  03/19/14 Cardiac cath (done due to abnormal stress test):  Angiographic Findings: Left main: No obstructive disease.  Left Anterior Descending Artery: Large caliber vessel that courses to the apex. There is diffuse mild plaque in the proximal and mid vessel. There is a moderate caliber diagonal branch with mid to distal 80% stenosis.  Circumflex Artery: Large vessel with large obtuse marginal branch. Mild diffuse plaque.  Right Coronary Artery: Large caliber dominant vessel with 99% sub-total occlusion (functional CTO) with TIMI-1 flow noted beyond the stenosis. The distal vessel is also noted to have a 80% stenosis. The distal vessel fills from left to right collaterals.  Left Ventricular Angiogram: Deferred.  Impression: 1. Severe stenosis mid RCA, s/p balloon angioplasty only of the mid RCA. (unable to deliver stent) 2. Severe stenosis mid to distal diagonal-medical management of this vessel 3. Patent Circumflex and LAD Recommendations: I was unable to deliver a stent into the mid RCA despite aggressive guide support with a Guideliner secondary  to diffuse, severe calcification of the vessel. Will place on telemetry unit tonight and monitor closely. He will need rotablator  atherectomy of the mid RCA. This will be staged in 2-3 weeks. I will plan on discharge home in the am unless he has clinical instability. I will contact him to arrange a date and time for cath/PCI. Will continue ASA, Plavix, statin, beta blocker. (Dr. [name]). Intervention (04/09/14): Successful rotablator atherectomy and PTCA/DES mid RCA.  09/29/14 EKG: SR, prolonged QT interval.  09/30/14 CXR: Lower lung volumes with increased right infrahilar markings, likely atelectasis, and question of bronchial thickening.  He is for labs on arrival.   I have updated anesthesiologist Dr. Lissa Hoard regarding patient's history and that patient has been off ASA and Effient for the past nine days. I had already verified with [facility] that he was only to hold the Effient. Dr. Lissa Hoard would like to make sure patient takes his ASA today and tomorrow. I have already asked [facility] nurses to notify patient.  Further evaluation by his assigned anesthesiologist on the day of surgery to discuss the definitive anesthesia plan.   [facility] Short Stay Center/Anesthesiology Phone [phone] 11/19/2014 3:05 PM

## 2014-11-19 NOTE — Progress Notes (Signed)
Pt has a history of SOB and stated that he last experienced SOB on Tuesday when lying down. Pt denies chest pain but is under the care of Dr. Julianne Handler, cardiology. Pt stated that he was instructed by Dr. Oneida Alar to stop both Aspirin and Effient; "tomorrow will make 10 days." Pt stated that he knows what to do regarding insulin pump when instructed to be NPO after midnight. Pt stated , " I will keep my pump on and check my BS; I will adjust my insulin if needed, i have had this pump for 6 years."  Spoke with Sonia Baller, Diabetic Coordinator to make aware of pt status and comments. Spoke with Ebony Hail, [place], anesthesia to review pt history.

## 2014-11-19 NOTE — Progress Notes (Signed)
Instructed patient to arrive at 1100 for surgery to start now at 1330 pm.

## 2014-11-20 ENCOUNTER — Ambulatory Visit (HOSPITAL_COMMUNITY)
Admission: RE | Admit: 2014-11-20 | Discharge: 2014-11-20 | Disposition: A | Discharge status: Home / Self Care | Payer: Medicare Other | Source: Ambulatory Visit | Attending: Vascular Surgery | Admitting: Vascular Surgery

## 2014-11-20 ENCOUNTER — Encounter (HOSPITAL_COMMUNITY): Payer: Self-pay | Admitting: *Deleted

## 2014-11-20 ENCOUNTER — Ambulatory Visit (HOSPITAL_COMMUNITY): Payer: Medicare Other | Admitting: Vascular Surgery

## 2014-11-20 ENCOUNTER — Encounter (HOSPITAL_COMMUNITY)
Admission: RE | Disposition: A | Discharge status: Home / Self Care | Payer: Self-pay | Source: Ambulatory Visit | Attending: Vascular Surgery

## 2014-11-20 DIAGNOSIS — Z955 Presence of coronary angioplasty implant and graft: Secondary | ICD-10-CM | POA: Insufficient documentation

## 2014-11-20 DIAGNOSIS — Z7982 Long term (current) use of aspirin: Secondary | ICD-10-CM | POA: Diagnosis not present

## 2014-11-20 DIAGNOSIS — E1122 Type 2 diabetes mellitus with diabetic chronic kidney disease: Secondary | ICD-10-CM | POA: Insufficient documentation

## 2014-11-20 DIAGNOSIS — Z6841 Body Mass Index (BMI) 40.0 and over, adult: Secondary | ICD-10-CM | POA: Insufficient documentation

## 2014-11-20 DIAGNOSIS — I2510 Atherosclerotic heart disease of native coronary artery without angina pectoris: Secondary | ICD-10-CM | POA: Insufficient documentation

## 2014-11-20 DIAGNOSIS — N186 End stage renal disease: Secondary | ICD-10-CM | POA: Diagnosis not present

## 2014-11-20 DIAGNOSIS — E119 Type 2 diabetes mellitus without complications: Secondary | ICD-10-CM | POA: Diagnosis not present

## 2014-11-20 DIAGNOSIS — E785 Hyperlipidemia, unspecified: Secondary | ICD-10-CM | POA: Diagnosis not present

## 2014-11-20 DIAGNOSIS — T82898A Other specified complication of vascular prosthetic devices, implants and grafts, initial encounter: Secondary | ICD-10-CM | POA: Diagnosis not present

## 2014-11-20 DIAGNOSIS — Z9641 Presence of insulin pump (external) (internal): Secondary | ICD-10-CM | POA: Diagnosis not present

## 2014-11-20 DIAGNOSIS — Z794 Long term (current) use of insulin: Secondary | ICD-10-CM | POA: Diagnosis not present

## 2014-11-20 DIAGNOSIS — T8611 Kidney transplant rejection: Secondary | ICD-10-CM | POA: Insufficient documentation

## 2014-11-20 DIAGNOSIS — Z992 Dependence on renal dialysis: Secondary | ICD-10-CM | POA: Diagnosis not present

## 2014-11-20 DIAGNOSIS — I120 Hypertensive chronic kidney disease with stage 5 chronic kidney disease or end stage renal disease: Secondary | ICD-10-CM | POA: Diagnosis not present

## 2014-11-20 DIAGNOSIS — E039 Hypothyroidism, unspecified: Secondary | ICD-10-CM | POA: Insufficient documentation

## 2014-11-20 HISTORY — DX: Failed or difficult intubation, initial encounter: T88.4XXA

## 2014-11-20 HISTORY — DX: Nausea with vomiting, unspecified: R11.2

## 2014-11-20 HISTORY — DX: Other specified postprocedural states: Z98.890

## 2014-11-20 HISTORY — PX: REVISON OF ARTERIOVENOUS FISTULA: SHX6074

## 2014-11-20 LAB — PROTIME-INR
INR: 1.14 (ref 0.00–1.49)
Prothrombin Time: 14.8 seconds (ref 11.6–15.2)

## 2014-11-20 LAB — GLUCOSE, CAPILLARY
Glucose-Capillary: 180 mg/dL — ABNORMAL HIGH (ref 65–99)
Glucose-Capillary: 140 mg/dL — ABNORMAL HIGH (ref 65–99)

## 2014-11-20 LAB — APTT: aPTT: 36 seconds (ref 24–37)

## 2014-11-20 LAB — POCT I-STAT 4, (NA,K, GLUC, HGB,HCT)
Glucose, Bld: 196 mg/dL — ABNORMAL HIGH (ref 65–99)
HCT: 37 % — ABNORMAL LOW (ref 39.0–52.0)
Hemoglobin: 12.6 g/dL — ABNORMAL LOW (ref 13.0–17.0)
Potassium: 4.4 mmol/L (ref 3.5–5.1)
Sodium: 139 mmol/L (ref 135–145)

## 2014-11-20 SURGERY — REVISON OF ARTERIOVENOUS FISTULA
Anesthesia: General | Site: Arm Upper | Laterality: Right

## 2014-11-20 MED ORDER — HEPARIN SODIUM (PORCINE) 5000 UNIT/ML IJ SOLN
INTRAMUSCULAR | Status: DC | PRN
  Administered 2014-11-20: 15:00:00

## 2014-11-20 MED ORDER — MIDAZOLAM HCL 2 MG/2ML IJ SOLN
INTRAMUSCULAR | Status: AC
  Filled 2014-11-20: qty 2

## 2014-11-20 MED ORDER — SODIUM CHLORIDE 0.9 % IR SOLN
Status: DC | PRN
  Administered 2014-11-20: 1000 mL

## 2014-11-20 MED ORDER — FENTANYL CITRATE (PF) 100 MCG/2ML IJ SOLN
INTRAMUSCULAR | Status: DC | PRN
  Administered 2014-11-20 (×3): 50 ug via INTRAVENOUS

## 2014-11-20 MED ORDER — PROPOFOL 10 MG/ML IV BOLUS
INTRAVENOUS | Status: DC | PRN
  Administered 2014-11-20: 250 mg via INTRAVENOUS
  Administered 2014-11-20: 50 mg via INTRAVENOUS

## 2014-11-20 MED ORDER — PROTAMINE SULFATE 10 MG/ML IV SOLN
INTRAVENOUS | Status: DC | PRN
  Administered 2014-11-20: 50 mg via INTRAVENOUS

## 2014-11-20 MED ORDER — PROPOFOL 10 MG/ML IV BOLUS
INTRAVENOUS | Status: AC
  Filled 2014-11-20: qty 20

## 2014-11-20 MED ORDER — THROMBIN 20000 UNITS EX SOLR
CUTANEOUS | Status: AC
  Filled 2014-11-20: qty 20000

## 2014-11-20 MED ORDER — FENTANYL CITRATE (PF) 250 MCG/5ML IJ SOLN
INTRAMUSCULAR | Status: AC
  Filled 2014-11-20: qty 5

## 2014-11-20 MED ORDER — MIDAZOLAM HCL 5 MG/5ML IJ SOLN
INTRAMUSCULAR | Status: DC | PRN
  Administered 2014-11-20: 2 mg via INTRAVENOUS

## 2014-11-20 MED ORDER — CHLORHEXIDINE GLUCONATE CLOTH 2 % EX PADS: 6.0000 | MEDICATED_PAD | Freq: Once | CUTANEOUS | Status: DC

## 2014-11-20 MED ORDER — PHENYLEPHRINE HCL 10 MG/ML IJ SOLN
INTRAMUSCULAR | Status: DC | PRN
  Administered 2014-11-20 (×2): 120 ug via INTRAVENOUS

## 2014-11-20 MED ORDER — PROMETHAZINE HCL 25 MG/ML IJ SOLN: 6.2500 mg | INTRAMUSCULAR | Status: DC | PRN

## 2014-11-20 MED ORDER — MEPERIDINE HCL 25 MG/ML IJ SOLN: 6.2500 mg | INTRAMUSCULAR | Status: DC | PRN

## 2014-11-20 MED ORDER — SUCCINYLCHOLINE CHLORIDE 20 MG/ML IJ SOLN
INTRAMUSCULAR | Status: DC | PRN
  Administered 2014-11-20: 140 mg via INTRAVENOUS

## 2014-11-20 MED ORDER — ONDANSETRON HCL 4 MG/2ML IJ SOLN
INTRAMUSCULAR | Status: AC
  Filled 2014-11-20: qty 2

## 2014-11-20 MED ORDER — ASPIRIN 81 MG PO CHEW
81.0000 mg | CHEWABLE_TABLET | Freq: Once | ORAL | Status: AC
  Administered 2014-11-20: 81 mg via ORAL
  Filled 2014-11-20: qty 1

## 2014-11-20 MED ORDER — LIDOCAINE HCL (PF) 1 % IJ SOLN
INTRAMUSCULAR | Status: AC
  Filled 2014-11-20: qty 30

## 2014-11-20 MED ORDER — ARTIFICIAL TEARS OP OINT
TOPICAL_OINTMENT | OPHTHALMIC | Status: AC
  Filled 2014-11-20: qty 3.5

## 2014-11-20 MED ORDER — FENTANYL CITRATE (PF) 100 MCG/2ML IJ SOLN
INTRAMUSCULAR | Status: AC
  Filled 2014-11-20: qty 2

## 2014-11-20 MED ORDER — ASPIRIN 81 MG PO CHEW
CHEWABLE_TABLET | ORAL | Status: AC
  Administered 2014-11-20: 81 mg via ORAL
  Filled 2014-11-20: qty 1

## 2014-11-20 MED ORDER — HEPARIN SODIUM (PORCINE) 1000 UNIT/ML IJ SOLN
INTRAMUSCULAR | Status: DC | PRN
  Administered 2014-11-20: 10000 [IU] via INTRAVENOUS

## 2014-11-20 MED ORDER — HYDROMORPHONE HCL 2 MG PO TABS: 2.0000 mg | ORAL_TABLET | Freq: Three times a day (TID) | ORAL | Status: DC | PRN

## 2014-11-20 MED ORDER — FENTANYL CITRATE (PF) 100 MCG/2ML IJ SOLN
25.0000 ug | INTRAMUSCULAR | Status: DC | PRN
  Administered 2014-11-20 (×2): 25 ug via INTRAVENOUS
  Administered 2014-11-20: 50 ug via INTRAVENOUS

## 2014-11-20 SURGICAL SUPPLY — 30 items
CANISTER SUCTION 2500CC (MISCELLANEOUS) ×2 IMPLANT
CANNULA VESSEL 3MM 2 BLNT TIP (CANNULA) IMPLANT
CLIP TI MEDIUM 6 (CLIP) ×2 IMPLANT
CLIP TI WIDE RED SMALL 6 (CLIP) ×2 IMPLANT
COVER PROBE W GEL 5X96 (DRAPES) ×2 IMPLANT
CUFF TOURNIQUET SINGLE 18IN (TOURNIQUET CUFF) ×1 IMPLANT
ELECT REM PT RETURN 9FT ADLT (ELECTROSURGICAL) ×2
ELECTRODE REM PT RTRN 9FT ADLT (ELECTROSURGICAL) ×1 IMPLANT
GLOVE BIO SURGEON STRL SZ 6.5 (GLOVE) ×2 IMPLANT
GLOVE BIO SURGEON STRL SZ7.5 (GLOVE) ×2 IMPLANT
GLOVE BIOGEL PI IND STRL 6.5 (GLOVE) IMPLANT
GLOVE BIOGEL PI IND STRL 8 (GLOVE) ×1 IMPLANT
GLOVE BIOGEL PI INDICATOR 6.5 (GLOVE) ×2
GLOVE BIOGEL PI INDICATOR 8 (GLOVE) ×1
GOWN STRL REUS W/ TWL LRG LVL3 (GOWN DISPOSABLE) ×3 IMPLANT
GOWN STRL REUS W/TWL LRG LVL3 (GOWN DISPOSABLE) ×8
KIT BASIN OR (CUSTOM PROCEDURE TRAY) ×2 IMPLANT
KIT ROOM TURNOVER OR (KITS) ×2 IMPLANT
LIQUID BAND (GAUZE/BANDAGES/DRESSINGS) ×2 IMPLANT
NS IRRIG 1000ML POUR BTL (IV SOLUTION) ×2 IMPLANT
PACK CV ACCESS (CUSTOM PROCEDURE TRAY) ×2 IMPLANT
PAD ARMBOARD 7.5X6 YLW CONV (MISCELLANEOUS) ×4 IMPLANT
SPONGE SURGIFOAM ABS GEL 100 (HEMOSTASIS) IMPLANT
SUT PROLENE 5 0 C 1 24 (SUTURE) ×2 IMPLANT
SUT PROLENE 6 0 BV (SUTURE) ×2 IMPLANT
SUT VIC AB 3-0 SH 27 (SUTURE) ×2
SUT VIC AB 3-0 SH 27X BRD (SUTURE) ×1 IMPLANT
SUT VICRYL 4-0 PS2 18IN ABS (SUTURE) ×2 IMPLANT
UNDERPAD 30X30 INCONTINENT (UNDERPADS AND DIAPERS) ×2 IMPLANT
WATER STERILE IRR 1000ML POUR (IV SOLUTION) ×2 IMPLANT

## 2014-11-20 NOTE — Anesthesia Procedure Notes (Signed)
Procedure Name: Intubation Date/Time: 11/20/2014 2:18 PM Performed by: [name] Pre-anesthesia Checklist: Patient identified, Emergency Drugs available, Suction available, Patient being monitored and Timeout performed Patient Re-evaluated:Patient Re-evaluated prior to inductionOxygen Delivery Method: Circle system utilized Preoxygenation: Pre-oxygenation with 100% oxygen Intubation Type: IV induction and Rapid sequence Laryngoscope Size: Glidescope and 4 Grade View: Grade I Tube type: Oral Tube size: 7.5 mm Number of attempts: 1 Airway Equipment and Method: Stylet and Video-laryngoscopy Placement Confirmation: ETT inserted through vocal cords under direct vision,  positive ETCO2 and breath sounds checked- equal and bilateral Secured at: 21 cm Tube secured with: Tape Dental Injury: Teeth and Oropharynx as per pre-operative assessment

## 2014-11-20 NOTE — Transfer of Care (Signed)
Immediate Anesthesia Transfer of Care Note  Patient: Dale [name]  Procedure(s) Performed: Procedure(s): PLICATION / REVISION OF ARTERIOVENOUS FISTULA (Right)  Patient Location: PACU  Anesthesia Type:General  Level of Consciousness: awake, alert , oriented and patient cooperative  Airway & Oxygen Therapy: Patient Spontanous Breathing and Patient connected to nasal cannula oxygen  Post-op Assessment: Report given to RN, Post -op Vital signs reviewed and stable and Patient moving all extremities  Post vital signs: Reviewed and stable  Last Vitals:  Filed Vitals:   11/20/14 1137  BP: 152/64  Pulse: 84  Temp: 36.6 C  Resp: 18    Complications: No apparent anesthesia complications

## 2014-11-20 NOTE — Progress Notes (Signed)
Dr. Tresa Moore in to see pt, aware of insulin pump and that pt stopped aspirin and Effient 10 days ago, ordered to give 81mg  PO aspirin which was done.

## 2014-11-20 NOTE — Interval H&P Note (Signed)
History and Physical Interval Note:  11/20/2014 1:51 PM  Dale [name]  has presented today for surgery, with the diagnosis of Mechanical complication of AVF  0000000   Chronic Kidney Disease N18.6  The various methods of treatment have been discussed with the patient and family. After consideration of risks, benefits and other options for treatment, the patient has consented to  Procedure(s): PLICATION / REVISION OF ARTERIOVENOUS FISTULA (Right) as a surgical intervention .  [name] history has been reviewed, patient examined, no change in status, stable for surgery.  I have reviewed the patient's chart and labs.  Questions were answered to the patient's satisfaction.     [name]

## 2014-11-20 NOTE — H&P (View-Only) (Signed)
Referred by:  Fleet Contras, MD [place], [place] [address]  Reason for referral: Scab over right upper arm fistula.   History of Present Illness  Dale [name] is a 49 y.o. (12/22/1965) male who presents as an add-on. He was sent here by his nephrologist Dr. Mercy Moore due to concerns regarding a scab over his right brachiocephalic AV fistula (created 07/29/11 by Dr. Kellie Simmering). This scab has been present for two months and has been stable. He denies any bleeding episodes. He reports chronic swelling of his right arm. He dialyzes on Mondays, Wednesdays and Fridays. He reports no issues with dialysis and has had adequate flow rates. He has a known left subclavian stenosis. He denies any known central venous stenosis. He was previously seen at [facility] and scheduled for removal of right first rib for correction. This surgery did not happen however due to inability to stent the artery. He subsequently saw Dr. Augustin Coupe at [facility] who performed a fistulogram and referred the patient to [facility] for intervention. No appointment was made so the patient has not had any intervention performed.   He has a history of CAD s/p PTCA/DES x 1 to mid LAD and PTCA of distal RCA by Dr. [name]. He is currently on Effiant therapy.   Other access surgeries include: left forearm loop graft. Cannulation of this graft was discontinued due to significant swelling of his left arm after use. He also had a failed left radiocephalic fistula. He has not had any imaging of his left upper extremity to evaluate for central venous stenosis. He also had right thigh graft that was removed secondary to infection.   He had a renal transplant that was successful for twenty years and subsequently failed. He has a left below knee amputation and uses a prosthesis. He reports having a left toe amputation due to infection. He had an iatrogenic injury to his ankle ligament during his surgery.  Unfortunately reconstruction of his ankle was not  deemed possible due to his significant past medical history and he was recommended to have a below knee amputation. He also suffers from venous stasis ulcers and swelling and has gone to the wound care center in the past. He is now self treating with over the counter unna boots.   He has a past medical history of diabetes on an insulin pump. He also has hypertension, hyperlipidemia and morbid obesity.   Of note, he was recently hospitalized last month secondary to hypotension and hypoxia. He was discharged after three days. His ECHO showed mild diastolic heart failure. His CTA was negative for PE.    Past Medical History  Diagnosis Date  . Hypertension   . History of blood transfusion     "related to dialysis"  . Dyspnea     a. 07/23/11 Echo: EF 55-60%  . Anemia of chronic disease   . Cholecystitis     a. 08/27/2011  . Constipation   . Pericardial effusion     a.  Small by CT 08/22/11;  b.  Large by CT 08/27/11  . Inguinal lymphadenopathy     a. bilateral - s/p biopsy 07/2011  . Fibromyalgia   . Hx of echocardiogram     Echo (11/15):  Mild LVH, EF 60-65%, no RWMA, Gr 1 DD, MAC, mild LAE, normal RVF, mild RAE  . Hx of cardiovascular stress test     Lexiscan Myoview (11/15):  Inf, apical cap and apical lateral ischemia, EF 56%, inf HK, Moderate Risk  .  High cholesterol   . Pneumonia ~ 2007  . OSA (obstructive sleep apnea)     "I'm not doing the mask; claustrophobic"  . Claustrophobia   . Hypothyroidism   . Insulin dependent diabetes mellitus   . GERD (gastroesophageal reflux disease)   . Arthritis     "hands, right knee" (03/19/2014)  . ESRD (end stage renal disease)     a. 1995 s/p cadaveric transplant w/ susbequent failure after 18 yrs;  b.Dialysis initiated 07/2011  . CAD (coronary artery disease)     a. LHC (11/15):  mid to dist Dx 80%, mid RCA 99% (functional CTO), dist RCA 80% >> PCI: balloon angioplasty to mid RCA (could not deliver stent) - plan staged PCI with HSRA of RCA; Dx  to be tx medically     Past Surgical History  Procedure Laterality Date  . Kidney transplant  04/25/1993  . Cataract extraction w/ intraocular lens  implant, bilateral Bilateral 1990's  . Arteriovenous graft placement Left 1993?    forearm  . Arteriovenous graft placement Right 1993?    leg  . Toe amputation Bilateral after 2005  . Below knee leg amputation Left 2010  . Carpal tunnel release Bilateral after 2005  . Shoulder arthroscopy Left ~ 2006    frozen  . Av fistula placement  07/29/2011    Procedure: ARTERIOVENOUS (AV) FISTULA CREATION;  Surgeon: Mal Misty, MD;  Location: [place];  Service: Vascular;  Laterality: Right;  Brachial cephalic  . Insertion of dialysis catheter  08/01/2011    Procedure: INSERTION OF DIALYSIS CATHETER;  Surgeon: Rosetta Posner, MD;  Location: [place];  Service: Vascular;  Laterality: Right;  insertion of dialysis catheter on right internal jugular vein  . Lymph node biopsy  08/10/2011    Procedure: LYMPH NODE BIOPSY;  Surgeon: Merrie Roof, MD;  Location: [place];  Service: General;  Laterality: Left;  left groin lymph biopsy  . Colonoscopy  08/29/2011    Procedure: COLONOSCOPY;  Surgeon: Jerene Bears, MD;  Location: [place];  Service: Gastroenterology;  Laterality: N/A;  . Appendectomy  ~ 2006  . Hernia repair    . Umbilical hernia repair  1990's  . Cardiac catheterization  03/19/2014  . Av fistula placement Left 1993?    "attempted one in my wrist; didn't take"  . Neuroplasty / transposition ulnar nerve at elbow Left after 2005  . Venogram Right 07/28/2011    Procedure: VENOGRAM;  Surgeon: Conrad Alderpoint, MD;  Location: [facility] C[facility] LAB;  Service: Cardiovascular;  Laterality: Right;  . Left heart catheterization with coronary angiogram N/A 03/19/2014    Procedure: LEFT HEART CATHETERIZATION WITH CORONARY ANGIOGRAM;  Surgeon: Burnell Blanks, MD;  Location: [facility] CATH LAB;  Service: Cardiovascular;  Laterality: N/A;  . Cardiac catheterization  03/19/2014     Procedure: CORONARY BALLOON ANGIOPLASTY;  Surgeon: Burnell Blanks, MD;  Location: [facility] CATH LAB;  Service: Cardiovascular;;  . Coronary angioplasty with stent placement  04/09/2014        ptca/des mid lad   . Angioplasty  04/09/2014    RCA   . Femoral artery repair  04/09/2014    ANGIOSEAL  . Eye surgery    . Percutaneous coronary rotoblator intervention (pci-r) N/A 04/09/2014    Procedure: PERCUTANEOUS CORONARY ROTOBLATOR INTERVENTION (PCI-R);  Surgeon: Burnell Blanks, MD;  Location: [facility] CATH LAB;  Service: Cardiovascular;  Laterality: N/A;    History   Social History  . Marital Status: Married  Spouse Name: N/A  . Number of Children: N/A  . Years of Education: N/A   Occupational History  . Disabled    Social History Main Topics  . Smoking status: Never Smoker   . Smokeless tobacco: Never Used  . Alcohol Use: Yes     Comment: 03/19/2014 "might have a drink 2-3 times/year"  . Drug Use: No  . Sexual Activity: Not Currently   Other Topics Concern  . Not on file   Social History Narrative   Lives @ home with his wife in [place].     Family History  Problem Relation Age of Onset  . Malignant hyperthermia Neg Hx   . Heart attack Neg Hx   . Stroke Neg Hx   . Colon polyps Father   . Diabetes Father   . Cancer Father   . Hypertension Father   . Breast cancer Maternal Aunt   . Cancer Maternal Aunt     Breast and Bone  . Hypertension Mother   . Diabetes Mother   . Hyperlipidemia Mother      Current Outpatient Prescriptions on File Prior to Visit  Medication Sig Dispense Refill  . allopurinol (ZYLOPRIM) 100 MG tablet Take 200 mg by mouth daily.     [name] Kitchen aspirin 81 MG tablet Take 81 mg by mouth every evening.     [name] Kitchen atorvastatin (LIPITOR) 10 MG tablet Take 10 mg by mouth every evening.     . calcium acetate (PHOSLO) 667 MG capsule Take 667-1,334 mg by mouth See admin instructions. Take 1,334 mg (2 capsules) with meals and 667 (1 capsule) with snacks      . diphenhydrAMINE (BENADRYL) 25 mg capsule Take 1-2 capsules (25-50 mg total) by mouth every 6 (six) hours as needed for itching. 30 capsule 0  . docusate sodium (COLACE) 100 MG capsule Take 200 mg by mouth 3 (three) times daily as needed for mild constipation.     [name] Kitchen EFFIENT 10 MG TABS tablet TAKE 1 TABLET BY MOUTH EVERY DAY 30 tablet 0  . GLUCAGON EMERGENCY 1 MG injection Inject 1 mg into the muscle daily as needed (low blood sugar).     [name] Kitchen HYDROmorphone (DILAUDID) 2 MG tablet Take 2 mg by mouth 3 (three) times daily as needed for severe pain.     . Insulin Human (INSULIN PUMP) 100 unit/ml SOLN Inject into the skin continuous. Humalog    . levothyroxine (SYNTHROID, LEVOTHROID) 100 MCG tablet Take 100 mcg by mouth daily before breakfast.    . loratadine (CLARITIN) 10 MG tablet Take 10 mg by mouth daily.    . metoCLOPramide (REGLAN) 5 MG tablet Take 5 mg by mouth 4 (four) times daily - after meals and at bedtime.    . metoprolol succinate (TOPROL-XL) 25 MG 24 hr tablet Take 0.5 tablets (12.5 mg total) by mouth daily. 15 tablet 12  . multivitamin (RENA-VIT) TABS tablet Take 1 tablet by mouth daily.    . nitroGLYCERIN (NITROSTAT) 0.4 MG SL tablet Place 0.4 mg under the tongue every 5 (five) minutes as needed for chest pain.     [name] Kitchen ondansetron (ZOFRAN) 4 MG tablet Take 1 tablet (4 mg total) by mouth every 8 (eight) hours as needed for nausea or vomiting. 6 tablet 0  . promethazine (PHENERGAN) 25 MG tablet Take 25 mg by mouth Every 6 hours as needed for nausea or vomiting. For nausea    . rOPINIRole (REQUIP) 2 MG tablet Take 2 mg by mouth daily with supper.     [name] Kitchen  sevelamer carbonate (RENVELA) 800 MG tablet Take 1,600 mg by mouth 3 (three) times daily with meals.     No current facility-administered medications on file prior to visit.    Allergies  Allergen Reactions  . Doxycycline     RASH  . Hydrocodone     RASH  . Morphine And Related Hives and Nausea Only  . Amoxicillin Other (See Comments)     Reaction unknown  . Ciprofloxacin Other (See Comments)    Reaction unknown  . Clindamycin/Lincomycin Other (See Comments)    Reaction unknown  . Codeine Other (See Comments)    Reaction unknown. But pt has tolerated hydrocodone in Vicodin and Norco in the past  . Levofloxacin Other (See Comments)    Reaction unknown  . Rocephin [Ceftriaxone] Itching  . Vasotec Cough  . Contrast Media [Iodinated Diagnostic Agents] Rash  . Oxycodone Rash  . Plavix [Clopidogrel Bisulfate] Rash    REVIEW OF SYSTEMS:  (Positives checked otherwise negative)  CARDIOVASCULAR:  []  chest pain, []  chest pressure, []  palpitations, []  shortness of breath when laying flat, []  shortness of breath with exertion,  []  pain in feet when walking, []  pain in feet when laying flat, []  history of blood clot in veins (DVT), []  history of phlebitis, []  swelling in legs, []  varicose veins  PULMONARY:  []  productive cough, []  asthma, []  wheezing  NEUROLOGIC:  []  weakness in arms or legs, []  numbness in arms or legs, []  difficulty speaking or slurred speech, []  temporary loss of vision in one eye, []  dizziness  HEMATOLOGIC:  []  bleeding problems, []  problems with blood clotting too easily  MUSCULOSKEL:  []  joint pain, []  joint swelling  GASTROINTEST:  []  vomiting blood, []  blood in stool     GENITOURINARY:  []  burning with urination, []  blood in urine  PSYCHIATRIC:  []  history of major depression  INTEGUMENTARY:  []  rashes, [x]  ulcers  CONSTITUTIONAL:  []  fever, []  chills  Physical Examination  Filed Vitals:   11/06/14 1337  BP: 142/56  Pulse: 90  Height: 5\' 8"  (1.727 m)  Weight: 291 lb 3.2 oz (132.087 kg)  SpO2: 96%   Body mass index is 44.29 kg/(m^2).  General: A&O x 3, WD morbidly obese male   Head: [place]/AT  Neck: Supple   Pulmonary: Sym exp, good air movt, CTAB, no rales, rhonchi, & wheezing  Cardiac: RRR, Nl S1, S2, no Murmurs, rubs or gallops  Vascular: weakly palpable right radial pulse.  Right upper arm fistula with aneurysmal changes proximally with 1 x 1cm dry ulceration. . Fistula is pulsatile on palpation. Collateral veins seen in upper right arm. Mild swelling of entire right arm. 2+ left radial pulse. Previous left lower arm loop graft in place.   Musculoskeletal: Left BKA, wearing prosthesis. Right leg wrapped with ACE wrap.   Neurologic: No focal deficits.   Psychiatric: Judgment intact, Mood & affect appropriate for pt's clinical situation  Medical Decision Making  Dale [name] is a 49 y.o. male with ESRD on HD (MWF) who presents with aneurysmal changes and ulceration of right upper arm fistula. He denies bleeding complications with dialysis. This ulceration has been present for two months and has been stable. He has a known right subclavian stenosis that is evident by pulsatile thrill and collateral veins in his right upper arm. He has not undergone intervention for this but has seen multiple providers in the past. Recommend plication of right upper arm fistula due to concerns for future bleeding episodes  and continued degeneration the fistula. Also discussed possibility of new access but he reported no issues with his current access. He is currently on effiant s/p PCI of RCA with DES x 1 in December 2015 by Dr. [name]. Dr. Oneida Alar discussed with Dr. [name] who advised discontinuing effiant for 10 days prior to surgery. Will contact patient to schedule surgery on a non-dialysis day in the upcoming weeks.   Virgina Jock, PA-C [facility] Office: [phone] Pager: [phone]  11/06/2014, 3:02 PM  This patient was seen in conjunction with Dr. Oneida Alar.  Agree with above.  Will schedule for fistula revision after off Effient 10 days  Ruta Hinds, MD [facility] [place]: [phone] Pager: [phone]

## 2014-11-20 NOTE — Discharge Instructions (Signed)
° ° °  11/20/2014 Dale [name] [id number] Sep 23, 1965  Surgeon(s): Angelia Mould, MD  Procedure(s): PLICATION / REVISION OF ARTERIOVENOUS FISTULA  x May stick graft on designated area only: stick away from incision

## 2014-11-20 NOTE — Op Note (Signed)
    NAME: Dale [name]   MRN: [id number] DOB: Oct 10, 1965    DATE OF OPERATION: 11/20/2014  PREOP DIAGNOSIS: eschars overlying right brachiocephalic AV fistula  POSTOP DIAGNOSIS: same  PROCEDURE: revision of right brachiocephalic AV fistula and plication of aneurysm  SURGEON: Judeth Cornfield. Scot Dock, MD, FACS  ASSIST: Silva Bandy, [facility]  ANESTHESIA: Gen.   EBL: minimal  INDICATIONS: Dale [name] is a 49 y.o. male who has a right brachiocephalic AV fistula which is somewhat degenerative. Patient has an aneurysm with 2 overlying eschars and he was scheduled for revision of the fistula with plication of the aneurysm.  FINDINGS: There was a large pseudoaneurysm associated with the aneurysmal area. I removed a large amount of laminated clot and plicated the aneurysm. The eschars were excised.  TECHNIQUE: The patient was taken to the operating room and received a general anesthetic. The right upper extremity was prepped and draped in the usual sterile fashion. An elliptical incision was made encompassing the 2 eschars and the dissection carried down to the aneurysm which was dissected free essentially circumferentially. However the patient appeared to have a large pseudoaneurysm associated with this aneurysmal segment and it was difficult to fully determine the anatomy given all the scar tissue. I therefore elected to open the aneurysm using tourniquet control. The patient was heparinized. A tourniquet was placed on the upper arm. The arm was exsanguinated with an Esmarch bandage and tourniquet inflated to 250 mmHg. Under tourniquet control, the aneurysm was opened on the lateral wall in order to preserve the anterior wall so that it could later be cannulated. It was a large amount of laminated thrombus which was evacuated. I then excised an ellipse of the aneurysm to plicate this somewhat. The venotomy was closed with running 50 proline suture. The tourniquet was then released and then there was good  flow in the fistula which was pulsatile as it was before. Hemostasis was obtained in the wound and heparin was partially reversed with protamine. The wound was then closed the deeper 3-0 Vicryl and the skin closed with 4-0 Vicryl. Dermabond was applied. The patient tolerated the procedure well and transferred to the recovery room in stable condition. All needle and sponge counts were correct.  [name], MD, FACS [facility]  DATE OF DICTATION:   11/20/2014

## 2014-11-21 ENCOUNTER — Encounter (HOSPITAL_COMMUNITY): Payer: Self-pay | Admitting: Vascular Surgery

## 2014-11-21 ENCOUNTER — Telehealth: Payer: Self-pay | Admitting: Vascular Surgery

## 2014-11-21 NOTE — Telephone Encounter (Signed)
LM for pt re appt with pts wife, dpm

## 2014-11-21 NOTE — Telephone Encounter (Signed)
-----   Message from Mena Goes, RN sent at 11/21/2014  9:22 AM EDT ----- Regarding: Schedule   ----- Message -----    From: Alvia Grove, PA-C    Sent: 11/20/2014   3:50 PM      To: [facility] Charge Pool  S/p plication of left arm pseudoaneurysm 11/20/14  F/u with Dr. Scot Dock in 4 weeks  Thanks Maudie Mercury

## 2014-11-21 NOTE — Anesthesia Postprocedure Evaluation (Signed)
Anesthesia Post Note  Patient: Dale [name]  Procedure(s) Performed: Procedure(s) (LRB): PLICATION / REVISION OF ARTERIOVENOUS FISTULA (Right)  Anesthesia type: General  Patient location: PACU  Post pain: Pain level controlled  Post assessment: Post-op Vital signs reviewed  Last Vitals: BP 132/52 mmHg  Pulse 85  Temp(Src) 36.7 C (Oral)  Resp 16  Ht 5\' 8"  (1.727 m)  Wt 291 lb 3.2 oz (132.087 kg)  BMI 44.29 kg/m2  SpO2 95%  Post vital signs: Reviewed  Level of consciousness: sedated  Complications: No apparent anesthesia complications

## 2014-11-26 NOTE — Progress Notes (Signed)
Cardiology Office Note   Date:  11/27/2014   ID:  Dale [name], DOB [birth year], MRN [id number]  PCP:  Windy Kalata, MD  Cardiologist:  Dr. Lauree Chandler   Electrophysiologist:  n/a  Chief Complaint  Patient presents with  . Coronary Artery Disease     History of Present Illness: Dale [name] is a 49 y.o. male with a hx of diabetes, HTN, sleep apnea, ESRD, fibromyalgia, chronic anemia, status post left BKA. He underwent renal transplant and had been off of dialysis. He was seen by Dr. Lauree Chandler in 05/2012 in the hospital for chest pain. Diagnostic options were limited as cardiac catheterization would have been detrimental to his transplanted kidney. Therefore, stress testing was not thought to be helpful. Medical therapy was recommended. Further ischemic evaluation could be pursued if he had recurrent symptoms.  He was then admitted 06/2012 with severe septic shock secondary to Salmonella bacteremia. He developed worsening renal function and ultimately yhemodialysis was resumed.  He returned in 01/2014 with complaints of dyspnea with exertion. He denied chest discomfort. He had no improvement with reduced dry weight. Echo demonstrated normal LVF and mild diastolic dysfunction. Myoview demonstrated inferior, apical and apical lateral ischemia (mod risk). LHC demonstrated severe RCA stenosis that was treated with POBA. It was felt he should be brought back in 2-3 weeks for rotablator atherectomy of the RCA. There was severe disease in the Dx that would be treated medically. Patient had significant IV dye allergic reaction post PCI. He ultimately underwent PCI with rotablator atherectomy and DES to the mid RCA. Post PCI, he did have a left groin AV fistula. Discomfort eventually resolved. Last seen by Dr. [name] 04/2014.  Admitted 5/16 with chest pain. Cardiac markers remained normal. Echocardiogram demonstrated normal EF with normal wall motion. Admitted 6/16  with hypoxia and hypotension around the time of dialysis. Medicines were adjusted and he was diuresed.  Here for routine FU.  Returns with his wife.  Denies chest pain. He has difficulty taking a deep breath when he lays down at times.  Has been getting to his dry weight.  No RLE edema.  No syncope.  BP running higher now after dialysis.  He has not had any further drops in BP like before he was admitted in 6/16.   Studies/Reports Reviewed Today:  Echo 09/21/14 Severe LVH, EF 55-60%, normal wall motion, grade 1 diastolic dysfunction, MAC, mild LAE  PCI 04/09/14 1. Unstable angina secondary to severe stenosis mid RCA 2. Successful rotablator atherectomy/PTCA/DES mid RCA   Past Medical History  Diagnosis Date  . Hypertension   . Anemia of chronic disease   . Cholecystitis     a. 08/27/2011  . Constipation   . Pericardial effusion     a.  Small by CT 08/22/11;  b.  Large by CT 08/27/11  . Inguinal lymphadenopathy     a. bilateral - s/p biopsy 07/2011  . Fibromyalgia   . Chronic diastolic CHF (congestive heart failure)     a. Echo 3/13:  EF 55-60%;  b. Echo (11/15):  Mild LVH, EF 60-65%, no RWMA, Gr 1 DD, MAC, mild LAE, normal RVF, mild RAE;  c.  Echo 5/16:  severe LVH, EF 55-60%, no RWMA, Gr 1 DD, MAC, mild LAE  . HLD (hyperlipidemia)   . Pneumonia ~ 2007  . OSA (obstructive sleep apnea)     intol of CPAP mask  . Claustrophobia   . Hypothyroidism   . Insulin dependent diabetes  mellitus   . GERD (gastroesophageal reflux disease)   . Arthritis     "hands, right knee" (03/19/2014)  . ESRD (end stage renal disease)     a. 1995 s/p cadaveric transplant w/ susbequent failure after 18 yrs;  b.Dialysis initiated 07/2011  . CAD (coronary artery disease)     a. Lexiscan Myoview (11/15):  Inf, apical cap and apical lateral ischemia, EF 56%, inf HK, Moderate Risk;  b. LHC (11/15):  mid to dist Dx 80%, mid RCA 99% (functional CTO), dist RCA 80% >> PCI: balloon angioplasty to mid RCA (could not  deliver stent) - plan staged PCI with HSRA of RCA; Dx to be tx medically >> PCI: Rotoblator atherectomy/DES to [facility]    Past Surgical History  Procedure Laterality Date  . Kidney transplant  04/25/1993  . Cataract extraction w/ intraocular lens  implant, bilateral Bilateral 1990's  . Arteriovenous graft placement Left 1993?    forearm  . Arteriovenous graft placement Right 1993?    leg  . Toe amputation Bilateral after 2005  . Below knee leg amputation Left 2010  . Carpal tunnel release Bilateral after 2005  . Shoulder arthroscopy Left ~ 2006    frozen  . Av fistula placement  07/29/2011    Procedure: ARTERIOVENOUS (AV) FISTULA CREATION;  Surgeon: Mal Misty, MD;  Location: [place];  Service: Vascular;  Laterality: Right;  Brachial cephalic  . Insertion of dialysis catheter  08/01/2011    Procedure: INSERTION OF DIALYSIS CATHETER;  Surgeon: Rosetta Posner, MD;  Location: [place];  Service: Vascular;  Laterality: Right;  insertion of dialysis catheter on right internal jugular vein  . Lymph node biopsy  08/10/2011    Procedure: LYMPH NODE BIOPSY;  Surgeon: Merrie Roof, MD;  Location: [place];  Service: General;  Laterality: Left;  left groin lymph biopsy  . Colonoscopy  08/29/2011    Procedure: COLONOSCOPY;  Surgeon: Jerene Bears, MD;  Location: [place];  Service: Gastroenterology;  Laterality: N/A;  . Appendectomy  ~ 2006  . Hernia repair    . Umbilical hernia repair  1990's  . Cardiac catheterization  03/19/2014  . Av fistula placement Left 1993?    "attempted one in my wrist; didn't take"  . Neuroplasty / transposition ulnar nerve at elbow Left after 2005  . Venogram Right 07/28/2011    Procedure: VENOGRAM;  Surgeon: Conrad Plaquemine, MD;  Location: [facility] CATH LAB;  Service: Cardiovascular;  Laterality: Right;  . Left heart catheterization with coronary angiogram N/A 03/19/2014    Procedure: LEFT HEART CATHETERIZATION WITH CORONARY ANGIOGRAM;  Surgeon: Burnell Blanks, MD;  Location: [facility]  CATH LAB;  Service: Cardiovascular;  Laterality: N/A;  . Cardiac catheterization  03/19/2014    Procedure: CORONARY BALLOON ANGIOPLASTY;  Surgeon: Burnell Blanks, MD;  Location: [facility] CATH LAB;  Service: Cardiovascular;;  . Coronary angioplasty with stent placement  04/09/2014        ptca/des mid lad   . Angioplasty  04/09/2014    RCA   . Femoral artery repair  04/09/2014    ANGIOSEAL  . Eye surgery    . Percutaneous coronary rotoblator intervention (pci-r) N/A 04/09/2014    Procedure: PERCUTANEOUS CORONARY ROTOBLATOR INTERVENTION (PCI-R);  Surgeon: Burnell Blanks, MD;  Location: [facility] CATH LAB;  Service: Cardiovascular;  Laterality: N/A;  . Revison of arteriovenous fistula Right [id number]    Procedure: PLICATION / REVISION OF ARTERIOVENOUS FISTULA;  Surgeon: Angelia Mould, MD;  Location: [facility]  OR;  Service: Vascular;  Laterality: Right;     Current Outpatient Prescriptions  Medication Sig Dispense Refill  . allopurinol (ZYLOPRIM) 100 MG tablet Take 200 mg by mouth daily.     [name] Kitchen aspirin 81 MG tablet Take 81 mg by mouth every evening.     [name] Kitchen atorvastatin (LIPITOR) 10 MG tablet Take 10 mg by mouth every evening.     . calcium acetate (PHOSLO) 667 MG capsule Take 667-1,334 mg by mouth See admin instructions. Take 1,334 mg (2 capsules) with meals and 667 (1 capsule) with snacks    . diphenhydrAMINE (BENADRYL) 25 mg capsule Take 1-2 capsules (25-50 mg total) by mouth every 6 (six) hours as needed for itching. 30 capsule 0  . docusate sodium (COLACE) 100 MG capsule Take 200 mg by mouth 3 (three) times daily.     [name] Kitchen EFFIENT 10 MG TABS tablet TAKE 1 TABLET BY MOUTH EVERY DAY 30 tablet 0  . furosemide (LASIX) 80 MG tablet Take 80 mg by mouth 2 (two) times daily.   98  . GLUCAGON EMERGENCY 1 MG injection Inject 1 mg into the muscle daily as needed (low blood sugar).     . hydrocortisone 2.5 % cream APPLY 2-3 TIMES DAILY TO AFFECTED AREA(S).  1  . HYDROmorphone (DILAUDID) 2 MG tablet  Take 1 tablet (2 mg total) by mouth 3 (three) times daily as needed for severe pain. 20 tablet 0  . Insulin Human (INSULIN PUMP) 100 unit/ml SOLN Inject into the skin continuous. Humalog    . levothyroxine (SYNTHROID, LEVOTHROID) 100 MCG tablet Take 100 mcg by mouth daily before breakfast.    . loratadine (CLARITIN) 10 MG tablet Take 10 mg by mouth daily.    . metoCLOPramide (REGLAN) 5 MG tablet Take 5 mg by mouth 4 (four) times daily - after meals and at bedtime.    . multivitamin (RENA-VIT) TABS tablet Take 1 tablet by mouth daily.    . nitroGLYCERIN (NITROSTAT) 0.4 MG SL tablet Place 0.4 mg under the tongue every 5 (five) minutes as needed for chest pain.     . promethazine (PHENERGAN) 25 MG tablet Take 1 tablet (25 mg total) by mouth every 6 (six) hours as needed for nausea or vomiting. For nausea 30 tablet 0  . ranitidine (ZANTAC) 150 MG tablet Take 150 mg by mouth 2 (two) times daily.  4  . rOPINIRole (REQUIP) 2 MG tablet Take 2 mg by mouth daily with supper.     . sevelamer carbonate (RENVELA) 800 MG tablet Take 1,600 mg by mouth 3 (three) times daily with meals.    . metoprolol succinate (TOPROL-XL) 25 MG 24 hr tablet Take 0.5 tablets (12.5 mg total) by mouth as directed. Take 12.5 mg on Tu, Th, Sat and Sun; HOLD on dialysis days M, W, F     No current facility-administered medications for this visit.    Allergies:   Doxycycline; Hydrocodone; Morphine and related; Amoxicillin; Ciprofloxacin; Clindamycin/lincomycin; Codeine; Levofloxacin; Rocephin; Vasotec; Contrast media; Oxycodone; and Plavix    Social History:  The patient  reports that he has never smoked. He has never used smokeless tobacco. He reports that he drinks alcohol. He reports that he does not use illicit drugs.   Family History:  [name] family history includes Breast cancer in his maternal aunt; Cancer in his father and maternal aunt; Colon polyps in his father; Diabetes in his father and mother; Hyperlipidemia in  his mother; Hypertension in his father and mother. There is  no history of Malignant hyperthermia, Heart attack, or Stroke.    ROS:   Please see the history of present illness.   Review of Systems  Constitution: Positive for malaise/fatigue.  Cardiovascular: Positive for dyspnea on exertion.  Respiratory: Positive for shortness of breath and snoring.   Hematologic/Lymphatic: Bruises/bleeds easily.  Skin: Positive for rash.  Musculoskeletal: Positive for back pain and myalgias.  Gastrointestinal: Positive for constipation.  All other systems reviewed and are negative.     PHYSICAL EXAM: VS:  BP 160/70 mmHg  Pulse 82  Ht 5\' 8"  (1.727 m)  Wt 288 lb (130.636 kg)  BMI 43.80 kg/m2    Wt Readings from Last 3 Encounters:  11/27/14 288 lb (130.636 kg)  11/20/14 291 lb 3.2 oz (132.087 kg)  11/06/14 291 lb 3.2 oz (132.087 kg)     GEN: Well nourished, well developed, in no acute distress HEENT: normal Neck: no JVD at 90 degrees, no masses Cardiac:  Normal S1/S2, RRR; no murmur ,  no rubs or gallops, LLE BKA; no RLE edema Respiratory:  Decreased breath sounds bilaterally, no wheezing, rhonchi or rales. GI: soft, nontender, nondistended MS: s/p L BKA Skin: warm and dry  Neuro:  CNs II-XII intact, Strength and sensation are intact Psych: Normal affect   EKG:  EKG is ordered today.  It demonstrates:   NSR, HR 82, normal axis, no change from prior tracings   Recent Labs: 09/30/2014: ALT 16* 10/01/2014: Magnesium 2.7* 10/03/2014: BUN 31*; Creatinine, Ser 9.00*; Platelets 234 11/20/2014: Hemoglobin 12.6*; Potassium 4.4; Sodium 139    Lipid Panel No results found for: CHOL, TRIG, HDL, CHOLHDL, VLDL, LDLCALC, LDLDIRECT    ASSESSMENT AND PLAN:  Coronary artery disease involving native coronary artery of native heart without angina pectoris:  No angina. His beta-blocker was DC'd during his admit for low BP.  Now BP running higher.  Will resume beta-blocker.  Take Toprol-XL 12.5 mg on  non-dialysis days.  Continue ASA, Effient, statin.  Chronic diastolic CHF (congestive heart failure):  Volume management per dialysis.  ESRD (end stage renal disease):  He is on MWF dialysis.  Essential hypertension:  BP elevated.  Resume Toprol-XL 12.5 mg QD.  HLD (hyperlipidemia):  Continue statin.  Rash:  He is still getting occasional pruritic rash.  I have asked him to FU with his dermatologist.    Medication Changes: Current medicines are reviewed at length with the patient today.  Concerns regarding medicines are as outlined above.  The following changes have been made:   Discontinued Medications   No medications on file   Modified Medications   Modified Medication Previous Medication   PROMETHAZINE (PHENERGAN) 25 MG TABLET promethazine (PHENERGAN) 25 MG tablet      Take 1 tablet (25 mg total) by mouth every 6 (six) hours as needed for nausea or vomiting. For nausea    Take 25 mg by mouth Every 6 hours as needed for nausea or vomiting. For nausea   New Prescriptions   METOPROLOL SUCCINATE (TOPROL-XL) 25 MG 24 HR TABLET    Take 0.5 tablets (12.5 mg total) by mouth as directed. Take 12.5 mg on Tu, Th, Sat and Sun; HOLD on dialysis days M, W, F    Labs/ tests ordered today include:   Orders Placed This Encounter  Procedures  . EKG 12-Lead     Disposition:   FU with Dr. Lauree Chandler or me 6 mos.   Signed, Versie Starks, MHS 11/27/2014 12:37 PM    [facility] [address], [place], [place]  [address] Phone: [phone]; Fax: [phone]

## 2014-11-27 ENCOUNTER — Encounter: Payer: Self-pay | Admitting: Physician Assistant

## 2014-11-27 ENCOUNTER — Ambulatory Visit (INDEPENDENT_AMBULATORY_CARE_PROVIDER_SITE_OTHER): Payer: Medicare Other | Admitting: Physician Assistant

## 2014-11-27 VITALS — BP 160/70 | HR 82 | Ht 68.0 in | Wt 288.0 lb

## 2014-11-27 DIAGNOSIS — I10 Essential (primary) hypertension: Secondary | ICD-10-CM | POA: Diagnosis not present

## 2014-11-27 DIAGNOSIS — I5032 Chronic diastolic (congestive) heart failure: Secondary | ICD-10-CM

## 2014-11-27 DIAGNOSIS — I2510 Atherosclerotic heart disease of native coronary artery without angina pectoris: Secondary | ICD-10-CM | POA: Diagnosis not present

## 2014-11-27 DIAGNOSIS — N186 End stage renal disease: Secondary | ICD-10-CM

## 2014-11-27 DIAGNOSIS — E785 Hyperlipidemia, unspecified: Secondary | ICD-10-CM | POA: Insufficient documentation

## 2014-11-27 MED ORDER — METOPROLOL SUCCINATE ER 25 MG PO TB24: 12.5000 mg | ORAL_TABLET | ORAL | Status: DC

## 2014-11-27 MED ORDER — PROMETHAZINE HCL 25 MG PO TABS: 25.0000 mg | ORAL_TABLET | Freq: Four times a day (QID) | ORAL | Status: DC | PRN

## 2014-11-27 NOTE — Patient Instructions (Signed)
Medication Instructions:  1. RESUME METOPROLOL 25 MG DIRECTIONS ARE : TAKE 1/2 TABLET ON TU, TH, SAT & SUN; HOLD ON DIALYSIS DAYS M, W, F 2. A REFILL HAS BEEN SENT IN FOR PHENERGAN  3. YOU HAVE BEEN GIVEN EFFIENT SAMPLES  Labwork: NONE  Testing/Procedures: NONE  Follow-Up: Your physician wants you to follow-up in: [place] DR. [name]. You will receive a reminder letter in the mail two months in advance. If you don't receive a letter, please call our office to schedule the follow-up appointment.   Any Other Special Instructions Will Be Listed Below (If Applicable).

## 2014-12-16 ENCOUNTER — Encounter: Payer: Self-pay | Admitting: Vascular Surgery

## 2014-12-17 ENCOUNTER — Ambulatory Visit (INDEPENDENT_AMBULATORY_CARE_PROVIDER_SITE_OTHER): Payer: Self-pay | Admitting: Vascular Surgery

## 2014-12-17 ENCOUNTER — Encounter: Payer: Medicare Other | Admitting: Vascular Surgery

## 2014-12-17 ENCOUNTER — Encounter: Payer: Self-pay | Admitting: Vascular Surgery

## 2014-12-17 VITALS — BP 152/59 | HR 79 | Temp 98.2°F | Ht 68.0 in | Wt 288.0 lb

## 2014-12-17 DIAGNOSIS — N185 Chronic kidney disease, stage 5: Secondary | ICD-10-CM

## 2014-12-17 NOTE — Progress Notes (Signed)
Patient name: Dale [name] MRN: [id number] DOB: [birth year] Sex: male  REASON FOR VISIT: who underwent revision of a right brachiocephalic AV fistula.   [id number] [name] is a 49 y.o. male  who underwent revision of a right brachiocephalic AV fistula. I plicated an aneurysm. They have been sticking his fistula above and below the revision. We have been asked to perform a venogram on the left C visa potentially a candidate for access in the left arm.  Current Outpatient Prescriptions  Medication Sig Dispense Refill  . allopurinol (ZYLOPRIM) 100 MG tablet Take 200 mg by mouth daily.     [name] Kitchen aspirin 81 MG tablet Take 81 mg by mouth every evening.     [name] Kitchen atorvastatin (LIPITOR) 10 MG tablet Take 10 mg by mouth every evening.     . calcium acetate (PHOSLO) 667 MG capsule Take 667-1,334 mg by mouth See admin instructions. Take 1,334 mg (2 capsules) with meals and 667 (1 capsule) with snacks    . diphenhydrAMINE (BENADRYL) 25 mg capsule Take 1-2 capsules (25-50 mg total) by mouth every 6 (six) hours as needed for itching. 30 capsule 0  . docusate sodium (COLACE) 100 MG capsule Take 200 mg by mouth 3 (three) times daily.     [name] Kitchen EFFIENT 10 MG TABS tablet TAKE 1 TABLET BY MOUTH EVERY DAY 30 tablet 0  . furosemide (LASIX) 80 MG tablet Take 80 mg by mouth 2 (two) times daily.   98  . GLUCAGON EMERGENCY 1 MG injection Inject 1 mg into the muscle daily as needed (low blood sugar).     . hydrocortisone 2.5 % cream APPLY 2-3 TIMES DAILY TO AFFECTED AREA(S).  1  . HYDROmorphone (DILAUDID) 2 MG tablet Take 1 tablet (2 mg total) by mouth 3 (three) times daily as needed for severe pain. 20 tablet 0  . Insulin Human (INSULIN PUMP) 100 unit/ml SOLN Inject into the skin continuous. Humalog    . levothyroxine (SYNTHROID, LEVOTHROID) 100 MCG tablet Take 100 mcg by mouth daily before breakfast.    . loratadine (CLARITIN) 10 MG tablet Take 10 mg by mouth daily.    . metoCLOPramide (REGLAN) 5 MG tablet Take 5 mg by mouth  4 (four) times daily - after meals and at bedtime.    . metoprolol succinate (TOPROL-XL) 25 MG 24 hr tablet Take 0.5 tablets (12.5 mg total) by mouth as directed. Take 12.5 mg on Tu, Th, Sat and Sun; HOLD on dialysis days M, W, F    . multivitamin (RENA-VIT) TABS tablet Take 1 tablet by mouth daily.    . nitroGLYCERIN (NITROSTAT) 0.4 MG SL tablet Place 0.4 mg under the tongue every 5 (five) minutes as needed for chest pain.     . promethazine (PHENERGAN) 25 MG tablet Take 1 tablet (25 mg total) by mouth every 6 (six) hours as needed for nausea or vomiting. For nausea 30 tablet 0  . ranitidine (ZANTAC) 150 MG tablet Take 150 mg by mouth 2 (two) times daily.  4  . rOPINIRole (REQUIP) 2 MG tablet Take 2 mg by mouth daily with supper.     . sevelamer carbonate (RENVELA) 800 MG tablet Take 1,600 mg by mouth 3 (three) times daily with meals.     No current facility-administered medications for this visit.   REVIEW OF SYSTEMS: Valu.Nieves ] denotes positive finding; [  ] denotes negative finding  CARDIOVASCULAR:  [ ]  chest pain   [ ]  dyspnea on exertion  CONSTITUTIONAL:  [ ]  fever   [ ]  chills  PHYSICAL EXAM: There were no vitals filed for this visit. GENERAL: The patient is a well-nourished male, in no acute distress. The vital signs are documented above. CARDIOVASCULAR: There is a regular rate and rhythm. PULMONARY: There is good air exchange bilaterally without wheezing or rales. This fistula has a good thrill and bruit. 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 On the left side he does have some collaterals around the upper arm suggesting a central venous occlusion on the left. He has previously had a right thigh graft which was removed for infection. He is previously had a left forearm graft.  MEDICAL ISSUES:  STATUS POST REVISION OF RIGHT BRACHIOCEPHALIC FISTULA: This fistula is working well. In another month I think it would be reasonable to cannulate the revised segment.  I plicated the lateral wall, preserving the anterior wall for future cannulation. We will arrange for a central venogram on the left to see if he is a candidate for access in the left arm. If the central veins are open then he can be set up for a fistular graft in the left upper arm. I do not see a recent vein map. Given that he is a difficult access problem he will need a preoperative arterial study on the right and also vein mapping.  Dale [name] [facility]: [phone]

## 2014-12-18 ENCOUNTER — Other Ambulatory Visit: Payer: Self-pay

## 2014-12-18 MED ORDER — PREDNISONE 50 MG PO TABS: ORAL_TABLET | ORAL | Status: DC

## 2014-12-24 MED ORDER — SODIUM CHLORIDE 0.9 % IJ SOLN: 3.0000 mL | INTRAMUSCULAR | Status: DC | PRN

## 2014-12-25 ENCOUNTER — Encounter (HOSPITAL_COMMUNITY): Payer: Self-pay | Admitting: Vascular Surgery

## 2014-12-25 ENCOUNTER — Encounter (HOSPITAL_COMMUNITY)
Admission: RE | Disposition: A | Discharge status: Home / Self Care | Payer: Self-pay | Source: Ambulatory Visit | Attending: Vascular Surgery

## 2014-12-25 ENCOUNTER — Ambulatory Visit (HOSPITAL_COMMUNITY)
Admission: RE | Admit: 2014-12-25 | Discharge: 2014-12-25 | Disposition: A | Discharge status: Home / Self Care | Payer: Medicare Other | Source: Ambulatory Visit | Attending: Vascular Surgery | Admitting: Vascular Surgery

## 2014-12-25 DIAGNOSIS — N186 End stage renal disease: Secondary | ICD-10-CM | POA: Diagnosis not present

## 2014-12-25 DIAGNOSIS — Z992 Dependence on renal dialysis: Secondary | ICD-10-CM | POA: Insufficient documentation

## 2014-12-25 DIAGNOSIS — N185 Chronic kidney disease, stage 5: Secondary | ICD-10-CM | POA: Diagnosis not present

## 2014-12-25 HISTORY — PX: PERIPHERAL VASCULAR CATHETERIZATION: SHX172C

## 2014-12-25 LAB — POCT I-STAT, CHEM 8
BUN: 54 mg/dL — ABNORMAL HIGH (ref 6–20)
Calcium, Ion: 1.18 mmol/L (ref 1.12–1.23)
Chloride: 99 mmol/L — ABNORMAL LOW (ref 101–111)
Creatinine, Ser: 6.8 mg/dL — ABNORMAL HIGH (ref 0.61–1.24)
Glucose, Bld: 252 mg/dL — ABNORMAL HIGH (ref 65–99)
HCT: 42 % (ref 39.0–52.0)
Hemoglobin: 14.3 g/dL (ref 13.0–17.0)
Potassium: 5 mmol/L (ref 3.5–5.1)
Sodium: 138 mmol/L (ref 135–145)
TCO2: 26 mmol/L (ref 0–100)

## 2014-12-25 SURGERY — UPPER EXTREMITY VENOGRAPHY
Anesthesia: LOCAL

## 2014-12-25 MED ORDER — SODIUM CHLORIDE 0.9 % IJ SOLN: 3.0000 mL | INTRAMUSCULAR | Status: DC | PRN

## 2014-12-25 MED ORDER — IODIXANOL 320 MG/ML IV SOLN
INTRAVENOUS | Status: DC | PRN
  Administered 2014-12-25: 65 mL via INTRAVENOUS

## 2014-12-25 MED ORDER — ACETAMINOPHEN 325 MG PO TABS: 650.0000 mg | ORAL_TABLET | ORAL | Status: DC | PRN

## 2014-12-25 MED ORDER — SODIUM CHLORIDE 0.9 % IJ SOLN: 3.0000 mL | Freq: Two times a day (BID) | INTRAMUSCULAR | Status: DC

## 2014-12-25 MED ORDER — SODIUM CHLORIDE 0.9 % IV SOLN: 250.0000 mL | INTRAVENOUS | Status: DC | PRN

## 2014-12-25 SURGICAL SUPPLY — 2 items
SYR CONTROL 10ML ANGIOGRAPHIC (SYRINGE) ×1 IMPLANT
TUBING CIL FLEX 10 FLL-RA (TUBING) ×1 IMPLANT

## 2014-12-25 NOTE — Interval H&P Note (Signed)
History and Physical Interval Note:  12/25/2014 7:22 AM  Dale [name]  has presented today for surgery, with the diagnosis of end stage renal  The various methods of treatment have been discussed with the patient and family. After consideration of risks, benefits and other options for treatment, the patient has consented to  Procedure(s): Upper Extremity Venography (N/A) as a surgical intervention .  [name] history has been reviewed, patient examined, no change in status, stable for surgery.  I have reviewed the patient's chart and labs.  Questions were answered to the patient's satisfaction.     [name]

## 2014-12-25 NOTE — Discharge Instructions (Signed)
Venogram, Care After °Refer to this sheet in the next few weeks. These instructions provide you with information on caring for yourself after your procedure. Your health care provider may also give you more specific instructions. Your treatment has been planned according to current medical practices, but problems sometimes occur. Call your health care provider if you have any problems or questions after your procedure. °WHAT TO EXPECT AFTER THE PROCEDURE °After your procedure, it is typical to have the following sensations: °· Mild discomfort at the catheter insertion site. °HOME CARE INSTRUCTIONS  °· Take all medicines exactly as directed. °· Follow any prescribed diet. °· Follow instructions regarding both rest and physical activity. °· Drink more fluids for the first several days after the procedure in order to help flush dye from your kidneys. °SEEK MEDICAL CARE IF: °· You develop a rash. °· You have fever not controlled by medicine. °SEEK IMMEDIATE MEDICAL CARE IF: °· There is pain, drainage, bleeding, redness, swelling, warmth or a red streak at the site of the IV tube. °· The extremity where your IV tube was placed becomes discolored, numb, or cool. °· You have difficulty breathing or shortness of breath. °· You develop chest pain. °· You have excessive dizziness or fainting. °Document Released: 01/30/2013 Document Revised: 04/16/2013 Document Reviewed: 01/30/2013 °[employer]® Patient Information ©2015 [employer]. This information is not intended to replace advice given to you by your health care provider. Make sure you discuss any questions you have with your health care provider. ° °

## 2014-12-25 NOTE — H&P (View-Only) (Signed)
Patient name: Dale [name] MRN: [id number] DOB: 1965-12-31 Sex: male  REASON FOR VISIT: who underwent revision of a right brachiocephalic AV fistula.   [id number] [name] is a 49 y.o. male  who underwent revision of a right brachiocephalic AV fistula. I plicated an aneurysm. They have been sticking his fistula above and below the revision. We have been asked to perform a venogram on the left C visa potentially a candidate for access in the left arm.  Current Outpatient Prescriptions  Medication Sig Dispense Refill  . allopurinol (ZYLOPRIM) 100 MG tablet Take 200 mg by mouth daily.     [name] Kitchen aspirin 81 MG tablet Take 81 mg by mouth every evening.     [name] Kitchen atorvastatin (LIPITOR) 10 MG tablet Take 10 mg by mouth every evening.     . calcium acetate (PHOSLO) 667 MG capsule Take 667-1,334 mg by mouth See admin instructions. Take 1,334 mg (2 capsules) with meals and 667 (1 capsule) with snacks    . diphenhydrAMINE (BENADRYL) 25 mg capsule Take 1-2 capsules (25-50 mg total) by mouth every 6 (six) hours as needed for itching. 30 capsule 0  . docusate sodium (COLACE) 100 MG capsule Take 200 mg by mouth 3 (three) times daily.     [name] Kitchen EFFIENT 10 MG TABS tablet TAKE 1 TABLET BY MOUTH EVERY DAY 30 tablet 0  . furosemide (LASIX) 80 MG tablet Take 80 mg by mouth 2 (two) times daily.   98  . GLUCAGON EMERGENCY 1 MG injection Inject 1 mg into the muscle daily as needed (low blood sugar).     . hydrocortisone 2.5 % cream APPLY 2-3 TIMES DAILY TO AFFECTED AREA(S).  1  . HYDROmorphone (DILAUDID) 2 MG tablet Take 1 tablet (2 mg total) by mouth 3 (three) times daily as needed for severe pain. 20 tablet 0  . Insulin Human (INSULIN PUMP) 100 unit/ml SOLN Inject into the skin continuous. Humalog    . levothyroxine (SYNTHROID, LEVOTHROID) 100 MCG tablet Take 100 mcg by mouth daily before breakfast.    . loratadine (CLARITIN) 10 MG tablet Take 10 mg by mouth daily.    . metoCLOPramide (REGLAN) 5 MG tablet Take 5 mg by mouth  4 (four) times daily - after meals and at bedtime.    . metoprolol succinate (TOPROL-XL) 25 MG 24 hr tablet Take 0.5 tablets (12.5 mg total) by mouth as directed. Take 12.5 mg on Tu, Th, Sat and Sun; HOLD on dialysis days M, W, F    . multivitamin (RENA-VIT) TABS tablet Take 1 tablet by mouth daily.    . nitroGLYCERIN (NITROSTAT) 0.4 MG SL tablet Place 0.4 mg under the tongue every 5 (five) minutes as needed for chest pain.     . promethazine (PHENERGAN) 25 MG tablet Take 1 tablet (25 mg total) by mouth every 6 (six) hours as needed for nausea or vomiting. For nausea 30 tablet 0  . ranitidine (ZANTAC) 150 MG tablet Take 150 mg by mouth 2 (two) times daily.  4  . rOPINIRole (REQUIP) 2 MG tablet Take 2 mg by mouth daily with supper.     . sevelamer carbonate (RENVELA) 800 MG tablet Take 1,600 mg by mouth 3 (three) times daily with meals.     No current facility-administered medications for this visit.   REVIEW OF SYSTEMS: Valu.Nieves ] denotes positive finding; [  ] denotes negative finding  CARDIOVASCULAR:  [ ]  chest pain   [ ]  dyspnea on exertion  CONSTITUTIONAL:  [ ]  fever   [ ]  chills  PHYSICAL EXAM: There were no vitals filed for this visit. GENERAL: The patient is a well-nourished male, in no acute distress. The vital signs are documented above. CARDIOVASCULAR: There is a regular rate and rhythm. PULMONARY: There is good air exchange bilaterally without wheezing or rales. This fistula has a good thrill and bruit. 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 On the left side he does have some collaterals around the upper arm suggesting a central venous occlusion on the left. He has previously had a right thigh graft which was removed for infection. He is previously had a left forearm graft.  MEDICAL ISSUES:  STATUS POST REVISION OF RIGHT BRACHIOCEPHALIC FISTULA: This fistula is working well. In another month I think it would be reasonable to cannulate the revised segment.  I plicated the lateral wall, preserving the anterior wall for future cannulation. We will arrange for a central venogram on the left to see if he is a candidate for access in the left arm. If the central veins are open then he can be set up for a fistular graft in the left upper arm. I do not see a recent vein map. Given that he is a difficult access problem he will need a preoperative arterial study on the right and also vein mapping.  [name] [facility] [place]: [phone]

## 2014-12-25 NOTE — Progress Notes (Signed)
Pt states took 50mg  Prednosone 12/24/14; pt did not take second dose, states his heart felt liked it was racing

## 2014-12-26 ENCOUNTER — Telehealth: Payer: Self-pay | Admitting: Vascular Surgery

## 2014-12-26 NOTE — Telephone Encounter (Addendum)
-----   Message from Mena Goes, RN sent at 12/25/2014 10:27 AM EDT ----- Regarding: Schedule   ----- Message -----    From: Conrad [place], MD    Sent: 12/25/2014   7:44 AM      To: [address]  Silvestre Waddy [id number] 08-07-1965  PROCEDURE: 1. left arm and central venogram   Follow-up: 4-6 weeks with Dr. Scot Dock at scheduled date  notified paitent of fu with dr. Scot Dock on 01-28-15 at 2:15

## 2014-12-31 ENCOUNTER — Telehealth: Payer: Self-pay | Admitting: *Deleted

## 2014-12-31 NOTE — Telephone Encounter (Signed)
Patient had right arm fistulogram last week by Dr. Bridgett Larsson, He states that he has had numerous blisters on his right arm. He is allergic to contrast dyes and was premedicated prior to his fistulogram. He is not having any signs of anaphylaxis, he is afebrile. Per patient he is having HD via this fistula on Monday, Wednesday and Friday mornings at [facility]. The [facility] is aware of these blisters and told patient to call and get an earlier appt (originally 01-28-15). He says that the blisters are getting worse and more numerous. The [facility] doctor is going to monitor him for infection until next week when he is seen (01-07-15 by Dr. Scot Dock)

## 2015-01-06 ENCOUNTER — Encounter: Payer: Self-pay | Admitting: Vascular Surgery

## 2015-01-07 ENCOUNTER — Ambulatory Visit (INDEPENDENT_AMBULATORY_CARE_PROVIDER_SITE_OTHER): Payer: Medicare Other | Admitting: Vascular Surgery

## 2015-01-07 ENCOUNTER — Encounter: Payer: Self-pay | Admitting: Vascular Surgery

## 2015-01-07 VITALS — BP 153/74 | HR 80 | Temp 98.8°F | Resp 16 | Ht 68.0 in | Wt 277.8 lb

## 2015-01-07 DIAGNOSIS — N186 End stage renal disease: Secondary | ICD-10-CM | POA: Diagnosis not present

## 2015-01-07 DIAGNOSIS — Z992 Dependence on renal dialysis: Secondary | ICD-10-CM | POA: Diagnosis not present

## 2015-01-07 NOTE — Progress Notes (Signed)
Filed Vitals:   01/07/15 1410 01/07/15 1418  BP: 162/78 153/74  Pulse: 80 80  Temp: 98.8 F (37.1 C)   TempSrc: Oral   Resp: 16   Height: 5\' 8"  (1.727 m)   Weight: 277 lb 12.5 oz (126 kg)   SpO2: 95%

## 2015-01-07 NOTE — Progress Notes (Signed)
HISTORY AND PHYSICAL   History of Present Illness:  Patient is a 49 y.o. year old male who presents for evaluation of The left upper extremity after having a venogram to determine if HD access is possible on the left UE.  He is currently on HD via a right AV Fistula that is aneurysmal and his arm is chronically swollen with blisters at time(created 07/29/11 by Dr. Kellie Simmering). He dialyzes on Mondays, Wednesdays and Fridays. He reports no issues with dialysis and has had adequate flow rates. He has a known left subclavian stenosis.  Other medical problems include has History of renal transplant; S/P BKA (below knee amputation); Obstructive sleep apnea; Dyspnea; Chronic diastolic CHF (congestive heart failure); Anemia; Diabetes mellitus with nephropathy; Constipation, acute; ESRD (end stage renal disease); Abdominal pain; Pericardial effusion; Colitis, ischemic; Swelling of arm; Chest pain; Fever and chills; Diarrhea; Nausea; Leukocytosis; AKI (acute kidney injury); Acute respiratory failure; Septic shock; Metabolic acidosis; Salmonella bacteremia; Salmonella gastroenteritis; Acute respiratory failure with hypoxia; Tracheobronchitis; Influenza with other respiratory manifestations; HTN (hypertension); Unstable angina; CAD (coronary artery disease); Allergic reaction to dye; Hypoxia; Dizziness; Hypotension; ESRD (end stage renal disease) on dialysis; and HLD (hyperlipidemia) on his problem list.  Past Medical History  Diagnosis Date  . Hypertension   . Anemia of chronic disease   . Cholecystitis     a. 08/27/2011  . Constipation   . Pericardial effusion     a.  Small by CT 08/22/11;  b.  Large by CT 08/27/11  . Inguinal lymphadenopathy     a. bilateral - s/p biopsy 07/2011  . Fibromyalgia   . Chronic diastolic CHF (congestive heart failure)     a. Echo 3/13:  EF 55-60%;  b. Echo (11/15):  Mild LVH, EF 60-65%, no RWMA, Gr 1 DD, MAC, mild LAE, normal RVF, mild RAE;  c.  Echo 5/16:  severe LVH, EF 55-60%, no  RWMA, Gr 1 DD, MAC, mild LAE  . HLD (hyperlipidemia)   . Pneumonia ~ 2007  . OSA (obstructive sleep apnea)     intol of CPAP mask  . Claustrophobia   . Hypothyroidism   . Insulin dependent diabetes mellitus   . GERD (gastroesophageal reflux disease)   . Arthritis     "hands, right knee" (03/19/2014)  . ESRD (end stage renal disease)     a. 1995 s/p cadaveric transplant w/ susbequent failure after 18 yrs;  b.Dialysis initiated 07/2011  . CAD (coronary artery disease)     a. Lexiscan Myoview (11/15):  Inf, apical cap and apical lateral ischemia, EF 56%, inf HK, Moderate Risk;  b. LHC (11/15):  mid to dist Dx 80%, mid RCA 99% (functional CTO), dist RCA 80% >> PCI: balloon angioplasty to mid RCA (could not deliver stent) - plan staged PCI with HSRA of RCA; Dx to be tx medically >> PCI: Rotoblator atherectomy/DES to [facility]    Past Surgical History  Procedure Laterality Date  . Kidney transplant  04/25/1993  . Cataract extraction w/ intraocular lens  implant, bilateral Bilateral 1990's  . Arteriovenous graft placement Left 1993?    forearm  . Arteriovenous graft placement Right 1993?    leg  . Toe amputation Bilateral after 2005  . Below knee leg amputation Left 2010  . Carpal tunnel release Bilateral after 2005  . Shoulder arthroscopy Left ~ 2006    frozen  . Av fistula placement  07/29/2011    Procedure: ARTERIOVENOUS (AV) FISTULA CREATION;  Surgeon: Mal Misty, MD;  Location: [facility] OR;  Service: Vascular;  Laterality: Right;  Brachial cephalic  . Insertion of dialysis catheter  08/01/2011    Procedure: INSERTION OF DIALYSIS CATHETER;  Surgeon: Rosetta Posner, MD;  Location: [place];  Service: Vascular;  Laterality: Right;  insertion of dialysis catheter on right internal jugular vein  . Lymph node biopsy  08/10/2011    Procedure: LYMPH NODE BIOPSY;  Surgeon: Merrie Roof, MD;  Location: [place];  Service: General;  Laterality: Left;  left groin lymph biopsy  . Colonoscopy  08/29/2011     Procedure: COLONOSCOPY;  Surgeon: Jerene Bears, MD;  Location: [place];  Service: Gastroenterology;  Laterality: N/A;  . Appendectomy  ~ 2006  . Hernia repair    . Umbilical hernia repair  1990's  . Cardiac catheterization  03/19/2014  . Av fistula placement Left 1993?    "attempted one in my wrist; didn't take"  . Neuroplasty / transposition ulnar nerve at elbow Left after 2005  . Venogram Right 07/28/2011    Procedure: VENOGRAM;  Surgeon: Conrad Winamac, MD;  Location: [facility] CATH LAB;  Service: Cardiovascular;  Laterality: Right;  . Left heart catheterization with coronary angiogram N/A 03/19/2014    Procedure: LEFT HEART CATHETERIZATION WITH CORONARY ANGIOGRAM;  Surgeon: Burnell Blanks, MD;  Location: [facility] CATH LAB;  Service: Cardiovascular;  Laterality: N/A;  . Cardiac catheterization  03/19/2014    Procedure: CORONARY BALLOON ANGIOPLASTY;  Surgeon: Burnell Blanks, MD;  Location: [facility] CATH LAB;  Service: Cardiovascular;;  . Coronary angioplasty with stent placement  04/09/2014        ptca/des mid lad   . Angioplasty  04/09/2014    RCA   . Femoral artery repair  04/09/2014    ANGIOSEAL  . Eye surgery    . Percutaneous coronary rotoblator intervention (pci-r) N/A 04/09/2014    Procedure: PERCUTANEOUS CORONARY ROTOBLATOR INTERVENTION (PCI-R);  Surgeon: Burnell Blanks, MD;  Location: [facility] CATH LAB;  Service: Cardiovascular;  Laterality: N/A;  . Revison of arteriovenous fistula Right [id number]    Procedure: PLICATION / REVISION OF ARTERIOVENOUS FISTULA;  Surgeon: Angelia Mould, MD;  Location: [place];  Service: Vascular;  Laterality: Right;  . Peripheral vascular catheterization N/A 12/25/2014    Procedure: Upper Extremity Venography;  Surgeon: Conrad Hayward, MD;  Location: [place] CV LAB;  Service: Cardiovascular;  Laterality: N/A;    Social History Social History  Substance Use Topics  . Smoking status: Never Smoker   . Smokeless tobacco: Never Used  .  Alcohol Use: Yes     Comment: 11/19/14 "might have a drink 2-3 times/year"    Family History Family History  Problem Relation Age of Onset  . Malignant hyperthermia Neg Hx   . Heart attack Neg Hx   . Stroke Neg Hx   . Colon polyps Father   . Diabetes Father   . Cancer Father   . Hypertension Father   . Breast cancer Maternal Aunt   . Cancer Maternal Aunt     Breast and Bone  . Hypertension Mother   . Diabetes Mother   . Hyperlipidemia Mother     Allergies  Allergies  Allergen Reactions  . Doxycycline     RASH  . Hydrocodone     RASH  . Morphine And Related Hives and Nausea Only  . Amoxicillin Other (See Comments)    Reaction unknown  . Ciprofloxacin Other (See Comments)    Reaction unknown  . Clindamycin/Lincomycin Other (  See Comments)    Reaction unknown  . Codeine Other (See Comments)    Reaction unknown. But pt has tolerated hydrocodone in Vicodin and Norco in the past  . Levofloxacin Other (See Comments)    Reaction unknown  . Rocephin [Ceftriaxone] Itching  . Vasotec Cough  . Contrast Media [Iodinated Diagnostic Agents] Rash  . Oxycodone Rash  . Plavix [Clopidogrel Bisulfate] Rash     Current Outpatient Prescriptions  Medication Sig Dispense Refill  . allopurinol (ZYLOPRIM) 100 MG tablet Take 200 mg by mouth daily.     [name] Kitchen aspirin 81 MG tablet Take 81 mg by mouth every evening.     [name] Kitchen atorvastatin (LIPITOR) 10 MG tablet Take 10 mg by mouth every evening.     . calcium acetate (PHOSLO) 667 MG capsule Take 667-1,334 mg by mouth See admin instructions. Take 1,334 mg (2 capsules) with meals and 667 (1 capsule) with snacks    . diphenhydrAMINE (BENADRYL) 25 mg capsule Take 1-2 capsules (25-50 mg total) by mouth every 6 (six) hours as needed for itching. 30 capsule 0  . docusate sodium (COLACE) 100 MG capsule Take 200 mg by mouth 3 (three) times daily.     [name] Kitchen EFFIENT 10 MG TABS tablet TAKE 1 TABLET BY MOUTH EVERY DAY 30 tablet 0  . furosemide (LASIX) 80 MG tablet  Take 80 mg by mouth 2 (two) times daily.   98  . GLUCAGON EMERGENCY 1 MG injection Inject 1 mg into the muscle daily as needed (low blood sugar).     . hydrocortisone 2.5 % cream APPLY 2-3 TIMES DAILY TO AFFECTED AREA(S).  1  . HYDROmorphone (DILAUDID) 2 MG tablet Take 1 tablet (2 mg total) by mouth 3 (three) times daily as needed for severe pain. 20 tablet 0  . Insulin Human (INSULIN PUMP) 100 unit/ml SOLN Inject into the skin continuous. Humalog    . levothyroxine (SYNTHROID, LEVOTHROID) 100 MCG tablet Take 100 mcg by mouth daily before breakfast.    . loratadine (CLARITIN) 10 MG tablet Take 10 mg by mouth daily.    . metoCLOPramide (REGLAN) 5 MG tablet Take 5 mg by mouth 4 (four) times daily - after meals and at bedtime.    . metoprolol succinate (TOPROL-XL) 25 MG 24 hr tablet Take 0.5 tablets (12.5 mg total) by mouth as directed. Take 12.5 mg on Tu, Th, Sat and Sun; HOLD on dialysis days M, W, F    . multivitamin (RENA-VIT) TABS tablet Take 1 tablet by mouth daily.    . nitroGLYCERIN (NITROSTAT) 0.4 MG SL tablet Place 0.4 mg under the tongue every 5 (five) minutes as needed for chest pain.     . predniSONE (DELTASONE) 50 MG tablet Take 50 mg by mouth once.    . promethazine (PHENERGAN) 25 MG tablet Take 1 tablet (25 mg total) by mouth every 6 (six) hours as needed for nausea or vomiting. For nausea 30 tablet 0  . ranitidine (ZANTAC) 150 MG tablet Take 150 mg by mouth 2 (two) times daily.  4  . rOPINIRole (REQUIP) 2 MG tablet Take 2 mg by mouth daily with supper.     . sevelamer carbonate (RENVELA) 800 MG tablet Take 1,600 mg by mouth 3 (three) times daily with meals.    . diphenhydrAMINE (BENADRYL) 50 MG tablet Take 50 mg by mouth once.     No current facility-administered medications for this visit.    ROS:   General:  No weight loss, Fever, chills  HEENT:  No recent headaches, no nasal bleeding, no visual changes, no sore throat  Neurologic: No dizziness, blackouts, seizures. No  recent symptoms of stroke or mini- stroke. No recent episodes of slurred speech, or temporary blindness.  Cardiac: No recent episodes of chest pain/pressure, no shortness of breath at rest.  No shortness of breath with exertion.  Denies history of atrial fibrillation or irregular heartbeat  Vascular: No history of rest pain in feet.  No history of claudication.  No history of non-healing ulcer, No history of DVT   Pulmonary: No home oxygen, no productive cough, no hemoptysis,  No asthma or wheezing  Musculoskeletal:  [ ]  Arthritis, [ ]  Low back pain,  [ ]  Joint pain  Hematologic:No history of hypercoagulable state.  No history of easy bleeding.  No history of anemia  Gastrointestinal: No hematochezia or melena,  No gastroesophageal reflux, no trouble swallowing  Urinary: [x ] chronic Kidney disease, [ ]  on HD - [x ] MWF or [ ]  TTHS, [ ]  Burning with urination, [ ]  Frequent urination, [ ]  Difficulty urinating;   Skin: Right UE with blisters pot- contrast dye and tape allergy  rashes  Psychological: No history of anxiety,  No history of depression   Physical Examination  Filed Vitals:   01/07/15 1410 01/07/15 1418  BP: 162/78 153/74  Pulse: 80 80  Temp: 98.8 F (37.1 C)   TempSrc: Oral   Resp: 16   Height: 5\' 8"  (1.727 m)   Weight: 277 lb 12.5 oz (126 kg)   SpO2: 95%     Body mass index is 42.25 kg/(m^2).  General:  Alert and oriented, no acute distress HEENT: Normal Neck: No bruit or JVD Pulmonary: Clear to auscultation bilaterally Cardiac: Regular Rate and Rhythm without murmur Abdomen: Soft, non-tender, non-distended, no mass, no scars Skin: No rash Extremity Pulses:  2+ radial, brachial, femoral Musculoskeletal: Left BKA, wearing prosthesis. Right leg wrapped with una boot Neurologic: Upper  extremity motor 5/5 and symmetric  DATA:   Venogram 12/25/2014 by Dr. Sherilyn Dacosta): 1. Patent left innominate vein, subclavian vein, and axillary vein 2. Patent left  basilic vein which appears large enough for use in transposition  ASSESSMENT:   ESRD on HD Chronic problems with the right UE AV Fistula with skin beak down and rashes.     PLAN:   He has patent left veins with a large enough basilic vein for the creation of a left AV fitula verses graft.  He has stopped his Effient 7 days ago and will have surgery on 01/20/2015 by Dr. Pryor Montes, Jimi Schappert [facility] PA-C [facility]  The patient was seen in conjunction with Dr. Scot Dock and surgery risks and benefits were discussed.  Agree with above. I have recommended a left basilic vein transposition or a left arm graft. Once this is functional we can ligate his right arm fistula which has been problematic. I did not recommend placement of a catheter at this time as this could significantly increase his arm swelling regardless of which side it was placed on.  [name], MD, [place] [phone] Office: [phone]

## 2015-01-08 ENCOUNTER — Other Ambulatory Visit: Payer: Self-pay

## 2015-01-14 ENCOUNTER — Other Ambulatory Visit: Payer: Self-pay | Admitting: *Deleted

## 2015-01-15 ENCOUNTER — Encounter (HOSPITAL_COMMUNITY): Payer: Self-pay | Admitting: *Deleted

## 2015-01-16 NOTE — Progress Notes (Addendum)
Patient is a Type 1 Diabetic and has an Insulin Pump.  Patient reports that his last A1C was drawn at the [facility] and it was 6.4- patient unsure when it was drawn.  I will fax a request for last lab results.  Dale [name]'s most recent Endocrinologist is Dr Iran Planas at [facility]. I faxed a request to [facility] requesting lab and last office note.    Dale [name] states that his Endocrinologist has never instructed him on  What to do with Insulin rate if he is NPO.  Patient said that he will be checking his CBG and if it starts to drop that he will back off on basal rate.  " I dont think it will be low, I'm on a Medrol Dose pack." I paged the Diabetic Coordinator on call to let her know of patient's history and day of surgery.  Dale [name] also reported that he was instructed to remain on Effient and Aspirin.  I do not find that in the plan.  I will ask Allyne Gee to call Dr Nicole Cella office on Monday to follow up on this.  I will ask Shelby Dubin to review his chart.

## 2015-01-18 ENCOUNTER — Encounter (HOSPITAL_COMMUNITY): Payer: Self-pay

## 2015-01-18 ENCOUNTER — Inpatient Hospital Stay (HOSPITAL_COMMUNITY)
Admission: EM | Admit: 2015-01-18 | Discharge: 2015-01-22 | DRG: 628 | Disposition: A | Discharge status: Home / Self Care | Payer: Medicare Other | Attending: Internal Medicine | Admitting: Internal Medicine

## 2015-01-18 ENCOUNTER — Emergency Department (HOSPITAL_COMMUNITY): Payer: Medicare Other

## 2015-01-18 DIAGNOSIS — I120 Hypertensive chronic kidney disease with stage 5 chronic kidney disease or end stage renal disease: Secondary | ICD-10-CM | POA: Diagnosis present

## 2015-01-18 DIAGNOSIS — E039 Hypothyroidism, unspecified: Secondary | ICD-10-CM | POA: Diagnosis present

## 2015-01-18 DIAGNOSIS — M1990 Unspecified osteoarthritis, unspecified site: Secondary | ICD-10-CM | POA: Diagnosis present

## 2015-01-18 DIAGNOSIS — L299 Pruritus, unspecified: Secondary | ICD-10-CM | POA: Diagnosis present

## 2015-01-18 DIAGNOSIS — T508X5A Adverse effect of diagnostic agents, initial encounter: Secondary | ICD-10-CM | POA: Diagnosis present

## 2015-01-18 DIAGNOSIS — Z8371 Family history of colonic polyps: Secondary | ICD-10-CM

## 2015-01-18 DIAGNOSIS — D638 Anemia in other chronic diseases classified elsewhere: Secondary | ICD-10-CM | POA: Diagnosis present

## 2015-01-18 DIAGNOSIS — J9601 Acute respiratory failure with hypoxia: Secondary | ICD-10-CM | POA: Diagnosis present

## 2015-01-18 DIAGNOSIS — I10 Essential (primary) hypertension: Secondary | ICD-10-CM | POA: Diagnosis present

## 2015-01-18 DIAGNOSIS — Z419 Encounter for procedure for purposes other than remedying health state, unspecified: Secondary | ICD-10-CM

## 2015-01-18 DIAGNOSIS — Z7902 Long term (current) use of antithrombotics/antiplatelets: Secondary | ICD-10-CM

## 2015-01-18 DIAGNOSIS — Z940 Kidney transplant status: Secondary | ICD-10-CM

## 2015-01-18 DIAGNOSIS — I25110 Atherosclerotic heart disease of native coronary artery with unstable angina pectoris: Secondary | ICD-10-CM | POA: Diagnosis present

## 2015-01-18 DIAGNOSIS — I871 Compression of vein: Secondary | ICD-10-CM | POA: Diagnosis present

## 2015-01-18 DIAGNOSIS — Z6841 Body Mass Index (BMI) 40.0 and over, adult: Secondary | ICD-10-CM

## 2015-01-18 DIAGNOSIS — N2581 Secondary hyperparathyroidism of renal origin: Secondary | ICD-10-CM | POA: Diagnosis present

## 2015-01-18 DIAGNOSIS — G4733 Obstructive sleep apnea (adult) (pediatric): Secondary | ICD-10-CM | POA: Diagnosis present

## 2015-01-18 DIAGNOSIS — Z955 Presence of coronary angioplasty implant and graft: Secondary | ICD-10-CM

## 2015-01-18 DIAGNOSIS — E8779 Other fluid overload: Secondary | ICD-10-CM | POA: Diagnosis not present

## 2015-01-18 DIAGNOSIS — Z7952 Long term (current) use of systemic steroids: Secondary | ICD-10-CM

## 2015-01-18 DIAGNOSIS — K219 Gastro-esophageal reflux disease without esophagitis: Secondary | ICD-10-CM | POA: Diagnosis present

## 2015-01-18 DIAGNOSIS — E1122 Type 2 diabetes mellitus with diabetic chronic kidney disease: Secondary | ICD-10-CM | POA: Diagnosis present

## 2015-01-18 DIAGNOSIS — N186 End stage renal disease: Secondary | ICD-10-CM | POA: Diagnosis present

## 2015-01-18 DIAGNOSIS — T82898A Other specified complication of vascular prosthetic devices, implants and grafts, initial encounter: Secondary | ICD-10-CM | POA: Diagnosis present

## 2015-01-18 DIAGNOSIS — Y828 Other medical devices associated with adverse incidents: Secondary | ICD-10-CM | POA: Diagnosis present

## 2015-01-18 DIAGNOSIS — G8929 Other chronic pain: Secondary | ICD-10-CM | POA: Diagnosis present

## 2015-01-18 DIAGNOSIS — D649 Anemia, unspecified: Secondary | ICD-10-CM | POA: Diagnosis present

## 2015-01-18 DIAGNOSIS — Z7982 Long term (current) use of aspirin: Secondary | ICD-10-CM

## 2015-01-18 DIAGNOSIS — R0602 Shortness of breath: Secondary | ICD-10-CM | POA: Diagnosis not present

## 2015-01-18 DIAGNOSIS — I132 Hypertensive heart and chronic kidney disease with heart failure and with stage 5 chronic kidney disease, or end stage renal disease: Secondary | ICD-10-CM | POA: Diagnosis present

## 2015-01-18 DIAGNOSIS — R0600 Dyspnea, unspecified: Secondary | ICD-10-CM | POA: Diagnosis present

## 2015-01-18 DIAGNOSIS — Z8249 Family history of ischemic heart disease and other diseases of the circulatory system: Secondary | ICD-10-CM

## 2015-01-18 DIAGNOSIS — Z89512 Acquired absence of left leg below knee: Secondary | ICD-10-CM

## 2015-01-18 DIAGNOSIS — I509 Heart failure, unspecified: Secondary | ICD-10-CM

## 2015-01-18 DIAGNOSIS — J810 Acute pulmonary edema: Secondary | ICD-10-CM

## 2015-01-18 DIAGNOSIS — E785 Hyperlipidemia, unspecified: Secondary | ICD-10-CM | POA: Diagnosis present

## 2015-01-18 DIAGNOSIS — Z794 Long term (current) use of insulin: Secondary | ICD-10-CM

## 2015-01-18 DIAGNOSIS — Z91041 Radiographic dye allergy status: Secondary | ICD-10-CM

## 2015-01-18 DIAGNOSIS — Z992 Dependence on renal dialysis: Secondary | ICD-10-CM

## 2015-01-18 DIAGNOSIS — Z9641 Presence of insulin pump (external) (internal): Secondary | ICD-10-CM

## 2015-01-18 DIAGNOSIS — I5033 Acute on chronic diastolic (congestive) heart failure: Secondary | ICD-10-CM | POA: Diagnosis present

## 2015-01-18 DIAGNOSIS — I5032 Chronic diastolic (congestive) heart failure: Secondary | ICD-10-CM | POA: Diagnosis present

## 2015-01-18 DIAGNOSIS — Z803 Family history of malignant neoplasm of breast: Secondary | ICD-10-CM

## 2015-01-18 DIAGNOSIS — Z961 Presence of intraocular lens: Secondary | ICD-10-CM | POA: Diagnosis present

## 2015-01-18 DIAGNOSIS — T50995A Adverse effect of other drugs, medicaments and biological substances, initial encounter: Secondary | ICD-10-CM | POA: Diagnosis present

## 2015-01-18 DIAGNOSIS — Z833 Family history of diabetes mellitus: Secondary | ICD-10-CM

## 2015-01-18 DIAGNOSIS — M797 Fibromyalgia: Secondary | ICD-10-CM | POA: Diagnosis present

## 2015-01-18 DIAGNOSIS — E8770 Fluid overload, unspecified: Secondary | ICD-10-CM | POA: Diagnosis present

## 2015-01-18 LAB — CBC WITH DIFFERENTIAL/PLATELET
Basophils Absolute: 0.1 10*3/uL (ref 0.0–0.1)
Basophils Relative: 1 %
Eosinophils Absolute: 1.4 10*3/uL — ABNORMAL HIGH (ref 0.0–0.7)
Eosinophils Relative: 14 %
HCT: 34.1 % — ABNORMAL LOW (ref 39.0–52.0)
Hemoglobin: 10.7 g/dL — ABNORMAL LOW (ref 13.0–17.0)
Lymphocytes Relative: 19 %
Lymphs Abs: 1.8 10*3/uL (ref 0.7–4.0)
MCH: 32.4 pg (ref 26.0–34.0)
MCHC: 31.4 g/dL (ref 30.0–36.0)
MCV: 103.3 fL — ABNORMAL HIGH (ref 78.0–100.0)
Monocytes Absolute: 0.9 10*3/uL (ref 0.1–1.0)
Monocytes Relative: 9 %
Neutro Abs: 5.3 10*3/uL (ref 1.7–7.7)
Neutrophils Relative %: 57 %
Platelets: 229 10*3/uL (ref 150–400)
RBC: 3.3 MIL/uL — ABNORMAL LOW (ref 4.22–5.81)
RDW: 16.6 % — ABNORMAL HIGH (ref 11.5–15.5)
WBC: 9.5 10*3/uL (ref 4.0–10.5)

## 2015-01-18 LAB — COMPREHENSIVE METABOLIC PANEL
ALT: 18 U/L (ref 17–63)
AST: 20 U/L (ref 15–41)
Albumin: 3.4 g/dL — ABNORMAL LOW (ref 3.5–5.0)
Alkaline Phosphatase: 170 U/L — ABNORMAL HIGH (ref 38–126)
Anion gap: 13 (ref 5–15)
BUN: 58 mg/dL — ABNORMAL HIGH (ref 6–20)
CO2: 27 mmol/L (ref 22–32)
Calcium: 8.9 mg/dL (ref 8.9–10.3)
Chloride: 98 mmol/L — ABNORMAL LOW (ref 101–111)
Creatinine, Ser: 8.97 mg/dL — ABNORMAL HIGH (ref 0.61–1.24)
GFR calc Af Amer: 7 mL/min — ABNORMAL LOW (ref >60)
GFR calc non Af Amer: 6 mL/min — ABNORMAL LOW (ref >60)
Glucose, Bld: 81 mg/dL (ref 65–99)
Potassium: 4.8 mmol/L (ref 3.5–5.1)
Sodium: 138 mmol/L (ref 135–145)
Total Bilirubin: 0.6 mg/dL (ref 0.3–1.2)
Total Protein: 7.2 g/dL (ref 6.5–8.1)

## 2015-01-18 LAB — I-STAT CHEM 8, ED
BUN: 56 mg/dL — ABNORMAL HIGH (ref 6–20)
Calcium, Ion: 1.1 mmol/L — ABNORMAL LOW (ref 1.12–1.23)
Chloride: 103 mmol/L (ref 101–111)
Creatinine, Ser: 8.3 mg/dL — ABNORMAL HIGH (ref 0.61–1.24)
Glucose, Bld: 89 mg/dL (ref 65–99)
HCT: 35 % — ABNORMAL LOW (ref 39.0–52.0)
Hemoglobin: 11.9 g/dL — ABNORMAL LOW (ref 13.0–17.0)
Potassium: 4.9 mmol/L (ref 3.5–5.1)
Sodium: 139 mmol/L (ref 135–145)
TCO2: 25 mmol/L (ref 0–100)

## 2015-01-18 LAB — CBG MONITORING, ED
Glucose-Capillary: 54 mg/dL — ABNORMAL LOW (ref 65–99)
Glucose-Capillary: 53 mg/dL — ABNORMAL LOW (ref 65–99)
Glucose-Capillary: 136 mg/dL — ABNORMAL HIGH (ref 65–99)
Glucose-Capillary: 143 mg/dL — ABNORMAL HIGH (ref 65–99)
Glucose-Capillary: 101 mg/dL — ABNORMAL HIGH (ref 65–99)

## 2015-01-18 LAB — I-STAT TROPONIN, ED: Troponin i, poc: 0 ng/mL (ref 0.00–0.08)

## 2015-01-18 MED ORDER — LIDOCAINE HCL (PF) 1 % IJ SOLN: 5.0000 mL | INTRAMUSCULAR | Status: DC | PRN

## 2015-01-18 MED ORDER — PENTAFLUOROPROP-TETRAFLUOROETH EX AERO: 1.0000 "application " | INHALATION_SPRAY | CUTANEOUS | Status: DC | PRN

## 2015-01-18 MED ORDER — HEPARIN SODIUM (PORCINE) 1000 UNIT/ML DIALYSIS
1000.0000 [IU] | INTRAMUSCULAR | Status: DC | PRN
  Filled 2015-01-18: qty 1

## 2015-01-18 MED ORDER — SODIUM CHLORIDE 0.9 % IV SOLN: 100.0000 mL | INTRAVENOUS | Status: DC | PRN

## 2015-01-18 MED ORDER — DIPHENHYDRAMINE HCL 25 MG PO CAPS
25.0000 mg | ORAL_CAPSULE | Freq: Once | ORAL | Status: AC
  Administered 2015-01-18: 25 mg via ORAL
  Filled 2015-01-18: qty 1

## 2015-01-18 MED ORDER — DIPHENHYDRAMINE HCL 50 MG/ML IJ SOLN
25.0000 mg | Freq: Once | INTRAMUSCULAR | Status: AC
  Administered 2015-01-18: 25 mg via INTRAVENOUS
  Filled 2015-01-18: qty 1

## 2015-01-18 MED ORDER — DEXTROSE 50 % IV SOLN
1.0000 | Freq: Once | INTRAVENOUS | Status: AC
  Administered 2015-01-18: 50 mL via INTRAVENOUS
  Filled 2015-01-18: qty 50

## 2015-01-18 MED ORDER — LIDOCAINE-PRILOCAINE 2.5-2.5 % EX CREA: 1.0000 "application " | TOPICAL_CREAM | CUTANEOUS | Status: DC | PRN

## 2015-01-18 MED ORDER — HEPARIN SODIUM (PORCINE) 1000 UNIT/ML DIALYSIS
4000.0000 [IU] | INTRAMUSCULAR | Status: DC | PRN
  Administered 2015-01-19: 4000 [IU] via INTRAVENOUS_CENTRAL
  Filled 2015-01-18 (×2): qty 4

## 2015-01-18 MED ORDER — HYDROMORPHONE HCL 1 MG/ML IJ SOLN
1.0000 mg | Freq: Once | INTRAMUSCULAR | Status: AC
  Administered 2015-01-18: 1 mg via INTRAVENOUS
  Filled 2015-01-18: qty 1

## 2015-01-18 MED ORDER — ALTEPLASE 2 MG IJ SOLR
2.0000 mg | Freq: Once | INTRAMUSCULAR | Status: DC | PRN
  Filled 2015-01-18: qty 2

## 2015-01-18 NOTE — ED Notes (Signed)
Admitting doctor at the bedside 

## 2015-01-18 NOTE — ED Notes (Signed)
Pain "better"

## 2015-01-18 NOTE — ED Notes (Signed)
Pt given pain med

## 2015-01-18 NOTE — ED Notes (Signed)
Pt's O2 sitting was 98% pulse 84, after standing and walking 5 steps, pt O2 drops to 91% with labored breathibg and pulse went up to 91.

## 2015-01-18 NOTE — ED Notes (Signed)
Pt off unit with xray 

## 2015-01-18 NOTE — ED Notes (Signed)
Gave pt peanut butter and graham crackers and orange juice.

## 2015-01-18 NOTE — ED Notes (Signed)
Report called to floor

## 2015-01-18 NOTE — ED Notes (Signed)
The pt is c/o of  Back and rt leg.  Family at the bedside

## 2015-01-18 NOTE — ED Notes (Signed)
benadry given for a rash that is chronic

## 2015-01-18 NOTE — ED Notes (Signed)
Dr Jonnie Finner here to see

## 2015-01-18 NOTE — ED Notes (Signed)
Attempted report  Unable to give

## 2015-01-18 NOTE — Consult Note (Signed)
Renal Service Consult Note Talent 01/18/2015 Dale [name] D Requesting Physician: Dr. Colin Rhein  Reason for Consult:  ESRD patient w SOB, due for HD tomorrow HPI: The patient is a 50 y.o. year-old with hx of HTN, DM, obesity, diast HF, CAD w hx stent(s), OSA, and ESRD on HD MWF at [facility].  Presented to ED tonight with chest pain and SOB.  Says he has SOB even when sitting up in the chair.  Usually when he has SOB it's when lying flat. And he says he only has 3 kg on so shouldn't be "full of fluid".  Denies fevers, prod cough, abd pain, n/v/dl.  Last HD was Friday 2 days ago.     Chart review: 02/2014 - unstable angina and abnormal stress test > heart cath showed severe RCA stenosis, underwent balloon angioplasty w plan to bring back for rotablator atherectomy in 2-3 weeks. Also severe stenosis in mid-distal diagonal to be treated medically. Also ESRD, DM 03/2014 - rotablator atherectomy mid RCA and distal RCA and DES mid RCA 08/2014 - CP during OP HD, nausea, SOB> ruled out for MI, echo normal EF, no WMA, dc'd home 09/2014 - hypotension evening after HD > admitted, NS bolus, BP meds held. BP improved. Mild hypoxemia, CT angio chest was neg for PE.  Imdur dc'd. ECHO normal LVEF.    Past Medical History  Past Medical History  Diagnosis Date  . Hypertension   . Anemia of chronic disease   . Cholecystitis     a. 08/27/2011  . Constipation   . Pericardial effusion     a.  Small by CT 08/22/11;  b.  Large by CT 08/27/11  . Inguinal lymphadenopathy     a. bilateral - s/p biopsy 07/2011  . Fibromyalgia   . Chronic diastolic CHF (congestive heart failure)     a. Echo 3/13:  EF 55-60%;  b. Echo (11/15):  Mild LVH, EF 60-65%, no RWMA, Gr 1 DD, MAC, mild LAE, normal RVF, mild RAE;  c.  Echo 5/16:  severe LVH, EF 55-60%, no RWMA, Gr 1 DD, MAC, mild LAE  . HLD (hyperlipidemia)   . Hypothyroidism   . GERD (gastroesophageal reflux disease)   . CAD (coronary artery  disease)     a. Lexiscan Myoview (11/15):  Inf, apical cap and apical lateral ischemia, EF 56%, inf HK, Moderate Risk;  b. LHC (11/15):  mid to dist Dx 80%, mid RCA 99% (functional CTO), dist RCA 80% >> PCI: balloon angioplasty to mid RCA (could not deliver stent) - plan staged PCI with HSRA of RCA; Dx to be tx medically >> PCI: Rotoblator atherectomy/DES to [facility]  . OSA (obstructive sleep apnea)     adjustable bed  . Shortness of breath dyspnea     too much Fluid  . Pneumonia ~ 2007  . Insulin dependent diabetes mellitus     Type I  . Claustrophobia     when things get around his face.   [name] Kitchen ESRD (end stage renal disease)     a. 1995 s/p cadaveric transplant w/ susbequent failure after 18 yrs;  b.Dialysis initiated 07/2011 [facility]  . Constipation   . Arthritis     "hands, right knee" (03/19/2014)  . History of blood transfusion    Past Surgical History  Past Surgical History  Procedure Laterality Date  . Kidney transplant  04/25/1993  . Cataract extraction w/ intraocular lens  implant, bilateral Bilateral 1990's  . Arteriovenous  graft placement Left 1993?    forearm  . Arteriovenous graft placement Right 1993?    leg  . Toe amputation Bilateral after 2005  . Below knee leg amputation Left 2010  . Carpal tunnel release Bilateral after 2005  . Shoulder arthroscopy Left ~ 2006    frozen  . Av fistula placement  07/29/2011    Procedure: ARTERIOVENOUS (AV) FISTULA CREATION;  Surgeon: Mal Misty, MD;  Location: [place];  Service: Vascular;  Laterality: Right;  Brachial cephalic  . Insertion of dialysis catheter  08/01/2011    Procedure: INSERTION OF DIALYSIS CATHETER;  Surgeon: Rosetta Posner, MD;  Location: [place];  Service: Vascular;  Laterality: Right;  insertion of dialysis catheter on right internal jugular vein  . Lymph node biopsy  08/10/2011    Procedure: LYMPH NODE BIOPSY;  Surgeon: Merrie Roof, MD;  Location: [place];  Service: General;  Laterality: Left;  left groin lymph biopsy   . Colonoscopy  08/29/2011    Procedure: COLONOSCOPY;  Surgeon: Jerene Bears, MD;  Location: [place];  Service: Gastroenterology;  Laterality: N/A;  . Appendectomy  ~ 2006  . Below knee leg amputation Left   . Umbilical hernia repair  1990's  . Cardiac catheterization  03/19/2014  . Av graft placement Left 1993?    "attempted one in my wrist; didn't take"  . Neuroplasty / transposition ulnar nerve at elbow Left after 2005  . Venogram Right 07/28/2011    Procedure: VENOGRAM;  Surgeon: Conrad Milano, MD;  Location: [facility] CATH LAB;  Service: Cardiovascular;  Laterality: Right;  . Left heart catheterization with coronary angiogram N/A 03/19/2014    Procedure: LEFT HEART CATHETERIZATION WITH CORONARY ANGIOGRAM;  Surgeon: Burnell Blanks, MD;  Location: [facility] CATH LAB;  Service: Cardiovascular;  Laterality: N/A;  . Cardiac catheterization  03/19/2014    Procedure: CORONARY BALLOON ANGIOPLASTY;  Surgeon: Burnell Blanks, MD;  Location: [facility] CATH LAB;  Service: Cardiovascular;;  . Coronary angioplasty with stent placement  04/09/2014        ptca/des mid lad   . Angioplasty  04/09/2014    RCA   . Femoral artery repair  04/09/2014    ANGIOSEAL  . Percutaneous coronary rotoblator intervention (pci-r) N/A 04/09/2014    Procedure: PERCUTANEOUS CORONARY ROTOBLATOR INTERVENTION (PCI-R);  Surgeon: Burnell Blanks, MD;  Location: [facility] CATH LAB;  Service: Cardiovascular;  Laterality: N/A;  . Revison of arteriovenous fistula Right [id number]    Procedure: PLICATION / REVISION OF ARTERIOVENOUS FISTULA;  Surgeon: Angelia Mould, MD;  Location: [place];  Service: Vascular;  Laterality: Right;  . Peripheral vascular catheterization N/A 12/25/2014    Procedure: Upper Extremity Venography;  Surgeon: Conrad Lyndon Station, MD;  Location: [place] CV LAB;  Service: Cardiovascular;  Laterality: N/A;  . Carpal tunnel release Bilateral   . Eye surgery Bilateral     cataract  . Amputation of replicated toes  Right   . Vitrectomy Bilateral   . Peritoneal catheter insertion    . Peritoneal catheter removal    . Arteriovenous graft placement Left     Thigh  . Arteriovenous graft removed Left     Thigh   Family History  Family History  Problem Relation Age of Onset  . Malignant hyperthermia Neg Hx   . Heart attack Neg Hx   . Stroke Neg Hx   . Colon polyps Father   . Diabetes Father   . Cancer Father   . Hypertension  Father   . Breast cancer Maternal Aunt   . Cancer Maternal Aunt     Breast and Bone  . Hypertension Mother   . Diabetes Mother   . Hyperlipidemia Mother    Social History  reports that he has never smoked. He has never used smokeless tobacco. He reports that he drinks alcohol. He reports that he does not use illicit drugs. Allergies  Allergies  Allergen Reactions  . Morphine And Related Hives and Nausea Only  . Amoxicillin Other (See Comments)    Reaction unknown  . Ciprofloxacin Other (See Comments)    Reaction unknown  . Clindamycin/Lincomycin Other (See Comments)    Reaction unknown  . Codeine Other (See Comments)    Reaction unknown. But pt has tolerated hydrocodone in Vicodin and Norco in the past  . Contrast Media [Iodinated Diagnostic Agents] Rash  . Doxycycline Rash  . Hydrocodone Rash  . Levofloxacin Other (See Comments)    Reaction unknown  . Oxycodone Rash  . Plavix [Clopidogrel Bisulfate] Rash  . Rocephin [Ceftriaxone] Itching  . Vasotec Cough   Home medications Prior to Admission medications   Medication Sig Start Date End Date Taking? Authorizing Provider  allopurinol (ZYLOPRIM) 100 MG tablet Take 200 mg by mouth daily.  03/14/12  Yes Historical Provider, MD  aspirin 81 MG tablet Take 81 mg by mouth every evening.    Yes Historical Provider, MD  atorvastatin (LIPITOR) 10 MG tablet Take 10 mg by mouth every evening.    Yes Historical Provider, MD  calcium acetate (PHOSLO) 667 MG capsule Take 667-1,334 mg by mouth See admin instructions. Take  1,334 mg (2 capsules) with meals and 667 (1 capsule) with snacks 09/19/12  Yes Historical Provider, MD  diphenhydrAMINE (BENADRYL) 25 mg capsule Take 1-2 capsules (25-50 mg total) by mouth every 6 (six) hours as needed for itching. 04/11/14  Yes Rhonda G Barrett, PA-C  docusate sodium (COLACE) 100 MG capsule Take 200 mg by mouth 3 (three) times daily.    Yes Historical Provider, MD  EFFIENT 10 MG TABS tablet TAKE 1 TABLET BY MOUTH EVERY DAY 10/08/14  Yes Burnell Blanks, MD  furosemide (LASIX) 80 MG tablet Take 80 mg by mouth 2 (two) times daily.  11/25/14  Yes Historical Provider, MD  GLUCAGON EMERGENCY 1 MG injection Inject 1 mg into the muscle daily as needed (low blood sugar).  01/19/12  Yes Historical Provider, MD  hydrocortisone 2.5 % cream APPLY 2-3 TIMES DAILY AS NEEDED TO AFFECTED AREA(S). 10/30/14  Yes Historical Provider, MD  HYDROmorphone (DILAUDID) 2 MG tablet Take 1 tablet (2 mg total) by mouth 3 (three) times daily as needed for severe pain. 11/20/14  Yes Alvia Grove, PA-C  Insulin Human (INSULIN PUMP) 100 unit/ml SOLN Inject into the skin continuous. Humalog   Yes Historical Provider, MD  levothyroxine (SYNTHROID, LEVOTHROID) 100 MCG tablet Take 100 mcg by mouth daily before breakfast.   Yes Historical Provider, MD  loratadine (CLARITIN) 10 MG tablet Take 10 mg by mouth daily.   Yes Historical Provider, MD  methylPREDNISolone (MEDROL DOSEPAK) 4 MG TBPK tablet Take 4 mg by mouth 4 (four) times daily.    Yes Historical Provider, MD  metoCLOPramide (REGLAN) 5 MG tablet Take 5 mg by mouth 4 (four) times daily - after meals and at bedtime. 05/26/11  Yes Historical Provider, MD  metoprolol succinate (TOPROL-XL) 25 MG 24 hr tablet Take 0.5 tablets (12.5 mg total) by mouth as directed. Take 12.5 mg on Tu,  Th, Sat and Sun; HOLD on dialysis days M, W, F Patient taking differently: Take 12.5 mg by mouth daily.  11/27/14  Yes Liliane Shi, PA-C  multivitamin (RENA-VIT) TABS tablet Take 1 tablet  by mouth daily.   Yes Historical Provider, MD  nitroGLYCERIN (NITROSTAT) 0.4 MG SL tablet Place 0.4 mg under the tongue every 5 (five) minutes as needed for chest pain.    Yes [name], MD  promethazine (PHENERGAN) 25 MG tablet Take 1 tablet (25 mg total) by mouth every 6 (six) hours as needed for nausea or vomiting. For nausea 11/27/14  Yes Liliane Shi, PA-C  ranitidine (ZANTAC) 150 MG tablet Take 150 mg by mouth 2 (two) times daily. 11/01/14  Yes Historical Provider, MD  rOPINIRole (REQUIP) 2 MG tablet Take 2 mg by mouth daily with supper.    Yes Historical Provider, MD  sevelamer carbonate (RENVELA) 800 MG tablet Take 1,600 mg by mouth 3 (three) times daily with meals.   Yes Historical Provider, MD   Liver Function Tests  Recent Labs Lab 01/18/15 1657  AST 20  ALT 18  ALKPHOS 170*  BILITOT 0.6  PROT 7.2  ALBUMIN 3.4*   No results for input(s): LIPASE, AMYLASE in the last 168 hours. CBC  Recent Labs Lab 01/18/15 1554 01/18/15 1559  WBC  --  9.5  NEUTROABS  --  5.3  HGB 11.9* 10.7*  HCT 35.0* 34.1*  MCV  --  103.3*  PLT  --  [id number]   Basic Metabolic Panel  Recent Labs Lab 01/18/15 1554 01/18/15 1657  NA 139 138  K 4.9 4.8  CL 103 98*  CO2  --  27  GLUCOSE 89 81  BUN 56* 58*  CREATININE 8.30* 8.97*  CALCIUM  --  8.9    Filed Vitals:   01/18/15 1800 01/18/15 1815 01/18/15 1900 01/18/15 1915  BP: 181/73 174/72 179/69 167/70  Pulse: 83 82 81 81  Temp:      TempSrc:      Resp: 17 17 24 23   Height:      Weight:      SpO2: 97% 95% 95% 96%   Exam Obese WM not in sig distress, dyspneic w minimal exertion in the stretcher No rash, cyanosis or gangrene Sclera anicteric, throat clear +JVD Chest rales R base, L clear RRR no RMG abd markedly obese, distended, nontender , no HSM GU normal  RLE 1+ pitting edema pretib L BKA stump RUA AVF +bruit w diffuse RUE edema which is moderate Good hand perfusion, no signs of infection over aVF Neuro is alert, Ox     Assessment: 1. Dyspnea/ pulm edema - most likely vol excess and has prob lost lean body wt. Dry wt was recently lowered at pt's request as he was presenting with minimal fluid on.   2. ESRD on HD 3. CAD hx PCI 4. DM on insulin 5. HTN bp's up some 6. Dye allergy 7. HD access - is on sched for ligation of R arm AVF w cath placement this Tuesday, consider keeping him for procedure?    Plan- HD tonight and again tomorrow, prob need to lower dry wt  Kelly Splinter MD (pgr) [phone]    ([phone] 01/18/2015, 8:24 PM

## 2015-01-18 NOTE — ED Notes (Signed)
Pt reports SOB since yesterday, denies CP. He feels like he cant catch his breath. Pt receives dialysis on MWF and was last dialyzed Friday. RR e/u, skin warm dry intact.

## 2015-01-18 NOTE — ED Notes (Addendum)
Pt CBG obtained and read 53

## 2015-01-18 NOTE — ED Provider Notes (Signed)
CSN: [id number]     Arrival date & time 01/18/15  1528 History   First MD Initiated Contact with Patient 01/18/15 1551     Chief Complaint  Patient presents with  . Shortness of Breath   Dale [name] is a 49 y.o. male with a past medical history significant for ESRD on dialysis M/W/F, CHF, CAD, hypertension, and fibromyalgia who presents with shortness of breath. The patient reported that he began having shortness of breath yesterday afternoon and after taking a nap, it continued to worsen. The patient says he has had shortness of breath all night and all day today. The patient denies any chest pain and says that he is partially 3 kg above his normal dry weight. The patient says that he has not had any fevers chills cough, sputum production, abdominal pain, nausea, vomiting, constipation, diarrhea. The patient says he had a allergic reaction to contrast 2 weeks ago and has had a rash that blistered, peeled all over his body. The patient says he has had slightly worsened rash across his chest and back today with pruritus. The patient says his shortness of breath is much worse when he lays down. The patient denies any other complaints on arrival.    (Consider location/radiation/quality/duration/timing/severity/associated sxs/prior Treatment) Patient is a 49 y.o. male presenting with shortness of breath. The history is provided by the patient. No language interpreter was used.  Shortness of Breath Severity:  Severe Onset quality:  Gradual Duration:  2 days Timing:  Constant Progression:  Waxing and waning Chronicity:  Recurrent Worsened by:  Nothing tried Ineffective treatments:  None tried Associated symptoms: rash   Associated symptoms: no abdominal pain, no chest pain, no cough, no diaphoresis, no fever, no headaches, no sputum production, no syncope, no vomiting and no wheezing   Risk factors: obesity   Risk factors: no hx of PE/DVT     Past Medical History  Diagnosis Date  .  Hypertension   . Anemia of chronic disease   . Cholecystitis     a. 08/27/2011  . Constipation   . Pericardial effusion     a.  Small by CT 08/22/11;  b.  Large by CT 08/27/11  . Inguinal lymphadenopathy     a. bilateral - s/p biopsy 07/2011  . Fibromyalgia   . Chronic diastolic CHF (congestive heart failure)     a. Echo 3/13:  EF 55-60%;  b. Echo (11/15):  Mild LVH, EF 60-65%, no RWMA, Gr 1 DD, MAC, mild LAE, normal RVF, mild RAE;  c.  Echo 5/16:  severe LVH, EF 55-60%, no RWMA, Gr 1 DD, MAC, mild LAE  . HLD (hyperlipidemia)   . Hypothyroidism   . GERD (gastroesophageal reflux disease)   . CAD (coronary artery disease)     a. Lexiscan Myoview (11/15):  Inf, apical cap and apical lateral ischemia, EF 56%, inf HK, Moderate Risk;  b. LHC (11/15):  mid to dist Dx 80%, mid RCA 99% (functional CTO), dist RCA 80% >> PCI: balloon angioplasty to mid RCA (could not deliver stent) - plan staged PCI with HSRA of RCA; Dx to be tx medically >> PCI: Rotoblator atherectomy/DES to [facility]  . OSA (obstructive sleep apnea)     adjustable bed  . Shortness of breath dyspnea     too much Fluid  . Pneumonia ~ 2007  . Insulin dependent diabetes mellitus     Type I  . Claustrophobia     when things get around his face.   [name] Kitchen  ESRD (end stage renal disease)     a. 1995 s/p cadaveric transplant w/ susbequent failure after 18 yrs;  b.Dialysis initiated 07/2011 [facility]  . Constipation   . Arthritis     "hands, right knee" (03/19/2014)  . History of blood transfusion    Past Surgical History  Procedure Laterality Date  . Kidney transplant  04/25/1993  . Cataract extraction w/ intraocular lens  implant, bilateral Bilateral 1990's  . Arteriovenous graft placement Left 1993?    forearm  . Arteriovenous graft placement Right 1993?    leg  . Toe amputation Bilateral after 2005  . Below knee leg amputation Left 2010  . Carpal tunnel release Bilateral after 2005  . Shoulder arthroscopy Left ~ 2006    frozen  . Av  fistula placement  07/29/2011    Procedure: ARTERIOVENOUS (AV) FISTULA CREATION;  Surgeon: Mal Misty, MD;  Location: [place];  Service: Vascular;  Laterality: Right;  Brachial cephalic  . Insertion of dialysis catheter  08/01/2011    Procedure: INSERTION OF DIALYSIS CATHETER;  Surgeon: Rosetta Posner, MD;  Location: [place];  Service: Vascular;  Laterality: Right;  insertion of dialysis catheter on right internal jugular vein  . Lymph node biopsy  08/10/2011    Procedure: LYMPH NODE BIOPSY;  Surgeon: Merrie Roof, MD;  Location: [place];  Service: General;  Laterality: Left;  left groin lymph biopsy  . Colonoscopy  08/29/2011    Procedure: COLONOSCOPY;  Surgeon: Jerene Bears, MD;  Location: [place];  Service: Gastroenterology;  Laterality: N/A;  . Appendectomy  ~ 2006  . Below knee leg amputation Left   . Umbilical hernia repair  1990's  . Cardiac catheterization  03/19/2014  . Av graft placement Left 1993?    "attempted one in my wrist; didn't take"  . Neuroplasty / transposition ulnar nerve at elbow Left after 2005  . Venogram Right 07/28/2011    Procedure: VENOGRAM;  Surgeon: [place], MD;  [facility] C[facility] LAB;  Service: Cardiovascular;  Laterality: Right;  . Left heart catheterization with coronary angiogram N/A 03/19/2014    Procedure: LEFT HEART CATHETERIZATION WITH CORONARY ANGIOGRAM;  Surgeon: Burnell Blanks, MD;  Location: [facility] CATH LAB;  Service: Cardiovascular;  Laterality: N/A;  . Cardiac catheterization  03/19/2014    Procedure: CORONARY BALLOON ANGIOPLASTY;  Surgeon: Burnell Blanks, MD;  Location: [facility] CATH LAB;  Service: Cardiovascular;;  . Coronary angioplasty with stent placement  04/09/2014        ptca/des mid lad   . Angioplasty  04/09/2014    RCA   . Femoral artery repair  04/09/2014    ANGIOSEAL  . Percutaneous coronary rotoblator intervention (pci-r) N/A 04/09/2014    Procedure: PERCUTANEOUS CORONARY ROTOBLATOR INTERVENTION (PCI-R);  Surgeon:  Burnell Blanks, MD;  Location: [facility] CATH LAB;  Service: Cardiovascular;  Laterality: N/A;  . Revison of arteriovenous fistula Right [id number]    Procedure: PLICATION / REVISION OF ARTERIOVENOUS FISTULA;  Surgeon: Angelia Mould, MD;  Location: [place];  Service: Vascular;  Laterality: Right;  . Peripheral vascular catheterization N/A 12/25/2014    Procedure: Upper Extremity Venography;  Surgeon: [place], MD;  Location: [place] CV LAB;  Service: Cardiovascular;  Laterality: N/A;  . Carpal tunnel release Bilateral   . Eye surgery Bilateral     cataract  . Amputation of replicated toes Right   . Vitrectomy Bilateral   . Peritoneal catheter insertion    . Peritoneal  catheter removal    . Arteriovenous graft placement Left     Thigh  . Arteriovenous graft removed Left     Thigh   Family History  Problem Relation Age of Onset  . Malignant hyperthermia Neg Hx   . Heart attack Neg Hx   . Stroke Neg Hx   . Colon polyps Father   . Diabetes Father   . Cancer Father   . Hypertension Father   . Breast cancer Maternal Aunt   . Cancer Maternal Aunt     Breast and Bone  . Hypertension Mother   . Diabetes Mother   . Hyperlipidemia Mother    Social History  Substance Use Topics  . Smoking status: Never Smoker   . Smokeless tobacco: Never Used  . Alcohol Use: Yes     Comment: 11/19/14 "might have a drink 2-3 times/year"    Review of Systems  Constitutional: Negative for fever, chills and diaphoresis.  HENT: Negative for congestion and rhinorrhea.   Respiratory: Positive for shortness of breath. Negative for cough, sputum production, chest tightness, wheezing and stridor.   Cardiovascular: Negative for chest pain, palpitations and syncope.  Gastrointestinal: Negative for nausea, vomiting, abdominal pain and constipation.  Musculoskeletal: Negative for back pain.  Skin: Positive for rash. Negative for wound.  Neurological: Negative for speech difficulty, light-headedness  and headaches.  Psychiatric/Behavioral: Negative for confusion and agitation.  All other systems reviewed and are negative.     Allergies  Doxycycline; Hydrocodone; Morphine and related; Amoxicillin; Ciprofloxacin; Clindamycin/lincomycin; Codeine; Levofloxacin; Rocephin; Vasotec; Contrast media; Oxycodone; and Plavix  Home Medications   Prior to Admission medications   Medication Sig Start Date End Date Taking? Authorizing Provider  allopurinol (ZYLOPRIM) 100 MG tablet Take 200 mg by mouth daily.  03/14/12   Historical Provider, MD  aspirin 81 MG tablet Take 81 mg by mouth every evening.     Historical Provider, MD  atorvastatin (LIPITOR) 10 MG tablet Take 10 mg by mouth every evening.     Historical Provider, MD  calcium acetate (PHOSLO) 667 MG capsule Take 667-1,334 mg by mouth See admin instructions. Take 1,334 mg (2 capsules) with meals and 667 (1 capsule) with snacks 09/19/12   Historical Provider, MD  diphenhydrAMINE (BENADRYL) 25 mg capsule Take 1-2 capsules (25-50 mg total) by mouth every 6 (six) hours as needed for itching. 04/11/14   Rhonda G Barrett, PA-C  diphenhydrAMINE (BENADRYL) 50 MG tablet Take 50 mg by mouth once.    Historical Provider, MD  docusate sodium (COLACE) 100 MG capsule Take 200 mg by mouth 3 (three) times daily.     Historical Provider, MD  EFFIENT 10 MG TABS tablet TAKE 1 TABLET BY MOUTH EVERY DAY 10/08/14   Burnell Blanks, MD  furosemide (LASIX) 80 MG tablet Take 80 mg by mouth 2 (two) times daily.  11/25/14   Historical Provider, MD  GLUCAGON EMERGENCY 1 MG injection Inject 1 mg into the muscle daily as needed (low blood sugar).  01/19/12   Historical Provider, MD  hydrocortisone 2.5 % cream APPLY 2-3 TIMES DAILY TO AFFECTED AREA(S). 10/30/14   Historical Provider, MD  HYDROmorphone (DILAUDID) 2 MG tablet Take 1 tablet (2 mg total) by mouth 3 (three) times daily as needed for severe pain. 11/20/14   Alvia Grove, PA-C  Insulin Human (INSULIN PUMP) 100  unit/ml SOLN Inject into the skin continuous. Humalog    Historical Provider, MD  levothyroxine (SYNTHROID, LEVOTHROID) 100 MCG tablet Take 100 mcg by mouth  daily before breakfast.    Historical Provider, MD  loratadine (CLARITIN) 10 MG tablet Take 10 mg by mouth daily.    Historical Provider, MD  methylPREDNISolone (MEDROL DOSEPAK) 4 MG TBPK tablet Take 4 mg by mouth.    Historical Provider, MD  metoCLOPramide (REGLAN) 5 MG tablet Take 5 mg by mouth 4 (four) times daily - after meals and at bedtime. 05/26/11   Historical Provider, MD  metoprolol succinate (TOPROL-XL) 25 MG 24 hr tablet Take 0.5 tablets (12.5 mg total) by mouth as directed. Take 12.5 mg on Tu, Th, Sat and Sun; HOLD on dialysis days M, W, F 11/27/14   Liliane Shi, PA-C  multivitamin (RENA-VIT) TABS tablet Take 1 tablet by mouth daily.    Historical Provider, MD  nitroGLYCERIN (NITROSTAT) 0.4 MG SL tablet Place 0.4 mg under the tongue every 5 (five) minutes as needed for chest pain.     [name], MD  predniSONE (DELTASONE) 50 MG tablet Take 50 mg by mouth once. 12/25/14   Historical Provider, MD  promethazine (PHENERGAN) 25 MG tablet Take 1 tablet (25 mg total) by mouth every 6 (six) hours as needed for nausea or vomiting. For nausea 11/27/14   Liliane Shi, PA-C  ranitidine (ZANTAC) 150 MG tablet Take 150 mg by mouth 2 (two) times daily. 11/01/14   Historical Provider, MD  rOPINIRole (REQUIP) 2 MG tablet Take 2 mg by mouth daily with supper.     Historical Provider, MD  sevelamer carbonate (RENVELA) 800 MG tablet Take 1,600 mg by mouth 3 (three) times daily with meals.    Historical Provider, MD   BP 169/77 mmHg  Temp(Src) 98.6 F (37 C) (Oral)  Resp 18  Ht 5\' 8"  (1.727 m)  Wt 280 lb (127.007 kg)  BMI 42.58 kg/m2  SpO2 96% Physical Exam  Constitutional: He is oriented to person, place, and time. He appears well-developed and well-nourished. No distress.  HENT:  Head: Normocephalic.  Mouth/Throat: No oropharyngeal  exudate.  Eyes: Conjunctivae are normal. Pupils are equal, round, and reactive to light.  Neck: Normal range of motion.  Cardiovascular: Normal heart sounds.   No murmur heard. Pulmonary/Chest: Effort normal. No stridor. He has rales. He exhibits no tenderness.  Patient had extreme dyspnea when laid flat.  Abdominal: Soft. He exhibits no distension. There is no tenderness. There is no rebound.  Neurological: He is alert and oriented to person, place, and time. He exhibits normal muscle tone.  Skin: Skin is warm. Rash noted. He is not diaphoretic.  Psychiatric: He has a normal mood and affect.  Nursing note and vitals reviewed.   ED Course  Procedures (including critical care time) Labs Review Labs Reviewed  CBC WITH DIFFERENTIAL/PLATELET - Abnormal; Notable for the following:    RBC 3.30 (*)    Hemoglobin 10.7 (*)    HCT 34.1 (*)    MCV 103.3 (*)    RDW 16.6 (*)    Eosinophils Absolute 1.4 (*)    All other components within normal limits  COMPREHENSIVE METABOLIC PANEL - Abnormal; Notable for the following:    Chloride 98 (*)    BUN 58 (*)    Creatinine, Ser 8.97 (*)    Albumin 3.4 (*)    Alkaline Phosphatase 170 (*)    GFR calc non Af Amer 6 (*)    GFR calc Af Amer 7 (*)    All other components within normal limits  I-STAT CHEM 8, ED - Abnormal; Notable for the  following:    BUN 56 (*)    Creatinine, Ser 8.30 (*)    Calcium, Ion 1.10 (*)    Hemoglobin 11.9 (*)    HCT 35.0 (*)    All other components within normal limits  CBG MONITORING, ED - Abnormal; Notable for the following:    Glucose-Capillary 54 (*)    All other components within normal limits  CBG MONITORING, ED - Abnormal; Notable for the following:    Glucose-Capillary 53 (*)    All other components within normal limits  CBG MONITORING, ED - Abnormal; Notable for the following:    Glucose-Capillary 136 (*)    All other components within normal limits  CBG MONITORING, ED - Abnormal; Notable for the  following:    Glucose-Capillary 143 (*)    All other components within normal limits  I-STAT TROPOININ, ED    Imaging Review Dg Chest 2 View  01/18/2015   CLINICAL DATA:  Dyspnea since 01/17/2015.  EXAM: CHEST  2 VIEW  COMPARISON:  PA and lateral chest 09/30/2014 and 05/13/2014. CT chest 10/02/2014.  FINDINGS: There is cardiomegaly and interstitial pulmonary edema. No consolidative process, pneumothorax or effusion is identified.  IMPRESSION: Cardiomegaly and interstitial edema.   Electronically Signed   By: Inge Rise M.D.   On: 01/18/2015 16:19   I have personally reviewed and evaluated these images and lab results as part of my medical decision-making.   EKG Interpretation   Date/Time:  Sunday January 18 2015 15:36:44 EDT Ventricular Rate:  78 PR Interval:  138 QRS Duration: 84 QT Interval:  404 QTC Calculation: 460 R Axis:   85 Text Interpretation:  Normal sinus rhythm Normal ECG No significant change  since last tracing Confirmed by Debby Freiberg [phone]) on 01/18/2015  4:20:51 PM      MDM   Dale [name] is a 49 y.o. male with a past medical history significant for ESRD on dialysis M/W/F, CHF, CAD, hypertension, and fibromyalgia who presents with shortness of breath. Initial concern given the patient's story is fluid overload. The patient denies any chest pain but does report his chronic back and leg pain. The patient reports that he has been compliant with dialysis and is several kilograms above his dry weight however, on exam patient's lungs sounded wet, his chest x-ray showed interstitial edema, and the patient had severe shortness of breath when laid flat. The patient had his pulse ox monitored while he tried to walk in the ED and it went from 100% down to 91% and the patient became symptomatic with shortness of breath. The patient is normally ambulatory.  [name] laboratory testing results are seen above. As expected, the patient's creatinine was elevated to  8.97 and his BUN was 58. The patient did not have evidence of leukocytosis or hyperkalemia. [name] initial glucose was 54 and after juice, it remained low. The patient was then given D50 improving to 136. The patient had an EKG which did not show evidence of ischemia and the patient's initial troponin was negative. The patient continued to deny chest pain and 80.  Given the patient's clinical suspicion for fluid overload associated dyspnea, the nephrology team will be called for management regulations. As the patient denies fevers, chills, sputum production and his x-ray did not show pneumonia, pneumonia is felt unlikely.   The nephrology team report that they will dialyze the patient overnight and the patient should be admitted to the internal medicine service for further symptomatic management. The patient was  called to the hospitalist service and they'll admit him for further management. The patient was admitted in stable condition with no other problems.  This patient was seen with Dr. Colin Rhein, emergency medicine attending.   Final diagnoses:  Acute dyspnea       Antony Blackbird, MD 01/19/15 [id number]  Debby Freiberg, MD 01/24/15 [phone]

## 2015-01-19 ENCOUNTER — Telehealth: Payer: Self-pay

## 2015-01-19 ENCOUNTER — Encounter (HOSPITAL_COMMUNITY): Payer: Self-pay

## 2015-01-19 DIAGNOSIS — Z6841 Body Mass Index (BMI) 40.0 and over, adult: Secondary | ICD-10-CM | POA: Diagnosis not present

## 2015-01-19 DIAGNOSIS — T508X5A Adverse effect of diagnostic agents, initial encounter: Secondary | ICD-10-CM | POA: Diagnosis present

## 2015-01-19 DIAGNOSIS — J9601 Acute respiratory failure with hypoxia: Secondary | ICD-10-CM | POA: Diagnosis not present

## 2015-01-19 DIAGNOSIS — Z992 Dependence on renal dialysis: Secondary | ICD-10-CM | POA: Diagnosis not present

## 2015-01-19 DIAGNOSIS — I25110 Atherosclerotic heart disease of native coronary artery with unstable angina pectoris: Secondary | ICD-10-CM | POA: Diagnosis present

## 2015-01-19 DIAGNOSIS — R0600 Dyspnea, unspecified: Secondary | ICD-10-CM

## 2015-01-19 DIAGNOSIS — Z961 Presence of intraocular lens: Secondary | ICD-10-CM | POA: Diagnosis present

## 2015-01-19 DIAGNOSIS — I132 Hypertensive heart and chronic kidney disease with heart failure and with stage 5 chronic kidney disease, or end stage renal disease: Secondary | ICD-10-CM | POA: Diagnosis present

## 2015-01-19 DIAGNOSIS — I120 Hypertensive chronic kidney disease with stage 5 chronic kidney disease or end stage renal disease: Secondary | ICD-10-CM | POA: Diagnosis present

## 2015-01-19 DIAGNOSIS — E785 Hyperlipidemia, unspecified: Secondary | ICD-10-CM | POA: Diagnosis present

## 2015-01-19 DIAGNOSIS — I10 Essential (primary) hypertension: Secondary | ICD-10-CM | POA: Diagnosis not present

## 2015-01-19 DIAGNOSIS — Z7952 Long term (current) use of systemic steroids: Secondary | ICD-10-CM | POA: Diagnosis not present

## 2015-01-19 DIAGNOSIS — N185 Chronic kidney disease, stage 5: Secondary | ICD-10-CM | POA: Diagnosis not present

## 2015-01-19 DIAGNOSIS — E1122 Type 2 diabetes mellitus with diabetic chronic kidney disease: Secondary | ICD-10-CM | POA: Diagnosis present

## 2015-01-19 DIAGNOSIS — L299 Pruritus, unspecified: Secondary | ICD-10-CM | POA: Diagnosis present

## 2015-01-19 DIAGNOSIS — N2581 Secondary hyperparathyroidism of renal origin: Secondary | ICD-10-CM | POA: Diagnosis present

## 2015-01-19 DIAGNOSIS — Y828 Other medical devices associated with adverse incidents: Secondary | ICD-10-CM | POA: Diagnosis present

## 2015-01-19 DIAGNOSIS — D638 Anemia in other chronic diseases classified elsewhere: Secondary | ICD-10-CM | POA: Diagnosis present

## 2015-01-19 DIAGNOSIS — J810 Acute pulmonary edema: Secondary | ICD-10-CM | POA: Diagnosis not present

## 2015-01-19 DIAGNOSIS — I871 Compression of vein: Secondary | ICD-10-CM | POA: Diagnosis present

## 2015-01-19 DIAGNOSIS — E8779 Other fluid overload: Secondary | ICD-10-CM | POA: Diagnosis present

## 2015-01-19 DIAGNOSIS — Z833 Family history of diabetes mellitus: Secondary | ICD-10-CM | POA: Diagnosis not present

## 2015-01-19 DIAGNOSIS — R0602 Shortness of breath: Secondary | ICD-10-CM | POA: Diagnosis present

## 2015-01-19 DIAGNOSIS — Z940 Kidney transplant status: Secondary | ICD-10-CM | POA: Diagnosis not present

## 2015-01-19 DIAGNOSIS — G8929 Other chronic pain: Secondary | ICD-10-CM | POA: Diagnosis present

## 2015-01-19 DIAGNOSIS — Z955 Presence of coronary angioplasty implant and graft: Secondary | ICD-10-CM | POA: Diagnosis not present

## 2015-01-19 DIAGNOSIS — Z89512 Acquired absence of left leg below knee: Secondary | ICD-10-CM | POA: Diagnosis not present

## 2015-01-19 DIAGNOSIS — Z7982 Long term (current) use of aspirin: Secondary | ICD-10-CM | POA: Diagnosis not present

## 2015-01-19 DIAGNOSIS — T82898A Other specified complication of vascular prosthetic devices, implants and grafts, initial encounter: Secondary | ICD-10-CM | POA: Diagnosis present

## 2015-01-19 DIAGNOSIS — E8770 Fluid overload, unspecified: Secondary | ICD-10-CM | POA: Diagnosis not present

## 2015-01-19 DIAGNOSIS — Z8371 Family history of colonic polyps: Secondary | ICD-10-CM | POA: Diagnosis not present

## 2015-01-19 DIAGNOSIS — D649 Anemia, unspecified: Secondary | ICD-10-CM | POA: Diagnosis not present

## 2015-01-19 DIAGNOSIS — N186 End stage renal disease: Secondary | ICD-10-CM | POA: Diagnosis present

## 2015-01-19 DIAGNOSIS — G4733 Obstructive sleep apnea (adult) (pediatric): Secondary | ICD-10-CM | POA: Diagnosis present

## 2015-01-19 DIAGNOSIS — Z794 Long term (current) use of insulin: Secondary | ICD-10-CM | POA: Diagnosis not present

## 2015-01-19 DIAGNOSIS — Z9641 Presence of insulin pump (external) (internal): Secondary | ICD-10-CM | POA: Diagnosis not present

## 2015-01-19 DIAGNOSIS — E039 Hypothyroidism, unspecified: Secondary | ICD-10-CM | POA: Diagnosis present

## 2015-01-19 DIAGNOSIS — Z8249 Family history of ischemic heart disease and other diseases of the circulatory system: Secondary | ICD-10-CM | POA: Diagnosis not present

## 2015-01-19 DIAGNOSIS — M797 Fibromyalgia: Secondary | ICD-10-CM | POA: Diagnosis present

## 2015-01-19 DIAGNOSIS — Z91041 Radiographic dye allergy status: Secondary | ICD-10-CM | POA: Diagnosis not present

## 2015-01-19 DIAGNOSIS — T50995A Adverse effect of other drugs, medicaments and biological substances, initial encounter: Secondary | ICD-10-CM | POA: Diagnosis not present

## 2015-01-19 DIAGNOSIS — K219 Gastro-esophageal reflux disease without esophagitis: Secondary | ICD-10-CM | POA: Diagnosis present

## 2015-01-19 DIAGNOSIS — Z7902 Long term (current) use of antithrombotics/antiplatelets: Secondary | ICD-10-CM | POA: Diagnosis not present

## 2015-01-19 DIAGNOSIS — M1990 Unspecified osteoarthritis, unspecified site: Secondary | ICD-10-CM | POA: Diagnosis present

## 2015-01-19 DIAGNOSIS — I5032 Chronic diastolic (congestive) heart failure: Secondary | ICD-10-CM | POA: Diagnosis present

## 2015-01-19 DIAGNOSIS — Z803 Family history of malignant neoplasm of breast: Secondary | ICD-10-CM | POA: Diagnosis not present

## 2015-01-19 LAB — CBC WITH DIFFERENTIAL/PLATELET
Basophils Absolute: 0.1 10*3/uL (ref 0.0–0.1)
Basophils Relative: 1 %
Eosinophils Absolute: 1.1 10*3/uL — ABNORMAL HIGH (ref 0.0–0.7)
Eosinophils Relative: 12 %
HCT: 32.2 % — ABNORMAL LOW (ref 39.0–52.0)
Hemoglobin: 10.1 g/dL — ABNORMAL LOW (ref 13.0–17.0)
Lymphocytes Relative: 18 %
Lymphs Abs: 1.6 10*3/uL (ref 0.7–4.0)
MCH: 32.1 pg (ref 26.0–34.0)
MCHC: 31.4 g/dL (ref 30.0–36.0)
MCV: 102.2 fL — ABNORMAL HIGH (ref 78.0–100.0)
Monocytes Absolute: 1.2 10*3/uL — ABNORMAL HIGH (ref 0.1–1.0)
Monocytes Relative: 13 %
Neutro Abs: 5.3 10*3/uL (ref 1.7–7.7)
Neutrophils Relative %: 56 %
Platelets: 205 10*3/uL (ref 150–400)
RBC: 3.15 MIL/uL — ABNORMAL LOW (ref 4.22–5.81)
RDW: 16.9 % — ABNORMAL HIGH (ref 11.5–15.5)
WBC: 9.3 10*3/uL (ref 4.0–10.5)

## 2015-01-19 LAB — COMPREHENSIVE METABOLIC PANEL
ALT: 18 U/L (ref 17–63)
AST: 16 U/L (ref 15–41)
Albumin: 3.2 g/dL — ABNORMAL LOW (ref 3.5–5.0)
Alkaline Phosphatase: 156 U/L — ABNORMAL HIGH (ref 38–126)
Anion gap: 13 (ref 5–15)
BUN: 62 mg/dL — ABNORMAL HIGH (ref 6–20)
CO2: 25 mmol/L (ref 22–32)
Calcium: 8.4 mg/dL — ABNORMAL LOW (ref 8.9–10.3)
Chloride: 99 mmol/L — ABNORMAL LOW (ref 101–111)
Creatinine, Ser: 9.66 mg/dL — ABNORMAL HIGH (ref 0.61–1.24)
GFR calc Af Amer: 6 mL/min — ABNORMAL LOW (ref >60)
GFR calc non Af Amer: 6 mL/min — ABNORMAL LOW (ref >60)
Glucose, Bld: 252 mg/dL — ABNORMAL HIGH (ref 65–99)
Potassium: 5.6 mmol/L — ABNORMAL HIGH (ref 3.5–5.1)
Sodium: 137 mmol/L (ref 135–145)
Total Bilirubin: 0.6 mg/dL (ref 0.3–1.2)
Total Protein: 7.1 g/dL (ref 6.5–8.1)

## 2015-01-19 LAB — GLUCOSE, CAPILLARY
Glucose-Capillary: 50 mg/dL — ABNORMAL LOW (ref 65–99)
Glucose-Capillary: 48 mg/dL — ABNORMAL LOW (ref 65–99)
Glucose-Capillary: 56 mg/dL — ABNORMAL LOW (ref 65–99)
Glucose-Capillary: 179 mg/dL — ABNORMAL HIGH (ref 65–99)
Glucose-Capillary: 202 mg/dL — ABNORMAL HIGH (ref 65–99)
Glucose-Capillary: 100 mg/dL — ABNORMAL HIGH (ref 65–99)
Glucose-Capillary: 301 mg/dL — ABNORMAL HIGH (ref 65–99)
Glucose-Capillary: 165 mg/dL — ABNORMAL HIGH (ref 65–99)
Glucose-Capillary: 100 mg/dL — ABNORMAL HIGH (ref 65–99)

## 2015-01-19 MED ORDER — HYDROMORPHONE HCL 1 MG/ML IJ SOLN
1.0000 mg | INTRAMUSCULAR | Status: DC | PRN
  Administered 2015-01-20 (×2): 1 mg via INTRAVENOUS
  Filled 2015-01-19 (×3): qty 1

## 2015-01-19 MED ORDER — SODIUM CHLORIDE 0.9 % IV SOLN: 100.0000 mL | INTRAVENOUS | Status: DC | PRN

## 2015-01-19 MED ORDER — ROPINIROLE HCL 1 MG PO TABS
2.0000 mg | ORAL_TABLET | Freq: Every day | ORAL | Status: DC
  Administered 2015-01-19 – 2015-01-21 (×3): 2 mg via ORAL
  Filled 2015-01-19 (×5): qty 2

## 2015-01-19 MED ORDER — METHYLPREDNISOLONE 4 MG PO TBPK
4.0000 mg | ORAL_TABLET | Freq: Four times a day (QID) | ORAL | Status: DC
  Filled 2015-01-19: qty 21

## 2015-01-19 MED ORDER — CHLORHEXIDINE GLUCONATE CLOTH 2 % EX PADS: 6.0000 | MEDICATED_PAD | Freq: Once | CUTANEOUS | Status: DC

## 2015-01-19 MED ORDER — PRASUGREL HCL 10 MG PO TABS
10.0000 mg | ORAL_TABLET | Freq: Every day | ORAL | Status: DC
  Administered 2015-01-19 – 2015-01-21 (×2): 10 mg via ORAL
  Filled 2015-01-19 (×5): qty 1

## 2015-01-19 MED ORDER — NITROGLYCERIN 0.4 MG SL SUBL: 0.4000 mg | SUBLINGUAL_TABLET | SUBLINGUAL | Status: DC | PRN

## 2015-01-19 MED ORDER — DEXTROSE 50 % IV SOLN
1.0000 | Freq: Once | INTRAVENOUS | Status: AC
  Administered 2015-01-19: 50 mL via INTRAVENOUS

## 2015-01-19 MED ORDER — HYDROMORPHONE HCL 1 MG/ML IJ SOLN
1.0000 mg | INTRAMUSCULAR | Status: DC | PRN
  Administered 2015-01-19 – 2015-01-22 (×16): 1 mg via INTRAVENOUS
  Filled 2015-01-19 (×12): qty 1

## 2015-01-19 MED ORDER — PROMETHAZINE HCL 25 MG PO TABS: 25.0000 mg | ORAL_TABLET | Freq: Four times a day (QID) | ORAL | Status: DC | PRN

## 2015-01-19 MED ORDER — SODIUM CHLORIDE 0.9 % IV SOLN: 250.0000 mL | INTRAVENOUS | Status: DC | PRN

## 2015-01-19 MED ORDER — METOPROLOL SUCCINATE ER 25 MG PO TB24: 12.5000 mg | ORAL_TABLET | Freq: Every day | ORAL | Status: DC

## 2015-01-19 MED ORDER — FUROSEMIDE 80 MG PO TABS
80.0000 mg | ORAL_TABLET | Freq: Two times a day (BID) | ORAL | Status: DC
  Administered 2015-01-19 – 2015-01-22 (×6): 80 mg via ORAL
  Filled 2015-01-19 (×6): qty 1

## 2015-01-19 MED ORDER — SODIUM CHLORIDE 0.9 % IJ SOLN
3.0000 mL | Freq: Two times a day (BID) | INTRAMUSCULAR | Status: DC
  Administered 2015-01-20 – 2015-01-21 (×2): 3 mL via INTRAVENOUS

## 2015-01-19 MED ORDER — DIPHENHYDRAMINE HCL 50 MG/ML IJ SOLN
25.0000 mg | Freq: Four times a day (QID) | INTRAMUSCULAR | Status: DC | PRN
  Administered 2015-01-19: 25 mg via INTRAVENOUS

## 2015-01-19 MED ORDER — DOCUSATE SODIUM 100 MG PO CAPS
200.0000 mg | ORAL_CAPSULE | Freq: Three times a day (TID) | ORAL | Status: DC
Start: 2015-01-19 — End: 2015-01-22
  Administered 2015-01-19 – 2015-01-22 (×9): 200 mg via ORAL
  Filled 2015-01-19 (×9): qty 2

## 2015-01-19 MED ORDER — SODIUM CHLORIDE 0.9 % IJ SOLN
3.0000 mL | Freq: Two times a day (BID) | INTRAMUSCULAR | Status: DC
  Administered 2015-01-19 – 2015-01-21 (×5): 3 mL via INTRAVENOUS

## 2015-01-19 MED ORDER — NEPRO/CARBSTEADY PO LIQD
237.0000 mL | Freq: Two times a day (BID) | ORAL | Status: DC
  Administered 2015-01-21 (×2): 237 mL via ORAL

## 2015-01-19 MED ORDER — METOPROLOL SUCCINATE ER 25 MG PO TB24
12.5000 mg | ORAL_TABLET | ORAL | Status: DC
  Filled 2015-01-19: qty 1

## 2015-01-19 MED ORDER — METHYLPREDNISOLONE 4 MG PO TABS
4.0000 mg | ORAL_TABLET | Freq: Three times a day (TID) | ORAL | Status: AC
  Administered 2015-01-19 (×3): 4 mg via ORAL
  Filled 2015-01-19 (×6): qty 1

## 2015-01-19 MED ORDER — INSULIN PUMP
SUBCUTANEOUS | Status: DC
  Administered 2015-01-19: 10 via SUBCUTANEOUS
  Administered 2015-01-19: 12 via SUBCUTANEOUS
  Administered 2015-01-20 – 2015-01-21 (×2): via SUBCUTANEOUS
  Administered 2015-01-21: 8.5 via SUBCUTANEOUS
  Administered 2015-01-21: 20:00:00 via SUBCUTANEOUS
  Administered 2015-01-21: 8.8 via SUBCUTANEOUS
  Administered 2015-01-22 (×2): via SUBCUTANEOUS
  Filled 2015-01-19: qty 1

## 2015-01-19 MED ORDER — RENA-VITE PO TABS
1.0000 | ORAL_TABLET | Freq: Every day | ORAL | Status: DC
  Administered 2015-01-19 – 2015-01-21 (×3): 1 via ORAL
  Filled 2015-01-19 (×3): qty 1

## 2015-01-19 MED ORDER — ALLOPURINOL 100 MG PO TABS
200.0000 mg | ORAL_TABLET | Freq: Every day | ORAL | Status: DC
  Administered 2015-01-19 – 2015-01-22 (×3): 200 mg via ORAL
  Filled 2015-01-19 (×4): qty 2

## 2015-01-19 MED ORDER — VANCOMYCIN HCL IN DEXTROSE 1-5 GM/200ML-% IV SOLN
1000.0000 mg | INTRAVENOUS | Status: AC
  Administered 2015-01-20: 1000 mg via INTRAVENOUS
  Filled 2015-01-19 (×2): qty 200

## 2015-01-19 MED ORDER — ONDANSETRON HCL 4 MG PO TABS: 4.0000 mg | ORAL_TABLET | Freq: Four times a day (QID) | ORAL | Status: DC | PRN

## 2015-01-19 MED ORDER — SODIUM CHLORIDE 0.9 % IJ SOLN
3.0000 mL | INTRAMUSCULAR | Status: DC | PRN
  Administered 2015-01-19: 3 mL via INTRAVENOUS
  Filled 2015-01-19: qty 3

## 2015-01-19 MED ORDER — DIPHENHYDRAMINE HCL 50 MG/ML IJ SOLN
25.0000 mg | INTRAMUSCULAR | Status: DC | PRN
  Administered 2015-01-19 – 2015-01-22 (×18): 25 mg via INTRAVENOUS
  Filled 2015-01-19 (×16): qty 1

## 2015-01-19 MED ORDER — DIPHENHYDRAMINE HCL 50 MG/ML IJ SOLN
INTRAMUSCULAR | Status: AC
  Filled 2015-01-19: qty 1

## 2015-01-19 MED ORDER — CALCIUM ACETATE (PHOS BINDER) 667 MG PO CAPS
667.0000 mg | ORAL_CAPSULE | ORAL | Status: DC | PRN
  Filled 2015-01-19: qty 1

## 2015-01-19 MED ORDER — METHYLPREDNISOLONE 4 MG PO TABS
4.0000 mg | ORAL_TABLET | Freq: Two times a day (BID) | ORAL | Status: AC
  Administered 2015-01-20: 4 mg via ORAL
  Filled 2015-01-19 (×3): qty 1

## 2015-01-19 MED ORDER — LIDOCAINE-PRILOCAINE 2.5-2.5 % EX CREA: 1.0000 "application " | TOPICAL_CREAM | CUTANEOUS | Status: DC | PRN

## 2015-01-19 MED ORDER — INSULIN PUMP: SUBCUTANEOUS | Status: DC

## 2015-01-19 MED ORDER — HEPARIN SODIUM (PORCINE) 1000 UNIT/ML DIALYSIS: 4000.0000 [IU] | INTRAMUSCULAR | Status: DC | PRN

## 2015-01-19 MED ORDER — METHYLPREDNISOLONE 4 MG PO TABS
4.0000 mg | ORAL_TABLET | Freq: Every day | ORAL | Status: AC
  Filled 2015-01-19: qty 1

## 2015-01-19 MED ORDER — LEVOTHYROXINE SODIUM 100 MCG PO TABS
100.0000 ug | ORAL_TABLET | Freq: Every day | ORAL | Status: DC
  Administered 2015-01-19 – 2015-01-22 (×4): 100 ug via ORAL
  Filled 2015-01-19 (×4): qty 1

## 2015-01-19 MED ORDER — LORATADINE 10 MG PO TABS
10.0000 mg | ORAL_TABLET | Freq: Every day | ORAL | Status: DC
  Administered 2015-01-19 – 2015-01-22 (×3): 10 mg via ORAL
  Filled 2015-01-19 (×3): qty 1

## 2015-01-19 MED ORDER — HYDROCORTISONE 2.5 % RE CREA
TOPICAL_CREAM | Freq: Three times a day (TID) | RECTAL | Status: DC
  Filled 2015-01-19: qty 28.35

## 2015-01-19 MED ORDER — HEPARIN SODIUM (PORCINE) 1000 UNIT/ML DIALYSIS: 1000.0000 [IU] | INTRAMUSCULAR | Status: DC | PRN

## 2015-01-19 MED ORDER — DEXTROSE 50 % IV SOLN
INTRAVENOUS | Status: AC
  Filled 2015-01-19: qty 50

## 2015-01-19 MED ORDER — ONDANSETRON HCL 4 MG/2ML IJ SOLN: 4.0000 mg | Freq: Four times a day (QID) | INTRAMUSCULAR | Status: DC | PRN

## 2015-01-19 MED ORDER — ASPIRIN 81 MG PO TABS: 81.0000 mg | ORAL_TABLET | Freq: Every evening | ORAL | Status: DC

## 2015-01-19 MED ORDER — LIDOCAINE HCL (PF) 1 % IJ SOLN: 5.0000 mL | INTRAMUSCULAR | Status: DC | PRN

## 2015-01-19 MED ORDER — ALTEPLASE 2 MG IJ SOLR: 2.0000 mg | Freq: Once | INTRAMUSCULAR | Status: DC | PRN

## 2015-01-19 MED ORDER — FAMOTIDINE 20 MG PO TABS
20.0000 mg | ORAL_TABLET | Freq: Every day | ORAL | Status: DC
  Administered 2015-01-21 – 2015-01-22 (×2): 20 mg via ORAL
  Filled 2015-01-19 (×2): qty 1

## 2015-01-19 MED ORDER — SODIUM CHLORIDE 0.9 % IV SOLN
INTRAVENOUS | Status: DC
  Administered 2015-01-20 – 2015-04-30 (×2): via INTRAVENOUS

## 2015-01-19 MED ORDER — CAMPHOR-MENTHOL 0.5-0.5 % EX LOTN
TOPICAL_LOTION | CUTANEOUS | Status: DC | PRN
  Administered 2015-01-19: 1 via TOPICAL
  Filled 2015-01-19: qty 222

## 2015-01-19 MED ORDER — HYDROMORPHONE HCL 1 MG/ML IJ SOLN
INTRAMUSCULAR | Status: AC
  Administered 2015-01-19: 1 mg
  Filled 2015-01-19: qty 1

## 2015-01-19 MED ORDER — METOCLOPRAMIDE HCL 5 MG PO TABS
5.0000 mg | ORAL_TABLET | Freq: Three times a day (TID) | ORAL | Status: DC
  Administered 2015-01-19 – 2015-01-21 (×10): 5 mg via ORAL
  Filled 2015-01-19 (×11): qty 1

## 2015-01-19 MED ORDER — CALCIUM ACETATE (PHOS BINDER) 667 MG PO CAPS
1334.0000 mg | ORAL_CAPSULE | Freq: Three times a day (TID) | ORAL | Status: DC
  Administered 2015-01-19 (×3): 1334 mg via ORAL
  Filled 2015-01-19 (×3): qty 2

## 2015-01-19 MED ORDER — HYDROCORTISONE 2.5 % EX CREA: TOPICAL_CREAM | Freq: Three times a day (TID) | CUTANEOUS | Status: DC

## 2015-01-19 MED ORDER — SEVELAMER CARBONATE 800 MG PO TABS
1600.0000 mg | ORAL_TABLET | Freq: Three times a day (TID) | ORAL | Status: DC
  Administered 2015-01-19 (×3): 1600 mg via ORAL
  Filled 2015-01-19 (×3): qty 2

## 2015-01-19 MED ORDER — VANCOMYCIN HCL 10 G IV SOLR
1500.0000 mg | INTRAVENOUS | Status: AC
  Filled 2015-01-19: qty 1500

## 2015-01-19 MED ORDER — CETYLPYRIDINIUM CHLORIDE 0.05 % MT LIQD
7.0000 mL | Freq: Two times a day (BID) | OROMUCOSAL | Status: DC
Start: 2015-01-19 — End: 2015-01-20
  Administered 2015-01-19 (×2): 7 mL via OROMUCOSAL

## 2015-01-19 MED ORDER — CALCIUM ACETATE 667 MG PO CAPS: 667.0000 mg | ORAL_CAPSULE | ORAL | Status: DC

## 2015-01-19 MED ORDER — PENTAFLUOROPROP-TETRAFLUOROETH EX AERO
1.0000 | INHALATION_SPRAY | CUTANEOUS | Status: DC | PRN
Start: 2015-01-19 — End: 2015-01-19

## 2015-01-19 MED ORDER — FAMOTIDINE 20 MG PO TABS
20.0000 mg | ORAL_TABLET | Freq: Two times a day (BID) | ORAL | Status: DC
  Administered 2015-01-19: 20 mg via ORAL
  Filled 2015-01-19: qty 1

## 2015-01-19 MED ORDER — ATORVASTATIN CALCIUM 10 MG PO TABS
10.0000 mg | ORAL_TABLET | Freq: Every evening | ORAL | Status: DC
  Administered 2015-01-19 – 2015-01-21 (×3): 10 mg via ORAL
  Filled 2015-01-19 (×3): qty 1

## 2015-01-19 MED ORDER — ASPIRIN 81 MG PO CHEW
81.0000 mg | CHEWABLE_TABLET | Freq: Every day | ORAL | Status: DC
  Administered 2015-01-19 – 2015-01-22 (×4): 81 mg via ORAL
  Filled 2015-01-19 (×4): qty 1

## 2015-01-19 NOTE — Progress Notes (Signed)
Spoke with Colletta Maryland at MD's office and she was informed that pt is to stay on Effient pre-operatively. Pt is currently an inpatient.

## 2015-01-19 NOTE — Progress Notes (Signed)
[facility] Progress Note  Assessment/Plan: 1. Dyspnea/ pulm edema -HD completed this AM. UF 3 liters. Patient currently asymptomatic without C/O SOB. Continue to lower EDW. 2. ESRD -MWF at [facility].  3. Anemia - HGB 10.1 follow CBC.  4. Secondary hyperparathyroidism - Ca 8.4 C Ca 9.04 on hectoral, renvela. Last Phos 6.6 01/07/15 5. HTN/volume - HD today. Net UF 3000 post wt 127.7. Believe he needs EDW lowered. Consider HD tomorrow 3 hours and challenge for 3 liters. 6. Nutrition - Carb mod/renal diet. Albumin 3.2. Order supplements. 7. For ligation of AVF/Insertion of perm cath. Scheduled for tomorrow. [facility] notified of pt admission.   Rita H. Brown NP-C 01/19/2015, 10:22 AM  [facility] [phone]  Subjective:   I'm itching". No C/O SOB. Just completed HD.  Objective Filed Vitals:   01/19/15 0700 01/19/15 0730 01/19/15 0755 01/19/15 0844  BP: 133/68 130/59 134/54 141/53  Pulse: 80 79 79 81  Temp:   98.2 F (36.8 C) 98.6 F (37 C)  TempSrc:   Oral Oral  Resp: 20 20 18 20   Height:      Weight:   127.7 kg (281 lb 8.4 oz)   SpO2: 100%  99% 93%   Physical Exam General: well nourished male, NAD Heart: Heart sounds distant, S1, S2, no M/G/R Lungs: Bilateral breath sounds CTA Abdomen: active BS, soft, nontender Extremities:L BKA with prothesis. RLE with compression hose, trace edema. Dialysis Access: RFA AVF + thrill/+ Bruit. For revision tomorrow and insertion of perm cath per VVS.   Dialysis Orders: MWF at [place]: [place]: 2.0 K 2.25 Ca BRF/DFR:  400/800 Heparin: 3700 units per Tx  Venofer 50 mg IV weekly (last dose 01/14/15) Mircera 75 mcg IV q 2 weeks (last dose 01/14/15) Hectoral 2 mcg IV q MWF (last dose 01/16/15)  Additional Objective Labs: Basic Metabolic Panel:  Recent Labs Lab 01/18/15 1554 01/18/15 1657 01/19/15 0458  NA 139 138 137  K 4.9 4.8 5.6*  CL 103 98* 99*  CO2  --  27 25  GLUCOSE 89 81 252*  BUN 56* 58*  62*  CREATININE 8.30* 8.97* 9.66*  CALCIUM  --  8.9 8.4*   Liver Function Tests:  Recent Labs Lab 01/18/15 1657 01/19/15 0458  AST 20 16  ALT 18 18  ALKPHOS 170* 156*  BILITOT 0.6 0.6  PROT 7.2 7.1  ALBUMIN 3.4* 3.2*   No results for input(s): LIPASE, AMYLASE in the last 168 hours. CBC:  Recent Labs Lab 01/18/15 1554 01/18/15 1559 01/19/15 0458  WBC  --  9.5 9.3  NEUTROABS  --  5.3 5.3  HGB 11.9* 10.7* 10.1*  HCT 35.0* 34.1* 32.2*  MCV  --  103.3* 102.2*  PLT  --  229 205   Blood Culture    Component Value Date/Time   SDES BLOOD LEFT HAND 07/04/2012 2014   SPECREQUEST BOTTLES DRAWN AEROBIC ONLY 4CC 07/04/2012 2014   CULT NO GROWTH 5 DAYS 07/04/2012 2014   REPTSTATUS 07/11/2012 FINAL 07/04/2012 2014    Cardiac Enzymes: No results for input(s): CKTOTAL, CKMB, CKMBINDEX, TROPONINI in the last 168 hours. CBG:  Recent Labs Lab 01/19/15 0144 01/19/15 0213 01/19/15 0236 01/19/15 0451 01/19/15 0652  GLUCAP 48* 56* 179* 202* 100*   Iron Studies: No results for input(s): IRON, TIBC, TRANSFERRIN, FERRITIN in the last 72 hours. @lablastinr3 @ Studies/Results: Dg Chest 2 View  01/18/2015   CLINICAL DATA:  Dyspnea since 01/17/2015.  EXAM: CHEST  2 VIEW  COMPARISON:  PA and lateral chest 09/30/2014 and 05/13/2014. CT chest 10/02/2014.  FINDINGS: There is cardiomegaly and interstitial pulmonary edema. No consolidative process, pneumothorax or effusion is identified.  IMPRESSION: Cardiomegaly and interstitial edema.   Electronically Signed   By: Inge Rise M.D.   On: 01/18/2015 16:19   Medications:   . allopurinol  200 mg Oral Daily  . antiseptic oral rinse  7 mL Mouth Rinse BID  . aspirin  81 mg Oral Daily  . atorvastatin  10 mg Oral QPM  . calcium acetate  1,334 mg Oral TID WC  . docusate sodium  200 mg Oral TID  . famotidine  20 mg Oral BID  . furosemide  80 mg Oral BID  . insulin pump   Subcutaneous 6 times per day  . levothyroxine  100 mcg Oral QAC  breakfast  . loratadine  10 mg Oral Daily  . methylPREDNISolone  4 mg Oral TID WC   Followed by  . [START ON 01/20/2015] methylPREDNISolone  4 mg Oral BID WC   Followed by  . [START ON 01/21/2015] methylPREDNISolone  4 mg Oral Q breakfast  . metoCLOPramide  5 mg Oral TID PC & HS  . [START ON 01/20/2015] metoprolol succinate  12.5 mg Oral Q T,Th,S,Su  . multivitamin  1 tablet Oral QHS  . prasugrel  10 mg Oral Daily  . rOPINIRole  2 mg Oral Q supper  . sevelamer carbonate  1,600 mg Oral TID WC  . sodium chloride  3 mL Intravenous Q12H  . sodium chloride  3 mL Intravenous Q12H   I have seen and examined this patient and agree with plan per Juanell Fairly.  Pt seen on HD.  Will plan HD again in AM.  Briann Sarchet T,MD 01/19/2015 1:26 PM

## 2015-01-19 NOTE — Progress Notes (Signed)
Patient has been instructed to resume insulin pump management.

## 2015-01-19 NOTE — Progress Notes (Signed)
error 

## 2015-01-19 NOTE — Progress Notes (Signed)
Hypoglycemic Event  CBG: 50  Treatment: juice  Symptoms: shaky  Follow-up CBG: Time:0144 CBG Result:48  Possible Reasons for Event: unknown  Comments/MD notified:K. Schorr NP notified. New orders for D50 50 ml    Dale [name]  Remember to initiate Hypoglycemia Order Set & complete

## 2015-01-19 NOTE — Progress Notes (Signed)
Blood glucose result 202

## 2015-01-19 NOTE — Telephone Encounter (Signed)
-----   Message from Burnell Blanks, MD sent at 01/08/2015 12:35 PM EDT ----- Regarding: RE: Hold Effient pre-op Gerald Stabs,  He had a drug eluting stent placed in the LAD in December 2015. We would optimally like to wait for 12 months of dual antiplatelet therapy before stopping Effient but if his procedure is necessary then agree to hold Effient and proceed with the vascular procedure later this month.  Clayton Bibles  ----- Message -----    From: Denman George, RN    Sent: 01/08/2015   8:56 AM      To: Burnell Blanks, MD Subject: Hold Effient pre-op                            FYI: This pt. is scheduled for Creation of left arm AVF vs Insertion of left arm AVG 01/20/15 per Dr. [name].  He has been advised to hold his Effient x 7 days prior.  He is to continue his ASA daily.

## 2015-01-19 NOTE — Progress Notes (Addendum)
  [facility] Progress Note  Informed of patient's admission today secondary to shortness. The patient is scheduled for ligation of his right arm graft and diatek catheter insertion tomorrow with Dr. Scot Dock. He underwent HD this morning and 3L were removed. The patient currently denies any shortness of breath.   For ligation of right arm fistula and insertion of tunneled dialysis catheter tomorrow 01/20/15 with Dr. Scot Dock. Obtain consent. NPO past midnight.    Virgina Jock, PA-C [facility] Office: [phone] Pager: [phone] 01/19/2015 2:36 PM  Agree with above.  [name], MD, [place] [phone] Office: [phone]

## 2015-01-19 NOTE — Procedures (Signed)
Pt seen at end of HD.  Tolerated well.  Pulled 3 L.  Feeling better.  Plan was to have AVF ligated tomorrow as outpt.

## 2015-01-19 NOTE — Progress Notes (Addendum)
   [facility]                                                                              Patient Demographics  Dale [name], is a 49 y.o. male, DOB - [birth year], [id number]  Admit date - 01/18/2015   Admitting Physician Reubin Milan, MD  Outpatient Primary MD for the patient is Windy Kalata, MD  LOS - 1   Chief Complaint  Patient presents with  . Shortness of Breath      HPI on 01/19/2015 by Dr. Tennis Must Kizzie Ide Dale [name] is a 49 y.o. male with a past medical history of ESRD on hemodialysis, diastolic CHF, CAD, hypertension, hyperlipidemia, hypothyroidism, anemia of renal disease, fibromyalgia, chronic constipation who comes to the emergency department due to progressively worse shortness of breath since yesterday. Per patient, he was taking a nap on Saturday afternoon and woke up short of breath. He states he has been dyspneic since then and noticed that his weight was at least 3 kg over his postdialysis weight. He stated that he had to sleep on his recliner. He denies chest pain, palpitations, dizziness, diaphoresis, worsening lower extremity edema, but complains of orthopnea. He denies fever, chills, but feels fatigue. He complains of cough, but denies sputum production.   When seen, patient was in no acute distress. He states that he feels better with the oxygen. However he complains of severe itching due to an allergic reaction to IV contrast material given about 3 weeks ago.  Assessment & Plan   Patient admitted earlier this morning by Dr. Tennis Must. Please see full H&P for details.  Acute dyspnea secondary to volume overload/pulmonary edema -Appears to be improving -Continue hemodialysis per nephrology -Patient currently is on room air  End-stage renal disease -Patient dialyzes Monday, Wednesday, Friday -Nephrology consult appreciated -Patient did dialyze this morning with 3 L removed -Nephrology considering dialysis [id number]  Chronic diastolic  heart failure -Continue HD -Monitor output, intake, daily weights -Continue statin, metoprolol, aspirin  Anemia of chronic disease -Hemoglobin currently 10.1, continue to monitor CBC  Hypertension -Continue metoprolol   Coronary artery disease -Currently stable, no chest pain -Continue aspirin, Effient  IV contrast allergy -Continue Benadryl as needed along with Medrol tapering  Ligation of aVF and insertion of PermCath -Vascular surgery consulted and appreciated  Diabetes mellitus  -Continue insulin pump  Hypothyroidism -Continue Synthroid  Code Status: Full  Family Communication: None at bedsiide  Disposition Plan: Admitted.  Patient may require extra HD.   Time Spent in minutes   30 minutes  Procedures  none  Consults   Nephrology Vascular surgery  DVT Prophylaxis  SCDs  Kelcee Bjorn D.O. on 01/19/2015 at 1:45 PM  Between 7am to 7pm - Pager - [phone]  After 7pm go to [email or web address] - password [email or web address]  And look for the night coverage person covering for me after hours  [facility] Office  [phone]

## 2015-01-19 NOTE — Progress Notes (Signed)
Hypoglycemic Event  CBG: 56  Treatment: D50 50 ml  Symptoms: none  Follow-up CBG: Time:0236 CBG Result:179  Possible Reasons for Event: unknown  Comments/MD notified:K. Schorr NP notified. New orders recieved    Dale [name]  Remember to initiate Hypoglycemia Order Set & complete

## 2015-01-19 NOTE — Progress Notes (Signed)
Insulin pump turned off by patient due second episode of hypoglycemia. MD notified. New parameters given. Please see hypoglycemia Event notes.

## 2015-01-19 NOTE — Progress Notes (Addendum)
Anesthesia Chart Review: Patient is a 49 year old male scheduled for ligation of right arm AVF and insertion of right hemodialysis catheter on 01/20/15 by Dr. Scot Dock. Patient was scheduled to be a same day work-up, but is now admitted since 01/18/15 for SOB/volume overload. A/P from today indicates that 3L removed on HD today. Vascular surgery was also consulted due to upcoming scheduled surgery. Admission notes indicate that patient will likely stay admitted until HD access procedure is done. 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 He underwent revision of AVF with plication of aneurysm on 11/20/14. There was question of whether he would have a basilic vein transposition first, but I was told that was placed on hold (possibly due to need to stay on dual anti-platelet therapy for DES 03/2014).   History includes ESRD s/p renal transplant '95 but now on HD (MWF), CAD s/p DES RCA 04/09/14, PAD s/p left BKA '10, DM1 with insulin pump, hypothyroidism, anemia, fibromyalgia, hypercholesterolemia, OSA (can't tolerate CPAP due to claustrophobia), GERD. Last year he was evaluated at [facility] for consideration of right first rib resection and right subclavian venoplasty due to ongoing thoracic outlet syndrome and outflow stenosis of his right brachiocephalic fistula. However, venogram was performed in 10/2013 and his right SCV was occluded and unable to be revascularized (see Care Everywhere), DIFFICULT INTUBATION. BMI is consistent with morbid obesity. Nephrologist is Dr. Mercy Moore. Endocrinologist is Dr. Iran Planas with [facility]. On 01/16/15, our PAT RN notified the DM coordinator of his date of surgery. Will defer pre-operative insulin pump management to the Hosptialist.  In regards to his anesthesia history, I reviewed [facility] records in [place] from 10/16/13 that indicate glidescope was used. At [facility] on 08/10/11, he underwent GETA using  IV induction, cricoid pression, MAC 4, stylet to place 7.5 mm ETT.  Cardiologist is Dr. [name], last cardiology evaluation was with Richardson Dopp, PA-C on 11/27/14.  PAT RN notes indicate that patient was to stay on Effient and ASA peri-operatively (had DES 03/2014).   Meds include allopurinol, ASA 81 mg, Lipitor, Phoslo, Effient, Dilaudid, insulin pump, Lasix, levothyroxine, Medrol Dosepak, Claritin, Reglan, Toprol XL, Nitro, Phenergan, Requip, Renvela  09/18/14 Echo: Study Conclusions - Left ventricle: The cavity size was normal. Wall thickness was increased in a pattern of severe LVH. Systolic function was normal. The estimated ejection fraction was in the range of 55% to 60%. Wall motion was normal; there were no regional wall motion abnormalities. Doppler parameters are consistent with abnormal left ventricular relaxation (grade 1 diastolicdysfunction). - Mitral valve: Calcified annulus. - Left atrium: The atrium was mildly dilated.  03/19/14 Cardiac cath (done due to abnormal stress test):  Angiographic Findings: Left main: No obstructive disease.  Left Anterior Descending Artery: Large caliber vessel that courses to the apex. There is diffuse mild plaque in the proximal and mid vessel. There is a moderate caliber diagonal branch with mid to distal 80% stenosis.  Circumflex Artery: Large vessel with large obtuse marginal branch. Mild diffuse plaque.  Right Coronary Artery: Large caliber dominant vessel with 99% sub-total occlusion (functional CTO) with TIMI-1 flow noted beyond the stenosis. The distal vessel is also noted to have a 80% stenosis. The distal vessel fills from left to right collaterals.  Left Ventricular Angiogram: Deferred.  Impression: 1. Severe stenosis mid RCA, s/p balloon angioplasty only of the mid RCA. (unable to deliver stent) 2. Severe stenosis mid to distal diagonal-medical management of this vessel  3. Patent Circumflex and LAD Recommendations: I was unable to  deliver a stent into the mid RCA despite aggressive guide support with a Guideliner secondary to diffuse, severe calcification of the vessel. Will place on telemetry unit tonight and monitor closely. He will need rotablator atherectomy of the mid RCA. This will be staged in 2-3 weeks. I will plan on discharge home in the am unless he has clinical instability. I will contact him to arrange a date and time for cath/PCI. Will continue ASA, Plavix, statin, beta blocker. (Dr. [name]). Intervention (04/09/14): Successful rotablator atherectomy and PTCA/DES mid RCA.  01/18/15 EKG: NSR.   01/18/15 CXR: IMPRESSION: ardiomegaly and interstitial edema.  Labs from 01/18/15-01/19/15 0458 noted. If labs not done tomorrow morning then he will need an ISTAT preoperatively.   George Hugh [facility] Short Stay Center/Anesthesiology Phone [phone] 01/19/2015 2:58 PM

## 2015-01-19 NOTE — H&P (Signed)
[facility] History and Physical  Dale [name] [id number] DOB: January 31, 1966 DOA: 01/18/2015  Referring physician: Antony Blackbird, MD  PCP: Windy Kalata, MD   Chief Complaint: Shortness of breath.  HPI: Dale [name] is a 49 y.o. male with a past medical history of ESRD on hemodialysis, diastolic CHF, CAD, hypertension, hyperlipidemia, hypothyroidism, anemia of renal disease, fibromyalgia, chronic constipation who comes to the emergency department due to progressively worse shortness of breath since yesterday. Per patient, he was taking a nap on Saturday afternoon and woke up short of breath. He states he has been dyspneic since then and noticed that his weight was at least 3 kg over his postdialysis weight. He stated that he had to sleep on his recliner. He denies chest pain, palpitations, dizziness, diaphoresis, worsening lower extremity edema, but complains of orthopnea. He denies fever, chills, but feels fatigue. He complains of cough, but denies sputum production.   When seen, patient was in no acute distress. He states that he feels better with the oxygen. However he complains of severe itching due to an allergic reaction to IV contrast material given about 3 weeks ago.   Review of Systems:  Constitutional:  Positive weight gain of 3 kg and fatigue. No weight loss, night sweats, Fevers, chills,   HEENT:  No headaches, Difficulty swallowing,Tooth/dental problems,Sore throat,  No sneezing, itching, ear ache, nasal congestion, post nasal drip,  Cardio-vascular:  swelling in lower extremities, Orthopnea, No chest pain,  PND,  anasarca, dizziness, palpitations  GI:  Mild nausea, positive constipation. No heartburn, indigestion, abdominal pain,  vomiting, diarrhea, change in bowel habits, loss of appetite  Resp:  Positive shortness of breath with exertion or at rest. Positive non-productive cough, No coughing up of blood.No change in color of mucus.No wheezing.No  chest wall deformity  Skin:  Positive erythematous rash and peeled skin in multiple areas. GU:  no dysuria, change in color of urine, no urgency or frequency. No flank pain.  Musculoskeletal:  Chronic back pain.  Frequent myalgias. No joint pain or swelling. No decreased range of motion.  Psych:  No change in mood or affect. No depression or anxiety. No memory loss.   Past Medical History  Diagnosis Date  . Hypertension   . Anemia of chronic disease   . Cholecystitis     a. 08/27/2011  . Constipation   . Pericardial effusion     a.  Small by CT 08/22/11;  b.  Large by CT 08/27/11  . Inguinal lymphadenopathy     a. bilateral - s/p biopsy 07/2011  . Fibromyalgia   . Chronic diastolic CHF (congestive heart failure)     a. Echo 3/13:  EF 55-60%;  b. Echo (11/15):  Mild LVH, EF 60-65%, no RWMA, Gr 1 DD, MAC, mild LAE, normal RVF, mild RAE;  c.  Echo 5/16:  severe LVH, EF 55-60%, no RWMA, Gr 1 DD, MAC, mild LAE  . HLD (hyperlipidemia)   . Hypothyroidism   . GERD (gastroesophageal reflux disease)   . CAD (coronary artery disease)     a. Lexiscan Myoview (11/15):  Inf, apical cap and apical lateral ischemia, EF 56%, inf HK, Moderate Risk;  b. LHC (11/15):  mid to dist Dx 80%, mid RCA 99% (functional CTO), dist RCA 80% >> PCI: balloon angioplasty to mid RCA (could not deliver stent) - plan staged PCI with HSRA of RCA; Dx to be tx medically >> PCI: Rotoblator atherectomy/DES to [facility]  . OSA (obstructive sleep  apnea)     adjustable bed  . Shortness of breath dyspnea     too much Fluid  . Pneumonia ~ 2007  . Insulin dependent diabetes mellitus     Type I  . Claustrophobia     when things get around his face.   [name] Kitchen ESRD (end stage renal disease)     a. 1995 s/p cadaveric transplant w/ susbequent failure after 18 yrs;  b.Dialysis initiated 07/2011 [facility]  . Constipation   . Arthritis     "hands, right knee" (03/19/2014)  . History of blood transfusion    Past Surgical History  Procedure  Laterality Date  . Kidney transplant  04/25/1993  . Cataract extraction w/ intraocular lens  implant, bilateral Bilateral 1990's  . Arteriovenous graft placement Left 1993?    forearm  . Arteriovenous graft placement Right 1993?    leg  . Toe amputation Bilateral after 2005  . Below knee leg amputation Left 2010  . Carpal tunnel release Bilateral after 2005  . Shoulder arthroscopy Left ~ 2006    frozen  . Av fistula placement  07/29/2011    Procedure: ARTERIOVENOUS (AV) FISTULA CREATION;  Surgeon: Mal Misty, MD;  Location: [place];  Service: Vascular;  Laterality: Right;  Brachial cephalic  . Insertion of dialysis catheter  08/01/2011    Procedure: INSERTION OF DIALYSIS CATHETER;  Surgeon: Rosetta Posner, MD;  Location: [place];  Service: Vascular;  Laterality: Right;  insertion of dialysis catheter on right internal jugular vein  . Lymph node biopsy  08/10/2011    Procedure: LYMPH NODE BIOPSY;  Surgeon: Merrie Roof, MD;  Location: [place];  Service: General;  Laterality: Left;  left groin lymph biopsy  . Colonoscopy  08/29/2011    Procedure: COLONOSCOPY;  Surgeon: Jerene Bears, MD;  Location: [place];  Service: Gastroenterology;  Laterality: N/A;  . Appendectomy  ~ 2006  . Below knee leg amputation Left   . Umbilical hernia repair  1990's  . Cardiac catheterization  03/19/2014  . Av graft placement Left 1993?    "attempted one in my wrist; didn't take"  . Neuroplasty / transposition ulnar nerve at elbow Left after 2005  . Venogram Right 07/28/2011    Procedure: VENOGRAM;  Surgeon: Conrad Wellston, MD;  Location: [facility] CATH LAB;  Service: Cardiovascular;  Laterality: Right;  . Left heart catheterization with coronary angiogram N/A 03/19/2014    Procedure: LEFT HEART CATHETERIZATION WITH CORONARY ANGIOGRAM;  Surgeon: Burnell Blanks, MD;  Location: [facility] CATH LAB;  Service: Cardiovascular;  Laterality: N/A;  . Cardiac catheterization  03/19/2014    Procedure: CORONARY BALLOON ANGIOPLASTY;   Surgeon: Burnell Blanks, MD;  Location: [facility] CATH LAB;  Service: Cardiovascular;;  . Coronary angioplasty with stent placement  04/09/2014        ptca/des mid lad   . Angioplasty  04/09/2014    RCA   . Femoral artery repair  04/09/2014    ANGIOSEAL  . Percutaneous coronary rotoblator intervention (pci-r) N/A 04/09/2014    Procedure: PERCUTANEOUS CORONARY ROTOBLATOR INTERVENTION (PCI-R);  Surgeon: Burnell Blanks, MD;  Location: [facility] CATH LAB;  Service: Cardiovascular;  Laterality: N/A;  . Revison of arteriovenous fistula Right [id number]    Procedure: PLICATION / REVISION OF ARTERIOVENOUS FISTULA;  Surgeon: Angelia Mould, MD;  Location: [place];  Service: Vascular;  Laterality: Right;  . Peripheral vascular catheterization N/A 12/25/2014    Procedure: Upper Extremity Venography;  Surgeon: Conrad [place],  MD;  Location: [place] CV LAB;  Service: Cardiovascular;  Laterality: N/A;  . Carpal tunnel release Bilateral   . Eye surgery Bilateral     cataract  . Amputation of replicated toes Right   . Vitrectomy Bilateral   . Peritoneal catheter insertion    . Peritoneal catheter removal    . Arteriovenous graft placement Left     Thigh  . Arteriovenous graft removed Left     Thigh   Social History:  reports that he has never smoked. He has never used smokeless tobacco. He reports that he drinks alcohol. He reports that he does not use illicit drugs.  Allergies  Allergen Reactions  . Morphine And Related Hives and Nausea Only  . Amoxicillin Other (See Comments)    Reaction unknown  . Ciprofloxacin Other (See Comments)    Reaction unknown  . Clindamycin/Lincomycin Other (See Comments)    Reaction unknown  . Codeine Other (See Comments)    Reaction unknown. But pt has tolerated hydrocodone in Vicodin and Norco in the past  . Contrast Media [Iodinated Diagnostic Agents] Rash  . Doxycycline Rash  . Hydrocodone Rash  . Levofloxacin Other (See Comments)    Reaction unknown    . Oxycodone Rash  . Plavix [Clopidogrel Bisulfate] Rash  . Rocephin [Ceftriaxone] Itching  . Vasotec Cough    Family History  Problem Relation Age of Onset  . Malignant hyperthermia Neg Hx   . Heart attack Neg Hx   . Stroke Neg Hx   . Colon polyps Father   . Diabetes Father   . Hypertension Father   . Breast cancer Maternal Aunt   . Cancer Maternal Aunt     Breast and Bone  . Hypertension Mother   . Diabetes Mother   . Hyperlipidemia Mother     Prior to Admission medications   Medication Sig Start Date End Date Taking? Authorizing Provider  allopurinol (ZYLOPRIM) 100 MG tablet Take 200 mg by mouth daily.  03/14/12  Yes Historical Provider, MD  aspirin 81 MG tablet Take 81 mg by mouth every evening.    Yes Historical Provider, MD  atorvastatin (LIPITOR) 10 MG tablet Take 10 mg by mouth every evening.    Yes Historical Provider, MD  calcium acetate (PHOSLO) 667 MG capsule Take 667-1,334 mg by mouth See admin instructions. Take 1,334 mg (2 capsules) with meals and 667 (1 capsule) with snacks 09/19/12  Yes Historical Provider, MD  diphenhydrAMINE (BENADRYL) 25 mg capsule Take 1-2 capsules (25-50 mg total) by mouth every 6 (six) hours as needed for itching. 04/11/14  Yes Rhonda G Barrett, PA-C  docusate sodium (COLACE) 100 MG capsule Take 200 mg by mouth 3 (three) times daily.    Yes Historical Provider, MD  EFFIENT 10 MG TABS tablet TAKE 1 TABLET BY MOUTH EVERY DAY 10/08/14  Yes Burnell Blanks, MD  furosemide (LASIX) 80 MG tablet Take 80 mg by mouth 2 (two) times daily.  11/25/14  Yes Historical Provider, MD  GLUCAGON EMERGENCY 1 MG injection Inject 1 mg into the muscle daily as needed (low blood sugar).  01/19/12  Yes Historical Provider, MD  hydrocortisone 2.5 % cream APPLY 2-3 TIMES DAILY AS NEEDED TO AFFECTED AREA(S). 10/30/14  Yes Historical Provider, MD  HYDROmorphone (DILAUDID) 2 MG tablet Take 1 tablet (2 mg total) by mouth 3 (three) times daily as needed for severe pain.  11/20/14  Yes Alvia Grove, PA-C  Insulin Human (INSULIN PUMP) 100 unit/ml SOLN Inject into  the skin continuous. Humalog   Yes Historical Provider, MD  levothyroxine (SYNTHROID, LEVOTHROID) 100 MCG tablet Take 100 mcg by mouth daily before breakfast.   Yes Historical Provider, MD  loratadine (CLARITIN) 10 MG tablet Take 10 mg by mouth daily.   Yes Historical Provider, MD  methylPREDNISolone (MEDROL DOSEPAK) 4 MG TBPK tablet Take 4 mg by mouth 4 (four) times daily.    Yes Historical Provider, MD  metoCLOPramide (REGLAN) 5 MG tablet Take 5 mg by mouth 4 (four) times daily - after meals and at bedtime. 05/26/11  Yes Historical Provider, MD  metoprolol succinate (TOPROL-XL) 25 MG 24 hr tablet Take 0.5 tablets (12.5 mg total) by mouth as directed. Take 12.5 mg on Tu, Th, Sat and Sun; HOLD on dialysis days M, W, F Patient taking differently: Take 12.5 mg by mouth daily.  11/27/14  Yes Liliane Shi, PA-C  multivitamin (RENA-VIT) TABS tablet Take 1 tablet by mouth daily.   Yes Historical Provider, MD  nitroGLYCERIN (NITROSTAT) 0.4 MG SL tablet Place 0.4 mg under the tongue every 5 (five) minutes as needed for chest pain.    Yes [name], MD  promethazine (PHENERGAN) 25 MG tablet Take 1 tablet (25 mg total) by mouth every 6 (six) hours as needed for nausea or vomiting. For nausea 11/27/14  Yes Liliane Shi, PA-C  ranitidine (ZANTAC) 150 MG tablet Take 150 mg by mouth 2 (two) times daily. 11/01/14  Yes Historical Provider, MD  rOPINIRole (REQUIP) 2 MG tablet Take 2 mg by mouth daily with supper.    Yes Historical Provider, MD  sevelamer carbonate (RENVELA) 800 MG tablet Take 1,600 mg by mouth 3 (three) times daily with meals.   Yes Historical Provider, MD   Physical Exam: Filed Vitals:   01/18/15 2100 01/18/15 2130 01/18/15 2200 01/19/15 0006  BP: 169/76 175/74 169/74 177/81  Pulse: 79 81 80 80  Temp:    98.4 F (36.9 C)  TempSrc:    Oral  Resp: 16 21 27 18   Height:    5\' 8"  (1.727 m)  Weight:     127.007 kg (280 lb)  SpO2: 98% 95% 94% 100%    Wt Readings from Last 3 Encounters:  01/19/15 127.007 kg (280 lb)  01/07/15 126 kg (277 lb 12.5 oz)  12/25/14 127.007 kg (280 lb)    General:  Appears calm and comfortable Eyes: PERRL, normal lids, irises & conjunctiva ENT: grossly normal hearing, lips & tongue Neck: no LAD, masses or thyromegaly Cardiovascular: RRR, no m/r/g. No LE edema. Telemetry: SR, no arrhythmias  Respiratory: Rales bilaterally, no wheezing or rhonchi on auscultation. Abdomen: Obese, soft, ntnd Skin: Erythematous rash and peeled skin in multiple areas. Musculoskeletal: Upper extremities show scars and AV graft. Left BKA. Psychiatric: grossly normal mood and affect, speech fluent and appropriate Neurologic: grossly non-focal.          Labs on Admission:  Basic Metabolic Panel:  Recent Labs Lab 01/18/15 1554 01/18/15 1657  NA 139 138  K 4.9 4.8  CL 103 98*  CO2  --  27  GLUCOSE 89 81  BUN 56* 58*  CREATININE 8.30* 8.97*  CALCIUM  --  8.9   Liver Function Tests:  Recent Labs Lab 01/18/15 1657  AST 20  ALT 18  ALKPHOS 170*  BILITOT 0.6  PROT 7.2  ALBUMIN 3.4*   CBC:  Recent Labs Lab 01/18/15 1554 01/18/15 1559  WBC  --  9.5  NEUTROABS  --  5.3  HGB 11.9* 10.7*  HCT 35.0* 34.1*  MCV  --  103.3*  PLT  --  229    CBG:  Recent Labs Lab 01/18/15 1919 01/18/15 2218 01/19/15 0126 01/19/15 0144 01/19/15 0213  GLUCAP 143* 101* 50* 48* 56*    Radiological Exams on Admission: Dg Chest 2 View  01/18/2015   CLINICAL DATA:  Dyspnea since 01/17/2015.  EXAM: CHEST  2 VIEW  COMPARISON:  PA and lateral chest 09/30/2014 and 05/13/2014. CT chest 10/02/2014.  FINDINGS: There is cardiomegaly and interstitial pulmonary edema. No consolidative process, pneumothorax or effusion is identified.  IMPRESSION: Cardiomegaly and interstitial edema.   Electronically Signed   By: Inge Rise M.D.   On: 01/18/2015 16:19    Echocardiogram: May  2016 ------------------------------------------------------------------- LV EF: 55% -  60%  ------------------------------------------------------------------- Indications:   Chest pain 786.51.  ------------------------------------------------------------------- History:  PMH: Septic Shock, Respiratory Failure, Pericardial Effusion, ESRD. Coronary artery disease. Angina pectoris. Congestive heart failure.  ------------------------------------------------------------------- Study Conclusions  - Left ventricle: The cavity size was normal. Wall thickness was increased in a pattern of severe LVH. Systolic function was normal. The estimated ejection fraction was in the range of 55% to 60%. Wall motion was normal; there were no regional wall motion abnormalities. Doppler parameters are consistent with abnormal left ventricular relaxation (grade 1 diastolic dysfunction). - Mitral valve: Calcified annulus. - Left atrium: The atrium was mildly dilated.  EKG: Independently reviewed. Vent. rate 78 BPM PR interval 138 ms QRS duration 84 ms QT/QTc 404/460 ms P-R-T axes 27 85 61 Normal sinus rhythm Normal ECG No significant change since last tracing  Assessment/Plan Principal Problem:   Volume overload/Acute dyspnea Continue hemodialysis as per nephrology orders. Continue supplemental oxygen. Monitor electrolytes.  Active Problems:   History of renal transplant   ESRD (end stage renal disease) As per nephrology.    Chronic diastolic CHF (congestive heart failure) Continue hemodialysis as ordered by nephrology.    Anemia Monitor H&H. Epogen as per nephrology orders.      HTN (hypertension) Continue current antihypertensive therapy and monitor blood pressure.    CAD (coronary artery disease) No chest pain. Continue Effient and aspirin.    Allergic reaction to dye Continue Medrol tapering and IV Benadryl for itching.   The patient is scheduled for AV  graft closure on Tuesday and cath placement. The patient may need to remain in the hospital until this is done.  Code Status: Full code. DVT Prophylaxis: SCDs.  The patient is on Effient and aspirin at home. He gets heparin during hemodialysis. Family Communication: His wife Maudie Mercury was present in the room with him. Belk,Kimberly Spouse   [phone]    Disposition Plan: Admit for hemodialysis as per nephrology orders.  Time spent: Over 70 minutes were spent during the process of this admission.  Reubin Milan [facility] Pager [phone].

## 2015-01-20 ENCOUNTER — Inpatient Hospital Stay (HOSPITAL_COMMUNITY): Payer: Medicare Other

## 2015-01-20 ENCOUNTER — Inpatient Hospital Stay (HOSPITAL_COMMUNITY): Payer: Medicare Other | Admitting: Vascular Surgery

## 2015-01-20 ENCOUNTER — Ambulatory Visit (HOSPITAL_COMMUNITY): Admission: RE | Admit: 2015-01-20 | Payer: Medicare Other | Source: Ambulatory Visit | Admitting: Vascular Surgery

## 2015-01-20 ENCOUNTER — Encounter (HOSPITAL_COMMUNITY)
Admission: EM | Disposition: A | Discharge status: Home / Self Care | Payer: Self-pay | Source: Home / Self Care | Attending: Internal Medicine

## 2015-01-20 DIAGNOSIS — D649 Anemia, unspecified: Secondary | ICD-10-CM

## 2015-01-20 DIAGNOSIS — N186 End stage renal disease: Secondary | ICD-10-CM

## 2015-01-20 DIAGNOSIS — I10 Essential (primary) hypertension: Secondary | ICD-10-CM

## 2015-01-20 DIAGNOSIS — N185 Chronic kidney disease, stage 5: Secondary | ICD-10-CM

## 2015-01-20 DIAGNOSIS — T50995A Adverse effect of other drugs, medicaments and biological substances, initial encounter: Secondary | ICD-10-CM

## 2015-01-20 DIAGNOSIS — I5032 Chronic diastolic (congestive) heart failure: Secondary | ICD-10-CM

## 2015-01-20 DIAGNOSIS — I2510 Atherosclerotic heart disease of native coronary artery without angina pectoris: Secondary | ICD-10-CM

## 2015-01-20 HISTORY — PX: INSERTION OF DIALYSIS CATHETER: SHX1324

## 2015-01-20 HISTORY — PX: AV FISTULA PLACEMENT: SHX1204

## 2015-01-20 LAB — BASIC METABOLIC PANEL
Anion gap: 13 (ref 5–15)
BUN: 49 mg/dL — ABNORMAL HIGH (ref 6–20)
CO2: 30 mmol/L (ref 22–32)
Calcium: 8.9 mg/dL (ref 8.9–10.3)
Chloride: 95 mmol/L — ABNORMAL LOW (ref 101–111)
Creatinine, Ser: 7.31 mg/dL — ABNORMAL HIGH (ref 0.61–1.24)
GFR calc Af Amer: 9 mL/min — ABNORMAL LOW (ref >60)
GFR calc non Af Amer: 8 mL/min — ABNORMAL LOW (ref >60)
Glucose, Bld: 134 mg/dL — ABNORMAL HIGH (ref 65–99)
Potassium: 5.4 mmol/L — ABNORMAL HIGH (ref 3.5–5.1)
Sodium: 138 mmol/L (ref 135–145)

## 2015-01-20 LAB — GLUCOSE, CAPILLARY
Glucose-Capillary: 101 mg/dL — ABNORMAL HIGH (ref 65–99)
Glucose-Capillary: 126 mg/dL — ABNORMAL HIGH (ref 65–99)
Glucose-Capillary: 170 mg/dL — ABNORMAL HIGH (ref 65–99)
Glucose-Capillary: 177 mg/dL — ABNORMAL HIGH (ref 65–99)
Glucose-Capillary: 214 mg/dL — ABNORMAL HIGH (ref 65–99)
Glucose-Capillary: 80 mg/dL (ref 65–99)
Glucose-Capillary: 91 mg/dL (ref 65–99)
Glucose-Capillary: 91 mg/dL (ref 65–99)
Glucose-Capillary: 92 mg/dL (ref 65–99)

## 2015-01-20 LAB — CBC
HCT: 34.5 % — ABNORMAL LOW (ref 39.0–52.0)
Hemoglobin: 10.7 g/dL — ABNORMAL LOW (ref 13.0–17.0)
MCH: 32.2 pg (ref 26.0–34.0)
MCHC: 31 g/dL (ref 30.0–36.0)
MCV: 103.9 fL — ABNORMAL HIGH (ref 78.0–100.0)
Platelets: 182 10*3/uL (ref 150–400)
RBC: 3.32 MIL/uL — ABNORMAL LOW (ref 4.22–5.81)
RDW: 16.6 % — ABNORMAL HIGH (ref 11.5–15.5)
WBC: 8.6 10*3/uL (ref 4.0–10.5)

## 2015-01-20 LAB — SURGICAL PCR SCREEN
MRSA, PCR: NEGATIVE
Staphylococcus aureus: NEGATIVE

## 2015-01-20 SURGERY — ARTERIOVENOUS (AV) FISTULA CREATION
Anesthesia: General | Site: Arm Upper | Laterality: Right

## 2015-01-20 MED ORDER — FENTANYL CITRATE (PF) 100 MCG/2ML IJ SOLN
INTRAMUSCULAR | Status: AC
Start: 2015-01-20 — End: 2015-01-21
  Filled 2015-01-20: qty 2

## 2015-01-20 MED ORDER — CALCIUM ACETATE (PHOS BINDER) 667 MG PO CAPS
1334.0000 mg | ORAL_CAPSULE | Freq: Three times a day (TID) | ORAL | Status: DC
  Administered 2015-01-20 – 2015-01-22 (×4): 1334 mg via ORAL
  Filled 2015-01-20 (×4): qty 2

## 2015-01-20 MED ORDER — PROPOFOL 10 MG/ML IV BOLUS
INTRAVENOUS | Status: DC | PRN
  Administered 2015-01-20: 20 mg via INTRAVENOUS
  Administered 2015-01-20: 150 mg via INTRAVENOUS
  Administered 2015-01-20: 200 mg via INTRAVENOUS

## 2015-01-20 MED ORDER — LIDOCAINE-EPINEPHRINE (PF) 1 %-1:200000 IJ SOLN
INTRAMUSCULAR | Status: AC
  Filled 2015-01-20: qty 30

## 2015-01-20 MED ORDER — ONDANSETRON HCL 4 MG/2ML IJ SOLN
INTRAMUSCULAR | Status: DC | PRN
  Administered 2015-01-20: 4 mg via INTRAVENOUS

## 2015-01-20 MED ORDER — 0.9 % SODIUM CHLORIDE (POUR BTL) OPTIME
TOPICAL | Status: DC | PRN
  Administered 2015-01-20: 1000 mL

## 2015-01-20 MED ORDER — HEPARIN SODIUM (PORCINE) 1000 UNIT/ML IJ SOLN
INTRAMUSCULAR | Status: AC
  Filled 2015-01-20: qty 1

## 2015-01-20 MED ORDER — LIDOCAINE HCL (PF) 1 % IJ SOLN
INTRAMUSCULAR | Status: AC
  Filled 2015-01-20: qty 30

## 2015-01-20 MED ORDER — FENTANYL CITRATE (PF) 250 MCG/5ML IJ SOLN
INTRAMUSCULAR | Status: AC
  Filled 2015-01-20: qty 5

## 2015-01-20 MED ORDER — FENTANYL CITRATE (PF) 100 MCG/2ML IJ SOLN
25.0000 ug | INTRAMUSCULAR | Status: DC | PRN
  Administered 2015-01-20 (×3): 50 ug via INTRAVENOUS

## 2015-01-20 MED ORDER — MIDAZOLAM HCL 5 MG/5ML IJ SOLN
INTRAMUSCULAR | Status: DC | PRN
  Administered 2015-01-20: 2 mg via INTRAVENOUS

## 2015-01-20 MED ORDER — PROPOFOL 10 MG/ML IV BOLUS
INTRAVENOUS | Status: AC
  Filled 2015-01-20: qty 20

## 2015-01-20 MED ORDER — MIDAZOLAM HCL 2 MG/2ML IJ SOLN
INTRAMUSCULAR | Status: AC
  Filled 2015-01-20: qty 4

## 2015-01-20 MED ORDER — PROMETHAZINE HCL 25 MG/ML IJ SOLN: 6.2500 mg | INTRAMUSCULAR | Status: DC | PRN

## 2015-01-20 MED ORDER — FENTANYL CITRATE (PF) 100 MCG/2ML IJ SOLN
INTRAMUSCULAR | Status: DC | PRN
  Administered 2015-01-20 (×2): 50 ug via INTRAVENOUS

## 2015-01-20 MED ORDER — SEVELAMER CARBONATE 800 MG PO TABS
1600.0000 mg | ORAL_TABLET | Freq: Three times a day (TID) | ORAL | Status: DC
  Administered 2015-01-20 – 2015-01-22 (×4): 1600 mg via ORAL
  Filled 2015-01-20 (×4): qty 2

## 2015-01-20 MED ORDER — ONDANSETRON HCL 4 MG/2ML IJ SOLN: INTRAMUSCULAR | Status: DC | PRN

## 2015-01-20 MED ORDER — LIDOCAINE HCL 1 % IJ SOLN
INTRAMUSCULAR | Status: DC | PRN
  Administered 2015-01-20: 30 mL

## 2015-01-20 MED ORDER — HEPARIN SODIUM (PORCINE) 1000 UNIT/ML IJ SOLN
INTRAMUSCULAR | Status: DC | PRN
  Administered 2015-01-20: 1000 [IU]

## 2015-01-20 MED ORDER — SODIUM CHLORIDE 0.9 % IV SOLN
INTRAVENOUS | Status: DC | PRN
  Administered 2015-01-20: 10:00:00

## 2015-01-20 MED ORDER — SUCCINYLCHOLINE CHLORIDE 20 MG/ML IJ SOLN
INTRAMUSCULAR | Status: DC | PRN
  Administered 2015-01-20: 100 mg via INTRAVENOUS

## 2015-01-20 MED ORDER — LIDOCAINE HCL (CARDIAC) 20 MG/ML IV SOLN
INTRAVENOUS | Status: DC | PRN
  Administered 2015-01-20: 60 mg via INTRAVENOUS

## 2015-01-20 MED ORDER — SODIUM CHLORIDE 0.9 % IV SOLN
Freq: Once | INTRAVENOUS | Status: AC
  Administered 2015-01-20: 10:00:00 via INTRAVENOUS

## 2015-01-20 MED ORDER — FENTANYL CITRATE (PF) 100 MCG/2ML IJ SOLN
INTRAMUSCULAR | Status: AC
  Filled 2015-01-20: qty 2

## 2015-01-20 SURGICAL SUPPLY — 46 items
ARMBAND PINK RESTRICT EXTREMIT (MISCELLANEOUS) ×3 IMPLANT
BANDAGE ELASTIC 4 VELCRO ST LF (GAUZE/BANDAGES/DRESSINGS) ×1 IMPLANT
BIOPATCH RED 1 DISK 7.0 (GAUZE/BANDAGES/DRESSINGS) ×1 IMPLANT
CANISTER SUCTION 2500CC (MISCELLANEOUS) ×3 IMPLANT
CANNULA VESSEL 3MM 2 BLNT TIP (CANNULA) ×3 IMPLANT
CATH CANNON HEMO 15FR 23CM (HEMODIALYSIS SUPPLIES) ×1 IMPLANT
CLIP TI MEDIUM 6 (CLIP) ×3 IMPLANT
CLIP TI WIDE RED SMALL 6 (CLIP) ×3 IMPLANT
COVER PROBE W GEL 5X96 (DRAPES) IMPLANT
DECANTER SPIKE VIAL GLASS SM (MISCELLANEOUS) ×3 IMPLANT
DRAPE C-ARM 42X72 X-RAY (DRAPES) ×1 IMPLANT
DRAPE CHEST BREAST 15X10 FENES (DRAPES) ×1 IMPLANT
ELECT REM PT RETURN 9FT ADLT (ELECTROSURGICAL) ×3
ELECTRODE REM PT RTRN 9FT ADLT (ELECTROSURGICAL) ×2 IMPLANT
GAUZE SPONGE 2X2 8PLY STRL LF (GAUZE/BANDAGES/DRESSINGS) IMPLANT
GLOVE BIO SURGEON STRL SZ7.5 (GLOVE) ×3 IMPLANT
GLOVE BIOGEL PI IND STRL 6.5 (GLOVE) IMPLANT
GLOVE BIOGEL PI IND STRL 7.0 (GLOVE) IMPLANT
GLOVE BIOGEL PI IND STRL 8 (GLOVE) ×2 IMPLANT
GLOVE BIOGEL PI INDICATOR 6.5 (GLOVE) ×3
GLOVE BIOGEL PI INDICATOR 7.0 (GLOVE) ×1
GLOVE BIOGEL PI INDICATOR 8 (GLOVE) ×2
GLOVE ECLIPSE 6.5 STRL STRAW (GLOVE) ×2 IMPLANT
GOWN STRL REUS W/ TWL LRG LVL3 (GOWN DISPOSABLE) ×6 IMPLANT
GOWN STRL REUS W/TWL LRG LVL3 (GOWN DISPOSABLE) ×9
KIT BASIN OR (CUSTOM PROCEDURE TRAY) ×3 IMPLANT
KIT ROOM TURNOVER OR (KITS) ×3 IMPLANT
LIQUID BAND (GAUZE/BANDAGES/DRESSINGS) ×4 IMPLANT
NDL 18GX1X1/2 (RX/OR ONLY) (NEEDLE) IMPLANT
NEEDLE 18GX1X1/2 (RX/OR ONLY) (NEEDLE) ×3 IMPLANT
NS IRRIG 1000ML POUR BTL (IV SOLUTION) ×3 IMPLANT
PACK CV ACCESS (CUSTOM PROCEDURE TRAY) ×3 IMPLANT
PAD ARMBOARD 7.5X6 YLW CONV (MISCELLANEOUS) ×6 IMPLANT
SPONGE GAUZE 2X2 STER 10/PKG (GAUZE/BANDAGES/DRESSINGS) ×1
SPONGE GAUZE 4X4 12PLY STER LF (GAUZE/BANDAGES/DRESSINGS) ×1 IMPLANT
SUT ETHILON 3 0 PS 1 (SUTURE) ×1 IMPLANT
SUT PROLENE 5 0 C 1 24 (SUTURE) ×1 IMPLANT
SUT PROLENE 6 0 BV (SUTURE) ×3 IMPLANT
SUT VIC AB 3-0 SH 27 (SUTURE) ×3
SUT VIC AB 3-0 SH 27X BRD (SUTURE) ×2 IMPLANT
SUT VICRYL 4-0 PS2 18IN ABS (SUTURE) ×4 IMPLANT
SYR 5ML LL (SYRINGE) ×1 IMPLANT
SYRINGE 10CC LL (SYRINGE) ×1 IMPLANT
TAPE CLOTH SURG 4X10 WHT LF (GAUZE/BANDAGES/DRESSINGS) ×1 IMPLANT
UNDERPAD 30X30 INCONTINENT (UNDERPADS AND DIAPERS) ×3 IMPLANT
WATER STERILE IRR 1000ML POUR (IV SOLUTION) ×3 IMPLANT

## 2015-01-20 NOTE — Progress Notes (Signed)
S: breathing better O:BP 161/77 mmHg  Pulse 73  Temp(Src) 97.7 F (36.5 C) (Oral)  Resp 20  Ht 5\' 8"  (1.727 m)  Wt 131.09 kg (289 lb)  BMI 43.95 kg/m2  SpO2 100%  Intake/Output Summary (Last 24 hours) at 01/20/15 0808 Last data filed at 01/20/15 0536  Gross per 24 hour  Intake   1080 ml  Output    325 ml  Net    755 ml   Weight change: 0.693 kg (1 lb 8.4 oz) [id number] and alert CVS: RRR Resp:clear Abd:+ bs NTND Ext: RUA avf + bruit, swelling of rt arm with venous dilation. Lt BKA NEURO:CNI Ox3 no asterixis   . allopurinol  200 mg Oral Daily  . antiseptic oral rinse  7 mL Mouth Rinse BID  . aspirin  81 mg Oral Daily  . atorvastatin  10 mg Oral QPM  . calcium acetate  1,334 mg Oral TID WC  . docusate sodium  200 mg Oral TID  . famotidine  20 mg Oral Daily  . feeding supplement (NEPRO CARB STEADY)  237 mL Oral BID BM  . furosemide  80 mg Oral BID  . insulin pump   Subcutaneous 6 times per day  . levothyroxine  100 mcg Oral QAC breakfast  . loratadine  10 mg Oral Daily  . methylPREDNISolone  4 mg Oral BID WC   Followed by  . [START ON 01/21/2015] methylPREDNISolone  4 mg Oral Q breakfast  . metoCLOPramide  5 mg Oral TID PC & HS  . metoprolol succinate  12.5 mg Oral Q T,Th,S,Su  . multivitamin  1 tablet Oral QHS  . prasugrel  10 mg Oral Daily  . rOPINIRole  2 mg Oral Q supper  . sevelamer carbonate  1,600 mg Oral TID WC  . sodium chloride  3 mL Intravenous Q12H  . sodium chloride  3 mL Intravenous Q12H  . vancomycin  1,000 mg Intravenous 30 min Pre-Op   Dg Chest 2 View  01/18/2015   CLINICAL DATA:  Dyspnea since 01/17/2015.  EXAM: CHEST  2 VIEW  COMPARISON:  PA and lateral chest 09/30/2014 and 05/13/2014. CT chest 10/02/2014.  FINDINGS: There is cardiomegaly and interstitial pulmonary edema. No consolidative process, pneumothorax or effusion is identified.  IMPRESSION: Cardiomegaly and interstitial edema.   Electronically Signed   By: Inge Rise M.D.   On:  01/18/2015 16:19   BMET    Component Value Date/Time   NA 138 01/20/2015 0343   K 5.4* 01/20/2015 0343   CL 95* 01/20/2015 0343   CO2 30 01/20/2015 0343   GLUCOSE 134* 01/20/2015 0343   BUN 49* 01/20/2015 0343   CREATININE 7.31* 01/20/2015 0343   CALCIUM 8.9 01/20/2015 0343   GFRNONAA 8* 01/20/2015 0343   GFRAA 9* 01/20/2015 0343   CBC    Component Value Date/Time   WBC 8.6 01/20/2015 0343   RBC 3.32* 01/20/2015 0343   HGB 10.7* 01/20/2015 0343   HCT 34.5* 01/20/2015 0343   PLT 182 01/20/2015 0343   MCV 103.9* 01/20/2015 0343   MCH 32.2 01/20/2015 0343   MCHC 31.0 01/20/2015 0343   RDW 16.6* 01/20/2015 0343   LYMPHSABS 1.6 01/19/2015 0458   MONOABS 1.2* 01/19/2015 0458   EOSABS 1.1* 01/19/2015 0458   BASOSABS 0.1 01/19/2015 0458     Assessment: 1. pulm edema, improved 2. ESRD 3. CAD, stable 4. Central vein stenosis/compression R arm Plan: 1. HD today after AVF ligation and again tomorrow and then home  if does not need O2 2. Recheck CXR after HD today  Dale [name] T

## 2015-01-20 NOTE — Op Note (Signed)
    NAME: Dale [name]   MRN: [id number] DOB: 06/04/1965    DATE OF OPERATION: 01/20/2015  PREOP DIAGNOSIS: Stage V chronic kidney disease and venous hypertension right upper extremity  POSTOP DIAGNOSIS: Same  PROCEDURE: 1. Ultrasound-guided access for placement of right IJ 23 cm tunneled dialysis catheter 2. Ligation of right brachiocephalic fistula and excision of aneurysm  SURGEON: Judeth Cornfield. Scot Dock, MD, FACS  ASSIST: none  ANESTHESIA: Gen.   EBL: minimal  INDICATIONS: Frankin Wilkins is a 49 y.o. male With significant venous hypertension of the right upper extremity. I recommended ligation of the fistula and placement of a catheter for now.  FINDINGS: aneurysmal proximal fistula which I elected to excise. Patent right IJ.  TECHNIQUE:  The patient was taken to the operative room and received a general anesthetic. After careful positioning, the neck and upper chest were prepped and draped in the usual sterile fashion. Under ultrasound guidance, the right IJ was cannulated and a guidewire introduced into the right atrium under fluoroscopic control. The tract over the wire was dilated and the dilator and peel-away sheath advanced over the wire. The wire and dilator were then removed and the 23 cm catheter passed through the peel-away sheath and positioned in the superior vena cava. The exit site for the catheter was selected and the catheter was brought to the tunnel cut the appropriate length and the distal ports were attached. Both ports withdrew easily and flushed with heparinized saline and filled with concentrated heparin. The catheter was secured at its exit site with a 4-0 nylon suture. The IJ cannulation site was closed with a 4-0 subcuticular stitch. Sterile dressing was applied.  Attention was then turned to the right arm. The right upper extremity was prepped and draped in usual sterile fashion. An elliptical incision was made encompassing the large aneurysm just above the  antecubital level. The aneurysm was dissected free circumferentially and then the vein was ligated distally with 2 2-0 silk ties. At the proximal and the fistula was clamped above the anastomosis and the aneurysm excised.  The vein was then oversewn with a running 50 proline suture which was run up and back. It was good hemostasis. Hemostasis was obtained in the wound and the wound was closed with the plan of 3-0 Vicryl and the skin closed with 4-0 Vicryl. Sterile dressing was applied. Patient tolerated the procedure well and transferred to the recovery room in stable condition. All needle and sponge counts were correct.  [name], MD, FACS [facility]  DATE OF DICTATION:   01/20/2015

## 2015-01-20 NOTE — H&P (View-Only) (Signed)
  [facility] Progress Note  Informed of patient's admission today secondary to shortness. The patient is scheduled for ligation of his right arm graft and diatek catheter insertion tomorrow with Dr. Scot Dock. He underwent HD this morning and 3L were removed. The patient currently denies any shortness of breath.   For ligation of right arm fistula and insertion of tunneled dialysis catheter tomorrow 01/20/15 with Dr. Scot Dock. Obtain consent. NPO past midnight.    Virgina Jock, PA-C [facility] Office: [phone] Pager: [phone] 01/19/2015 2:36 PM  Agree with above.  [name], MD, [place] [phone] Office: [phone]

## 2015-01-20 NOTE — Progress Notes (Signed)
[facility]                                                                              Patient Demographics  Dale [name], is a 49 y.o. male, DOB - 1965/09/25, [id number]  Admit date - 01/18/2015   Admitting Physician Reubin Milan, MD  Outpatient Primary MD for the patient is Windy Kalata, MD  LOS - 2   Chief Complaint  Patient presents with  . Shortness of Breath   Interim summary 49 year old male with history of ESRD on hemodialysis, diastolic heart failure, hypothyroidism, hypertension, presented to the emergency department with complaints of shortness of breath. Patient was found to have acute dyspnea with pulmonary edema. Patient did dialyze on 01/19/2015. Planus dialyze today, 01/20/2015 and possibly 01/21/2015 with discharged thereafter. Patient is also going to undergo ligation of the left AV graft, right IJ catheter insertion by vascular surgery today.  Assessment & Plan   Acute dyspnea secondary to volume overload/pulmonary edema -Appears to be improving -Continue hemodialysis per nephrology -Patient currently is on room air  End-stage renal disease -Patient dialyzes Monday, Wednesday, Friday -Nephrology consulted and appreciated -HD today after surgery and 0000000  Chronic diastolic heart failure -Continue HD -Monitor output, intake, daily weights -Continue statin, metoprolol, aspirin  Anemia of chronic disease -Hemoglobin currently 10.7, continue to monitor CBC  Hypertension -Continue metoprolol   Coronary artery disease -Currently stable, no chest pain -Continue aspirin, Effient  IV contrast allergy -Continue Benadryl as needed along with Medrol tapering  Ligation of aVF and insertion of PermCath -Vascular surgery consulted and appreciated- OR 9/27  Diabetes mellitus  -Continue insulin pump  Hypothyroidism -Continue Synthroid  Code Status: Full  Family Communication: None at bedsiide  Disposition Plan:  Admitted. Patient may require extra HD.   Time Spent in minutes 30 minutes  Procedures  none  Consults  Nephrology Vascular surgery  DVT Prophylaxis SCDs  Lab Results  Component Value Date   PLT 182 01/20/2015    Medications  Scheduled Meds: . [MAR Hold] allopurinol  200 mg Oral Daily  . [MAR Hold] antiseptic oral rinse  7 mL Mouth Rinse BID  . [MAR Hold] aspirin  81 mg Oral Daily  . [MAR Hold] atorvastatin  10 mg Oral QPM  . [MAR Hold] calcium acetate  1,334 mg Oral TID WC  . [MAR Hold] docusate sodium  200 mg Oral TID  . [MAR Hold] famotidine  20 mg Oral Daily  . [MAR Hold] feeding supplement (NEPRO CARB STEADY)  237 mL Oral BID BM  . [MAR Hold] furosemide  80 mg Oral BID  . [MAR Hold] insulin pump   Subcutaneous 6 times per day  . [MAR Hold] levothyroxine  100 mcg Oral QAC breakfast  . [MAR Hold] loratadine  10 mg Oral Daily  . [MAR Hold] methylPREDNISolone  4 mg Oral BID WC   Followed by  . [MAR Hold] methylPREDNISolone  4 mg Oral Q breakfast  . [MAR Hold] metoCLOPramide  5 mg Oral TID PC & HS  . [MAR Hold] metoprolol succinate  12.5 mg Oral Q T,Th,S,Su  . [MAR Hold] multivitamin  1 tablet Oral QHS  . [MAR Hold] prasugrel  10 mg Oral Daily  . [MAR Hold] rOPINIRole  2 mg Oral Q supper  . [MAR Hold] sevelamer carbonate  1,600 mg Oral TID WC  . [MAR Hold] sodium chloride  3 mL Intravenous Q12H  . [MAR Hold] sodium chloride  3 mL Intravenous Q12H   Continuous Infusions:  PRN Meds:.[MAR Hold] sodium chloride, 0.9 % irrigation (POUR BTL), [MAR Hold] calcium acetate, [MAR Hold] camphor-menthol, [MAR Hold] diphenhydrAMINE, heparin 6000 unit irrigation, heparin, [MAR Hold]  HYDROmorphone (DILAUDID) injection, [MAR Hold]  HYDROmorphone (DILAUDID) injection, lidocaine, [MAR Hold] nitroGLYCERIN, [MAR Hold] ondansetron **OR** [MAR Hold] ondansetron (ZOFRAN) IV, [MAR Hold] promethazine, [MAR Hold] sodium chloride  Antibiotics    Anti-infectives    Start     Dose/Rate  Route Frequency Ordered Stop   01/20/15 0000  [MAR Hold]  vancomycin (VANCOCIN) IVPB 1000 mg/200 mL premix     (MAR Hold since 01/20/15 0900)   1,000 mg 200 mL/hr over 60 Minutes Intravenous 30 min pre-op 01/19/15 1449 01/20/15 [id number]      Subjective:   [name] seen and examined today.  Patient feels his breathing has improved. Denies any chest pain, dizziness, abdominal pain, nausea or vomiting. Continues to feel itchy from what he feels is his IV contrast he received weeks ago.  Objective:   Filed Vitals:   01/19/15 0844 01/19/15 1153 01/19/15 2100 01/20/15 0435  BP: 141/53 167/69 173/74 161/77  Pulse: 81 78 80 73  Temp: 98.6 F (37 C) 98.6 F (37 C) 97.9 F (36.6 C) 97.7 F (36.5 C)  TempSrc: Oral Oral Oral Oral  Resp: 20 20 20 20   Height:      Weight:    131.09 kg (289 lb)  SpO2: 93% 96% 94% 100%    Wt Readings from Last 3 Encounters:  01/20/15 131.09 kg (289 lb)  01/07/15 126 kg (277 lb 12.5 oz)  12/25/14 127.007 kg (280 lb)     Intake/Output Summary (Last 24 hours) at 01/20/15 1108 Last data filed at 01/20/15 1106  Gross per 24 hour  Intake   1140 ml  Output    335 ml  Net    805 ml    Exam  General: Well developed, well nourished, NAD, appears stated age  68: NCAT, mucous membranes moist.   Cardiovascular: S1 S2 auscultated, no rubs, murmurs or gallops. Regular rate and rhythm.  Respiratory: Clear to auscultation bilaterally with equal chest rise  Abdomen: Soft, obese, nontender, nondistended, + bowel sounds  Extremities: Left BKA, RLE +1 edema.  RUE edema, AVF +bruit  Neuro: AAOx3, nonfocal  Psych: Normal affect and demeanor   Data Review   Micro Results Recent Results (from the past 240 hour(s))  Surgical pcr screen     Status: None   Collection Time: 01/20/15  4:13 AM  Result Value Ref Range Status   MRSA, PCR NEGATIVE NEGATIVE Final   Staphylococcus aureus NEGATIVE NEGATIVE Final    Comment:        The Xpert SA Assay  (FDA approved for NASAL specimens in patients over 47 years of age), is one component of a comprehensive surveillance program.  Test performance has been validated by [facility] for patients greater than or equal to 53 year old. It is not intended to diagnose infection nor to guide or monitor treatment.     Radiology Reports Dg Chest 2 View  01/18/2015   CLINICAL DATA:  Dyspnea since 01/17/2015.  EXAM: CHEST  2 VIEW  COMPARISON:  PA and lateral  chest 09/30/2014 and 05/13/2014. CT chest 10/02/2014.  FINDINGS: There is cardiomegaly and interstitial pulmonary edema. No consolidative process, pneumothorax or effusion is identified.  IMPRESSION: Cardiomegaly and interstitial edema.   Electronically Signed   By: Inge Rise M.D.   On: 01/18/2015 16:19   Dg Fluoro Guide Cv Line-no Report  01/20/2015   CLINICAL DATA:    FLOURO GUIDE CV LINE  Fluoroscopy was utilized by the requesting physician.  No radiographic  interpretation.     CBC  Recent Labs Lab 01/18/15 1554 01/18/15 1559 01/19/15 0458 01/20/15 0343  WBC  --  9.5 9.3 8.6  HGB 11.9* 10.7* 10.1* 10.7*  HCT 35.0* 34.1* 32.2* 34.5*  PLT  --  229 205 182  MCV  --  103.3* 102.2* 103.9*  MCH  --  32.4 32.1 32.2  MCHC  --  31.4 31.4 31.0  RDW  --  16.6* 16.9* 16.6*  LYMPHSABS  --  1.8 1.6  --   MONOABS  --  0.9 1.2*  --   EOSABS  --  1.4* 1.1*  --   BASOSABS  --  0.1 0.1  --     Chemistries   Recent Labs Lab 01/18/15 1554 01/18/15 1657 01/19/15 0458 01/20/15 0343  NA 139 138 137 138  K 4.9 4.8 5.6* 5.4*  CL 103 98* 99* 95*  CO2  --  27 25 30   GLUCOSE 89 81 252* 134*  BUN 56* 58* 62* 49*  CREATININE 8.30* 8.97* 9.66* 7.31*  CALCIUM  --  8.9 8.4* 8.9  AST  --  20 16  --   ALT  --  18 18  --   ALKPHOS  --  170* 156*  --   BILITOT  --  0.6 0.6  --    ------------------------------------------------------------------------------------------------------------------ estimated creatinine clearance is 16.2  mL/min (by C-G formula based on Cr of 7.31). ------------------------------------------------------------------------------------------------------------------ No results for input(s): HGBA1C in the last 72 hours. ------------------------------------------------------------------------------------------------------------------ No results for input(s): CHOL, HDL, LDLCALC, TRIG, CHOLHDL, LDLDIRECT in the last 72 hours. ------------------------------------------------------------------------------------------------------------------ No results for input(s): TSH, T4TOTAL, T3FREE, THYROIDAB in the last 72 hours.  Invalid input(s): FREET3 ------------------------------------------------------------------------------------------------------------------ No results for input(s): VITAMINB12, FOLATE, FERRITIN, TIBC, IRON, RETICCTPCT in the last 72 hours.  Coagulation profile No results for input(s): INR, PROTIME in the last 168 hours.  No results for input(s): DDIMER in the last 72 hours.  Cardiac Enzymes No results for input(s): CKMB, TROPONINI, MYOGLOBIN in the last 168 hours.  Invalid input(s): CK ------------------------------------------------------------------------------------------------------------------ Invalid input(s): POCBNP    Braniya Farrugia D.O. on 01/20/2015 at 11:08 AM  Between 7am to 7pm - Pager - [phone]  After 7pm go to [email or web address] - password [email or web address]  And look for the night coverage person covering for me after hours  [facility] Office  [phone]

## 2015-01-20 NOTE — Anesthesia Postprocedure Evaluation (Signed)
  Anesthesia Post-op Note  Patient: Dale [name]  Procedure(s) Performed: Procedure(s): LIGATION OF RIGHT ARTERIOVENOUS FISTULA WITH EXCISION OF ANEURYSM (Left) INSERTION OF DIALYSIS CATHETER - RIGHT INTERNAL JUGULAR  (Right)  Patient Location: PACU  Anesthesia Type:General  Level of Consciousness: awake and alert   Airway and Oxygen Therapy: Patient Spontanous Breathing  Post-op Pain: none  Post-op Assessment: Post-op Vital signs reviewed              Post-op Vital Signs: Reviewed  Last Vitals:  Filed Vitals:   01/20/15 1333  BP: 166/70  Pulse: 78  Temp:   Resp:     Complications: No apparent anesthesia complications

## 2015-01-20 NOTE — Transfer of Care (Signed)
Immediate Anesthesia Transfer of Care Note  Patient: Dale [name]  Procedure(s) Performed: Procedure(s): LIGATION OF RIGHT ARTERIOVENOUS FISTULA WITH EXCISION OF ANEURYSM (Left) INSERTION OF DIALYSIS CATHETER - RIGHT INTERNAL JUGULAR  (Right)  Patient Location: PACU  Anesthesia Type:General  Level of Consciousness: awake, alert  and oriented  Airway & Oxygen Therapy: Patient Spontanous Breathing and Patient connected to face mask oxygen  Post-op Assessment: Report given to RN and Post -op Vital signs reviewed and stable  Post vital signs: Reviewed and stable  Last Vitals:  Filed Vitals:   01/20/15 1136  BP:   Pulse: 77  Temp: 36.7 C  Resp: 15    Complications: No apparent anesthesia complications

## 2015-01-20 NOTE — Anesthesia Preprocedure Evaluation (Addendum)
Anesthesia Evaluation  Patient identified by MRN, date of birth, ID band Patient awake    Reviewed: Allergy & Precautions, NPO status , Patient's Chart, lab work & pertinent test results, reviewed documented beta blocker date and time   History of Anesthesia Complications (+) DIFFICULT AIRWAYHistory of anesthetic complications: Has been intubated with MAC 4 without Glide and also with GLIDE 7.5 ETT.  Airway Mallampati: III  TM Distance: >3 FB Neck ROM: Full    Dental  (+) Dental Advisory Given, Teeth Intact   Pulmonary sleep apnea ,    breath sounds clear to auscultation       Cardiovascular hypertension, Pt. on medications and Pt. on home beta blockers + angina + CAD and +CHF   Rhythm:Regular Rate:Normal  DES RCA 03/2014, ECHO 08/2014 60%, EKG long QT   Neuro/Psych Anxiety negative neurological ROS     GI/Hepatic Neg liver ROS, GERD  Medicated,  Endo/Other  diabetes, Type 2, Insulin DependentHypothyroidism Morbid obesity  Renal/GU Dialysis and ESRFRenal diseaseSP transplant, now back on dialysis   K 5.4     Musculoskeletal  (+) Arthritis ,   Abdominal (+) + obese,   Peds  Hematology  (+) anemia , 10.7/ 34.5   Anesthesia Other Findings   Reproductive/Obstetrics                            Anesthesia Physical  Anesthesia Plan  ASA: III  Anesthesia Plan: General   Post-op Pain Management:    Induction: Intravenous  Airway Management Planned: Oral ETT and Video Laryngoscope Planned  Additional Equipment:   Intra-op Plan:   Post-operative Plan: Extubation in OR  Informed Consent: I have reviewed the patients History and Physical, chart, labs and discussed the procedure including the risks, benefits and alternatives for the proposed anesthesia with the patient or authorized representative who has indicated his/her understanding and acceptance.   Dental advisory given  Plan  Discussed with: CRNA  Anesthesia Plan Comments:         Anesthesia Quick Evaluation

## 2015-01-20 NOTE — Anesthesia Procedure Notes (Signed)
Procedure Name: Intubation Date/Time: 01/20/2015 9:51 AM Performed by: Clearnce Sorrel Pre-anesthesia Checklist: Patient identified, Timeout performed, Emergency Drugs available, Suction available and Patient being monitored Patient Re-evaluated:Patient Re-evaluated prior to inductionOxygen Delivery Method: Circle system utilized Preoxygenation: Pre-oxygenation with 100% oxygen Intubation Type: IV induction Ventilation: Oral airway inserted - appropriate to patient size, Two handed mask ventilation required and Mask ventilation without difficulty Laryngoscope Size: Glidescope and 4 (for anticipated difficulty) Grade View: Grade I Tube type: Oral Tube size: 7.5 mm Number of attempts: 1 Airway Equipment and Method: Stylet Placement Confirmation: ETT inserted through vocal cords under direct vision,  breath sounds checked- equal and bilateral and positive ETCO2 Secured at: 23 cm Tube secured with: Tape Dental Injury: Teeth and Oropharynx as per pre-operative assessment

## 2015-01-20 NOTE — Interval H&P Note (Signed)
History and Physical Interval Note:  01/20/2015 9:04 AM  Dale [name]  has presented today for surgery, with the diagnosis of End Stage Renal Disease  N18.6  The various methods of treatment have been discussed with the patient and family. After consideration of risks, benefits and other options for treatment, the patient has consented to  Procedure(s): LIGATION OF LEFT ARTERIOVENOUS GORTEX GRAFT, RIGHT IJ DIALYSIS CATHERER INSERTION (Left) as a surgical intervention .  [name] history has been reviewed, patient examined, no change in status, stable for surgery.  I have reviewed the patient's chart and labs.  Questions were answered to the patient's satisfaction.     [name]

## 2015-01-20 NOTE — Progress Notes (Signed)
Pt arrived to PACU with Insulin pump attached to his belly, report rec'd pt had turned off basal rate prior to surgery, CBG at this time 177.

## 2015-01-21 ENCOUNTER — Inpatient Hospital Stay (HOSPITAL_COMMUNITY): Payer: Medicare Other

## 2015-01-21 ENCOUNTER — Encounter (HOSPITAL_COMMUNITY): Payer: Self-pay | Admitting: Vascular Surgery

## 2015-01-21 ENCOUNTER — Telehealth: Payer: Self-pay | Admitting: Vascular Surgery

## 2015-01-21 DIAGNOSIS — J9601 Acute respiratory failure with hypoxia: Secondary | ICD-10-CM

## 2015-01-21 DIAGNOSIS — J810 Acute pulmonary edema: Secondary | ICD-10-CM

## 2015-01-21 DIAGNOSIS — E8770 Fluid overload, unspecified: Secondary | ICD-10-CM

## 2015-01-21 LAB — BASIC METABOLIC PANEL
Anion gap: 13 (ref 5–15)
BUN: 61 mg/dL — ABNORMAL HIGH (ref 6–20)
CO2: 27 mmol/L (ref 22–32)
Calcium: 8.4 mg/dL — ABNORMAL LOW (ref 8.9–10.3)
Chloride: 93 mmol/L — ABNORMAL LOW (ref 101–111)
Creatinine, Ser: 9.15 mg/dL — ABNORMAL HIGH (ref 0.61–1.24)
GFR calc Af Amer: 7 mL/min — ABNORMAL LOW (ref >60)
GFR calc non Af Amer: 6 mL/min — ABNORMAL LOW (ref >60)
Glucose, Bld: 159 mg/dL — ABNORMAL HIGH (ref 65–99)
Potassium: 6.2 mmol/L (ref 3.5–5.1)
Sodium: 133 mmol/L — ABNORMAL LOW (ref 135–145)

## 2015-01-21 LAB — GLUCOSE, CAPILLARY
Glucose-Capillary: 165 mg/dL — ABNORMAL HIGH (ref 65–99)
Glucose-Capillary: 120 mg/dL — ABNORMAL HIGH (ref 65–99)
Glucose-Capillary: 148 mg/dL — ABNORMAL HIGH (ref 65–99)
Glucose-Capillary: 153 mg/dL — ABNORMAL HIGH (ref 65–99)
Glucose-Capillary: 160 mg/dL — ABNORMAL HIGH (ref 65–99)
Glucose-Capillary: 120 mg/dL — ABNORMAL HIGH (ref 65–99)

## 2015-01-21 LAB — CBC
HCT: 33.7 % — ABNORMAL LOW (ref 39.0–52.0)
Hemoglobin: 10.1 g/dL — ABNORMAL LOW (ref 13.0–17.0)
MCH: 31.2 pg (ref 26.0–34.0)
MCHC: 30 g/dL (ref 30.0–36.0)
MCV: 104 fL — ABNORMAL HIGH (ref 78.0–100.0)
Platelets: 218 10*3/uL (ref 150–400)
RBC: 3.24 MIL/uL — ABNORMAL LOW (ref 4.22–5.81)
RDW: 16.4 % — ABNORMAL HIGH (ref 11.5–15.5)
WBC: 9.5 10*3/uL (ref 4.0–10.5)

## 2015-01-21 MED ORDER — DIPHENHYDRAMINE HCL 25 MG PO CAPS
ORAL_CAPSULE | ORAL | Status: DC
Start: 2015-01-21 — End: 2015-01-21
  Filled 2015-01-21: qty 1

## 2015-01-21 MED ORDER — DIPHENHYDRAMINE HCL 50 MG/ML IJ SOLN
INTRAMUSCULAR | Status: AC
  Filled 2015-01-21: qty 1

## 2015-01-21 MED ORDER — ACETAMINOPHEN 325 MG PO TABS
ORAL_TABLET | ORAL | Status: AC
  Filled 2015-01-21: qty 2

## 2015-01-21 MED ORDER — METOPROLOL SUCCINATE ER 25 MG PO TB24
12.5000 mg | ORAL_TABLET | Freq: Every day | ORAL | Status: DC
  Administered 2015-01-21 – 2015-01-22 (×2): 12.5 mg via ORAL
  Filled 2015-01-21 (×2): qty 1

## 2015-01-21 MED ORDER — HYDROMORPHONE HCL 1 MG/ML IJ SOLN
INTRAMUSCULAR | Status: AC
  Filled 2015-01-21: qty 1

## 2015-01-21 MED ORDER — ACETAMINOPHEN 325 MG PO TABS
650.0000 mg | ORAL_TABLET | Freq: Once | ORAL | Status: AC
  Administered 2015-01-21: 650 mg via ORAL

## 2015-01-21 NOTE — Telephone Encounter (Signed)
Patients wife answered, and was very abrupt and said that he was still in the hospital and we should have checked that before calling. I tried to explain that I would like to leave his follow up appointment with her, but she hung up on me.   I have tried to call the hospital, but no answer in his room, dpm

## 2015-01-21 NOTE — Progress Notes (Signed)
CRITICAL VALUE ALERT  Critical value received: Potassium= 6.2  Date of notification:  01/21/15  Time of notification:  0529  Critical value read back:Yes.    Nurse who received alert:  Kendrick Ranch  MD notified (1st page):  Tylene Fantasia  Time of first page:  0530  MD notified (2nd page):  Time of second page:  Responding MD:  Still awaiting  Time MD responded:  Still awaiting

## 2015-01-21 NOTE — Procedures (Signed)
Pt seen on HD.  Ap 180 vp 230  BFR 350.  Will try for 4L.  Will get CXR post HD and if no pulm edema and O2 sats ok off O2 then he could go later today.  SP ligation of AVF and new Rt PC.  Plan to place perm access when effient can be held in few months.

## 2015-01-21 NOTE — Progress Notes (Signed)
SATURATION QUALIFICATIONS: (This note is used to comply with regulatory documentation for home oxygen)  Patient Saturations on Room Air at Rest = 91%  Patient Saturations on Room Air while Ambulating = 88%  Patient Saturations on 2 Liters of oxygen while Ambulating = 94%  Please briefly explain why patient needs home oxygen: 

## 2015-01-21 NOTE — Telephone Encounter (Signed)
-----   Message from Mena Goes, RN sent at 01/20/2015 12:23 PM EDT ----- Regarding: Schedule   ----- Message -----    From: Angelia Mould, MD    Sent: 01/20/2015  11:48 AM      To: Vvs Charge Pool Subject: charge                                         PROCEDURE: 1. Ultrasound-guided access for placement of right IJ 23 cm tunneled dialysis catheter 2. Ligation of right brachiocephalic fistula and excision of aneurysm  SURGEON: Judeth Cornfield. Scot Dock, MD, FACS  ASSIST: none  He will need a follow up visit in 3-4 weeks to discuss new access in the left arm. Thank you. CD

## 2015-01-21 NOTE — Progress Notes (Signed)
PROGRESS NOTE  Dale [name] [id number] DOB: Aug 13, 1965 DOA: 01/18/2015 PCP: Windy Kalata, MD 49 year old male with history of ESRD on hemodialysis, diastolic heart failure, hypothyroidism, hypertension, presented to the emergency department with complaints of shortness of breath. Patient was found to have acute dyspnea with pulmonary edema. Patient did dialyze on 01/19/2015. Planus dialyze today, 01/20/2015 and possibly 01/21/2015 with discharged thereafter. Patient is also going to undergo ligation of the left AV graft, right IJ catheter insertion by vascular surgery today. Assessment/Plan: Acute respiratory failure with hypoxia -Stable on 2 L nasal cannula -Oxygen saturation of 80% with ambulation on room air -Recheck after dialysis 01/22/2015  Acute dyspnea secondary to volume overload/pulmonary edema -Improving clinically -Continue hemodialysis per nephrology -Patient currently is on room air--95% -01/21/2015 repeat ambulatory pulse ox--desaturated to 88% on room air -Case was discussed with nephrology, Dr. Braulio Bosch d/c, repeat HD 9/29 -01/21/2015 CXR, mild pulmonary vascular congestion  End-stage renal disease -Patient dialyzes Monday, Wednesday, Friday -Nephrology consulted and appreciated -last HD on [id number]  Chronic diastolic heart failure -Continue HD -Monitor output, intake, daily weights -Continue statin, metoprolol, aspirin -09/18/2014 echo-EF 0000000, grade 1 diastolic CHF, No WMA  Anemia of chronic disease/CKD -Hemoglobin currently 10.7, continue to monitor CBC -Baseline hemoglobin approximately 10  Hypertension -Continue metoprolol succinate--pt takes it daily  Coronary artery disease -Currently stable, no chest pain -Continue aspirin, Effient  IV contrast allergy -Continue Benadryl as needed along with Medrol tapering  Ligation of aVF and insertion of PermCath -Vascular surgery consulted and appreciated- OR 9/27  Diabetes  mellitus  -Continue insulin pump -09/17/2014 hemoglobin A1c 7.2  Hypothyroidism -Continue Synthroid     Family Communication:   Pt at beside Disposition Plan:   Home 9/29 if stable       Procedures/Studies: Dg Chest 2 View  01/18/2015   CLINICAL DATA:  Dyspnea since 01/17/2015.  EXAM: CHEST  2 VIEW  COMPARISON:  PA and lateral chest 09/30/2014 and 05/13/2014. CT chest 10/02/2014.  FINDINGS: There is cardiomegaly and interstitial pulmonary edema. No consolidative process, pneumothorax or effusion is identified.  IMPRESSION: Cardiomegaly and interstitial edema.   Electronically Signed   By: Inge Rise M.D.   On: 01/18/2015 16:19   Dg Chest Port 1 View  01/21/2015   CLINICAL DATA:  Post dialysis, new catheter placement  EXAM: PORTABLE CHEST 1 VIEW  COMPARISON:  01/18/2015  FINDINGS: There is mild bilateral interstitial prominence. There is no focal parenchymal opacity. There is no pleural effusion or pneumothorax. There is stable cardiomegaly. There is a new dual-lumen right-sided central venous catheter with the tip projecting over the cavoatrial junction.  The osseous structures are unremarkable.  IMPRESSION: Right-sided central venous catheter with the tip projecting over the cavoatrial junction.  Stable cardiomegaly with mild pulmonary vascular congestion.   Electronically Signed   By: Kathreen Devoid   On: 01/21/2015 12:47   Dg Fluoro Guide Cv Line-no Report  01/20/2015   CLINICAL DATA:    FLOURO GUIDE CV LINE  Fluoroscopy was utilized by the requesting physician.  No radiographic  interpretation.          Subjective: Patient is still having dyspnea on exertion but improved from the day of admission. Denies any fevers, chills, chest pain, shortness of breath at rest, nausea, vomiting, diarrhea, abdominal pain, dysuria.  Objective: Filed Vitals:   01/21/15 1000 01/21/15 1008 01/21/15 1058 01/21/15 1144  BP: 122/61 124/61 180/70 160/68  Pulse: 77 78 82 76  Temp:  98.2 F  (36.8 C)    TempSrc:  Oral    Resp: 17 18    Height:      Weight:  125.6 kg (276 lb 14.4 oz)    SpO2:  100%      Intake/Output Summary (Last 24 hours) at 01/21/15 1425 Last data filed at 01/21/15 1131  Gross per 24 hour  Intake    937 ml  Output   3300 ml  Net  -2363 ml   Weight change: 5.267 kg (11 lb 9.8 oz) Exam:   General:  Pt is alert, follows commands appropriately, not in acute distress  HEENT: No icterus, No thrush, No neck mass, [place]/AT  Cardiovascular: RRR, S1/S2, no rubs, no gallops  Respiratory: Fine bibasilar crackles. No wheezing.  Abdomen: Soft/+BS, non tender, non distended, no guarding  Extremities: 1+ LE edema, No lymphangitis, No petechiae, No rashes, no synovitis  Data Reviewed: Basic Metabolic Panel:  Recent Labs Lab 01/18/15 1554 01/18/15 1657 01/19/15 0458 01/20/15 0343 01/21/15 0408  NA 139 138 137 138 133*  K 4.9 4.8 5.6* 5.4* 6.2*  CL 103 98* 99* 95* 93*  CO2  --  27 25 30 27   GLUCOSE 89 81 252* 134* 159*  BUN 56* 58* 62* 49* 61*  CREATININE 8.30* 8.97* 9.66* 7.31* 9.15*  CALCIUM  --  8.9 8.4* 8.9 8.4*   Liver Function Tests:  Recent Labs Lab 01/18/15 1657 01/19/15 0458  AST 20 16  ALT 18 18  ALKPHOS 170* 156*  BILITOT 0.6 0.6  PROT 7.2 7.1  ALBUMIN 3.4* 3.2*   No results for input(s): LIPASE, AMYLASE in the last 168 hours. No results for input(s): AMMONIA in the last 168 hours. CBC:  Recent Labs Lab 01/18/15 1554 01/18/15 1559 01/19/15 0458 01/20/15 0343 01/21/15 0408  WBC  --  9.5 9.3 8.6 9.5  NEUTROABS  --  5.3 5.3  --   --   HGB 11.9* 10.7* 10.1* 10.7* 10.1*  HCT 35.0* 34.1* 32.2* 34.5* 33.7*  MCV  --  103.3* 102.2* 103.9* 104.0*  PLT  --  229 205 182 218   Cardiac Enzymes: No results for input(s): CKTOTAL, CKMB, CKMBINDEX, TROPONINI in the last 168 hours. BNP: Invalid input(s): POCBNP CBG:  Recent Labs Lab 01/20/15 1624 01/20/15 1956 01/20/15 2352 01/21/15 0448 01/21/15 0905  GLUCAP 170* 80 91  165* 120*    Recent Results (from the past 240 hour(s))  Surgical pcr screen     Status: None   Collection Time: 01/20/15  4:13 AM  Result Value Ref Range Status   MRSA, PCR NEGATIVE NEGATIVE Final   Staphylococcus aureus NEGATIVE NEGATIVE Final    Comment:        The Xpert SA Assay (FDA approved for NASAL specimens in patients over 4 years of age), is one component of a comprehensive surveillance program.  Test performance has been validated by [facility] for patients greater than or equal to 32 year old. It is not intended to diagnose infection nor to guide or monitor treatment.      Scheduled Meds: . allopurinol  200 mg Oral Daily  . aspirin  81 mg Oral Daily  . atorvastatin  10 mg Oral QPM  . calcium acetate  1,334 mg Oral TID WC  . docusate sodium  200 mg Oral TID  . famotidine  20 mg Oral Daily  . feeding supplement (NEPRO CARB STEADY)  237 mL Oral BID BM  . furosemide  80 mg  Oral BID  . insulin pump   Subcutaneous 6 times per day  . levothyroxine  100 mcg Oral QAC breakfast  . loratadine  10 mg Oral Daily  . methylPREDNISolone  4 mg Oral Q breakfast  . metoCLOPramide  5 mg Oral TID PC & HS  . metoprolol succinate  12.5 mg Oral Daily  . multivitamin  1 tablet Oral QHS  . prasugrel  10 mg Oral Daily  . rOPINIRole  2 mg Oral Q supper  . sevelamer carbonate  1,600 mg Oral TID WC  . sodium chloride  3 mL Intravenous Q12H  . sodium chloride  3 mL Intravenous Q12H   Continuous Infusions:    Juliene Kirsh, DO  [facility] Pager [phone]  If 7PM-7AM, please contact night-coverage [email or web address] Password [email or web address] 01/21/2015, 2:25 PM   LOS: 3 days

## 2015-01-22 DIAGNOSIS — J810 Acute pulmonary edema: Secondary | ICD-10-CM

## 2015-01-22 LAB — GLUCOSE, CAPILLARY
Glucose-Capillary: 63 mg/dL — ABNORMAL LOW (ref 65–99)
Glucose-Capillary: 96 mg/dL (ref 65–99)
Glucose-Capillary: 162 mg/dL — ABNORMAL HIGH (ref 65–99)
Glucose-Capillary: 214 mg/dL — ABNORMAL HIGH (ref 65–99)

## 2015-01-22 LAB — CBC
HCT: 31.2 % — ABNORMAL LOW (ref 39.0–52.0)
Hemoglobin: 9.4 g/dL — ABNORMAL LOW (ref 13.0–17.0)
MCH: 31.2 pg (ref 26.0–34.0)
MCHC: 30.1 g/dL (ref 30.0–36.0)
MCV: 103.7 fL — ABNORMAL HIGH (ref 78.0–100.0)
Platelets: 164 10*3/uL (ref 150–400)
RBC: 3.01 MIL/uL — ABNORMAL LOW (ref 4.22–5.81)
RDW: 16.1 % — ABNORMAL HIGH (ref 11.5–15.5)
WBC: 7 10*3/uL (ref 4.0–10.5)

## 2015-01-22 LAB — BASIC METABOLIC PANEL
Anion gap: 11 (ref 5–15)
BUN: 41 mg/dL — ABNORMAL HIGH (ref 6–20)
CO2: 28 mmol/L (ref 22–32)
Calcium: 8.2 mg/dL — ABNORMAL LOW (ref 8.9–10.3)
Chloride: 95 mmol/L — ABNORMAL LOW (ref 101–111)
Creatinine, Ser: 6.61 mg/dL — ABNORMAL HIGH (ref 0.61–1.24)
GFR calc Af Amer: 10 mL/min — ABNORMAL LOW (ref >60)
GFR calc non Af Amer: 9 mL/min — ABNORMAL LOW (ref >60)
Glucose, Bld: 287 mg/dL — ABNORMAL HIGH (ref 65–99)
Potassium: 4.6 mmol/L (ref 3.5–5.1)
Sodium: 134 mmol/L — ABNORMAL LOW (ref 135–145)

## 2015-01-22 MED ORDER — HYDROMORPHONE HCL 1 MG/ML IJ SOLN
INTRAMUSCULAR | Status: AC
  Filled 2015-01-22: qty 1

## 2015-01-22 MED ORDER — DIPHENHYDRAMINE HCL 50 MG/ML IJ SOLN
INTRAMUSCULAR | Status: AC
Start: 2015-01-22 — End: 2015-01-22
  Filled 2015-01-22: qty 1

## 2015-01-22 NOTE — Discharge Summary (Signed)
Physician Discharge Summary  Dale [name] [id number] DOB: [birth year] DOA: 01/18/2015  PCP: Windy Kalata, MD  Admit date: 01/18/2015 Discharge date: 01/22/2015  Recommendations for Outpatient Follow-up:  1. Pt will need to follow up with PCP in 2 weeks post discharge Discharge Diagnoses:   Acute respiratory failure with hypoxia -Stable on 2 L nasal cannula -Oxygen saturation of 80% with ambulation on room air -Recheck after dialysis 01/22/2015--no oxygen desaturation with ambulation  Acute dyspnea secondary to volume overload/pulmonary edema -Improving clinically -Continue hemodialysis per nephrology -Patient currently is on room air--95% -01/21/2015 repeat ambulatory pulse ox--desaturated to 88% on room air -Case was discussed with nephrology, Dr. Braulio Bosch d/c, repeat HD 9/29 -01/21/2015 CXR, mild pulmonary vascular congestion -After dialysis on 01/22/2015, the patient's oxygen saturation improved, and the patient was less dyspneic. Ambulatory pulse oximetry was obtained and did not reveal any desaturation.  End-stage renal disease -Patient dialyzes Monday, Wednesday, Friday -Nephrology consulted and appreciated -last HD on [id number]  Chronic diastolic heart failure -Continue HD -Monitor output, intake, daily weights -Continue statin, metoprolol, aspirin -09/18/2014 echo-EF 0000000, grade 1 diastolic CHF, No WMA  Anemia of chronic disease/CKD -Hemoglobin currently 10.7, continue to monitor CBC -Baseline hemoglobin approximately 10  Hypertension -Continue metoprolol succinate--pt takes it daily  Coronary artery disease -Currently stable, no chest pain -Continue aspirin, Effient  IV contrast allergy -Continue Benadryl as needed along with Medrol tapering  Ligation of aVF and insertion of PermCath -Vascular surgery consulted and appreciated- OR 9/27  Diabetes mellitus  -Continue insulin pump -09/17/2014 hemoglobin A1c  7.2  Hypothyroidism -Continue Synthroid Discharge Condition: stable  Disposition: home  Diet:renal with 1200 fluid restriction Wt Readings from Last 3 Encounters:  01/22/15 124.6 kg (274 lb 11.1 oz)  01/07/15 126 kg (277 lb 12.5 oz)  12/25/14 127.007 kg (280 lb)    History of present illness:  49 year old male with history of ESRD on hemodialysis, diastolic heart failure, hypothyroidism, hypertension, presented to the emergency department with complaints of shortness of breath. Patient was found to have acute dyspnea with pulmonary edema. Patient did dialyze daily starting on  01/19/2015. Patient is also going to undergo ligation of the left AV graft, right IJ catheter insertion by vascular surgery 9/27.  After consecutive days of dialysis, the patient clinically improved. Initially, the patient did have oxygen desaturation with ambulation. However with continued dialysis, Pulse oximetry with ambulation did not show any oxygen desaturation. The patient remained afebrile and hemodynamically stable.  Consultants: nephrology  Discharge Exam: Filed Vitals:   01/22/15 1222  BP: 152/58  Pulse:   Temp:   Resp:    Filed Vitals:   01/22/15 1030 01/22/15 1100 01/22/15 1154 01/22/15 1222  BP: 117/56 123/63 121/53 152/58  Pulse: 77 77 79   Temp:   98.1 F (36.7 C)   TempSrc:   Oral   Resp: 20 15 16    Height:      Weight:   124.6 kg (274 lb 11.1 oz)   SpO2:   100% 100%   General: A&O x 3, NAD, pleasant, cooperative Cardiovascular: RRR, no rub, no gallop, no S3 Respiratory: CTAB, no wheeze, no rhonchi Abdomen:soft, nontender, nondistended, positive bowel sounds   Discharge Instructions      Discharge Instructions    Diet - low sodium heart healthy    Complete by:  As directed      Increase activity slowly    Complete by:  As directed  Medication List    STOP taking these medications        methylPREDNISolone 4 MG Tbpk tablet  Commonly known as:  MEDROL  DOSEPAK      TAKE these medications        allopurinol 100 MG tablet  Commonly known as:  ZYLOPRIM  Take 200 mg by mouth daily.     aspirin 81 MG tablet  Take 81 mg by mouth every evening.     atorvastatin 10 MG tablet  Commonly known as:  LIPITOR  Take 10 mg by mouth every evening.     calcium acetate 667 MG capsule  Commonly known as:  PHOSLO  Take 667-1,334 mg by mouth See admin instructions. Take 1,334 mg (2 capsules) with meals and 667 (1 capsule) with snacks     diphenhydrAMINE 25 mg capsule  Commonly known as:  BENADRYL  Take 1-2 capsules (25-50 mg total) by mouth every 6 (six) hours as needed for itching.     docusate sodium 100 MG capsule  Commonly known as:  COLACE  Take 200 mg by mouth 3 (three) times daily.     EFFIENT 10 MG Tabs tablet  Generic drug:  prasugrel  TAKE 1 TABLET BY MOUTH EVERY DAY     furosemide 80 MG tablet  Commonly known as:  LASIX  Take 80 mg by mouth 2 (two) times daily.     GLUCAGON EMERGENCY 1 MG injection  Generic drug:  glucagon  Inject 1 mg into the muscle daily as needed (low blood sugar).     hydrocortisone 2.5 % cream  APPLY 2-3 TIMES DAILY AS NEEDED TO AFFECTED AREA(S).     HYDROmorphone 2 MG tablet  Commonly known as:  DILAUDID  Take 1 tablet (2 mg total) by mouth 3 (three) times daily as needed for severe pain.     insulin pump Soln  Inject into the skin continuous. Humalog     levothyroxine 100 MCG tablet  Commonly known as:  SYNTHROID, LEVOTHROID  Take 100 mcg by mouth daily before breakfast.     loratadine 10 MG tablet  Commonly known as:  CLARITIN  Take 10 mg by mouth daily.     metoCLOPramide 5 MG tablet  Commonly known as:  REGLAN  Take 5 mg by mouth 4 (four) times daily - after meals and at bedtime.     metoprolol succinate 25 MG 24 hr tablet  Commonly known as:  TOPROL-XL  Take 0.5 tablets (12.5 mg total) by mouth as directed. Take 12.5 mg on Tu, Th, Sat and Sun; HOLD on dialysis days M, W, F      multivitamin Tabs tablet  Take 1 tablet by mouth daily.     nitroGLYCERIN 0.4 MG SL tablet  Commonly known as:  NITROSTAT  Place 0.4 mg under the tongue every 5 (five) minutes as needed for chest pain.     promethazine 25 MG tablet  Commonly known as:  PHENERGAN  Take 1 tablet (25 mg total) by mouth every 6 (six) hours as needed for nausea or vomiting. For nausea     ranitidine 150 MG tablet  Commonly known as:  ZANTAC  Take 150 mg by mouth 2 (two) times daily.     rOPINIRole 2 MG tablet  Commonly known as:  REQUIP  Take 2 mg by mouth daily with supper.     sevelamer carbonate 800 MG tablet  Commonly known as:  RENVELA  Take 1,600 mg by mouth 3 (three) times  daily with meals.         The results of significant diagnostics from this hospitalization (including imaging, microbiology, ancillary and laboratory) are listed below for reference.    Significant Diagnostic Studies: Dg Chest 2 View  01/18/2015   CLINICAL DATA:  Dyspnea since 01/17/2015.  EXAM: CHEST  2 VIEW  COMPARISON:  PA and lateral chest 09/30/2014 and 05/13/2014. CT chest 10/02/2014.  FINDINGS: There is cardiomegaly and interstitial pulmonary edema. No consolidative process, pneumothorax or effusion is identified.  IMPRESSION: Cardiomegaly and interstitial edema.   Electronically Signed   By: Inge Rise M.D.   On: 01/18/2015 16:19   Dg Chest Port 1 View  01/21/2015   CLINICAL DATA:  Post dialysis, new catheter placement  EXAM: PORTABLE CHEST 1 VIEW  COMPARISON:  01/18/2015  FINDINGS: There is mild bilateral interstitial prominence. There is no focal parenchymal opacity. There is no pleural effusion or pneumothorax. There is stable cardiomegaly. There is a new dual-lumen right-sided central venous catheter with the tip projecting over the cavoatrial junction.  The osseous structures are unremarkable.  IMPRESSION: Right-sided central venous catheter with the tip projecting over the cavoatrial junction.  Stable  cardiomegaly with mild pulmonary vascular congestion.   Electronically Signed   By: Kathreen Devoid   On: 01/21/2015 12:47   Dg Fluoro Guide Cv Line-no Report  01/20/2015   CLINICAL DATA:    FLOURO GUIDE CV LINE  Fluoroscopy was utilized by the requesting physician.  No radiographic  interpretation.      Microbiology: Recent Results (from the past 240 hour(s))  Surgical pcr screen     Status: None   Collection Time: 01/20/15  4:13 AM  Result Value Ref Range Status   MRSA, PCR NEGATIVE NEGATIVE Final   Staphylococcus aureus NEGATIVE NEGATIVE Final    Comment:        The Xpert SA Assay (FDA approved for NASAL specimens in patients over 22 years of age), is one component of a comprehensive surveillance program.  Test performance has been validated by [facility] for patients greater than or equal to 47 year old. It is not intended to diagnose infection nor to guide or monitor treatment.      Labs: Basic Metabolic Panel:  Recent Labs Lab 01/18/15 1657 01/19/15 0458 01/20/15 0343 01/21/15 0408 01/22/15 0811  NA 138 137 138 133* 134*  K 4.8 5.6* 5.4* 6.2* 4.6  CL 98* 99* 95* 93* 95*  CO2 27 25 30 27 28   GLUCOSE 81 252* 134* 159* 287*  BUN 58* 62* 49* 61* 41*  CREATININE 8.97* 9.66* 7.31* 9.15* 6.61*  CALCIUM 8.9 8.4* 8.9 8.4* 8.2*   Liver Function Tests:  Recent Labs Lab 01/18/15 1657 01/19/15 0458  AST 20 16  ALT 18 18  ALKPHOS 170* 156*  BILITOT 0.6 0.6  PROT 7.2 7.1  ALBUMIN 3.4* 3.2*   No results for input(s): LIPASE, AMYLASE in the last 168 hours. No results for input(s): AMMONIA in the last 168 hours. CBC:  Recent Labs Lab 01/18/15 1559 01/19/15 0458 01/20/15 0343 01/21/15 0408 01/22/15 0811  WBC 9.5 9.3 8.6 9.5 7.0  NEUTROABS 5.3 5.3  --   --   --   HGB 10.7* 10.1* 10.7* 10.1* 9.4*  HCT 34.1* 32.2* 34.5* 33.7* 31.2*  MCV 103.3* 102.2* 103.9* 104.0* 103.7*  PLT 229 205 182 218 164   Cardiac Enzymes: No results for input(s): CKTOTAL, CKMB,  CKMBINDEX, TROPONINI in the last 168 hours. BNP: Invalid input(s): POCBNP CBG:  Recent Labs Lab 01/21/15 2321 01/22/15 0141 01/22/15 0233 01/22/15 0341 01/22/15 0820  GLUCAP 120* 63* 96 162* 214*    Time coordinating discharge:  Greater than 30 minutes  Signed:  Jalien Weakland, DO [facility] Pager: [phone] 01/22/2015, 2:04 PM

## 2015-01-22 NOTE — Progress Notes (Signed)
Pt ambulated in hallway with walker and on room  Air. Sats started at 91-92% and increased to 94%

## 2015-01-22 NOTE — Progress Notes (Signed)
S: Pt seen on HD.  Feels "warm"  SBP 156.  BFR reduced to 300. Ap 100 Vp 80. O:BP 147/61 mmHg  Pulse 78  Temp(Src) 98 F (36.7 C) (Oral)  Resp 18  Ht 5\' 8"  (1.727 m)  Wt 129.2 kg (284 lb 13.4 oz)  BMI 43.32 kg/m2  SpO2 98%  Intake/Output Summary (Last 24 hours) at 01/22/15 0744 Last data filed at 01/22/15 0200  Gross per 24 hour  Intake   1809 ml  Output   3201 ml  Net  -1392 ml   Weight change: -7.368 kg (-16 lb 3.9 oz) [id number] and alert CVS: RRR Resp:clear Abd:+ bs NTND Ext: Ligated RUA AVF. Lt BKA NEURO:CNI Ox3 no asterixis Rt PC   . allopurinol  200 mg Oral Daily  . aspirin  81 mg Oral Daily  . atorvastatin  10 mg Oral QPM  . calcium acetate  1,334 mg Oral TID WC  . docusate sodium  200 mg Oral TID  . famotidine  20 mg Oral Daily  . feeding supplement (NEPRO CARB STEADY)  237 mL Oral BID BM  . furosemide  80 mg Oral BID  . insulin pump   Subcutaneous 6 times per day  . levothyroxine  100 mcg Oral QAC breakfast  . loratadine  10 mg Oral Daily  . metoCLOPramide  5 mg Oral TID PC & HS  . metoprolol succinate  12.5 mg Oral Daily  . multivitamin  1 tablet Oral QHS  . prasugrel  10 mg Oral Daily  . rOPINIRole  2 mg Oral Q supper  . sevelamer carbonate  1,600 mg Oral TID WC  . sodium chloride  3 mL Intravenous Q12H  . sodium chloride  3 mL Intravenous Q12H   Dg Chest Port 1 View  01/21/2015   CLINICAL DATA:  Post dialysis, new catheter placement  EXAM: PORTABLE CHEST 1 VIEW  COMPARISON:  01/18/2015  FINDINGS: There is mild bilateral interstitial prominence. There is no focal parenchymal opacity. There is no pleural effusion or pneumothorax. There is stable cardiomegaly. There is a new dual-lumen right-sided central venous catheter with the tip projecting over the cavoatrial junction.  The osseous structures are unremarkable.  IMPRESSION: Right-sided central venous catheter with the tip projecting over the cavoatrial junction.  Stable cardiomegaly with mild pulmonary  vascular congestion.   Electronically Signed   By: Kathreen Devoid   On: 01/21/2015 12:47   Dg Fluoro Guide Cv Line-no Report  01/20/2015   CLINICAL DATA:    FLOURO GUIDE CV LINE  Fluoroscopy was utilized by the requesting physician.  No radiographic  interpretation.    BMET    Component Value Date/Time   NA 133* 01/21/2015 0408   K 6.2* 01/21/2015 0408   CL 93* 01/21/2015 0408   CO2 27 01/21/2015 0408   GLUCOSE 159* 01/21/2015 0408   BUN 61* 01/21/2015 0408   CREATININE 9.15* 01/21/2015 0408   CALCIUM 8.4* 01/21/2015 0408   GFRNONAA 6* 01/21/2015 0408   GFRAA 7* 01/21/2015 0408   CBC    Component Value Date/Time   WBC 9.5 01/21/2015 0408   RBC 3.24* 01/21/2015 0408   HGB 10.1* 01/21/2015 0408   HCT 33.7* 01/21/2015 0408   PLT 218 01/21/2015 0408   MCV 104.0* 01/21/2015 0408   MCH 31.2 01/21/2015 0408   MCHC 30.0 01/21/2015 0408   RDW 16.4* 01/21/2015 0408   LYMPHSABS 1.6 01/19/2015 0458   MONOABS 1.2* 01/19/2015 0458   EOSABS 1.1* 01/19/2015 [id number]  BASOSABS 0.1 01/19/2015 0458     Assessment: 1. pulm edema, improved 2. ESRD SP ligation of RUA AVF due to central compression and swelling Rt arm. 3. CAD, stable 4. DM Plan: 1. HD today and if breathing well and O2 sats OK with ambulation then could be DC'd  Emersen Carroll T

## 2015-01-22 NOTE — Progress Notes (Signed)
Pt called RN concerned that his blood sugar was low. CBG = 63 at this time. Snack given. Pt has now turned the basal rate on his insulin pump off for 2.5 hours. Will continue to monitor.

## 2015-01-22 NOTE — Progress Notes (Signed)
   VASCULAR SURGERY ASSESSMENT & PLAN:  * 2 Days Post-Op s/p: ligation of right AVF and Diatek  *  Vascular will be available as needed.  SUBJECTIVE: No complaints.  PHYSICAL EXAM: Filed Vitals:   01/21/15 1230 01/21/15 1534 01/21/15 2019 01/22/15 0353  BP: 168/62 150/64 153/56 147/61  Pulse: 81 81 81 78  Temp: 98.4 F (36.9 C)  98.1 F (36.7 C) 98 F (36.7 C)  TempSrc: Oral  Oral Oral  Resp: 81  18 18  Height:      Weight:    284 lb 13.4 oz (129.2 kg)  SpO2: 98%  98% 98%   Right arm swelling better   LABS: Lab Results  Component Value Date   WBC 9.5 01/21/2015   HGB 10.1* 01/21/2015   HCT 33.7* 01/21/2015   MCV 104.0* 01/21/2015   PLT 218 01/21/2015   Lab Results  Component Value Date   CREATININE 9.15* 01/21/2015   Lab Results  Component Value Date   INR 1.14 11/20/2014   CBG (last 3)   Recent Labs  01/22/15 0141 01/22/15 0233 01/22/15 0341  GLUCAP 63* 96 162*    Principal Problem:   Volume overload Active Problems:   History of renal transplant   Chronic diastolic CHF (congestive heart failure)   Anemia   ESRD (end stage renal disease)   Acute respiratory failure with hypoxia   HTN (hypertension)   CAD (coronary artery disease)   Allergic reaction to dye   Acute dyspnea   Acute pulmonary edema    Gae Gallop Beeper: [id number] 01/22/2015

## 2015-01-22 NOTE — Care Management Important Message (Signed)
Important Message  Patient Details  Name: Dale [name] MRN: [id number] Date of Birth: 17-Oct-1965   Medicare Important Message Given:  Yes-second notification given    Delorse Lek 01/22/2015, 11:54 AM

## 2015-01-24 ENCOUNTER — Telehealth: Payer: Self-pay | Admitting: Vascular Surgery

## 2015-01-24 NOTE — Telephone Encounter (Signed)
-----   Message from Angelia Mould, MD sent at 01/22/2015  7:50 AM EDT ----- Regarding: f/u and list He can be taken off of the list. He needs a f./u apt in 3 weeks to discuss new access in left arm. Will need vein map of left arm, as I cannot find one on EPIC. thanks CD

## 2015-01-28 ENCOUNTER — Ambulatory Visit: Payer: Medicare Other | Admitting: Vascular Surgery

## 2015-02-12 ENCOUNTER — Other Ambulatory Visit: Payer: Self-pay | Admitting: *Deleted

## 2015-02-12 DIAGNOSIS — N186 End stage renal disease: Secondary | ICD-10-CM

## 2015-02-13 ENCOUNTER — Other Ambulatory Visit: Payer: Self-pay | Admitting: Vascular Surgery

## 2015-02-13 ENCOUNTER — Ambulatory Visit (HOSPITAL_COMMUNITY)
Admit: 2015-02-13 | Discharge: 2015-02-13 | Disposition: A | Discharge status: Home / Self Care | Payer: Medicare Other | Source: Ambulatory Visit | Attending: Vascular Surgery | Admitting: Vascular Surgery

## 2015-02-13 ENCOUNTER — Encounter: Payer: Self-pay | Admitting: Vascular Surgery

## 2015-02-13 DIAGNOSIS — Z01810 Encounter for preprocedural cardiovascular examination: Secondary | ICD-10-CM | POA: Diagnosis present

## 2015-02-13 DIAGNOSIS — N186 End stage renal disease: Secondary | ICD-10-CM | POA: Insufficient documentation

## 2015-02-18 ENCOUNTER — Telehealth: Payer: Self-pay | Admitting: Vascular Surgery

## 2015-02-18 ENCOUNTER — Encounter: Payer: Self-pay | Admitting: Vascular Surgery

## 2015-02-18 ENCOUNTER — Encounter: Payer: Medicare Other | Admitting: Vascular Surgery

## 2015-02-18 ENCOUNTER — Ambulatory Visit (INDEPENDENT_AMBULATORY_CARE_PROVIDER_SITE_OTHER): Payer: Medicare Other | Admitting: Vascular Surgery

## 2015-02-18 VITALS — BP 133/63 | HR 86 | Ht 68.0 in | Wt 274.0 lb

## 2015-02-18 DIAGNOSIS — N186 End stage renal disease: Secondary | ICD-10-CM

## 2015-02-18 DIAGNOSIS — Z992 Dependence on renal dialysis: Secondary | ICD-10-CM

## 2015-02-18 NOTE — Telephone Encounter (Signed)
Spoke with pt. Dr. Camillia Herter office wants to see him before clearing him for surgery. Appt made for 02/19/15 at 8:15 am. Pt already has appt tomorrow morning.  Per pt, will call when he gets home and reschedule this appt.

## 2015-02-18 NOTE — Progress Notes (Signed)
Brief History and Physical  History of Present Illness  Dale [name] is a 49 y.o. male who presents with chief complaint: ESRD.  The patient presents today for F/U after Ligation of right brachiocephalic fistula and excision of aneurysm and placement of Right IJ catheter.  He has HD on M-W-F.    He had a venogram performed by Dr. Bridgett Larsson secondary to previous UE swelling and to r/o subclavian stenosis. FINDING(S): 12/25/2014 by Dr. Bridgett Larsson 1. Patent left innominate vein, subclavian vein, and axillary vein 2. Patent left basilic vein which appears large enough for use in transposition He has no new medical complaints or medication changes since his last procedure.  His cardiologist Dr. [name] does not want him off his Effient until Jan.  We will work with him to help coordinate the next procedure.    Past Medical History  Diagnosis Date  . Hypertension   . Anemia of chronic disease   . Cholecystitis     a. 08/27/2011  . Constipation   . Pericardial effusion     a.  Small by CT 08/22/11;  b.  Large by CT 08/27/11  . Inguinal lymphadenopathy     a. bilateral - s/p biopsy 07/2011  . Fibromyalgia   . Chronic diastolic CHF (congestive heart failure) ([place])     a. Echo 3/13:  EF 55-60%;  b. Echo (11/15):  Mild LVH, EF 60-65%, no RWMA, Gr 1 DD, MAC, mild LAE, normal RVF, mild RAE;  c.  Echo 5/16:  severe LVH, EF 55-60%, no RWMA, Gr 1 DD, MAC, mild LAE  . HLD (hyperlipidemia)   . Hypothyroidism   . GERD (gastroesophageal reflux disease)   . CAD (coronary artery disease)     a. Lexiscan Myoview (11/15):  Inf, apical cap and apical lateral ischemia, EF 56%, inf HK, Moderate Risk;  b. LHC (11/15):  mid to dist Dx 80%, mid RCA 99% (functional CTO), dist RCA 80% >> PCI: balloon angioplasty to mid RCA (could not deliver stent) - plan staged PCI with HSRA of RCA; Dx to be tx medically >> PCI: Rotoblator atherectomy/DES to [facility]  . OSA (obstructive sleep apnea)     adjustable bed  . Shortness of breath  dyspnea     too much Fluid  . Pneumonia ~ 2007  . Insulin dependent diabetes mellitus (HCC)     Type I  . Claustrophobia     when things get around his face.   [name] Kitchen ESRD (end stage renal disease) ([place])     a. 1995 s/p cadaveric transplant w/ susbequent failure after 18 yrs;  b.Dialysis initiated 07/2011 [facility]  . Constipation   . Arthritis     "hands, right knee" (03/19/2014)  . History of blood transfusion     Past Surgical History  Procedure Laterality Date  . Kidney transplant  04/25/1993  . Cataract extraction w/ intraocular lens  implant, bilateral Bilateral 1990's  . Arteriovenous graft placement Left 1993?    forearm  . Arteriovenous graft placement Right 1993?    leg  . Toe amputation Bilateral after 2005  . Below knee leg amputation Left 2010  . Carpal tunnel release Bilateral after 2005  . Shoulder arthroscopy Left ~ 2006    frozen  . Av fistula placement  07/29/2011    Procedure: ARTERIOVENOUS (AV) FISTULA CREATION;  Surgeon: Mal Misty, MD;  Location: [place];  Service: Vascular;  Laterality: Right;  Brachial cephalic  . Insertion of dialysis catheter  08/01/2011    Procedure: INSERTION OF DIALYSIS CATHETER;  Surgeon: Rosetta Posner, MD;  Location: [place];  Service: Vascular;  Laterality: Right;  insertion of dialysis catheter on right internal jugular vein  . Lymph node biopsy  08/10/2011    Procedure: LYMPH NODE BIOPSY;  Surgeon: Merrie Roof, MD;  Location: [place];  Service: General;  Laterality: Left;  left groin lymph biopsy  . Colonoscopy  08/29/2011    Procedure: COLONOSCOPY;  Surgeon: Jerene Bears, MD;  Location: [place];  Service: Gastroenterology;  Laterality: N/A;  . Appendectomy  ~ 2006  . Below knee leg amputation Left   . Umbilical hernia repair  1990's  . Cardiac catheterization  03/19/2014  . Av graft placement Left 1993?    "attempted one in my wrist; didn't take"  . Neuroplasty / transposition ulnar nerve at elbow Left after 2005  . Venogram Right  07/28/2011    Procedure: VENOGRAM;  Surgeon: Conrad Meeker, MD;  Location[facility] CATH LAB;  Service: Cardiovascular;  Laterality: Right;  . Left heart catheterization with coronary angiogram N/A 03/19/2014    Procedure: LEFT HEART CATHETERIZATION WITH CORONARY ANGIOGRAM;  Surgeon: Burnell Blanks, MD;  Location: [facility] CATH LAB;  Service: Cardiovascular;  Laterality: N/A;  . Cardiac catheterization  03/19/2014    Procedure: CORONARY BALLOON ANGIOPLASTY;  Surgeon: Burnell Blanks, MD;  Location: [facility] CATH LAB;  Service: Cardiovascular;;  . Coronary angioplasty with stent placement  04/09/2014        ptca/des mid lad   . Angioplasty  04/09/2014    RCA   . Femoral artery repair  04/09/2014    ANGIOSEAL  . Percutaneous coronary rotoblator intervention (pci-r) N/A 04/09/2014    Procedure: PERCUTANEOUS CORONARY ROTOBLATOR INTERVENTION (PCI-R);  Surgeon: Burnell Blanks, MD;  Location[name] CATH LAB;  Service: Cardiovascular;  Laterality: N/A;  . Revison of arteriovenous fistula Right [id number]    Procedure: PLICATION / REVISION OF ARTERIOVENOUS FISTULA;  Surgeon: Angelia Mould, MD;  Location: [place];  Service: Vascular;  Laterality: Right;  . Peripheral vascular catheterization N/A 12/25/2014    Procedure: Upper Extremity Venography;  Surgeon: Conrad [place], MD;  Location: [place] CV LAB;  Service: Cardiovascular;  Laterality: N/A;  . Carpal tunnel release Bilateral   . Eye surgery Bilateral     cataract  . Amputation of replicated toes Right   . Vitrectomy Bilateral   . Peritoneal catheter insertion    . Peritoneal catheter removal    . Arteriovenous graft placement Left     Thigh  . Arteriovenous graft removed Left     Thigh  . Av fistula placement Left 01/20/2015    Procedure: LIGATION OF RIGHT ARTERIOVENOUS FISTULA WITH EXCISION OF ANEURYSM;  Surgeon: Angelia Mould, MD;  Location: [place];  Service: Vascular;  Laterality: Left;  . Insertion of dialysis catheter Right  01/20/2015    Procedure: INSERTION OF DIALYSIS CATHETER - RIGHT INTERNAL JUGULAR ;  Surgeon: Angelia Mould, MD;  Location[facility];  Service: Vascular;  Laterality: Right;    Social History   Social History  . Marital Status: Married    Spouse Name: N/A  . Number of Children: N/A  . Years of Education: N/A   Occupational History  . Disabled    Social History Main Topics  . Smoking status: Never Smoker   . Smokeless tobacco: Never Used  . Alcohol Use: Yes     Comment: 11/19/14 "might have a  drink 2-3 times/year"  . Drug Use: No  . Sexual Activity: Not Currently   Other Topics Concern  . Not on file   Social History Narrative   Lives @ home with his wife in [place].     Family History  Problem Relation Age of Onset  . Malignant hyperthermia Neg Hx   . Heart attack Neg Hx   . Stroke Neg Hx   . Colon polyps Father   . Diabetes Father   . Hypertension Father   . Breast cancer Maternal Aunt   . Cancer Maternal Aunt     Breast and Bone  . Hypertension Mother   . Diabetes Mother   . Hyperlipidemia Mother     Current Outpatient Prescriptions on File Prior to Visit  Medication Sig Dispense Refill  . allopurinol (ZYLOPRIM) 100 MG tablet Take 200 mg by mouth daily.     [name] Kitchen aspirin 81 MG tablet Take 81 mg by mouth every evening.     [name] Kitchen atorvastatin (LIPITOR) 10 MG tablet Take 10 mg by mouth every evening.     . calcium acetate (PHOSLO) 667 MG capsule Take 667-1,334 mg by mouth See admin instructions. Take 1,334 mg (2 capsules) with meals and 667 (1 capsule) with snacks    . diphenhydrAMINE (BENADRYL) 25 mg capsule Take 1-2 capsules (25-50 mg total) by mouth every 6 (six) hours as needed for itching. 30 capsule 0  . docusate sodium (COLACE) 100 MG capsule Take 200 mg by mouth 3 (three) times daily.     [name] Kitchen EFFIENT 10 MG TABS tablet TAKE 1 TABLET BY MOUTH EVERY DAY 30 tablet 0  . furosemide (LASIX) 80 MG tablet Take 80 mg by mouth 2 (two) times daily.   98  . GLUCAGON  EMERGENCY 1 MG injection Inject 1 mg into the muscle daily as needed (low blood sugar).     . hydrocortisone 2.5 % cream APPLY 2-3 TIMES DAILY AS NEEDED TO AFFECTED AREA(S).  1  . HYDROmorphone (DILAUDID) 2 MG tablet Take 1 tablet (2 mg total) by mouth 3 (three) times daily as needed for severe pain. 20 tablet 0  . Insulin Human (INSULIN PUMP) 100 unit/ml SOLN Inject into the skin continuous. Humalog    . levothyroxine (SYNTHROID, LEVOTHROID) 100 MCG tablet Take 100 mcg by mouth daily before breakfast.    . loratadine (CLARITIN) 10 MG tablet Take 10 mg by mouth daily.    . metoCLOPramide (REGLAN) 5 MG tablet Take 5 mg by mouth 4 (four) times daily - after meals and at bedtime.    . metoprolol succinate (TOPROL-XL) 25 MG 24 hr tablet Take 0.5 tablets (12.5 mg total) by mouth as directed. Take 12.5 mg on Tu, Th, Sat and Sun; HOLD on dialysis days M, W, F (Patient taking differently: Take 12.5 mg by mouth daily. )    . multivitamin (RENA-VIT) TABS tablet Take 1 tablet by mouth daily.    . nitroGLYCERIN (NITROSTAT) 0.4 MG SL tablet Place 0.4 mg under the tongue every 5 (five) minutes as needed for chest pain.     . promethazine (PHENERGAN) 25 MG tablet Take 1 tablet (25 mg total) by mouth every 6 (six) hours as needed for nausea or vomiting. For nausea 30 tablet 0  . ranitidine (ZANTAC) 150 MG tablet Take 150 mg by mouth 2 (two) times daily.  4  . rOPINIRole (REQUIP) 2 MG tablet Take 2 mg by mouth daily with supper.     . sevelamer carbonate (RENVELA) 800  MG tablet Take 1,600 mg by mouth 3 (three) times daily with meals.     Current Facility-Administered Medications on File Prior to Visit  Medication Dose Route Frequency Provider Last Rate Last Dose  . 0.9 %  sodium chloride infusion   Intravenous Continuous Angelia Mould, MD      . Chlorhexidine Gluconate Cloth 2 % PADS 6 each  6 each Topical Once Angelia Mould, MD        Allergies  Allergen Reactions  . Morphine And Related Hives  and Nausea Only  . Amoxicillin Other (See Comments)    Reaction unknown  . Ciprofloxacin Other (See Comments)    Reaction unknown  . Clindamycin/Lincomycin Other (See Comments)    Reaction unknown  . Codeine Other (See Comments)    Reaction unknown. But pt has tolerated hydrocodone in Vicodin and Norco in the past  . Contrast Media [Iodinated Diagnostic Agents] Rash  . Doxycycline Rash  . Hydrocodone Rash  . Levofloxacin Other (See Comments)    Reaction unknown  . Oxycodone Rash  . Plavix [Clopidogrel Bisulfate] Rash  . Rocephin [Ceftriaxone] Itching  . Vasotec Cough    Review of Systems: As listed above, otherwise negative.  Physical Examination  Filed Vitals:   02/18/15 1329  BP: 133/63  Pulse: 86  Height: 5\' 8"  (1.727 m)  Weight: 274 lb (124.286 kg)  SpO2: 96%    General: A&O x 3, WDWN  Pulmonary: Sym exp, good air movt, non labored breathing Cardiac: RRR, Nl S1, S2, no Murmurs, rubs or gallops, S3/S4, Irregularly, irregular rhythm and rate  Gastrointestinal: soft, NTND  Musculoskeletal: M/S 5/5 grossly bilateral UE.  Decreased edema in the right UE since surgery with well healed incisions.    Palpable radial pulse left UR min. Edema in bil. UE.    Vein mapping 02/13/2015  Shows acceptable left basilic vein [id number] cm diameter  Laboratory See iStat  Medical Decision Making  Fuller Holzworth is a 49 y.o. male who presents S/P right IJ and ligationof right brachiocephalic fistula and excision of aneurysm. Dr. Scot Dock is recommending left basilic vein transposition verses graft placement Jan 08/2015 with assistance from Dr. [name] to come off his Effient.   Theda Sers, Soledad Budreau [name] PA-C [facility] [place] The patient was seen in clinic today buy Dr. Scot Dock   02/18/2015, 1:57 PM  Agree with above. His swelling in the right arm is better now that his fistula has been ligated. He's had a previous left forearm AV graft. Based on a  previous vein map in October 2016 he appeared to have an adequate upper arm cephalic vein for fistula or possibly a basilic vein transposition. He did undergo a venogram which showed no evidence of central venous stenosis on the left. Once he can come off his Effient in late December we can schedule him for new access. First choice would be a brachial cephalic fistula if possible. Second choice would be a basilic vein transposition. Third choice would be an upper arm graft.   [name], MD, [place] [phone] Office: [phone]

## 2015-02-19 ENCOUNTER — Ambulatory Visit: Payer: Medicare Other | Admitting: Cardiovascular Disease

## 2015-02-24 ENCOUNTER — Inpatient Hospital Stay (HOSPITAL_COMMUNITY)
Admission: EM | Admit: 2015-02-24 | Discharge: 2015-02-27 | DRG: 304 | Disposition: A | Discharge status: Home / Self Care | Payer: Medicare Other | Attending: Internal Medicine | Admitting: Internal Medicine

## 2015-02-24 ENCOUNTER — Emergency Department (HOSPITAL_COMMUNITY): Payer: Medicare Other

## 2015-02-24 ENCOUNTER — Encounter (HOSPITAL_COMMUNITY): Payer: Self-pay | Admitting: *Deleted

## 2015-02-24 ENCOUNTER — Other Ambulatory Visit: Payer: Self-pay

## 2015-02-24 DIAGNOSIS — Z7982 Long term (current) use of aspirin: Secondary | ICD-10-CM

## 2015-02-24 DIAGNOSIS — D638 Anemia in other chronic diseases classified elsewhere: Secondary | ICD-10-CM | POA: Diagnosis present

## 2015-02-24 DIAGNOSIS — I10 Essential (primary) hypertension: Secondary | ICD-10-CM | POA: Diagnosis not present

## 2015-02-24 DIAGNOSIS — Z955 Presence of coronary angioplasty implant and graft: Secondary | ICD-10-CM

## 2015-02-24 DIAGNOSIS — E781 Pure hyperglyceridemia: Secondary | ICD-10-CM | POA: Diagnosis present

## 2015-02-24 DIAGNOSIS — I5032 Chronic diastolic (congestive) heart failure: Secondary | ICD-10-CM | POA: Diagnosis present

## 2015-02-24 DIAGNOSIS — I161 Hypertensive emergency: Principal | ICD-10-CM | POA: Diagnosis present

## 2015-02-24 DIAGNOSIS — D631 Anemia in chronic kidney disease: Secondary | ICD-10-CM | POA: Diagnosis present

## 2015-02-24 DIAGNOSIS — Z6841 Body Mass Index (BMI) 40.0 and over, adult: Secondary | ICD-10-CM

## 2015-02-24 DIAGNOSIS — E1122 Type 2 diabetes mellitus with diabetic chronic kidney disease: Secondary | ICD-10-CM | POA: Diagnosis present

## 2015-02-24 DIAGNOSIS — Z940 Kidney transplant status: Secondary | ICD-10-CM

## 2015-02-24 DIAGNOSIS — E1129 Type 2 diabetes mellitus with other diabetic kidney complication: Secondary | ICD-10-CM

## 2015-02-24 DIAGNOSIS — N186 End stage renal disease: Secondary | ICD-10-CM | POA: Diagnosis not present

## 2015-02-24 DIAGNOSIS — Z9641 Presence of insulin pump (external) (internal): Secondary | ICD-10-CM | POA: Diagnosis present

## 2015-02-24 DIAGNOSIS — Z794 Long term (current) use of insulin: Secondary | ICD-10-CM

## 2015-02-24 DIAGNOSIS — Z79899 Other long term (current) drug therapy: Secondary | ICD-10-CM

## 2015-02-24 DIAGNOSIS — Z9842 Cataract extraction status, left eye: Secondary | ICD-10-CM

## 2015-02-24 DIAGNOSIS — E039 Hypothyroidism, unspecified: Secondary | ICD-10-CM | POA: Diagnosis present

## 2015-02-24 DIAGNOSIS — R079 Chest pain, unspecified: Secondary | ICD-10-CM | POA: Diagnosis not present

## 2015-02-24 DIAGNOSIS — Z961 Presence of intraocular lens: Secondary | ICD-10-CM | POA: Diagnosis present

## 2015-02-24 DIAGNOSIS — Z89512 Acquired absence of left leg below knee: Secondary | ICD-10-CM

## 2015-02-24 DIAGNOSIS — R072 Precordial pain: Secondary | ICD-10-CM

## 2015-02-24 DIAGNOSIS — Z9841 Cataract extraction status, right eye: Secondary | ICD-10-CM

## 2015-02-24 DIAGNOSIS — K219 Gastro-esophageal reflux disease without esophagitis: Secondary | ICD-10-CM | POA: Diagnosis present

## 2015-02-24 DIAGNOSIS — E785 Hyperlipidemia, unspecified: Secondary | ICD-10-CM | POA: Diagnosis present

## 2015-02-24 DIAGNOSIS — M1990 Unspecified osteoarthritis, unspecified site: Secondary | ICD-10-CM | POA: Diagnosis present

## 2015-02-24 DIAGNOSIS — I2510 Atherosclerotic heart disease of native coronary artery without angina pectoris: Secondary | ICD-10-CM | POA: Diagnosis present

## 2015-02-24 DIAGNOSIS — M797 Fibromyalgia: Secondary | ICD-10-CM | POA: Diagnosis present

## 2015-02-24 DIAGNOSIS — M109 Gout, unspecified: Secondary | ICD-10-CM | POA: Diagnosis present

## 2015-02-24 DIAGNOSIS — I132 Hypertensive heart and chronic kidney disease with heart failure and with stage 5 chronic kidney disease, or end stage renal disease: Secondary | ICD-10-CM | POA: Diagnosis present

## 2015-02-24 DIAGNOSIS — Z992 Dependence on renal dialysis: Secondary | ICD-10-CM

## 2015-02-24 DIAGNOSIS — G4733 Obstructive sleep apnea (adult) (pediatric): Secondary | ICD-10-CM | POA: Diagnosis present

## 2015-02-24 DIAGNOSIS — Z91041 Radiographic dye allergy status: Secondary | ICD-10-CM

## 2015-02-24 LAB — BASIC METABOLIC PANEL
Anion gap: 13 (ref 5–15)
BUN: 39 mg/dL — ABNORMAL HIGH (ref 6–20)
CO2: 27 mmol/L (ref 22–32)
Calcium: 9.8 mg/dL (ref 8.9–10.3)
Chloride: 94 mmol/L — ABNORMAL LOW (ref 101–111)
Creatinine, Ser: 8.44 mg/dL — ABNORMAL HIGH (ref 0.61–1.24)
GFR calc Af Amer: 8 mL/min — ABNORMAL LOW (ref >60)
GFR calc non Af Amer: 7 mL/min — ABNORMAL LOW (ref >60)
Glucose, Bld: 271 mg/dL — ABNORMAL HIGH (ref 65–99)
Potassium: 5.1 mmol/L (ref 3.5–5.1)
Sodium: 134 mmol/L — ABNORMAL LOW (ref 135–145)

## 2015-02-24 LAB — CBC
HCT: 40.9 % (ref 39.0–52.0)
Hemoglobin: 13.1 g/dL (ref 13.0–17.0)
MCH: 32 pg (ref 26.0–34.0)
MCHC: 32 g/dL (ref 30.0–36.0)
MCV: 100 fL (ref 78.0–100.0)
Platelets: 177 10*3/uL (ref 150–400)
RBC: 4.09 MIL/uL — ABNORMAL LOW (ref 4.22–5.81)
RDW: 15.5 % (ref 11.5–15.5)
WBC: 5.1 10*3/uL (ref 4.0–10.5)

## 2015-02-24 MED ORDER — NITROGLYCERIN 2 % TD OINT
1.0000 [in_us] | TOPICAL_OINTMENT | Freq: Once | TRANSDERMAL | Status: AC
  Administered 2015-02-25: 1 [in_us] via TOPICAL
  Filled 2015-02-24: qty 1

## 2015-02-24 MED ORDER — HYDROMORPHONE HCL 1 MG/ML IJ SOLN
0.5000 mg | Freq: Once | INTRAMUSCULAR | Status: AC
  Administered 2015-02-24: 0.5 mg via INTRAVENOUS
  Filled 2015-02-24: qty 1

## 2015-02-24 MED ORDER — ASPIRIN 81 MG PO CHEW
324.0000 mg | CHEWABLE_TABLET | Freq: Once | ORAL | Status: AC
  Administered 2015-02-24: 324 mg via ORAL
  Filled 2015-02-24: qty 4

## 2015-02-24 MED ORDER — LABETALOL HCL 5 MG/ML IV SOLN
10.0000 mg | Freq: Once | INTRAVENOUS | Status: AC
  Administered 2015-02-24: 10 mg via INTRAVENOUS
  Filled 2015-02-24: qty 4

## 2015-02-24 NOTE — H&P (Addendum)
[facility] History and Physical  Dale [name] [id number] DOB: [birth year] DOA: 02/24/2015  Referring physician: Julianne Rice, MD PCP: Windy Kalata, MD   Chief Complaint: Chest Pain/Heaviness  HPI: Dale [name] is a 48 y.o. male with prior history of HTN ESRD on HD CHF HLD GERD OSA presents with chest pain and pressure. He states that this evening he noticed that his blood pressure was elevated. He called his nephrologist and he took an extra dose of lopressor 25mg . He states that he normally takes 12.5 daily. Patient states that he also developed the pressure and feels as though there is a heaviness on his chest. He state he is normally dialysed on MWF and he did go for his regular session as scheduled. He has noted some shortness of breath also. He has no syncope. Presure does not seem to radiate anywhere. He states that he has no cough or congestion. He states that he has not had any excessive weight gain noted. In the ED his pressures were in the 190-200 range. He was given additional dose of labetalol and dilaudid along with ASA   Review of Systems:  Complete 12 point ROS performed and is unremarkable other than HPI   Past Medical History  Diagnosis Date  . Hypertension   . Anemia of chronic disease   . Cholecystitis     a. 08/27/2011  . Constipation   . Pericardial effusion     a.  Small by CT 08/22/11;  b.  Large by CT 08/27/11  . Inguinal lymphadenopathy     a. bilateral - s/p biopsy 07/2011  . Fibromyalgia   . Chronic diastolic CHF (congestive heart failure) ([place])     a. Echo 3/13:  EF 55-60%;  b. Echo (11/15):  Mild LVH, EF 60-65%, no RWMA, Gr 1 DD, MAC, mild LAE, normal RVF, mild RAE;  c.  Echo 5/16:  severe LVH, EF 55-60%, no RWMA, Gr 1 DD, MAC, mild LAE  . HLD (hyperlipidemia)   . Hypothyroidism   . GERD (gastroesophageal reflux disease)   . CAD (coronary artery disease)     a. Lexiscan Myoview (11/15):  Inf, apical cap and apical lateral ischemia, EF  56%, inf HK, Moderate Risk;  b. LHC (11/15):  mid to dist Dx 80%, mid RCA 99% (functional CTO), dist RCA 80% >> PCI: balloon angioplasty to mid RCA (could not deliver stent) - plan staged PCI with HSRA of RCA; Dx to be tx medically >> PCI: Rotoblator atherectomy/DES to [facility]  . OSA (obstructive sleep apnea)     adjustable bed  . Shortness of breath dyspnea     too much Fluid  . Pneumonia ~ 2007  . Insulin dependent diabetes mellitus (HCC)     Type I  . Claustrophobia     when things get around his face.   [name] Kitchen ESRD (end stage renal disease) ([place])     a. 1995 s/p cadaveric transplant w/ susbequent failure after 18 yrs;  b.Dialysis initiated 07/2011 [facility]  . Constipation   . Arthritis     "hands, right knee" (03/19/2014)  . History of blood transfusion    Past Surgical History  Procedure Laterality Date  . Kidney transplant  04/25/1993  . Cataract extraction w/ intraocular lens  implant, bilateral Bilateral 1990's  . Arteriovenous graft placement Left 1993?    forearm  . Arteriovenous graft placement Right 1993?    leg  . Toe amputation Bilateral after 2005  . Below knee  leg amputation Left 2010  . Carpal tunnel release Bilateral after 2005  . Shoulder arthroscopy Left ~ 2006    frozen  . Av fistula placement  07/29/2011    Procedure: ARTERIOVENOUS (AV) FISTULA CREATION;  Surgeon: Mal Misty, MD;  Location: [place];  Service: Vascular;  Laterality: Right;  Brachial cephalic  . Insertion of dialysis catheter  08/01/2011    Procedure: INSERTION OF DIALYSIS CATHETER;  Surgeon: Rosetta Posner, MD;  Location: [place];  Service: Vascular;  Laterality: Right;  insertion of dialysis catheter on right internal jugular vein  . Lymph node biopsy  08/10/2011    Procedure: LYMPH NODE BIOPSY;  Surgeon: Merrie Roof, MD;  Location: [name];  Service: General;  Laterality: Left;  left groin lymph biopsy  . Colonoscopy  08/29/2011    Procedure: COLONOSCOPY;  Surgeon: Jerene Bears, MD;  Location: [place];  Service: Gastroenterology;  Laterality: N/A;  . Appendectomy  ~ 2006  . Below knee leg amputation Left   . Umbilical hernia repair  1990's  . Cardiac catheterization  03/19/2014  . Av graft placement Left 1993?    "attempted one in my wrist; didn't take"  . Neuroplasty / transposition ulnar nerve at elbow Left after 2005  . Venogram Right 07/28/2011    Procedure: VENOGRAM;  Surgeon: Conrad Bayport, MD;  Location: [facility] CATH LAB;  Service: Cardiovascular;  Laterality: Right;  . Left heart catheterization with coronary angiogram N/A 03/19/2014    Procedure: LEFT HEART CATHETERIZATION WITH CORONARY ANGIOGRAM;  Surgeon: Burnell Blanks, MD;  Location: [facility] CATH LAB;  Service: Cardiovascular;  Laterality: N/A;  . Cardiac catheterization  03/19/2014    Procedure: CORONARY BALLOON ANGIOPLASTY;  Surgeon: Burnell Blanks, MD;  Location: [facility] CATH LAB;  Service: Cardiovascular;;  . Coronary angioplasty with stent placement  04/09/2014        ptca/des mid lad   . Angioplasty  04/09/2014    RCA   . Femoral artery repair  04/09/2014    ANGIOSEAL  . Percutaneous coronary rotoblator intervention (pci-r) N/A 04/09/2014    Procedure: PERCUTANEOUS CORONARY ROTOBLATOR INTERVENTION (PCI-R);  Surgeon: Burnell Blanks, MD;  Location: [facility] CATH LAB;  Service: Cardiovascular;  Laterality: N/A;  . Revison of arteriovenous fistula Right [id number]    Procedure: PLICATION / REVISION OF ARTERIOVENOUS FISTULA;  Surgeon: Angelia Mould, MD;  Location: [place];  Service: Vascular;  Laterality: Right;  . Peripheral vascular catheterization N/A 12/25/2014    Procedure: Upper Extremity Venography;  Surgeon: Conrad [place], MD;  Location: [place] CV LAB;  Service: Cardiovascular;  Laterality: N/A;  . Carpal tunnel release Bilateral   . Eye surgery Bilateral     cataract  . Amputation of replicated toes Right   . Vitrectomy Bilateral   . Peritoneal catheter insertion    . Peritoneal catheter  removal    . Arteriovenous graft placement Left     Thigh  . Arteriovenous graft removed Left     Thigh  . Av fistula placement Left 01/20/2015    Procedure: LIGATION OF RIGHT ARTERIOVENOUS FISTULA WITH EXCISION OF ANEURYSM;  Surgeon: Angelia Mould, MD;  Location: [place];  Service: Vascular;  Laterality: Left;  . Insertion of dialysis catheter Right 01/20/2015    Procedure: INSERTION OF DIALYSIS CATHETER - RIGHT INTERNAL JUGULAR ;  Surgeon: Angelia Mould, MD;  Location: [place];  Service: Vascular;  Laterality: Right;   Social History:  reports that he has  never smoked. He has never used smokeless tobacco. He reports that he drinks alcohol. He reports that he does not use illicit drugs.  Allergies  Allergen Reactions  . Morphine And Related Hives and Nausea Only  . Amoxicillin Other (See Comments)    Reaction unknown  . Ciprofloxacin Other (See Comments)    Reaction unknown  . Clindamycin/Lincomycin Other (See Comments)    Reaction unknown  . Codeine Other (See Comments)    Reaction unknown. But pt has tolerated hydrocodone in Vicodin and Norco in the past  . Contrast Media [Iodinated Diagnostic Agents] Rash  . Doxycycline Rash  . Hydrocodone Rash  . Levofloxacin Other (See Comments)    Reaction unknown  . Oxycodone Rash  . Plavix [Clopidogrel Bisulfate] Rash  . Rocephin [Ceftriaxone] Itching  . Vasotec Cough    Family History  Problem Relation Age of Onset  . Malignant hyperthermia Neg Hx   . Heart attack Neg Hx   . Stroke Neg Hx   . Colon polyps Father   . Diabetes Father   . Hypertension Father   . Breast cancer Maternal Aunt   . Cancer Maternal Aunt     Breast and Bone  . Hypertension Mother   . Diabetes Mother   . Hyperlipidemia Mother      Prior to Admission medications   Medication Sig Start Date End Date Taking? Authorizing Provider  allopurinol (ZYLOPRIM) 100 MG tablet Take 200 mg by mouth daily.  03/14/12   Historical Provider, MD  aspirin 81  MG tablet Take 81 mg by mouth every evening.     Historical Provider, MD  atorvastatin (LIPITOR) 10 MG tablet Take 10 mg by mouth every evening.     Historical Provider, MD  calcium acetate (PHOSLO) 667 MG capsule Take 667-1,334 mg by mouth See admin instructions. Take 1,334 mg (2 capsules) with meals and 667 (1 capsule) with snacks 09/19/12   Historical Provider, MD  diphenhydrAMINE (BENADRYL) 25 mg capsule Take 1-2 capsules (25-50 mg total) by mouth every 6 (six) hours as needed for itching. 04/11/14   Rhonda G Barrett, PA-C  docusate sodium (COLACE) 100 MG capsule Take 200 mg by mouth 3 (three) times daily.     Historical Provider, MD  EFFIENT 10 MG TABS tablet TAKE 1 TABLET BY MOUTH EVERY DAY 10/08/14   Burnell Blanks, MD  furosemide (LASIX) 80 MG tablet Take 80 mg by mouth 2 (two) times daily.  11/25/14   Historical Provider, MD  GLUCAGON EMERGENCY 1 MG injection Inject 1 mg into the muscle daily as needed (low blood sugar).  01/19/12   Historical Provider, MD  hydrocortisone 2.5 % cream APPLY 2-3 TIMES DAILY AS NEEDED TO AFFECTED AREA(S). 10/30/14   Historical Provider, MD  HYDROmorphone (DILAUDID) 2 MG tablet Take 1 tablet (2 mg total) by mouth 3 (three) times daily as needed for severe pain. 11/20/14   Alvia Grove, PA-C  Insulin Human (INSULIN PUMP) 100 unit/ml SOLN Inject into the skin continuous. Humalog    Historical Provider, MD  levothyroxine (SYNTHROID, LEVOTHROID) 100 MCG tablet Take 100 mcg by mouth daily before breakfast.    Historical Provider, MD  loratadine (CLARITIN) 10 MG tablet Take 10 mg by mouth daily.    Historical Provider, MD  metoCLOPramide (REGLAN) 5 MG tablet Take 5 mg by mouth 4 (four) times daily - after meals and at bedtime. 05/26/11   Historical Provider, MD  metoprolol succinate (TOPROL-XL) 25 MG 24 hr tablet Take 0.5 tablets (12.5  mg total) by mouth as directed. Take 12.5 mg on Tu, Th, Sat and Sun; HOLD on dialysis days M, W, F Patient taking differently: Take  12.5 mg by mouth daily.  11/27/14   Liliane Shi, PA-C  multivitamin (RENA-VIT) TABS tablet Take 1 tablet by mouth daily.    Historical Provider, MD  nitroGLYCERIN (NITROSTAT) 0.4 MG SL tablet Place 0.4 mg under the tongue every 5 (five) minutes as needed for chest pain.     [name], MD  promethazine (PHENERGAN) 25 MG tablet Take 1 tablet (25 mg total) by mouth every 6 (six) hours as needed for nausea or vomiting. For nausea 11/27/14   Liliane Shi, PA-C  ranitidine (ZANTAC) 150 MG tablet Take 150 mg by mouth 2 (two) times daily. 11/01/14   Historical Provider, MD  rOPINIRole (REQUIP) 2 MG tablet Take 2 mg by mouth daily with supper.     Historical Provider, MD  sevelamer carbonate (RENVELA) 800 MG tablet Take 1,600 mg by mouth 3 (three) times daily with meals.    Historical Provider, MD   Physical Exam: Filed Vitals:   02/24/15 2206 02/24/15 2312 02/24/15 2315  BP: 207/98 198/100 197/126  Pulse: 92 90 91  Temp: 97.8 F (36.6 C)    TempSrc: Oral    Resp: 20 12   Height: 5\' 8"  (1.727 m)    Weight: 124.286 kg (274 lb)    SpO2: 93% 99% 99%    Wt Readings from Last 3 Encounters:  02/24/15 124.286 kg (274 lb)  02/18/15 124.286 kg (274 lb)  01/22/15 124.6 kg (274 lb 11.1 oz)    General:  Appears calm and comfortable Eyes: PERRL, normal lids, irises & conjunctiva ENT: grossly normal hearing, lips & tongue Neck: no LAD, masses or thyromegaly Cardiovascular: RRR, no m/r/g. No LE edema. Respiratory: CTA bilaterally, no w/r/r. Normal respiratory effort. Abdomen: soft, ntnd Skin: no rash or induration seen on limited exam Musculoskeletal: left BKA right LE wrapped up due to recent injury being followed [facility] foot [facility] ankle Psychiatric: grossly normal mood and affect Neurologic: grossly non-focal.          Labs on Admission:  Basic Metabolic Panel:  Recent Labs Lab 02/24/15 2229  NA 134*  K 5.1  CL 94*  CO2 27  GLUCOSE 271*  BUN 39*  CREATININE 8.44*  CALCIUM  9.8   Liver Function Tests: No results for input(s): AST, ALT, ALKPHOS, BILITOT, PROT, ALBUMIN in the last 168 hours. No results for input(s): LIPASE, AMYLASE in the last 168 hours. No results for input(s): AMMONIA in the last 168 hours. CBC:  Recent Labs Lab 02/24/15 2229  WBC 5.1  HGB 13.1  HCT 40.9  MCV 100.0  PLT 177   Cardiac Enzymes: No results for input(s): CKTOTAL, CKMB, CKMBINDEX, TROPONINI in the last 168 hours.  BNP (last 3 results) No results for input(s): BNP in the last 8760 hours.  ProBNP (last 3 results) No results for input(s): PROBNP in the last 8760 hours.  CBG: No results for input(s): GLUCAP in the last 168 hours.  Radiological Exams on Admission: Dg Chest 2 View  02/24/2015  CLINICAL DATA:  Chest pain with dizziness for 2 hours. Diabetes. Dialysis patient. EXAM: CHEST  2 VIEW COMPARISON:  01/21/2015 FINDINGS: Right central venous catheter with tip over the cavoatrial junction. No change in position since previous study. No pneumothorax. Mild cardiac enlargement and pulmonary vascular congestion demonstrating mild progression since previous study. No focal airspace disease or  consolidation. No edema. Blunting of the costophrenic angle on the right suggesting small pleural effusion. Mediastinal contours appear intact. Mild degenerative changes in the spine. IMPRESSION: Cardiac enlargement with mild vascular congestion and small right pleural effusion. Electronically Signed   By: Lucienne Capers M.D.   On: 02/24/2015 22:33      Assessment/Plan Principal Problem:   Chest pain Active Problems:   ESRD (end stage renal disease) on dialysis ([place])   HLD (hyperlipidemia)   Accelerated hypertension   DM (diabetes mellitus) type II controlled with renal manifestation ([place])   1. Chest Pain -will admit to step down unit -start on IV NTG -will check serial enzymes -ECG in am -Echo in am -cardiology consult-case discussed with cards  2. Accelerated  HTN -will resume all medications -monitor pressures -consider titration of his medications appears to only be on lopressor at this time as an outpatient  3. ESRD on HD -will need dialysis in am  4. HLD -check lipid panel -continue with statins  5. DM Type II with renal manifestations -will get FSBS -SSI as needed -normally on insulin pump will have pharmacy review dosing    Code Status: full code (must indicate code status--if unknown or must be presumed, indicate so) DVT Prophylaxis:heparin Family Communication: none (indicate person spoken with, if applicable, with phone number if by telephone) Disposition Plan: home (indicate anticipated LOS)    [facility] Pager [phone]

## 2015-02-24 NOTE — ED Notes (Signed)
Pt states he was feeling funny and took his BP and it was 218/110. States he took a total of 25 mg metoprolol (normal dose is 12.5). States he rechecked it 20 minutes later and it was 210/110. Also c/o chest pressure, indigestion, headache.

## 2015-02-24 NOTE — ED Provider Notes (Signed)
CSN: [id number]     Arrival date & time 02/24/15  2200 History   First MD Initiated Contact with Patient 02/24/15 2310     Chief Complaint  Patient presents with  . Chest Pain     (Consider location/radiation/quality/duration/timing/severity/associated sxs/prior Treatment) HPI Patient presents with elevated blood pressure and chest pressure that started this evening around 9 PM. He also has had some shortness of breath but denies any cough or fever. Has had gradual onset headache. No focal weakness or numbness. Her blood pressure at home and elevated. States he took an additional 12.5 mg of metoprolol without significant effect. Patient undergoes hemodialysis on Monday, Wednesday, Friday. Was dialyzed yesterday with normal amount of fluid removed. Past Medical History  Diagnosis Date  . Hypertension   . Anemia of chronic disease   . Cholecystitis     a. 08/27/2011  . Constipation   . Pericardial effusion     a.  Small by CT 08/22/11;  b.  Large by CT 08/27/11  . Inguinal lymphadenopathy     a. bilateral - s/p biopsy 07/2011  . Fibromyalgia   . Chronic diastolic CHF (congestive heart failure) ([place])     a. Echo 3/13:  EF 55-60%;  b. Echo (11/15):  Mild LVH, EF 60-65%, no RWMA, Gr 1 DD, MAC, mild LAE, normal RVF, mild RAE;  c.  Echo 5/16:  severe LVH, EF 55-60%, no RWMA, Gr 1 DD, MAC, mild LAE  . HLD (hyperlipidemia)   . Hypothyroidism   . GERD (gastroesophageal reflux disease)   . CAD (coronary artery disease)     a. Lexiscan Myoview (11/15):  Inf, apical cap and apical lateral ischemia, EF 56%, inf HK, Moderate Risk;  b. LHC (11/15):  mid to dist Dx 80%, mid RCA 99% (functional CTO), dist RCA 80% >> PCI: balloon angioplasty to mid RCA (could not deliver stent) - plan staged PCI with HSRA of RCA; Dx to be tx medically >> PCI: Rotoblator atherectomy/DES to [facility]  . OSA (obstructive sleep apnea)     adjustable bed  . Shortness of breath dyspnea     too much Fluid  . Pneumonia ~ 2007  .  Insulin dependent diabetes mellitus (HCC)     Type I  . Claustrophobia     when things get around his face.   [name] Kitchen ESRD (end stage renal disease) ([place])     a. 1995 s/p cadaveric transplant w/ susbequent failure after 18 yrs;  b.Dialysis initiated 07/2011 [facility] M-W[facility]F  . Constipation   . Arthritis     "hands, right knee" (03/19/2014)  . History of blood transfusion    Past Surgical History  Procedure Laterality Date  . Kidney transplant  04/25/1993  . Cataract extraction w/ intraocular lens  implant, bilateral Bilateral 1990's  . Arteriovenous graft placement Left 1993?    forearm  . Arteriovenous graft placement Right 1993?    leg  . Toe amputation Bilateral after 2005  . Below knee leg amputation Left 2010  . Carpal tunnel release Bilateral after 2005  . Shoulder arthroscopy Left ~ 2006    frozen  . Av fistula placement  07/29/2011    Procedure: ARTERIOVENOUS (AV) FISTULA CREATION;  Surgeon: Mal Misty, MD;  Location: [place];  Service: Vascular;  Laterality: Right;  Brachial cephalic  . Insertion of dialysis catheter  08/01/2011    Procedure: INSERTION OF DIALYSIS CATHETER;  Surgeon: Rosetta Posner, MD;  Location: [place];  Service: Vascular;  Laterality: Right;  insertion of dialysis catheter on right internal jugular vein  . Lymph node biopsy  08/10/2011    Procedure: LYMPH NODE BIOPSY;  Surgeon: Merrie Roof, MD;  Location: [place];  Service: General;  Laterality: Left;  left groin lymph biopsy  . Colonoscopy  08/29/2011    Procedure: COLONOSCOPY;  Surgeon: Jerene Bears, MD;  Location: [place];  Service: Gastroenterology;  Laterality: N/A;  . Appendectomy  ~ 2006  . Below knee leg amputation Left   . Umbilical hernia repair  1990's  . Cardiac catheterization  03/19/2014  . Av graft placement Left 1993?    "attempted one in my wrist; didn't take"  . Neuroplasty / transposition ulnar nerve at elbow Left after 2005  . Venogram Right 07/28/2011    Procedure: VENOGRAM;  Surgeon:  Conrad Jennings Lodge, MD;  Location: [facility] CATH LAB;  Service: Cardiovascular;  Laterality: Right;  . Left heart catheterization with coronary angiogram N/A 03/19/2014    Procedure: LEFT HEART CATHETERIZATION WITH CORONARY ANGIOGRAM;  Surgeon: Burnell Blanks, MD;  Location: [facility] CATH LAB;  Service: Cardiovascular;  Laterality: N/A;  . Cardiac catheterization  03/19/2014    Procedure: CORONARY BALLOON ANGIOPLASTY;  Surgeon: Burnell Blanks, MD;  Location: [facility] CATH LAB;  Service: Cardiovascular;;  . Coronary angioplasty with stent placement  04/09/2014        ptca/des mid lad   . Angioplasty  04/09/2014    RCA   . Femoral artery repair  04/09/2014    ANGIOSEAL  . Percutaneous coronary rotoblator intervention (pci-r) N/A 04/09/2014    Procedure: PERCUTANEOUS CORONARY ROTOBLATOR INTERVENTION (PCI-R);  Surgeon: Burnell Blanks, MD;  Location: [facility] CATH LAB;  Service: Cardiovascular;  Laterality: N/A;  . Revison of arteriovenous fistula Right [id number]    Procedure: PLICATION / REVISION OF ARTERIOVENOUS FISTULA;  Surgeon: Angelia Mould, MD;  Location: [place];  Service: Vascular;  Laterality: Right;  . Peripheral vascular catheterization N/A 12/25/2014    Procedure: Upper Extremity Venography;  Surgeon: Conrad Warfield, MD;  Location: [place] CV LAB;  Service: Cardiovascular;  Laterality: N/A;  . Carpal tunnel release Bilateral   . Eye surgery Bilateral     cataract  . Amputation of replicated toes Right   . Vitrectomy Bilateral   . Peritoneal catheter insertion    . Peritoneal catheter removal    . Arteriovenous graft placement Left     Thigh  . Arteriovenous graft removed Left     Thigh  . Av fistula placement Left 01/20/2015    Procedure: LIGATION OF RIGHT ARTERIOVENOUS FISTULA WITH EXCISION OF ANEURYSM;  Surgeon: Angelia Mould, MD;  Location: [place];  Service: Vascular;  Laterality: Left;  . Insertion of dialysis catheter Right 01/20/2015    Procedure: INSERTION OF  DIALYSIS CATHETER - RIGHT INTERNAL JUGULAR ;  Surgeon: Angelia Mould, MD;  Location: [facility] OR;  Service: Vascular;  Laterality: Right;   Family History  Problem Relation Age of Onset  . Malignant hyperthermia Neg Hx   . Heart attack Neg Hx   . Stroke Neg Hx   . Colon polyps Father   . Diabetes Father   . Hypertension Father   . Breast cancer Maternal Aunt   . Cancer Maternal Aunt     Breast and Bone  . Hypertension Mother   . Diabetes Mother   . Hyperlipidemia Mother    Social History  Substance Use Topics  . Smoking status: Never Smoker   . Smokeless tobacco:  Never Used  . Alcohol Use: Yes     Comment: 11/19/14 "might have a drink 2-3 times/year"    Review of Systems  Constitutional: Negative for fever and chills.  Respiratory: Positive for shortness of breath. Negative for cough.   Cardiovascular: Positive for chest pain. Negative for palpitations and leg swelling.  Gastrointestinal: Negative for nausea, vomiting, abdominal pain and diarrhea.  Musculoskeletal: Negative for myalgias, back pain, neck pain and neck stiffness.  Skin: Negative for rash and wound.  Neurological: Positive for headaches. Negative for dizziness, syncope, weakness, light-headedness and numbness.  All other systems reviewed and are negative.     Allergies  Contrast media; Morphine and related; Amoxicillin; Ciprofloxacin; Clindamycin/lincomycin; Codeine; Doxycycline; Hydrocodone; Levofloxacin; Oxycodone; Plavix; Rocephin; and Vasotec  Home Medications   Prior to Admission medications   Medication Sig Start Date End Date Taking? Authorizing Provider  allopurinol (ZYLOPRIM) 100 MG tablet Take 200 mg by mouth daily.  03/14/12  Yes Historical Provider, MD  aspirin 81 MG tablet Take 81 mg by mouth every evening.    Yes Historical Provider, MD  atorvastatin (LIPITOR) 10 MG tablet Take 40 mg by mouth every evening.    Yes Historical Provider, MD  Beclomethasone Dipropionate (QNASL) 80 MCG/ACT AERS  Place 2 sprays into both nostrils 2 (two) times daily.    Yes Historical Provider, MD  calcium acetate (PHOSLO) 667 MG capsule Take 667-1,334 mg by mouth See admin instructions. Take 1,334 mg (2 capsules) with meals and 667 (1 capsule) with snacks 09/19/12  Yes Historical Provider, MD  diphenhydrAMINE (BENADRYL) 25 mg capsule Take 1-2 capsules (25-50 mg total) by mouth every 6 (six) hours as needed for itching. 04/11/14  Yes Rhonda G Barrett, PA-C  docusate sodium (COLACE) 100 MG capsule Take 200 mg by mouth 3 (three) times daily.    Yes Historical Provider, MD  EFFIENT 10 MG TABS tablet TAKE 1 TABLET BY MOUTH EVERY DAY 10/08/14  Yes Burnell Blanks, MD  furosemide (LASIX) 80 MG tablet Take 80 mg by mouth 2 (two) times daily.  11/25/14  Yes Historical Provider, MD  GLUCAGON EMERGENCY 1 MG injection Inject 1 mg into the muscle daily as needed (low blood sugar).  01/19/12  Yes Historical Provider, MD  hydrocortisone 2.5 % cream APPLY 2-3 TIMES DAILY AS NEEDED TO AFFECTED AREA(S). 10/30/14  Yes Historical Provider, MD  HYDROmorphone (DILAUDID) 2 MG tablet Take 1 tablet (2 mg total) by mouth 3 (three) times daily as needed for severe pain. 11/20/14  Yes Alvia Grove, PA-C  Insulin Human (INSULIN PUMP) 100 unit/ml SOLN Inject 72 each into the skin continuous. Humalog   Yes Historical Provider, MD  levothyroxine (SYNTHROID, LEVOTHROID) 100 MCG tablet Take 100 mcg by mouth at bedtime.    Yes Historical Provider, MD  loratadine (CLARITIN) 10 MG tablet Take 10 mg by mouth daily.   Yes Historical Provider, MD  metoCLOPramide (REGLAN) 5 MG tablet Take 5 mg by mouth 4 (four) times daily - after meals and at bedtime. 05/26/11  Yes Historical Provider, MD  multivitamin (RENA-VIT) TABS tablet Take 1 tablet by mouth daily.   Yes Historical Provider, MD  nitroGLYCERIN (NITROSTAT) 0.4 MG SL tablet Place 0.4 mg under the tongue every 5 (five) minutes as needed for chest pain.    Yes [name], MD  promethazine  (PHENERGAN) 25 MG tablet Take 1 tablet (25 mg total) by mouth every 6 (six) hours as needed for nausea or vomiting. For nausea 11/27/14  Yes Nicki Reaper  T Weaver, PA-C  ranitidine (ZANTAC) 150 MG tablet Take 150 mg by mouth 2 (two) times daily. 11/01/14  Yes Historical Provider, MD  rOPINIRole (REQUIP) 2 MG tablet Take 2 mg by mouth daily with supper.    Yes Historical Provider, MD  sevelamer carbonate (RENVELA) 800 MG tablet Take 1,600 mg by mouth 3 (three) times daily with meals.   Yes Historical Provider, MD  amLODipine (NORVASC) 10 MG tablet Take 1 tablet (10 mg total) by mouth daily. 02/27/15   Shanker Kristeen Mans, MD  fenofibrate (TRICOR) 48 MG tablet Take 1 tablet (48 mg total) by mouth daily. 02/27/15   Shanker Kristeen Mans, MD  metoprolol succinate (TOPROL-XL) 50 MG 24 hr tablet Take 1 tablet (50 mg total) by mouth daily. Take with or immediately following a meal. 02/27/15   Shanker Kristeen Mans, MD   BP 136/63 mmHg  Pulse 86  Temp(Src) 97.5 F (36.4 C) (Oral)  Resp 19  Ht 5\' 8"  (1.727 m)  Wt 269 lb 6.4 oz (122.2 kg)  BMI 40.97 kg/m2  SpO2 100% Physical Exam  Constitutional: He is oriented to person, place, and time. He appears well-developed and well-nourished. No distress.  HENT:  Head: Normocephalic and atraumatic.  Mouth/Throat: Oropharynx is clear and moist. No oropharyngeal exudate.  Eyes: EOM are normal. Pupils are equal, round, and reactive to light.  Neck: Normal range of motion. Neck supple.  Cardiovascular: Normal rate and regular rhythm.  Exam reveals no gallop and no friction rub.   No murmur heard. Pulmonary/Chest: Effort normal and breath sounds normal. No respiratory distress. He has no wheezes. He has no rales. He exhibits no tenderness.  Abdominal: Soft. Bowel sounds are normal. He exhibits no distension and no mass. There is no tenderness. There is no rebound and no guarding.  Musculoskeletal: Normal range of motion. He exhibits no edema or tenderness.  Patient with below the knee  amputation on the left. Right lower leg is wrapped with Ace bandage.  Neurological: He is alert and oriented to person, place, and time.  Moving all extremities. Sensation is intact.  Skin: Skin is warm and dry. No rash noted. No erythema.  Psychiatric: He has a normal mood and affect. His behavior is normal.  Nursing note and vitals reviewed.   ED Course  Procedures (including critical care time) Labs Review Labs Reviewed  BASIC METABOLIC PANEL - Abnormal; Notable for the following:    Sodium 134 (*)    Chloride 94 (*)    Glucose, Bld 271 (*)    BUN 39 (*)    Creatinine, Ser 8.44 (*)    GFR calc non Af Amer 7 (*)    GFR calc Af Amer 8 (*)    All other components within normal limits  CBC - Abnormal; Notable for the following:    RBC 4.09 (*)    All other components within normal limits  BRAIN NATRIURETIC PEPTIDE - Abnormal; Notable for the following:    B Natriuretic Peptide 444.3 (*)    All other components within normal limits  HEMOGLOBIN A1C - Abnormal; Notable for the following:    Hgb A1c MFr Bld 6.6 (*)    All other components within normal limits  GLUCOSE, CAPILLARY - Abnormal; Notable for the following:    Glucose-Capillary 309 (*)    All other components within normal limits  GLUCOSE, CAPILLARY - Abnormal; Notable for the following:    Glucose-Capillary 292 (*)    All other components within normal limits  LIPID PANEL - Abnormal; Notable for the following:    Triglycerides 255 (*)    HDL 29 (*)    VLDL 51 (*)    All other components within normal limits  GLUCOSE, CAPILLARY - Abnormal; Notable for the following:    Glucose-Capillary 290 (*)    All other components within normal limits  GLUCOSE, CAPILLARY - Abnormal; Notable for the following:    Glucose-Capillary 363 (*)    All other components within normal limits  GLUCOSE, CAPILLARY - Abnormal; Notable for the following:    Glucose-Capillary 182 (*)    All other components within normal limits  GLUCOSE,  CAPILLARY - Abnormal; Notable for the following:    Glucose-Capillary 241 (*)    All other components within normal limits  GLUCOSE, CAPILLARY - Abnormal; Notable for the following:    Glucose-Capillary 257 (*)    All other components within normal limits  GLUCOSE, CAPILLARY - Abnormal; Notable for the following:    Glucose-Capillary 212 (*)    All other components within normal limits  GLUCOSE, CAPILLARY - Abnormal; Notable for the following:    Glucose-Capillary 231 (*)    All other components within normal limits  GLUCOSE, CAPILLARY - Abnormal; Notable for the following:    Glucose-Capillary 187 (*)    All other components within normal limits  GLUCOSE, CAPILLARY - Abnormal; Notable for the following:    Glucose-Capillary 125 (*)    All other components within normal limits  GLUCOSE, CAPILLARY - Abnormal; Notable for the following:    Glucose-Capillary 171 (*)    All other components within normal limits  GLUCOSE, CAPILLARY - Abnormal; Notable for the following:    Glucose-Capillary 228 (*)    All other components within normal limits  GLUCOSE, CAPILLARY - Abnormal; Notable for the following:    Glucose-Capillary 170 (*)    All other components within normal limits  BASIC METABOLIC PANEL - Abnormal; Notable for the following:    Chloride 95 (*)    Glucose, Bld 137 (*)    BUN 52 (*)    Creatinine, Ser 8.35 (*)    GFR calc non Af Amer 7 (*)    GFR calc Af Amer 8 (*)    All other components within normal limits  GLUCOSE, CAPILLARY - Abnormal; Notable for the following:    Glucose-Capillary 121 (*)    All other components within normal limits  GLUCOSE, CAPILLARY - Abnormal; Notable for the following:    Glucose-Capillary 110 (*)    All other components within normal limits  GLUCOSE, CAPILLARY - Abnormal; Notable for the following:    Glucose-Capillary 140 (*)    All other components within normal limits  GLUCOSE, CAPILLARY - Abnormal; Notable for the following:     Glucose-Capillary 161 (*)    All other components within normal limits  MRSA PCR SCREENING  TROPONIN I  TROPONIN I  TROPONIN I  VITAMIN C  I-STAT TROPOININ, ED    Imaging Review Nm Myocar Multi W/spect W/wall Motion / Ef  02/26/2015   There was no ST segment deviation noted during stress.  Defect 1: There is a small defect of mild severity present in the mid anterior location.  The study is normal.  This is a low risk study.  The left ventricular ejection fraction is mildly decreased (45-54%).    I have personally reviewed and evaluated these images and lab results as part of my medical decision-making.   EKG Interpretation   Date/Time:  Tuesday February 24 2015 22:02:34 EDT Ventricular Rate:  93 PR Interval:  170 QRS Duration: 84 QT Interval:  380 QTC Calculation: 472 R Axis:   72 Text Interpretation:  Normal sinus rhythm Normal ECG Confirmed by  Lita Mains  MD, Roxan Yamamoto ([id number]) on 02/24/2015 11:11:38 PM      MDM   Final diagnoses:  Chest pain at rest    Patient with normal EKG. Initial troponin is normal. We'll attempt to reduce blood pressure.  Patient has mild elevation in troponin. Discussed with Dr. Aundra Dubin. We'll see patient in consult. Patient's blood pressures improved after beta blocker. States headache is improved but still has mild chest pressure. We'll start NGT. Discussed with Dr. Humphrey Rolls. Will see patient in the emergency department and admit to step down bed.  Julianne Rice, MD 02/28/15 [phone]

## 2015-02-25 ENCOUNTER — Inpatient Hospital Stay (HOSPITAL_COMMUNITY): Payer: Medicare Other

## 2015-02-25 ENCOUNTER — Other Ambulatory Visit: Payer: Self-pay

## 2015-02-25 ENCOUNTER — Encounter (HOSPITAL_COMMUNITY): Payer: Self-pay | Admitting: Physician Assistant

## 2015-02-25 DIAGNOSIS — I200 Unstable angina: Secondary | ICD-10-CM

## 2015-02-25 DIAGNOSIS — E039 Hypothyroidism, unspecified: Secondary | ICD-10-CM | POA: Diagnosis present

## 2015-02-25 DIAGNOSIS — D638 Anemia in other chronic diseases classified elsewhere: Secondary | ICD-10-CM | POA: Diagnosis present

## 2015-02-25 DIAGNOSIS — Z961 Presence of intraocular lens: Secondary | ICD-10-CM | POA: Diagnosis present

## 2015-02-25 DIAGNOSIS — Z9841 Cataract extraction status, right eye: Secondary | ICD-10-CM | POA: Diagnosis not present

## 2015-02-25 DIAGNOSIS — R0789 Other chest pain: Secondary | ICD-10-CM

## 2015-02-25 DIAGNOSIS — R079 Chest pain, unspecified: Secondary | ICD-10-CM

## 2015-02-25 DIAGNOSIS — N186 End stage renal disease: Secondary | ICD-10-CM | POA: Diagnosis present

## 2015-02-25 DIAGNOSIS — I209 Angina pectoris, unspecified: Secondary | ICD-10-CM

## 2015-02-25 DIAGNOSIS — Z7982 Long term (current) use of aspirin: Secondary | ICD-10-CM | POA: Diagnosis not present

## 2015-02-25 DIAGNOSIS — Z940 Kidney transplant status: Secondary | ICD-10-CM | POA: Diagnosis not present

## 2015-02-25 DIAGNOSIS — K219 Gastro-esophageal reflux disease without esophagitis: Secondary | ICD-10-CM | POA: Diagnosis present

## 2015-02-25 DIAGNOSIS — Z992 Dependence on renal dialysis: Secondary | ICD-10-CM

## 2015-02-25 DIAGNOSIS — D631 Anemia in chronic kidney disease: Secondary | ICD-10-CM | POA: Diagnosis present

## 2015-02-25 DIAGNOSIS — I161 Hypertensive emergency: Secondary | ICD-10-CM | POA: Diagnosis present

## 2015-02-25 DIAGNOSIS — I5032 Chronic diastolic (congestive) heart failure: Secondary | ICD-10-CM | POA: Diagnosis present

## 2015-02-25 DIAGNOSIS — Z794 Long term (current) use of insulin: Secondary | ICD-10-CM | POA: Diagnosis not present

## 2015-02-25 DIAGNOSIS — Z6841 Body Mass Index (BMI) 40.0 and over, adult: Secondary | ICD-10-CM | POA: Diagnosis not present

## 2015-02-25 DIAGNOSIS — M797 Fibromyalgia: Secondary | ICD-10-CM | POA: Diagnosis present

## 2015-02-25 DIAGNOSIS — E1122 Type 2 diabetes mellitus with diabetic chronic kidney disease: Secondary | ICD-10-CM | POA: Diagnosis present

## 2015-02-25 DIAGNOSIS — Z91041 Radiographic dye allergy status: Secondary | ICD-10-CM | POA: Diagnosis not present

## 2015-02-25 DIAGNOSIS — I2510 Atherosclerotic heart disease of native coronary artery without angina pectoris: Secondary | ICD-10-CM | POA: Diagnosis present

## 2015-02-25 DIAGNOSIS — M109 Gout, unspecified: Secondary | ICD-10-CM | POA: Diagnosis present

## 2015-02-25 DIAGNOSIS — Z79899 Other long term (current) drug therapy: Secondary | ICD-10-CM | POA: Diagnosis not present

## 2015-02-25 DIAGNOSIS — M1990 Unspecified osteoarthritis, unspecified site: Secondary | ICD-10-CM | POA: Diagnosis present

## 2015-02-25 DIAGNOSIS — Z9842 Cataract extraction status, left eye: Secondary | ICD-10-CM | POA: Diagnosis not present

## 2015-02-25 DIAGNOSIS — I10 Essential (primary) hypertension: Secondary | ICD-10-CM | POA: Diagnosis not present

## 2015-02-25 DIAGNOSIS — G4733 Obstructive sleep apnea (adult) (pediatric): Secondary | ICD-10-CM | POA: Diagnosis present

## 2015-02-25 DIAGNOSIS — Z955 Presence of coronary angioplasty implant and graft: Secondary | ICD-10-CM | POA: Diagnosis not present

## 2015-02-25 DIAGNOSIS — Z9641 Presence of insulin pump (external) (internal): Secondary | ICD-10-CM | POA: Diagnosis present

## 2015-02-25 DIAGNOSIS — E785 Hyperlipidemia, unspecified: Secondary | ICD-10-CM | POA: Diagnosis not present

## 2015-02-25 DIAGNOSIS — I132 Hypertensive heart and chronic kidney disease with heart failure and with stage 5 chronic kidney disease, or end stage renal disease: Secondary | ICD-10-CM | POA: Diagnosis present

## 2015-02-25 DIAGNOSIS — E781 Pure hyperglyceridemia: Secondary | ICD-10-CM | POA: Diagnosis present

## 2015-02-25 DIAGNOSIS — Z89512 Acquired absence of left leg below knee: Secondary | ICD-10-CM | POA: Diagnosis not present

## 2015-02-25 LAB — LIPID PANEL
Cholesterol: 143 mg/dL (ref 0–200)
HDL: 29 mg/dL — ABNORMAL LOW (ref >40)
LDL Cholesterol: 63 mg/dL (ref 0–99)
Total CHOL/HDL Ratio: 4.9 RATIO
Triglycerides: 255 mg/dL — ABNORMAL HIGH (ref <150)
VLDL: 51 mg/dL — ABNORMAL HIGH (ref 0–40)

## 2015-02-25 LAB — TROPONIN I
Troponin I: <0.03 ng/mL (ref <0.031)
Troponin I: <0.03 ng/mL (ref <0.031)
Troponin I: <0.03 ng/mL (ref <0.031)

## 2015-02-25 LAB — GLUCOSE, CAPILLARY
Glucose-Capillary: 309 mg/dL — ABNORMAL HIGH (ref 65–99)
Glucose-Capillary: 292 mg/dL — ABNORMAL HIGH (ref 65–99)
Glucose-Capillary: 290 mg/dL — ABNORMAL HIGH (ref 65–99)
Glucose-Capillary: 363 mg/dL — ABNORMAL HIGH (ref 65–99)
Glucose-Capillary: 182 mg/dL — ABNORMAL HIGH (ref 65–99)

## 2015-02-25 LAB — BRAIN NATRIURETIC PEPTIDE: B Natriuretic Peptide: 444.3 pg/mL — ABNORMAL HIGH (ref 0.0–100.0)

## 2015-02-25 LAB — I-STAT TROPONIN, ED: Troponin i, poc: 0.01 ng/mL (ref 0.00–0.08)

## 2015-02-25 LAB — MRSA PCR SCREENING: MRSA by PCR: NEGATIVE

## 2015-02-25 MED ORDER — ZOLPIDEM TARTRATE 5 MG PO TABS
5.0000 mg | ORAL_TABLET | Freq: Every evening | ORAL | Status: DC | PRN
  Filled 2015-02-25: qty 1

## 2015-02-25 MED ORDER — HEPARIN (PORCINE) IN NACL 100-0.45 UNIT/ML-% IJ SOLN
1350.0000 [IU]/h | INTRAMUSCULAR | Status: DC
  Filled 2015-02-25: qty 250

## 2015-02-25 MED ORDER — REGADENOSON 0.4 MG/5ML IV SOLN
0.4000 mg | Freq: Once | INTRAVENOUS | Status: AC
  Administered 2015-02-25: 0.4 mg via INTRAVENOUS

## 2015-02-25 MED ORDER — RENA-VITE PO TABS
1.0000 | ORAL_TABLET | Freq: Every day | ORAL | Status: DC
  Administered 2015-02-26 – 2015-02-27 (×2): 1 via ORAL
  Filled 2015-02-25 (×2): qty 1

## 2015-02-25 MED ORDER — ACETAMINOPHEN 325 MG PO TABS
650.0000 mg | ORAL_TABLET | ORAL | Status: DC | PRN
  Administered 2015-02-25 – 2015-02-27 (×3): 650 mg via ORAL
  Filled 2015-02-25 (×3): qty 2

## 2015-02-25 MED ORDER — METOPROLOL SUCCINATE ER 25 MG PO TB24
12.5000 mg | ORAL_TABLET | Freq: Every day | ORAL | Status: DC
  Filled 2015-02-25: qty 1

## 2015-02-25 MED ORDER — PERFLUTREN LIPID MICROSPHERE
1.0000 mL | INTRAVENOUS | Status: AC | PRN
  Administered 2015-02-25: 2 mL via INTRAVENOUS
  Filled 2015-02-25: qty 10

## 2015-02-25 MED ORDER — FUROSEMIDE 80 MG PO TABS
80.0000 mg | ORAL_TABLET | Freq: Two times a day (BID) | ORAL | Status: DC
  Administered 2015-02-25 – 2015-02-27 (×4): 80 mg via ORAL
  Filled 2015-02-25 (×5): qty 1

## 2015-02-25 MED ORDER — NITROGLYCERIN IN D5W 200-5 MCG/ML-% IV SOLN
0.0000 ug/min | INTRAVENOUS | Status: DC
  Administered 2015-02-25: 5 ug/min via INTRAVENOUS
  Filled 2015-02-25: qty 250

## 2015-02-25 MED ORDER — ASPIRIN EC 325 MG PO TBEC
325.0000 mg | DELAYED_RELEASE_TABLET | Freq: Every day | ORAL | Status: DC
  Administered 2015-02-26: 325 mg via ORAL
  Filled 2015-02-25: qty 1

## 2015-02-25 MED ORDER — SODIUM CHLORIDE 0.9 % IV SOLN: 100.0000 mL | INTRAVENOUS | Status: DC | PRN

## 2015-02-25 MED ORDER — HYDROMORPHONE HCL 1 MG/ML IJ SOLN
INTRAMUSCULAR | Status: AC
  Filled 2015-02-25: qty 2

## 2015-02-25 MED ORDER — REGADENOSON 0.4 MG/5ML IV SOLN
INTRAVENOUS | Status: AC
  Filled 2015-02-25: qty 5

## 2015-02-25 MED ORDER — SEVELAMER CARBONATE 800 MG PO TABS
1600.0000 mg | ORAL_TABLET | Freq: Three times a day (TID) | ORAL | Status: DC
  Administered 2015-02-25 – 2015-02-27 (×7): 1600 mg via ORAL
  Filled 2015-02-25 (×8): qty 2

## 2015-02-25 MED ORDER — LIDOCAINE-PRILOCAINE 2.5-2.5 % EX CREA
1.0000 "application " | TOPICAL_CREAM | CUTANEOUS | Status: DC | PRN
  Filled 2015-02-25: qty 5

## 2015-02-25 MED ORDER — METOCLOPRAMIDE HCL 5 MG PO TABS
5.0000 mg | ORAL_TABLET | Freq: Three times a day (TID) | ORAL | Status: DC
  Administered 2015-02-25 – 2015-02-27 (×8): 5 mg via ORAL
  Filled 2015-02-25 (×9): qty 1

## 2015-02-25 MED ORDER — DIPHENHYDRAMINE HCL 25 MG PO CAPS
25.0000 mg | ORAL_CAPSULE | Freq: Four times a day (QID) | ORAL | Status: DC | PRN
  Administered 2015-02-25 – 2015-02-26 (×2): 25 mg via ORAL
  Filled 2015-02-25: qty 1

## 2015-02-25 MED ORDER — LEVOTHYROXINE SODIUM 100 MCG PO TABS
100.0000 ug | ORAL_TABLET | Freq: Every day | ORAL | Status: DC
  Administered 2015-02-25 – 2015-02-27 (×3): 100 ug via ORAL
  Filled 2015-02-25 (×4): qty 1

## 2015-02-25 MED ORDER — ROPINIROLE HCL 1 MG PO TABS
2.0000 mg | ORAL_TABLET | Freq: Every day | ORAL | Status: DC
  Administered 2015-02-25 – 2015-02-26 (×2): 2 mg via ORAL
  Filled 2015-02-25 (×2): qty 2

## 2015-02-25 MED ORDER — HEPARIN SODIUM (PORCINE) 5000 UNIT/ML IJ SOLN
5000.0000 [IU] | Freq: Three times a day (TID) | INTRAMUSCULAR | Status: DC
  Administered 2015-02-25 – 2015-02-27 (×3): 5000 [IU] via SUBCUTANEOUS
  Filled 2015-02-25 (×4): qty 1

## 2015-02-25 MED ORDER — LORATADINE 10 MG PO TABS
10.0000 mg | ORAL_TABLET | Freq: Every day | ORAL | Status: DC
  Administered 2015-02-26 – 2015-02-27 (×2): 10 mg via ORAL
  Filled 2015-02-25 (×2): qty 1

## 2015-02-25 MED ORDER — FAMOTIDINE 20 MG PO TABS
20.0000 mg | ORAL_TABLET | Freq: Every day | ORAL | Status: DC
  Administered 2015-02-26 – 2015-02-27 (×2): 20 mg via ORAL
  Filled 2015-02-25 (×2): qty 1

## 2015-02-25 MED ORDER — DOCUSATE SODIUM 100 MG PO CAPS
200.0000 mg | ORAL_CAPSULE | Freq: Three times a day (TID) | ORAL | Status: DC
  Administered 2015-02-25 – 2015-02-27 (×6): 200 mg via ORAL
  Filled 2015-02-25 (×6): qty 2

## 2015-02-25 MED ORDER — ASPIRIN 81 MG PO TABS: 81.0000 mg | ORAL_TABLET | Freq: Every evening | ORAL | Status: DC

## 2015-02-25 MED ORDER — CALCIUM ACETATE (PHOS BINDER) 667 MG PO CAPS
1334.0000 mg | ORAL_CAPSULE | Freq: Three times a day (TID) | ORAL | Status: DC
  Administered 2015-02-25 – 2015-02-27 (×7): 1334 mg via ORAL
  Filled 2015-02-25 (×8): qty 2

## 2015-02-25 MED ORDER — ATORVASTATIN CALCIUM 40 MG PO TABS
40.0000 mg | ORAL_TABLET | Freq: Every evening | ORAL | Status: DC
  Administered 2015-02-25 – 2015-02-26 (×2): 40 mg via ORAL
  Filled 2015-02-25 (×2): qty 1

## 2015-02-25 MED ORDER — HYDROMORPHONE HCL 1 MG/ML IJ SOLN
1.0000 mg | INTRAMUSCULAR | Status: DC | PRN
  Administered 2015-02-25 – 2015-02-27 (×11): 2 mg via INTRAVENOUS
  Filled 2015-02-25 (×11): qty 2

## 2015-02-25 MED ORDER — HEPARIN BOLUS VIA INFUSION
4000.0000 [IU] | Freq: Once | INTRAVENOUS | Status: DC
  Filled 2015-02-25: qty 4000

## 2015-02-25 MED ORDER — INSULIN ASPART 100 UNIT/ML ~~LOC~~ SOLN: 0.0000 [IU] | SUBCUTANEOUS | Status: DC

## 2015-02-25 MED ORDER — HEPARIN SODIUM (PORCINE) 1000 UNIT/ML DIALYSIS: 1000.0000 [IU] | INTRAMUSCULAR | Status: DC | PRN

## 2015-02-25 MED ORDER — GI COCKTAIL ~~LOC~~: 30.0000 mL | Freq: Four times a day (QID) | ORAL | Status: DC | PRN

## 2015-02-25 MED ORDER — HYDROMORPHONE HCL 1 MG/ML IJ SOLN
1.0000 mg | INTRAMUSCULAR | Status: DC | PRN
  Administered 2015-02-25 (×2): 1 mg via INTRAVENOUS
  Filled 2015-02-25 (×2): qty 1

## 2015-02-25 MED ORDER — INSULIN PUMP
SUBCUTANEOUS | Status: DC
  Administered 2015-02-25: 8.45 via SUBCUTANEOUS
  Administered 2015-02-25: 8.15 via SUBCUTANEOUS
  Administered 2015-02-25: 8.5 via SUBCUTANEOUS
  Administered 2015-02-25: 9.45 via SUBCUTANEOUS
  Administered 2015-02-26: 6.05 via SUBCUTANEOUS
  Administered 2015-02-26: 11.8 via SUBCUTANEOUS
  Administered 2015-02-26: 7.45 via SUBCUTANEOUS
  Administered 2015-02-26: 11.05 via SUBCUTANEOUS
  Administered 2015-02-26: 6.7 via SUBCUTANEOUS
  Administered 2015-02-27: 5.4 via SUBCUTANEOUS
  Administered 2015-02-27 (×2): via SUBCUTANEOUS
  Filled 2015-02-25: qty 1

## 2015-02-25 MED ORDER — PRASUGREL HCL 10 MG PO TABS
10.0000 mg | ORAL_TABLET | Freq: Every day | ORAL | Status: DC
  Administered 2015-02-26 – 2015-02-27 (×2): 10 mg via ORAL
  Filled 2015-02-25 (×2): qty 1

## 2015-02-25 MED ORDER — ALTEPLASE 2 MG IJ SOLR
2.0000 mg | Freq: Once | INTRAMUSCULAR | Status: DC | PRN
  Filled 2015-02-25: qty 2

## 2015-02-25 MED ORDER — PENTAFLUOROPROP-TETRAFLUOROETH EX AERO: 1.0000 "application " | INHALATION_SPRAY | CUTANEOUS | Status: DC | PRN

## 2015-02-25 MED ORDER — HYDROMORPHONE HCL 2 MG PO TABS: 2.0000 mg | ORAL_TABLET | Freq: Three times a day (TID) | ORAL | Status: DC | PRN

## 2015-02-25 MED ORDER — NITROGLYCERIN 0.4 MG SL SUBL: 0.4000 mg | SUBLINGUAL_TABLET | SUBLINGUAL | Status: DC | PRN

## 2015-02-25 MED ORDER — HEPARIN SODIUM (PORCINE) 1000 UNIT/ML DIALYSIS: 3700.0000 [IU] | Freq: Once | INTRAMUSCULAR | Status: DC

## 2015-02-25 MED ORDER — ALLOPURINOL 100 MG PO TABS
200.0000 mg | ORAL_TABLET | Freq: Every day | ORAL | Status: DC
  Administered 2015-02-26 – 2015-02-27 (×2): 200 mg via ORAL
  Filled 2015-02-25 (×3): qty 2

## 2015-02-25 MED ORDER — DIPHENHYDRAMINE HCL 25 MG PO CAPS
ORAL_CAPSULE | ORAL | Status: AC
  Filled 2015-02-25: qty 1

## 2015-02-25 MED ORDER — ONDANSETRON HCL 4 MG/2ML IJ SOLN
4.0000 mg | Freq: Four times a day (QID) | INTRAMUSCULAR | Status: DC | PRN
  Administered 2015-02-25 – 2015-02-26 (×3): 4 mg via INTRAVENOUS
  Filled 2015-02-25 (×3): qty 2

## 2015-02-25 MED ORDER — LIDOCAINE HCL (PF) 1 % IJ SOLN: 5.0000 mL | INTRAMUSCULAR | Status: DC | PRN

## 2015-02-25 MED ORDER — GLUCAGON HCL RDNA (DIAGNOSTIC) 1 MG IJ SOLR: 1.0000 mg | Freq: Every day | INTRAMUSCULAR | Status: DC | PRN

## 2015-02-25 MED ORDER — ATORVASTATIN CALCIUM 10 MG PO TABS: 10.0000 mg | ORAL_TABLET | Freq: Every evening | ORAL | Status: DC

## 2015-02-25 NOTE — Progress Notes (Signed)
Hemodialysis-Tolerated well without issue, 4L UF removed per order. Pt has no complaints. Report called to floor.

## 2015-02-25 NOTE — Progress Notes (Signed)
Patient presented for Lexiscan. Tolerated procedure well. Today is a day 1 of 2 day study. Result to follow.   Chamberlain Steinborn, [place]

## 2015-02-25 NOTE — Progress Notes (Signed)
PATIENT DETAILS Name: Dale [name] Age: 49 y.o. Sex: male Date of Birth: [birth year] Admit Date: 02/24/2015 Admitting Physician Allyne Gee, MD [id number] T, MD  Subjective: -Still complaining of chest pain, some mild SOB    Assessment/Plan:  1. Chest Pain  -Patient presented to the ED 11/1 with complaints of CP and SOB, found to be hypertensive in the ED with systolic 0000000 -PCI performed 02/2014 with PCI of RCA and diagonal 80% occluded, managed medically  -Cyclic troponins negative, cardiology consulted and placed patient on NTG gtt and metoprolol -Plan for myoview and echo today  -Continue NTG, labetalol, lasix and Dilaudid PRN   2. Hypertensive Emergency  -Presented to the ED with BP 190-200s despite increased metoprolol dose at home -Cardiology consulted and unsure if CP attributed to HTN vs ACS, myoview planned today  -Still hypertensive but BPs improving with NTG gtt - 156/79 this AM  -Will continue to monitor BP, continue NTG, labetalol   3. ESRD on Dialysis -Typically has dialysis MWF, had regular session on Monday, is continuing to make urine  -Consulted nephrology with plan for dialysis today  4. DM Type II  -Hgb A1C 09/17/14 - 7.2  -Elevated CBG since admission 200-300 -Continue insulin pump    5. Hyperlipidemia -Will order lipid panel today   -Continue Lipitor   6. Hypothyroidism -Continue Synthroid   Disposition: Remain inpatient  Antimicrobial agents  See below  Anti-infectives    None      DVT Prophylaxis: Anticoagulated with Heparin   Code Status: Full code  Family Communication No family present at bedside at this time   Procedures: None   CONSULTS:  cardiology and nephrology  Time spent 40 minutes-Greater than 50% of this time was spent in counseling, explanation of diagnosis, planning of further management, and coordination of care.  MEDICATIONS: Scheduled Meds: . allopurinol  200 mg  Oral Daily  . aspirin EC  325 mg Oral Daily  . atorvastatin  40 mg Oral QPM  . calcium acetate  1,334 mg Oral TID WC  . docusate sodium  200 mg Oral TID  . famotidine  20 mg Oral Daily  . furosemide  80 mg Oral BID  . heparin subcutaneous  5,000 Units Subcutaneous 3 times per day  . insulin pump   Subcutaneous 6 times per day  . levothyroxine  100 mcg Oral QAC breakfast  . loratadine  10 mg Oral Daily  . metoCLOPramide  5 mg Oral TID PC & HS  . metoprolol succinate  12.5 mg Oral Daily  . multivitamin  1 tablet Oral Daily  . prasugrel  10 mg Oral Daily  . regadenoson      . rOPINIRole  2 mg Oral Q supper  . sevelamer carbonate  1,600 mg Oral TID WC   Continuous Infusions: . nitroGLYCERIN 20 mcg/min (02/25/15 1330)   PRN Meds:.acetaminophen, diphenhydrAMINE, gi cocktail, glucagon (human recombinant), HYDROmorphone (DILAUDID) injection, nitroGLYCERIN, ondansetron (ZOFRAN) IV, zolpidem    PHYSICAL EXAM: Vital signs in last 24 hours: Filed Vitals:   02/25/15 1141 02/25/15 1143 02/25/15 1144 02/25/15 1253  BP: 162/85 164/90  180/87  Pulse: 86 87 88 87  Temp:    98.3 F (36.8 C)  TempSrc:    Oral  Resp:      Height:      Weight:      SpO2:    100%    Weight change:  Filed Weights   02/24/15 2206 02/25/15 0500  Weight: 124.286 kg (274 lb) 129 kg (284 lb 6.3 oz)   Body mass index is 43.25 kg/(m^2).   Gen Exam: Awake and alert with clear speech.  Sitting upright in bed, NAD  Neck: Supple, No JVD.   Chest: Clear and equal bilaterally. CVS: S1 S2 Regular, no murmurs.  Abdomen: soft, BS +, non tender, non distended.  Extremities: no edema, s/p L BKA  Neurologic: Non Focal.   Skin: No Rash.   Wounds: N/A.    Intake/Output from previous day:  Intake/Output Summary (Last 24 hours) at 02/25/15 1528 Last data filed at 02/25/15 1430  Gross per 24 hour  Intake 251.73 ml  Output     60 ml  Net 191.73 ml     LAB RESULTS: CBC  Recent Labs Lab 02/24/15 2229  WBC 5.1   HGB 13.1  HCT 40.9  PLT 177  MCV 100.0  MCH 32.0  MCHC 32.0  RDW 15.5    Chemistries   Recent Labs Lab 02/24/15 2229  NA 134*  K 5.1  CL 94*  CO2 27  GLUCOSE 271*  BUN 39*  CREATININE 8.44*  CALCIUM 9.8    CBG:  Recent Labs Lab 02/25/15 0305 02/25/15 0739 02/25/15 1314  GLUCAP 309* 292* 290*    GFR Estimated Creatinine Clearance: 13.9 mL/min (by C-G formula based on Cr of 8.44).  Coagulation profile No results for input(s): INR, PROTIME in the last 168 hours.  Cardiac Enzymes  Recent Labs Lab 02/25/15 0324 02/25/15 0520 02/25/15 0800  TROPONINI <0.03 <0.03 <0.03    Invalid input(s): POCBNP No results for input(s): DDIMER in the last 72 hours. No results for input(s): HGBA1C in the last 72 hours.  Recent Labs  02/25/15 1400  CHOL 143  HDL 29*  LDLCALC 63  TRIG 255*  CHOLHDL 4.9   No results for input(s): TSH, T4TOTAL, T3FREE, THYROIDAB in the last 72 hours.  Invalid input(s): FREET3 No results for input(s): VITAMINB12, FOLATE, FERRITIN, TIBC, IRON, RETICCTPCT in the last 72 hours. No results for input(s): LIPASE, AMYLASE in the last 72 hours.  Urine Studies No results for input(s): UHGB, CRYS in the last 72 hours.  Invalid input(s): UACOL, UAPR, USPG, UPH, UTP, UGL, UKET, UBIL, UNIT, UROB, ULEU, UEPI, UWBC, URBC, UBAC, CAST, UCOM, BILUA  MICROBIOLOGY: Recent Results (from the past 240 hour(s))  MRSA PCR Screening     Status: None   Collection Time: 02/25/15  2:05 AM  Result Value Ref Range Status   MRSA by PCR NEGATIVE NEGATIVE Final    Comment:        The GeneXpert MRSA Assay (FDA approved for NASAL specimens only), is one component of a comprehensive MRSA colonization surveillance program. It is not intended to diagnose MRSA infection nor to guide or monitor treatment for MRSA infections.     RADIOLOGY STUDIES/RESULTS: Dg Chest 2 View  02/24/2015  CLINICAL DATA:  Chest pain with dizziness for 2 hours. Diabetes. Dialysis  patient. EXAM: CHEST  2 VIEW COMPARISON:  01/21/2015 FINDINGS: Right central venous catheter with tip over the cavoatrial junction. No change in position since previous study. No pneumothorax. Mild cardiac enlargement and pulmonary vascular congestion demonstrating mild progression since previous study. No focal airspace disease or consolidation. No edema. Blunting of the costophrenic angle on the right suggesting small pleural effusion. Mediastinal contours appear intact. Mild degenerative changes in the spine. IMPRESSION: Cardiac enlargement with mild vascular congestion and small right pleural  effusion. Electronically Signed   By: Lucienne Capers M.D.   On: 02/24/2015 22:33    [facility] Pager:[phone]  If 7PM-7AM, please contact night-coverage [email or web address] Password [email or web address] 02/25/2015, 3:28 PM   LOS: 0 days

## 2015-02-25 NOTE — Consult Note (Signed)
CARDIOLOGY CONSULT NOTE  Patient ID: Dale [name] MRN: [id number] DOB/AGE: [birth year] 49 y.o.  Admit date: 02/24/2015 Primary Cardiologist: Dr [name] Reason for Consultation: Chest pain, NSTEMI  HPI: 49 yo with history of CAD, HTN, ESRD, and chronic diastolic CHF presented with chest pain and hypertensive emergency (BP 218/110 at home).  He had PCI with DES to [facility] in 10/15. Since then, he has not had chest pain.  He is s/p left BKA and walks on a prosthesis.  He has HD MWF.  Around 8:45 pm today, he felt "funny" => headache, chest pressure.  He took his BP, it was 218/110.  He does not typically have high BP.  He has not missed HD.  He has been getting down to his dry weight at HD. The chest pressure worsened, it was like a band across his chest.  He came to the ER.  Chest pressure improved from 9/10 to 5/10 with NTG and Dilaudid.  BP remained elevated in the ER.  Initially, a troponin of 0.23 was reported.  However, this was actually on the wrong patient and troponin has not been sent yet.   Review of systems complete and found to be negative unless listed above in HPI  Past Medical History: 1. Type II diabetes 2. HTN 3. OSA 4. Fibromyalgia 5. ESRD: 1995 had kidney transplant, failed and started HD in 4/13.  MWF HD.  6. Anemia of renal disease. 7. S/p left BKA 8. Salmonella bacteremia in 3/14.  9. IV contrast allergy 10. CAD: Abnormal Cardiolite in 10/15 followed by LHC showing 99% mRCA and 80% mid to distal diagonal.  He had POBA mRCA followed by atherectomy and DES to [facility].   11. Gout 12. Left groin AV fistula s/p cath.  13. Hyperlipidemia 14. H/o pericardial effusion. 15. GERD 16. Cholecystitis 17. Chronic diastolic CHF: Echo (0000000) with EF 55-60%, severe LVH.   Current Facility-Administered Medications  Medication Dose Route Frequency Provider Last Rate Last Dose  . nitroGLYCERIN (NITROGLYN) 2 % ointment 1 inch  1 inch Topical Once Julianne Rice, MD        Current Outpatient Prescriptions  Medication Sig Dispense Refill  . allopurinol (ZYLOPRIM) 100 MG tablet Take 200 mg by mouth daily.     [name] Kitchen aspirin 81 MG tablet Take 81 mg by mouth every evening.     [name] Kitchen atorvastatin (LIPITOR) 10 MG tablet Take 10 mg by mouth every evening.     . calcium acetate (PHOSLO) 667 MG capsule Take 667-1,334 mg by mouth See admin instructions. Take 1,334 mg (2 capsules) with meals and 667 (1 capsule) with snacks    . diphenhydrAMINE (BENADRYL) 25 mg capsule Take 1-2 capsules (25-50 mg total) by mouth every 6 (six) hours as needed for itching. 30 capsule 0  . docusate sodium (COLACE) 100 MG capsule Take 200 mg by mouth 3 (three) times daily.     [name] Kitchen EFFIENT 10 MG TABS tablet TAKE 1 TABLET BY MOUTH EVERY DAY 30 tablet 0  . furosemide (LASIX) 80 MG tablet Take 80 mg by mouth 2 (two) times daily.   98  . GLUCAGON EMERGENCY 1 MG injection Inject 1 mg into the muscle daily as needed (low blood sugar).     . hydrocortisone 2.5 % cream APPLY 2-3 TIMES DAILY AS NEEDED TO AFFECTED AREA(S).  1  . HYDROmorphone (DILAUDID) 2 MG tablet Take 1 tablet (2 mg total) by mouth 3 (three) times daily as needed for severe pain. [place]  tablet 0  . Insulin Human (INSULIN PUMP) 100 unit/ml SOLN Inject into the skin continuous. Humalog    . levothyroxine (SYNTHROID, LEVOTHROID) 100 MCG tablet Take 100 mcg by mouth daily before breakfast.    . loratadine (CLARITIN) 10 MG tablet Take 10 mg by mouth daily.    . metoCLOPramide (REGLAN) 5 MG tablet Take 5 mg by mouth 4 (four) times daily - after meals and at bedtime.    . metoprolol succinate (TOPROL-XL) 25 MG 24 hr tablet Take 0.5 tablets (12.5 mg total) by mouth as directed. Take 12.5 mg on Tu, Th, Sat and Sun; HOLD on dialysis days M, W, F (Patient taking differently: Take 12.5 mg by mouth daily. )    . multivitamin (RENA-VIT) TABS tablet Take 1 tablet by mouth daily.    . nitroGLYCERIN (NITROSTAT) 0.4 MG SL tablet Place 0.4 mg under the tongue every 5  (five) minutes as needed for chest pain.     . promethazine (PHENERGAN) 25 MG tablet Take 1 tablet (25 mg total) by mouth every 6 (six) hours as needed for nausea or vomiting. For nausea 30 tablet 0  . ranitidine (ZANTAC) 150 MG tablet Take 150 mg by mouth 2 (two) times daily.  4  . rOPINIRole (REQUIP) 2 MG tablet Take 2 mg by mouth daily with supper.     . sevelamer carbonate (RENVELA) 800 MG tablet Take 1,600 mg by mouth 3 (three) times daily with meals.     Facility-Administered Medications Ordered in Other Encounters  Medication Dose Route Frequency Provider Last Rate Last Dose  . 0.9 %  sodium chloride infusion   Intravenous Continuous Angelia Mould, MD      . Chlorhexidine Gluconate Cloth 2 % PADS 6 each  6 each Topical Once Angelia Mould, MD         Family History  Problem Relation Age of Onset  . Malignant hyperthermia Neg Hx   . Heart attack Neg Hx   . Stroke Neg Hx   . Colon polyps Father   . Diabetes Father   . Hypertension Father   . Breast cancer Maternal Aunt   . Cancer Maternal Aunt     Breast and Bone  . Hypertension Mother   . Diabetes Mother   . Hyperlipidemia Mother     Social History   Social History  . Marital Status: Married    Spouse Name: N/A  . Number of Children: N/A  . Years of Education: N/A   Occupational History  . Disabled    Social History Main Topics  . Smoking status: Never Smoker   . Smokeless tobacco: Never Used  . Alcohol Use: Yes     Comment: 11/19/14 "might have a drink 2-3 times/year"  . Drug Use: No  . Sexual Activity: Not Currently   Other Topics Concern  . Not on file   Social History Narrative   Lives @ home with his wife in [place].      Physical exam Blood pressure 180/79, pulse 82, temperature 97.8 F (36.6 C), temperature source Oral, resp. rate 12, height 5\' 8"  (1.727 m), weight 274 lb (124.286 kg), SpO2 93 %. General: NAD Neck: Thick, JVP difficult, no thyromegaly or thyroid nodule.  Lungs:  Clear to auscultation bilaterally with normal respiratory effort. CV: Nondisplaced PMI.  Heart regular S1/S2, no S3/S4, no murmur.  Right lower leg wrapped with 1+ ankle edema.  No carotid bruit.   Abdomen: Soft, nontender, no hepatosplenomegaly, no distention.  Skin:  Intact without lesions or rashes.  Neurologic: Alert and oriented x 3.  Psych: Normal affect. Extremities: No clubbing or cyanosis. S/p left BKA.  HEENT: Normal.   Labs:   Lab Results  Component Value Date   WBC 5.1 02/24/2015   HGB 13.1 02/24/2015   HCT 40.9 02/24/2015   MCV 100.0 02/24/2015   PLT 177 02/24/2015    Recent Labs Lab 02/24/15 2229  NA 134*  K 5.1  CL 94*  CO2 27  BUN 39*  CREATININE 8.44*  CALCIUM 9.8  GLUCOSE 271*   TnI 0.23  Radiology: - CXR: Mild vascular congestion  EKG: NSR, normal  ASSESSMENT AND PLAN: 49 yo with history of CAD, HTN, ESRD, and chronic diastolic CHF presented with chest pain and hypertensive emergency (BP 218/110 at home).  1. Chest pain: Possible unstable angina versus NSTEMI.  The initially reported troponin of 0.23 was for the wrong patient, awaiting troponin level.  ECG was normal.  Certainly, his symptoms could all be due to a hypertensive emergency.  He has known CAD with PCI to RCA in 2015 and remains on Effient.   - If troponin is negative, will likely need stress test (inpatient versus outpatient).  - If troponin mildly positive without trend, would plan stress test.  - If troponin elevated and trending up, would plan cath.  However, need to be aware that he is allergic to contrast.  - For now, treat with ASA 81, statin, NTG gtt, and beta blocker.  If troponin is elevated, would start heparin gtt.  2. Hypertensive emergency with chest pain: Etiology uncertain.  He has not missed HD and has been dialyzing to his dry weight. He does not appear volume overloaded though exam is difficult.  For now, would treat BP with NTG gtt + metoprolol.  Interestingly, he really  does not seem to have had much problem with HTN in the past.  3. ESRD: Needs HD MWF.  Still makes urine so on Lasix at home.  4. Chronic diastolic CHF: He does not appear particularly volume overloaded on exam.   Loralie Champagne 02/25/2015 12:53 AM

## 2015-02-25 NOTE — ED Notes (Signed)
Spoke to Dr. Humphrey Rolls, updated on new Troponin result

## 2015-02-25 NOTE — Progress Notes (Signed)
Patient Name: Dale [name] Date of Encounter: 02/25/2015  Principal Problem:   Chest pain Active Problems:   ESRD (end stage renal disease) on dialysis ([place]) M-W-F   HLD (hyperlipidemia)   Accelerated hypertension   DM (diabetes mellitus) type II controlled with renal manifestation [facility])   Primary Cardiologist: Dr [name]  Patient Profile: 49 yo with history of CAD (RCA stent 2015), HTN, ESRD, and chronic diastolic CHF admitted [id number] with HTN emergency (218/110) and cards saw for chest pain.  SUBJECTIVE: Still with 4/10 chest pain, has not been a zero since it started last night. Better with pain meds. Creeping back up now (BP going up now too). HD on M-W-F, due today.  OBJECTIVE Filed Vitals:   02/25/15 0200 02/25/15 0300 02/25/15 0345 02/25/15 0500  BP: 182/92 189/97 154/74 156/79  Pulse:  93 88 85  Temp: 98 F (36.7 C)   97.7 F (36.5 C)  TempSrc: Oral   Oral  Resp: 18 24 20 18   Height:    5\' 8"  (1.727 m)  Weight:    284 lb 6.3 oz (129 kg)  SpO2: 99% 100% 99% 99%    Intake/Output Summary (Last 24 hours) at 02/25/15 0843 Last data filed at 02/25/15 0700  Gross per 24 hour  Intake  11.73 ml  Output     60 ml  Net -48.27 ml   Filed Weights   02/24/15 2206 02/25/15 0500  Weight: 274 lb (124.286 kg) 284 lb 6.3 oz (129 kg)    PHYSICAL EXAM General: Well developed, well nourished, male in no acute distress. Head: Normocephalic, atraumatic.  Neck: Supple without bruits, JVD not elevated, difficult to assess 2nd body habitus. Lungs:  Resp regular and unlabored, few rales. Heart: RRR, S1, S2, no S3, S4, or murmur; no rub. Abdomen: Soft, non-tender, non-distended, BS + x 4.  Extremities: No clubbing, cyanosis, edema. S/p L BKA  Neuro: Alert and oriented X 3. Moves all extremities spontaneously. Psych: Normal affect.  LABS: CBC:  Recent Labs  02/24/15 2229  WBC 5.1  HGB 13.1  HCT 40.9  MCV 100.0  PLT 123XX123   Basic Metabolic Panel:  Recent Labs  02/24/15 2229  NA 134*  K 5.1  CL 94*  CO2 27  GLUCOSE 271*  BUN 39*  CREATININE 8.44*  CALCIUM 9.8   Cardiac Enzymes:  Recent Labs  02/25/15 0324 02/25/15 0520  TROPONINI <0.03 <0.03    Recent Labs  02/25/15 0049  TROPIPOC 0.01   BNP:  B NATRIURETIC PEPTIDE  Date/Time Value Ref Range Status  02/25/2015 12:03 AM 444.3* 0.0 - 100.0 pg/mL Final   TELE:  SR, PACs  ECG: SR, no sig change from 12/2014 ECG  Radiology/Studies: Dg Chest 2 View 02/24/2015  CLINICAL DATA:  Chest pain with dizziness for 2 hours. Diabetes. Dialysis patient. EXAM: CHEST  2 VIEW COMPARISON:  01/21/2015 FINDINGS: Right central venous catheter with tip over the cavoatrial junction. No change in position since previous study. No pneumothorax. Mild cardiac enlargement and pulmonary vascular congestion demonstrating mild progression since previous study. No focal airspace disease or consolidation. No edema. Blunting of the costophrenic angle on the right suggesting small pleural effusion. Mediastinal contours appear intact. Mild degenerative changes in the spine. IMPRESSION: Cardiac enlargement with mild vascular congestion and small right pleural effusion. Electronically Signed   By: Lucienne Capers M.D.   On: 02/24/2015 22:33     Current Medications:  . allopurinol  200 mg Oral Daily  .  aspirin EC  325 mg Oral Daily  . atorvastatin  10 mg Oral QPM  . calcium acetate  1,334 mg Oral TID WC  . docusate sodium  200 mg Oral TID  . famotidine  20 mg Oral Daily  . furosemide  80 mg Oral BID  . heparin subcutaneous  5,000 Units Subcutaneous 3 times per day  . insulin pump   Subcutaneous 6 times per day  . levothyroxine  100 mcg Oral QAC breakfast  . loratadine  10 mg Oral Daily  . metoCLOPramide  5 mg Oral TID PC & HS  . metoprolol succinate  12.5 mg Oral Daily  . multivitamin  1 tablet Oral Daily  . prasugrel  10 mg Oral Daily  . rOPINIRole  2 mg Oral Q supper  . sevelamer carbonate  1,600 mg Oral  TID WC   . nitroGLYCERIN 10 mcg/min (02/25/15 0312)    ASSESSMENT AND PLAN: Principal Problem:   Chest pain - ez neg MI - in the setting of Hypertensive emergency - once BP improved, symptoms improved, but still present with SBP 150s - 02/2014, had PCI of the RCA but Diag 80%, rx medically - EF 55-60% by echo 08/2014 - MD advise on MV   Otherwise, per IM Active Problems:   ESRD (end stage renal disease) on dialysis ([place])   HLD (hyperlipidemia)   Accelerated hypertension   DM (diabetes mellitus) type II controlled with renal manifestation ([place])   Signed, Lenoard Aden 8:43 AM 02/25/2015  Personally seen and examined. Agree with above. 49 year old ESRD, amputation, prior normal EF, RCA stent, med mgt of 80% diag with CP.  - Trop normal  - ECG unchanged  - Will proceed with stress test, 2 day, realizing limitations in study based on morbid obesity.  Reviewed Dr. Aundra Dubin note.   Candee Furbish, MD

## 2015-02-25 NOTE — Progress Notes (Addendum)
Inpatient Diabetes Program Recommendations  AACE/ADA: New Consensus Statement on Inpatient Glycemic Control (2015)  Target Ranges:  Prepandial:   less than 140 mg/dL      Peak postprandial:   less than 180 mg/dL (1-2 hours)      Critically ill patients:  140 - 180 mg/dL    Results for DENNIS, SIGNS (MRN [id number]) as of 02/25/2015 13:42  Ref. Range 02/25/2015 03:05 02/25/2015 07:39 02/25/2015 13:14  Glucose-Capillary Latest Ref Range: 65-99 mg/dL 309 (H) 292 (H) 290 (H)    Admit with: CP  History: DM, ESRD, HTN  Home DM Meds: Insulin Pump  Current Insulin Orders: Insulin Pump    -Patient currently A&O X4 and able to independently manage insulin pump.  -Wife to bring extra insulin pump supplies to hospital tonight.  -Reviewed hospital policy of insulin pumps with patient.  Patient agreeable to CBG checks with hospital meter.  Asked patient to please communicate all insulin boluses given by patient to RN for documentation.  -Tried to assess why patient's CBGs have been elevated this admission.  Patient stated he just started a new nose spray at home for allergies and thinks this new nose spray has steroids in it that may have caused his glucose elevations.     Basal Rates: Midnight: 1.5 units/hr 3AM: 1.75 units/hr 8AM: 1.45 units/hr 12PM: 1.625 units/hr 4PM: 1.625 units/hr  Total Basal for 24 hour period= 38.55 units   Carbohydrate Ratio= 1 unit for every 7 grams carbohydrates eaten by patient   Correction/Sensitivity Factor= 1 unit for every 20 mg/dl above target CBG 120 mg/dl   Target CBG= 120 mg/dl    --Will follow patient during hospitalization--  Wyn Quaker RN, MSN, CDE Diabetes Coordinator Inpatient Glycemic Control Team Team Pager: [phone] (8a-5p)

## 2015-02-25 NOTE — Progress Notes (Signed)
Patient's CBG 309, notified K. Schorr. Patient managing insulin pump. No other labs needed at this time.

## 2015-02-25 NOTE — Progress Notes (Signed)
ANTICOAGULATION CONSULT NOTE - Initial Consult  Pharmacy Consult for Heparin Indication: chest pain/ACS  Allergies  Allergen Reactions  . Morphine And Related Hives and Nausea Only  . Amoxicillin Other (See Comments)    Reaction unknown  . Ciprofloxacin Other (See Comments)    Reaction unknown  . Clindamycin/Lincomycin Other (See Comments)    Reaction unknown  . Codeine Other (See Comments)    Reaction unknown. But pt has tolerated hydrocodone in Vicodin and Norco in the past  . Contrast Media [Iodinated Diagnostic Agents] Rash  . Doxycycline Rash  . Hydrocodone Rash  . Levofloxacin Other (See Comments)    Reaction unknown  . Oxycodone Rash  . Plavix [Clopidogrel Bisulfate] Rash  . Rocephin [Ceftriaxone] Itching  . Vasotec Cough    Patient Measurements: Height: 5\' 8"  (172.7 cm) Weight: 274 lb (124.286 kg) IBW/kg (Calculated) : 68.4 Heparin Dosing Weight: 97 kg  Vital Signs: Temp: 97.8 F (36.6 C) (11/01 2206) Temp Source: Oral (11/01 2206) BP: 180/79 mmHg (11/01 2350) Pulse Rate: 82 (11/01 2350)  Labs:  Recent Labs  02/24/15 2229  HGB 13.1  HCT 40.9  PLT 177  CREATININE 8.44*    Estimated Creatinine Clearance: 13.6 mL/min (by C-G formula based on Cr of 8.44).   Medical History: Past Medical History  Diagnosis Date  . Hypertension   . Anemia of chronic disease   . Cholecystitis     a. 08/27/2011  . Constipation   . Pericardial effusion     a.  Small by CT 08/22/11;  b.  Large by CT 08/27/11  . Inguinal lymphadenopathy     a. bilateral - s/p biopsy 07/2011  . Fibromyalgia   . Chronic diastolic CHF (congestive heart failure) ([place])     a. Echo 3/13:  EF 55-60%;  b. Echo (11/15):  Mild LVH, EF 60-65%, no RWMA, Gr 1 DD, MAC, mild LAE, normal RVF, mild RAE;  c.  Echo 5/16:  severe LVH, EF 55-60%, no RWMA, Gr 1 DD, MAC, mild LAE  . HLD (hyperlipidemia)   . Hypothyroidism   . GERD (gastroesophageal reflux disease)   . CAD (coronary artery disease)     a.  Lexiscan Myoview (11/15):  Inf, apical cap and apical lateral ischemia, EF 56%, inf HK, Moderate Risk;  b. LHC (11/15):  mid to dist Dx 80%, mid RCA 99% (functional CTO), dist RCA 80% >> PCI: balloon angioplasty to mid RCA (could not deliver stent) - plan staged PCI with HSRA of RCA; Dx to be tx medically >> PCI: Rotoblator atherectomy/DES to [facility]  . OSA (obstructive sleep apnea)     adjustable bed  . Shortness of breath dyspnea     too much Fluid  . Pneumonia ~ 2007  . Insulin dependent diabetes mellitus (HCC)     Type I  . Claustrophobia     when things get around his face.   [name] Kitchen ESRD (end stage renal disease) ([place])     a. 1995 s/p cadaveric transplant w/ susbequent failure after 18 yrs;  b.Dialysis initiated 07/2011 [facility]  . Constipation   . Arthritis     "hands, right knee" (03/19/2014)  . History of blood transfusion     Medications:  See electronic med rec  Assessment: 49 y.o. male presents with CP. Baseline CBC ok. Noted pt with ESRD. To begin heparin for r/o ACS.  Goal of Therapy:  Heparin level 0.3-0.7 units/ml Monitor platelets by anticoagulation protocol: Yes   Plan:  Heparin IV bolus 4000  units Heparin gtt at 1350 units/hr Will f/u heparin level in 8 hours Daily heparin level and CBC  Sherlon Handing, PharmD, BCPS Clinical pharmacist, pager [phone] 02/25/2015,12:36 AM

## 2015-02-25 NOTE — Progress Notes (Signed)
*  PRELIMINARY RESULTS* Echocardiogram 2D Echocardiogram with definity has been performed.  [name] 02/25/2015, 4:37 PM

## 2015-02-25 NOTE — Consult Note (Signed)
Renal Service Consult Note Dale [name] 02/25/2015 Briannia Laba D Requesting Physician:  Dr Sloan Leiter  Reason for Consult:  ESRD patient w HTN'sive crisis HPI: The patient is a 49 y.o. year-old with hx of HTN, failed renal Tx, eSRD on HD MWF.  Presented with chest pain and pressure. BP was very high in ED 220/ 110.  Last HD was 2 d ago on Monday with 5.4 kg removed per patient.  Patient had tried taking his MTP at home w minimal improvement so he came to ED. Got IV labetalol in ED, CXR showed early pulm edema and today is stable w/o CP or SOB.  Wt's are up 5kg from EDW.    No abd pain/ n/v/d/ joitn pains/ rash/ seizures.   Past Medical History  Past Medical History  Diagnosis Date  . Hypertension   . Anemia of chronic disease   . Cholecystitis     a. 08/27/2011  . Constipation   . Pericardial effusion     a.  Small by CT 08/22/11;  b.  Large by CT 08/27/11  . Inguinal lymphadenopathy     a. bilateral - s/p biopsy 07/2011  . Fibromyalgia   . Chronic diastolic CHF (congestive heart failure) ([place])     a. Echo 3/13:  EF 55-60%;  b. Echo (11/15):  Mild LVH, EF 60-65%, no RWMA, Gr 1 DD, MAC, mild LAE, normal RVF, mild RAE;  c.  Echo 5/16:  severe LVH, EF 55-60%, no RWMA, Gr 1 DD, MAC, mild LAE  . HLD (hyperlipidemia)   . Hypothyroidism   . GERD (gastroesophageal reflux disease)   . CAD (coronary artery disease)     a. Lexiscan Myoview (11/15):  Inf, apical cap and apical lateral ischemia, EF 56%, inf HK, Moderate Risk;  b. LHC (11/15):  mid to dist Dx 80%, mid RCA 99% (functional CTO), dist RCA 80% >> PCI: balloon angioplasty to mid RCA (could not deliver stent) - plan staged PCI with HSRA of RCA; Dx to be tx medically >> PCI: Rotoblator atherectomy/DES to [facility]  . OSA (obstructive sleep apnea)     adjustable bed  . Shortness of breath dyspnea     too much Fluid  . Pneumonia ~ 2007  . Insulin dependent diabetes mellitus (HCC)     Type I  . Claustrophobia      when things get around his face.   [name] Kitchen ESRD (end stage renal disease) ([place])     a. 1995 s/p cadaveric transplant w/ susbequent failure after 18 yrs;  b.Dialysis initiated 07/2011 [facility] M-W-F  . Constipation   . Arthritis     "hands, right knee" (03/19/2014)  . History of blood transfusion    Past Surgical History  Past Surgical History  Procedure Laterality Date  . Kidney transplant  04/25/1993  . Cataract extraction w/ intraocular lens  implant, bilateral Bilateral 1990's  . Arteriovenous graft placement Left 1993?    forearm  . Arteriovenous graft placement Right 1993?    leg  . Toe amputation Bilateral after 2005  . Below knee leg amputation Left 2010  . Carpal tunnel release Bilateral after 2005  . Shoulder arthroscopy Left ~ 2006    frozen  . Av fistula placement  07/29/2011    Procedure: ARTERIOVENOUS (AV) FISTULA CREATION;  Surgeon: Mal Misty, MD;  Location: [place];  Service: Vascular;  Laterality: Right;  Brachial cephalic  . Insertion of dialysis catheter  08/01/2011    Procedure:  INSERTION OF DIALYSIS CATHETER;  Surgeon: Rosetta Posner, MD;  Location: [place];  Service: Vascular;  Laterality: Right;  insertion of dialysis catheter on right internal jugular vein  . Lymph node biopsy  08/10/2011    Procedure: LYMPH NODE BIOPSY;  Surgeon: Merrie Roof, MD;  Location: [place];  Service: General;  Laterality: Left;  left groin lymph biopsy  . Colonoscopy  08/29/2011    Procedure: COLONOSCOPY;  Surgeon: Jerene Bears, MD;  Location: [place];  Service: Gastroenterology;  Laterality: N/A;  . Appendectomy  ~ 2006  . Below knee leg amputation Left   . Umbilical hernia repair  1990's  . Cardiac catheterization  03/19/2014  . Av graft placement Left 1993?    "attempted one in my wrist; didn't take"  . Neuroplasty / transposition ulnar nerve at elbow Left after 2005  . Venogram Right 07/28/2011    Procedure: VENOGRAM;  Surgeon: Conrad Remy, MD;  Location: [facility] CATH LAB;  Service:  Cardiovascular;  Laterality: Right;  . Left heart catheterization with coronary angiogram N/A 03/19/2014    Procedure: LEFT HEART CATHETERIZATION WITH CORONARY ANGIOGRAM;  Surgeon: Burnell Blanks, MD;  Location: [facility] CATH LAB;  Service: Cardiovascular;  Laterality: N/A;  . Cardiac catheterization  03/19/2014    Procedure: CORONARY BALLOON ANGIOPLASTY;  Surgeon: Burnell Blanks, MD;  Location: [facility] CATH LAB;  Service: Cardiovascular;;  . Coronary angioplasty with stent placement  04/09/2014        ptca/des mid lad   . Angioplasty  04/09/2014    RCA   . Femoral artery repair  04/09/2014    ANGIOSEAL  . Percutaneous coronary rotoblator intervention (pci-r) N/A 04/09/2014    Procedure: PERCUTANEOUS CORONARY ROTOBLATOR INTERVENTION (PCI-R);  Surgeon: Burnell Blanks, MD;  Location: [facility] CATH LAB;  Service: Cardiovascular;  Laterality: N/A;  . Revison of arteriovenous fistula Right [id number]    Procedure: PLICATION / REVISION OF ARTERIOVENOUS FISTULA;  Surgeon: Angelia Mould, MD;  Location: [place];  Service: Vascular;  Laterality: Right;  . Peripheral vascular catheterization N/A 12/25/2014    Procedure: Upper Extremity Venography;  Surgeon: Conrad Eureka, MD;  Location: [place] CV LAB;  Service: Cardiovascular;  Laterality: N/A;  . Carpal tunnel release Bilateral   . Eye surgery Bilateral     cataract  . Amputation of replicated toes Right   . Vitrectomy Bilateral   . Peritoneal catheter insertion    . Peritoneal catheter removal    . Arteriovenous graft placement Left     Thigh  . Arteriovenous graft removed Left     Thigh  . Av fistula placement Left 01/20/2015    Procedure: LIGATION OF RIGHT ARTERIOVENOUS FISTULA WITH EXCISION OF ANEURYSM;  Surgeon: Angelia Mould, MD;  Location: [place];  Service: Vascular;  Laterality: Left;  . Insertion of dialysis catheter Right 01/20/2015    Procedure: INSERTION OF DIALYSIS CATHETER - RIGHT INTERNAL JUGULAR ;  Surgeon:  Angelia Mould, MD;  Location: [facility] OR;  Service: Vascular;  Laterality: Right;   Family History  Family History  Problem Relation Age of Onset  . Malignant hyperthermia Neg Hx   . Heart attack Neg Hx   . Stroke Neg Hx   . Colon polyps Father   . Diabetes Father   . Hypertension Father   . Breast cancer Maternal Aunt   . Cancer Maternal Aunt     Breast and Bone  . Hypertension Mother   . Diabetes Mother   .  Hyperlipidemia Mother    Social History  reports that he has never smoked. He has never used smokeless tobacco. He reports that he drinks alcohol. He reports that he does not use illicit drugs. Allergies  Allergies  Allergen Reactions  . Contrast Media [Iodinated Diagnostic Agents] Rash  . Morphine And Related Hives and Nausea Only  . Amoxicillin Other (See Comments)    Reaction unknown  . Ciprofloxacin Other (See Comments)    Reaction unknown  . Clindamycin/Lincomycin Other (See Comments)    Reaction unknown  . Codeine Other (See Comments)    Reaction unknown. But pt has tolerated hydrocodone in Vicodin and Norco in the past  . Doxycycline Rash  . Hydrocodone Rash  . Levofloxacin Other (See Comments)    Reaction unknown  . Oxycodone Rash  . Plavix [Clopidogrel Bisulfate] Rash  . Rocephin [Ceftriaxone] Itching  . Vasotec Cough   Home medications Prior to Admission medications   Medication Sig Start Date End Date Taking? Authorizing Provider  allopurinol (ZYLOPRIM) 100 MG tablet Take 200 mg by mouth daily.  03/14/12  Yes Historical Provider, MD  aspirin 81 MG tablet Take 81 mg by mouth every evening.    Yes Historical Provider, MD  atorvastatin (LIPITOR) 10 MG tablet Take 40 mg by mouth every evening.    Yes Historical Provider, MD  Beclomethasone Dipropionate (QNASL) 80 MCG/ACT AERS Place 2 sprays into both nostrils 2 (two) times daily.    Yes Historical Provider, MD  calcium acetate (PHOSLO) 667 MG capsule Take 667-1,334 mg by mouth See admin instructions.  Take 1,334 mg (2 capsules) with meals and 667 (1 capsule) with snacks 09/19/12  Yes Historical Provider, MD  diphenhydrAMINE (BENADRYL) 25 mg capsule Take 1-2 capsules (25-50 mg total) by mouth every 6 (six) hours as needed for itching. 04/11/14  Yes Rhonda G Barrett, PA-C  docusate sodium (COLACE) 100 MG capsule Take 200 mg by mouth 3 (three) times daily.    Yes Historical Provider, MD  EFFIENT 10 MG TABS tablet TAKE 1 TABLET BY MOUTH EVERY DAY 10/08/14  Yes Burnell Blanks, MD  furosemide (LASIX) 80 MG tablet Take 80 mg by mouth 2 (two) times daily.  11/25/14  Yes Historical Provider, MD  GLUCAGON EMERGENCY 1 MG injection Inject 1 mg into the muscle daily as needed (low blood sugar).  01/19/12  Yes Historical Provider, MD  hydrocortisone 2.5 % cream APPLY 2-3 TIMES DAILY AS NEEDED TO AFFECTED AREA(S). 10/30/14  Yes Historical Provider, MD  HYDROmorphone (DILAUDID) 2 MG tablet Take 1 tablet (2 mg total) by mouth 3 (three) times daily as needed for severe pain. 11/20/14  Yes Alvia Grove, PA-C  Insulin Human (INSULIN PUMP) 100 unit/ml SOLN Inject 72 each into the skin continuous. Humalog   Yes Historical Provider, MD  levothyroxine (SYNTHROID, LEVOTHROID) 100 MCG tablet Take 100 mcg by mouth at bedtime.    Yes Historical Provider, MD  loratadine (CLARITIN) 10 MG tablet Take 10 mg by mouth daily.   Yes Historical Provider, MD  metoCLOPramide (REGLAN) 5 MG tablet Take 5 mg by mouth 4 (four) times daily - after meals and at bedtime. 05/26/11  Yes Historical Provider, MD  metoprolol succinate (TOPROL-XL) 25 MG 24 hr tablet Take 0.5 tablets (12.5 mg total) by mouth as directed. Take 12.5 mg on Tu, Th, Sat and Sun; HOLD on dialysis days M, W, F Patient taking differently: Take 12.5 mg by mouth daily.  11/27/14  Yes Liliane Shi, PA-C  multivitamin (RENA-VIT)  TABS tablet Take 1 tablet by mouth daily.   Yes Historical Provider, MD  nitroGLYCERIN (NITROSTAT) 0.4 MG SL tablet Place 0.4 mg under the tongue  every 5 (five) minutes as needed for chest pain.    Yes [name], MD  promethazine (PHENERGAN) 25 MG tablet Take 1 tablet (25 mg total) by mouth every 6 (six) hours as needed for nausea or vomiting. For nausea 11/27/14  Yes Liliane Shi, PA-C  ranitidine (ZANTAC) 150 MG tablet Take 150 mg by mouth 2 (two) times daily. 11/01/14  Yes Historical Provider, MD  rOPINIRole (REQUIP) 2 MG tablet Take 2 mg by mouth daily with supper.    Yes Historical Provider, MD  sevelamer carbonate (RENVELA) 800 MG tablet Take 1,600 mg by mouth 3 (three) times daily with meals.   Yes Historical Provider, MD   Liver Function Tests No results for input(s): AST, ALT, ALKPHOS, BILITOT, PROT, ALBUMIN in the last 168 hours. No results for input(s): LIPASE, AMYLASE in the last 168 hours. CBC  Recent Labs Lab 02/24/15 2229  WBC 5.1  HGB 13.1  HCT 40.9  MCV 100.0  PLT 123XX123   Basic Metabolic Panel  Recent Labs Lab 02/24/15 2229  NA 134*  K 5.1  CL 94*  CO2 27  GLUCOSE 271*  BUN 39*  CREATININE 8.44*  CALCIUM 9.8    Filed Vitals:   02/25/15 1143 02/25/15 1144 02/25/15 1253 02/25/15 1545  BP: 164/90  180/87 147/60  Pulse: 87 88 87 88  Temp:   98.3 F (36.8 C) 98.6 F (37 C)  TempSrc:   Oral Oral  Resp:    14  Height:      Weight:      SpO2:   100% 100%   Exam Alert, no distress, calm No rash, cyanosis or gangrene Sclera anicteric, throat clear No jvd Chest is clear bilat RRR no MRG Abd obese soft ntnd no mass or ascites GU normal male Ext R leg in Unna boot Neuro is alert, O x 3   MWF [place]   4h  124kg   2/2.25 bath   R IJ cath  P2  Hep 3700 Fe 100 per HD Mircera 100 q2, last 10/26 Hect 2 ug  Assessment: 1. HTN'sive crisis - combination of vol excess in combination with taking OTC Sudafed PE which contains phenylephrine and is probably why his BP is so high. Counseled patient to avoid these meds for decongestants and to use antihistamines or steroids.   2. Vol  excess 3. ESRD on HD 4. Hx failed renal TX 5. Hx diast HF 6. IDDM   Plan- HD tonight, max UF. Stop taking decongestants.  Will follow.   Kelly Splinter MD [name] pager [phone]    cell [phone] 02/25/2015, 6:04 PM

## 2015-02-25 NOTE — Progress Notes (Signed)
UR Completed [facility] Graves-Bigelow, RN,BSN [phone]  

## 2015-02-26 DIAGNOSIS — E1122 Type 2 diabetes mellitus with diabetic chronic kidney disease: Secondary | ICD-10-CM

## 2015-02-26 DIAGNOSIS — R079 Chest pain, unspecified: Secondary | ICD-10-CM

## 2015-02-26 DIAGNOSIS — E785 Hyperlipidemia, unspecified: Secondary | ICD-10-CM

## 2015-02-26 DIAGNOSIS — I10 Essential (primary) hypertension: Secondary | ICD-10-CM

## 2015-02-26 LAB — NM MYOCAR MULTI W/SPECT W/WALL MOTION / EF
Estimated workload: 1 METS
Exercise duration (min): 5 min
Exercise duration (sec): 17 s
MPHR: 171 {beats}/min
Peak HR: 90 {beats}/min
Percent HR: 52 %
Rest HR: 83 {beats}/min

## 2015-02-26 LAB — HEMOGLOBIN A1C
Hgb A1c MFr Bld: 6.6 % — ABNORMAL HIGH (ref 4.8–5.6)
Mean Plasma Glucose: 143 mg/dL

## 2015-02-26 LAB — GLUCOSE, CAPILLARY
Glucose-Capillary: 125 mg/dL — ABNORMAL HIGH (ref 65–99)
Glucose-Capillary: 187 mg/dL — ABNORMAL HIGH (ref 65–99)
Glucose-Capillary: 212 mg/dL — ABNORMAL HIGH (ref 65–99)
Glucose-Capillary: 231 mg/dL — ABNORMAL HIGH (ref 65–99)
Glucose-Capillary: 241 mg/dL — ABNORMAL HIGH (ref 65–99)
Glucose-Capillary: 257 mg/dL — ABNORMAL HIGH (ref 65–99)

## 2015-02-26 MED ORDER — TECHNETIUM TC 99M SESTAMIBI - CARDIOLITE
30.0000 | Freq: Once | INTRAVENOUS | Status: AC | PRN
  Administered 2015-02-26: 30 via INTRAVENOUS

## 2015-02-26 MED ORDER — HYDRALAZINE HCL 20 MG/ML IJ SOLN
10.0000 mg | Freq: Four times a day (QID) | INTRAMUSCULAR | Status: DC | PRN
  Administered 2015-02-26: 10 mg via INTRAVENOUS
  Filled 2015-02-26: qty 1

## 2015-02-26 MED ORDER — TECHNETIUM TC 99M SESTAMIBI GENERIC - CARDIOLITE
30.0000 | Freq: Once | INTRAVENOUS | Status: AC | PRN
  Administered 2015-02-25: 30 via INTRAVENOUS

## 2015-02-26 MED ORDER — PROMETHAZINE HCL 25 MG/ML IJ SOLN
25.0000 mg | Freq: Four times a day (QID) | INTRAMUSCULAR | Status: DC | PRN
  Administered 2015-02-26: 25 mg via INTRAVENOUS
  Filled 2015-02-26 (×2): qty 1

## 2015-02-26 MED ORDER — METOPROLOL SUCCINATE ER 50 MG PO TB24
50.0000 mg | ORAL_TABLET | Freq: Every day | ORAL | Status: DC
  Administered 2015-02-26 – 2015-02-27 (×2): 50 mg via ORAL
  Filled 2015-02-26 (×2): qty 1

## 2015-02-26 MED ORDER — AMLODIPINE BESYLATE 5 MG PO TABS
5.0000 mg | ORAL_TABLET | Freq: Every day | ORAL | Status: DC
  Administered 2015-02-26: 5 mg via ORAL
  Filled 2015-02-26: qty 1

## 2015-02-26 NOTE — Progress Notes (Signed)
PATIENT DETAILS Name: Dale [name] Age: 49 y.o. Sex: male Date of Birth: 03/11/1966 Admit Date: 02/24/2015 Admitting Physician Allyne Gee, MD [id number] T, MD  Subjective: SOB is somewhat improved today, denies chest pain at this time   Assessment/Plan:  1. Chest Pain  -Patient presented to the ED 11/1 with complaints of CP and SOB, found to be hypertensive in the ED with systolic 0000000 -PCI performed 02/2014 with PCI of RCA and diagonal 80% occluded, managed medically  -Cyclic troponins negative, cardiology consulted and placed patient on NTG gtt and metoprolol -Echo performed 11/2 with EF 60-65%, moderate LVH and grade 2 diastolic dysfunction  -Chest pain has resolved, will continue to monitor   2. Hypertensive Emergency  -Presented to the ED with BP 190-200s despite increased metoprolol dose at home -Cardiology consulted and unsure if CP attributed to HTN vs ACS - myoview results pending  -Still hypertensive but BPs improving with NTG gtt - 149/77 this AM  -Will continue to monitor BP, NTG gtt titrated down and discontinued today, increased PO Metoprolol and added Amlodipine   3. ESRD on Dialysis -Typically has dialysis MWF, had regular session on Monday, is continuing to make urine  -Consulted nephrology with plan for dialysis tomorrow and to try and lower dry weight   4. DM Type II  -Hgb A1C 09/17/14 - 7.2  -Elevated CBG since admission 180-300 -Continue insulin pump    5. Hyperlipidemia -LDL 11/2 - 63, triglycerides elevated at 255 -Continue Lipitor   6. Hypothyroidism -Continue Synthroid   Disposition: Remain inpatient  Antimicrobial agents  See below  Anti-infectives    None      DVT Prophylaxis: Anticoagulated with Heparin   Code Status: Full code  Family Communication No family present at bedside at this time   Procedures: None   CONSULTS:  cardiology and nephrology  Time spent 30 minutes-Greater  than 50% of this time was spent in counseling, explanation of diagnosis, planning of further management, and coordination of care.  MEDICATIONS: Scheduled Meds: . allopurinol  200 mg Oral Daily  . amLODipine  5 mg Oral Daily  . aspirin EC  325 mg Oral Daily  . atorvastatin  40 mg Oral QPM  . calcium acetate  1,334 mg Oral TID WC  . docusate sodium  200 mg Oral TID  . famotidine  20 mg Oral Daily  . furosemide  80 mg Oral BID  . heparin subcutaneous  5,000 Units Subcutaneous 3 times per day  . insulin pump   Subcutaneous 6 times per day  . levothyroxine  100 mcg Oral QAC breakfast  . loratadine  10 mg Oral Daily  . metoCLOPramide  5 mg Oral TID PC & HS  . metoprolol succinate  50 mg Oral Daily  . multivitamin  1 tablet Oral Daily  . prasugrel  10 mg Oral Daily  . rOPINIRole  2 mg Oral Q supper  . sevelamer carbonate  1,600 mg Oral TID WC   Continuous Infusions: . nitroGLYCERIN 5 mcg/min (02/26/15 1145)   PRN Meds:.acetaminophen, diphenhydrAMINE, gi cocktail, glucagon (human recombinant), hydrALAZINE, HYDROmorphone (DILAUDID) injection, nitroGLYCERIN, promethazine, zolpidem    PHYSICAL EXAM: Vital signs in last 24 hours: Filed Vitals:   02/26/15 0957 02/26/15 1130 02/26/15 1142 02/26/15 1352  BP: 192/90 172/88 175/62 180/79  Pulse:  82    Temp:  97.9 F (36.6 C)    TempSrc:  Oral  Resp:  18    Height:      Weight:      SpO2:        Weight change: 3.214 kg (7 lb 1.4 oz) Filed Weights   02/25/15 0500 02/25/15 2008 02/25/15 2338  Weight: 129 kg (284 lb 6.3 oz) 127.5 kg (281 lb 1.4 oz) 123.8 kg (272 lb 14.9 oz)   Body mass index is 41.51 kg/(m^2).   Gen Exam: Awake and alert with clear speech.  Sitting upright in bed, NAD  Neck: Supple, No JVD.   Chest: Clear and equal bilaterally. CVS: S1 S2 Regular, no murmurs.  Abdomen: soft, BS +, non tender, non distended.  Extremities: no edema, s/p L BKA  Neurologic: Non Focal.   Skin: No Rash.   Wounds: N/A.     Intake/Output from previous day:  Intake/Output Summary (Last 24 hours) at 02/26/15 1358 Last data filed at 02/25/15 2338  Gross per 24 hour  Intake    480 ml  Output   4000 ml  Net  -3520 ml     LAB RESULTS: CBC  Recent Labs Lab 02/24/15 2229  WBC 5.1  HGB 13.1  HCT 40.9  PLT 177  MCV 100.0  MCH 32.0  MCHC 32.0  RDW 15.5    Chemistries   Recent Labs Lab 02/24/15 2229  NA 134*  K 5.1  CL 94*  CO2 27  GLUCOSE 271*  BUN 39*  CREATININE 8.44*  CALCIUM 9.8    CBG:  Recent Labs Lab 02/25/15 2029 02/26/15 0030 02/26/15 0403 02/26/15 0812 02/26/15 1120  GLUCAP 182* 241* 257* 212* 231*    GFR Estimated Creatinine Clearance: 13.6 mL/min (by C-G formula based on Cr of 8.44).  Coagulation profile No results for input(s): INR, PROTIME in the last 168 hours.  Cardiac Enzymes  Recent Labs Lab 02/25/15 0324 02/25/15 0520 02/25/15 0800  TROPONINI <0.03 <0.03 <0.03    Invalid input(s): POCBNP No results for input(s): DDIMER in the last 72 hours.  Recent Labs  02/25/15 0324  HGBA1C 6.6*    Recent Labs  02/25/15 1400  CHOL 143  HDL 29*  LDLCALC 63  TRIG 255*  CHOLHDL 4.9   No results for input(s): TSH, T4TOTAL, T3FREE, THYROIDAB in the last 72 hours.  Invalid input(s): FREET3 No results for input(s): VITAMINB12, FOLATE, FERRITIN, TIBC, IRON, RETICCTPCT in the last 72 hours. No results for input(s): LIPASE, AMYLASE in the last 72 hours.  Urine Studies No results for input(s): UHGB, CRYS in the last 72 hours.  Invalid input(s): UACOL, UAPR, USPG, UPH, UTP, UGL, UKET, UBIL, UNIT, UROB, ULEU, UEPI, UWBC, URBC, UBAC, CAST, UCOM, BILUA  MICROBIOLOGY: Recent Results (from the past 240 hour(s))  MRSA PCR Screening     Status: None   Collection Time: 02/25/15  2:05 AM  Result Value Ref Range Status   MRSA by PCR NEGATIVE NEGATIVE Final    Comment:        The GeneXpert MRSA Assay (FDA approved for NASAL specimens only), is one  component of a comprehensive MRSA colonization surveillance program. It is not intended to diagnose MRSA infection nor to guide or monitor treatment for MRSA infections.     RADIOLOGY STUDIES/RESULTS: Dg Chest 2 View  02/24/2015  CLINICAL DATA:  Chest pain with dizziness for 2 hours. Diabetes. Dialysis patient. EXAM: CHEST  2 VIEW COMPARISON:  01/21/2015 FINDINGS: Right central venous catheter with tip over the cavoatrial junction. No change in position since previous study. No pneumothorax. Mild  cardiac enlargement and pulmonary vascular congestion demonstrating mild progression since previous study. No focal airspace disease or consolidation. No edema. Blunting of the costophrenic angle on the right suggesting small pleural effusion. Mediastinal contours appear intact. Mild degenerative changes in the spine. IMPRESSION: Cardiac enlargement with mild vascular congestion and small right pleural effusion. Electronically Signed   By: Lucienne Capers M.D.   On: 02/24/2015 22:33    [facility] Pager:[phone]  If 7PM-7AM, please contact night-coverage [email or web address] Password [email or web address] 02/26/2015, 1:58 PM   LOS: 1 day

## 2015-02-26 NOTE — Progress Notes (Signed)
Patient Name: Dale [name] Date of Encounter: 02/26/2015  Principal Problem:   Chest pain Active Problems:   ESRD (end stage renal disease) on dialysis ([place]) M-W-F   HLD (hyperlipidemia)   Accelerated hypertension   DM (diabetes mellitus) type II controlled with renal manifestation [facility])   Primary Cardiologist: Dr [name]  Patient Profile: 49 yo with history of CAD (RCA stent 2015), HTN, ESRD, and chronic diastolic CHF admitted [id number] with HTN emergency (218/110) and cards saw for chest pain.  SUBJECTIVE: Had a rough night, woke up in a sweat, then was freezing later. N&V x 2 this am. Feels some better now. No chest pain.  OBJECTIVE Filed Vitals:   02/26/15 0635 02/26/15 0700 02/26/15 0757 02/26/15 0957  BP:  174/86 149/77 192/90  Pulse:      Temp:      TempSrc:      Resp: 16 16 15    Height:      Weight:      SpO2: 97% 99% 100%     Intake/Output Summary (Last 24 hours) at 02/26/15 1100 Last data filed at 02/25/15 2338  Gross per 24 hour  Intake    480 ml  Output   4000 ml  Net  -3520 ml   Filed Weights   02/25/15 0500 02/25/15 2008 02/25/15 2338  Weight: 284 lb 6.3 oz (129 kg) 281 lb 1.4 oz (127.5 kg) 272 lb 14.9 oz (123.8 kg)    PHYSICAL EXAM General: Well developed, well nourished, male in no acute distress. Head: Normocephalic, atraumatic.  Neck: Supple without bruits, JVD not elevated Lungs:  Resp regular and unlabored, CTA. Heart: RRR, S1, S2, no S3, S4, or murmur; no rub. Abdomen: Soft, non-tender, non-distended, BS + x 4.  Extremities: No clubbing, cyanosis, edema. S/p LLE BKA Neuro: Alert and oriented X 3. Moves all extremities spontaneously. Psych: Normal affect.  LABS: CBC: Recent Labs  02/24/15 2229  WBC 5.1  HGB 13.1  HCT 40.9  MCV 100.0  PLT 123XX123   Basic Metabolic Panel: Recent Labs  02/24/15 2229  NA 134*  K 5.1  CL 94*  CO2 27  GLUCOSE 271*  BUN 39*  CREATININE 8.44*  CALCIUM 9.8   Cardiac Enzymes: Recent Labs  02/25/15 0324 02/25/15 0520 02/25/15 0800  TROPONINI <0.03 <0.03 <0.03    Recent Labs  02/25/15 0049  TROPIPOC 0.01   BNP:  B NATRIURETIC PEPTIDE  Date/Time Value Ref Range Status  02/25/2015 12:03 AM 444.3* 0.0 - 100.0 pg/mL Final   Hemoglobin A1C: Recent Labs  02/25/15 0324  HGBA1C 6.6*   Fasting Lipid Panel: Recent Labs  02/25/15 1400  CHOL 143  HDL 29*  LDLCALC 63  TRIG 255*  CHOLHDL 4.9   TELE:  SR, PACs      Radiology/Studies: Dg Chest 2 View  02/24/2015  CLINICAL DATA:  Chest pain with dizziness for 2 hours. Diabetes. Dialysis patient. EXAM: CHEST  2 VIEW COMPARISON:  01/21/2015 FINDINGS: Right central venous catheter with tip over the cavoatrial junction. No change in position since previous study. No pneumothorax. Mild cardiac enlargement and pulmonary vascular congestion demonstrating mild progression since previous study. No focal airspace disease or consolidation. No edema. Blunting of the costophrenic angle on the right suggesting small pleural effusion. Mediastinal contours appear intact. Mild degenerative changes in the spine. IMPRESSION: Cardiac enlargement with mild vascular congestion and small right pleural effusion. Electronically Signed   By: Lucienne Capers M.D.   On: 02/24/2015 22:33  Current Medications:  . allopurinol  200 mg Oral Daily  . amLODipine  5 mg Oral Daily  . aspirin EC  325 mg Oral Daily  . atorvastatin  40 mg Oral QPM  . calcium acetate  1,334 mg Oral TID WC  . docusate sodium  200 mg Oral TID  . famotidine  20 mg Oral Daily  . furosemide  80 mg Oral BID  . heparin subcutaneous  5,000 Units Subcutaneous 3 times per day  . insulin pump   Subcutaneous 6 times per day  . levothyroxine  100 mcg Oral QAC breakfast  . loratadine  10 mg Oral Daily  . metoCLOPramide  5 mg Oral TID PC & HS  . metoprolol succinate  50 mg Oral Daily  . multivitamin  1 tablet Oral Daily  . prasugrel  10 mg Oral Daily  . rOPINIRole  2 mg Oral Q  supper  . sevelamer carbonate  1,600 mg Oral TID WC   . nitroGLYCERIN 25 mcg/min (02/26/15 1004)    ASSESSMENT AND PLAN: Principal Problem:   Chest pain - f/u on MV results - med rx for Diag 80% unless MV abnl  Otherwise, per IM. Active Problems:   ESRD (end stage renal disease) on dialysis ([place]) M-W-F   HLD (hyperlipidemia)   Accelerated hypertension   DM (diabetes mellitus) type II controlled with renal manifestation ([facility])   Signed, Barrett, Rhonda , PA-C 11:00 AM 02/26/2015  Personally seen and examined. Agree with above. NUC stress 2 day pending final day today HTN should improve with dialysis Should be able to wean off NTG.  Candee Furbish, MD

## 2015-02-26 NOTE — Progress Notes (Signed)
Patient changed Insulin Pump site to left lower abdomen.

## 2015-02-26 NOTE — Plan of Care (Signed)
Problem: Safety: Goal: Ability to remain free from injury will improve Outcome: Progressing Pt is a/o able to communicate needs and discomforts. Pt is aware that he must call for assist to stand or get out of bed.

## 2015-02-26 NOTE — Progress Notes (Signed)
  [facility] Progress Note   Subjective: has a HA, "good HD" last night, got 4kg off and is now just under dry wt. BP didn't budge  Filed Vitals:   02/26/15 0635 02/26/15 0700 02/26/15 0757 02/26/15 0957  BP:  174/86 149/77 192/90  Pulse:      Temp:      TempSrc:      Resp: 16 16 15    Height:      Weight:      SpO2: 97% 99% 100%    Exam: Alert, no distress, calm No jvd Chest is clear bilat RRR no MRG Abd obese soft ntnd no mass or ascites GU normal male Ext R leg in [employer] Neuro is alert, O x 3   MWF [facility] 4h 124kg 2/2.25 bath R IJ cath P2 Hep 3700 Fe 100 per HD Mircera 100 q2, last 10/26 Hect 2 ug  Assessment: 1. HTN'sive crisis - combination of vol excess/ OTC decongestants. A little better. Plan HD tomorrow and try to lower dry wt, prob still has extra volume on 2. ESRD on HD 3. Hx failed renal TX 4. Hx diast HF 5. IDDM type 1 6. CAD hx stents - stress test in progress  Plan - as above   [place] pager [phone]    cell [phone] 02/26/2015, 11:24 AM    Recent Labs Lab 02/24/15 2229  NA 134*  K 5.1  CL 94*  CO2 27  GLUCOSE 271*  BUN 39*  CREATININE 8.44*  CALCIUM 9.8   No results for input(s): AST, ALT, ALKPHOS, BILITOT, PROT, ALBUMIN in the last 168 hours.  Recent Labs Lab 02/24/15 2229  WBC 5.1  HGB 13.1  HCT 40.9  MCV 100.0  PLT 177   . allopurinol  200 mg Oral Daily  . amLODipine  5 mg Oral Daily  . aspirin EC  325 mg Oral Daily  . atorvastatin  40 mg Oral QPM  . calcium acetate  1,334 mg Oral TID WC  . docusate sodium  200 mg Oral TID  . famotidine  20 mg Oral Daily  . furosemide  80 mg Oral BID  . heparin subcutaneous  5,000 Units Subcutaneous 3 times per day  . insulin pump   Subcutaneous 6 times per day  . levothyroxine  100 mcg Oral QAC breakfast  . loratadine  10 mg Oral Daily  . metoCLOPramide  5 mg Oral TID PC & HS  . metoprolol succinate  50 mg Oral  Daily  . multivitamin  1 tablet Oral Daily  . prasugrel  10 mg Oral Daily  . rOPINIRole  2 mg Oral Q supper  . sevelamer carbonate  1,600 mg Oral TID WC   . nitroGLYCERIN 25 mcg/min (02/26/15 1004)   acetaminophen, diphenhydrAMINE, gi cocktail, glucagon (human recombinant), HYDROmorphone (DILAUDID) injection, nitroGLYCERIN, ondansetron (ZOFRAN) IV, zolpidem

## 2015-02-27 LAB — BASIC METABOLIC PANEL
Anion gap: 15 (ref 5–15)
BUN: 52 mg/dL — ABNORMAL HIGH (ref 6–20)
CO2: 25 mmol/L (ref 22–32)
Calcium: 9.1 mg/dL (ref 8.9–10.3)
Chloride: 95 mmol/L — ABNORMAL LOW (ref 101–111)
Creatinine, Ser: 8.35 mg/dL — ABNORMAL HIGH (ref 0.61–1.24)
GFR calc Af Amer: 8 mL/min — ABNORMAL LOW (ref >60)
GFR calc non Af Amer: 7 mL/min — ABNORMAL LOW (ref >60)
Glucose, Bld: 137 mg/dL — ABNORMAL HIGH (ref 65–99)
Potassium: 4.7 mmol/L (ref 3.5–5.1)
Sodium: 135 mmol/L (ref 135–145)

## 2015-02-27 LAB — GLUCOSE, CAPILLARY
Glucose-Capillary: 110 mg/dL — ABNORMAL HIGH (ref 65–99)
Glucose-Capillary: 121 mg/dL — ABNORMAL HIGH (ref 65–99)
Glucose-Capillary: 140 mg/dL — ABNORMAL HIGH (ref 65–99)
Glucose-Capillary: 161 mg/dL — ABNORMAL HIGH (ref 65–99)
Glucose-Capillary: 170 mg/dL — ABNORMAL HIGH (ref 65–99)
Glucose-Capillary: 171 mg/dL — ABNORMAL HIGH (ref 65–99)
Glucose-Capillary: 228 mg/dL — ABNORMAL HIGH (ref 65–99)

## 2015-02-27 MED ORDER — FENOFIBRATE 48 MG PO TABS: 48.0000 mg | ORAL_TABLET | Freq: Every day | ORAL | Status: DC

## 2015-02-27 MED ORDER — AMLODIPINE BESYLATE 10 MG PO TABS: 10.0000 mg | ORAL_TABLET | Freq: Every day | ORAL | Status: DC

## 2015-02-27 MED ORDER — METOPROLOL SUCCINATE ER 50 MG PO TB24
50.0000 mg | ORAL_TABLET | Freq: Every day | ORAL | Status: DC
Start: 2015-02-27 — End: 2015-08-08

## 2015-02-27 MED ORDER — METOPROLOL SUCCINATE ER 50 MG PO TB24: 50.0000 mg | ORAL_TABLET | Freq: Every day | ORAL | Status: DC

## 2015-02-27 MED ORDER — AMLODIPINE BESYLATE 10 MG PO TABS
10.0000 mg | ORAL_TABLET | Freq: Every day | ORAL | Status: DC
  Administered 2015-02-27: 10 mg via ORAL
  Filled 2015-02-27: qty 1

## 2015-02-27 MED ORDER — HYDROMORPHONE HCL 1 MG/ML IJ SOLN
INTRAMUSCULAR | Status: AC
  Filled 2015-02-27: qty 2

## 2015-02-27 MED ORDER — ASPIRIN EC 81 MG PO TBEC
81.0000 mg | DELAYED_RELEASE_TABLET | Freq: Every day | ORAL | Status: DC
  Administered 2015-02-27: 81 mg via ORAL
  Filled 2015-02-27: qty 1

## 2015-02-27 NOTE — Progress Notes (Signed)
Subjective:  On hd no cos   Objective Vital signs in last 24 hours: Filed Vitals:   02/27/15 0956 02/27/15 1030 02/27/15 1100 02/27/15 1130  BP: 131/67 141/71 115/70 157/67  Pulse: 85 85 83 79  Temp:      TempSrc:      Resp:  14    Height:      Weight:      SpO2:       Weight change:   Physical Exam: General: Alert NAD in hd  Heart: RRR, no mur, rub or gallop Lungs: CTa bilat  Abdomen: obese, soft NT, ND Extremities: R lower leg UNA boot / L bka no edema  Dialysis Access: R IJ p.cath patent on HD    OP HD= MWF [place] [facility] 4h 124kg 2/2.25 bath R IJ cath P2 Hep 3700 Fe 100 per HD Mircera 100 q2, last 10/26 Hect 2 ug  Problem/Plan: 1.  Volume overload- lower edw at dc /tolerating 5.5 goal so far today with 4300uf so far on hd fu stand wt ( has prosthesis to use  In HD) fu  at op hd unit / to see Dr. Mercy Moore next week in op hd  2. HTN Crisis sec to vol ^ plus OTC deconges 3. CP / CAD  With no ischemia on stress / card following and med rx  4. ESRD - on schedule MWF/ hx failed kid tx 5. Anemia - hgb 13.1 fu hgb as op  For esa  6. Secondary hyperparathyroidism - vit and binder 7. IDDM type 1-    Ernest Haber, PA-C [place] 02/27/2015,11:38 AM  LOS: 2 days   Pt seen, examined and agree w A/P as above.  Kelly Splinter MD [facility] pager [phone]    cell [phone] 02/27/2015, 12:19 PM    Labs: Basic Metabolic Panel:  Recent Labs Lab 02/24/15 2229 02/27/15 0844  NA 134* 135  K 5.1 4.7  CL 94* 95*  CO2 27 25  GLUCOSE 271* 137*  BUN 39* 52*  CREATININE 8.44* 8.35*  CALCIUM 9.8 9.1   Liver Function Tests: No results for input(s): AST, ALT, ALKPHOS, BILITOT, PROT, ALBUMIN in the last 168 hours. No results for input(s): LIPASE, AMYLASE in the last 168 hours. No results for input(s): AMMONIA in the last 168 hours. CBC:  Recent Labs Lab 02/24/15 2229  WBC 5.1  HGB 13.1  HCT 40.9  MCV 100.0   PLT 177   Cardiac Enzymes:  Recent Labs Lab 02/25/15 0324 02/25/15 0520 02/25/15 0800  TROPONINI <0.03 <0.03 <0.03   CBG:  Recent Labs Lab 02/26/15 2006 02/27/15 0010 02/27/15 0413 02/27/15 0728 02/27/15 1000  GLUCAP 125* 171* 228* 170* 121*    Studies/Results: Nm Myocar Multi W/spect W/wall Motion / Ef  02/26/2015   There was no ST segment deviation noted during stress.  Defect 1: There is a small defect of mild severity present in the mid anterior location.  The study is normal.  This is a low risk study.  The left ventricular ejection fraction is mildly decreased (45-54%).    Medications: . nitroGLYCERIN 5 mcg/min (02/26/15 1145)   . allopurinol  200 mg Oral Daily  . amLODipine  10 mg Oral Daily  . aspirin EC  81 mg Oral Daily  . atorvastatin  40 mg Oral QPM  . calcium acetate  1,334 mg Oral TID WC  . docusate sodium  200 mg Oral TID  . famotidine  20 mg Oral Daily  .  furosemide  80 mg Oral BID  . heparin subcutaneous  5,000 Units Subcutaneous 3 times per day  . HYDROmorphone      . insulin pump   Subcutaneous 6 times per day  . levothyroxine  100 mcg Oral QAC breakfast  . loratadine  10 mg Oral Daily  . metoCLOPramide  5 mg Oral TID PC & HS  . metoprolol succinate  50 mg Oral Daily  . multivitamin  1 tablet Oral Daily  . prasugrel  10 mg Oral Daily  . rOPINIRole  2 mg Oral Q supper  . sevelamer carbonate  1,600 mg Oral TID WC

## 2015-02-27 NOTE — Progress Notes (Signed)
Patient Name: Dale [name] Date of Encounter: 02/27/2015    Principal Problem:   Chest pain Active Problems:   ESRD (end stage renal disease) on dialysis ([place]) M-W-F   HLD (hyperlipidemia)   Accelerated hypertension   DM (diabetes mellitus) type II controlled with renal manifestation ([place])   Chest pain at rest   Primary Cardiologist: Dr [name]  Patient Profile: 49 yo with history of CAD (RCA stent 2015), HTN, ESRD, and chronic diastolic CHF admitted [id number] with HTN emergency (218/110) and cards saw for chest pain.  SUBJECTIVE: Currently in HD. No CP.   OBJECTIVE Filed Vitals:   02/27/15 0924 02/27/15 0956 02/27/15 1030 02/27/15 1100  BP: 131/67 131/67 141/71 115/70  Pulse: 87 85 85 83  Temp:      TempSrc:      Resp:   14   Height:      Weight:      SpO2:        Intake/Output Summary (Last 24 hours) at 02/27/15 1122 Last data filed at 02/27/15 0600  Gross per 24 hour  Intake    240 ml  Output      0 ml  Net    240 ml   Filed Weights   02/25/15 2008 02/25/15 2338 02/27/15 0825  Weight: 281 lb 1.4 oz (127.5 kg) 272 lb 14.9 oz (123.8 kg) 279 lb 12.2 oz (126.9 kg)    PHYSICAL EXAM General: Well developed, well nourished, male in no acute distress. Head: Normocephalic, atraumatic.  Neck: Supple without bruits, JVD not elevated Lungs:  Resp regular and unlabored, CTA. Heart: RRR, S1, S2, no S3, S4, or murmur; no rub. Abdomen: Soft, non-tender, non-distended, BS + x 4. Obese Extremities: No clubbing, cyanosis, edema. S/p LLE BKA Neuro: Alert and oriented X 3. Moves all extremities spontaneously. Psych: Normal affect.  LABS: CBC:  Recent Labs  02/24/15 2229  WBC 5.1  HGB 13.1  HCT 40.9  MCV 100.0  PLT [id number]   Basic Metabolic Panel:  Recent Labs  02/24/15 2229 02/27/15 0844  NA 134* 135  K 5.1 4.7  CL 94* 95*  CO2 27 25  GLUCOSE 271* 137*  BUN 39* 52*  CREATININE 8.44* 8.35*  CALCIUM 9.8 9.1   Cardiac Enzymes:  Recent Labs   02/25/15 0324 02/25/15 0520 02/25/15 0800  TROPONINI <0.03 <0.03 <0.03    Recent Labs  02/25/15 0049  TROPIPOC 0.01   BNP:  B NATRIURETIC PEPTIDE  Date/Time Value Ref Range Status  02/25/2015 12:03 AM 444.3* 0.0 - 100.0 pg/mL Final   Hemoglobin A1C:  Recent Labs  02/25/15 0324  HGBA1C 6.6*   Fasting Lipid Panel:  Recent Labs  02/25/15 1400  CHOL 143  HDL 29*  LDLCALC 63  TRIG 255*  CHOLHDL 4.9   TELE:  SR, PACs      Radiology/Studies: Nm Myocar Multi W/spect W/wall Motion / Ef  02/26/2015   There was no ST segment deviation noted during stress.  Defect 1: There is a small defect of mild severity present in the mid anterior location.  The study is normal.  This is a low risk study.  The left ventricular ejection fraction is mildly decreased (45-54%).      Current Medications:  . allopurinol  200 mg Oral Daily  . amLODipine  10 mg Oral Daily  . aspirin EC  81 mg Oral Daily  . atorvastatin  40 mg Oral QPM  . calcium acetate  1,334 mg Oral  TID WC  . docusate sodium  200 mg Oral TID  . famotidine  20 mg Oral Daily  . furosemide  80 mg Oral BID  . heparin subcutaneous  5,000 Units Subcutaneous 3 times per day  . HYDROmorphone      . insulin pump   Subcutaneous 6 times per day  . levothyroxine  100 mcg Oral QAC breakfast  . loratadine  10 mg Oral Daily  . metoCLOPramide  5 mg Oral TID PC & HS  . metoprolol succinate  50 mg Oral Daily  . multivitamin  1 tablet Oral Daily  . prasugrel  10 mg Oral Daily  . rOPINIRole  2 mg Oral Q supper  . sevelamer carbonate  1,600 mg Oral TID WC   . nitroGLYCERIN 5 mcg/min (02/26/15 1145)    ASSESSMENT AND PLAN: Principal Problem:   Chest pain - reassuring NUC stress test, no ischemia.  - med rx for Diag 80% previously seen on Cath -Trop normal.   Otherwise, per IM. Active Problems:   ESRD (end stage renal disease) on dialysis ([place]) M-W-F   HLD (hyperlipidemia)   Accelerated hypertension   DM (diabetes  mellitus) type II controlled with renal manifestation ([place])  F/U with cardiology as previously scheduled.   Bobby Rumpf , MD 11:22 AM 02/27/2015

## 2015-02-27 NOTE — Discharge Summary (Signed)
PATIENT DETAILS Name: Dale [name] Age: 49 y.o. Sex: male Date of Birth: Oct 16, 1965 MRN: [id number]. Admitting Physician: Allyne Gee, MD CN:[id number] T, MD  Admit Date: 02/24/2015 Discharge date: 02/27/2015  Recommendations for Outpatient Follow-up:  1. Follow-up with PCP Dr. Mercy Moore in 2 weeks for hospital follow-up  2. Please repeat CBC/CMET at next visit   PRIMARY DISCHARGE DIAGNOSIS:  Principal Problem:   Chest pain Active Problems:   ESRD (end stage renal disease) on dialysis ([place]) M-W-F   HLD (hyperlipidemia)   Accelerated hypertension   DM (diabetes mellitus) type II controlled with renal manifestation (HCC)   Chest pain at rest      PAST MEDICAL HISTORY: Past Medical History  Diagnosis Date  . Hypertension   . Anemia of chronic disease   . Cholecystitis     a. 08/27/2011  . Constipation   . Pericardial effusion     a.  Small by CT 08/22/11;  b.  Large by CT 08/27/11  . Inguinal lymphadenopathy     a. bilateral - s/p biopsy 07/2011  . Fibromyalgia   . Chronic diastolic CHF (congestive heart failure) ([place])     a. Echo 3/13:  EF 55-60%;  b. Echo (11/15):  Mild LVH, EF 60-65%, no RWMA, Gr 1 DD, MAC, mild LAE, normal RVF, mild RAE;  c.  Echo 5/16:  severe LVH, EF 55-60%, no RWMA, Gr 1 DD, MAC, mild LAE  . HLD (hyperlipidemia)   . Hypothyroidism   . GERD (gastroesophageal reflux disease)   . CAD (coronary artery disease)     a. Lexiscan Myoview (11/15):  Inf, apical cap and apical lateral ischemia, EF 56%, inf HK, Moderate Risk;  b. LHC (11/15):  mid to dist Dx 80%, mid RCA 99% (functional CTO), dist RCA 80% >> PCI: balloon angioplasty to mid RCA (could not deliver stent) - plan staged PCI with HSRA of RCA; Dx to be tx medically >> PCI: Rotoblator atherectomy/DES to [facility]  . OSA (obstructive sleep apnea)     adjustable bed  . Shortness of breath dyspnea     too much Fluid  . Pneumonia ~ 2007  . Insulin dependent diabetes mellitus (HCC)     Type I  .  Claustrophobia     when things get around his face.   [name] Kitchen ESRD (end stage renal disease) ([place])     a. 1995 s/p cadaveric transplant w/ susbequent failure after 18 yrs;  b.Dialysis initiated 07/2011 [facility] M-W-F  . Constipation   . Arthritis     "hands, right knee" (03/19/2014)  . History of blood transfusion     DISCHARGE MEDICATIONS: Current Discharge Medication List    START taking these medications   Details  amLODipine (NORVASC) 10 MG tablet Take 1 tablet (10 mg total) by mouth daily. Qty: 30 tablet, Refills: 0    fenofibrate (TRICOR) 48 MG tablet Take 1 tablet (48 mg total) by mouth daily. Qty: 30 tablet, Refills: 0      CONTINUE these medications which have CHANGED   Details  metoprolol succinate (TOPROL-XL) 50 MG 24 hr tablet Take 1 tablet (50 mg total) by mouth daily. Take with or immediately following a meal. Qty: 30 tablet, Refills: 0      CONTINUE these medications which have NOT CHANGED   Details  allopurinol (ZYLOPRIM) 100 MG tablet Take 200 mg by mouth daily.     aspirin 81 MG tablet Take 81 mg by mouth every evening.  atorvastatin (LIPITOR) 10 MG tablet Take 40 mg by mouth every evening.     Beclomethasone Dipropionate (QNASL) 80 MCG/ACT AERS Place 2 sprays into both nostrils 2 (two) times daily.     calcium acetate (PHOSLO) 667 MG capsule Take 667-1,334 mg by mouth See admin instructions. Take 1,334 mg (2 capsules) with meals and 667 (1 capsule) with snacks    diphenhydrAMINE (BENADRYL) 25 mg capsule Take 1-2 capsules (25-50 mg total) by mouth every 6 (six) hours as needed for itching. Qty: 30 capsule, Refills: 0    docusate sodium (COLACE) 100 MG capsule Take 200 mg by mouth 3 (three) times daily.     EFFIENT 10 MG TABS tablet TAKE 1 TABLET BY MOUTH EVERY DAY Qty: 30 tablet, Refills: 0    furosemide (LASIX) 80 MG tablet Take 80 mg by mouth 2 (two) times daily.  Refills: 98    GLUCAGON EMERGENCY 1 MG injection Inject 1 mg into the muscle daily as  needed (low blood sugar).     hydrocortisone 2.5 % cream APPLY 2-3 TIMES DAILY AS NEEDED TO AFFECTED AREA(S). Refills: 1    HYDROmorphone (DILAUDID) 2 MG tablet Take 1 tablet (2 mg total) by mouth 3 (three) times daily as needed for severe pain. Qty: 20 tablet, Refills: 0    Insulin Human (INSULIN PUMP) 100 unit/ml SOLN Inject 72 each into the skin continuous. Humalog    levothyroxine (SYNTHROID, LEVOTHROID) 100 MCG tablet Take 100 mcg by mouth at bedtime.     loratadine (CLARITIN) 10 MG tablet Take 10 mg by mouth daily.    metoCLOPramide (REGLAN) 5 MG tablet Take 5 mg by mouth 4 (four) times daily - after meals and at bedtime.    multivitamin (RENA-VIT) TABS tablet Take 1 tablet by mouth daily.    nitroGLYCERIN (NITROSTAT) 0.4 MG SL tablet Place 0.4 mg under the tongue every 5 (five) minutes as needed for chest pain.     promethazine (PHENERGAN) 25 MG tablet Take 1 tablet (25 mg total) by mouth every 6 (six) hours as needed for nausea or vomiting. For nausea Qty: 30 tablet, Refills: 0    ranitidine (ZANTAC) 150 MG tablet Take 150 mg by mouth 2 (two) times daily. Refills: 4    rOPINIRole (REQUIP) 2 MG tablet Take 2 mg by mouth daily with supper.     sevelamer carbonate (RENVELA) 800 MG tablet Take 1,600 mg by mouth 3 (three) times daily with meals.        ALLERGIES:   Allergies  Allergen Reactions  . Contrast Media [Iodinated Diagnostic Agents] Rash  . Morphine And Related Hives and Nausea Only  . Amoxicillin Other (See Comments)    Reaction unknown  . Ciprofloxacin Other (See Comments)    Reaction unknown  . Clindamycin/Lincomycin Other (See Comments)    Reaction unknown  . Codeine Other (See Comments)    Reaction unknown. But pt has tolerated hydrocodone in Vicodin and Norco in the past  . Doxycycline Rash  . Hydrocodone Rash  . Levofloxacin Other (See Comments)    Reaction unknown  . Oxycodone Rash  . Plavix [Clopidogrel Bisulfate] Rash  . Rocephin  [Ceftriaxone] Itching  . Vasotec Cough    BRIEF HPI:  See H&P, Labs, Consult and Test reports for all details in brief, patient is a 49 y/o male with hx of HTN, failed renal transplant, and ESRD on HD who presented to the ED on 11/1 with complaints of chest pain. He was found to be severely hypertensive  and was admitted for further work-up.   CONSULTATIONS:   cardiology and nephrology  PERTINENT RADIOLOGIC STUDIES: Dg Chest 2 View  02/24/2015  CLINICAL DATA:  Chest pain with dizziness for 2 hours. Diabetes. Dialysis patient. EXAM: CHEST  2 VIEW COMPARISON:  01/21/2015 FINDINGS: Right central venous catheter with tip over the cavoatrial junction. No change in position since previous study. No pneumothorax. Mild cardiac enlargement and pulmonary vascular congestion demonstrating mild progression since previous study. No focal airspace disease or consolidation. No edema. Blunting of the costophrenic angle on the right suggesting small pleural effusion. Mediastinal contours appear intact. Mild degenerative changes in the spine. IMPRESSION: Cardiac enlargement with mild vascular congestion and small right pleural effusion. Electronically Signed   By: Lucienne Capers M.D.   On: 02/24/2015 22:33   Nm Myocar Multi W/spect W/wall Motion / Ef  02/26/2015   There was no ST segment deviation noted during stress.  Defect 1: There is a small defect of mild severity present in the mid anterior location.  The study is normal.  This is a low risk study.  The left ventricular ejection fraction is mildly decreased (45-54%).      PERTINENT LAB RESULTS: CBC:  Recent Labs  02/24/15 2229  WBC 5.1  HGB 13.1  HCT 40.9  PLT 177   CMET CMP     Component Value Date/Time   NA 135 02/27/2015 0844   K 4.7 02/27/2015 0844   CL 95* 02/27/2015 0844   CO2 25 02/27/2015 0844   GLUCOSE 137* 02/27/2015 0844   BUN 52* 02/27/2015 0844   CREATININE 8.35* 02/27/2015 0844   CALCIUM 9.1 02/27/2015 0844   PROT  7.1 01/19/2015 0458   ALBUMIN 3.2* 01/19/2015 0458   AST 16 01/19/2015 0458   ALT 18 01/19/2015 0458   ALKPHOS 156* 01/19/2015 0458   BILITOT 0.6 01/19/2015 0458   GFRNONAA 7* 02/27/2015 0844   GFRAA 8* 02/27/2015 0844    GFR Estimated Creatinine Clearance: 13.9 mL/min (by C-G formula based on Cr of 8.35). No results for input(s): LIPASE, AMYLASE in the last 72 hours.  Recent Labs  02/25/15 0324 02/25/15 0520 02/25/15 0800  TROPONINI <0.03 <0.03 <0.03   Invalid input(s): POCBNP No results for input(s): DDIMER in the last 72 hours.  Recent Labs  02/25/15 0324  HGBA1C 6.6*    Recent Labs  02/25/15 1400  CHOL 143  HDL 29*  LDLCALC 63  TRIG 255*  CHOLHDL 4.9   No results for input(s): TSH, T4TOTAL, T3FREE, THYROIDAB in the last 72 hours.  Invalid input(s): FREET3 No results for input(s): VITAMINB12, FOLATE, FERRITIN, TIBC, IRON, RETICCTPCT in the last 72 hours. Coags: No results for input(s): INR in the last 72 hours.  Invalid input(s): PT Microbiology: Recent Results (from the past 240 hour(s))  MRSA PCR Screening     Status: None   Collection Time: 02/25/15  2:05 AM  Result Value Ref Range Status   MRSA by PCR NEGATIVE NEGATIVE Final    Comment:        The GeneXpert MRSA Assay (FDA approved for NASAL specimens only), is one component of a comprehensive MRSA colonization surveillance program. It is not intended to diagnose MRSA infection nor to guide or monitor treatment for MRSA infections.      BRIEF HOSPITAL COURSE:  1. Chest Pain - Likely due to hypertensive emergency. PCI performed 02/2014 with PCI of RCA and diagonal 80% occluded, managed medically. Cardiology was consulted - cyclic troponins negative, placed patient on  NTG gtt. Echo performed 02/25/15 with EF 60-65%, moderate LVH and grade 2 diastolic dysfunction. Lexiscan myoview low risk without ST changes. Patient's BP medication regimen was adjusted and he is discharged today in stable  condition and will follow-up with his PCP.  mproving during admission,  2. Hypertensive Emergency - See cardiac work-up above. Has remained hypertensive throughout admission but is asymptomatic. Optimized BP medication regiment - will be discharged on increased dose of Metoprolol, Amlodipine and Lasix.   3. ESRD on Dialysis - HD MWF, regular session this AM, is continuing to make urine.   4. DM Type II - Hgb A1c 02/25/15 - 6.6. Continue insulin pump   5. Hyperlipidemia - LDL 02/25/15 - 63, triglycerides - 255. Added Tricor, continue Lipitor.   6. Hypothyroidism - Continue Synthroid   TODAY-DAY OF DISCHARGE:  Subjective:   [name] denies chest pain, SOB   Objective:   Blood pressure 146/70, pulse 78, temperature 98.1 F (36.7 C), temperature source Oral, resp. rate 14, height 5\' 8"  (1.727 m), weight 126.9 kg (279 lb 12.2 oz), SpO2 100 %.  Intake/Output Summary (Last 24 hours) at 02/27/15 1214 Last data filed at 02/27/15 0600  Gross per 24 hour  Intake    240 ml  Output      0 ml  Net    240 ml   Filed Weights   02/25/15 2008 02/25/15 2338 02/27/15 0825  Weight: 127.5 kg (281 lb 1.4 oz) 123.8 kg (272 lb 14.9 oz) 126.9 kg (279 lb 12.2 oz)    Exam Awake Alert, Oriented *3, No new F.N deficits, Normal affect Oval.AT,PERRAL Supple Neck,No JVD, No cervical lymphadenopathy appriciated.  Symmetrical Chest wall movement, Good air movement bilaterally, CTAB RRR,No Gallops,Rubs or new Murmurs, No Parasternal Heave +ve B.Sounds, Abd Soft, Non tender, No organomegaly appriciated, No rebound -guarding or rigidity. No Cyanosis, Clubbing or edema, No new Rash or bruise  DISCHARGE CONDITION: Stable  DISPOSITION: Home  DISCHARGE INSTRUCTIONS:    Activity:  As tolerated with Full fall precautions use walker/cane & assistance as needed  Get Medicines reviewed and adjusted: Please take all your medications with you for your next visit with your Primary MD  Please request  your Primary MD to go over all hospital tests and procedure/radiological results at the follow up, please ask your Primary MD to get all Hospital records sent to his/her office.  If you experience worsening of your admission symptoms, develop shortness of breath, life threatening emergency, suicidal or homicidal thoughts you must seek medical attention immediately by calling 911 or calling your MD immediately  if symptoms less severe.  You must read complete instructions/literature along with all the possible adverse reactions/side effects for all the Medicines you take and that have been prescribed to you. Take any new Medicines after you have completely understood and accpet all the possible adverse reactions/side effects.   Do not drive when taking Pain medications.   Do not take more than prescribed Pain, Sleep and Anxiety Medications  Special Instructions: If you have smoked or chewed Tobacco  in the last 2 yrs please stop smoking, stop any regular Alcohol  and or any Recreational drug use.  Wear Seat belts while driving.  Please note  You were cared for by a hospitalist during your hospital stay. Once you are discharged, your primary care physician will handle any further medical issues. Please note that NO REFILLS for any discharge medications will be authorized once you are discharged, as it is imperative that  you return to your primary care physician (or establish a relationship with a primary care physician if you do not have one) for your aftercare needs so that they can reassess your need for medications and monitor your lab values.   Diet recommendation: Diabetic Diet Heart Healthy diet Aspiration precautions: no   Discharge Instructions    (HEART FAILURE PATIENTS) Call MD:  Anytime you have any of the following symptoms: 1) 3 pound weight gain in 24 hours or 5 pounds in 1 week 2) shortness of breath, with or without a dry hacking cough 3) swelling in the hands, feet or stomach  4) if you have to sleep on extra pillows at night in order to breathe.    Complete by:  As directed      Call MD for:  difficulty breathing, headache or visual disturbances    Complete by:  As directed      Call MD for:  persistant dizziness or light-headedness    Complete by:  As directed      Diet - low sodium heart healthy    Complete by:  As directed      Diet Carb Modified    Complete by:  As directed      Discharge instructions    Complete by:  As directed   Please try to eat several small meals throughout the day rather than 3 large meals to prevent nausea and vomiting.     Increase activity slowly    Complete by:  As directed            Follow-up Information    Follow up with MATTINGLY,MICHAEL T, MD. Schedule an appointment as soon as possible for a visit in 2 weeks.   Specialty:  Nephrology   Why:  For hospital follow-up   Contact information:   [place] [address] [phone]       Follow up with Richardson Dopp, PA-C On 04/06/2015.   Specialties:  Physician Assistant, Radiology, Interventional Cardiology   Why:  appt at 3:40 pm-, Hospital follow up   Contact information:   [id number] [place] [address] [phone]       Please follow up.   Why:  keep your usual schedule   Contact information:   Hemodialysis center     Total Time spent on discharge equals  45 minutes.  SignedOren Binet 02/27/2015 12:14 PM

## 2015-02-28 LAB — VITAMIN C: Vitamin C: 0.4 mg/dL (ref 0.2–2.0)

## 2015-03-01 ENCOUNTER — Telehealth: Payer: Self-pay | Admitting: Cardiology

## 2015-03-01 ENCOUNTER — Emergency Department (HOSPITAL_COMMUNITY): Payer: Medicare Other

## 2015-03-01 ENCOUNTER — Encounter (HOSPITAL_COMMUNITY): Payer: Self-pay | Admitting: Emergency Medicine

## 2015-03-01 ENCOUNTER — Emergency Department (HOSPITAL_COMMUNITY)
Admission: EM | Admit: 2015-03-01 | Discharge: 2015-03-01 | Disposition: A | Discharge status: Home / Self Care | Payer: Medicare Other | Attending: Emergency Medicine | Admitting: Emergency Medicine

## 2015-03-01 DIAGNOSIS — E785 Hyperlipidemia, unspecified: Secondary | ICD-10-CM | POA: Diagnosis not present

## 2015-03-01 DIAGNOSIS — M19041 Primary osteoarthritis, right hand: Secondary | ICD-10-CM | POA: Diagnosis not present

## 2015-03-01 DIAGNOSIS — K219 Gastro-esophageal reflux disease without esophagitis: Secondary | ICD-10-CM | POA: Diagnosis not present

## 2015-03-01 DIAGNOSIS — Z9861 Coronary angioplasty status: Secondary | ICD-10-CM | POA: Diagnosis not present

## 2015-03-01 DIAGNOSIS — Z7951 Long term (current) use of inhaled steroids: Secondary | ICD-10-CM | POA: Insufficient documentation

## 2015-03-01 DIAGNOSIS — I10 Essential (primary) hypertension: Secondary | ICD-10-CM

## 2015-03-01 DIAGNOSIS — M1711 Unilateral primary osteoarthritis, right knee: Secondary | ICD-10-CM | POA: Diagnosis not present

## 2015-03-01 DIAGNOSIS — Z9889 Other specified postprocedural states: Secondary | ICD-10-CM | POA: Diagnosis not present

## 2015-03-01 DIAGNOSIS — Z8669 Personal history of other diseases of the nervous system and sense organs: Secondary | ICD-10-CM | POA: Insufficient documentation

## 2015-03-01 DIAGNOSIS — Z880 Allergy status to penicillin: Secondary | ICD-10-CM | POA: Diagnosis not present

## 2015-03-01 DIAGNOSIS — Z8701 Personal history of pneumonia (recurrent): Secondary | ICD-10-CM | POA: Insufficient documentation

## 2015-03-01 DIAGNOSIS — M19042 Primary osteoarthritis, left hand: Secondary | ICD-10-CM | POA: Diagnosis not present

## 2015-03-01 DIAGNOSIS — F419 Anxiety disorder, unspecified: Secondary | ICD-10-CM | POA: Diagnosis not present

## 2015-03-01 DIAGNOSIS — Z79899 Other long term (current) drug therapy: Secondary | ICD-10-CM | POA: Diagnosis not present

## 2015-03-01 DIAGNOSIS — Z862 Personal history of diseases of the blood and blood-forming organs and certain disorders involving the immune mechanism: Secondary | ICD-10-CM | POA: Insufficient documentation

## 2015-03-01 DIAGNOSIS — Z794 Long term (current) use of insulin: Secondary | ICD-10-CM | POA: Diagnosis not present

## 2015-03-01 DIAGNOSIS — Z7952 Long term (current) use of systemic steroids: Secondary | ICD-10-CM | POA: Insufficient documentation

## 2015-03-01 DIAGNOSIS — I2510 Atherosclerotic heart disease of native coronary artery without angina pectoris: Secondary | ICD-10-CM | POA: Insufficient documentation

## 2015-03-01 DIAGNOSIS — R0789 Other chest pain: Secondary | ICD-10-CM | POA: Diagnosis not present

## 2015-03-01 DIAGNOSIS — E039 Hypothyroidism, unspecified: Secondary | ICD-10-CM | POA: Diagnosis not present

## 2015-03-01 DIAGNOSIS — I5032 Chronic diastolic (congestive) heart failure: Secondary | ICD-10-CM | POA: Diagnosis not present

## 2015-03-01 DIAGNOSIS — Z992 Dependence on renal dialysis: Secondary | ICD-10-CM | POA: Diagnosis not present

## 2015-03-01 DIAGNOSIS — K5900 Constipation, unspecified: Secondary | ICD-10-CM | POA: Diagnosis not present

## 2015-03-01 DIAGNOSIS — I120 Hypertensive chronic kidney disease with stage 5 chronic kidney disease or end stage renal disease: Secondary | ICD-10-CM | POA: Insufficient documentation

## 2015-03-01 DIAGNOSIS — E119 Type 2 diabetes mellitus without complications: Secondary | ICD-10-CM | POA: Insufficient documentation

## 2015-03-01 DIAGNOSIS — N186 End stage renal disease: Secondary | ICD-10-CM | POA: Insufficient documentation

## 2015-03-01 DIAGNOSIS — R079 Chest pain, unspecified: Secondary | ICD-10-CM | POA: Diagnosis present

## 2015-03-01 DIAGNOSIS — R51 Headache: Secondary | ICD-10-CM | POA: Diagnosis not present

## 2015-03-01 LAB — CBC
HCT: 37.3 % — ABNORMAL LOW (ref 39.0–52.0)
Hemoglobin: 12.1 g/dL — ABNORMAL LOW (ref 13.0–17.0)
MCH: 31.8 pg (ref 26.0–34.0)
MCHC: 32.4 g/dL (ref 30.0–36.0)
MCV: 98.2 fL (ref 78.0–100.0)
Platelets: 198 10*3/uL (ref 150–400)
RBC: 3.8 MIL/uL — ABNORMAL LOW (ref 4.22–5.81)
RDW: 15.7 % — ABNORMAL HIGH (ref 11.5–15.5)
WBC: 9.1 10*3/uL (ref 4.0–10.5)

## 2015-03-01 LAB — BASIC METABOLIC PANEL
Anion gap: 15 (ref 5–15)
BUN: 87 mg/dL — ABNORMAL HIGH (ref 6–20)
CO2: 21 mmol/L — ABNORMAL LOW (ref 22–32)
Calcium: 9.3 mg/dL (ref 8.9–10.3)
Chloride: 99 mmol/L — ABNORMAL LOW (ref 101–111)
Creatinine, Ser: 9.57 mg/dL — ABNORMAL HIGH (ref 0.61–1.24)
GFR calc Af Amer: 7 mL/min — ABNORMAL LOW (ref >60)
GFR calc non Af Amer: 6 mL/min — ABNORMAL LOW (ref >60)
Glucose, Bld: 294 mg/dL — ABNORMAL HIGH (ref 65–99)
Potassium: 5.2 mmol/L — ABNORMAL HIGH (ref 3.5–5.1)
Sodium: 135 mmol/L (ref 135–145)

## 2015-03-01 LAB — I-STAT TROPONIN, ED
Troponin i, poc: 0.01 ng/mL (ref 0.00–0.08)
Troponin i, poc: 0.01 ng/mL (ref 0.00–0.08)

## 2015-03-01 MED ORDER — ACETAMINOPHEN 325 MG PO TABS
650.0000 mg | ORAL_TABLET | Freq: Once | ORAL | Status: AC
  Administered 2015-03-01: 650 mg via ORAL
  Filled 2015-03-01: qty 2

## 2015-03-01 MED ORDER — TRAMADOL HCL 50 MG PO TABS
50.0000 mg | ORAL_TABLET | Freq: Once | ORAL | Status: AC
  Administered 2015-03-01: 50 mg via ORAL
  Filled 2015-03-01: qty 1

## 2015-03-01 MED ORDER — FAMOTIDINE 20 MG PO TABS
20.0000 mg | ORAL_TABLET | Freq: Once | ORAL | Status: AC
Start: 2015-03-01 — End: 2015-03-01
  Administered 2015-03-01: 20 mg via ORAL
  Filled 2015-03-01: qty 1

## 2015-03-01 MED ORDER — GI COCKTAIL ~~LOC~~
30.0000 mL | Freq: Once | ORAL | Status: AC
  Administered 2015-03-01: 30 mL via ORAL
  Filled 2015-03-01: qty 30

## 2015-03-01 NOTE — Telephone Encounter (Signed)
Patient called complaining that his BP is too high.  He was recently discharged last week with chest pain and HTN.  He had a low risk myoview and normal LVF by echo.  BP today is 210/141mmHg.  He took his Toprol this am but has not taken his amlodipine.  He is not having pressure in his chest and severe HA.  Instructed to take another 25mg  of Toprol and go to [facility] for further evaulation.  Patient agreed to plan

## 2015-03-01 NOTE — ED Notes (Signed)
C/o headache, pressure in center of chest, sob, and dizziness since around 6pm.  States he was recently admitted for same and discharged on Friday.

## 2015-03-01 NOTE — ED Provider Notes (Signed)
CSN: [id number]     Arrival date & time 03/01/15  1907 History   First MD Initiated Contact with Patient 03/01/15 1922     Chief Complaint  Patient presents with  . Chest Pain  . Headache     (Consider location/radiation/quality/duration/timing/severity/associated sxs/prior Treatment) Patient is a 49 y.o. male presenting with chest pain and headaches. The history is provided by the patient.  Chest Pain Associated symptoms: headache   Associated symptoms: no abdominal pain, no back pain, no cough, no fever, no numbness, no palpitations, no shortness of breath, not vomiting and no weakness   Headache Associated symptoms: no abdominal pain, no back pain, no cough, no diarrhea, no fever, no neck pain, no numbness, no sore throat, no vomiting and no weakness   Patient w hx esrd on hd m/w/f, cad, presents c/o concern re bp high, and also mid chest pain onset this evening at rest. Pain dull, non radiating. ?also w mild reflux symptoms. No sob, nv or diaphoresis.  Pt states initially was concerned that bp was high tonight, called his cardiologist on call, and was advised to take 1/2 a metoprolol.  Pt then noted dull chest discomfort. Denies any exertional cp, or unusual doe. Had normal hd 2 days ago. Notes admission in past week for same symptoms, and that time troponins and stress test neg. No pleuritic pain. Denies cough or uri c/o. No fever or chills. Pt also c/o dull, frontal headache, gradual onset, mild - states has not taken anything for as of yet.       Past Medical History  Diagnosis Date  . Hypertension   . Anemia of chronic disease   . Cholecystitis     a. 08/27/2011  . Constipation   . Pericardial effusion     a.  Small by CT 08/22/11;  b.  Large by CT 08/27/11  . Inguinal lymphadenopathy     a. bilateral - s/p biopsy 07/2011  . Fibromyalgia   . Chronic diastolic CHF (congestive heart failure) ([place])     a. Echo 3/13:  EF 55-60%;  b. Echo (11/15):  Mild LVH, EF 60-65%, no RWMA, Gr 1  DD, MAC, mild LAE, normal RVF, mild RAE;  c.  Echo 5/16:  severe LVH, EF 55-60%, no RWMA, Gr 1 DD, MAC, mild LAE  . HLD (hyperlipidemia)   . Hypothyroidism   . GERD (gastroesophageal reflux disease)   . CAD (coronary artery disease)     a. Lexiscan Myoview (11/15):  Inf, apical cap and apical lateral ischemia, EF 56%, inf HK, Moderate Risk;  b. LHC (11/15):  mid to dist Dx 80%, mid RCA 99% (functional CTO), dist RCA 80% >> PCI: balloon angioplasty to mid RCA (could not deliver stent) - plan staged PCI with HSRA of RCA; Dx to be tx medically >> PCI: Rotoblator atherectomy/DES to [facility]  . OSA (obstructive sleep apnea)     adjustable bed  . Shortness of breath dyspnea     too much Fluid  . Pneumonia ~ 2007  . Insulin dependent diabetes mellitus (HCC)     Type I  . Claustrophobia     when things get around his face.   [name] Kitchen ESRD (end stage renal disease) ([name])     a. 1995 s/p cadaveric transplant w/ susbequent failure after 18 yrs;  b.Dialysis initiated 07/2011 [facility] M-W-F  . Constipation   . Arthritis     "hands, right knee" (03/19/2014)  . History of blood transfusion    Past  Surgical History  Procedure Laterality Date  . Kidney transplant  04/25/1993  . Cataract extraction w/ intraocular lens  implant, bilateral Bilateral 1990's  . Arteriovenous graft placement Left 1993?    forearm  . Arteriovenous graft placement Right 1993?    leg  . Toe amputation Bilateral after 2005  . Below knee leg amputation Left 2010  . Carpal tunnel release Bilateral after 2005  . Shoulder arthroscopy Left ~ 2006    frozen  . Av fistula placement  07/29/2011    Procedure: ARTERIOVENOUS (AV) FISTULA CREATION;  Surgeon: Mal Misty, MD;  Location: [place];  Service: Vascular;  Laterality: Right;  Brachial cephalic  . Insertion of dialysis catheter  08/01/2011    Procedure: INSERTION OF DIALYSIS CATHETER;  Surgeon: Rosetta Posner, MD;  Location: [place];  Service: Vascular;  Laterality: Right;  insertion of  dialysis catheter on right internal jugular vein  . Lymph node biopsy  08/10/2011    Procedure: LYMPH NODE BIOPSY;  Surgeon: Merrie Roof, MD;  Location: [place];  Service: General;  Laterality: Left;  left groin lymph biopsy  . Colonoscopy  08/29/2011    Procedure: COLONOSCOPY;  Surgeon: Jerene Bears, MD;  Location: [place];  Service: Gastroenterology;  Laterality: N/A;  . Appendectomy  ~ 2006  . Below knee leg amputation Left   . Umbilical hernia repair  1990's  . Cardiac catheterization  03/19/2014  . Av graft placement Left 1993?    "attempted one in my wrist; didn't take"  . Neuroplasty / transposition ulnar nerve at elbow Left after 2005  . Venogram Right 07/28/2011    Procedure: VENOGRAM;  Surgeon: Conrad Moores Hill, MD;  Location: [facility] CATH LAB;  Service: Cardiovascular;  Laterality: Right;  . Left heart catheterization with coronary angiogram N/A 03/19/2014    Procedure: LEFT HEART CATHETERIZATION WITH CORONARY ANGIOGRAM;  Surgeon: Burnell Blanks, MD;  Location: [facility] CATH LAB;  Service: Cardiovascular;  Laterality: N/A;  . Cardiac catheterization  03/19/2014    Procedure: CORONARY BALLOON ANGIOPLASTY;  Surgeon: Burnell Blanks, MD;  Location: [facility] CATH LAB;  Service: Cardiovascular;;  . Coronary angioplasty with stent placement  04/09/2014        ptca/des mid lad   . Angioplasty  04/09/2014    RCA   . Femoral artery repair  04/09/2014    ANGIOSEAL  . Percutaneous coronary rotoblator intervention (pci-r) N/A 04/09/2014    Procedure: PERCUTANEOUS CORONARY ROTOBLATOR INTERVENTION (PCI-R);  Surgeon: Burnell Blanks, MD;  Location: [facility] CATH LAB;  Service: Cardiovascular;  Laterality: N/A;  . Revison of arteriovenous fistula Right [id number]    Procedure: PLICATION / REVISION OF ARTERIOVENOUS FISTULA;  Surgeon: Angelia Mould, MD;  Location: [place];  Service: Vascular;  Laterality: Right;  . Peripheral vascular catheterization N/A 12/25/2014    Procedure: Upper Extremity  Venography;  Surgeon: Conrad Pineville, MD;  Location: [place] CV LAB;  Service: Cardiovascular;  Laterality: N/A;  . Carpal tunnel release Bilateral   . Eye surgery Bilateral     cataract  . Amputation of replicated toes Right   . Vitrectomy Bilateral   . Peritoneal catheter insertion    . Peritoneal catheter removal    . Arteriovenous graft placement Left     Thigh  . Arteriovenous graft removed Left     Thigh  . Av fistula placement Left 01/20/2015    Procedure: LIGATION OF RIGHT ARTERIOVENOUS FISTULA WITH EXCISION OF ANEURYSM;  Surgeon: Angelia Mould,  MD;  Location: [place];  Service: Vascular;  Laterality: Left;  . Insertion of dialysis catheter Right 01/20/2015    Procedure: INSERTION OF DIALYSIS CATHETER - RIGHT INTERNAL JUGULAR ;  Surgeon: Angelia Mould, MD;  Location: [facility] OR;  Service: Vascular;  Laterality: Right;   Family History  Problem Relation Age of Onset  . Malignant hyperthermia Neg Hx   . Heart attack Neg Hx   . Stroke Neg Hx   . Colon polyps Father   . Diabetes Father   . Hypertension Father   . Breast cancer Maternal Aunt   . Cancer Maternal Aunt     Breast and Bone  . Hypertension Mother   . Diabetes Mother   . Hyperlipidemia Mother    Social History  Substance Use Topics  . Smoking status: Never Smoker   . Smokeless tobacco: Never Used  . Alcohol Use: Yes     Comment: 11/19/14 "might have a drink 2-3 times/year"    Review of Systems  Constitutional: Negative for fever and chills.  HENT: Negative for sore throat.   Eyes: Negative for redness.  Respiratory: Negative for cough and shortness of breath.   Cardiovascular: Positive for chest pain. Negative for palpitations and leg swelling.  Gastrointestinal: Negative for vomiting, abdominal pain and diarrhea.  Genitourinary: Negative for flank pain.  Musculoskeletal: Negative for back pain and neck pain.  Skin: Negative for rash.  Neurological: Positive for headaches. Negative for syncope,  weakness and numbness.  Hematological: Does not bruise/bleed easily.  Psychiatric/Behavioral: Negative for confusion.      Allergies  Contrast media; Morphine and related; Amoxicillin; Ciprofloxacin; Clindamycin/lincomycin; Codeine; Doxycycline; Hydrocodone; Levofloxacin; Oxycodone; Plavix; Rocephin; and Vasotec  Home Medications   Prior to Admission medications   Medication Sig Start Date End Date Taking? Authorizing Provider  fluticasone (FLONASE) 50 MCG/ACT nasal spray Place 2 sprays into both nostrils daily.   Yes Historical Provider, MD  metoprolol succinate (TOPROL-XL) 25 MG 24 hr tablet Take 25 mg by mouth once.   Yes Historical Provider, MD  metoprolol succinate (TOPROL-XL) 50 MG 24 hr tablet Take 1 tablet (50 mg total) by mouth daily. Take with or immediately following a meal. 02/27/15  Yes Shanker Kristeen Mans, MD  allopurinol (ZYLOPRIM) 100 MG tablet Take 200 mg by mouth daily.  03/14/12   Historical Provider, MD  amLODipine (NORVASC) 10 MG tablet Take 1 tablet (10 mg total) by mouth daily. 02/27/15   Shanker Kristeen Mans, MD  atorvastatin (LIPITOR) 40 MG tablet Take 40 mg by mouth at bedtime. 02/13/15   Historical Provider, MD  calcium acetate (PHOSLO) 667 MG capsule Take 667-1,334 mg by mouth See admin instructions. Take 1,334 mg (2 capsules) with meals and 667 (1 capsule) with snacks 09/19/12   Historical Provider, MD  diphenhydrAMINE (BENADRYL) 25 mg capsule Take 1-2 capsules (25-50 mg total) by mouth every 6 (six) hours as needed for itching. 04/11/14   Rhonda G Barrett, PA-C  docusate sodium (COLACE) 100 MG capsule Take 200 mg by mouth 3 (three) times daily.     Historical Provider, MD  EFFIENT 10 MG TABS tablet TAKE 1 TABLET BY MOUTH EVERY DAY 10/08/14   Burnell Blanks, MD  fenofibrate (TRICOR) 48 MG tablet Take 1 tablet (48 mg total) by mouth daily. 02/27/15   Shanker Kristeen Mans, MD  furosemide (LASIX) 80 MG tablet Take 80 mg by mouth 2 (two) times daily.  11/25/14   Historical  Provider, MD  GLUCAGON EMERGENCY 1 MG injection Inject  1 mg into the muscle daily as needed (low blood sugar).  01/19/12   Historical Provider, MD  hydrocortisone 2.5 % cream APPLY 2-3 TIMES DAILY AS NEEDED TO AFFECTED AREA(S). 10/30/14   Historical Provider, MD  HYDROmorphone (DILAUDID) 2 MG tablet Take 1 tablet (2 mg total) by mouth 3 (three) times daily as needed for severe pain. 11/20/14   Alvia Grove, PA-C  Insulin Human (INSULIN PUMP) 100 unit/ml SOLN Inject 72 each into the skin continuous. Humalog    Historical Provider, MD  levothyroxine (SYNTHROID, LEVOTHROID) 100 MCG tablet Take 100 mcg by mouth at bedtime.     Historical Provider, MD  loratadine (CLARITIN) 10 MG tablet Take 10 mg by mouth daily.    Historical Provider, MD  metoCLOPramide (REGLAN) 5 MG tablet Take 5 mg by mouth 4 (four) times daily - after meals and at bedtime. 05/26/11   Historical Provider, MD  multivitamin (RENA-VIT) TABS tablet Take 1 tablet by mouth daily.    Historical Provider, MD  nitroGLYCERIN (NITROSTAT) 0.4 MG SL tablet Place 0.4 mg under the tongue every 5 (five) minutes as needed for chest pain.     [name], MD  predniSONE (DELTASONE) 20 MG tablet Take 20 mg by mouth See admin instructions. Starting 02/28/15: take 1 tablet (20 mg) by mouth twice daily for 5 days, then take 1 tablet (20 mg) daily for 5 days, then stop 02/23/15   Historical Provider, MD  promethazine (PHENERGAN) 25 MG tablet Take 1 tablet (25 mg total) by mouth every 6 (six) hours as needed for nausea or vomiting. For nausea 11/27/14   Liliane Shi, PA-C  ranitidine (ZANTAC) 150 MG tablet Take 150 mg by mouth 2 (two) times daily. 11/01/14   Historical Provider, MD  rOPINIRole (REQUIP) 2 MG tablet Take 2 mg by mouth daily with supper.     Historical Provider, MD  sevelamer carbonate (RENVELA) 800 MG tablet Take 1,600 mg by mouth 3 (three) times daily with meals.    Historical Provider, MD   BP 188/82 mmHg  Pulse 88  Temp(Src) 97.7 F  (36.5 C) (Oral)  Resp 22  Wt 286 lb 2 oz (129.785 kg)  SpO2 99% Physical Exam  Constitutional: He is oriented to person, place, and time. He appears well-developed and well-nourished. No distress.  HENT:  Mouth/Throat: Oropharynx is clear and moist.  No sinus or temporal tenderness  Eyes: Conjunctivae are normal. Pupils are equal, round, and reactive to light.  Neck: Neck supple. No tracheal deviation present.  No stiffness or rigidity  Cardiovascular: Normal rate, regular rhythm, normal heart sounds and intact distal pulses.  Exam reveals no gallop and no friction rub.   No murmur heard. Pulmonary/Chest: Effort normal and breath sounds normal. No accessory muscle usage. No respiratory distress.  Dialysis cath right chest without sign of infection  Abdominal: Soft. Bowel sounds are normal. He exhibits no distension. There is no tenderness.  Musculoskeletal: Normal range of motion. He exhibits no edema or tenderness.  Left lower ext amp.   Neurological: He is alert and oriented to person, place, and time.  Speech clear, fluent. Moves bil ext purposefully w good strength.   Skin: Skin is warm and dry. He is not diaphoretic.  Psychiatric:  Mildly anxious.   Nursing note and vitals reviewed.   ED Course  Procedures (including critical care time) Labs Review  Results for orders placed or performed during the hospital encounter of [id number]  Basic metabolic panel  Result Value  Ref Range   Sodium 135 135 - 145 mmol/L   Potassium 5.2 (H) 3.5 - 5.1 mmol/L   Chloride 99 (L) 101 - 111 mmol/L   CO2 21 (L) 22 - 32 mmol/L   Glucose, Bld 294 (H) 65 - 99 mg/dL   BUN 87 (H) 6 - 20 mg/dL   Creatinine, Ser 9.57 (H) 0.61 - 1.24 mg/dL   Calcium 9.3 8.9 - 10.3 mg/dL   GFR calc non Af Amer 6 (L) >60 mL/min   GFR calc Af Amer 7 (L) >60 mL/min   Anion gap 15 5 - 15  CBC  Result Value Ref Range   WBC 9.1 4.0 - 10.5 K/uL   RBC 3.80 (L) 4.22 - 5.81 MIL/uL   Hemoglobin 12.1 (L) 13.0 - 17.0 g/dL    HCT 37.3 (L) 39.0 - 52.0 %   MCV 98.2 78.0 - 100.0 fL   MCH 31.8 26.0 - 34.0 pg   MCHC 32.4 30.0 - 36.0 g/dL   RDW 15.7 (H) 11.5 - 15.5 %   Platelets 198 150 - 400 K/uL  I-stat troponin, ED (not at [facility], [facility])  Result Value Ref Range   Troponin i, poc 0.01 0.00 - 0.08 ng/mL   Comment 3          I-stat troponin, ED  Result Value Ref Range   Troponin i, poc 0.01 0.00 - 0.08 ng/mL   Comment 3           Dg Chest 2 View  03/01/2015  CLINICAL DATA:  Chest pain. EXAM: CHEST  2 VIEW COMPARISON:  02/24/2015 chest radiograph FINDINGS: Right internal jugular central venous catheter terminates in the lower third of the superior vena cava. Stable cardiomediastinal silhouette with mild cardiomegaly. No pneumothorax. No pleural effusion. Mild pulmonary edema. No focal lung consolidation. IMPRESSION: Stable mild cardiomegaly with mild pulmonary edema, most in keeping with mild congestive heart failure. Electronically Signed   By: Ilona Sorrel M.D.   On: 03/01/2015 20:01   Dg Chest 2 View  02/24/2015  CLINICAL DATA:  Chest pain with dizziness for 2 hours. Diabetes. Dialysis patient. EXAM: CHEST  2 VIEW COMPARISON:  01/21/2015 FINDINGS: Right central venous catheter with tip over the cavoatrial junction. No change in position since previous study. No pneumothorax. Mild cardiac enlargement and pulmonary vascular congestion demonstrating mild progression since previous study. No focal airspace disease or consolidation. No edema. Blunting of the costophrenic angle on the right suggesting small pleural effusion. Mediastinal contours appear intact. Mild degenerative changes in the spine. IMPRESSION: Cardiac enlargement with mild vascular congestion and small right pleural effusion. Electronically Signed   By: Lucienne Capers M.D.   On: 02/24/2015 22:33   Nm Myocar Multi W/spect W/wall Motion / Ef  02/26/2015   There was no ST segment deviation noted during stress.  Defect 1: There is a small defect of mild  severity present in the mid anterior location.  The study is normal.  This is a low risk study.  The left ventricular ejection fraction is mildly decreased (45-54%).        I have personally reviewed and evaluated these images and lab results as part of my medical decision-making.   EKG Interpretation   Date/Time:  Sunday March 01 2015 19:14:44 EST Ventricular Rate:  89 PR Interval:  134 QRS Duration: 84 QT Interval:  358 QTC Calculation: 435 R Axis:   79 Text Interpretation:  Normal sinus rhythm Normal ECG No significant change  since last tracing  Confirmed by Ashok Cordia  MD, Lennette Bihari ([id number]) on 03/01/2015  7:23:29 PM      MDM   Iv ns. Labs. Cxr.  Reviewed nursing notes and prior charts for additional history.   Tylenol po. Gi meds for symptom relief.  Pt requests pain med, ultram po.  Recheck pt comfortable.  bp elevated but no critically so, nor requiring emergent lowering.  Initial and delta troponin both normal, and not increasing.   Pt due for hd in AM.  Will follow up very closely w his doctors.  Pt w very recent stress test, neg - rec close cardiology follow up.  Pt currently appears stable for d/c.  Return precautions provided.       Lajean Saver, MD 03/01/15 [phone]

## 2015-03-01 NOTE — Discharge Instructions (Signed)
It was our pleasure to provide your ER care today - we hope that you feel better.  Take tylenol as need.  Go to dialysis as planned tomorrow.  Follow up with your doctor in the next 1-2 days - discuss medications, including your blood pressure medication with them.  For chest discomfort, follow up with your cardiologist in the next couple days.  Return to ER if worse, new symptoms, fevers, difficulty breathing, recurrent/persistent chest pain, severe headache, other concern.  You were given pain medication in the ER - no driving for the next 4 hours.   Nonspecific Chest Pain  Chest pain can be caused by many different conditions. There is always a chance that your pain could be related to something serious, such as a heart attack or a blood clot in your lungs. Chest pain can also be caused by conditions that are not life-threatening. If you have chest pain, it is very important to follow up with your health care provider. CAUSES  Chest pain can be caused by:  Heartburn.  Pneumonia or bronchitis.  Anxiety or stress.  Inflammation around your heart (pericarditis) or lung (pleuritis or pleurisy).  A blood clot in your lung.  A collapsed lung (pneumothorax). It can develop suddenly on its own (spontaneous pneumothorax) or from trauma to the chest.  Shingles infection (varicella-zoster virus).  Heart attack.  Damage to the bones, muscles, and cartilage that make up your chest wall. This can include:  Bruised bones due to injury.  Strained muscles or cartilage due to frequent or repeated coughing or overwork.  Fracture to one or more ribs.  Sore cartilage due to inflammation (costochondritis). RISK FACTORS  Risk factors for chest pain may include:  Activities that increase your risk for trauma or injury to your chest.  Respiratory infections or conditions that cause frequent coughing.  Medical conditions or overeating that can cause heartburn.  Heart disease or family  history of heart disease.  Conditions or health behaviors that increase your risk of developing a blood clot.  Having had chicken pox (varicella zoster). SIGNS AND SYMPTOMS Chest pain can feel like:  Burning or tingling on the surface of your chest or deep in your chest.  Crushing, pressure, aching, or squeezing pain.  Dull or sharp pain that is worse when you move, cough, or take a deep breath.  Pain that is also felt in your back, neck, shoulder, or arm, or pain that spreads to any of these areas. Your chest pain may come and go, or it may stay constant. DIAGNOSIS Lab tests or other studies may be needed to find the cause of your pain. Your health care provider may have you take a test called an ambulatory ECG (electrocardiogram). An ECG records your heartbeat patterns at the time the test is performed. You may also have other tests, such as:  Transthoracic echocardiogram (TTE). During echocardiography, sound waves are used to create a picture of all of the heart structures and to look at how blood flows through your heart.  Transesophageal echocardiogram (TEE).This is a more advanced imaging test that obtains images from inside your body. It allows your health care provider to see your heart in finer detail.  Cardiac monitoring. This allows your health care provider to monitor your heart rate and rhythm in real time.  Holter monitor. This is a portable device that records your heartbeat and can help to diagnose abnormal heartbeats. It allows your health care provider to track your heart activity for several days,  if needed.  Stress tests. These can be done through exercise or by taking medicine that makes your heart beat more quickly.  Blood tests.  Imaging tests. TREATMENT  Your treatment depends on what is causing your chest pain. Treatment may include:  Medicines. These may include:  Acid blockers for heartburn.  Anti-inflammatory medicine.  Pain medicine for  inflammatory conditions.  Antibiotic medicine, if an infection is present.  Medicines to dissolve blood clots.  Medicines to treat coronary artery disease.  Supportive care for conditions that do not require medicines. This may include:  Resting.  Applying heat or cold packs to injured areas.  Limiting activities until pain decreases. HOME CARE INSTRUCTIONS  If you were prescribed an antibiotic medicine, finish it all even if you start to feel better.  Avoid any activities that bring on chest pain.  Do not use any tobacco products, including cigarettes, chewing tobacco, or electronic cigarettes. If you need help quitting, ask your health care provider.  Do not drink alcohol.  Take medicines only as directed by your health care provider.  Keep all follow-up visits as directed by your health care provider. This is important. This includes any further testing if your chest pain does not go away.  If heartburn is the cause for your chest pain, you may be told to keep your head raised (elevated) while sleeping. This reduces the chance that acid will go from your stomach into your esophagus.  Make lifestyle changes as directed by your health care provider. These may include:  Getting regular exercise. Ask your health care provider to suggest some activities that are safe for you.  Eating a heart-healthy diet. A registered dietitian can help you to learn healthy eating options.  Maintaining a healthy weight.  Managing diabetes, if necessary.  Reducing stress. SEEK MEDICAL CARE IF:  Your chest pain does not go away after treatment.  You have a rash with blisters on your chest.  You have a fever. SEEK IMMEDIATE MEDICAL CARE IF:   Your chest pain is worse.  You have an increasing cough, or you cough up blood.  You have severe abdominal pain.  You have severe weakness.  You faint.  You have chills.  You have sudden, unexplained chest discomfort.  You have sudden,  unexplained discomfort in your arms, back, neck, or jaw.  You have shortness of breath at any time.  You suddenly start to sweat, or your skin gets clammy.  You feel nauseous or you vomit.  You suddenly feel light-headed or dizzy.  Your heart begins to beat quickly, or it feels like it is skipping beats. These symptoms may represent a serious problem that is an emergency. Do not wait to see if the symptoms will go away. Get medical help right away. Call your local emergency services (911 in the [place] Do not drive yourself to the hospital.   This information is not intended to replace advice given to you by your health care provider. Make sure you discuss any questions you have with your health care provider.   Document Released: 01/19/2005 Document Revised: 05/02/2014 Document Reviewed: 11/15/2013 Elsevier Interactive Patient Education 2016 [employer].  Hypertension Hypertension, commonly called high blood pressure, is when the force of blood pumping through your arteries is too strong. Your arteries are the blood vessels that carry blood from your heart throughout your body. A blood pressure reading consists of a higher number over a lower number, such as 110/72. The higher number (systolic) is the pressure  inside your arteries when your heart pumps. The lower number (diastolic) is the pressure inside your arteries when your heart relaxes. Ideally you want your blood pressure below 120/80. Hypertension forces your heart to work harder to pump blood. Your arteries may become narrow or stiff. Having untreated or uncontrolled hypertension can cause heart attack, stroke, kidney disease, and other problems. RISK FACTORS Some risk factors for high blood pressure are controllable. Others are not.  Risk factors you cannot control include:   Race. You may be at higher risk if you are African American.  Age. Risk increases with age.  Gender. Men are at higher risk than women before age 42  years. After age 61, women are at higher risk than men. Risk factors you can control include:  Not getting enough exercise or physical activity.  Being overweight.  Getting too much fat, sugar, calories, or salt in your diet.  Drinking too much alcohol. SIGNS AND SYMPTOMS Hypertension does not usually cause signs or symptoms. Extremely high blood pressure (hypertensive crisis) may cause headache, anxiety, shortness of breath, and nosebleed. DIAGNOSIS To check if you have hypertension, your health care provider will measure your blood pressure while you are seated, with your arm held at the level of your heart. It should be measured at least twice using the same arm. Certain conditions can cause a difference in blood pressure between your right and left arms. A blood pressure reading that is higher than normal on one occasion does not mean that you need treatment. If it is not clear whether you have high blood pressure, you may be asked to return on a different day to have your blood pressure checked again. Or, you may be asked to monitor your blood pressure at home for 1 or more weeks. TREATMENT Treating high blood pressure includes making lifestyle changes and possibly taking medicine. Living a healthy lifestyle can help lower high blood pressure. You may need to change some of your habits. Lifestyle changes may include:  Following the DASH diet. This diet is high in fruits, vegetables, and whole grains. It is low in salt, red meat, and added sugars.  Keep your sodium intake below 2,300 mg per day.  Getting at least 30-45 minutes of aerobic exercise at least 4 times per week.  Losing weight if necessary.  Not smoking.  Limiting alcoholic beverages.  Learning ways to reduce stress. Your health care provider may prescribe medicine if lifestyle changes are not enough to get your blood pressure under control, and if one of the following is true:  You are 14-91 years of age and your  systolic blood pressure is above 140.  You are 34 years of age or older, and your systolic blood pressure is above 150.  Your diastolic blood pressure is above 90.  You have diabetes, and your systolic blood pressure is over [id number] or your diastolic blood pressure is over 90.  You have kidney disease and your blood pressure is above 140/90.  You have heart disease and your blood pressure is above 140/90. Your personal target blood pressure may vary depending on your medical conditions, your age, and other factors. HOME CARE INSTRUCTIONS  Have your blood pressure rechecked as directed by your health care provider.   Take medicines only as directed by your health care provider. Follow the directions carefully. Blood pressure medicines must be taken as prescribed. The medicine does not work as well when you skip doses. Skipping doses also puts you at risk for problems.  Do not smoke.   Monitor your blood pressure at home as directed by your health care provider. SEEK MEDICAL CARE IF:   You think you are having a reaction to medicines taken.  You have recurrent headaches or feel dizzy.  You have swelling in your ankles.  You have trouble with your vision. SEEK IMMEDIATE MEDICAL CARE IF:  You develop a severe headache or confusion.  You have unusual weakness, numbness, or feel faint.  You have severe chest or abdominal pain.  You vomit repeatedly.  You have trouble breathing. MAKE SURE YOU:   Understand these instructions.  Will watch your condition.  Will get help right away if you are not doing well or get worse.   This information is not intended to replace advice given to you by your health care provider. Make sure you discuss any questions you have with your health care provider.   Document Released: 04/11/2005 Document Revised: 08/26/2014 Document Reviewed: 02/01/2013 Elsevier Interactive Patient Education [employer].

## 2015-03-01 NOTE — ED Notes (Signed)
Pt. Left with all belongings. Discharge instructions were reviewed and all questions were answered.  

## 2015-03-04 ENCOUNTER — Telehealth: Payer: Self-pay | Admitting: Physician Assistant

## 2015-03-04 ENCOUNTER — Other Ambulatory Visit: Payer: Self-pay

## 2015-03-04 NOTE — Telephone Encounter (Signed)
I believe pt may need to be seen in the office for pre op clearance. I will route phone note to Richardson Dopp, PA for recommendations.

## 2015-03-04 NOTE — Telephone Encounter (Signed)
Agree. cdm 

## 2015-03-04 NOTE — Telephone Encounter (Signed)
Request for surgical clearance:  1. What type of surgery is being performed? Left arm Fistuala  2. When is this surgery scheduled? 04/30/2015  3. Are there any medications that need to be held prior to surgery and how long? EFFIENT   7 days before surgery  4. Name of physician performing surgery? Dr.Dixon   5. What is your office phone and fax number?   [phone]   Fax  [phone]

## 2015-03-05 NOTE — Telephone Encounter (Signed)
S/w with Colletta Maryland at Dr. Mee Hives office about Effient and upcoming surgery 04/30/15. Colletta Maryland stated need pt to be off Effient at least 7 days prior to surgery. I asked if they could fax over a surgery clearance form, Colletta Maryland agreeable to plan of care.

## 2015-03-05 NOTE — Telephone Encounter (Signed)
I see him in Dec (appointment already scheduled) We can make recommendations regarding his Effient at that visit and decide if he is cleared for surgery. Richardson Dopp, PA-C   03/05/2015 1:26 PM

## 2015-03-07 ENCOUNTER — Other Ambulatory Visit: Payer: Self-pay | Admitting: Cardiovascular Disease

## 2015-03-09 ENCOUNTER — Other Ambulatory Visit: Payer: Self-pay

## 2015-03-09 ENCOUNTER — Emergency Department (HOSPITAL_COMMUNITY): Payer: Medicare Other

## 2015-03-09 ENCOUNTER — Encounter (HOSPITAL_COMMUNITY): Payer: Self-pay

## 2015-03-09 ENCOUNTER — Inpatient Hospital Stay (HOSPITAL_COMMUNITY)
Admission: EM | Admit: 2015-03-09 | Discharge: 2015-03-10 | DRG: 189 | Disposition: A | Discharge status: Home / Self Care | Payer: Medicare Other | Attending: Internal Medicine | Admitting: Internal Medicine

## 2015-03-09 DIAGNOSIS — Z9841 Cataract extraction status, right eye: Secondary | ICD-10-CM | POA: Diagnosis not present

## 2015-03-09 DIAGNOSIS — Z940 Kidney transplant status: Secondary | ICD-10-CM | POA: Diagnosis not present

## 2015-03-09 DIAGNOSIS — J9601 Acute respiratory failure with hypoxia: Secondary | ICD-10-CM | POA: Diagnosis present

## 2015-03-09 DIAGNOSIS — F40240 Claustrophobia: Secondary | ICD-10-CM | POA: Diagnosis present

## 2015-03-09 DIAGNOSIS — I5032 Chronic diastolic (congestive) heart failure: Secondary | ICD-10-CM | POA: Diagnosis present

## 2015-03-09 DIAGNOSIS — E785 Hyperlipidemia, unspecified: Secondary | ICD-10-CM | POA: Diagnosis present

## 2015-03-09 DIAGNOSIS — K219 Gastro-esophageal reflux disease without esophagitis: Secondary | ICD-10-CM | POA: Diagnosis present

## 2015-03-09 DIAGNOSIS — I2510 Atherosclerotic heart disease of native coronary artery without angina pectoris: Secondary | ICD-10-CM | POA: Diagnosis present

## 2015-03-09 DIAGNOSIS — G4733 Obstructive sleep apnea (adult) (pediatric): Secondary | ICD-10-CM | POA: Diagnosis present

## 2015-03-09 DIAGNOSIS — G8929 Other chronic pain: Secondary | ICD-10-CM | POA: Diagnosis present

## 2015-03-09 DIAGNOSIS — J810 Acute pulmonary edema: Principal | ICD-10-CM | POA: Diagnosis present

## 2015-03-09 DIAGNOSIS — M1990 Unspecified osteoarthritis, unspecified site: Secondary | ICD-10-CM | POA: Diagnosis present

## 2015-03-09 DIAGNOSIS — N2581 Secondary hyperparathyroidism of renal origin: Secondary | ICD-10-CM | POA: Diagnosis present

## 2015-03-09 DIAGNOSIS — N186 End stage renal disease: Secondary | ICD-10-CM | POA: Diagnosis present

## 2015-03-09 DIAGNOSIS — F419 Anxiety disorder, unspecified: Secondary | ICD-10-CM | POA: Diagnosis present

## 2015-03-09 DIAGNOSIS — Z955 Presence of coronary angioplasty implant and graft: Secondary | ICD-10-CM | POA: Diagnosis not present

## 2015-03-09 DIAGNOSIS — E8770 Fluid overload, unspecified: Secondary | ICD-10-CM | POA: Diagnosis present

## 2015-03-09 DIAGNOSIS — Z9842 Cataract extraction status, left eye: Secondary | ICD-10-CM

## 2015-03-09 DIAGNOSIS — Z992 Dependence on renal dialysis: Secondary | ICD-10-CM | POA: Diagnosis not present

## 2015-03-09 DIAGNOSIS — Z89519 Acquired absence of unspecified leg below knee: Secondary | ICD-10-CM

## 2015-03-09 DIAGNOSIS — E11649 Type 2 diabetes mellitus with hypoglycemia without coma: Secondary | ICD-10-CM | POA: Diagnosis present

## 2015-03-09 DIAGNOSIS — M797 Fibromyalgia: Secondary | ICD-10-CM | POA: Diagnosis present

## 2015-03-09 DIAGNOSIS — F4010 Social phobia, unspecified: Secondary | ICD-10-CM | POA: Diagnosis present

## 2015-03-09 DIAGNOSIS — Z79899 Other long term (current) drug therapy: Secondary | ICD-10-CM | POA: Diagnosis not present

## 2015-03-09 DIAGNOSIS — I132 Hypertensive heart and chronic kidney disease with heart failure and with stage 5 chronic kidney disease, or end stage renal disease: Secondary | ICD-10-CM | POA: Diagnosis present

## 2015-03-09 DIAGNOSIS — D638 Anemia in other chronic diseases classified elsewhere: Secondary | ICD-10-CM | POA: Diagnosis present

## 2015-03-09 DIAGNOSIS — Z89512 Acquired absence of left leg below knee: Secondary | ICD-10-CM

## 2015-03-09 DIAGNOSIS — E039 Hypothyroidism, unspecified: Secondary | ICD-10-CM | POA: Diagnosis present

## 2015-03-09 DIAGNOSIS — E162 Hypoglycemia, unspecified: Secondary | ICD-10-CM | POA: Diagnosis not present

## 2015-03-09 DIAGNOSIS — Z7982 Long term (current) use of aspirin: Secondary | ICD-10-CM

## 2015-03-09 DIAGNOSIS — R0600 Dyspnea, unspecified: Secondary | ICD-10-CM | POA: Diagnosis present

## 2015-03-09 DIAGNOSIS — D649 Anemia, unspecified: Secondary | ICD-10-CM | POA: Diagnosis not present

## 2015-03-09 DIAGNOSIS — Z961 Presence of intraocular lens: Secondary | ICD-10-CM | POA: Diagnosis present

## 2015-03-09 DIAGNOSIS — Z9641 Presence of insulin pump (external) (internal): Secondary | ICD-10-CM | POA: Diagnosis present

## 2015-03-09 DIAGNOSIS — J811 Chronic pulmonary edema: Secondary | ICD-10-CM | POA: Diagnosis present

## 2015-03-09 DIAGNOSIS — E119 Type 2 diabetes mellitus without complications: Secondary | ICD-10-CM

## 2015-03-09 DIAGNOSIS — Z794 Long term (current) use of insulin: Secondary | ICD-10-CM | POA: Diagnosis not present

## 2015-03-09 DIAGNOSIS — E875 Hyperkalemia: Secondary | ICD-10-CM | POA: Diagnosis present

## 2015-03-09 DIAGNOSIS — E108 Type 1 diabetes mellitus with unspecified complications: Secondary | ICD-10-CM | POA: Diagnosis not present

## 2015-03-09 LAB — COMPREHENSIVE METABOLIC PANEL
ALT: 14 U/L — ABNORMAL LOW (ref 17–63)
AST: 18 U/L (ref 15–41)
Albumin: 3.5 g/dL (ref 3.5–5.0)
Alkaline Phosphatase: 70 U/L (ref 38–126)
Anion gap: 11 (ref 5–15)
BUN: 67 mg/dL — ABNORMAL HIGH (ref 6–20)
CO2: 27 mmol/L (ref 22–32)
Calcium: 8.9 mg/dL (ref 8.9–10.3)
Chloride: 101 mmol/L (ref 101–111)
Creatinine, Ser: 9.27 mg/dL — ABNORMAL HIGH (ref 0.61–1.24)
GFR calc Af Amer: 7 mL/min — ABNORMAL LOW (ref >60)
GFR calc non Af Amer: 6 mL/min — ABNORMAL LOW (ref >60)
Glucose, Bld: 146 mg/dL — ABNORMAL HIGH (ref 65–99)
Potassium: 5.6 mmol/L — ABNORMAL HIGH (ref 3.5–5.1)
Sodium: 139 mmol/L (ref 135–145)
Total Bilirubin: 0.5 mg/dL (ref 0.3–1.2)
Total Protein: 7.2 g/dL (ref 6.5–8.1)

## 2015-03-09 LAB — CBC WITH DIFFERENTIAL/PLATELET
Basophils Absolute: 0 10*3/uL (ref 0.0–0.1)
Basophils Relative: 0 %
Eosinophils Absolute: 0.4 10*3/uL (ref 0.0–0.7)
Eosinophils Relative: 4 %
HCT: 36.5 % — ABNORMAL LOW (ref 39.0–52.0)
Hemoglobin: 11.3 g/dL — ABNORMAL LOW (ref 13.0–17.0)
Lymphocytes Relative: 17 %
Lymphs Abs: 1.7 10*3/uL (ref 0.7–4.0)
MCH: 31.7 pg (ref 26.0–34.0)
MCHC: 31 g/dL (ref 30.0–36.0)
MCV: 102.5 fL — ABNORMAL HIGH (ref 78.0–100.0)
Monocytes Absolute: 0.5 10*3/uL (ref 0.1–1.0)
Monocytes Relative: 5 %
Neutro Abs: 7.8 10*3/uL — ABNORMAL HIGH (ref 1.7–7.7)
Neutrophils Relative %: 74 %
Platelets: 197 10*3/uL (ref 150–400)
RBC: 3.56 MIL/uL — ABNORMAL LOW (ref 4.22–5.81)
RDW: 15.4 % (ref 11.5–15.5)
WBC: 10.4 10*3/uL (ref 4.0–10.5)

## 2015-03-09 LAB — GLUCOSE, CAPILLARY
Glucose-Capillary: 350 mg/dL — ABNORMAL HIGH (ref 65–99)
Glucose-Capillary: 178 mg/dL — ABNORMAL HIGH (ref 65–99)

## 2015-03-09 LAB — CBG MONITORING, ED
Glucose-Capillary: 40 mg/dL — CL (ref 65–99)
Glucose-Capillary: 51 mg/dL — ABNORMAL LOW (ref 65–99)
Glucose-Capillary: 87 mg/dL (ref 65–99)
Glucose-Capillary: 104 mg/dL — ABNORMAL HIGH (ref 65–99)

## 2015-03-09 LAB — TROPONIN I: Troponin I: 0.03 ng/mL (ref <0.031)

## 2015-03-09 LAB — MRSA PCR SCREENING: MRSA by PCR: NEGATIVE

## 2015-03-09 MED ORDER — METOCLOPRAMIDE HCL 10 MG PO TABS
5.0000 mg | ORAL_TABLET | Freq: Three times a day (TID) | ORAL | Status: DC
  Administered 2015-03-09 – 2015-03-10 (×4): 5 mg via ORAL
  Filled 2015-03-09 (×4): qty 1

## 2015-03-09 MED ORDER — LEVOTHYROXINE SODIUM 100 MCG PO TABS
100.0000 ug | ORAL_TABLET | Freq: Every day | ORAL | Status: DC
  Administered 2015-03-09: 100 ug via ORAL
  Filled 2015-03-09: qty 1

## 2015-03-09 MED ORDER — DEXTROSE 50 % IV SOLN
INTRAVENOUS | Status: AC
  Filled 2015-03-09: qty 50

## 2015-03-09 MED ORDER — INSULIN ASPART 100 UNIT/ML ~~LOC~~ SOLN: 0.0000 [IU] | Freq: Three times a day (TID) | SUBCUTANEOUS | Status: DC

## 2015-03-09 MED ORDER — LORAZEPAM 2 MG/ML IJ SOLN
0.5000 mg | Freq: Once | INTRAMUSCULAR | Status: AC
  Administered 2015-03-09: 0.5 mg via INTRAVENOUS

## 2015-03-09 MED ORDER — ALLOPURINOL 100 MG PO TABS
200.0000 mg | ORAL_TABLET | Freq: Every day | ORAL | Status: DC
  Administered 2015-03-10: 200 mg via ORAL
  Filled 2015-03-09: qty 2

## 2015-03-09 MED ORDER — ROPINIROLE HCL 1 MG PO TABS
2.0000 mg | ORAL_TABLET | Freq: Every day | ORAL | Status: DC
  Administered 2015-03-09: 2 mg via ORAL
  Filled 2015-03-09: qty 2

## 2015-03-09 MED ORDER — FAMOTIDINE 20 MG PO TABS
20.0000 mg | ORAL_TABLET | Freq: Every day | ORAL | Status: DC
  Administered 2015-03-10: 20 mg via ORAL
  Filled 2015-03-09: qty 1

## 2015-03-09 MED ORDER — INSULIN PUMP
Freq: Three times a day (TID) | SUBCUTANEOUS | Status: DC
  Administered 2015-03-09: 9.5 via SUBCUTANEOUS
  Administered 2015-03-10: 5 via SUBCUTANEOUS
  Administered 2015-03-10: 2 via SUBCUTANEOUS
  Administered 2015-03-10: 8 via SUBCUTANEOUS
  Filled 2015-03-09: qty 1

## 2015-03-09 MED ORDER — LORAZEPAM 2 MG/ML IJ SOLN
1.0000 mg | Freq: Once | INTRAMUSCULAR | Status: AC
  Administered 2015-03-09: 1 mg via INTRAVENOUS
  Filled 2015-03-09: qty 1

## 2015-03-09 MED ORDER — FUROSEMIDE 80 MG PO TABS
80.0000 mg | ORAL_TABLET | Freq: Two times a day (BID) | ORAL | Status: DC
  Administered 2015-03-09 (×2): 80 mg via ORAL
  Filled 2015-03-09: qty 1
  Filled 2015-03-09: qty 4

## 2015-03-09 MED ORDER — CALCIUM ACETATE (PHOS BINDER) 667 MG PO CAPS
1334.0000 mg | ORAL_CAPSULE | Freq: Three times a day (TID) | ORAL | Status: DC
  Administered 2015-03-09 – 2015-03-10 (×3): 1334 mg via ORAL
  Filled 2015-03-09 (×3): qty 2

## 2015-03-09 MED ORDER — DOCUSATE SODIUM 100 MG PO CAPS
200.0000 mg | ORAL_CAPSULE | Freq: Three times a day (TID) | ORAL | Status: DC
  Administered 2015-03-09 – 2015-03-10 (×2): 200 mg via ORAL
  Filled 2015-03-09 (×3): qty 2

## 2015-03-09 MED ORDER — INSULIN ASPART 100 UNIT/ML ~~LOC~~ SOLN: 0.0000 [IU] | SUBCUTANEOUS | Status: DC

## 2015-03-09 MED ORDER — INSULIN PUMP
SUBCUTANEOUS | Status: DC
  Filled 2015-03-09: qty 1

## 2015-03-09 MED ORDER — CALCIUM ACETATE 667 MG PO CAPS: 667.0000 mg | ORAL_CAPSULE | ORAL | Status: DC

## 2015-03-09 MED ORDER — FLUTICASONE PROPIONATE 50 MCG/ACT NA SUSP
2.0000 | Freq: Every day | NASAL | Status: DC
  Filled 2015-03-09: qty 16

## 2015-03-09 MED ORDER — ACETAMINOPHEN 325 MG PO TABS
650.0000 mg | ORAL_TABLET | Freq: Four times a day (QID) | ORAL | Status: DC | PRN
  Administered 2015-03-09: 650 mg via ORAL

## 2015-03-09 MED ORDER — DIPHENHYDRAMINE HCL 25 MG PO CAPS: 25.0000 mg | ORAL_CAPSULE | Freq: Four times a day (QID) | ORAL | Status: DC | PRN

## 2015-03-09 MED ORDER — HYDROMORPHONE HCL 2 MG PO TABS
2.0000 mg | ORAL_TABLET | Freq: Three times a day (TID) | ORAL | Status: DC | PRN
  Filled 2015-03-09: qty 1

## 2015-03-09 MED ORDER — DEXTROSE 50 % IV SOLN
INTRAVENOUS | Status: AC
  Administered 2015-03-09: 50 mL
  Filled 2015-03-09: qty 50

## 2015-03-09 MED ORDER — RENA-VITE PO TABS
1.0000 | ORAL_TABLET | Freq: Every day | ORAL | Status: DC
  Administered 2015-03-10: 1 via ORAL
  Filled 2015-03-09: qty 1

## 2015-03-09 MED ORDER — GLUCAGON HCL RDNA (DIAGNOSTIC) 1 MG IJ SOLR: 1.0000 mg | Freq: Every day | INTRAMUSCULAR | Status: DC | PRN

## 2015-03-09 MED ORDER — NITROGLYCERIN IN D5W 200-5 MCG/ML-% IV SOLN
0.0000 ug/min | Freq: Once | INTRAVENOUS | Status: AC
  Administered 2015-03-09: 5 ug/min via INTRAVENOUS
  Filled 2015-03-09: qty 250

## 2015-03-09 MED ORDER — DEXTROSE 50 % IV SOLN
1.0000 | Freq: Once | INTRAVENOUS | Status: AC
  Administered 2015-03-09: 50 mL via INTRAVENOUS

## 2015-03-09 MED ORDER — FENTANYL CITRATE (PF) 100 MCG/2ML IJ SOLN
50.0000 ug | Freq: Once | INTRAMUSCULAR | Status: AC
  Administered 2015-03-09: 50 ug via INTRAVENOUS
  Filled 2015-03-09: qty 2

## 2015-03-09 MED ORDER — HEPARIN SODIUM (PORCINE) 5000 UNIT/ML IJ SOLN
5000.0000 [IU] | Freq: Three times a day (TID) | INTRAMUSCULAR | Status: DC
  Administered 2015-03-09 – 2015-03-10 (×2): 5000 [IU] via SUBCUTANEOUS
  Filled 2015-03-09 (×3): qty 1

## 2015-03-09 MED ORDER — HYDROCORTISONE 2.5 % EX CREA: 1.0000 "application " | TOPICAL_CREAM | Freq: Four times a day (QID) | CUTANEOUS | Status: DC | PRN

## 2015-03-09 MED ORDER — NITROGLYCERIN 0.4 MG SL SUBL: 0.4000 mg | SUBLINGUAL_TABLET | SUBLINGUAL | Status: DC | PRN

## 2015-03-09 MED ORDER — PROMETHAZINE HCL 25 MG PO TABS
25.0000 mg | ORAL_TABLET | Freq: Four times a day (QID) | ORAL | Status: DC | PRN
  Administered 2015-03-10: 25 mg via ORAL
  Filled 2015-03-09 (×2): qty 1

## 2015-03-09 MED ORDER — METOPROLOL SUCCINATE ER 50 MG PO TB24
50.0000 mg | ORAL_TABLET | Freq: Every day | ORAL | Status: DC
  Administered 2015-03-10: 50 mg via ORAL
  Filled 2015-03-09: qty 1

## 2015-03-09 MED ORDER — HYDROMORPHONE HCL 1 MG/ML IJ SOLN
0.5000 mg | INTRAMUSCULAR | Status: DC | PRN
  Administered 2015-03-09 – 2015-03-10 (×6): 0.5 mg via INTRAVENOUS
  Filled 2015-03-09 (×6): qty 1

## 2015-03-09 MED ORDER — HYDROCORTISONE 1 % EX CREA: TOPICAL_CREAM | Freq: Four times a day (QID) | CUTANEOUS | Status: DC | PRN

## 2015-03-09 MED ORDER — SEVELAMER CARBONATE 800 MG PO TABS
1600.0000 mg | ORAL_TABLET | Freq: Three times a day (TID) | ORAL | Status: DC
  Administered 2015-03-09 – 2015-03-10 (×3): 1600 mg via ORAL
  Filled 2015-03-09 (×3): qty 2

## 2015-03-09 MED ORDER — GLUCAGON (RDNA) 1 MG IJ KIT: 1.0000 mg | PACK | Freq: Every day | INTRAMUSCULAR | Status: DC | PRN

## 2015-03-09 MED ORDER — SODIUM CHLORIDE 0.9 % IJ SOLN
3.0000 mL | Freq: Two times a day (BID) | INTRAMUSCULAR | Status: DC
Start: 2015-03-09 — End: 2015-03-10
  Administered 2015-03-09: 3 mL via INTRAVENOUS

## 2015-03-09 MED ORDER — AMLODIPINE BESYLATE 10 MG PO TABS
10.0000 mg | ORAL_TABLET | Freq: Every day | ORAL | Status: DC
  Administered 2015-03-10: 10 mg via ORAL
  Filled 2015-03-09: qty 1

## 2015-03-09 MED ORDER — NITROGLYCERIN 2 % TD OINT
1.0000 [in_us] | TOPICAL_OINTMENT | Freq: Once | TRANSDERMAL | Status: AC
  Administered 2015-03-09: 1 [in_us] via TOPICAL
  Filled 2015-03-09: qty 1

## 2015-03-09 MED ORDER — CALCIUM ACETATE (PHOS BINDER) 667 MG PO CAPS
667.0000 mg | ORAL_CAPSULE | ORAL | Status: DC
  Administered 2015-03-09: 667 mg via ORAL
  Filled 2015-03-09: qty 1

## 2015-03-09 MED ORDER — HYDROMORPHONE HCL 1 MG/ML IJ SOLN
INTRAMUSCULAR | Status: AC
  Administered 2015-03-09: 15:00:00
  Filled 2015-03-09: qty 1

## 2015-03-09 MED ORDER — INSULIN ASPART 100 UNIT/ML ~~LOC~~ SOLN
10.0000 [IU] | Freq: Once | SUBCUTANEOUS | Status: AC
  Administered 2015-03-09: 10 [IU] via SUBCUTANEOUS
  Filled 2015-03-09: qty 1

## 2015-03-09 MED ORDER — ACETAMINOPHEN 325 MG PO TABS
ORAL_TABLET | ORAL | Status: AC
  Filled 2015-03-09: qty 2

## 2015-03-09 MED ORDER — DEXTROSE 50 % IV SOLN
1.0000 | Freq: Once | INTRAVENOUS | Status: AC
  Administered 2015-03-09: 50 mL via INTRAVENOUS
  Filled 2015-03-09: qty 50

## 2015-03-09 MED ORDER — FENTANYL CITRATE (PF) 100 MCG/2ML IJ SOLN
100.0000 ug | Freq: Once | INTRAMUSCULAR | Status: AC
  Administered 2015-03-09: 100 ug via INTRAVENOUS
  Filled 2015-03-09: qty 2

## 2015-03-09 MED ORDER — LORATADINE 10 MG PO TABS
10.0000 mg | ORAL_TABLET | Freq: Every day | ORAL | Status: DC
  Administered 2015-03-10: 10 mg via ORAL
  Filled 2015-03-09: qty 1

## 2015-03-09 MED ORDER — ATORVASTATIN CALCIUM 40 MG PO TABS
40.0000 mg | ORAL_TABLET | Freq: Every day | ORAL | Status: DC
  Administered 2015-03-09: 40 mg via ORAL
  Filled 2015-03-09: qty 1

## 2015-03-09 MED ORDER — PRASUGREL HCL 10 MG PO TABS
10.0000 mg | ORAL_TABLET | Freq: Every day | ORAL | Status: DC
  Administered 2015-03-10: 10 mg via ORAL
  Filled 2015-03-09: qty 1

## 2015-03-09 MED ORDER — ASPIRIN EC 81 MG PO TBEC
81.0000 mg | DELAYED_RELEASE_TABLET | Freq: Every day | ORAL | Status: DC
  Administered 2015-03-09: 81 mg via ORAL
  Filled 2015-03-09: qty 1

## 2015-03-09 NOTE — Progress Notes (Signed)
Arrived to perform HD. Patient does not have bed placement. Advised ED to call Hemodialysis once he is placed.

## 2015-03-09 NOTE — Progress Notes (Signed)
Inpatient Diabetes Program Recommendations  AACE/ADA: New Consensus Statement on Inpatient Glycemic Control (2015)  Target Ranges:  Prepandial:   less than 140 mg/dL      Peak postprandial:   less than 180 mg/dL (1-2 hours)      Critically ill patients:  140 - 180 mg/dL   Review of Glycemic Control  Diabetes history: dm times 41 yars (per patient) Outpatient Diabetes medications: Insulin pump Current orders for Inpatient glycemic control: Insulin pump  Inpatient Diabetes Program Recommendations: Spoke with Dr Landis Gandy regarding patient request to correct his cbg's and cover his carbohydrate intake with his insulin pump. Orders received to discontinue the sensitive correction scale and enter the insulin pump order set. Demonstrated to RN, Lavella Lemons how to document on the flowsheet and enter the bolus doses that patient gives himself. Settings are the same as last admission, except for insulin to cho ratio at 1 unit per 8 grams rather than per 7 grams.    Basal Rates: Midnight: 1.5 units/hr 3AM: 1.75 units/hr 8AM: 1.45 units/hr 12PM: 1.625 units/hr 4PM: 1.625 units/hr  Total Basal for 24 hour period= 38.55 units  Bolus setup: Carbohydrate Ratio= 1 unit for every 8 grams carbohydrates eaten by patient Correction/Sensitivity Factor= 1 unit for every 20 mg/dl above target CBG 120 mg/dl Target CBG= 120 mg/dl  Patient is agreeable and signed the contract to self-manage his insulin pump and to have his cbg's checked with hospital meter for documentation. Wife has brought his glucose meter and pump supplies now at bedside. Will follow patient. Glucose last in 300's-will tell patient to correct with his pump as this result has been within this hour.  Thank you Rosita Kea, RN, MSN, CDE  Diabetes Inpatient Program Office: [phone] Pager: [phone] 8:00 am to 5:00 pm

## 2015-03-09 NOTE — Progress Notes (Signed)
Patient Demographics  Dale [name], is a 49 y.o. male, DOB - [birth year], [id number]  Admit date - 03/09/2015   Admitting Physician Dale Morton, MD  Outpatient Primary MD for the patient is ASRES,ALEHEGN, MD  LOS - 0   Chief Complaint  Patient presents with  . Shortness of Breath         Subjective:   Dale [name] today has, No headache, No chest pain, No abdominal pain - No Nausea, No new weakness tingling or numbness, No Cough, dyspnea significantly improved.  Assessment & Plan    Principal Problem:   Fluid overload Active Problems:   S/P BKA (below knee amputation) (HCC)   Anemia   ESRD (end stage renal disease) on dialysis (HCC) M-W-F   HLD (hyperlipidemia)   Acute dyspnea   Pulmonary edema   Chronic pain   Hyperkalemia   Hypoglycemia   Diabetes mellitus (HCC)  Acute hypoxic respiratory failure secondary to pulmonary edema from volume overload - Secondary to increased fluid intake due to hypoglycemia as an outpatient - Nephrology consult greatly appreciated, and management with hemodialysis. - Continue with oxygen as needed - Initially requiring nitro drip   End-stage renal disease  - Nephrology consulted, on hemodialysis Monday Wednesday Friday   Hypertension  - Continue with home medication, continue with Lasix,   Diabetes mellitus with episode of hypoglycemia - Patient is on insulin pump, ondansetron of, what have diabetic coordinator evaluate insulin pump. - She'll episode of hypoglycemia in ED, monitor CBGs closely  Coronary artery disease - Continue with Effient, atorvastatin, denies any chest pain  Hyperkalemia - Measurement with hemodialysis  Anxiety - Continue with home medication  Chronic pain - Continue with home medication, will try to wean off IV Dilaudid    Code Status: Full code  Family Communication: None at bedside   Disposition  Plan: Pending further workup   Procedures  None   Consults   Nephrology   Medications  Scheduled Meds: . acetaminophen      . allopurinol  200 mg Oral Daily  . amLODipine  10 mg Oral Daily  . aspirin EC  81 mg Oral QHS  . atorvastatin  40 mg Oral QHS  . calcium acetate  1,334 mg Oral TID WC  . calcium acetate  667 mg Oral With snacks  . dextrose      . docusate sodium  200 mg Oral TID  . famotidine  20 mg Oral Daily  . fluticasone  2 spray Each Nare Daily  . furosemide  80 mg Oral BID  . heparin  5,000 Units Subcutaneous 3 times per day  . insulin aspart  0-9 Units Subcutaneous 6 times per day  . levothyroxine  100 mcg Oral QHS  . loratadine  10 mg Oral Daily  . metoCLOPramide  5 mg Oral TID PC & HS  . metoprolol succinate  50 mg Oral Daily  . multivitamin  1 tablet Oral Daily  . prasugrel  10 mg Oral Daily  . rOPINIRole  2 mg Oral Q supper  . sevelamer carbonate  1,600 mg Oral TID WC  . sodium chloride  3 mL Intravenous Q12H   Continuous Infusions:  PRN Meds:.acetaminophen, diphenhydrAMINE, glucagon (human recombinant), hydrocortisone cream, HYDROmorphone (  DILAUDID) injection, HYDROmorphone, nitroGLYCERIN, promethazine  DVT Prophylaxis   Heparin -  Lab Results  Component Value Date   PLT 197 03/09/2015    Antibiotics   Anti-infectives    None          Objective:   Filed Vitals:   03/09/15 1029 03/09/15 1100 03/09/15 1130 03/09/15 1200  BP: 151/79 137/70 148/75 127/65  Pulse: 78 82 82 84  Temp:      TempSrc:      Resp: 28 20 20 24   SpO2:        Wt Readings from Last 3 Encounters:  03/01/15 129.785 kg (286 lb 2 oz)  02/27/15 122.2 kg (269 lb 6.4 oz)  02/18/15 124.286 kg (274 lb)    No intake or output data in the 24 hours ending 03/09/15 1211   Physical Exam  Awake Alert, Oriented X 3,  Supple Neck,No JVD,  Symmetrical Chest wall movement, Good air movement bilaterally, RRR,No Gallops,Rubs or new Murmurs, No Parasternal Heave +ve  B.Sounds, Abd Soft, No tenderness, No organomegaly appriciated,  No Cyanosis, Clubbing or edema,left BKA  Data Review   Micro Results No results found for this or any previous visit (from the past 240 hour(s)).  Radiology Reports Dg Chest 2 View  03/01/2015  CLINICAL DATA:  Chest pain. EXAM: CHEST  2 VIEW COMPARISON:  02/24/2015 chest radiograph FINDINGS: Right internal jugular central venous catheter terminates in the lower third of the superior vena cava. Stable cardiomediastinal silhouette with mild cardiomegaly. No pneumothorax. No pleural effusion. Mild pulmonary edema. No focal lung consolidation. IMPRESSION: Stable mild cardiomegaly with mild pulmonary edema, most in keeping with mild congestive heart failure. Electronically Signed   By: Ilona Sorrel M.D.   On: 03/01/2015 20:01   Dg Chest 2 View  02/24/2015  CLINICAL DATA:  Chest pain with dizziness for 2 hours. Diabetes. Dialysis patient. EXAM: CHEST  2 VIEW COMPARISON:  01/21/2015 FINDINGS: Right central venous catheter with tip over the cavoatrial junction. No change in position since previous study. No pneumothorax. Mild cardiac enlargement and pulmonary vascular congestion demonstrating mild progression since previous study. No focal airspace disease or consolidation. No edema. Blunting of the costophrenic angle on the right suggesting small pleural effusion. Mediastinal contours appear intact. Mild degenerative changes in the spine. IMPRESSION: Cardiac enlargement with mild vascular congestion and small right pleural effusion. Electronically Signed   By: Lucienne Capers M.D.   On: 02/24/2015 22:33   Nm Myocar Multi W/spect W/wall Motion / Ef  02/26/2015   There was no ST segment deviation noted during stress.  Defect 1: There is a small defect of mild severity present in the mid anterior location.  The study is normal.  This is a low risk study.  The left ventricular ejection fraction is mildly decreased (45-54%).    Dg Chest  Portable 1 View  03/09/2015  CLINICAL DATA:  Shortness of breath all day, worsened when lying down. EXAM: PORTABLE CHEST 1 VIEW COMPARISON:  Frontal and lateral views 03/01/2015 FINDINGS: The tip of the right dialysis catheter in the mid SVC. Progressive pulmonary edema from prior exam. Cardiomegaly is grossly stable. Limited assessment of the lung bases due to soft tissue attenuation from body habitus, question small right pleural effusion. No confluent airspace disease. IMPRESSION: Progressive pulmonary edema, worsening CHF. Electronically Signed   By: Jeb Levering M.D.   On: 03/09/2015 01:20     CBC  Recent Labs Lab 03/09/15 0057  WBC 10.4  HGB 11.3*  HCT 36.5*  PLT 197  MCV 102.5*  MCH 31.7  MCHC 31.0  RDW 15.4  LYMPHSABS 1.7  MONOABS 0.5  EOSABS 0.4  BASOSABS 0.0    Chemistries   Recent Labs Lab 03/09/15 0057  NA 139  K 5.6*  CL 101  CO2 27  GLUCOSE 146*  BUN 67*  CREATININE 9.27*  CALCIUM 8.9  AST 18  ALT 14*  ALKPHOS 70  BILITOT 0.5   ------------------------------------------------------------------------------------------------------------------ estimated creatinine clearance is 12.7 mL/min (by C-G formula based on Cr of 9.27). ------------------------------------------------------------------------------------------------------------------ No results for input(s): HGBA1C in the last 72 hours. ------------------------------------------------------------------------------------------------------------------ No results for input(s): CHOL, HDL, LDLCALC, TRIG, CHOLHDL, LDLDIRECT in the last 72 hours. ------------------------------------------------------------------------------------------------------------------ No results for input(s): TSH, T4TOTAL, T3FREE, THYROIDAB in the last 72 hours.  Invalid input(s): FREET3 ------------------------------------------------------------------------------------------------------------------ No results for input(s):  VITAMINB12, FOLATE, FERRITIN, TIBC, IRON, RETICCTPCT in the last 72 hours.  Coagulation profile No results for input(s): INR, PROTIME in the last 168 hours.  No results for input(s): DDIMER in the last 72 hours.  Cardiac Enzymes  Recent Labs Lab 03/09/15 0057  TROPONINI 0.03   ------------------------------------------------------------------------------------------------------------------ Invalid input(s): POCBNP     Time Spent in minutes   No Sherlyn Lees, Lelon Ikard M.D on 03/09/2015 at 12:11 PM  Between 7am to 7pm - Pager - [phone]  After 7pm go to [email or web address] - password [facility]  [facility]   Office  [phone]

## 2015-03-09 NOTE — Progress Notes (Signed)
Second Attempt trying the BiPAP fail after another dose of ativan. Pt doesn't tolerate it well. Pt get extremely anxious, reaching for the mask. Pt refuses to wear it. MD notified of event. RN aware.

## 2015-03-09 NOTE — Progress Notes (Signed)
Utilization Review Completed.Donne Anon T11/14/2016

## 2015-03-09 NOTE — Progress Notes (Signed)
ED RN called to inform that there are no step down beds for Dale [name]. Bed placement was called by HD staff to confirm and also to see if there was anything that could be done to expedite the placement process. At this time bed placement has confirmed no beds. Dr Florene Glen is aware. Patient will be done first rounds 03/09/2015.

## 2015-03-09 NOTE — Consult Note (Signed)
[name] Renal Consultation Note    Indication for Consultation:  Management of ESRD/hemodialysis; anemia, hypertension/volume and secondary hyperparathyroidism PCP:  HPI: Dale [name] is a 49 y.o. male with ESRD on hemodialysis MWF at [facility]. PMH significant for hypertension, failed cadaveric renal transplant, chronic diastolic HF with EF [id number] grade 2 diastolic dysfunction per echo done 02/25/15 with mild mitral regurgitation, OSA (does not wear CPAP) Insulin dependent DM-type 1, uses insulin pump, anemia of chronic disease, secondary hyperparathyroidism, hypothyroidism, morbid obesity, CAD, pericardial effusion ( 08/2011), hyperlipidemia, SOB, claustrophobia, fibromyalgia.  L BKA with prosthetic leg. Patient has had many failed AV fistulas and AV grafts. Currently has RIJ Perm catheter and is schedule for placement of RUA AVF per Dr. Scot Dock in January 2017. NOTE: Patient has extensive list of medication allergies.   Patient states he had issues with his insulin pump and low blood sugars over weekend. As a result he says he consumed more fluid than he normally does. He states he went to sleep in recliner last night around 2000 and awoke around midnight extremely SOB and was brought to ED in personal vehicle per wife.  Upon arrival, chest xray showed progressive pulmonary edema/ questionable small R. Pleural effusion.. Bipap was attempted without success due to anxiety/claustrophobia issues.. His K+ was 5.6 and he was treated with insulin which further lowered his BS and he required D50w. He was started on NTG drip and nasal O2. Patient denies chest pain +C/O SOB, denies fevers, chills, nausea, diarrhea, abdominal pain, flank pain. States he was fine and in usual state of health until he started having issues with placement of his insulin pump.  Patient has hemodialysis at [facility] MWF. Is compliant with HD prescription. Has incenter lab values as follows:  HGB 12.0 (03/02/15) no ESA, Ca 8.9 C Ca 9.1 Phos 6..5 (02/18/15). Takes Renvela and Ca Acetate binders.   Past Medical History  Diagnosis Date  . Hypertension   . Anemia of chronic disease   . Cholecystitis     a. 08/27/2011  . Constipation   . Pericardial effusion     a.  Small by CT 08/22/11;  b.  Large by CT 08/27/11  . Inguinal lymphadenopathy     a. bilateral - s/p biopsy 07/2011  . Fibromyalgia   . Chronic diastolic CHF (congestive heart failure) ([place])     a. Echo 3/13:  EF 55-60%;  b. Echo (11/15):  Mild LVH, EF 60-65%, no RWMA, Gr 1 DD, MAC, mild LAE, normal RVF, mild RAE;  c.  Echo 5/16:  severe LVH, EF 55-60%, no RWMA, Gr 1 DD, MAC, mild LAE  . HLD (hyperlipidemia)   . Hypothyroidism   . GERD (gastroesophageal reflux disease)   . CAD (coronary artery disease)     a. Lexiscan Myoview (11/15):  Inf, apical cap and apical lateral ischemia, EF 56%, inf HK, Moderate Risk;  b. LHC (11/15):  mid to dist Dx 80%, mid RCA 99% (functional CTO), dist RCA 80% >> PCI: balloon angioplasty to mid RCA (could not deliver stent) - plan staged PCI with HSRA of RCA; Dx to be tx medically >> PCI: Rotoblator atherectomy/DES to [facility]  . OSA (obstructive sleep apnea)     adjustable bed  . Shortness of breath dyspnea     too much Fluid  . Pneumonia ~ 2007  . Insulin dependent diabetes mellitus (HCC)     Type I  . Claustrophobia     when things get around  his face.   [name] Kitchen ESRD (end stage renal disease) (Dale [name])     a. 1995 s/p cadaveric transplant w/ susbequent failure after 18 yrs;  b.Dialysis initiated 07/2011 [facility] M-W-F  . Constipation   . Arthritis     "hands, right knee" (03/19/2014)  . History of blood transfusion    Past Surgical History  Procedure Laterality Date  . Kidney transplant  04/25/1993  . Cataract extraction w/ intraocular lens  implant, bilateral Bilateral 1990's  . Arteriovenous graft placement Left 1993?    forearm  . Arteriovenous graft placement Right 1993?    leg  . Toe  amputation Bilateral after 2005  . Below knee leg amputation Left 2010  . Carpal tunnel release Bilateral after 2005  . Shoulder arthroscopy Left ~ 2006    frozen  . Av fistula placement  07/29/2011    Procedure: ARTERIOVENOUS (AV) FISTULA CREATION;  Surgeon: Mal Misty, MD;  Location: [place];  Service: Vascular;  Laterality: Right;  Brachial cephalic  . Insertion of dialysis catheter  08/01/2011    Procedure: INSERTION OF DIALYSIS CATHETER;  Surgeon: Rosetta Posner, MD;  Location: [place];  Service: Vascular;  Laterality: Right;  insertion of dialysis catheter on right internal jugular vein  . Lymph node biopsy  08/10/2011    Procedure: LYMPH NODE BIOPSY;  Surgeon: Merrie Roof, MD;  Location: [place];  Service: General;  Laterality: Left;  left groin lymph biopsy  . Colonoscopy  08/29/2011    Procedure: COLONOSCOPY;  Surgeon: Jerene Bears, MD;  Location: [place];  Service: Gastroenterology;  Laterality: N/A;  . Appendectomy  ~ 2006  . Below knee leg amputation Left   . Umbilical hernia repair  1990's  . Cardiac catheterization  03/19/2014  . Av graft placement Left 1993?    "attempted one in my wrist; didn't take"  . Neuroplasty / transposition ulnar nerve at elbow Left after 2005  . Venogram Right 07/28/2011    Procedure: VENOGRAM;  Surgeon: Conrad [place], MD;  Location: [facility] CATH LAB;  Service: Cardiovascular;  Laterality: Right;  . Left heart catheterization with coronary angiogram N/A 03/19/2014    Procedure: LEFT HEART CATHETERIZATION WITH CORONARY ANGIOGRAM;  Surgeon: Burnell Blanks, MD;  Location: [facility] CATH LAB;  Service: Cardiovascular;  Laterality: N/A;  . Cardiac catheterization  03/19/2014    Procedure: CORONARY BALLOON ANGIOPLASTY;  Surgeon: Burnell Blanks, MD;  Location: [facility] CATH LAB;  Service: Cardiovascular;;  . Coronary angioplasty with stent placement  04/09/2014        ptca/des mid lad   . Angioplasty  04/09/2014    RCA   . Femoral artery repair  04/09/2014     ANGIOSEAL  . Percutaneous coronary rotoblator intervention (pci-r) N/A 04/09/2014    Procedure: PERCUTANEOUS CORONARY ROTOBLATOR INTERVENTION (PCI-R);  Surgeon: Burnell Blanks, MD;  Location: [facility] CATH LAB;  Service: Cardiovascular;  Laterality: N/A;  . Revison of arteriovenous fistula Right [id number]    Procedure: PLICATION / REVISION OF ARTERIOVENOUS FISTULA;  Surgeon: Angelia Mould, MD;  Location: [place];  Service: Vascular;  Laterality: Right;  . Peripheral vascular catheterization N/A 12/25/2014    Procedure: Upper Extremity Venography;  Surgeon: Conrad Eden, MD;  Location: [place] CV LAB;  Service: Cardiovascular;  Laterality: N/A;  . Carpal tunnel release Bilateral   . Eye surgery Bilateral     cataract  . Amputation of replicated toes Right   . Vitrectomy Bilateral   . Peritoneal  catheter insertion    . Peritoneal catheter removal    . Arteriovenous graft placement Left     Thigh  . Arteriovenous graft removed Left     Thigh  . Av fistula placement Left 01/20/2015    Procedure: LIGATION OF RIGHT ARTERIOVENOUS FISTULA WITH EXCISION OF ANEURYSM;  Surgeon: Angelia Mould, MD;  Location: [place];  Service: Vascular;  Laterality: Left;  . Insertion of dialysis catheter Right 01/20/2015    Procedure: INSERTION OF DIALYSIS CATHETER - RIGHT INTERNAL JUGULAR ;  Surgeon: Angelia Mould, MD;  Location: [facility] OR;  Service: Vascular;  Laterality: Right;   Family History  Problem Relation Age of Onset  . Malignant hyperthermia Neg Hx   . Heart attack Neg Hx   . Stroke Neg Hx   . Colon polyps Father   . Diabetes Father   . Hypertension Father   . Breast cancer Maternal Aunt   . Cancer Maternal Aunt     Breast and Bone  . Hypertension Mother   . Diabetes Mother   . Hyperlipidemia Mother    Social History:  reports that he has never smoked. He has never used smokeless tobacco. He reports that he drinks alcohol. He reports that he does not use illicit  drugs. Allergies  Allergen Reactions  . Contrast Media [Iodinated Diagnostic Agents] Rash  . Morphine And Related Hives and Nausea Only  . Adhesive [Tape] Other (See Comments)    Blisters on arm from adhesive tape at dialysis  . Amoxicillin Itching and Rash    .Has patient had a PCN reaction causing immediate rash, facial/tongue/throat swelling, SOB or lightheadedness with hypotension: Yes Has patient had a PCN reaction causing severe rash involving mucus membranes or skin necrosis: No Has patient had a PCN reaction that required hospitalization No Has patient had a PCN reaction occurring within the last 10 years: No If all of the above answers are "NO", then may proceed with Cephalosporin use.  . Ciprofloxacin Itching and Rash  . Clindamycin/Lincomycin Hives and Itching  . Codeine Hives and Itching  . Doxycycline Rash  . Hydrocodone Rash  . Levofloxacin Hives and Itching  . Oxycodone Rash  . Plavix [Clopidogrel Bisulfate] Rash  . Rocephin [Ceftriaxone] Itching  . Vasotec Cough   Prior to Admission medications   Medication Sig Start Date End Date Taking? Authorizing Provider  allopurinol (ZYLOPRIM) 100 MG tablet Take 200 mg by mouth daily.  03/14/12  Yes Historical Provider, MD  amLODipine (NORVASC) 10 MG tablet Take 1 tablet (10 mg total) by mouth daily. 02/27/15  Yes Jonetta Osgood, MD  aspirin EC 81 MG tablet Take 81 mg by mouth at bedtime.   Yes Historical Provider, MD  atorvastatin (LIPITOR) 40 MG tablet Take 40 mg by mouth at bedtime. 02/13/15  Yes Historical Provider, MD  calcium acetate (PHOSLO) 667 MG capsule Take 667-1,334 mg by mouth See admin instructions. Take 2 capsules (1334 mg) by mouth with meals and 1 capsule (667 mg) with snacks 09/19/12  Yes Historical Provider, MD  diphenhydrAMINE (BENADRYL) 25 mg capsule Take 1-2 capsules (25-50 mg total) by mouth every 6 (six) hours as needed for itching. Patient taking differently: Take 25-50 mg by mouth every 6 (six) hours as  needed for itching or sleep.  04/11/14  Yes Rhonda G Barrett, PA-C  docusate sodium (COLACE) 100 MG capsule Take 200 mg by mouth 3 (three) times daily.    Yes Historical Provider, MD  EFFIENT 10 MG TABS tablet TAKE  1 TABLET BY MOUTH EVERY DAY 10/08/14  Yes Burnell Blanks, MD  fenofibrate (TRICOR) 48 MG tablet Take 1 tablet (48 mg total) by mouth daily. 02/27/15  Yes Shanker Kristeen Mans, MD  fluticasone (FLONASE) 50 MCG/ACT nasal spray Place 2 sprays into both nostrils daily.   Yes Historical Provider, MD  furosemide (LASIX) 80 MG tablet Take 80 mg by mouth 2 (two) times daily.  11/25/14  Yes Historical Provider, MD  GLUCAGON EMERGENCY 1 MG injection Inject 1 mg into the muscle daily as needed (low blood sugar).  01/19/12  Yes Historical Provider, MD  hydrocortisone 2.5 % cream Apply 1 application topically 4 (four) times daily as needed (itching).   Yes Historical Provider, MD  HYDROmorphone (DILAUDID) 2 MG tablet Take 1 tablet (2 mg total) by mouth 3 (three) times daily as needed for severe pain. Patient taking differently: Take 2 mg by mouth See admin instructions. Take 1 tablet (2 mg) by mouth daily at bedtime, may take an additional dose during the day as needed for severe pain 11/20/14  Yes Alvia Grove, PA-C  Insulin Human (INSULIN PUMP) 100 unit/ml SOLN Inject into the skin continuous. Humalog   Yes Historical Provider, MD  levothyroxine (SYNTHROID, LEVOTHROID) 100 MCG tablet Take 100 mcg by mouth at bedtime.    Yes Historical Provider, MD  loratadine (CLARITIN) 10 MG tablet Take 10 mg by mouth daily.   Yes Historical Provider, MD  metoCLOPramide (REGLAN) 5 MG tablet Take 5 mg by mouth 4 (four) times daily - after meals and at bedtime. 05/26/11  Yes Historical Provider, MD  metoprolol succinate (TOPROL-XL) 50 MG 24 hr tablet Take 1 tablet (50 mg total) by mouth daily. Take with or immediately following a meal. 02/27/15  Yes Shanker Kristeen Mans, MD  multivitamin (RENA-VIT) TABS tablet Take 1  tablet by mouth daily.   Yes Historical Provider, MD  nitroGLYCERIN (NITROSTAT) 0.4 MG SL tablet Place 0.4 mg under the tongue every 5 (five) minutes as needed for chest pain.    Yes [name], MD  promethazine (PHENERGAN) 25 MG tablet Take 1 tablet (25 mg total) by mouth every 6 (six) hours as needed for nausea or vomiting. For nausea 11/27/14  Yes Liliane Shi, PA-C  ranitidine (ZANTAC) 150 MG tablet Take 150 mg by mouth 2 (two) times daily. 11/01/14  Yes Historical Provider, MD  rOPINIRole (REQUIP) 2 MG tablet Take 2 mg by mouth daily with supper.    Yes Historical Provider, MD  sevelamer carbonate (RENVELA) 800 MG tablet Take 1,600 mg by mouth 3 (three) times daily with meals.   Yes Historical Provider, MD   Current Facility-Administered Medications  Medication Dose Route Frequency Provider Last Rate Last Dose  . allopurinol (ZYLOPRIM) tablet 200 mg  200 mg Oral Daily Rondell A Tamala Julian, MD      . amLODipine (NORVASC) tablet 10 mg  10 mg Oral Daily Norval Morton, MD      . aspirin EC tablet 81 mg  81 mg Oral QHS Rondell A Tamala Julian, MD      . atorvastatin (LIPITOR) tablet 40 mg  40 mg Oral QHS Rondell A Tamala Julian, MD      . calcium acetate (PHOSLO) capsule [id number] mg  [id number] mg Oral See admin instructions Norval Morton, MD      . dextrose 50 % solution           . diphenhydrAMINE (BENADRYL) capsule 25-50 mg  25-50 mg Oral Q6H PRN Rondell  Charmayne Sheer, MD      . docusate sodium (COLACE) capsule 200 mg  200 mg Oral TID Norval Morton, MD      . famotidine (PEPCID) tablet 20 mg  20 mg Oral Daily Rondell A Tamala Julian, MD      . fluticasone (FLONASE) 50 MCG/ACT nasal spray 2 spray  2 spray Each Nare Daily Rondell A Tamala Julian, MD      . furosemide (LASIX) tablet 80 mg  80 mg Oral BID Rondell A Tamala Julian, MD      . glucagon injection 1 mg  1 mg Intramuscular Daily PRN Norval Morton, MD      . heparin injection 5,000 Units  5,000 Units Subcutaneous 3 times per day Norval Morton, MD      . hydrocortisone 2.5  % cream 1 application  1 application Topical QID PRN Norval Morton, MD      . HYDROmorphone (DILAUDID) injection 0.5 mg  0.5 mg Intravenous Q4H PRN Norval Morton, MD   0.5 mg at 03/09/15 0538  . HYDROmorphone (DILAUDID) tablet 2 mg  2 mg Oral TID PRN Norval Morton, MD      . insulin aspart (novoLOG) injection 0-9 Units  0-9 Units Subcutaneous 6 times per day Norval Morton, MD      . insulin pump   Subcutaneous Continuous Rondell Charmayne Sheer, MD      . levothyroxine (SYNTHROID, LEVOTHROID) tablet 100 mcg  100 mcg Oral QHS Rondell A Tamala Julian, MD      . loratadine (CLARITIN) tablet 10 mg  10 mg Oral Daily Rondell A Tamala Julian, MD      . metoCLOPramide (REGLAN) tablet 5 mg  5 mg Oral TID PC & HS Rondell A Tamala Julian, MD      . metoprolol succinate (TOPROL-XL) 24 hr tablet 50 mg  50 mg Oral Daily Rondell A Tamala Julian, MD      . multivitamin (RENA-VIT) tablet 1 tablet  1 tablet Oral Daily Rondell A Tamala Julian, MD      . nitroGLYCERIN (NITROSTAT) SL tablet 0.4 mg  0.4 mg Sublingual Q5 min PRN Norval Morton, MD      . prasugrel (EFFIENT) tablet 10 mg  10 mg Oral Daily Rondell A Tamala Julian, MD      . promethazine (PHENERGAN) tablet 25 mg  25 mg Oral Q6H PRN Norval Morton, MD      . rOPINIRole (REQUIP) tablet 2 mg  2 mg Oral Q supper Rondell A Tamala Julian, MD      . sevelamer carbonate (RENVELA) tablet 1,600 mg  1,600 mg Oral TID WC Rondell A Smith, MD      . sodium chloride 0.9 % injection 3 mL  3 mL Intravenous Q12H Norval Morton, MD       Current Outpatient Prescriptions  Medication Sig Dispense Refill  . allopurinol (ZYLOPRIM) 100 MG tablet Take 200 mg by mouth daily.     [name] Kitchen amLODipine (NORVASC) 10 MG tablet Take 1 tablet (10 mg total) by mouth daily. 30 tablet 0  . aspirin EC 81 MG tablet Take 81 mg by mouth at bedtime.    [name] Kitchen atorvastatin (LIPITOR) 40 MG tablet Take 40 mg by mouth at bedtime.  0  . calcium acetate (PHOSLO) 667 MG capsule Take 667-1,334 mg by mouth See admin instructions. Take 2 capsules (1334 mg) by mouth  with meals and 1 capsule (667 mg) with snacks    . diphenhydrAMINE (BENADRYL) 25 mg capsule Take 1-2 capsules (  25-50 mg total) by mouth every 6 (six) hours as needed for itching. (Patient taking differently: Take 25-50 mg by mouth every 6 (six) hours as needed for itching or sleep. ) 30 capsule 0  . docusate sodium (COLACE) 100 MG capsule Take 200 mg by mouth 3 (three) times daily.     [name] Kitchen EFFIENT 10 MG TABS tablet TAKE 1 TABLET BY MOUTH EVERY DAY 30 tablet 0  . fenofibrate (TRICOR) 48 MG tablet Take 1 tablet (48 mg total) by mouth daily. 30 tablet 0  . fluticasone (FLONASE) 50 MCG/ACT nasal spray Place 2 sprays into both nostrils daily.    . furosemide (LASIX) 80 MG tablet Take 80 mg by mouth 2 (two) times daily.   98  . GLUCAGON EMERGENCY 1 MG injection Inject 1 mg into the muscle daily as needed (low blood sugar).     . hydrocortisone 2.5 % cream Apply 1 application topically 4 (four) times daily as needed (itching).    [name] Kitchen HYDROmorphone (DILAUDID) 2 MG tablet Take 1 tablet (2 mg total) by mouth 3 (three) times daily as needed for severe pain. (Patient taking differently: Take 2 mg by mouth See admin instructions. Take 1 tablet (2 mg) by mouth daily at bedtime, may take an additional dose during the day as needed for severe pain) 20 tablet 0  . Insulin Human (INSULIN PUMP) 100 unit/ml SOLN Inject into the skin continuous. Humalog    . levothyroxine (SYNTHROID, LEVOTHROID) 100 MCG tablet Take 100 mcg by mouth at bedtime.     [name] Kitchen loratadine (CLARITIN) 10 MG tablet Take 10 mg by mouth daily.    . metoCLOPramide (REGLAN) 5 MG tablet Take 5 mg by mouth 4 (four) times daily - after meals and at bedtime.    . metoprolol succinate (TOPROL-XL) 50 MG 24 hr tablet Take 1 tablet (50 mg total) by mouth daily. Take with or immediately following a meal. 30 tablet 0  . multivitamin (RENA-VIT) TABS tablet Take 1 tablet by mouth daily.    . nitroGLYCERIN (NITROSTAT) 0.4 MG SL tablet Place 0.4 mg under the tongue every 5  (five) minutes as needed for chest pain.     . promethazine (PHENERGAN) 25 MG tablet Take 1 tablet (25 mg total) by mouth every 6 (six) hours as needed for nausea or vomiting. For nausea 30 tablet 0  . ranitidine (ZANTAC) 150 MG tablet Take 150 mg by mouth 2 (two) times daily.  4  . rOPINIRole (REQUIP) 2 MG tablet Take 2 mg by mouth daily with supper.     . sevelamer carbonate (RENVELA) 800 MG tablet Take 1,600 mg by mouth 3 (three) times daily with meals.     Facility-Administered Medications Ordered in Other Encounters  Medication Dose Route Frequency Provider Last Rate Last Dose  . 0.9 %  sodium chloride infusion   Intravenous Continuous Angelia Mould, MD      . Chlorhexidine Gluconate Cloth 2 % PADS 6 each  6 each Topical Once Angelia Mould, MD       Labs: Basic Metabolic Panel:  Recent Labs Lab 03/09/15 0057  NA 139  K 5.6*  CL 101  CO2 27  GLUCOSE 146*  BUN 67*  CREATININE 9.27*  CALCIUM 8.9   Liver Function Tests:  Recent Labs Lab 03/09/15 0057  AST 18  ALT 14*  ALKPHOS 70  BILITOT 0.5  PROT 7.2  ALBUMIN 3.5   No results for input(s): LIPASE, AMYLASE in the last 168 hours. No  results for input(s): AMMONIA in the last 168 hours. CBC:  Recent Labs Lab 03/09/15 0057  WBC 10.4  NEUTROABS 7.8*  HGB 11.3*  HCT 36.5*  MCV 102.5*  PLT 197   Cardiac Enzymes:  Recent Labs Lab 03/09/15 0057  TROPONINI 0.03   CBG:  Recent Labs Lab 03/09/15 0515 03/09/15 0629 03/09/15 0739  GLUCAP 40* 51* 87   Iron Studies: No results for input(s): IRON, TIBC, TRANSFERRIN, FERRITIN in the last 72 hours. Studies/Results: Dg Chest Portable 1 View  03/09/2015  CLINICAL DATA:  Shortness of breath all day, worsened when lying down. EXAM: PORTABLE CHEST 1 VIEW COMPARISON:  Frontal and lateral views 03/01/2015 FINDINGS: The tip of the right dialysis catheter in the mid SVC. Progressive pulmonary edema from prior exam. Cardiomegaly is grossly stable. Limited  assessment of the lung bases due to soft tissue attenuation from body habitus, question small right pleural effusion. No confluent airspace disease. IMPRESSION: Progressive pulmonary edema, worsening CHF. Electronically Signed   By: Jeb Levering M.D.   On: 03/09/2015 01:20    ROS: As per HPI otherwise negative.  Physical Exam: Filed Vitals:   03/09/15 0515 03/09/15 0530 03/09/15 0630 03/09/15 0700  BP: 158/79 142/75 142/76 134/69  Pulse: 80 82 79 76  Temp:      TempSrc:      Resp: 22 24 40 19  SpO2: 100% 100% 99% 98%     General: Well developed, well nourished, in no acute distress. Head: Normocephalic, atraumatic, sclera non-icteric, mucus membranes are moist Neck: Supple. JVD slightly elevated at 30 degrees. . Lungs: Bilateral breath sounds decreased in bases with bibasilar crackles. Respirations shallow, without WOB present.  Heart: RRR with S1 S2. No murmurs, rubs, or gallops appreciated. SR on monitor Rate 70s.  Abdomen: Abdomen distended with upward displacement of diaphragm, active BS X 4 quadrants, nontender.  Upper and Lower extremities: Has L BKA.  Dependent edema in thighs, hands, R foot. Support hose RLE.  Neuro: Alert and oriented X 3. Moves all extremities spontaneously. Psych:  Responds to questions appropriately with a normal affect. Dialysis Access: R perm cath drsg CDI  Dialysis Orders: Center: [facility]  on MWF . EDW 122.5 HD Bath 2.0 K 2.25 Ca+   Time 4 hours Heparin 3700 units per treatment. Access R perm cath  BFR 400 DFR 800    Hectoral 2 mcg IV Q MWF (last dose 03/06/2015) Venofer 100 mg IV per treatment X 5 (Last dose 03/06/2015-needs 2 more doses  Assessment/Plan: 1.  Fluid overload/pulmonary edema: Will have hemodialysis as soon as possible. Attempt UFG 4 liters. Currently on NTG at 24 mcg/min, nasal O2. 2.  ESRD -  MWF SWGK-K+5.6. Will use 2.0 K bath 3.  Hypertension/volume  - BP controlled at present. Takes norvasc10 mg PO daily, metoprolol 50 mg XL daily  and furosemide 80 mg PO BID daily at home.  4.  Anemia  - HGB 11.3. Will follow CBC.  5.  Metabolic bone disease -  Ca 8.9 C Ca 9.3. Will continue binders per home med list.  6.  Nutrition - Renal/Carb mod diet. Monitor fld restrictions. 7.   DM: Per primary.   Rita H. Owens Shark, NP-C 03/09/2015, 8:47 AM  [facility] [phone]  Pt seen, examined, agree w assess/plan as above with additions as indicated. ESRD patient compliant with dialysis but not so much with fluid restriction.  Here for SOB and pulm edema w vol excess. Plan acute HD this am.  Leonor Liv  [facility] pager [phone]    cell [phone] 03/09/2015, 12:50 PM

## 2015-03-09 NOTE — Progress Notes (Signed)
Attempted BiPAP twice. Pt doesn't tolerate it well. Ativan was given prior to initiation to help relax pt. MD notified of event and pt status. Going to retry BiPAP after another dose of ativan per MD. RN aware. RT will continue to monitor pt.

## 2015-03-09 NOTE — ED Notes (Signed)
Attempted report 

## 2015-03-09 NOTE — Progress Notes (Signed)
Inpatient Diabetes Program Recommendations  AACE/ADA: New Consensus Statement on Inpatient Glycemic Control (2015)  Target Ranges:  Prepandial:   less than 140 mg/dL      Peak postprandial:   less than 180 mg/dL (1-2 hours)      Critically ill patients:  140 - 180 mg/dL   Review of Glycemic Control  Outpatient Diabetes medications: Insulin pump Current orders for Inpatient glycemic control: Insulin pump and sensitive correction q 4 hrs Pump settings at last hospitalization this month are the following:  Basal Rates: Midnight: 1.5 units/hr 3AM: 1.75 units/hr 8AM: 1.45 units/hr 12PM: 1.625 units/hr 4PM: 1.625 units/hr  Total Basal for 24 hour period= 38.55 units  Bolus settings:  Insulin to Carbohydrate Ratio= 1 unit for every 7 grams carbohydrates eaten by patient  Correction/Sensitivity Factor= 1 unit for every 20 mg/dl above target CBG 120 mg/dl  Target CBG= 120 mg/dl  Inpatient Diabetes Program Recommendations: Noted patient has insulin pump ordered as single order and sensitive correction q 4 hrs. If patient is using his pump for basal and bolus doses, recommend not to use correction scale in addition to pump delivery. Will see patient today and assess ability to use and operate his pump.  Thank you Rosita Kea, RN, MSN, CDE  Diabetes Inpatient Program Office: [phone] Pager: [phone] 8:00 am to 5:00 pm

## 2015-03-09 NOTE — ED Notes (Signed)
Pt arrives with c/o SOB onset all day, worsened tonight when he laid down to go to bed. Pt arrived and was having trouble speaking and forming sentences, RR labored with use of accessory muscles. Pt receives dialysis MWF, last dialyzed Friday. Pt also arrives with insulin pump to RLQ of abdomen.

## 2015-03-09 NOTE — ED Notes (Signed)
Dr. Tamala Julian notified of CBG, diet tray ordered per Dr. Tamala Julian

## 2015-03-09 NOTE — ED Notes (Signed)
Pt requested ice. Ok per [employer]. Pt given the same.

## 2015-03-09 NOTE — ED Notes (Signed)
Checked CBG 40. RN Nonie Hoyer informed.

## 2015-03-09 NOTE — ED Provider Notes (Signed)
CSN: [id number]   Arrival date & time 03/09/15 0041  History  By signing my name below, I, Altamease Oiler, attest that this documentation has been prepared under the direction and in the presence of Merryl Hacker, MD. Electronically Signed: Altamease Oiler, ED Scribe. 03/09/2015. 3:24 AM.  Chief Complaint  Patient presents with  . Shortness of Breath    HPI The history is provided by the patient and the spouse. No language interpreter was used.   Gabriel Rendina is a 49 y.o. male with history of ESRD on M/W/F hemodialysis, CHF, HTN, DM, HLD, and hypothyroidism who presents to the Emergency Department complaining of constant and severe SOB with gradual onset today. The SOB worsened tonight at bed time. He was last dialyzed 3 days ago and is due in the morning. Pt states that his blood sugar has been low today and he drank more fluids to bring it up. He has an insulin pump and his sugars are usually well controlled. Associated symptoms include increased LE swelling. Pt denies chest pain and cough. He has tried a CPAP machine to sleep in the past but denies ever having BIPAP in the hospital. Patient does not tolerate CPAP because of claustrophobia. Pt denies tobacco use.   Past Medical History  Diagnosis Date  . Hypertension   . Anemia of chronic disease   . Cholecystitis     a. 08/27/2011  . Constipation   . Pericardial effusion     a.  Small by CT 08/22/11;  b.  Large by CT 08/27/11  . Inguinal lymphadenopathy     a. bilateral - s/p biopsy 07/2011  . Fibromyalgia   . Chronic diastolic CHF (congestive heart failure) ([place])     a. Echo 3/13:  EF 55-60%;  b. Echo (11/15):  Mild LVH, EF 60-65%, no RWMA, Gr 1 DD, MAC, mild LAE, normal RVF, mild RAE;  c.  Echo 5/16:  severe LVH, EF 55-60%, no RWMA, Gr 1 DD, MAC, mild LAE  . HLD (hyperlipidemia)   . Hypothyroidism   . GERD (gastroesophageal reflux disease)   . CAD (coronary artery disease)     a. Lexiscan Myoview (11/15):  Inf, apical cap  and apical lateral ischemia, EF 56%, inf HK, Moderate Risk;  b. LHC (11/15):  mid to dist Dx 80%, mid RCA 99% (functional CTO), dist RCA 80% >> PCI: balloon angioplasty to mid RCA (could not deliver stent) - plan staged PCI with HSRA of RCA; Dx to be tx medically >> PCI: Rotoblator atherectomy/DES to [facility]  . OSA (obstructive sleep apnea)     adjustable bed  . Shortness of breath dyspnea     too much Fluid  . Pneumonia ~ 2007  . Insulin dependent diabetes mellitus (HCC)     Type I  . Claustrophobia     when things get around his face.   [name] Kitchen ESRD (end stage renal disease) ([place])     a. 1995 s/p cadaveric transplant w/ susbequent failure after 18 yrs;  b.Dialysis initiated 07/2011 [facility] M-W-F  . Constipation   . Arthritis     "hands, right knee" (03/19/2014)  . History of blood transfusion     Past Surgical History  Procedure Laterality Date  . Kidney transplant  04/25/1993  . Cataract extraction w/ intraocular lens  implant, bilateral Bilateral 1990's  . Arteriovenous graft placement Left 1993?    forearm  . Arteriovenous graft placement Right 1993?    leg  . Toe amputation Bilateral  after 2005  . Below knee leg amputation Left 2010  . Carpal tunnel release Bilateral after 2005  . Shoulder arthroscopy Left ~ 2006    frozen  . Av fistula placement  07/29/2011    Procedure: ARTERIOVENOUS (AV) FISTULA CREATION;  Surgeon: Mal Misty, MD;  Location: [place];  Service: Vascular;  Laterality: Right;  Brachial cephalic  . Insertion of dialysis catheter  08/01/2011    Procedure: INSERTION OF DIALYSIS CATHETER;  Surgeon: Rosetta Posner, MD;  Location: [place];  Service: Vascular;  Laterality: Right;  insertion of dialysis catheter on right internal jugular vein  . Lymph node biopsy  08/10/2011    Procedure: LYMPH NODE BIOPSY;  Surgeon: Merrie Roof, MD;  Location: [place];  Service: General;  Laterality: Left;  left groin lymph biopsy  . Colonoscopy  08/29/2011    Procedure: COLONOSCOPY;  Surgeon:  Jerene Bears, MD;  Location: [place];  Service: Gastroenterology;  Laterality: N/A;  . Appendectomy  ~ 2006  . Below knee leg amputation Left   . Umbilical hernia repair  1990's  . Cardiac catheterization  03/19/2014  . Av graft placement Left 1993?    "attempted one in my wrist; didn't take"  . Neuroplasty / transposition ulnar nerve at elbow Left after 2005  . Venogram Right 07/28/2011    Procedure: VENOGRAM;  Surgeon: Conrad Richwood, MD;  Location: [facility] CATH LAB;  Service: Cardiovascular;  Laterality: Right;  . Left heart catheterization with coronary angiogram N/A 03/19/2014    Procedure: LEFT HEART CATHETERIZATION WITH CORONARY ANGIOGRAM;  Surgeon: Burnell Blanks, MD;  Location: [facility] CATH LAB;  Service: Cardiovascular;  Laterality: N/A;  . Cardiac catheterization  03/19/2014    Procedure: CORONARY BALLOON ANGIOPLASTY;  Surgeon: Burnell Blanks, MD;  Location: [facility] CATH LAB;  Service: Cardiovascular;;  . Coronary angioplasty with stent placement  04/09/2014        ptca/des mid lad   . Angioplasty  04/09/2014    RCA   . Femoral artery repair  04/09/2014    ANGIOSEAL  . Percutaneous coronary rotoblator intervention (pci-r) N/A 04/09/2014    Procedure: PERCUTANEOUS CORONARY ROTOBLATOR INTERVENTION (PCI-R);  Surgeon: Burnell Blanks, MD;  Location: [facility] CATH LAB;  Service: Cardiovascular;  Laterality: N/A;  . Revison of arteriovenous fistula Right [id number]    Procedure: PLICATION / REVISION OF ARTERIOVENOUS FISTULA;  Surgeon: Angelia Mould, MD;  Location: [place];  Service: Vascular;  Laterality: Right;  . Peripheral vascular catheterization N/A 12/25/2014    Procedure: Upper Extremity Venography;  Surgeon: Conrad Gracey, MD;  Location: [place] CV LAB;  Service: Cardiovascular;  Laterality: N/A;  . Carpal tunnel release Bilateral   . Eye surgery Bilateral     cataract  . Amputation of replicated toes Right   . Vitrectomy Bilateral   . Peritoneal catheter  insertion    . Peritoneal catheter removal    . Arteriovenous graft placement Left     Thigh  . Arteriovenous graft removed Left     Thigh  . Av fistula placement Left 01/20/2015    Procedure: LIGATION OF RIGHT ARTERIOVENOUS FISTULA WITH EXCISION OF ANEURYSM;  Surgeon: Angelia Mould, MD;  Location: [place];  Service: Vascular;  Laterality: Left;  . Insertion of dialysis catheter Right 01/20/2015    Procedure: INSERTION OF DIALYSIS CATHETER - RIGHT INTERNAL JUGULAR ;  Surgeon: Angelia Mould, MD;  Location: [place];  Service: Vascular;  Laterality: Right;  Family History  Problem Relation Age of Onset  . Malignant hyperthermia Neg Hx   . Heart attack Neg Hx   . Stroke Neg Hx   . Colon polyps Father   . Diabetes Father   . Hypertension Father   . Breast cancer Maternal Aunt   . Cancer Maternal Aunt     Breast and Bone  . Hypertension Mother   . Diabetes Mother   . Hyperlipidemia Mother     Social History  Substance Use Topics  . Smoking status: Never Smoker   . Smokeless tobacco: Never Used  . Alcohol Use: Yes     Comment: 11/19/14 "might have a drink 2-3 times/year"     Review of Systems  Constitutional: Negative for fever.  Respiratory: Positive for shortness of breath. Negative for cough.   Cardiovascular: Positive for leg swelling. Negative for chest pain.  Gastrointestinal: Negative for nausea and vomiting.  Musculoskeletal: Positive for back pain.  All other systems reviewed and are negative.  Home Medications   Prior to Admission medications   Medication Sig Start Date End Date Taking? Authorizing Provider  allopurinol (ZYLOPRIM) 100 MG tablet Take 200 mg by mouth daily.  03/14/12  Yes Historical Provider, MD  amLODipine (NORVASC) 10 MG tablet Take 1 tablet (10 mg total) by mouth daily. 02/27/15  Yes Jonetta Osgood, MD  aspirin EC 81 MG tablet Take 81 mg by mouth at bedtime.   Yes Historical Provider, MD  atorvastatin (LIPITOR) 40 MG tablet Take 40 mg  by mouth at bedtime. 02/13/15  Yes Historical Provider, MD  calcium acetate (PHOSLO) 667 MG capsule Take 667-1,334 mg by mouth See admin instructions. Take 2 capsules (1334 mg) by mouth with meals and 1 capsule (667 mg) with snacks 09/19/12  Yes Historical Provider, MD  diphenhydrAMINE (BENADRYL) 25 mg capsule Take 1-2 capsules (25-50 mg total) by mouth every 6 (six) hours as needed for itching. Patient taking differently: Take 25-50 mg by mouth every 6 (six) hours as needed for itching or sleep.  04/11/14  Yes Rhonda G Barrett, PA-C  docusate sodium (COLACE) 100 MG capsule Take 200 mg by mouth 3 (three) times daily.    Yes Historical Provider, MD  EFFIENT 10 MG TABS tablet TAKE 1 TABLET BY MOUTH EVERY DAY 10/08/14  Yes Burnell Blanks, MD  fenofibrate (TRICOR) 48 MG tablet Take 1 tablet (48 mg total) by mouth daily. 02/27/15  Yes Shanker Kristeen Mans, MD  fluticasone (FLONASE) 50 MCG/ACT nasal spray Place 2 sprays into both nostrils daily.   Yes Historical Provider, MD  furosemide (LASIX) 80 MG tablet Take 80 mg by mouth 2 (two) times daily.  11/25/14  Yes Historical Provider, MD  GLUCAGON EMERGENCY 1 MG injection Inject 1 mg into the muscle daily as needed (low blood sugar).  01/19/12  Yes Historical Provider, MD  hydrocortisone 2.5 % cream Apply 1 application topically 4 (four) times daily as needed (itching).   Yes Historical Provider, MD  HYDROmorphone (DILAUDID) 2 MG tablet Take 1 tablet (2 mg total) by mouth 3 (three) times daily as needed for severe pain. Patient taking differently: Take 2 mg by mouth See admin instructions. Take 1 tablet (2 mg) by mouth daily at bedtime, may take an additional dose during the day as needed for severe pain 11/20/14  Yes Alvia Grove, PA-C  Insulin Human (INSULIN PUMP) 100 unit/ml SOLN Inject into the skin continuous. Humalog   Yes Historical Provider, MD  levothyroxine (SYNTHROID, LEVOTHROID) 100 MCG  tablet Take 100 mcg by mouth at bedtime.    Yes Historical  Provider, MD  loratadine (CLARITIN) 10 MG tablet Take 10 mg by mouth daily.   Yes Historical Provider, MD  metoCLOPramide (REGLAN) 5 MG tablet Take 5 mg by mouth 4 (four) times daily - after meals and at bedtime. 05/26/11  Yes Historical Provider, MD  metoprolol succinate (TOPROL-XL) 50 MG 24 hr tablet Take 1 tablet (50 mg total) by mouth daily. Take with or immediately following a meal. 02/27/15  Yes Shanker Kristeen Mans, MD  multivitamin (RENA-VIT) TABS tablet Take 1 tablet by mouth daily.   Yes Historical Provider, MD  nitroGLYCERIN (NITROSTAT) 0.4 MG SL tablet Place 0.4 mg under the tongue every 5 (five) minutes as needed for chest pain.    Yes [name], MD  promethazine (PHENERGAN) 25 MG tablet Take 1 tablet (25 mg total) by mouth every 6 (six) hours as needed for nausea or vomiting. For nausea 11/27/14  Yes Liliane Shi, PA-C  ranitidine (ZANTAC) 150 MG tablet Take 150 mg by mouth 2 (two) times daily. 11/01/14  Yes Historical Provider, MD  rOPINIRole (REQUIP) 2 MG tablet Take 2 mg by mouth daily with supper.    Yes Historical Provider, MD  sevelamer carbonate (RENVELA) 800 MG tablet Take 1,600 mg by mouth 3 (three) times daily with meals.   Yes Historical Provider, MD    Allergies  Contrast media; Morphine and related; Adhesive; Amoxicillin; Ciprofloxacin; Clindamycin/lincomycin; Codeine; Doxycycline; Hydrocodone; Levofloxacin; Oxycodone; Plavix; Rocephin; and Vasotec  Triage Vitals: BP 159/93 mmHg  Pulse 84  Temp(Src) 97.3 F (36.3 C) (Oral)  Resp 28  SpO2 100%  Physical Exam  Constitutional: He is oriented to person, place, and time.  Ill-appearing, tachypnea, tripoding, increased work of breathing  HENT:  Head: Normocephalic and atraumatic.  Cardiovascular: Normal rate, regular rhythm and normal heart sounds.   Pulmonary/Chest: He is in respiratory distress. He has no wheezes.  Increased work of breathing, tachypnea at times to the 40s, speaking in short sentences, accessory  muscle use  Abdominal: Soft. Bowel sounds are normal. There is no tenderness. There is no rebound.  Musculoskeletal: He exhibits edema.  Neurological: He is alert and oriented to person, place, and time.  Skin: Skin is warm and dry.  Psychiatric: He has a normal mood and affect.  Nursing note and vitals reviewed.   ED Course  Procedures  CRITICAL CARE Performed by: Merryl Hacker   Total critical care time: 45 minutes  Critical care time was exclusive of separately billable procedures and treating other patients.  Critical care was necessary to treat or prevent imminent or life-threatening deterioration.  Critical care was time spent personally by me on the following activities: development of treatment plan with patient and/or surrogate as well as nursing, discussions with consultants, evaluation of patient's response to treatment, examination of patient, obtaining history from patient or surrogate, ordering and performing treatments and interventions, ordering and review of laboratory studies, ordering and review of radiographic studies, pulse oximetry and re-evaluation of patient's condition.   DIAGNOSTIC STUDIES: Oxygen Saturation is 100% on 3L, adequate by my interpretation.    COORDINATION OF CARE: 12:50 AM Discussed treatment plan which includes lab work, CXR, EKG, BIPAP, and Ativan with pt at bedside and pt agreed to plan.  Labs Reviewed  CBC WITH DIFFERENTIAL/PLATELET - Abnormal; Notable for the following:    RBC 3.56 (*)    Hemoglobin 11.3 (*)    HCT 36.5 (*)  MCV 102.5 (*)    Neutro Abs 7.8 (*)    All other components within normal limits  COMPREHENSIVE METABOLIC PANEL - Abnormal; Notable for the following:    Potassium 5.6 (*)    Glucose, Bld 146 (*)    BUN 67 (*)    Creatinine, Ser 9.27 (*)    ALT 14 (*)    GFR calc non Af Amer 6 (*)    GFR calc Af Amer 7 (*)    All other components within normal limits  CBG MONITORING, ED - Abnormal; Notable for the  following:    Glucose-Capillary 40 (*)    All other components within normal limits  CBG MONITORING, ED - Abnormal; Notable for the following:    Glucose-Capillary 51 (*)    All other components within normal limits  TROPONIN I  CBG MONITORING, ED  CBG MONITORING, ED    Imaging Review Dg Chest Portable 1 View  03/09/2015  CLINICAL DATA:  Shortness of breath all day, worsened when lying down. EXAM: PORTABLE CHEST 1 VIEW COMPARISON:  Frontal and lateral views 03/01/2015 FINDINGS: The tip of the right dialysis catheter in the mid SVC. Progressive pulmonary edema from prior exam. Cardiomegaly is grossly stable. Limited assessment of the lung bases due to soft tissue attenuation from body habitus, question small right pleural effusion. No confluent airspace disease. IMPRESSION: Progressive pulmonary edema, worsening CHF. Electronically Signed   By: Jeb Levering M.D.   On: 03/09/2015 01:20    I personally reviewed and evaluated these images and lab results as a part of my medical decision-making.  ED ECG REPORT   Date: 03/09/2015  Rate: 81  Rhythm: normal sinus rhythm  QRS Axis: normal  Intervals: normal  ST/T Wave abnormalities: normal  Conduction Disutrbances:none  Narrative Interpretation:  PACs  I have personally reviewed the EKG tracing and agree with the computerized printout as noted.   MDM   Final diagnoses:  Acute pulmonary edema ([place])  ESRD (end stage renal disease) ([place])    Patient presents with shortness of breath. On dialysis. Reports increased fluid intake secondary to issues with his blood sugar. He is in mild respiratory distress with tachypnea. Patient placed on 3 L nasal cannula. Increased work of breathing. Discussed with patient a trial of BiPAP given my concerns for pulmonary edema and his work of breathing. Patient was given Ativan. BiPAP was tried twice and patient did not tolerate secondary to issues with claustrophobia. EKG is reassuring. Basic labwork  unremarkable. Patient chest x-ray shows pulmonary edema. Nitroglycerin drip was ordered. However, patient needs dialysis to remove volume. Discussed with on-call nephrology. They will arrange for urgent dialysis. Patient's potassium 5.6. Patient was given insulin and glucose.  Patient will be admitted to the stepdown unit.  I personally performed the services described in this documentation, which was scribed in my presence. The recorded information has been reviewed and is accurate.    Merryl Hacker, MD 03/09/15 [phone]

## 2015-03-09 NOTE — ED Notes (Signed)
Dr. Dina Rich at bedside, verbal order for Bipap. Maurice, respiratory tech at bedside.

## 2015-03-09 NOTE — Procedures (Signed)
  I was present at this dialysis session, have reviewed the session itself and made  appropriate changes Kelly Splinter MD [name] pager [phone]    cell [phone] 03/09/2015, 12:54 PM

## 2015-03-09 NOTE — H&P (Addendum)
[facility] History and Physical  Dale [name] [id number] DOB: [birth year] DOA: 03/09/2015  Referring physician: ED PCP: [name], MD   Chief Complaint: Shortness of breath  HPI:  Patient is a 49 year old male with past medical history significant for ESRD on HD M/W/F, failed cadaveric renal transplant, diabetes mellitus type 2 on insulin pump, anxiety, hypertension, chronic pain, and chronic use of medication; who presented with progressively worsening shortness of breath. Patient notes that he had been having difficulty with his insulin pump when he had it placed on his left side reporting recurrent episodes of hypoglycemia. Blood glucoses dropping as low as 40s and 50s repeatedly over the last 3 days. In attempts to raise his blood sugars he's had to consume more than he usually does. He tried switching the site, and felt that he was getting better control with his insulin pump. Reports that the insulin pump is currently on his right side and working better. At this time he is trying to [facility] endocrinologists from Dr. Iran Planas to another other endocrinologists.  However,  yesterday he noticed progressively worsening shortness of breath. Denies chest pain or cough.   Upon arrival to the emergency department it was seen that he had positive signs of congestion with fluid overload on x-ray. ED physician unable to get patient to tolerate BiPAP mask due to a history of claustrophobia and extremely anxious despite 2 attempts. Thereafter placed on 2 L of nasal cannula oxygen with an nitroglycerin drip running ease of breathing. Potassium was noted to be 5.6 which he was given insulin.  Review of Systems  Constitutional: Negative for chills and weight loss.  HENT: Positive for ear pain. Negative for ear discharge and nosebleeds.   Eyes: Negative for double vision and photophobia.  Respiratory: Positive for shortness of breath. Negative for hemoptysis.   Cardiovascular: Positive  for leg swelling. Negative for claudication.  Gastrointestinal: Negative for nausea, vomiting and abdominal pain.  Genitourinary: Negative for urgency, frequency and hematuria.  Musculoskeletal: Positive for myalgias and back pain.  Skin: Positive for itching and rash.  Neurological: Negative for sensory change and speech change.  Endo/Heme/Allergies: Negative for environmental allergies. Bruises/bleeds easily.  Psychiatric/Behavioral: The patient is nervous/anxious and has insomnia.       Past Medical History  Diagnosis Date  . Hypertension   . Anemia of chronic disease   . Cholecystitis     a. 08/27/2011  . Constipation   . Pericardial effusion     a.  Small by CT 08/22/11;  b.  Large by CT 08/27/11  . Inguinal lymphadenopathy     a. bilateral - s/p biopsy 07/2011  . Fibromyalgia   . Chronic diastolic CHF (congestive heart failure) ([place])     a. Echo 3/13:  EF 55-60%;  b. Echo (11/15):  Mild LVH, EF 60-65%, no RWMA, Gr 1 DD, MAC, mild LAE, normal RVF, mild RAE;  c.  Echo 5/16:  severe LVH, EF 55-60%, no RWMA, Gr 1 DD, MAC, mild LAE  . HLD (hyperlipidemia)   . Hypothyroidism   . GERD (gastroesophageal reflux disease)   . CAD (coronary artery disease)     a. Lexiscan Myoview (11/15):  Inf, apical cap and apical lateral ischemia, EF 56%, inf HK, Moderate Risk;  b. LHC (11/15):  mid to dist Dx 80%, mid RCA 99% (functional CTO), dist RCA 80% >> PCI: balloon angioplasty to mid RCA (could not deliver stent) - plan staged PCI with HSRA of RCA; Dx to be tx medically >>  PCI: Rotoblator atherectomy/DES to [facility]  . OSA (obstructive sleep apnea)     adjustable bed  . Shortness of breath dyspnea     too much Fluid  . Pneumonia ~ 2007  . Insulin dependent diabetes mellitus (HCC)     Type I  . Claustrophobia     when things get around his face.   [name] Kitchen ESRD (end stage renal disease) ([place])     a. 1995 s/p cadaveric transplant w/ susbequent failure after 18 yrs;  b.Dialysis initiated 07/2011 [facility]  M-W-F  . Constipation   . Arthritis     "hands, right knee" (03/19/2014)  . History of blood transfusion      Past Surgical History  Procedure Laterality Date  . Kidney transplant  04/25/1993  . Cataract extraction w/ intraocular lens  implant, bilateral Bilateral 1990's  . Arteriovenous graft placement Left 1993?    forearm  . Arteriovenous graft placement Right 1993?    leg  . Toe amputation Bilateral after 2005  . Below knee leg amputation Left 2010  . Carpal tunnel release Bilateral after 2005  . Shoulder arthroscopy Left ~ 2006    frozen  . Av fistula placement  07/29/2011    Procedure: ARTERIOVENOUS (AV) FISTULA CREATION;  Surgeon: Mal Misty, MD;  Location: [place];  Service: Vascular;  Laterality: Right;  Brachial cephalic  . Insertion of dialysis catheter  08/01/2011    Procedure: INSERTION OF DIALYSIS CATHETER;  Surgeon: Rosetta Posner, MD;  Location: [place];  Service: Vascular;  Laterality: Right;  insertion of dialysis catheter on right internal jugular vein  . Lymph node biopsy  08/10/2011    Procedure: LYMPH NODE BIOPSY;  Surgeon: Merrie Roof, MD;  Location: [place];  Service: General;  Laterality: Left;  left groin lymph biopsy  . Colonoscopy  08/29/2011    Procedure: COLONOSCOPY;  Surgeon: Jerene Bears, MD;  Location: [place];  Service: Gastroenterology;  Laterality: N/A;  . Appendectomy  ~ 2006  . Below knee leg amputation Left   . Umbilical hernia repair  1990's  . Cardiac catheterization  03/19/2014  . Av graft placement Left 1993?    "attempted one in my wrist; didn't take"  . Neuroplasty / transposition ulnar nerve at elbow Left after 2005  . Venogram Right 07/28/2011    Procedure: VENOGRAM;  Surgeon: Conrad Standard, MD;  Location: [facility] CATH LAB;  Service: Cardiovascular;  Laterality: Right;  . Left heart catheterization with coronary angiogram N/A 03/19/2014    Procedure: LEFT HEART CATHETERIZATION WITH CORONARY ANGIOGRAM;  Surgeon: Burnell Blanks, MD;   Location: [facility] CATH LAB;  Service: Cardiovascular;  Laterality: N/A;  . Cardiac catheterization  03/19/2014    Procedure: CORONARY BALLOON ANGIOPLASTY;  Surgeon: Burnell Blanks, MD;  Location: [facility] CATH LAB;  Service: Cardiovascular;;  . Coronary angioplasty with stent placement  04/09/2014        ptca/des mid lad   . Angioplasty  04/09/2014    RCA   . Femoral artery repair  04/09/2014    ANGIOSEAL  . Percutaneous coronary rotoblator intervention (pci-r) N/A 04/09/2014    Procedure: PERCUTANEOUS CORONARY ROTOBLATOR INTERVENTION (PCI-R);  Surgeon: Burnell Blanks, MD;  Location: [facility] CATH LAB;  Service: Cardiovascular;  Laterality: N/A;  . Revison of arteriovenous fistula Right [id number]    Procedure: PLICATION / REVISION OF ARTERIOVENOUS FISTULA;  Surgeon: Angelia Mould, MD;  Location: [place];  Service: Vascular;  Laterality: Right;  . Peripheral vascular  catheterization N/A 12/25/2014    Procedure: Upper Extremity Venography;  Surgeon: Conrad [place], MD;  Location: [place] CV LAB;  Service: Cardiovascular;  Laterality: N/A;  . Carpal tunnel release Bilateral   . Eye surgery Bilateral     cataract  . Amputation of replicated toes Right   . Vitrectomy Bilateral   . Peritoneal catheter insertion    . Peritoneal catheter removal    . Arteriovenous graft placement Left     Thigh  . Arteriovenous graft removed Left     Thigh  . Av fistula placement Left 01/20/2015    Procedure: LIGATION OF RIGHT ARTERIOVENOUS FISTULA WITH EXCISION OF ANEURYSM;  Surgeon: Angelia Mould, MD;  Location: [place];  Service: Vascular;  Laterality: Left;  . Insertion of dialysis catheter Right 01/20/2015    Procedure: INSERTION OF DIALYSIS CATHETER - RIGHT INTERNAL JUGULAR ;  Surgeon: Angelia Mould, MD;  Location: [place];  Service: Vascular;  Laterality: Right;      Social History:  reports that he has never smoked. He has never used smokeless tobacco. He reports that he drinks  alcohol. He reports that he does not use illicit drugs. where does patient live--home and with whom if at home? Wife Can patient participate in ADLs? Yes  Allergies  Allergen Reactions  . Contrast Media [Iodinated Diagnostic Agents] Rash  . Morphine And Related Hives and Nausea Only  . Adhesive [Tape] Other (See Comments)    Blisters on arm from adhesive tape at dialysis  . Amoxicillin Itching and Rash    .Has patient had a PCN reaction causing immediate rash, facial/tongue/throat swelling, SOB or lightheadedness with hypotension: Yes Has patient had a PCN reaction causing severe rash involving mucus membranes or skin necrosis: No Has patient had a PCN reaction that required hospitalization No Has patient had a PCN reaction occurring within the last 10 years: No If all of the above answers are "NO", then may proceed with Cephalosporin use.  . Ciprofloxacin Itching and Rash  . Clindamycin/Lincomycin Hives and Itching  . Codeine Hives and Itching  . Doxycycline Rash  . Hydrocodone Rash  . Levofloxacin Hives and Itching  . Oxycodone Rash  . Plavix [Clopidogrel Bisulfate] Rash  . Rocephin [Ceftriaxone] Itching  . Vasotec Cough    Family History  Problem Relation Age of Onset  . Malignant hyperthermia Neg Hx   . Heart attack Neg Hx   . Stroke Neg Hx   . Colon polyps Father   . Diabetes Father   . Hypertension Father   . Breast cancer Maternal Aunt   . Cancer Maternal Aunt     Breast and Bone  . Hypertension Mother   . Diabetes Mother   . Hyperlipidemia Mother        Prior to Admission medications   Medication Sig Start Date End Date Taking? Authorizing Provider  allopurinol (ZYLOPRIM) 100 MG tablet Take 200 mg by mouth daily.  03/14/12  Yes Historical Provider, MD  amLODipine (NORVASC) 10 MG tablet Take 1 tablet (10 mg total) by mouth daily. 02/27/15  Yes Jonetta Osgood, MD  aspirin EC 81 MG tablet Take 81 mg by mouth at bedtime.   Yes Historical Provider, MD   atorvastatin (LIPITOR) 40 MG tablet Take 40 mg by mouth at bedtime. 02/13/15  Yes Historical Provider, MD  calcium acetate (PHOSLO) 667 MG capsule Take 667-1,334 mg by mouth See admin instructions. Take 2 capsules (1334 mg) by mouth with meals and 1 capsule (667  mg) with snacks 09/19/12  Yes Historical Provider, MD  diphenhydrAMINE (BENADRYL) 25 mg capsule Take 1-2 capsules (25-50 mg total) by mouth every 6 (six) hours as needed for itching. Patient taking differently: Take 25-50 mg by mouth every 6 (six) hours as needed for itching or sleep.  04/11/14  Yes Rhonda G Barrett, PA-C  docusate sodium (COLACE) 100 MG capsule Take 200 mg by mouth 3 (three) times daily.    Yes Historical Provider, MD  EFFIENT 10 MG TABS tablet TAKE 1 TABLET BY MOUTH EVERY DAY 10/08/14  Yes Burnell Blanks, MD  fenofibrate (TRICOR) 48 MG tablet Take 1 tablet (48 mg total) by mouth daily. 02/27/15  Yes Shanker Kristeen Mans, MD  fluticasone (FLONASE) 50 MCG/ACT nasal spray Place 2 sprays into both nostrils daily.   Yes Historical Provider, MD  furosemide (LASIX) 80 MG tablet Take 80 mg by mouth 2 (two) times daily.  11/25/14  Yes Historical Provider, MD  GLUCAGON EMERGENCY 1 MG injection Inject 1 mg into the muscle daily as needed (low blood sugar).  01/19/12  Yes Historical Provider, MD  hydrocortisone 2.5 % cream Apply 1 application topically 4 (four) times daily as needed (itching).   Yes Historical Provider, MD  HYDROmorphone (DILAUDID) 2 MG tablet Take 1 tablet (2 mg total) by mouth 3 (three) times daily as needed for severe pain. Patient taking differently: Take 2 mg by mouth See admin instructions. Take 1 tablet (2 mg) by mouth daily at bedtime, may take an additional dose during the day as needed for severe pain 11/20/14  Yes Alvia Grove, PA-C  Insulin Human (INSULIN PUMP) 100 unit/ml SOLN Inject into the skin continuous. Humalog   Yes Historical Provider, MD  levothyroxine (SYNTHROID, LEVOTHROID) 100 MCG tablet Take  100 mcg by mouth at bedtime.    Yes Historical Provider, MD  loratadine (CLARITIN) 10 MG tablet Take 10 mg by mouth daily.   Yes Historical Provider, MD  metoCLOPramide (REGLAN) 5 MG tablet Take 5 mg by mouth 4 (four) times daily - after meals and at bedtime. 05/26/11  Yes Historical Provider, MD  metoprolol succinate (TOPROL-XL) 50 MG 24 hr tablet Take 1 tablet (50 mg total) by mouth daily. Take with or immediately following a meal. 02/27/15  Yes Shanker Kristeen Mans, MD  multivitamin (RENA-VIT) TABS tablet Take 1 tablet by mouth daily.   Yes Historical Provider, MD  nitroGLYCERIN (NITROSTAT) 0.4 MG SL tablet Place 0.4 mg under the tongue every 5 (five) minutes as needed for chest pain.    Yes [name], MD  promethazine (PHENERGAN) 25 MG tablet Take 1 tablet (25 mg total) by mouth every 6 (six) hours as needed for nausea or vomiting. For nausea 11/27/14  Yes Liliane Shi, PA-C  ranitidine (ZANTAC) 150 MG tablet Take 150 mg by mouth 2 (two) times daily. 11/01/14  Yes Historical Provider, MD  rOPINIRole (REQUIP) 2 MG tablet Take 2 mg by mouth daily with supper.    Yes Historical Provider, MD  sevelamer carbonate (RENVELA) 800 MG tablet Take 1,600 mg by mouth 3 (three) times daily with meals.   Yes Historical Provider, MD     Physical Exam: Filed Vitals:   03/09/15 0330 03/09/15 0345 03/09/15 0400 03/09/15 0415  BP: 152/85 145/74 139/76 148/77  Pulse: 78 78 78 81  Temp:      TempSrc:      Resp: 17 24 17 22   SpO2: 100% 100% 100% 100%     Constitutional: Vital  signs reviewed. Patient is a well-developed and well-nourished in no acute distress and cooperative with exam. Alert and oriented x3.  Head: Normocephalic and atraumatic  Ear: TM normal bilaterally  Mouth: no erythema or exudates, MMM  Eyes: PERRL, EOMI, conjunctivae normal, No scleral icterus.  Neck: Supple, Trachea midline normal ROM, No JVD, mass, thyromegaly, or carotid bruit present.  Cardiovascular: RRR, S1 normal, S2 normal,  no MRG, pulses symmetric and intact bilaterally  Pulmonary/Chest: positive crackles b/l , no wheezes, rales, or rhonchi  Abdominal: Protuberant abdomen Soft. Non-tender, non-distended, bowel sounds are normal, no masses, organomegaly, or guarding present.  GU: no CVA tenderness Musculoskeletal: No joint deformities, erythema, or stiffness, ROM full and no nontender Ext: +1-2 pitting edema of the right lower extremity. Below knee amputation of the left leg and no cyanosis, pulses palpable bilaterally (DP and PT)  Hematology: no cervical, inginal, or axillary adenopathy.  Neurological: A&O x3, Strenght is normal and symmetric bilaterally, cranial nerve II-XII are grossly intact, no focal motor deficit, sensory intact to light touch bilaterally.  Skin: Warm, dry and intact. No rash, cyanosis, or clubbing.  Psychiatric: Normal mood and affect. speech and behavior is normal. Judgment and thought content normal. Cognition and memory are normal.      Data Review   Micro Results No results found for this or any previous visit (from the past 240 hour(s)).  Radiology Reports Dg Chest 2 View  03/01/2015  CLINICAL DATA:  Chest pain. EXAM: CHEST  2 VIEW COMPARISON:  02/24/2015 chest radiograph FINDINGS: Right internal jugular central venous catheter terminates in the lower third of the superior vena cava. Stable cardiomediastinal silhouette with mild cardiomegaly. No pneumothorax. No pleural effusion. Mild pulmonary edema. No focal lung consolidation. IMPRESSION: Stable mild cardiomegaly with mild pulmonary edema, most in keeping with mild congestive heart failure. Electronically Signed   By: Ilona Sorrel M.D.   On: 03/01/2015 20:01   Dg Chest 2 View  02/24/2015  CLINICAL DATA:  Chest pain with dizziness for 2 hours. Diabetes. Dialysis patient. EXAM: CHEST  2 VIEW COMPARISON:  01/21/2015 FINDINGS: Right central venous catheter with tip over the cavoatrial junction. No change in position since previous  study. No pneumothorax. Mild cardiac enlargement and pulmonary vascular congestion demonstrating mild progression since previous study. No focal airspace disease or consolidation. No edema. Blunting of the costophrenic angle on the right suggesting small pleural effusion. Mediastinal contours appear intact. Mild degenerative changes in the spine. IMPRESSION: Cardiac enlargement with mild vascular congestion and small right pleural effusion. Electronically Signed   By: Lucienne Capers M.D.   On: 02/24/2015 22:33   Nm Myocar Multi W/spect W/wall Motion / Ef  02/26/2015   There was no ST segment deviation noted during stress.  Defect 1: There is a small defect of mild severity present in the mid anterior location.  The study is normal.  This is a low risk study.  The left ventricular ejection fraction is mildly decreased (45-54%).    Dg Chest Portable 1 View  03/09/2015  CLINICAL DATA:  Shortness of breath all day, worsened when lying down. EXAM: PORTABLE CHEST 1 VIEW COMPARISON:  Frontal and lateral views 03/01/2015 FINDINGS: The tip of the right dialysis catheter in the mid SVC. Progressive pulmonary edema from prior exam. Cardiomegaly is grossly stable. Limited assessment of the lung bases due to soft tissue attenuation from body habitus, question small right pleural effusion. No confluent airspace disease. IMPRESSION: Progressive pulmonary edema, worsening CHF. Electronically Signed   By:  Jeb Levering M.D.   On: 03/09/2015 01:20     CBC  Recent Labs Lab 03/09/15 0057  WBC 10.4  HGB 11.3*  HCT 36.5*  PLT 197  MCV 102.5*  MCH 31.7  MCHC 31.0  RDW 15.4  LYMPHSABS 1.7  MONOABS 0.5  EOSABS 0.4  BASOSABS 0.0    Chemistries   Recent Labs Lab 03/09/15 0057  NA 139  K 5.6*  CL 101  CO2 27  GLUCOSE 146*  BUN 67*  CREATININE 9.27*  CALCIUM 8.9  AST 18  ALT 14*  ALKPHOS 70  BILITOT 0.5    ------------------------------------------------------------------------------------------------------------------ estimated creatinine clearance is 12.7 mL/min (by C-G formula based on Cr of 9.27). ------------------------------------------------------------------------------------------------------------------ No results for input(s): HGBA1C in the last 72 hours. ------------------------------------------------------------------------------------------------------------------ No results for input(s): CHOL, HDL, LDLCALC, TRIG, CHOLHDL, LDLDIRECT in the last 72 hours. ------------------------------------------------------------------------------------------------------------------ No results for input(s): TSH, T4TOTAL, T3FREE, THYROIDAB in the last 72 hours.  Invalid input(s): FREET3 ------------------------------------------------------------------------------------------------------------------ No results for input(s): VITAMINB12, FOLATE, FERRITIN, TIBC, IRON, RETICCTPCT in the last 72 hours.  Coagulation profile No results for input(s): INR, PROTIME in the last 168 hours.  No results for input(s): DDIMER in the last 72 hours.  Cardiac Enzymes  Recent Labs Lab 03/09/15 0057  TROPONINI 0.03   ------------------------------------------------------------------------------------------------------------------ Invalid input(s): POCBNP   CBG: No results for input(s): GLUCAP in the last 168 hours.     EKG: Independently reviewed. Sinus rhythm with atrial complexes no T-wave changes.   Assessment/Plan Principal Problem:   Fluid overload Active Problems:   S/P BKA (below knee amputation) (HCC)   Anemia   ESRD (end stage renal disease) on dialysis (HCC) M-W-F   HLD (hyperlipidemia)   Acute dyspnea   Pulmonary edema   Chronic pain   Hyperkalemia  Fluid overload with pulmonary edema. Acute. Patient dialyzes Monday/ Wednesday /Friday but with recent changes in intake due to  hypoglycemia appears to be fluid overloaded chest x-ray showing pulmonary edema. Patient with complaints of acute shortness of breath unable to tolerate BiPAP. Patient on 2 L nasal cannula on physical exam tolerating nitro drip drip -Admit patient to stepdown unit -Neurology consulted in ED and will dialyze patient this a.m. -Continue nasal cannula oxygen with continuous pulse ox -Continue nitro drip until dialyzed  End-stage renal disease on dialysis: Chronic issue -See above -Continue renvela, phoslo  Diabetes mellitus with Recurrent Hypoglycemia: Prior to arrival patient reported hypoglycemic episodes until switching insulin pump sites. With epeat hypoglycemic episode it must be noted patient still had on insulin pump and was given 10 units of subcutaneous insulin for his symptoms of hyperkalemia with dextrose prior to bout seen in ED with a stick blood glucoses in the 40s -Hypoglycemic protocols q1 cbg hour -Insulin pump reported now to be turned off -Will need to reinstitute with caution placed on sliding scale insulin for now  Coronary artery disease  w/ CHF  -Continue Effient, atorvastatin  Hyperkalemia: Acute. Potassium on admisson 5.6 and patient was given 10 units of insulin. -Repeat BMP  Anemia: Chronic. Patient near his baseline secondary to chronic disease. -CBC repeat  Anxiety:Chronic Patient has history of social phobia -prn Ativan as needed  Hyperlipidemia: Stable -Continue atorvastatin  HTN: stable -continue metoprolol  Chronic pain: Patient with multiple pain complaints -Continue home dose of hydromorphone  Gerd: stable -Pepcid  Left BKA: Stable  *Fluid behind ears note patient was not able to be assessed regarding this issue, but states that he recently had tubes placed by ENT and  having problems*  Code Status:   full Family Communication: bedside Disposition Plan: admit   Total time spent 55 minutes.Greater than 50% of this time was spent in  counseling, explanation of diagnosis, planning of further management, and coordination of care  [facility] Pager [phone]  If 7PM-7AM, please contact night-coverage [email or web address] Password [facility] 03/09/2015, 4:37 AM

## 2015-03-10 DIAGNOSIS — G8929 Other chronic pain: Secondary | ICD-10-CM

## 2015-03-10 DIAGNOSIS — Z992 Dependence on renal dialysis: Secondary | ICD-10-CM

## 2015-03-10 DIAGNOSIS — N186 End stage renal disease: Secondary | ICD-10-CM

## 2015-03-10 DIAGNOSIS — E875 Hyperkalemia: Secondary | ICD-10-CM

## 2015-03-10 DIAGNOSIS — E108 Type 1 diabetes mellitus with unspecified complications: Secondary | ICD-10-CM

## 2015-03-10 LAB — RENAL FUNCTION PANEL
Albumin: 3.4 g/dL — ABNORMAL LOW (ref 3.5–5.0)
Anion gap: 10 (ref 5–15)
BUN: 41 mg/dL — ABNORMAL HIGH (ref 6–20)
CO2: 29 mmol/L (ref 22–32)
Calcium: 9 mg/dL (ref 8.9–10.3)
Chloride: 95 mmol/L — ABNORMAL LOW (ref 101–111)
Creatinine, Ser: 6.66 mg/dL — ABNORMAL HIGH (ref 0.61–1.24)
GFR calc Af Amer: 10 mL/min — ABNORMAL LOW (ref >60)
GFR calc non Af Amer: 9 mL/min — ABNORMAL LOW (ref >60)
Glucose, Bld: 189 mg/dL — ABNORMAL HIGH (ref 65–99)
Phosphorus: 6.6 mg/dL — ABNORMAL HIGH (ref 2.5–4.6)
Potassium: 5.3 mmol/L — ABNORMAL HIGH (ref 3.5–5.1)
Sodium: 134 mmol/L — ABNORMAL LOW (ref 135–145)

## 2015-03-10 LAB — CBC
HCT: 35.8 % — ABNORMAL LOW (ref 39.0–52.0)
Hemoglobin: 11 g/dL — ABNORMAL LOW (ref 13.0–17.0)
MCH: 31.7 pg (ref 26.0–34.0)
MCHC: 30.7 g/dL (ref 30.0–36.0)
MCV: 103.2 fL — ABNORMAL HIGH (ref 78.0–100.0)
Platelets: 176 10*3/uL (ref 150–400)
RBC: 3.47 MIL/uL — ABNORMAL LOW (ref 4.22–5.81)
RDW: 15.1 % (ref 11.5–15.5)
WBC: 7.5 10*3/uL (ref 4.0–10.5)

## 2015-03-10 LAB — GLUCOSE, CAPILLARY
Glucose-Capillary: 105 mg/dL — ABNORMAL HIGH (ref 65–99)
Glucose-Capillary: 148 mg/dL — ABNORMAL HIGH (ref 65–99)
Glucose-Capillary: 189 mg/dL — ABNORMAL HIGH (ref 65–99)
Glucose-Capillary: 86 mg/dL (ref 65–99)
Glucose-Capillary: 97 mg/dL (ref 65–99)
Glucose-Capillary: 152 mg/dL — ABNORMAL HIGH (ref 65–99)

## 2015-03-10 MED ORDER — HYDROMORPHONE HCL 1 MG/ML IJ SOLN
INTRAMUSCULAR | Status: AC
  Filled 2015-03-10: qty 1

## 2015-03-10 MED ORDER — HYDROMORPHONE HCL 1 MG/ML IJ SOLN: 0.5000 mg | INTRAMUSCULAR | Status: DC | PRN

## 2015-03-10 NOTE — Progress Notes (Signed)
Dialysis tx completed at 1118, 3.5 hours in length.  Goal of 4100, 3L ultrafiltrated.  Pt responded well to tx.  Disconnected from machine and RIJ hep locked.  Report called to Lavella Lemons, Therapist, sports.

## 2015-03-10 NOTE — Progress Notes (Signed)
Pt arrived to unit by bed at 0734.  A & O X 4, Lungs clear to auscultation.  Generalized edema.  R tunneled IJ accessed, heparin lock aspirated and port flushed with NS.  Tx initiated at 0748.  Goal of 4200 mL with net of 3L.  Will continue to monitor.

## 2015-03-10 NOTE — Discharge Summary (Signed)
Laquane Delaughter, is a 49 y.o. male  DOB 1965/10/30  MRN [id number].  Admission date:  03/09/2015  Admitting Physician  Norval Morton, MD  Discharge Date:  03/10/2015   Primary MD  ASRES,ALEHEGN, MD  Recommendations for primary care physician for things to follow:  - Continue hemodialysis as previously scheduled, patient to have hemodialysis tomorrow 11/16 as previously scheduled, to go back on MWF schedule.   Admission Diagnosis  Acute pulmonary edema (HCC) [J81.0] ESRD (end stage renal disease) ([place]) [N18.6]   Discharge Diagnosis  Acute pulmonary edema (HCC) [J81.0] ESRD (end stage renal disease) ([place]) [N18.6]    Principal Problem:   Fluid overload Active Problems:   S/P BKA (below knee amputation) (HCC)   Anemia   ESRD (end stage renal disease) on dialysis (HCC) M-W-F   HLD (hyperlipidemia)   Acute dyspnea   Pulmonary edema   Chronic pain   Hyperkalemia   Hypoglycemia   Diabetes mellitus ([place])      Past Medical History  Diagnosis Date  . Hypertension   . Anemia of chronic disease   . Cholecystitis     a. 08/27/2011  . Constipation   . Pericardial effusion     a.  Small by CT 08/22/11;  b.  Large by CT 08/27/11  . Inguinal lymphadenopathy     a. bilateral - s/p biopsy 07/2011  . Fibromyalgia   . Chronic diastolic CHF (congestive heart failure) ([place])     a. Echo 3/13:  EF 55-60%;  b. Echo (11/15):  Mild LVH, EF 60-65%, no RWMA, Gr 1 DD, MAC, mild LAE, normal RVF, mild RAE;  c.  Echo 5/16:  severe LVH, EF 55-60%, no RWMA, Gr 1 DD, MAC, mild LAE  . HLD (hyperlipidemia)   . Hypothyroidism   . GERD (gastroesophageal reflux disease)   . CAD (coronary artery disease)     a. Lexiscan Myoview (11/15):  Inf, apical cap and apical lateral ischemia, EF 56%, inf HK, Moderate Risk;  b. LHC (11/15):  mid to dist Dx 80%, mid RCA 99% (functional CTO), dist RCA 80% >> PCI: balloon angioplasty to mid  RCA (could not deliver stent) - plan staged PCI with HSRA of RCA; Dx to be tx medically >> PCI: Rotoblator atherectomy/DES to [facility]  . OSA (obstructive sleep apnea)     adjustable bed  . Shortness of breath dyspnea     too much Fluid  . Pneumonia ~ 2007  . Insulin dependent diabetes mellitus (HCC)     Type I  . Claustrophobia     when things get around his face.   [name] Kitchen ESRD (end stage renal disease) ([place])     a. 1995 s/p cadaveric transplant w/ susbequent failure after 18 yrs;  b.Dialysis initiated 07/2011 [facility] M-W-F  . Constipation   . Arthritis     "hands, right knee" (03/19/2014)  . History of blood transfusion     Past Surgical History  Procedure Laterality Date  . Kidney transplant  04/25/1993  .  Cataract extraction w/ intraocular lens  implant, bilateral Bilateral 1990's  . Arteriovenous graft placement Left 1993?    forearm  . Arteriovenous graft placement Right 1993?    leg  . Toe amputation Bilateral after 2005  . Below knee leg amputation Left 2010  . Carpal tunnel release Bilateral after 2005  . Shoulder arthroscopy Left ~ 2006    frozen  . Av fistula placement  07/29/2011    Procedure: ARTERIOVENOUS (AV) FISTULA CREATION;  Surgeon: Mal Misty, MD;  Location: [place];  Service: Vascular;  Laterality: Right;  Brachial cephalic  . Insertion of dialysis catheter  08/01/2011    Procedure: INSERTION OF DIALYSIS CATHETER;  Surgeon: Rosetta Posner, MD;  Location: [place];  Service: Vascular;  Laterality: Right;  insertion of dialysis catheter on right internal jugular vein  . Lymph node biopsy  08/10/2011    Procedure: LYMPH NODE BIOPSY;  Surgeon: Merrie Roof, MD;  Location: [place];  Service: General;  Laterality: Left;  left groin lymph biopsy  . Colonoscopy  08/29/2011    Procedure: COLONOSCOPY;  Surgeon: Jerene Bears, MD;  Location: [place];  Service: Gastroenterology;  Laterality: N/A;  . Appendectomy  ~ 2006  . Below knee leg amputation Left   . Umbilical hernia repair   1990's  . Cardiac catheterization  03/19/2014  . Av graft placement Left 1993?    "attempted one in my wrist; didn't take"  . Neuroplasty / transposition ulnar nerve at elbow Left after 2005  . Venogram Right 07/28/2011    Procedure: VENOGRAM;  Surgeon: Conrad Southampton, MD;  Location: [facility] CATH LAB;  Service: Cardiovascular;  Laterality: Right;  . Left heart catheterization with coronary angiogram N/A 03/19/2014    Procedure: LEFT HEART CATHETERIZATION WITH CORONARY ANGIOGRAM;  Surgeon: Burnell Blanks, MD;  Location: [facility] CATH LAB;  Service: Cardiovascular;  Laterality: N/A;  . Cardiac catheterization  03/19/2014    Procedure: CORONARY BALLOON ANGIOPLASTY;  Surgeon: Burnell Blanks, MD;  Location: [facility] CATH LAB;  Service: Cardiovascular;;  . Coronary angioplasty with stent placement  04/09/2014        ptca/des mid lad   . Angioplasty  04/09/2014    RCA   . Femoral artery repair  04/09/2014    ANGIOSEAL  . Percutaneous coronary rotoblator intervention (pci-r) N/A 04/09/2014    Procedure: PERCUTANEOUS CORONARY ROTOBLATOR INTERVENTION (PCI-R);  Surgeon: Burnell Blanks, MD;  Location: [facility] CATH LAB;  Service: Cardiovascular;  Laterality: N/A;  . Revison of arteriovenous fistula Right [id number]    Procedure: PLICATION / REVISION OF ARTERIOVENOUS FISTULA;  Surgeon: Angelia Mould, MD;  Location: [place];  Service: Vascular;  Laterality: Right;  . Peripheral vascular catheterization N/A 12/25/2014    Procedure: Upper Extremity Venography;  Surgeon: Conrad , MD;  Location: [place] CV LAB;  Service: Cardiovascular;  Laterality: N/A;  . Carpal tunnel release Bilateral   . Eye surgery Bilateral     cataract  . Amputation of replicated toes Right   . Vitrectomy Bilateral   . Peritoneal catheter insertion    . Peritoneal catheter removal    . Arteriovenous graft placement Left     Thigh  . Arteriovenous graft removed Left     Thigh  . Av fistula placement Left 01/20/2015     Procedure: LIGATION OF RIGHT ARTERIOVENOUS FISTULA WITH EXCISION OF ANEURYSM;  Surgeon: Angelia Mould, MD;  Location: [place];  Service: Vascular;  Laterality: Left;  . Insertion  of dialysis catheter Right 01/20/2015    Procedure: INSERTION OF DIALYSIS CATHETER - RIGHT INTERNAL JUGULAR ;  Surgeon: Angelia Mould, MD;  Location: [place];  Service: Vascular;  Laterality: Right;       History of present illness and  Hospital Course:     Kindly see H&P for history of present illness and admission details, please review complete Labs, Consult reports and Test reports for all details in brief  HPI  from the history and physical done on the day of admission  Patient is a 49 year old male with past medical history significant for ESRD on HD M/W/F, failed cadaveric renal transplant, diabetes mellitus type 2 on insulin pump, anxiety, hypertension, chronic pain, and chronic use of medication; who presented with progressively worsening shortness of breath. Patient notes that he had been having difficulty with his insulin pump when he had it placed on his left side reporting recurrent episodes of hypoglycemia. Blood glucoses dropping as low as 40s and 50s repeatedly over the last 3 days. In attempts to raise his blood sugars he's had to consume more than he usually does. He tried switching the site, and felt that he was getting better control with his insulin pump. Reports that the insulin pump is currently on his right side and working better. At this time he is trying to [facility] endocrinologists from Dr. Iran Planas to another other endocrinologists. However, yesterday he noticed progressively worsening shortness of breath. Denies chest pain or cough.   Upon arrival to the emergency department it was seen that he had positive signs of congestion with fluid overload on x-ray. ED physician unable to get patient to tolerate BiPAP mask due to a history of claustrophobia and extremely anxious despite  2 attempts. Thereafter placed on 2 L of nasal cannula oxygen with an nitroglycerin drip running ease of breathing. Potassium was noted to be 5.6 which he was given insulin.  Hospital Course   Acute hypoxic respiratory failure secondary to pulmonary edema from volume overload - Secondary to increased fluid intake due to hypoglycemia as an outpatient - Nephrology consult greatly appreciated, volume management with hemodialysis. -Received dialysis 2 days back-to-back 11/14 and 11/15 - Currently off oxygen, on room air  End-stage renal disease  - Nephrology consulted, on hemodialysis Monday Wednesday Friday   Hypertension  - Continue with home medication, continue with Lasix,   Diabetes mellitus with episode of hypoglycemia - Patient is on insulin pump, initial episode of hypoglycemia as received to [facility] for hyperkalemia, was on insulin pump during hospital stay over last 24 hours, CBG acceptable.   Coronary artery disease - Continue with Effient, atorvastatin, denies any chest pain  Hyperkalemia -Managed with hemodialysis  Anxiety - Continue with home medication  Chronic pain - Continue with home medication      Discharge Condition:  Stable   Follow UP  Follow-up Information    Follow up with ASRES,ALEHEGN, MD. Schedule an appointment as soon as possible for a visit in 1 week.   Specialty:  Family Medicine   Why:  Posthospitalization follow-up   Contact information:   [id number] [place] [address] [phone]         Discharge Instructions  and  Discharge Medications     Discharge Instructions    Discharge instructions    Complete by:  As directed   Follow with Primary MD ASRES,ALEHEGN, MD in 7 days   Get CBC, CMP checked  by Primary MD next visit.  Activity: As tolerated with Full fall precautions use walker/cane & assistance as needed   Disposition Home    Diet: Heart Healthy , renal modified with 1200 cc fluid  restriction , with feeding assistance and aspiration precautions.  For Heart failure patients - Check your Weight same time everyday, if you gain over 2 pounds, or you develop in leg swelling, experience more shortness of breath or chest pain, call your Primary MD immediately. Follow Cardiac Low Salt Diet and 1.5 lit/day fluid restriction.   On your next visit with your primary care physician please Get Medicines reviewed and adjusted.   Please request your Prim.MD to go over all Hospital Tests and Procedure/Radiological results at the follow up, please get all Hospital records sent to your Prim MD by signing hospital release before you go home.   If you experience worsening of your admission symptoms, develop shortness of breath, life threatening emergency, suicidal or homicidal thoughts you must seek medical attention immediately by calling 911 or calling your MD immediately  if symptoms less severe.  You Must read complete instructions/literature along with all the possible adverse reactions/side effects for all the Medicines you take and that have been prescribed to you. Take any new Medicines after you have completely understood and accpet all the possible adverse reactions/side effects.   Do not drive, operating heavy machinery, perform activities at heights, swimming or participation in water activities or provide baby sitting services if your were admitted for syncope or siezures until you have seen by Primary MD or a Neurologist and advised to do so again.  Do not drive when taking Pain medications.    Do not take more than prescribed Pain, Sleep and Anxiety Medications  Special Instructions: If you have smoked or chewed Tobacco  in the last 2 yrs please stop smoking, stop any regular Alcohol  and or any Recreational drug use.  Wear Seat belts while driving.   Please note  You were cared for by a hospitalist during your hospital stay. If you have any questions about your  discharge medications or the care you received while you were in the hospital after you are discharged, you can call the unit and asked to speak with the hospitalist on call if the hospitalist that took care of you is not available. Once you are discharged, your primary care physician will handle any further medical issues. Please note that NO REFILLS for any discharge medications will be authorized once you are discharged, as it is imperative that you return to your primary care physician (or establish a relationship with a primary care physician if you do not have one) for your aftercare needs so that they can reassess your need for medications and monitor your lab values.     Increase activity slowly    Complete by:  As directed             Medication List    TAKE these medications        allopurinol 100 MG tablet  Commonly known as:  ZYLOPRIM  Take 200 mg by mouth daily.     amLODipine 10 MG tablet  Commonly known as:  NORVASC  Take 1 tablet (10 mg total) by mouth daily.     aspirin EC 81 MG tablet  Take 81 mg by mouth at bedtime.     atorvastatin 40 MG tablet  Commonly known as:  LIPITOR  Take 40 mg by mouth at bedtime.     calcium acetate 667 MG  capsule  Commonly known as:  PHOSLO  Take 667-1,334 mg by mouth See admin instructions. Take 2 capsules (1334 mg) by mouth with meals and 1 capsule (667 mg) with snacks     diphenhydrAMINE 25 mg capsule  Commonly known as:  BENADRYL  Take 1-2 capsules (25-50 mg total) by mouth every 6 (six) hours as needed for itching.     docusate sodium 100 MG capsule  Commonly known as:  COLACE  Take 200 mg by mouth 3 (three) times daily.     EFFIENT 10 MG Tabs tablet  Generic drug:  prasugrel  TAKE 1 TABLET BY MOUTH EVERY DAY     fenofibrate 48 MG tablet  Commonly known as:  TRICOR  Take 1 tablet (48 mg total) by mouth daily.     fluticasone 50 MCG/ACT nasal spray  Commonly known as:  FLONASE  Place 2 sprays into both nostrils daily.      furosemide 80 MG tablet  Commonly known as:  LASIX  Take 80 mg by mouth 2 (two) times daily.     GLUCAGON EMERGENCY 1 MG injection  Generic drug:  glucagon  Inject 1 mg into the muscle daily as needed (low blood sugar).     hydrocortisone 2.5 % cream  Apply 1 application topically 4 (four) times daily as needed (itching).     HYDROmorphone 2 MG tablet  Commonly known as:  DILAUDID  Take 1 tablet (2 mg total) by mouth 3 (three) times daily as needed for severe pain.     insulin pump Soln  Inject into the skin continuous. Humalog     levothyroxine 100 MCG tablet  Commonly known as:  SYNTHROID, LEVOTHROID  Take 100 mcg by mouth at bedtime.     loratadine 10 MG tablet  Commonly known as:  CLARITIN  Take 10 mg by mouth daily.     metoCLOPramide 5 MG tablet  Commonly known as:  REGLAN  Take 5 mg by mouth 4 (four) times daily - after meals and at bedtime.     metoprolol succinate 50 MG 24 hr tablet  Commonly known as:  TOPROL-XL  Take 1 tablet (50 mg total) by mouth daily. Take with or immediately following a meal.     multivitamin Tabs tablet  Take 1 tablet by mouth daily.     nitroGLYCERIN 0.4 MG SL tablet  Commonly known as:  NITROSTAT  Place 0.4 mg under the tongue every 5 (five) minutes as needed for chest pain.     promethazine 25 MG tablet  Commonly known as:  PHENERGAN  Take 1 tablet (25 mg total) by mouth every 6 (six) hours as needed for nausea or vomiting. For nausea     ranitidine 150 MG tablet  Commonly known as:  ZANTAC  Take 150 mg by mouth 2 (two) times daily.     rOPINIRole 2 MG tablet  Commonly known as:  REQUIP  Take 2 mg by mouth daily with supper.     sevelamer carbonate 800 MG tablet  Commonly known as:  RENVELA  Take 1,600 mg by mouth 3 (three) times daily with meals.          Diet and Activity recommendation: See Discharge Instructions above   Consults obtained -  Renal  Major procedures and Radiology Reports - PLEASE review  detailed and final reports for all details, in brief -      Dg Chest 2 View  03/01/2015  CLINICAL DATA:  Chest pain. EXAM: CHEST  2 VIEW COMPARISON:  02/24/2015 chest radiograph FINDINGS: Right internal jugular central venous catheter terminates in the lower third of the superior vena cava. Stable cardiomediastinal silhouette with mild cardiomegaly. No pneumothorax. No pleural effusion. Mild pulmonary edema. No focal lung consolidation. IMPRESSION: Stable mild cardiomegaly with mild pulmonary edema, most in keeping with mild congestive heart failure. Electronically Signed   By: Ilona Sorrel M.D.   On: 03/01/2015 20:01   Dg Chest 2 View  02/24/2015  CLINICAL DATA:  Chest pain with dizziness for 2 hours. Diabetes. Dialysis patient. EXAM: CHEST  2 VIEW COMPARISON:  01/21/2015 FINDINGS: Right central venous catheter with tip over the cavoatrial junction. No change in position since previous study. No pneumothorax. Mild cardiac enlargement and pulmonary vascular congestion demonstrating mild progression since previous study. No focal airspace disease or consolidation. No edema. Blunting of the costophrenic angle on the right suggesting small pleural effusion. Mediastinal contours appear intact. Mild degenerative changes in the spine. IMPRESSION: Cardiac enlargement with mild vascular congestion and small right pleural effusion. Electronically Signed   By: Lucienne Capers M.D.   On: 02/24/2015 22:33   Nm Myocar Multi W/spect W/wall Motion / Ef  02/26/2015   There was no ST segment deviation noted during stress.  Defect 1: There is a small defect of mild severity present in the mid anterior location.  The study is normal.  This is a low risk study.  The left ventricular ejection fraction is mildly decreased (45-54%).    Dg Chest Portable 1 View  03/09/2015  CLINICAL DATA:  Shortness of breath all day, worsened when lying down. EXAM: PORTABLE CHEST 1 VIEW COMPARISON:  Frontal and lateral views  03/01/2015 FINDINGS: The tip of the right dialysis catheter in the mid SVC. Progressive pulmonary edema from prior exam. Cardiomegaly is grossly stable. Limited assessment of the lung bases due to soft tissue attenuation from body habitus, question small right pleural effusion. No confluent airspace disease. IMPRESSION: Progressive pulmonary edema, worsening CHF. Electronically Signed   By: Jeb Levering M.D.   On: 03/09/2015 01:20    Micro Results     Recent Results (from the past 240 hour(s))  MRSA PCR Screening     Status: None   Collection Time: 03/09/15  3:20 PM  Result Value Ref Range Status   MRSA by PCR NEGATIVE NEGATIVE Final    Comment:        The GeneXpert MRSA Assay (FDA approved for NASAL specimens only), is one component of a comprehensive MRSA colonization surveillance program. It is not intended to diagnose MRSA infection nor to guide or monitor treatment for MRSA infections.        Today   Subjective:   Kadeen Montaque today has no headache,no chest or  abdominal pain,  feels much better wants to go home today.   Objective:   Blood pressure 145/73, pulse 87, temperature 97.6 F (36.4 C), temperature source Oral, resp. rate 19, height 5\' 8"  (1.727 m), weight 125.2 kg (276 lb 0.3 oz), SpO2 100 %.   Intake/Output Summary (Last 24 hours) at 03/10/15 1355 Last data filed at 03/10/15 1228  Gross per 24 hour  Intake    320 ml  Output   7125 ml  Net  -6805 ml    Exam Awake Alert, Oriented x 3, No new F.N deficits, Normal affect [place].AT,PERRAL Supple Neck,No JVD, No cervical lymphadenopathy appriciated.  Symmetrical Chest wall movement, Good air movement bilaterally RRR,No Gallops,Rubs or new Murmurs, No Parasternal Heave +ve  B.Sounds, Abd Soft, Non tender. No Cyanosis, right lower extremity pedal edema, left BKA  Data Review   CBC w Diff: Lab Results  Component Value Date   WBC 7.5 03/10/2015   HGB 11.0* 03/10/2015   HCT 35.8* 03/10/2015   PLT  176 03/10/2015   LYMPHOPCT 17 03/09/2015   MONOPCT 5 03/09/2015   EOSPCT 4 03/09/2015   BASOPCT 0 03/09/2015    CMP: Lab Results  Component Value Date   NA 134* 03/10/2015   K 5.3* 03/10/2015   CL 95* 03/10/2015   CO2 29 03/10/2015   BUN 41* 03/10/2015   CREATININE 6.66* 03/10/2015   PROT 7.2 03/09/2015   ALBUMIN 3.4* 03/10/2015   BILITOT 0.5 03/09/2015   ALKPHOS 70 03/09/2015   AST 18 03/09/2015   ALT 14* 03/09/2015  .   Total Time in preparing paper work, data evaluation and todays exam - 35 minutes  Tranice Laduke M.D on 03/10/2015 at 1:55 PM  [facility]   Office  [phone]

## 2015-03-10 NOTE — Progress Notes (Signed)
Physician at bedside, verbal order received to shorten treatment time to 3.5 hrs.  Will continue to monitor.

## 2015-03-10 NOTE — Progress Notes (Signed)
Patient discharged by wheelchair with wife to home.  Discharge instructions given to patient and wife with all questions and concerns addressed and answered.  Patient ambulated in room without c/o pain or SOB.  Patient awake, alert and oriented at time of discharge.

## 2015-03-10 NOTE — Progress Notes (Signed)
  [facility] Progress Note   Subjective: alert no distress, 4kg off w HD yest  Filed Vitals:   03/10/15 0803 03/10/15 0819 03/10/15 0833 03/10/15 0849  BP: 157/83 146/68 127/72 138/73  Pulse: 69 91 91 85  Temp:      TempSrc:      Resp:      Height:      Weight:      SpO2:       Exam: Alert, no distress No jvd Chest clear bilat RRR no MRG ABd obese soft ntnd +bs No LE edema L BKA R chest TDC intact Neuro is alert ox 3  MWF [facility]    4h  2/2.25 bath   122.5kg   Heparin 3700  R IJ perm cath  400/800 Hect 2 ug  Venofer 100 x 5 last 11/11 (needs 2 more)      Assessment: 1 Dyspnea/ pulm edema/ vol excess - improving with HD. Poor compliance w fluids as OP 2 Vol 4 kg over dry today 3 HTN cont meds 4 ESRD HD MWF 5 DM2 per primary 6 MBD cont binders  Plan - HD today. OK for dc after HD today. Will resume HD at OP center tomorrow.    Kelly Splinter MD [facility] pager [phone]    cell [phone] 03/10/2015, 9:35 AM    Recent Labs Lab 03/09/15 0057 03/10/15 0347  NA 139 134*  K 5.6* 5.3*  CL 101 95*  CO2 27 29  GLUCOSE 146* 189*  BUN 67* 41*  CREATININE 9.27* 6.66*  CALCIUM 8.9 9.0  PHOS  --  6.6*    Recent Labs Lab 03/09/15 0057 03/10/15 0347  AST 18  --   ALT 14*  --   ALKPHOS 70  --   BILITOT 0.5  --   PROT 7.2  --   ALBUMIN 3.5 3.4*    Recent Labs Lab 03/09/15 0057 03/10/15 0347  WBC 10.4 7.5  NEUTROABS 7.8*  --   HGB 11.3* 11.0*  HCT 36.5* 35.8*  MCV 102.5* 103.2*  PLT 197 176   . allopurinol  200 mg Oral Daily  . amLODipine  10 mg Oral Daily  . aspirin EC  81 mg Oral QHS  . atorvastatin  40 mg Oral QHS  . calcium acetate  1,334 mg Oral TID WC  . calcium acetate  667 mg Oral With snacks  . docusate sodium  200 mg Oral TID  . famotidine  20 mg Oral Daily  . fluticasone  2 spray Each Nare Daily  . furosemide  80 mg Oral BID  . heparin  5,000 Units Subcutaneous 3 times per day  . HYDROmorphone       . insulin pump   Subcutaneous TID AC, HS, 0200  . levothyroxine  100 mcg Oral QHS  . loratadine  10 mg Oral Daily  . metoCLOPramide  5 mg Oral TID PC & HS  . metoprolol succinate  50 mg Oral Daily  . multivitamin  1 tablet Oral Daily  . prasugrel  10 mg Oral Daily  . rOPINIRole  2 mg Oral Q supper  . sevelamer carbonate  1,600 mg Oral TID WC  . sodium chloride  3 mL Intravenous Q12H     acetaminophen, diphenhydrAMINE, glucagon (human recombinant), hydrocortisone cream, HYDROmorphone (DILAUDID) injection, HYDROmorphone, nitroGLYCERIN, promethazine

## 2015-03-10 NOTE — Discharge Instructions (Signed)
Follow with Primary MD ASRES,ALEHEGN, MD in 7 days   Get CBC, CMP checked  by Primary MD next visit.    Activity: As tolerated with Full fall precautions use walker/cane & assistance as needed   Disposition Home    Diet: Heart Healthy , renal modified with 1200 cc fluid restriction , with feeding assistance and aspiration precautions.  For Heart failure patients - Check your Weight same time everyday, if you gain over 2 pounds, or you develop in leg swelling, experience more shortness of breath or chest pain, call your Primary MD immediately. Follow Cardiac Low Salt Diet and 1.5 lit/day fluid restriction.   On your next visit with your primary care physician please Get Medicines reviewed and adjusted.   Please request your Prim.MD to go over all Hospital Tests and Procedure/Radiological results at the follow up, please get all Hospital records sent to your Prim MD by signing hospital release before you go home.   If you experience worsening of your admission symptoms, develop shortness of breath, life threatening emergency, suicidal or homicidal thoughts you must seek medical attention immediately by calling 911 or calling your MD immediately  if symptoms less severe.  You Must read complete instructions/literature along with all the possible adverse reactions/side effects for all the Medicines you take and that have been prescribed to you. Take any new Medicines after you have completely understood and accpet all the possible adverse reactions/side effects.   Do not drive, operating heavy machinery, perform activities at heights, swimming or participation in water activities or provide baby sitting services if your were admitted for syncope or siezures until you have seen by Primary MD or a Neurologist and advised to do so again.  Do not drive when taking Pain medications.    Do not take more than prescribed Pain, Sleep and Anxiety Medications  Special Instructions: If you have  smoked or chewed Tobacco  in the last 2 yrs please stop smoking, stop any regular Alcohol  and or any Recreational drug use.  Wear Seat belts while driving.   Please note  You were cared for by a hospitalist during your hospital stay. If you have any questions about your discharge medications or the care you received while you were in the hospital after you are discharged, you can call the unit and asked to speak with the hospitalist on call if the hospitalist that took care of you is not available. Once you are discharged, your primary care physician will handle any further medical issues. Please note that NO REFILLS for any discharge medications will be authorized once you are discharged, as it is imperative that you return to your primary care physician (or establish a relationship with a primary care physician if you do not have one) for your aftercare needs so that they can reassess your need for medications and monitor your lab values.

## 2015-04-06 ENCOUNTER — Ambulatory Visit: Payer: Medicare Other | Admitting: Physician Assistant

## 2015-04-06 ENCOUNTER — Other Ambulatory Visit: Payer: Self-pay | Admitting: Nephrology

## 2015-04-06 DIAGNOSIS — G459 Transient cerebral ischemic attack, unspecified: Secondary | ICD-10-CM

## 2015-04-09 ENCOUNTER — Other Ambulatory Visit: Payer: Medicare Other

## 2015-04-09 ENCOUNTER — Ambulatory Visit
Admission: RE | Admit: 2015-04-09 | Discharge: 2015-04-09 | Disposition: A | Discharge status: Home / Self Care | Payer: Medicare Other | Source: Ambulatory Visit | Attending: Nephrology | Admitting: Nephrology

## 2015-04-09 DIAGNOSIS — G459 Transient cerebral ischemic attack, unspecified: Secondary | ICD-10-CM

## 2015-04-12 NOTE — Progress Notes (Signed)
Cardiology Office Note    Date:  04/13/2015   ID:  Kenaan Rueter, DOB 02/13/1966, MRN [id number]  PCP:  [name], MD  Cardiologist:  Dr. Lauree Chandler   Electrophysiologist:  n/a  Chief Complaint  Patient presents with  . Surgical Clearance    History of Present Illness:  D[name] is a 49 y.o. male with a hx of diabetes, HTN, sleep apnea, ESRD, fibromyalgia, chronic anemia, status post left BKA. He underwent renal transplant and had been off of dialysis. He was seen by Dr. Lauree Chandler in 05/2012 in the hospital for chest pain. Diagnostic options were limited as cardiac catheterization would have been detrimental to his transplanted kidney. Therefore, stress testing was not thought to be helpful. Medical therapy was recommended.  He was then admitted 06/2012 with severe septic shock secondary to Salmonella bacteremia. He developed worsening renal function and ultimately hemodialysis was resumed.  He returned in 01/2014 with complaints of dyspnea with exertion.  Myoview demonstrated inferior, apical and apical lateral ischemia (mod risk). LHC demonstrated severe RCA stenosis that was treated with POBA.He ultimately underwent PCI with rotablator atherectomy and DES to the mid RCA.  Patient had significant IV dye allergic reaction post PCI.   Admitted 5/16 with chest pain. Cardiac markers remained normal. Echocardiogram demonstrated normal EF with normal wall motion.   I last saw him for FU in 8/16.  Since then, he was admitted in 11/16 with chest pain in the setting of hypertensive emergency. Cardiac enzymes remained normal. Echocardiogram demonstrated normal LV function with moderate diastolic dysfunction. Inpatient Myoview was low risk. He was readmitted several days later with acute pulmonary edema. He was treated with dialysis.  Returns for surgical clearance. He needs AV fistula placed in his L arm in January.  He denies chest pain, significant dyspnea,  orthopnea, PND, syncope.  No fevers, cough, bleeding.    Past Medical History  Diagnosis Date  . Hypertension   . Anemia of chronic disease   . Cholecystitis     a. 08/27/2011  . Constipation   . Pericardial effusion     a.  Small by CT 08/22/11;  b.  Large by CT 08/27/11  . Inguinal lymphadenopathy     a. bilateral - s/p biopsy 07/2011  . Fibromyalgia   . Chronic diastolic CHF (congestive heart failure) ([place])     a. Echo 3/13:  EF 55-60%;  b. Echo (11/15):  Mild LVH, EF 60-65%, no RWMA, Gr 1 DD, MAC, mild LAE, normal RVF, mild RAE;  c.  Echo 5/16:  severe LVH, EF 55-60%, no RWMA, Gr 1 DD, MAC, mild LAE  . HLD (hyperlipidemia)   . Hypothyroidism   . GERD (gastroesophageal reflux disease)   . CAD (coronary artery disease)     a. Lexiscan Myoview (11/15):  Inf, apical cap and apical lateral ischemia, EF 56%, inf HK, Moderate Risk;  b. LHC (11/15):  mid to dist Dx 80%, mid RCA 99% (functional CTO), dist RCA 80% >> PCI: balloon angioplasty to mid RCA (could not deliver stent) - plan staged PCI with HSRA of RCA; Dx to be tx medically >> PCI: Rotoblator atherectomy/DES to [facility]  . OSA (obstructive sleep apnea)     adjustable bed  . Shortness of breath dyspnea     too much Fluid  . Pneumonia ~ 2007  . Insulin dependent diabetes mellitus (HCC)     Type I  . Claustrophobia     when things get around  his face.   [name] Kitchen ESRD (end stage renal disease) ([place])     a. 1995 s/p cadaveric transplant w/ susbequent failure after 18 yrs;  b.Dialysis initiated 07/2011 [facility] M-W-F  . Constipation   . Arthritis     "hands, right knee" (03/19/2014)  . History of blood transfusion     Past Surgical History  Procedure Laterality Date  . Kidney transplant  04/25/1993  . Cataract extraction w/ intraocular lens  implant, bilateral Bilateral 1990's  . Arteriovenous graft placement Left 1993?    forearm  . Arteriovenous graft placement Right 1993?    leg  . Toe amputation Bilateral after 2005  . Below knee leg  amputation Left 2010  . Carpal tunnel release Bilateral after 2005  . Shoulder arthroscopy Left ~ 2006    frozen  . Av fistula placement  07/29/2011    Procedure: ARTERIOVENOUS (AV) FISTULA CREATION;  Surgeon: Mal Misty, MD;  Location: [place];  Service: Vascular;  Laterality: Right;  Brachial cephalic  . Insertion of dialysis catheter  08/01/2011    Procedure: INSERTION OF DIALYSIS CATHETER;  Surgeon: Rosetta Posner, MD;  Location: [place];  Service: Vascular;  Laterality: Right;  insertion of dialysis catheter on right internal jugular vein  . Lymph node biopsy  08/10/2011    Procedure: LYMPH NODE BIOPSY;  Surgeon: Merrie Roof, MD;  Location: [place];  Service: General;  Laterality: Left;  left groin lymph biopsy  . Colonoscopy  08/29/2011    Procedure: COLONOSCOPY;  Surgeon: Jerene Bears, MD;  Location: [place];  Service: Gastroenterology;  Laterality: N/A;  . Appendectomy  ~ 2006  . Below knee leg amputation Left   . Umbilical hernia repair  1990's  . Cardiac catheterization  03/19/2014  . Av graft placement Left 1993?    "attempted one in my wrist; didn't take"  . Neuroplasty / transposition ulnar nerve at elbow Left after 2005  . Venogram Right 07/28/2011    Procedure: VENOGRAM;  Surgeon: Conrad Brady, MD;  Location: [facility] CATH LAB;  Service: Cardiovascular;  Laterality: Right;  . Left heart catheterization with coronary angiogram N/A 03/19/2014    Procedure: LEFT HEART CATHETERIZATION WITH CORONARY ANGIOGRAM;  Surgeon: Burnell Blanks, MD;  Location: [facility] CATH LAB;  Service: Cardiovascular;  Laterality: N/A;  . Cardiac catheterization  03/19/2014    Procedure: CORONARY BALLOON ANGIOPLASTY;  Surgeon: Burnell Blanks, MD;  Location: [facility] CATH LAB;  Service: Cardiovascular;;  . Coronary angioplasty with stent placement  04/09/2014        ptca/des mid lad   . Angioplasty  04/09/2014    RCA   . Femoral artery repair  04/09/2014    ANGIOSEAL  . Percutaneous coronary rotoblator  intervention (pci-r) N/A 04/09/2014    Procedure: PERCUTANEOUS CORONARY ROTOBLATOR INTERVENTION (PCI-R);  Surgeon: Burnell Blanks, MD;  Location: [facility] CATH LAB;  Service: Cardiovascular;  Laterality: N/A;  . Revison of arteriovenous fistula Right [id number]    Procedure: PLICATION / REVISION OF ARTERIOVENOUS FISTULA;  Surgeon: Angelia Mould, MD;  Location: [place];  Service: Vascular;  Laterality: Right;  . Peripheral vascular catheterization N/A 12/25/2014    Procedure: Upper Extremity Venography;  Surgeon: Conrad Belton, MD;  Location: [place] CV LAB;  Service: Cardiovascular;  Laterality: N/A;  . Carpal tunnel release Bilateral   . Eye surgery Bilateral     cataract  . Amputation of replicated toes Right   . Vitrectomy Bilateral   .  Peritoneal catheter insertion    . Peritoneal catheter removal    . Arteriovenous graft placement Left     Thigh  . Arteriovenous graft removed Left     Thigh  . Av fistula placement Left 01/20/2015    Procedure: LIGATION OF RIGHT ARTERIOVENOUS FISTULA WITH EXCISION OF ANEURYSM;  Surgeon: Angelia Mould, MD;  Location: [place];  Service: Vascular;  Laterality: Left;  . Insertion of dialysis catheter Right 01/20/2015    Procedure: INSERTION OF DIALYSIS CATHETER - RIGHT INTERNAL JUGULAR ;  Surgeon: Angelia Mould, MD;  Location: [place];  Service: Vascular;  Laterality: Right;    Current Outpatient Prescriptions  Medication Sig Dispense Refill  . allopurinol (ZYLOPRIM) 100 MG tablet Take 200 mg by mouth daily.     [name] Kitchen amLODipine (NORVASC) 10 MG tablet Take 1 tablet (10 mg total) by mouth daily. 30 tablet 0  . aspirin EC 81 MG tablet Take 81 mg by mouth at bedtime.    [name] Kitchen atorvastatin (LIPITOR) 40 MG tablet Take 40 mg by mouth at bedtime.  0  . calcium acetate (PHOSLO) 667 MG capsule Take 667-1,334 mg by mouth See admin instructions. Take 2 capsules (1334 mg) by mouth with meals and 1 capsule (667 mg) with snacks    . diphenhydrAMINE  (BENADRYL) 25 mg capsule Take 1-2 capsules (25-50 mg total) by mouth every 6 (six) hours as needed for itching. (Patient taking differently: Take 25-50 mg by mouth every 6 (six) hours as needed for itching or sleep. ) 30 capsule 0  . docusate sodium (COLACE) 100 MG capsule Take 200 mg by mouth 3 (three) times daily.     . fenofibrate (TRICOR) 48 MG tablet Take 1 tablet (48 mg total) by mouth daily. 30 tablet 0  . fluticasone (FLONASE) 50 MCG/ACT nasal spray Place 2 sprays into both nostrils daily.    . furosemide (LASIX) 80 MG tablet Take 80 mg by mouth 2 (two) times daily.   98  . GLUCAGON EMERGENCY 1 MG injection Inject 1 mg into the muscle daily as needed (low blood sugar).     . hydrocortisone 2.5 % cream Apply 1 application topically 4 (four) times daily as needed (itching).    [name] Kitchen HYDROmorphone (DILAUDID) 2 MG tablet Take 1 tablet (2 mg total) by mouth 3 (three) times daily as needed for severe pain. (Patient taking differently: Take 2 mg by mouth See admin instructions. Take 1 tablet (2 mg) by mouth daily at bedtime, may take an additional dose during the day as needed for severe pain) 20 tablet 0  . Insulin Human (INSULIN PUMP) 100 unit/ml SOLN Inject into the skin continuous. Humalog    . levothyroxine (SYNTHROID, LEVOTHROID) 100 MCG tablet Take 100 mcg by mouth at bedtime.     [name] Kitchen loratadine (CLARITIN) 10 MG tablet Take 10 mg by mouth daily.    . metoCLOPramide (REGLAN) 5 MG tablet Take 5 mg by mouth 4 (four) times daily - after meals and at bedtime.    . metoprolol succinate (TOPROL-XL) 50 MG 24 hr tablet Take 1 tablet (50 mg total) by mouth daily. Take with or immediately following a meal. 30 tablet 0  . multivitamin (RENA-VIT) TABS tablet Take 1 tablet by mouth daily.    . nitroGLYCERIN (NITROSTAT) 0.4 MG SL tablet Place 0.4 mg under the tongue every 5 (five) minutes as needed for chest pain.     . prasugrel (EFFIENT) 10 MG TABS tablet Take 1 tablet (10 mg total) by  mouth daily. 30 tablet 3    . promethazine (PHENERGAN) 25 MG tablet Take 1 tablet (25 mg total) by mouth every 6 (six) hours as needed for nausea or vomiting. For nausea 30 tablet 0  . ranitidine (ZANTAC) 150 MG tablet Take 150 mg by mouth 2 (two) times daily.  4  . rOPINIRole (REQUIP) 2 MG tablet Take 2 mg by mouth daily with supper.     . sevelamer carbonate (RENVELA) 800 MG tablet Take 1,600 mg by mouth 3 (three) times daily with meals.     No current facility-administered medications for this visit.   Facility-Administered Medications Ordered in Other Visits  Medication Dose Route Frequency Provider Last Rate Last Dose  . 0.9 %  sodium chloride infusion   Intravenous Continuous Angelia Mould, MD      . Chlorhexidine Gluconate Cloth 2 % PADS 6 each  6 each Topical Once Angelia Mould, MD        Allergies:   Contrast media; Morphine and related; Adhesive; Amoxicillin; Ciprofloxacin; Clindamycin/lincomycin; Codeine; Doxycycline; Hydrocodone; Levofloxacin; Oxycodone; Plavix; Rocephin; and Vasotec   Social History   Social History  . Marital Status: Married    Spouse Name: N/A  . Number of Children: N/A  . Years of Education: N/A   Occupational History  . Disabled    Social History Main Topics  . Smoking status: Never Smoker   . Smokeless tobacco: Never Used  . Alcohol Use: Yes     Comment: 11/19/14 "might have a drink 2-3 times/year"  . Drug Use: No  . Sexual Activity: Not Currently   Other Topics Concern  . None   Social History Narrative   Lives @ home with his wife in [place].      Family History:  [name] family history includes Breast cancer in his maternal aunt; Cancer in his maternal aunt; Colon polyps in his father; Diabetes in his father and mother; Hyperlipidemia in his mother; Hypertension in his father and mother. There is no history of Malignant hyperthermia, Heart attack, or Stroke.   ROS:   Please see the history of present illness.    Review of Systems   HENT: Positive for hearing loss.   Cardiovascular: Positive for dyspnea on exertion.  Respiratory: Positive for shortness of breath.   Hematologic/Lymphatic: Bruises/bleeds easily.  Psychiatric/Behavioral: The patient is nervous/anxious.   All other systems reviewed and are negative.   PHYSICAL EXAM:   VS:  BP 122/82 mmHg  Pulse 96  Ht 5\' 9"  (1.753 m)  Wt 281 lb (127.461 kg)  BMI 41.48 kg/m2   GEN: Well nourished, well developed, in no acute distress HEENT: normal Neck: no JVD at 90 degrees Cardiac: RRR; no murmurs, rubs, or gallops,no edema  Respiratory:  clear to auscultation bilaterally, no wheezing or rales GI: soft, nontender  MS: no deformity or atrophy Skin: warm and dry, no rash Neuro:  Alert and Oriented x 3  Psych: euthymic mood   Wt Readings from Last 3 Encounters:  04/13/15 281 lb (127.461 kg)  03/10/15 276 lb 0.3 oz (125.2 kg)  03/01/15 286 lb 2 oz (129.785 kg)      Studies/Labs Reviewed:   EKG:  EKG is  ordered today.  The ekg ordered today demonstrates NSR, HR 96, normal axis, QTC 480 ms, no change since prior tracing  Recent Labs: 10/01/2014: Magnesium 2.7* 02/25/2015: B Natriuretic Peptide 444.3* 03/09/2015: ALT 14* 03/10/2015: BUN 41*; Creatinine, Ser 6.66*; Hemoglobin 11.0*; Platelets 176; Potassium 5.3*; Sodium 134*  Lipid Panel    Component Value Date/Time   CHOL 143 02/25/2015 1400   TRIG 255* 02/25/2015 1400   HDL 29* 02/25/2015 1400   CHOLHDL 4.9 02/25/2015 1400   VLDL 51* 02/25/2015 1400   LDLCALC 63 02/25/2015 1400    Additional studies/ records that were reviewed today include:   Carotid US 04/09/15 IMPRESSION: 1. No evidence of significant atherosclerotic plaque or stenosis. 2. Vertebral arteries are patent with normal antegrade flow. 3. Intermittently irregular cardiac rhythm.  Myoview 02/26/15 Overall Study Impression:  Myocardial perfusion is normal. The study is normal. This is a low risk study. LV cavity size is normal. The  left ventricular ejection fraction is mildly decreased (45-54%). There is no prior study for comparison.  Echo 02/25/15 Mod LVH, EF 60-65%, no RWMA, Gr 2 DD, mild MR  Echo 09/21/14 Severe LVH, EF 55-60%, normal wall motion, grade 1 diastolic dysfunction, MAC, mild LAE  PCI 04/09/14 1. Unstable angina secondary to severe stenosis mid RCA 2. Successful rotablator atherectomy/PTCA/DES mid RCA   ASSESSMENT:    1. Pre-operative cardiovascular examination   2. Coronary artery disease involving native coronary artery of native heart without angina pectoris   3. Chronic diastolic CHF (congestive heart failure) ([place])   4. ESRD (end stage renal disease) ([place])   5. Essential hypertension   6. HLD (hyperlipidemia)     PLAN:  In order of problems listed above:  1. Surgical Clearance -  He needs L arm AV fistula in 04/2015.  He is not having any unstable cardiac conditions.  He had a recent low risk nuclear stress test.  No further cardiac workup needed.  He is at acceptable risk.  He may hold his Effient 7 days prior and resume after surgery when felt to be safe by the surgeon.   2. CAD - No angina. Myoview 11/16 low risk with normal perfusion.  Low BP has limited use of beta-blocker.  BP is now higher and he is on a good dose of Toprol-XL. Continue ASA, Effient, statin.  He is > 12 mos post PCI with DES to the RCA.  As noted above, he may hold his Effient 7 days prior to his surgery and resume post op when it is safe from a surgical standpoint. He is allergic to Plavix.  I will review with Dr. Lauree Chandler to decide if we should keep him on ASA + Effient long term vs ASA alone.    3. Chronic diastolic CHF -  Volume management per dialysis.   4. ESRD (end stage renal disease): He is on MWF dialysis.  5. Essential hypertension -  BP controlled.  Continue current Rx.   6. HLD (hyperlipidemia): Continue statin.    Medication Adjustments/Labs and Tests Ordered: Current medicines are  reviewed at length with the patient today.  Concerns regarding medicines are outlined above.  Medication changes, Labs and Tests ordered today are listed below.  Patient Instructions  Medication Instructions:  A REFILL FOR EFFIENT WAS SENT IN TODAY  Labwork: NONE  Testing/Procedures: NONE  Follow-Up: 6  MONTHS WITH DR. [name]  Any Other Special Instructions Will Be Listed Below (If Applicable). OK TO HOLD EFFIENT 7 DAYS BEFORE SURGERY; RESUME EFFIENT AFTER SURGERY ONCE THE SURGEON SAYYS IT IS OK  If you need a refill on your cardiac medications before your next appointment, please call your pharmacy.     Signed, Richardson Dopp, PA-C  04/13/2015 5:03 PM    [facility]  [address], [place], [name]  [address] Phone: [phone]; Fax: [phone]

## 2015-04-13 ENCOUNTER — Ambulatory Visit (INDEPENDENT_AMBULATORY_CARE_PROVIDER_SITE_OTHER): Payer: Medicare Other | Admitting: Physician Assistant

## 2015-04-13 ENCOUNTER — Encounter: Payer: Self-pay | Admitting: Physician Assistant

## 2015-04-13 VITALS — BP 122/82 | HR 96 | Ht 69.0 in | Wt 281.0 lb

## 2015-04-13 DIAGNOSIS — I2510 Atherosclerotic heart disease of native coronary artery without angina pectoris: Secondary | ICD-10-CM

## 2015-04-13 DIAGNOSIS — E785 Hyperlipidemia, unspecified: Secondary | ICD-10-CM

## 2015-04-13 DIAGNOSIS — I10 Essential (primary) hypertension: Secondary | ICD-10-CM

## 2015-04-13 DIAGNOSIS — I5032 Chronic diastolic (congestive) heart failure: Secondary | ICD-10-CM | POA: Diagnosis not present

## 2015-04-13 DIAGNOSIS — N186 End stage renal disease: Secondary | ICD-10-CM

## 2015-04-13 DIAGNOSIS — Z01810 Encounter for preprocedural cardiovascular examination: Secondary | ICD-10-CM

## 2015-04-13 MED ORDER — PRASUGREL HCL 10 MG PO TABS: 10.0000 mg | ORAL_TABLET | Freq: Every day | ORAL | Status: DC

## 2015-04-13 NOTE — Patient Instructions (Signed)
Medication Instructions:  A REFILL FOR EFFIENT WAS SENT IN TODAY  Labwork: NONE  Testing/Procedures: NONE  Follow-Up: 6  MONTHS WITH DR. [name]  Any Other Special Instructions Will Be Listed Below (If Applicable). OK TO HOLD EFFIENT 7 DAYS BEFORE SURGERY; RESUME EFFIENT AFTER SURGERY ONCE THE SURGEON SAYYS IT IS OK  If you need a refill on your cardiac medications before your next appointment, please call your pharmacy.

## 2015-04-21 ENCOUNTER — Telehealth: Payer: Self-pay | Admitting: Physician Assistant

## 2015-04-21 NOTE — Telephone Encounter (Signed)
Please tell patient I reviewed his case with Dr. Lauree Chandler. He can stop Effient and remain on ASA alone. Richardson Dopp, PA-C   04/21/2015 5:09 PM

## 2015-04-22 ENCOUNTER — Telehealth: Payer: Self-pay | Admitting: Cardiovascular Disease

## 2015-04-22 NOTE — Telephone Encounter (Signed)
Pt notified per Richardson Dopp, PA and Dr. [name] to d/c Effient and remain on ASA alone. Pt states he just bought Effient. I advised pt he could finish the Effient then when done stay just on the ASA. Pt said thank you and is agreeable to plan of care.

## 2015-04-22 NOTE — Telephone Encounter (Signed)
Lmptcb to advise per Brynda Rim. PA and Dr. [name] pt can d/c Effient and remain on ASA alone.

## 2015-04-22 NOTE — Telephone Encounter (Signed)
Pt rtn call to Carol-gave him msg from [employer] to d/c Effient and continue ASA-pt said he was suppose to be told what to do after the procedure and that you called him so you could talk to him so to put you on the phone- I explained you were in clinic which was why I was relaying the previous msg to him but I could have you call back -he hung up on me -so if you want to call his number is [phone]

## 2015-04-22 NOTE — Telephone Encounter (Signed)
Follow up  ° ° °Patient returning call back to nurse  °

## 2015-04-29 ENCOUNTER — Encounter (HOSPITAL_COMMUNITY): Payer: Self-pay | Admitting: *Deleted

## 2015-04-29 MED ORDER — CHLORHEXIDINE GLUCONATE CLOTH 2 % EX PADS: 6.0000 | MEDICATED_PAD | Freq: Once | CUTANEOUS | Status: DC

## 2015-04-29 MED ORDER — SODIUM CHLORIDE 0.9 % IV SOLN: INTRAVENOUS | Status: DC

## 2015-04-29 MED ORDER — VANCOMYCIN HCL 10 G IV SOLR
1500.0000 mg | INTRAVENOUS | Status: AC
  Administered 2015-04-30: 1500 mg via INTRAVENOUS
  Filled 2015-04-29: qty 1500

## 2015-04-29 NOTE — Progress Notes (Signed)
Pt denies any acute cardiopulmonary issues. Pt is under the care of Dr. [name], cardiology. Pt stated that he didn't need instructions regarding  his insulin pump for surgery ; pt stated " I had a pump ( not this one) for 6 years! "  Diabetic Coordinator, Sonia Baller, made aware of pt surgery tomorrow scheduled for 10:30 A.M.. Pt made aware to stop taking otc vitamins and herbal medications. Pt verbalized understanding of all pre-op instructions. Spoke with Willeen Cass, NP, anesthesia, regarding pt history ( see note).

## 2015-04-29 NOTE — Progress Notes (Signed)
Anesthesia Chart Review:  Pt is a 50 year old male scheduled for L AV fistula creation vs basilic vein transposition vs graft insertion on 04/30/2015 with Dr. Scot Dock.   Pt is a same day work up.   History includes ESRD s/p renal transplant '95 but now on HD (MWF), CAD s/p DES RCA 04/09/14, PAD s/p left BKA '10, DM1 with insulin pump, hypothyroidism, anemia, fibromyalgia, hypercholesterolemia, OSA (can't tolerate CPAP due to claustrophobia), GERD. Last year he was evaluated at [facility] for consideration of right first rib resection and right subclavian venoplasty due to ongoing thoracic outlet syndrome and outflow stenosis of his right brachiocephalic fistula. However, venogram was performed in 10/2013 and his right SCV was occluded and unable to be revascularized (see Care Everywhere), DIFFICULT INTUBATION. BMI is consistent with morbid obesity. Nephrologist is Dr. Mercy Moore. Endocrinologist is Dr. Iran Planas with [facility].   In regards to his anesthesia history, I reviewed [facility] records in [place] from 10/16/13 that indicate glidescope was used. At [facility] on 08/10/11, he underwent GETA using IV induction, cricoid pression, MAC 4, stylet to place 7.5 mm ETT.  Anesthesia records from 01/20/15 AV fistula ligation show 1 attempt at intubation using Glidescope and 7.40mm ETT.   Cardiologist is Dr. [name], last cardiology evaluation was with Richardson Dopp, PA-C on 04/13/15.   Meds include allopurinol, amlodipine, ASA 81 mg, Lipitor, Phoslo, Dilaudid, insulin pump, Lasix, levothyroxine, Claritin, Reglan, Toprol XL, Nitro, Phenergan, Requip, Renvela  Pt will need labs DOS.   1 view CXR 03/09/15: Progressive pulmonary edema, worsening CHF.  EKG 04/13/15: NSR. Prolonged QT.   Carotid duplex US 04/09/15:  1. No evidence of significant atherosclerotic plaque or stenosis. 2. Vertebral arteries are patent with normal antegrade flow. 3. Intermittently irregular cardiac rhythm.  Nuclear  stress test 02/26/15:   There was no ST segment deviation noted during stress.  Defect 1: There is a small defect of mild severity present in the mid anterior location.  The study is normal.  This is a low risk study.  The left ventricular ejection fraction is mildly decreased (45-54%).  02/25/15 Echo: - Left ventricle: The cavity size was normal. Wall thickness was increased in a pattern of moderate LVH. Systolic function was normal. The estimated ejection fraction was in the range of 60% to 65%. Wall motion was normal; there were no regional wall motion abnormalities. Features are consistent with a pseudonormal left ventricular filling pattern, with concomitant abnormal relaxation and increased filling pressure (grade 2 diastolic dysfunction). - Mitral valve: There was mild regurgitation.  03/19/14 Cardiac cath (done due to abnormal stress test):  Angiographic Findings: Left main: No obstructive disease.  Left Anterior Descending Artery: Large caliber vessel that courses to the apex. There is diffuse mild plaque in the proximal and mid vessel. There is a moderate caliber diagonal branch with mid to distal 80% stenosis.  Circumflex Artery: Large vessel with large obtuse marginal branch. Mild diffuse plaque.  Right Coronary Artery: Large caliber dominant vessel with 99% sub-total occlusion (functional CTO) with TIMI-1 flow noted beyond the stenosis. The distal vessel is also noted to have a 80% stenosis. The distal vessel fills from left to right collaterals.  Left Ventricular Angiogram: Deferred.  Impression: 1. Severe stenosis mid RCA, s/p balloon angioplasty only of the mid RCA. (unable to deliver stent) 2. Severe stenosis mid to distal diagonal-medical management of this vessel 3. Patent Circumflex and LAD Recommendations: I was unable to deliver a stent into the mid RCA despite aggressive guide  support with a Guideliner secondary to diffuse, severe calcification of the vessel. Will  place on telemetry unit tonight and monitor closely. He will need rotablator atherectomy of the mid RCA. This will be staged in 2-3 weeks. I will plan on discharge home in the am unless he has clinical instability. I will contact him to arrange a date and time for cath/PCI. Will continue ASA, Plavix, statin, beta blocker. (Dr. [name]). Intervention (04/09/14): Successful rotablator atherectomy and PTCA/DES mid RCA.  Pt has cardiac clearance for procedure from Richardson Dopp, [place] in Epic note dated 04/12/15.  If labs acceptable DOS, I anticipate pt can proceed as scheduled.   Willeen Cass, FNP-BC [facility]/Anesthesiology Phone: [phone] 04/29/2015 4:23 PM

## 2015-04-30 ENCOUNTER — Ambulatory Visit (HOSPITAL_COMMUNITY)
Admission: RE | Admit: 2015-04-30 | Discharge: 2015-04-30 | Disposition: A | Discharge status: Home / Self Care | Payer: Medicare Other | Source: Ambulatory Visit | Attending: Vascular Surgery | Admitting: Vascular Surgery

## 2015-04-30 ENCOUNTER — Encounter (HOSPITAL_COMMUNITY)
Admission: RE | Disposition: A | Discharge status: Home / Self Care | Payer: Self-pay | Source: Ambulatory Visit | Attending: Vascular Surgery

## 2015-04-30 ENCOUNTER — Other Ambulatory Visit: Payer: Self-pay | Admitting: *Deleted

## 2015-04-30 ENCOUNTER — Encounter (HOSPITAL_COMMUNITY): Payer: Self-pay | Admitting: *Deleted

## 2015-04-30 ENCOUNTER — Ambulatory Visit (HOSPITAL_COMMUNITY): Payer: Medicare Other | Admitting: Emergency Medicine

## 2015-04-30 DIAGNOSIS — Z7982 Long term (current) use of aspirin: Secondary | ICD-10-CM | POA: Diagnosis not present

## 2015-04-30 DIAGNOSIS — I132 Hypertensive heart and chronic kidney disease with heart failure and with stage 5 chronic kidney disease, or end stage renal disease: Secondary | ICD-10-CM | POA: Insufficient documentation

## 2015-04-30 DIAGNOSIS — Z992 Dependence on renal dialysis: Secondary | ICD-10-CM | POA: Diagnosis not present

## 2015-04-30 DIAGNOSIS — M159 Polyosteoarthritis, unspecified: Secondary | ICD-10-CM | POA: Diagnosis not present

## 2015-04-30 DIAGNOSIS — N186 End stage renal disease: Secondary | ICD-10-CM | POA: Insufficient documentation

## 2015-04-30 DIAGNOSIS — Z6841 Body Mass Index (BMI) 40.0 and over, adult: Secondary | ICD-10-CM | POA: Diagnosis not present

## 2015-04-30 DIAGNOSIS — Z9641 Presence of insulin pump (external) (internal): Secondary | ICD-10-CM | POA: Diagnosis not present

## 2015-04-30 DIAGNOSIS — K219 Gastro-esophageal reflux disease without esophagitis: Secondary | ICD-10-CM | POA: Insufficient documentation

## 2015-04-30 DIAGNOSIS — I2510 Atherosclerotic heart disease of native coronary artery without angina pectoris: Secondary | ICD-10-CM | POA: Insufficient documentation

## 2015-04-30 DIAGNOSIS — Z955 Presence of coronary angioplasty implant and graft: Secondary | ICD-10-CM | POA: Insufficient documentation

## 2015-04-30 DIAGNOSIS — Z4931 Encounter for adequacy testing for hemodialysis: Secondary | ICD-10-CM

## 2015-04-30 DIAGNOSIS — Z89512 Acquired absence of left leg below knee: Secondary | ICD-10-CM | POA: Insufficient documentation

## 2015-04-30 DIAGNOSIS — E1022 Type 1 diabetes mellitus with diabetic chronic kidney disease: Secondary | ICD-10-CM | POA: Insufficient documentation

## 2015-04-30 DIAGNOSIS — Z794 Long term (current) use of insulin: Secondary | ICD-10-CM | POA: Insufficient documentation

## 2015-04-30 DIAGNOSIS — D638 Anemia in other chronic diseases classified elsewhere: Secondary | ICD-10-CM | POA: Insufficient documentation

## 2015-04-30 DIAGNOSIS — Z940 Kidney transplant status: Secondary | ICD-10-CM | POA: Insufficient documentation

## 2015-04-30 DIAGNOSIS — G4733 Obstructive sleep apnea (adult) (pediatric): Secondary | ICD-10-CM | POA: Insufficient documentation

## 2015-04-30 DIAGNOSIS — E039 Hypothyroidism, unspecified: Secondary | ICD-10-CM | POA: Insufficient documentation

## 2015-04-30 DIAGNOSIS — E785 Hyperlipidemia, unspecified: Secondary | ICD-10-CM | POA: Insufficient documentation

## 2015-04-30 DIAGNOSIS — Z79899 Other long term (current) drug therapy: Secondary | ICD-10-CM | POA: Insufficient documentation

## 2015-04-30 DIAGNOSIS — Z48812 Encounter for surgical aftercare following surgery on the circulatory system: Secondary | ICD-10-CM

## 2015-04-30 DIAGNOSIS — M797 Fibromyalgia: Secondary | ICD-10-CM | POA: Diagnosis not present

## 2015-04-30 DIAGNOSIS — I5032 Chronic diastolic (congestive) heart failure: Secondary | ICD-10-CM | POA: Insufficient documentation

## 2015-04-30 DIAGNOSIS — E1051 Type 1 diabetes mellitus with diabetic peripheral angiopathy without gangrene: Secondary | ICD-10-CM | POA: Diagnosis not present

## 2015-04-30 DIAGNOSIS — N185 Chronic kidney disease, stage 5: Secondary | ICD-10-CM | POA: Diagnosis not present

## 2015-04-30 HISTORY — DX: Gout, unspecified: M10.9

## 2015-04-30 HISTORY — PX: AV FISTULA PLACEMENT: SHX1204

## 2015-04-30 LAB — GLUCOSE, CAPILLARY
Glucose-Capillary: 173 mg/dL — ABNORMAL HIGH (ref 65–99)
Glucose-Capillary: 94 mg/dL (ref 65–99)
Glucose-Capillary: 99 mg/dL (ref 65–99)
Glucose-Capillary: 107 mg/dL — ABNORMAL HIGH (ref 65–99)

## 2015-04-30 LAB — POCT I-STAT 4, (NA,K, GLUC, HGB,HCT)
Glucose, Bld: 238 mg/dL — ABNORMAL HIGH (ref 65–99)
HCT: 37 % — ABNORMAL LOW (ref 39.0–52.0)
Hemoglobin: 12.6 g/dL — ABNORMAL LOW (ref 13.0–17.0)
Potassium: 4.4 mmol/L (ref 3.5–5.1)
Sodium: 137 mmol/L (ref 135–145)

## 2015-04-30 SURGERY — ARTERIOVENOUS (AV) FISTULA CREATION
Anesthesia: General | Site: Arm Upper | Laterality: Left

## 2015-04-30 MED ORDER — FENTANYL CITRATE (PF) 100 MCG/2ML IJ SOLN
INTRAMUSCULAR | Status: DC
Start: 2015-04-30 — End: 2015-04-30
  Filled 2015-04-30: qty 2

## 2015-04-30 MED ORDER — TRAMADOL HCL 50 MG PO TABS: 50.0000 mg | ORAL_TABLET | Freq: Four times a day (QID) | ORAL | Status: DC | PRN

## 2015-04-30 MED ORDER — 0.9 % SODIUM CHLORIDE (POUR BTL) OPTIME
TOPICAL | Status: DC | PRN
  Administered 2015-04-30: 1000 mL

## 2015-04-30 MED ORDER — ALBUMIN HUMAN 5 % IV SOLN
12.5000 g | Freq: Once | INTRAVENOUS | Status: AC
  Administered 2015-04-30: 12.5 g via INTRAVENOUS

## 2015-04-30 MED ORDER — HEPARIN 1000 UNIT/ML FOR PERITONEAL DIALYSIS: 2.4000 mL | INTRAMUSCULAR | Status: DC | PRN

## 2015-04-30 MED ORDER — LIDOCAINE HCL (CARDIAC) 20 MG/ML IV SOLN
INTRAVENOUS | Status: AC
  Filled 2015-04-30: qty 5

## 2015-04-30 MED ORDER — SODIUM CHLORIDE 0.9 % IV SOLN
INTRAVENOUS | Status: DC | PRN
  Administered 2015-04-30: 500 mL

## 2015-04-30 MED ORDER — SUCCINYLCHOLINE CHLORIDE 20 MG/ML IJ SOLN
INTRAMUSCULAR | Status: DC | PRN
  Administered 2015-04-30: 140 mg via INTRAVENOUS

## 2015-04-30 MED ORDER — DEXTROSE 5 % IV SOLN
10.0000 mg | INTRAVENOUS | Status: DC | PRN
  Administered 2015-04-30: 40 ug/min via INTRAVENOUS

## 2015-04-30 MED ORDER — DEXAMETHASONE SODIUM PHOSPHATE 4 MG/ML IJ SOLN
INTRAMUSCULAR | Status: AC
  Filled 2015-04-30: qty 2

## 2015-04-30 MED ORDER — PHENYLEPHRINE 40 MCG/ML (10ML) SYRINGE FOR IV PUSH (FOR BLOOD PRESSURE SUPPORT)
PREFILLED_SYRINGE | INTRAVENOUS | Status: AC
Start: 2015-04-30 — End: 2015-04-30
  Filled 2015-04-30: qty 20

## 2015-04-30 MED ORDER — ROCURONIUM BROMIDE 50 MG/5ML IV SOLN
INTRAVENOUS | Status: AC
  Filled 2015-04-30: qty 1

## 2015-04-30 MED ORDER — MIDAZOLAM HCL 2 MG/2ML IJ SOLN
INTRAMUSCULAR | Status: AC
Start: 2015-04-30 — End: 2015-04-30
  Filled 2015-04-30: qty 2

## 2015-04-30 MED ORDER — HEPARIN SODIUM (PORCINE) 1000 UNIT/ML IJ SOLN
INTRAMUSCULAR | Status: AC
Start: 2015-04-30 — End: 2015-04-30
  Filled 2015-04-30: qty 1

## 2015-04-30 MED ORDER — ALBUMIN HUMAN 5 % IV SOLN
INTRAVENOUS | Status: AC
  Filled 2015-04-30: qty 250

## 2015-04-30 MED ORDER — THROMBIN 20000 UNITS EX SOLR
CUTANEOUS | Status: AC
  Filled 2015-04-30: qty 20000

## 2015-04-30 MED ORDER — ONDANSETRON HCL 4 MG/2ML IJ SOLN
INTRAMUSCULAR | Status: AC
Start: 2015-04-30 — End: 2015-04-30
  Filled 2015-04-30: qty 2

## 2015-04-30 MED ORDER — FENTANYL CITRATE (PF) 250 MCG/5ML IJ SOLN
INTRAMUSCULAR | Status: AC
  Filled 2015-04-30: qty 5

## 2015-04-30 MED ORDER — PROMETHAZINE HCL 25 MG/ML IJ SOLN: 6.2500 mg | INTRAMUSCULAR | Status: DC | PRN

## 2015-04-30 MED ORDER — LIDOCAINE HCL (CARDIAC) 20 MG/ML IV SOLN
INTRAVENOUS | Status: DC | PRN
  Administered 2015-04-30: 100 mg via INTRAVENOUS

## 2015-04-30 MED ORDER — LIDOCAINE-EPINEPHRINE (PF) 1 %-1:200000 IJ SOLN
INTRAMUSCULAR | Status: AC
Start: 2015-04-30 — End: 2015-04-30
  Filled 2015-04-30: qty 30

## 2015-04-30 MED ORDER — PROTAMINE SULFATE 10 MG/ML IV SOLN
INTRAVENOUS | Status: DC | PRN
  Administered 2015-04-30: 20 mg via INTRAVENOUS
  Administered 2015-04-30: 40 mg via INTRAVENOUS

## 2015-04-30 MED ORDER — PROPOFOL 10 MG/ML IV BOLUS
INTRAVENOUS | Status: DC | PRN
  Administered 2015-04-30: 200 mg via INTRAVENOUS

## 2015-04-30 MED ORDER — ONDANSETRON HCL 4 MG/2ML IJ SOLN
INTRAMUSCULAR | Status: DC | PRN
  Administered 2015-04-30: 4 mg via INTRAVENOUS

## 2015-04-30 MED ORDER — FENTANYL CITRATE (PF) 100 MCG/2ML IJ SOLN
INTRAMUSCULAR | Status: DC | PRN
  Administered 2015-04-30: 100 ug via INTRAVENOUS

## 2015-04-30 MED ORDER — LIDOCAINE-EPINEPHRINE (PF) 1 %-1:200000 IJ SOLN
INTRAMUSCULAR | Status: DC | PRN
  Administered 2015-04-30: 11 mL via INTRADERMAL

## 2015-04-30 MED ORDER — PROTAMINE SULFATE 10 MG/ML IV SOLN
INTRAVENOUS | Status: AC
  Filled 2015-04-30: qty 15

## 2015-04-30 MED ORDER — SUCCINYLCHOLINE CHLORIDE 20 MG/ML IJ SOLN
INTRAMUSCULAR | Status: AC
  Filled 2015-04-30: qty 2

## 2015-04-30 MED ORDER — HEPARIN SODIUM (PORCINE) 1000 UNIT/ML IJ SOLN
INTRAMUSCULAR | Status: DC | PRN
  Administered 2015-04-30: 9000 [IU] via INTRAVENOUS

## 2015-04-30 MED ORDER — FENTANYL CITRATE (PF) 100 MCG/2ML IJ SOLN
INTRAMUSCULAR | Status: AC
  Filled 2015-04-30: qty 2

## 2015-04-30 MED ORDER — PHENYLEPHRINE HCL 10 MG/ML IJ SOLN
INTRAMUSCULAR | Status: DC | PRN
  Administered 2015-04-30: 80 ug via INTRAVENOUS
  Administered 2015-04-30 (×2): 120 ug via INTRAVENOUS
  Administered 2015-04-30: 80 ug via INTRAVENOUS

## 2015-04-30 MED ORDER — DEXTROSE 50 % IV SOLN
INTRAVENOUS | Status: AC
  Filled 2015-04-30: qty 50

## 2015-04-30 MED ORDER — MIDAZOLAM HCL 5 MG/5ML IJ SOLN
INTRAMUSCULAR | Status: DC | PRN
  Administered 2015-04-30: 2 mg via INTRAVENOUS

## 2015-04-30 MED ORDER — FENTANYL CITRATE (PF) 100 MCG/2ML IJ SOLN
25.0000 ug | INTRAMUSCULAR | Status: DC | PRN
  Administered 2015-04-30 (×2): 25 ug via INTRAVENOUS

## 2015-04-30 MED ORDER — SODIUM CHLORIDE 0.9 % IV SOLN
INTRAVENOUS | Status: DC
Start: 2015-04-30 — End: 2015-04-30
  Administered 2015-04-30: 09:00:00 via INTRAVENOUS

## 2015-04-30 MED ORDER — DEXTROSE 50 % IV SOLN
25.0000 mL | Freq: Once | INTRAVENOUS | Status: AC
  Administered 2015-04-30: 25 mL via INTRAVENOUS

## 2015-04-30 MED ORDER — PROPOFOL 10 MG/ML IV BOLUS
INTRAVENOUS | Status: AC
  Filled 2015-04-30: qty 20

## 2015-04-30 SURGICAL SUPPLY — 37 items
ARMBAND PINK RESTRICT EXTREMIT (MISCELLANEOUS) ×3 IMPLANT
CANISTER SUCTION 2500CC (MISCELLANEOUS) ×3 IMPLANT
CANNULA VESSEL 3MM 2 BLNT TIP (CANNULA) ×3 IMPLANT
CLIP TI MEDIUM 6 (CLIP) ×3 IMPLANT
CLIP TI WIDE RED SMALL 6 (CLIP) ×3 IMPLANT
COVER PROBE W GEL 5X96 (DRAPES) ×2 IMPLANT
DECANTER SPIKE VIAL GLASS SM (MISCELLANEOUS) ×3 IMPLANT
ELECT REM PT RETURN 9FT ADLT (ELECTROSURGICAL) ×3
ELECTRODE REM PT RTRN 9FT ADLT (ELECTROSURGICAL) ×1 IMPLANT
GLOVE BIO SURGEON STRL SZ 6.5 (GLOVE) ×1 IMPLANT
GLOVE BIO SURGEON STRL SZ7.5 (GLOVE) ×3 IMPLANT
GLOVE BIO SURGEONS STER SZ 5.5 (GLOVE) ×2 IMPLANT
GLOVE BIO SURGEONS STRL SZ 6.5 (GLOVE) ×1
GLOVE BIOGEL PI IND STRL 6.5 (GLOVE) IMPLANT
GLOVE BIOGEL PI IND STRL 7.0 (GLOVE) IMPLANT
GLOVE BIOGEL PI IND STRL 8 (GLOVE) ×1 IMPLANT
GLOVE BIOGEL PI INDICATOR 6.5 (GLOVE) ×8
GLOVE BIOGEL PI INDICATOR 7.0 (GLOVE) ×2
GLOVE BIOGEL PI INDICATOR 8 (GLOVE) ×2
GLOVE ECLIPSE 6.5 STRL STRAW (GLOVE) ×2 IMPLANT
GLOVE SURG SS PI 6.5 STRL IVOR (GLOVE) ×4 IMPLANT
GOWN STRL REUS W/ TWL LRG LVL3 (GOWN DISPOSABLE) ×3 IMPLANT
GOWN STRL REUS W/TWL LRG LVL3 (GOWN DISPOSABLE) ×9
KIT BASIN OR (CUSTOM PROCEDURE TRAY) ×3 IMPLANT
KIT ROOM TURNOVER OR (KITS) ×3 IMPLANT
LIQUID BAND (GAUZE/BANDAGES/DRESSINGS) ×3 IMPLANT
NS IRRIG 1000ML POUR BTL (IV SOLUTION) ×3 IMPLANT
PACK CV ACCESS (CUSTOM PROCEDURE TRAY) ×3 IMPLANT
PAD ARMBOARD 7.5X6 YLW CONV (MISCELLANEOUS) ×6 IMPLANT
SPONGE SURGIFOAM ABS GEL 100 (HEMOSTASIS) IMPLANT
SUT PROLENE 5 0 C 1 24 (SUTURE) ×2 IMPLANT
SUT PROLENE 6 0 BV (SUTURE) ×7 IMPLANT
SUT VIC AB 3-0 SH 27 (SUTURE) ×6
SUT VIC AB 3-0 SH 27X BRD (SUTURE) ×1 IMPLANT
SUT VICRYL 4-0 PS2 18IN ABS (SUTURE) ×5 IMPLANT
UNDERPAD 30X30 INCONTINENT (UNDERPADS AND DIAPERS) ×3 IMPLANT
WATER STERILE IRR 1000ML POUR (IV SOLUTION) ×3 IMPLANT

## 2015-04-30 NOTE — Anesthesia Preprocedure Evaluation (Addendum)
Anesthesia Evaluation  Patient identified by MRN, date of birth, ID band Patient awake    Reviewed: Allergy & Precautions, NPO status , Patient's Chart, lab work & pertinent test results, reviewed documented beta blocker date and time   History of Anesthesia Complications (+) DIFFICULT AIRWAY and history of anesthetic complications  Airway Mallampati: II  TM Distance: >3 FB Neck ROM: Full    Dental  (+) Dental Advisory Given, Teeth Intact   Pulmonary sleep apnea (does not use CPAP) ,    breath sounds clear to auscultation       Cardiovascular Exercise Tolerance: Poor hypertension, Pt. on medications and Pt. on home beta blockers (-) angina+ CAD, + Cardiac Stents (RCA 03/2014) and +CHF  (-) Past MI  Rhythm:Regular Rate:Normal  DES RCA 03/2014, ECHO 08/2014 60%, EKG long QT   Neuro/Psych PSYCHIATRIC DISORDERS Anxiety negative neurological ROS     GI/Hepatic Neg liver ROS, GERD  Medicated and Controlled,  Endo/Other  diabetes, Type 1, Insulin DependentHypothyroidism Morbid obesity  Renal/GU Dialysis and ESRFRenal disease (last dialysis 04/29/15)S/P transplant, now back on dialysis   K 4.4     Musculoskeletal  (+) Arthritis , Fibromyalgia -  Abdominal (+) + obese,   Peds  Hematology  (+) Blood dyscrasia, anemia , 12.6/37   Anesthesia Other Findings Day of surgery medications reviewed with the patient.  Reproductive/Obstetrics                           Anesthesia Physical Anesthesia Plan  ASA: III  Anesthesia Plan: General   Post-op Pain Management:    Induction: Intravenous  Airway Management Planned: Oral ETT and Video Laryngoscope Planned  Additional Equipment:   Intra-op Plan:   Post-operative Plan: Extubation in OR  Informed Consent: I have reviewed the patients History and Physical, chart, labs and discussed the procedure including the risks, benefits and alternatives for the  proposed anesthesia with the patient or authorized representative who has indicated his/her understanding and acceptance.   Dental advisory given  Plan Discussed with: CRNA  Anesthesia Plan Comments:         Anesthesia Quick Evaluation

## 2015-04-30 NOTE — Progress Notes (Signed)
Pt. Has insulin pump at basal rate 1.5 units/hour. Notified Dr. Gifford Shave. No new orders.

## 2015-04-30 NOTE — OR Nursing (Signed)
Pt arrived to phase II with marginal BP. Dr. Gifford Shave consulted and orders recvd.  Pt continued to experience waning BP; Dr. Gifford Shave at bs frequently to assess and give orders. Pt recovered with fluid admin and stable for d/c home.

## 2015-04-30 NOTE — H&P (Signed)
Brief History and Physical  History of Present Illness  Dale [name] is a 50 y.o. male who presents with chief complaint: ESRD. The patient presents today for F/U after Ligation of right brachiocephalic fistula and excision of aneurysm and placement of Right IJ catheter. He has HD on M-W-F. He had a venogram performed by Dr. Bridgett Larsson secondary to previous UE swelling and to r/o subclavian stenosis. FINDING(S): 12/25/2014 by Dr. Bridgett Larsson 1. Patent left innominate vein, subclavian vein, and axillary vein 2. Patent left basilic vein which appears large enough for use in transposition He has no new medical complaints or medication changes since his last procedure. His cardiologist Dr. [name] does not want him off his Effient until Jan. We will work with him to help coordinate the next procedure.   Past Medical History  Diagnosis Date  . Hypertension   . Anemia of chronic disease   . Cholecystitis     a. 08/27/2011  . Constipation   . Pericardial effusion     a. Small by CT 08/22/11; b. Large by CT 08/27/11  . Inguinal lymphadenopathy     a. bilateral - s/p biopsy 07/2011  . Fibromyalgia   . Chronic diastolic CHF (congestive heart failure) ([place])     a. Echo 3/13: EF 55-60%; b. Echo (11/15): Mild LVH, EF 60-65%, no RWMA, Gr 1 DD, MAC, mild LAE, normal RVF, mild RAE; c. Echo 5/16: severe LVH, EF 55-60%, no RWMA, Gr 1 DD, MAC, mild LAE  . HLD (hyperlipidemia)   . Hypothyroidism   . GERD (gastroesophageal reflux disease)   . CAD (coronary artery disease)     a. Lexiscan Myoview (11/15): Inf, apical cap and apical lateral ischemia, EF 56%, inf HK, Moderate Risk; b. LHC (11/15): mid to dist Dx 80%, mid RCA 99% (functional CTO), dist RCA 80% >> PCI: balloon angioplasty to mid RCA (could not deliver stent) - plan staged PCI with HSRA of RCA; Dx to be tx medically >> PCI: Rotoblator atherectomy/DES to [facility]  . OSA (obstructive  sleep apnea)     adjustable bed  . Shortness of breath dyspnea     too much Fluid  . Pneumonia ~ 2007  . Insulin dependent diabetes mellitus (HCC)     Type I  . Claustrophobia     when things get around his face.   [name] Kitchen ESRD (end stage renal disease) ([place])     a. 1995 s/p cadaveric transplant w/ susbequent failure after 18 yrs; b.Dialysis initiated 07/2011 [facility]  . Constipation   . Arthritis     "hands, right knee" (03/19/2014)  . History of blood transfusion     Past Surgical History  Procedure Laterality Date  . Kidney transplant  04/25/1993  . Cataract extraction w/ intraocular lens implant, bilateral Bilateral 1990's  . Arteriovenous graft placement Left 1993?    forearm  . Arteriovenous graft placement Right 1993?    leg  . Toe amputation Bilateral after 2005  . Below knee leg amputation Left 2010  . Carpal tunnel release Bilateral after 2005  . Shoulder arthroscopy Left ~ 2006    frozen  . Av fistula placement  07/29/2011    Procedure: ARTERIOVENOUS (AV) FISTULA CREATION; Surgeon: Mal Misty, MD; Location: [place]; Service: Vascular; Laterality: Right; Brachial cephalic  . Insertion of dialysis catheter  08/01/2011    Procedure: INSERTION OF DIALYSIS CATHETER; Surgeon: Rosetta Posner, MD; Location: [place]; Service: Vascular; Laterality: Right; insertion of dialysis catheter on  right internal jugular vein  . Lymph node biopsy  08/10/2011    Procedure: LYMPH NODE BIOPSY; Surgeon: Merrie Roof, MD; Location: [place]; Service: General; Laterality: Left; left groin lymph biopsy  . Colonoscopy  08/29/2011    Procedure: COLONOSCOPY; Surgeon: Jerene Bears, MD; Location: [place]; Service: Gastroenterology; Laterality: N/A;  . Appendectomy  ~ 2006  . Below knee leg amputation Left   . Umbilical hernia repair  1990's  . Cardiac  catheterization  03/19/2014  . Av graft placement Left 1993?    "attempted one in my wrist; didn't take"  . Neuroplasty / transposition ulnar nerve at elbow Left after 2005  . Venogram Right 07/28/2011    Procedure: VENOGRAM; Surgeon: Conrad Montgomery, MD; Location: [facility] CATH LAB; Service: Cardiovascular; Laterality: Right;  . Left heart catheterization with coronary angiogram N/A 03/19/2014    Procedure: LEFT HEART CATHETERIZATION WITH CORONARY ANGIOGRAM; Surgeon: Burnell Blanks, MD; Location: [facility] CATH LAB; Service: Cardiovascular; Laterality: N/A;  . Cardiac catheterization  03/19/2014    Procedure: CORONARY BALLOON ANGIOPLASTY; Surgeon: Burnell Blanks, MD; Location: [facility] CATH LAB; Service: Cardiovascular;;  . Coronary angioplasty with stent placement  04/09/2014     ptca/des mid lad   . Angioplasty  04/09/2014    RCA   . Femoral artery repair  04/09/2014    ANGIOSEAL  . Percutaneous coronary rotoblator intervention (pci-r) N/A 04/09/2014    Procedure: PERCUTANEOUS CORONARY ROTOBLATOR INTERVENTION (PCI-R); Surgeon: Burnell Blanks, MD; Location: [facility] CATH LAB; Service: Cardiovascular; Laterality: N/A;  . Revison of arteriovenous fistula Right [id number]    Procedure: PLICATION / REVISION OF ARTERIOVENOUS FISTULA; Surgeon: Angelia Mould, MD; Location: [place]; Service: Vascular; Laterality: Right;  . Peripheral vascular catheterization N/A 12/25/2014    Procedure: Upper Extremity Venography; Surgeon: Conrad Delta, MD; Location: [facility] CV LAB; Service: Cardiovascular; Laterality: N/A;  . Carpal tunnel release Bilateral   . Eye surgery Bilateral     cataract  . Amputation of replicated toes Right   . Vitrectomy Bilateral   . Peritoneal catheter insertion    . Peritoneal catheter removal    . Arteriovenous graft placement Left     Thigh   . Arteriovenous graft removed Left     Thigh  . Av fistula placement Left 01/20/2015    Procedure: LIGATION OF RIGHT ARTERIOVENOUS FISTULA WITH EXCISION OF ANEURYSM; Surgeon: Angelia Mould, MD; Location: [place]; Service: Vascular; Laterality: Left;  . Insertion of dialysis catheter Right 01/20/2015    Procedure: INSERTION OF DIALYSIS CATHETER - RIGHT INTERNAL JUGULAR ; Surgeon: Angelia Mould, MD; Location[facility] OR; Service: Vascular; Laterality: Right;    Social History   Social History  . Marital Status: Married    Spouse Name: N/A  . Number of Children: N/A  . Years of Education: N/A   Occupational History  . Disabled    Social History Main Topics  . Smoking status: Never Smoker   . Smokeless tobacco: Never Used  . Alcohol Use: Yes     Comment: 11/19/14 "might have a drink 2-3 times/year"  . Drug Use: No  . Sexual Activity: Not Currently   Other Topics Concern  . Not on file   Social History Narrative   Lives @ home with his wife in [place].     Family History  Problem Relation Age of Onset  . Malignant hyperthermia Neg Hx   . Heart attack Neg Hx   . Stroke Neg Hx   . Colon  polyps Father   . Diabetes Father   . Hypertension Father   . Breast cancer Maternal Aunt   . Cancer Maternal Aunt     Breast and Bone  . Hypertension Mother   . Diabetes Mother   . Hyperlipidemia Mother     Current Outpatient Prescriptions on File Prior to Visit  Medication Sig Dispense Refill  . allopurinol (ZYLOPRIM) 100 MG tablet Take 200 mg by mouth daily.     [name] Kitchen aspirin 81 MG tablet Take 81 mg by mouth every evening.     [name] Kitchen atorvastatin (LIPITOR) 10 MG tablet Take 10 mg by mouth every evening.     . calcium acetate (PHOSLO) 667 MG capsule Take 667-1,334 mg by mouth See admin instructions. Take 1,334 mg (2  capsules) with meals and 667 (1 capsule) with snacks    . diphenhydrAMINE (BENADRYL) 25 mg capsule Take 1-2 capsules (25-50 mg total) by mouth every 6 (six) hours as needed for itching. 30 capsule 0  . docusate sodium (COLACE) 100 MG capsule Take 200 mg by mouth 3 (three) times daily.     [name] Kitchen EFFIENT 10 MG TABS tablet TAKE 1 TABLET BY MOUTH EVERY DAY 30 tablet 0  . furosemide (LASIX) 80 MG tablet Take 80 mg by mouth 2 (two) times daily.   98  . GLUCAGON EMERGENCY 1 MG injection Inject 1 mg into the muscle daily as needed (low blood sugar).     . hydrocortisone 2.5 % cream APPLY 2-3 TIMES DAILY AS NEEDED TO AFFECTED AREA(S).  1  . HYDROmorphone (DILAUDID) 2 MG tablet Take 1 tablet (2 mg total) by mouth 3 (three) times daily as needed for severe pain. 20 tablet 0  . Insulin Human (INSULIN PUMP) 100 unit/ml SOLN Inject into the skin continuous. Humalog    . levothyroxine (SYNTHROID, LEVOTHROID) 100 MCG tablet Take 100 mcg by mouth daily before breakfast.    . loratadine (CLARITIN) 10 MG tablet Take 10 mg by mouth daily.    . metoCLOPramide (REGLAN) 5 MG tablet Take 5 mg by mouth 4 (four) times daily - after meals and at bedtime.    . metoprolol succinate (TOPROL-XL) 25 MG 24 hr tablet Take 0.5 tablets (12.5 mg total) by mouth as directed. Take 12.5 mg on Tu, Th, Sat and Sun; HOLD on dialysis days M, W, F (Patient taking differently: Take 12.5 mg by mouth daily. )    . multivitamin (RENA-VIT) TABS tablet Take 1 tablet by mouth daily.    . nitroGLYCERIN (NITROSTAT) 0.4 MG SL tablet Place 0.4 mg under the tongue every 5 (five) minutes as needed for chest pain.     . promethazine (PHENERGAN) 25 MG tablet Take 1 tablet (25 mg total) by mouth every 6 (six) hours as needed for nausea or vomiting. For nausea 30 tablet 0  . ranitidine (ZANTAC) 150 MG tablet Take 150 mg by mouth 2 (two) times daily.  4  . rOPINIRole (REQUIP) 2 MG  tablet Take 2 mg by mouth daily with supper.     . sevelamer carbonate (RENVELA) 800 MG tablet Take 1,600 mg by mouth 3 (three) times daily with meals.     Current Facility-Administered Medications on File Prior to Visit  Medication Dose Route Frequency Provider Last Rate Last Dose  . 0.9 % sodium chloride infusion  Intravenous Continuous Angelia Mould, MD    . Chlorhexidine Gluconate Cloth 2 % PADS 6 each 6 each Topical Once Angelia Mould, MD  Allergies  Allergen Reactions  . Morphine And Related Hives and Nausea Only  . Amoxicillin Other (See Comments)    Reaction unknown  . Ciprofloxacin Other (See Comments)    Reaction unknown  . Clindamycin/Lincomycin Other (See Comments)    Reaction unknown  . Codeine Other (See Comments)    Reaction unknown. But pt has tolerated hydrocodone in Vicodin and Norco in the past  . Contrast Media [Iodinated Diagnostic Agents] Rash  . Doxycycline Rash  . Hydrocodone Rash  . Levofloxacin Other (See Comments)    Reaction unknown  . Oxycodone Rash  . Plavix [Clopidogrel Bisulfate] Rash  . Rocephin [Ceftriaxone] Itching  . Vasotec Cough    Review of Systems: As listed above, otherwise negative.  Physical Examination  Filed Vitals:   02/18/15 1329  BP: 133/63  Pulse: 86  Height: 5\' 8"  (1.727 m)  Weight: 274 lb (124.286 kg)  SpO2: 96%    General: A&O x 3, WDWN  Pulmonary: Sym exp, good air movt, non labored breathing Cardiac: RRR, Nl S1, S2, no Murmurs, rubs or gallops, S3/S4, Irregularly, irregular rhythm and rate  Gastrointestinal: soft, NTND  Musculoskeletal: M/S 5/5 grossly bilateral UE. Decreased edema in the right UE since surgery with well healed incisions.   Palpable radial pulse left UR min. Edema in bil. UE.   Vein mapping 02/13/2015  Shows acceptable left basilic vein [id number] cm  diameter  Laboratory See iStat  Medical Decision Making  Jafeth Mcgary is a 50 y.o. male who presents S/P right IJ and ligationof right brachiocephalic fistula and excision of aneurysm. Dr. Scot Dock is recommending left basilic vein transposition verses graft placement Jan 08/2015 with assistance from Dr. [name] to come off his Effient.   Theda Sers, EMMA [facility] PA-C [facility] [place] The patient was seen in clinic today buy Dr. Scot Dock   02/18/2015, 1:57 PM  Agree with above. His swelling in the right arm is better now that his fistula has been ligated. He's had a previous left forearm AV graft. Based on a previous vein map in October 2016 he appeared to have an adequate upper arm cephalic vein for fistula or possibly a basilic vein transposition. He did undergo a venogram which showed no evidence of central venous stenosis on the left. Once he can come off his Effient in late December we can schedule him for new access. First choice would be a brachial cephalic fistula if possible. Second choice would be a basilic vein transposition. Third choice would be an upper arm graft.   [name], MD, FACS [facility] [phone] Office: [place]

## 2015-04-30 NOTE — Anesthesia Postprocedure Evaluation (Signed)
Anesthesia Post Note  Patient: Errick Brodt  Procedure(s) Performed: Procedure(s) (LRB): CREATION OF LEFT ARM BRACHIO-CEPHALIC ARTERIOVENOUS (AV) FISTULA  (Left)  Patient location during evaluation: PACU Anesthesia Type: General Level of consciousness: awake and alert, awake, oriented and patient cooperative Pain management: pain level controlled Vital Signs Assessment: post-procedure vital signs reviewed and stable Respiratory status: spontaneous breathing, nonlabored ventilation, respiratory function stable and patient connected to nasal cannula oxygen Cardiovascular status: blood pressure returned to baseline and stable Postop Assessment: no signs of nausea or vomiting Anesthetic complications: no Comments: Required 500cc total of albumin in PACU for persistent hypotension.    Last Vitals:  Filed Vitals:   04/30/15 1715 04/30/15 1730  BP: 123/54 118/59  Pulse: 78 81  Temp:    Resp: 25 18    Last Pain:  Filed Vitals:   04/30/15 1810  PainSc: 2                  Catalina Gravel

## 2015-04-30 NOTE — Transfer of Care (Signed)
Immediate Anesthesia Transfer of Care Note  Patient: Dale [name]  Procedure(s) Performed: Procedure(s): CREATION OF LEFT ARM BRACHIO-CEPHALIC ARTERIOVENOUS (AV) FISTULA  (Left)  Patient Location: PACU  Anesthesia Type:General  Level of Consciousness: awake, alert , oriented and patient cooperative  Airway & Oxygen Therapy: Patient Spontanous Breathing and Patient connected to nasal cannula oxygen  Post-op Assessment: Report given to RN and Post -op Vital signs reviewed and stable  Post vital signs: Reviewed and stable  Last Vitals:  Filed Vitals:   04/30/15 0820  BP: 110/46  Pulse: 78  Temp: 36.7 C  Resp: 20    Complications: No apparent anesthesia complications

## 2015-04-30 NOTE — Discharge Instructions (Signed)
° ° °  04/30/2015 Dale [name] [id number] [birth year]  Surgeon(s): Angelia Mould, MD  Procedure(s): CREATION OF LEFT ARM BRACHIO-CEPHALIC ARTERIOVENOUS (AV) FISTULA   x Do not stick fistula for 12 weeks

## 2015-04-30 NOTE — Op Note (Signed)
    NAME: Dale [name]   MRN: [id number] DOB: 11/14/1965    DATE OF OPERATION: 04/30/2015  PREOP DIAGNOSIS: Stage V chronic kidney disease  POSTOP DIAGNOSIS: Same  PROCEDURE: Left brachial cephalic AV fistula  SURGEON: Judeth Cornfield. Scot Dock, MD, FACS  ASSIST: Leontine Locket, PA  ANESTHESIA: local with sedation   EBL: minimal  INDICATIONS: Dale [name] is a 50 y.o. male who presents for new access.  FINDINGS: the upper arm cephalic vein was proximally 4 mm.  TECHNIQUE: The patient was taken to the operating room and received a general anesthetic. The left upper extremity was prepped and draped in the usual sterile fashion. A transverse incision was made just above the cubital level of the cephalic vein was dissected free. The brachial artery was dissected free beneath the fascia. There was a fairly far distance to the brachial artery and therefore had to mobilize the cephalic vein fairly far distally before I encountered guard tissue from his previous AV graft in the forearm. The patient was heparinized. The vein was ligated distally and irrigated up with heparinized saline. The brachial artery was clamped proximally and distally and a longitudinal arteriotomy was made. The vein was sewn end-to-side to the artery using continuous 6-0 Prolene suture. At the completion was good thrill in the fistula. The vein was somewhat deep and beneath the fascia by duplex and therefore elected to make an incision over the vein further up the arm to release the vein from the fascia. There was also 2 small branches which were divided between ties. Distally there was some venous oozing below the antecubital space and a small incision was made over this area so this could be identified and then repaired with a 6-0 Prolene suture. An old venous anastomosis was excised also. The heparin was partially reversed with protamine. The incision below the antecubital level and the more central incision were both closed  with a 4-0 subcuticular stitch. The antecubital incision was closed with a deep layer of 3-0 Vicryl and the skin closed with 4-0 Vicryl. Liquid band was applied. The patient tolerated the procedure well and was transferred to the recovery room in stable condition. All needle and sponge counts were correct.  [name], MD, FACS [facility]  DATE OF DICTATION:   04/30/2015

## 2015-05-01 ENCOUNTER — Encounter (HOSPITAL_COMMUNITY): Payer: Self-pay | Admitting: Vascular Surgery

## 2015-05-01 ENCOUNTER — Telehealth: Payer: Self-pay | Admitting: Vascular Surgery

## 2015-05-01 NOTE — Telephone Encounter (Signed)
-----   Message from Mena Goes, RN sent at 04/30/2015 12:15 PM EST ----- Regarding: schedule   ----- Message -----    From: Gabriel Earing, PA-C    Sent: 04/30/2015  12:09 PM      To: [facility] Charge Pool  S/p left BC AVF 04/30/15.   F/u in 6 weeks with Dr. Scot Dock with duplex.  Thanks

## 2015-05-01 NOTE — Telephone Encounter (Signed)
Spoke with Fritz Pickerel, dpm

## 2015-05-23 IMAGING — CR DG CHEST 2V
2 series · 2 of 2 positions shown · non-contrast
Comparison: 11/04/2013.

CLINICAL DATA: Shortness of breath.

EXAM:
CHEST  2 VIEW

[w chest pa]
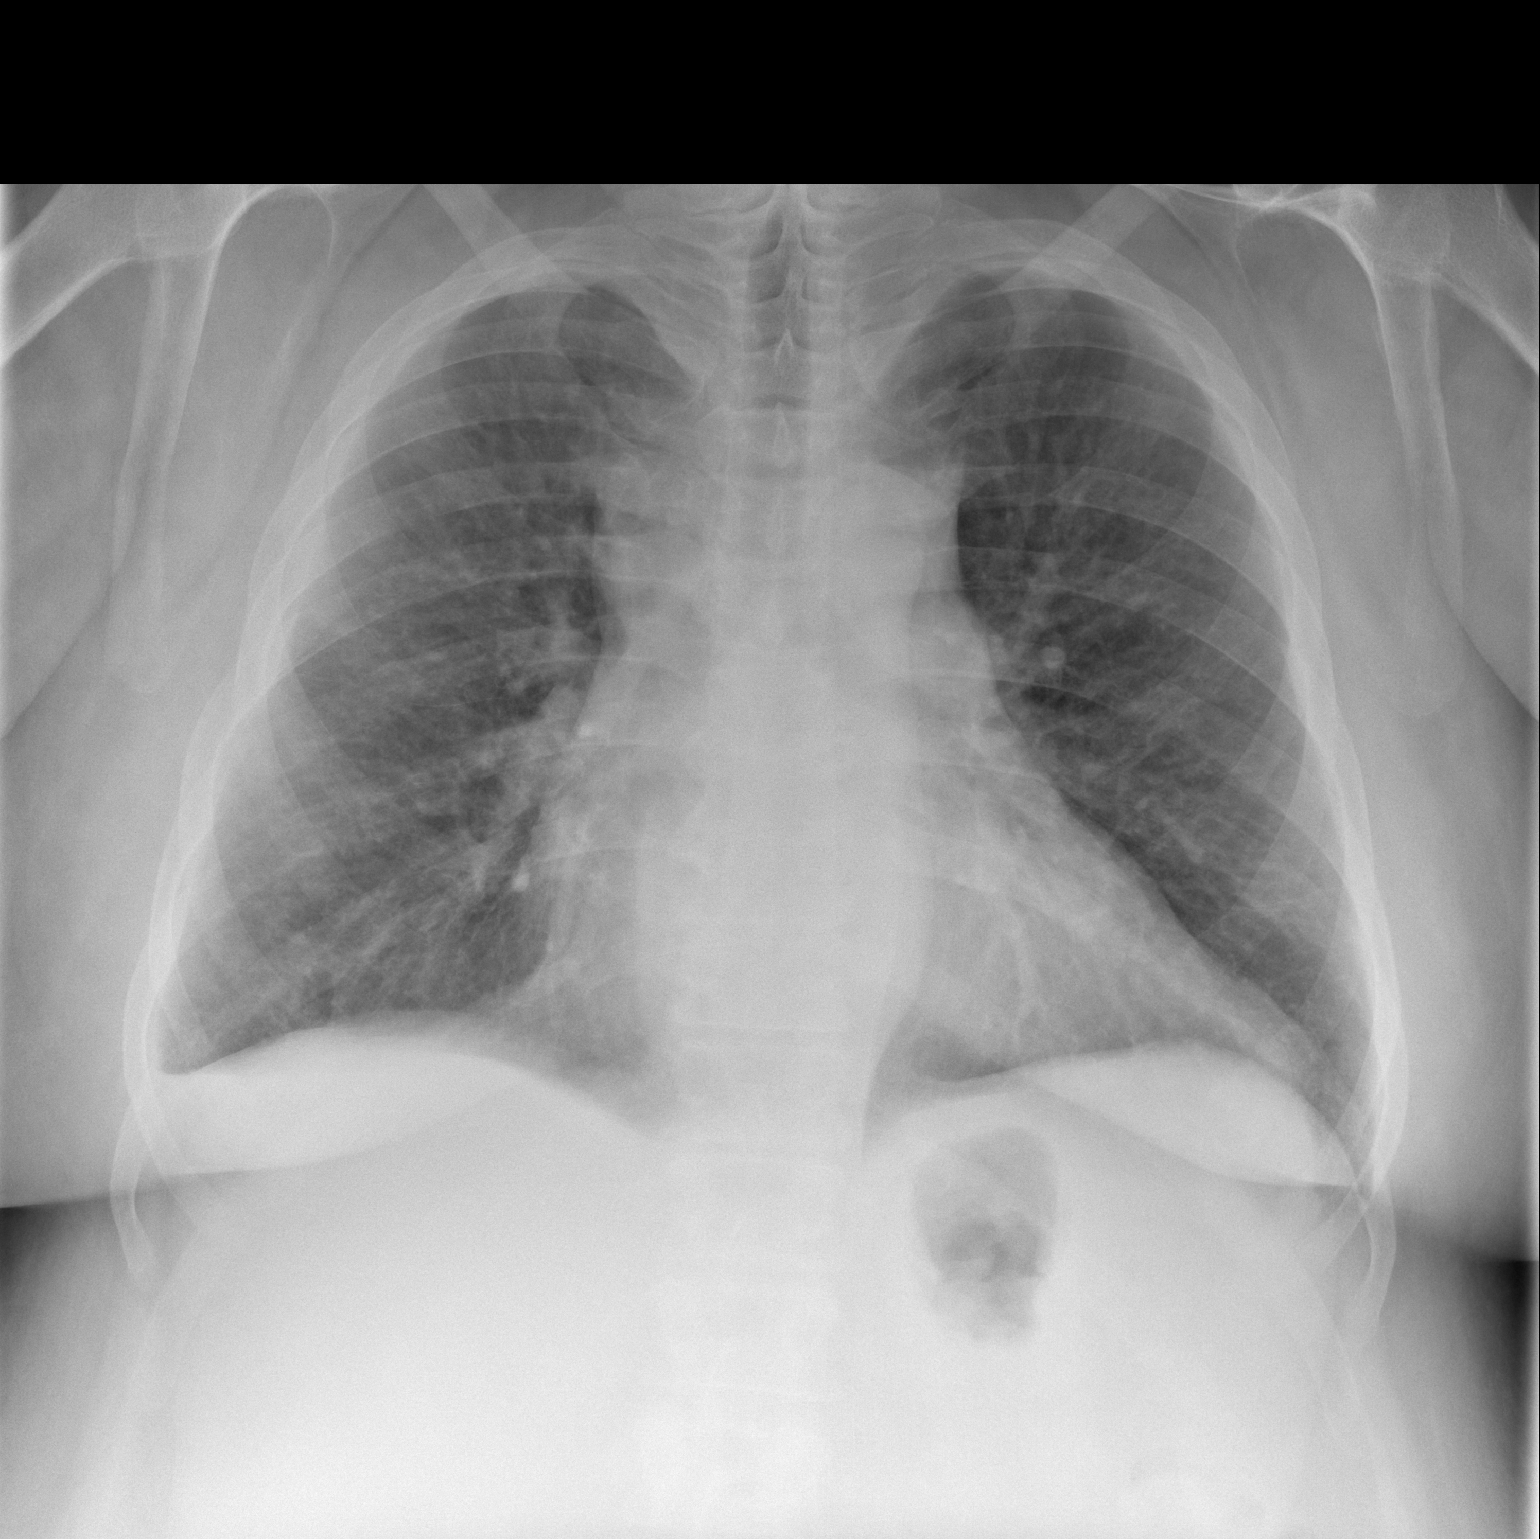

[w chest lat]
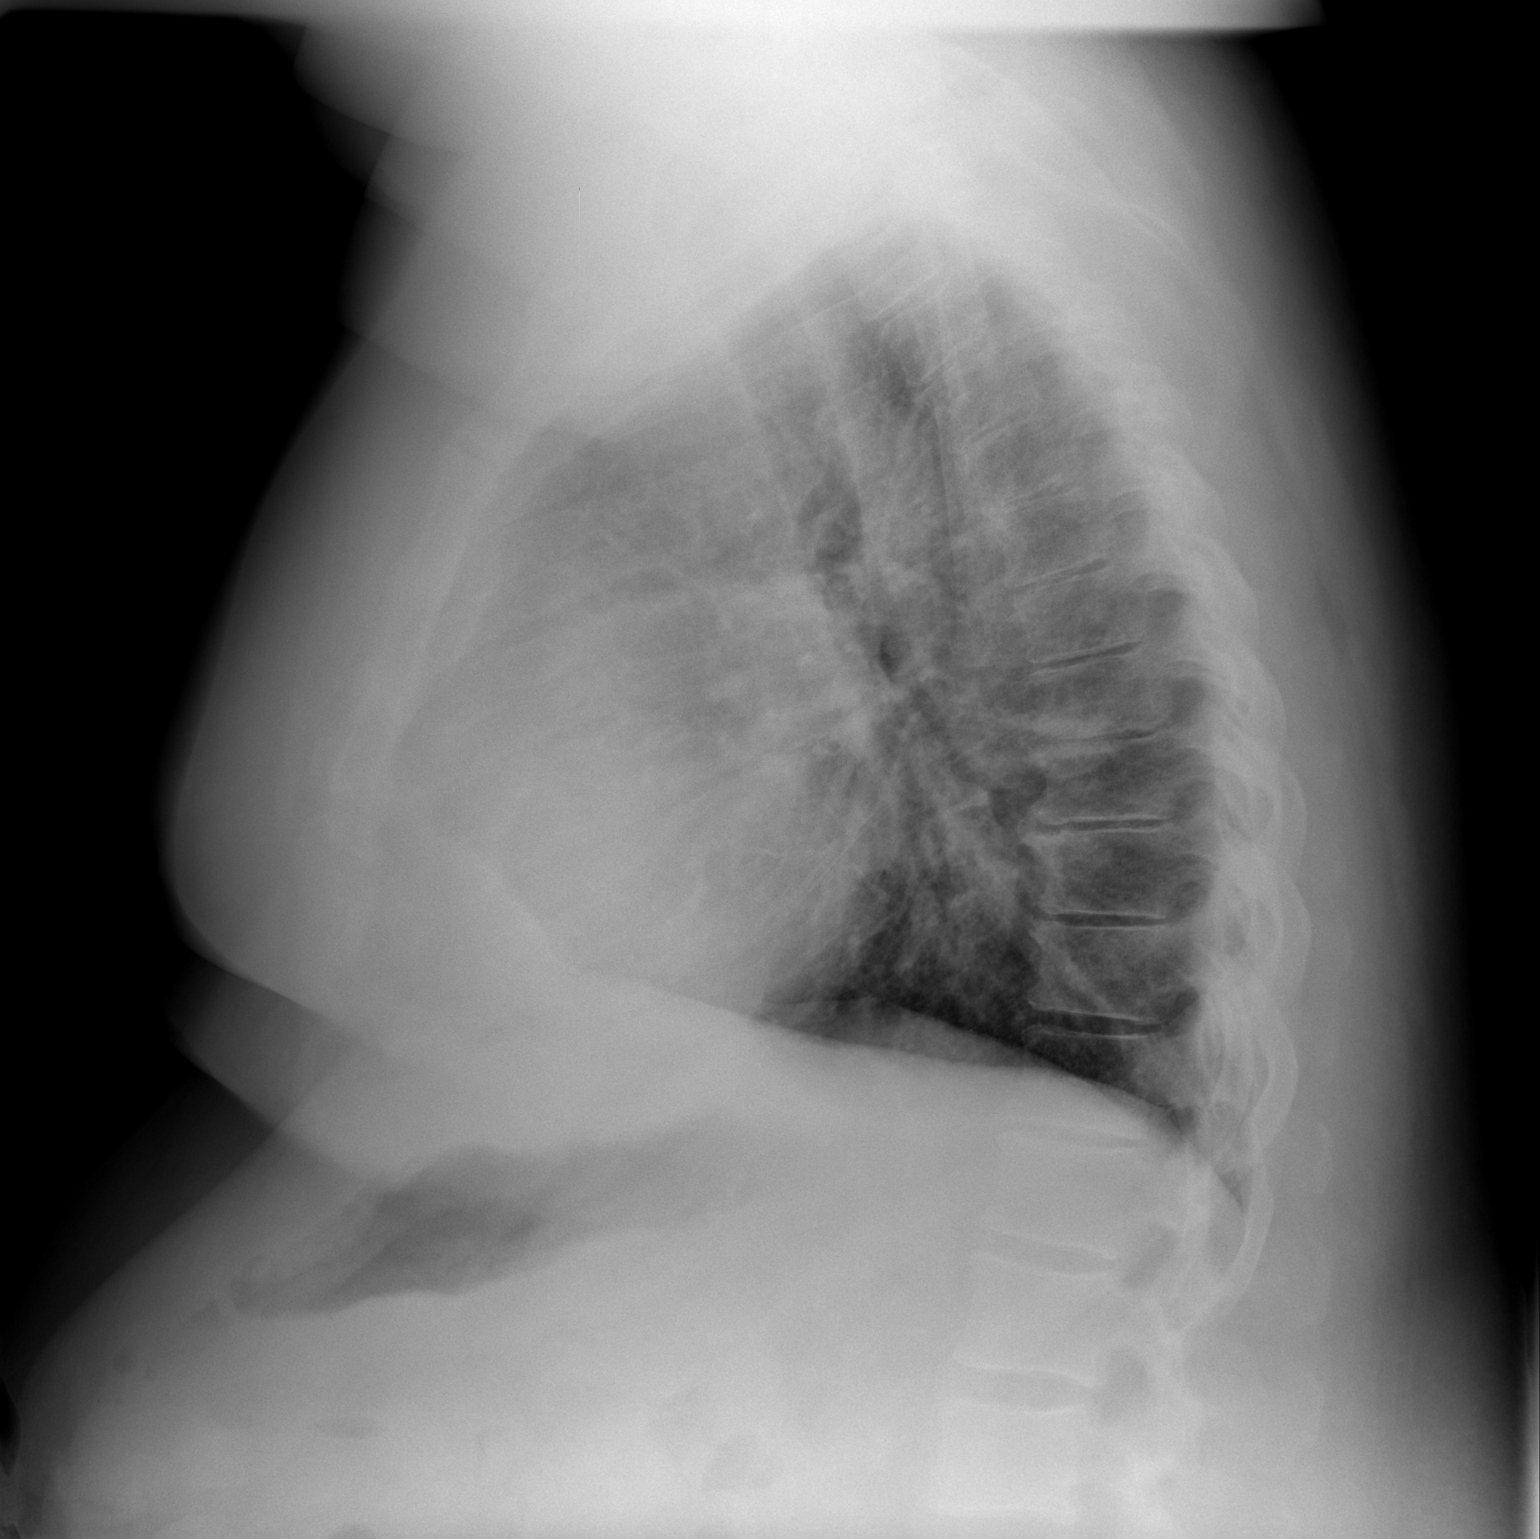

[2 of 2 positions shown; findings below may reference images not displayed]

FINDINGS: Cardiomegaly with mild pulmonary vascular prominence and
interstitial prominence. Small right pleural effusion. These
findings are consistent with congestive heart failure. No focal
alveolar infiltrate. No pneumothorax. Degenerative changes thoracic
spine .
IMPRESSION: Congestive heart failure with pulmonary interstitial edema and small
right pleural effusion .

## 2015-05-27 DIAGNOSIS — D689 Coagulation defect, unspecified: Secondary | ICD-10-CM | POA: Diagnosis not present

## 2015-05-27 DIAGNOSIS — N2581 Secondary hyperparathyroidism of renal origin: Secondary | ICD-10-CM | POA: Diagnosis not present

## 2015-05-27 DIAGNOSIS — D631 Anemia in chronic kidney disease: Secondary | ICD-10-CM | POA: Diagnosis not present

## 2015-05-27 DIAGNOSIS — N186 End stage renal disease: Secondary | ICD-10-CM | POA: Diagnosis not present

## 2015-06-02 DIAGNOSIS — N433 Hydrocele, unspecified: Secondary | ICD-10-CM | POA: Diagnosis not present

## 2015-06-04 ENCOUNTER — Encounter: Payer: Self-pay | Admitting: Family

## 2015-06-10 ENCOUNTER — Ambulatory Visit (HOSPITAL_COMMUNITY)
Admission: RE | Admit: 2015-06-10 | Discharge: 2015-06-10 | Disposition: A | Discharge status: Home / Self Care | Payer: Medicare Other | Source: Ambulatory Visit | Attending: Vascular Surgery | Admitting: Vascular Surgery

## 2015-06-10 ENCOUNTER — Encounter: Payer: Self-pay | Admitting: Vascular Surgery

## 2015-06-10 ENCOUNTER — Ambulatory Visit (INDEPENDENT_AMBULATORY_CARE_PROVIDER_SITE_OTHER): Payer: Medicare Other | Admitting: Vascular Surgery

## 2015-06-10 VITALS — BP 100/44 | HR 93 | Temp 98.2°F | Resp 16 | Ht 69.0 in | Wt 281.0 lb

## 2015-06-10 DIAGNOSIS — Z794 Long term (current) use of insulin: Secondary | ICD-10-CM | POA: Diagnosis not present

## 2015-06-10 DIAGNOSIS — I132 Hypertensive heart and chronic kidney disease with heart failure and with stage 5 chronic kidney disease, or end stage renal disease: Secondary | ICD-10-CM | POA: Diagnosis not present

## 2015-06-10 DIAGNOSIS — Z48812 Encounter for surgical aftercare following surgery on the circulatory system: Secondary | ICD-10-CM | POA: Diagnosis not present

## 2015-06-10 DIAGNOSIS — I5032 Chronic diastolic (congestive) heart failure: Secondary | ICD-10-CM | POA: Insufficient documentation

## 2015-06-10 DIAGNOSIS — N186 End stage renal disease: Secondary | ICD-10-CM

## 2015-06-10 DIAGNOSIS — E1022 Type 1 diabetes mellitus with diabetic chronic kidney disease: Secondary | ICD-10-CM | POA: Insufficient documentation

## 2015-06-10 DIAGNOSIS — E785 Hyperlipidemia, unspecified: Secondary | ICD-10-CM | POA: Insufficient documentation

## 2015-06-10 NOTE — Addendum Note (Signed)
Addended by: Dorthula Rue L on: 06/10/2015 04:34 PM   Modules accepted: Orders

## 2015-06-10 NOTE — Progress Notes (Signed)
Patient name: Dale [name] MRN: [id number] DOB: January 09, 1966 Sex: male  REASON FOR VISIT: Follow up after left upper arm fistula  HPI: Dale [name] is a 50 y.o. male who had a left brachiocephalic AV fistula placed on 04/30/2015. He had a 4 mm upper arm cephalic vein. He comes in for a 6 week follow up visit. He has no specific complaints. He has been using his catheter for dialysis.   Current Outpatient Prescriptions  Medication Sig Dispense Refill  . allopurinol (ZYLOPRIM) 100 MG tablet Take 200 mg by mouth daily.     [name] Kitchen amLODipine (NORVASC) 10 MG tablet Take 1 tablet (10 mg total) by mouth daily. 30 tablet 0  . aspirin EC 81 MG tablet Take 81 mg by mouth at bedtime.    [name] Kitchen atorvastatin (LIPITOR) 40 MG tablet Take 40 mg by mouth at bedtime.  0  . calcium acetate (PHOSLO) 667 MG capsule Take 667-1,334 mg by mouth See admin instructions. 3Take 2 capsules (1334 mg) by mouth with meals and 2 capsule (667 mg) with snacks    . diphenhydrAMINE (BENADRYL) 25 mg capsule Take 1-2 capsules (25-50 mg total) by mouth every 6 (six) hours as needed for itching. (Patient taking differently: Take 25-50 mg by mouth every 6 (six) hours as needed for itching or sleep. ) 30 capsule 0  . docusate sodium (COLACE) 100 MG capsule Take 200 mg by mouth 3 (three) times daily.     . fenofibrate (TRICOR) 48 MG tablet Take 1 tablet (48 mg total) by mouth daily. 30 tablet 0  . fluticasone (FLONASE) 50 MCG/ACT nasal spray Place 2 sprays into both nostrils 2 (two) times daily.     [name] Kitchen gabapentin (NEURONTIN) 100 MG capsule Take 300 mg by mouth as needed.    [name] Kitchen GLUCAGON EMERGENCY 1 MG injection Inject 1 mg into the muscle daily as needed (low blood sugar).     . hydrocortisone 2.5 % cream Apply 1 application topically 4 (four) times daily as needed (itching).    [name] Kitchen HYDROmorphone (DILAUDID) 2 MG tablet Take 1 tablet (2 mg total) by mouth 3 (three) times daily as needed for severe pain. (Patient taking differently: Take 2 mg by  mouth 2 (two) times daily. Take 1 tablet (2 mg) by mouth daily at bedtime, may take an additional dose during the day as needed for severe pain) 20 tablet 0  . Insulin Human (INSULIN PUMP) 100 unit/ml SOLN Inject into the skin continuous. Humalog    . levothyroxine (SYNTHROID, LEVOTHROID) 100 MCG tablet Take 100 mcg by mouth at bedtime.     [name] Kitchen loratadine (CLARITIN) 10 MG tablet Take 10 mg by mouth daily.    . metoCLOPramide (REGLAN) 5 MG tablet Take 5 mg by mouth 4 (four) times daily - after meals and at bedtime.    . metoprolol succinate (TOPROL-XL) 50 MG 24 hr tablet Take 1 tablet (50 mg total) by mouth daily. Take with or immediately following a meal. 30 tablet 0  . multivitamin (RENA-VIT) TABS tablet Take 1 tablet by mouth daily.    . nitroGLYCERIN (NITROSTAT) 0.4 MG SL tablet Place 0.4 mg under the tongue every 5 (five) minutes as needed for chest pain.     . promethazine (PHENERGAN) 25 MG tablet Take 1 tablet (25 mg total) by mouth every 6 (six) hours as needed for nausea or vomiting. For nausea 30 tablet 0  . ranitidine (ZANTAC) 150 MG tablet Take 150 mg by mouth 2 (two)  times daily.  4  . rOPINIRole (REQUIP) 2 MG tablet Take 2 mg by mouth daily with supper.     . sevelamer carbonate (RENVELA) 800 MG tablet Take 1,600 mg by mouth 3 (three) times daily with meals.    . furosemide (LASIX) 80 MG tablet Take 80 mg by mouth 2 (two) times daily. Reported on 06/10/2015  98  . traMADol (ULTRAM) 50 MG tablet Take 1 tablet (50 mg total) by mouth every 6 (six) hours as needed. (Patient not taking: Reported on 06/10/2015) 20 tablet 0   No current facility-administered medications for this visit.   Facility-Administered Medications Ordered in Other Visits  Medication Dose Route Frequency Provider Last Rate Last Dose  . 0.9 %  sodium chloride infusion   Intravenous Continuous Angelia Mould, MD      . Chlorhexidine Gluconate Cloth 2 % PADS 6 each  6 each Topical Once Angelia Mould, MD         REVIEW OF SYSTEMS:  [X]  denotes positive finding, [ ]  denotes negative finding Cardiac  Comments:  Chest pain or chest pressure:    Shortness of breath upon exertion:    Short of breath when lying flat:    Irregular heart rhythm:    Constitutional    Fever or chills:      PHYSICAL EXAM: Filed Vitals:   06/10/15 1508  BP: 100/44  Pulse: 93  Temp: 98.2 F (36.8 C)  TempSrc: Oral  Resp: 16  Height: 5\' 9"  (1.753 m)  Weight: 281 lb (127.461 kg)  SpO2: 98%    GENERAL: The patient is a well-nourished male, in no acute distress. The vital signs are documented above. CARDIOVASCULAR: There is a regular rate and rhythm. PULMONARY: There is good air exchange bilaterally without wheezing or rales. His left brachiocephalic fistula has a good thrill and bruit. His incisions are healing nicely.  DUPLEX LEFT UPPER ARM AV FISTULA: I have independently interpreted his duplex of his left upper arm fistula. Diameters of the fistula range from 0.57-0.73 cm.  MEDICAL ISSUES:  STAGE V CHRONIC KIDNEY DISEASE:  This fistula appears to be maturing reasonably well. It is deeper in the upper part of the arm however I think he should have adequate length above proximal anastomosis for cannulation once this has matured further. I'll see him back with a duplex in 6 weeks and hopefully at that time it will be ready for cannulation.  [name] [facility] [place]: [phone]

## 2015-06-13 DIAGNOSIS — E1022 Type 1 diabetes mellitus with diabetic chronic kidney disease: Secondary | ICD-10-CM | POA: Diagnosis not present

## 2015-06-18 DIAGNOSIS — M1711 Unilateral primary osteoarthritis, right knee: Secondary | ICD-10-CM | POA: Diagnosis not present

## 2015-06-23 DIAGNOSIS — T8612 Kidney transplant failure: Secondary | ICD-10-CM | POA: Diagnosis not present

## 2015-06-23 DIAGNOSIS — N186 End stage renal disease: Secondary | ICD-10-CM | POA: Diagnosis not present

## 2015-06-23 DIAGNOSIS — Z992 Dependence on renal dialysis: Secondary | ICD-10-CM | POA: Diagnosis not present

## 2015-06-24 DIAGNOSIS — D689 Coagulation defect, unspecified: Secondary | ICD-10-CM | POA: Diagnosis not present

## 2015-06-24 DIAGNOSIS — E109 Type 1 diabetes mellitus without complications: Secondary | ICD-10-CM | POA: Diagnosis not present

## 2015-06-24 DIAGNOSIS — N186 End stage renal disease: Secondary | ICD-10-CM | POA: Diagnosis not present

## 2015-06-24 DIAGNOSIS — D631 Anemia in chronic kidney disease: Secondary | ICD-10-CM | POA: Diagnosis not present

## 2015-06-25 DIAGNOSIS — E1065 Type 1 diabetes mellitus with hyperglycemia: Secondary | ICD-10-CM | POA: Diagnosis not present

## 2015-07-02 DIAGNOSIS — E1059 Type 1 diabetes mellitus with other circulatory complications: Secondary | ICD-10-CM | POA: Diagnosis not present

## 2015-07-02 DIAGNOSIS — E1040 Type 1 diabetes mellitus with diabetic neuropathy, unspecified: Secondary | ICD-10-CM | POA: Diagnosis not present

## 2015-07-20 DIAGNOSIS — M19042 Primary osteoarthritis, left hand: Secondary | ICD-10-CM | POA: Diagnosis not present

## 2015-07-20 DIAGNOSIS — M654 Radial styloid tenosynovitis [de Quervain]: Secondary | ICD-10-CM | POA: Diagnosis not present

## 2015-07-20 DIAGNOSIS — M79645 Pain in left finger(s): Secondary | ICD-10-CM | POA: Diagnosis not present

## 2015-07-20 DIAGNOSIS — M1812 Unilateral primary osteoarthritis of first carpometacarpal joint, left hand: Secondary | ICD-10-CM | POA: Diagnosis not present

## 2015-07-24 DIAGNOSIS — N186 End stage renal disease: Secondary | ICD-10-CM | POA: Diagnosis not present

## 2015-07-24 DIAGNOSIS — Z992 Dependence on renal dialysis: Secondary | ICD-10-CM | POA: Diagnosis not present

## 2015-07-24 DIAGNOSIS — T8612 Kidney transplant failure: Secondary | ICD-10-CM | POA: Diagnosis not present

## 2015-07-27 DIAGNOSIS — D689 Coagulation defect, unspecified: Secondary | ICD-10-CM | POA: Diagnosis not present

## 2015-07-27 DIAGNOSIS — N2581 Secondary hyperparathyroidism of renal origin: Secondary | ICD-10-CM | POA: Diagnosis not present

## 2015-07-27 DIAGNOSIS — D631 Anemia in chronic kidney disease: Secondary | ICD-10-CM | POA: Diagnosis not present

## 2015-07-27 DIAGNOSIS — D509 Iron deficiency anemia, unspecified: Secondary | ICD-10-CM | POA: Diagnosis not present

## 2015-07-27 DIAGNOSIS — N186 End stage renal disease: Secondary | ICD-10-CM | POA: Diagnosis not present

## 2015-07-27 DIAGNOSIS — E109 Type 1 diabetes mellitus without complications: Secondary | ICD-10-CM | POA: Diagnosis not present

## 2015-07-28 ENCOUNTER — Encounter: Payer: Self-pay | Admitting: Vascular Surgery

## 2015-08-03 DIAGNOSIS — H16222 Keratoconjunctivitis sicca, not specified as Sjogren's, left eye: Secondary | ICD-10-CM | POA: Diagnosis not present

## 2015-08-05 ENCOUNTER — Ambulatory Visit (HOSPITAL_COMMUNITY)
Admission: RE | Admit: 2015-08-05 | Discharge: 2015-08-05 | Disposition: A | Discharge status: Home / Self Care | Payer: Medicare Other | Source: Ambulatory Visit | Attending: Vascular Surgery | Admitting: Vascular Surgery

## 2015-08-05 ENCOUNTER — Encounter: Payer: Self-pay | Admitting: Vascular Surgery

## 2015-08-05 ENCOUNTER — Ambulatory Visit (INDEPENDENT_AMBULATORY_CARE_PROVIDER_SITE_OTHER): Payer: Medicare Other | Admitting: Vascular Surgery

## 2015-08-05 VITALS — BP 127/55 | HR 95 | Temp 97.8°F | Ht 69.0 in | Wt 281.0 lb

## 2015-08-05 DIAGNOSIS — N186 End stage renal disease: Secondary | ICD-10-CM | POA: Diagnosis not present

## 2015-08-05 DIAGNOSIS — E1022 Type 1 diabetes mellitus with diabetic chronic kidney disease: Secondary | ICD-10-CM | POA: Insufficient documentation

## 2015-08-05 DIAGNOSIS — I5032 Chronic diastolic (congestive) heart failure: Secondary | ICD-10-CM | POA: Diagnosis not present

## 2015-08-05 DIAGNOSIS — Z992 Dependence on renal dialysis: Secondary | ICD-10-CM

## 2015-08-05 DIAGNOSIS — K219 Gastro-esophageal reflux disease without esophagitis: Secondary | ICD-10-CM | POA: Diagnosis not present

## 2015-08-05 DIAGNOSIS — E785 Hyperlipidemia, unspecified: Secondary | ICD-10-CM | POA: Insufficient documentation

## 2015-08-05 DIAGNOSIS — G4733 Obstructive sleep apnea (adult) (pediatric): Secondary | ICD-10-CM | POA: Insufficient documentation

## 2015-08-05 DIAGNOSIS — I132 Hypertensive heart and chronic kidney disease with heart failure and with stage 5 chronic kidney disease, or end stage renal disease: Secondary | ICD-10-CM | POA: Insufficient documentation

## 2015-08-05 NOTE — Progress Notes (Signed)
Patient name: Dale [name] MRN: [id number] DOB: July 03, 1965 Sex: male  REASON FOR VISIT: Follow up after left brachiocephalic fistula  HPI: Dale [name] is a 50 y.o. male had a left brachiocephalic fistula placed on 04/30/2015. He comes in for a 3 month follow up visit. He has no specific complaints. They have been using his catheter for dialysis. He has no pain or paresthesias in his left upper extremity.  Current Outpatient Prescriptions  Medication Sig Dispense Refill  . allopurinol (ZYLOPRIM) 100 MG tablet Take 200 mg by mouth daily.     [name] Kitchen aspirin EC 81 MG tablet Take 81 mg by mouth at bedtime.    [name] Kitchen atorvastatin (LIPITOR) 40 MG tablet Take 40 mg by mouth at bedtime.  0  . calcium acetate (PHOSLO) 667 MG capsule Take 667-1,334 mg by mouth See admin instructions. 3Take 2 capsules (1334 mg) by mouth with meals and 2 capsule (667 mg) with snacks    . diphenhydrAMINE (BENADRYL) 25 mg capsule Take 1-2 capsules (25-50 mg total) by mouth every 6 (six) hours as needed for itching. (Patient taking differently: Take 25-50 mg by mouth every 6 (six) hours as needed for itching or sleep. ) 30 capsule 0  . docusate sodium (COLACE) 100 MG capsule Take 200 mg by mouth 3 (three) times daily.     . fenofibrate (TRICOR) 48 MG tablet Take 1 tablet (48 mg total) by mouth daily. 30 tablet 0  . fluticasone (FLONASE) 50 MCG/ACT nasal spray Place 2 sprays into both nostrils 2 (two) times daily.     [name] Kitchen gabapentin (NEURONTIN) 100 MG capsule Take 300 mg by mouth as needed.    [name] Kitchen GLUCAGON EMERGENCY 1 MG injection Inject 1 mg into the muscle daily as needed (low blood sugar).     . hydrocortisone 2.5 % cream Apply 1 application topically 4 (four) times daily as needed (itching).    [name] Kitchen HYDROmorphone (DILAUDID) 2 MG tablet Take 1 tablet (2 mg total) by mouth 3 (three) times daily as needed for severe pain. (Patient taking differently: Take 2 mg by mouth 2 (two) times daily. Take 1 tablet (2 mg) by mouth daily at bedtime,  may take an additional dose during the day as needed for severe pain) 20 tablet 0  . Insulin Human (INSULIN PUMP) 100 unit/ml SOLN Inject into the skin continuous. Humalog    . levothyroxine (SYNTHROID, LEVOTHROID) 100 MCG tablet Take 100 mcg by mouth at bedtime.     [name] Kitchen loratadine (CLARITIN) 10 MG tablet Take 10 mg by mouth daily.    . metoCLOPramide (REGLAN) 5 MG tablet Take 5 mg by mouth 4 (four) times daily - after meals and at bedtime.    . multivitamin (RENA-VIT) TABS tablet Take 1 tablet by mouth daily.    . nitroGLYCERIN (NITROSTAT) 0.4 MG SL tablet Place 0.4 mg under the tongue every 5 (five) minutes as needed for chest pain.     . promethazine (PHENERGAN) 25 MG tablet Take 1 tablet (25 mg total) by mouth every 6 (six) hours as needed for nausea or vomiting. For nausea 30 tablet 0  . ranitidine (ZANTAC) 150 MG tablet Take 150 mg by mouth 2 (two) times daily.  4  . rOPINIRole (REQUIP) 2 MG tablet Take 2 mg by mouth daily with supper.     . sevelamer carbonate (RENVELA) 800 MG tablet Take 1,600 mg by mouth 3 (three) times daily with meals.    [name] Kitchen amLODipine (NORVASC) 10 MG tablet  Take 1 tablet (10 mg total) by mouth daily. (Patient not taking: Reported on 08/05/2015) 30 tablet 0  . furosemide (LASIX) 80 MG tablet Take 80 mg by mouth 2 (two) times daily. Reported on 08/05/2015  98  . metoprolol succinate (TOPROL-XL) 50 MG 24 hr tablet Take 1 tablet (50 mg total) by mouth daily. Take with or immediately following a meal. (Patient not taking: Reported on 08/05/2015) 30 tablet 0  . traMADol (ULTRAM) 50 MG tablet Take 1 tablet (50 mg total) by mouth every 6 (six) hours as needed. (Patient not taking: Reported on 06/10/2015) 20 tablet 0   No current facility-administered medications for this visit.   Facility-Administered Medications Ordered in Other Visits  Medication Dose Route Frequency Provider Last Rate Last Dose  . 0.9 %  sodium chloride infusion   Intravenous Continuous Angelia Mould, MD       . Chlorhexidine Gluconate Cloth 2 % PADS 6 each  6 each Topical Once Angelia Mould, MD        REVIEW OF SYSTEMS:  [X]  denotes positive finding, [ ]  denotes negative finding Cardiac  Comments:  Chest pain or chest pressure:    Shortness of breath upon exertion:    Short of breath when lying flat:    Irregular heart rhythm:    Constitutional    Fever or chills:      PHYSICAL EXAM: Filed Vitals:   08/05/15 1405  BP: 127/55  Pulse: 95  Temp: 97.8 F (36.6 C)  TempSrc: Oral  Height: 5\' 9"  (1.753 m)  Weight: 281 lb (127.461 kg)  SpO2: 99%    GENERAL: The patient is a well-nourished male, in no acute distress. The vital signs are documented above. CARDIOVASCULAR: There is a regular rate and rhythm. PULMONARY: There is good air exchange bilaterally without wheezing or rales. As an excellent thrill in the proximal fistula. Towards the shoulder it is more difficult to follow as it is deeper in this location. He has a palpable left radial pulse.  DUPLEX LEFT UPPER EXTREMITY: The diameters of his fistula range from 0.63-1.0 cm. Our reasonable in the proximal fistula although in the upper arm the depth ranges from 1.0-1.6 cm.  MEDICAL ISSUES:  STATUS POST LEFT BRACHIOCEPHALIC AV FISTULA: At this point, I think it would be reasonable to begin using his left upper arm fistula. I would start with one needle. If it is difficult to cannulate because of the depth then week and arrange for superficialization. However, he may have adequate access in the lower part of the fistula that this is not necessary. I will see him back as needed.  [name] [facility] [place]: [phone]

## 2015-08-08 ENCOUNTER — Encounter (HOSPITAL_COMMUNITY): Payer: Self-pay | Admitting: Emergency Medicine

## 2015-08-08 ENCOUNTER — Emergency Department (HOSPITAL_COMMUNITY)
Admission: EM | Admit: 2015-08-08 | Discharge: 2015-08-08 | Disposition: A | Discharge status: Home / Self Care | Payer: Medicare Other | Attending: Emergency Medicine | Admitting: Emergency Medicine

## 2015-08-08 DIAGNOSIS — M109 Gout, unspecified: Secondary | ICD-10-CM | POA: Insufficient documentation

## 2015-08-08 DIAGNOSIS — Z992 Dependence on renal dialysis: Secondary | ICD-10-CM | POA: Diagnosis not present

## 2015-08-08 DIAGNOSIS — R110 Nausea: Secondary | ICD-10-CM | POA: Diagnosis not present

## 2015-08-08 DIAGNOSIS — R197 Diarrhea, unspecified: Secondary | ICD-10-CM | POA: Insufficient documentation

## 2015-08-08 DIAGNOSIS — I2510 Atherosclerotic heart disease of native coronary artery without angina pectoris: Secondary | ICD-10-CM | POA: Insufficient documentation

## 2015-08-08 DIAGNOSIS — M1990 Unspecified osteoarthritis, unspecified site: Secondary | ICD-10-CM | POA: Insufficient documentation

## 2015-08-08 DIAGNOSIS — Z8719 Personal history of other diseases of the digestive system: Secondary | ICD-10-CM | POA: Insufficient documentation

## 2015-08-08 DIAGNOSIS — E663 Overweight: Secondary | ICD-10-CM | POA: Insufficient documentation

## 2015-08-08 DIAGNOSIS — Z7982 Long term (current) use of aspirin: Secondary | ICD-10-CM | POA: Diagnosis not present

## 2015-08-08 DIAGNOSIS — Z880 Allergy status to penicillin: Secondary | ICD-10-CM | POA: Insufficient documentation

## 2015-08-08 DIAGNOSIS — Z8701 Personal history of pneumonia (recurrent): Secondary | ICD-10-CM | POA: Diagnosis not present

## 2015-08-08 DIAGNOSIS — Z9889 Other specified postprocedural states: Secondary | ICD-10-CM | POA: Diagnosis not present

## 2015-08-08 DIAGNOSIS — E785 Hyperlipidemia, unspecified: Secondary | ICD-10-CM | POA: Diagnosis not present

## 2015-08-08 DIAGNOSIS — I120 Hypertensive chronic kidney disease with stage 5 chronic kidney disease or end stage renal disease: Secondary | ICD-10-CM | POA: Diagnosis not present

## 2015-08-08 DIAGNOSIS — Z9861 Coronary angioplasty status: Secondary | ICD-10-CM | POA: Diagnosis not present

## 2015-08-08 DIAGNOSIS — Z8669 Personal history of other diseases of the nervous system and sense organs: Secondary | ICD-10-CM | POA: Diagnosis not present

## 2015-08-08 DIAGNOSIS — Z79899 Other long term (current) drug therapy: Secondary | ICD-10-CM | POA: Diagnosis not present

## 2015-08-08 DIAGNOSIS — R109 Unspecified abdominal pain: Secondary | ICD-10-CM | POA: Insufficient documentation

## 2015-08-08 DIAGNOSIS — N186 End stage renal disease: Secondary | ICD-10-CM | POA: Insufficient documentation

## 2015-08-08 DIAGNOSIS — I509 Heart failure, unspecified: Secondary | ICD-10-CM | POA: Insufficient documentation

## 2015-08-08 LAB — COMPREHENSIVE METABOLIC PANEL
ALT: 18 U/L (ref 17–63)
AST: 22 U/L (ref 15–41)
Albumin: 3.4 g/dL — ABNORMAL LOW (ref 3.5–5.0)
Alkaline Phosphatase: 82 U/L (ref 38–126)
Anion gap: 15 (ref 5–15)
BUN: 29 mg/dL — ABNORMAL HIGH (ref 6–20)
CO2: 22 mmol/L (ref 22–32)
Calcium: 9.6 mg/dL (ref 8.9–10.3)
Chloride: 99 mmol/L — ABNORMAL LOW (ref 101–111)
Creatinine, Ser: 6.24 mg/dL — ABNORMAL HIGH (ref 0.61–1.24)
GFR calc Af Amer: 11 mL/min — ABNORMAL LOW (ref >60)
GFR calc non Af Amer: 9 mL/min — ABNORMAL LOW (ref >60)
Glucose, Bld: 162 mg/dL — ABNORMAL HIGH (ref 65–99)
Potassium: 4.1 mmol/L (ref 3.5–5.1)
Sodium: 136 mmol/L (ref 135–145)
Total Bilirubin: 0.2 mg/dL — ABNORMAL LOW (ref 0.3–1.2)
Total Protein: 7.9 g/dL (ref 6.5–8.1)

## 2015-08-08 LAB — CBC
HCT: 37.4 % — ABNORMAL LOW (ref 39.0–52.0)
Hemoglobin: 11.6 g/dL — ABNORMAL LOW (ref 13.0–17.0)
MCH: 31.1 pg (ref 26.0–34.0)
MCHC: 31 g/dL (ref 30.0–36.0)
MCV: 100.3 fL — ABNORMAL HIGH (ref 78.0–100.0)
Platelets: 252 10*3/uL (ref 150–400)
RBC: 3.73 MIL/uL — ABNORMAL LOW (ref 4.22–5.81)
RDW: 14.9 % (ref 11.5–15.5)
WBC: 8.6 10*3/uL (ref 4.0–10.5)

## 2015-08-08 LAB — LIPASE, BLOOD: Lipase: 36 U/L (ref 11–51)

## 2015-08-08 MED ORDER — SODIUM CHLORIDE 0.9 % IV BOLUS (SEPSIS)
250.0000 mL | Freq: Once | INTRAVENOUS | Status: AC
  Administered 2015-08-08: 250 mL via INTRAVENOUS

## 2015-08-08 MED ORDER — DICYCLOMINE HCL 10 MG PO CAPS
10.0000 mg | ORAL_CAPSULE | Freq: Once | ORAL | Status: AC
  Administered 2015-08-08: 10 mg via ORAL
  Filled 2015-08-08: qty 1

## 2015-08-08 MED ORDER — FENTANYL CITRATE (PF) 100 MCG/2ML IJ SOLN
50.0000 ug | Freq: Once | INTRAMUSCULAR | Status: AC
  Administered 2015-08-08: 50 ug via INTRAVENOUS
  Filled 2015-08-08: qty 2

## 2015-08-08 MED ORDER — PROCHLORPERAZINE MALEATE 10 MG PO TABS
10.0000 mg | ORAL_TABLET | Freq: Once | ORAL | Status: AC
  Administered 2015-08-08: 10 mg via ORAL
  Filled 2015-08-08: qty 1

## 2015-08-08 MED ORDER — DIPHENHYDRAMINE HCL 25 MG PO CAPS
25.0000 mg | ORAL_CAPSULE | Freq: Once | ORAL | Status: AC
  Administered 2015-08-08: 25 mg via ORAL
  Filled 2015-08-08: qty 1

## 2015-08-08 MED ORDER — DIPHENHYDRAMINE HCL 50 MG/ML IJ SOLN
25.0000 mg | Freq: Once | INTRAMUSCULAR | Status: DC
  Filled 2015-08-08: qty 1

## 2015-08-08 NOTE — ED Provider Notes (Signed)
CSN: [id number]     Arrival date & time 08/08/15  0054 History   First MD Initiated Contact with Patient 08/08/15 [phone]     Chief Complaint  Patient presents with  . Diarrhea     (Consider location/radiation/quality/duration/timing/severity/associated sxs/prior Treatment) HPI  This is a 50 year old male with a history of end-stage renal disease, hypertension, heart failure, coronary artery disease who presents with diarrhea. Onset of symptoms at 1 PM yesterday. No known sick contacts. No recent travel or antibiotic use. Reports nausea but without vomiting. Reports crampy abdominal pain. Last dialyzed on Friday morning. States "I just don't feel good." No fevers. Reports body aches.  Past Medical History  Diagnosis Date  . Hypertension   . Anemia of chronic disease   . Cholecystitis     a. 08/27/2011  . Constipation   . Pericardial effusion     a.  Small by CT 08/22/11;  b.  Large by CT 08/27/11  . Inguinal lymphadenopathy     a. bilateral - s/p biopsy 07/2011  . Fibromyalgia   . Chronic diastolic CHF (congestive heart failure) ([place])     a. Echo 3/13:  EF 55-60%;  b. Echo (11/15):  Mild LVH, EF 60-65%, no RWMA, Gr 1 DD, MAC, mild LAE, normal RVF, mild RAE;  c.  Echo 5/16:  severe LVH, EF 55-60%, no RWMA, Gr 1 DD, MAC, mild LAE  . HLD (hyperlipidemia)   . Hypothyroidism   . GERD (gastroesophageal reflux disease)   . CAD (coronary artery disease)     a. Lexiscan Myoview (11/15):  Inf, apical cap and apical lateral ischemia, EF 56%, inf HK, Moderate Risk;  b. LHC (11/15):  mid to dist Dx 80%, mid RCA 99% (functional CTO), dist RCA 80% >> PCI: balloon angioplasty to mid RCA (could not deliver stent) - plan staged PCI with HSRA of RCA; Dx to be tx medically >> PCI: Rotoblator atherectomy/DES to [facility]  . OSA (obstructive sleep apnea)     adjustable bed  . Shortness of breath dyspnea     too much Fluid  . Pneumonia ~ 2007  . Insulin dependent diabetes mellitus (HCC)     Type I  .  Claustrophobia     when things get around his face.   [name] Kitchen ESRD (end stage renal disease) ([place])     a. 1995 s/p cadaveric transplant w/ susbequent failure after 18 yrs;  b.Dialysis initiated 07/2011 [facility] M-W-F  . Constipation   . Arthritis     "hands, right knee" (03/19/2014)  . History of blood transfusion   . Difficult intubation     only once in 2015 at [facility] haven't had a problem since then  . Gout     PMH   Past Surgical History  Procedure Laterality Date  . Kidney transplant  04/25/1993  . Cataract extraction w/ intraocular lens  implant, bilateral Bilateral 1990's  . Arteriovenous graft placement Left 1993?    forearm  . Arteriovenous graft placement Right 1993?    leg  . Toe amputation Bilateral after 2005  . Below knee leg amputation Left 2010  . Carpal tunnel release Bilateral after 2005  . Shoulder arthroscopy Left ~ 2006    frozen  . Av fistula placement  07/29/2011    Procedure: ARTERIOVENOUS (AV) FISTULA CREATION;  Surgeon: Mal Misty, MD;  Location: [place];  Service: Vascular;  Laterality: Right;  Brachial cephalic  . Insertion of dialysis catheter  08/01/2011    Procedure: INSERTION  OF DIALYSIS CATHETER;  Surgeon: Rosetta Posner, MD;  Location: [place];  Service: Vascular;  Laterality: Right;  insertion of dialysis catheter on right internal jugular vein  . Lymph node biopsy  08/10/2011    Procedure: LYMPH NODE BIOPSY;  Surgeon: Merrie Roof, MD;  Location: [place];  Service: General;  Laterality: Left;  left groin lymph biopsy  . Colonoscopy  08/29/2011    Procedure: COLONOSCOPY;  Surgeon: Jerene Bears, MD;  Location: [place];  Service: Gastroenterology;  Laterality: N/A;  . Appendectomy  ~ 2006  . Below knee leg amputation Left   . Umbilical hernia repair  1990's  . Cardiac catheterization  03/19/2014  . Av graft placement Left 1993?    "attempted one in my wrist; didn't take"  . Neuroplasty / transposition ulnar nerve at elbow Left after 2005  .  Venogram Right 07/28/2011    Procedure: VENOGRAM;  Surgeon: Conrad Ruston, MD;  Location: [facility] C[facility] LAB;  Service: Cardiovascular;  Laterality: Right;  . Left heart catheterization with coronary angiogram N/A 03/19/2014    Procedure: LEFT HEART CATHETERIZATION WITH CORONARY ANGIOGRAM;  Surgeon: Burnell Blanks, MD;  Location: [facility] CATH LAB;  Service: Cardiovascular;  Laterality: N/A;  . Cardiac catheterization  03/19/2014    Procedure: CORONARY BALLOON ANGIOPLASTY;  Surgeon: Burnell Blanks, MD;  Location: [facility] CATH LAB;  Service: Cardiovascular;;  . Coronary angioplasty with stent placement  04/09/2014        ptca/des mid lad   . Angioplasty  04/09/2014    RCA   . Femoral artery repair  04/09/2014    ANGIOSEAL  . Percutaneous coronary rotoblator intervention (pci-r) N/A 04/09/2014    Procedure: PERCUTANEOUS CORONARY ROTOBLATOR INTERVENTION (PCI-R);  Surgeon: Burnell Blanks, MD;  Location: [facility] CATH LAB;  Service: Cardiovascular;  Laterality: N/A;  . Revison of arteriovenous fistula Right [id number]    Procedure: PLICATION / REVISION OF ARTERIOVENOUS FISTULA;  Surgeon: Angelia Mould, MD;  Location: [place];  Service: Vascular;  Laterality: Right;  . Peripheral vascular catheterization N/A 12/25/2014    Procedure: Upper Extremity Venography;  Surgeon: Conrad Altoona, MD;  Location: [place] CV LAB;  Service: Cardiovascular;  Laterality: N/A;  . Carpal tunnel release Bilateral   . Eye surgery Bilateral     cataract  . Amputation of replicated toes Right   . Vitrectomy Bilateral   . Peritoneal catheter insertion    . Peritoneal catheter removal    . Arteriovenous graft placement Left     Thigh  . Arteriovenous graft removed Left     Thigh  . Av fistula placement Left 01/20/2015    Procedure: LIGATION OF RIGHT ARTERIOVENOUS FISTULA WITH EXCISION OF ANEURYSM;  Surgeon: Angelia Mould, MD;  Location: [place];  Service: Vascular;  Laterality: Left;  . Insertion of dialysis  catheter Right 01/20/2015    Procedure: INSERTION OF DIALYSIS CATHETER - RIGHT INTERNAL JUGULAR ;  Surgeon: Angelia Mould, MD;  Location: [place];  Service: Vascular;  Laterality: Right;  . Av fistula placement Left 04/30/2015    Procedure: CREATION OF LEFT ARM BRACHIO-CEPHALIC ARTERIOVENOUS (AV) FISTULA ;  Surgeon: Angelia Mould, MD;  Location: [facility] OR;  Service: Vascular;  Laterality: Left;   Family History  Problem Relation Age of Onset  . Malignant hyperthermia Neg Hx   . Heart attack Neg Hx   . Stroke Neg Hx   . Colon polyps Father   . Diabetes Father   .  Hypertension Father   . Breast cancer Maternal Aunt   . Cancer Maternal Aunt     Breast and Bone  . Hypertension Mother   . Diabetes Mother   . Hyperlipidemia Mother    Social History  Substance Use Topics  . Smoking status: Never Smoker   . Smokeless tobacco: Never Used  . Alcohol Use: Yes    Review of Systems  Constitutional: Negative for fever.  Respiratory: Negative for chest tightness.   Cardiovascular: Negative for chest pain.  Gastrointestinal: Positive for nausea, abdominal pain and diarrhea. Negative for vomiting.  All other systems reviewed and are negative.     Allergies  Contrast media; Amoxicillin; Morphine and related; Adhesive; Mobic; Ciprofloxacin; Clindamycin/lincomycin; Codeine; Doxycycline; Hydrocodone; Levofloxacin; Oxycodone; Plavix; Rocephin; and Vasotec  Home Medications   Prior to Admission medications   Medication Sig Start Date End Date Taking? Authorizing Provider  allopurinol (ZYLOPRIM) 100 MG tablet Take 200 mg by mouth daily.  03/14/12  Yes Historical Provider, MD  aspirin EC 81 MG tablet Take 81 mg by mouth at bedtime.   Yes Historical Provider, MD  atorvastatin (LIPITOR) 40 MG tablet Take 40 mg by mouth at bedtime. 02/13/15  Yes Historical Provider, MD  calcium acetate (PHOSLO) 667 MG capsule Take 1,334-2,001 mg by mouth See admin instructions. Take 3 capsules (2001 mg) by  mouth with meals and 2 capsule (1334mg  ) with snacks 09/19/12  Yes Historical Provider, MD  diphenhydrAMINE (BENADRYL) 25 mg capsule Take 1-2 capsules (25-50 mg total) by mouth every 6 (six) hours as needed for itching. Patient taking differently: Take 25-50 mg by mouth every 6 (six) hours as needed for itching or sleep.  04/11/14  Yes Rhonda G Barrett, PA-C  docusate sodium (COLACE) 100 MG capsule Take 200 mg by mouth 3 (three) times daily as needed for mild constipation.    Yes Historical Provider, MD  fenofibrate (TRICOR) 48 MG tablet Take 1 tablet (48 mg total) by mouth daily. 02/27/15  Yes Shanker Kristeen Mans, MD  fluticasone (FLONASE) 50 MCG/ACT nasal spray Place 2 sprays into both nostrils daily as needed for allergies.    Yes Historical Provider, MD  furosemide (LASIX) 80 MG tablet Take 80 mg by mouth 2 (two) times daily. Reported on 08/05/2015 11/25/14  Yes Historical Provider, MD  gabapentin (NEURONTIN) 100 MG capsule Take 100-200 mg by mouth 2 (two) times daily. Take 100mg  during the day and 200mg  at bedtime.   Yes Historical Provider, MD  GLUCAGON EMERGENCY 1 MG injection Inject 1 mg into the muscle daily as needed (low blood sugar).  01/19/12  Yes Historical Provider, MD  hydrocortisone 2.5 % cream Apply 1 application topically 4 (four) times daily as needed (itching).   Yes Historical Provider, MD  HYDROmorphone (DILAUDID) 2 MG tablet Take 1 tablet (2 mg total) by mouth 3 (three) times daily as needed for severe pain. Patient taking differently: Take 2 mg by mouth 2 (two) times daily. Take 1 tablet (2 mg) by mouth daily at bedtime, may take an additional dose during the day as needed for severe pain 11/20/14  Yes Alvia Grove, PA-C  Insulin Human (INSULIN PUMP) 100 unit/ml SOLN Inject into the skin continuous. Humalog   Yes Historical Provider, MD  levothyroxine (SYNTHROID, LEVOTHROID) 100 MCG tablet Take 100 mcg by mouth at bedtime.    Yes Historical Provider, MD  loratadine (CLARITIN) 10 MG  tablet Take 10 mg by mouth daily.   Yes Historical Provider, MD  metoCLOPramide (REGLAN) 5  MG tablet Take 5 mg by mouth 4 (four) times daily - after meals and at bedtime. 05/26/11  Yes Historical Provider, MD  midodrine (PROAMATINE) 10 MG tablet Take 10 mg by mouth 3 (three) times a week. On dialysis days   Yes Historical Provider, MD  multivitamin (RENA-VIT) TABS tablet Take 1 tablet by mouth daily.   Yes Historical Provider, MD  nitroGLYCERIN (NITROSTAT) 0.4 MG SL tablet Place 0.4 mg under the tongue every 5 (five) minutes as needed for chest pain.    Yes [name], MD  promethazine (PHENERGAN) 25 MG tablet Take 1 tablet (25 mg total) by mouth every 6 (six) hours as needed for nausea or vomiting. For nausea 11/27/14  Yes Liliane Shi, PA-C  ranitidine (ZANTAC) 150 MG tablet Take 150 mg by mouth 2 (two) times daily. 11/01/14  Yes Historical Provider, MD  rOPINIRole (REQUIP) 2 MG tablet Take 2 mg by mouth daily with supper.    Yes Historical Provider, MD  sevelamer carbonate (RENVELA) 800 MG tablet Take 2,400 mg by mouth 3 (three) times daily with meals.    Yes Historical Provider, MD   BP 105/52 mmHg  Pulse 94  Temp(Src) 97.8 F (36.6 C) (Oral)  Resp 18  SpO2 98% Physical Exam  Constitutional: He is oriented to person, place, and time. He appears well-developed and well-nourished.  Overweight  HENT:  Head: Normocephalic and atraumatic.  Eyes: Pupils are equal, round, and reactive to light.  Cardiovascular: Normal rate, regular rhythm and normal heart sounds.   No murmur heard. Pulmonary/Chest: Effort normal and breath sounds normal. No respiratory distress. He has no wheezes.  Abdominal: Soft. Bowel sounds are normal. There is no tenderness. There is no rebound and no guarding.  Musculoskeletal: He exhibits no edema.  Left AKA Fistula left upper extremity with positive thrill  Neurological: He is alert and oriented to person, place, and time.  Skin: Skin is warm and dry.   Psychiatric: He has a normal mood and affect.  Nursing note and vitals reviewed.   ED Course  Procedures (including critical care time) Labs Review Labs Reviewed  COMPREHENSIVE METABOLIC PANEL - Abnormal; Notable for the following:    Chloride 99 (*)    Glucose, Bld 162 (*)    BUN 29 (*)    Creatinine, Ser 6.24 (*)    Albumin 3.4 (*)    Total Bilirubin 0.2 (*)    GFR calc non Af Amer 9 (*)    GFR calc Af Amer 11 (*)    All other components within normal limits  CBC - Abnormal; Notable for the following:    RBC 3.73 (*)    Hemoglobin 11.6 (*)    HCT 37.4 (*)    MCV 100.3 (*)    All other components within normal limits  LIPASE, BLOOD  URINALYSIS, ROUTINE W REFLEX MICROSCOPIC (NOT AT [facility])    Imaging Review No results found. I have personally reviewed and evaluated these images and lab results as part of my medical decision-making.   EKG Interpretation None      MDM   Final diagnoses:  Diarrhea, unspecified type  Nausea    Patient presents with nausea and diarrhea. Onset yesterday. Last dialyzed yesterday. Vital signs are reassuring. Patient has a nontender abdomen. Lab work is at the patient's baseline. Patient was given a small fluid bolus of 250 mL given that he is on dialysis. He was also given nausea medication. No indication for imaging at this time. Likely  viral in nature. No risk factors for infectious etiology.    7:43 AM On recheck, patient reports improvement. No persistent diarrhea while here. He has been able to tolerate fluids without difficulty.  After history, exam, and medical workup I feel the patient has been appropriately medically screened and is safe for discharge home. Pertinent diagnoses were discussed with the patient. Patient was given return precautions.     Merryl Hacker, MD 08/08/15 [phone]

## 2015-08-08 NOTE — ED Notes (Signed)
Pt. reports diarrhea with nausea onset yesterday , denies emesis or fever , hemodialysis q Mon / Wed/ Fri .

## 2015-08-08 NOTE — Discharge Instructions (Signed)
Diarrhea Diarrhea is frequent loose and watery bowel movements. It can cause you to feel weak and dehydrated. Dehydration can cause you to become tired and thirsty, have a dry mouth, and have decreased urination that often is dark yellow. Diarrhea is a sign of another problem, most often an infection that will not last long. In most cases, diarrhea typically lasts 2-3 days. However, it can last longer if it is a sign of something more serious. It is important to treat your diarrhea as directed by your caregiver to lessen or prevent future episodes of diarrhea. CAUSES  Some common causes include:  Gastrointestinal infections caused by viruses, bacteria, or parasites.  Food poisoning or food allergies.  Certain medicines, such as antibiotics, chemotherapy, and laxatives.  Artificial sweeteners and fructose.  Digestive disorders. HOME CARE INSTRUCTIONS  Ensure adequate fluid intake (hydration): Have 1 cup (8 oz) of fluid for each diarrhea episode. Avoid fluids that contain simple sugars or sports drinks, fruit juices, whole milk products, and sodas. Your urine should be clear or pale yellow if you are drinking enough fluids. Hydrate with an oral rehydration solution that you can purchase at pharmacies, retail stores, and online. You can prepare an oral rehydration solution at home by mixing the following ingredients together:   - tsp table salt.   tsp baking soda.   tsp salt substitute containing potassium chloride.  1  tablespoons sugar.  1 L (34 oz) of water.  Certain foods and beverages may increase the speed at which food moves through the gastrointestinal (GI) tract. These foods and beverages should be avoided and include:  Caffeinated and alcoholic beverages.  High-fiber foods, such as raw fruits and vegetables, nuts, seeds, and whole grain breads and cereals.  Foods and beverages sweetened with sugar alcohols, such as xylitol, sorbitol, and mannitol.  Some foods may be well  tolerated and may help thicken stool including:  Starchy foods, such as rice, toast, pasta, low-sugar cereal, oatmeal, grits, baked potatoes, crackers, and bagels.  Bananas.  Applesauce.  Add probiotic-rich foods to help increase healthy bacteria in the GI tract, such as yogurt and fermented milk products.  Wash your hands well after each diarrhea episode.  Only take over-the-counter or prescription medicines as directed by your caregiver.  Take a warm bath to relieve any burning or pain from frequent diarrhea episodes. SEEK IMMEDIATE MEDICAL CARE IF:   You are unable to keep fluids down.  You have persistent vomiting.  You have blood in your stool, or your stools are black and tarry.  You do not urinate in 6-8 hours, or there is only a small amount of very dark urine.  You have abdominal pain that increases or localizes.  You have weakness, dizziness, confusion, or light-headedness.  You have a severe headache.  Your diarrhea gets worse or does not get better.  You have a fever or persistent symptoms for more than 2-3 days.  You have a fever and your symptoms suddenly get worse. MAKE SURE YOU:   Understand these instructions.  Will watch your condition.  Will get help right away if you are not doing well or get worse.   This information is not intended to replace advice given to you by your health care provider. Make sure you discuss any questions you have with your health care provider.   Document Released: 04/01/2002 Document Revised: 05/02/2014 Document Reviewed: 12/18/2011 Elsevier Interactive Patient Education 2016 [employer] Inc[employer]  

## 2015-08-18 DIAGNOSIS — E113551 Type 2 diabetes mellitus with stable proliferative diabetic retinopathy, right eye: Secondary | ICD-10-CM | POA: Diagnosis not present

## 2015-08-18 DIAGNOSIS — Z794 Long term (current) use of insulin: Secondary | ICD-10-CM | POA: Diagnosis not present

## 2015-08-18 DIAGNOSIS — E113532 Type 2 diabetes mellitus with proliferative diabetic retinopathy with traction retinal detachment not involving the macula, left eye: Secondary | ICD-10-CM | POA: Diagnosis not present

## 2015-08-20 DIAGNOSIS — E039 Hypothyroidism, unspecified: Secondary | ICD-10-CM | POA: Diagnosis not present

## 2015-08-20 DIAGNOSIS — E1065 Type 1 diabetes mellitus with hyperglycemia: Secondary | ICD-10-CM | POA: Diagnosis not present

## 2015-08-20 DIAGNOSIS — E1029 Type 1 diabetes mellitus with other diabetic kidney complication: Secondary | ICD-10-CM | POA: Diagnosis not present

## 2015-08-20 DIAGNOSIS — E785 Hyperlipidemia, unspecified: Secondary | ICD-10-CM | POA: Diagnosis not present

## 2015-08-20 DIAGNOSIS — E2740 Unspecified adrenocortical insufficiency: Secondary | ICD-10-CM | POA: Diagnosis not present

## 2015-08-23 DIAGNOSIS — T8612 Kidney transplant failure: Secondary | ICD-10-CM | POA: Diagnosis not present

## 2015-08-23 DIAGNOSIS — Z992 Dependence on renal dialysis: Secondary | ICD-10-CM | POA: Diagnosis not present

## 2015-08-23 DIAGNOSIS — N186 End stage renal disease: Secondary | ICD-10-CM | POA: Diagnosis not present

## 2015-08-24 DIAGNOSIS — D631 Anemia in chronic kidney disease: Secondary | ICD-10-CM | POA: Diagnosis not present

## 2015-08-24 DIAGNOSIS — E109 Type 1 diabetes mellitus without complications: Secondary | ICD-10-CM | POA: Diagnosis not present

## 2015-08-24 DIAGNOSIS — N186 End stage renal disease: Secondary | ICD-10-CM | POA: Diagnosis not present

## 2015-08-24 DIAGNOSIS — N2581 Secondary hyperparathyroidism of renal origin: Secondary | ICD-10-CM | POA: Diagnosis not present

## 2015-08-24 DIAGNOSIS — D689 Coagulation defect, unspecified: Secondary | ICD-10-CM | POA: Diagnosis not present

## 2015-08-24 DIAGNOSIS — D509 Iron deficiency anemia, unspecified: Secondary | ICD-10-CM | POA: Diagnosis not present

## 2015-08-25 ENCOUNTER — Encounter: Payer: Self-pay | Admitting: Vascular Surgery

## 2015-08-26 ENCOUNTER — Encounter: Payer: Self-pay | Admitting: Vascular Surgery

## 2015-08-26 ENCOUNTER — Encounter: Payer: Self-pay | Admitting: *Deleted

## 2015-08-26 ENCOUNTER — Other Ambulatory Visit: Payer: Self-pay | Admitting: *Deleted

## 2015-08-26 ENCOUNTER — Ambulatory Visit (INDEPENDENT_AMBULATORY_CARE_PROVIDER_SITE_OTHER): Payer: Medicare Other | Admitting: Vascular Surgery

## 2015-08-26 VITALS — BP 104/59 | HR 86 | Ht 69.0 in | Wt 281.0 lb

## 2015-08-26 DIAGNOSIS — N185 Chronic kidney disease, stage 5: Secondary | ICD-10-CM

## 2015-08-26 NOTE — Progress Notes (Signed)
Patient name: Dale [name] MRN: [id number] DOB: 21-Sep-1965 Sex: male  REASON FOR VISIT: Unable to use AV fistula.  HPI: Dale [name] is a 50 y.o. male who had a left brachiocephalic fistula placed on 04/30/2015. He was last seen in the office on 08/05/2015. At that time, the diameters of the fistula ranged from 0.63-1.0 cm. The vein was somewhat deep in the upper arm. My feeling was that if the vein was difficult to cannulate he may require superficialization. He comes in today and tells me that they have not been able to use his fistula. He denies pain or paresthesias in the left arm.  Current Outpatient Prescriptions  Medication Sig Dispense Refill  . allopurinol (ZYLOPRIM) 100 MG tablet Take 200 mg by mouth daily.     [name] Kitchen aspirin EC 81 MG tablet Take 81 mg by mouth at bedtime.    [name] Kitchen atorvastatin (LIPITOR) 40 MG tablet Take 40 mg by mouth at bedtime.  0  . calcium acetate (PHOSLO) 667 MG capsule Take 1,334-2,001 mg by mouth See admin instructions. Take 3 capsules (2001 mg) by mouth with meals and 2 capsule (1334mg  ) with snacks    . diphenhydrAMINE (BENADRYL) 25 mg capsule Take 1-2 capsules (25-50 mg total) by mouth every 6 (six) hours as needed for itching. (Patient taking differently: Take 25-50 mg by mouth every 6 (six) hours as needed for itching or sleep. ) 30 capsule 0  . docusate sodium (COLACE) 100 MG capsule Take 200 mg by mouth 3 (three) times daily as needed for mild constipation.     . fenofibrate (TRICOR) 48 MG tablet Take 1 tablet (48 mg total) by mouth daily. 30 tablet 0  . fluticasone (FLONASE) 50 MCG/ACT nasal spray Place 2 sprays into both nostrils daily as needed for allergies.     [name] Kitchen gabapentin (NEURONTIN) 100 MG capsule Take 100-200 mg by mouth 2 (two) times daily. Take 100mg  during the day and 200mg  at bedtime.    [name] Kitchen GLUCAGON EMERGENCY 1 MG injection Inject 1 mg into the muscle daily as needed (low blood sugar).     . hydrocortisone 2.5 % cream Apply 1 application  topically 4 (four) times daily as needed (itching).    [name] Kitchen HYDROmorphone (DILAUDID) 2 MG tablet Take 1 tablet (2 mg total) by mouth 3 (three) times daily as needed for severe pain. (Patient taking differently: Take 2 mg by mouth 2 (two) times daily. Take 1 tablet (2 mg) by mouth daily at bedtime, may take an additional dose during the day as needed for severe pain) 20 tablet 0  . Insulin Human (INSULIN PUMP) 100 unit/ml SOLN Inject into the skin continuous. Humalog    . levothyroxine (SYNTHROID, LEVOTHROID) 100 MCG tablet Take 100 mcg by mouth at bedtime.     [name] Kitchen loratadine (CLARITIN) 10 MG tablet Take 10 mg by mouth daily.    . metoCLOPramide (REGLAN) 5 MG tablet Take 5 mg by mouth 4 (four) times daily - after meals and at bedtime.    . midodrine (PROAMATINE) 10 MG tablet Take 10 mg by mouth 3 (three) times a week. On dialysis days    . multivitamin (RENA-VIT) TABS tablet Take 1 tablet by mouth daily.    . nitroGLYCERIN (NITROSTAT) 0.4 MG SL tablet Place 0.4 mg under the tongue every 5 (five) minutes as needed for chest pain.     . promethazine (PHENERGAN) 25 MG tablet Take 1 tablet (25 mg total) by mouth every 6 (six)  hours as needed for nausea or vomiting. For nausea 30 tablet 0  . ranitidine (ZANTAC) 150 MG tablet Take 150 mg by mouth 2 (two) times daily.  4  . rOPINIRole (REQUIP) 2 MG tablet Take 2 mg by mouth daily with supper.     . sevelamer carbonate (RENVELA) 800 MG tablet Take 2,400 mg by mouth 3 (three) times daily with meals.     . furosemide (LASIX) 80 MG tablet Take 80 mg by mouth 2 (two) times daily. Reported on 08/26/2015  98   No current facility-administered medications for this visit.   Facility-Administered Medications Ordered in Other Visits  Medication Dose Route Frequency Provider Last Rate Last Dose  . 0.9 %  sodium chloride infusion   Intravenous Continuous Angelia Mould, MD      . Chlorhexidine Gluconate Cloth 2 % PADS 6 each  6 each Topical Once Angelia Mould, MD        REVIEW OF SYSTEMS:  [X]  denotes positive finding, [ ]  denotes negative finding Cardiac  Comments:  Chest pain or chest pressure:    Shortness of breath upon exertion:    Short of breath when lying flat:    Irregular heart rhythm:    Constitutional    Fever or chills:      PHYSICAL EXAM: Filed Vitals:   08/26/15 1536  BP: 104/59  Pulse: 86  Height: 5\' 9"  (1.753 m)  Weight: 281 lb (127.461 kg)  SpO2: 96%    GENERAL: The patient is a well-nourished male, in no acute distress. The vital signs are documented above. CARDIOVASCULAR: There is a regular rate and rhythm. PULMONARY: There is good air exchange bilaterally without wheezing or rales. The fistula has a good thrill and bruit. It does appear fairly deep in the upper arm.  MEDICAL ISSUES:  STATUS POST LEFT BRACHIOCEPHALIC AV FISTULA: Given that the dialysis center is having problems cannulating the fistula, I have recommended that we superficialize the fistula. There are also a couple competing branches which we will ligate at the same time. There are some elevated velocities at the proximal anastomosis and I'll explore this also encasing needs a patch there. This has been scheduled for 09/01/2015.  [name] [facility] [place]: [phone]

## 2015-08-28 ENCOUNTER — Encounter (HOSPITAL_COMMUNITY): Payer: Self-pay | Admitting: *Deleted

## 2015-08-28 NOTE — Progress Notes (Addendum)
NOTIFIED DIABETES COORDINATOR OF PATIENT HAVING INSULIN PUMP AND IS [facility] 09/01/15    FROM 1145- 1310.    PATIENT WAS INSTRUCTED TO PLACE PUMP AT BASAL RATE.

## 2015-08-31 MED ORDER — VANCOMYCIN HCL 10 G IV SOLR
1500.0000 mg | INTRAVENOUS | Status: DC
  Filled 2015-08-31: qty 1500

## 2015-09-01 ENCOUNTER — Other Ambulatory Visit: Payer: Self-pay

## 2015-09-01 ENCOUNTER — Encounter (HOSPITAL_COMMUNITY): Payer: Self-pay | Admitting: *Deleted

## 2015-09-01 ENCOUNTER — Ambulatory Visit (HOSPITAL_COMMUNITY): Payer: Medicare Other | Admitting: Anesthesiology

## 2015-09-01 ENCOUNTER — Ambulatory Visit (HOSPITAL_COMMUNITY)
Admission: RE | Admit: 2015-09-01 | Discharge: 2015-09-01 | Disposition: A | Discharge status: Home / Self Care | Payer: Medicare Other | Source: Ambulatory Visit | Attending: Vascular Surgery | Admitting: Vascular Surgery

## 2015-09-01 ENCOUNTER — Encounter (HOSPITAL_COMMUNITY)
Admission: RE | Disposition: A | Discharge status: Home / Self Care | Payer: Self-pay | Source: Ambulatory Visit | Attending: Vascular Surgery

## 2015-09-01 DIAGNOSIS — Y832 Surgical operation with anastomosis, bypass or graft as the cause of abnormal reaction of the patient, or of later complication, without mention of misadventure at the time of the procedure: Secondary | ICD-10-CM | POA: Insufficient documentation

## 2015-09-01 DIAGNOSIS — Z79899 Other long term (current) drug therapy: Secondary | ICD-10-CM | POA: Diagnosis not present

## 2015-09-01 DIAGNOSIS — I2510 Atherosclerotic heart disease of native coronary artery without angina pectoris: Secondary | ICD-10-CM | POA: Diagnosis not present

## 2015-09-01 DIAGNOSIS — Z794 Long term (current) use of insulin: Secondary | ICD-10-CM | POA: Insufficient documentation

## 2015-09-01 DIAGNOSIS — G4730 Sleep apnea, unspecified: Secondary | ICD-10-CM | POA: Diagnosis not present

## 2015-09-01 DIAGNOSIS — E1122 Type 2 diabetes mellitus with diabetic chronic kidney disease: Secondary | ICD-10-CM | POA: Diagnosis not present

## 2015-09-01 DIAGNOSIS — K219 Gastro-esophageal reflux disease without esophagitis: Secondary | ICD-10-CM | POA: Diagnosis not present

## 2015-09-01 DIAGNOSIS — N186 End stage renal disease: Secondary | ICD-10-CM

## 2015-09-01 DIAGNOSIS — I120 Hypertensive chronic kidney disease with stage 5 chronic kidney disease or end stage renal disease: Secondary | ICD-10-CM | POA: Insufficient documentation

## 2015-09-01 DIAGNOSIS — Z48812 Encounter for surgical aftercare following surgery on the circulatory system: Secondary | ICD-10-CM

## 2015-09-01 DIAGNOSIS — T82898A Other specified complication of vascular prosthetic devices, implants and grafts, initial encounter: Secondary | ICD-10-CM | POA: Diagnosis not present

## 2015-09-01 DIAGNOSIS — T82590A Other mechanical complication of surgically created arteriovenous fistula, initial encounter: Secondary | ICD-10-CM | POA: Insufficient documentation

## 2015-09-01 DIAGNOSIS — Z7982 Long term (current) use of aspirin: Secondary | ICD-10-CM | POA: Diagnosis not present

## 2015-09-01 DIAGNOSIS — M797 Fibromyalgia: Secondary | ICD-10-CM | POA: Diagnosis not present

## 2015-09-01 HISTORY — PX: REVISON OF ARTERIOVENOUS FISTULA: SHX6074

## 2015-09-01 LAB — POCT I-STAT 4, (NA,K, GLUC, HGB,HCT)
Glucose, Bld: 111 mg/dL — ABNORMAL HIGH (ref 65–99)
HCT: 35 % — ABNORMAL LOW (ref 39.0–52.0)
Hemoglobin: 11.9 g/dL — ABNORMAL LOW (ref 13.0–17.0)
Potassium: 5.7 mmol/L — ABNORMAL HIGH (ref 3.5–5.1)
Sodium: 134 mmol/L — ABNORMAL LOW (ref 135–145)

## 2015-09-01 LAB — GLUCOSE, CAPILLARY
Glucose-Capillary: 99 mg/dL (ref 65–99)
Glucose-Capillary: 118 mg/dL — ABNORMAL HIGH (ref 65–99)
Glucose-Capillary: 103 mg/dL — ABNORMAL HIGH (ref 65–99)

## 2015-09-01 SURGERY — REVISON OF ARTERIOVENOUS FISTULA
Anesthesia: Monitor Anesthesia Care | Site: Arm Upper | Laterality: Left

## 2015-09-01 MED ORDER — HYDROMORPHONE HCL 1 MG/ML IJ SOLN
INTRAMUSCULAR | Status: AC
  Administered 2015-09-01: 0.5 mg via INTRAVENOUS
  Filled 2015-09-01: qty 1

## 2015-09-01 MED ORDER — HYDROMORPHONE HCL 1 MG/ML IJ SOLN
0.2500 mg | INTRAMUSCULAR | Status: DC | PRN
  Administered 2015-09-01 (×4): 0.5 mg via INTRAVENOUS

## 2015-09-01 MED ORDER — FENTANYL CITRATE (PF) 250 MCG/5ML IJ SOLN
INTRAMUSCULAR | Status: AC
  Filled 2015-09-01: qty 5

## 2015-09-01 MED ORDER — PROPOFOL 500 MG/50ML IV EMUL
INTRAVENOUS | Status: DC | PRN
  Administered 2015-09-01: 25 ug/kg/min via INTRAVENOUS

## 2015-09-01 MED ORDER — ONDANSETRON HCL 4 MG/2ML IJ SOLN: 4.0000 mg | Freq: Once | INTRAMUSCULAR | Status: DC | PRN

## 2015-09-01 MED ORDER — SODIUM CHLORIDE 0.9 % IR SOLN
Status: DC | PRN
  Administered 2015-09-01: 1000 mL

## 2015-09-01 MED ORDER — CHLORHEXIDINE GLUCONATE CLOTH 2 % EX PADS: 6.0000 | MEDICATED_PAD | Freq: Once | CUTANEOUS | Status: DC

## 2015-09-01 MED ORDER — LIDOCAINE HCL (PF) 1 % IJ SOLN
INTRAMUSCULAR | Status: DC | PRN
  Administered 2015-09-01: 4 mL via INTRADERMAL
  Administered 2015-09-01 (×2): 30 mL via INTRADERMAL

## 2015-09-01 MED ORDER — MIDAZOLAM HCL 2 MG/2ML IJ SOLN
INTRAMUSCULAR | Status: AC
  Filled 2015-09-01: qty 2

## 2015-09-01 MED ORDER — MIDAZOLAM HCL 5 MG/5ML IJ SOLN
INTRAMUSCULAR | Status: DC | PRN
  Administered 2015-09-01: 2 mg via INTRAVENOUS
  Administered 2015-09-01 (×2): 1 mg via INTRAVENOUS

## 2015-09-01 MED ORDER — SODIUM CHLORIDE 0.9 % IV SOLN
INTRAVENOUS | Status: DC | PRN
  Administered 2015-09-01: 500 mL

## 2015-09-01 MED ORDER — SODIUM CHLORIDE 0.9 % IV SOLN
INTRAVENOUS | Status: DC | PRN
  Administered 2015-09-01 (×2): via INTRAVENOUS

## 2015-09-01 MED ORDER — HYDROMORPHONE HCL 2 MG PO TABS: 2.0000 mg | ORAL_TABLET | ORAL | Status: DC | PRN

## 2015-09-01 MED ORDER — LIDOCAINE HCL (PF) 1 % IJ SOLN
INTRAMUSCULAR | Status: AC
  Filled 2015-09-01: qty 30

## 2015-09-01 MED ORDER — OXYCODONE-ACETAMINOPHEN 5-325 MG PO TABS: 1.0000 | ORAL_TABLET | ORAL | Status: DC | PRN

## 2015-09-01 MED ORDER — VANCOMYCIN HCL IN DEXTROSE 1-5 GM/200ML-% IV SOLN
INTRAVENOUS | Status: AC
  Administered 2015-09-01: 1000 mg via INTRAVENOUS
  Filled 2015-09-01: qty 200

## 2015-09-01 MED ORDER — PROPOFOL 10 MG/ML IV BOLUS
INTRAVENOUS | Status: AC
  Filled 2015-09-01: qty 20

## 2015-09-01 MED ORDER — SODIUM CHLORIDE 0.9 % IV SOLN
INTRAVENOUS | Status: DC
  Administered 2015-09-01: 10:00:00 via INTRAVENOUS

## 2015-09-01 MED ORDER — MEPERIDINE HCL 25 MG/ML IJ SOLN: 6.2500 mg | INTRAMUSCULAR | Status: DC | PRN

## 2015-09-01 MED ORDER — FENTANYL CITRATE (PF) 100 MCG/2ML IJ SOLN
INTRAMUSCULAR | Status: DC | PRN
Start: 2015-09-01 — End: 2015-09-01
  Administered 2015-09-01 (×5): 25 ug via INTRAVENOUS
  Administered 2015-09-01 (×2): 50 ug via INTRAVENOUS
  Administered 2015-09-01: 25 ug via INTRAVENOUS

## 2015-09-01 MED ORDER — VANCOMYCIN HCL IN DEXTROSE 1-5 GM/200ML-% IV SOLN: 1000.0000 mg | INTRAVENOUS | Status: DC

## 2015-09-01 MED ORDER — MIDAZOLAM HCL 2 MG/2ML IJ SOLN
INTRAMUSCULAR | Status: AC
Start: 2015-09-01 — End: 2015-09-02
  Filled 2015-09-01: qty 2

## 2015-09-01 MED ORDER — ROCURONIUM BROMIDE 50 MG/5ML IV SOLN
INTRAVENOUS | Status: AC
  Filled 2015-09-01: qty 1

## 2015-09-01 SURGICAL SUPPLY — 32 items
CANISTER SUCTION 2500CC (MISCELLANEOUS) ×2 IMPLANT
CANNULA VESSEL 3MM 2 BLNT TIP (CANNULA) ×1 IMPLANT
CLIP TI MEDIUM 6 (CLIP) ×2 IMPLANT
CLIP TI WIDE RED SMALL 6 (CLIP) ×2 IMPLANT
COVER PROBE W GEL 5X96 (DRAPES) ×2 IMPLANT
ELECT REM PT RETURN 9FT ADLT (ELECTROSURGICAL) ×2
ELECTRODE REM PT RTRN 9FT ADLT (ELECTROSURGICAL) ×1 IMPLANT
GLOVE BIO SURGEON STRL SZ 6.5 (GLOVE) ×3 IMPLANT
GLOVE BIO SURGEON STRL SZ7 (GLOVE) ×2 IMPLANT
GLOVE BIO SURGEON STRL SZ7.5 (GLOVE) ×3 IMPLANT
GLOVE BIOGEL PI IND STRL 6.5 (GLOVE) IMPLANT
GLOVE BIOGEL PI IND STRL 7.5 (GLOVE) IMPLANT
GLOVE BIOGEL PI IND STRL 8 (GLOVE) ×1 IMPLANT
GLOVE BIOGEL PI INDICATOR 6.5 (GLOVE) ×2
GLOVE BIOGEL PI INDICATOR 7.5 (GLOVE) ×2
GLOVE BIOGEL PI INDICATOR 8 (GLOVE) ×2
GOWN STRL REUS W/ TWL LRG LVL3 (GOWN DISPOSABLE) ×3 IMPLANT
GOWN STRL REUS W/TWL LRG LVL3 (GOWN DISPOSABLE) ×6
GOWN STRL REUS W/TWL XL LVL3 (GOWN DISPOSABLE) ×1 IMPLANT
KIT BASIN OR (CUSTOM PROCEDURE TRAY) ×2 IMPLANT
KIT ROOM TURNOVER OR (KITS) ×2 IMPLANT
LIQUID BAND (GAUZE/BANDAGES/DRESSINGS) ×2 IMPLANT
NS IRRIG 1000ML POUR BTL (IV SOLUTION) ×2 IMPLANT
PACK CV ACCESS (CUSTOM PROCEDURE TRAY) ×2 IMPLANT
PAD ARMBOARD 7.5X6 YLW CONV (MISCELLANEOUS) ×4 IMPLANT
SPONGE SURGIFOAM ABS GEL 100 (HEMOSTASIS) IMPLANT
SUT PROLENE 6 0 BV (SUTURE) ×5 IMPLANT
SUT VIC AB 3-0 SH 27 (SUTURE) ×4
SUT VIC AB 3-0 SH 27X BRD (SUTURE) ×1 IMPLANT
SUT VICRYL 4-0 PS2 18IN ABS (SUTURE) ×3 IMPLANT
UNDERPAD 30X30 INCONTINENT (UNDERPADS AND DIAPERS) ×2 IMPLANT
WATER STERILE IRR 1000ML POUR (IV SOLUTION) ×2 IMPLANT

## 2015-09-01 NOTE — Interval H&P Note (Signed)
History and Physical Interval Note:  09/01/2015 12:09 PM  Dale [name]  has presented today for surgery, with the diagnosis of End stage renal disease  The various methods of treatment have been discussed with the patient and family. After consideration of risks, benefits and other options for treatment, the patient has consented to  Procedure(s): SUPERFICIALIZATION LEFT BRACHIOCEPHALIC ARTERIOVENOUS FISTULA (Left) as a surgical intervention .  [name] history has been reviewed, patient examined, no change in status, stable for surgery.  I have reviewed the patient's chart and labs.  Questions were answered to the patient's satisfaction.     [name]

## 2015-09-01 NOTE — Transfer of Care (Signed)
Immediate Anesthesia Transfer of Care Note  Patient: Dale [name]  Procedure(s) Performed: Procedure(s): SUPERFICIALIZATION OF LEFT ARM BRACHIOCEPHALIC ARTERIOVENOUS FISTULA (Left)  Patient Location: PACU  Anesthesia Type:MAC  Level of Consciousness: awake, alert  and oriented  Airway & Oxygen Therapy: Patient Spontanous Breathing and Patient connected to nasal cannula oxygen  Post-op Assessment: Report given to RN, Post -op Vital signs reviewed and stable and Patient moving all extremities  Post vital signs: Reviewed and stable  Last Vitals:  Filed Vitals:   09/01/15 0942  BP: 140/60  Pulse: 79  Temp: 36.6 C  Resp: 18    Last Pain:  Filed Vitals:   09/01/15 0958  PainSc: 7          Complications: No apparent anesthesia complications

## 2015-09-01 NOTE — Anesthesia Preprocedure Evaluation (Addendum)
Anesthesia Evaluation  Patient identified by MRN, date of birth, ID band Patient awake    Reviewed: Allergy & Precautions, NPO status   History of Anesthesia Complications (+) DIFFICULT AIRWAY  Airway Mallampati: II  TM Distance: >3 FB Neck ROM: Full    Dental   Pulmonary shortness of breath, sleep apnea ,    Pulmonary exam normal        Cardiovascular hypertension, Pt. on medications + CAD  Normal cardiovascular exam     Neuro/Psych    GI/Hepatic   Endo/Other  diabetes, Type 2, Insulin Dependent  Renal/GU ESRF and DialysisRenal disease     Musculoskeletal   Abdominal   Peds  Hematology   Anesthesia Other Findings   Reproductive/Obstetrics                           Anesthesia Physical Anesthesia Plan  ASA: III  Anesthesia Plan: MAC   Post-op Pain Management:    Induction: Intravenous  Airway Management Planned: Natural Airway  Additional Equipment:   Intra-op Plan:   Post-operative Plan:   Informed Consent: I have reviewed the patients History and Physical, chart, labs and discussed the procedure including the risks, benefits and alternatives for the proposed anesthesia with the patient or authorized representative who has indicated his/her understanding and acceptance.     Plan Discussed with: CRNA and Surgeon  Anesthesia Plan Comments:         Anesthesia Quick Evaluation

## 2015-09-01 NOTE — Anesthesia Postprocedure Evaluation (Signed)
Anesthesia Post Note  Patient: Dale [name]  Procedure(s) Performed: Procedure(s) (LRB): SUPERFICIALIZATION OF LEFT ARM BRACHIOCEPHALIC ARTERIOVENOUS FISTULA (Left)  Patient location during evaluation: PACU Anesthesia Type: MAC Level of consciousness: awake and alert Pain management: pain level controlled Vital Signs Assessment: post-procedure vital signs reviewed and stable Respiratory status: spontaneous breathing, nonlabored ventilation, respiratory function stable and patient connected to nasal cannula oxygen Cardiovascular status: stable and blood pressure returned to baseline Anesthetic complications: no    Last Vitals:  Filed Vitals:   09/01/15 1615 09/01/15 1627  BP: 102/55 110/49  Pulse: 81 79  Temp:    Resp: 16 20    Last Pain:  Filed Vitals:   09/01/15 1639  PainSc: 4                  Malini Flemings DAVID

## 2015-09-01 NOTE — Op Note (Signed)
    NAME: [name]   MRN: [id number] DOB: 10/25/1965    DATE OF OPERATION: 09/01/2015  PREOP DIAGNOSIS: Poorly functioning left brachiocephalic AV fistula  POSTOP DIAGNOSIS: Same  PROCEDURE: Superficialization of left brachiocephalic AV fistula  SURGEON: Judeth Cornfield. Scot Dock, MD, FACS  ASSIST: RNFA's  ANESTHESIA: local with sedation   EBL: minimal  INDICATIONS: Dale [name] is a 50 y.o. male has a left brachiocephalic AV fistula which has been difficult to cannulate. The only real problem noted on the duplex was that the fistula was somewhat deep. He presents for elective superficialization  FINDINGS: ppre-competing branches were ligated.  TECHNIQUE: The patient was taken to the operative room and sedated by anesthesia. The left upper extremity was prepped and draped in usual sterile fashion. After the skin was anesthetized with function lidocaine, a longitudinal incision was made over the fistula and the fistula was dissected free circumferentially. This was extended down to the arterial anastomosis which appeared to be widely patent.  Distally it was extended to the upper arm. There were 3 competing branches which were divided between clips and 3-0 silk ties. The vein was fully mobilized. Hemostasis was obtained in the wound. The deep layer was closed with running 3-0 Vicryl beneath the layer of the fistula. The skin was then closely 4-0 subcuticular stitch just positioning the vein very superficial. Hemostasis was obtained and a sterile dressing was applied. The patient tolerated the procedure well and was transferred to the recovery room in stable condition. All needle and sponge counts were correct.  [name], MD, FACS [facility]  DATE OF DICTATION:   09/01/2015

## 2015-09-01 NOTE — Anesthesia Procedure Notes (Signed)
Procedure Name: MAC Date/Time: 09/01/2015 12:50 PM Performed by: Izora Gala Pre-anesthesia Checklist: Patient identified Patient Re-evaluated:Patient Re-evaluated prior to inductionOxygen Delivery Method: Nasal cannula Preoxygenation: Pre-oxygenation with 100% oxygen Intubation Type: IV induction Placement Confirmation: positive ETCO2

## 2015-09-01 NOTE — H&P (View-Only) (Signed)
Patient name: Dale [name] MRN: [id number] DOB: [birth year] Sex: male  REASON FOR VISIT: Unable to use AV fistula.  HPI: Dale [name] is a 50 y.o. male who had a left brachiocephalic fistula placed on 04/30/2015. He was last seen in the office on 08/05/2015. At that time, the diameters of the fistula ranged from 0.63-1.0 cm. The vein was somewhat deep in the upper arm. My feeling was that if the vein was difficult to cannulate he may require superficialization. He comes in today and tells me that they have not been able to use his fistula. He denies pain or paresthesias in the left arm.  Current Outpatient Prescriptions  Medication Sig Dispense Refill  . allopurinol (ZYLOPRIM) 100 MG tablet Take 200 mg by mouth daily.     [name] Kitchen aspirin EC 81 MG tablet Take 81 mg by mouth at bedtime.    [name] Kitchen atorvastatin (LIPITOR) 40 MG tablet Take 40 mg by mouth at bedtime.  0  . calcium acetate (PHOSLO) 667 MG capsule Take 1,334-2,001 mg by mouth See admin instructions. Take 3 capsules (2001 mg) by mouth with meals and 2 capsule (1334mg  ) with snacks    . diphenhydrAMINE (BENADRYL) 25 mg capsule Take 1-2 capsules (25-50 mg total) by mouth every 6 (six) hours as needed for itching. (Patient taking differently: Take 25-50 mg by mouth every 6 (six) hours as needed for itching or sleep. ) 30 capsule 0  . docusate sodium (COLACE) 100 MG capsule Take 200 mg by mouth 3 (three) times daily as needed for mild constipation.     . fenofibrate (TRICOR) 48 MG tablet Take 1 tablet (48 mg total) by mouth daily. 30 tablet 0  . fluticasone (FLONASE) 50 MCG/ACT nasal spray Place 2 sprays into both nostrils daily as needed for allergies.     [name] Kitchen gabapentin (NEURONTIN) 100 MG capsule Take 100-200 mg by mouth 2 (two) times daily. Take 100mg  during the day and 200mg  at bedtime.    [name] Kitchen GLUCAGON EMERGENCY 1 MG injection Inject 1 mg into the muscle daily as needed (low blood sugar).     . hydrocortisone 2.5 % cream Apply 1 application  topically 4 (four) times daily as needed (itching).    [name] Kitchen HYDROmorphone (DILAUDID) 2 MG tablet Take 1 tablet (2 mg total) by mouth 3 (three) times daily as needed for severe pain. (Patient taking differently: Take 2 mg by mouth 2 (two) times daily. Take 1 tablet (2 mg) by mouth daily at bedtime, may take an additional dose during the day as needed for severe pain) 20 tablet 0  . Insulin Human (INSULIN PUMP) 100 unit/ml SOLN Inject into the skin continuous. Humalog    . levothyroxine (SYNTHROID, LEVOTHROID) 100 MCG tablet Take 100 mcg by mouth at bedtime.     [name] Kitchen loratadine (CLARITIN) 10 MG tablet Take 10 mg by mouth daily.    . metoCLOPramide (REGLAN) 5 MG tablet Take 5 mg by mouth 4 (four) times daily - after meals and at bedtime.    . midodrine (PROAMATINE) 10 MG tablet Take 10 mg by mouth 3 (three) times a week. On dialysis days    . multivitamin (RENA-VIT) TABS tablet Take 1 tablet by mouth daily.    . nitroGLYCERIN (NITROSTAT) 0.4 MG SL tablet Place 0.4 mg under the tongue every 5 (five) minutes as needed for chest pain.     . promethazine (PHENERGAN) 25 MG tablet Take 1 tablet (25 mg total) by mouth every 6 (six)  hours as needed for nausea or vomiting. For nausea 30 tablet 0  . ranitidine (ZANTAC) 150 MG tablet Take 150 mg by mouth 2 (two) times daily.  4  . rOPINIRole (REQUIP) 2 MG tablet Take 2 mg by mouth daily with supper.     . sevelamer carbonate (RENVELA) 800 MG tablet Take 2,400 mg by mouth 3 (three) times daily with meals.     . furosemide (LASIX) 80 MG tablet Take 80 mg by mouth 2 (two) times daily. Reported on 08/26/2015  98   No current facility-administered medications for this visit.   Facility-Administered Medications Ordered in Other Visits  Medication Dose Route Frequency Provider Last Rate Last Dose  . 0.9 %  sodium chloride infusion   Intravenous Continuous Angelia Mould, MD      . Chlorhexidine Gluconate Cloth 2 % PADS 6 each  6 each Topical Once Angelia Mould, MD        REVIEW OF SYSTEMS:  [X]  denotes positive finding, [ ]  denotes negative finding Cardiac  Comments:  Chest pain or chest pressure:    Shortness of breath upon exertion:    Short of breath when lying flat:    Irregular heart rhythm:    Constitutional    Fever or chills:      PHYSICAL EXAM: Filed Vitals:   08/26/15 1536  BP: 104/59  Pulse: 86  Height: 5\' 9"  (1.753 m)  Weight: 281 lb (127.461 kg)  SpO2: 96%    GENERAL: The patient is a well-nourished male, in no acute distress. The vital signs are documented above. CARDIOVASCULAR: There is a regular rate and rhythm. PULMONARY: There is good air exchange bilaterally without wheezing or rales. The fistula has a good thrill and bruit. It does appear fairly deep in the upper arm.  MEDICAL ISSUES:  STATUS POST LEFT BRACHIOCEPHALIC AV FISTULA: Given that the dialysis center is having problems cannulating the fistula, I have recommended that we superficialize the fistula. There are also a couple competing branches which we will ligate at the same time. There are some elevated velocities at the proximal anastomosis and I'll explore this also encasing needs a patch there. This has been scheduled for 09/01/2015.  [name] [facility] [place]: [phone]

## 2015-09-01 NOTE — Progress Notes (Signed)
When initial CBG was obtained patient reports that he turned his insulin pump to 1/2 basal rate. Notified Dr. Conrad [place] of IStat results and what patient had done with insulin pump no further orders given.

## 2015-09-01 NOTE — Progress Notes (Signed)
Pt. Being transferred to discharge pacu side, bay 20.  Pt. Has insulin pump on at half basal rate.  Insulin pump checked with patient and RN that pump is working and infusing properly, pt reports it is set and working appropriately.  Verified with second RN.

## 2015-09-02 ENCOUNTER — Encounter (HOSPITAL_COMMUNITY): Payer: Self-pay | Admitting: Vascular Surgery

## 2015-09-05 ENCOUNTER — Telehealth: Payer: Self-pay | Admitting: Vascular Surgery

## 2015-09-05 NOTE — Telephone Encounter (Signed)
-----   Message from Denman George, RN sent at 09/01/2015  3:19 PM EDT ----- Regarding: needs 6 wk. f/u with Dr. Scot Dock and a duplex of left arm access to check maturity   ----- Message -----    From: Angelia Mould, MD    Sent: 09/01/2015   2:23 PM      To: [facility] Charge Pool Subject: charge                                         PROCEDURE: Superficialization of left brachiocephalic AV fistula  SURGEON: Judeth Cornfield. Scot Dock, MD, FACS  ASSIST: RNFA's  He will need a follow up visit in a proximally 6 weeks with a duplex at that time to check on the maturation of his fistula. Thank you. CD

## 2015-09-05 NOTE — Telephone Encounter (Signed)
Sched lab 6/20 at 8:00 and MD 6/21 at 1:30. Spoke to pt to inform them.

## 2015-09-08 DIAGNOSIS — E1022 Type 1 diabetes mellitus with diabetic chronic kidney disease: Secondary | ICD-10-CM | POA: Diagnosis not present

## 2015-09-14 DIAGNOSIS — M1711 Unilateral primary osteoarthritis, right knee: Secondary | ICD-10-CM | POA: Diagnosis not present

## 2015-09-17 DIAGNOSIS — M2550 Pain in unspecified joint: Secondary | ICD-10-CM | POA: Diagnosis not present

## 2015-09-23 DIAGNOSIS — N186 End stage renal disease: Secondary | ICD-10-CM | POA: Diagnosis not present

## 2015-09-23 DIAGNOSIS — Z992 Dependence on renal dialysis: Secondary | ICD-10-CM | POA: Diagnosis not present

## 2015-09-23 DIAGNOSIS — T8612 Kidney transplant failure: Secondary | ICD-10-CM | POA: Diagnosis not present

## 2015-09-25 DIAGNOSIS — N186 End stage renal disease: Secondary | ICD-10-CM | POA: Diagnosis not present

## 2015-09-25 DIAGNOSIS — E109 Type 1 diabetes mellitus without complications: Secondary | ICD-10-CM | POA: Diagnosis not present

## 2015-09-25 DIAGNOSIS — D509 Iron deficiency anemia, unspecified: Secondary | ICD-10-CM | POA: Diagnosis not present

## 2015-09-25 DIAGNOSIS — D689 Coagulation defect, unspecified: Secondary | ICD-10-CM | POA: Diagnosis not present

## 2015-09-25 DIAGNOSIS — N2581 Secondary hyperparathyroidism of renal origin: Secondary | ICD-10-CM | POA: Diagnosis not present

## 2015-09-25 DIAGNOSIS — D631 Anemia in chronic kidney disease: Secondary | ICD-10-CM | POA: Diagnosis not present

## 2015-09-29 DIAGNOSIS — E1040 Type 1 diabetes mellitus with diabetic neuropathy, unspecified: Secondary | ICD-10-CM | POA: Diagnosis not present

## 2015-09-29 DIAGNOSIS — E1059 Type 1 diabetes mellitus with other circulatory complications: Secondary | ICD-10-CM | POA: Diagnosis not present

## 2015-10-05 ENCOUNTER — Encounter: Payer: Self-pay | Admitting: Vascular Surgery

## 2015-10-12 DIAGNOSIS — E1065 Type 1 diabetes mellitus with hyperglycemia: Secondary | ICD-10-CM | POA: Diagnosis not present

## 2015-10-13 ENCOUNTER — Ambulatory Visit (HOSPITAL_COMMUNITY)
Admission: RE | Admit: 2015-10-13 | Discharge: 2015-10-13 | Disposition: A | Discharge status: Home / Self Care | Payer: Medicare Other | Source: Ambulatory Visit | Attending: Vascular Surgery | Admitting: Vascular Surgery

## 2015-10-13 DIAGNOSIS — I2510 Atherosclerotic heart disease of native coronary artery without angina pectoris: Secondary | ICD-10-CM | POA: Diagnosis not present

## 2015-10-13 DIAGNOSIS — K219 Gastro-esophageal reflux disease without esophagitis: Secondary | ICD-10-CM | POA: Insufficient documentation

## 2015-10-13 DIAGNOSIS — E785 Hyperlipidemia, unspecified: Secondary | ICD-10-CM | POA: Diagnosis not present

## 2015-10-13 DIAGNOSIS — N186 End stage renal disease: Secondary | ICD-10-CM | POA: Diagnosis not present

## 2015-10-13 DIAGNOSIS — I5032 Chronic diastolic (congestive) heart failure: Secondary | ICD-10-CM | POA: Diagnosis not present

## 2015-10-13 DIAGNOSIS — Z48812 Encounter for surgical aftercare following surgery on the circulatory system: Secondary | ICD-10-CM | POA: Insufficient documentation

## 2015-10-13 DIAGNOSIS — Z794 Long term (current) use of insulin: Secondary | ICD-10-CM | POA: Insufficient documentation

## 2015-10-13 DIAGNOSIS — I132 Hypertensive heart and chronic kidney disease with heart failure and with stage 5 chronic kidney disease, or end stage renal disease: Secondary | ICD-10-CM | POA: Diagnosis not present

## 2015-10-13 DIAGNOSIS — G4733 Obstructive sleep apnea (adult) (pediatric): Secondary | ICD-10-CM | POA: Diagnosis not present

## 2015-10-13 DIAGNOSIS — E1022 Type 1 diabetes mellitus with diabetic chronic kidney disease: Secondary | ICD-10-CM | POA: Diagnosis not present

## 2015-10-14 ENCOUNTER — Ambulatory Visit (INDEPENDENT_AMBULATORY_CARE_PROVIDER_SITE_OTHER): Payer: Medicare Other | Admitting: Vascular Surgery

## 2015-10-14 ENCOUNTER — Encounter: Payer: Medicare Other | Admitting: Vascular Surgery

## 2015-10-14 ENCOUNTER — Encounter: Payer: Self-pay | Admitting: Vascular Surgery

## 2015-10-14 VITALS — BP 114/65 | HR 91 | Ht 69.0 in | Wt 281.0 lb

## 2015-10-14 DIAGNOSIS — N185 Chronic kidney disease, stage 5: Secondary | ICD-10-CM

## 2015-10-14 NOTE — Progress Notes (Signed)
Patient name: Dale [name] MRN: [id number] DOB: [birth year] Sex: male  REASON FOR VISIT: Follow up  HPI: Dale [name] is a 50 y.o. male who underwent superficialization of his left brachiocephalic AV fistula on [id number]. He comes in for a 6 week follow up visit. He has no specific complaints. He denies pain or paresthesias in the left arm. He is using his catheter for dialysis.  Current Outpatient Prescriptions  Medication Sig Dispense Refill  . acetaminophen (TYLENOL) 500 MG tablet Take 1,000 mg by mouth every 6 (six) hours as needed for moderate pain or headache.    . allopurinol (ZYLOPRIM) 100 MG tablet Take 200 mg by mouth daily.     [name] Kitchen aspirin EC 81 MG tablet Take 81 mg by mouth at bedtime.    [name] Kitchen atorvastatin (LIPITOR) 40 MG tablet Take 40 mg by mouth at bedtime.  0  . AURYXIA 1 GM 210 MG(Fe) TABS TK 2 TS TID WITH MEALS  9  . calcium acetate (PHOSLO) 667 MG capsule Take 1,334-2,001 mg by mouth See admin instructions. Take 3 capsules (2001 mg) by mouth with meals and 2 capsule (1334mg  ) with snacks    . diphenhydrAMINE (BENADRYL) 25 mg capsule Take 1-2 capsules (25-50 mg total) by mouth every 6 (six) hours as needed for itching. (Patient taking differently: Take 50 mg by mouth 2 (two) times daily as needed for itching or sleep. ) 30 capsule 0  . docusate sodium (COLACE) 100 MG capsule Take 200 mg by mouth 3 (three) times daily as needed for mild constipation.     . fenofibrate (TRICOR) 48 MG tablet Take 1 tablet (48 mg total) by mouth daily. 30 tablet 0  . fluticasone (FLONASE) 50 MCG/ACT nasal spray Place 2 sprays into both nostrils daily as needed for allergies.     [name] Kitchen gabapentin (NEURONTIN) 100 MG capsule Take 300 mg by mouth 2 (two) times daily.     [name] Kitchen GLUCAGON EMERGENCY 1 MG injection Inject 1 mg into the muscle daily as needed (low blood sugar).     . hydrocortisone 2.5 % cream Apply 1 application topically 4 (four) times daily as needed (itching).    [name] Kitchen HYDROmorphone (DILAUDID) 2  MG tablet Take 1 tablet (2 mg total) by mouth 3 (three) times daily as needed for severe pain. (Patient taking differently: Take 2 mg by mouth 2 (two) times daily. Take 1 tablet (2 mg) by mouth daily at bedtime, may take an additional dose during the day as needed for severe pain) 20 tablet 0  . ibuprofen (ADVIL,MOTRIN) 200 MG tablet Take 400 mg by mouth 2 (two) times daily as needed for moderate pain.    . Insulin Human (INSULIN PUMP) 100 unit/ml SOLN Inject 1 each into the skin continuous. Humalog    . insulin lispro (HUMALOG) 100 UNIT/ML injection Inject 175 Units into the skin continuous. Up to 175 units continuous in pump,  Basal rates area as follows:  2400 1.5 units/hr, 0300 1.9 units/hr, 0800 1.45 units/hr, 1200 1.625 units/hr    . levothyroxine (SYNTHROID, LEVOTHROID) 100 MCG tablet Take 100 mcg by mouth at bedtime.     [name] Kitchen loratadine (CLARITIN) 10 MG tablet Take 10 mg by mouth daily.    . metoCLOPramide (REGLAN) 5 MG tablet Take 5 mg by mouth 4 (four) times daily - after meals and at bedtime.    . midodrine (PROAMATINE) 10 MG tablet Take 20 mg by mouth every Monday, Wednesday, and Friday. On dialysis days    .  multivitamin (RENA-VIT) TABS tablet Take 1 tablet by mouth daily.    . nitroGLYCERIN (NITROSTAT) 0.4 MG SL tablet Place 0.4 mg under the tongue every 5 (five) minutes as needed for chest pain.     [name] Kitchen nystatin (MYCOSTATIN) powder Apply topically 2 (two) times daily as needed.     . promethazine (PHENERGAN) 25 MG tablet Take 1 tablet (25 mg total) by mouth every 6 (six) hours as needed for nausea or vomiting. For nausea 30 tablet 0  . ranitidine (ZANTAC) 150 MG tablet Take 150 mg by mouth 2 (two) times daily.  4  . rOPINIRole (REQUIP) 2 MG tablet Take 2 mg by mouth daily with supper.     [name] Kitchen HYDROmorphone (DILAUDID) 2 MG tablet Take 1 tablet (2 mg total) by mouth every 4 (four) hours as needed for severe pain. (Patient not taking: Reported on 10/14/2015) 30 tablet 0  . sevelamer carbonate  (RENVELA) 800 MG tablet Take 2,400 mg by mouth 3 (three) times daily with meals. Reported on 10/14/2015     No current facility-administered medications for this visit.   Facility-Administered Medications Ordered in Other Visits  Medication Dose Route Frequency Provider Last Rate Last Dose  . 0.9 %  sodium chloride infusion   Intravenous Continuous Angelia Mould, MD      . Chlorhexidine Gluconate Cloth 2 % PADS 6 each  6 each Topical Once Angelia Mould, MD        REVIEW OF SYSTEMS:  [X]  denotes positive finding, [ ]  denotes negative finding Cardiac  Comments:  Chest pain or chest pressure:    Shortness of breath upon exertion:    Short of breath when lying flat:    Irregular heart rhythm:    Constitutional    Fever or chills:      PHYSICAL EXAM: Filed Vitals:   10/14/15 1338  BP: 114/65  Pulse: 91  Height: 5\' 9"  (1.753 m)  Weight: 281 lb (127.461 kg)  SpO2: 95%    GENERAL: The patient is a well-nourished male, in no acute distress. The vital signs are documented above. CARDIOVASCULAR: There is a regular rate and rhythm. PULMONARY: There is good air exchange bilaterally without wheezing or rales. The left upper arm fistula has an excellent thrill. It feels fairly close to the skin and should be easy to cannulate. The incision is healing adequately.  DUPLEX OF AV FISTULA: I have reviewed the duplex of the left brachiocephalic AV fistula that was done 10/13/2015. This shows that the diameters of the fistula range from 0.77-1.13 cm. Depths ranged from 0.35-0.37 cm.  MEDICAL ISSUES:  STATUS POST SUPERFICIALIZATION OF LEFT BRACHIOCEPHALIC AV FISTULA: The depth and diameter of the fistula appeared to be adequate. I think we should give the incision another 4 weeks to heal before they begin cannulating the fistula which is directly beneath the incision. In the meantime we'll continue to use his catheter. I'll see him back as needed.  [name] [facility] [place] [phone]

## 2015-10-15 DIAGNOSIS — M1711 Unilateral primary osteoarthritis, right knee: Secondary | ICD-10-CM | POA: Diagnosis not present

## 2015-10-23 DIAGNOSIS — N186 End stage renal disease: Secondary | ICD-10-CM | POA: Diagnosis not present

## 2015-10-23 DIAGNOSIS — T8612 Kidney transplant failure: Secondary | ICD-10-CM | POA: Diagnosis not present

## 2015-10-23 DIAGNOSIS — Z992 Dependence on renal dialysis: Secondary | ICD-10-CM | POA: Diagnosis not present

## 2015-10-26 DIAGNOSIS — D509 Iron deficiency anemia, unspecified: Secondary | ICD-10-CM | POA: Diagnosis not present

## 2015-10-26 DIAGNOSIS — D689 Coagulation defect, unspecified: Secondary | ICD-10-CM | POA: Diagnosis not present

## 2015-10-26 DIAGNOSIS — D631 Anemia in chronic kidney disease: Secondary | ICD-10-CM | POA: Diagnosis not present

## 2015-10-26 DIAGNOSIS — N186 End stage renal disease: Secondary | ICD-10-CM | POA: Diagnosis not present

## 2015-10-26 DIAGNOSIS — N2581 Secondary hyperparathyroidism of renal origin: Secondary | ICD-10-CM | POA: Diagnosis not present

## 2015-10-26 DIAGNOSIS — E109 Type 1 diabetes mellitus without complications: Secondary | ICD-10-CM | POA: Diagnosis not present

## 2015-10-28 DIAGNOSIS — M2041 Other hammer toe(s) (acquired), right foot: Secondary | ICD-10-CM | POA: Diagnosis not present

## 2015-11-12 DIAGNOSIS — M1711 Unilateral primary osteoarthritis, right knee: Secondary | ICD-10-CM | POA: Diagnosis not present

## 2015-11-12 DIAGNOSIS — G894 Chronic pain syndrome: Secondary | ICD-10-CM | POA: Diagnosis not present

## 2015-11-13 DIAGNOSIS — M1711 Unilateral primary osteoarthritis, right knee: Secondary | ICD-10-CM | POA: Diagnosis not present

## 2015-11-23 DIAGNOSIS — N186 End stage renal disease: Secondary | ICD-10-CM | POA: Diagnosis not present

## 2015-11-23 DIAGNOSIS — T8612 Kidney transplant failure: Secondary | ICD-10-CM | POA: Diagnosis not present

## 2015-11-23 DIAGNOSIS — Z992 Dependence on renal dialysis: Secondary | ICD-10-CM | POA: Diagnosis not present

## 2015-11-25 DIAGNOSIS — D689 Coagulation defect, unspecified: Secondary | ICD-10-CM | POA: Diagnosis not present

## 2015-11-25 DIAGNOSIS — N186 End stage renal disease: Secondary | ICD-10-CM | POA: Diagnosis not present

## 2015-11-25 DIAGNOSIS — E109 Type 1 diabetes mellitus without complications: Secondary | ICD-10-CM | POA: Diagnosis not present

## 2015-11-25 DIAGNOSIS — N2581 Secondary hyperparathyroidism of renal origin: Secondary | ICD-10-CM | POA: Diagnosis not present

## 2015-11-26 DIAGNOSIS — M1711 Unilateral primary osteoarthritis, right knee: Secondary | ICD-10-CM | POA: Diagnosis not present

## 2015-12-09 DIAGNOSIS — B370 Candidal stomatitis: Secondary | ICD-10-CM | POA: Diagnosis not present

## 2015-12-09 DIAGNOSIS — L245 Irritant contact dermatitis due to other chemical products: Secondary | ICD-10-CM | POA: Diagnosis not present

## 2015-12-21 ENCOUNTER — Emergency Department (HOSPITAL_COMMUNITY): Payer: Medicare Other

## 2015-12-21 ENCOUNTER — Inpatient Hospital Stay (HOSPITAL_COMMUNITY)
Admission: EM | Admit: 2015-12-21 | Discharge: 2016-01-04 | DRG: 246 | Disposition: A | Discharge status: Home Health | Payer: Medicare Other | Attending: Internal Medicine | Admitting: Internal Medicine

## 2015-12-21 ENCOUNTER — Encounter (HOSPITAL_COMMUNITY): Payer: Self-pay | Admitting: Emergency Medicine

## 2015-12-21 ENCOUNTER — Inpatient Hospital Stay (HOSPITAL_COMMUNITY): Payer: Medicare Other

## 2015-12-21 DIAGNOSIS — Z888 Allergy status to other drugs, medicaments and biological substances status: Secondary | ICD-10-CM

## 2015-12-21 DIAGNOSIS — Z01818 Encounter for other preprocedural examination: Secondary | ICD-10-CM

## 2015-12-21 DIAGNOSIS — E1065 Type 1 diabetes mellitus with hyperglycemia: Secondary | ICD-10-CM | POA: Diagnosis not present

## 2015-12-21 DIAGNOSIS — M79609 Pain in unspecified limb: Secondary | ICD-10-CM | POA: Diagnosis not present

## 2015-12-21 DIAGNOSIS — Z452 Encounter for adjustment and management of vascular access device: Secondary | ICD-10-CM

## 2015-12-21 DIAGNOSIS — E785 Hyperlipidemia, unspecified: Secondary | ICD-10-CM | POA: Diagnosis present

## 2015-12-21 DIAGNOSIS — K2901 Acute gastritis with bleeding: Secondary | ICD-10-CM | POA: Diagnosis not present

## 2015-12-21 DIAGNOSIS — K2970 Gastritis, unspecified, without bleeding: Secondary | ICD-10-CM | POA: Diagnosis not present

## 2015-12-21 DIAGNOSIS — M79602 Pain in left arm: Secondary | ICD-10-CM | POA: Diagnosis not present

## 2015-12-21 DIAGNOSIS — I2510 Atherosclerotic heart disease of native coronary artery without angina pectoris: Secondary | ICD-10-CM | POA: Diagnosis not present

## 2015-12-21 DIAGNOSIS — I25110 Atherosclerotic heart disease of native coronary artery with unstable angina pectoris: Secondary | ICD-10-CM | POA: Diagnosis not present

## 2015-12-21 DIAGNOSIS — D62 Acute posthemorrhagic anemia: Secondary | ICD-10-CM | POA: Diagnosis not present

## 2015-12-21 DIAGNOSIS — I959 Hypotension, unspecified: Secondary | ICD-10-CM | POA: Diagnosis not present

## 2015-12-21 DIAGNOSIS — T8612 Kidney transplant failure: Secondary | ICD-10-CM | POA: Diagnosis not present

## 2015-12-21 DIAGNOSIS — F458 Other somatoform disorders: Secondary | ICD-10-CM | POA: Diagnosis not present

## 2015-12-21 DIAGNOSIS — R7989 Other specified abnormal findings of blood chemistry: Secondary | ICD-10-CM | POA: Diagnosis not present

## 2015-12-21 DIAGNOSIS — K5901 Slow transit constipation: Secondary | ICD-10-CM | POA: Diagnosis present

## 2015-12-21 DIAGNOSIS — R061 Stridor: Secondary | ICD-10-CM

## 2015-12-21 DIAGNOSIS — E10649 Type 1 diabetes mellitus with hypoglycemia without coma: Secondary | ICD-10-CM | POA: Diagnosis not present

## 2015-12-21 DIAGNOSIS — K219 Gastro-esophageal reflux disease without esophagitis: Secondary | ICD-10-CM | POA: Diagnosis present

## 2015-12-21 DIAGNOSIS — R571 Hypovolemic shock: Secondary | ICD-10-CM | POA: Diagnosis not present

## 2015-12-21 DIAGNOSIS — J42 Unspecified chronic bronchitis: Secondary | ICD-10-CM | POA: Diagnosis not present

## 2015-12-21 DIAGNOSIS — Z992 Dependence on renal dialysis: Secondary | ICD-10-CM

## 2015-12-21 DIAGNOSIS — Z6841 Body Mass Index (BMI) 40.0 and over, adult: Secondary | ICD-10-CM

## 2015-12-21 DIAGNOSIS — I252 Old myocardial infarction: Secondary | ICD-10-CM

## 2015-12-21 DIAGNOSIS — I200 Unstable angina: Secondary | ICD-10-CM | POA: Diagnosis not present

## 2015-12-21 DIAGNOSIS — Z4682 Encounter for fitting and adjustment of non-vascular catheter: Secondary | ICD-10-CM | POA: Diagnosis not present

## 2015-12-21 DIAGNOSIS — I213 ST elevation (STEMI) myocardial infarction of unspecified site: Secondary | ICD-10-CM | POA: Diagnosis not present

## 2015-12-21 DIAGNOSIS — D649 Anemia, unspecified: Secondary | ICD-10-CM | POA: Diagnosis present

## 2015-12-21 DIAGNOSIS — Z7982 Long term (current) use of aspirin: Secondary | ICD-10-CM

## 2015-12-21 DIAGNOSIS — G9340 Encephalopathy, unspecified: Secondary | ICD-10-CM | POA: Diagnosis present

## 2015-12-21 DIAGNOSIS — I132 Hypertensive heart and chronic kidney disease with heart failure and with stage 5 chronic kidney disease, or end stage renal disease: Secondary | ICD-10-CM | POA: Diagnosis present

## 2015-12-21 DIAGNOSIS — Z7901 Long term (current) use of anticoagulants: Secondary | ICD-10-CM | POA: Diagnosis not present

## 2015-12-21 DIAGNOSIS — E871 Hypo-osmolality and hyponatremia: Secondary | ICD-10-CM | POA: Diagnosis present

## 2015-12-21 DIAGNOSIS — N19 Unspecified kidney failure: Secondary | ICD-10-CM | POA: Diagnosis not present

## 2015-12-21 DIAGNOSIS — I248 Other forms of acute ischemic heart disease: Secondary | ICD-10-CM | POA: Diagnosis present

## 2015-12-21 DIAGNOSIS — R040 Epistaxis: Secondary | ICD-10-CM | POA: Diagnosis not present

## 2015-12-21 DIAGNOSIS — I129 Hypertensive chronic kidney disease with stage 1 through stage 4 chronic kidney disease, or unspecified chronic kidney disease: Secondary | ICD-10-CM | POA: Diagnosis not present

## 2015-12-21 DIAGNOSIS — E1129 Type 2 diabetes mellitus with other diabetic kidney complication: Secondary | ICD-10-CM | POA: Diagnosis not present

## 2015-12-21 DIAGNOSIS — R578 Other shock: Secondary | ICD-10-CM | POA: Diagnosis present

## 2015-12-21 DIAGNOSIS — Z955 Presence of coronary angioplasty implant and graft: Secondary | ICD-10-CM

## 2015-12-21 DIAGNOSIS — R079 Chest pain, unspecified: Secondary | ICD-10-CM | POA: Diagnosis not present

## 2015-12-21 DIAGNOSIS — E1022 Type 1 diabetes mellitus with diabetic chronic kidney disease: Secondary | ICD-10-CM | POA: Diagnosis present

## 2015-12-21 DIAGNOSIS — Z886 Allergy status to analgesic agent status: Secondary | ICD-10-CM

## 2015-12-21 DIAGNOSIS — Z881 Allergy status to other antibiotic agents status: Secondary | ICD-10-CM

## 2015-12-21 DIAGNOSIS — Z885 Allergy status to narcotic agent status: Secondary | ICD-10-CM

## 2015-12-21 DIAGNOSIS — I214 Non-ST elevation (NSTEMI) myocardial infarction: Principal | ICD-10-CM | POA: Diagnosis present

## 2015-12-21 DIAGNOSIS — R0600 Dyspnea, unspecified: Secondary | ICD-10-CM | POA: Diagnosis not present

## 2015-12-21 DIAGNOSIS — I2584 Coronary atherosclerosis due to calcified coronary lesion: Secondary | ICD-10-CM | POA: Diagnosis present

## 2015-12-21 DIAGNOSIS — D631 Anemia in chronic kidney disease: Secondary | ICD-10-CM | POA: Diagnosis present

## 2015-12-21 DIAGNOSIS — K921 Melena: Secondary | ICD-10-CM | POA: Diagnosis not present

## 2015-12-21 DIAGNOSIS — R778 Other specified abnormalities of plasma proteins: Secondary | ICD-10-CM | POA: Diagnosis present

## 2015-12-21 DIAGNOSIS — Z89421 Acquired absence of other right toe(s): Secondary | ICD-10-CM

## 2015-12-21 DIAGNOSIS — Z91041 Radiographic dye allergy status: Secondary | ICD-10-CM

## 2015-12-21 DIAGNOSIS — K922 Gastrointestinal hemorrhage, unspecified: Secondary | ICD-10-CM | POA: Diagnosis not present

## 2015-12-21 DIAGNOSIS — K5730 Diverticulosis of large intestine without perforation or abscess without bleeding: Secondary | ICD-10-CM | POA: Diagnosis present

## 2015-12-21 DIAGNOSIS — I9589 Other hypotension: Secondary | ICD-10-CM | POA: Diagnosis present

## 2015-12-21 DIAGNOSIS — R0902 Hypoxemia: Secondary | ICD-10-CM | POA: Diagnosis not present

## 2015-12-21 DIAGNOSIS — M1711 Unilateral primary osteoarthritis, right knee: Secondary | ICD-10-CM | POA: Diagnosis present

## 2015-12-21 DIAGNOSIS — Z89512 Acquired absence of left leg below knee: Secondary | ICD-10-CM

## 2015-12-21 DIAGNOSIS — J9600 Acute respiratory failure, unspecified whether with hypoxia or hypercapnia: Secondary | ICD-10-CM

## 2015-12-21 DIAGNOSIS — J9601 Acute respiratory failure with hypoxia: Secondary | ICD-10-CM | POA: Diagnosis not present

## 2015-12-21 DIAGNOSIS — D509 Iron deficiency anemia, unspecified: Secondary | ICD-10-CM | POA: Diagnosis present

## 2015-12-21 DIAGNOSIS — E872 Acidosis: Secondary | ICD-10-CM | POA: Diagnosis not present

## 2015-12-21 DIAGNOSIS — I10 Essential (primary) hypertension: Secondary | ICD-10-CM | POA: Diagnosis present

## 2015-12-21 DIAGNOSIS — N2581 Secondary hyperparathyroidism of renal origin: Secondary | ICD-10-CM | POA: Diagnosis present

## 2015-12-21 DIAGNOSIS — J9690 Respiratory failure, unspecified, unspecified whether with hypoxia or hypercapnia: Secondary | ICD-10-CM | POA: Diagnosis not present

## 2015-12-21 DIAGNOSIS — E039 Hypothyroidism, unspecified: Secondary | ICD-10-CM | POA: Diagnosis present

## 2015-12-21 DIAGNOSIS — R490 Dysphonia: Secondary | ICD-10-CM | POA: Diagnosis not present

## 2015-12-21 DIAGNOSIS — J398 Other specified diseases of upper respiratory tract: Secondary | ICD-10-CM | POA: Diagnosis present

## 2015-12-21 DIAGNOSIS — N186 End stage renal disease: Secondary | ICD-10-CM | POA: Diagnosis not present

## 2015-12-21 DIAGNOSIS — Z91048 Other nonmedicinal substance allergy status: Secondary | ICD-10-CM

## 2015-12-21 DIAGNOSIS — E875 Hyperkalemia: Secondary | ICD-10-CM | POA: Diagnosis present

## 2015-12-21 DIAGNOSIS — G4733 Obstructive sleep apnea (adult) (pediatric): Secondary | ICD-10-CM | POA: Diagnosis present

## 2015-12-21 DIAGNOSIS — M109 Gout, unspecified: Secondary | ICD-10-CM | POA: Diagnosis present

## 2015-12-21 DIAGNOSIS — Z794 Long term (current) use of insulin: Secondary | ICD-10-CM | POA: Diagnosis not present

## 2015-12-21 DIAGNOSIS — Z89519 Acquired absence of unspecified leg below knee: Secondary | ICD-10-CM

## 2015-12-21 DIAGNOSIS — M797 Fibromyalgia: Secondary | ICD-10-CM | POA: Diagnosis present

## 2015-12-21 DIAGNOSIS — I5032 Chronic diastolic (congestive) heart failure: Secondary | ICD-10-CM | POA: Diagnosis present

## 2015-12-21 DIAGNOSIS — Z9641 Presence of insulin pump (external) (internal): Secondary | ICD-10-CM | POA: Diagnosis present

## 2015-12-21 DIAGNOSIS — J9811 Atelectasis: Secondary | ICD-10-CM | POA: Diagnosis not present

## 2015-12-21 DIAGNOSIS — R0989 Other specified symptoms and signs involving the circulatory and respiratory systems: Secondary | ICD-10-CM | POA: Diagnosis not present

## 2015-12-21 DIAGNOSIS — F40240 Claustrophobia: Secondary | ICD-10-CM | POA: Diagnosis present

## 2015-12-21 LAB — BASIC METABOLIC PANEL
Anion gap: 17 — ABNORMAL HIGH (ref 5–15)
Anion gap: 20 — ABNORMAL HIGH (ref 5–15)
Anion gap: 18 — ABNORMAL HIGH (ref 5–15)
BUN: 160 mg/dL — ABNORMAL HIGH (ref 6–20)
BUN: 167 mg/dL — ABNORMAL HIGH (ref 6–20)
BUN: 152 mg/dL — ABNORMAL HIGH (ref 6–20)
CO2: 17 mmol/L — ABNORMAL LOW (ref 22–32)
CO2: 16 mmol/L — ABNORMAL LOW (ref 22–32)
CO2: 17 mmol/L — ABNORMAL LOW (ref 22–32)
Calcium: 7.5 mg/dL — ABNORMAL LOW (ref 8.9–10.3)
Calcium: 7.7 mg/dL — ABNORMAL LOW (ref 8.9–10.3)
Calcium: 7.4 mg/dL — ABNORMAL LOW (ref 8.9–10.3)
Chloride: 97 mmol/L — ABNORMAL LOW (ref 101–111)
Chloride: 94 mmol/L — ABNORMAL LOW (ref 101–111)
Chloride: 96 mmol/L — ABNORMAL LOW (ref 101–111)
Creatinine, Ser: 9.39 mg/dL — ABNORMAL HIGH (ref 0.61–1.24)
Creatinine, Ser: 9.7 mg/dL — ABNORMAL HIGH (ref 0.61–1.24)
Creatinine, Ser: 8.78 mg/dL — ABNORMAL HIGH (ref 0.61–1.24)
GFR calc Af Amer: 7 mL/min — ABNORMAL LOW (ref >60)
GFR calc Af Amer: 6 mL/min — ABNORMAL LOW (ref >60)
GFR calc Af Amer: 7 mL/min — ABNORMAL LOW (ref >60)
GFR calc non Af Amer: 6 mL/min — ABNORMAL LOW (ref >60)
GFR calc non Af Amer: 6 mL/min — ABNORMAL LOW (ref >60)
GFR calc non Af Amer: 6 mL/min — ABNORMAL LOW (ref >60)
Glucose, Bld: 393 mg/dL — ABNORMAL HIGH (ref 65–99)
Glucose, Bld: 422 mg/dL — ABNORMAL HIGH (ref 65–99)
Glucose, Bld: 411 mg/dL — ABNORMAL HIGH (ref 65–99)
Potassium: 6 mmol/L — ABNORMAL HIGH (ref 3.5–5.1)
Potassium: 7.1 mmol/L (ref 3.5–5.1)
Potassium: 5.7 mmol/L — ABNORMAL HIGH (ref 3.5–5.1)
Sodium: 131 mmol/L — ABNORMAL LOW (ref 135–145)
Sodium: 130 mmol/L — ABNORMAL LOW (ref 135–145)
Sodium: 131 mmol/L — ABNORMAL LOW (ref 135–145)

## 2015-12-21 LAB — PREPARE RBC (CROSSMATCH)

## 2015-12-21 LAB — COMPREHENSIVE METABOLIC PANEL
ALT: 14 U/L — ABNORMAL LOW (ref 17–63)
AST: 16 U/L (ref 15–41)
Albumin: 2.9 g/dL — ABNORMAL LOW (ref 3.5–5.0)
Alkaline Phosphatase: 47 U/L (ref 38–126)
Anion gap: 20 — ABNORMAL HIGH (ref 5–15)
BUN: 219 mg/dL — ABNORMAL HIGH (ref 6–20)
CO2: 20 mmol/L — ABNORMAL LOW (ref 22–32)
Calcium: 9.1 mg/dL (ref 8.9–10.3)
Chloride: 91 mmol/L — ABNORMAL LOW (ref 101–111)
Creatinine, Ser: 12.37 mg/dL — ABNORMAL HIGH (ref 0.61–1.24)
GFR calc Af Amer: 5 mL/min — ABNORMAL LOW (ref >60)
GFR calc non Af Amer: 4 mL/min — ABNORMAL LOW (ref >60)
Glucose, Bld: 267 mg/dL — ABNORMAL HIGH (ref 65–99)
Potassium: 7.1 mmol/L (ref 3.5–5.1)
Sodium: 131 mmol/L — ABNORMAL LOW (ref 135–145)
Total Bilirubin: 0.4 mg/dL (ref 0.3–1.2)
Total Protein: 6.1 g/dL — ABNORMAL LOW (ref 6.5–8.1)

## 2015-12-21 LAB — GLUCOSE, CAPILLARY
Glucose-Capillary: 278 mg/dL — ABNORMAL HIGH (ref 65–99)
Glucose-Capillary: 432 mg/dL — ABNORMAL HIGH (ref 65–99)
Glucose-Capillary: 421 mg/dL — ABNORMAL HIGH (ref 65–99)
Glucose-Capillary: 319 mg/dL — ABNORMAL HIGH (ref 65–99)
Glucose-Capillary: 241 mg/dL — ABNORMAL HIGH (ref 65–99)
Glucose-Capillary: 197 mg/dL — ABNORMAL HIGH (ref 65–99)
Glucose-Capillary: 159 mg/dL — ABNORMAL HIGH (ref 65–99)

## 2015-12-21 LAB — CBC
HCT: 23.1 % — ABNORMAL LOW (ref 39.0–52.0)
Hemoglobin: 7.6 g/dL — ABNORMAL LOW (ref 13.0–17.0)
MCH: 32.2 pg (ref 26.0–34.0)
MCHC: 32.9 g/dL (ref 30.0–36.0)
MCV: 97.9 fL (ref 78.0–100.0)
Platelets: 232 10*3/uL (ref 150–400)
RBC: 2.36 MIL/uL — ABNORMAL LOW (ref 4.22–5.81)
RDW: 15.9 % — ABNORMAL HIGH (ref 11.5–15.5)
WBC: 13.8 10*3/uL — ABNORMAL HIGH (ref 4.0–10.5)

## 2015-12-21 LAB — CBC WITH DIFFERENTIAL/PLATELET
Basophils Absolute: 0 10*3/uL (ref 0.0–0.1)
Basophils Relative: 0 %
Eosinophils Absolute: 0.5 10*3/uL (ref 0.0–0.7)
Eosinophils Relative: 4 %
HCT: 21.5 % — ABNORMAL LOW (ref 39.0–52.0)
Hemoglobin: 6.9 g/dL — CL (ref 13.0–17.0)
Lymphocytes Relative: 17 %
Lymphs Abs: 2.2 10*3/uL (ref 0.7–4.0)
MCH: 32.7 pg (ref 26.0–34.0)
MCHC: 32.1 g/dL (ref 30.0–36.0)
MCV: 101.9 fL — ABNORMAL HIGH (ref 78.0–100.0)
Monocytes Absolute: 0.4 10*3/uL (ref 0.1–1.0)
Monocytes Relative: 3 %
Neutro Abs: 9.8 10*3/uL — ABNORMAL HIGH (ref 1.7–7.7)
Neutrophils Relative %: 76 %
Platelets: 259 10*3/uL (ref 150–400)
RBC: 2.11 MIL/uL — ABNORMAL LOW (ref 4.22–5.81)
RDW: 16.2 % — ABNORMAL HIGH (ref 11.5–15.5)
WBC: 12.8 10*3/uL — ABNORMAL HIGH (ref 4.0–10.5)

## 2015-12-21 LAB — MRSA PCR SCREENING: MRSA by PCR: NEGATIVE

## 2015-12-21 LAB — PROTIME-INR
INR: 1.2
Prothrombin Time: 15.3 seconds — ABNORMAL HIGH (ref 11.4–15.2)

## 2015-12-21 LAB — POC OCCULT BLOOD, ED: Fecal Occult Bld: POSITIVE — AB

## 2015-12-21 LAB — BLOOD GAS, ARTERIAL
Acid-base deficit: 9.3 mmol/L — ABNORMAL HIGH (ref 0.0–2.0)
Bicarbonate: 15.6 mEq/L — ABNORMAL LOW (ref 20.0–24.0)
Drawn by: 406621
FIO2: 100
MECHVT: 600 mL
O2 Saturation: 98.6 %
PEEP: 5 cmH2O
Patient temperature: 96.6
RATE: 26 resp/min
TCO2: 16.6 mmol/L (ref 0–100)
pCO2 arterial: 29.4 mmHg — ABNORMAL LOW (ref 35.0–45.0)
pH, Arterial: 7.338 — ABNORMAL LOW (ref 7.350–7.450)
pO2, Arterial: 462 mmHg — ABNORMAL HIGH (ref 80.0–100.0)

## 2015-12-21 LAB — TROPONIN I
Troponin I: 0.26 ng/mL (ref <0.03)
Troponin I: 3.47 ng/mL (ref <0.03)
Troponin I: 7.94 ng/mL (ref <0.03)

## 2015-12-21 LAB — I-STAT TROPONIN, ED: Troponin i, poc: 0.16 ng/mL (ref 0.00–0.08)

## 2015-12-21 LAB — RENAL FUNCTION PANEL
Albumin: 2.7 g/dL — ABNORMAL LOW (ref 3.5–5.0)
Anion gap: 18 — ABNORMAL HIGH (ref 5–15)
BUN: 160 mg/dL — ABNORMAL HIGH (ref 6–20)
CO2: 18 mmol/L — ABNORMAL LOW (ref 22–32)
Calcium: 7.6 mg/dL — ABNORMAL LOW (ref 8.9–10.3)
Chloride: 95 mmol/L — ABNORMAL LOW (ref 101–111)
Creatinine, Ser: 9.21 mg/dL — ABNORMAL HIGH (ref 0.61–1.24)
GFR calc Af Amer: 7 mL/min — ABNORMAL LOW (ref >60)
GFR calc non Af Amer: 6 mL/min — ABNORMAL LOW (ref >60)
Glucose, Bld: 438 mg/dL — ABNORMAL HIGH (ref 65–99)
Phosphorus: 5.8 mg/dL — ABNORMAL HIGH (ref 2.5–4.6)
Potassium: 6.5 mmol/L (ref 3.5–5.1)
Sodium: 131 mmol/L — ABNORMAL LOW (ref 135–145)

## 2015-12-21 LAB — HEMOGLOBIN AND HEMATOCRIT, BLOOD
HCT: 29.5 % — ABNORMAL LOW (ref 39.0–52.0)
HCT: 28.5 % — ABNORMAL LOW (ref 39.0–52.0)
Hemoglobin: 9.7 g/dL — ABNORMAL LOW (ref 13.0–17.0)
Hemoglobin: 9.5 g/dL — ABNORMAL LOW (ref 13.0–17.0)

## 2015-12-21 LAB — LACTIC ACID, PLASMA: Lactic Acid, Venous: 3 mmol/L (ref 0.5–1.9)

## 2015-12-21 LAB — CBG MONITORING, ED: Glucose-Capillary: 355 mg/dL — ABNORMAL HIGH (ref 65–99)

## 2015-12-21 LAB — TSH: TSH: 9.388 u[IU]/mL — ABNORMAL HIGH (ref 0.350–4.500)

## 2015-12-21 LAB — LIPASE, BLOOD: Lipase: 81 U/L — ABNORMAL HIGH (ref 11–51)

## 2015-12-21 LAB — CORTISOL: Cortisol, Plasma: 6.5 ug/dL

## 2015-12-21 MED ORDER — SODIUM CHLORIDE 0.9 % IV SOLN
INTRAVENOUS | Status: DC
  Administered 2015-12-21: 3.7 [IU]/h via INTRAVENOUS
  Administered 2015-12-22: 0.8 [IU]/h via INTRAVENOUS
  Filled 2015-12-21: qty 2.5

## 2015-12-21 MED ORDER — FENTANYL CITRATE (PF) 100 MCG/2ML IJ SOLN
INTRAMUSCULAR | Status: AC
  Administered 2015-12-21: 100 ug via INTRAVENOUS
  Filled 2015-12-21: qty 2

## 2015-12-21 MED ORDER — SODIUM CHLORIDE 0.9 % IV SOLN
25.0000 ug/h | INTRAVENOUS | Status: DC
  Administered 2015-12-21: 250 ug/h via INTRAVENOUS
  Administered 2015-12-21 (×2): 100 ug/h via INTRAVENOUS
  Administered 2015-12-22: 300 ug/h via INTRAVENOUS
  Administered 2015-12-22: 350 ug/h via INTRAVENOUS
  Administered 2015-12-22: 275 ug/h via INTRAVENOUS
  Administered 2015-12-23: 350 ug/h via INTRAVENOUS
  Filled 2015-12-21 (×7): qty 50

## 2015-12-21 MED ORDER — FENTANYL CITRATE (PF) 100 MCG/2ML IJ SOLN
50.0000 ug | Freq: Once | INTRAMUSCULAR | Status: AC
  Administered 2015-12-21: 50 ug via INTRAVENOUS
  Filled 2015-12-21: qty 2

## 2015-12-21 MED ORDER — ONDANSETRON HCL 4 MG/2ML IJ SOLN
4.0000 mg | Freq: Once | INTRAMUSCULAR | Status: AC
  Administered 2015-12-21: 4 mg via INTRAVENOUS
  Filled 2015-12-21: qty 2

## 2015-12-21 MED ORDER — ORAL CARE MOUTH RINSE
15.0000 mL | Freq: Four times a day (QID) | OROMUCOSAL | Status: DC
  Administered 2015-12-21 – 2015-12-24 (×10): 15 mL via OROMUCOSAL

## 2015-12-21 MED ORDER — ASPIRIN EC 325 MG PO TBEC: 325.0000 mg | DELAYED_RELEASE_TABLET | Freq: Once | ORAL | Status: DC

## 2015-12-21 MED ORDER — SODIUM CHLORIDE 0.9 % IV SOLN: Freq: Once | INTRAVENOUS | Status: DC

## 2015-12-21 MED ORDER — ETOMIDATE 2 MG/ML IV SOLN
20.0000 mg | Freq: Once | INTRAVENOUS | Status: AC
  Administered 2015-12-21: 20 mg via INTRAVENOUS

## 2015-12-21 MED ORDER — FENTANYL CITRATE (PF) 100 MCG/2ML IJ SOLN
50.0000 ug | Freq: Once | INTRAMUSCULAR | Status: DC
  Filled 2015-12-21: qty 2

## 2015-12-21 MED ORDER — PANTOPRAZOLE SODIUM 40 MG IV SOLR
8.0000 mg/h | INTRAVENOUS | Status: DC
  Administered 2015-12-21 – 2015-12-24 (×7): 8 mg/h via INTRAVENOUS
  Filled 2015-12-21 (×14): qty 80

## 2015-12-21 MED ORDER — SODIUM CHLORIDE 0.9 % IV SOLN
INTRAVENOUS | Status: DC
  Administered 2015-12-21 (×2): via INTRAVENOUS

## 2015-12-21 MED ORDER — MIDAZOLAM HCL 2 MG/2ML IJ SOLN
2.0000 mg | INTRAMUSCULAR | Status: DC | PRN
  Administered 2015-12-21 – 2015-12-23 (×13): 2 mg via INTRAVENOUS
  Filled 2015-12-21 (×12): qty 2

## 2015-12-21 MED ORDER — PRISMASOL BGK 0/2.5 32-2.5 MEQ/L IV SOLN
INTRAVENOUS | Status: DC
  Administered 2015-12-21 – 2015-12-22 (×5): via INTRAVENOUS_CENTRAL
  Filled 2015-12-21 (×17): qty 5000

## 2015-12-21 MED ORDER — SODIUM CHLORIDE 0.9 % IV SOLN: 250.0000 mL | INTRAVENOUS | Status: DC | PRN

## 2015-12-21 MED ORDER — INSULIN ASPART 100 UNIT/ML ~~LOC~~ SOLN
0.0000 [IU] | SUBCUTANEOUS | Status: DC
  Administered 2015-12-21: 5 [IU] via SUBCUTANEOUS

## 2015-12-21 MED ORDER — MIDAZOLAM HCL 2 MG/2ML IJ SOLN
2.0000 mg | INTRAMUSCULAR | Status: DC | PRN
  Administered 2015-12-21: 2 mg via INTRAVENOUS
  Filled 2015-12-21 (×2): qty 2

## 2015-12-21 MED ORDER — LORAZEPAM 2 MG/ML IJ SOLN
INTRAMUSCULAR | Status: AC
  Administered 2015-12-21: 0.5 mg via INTRAVENOUS
  Filled 2015-12-21: qty 1

## 2015-12-21 MED ORDER — FENTANYL BOLUS VIA INFUSION
50.0000 ug | INTRAVENOUS | Status: DC | PRN
  Administered 2015-12-21: 100 ug via INTRAVENOUS
  Filled 2015-12-21: qty 50

## 2015-12-21 MED ORDER — PANTOPRAZOLE SODIUM 40 MG PO PACK
40.0000 mg | PACK | Freq: Every day | ORAL | Status: DC
  Filled 2015-12-21: qty 20

## 2015-12-21 MED ORDER — PANTOPRAZOLE SODIUM 40 MG IV SOLR: 40.0000 mg | Freq: Two times a day (BID) | INTRAVENOUS | Status: DC

## 2015-12-21 MED ORDER — MIDAZOLAM HCL 2 MG/2ML IJ SOLN
INTRAMUSCULAR | Status: AC
  Administered 2015-12-21: 2 mg via INTRAVENOUS
  Filled 2015-12-21: qty 2

## 2015-12-21 MED ORDER — NOREPINEPHRINE BITARTRATE 1 MG/ML IV SOLN
2.0000 ug/min | INTRAVENOUS | Status: DC
  Administered 2015-12-21 (×2): 10 ug/min via INTRAVENOUS
  Administered 2015-12-22: 4 ug/min via INTRAVENOUS
  Filled 2015-12-21 (×3): qty 4

## 2015-12-21 MED ORDER — PANTOPRAZOLE SODIUM 40 MG IV SOLR
80.0000 mg | Freq: Once | INTRAVENOUS | Status: AC
  Administered 2015-12-21: 80 mg via INTRAVENOUS
  Filled 2015-12-21: qty 80

## 2015-12-21 MED ORDER — LEVOTHYROXINE SODIUM 100 MCG IV SOLR
50.0000 ug | Freq: Every day | INTRAVENOUS | Status: DC
  Administered 2015-12-21 – 2015-12-23 (×3): 50 ug via INTRAVENOUS
  Filled 2015-12-21 (×4): qty 5

## 2015-12-21 MED ORDER — MIDAZOLAM HCL 2 MG/2ML IJ SOLN
INTRAMUSCULAR | Status: AC
  Filled 2015-12-21: qty 2

## 2015-12-21 MED ORDER — ETOMIDATE 2 MG/ML IV SOLN
INTRAVENOUS | Status: AC
  Filled 2015-12-21: qty 10

## 2015-12-21 MED ORDER — HEPARIN SODIUM (PORCINE) 5000 UNIT/ML IJ SOLN
5000.0000 [IU] | Freq: Three times a day (TID) | INTRAMUSCULAR | Status: DC
Start: 2015-12-21 — End: 2015-12-21

## 2015-12-21 MED ORDER — CHLORHEXIDINE GLUCONATE 0.12 % MT SOLN
15.0000 mL | Freq: Two times a day (BID) | OROMUCOSAL | Status: DC
  Administered 2015-12-21 – 2015-12-23 (×5): 15 mL via OROMUCOSAL
  Filled 2015-12-21: qty 15

## 2015-12-21 MED ORDER — SODIUM CHLORIDE 0.9 % FOR CRRT
INTRAVENOUS_CENTRAL | Status: DC | PRN
Start: 2015-12-21 — End: 2015-12-26
  Filled 2015-12-21: qty 1000

## 2015-12-21 MED ORDER — SODIUM CHLORIDE 0.9 % IV SOLN: INTRAVENOUS | Status: DC

## 2015-12-21 MED ORDER — PRISMASOL BGK 0/2.5 32-2.5 MEQ/L IV SOLN
INTRAVENOUS | Status: DC
  Administered 2015-12-21: 16:00:00 via INTRAVENOUS_CENTRAL
  Filled 2015-12-21 (×4): qty 5000

## 2015-12-21 MED ORDER — LORAZEPAM 2 MG/ML IJ SOLN
0.5000 mg | Freq: Once | INTRAMUSCULAR | Status: AC
  Administered 2015-12-21: 0.5 mg via INTRAVENOUS

## 2015-12-21 MED ORDER — HEPARIN SODIUM (PORCINE) 1000 UNIT/ML DIALYSIS
1000.0000 [IU] | INTRAMUSCULAR | Status: DC | PRN
  Administered 2015-12-22: 4000 [IU] via INTRAVENOUS_CENTRAL
  Filled 2015-12-21 (×2): qty 6

## 2015-12-21 MED ORDER — PRISMASOL BGK 0/2.5 32-2.5 MEQ/L IV SOLN
INTRAVENOUS | Status: DC
  Administered 2015-12-21: 16:00:00 via INTRAVENOUS_CENTRAL
  Filled 2015-12-21 (×2): qty 5000

## 2015-12-21 MED FILL — Medication: Qty: 1 | Status: AC

## 2015-12-21 NOTE — ED Provider Notes (Signed)
[facility] DEPT Provider Note   CSN: [id number] Arrival date & time: 12/21/15  0554     History   Chief Complaint Chief Complaint  Patient presents with  . Arm Pain  . Chest Pain    HPI Dale [name] is a 50 y.o. male.  The history is provided by the patient. No language interpreter was used.  Arm Pain  Associated symptoms include chest pain.  Chest Pain      Dale [name] is a 50 y.o. male who presents to the Emergency Department complaining of chest pain, arm pain.  He presents for left sided arm pain and chest pain that began last night. Last night he developed pain in his left arm at the side of his knee fistula. The pain lasted about 20 minutes and then resolved. After the pain began he noted that the fistula did not have a strong of a pulse as it normally does. The pain resolved but the pulse strength continued. He describes the pulse is more flat than usual. This morning he developed central and left upper chest discomfort along with recurrent pain in the left arm. The chest discomfort he states is similar to prior reflux symptoms. He also has an epigastric burning sensation. He endorses associated nausea and increased shortness of breath. He dialyzes Monday, Wednesday, Friday. Last dialysis was on Friday and it was a full session without difficulties. He denies any fevers. He experiences ongoing intermittent diaphoresis. No vomiting, diarrhea. He has a vascular catheter in his right anterior chest wall but receives dialysis through the fistula in the left upper extremity.  Past Medical History:  Diagnosis Date  . Anemia of chronic disease   . Arthritis    "hands, right knee" (03/19/2014)  . CAD (coronary artery disease)    a. Lexiscan Myoview (11/15):  Inf, apical cap and apical lateral ischemia, EF 56%, inf HK, Moderate Risk;  b. LHC (11/15):  mid to dist Dx 80%, mid RCA 99% (functional CTO), dist RCA 80% >> PCI: balloon angioplasty to mid RCA (could not deliver  stent) - plan staged PCI with HSRA of RCA; Dx to be tx medically >> PCI: Rotoblator atherectomy/DES to [facility]  . Cholecystitis    a. 08/27/2011  . Chronic diastolic CHF (congestive heart failure) ([place])    a. Echo 3/13:  EF 55-60%;  b. Echo (11/15):  Mild LVH, EF 60-65%, no RWMA, Gr 1 DD, MAC, mild LAE, normal RVF, mild RAE;  c.  Echo 5/16:  severe LVH, EF 55-60%, no RWMA, Gr 1 DD, MAC, mild LAE  . Claustrophobia    when things get around his face.   . Constipation   . Constipation   . Difficult intubation    only once in 2015 at [facility] haven't had a problem since then  . ESRD (end stage renal disease) ([place])    a. 1995 s/p cadaveric transplant w/ susbequent failure after 18 yrs;  b.Dialysis initiated 07/2011 [facility] M-W-F  . Fibromyalgia   . GERD (gastroesophageal reflux disease)   . Gout    PMH  . History of blood transfusion   . HLD (hyperlipidemia)   . Hypertension   . Hypothyroidism   . Inguinal lymphadenopathy    a. bilateral - s/p biopsy 07/2011  . Insulin dependent diabetes mellitus (HCC)    Type I  . OSA (obstructive sleep apnea)    adjustable bed  . Pericardial effusion    a.  Small by CT 08/22/11;  b.  Large by CT 08/27/11  . Pneumonia ~ 2007  . Shortness of breath dyspnea    too much Fluid    Patient Active Problem List   Diagnosis Date Noted  . Pulmonary edema 03/09/2015  . Fluid overload 03/09/2015  . Chronic pain 03/09/2015  . Hyperkalemia 03/09/2015  . Hypoglycemia 03/09/2015  . Diabetes mellitus ([place]) 03/09/2015  . Chest pain at rest   . Accelerated hypertension 02/24/2015  . DM (diabetes mellitus) type II controlled with renal manifestation ([place]) 02/24/2015  . Acute pulmonary edema ([place]) 01/21/2015  . Acute dyspnea 01/18/2015  . Volume overload 01/18/2015  . HLD (hyperlipidemia) 11/27/2014  . Hypoxia 09/30/2014  . Dizziness 09/30/2014  . Hypotension 09/30/2014  . ESRD (end stage renal disease) on dialysis ([place]) M-W-F 09/30/2014  . CAD (coronary  artery disease) 03/20/2014  . Allergic reaction to dye 03/20/2014  . Unstable angina ([place]) 03/19/2014  . HTN (hypertension) 02/18/2014  . Influenza with other respiratory manifestations 06/17/2013  . Acute respiratory failure with hypoxia ([place]) 06/16/2013  . Tracheobronchitis 06/16/2013  . Salmonella bacteremia 07/10/2012  . Salmonella gastroenteritis 07/10/2012  . Acute respiratory failure ([place]) 07/04/2012  . Septic shock ([place]) 07/04/2012  . Metabolic acidosis 0000000  . Fever and chills 07/03/2012  . Diarrhea 07/03/2012  . Nausea 07/03/2012  . Leukocytosis 07/03/2012  . AKI (acute kidney injury) ([place]) 07/03/2012  . Chest pain 06/18/2012  . Swelling of arm 03/19/2012  . Colitis, ischemic ([place]) 08/31/2011  . ESRD (end stage renal disease) ([place]) 08/27/2011  . Abdominal pain 08/27/2011  . Pericardial effusion 08/27/2011  . Constipation, acute 08/23/2011  . History of renal transplant 07/22/2011  . S/P BKA (below knee amputation) ([place]) 07/22/2011  . Obstructive sleep apnea 07/22/2011  . Dyspnea 07/22/2011  . Chronic diastolic CHF (congestive heart failure) ([place]) 07/22/2011  . Anemia 07/22/2011  . Diabetes mellitus with nephropathy ([place]) 07/22/2011    Past Surgical History:  Procedure Laterality Date  . AMPUTATION OF REPLICATED TOES Right   . ANGIOPLASTY  04/09/2014   RCA   . APPENDECTOMY  ~ 2006  . ARTERIOVENOUS GRAFT PLACEMENT Left 1993?   forearm  . ARTERIOVENOUS GRAFT PLACEMENT Right 1993?   leg  . ARTERIOVENOUS GRAFT PLACEMENT Left    Thigh  . Arteriovenous Graft Removed Left    Thigh  . AV FISTULA PLACEMENT  07/29/2011   Procedure: ARTERIOVENOUS (AV) FISTULA CREATION;  Surgeon: Mal Misty, MD;  Location: [facility];  Service: Vascular;  Laterality: Right;  Brachial cephalic  . AV FISTULA PLACEMENT Left 01/20/2015   Procedure: LIGATION OF RIGHT ARTERIOVENOUS FISTULA WITH EXCISION OF ANEURYSM;  Surgeon: Angelia Mould, MD;  Location: [facility];  Service: Vascular;   Laterality: Left;  . AV FISTULA PLACEMENT Left 04/30/2015   Procedure: CREATION OF LEFT ARM BRACHIO-CEPHALIC ARTERIOVENOUS (AV) FISTULA ;  Surgeon: Angelia Mould, MD;  Location: [facility];  Service: Vascular;  Laterality: Left;  . AV Graft PLACEMENT Left 1993?   "attempted one in my wrist; didn't take"  . BELOW KNEE LEG AMPUTATION Left 2010  . BELOW KNEE LEG AMPUTATION Left   . CARDIAC CATHETERIZATION  03/19/2014  . CARDIAC CATHETERIZATION  03/19/2014   Procedure: CORONARY BALLOON ANGIOPLASTY;  Surgeon: Burnell Blanks, MD;  Location: [facility] CATH LAB;  Service: Cardiovascular;;  . CARPAL TUNNEL RELEASE Bilateral after 2005  . CARPAL TUNNEL RELEASE Bilateral   . CATARACT EXTRACTION W/ INTRAOCULAR LENS  IMPLANT, BILATERAL Bilateral 1990's  . COLONOSCOPY  08/29/2011  Procedure: COLONOSCOPY;  Surgeon: Jerene Bears, MD;  Location: [facility];  Service: Gastroenterology;  Laterality: N/A;  . CORONARY ANGIOPLASTY WITH STENT PLACEMENT  04/09/2014       ptca/des mid lad   . EYE SURGERY Bilateral    cataract  . FEMORAL ARTERY REPAIR  04/09/2014   ANGIOSEAL  . INSERTION OF DIALYSIS CATHETER  08/01/2011   Procedure: INSERTION OF DIALYSIS CATHETER;  Surgeon: Rosetta Posner, MD;  Location: [facility];  Service: Vascular;  Laterality: Right;  insertion of dialysis catheter on right internal jugular vein  . INSERTION OF DIALYSIS CATHETER Right 01/20/2015   Procedure: INSERTION OF DIALYSIS CATHETER - RIGHT INTERNAL JUGULAR ;  Surgeon: Angelia Mould, MD;  Location: [place];  Service: Vascular;  Laterality: Right;  . KIDNEY TRANSPLANT  04/25/1993  . LEFT HEART CATHETERIZATION WITH CORONARY ANGIOGRAM N/A 03/19/2014   Procedure: LEFT HEART CATHETERIZATION WITH CORONARY ANGIOGRAM;  Surgeon: Burnell Blanks, MD;  Location: [facility] CATH LAB;  Service: Cardiovascular;  Laterality: N/A;  . LYMPH NODE BIOPSY  08/10/2011   Procedure: LYMPH NODE BIOPSY;  Surgeon: Merrie Roof, MD;  Location: [facility];  Service:  General;  Laterality: Left;  left groin lymph biopsy  . NEUROPLASTY / TRANSPOSITION ULNAR NERVE AT ELBOW Left after 2005  . PERCUTANEOUS CORONARY ROTOBLATOR INTERVENTION (PCI-R) N/A 04/09/2014   Procedure: PERCUTANEOUS CORONARY ROTOBLATOR INTERVENTION (PCI-R);  Surgeon: Burnell Blanks, MD;  Location: [facility] CATH LAB;  Service: Cardiovascular;  Laterality: N/A;  . PERIPHERAL VASCULAR CATHETERIZATION N/A 12/25/2014   Procedure: Upper Extremity Venography;  Surgeon: Conrad Scotland, MD;  Location: [facility] CV LAB;  Service: Cardiovascular;  Laterality: N/A;  . PERITONEAL CATHETER INSERTION    . PERITONEAL CATHETER REMOVAL    . REVISON OF ARTERIOVENOUS FISTULA Right [id number]   Procedure: PLICATION / REVISION OF ARTERIOVENOUS FISTULA;  Surgeon: Angelia Mould, MD;  Location: [facility];  Service: Vascular;  Laterality: Right;  . REVISON OF ARTERIOVENOUS FISTULA Left 09/01/2015   Procedure: SUPERFICIALIZATION OF LEFT ARM BRACHIOCEPHALIC ARTERIOVENOUS FISTULA;  Surgeon: Angelia Mould, MD;  Location: [facility];  Service: Vascular;  Laterality: Left;  . SHOULDER ARTHROSCOPY Left ~ 2006   frozen  . TOE AMPUTATION Bilateral after 2005  . UMBILICAL HERNIA REPAIR  1990's  . VENOGRAM Right 07/28/2011   Procedure: VENOGRAM;  Surgeon: Conrad Tappahannock, MD;  Location: [facility] CATH LAB;  Service: Cardiovascular;  Laterality: Right;  . VITRECTOMY Bilateral        Home Medications    Prior to Admission medications   Medication Sig Start Date End Date Taking? Authorizing Provider  acetaminophen (TYLENOL) 500 MG tablet Take 1,000 mg by mouth every 6 (six) hours as needed for moderate pain or headache.    Historical Provider, MD  allopurinol (ZYLOPRIM) 100 MG tablet Take 200 mg by mouth daily.  03/14/12   Historical Provider, MD  aspirin EC 81 MG tablet Take 81 mg by mouth at bedtime.    Historical Provider, MD  atorvastatin (LIPITOR) 40 MG tablet Take 40 mg by mouth at bedtime. 02/13/15   Historical Provider,  MD  AURYXIA 1 GM 210 MG(Fe) TABS TK 2 TS TID WITH MEALS 09/19/15   Historical Provider, MD  calcium acetate (PHOSLO) 667 MG capsule Take 1,334-2,001 mg by mouth See admin instructions. Take 3 capsules (2001 mg) by mouth with meals and 2 capsule (1334mg  ) with snacks 09/19/12   Historical Provider, MD  diphenhydrAMINE (BENADRYL) 25 mg capsule Take 1-2  capsules (25-50 mg total) by mouth every 6 (six) hours as needed for itching. Patient taking differently: Take 50 mg by mouth 2 (two) times daily as needed for itching or sleep.  04/11/14   Evelene Croon Barrett, PA-C  docusate sodium (COLACE) 100 MG capsule Take 200 mg by mouth 3 (three) times daily as needed for mild constipation.     Historical Provider, MD  fenofibrate (TRICOR) 48 MG tablet Take 1 tablet (48 mg total) by mouth daily. 02/27/15   Shanker Kristeen Mans, MD  fluticasone (FLONASE) 50 MCG/ACT nasal spray Place 2 sprays into both nostrils daily as needed for allergies.     Historical Provider, MD  gabapentin (NEURONTIN) 100 MG capsule Take 300 mg by mouth 2 (two) times daily.     Historical Provider, MD  GLUCAGON EMERGENCY 1 MG injection Inject 1 mg into the muscle daily as needed (low blood sugar).  01/19/12   Historical Provider, MD  hydrocortisone 2.5 % cream Apply 1 application topically 4 (four) times daily as needed (itching).    Historical Provider, MD  HYDROmorphone (DILAUDID) 2 MG tablet Take 1 tablet (2 mg total) by mouth 3 (three) times daily as needed for severe pain. Patient taking differently: Take 2 mg by mouth 2 (two) times daily. Take 1 tablet (2 mg) by mouth daily at bedtime, may take an additional dose during the day as needed for severe pain 11/20/14   Alvia Grove, PA-C  HYDROmorphone (DILAUDID) 2 MG tablet Take 1 tablet (2 mg total) by mouth every 4 (four) hours as needed for severe pain. Patient not taking: Reported on 10/14/2015 09/01/15   Angelia Mould, MD  ibuprofen (ADVIL,MOTRIN) 200 MG tablet Take 400 mg by mouth 2  (two) times daily as needed for moderate pain.    Historical Provider, MD  Insulin Human (INSULIN PUMP) 100 unit/ml SOLN Inject 1 each into the skin continuous. Humalog    Historical Provider, MD  insulin lispro (HUMALOG) 100 UNIT/ML injection Inject 175 Units into the skin continuous. Up to 175 units continuous in pump,  Basal rates area as follows:  2400 1.5 units/hr, 0300 1.9 units/hr, 0800 1.45 units/hr, 1200 1.625 units/hr    Historical Provider, MD  levothyroxine (SYNTHROID, LEVOTHROID) 100 MCG tablet Take 100 mcg by mouth at bedtime.     Historical Provider, MD  loratadine (CLARITIN) 10 MG tablet Take 10 mg by mouth daily.    Historical Provider, MD  metoCLOPramide (REGLAN) 5 MG tablet Take 5 mg by mouth 4 (four) times daily - after meals and at bedtime. 05/26/11   Historical Provider, MD  midodrine (PROAMATINE) 10 MG tablet Take 20 mg by mouth every Monday, Wednesday, and Friday. On dialysis days    Historical Provider, MD  multivitamin (RENA-VIT) TABS tablet Take 1 tablet by mouth daily.    Historical Provider, MD  nitroGLYCERIN (NITROSTAT) 0.4 MG SL tablet Place 0.4 mg under the tongue every 5 (five) minutes as needed for chest pain.     [name], MD  nystatin (MYCOSTATIN) powder Apply topically 2 (two) times daily as needed.  04/17/15   Historical Provider, MD  promethazine (PHENERGAN) 25 MG tablet Take 1 tablet (25 mg total) by mouth every 6 (six) hours as needed for nausea or vomiting. For nausea 11/27/14   Liliane Shi, PA-C  ranitidine (ZANTAC) 150 MG tablet Take 150 mg by mouth 2 (two) times daily. 11/01/14   Historical Provider, MD  rOPINIRole (REQUIP) 2 MG tablet Take 2 mg  by mouth daily with supper.     Historical Provider, MD  sevelamer carbonate (RENVELA) 800 MG tablet Take 2,400 mg by mouth 3 (three) times daily with meals. Reported on 10/14/2015    Historical Provider, MD    Family History Family History  Problem Relation Age of Onset  . Colon polyps Father   . Diabetes  Father   . Hypertension Father   . Hypertension Mother   . Diabetes Mother   . Hyperlipidemia Mother   . Breast cancer Maternal Aunt   . Cancer Maternal Aunt     Breast and Bone  . Malignant hyperthermia Neg Hx   . Heart attack Neg Hx   . Stroke Neg Hx     Social History Social History  Substance Use Topics  . Smoking status: Never Smoker  . Smokeless tobacco: Never Used  . Alcohol use Yes     Allergies   Contrast media [iodinated diagnostic agents]; Iodides; Ioxaglate; Rocephin [ceftriaxone]; Amoxicillin; Buprenorphine hcl; Clopidogrel; Enalapril; Hydrocodone; Morphine; Morphine and related; Adhesive [tape]; Ciprofloxacin; Clindamycin/lincomycin; Codeine; Doxycycline; Enalaprilat; Levofloxacin; Lincomycin hcl; Mobic [meloxicam]; Oxycodone; Plavix [clopidogrel bisulfate]; and Vasotec   Review of Systems Review of Systems  Cardiovascular: Positive for chest pain.  All other systems reviewed and are negative.    Physical Exam Updated Vital Signs BP 132/62   Pulse 95   Temp 97.8 F (36.6 C) (Oral)   Resp 16   Ht 5\' 8"  (1.727 m)   Wt 281 lb (127.5 kg)   SpO2 100%   BMI 42.73 kg/m   Physical Exam  Constitutional: He is oriented to person, place, and time. He appears well-developed and well-nourished.  Ill appearing  HENT:  Head: Normocephalic and atraumatic.  Cardiovascular: Normal rate and regular rhythm.   No murmur heard. Pulmonary/Chest: Effort normal and breath sounds normal. No respiratory distress.  Abdominal:  Obese abdomen with mild epigastric tenderness  Genitourinary:  Genitourinary Comments: nontender rectal, black stool  Musculoskeletal:  2+ radial pulses bilaterally. There is mild swelling to the right distal upper extremity. The left upper extremity has multiple scars from prior fistulas. There is a fistula in the left upper extremity with a palpable pulse and an audible bruit.  Left BKA. Mild swelling to the right lower extremity.    Neurological: He is alert and oriented to person, place, and time.  Skin: Skin is warm and dry.  Psychiatric: He has a normal mood and affect. His behavior is normal.  Nursing note and vitals reviewed.    ED Treatments / Results  Labs (all labs ordered are listed, but only abnormal results are displayed) Labs Reviewed  COMPREHENSIVE METABOLIC PANEL  CBC WITH DIFFERENTIAL/PLATELET  LIPASE, BLOOD  I-STAT TROPOININ, ED    EKG  EKG Interpretation  Date/Time:  Monday December 21 2015 06:04:07 EDT Ventricular Rate:  98 PR Interval:    QRS Duration: 98 QT Interval:  353 QTC Calculation: 451 R Axis:   54 Text Interpretation:  Sinus rhythm Low voltage, precordial leads Confirmed by Hazle Coca [phone]) on 12/21/2015 6:06:55 AM       Radiology No results found.  Procedures Procedures (including critical care time)  Medications Ordered in ED Medications  fentaNYL (SUBLIMAZE) injection 50 mcg (not administered)     Initial Impression / Assessment and Plan / ED Course  I have reviewed the triage vital signs and the nursing notes.  Pertinent labs & imaging results that were available during my care of the patient were reviewed by me and  considered in my medical decision making (see chart for details).  Clinical Course  Value Comment By Time  Comment: NOTIFIED PHYSICIAN (Reviewed) Margette Fast, MD 08/28 [phone]   Pt with ESRD on HD here with chest pain, pain at LUE fistula.  Ill appearing on exam.  EKG with no acute ST changes and chest pain resolved after fentanyl x 1.  Hgb low at 6.9, black stool on exam.  Pt endorses chronic black stool due to medications.  ASA held in setting of possible GI bleed.  Pt is concerned that his LUE fistula is no longer functioning due to pain and less strong pulse.  Pulse and bruit are present at fistula site, will obtain duplex to further evaluate.  Pt care transferred to Dr. Laverta Baltimore pending additional studies.    Final Clinical Impressions(s) / ED  Diagnoses   Final diagnoses:  Gastrointestinal hemorrhage with melena  Encounter for central line placement    New Prescriptions New Prescriptions   No medications on file     Quintella Reichert, MD 12/22/15 1023

## 2015-12-21 NOTE — Progress Notes (Addendum)
CRITICAL VALUE ALERT  Critical value received: Troponin 7.94  Date of notification:  12/21/15  Time of notification:  2152  Critical value read back: yes  Nurse who received alert:  Arnell Sieving  MD notified (1st page):  Eddie Dibbles, RN/ CCM  Time of first page:  2153  MD notified (2nd page):  Time of second page:  Responding MD:  Eddie Dibbles, RN/ CCM  Time MD responded:  2153

## 2015-12-21 NOTE — Consult Note (Signed)
[facility] Renal Consultation Note    Indication for Consultation:  Management of ESRD/hemodialysis; anemia, hypertension/volume and secondary hyperparathyroidism PCP:  HPI: Dale [name] is a 50 y.o. male with ESRD on hemodialysis MWF at [facility]. PMH significant for Type 1 DM, chronic diastolic HF, CAD (PTA to mid RCA) cholecystitis, PNA, GERD, fibromyalgia, morbid obesity, L BKA, anemia of chronic disease, SHPT.   Patient states he started having pain in his L arm yesterday, "I thought my fistula had stopped working" with associated nausea, diaphoresis, weakness and DOE. He states he did not come to ED because the pain went away. Says pain started again this AM around 0500 when he awoke to go to HD. He presented to ED for evaluation.  EKG on arrival to ED showed NSR.  HGB on arrival to ED was 6.9, K+ 7.0. Scr 12.37 BUN 219, Ca 9.1, Co2 20, WBC 12.8. CXR shows no pulmonary vascular congestion or consolidation, atelectasis L base. He has been brought to HD unit for urgent HD.  Patient denies hematemesis, hematochezia, states he takes ferric citrate binders and that his stools are always black. States he hasn't eaten anything since yesterday. Denies abdominal pain, flank pain, fever, chills, no sick contacts. Denies syncope, C/O headache now, no dizziness, vertigo, no syncopal episodes or falls.    Patient became extremely anxious, C/O chest tightness, gesticulating area across upper chest radiating down L arm, SOB. Pain waxes and wanes. Stat EKG done-shows ST without ST elevation/depression. On O2 via nasal cannula, sats 100%.  BP soft, given NS. HD started on 0.0K bath for 30 minutes. Patient became increasingly more anxious. Insulin pump stopped. BS 365. Rapid response RN called. Patient continues to decline. Repeat K+ 5.1. HD stopped, prepare for intubation and transport to ICU. Patient successfully orally intubated per Dr. Titus Mould, being bagged on 100%.  Transported to MICU.  Called wife, Dale [name]  notified of all parameters.    Past Medical History:  Diagnosis Date  . Anemia of chronic disease   . Arthritis    "hands, right knee" (03/19/2014)  . CAD (coronary artery disease)    a. Lexiscan Myoview (11/15):  Inf, apical cap and apical lateral ischemia, EF 56%, inf HK, Moderate Risk;  b. LHC (11/15):  mid to dist Dx 80%, mid RCA 99% (functional CTO), dist RCA 80% >> PCI: balloon angioplasty to mid RCA (could not deliver stent) - plan staged PCI with HSRA of RCA; Dx to be tx medically >> PCI: Rotoblator atherectomy/DES to [facility]  . Cholecystitis    a. 08/27/2011  . Chronic diastolic CHF (congestive heart failure) ([place])    a. Echo 3/13:  EF 55-60%;  b. Echo (11/15):  Mild LVH, EF 60-65%, no RWMA, Gr 1 DD, MAC, mild LAE, normal RVF, mild RAE;  c.  Echo 5/16:  severe LVH, EF 55-60%, no RWMA, Gr 1 DD, MAC, mild LAE  . Claustrophobia    when things get around his face.   . Constipation   . Constipation   . Difficult intubation    only once in 2015 at [facility] haven't had a problem since then  . ESRD (end stage renal disease) ([place])    a. 1995 s/p cadaveric transplant w/ susbequent failure after 18 yrs;  b.Dialysis initiated 07/2011 [facility] M-W-F  . Fibromyalgia   . GERD (gastroesophageal reflux disease)   . Gout    PMH  . History of blood transfusion   . HLD (hyperlipidemia)   .  Hypertension   . Hypothyroidism   . Inguinal lymphadenopathy    a. bilateral - s/p biopsy 07/2011  . Insulin dependent diabetes mellitus (HCC)    Type I  . OSA (obstructive sleep apnea)    adjustable bed  . Pericardial effusion    a.  Small by CT 08/22/11;  b.  Large by CT 08/27/11  . Pneumonia ~ 2007  . Shortness of breath dyspnea    too much Fluid   Past Surgical History:  Procedure Laterality Date  . AMPUTATION OF REPLICATED TOES Right   . ANGIOPLASTY  04/09/2014   RCA   . APPENDECTOMY  ~ 2006  . ARTERIOVENOUS GRAFT PLACEMENT Left 1993?    forearm  . ARTERIOVENOUS GRAFT PLACEMENT Right 1993?   leg  . ARTERIOVENOUS GRAFT PLACEMENT Left    Thigh  . Arteriovenous Graft Removed Left    Thigh  . AV FISTULA PLACEMENT  07/29/2011   Procedure: ARTERIOVENOUS (AV) FISTULA CREATION;  Surgeon: Mal Misty, MD;  Location: [place];  Service: Vascular;  Laterality: Right;  Brachial cephalic  . AV FISTULA PLACEMENT Left 01/20/2015   Procedure: LIGATION OF RIGHT ARTERIOVENOUS FISTULA WITH EXCISION OF ANEURYSM;  Surgeon: Angelia Mould, MD;  Location: [name];  Service: Vascular;  Laterality: Left;  . AV FISTULA PLACEMENT Left 04/30/2015   Procedure: CREATION OF LEFT ARM BRACHIO-CEPHALIC ARTERIOVENOUS (AV) FISTULA ;  Surgeon: Angelia Mould, MD;  Location: [place];  Service: Vascular;  Laterality: Left;  . AV Graft PLACEMENT Left 1993?   "attempted one in my wrist; didn't take"  . BELOW KNEE LEG AMPUTATION Left 2010  . BELOW KNEE LEG AMPUTATION Left   . CARDIAC CATHETERIZATION  03/19/2014  . CARDIAC CATHETERIZATION  03/19/2014   Procedure: CORONARY BALLOON ANGIOPLASTY;  Surgeon: Burnell Blanks, MD;  Location: [facility] CATH LAB;  Service: Cardiovascular;;  . CARPAL TUNNEL RELEASE Bilateral after 2005  . CARPAL TUNNEL RELEASE Bilateral   . CATARACT EXTRACTION W/ INTRAOCULAR LENS  IMPLANT, BILATERAL Bilateral 1990's  . COLONOSCOPY  08/29/2011   Procedure: COLONOSCOPY;  Surgeon: Jerene Bears, MD;  Location: [place];  Service: Gastroenterology;  Laterality: N/A;  . CORONARY ANGIOPLASTY WITH STENT PLACEMENT  04/09/2014       ptca/des mid lad   . EYE SURGERY Bilateral    cataract  . FEMORAL ARTERY REPAIR  04/09/2014   ANGIOSEAL  . INSERTION OF DIALYSIS CATHETER  08/01/2011   Procedure: INSERTION OF DIALYSIS CATHETER;  Surgeon: Rosetta Posner, MD;  Location: [place];  Service: Vascular;  Laterality: Right;  insertion of dialysis catheter on right internal jugular vein  . INSERTION OF DIALYSIS CATHETER Right 01/20/2015   Procedure:  INSERTION OF DIALYSIS CATHETER - RIGHT INTERNAL JUGULAR ;  Surgeon: Angelia Mould, MD;  Location: [place];  Service: Vascular;  Laterality: Right;  . KIDNEY TRANSPLANT  04/25/1993  . LEFT HEART CATHETERIZATION WITH CORONARY ANGIOGRAM N/A 03/19/2014   Procedure: LEFT HEART CATHETERIZATION WITH CORONARY ANGIOGRAM;  Surgeon: Burnell Blanks, MD;  Location[facility] C[facility] LAB;  Service: Cardiovascular;  Laterality: N/A;  . LYMPH NODE BIOPSY  08/10/2011   Procedure: LYMPH NODE BIOPSY;  Surgeon: Merrie Roof, MD;  Location: [place];  Service: General;  Laterality: Left;  left groin lymph biopsy  . NEUROPLASTY / TRANSPOSITION ULNAR NERVE AT ELBOW Left after 2005  . PERCUTANEOUS CORONARY ROTOBLATOR INTERVENTION (PCI-R) N/A 04/09/2014   Procedure: PERCUTANEOUS CORONARY ROTOBLATOR INTERVENTION (PCI-R);  Surgeon: Burnell Blanks, MD;  Location: [place] CATH LAB;  Service: Cardiovascular;  Laterality: N/A;  . PERIPHERAL VASCULAR CATHETERIZATION N/A 12/25/2014   Procedure: Upper Extremity Venography;  Surgeon: Conrad Halma, MD;  Location: [place] CV LAB;  Service: Cardiovascular;  Laterality: N/A;  . PERITONEAL CATHETER INSERTION    . PERITONEAL CATHETER REMOVAL    . REVISON OF ARTERIOVENOUS FISTULA Right [id number]   Procedure: PLICATION / REVISION OF ARTERIOVENOUS FISTULA;  Surgeon: Angelia Mould, MD;  Location: [place];  Service: Vascular;  Laterality: Right;  . REVISON OF ARTERIOVENOUS FISTULA Left 09/01/2015   Procedure: SUPERFICIALIZATION OF LEFT ARM BRACHIOCEPHALIC ARTERIOVENOUS FISTULA;  Surgeon: Angelia Mould, MD;  Location: [place];  Service: Vascular;  Laterality: Left;  . SHOULDER ARTHROSCOPY Left ~ 2006   frozen  . TOE AMPUTATION Bilateral after 2005  . UMBILICAL HERNIA REPAIR  1990's  . VENOGRAM Right 07/28/2011   Procedure: VENOGRAM;  Surgeon: Conrad Drain, MD;  Location: [facility] CATH LAB;  Service: Cardiovascular;  Laterality: Right;  . VITRECTOMY Bilateral    Family History   Problem Relation Age of Onset  . Colon polyps Father   . Diabetes Father   . Hypertension Father   . Hypertension Mother   . Diabetes Mother   . Hyperlipidemia Mother   . Breast cancer Maternal Aunt   . Cancer Maternal Aunt     Breast and Bone  . Malignant hyperthermia Neg Hx   . Heart attack Neg Hx   . Stroke Neg Hx    Social History:  reports that he has never smoked. He has never used smokeless tobacco. He reports that he drinks alcohol. He reports that he does not use drugs. Allergies  Allergen Reactions  . Contrast Media [Iodinated Diagnostic Agents] Rash and Itching    Systemic itching and transient rash appearance within hours of receiving contrast  Systemic itching and transient rash appearance within hours of receiving contrast  Redman Syndrome  . Iodides Rash  . Ioxaglate Rash  . Rocephin [Ceftriaxone] Itching  . Amoxicillin Itching and Rash    .Has patient had a PCN reaction causing immediate rash, facial/tongue/throat swelling, SOB or lightheadedness with hypotension: Yes Has patient had a PCN reaction causing severe rash involving mucus membranes or skin necrosis: No Has patient had a PCN reaction that required hospitalization No Has patient had a PCN reaction occurring within the last 10 years: No If all of the above answers are "NO", then may proceed with Cephalosporin use. Has patient had a PCN reaction causing immediate rash, facial/tongue/throat swelling, SOB or lightheadedness with hypotension: Yes Has patient had a PCN reaction causing severe rash involving mucus membranes or skin necrosis: No Has patient had a PCN reaction that required hospitalization No Has patient had a PCN reaction occurring within the last 10 years: No If all of the above answers are "NO", then may proceed with Cephalosporin use.  . Buprenorphine Hcl Nausea Only and Hives  . Clopidogrel Rash  . Enalapril Rash    cough  . Hydrocodone Rash  . Morphine Nausea Only, Rash and Hives  .  Morphine And Related Hives and Nausea Only  . Adhesive [Tape] Other (See Comments) and Itching    Blisters on arm from adhesive tape at dialysis Blisters on arm from adhesive tape at dialysis  . Ciprofloxacin Itching and Rash  . Clindamycin/Lincomycin Hives and Itching  . Codeine Hives, Itching and Rash  . Doxycycline Rash  . Enalaprilat     Other reaction(s): Cough (ALLERGY/intolerance)  .  Levofloxacin Hives and Itching  . Lincomycin Hcl Itching and Hives  . Mobic [Meloxicam] Hives and Rash  . Oxycodone Rash  . Plavix [Clopidogrel Bisulfate] Rash  . Vasotec Cough   Prior to Admission medications   Medication Sig Start Date End Date Taking? Authorizing Provider  acetaminophen (TYLENOL) 500 MG tablet Take 1,000 mg by mouth every 6 (six) hours as needed for moderate pain or headache.   Yes Historical Provider, MD  allopurinol (ZYLOPRIM) 100 MG tablet Take 200 mg by mouth daily.  03/14/12  Yes Historical Provider, MD  aspirin EC 81 MG tablet Take 81 mg by mouth at bedtime.   Yes Historical Provider, MD  atorvastatin (LIPITOR) 40 MG tablet Take 40 mg by mouth at bedtime. 02/13/15  Yes Historical Provider, MD  calcium acetate (PHOSLO) 667 MG capsule Take 1,334-2,001 mg by mouth See admin instructions. Take 3 capsules (2001 mg) by mouth with meals and 2 capsule (1334mg  ) with snacks 09/19/12  Yes Historical Provider, MD  diphenhydrAMINE (BENADRYL) 25 mg capsule Take 1-2 capsules (25-50 mg total) by mouth every 6 (six) hours as needed for itching. Patient taking differently: Take 50 mg by mouth 2 (two) times daily as needed for itching or sleep.  04/11/14  Yes Rhonda G Barrett, PA-C  fenofibrate (TRICOR) 48 MG tablet Take 1 tablet (48 mg total) by mouth daily. 02/27/15  Yes Shanker Kristeen Mans, MD  Ferric Citrate (AURYXIA) 1 GM 210 MG(Fe) TABS Take 2 g by mouth 3 (three) times daily with meals.   Yes Historical Provider, MD  gabapentin (NEURONTIN) 300 MG capsule Take 300 mg by mouth 3 (three) times  daily.   Yes Historical Provider, MD  HYDROmorphone (DILAUDID) 2 MG tablet Take 1 tablet (2 mg total) by mouth 3 (three) times daily as needed for severe pain. Patient taking differently: Take 2 mg by mouth 2 (two) times daily. Take 1 tablet (2 mg) by mouth daily at bedtime, may take an additional dose during the day as needed for severe pain 11/20/14  Yes Alvia Grove, PA-C  ibuprofen (ADVIL,MOTRIN) 200 MG tablet Take 400 mg by mouth 2 (two) times daily as needed for moderate pain.   Yes Historical Provider, MD  Insulin Human (INSULIN PUMP) 100 unit/ml SOLN Inject 1 each into the skin continuous. Humalog   Yes Historical Provider, MD  insulin lispro (HUMALOG) 100 UNIT/ML injection Inject 175 Units into the skin continuous. Up to 175 units continuous in pump,  Basal rates area as follows:  2400 1.5 units/hr, 0300 1.9 units/hr, 0800 1.45 units/hr, 1200 1.625 units/hr   Yes Historical Provider, MD  levothyroxine (SYNTHROID, LEVOTHROID) 100 MCG tablet Take 100 mcg by mouth at bedtime.    Yes Historical Provider, MD  loratadine (CLARITIN) 10 MG tablet Take 10 mg by mouth daily.   Yes Historical Provider, MD  loratadine-pseudoephedrine (CLARITIN-D 12-HOUR) 5-120 MG tablet Take 1 tablet by mouth 2 (two) times daily.   Yes Historical Provider, MD  metoCLOPramide (REGLAN) 5 MG tablet Take 5 mg by mouth 4 (four) times daily - after meals and at bedtime. 05/26/11  Yes Historical Provider, MD  midodrine (PROAMATINE) 10 MG tablet Take 20 mg by mouth every Monday, Wednesday, and Friday. On dialysis days   Yes Historical Provider, MD  multivitamin (RENA-VIT) TABS tablet Take 1 tablet by mouth daily.   Yes Historical Provider, MD  nitroGLYCERIN (NITROSTAT) 0.4 MG SL tablet Place 0.4 mg under the tongue every 5 (five) minutes as needed for chest pain.  Yes [name], MD  omeprazole (PRILOSEC) 20 MG capsule Take 20 mg by mouth daily.   Yes Historical Provider, MD  promethazine (PHENERGAN) 25 MG tablet Take 1  tablet (25 mg total) by mouth every 6 (six) hours as needed for nausea or vomiting. For nausea 11/27/14  Yes Liliane Shi, PA-C  rOPINIRole (REQUIP) 2 MG tablet Take 2 mg by mouth daily with supper.    Yes Historical Provider, MD  GLUCAGON EMERGENCY 1 MG injection Inject 1 mg into the muscle daily as needed (low blood sugar).  01/19/12   Historical Provider, MD  HYDROmorphone (DILAUDID) 2 MG tablet Take 1 tablet (2 mg total) by mouth every 4 (four) hours as needed for severe pain. Patient not taking: Reported on 10/14/2015 09/01/15   Angelia Mould, MD   Current Facility-Administered Medications  Medication Dose Route Frequency Provider Last Rate Last Dose  . 0.9 %  sodium chloride infusion   Intravenous Once Margette Fast, MD       Current Outpatient Prescriptions  Medication Sig Dispense Refill  . acetaminophen (TYLENOL) 500 MG tablet Take 1,000 mg by mouth every 6 (six) hours as needed for moderate pain or headache.    . allopurinol (ZYLOPRIM) 100 MG tablet Take 200 mg by mouth daily.     [name] Kitchen aspirin EC 81 MG tablet Take 81 mg by mouth at bedtime.    [name] Kitchen atorvastatin (LIPITOR) 40 MG tablet Take 40 mg by mouth at bedtime.  0  . calcium acetate (PHOSLO) 667 MG capsule Take 1,334-2,001 mg by mouth See admin instructions. Take 3 capsules (2001 mg) by mouth with meals and 2 capsule (1334mg  ) with snacks    . diphenhydrAMINE (BENADRYL) 25 mg capsule Take 1-2 capsules (25-50 mg total) by mouth every 6 (six) hours as needed for itching. (Patient taking differently: Take 50 mg by mouth 2 (two) times daily as needed for itching or sleep. ) 30 capsule 0  . fenofibrate (TRICOR) 48 MG tablet Take 1 tablet (48 mg total) by mouth daily. 30 tablet 0  . Ferric Citrate (AURYXIA) 1 GM 210 MG(Fe) TABS Take 2 g by mouth 3 (three) times daily with meals.    . gabapentin (NEURONTIN) 300 MG capsule Take 300 mg by mouth 3 (three) times daily.    [name] Kitchen HYDROmorphone (DILAUDID) 2 MG tablet Take 1 tablet (2 mg total) by  mouth 3 (three) times daily as needed for severe pain. (Patient taking differently: Take 2 mg by mouth 2 (two) times daily. Take 1 tablet (2 mg) by mouth daily at bedtime, may take an additional dose during the day as needed for severe pain) 20 tablet 0  . ibuprofen (ADVIL,MOTRIN) 200 MG tablet Take 400 mg by mouth 2 (two) times daily as needed for moderate pain.    . Insulin Human (INSULIN PUMP) 100 unit/ml SOLN Inject 1 each into the skin continuous. Humalog    . insulin lispro (HUMALOG) 100 UNIT/ML injection Inject 175 Units into the skin continuous. Up to 175 units continuous in pump,  Basal rates area as follows:  2400 1.5 units/hr, 0300 1.9 units/hr, 0800 1.45 units/hr, 1200 1.625 units/hr    . levothyroxine (SYNTHROID, LEVOTHROID) 100 MCG tablet Take 100 mcg by mouth at bedtime.     [name] Kitchen loratadine (CLARITIN) 10 MG tablet Take 10 mg by mouth daily.    [name] Kitchen loratadine-pseudoephedrine (CLARITIN-D 12-HOUR) 5-120 MG tablet Take 1 tablet by mouth 2 (two) times daily.    . metoCLOPramide (REGLAN)  5 MG tablet Take 5 mg by mouth 4 (four) times daily - after meals and at bedtime.    . midodrine (PROAMATINE) 10 MG tablet Take 20 mg by mouth every Monday, Wednesday, and Friday. On dialysis days    . multivitamin (RENA-VIT) TABS tablet Take 1 tablet by mouth daily.    . nitroGLYCERIN (NITROSTAT) 0.4 MG SL tablet Place 0.4 mg under the tongue every 5 (five) minutes as needed for chest pain.     [name] Kitchen omeprazole (PRILOSEC) 20 MG capsule Take 20 mg by mouth daily.    . promethazine (PHENERGAN) 25 MG tablet Take 1 tablet (25 mg total) by mouth every 6 (six) hours as needed for nausea or vomiting. For nausea 30 tablet 0  . rOPINIRole (REQUIP) 2 MG tablet Take 2 mg by mouth daily with supper.     [name] Kitchen GLUCAGON EMERGENCY 1 MG injection Inject 1 mg into the muscle daily as needed (low blood sugar).     [name] Kitchen HYDROmorphone (DILAUDID) 2 MG tablet Take 1 tablet (2 mg total) by mouth every 4 (four) hours as needed for severe pain.  (Patient not taking: Reported on 10/14/2015) 30 tablet 0   Facility-Administered Medications Ordered in Other Encounters  Medication Dose Route Frequency Provider Last Rate Last Dose  . 0.9 %  sodium chloride infusion   Intravenous Continuous Angelia Mould, MD      . Chlorhexidine Gluconate Cloth 2 % PADS 6 each  6 each Topical Once Angelia Mould, MD       Labs: Basic Metabolic Panel:  Recent Labs Lab 12/21/15 0640  NA 131*  K 7.1*  CL 91*  CO2 20*  GLUCOSE 267*  BUN 219*  CREATININE 12.37*  CALCIUM 9.1   Liver Function Tests:  Recent Labs Lab 12/21/15 0640  AST 16  ALT 14*  ALKPHOS 47  BILITOT 0.4  PROT 6.1*  ALBUMIN 2.9*    Recent Labs Lab 12/21/15 0640  LIPASE 81*   No results for input(s): AMMONIA in the last 168 hours. CBC:  Recent Labs Lab 12/21/15 0640  WBC 12.8*  NEUTROABS 9.8*  HGB 6.9*  HCT 21.5*  MCV 101.9*  PLT 259    Studies/Results: Dg Chest Port 1 View  Result Date: 12/21/2015 CLINICAL DATA:  LEFT chest and arm pain for 2 days. History of diabetes, renal failure, CHF. EXAM: PORTABLE CHEST 1 VIEW COMPARISON:  Chest radiograph March 09, 2015 FINDINGS: Cardiac silhouette is similarly enlarged. Mediastinal silhouette is nonsuspicious, tunneled dialysis catheter via RIGHT internal jugular venous approach with distal tips projecting cavoatrial junction. Strandy densities LEFT lung base. Pulmonary vasculature appears normal, no pleural effusion or focal consolidation. No pneumothorax. Large body habitus. Osseous structures are nonsuspicious. IMPRESSION: Similar cardiomegaly, LEFT lung base atelectasis. Electronically Signed   By: Elon Alas M.D.   On: 12/21/2015 06:49    ROS: As per HPI otherwise negative.   Physical Exam: Vitals:   12/21/15 0608 12/21/15 0615 12/21/15 0745 12/21/15 0857  BP: 146/62 132/62 (!) 124/33 (!) 121/37  Pulse: 98 95 95 97  Resp: 20 16 17 17   Temp: 97.8 F (36.6 C)   97.3 F (36.3 C)   TempSrc: Oral   Oral  SpO2: 97% 100% 100% 100%  Weight: 127.5 kg (281 lb)     Height: 5\' 8"  (1.727 m)        General: Well developed, anxious, in moderate distress.  Head: Normocephalic, atraumatic, sclera non-icteric, mucus membranes are moist Neck: Supple. JVD not elevated.  Lungs: Bilateral breath sounds, decreased in bases, RR rapid/shallow, CTA.  Heart: RRR with S1 S2. No murmurs, rubs, or gallops appreciated. Abdomen: Soft, non-tender, non-distended with normoactive bowel sounds. No rebound/guarding. No obvious abdominal masses. M-S:  Strength and tone appear normal for age. Lower extremities:without edema or ischemic changes, no open wounds  Neuro: Alert and oriented X 3. Moves all extremities spontaneously. Psych:  Responds to questions appropriately with a normal affect. Dialysis Access: RIJ TDC. Maturing LUA AVF no bruit in upper portion  Dialysis Orders: [facility] MWF 4 hours 15 minutes 250/800 EDW 127 2.0 K/2.25 Ca UF profile 4 Maturing LUA AVF/RIJ TDC   Assessment/Plan: 1. Low HGB/Possible GI Bleed: HGB 6.9. 2 units of PRBCs started in HD. FOBT positive.  Per primary 2. Chest pain: Troponin 0.16 in ED, repeat troponin sent. EKG shows ST. Per primarmy 3. Hyperkalemia: K+ 7.1 on arrival to ED. Patient does not miss HD-did not eat yesterday. rec'd 30 minutes 0.0 K bath. 30 minutes 1.0 K bath. Repeat K+prior to termination of HD 5.1. Will recheck in next 2 hours.  4.  ESRD - MWF-[facility]. Compliant with HD, last tx 12/18/15. Rec'd 1 hour treatment. Will assess later for need to complete tx 5.  Hypotension/volume  -on midodrine for BP 10 mg PO TID. BP soft on HD-has not had dose of midodrine today. Rec'd 500cc NS and 1 units of  PRBCS. OP EDW 127 kg-left 0.2 above EDW last tx.  6.  Anemia  - As noted above. Last in-center HGB 12.9 Fe 92 TIBC 219 Tsat 42%.  12/16/15. No ESA/has been on weekly Fe 7.  Metabolic bone disease - Auryxia/Calcium acetate binders-resume when able to take POs.  Continue VDRA.  8.  Nutrition - NPO. Morbidly obese with low albumin 2.9.  9.  DM: Per primary.   Rita H. Owens Shark, NP-C 12/21/2015, 9:04 AM  [facility] [phone]   Renal Attending:  GI bleed with severe azotemia and hyperkalemia.  HD terminated early due to respiratory insufficiency.   Will follow K and repeat HD prn. Marcianna Daily C

## 2015-12-21 NOTE — Progress Notes (Signed)
Inpatient Diabetes Program Recommendations  AACE/ADA: New Consensus Statement on Inpatient Glycemic Control (2015)  Target Ranges:  Prepandial:   less than 140 mg/dL      Peak postprandial:   less than 180 mg/dL (1-2 hours)      Critically ill patients:  140 - 180 mg/dL  Results for RIPKEN, MONTERROSA (MRN [id number]) as of 12/21/2015 12:34  Ref. Range 12/21/2015 09:10 12/21/2015 11:16  Glucose-Capillary Latest Ref Range: 65 - 99 mg/dL 355 (H) 278 (H)    Review of Glycemic Control  Diabetes history: DM1 Outpatient Diabetes medications: Animas insulin pump with Humalog (followed by Dr. Starleen Blue with [facility]) Current orders for Inpatient glycemic control: Novolog 0-9 units Q4H  Inpatient Diabetes Program Recommendations: Insulin - Basal: Noted patient uses an insulin pump as an outpatient which has been removed. Please consider ordering Lantus 25 units Q24H (based on 127 kg x 0.2 units). Correction (SSI): Please consider using ICU Glycemic Control order set.  NOTE: In reviewing chart, noted in Care Everywhere tab that patient is noted to have Type 1 diabetes and is followed by Dr. Starleen Blue with [facility] and was last seen by Dr. Starleen Blue on 08/20/15. According to home medication list the following are patient's basal rates on his insulin pump:  2400 1.5 units/hr, 0300 1.9 units/hr, 0800 1.45 units/hr, 1200 1.625 units/hr. If these basal rates are correct, patient should be receiving a total of 39.3 units/24 hours for basal needs. Spoke with Leroy Sea, RN and he confirms that patient's insulin pump has been removed and that his wife has his insulin pump in hand at this time. Since patient's insulin pump has been removed, patient will need basal insulin SQ along with Novolog correction insulin. Leroy Sea, RN to discuss with MD.  Thanks, Barnie Alderman, RN, MSN, CDE Diabetes Coordinator Inpatient Diabetes Program [phone] (Team Pager from [place] to [place]) [phone] ([place] office) [phone] [facility]  office) [phone] [facility] office)

## 2015-12-21 NOTE — ED Notes (Signed)
Physician at bedside.

## 2015-12-21 NOTE — ED Provider Notes (Signed)
Blood pressure 132/62, pulse 95, temperature 97.8 F (36.6 C), temperature source Oral, resp. rate 16, height 5\' 8"  (1.727 m), weight 281 lb (127.5 kg), SpO2 100 %.  Assuming care from Dr. Ralene Bathe.  In short, Dale [name] is a 50 y.o. male with a chief complaint of Arm Pain and Chest Pain .  Refer to the original H&P for additional details.  The current plan of care is to follow labs, speak with Vascular surgery regarding decreased fistula pulse and pain. Patient with CP, resolved with fentanyl, and non-concerning EKG but low Hb (6.9). Plan to hold ASA and Heparin in the setting of low Hb. Patient not anticoagulated.   07:28 AM On my evaluation of the patient is awake and alert. We are establishing additional IV access. Patient with black, heme-positive stool and elevated troponin. Patient also with hyperkalemia (7.1) with normal EKG. No calcium at this time. Will discuss with GI and Nephrology regarding patient. Awaiting callback form Vascular surgery but have ordered doppler of the graft to be done at bedside. Will also discuss case with critical care. Patient does have a palpable pulse over the LUE fistula with faint thrill. No upper arm swelling or tightness to suggest bleeding from the site. Ordered 2 U PRBC to be transfused. Patient consented to receive blood.   CRITICAL CARE Performed by: Margette Fast Total critical care time: 60 minutes Critical care time was exclusive of separately billable procedures and treating other patients. Critical care was necessary to treat or prevent imminent or life-threatening deterioration. Critical care was time spent personally by me on the following activities: development of treatment plan with patient and/or surrogate as well as nursing, discussions with consultants, evaluation of patient's response to treatment, examination of patient, obtaining history from patient or surrogate, ordering and performing treatments and interventions, ordering and review of  laboratory studies, ordering and review of radiographic studies, pulse oximetry and re-evaluation of patient's condition.  07:48 AM Spoke with Dr. Hollie Salk with Nephrology who will assist with coordination of HD.   07:57 AM Spoke with Critical Care who will be down to see the patient and decide on ICU vs Stepdown. No hypotension at this time. Patient complaining of worsening left arm pain and return of some chest discomfort. Repeat EKG with no changes to suggest acute ischemia or arrhythmia.   EKG:   EKG Interpretation  Date/Time:  Monday December 21 2015 07:40:09 EDT Ventricular Rate:  97 PR Interval:    QRS Duration: 100 QT Interval:  347 QTC Calculation: 441 R Axis:   60 Text Interpretation:  Sinus rhythm Low voltage, precordial leads No STEMI.  Similar to prior tracing.  Confirmed by Carole Deere MD, Dera Vanaken [phone]) on 12/21/2015 7:59:47 AM      08:09 AM Spoke with GI who will be down for evaluation. Appreciate assistance.   08:53 AM Patient taken to HD without admission team. I was not aware that the patient was leaving the ED. The Nephrology team is seeing him there currently. I am told his blood will be taken there but critical care has not seen yet and he does not have admission orders. Called critical care and made them aware of patient location. He is going to see him now. Will keep in touch by phone.   09:17 AM  Critical care has seen the patient in HD. They will leave a consult note. Believe that the patient is stable for stepdown admission. Will page the hospitalist and ask that they see the patient  in HD as he has already started HD and cannot return to the ED immediately.   09:38 AM Discussed patient's case with hospitalist, Dr. Marily Memos.  Recommend admission to inpatient, stepdown bed.  I will place holding orders per their request. Patient and family (if present) updated with plan. Care transferred to hospitalist service.  I reviewed all nursing notes, vitals, pertinent old  records, EKGs, labs, imaging (as available).   Nanda Quinton, MD   Procedures      Margette Fast, MD 12/21/15 [phone]

## 2015-12-21 NOTE — ED Triage Notes (Signed)
Patient reports he began having L arm pain last night for 20-30 minutes. Pain stopped & patient went to bed. L arm pain restarted this morning approx 0500 along with central chest pain. 7/10 pain in L arm.  Patient is a dialysis patient - M, W, & F. Last dialysis on Friday.

## 2015-12-21 NOTE — Progress Notes (Signed)
[facility] MD contacted and made aware [facility]G [id number].

## 2015-12-21 NOTE — Procedures (Signed)
Intubation Procedure Note Vien Richburg [id number] 1965/05/09  Procedure: Intubation Indications: Respiratory insufficiency  Procedure Details Consent: Unable to obtain consent because of emergent medical necessity. Time Out: Verified patient identification, verified procedure, site/side was marked, verified correct patient position, special equipment/implants available, medications/allergies/relevent history reviewed, required imaging and test results available.  Performed  Maximum sterile technique was used including gloves, gown, hand hygiene and mask.  MAC and 3    Evaluation Hemodynamic Status: Transient hypotension treated with fluid; O2 sats: stable throughout Patient's Current Condition: unstable Complications: No apparent complications Patient did tolerate procedure well. Chest X-ray ordered to verify placement.  CXR: pending.   Raylene Miyamoto 12/21/2015  glide needed!!!  Lavon Paganini. Titus Mould, MD, [place] Pgr: [facility]

## 2015-12-21 NOTE — Progress Notes (Signed)
CRITICAL VALUE ALERT  Critical value received:  Troponin 3.47  Date of notification:  12/21/15  Time of notification:  [id number]  Critical value read back:Yes.    Nurse who received alert:  Allegra Grana, RN  MD notified (1st page):  Dr Lamonte Sakai  Time of first page:  1822  MD notified (2nd page):  Time of second page:  Responding MD:  Dr Lamonte Sakai  Time MD responded:  [phone]

## 2015-12-21 NOTE — Consult Note (Addendum)
PULMONARY / CRITICAL CARE MEDICINE   Name: Dale [name] MRN: [id number] DOB: 1965/06/28    ADMISSION DATE:  12/21/2015 CONSULTATION DATE:  12/21/15  REFERRING MD:  Florene Glen  CHIEF COMPLAINT:  Chest Pain  HISTORY OF PRESENT ILLNESS:  Pt is encephelopathic; therefore, this HPI is obtained from chart review. Dale [name] is a 50 y.o. male with PMH as outlined below including ESRD (M/W/F).  Per nephrology service, pt is extremely compliant with HD never missing any sessions.  On 8/27 he developed L arm pain which he initially thought was due to his "fistula not working".  Pain resolved spontaneously therefore he did feel that he needed to be evaluated.  The following morning 8/28, when he woke up to go to HD, pain was back and was worse.  He therefore went to ED for further evaluation.  In ED, he was found to have K 7, SCr 12.37, BUN 219, Hgb 6.9.  He was taken for urgent HD and after roughly 1 hour, he began to have diaphoresis, persistent L arm pain which radiated into chest, SOB, worsening anxiety.   PCCM was called and pt was emergently intubated while in HD unit.  He was transferred to ICU for further evaluation / management.   PAST MEDICAL HISTORY :  He  has a past medical history of Anemia of chronic disease; Arthritis; CAD (coronary artery disease); Cholecystitis; Chronic diastolic CHF (congestive heart failure) ([place]); Claustrophobia; Constipation; Constipation; Difficult intubation; ESRD (end stage renal disease) ([place]); Fibromyalgia; GERD (gastroesophageal reflux disease); Gout; History of blood transfusion; HLD (hyperlipidemia); Hypertension; Hypothyroidism; Inguinal lymphadenopathy; Insulin dependent diabetes mellitus ([place]); OSA (obstructive sleep apnea); Pericardial effusion; Pneumonia (~ 2007); and Shortness of breath dyspnea.  PAST SURGICAL HISTORY: He  has a past surgical history that includes Kidney transplant (04/25/1993); Cataract extraction w/ intraocular lens  implant,  bilateral (Bilateral, 1990's); Arteriovenous graft placement (Left, 1993?); Arteriovenous graft placement (Right, 1993?); Toe amputation (Bilateral, after 2005); Below knee leg amputation (Left, 2010); Carpal tunnel release (Bilateral, after 2005); Shoulder arthroscopy (Left, ~ 2006); AV fistula placement (07/29/2011); Insertion of dialysis catheter (08/01/2011); Lymph node biopsy (08/10/2011); Colonoscopy (08/29/2011); Appendectomy (~ 2006); Below knee leg amputation (Left); Umbilical hernia repair (1990's); Cardiac catheterization (03/19/2014); AV Graft PLACEMENT (Left, 1993?); Neuroplasty / transposition ulnar nerve at elbow (Left, after 2005); venogram (Right, 07/28/2011); left heart catheterization with coronary angiogram (N/A, 03/19/2014); Cardiac catheterization (03/19/2014); Coronary angioplasty with stent (04/09/2014    ); Angioplasty (04/09/2014); Femoral artery repair (04/09/2014); percutaneous coronary rotoblator intervention (pci-r) (N/A, 04/09/2014); Revison of arteriovenous fistula (Right, 11/20/2014); Cardiac catheterization (N/A, 12/25/2014); Carpal tunnel release (Bilateral); Eye surgery (Bilateral); Amputation of replicated toes (Right); Vitrectomy (Bilateral); Peritoneal catheter insertion; Peritoneal catheter removal; Arteriovenous graft placement (Left); Arteriovenous Graft Removed (Left); AV fistula placement (Left, 01/20/2015); Insertion of dialysis catheter (Right, 01/20/2015); AV fistula placement (Left, 04/30/2015); and Revison of arteriovenous fistula (Left, 09/01/2015).  Allergies  Allergen Reactions  . Contrast Media [Iodinated Diagnostic Agents] Rash and Itching    Systemic itching and transient rash appearance within hours of receiving contrast  Systemic itching and transient rash appearance within hours of receiving contrast  Redman Syndrome  . Iodides Rash  . Ioxaglate Rash  . Rocephin [Ceftriaxone] Itching  . Amoxicillin Itching and Rash    .Has patient had a PCN reaction causing  immediate rash, facial/tongue/throat swelling, SOB or lightheadedness with hypotension: Yes Has patient had a PCN reaction causing severe rash involving mucus membranes or skin necrosis: No Has patient had a PCN  reaction that required hospitalization No Has patient had a PCN reaction occurring within the last 10 years: No If all of the above answers are "NO", then may proceed with Cephalosporin use. Has patient had a PCN reaction causing immediate rash, facial/tongue/throat swelling, SOB or lightheadedness with hypotension: Yes Has patient had a PCN reaction causing severe rash involving mucus membranes or skin necrosis: No Has patient had a PCN reaction that required hospitalization No Has patient had a PCN reaction occurring within the last 10 years: No If all of the above answers are "NO", then may proceed with Cephalosporin use.  . Buprenorphine Hcl Nausea Only and Hives  . Clopidogrel Rash  . Enalapril Rash    cough  . Hydrocodone Rash  . Morphine Nausea Only, Rash and Hives  . Morphine And Related Hives and Nausea Only  . Adhesive [Tape] Other (See Comments) and Itching    Blisters on arm from adhesive tape at dialysis Blisters on arm from adhesive tape at dialysis  . Ciprofloxacin Itching and Rash  . Clindamycin/Lincomycin Hives and Itching  . Codeine Hives, Itching and Rash  . Doxycycline Rash  . Enalaprilat     Other reaction(s): Cough (ALLERGY/intolerance)  . Levofloxacin Hives and Itching  . Lincomycin Hcl Itching and Hives  . Mobic [Meloxicam] Hives and Rash  . Oxycodone Rash  . Plavix [Clopidogrel Bisulfate] Rash  . Vasotec Cough    Current Facility-Administered Medications on File Prior to Encounter  Medication  . 0.9 %  sodium chloride infusion  . Chlorhexidine Gluconate Cloth 2 % PADS 6 each   Current Outpatient Prescriptions on File Prior to Encounter  Medication Sig  . acetaminophen (TYLENOL) 500 MG tablet Take 1,000 mg by mouth every 6 (six) hours as needed  for moderate pain or headache.  . allopurinol (ZYLOPRIM) 100 MG tablet Take 200 mg by mouth daily.   [name] Kitchen aspirin EC 81 MG tablet Take 81 mg by mouth at bedtime.  [name] Kitchen atorvastatin (LIPITOR) 40 MG tablet Take 40 mg by mouth at bedtime.  . calcium acetate (PHOSLO) 667 MG capsule Take 1,334-2,001 mg by mouth See admin instructions. Take 3 capsules (2001 mg) by mouth with meals and 2 capsule (135m ) with snacks  . diphenhydrAMINE (BENADRYL) 25 mg capsule Take 1-2 capsules (25-50 mg total) by mouth every 6 (six) hours as needed for itching. (Patient taking differently: Take 50 mg by mouth 2 (two) times daily as needed for itching or sleep. )  . fenofibrate (TRICOR) 48 MG tablet Take 1 tablet (48 mg total) by mouth daily.  .[name] KitchenHYDROmorphone (DILAUDID) 2 MG tablet Take 1 tablet (2 mg total) by mouth 3 (three) times daily as needed for severe pain. (Patient taking differently: Take 2 mg by mouth 2 (two) times daily. Take 1 tablet (2 mg) by mouth daily at bedtime, may take an additional dose during the day as needed for severe pain)  . ibuprofen (ADVIL,MOTRIN) 200 MG tablet Take 400 mg by mouth 2 (two) times daily as needed for moderate pain.  . Insulin Human (INSULIN PUMP) 100 unit/ml SOLN Inject 1 each into the skin continuous. Humalog  . insulin lispro (HUMALOG) 100 UNIT/ML injection Inject 175 Units into the skin continuous. Up to 175 units continuous in pump,  Basal rates area as follows:  2400 1.5 units/hr, 0300 1.9 units/hr, 0800 1.45 units/hr, 1200 1.625 units/hr  . levothyroxine (SYNTHROID, LEVOTHROID) 100 MCG tablet Take 100 mcg by mouth at bedtime.   .[name] Kitchenloratadine (CLARITIN) 10 MG tablet  Take 10 mg by mouth daily.  . metoCLOPramide (REGLAN) 5 MG tablet Take 5 mg by mouth 4 (four) times daily - after meals and at bedtime.  . midodrine (PROAMATINE) 10 MG tablet Take 20 mg by mouth every Monday, Wednesday, and Friday. On dialysis days  . multivitamin (RENA-VIT) TABS tablet Take 1 tablet by mouth daily.  .  nitroGLYCERIN (NITROSTAT) 0.4 MG SL tablet Place 0.4 mg under the tongue every 5 (five) minutes as needed for chest pain.   . promethazine (PHENERGAN) 25 MG tablet Take 1 tablet (25 mg total) by mouth every 6 (six) hours as needed for nausea or vomiting. For nausea  . rOPINIRole (REQUIP) 2 MG tablet Take 2 mg by mouth daily with supper.   [name] Kitchen GLUCAGON EMERGENCY 1 MG injection Inject 1 mg into the muscle daily as needed (low blood sugar).   [name] Kitchen HYDROmorphone (DILAUDID) 2 MG tablet Take 1 tablet (2 mg total) by mouth every 4 (four) hours as needed for severe pain. (Patient not taking: Reported on 10/14/2015)    FAMILY HISTORY:  His indicated that his mother is alive. He indicated that his father is alive. He indicated that the status of his maternal aunt is unknown. He indicated that the status of his neg hx is unknown.    SOCIAL HISTORY: He  reports that he has never smoked. He has never used smokeless tobacco. He reports that he drinks alcohol. He reports that he does not use drugs.  REVIEW OF SYSTEMS:  Unable to obtain as pt is encephalopathic.  SUBJECTIVE:  On vent, agitated.  VITAL SIGNS: BP (!) 121/37 (BP Location: Right Arm)   Pulse 97   Temp 97.3 F (36.3 C) (Oral)   Resp 17   Ht '5\' 8"'  (1.727 m)   Wt 281 lb (127.5 kg)   SpO2 100%   BMI 42.73 kg/m   HEMODYNAMICS:    VENTILATOR SETTINGS:    INTAKE / OUTPUT: No intake/output data recorded.   PHYSICAL EXAMINATION: General: Adult male, critically ill. Neuro: Sedated, non-responsive. HEENT: [place]/AT. PERRL, sclerae anicteric. Cardiovascular: RRR, no M/R/G.  Lungs: Respirations even and unlabored.  CTA bilaterally, No W/R/R Abdomen: BS x 4, soft, NT/ND.  Musculoskeletal: L BKA, no edema.  Skin: Diaphoretic, warm, no rashes.  LABS:  BMET  Recent Labs Lab 12/21/15 0640  NA 131*  K 7.1*  CL 91*  CO2 20*  BUN 219*  CREATININE 12.37*  GLUCOSE 267*    Electrolytes  Recent Labs Lab 12/21/15 0640  CALCIUM 9.1     CBC  Recent Labs Lab 12/21/15 0640  WBC 12.8*  HGB 6.9*  HCT 21.5*  PLT 259    Coag's No results for input(s): APTT, INR in the last 168 hours.  Sepsis Markers No results for input(s): LATICACIDVEN, PROCALCITON, O2SATVEN in the last 168 hours.  ABG No results for input(s): PHART, PCO2ART, PO2ART in the last 168 hours.  Liver Enzymes  Recent Labs Lab 12/21/15 0640  AST 16  ALT 14*  ALKPHOS 47  BILITOT 0.4  ALBUMIN 2.9*    Cardiac Enzymes  Recent Labs Lab 12/21/15 0915  TROPONINI 0.26*    Glucose  Recent Labs Lab 12/21/15 0910  GLUCAP 355*    Imaging Dg Chest Port 1 View  Result Date: 12/21/2015 CLINICAL DATA:  LEFT chest and arm pain for 2 days. History of diabetes, renal failure, CHF. EXAM: PORTABLE CHEST 1 VIEW COMPARISON:  Chest radiograph March 09, 2015 FINDINGS: Cardiac silhouette is similarly enlarged. Mediastinal silhouette  is nonsuspicious, tunneled dialysis catheter via RIGHT internal jugular venous approach with distal tips projecting cavoatrial junction. Strandy densities LEFT lung base. Pulmonary vasculature appears normal, no pleural effusion or focal consolidation. No pneumothorax. Large body habitus. Osseous structures are nonsuspicious. IMPRESSION: Similar cardiomegaly, LEFT lung base atelectasis. Electronically Signed   By: Elon Alas M.D.   On: 12/21/2015 06:49     STUDIES:  CXR 8/28 > cardiomegaly.  CULTURES: None.  ANTIBIOTICS: None.  SIGNIFICANT EVENTS: 8/28 > admitted with hyperkalemia, probable GI bleed.  LINES/TUBES: ETT 8/28 >  DISCUSSION: 50 y.o. male with ESRD (M/W/F), admitted 8/28 with hyperkalemia, probable GI bleed.[name] Kitchen  Required emergent intubation while in HD suite.  ASSESSMENT / PLAN:  HEMATOLOGIC A:   Anemia - suspect hemorrhagic due to GI bleed.  Currently receiving 2u PRBC. VTE Prophylaxis. P:  PPI infusion. H/H q6hrs. May need GI consult. SCD's only. CBC in AM.  RENAL A:   ESRD  (M/W/F). Hyperkalemia - s/p emergent HD 8/28. Uremia - concern due to GI bleed. Hyponatremia. AGMA - uremia. P:   NS @ 100. BMP q2hrs. Assess lactate. Nephrology following, appreciate the assistance.  PULMONARY A: Acute hypoxic respiratory failure. OSA with probable OHS. P:   Full vent support. Wean as able. VAP prevention measures. SBT in AM if able. CXR in AM.  CARDIOVASCULAR A:  Hypotension - suspect hemorrhagic due to GI bleed along with component hypovolemia. Troponin leak - suspect demand ischemia due to GI bleed.  Will consider NSTEMI given chest pain however. Hx HTN, HLD, CAD, dCHF (Echo Nov 2016 with EF 60-65%, G2DD). P:  Levophed as needed to maintain MAP goal > 65. Trend troponins. Heparin restricted due to anemia with suspicion for GI bleed. Hold outpatient ASA, atorvastatin, fenofibrate, midodrine.  GASTROINTESTINAL A:   Concern for active GI bleed. Nutrition. Hx GERD. P:   PPI infusion. May need GI consult. NPO.  INFECTIOUS A:   No indication of infection. P:   Monitor clinically.  ENDOCRINE A:   DM. Hypothyroidism. P:   SSI. Continue outpatient synthroid, change to IV formulation. Assess TSH.  NEUROLOGIC A:   Acute encephalopathy. Hx fibromyalgia. P:   Sedation:  Fentanyl gtt / Midazolam PRN. RASS goal: 0 to -1. Daily WUA. Hold outpatient gabapentin, dilaudid, phenergan, ropinirole.  Family updated: Wife called by nephrology, she will come to hospital this afternoon.  Interdisciplinary Family Meeting v Palliative Care Meeting:  Due by: 12/27/15.  CC time: 40 minutes.   Montey Hora, [facility] Pager: [phone]  or [phone] 12/21/2015, 10:33 AM   STAFF NOTE: I, Merrie Roof, MD FACP have personally reviewed patient's available data, including medical history, events of note, physical examination and test results as part of my evaluation. I have discussed with  resident/NP and other care providers such as pharmacist, RN and RRT. In addition, I personally evaluated patient and elicited key findings of: seen multiple times in HD, initialy awake followed commands, then once HD started developed acute sob, tachy, change in MS, concern is GI bleed causing severe acute anemia with hypoperfusion, K was treated appropriately on HD for 30 min , required emergent ETT placement, assess cbc repeat stat, coags needed, does not take oral anticoagulation, arm pain likley from thrombosis of fistula>? Steal with anemia?, need to r/o anemia induced ischemia , cosnider echo, ppi drip, stat GI consult, NPO, OGT stat to suction, PRBC x 2 on going, vent to rate high  with presumed met acidosis, get lactic, get ABG stat, wife updated by renal NP, may need a line, use line hd stat emergent for now, seriel cbc post Tx, k repeat stat may need repeat hd session. D/w renal Levophed required to map 60 goal, get cortisol The patient is critically ill with multiple organ systems failure and requires high complexity decision making for assessment and support, frequent evaluation and titration of therapies, application of advanced monitoring technologies and extensive interpretation of multiple databases.   Critical Care Time devoted to patient care services described in this note is 90 Minutes. This time reflects time of care of this signee: Merrie Roof, MD FACP. This critical care time does not reflect procedure time, or teaching time or supervisory time of PA/NP/Med student/Med Resident etc but could involve care discussion time. Rest per NP/medical resident whose note is outlined above and that I agree with   Lavon Paganini. Titus Mould, MD, [place] Pgr: [facility] 12/21/2015 11:54 AM

## 2015-12-21 NOTE — Progress Notes (Signed)
*  PRELIMINARY RESULTS* Vascular Ultrasound Duplex Dialysis Access (AVF, AGV) has been completed.  Preliminary findings: Patent left brachiocephalic AVF. No focal areas of stenosis or branches noted. No perigraft fluid noted.   Landry Mellow, RDMS, RVT  12/21/2015, 8:27 AM

## 2015-12-21 NOTE — Progress Notes (Signed)
CRITICAL VALUE ALERT  Critical value received:  Potassium 7.1 Date of notification:  12/21/2015  Time of notification: [id number]  Critical value read back:Yes.    Nurse who received alert:  Rober Minion, RN  MD notified (1st page):  Titus Mould  Time of first page: 1346  MD notified (2nd page):  Time of second page:  Responding MD: Titus Mould Time MD responded:  [phone]

## 2015-12-21 NOTE — Consult Note (Signed)
[facility] Consult: 12:21 PM 12/21/2015  LOS: 0 days    Referring Provider: Dr Titus Mould.   Primary Care Physician:  [name], MD Primary Gastroenterologist:  Dr. Hilarie Fredrickson   Reason for Consultation:     HPI: Dale [name] is a 50 y.o. male.  From [place]. Many admissions over last 4 years, latest was 02/2015.  PMH ESRD, HD MWF.    Previous renal transplant 1995 failed after 18 years.  DM type 2, on insulin pump.  Anemia.  S/p BKA.  Diastolic CHF.  CAD, 2015 angioplasty and subsequent DES to RCA.  OSA.  Morbid obesity.  "Slow transit" constipation.  S/p several fistula surgeries on UE.  Hypothyroidism.  Salmonella bacteremia, enterocolitis in 2014.  Cholecystitis 08/2011.  O/A, chronic knee pain.   Fibromyalgia.   Blood transfusion in 06/2011.   Has received parenteral iron infusions in past (2013   08/2011 Colonoscopy for constipation, abd pain.  Dr Hilarie Fredrickson.  Left colon diverticulosis. Mucosa abnormal: pale, purple discoloration from cecum to sigmoid (path confirmed th clincal suspicion of ischemic injury related to low flow state).  Small  Int rrhoids.  2008 Colonoscopy   Left arm pain yesterday and again today. Taken to urgent inpt HD this AM and pain radiated to chest with additional SOB, diaphoresis, anxierty, ended up intubated in HD unit.  CG material in the NGT but has not been vomiting or c/o nausea at home.  Anorexia yesterday noted by wife.  Has longstanding c/o bloating and flatus and belching, treats with prn simethicone.   Stool always dark but formed, due to po iron use.      In ED Hgb 6.9.   Was 11.9 on 09/01/15.  MCV is high at 101.   FOBT is +.  NGT with CG effluent.  PRBCs x 2, a 3rd unit is ordered. .   Takes Ferric citrate, stool is always black. On weekly IV iron at HD center and no  Epo.    Troponins 0.16, 0.26 thus far.  So far cardiology not involved.      Past Medical History:  Diagnosis Date  . Anemia of chronic disease   . Arthritis    "hands, right knee" (03/19/2014)  . CAD (coronary artery disease)    a. Lexiscan Myoview (11/15):  Inf, apical cap and apical lateral ischemia, EF 56%, inf HK, Moderate Risk;  b. LHC (11/15):  mid to dist Dx 80%, mid RCA 99% (functional CTO), dist RCA 80% >> PCI: balloon angioplasty to mid RCA (could not deliver stent) - plan staged PCI with HSRA of RCA; Dx to be tx medically >> PCI: Rotoblator atherectomy/DES to [facility]  . Cholecystitis    a. 08/27/2011  . Chronic diastolic CHF (congestive heart failure) ([place])    a. Echo 3/13:  EF 55-60%;  b. Echo (11/15):  Mild LVH, EF 60-65%, no RWMA, Gr 1 DD, MAC, mild LAE, normal RVF, mild RAE;  c.  Echo 5/16:  severe LVH, EF 55-60%, no RWMA, Gr 1 DD, MAC, mild LAE  . Claustrophobia  when things get around his face.   . Constipation   . Constipation   . Difficult intubation    only once in 2015 at [facility] haven't had a problem since then  . ESRD (end stage renal disease) ([place])    a. 1995 s/p cadaveric transplant w/ susbequent failure after 18 yrs;  b.Dialysis initiated 07/2011 [facility] M-W-F  . Fibromyalgia   . GERD (gastroesophageal reflux disease)   . Gout    PMH  . History of blood transfusion   . HLD (hyperlipidemia)   . Hypertension   . Hypothyroidism   . Inguinal lymphadenopathy    a. bilateral - s/p biopsy 07/2011  . Insulin dependent diabetes mellitus (HCC)    Type I  . OSA (obstructive sleep apnea)    adjustable bed  . Pericardial effusion    a.  Small by CT 08/22/11;  b.  Large by CT 08/27/11  . Pneumonia ~ 2007  . Shortness of breath dyspnea    too much Fluid    Past Surgical History:  Procedure Laterality Date  . AMPUTATION OF REPLICATED TOES Right   . ANGIOPLASTY  04/09/2014   RCA   . APPENDECTOMY  ~ 2006  . ARTERIOVENOUS GRAFT PLACEMENT Left 1993?   forearm   . ARTERIOVENOUS GRAFT PLACEMENT Right 1993?   leg  . ARTERIOVENOUS GRAFT PLACEMENT Left    Thigh  . Arteriovenous Graft Removed Left    Thigh  . AV FISTULA PLACEMENT  07/29/2011   Procedure: ARTERIOVENOUS (AV) FISTULA CREATION;  Surgeon: Mal Misty, MD;  Location: [place];  Service: Vascular;  Laterality: Right;  Brachial cephalic  . AV FISTULA PLACEMENT Left 01/20/2015   Procedure: LIGATION OF RIGHT ARTERIOVENOUS FISTULA WITH EXCISION OF ANEURYSM;  Surgeon: Angelia Mould, MD;  Location: [place];  Service: Vascular;  Laterality: Left;  . AV FISTULA PLACEMENT Left 04/30/2015   Procedure: CREATION OF LEFT ARM BRACHIO-CEPHALIC ARTERIOVENOUS (AV) FISTULA ;  Surgeon: Angelia Mould, MD;  Location: [place];  Service: Vascular;  Laterality: Left;  . AV Graft PLACEMENT Left 1993?   "attempted one in my wrist; didn't take"  . BELOW KNEE LEG AMPUTATION Left 2010  . BELOW KNEE LEG AMPUTATION Left   . CARDIAC CATHETERIZATION  03/19/2014  . CARDIAC CATHETERIZATION  03/19/2014   Procedure: CORONARY BALLOON ANGIOPLASTY;  Surgeon: Burnell Blanks, MD;  Location: [facility] CATH LAB;  Service: Cardiovascular;;  . CARPAL TUNNEL RELEASE Bilateral after 2005  . CARPAL TUNNEL RELEASE Bilateral   . CATARACT EXTRACTION W/ INTRAOCULAR LENS  IMPLANT, BILATERAL Bilateral 1990's  . COLONOSCOPY  08/29/2011   Procedure: COLONOSCOPY;  Surgeon: Jerene Bears, MD;  Location: [place];  Service: Gastroenterology;  Laterality: N/A;  . CORONARY ANGIOPLASTY WITH STENT PLACEMENT  04/09/2014       ptca/des mid lad   . EYE SURGERY Bilateral    cataract  . FEMORAL ARTERY REPAIR  04/09/2014   ANGIOSEAL  . INSERTION OF DIALYSIS CATHETER  08/01/2011   Procedure: INSERTION OF DIALYSIS CATHETER;  Surgeon: Rosetta Posner, MD;  Location: [place];  Service: Vascular;  Laterality: Right;  insertion of dialysis catheter on right internal jugular vein  . INSERTION OF DIALYSIS CATHETER Right 01/20/2015   Procedure: INSERTION OF  DIALYSIS CATHETER - RIGHT INTERNAL JUGULAR ;  Surgeon: Angelia Mould, MD;  Location: [place];  Service: Vascular;  Laterality: Right;  . KIDNEY TRANSPLANT  04/25/1993  . LEFT HEART CATHETERIZATION WITH CORONARY ANGIOGRAM N/A 03/19/2014  Procedure: LEFT HEART CATHETERIZATION WITH CORONARY ANGIOGRAM;  Surgeon: Burnell Blanks, MD;  Location: [facility] C[facility] LAB;  Service: Cardiovascular;  Laterality: N/A;  . LYMPH NODE BIOPSY  08/10/2011   Procedure: LYMPH NODE BIOPSY;  Surgeon: Merrie Roof, MD;  Location: [place];  Service: General;  Laterality: Left;  left groin lymph biopsy  . NEUROPLASTY / TRANSPOSITION ULNAR NERVE AT ELBOW Left after 2005  . PERCUTANEOUS CORONARY ROTOBLATOR INTERVENTION (PCI-R) N/A 04/09/2014   Procedure: PERCUTANEOUS CORONARY ROTOBLATOR INTERVENTION (PCI-R);  Surgeon: Burnell Blanks, MD;  Location: [facility] CATH LAB;  Service: Cardiovascular;  Laterality: N/A;  . PERIPHERAL VASCULAR CATHETERIZATION N/A 12/25/2014   Procedure: Upper Extremity Venography;  Surgeon: Conrad Westside, MD;  Location: [place] CV LAB;  Service: Cardiovascular;  Laterality: N/A;  . PERITONEAL CATHETER INSERTION    . PERITONEAL CATHETER REMOVAL    . REVISON OF ARTERIOVENOUS FISTULA Right [id number]   Procedure: PLICATION / REVISION OF ARTERIOVENOUS FISTULA;  Surgeon: Angelia Mould, MD;  Location: [place];  Service: Vascular;  Laterality: Right;  . REVISON OF ARTERIOVENOUS FISTULA Left 09/01/2015   Procedure: SUPERFICIALIZATION OF LEFT ARM BRACHIOCEPHALIC ARTERIOVENOUS FISTULA;  Surgeon: Angelia Mould, MD;  Location: [place];  Service: Vascular;  Laterality: Left;  . SHOULDER ARTHROSCOPY Left ~ 2006   frozen  . TOE AMPUTATION Bilateral after 2005  . UMBILICAL HERNIA REPAIR  1990's  . VENOGRAM Right 07/28/2011   Procedure: VENOGRAM;  Surgeon: Conrad [place], MD;  Location: [facility] CATH LAB;  Service: Cardiovascular;  Laterality: Right;  . VITRECTOMY Bilateral     Prior to Admission  medications   Medication Sig Start Date End Date Taking? Authorizing Provider  acetaminophen (TYLENOL) 500 MG tablet Take 1,000 mg by mouth every 6 (six) hours as needed for moderate pain or headache.   Yes Historical Provider, MD  allopurinol (ZYLOPRIM) 100 MG tablet Take 200 mg by mouth daily.  03/14/12  Yes Historical Provider, MD  aspirin EC 81 MG tablet Take 81 mg by mouth at bedtime.   Yes Historical Provider, MD  atorvastatin (LIPITOR) 40 MG tablet Take 40 mg by mouth at bedtime. 02/13/15  Yes Historical Provider, MD  calcium acetate (PHOSLO) 667 MG capsule Take 1,334-2,001 mg by mouth See admin instructions. Take 3 capsules (2001 mg) by mouth with meals and 2 capsule (1334mg  ) with snacks 09/19/12  Yes Historical Provider, MD  diphenhydrAMINE (BENADRYL) 25 mg capsule Take 1-2 capsules (25-50 mg total) by mouth every 6 (six) hours as needed for itching. Patient taking differently: Take 50 mg by mouth 2 (two) times daily as needed for itching or sleep.  04/11/14  Yes Rhonda G Barrett, PA-C  fenofibrate (TRICOR) 48 MG tablet Take 1 tablet (48 mg total) by mouth daily. 02/27/15  Yes Shanker Kristeen Mans, MD  Ferric Citrate (AURYXIA) 1 GM 210 MG(Fe) TABS Take 2 g by mouth 3 (three) times daily with meals.   Yes Historical Provider, MD  gabapentin (NEURONTIN) 300 MG capsule Take 300 mg by mouth 3 (three) times daily.   Yes Historical Provider, MD  HYDROmorphone (DILAUDID) 2 MG tablet Take 1 tablet (2 mg total) by mouth 3 (three) times daily as needed for severe pain. Patient taking differently: Take 2 mg by mouth 2 (two) times daily. Take 1 tablet (2 mg) by mouth daily at bedtime, may take an additional dose during the day as needed for severe pain 11/20/14  Yes Alvia Grove, PA-C  ibuprofen (ADVIL,MOTRIN) 200 MG  tablet Take 400 mg by mouth 2 (two) times daily as needed for moderate pain.   Yes Historical Provider, MD  Insulin Human (INSULIN PUMP) 100 unit/ml SOLN Inject 1 each into the skin  continuous. Humalog   Yes Historical Provider, MD  insulin lispro (HUMALOG) 100 UNIT/ML injection Inject 175 Units into the skin continuous. Up to 175 units continuous in pump,  Basal rates area as follows:  2400 1.5 units/hr, 0300 1.9 units/hr, 0800 1.45 units/hr, 1200 1.625 units/hr   Yes Historical Provider, MD  levothyroxine (SYNTHROID, LEVOTHROID) 100 MCG tablet Take 100 mcg by mouth at bedtime.    Yes Historical Provider, MD  loratadine (CLARITIN) 10 MG tablet Take 10 mg by mouth daily.   Yes Historical Provider, MD  loratadine-pseudoephedrine (CLARITIN-D 12-HOUR) 5-120 MG tablet Take 1 tablet by mouth 2 (two) times daily.   Yes Historical Provider, MD  metoCLOPramide (REGLAN) 5 MG tablet Take 5 mg by mouth 4 (four) times daily - after meals and at bedtime. 05/26/11  Yes Historical Provider, MD  midodrine (PROAMATINE) 10 MG tablet Take 20 mg by mouth every Monday, Wednesday, and Friday. On dialysis days   Yes Historical Provider, MD  multivitamin (RENA-VIT) TABS tablet Take 1 tablet by mouth daily.   Yes Historical Provider, MD  nitroGLYCERIN (NITROSTAT) 0.4 MG SL tablet Place 0.4 mg under the tongue every 5 (five) minutes as needed for chest pain.    Yes [name], MD  omeprazole (PRILOSEC) 20 MG capsule Take 20 mg by mouth daily.   Yes Historical Provider, MD  promethazine (PHENERGAN) 25 MG tablet Take 1 tablet (25 mg total) by mouth every 6 (six) hours as needed for nausea or vomiting. For nausea 11/27/14  Yes Liliane Shi, PA-C  rOPINIRole (REQUIP) 2 MG tablet Take 2 mg by mouth daily with supper.    Yes Historical Provider, MD  GLUCAGON EMERGENCY 1 MG injection Inject 1 mg into the muscle daily as needed (low blood sugar).  01/19/12   Historical Provider, MD  HYDROmorphone (DILAUDID) 2 MG tablet Take 1 tablet (2 mg total) by mouth every 4 (four) hours as needed for severe pain. Patient not taking: Reported on 10/14/2015 09/01/15   Angelia Mould, MD    Scheduled Meds: . sodium  chloride   Intravenous Once  . fentaNYL      . fentaNYL (SUBLIMAZE) injection  50 mcg Intravenous Once  . insulin aspart  0-9 Units Subcutaneous Q4H  . levothyroxine  50 mcg Intravenous Daily  . [START ON 12/24/2015] pantoprazole  40 mg Intravenous Q12H   Infusions: . sodium chloride 100 mL/hr at 12/21/15 1200  . fentaNYL infusion INTRAVENOUS 200 mcg/hr (12/21/15 1200)  . norepinephrine (LEVOPHED) Adult infusion 10 mcg/min (12/21/15 1200)  . pantoprozole (PROTONIX) infusion 8 mg/hr (12/21/15 1202)   PRN Meds: sodium chloride, fentaNYL, midazolam, midazolam   Allergies as of 12/21/2015 - Review Complete 12/21/2015  Allergen Reaction Noted  . Contrast media [iodinated diagnostic agents] Rash and Itching 11/04/2013  . Iodides Rash 08/27/2015  . Ioxaglate Rash 08/27/2015  . Rocephin [ceftriaxone] Itching 09/24/2014  . Amoxicillin Itching and Rash 03/04/2011  . Buprenorphine hcl Nausea Only and Hives 08/27/2015  . Clopidogrel Rash 08/27/2015  . Enalapril Rash 08/27/2015  . Hydrocodone Rash 02/18/2014  . Morphine Nausea Only, Rash, and Hives 08/27/2015  . Morphine and related Hives and Nausea Only 08/22/2011  . Adhesive [tape] Other (See Comments) and Itching 03/01/2015  . Ciprofloxacin Itching and Rash 03/04/2011  . Clindamycin/lincomycin  Hives and Itching 03/04/2011  . Codeine Hives, Itching, and Rash 07/22/2011  . Doxycycline Rash 02/18/2014  . Enalaprilat  08/27/2015  . Levofloxacin Hives and Itching 07/22/2011  . Lincomycin hcl Itching and Hives 08/27/2015  . Mobic [meloxicam] Hives and Rash 08/08/2015  . Oxycodone Rash 11/21/2011  . Plavix [clopidogrel bisulfate] Rash 04/08/2014  . Vasotec Cough 03/18/2011    Family History  Problem Relation Age of Onset  . Colon polyps Father   . Diabetes Father   . Hypertension Father   . Hypertension Mother   . Diabetes Mother   . Hyperlipidemia Mother   . Breast cancer Maternal Aunt   . Cancer Maternal Aunt     Breast and Bone   . Malignant hyperthermia Neg Hx   . Heart attack Neg Hx   . Stroke Neg Hx     Social History   Social History  . Marital status: Married    Spouse name: N/A  . Number of children: N/A  . Years of education: N/A   Occupational History  . Disabled    Social History Main Topics  . Smoking status: Never Smoker  . Smokeless tobacco: Never Used  . Alcohol use Yes  . Drug use: No  . Sexual activity: Not on file   Other Topics Concern  . Not on file   Social History Narrative   Lives @ home with his wife in [place].     REVIEW OF SYSTEMS: Constitutional:  No real change in energy level in recent days ENT:  No nose bleeds Pulm:  No cough, no new DOE at home CV:  No palpitations, no LE edema.  GU:  No hematuria, no frequency GI:  No dysphagia.  See HPI Heme:  See HPI   Transfusions:  See HPI Neuro:  No headaches, no peripheral tingling or numbness Derm:  No itching, no rash or sores.  Endocrine:  No sweats or chills.  No polyuria or dysuria Immunization:  Not queried.   Travel:  None beyond local counties in last few months.    PHYSICAL EXAM: Vital signs in last 24 hours: Vitals:   12/21/15 1130 12/21/15 1200  BP: (!) 109/34 (!) 125/33  Pulse: 100 (!) 104  Resp: (!) 26 (!) 26  Temp:     Wt Readings from Last 3 Encounters:  12/21/15 127.5 kg (281 lb)  10/14/15 127.5 kg (281 lb)  09/01/15 127.5 kg (281 lb)    General: morbidly obese, pale white male.  Intermittently aroused.  intubated Head:  No trauma or swelling.  No asymmetry  Eyes:  No pallor or icterus. Ears:  Not able to assess hearing, but not deaf.    Nose:  No discharge.  NGT with black fluid.   Mouth:  Clear, moist oral MM Neck:  No mass, no TMG Lungs:  Intubated on vent.  No labored breathing Heart: RRR.  Slight tachy in 1teens. Abdomen:  Obese and mildly tense.  NT.  BS active.  No mass, hernia, HSM appreciated. .   Rectal: deferred   Musc/Skeltl: no joint redness.   Extremities:  Tense,  anasarca in right LE, discolaration in lower right leg.  BKA on left.   Neurologic:  Follows some simple commands Skin:  No open sores.   Tattoos:  none Psych:  Calm on vent.   Intake/Output from previous day: No intake/output data recorded. Intake/Output this shift: Total I/O In: 252.1 [I.V.:252.1] Out: -1505   LAB RESULTS:  Recent Labs  12/21/15 0640  WBC 12.8*  HGB 6.9*  HCT 21.5*  PLT 259   BMET Lab Results  Component Value Date   NA 131 (L) 12/21/2015   NA 131 (L) 12/21/2015   NA 134 (L) 09/01/2015   K 6.0 (H) 12/21/2015   K 7.1 (HH) 12/21/2015   K 5.7 (H) 09/01/2015   CL 97 (L) 12/21/2015   CL 91 (L) 12/21/2015   CL 99 (L) 08/08/2015   CO2 17 (L) 12/21/2015   CO2 20 (L) 12/21/2015   CO2 22 08/08/2015   GLUCOSE 393 (H) 12/21/2015   GLUCOSE 267 (H) 12/21/2015   GLUCOSE 111 (H) 09/01/2015   BUN 160 (H) 12/21/2015   BUN 219 (H) 12/21/2015   BUN 29 (H) 08/08/2015   CREATININE 9.39 (H) 12/21/2015   CREATININE 12.37 (H) 12/21/2015   CREATININE 6.24 (H) 08/08/2015   CALCIUM 7.5 (L) 12/21/2015   CALCIUM 9.1 12/21/2015   CALCIUM 9.6 08/08/2015   LFT  Recent Labs  12/21/15 0640  PROT 6.1*  ALBUMIN 2.9*  AST 16  ALT 14*  ALKPHOS 47  BILITOT 0.4   PT/INR Lab Results  Component Value Date   INR 1.14 11/20/2014   INR 1.1 (H) 04/03/2014   INR 1.1 (H) 03/06/2014   Hepatitis Panel No results for input(s): HEPBSAG, HCVAB, HEPAIGM, HEPBIGM in the last 72 hours. C-Diff No components found for: CDIFF Lipase     Component Value Date/Time   LIPASE 81 (H) 12/21/2015 0640    Drugs of Abuse  No results found for: LABOPIA, COCAINSCRNUR, LABBENZ, AMPHETMU, THCU, LABBARB   RADIOLOGY STUDIES: Dg Chest Port 1 View  Result Date: 12/21/2015 CLINICAL DATA:  Intubated EXAM: PORTABLE CHEST 1 VIEW COMPARISON:  Chest radiograph from earlier today. FINDINGS: Endotracheal tube tip is 3.8 cm above the carina. Right internal jugular central venous catheter terminates  at the cavoatrial junction. Enteric tube enters stomach with the tip not seen on this image. Stable cardiomediastinal silhouette with cardiomegaly. No pneumothorax. Stable probable trace bilateral pleural effusions. Mild pulmonary edema, not definitely changed. Patchy left lung base opacity appears increased. IMPRESSION: 1. Well-positioned support structures as described. 2. Stable mild congestive heart failure . 3. Increased patchy left lung base opacity, favor atelectasis, cannot exclude a component of pneumonia or aspiration. Electronically Signed   By: Ilona Sorrel M.D.   On: 12/21/2015 11:26   Dg Chest Port 1 View  Result Date: 12/21/2015 CLINICAL DATA:  LEFT chest and arm pain for 2 days. History of diabetes, renal failure, CHF. EXAM: PORTABLE CHEST 1 VIEW COMPARISON:  Chest radiograph March 09, 2015 FINDINGS: Cardiac silhouette is similarly enlarged. Mediastinal silhouette is nonsuspicious, tunneled dialysis catheter via RIGHT internal jugular venous approach with distal tips projecting cavoatrial junction. Strandy densities LEFT lung base. Pulmonary vasculature appears normal, no pleural effusion or focal consolidation. No pneumothorax. Large body habitus. Osseous structures are nonsuspicious. IMPRESSION: Similar cardiomegaly, LEFT lung base atelectasis. Electronically Signed   By: Elon Alas M.D.   On: 12/21/2015 06:49    ENDOSCOPIC STUDIES: Per HPI  IMPRESSION:   *  Acute blood loss anemia,  FOBT +.  On oral iron and parenteral iron at HD for anemia of renal disease.  Using daily Advil and 81 ASA, rule out PUD. On weekly parenteral iron at HD.  Previously on monthly Mircera but not used since 10/19/15 as Hgb did not warrant     *  AMS, agitation and hypoxia, leading to intubation.    *  Elevated, rising Troponins.  ?  Demand ischemia.  Previous angioplasties and stent in 2015.   *  ESRD.  IDDM.  OSA.    *  Hyperkalemia.  Only received 1 hour on HD today.    *  Ischemic  colitis in 2013.  No issues with diarrhea, constipation in recent months.    *  Right knee pain.  Has date set for nerve ablation to manage pain.  Not a TKR candidate due to left BKA status.     PLAN:     *  EGD, tomorrow with MAC in endo unit or at bedside if still on vent.  Leave on IV PPI now.     Azucena Freed  12/21/2015, 12:21 PM Pager: [phone]

## 2015-12-21 NOTE — Progress Notes (Signed)
CRITICAL VALUE ALERT  Critical value received:  Lactic Acid 3.0  Date of notification: 12/21/2015   Time of notification:  [id number] Critical value read back:Yes.    Nurse who received alert:  Rober Minion, RN MD notified (1st page):  Titus Mould  Time of first page:  1214 MD notified (2nd page):  Time of second page:  Responding MD:  Titus Mould Time MD responded:  1230

## 2015-12-21 NOTE — ED Notes (Signed)
Vascular Tech at bedside.  

## 2015-12-21 NOTE — Significant Event (Signed)
Rapid Response Event Note  Overview:  Called to assist in HD with unstable patient  Time Called: 0939 Arrival Time: 0940 Event Type: Respiratory, Hypotension  Initial Focused Assessment: On arrival patient cold clammy restless and agitated on stretcher - pale - responds to voice but with confusion - endorses chest and left arm discomfort - PRBC infusing - remains on HD with no fluid pull - K was elevated on admission - abd large - some increased firmness left side - ST on monitor - no acute 12 lead findings - Juanell Fairly NP at bedside - patient admitted with low Hgb and high K thru ED this am - urgent HD.  Per staff Joy RN has become increasingly confused with difficult resps - insulin pump present - turned off by staff - CBG 345 - BP soft 85/54 HR 127 RR 32 unable to get O2 sats - patient extreme diaphoresis.   Interventions:  Stat call to Dr. Titus Mould who had consulted earlier when patient had no acute distress - stat call for ABG - NS bolus infusing.  Dr. Titus Mould to bedside - RT Katie attempting ABG without success  - patient continued to become less responsive - BP 79/50 HR 128 prepared for urgent intubation.  HD stopped - Dr. Florene Glen at bedside.  Fentanyl 100 mcg and Versed 2 mg IV given per order Dr. Titus Mould.  Intubated times one attempt by Dr. Titus Mould - see note - patient more alert - reaching for ETT - Etomidate 20 mg IV given per order. BP 90/31 HR 127.  See VS flowsheet. Second unit of PRBC infusing - NS infusing via HD cath. Transported to 2M14 after brief phone report to [employer].  In route patient reaching for ETT - 100 mcg Fentanyl given per order Dr. Titus Mould.  To 2M14 with RT bagging - tol well - handoff to [facility].  Wife contacted and updated by Juanell Fairly RN.  Plan of Care (if not transferred):  Event Summary: Name of Physician Notified: Dr. Florene Glen - Juanell Fairly NP at bedside speaking with Dr. Florene Glen at  (pta RRT)  Name of Consulting Physician Notified: Dr. Titus Mould at  [phone]  Outcome: Transferred (Comment)  Event End Time: 1035  Quin Hoop

## 2015-12-21 NOTE — Procedures (Signed)
Intubation Procedure Note Jerret Butorac [id number] 08-03-1965  Procedure: Intubation Indications: Respiratory insufficiency  Procedure Details Consent: Unable to obtain consent because of altered level of consciousness. Time Out: Verified patient identification, verified procedure, site/side was marked, verified correct patient position, special equipment/implants available, medications/allergies/relevent history reviewed, required imaging and test results available.  Performed  Maximum sterile technique was used including gloves, gown, hand hygiene and mask.  MAC and 3    Evaluation Hemodynamic Status: BP stable throughout; O2 sats: stable throughout Patient's Current Condition: stable Complications: No apparent complications Patient did tolerate procedure well. Chest X-ray ordered to verify placement.  CXR: pending.  Pt intubated using glidescope # 3 blade with 7.5 ett secured at 24 at the lip. Positive color change on etco2, direct visualization, bilateral BS, CXR pending. RT manually ventilating pt to 40M.    Jesse Sans 12/21/2015

## 2015-12-21 NOTE — Procedures (Signed)
Difficulty with hemodialysis with complaints of SOB, cp, anxiety, LUE discomfort, EKG not remarkable, ox sats 100% with supplementation.  UF turned off and treated for hyperkalemia.  Pt had declining respirations and was intubated by RRT/CCM Dale [name]

## 2015-12-21 NOTE — Progress Notes (Signed)
Lab called with critical K+ result of 6.5. Notified GI on call, per their request. Provided same with Dr. Janit Pagan pager number.

## 2015-12-21 NOTE — Procedures (Signed)
Central Venous Catheter Insertion Procedure Note Dale [name] [id number] Jul 23, 1965  Procedure: Insertion of Central Venous Catheter Indications: Assessment of intravascular volume, Drug and/or fluid administration and Frequent blood sampling  Procedure Details Consent: Risks of procedure as well as the alternatives and risks of each were explained to the (patient/caregiver).  Consent for procedure obtained. Time Out: Verified patient identification, verified procedure, site/side was marked, verified correct patient position, special equipment/implants available, medications/allergies/relevent history reviewed, required imaging and test results available.  Performed  Maximum sterile technique was used including antiseptics, cap, gloves, gown, hand hygiene, mask and sheet. Skin prep: Chlorhexidine; local anesthetic administered A antimicrobial bonded/coated triple lumen catheter was placed in the left internal jugular vein using the Seldinger technique.  Evaluation Blood flow good Complications: No apparent complications Patient did tolerate procedure well. Chest X-ray ordered to verify placement.  CXR: pending.  Raylene Miyamoto 12/21/2015, 2:55 PM  emergent need As need hd cath for cvvhd Shock US guidance , til poor images, art puncture x 1, no sig hematoma  Lavon Paganini. Titus Mould, MD, [place] Pgr: [facility]

## 2015-12-22 ENCOUNTER — Encounter (HOSPITAL_COMMUNITY): Payer: Self-pay | Admitting: Gastroenterology

## 2015-12-22 ENCOUNTER — Encounter (HOSPITAL_COMMUNITY)
Admission: EM | Disposition: A | Discharge status: Home Health | Payer: Self-pay | Source: Home / Self Care | Attending: Internal Medicine

## 2015-12-22 ENCOUNTER — Inpatient Hospital Stay (HOSPITAL_COMMUNITY): Payer: Medicare Other

## 2015-12-22 DIAGNOSIS — J9600 Acute respiratory failure, unspecified whether with hypoxia or hypercapnia: Secondary | ICD-10-CM

## 2015-12-22 DIAGNOSIS — N19 Unspecified kidney failure: Secondary | ICD-10-CM

## 2015-12-22 LAB — TYPE AND SCREEN
ABO/RH(D): A POS
Antibody Screen: NEGATIVE
Unit division: 0
Unit division: 0
Unit division: 0
Unit division: 0

## 2015-12-22 LAB — BLOOD GAS, ARTERIAL
Acid-base deficit: 4.4 mmol/L — ABNORMAL HIGH (ref 0.0–2.0)
Bicarbonate: 20.1 mmol/L (ref 20.0–28.0)
Drawn by: 460981
FIO2: 40
MECHVT: 600 mL
O2 Saturation: 98 %
PEEP: 5 cmH2O
Patient temperature: 98.6
RATE: 20 resp/min
TCO2: 21.2 mmol/L (ref 0–100)
pCO2 arterial: 36.6 mmHg (ref 32.0–48.0)
pH, Arterial: 7.359 (ref 7.350–7.450)
pO2, Arterial: 162 mmHg — ABNORMAL HIGH (ref 83.0–108.0)

## 2015-12-22 LAB — BASIC METABOLIC PANEL
Anion gap: 14 (ref 5–15)
BUN: 107 mg/dL — ABNORMAL HIGH (ref 6–20)
CO2: 22 mmol/L (ref 22–32)
Calcium: 7.7 mg/dL — ABNORMAL LOW (ref 8.9–10.3)
Chloride: 100 mmol/L — ABNORMAL LOW (ref 101–111)
Creatinine, Ser: 6.36 mg/dL — ABNORMAL HIGH (ref 0.61–1.24)
GFR calc Af Amer: 11 mL/min — ABNORMAL LOW (ref >60)
GFR calc non Af Amer: 9 mL/min — ABNORMAL LOW (ref >60)
Glucose, Bld: 182 mg/dL — ABNORMAL HIGH (ref 65–99)
Potassium: 3.9 mmol/L (ref 3.5–5.1)
Sodium: 136 mmol/L (ref 135–145)

## 2015-12-22 LAB — GLUCOSE, CAPILLARY
Glucose-Capillary: 125 mg/dL — ABNORMAL HIGH (ref 65–99)
Glucose-Capillary: 126 mg/dL — ABNORMAL HIGH (ref 65–99)
Glucose-Capillary: 127 mg/dL — ABNORMAL HIGH (ref 65–99)
Glucose-Capillary: 136 mg/dL — ABNORMAL HIGH (ref 65–99)
Glucose-Capillary: 138 mg/dL — ABNORMAL HIGH (ref 65–99)
Glucose-Capillary: 140 mg/dL — ABNORMAL HIGH (ref 65–99)
Glucose-Capillary: 144 mg/dL — ABNORMAL HIGH (ref 65–99)
Glucose-Capillary: 148 mg/dL — ABNORMAL HIGH (ref 65–99)
Glucose-Capillary: 174 mg/dL — ABNORMAL HIGH (ref 65–99)
Glucose-Capillary: 194 mg/dL — ABNORMAL HIGH (ref 65–99)
Glucose-Capillary: 195 mg/dL — ABNORMAL HIGH (ref 65–99)
Glucose-Capillary: 200 mg/dL — ABNORMAL HIGH (ref 65–99)
Glucose-Capillary: 207 mg/dL — ABNORMAL HIGH (ref 65–99)
Glucose-Capillary: 216 mg/dL — ABNORMAL HIGH (ref 65–99)
Glucose-Capillary: 219 mg/dL — ABNORMAL HIGH (ref 65–99)
Glucose-Capillary: 239 mg/dL — ABNORMAL HIGH (ref 65–99)
Glucose-Capillary: 240 mg/dL — ABNORMAL HIGH (ref 65–99)

## 2015-12-22 LAB — POCT I-STAT, CHEM 8
BUN: >140 mg/dL — ABNORMAL HIGH (ref 6–20)
Calcium, Ion: 1.14 mmol/L — ABNORMAL LOW (ref 1.15–1.40)
Chloride: 96 mmol/L — ABNORMAL LOW (ref 101–111)
Creatinine, Ser: 6.9 mg/dL — ABNORMAL HIGH (ref 0.61–1.24)
Glucose, Bld: 299 mg/dL — ABNORMAL HIGH (ref 65–99)
HCT: 28 % — ABNORMAL LOW (ref 39.0–52.0)
Hemoglobin: 9.5 g/dL — ABNORMAL LOW (ref 13.0–17.0)
Potassium: 5.1 mmol/L (ref 3.5–5.1)
Sodium: 136 mmol/L (ref 135–145)
TCO2: 20 mmol/L (ref 0–100)

## 2015-12-22 LAB — RENAL FUNCTION PANEL
Albumin: 2.5 g/dL — ABNORMAL LOW (ref 3.5–5.0)
Anion gap: 12 (ref 5–15)
BUN: 75 mg/dL — ABNORMAL HIGH (ref 6–20)
CO2: 20 mmol/L — ABNORMAL LOW (ref 22–32)
Calcium: 7.8 mg/dL — ABNORMAL LOW (ref 8.9–10.3)
Chloride: 102 mmol/L (ref 101–111)
Creatinine, Ser: 5.21 mg/dL — ABNORMAL HIGH (ref 0.61–1.24)
GFR calc Af Amer: 14 mL/min — ABNORMAL LOW (ref >60)
GFR calc non Af Amer: 12 mL/min — ABNORMAL LOW (ref >60)
Glucose, Bld: 247 mg/dL — ABNORMAL HIGH (ref 65–99)
Phosphorus: 4.7 mg/dL — ABNORMAL HIGH (ref 2.5–4.6)
Potassium: 4.7 mmol/L (ref 3.5–5.1)
Sodium: 134 mmol/L — ABNORMAL LOW (ref 135–145)

## 2015-12-22 LAB — CBC
HCT: 27.2 % — ABNORMAL LOW (ref 39.0–52.0)
Hemoglobin: 9.2 g/dL — ABNORMAL LOW (ref 13.0–17.0)
MCH: 31.3 pg (ref 26.0–34.0)
MCHC: 33.8 g/dL (ref 30.0–36.0)
MCV: 92.5 fL (ref 78.0–100.0)
Platelets: 171 10*3/uL (ref 150–400)
RBC: 2.94 MIL/uL — ABNORMAL LOW (ref 4.22–5.81)
RDW: 17.5 % — ABNORMAL HIGH (ref 11.5–15.5)
WBC: 11.4 10*3/uL — ABNORMAL HIGH (ref 4.0–10.5)

## 2015-12-22 LAB — ALBUMIN: Albumin: 2.4 g/dL — ABNORMAL LOW (ref 3.5–5.0)

## 2015-12-22 LAB — PHOSPHORUS: Phosphorus: 4.4 mg/dL (ref 2.5–4.6)

## 2015-12-22 LAB — TROPONIN I: Troponin I: 18.04 ng/mL (ref <0.03)

## 2015-12-22 LAB — MAGNESIUM: Magnesium: 2.2 mg/dL (ref 1.7–2.4)

## 2015-12-22 SURGERY — EGD (ESOPHAGOGASTRODUODENOSCOPY)
Anesthesia: Moderate Sedation

## 2015-12-22 MED ORDER — PRISMASOL BGK 4/2.5 32-4-2.5 MEQ/L IV SOLN
INTRAVENOUS | Status: DC
  Administered 2015-12-22: 04:00:00 via INTRAVENOUS_CENTRAL
  Filled 2015-12-22 (×8): qty 5000

## 2015-12-22 MED ORDER — PRISMASOL BGK 4/2.5 32-4-2.5 MEQ/L IV SOLN
INTRAVENOUS | Status: DC
  Administered 2015-12-22 (×5): via INTRAVENOUS_CENTRAL
  Filled 2015-12-22 (×48): qty 5000

## 2015-12-22 MED ORDER — HYDROCORTISONE NA SUCCINATE PF 100 MG IJ SOLR
50.0000 mg | Freq: Four times a day (QID) | INTRAMUSCULAR | Status: DC
  Administered 2015-12-22 – 2015-12-24 (×8): 50 mg via INTRAVENOUS
  Filled 2015-12-22 (×3): qty 1
  Filled 2015-12-22: qty 2
  Filled 2015-12-22 (×5): qty 1

## 2015-12-22 MED ORDER — INSULIN ASPART 100 UNIT/ML ~~LOC~~ SOLN
1.0000 [IU] | SUBCUTANEOUS | Status: DC
  Administered 2015-12-22 (×2): 3 [IU] via SUBCUTANEOUS

## 2015-12-22 MED ORDER — VASOPRESSIN 20 UNIT/ML IV SOLN
0.0300 [IU]/min | INTRAVENOUS | Status: DC
  Administered 2015-12-22 – 2015-12-23 (×2): 0.03 [IU]/min via INTRAVENOUS
  Filled 2015-12-22 (×2): qty 2

## 2015-12-22 MED ORDER — SODIUM CHLORIDE 0.9 % IV SOLN
INTRAVENOUS | Status: DC
  Administered 2015-12-22: 1.8 [IU]/h via INTRAVENOUS
  Filled 2015-12-22: qty 2.5

## 2015-12-22 MED ORDER — DEXTROSE 10 % IV SOLN: INTRAVENOUS | Status: DC | PRN

## 2015-12-22 MED ORDER — INSULIN GLARGINE 100 UNIT/ML ~~LOC~~ SOLN
10.0000 [IU] | SUBCUTANEOUS | Status: DC
  Administered 2015-12-22: 10 [IU] via SUBCUTANEOUS
  Filled 2015-12-22 (×2): qty 0.1

## 2015-12-22 NOTE — Progress Notes (Signed)
Inpatient Diabetes Program Recommendations  AACE/ADA: New Consensus Statement on Inpatient Glycemic Control (2015)  Target Ranges:  Prepandial:   less than 140 mg/dL      Peak postprandial:   less than 180 mg/dL (1-2 hours)      Critically ill patients:  140 - 180 mg/dL   Lab Results  Component Value Date   GLUCAP 240 (H) 12/22/2015   HGBA1C 6.6 (H) 02/25/2015   Diabetes history: DM1 Outpatient Diabetes medications: Animas insulin pump with Humalog (followed by Dr. Starleen Blue with [facility]) Per Diabetes Coordinator note from 12/21/15:  NOTE: In reviewing chart, noted in Care Everywhere tab that patient is noted to have Type 1 diabetes and is followed by Dr. Starleen Blue with [facility] and was last seen by Dr. Starleen Blue on 08/20/15. According to home medication list the following are patient's basal rates on his insulin pump:  2400 1.5 units/hr, 0300 1.9 units/hr, 0800 1.45 units/hr, 1200 1.625 units/hr. If these basal rates are correct, patient should be receiving a total of 39.3 units/24 hours for basal needs  Inpatient Diabetes Program Recommendations:   Agree with restart of insulin drip per ICU glycemic control order set.  May consider continuation of insulin drip while patient in on ventilator in order to control blood sugar levels.  Will follow.  Thanks, Adah Perl, RN, BC-ADM Inpatient Diabetes Coordinator Pager [phone] (8a-5p)

## 2015-12-22 NOTE — H&P (View-Only) (Signed)
[facility] Consult: 12:21 PM 12/21/2015  LOS: 0 days    Referring Provider: Dr Titus Mould.   Primary Care Physician:  [name], MD Primary Gastroenterologist:  Dr. Hilarie Fredrickson   Reason for Consultation:     HPI: Dale [name] is a 50 y.o. male.  From [place]. Many admissions over last 4 years, latest was 02/2015.  PMH ESRD, HD MWF.    Previous renal transplant 1995 failed after 18 years.  DM type 2, on insulin pump.  Anemia.  S/p BKA.  Diastolic CHF.  CAD, 2015 angioplasty and subsequent DES to RCA.  OSA.  Morbid obesity.  "Slow transit" constipation.  S/p several fistula surgeries on UE.  Hypothyroidism.  Salmonella bacteremia, enterocolitis in 2014.  Cholecystitis 08/2011.  O/A, chronic knee pain.   Fibromyalgia.   Blood transfusion in 06/2011.   Has received parenteral iron infusions in past (2013   08/2011 Colonoscopy for constipation, abd pain.  Dr Hilarie Fredrickson.  Left colon diverticulosis. Mucosa abnormal: pale, purple discoloration from cecum to sigmoid (path confirmed th clincal suspicion of ischemic injury related to low flow state).  Small  Int rrhoids.  2008 Colonoscopy   Left arm pain yesterday and again today. Taken to urgent inpt HD this AM and pain radiated to chest with additional SOB, diaphoresis, anxierty, ended up intubated in HD unit.  CG material in the NGT but has not been vomiting or c/o nausea at home.  Anorexia yesterday noted by wife.  Has longstanding c/o bloating and flatus and belching, treats with prn simethicone.   Stool always dark but formed, due to po iron use.      In ED Hgb 6.9.   Was 11.9 on 09/01/15.  MCV is high at 101.   FOBT is +.  NGT with CG effluent.  PRBCs x 2, a 3rd unit is ordered. .   Takes Ferric citrate, stool is always black. On weekly IV iron at HD center and no  Epo.    Troponins 0.16, 0.26 thus far.  So far cardiology not involved.      Past Medical History:  Diagnosis Date  . Anemia of chronic disease   . Arthritis    "hands, right knee" (03/19/2014)  . CAD (coronary artery disease)    a. Lexiscan Myoview (11/15):  Inf, apical cap and apical lateral ischemia, EF 56%, inf HK, Moderate Risk;  b. LHC (11/15):  mid to dist Dx 80%, mid RCA 99% (functional CTO), dist RCA 80% >> PCI: balloon angioplasty to mid RCA (could not deliver stent) - plan staged PCI with HSRA of RCA; Dx to be tx medically >> PCI: Rotoblator atherectomy/DES to [facility]  . Cholecystitis    a. 08/27/2011  . Chronic diastolic CHF (congestive heart failure) ([place])    a. Echo 3/13:  EF 55-60%;  b. Echo (11/15):  Mild LVH, EF 60-65%, no RWMA, Gr 1 DD, MAC, mild LAE, normal RVF, mild RAE;  c.  Echo 5/16:  severe LVH, EF 55-60%, no RWMA, Gr 1 DD, MAC, mild LAE  . Claustrophobia  when things get around his face.   . Constipation   . Constipation   . Difficult intubation    only once in 2015 at [facility] haven't had a problem since then  . ESRD (end stage renal disease) ([place])    a. 1995 s/p cadaveric transplant w/ susbequent failure after 18 yrs;  b.Dialysis initiated 07/2011 [facility] M-W-[id number]  . Fibromyalgia   . GERD (gastroesophageal reflux disease)   . Gout    PMH  . History of blood transfusion   . HLD (hyperlipidemia)   . Hypertension   . Hypothyroidism   . Inguinal lymphadenopathy    a. bilateral - s/p biopsy 07/2011  . Insulin dependent diabetes mellitus (HCC)    Type I  . OSA (obstructive sleep apnea)    adjustable bed  . Pericardial effusion    a.  Small by CT 08/22/11;  b.  Large by CT 08/27/11  . Pneumonia ~ 2007  . Shortness of breath dyspnea    too much Fluid    Past Surgical History:  Procedure Laterality Date  . AMPUTATION OF REPLICATED TOES Right   . ANGIOPLASTY  04/09/2014   RCA   . APPENDECTOMY  ~ 2006  . ARTERIOVENOUS GRAFT PLACEMENT Left 1993?   forearm   . ARTERIOVENOUS GRAFT PLACEMENT Right 1993?   leg  . ARTERIOVENOUS GRAFT PLACEMENT Left    Thigh  . Arteriovenous Graft Removed Left    Thigh  . AV FISTULA PLACEMENT  07/29/2011   Procedure: ARTERIOVENOUS (AV) FISTULA CREATION;  Surgeon: Mal Misty, MD;  Location: [place];  Service: Vascular;  Laterality: Right;  Brachial cephalic  . AV FISTULA PLACEMENT Left 01/20/2015   Procedure: LIGATION OF RIGHT ARTERIOVENOUS FISTULA WITH EXCISION OF ANEURYSM;  Surgeon: Angelia Mould, MD;  Location: [place];  Service: Vascular;  Laterality: Left;  . AV FISTULA PLACEMENT Left 04/30/2015   Procedure: CREATION OF LEFT ARM BRACHIO-CEPHALIC ARTERIOVENOUS (AV) FISTULA ;  Surgeon: Angelia Mould, MD;  Location: [place];  Service: Vascular;  Laterality: Left;  . AV Graft PLACEMENT Left 1993?   "attempted one in my wrist; didn't take"  . BELOW KNEE LEG AMPUTATION Left 2010  . BELOW KNEE LEG AMPUTATION Left   . CARDIAC CATHETERIZATION  03/19/2014  . CARDIAC CATHETERIZATION  03/19/2014   Procedure: CORONARY BALLOON ANGIOPLASTY;  Surgeon: Burnell Blanks, MD;  Location: [facility] CATH LAB;  Service: Cardiovascular;;  . CARPAL TUNNEL RELEASE Bilateral after 2005  . CARPAL TUNNEL RELEASE Bilateral   . CATARACT EXTRACTION W/ INTRAOCULAR LENS  IMPLANT, BILATERAL Bilateral 1990's  . COLONOSCOPY  08/29/2011   Procedure: COLONOSCOPY;  Surgeon: Jerene Bears, MD;  Location: [place];  Service: Gastroenterology;  Laterality: N/A;  . CORONARY ANGIOPLASTY WITH STENT PLACEMENT  04/09/2014       ptca/des mid lad   . EYE SURGERY Bilateral    cataract  . FEMORAL ARTERY REPAIR  04/09/2014   ANGIOSEAL  . INSERTION OF DIALYSIS CATHETER  08/01/2011   Procedure: INSERTION OF DIALYSIS CATHETER;  Surgeon: Rosetta Posner, MD;  Location: [place];  Service: Vascular;  Laterality: Right;  insertion of dialysis catheter on right internal jugular vein  . INSERTION OF DIALYSIS CATHETER Right 01/20/2015   Procedure: INSERTION OF  DIALYSIS CATHETER - RIGHT INTERNAL JUGULAR ;  Surgeon: Angelia Mould, MD;  Location: [place];  Service: Vascular;  Laterality: Right;  . KIDNEY TRANSPLANT  04/25/1993  . LEFT HEART CATHETERIZATION WITH CORONARY ANGIOGRAM N/A 03/19/2014  Procedure: LEFT HEART CATHETERIZATION WITH CORONARY ANGIOGRAM;  Surgeon: Burnell Blanks, MD;  Location: [facility] CATH LAB;  Service: Cardiovascular;  Laterality: N/A;  . LYMPH NODE BIOPSY  08/10/2011   Procedure: LYMPH NODE BIOPSY;  Surgeon: Merrie Roof, MD;  Location: [place];  Service: General;  Laterality: Left;  left groin lymph biopsy  . NEUROPLASTY / TRANSPOSITION ULNAR NERVE AT ELBOW Left after 2005  . PERCUTANEOUS CORONARY ROTOBLATOR INTERVENTION (PCI-R) N/A 04/09/2014   Procedure: PERCUTANEOUS CORONARY ROTOBLATOR INTERVENTION (PCI-R);  Surgeon: Burnell Blanks, MD;  Location: [facility] CATH LAB;  Service: Cardiovascular;  Laterality: N/A;  . PERIPHERAL VASCULAR CATHETERIZATION N/A 12/25/2014   Procedure: Upper Extremity Venography;  Surgeon: Conrad Yellowstone, MD;  Location: [place] CV LAB;  Service: Cardiovascular;  Laterality: N/A;  . PERITONEAL CATHETER INSERTION    . PERITONEAL CATHETER REMOVAL    . REVISON OF ARTERIOVENOUS FISTULA Right [id number]   Procedure: PLICATION / REVISION OF ARTERIOVENOUS FISTULA;  Surgeon: Angelia Mould, MD;  Location: [place];  Service: Vascular;  Laterality: Right;  . REVISON OF ARTERIOVENOUS FISTULA Left 09/01/2015   Procedure: SUPERFICIALIZATION OF LEFT ARM BRACHIOCEPHALIC ARTERIOVENOUS FISTULA;  Surgeon: Angelia Mould, MD;  Location: [place];  Service: Vascular;  Laterality: Left;  . SHOULDER ARTHROSCOPY Left ~ 2006   frozen  . TOE AMPUTATION Bilateral after 2005  . UMBILICAL HERNIA REPAIR  1990's  . VENOGRAM Right 07/28/2011   Procedure: VENOGRAM;  Surgeon: Conrad , MD;  Location: [facility] CATH LAB;  Service: Cardiovascular;  Laterality: Right;  . VITRECTOMY Bilateral     Prior to Admission  medications   Medication Sig Start Date End Date Taking? Authorizing Provider  acetaminophen (TYLENOL) 500 MG tablet Take 1,000 mg by mouth every 6 (six) hours as needed for moderate pain or headache.   Yes Historical Provider, MD  allopurinol (ZYLOPRIM) 100 MG tablet Take 200 mg by mouth daily.  03/14/12  Yes Historical Provider, MD  aspirin EC 81 MG tablet Take 81 mg by mouth at bedtime.   Yes Historical Provider, MD  atorvastatin (LIPITOR) 40 MG tablet Take 40 mg by mouth at bedtime. 02/13/15  Yes Historical Provider, MD  calcium acetate (PHOSLO) 667 MG capsule Take 1,334-2,001 mg by mouth See admin instructions. Take 3 capsules (2001 mg) by mouth with meals and 2 capsule (1334mg  ) with snacks 09/19/12  Yes Historical Provider, MD  diphenhydrAMINE (BENADRYL) 25 mg capsule Take 1-2 capsules (25-50 mg total) by mouth every 6 (six) hours as needed for itching. Patient taking differently: Take 50 mg by mouth 2 (two) times daily as needed for itching or sleep.  04/11/14  Yes Rhonda G Barrett, PA-C  fenofibrate (TRICOR) 48 MG tablet Take 1 tablet (48 mg total) by mouth daily. 02/27/15  Yes Shanker Kristeen Mans, MD  Ferric Citrate (AURYXIA) 1 GM 210 MG(Fe) TABS Take 2 g by mouth 3 (three) times daily with meals.   Yes Historical Provider, MD  gabapentin (NEURONTIN) 300 MG capsule Take 300 mg by mouth 3 (three) times daily.   Yes Historical Provider, MD  HYDROmorphone (DILAUDID) 2 MG tablet Take 1 tablet (2 mg total) by mouth 3 (three) times daily as needed for severe pain. Patient taking differently: Take 2 mg by mouth 2 (two) times daily. Take 1 tablet (2 mg) by mouth daily at bedtime, may take an additional dose during the day as needed for severe pain 11/20/14  Yes Alvia Grove, PA-C  ibuprofen (ADVIL,MOTRIN) 200 MG  tablet Take 400 mg by mouth 2 (two) times daily as needed for moderate pain.   Yes Historical Provider, MD  Insulin Human (INSULIN PUMP) 100 unit/ml SOLN Inject 1 each into the skin  continuous. Humalog   Yes Historical Provider, MD  insulin lispro (HUMALOG) 100 UNIT/ML injection Inject 175 Units into the skin continuous. Up to 175 units continuous in pump,  Basal rates area as follows:  2400 1.5 units/hr, 0300 1.9 units/hr, 0800 1.45 units/hr, 1200 1.625 units/hr   Yes Historical Provider, MD  levothyroxine (SYNTHROID, LEVOTHROID) 100 MCG tablet Take 100 mcg by mouth at bedtime.    Yes Historical Provider, MD  loratadine (CLARITIN) 10 MG tablet Take 10 mg by mouth daily.   Yes Historical Provider, MD  loratadine-pseudoephedrine (CLARITIN-D 12-HOUR) 5-120 MG tablet Take 1 tablet by mouth 2 (two) times daily.   Yes Historical Provider, MD  metoCLOPramide (REGLAN) 5 MG tablet Take 5 mg by mouth 4 (four) times daily - after meals and at bedtime. 05/26/11  Yes Historical Provider, MD  midodrine (PROAMATINE) 10 MG tablet Take 20 mg by mouth every Monday, Wednesday, and Friday. On dialysis days   Yes Historical Provider, MD  multivitamin (RENA-VIT) TABS tablet Take 1 tablet by mouth daily.   Yes Historical Provider, MD  nitroGLYCERIN (NITROSTAT) 0.4 MG SL tablet Place 0.4 mg under the tongue every 5 (five) minutes as needed for chest pain.    Yes [name], MD  omeprazole (PRILOSEC) 20 MG capsule Take 20 mg by mouth daily.   Yes Historical Provider, MD  promethazine (PHENERGAN) 25 MG tablet Take 1 tablet (25 mg total) by mouth every 6 (six) hours as needed for nausea or vomiting. For nausea 11/27/14  Yes Liliane Shi, PA-C  rOPINIRole (REQUIP) 2 MG tablet Take 2 mg by mouth daily with supper.    Yes Historical Provider, MD  GLUCAGON EMERGENCY 1 MG injection Inject 1 mg into the muscle daily as needed (low blood sugar).  01/19/12   Historical Provider, MD  HYDROmorphone (DILAUDID) 2 MG tablet Take 1 tablet (2 mg total) by mouth every 4 (four) hours as needed for severe pain. Patient not taking: Reported on 10/14/2015 09/01/15   Angelia Mould, MD    Scheduled Meds: . sodium  chloride   Intravenous Once  . fentaNYL      . fentaNYL (SUBLIMAZE) injection  50 mcg Intravenous Once  . insulin aspart  0-9 Units Subcutaneous Q4H  . levothyroxine  50 mcg Intravenous Daily  . [START ON 12/24/2015] pantoprazole  40 mg Intravenous Q12H   Infusions: . sodium chloride 100 mL/hr at 12/21/15 1200  . fentaNYL infusion INTRAVENOUS 200 mcg/hr (12/21/15 1200)  . norepinephrine (LEVOPHED) Adult infusion 10 mcg/min (12/21/15 1200)  . pantoprozole (PROTONIX) infusion 8 mg/hr (12/21/15 1202)   PRN Meds: sodium chloride, fentaNYL, midazolam, midazolam   Allergies as of 12/21/2015 - Review Complete 12/21/2015  Allergen Reaction Noted  . Contrast media [iodinated diagnostic agents] Rash and Itching 11/04/2013  . Iodides Rash 08/27/2015  . Ioxaglate Rash 08/27/2015  . Rocephin [ceftriaxone] Itching 09/24/2014  . Amoxicillin Itching and Rash 03/04/2011  . Buprenorphine hcl Nausea Only and Hives 08/27/2015  . Clopidogrel Rash 08/27/2015  . Enalapril Rash 08/27/2015  . Hydrocodone Rash 02/18/2014  . Morphine Nausea Only, Rash, and Hives 08/27/2015  . Morphine and related Hives and Nausea Only 08/22/2011  . Adhesive [tape] Other (See Comments) and Itching 03/01/2015  . Ciprofloxacin Itching and Rash 03/04/2011  . Clindamycin/lincomycin  Hives and Itching 03/04/2011  . Codeine Hives, Itching, and Rash 07/22/2011  . Doxycycline Rash 02/18/2014  . Enalaprilat  08/27/2015  . Levofloxacin Hives and Itching 07/22/2011  . Lincomycin hcl Itching and Hives 08/27/2015  . Mobic [meloxicam] Hives and Rash 08/08/2015  . Oxycodone Rash 11/21/2011  . Plavix [clopidogrel bisulfate] Rash 04/08/2014  . Vasotec Cough 03/18/2011    Family History  Problem Relation Age of Onset  . Colon polyps Father   . Diabetes Father   . Hypertension Father   . Hypertension Mother   . Diabetes Mother   . Hyperlipidemia Mother   . Breast cancer Maternal Aunt   . Cancer Maternal Aunt     Breast and Bone   . Malignant hyperthermia Neg Hx   . Heart attack Neg Hx   . Stroke Neg Hx     Social History   Social History  . Marital status: Married    Spouse name: N/A  . Number of children: N/A  . Years of education: N/A   Occupational History  . Disabled    Social History Main Topics  . Smoking status: Never Smoker  . Smokeless tobacco: Never Used  . Alcohol use Yes  . Drug use: No  . Sexual activity: Not on file   Other Topics Concern  . Not on file   Social History Narrative   Lives @ home with his wife in [place].     REVIEW OF SYSTEMS: Constitutional:  No real change in energy level in recent days ENT:  No nose bleeds Pulm:  No cough, no new DOE at home CV:  No palpitations, no LE edema.  GU:  No hematuria, no frequency GI:  No dysphagia.  See HPI Heme:  See HPI   Transfusions:  See HPI Neuro:  No headaches, no peripheral tingling or numbness Derm:  No itching, no rash or sores.  Endocrine:  No sweats or chills.  No polyuria or dysuria Immunization:  Not queried.   Travel:  None beyond local counties in last few months.    PHYSICAL EXAM: Vital signs in last 24 hours: Vitals:   12/21/15 1130 12/21/15 1200  BP: (!) 109/34 (!) 125/33  Pulse: 100 (!) 104  Resp: (!) 26 (!) 26  Temp:     Wt Readings from Last 3 Encounters:  12/21/15 127.5 kg (281 lb)  10/14/15 127.5 kg (281 lb)  09/01/15 127.5 kg (281 lb)    General: morbidly obese, pale white male.  Intermittently aroused.  intubated Head:  No trauma or swelling.  No asymmetry  Eyes:  No pallor or icterus. Ears:  Not able to assess hearing, but not deaf.    Nose:  No discharge.  NGT with black fluid.   Mouth:  Clear, moist oral MM Neck:  No mass, no TMG Lungs:  Intubated on vent.  No labored breathing Heart: RRR.  Slight tachy in 1teens. Abdomen:  Obese and mildly tense.  NT.  BS active.  No mass, hernia, HSM appreciated. .   Rectal: deferred   Musc/Skeltl: no joint redness.   Extremities:  Tense,  anasarca in right LE, discolaration in lower right leg.  BKA on left.   Neurologic:  Follows some simple commands Skin:  No open sores.   Tattoos:  none Psych:  Calm on vent.   Intake/Output from previous day: No intake/output data recorded. Intake/Output this shift: Total I/O In: 252.1 [I.V.:252.1] Out: -1505   LAB RESULTS:  Recent Labs  12/21/15 0640  WBC 12.8*  HGB 6.9*  HCT 21.5*  PLT 259   BMET Lab Results  Component Value Date   NA 131 (L) 12/21/2015   NA 131 (L) 12/21/2015   NA 134 (L) 09/01/2015   K 6.0 (H) 12/21/2015   K 7.1 (HH) 12/21/2015   K 5.7 (H) 09/01/2015   CL 97 (L) 12/21/2015   CL 91 (L) 12/21/2015   CL 99 (L) 08/08/2015   CO2 17 (L) 12/21/2015   CO2 20 (L) 12/21/2015   CO2 22 08/08/2015   GLUCOSE 393 (H) 12/21/2015   GLUCOSE 267 (H) 12/21/2015   GLUCOSE 111 (H) 09/01/2015   BUN 160 (H) 12/21/2015   BUN 219 (H) 12/21/2015   BUN 29 (H) 08/08/2015   CREATININE 9.39 (H) 12/21/2015   CREATININE 12.37 (H) 12/21/2015   CREATININE 6.24 (H) 08/08/2015   CALCIUM 7.5 (L) 12/21/2015   CALCIUM 9.1 12/21/2015   CALCIUM 9.6 08/08/2015   LFT  Recent Labs  12/21/15 0640  PROT 6.1*  ALBUMIN 2.9*  AST 16  ALT 14*  ALKPHOS 47  BILITOT 0.4   PT/INR Lab Results  Component Value Date   INR 1.14 11/20/2014   INR 1.1 (H) 04/03/2014   INR 1.1 (H) 03/06/2014   Hepatitis Panel No results for input(s): HEPBSAG, HCVAB, HEPAIGM, HEPBIGM in the last 72 hours. C-Diff No components found for: CDIFF Lipase     Component Value Date/Time   LIPASE 81 (H) 12/21/2015 0640    Drugs of Abuse  No results found for: LABOPIA, COCAINSCRNUR, LABBENZ, AMPHETMU, THCU, LABBARB   RADIOLOGY STUDIES: Dg Chest Port 1 View  Result Date: 12/21/2015 CLINICAL DATA:  Intubated EXAM: PORTABLE CHEST 1 VIEW COMPARISON:  Chest radiograph from earlier today. FINDINGS: Endotracheal tube tip is 3.8 cm above the carina. Right internal jugular central venous catheter terminates  at the cavoatrial junction. Enteric tube enters stomach with the tip not seen on this image. Stable cardiomediastinal silhouette with cardiomegaly. No pneumothorax. Stable probable trace bilateral pleural effusions. Mild pulmonary edema, not definitely changed. Patchy left lung base opacity appears increased. IMPRESSION: 1. Well-positioned support structures as described. 2. Stable mild congestive heart failure . 3. Increased patchy left lung base opacity, favor atelectasis, cannot exclude a component of pneumonia or aspiration. Electronically Signed   By: Ilona Sorrel M.D.   On: 12/21/2015 11:26   Dg Chest Port 1 View  Result Date: 12/21/2015 CLINICAL DATA:  LEFT chest and arm pain for 2 days. History of diabetes, renal failure, CHF. EXAM: PORTABLE CHEST 1 VIEW COMPARISON:  Chest radiograph March 09, 2015 FINDINGS: Cardiac silhouette is similarly enlarged. Mediastinal silhouette is nonsuspicious, tunneled dialysis catheter via RIGHT internal jugular venous approach with distal tips projecting cavoatrial junction. Strandy densities LEFT lung base. Pulmonary vasculature appears normal, no pleural effusion or focal consolidation. No pneumothorax. Large body habitus. Osseous structures are nonsuspicious. IMPRESSION: Similar cardiomegaly, LEFT lung base atelectasis. Electronically Signed   By: Elon Alas M.D.   On: 12/21/2015 06:49    ENDOSCOPIC STUDIES: Per HPI  IMPRESSION:   *  Acute blood loss anemia,  FOBT +.  On oral iron and parenteral iron at HD for anemia of renal disease.  Using daily Advil and 81 ASA, rule out PUD. On weekly parenteral iron at HD.  Previously on monthly Mircera but not used since 10/19/15 as Hgb did not warrant     *  AMS, agitation and hypoxia, leading to intubation.    *  Elevated, rising Troponins.  ?  Demand ischemia.  Previous angioplasties and stent in 2015.   *  ESRD.  IDDM.  OSA.    *  Hyperkalemia.  Only received 1 hour on HD today.    *  Ischemic  colitis in 2013.  No issues with diarrhea, constipation in recent months.    *  Right knee pain.  Has date set for nerve ablation to manage pain.  Not a TKR candidate due to left BKA status.     PLAN:     *  EGD, tomorrow with MAC in endo unit or at bedside if still on vent.  Leave on IV PPI now.     Azucena Freed  12/21/2015, 12:21 PM Pager: [phone]

## 2015-12-22 NOTE — Progress Notes (Signed)
Initial Nutrition Assessment  DOCUMENTATION CODES:   Morbid obesity  INTERVENTION:    Pending results of EGD, when able to start enteral nutrition, recommend Vital High Protein at goal rate of 55 ml/h (1320 ml) with Prostat 30 ml QID to provide 1720 kcal, 176 gm protein, 1104 ml free water daily.  NUTRITION DIAGNOSIS:   Inadequate oral intake related to inability to eat as evidenced by NPO status.  GOAL:   Provide needs based on ASPEN/SCCM guidelines  MONITOR:   Vent status, Weight trends, Labs, I & O's  REASON FOR ASSESSMENT:   Ventilator    ASSESSMENT:   Patient was admitted on 8/28 with hyperkalemia, probable GI bleed, near arrest. Hx of ESRD on HD. Required intubation.  Patient is currently intubated on ventilator support Temp (24hrs), Avg:97.9 F (36.6 C), Min:96.6 F (35.9 C), Max:98.5 F (36.9 C)  No plans for extubation today.  Patient is now requiring CRRT. EDW is 127 kg per Nephrology notes.  S/P EGD this AM. NGT in place with 250 ml bile appearing output since midnight.   Diet Order:  Diet NPO time specified  Skin:  Reviewed, no issues  Last BM:  8/27  Height:   Ht Readings from Last 1 Encounters:  12/21/15 5\' 8"  (1.727 m)    Weight:   Wt Readings from Last 1 Encounters:  12/22/15 299 lb 6.4 oz (135.8 kg)    Ideal Body Weight:  70 kg  BMI:  Body mass index is 45.52 kg/m.  Estimated Nutritional Needs:   Kcal:  [id number]  Protein:  175 gm protein  Fluid:  1.5 2 L  EDUCATION NEEDS:   No education needs identified at this time  Molli Barrows, [place], [place], [place] Pager [phone] After Hours Pager [phone]

## 2015-12-22 NOTE — Progress Notes (Signed)
Assessment/Plan: 1. Acute GI Bleed: HGB 9. 2,  s/p PRBCs 2. Chest pain: per CCM 3. Hyperkalemia: K+improved 4.  ESRD - MWF-[facility]. Tolerating CRRT due to hypotension/levophed, will increase UF to 100cc/hr until clots and plan to transition to IHD 5.  Chronic Hypotension  -on midodrine for BP 10 mg PO TID, currently on Levophed.  6.  Anemia  -  Last in-center HGB 12.9 Fe 92 TIBC 219 Tsat 42%.  12/16/15. No ESA/has been on weekly Fe  Dialysis Orders: [facility] MWF 4 hours 15 minutes 250/800 EDW 127 2.0 K/2.25 Ca UF profile 4 Maturing LUA AVF/RIJ TDC  Subjective: Interval History: Tol CRRT and weaning pressor, for EGD today, remains on vent  Objective: Vital signs in last 24 hours: Temp:  [96.6 F (35.9 C)-98.3 F (36.8 C)] 98.3 F (36.8 C) (08/29 0452) Pulse Rate:  [90-116] 91 (08/29 1015) Resp:  [9-33] 26 (08/29 1015) BP: (72-188)/(19-134) 154/40 (08/29 1015) SpO2:  [96 %-100 %] 100 % (08/29 1015) FiO2 (%):  [40 %-100 %] 40 % (08/29 0850) Weight:  [135.8 kg (299 lb 6.4 oz)] 135.8 kg (299 lb 6.4 oz) (08/29 0304) Weight change: 8.346 kg (18 lb 6.4 oz)  Intake/Output from previous day: 08/28 0701 - 08/29 0700 In: 5096.7 [I.V.:3498.7; Blood:1557.9] Out: 1255 [Emesis/NG output:350] Intake/Output this shift: Total I/O In: 388.1 [I.V.:338.1; Other:50] Out: 315 [Other:315]  General appearance: awake, facial puffiness Resp: clear to auscultation bilaterally Extremities: L BKA, tr edema  Lab Results:  Recent Labs  12/21/15 1115  12/21/15 1949 12/22/15 0516  WBC 13.8*  --   --  11.4*  HGB 7.6*  < > 9.5* 9.2*  HCT 23.1*  < > 28.5* 27.2*  PLT 232  --   --  171  < > = values in this interval not displayed. BMET:  Recent Labs  12/21/15 1840 12/22/15 0516  NA 131* 136  K 5.7* 3.9  CL 96* 100*  CO2 17* 22  GLUCOSE 411* 182*  BUN 152* 107*  CREATININE 8.78* 6.36*  CALCIUM 7.4* 7.7*   No results for input(s): PTH in the last 72 hours. Iron Studies: No results for  input(s): IRON, TIBC, TRANSFERRIN, FERRITIN in the last 72 hours. Studies/Results: Dg Chest Port 1 View  Result Date: 12/22/2015 CLINICAL DATA:  Respiratory failure.  Assess endotracheal tube. EXAM: PORTABLE CHEST 1 VIEW COMPARISON:  Chest radiograph 12/21/2015 FINDINGS: Support lines and tubes are in unchanged position. The tip of the endotracheal tube remains at the level of the clavicular heads. Cardiomediastinal contours are unchanged. Persistent pulmonary vascular congestion. Small left pleural effusion. IMPRESSION: 1. Unchanged support lines and tubes. 2. Mild pulmonary edema. Electronically Signed   By: Ulyses Jarred M.D.   On: 12/22/2015 06:41   Dg Chest Port 1 View  Result Date: 12/21/2015 CLINICAL DATA:  Status post central line placement. EXAM: PORTABLE CHEST 1 VIEW COMPARISON:  Single-view of the chest earlier today. FINDINGS: Endotracheal tube is again seen with the tip at the top of the clavicular heads, well above the carina. Right IJ approach dialysis catheter remains in place. A new left IJ central venous catheter is seen with the tip projecting over the mid superior vena cava. No pneumothorax. There is cardiomegaly without edema. IMPRESSION: New left IJ catheter tip projects in the mid superior vena cava. Negative for pneumothorax. No other change. Electronically Signed   By: Inge Rise M.D.   On: 12/21/2015 15:07   Dg Chest Port 1 View  Result Date: 12/21/2015  CLINICAL DATA:  Intubated EXAM: PORTABLE CHEST 1 VIEW COMPARISON:  Chest radiograph from earlier today. FINDINGS: Endotracheal tube tip is 3.8 cm above the carina. Right internal jugular central venous catheter terminates at the cavoatrial junction. Enteric tube enters stomach with the tip not seen on this image. Stable cardiomediastinal silhouette with cardiomegaly. No pneumothorax. Stable probable trace bilateral pleural effusions. Mild pulmonary edema, not definitely changed. Patchy left lung base opacity appears  increased. IMPRESSION: 1. Well-positioned support structures as described. 2. Stable mild congestive heart failure . 3. Increased patchy left lung base opacity, favor atelectasis, cannot exclude a component of pneumonia or aspiration. Electronically Signed   By: Ilona Sorrel M.D.   On: 12/21/2015 11:26   Dg Chest Port 1 View  Result Date: 12/21/2015 CLINICAL DATA:  LEFT chest and arm pain for 2 days. History of diabetes, renal failure, CHF. EXAM: PORTABLE CHEST 1 VIEW COMPARISON:  Chest radiograph March 09, 2015 FINDINGS: Cardiac silhouette is similarly enlarged. Mediastinal silhouette is nonsuspicious, tunneled dialysis catheter via RIGHT internal jugular venous approach with distal tips projecting cavoatrial junction. Strandy densities LEFT lung base. Pulmonary vasculature appears normal, no pleural effusion or focal consolidation. No pneumothorax. Large body habitus. Osseous structures are nonsuspicious. IMPRESSION: Similar cardiomegaly, LEFT lung base atelectasis. Electronically Signed   By: Elon Alas M.D.   On: 12/21/2015 06:49    Scheduled: . sodium chloride   Intravenous Once  . sodium chloride   Intravenous Once  . sodium chloride   Intravenous Once  . chlorhexidine  15 mL Mouth Rinse BID  . fentaNYL (SUBLIMAZE) injection  50 mcg Intravenous Once  . hydrocortisone sod succinate (SOLU-CORTEF) inj  50 mg Intravenous Q6H  . insulin aspart  1-3 Units Subcutaneous Q4H  . insulin glargine  10 Units Subcutaneous Q24H  . levothyroxine  50 mcg Intravenous Daily  . mouth rinse  15 mL Mouth Rinse QID  . [START ON 12/24/2015] pantoprazole  40 mg Intravenous Q12H     LOS: 1 day   Kabao Leite C 12/22/2015,10:18 AM

## 2015-12-22 NOTE — Progress Notes (Signed)
CRITICAL VALUE ALERT  Critical value received:  Troponin 18.04  Date of notification:  12/22/15  Time of notification:  0943  Critical value read back:Yes.    Nurse who received alert:  Allegra Grana, RN  MD notified (1st page):  Dr Titus Mould  Time of first page:  [phone]  MD notified (2nd page):  Time of second page:  Responding MD:  Dr Titus Mould  Time MD responded:  [phone]

## 2015-12-22 NOTE — Interval H&P Note (Deleted)
History and Physical Interval Note:  12/22/2015 9:57 AM  Dale [name]  has presented today for surgery, with the diagnosis of bloody (dark, coffee like) NGT output and acute anemia.  The various methods of treatment have been discussed with the patient and family. After consideration of risks, benefits and other options for treatment, the patient has consented to  Procedure(s): ESOPHAGOGASTRODUODENOSCOPY (EGD) (N/A) as a surgical intervention .  [name] history has been reviewed, patient examined, no change in status, stable for surgery.  I have reviewed the patient's chart and labs.  Questions were answered to the patient's satisfaction.     Leandrew Keech

## 2015-12-22 NOTE — Progress Notes (Signed)
RT Note: Pt placed back on full support at this time due to decreased RR, will wait for pt to be more awake and try PS trial again.

## 2015-12-22 NOTE — Progress Notes (Signed)
RT Note: ETT 24@ lip on arrival to room, order to advance 1cm, ETT now 25 at lip, pt placed on PS trial at this time, no plan to extubate pt today per MD, pt tolerating PS 10/5 well at this time, RT will monitor

## 2015-12-22 NOTE — Consult Note (Signed)
PULMONARY / CRITICAL CARE MEDICINE   Name: Dale [name] MRN: [id number] DOB: August 13, 1965    ADMISSION DATE:  12/21/2015 CONSULTATION DATE:  12/21/15  REFERRING MD:  Florene Glen  CHIEF COMPLAINT:  Chest Pain  HISTORY OF PRESENT ILLNESS:  Pt is encephelopathic; therefore, this HPI is obtained from chart review. Dale [name] is a 50 y.o. male with PMH as outlined below including ESRD (M/W/F).  Per nephrology service, pt is extremely compliant with HD never missing any sessions.  On 8/27 he developed L arm pain which he initially thought was due to his "fistula not working".  Pain resolved spontaneously therefore he did feel that he needed to be evaluated.  The following morning 8/28, when he woke up to go to HD, pain was back and was worse.  He therefore went to ED for further evaluation.  In ED, he was found to have K 7, SCr 12.37, BUN 219, Hgb 6.9.  He was taken for urgent HD and after roughly 1 hour, he began to have diaphoresis, persistent L arm pain which radiated into chest, SOB, worsening anxiety.   PCCM was called and pt was emergently intubated while in HD unit.  He was transferred to ICU for further evaluation / management.   SUBJECTIVE: Insulin drip started, levo at 4, no active bleeding noted  VITAL SIGNS: BP (!) 129/103   Pulse 92   Temp 98.3 F (36.8 C) (Oral)   Resp (!) 26   Ht 5\' 8"  (1.727 m)   Wt 135.8 kg (299 lb 6.4 oz)   SpO2 100%   BMI 45.52 kg/m   HEMODYNAMICS: CVP:  [5 mmHg-85 mmHg] 12 mmHg  VENTILATOR SETTINGS: Vent Mode: PRVC FiO2 (%):  [40 %-100 %] 40 % Set Rate:  [26 bmp] 26 bmp Vt Set:  [600 mL] 600 mL PEEP:  [5 cmH20] 5 cmH20 Plateau Pressure:  [18 cmH20-27 cmH20] 26 cmH20  INTAKE / OUTPUT: I/O last 3 completed shifts: In: 5096.7 [I.V.:3498.7; Blood:1557.9; Other:40] Out: [id number] [Emesis/NG output:350; Other:905]   PHYSICAL EXAMINATION: General: Adult male, critically ill Neuro: follows commands HEENT: PERRL, line wnl Cardiovascular: RRR,  no M/R/G.  Lungs: CTA anterior, reduced bases Abdomen: BS x 4, soft, NT/ND.  Musculoskeletal: L BKA, edema left gen Skin: warm, no rashes.  LABS:  BMET  Recent Labs Lab 12/21/15 1720 12/21/15 1840 12/22/15 0516  NA 131* 131* 136  K 6.5* 5.7* 3.9  CL 95* 96* 100*  CO2 18* 17* 22  BUN 160* 152* 107*  CREATININE 9.21* 8.78* 6.36*  GLUCOSE 438* 411* 182*    Electrolytes  Recent Labs Lab 12/21/15 1720 12/21/15 1840 12/22/15 0516  CALCIUM 7.6* 7.4* 7.7*  MG  --   --  2.2  PHOS 5.8*  --  4.4    CBC  Recent Labs Lab 12/21/15 0640 12/21/15 1115 12/21/15 1840 12/21/15 1949 12/22/15 0516  WBC 12.8* 13.8*  --   --  11.4*  HGB 6.9* 7.6* 9.7* 9.5* 9.2*  HCT 21.5* 23.1* 29.5* 28.5* 27.2*  PLT 259 232  --   --  171    Coag's  Recent Labs Lab 12/21/15 1506  INR 1.20    Sepsis Markers  Recent Labs Lab 12/21/15 1125  LATICACIDVEN 3.0*    ABG  Recent Labs Lab 12/21/15 1130 12/22/15 0445  PHART 7.338* 7.359  PCO2ART 29.4* 36.6  PO2ART 462* 162*    Liver Enzymes  Recent Labs Lab 12/21/15 0640 12/21/15 1720 12/22/15 0516  AST 16  --   --  ALT 14*  --   --   ALKPHOS 47  --   --   BILITOT 0.4  --   --   ALBUMIN 2.9* 2.7* 2.4*    Cardiac Enzymes  Recent Labs Lab 12/21/15 0915 12/21/15 1720 12/21/15 1949  TROPONINI 0.26* 3.47* 7.94*    Glucose  Recent Labs Lab 12/22/15 0214 12/22/15 0337 12/22/15 0441 12/22/15 0557 12/22/15 0700 12/22/15 0750  GLUCAP 136* 148* 174* 200* 194* 207*    Imaging Dg Chest Port 1 View  Result Date: 12/22/2015 CLINICAL DATA:  Respiratory failure.  Assess endotracheal tube. EXAM: PORTABLE CHEST 1 VIEW COMPARISON:  Chest radiograph 12/21/2015 FINDINGS: Support lines and tubes are in unchanged position. The tip of the endotracheal tube remains at the level of the clavicular heads. Cardiomediastinal contours are unchanged. Persistent pulmonary vascular congestion. Small left pleural effusion. IMPRESSION:  1. Unchanged support lines and tubes. 2. Mild pulmonary edema. Electronically Signed   By: Ulyses Jarred M.D.   On: 12/22/2015 06:41   Dg Chest Port 1 View  Result Date: 12/21/2015 CLINICAL DATA:  Status post central line placement. EXAM: PORTABLE CHEST 1 VIEW COMPARISON:  Single-view of the chest earlier today. FINDINGS: Endotracheal tube is again seen with the tip at the top of the clavicular heads, well above the carina. Right IJ approach dialysis catheter remains in place. A new left IJ central venous catheter is seen with the tip projecting over the mid superior vena cava. No pneumothorax. There is cardiomegaly without edema. IMPRESSION: New left IJ catheter tip projects in the mid superior vena cava. Negative for pneumothorax. No other change. Electronically Signed   By: Inge Rise M.D.   On: 12/21/2015 15:07   Dg Chest Port 1 View  Result Date: 12/21/2015 CLINICAL DATA:  Intubated EXAM: PORTABLE CHEST 1 VIEW COMPARISON:  Chest radiograph from earlier today. FINDINGS: Endotracheal tube tip is 3.8 cm above the carina. Right internal jugular central venous catheter terminates at the cavoatrial junction. Enteric tube enters stomach with the tip not seen on this image. Stable cardiomediastinal silhouette with cardiomegaly. No pneumothorax. Stable probable trace bilateral pleural effusions. Mild pulmonary edema, not definitely changed. Patchy left lung base opacity appears increased. IMPRESSION: 1. Well-positioned support structures as described. 2. Stable mild congestive heart failure . 3. Increased patchy left lung base opacity, favor atelectasis, cannot exclude a component of pneumonia or aspiration. Electronically Signed   By: Ilona Sorrel M.D.   On: 12/21/2015 11:26     STUDIES:  CXR 8/28 > cardiomegaly.  CULTURES: None.  ANTIBIOTICS: None.  SIGNIFICANT EVENTS: 8/28 > admitted with hyperkalemia, probable GI bleed, near arrest--> on HD ETT, shock  LINES/TUBES: ETT 8/28 > Chronic  rt IJ HD Left IJ 8/28 cvl>>>  ASSESSMENT / PLAN:  HEMATOLOGIC A:   Anemia - suspect hemorrhagic due to GI bleed VTE Prophylaxis. P:  PPI infusion. SCD's only. CBC to q12h coags wnl  RENAL A:   ESRD (M/W/F). Hyperkalemia - s/p emergent HD 8/28. Uremia - concern due to GI bleed. Hyponatremia. AGMA - uremia. P:   kvo fluids Was pos 4 liters with products Chem in am  cvvhd to even goals  PULMONARY A: Acute hypoxic respiratory failure. OSA with probable OHS. Mild edema P:   Advance ett 1 cm abg reviewed, keep same MV Consider SBT, no extubation planned , hope egd this am  Keep even balance pcxr in am   CARDIOVASCULAR A:  Hypotension - suspect hemorrhagic due to GI bleed along with component  hypovolemia - vasoplegia BP low at baseline Troponin leak - suspect demand ischemia due to GI bleed.  Will consider NSTEMI given chest pain however vs demand ischemia Hx HTN, HLD, CAD, dCHF (Echo Nov 2016 with EF 60-65%, G2DD). Rel AI P:  Levophed as needed to maintain MAP goal > 55 in setting baseline hypotension Hold outpatient ASA, atorvastatin, fenofibrate,  Start this midodrine, once cleared by egd If not off levo by afternoon, start vaso Add stress steroids Echo needed Trop till peak Risk / benefit ratio would be to proceed with egd  GASTROINTESTINAL A:   Concern for active GI bleed. Nutrition. Hx GERD. P:   PPI infusion. Would favor egd this am  NPO.  INFECTIOUS A:   No indication of infection. P:   Monitor clinically / fever curve  ENDOCRINE A:   DM. Hypothyroidism. P:   SSI. Continue outpatient synthroid, change to IV formulation. Assess TSH. Off insulin drip to lantus, ssi  NEUROLOGIC A:   Acute encephalopathy. Hx fibromyalgia. P:   Sedation:  Fentanyl gtt / Midazolam PRN. RASS goal: 0 to -1. Daily WUA. Hold outpatient gabapentin, dilaudid, phenergan, ropinirole.  Family updated: none at bedside, I updated pt  Interdisciplinary  Family Meeting v Palliative Care Meeting:  Due by: 12/27/15.  CC time: 40 minutes.  Lavon Paganini. Titus Mould, MD, [place] Pgr: [facility] 12/22/2015 8:09 AM

## 2015-12-22 NOTE — Progress Notes (Addendum)
[facility] ROUNDING NOTE   Subjective: Intubated on vent. On CVVHD with improvement of K. Received total of 4U prbc Hgb 6-->9.2. No melena or blood through NG tube. Patient has rising Troponin level 18 this AM and he had c/o chest pain   Objective: Vital signs in last 24 hours: Temp:  [97.6 F (36.4 C)-98.5 F (36.9 C)] 98.5 F (36.9 C) (08/29 1130) Pulse Rate:  [90-116] 93 (08/29 1300) Resp:  [9-26] 26 (08/29 1300) BP: (82-188)/(19-134) 109/35 (08/29 1300) SpO2:  [97 %-100 %] 100 % (08/29 1300) FiO2 (%):  [40 %] 40 % (08/29 1200) Weight:  [299 lb 6.4 oz (135.8 kg)] 299 lb 6.4 oz (135.8 kg) (08/29 0304) Last BM Date: 12/20/15 General: NAD, responds to verbal commands, intubated Lungs: on vent, clear Heart: s1s2 Abdomen:soft, distended tympanic, non tender Ext: R 1+ edema, L BKA    Intake/Output from previous day: 08/28 0701 - 08/29 0700 In: 5096.7 [I.V.:3498.7; Blood:1557.9] Out: 1255 [Emesis/NG output:350] Intake/Output this shift: Total I/O In: 661 [I.V.:551; Other:110] Out: 942 [Emesis/NG output:100; Other:842]   Lab Results:  Recent Labs  12/21/15 0640  12/21/15 1115 12/21/15 1840 12/21/15 1949 12/22/15 0516  WBC 12.8*  --  13.8*  --   --  11.4*  HGB 6.9*  < > 7.6* 9.7* 9.5* 9.2*  PLT 259  --  232  --   --  171  MCV 101.9*  --  97.9  --   --  92.5  < > = values in this interval not displayed. BMET  Recent Labs  12/21/15 1720 12/21/15 1840 12/22/15 0516  NA 131* 131* 136  K 6.5* 5.7* 3.9  CL 95* 96* 100*  CO2 18* 17* 22  GLUCOSE 438* 411* 182*  BUN 160* 152* 107*  CREATININE 9.21* 8.78* 6.36*  CALCIUM 7.6* 7.4* 7.7*   LFT  Recent Labs  12/21/15 0640 12/21/15 1720 12/22/15 0516  PROT 6.1*  --   --   ALBUMIN 2.9* 2.7* 2.4*  AST 16  --   --   ALT 14*  --   --   ALKPHOS 47  --   --   BILITOT 0.4  --   --    PT/INR  Recent Labs  12/21/15 1506  INR 1.20      Imaging/Other results: Dg Chest Port 1 View  Result  Date: 12/22/2015 CLINICAL DATA:  Respiratory failure.  Assess endotracheal tube. EXAM: PORTABLE CHEST 1 VIEW COMPARISON:  Chest radiograph 12/21/2015 FINDINGS: Support lines and tubes are in unchanged position. The tip of the endotracheal tube remains at the level of the clavicular heads. Cardiomediastinal contours are unchanged. Persistent pulmonary vascular congestion. Small left pleural effusion. IMPRESSION: 1. Unchanged support lines and tubes. 2. Mild pulmonary edema. Electronically Signed   By: Ulyses Jarred M.D.   On: 12/22/2015 06:41   Dg Chest Port 1 View  Result Date: 12/21/2015 CLINICAL DATA:  Status post central line placement. EXAM: PORTABLE CHEST 1 VIEW COMPARISON:  Single-view of the chest earlier today. FINDINGS: Endotracheal tube is again seen with the tip at the top of the clavicular heads, well above the carina. Right IJ approach dialysis catheter remains in place. A new left IJ central venous catheter is seen with the tip projecting over the mid superior vena cava. No pneumothorax. There is cardiomegaly without edema. IMPRESSION: New left IJ catheter tip projects in the mid superior vena cava. Negative for pneumothorax. No other change. Electronically Signed   By: Inge Rise M.D.  On: 12/21/2015 15:07   Dg Chest Port 1 View  Result Date: 12/21/2015 CLINICAL DATA:  Intubated EXAM: PORTABLE CHEST 1 VIEW COMPARISON:  Chest radiograph from earlier today. FINDINGS: Endotracheal tube tip is 3.8 cm above the carina. Right internal jugular central venous catheter terminates at the cavoatrial junction. Enteric tube enters stomach with the tip not seen on this image. Stable cardiomediastinal silhouette with cardiomegaly. No pneumothorax. Stable probable trace bilateral pleural effusions. Mild pulmonary edema, not definitely changed. Patchy left lung base opacity appears increased. IMPRESSION: 1. Well-positioned support structures as described. 2. Stable mild congestive heart failure . 3.  Increased patchy left lung base opacity, favor atelectasis, cannot exclude a component of pneumonia or aspiration. Electronically Signed   By: Ilona Sorrel M.D.   On: 12/21/2015 11:26   Dg Chest Port 1 View  Result Date: 12/21/2015 CLINICAL DATA:  LEFT chest and arm pain for 2 days. History of diabetes, renal failure, CHF. EXAM: PORTABLE CHEST 1 VIEW COMPARISON:  Chest radiograph March 09, 2015 FINDINGS: Cardiac silhouette is similarly enlarged. Mediastinal silhouette is nonsuspicious, tunneled dialysis catheter via RIGHT internal jugular venous approach with distal tips projecting cavoatrial junction. Strandy densities LEFT lung base. Pulmonary vasculature appears normal, no pleural effusion or focal consolidation. No pneumothorax. Large body habitus. Osseous structures are nonsuspicious. IMPRESSION: Similar cardiomegaly, LEFT lung base atelectasis. Electronically Signed   By: Elon Alas M.D.   On: 12/21/2015 06:49      Assessment and Plan  23 yr M with ESRD on HD and multiple co morbidities admitted with hyperkalemia 7, and uremia BUN 219 and acute anemia with drop in Hgb from 12 (on 8/23) to 6.9.  Acute anemia is likely secondary to blood loss but patient hasn't had any overt GI bleed (melena, hematochezia or hematemesis). Positive fecal heme occult doesn't explain the 5 - 6 unit drop in Hgb within a week.  Initial plan was to do EGD this AM after correction of hyperkalemia but he continue to have a rise in Troponin to 18 this morning, hasn't peaked yet and patient also has associated chest pain concerning for ACS/NSTEMI   Will hold off EGD until resolution of ACS and has down trending troponin given lack of active ongoing GI blood loss, or will plan for emergent EGD if patient starts having active bleed Hgb stable s/p blood transfusion  Continue empiric PPI gtt for 72 hours and then switch to IV PPI BID Avoid NSAID's Please call with any change in status or questions   Dale [name] , MD [phone] Mon-Fri 8a-5p [phone] after 5p, weekends, holidays [facility]

## 2015-12-22 NOTE — Care Management Note (Signed)
Case Management Note  Patient Details  Name: Dale [name] MRN: [id number] Date of Birth: Oct 22, 1965  Subjective/Objective:      Pt admitted with arm pain and chest pain/acute on chronic renal failure   - pt is intubated on CRRT           Action/Plan:  Pt is from home.  CM will continue to follow    Expected Discharge Date:                  Expected Discharge Plan:     In-House Referral:     Discharge planning Services  CM Consult  Post Acute Care Choice:    Choice offered to:     DME Arranged:    DME Agency:     HH Arranged:    HH Agency:     Status of Service:  In process, will continue to follow  If discussed at Long Length of Stay Meetings, dates discussed:    Additional Comments:  Maryclare Labrador, RN 12/22/2015, 3:41 PM

## 2015-12-23 ENCOUNTER — Inpatient Hospital Stay (HOSPITAL_COMMUNITY): Payer: Medicare Other

## 2015-12-23 ENCOUNTER — Ambulatory Visit (HOSPITAL_COMMUNITY): Payer: Medicare Other

## 2015-12-23 ENCOUNTER — Encounter (HOSPITAL_COMMUNITY)
Admission: EM | Disposition: A | Discharge status: Home Health | Payer: Self-pay | Source: Home / Self Care | Attending: Internal Medicine

## 2015-12-23 DIAGNOSIS — Z452 Encounter for adjustment and management of vascular access device: Secondary | ICD-10-CM

## 2015-12-23 DIAGNOSIS — I213 ST elevation (STEMI) myocardial infarction of unspecified site: Secondary | ICD-10-CM

## 2015-12-23 LAB — RENAL FUNCTION PANEL
Albumin: 2.4 g/dL — ABNORMAL LOW (ref 3.5–5.0)
Albumin: 2.7 g/dL — ABNORMAL LOW (ref 3.5–5.0)
Albumin: 2.8 g/dL — ABNORMAL LOW (ref 3.5–5.0)
Anion gap: 12 (ref 5–15)
Anion gap: 9 (ref 5–15)
Anion gap: 11 (ref 5–15)
BUN: 88 mg/dL — ABNORMAL HIGH (ref 6–20)
BUN: 95 mg/dL — ABNORMAL HIGH (ref 6–20)
BUN: 66 mg/dL — ABNORMAL HIGH (ref 6–20)
CO2: 22 mmol/L (ref 22–32)
CO2: 22 mmol/L (ref 22–32)
CO2: 24 mmol/L (ref 22–32)
Calcium: 8.2 mg/dL — ABNORMAL LOW (ref 8.9–10.3)
Calcium: 8.2 mg/dL — ABNORMAL LOW (ref 8.9–10.3)
Calcium: 8.4 mg/dL — ABNORMAL LOW (ref 8.9–10.3)
Chloride: 104 mmol/L (ref 101–111)
Chloride: 106 mmol/L (ref 101–111)
Chloride: 102 mmol/L (ref 101–111)
Creatinine, Ser: 6.99 mg/dL — ABNORMAL HIGH (ref 0.61–1.24)
Creatinine, Ser: 7.65 mg/dL — ABNORMAL HIGH (ref 0.61–1.24)
Creatinine, Ser: 5.44 mg/dL — ABNORMAL HIGH (ref 0.61–1.24)
GFR calc Af Amer: 9 mL/min — ABNORMAL LOW (ref >60)
GFR calc Af Amer: 9 mL/min — ABNORMAL LOW (ref >60)
GFR calc Af Amer: 13 mL/min — ABNORMAL LOW (ref >60)
GFR calc non Af Amer: 8 mL/min — ABNORMAL LOW (ref >60)
GFR calc non Af Amer: 7 mL/min — ABNORMAL LOW (ref >60)
GFR calc non Af Amer: 11 mL/min — ABNORMAL LOW (ref >60)
Glucose, Bld: 239 mg/dL — ABNORMAL HIGH (ref 65–99)
Glucose, Bld: 294 mg/dL — ABNORMAL HIGH (ref 65–99)
Glucose, Bld: 224 mg/dL — ABNORMAL HIGH (ref 65–99)
Phosphorus: 8.3 mg/dL — ABNORMAL HIGH (ref 2.5–4.6)
Phosphorus: 10.2 mg/dL — ABNORMAL HIGH (ref 2.5–4.6)
Phosphorus: 6.7 mg/dL — ABNORMAL HIGH (ref 2.5–4.6)
Potassium: 5 mmol/L (ref 3.5–5.1)
Potassium: 6.1 mmol/L — ABNORMAL HIGH (ref 3.5–5.1)
Potassium: 4.9 mmol/L (ref 3.5–5.1)
Sodium: 138 mmol/L (ref 135–145)
Sodium: 137 mmol/L (ref 135–145)
Sodium: 137 mmol/L (ref 135–145)

## 2015-12-23 LAB — ECHOCARDIOGRAM COMPLETE
E decel time: 218 msec
E/e' ratio: 23.06
Height: 68 in
LA vol A4C: 51.3 ml
LA vol index: 25.3 mL/m2
LA vol: 67.1 mL
LV E/e' medial: 23.06
LV E/e'average: 23.06
LV dias vol index: 57 mL/m2
LV dias vol: 152 mL — AB (ref 62–150)
LV e' LATERAL: 6.2 cm/s
LV sys vol index: 20 mL/m2
LV sys vol: 53 mL (ref 21–61)
LVOT SV: 129 mL
LVOT VTI: 34 cm
LVOT area: 3.8 cm2
LVOT diameter: 22 mm
LVOT peak grad rest: 9 mmHg
LVOT peak vel: 151 cm/s
MV Dec: 218
MV Peak grad: 8 mmHg
MV pk A vel: 138 m/s
MV pk E vel: 143 m/s
Simpson's disk: 66
Stroke v: 100 ml
TAPSE: 22 mm
TDI e' lateral: 6.2
TDI e' medial: 5
Weight: 4876.8 oz

## 2015-12-23 LAB — GLUCOSE, CAPILLARY
Glucose-Capillary: 148 mg/dL — ABNORMAL HIGH (ref 65–99)
Glucose-Capillary: 154 mg/dL — ABNORMAL HIGH (ref 65–99)
Glucose-Capillary: 174 mg/dL — ABNORMAL HIGH (ref 65–99)
Glucose-Capillary: 180 mg/dL — ABNORMAL HIGH (ref 65–99)
Glucose-Capillary: 184 mg/dL — ABNORMAL HIGH (ref 65–99)
Glucose-Capillary: 193 mg/dL — ABNORMAL HIGH (ref 65–99)
Glucose-Capillary: 200 mg/dL — ABNORMAL HIGH (ref 65–99)
Glucose-Capillary: 226 mg/dL — ABNORMAL HIGH (ref 65–99)
Glucose-Capillary: 231 mg/dL — ABNORMAL HIGH (ref 65–99)
Glucose-Capillary: 257 mg/dL — ABNORMAL HIGH (ref 65–99)

## 2015-12-23 LAB — POCT I-STAT 3, ART BLOOD GAS (G3+)
Acid-base deficit: 5 mmol/L — ABNORMAL HIGH (ref 0.0–2.0)
Bicarbonate: 22.2 mmol/L (ref 20.0–28.0)
O2 Saturation: 98 %
Patient temperature: 97.5
TCO2: 24 mmol/L (ref 0–100)
pCO2 arterial: 47.5 mmHg (ref 32.0–48.0)
pH, Arterial: 7.275 — ABNORMAL LOW (ref 7.350–7.450)
pO2, Arterial: 120 mmHg — ABNORMAL HIGH (ref 83.0–108.0)

## 2015-12-23 LAB — COMPREHENSIVE METABOLIC PANEL
ALT: 25 U/L (ref 17–63)
AST: 39 U/L (ref 15–41)
Albumin: 2.4 g/dL — ABNORMAL LOW (ref 3.5–5.0)
Alkaline Phosphatase: 45 U/L (ref 38–126)
Anion gap: 12 (ref 5–15)
BUN: 85 mg/dL — ABNORMAL HIGH (ref 6–20)
CO2: 21 mmol/L — ABNORMAL LOW (ref 22–32)
Calcium: 8.1 mg/dL — ABNORMAL LOW (ref 8.9–10.3)
Chloride: 104 mmol/L (ref 101–111)
Creatinine, Ser: 6.67 mg/dL — ABNORMAL HIGH (ref 0.61–1.24)
GFR calc Af Amer: 10 mL/min — ABNORMAL LOW (ref >60)
GFR calc non Af Amer: 9 mL/min — ABNORMAL LOW (ref >60)
Glucose, Bld: 185 mg/dL — ABNORMAL HIGH (ref 65–99)
Potassium: 5 mmol/L (ref 3.5–5.1)
Sodium: 137 mmol/L (ref 135–145)
Total Bilirubin: 0.3 mg/dL (ref 0.3–1.2)
Total Protein: 5.1 g/dL — ABNORMAL LOW (ref 6.5–8.1)

## 2015-12-23 LAB — CBC
HCT: 23.1 % — ABNORMAL LOW (ref 39.0–52.0)
HCT: 24.8 % — ABNORMAL LOW (ref 39.0–52.0)
HCT: 25.3 % — ABNORMAL LOW (ref 39.0–52.0)
Hemoglobin: 7.5 g/dL — ABNORMAL LOW (ref 13.0–17.0)
Hemoglobin: 7.8 g/dL — ABNORMAL LOW (ref 13.0–17.0)
Hemoglobin: 8.1 g/dL — ABNORMAL LOW (ref 13.0–17.0)
MCH: 31.1 pg (ref 26.0–34.0)
MCH: 30.8 pg (ref 26.0–34.0)
MCH: 30.9 pg (ref 26.0–34.0)
MCHC: 32.5 g/dL (ref 30.0–36.0)
MCHC: 31.5 g/dL (ref 30.0–36.0)
MCHC: 32 g/dL (ref 30.0–36.0)
MCV: 95.9 fL (ref 78.0–100.0)
MCV: 98 fL (ref 78.0–100.0)
MCV: 96.6 fL (ref 78.0–100.0)
Platelets: 143 10*3/uL — ABNORMAL LOW (ref 150–400)
Platelets: 154 10*3/uL (ref 150–400)
Platelets: 157 10*3/uL (ref 150–400)
RBC: 2.41 MIL/uL — ABNORMAL LOW (ref 4.22–5.81)
RBC: 2.53 MIL/uL — ABNORMAL LOW (ref 4.22–5.81)
RBC: 2.62 MIL/uL — ABNORMAL LOW (ref 4.22–5.81)
RDW: 17.4 % — ABNORMAL HIGH (ref 11.5–15.5)
RDW: 17.4 % — ABNORMAL HIGH (ref 11.5–15.5)
RDW: 17.2 % — ABNORMAL HIGH (ref 11.5–15.5)
WBC: 9.5 10*3/uL (ref 4.0–10.5)
WBC: 12.8 10*3/uL — ABNORMAL HIGH (ref 4.0–10.5)
WBC: 13.1 10*3/uL — ABNORMAL HIGH (ref 4.0–10.5)

## 2015-12-23 LAB — MAGNESIUM: Magnesium: 2.3 mg/dL (ref 1.7–2.4)

## 2015-12-23 LAB — TROPONIN I: Troponin I: 5.43 ng/mL (ref <0.03)

## 2015-12-23 LAB — PHOSPHORUS: Phosphorus: 6.5 mg/dL — ABNORMAL HIGH (ref 2.5–4.6)

## 2015-12-23 SURGERY — EGD (ESOPHAGOGASTRODUODENOSCOPY)
Anesthesia: Moderate Sedation

## 2015-12-23 MED ORDER — SODIUM CHLORIDE 0.9 % IV SOLN: 100.0000 mL | INTRAVENOUS | Status: DC | PRN

## 2015-12-23 MED ORDER — ALTEPLASE 2 MG IJ SOLR
2.0000 mg | Freq: Once | INTRAMUSCULAR | Status: DC | PRN
  Filled 2015-12-23: qty 2

## 2015-12-23 MED ORDER — HEPARIN SODIUM (PORCINE) 1000 UNIT/ML DIALYSIS
1000.0000 [IU] | INTRAMUSCULAR | Status: DC | PRN
  Filled 2015-12-23: qty 1

## 2015-12-23 MED ORDER — INSULIN ASPART 100 UNIT/ML ~~LOC~~ SOLN
1.0000 [IU] | SUBCUTANEOUS | Status: DC
  Administered 2015-12-23: 1 [IU] via SUBCUTANEOUS
  Administered 2015-12-23: 3 [IU] via SUBCUTANEOUS
  Administered 2015-12-23: 2 [IU] via SUBCUTANEOUS
  Administered 2015-12-23: 3 [IU] via SUBCUTANEOUS
  Administered 2015-12-24: 2 [IU] via SUBCUTANEOUS
  Administered 2015-12-24: 3 [IU] via SUBCUTANEOUS

## 2015-12-23 MED ORDER — LIDOCAINE-PRILOCAINE 2.5-2.5 % EX CREA
1.0000 "application " | TOPICAL_CREAM | CUTANEOUS | Status: DC | PRN
  Filled 2015-12-23: qty 5

## 2015-12-23 MED ORDER — INSULIN GLARGINE 100 UNIT/ML ~~LOC~~ SOLN
15.0000 [IU] | Freq: Every day | SUBCUTANEOUS | Status: DC
  Administered 2015-12-23 – 2015-12-24 (×2): 15 [IU] via SUBCUTANEOUS
  Filled 2015-12-23 (×3): qty 0.15

## 2015-12-23 MED ORDER — PENTAFLUOROPROP-TETRAFLUOROETH EX AERO: 1.0000 "application " | INHALATION_SPRAY | CUTANEOUS | Status: DC | PRN

## 2015-12-23 MED ORDER — DEXTROSE 10 % IV SOLN: INTRAVENOUS | Status: DC | PRN

## 2015-12-23 MED ORDER — HEPARIN SODIUM (PORCINE) 1000 UNIT/ML DIALYSIS
20.0000 [IU]/kg | INTRAMUSCULAR | Status: DC | PRN
  Filled 2015-12-23: qty 3

## 2015-12-23 MED ORDER — LIDOCAINE HCL (PF) 1 % IJ SOLN: 5.0000 mL | INTRAMUSCULAR | Status: DC | PRN

## 2015-12-23 MED ORDER — HYDROMORPHONE HCL 1 MG/ML IJ SOLN
1.0000 mg | INTRAMUSCULAR | Status: DC | PRN
  Administered 2015-12-23 – 2015-12-24 (×2): 1 mg via INTRAVENOUS
  Filled 2015-12-23 (×2): qty 1

## 2015-12-23 MED ORDER — PERFLUTREN LIPID MICROSPHERE
1.0000 mL | INTRAVENOUS | Status: AC | PRN
  Administered 2015-12-23: 2 mL via INTRAVENOUS
  Filled 2015-12-23: qty 10

## 2015-12-23 NOTE — Consult Note (Signed)
PULMONARY / CRITICAL CARE MEDICINE   Name: Dale [name] MRN: [id number] DOB: 10-01-1965    ADMISSION DATE:  12/21/2015 CONSULTATION DATE:  12/21/15  REFERRING MD:  Florene Glen  CHIEF COMPLAINT:  Chest Pain  HISTORY OF PRESENT ILLNESS:  Pt is encephelopathic; therefore, this HPI is obtained from chart review. Dale [name] is a 50 y.o. male with PMH as outlined below including ESRD (M/W/F).  Per nephrology service, pt is extremely compliant with HD never missing any sessions.  On 8/27 he developed L arm pain which he initially thought was due to his "fistula not working".  Pain resolved spontaneously therefore he did feel that he needed to be evaluated.  The following morning 8/28, when he woke up to go to HD, pain was back and was worse.  He therefore went to ED for further evaluation.  In ED, he was found to have K 7, SCr 12.37, BUN 219, Hgb 6.9.  He was taken for urgent HD and after roughly 1 hour, he began to have diaphoresis, persistent L arm pain which radiated into chest, SOB, worsening anxiety.   PCCM was called and pt was emergently intubated while in HD unit.  He was transferred to ICU for further evaluation / management.   SUBJECTIVE: Trop high peak Remains on vent No bleeding EGD held do to risk and improved clinical status cvvhd stopped  Levo off, vaso on  VITAL SIGNS: BP (!) 113/31   Pulse 96   Temp 98.5 F (36.9 C) (Oral)   Resp 20   Ht 5\' 8"  (1.727 m)   Wt (!) 138.3 kg (304 lb 12.8 oz)   SpO2 98%   BMI 46.34 kg/m   HEMODYNAMICS: CVP:  [7 mmHg-82 mmHg] 9 mmHg  VENTILATOR SETTINGS: Vent Mode: PRVC FiO2 (%):  [40 %] 40 % Set Rate:  [26 bmp] 26 bmp Vt Set:  [600 mL] 600 mL PEEP:  [5 cmH20] 5 cmH20 Pressure Support:  [10 cmH20] 10 cmH20 Plateau Pressure:  [18 cmH20-24 cmH20] 24 cmH20  INTAKE / OUTPUT: I/O last 3 completed shifts: In: 4097.5 [I.V.:3887.5; Other:210] Out: 4206 [Emesis/NG output:625; Other:3581]   PHYSICAL EXAMINATION: General: Adult  male, critically ill, wide awake Neuro: follows commands, fully alert, wants tube out HEENT: PERRL, line wnl Cardiovascular: RRR, no M/R/G.  Lungs: reduced Abdomen: BS x 4, soft, NT/ND.  Musculoskeletal: L BKA, edema left gen Skin: warm, no rashes.  LABS:  BMET  Recent Labs Lab 12/22/15 0516 12/22/15 1612 12/23/15 0615  NA 136 134* 137  K 3.9 4.7 5.0  CL 100* 102 104  CO2 22 20* 21*  BUN 107* 75* 85*  CREATININE 6.36* 5.21* 6.67*  GLUCOSE 182* 247* 185*    Electrolytes  Recent Labs Lab 12/22/15 0516 12/22/15 1612 12/23/15 0615 12/23/15 0622  CALCIUM 7.7* 7.8* 8.1*  --   MG 2.2  --  2.3  --   PHOS 4.4 4.7*  --  6.5*    CBC  Recent Labs Lab 12/21/15 0640  12/21/15 1115 12/21/15 1840 12/21/15 1949 12/22/15 0516  WBC 12.8*  --  13.8*  --   --  11.4*  HGB 6.9*  < > 7.6* 9.7* 9.5* 9.2*  HCT 21.5*  < > 23.1* 29.5* 28.5* 27.2*  PLT 259  --  232  --   --  171  < > = values in this interval not displayed.  Coag's  Recent Labs Lab 12/21/15 1506  INR 1.20    Sepsis Markers  Recent Labs Lab 12/21/15 1125  LATICACIDVEN 3.0*    ABG  Recent Labs Lab 12/21/15 1130 12/22/15 0445  PHART 7.338* 7.359  PCO2ART 29.4* 36.6  PO2ART 462* 162*    Liver Enzymes  Recent Labs Lab 12/21/15 0640  12/22/15 0516 12/22/15 1612 12/23/15 0615  AST 16  --   --   --  39  ALT 14*  --   --   --  25  ALKPHOS 47  --   --   --  45  BILITOT 0.4  --   --   --  0.3  ALBUMIN 2.9*  < > 2.4* 2.5* 2.4*  < > = values in this interval not displayed.  Cardiac Enzymes  Recent Labs Lab 12/21/15 1949 12/22/15 0840 12/23/15 0615  TROPONINI 7.94* 18.04* 5.43*    Glucose  Recent Labs Lab 12/23/15 0014 12/23/15 0119 12/23/15 0217 12/23/15 0330 12/23/15 0511 12/23/15 0716  GLUCAP 154* 174* 180* 184* 148* 193*    Imaging Dg Chest Port 1 View  Result Date: 12/23/2015 CLINICAL DATA:  Respiratory failure. EXAM: PORTABLE CHEST 1 VIEW COMPARISON:  12/22/2015.  FINDINGS: Endotracheal tube, NG tube, left eye J line, large caliber right IJ line in stable position. Cardiomegaly with mild pulmonary venous congestion and interstitial prominence suggesting mild congestive heart failure. Small left pleural effusion. Chest is unchanged from prior exam. No pneumothorax . IMPRESSION: 1. Lines and tubes in stable position. 2. Persistent changes of mild congestive heart failure with cardiomegaly, mild pulmonary venous congestion, mild interstitial edema and small left pleural effusion. No interim change from prior exam. Electronically Signed   By: Marcello Moores  Register   On: 12/23/2015 06:58     STUDIES:  CXR 8/28 > cardiomegaly.  CULTURES: None.  ANTIBIOTICS: None.  SIGNIFICANT EVENTS: 8/28 > admitted with hyperkalemia, probable GI bleed, near arrest--> on HD ETT, shock  LINES/TUBES: ETT 8/28 > Chronic rt IJ HD Left IJ 8/28 cvl>>>  ASSESSMENT / PLAN:  HEMATOLOGIC A:   Anemia - suspect hemorrhagic due to GI bleed VTE Prophylaxis. P:  PPI infusion. scd Cbc to daily  RENAL A:   ESRD (M/W/F). Hyperkalemia - s/p emergent HD 8/28. Uremia - concern due to GI bleed. Hyponatremia. AGMA - uremia. P:   Hd per renal Off cvvhd  PULMONARY A: Acute hypoxic respiratory failure. OSA with probable OHS. Mild edema P:   Get eet out 2 cm if not extubated Place cpap 5 ps 5, goal 30 min , assess rsbi Neurostatus supports extubation Neg balance needed still, some int prominence  CARDIOVASCULAR A:  Hypotension - suspect hemorrhagic due to GI bleed along with component hypovolemia - vasoplegia BP low at baseline Troponin leak - suspect demand ischemia due to GI bleed.  Will consider NSTEMI given chest pain however vs demand ischemia Hx HTN, HLD, CAD, dCHF (Echo Nov 2016 with EF 60-65%, G2DD). Rel AI P:  Keep stress roids Off levophed, vaso titration to map 55, sys 85 Echo pending Will need risk stratification from cards pre dc Keep hgb 8.4 Unable  to use asa or anticoaguatation  GASTROINTESTINAL A:   Concern for active GI bleed. Nutrition. Hx GERD. P:   PPI infusion Agree could not do egd, with trop noted 18, with good clinical course, will get egd in future pre dc NPO If not extubated today will start trickel TF  INFECTIOUS A:   No indication of infection. P:   Monitor clinically / fever curve  ENDOCRINE A:   DM. Hypothyroidism. P:  SSI. Continue outpatient synthroid, change to IV formulation. Re assess tsh lantus  NEUROLOGIC A:   Acute encephalopathy. Hx fibromyalgia. P:   Sedation:  Fentanyl gtt / Midazolam PRN. RASS goal: 0 to -1. Daily WUA. Hold outpatient gabapentin, dilaudid, phenergan, ropinirole.  Family updated: none at bedside, I updated pt  Interdisciplinary Family Meeting v Palliative Care Meeting:  Due by: 12/27/15.  CC time: 30 minutes.  Lavon Paganini. Titus Mould, MD, [place] Pgr: [facility] 12/23/2015 8:38 AM

## 2015-12-23 NOTE — Progress Notes (Signed)
Pt extubated per doctor to a 4 LNC.Pt was able to cough and say his name. glidescope was at beside per doctor. Pt looks good and vitals are good

## 2015-12-23 NOTE — Progress Notes (Signed)
PT Cancellation Note  Patient Details Name: Dale [name] MRN: [id number] DOB: February 19, 1966   Cancelled Treatment:    Reason Eval/Treat Not Completed: Medical issues which prohibited therapy; patient s/p extubation this morning.  RN reports pt oriented, but "just not right" pulling at all lines and with some anxiety.  Will cancel for today and attempt to see tomorrow.   Reginia Naas 12/23/2015, 2:29 PM  Magda Kiel, [place] 12/23/2015

## 2015-12-23 NOTE — Progress Notes (Addendum)
Inpatient Diabetes Program Recommendations  AACE/ADA: New Consensus Statement on Inpatient Glycemic Control (2015)  Target Ranges:  Prepandial:   less than 140 mg/dL      Peak postprandial:   less than 180 mg/dL (1-2 hours)      Critically ill patients:  140 - 180 mg/dL   Results for OZIL, WADLER (MRN [id number]) as of 12/23/2015 08:50  Ref. Range 12/22/2015 18:22 12/22/2015 19:42 12/22/2015 20:47 12/22/2015 21:58 12/22/2015 23:06 12/23/2015 00:14 12/23/2015 01:19 12/23/2015 02:17 12/23/2015 03:30 12/23/2015 05:11 12/23/2015 07:16  Glucose-Capillary Latest Ref Range: 65 - 99 mg/dL 195 (H) 144 (H) 126 (H) 125 (H) 138 (H) 154 (H) 174 (H) 180 (H) 184 (H) 148 (H) 193 (H)   Review of Glycemic Control  Diabetes history:DM1 Outpatient Diabetes medications: [employer] insulin pump with Humalog (followed by Dr. Starleen Blue with [facility]) Per Diabetes Coordinator note from 12/21/15:  NOTE: In reviewing chart, noted in Care Everywhere tab that patient is noted to have Type 1 diabetes and is followed by Dr. Starleen Blue with [facility] and was last seen by Dr. Starleen Blue on 08/20/15. According to home medication list the following are patient's basal rates on his insulin pump: 2400 1.5 units/hr, 0300 1.9 units/hr, 0800 1.45 units/hr, 1200 1.625 units/hr. If these basal rates are correct, patient should be receiving a total of 39.3 units/24 hours for basal needs  Inpatient Diabetes Program Recommendations: Insulin - Basal: Please consider increasing Lantus to 22 units daily (starting 12/24/15). If Lantus increased as recommended, since patient received Lantus 15 units at 2:37 am today, may want to consider ordering one time dose of Lantus 7 units x 1 now (for total of 22 units for today).  Thanks, Barnie Alderman, RN, MSN, CDE Diabetes Coordinator Inpatient Diabetes Program [phone] (Team Pager from [place] to [place]) [phone] ([place] office) [phone] [facility] office) [phone] [facility] office)

## 2015-12-23 NOTE — Procedures (Addendum)
Extubation Procedure Note  Patient Details:   Name: Dale [name] DOB: 1965/09/23 MRN: [id number]   Airway Documentation:  Airway 7.5 mm (Active)  Secured at (cm) 24 cm 12/23/2015  8:46 AM  Measured From Lips 12/23/2015  8:46 AM  [place] 12/23/2015  8:46 AM  Secured By [employer] 12/23/2015  8:46 AM  Tube Holder Repositioned Yes 12/23/2015  8:46 AM  Cuff Pressure (cm H2O) 24 cm H2O 12/23/2015  8:46 AM  Site Condition Dry 12/23/2015  8:46 AM    Evaluation  O2 sats: stable throughout Complications: No apparent complications Patient did tolerate procedure well. Bilateral Breath Sounds: Diminished, Rhonchi   Yes , pt able to speak.  No distress noted. [name] 12/23/2015, 9:12 AM

## 2015-12-23 NOTE — Progress Notes (Signed)
Daily Rounding Note  12/23/2015, 8:16 AM  LOS: 2 days   SUBJECTIVE:   Chief complaint: weakness Just extubated and coughing a lot but alert.  Stools  Brown.  NGT removed but suctionate was bilious and green for > 24 hours.  No nausea.  Will have swallow assessment and hopefully begin po this afternoon.  Denies abd pain  OBJECTIVE:         Vital signs in last 24 hours:    Temp:  [97.7 F (36.5 C)-98.6 F (37 C)] 98.5 F (36.9 C) (08/30 0718) Pulse Rate:  [86-98] 96 (08/30 0700) Resp:  [10-28] 20 (08/30 0700) BP: (74-162)/(20-70) 113/31 (08/30 0700) SpO2:  [98 %-100 %] 98 % (08/30 0700) FiO2 (%):  [40 %] 40 % (08/30 0400) Weight:  [138.3 kg (304 lb 12.8 oz)] 138.3 kg (304 lb 12.8 oz) (08/30 0500) Last BM Date: 12/20/15 Filed Weights   12/21/15 0608 12/22/15 0304 12/23/15 0500  Weight: 127.5 kg (281 lb) 135.8 kg (299 lb 6.4 oz) (!) 138.3 kg (304 lb 12.8 oz)   General: obese, cushingoid, looks unwell.  comfortable   Heart: RRR Chest: coughing Abdomen: obese, NT, active BS, soft  Extremities: + edema in right LE.  Right toe amputations and left BKA Neuro/Psych:  Oriented x 3.  Moves all 4s.   Intake/Output from previous day: 08/29 0701 - 08/30 0700 In: 2063.2 [I.V.:1853.2] Out: 2135 [Emesis/NG output:475]  Intake/Output this shift: No intake/output data recorded.  Lab Results:  Recent Labs  12/21/15 0640  12/21/15 1115 12/21/15 1840 12/21/15 1949 12/22/15 0516  WBC 12.8*  --  13.8*  --   --  11.4*  HGB 6.9*  < > 7.6* 9.7* 9.5* 9.2*  HCT 21.5*  < > 23.1* 29.5* 28.5* 27.2*  PLT 259  --  232  --   --  171  < > = values in this interval not displayed. BMET  Recent Labs  12/22/15 0516 12/22/15 1612 12/23/15 0615  NA 136 134* 137  K 3.9 4.7 5.0  CL 100* 102 104  CO2 22 20* 21*  GLUCOSE 182* 247* 185*  BUN 107* 75* 85*  CREATININE 6.36* 5.21* 6.67*  CALCIUM 7.7* 7.8* 8.1*   LFT  Recent Labs  12/21/15 0640  12/22/15 0516 12/22/15 1612 12/23/15 0615  PROT 6.1*  --   --   --  5.1*  ALBUMIN 2.9*  < > 2.4* 2.5* 2.4*  AST 16  --   --   --  39  ALT 14*  --   --   --  25  ALKPHOS 47  --   --   --  45  BILITOT 0.4  --   --   --  0.3  < > = values in this interval not displayed. PT/INR  Recent Labs  12/21/15 1506  LABPROT 15.3*  INR 1.20   Hepatitis Panel No results for input(s): HEPBSAG, HCVAB, HEPAIGM, HEPBIGM in the last 72 hours.  Studies/Results: Dg Chest Port 1 View  Result Date: 12/23/2015 CLINICAL DATA:  Respiratory failure. EXAM: PORTABLE CHEST 1 VIEW COMPARISON:  12/22/2015. FINDINGS: Endotracheal tube, NG tube, left eye J line, large caliber right IJ line in stable position. Cardiomegaly with mild pulmonary venous congestion and interstitial prominence suggesting mild congestive heart failure. Small left pleural effusion. Chest is unchanged from prior exam. No pneumothorax . IMPRESSION: 1. Lines and tubes in stable position. 2. Persistent changes of mild congestive heart failure  with cardiomegaly, mild pulmonary venous congestion, mild interstitial edema and small left pleural effusion. No interim change from prior exam. Electronically Signed   By: Marcello Moores  Register   On: 12/23/2015 06:58   Dg Chest Port 1 View  Result Date: 12/22/2015 CLINICAL DATA:  Respiratory failure.  Assess endotracheal tube. EXAM: PORTABLE CHEST 1 VIEW COMPARISON:  Chest radiograph 12/21/2015 FINDINGS: Support lines and tubes are in unchanged position. The tip of the endotracheal tube remains at the level of the clavicular heads. Cardiomediastinal contours are unchanged. Persistent pulmonary vascular congestion. Small left pleural effusion. IMPRESSION: 1. Unchanged support lines and tubes. 2. Mild pulmonary edema. Electronically Signed   By: Ulyses Jarred M.D.   On: 12/22/2015 06:41   Dg Chest Port 1 View  Result Date: 12/21/2015 CLINICAL DATA:  Status post central line placement. EXAM: PORTABLE  CHEST 1 VIEW COMPARISON:  Single-view of the chest earlier today. FINDINGS: Endotracheal tube is again seen with the tip at the top of the clavicular heads, well above the carina. Right IJ approach dialysis catheter remains in place. A new left IJ central venous catheter is seen with the tip projecting over the mid superior vena cava. No pneumothorax. There is cardiomegaly without edema. IMPRESSION: New left IJ catheter tip projects in the mid superior vena cava. Negative for pneumothorax. No other change. Electronically Signed   By: Inge Rise M.D.   On: 12/21/2015 15:07   Dg Chest Port 1 View  Result Date: 12/21/2015 CLINICAL DATA:  Intubated EXAM: PORTABLE CHEST 1 VIEW COMPARISON:  Chest radiograph from earlier today. FINDINGS: Endotracheal tube tip is 3.8 cm above the carina. Right internal jugular central venous catheter terminates at the cavoatrial junction. Enteric tube enters stomach with the tip not seen on this image. Stable cardiomediastinal silhouette with cardiomegaly. No pneumothorax. Stable probable trace bilateral pleural effusions. Mild pulmonary edema, not definitely changed. Patchy left lung base opacity appears increased. IMPRESSION: 1. Well-positioned support structures as described. 2. Stable mild congestive heart failure . 3. Increased patchy left lung base opacity, favor atelectasis, cannot exclude a component of pneumonia or aspiration. Electronically Signed   By: Ilona Sorrel M.D.   On: 12/21/2015 11:26   Scheduled Meds: . sodium chloride   Intravenous Once  . sodium chloride   Intravenous Once  . sodium chloride   Intravenous Once  . chlorhexidine  15 mL Mouth Rinse BID  . fentaNYL (SUBLIMAZE) injection  50 mcg Intravenous Once  . hydrocortisone sod succinate (SOLU-CORTEF) inj  50 mg Intravenous Q6H  . insulin aspart  1-3 Units Subcutaneous Q4H  . insulin glargine  15 Units Subcutaneous Q0600  . levothyroxine  50 mcg Intravenous Daily  . mouth rinse  15 mL Mouth Rinse  QID  . [START ON 12/24/2015] pantoprazole  40 mg Intravenous Q12H   Continuous Infusions: . sodium chloride    . dextrose    . fentaNYL infusion INTRAVENOUS 350 mcg/hr (12/23/15 0238)  . insulin (NOVOLIN-R) infusion Stopped (12/23/15 0500)  . norepinephrine (LEVOPHED) Adult infusion Stopped (12/23/15 0113)  . pantoprozole (PROTONIX) infusion 8 mg/hr (12/23/15 0530)  . dialysis replacement fluid (prismasate) 300 mL/hr at 12/22/15 0428  . dialysis replacement fluid (prismasate) 300 mL/hr at 12/22/15 0428  . dialysate (PRISMASATE) 2,000 mL/hr at 12/22/15 1513  . vasopressin (PITRESSIN) infusion - *FOR SHOCK* 0.03 Units/min (12/22/15 1800)   PRN Meds:.sodium chloride, dextrose, fentaNYL, heparin, midazolam, midazolam, sodium chloride   ASSESMENT:   *  Acute anemia.  FOBT +, black NGT output, no  melena, BPR.  S/p PRBC x 4.  On Protonix gtt.   *   ESRD.  On CVVH for hyperkalemia.  *   Elevated Troponins, CCM suspects demand ischemia due to anemia.  Possible non STEMI, ACS.   *  Encephalopathy.    PLAN   *   EGD tomorrow.      Azucena Freed  12/23/2015, 8:16 AM Pager: [phone]

## 2015-12-23 NOTE — Progress Notes (Signed)
  Echocardiogram 2D Echocardiogram has been performed with definity.  Aggie Cosier 12/23/2015, 3:28 PM

## 2015-12-23 NOTE — Progress Notes (Signed)
Assessment/Plan: 1. Acute GI Bleed:HGB 9. 2,  s/p PRBCs 2. Chest pain: per CCM 3. Hyperkalemia: K+improved 4. ESRD- MWF-[facility]. Taken off CRRT yesterday.  HD Thursday 5. Chronic Hypotension-on midodrine pre HD 6. Anemia-    Subjective: Interval History: Just extubated  Objective: Vital signs in last 24 hours: Temp:  [97.7 F (36.5 C)-98.6 F (37 C)] 98.5 F (36.9 C) (08/30 0718) Pulse Rate:  [86-96] 96 (08/30 0700) Resp:  [17-28] 20 (08/30 0700) BP: (74-162)/(20-70) 113/31 (08/30 0700) SpO2:  [98 %-100 %] 98 % (08/30 0700) FiO2 (%):  [40 %] 40 % (08/30 0400) Weight:  [138.3 kg (304 lb 12.8 oz)] 138.3 kg (304 lb 12.8 oz) (08/30 0500) Weight change: 2.449 kg (5 lb 6.4 oz)  Intake/Output from previous day: 08/29 0701 - 08/30 0700 In: 2063.2 [I.V.:1853.2] Out: 2135 [Emesis/NG output:475] Intake/Output this shift: No intake/output data recorded.  General appearance: alert and cooperative Resp: clear to auscultation bilaterally, anteriorly Cardio: regular rate and rhythm, S1, S2 normal, no murmur, click, rub or gallop Extremities: BKA on L, 1+; LUE AVF with thrill  Lab Results:  Recent Labs  12/21/15 1115  12/21/15 1949 12/22/15 0516  WBC 13.8*  --   --  11.4*  HGB 7.6*  < > 9.5* 9.2*  HCT 23.1*  < > 28.5* 27.2*  PLT 232  --   --  171  < > = values in this interval not displayed. BMET:  Recent Labs  12/22/15 1612 12/23/15 0615  NA 134* 137  K 4.7 5.0  CL 102 104  CO2 20* 21*  GLUCOSE 247* 185*  BUN 75* 85*  CREATININE 5.21* 6.67*  CALCIUM 7.8* 8.1*   No results for input(s): PTH in the last 72 hours. Iron Studies: No results for input(s): IRON, TIBC, TRANSFERRIN, FERRITIN in the last 72 hours. Studies/Results: Dg Chest Port 1 View  Result Date: 12/23/2015 CLINICAL DATA:  Respiratory failure. EXAM: PORTABLE CHEST 1 VIEW COMPARISON:  12/22/2015. FINDINGS: Endotracheal tube, NG tube, left eye J line, large caliber right IJ line in stable position.  Cardiomegaly with mild pulmonary venous congestion and interstitial prominence suggesting mild congestive heart failure. Small left pleural effusion. Chest is unchanged from prior exam. No pneumothorax . IMPRESSION: 1. Lines and tubes in stable position. 2. Persistent changes of mild congestive heart failure with cardiomegaly, mild pulmonary venous congestion, mild interstitial edema and small left pleural effusion. No interim change from prior exam. Electronically Signed   By: Marcello Moores  Register   On: 12/23/2015 06:58   Dg Chest Port 1 View  Result Date: 12/22/2015 CLINICAL DATA:  Respiratory failure.  Assess endotracheal tube. EXAM: PORTABLE CHEST 1 VIEW COMPARISON:  Chest radiograph 12/21/2015 FINDINGS: Support lines and tubes are in unchanged position. The tip of the endotracheal tube remains at the level of the clavicular heads. Cardiomediastinal contours are unchanged. Persistent pulmonary vascular congestion. Small left pleural effusion. IMPRESSION: 1. Unchanged support lines and tubes. 2. Mild pulmonary edema. Electronically Signed   By: Ulyses Jarred M.D.   On: 12/22/2015 06:41   Dg Chest Port 1 View  Result Date: 12/21/2015 CLINICAL DATA:  Status post central line placement. EXAM: PORTABLE CHEST 1 VIEW COMPARISON:  Single-view of the chest earlier today. FINDINGS: Endotracheal tube is again seen with the tip at the top of the clavicular heads, well above the carina. Right IJ approach dialysis catheter remains in place. A new left IJ central venous catheter is seen with the tip projecting over the mid superior vena cava.  No pneumothorax. There is cardiomegaly without edema. IMPRESSION: New left IJ catheter tip projects in the mid superior vena cava. Negative for pneumothorax. No other change. Electronically Signed   By: Inge Rise M.D.   On: 12/21/2015 15:07   Dg Chest Port 1 View  Result Date: 12/21/2015 CLINICAL DATA:  Intubated EXAM: PORTABLE CHEST 1 VIEW COMPARISON:  Chest radiograph  from earlier today. FINDINGS: Endotracheal tube tip is 3.8 cm above the carina. Right internal jugular central venous catheter terminates at the cavoatrial junction. Enteric tube enters stomach with the tip not seen on this image. Stable cardiomediastinal silhouette with cardiomegaly. No pneumothorax. Stable probable trace bilateral pleural effusions. Mild pulmonary edema, not definitely changed. Patchy left lung base opacity appears increased. IMPRESSION: 1. Well-positioned support structures as described. 2. Stable mild congestive heart failure . 3. Increased patchy left lung base opacity, favor atelectasis, cannot exclude a component of pneumonia or aspiration. Electronically Signed   By: Ilona Sorrel M.D.   On: 12/21/2015 11:26    Scheduled: . sodium chloride   Intravenous Once  . sodium chloride   Intravenous Once  . sodium chloride   Intravenous Once  . chlorhexidine  15 mL Mouth Rinse BID  . fentaNYL (SUBLIMAZE) injection  50 mcg Intravenous Once  . hydrocortisone sod succinate (SOLU-CORTEF) inj  50 mg Intravenous Q6H  . insulin aspart  1-3 Units Subcutaneous Q4H  . insulin glargine  15 Units Subcutaneous Q0600  . levothyroxine  50 mcg Intravenous Daily  . mouth rinse  15 mL Mouth Rinse QID  . [START ON 12/24/2015] pantoprazole  40 mg Intravenous Q12H    LOS: 2 days   Jeana Kersting C 12/23/2015,9:10 AM

## 2015-12-23 NOTE — Progress Notes (Signed)
Fentanyl gtt 25 cc wasted in sink and witness by Verdia Kuba BSN MSN

## 2015-12-24 DIAGNOSIS — T8612 Kidney transplant failure: Secondary | ICD-10-CM | POA: Diagnosis not present

## 2015-12-24 DIAGNOSIS — N186 End stage renal disease: Secondary | ICD-10-CM | POA: Diagnosis not present

## 2015-12-24 DIAGNOSIS — Z992 Dependence on renal dialysis: Secondary | ICD-10-CM | POA: Diagnosis not present

## 2015-12-24 LAB — CBC
HCT: 23 % — ABNORMAL LOW (ref 39.0–52.0)
HCT: 22.8 % — ABNORMAL LOW (ref 39.0–52.0)
Hemoglobin: 7.3 g/dL — ABNORMAL LOW (ref 13.0–17.0)
Hemoglobin: 7.1 g/dL — ABNORMAL LOW (ref 13.0–17.0)
MCH: 30.8 pg (ref 26.0–34.0)
MCH: 30.7 pg (ref 26.0–34.0)
MCHC: 31.7 g/dL (ref 30.0–36.0)
MCHC: 31.1 g/dL (ref 30.0–36.0)
MCV: 97 fL (ref 78.0–100.0)
MCV: 98.7 fL (ref 78.0–100.0)
Platelets: 150 10*3/uL (ref 150–400)
Platelets: 159 10*3/uL (ref 150–400)
RBC: 2.37 MIL/uL — ABNORMAL LOW (ref 4.22–5.81)
RBC: 2.31 MIL/uL — ABNORMAL LOW (ref 4.22–5.81)
RDW: 17.3 % — ABNORMAL HIGH (ref 11.5–15.5)
RDW: 17.2 % — ABNORMAL HIGH (ref 11.5–15.5)
WBC: 10 10*3/uL (ref 4.0–10.5)
WBC: 7.8 10*3/uL (ref 4.0–10.5)

## 2015-12-24 LAB — GLUCOSE, CAPILLARY
Glucose-Capillary: 154 mg/dL — ABNORMAL HIGH (ref 65–99)
Glucose-Capillary: 180 mg/dL — ABNORMAL HIGH (ref 65–99)
Glucose-Capillary: 201 mg/dL — ABNORMAL HIGH (ref 65–99)
Glucose-Capillary: 214 mg/dL — ABNORMAL HIGH (ref 65–99)
Glucose-Capillary: 301 mg/dL — ABNORMAL HIGH (ref 65–99)
Glucose-Capillary: 307 mg/dL — ABNORMAL HIGH (ref 65–99)

## 2015-12-24 LAB — POCT I-STAT 3, VENOUS BLOOD GAS (G3P V)
Acid-base deficit: 4 mmol/L — ABNORMAL HIGH (ref 0.0–2.0)
Bicarbonate: 22 mmol/L (ref 20.0–28.0)
O2 Saturation: 69 %
Patient temperature: 97.8
TCO2: 23 mmol/L (ref 0–100)
pCO2, Ven: 44.3 mmHg (ref 44.0–60.0)
pH, Ven: 7.302 (ref 7.250–7.430)
pO2, Ven: 39 mmHg (ref 32.0–45.0)

## 2015-12-24 LAB — RENAL FUNCTION PANEL
Albumin: 2.5 g/dL — ABNORMAL LOW (ref 3.5–5.0)
Albumin: 2.5 g/dL — ABNORMAL LOW (ref 3.5–5.0)
Anion gap: 13 (ref 5–15)
Anion gap: 12 (ref 5–15)
BUN: 50 mg/dL — ABNORMAL HIGH (ref 6–20)
BUN: 60 mg/dL — ABNORMAL HIGH (ref 6–20)
CO2: 24 mmol/L (ref 22–32)
CO2: 24 mmol/L (ref 22–32)
Calcium: 8.1 mg/dL — ABNORMAL LOW (ref 8.9–10.3)
Calcium: 8.2 mg/dL — ABNORMAL LOW (ref 8.9–10.3)
Chloride: 100 mmol/L — ABNORMAL LOW (ref 101–111)
Chloride: 101 mmol/L (ref 101–111)
Creatinine, Ser: 5.46 mg/dL — ABNORMAL HIGH (ref 0.61–1.24)
Creatinine, Ser: 6.55 mg/dL — ABNORMAL HIGH (ref 0.61–1.24)
GFR calc Af Amer: 13 mL/min — ABNORMAL LOW (ref >60)
GFR calc Af Amer: 10 mL/min — ABNORMAL LOW (ref >60)
GFR calc non Af Amer: 11 mL/min — ABNORMAL LOW (ref >60)
GFR calc non Af Amer: 9 mL/min — ABNORMAL LOW (ref >60)
Glucose, Bld: 247 mg/dL — ABNORMAL HIGH (ref 65–99)
Glucose, Bld: 157 mg/dL — ABNORMAL HIGH (ref 65–99)
Phosphorus: 7.2 mg/dL — ABNORMAL HIGH (ref 2.5–4.6)
Phosphorus: 8.1 mg/dL — ABNORMAL HIGH (ref 2.5–4.6)
Potassium: 4.6 mmol/L (ref 3.5–5.1)
Potassium: 4.4 mmol/L (ref 3.5–5.1)
Sodium: 137 mmol/L (ref 135–145)
Sodium: 137 mmol/L (ref 135–145)

## 2015-12-24 LAB — MAGNESIUM: Magnesium: 2 mg/dL (ref 1.7–2.4)

## 2015-12-24 LAB — TSH: TSH: 2.489 u[IU]/mL (ref 0.350–4.500)

## 2015-12-24 MED ORDER — PSEUDOEPHEDRINE HCL ER 120 MG PO TB12
120.0000 mg | ORAL_TABLET | Freq: Two times a day (BID) | ORAL | Status: DC
  Administered 2015-12-24: 120 mg via ORAL
  Filled 2015-12-24: qty 1

## 2015-12-24 MED ORDER — PROMETHAZINE HCL 25 MG PO TABS
12.5000 mg | ORAL_TABLET | Freq: Three times a day (TID) | ORAL | Status: DC | PRN
Start: 2015-12-24 — End: 2016-01-04
  Administered 2016-01-02: 12.5 mg via ORAL
  Filled 2015-12-24: qty 1

## 2015-12-24 MED ORDER — INSULIN ASPART 100 UNIT/ML ~~LOC~~ SOLN
0.0000 [IU] | SUBCUTANEOUS | Status: DC
  Administered 2015-12-24: 11 [IU] via SUBCUTANEOUS
  Administered 2015-12-24: 3 [IU] via SUBCUTANEOUS
  Administered 2015-12-24: 11 [IU] via SUBCUTANEOUS
  Administered 2015-12-24: 5 [IU] via SUBCUTANEOUS
  Administered 2015-12-25: 3 [IU] via SUBCUTANEOUS
  Administered 2015-12-25: 15 [IU] via SUBCUTANEOUS
  Administered 2015-12-25 (×2): 5 [IU] via SUBCUTANEOUS
  Administered 2015-12-26 (×2): 11 [IU] via SUBCUTANEOUS
  Administered 2015-12-26: 5 [IU] via SUBCUTANEOUS
  Administered 2015-12-26: 2 [IU] via SUBCUTANEOUS
  Administered 2015-12-26: 8 [IU] via SUBCUTANEOUS
  Administered 2015-12-27: 15 [IU] via SUBCUTANEOUS
  Administered 2015-12-27: 5 [IU] via SUBCUTANEOUS
  Administered 2015-12-27 (×2): 3 [IU] via SUBCUTANEOUS
  Administered 2015-12-27: 8 [IU] via SUBCUTANEOUS
  Administered 2015-12-27: 15 [IU] via SUBCUTANEOUS
  Administered 2015-12-28: 5 [IU] via SUBCUTANEOUS
  Administered 2015-12-28 (×2): 15 [IU] via SUBCUTANEOUS
  Administered 2015-12-28: 8 [IU] via SUBCUTANEOUS
  Administered 2015-12-28 – 2015-12-29 (×2): 15 [IU] via SUBCUTANEOUS
  Administered 2015-12-29 (×2): 5 [IU] via SUBCUTANEOUS

## 2015-12-24 MED ORDER — HYDROCORTISONE NA SUCCINATE PF 100 MG IJ SOLR
25.0000 mg | Freq: Two times a day (BID) | INTRAMUSCULAR | Status: DC
  Administered 2015-12-24 – 2015-12-26 (×4): 25 mg via INTRAVENOUS
  Filled 2015-12-24: qty 2
  Filled 2015-12-24: qty 0.5
  Filled 2015-12-24: qty 2

## 2015-12-24 MED ORDER — PANTOPRAZOLE SODIUM 40 MG PO TBEC
40.0000 mg | DELAYED_RELEASE_TABLET | Freq: Two times a day (BID) | ORAL | Status: DC
  Administered 2015-12-24 – 2015-12-27 (×7): 40 mg via ORAL
  Filled 2015-12-24 (×7): qty 1

## 2015-12-24 MED ORDER — GABAPENTIN 300 MG PO CAPS
300.0000 mg | ORAL_CAPSULE | Freq: Three times a day (TID) | ORAL | Status: DC
  Administered 2015-12-24 – 2015-12-26 (×6): 300 mg via ORAL
  Filled 2015-12-24 (×7): qty 1

## 2015-12-24 MED ORDER — HYDROMORPHONE HCL 2 MG PO TABS
1.0000 mg | ORAL_TABLET | Freq: Three times a day (TID) | ORAL | Status: DC | PRN
  Administered 2015-12-24 – 2015-12-26 (×4): 1 mg via ORAL
  Filled 2015-12-24 (×5): qty 1

## 2015-12-24 MED ORDER — SODIUM CHLORIDE 0.9% FLUSH
10.0000 mL | Freq: Two times a day (BID) | INTRAVENOUS | Status: DC
  Administered 2015-12-24 – 2015-12-31 (×7): 10 mL

## 2015-12-24 MED ORDER — ROPINIROLE HCL 1 MG PO TABS: 2.0000 mg | ORAL_TABLET | Freq: Every day | ORAL | Status: DC

## 2015-12-24 MED ORDER — LORATADINE 10 MG PO TABS
5.0000 mg | ORAL_TABLET | Freq: Two times a day (BID) | ORAL | Status: DC
  Administered 2015-12-24 – 2016-01-04 (×20): 5 mg via ORAL
  Filled 2015-12-24 (×11): qty 1
  Filled 2015-12-24: qty 0.5
  Filled 2015-12-24 (×11): qty 1

## 2015-12-24 MED ORDER — ONDANSETRON HCL 4 MG/2ML IJ SOLN
4.0000 mg | Freq: Four times a day (QID) | INTRAMUSCULAR | Status: DC | PRN
  Administered 2015-12-24 – 2015-12-26 (×2): 4 mg via INTRAVENOUS
  Filled 2015-12-24 (×2): qty 2

## 2015-12-24 MED ORDER — PROMETHAZINE HCL 25 MG/ML IJ SOLN
12.5000 mg | Freq: Three times a day (TID) | INTRAMUSCULAR | Status: DC | PRN
  Administered 2015-12-24 – 2015-12-25 (×3): 12.5 mg via INTRAVENOUS
  Filled 2015-12-24 (×3): qty 1

## 2015-12-24 MED ORDER — LORATADINE-PSEUDOEPHEDRINE ER 5-120 MG PO TB12: 1.0000 | ORAL_TABLET | Freq: Two times a day (BID) | ORAL | Status: DC

## 2015-12-24 MED ORDER — ORAL CARE MOUTH RINSE
15.0000 mL | Freq: Two times a day (BID) | OROMUCOSAL | Status: DC
Start: 2015-12-24 — End: 2015-12-30
  Administered 2015-12-24 – 2015-12-30 (×10): 15 mL via OROMUCOSAL

## 2015-12-24 MED ORDER — PHENOL 1.4 % MT LIQD
1.0000 | OROMUCOSAL | Status: DC | PRN
  Administered 2015-12-24: 1 via OROMUCOSAL
  Filled 2015-12-24: qty 177

## 2015-12-24 MED ORDER — SODIUM CHLORIDE 0.9% FLUSH: 10.0000 mL | INTRAVENOUS | Status: DC | PRN

## 2015-12-24 MED ORDER — PANTOPRAZOLE SODIUM 40 MG IV SOLR: 40.0000 mg | Freq: Two times a day (BID) | INTRAVENOUS | Status: DC

## 2015-12-24 MED ORDER — HYDROMORPHONE HCL 1 MG/ML IJ SOLN: 1.0000 mg | Freq: Three times a day (TID) | INTRAMUSCULAR | Status: DC | PRN

## 2015-12-24 MED ORDER — LEVOTHYROXINE SODIUM 100 MCG PO TABS
100.0000 ug | ORAL_TABLET | Freq: Every day | ORAL | Status: DC
  Administered 2015-12-25 – 2016-01-04 (×10): 100 ug via ORAL
  Filled 2015-12-24 (×10): qty 1

## 2015-12-24 MED ORDER — PSEUDOEPHEDRINE HCL ER 120 MG PO TB12
120.0000 mg | ORAL_TABLET | Freq: Two times a day (BID) | ORAL | Status: DC
  Administered 2015-12-25: 120 mg via ORAL
  Filled 2015-12-24 (×4): qty 1

## 2015-12-24 MED ORDER — LORATADINE 10 MG PO TABS
5.0000 mg | ORAL_TABLET | Freq: Every day | ORAL | Status: DC
  Administered 2015-12-24: 5 mg via ORAL
  Filled 2015-12-24: qty 0.5

## 2015-12-24 MED ORDER — ROPINIROLE HCL 1 MG PO TABS
2.0000 mg | ORAL_TABLET | Freq: Every day | ORAL | Status: DC
  Administered 2015-12-24 – 2016-01-04 (×12): 2 mg via ORAL
  Filled 2015-12-24 (×13): qty 2

## 2015-12-24 NOTE — Consult Note (Signed)
PULMONARY / CRITICAL CARE MEDICINE   Name: Dale [name] MRN: [id number] DOB: Dec 09, 1965    ADMISSION DATE:  12/21/2015 CONSULTATION DATE:  12/21/15  REFERRING MD:  Florene Glen  CHIEF COMPLAINT:  Chest Pain  HISTORY OF PRESENT ILLNESS:  Pt is encephelopathic; therefore, this HPI is obtained from chart review. Dale [name] is a 50 y.o. male with PMH as outlined below including ESRD (M/W/F).  Per nephrology service, pt is extremely compliant with HD never missing any sessions.  On 8/27 he developed L arm pain which he initially thought was due to his "fistula not working".  Pain resolved spontaneously therefore he did feel that he needed to be evaluated.  The following morning 8/28, when he woke up to go to HD, pain was back and was worse.  He therefore went to ED for further evaluation.  In ED, he was found to have K 7, SCr 12.37, BUN 219, Hgb 6.9.  He was taken for urgent HD and after roughly 1 hour, he began to have diaphoresis, persistent L arm pain which radiated into chest, SOB, worsening anxiety.   PCCM was called and pt was emergently intubated while in HD unit.  He was transferred to ICU for further evaluation / management.   SUBJECTIVE: 8/30 extubated Some Co2 retention HD done in evening  VITAL SIGNS: BP (!) 107/38   Pulse (!) 103   Temp 97.8 F (36.6 C) (Oral)   Resp (!) 22   Ht '5\' 8"'  (1.727 m)   Wt 134.1 kg (295 lb 10.2 oz)   SpO2 99%   BMI 44.95 kg/m   HEMODYNAMICS:    VENTILATOR SETTINGS:    INTAKE / OUTPUT: I/O last 3 completed shifts: In: 1868.7 [I.V.:1608.7; Other:260] Out: 2325 [Emesis/NG output:325; Other:2000]   PHYSICAL EXAMINATION: General: Adult male, no distress Neuro: follows commands, fully alert, int restlessness , NONfocal HEENT: PERRL, line wnl Cardiovascular: s1 s2 RRR, no M/R/G Lungs: reduced Abdomen: BS wnl obese, soft, NT/ND.  Musculoskeletal: L BKA, edema left gen Skin: warm, no rashes.  LABS:  BMET  Recent Labs Lab  12/23/15 1830 12/23/15 2021 12/24/15 0420  NA 137 137 137  K 6.1* 4.9 4.6  CL 106 102 100*  CO2 '22 24 24  ' BUN 95* 66* 50*  CREATININE 7.65* 5.44* 5.46*  GLUCOSE 294* 224* 247*    Electrolytes  Recent Labs Lab 12/22/15 0516  12/23/15 0615  12/23/15 1830 12/23/15 2021 12/24/15 0420  CALCIUM 7.7*  < > 8.1*  < > 8.2* 8.4* 8.1*  MG 2.2  --  2.3  --   --   --  2.0  PHOS 4.4  < >  --   < > 10.2* 6.7* 7.2*  < > = values in this interval not displayed.  CBC  Recent Labs Lab 12/23/15 1830 12/23/15 2020 12/24/15 0420  WBC 12.8* 13.1* 10.0  HGB 7.8* 8.1* 7.3*  HCT 24.8* 25.3* 23.0*  PLT 154 157 150    Coag's  Recent Labs Lab 12/21/15 1506  INR 1.20    Sepsis Markers  Recent Labs Lab 12/21/15 1125  LATICACIDVEN 3.0*    ABG  Recent Labs Lab 12/21/15 1130 12/22/15 0445 12/23/15 1624  PHART 7.338* 7.359 7.275*  PCO2ART 29.4* 36.6 47.5  PO2ART 462* 162* 120.0*    Liver Enzymes  Recent Labs Lab 12/21/15 0640  12/23/15 0615  12/23/15 1830 12/23/15 2021 12/24/15 0420  AST 16  --  39  --   --   --   --  ALT 14*  --  25  --   --   --   --   ALKPHOS 47  --  45  --   --   --   --   BILITOT 0.4  --  0.3  --   --   --   --   ALBUMIN 2.9*  < > 2.4*  < > 2.7* 2.8* 2.5*  < > = values in this interval not displayed.  Cardiac Enzymes  Recent Labs Lab 12/21/15 1949 12/22/15 0840 12/23/15 0615  TROPONINI 7.94* 18.04* 5.43*    Glucose  Recent Labs Lab 12/23/15 1115 12/23/15 1551 12/23/15 2014 12/23/15 2350 12/24/15 0338 12/24/15 0816  GLUCAP 226* 257* 231* 200* 214* 301*    Imaging No results found.   STUDIES:  CXR 8/28 > cardiomegaly Echo 8/30>>>EF WNL, grade 2 diast  CULTURES: None.  ANTIBIOTICS: None.  SIGNIFICANT EVENTS: 8/28 > admitted with hyperkalemia, probable GI bleed, near arrest--> on HD ETT, shock 8/30 extubated, mild hypercapnia, HD done  LINES/TUBES: ETT 8/28 >>>8/30 Chronic rt IJ HD Left IJ 8/28 cvl>>>(need  access)  ASSESSMENT / PLAN:  HEMATOLOGIC A:   Anemia - suspect hemorrhagic due to GI bleed VTE Prophylaxis. P:  PPI infusion to bid now scd Cbc am  RENAL A:   ESRD (M/W/F). Hyperkalemia - s/p emergent HD 8/28. Uremia - concern due to GI bleed. Hyponatremia. AGMA - uremia. P:   Hd per renal Seems euvolemic now  PULMONARY A: Acute hypoxic respiratory failure. OSA with probable OHS. Does not use BIPA / cpap , refuses / does not otlerate Mild edema P:   HD appreciated 'abg now Re discuss BIPAP, cant get nasal prongs  CARDIOVASCULAR A:  Hypotension - resolved BP low at baseline Troponin leak with normal EF on echo Hx HTN, HLD, CAD, dCHF (Echo Nov 2016 with EF 60-65%, G2DD). Rel AI P:  reduce stress roids, faster with GI bleed Map goal 55, on own met Echo reassuring, NOT impressed for nstemi, demand / trop leak likely  Will need risk stratification from cards pre dc Keep hgb 7 with trop noted but ehco neg  Unable to use asa or anticoaguatation Cbc in pm   GASTROINTESTINAL A:   Concern for active GI bleed. Nutrition. Hx GERD. P:   PPI infusion to bid Would cancel EGD with pm resp status and this am - concern worsen resp status with sedation of any kind at this stage fulls  INFECTIOUS A:   No indication of infection. P:   Monitor clinically / fever curve  ENDOCRINE A:   DM Hypothyroidism. P:   SSI, esnure moderate  Continue outpatient synthroid, to home oral Re assess tsh - wnl lantus to 25, SSI  NEUROLOGIC A:   Acute encephalopathy improved Hx fibromyalgia. narc dep P:   Start gabapentin Dilaudid lower dose prn added Start phenergan  Start ropinirole.  Family updated: I updated wife and pt  Interdisciplinary Family Meeting v Palliative Care Meeting:  Due by: 12/27/15.   To triad, sdu  Lavon Paganini. Titus Mould, MD, [place] Pgr: [facility] 12/24/2015 9:01 AM

## 2015-12-24 NOTE — Progress Notes (Addendum)
Assessment/Plan: 1. Acute GI Bleed:s/p PRBCs 2. Chest pain: per CCM 3. Hyperkalemia: improved 4. ESRD- MWF-[facility]. Friday to get back on schedule. AVF working in [place] 5. ChronicHypotension-on midodrine pre HD 6. Anemia-  7. S/p VDRF    Subjective: Interval History: Had HD last PM with 2 liters removed  Objective: Vital signs in last 24 hours: Temp:  [97.5 F (36.4 [name])-98.2 F (36.8 [name])] 97.8 F (36.6 [name]) (08/31 0823) Pulse Rate:  [90-108] 94 (08/31 0900) Resp:  [9-25] 16 (08/31 0900) BP: (79-146)/(30-73) 146/62 (08/31 0900) SpO2:  [89 %-100 %] 100 % (08/31 0900) Weight:  [134 kg (295 lb 6.7 oz)-136 kg (299 lb 13.2 oz)] 134.1 kg (295 lb 10.2 oz) (08/31 0500) Weight change: -2.256 kg (-4 lb 15.6 oz)  Intake/Output from previous day: 08/30 0701 - 08/31 0700 In: 1034.6 [I.V.:774.6] Out: 2050 [Emesis/NG output:50] Intake/Output this shift: No intake/output data recorded.  General appearance: alert and cooperative Resp: clear to auscultation bilaterally Chest wall: no tenderness Cardio: regular rate and rhythm, S1, S2 normal, no murmur, click, rub or gallop Extremities: RLE tr to 1+, left BKA, LUE AVF with good thrill and bruit  Lab Results:  Recent Labs  12/23/15 2020 12/24/15 0420  WBC 13.1* 10.0  HGB 8.1* 7.3*  HCT 25.3* 23.0*  PLT 157 150   BMET:  Recent Labs  12/23/15 2021 12/24/15 0420  NA 137 137  K 4.9 4.6  CL 102 100*  CO2 24 24  GLUCOSE 224* 247*  BUN 66* 50*  CREATININE 5.44* 5.46*  CALCIUM 8.4* 8.1*   No results for input(s): PTH in the last 72 hours. Iron Studies: No results for input(s): IRON, TIBC, TRANSFERRIN, FERRITIN in the last 72 hours. Studies/Results: Dg Chest Port 1 View  Result Date: 12/23/2015 CLINICAL DATA:  Respiratory failure. EXAM: PORTABLE CHEST 1 VIEW COMPARISON:  12/22/2015. FINDINGS: Endotracheal tube, NG tube, left eye J line, large caliber right IJ line in stable position. Cardiomegaly with mild pulmonary  venous congestion and interstitial prominence suggesting mild congestive heart failure. Small left pleural effusion. Chest is unchanged from prior exam. No pneumothorax . IMPRESSION: 1. Lines and tubes in stable position. 2. Persistent changes of mild congestive heart failure with cardiomegaly, mild pulmonary venous congestion, mild interstitial edema and small left pleural effusion. No interim change from prior exam. Electronically Signed   By: Marcello Moores  Register   On: 12/23/2015 06:58    Scheduled: . fentaNYL (SUBLIMAZE) injection  50 mcg Intravenous Once  . gabapentin  300 mg Oral TID  . hydrocortisone sod succinate (SOLU-CORTEF) inj  25 mg Intravenous Q12H  . insulin aspart  0-15 Units Subcutaneous Q4H  . insulin glargine  15 Units Subcutaneous Q0600  . [START ON 12/25/2015] levothyroxine  100 mcg Oral QAC breakfast  . loratadine  5 mg Oral Daily  . mouth rinse  15 mL Mouth Rinse BID  . pantoprazole  40 mg Oral BID  . pseudoephedrine  120 mg Oral BID  . rOPINIRole  2 mg Oral Q supper    LOS: 3 days   Dale [name] 12/24/2015,10:47 AM

## 2015-12-24 NOTE — Care Management Important Message (Signed)
Important Message  Patient Details  Name: Dale [name] MRN: [id number] Date of Birth: Jul 29, 1965   Medicare Important Message Given:  Yes    Nathen May 12/24/2015, 10:46 AM

## 2015-12-24 NOTE — Progress Notes (Signed)
Daily Rounding Note  12/24/2015, 9:40 AM  LOS: 3 days   SUBJECTIVE:   Chief complaint: shortness or breath and confusion  Pt remains extubated.  Mental status remains improved. Breathing ok.  On full liquids.   OBJECTIVE:         Vital signs in last 24 hours:    Temp:  [97.5 F (36.4 C)-98.2 F (36.8 C)] 97.8 F (36.6 C) (08/31 0823) Pulse Rate:  [90-108] 103 (08/31 0800) Resp:  [9-25] 22 (08/31 0800) BP: (79-142)/(30-75) 107/38 (08/31 0800) SpO2:  [89 %-100 %] 99 % (08/31 0800) Weight:  [134 kg (295 lb 6.7 oz)-136 kg (299 lb 13.2 oz)] 134.1 kg (295 lb 10.2 oz) (08/31 0500) Last BM Date: 12/23/15 Filed Weights   12/23/15 1900 12/23/15 2200 12/24/15 0500  Weight: 136 kg (299 lb 13.2 oz) 134 kg (295 lb 6.7 oz) 134.1 kg (295 lb 10.2 oz)   General: obese, pale but looks better.     Heart: RRR Chest: clear bil.  Some dyspnea, but improved Abdomen: obese, NT, active BS  Extremities: no CCE.   Neuro/Psych:  Oriented x 3.  Moves all 4 limbs.    Intake/Output from previous day: 08/30 0701 - 08/31 0700 In: 1034.6 [I.V.:774.6] Out: 2050 [Emesis/NG output:50]  Intake/Output this shift: No intake/output data recorded.  Lab Results:  Recent Labs  12/23/15 1830 12/23/15 2020 12/24/15 0420  WBC 12.8* 13.1* 10.0  HGB 7.8* 8.1* 7.3*  HCT 24.8* 25.3* 23.0*  PLT 154 157 150   BMET  Recent Labs  12/23/15 1830 12/23/15 2021 12/24/15 0420  NA 137 137 137  K 6.1* 4.9 4.6  CL 106 102 100*  CO2 22 24 24   GLUCOSE 294* 224* 247*  BUN 95* 66* 50*  CREATININE 7.65* 5.44* 5.46*  CALCIUM 8.2* 8.4* 8.1*   LFT  Recent Labs  12/23/15 0615  12/23/15 1830 12/23/15 2021 12/24/15 0420  PROT 5.1*  --   --   --   --   ALBUMIN 2.4*  < > 2.7* 2.8* 2.5*  AST 39  --   --   --   --   ALT 25  --   --   --   --   ALKPHOS 45  --   --   --   --   BILITOT 0.3  --   --   --   --   < > = values in this interval not  displayed. PT/INR  Recent Labs  12/21/15 1506  LABPROT 15.3*  INR 1.20   Hepatitis Panel No results for input(s): HEPBSAG, HCVAB, HEPAIGM, HEPBIGM in the last 72 hours.  Studies/Results: Dg Chest Port 1 View  Result Date: 12/23/2015 CLINICAL DATA:  Respiratory failure. EXAM: PORTABLE CHEST 1 VIEW COMPARISON:  12/22/2015. FINDINGS: Endotracheal tube, NG tube, left eye J line, large caliber right IJ line in stable position. Cardiomegaly with mild pulmonary venous congestion and interstitial prominence suggesting mild congestive heart failure. Small left pleural effusion. Chest is unchanged from prior exam. No pneumothorax . IMPRESSION: 1. Lines and tubes in stable position. 2. Persistent changes of mild congestive heart failure with cardiomegaly, mild pulmonary venous congestion, mild interstitial edema and small left pleural effusion. No interim change from prior exam. Electronically Signed   By: Marcello Moores  Register   On: 12/23/2015 06:58    ASSESMENT:   *  Acute anemia.  FOBT +, resolved black NGT output, no melena, BPR.  S/p PRBC  x 4.  Hgb down today. Protonix gtt completed and on IV BID PPI.   *   ESRD.  HD normally MWF, but will dialyze today.  CRRT discontinued 8/30.    *   Elevated Troponins, CCM suspects demand ischemia due to anemia.  Possible non STEMI, ACS.   *  Encephalopathy.  Improved.     PLAN   *  At Dr Janit Pagan request/suggestion, the EGD is cancelled due to high risk of resp failure and reintubation due to sedation.  Tentatively have slot for EGD with MAC tomorrow at  0900.    *  Switch PPI to Protonix 40 mg po bid.  Advance to ESRD, carb mod diet in future.  NPO post midnight for possible EGD.  *  Follow Hgb, 1800 today and 0500 tomorrow.        Dale [name]  12/24/2015, 9:40 AM Pager: [phone]

## 2015-12-24 NOTE — Care Management Note (Addendum)
Case Management Note  Patient Details  Name: Dale [name] MRN: [id number] Date of Birth: 07-30-1965  Subjective/Objective:      Pt admitted with arm pain and chest pain/acute on chronic renal failure   - pt is intubated on CRRT           Action/Plan:  Pt is from home with wife.  CM will continue to follow    Expected Discharge Date:                  Expected Discharge Plan:     In-House Referral:     Discharge planning Services  CM Consult  Post Acute Care Choice:    Choice offered to:     DME Arranged:    DME Agency:     HH Arranged:    HH Agency:     Status of Service:  In process, will continue to follow  If discussed at Long Length of Stay Meetings, dates discussed:    Additional Comments: 12/24/2015 Pt extubated yesterday and CRRT has stopped.   Maryclare Labrador, RN 12/24/2015, 9:21 AM

## 2015-12-24 NOTE — Progress Notes (Signed)
PT Cancellation Note  Patient Details Name: Dale [name] MRN: [id number] DOB: 03-Sep-1965   Cancelled Treatment:    Reason Eval/Treat Not Completed: Patient declined, no reason specified (pt reports nausea, and no desire to mobilize today). Pt educated for purpose of eval and benefit but firmly declined participation today. Will attempt next date.   [name] [name] 12/24/2015, 11:03 AM [name], [name]

## 2015-12-25 ENCOUNTER — Encounter (HOSPITAL_COMMUNITY)
Admission: EM | Disposition: A | Discharge status: Home Health | Payer: Self-pay | Source: Home / Self Care | Attending: Internal Medicine

## 2015-12-25 ENCOUNTER — Encounter (HOSPITAL_COMMUNITY): Payer: Self-pay | Admitting: *Deleted

## 2015-12-25 ENCOUNTER — Inpatient Hospital Stay (HOSPITAL_COMMUNITY): Payer: Medicare Other | Admitting: Anesthesiology

## 2015-12-25 HISTORY — PX: ESOPHAGOGASTRODUODENOSCOPY: SHX5428

## 2015-12-25 LAB — CBC WITH DIFFERENTIAL/PLATELET
Basophils Absolute: 0 10*3/uL (ref 0.0–0.1)
Basophils Relative: 0 %
Eosinophils Absolute: 0.3 10*3/uL (ref 0.0–0.7)
Eosinophils Relative: 4 %
HCT: 21.4 % — ABNORMAL LOW (ref 39.0–52.0)
Hemoglobin: 6.7 g/dL — CL (ref 13.0–17.0)
Lymphocytes Relative: 16 %
Lymphs Abs: 1 10*3/uL (ref 0.7–4.0)
MCH: 31.9 pg (ref 26.0–34.0)
MCHC: 32.2 g/dL (ref 30.0–36.0)
MCV: 99.1 fL (ref 78.0–100.0)
Monocytes Absolute: 0.4 10*3/uL (ref 0.1–1.0)
Monocytes Relative: 6 %
Neutro Abs: 4.6 10*3/uL (ref 1.7–7.7)
Neutrophils Relative %: 74 %
Platelets: 160 10*3/uL (ref 150–400)
RBC: 2.16 MIL/uL — ABNORMAL LOW (ref 4.22–5.81)
RDW: 17.3 % — ABNORMAL HIGH (ref 11.5–15.5)
WBC: 6.2 10*3/uL (ref 4.0–10.5)

## 2015-12-25 LAB — RENAL FUNCTION PANEL
Albumin: 2.4 g/dL — ABNORMAL LOW (ref 3.5–5.0)
Albumin: 2.5 g/dL — ABNORMAL LOW (ref 3.5–5.0)
Anion gap: 15 (ref 5–15)
Anion gap: 12 (ref 5–15)
BUN: 76 mg/dL — ABNORMAL HIGH (ref 6–20)
BUN: 25 mg/dL — ABNORMAL HIGH (ref 6–20)
CO2: 24 mmol/L (ref 22–32)
CO2: 25 mmol/L (ref 22–32)
Calcium: 8.4 mg/dL — ABNORMAL LOW (ref 8.9–10.3)
Calcium: 8.2 mg/dL — ABNORMAL LOW (ref 8.9–10.3)
Chloride: 97 mmol/L — ABNORMAL LOW (ref 101–111)
Chloride: 99 mmol/L — ABNORMAL LOW (ref 101–111)
Creatinine, Ser: 7.87 mg/dL — ABNORMAL HIGH (ref 0.61–1.24)
Creatinine, Ser: 3.54 mg/dL — ABNORMAL HIGH (ref 0.61–1.24)
GFR calc Af Amer: 8 mL/min — ABNORMAL LOW (ref >60)
GFR calc Af Amer: 22 mL/min — ABNORMAL LOW (ref >60)
GFR calc non Af Amer: 7 mL/min — ABNORMAL LOW (ref >60)
GFR calc non Af Amer: 19 mL/min — ABNORMAL LOW (ref >60)
Glucose, Bld: 275 mg/dL — ABNORMAL HIGH (ref 65–99)
Glucose, Bld: 190 mg/dL — ABNORMAL HIGH (ref 65–99)
Phosphorus: 9.4 mg/dL — ABNORMAL HIGH (ref 2.5–4.6)
Phosphorus: 4.2 mg/dL (ref 2.5–4.6)
Potassium: 4.6 mmol/L (ref 3.5–5.1)
Potassium: 3.9 mmol/L (ref 3.5–5.1)
Sodium: 136 mmol/L (ref 135–145)
Sodium: 136 mmol/L (ref 135–145)

## 2015-12-25 LAB — MAGNESIUM: Magnesium: 2.5 mg/dL — ABNORMAL HIGH (ref 1.7–2.4)

## 2015-12-25 LAB — GLUCOSE, CAPILLARY
Glucose-Capillary: 234 mg/dL — ABNORMAL HIGH (ref 65–99)
Glucose-Capillary: 245 mg/dL — ABNORMAL HIGH (ref 65–99)
Glucose-Capillary: 177 mg/dL — ABNORMAL HIGH (ref 65–99)
Glucose-Capillary: 368 mg/dL — ABNORMAL HIGH (ref 65–99)

## 2015-12-25 LAB — PREPARE RBC (CROSSMATCH)

## 2015-12-25 SURGERY — EGD (ESOPHAGOGASTRODUODENOSCOPY)
Anesthesia: Monitor Anesthesia Care

## 2015-12-25 MED ORDER — SODIUM CHLORIDE 0.9 % IV SOLN: 100.0000 mL | INTRAVENOUS | Status: DC | PRN

## 2015-12-25 MED ORDER — HEPARIN SODIUM (PORCINE) 1000 UNIT/ML DIALYSIS: 1000.0000 [IU] | INTRAMUSCULAR | Status: DC | PRN

## 2015-12-25 MED ORDER — LIDOCAINE HCL (PF) 1 % IJ SOLN: 5.0000 mL | INTRAMUSCULAR | Status: DC | PRN

## 2015-12-25 MED ORDER — PROPOFOL 500 MG/50ML IV EMUL
INTRAVENOUS | Status: DC | PRN
  Administered 2015-12-25: 50 ug/kg/min via INTRAVENOUS

## 2015-12-25 MED ORDER — PHENYLEPHRINE HCL 10 MG/ML IJ SOLN
INTRAVENOUS | Status: DC | PRN
  Administered 2015-12-25: 10 ug/min via INTRAVENOUS

## 2015-12-25 MED ORDER — ALTEPLASE 2 MG IJ SOLR
2.0000 mg | Freq: Once | INTRAMUSCULAR | Status: DC | PRN
Start: 2015-12-25 — End: 2015-12-25

## 2015-12-25 MED ORDER — INSULIN GLARGINE 100 UNIT/ML ~~LOC~~ SOLN
15.0000 [IU] | Freq: Every day | SUBCUTANEOUS | Status: DC
  Administered 2015-12-25 – 2015-12-26 (×2): 15 [IU] via SUBCUTANEOUS
  Administered 2015-12-27: 7.5 [IU] via SUBCUTANEOUS
  Filled 2015-12-25 (×4): qty 0.15

## 2015-12-25 MED ORDER — LIDOCAINE-PRILOCAINE 2.5-2.5 % EX CREA
1.0000 | TOPICAL_CREAM | CUTANEOUS | Status: DC | PRN
Start: 2015-12-25 — End: 2015-12-25

## 2015-12-25 MED ORDER — SODIUM CHLORIDE 0.9 % IV SOLN
Freq: Once | INTRAVENOUS | Status: AC
  Administered 2015-12-25: 12:00:00 via INTRAVENOUS

## 2015-12-25 MED ORDER — BUTAMBEN-TETRACAINE-BENZOCAINE 2-2-14 % EX AERO
INHALATION_SPRAY | CUTANEOUS | Status: DC | PRN
  Administered 2015-12-25: 2 via TOPICAL

## 2015-12-25 MED ORDER — PENTAFLUOROPROP-TETRAFLUOROETH EX AERO
1.0000 | INHALATION_SPRAY | CUTANEOUS | Status: DC | PRN
Start: 2015-12-25 — End: 2015-12-25

## 2015-12-25 MED ORDER — PENTAFLUOROPROP-TETRAFLUOROETH EX AERO: 1.0000 "application " | INHALATION_SPRAY | CUTANEOUS | Status: DC | PRN

## 2015-12-25 MED ORDER — HEPARIN SODIUM (PORCINE) 1000 UNIT/ML DIALYSIS: 20.0000 [IU]/kg | INTRAMUSCULAR | Status: DC | PRN

## 2015-12-25 MED ORDER — SODIUM CHLORIDE 0.9 % IV SOLN
INTRAVENOUS | Status: DC | PRN
  Administered 2015-12-25: 09:00:00 via INTRAVENOUS

## 2015-12-25 MED ORDER — LIDOCAINE-PRILOCAINE 2.5-2.5 % EX CREA: 1.0000 "application " | TOPICAL_CREAM | CUTANEOUS | Status: DC | PRN

## 2015-12-25 MED ORDER — ALTEPLASE 2 MG IJ SOLR: 2.0000 mg | Freq: Once | INTRAMUSCULAR | Status: DC | PRN

## 2015-12-25 MED ORDER — SODIUM CHLORIDE 0.9 % IV SOLN
INTRAVENOUS | Status: DC
  Administered 2015-12-25: 05:00:00 via INTRAVENOUS

## 2015-12-25 NOTE — Consult Note (Signed)
CARDIOLOGY CONSULT NOTE  Patient ID: Dale [name] MRN: [id number] DOB/AGE: 1966-04-02 49 y.o.  Admit date: 12/21/2015 Primary Physician ASRES,ALEHEGN, MD Primary Cardiologist Dr. [name] Chief Complaint  Left arm pain.  Requesting  Dr. Frederic Jericho  HPI:  The patient has a complicated past cardiac history and medical history.  He is very pleasant and has had diabetes since age 25.  He has not seen Dr. [name] in awhile.  He had left arm pain on 8/27.  He has worsening pain on 8/28.  He went to the D and had a potassium of 7 with Hbg of 6.9.  EKG was non acute.  He had urgent dialysis but had continued left arm and then chest pain.   The patient became increasingly more agitated on dialysis with tachypnea, confusion, diaphoresis.  He became less responsive and had hypotension and was intubated.   He was transfused now a total of 4 units.  GI was consulted.   EGD was done today and there is no evidence for active upper GI bleed.    Hgb is still down at 6.7.  Troponin did peak at 18.04.    Echo EF was 60% with normal wall motion.   He is fuzzy on the events although he does recall having the arm pain and thinking it was related to his fistula.  He was not having any chest pain.   There has been no new shortness of breath, PND or orthopnea. There have been no reported palpitations, presyncope or syncope.  Past Medical History:  Diagnosis Date  . Anemia of chronic disease   . Arthritis    "hands, right knee" (03/19/2014)  . CAD (coronary artery disease)    a. Lexiscan Myoview (11/15):  Inf, apical cap and apical lateral ischemia, EF 56%, inf HK, Moderate Risk;  b. LHC (11/15):  mid to dist Dx 80%, mid RCA 99% (functional CTO), dist RCA 80% >> PCI: balloon angioplasty to mid RCA (could not deliver stent) - plan staged PCI with HSRA of RCA; Dx to be tx medically >> PCI: Rotoblator atherectomy/DES to [facility]  . Cholecystitis    a. 08/27/2011  . Chronic diastolic CHF (congestive heart failure) ([place])    a. Echo 3/13:  EF 55-60%;  b. Echo (11/15):  Mild LVH, EF 60-65%, no RWMA, Gr 1 DD, MAC, mild LAE, normal RVF, mild RAE;  c.  Echo 5/16:  severe LVH, EF 55-60%, no RWMA, Gr 1 DD, MAC, mild LAE  . Claustrophobia    when things get around his face.   . Constipation   . Constipation   . Difficult intubation    only once in 2015 at [facility] haven't had a problem since then  . ESRD (end stage renal disease) ([place])    a. 1995 s/p cadaveric transplant w/ susbequent failure after 18 yrs;  b.Dialysis initiated 07/2011 [facility] M-W-F  . Fibromyalgia   . GERD (gastroesophageal reflux disease)   . Gout    PMH  . History of blood transfusion   . HLD (hyperlipidemia)   . Hypertension   . Hypothyroidism   . Inguinal lymphadenopathy    a. bilateral - s/p biopsy 07/2011  . Insulin dependent diabetes mellitus (HCC)    Type I  . OSA (obstructive sleep apnea)    adjustable bed  . Pericardial effusion    a.  Small by CT 08/22/11;  b.  Large by CT 08/27/11  . Pneumonia ~ 2007  . Shortness of breath dyspnea  too much Fluid    Past Surgical History:  Procedure Laterality Date  . AMPUTATION OF REPLICATED TOES Right   . ANGIOPLASTY  04/09/2014   RCA   . APPENDECTOMY  ~ 2006  . ARTERIOVENOUS GRAFT PLACEMENT Left 1993?   forearm  . ARTERIOVENOUS GRAFT PLACEMENT Right 1993?   leg  . ARTERIOVENOUS GRAFT PLACEMENT Left    Thigh  . Arteriovenous Graft Removed Left    Thigh  . AV FISTULA PLACEMENT  07/29/2011   Procedure: ARTERIOVENOUS (AV) FISTULA CREATION;  Surgeon: Mal Misty, MD;  Location: [place];  Service: Vascular;  Laterality: Right;  Brachial cephalic  . AV FISTULA PLACEMENT Left 01/20/2015   Procedure: LIGATION OF RIGHT ARTERIOVENOUS FISTULA WITH EXCISION OF ANEURYSM;  Surgeon: Angelia Mould, MD;  Location: [place];  Service: Vascular;  Laterality: Left;  . AV FISTULA PLACEMENT Left 04/30/2015   Procedure: CREATION OF LEFT ARM BRACHIO-CEPHALIC ARTERIOVENOUS (AV) FISTULA ;  Surgeon:  Angelia Mould, MD;  Location: [place];  Service: Vascular;  Laterality: Left;  . AV Graft PLACEMENT Left 1993?   "attempted one in my wrist; didn't take"  . BELOW KNEE LEG AMPUTATION Left 2010  . BELOW KNEE LEG AMPUTATION Left   . CARDIAC CATHETERIZATION  03/19/2014  . CARDIAC CATHETERIZATION  03/19/2014   Procedure: CORONARY BALLOON ANGIOPLASTY;  Surgeon: Burnell Blanks, MD;  Location: [facility] CATH LAB;  Service: Cardiovascular;;  . CARPAL TUNNEL RELEASE Bilateral after 2005  . CARPAL TUNNEL RELEASE Bilateral   . CATARACT EXTRACTION W/ INTRAOCULAR LENS  IMPLANT, BILATERAL Bilateral 1990's  . COLONOSCOPY  08/29/2011   Procedure: COLONOSCOPY;  Surgeon: Jerene Bears, MD;  Location: [place];  Service: Gastroenterology;  Laterality: N/A;  . CORONARY ANGIOPLASTY WITH STENT PLACEMENT  04/09/2014       ptca/des mid lad   . EYE SURGERY Bilateral    cataract  . FEMORAL ARTERY REPAIR  04/09/2014   ANGIOSEAL  . INSERTION OF DIALYSIS CATHETER  08/01/2011   Procedure: INSERTION OF DIALYSIS CATHETER;  Surgeon: Rosetta Posner, MD;  Location: [name];  Service: Vascular;  Laterality: Right;  insertion of dialysis catheter on right internal jugular vein  . INSERTION OF DIALYSIS CATHETER Right 01/20/2015   Procedure: INSERTION OF DIALYSIS CATHETER - RIGHT INTERNAL JUGULAR ;  Surgeon: Angelia Mould, MD;  Location: [place];  Service: Vascular;  Laterality: Right;  . KIDNEY TRANSPLANT  04/25/1993  . LEFT HEART CATHETERIZATION WITH CORONARY ANGIOGRAM N/A 03/19/2014   Procedure: LEFT HEART CATHETERIZATION WITH CORONARY ANGIOGRAM;  Surgeon: Burnell Blanks, MD;  Location: [facility] CATH LAB;  Service: Cardiovascular;  Laterality: N/A;  . LYMPH NODE BIOPSY  08/10/2011   Procedure: LYMPH NODE BIOPSY;  Surgeon: Merrie Roof, MD;  Location: [place];  Service: General;  Laterality: Left;  left groin lymph biopsy  . NEUROPLASTY / TRANSPOSITION ULNAR NERVE AT ELBOW Left after 2005  . PERCUTANEOUS CORONARY  ROTOBLATOR INTERVENTION (PCI-R) N/A 04/09/2014   Procedure: PERCUTANEOUS CORONARY ROTOBLATOR INTERVENTION (PCI-R);  Surgeon: Burnell Blanks, MD;  Location: [facility] CATH LAB;  Service: Cardiovascular;  Laterality: N/A;  . PERIPHERAL VASCULAR CATHETERIZATION N/A 12/25/2014   Procedure: Upper Extremity Venography;  Surgeon: Conrad Biloxi, MD;  Location: [place] CV LAB;  Service: Cardiovascular;  Laterality: N/A;  . PERITONEAL CATHETER INSERTION    . PERITONEAL CATHETER REMOVAL    . REVISON OF ARTERIOVENOUS FISTULA Right [id number]   Procedure: PLICATION / REVISION OF ARTERIOVENOUS FISTULA;  Surgeon: Harrell Gave  Nicole Cella, MD;  Location: [place];  Service: Vascular;  Laterality: Right;  . REVISON OF ARTERIOVENOUS FISTULA Left 09/01/2015   Procedure: SUPERFICIALIZATION OF LEFT ARM BRACHIOCEPHALIC ARTERIOVENOUS FISTULA;  Surgeon: Angelia Mould, MD;  Location: [place];  Service: Vascular;  Laterality: Left;  . SHOULDER ARTHROSCOPY Left ~ 2006   frozen  . TOE AMPUTATION Bilateral after 2005  . UMBILICAL HERNIA REPAIR  1990's  . VENOGRAM Right 07/28/2011   Procedure: VENOGRAM;  Surgeon: Conrad Clinchport, MD;  Location: [facility] CATH LAB;  Service: Cardiovascular;  Laterality: Right;  . VITRECTOMY Bilateral     Allergies  Allergen Reactions  . Contrast Media [Iodinated Diagnostic Agents] Rash and Itching    Systemic itching and transient rash appearance within hours of receiving contrast  Systemic itching and transient rash appearance within hours of receiving contrast  Redman Syndrome  . Iodides Rash  . Ioxaglate Rash  . Rocephin [Ceftriaxone] Itching  . Amoxicillin Itching and Rash    .Has patient had a PCN reaction causing immediate rash, facial/tongue/throat swelling, SOB or lightheadedness with hypotension: Yes Has patient had a PCN reaction causing severe rash involving mucus membranes or skin necrosis: No Has patient had a PCN reaction that required hospitalization No Has patient had a PCN  reaction occurring within the last 10 years: No If all of the above answers are "NO", then may proceed with Cephalosporin use. Has patient had a PCN reaction causing immediate rash, facial/tongue/throat swelling, SOB or lightheadedness with hypotension: Yes Has patient had a PCN reaction causing severe rash involving mucus membranes or skin necrosis: No Has patient had a PCN reaction that required hospitalization No Has patient had a PCN reaction occurring within the last 10 years: No If all of the above answers are "NO", then may proceed with Cephalosporin use.  . Buprenorphine Hcl Nausea Only and Hives  . Clopidogrel Rash  . Enalapril Rash    cough  . Hydrocodone Rash  . Morphine Nausea Only, Rash and Hives  . Morphine And Related Hives and Nausea Only  . Adhesive [Tape] Other (See Comments) and Itching    Blisters on arm from adhesive tape at dialysis Blisters on arm from adhesive tape at dialysis  . Ciprofloxacin Itching and Rash  . Clindamycin/Lincomycin Hives and Itching  . Codeine Hives, Itching and Rash  . Doxycycline Rash  . Enalaprilat     Other reaction(s): Cough (ALLERGY/intolerance)  . Levofloxacin Hives and Itching  . Lincomycin Hcl Itching and Hives  . Mobic [Meloxicam] Hives and Rash  . Oxycodone Rash  . Plavix [Clopidogrel Bisulfate] Rash  . Vasotec Cough   Prescriptions Prior to Admission  Medication Sig Dispense Refill Last Dose  . acetaminophen (TYLENOL) 500 MG tablet Take 1,000 mg by mouth every 6 (six) hours as needed for moderate pain or headache.   12/21/2015 at Unknown time  . allopurinol (ZYLOPRIM) 100 MG tablet Take 200 mg by mouth daily.    12/20/2015 at Unknown time  . aspirin EC 81 MG tablet Take 81 mg by mouth at bedtime.   12/20/2015 at Unknown time  . atorvastatin (LIPITOR) 40 MG tablet Take 40 mg by mouth at bedtime.  0 12/20/2015 at Unknown time  . calcium acetate (PHOSLO) 667 MG capsule Take 1,334-2,001 mg by mouth See admin instructions. Take 3  capsules (2001 mg) by mouth with meals and 2 capsule (1334mg  ) with snacks   12/20/2015 at Unknown time  . diphenhydrAMINE (BENADRYL) 25 mg capsule Take 1-2 capsules (25-50  mg total) by mouth every 6 (six) hours as needed for itching. (Patient taking differently: Take 50 mg by mouth 2 (two) times daily as needed for itching or sleep. ) 30 capsule 0 12/20/2015 at Unknown time  . fenofibrate (TRICOR) 48 MG tablet Take 1 tablet (48 mg total) by mouth daily. 30 tablet 0 12/20/2015 at Unknown time  . Ferric Citrate (AURYXIA) 1 GM 210 MG(Fe) TABS Take 2 g by mouth 3 (three) times daily with meals.   12/20/2015 at Unknown time  . gabapentin (NEURONTIN) 300 MG capsule Take 300 mg by mouth 3 (three) times daily.   12/20/2015 at Unknown time  . HYDROmorphone (DILAUDID) 2 MG tablet Take 1 tablet (2 mg total) by mouth 3 (three) times daily as needed for severe pain. (Patient taking differently: Take 2 mg by mouth 2 (two) times daily. Take 1 tablet (2 mg) by mouth daily at bedtime, may take an additional dose during the day as needed for severe pain) 20 tablet 0 12/20/2015 at Unknown time  . ibuprofen (ADVIL,MOTRIN) 200 MG tablet Take 400 mg by mouth 2 (two) times daily as needed for moderate pain.   12/21/2015 at Unknown time  . Insulin Human (INSULIN PUMP) 100 unit/ml SOLN Inject 1 each into the skin continuous. Humalog   12/21/2015 at Unknown time  . insulin lispro (HUMALOG) 100 UNIT/ML injection Inject 175 Units into the skin continuous. Up to 175 units continuous in pump,  Basal rates area as follows:  2400 1.5 units/hr, 0300 1.9 units/hr, 0800 1.45 units/hr, 1200 1.625 units/hr   12/21/2015 at Unknown time  . levothyroxine (SYNTHROID, LEVOTHROID) 100 MCG tablet Take 100 mcg by mouth at bedtime.    12/20/2015 at Unknown time  . loratadine (CLARITIN) 10 MG tablet Take 10 mg by mouth daily.   Past Week at Unknown time  . loratadine-pseudoephedrine (CLARITIN-D 12-HOUR) 5-120 MG tablet Take 1 tablet by mouth 2 (two) times  daily.   12/21/2015 at Unknown time  . metoCLOPramide (REGLAN) 5 MG tablet Take 5 mg by mouth 4 (four) times daily - after meals and at bedtime.   12/20/2015 at Unknown time  . midodrine (PROAMATINE) 10 MG tablet Take 20 mg by mouth every Monday, Wednesday, and Friday. On dialysis days   12/21/2015 at Unknown time  . multivitamin (RENA-VIT) TABS tablet Take 1 tablet by mouth daily.   12/20/2015 at Unknown time  . nitroGLYCERIN (NITROSTAT) 0.4 MG SL tablet Place 0.4 mg under the tongue every 5 (five) minutes as needed for chest pain.    unknown  . omeprazole (PRILOSEC) 20 MG capsule Take 20 mg by mouth daily.   12/21/2015 at Unknown time  . promethazine (PHENERGAN) 25 MG tablet Take 1 tablet (25 mg total) by mouth every 6 (six) hours as needed for nausea or vomiting. For nausea 30 tablet 0 12/20/2015 at Unknown time  . rOPINIRole (REQUIP) 2 MG tablet Take 2 mg by mouth daily with supper.    12/20/2015 at Unknown time  . GLUCAGON EMERGENCY 1 MG injection Inject 1 mg into the muscle daily as needed (low blood sugar).    Taking  . HYDROmorphone (DILAUDID) 2 MG tablet Take 1 tablet (2 mg total) by mouth every 4 (four) hours as needed for severe pain. (Patient not taking: Reported on 10/14/2015) 30 tablet 0 Not Taking at Unknown time   Family History  Problem Relation Age of Onset  . Colon polyps Father   . Diabetes Father   . Hypertension Father   .  Hypertension Mother   . Diabetes Mother   . Hyperlipidemia Mother   . Breast cancer Maternal Aunt   . Cancer Maternal Aunt     Breast and Bone  . Malignant hyperthermia Neg Hx   . Heart attack Neg Hx   . Stroke Neg Hx     Social History   Social History  . Marital status: Married    Spouse name: N/A  . Number of children: N/A  . Years of education: N/A   Occupational History  . Disabled    Social History Main Topics  . Smoking status: Never Smoker  . Smokeless tobacco: Never Used  . Alcohol use Yes  . Drug use: No  . Sexual activity: Not on  file   Other Topics Concern  . Not on file   Social History Narrative   Lives @ home with his wife in [place].      ROS:    As stated in the HPI and negative for all other systems.  Physical Exam: Blood pressure (!) 153/74, pulse (!) 101, temperature 97.3 F (36.3 C), resp. rate 15, height 5\' 8"  (1.727 m), weight (!) 304 lb 7.3 oz (138.1 kg), SpO2 100 %. GENERAL:  Well appearing HEENT:  Pupils equal round and reactive, fundi not visualized, oral mucosa unremarkable NECK:  No jugular venous distention, waveform within normal limits, carotid upstroke brisk and symmetric, no bruits, no thyromegaly LYMPHATICS:  No cervical, inguinal adenopathy LUNGS:  Clear to auscultation bilaterally BACK:  No CVA tenderness CHEST:  Unremarkable HEART:  PMI not displaced or sustained,S1 and S2 within normal limits, no S3, no S4, no clicks, no rubs, no murmurs ABD:  Flat, positive bowel sounds normal in frequency in pitch, no bruits, no rebound, no guarding, no midline pulsatile mass, no hepatomegaly, no splenomegaly, morbidly obese EXT:  2 plus pulses throughout, no edema, no cyanosis no clubbing, left BKA, diffuse edema SKIN:  No rashes no nodules NEURO:  Cranial nerves II through XII grossly intact, motor grossly intact throughout PSYCH:  Cognitively intact, oriented to person place and time   Labs: Lab Results  Component Value Date   BUN 76 (H) 12/25/2015   Lab Results  Component Value Date   CREATININE 7.87 (H) 12/25/2015   Lab Results  Component Value Date   NA 136 12/25/2015   K 4.6 12/25/2015   CL 97 (L) 12/25/2015   CO2 24 12/25/2015   Lab Results  Component Value Date   TROPONINI 5.43 (HH) 12/23/2015   Lab Results  Component Value Date   WBC 6.2 12/25/2015   HGB 6.7 (LL) 12/25/2015   HCT 21.4 (L) 12/25/2015   MCV 99.1 12/25/2015   PLT 160 12/25/2015   Lab Results  Component Value Date   CHOL 143 02/25/2015   HDL 29 (L) 02/25/2015   LDLCALC 63 02/25/2015   TRIG  255 (H) 02/25/2015   CHOLHDL 4.9 02/25/2015   Lab Results  Component Value Date   ALT 25 12/23/2015   AST 39 12/23/2015   ALKPHOS 45 12/23/2015   BILITOT 0.3 12/23/2015   ECHO:  - Left ventricle: Systolic function was normal. The estimated   ejection fraction was in the range of 60% to 65%. Features are   consistent with a pseudonormal left ventricular filling pattern,   with concomitant abnormal relaxation and increased filling   pressure (grade 2 diastolic dysfunction).   Radiology:   CXR: 12/22/15  1. Lines and tubes in stable position. 2. Persistent changes of  mild congestive heart failure with cardiomegaly, mild pulmonary venous congestion, mild interstitial edema and small left pleural effusion. No interim change from prior exam.   EKG:     Sinus rhythm, rate 87, axis within normal limits, intervals within normal limits, no acute ST-T wave changes.  12/21/15   ASSESSMENT AND PLAN:   CHEST PAIN:  History and enzymes are consistent with NSTEMI.  However, he is not currently a candidate for invasive or other evaluation.  Would start ASA only when risk of active bleeding is felt to be low.  It may well have been that his arm pain was precipitated by his anemia.  Enzyme peak was high but brief.  I think we can risk stratify as an outpatient with a Lexiscan Myoview once his anemia is resolved/Hbg stable.  It is difficult to suggest beta blocker since he has had long term problems with hypotension and is on Midodrine although his current BP is high.  RESPIRATORY FAILURE:    Resolved.   ACUTE ANEMIA:   Plan per primary team.    ESRD:  Per renal.  Now with diffuse edema that will need to be slowly mobilized.   SignedMinus Breeding 12/25/2015, 4:38 PM

## 2015-12-25 NOTE — Anesthesia Postprocedure Evaluation (Signed)
Anesthesia Post Note  Patient: Filomeno Frilot  Procedure(s) Performed: Procedure(s) (LRB): ESOPHAGOGASTRODUODENOSCOPY (EGD) (N/A)  Patient location during evaluation: PACU Anesthesia Type: MAC Level of consciousness: awake and alert Pain management: pain level controlled Vital Signs Assessment: post-procedure vital signs reviewed and stable Respiratory status: spontaneous breathing, nonlabored ventilation, respiratory function stable and patient connected to nasal cannula oxygen Cardiovascular status: stable and blood pressure returned to baseline Anesthetic complications: no    Last Vitals:  Vitals:   12/25/15 0917 12/25/15 1015  BP: (!) 158/58 (!) 145/51  Pulse: 93 96  Resp: (!) 21 16  Temp:      Last Pain:  Vitals:   12/25/15 0755  TempSrc: Oral  PainSc:                  Zenaida Deed

## 2015-12-25 NOTE — Transfer of Care (Signed)
Immediate Anesthesia Transfer of Care Note  Patient: Judson Mazor  Procedure(s) Performed: Procedure(s) with comments: ESOPHAGOGASTRODUODENOSCOPY (EGD) (N/A) - bedside  Patient Location: Endoscopy Unit  Anesthesia Type:MAC  Level of Consciousness: awake, alert  and oriented  Airway & Oxygen Therapy: Patient Spontanous Breathing and Patient connected to nasal cannula oxygen  Post-op Assessment: Report given to RN, Post -op Vital signs reviewed and stable and Patient moving all extremities X 4  Post vital signs: Reviewed and stable  Last Vitals:  Vitals:   12/25/15 0755 12/25/15 0917  BP: (!) 150/61 (!) 158/58  Pulse: 94 93  Resp: 11 (!) 21  Temp: 36.4 C     Last Pain:  Vitals:   12/25/15 0755  TempSrc: Oral  PainSc:       Patients Stated Pain Goal: 2 ([id number])  Complications: No apparent anesthesia complications

## 2015-12-25 NOTE — Progress Notes (Signed)
PT Cancellation Note  Patient Details Name: Dale [name] MRN: [id number] DOB: 1966/04/16   Cancelled Treatment:    Reason Eval/Treat Not Completed: Patient at procedure or test/unavailable. Will continue attempts.   [name] 12/25/2015, 9:20 AM [facility] PT [phone]

## 2015-12-25 NOTE — Progress Notes (Signed)
Bedside RN called for assistance with central line after pt removed while sleeping. Pt states he was "having a bad dream." Sutures removed, assisted with cleaning pt and linen change. Pt alert and oriented. VSS. Encouraged Ayaba RN to call if needed.

## 2015-12-25 NOTE — Anesthesia Procedure Notes (Signed)
Procedure Name: MAC Date/Time: 12/25/2015 9:46 AM Performed by: Kyung Rudd Pre-anesthesia Checklist: Patient identified, Emergency Drugs available, Suction available and Patient being monitored Patient Re-evaluated:Patient Re-evaluated prior to inductionOxygen Delivery Method: Nasal cannula Intubation Type: IV induction Placement Confirmation: positive ETCO2

## 2015-12-25 NOTE — Anesthesia Preprocedure Evaluation (Addendum)
Anesthesia Evaluation  Patient identified by MRN, date of birth, ID band Patient awake    Reviewed: Allergy & Precautions, NPO status   History of Anesthesia Complications (+) DIFFICULT AIRWAY and history of anesthetic complications  Airway Mallampati: II  TM Distance: >3 FB Neck ROM: Full    Dental  (+) Poor Dentition, Dental Advisory Given   Pulmonary shortness of breath, sleep apnea ,    Pulmonary exam normal breath sounds clear to auscultation       Cardiovascular hypertension, Pt. on medications + CAD and +CHF  Normal cardiovascular exam Rhythm:Regular Rate:Normal     Neuro/Psych Anxiety  Neuromuscular disease    GI/Hepatic GERD  ,  Endo/Other  diabetes, Type 2, Insulin DependentHypothyroidism   Renal/GU ESRF and DialysisRenal disease     Musculoskeletal  (+) Arthritis , Fibromyalgia -  Abdominal   Peds  Hematology  (+) anemia ,   Anesthesia Other Findings   Reproductive/Obstetrics                            Anesthesia Physical  Anesthesia Plan  ASA: III  Anesthesia Plan: MAC   Post-op Pain Management:    Induction: Intravenous  Airway Management Planned: Natural Airway  Additional Equipment:   Intra-op Plan:   Post-operative Plan:   Informed Consent: I have reviewed the patients History and Physical, chart, labs and discussed the procedure including the risks, benefits and alternatives for the proposed anesthesia with the patient or authorized representative who has indicated his/her understanding and acceptance.     Plan Discussed with: CRNA and Surgeon  Anesthesia Plan Comments:         Anesthesia Quick Evaluation

## 2015-12-25 NOTE — Progress Notes (Signed)
Received call from patient spouse following husband call made to her at home to narrate his incident; Asking where the " new PICC line" is inserted and if patient had been clean up after central line pull. Became frustrated and verbally abusive raising her tone while inquiring her prior to giving any patient information, not knowing she already spoke to the charge nurse who transferred the call " I told you I am his wife... .[name] KitchenMarland KitchenDo you speak English?[name] Kitchen..." Then ended the call saying "Well, I think I will call back to speak to someone who speak English" and hang up. Informed patient how disrespectful his spouse was over the phone toward this nurse trying to obtain her identity. Expressed his apology for her conduct.

## 2015-12-25 NOTE — Progress Notes (Signed)
Patient arrived to unit by bed.  Reviewed treatment plan and this RN agrees with plan.  Report received from bedside RN, Ayaba.  Consent verified.  Patient A & o X 4.   Lung sounds diminished to ausculation in all fields. Generalized edema. Cardiac:  NSR.  Removed caps and cleansed RIJ catheter with chlorhedxidine.  Aspirated ports of heparin and flushed them with saline per protocol.  Connected and secured lines, initiated treatment at 1126.  UF Goal of 5052mL and net fluid removal 4L.  Will continue to monitor.

## 2015-12-25 NOTE — Progress Notes (Signed)
Assessment/Plan: 1. Acute GI Bleed:s/p PRBCs, need to transfuse again 2. Chest pain: per CCM 3. Hyperkalemia: improved 4. ESRD- MWF-[facility]. HD today to get back on schedule. AVF working in [place] 5. ChronicHypotension-on midodrine pre HD 6. Anemia-  7. S/p VDRF   Subjective: Interval History: Pulled out CVL in [facility] last PM during a dream.  Objective: Vital signs in last 24 hours: Temp:  [97.5 F (36.4 C)-99.1 F (37.3 C)] 97.5 F (36.4 C) (09/01 0755) Pulse Rate:  [92-102] 93 (09/01 0917) Resp:  [11-29] 21 (09/01 0917) BP: (64-158)/(46-61) 158/58 (09/01 0917) SpO2:  [99 %-100 %] 100 % (09/01 0917) Weight:  [132.7 kg (292 lb 8.8 oz)] 132.7 kg (292 lb 8.8 oz) (09/01 0300) Weight change: -3.3 kg (-7 lb 4.4 oz)  Intake/Output from previous day: 08/31 0701 - 09/01 0700 In: 22 [I.V.:22] Out: -  Intake/Output this shift: Total I/O In: 240 [P.O.:240] Out: -   General appearance: alert and cooperative Resp: clear to auscultation bilaterally Chest wall: no tenderness, RIJ TDC Cardio: regular rate and rhythm, S1, S2 normal, no murmur, click, rub or gallop  Lab Results:  Recent Labs  12/24/15 2156 12/25/15 0555  WBC 7.8 6.2  HGB 7.1* 6.7*  HCT 22.8* 21.4*  PLT 159 160   BMET:  Recent Labs  12/24/15 1559 12/25/15 0555  NA 137 136  K 4.4 4.6  CL 101 97*  CO2 24 24  GLUCOSE 157* 275*  BUN 60* 76*  CREATININE 6.55* 7.87*  CALCIUM 8.2* 8.4*   No results for input(s): PTH in the last 72 hours. Iron Studies: No results for input(s): IRON, TIBC, TRANSFERRIN, FERRITIN in the last 72 hours. Studies/Results: No results found.  Scheduled: . [MAR Hold] sodium chloride   Intravenous Once  . [MAR Hold] fentaNYL (SUBLIMAZE) injection  50 mcg Intravenous Once  . [MAR Hold] gabapentin  300 mg Oral TID  . [MAR Hold] hydrocortisone sod succinate (SOLU-CORTEF) inj  25 mg Intravenous Q12H  . [MAR Hold] insulin aspart  0-15 Units Subcutaneous Q4H  . [MAR Hold]  levothyroxine  100 mcg Oral QAC breakfast  . [MAR Hold] loratadine  5 mg Oral BID   Or  . [MAR Hold] pseudoephedrine  120 mg Oral BID  . [MAR Hold] mouth rinse  15 mL Mouth Rinse BID  . [MAR Hold] pantoprazole  40 mg Oral BID  . [MAR Hold] rOPINIRole  2 mg Oral Q supper  . [MAR Hold] sodium chloride flush  10-40 mL Intracatheter Q12H    LOS: 4 days   Cecilia Nishikawa C 12/25/2015,9:43 AM

## 2015-12-25 NOTE — Progress Notes (Signed)
PT Cancellation Note  Patient Details Name: Dale [name] MRN: [id number] DOB: [birth year]   Cancelled Treatment:    Reason Eval/Treat Not Completed: Patient at procedure or test/unavailable. Pt in EGD earlier and now in HD.   Kaelen Caughlin 12/25/2015, 12:04 PM [facility] PT [phone]

## 2015-12-25 NOTE — Progress Notes (Signed)
Inpatient Diabetes Program Recommendations  AACE/ADA: New Consensus Statement on Inpatient Glycemic Control (2015)  Target Ranges:  Prepandial:   less than 140 mg/dL      Peak postprandial:   less than 180 mg/dL (1-2 hours)      Critically ill patients:  140 - 180 mg/dL   Lab Results  Component Value Date   GLUCAP 245 (H) 12/25/2015   HGBA1C 6.6 (H) 02/25/2015    Review of Glycemic Control:  Results for PRIEST, GARG (MRN [id number]) as of 12/25/2015 09:51  Ref. Range 12/24/2015 15:16 12/24/2015 20:02 12/24/2015 23:33 12/25/2015 03:58 12/25/2015 07:32  Glucose-Capillary Latest Ref Range: 65 - 99 mg/dL 154 (H) 307 (H) 180 (H) 234 (H) 245 (H)   Diabetes history: Type 2 diabetes  Inpatient Diabetes Program Recommendations:  Please restart Lantus 24 units daily.  Note that patient has history of insulin pump usage prior to admit.  Will need basal insulin and possibly meal coverage once eating.   Thanks, Adah Perl, RN, BC-ADM Inpatient Diabetes Coordinator Pager [phone] (8a-5p)

## 2015-12-25 NOTE — H&P (View-Only) (Signed)
Daily Rounding Note  12/24/2015, 9:40 AM  LOS: 3 days   SUBJECTIVE:   Chief complaint: shortness or breath and confusion  Pt remains extubated.  Mental status remains improved. Breathing ok.  On full liquids.   OBJECTIVE:         Vital signs in last 24 hours:    Temp:  [97.5 F (36.4 C)-98.2 F (36.8 C)] 97.8 F (36.6 C) (08/31 0823) Pulse Rate:  [90-108] 103 (08/31 0800) Resp:  [9-25] 22 (08/31 0800) BP: (79-142)/(30-75) 107/38 (08/31 0800) SpO2:  [89 %-100 %] 99 % (08/31 0800) Weight:  [134 kg (295 lb 6.7 oz)-136 kg (299 lb 13.2 oz)] 134.1 kg (295 lb 10.2 oz) (08/31 0500) Last BM Date: 12/23/15 Filed Weights   12/23/15 1900 12/23/15 2200 12/24/15 0500  Weight: 136 kg (299 lb 13.2 oz) 134 kg (295 lb 6.7 oz) 134.1 kg (295 lb 10.2 oz)   General: obese, pale but looks better.     Heart: RRR Chest: clear bil.  Some dyspnea, but improved Abdomen: obese, NT, active BS  Extremities: no CCE.   Neuro/Psych:  Oriented x 3.  Moves all 4 limbs.    Intake/Output from previous day: 08/30 0701 - 08/31 0700 In: 1034.6 [I.V.:774.6] Out: 2050 [Emesis/NG output:50]  Intake/Output this shift: No intake/output data recorded.  Lab Results:  Recent Labs  12/23/15 1830 12/23/15 2020 12/24/15 0420  WBC 12.8* 13.1* 10.0  HGB 7.8* 8.1* 7.3*  HCT 24.8* 25.3* 23.0*  PLT 154 157 150   BMET  Recent Labs  12/23/15 1830 12/23/15 2021 12/24/15 0420  NA 137 137 137  K 6.1* 4.9 4.6  CL 106 102 100*  CO2 22 24 24   GLUCOSE 294* 224* 247*  BUN 95* 66* 50*  CREATININE 7.65* 5.44* 5.46*  CALCIUM 8.2* 8.4* 8.1*   LFT  Recent Labs  12/23/15 0615  12/23/15 1830 12/23/15 2021 12/24/15 0420  PROT 5.1*  --   --   --   --   ALBUMIN 2.4*  < > 2.7* 2.8* 2.5*  AST 39  --   --   --   --   ALT 25  --   --   --   --   ALKPHOS 45  --   --   --   --   BILITOT 0.3  --   --   --   --   < > = values in this interval not  displayed. PT/INR  Recent Labs  12/21/15 1506  LABPROT 15.3*  INR 1.20   Hepatitis Panel No results for input(s): HEPBSAG, HCVAB, HEPAIGM, HEPBIGM in the last 72 hours.  Studies/Results: Dg Chest Port 1 View  Result Date: 12/23/2015 CLINICAL DATA:  Respiratory failure. EXAM: PORTABLE CHEST 1 VIEW COMPARISON:  12/22/2015. FINDINGS: Endotracheal tube, NG tube, left eye J line, large caliber right IJ line in stable position. Cardiomegaly with mild pulmonary venous congestion and interstitial prominence suggesting mild congestive heart failure. Small left pleural effusion. Chest is unchanged from prior exam. No pneumothorax . IMPRESSION: 1. Lines and tubes in stable position. 2. Persistent changes of mild congestive heart failure with cardiomegaly, mild pulmonary venous congestion, mild interstitial edema and small left pleural effusion. No interim change from prior exam. Electronically Signed   By: Marcello Moores  Register   On: 12/23/2015 06:58    ASSESMENT:   *  Acute anemia.  FOBT +, resolved black NGT output, no melena, BPR.  S/p PRBC  x 4.  Hgb down today. Protonix gtt completed and on IV BID PPI.   *   ESRD.  HD normally MWF, but will dialyze today.  CRRT discontinued 8/30.    *   Elevated Troponins, CCM suspects demand ischemia due to anemia.  Possible non STEMI, ACS.   *  Encephalopathy.  Improved.     PLAN   *  At Dr Janit Pagan request/suggestion, the EGD is cancelled due to high risk of resp failure and reintubation due to sedation.  Tentatively have slot for EGD with MAC tomorrow at  0900.    *  Switch PPI to Protonix 40 mg po bid.  Advance to ESRD, carb mod diet in future.  NPO post midnight for possible EGD.  *  Follow Hgb, 1800 today and 0500 tomorrow.        Dale [name]  12/24/2015, 9:40 AM Pager: [phone]

## 2015-12-25 NOTE — Progress Notes (Signed)
Dialysis treatment completed.  3500 mL ultrafiltrated.  2500 mL net fluid removal.  Patient status unchanged. Lung sounds clear to ausculation in all fields. Generalized edema. Cardiac: NSR.  Cleansed RIJ catheter with chlorhexidine.  Disconnected lines and flushed ports with saline per protocol.  Ports locked with heparin and capped per protocol.    Report given to bedside, RN Janett Billow.

## 2015-12-25 NOTE — Interval H&P Note (Signed)
History and Physical Interval Note:  12/25/2015 9:32 AM Respiratory status stable in the past 24 hours. Hgb dropped to 6.7, he pulled central line last night in his sleep and loss a lot of blood per patient. No BM since yesterday Dale [name]  has presented today for surgery, with the diagnosis of anemia, dark NGT output  The various methods of treatment have been discussed with the patient and family. After consideration of risks, benefits and other options for treatment, the patient has consented to  Procedure(s) with comments: ESOPHAGOGASTRODUODENOSCOPY (EGD) (N/A) - bedside as a surgical intervention .  [name] history has been reviewed, patient examined, no change in status, stable for surgery.  I have reviewed the patient's chart and labs.  Questions were answered to the patient's satisfaction.     [name]

## 2015-12-25 NOTE — Progress Notes (Signed)
Found patient sitting on the side of bed during round at 3 am; deshelved, body  covered with blood and not saying a word. Noted LIJ catheter pulled. Report having "bad dream and not knowing where I was" Had brief period of confusion then able to state name, place, and date of birth. [facility] response team called in to assist. Removed sutures and assessed patient in no acute distress. Patient cleaned, full linen, ECG leads and BP cuff changed. IV team paged and new peripheral line inserted @ R shoulder. Assisted patient back to bed. Green mittens applied to avoid HD catheter be pulled until patient fully alert and oriented. Vital signs obtained and stable.Mittens removed per patient request after accuckecks performed at 4 am and patient  reluctantly to don them. Explained the risk and severity to attempt pulling on the hemodialysis catheter. Verbalized understanding. Providing frequent rounds to ensure safety.

## 2015-12-25 NOTE — Op Note (Signed)
[facility] [address] Patient Name: Dale [name] Procedure Date : 12/25/2015 MRN: [id number] Attending MD: Mauri Pole , MD Date of Birth: 31-Mar-1966 CSN: [id number] Age: 50 Admit Type: Inpatient Procedure:                Upper GI endoscopy Indications:              Suspected upper gastrointestinal bleeding Providers:                Mauri Pole, MD, Carolynn Comment, RN,                            Otilio Saber, Technician, Rhae Lerner, CRNA Referring MD:              Medicines:                Monitored Anesthesia Care Complications:            No immediate complications. Estimated Blood Loss:     Estimated blood loss was minimal. Procedure:                Pre-Anesthesia Assessment:                           - Prior to the procedure, a History and Physical                            was performed, and patient medications and                            allergies were reviewed. [name] tolerance of                            previous anesthesia was also reviewed. The risks                            and benefits of the procedure and the sedation                            options and risks were discussed with the patient.                            All questions were answered, and informed consent                            was obtained. Prior Anticoagulants: The patient                            last took aspirin on the day of the procedure. ASA                            Grade Assessment: IV - A patient with severe                            systemic disease that is a constant threat to life.  After reviewing the risks and benefits, the patient                            was deemed in satisfactory condition to undergo the                            procedure.                           After obtaining informed consent, the endoscope was                            passed under direct vision. Throughout the       procedure, the patient's blood pressure, pulse, and                            oxygen saturations were monitored continuously. The                            [id number]) scope was introduced through the                            mouth, and advanced to the second part of duodenum.                            The upper GI endoscopy was accomplished without                            difficulty. The patient tolerated the procedure                            well. Scope In: Scope Out: Findings:      The esophagus was normal.      Patchy moderately erythematous mucosa with bleeding on contact was found       in the cardia, gastric fundus and body. Antrum and pylroic channel       appeared normal.      The examined duodenum was normal. Impression:               - Normal esophagus.                           - Erythematous mucosa in the cardia, gastric fundus                            and gastric body.                           - Normal examined duodenum.                           - Did not identify any high risk lesion on upper                            endoscopy to cause possible brisk upper GI blood  loss                           - No specimens collected. Moderate Sedation:      N/A Recommendation:           - Resume previous diet.                           - Continue present medications.                           - No ibuprofen, naproxen, or other non-steroidal                            anti-inflammatory drugs.                           - Use Protonix (pantoprazole) 40 mg PO BID.                           - Will sign off, available for any questions Procedure Code(s):        --- Professional ---                           [phone], Esophagogastroduodenoscopy, flexible,                            transoral; diagnostic, including collection of                            specimen(s) by brushing or washing, when performed                             (separate procedure) Diagnosis Code(s):        --- Professional ---                           K31.89, Other diseases of stomach and duodenum CPT copyright 2016 [employer]. All rights reserved. The codes documented in this report are preliminary and upon coder review may  be revised to meet current compliance requirements. Mauri Pole, MD 12/25/2015 10:17:56 AM This report has been signed electronically. Number of Addenda: 0

## 2015-12-25 NOTE — Progress Notes (Signed)
PROGRESS NOTE    Dale [name]  [id number] DOB: Aug 31, 1965 DOA: 12/21/2015 PCP: Dale Dales, MD    Brief Narrative: Dale [name] is a 50 y.o. male with PMH as outlined below including ESRD (M/W/F).  Per nephrology service, pt is extremely compliant with HD never missing any sessions.  On 8/27 he developed L arm pain which he initially thought was due to his "fistula not working".  Pain resolved spontaneously therefore he did feel that he needed to be evaluated.  The following morning 8/28, when he woke up to go to HD, pain was back and was worse.  He therefore went to ED for further evaluation.  In ED, he was found to have K 7, SCr 12.37, BUN 219, Hgb 6.9.  He was taken for urgent HD and after roughly 1 hour, he began to have diaphoresis, persistent L arm pain which radiated into chest, SOB, worsening anxiety.   PCCM was called and pt was emergently intubated while in HD unit.  He was transferred to ICU for further evaluation / management.   Assessment & Plan:   Active Problems:   Gastrointestinal hemorrhage with melena   Encounter for intubation   Acute blood loss anemia   Uremia   Acute encephalopathy   Troponin level elevated   Encounter for central line placement   Anemia - suspect hemorrhagic due to GI bleed PPI oral BID.  Endoscopy 9-01; normal esophagus, erythematous mucosa in the cardia, gastric fundus and gastric body. No source for bleeding identified.  To received one unit PRBC during dialysis.    ESRD (M/W/F). Hyperkalemia - s/p emergent HD 8/28. Uremia - concern due to GI bleed. Hyponatremia. AGMA - uremia Improved with Dialysis.   Acute hypoxic respiratory failure. OSA with probable OHS. Improved. Patient to have HD today.  Discussed with Dr Titus Mould, ok to proceed with endoscopy if needed.   Hypotension - suspect hemorrhagic due to GI bleed along with component hypovolemia - Received levophed.  BP improved.  Stress dose steroids. Tapering  25 BID.   Positive troponin;- suspect demand ischemia due to GI bleed.  Will consider NSTEMI given chest pain however vs demand ischemia ECHO; EF 65 %  Troponin: -7.9----18----5.4 Cardiology consulted for further evaluation.   Hx HTN, HLD, CAD, dCHF (Echo Nov 2016 with EF 60-65%, G2DD).  DM; SSI.  Start low dose lantus.   Acute encephalopathy improved Hx fibromyalgia. On gabapentin  phenergan PRN Started on ropinirole. Alert oriented.       DVT prophylaxis: SCD.  Code Status: Full code.  Family Communication: Wife at bedside.  Disposition Plan:endoscopy today. He will need cardiology evaluation.     Consultants:   CCM admitted patient.   GI  Nephrology    Procedures:  Endoscopy 9-01  Antimicrobials:   none   Subjective: He is alert, Respiratory status stable. denies dyspnea.     Objective: Vitals:   12/24/15 2003 12/24/15 2332 12/25/15 0300 12/25/15 0755  BP: (!) 106/48 (!) 115/59 (!) 139/52 (!) 150/61  Pulse: 97 93 92 94  Resp: 16 17 12 11   Temp: 99.1 F (37.3 C) 98.3 F (36.8 C) 97.6 F (36.4 C) 97.5 F (36.4 C)  TempSrc: Oral Oral Oral Oral  SpO2: 100% 100% 100% 100%  Weight:   132.7 kg (292 lb 8.8 oz)   Height:        Intake/Output Summary (Last 24 hours) at 12/25/15 0837 Last data filed at 12/25/15 0600  Gross per 24 hour  Intake  22 ml  Output                0 ml  Net               22 ml   Filed Weights   12/23/15 2200 12/24/15 0500 12/25/15 0300  Weight: 134 kg (295 lb 6.7 oz) 134.1 kg (295 lb 10.2 oz) 132.7 kg (292 lb 8.8 oz)    Examination:  General exam: Appears calm and comfortable  Respiratory system: Clear to auscultation. Respiratory effort normal. Cardiovascular system: S1 & S2 heard, RRR. No JVD, murmurs, rubs, gallops or clicks. No pedal edema. Gastrointestinal system: Abdomen is nondistended, soft and nontender. No organomegaly or masses felt. Normal bowel sounds heard. Central nervous system: Alert  and oriented. No focal neurological deficits. Extremities: Symmetric 5 x 5 power. Skin: No rashes, lesions or ulcers Psychiatry: Judgement and insight appear normal. Mood & affect appropriate.     Data Reviewed: I have personally reviewed following labs and imaging studies  CBC:  Recent Labs Lab 12/21/15 0640  12/23/15 1830 12/23/15 2020 12/24/15 0420 12/24/15 2156 12/25/15 0555  WBC 12.8*  < > 12.8* 13.1* 10.0 7.8 6.2  NEUTROABS 9.8*  --   --   --   --   --  4.6  HGB 6.9*  < > 7.8* 8.1* 7.3* 7.1* 6.7*  HCT 21.5*  < > 24.8* 25.3* 23.0* 22.8* 21.4*  MCV 101.9*  < > 98.0 96.6 97.0 98.7 99.1  PLT 259  < > 154 157 150 159 160  < > = values in this interval not displayed. Basic Metabolic Panel:  Recent Labs Lab 12/22/15 0516  12/23/15 0615  12/23/15 1830 12/23/15 2021 12/24/15 0420 12/24/15 1559 12/25/15 0555  NA 136  < > 137  < > 137 137 137 137 136  K 3.9  < > 5.0  < > 6.1* 4.9 4.6 4.4 4.6  CL 100*  < > 104  < > 106 102 100* 101 97*  CO2 22  < > 21*  < > 22 24 24 24 24   GLUCOSE 182*  < > 185*  < > 294* 224* 247* 157* 275*  BUN 107*  < > 85*  < > 95* 66* 50* 60* 76*  CREATININE 6.36*  < > 6.67*  < > 7.65* 5.44* 5.46* 6.55* 7.87*  CALCIUM 7.7*  < > 8.1*  < > 8.2* 8.4* 8.1* 8.2* 8.4*  MG 2.2  --  2.3  --   --   --  2.0  --  2.5*  PHOS 4.4  < >  --   < > 10.2* 6.7* 7.2* 8.1* 9.4*  < > = values in this interval not displayed. GFR: Estimated Creatinine Clearance: 14.9 mL/min (by C-G formula based on SCr of 7.87 mg/dL). Liver Function Tests:  Recent Labs Lab 12/21/15 0640  12/23/15 0615  12/23/15 1830 12/23/15 2021 12/24/15 0420 12/24/15 1559 12/25/15 0555  AST 16  --  39  --   --   --   --   --   --   ALT 14*  --  25  --   --   --   --   --   --   ALKPHOS 47  --  45  --   --   --   --   --   --   BILITOT 0.4  --  0.3  --   --   --   --   --   --  PROT 6.1*  --  5.1*  --   --   --   --   --   --   ALBUMIN 2.9*  < > 2.4*  < > 2.7* 2.8* 2.5* 2.5* 2.4*  < > =  values in this interval not displayed.  Recent Labs Lab 12/21/15 0640  LIPASE 81*   No results for input(s): AMMONIA in the last 168 hours. Coagulation Profile:  Recent Labs Lab 12/21/15 1506  INR 1.20   Cardiac Enzymes:  Recent Labs Lab 12/21/15 0915 12/21/15 1720 12/21/15 1949 12/22/15 0840 12/23/15 0615  TROPONINI 0.26* 3.47* 7.94* 18.04* 5.43*   BNP (last 3 results) No results for input(s): PROBNP in the last 8760 hours. HbA1C: No results for input(s): HGBA1C in the last 72 hours. CBG:  Recent Labs Lab 12/24/15 1516 12/24/15 2002 12/24/15 2333 12/25/15 0358 12/25/15 0732  GLUCAP 154* 307* 180* 234* 245*   Lipid Profile: No results for input(s): CHOL, HDL, LDLCALC, TRIG, CHOLHDL, LDLDIRECT in the last 72 hours. Thyroid Function Tests:  Recent Labs  12/24/15 0501  TSH 2.489   Anemia Panel: No results for input(s): VITAMINB12, FOLATE, FERRITIN, TIBC, IRON, RETICCTPCT in the last 72 hours. Sepsis Labs:  Recent Labs Lab 12/21/15 1125  LATICACIDVEN 3.0*    Recent Results (from the past 240 hour(s))  MRSA PCR Screening     Status: None   Collection Time: 12/21/15 11:39 AM  Result Value Ref Range Status   MRSA by PCR NEGATIVE NEGATIVE Final    Comment:        The GeneXpert MRSA Assay (FDA approved for NASAL specimens only), is one component of a comprehensive MRSA colonization surveillance program. It is not intended to diagnose MRSA infection nor to guide or monitor treatment for MRSA infections.          Radiology Studies: No results found.      Scheduled Meds: . sodium chloride   Intravenous Once  . fentaNYL (SUBLIMAZE) injection  50 mcg Intravenous Once  . gabapentin  300 mg Oral TID  . hydrocortisone sod succinate (SOLU-CORTEF) inj  25 mg Intravenous Q12H  . insulin aspart  0-15 Units Subcutaneous Q4H  . levothyroxine  100 mcg Oral QAC breakfast  . loratadine  5 mg Oral BID   Or  . pseudoephedrine  120 mg Oral BID  .  mouth rinse  15 mL Mouth Rinse BID  . pantoprazole  40 mg Oral BID  . rOPINIRole  2 mg Oral Q supper  . sodium chloride flush  10-40 mL Intracatheter Q12H   Continuous Infusions: . sodium chloride 20 mL/hr at 12/25/15 0524  . dialysis replacement fluid (prismasate) 300 mL/hr at 12/22/15 0428  . dialysis replacement fluid (prismasate) 300 mL/hr at 12/22/15 0428  . dialysate (PRISMASATE) 2,000 mL/hr at 12/22/15 1513     LOS: 4 days    Time spent: 35 minutes.     Elmarie Shiley, MD [facility] Pager [phone]  If 7PM-7AM, please contact night-coverage [email or web address] Password [facility] 12/25/2015, 8:37 AM

## 2015-12-26 DIAGNOSIS — I200 Unstable angina: Secondary | ICD-10-CM

## 2015-12-26 LAB — RENAL FUNCTION PANEL
Albumin: 2.5 g/dL — ABNORMAL LOW (ref 3.5–5.0)
Albumin: 2.5 g/dL — ABNORMAL LOW (ref 3.5–5.0)
Anion gap: 10 (ref 5–15)
Anion gap: 10 (ref 5–15)
BUN: 38 mg/dL — ABNORMAL HIGH (ref 6–20)
BUN: 49 mg/dL — ABNORMAL HIGH (ref 6–20)
CO2: 27 mmol/L (ref 22–32)
CO2: 26 mmol/L (ref 22–32)
Calcium: 8.3 mg/dL — ABNORMAL LOW (ref 8.9–10.3)
Calcium: 8.2 mg/dL — ABNORMAL LOW (ref 8.9–10.3)
Chloride: 97 mmol/L — ABNORMAL LOW (ref 101–111)
Chloride: 95 mmol/L — ABNORMAL LOW (ref 101–111)
Creatinine, Ser: 4.95 mg/dL — ABNORMAL HIGH (ref 0.61–1.24)
Creatinine, Ser: 6.16 mg/dL — ABNORMAL HIGH (ref 0.61–1.24)
GFR calc Af Amer: 14 mL/min — ABNORMAL LOW (ref >60)
GFR calc Af Amer: 11 mL/min — ABNORMAL LOW (ref >60)
GFR calc non Af Amer: 12 mL/min — ABNORMAL LOW (ref >60)
GFR calc non Af Amer: 10 mL/min — ABNORMAL LOW (ref >60)
Glucose, Bld: 165 mg/dL — ABNORMAL HIGH (ref 65–99)
Glucose, Bld: 332 mg/dL — ABNORMAL HIGH (ref 65–99)
Phosphorus: 5.4 mg/dL — ABNORMAL HIGH (ref 2.5–4.6)
Phosphorus: 6.8 mg/dL — ABNORMAL HIGH (ref 2.5–4.6)
Potassium: 3.4 mmol/L — ABNORMAL LOW (ref 3.5–5.1)
Potassium: 3.6 mmol/L (ref 3.5–5.1)
Sodium: 134 mmol/L — ABNORMAL LOW (ref 135–145)
Sodium: 131 mmol/L — ABNORMAL LOW (ref 135–145)

## 2015-12-26 LAB — GLUCOSE, CAPILLARY
Glucose-Capillary: 102 mg/dL — ABNORMAL HIGH (ref 65–99)
Glucose-Capillary: 121 mg/dL — ABNORMAL HIGH (ref 65–99)
Glucose-Capillary: 221 mg/dL — ABNORMAL HIGH (ref 65–99)
Glucose-Capillary: 262 mg/dL — ABNORMAL HIGH (ref 65–99)
Glucose-Capillary: 315 mg/dL — ABNORMAL HIGH (ref 65–99)
Glucose-Capillary: 330 mg/dL — ABNORMAL HIGH (ref 65–99)

## 2015-12-26 LAB — CBC
HCT: 22.8 % — ABNORMAL LOW (ref 39.0–52.0)
Hemoglobin: 7.4 g/dL — ABNORMAL LOW (ref 13.0–17.0)
MCH: 30.8 pg (ref 26.0–34.0)
MCHC: 32.5 g/dL (ref 30.0–36.0)
MCV: 95 fL (ref 78.0–100.0)
Platelets: 178 10*3/uL (ref 150–400)
RBC: 2.4 MIL/uL — ABNORMAL LOW (ref 4.22–5.81)
RDW: 17.1 % — ABNORMAL HIGH (ref 11.5–15.5)
WBC: 7.9 10*3/uL (ref 4.0–10.5)

## 2015-12-26 LAB — PREPARE RBC (CROSSMATCH)

## 2015-12-26 LAB — MAGNESIUM: Magnesium: 2.1 mg/dL (ref 1.7–2.4)

## 2015-12-26 MED ORDER — ATORVASTATIN CALCIUM 80 MG PO TABS
80.0000 mg | ORAL_TABLET | Freq: Every day | ORAL | Status: DC
  Administered 2015-12-26 – 2015-12-31 (×6): 80 mg via ORAL
  Filled 2015-12-26 (×6): qty 1

## 2015-12-26 MED ORDER — FLUTICASONE PROPIONATE 50 MCG/ACT NA SUSP
1.0000 | Freq: Every day | NASAL | Status: DC
  Administered 2015-12-26 – 2016-01-01 (×7): 1 via NASAL
  Filled 2015-12-26: qty 16

## 2015-12-26 MED ORDER — DIPHENHYDRAMINE HCL 12.5 MG/5ML PO ELIX
25.0000 mg | ORAL_SOLUTION | Freq: Three times a day (TID) | ORAL | Status: DC | PRN
  Administered 2016-01-01: 23:00:00 25 mg via ORAL
  Filled 2015-12-26 (×2): qty 10

## 2015-12-26 MED ORDER — ASPIRIN EC 81 MG PO TBEC
81.0000 mg | DELAYED_RELEASE_TABLET | Freq: Every day | ORAL | Status: DC
  Administered 2015-12-26 – 2015-12-31 (×6): 81 mg via ORAL
  Filled 2015-12-26 (×6): qty 1

## 2015-12-26 MED ORDER — SODIUM CHLORIDE 0.9 % IV SOLN: Freq: Once | INTRAVENOUS | Status: DC

## 2015-12-26 MED ORDER — HYDROCORTISONE NA SUCCINATE PF 100 MG IJ SOLR
50.0000 mg | Freq: Once | INTRAMUSCULAR | Status: AC
Start: 2015-12-26 — End: 2015-12-26
  Administered 2015-12-26: 50 mg via INTRAVENOUS
  Filled 2015-12-26: qty 2

## 2015-12-26 MED ORDER — HYDROMORPHONE HCL 1 MG/ML IJ SOLN
0.5000 mg | INTRAMUSCULAR | Status: DC | PRN
  Administered 2015-12-26 – 2016-01-04 (×34): 0.5 mg via INTRAVENOUS
  Filled 2015-12-26 (×33): qty 1

## 2015-12-26 MED ORDER — ATORVASTATIN CALCIUM 40 MG PO TABS: 40.0000 mg | ORAL_TABLET | Freq: Every day | ORAL | Status: DC

## 2015-12-26 MED ORDER — GABAPENTIN 100 MG PO CAPS
200.0000 mg | ORAL_CAPSULE | Freq: Three times a day (TID) | ORAL | Status: DC
  Administered 2015-12-26 – 2016-01-04 (×26): 200 mg via ORAL
  Filled 2015-12-26 (×26): qty 2

## 2015-12-26 MED ORDER — HYDROMORPHONE HCL 2 MG PO TABS
1.0000 mg | ORAL_TABLET | Freq: Three times a day (TID) | ORAL | Status: DC | PRN
  Administered 2015-12-26 – 2016-01-01 (×2): 1 mg via ORAL
  Filled 2015-12-26 (×5): qty 1

## 2015-12-26 NOTE — Evaluation (Signed)
Physical Therapy Evaluation Patient Details Name: Dale [name] MRN: [id number] DOB: August 09, 1965 Today's Date: 12/26/2015   History of Present Illness  Patient is a 50 yo male admitted 12/21/15 with anemia, poss GIB, Hyperkalemia.  Patient less responsive and intubated 12/21/15.  Patient with anemia, acute encephalopathy, VDRF.  Extubated 12/23/15.      PMH:  ESRD on HD, morbid obesity, Lt BKA    Clinical Impression  Patient presents with problems listed below.  Will benefit from acute PT to maximize functional independence prior to discharge home with wife.  Recommend f/u HHPT with transition to OP PT to optimize functional mobility and independence/safety.    Follow Up Recommendations Home health PT;Supervision for mobility/OOB (with transition to OP PT following HHPT)    Equipment Recommendations  None recommended by PT    Recommendations for Other Services       Precautions / Restrictions Precautions Precautions: Fall Required Braces or Orthoses: Other Brace/Splint Other Brace/Splint: Lt BKA prosthesis Restrictions Weight Bearing Restrictions: No      Mobility  Bed Mobility Overal bed mobility: Modified Independent             General bed mobility comments: Increased time.  Use of rail  Transfers Overall transfer level: Needs assistance Equipment used: Rolling walker (2 wheeled) Transfers: Sit to/from Stand Sit to Stand: Min guard         General transfer comment: Patient able to don Lt prosthesis - noted difficulty due to edema in stump.  Will have wife bring shrinkers.  Patient able to stand x3 with min guard assist for safety.  Increased HR with activity 112 - 115.    Ambulation/Gait                Stairs            Wheelchair Mobility    Modified Rankin (Stroke Patients Only)       Balance Overall balance assessment: Needs assistance         Standing balance support: Single extremity supported Standing balance-Leahy Scale: Poor                                Pertinent Vitals/Pain Pain Assessment: No/denies pain    Home Living Family/patient expects to be discharged to:: Private residence Living Arrangements: Spouse/significant other Available Help at Discharge: Family;Available PRN/intermittently (Wife works) Type of Home: House Home Access: Stairs to enter Entrance Stairs-Rails: Psychiatric nurse of Steps: 5 Home Layout: One level Home Equipment: Wheelchair - [employer] - 4 wheels;Shower seat Additional Comments: Has w/c and rollator in house and another set in car    Prior Function Level of Independence: Independent with assistive device(s);Needs assistance   Gait / Transfers Assistance Needed: Patient uses rollator and Lt prosthesis for gait  ADL's / Homemaking Assistance Needed: Wife assists with bathing - areas he cannot reach  Comments: Patient drives (automatic cars only); and cooks     Hand Dominance        Extremity/Trunk Assessment   Upper Extremity Assessment: Overall WFL for tasks assessed           Lower Extremity Assessment: RLE deficits/detail;LLE deficits/detail RLE Deficits / Details: Patient with knee pain.  For RFA on 01/11/16. LLE Deficits / Details: BKA - good knee ROM     Communication   Communication: No difficulties  Cognition Arousal/Alertness: Awake/alert Behavior During Therapy: WFL for tasks assessed/performed Overall Cognitive Status: Within Functional  Limits for tasks assessed                      General Comments      Exercises        Assessment/Plan    PT Assessment Patient needs continued PT services  PT Diagnosis Difficulty walking;Generalized weakness   PT Problem List Decreased strength;Decreased activity tolerance;Decreased balance;Decreased mobility;Cardiopulmonary status limiting activity;Obesity  PT Treatment Interventions DME instruction;Gait training;Stair training;Functional mobility  training;Therapeutic activities;Patient/family education   PT Goals (Current goals can be found in the Care Plan section) Acute Rehab PT Goals Patient Stated Goal: To have more intensive therapy to get stronger PT Goal Formulation: With patient Time For Goal Achievement: 01/02/16 Potential to Achieve Goals: Good    Frequency Min 3X/week   Barriers to discharge Decreased caregiver support Wife works.  Patient alone during day    Co-evaluation               End of Session   Activity Tolerance: Patient limited by fatigue (Increase HR) Patient left: in bed;with call bell/phone within reach (sitting EOB)           Time: [id number] PT Time Calculation (min) (ACUTE ONLY): 23 min   Charges:   PT Evaluation $PT Eval Moderate Complexity: 1 Procedure PT Treatments $Therapeutic Activity: 8-22 mins   PT G Codes:        Despina Pole 2016-01-21, 8:08 PM Carita Pian. Sanjuana Kava, [place] Pager [phone]

## 2015-12-26 NOTE — Progress Notes (Signed)
Assessment/Plan: 1. Acute GI Bleed ? gastritis:s/p PRBCs, ok to transfuse again 2. Chest pain: per CCM 3. Hyperkalemia: improved 4. ESRD- [facility]. HD today to get back on schedule. AVF working in [place] 5. ChronicHypotension-on midodrine pre HD 6. Anemia-  7. S/p VDRF  Subjective: Interval History: Feels better  Objective: Vital signs in last 24 hours: Temp:  [97 F (36.1 C)-98.1 F (36.7 C)] 98 F (36.7 C) (09/02 1144) Pulse Rate:  [88-103] 88 (09/02 0815) Resp:  [15-23] 17 (09/02 0455) BP: (89-153)/(34-74) 143/49 (09/02 0455) SpO2:  [93 %-100 %] 100 % (09/02 1144) Weight:  [129.5 kg (285 lb 7.9 oz)-138.1 kg (304 lb 7.3 oz)] 129.5 kg (285 lb 7.9 oz) (09/02 0455) Weight change: 7.9 kg (17 lb 6.7 oz)  Intake/Output from previous day: 09/01 0701 - 09/02 0700 In: 822 [P.O.:722; I.V.:100] Out: 2500  Intake/Output this shift: Total I/O In: 240 [P.O.:240] Out: -   General appearance: alert and cooperative Resp: clear to auscultation bilaterally Chest wall: no tenderness Cardio: regular rate and rhythm, S1, S2 normal, no murmur, click, rub or gallop  BKA, tr to 1+   Lab Results:  Recent Labs  12/25/15 0555 12/26/15 0306  WBC 6.2 7.9  HGB 6.7* 7.4*  HCT 21.4* 22.8*  PLT 160 178   BMET:  Recent Labs  12/25/15 1635 12/26/15 0306  NA 136 134*  K 3.9 3.4*  CL 99* 97*  CO2 25 27  GLUCOSE 190* 165*  BUN 25* 38*  CREATININE 3.54* 4.95*  CALCIUM 8.2* 8.3*   No results for input(s): PTH in the last 72 hours. Iron Studies: No results for input(s): IRON, TIBC, TRANSFERRIN, FERRITIN in the last 72 hours. Studies/Results: No results found.  Scheduled: . sodium chloride   Intravenous Once  . aspirin EC  81 mg Oral QHS  . atorvastatin  80 mg Oral QHS  . fluticasone  1 spray Each Nare Daily  . gabapentin  300 mg Oral TID  . insulin aspart  0-15 Units Subcutaneous Q4H  . insulin glargine  15 Units Subcutaneous QHS  . levothyroxine  100 mcg Oral QAC  breakfast  . loratadine  5 mg Oral BID  . mouth rinse  15 mL Mouth Rinse BID  . pantoprazole  40 mg Oral BID  . rOPINIRole  2 mg Oral Q supper  . sodium chloride flush  10-40 mL Intracatheter Q12H      LOS: 5 days   Brezlyn Manrique C 12/26/2015,12:26 PM

## 2015-12-26 NOTE — Progress Notes (Addendum)
PROGRESS NOTE    Dale [name]  [id number] DOB: 1965/08/30 DOA: 12/21/2015 PCP: [name], MD    Brief Narrative: Dale [name] is a 50 y.o. male with PMH as outlined below including ESRD (M/W/F).  Per nephrology service, pt is extremely compliant with HD never missing any sessions.  On 8/27 he developed L arm pain which he initially thought was due to his "fistula not working".  Pain resolved spontaneously therefore he did feel that he needed to be evaluated.  The following morning 8/28, when he woke up to go to HD, pain was back and was worse.  He therefore went to ED for further evaluation.  In ED, he was found to have K 7, SCr 12.37, BUN 219, Hgb 6.9.  He was taken for urgent HD and after roughly 1 hour, he began to have diaphoresis, persistent L arm pain which radiated into chest, SOB, worsening anxiety.   PCCM was called and pt was emergently intubated while in HD unit.  He was transferred to ICU for further evaluation / management.   Assessment & Plan:   Active Problems:   Gastrointestinal hemorrhage with melena   Encounter for intubation   Acute blood loss anemia   Uremia   Acute encephalopathy   Troponin level elevated   Encounter for central line placement   Anemia - suspect hemorrhagic due to GI bleed PPI oral BID.  Endoscopy 9-01; normal esophagus, erythematous mucosa in the cardia, gastric fundus and gastric body. No source for bleeding identified.  Received one unit PRBC 9-01. Hb at 7.4, discussed with renal ok to transfuse one unit    ESRD (M/W/F). Hyperkalemia - s/p emergent HD 8/28. Uremia - concern due to GI bleed. Hyponatremia. AGMA - uremia Improved with Dialysis.   Acute hypoxic respiratory failure. OSA with probable OHS. Improved. Patient had [facility] Friday 9-01/    Hypotension - suspect hemorrhagic due to GI bleed along with component hypovolemia - Received levophed.  BP improved.  Stress dose steroids. Tapering 25 BID. --will  discontinue hydrocortisone today.   Positive troponin;- suspect demand ischemia due to GI bleed.  Will consider NSTEMI given chest pain however vs demand ischemia ECHO; EF 65 %  Troponin: -7.9----18----5.4 Cardiology consulted for further evaluation.  Plan for stress test inpatient   Hx HTN, HLD, CAD, dCHF (Echo Nov 2016 with EF 60-65%, G2DD).  DM; SSI.  Started  low dose lantus.   Acute encephalopathy improved Hx fibromyalgia. Chronic pain.  On gabapentin Started on ropinirole. Alert oriented.  On oral dilaudid at home. Will order low dose IV dilaudid.        DVT prophylaxis: SCD.  Code Status: Full code.  Family Communication: Wife at bedside.  Disposition Plan: endoscopy today. He will need cardiology evaluation.     Consultants:   CCM admitted patient.   GI  Nephrology    Procedures:  Endoscopy 9-01  Antimicrobials:   none   Subjective: He is alert, Respiratory status stable. denies dyspnea.  complaing of ear fullness on the left, also complaining of his fibromyalgia pain    Objective: Vitals:   12/25/15 2000 12/26/15 0100 12/26/15 0455 12/26/15 0815  BP: (!) 145/44 (!) 106/34 (!) 143/49   Pulse: (!) 103 95 89 88  Resp: (!) 23 19 17    Temp: 98.1 F (36.7 C) 97.9 F (36.6 C) 97.7 F (36.5 C) 98 F (36.7 C)  TempSrc:    Oral  SpO2: 100% 99% 100% 93%  Weight:  129.5 kg (285 lb 7.9 oz)   Height:        Intake/Output Summary (Last 24 hours) at 12/26/15 1019 Last data filed at 12/26/15 0950  Gross per 24 hour  Intake              722 ml  Output             2500 ml  Net            -1778 ml   Filed Weights   12/25/15 1117 12/25/15 1526 12/26/15 0455  Weight: (!) 140.6 kg (309 lb 15.5 oz) (!) 138.1 kg (304 lb 7.3 oz) 129.5 kg (285 lb 7.9 oz)    Examination:  General exam: Appears calm and comfortable  Respiratory system: Clear to auscultation. Respiratory effort normal. Cardiovascular system: S1 & S2 heard, RRR. No JVD, murmurs,  rubs, gallops or clicks. No pedal edema. Gastrointestinal system: Abdomen is nondistended, soft and nontender. No organomegaly or masses felt. Normal bowel sounds heard. Central nervous system: Alert and oriented. No focal neurological deficits. Extremities: Symmetric 5 x 5 power. Skin: No rashes, lesions or ulcers Psychiatry: Judgement and insight appear normal. Mood & affect appropriate.     Data Reviewed: I have personally reviewed following labs and imaging studies  CBC:  Recent Labs Lab 12/21/15 0640  12/23/15 2020 12/24/15 0420 12/24/15 2156 12/25/15 0555 12/26/15 0306  WBC 12.8*  < > 13.1* 10.0 7.8 6.2 7.9  NEUTROABS 9.8*  --   --   --   --  4.6  --   HGB 6.9*  < > 8.1* 7.3* 7.1* 6.7* 7.4*  HCT 21.5*  < > 25.3* 23.0* 22.8* 21.4* 22.8*  MCV 101.9*  < > 96.6 97.0 98.7 99.1 95.0  PLT 259  < > 157 150 159 160 178  < > = values in this interval not displayed. Basic Metabolic Panel:  Recent Labs Lab 12/22/15 0516  12/23/15 0615  12/24/15 0420 12/24/15 1559 12/25/15 0555 12/25/15 1635 12/26/15 0306  NA 136  < > 137  < > 137 137 136 136 134*  K 3.9  < > 5.0  < > 4.6 4.4 4.6 3.9 3.4*  CL 100*  < > 104  < > 100* 101 97* 99* 97*  CO2 22  < > 21*  < > 24 24 24 25 27   GLUCOSE 182*  < > 185*  < > 247* 157* 275* 190* 165*  BUN 107*  < > 85*  < > 50* 60* 76* 25* 38*  CREATININE 6.36*  < > 6.67*  < > 5.46* 6.55* 7.87* 3.54* 4.95*  CALCIUM 7.7*  < > 8.1*  < > 8.1* 8.2* 8.4* 8.2* 8.3*  MG 2.2  --  2.3  --  2.0  --  2.5*  --  2.1  PHOS 4.4  < >  --   < > 7.2* 8.1* 9.4* 4.2 5.4*  < > = values in this interval not displayed. GFR: Estimated Creatinine Clearance: 23.4 mL/min (by C-G formula based on SCr of 4.95 mg/dL). Liver Function Tests:  Recent Labs Lab 12/21/15 0640  12/23/15 0615  12/24/15 0420 12/24/15 1559 12/25/15 0555 12/25/15 1635 12/26/15 0306  AST 16  --  39  --   --   --   --   --   --   ALT 14*  --  25  --   --   --   --   --   --  ALKPHOS 47  --  45   --   --   --   --   --   --   BILITOT 0.4  --  0.3  --   --   --   --   --   --   PROT 6.1*  --  5.1*  --   --   --   --   --   --   ALBUMIN 2.9*  < > 2.4*  < > 2.5* 2.5* 2.4* 2.5* 2.5*  < > = values in this interval not displayed.  Recent Labs Lab 12/21/15 0640  LIPASE 81*   No results for input(s): AMMONIA in the last 168 hours. Coagulation Profile:  Recent Labs Lab 12/21/15 1506  INR 1.20   Cardiac Enzymes:  Recent Labs Lab 12/21/15 0915 12/21/15 1720 12/21/15 1949 12/22/15 0840 12/23/15 0615  TROPONINI 0.26* 3.47* 7.94* 18.04* 5.43*   BNP (last 3 results) No results for input(s): PROBNP in the last 8760 hours. HbA1C: No results for input(s): HGBA1C in the last 72 hours. CBG:  Recent Labs Lab 12/25/15 1643 12/25/15 2022 12/26/15 0048 12/26/15 0500 12/26/15 0734  GLUCAP 177* 368* 221* 121* 102*   Lipid Profile: No results for input(s): CHOL, HDL, LDLCALC, TRIG, CHOLHDL, LDLDIRECT in the last 72 hours. Thyroid Function Tests:  Recent Labs  12/24/15 0501  TSH 2.489   Anemia Panel: No results for input(s): VITAMINB12, FOLATE, FERRITIN, TIBC, IRON, RETICCTPCT in the last 72 hours. Sepsis Labs:  Recent Labs Lab 12/21/15 1125  LATICACIDVEN 3.0*    Recent Results (from the past 240 hour(s))  MRSA PCR Screening     Status: None   Collection Time: 12/21/15 11:39 AM  Result Value Ref Range Status   MRSA by PCR NEGATIVE NEGATIVE Final    Comment:        The GeneXpert MRSA Assay (FDA approved for NASAL specimens only), is one component of a comprehensive MRSA colonization surveillance program. It is not intended to diagnose MRSA infection nor to guide or monitor treatment for MRSA infections.          Radiology Studies: No results found.      Scheduled Meds: . fentaNYL (SUBLIMAZE) injection  50 mcg Intravenous Once  . fluticasone  1 spray Each Nare Daily  . gabapentin  300 mg Oral TID  . hydrocortisone sod succinate (SOLU-CORTEF)  inj  25 mg Intravenous Q12H  . insulin aspart  0-15 Units Subcutaneous Q4H  . insulin glargine  15 Units Subcutaneous QHS  . levothyroxine  100 mcg Oral QAC breakfast  . loratadine  5 mg Oral BID  . mouth rinse  15 mL Mouth Rinse BID  . pantoprazole  40 mg Oral BID  . rOPINIRole  2 mg Oral Q supper  . sodium chloride flush  10-40 mL Intracatheter Q12H   Continuous Infusions: . dialysis replacement fluid (prismasate) 300 mL/hr at 12/22/15 0428  . dialysis replacement fluid (prismasate) 300 mL/hr at 12/22/15 0428  . dialysate (PRISMASATE) 2,000 mL/hr at 12/22/15 1513     LOS: 5 days    Time spent: 35 minutes.     Elmarie Shiley, MD [facility] Pager [phone]  If 7PM-7AM, please contact night-coverage [email or web address] Password [email or web address] 12/26/2015, 10:19 AM

## 2015-12-26 NOTE — Progress Notes (Signed)
DAILY PROGRESS NOTE  Subjective:  Consultation yesterday by Dr. Percival Spanish reviewed. He remains anemia- on dialysis. Troponin peak of 18.04 -> 5.43. Upper endoscopy failed to show a source of acute GIB. No chest pain complaints, did have left arm pain by fistula. Had no chest pain prior to his stents in the past.   Objective:  Temp:  [97 F (36.1 C)-98.1 F (36.7 C)] 98 F (36.7 C) (09/02 0815) Pulse Rate:  [88-103] 88 (09/02 0815) Resp:  [15-23] 17 (09/02 0455) BP: (89-153)/(34-74) 143/49 (09/02 0455) SpO2:  [93 %-100 %] 93 % (09/02 0815) Weight:  [285 lb 7.9 oz (129.5 kg)-304 lb 7.3 oz (138.1 kg)] 285 lb 7.9 oz (129.5 kg) (09/02 0455) Weight change: 17 lb 6.7 oz (7.9 kg)  Intake/Output from previous day: 09/01 0701 - 09/02 0700 In: 822 [P.O.:722; I.V.:100] Out: 2500   Intake/Output from this shift: Total I/O In: 240 [P.O.:240] Out: -   Medications: Current Facility-Administered Medications on File Prior to Encounter  Medication Dose Route Frequency Provider Last Rate Last Dose  . 0.9 %  sodium chloride infusion   Intravenous Continuous Angelia Mould, MD      . Chlorhexidine Gluconate Cloth 2 % PADS 6 each  6 each Topical Once Angelia Mould, MD       Current Outpatient Prescriptions on File Prior to Encounter  Medication Sig Dispense Refill  . acetaminophen (TYLENOL) 500 MG tablet Take 1,000 mg by mouth every 6 (six) hours as needed for moderate pain or headache.    . allopurinol (ZYLOPRIM) 100 MG tablet Take 200 mg by mouth daily.     [name] Kitchen aspirin EC 81 MG tablet Take 81 mg by mouth at bedtime.    [name] Kitchen atorvastatin (LIPITOR) 40 MG tablet Take 40 mg by mouth at bedtime.  0  . calcium acetate (PHOSLO) 667 MG capsule Take 1,334-2,001 mg by mouth See admin instructions. Take 3 capsules (2001 mg) by mouth with meals and 2 capsule (1361m ) with snacks    . diphenhydrAMINE (BENADRYL) 25 mg capsule Take 1-2 capsules (25-50 mg total) by mouth every 6 (six) hours as  needed for itching. (Patient taking differently: Take 50 mg by mouth 2 (two) times daily as needed for itching or sleep. ) 30 capsule 0  . fenofibrate (TRICOR) 48 MG tablet Take 1 tablet (48 mg total) by mouth daily. 30 tablet 0  . HYDROmorphone (DILAUDID) 2 MG tablet Take 1 tablet (2 mg total) by mouth 3 (three) times daily as needed for severe pain. (Patient taking differently: Take 2 mg by mouth 2 (two) times daily. Take 1 tablet (2 mg) by mouth daily at bedtime, may take an additional dose during the day as needed for severe pain) 20 tablet 0  . ibuprofen (ADVIL,MOTRIN) 200 MG tablet Take 400 mg by mouth 2 (two) times daily as needed for moderate pain.    . Insulin Human (INSULIN PUMP) 100 unit/ml SOLN Inject 1 each into the skin continuous. Humalog    . insulin lispro (HUMALOG) 100 UNIT/ML injection Inject 175 Units into the skin continuous. Up to 175 units continuous in pump,  Basal rates area as follows:  2400 1.5 units/hr, 0300 1.9 units/hr, 0800 1.45 units/hr, 1200 1.625 units/hr    . levothyroxine (SYNTHROID, LEVOTHROID) 100 MCG tablet Take 100 mcg by mouth at bedtime.     .[name] Kitchenloratadine (CLARITIN) 10 MG tablet Take 10 mg by mouth daily.    . metoCLOPramide (REGLAN) 5 MG tablet Take  5 mg by mouth 4 (four) times daily - after meals and at bedtime.    . midodrine (PROAMATINE) 10 MG tablet Take 20 mg by mouth every Monday, Wednesday, and Friday. On dialysis days    . multivitamin (RENA-VIT) TABS tablet Take 1 tablet by mouth daily.    . nitroGLYCERIN (NITROSTAT) 0.4 MG SL tablet Place 0.4 mg under the tongue every 5 (five) minutes as needed for chest pain.     . promethazine (PHENERGAN) 25 MG tablet Take 1 tablet (25 mg total) by mouth every 6 (six) hours as needed for nausea or vomiting. For nausea 30 tablet 0  . rOPINIRole (REQUIP) 2 MG tablet Take 2 mg by mouth daily with supper.     [name] Kitchen GLUCAGON EMERGENCY 1 MG injection Inject 1 mg into the muscle daily as needed (low blood sugar).     [name] Kitchen  HYDROmorphone (DILAUDID) 2 MG tablet Take 1 tablet (2 mg total) by mouth every 4 (four) hours as needed for severe pain. (Patient not taking: Reported on 10/14/2015) 30 tablet 0    Physical Exam: General appearance: alert, no distress, moderately obese and hoarse voice Neck: no carotid bruit and no JVD Lungs: diminished breath sounds bilaterally Heart: regular rate and rhythm, S1, S2 normal, no murmur, click, rub or gallop Abdomen: soft, non-tender; bowel sounds normal; no masses,  no organomegaly Extremities: left BKA/prosthesis Pulses: 2+ and symmetric Skin: Skin color, texture, turgor normal. No rashes or lesions Neurologic: Grossly normal Psych: Pleasant  Lab Results: Results for orders placed or performed during the hospital encounter of 12/21/15 (from the past 48 hour(s))  Glucose, capillary     Status: Abnormal   Collection Time: 12/24/15  3:16 PM  Result Value Ref Range   Glucose-Capillary 154 (H) 65 - 99 mg/dL   Comment 1 Document in Chart   Renal function panel (daily at 1600)     Status: Abnormal   Collection Time: 12/24/15  3:59 PM  Result Value Ref Range   Sodium 137 135 - 145 mmol/L   Potassium 4.4 3.5 - 5.1 mmol/L   Chloride 101 101 - 111 mmol/L   CO2 24 22 - 32 mmol/L   Glucose, Bld 157 (H) 65 - 99 mg/dL   BUN 60 (H) 6 - 20 mg/dL   Creatinine, Ser 6.55 (H) 0.61 - 1.24 mg/dL   Calcium 8.2 (L) 8.9 - 10.3 mg/dL   Phosphorus 8.1 (H) 2.5 - 4.6 mg/dL   Albumin 2.5 (L) 3.5 - 5.0 g/dL   GFR calc non Af Amer 9 (L) >60 mL/min   GFR calc Af Amer 10 (L) >60 mL/min    Comment: (NOTE) The eGFR has been calculated using the CKD EPI equation. This calculation has not been validated in all clinical situations. eGFR's persistently <60 mL/min signify possible Chronic Kidney Disease.    Anion gap 12 5 - 15  Glucose, capillary     Status: Abnormal   Collection Time: 12/24/15  8:02 PM  Result Value Ref Range   Glucose-Capillary 307 (H) 65 - 99 mg/dL  CBC     Status: Abnormal     Collection Time: 12/24/15  9:56 PM  Result Value Ref Range   WBC 7.8 4.0 - 10.5 K/uL   RBC 2.31 (L) 4.22 - 5.81 MIL/uL   Hemoglobin 7.1 (L) 13.0 - 17.0 g/dL   HCT 22.8 (L) 39.0 - 52.0 %   MCV 98.7 78.0 - 100.0 fL   MCH 30.7 26.0 - 34.0 pg  MCHC 31.1 30.0 - 36.0 g/dL   RDW 17.2 (H) 11.5 - 15.5 %   Platelets 159 150 - 400 K/uL  Glucose, capillary     Status: Abnormal   Collection Time: 12/24/15 11:33 PM  Result Value Ref Range   Glucose-Capillary 180 (H) 65 - 99 mg/dL  Glucose, capillary     Status: Abnormal   Collection Time: 12/25/15  3:58 AM  Result Value Ref Range   Glucose-Capillary 234 (H) 65 - 99 mg/dL  Renal function panel (daily at 0500)     Status: Abnormal   Collection Time: 12/25/15  5:55 AM  Result Value Ref Range   Sodium 136 135 - 145 mmol/L   Potassium 4.6 3.5 - 5.1 mmol/L   Chloride 97 (L) 101 - 111 mmol/L   CO2 24 22 - 32 mmol/L   Glucose, Bld 275 (H) 65 - 99 mg/dL   BUN 76 (H) 6 - 20 mg/dL   Creatinine, Ser 7.87 (H) 0.61 - 1.24 mg/dL   Calcium 8.4 (L) 8.9 - 10.3 mg/dL   Phosphorus 9.4 (H) 2.5 - 4.6 mg/dL   Albumin 2.4 (L) 3.5 - 5.0 g/dL   GFR calc non Af Amer 7 (L) >60 mL/min   GFR calc Af Amer 8 (L) >60 mL/min    Comment: (NOTE) The eGFR has been calculated using the CKD EPI equation. This calculation has not been validated in all clinical situations. eGFR's persistently <60 mL/min signify possible Chronic Kidney Disease.    Anion gap 15 5 - 15  Magnesium     Status: Abnormal   Collection Time: 12/25/15  5:55 AM  Result Value Ref Range   Magnesium 2.5 (H) 1.7 - 2.4 mg/dL  CBC with Differential/Platelet     Status: Abnormal   Collection Time: 12/25/15  5:55 AM  Result Value Ref Range   WBC 6.2 4.0 - 10.5 K/uL   RBC 2.16 (L) 4.22 - 5.81 MIL/uL   Hemoglobin 6.7 (LL) 13.0 - 17.0 g/dL    Comment: REPEATED TO VERIFY CRITICAL RESULT CALLED TO, READ BACK BY AND VERIFIED WITH: TEPE A RN AT 0641 ON 09.01.2017 BY COCHRANE S    HCT 21.4 (L) 39.0 - 52.0  %   MCV 99.1 78.0 - 100.0 fL   MCH 31.9 26.0 - 34.0 pg   MCHC 32.2 30.0 - 36.0 g/dL   RDW 17.3 (H) 11.5 - 15.5 %   Platelets 160 150 - 400 K/uL   Neutrophils Relative % 74 %   Neutro Abs 4.6 1.7 - 7.7 K/uL   Lymphocytes Relative 16 %   Lymphs Abs 1.0 0.7 - 4.0 K/uL   Monocytes Relative 6 %   Monocytes Absolute 0.4 0.1 - 1.0 K/uL   Eosinophils Relative 4 %   Eosinophils Absolute 0.3 0.0 - 0.7 K/uL   Basophils Relative 0 %   Basophils Absolute 0.0 0.0 - 0.1 K/uL  Glucose, capillary     Status: Abnormal   Collection Time: 12/25/15  7:32 AM  Result Value Ref Range   Glucose-Capillary 245 (H) 65 - 99 mg/dL   Comment 1 Document in Chart   Type and screen [place]     Status: None (Preliminary result)   Collection Time: 12/25/15  7:59 AM  Result Value Ref Range   ABO/RH(D) A POS    Antibody Screen NEG    Sample Expiration 12/28/2015    Unit Number [id number]    Blood Component Type RBC LR PHER1  Unit division 00    Status of Unit ISSUED    Transfusion Status OK TO TRANSFUSE    Crossmatch Result Compatible   Prepare RBC     Status: None   Collection Time: 12/25/15  7:59 AM  Result Value Ref Range   Order Confirmation ORDER PROCESSED BY BLOOD BANK   Renal function panel (daily at 1600)     Status: Abnormal   Collection Time: 12/25/15  4:35 PM  Result Value Ref Range   Sodium 136 135 - 145 mmol/L   Potassium 3.9 3.5 - 5.1 mmol/L   Chloride 99 (L) 101 - 111 mmol/L   CO2 25 22 - 32 mmol/L   Glucose, Bld 190 (H) 65 - 99 mg/dL   BUN 25 (H) 6 - 20 mg/dL   Creatinine, Ser 3.54 (H) 0.61 - 1.24 mg/dL    Comment: DIALYSIS   Calcium 8.2 (L) 8.9 - 10.3 mg/dL   Phosphorus 4.2 2.5 - 4.6 mg/dL   Albumin 2.5 (L) 3.5 - 5.0 g/dL   GFR calc non Af Amer 19 (L) >60 mL/min   GFR calc Af Amer 22 (L) >60 mL/min    Comment: (NOTE) The eGFR has been calculated using the CKD EPI equation. This calculation has not been validated in all clinical situations. eGFR's  persistently <60 mL/min signify possible Chronic Kidney Disease.    Anion gap 12 5 - 15  Glucose, capillary     Status: Abnormal   Collection Time: 12/25/15  4:43 PM  Result Value Ref Range   Glucose-Capillary 177 (H) 65 - 99 mg/dL   Comment 1 Notify RN    Comment 2 Document in Chart   Glucose, capillary     Status: Abnormal   Collection Time: 12/25/15  8:22 PM  Result Value Ref Range   Glucose-Capillary 368 (H) 65 - 99 mg/dL  Glucose, capillary     Status: Abnormal   Collection Time: 12/26/15 12:48 AM  Result Value Ref Range   Glucose-Capillary 221 (H) 65 - 99 mg/dL  Magnesium     Status: None   Collection Time: 12/26/15  3:06 AM  Result Value Ref Range   Magnesium 2.1 1.7 - 2.4 mg/dL  CBC     Status: Abnormal   Collection Time: 12/26/15  3:06 AM  Result Value Ref Range   WBC 7.9 4.0 - 10.5 K/uL   RBC 2.40 (L) 4.22 - 5.81 MIL/uL   Hemoglobin 7.4 (L) 13.0 - 17.0 g/dL   HCT 22.8 (L) 39.0 - 52.0 %   MCV 95.0 78.0 - 100.0 fL   MCH 30.8 26.0 - 34.0 pg   MCHC 32.5 30.0 - 36.0 g/dL   RDW 17.1 (H) 11.5 - 15.5 %   Platelets 178 150 - 400 K/uL  Renal function panel     Status: Abnormal   Collection Time: 12/26/15  3:06 AM  Result Value Ref Range   Sodium 134 (L) 135 - 145 mmol/L   Potassium 3.4 (L) 3.5 - 5.1 mmol/L   Chloride 97 (L) 101 - 111 mmol/L   CO2 27 22 - 32 mmol/L   Glucose, Bld 165 (H) 65 - 99 mg/dL   BUN 38 (H) 6 - 20 mg/dL   Creatinine, Ser 4.95 (H) 0.61 - 1.24 mg/dL   Calcium 8.3 (L) 8.9 - 10.3 mg/dL   Phosphorus 5.4 (H) 2.5 - 4.6 mg/dL   Albumin 2.5 (L) 3.5 - 5.0 g/dL   GFR calc non Af Amer 12 (L) >60 mL/min  GFR calc Af Amer 14 (L) >60 mL/min    Comment: (NOTE) The eGFR has been calculated using the CKD EPI equation. This calculation has not been validated in all clinical situations. eGFR's persistently <60 mL/min signify possible Chronic Kidney Disease.    Anion gap 10 5 - 15  Glucose, capillary     Status: Abnormal   Collection Time: 12/26/15  5:00 AM   Result Value Ref Range   Glucose-Capillary 121 (H) 65 - 99 mg/dL  Glucose, capillary     Status: Abnormal   Collection Time: 12/26/15  7:34 AM  Result Value Ref Range   Glucose-Capillary 102 (H) 65 - 99 mg/dL    Imaging: No results found.  Assessment:  Principal Problem:   Unstable angina (HCC) Active Problems:   S/P BKA (below knee amputation) (HCC)   Anemia   HTN (hypertension)   HLD (hyperlipidemia)   Gastrointestinal hemorrhage with melena   Encounter for intubation   Acute blood loss anemia   Uremia   Acute encephalopathy   Troponin level elevated   Encounter for central line placement   Plan:  1. [name] presented with left arm pain that radiated to his chest. He was found to have an elevated troponin over 18 in the setting of acute anemia. No clear evidence for upper GI bleed was noted. He is not aware of plans for colonoscopy at this time. He remains anemic and therefore is not a good candidate for cardiac catheterization/PCI at this point. He will need further evaluation of his elevated troponin. I would advise a Lexiscan Myoview tomorrow to evaluate for areas of ischemia that could be potential targets for intervention. Ultimately may need cardiac catheterization once his anemia is improved and stabilized. If there is no evidence of ongoing bleeding, I would recommend restarting daily aspirin. Restart home atorvastatin but increase to 80 mg daily.  Time Spent Directly with Patient:  15 minutes  Length of Stay:  LOS: 5 days   Pixie Casino, MD, [facility] Attending Cardiologist [place] 12/26/2015, 11:25 AM

## 2015-12-26 NOTE — Progress Notes (Signed)
Patient called RN and stated that night shift RN from last PM had lost his pain medication(po dilaudid).  Patient stated that he found a pill on the floor.  Online pill identifier used and pill found by patient was 2mg  dilaudid. Charge Nurse Myra Gianotti, RN notified and shown pill.  Pill disposed of in sharps.

## 2015-12-26 NOTE — Progress Notes (Addendum)
Patient getting blood transfusion.  Started complaining of "tightness in throat".  Charge Nurse notified.  Vitals Stable.  Blood stopped.  Notified Dr. Tyrell Antonio, she stated to stop blood and monitor patient.  Blood and tubing take to blood bank.  Reaction audit and blood slip placed to chart.

## 2015-12-27 ENCOUNTER — Inpatient Hospital Stay (HOSPITAL_COMMUNITY): Payer: Medicare Other

## 2015-12-27 ENCOUNTER — Encounter (HOSPITAL_COMMUNITY): Payer: Self-pay | Admitting: Gastroenterology

## 2015-12-27 DIAGNOSIS — I214 Non-ST elevation (NSTEMI) myocardial infarction: Secondary | ICD-10-CM

## 2015-12-27 DIAGNOSIS — I2510 Atherosclerotic heart disease of native coronary artery without angina pectoris: Secondary | ICD-10-CM

## 2015-12-27 LAB — GLUCOSE, CAPILLARY
Glucose-Capillary: 118 mg/dL — ABNORMAL HIGH (ref 65–99)
Glucose-Capillary: 151 mg/dL — ABNORMAL HIGH (ref 65–99)
Glucose-Capillary: 167 mg/dL — ABNORMAL HIGH (ref 65–99)
Glucose-Capillary: 172 mg/dL — ABNORMAL HIGH (ref 65–99)
Glucose-Capillary: 254 mg/dL — ABNORMAL HIGH (ref 65–99)
Glucose-Capillary: 290 mg/dL — ABNORMAL HIGH (ref 65–99)
Glucose-Capillary: 389 mg/dL — ABNORMAL HIGH (ref 65–99)
Glucose-Capillary: 391 mg/dL — ABNORMAL HIGH (ref 65–99)

## 2015-12-27 LAB — RENAL FUNCTION PANEL
Albumin: 2.5 g/dL — ABNORMAL LOW (ref 3.5–5.0)
Albumin: 2.8 g/dL — ABNORMAL LOW (ref 3.5–5.0)
Anion gap: 12 (ref 5–15)
Anion gap: 13 (ref 5–15)
BUN: 60 mg/dL — ABNORMAL HIGH (ref 6–20)
BUN: 68 mg/dL — ABNORMAL HIGH (ref 6–20)
CO2: 26 mmol/L (ref 22–32)
CO2: 23 mmol/L (ref 22–32)
Calcium: 8.5 mg/dL — ABNORMAL LOW (ref 8.9–10.3)
Calcium: 8.5 mg/dL — ABNORMAL LOW (ref 8.9–10.3)
Chloride: 95 mmol/L — ABNORMAL LOW (ref 101–111)
Chloride: 93 mmol/L — ABNORMAL LOW (ref 101–111)
Creatinine, Ser: 7.39 mg/dL — ABNORMAL HIGH (ref 0.61–1.24)
Creatinine, Ser: 8.49 mg/dL — ABNORMAL HIGH (ref 0.61–1.24)
GFR calc Af Amer: 9 mL/min — ABNORMAL LOW (ref >60)
GFR calc Af Amer: 8 mL/min — ABNORMAL LOW (ref >60)
GFR calc non Af Amer: 8 mL/min — ABNORMAL LOW (ref >60)
GFR calc non Af Amer: 6 mL/min — ABNORMAL LOW (ref >60)
Glucose, Bld: 156 mg/dL — ABNORMAL HIGH (ref 65–99)
Glucose, Bld: 301 mg/dL — ABNORMAL HIGH (ref 65–99)
Phosphorus: 7.9 mg/dL — ABNORMAL HIGH (ref 2.5–4.6)
Phosphorus: 8.7 mg/dL — ABNORMAL HIGH (ref 2.5–4.6)
Potassium: 3.7 mmol/L (ref 3.5–5.1)
Potassium: 4.7 mmol/L (ref 3.5–5.1)
Sodium: 133 mmol/L — ABNORMAL LOW (ref 135–145)
Sodium: 129 mmol/L — ABNORMAL LOW (ref 135–145)

## 2015-12-27 LAB — CBC
HCT: 22.5 % — ABNORMAL LOW (ref 39.0–52.0)
Hemoglobin: 7.3 g/dL — ABNORMAL LOW (ref 13.0–17.0)
MCH: 30.7 pg (ref 26.0–34.0)
MCHC: 32.4 g/dL (ref 30.0–36.0)
MCV: 94.5 fL (ref 78.0–100.0)
Platelets: 217 10*3/uL (ref 150–400)
RBC: 2.38 MIL/uL — ABNORMAL LOW (ref 4.22–5.81)
RDW: 16.6 % — ABNORMAL HIGH (ref 11.5–15.5)
WBC: 7.4 10*3/uL (ref 4.0–10.5)

## 2015-12-27 LAB — MAGNESIUM: Magnesium: 2.2 mg/dL (ref 1.7–2.4)

## 2015-12-27 LAB — PREPARE RBC (CROSSMATCH)

## 2015-12-27 MED ORDER — REGADENOSON 0.4 MG/5ML IV SOLN
INTRAVENOUS | Status: AC
  Filled 2015-12-27: qty 5

## 2015-12-27 MED ORDER — REGADENOSON 0.4 MG/5ML IV SOLN
0.4000 mg | Freq: Once | INTRAVENOUS | Status: DC
  Filled 2015-12-27: qty 5

## 2015-12-27 MED ORDER — SODIUM CHLORIDE 0.9 % IV SOLN: Freq: Once | INTRAVENOUS | Status: DC

## 2015-12-27 MED ORDER — IPRATROPIUM-ALBUTEROL 0.5-2.5 (3) MG/3ML IN SOLN
3.0000 mL | Freq: Four times a day (QID) | RESPIRATORY_TRACT | Status: DC
  Administered 2015-12-27: 3 mL via RESPIRATORY_TRACT
  Filled 2015-12-27: qty 3

## 2015-12-27 MED ORDER — TECHNETIUM TC 99M TETROFOSMIN IV KIT
10.0000 | PACK | Freq: Once | INTRAVENOUS | Status: AC | PRN
  Administered 2015-12-27: 10 via INTRAVENOUS

## 2015-12-27 MED ORDER — METHYLPREDNISOLONE SODIUM SUCC 40 MG IJ SOLR
40.0000 mg | Freq: Three times a day (TID) | INTRAMUSCULAR | Status: DC
  Administered 2015-12-27 (×3): 40 mg via INTRAVENOUS
  Filled 2015-12-27 (×3): qty 1

## 2015-12-27 MED ORDER — IPRATROPIUM-ALBUTEROL 0.5-2.5 (3) MG/3ML IN SOLN
3.0000 mL | Freq: Three times a day (TID) | RESPIRATORY_TRACT | Status: DC
  Administered 2015-12-27 – 2015-12-29 (×6): 3 mL via RESPIRATORY_TRACT
  Filled 2015-12-27 (×6): qty 3

## 2015-12-27 NOTE — Progress Notes (Signed)
Patient Name: Dale [name] Date of Encounter: 12/27/2015  Hospital Problem List     Principal Problem:   NSTEMI (non-ST elevated myocardial infarction) [facility]) Active Problems:   S/P BKA (below knee amputation) ([place])   Anemia   HTN (hypertension)   CAD (coronary artery disease)   HLD (hyperlipidemia)   Gastrointestinal hemorrhage with melena   Encounter for intubation   Acute blood loss anemia   Uremia   Acute encephalopathy   Troponin level elevated   Encounter for central line placement    Patient Profile     69m with complex PMH including CAD s/p prior PCIs, chronic diastolic CH, anemia of chronic disease, ESRD, hyperkalemia, GERd, fibromylagia, HLD, DM, OSA, pericardial effusion who presented to [facility] with left arm pain, potassium of 7, hemoglobin of 6.9. Rec'd multiple units of blood with persistent anemia. Had to be intubated in HD unit. Trop peak 18.  Subjective   C/o feeling somewhat SOB, hoarse, stridor.  Inpatient Medications    . regadenoson      . sodium chloride   Intravenous Once  . aspirin EC  81 mg Oral QHS  . atorvastatin  80 mg Oral QHS  . fluticasone  1 spray Each Nare Daily  . gabapentin  200 mg Oral TID  . insulin aspart  0-15 Units Subcutaneous Q4H  . insulin glargine  15 Units Subcutaneous QHS  . levothyroxine  100 mcg Oral QAC breakfast  . loratadine  5 mg Oral BID  . mouth rinse  15 mL Mouth Rinse BID  . pantoprazole  40 mg Oral BID  . regadenoson  0.4 mg Intravenous Once  . rOPINIRole  2 mg Oral Q supper  . sodium chloride flush  10-40 mL Intracatheter Q12H    Vital Signs    Vitals:   12/27/15 0030 12/27/15 0400 12/27/15 0737 12/27/15 0938  BP: (!) 124/47 (!) 120/58 118/61 (!) 166/67  Pulse: 90 90 88   Resp: (!) 21 (!) 21 19   Temp: 97.6 F (36.4 C) 97.9 F (36.6 C) 97.8 F (36.6 C)   TempSrc:   Oral   SpO2: 100% 100% 100%   Weight:  290 lb 12.8 oz (131.9 kg)    Height:        Intake/Output Summary (Last 24 hours) at  12/27/15 0954 Last data filed at 12/27/15 0809  Gross per 24 hour  Intake              630 ml  Output                0 ml  Net              630 ml   Filed Weights   12/25/15 1526 12/26/15 0455 12/27/15 0400  Weight: (!) 304 lb 7.3 oz (138.1 kg) 285 lb 7.9 oz (129.5 kg) 290 lb 12.8 oz (131.9 kg)    Physical Exam    General: Well developed morbidly obese pale WM Head: Normocephalic, atraumatic, sclera non-icteric, no xanthomas, nares are without discharge. Neck: JVP not elevated. Lungs: Hoarse voice, upper airway stridor, mild wheezing. Breathing is unlabored. Heart: RRR S1 S2 without murmurs, rubs, or gallops.  Abdomen: Soft, non-tender, non-distended with normoactive bowel sounds. No rebound/guarding. Extremities: No clubbing or cyanosis. L BKA Neuro: Alert and oriented X 3. Moves all extremities spontaneously. Psych:  Responds to questions appropriately with a normal affect.   Labs    CBC  Recent Labs  12/25/15 0555 12/26/15 0306  12/27/15 0350  WBC 6.2 7.9 7.4  NEUTROABS 4.6  --   --   HGB 6.7* 7.4* 7.3*  HCT 21.4* 22.8* 22.5*  MCV 99.1 95.0 94.5  PLT 160 178 [id number]   Basic Metabolic Panel  Recent Labs  12/26/15 0306 12/26/15 1420 12/27/15 0350  NA 134* 131* 133*  K 3.4* 3.6 3.7  CL 97* 95* 95*  CO2 27 26 26   GLUCOSE 165* 332* 156*  BUN 38* 49* 60*  CREATININE 4.95* 6.16* 7.39*  CALCIUM 8.3* 8.2* 8.5*  MG 2.1  --  2.2  PHOS 5.4* 6.8* 7.9*   Liver Function Tests  Recent Labs  12/26/15 1420 12/27/15 0350  ALBUMIN 2.5* 2.5*   Radiology    Dg Chest Port 1 View  Result Date: 12/23/2015 CLINICAL DATA:  Respiratory failure. EXAM: PORTABLE CHEST 1 VIEW COMPARISON:  12/22/2015. FINDINGS: Endotracheal tube, NG tube, left eye J line, large caliber right IJ line in stable position. Cardiomegaly with mild pulmonary venous congestion and interstitial prominence suggesting mild congestive heart failure. Small left pleural effusion. Chest is unchanged from prior  exam. No pneumothorax . IMPRESSION: 1. Lines and tubes in stable position. 2. Persistent changes of mild congestive heart failure with cardiomegaly, mild pulmonary venous congestion, mild interstitial edema and small left pleural effusion. No interim change from prior exam. Electronically Signed   By: Marcello Moores  Register   On: 12/23/2015 06:58   Dg Chest Port 1 View  Result Date: 12/22/2015 CLINICAL DATA:  Respiratory failure.  Assess endotracheal tube. EXAM: PORTABLE CHEST 1 VIEW COMPARISON:  Chest radiograph 12/21/2015 FINDINGS: Support lines and tubes are in unchanged position. The tip of the endotracheal tube remains at the level of the clavicular heads. Cardiomediastinal contours are unchanged. Persistent pulmonary vascular congestion. Small left pleural effusion. IMPRESSION: 1. Unchanged support lines and tubes. 2. Mild pulmonary edema. Electronically Signed   By: Ulyses Jarred M.D.   On: 12/22/2015 06:41   Dg Chest Port 1 View  Result Date: 12/21/2015 CLINICAL DATA:  Status post central line placement. EXAM: PORTABLE CHEST 1 VIEW COMPARISON:  Single-view of the chest earlier today. FINDINGS: Endotracheal tube is again seen with the tip at the top of the clavicular heads, well above the carina. Right IJ approach dialysis catheter remains in place. A new left IJ central venous catheter is seen with the tip projecting over the mid superior vena cava. No pneumothorax. There is cardiomegaly without edema. IMPRESSION: New left IJ catheter tip projects in the mid superior vena cava. Negative for pneumothorax. No other change. Electronically Signed   By: Inge Rise M.D.   On: 12/21/2015 15:07   Dg Chest Port 1 View  Result Date: 12/21/2015 CLINICAL DATA:  Intubated EXAM: PORTABLE CHEST 1 VIEW COMPARISON:  Chest radiograph from earlier today. FINDINGS: Endotracheal tube tip is 3.8 cm above the carina. Right internal jugular central venous catheter terminates at the cavoatrial junction. Enteric tube  enters stomach with the tip not seen on this image. Stable cardiomediastinal silhouette with cardiomegaly. No pneumothorax. Stable probable trace bilateral pleural effusions. Mild pulmonary edema, not definitely changed. Patchy left lung base opacity appears increased. IMPRESSION: 1. Well-positioned support structures as described. 2. Stable mild congestive heart failure . 3. Increased patchy left lung base opacity, favor atelectasis, cannot exclude a component of pneumonia or aspiration. Electronically Signed   By: Ilona Sorrel M.D.   On: 12/21/2015 11:26   Dg Chest Port 1 View  Result Date: 12/21/2015 CLINICAL DATA:  LEFT chest and  arm pain for 2 days. History of diabetes, renal failure, CHF. EXAM: PORTABLE CHEST 1 VIEW COMPARISON:  Chest radiograph March 09, 2015 FINDINGS: Cardiac silhouette is similarly enlarged. Mediastinal silhouette is nonsuspicious, tunneled dialysis catheter via RIGHT internal jugular venous approach with distal tips projecting cavoatrial junction. Strandy densities LEFT lung base. Pulmonary vasculature appears normal, no pleural effusion or focal consolidation. No pneumothorax. Large body habitus. Osseous structures are nonsuspicious. IMPRESSION: Similar cardiomegaly, LEFT lung base atelectasis. Electronically Signed   By: Elon Alas M.D.   On: 12/21/2015 06:49    Assessment & Plan   1. NSTEMI with hx of CAD - not felt to be a candidate for invasive eval with cath given anemia. May eventually be needed. Continue ASA as tolerated and statin. Do not see BB on board, suspect due to hypotension issues. Patient was seen down in nuc but noted to have new hoarseness and persistent stridor from yesterday. Had throat issues during transfusion yesterday. His hemoglobin is still low at 7.3. D/w Dr Debara Pickett who saw him down in nuc med - would prefer to defer nuc until patient is better transfused to avoid precipitating ischemia. I discussed with Dr. Tyrell Antonio who will revisit issue of  transfusion today - she also plans to call pulmonary.  2. Anemia - suspected due to GIB, s/p txf. Endoscopy 9-01; normal esophagus, erythematous mucosa in the cardia, gastric fundus and gastric body. No source for bleeding identified. Per IM. See above re: txf issue yesterday.  3. ESRD with hyperkalemia - per nephrology.  4. VDRF - improved.  Signed, Charlie Pitter, PA-C  12/27/2015, 9:54 AM

## 2015-12-27 NOTE — Progress Notes (Addendum)
PROGRESS NOTE    Dale [name]  [id number] DOB: 08/26/1965 DOA: 12/21/2015 PCP: [name], MD    Brief Narrative: Dale [name] is a 50 y.o. male with PMH as outlined below including ESRD (M/W/F).  Per nephrology service, pt is extremely compliant with HD never missing any sessions.  On 8/27 he developed L arm pain which he initially thought was due to his "fistula not working".  Pain resolved spontaneously therefore he did feel that he needed to be evaluated.  The following morning 8/28, when he woke up to go to HD, pain was back and was worse.  He therefore went to ED for further evaluation.  In ED, he was found to have K 7, SCr 12.37, BUN 219, Hgb 6.9.  He was taken for urgent HD and after roughly 1 hour, he began to have diaphoresis, persistent L arm pain which radiated into chest, SOB, worsening anxiety.   PCCM was called and pt was emergently intubated while in HD unit.  He was transferred to ICU for further evaluation / management.   Assessment & Plan:   Principal Problem:   NSTEMI (non-ST elevated myocardial infarction) ([place]) Active Problems:   S/P BKA (below knee amputation) ([place])   Anemia   HTN (hypertension)   CAD (coronary artery disease)   HLD (hyperlipidemia)   Gastrointestinal hemorrhage with melena   Encounter for intubation   Acute blood loss anemia   Uremia   Acute encephalopathy   Troponin level elevated   Encounter for central line placement   Anemia - suspect hemorrhagic due to GI bleed PPI oral BID.  Endoscopy 9-01; normal esophagus, erythematous mucosa in the cardia, gastric fundus and gastric body. No source for bleeding identified.  Received one unit PRBC 9-01. Unable to have PRBC yesterday due to feeling of throat tightness, after transfusion stop patient improved.  cardiology recommending 2 units, will discussed with renal, patient might need HD. Discussed with Dr Florene Glen, transfusion tomorrow prior to HD>   ESRD  (M/W/F). Hyperkalemia - s/p emergent HD 8/28. Uremia - concern due to GI bleed. Hyponatremia. AGMA - uremia Improved with Dialysis.   Acute hypoxic respiratory failure. OSA with probable OHS. Improved. Patient had [facility] Friday 9-01/  Complaining of dyspnea, wheezing trach area.  IV solumedrol, nebulizer, defer racemic epic to pulmonary. CCM consulted.   Hypotension - suspect hemorrhagic due to GI bleed along with component hypovolemia - Received levophed.  BP improved.   N-STEMI; Positive troponin;- suspect demand ischemia due to GI bleed.  Will consider NSTEMI given chest pain however vs demand ischemia ECHO; EF 65 %  Troponin: -7.9----18----5.4 Cardiology consulted for further evaluation.  Plan for stress test inpatient, unable to perform stress part today due to anemia and respiratory issues.     Hx HTN, HLD, CAD, dCHF (Echo Nov 2016 with EF 60-65%, G2DD).  DM; SSI.  Started  low dose lantus.   Acute encephalopathy improved Hx fibromyalgia. Chronic pain.  On gabapentin Started on ropinirole. Alert oriented.  On oral dilaudid at home.  IV dilaudid PRN.        DVT prophylaxis: SCD.  Code Status: Full code.  Family Communication: Wife 9-02 Disposition Plan: Pulmonary evaluation, Blood transfusion.     Consultants:   CCM admitted patient.   GI  Nephrology    Procedures:  Endoscopy 9-01  Antimicrobials:   none   Subjective: Complaining of difficulty breathing, air difficult to pass through his throat. Speaking full sentences.     Objective:  Vitals:   12/27/15 0030 12/27/15 0400 12/27/15 0737 12/27/15 0938  BP: (!) 124/47 (!) 120/58 118/61 (!) 166/67  Pulse: 90 90 88   Resp: (!) 21 (!) 21 19   Temp: 97.6 F (36.4 C) 97.9 F (36.6 C) 97.8 F (36.6 C)   TempSrc:   Oral   SpO2: 100% 100% 100%   Weight:  131.9 kg (290 lb 12.8 oz)    Height:        Intake/Output Summary (Last 24 hours) at 12/27/15 1047 Last data filed at 12/27/15 0809   Gross per 24 hour  Intake              450 ml  Output                0 ml  Net              450 ml   Filed Weights   12/25/15 1526 12/26/15 0455 12/27/15 0400  Weight: (!) 138.1 kg (304 lb 7.3 oz) 129.5 kg (285 lb 7.9 oz) 131.9 kg (290 lb 12.8 oz)    Examination:  General exam: Appears calm and comfortable  Respiratory system: Clear to auscultation. Respiratory effort normal. Cardiovascular system: S1 & S2 heard, RRR. No JVD, murmurs, rubs, gallops or clicks. No pedal edema. Gastrointestinal system: Abdomen is nondistended, soft and nontender. No organomegaly or masses felt. Normal bowel sounds heard. Central nervous system: Alert and oriented. No focal neurological deficits. Extremities: Symmetric 5 x 5 power. Skin: No rashes, lesions or ulcers Psychiatry: Judgement and insight appear normal. Mood & affect appropriate.     Data Reviewed: I have personally reviewed following labs and imaging studies  CBC:  Recent Labs Lab 12/21/15 0640  12/24/15 0420 12/24/15 2156 12/25/15 0555 12/26/15 0306 12/27/15 0350  WBC 12.8*  < > 10.0 7.8 6.2 7.9 7.4  NEUTROABS 9.8*  --   --   --  4.6  --   --   HGB 6.9*  < > 7.3* 7.1* 6.7* 7.4* 7.3*  HCT 21.5*  < > 23.0* 22.8* 21.4* 22.8* 22.5*  MCV 101.9*  < > 97.0 98.7 99.1 95.0 94.5  PLT 259  < > 150 159 160 178 217  < > = values in this interval not displayed. Basic Metabolic Panel:  Recent Labs Lab 12/23/15 0615  12/24/15 0420  12/25/15 0555 12/25/15 1635 12/26/15 0306 12/26/15 1420 12/27/15 0350  NA 137  < > 137  < > 136 136 134* 131* 133*  K 5.0  < > 4.6  < > 4.6 3.9 3.4* 3.6 3.7  CL 104  < > 100*  < > 97* 99* 97* 95* 95*  CO2 21*  < > 24  < > 24 25 27 26 26   GLUCOSE 185*  < > 247*  < > 275* 190* 165* 332* 156*  BUN 85*  < > 50*  < > 76* 25* 38* 49* 60*  CREATININE 6.67*  < > 5.46*  < > 7.87* 3.54* 4.95* 6.16* 7.39*  CALCIUM 8.1*  < > 8.1*  < > 8.4* 8.2* 8.3* 8.2* 8.5*  MG 2.3  --  2.0  --  2.5*  --  2.1  --  2.2  PHOS  --    < > 7.2*  < > 9.4* 4.2 5.4* 6.8* 7.9*  < > = values in this interval not displayed. GFR: Estimated Creatinine Clearance: 15.9 mL/min (by C-G formula based on SCr of 7.39 mg/dL). Liver Function Tests:  Recent Labs Lab 12/21/15 0640  12/23/15 0615  12/25/15 0555 12/25/15 1635 12/26/15 0306 12/26/15 1420 12/27/15 0350  AST 16  --  39  --   --   --   --   --   --   ALT 14*  --  25  --   --   --   --   --   --   ALKPHOS 47  --  45  --   --   --   --   --   --   BILITOT 0.4  --  0.3  --   --   --   --   --   --   PROT 6.1*  --  5.1*  --   --   --   --   --   --   ALBUMIN 2.9*  < > 2.4*  < > 2.4* 2.5* 2.5* 2.5* 2.5*  < > = values in this interval not displayed.  Recent Labs Lab 12/21/15 0640  LIPASE 81*   No results for input(s): AMMONIA in the last 168 hours. Coagulation Profile:  Recent Labs Lab 12/21/15 1506  INR 1.20   Cardiac Enzymes:  Recent Labs Lab 12/21/15 0915 12/21/15 1720 12/21/15 1949 12/22/15 0840 12/23/15 0615  TROPONINI 0.26* 3.47* 7.94* 18.04* 5.43*   BNP (last 3 results) No results for input(s): PROBNP in the last 8760 hours. HbA1C: No results for input(s): HGBA1C in the last 72 hours. CBG:  Recent Labs Lab 12/26/15 1614 12/26/15 2005 12/27/15 0032 12/27/15 0428 12/27/15 0725  GLUCAP 330* 315* 254* 151* 118*   Lipid Profile: No results for input(s): CHOL, HDL, LDLCALC, TRIG, CHOLHDL, LDLDIRECT in the last 72 hours. Thyroid Function Tests: No results for input(s): TSH, T4TOTAL, FREET4, T3FREE, THYROIDAB in the last 72 hours. Anemia Panel: No results for input(s): VITAMINB12, FOLATE, FERRITIN, TIBC, IRON, RETICCTPCT in the last 72 hours. Sepsis Labs:  Recent Labs Lab 12/21/15 1125  LATICACIDVEN 3.0*    Recent Results (from the past 240 hour(s))  MRSA PCR Screening     Status: None   Collection Time: 12/21/15 11:39 AM  Result Value Ref Range Status   MRSA by PCR NEGATIVE NEGATIVE Final    Comment:        The GeneXpert MRSA  Assay (FDA approved for NASAL specimens only), is one component of a comprehensive MRSA colonization surveillance program. It is not intended to diagnose MRSA infection nor to guide or monitor treatment for MRSA infections.          Radiology Studies: No results found.      Scheduled Meds: . sodium chloride   Intravenous Once  . aspirin EC  81 mg Oral QHS  . atorvastatin  80 mg Oral QHS  . fluticasone  1 spray Each Nare Daily  . gabapentin  200 mg Oral TID  . insulin aspart  0-15 Units Subcutaneous Q4H  . insulin glargine  15 Units Subcutaneous QHS  . ipratropium-albuterol  3 mL Nebulization Q6H  . levothyroxine  100 mcg Oral QAC breakfast  . loratadine  5 mg Oral BID  . mouth rinse  15 mL Mouth Rinse BID  . methylPREDNISolone (SOLU-MEDROL) injection  40 mg Intravenous TID  . pantoprazole  40 mg Oral BID  . rOPINIRole  2 mg Oral Q supper  . sodium chloride flush  10-40 mL Intracatheter Q12H   Continuous Infusions:     LOS: 6 days    Time spent: 35 minutes.  Elmarie Shiley, MD [facility] Pager [phone]  If 7PM-7AM, please contact night-coverage [email or web address] Password [email or web address] 12/27/2015, 10:47 AM

## 2015-12-27 NOTE — Progress Notes (Signed)
Name: Dale [name] MRN: [id number] DOB: 03/11/1966    ADMISSION DATE:  12/21/2015 CONSULTATION DATE:  8/28, reconsulted 9/3  REFERRING MD :  Tyrell Antonio ([facility])  CHIEF COMPLAINT:  Stridor   BRIEF PATIENT DESCRIPTION: 50 yo male with ,ultiple medical problems including ESRD on HD, OSA, DM, HTN, chronic dCHF initially admitted 8/28 with L arm pain which he attributed to his fistula.  In ER he was found to have K 7, Scr 12.37, Hgb 6.9.  He was taken for urgent HD but developed chest pain, diaphoresis, severe anxiety and was intubated emergently.  Intubation was reportedly difficult.  He improved quickly and was extubated 8/30 and tx to floor and to [facility] service.  On 9/3 PCCM called back for stridor.    STUDIES:  CXR 8/28 > cardiomegaly Echo 8/30>>>EF WNL, grade 2 diast  CULTURES: None.  ANTIBIOTICS: None.  SIGNIFICANT EVENTS: 8/28 > admitted with hyperkalemia, probable GI bleed, near arrest--> on HD ETT, shock 8/30 extubated, mild hypercapnia, HD done 9/3 c/o "throat tightness", stridor   LINES/TUBES: ETT 8/28 >>>8/30 Chronic rt IJ HD Left IJ 8/28 cvl>>>9/1  SUBJECTIVE:  C/o throat tightness, soreness when swallowing.  Does feel as though he is hoarse.  Denies any difficulty swallowing. Denies SOB.  Denies chest pain.   VITAL SIGNS: Temp:  [97.5 F (36.4 C)-98.3 F (36.8 C)] 97.9 F (36.6 C) (09/03 1149) Pulse Rate:  [88-98] 89 (09/03 1149) Resp:  [18-22] 18 (09/03 1149) BP: (118-166)/(47-77) 138/77 (09/03 1149) SpO2:  [100 %] 100 % (09/03 1149) Weight:  [131.9 kg (290 lb 12.8 oz)] 131.9 kg (290 lb 12.8 oz) (09/03 0400)  PHYSICAL EXAMINATION: General:  Pleasant obese male, NAD talking on phone  Neuro:  Awake, alert, appropriate, MAE  HEENT:  Mm moist, bruising L neck but no obvious swelling  Cardiovascular:  s1s2 rrr Lungs:  resps even non labored, hoarse voice, mild audible stridor  Abdomen:  Obese, soft, non tender  Musculoskeletal:  Warm and dry, L AKA     Recent Labs Lab 12/26/15 0306 12/26/15 1420 12/27/15 0350  NA 134* 131* 133*  K 3.4* 3.6 3.7  CL 97* 95* 95*  CO2 27 26 26   BUN 38* 49* 60*  CREATININE 4.95* 6.16* 7.39*  GLUCOSE 165* 332* 156*    Recent Labs Lab 12/25/15 0555 12/26/15 0306 12/27/15 0350  HGB 6.7* 7.4* 7.3*  HCT 21.4* 22.8* 22.5*  WBC 6.2 7.9 7.4  PLT 160 178 217   No results found.  ASSESSMENT / PLAN:  Stridor - with recent difficult intubation.  Stridor is mild.  No obvious airway compromise at this time.  Pt stable.  No difficulty swallowing, no SOB.    PLAN -  Agree with IV steroids  BD's  Hold off on racemic epi nebs  CT soft tissue neck  Continuous pulse ox    PCCM wiill f/u.    Nickolas Madrid, NP 12/27/2015  12:55 PM Pager: [phone] or [phone]  Attending note: I have seen and examined the patient with nurse practitioner/resident and agree with the note. History, labs and imaging reviewed.  50 Y/O with ESRD, MI, GI bleed.  He was intubated for 3 days. Extubated on 9/1  PCCM called to eval stridor On examination he c/o of throat tightness but does not appear to be is resp distress Mild stridor on auscultation of neck, no lung wheeze.  -Recc IV steroids and nebs as you are doing -We will hold off on epi nebs  given h/o recent MI and as he appears stable now -CT neck to eval soft tissue. -We will continue to monitor.  Marshell Garfinkel MD [facility] Pager [phone] If no answer or after 3pm call: [phone] 12/27/2015, 2:38 PM

## 2015-12-27 NOTE — Progress Notes (Signed)
Assessment/Plan: 1. Acute GI Bleed ? gastritis:s/p PRBCs, will transfuse again with HD in AM 2. Chest pain: per CCM 3. Hyperkalemia: improved 4. ESRD- [facility] Has TDC. AVF working in LUE; PRBCs in AM 5. ChronicHypotension-on midodrine pre HD 6. Anemia-  7. S/p VDRF   Subjective: Interval History: upper airway diff breathing post intubation  Objective: Vital signs in last 24 hours: Temp:  [97.6 F (36.4 C)-98.3 F (36.8 C)] 97.9 F (36.6 C) (09/03 1149) Pulse Rate:  [88-94] 89 (09/03 1149) Resp:  [18-22] 18 (09/03 1149) BP: (118-166)/(47-77) 138/77 (09/03 1149) SpO2:  [100 %] 100 % (09/03 1346) Weight:  [131.9 kg (290 lb 12.8 oz)] 131.9 kg (290 lb 12.8 oz) (09/03 0400) Weight change: -8.694 kg (-19 lb 2.7 oz)  Intake/Output from previous day: 09/02 0701 - 09/03 0700 In: 870 [P.O.:840; Blood:30] Out: 0  Intake/Output this shift: No intake/output data recorded.  General appearance: alert, cooperative and obese, large neck Back: symmetric, no curvature. ROM normal. No CVA tenderness. Resp: clear to auscultation bilaterally and upper airway stridor Cardio: regular rate and rhythm, S1, S2 normal, no murmur, click, rub or gallop Extremities: BKA AVF LUE  Lab Results:  Recent Labs  12/26/15 0306 12/27/15 0350  WBC 7.9 7.4  HGB 7.4* 7.3*  HCT 22.8* 22.5*  PLT 178 217   BMET:  Recent Labs  12/26/15 1420 12/27/15 0350  NA 131* 133*  K 3.6 3.7  CL 95* 95*  CO2 26 26  GLUCOSE 332* 156*  BUN 49* 60*  CREATININE 6.16* 7.39*  CALCIUM 8.2* 8.5*   No results for input(s): PTH in the last 72 hours. Iron Studies: No results for input(s): IRON, TIBC, TRANSFERRIN, FERRITIN in the last 72 hours. Studies/Results: No results found.  Scheduled: . sodium chloride   Intravenous Once  . aspirin EC  81 mg Oral QHS  . atorvastatin  80 mg Oral QHS  . fluticasone  1 spray Each Nare Daily  . gabapentin  200 mg Oral TID  . insulin aspart  0-15 Units Subcutaneous  Q4H  . insulin glargine  15 Units Subcutaneous QHS  . ipratropium-albuterol  3 mL Nebulization TID  . levothyroxine  100 mcg Oral QAC breakfast  . loratadine  5 mg Oral BID  . mouth rinse  15 mL Mouth Rinse BID  . methylPREDNISolone (SOLU-MEDROL) injection  40 mg Intravenous TID  . pantoprazole  40 mg Oral BID  . rOPINIRole  2 mg Oral Q supper  . sodium chloride flush  10-40 mL Intracatheter Q12H    LOS: 6 days   Afrika Brick C 12/27/2015,2:06 PM

## 2015-12-28 DIAGNOSIS — R061 Stridor: Secondary | ICD-10-CM

## 2015-12-28 DIAGNOSIS — D509 Iron deficiency anemia, unspecified: Secondary | ICD-10-CM

## 2015-12-28 DIAGNOSIS — I25110 Atherosclerotic heart disease of native coronary artery with unstable angina pectoris: Secondary | ICD-10-CM

## 2015-12-28 LAB — RENAL FUNCTION PANEL
Albumin: 2.7 g/dL — ABNORMAL LOW (ref 3.5–5.0)
Albumin: 2.7 g/dL — ABNORMAL LOW (ref 3.5–5.0)
Albumin: 2.9 g/dL — ABNORMAL LOW (ref 3.5–5.0)
Anion gap: 16 — ABNORMAL HIGH (ref 5–15)
Anion gap: 15 (ref 5–15)
Anion gap: 9 (ref 5–15)
BUN: 82 mg/dL — ABNORMAL HIGH (ref 6–20)
BUN: 88 mg/dL — ABNORMAL HIGH (ref 6–20)
BUN: 39 mg/dL — ABNORMAL HIGH (ref 6–20)
CO2: 21 mmol/L — ABNORMAL LOW (ref 22–32)
CO2: 22 mmol/L (ref 22–32)
CO2: 24 mmol/L (ref 22–32)
Calcium: 8.3 mg/dL — ABNORMAL LOW (ref 8.9–10.3)
Calcium: 8.4 mg/dL — ABNORMAL LOW (ref 8.9–10.3)
Calcium: 8.4 mg/dL — ABNORMAL LOW (ref 8.9–10.3)
Chloride: 90 mmol/L — ABNORMAL LOW (ref 101–111)
Chloride: 90 mmol/L — ABNORMAL LOW (ref 101–111)
Chloride: 96 mmol/L — ABNORMAL LOW (ref 101–111)
Creatinine, Ser: 9.77 mg/dL — ABNORMAL HIGH (ref 0.61–1.24)
Creatinine, Ser: 10.23 mg/dL — ABNORMAL HIGH (ref 0.61–1.24)
Creatinine, Ser: 5.56 mg/dL — ABNORMAL HIGH (ref 0.61–1.24)
GFR calc Af Amer: 6 mL/min — ABNORMAL LOW (ref >60)
GFR calc Af Amer: 6 mL/min — ABNORMAL LOW (ref >60)
GFR calc Af Amer: 13 mL/min — ABNORMAL LOW (ref >60)
GFR calc non Af Amer: 5 mL/min — ABNORMAL LOW (ref >60)
GFR calc non Af Amer: 5 mL/min — ABNORMAL LOW (ref >60)
GFR calc non Af Amer: 11 mL/min — ABNORMAL LOW (ref >60)
Glucose, Bld: 396 mg/dL — ABNORMAL HIGH (ref 65–99)
Glucose, Bld: 447 mg/dL — ABNORMAL HIGH (ref 65–99)
Glucose, Bld: 224 mg/dL — ABNORMAL HIGH (ref 65–99)
Phosphorus: 9.6 mg/dL — ABNORMAL HIGH (ref 2.5–4.6)
Phosphorus: 10 mg/dL — ABNORMAL HIGH (ref 2.5–4.6)
Phosphorus: 5.1 mg/dL — ABNORMAL HIGH (ref 2.5–4.6)
Potassium: 4.8 mmol/L (ref 3.5–5.1)
Potassium: 4.8 mmol/L (ref 3.5–5.1)
Potassium: 3.5 mmol/L (ref 3.5–5.1)
Sodium: 127 mmol/L — ABNORMAL LOW (ref 135–145)
Sodium: 127 mmol/L — ABNORMAL LOW (ref 135–145)
Sodium: 129 mmol/L — ABNORMAL LOW (ref 135–145)

## 2015-12-28 LAB — CBC
HCT: 21.8 % — ABNORMAL LOW (ref 39.0–52.0)
HCT: 20.8 % — ABNORMAL LOW (ref 39.0–52.0)
Hemoglobin: 7.4 g/dL — ABNORMAL LOW (ref 13.0–17.0)
Hemoglobin: 7.1 g/dL — ABNORMAL LOW (ref 13.0–17.0)
MCH: 31.5 pg (ref 26.0–34.0)
MCH: 31.4 pg (ref 26.0–34.0)
MCHC: 33.9 g/dL (ref 30.0–36.0)
MCHC: 34.1 g/dL (ref 30.0–36.0)
MCV: 92.8 fL (ref 78.0–100.0)
MCV: 92 fL (ref 78.0–100.0)
Platelets: 234 10*3/uL (ref 150–400)
Platelets: 240 10*3/uL (ref 150–400)
RBC: 2.35 MIL/uL — ABNORMAL LOW (ref 4.22–5.81)
RBC: 2.26 MIL/uL — ABNORMAL LOW (ref 4.22–5.81)
RDW: 16.1 % — ABNORMAL HIGH (ref 11.5–15.5)
RDW: 16.1 % — ABNORMAL HIGH (ref 11.5–15.5)
WBC: 5.8 10*3/uL (ref 4.0–10.5)
WBC: 5.9 10*3/uL (ref 4.0–10.5)

## 2015-12-28 LAB — GLUCOSE, CAPILLARY
Glucose-Capillary: 386 mg/dL — ABNORMAL HIGH (ref 65–99)
Glucose-Capillary: 437 mg/dL — ABNORMAL HIGH (ref 65–99)
Glucose-Capillary: 385 mg/dL — ABNORMAL HIGH (ref 65–99)
Glucose-Capillary: 160 mg/dL — ABNORMAL HIGH (ref 65–99)
Glucose-Capillary: 220 mg/dL — ABNORMAL HIGH (ref 65–99)
Glucose-Capillary: 261 mg/dL — ABNORMAL HIGH (ref 65–99)

## 2015-12-28 LAB — PREPARE RBC (CROSSMATCH)

## 2015-12-28 LAB — MAGNESIUM: Magnesium: 2.2 mg/dL (ref 1.7–2.4)

## 2015-12-28 LAB — HEMOGLOBIN AND HEMATOCRIT, BLOOD
HCT: 28.3 % — ABNORMAL LOW (ref 39.0–52.0)
Hemoglobin: 9.5 g/dL — ABNORMAL LOW (ref 13.0–17.0)

## 2015-12-28 LAB — TYPE AND SCREEN
ABO/RH(D): A POS
Antibody Screen: NEGATIVE

## 2015-12-28 MED ORDER — PREDNISONE 20 MG PO TABS
20.0000 mg | ORAL_TABLET | Freq: Two times a day (BID) | ORAL | Status: DC
  Administered 2015-12-28 – 2015-12-29 (×3): 20 mg via ORAL
  Filled 2015-12-28 (×3): qty 1

## 2015-12-28 MED ORDER — PENTAFLUOROPROP-TETRAFLUOROETH EX AERO: 1.0000 "application " | INHALATION_SPRAY | CUTANEOUS | Status: DC | PRN

## 2015-12-28 MED ORDER — HEPARIN SODIUM (PORCINE) 1000 UNIT/ML DIALYSIS: 1000.0000 [IU] | INTRAMUSCULAR | Status: DC | PRN

## 2015-12-28 MED ORDER — LIDOCAINE-PRILOCAINE 2.5-2.5 % EX CREA: 1.0000 "application " | TOPICAL_CREAM | CUTANEOUS | Status: DC | PRN

## 2015-12-28 MED ORDER — SODIUM CHLORIDE 0.9 % IV SOLN: 100.0000 mL | INTRAVENOUS | Status: DC | PRN

## 2015-12-28 MED ORDER — PANTOPRAZOLE SODIUM 40 MG PO TBEC
40.0000 mg | DELAYED_RELEASE_TABLET | Freq: Two times a day (BID) | ORAL | Status: DC
  Administered 2015-12-28 – 2016-01-04 (×14): 40 mg via ORAL
  Filled 2015-12-28 (×14): qty 1

## 2015-12-28 MED ORDER — SODIUM CHLORIDE 0.9 % IV SOLN: Freq: Once | INTRAVENOUS | Status: DC

## 2015-12-28 MED ORDER — SODIUM CHLORIDE 0.9 % IV SOLN
100.0000 mL | INTRAVENOUS | Status: DC | PRN
Start: 2015-12-28 — End: 2015-12-28

## 2015-12-28 MED ORDER — INSULIN GLARGINE 100 UNIT/ML ~~LOC~~ SOLN
25.0000 [IU] | Freq: Every day | SUBCUTANEOUS | Status: DC
  Administered 2015-12-28: 12.5 [IU] via SUBCUTANEOUS
  Filled 2015-12-28 (×3): qty 0.25

## 2015-12-28 MED ORDER — METHYLPREDNISOLONE SODIUM SUCC 40 MG IJ SOLR: 40.0000 mg | INTRAMUSCULAR | Status: DC

## 2015-12-28 MED ORDER — ALTEPLASE 2 MG IJ SOLR: 2.0000 mg | Freq: Once | INTRAMUSCULAR | Status: DC | PRN

## 2015-12-28 MED ORDER — LIDOCAINE HCL (PF) 1 % IJ SOLN: 5.0000 mL | INTRAMUSCULAR | Status: DC | PRN

## 2015-12-28 MED ORDER — HEPARIN SODIUM (PORCINE) 1000 UNIT/ML DIALYSIS: 20.0000 [IU]/kg | INTRAMUSCULAR | Status: DC | PRN

## 2015-12-28 NOTE — Progress Notes (Signed)
Physical Therapy Treatment Patient Details Name: Dale [name] MRN: [id number] DOB: 19-Oct-1965 Today's Date: 12/28/2015    History of Present Illness Patient is a 50 yo male admitted 12/21/15 with anemia, poss GIB, Hyperkalemia.  Patient less responsive and intubated 12/21/15.  Patient with anemia, acute encephalopathy, VDRF.  Extubated 12/23/15.      PMH:  ESRD on HD, morbid obesity, Lt BKA    PT Comments    Pt with increased activity tolerance but desaturation to 84% on RA with 30' ambulation. Pt returned to 3L O2 with gait but unable to achieve increased saturation until pt seated for 20 sec. At rest on RA sats 97-100% and even with bil LE HEP. Pt educated for safety with RW use, HEP, oxygen saturation and activity progression. Pt reports current distance would get him around in his home and that they keep microwave meals to aid in pt ability to care for himself while wife works. Will continue to follow  HR 99-119 with activity BP 140/56 end of session  Follow Up Recommendations  Home health PT;Supervision for mobility/OOB     Equipment Recommendations  None recommended by PT    Recommendations for Other Services       Precautions / Restrictions Precautions Precautions: Fall Other Brace/Splint: Lt BKA prosthesis Restrictions Weight Bearing Restrictions: No LLE Weight Bearing: Weight bearing as tolerated    Mobility  Bed Mobility Overal bed mobility: Modified Independent             General bed mobility comments: Increased time.  Use of rail  Transfers Overall transfer level: Needs assistance   Transfers: Sit to/from Stand Sit to Stand: Min guard         General transfer comment: cues for safety and proximity to walker. x 2 trials due to Right knee pain with initial standing  Ambulation/Gait Ambulation/Gait assistance: Min guard Ambulation Distance (Feet): 60 Feet Assistive device: Rolling walker (2 wheeled) Gait Pattern/deviations: Step-through  pattern;Decreased stride length;Trunk flexed   Gait velocity interpretation: Below normal speed for age/gender General Gait Details: cues for posture, position in RW, breathing technique and safety   Stairs            Wheelchair Mobility    Modified Rankin (Stroke Patients Only)       Balance Overall balance assessment: Needs assistance   Sitting balance-Leahy Scale: Good       Standing balance-Leahy Scale: Poor                      Cognition Arousal/Alertness: Awake/alert Behavior During Therapy: WFL for tasks assessed/performed Overall Cognitive Status: Within Functional Limits for tasks assessed                      Exercises General Exercises - Lower Extremity Long Arc Quad: AROM;Both;20 reps;Seated Hip Flexion/Marching: AROM;Both;Seated;20 reps    General Comments        Pertinent Vitals/Pain Pain Assessment: No/denies pain    Home Living                      Prior Function            PT Goals (current goals can now be found in the care plan section) Progress towards PT goals: Progressing toward goals    Frequency       PT Plan Current plan remains appropriate    Co-evaluation  End of Session Equipment Utilized During Treatment: Gait belt;Oxygen Activity Tolerance: Patient tolerated treatment well Patient left: in bed;with call bell/phone within reach     Time: 0843-0912 PT Time Calculation (min) (ACUTE ONLY): 29 min  Charges:  $Gait Training: 8-22 mins $Therapeutic Exercise: 8-22 mins                    G Codes:      Melford Aase 01/22/2016, 9:19 AM [name], [place]

## 2015-12-28 NOTE — Progress Notes (Signed)
PROGRESS NOTE    Dale [name]  [id number] DOB: 1966-04-05 DOA: 12/21/2015 PCP: [name], MD    Brief Narrative: Dale [name] is a 50 y.o. male with PMH as outlined below including ESRD (M/W/F).  Per nephrology service, pt is extremely compliant with HD never missing any sessions.  On 8/27 he developed L arm pain which he initially thought was due to his "fistula not working".  Pain resolved spontaneously therefore he did feel that he needed to be evaluated.  The following morning 8/28, when he woke up to go to HD, pain was back and was worse.  He therefore went to ED for further evaluation.  In ED, he was found to have K 7, SCr 12.37, BUN 219, Hgb 6.9.  He was taken for urgent HD and after roughly 1 hour, he began to have diaphoresis, persistent L arm pain which radiated into chest, SOB, worsening anxiety.   PCCM was called and pt was emergently intubated while in HD unit.  He was transferred to ICU for further evaluation / management.   Assessment & Plan:   Principal Problem:   NSTEMI (non-ST elevated myocardial infarction) ([place]) Active Problems:   S/P BKA (below knee amputation) ([place])   Anemia   HTN (hypertension)   Coronary artery disease involving native coronary artery of native heart with unstable angina pectoris ([place])   HLD (hyperlipidemia)   Gastrointestinal hemorrhage with melena   Encounter for intubation   Acute blood loss anemia   Uremia   Acute encephalopathy   Troponin level elevated   Encounter for central line placement   Stridor   Iron deficiency anemia   Anemia - suspect hemorrhagic due to GI bleed PPI oral BID.  Endoscopy 9-01; normal esophagus, erythematous mucosa in the cardia, gastric fundus and gastric body. No source for bleeding identified.  Received one unit PRBC 9-01. Transfuse 2 units today during HD.   ESRD (M/W/F). Hyperkalemia - s/p emergent HD 8/28. Uremia - concern due to GI bleed. Hyponatremia. AGMA - uremia Improved  with Dialysis.   Acute hypoxic respiratory failure. OSA with probable OHS. Improved. Patient had [facility] Friday 9-01/  Complaining of dyspnea, wheezing trach area.  Change IV solumedrol to oral prednisone. CT neck showed only tracheomalacia.  Appreciate Dr Melvyn Novas help/   Hypotension - suspect hemorrhagic due to GI bleed along with component hypovolemia - Received levophed.  BP improved.   N-STEMI; Positive troponin;- suspect demand ischemia due to GI bleed.  Will consider NSTEMI given chest pain however vs demand ischemia ECHO; EF 65 %  Troponin: -7.9----18----5.4 Cardiology consulted for further evaluation.  Plan for stress test tomorrow after blood transfusion   Hx HTN, HLD, CAD, dCHF (Echo Nov 2016 with EF 60-65%, G2DD).  DM; SSI.  Increase lantus to 25 units.  Taper steroids.   Acute encephalopathy improved Hx fibromyalgia. Chronic pain.  On gabapentin Started on ropinirole. Alert oriented.  On oral dilaudid at home.  IV dilaudid PRN.     DVT prophylaxis: SCD.  Code Status: Full code.  Family Communication: Wife 9-02 Disposition Plan: Pulmonary evaluation, Blood transfusion.     Consultants:   CCM admitted patient.   GI  Nephrology    Procedures:  Endoscopy 9-01  Antimicrobials:   none   Subjective: He is feeling better. Breathing better    Objective: Vitals:   12/28/15 0419 12/28/15 0735 12/28/15 0812 12/28/15 0915  BP: (!) 137/48 (!) 179/72  (!) 140/56  Pulse: 91 99  (!) 119  Resp: (!) 21 19    Temp: 98.3 F (36.8 C) 97.8 F (36.6 C)    TempSrc: Oral Oral    SpO2: 100% 100% 100% 98%  Weight: 133.4 kg (294 lb 1.6 oz)     Height:        Intake/Output Summary (Last 24 hours) at 12/28/15 1201 Last data filed at 12/28/15 1030  Gross per 24 hour  Intake              720 ml  Output                0 ml  Net              720 ml   Filed Weights   12/26/15 0455 12/27/15 0400 12/28/15 0419  Weight: 129.5 kg (285 lb 7.9 oz) 131.9 kg (290 lb  12.8 oz) 133.4 kg (294 lb 1.6 oz)    Examination:  General exam: Appears calm and comfortable  Respiratory system: Clear to auscultation. Respiratory effort normal. Cardiovascular system: S1 & S2 heard, RRR. No JVD, murmurs, rubs, gallops or clicks. No pedal edema. Gastrointestinal system: Abdomen is nondistended, soft and nontender. No organomegaly or masses felt. Normal bowel sounds heard. Central nervous system: Alert and oriented. No focal neurological deficits. Extremities: Symmetric 5 x 5 power. Skin: No rashes, lesions or ulcers Psychiatry: Judgement and insight appear normal. Mood & affect appropriate.     Data Reviewed: I have personally reviewed following labs and imaging studies  CBC:  Recent Labs Lab 12/25/15 0555 12/26/15 0306 12/27/15 0350 12/28/15 0259 12/28/15 0809  WBC 6.2 7.9 7.4 5.8 5.9  NEUTROABS 4.6  --   --   --   --   HGB 6.7* 7.4* 7.3* 7.4* 7.1*  HCT 21.4* 22.8* 22.5* 21.8* 20.8*  MCV 99.1 95.0 94.5 92.8 92.0  PLT 160 178 217 234 [id number]   Basic Metabolic Panel:  Recent Labs Lab 12/24/15 0420  12/25/15 0555  12/26/15 0306 12/26/15 1420 12/27/15 0350 12/27/15 1605 12/28/15 0259 12/28/15 0809  NA 137  < > 136  < > 134* 131* 133* 129* 127* 127*  K 4.6  < > 4.6  < > 3.4* 3.6 3.7 4.7 4.8 4.8  CL 100*  < > 97*  < > 97* 95* 95* 93* 90* 90*  CO2 24  < > 24  < > 27 26 26 23  21* 22  GLUCOSE 247*  < > 275*  < > 165* 332* 156* 301* 396* 447*  BUN 50*  < > 76*  < > 38* 49* 60* 68* 82* 88*  CREATININE 5.46*  < > 7.87*  < > 4.95* 6.16* 7.39* 8.49* 9.77* 10.23*  CALCIUM 8.1*  < > 8.4*  < > 8.3* 8.2* 8.5* 8.5* 8.3* 8.4*  MG 2.0  --  2.5*  --  2.1  --  2.2  --  2.2  --   PHOS 7.2*  < > 9.4*  < > 5.4* 6.8* 7.9* 8.7* 9.6* 10.0*  < > = values in this interval not displayed. GFR: Estimated Creatinine Clearance: 11.5 mL/min (by C-G formula based on SCr of 10.23 mg/dL). Liver Function Tests:  Recent Labs Lab 12/23/15 0615  12/26/15 1420 12/27/15 0350  12/27/15 1605 12/28/15 0259 12/28/15 0809  AST 39  --   --   --   --   --   --   ALT 25  --   --   --   --   --   --  ALKPHOS 45  --   --   --   --   --   --   BILITOT 0.3  --   --   --   --   --   --   PROT 5.1*  --   --   --   --   --   --   ALBUMIN 2.4*  < > 2.5* 2.5* 2.8* 2.7* 2.7*  < > = values in this interval not displayed. No results for input(s): LIPASE, AMYLASE in the last 168 hours. No results for input(s): AMMONIA in the last 168 hours. Coagulation Profile:  Recent Labs Lab 12/21/15 1506  INR 1.20   Cardiac Enzymes:  Recent Labs Lab 12/21/15 1720 12/21/15 1949 12/22/15 0840 12/23/15 0615  TROPONINI 3.47* 7.94* 18.04* 5.43*   BNP (last 3 results) No results for input(s): PROBNP in the last 8760 hours. HbA1C: No results for input(s): HGBA1C in the last 72 hours. CBG:  Recent Labs Lab 12/27/15 1953 12/27/15 2339 12/28/15 0422 12/28/15 0735 12/28/15 1125  GLUCAP 389* 391* 386* 437* 385*   Lipid Profile: No results for input(s): CHOL, HDL, LDLCALC, TRIG, CHOLHDL, LDLDIRECT in the last 72 hours. Thyroid Function Tests: No results for input(s): TSH, T4TOTAL, FREET4, T3FREE, THYROIDAB in the last 72 hours. Anemia Panel: No results for input(s): VITAMINB12, FOLATE, FERRITIN, TIBC, IRON, RETICCTPCT in the last 72 hours. Sepsis Labs: No results for input(s): PROCALCITON, LATICACIDVEN in the last 168 hours.  Recent Results (from the past 240 hour(s))  MRSA PCR Screening     Status: None   Collection Time: 12/21/15 11:39 AM  Result Value Ref Range Status   MRSA by PCR NEGATIVE NEGATIVE Final    Comment:        The GeneXpert MRSA Assay (FDA approved for NASAL specimens only), is one component of a comprehensive MRSA colonization surveillance program. It is not intended to diagnose MRSA infection nor to guide or monitor treatment for MRSA infections.          Radiology Studies: Ct Soft Tissue Neck Wo Contrast  Result Date:  12/27/2015 CLINICAL DATA:  50 year old male intubated a few days ago, sensation of throat closing up. Stridor. Initial encounter. EXAM: CT NECK WITHOUT CONTRAST TECHNIQUE: Multidetector CT imaging of the neck was performed following the standard protocol without intravenous contrast. COMPARISON:  Chest CTA 10/02/2014. FINDINGS: Large body habitus. Pharynx and larynx: The vocal cords are in close apposition (series 2, image 85) but this appears similar to the appearance on the 10/02/2014 chest CTA (series 5, image 5 of that exam). The supraglottic larynx and epiglottis are normal. The subglottic trachea does appear diminutive, but again similar to the prior chest CTA. Compare the Highly atelectatic appearance of the trachea on series 2, image 111 today to series 5, image 33 on the prior chest CTA. 7 Pharyngeal contours are within normal limits. Negative parapharyngeal and retropharyngeal spaces. Salivary glands: Negative sublingual space, submandibular glands and parotid glands. Thyroid: Diminutive common normal. Lymph nodes: Negative.  No cervical lymphadenopathy. Vascular: Right IJ approach dual lumen catheter partially visualized. Calcified distal cervical ICA atherosclerosis. Calcified ICA siphons and distal vertebral arteries. Vasculature not otherwise evaluated in the absence of IV contrast. Limited intracranial: Negative. Visualized orbits: Postoperative changes to both globes. Mastoids and visualized paranasal sinuses: Small mucous retention cyst right sphenoid sinus. Other paranasal sinuses are clear. Partial opacification of both tympanic cavities and mastoid air cells. The ossicles appear to remain intact. The external auditory canals appear fairly  normal. Skeleton:  No acute osseous abnormality identified. Upper chest: Stable visible lung apices. Partially visible chronic mediastinal lipomatosis. IMPRESSION: 1. Tracheomalacia. Highly atelectatic appearance of the trachea today, but not significantly  changed from a June Chest CTA (see series 2, image 111 today and series 5, image 33 on the 10/02/2014 chest CTA). 2. Likewise, the glottis appears stable since June. The supraglottic larynx is normal. No laryngeal trauma suspected. 3. Calcified atherosclerosis of the distal ICAs and vertebral arteries. Electronically Signed   By: Genevie Ann M.D.   On: 12/27/2015 18:46        Scheduled Meds: . sodium chloride   Intravenous Once  . sodium chloride   Intravenous Once  . aspirin EC  81 mg Oral QHS  . atorvastatin  80 mg Oral QHS  . fluticasone  1 spray Each Nare Daily  . gabapentin  200 mg Oral TID  . insulin aspart  0-15 Units Subcutaneous Q4H  . insulin glargine  15 Units Subcutaneous QHS  . ipratropium-albuterol  3 mL Nebulization TID  . levothyroxine  100 mcg Oral QAC breakfast  . loratadine  5 mg Oral BID  . mouth rinse  15 mL Mouth Rinse BID  . pantoprazole  40 mg Oral BID AC  . predniSONE  20 mg Oral BID WC  . rOPINIRole  2 mg Oral Q supper  . sodium chloride flush  10-40 mL Intracatheter Q12H   Continuous Infusions:     LOS: 7 days    Time spent: 35 minutes.     Elmarie Shiley, MD [facility] Pager [phone]  If 7PM-7AM, please contact night-coverage [email or web address] Password [email or web address] 12/28/2015, 12:01 PM

## 2015-12-28 NOTE — Care Management Note (Addendum)
Case Management Note  Patient Details  Name: Dale [name] MRN: [id number] Date of Birth: May 15, 1965  Subjective/Objective: Pt presented for Nstemi. Pt has HD MWF. Per pt he drives himself to HD. Pt is from home with wife. Pt has L BKA with prosthesis at bedside. Pt has DME:  Rollator, RW, Editor, commissioning. Pt using 02 in the room- staff RN to assess the need for home 02. Pt has used [facility] in the past. Plan for HD 12-28-15 with 2 units PRBC's and stress test.                  Action/Plan: CM did discuss the referral for [name]. Agency list provided to patient and pt chose [name] since he has used before. CM did make referral with Juliann Pulse. SOC to begin within 24-48 hours post d/c. Pt will need orders. No further needs at this time.  Expected Discharge Date:                  Expected Discharge Plan:  [place]  In-House Referral:  NA  Discharge planning Services  CM Consult  Post Acute Care Choice:  Home Health Choice offered to:  Patient  DME Arranged:  N/A DME Agency:  NA  HH Arranged:  RN, PT [place] Agency:  [facility]  Status of Service:  Completed, signed off  If discussed at [place] of Stay Meetings, dates discussed:    Additional Comments: 1709 Jacqlyn Krauss, RN,BSN. [phone] Pt was previously set up with [facility] for home care needs.  Bethena Roys, RN 12/28/2015, 10:30 AM

## 2015-12-28 NOTE — Progress Notes (Signed)
Name: Dale [name] MRN: [id number] DOB: Jul 02, 1965    ADMISSION DATE:  12/21/2015 CONSULTATION DATE:  8/28, reconsulted 9/3  REFERRING MD :  Tyrell Antonio ([facility])  CHIEF COMPLAINT:  Stridor   BRIEF PATIENT DESCRIPTION: 50 yo male with ,multiple medical problems including ESRD on HD, OSA, DM, HTN, chronic dCHF initially admitted 8/28 with L arm pain which he attributed to his fistula.  In ER he was found to have K 7, Scr 12.37, Hgb 6.9.  He was taken for urgent HD but developed chest pain, diaphoresis, severe anxiety and was intubated emergently.  Intubation was reportedly difficult.  He improved quickly and was extubated 8/30 and tx to floor and to [facility] service.  On 9/3 PCCM called back for stridor.    STUDIES:  CXR 8/28 > cardiomegaly Echo 8/30>>>EF WNL, grade 2 diast  CULTURES: None.  ANTIBIOTICS: None.  SIGNIFICANT EVENTS: 8/28 > admitted with hyperkalemia, probable GI bleed, near arrest--> on HD ETT, shock 8/30 extubated, mild hypercapnia, HD done 9/3 c/o "throat tightness", stridor   LINES/TUBES: ETT 8/28 >>>8/30 Chronic rt IJ HD Left IJ 8/28 cvl>>>9/1   SUBJECTIVE:  Some better/ main issue is sore throat/ min sob/stridor and no excess mucus  VITAL SIGNS: Temp:  [97.8 F (36.6 C)-98.5 F (36.9 C)] 97.8 F (36.6 C) (09/04 0735) Pulse Rate:  [89-119] 119 (09/04 0915) Resp:  [16-21] 19 (09/04 0735) BP: (132-179)/(44-77) 140/56 (09/04 0915) SpO2:  [98 %-100 %] 98 % (09/04 0915) Weight:  [294 lb 1.6 oz (133.4 kg)] 294 lb 1.6 oz (133.4 kg) (09/04 0419)   PHYSICAL EXAMINATION: General:  Pleasant obese male, NAD/ mildly hoarse  Neuro:  Awake, alert, appropriate, MAE  HEENT:  Mm moist, bruising L neck but no obvious swelling  Cardiovascular:  s1s2 rrr Lungs:  resps even non labored, mild audible stridor  Abdomen:  Obese, soft, non tender  Musculoskeletal:  Warm and dry, L AKA    Recent Labs Lab 12/27/15 1605 12/28/15 0259 12/28/15 0809  NA 129* 127* 127*   K 4.7 4.8 4.8  CL 93* 90* 90*  CO2 23 21* 22  BUN 68* 82* 88*  CREATININE 8.49* 9.77* 10.23*  GLUCOSE 301* 396* 447*    Recent Labs Lab 12/27/15 0350 12/28/15 0259 12/28/15 0809  HGB 7.3* 7.4* 7.1*  HCT 22.5* 21.8* 20.8*  WBC 7.4 5.8 5.9  PLT 217 234 240   Ct Soft Tissue Neck Wo Contrast  Result Date: 12/27/2015 CLINICAL DATA:  50 year old male intubated a few days ago, sensation of throat closing up. Stridor. Initial encounter. EXAM: CT NECK WITHOUT CONTRAST TECHNIQUE: Multidetector CT imaging of the neck was performed following the standard protocol without intravenous contrast. COMPARISON:  Chest CTA 10/02/2014. FINDINGS: Large body habitus. Pharynx and larynx: The vocal cords are in close apposition (series 2, image 85) but this appears similar to the appearance on the 10/02/2014 chest CTA (series 5, image 5 of that exam). The supraglottic larynx and epiglottis are normal. The subglottic trachea does appear diminutive, but again similar to the prior chest CTA. Compare the Highly atelectatic appearance of the trachea on series 2, image 111 today to series 5, image 33 on the prior chest CTA. 7 Pharyngeal contours are within normal limits. Negative parapharyngeal and retropharyngeal spaces. Salivary glands: Negative sublingual space, submandibular glands and parotid glands. Thyroid: Diminutive common normal. Lymph nodes: Negative.  No cervical lymphadenopathy. Vascular: Right IJ approach dual lumen catheter partially visualized. Calcified distal cervical ICA atherosclerosis. Calcified ICA siphons and  distal vertebral arteries. Vasculature not otherwise evaluated in the absence of IV contrast. Limited intracranial: Negative. Visualized orbits: Postoperative changes to both globes. Mastoids and visualized paranasal sinuses: Small mucous retention cyst right sphenoid sinus. Other paranasal sinuses are clear. Partial opacification of both tympanic cavities and mastoid air cells. The ossicles appear  to remain intact. The external auditory canals appear fairly normal. Skeleton:  No acute osseous abnormality identified. Upper chest: Stable visible lung apices. Partially visible chronic mediastinal lipomatosis. IMPRESSION: 1. Tracheomalacia. Highly atelectatic appearance of the trachea today, but not significantly changed from a June Chest CTA (see series 2, image 111 today and series 5, image 33 on the 10/02/2014 chest CTA). 2. Likewise, the glottis appears stable since June. The supraglottic larynx is normal. No laryngeal trauma suspected. 3. Calcified atherosclerosis of the distal ICAs and vertebral arteries. Electronically Signed   By: Genevie Ann M.D.   On: 12/27/2015 18:46    ASSESSMENT / PLAN:  Stridor - with recent difficult intubation.  Stridor is mild.  No obvious airway compromise at this time.  Pt stable.  No difficulty swallowing, no SOB.    PLAN -  Changed steroids to po rx 9/4  BD's  No need racemic epi nebs  CT soft tissue neck neg though does appear to have tracheomalacia and may benefit from flutter when coughing > ordered 9/4  Continuous pulse ox  Continue Max rx for GERD/ discouraged throat clearing/ use of sugarless candy but avoid mint/menthol produces   ENT eval prn but I doubt this will be needed    Christinia Gully, MD Pulmonary and [place] [phone] After 5:30 PM or weekends, call [phone]

## 2015-12-28 NOTE — Progress Notes (Signed)
Pt CBG is 437, SSI insulin given and MD notified.  Dale [name]

## 2015-12-28 NOTE — Progress Notes (Signed)
Patient Name: Dale [name] Date of Encounter: 12/28/2015    SUBJECTIVE: Just back from PT ambulation. O2 desaturation to the low 80s with ambulation. Completed rest portion of nuclear study yesterday.  TELEMETRY:  Normal sinus rhythm.: Vitals:   12/28/15 0419 12/28/15 0735 12/28/15 0812 12/28/15 0915  BP: (!) 137/48 (!) 179/72  (!) 140/56  Pulse: 91 99  (!) 119  Resp: (!) 21 19    Temp: 98.3 F (36.8 C) 97.8 F (36.6 C)    TempSrc: Oral Oral    SpO2: 100% 100% 100% 98%  Weight: 294 lb 1.6 oz (133.4 kg)     Height:        Intake/Output Summary (Last 24 hours) at 12/28/15 0941 Last data filed at 12/27/15 1851  Gross per 24 hour  Intake              480 ml  Output                0 ml  Net              480 ml   LABS: Basic Metabolic Panel:  Recent Labs  12/27/15 0350  12/28/15 0259 12/28/15 0809  NA 133*  < > 127* 127*  K 3.7  < > 4.8 4.8  CL 95*  < > 90* 90*  CO2 26  < > 21* 22  GLUCOSE 156*  < > 396* 447*  BUN 60*  < > 82* 88*  CREATININE 7.39*  < > 9.77* 10.23*  CALCIUM 8.5*  < > 8.3* 8.4*  MG 2.2  --  2.2  --   PHOS 7.9*  < > 9.6* 10.0*  < > = values in this interval not displayed. CBC:  Recent Labs  12/28/15 0259 12/28/15 0809  WBC 5.8 5.9  HGB 7.4* 7.1*  HCT 21.8* 20.8*  MCV 92.8 92.0  PLT 234 240   Radiology/Studies:  No new data. He initial resting nuclear study has been performed. A pharmacologic stress portion has not. Cardiology and internal medicine decided against completing the pharmacologic stress on 12/26/15 because of stridor and dyspnea. The plan was to dialyze, transfuse 2 units of blood, and then complete the stress imaging once the patient is more stable. This has not yet occurred. He is nothing by mouth for the nuclear study today. After speaking to all involved, we will continue to hole on the nuclear portion of the study until he can be dialyzed and transfused. This will push the nuclear study back at least until  tomorrow.  Physical Exam: Blood pressure (!) 140/56, pulse (!) 119, temperature 97.8 F (36.6 C), temperature source Oral, resp. rate 19, height 5\' 8"  (1.727 m), weight 294 lb 1.6 oz (133.4 kg), SpO2 98 %. Weight change: 3 lb 4.8 oz (1.497 kg)  Wt Readings from Last 3 Encounters:  12/28/15 294 lb 1.6 oz (133.4 kg)  10/14/15 281 lb (127.5 kg)  09/01/15 281 lb (127.5 kg)    Obese, no acute distress, just walk with rehabilitation and has some dyspnea on exertion with moderate desaturation. Chest is clear without wheezing or rales. No upper airway noises are observed on clinical exam. No gallop, rub, or murmur is heard on cardiac exam. Marked abdominal obesity. No tenderness. Left BKA and right foot partial amputation. Neurologically intact. Alert and oriented.  ASSESSMENT:  1. Non-ST elevation myocardial infarction with significant bump in troponin I. Clinical symptoms were that of left arm pain.  Post admission echocardiogram demonstrates normal LV function. He has had no recurrence of arm discomfort. 2. Post intubation for obtundation and hypotension. He has been at beta now for greater than 48 hours without recurrent significant respiratory distress. 3. Type 1 diabetes mellitus with end-stage kidney disease and chronic dialysis. To be dialyzed later today. 4. Severe anemia with hemoglobin this morning of 7.1. It was 7.4 yesterday. More recent hemoglobin determinations prior to admission have been in the 10 range.  Plan:  1. He has completed the resting phase of the myocardial perfusion imaging. After discussing with all parties involved, the pharmacologic stress imaging portion will be further delayed at least one day until the patient receives further dialysis and transfusion. 2. Discontinue nothing by mouth for today. 3. Reschedule nuclear study for a.m. with Lexiscan pharmacologic stress.  Signed, Belva Crome III 12/28/2015, 9:41 AM

## 2015-12-28 NOTE — Progress Notes (Addendum)
[facility] Progress Note   Subjective: no c/o today  Vitals:   12/28/15 0419 12/28/15 0735 12/28/15 0812 12/28/15 0915  BP: (!) 137/48 (!) 179/72  (!) 140/56  Pulse: 91 99  (!) 119  Resp: (!) 21 19    Temp: 98.3 F (36.8 C) 97.8 F (36.6 C)    TempSrc: Oral Oral    SpO2: 100% 100% 100% 98%  Weight: 133.4 kg (294 lb 1.6 oz)     Height:        Inpatient medications: . sodium chloride   Intravenous Once  . sodium chloride   Intravenous Once  . aspirin EC  81 mg Oral QHS  . atorvastatin  80 mg Oral QHS  . fluticasone  1 spray Each Nare Daily  . gabapentin  200 mg Oral TID  . insulin aspart  0-15 Units Subcutaneous Q4H  . insulin glargine  15 Units Subcutaneous QHS  . ipratropium-albuterol  3 mL Nebulization TID  . levothyroxine  100 mcg Oral QAC breakfast  . loratadine  5 mg Oral BID  . mouth rinse  15 mL Mouth Rinse BID  . pantoprazole  40 mg Oral BID AC  . predniSONE  20 mg Oral BID WC  . rOPINIRole  2 mg Oral Q supper  . sodium chloride flush  10-40 mL Intracatheter Q12H     sodium chloride, sodium chloride, sodium chloride, alteplase, diphenhydrAMINE, heparin, heparin, heparin, HYDROmorphone (DILAUDID) injection, HYDROmorphone, lidocaine (PF), lidocaine-prilocaine, ondansetron (ZOFRAN) IV, pentafluoroprop-tetrafluoroeth, phenol, promethazine **OR** promethazine, sodium chloride flush  Exam: No distress, calm Obese Chest clear bilat RRR no RG Abd marked obese, ntnd , no ascites LUA AVF +bruit/ IJ HD cath NF, Ox3 L BKA/ no LE edema  Dialysis: MWF [facility]    4h 44min  2/2.25 bath   127kg   Hep 7000  P4     IJ cath/ LUA AVF (have started using AVF at OP center, just got down to 16ga w 2 needles)       Assessment 1. Acute GI Bleed? gastritis:s/p PRBCs, will transfuse again with HD 2. Chest pain: per CCM 3. Hyperkalemia: improved 4. ESRD- [facility] Has TDC. Using AVF at center, [facility] w 2 needles 5. ChronicHypotension-on midodrine pre  HD 6. Anemia 7. S/p VDRF 8. DM2 - on insulin  Plan - HD today, 2u prbc w HD   Kelly Splinter MD [facility] pager [phone]    cell [phone] 12/28/2015, 11:54 AM    Recent Labs Lab 12/27/15 1605 12/28/15 0259 12/28/15 0809  NA 129* 127* 127*  K 4.7 4.8 4.8  CL 93* 90* 90*  CO2 23 21* 22  GLUCOSE 301* 396* 447*  BUN 68* 82* 88*  CREATININE 8.49* 9.77* 10.23*  CALCIUM 8.5* 8.3* 8.4*  PHOS 8.7* 9.6* 10.0*    Recent Labs Lab 12/23/15 0615  12/27/15 1605 12/28/15 0259 12/28/15 0809  AST 39  --   --   --   --   ALT 25  --   --   --   --   ALKPHOS 45  --   --   --   --   BILITOT 0.3  --   --   --   --   PROT 5.1*  --   --   --   --   ALBUMIN 2.4*  < > 2.8* 2.7* 2.7*  < > = values in this interval not displayed.  Recent Labs Lab 12/25/15 0555  12/27/15 0350 12/28/15 0259 12/28/15  0809  WBC 6.2  < > 7.4 5.8 5.9  NEUTROABS 4.6  --   --   --   --   HGB 6.7*  < > 7.3* 7.4* 7.1*  HCT 21.4*  < > 22.5* 21.8* 20.8*  MCV 99.1  < > 94.5 92.8 92.0  PLT 160  < > 217 234 240  < > = values in this interval not displayed. Iron/TIBC/Ferritin/ %Sat    Component Value Date/Time   IRON 57 05/11/2012 1551   TIBC 168 (L) 05/11/2012 1551   FERRITIN 350 (H) 05/11/2012 1551   IRONPCTSAT 34 05/11/2012 1551

## 2015-12-29 DIAGNOSIS — R0902 Hypoxemia: Secondary | ICD-10-CM

## 2015-12-29 DIAGNOSIS — J42 Unspecified chronic bronchitis: Secondary | ICD-10-CM

## 2015-12-29 LAB — NM MYOCAR MULTI W/SPECT W/WALL MOTION / EF
Exercise duration (min): 0 min
Exercise duration (sec): 0 s
LV dias vol: 11 mL (ref 62–150)
LV sys vol: 40 mL
MPHR: 170 {beats}/min
Percent HR: 61 %
RATE: 0.3
RPE: 0
Rest HR: 93 {beats}/min
SDS: 6
SRS: 13
SSS: 19
Stage 1 Grade: 0 %
Stage 1 HR: 93 {beats}/min
Stage 1 Speed: 0 mph
Stage 2 Grade: 0 %
Stage 2 HR: 93 {beats}/min
Stage 2 Speed: 0 mph
Stage 3 DBP: 66 mmHg
Stage 3 Grade: 0 %
Stage 3 HR: 98 {beats}/min
Stage 3 SBP: 160 mmHg
Stage 3 Speed: 0 mph
Stage 4 DBP: 43 mmHg
Stage 4 Grade: 0 %
Stage 4 HR: 96 {beats}/min
Stage 4 SBP: 166 mmHg
Stage 4 Speed: 0 mph
TID: 1.42

## 2015-12-29 LAB — RENAL FUNCTION PANEL
Albumin: 2.9 g/dL — ABNORMAL LOW (ref 3.5–5.0)
Albumin: 3 g/dL — ABNORMAL LOW (ref 3.5–5.0)
Anion gap: 11 (ref 5–15)
Anion gap: 15 (ref 5–15)
BUN: 54 mg/dL — ABNORMAL HIGH (ref 6–20)
BUN: 70 mg/dL — ABNORMAL HIGH (ref 6–20)
CO2: 23 mmol/L (ref 22–32)
CO2: 18 mmol/L — ABNORMAL LOW (ref 22–32)
Calcium: 8.5 mg/dL — ABNORMAL LOW (ref 8.9–10.3)
Calcium: 8.3 mg/dL — ABNORMAL LOW (ref 8.9–10.3)
Chloride: 96 mmol/L — ABNORMAL LOW (ref 101–111)
Chloride: 91 mmol/L — ABNORMAL LOW (ref 101–111)
Creatinine, Ser: 7.07 mg/dL — ABNORMAL HIGH (ref 0.61–1.24)
Creatinine, Ser: 8.42 mg/dL — ABNORMAL HIGH (ref 0.61–1.24)
GFR calc Af Amer: 9 mL/min — ABNORMAL LOW (ref >60)
GFR calc Af Amer: 8 mL/min — ABNORMAL LOW (ref >60)
GFR calc non Af Amer: 8 mL/min — ABNORMAL LOW (ref >60)
GFR calc non Af Amer: 7 mL/min — ABNORMAL LOW (ref >60)
Glucose, Bld: 201 mg/dL — ABNORMAL HIGH (ref 65–99)
Glucose, Bld: 601 mg/dL (ref 65–99)
Phosphorus: 7.1 mg/dL — ABNORMAL HIGH (ref 2.5–4.6)
Phosphorus: 8.1 mg/dL — ABNORMAL HIGH (ref 2.5–4.6)
Potassium: 4.3 mmol/L (ref 3.5–5.1)
Potassium: 5 mmol/L (ref 3.5–5.1)
Sodium: 130 mmol/L — ABNORMAL LOW (ref 135–145)
Sodium: 124 mmol/L — ABNORMAL LOW (ref 135–145)

## 2015-12-29 LAB — TYPE AND SCREEN
ABO/RH(D): A POS
Antibody Screen: NEGATIVE
Unit division: 0
Unit division: 0
Unit division: 0
Unit division: 0
Unit division: 0

## 2015-12-29 LAB — GLUCOSE, CAPILLARY
Glucose-Capillary: 216 mg/dL — ABNORMAL HIGH (ref 65–99)
Glucose-Capillary: 246 mg/dL — ABNORMAL HIGH (ref 65–99)
Glucose-Capillary: 359 mg/dL — ABNORMAL HIGH (ref 65–99)
Glucose-Capillary: 431 mg/dL — ABNORMAL HIGH (ref 65–99)
Glucose-Capillary: 513 mg/dL (ref 65–99)
Glucose-Capillary: 540 mg/dL (ref 65–99)
Glucose-Capillary: 541 mg/dL (ref 65–99)
Glucose-Capillary: 583 mg/dL (ref 65–99)

## 2015-12-29 LAB — CBC
HCT: 27.1 % — ABNORMAL LOW (ref 39.0–52.0)
Hemoglobin: 9 g/dL — ABNORMAL LOW (ref 13.0–17.0)
MCH: 30 pg (ref 26.0–34.0)
MCHC: 33.2 g/dL (ref 30.0–36.0)
MCV: 90.3 fL (ref 78.0–100.0)
Platelets: 244 10*3/uL (ref 150–400)
RBC: 3 MIL/uL — ABNORMAL LOW (ref 4.22–5.81)
RDW: 17.4 % — ABNORMAL HIGH (ref 11.5–15.5)
WBC: 9.1 10*3/uL (ref 4.0–10.5)

## 2015-12-29 LAB — MAGNESIUM: Magnesium: 2.1 mg/dL (ref 1.7–2.4)

## 2015-12-29 MED ORDER — INSULIN ASPART 100 UNIT/ML ~~LOC~~ SOLN
5.0000 [IU] | Freq: Once | SUBCUTANEOUS | Status: AC
  Administered 2015-12-29: 5 [IU] via SUBCUTANEOUS

## 2015-12-29 MED ORDER — TECHNETIUM TC 99M TETROFOSMIN IV KIT
30.0000 | PACK | Freq: Once | INTRAVENOUS | Status: AC | PRN
  Administered 2015-12-29: 30 via INTRAVENOUS

## 2015-12-29 MED ORDER — INSULIN GLARGINE 100 UNIT/ML ~~LOC~~ SOLN
27.0000 [IU] | Freq: Every day | SUBCUTANEOUS | Status: DC
  Filled 2015-12-29: qty 0.27

## 2015-12-29 MED ORDER — SODIUM CHLORIDE 0.9 % IV SOLN
INTRAVENOUS | Status: DC
  Administered 2015-12-29: 22:00:00 via INTRAVENOUS

## 2015-12-29 MED ORDER — SODIUM CHLORIDE 0.9 % IV SOLN: INTRAVENOUS | Status: AC

## 2015-12-29 MED ORDER — REGADENOSON 0.4 MG/5ML IV SOLN
0.4000 mg | Freq: Once | INTRAVENOUS | Status: AC
  Administered 2015-12-29: 0.4 mg via INTRAVENOUS
  Filled 2015-12-29: qty 5

## 2015-12-29 MED ORDER — REGADENOSON 0.4 MG/5ML IV SOLN
INTRAVENOUS | Status: AC
  Filled 2015-12-29: qty 5

## 2015-12-29 MED ORDER — SODIUM CHLORIDE 0.9 % IV SOLN
INTRAVENOUS | Status: DC
  Administered 2015-12-29: 4.5 [IU]/h via INTRAVENOUS
  Filled 2015-12-29: qty 2.5

## 2015-12-29 MED ORDER — NEPRO/CARBSTEADY PO LIQD
237.0000 mL | ORAL | Status: DC
  Administered 2016-01-02: 237 mL via ORAL
  Filled 2015-12-29 (×7): qty 237

## 2015-12-29 MED ORDER — DEXTROSE-NACL 5-0.45 % IV SOLN
INTRAVENOUS | Status: DC
  Administered 2015-12-30: 75 mL/h via INTRAVENOUS

## 2015-12-29 MED ORDER — INSULIN ASPART 100 UNIT/ML ~~LOC~~ SOLN
15.0000 [IU] | Freq: Once | SUBCUTANEOUS | Status: AC
  Administered 2015-12-29: 15 [IU] via SUBCUTANEOUS

## 2015-12-29 MED ORDER — IPRATROPIUM-ALBUTEROL 0.5-2.5 (3) MG/3ML IN SOLN
3.0000 mL | Freq: Two times a day (BID) | RESPIRATORY_TRACT | Status: DC
  Administered 2015-12-30 – 2015-12-31 (×4): 3 mL via RESPIRATORY_TRACT
  Filled 2015-12-29 (×5): qty 3

## 2015-12-29 NOTE — Progress Notes (Signed)
PT Cancellation Note  Patient Details Name: Dale [name] MRN: [id number] DOB: [birth year]   Cancelled Treatment:    Reason Eval/Treat Not Completed: Patient at procedure or test/unavailable (heading to Nuclear med)   Duncan Dull 12/29/2015, 10:04 AM Alben Deeds, PT DPT  [phone]

## 2015-12-29 NOTE — Progress Notes (Signed)
Name: Dale [name] MRN: [id number] DOB: January 13, 1966    ADMISSION DATE:  12/21/2015 CONSULTATION DATE:  8/28, reconsulted 9/3  REFERRING MD :  Tyrell Antonio ([facility])  CHIEF COMPLAINT:  Stridor   BRIEF PATIENT DESCRIPTION: 50 yo male with ,multiple medical problems including ESRD on HD, OSA, DM, HTN, chronic dCHF initially admitted 8/28 with L arm pain which he attributed to his fistula.  In ER he was found to have K 7, Scr 12.37, Hgb 6.9.  He was taken for urgent HD but developed chest pain, diaphoresis, severe anxiety and was intubated emergently.  Intubation was reportedly difficult.  He improved quickly and was extubated 8/30 and tx to floor and to [facility] service.  On 9/3 PCCM called back for stridor.    STUDIES:  CXR 8/28 > cardiomegaly Echo 8/30>>>EF WNL, grade 2 diast  CULTURES: None.  ANTIBIOTICS: None.  SIGNIFICANT EVENTS: 8/28 > admitted with hyperkalemia, probable GI bleed, near arrest--> on HD ETT, shock 8/30 extubated, mild hypercapnia, HD done 9/3 c/o "throat tightness", stridor   LINES/TUBES: ETT 8/28 >>>8/30 Chronic rt IJ HD Left IJ 8/28 cvl>>>9/1   SUBJECTIVE:  No events overnight, in cath lab.  VITAL SIGNS: Temp:  [97.4 F (36.3 C)-98.1 F (36.7 C)] 98 F (36.7 C) (09/05 0737) Pulse Rate:  [88-98] 96 (09/05 1102) Resp:  [10-23] 22 (09/05 0737) BP: (87-166)/(38-93) 166/43 (09/05 1102) SpO2:  [98 %-100 %] 100 % (09/05 0737) Weight:  [128 kg (282 lb 3 oz)-131 kg (288 lb 12.8 oz)] 131 kg (288 lb 12.8 oz) (09/05 0400)   PHYSICAL EXAMINATION: General:  Pleasant obese male, NAD/ mildly hoarse  Neuro:  Awake, alert, appropriate, MAE  HEENT:  Mm moist, bruising L neck but no obvious swelling  Cardiovascular:  s1s2 rrr Lungs:  resps even non labored, no audible stridor  Abdomen:  Obese, soft, non tender  Musculoskeletal:  Warm and dry, L AKA    Recent Labs Lab 12/28/15 0809 12/28/15 1831 12/29/15 0502  NA 127* 129* 130*  K 4.8 3.5 4.3  CL 90* 96*  96*  CO2 22 24 23   BUN 88* 39* 54*  CREATININE 10.23* 5.56* 7.07*  GLUCOSE 447* 224* 201*    Recent Labs Lab 12/28/15 0259 12/28/15 0809 12/28/15 2110 12/29/15 0502  HGB 7.4* 7.1* 9.5* 9.0*  HCT 21.8* 20.8* 28.3* 27.1*  WBC 5.8 5.9  --  9.1  PLT 234 240  --  244   Ct Soft Tissue Neck Wo Contrast  Result Date: 12/27/2015 CLINICAL DATA:  50 year old male intubated a few days ago, sensation of throat closing up. Stridor. Initial encounter. EXAM: CT NECK WITHOUT CONTRAST TECHNIQUE: Multidetector CT imaging of the neck was performed following the standard protocol without intravenous contrast. COMPARISON:  Chest CTA 10/02/2014. FINDINGS: Large body habitus. Pharynx and larynx: The vocal cords are in close apposition (series 2, image 85) but this appears similar to the appearance on the 10/02/2014 chest CTA (series 5, image 5 of that exam). The supraglottic larynx and epiglottis are normal. The subglottic trachea does appear diminutive, but again similar to the prior chest CTA. Compare the Highly atelectatic appearance of the trachea on series 2, image 111 today to series 5, image 33 on the prior chest CTA. 7 Pharyngeal contours are within normal limits. Negative parapharyngeal and retropharyngeal spaces. Salivary glands: Negative sublingual space, submandibular glands and parotid glands. Thyroid: Diminutive common normal. Lymph nodes: Negative.  No cervical lymphadenopathy. Vascular: Right IJ approach dual lumen catheter partially visualized. Calcified  distal cervical ICA atherosclerosis. Calcified ICA siphons and distal vertebral arteries. Vasculature not otherwise evaluated in the absence of IV contrast. Limited intracranial: Negative. Visualized orbits: Postoperative changes to both globes. Mastoids and visualized paranasal sinuses: Small mucous retention cyst right sphenoid sinus. Other paranasal sinuses are clear. Partial opacification of both tympanic cavities and mastoid air cells. The ossicles  appear to remain intact. The external auditory canals appear fairly normal. Skeleton:  No acute osseous abnormality identified. Upper chest: Stable visible lung apices. Partially visible chronic mediastinal lipomatosis. IMPRESSION: 1. Tracheomalacia. Highly atelectatic appearance of the trachea today, but not significantly changed from a June Chest CTA (see series 2, image 111 today and series 5, image 33 on the 10/02/2014 chest CTA). 2. Likewise, the glottis appears stable since June. The supraglottic larynx is normal. No laryngeal trauma suspected. 3. Calcified atherosclerosis of the distal ICAs and vertebral arteries. Electronically Signed   By: Genevie Ann M.D.   On: 12/27/2015 18:46   I reviewed CXR myself, not acute abnormalities noted.  ASSESSMENT / PLAN:  Stridor - with recent difficult intubation.  Stridor is resolved.  No obvious airway compromise at this time.  Pt stable.  No difficulty swallowing, no SOB.    PLAN -  Changed steroids to po rx 9/4 finish a total of 3 days.  BD's as ordered. No need racemic epi nebs. CT soft tissue neck neg though does appear to have tracheomalacia and may benefit from flutter when coughing > ordered 9/4. Continuous pulse ox. Titrate O2 for sat of 88-92%, if remains hypoxemic then will need an ambulatory desaturation study for O2 at home.  ENT eval prn but I doubt this will be needed   Discussed with PCCM-NP, PCCM will sign off, please call back if needed.  Rush Farmer, M.D. [facility] Pulmonary/Critical Care Medicine. Pager: [phone]. After hours pager: [phone].

## 2015-12-29 NOTE — Consult Note (Signed)
ENT CONSULT:  Reason for Consult: Hoarseness and mild shortness of breath Referring Physician: [facility]  Dale [name] is an 50 y.o. male.  HPI: Patient admitted to [facility] on 8/28 for symptoms of left arm pain, patient under went dialysis but developed progressive shortness of breath and chest pain and required emergent difficult intubation. Patient stable and extubated on 8/30 without significant difficulty. He was noted to have mild stridor, globus sensation and continued shortness of breath. He has a significant past medical history of severe gastroesophageal reflux, end-stage renal disease with hemodialysis, obstructive sleep apnea, diabetes and hypertension.  Past Medical History:  Diagnosis Date  . Anemia of chronic disease   . Arthritis    "hands, right knee" (03/19/2014)  . CAD (coronary artery disease)    a. Lexiscan Myoview (11/15):  Inf, apical cap and apical lateral ischemia, EF 56%, inf HK, Moderate Risk;  b. LHC (11/15):  mid to dist Dx 80%, mid RCA 99% (functional CTO), dist RCA 80% >> PCI: balloon angioplasty to mid RCA (could not deliver stent) - plan staged PCI with HSRA of RCA; Dx to be tx medically >> PCI: Rotoblator atherectomy/DES to [facility]  . Cholecystitis    a. 08/27/2011  . Chronic diastolic CHF (congestive heart failure) ([place])    a. Echo 3/13:  EF 55-60%;  b. Echo (11/15):  Mild LVH, EF 60-65%, no RWMA, Gr 1 DD, MAC, mild LAE, normal RVF, mild RAE;  c.  Echo 5/16:  severe LVH, EF 55-60%, no RWMA, Gr 1 DD, MAC, mild LAE  . Claustrophobia    when things get around his face.   . Difficult intubation    only once in 2015 at [facility] haven't had a problem since then  . ESRD (end stage renal disease) ([place])    a. 1995 s/p cadaveric transplant w/ susbequent failure after 18 yrs;  b.Dialysis initiated 07/2011 [facility] M-W-F  . Fibromyalgia   . GERD (gastroesophageal reflux disease)   . Gout    PMH  . History of blood transfusion   . HLD  (hyperlipidemia)   . Hypertension   . Hypothyroidism   . Inguinal lymphadenopathy    a. bilateral - s/p biopsy 07/2011  . Insulin dependent diabetes mellitus (HCC)    Type I  . OSA (obstructive sleep apnea)    adjustable bed  . Pericardial effusion    a.  Small by CT 08/22/11;  b.  Large by CT 08/27/11  . Pneumonia ~ 2007    Past Surgical History:  Procedure Laterality Date  . AMPUTATION OF REPLICATED TOES Right   . ANGIOPLASTY  04/09/2014   RCA   . APPENDECTOMY  ~ 2006  . ARTERIOVENOUS GRAFT PLACEMENT Left 1993?   forearm  . ARTERIOVENOUS GRAFT PLACEMENT Right 1993?   leg  . ARTERIOVENOUS GRAFT PLACEMENT Left    Thigh  . Arteriovenous Graft Removed Left    Thigh  . AV FISTULA PLACEMENT  07/29/2011   Procedure: ARTERIOVENOUS (AV) FISTULA CREATION;  Surgeon: Mal Misty, MD;  Location: [place];  Service: Vascular;  Laterality: Right;  Brachial cephalic  . AV FISTULA PLACEMENT Left 01/20/2015   Procedure: LIGATION OF RIGHT ARTERIOVENOUS FISTULA WITH EXCISION OF ANEURYSM;  Surgeon: Angelia Mould, MD;  Location: [place];  Service: Vascular;  Laterality: Left;  . AV FISTULA PLACEMENT Left 04/30/2015   Procedure: CREATION OF LEFT ARM BRACHIO-CEPHALIC ARTERIOVENOUS (AV) FISTULA ;  Surgeon: Angelia Mould, MD;  Location: [place];  Service: Vascular;  Laterality: Left;  . AV Graft PLACEMENT Left 1993?   "attempted one in my wrist; didn't take"  . BELOW KNEE LEG AMPUTATION Left 2010  . BELOW KNEE LEG AMPUTATION Left   . CARDIAC CATHETERIZATION  03/19/2014  . CARDIAC CATHETERIZATION  03/19/2014   Procedure: CORONARY BALLOON ANGIOPLASTY;  Surgeon: Kathleene Hazel, MD;  Location: [facility] CATH LAB;  Service: Cardiovascular;;  . CARPAL TUNNEL RELEASE Bilateral after 2005  . CARPAL TUNNEL RELEASE Bilateral   . CATARACT EXTRACTION W/ INTRAOCULAR LENS  IMPLANT, BILATERAL Bilateral 1990's  . COLONOSCOPY  08/29/2011   Procedure: COLONOSCOPY;  Surgeon: Beverley Fiedler, MD;  Location: [facility]  ENDOSCOPY;  Service: Gastroenterology;  Laterality: N/A;  . CORONARY ANGIOPLASTY WITH STENT PLACEMENT  04/09/2014       ptca/des mid lad   . ESOPHAGOGASTRODUODENOSCOPY N/A 12/25/2015   Procedure: ESOPHAGOGASTRODUODENOSCOPY (EGD);  Surgeon: [name] Form, MD;  Location: [facility] ENDOSCOPY;  Service: Endoscopy;  Laterality: N/A;  bedside  . EYE SURGERY Bilateral    cataract  . FEMORAL ARTERY REPAIR  04/09/2014   ANGIOSEAL  . INSERTION OF DIALYSIS CATHETER  08/01/2011   Procedure: INSERTION OF DIALYSIS CATHETER;  Surgeon: Larina Earthly, MD;  Location: [facility] OR;  Service: Vascular;  Laterality: Right;  insertion of dialysis catheter on right internal jugular vein  . INSERTION OF DIALYSIS CATHETER Right 01/20/2015   Procedure: INSERTION OF DIALYSIS CATHETER - RIGHT INTERNAL JUGULAR ;  Surgeon: Chuck Hint, MD;  Location: [facility] OR;  Service: Vascular;  Laterality: Right;  . KIDNEY TRANSPLANT  04/25/1993  . LEFT HEART CATHETERIZATION WITH CORONARY ANGIOGRAM N/A 03/19/2014   Procedure: LEFT HEART CATHETERIZATION WITH CORONARY ANGIOGRAM;  Surgeon: Kathleene Hazel, MD;  Location: [facility] CATH LAB;  Service: Cardiovascular;  Laterality: N/A;  . LYMPH NODE BIOPSY  08/10/2011   Procedure: LYMPH NODE BIOPSY;  Surgeon: Robyne Askew, MD;  Location: [facility] OR;  Service: General;  Laterality: Left;  left groin lymph biopsy  . NEUROPLASTY / TRANSPOSITION ULNAR NERVE AT ELBOW Left after 2005  . PERCUTANEOUS CORONARY ROTOBLATOR INTERVENTION (PCI-R) N/A 04/09/2014   Procedure: PERCUTANEOUS CORONARY ROTOBLATOR INTERVENTION (PCI-R);  Surgeon: Kathleene Hazel, MD;  Location: [facility] CATH LAB;  Service: Cardiovascular;  Laterality: N/A;  . PERIPHERAL VASCULAR CATHETERIZATION N/A 12/25/2014   Procedure: Upper Extremity Venography;  Surgeon: Fransisco Hertz, MD;  Location: [facility] INVASIVE CV LAB;  Service: Cardiovascular;  Laterality: N/A;  . PERITONEAL CATHETER INSERTION    . PERITONEAL CATHETER REMOVAL    . REVISON OF  ARTERIOVENOUS FISTULA Right 11/20/2014   Procedure: PLICATION / REVISION OF ARTERIOVENOUS FISTULA;  Surgeon: Chuck Hint, MD;  Location: [facility] OR;  Service: Vascular;  Laterality: Right;  . REVISON OF ARTERIOVENOUS FISTULA Left 09/01/2015   Procedure: SUPERFICIALIZATION OF LEFT ARM BRACHIOCEPHALIC ARTERIOVENOUS FISTULA;  Surgeon: Chuck Hint, MD;  Location: [facility] OR;  Service: Vascular;  Laterality: Left;  . SHOULDER ARTHROSCOPY Left ~ 2006   frozen  . TOE AMPUTATION Bilateral after 2005  . UMBILICAL HERNIA REPAIR  1990's  . VENOGRAM Right 07/28/2011   Procedure: VENOGRAM;  Surgeon: Fransisco Hertz, MD;  Location: [facility] CATH LAB;  Service: Cardiovascular;  Laterality: Right;  . VITRECTOMY Bilateral     Family History  Problem Relation Age of Onset  . Colon polyps Father   . Diabetes Father   . Hypertension Father   . Hypertension Mother   . Diabetes Mother   . Hyperlipidemia Mother   .  Breast cancer Maternal Aunt   . Cancer Maternal Aunt     Breast and Bone  . Malignant hyperthermia Neg Hx   . Heart attack Neg Hx   . Stroke Neg Hx     Social History:  reports that he has never smoked. He has never used smokeless tobacco. He reports that he drinks alcohol. He reports that he does not use drugs.  Allergies:  Allergies  Allergen Reactions  . Contrast Media [Iodinated Diagnostic Agents] Rash and Itching    Systemic itching and transient rash appearance within hours of receiving contrast  Systemic itching and transient rash appearance within hours of receiving contrast  Redman Syndrome  . Iodides Rash  . Ioxaglate Rash  . Rocephin [Ceftriaxone] Itching  . Amoxicillin Itching and Rash    .Has patient had a PCN reaction causing immediate rash, facial/tongue/throat swelling, SOB or lightheadedness with hypotension: Yes Has patient had a PCN reaction causing severe rash involving mucus membranes or skin necrosis: No Has patient had a PCN reaction that required hospitalization  No Has patient had a PCN reaction occurring within the last 10 years: No If all of the above answers are "NO", then may proceed with Cephalosporin use. Has patient had a PCN reaction causing immediate rash, facial/tongue/throat swelling, SOB or lightheadedness with hypotension: Yes Has patient had a PCN reaction causing severe rash involving mucus membranes or skin necrosis: No Has patient had a PCN reaction that required hospitalization No Has patient had a PCN reaction occurring within the last 10 years: No If all of the above answers are "NO", then may proceed with Cephalosporin use.  . Buprenorphine Hcl Nausea Only and Hives  . Clopidogrel Rash  . Enalapril Rash    cough  . Hydrocodone Rash  . Morphine Nausea Only, Rash and Hives  . Morphine And Related Hives and Nausea Only  . Adhesive [Tape] Other (See Comments) and Itching    Blisters on arm from adhesive tape at dialysis Blisters on arm from adhesive tape at dialysis  . Ciprofloxacin Itching and Rash  . Clindamycin/Lincomycin Hives and Itching  . Codeine Hives, Itching and Rash  . Doxycycline Rash  . Enalaprilat     Other reaction(s): Cough (ALLERGY/intolerance)  . Levofloxacin Hives and Itching  . Lincomycin Hcl Itching and Hives  . Mobic [Meloxicam] Hives and Rash  . Oxycodone Rash  . Plavix [Clopidogrel Bisulfate] Rash  . Vasotec Cough    Medications: I have reviewed the patient's current medications.  Results for orders placed or performed during the hospital encounter of 12/21/15 (from the past 48 hour(s))  Glucose, capillary     Status: Abnormal   Collection Time: 12/27/15  7:53 PM  Result Value Ref Range   Glucose-Capillary 389 (H) 65 - 99 mg/dL  Glucose, capillary     Status: Abnormal   Collection Time: 12/27/15 11:39 PM  Result Value Ref Range   Glucose-Capillary 391 (H) 65 - 99 mg/dL   Comment 1 Notify RN   Magnesium     Status: None   Collection Time: 12/28/15  2:59 AM  Result Value Ref Range    Magnesium 2.2 1.7 - 2.4 mg/dL  CBC     Status: Abnormal   Collection Time: 12/28/15  2:59 AM  Result Value Ref Range   WBC 5.8 4.0 - 10.5 K/uL   RBC 2.35 (L) 4.22 - 5.81 MIL/uL   Hemoglobin 7.4 (L) 13.0 - 17.0 g/dL   HCT 21.8 (L) 39.0 - 52.0 %  MCV 92.8 78.0 - 100.0 fL   MCH 31.5 26.0 - 34.0 pg   MCHC 33.9 30.0 - 36.0 g/dL   RDW 16.1 (H) 11.5 - 15.5 %   Platelets 234 150 - 400 K/uL  Renal function panel     Status: Abnormal   Collection Time: 12/28/15  2:59 AM  Result Value Ref Range   Sodium 127 (L) 135 - 145 mmol/L   Potassium 4.8 3.5 - 5.1 mmol/L   Chloride 90 (L) 101 - 111 mmol/L   CO2 21 (L) 22 - 32 mmol/L   Glucose, Bld 396 (H) 65 - 99 mg/dL   BUN 82 (H) 6 - 20 mg/dL   Creatinine, Ser 9.77 (H) 0.61 - 1.24 mg/dL   Calcium 8.3 (L) 8.9 - 10.3 mg/dL   Phosphorus 9.6 (H) 2.5 - 4.6 mg/dL   Albumin 2.7 (L) 3.5 - 5.0 g/dL   GFR calc non Af Amer 5 (L) >60 mL/min   GFR calc Af Amer 6 (L) >60 mL/min    Comment: (NOTE) The eGFR has been calculated using the CKD EPI equation. This calculation has not been validated in all clinical situations. eGFR's persistently <60 mL/min signify possible Chronic Kidney Disease.    Anion gap 16 (H) 5 - 15  Glucose, capillary     Status: Abnormal   Collection Time: 12/28/15  4:22 AM  Result Value Ref Range   Glucose-Capillary 386 (H) 65 - 99 mg/dL   Comment 1 Notify RN   Glucose, capillary     Status: Abnormal   Collection Time: 12/28/15  7:35 AM  Result Value Ref Range   Glucose-Capillary 437 (H) 65 - 99 mg/dL   Comment 1 Notify RN    Comment 2 Document in Chart   Renal function panel     Status: Abnormal   Collection Time: 12/28/15  8:09 AM  Result Value Ref Range   Sodium 127 (L) 135 - 145 mmol/L   Potassium 4.8 3.5 - 5.1 mmol/L   Chloride 90 (L) 101 - 111 mmol/L   CO2 22 22 - 32 mmol/L   Glucose, Bld 447 (H) 65 - 99 mg/dL   BUN 88 (H) 6 - 20 mg/dL   Creatinine, Ser 10.23 (H) 0.61 - 1.24 mg/dL   Calcium 8.4 (L) 8.9 - 10.3 mg/dL    Phosphorus 10.0 (H) 2.5 - 4.6 mg/dL   Albumin 2.7 (L) 3.5 - 5.0 g/dL   GFR calc non Af Amer 5 (L) >60 mL/min   GFR calc Af Amer 6 (L) >60 mL/min    Comment: (NOTE) The eGFR has been calculated using the CKD EPI equation. This calculation has not been validated in all clinical situations. eGFR's persistently <60 mL/min signify possible Chronic Kidney Disease.    Anion gap 15 5 - 15  CBC     Status: Abnormal   Collection Time: 12/28/15  8:09 AM  Result Value Ref Range   WBC 5.9 4.0 - 10.5 K/uL   RBC 2.26 (L) 4.22 - 5.81 MIL/uL   Hemoglobin 7.1 (L) 13.0 - 17.0 g/dL   HCT 20.8 (L) 39.0 - 52.0 %   MCV 92.0 78.0 - 100.0 fL   MCH 31.4 26.0 - 34.0 pg   MCHC 34.1 30.0 - 36.0 g/dL   RDW 16.1 (H) 11.5 - 15.5 %   Platelets 240 150 - 400 K/uL  Glucose, capillary     Status: Abnormal   Collection Time: 12/28/15 11:25 AM  Result Value Ref Range  Glucose-Capillary 385 (H) 65 - 99 mg/dL   Comment 1 Notify RN    Comment 2 Document in Chart   Prepare RBC     Status: None   Collection Time: 12/28/15  5:00 PM  Result Value Ref Range   Order Confirmation ORDER PROCESSED BY BLOOD BANK   Glucose, capillary     Status: Abnormal   Collection Time: 12/28/15  5:00 PM  Result Value Ref Range   Glucose-Capillary 160 (H) 65 - 99 mg/dL   Comment 1 Notify RN   Type and screen [place]     Status: None   Collection Time: 12/28/15  6:22 PM  Result Value Ref Range   ABO/RH(D) A POS    Antibody Screen NEG    Sample Expiration 12/31/2015   Glucose, capillary     Status: Abnormal   Collection Time: 12/28/15  6:24 PM  Result Value Ref Range   Glucose-Capillary 220 (H) 65 - 99 mg/dL   Comment 1 Notify RN    Comment 2 Document in Chart   Renal function panel (daily at 1600)     Status: Abnormal   Collection Time: 12/28/15  6:31 PM  Result Value Ref Range   Sodium 129 (L) 135 - 145 mmol/L   Potassium 3.5 3.5 - 5.1 mmol/L    Comment: DELTA CHECK NOTED NO VISIBLE HEMOLYSIS     Chloride 96 (L) 101 - 111 mmol/L   CO2 24 22 - 32 mmol/L   Glucose, Bld 224 (H) 65 - 99 mg/dL   BUN 39 (H) 6 - 20 mg/dL   Creatinine, Ser 5.56 (H) 0.61 - 1.24 mg/dL    Comment: REPEATED TO VERIFY DELTA CHECK NOTED    Calcium 8.4 (L) 8.9 - 10.3 mg/dL   Phosphorus 5.1 (H) 2.5 - 4.6 mg/dL   Albumin 2.9 (L) 3.5 - 5.0 g/dL   GFR calc non Af Amer 11 (L) >60 mL/min   GFR calc Af Amer 13 (L) >60 mL/min    Comment: (NOTE) The eGFR has been calculated using the CKD EPI equation. This calculation has not been validated in all clinical situations. eGFR's persistently <60 mL/min signify possible Chronic Kidney Disease.    Anion gap 9 5 - 15  Glucose, capillary     Status: Abnormal   Collection Time: 12/28/15  7:53 PM  Result Value Ref Range   Glucose-Capillary 261 (H) 65 - 99 mg/dL  Hemoglobin and hematocrit, blood     Status: Abnormal   Collection Time: 12/28/15  9:10 PM  Result Value Ref Range   Hemoglobin 9.5 (L) 13.0 - 17.0 g/dL    Comment: DELTA CHECK NOTED REPEATED TO VERIFY POST TRANSFUSION SPECIMEN    HCT 28.3 (L) 39.0 - 52.0 %  Glucose, capillary     Status: Abnormal   Collection Time: 12/29/15 12:17 AM  Result Value Ref Range   Glucose-Capillary 359 (H) 65 - 99 mg/dL  Glucose, capillary     Status: Abnormal   Collection Time: 12/29/15  4:25 AM  Result Value Ref Range   Glucose-Capillary 216 (H) 65 - 99 mg/dL  Magnesium     Status: None   Collection Time: 12/29/15  5:02 AM  Result Value Ref Range   Magnesium 2.1 1.7 - 2.4 mg/dL  CBC     Status: Abnormal   Collection Time: 12/29/15  5:02 AM  Result Value Ref Range   WBC 9.1 4.0 - 10.5 K/uL   RBC 3.00 (L) 4.22 - 5.81  MIL/uL   Hemoglobin 9.0 (L) 13.0 - 17.0 g/dL   HCT 27.1 (L) 39.0 - 52.0 %   MCV 90.3 78.0 - 100.0 fL   MCH 30.0 26.0 - 34.0 pg   MCHC 33.2 30.0 - 36.0 g/dL   RDW 17.4 (H) 11.5 - 15.5 %   Platelets 244 150 - 400 K/uL  Renal function panel     Status: Abnormal   Collection Time: 12/29/15  5:02 AM   Result Value Ref Range   Sodium 130 (L) 135 - 145 mmol/L   Potassium 4.3 3.5 - 5.1 mmol/L    Comment: DELTA CHECK NOTED   Chloride 96 (L) 101 - 111 mmol/L   CO2 23 22 - 32 mmol/L   Glucose, Bld 201 (H) 65 - 99 mg/dL   BUN 54 (H) 6 - 20 mg/dL   Creatinine, Ser 7.07 (H) 0.61 - 1.24 mg/dL   Calcium 8.5 (L) 8.9 - 10.3 mg/dL   Phosphorus 7.1 (H) 2.5 - 4.6 mg/dL   Albumin 2.9 (L) 3.5 - 5.0 g/dL   GFR calc non Af Amer 8 (L) >60 mL/min   GFR calc Af Amer 9 (L) >60 mL/min    Comment: (NOTE) The eGFR has been calculated using the CKD EPI equation. This calculation has not been validated in all clinical situations. eGFR's persistently <60 mL/min signify possible Chronic Kidney Disease.    Anion gap 11 5 - 15  Glucose, capillary     Status: Abnormal   Collection Time: 12/29/15  7:27 AM  Result Value Ref Range   Glucose-Capillary 246 (H) 65 - 99 mg/dL  Glucose, capillary     Status: Abnormal   Collection Time: 12/29/15  4:08 PM  Result Value Ref Range   Glucose-Capillary 541 (HH) 65 - 99 mg/dL   Comment 1 Notify RN     Ct Soft Tissue Neck Wo Contrast  Result Date: 12/27/2015 CLINICAL DATA:  50 year old male intubated a few days ago, sensation of throat closing up. Stridor. Initial encounter. EXAM: CT NECK WITHOUT CONTRAST TECHNIQUE: Multidetector CT imaging of the neck was performed following the standard protocol without intravenous contrast. COMPARISON:  Chest CTA 10/02/2014. FINDINGS: Large body habitus. Pharynx and larynx: The vocal cords are in close apposition (series 2, image 85) but this appears similar to the appearance on the 10/02/2014 chest CTA (series 5, image 5 of that exam). The supraglottic larynx and epiglottis are normal. The subglottic trachea does appear diminutive, but again similar to the prior chest CTA. Compare the Highly atelectatic appearance of the trachea on series 2, image 111 today to series 5, image 33 on the prior chest CTA. 7 Pharyngeal contours are within  normal limits. Negative parapharyngeal and retropharyngeal spaces. Salivary glands: Negative sublingual space, submandibular glands and parotid glands. Thyroid: Diminutive common normal. Lymph nodes: Negative.  No cervical lymphadenopathy. Vascular: Right IJ approach dual lumen catheter partially visualized. Calcified distal cervical ICA atherosclerosis. Calcified ICA siphons and distal vertebral arteries. Vasculature not otherwise evaluated in the absence of IV contrast. Limited intracranial: Negative. Visualized orbits: Postoperative changes to both globes. Mastoids and visualized paranasal sinuses: Small mucous retention cyst right sphenoid sinus. Other paranasal sinuses are clear. Partial opacification of both tympanic cavities and mastoid air cells. The ossicles appear to remain intact. The external auditory canals appear fairly normal. Skeleton:  No acute osseous abnormality identified. Upper chest: Stable visible lung apices. Partially visible chronic mediastinal lipomatosis. IMPRESSION: 1. Tracheomalacia. Highly atelectatic appearance of the trachea today, but not significantly changed  from a June Chest CTA (see series 2, image 111 today and series 5, image 33 on the 10/02/2014 chest CTA). 2. Likewise, the glottis appears stable since June. The supraglottic larynx is normal. No laryngeal trauma suspected. 3. Calcified atherosclerosis of the distal ICAs and vertebral arteries. Electronically Signed   By: Genevie Ann M.D.   On: 12/27/2015 18:46   Nm Myocar Multi W/spect W/wall Motion / Ef  Result Date: 12/29/2015  There was no ST segment deviation noted during stress.  No T wave inversion was noted during stress.  Defect 1: There is a medium defect of moderate severity present in the mid anterior, mid anterolateral, apical anterior and apical lateral location.  Findings consistent with apical anterolateral ischemia and prior apical anterolateral myocardial infarction.  This is an intermediate risk study.   The left ventricular ejection fraction is normal (55-65%).  Nuclear stress EF: 64%.     ROS:ROS  Blood pressure (!) 166/43, pulse 99, temperature 98 F (36.7 C), temperature source Oral, resp. rate 16, height _0  (1.727 m), weight 131 kg (288 lb 12.8 oz), SpO2 97 %.  PHYSICAL EXAM: General appearance - alert, well appearing, and in no distress, overweight and mild shortness of breath, minimal hoarseness Nose - normal and patent, no erythema, discharge or polyps Mouth - mucous membranes moist, pharynx normal without lesions and No airway obstruction or stridor Neck - supple, no significant adenopathy  Procedure Note: Flexible Laryngoscopy  Risks/benefits and possible complications were discussed in detail. Patient understands and agrees to proceed with procedure.   Procedure: 4 mm flexible laryngoscope inserted through the nasal passageway with minimal discomfort. Nasal cavity and nasopharynx patent without discharge, mass or polyp. Normal base of tongue and supraglottis,  Normal vocal cord mobility. No evidence of vocal cord paralysis, mass or lesion. Patient has heavy post glottic erythema consistent with reflux and some posterior primarily left cord edema. Normal hypopharynx without evidence of mass or aspiration. Patient's airway stable, no acute anatomic concern.  Patient tolerated procedure without complication or difficulty.  Dale [name]. Wilburn Cornelia, M.D.    Studies Reviewed: CT scan of the neck showed no evidence of mucosal mass or lesion, no obstruction. Concerns raised regarding possible tracheomalacia, patient has significant esophageal impingement of the posterior thoracic trachea with some narrowing. No acute change.  Assessment/Plan: Patient underwent recent intubation for acute airway distress and was activated 2 days later. He has symptoms of globus sensation, mild hoarseness and previous stridor. Flexible laryngoscopy shows no evidence of vocal cord or laryngeal  pathology with the exception of significant posterior glottic erythema consistent with reflux which may have been exacerbated by his recent intubation. Patient reassured regarding these findings, no intervention at this time. Recommend twice a day PPI therapy and strict reflux precautions in addition to his other medical therapy. Expect gradual improvement in symptoms with return to baseline over the next several weeks. The patient has seen an ENT surgeon in [facility] in the past and may consider follow-up with him. May also follow-up as an outpatient in our office in the future.  Collin, Minard Millirons 12/29/2015, 5:14 PM

## 2015-12-29 NOTE — Progress Notes (Signed)
Nutrition Follow-up  DOCUMENTATION CODES:   Morbid obesity  INTERVENTION:    Nepro Shake po once daily, each supplement provides 425 kcal and 19 grams protein  NUTRITION DIAGNOSIS:   Increased nutrient needs related to chronic illness as evidenced by estimated needs.  Ongoing  GOAL:   Patient will meet greater than or equal to 90% of their needs  Progressing  MONITOR:   PO intake, Supplement acceptance, Labs, I & O's  ASSESSMENT:   Patient was admitted on 8/28 with hyperkalemia, probable GI bleed, near arrest. Hx of ESRD on HD. Required intubation.  Patient was extubated on 8/30.  Diet advanced to Renal, CHO modified. Patient is consuming 75-100% of meals. Patient was NPO this AM for procedure. Labs reviewed phosphorus elevated, sodium low. Medications reviewed and include Novolog, Lantus, Prednisone. Plans for HD on Wednesday AM, still trying to remove volume.  Diet Order:  Diet renal/carb modified with fluid restriction Diet-HS Snack? Nothing; Room service appropriate? Yes; Fluid consistency: Thin  Skin:  Reviewed, no issues  Last BM:  9/4  Height:   Ht Readings from Last 1 Encounters:  12/21/15 5\' 8"  (1.727 m)    Weight:   Wt Readings from Last 1 Encounters:  12/29/15 288 lb 12.8 oz (131 kg)    Ideal Body Weight:  70 kg  BMI:  47 (adjusted for BKA)  Estimated Nutritional Needs:   Kcal:  2000-2200  Protein:  100-110 gm  Fluid:  1.5 L  EDUCATION NEEDS:   No education needs identified at this time  Molli Barrows, [place], [place], [place] Pager [phone] After Hours Pager [phone]

## 2015-12-29 NOTE — Progress Notes (Signed)
PROGRESS NOTE    Dale [name]  [id number] DOB: 1965-05-10 DOA: 12/21/2015 PCP: [name], MD    Brief Narrative: Dale [name] is a 50 y.o. male with PMH as outlined below including ESRD (M/W/F).  Per nephrology service, pt is extremely compliant with HD never missing any sessions.  On 8/27 he developed L arm pain which he initially thought was due to his "fistula not working".  Pain resolved spontaneously therefore he did feel that he needed to be evaluated.  The following morning 8/28, when he woke up to go to HD, pain was back and was worse.  He therefore went to ED for further evaluation.  In ED, he was found to have K 7, SCr 12.37, BUN 219, Hgb 6.9.  He was taken for urgent HD and after roughly 1 hour, he began to have diaphoresis, persistent L arm pain which radiated into chest, SOB, worsening anxiety.   PCCM was called and pt was emergently intubated while in HD unit.  He was transferred to ICU for further evaluation / management.   Assessment & Plan:   Principal Problem:   NSTEMI (non-ST elevated myocardial infarction) ([place]) Active Problems:   S/P BKA (below knee amputation) ([place])   Anemia   HTN (hypertension)   Coronary artery disease involving native coronary artery of native heart with unstable angina pectoris ([place])   HLD (hyperlipidemia)   Gastrointestinal hemorrhage with melena   Encounter for intubation   Acute blood loss anemia   Uremia   Acute encephalopathy   Troponin level elevated   Encounter for central line placement   Stridor   Iron deficiency anemia   Anemia - suspect hemorrhagic due to GI bleed PPI oral BID.  Endoscopy 9-01; normal esophagus, erythematous mucosa in the cardia, gastric fundus and gastric body. No source for bleeding identified.  Received one unit PRBC 9-01. Transfuse 2 units 9-04 Follow hb if continue to decrease will need to re-consult GI for further evaluation.   ESRD (M/W/F). Hyperkalemia - s/p emergent HD  8/28. Uremia - concern due to GI bleed. Hyponatremia. AGMA - uremia Improved with Dialysis.   Acute hypoxic respiratory failure. Stridor, tracheomalacia.  OSA with probable OHS. Improved. Patient had [facility] Friday 9-01/  Complaining of dyspnea, wheezing trach area.  Change IV solumedrol to oral prednisone. CT neck showed only tracheomalacia.  Appreciate Dr Melvyn Novas help/  Pain report difficulty breathing on and off. Will consult ENT>   Hypotension - suspect hemorrhagic due to GI bleed along with component hypovolemia - Received levophed.  BP improved.   N-STEMI; Positive troponin;- suspect demand ischemia due to GI bleed.  Will consider NSTEMI given chest pain however vs demand ischemia ECHO; EF 65 %  Troponin: -7.9----18----5.4 Cardiology consulted for further evaluation.  Stress test pending.   Hx HTN, HLD, CAD, dCHF (Echo Nov 2016 with EF 60-65%, G2DD).  DM; SSI.  Increase lantus to 25 units 9-04.  Taper steroids.   Acute encephalopathy improved Hx fibromyalgia. Chronic pain.  On gabapentin Started on ropinirole. Alert oriented.  On oral dilaudid at home.  IV dilaudid PRN.     DVT prophylaxis: SCD.  Code Status: Full code.  Family Communication: Wife 9-02 Disposition Plan: Pulmonary evaluation, Blood transfusion.     Consultants:   CCM admitted patient.   GI  Nephrology    Procedures:  Endoscopy 9-01  Antimicrobials:   none   Subjective: Had black stool today.  Report feeling ok, only get difficulty breathing on and off ,  at times feels throat get tight.    Objective: Vitals:   12/29/15 1018 12/29/15 1059 12/29/15 1101 12/29/15 1102  BP: 137/82 (!) 160/66 (!) 158/43 (!) 166/43  Pulse: 97 98 98 96  Resp:      Temp:      TempSrc:      SpO2:      Weight:      Height:        Intake/Output Summary (Last 24 hours) at 12/29/15 1400 Last data filed at 12/29/15 1222  Gross per 24 hour  Intake             1150 ml  Output             2628 ml  Net             -1478 ml   Filed Weights   12/28/15 1310 12/28/15 1729 12/29/15 0400  Weight: 130.2 kg (287 lb 0.6 oz) 128 kg (282 lb 3 oz) 131 kg (288 lb 12.8 oz)    Examination:  General exam: Appears calm and comfortable  Respiratory system: Clear to auscultation. Respiratory effort normal. Cardiovascular system: S1 & S2 heard, RRR. No JVD, murmurs, rubs, gallops or clicks. No pedal edema. Gastrointestinal system: Abdomen is nondistended, soft and nontender. No organomegaly or masses felt. Normal bowel sounds heard. Central nervous system: Alert and oriented. No focal neurological deficits. Extremities: Symmetric 5 x 5 power. Skin: No rashes, lesions or ulcers Psychiatry: Judgement and insight appear normal. Mood & affect appropriate.     Data Reviewed: I have personally reviewed following labs and imaging studies  CBC:  Recent Labs Lab 12/25/15 0555 12/26/15 0306 12/27/15 0350 12/28/15 0259 12/28/15 0809 12/28/15 2110 12/29/15 0502  WBC 6.2 7.9 7.4 5.8 5.9  --  9.1  NEUTROABS 4.6  --   --   --   --   --   --   HGB 6.7* 7.4* 7.3* 7.4* 7.1* 9.5* 9.0*  HCT 21.4* 22.8* 22.5* 21.8* 20.8* 28.3* 27.1*  MCV 99.1 95.0 94.5 92.8 92.0  --  90.3  PLT 160 178 217 234 240  --  [id number]   Basic Metabolic Panel:  Recent Labs Lab 12/25/15 0555  12/26/15 0306  12/27/15 0350 12/27/15 1605 12/28/15 0259 12/28/15 0809 12/28/15 1831 12/29/15 0502  NA 136  < > 134*  < > 133* 129* 127* 127* 129* 130*  K 4.6  < > 3.4*  < > 3.7 4.7 4.8 4.8 3.5 4.3  CL 97*  < > 97*  < > 95* 93* 90* 90* 96* 96*  CO2 24  < > 27  < > 26 23 21* 22 24 23   GLUCOSE 275*  < > 165*  < > 156* 301* 396* 447* 224* 201*  BUN 76*  < > 38*  < > 60* 68* 82* 88* 39* 54*  CREATININE 7.87*  < > 4.95*  < > 7.39* 8.49* 9.77* 10.23* 5.56* 7.07*  CALCIUM 8.4*  < > 8.3*  < > 8.5* 8.5* 8.3* 8.4* 8.4* 8.5*  MG 2.5*  --  2.1  --  2.2  --  2.2  --   --  2.1  PHOS 9.4*  < > 5.4*  < > 7.9* 8.7* 9.6* 10.0* 5.1* 7.1*  < > = values in  this interval not displayed. GFR: Estimated Creatinine Clearance: 16.5 mL/min (by C-G formula based on SCr of 7.07 mg/dL). Liver Function Tests:  Recent Labs Lab 12/23/15 0615  12/27/15 1605 12/28/15 0259  12/28/15 0809 12/28/15 1831 12/29/15 0502  AST 39  --   --   --   --   --   --   ALT 25  --   --   --   --   --   --   ALKPHOS 45  --   --   --   --   --   --   BILITOT 0.3  --   --   --   --   --   --   PROT 5.1*  --   --   --   --   --   --   ALBUMIN 2.4*  < > 2.8* 2.7* 2.7* 2.9* 2.9*  < > = values in this interval not displayed. No results for input(s): LIPASE, AMYLASE in the last 168 hours. No results for input(s): AMMONIA in the last 168 hours. Coagulation Profile: No results for input(s): INR, PROTIME in the last 168 hours. Cardiac Enzymes:  Recent Labs Lab 12/23/15 0615  TROPONINI 5.43*   BNP (last 3 results) No results for input(s): PROBNP in the last 8760 hours. HbA1C: No results for input(s): HGBA1C in the last 72 hours. CBG:  Recent Labs Lab 12/28/15 1824 12/28/15 1953 12/29/15 0017 12/29/15 0425 12/29/15 0727  GLUCAP 220* 261* 359* 216* 246*   Lipid Profile: No results for input(s): CHOL, HDL, LDLCALC, TRIG, CHOLHDL, LDLDIRECT in the last 72 hours. Thyroid Function Tests: No results for input(s): TSH, T4TOTAL, FREET4, T3FREE, THYROIDAB in the last 72 hours. Anemia Panel: No results for input(s): VITAMINB12, FOLATE, FERRITIN, TIBC, IRON, RETICCTPCT in the last 72 hours. Sepsis Labs: No results for input(s): PROCALCITON, LATICACIDVEN in the last 168 hours.  Recent Results (from the past 240 hour(s))  MRSA PCR Screening     Status: None   Collection Time: 12/21/15 11:39 AM  Result Value Ref Range Status   MRSA by PCR NEGATIVE NEGATIVE Final    Comment:        The GeneXpert MRSA Assay (FDA approved for NASAL specimens only), is one component of a comprehensive MRSA colonization surveillance program. It is not intended to diagnose  MRSA infection nor to guide or monitor treatment for MRSA infections.          Radiology Studies: Ct Soft Tissue Neck Wo Contrast  Result Date: 12/27/2015 CLINICAL DATA:  50 year old male intubated a few days ago, sensation of throat closing up. Stridor. Initial encounter. EXAM: CT NECK WITHOUT CONTRAST TECHNIQUE: Multidetector CT imaging of the neck was performed following the standard protocol without intravenous contrast. COMPARISON:  Chest CTA 10/02/2014. FINDINGS: Large body habitus. Pharynx and larynx: The vocal cords are in close apposition (series 2, image 85) but this appears similar to the appearance on the 10/02/2014 chest CTA (series 5, image 5 of that exam). The supraglottic larynx and epiglottis are normal. The subglottic trachea does appear diminutive, but again similar to the prior chest CTA. Compare the Highly atelectatic appearance of the trachea on series 2, image 111 today to series 5, image 33 on the prior chest CTA. 7 Pharyngeal contours are within normal limits. Negative parapharyngeal and retropharyngeal spaces. Salivary glands: Negative sublingual space, submandibular glands and parotid glands. Thyroid: Diminutive common normal. Lymph nodes: Negative.  No cervical lymphadenopathy. Vascular: Right IJ approach dual lumen catheter partially visualized. Calcified distal cervical ICA atherosclerosis. Calcified ICA siphons and distal vertebral arteries. Vasculature not otherwise evaluated in the absence of IV contrast. Limited intracranial: Negative. Visualized orbits: Postoperative changes to both  globes. Mastoids and visualized paranasal sinuses: Small mucous retention cyst right sphenoid sinus. Other paranasal sinuses are clear. Partial opacification of both tympanic cavities and mastoid air cells. The ossicles appear to remain intact. The external auditory canals appear fairly normal. Skeleton:  No acute osseous abnormality identified. Upper chest: Stable visible lung apices.  Partially visible chronic mediastinal lipomatosis. IMPRESSION: 1. Tracheomalacia. Highly atelectatic appearance of the trachea today, but not significantly changed from a June Chest CTA (see series 2, image 111 today and series 5, image 33 on the 10/02/2014 chest CTA). 2. Likewise, the glottis appears stable since June. The supraglottic larynx is normal. No laryngeal trauma suspected. 3. Calcified atherosclerosis of the distal ICAs and vertebral arteries. Electronically Signed   By: Genevie Ann M.D.   On: 12/27/2015 18:46        Scheduled Meds: . sodium chloride   Intravenous Once  . sodium chloride   Intravenous Once  . sodium chloride   Intravenous Once  . aspirin EC  81 mg Oral QHS  . atorvastatin  80 mg Oral QHS  . fluticasone  1 spray Each Nare Daily  . gabapentin  200 mg Oral TID  . insulin aspart  0-15 Units Subcutaneous Q4H  . insulin glargine  25 Units Subcutaneous QHS  . ipratropium-albuterol  3 mL Nebulization TID  . levothyroxine  100 mcg Oral QAC breakfast  . loratadine  5 mg Oral BID  . mouth rinse  15 mL Mouth Rinse BID  . pantoprazole  40 mg Oral BID AC  . predniSONE  20 mg Oral BID WC  . regadenoson      . rOPINIRole  2 mg Oral Q supper  . sodium chloride flush  10-40 mL Intracatheter Q12H   Continuous Infusions:     LOS: 8 days    Time spent: 35 minutes.     Elmarie Shiley, MD [facility] Pager [phone]  If 7PM-7AM, please contact night-coverage [email or web address] Password [email or web address] 12/29/2015, 2:00 PM

## 2015-12-29 NOTE — Progress Notes (Signed)
Dr. [name] will review nuc results in detail with pt tomorrow for further plan.  I have notified pt

## 2015-12-29 NOTE — Progress Notes (Signed)
Lexiscan stress portion completed without complications.  Nuc results to follow.  Resting portion done Sat.

## 2015-12-29 NOTE — Progress Notes (Signed)
[facility] Progress Note   Subjective: no c/o today  Vitals:   12/29/15 1018 12/29/15 1059 12/29/15 1101 12/29/15 1102  BP: 137/82 (!) 160/66 (!) 158/43 (!) 166/43  Pulse: 97 98 98 96  Resp:      Temp:      TempSrc:      SpO2:      Weight:      Height:        Inpatient medications: . sodium chloride   Intravenous Once  . sodium chloride   Intravenous Once  . sodium chloride   Intravenous Once  . aspirin EC  81 mg Oral QHS  . atorvastatin  80 mg Oral QHS  . fluticasone  1 spray Each Nare Daily  . gabapentin  200 mg Oral TID  . insulin aspart  0-15 Units Subcutaneous Q4H  . insulin glargine  25 Units Subcutaneous QHS  . ipratropium-albuterol  3 mL Nebulization TID  . levothyroxine  100 mcg Oral QAC breakfast  . loratadine  5 mg Oral BID  . mouth rinse  15 mL Mouth Rinse BID  . pantoprazole  40 mg Oral BID AC  . predniSONE  20 mg Oral BID WC  . regadenoson      . rOPINIRole  2 mg Oral Q supper  . sodium chloride flush  10-40 mL Intracatheter Q12H     sodium chloride, sodium chloride, sodium chloride, alteplase, diphenhydrAMINE, heparin, HYDROmorphone (DILAUDID) injection, HYDROmorphone, lidocaine (PF), lidocaine-prilocaine, ondansetron (ZOFRAN) IV, pentafluoroprop-tetrafluoroeth, phenol, promethazine **OR** promethazine, sodium chloride flush  Exam: No distress, calm Obese Chest clear bilat RRR no RG Abd marked obese, ntnd , no ascites LUA AVF +bruit/ IJ HD cath NF, Ox3 L BKA/ no LE edema  Dialysis: MWF AF    4h 23min  2/2.25 bath   127kg   Hep 7000  P4     IJ cath/ LUA AVF (have started using AVF at OP center approx 2 wks now, just got down to 15 ga w 2 needles)  No ESA, weekly Fe at center     Assessment 1. Acute GI Bleed- friable gastric mucosa on EGD w remnants from bleeding. S/P prbc's (6-8 units total so far). PPI.   2. CP/ NSTEMI: peak trop 18. For nuclear stress today 3. ESRD- MWF-SW, has cath and using AVF at center, 15ga w 2  needles 4. ChronicHypotension-on midodrine pre HD 5. Anemia - not on ESA at center, start ESA now darbe 80 mg/ wk; tsat 34% 6. S/p VDRF 7. DM2 - on insulin 8. Resp failure/ VDRF - resolved 9. Volume - up 4kg by wts  Plan - HD Wed am. Consider remove HD cath while here if using AVF well tomorrow. Get vol down , UF 4 kg w HD tomorrow. Start ESA.    Dale Splinter MD [facility] pager [phone]    cell [phone] 12/29/2015, 1:47 PM    Recent Labs Lab 12/28/15 0809 12/28/15 1831 12/29/15 0502  NA 127* 129* 130*  K 4.8 3.5 4.3  CL 90* 96* 96*  CO2 22 24 23   GLUCOSE 447* 224* 201*  BUN 88* 39* 54*  CREATININE 10.23* 5.56* 7.07*  CALCIUM 8.4* 8.4* 8.5*  PHOS 10.0* 5.1* 7.1*    Recent Labs Lab 12/23/15 0615  12/28/15 0809 12/28/15 1831 12/29/15 0502  AST 39  --   --   --   --   ALT 25  --   --   --   --   Dale [name]  45  --   --   --   --   BILITOT 0.3  --   --   --   --   PROT 5.1*  --   --   --   --   ALBUMIN 2.4*  < > 2.7* 2.9* 2.9*  < > = values in this interval not displayed.  Recent Labs Lab 12/25/15 0555  12/28/15 0259 12/28/15 0809 12/28/15 2110 12/29/15 0502  WBC 6.2  < > 5.8 5.9  --  9.1  NEUTROABS 4.6  --   --   --   --   --   HGB 6.7*  < > 7.4* 7.1* 9.5* 9.0*  HCT 21.4*  < > 21.8* 20.8* 28.3* 27.1*  MCV 99.1  < > 92.8 92.0  --  90.3  PLT 160  < > 234 240  --  244  < > = values in this interval not displayed. Iron/TIBC/Ferritin/ %Sat    Component Value Date/Time   IRON 57 05/11/2012 1551   TIBC 168 (L) 05/11/2012 1551   FERRITIN 350 (H) 05/11/2012 1551   IRONPCTSAT 34 05/11/2012 1551

## 2015-12-29 NOTE — Progress Notes (Signed)
Physical Therapy Treatment Patient Details Name: Dale [name] MRN: [id number] DOB: [birth year] Today's Date: 12/29/2015    History of Present Illness Patient is a 50 yo male admitted 12/21/15 with anemia, poss GIB, Hyperkalemia.  Patient less responsive and intubated 12/21/15.  Patient with anemia, acute encephalopathy, VDRF.  Extubated 12/23/15.      PMH:  ESRD on HD, morbid obesity, Lt BKA    PT Comments    Patient seen for mobility progression. Tolerated increased distance but remains limited by fatigue today (pt s/p stress test earlier today). Will continue to see and progress as tolerated.  Follow Up Recommendations  Home health PT;Supervision for mobility/OOB     Equipment Recommendations  None recommended by PT    Recommendations for Other Services       Precautions / Restrictions Precautions Precautions: Fall Other Brace/Splint: Lt BKA prosthesis Restrictions Weight Bearing Restrictions: No    Mobility  Bed Mobility               General bed mobility comments: Sitting EOB upon arrival  Transfers Overall transfer level: Needs assistance Equipment used: Rolling walker (2 wheeled) Transfers: Sit to/from Stand Sit to Stand: Min guard         General transfer comment: Vcs for hand placement increased time and effort to perform  Ambulation/Gait Ambulation/Gait assistance: Min guard Ambulation Distance (Feet): 100 Feet Assistive device: Rolling walker (2 wheeled) Gait Pattern/deviations: Step-through pattern;Decreased stride length;Trunk flexed   Gait velocity interpretation: Below normal speed for age/gender General Gait Details: VCs for upright posture, cues for standing rest break and pursed lip breathing (ambulated on 2 liters supplemental O2)   Stairs            Wheelchair Mobility    Modified Rankin (Stroke Patients Only)       Balance     Sitting balance-Leahy Scale: Good     Standing balance support: Bilateral upper extremity  supported Standing balance-Leahy Scale: Poor                      Cognition Arousal/Alertness: Awake/alert Behavior During Therapy: WFL for tasks assessed/performed Overall Cognitive Status: Within Functional Limits for tasks assessed                      Exercises      General Comments General comments (skin integrity, edema, etc.): sat EOB and performed Pursed lip breathing with cues ~2 minutes on 2 liters      Pertinent Vitals/Pain Pain Assessment: No/denies pain    Home Living                      Prior Function            PT Goals (current goals can now be found in the care plan section) Acute Rehab PT Goals Patient Stated Goal: to get stronger PT Goal Formulation: With patient Time For Goal Achievement: 01/02/16 Potential to Achieve Goals: Good Progress towards PT goals: Progressing toward goals    Frequency  Min 3X/week    PT Plan Current plan remains appropriate    Co-evaluation             End of Session Equipment Utilized During Treatment: Gait belt;Oxygen Activity Tolerance: Patient tolerated treatment well;Patient limited by fatigue (hard stress test today) Patient left: in bed;with call bell/phone within reach (sitting EOB)     Time: [id number] PT Time Calculation (min) (ACUTE ONLY): 17 min  Charges:  $Gait Training: 8-22 mins                    G CodesDuncan [name] 26-Jan-2016, 2:55 PM Dale [name], [place] DPT  [phone]

## 2015-12-29 NOTE — Progress Notes (Signed)
Admitted 12/21/15 22m with complex PMH including CAD s/p prior PCIs, chronic diastolic CH, anemia of chronic disease, ESRD, hyperkalemia, GERd, fibromylagia, HLD, DM, OSA, pericardial effusion who presented to [facility] with left arm pain, potassium of 7, hemoglobin of 6.9. Rec'd multiple units of blood with persistent anemia. Had to be intubated in HD unit. Trop peak 18.  Subjective: No chest pain, stable breathing  Objective: Vital signs in last 24 hours: Temp:  [97.4 F (36.3 C)-98.1 F (36.7 C)] 98 F (36.7 C) (09/05 0737) Pulse Rate:  [88-98] 97 (09/05 1018) Resp:  [10-23] 22 (09/05 0737) BP: (87-159)/(38-93) 137/82 (09/05 1018) SpO2:  [98 %-100 %] 100 % (09/05 0737) Weight:  [282 lb 3 oz (128 kg)-288 lb 12.8 oz (131 kg)] 288 lb 12.8 oz (131 kg) (09/05 0400) Weight change: -7 lb 1 oz (-3.203 kg) Last BM Date: 12/28/15 Intake/Output from previous day: 09/04 0701 - 09/05 0700 In: 1390 [P.O.:720; Blood:670] Out: 2628  Intake/Output this shift: No intake/output data recorded.  PE: General:Pleasant affect, NAD Skin:Warm and dry, brisk capillary refill HEENT:normocephalic, sclera clear, mucus membranes moist Neck:supple, no JVD Heart:S1S2 RRR without murmur, gallup, rub or click Lungs:clear, ant. without rales, rhonchi, or wheezes, though upper respiratory wheezes noted none in lung fields. [id number], non tender, + BS, do not palpate liver spleen or masses Ext:no lower ext edema,Lt BKA, 2+ radial pulses Neuro:alert and oriented X 3, MAE, follows commands, + facial symmetry   Lab Results:  Recent Labs  12/28/15 0809 12/28/15 2110 12/29/15 0502  WBC 5.9  --  9.1  HGB 7.1* 9.5* 9.0*  HCT 20.8* 28.3* 27.1*  PLT 240  --  244   BMET  Recent Labs  12/28/15 1831 12/29/15 0502  NA 129* 130*  K 3.5 4.3  CL 96* 96*  CO2 24 23  GLUCOSE 224* 201*  BUN 39* 54*  CREATININE 5.56* 7.07*  CALCIUM 8.4* 8.5*   No results for input(s): TROPONINI in the last 72  hours.  Invalid input(s): CK, MB  Lab Results  Component Value Date   CHOL 143 02/25/2015   HDL 29 (L) 02/25/2015   LDLCALC 63 02/25/2015   TRIG 255 (H) 02/25/2015   CHOLHDL 4.9 02/25/2015   Lab Results  Component Value Date   HGBA1C 6.6 (H) 02/25/2015     Lab Results  Component Value Date   TSH 2.489 12/24/2015    Hepatic Function Panel  Recent Labs  12/29/15 0502  ALBUMIN 2.9*   No results for input(s): CHOL in the last 72 hours. No results for input(s): PROTIME in the last 72 hours.     Studies/Results: Ct Soft Tissue Neck Wo Contrast  Result Date: 12/27/2015 CLINICAL DATA:  50 year old male intubated a few days ago, sensation of throat closing up. Stridor. Initial encounter. EXAM: CT NECK WITHOUT CONTRAST TECHNIQUE: Multidetector CT imaging of the neck was performed following the standard protocol without intravenous contrast. COMPARISON:  Chest CTA 10/02/2014. FINDINGS: Large body habitus. Pharynx and larynx: The vocal cords are in close apposition (series 2, image 85) but this appears similar to the appearance on the 10/02/2014 chest CTA (series 5, image 5 of that exam). The supraglottic larynx and epiglottis are normal. The subglottic trachea does appear diminutive, but again similar to the prior chest CTA. Compare the Highly atelectatic appearance of the trachea on series 2, image 111 today to series 5, image 33 on the prior chest CTA. 7 Pharyngeal contours are within normal limits. Negative parapharyngeal  and retropharyngeal spaces. Salivary glands: Negative sublingual space, submandibular glands and parotid glands. Thyroid: Diminutive common normal. Lymph nodes: Negative.  No cervical lymphadenopathy. Vascular: Right IJ approach dual lumen catheter partially visualized. Calcified distal cervical ICA atherosclerosis. Calcified ICA siphons and distal vertebral arteries. Vasculature not otherwise evaluated in the absence of IV contrast. Limited intracranial: Negative.  Visualized orbits: Postoperative changes to both globes. Mastoids and visualized paranasal sinuses: Small mucous retention cyst right sphenoid sinus. Other paranasal sinuses are clear. Partial opacification of both tympanic cavities and mastoid air cells. The ossicles appear to remain intact. The external auditory canals appear fairly normal. Skeleton:  No acute osseous abnormality identified. Upper chest: Stable visible lung apices. Partially visible chronic mediastinal lipomatosis. IMPRESSION: 1. Tracheomalacia. Highly atelectatic appearance of the trachea today, but not significantly changed from a June Chest CTA (see series 2, image 111 today and series 5, image 33 on the 10/02/2014 chest CTA). 2. Likewise, the glottis appears stable since June. The supraglottic larynx is normal. No laryngeal trauma suspected. 3. Calcified atherosclerosis of the distal ICAs and vertebral arteries. Electronically Signed   By: Genevie Ann M.D.   On: 12/27/2015 18:46    Medications: I have reviewed the patient's current medications. Scheduled Meds: . regadenoson      . sodium chloride   Intravenous Once  . sodium chloride   Intravenous Once  . sodium chloride   Intravenous Once  . aspirin EC  81 mg Oral QHS  . atorvastatin  80 mg Oral QHS  . fluticasone  1 spray Each Nare Daily  . gabapentin  200 mg Oral TID  . insulin aspart  0-15 Units Subcutaneous Q4H  . insulin glargine  25 Units Subcutaneous QHS  . ipratropium-albuterol  3 mL Nebulization TID  . levothyroxine  100 mcg Oral QAC breakfast  . loratadine  5 mg Oral BID  . mouth rinse  15 mL Mouth Rinse BID  . pantoprazole  40 mg Oral BID AC  . predniSONE  20 mg Oral BID WC  . rOPINIRole  2 mg Oral Q supper  . sodium chloride flush  10-40 mL Intracatheter Q12H   Continuous Infusions:  PRN Meds:.sodium chloride, sodium chloride, sodium chloride, alteplase, diphenhydrAMINE, heparin, HYDROmorphone (DILAUDID) injection, HYDROmorphone, lidocaine (PF),  lidocaine-prilocaine, ondansetron (ZOFRAN) IV, pentafluoroprop-tetrafluoroeth, phenol, promethazine **OR** promethazine, sodium chloride flush  Assessment/Plan: Principal Problem:   NSTEMI (non-ST elevated myocardial infarction) ([place]) Active Problems:   S/P BKA (below knee amputation) (H[facility])   Anemia   HTN (hypertension)   Coronary artery disease involving native coronary artery of native heart with unstable angina pectoris (HCC)   HLD (hyperlipidemia)   Gastrointestinal hemorrhage with melena   Encounter for intubation   Acute blood loss anemia   Uremia   Acute encephalopathy   Troponin level elevated   Encounter for central line placement   Stridor   Iron deficiency anemia  1. NSTEMI  lexiscan stress portion to be done today -normal LV function on Echo -troponin pk of 18  2. Post intubation for obtundation and hypotension. He has been extubated for greater than 48 hours without recurrent significant respiratory distress. 3. Type 1 diabetes mellitus with end-stage kidney disease and chronic dialysis. Was dialyzed yesterday  4. Severe anemia with hemoglobin 7.1 on Sunday and transfused in HD. More recent hemoglobin determinations prior to admission have been in the 10 range.   LOS: 8 days   Time spent with pt. :10 minutes. Cecilie Kicks  Nurse Practitioner Certified Pager [phone] or  after 5pm and on weekends call [phone] 12/29/2015, 10:48 AM  I have personally seen and examined this patient with Cecilie Kicks, NP. I agree with the assessment and plan as outlined above. He was admitted 12/21/15 with anemia, hyperkalemia, respiratory failure. He also had left arm pain, no chest pain. Troponin elevated with peak of 18. Unclear if he had chest pain. Troponin has now trended down. My exam shows a morbidly obese male, NAD, CV:RRR, Lungs overall clear. No right LE edema. Left leg amputation with prosthetic limb in place. Labs reviewed.  He is known to have CAD with complex PCI of the  heavily calcified RCA in December 2015. He has moderate disease in the distal rCA and Diagonal. He has had no chest pain at home. His troponin elevation could certainly represent demand ischemia in setting of profound anemia and metabolic derangements. Stress test today is pending.   Lauree Chandler 12/29/2015 2:27 PM

## 2015-12-29 NOTE — Care Management Important Message (Signed)
Important Message  Patient Details  Name: Dale [name] MRN: [id number] Date of Birth: July 16, 1965   Medicare Important Message Given:  Yes    Nathen May 12/29/2015, 5:05 PM

## 2015-12-29 NOTE — Progress Notes (Signed)
Pt blood sugar 540. Paged MD, advised will put in order for insulin drip

## 2015-12-30 LAB — GLUCOSE, CAPILLARY
Glucose-Capillary: 116 mg/dL — ABNORMAL HIGH (ref 65–99)
Glucose-Capillary: 122 mg/dL — ABNORMAL HIGH (ref 65–99)
Glucose-Capillary: 129 mg/dL — ABNORMAL HIGH (ref 65–99)
Glucose-Capillary: 143 mg/dL — ABNORMAL HIGH (ref 65–99)
Glucose-Capillary: 164 mg/dL — ABNORMAL HIGH (ref 65–99)
Glucose-Capillary: 175 mg/dL — ABNORMAL HIGH (ref 65–99)
Glucose-Capillary: 195 mg/dL — ABNORMAL HIGH (ref 65–99)
Glucose-Capillary: 204 mg/dL — ABNORMAL HIGH (ref 65–99)
Glucose-Capillary: 210 mg/dL — ABNORMAL HIGH (ref 65–99)
Glucose-Capillary: 223 mg/dL — ABNORMAL HIGH (ref 65–99)
Glucose-Capillary: 224 mg/dL — ABNORMAL HIGH (ref 65–99)
Glucose-Capillary: 236 mg/dL — ABNORMAL HIGH (ref 65–99)
Glucose-Capillary: 342 mg/dL — ABNORMAL HIGH (ref 65–99)
Glucose-Capillary: 365 mg/dL — ABNORMAL HIGH (ref 65–99)

## 2015-12-30 LAB — CBC
HCT: 27.4 % — ABNORMAL LOW (ref 39.0–52.0)
Hemoglobin: 9.2 g/dL — ABNORMAL LOW (ref 13.0–17.0)
MCH: 30.3 pg (ref 26.0–34.0)
MCHC: 33.6 g/dL (ref 30.0–36.0)
MCV: 90.1 fL (ref 78.0–100.0)
Platelets: 267 10*3/uL (ref 150–400)
RBC: 3.04 MIL/uL — ABNORMAL LOW (ref 4.22–5.81)
RDW: 17 % — ABNORMAL HIGH (ref 11.5–15.5)
WBC: 9.7 10*3/uL (ref 4.0–10.5)

## 2015-12-30 LAB — RENAL FUNCTION PANEL
Albumin: 3 g/dL — ABNORMAL LOW (ref 3.5–5.0)
Albumin: 3.1 g/dL — ABNORMAL LOW (ref 3.5–5.0)
Anion gap: 16 — ABNORMAL HIGH (ref 5–15)
Anion gap: 12 (ref 5–15)
BUN: 79 mg/dL — ABNORMAL HIGH (ref 6–20)
BUN: 31 mg/dL — ABNORMAL HIGH (ref 6–20)
CO2: 19 mmol/L — ABNORMAL LOW (ref 22–32)
CO2: 25 mmol/L (ref 22–32)
Calcium: 8.6 mg/dL — ABNORMAL LOW (ref 8.9–10.3)
Calcium: 8 mg/dL — ABNORMAL LOW (ref 8.9–10.3)
Chloride: 93 mmol/L — ABNORMAL LOW (ref 101–111)
Chloride: 93 mmol/L — ABNORMAL LOW (ref 101–111)
Creatinine, Ser: 8.99 mg/dL — ABNORMAL HIGH (ref 0.61–1.24)
Creatinine, Ser: 4.71 mg/dL — ABNORMAL HIGH (ref 0.61–1.24)
GFR calc Af Amer: 7 mL/min — ABNORMAL LOW (ref >60)
GFR calc Af Amer: 15 mL/min — ABNORMAL LOW (ref >60)
GFR calc non Af Amer: 6 mL/min — ABNORMAL LOW (ref >60)
GFR calc non Af Amer: 13 mL/min — ABNORMAL LOW (ref >60)
Glucose, Bld: 98 mg/dL (ref 65–99)
Glucose, Bld: 197 mg/dL — ABNORMAL HIGH (ref 65–99)
Phosphorus: 9 mg/dL — ABNORMAL HIGH (ref 2.5–4.6)
Phosphorus: 4.9 mg/dL — ABNORMAL HIGH (ref 2.5–4.6)
Potassium: 4.4 mmol/L (ref 3.5–5.1)
Potassium: 4 mmol/L (ref 3.5–5.1)
Sodium: 128 mmol/L — ABNORMAL LOW (ref 135–145)
Sodium: 130 mmol/L — ABNORMAL LOW (ref 135–145)

## 2015-12-30 LAB — TRANSFUSION REACTION
DAT C3: NEGATIVE
Post RXN DAT IgG: NEGATIVE

## 2015-12-30 LAB — BASIC METABOLIC PANEL
Anion gap: 20 — ABNORMAL HIGH (ref 5–15)
BUN: 79 mg/dL — ABNORMAL HIGH (ref 6–20)
CO2: 18 mmol/L — ABNORMAL LOW (ref 22–32)
Calcium: 8 mg/dL — ABNORMAL LOW (ref 8.9–10.3)
Chloride: 88 mmol/L — ABNORMAL LOW (ref 101–111)
Creatinine, Ser: 9.11 mg/dL — ABNORMAL HIGH (ref 0.61–1.24)
GFR calc Af Amer: 7 mL/min — ABNORMAL LOW (ref >60)
GFR calc non Af Amer: 6 mL/min — ABNORMAL LOW (ref >60)
Glucose, Bld: 356 mg/dL — ABNORMAL HIGH (ref 65–99)
Potassium: 4.4 mmol/L (ref 3.5–5.1)
Sodium: 126 mmol/L — ABNORMAL LOW (ref 135–145)

## 2015-12-30 LAB — MAGNESIUM: Magnesium: 2.2 mg/dL (ref 1.7–2.4)

## 2015-12-30 MED ORDER — SODIUM CHLORIDE 0.9% FLUSH: 3.0000 mL | INTRAVENOUS | Status: DC | PRN

## 2015-12-30 MED ORDER — SODIUM CHLORIDE 0.9 % IV SOLN: 100.0000 mL | INTRAVENOUS | Status: DC | PRN

## 2015-12-30 MED ORDER — HEPARIN SODIUM (PORCINE) 1000 UNIT/ML DIALYSIS: 1000.0000 [IU] | INTRAMUSCULAR | Status: DC | PRN

## 2015-12-30 MED ORDER — SODIUM CHLORIDE 0.9 % IV SOLN: 250.0000 mL | INTRAVENOUS | Status: DC | PRN

## 2015-12-30 MED ORDER — INSULIN ASPART 100 UNIT/ML ~~LOC~~ SOLN
3.0000 [IU] | Freq: Three times a day (TID) | SUBCUTANEOUS | Status: DC
  Administered 2015-12-31 (×2): 3 [IU] via SUBCUTANEOUS

## 2015-12-30 MED ORDER — HYDRALAZINE HCL 20 MG/ML IJ SOLN
10.0000 mg | Freq: Four times a day (QID) | INTRAMUSCULAR | Status: DC | PRN
  Administered 2016-01-01: 10 mg via INTRAVENOUS
  Filled 2015-12-30: qty 1

## 2015-12-30 MED ORDER — INSULIN GLARGINE 100 UNIT/ML ~~LOC~~ SOLN
20.0000 [IU] | Freq: Two times a day (BID) | SUBCUTANEOUS | Status: DC
  Administered 2015-12-30 – 2015-12-31 (×2): 20 [IU] via SUBCUTANEOUS
  Filled 2015-12-30 (×3): qty 0.2

## 2015-12-30 MED ORDER — PREDNISONE 20 MG PO TABS
20.0000 mg | ORAL_TABLET | Freq: Every day | ORAL | Status: DC
  Administered 2015-12-30: 20 mg via ORAL
  Filled 2015-12-30: qty 1

## 2015-12-30 MED ORDER — ALBUMIN HUMAN 25 % IV SOLN: 25.0000 g | Freq: Once | INTRAVENOUS | Status: DC

## 2015-12-30 MED ORDER — ALUM & MAG HYDROXIDE-SIMETH 200-200-20 MG/5ML PO SUSP
30.0000 mL | ORAL | Status: DC | PRN
Start: 2015-12-30 — End: 2016-01-04
  Administered 2015-12-30 – 2016-01-03 (×3): 30 mL via ORAL
  Filled 2015-12-30 (×3): qty 30

## 2015-12-30 MED ORDER — INSULIN ASPART 100 UNIT/ML ~~LOC~~ SOLN
0.0000 [IU] | Freq: Three times a day (TID) | SUBCUTANEOUS | Status: DC
  Administered 2015-12-30: 4 [IU] via SUBCUTANEOUS
  Administered 2015-12-30: 7 [IU] via SUBCUTANEOUS
  Administered 2015-12-31: 11 [IU] via SUBCUTANEOUS
  Administered 2015-12-31: 7 [IU] via SUBCUTANEOUS
  Administered 2016-01-01: 17:00:00 11 [IU] via SUBCUTANEOUS
  Administered 2016-01-02: 4 [IU] via SUBCUTANEOUS
  Administered 2016-01-02: 15 [IU] via SUBCUTANEOUS
  Administered 2016-01-02: 07:00:00 7 [IU] via SUBCUTANEOUS
  Administered 2016-01-03: 4 [IU] via SUBCUTANEOUS
  Administered 2016-01-03: 15 [IU] via SUBCUTANEOUS
  Administered 2016-01-04: 3 [IU] via SUBCUTANEOUS
  Administered 2016-01-04: 7 [IU] via SUBCUTANEOUS
  Administered 2016-01-04: 11 [IU] via SUBCUTANEOUS

## 2015-12-30 MED ORDER — ALBUMIN HUMAN 25 % IV SOLN
INTRAVENOUS | Status: AC
  Filled 2015-12-30: qty 100

## 2015-12-30 MED ORDER — INSULIN GLARGINE 100 UNIT/ML ~~LOC~~ SOLN
20.0000 [IU] | Freq: Once | SUBCUTANEOUS | Status: AC
  Administered 2015-12-30: 20 [IU] via SUBCUTANEOUS
  Filled 2015-12-30: qty 0.2

## 2015-12-30 MED ORDER — SODIUM CHLORIDE 0.9 % IV SOLN
INTRAVENOUS | Status: DC
  Administered 2015-12-31: 06:00:00 via INTRAVENOUS

## 2015-12-30 MED ORDER — LIDOCAINE-PRILOCAINE 2.5-2.5 % EX CREA: 1.0000 "application " | TOPICAL_CREAM | CUTANEOUS | Status: DC | PRN

## 2015-12-30 MED ORDER — PREDNISONE 10 MG PO TABS
10.0000 mg | ORAL_TABLET | Freq: Every day | ORAL | Status: DC
  Administered 2015-12-31: 10 mg via ORAL
  Filled 2015-12-30: qty 1

## 2015-12-30 MED ORDER — ALTEPLASE 2 MG IJ SOLR: 2.0000 mg | Freq: Once | INTRAMUSCULAR | Status: DC | PRN

## 2015-12-30 MED ORDER — LIDOCAINE HCL (PF) 1 % IJ SOLN: 5.0000 mL | INTRAMUSCULAR | Status: DC | PRN

## 2015-12-30 MED ORDER — SODIUM CHLORIDE 0.9% FLUSH
3.0000 mL | Freq: Two times a day (BID) | INTRAVENOUS | Status: DC
  Administered 2015-12-31 – 2016-01-01 (×3): 3 mL via INTRAVENOUS

## 2015-12-30 MED ORDER — PENTAFLUOROPROP-TETRAFLUOROETH EX AERO: 1.0000 "application " | INHALATION_SPRAY | CUTANEOUS | Status: DC | PRN

## 2015-12-30 MED ORDER — ASPIRIN 81 MG PO CHEW
81.0000 mg | CHEWABLE_TABLET | ORAL | Status: AC
  Administered 2015-12-31: 81 mg via ORAL
  Filled 2015-12-30: qty 1

## 2015-12-30 NOTE — Progress Notes (Signed)
Inpatient Diabetes Program Recommendations  AACE/ADA: New Consensus Statement on Inpatient Glycemic Control (2015)  Target Ranges:  Prepandial:   less than 140 mg/dL      Peak postprandial:   less than 180 mg/dL (1-2 hours)      Critically ill patients:  140 - 180 mg/dL   Lab Results  Component Value Date   GLUCAP 236 (H) 12/30/2015   HGBA1C 6.6 (H) 02/25/2015    Review of Glycemic Control  Spoke with pt at length regarding his blood sugars over past 24H. Pt received 1/2 of basal dose of 24 units on 9/4 since pt NPO for procedure. Had no basal insulin on 9/4. Insulin drip started after BMET Glucose wase 601. Drip d/ced this morning with CO2 of 19 and AG of 16. Pt states he felt like he had the flu. Pt is Type 1 and PTA was on insulin pump with 39 units of basal insulin. Goal is 120 mg/dL, CHO ratio is 10 and CF is 20.   Recommendations: Lantus 20 units bid  Novolog decrease to moderate tidwc and hs Novolog 6 units tidwc for meal coverage insulin.  Will continue to follow. Thank you. Lorenda Peck, RD, LDN, CDE Inpatient Diabetes Coordinator [phone]

## 2015-12-30 NOTE — Progress Notes (Signed)
PROGRESS NOTE    Dale [name]  [id number] DOB: May 22, 1965 DOA: 12/21/2015 PCP: Dale Dales, MD    Brief Narrative: Dale [name] is a 50 y.o. male with PMH as outlined below including ESRD (M/W/F).  Per nephrology service, pt is extremely compliant with HD never missing any sessions.  On 8/27 he developed L arm pain which he initially thought was due to his "fistula not working".  Pain resolved spontaneously therefore he did feel that he needed to be evaluated.  The following morning 8/28, when he woke up to go to HD, pain was back and was worse.  He therefore went to ED for further evaluation.  In ED, he was found to have K 7, SCr 12.37, BUN 219, Hgb 6.9.  He was taken for urgent HD and after roughly 1 hour, he began to have diaphoresis, persistent L arm pain which radiated into chest, SOB, worsening anxiety.   PCCM was called and pt was emergently intubated while in HD unit.  He was transferred to ICU for further evaluation / management.  He is started on insulin drip the evening on 9/5 for elevated blood sugar, he is transitioned to subQ insulin 9/6 morning with taper steroids  Assessment & Plan:   Principal Problem:   NSTEMI (non-ST elevated myocardial infarction) ([place]) Active Problems:   S/P BKA (below knee amputation) ([place])   Anemia   HTN (hypertension)   Coronary artery disease involving native coronary artery of native heart with unstable angina pectoris ([place])   HLD (hyperlipidemia)   Gastrointestinal hemorrhage with melena   Encounter for intubation   Acute blood loss anemia   Uremia   Acute encephalopathy   Troponin level elevated   Encounter for central line placement   Stridor   Iron deficiency anemia   Anemia - suspect hemorrhagic due to GI bleed PPI oral BID.  Endoscopy 9-01; normal esophagus, erythematous mucosa in the cardia, gastric fundus and gastric body. No source for bleeding identified.  S/p prbc transfusion on 8/28, 9/1, 9/4 Unable to  have PRBC on 9/3 due to feeling of throat tightness, after transfusion stop patient improved.  hgb stable around 9 on 9/5 and 9/6  ESRD (M/W/F). Hyperkalemia - s/p emergent HD 8/28. Uremia - concern due to GI bleed. Hyponatremia. AGMA - uremia Improved with Dialysis.  Appreciate nephrology input  Acute hypoxic respiratory failure. He was intubated on 8/28 and extubated on 8/30, difficult intubation per critical care Wean oxygen,volume to be managed by dialysis  Stridor/horseness: He developed stridor on 9/3, critical care reconsulted, patient received iv steroids, nebs, no racemic epi needed, ct soft tissue neck no acute findings, does reveal trachoemalacia,  ENT consulted on 9/5, flex laryngoscope consistent with reflux and posterior left cord edema. Outpatient ENT follow up recommended  Hypotension - suspect hemorrhagic due to GI bleed along with component hypovolemia - Received levophed.  BP improved. Now elevated, he was not on bp meds prior to hospitalization, will continue dialysis for volume control, consider coreg if bp continue to be elevated, for now prn hydralazine, taper steroids  N-STEMI; Positive troponin;- suspect demand ischemia due to GI bleed.  Will consider NSTEMI given chest pain however vs demand ischemia ECHO; EF 65 %  Troponin: -7.9----18----5.4 Cardiology consulted , input appreciated Plan for cardiac cath on 9/7.     Hx HTN, HLD, CAD, dCHF (Echo Nov 2016 with EF 60-65%, G2DD).  Insulin dependent DM;  (on insulin pump at home, he did not use pump while  in the hospital)  He is started on insulin drip the evening on 9/5 for elevated blood sugar, he is transitioned to subQ insulin 9/6 morning with taper steroids  Acute encephalopathy improved Hx fibromyalgia. Chronic pain.  On gabapentin Started on ropinirole. Alert oriented.  On oral dilaudid at home.  IV dilaudid PRN.    Morbid obesity: Body mass index is 43.48 kg/m. cpap at night        DVT  prophylaxis: SCD.  Code Status: Full code.  Family Communication: Wife 9-02 Disposition Plan: Pulmonary evaluation, Blood transfusion.     Consultants:   CCM admitted patient. [facility] on 9[facility]1  GI  Nephrology   ENT  cardiology   Procedures:  Endoscopy 9-01  Flex laryngoscope on 9/5  Dialysis mwf  Cardiac cath planned for 9/7  Antimicrobials:   none   Subjective: He was started on insulin drip and received ivf last night, this am Complaining of difficulty breathing, audible wheezing, Speaking full sentences.  He is getting ready to be transported to dialysis unit now    Objective: Vitals:   12/30/15 1500 12/30/15 1530 12/30/15 1600 12/30/15 1625  BP: (!) 151/79 (!) 155/78 (!) 158/84 (!) 158/81  Pulse: 90 91 95 98  Resp: (!) 21 20 (!) 24 (!) 22  Temp:    98 F (36.7 C)  TempSrc:    Oral  SpO2: 100% 100% 100% 100%  Weight:    129.7 kg (285 lb 15 oz)  Height:        Intake/Output Summary (Last 24 hours) at 12/30/15 1756 Last data filed at 12/30/15 1625  Gross per 24 hour  Intake          1498.67 ml  Output             4500 ml  Net         -3001.33 ml   Filed Weights   12/30/15 0424 12/30/15 1225 12/30/15 1625  Weight: 133.2 kg (293 lb 9.6 oz) 134.7 kg (296 lb 15.4 oz) 129.7 kg (285 lb 15 oz)    Examination:  General exam: audible wheezing, DOE Respiratory system: diffuse wheezing, Cardiovascular system: S1 & S2 heard, RRR. No JVD, murmurs, rubs, gallops or clicks. No pedal edema. Gastrointestinal system: Abdomen is nondistended, soft and nontender. No organomegaly or masses felt. Normal bowel sounds heard. Central nervous system: Alert and oriented. No focal neurological deficits. Extremities: Symmetric 5 x 5 power. S/p left bka with prosthetic  Skin: No rashes, lesions or ulcers Psychiatry: Judgement and insight appear normal. Mood & affect appropriate.     Data Reviewed: I have personally reviewed following labs and imaging  studies  CBC:  Recent Labs Lab 12/25/15 0555  12/27/15 0350 12/28/15 0259 12/28/15 0809 12/28/15 2110 12/29/15 0502 12/30/15 0413  WBC 6.2  < > 7.4 5.8 5.9  --  9.1 9.7  NEUTROABS 4.6  --   --   --   --   --   --   --   HGB 6.7*  < > 7.3* 7.4* 7.1* 9.5* 9.0* 9.2*  HCT 21.4*  < > 22.5* 21.8* 20.8* 28.3* 27.1* 27.4*  MCV 99.1  < > 94.5 92.8 92.0  --  90.3 90.1  PLT 160  < > 217 234 240  --  244 267  < > = values in this interval not displayed. Basic Metabolic Panel:  Recent Labs Lab 12/26/15 0306  12/27/15 0350  12/28/15 0259 12/28/15 0809 12/28/15 1831 12/29/15 0502 12/29/15  1759 12/30/15 0033 12/30/15 0413 12/30/15 0500  NA 134*  < > 133*  < > 127* 127* 129* 130* 124* 126*  --  128*  K 3.4*  < > 3.7  < > 4.8 4.8 3.5 4.3 5.0 4.4  --  4.4  CL 97*  < > 95*  < > 90* 90* 96* 96* 91* 88*  --  93*  CO2 27  < > 26  < > 21* 22 24 23  18* 18*  --  19*  GLUCOSE 165*  < > 156*  < > 396* 447* 224* 201* 601* 356*  --  98  BUN 38*  < > 60*  < > 82* 88* 39* 54* 70* 79*  --  79*  CREATININE 4.95*  < > 7.39*  < > 9.77* 10.23* 5.56* 7.07* 8.42* 9.11*  --  8.99*  CALCIUM 8.3*  < > 8.5*  < > 8.3* 8.4* 8.4* 8.5* 8.3* 8.0*  --  8.6*  MG 2.1  --  2.2  --  2.2  --   --  2.1  --   --  2.2  --   PHOS 5.4*  < > 7.9*  < > 9.6* 10.0* 5.1* 7.1* 8.1*  --   --  9.0*  < > = values in this interval not displayed. GFR: Estimated Creatinine Clearance: 12.9 mL/min (by C-G formula based on SCr of 8.99 mg/dL). Liver Function Tests:  Recent Labs Lab 12/28/15 0809 12/28/15 1831 12/29/15 0502 12/29/15 1759 12/30/15 0500  ALBUMIN 2.7* 2.9* 2.9* 3.0* 3.0*   No results for input(s): LIPASE, AMYLASE in the last 168 hours. No results for input(s): AMMONIA in the last 168 hours. Coagulation Profile: No results for input(s): INR, PROTIME in the last 168 hours. Cardiac Enzymes: No results for input(s): CKTOTAL, CKMB, CKMBINDEX, TROPONINI in the last 168 hours. BNP (last 3 results) No results for  input(s): PROBNP in the last 8760 hours. HbA1C: No results for input(s): HGBA1C in the last 72 hours. CBG:  Recent Labs Lab 12/30/15 0852 12/30/15 0954 12/30/15 1121 12/30/15 1342 12/30/15 1715  GLUCAP 204* 210* 236* 122* 195*   Lipid Profile: No results for input(s): CHOL, HDL, LDLCALC, TRIG, CHOLHDL, LDLDIRECT in the last 72 hours. Thyroid Function Tests: No results for input(s): TSH, T4TOTAL, FREET4, T3FREE, THYROIDAB in the last 72 hours. Anemia Panel: No results for input(s): VITAMINB12, FOLATE, FERRITIN, TIBC, IRON, RETICCTPCT in the last 72 hours. Sepsis Labs: No results for input(s): PROCALCITON, LATICACIDVEN in the last 168 hours.  Recent Results (from the past 240 hour(s))  MRSA PCR Screening     Status: None   Collection Time: 12/21/15 11:39 AM  Result Value Ref Range Status   MRSA by PCR NEGATIVE NEGATIVE Final    Comment:        The GeneXpert MRSA Assay (FDA approved for NASAL specimens only), is one component of a comprehensive MRSA colonization surveillance program. It is not intended to diagnose MRSA infection nor to guide or monitor treatment for MRSA infections.          Radiology Studies: Nm Myocar Multi W/spect W/wall Motion / Ef  Result Date: 12/29/2015  There was no ST segment deviation noted during stress.  No T wave inversion was noted during stress.  Defect 1: There is a medium defect of moderate severity present in the mid anterior, mid anterolateral, apical anterior and apical lateral location.  Findings consistent with apical anterolateral ischemia and prior apical anterolateral myocardial infarction.  This is  an intermediate risk study.  The left ventricular ejection fraction is normal (55-65%).  Nuclear stress EF: 64%.         Scheduled Meds: . sodium chloride   Intravenous Once  . sodium chloride   Intravenous Once  . sodium chloride   Intravenous Once  . albumin human  25 g Intravenous Once  . aspirin EC  81 mg Oral QHS   . atorvastatin  80 mg Oral QHS  . feeding supplement (NEPRO CARB STEADY)  237 mL Oral Q24H  . fluticasone  1 spray Each Nare Daily  . gabapentin  200 mg Oral TID  . insulin aspart  0-20 Units Subcutaneous TID WC  . [START ON 12/31/2015] insulin aspart  3 Units Subcutaneous TID WC  . insulin glargine  20 Units Subcutaneous BID  . ipratropium-albuterol  3 mL Nebulization BID  . levothyroxine  100 mcg Oral QAC breakfast  . loratadine  5 mg Oral BID  . mouth rinse  15 mL Mouth Rinse BID  . pantoprazole  40 mg Oral BID AC  . [START ON 12/31/2015] predniSONE  10 mg Oral Q breakfast  . rOPINIRole  2 mg Oral Q supper  . sodium chloride flush  10-40 mL Intracatheter Q12H   Continuous Infusions:     LOS: 9 days    Time spent: 35 minutes.     Florencia Reasons, MD PhD [facility] Pager [phone]  If 7PM-7AM, please contact night-coverage [email or web address] Password [email or web address] 12/30/2015, 5:56 PM

## 2015-12-30 NOTE — Progress Notes (Signed)
Pt Blood sugar down to 116. Paged MD to see if we should stop insulin drip, MD plcd order to increase Dextrose 5%-0.45 % saline  to 125. Will continue to monitor blood sugar hourly.

## 2015-12-30 NOTE — Progress Notes (Signed)
SUBJECTIVE: No chest pain. Still with dyspnea.   Tele: sinus  BP (!) 144/70 (BP Location: Right Arm)   Pulse 90   Temp 98.2 F (36.8 C) (Oral)   Resp 20   Ht 5\' 8"  (1.727 m)   Wt 293 lb 9.6 oz (133.2 kg)   SpO2 100%   BMI 44.64 kg/m   Intake/Output Summary (Last 24 hours) at 12/30/15 0845 Last data filed at 12/30/15 0500  Gross per 24 hour  Intake          1198.67 ml  Output                0 ml  Net          1198.67 ml    PHYSICAL EXAM General: Obese male, appears dyspneic. Alert and oriented x 3.  Psych:  Good affect, responds appropriately Neck: No JVD. No masses noted.  Lungs: Clear bilaterally with no wheezes or rhonci noted. Upper airway wheezing/stridor Heart: RRR with no murmurs noted. Abdomen: Bowel sounds are present. Soft, non-tender.  Extremities: No right lower extremity edema. Left AKA  LABS: Basic Metabolic Panel:  Recent Labs  12/29/15 0502 12/29/15 1759 12/30/15 0033 12/30/15 0413 12/30/15 0500  NA 130* 124* 126*  --  128*  K 4.3 5.0 4.4  --  4.4  CL 96* 91* 88*  --  93*  CO2 23 18* 18*  --  19*  GLUCOSE 201* 601* 356*  --  98  BUN 54* 70* 79*  --  79*  CREATININE 7.07* 8.42* 9.11*  --  8.99*  CALCIUM 8.5* 8.3* 8.0*  --  8.6*  MG 2.1  --   --  2.2  --   PHOS 7.1* 8.1*  --   --  9.0*   CBC:  Recent Labs  12/29/15 0502 12/30/15 0413  WBC 9.1 9.7  HGB 9.0* 9.2*  HCT 27.1* 27.4*  MCV 90.3 90.1  PLT 244 267    Current Meds: . sodium chloride   Intravenous Once  . sodium chloride   Intravenous Once  . sodium chloride   Intravenous Once  . aspirin EC  81 mg Oral QHS  . atorvastatin  80 mg Oral QHS  . feeding supplement (NEPRO CARB STEADY)  237 mL Oral Q24H  . fluticasone  1 spray Each Nare Daily  . gabapentin  200 mg Oral TID  . insulin aspart  0-20 Units Subcutaneous TID WC  . insulin glargine  20 Units Subcutaneous Once  . ipratropium-albuterol  3 mL Nebulization BID  . levothyroxine  100 mcg Oral QAC breakfast  .  loratadine  5 mg Oral BID  . mouth rinse  15 mL Mouth Rinse BID  . pantoprazole  40 mg Oral BID AC  . predniSONE  20 mg Oral Q breakfast  . rOPINIRole  2 mg Oral Q supper  . sodium chloride flush  10-40 mL Intracatheter Q12H   Echo 12/23/15: Left ventricle: Systolic function was normal. The estimated   ejection fraction was in the range of 60% to 65%. Features are   consistent with a pseudonormal left ventricular filling pattern,   with concomitant abnormal relaxation and increased filling   pressure (grade 2 diastolic dysfunction).  Nuclear stress test: 12/30/15  There was no ST segment deviation noted during stress.  No T wave inversion was noted during stress.  Defect 1: There is a medium defect of moderate severity present in the mid anterior, mid anterolateral, apical anterior  and apical lateral location.  Findings consistent with apical anterolateral ischemia and prior apical anterolateral myocardial infarction.  This is an intermediate risk study.  The left ventricular ejection fraction is normal (55-65%).  Nuclear stress EF: 64%.  ASSESSMENT AND PLAN:  1. CAD/NSTEMI: He is known to have CAD with complex PCI of the heavily calcified RCA in December 2015 with rotablator atherectomy and placement of a DES in the mid RCA. He has moderate disease in the distal RCA post balloon angioplasty and moderately severe disease in the moderate caliber Diagonal branch. He has had no chest pain at home over the last year. His troponin as elevated in setting of profound anemia and metabolic derangements (hyperkalemia) leading to respiratory failure requiring intubation. He did have left arm pain on the day of admission. Echo 12/23/15 with normal LV function. Nuclear stress test 12/29/15 with possible anterior wall ischemia.  -I have discussed cardiac cath. He is still having issues with dyspnea related to his upper airway post extubation. I am not sure he could lay flat on the cath table today. Will  tentatively plan cath tomorrow. Would proceed with HD today. If we proceed with cath tomorrow, will need pre-treatment for dye allergy. He is on a steroid taper but will need additional dose of steroid tomorrow along with IV Benadryl and IV Pepcid. Will place on cath board for tomorrow but may change this depending his clinical status tomorrow.   2. ESRD: HD planned today.   3. Anemia: suspected due to GIB. He has been transfused. Endoscopy 9-01; normal esophagus, erythematous mucosa in the cardia, gastric fundus and gastric body. No source for bleeding identified. H/H is stable last 48 hours.   Lauree Chandler  9/6/20178:45 AM

## 2015-12-30 NOTE — Progress Notes (Signed)
[facility] Progress Note   Subjective: "I got fluids last night with the insulin drip", SOB today./ weighed standing and is 56kg   Vitals:   12/30/15 0424 12/30/15 0749 12/30/15 0800 12/30/15 1133  BP: (!) 129/56  (!) 144/70   Pulse: 87  90   Resp: (!) 25  20   Temp: 97.7 F (36.5 C)  98.2 F (36.8 C) 98.8 F (37.1 C)  TempSrc: Oral  Oral Oral  SpO2: 100% 100% 100% 100%  Weight: 133.2 kg (293 lb 9.6 oz)     Height:        Inpatient medications: . sodium chloride   Intravenous Once  . sodium chloride   Intravenous Once  . sodium chloride   Intravenous Once  . aspirin EC  81 mg Oral QHS  . atorvastatin  80 mg Oral QHS  . feeding supplement (NEPRO CARB STEADY)  237 mL Oral Q24H  . fluticasone  1 spray Each Nare Daily  . gabapentin  200 mg Oral TID  . insulin aspart  0-20 Units Subcutaneous TID WC  . ipratropium-albuterol  3 mL Nebulization BID  . levothyroxine  100 mcg Oral QAC breakfast  . loratadine  5 mg Oral BID  . mouth rinse  15 mL Mouth Rinse BID  . pantoprazole  40 mg Oral BID AC  . predniSONE  20 mg Oral Q breakfast  . rOPINIRole  2 mg Oral Q supper  . sodium chloride flush  10-40 mL Intracatheter Q12H   . sodium chloride Stopped (12/30/15 0233)  . insulin (NOVOLIN-R) infusion 1 Units/hr (12/30/15 0645)   sodium chloride, sodium chloride, diphenhydrAMINE, HYDROmorphone (DILAUDID) injection, HYDROmorphone, ondansetron (ZOFRAN) IV, phenol, promethazine **OR** promethazine, sodium chloride flush  Exam: No distress, calm Obese Chest clear bilat RRR no RG Abd marked obese, ntnd , no ascites LUA AVF +bruit/ IJ HD cath NF, Ox3 L BKA/ no LE edema  Dialysis: MWF [facility]    4h 23min  2/2.25 bath   127kg   Hep 7000  P4     IJ cath/ LUA AVF (have started using AVF at OP center approx 2 wks now, just got down to 15 ga w 2 needles)  No ESA, weekly Fe at center     Assessment 1. Acute GI Bleed- friable gastric mucosa on EGD w remnants from bleeding. S/P  prbc's (6-8 units total so far). PPI.   2. CP/ NSTEMI: peak trop 18, abnormal stress, possible cath tomorrow 3. Volume overload - up 8-9kg today, got IVF"s last night w IV insulin protocol which is not necessary and potentially harmful in ESRD pts 4. ESRD- MWF HD, has cath and using AVF at center, 15ga w 2 needles 5. ChronicHypotension-on midodrine pre HD 6. Anemia - not on ESA at center, started ESA now darbe 80 mg/ wk; tsat 34% 7. S/p VDRF 8. DM2 - on insulin 9. Resp failure/ VDRF - resolved  Plan - HD today , max UF, will possibly need further HD tomorrow  Kelly Splinter MD [facility] pager [phone]    cell [phone] 12/30/2015, 11:41 AM    Recent Labs Lab 12/29/15 0502 12/29/15 1759 12/30/15 0033 12/30/15 0500  NA 130* 124* 126* 128*  K 4.3 5.0 4.4 4.4  CL 96* 91* 88* 93*  CO2 23 18* 18* 19*  GLUCOSE 201* 601* 356* 98  BUN 54* 70* 79* 79*  CREATININE 7.07* 8.42* 9.11* 8.99*  CALCIUM 8.5* 8.3* 8.0* 8.6*  PHOS 7.1* 8.1*  --  9.0*    Recent Labs Lab 12/29/15 0502 12/29/15 1759 12/30/15 0500  ALBUMIN 2.9* 3.0* 3.0*    Recent Labs Lab 12/25/15 0555  12/28/15 0809 12/28/15 2110 12/29/15 0502 12/30/15 0413  WBC 6.2  < > 5.9  --  9.1 9.7  NEUTROABS 4.6  --   --   --   --   --   HGB 6.7*  < > 7.1* 9.5* 9.0* 9.2*  HCT 21.4*  < > 20.8* 28.3* 27.1* 27.4*  MCV 99.1  < > 92.0  --  90.3 90.1  PLT 160  < > 240  --  244 267  < > = values in this interval not displayed. Iron/TIBC/Ferritin/ %Sat    Component Value Date/Time   IRON 57 05/11/2012 1551   TIBC 168 (L) 05/11/2012 1551   FERRITIN 350 (H) 05/11/2012 1551   IRONPCTSAT 34 05/11/2012 1551

## 2015-12-30 NOTE — Progress Notes (Signed)
Physical Therapy Treatment Patient Details Name: Dale [name] MRN: [id number] DOB: 11/21/1965 Today's Date: 12/30/2015    History of Present Illness Patient is a 50 yo male admitted 12/21/15 with anemia, poss GIB, Hyperkalemia.  Patient less responsive and intubated 12/21/15.  Patient with anemia, acute encephalopathy, VDRF.  Extubated 12/23/15.      PMH:  ESRD on HD, morbid obesity, Lt BKA    PT Comments    Pt ambulated 150' with RW and min-guard A with 2 seated rest breaks and O2 sats as low as 78% on 3L O2, DOE 2-3/4. Pt fatigues quickly with all activity and upper airway wheezing heard. Breathing improves with vc's for pursed lip exhale. PT will continue to follow.   Follow Up Recommendations  Home health PT;Supervision for mobility/OOB     Equipment Recommendations  None recommended by PT    Recommendations for Other Services       Precautions / Restrictions Precautions Precautions: Fall Required Braces or Orthoses: Other Brace/Splint Other Brace/Splint: Lt BKA prosthesis Restrictions Weight Bearing Restrictions: No LLE Weight Bearing: Weight bearing as tolerated    Mobility  Bed Mobility               General bed mobility comments: Sitting EOB upon arrival  Transfers Overall transfer level: Needs assistance Equipment used: Rolling walker (2 wheeled) Transfers: Sit to/from Stand Sit to Stand: Min guard         General transfer comment: pt with 2/4 DOE after donning prosthesis and shoe, increased effort for sit to stand with dyspnea  Ambulation/Gait Ambulation/Gait assistance: Min guard Ambulation Distance (Feet): 150 Feet (2 seated rest breaks) Assistive device: Rolling walker (2 wheeled) Gait Pattern/deviations: Step-through pattern;Wide base of support;Trunk flexed Gait velocity: decreased Gait velocity interpretation: Below normal speed for age/gender General Gait Details: cues for pursed lip breathing, O2 sats as low as 78%, back up to 100% end of  session on 2 L O2. HR 97 bpm. AMbulated on 3L O2. Audible cracking of right knee with ambulation (ambulated on 2 liters supplemental O2)   Stairs            Wheelchair Mobility    Modified Rankin (Stroke Patients Only)       Balance Overall balance assessment: Needs assistance Sitting-balance support: No upper extremity supported Sitting balance-Leahy Scale: Good Sitting balance - Comments: pt able to don prosthesis EOB with no LOB, however, near LOB to right with crossing right ankle over left knee. Self correction   Standing balance support: Single extremity supported Standing balance-Leahy Scale: Poor Standing balance comment: reliant on stable surface for standing balance                    Cognition Arousal/Alertness: Awake/alert Behavior During Therapy: WFL for tasks assessed/performed Overall Cognitive Status: Within Functional Limits for tasks assessed                      Exercises      General Comments General comments (skin integrity, edema, etc.): pt with increased dyspnea with all mobility, especially ambulation      Pertinent Vitals/Pain Pain Assessment: No/denies pain    Home Living                      Prior Function            PT Goals (current goals can now be found in the care plan section) Acute Rehab PT Goals Patient Stated  Goal: to get stronger PT Goal Formulation: With patient Time For Goal Achievement: 01/02/16 Potential to Achieve Goals: Good Progress towards PT goals: Progressing toward goals    Frequency  Min 3X/week    PT Plan Current plan remains appropriate    Co-evaluation             End of Session Equipment Utilized During Treatment: Gait belt;Oxygen Activity Tolerance: Patient tolerated treatment well;Patient limited by fatigue (hard stress test today) Patient left: in bed;with call bell/phone within reach (sitting EOB)     Time: [id number] PT Time Calculation (min) (ACUTE ONLY): 36  min  Charges:  $Gait Training: 23-37 mins                    G Codes:     Leighton Roach, PT  Acute Rehab Services  [place], [place] 12/30/2015, 10:30 AM

## 2015-12-31 LAB — RENAL FUNCTION PANEL
Albumin: 2.7 g/dL — ABNORMAL LOW (ref 3.5–5.0)
Albumin: 2.7 g/dL — ABNORMAL LOW (ref 3.5–5.0)
Anion gap: 12 (ref 5–15)
Anion gap: 8 (ref 5–15)
BUN: 38 mg/dL — ABNORMAL HIGH (ref 6–20)
BUN: 23 mg/dL — ABNORMAL HIGH (ref 6–20)
CO2: 25 mmol/L (ref 22–32)
CO2: 26 mmol/L (ref 22–32)
Calcium: 8 mg/dL — ABNORMAL LOW (ref 8.9–10.3)
Calcium: 8.1 mg/dL — ABNORMAL LOW (ref 8.9–10.3)
Chloride: 93 mmol/L — ABNORMAL LOW (ref 101–111)
Chloride: 97 mmol/L — ABNORMAL LOW (ref 101–111)
Creatinine, Ser: 5.66 mg/dL — ABNORMAL HIGH (ref 0.61–1.24)
Creatinine, Ser: 3.92 mg/dL — ABNORMAL HIGH (ref 0.61–1.24)
GFR calc Af Amer: 12 mL/min — ABNORMAL LOW (ref >60)
GFR calc Af Amer: 19 mL/min — ABNORMAL LOW (ref >60)
GFR calc non Af Amer: 11 mL/min — ABNORMAL LOW (ref >60)
GFR calc non Af Amer: 16 mL/min — ABNORMAL LOW (ref >60)
Glucose, Bld: 261 mg/dL — ABNORMAL HIGH (ref 65–99)
Glucose, Bld: 110 mg/dL — ABNORMAL HIGH (ref 65–99)
Phosphorus: 6.3 mg/dL — ABNORMAL HIGH (ref 2.5–4.6)
Phosphorus: 3.8 mg/dL (ref 2.5–4.6)
Potassium: 3.7 mmol/L (ref 3.5–5.1)
Potassium: 3.9 mmol/L (ref 3.5–5.1)
Sodium: 130 mmol/L — ABNORMAL LOW (ref 135–145)
Sodium: 131 mmol/L — ABNORMAL LOW (ref 135–145)

## 2015-12-31 LAB — CBC
HCT: 28 % — ABNORMAL LOW (ref 39.0–52.0)
Hemoglobin: 9.1 g/dL — ABNORMAL LOW (ref 13.0–17.0)
MCH: 29.9 pg (ref 26.0–34.0)
MCHC: 32.5 g/dL (ref 30.0–36.0)
MCV: 92.1 fL (ref 78.0–100.0)
Platelets: 238 10*3/uL (ref 150–400)
RBC: 3.04 MIL/uL — ABNORMAL LOW (ref 4.22–5.81)
RDW: 17 % — ABNORMAL HIGH (ref 11.5–15.5)
WBC: 6.4 10*3/uL (ref 4.0–10.5)

## 2015-12-31 LAB — GLUCOSE, CAPILLARY
Glucose-Capillary: 211 mg/dL — ABNORMAL HIGH (ref 65–99)
Glucose-Capillary: 230 mg/dL — ABNORMAL HIGH (ref 65–99)
Glucose-Capillary: 256 mg/dL — ABNORMAL HIGH (ref 65–99)
Glucose-Capillary: 82 mg/dL (ref 65–99)
Glucose-Capillary: 153 mg/dL — ABNORMAL HIGH (ref 65–99)

## 2015-12-31 LAB — HEMOGLOBIN A1C
Hgb A1c MFr Bld: 6.3 % — ABNORMAL HIGH (ref 4.8–5.6)
Mean Plasma Glucose: 134 mg/dL

## 2015-12-31 MED ORDER — FAMOTIDINE IN NACL 20-0.9 MG/50ML-% IV SOLN
20.0000 mg | Freq: Once | INTRAVENOUS | Status: AC
Start: 2016-01-01 — End: 2016-01-01
  Administered 2016-01-01: 20 mg via INTRAVENOUS
  Filled 2015-12-31: qty 50

## 2015-12-31 MED ORDER — LIDOCAINE HCL (PF) 1 % IJ SOLN: 5.0000 mL | INTRAMUSCULAR | Status: DC | PRN

## 2015-12-31 MED ORDER — INSULIN ASPART 100 UNIT/ML ~~LOC~~ SOLN
6.0000 [IU] | Freq: Three times a day (TID) | SUBCUTANEOUS | Status: DC
  Administered 2016-01-01 – 2016-01-04 (×7): 6 [IU] via SUBCUTANEOUS

## 2015-12-31 MED ORDER — LIDOCAINE-PRILOCAINE 2.5-2.5 % EX CREA
1.0000 "application " | TOPICAL_CREAM | CUTANEOUS | Status: DC | PRN
  Filled 2015-12-31: qty 5

## 2015-12-31 MED ORDER — HEPARIN SODIUM (PORCINE) 1000 UNIT/ML DIALYSIS
1000.0000 [IU] | INTRAMUSCULAR | Status: DC | PRN
  Filled 2015-12-31: qty 1

## 2015-12-31 MED ORDER — PENTAFLUOROPROP-TETRAFLUOROETH EX AERO: 1.0000 "application " | INHALATION_SPRAY | CUTANEOUS | Status: DC | PRN

## 2015-12-31 MED ORDER — ALTEPLASE 2 MG IJ SOLR
2.0000 mg | Freq: Once | INTRAMUSCULAR | Status: DC | PRN
Start: 2015-12-31 — End: 2015-12-31

## 2015-12-31 MED ORDER — ALBUTEROL SULFATE (2.5 MG/3ML) 0.083% IN NEBU
5.0000 mg | INHALATION_SOLUTION | Freq: Once | RESPIRATORY_TRACT | Status: AC
  Administered 2015-12-31: 5 mg via RESPIRATORY_TRACT
  Filled 2015-12-31: qty 6

## 2015-12-31 MED ORDER — SODIUM CHLORIDE 0.9 % IV SOLN: 100.0000 mL | INTRAVENOUS | Status: DC | PRN

## 2015-12-31 MED ORDER — IPRATROPIUM BROMIDE 0.02 % IN SOLN
0.5000 mg | Freq: Once | RESPIRATORY_TRACT | Status: AC
  Administered 2015-12-31: 0.5 mg via RESPIRATORY_TRACT
  Filled 2015-12-31: qty 2.5

## 2015-12-31 MED ORDER — HEPARIN SODIUM (PORCINE) 1000 UNIT/ML DIALYSIS
5000.0000 [IU] | Freq: Once | INTRAMUSCULAR | Status: DC
  Filled 2015-12-31: qty 5

## 2015-12-31 MED ORDER — METHYLPREDNISOLONE SODIUM SUCC 125 MG IJ SOLR
125.0000 mg | Freq: Once | INTRAMUSCULAR | Status: AC
  Administered 2016-01-01: 125 mg via INTRAVENOUS
  Filled 2015-12-31: qty 2

## 2015-12-31 MED ORDER — DIPHENHYDRAMINE HCL 50 MG/ML IJ SOLN
50.0000 mg | Freq: Once | INTRAMUSCULAR | Status: AC
  Administered 2016-01-01: 50 mg via INTRAVENOUS
  Filled 2015-12-31: qty 1

## 2015-12-31 MED ORDER — INSULIN GLARGINE 100 UNIT/ML ~~LOC~~ SOLN
22.0000 [IU] | Freq: Two times a day (BID) | SUBCUTANEOUS | Status: DC
  Administered 2015-12-31 – 2016-01-04 (×6): 22 [IU] via SUBCUTANEOUS
  Filled 2015-12-31 (×9): qty 0.22

## 2015-12-31 NOTE — Progress Notes (Signed)
SUBJECTIVE: Breathing is better this am. No pain.   Tele: sinus  BP (!) 130/54 (BP Location: Right Arm)   Pulse 96   Temp 97.8 F (36.6 C) (Oral)   Resp (!) 33   Ht 5\' 8"  (1.727 m)   Wt 283 lb 9.6 oz (128.6 kg)   SpO2 98%   BMI 43.12 kg/m   Intake/Output Summary (Last 24 hours) at 12/31/15 0824 Last data filed at 12/30/15 1831  Gross per 24 hour  Intake              720 ml  Output             4500 ml  Net            -3780 ml    PHYSICAL EXAM General: Obese WM. Appears comfortable. Alert and oriented x 3.  Psych:  Good affect, responds appropriately Neck: Cannot assess JVD. No masses noted.  Lungs: Clear bilaterally with no wheezes or rhonci noted.  Heart: RRR with no murmurs noted. Abdomen: Bowel sounds are present. Soft, non-tender.  Extremities: No right lower extremity edema. Left AKA  LABS: Basic Metabolic Panel:  Recent Labs  12/29/15 0502  12/30/15 0413  12/30/15 1711 12/31/15 0248  NA 130*  < >  --   < > 130* 130*  K 4.3  < >  --   < > 4.0 3.7  CL 96*  < >  --   < > 93* 93*  CO2 23  < >  --   < > 25 25  GLUCOSE 201*  < >  --   < > 197* 261*  BUN 54*  < >  --   < > 31* 38*  CREATININE 7.07*  < >  --   < > 4.71* 5.66*  CALCIUM 8.5*  < >  --   < > 8.0* 8.0*  MG 2.1  --  2.2  --   --   --   PHOS 7.1*  < >  --   < > 4.9* 6.3*  < > = values in this interval not displayed. CBC:  Recent Labs  12/29/15 0502 12/30/15 0413  WBC 9.1 9.7  HGB 9.0* 9.2*  HCT 27.1* 27.4*  MCV 90.3 90.1  PLT 244 267    Current Meds: . albumin human  25 g Intravenous Once  . aspirin EC  81 mg Oral QHS  . atorvastatin  80 mg Oral QHS  . feeding supplement (NEPRO CARB STEADY)  237 mL Oral Q24H  . fluticasone  1 spray Each Nare Daily  . gabapentin  200 mg Oral TID  . insulin aspart  0-20 Units Subcutaneous TID WC  . insulin aspart  3 Units Subcutaneous TID WC  . insulin glargine  20 Units Subcutaneous BID  . ipratropium-albuterol  3 mL Nebulization BID  .  levothyroxine  100 mcg Oral QAC breakfast  . loratadine  5 mg Oral BID  . pantoprazole  40 mg Oral BID AC  . predniSONE  10 mg Oral Q breakfast  . rOPINIRole  2 mg Oral Q supper  . sodium chloride flush  10-40 mL Intracatheter Q12H  . sodium chloride flush  3 mL Intravenous Q12H     ASSESSMENT AND PLAN:  1. CAD/NSTEMI: He is known to have CAD with complex PCI of the heavily calcified RCA in December 2015 with rotablator atherectomy and placement of a DES in the mid RCA. He has moderate residual  disease in the distal RCA post balloon angioplasty and moderately severe disease in the moderate caliber Diagonal branch. He has had no chest pain at home over the last year. His troponin was elevated in setting of profound anemia and metabolic derangements (hyperkalemia) leading to respiratory failure requiring intubation at time of admission. He did have left arm pain on the day of admission. Echo 12/23/15 with normal LV function. Nuclear stress test 12/29/15 with possible anterior wall ischemia.  -I have discussed cardiac cath. He is still having issues with dyspnea related to his upper airway post extubation. He was volume overloaded yesterday, underwent HD but needs HD again today. I have discussed this with Nephrology. Will plan HD again today and plan cardiac cath tomorrow at 9am with Dr. Claiborne Billings. He will need pre-treatment for dye allergy tomorrow. He is finishing a steroid taper today. I will order additional dose of IV steroid tomorrow am along with IV Benadryl and IV Pepcid. Repeat H/H in the am. Resume renal diet today. NPO at midnight.   2. ESRD: HD planned today.   3. Anemia: suspected due to friable gastric mucosa on EGD 12/25/15. No active bleeding. He has been transfused and H/H is stable.     Dale [name]  9/7/20178:24 AM

## 2015-12-31 NOTE — Progress Notes (Signed)
[facility] Progress Note   Subjective: breathing better, today, 4 1/2 L off w HD yest  Vitals:   12/31/15 0924 12/31/15 0931 12/31/15 1014 12/31/15 1121  BP:    (!) 129/59  Pulse:  93 98 94  Resp:  (!) 23 (!) 27 (!) 26  Temp:    98.2 F (36.8 C)  TempSrc:    Oral  SpO2: 99% 100% 100% 97%  Weight:      Height:        Inpatient medications: . aspirin EC  81 mg Oral QHS  . atorvastatin  80 mg Oral QHS  . [START ON 01/01/2016] diphenhydrAMINE  50 mg Intravenous Once  . [START ON 01/01/2016] famotidine (PEPCID) IV  20 mg Intravenous Once  . feeding supplement (NEPRO CARB STEADY)  237 mL Oral Q24H  . fluticasone  1 spray Each Nare Daily  . gabapentin  200 mg Oral TID  . heparin  5,000 Units Dialysis Once in dialysis  . insulin aspart  0-20 Units Subcutaneous TID WC  . insulin aspart  6 Units Subcutaneous TID WC  . insulin glargine  22 Units Subcutaneous BID  . ipratropium-albuterol  3 mL Nebulization BID  . levothyroxine  100 mcg Oral QAC breakfast  . loratadine  5 mg Oral BID  . [START ON 01/01/2016] methylPREDNISolone (SOLU-MEDROL) injection  125 mg Intravenous Once  . pantoprazole  40 mg Oral BID AC  . predniSONE  10 mg Oral Q breakfast  . rOPINIRole  2 mg Oral Q supper  . sodium chloride flush  10-40 mL Intracatheter Q12H  . sodium chloride flush  3 mL Intravenous Q12H   . sodium chloride 10 mL/hr at 12/31/15 0607   sodium chloride, sodium chloride, sodium chloride, alteplase, alum & mag hydroxide-simeth, diphenhydrAMINE, heparin, hydrALAZINE, HYDROmorphone (DILAUDID) injection, HYDROmorphone, lidocaine (PF), lidocaine-prilocaine, ondansetron (ZOFRAN) IV, pentafluoroprop-tetrafluoroeth, phenol, promethazine **OR** promethazine, sodium chloride flush, sodium chloride flush  Exam: No distress, calm Obese Chest clear bilat RRR no RG Abd marked obese, ntnd , no ascites LUA AVF +bruit/ IJ HD cath NF, Ox3 L BKA/ no LE edema  Dialysis: MWF [facility]    4h 76min   2/2.25 bath   127kg   Hep 7000  P4     IJ cath/ LUA AVF (have started using AVF at OP center approx 2 wks now, just got down to 15 ga w 2 needles)  No ESA, weekly Fe at center     Assessment 1. Acute GI Bleed- friable gastric mucosa on EGD w remnants from bleeding. S/P prbc's (6-8 units total so far). PPI.   2. CP/ NSTEMI: peak trop 18, abnormal stress, possible cath tomorrow 3. Volume overload - better, plan HD again today , get vol down further 4. ESRD- MWF HD, has cath and using AVF at center, 15ga w 2 needles 5. ChronicHypotension-on midodrine pre HD 6. Anemia - not on ESA at center, started ESA now darbe 80 mg/ wk; tsat 34% 7. S/p VDRF 8. DM2 - on insulin 9. Resp failure/ VDRF - resolved  Plan - HD again today, then prob cath tomorrow. Have d/w cardiology.   Kelly Splinter MD [facility] pager [phone]    cell [phone] 12/31/2015, 12:36 PM    Recent Labs Lab 12/30/15 0500 12/30/15 1711 12/31/15 0248  NA 128* 130* 130*  K 4.4 4.0 3.7  CL 93* 93* 93*  CO2 19* 25 25  GLUCOSE 98 197* 261*  BUN 79* 31* 38*  CREATININE 8.99* 4.71*  5.66*  CALCIUM 8.6* 8.0* 8.0*  PHOS 9.0* 4.9* 6.3*    Recent Labs Lab 12/30/15 0500 12/30/15 1711 12/31/15 0248  ALBUMIN 3.0* 3.1* 2.7*    Recent Labs Lab 12/25/15 0555  12/28/15 0809 12/28/15 2110 12/29/15 0502 12/30/15 0413  WBC 6.2  < > 5.9  --  9.1 9.7  NEUTROABS 4.6  --   --   --   --   --   HGB 6.7*  < > 7.1* 9.5* 9.0* 9.2*  HCT 21.4*  < > 20.8* 28.3* 27.1* 27.4*  MCV 99.1  < > 92.0  --  90.3 90.1  PLT 160  < > 240  --  244 267  < > = values in this interval not displayed. Iron/TIBC/Ferritin/ %Sat    Component Value Date/Time   IRON 57 05/11/2012 1551   TIBC 168 (L) 05/11/2012 1551   FERRITIN 350 (H) 05/11/2012 1551   IRONPCTSAT 34 05/11/2012 1551

## 2015-12-31 NOTE — Progress Notes (Signed)
PROGRESS NOTE    Dale [name]  NLG:[id number] DOB: 07-04-1965 DOA: 12/21/2015 PCP: [name], MD    Brief Narrative: Dale [name] is a 50 y.o. male with PMH as outlined below including ESRD (M/W/F).  Per nephrology service, pt is extremely compliant with HD never missing any sessions.  On 8/27 he developed L arm pain which he initially thought was due to his "fistula not working".  Pain resolved spontaneously therefore he did feel that he needed to be evaluated.  The following morning 8/28, when he woke up to go to HD, pain was back and was worse.  He therefore went to ED for further evaluation.  In ED, he was found to have K 7, SCr 12.37, BUN 219, Hgb 6.9.  He was taken for urgent HD and after roughly 1 hour, he began to have diaphoresis, persistent L arm pain which radiated into chest, SOB, worsening anxiety.   PCCM was called and pt was emergently intubated while in HD unit.  He was transferred to ICU for further evaluation / management.  He is started on insulin drip the evening on 9/5 for elevated blood sugar, he is transitioned to subQ insulin 9/6 morning with taper steroids  Assessment & Plan:   Principal Problem:   NSTEMI (non-ST elevated myocardial infarction) ([place]) Active Problems:   S/P BKA (below knee amputation) ([place])   Anemia   HTN (hypertension)   Coronary artery disease involving native coronary artery of native heart with unstable angina pectoris ([place])   HLD (hyperlipidemia)   Gastrointestinal hemorrhage with melena   Encounter for intubation   Acute blood loss anemia   Uremia   Acute encephalopathy   Troponin level elevated   Encounter for central line placement   Stridor   Iron deficiency anemia   Anemia - suspect hemorrhagic due to GI bleed PPI oral BID.  Endoscopy 9-01; normal esophagus, erythematous mucosa in the cardia, gastric fundus and gastric body. No source for bleeding identified.  S/p prbc transfusion on 8/28, 9/1, 9/4 Unable to  have PRBC on 9/3 due to feeling of throat tightness, after transfusion stop patient improved.  hgb stable around 9 on 9/5 9/6  ESRD (M/W/F). Hyperkalemia - s/p emergent HD 8/28. Uremia - concern due to GI bleed. Hyponatremia. AGMA - uremia Improved with Dialysis.  Appreciate nephrology input  Acute hypoxic respiratory failure. He was intubated on 8/28 and extubated on 8/30, difficult intubation per critical care Wean oxygen,volume to be managed by dialysis  Stridor/horseness: He developed stridor on 9/3, critical care reconsulted, patient received iv steroids, nebs, no racemic epi needed, ct soft tissue neck no acute findings, does reveal trachoemalacia,  ENT consulted on 9/5, flex laryngoscope consistent with reflux and posterior left cord edema. Outpatient ENT follow up recommended  Hypotension - suspect hemorrhagic due to GI bleed along with component hypovolemia - Received levophed.  BP improved. Now elevated, he was not on bp meds prior to hospitalization, will continue dialysis for volume control, consider coreg if bp continue to be elevated, for now prn hydralazine, taper steroids  N-STEMI; Positive troponin;- suspect demand ischemia due to GI bleed.  Will consider NSTEMI given chest pain however vs demand ischemia ECHO; EF 65 %  Troponin: -7.9----18----5.4 Cardiology consulted , input appreciated Plan for cardiac cath on 9/7.     Hx HTN, HLD, CAD, dCHF (Echo Nov 2016 with EF 60-65%, G2DD).  Insulin dependent DM;  (on insulin pump at home, he did not use pump while in  the hospital)  He is started on insulin drip the evening on 9/5 for elevated blood sugar, he is transitioned to subQ insulin 9/6 morning with taper steroids  Acute encephalopathy improved Hx fibromyalgia. Chronic pain.  On gabapentin Started on ropinirole. Alert oriented.  On oral dilaudid at home.  IV dilaudid PRN.    Morbid obesity: Body mass index is 43.12 kg/m. cpap at night        DVT  prophylaxis: SCD.  Code Status: Full code.  Family Communication: Wife 9-02 Disposition Plan: pending cardiac catheterization.     Consultants:   CCM admitted patient. Transferred to [facility] on 9/1  GI  Nephrology   ENT  cardiology   Procedures:  Endoscopy 9-01  Flex laryngoscope on 9/5  Dialysis mwf  Cardiac cath planned for 9/8  Antimicrobials:   none   Subjective: Feeling better today, getting additional HD today, plan to have cardiac cath tomorrow Has some intermittent dry cough, no fever, no wheezing today, no chest pain    Objective: Vitals:   12/30/15 2325 12/30/15 2354 12/31/15 0529 12/31/15 0740  BP:  (!) 127/55 (!) 129/57 (!) 130/54  Pulse:  90 90 96  Resp:  (!) 22 (!) 26 (!) 33  Temp:  98 F (36.7 C) 98 F (36.7 C) 97.8 F (36.6 C)  TempSrc:  Oral Oral Oral  SpO2: 98% 100% 100% 98%  Weight:   128.6 kg (283 lb 9.6 oz)   Height:        Intake/Output Summary (Last 24 hours) at 12/31/15 0817 Last data filed at 12/30/15 1831  Gross per 24 hour  Intake              720 ml  Output             4500 ml  Net            -3780 ml   Filed Weights   12/30/15 1225 12/30/15 1625 12/31/15 0529  Weight: 134.7 kg (296 lb 15.4 oz) 129.7 kg (285 lb 15 oz) 128.6 kg (283 lb 9.6 oz)    Examination:  General exam: NAD Respiratory system: diminished, wheezing has resolved Cardiovascular system: S1 & S2 heard, RRR. No JVD, murmurs, rubs, gallops or clicks. No pedal edema. Gastrointestinal system: Abdomen is nondistended, soft and nontender. No organomegaly or masses felt. Normal bowel sounds heard. Central nervous system: Alert and oriented. No focal neurological deficits. Extremities: Symmetric 5 x 5 power. S/p left bka with prosthetic  Skin: No rashes, lesions or ulcers Psychiatry: Judgement and insight appear normal. Mood & affect appropriate.     Data Reviewed: I have personally reviewed following labs and imaging studies  CBC:  Recent Labs Lab  12/25/15 0555  12/27/15 0350 12/28/15 0259 12/28/15 0809 12/28/15 2110 12/29/15 0502 12/30/15 0413  WBC 6.2  < > 7.4 5.8 5.9  --  9.1 9.7  NEUTROABS 4.6  --   --   --   --   --   --   --   HGB 6.7*  < > 7.3* 7.4* 7.1* 9.5* 9.0* 9.2*  HCT 21.4*  < > 22.5* 21.8* 20.8* 28.3* 27.1* 27.4*  MCV 99.1  < > 94.5 92.8 92.0  --  90.3 90.1  PLT 160  < > 217 234 240  --  244 267  < > = values in this interval not displayed. Basic Metabolic Panel:  Recent Labs Lab 12/26/15 0306  12/27/15 0350  12/28/15 0259  12/29/15 0502 12/29/15 1759  12/30/15 0033 12/30/15 0413 12/30/15 0500 12/30/15 1711 12/31/15 0248  NA 134*  < > 133*  < > 127*  < > 130* 124* 126*  --  128* 130* 130*  K 3.4*  < > 3.7  < > 4.8  < > 4.3 5.0 4.4  --  4.4 4.0 3.7  CL 97*  < > 95*  < > 90*  < > 96* 91* 88*  --  93* 93* 93*  CO2 27  < > 26  < > 21*  < > 23 18* 18*  --  19* 25 25  GLUCOSE 165*  < > 156*  < > 396*  < > 201* 601* 356*  --  98 197* 261*  BUN 38*  < > 60*  < > 82*  < > 54* 70* 79*  --  79* 31* 38*  CREATININE 4.95*  < > 7.39*  < > 9.77*  < > 7.07* 8.42* 9.11*  --  8.99* 4.71* 5.66*  CALCIUM 8.3*  < > 8.5*  < > 8.3*  < > 8.5* 8.3* 8.0*  --  8.6* 8.0* 8.0*  MG 2.1  --  2.2  --  2.2  --  2.1  --   --  2.2  --   --   --   PHOS 5.4*  < > 7.9*  < > 9.6*  < > 7.1* 8.1*  --   --  9.0* 4.9* 6.3*  < > = values in this interval not displayed. GFR: Estimated Creatinine Clearance: 20.4 mL/min (by C-G formula based on SCr of 5.66 mg/dL). Liver Function Tests:  Recent Labs Lab 12/29/15 0502 12/29/15 1759 12/30/15 0500 12/30/15 1711 12/31/15 0248  ALBUMIN 2.9* 3.0* 3.0* 3.1* 2.7*   No results for input(s): LIPASE, AMYLASE in the last 168 hours. No results for input(s): AMMONIA in the last 168 hours. Coagulation Profile: No results for input(s): INR, PROTIME in the last 168 hours. Cardiac Enzymes: No results for input(s): CKTOTAL, CKMB, CKMBINDEX, TROPONINI in the last 168 hours. BNP (last 3 results) No  results for input(s): PROBNP in the last 8760 hours. HbA1C:  Recent Labs  12/30/15 0413  HGBA1C 6.3*   CBG:  Recent Labs Lab 12/30/15 1342 12/30/15 1715 12/30/15 2059 12/31/15 0606 12/31/15 0727  GLUCAP 122* 195* 223* 211* 230*   Lipid Profile: No results for input(s): CHOL, HDL, LDLCALC, TRIG, CHOLHDL, LDLDIRECT in the last 72 hours. Thyroid Function Tests: No results for input(s): TSH, T4TOTAL, FREET4, T3FREE, THYROIDAB in the last 72 hours. Anemia Panel: No results for input(s): VITAMINB12, FOLATE, FERRITIN, TIBC, IRON, RETICCTPCT in the last 72 hours. Sepsis Labs: No results for input(s): PROCALCITON, LATICACIDVEN in the last 168 hours.  Recent Results (from the past 240 hour(s))  MRSA PCR Screening     Status: None   Collection Time: 12/21/15 11:39 AM  Result Value Ref Range Status   MRSA by PCR NEGATIVE NEGATIVE Final    Comment:        The GeneXpert MRSA Assay (FDA approved for NASAL specimens only), is one component of a comprehensive MRSA colonization surveillance program. It is not intended to diagnose MRSA infection nor to guide or monitor treatment for MRSA infections.          Radiology Studies: Nm Myocar Multi W/spect W/wall Motion / Ef  Result Date: 12/29/2015  There was no ST segment deviation noted during stress.  No T wave inversion was noted during stress.  Defect 1: There is a  medium defect of moderate severity present in the mid anterior, mid anterolateral, apical anterior and apical lateral location.  Findings consistent with apical anterolateral ischemia and prior apical anterolateral myocardial infarction.  This is an intermediate risk study.  The left ventricular ejection fraction is normal (55-65%).  Nuclear stress EF: 64%.         Scheduled Meds: . albumin human  25 g Intravenous Once  . aspirin EC  81 mg Oral QHS  . atorvastatin  80 mg Oral QHS  . feeding supplement (NEPRO CARB STEADY)  237 mL Oral Q24H  . fluticasone   1 spray Each Nare Daily  . gabapentin  200 mg Oral TID  . insulin aspart  0-20 Units Subcutaneous TID WC  . insulin aspart  3 Units Subcutaneous TID WC  . insulin glargine  20 Units Subcutaneous BID  . ipratropium-albuterol  3 mL Nebulization BID  . levothyroxine  100 mcg Oral QAC breakfast  . loratadine  5 mg Oral BID  . pantoprazole  40 mg Oral BID AC  . predniSONE  10 mg Oral Q breakfast  . rOPINIRole  2 mg Oral Q supper  . sodium chloride flush  10-40 mL Intracatheter Q12H  . sodium chloride flush  3 mL Intravenous Q12H   Continuous Infusions: . sodium chloride 10 mL/hr at 12/31/15 0607     LOS: 10 days    Time spent: 35 minutes.     Florencia Reasons, MD PhD [facility] Pager [phone]  If 7PM-7AM, please contact night-coverage [email or web address] Password [email or web address] 12/31/2015, 8:17 AM

## 2015-12-31 NOTE — Plan of Care (Signed)
Problem: Respiratory: Goal: Ability to maintain a clear airway and adequate ventilation will improve Outcome: Progressing O2 Sats = 95 - 100% on Room Air at Rest.

## 2015-12-31 NOTE — Care Management Important Message (Signed)
Important Message  Patient Details  Name: Dale [name] MRN: [id number] Date of Birth: 1965/12/16   Medicare Important Message Given:  Yes    Nathen May 12/31/2015, 10:46 AM

## 2015-12-31 NOTE — Progress Notes (Signed)
Inpatient Diabetes Program Recommendations  AACE/ADA: New Consensus Statement on Inpatient Glycemic Control (2015)  Target Ranges:  Prepandial:   less than 140 mg/dL      Peak postprandial:   less than 180 mg/dL (1-2 hours)      Critically ill patients:  140 - 180 mg/dL   Lab Results  Component Value Date   GLUCAP 256 (H) 12/31/2015   HGBA1C 6.3 (H) 12/30/2015   Results for TRINTEN, BOUDOIN (MRN [id number]) as of 12/31/2015 11:38  Ref. Range 12/30/2015 20:59 12/31/2015 06:06 12/31/2015 07:27 12/31/2015 11:07  Glucose-Capillary Latest Ref Range: 65 - 99 mg/dL 223 (H) 211 (H) 230 (H) 256 (H)   Review of Glycemic Control  Post-prandial blood sugars elevated. Needs increase in meal coverage insulin.  Recommendations: Increase Novolog to 6 units tidwc for meal coverage insulin. Increase Lantus to 22 units bid.  Will continue to follow.  Thank you. Lorenda Peck, RD, LDN, CDE Inpatient Diabetes Coordinator [phone]

## 2016-01-01 ENCOUNTER — Encounter (HOSPITAL_COMMUNITY)
Admission: EM | Disposition: A | Discharge status: Home Health | Payer: Self-pay | Source: Home / Self Care | Attending: Internal Medicine

## 2016-01-01 ENCOUNTER — Encounter (HOSPITAL_COMMUNITY): Payer: Self-pay | Admitting: Cardiology

## 2016-01-01 ENCOUNTER — Inpatient Hospital Stay (HOSPITAL_COMMUNITY): Payer: Medicare Other

## 2016-01-01 ENCOUNTER — Other Ambulatory Visit: Payer: Self-pay

## 2016-01-01 HISTORY — PX: CARDIAC CATHETERIZATION: SHX172

## 2016-01-01 LAB — RENAL FUNCTION PANEL
Albumin: 2.7 g/dL — ABNORMAL LOW (ref 3.5–5.0)
Albumin: 2.6 g/dL — ABNORMAL LOW (ref 3.5–5.0)
Anion gap: 10 (ref 5–15)
Anion gap: 14 (ref 5–15)
BUN: 32 mg/dL — ABNORMAL HIGH (ref 6–20)
BUN: 44 mg/dL — ABNORMAL HIGH (ref 6–20)
CO2: 26 mmol/L (ref 22–32)
CO2: 19 mmol/L — ABNORMAL LOW (ref 22–32)
Calcium: 8.4 mg/dL — ABNORMAL LOW (ref 8.9–10.3)
Calcium: 8.3 mg/dL — ABNORMAL LOW (ref 8.9–10.3)
Chloride: 98 mmol/L — ABNORMAL LOW (ref 101–111)
Chloride: 95 mmol/L — ABNORMAL LOW (ref 101–111)
Creatinine, Ser: 5.32 mg/dL — ABNORMAL HIGH (ref 0.61–1.24)
Creatinine, Ser: 6.31 mg/dL — ABNORMAL HIGH (ref 0.61–1.24)
GFR calc Af Amer: 13 mL/min — ABNORMAL LOW (ref >60)
GFR calc Af Amer: 11 mL/min — ABNORMAL LOW (ref >60)
GFR calc non Af Amer: 11 mL/min — ABNORMAL LOW (ref >60)
GFR calc non Af Amer: 9 mL/min — ABNORMAL LOW (ref >60)
Glucose, Bld: 135 mg/dL — ABNORMAL HIGH (ref 65–99)
Glucose, Bld: 305 mg/dL — ABNORMAL HIGH (ref 65–99)
Phosphorus: 4.5 mg/dL (ref 2.5–4.6)
Phosphorus: 6.3 mg/dL — ABNORMAL HIGH (ref 2.5–4.6)
Potassium: 3.7 mmol/L (ref 3.5–5.1)
Potassium: 5.5 mmol/L — ABNORMAL HIGH (ref 3.5–5.1)
Sodium: 134 mmol/L — ABNORMAL LOW (ref 135–145)
Sodium: 128 mmol/L — ABNORMAL LOW (ref 135–145)

## 2016-01-01 LAB — CBC
HCT: 29.5 % — ABNORMAL LOW (ref 39.0–52.0)
HCT: 27.5 % — ABNORMAL LOW (ref 39.0–52.0)
Hemoglobin: 9.5 g/dL — ABNORMAL LOW (ref 13.0–17.0)
Hemoglobin: 8.9 g/dL — ABNORMAL LOW (ref 13.0–17.0)
MCH: 29.9 pg (ref 26.0–34.0)
MCH: 29.8 pg (ref 26.0–34.0)
MCHC: 32.2 g/dL (ref 30.0–36.0)
MCHC: 32.4 g/dL (ref 30.0–36.0)
MCV: 92.8 fL (ref 78.0–100.0)
MCV: 92 fL (ref 78.0–100.0)
Platelets: 263 10*3/uL (ref 150–400)
Platelets: 277 10*3/uL (ref 150–400)
RBC: 3.18 MIL/uL — ABNORMAL LOW (ref 4.22–5.81)
RBC: 2.99 MIL/uL — ABNORMAL LOW (ref 4.22–5.81)
RDW: 16.8 % — ABNORMAL HIGH (ref 11.5–15.5)
RDW: 16.5 % — ABNORMAL HIGH (ref 11.5–15.5)
WBC: 8.3 10*3/uL (ref 4.0–10.5)
WBC: 12.3 10*3/uL — ABNORMAL HIGH (ref 4.0–10.5)

## 2016-01-01 LAB — GLUCOSE, CAPILLARY
Glucose-Capillary: 109 mg/dL — ABNORMAL HIGH (ref 65–99)
Glucose-Capillary: 149 mg/dL — ABNORMAL HIGH (ref 65–99)
Glucose-Capillary: 269 mg/dL — ABNORMAL HIGH (ref 65–99)
Glucose-Capillary: 286 mg/dL — ABNORMAL HIGH (ref 65–99)
Glucose-Capillary: 208 mg/dL — ABNORMAL HIGH (ref 65–99)

## 2016-01-01 LAB — POCT ACTIVATED CLOTTING TIME
Activated Clotting Time: 312 seconds
Activated Clotting Time: 180 seconds
Activated Clotting Time: 175 seconds

## 2016-01-01 LAB — TYPE AND SCREEN
ABO/RH(D): A POS
Antibody Screen: NEGATIVE

## 2016-01-01 LAB — MAGNESIUM: Magnesium: 2 mg/dL (ref 1.7–2.4)

## 2016-01-01 LAB — POTASSIUM: Potassium: 4.8 mmol/L (ref 3.5–5.1)

## 2016-01-01 SURGERY — LEFT HEART CATH AND CORONARY ANGIOGRAPHY
Anesthesia: LOCAL

## 2016-01-01 MED ORDER — IOPAMIDOL (ISOVUE-370) INJECTION 76%
INTRAVENOUS | Status: AC
  Filled 2016-01-01: qty 50

## 2016-01-01 MED ORDER — SODIUM CHLORIDE 0.9 % IV SOLN: 250.0000 mL | INTRAVENOUS | Status: DC | PRN

## 2016-01-01 MED ORDER — PREDNISONE 10 MG PO TABS
10.0000 mg | ORAL_TABLET | Freq: Every day | ORAL | Status: DC
  Administered 2016-01-03: 10 mg via ORAL
  Filled 2016-01-01: qty 1

## 2016-01-01 MED ORDER — TICAGRELOR 90 MG PO TABS
ORAL_TABLET | ORAL | Status: AC
  Filled 2016-01-01: qty 1

## 2016-01-01 MED ORDER — IPRATROPIUM-ALBUTEROL 0.5-2.5 (3) MG/3ML IN SOLN
3.0000 mL | Freq: Four times a day (QID) | RESPIRATORY_TRACT | Status: DC | PRN
  Administered 2016-01-01 – 2016-01-03 (×2): 3 mL via RESPIRATORY_TRACT

## 2016-01-01 MED ORDER — HEPARIN (PORCINE) IN NACL 2-0.9 UNIT/ML-% IJ SOLN
INTRAMUSCULAR | Status: AC
  Filled 2016-01-01: qty 500

## 2016-01-01 MED ORDER — SODIUM CHLORIDE 0.9 % IV SOLN: 100.0000 mL | INTRAVENOUS | Status: DC | PRN

## 2016-01-01 MED ORDER — TICAGRELOR 90 MG PO TABS
ORAL_TABLET | ORAL | Status: DC | PRN
  Administered 2016-01-01: 180 mg via ORAL

## 2016-01-01 MED ORDER — FENTANYL CITRATE (PF) 100 MCG/2ML IJ SOLN
INTRAMUSCULAR | Status: AC
  Filled 2016-01-01: qty 2

## 2016-01-01 MED ORDER — OXYMETAZOLINE HCL 0.05 % NA SOLN
1.0000 | Freq: Two times a day (BID) | NASAL | Status: DC
  Administered 2016-01-02: 1 via NASAL
  Filled 2016-01-01: qty 15

## 2016-01-01 MED ORDER — IPRATROPIUM-ALBUTEROL 0.5-2.5 (3) MG/3ML IN SOLN: 3.0000 mL | Freq: Three times a day (TID) | RESPIRATORY_TRACT | Status: DC

## 2016-01-01 MED ORDER — NITROGLYCERIN 1 MG/10 ML FOR IR/CATH LAB
INTRA_ARTERIAL | Status: DC | PRN
  Administered 2016-01-01 (×2): 200 ug via INTRACORONARY

## 2016-01-01 MED ORDER — METOPROLOL TARTRATE 12.5 MG HALF TABLET
12.5000 mg | ORAL_TABLET | Freq: Two times a day (BID) | ORAL | Status: DC
  Administered 2016-01-01 – 2016-01-04 (×6): 12.5 mg via ORAL
  Filled 2016-01-01 (×6): qty 1

## 2016-01-01 MED ORDER — FENTANYL CITRATE (PF) 100 MCG/2ML IJ SOLN
INTRAMUSCULAR | Status: DC | PRN
  Administered 2016-01-01: 25 ug via INTRAVENOUS

## 2016-01-01 MED ORDER — IOPAMIDOL (ISOVUE-370) INJECTION 76%
INTRAVENOUS | Status: AC
  Filled 2016-01-01: qty 100

## 2016-01-01 MED ORDER — TRIPLE ANTIBIOTIC 3.5-400-5000 EX OINT
1.0000 "application " | TOPICAL_OINTMENT | Freq: Once | CUTANEOUS | Status: DC | PRN
  Filled 2016-01-01: qty 1

## 2016-01-01 MED ORDER — BIVALIRUDIN 250 MG IV SOLR
INTRAVENOUS | Status: AC
  Filled 2016-01-01: qty 250

## 2016-01-01 MED ORDER — LIDOCAINE HCL (PF) 1 % IJ SOLN
INTRAMUSCULAR | Status: DC | PRN
Start: 2016-01-01 — End: 2016-01-01
  Administered 2016-01-01: 20 mL

## 2016-01-01 MED ORDER — PENTAFLUOROPROP-TETRAFLUOROETH EX AERO: 1.0000 "application " | INHALATION_SPRAY | CUTANEOUS | Status: DC | PRN

## 2016-01-01 MED ORDER — MIDAZOLAM HCL 2 MG/2ML IJ SOLN
INTRAMUSCULAR | Status: DC | PRN
  Administered 2016-01-01: 2 mg via INTRAVENOUS

## 2016-01-01 MED ORDER — SALINE SPRAY 0.65 % NA SOLN
2.0000 | NASAL | Status: DC | PRN
  Filled 2016-01-01: qty 44

## 2016-01-01 MED ORDER — ASPIRIN 81 MG PO CHEW
81.0000 mg | CHEWABLE_TABLET | Freq: Every day | ORAL | Status: DC
  Administered 2016-01-02 – 2016-01-04 (×3): 81 mg via ORAL
  Filled 2016-01-01 (×3): qty 1

## 2016-01-01 MED ORDER — SILVER NITRATE-POT NITRATE 75-25 % EX MISC
10.0000 | Freq: Once | CUTANEOUS | Status: DC | PRN
  Filled 2016-01-01: qty 10

## 2016-01-01 MED ORDER — NITROGLYCERIN 1 MG/10 ML FOR IR/CATH LAB
INTRA_ARTERIAL | Status: AC
  Filled 2016-01-01: qty 10

## 2016-01-01 MED ORDER — ONDANSETRON HCL 4 MG/2ML IJ SOLN: 4.0000 mg | Freq: Four times a day (QID) | INTRAMUSCULAR | Status: DC | PRN

## 2016-01-01 MED ORDER — LIDOCAINE-EPINEPHRINE (PF) 1 %-1:200000 IJ SOLN
0.0000 mL | Freq: Once | INTRAMUSCULAR | Status: DC | PRN
  Filled 2016-01-01: qty 30

## 2016-01-01 MED ORDER — SODIUM CHLORIDE 0.9% FLUSH
3.0000 mL | Freq: Two times a day (BID) | INTRAVENOUS | Status: DC
  Administered 2016-01-01 – 2016-01-04 (×3): 3 mL via INTRAVENOUS

## 2016-01-01 MED ORDER — TICAGRELOR 90 MG PO TABS
90.0000 mg | ORAL_TABLET | Freq: Two times a day (BID) | ORAL | Status: DC
  Administered 2016-01-01 – 2016-01-04 (×5): 90 mg via ORAL
  Filled 2016-01-01 (×5): qty 1

## 2016-01-01 MED ORDER — ATORVASTATIN CALCIUM 80 MG PO TABS
80.0000 mg | ORAL_TABLET | Freq: Every day | ORAL | Status: DC
  Administered 2016-01-01 – 2016-01-04 (×4): 80 mg via ORAL
  Filled 2016-01-01 (×4): qty 1

## 2016-01-01 MED ORDER — HEPARIN (PORCINE) IN NACL 2-0.9 UNIT/ML-% IJ SOLN
INTRAMUSCULAR | Status: DC | PRN
  Administered 2016-01-01: 12:00:00

## 2016-01-01 MED ORDER — ACETAMINOPHEN 325 MG PO TABS: 650.0000 mg | ORAL_TABLET | ORAL | Status: DC | PRN

## 2016-01-01 MED ORDER — OXYMETAZOLINE HCL 0.05 % NA SOLN
1.0000 | Freq: Once | NASAL | Status: DC | PRN
  Filled 2016-01-01: qty 15

## 2016-01-01 MED ORDER — BIVALIRUDIN BOLUS VIA INFUSION - CUPID
INTRAVENOUS | Status: DC | PRN
  Administered 2016-01-01: 95.55 mg via INTRAVENOUS

## 2016-01-01 MED ORDER — SODIUM CHLORIDE 0.9% FLUSH: 3.0000 mL | INTRAVENOUS | Status: DC | PRN

## 2016-01-01 MED ORDER — MENTHOL 3 MG MT LOZG
1.0000 | LOZENGE | OROMUCOSAL | Status: DC | PRN
  Filled 2016-01-01: qty 9

## 2016-01-01 MED ORDER — IOPAMIDOL (ISOVUE-370) INJECTION 76%
INTRAVENOUS | Status: DC | PRN
  Administered 2016-01-01: 230 mL via INTRA_ARTERIAL

## 2016-01-01 MED ORDER — IPRATROPIUM-ALBUTEROL 0.5-2.5 (3) MG/3ML IN SOLN
3.0000 mL | Freq: Two times a day (BID) | RESPIRATORY_TRACT | Status: DC
  Administered 2016-01-01 – 2016-01-04 (×5): 3 mL via RESPIRATORY_TRACT
  Filled 2016-01-01 (×6): qty 3

## 2016-01-01 MED ORDER — MIDAZOLAM HCL 2 MG/2ML IJ SOLN
INTRAMUSCULAR | Status: AC
  Filled 2016-01-01: qty 2

## 2016-01-01 MED ORDER — LIDOCAINE HCL 4 % EX SOLN
0.0000 mL | Freq: Once | CUTANEOUS | Status: DC | PRN
  Filled 2016-01-01: qty 50

## 2016-01-01 MED ORDER — HEPARIN (PORCINE) IN NACL 2-0.9 UNIT/ML-% IJ SOLN
INTRAMUSCULAR | Status: AC
  Filled 2016-01-01: qty 1000

## 2016-01-01 MED ORDER — LIDOCAINE HCL (PF) 1 % IJ SOLN
INTRAMUSCULAR | Status: AC
  Filled 2016-01-01: qty 30

## 2016-01-01 MED ORDER — SODIUM CHLORIDE 0.9% FLUSH
3.0000 mL | Freq: Two times a day (BID) | INTRAVENOUS | Status: DC
  Administered 2016-01-01 – 2016-01-04 (×2): 3 mL via INTRAVENOUS

## 2016-01-01 MED ORDER — LIDOCAINE HCL 2 % EX GEL
1.0000 "application " | Freq: Once | CUTANEOUS | Status: DC | PRN
  Filled 2016-01-01: qty 5

## 2016-01-01 MED ORDER — SODIUM CHLORIDE 0.9 % IV SOLN
INTRAVENOUS | Status: DC | PRN
  Administered 2016-01-01: 0.25 mg/kg/h via INTRAVENOUS

## 2016-01-01 SURGICAL SUPPLY — 17 items
BALLN EMERGE MR 2.0X12 (BALLOONS) ×2
BALLN ~~LOC~~ TREK RX 2.5X8 (BALLOONS) ×2
BALLOON EMERGE MR 2.0X12 (BALLOONS) IMPLANT
BALLOON ~~LOC~~ TREK RX 2.5X8 (BALLOONS) IMPLANT
CATH IMPULSE 5F ANG/FL3.5 (CATHETERS) ×1 IMPLANT
CATH VISTA GUIDE 6FR XBLAD3.5 (CATHETERS) ×1 IMPLANT
HOVERMATT SINGLE USE (MISCELLANEOUS) ×1 IMPLANT
KIT ENCORE 26 ADVANTAGE (KITS) ×1 IMPLANT
KIT HEART LEFT (KITS) ×2 IMPLANT
PACK CARDIAC CATHETERIZATION (CUSTOM PROCEDURE TRAY) ×2 IMPLANT
SHEATH PINNACLE 5F 10CM (SHEATH) ×1 IMPLANT
SHEATH PINNACLE 6F 10CM (SHEATH) ×1 IMPLANT
STENT XIENCE ALPINE RX 2.25X15 (Permanent Stent) ×1 IMPLANT
TRANSDUCER W/STOPCOCK (MISCELLANEOUS) ×2 IMPLANT
TUBING CIL FLEX 10 FLL-RA (TUBING) ×2 IMPLANT
WIRE COUGAR XT STRL 300CM (WIRE) ×1 IMPLANT
WIRE EMERALD 3MM-J .035X150CM (WIRE) ×1 IMPLANT

## 2016-01-01 NOTE — Progress Notes (Signed)
PROGRESS NOTE    Dale [name]  MEQ:[id number] DOB: 1965/08/28 DOA: 12/21/2015 PCP: [name], MD    Brief Narrative: Dale [name] is a 50 y.o. male with PMH as outlined below including ESRD (M/W/F).  Per nephrology service, pt is extremely compliant with HD never missing any sessions.  On 8/27 he developed L arm pain which he initially thought was due to his "fistula not working".  Pain resolved spontaneously therefore he did feel that he needed to be evaluated.  The following morning 8/28, when he woke up to go to HD, pain was back and was worse.  He therefore went to ED for further evaluation.  In ED, he was found to have K 7, SCr 12.37, BUN 219, Hgb 6.9.  He was taken for urgent HD and after roughly 1 hour, he began to have diaphoresis, persistent L arm pain which radiated into chest, SOB, worsening anxiety.   PCCM was called and pt was emergently intubated while in HD unit.  He was transferred to ICU for further evaluation / management.  He is started on insulin drip the evening on 9/5 for elevated blood sugar, he is transitioned to subQ insulin 9/6 morning with taper steroids  Assessment & Plan:   Principal Problem:   NSTEMI (non-ST elevated myocardial infarction) ([place]) Active Problems:   S/P BKA (below knee amputation) ([place])   Anemia   HTN (hypertension)   Coronary artery disease involving native coronary artery of native heart with unstable angina pectoris ([place])   HLD (hyperlipidemia)   Gastrointestinal hemorrhage with melena   Encounter for intubation   Acute blood loss anemia   Uremia   Acute encephalopathy   Troponin level elevated   Encounter for central line placement   Stridor   Iron deficiency anemia   Anemia - suspect hemorrhagic due to GI bleed PPI oral BID.  Endoscopy 9-01; normal esophagus, erythematous mucosa in the cardia, gastric fundus and gastric body. No source for bleeding identified.  S/p prbc transfusion on 8/28, 9/1, 9/4 Unable to  have PRBC on 9/3 due to feeling of throat tightness, after transfusion stop patient improved.  hgb stable around 9 on 9/5 9/6  ESRD (M/W/F). Hyperkalemia - s/p emergent HD 8/28. Uremia - concern due to GI bleed. Hyponatremia. AGMA - uremia Improved with Dialysis.  Appreciate nephrology input  Acute hypoxic respiratory failure. He was intubated on 8/28 and extubated on 8/30, difficult intubation per critical care Wean oxygen,volume to be managed by dialysis  Stridor/horseness: He developed stridor on 9/3, critical care reconsulted, patient received iv steroids, nebs, no racemic epi needed, ct soft tissue neck no acute findings, does reveal trachoemalacia,  ENT consulted on 9/5, flex laryngoscope consistent with reflux and posterior left cord edema. Outpatient ENT follow up recommended  Hypotension - suspect hemorrhagic due to GI bleed along with component hypovolemia - Received levophed.  BP improved. Now elevated, he was not on bp meds prior to hospitalization, will continue dialysis for volume control, consider coreg if bp continue to be elevated, for now prn hydralazine, taper steroids  N-STEMI; Positive troponin;- suspect demand ischemia due to GI bleed.  Will consider NSTEMI given chest pain however vs demand ischemia ECHO; EF 65 %  Troponin: -7.9----18----5.4 Cardiology consulted , input appreciated cardiac cath with DES on 9/8.   On DAPT.  Hx HTN, HLD, CAD, dCHF (Echo Nov 2016 with EF 60-65%, G2DD).  Insulin dependent DM;  (on insulin pump at home, he did not use pump while  in the hospital)  He is started on insulin drip the evening on 9/5 for elevated blood sugar, he is transitioned to subQ insulin 9/6 morning with taper steroids  Acute encephalopathy improved Hx fibromyalgia. Chronic pain.  On gabapentin Started on ropinirole. Alert oriented.  On oral dilaudid at home.  IV dilaudid PRN.    Morbid obesity: Body mass index is 42.7 kg/m. cpap at night  Epistaixs:  post cath, topical afrin for now, monitor cbc closely, transfuse prn , ENT consulted.  Hypoxia: likely related to poor wave form, patient denies chest pain or sob at rest, but will get cxr in this complicate patient who has tendency for fluids retention.        DVT prophylaxis: SCD.  Code Status: Full code.  Family Communication: Wife 9-02, 9/8 Disposition Plan:  Need cardiology and nephrology clearance     Consultants:   CCM admitted patient. Transferred to [facility] on 9/1  GI  Nephrology   ENT on 9/5 for hoarseness, ENT reconsulted on 9/8 for epistaxis  cardiology   Procedures:  Endoscopy 9-01  Flex laryngoscope on 9/5  Dialysis mwf  Cardiac cath with DEs on 9/8  Antimicrobials:   none   Subjective: Patient seen after cath, having nose bleed,  wife in room    Objective: Vitals:   01/01/16 1205 01/01/16 1210 01/01/16 1215 01/01/16 1243  BP: (!) 162/81 (!) 151/86 (!) 156/77 (!) 171/55  Pulse: (!) 101 (!) 103 (!) 116 98  Resp: 20 17 11  (!) 21  Temp:    97.7 F (36.5 C)  TempSrc:    Oral  SpO2: 100% 100% 100% 100%  Weight:      Height:        Intake/Output Summary (Last 24 hours) at 01/01/16 1358 Last data filed at 01/01/16 0800  Gross per 24 hour  Intake           688.83 ml  Output             3500 ml  Net         -2811.17 ml   Filed Weights   12/31/15 1300 12/31/15 1648 01/01/16 0355  Weight: 132.5 kg (292 lb 1.8 oz) 129 kg (284 lb 6.3 oz) 127.4 kg (280 lb 12.8 oz)    Examination:  General exam: active nose bleed. obese Respiratory system: diminished, wheezing has resolved Cardiovascular system: S1 & S2 heard, RRR. No JVD, murmurs, rubs, gallops or clicks. No pedal edema. Gastrointestinal system: Abdomen is nondistended, soft and nontender. No organomegaly or masses felt. Normal bowel sounds heard. Central nervous system: Alert and oriented. No focal neurological deficits. Extremities: Symmetric 5 x 5 power. S/p left bka with prosthetic    Skin: No rashes, lesions or ulcers Psychiatry: Judgement and insight appear normal. Mood & affect appropriate.     Data Reviewed: I have personally reviewed following labs and imaging studies  CBC:  Recent Labs Lab 12/28/15 0809 12/28/15 2110 12/29/15 0502 12/30/15 0413 12/31/15 1400 01/01/16 0514  WBC 5.9  --  9.1 9.7 6.4 8.3  HGB 7.1* 9.5* 9.0* 9.2* 9.1* 9.5*  HCT 20.8* 28.3* 27.1* 27.4* 28.0* 29.5*  MCV 92.0  --  90.3 90.1 92.1 92.8  PLT 240  --  244 267 238 756   Basic Metabolic Panel:  Recent Labs Lab 12/27/15 0350  12/28/15 0259  12/29/15 0502  12/30/15 0413 12/30/15 0500 12/30/15 1711 12/31/15 0248 12/31/15 1833 01/01/16 0514  NA 133*  < > 127*  < > 130*  < >  --  128* 130* 130* 131* 134*  K 3.7  < > 4.8  < > 4.3  < >  --  4.4 4.0 3.7 3.9 3.7  CL 95*  < > 90*  < > 96*  < >  --  93* 93* 93* 97* 98*  CO2 26  < > 21*  < > 23  < >  --  19* 25 25 26 26   GLUCOSE 156*  < > 396*  < > 201*  < >  --  98 197* 261* 110* 135*  BUN 60*  < > 82*  < > 54*  < >  --  79* 31* 38* 23* 32*  CREATININE 7.39*  < > 9.77*  < > 7.07*  < >  --  8.99* 4.71* 5.66* 3.92* 5.32*  CALCIUM 8.5*  < > 8.3*  < > 8.5*  < >  --  8.6* 8.0* 8.0* 8.1* 8.4*  MG 2.2  --  2.2  --  2.1  --  2.2  --   --   --   --  2.0  PHOS 7.9*  < > 9.6*  < > 7.1*  < >  --  9.0* 4.9* 6.3* 3.8 4.5  < > = values in this interval not displayed. GFR: Estimated Creatinine Clearance: 21.6 mL/min (by C-G formula based on SCr of 5.32 mg/dL). Liver Function Tests:  Recent Labs Lab 12/30/15 0500 12/30/15 1711 12/31/15 0248 12/31/15 1833 01/01/16 0514  ALBUMIN 3.0* 3.1* 2.7* 2.7* 2.7*   No results for input(s): LIPASE, AMYLASE in the last 168 hours. No results for input(s): AMMONIA in the last 168 hours. Coagulation Profile: No results for input(s): INR, PROTIME in the last 168 hours. Cardiac Enzymes: No results for input(s): CKTOTAL, CKMB, CKMBINDEX, TROPONINI in the last 168 hours. BNP (last 3 results) No  results for input(s): PROBNP in the last 8760 hours. HbA1C:  Recent Labs  12/30/15 0413  HGBA1C 6.3*   CBG:  Recent Labs Lab 12/31/15 1107 12/31/15 1725 12/31/15 2101 01/01/16 0733 01/01/16 0923  GLUCAP 256* 82 153* 109* 149*   Lipid Profile: No results for input(s): CHOL, HDL, LDLCALC, TRIG, CHOLHDL, LDLDIRECT in the last 72 hours. Thyroid Function Tests: No results for input(s): TSH, T4TOTAL, FREET4, T3FREE, THYROIDAB in the last 72 hours. Anemia Panel: No results for input(s): VITAMINB12, FOLATE, FERRITIN, TIBC, IRON, RETICCTPCT in the last 72 hours. Sepsis Labs: No results for input(s): PROCALCITON, LATICACIDVEN in the last 168 hours.  No results found for this or any previous visit (from the past 240 hour(s)).       Radiology Studies: No results found.      Scheduled Meds: . [START ON 01/02/2016] aspirin  81 mg Oral Daily  . atorvastatin  80 mg Oral q1800  . feeding supplement (NEPRO CARB STEADY)  237 mL Oral Q24H  . fluticasone  1 spray Each Nare Daily  . gabapentin  200 mg Oral TID  . insulin aspart  0-20 Units Subcutaneous TID WC  . insulin aspart  6 Units Subcutaneous TID WC  . insulin glargine  22 Units Subcutaneous BID  . ipratropium-albuterol  3 mL Nebulization BID  . levothyroxine  100 mcg Oral QAC breakfast  . loratadine  5 mg Oral BID  . metoprolol tartrate  12.5 mg Oral BID  . pantoprazole  40 mg Oral BID AC  . [START ON 01/02/2016] predniSONE  10 mg Oral Q breakfast  . rOPINIRole  2 mg Oral Q supper  . sodium  chloride flush  3 mL Intravenous Q12H  . sodium chloride flush  3 mL Intravenous Q12H  . ticagrelor  90 mg Oral BID   Continuous Infusions:     LOS: 11 days    Time spent: 35 minutes.     Florencia Reasons, MD PhD [facility] Pager [phone]  If 7PM-7AM, please contact night-coverage [email or web address] Password [email or web address] 01/01/2016, 1:58 PM

## 2016-01-01 NOTE — Progress Notes (Signed)
SUBJECTIVE: No chest pain  Tele: sinus  BP (!) 137/58 (BP Location: Right Arm)   Pulse 94   Temp 97.7 F (36.5 C) (Oral)   Resp (!) 22   Ht 5\' 8"  (1.727 m)   Wt 280 lb 12.8 oz (127.4 kg)   SpO2 98%   BMI 42.70 kg/m   Intake/Output Summary (Last 24 hours) at 01/01/16 0715 Last data filed at 12/31/15 2100  Gross per 24 hour  Intake          1168.83 ml  Output             3500 ml  Net         -2331.17 ml    PHYSICAL EXAM General: Obese WM. NAD. Still has stridor type upper airway sounds. Alert but drowsy post Benadryl  Psych:  Good affect, responds appropriately Neck: Cannot assess for JVD due to size of neck. No masses noted.  Lungs: Clear bilaterally with no wheezes or rhonci noted. He has upper airway stridor that is unchanged.  Heart: RRR with no murmurs noted. Abdomen: Bowel sounds are present. Soft, non-tender.  Extremities: No right lower extremity edema. Left LE amputation  LABS: Basic Metabolic Panel:  Recent Labs  12/30/15 0413  12/31/15 1833 01/01/16 0514  NA  --   < > 131* 134*  K  --   < > 3.9 3.7  CL  --   < > 97* 98*  CO2  --   < > 26 26  GLUCOSE  --   < > 110* 135*  BUN  --   < > 23* 32*  CREATININE  --   < > 3.92* 5.32*  CALCIUM  --   < > 8.1* 8.4*  MG 2.2  --   --  2.0  PHOS  --   < > 3.8 4.5  < > = values in this interval not displayed. CBC:  Recent Labs  12/31/15 1400 01/01/16 0514  WBC 6.4 PENDING  HGB 9.1* 9.5*  HCT 28.0* 29.5*  MCV 92.1 92.8  PLT 238 263   Current Meds: . aspirin EC  81 mg Oral QHS  . atorvastatin  80 mg Oral QHS  . feeding supplement (NEPRO CARB STEADY)  237 mL Oral Q24H  . fluticasone  1 spray Each Nare Daily  . gabapentin  200 mg Oral TID  . insulin aspart  0-20 Units Subcutaneous TID WC  . insulin aspart  6 Units Subcutaneous TID WC  . insulin glargine  22 Units Subcutaneous BID  . ipratropium-albuterol  3 mL Nebulization BID  . levothyroxine  100 mcg Oral QAC breakfast  . loratadine  5 mg Oral  BID  . pantoprazole  40 mg Oral BID AC  . predniSONE  10 mg Oral Q breakfast  . rOPINIRole  2 mg Oral Q supper  . sodium chloride flush  3 mL Intravenous Q12H     ASSESSMENT AND PLAN:  1. CAD/NSTEMI:He is known to have CAD with complex PCI of the heavily calcified RCA in December 2015 with rotablator atherectomy and placement of a DES in the mid RCA. He has moderate residual disease in the distal RCA post balloon angioplasty and moderately severe disease in the moderate caliber Diagonal branch. He has had no chest pain at home over the last year. His troponin was elevated in setting of profound anemia and metabolic derangements (hyperkalemia) leading to respiratory failure requiring intubation at time of admission. He did have left  arm pain on the day of admission. Echo 12/23/15 with normal LV function. Nuclear stress test 12/29/15 with possible anterior wall ischemia.  -Plan for cath today. He has had HD the last two days and his volume status is better. Pre-treatment for dye allergy this am with solumedrol x 1, along with IV Benadryl and IV Pepcid. He has upper airway stridor post extubation. Lungs are clear. He can lay flat in the bed without change in respiratory status.   2. ESRD:HD 12/31/15  3. Anemia:suspected due to friable gastric mucosa on EGD 12/25/15. No active bleeding. He has been transfused and H/H is stable.      Lauree Chandler  9/8/20177:15 AM

## 2016-01-01 NOTE — Interval H&P Note (Signed)
Cath Lab Visit (complete for each Cath Lab visit)  Clinical Evaluation Leading to the Procedure:   ACS: No.  Non-ACS:    Anginal Classification: CCS III  Anti-ischemic medical therapy: No Therapy  Non-Invasive Test Results: Intermediate-risk stress test findings: cardiac mortality 1-3%/year  Prior CABG: No previous CABG      History and Physical Interval Note:  01/01/2016 10:56 AM  Dale [name]  has presented today for surgery, with the diagnosis of NSTEMI  The various methods of treatment have been discussed with the patient and family. After consideration of risks, benefits and other options for treatment, the patient has consented to  Procedure(s): Left Heart Cath and Coronary Angiography (N/A) as a surgical intervention .  [name] history has been reviewed, patient examined, no change in status, stable for surgery.  I have reviewed the patient's chart and labs.  Questions were answered to the patient's satisfaction.     Shelva Majestic

## 2016-01-01 NOTE — Progress Notes (Addendum)
NCM gave patient 30 day savings card and co pay amout of 8.25, called [facility] in [place] 336  [phone] pharmacist stated at the moment they do have brilinta in stock.       S/W NHEL P. @ [employer] [facility] # [phone]   BRILINITA 90 MG BID (30 ) 60 TAB  COVER- YES  CO-PAY-$ 8.25  TIER- 3 DRUG  PRIOR APPROVAL - NO  PHARMACY : [facility]

## 2016-01-01 NOTE — Progress Notes (Signed)
Called to room by patient stating that his nose is bleeding, its causing him to cough draining down his throat.  Applied pressure above nose patient inserted tissue in nose.   Will continue to monitor

## 2016-01-01 NOTE — Progress Notes (Signed)
Site area: left groin  Site Prior to Removal:  Level 0  Pressure Applied For 20 MINUTES    Minutes Beginning at 1600  Manual:   Yes.    Patient Status During Pull:  Stable, no complaints   Post Pull Groin Site:  Level 0  Post Pull Instructions Given:  Yes.    Post Pull Pulses Present:  Yes.    Dressing Applied:  Yes.    Comments:  Difficult getting control of site at first,  Estimated blood loss 200cc, patient remand stable during sheath pull, will continue to monitor.  Blood blister medial to incision site, assisted by Clair Gulling, RN

## 2016-01-01 NOTE — Progress Notes (Signed)
Pt in cath holding pre cath. Awake, oriented and pain free. 2LNC with o2 sat 100%. NSR at 97 without ectopy. BP 158/70.

## 2016-01-01 NOTE — Progress Notes (Signed)
Patient called out for a Cepacol lozenger and RN noticed patient was wheezing without stethoscope. Patient states he does feel a little SOB while at rest. RN called RT whio came and evaluated and got a new order for breathing treatments more often. Patient is scheduled to get Solu-medrol at 0600 for pre cath orders d/t an allergy to the contrast dye. Will continue to monitor and follow plan of care.

## 2016-01-01 NOTE — Consult Note (Signed)
Reason for Consult: Nosebleed Referring Physician: Florencia Reasons, MD  Dale [name] is an 50 y.o. male.  HPI: Underwent his second cardiac catheterization with stent placement earlier today. He has had minor nosebleeds in the past but has had a pretty significant one today after the procedure. It was initially from the right side. Currently is not bleeding. He uses Neo-Synephrine a couple of times each week at night to help him breathe better. He has been under the care of Dr. Ilda Foil in [facility] in the past.  Past Medical History:  Diagnosis Date  . Anemia of chronic disease   . Arthritis    "hands, right knee" (03/19/2014)  . CAD (coronary artery disease)    a. Lexiscan Myoview (11/15):  Inf, apical cap and apical lateral ischemia, EF 56%, inf HK, Moderate Risk;  b. LHC (11/15):  mid to dist Dx 80%, mid RCA 99% (functional CTO), dist RCA 80% >> PCI: balloon angioplasty to mid RCA (could not deliver stent) - plan staged PCI with HSRA of RCA; Dx to be tx medically >> PCI: Rotoblator atherectomy/DES to [facility]  . Cholecystitis    a. 08/27/2011  . Chronic diastolic CHF (congestive heart failure) ([place])    a. Echo 3/13:  EF 55-60%;  b. Echo (11/15):  Mild LVH, EF 60-65%, no RWMA, Gr 1 DD, MAC, mild LAE, normal RVF, mild RAE;  c.  Echo 5/16:  severe LVH, EF 55-60%, no RWMA, Gr 1 DD, MAC, mild LAE  . Claustrophobia    when things get around his face.   . Difficult intubation    only once in 2015 at [facility] haven't had a problem since then  . ESRD (end stage renal disease) ([place])    a. 1995 s/p cadaveric transplant w/ susbequent failure after 18 yrs;  b.Dialysis initiated 07/2011 [facility] M-W[facility]F  . Fibromyalgia   . GERD (gastroesophageal reflux disease)   . Gout    PMH  . History of blood transfusion   . HLD (hyperlipidemia)   . Hypertension   . Hypothyroidism   . Inguinal lymphadenopathy    a. bilateral - s/p biopsy 07/2011  . Insulin dependent diabetes mellitus (HCC)    Type I  . OSA  (obstructive sleep apnea)    adjustable bed  . Pericardial effusion    a.  Small by CT 08/22/11;  b.  Large by CT 08/27/11  . Pneumonia ~ 2007    Past Surgical History:  Procedure Laterality Date  . AMPUTATION OF REPLICATED TOES Right   . ANGIOPLASTY  04/09/2014   RCA   . APPENDECTOMY  ~ 2006  . ARTERIOVENOUS GRAFT PLACEMENT Left 1993?   forearm  . ARTERIOVENOUS GRAFT PLACEMENT Right 1993?   leg  . ARTERIOVENOUS GRAFT PLACEMENT Left    Thigh  . Arteriovenous Graft Removed Left    Thigh  . AV FISTULA PLACEMENT  07/29/2011   Procedure: ARTERIOVENOUS (AV) FISTULA CREATION;  Surgeon: Mal Misty, MD;  Location: [place];  Service: Vascular;  Laterality: Right;  Brachial cephalic  . AV FISTULA PLACEMENT Left 01/20/2015   Procedure: LIGATION OF RIGHT ARTERIOVENOUS FISTULA WITH EXCISION OF ANEURYSM;  Surgeon: Angelia Mould, MD;  Location: [place];  Service: Vascular;  Laterality: Left;  . AV FISTULA PLACEMENT Left 04/30/2015   Procedure: CREATION OF LEFT ARM BRACHIO-CEPHALIC ARTERIOVENOUS (AV) FISTULA ;  Surgeon: Angelia Mould, MD;  Location: [place];  Service: Vascular;  Laterality: Left;  . AV Graft PLACEMENT Left  1993?   "attempted one in my wrist; didn't take"  . BELOW KNEE LEG AMPUTATION Left 2010  . CARDIAC CATHETERIZATION  03/19/2014  . CARDIAC CATHETERIZATION  03/19/2014   Procedure: CORONARY BALLOON ANGIOPLASTY;  Surgeon: Burnell Blanks, MD;  Location: [facility] CATH LAB;  Service: Cardiovascular;;  . CARPAL TUNNEL RELEASE Bilateral after 2005  . CATARACT EXTRACTION W/ INTRAOCULAR LENS  IMPLANT, BILATERAL Bilateral 1990's  . COLONOSCOPY  08/29/2011   Procedure: COLONOSCOPY;  Surgeon: Jerene Bears, MD;  Location: [place];  Service: Gastroenterology;  Laterality: N/A;  . CORONARY ANGIOPLASTY WITH STENT PLACEMENT  04/09/2014       ptca/des mid lad   . ESOPHAGOGASTRODUODENOSCOPY N/A 12/25/2015   Procedure: ESOPHAGOGASTRODUODENOSCOPY (EGD);  Surgeon: Mauri Pole, MD;   Location[facility] ENDOSCOPY;  Service: Endoscopy;  Laterality: N/A;  bedside  . EYE SURGERY Bilateral    cataract  . FEMORAL ARTERY REPAIR  04/09/2014   ANGIOSEAL  . INSERTION OF DIALYSIS CATHETER  08/01/2011   Procedure: INSERTION OF DIALYSIS CATHETER;  Surgeon: Rosetta Posner, MD;  Location: [place];  Service: Vascular;  Laterality: Right;  insertion of dialysis catheter on right internal jugular vein  . INSERTION OF DIALYSIS CATHETER Right 01/20/2015   Procedure: INSERTION OF DIALYSIS CATHETER - RIGHT INTERNAL JUGULAR ;  Surgeon: Angelia Mould, MD;  Location: [place];  Service: Vascular;  Laterality: Right;  . KIDNEY TRANSPLANT  04/25/1993  . LEFT HEART CATHETERIZATION WITH CORONARY ANGIOGRAM N/A 03/19/2014   Procedure: LEFT HEART CATHETERIZATION WITH CORONARY ANGIOGRAM;  Surgeon: Burnell Blanks, MD;  Location: [facility] CATH LAB;  Service: Cardiovascular;  Laterality: N/A;  . LYMPH NODE BIOPSY  08/10/2011   Procedure: LYMPH NODE BIOPSY;  Surgeon: Merrie Roof, MD;  Location: [place];  Service: General;  Laterality: Left;  left groin lymph biopsy  . NEUROPLASTY / TRANSPOSITION ULNAR NERVE AT ELBOW Left after 2005  . PERCUTANEOUS CORONARY ROTOBLATOR INTERVENTION (PCI-R) N/A 04/09/2014   Procedure: PERCUTANEOUS CORONARY ROTOBLATOR INTERVENTION (PCI-R);  Surgeon: Burnell Blanks, MD;  Location: [facility] CATH LAB;  Service: Cardiovascular;  Laterality: N/A;  . PERIPHERAL VASCULAR CATHETERIZATION N/A 12/25/2014   Procedure: Upper Extremity Venography;  Surgeon: Conrad Nevada, MD;  Location: [place] CV LAB;  Service: Cardiovascular;  Laterality: N/A;  . PERITONEAL CATHETER INSERTION    . PERITONEAL CATHETER REMOVAL    . REVISON OF ARTERIOVENOUS FISTULA Right 7/42/5956   Procedure: PLICATION / REVISION OF ARTERIOVENOUS FISTULA;  Surgeon: Angelia Mould, MD;  Location: [place];  Service: Vascular;  Laterality: Right;  . REVISON OF ARTERIOVENOUS FISTULA Left 09/01/2015   Procedure:  SUPERFICIALIZATION OF LEFT ARM BRACHIOCEPHALIC ARTERIOVENOUS FISTULA;  Surgeon: Angelia Mould, MD;  Location: [place];  Service: Vascular;  Laterality: Left;  . SHOULDER ARTHROSCOPY Left ~ 2006   frozen  . TOE AMPUTATION Bilateral after 2005  . UMBILICAL HERNIA REPAIR  1990's  . VENOGRAM Right 07/28/2011   Procedure: VENOGRAM;  Surgeon: Conrad , MD;  Location: [facility] CATH LAB;  Service: Cardiovascular;  Laterality: Right;  . VITRECTOMY Bilateral     Family History  Problem Relation Age of Onset  . Colon polyps Father   . Diabetes Father   . Hypertension Father   . Hypertension Mother   . Diabetes Mother   . Hyperlipidemia Mother   . Breast cancer Maternal Aunt   . Cancer Maternal Aunt     Breast and Bone  . Malignant hyperthermia Neg Hx   .  Heart attack Neg Hx   . Stroke Neg Hx     Social History:  reports that he has never smoked. He has never used smokeless tobacco. He reports that he drinks alcohol. He reports that he does not use drugs.  Allergies:  Allergies  Allergen Reactions  . Contrast Media [Iodinated Diagnostic Agents] Rash and Itching    Systemic itching and transient rash appearance within hours of receiving contrast  Systemic itching and transient rash appearance within hours of receiving contrast  Redman Syndrome  . Iodides Rash  . Ioxaglate Rash  . Rocephin [Ceftriaxone] Itching  . Amoxicillin Itching and Rash    .Has patient had a PCN reaction causing immediate rash, facial/tongue/throat swelling, SOB or lightheadedness with hypotension: Yes Has patient had a PCN reaction causing severe rash involving mucus membranes or skin necrosis: No Has patient had a PCN reaction that required hospitalization No Has patient had a PCN reaction occurring within the last 10 years: No If all of the above answers are "NO", then may proceed with Cephalosporin use. Has patient had a PCN reaction causing immediate rash, facial/tongue/throat swelling, SOB or  lightheadedness with hypotension: Yes Has patient had a PCN reaction causing severe rash involving mucus membranes or skin necrosis: No Has patient had a PCN reaction that required hospitalization No Has patient had a PCN reaction occurring within the last 10 years: No If all of the above answers are "NO", then may proceed with Cephalosporin use.  . Buprenorphine Hcl Nausea Only and Hives  . Clopidogrel Rash  . Enalapril Rash    cough  . Hydrocodone Rash  . Morphine Nausea Only, Rash and Hives  . Morphine And Related Hives and Nausea Only  . Adhesive [Tape] Other (See Comments) and Itching    Blisters on arm from adhesive tape at dialysis Blisters on arm from adhesive tape at dialysis  . Ciprofloxacin Itching and Rash  . Clindamycin/Lincomycin Hives and Itching  . Codeine Hives, Itching and Rash  . Doxycycline Rash  . Enalaprilat     Other reaction(s): Cough (ALLERGY/intolerance)  . Levofloxacin Hives and Itching  . Lincomycin Hcl Itching and Hives  . Mobic [Meloxicam] Hives and Rash  . Oxycodone Rash  . Plavix [Clopidogrel Bisulfate] Rash  . Vasotec Cough    Medications: Reviewed  Results for orders placed or performed during the hospital encounter of 12/21/15 (from the past 48 hour(s))  Glucose, capillary     Status: Abnormal   Collection Time: 12/30/15  8:59 PM  Result Value Ref Range   Glucose-Capillary 223 (H) 65 - 99 mg/dL  Renal function panel (daily at 0500)     Status: Abnormal   Collection Time: 12/31/15  2:48 AM  Result Value Ref Range   Sodium 130 (L) 135 - 145 mmol/L   Potassium 3.7 3.5 - 5.1 mmol/L   Chloride 93 (L) 101 - 111 mmol/L   CO2 25 22 - 32 mmol/L   Glucose, Bld 261 (H) 65 - 99 mg/dL   BUN 38 (H) 6 - 20 mg/dL   Creatinine, Ser 5.66 (H) 0.61 - 1.24 mg/dL   Calcium 8.0 (L) 8.9 - 10.3 mg/dL   Phosphorus 6.3 (H) 2.5 - 4.6 mg/dL   Albumin 2.7 (L) 3.5 - 5.0 g/dL   GFR calc non Af Amer 11 (L) >60 mL/min   GFR calc Af Amer 12 (L) >60 mL/min     Comment: (NOTE) The eGFR has been calculated using the CKD EPI equation. This calculation has not  been validated in all clinical situations. eGFR's persistently <60 mL/min signify possible Chronic Kidney Disease.    Anion gap 12 5 - 15  Glucose, capillary     Status: Abnormal   Collection Time: 12/31/15  6:06 AM  Result Value Ref Range   Glucose-Capillary 211 (H) 65 - 99 mg/dL  Glucose, capillary     Status: Abnormal   Collection Time: 12/31/15  7:27 AM  Result Value Ref Range   Glucose-Capillary 230 (H) 65 - 99 mg/dL  Glucose, capillary     Status: Abnormal   Collection Time: 12/31/15 11:07 AM  Result Value Ref Range   Glucose-Capillary 256 (H) 65 - 99 mg/dL   Comment 1 Notify RN    Comment 2 Document in Chart   CBC     Status: Abnormal   Collection Time: 12/31/15  2:00 PM  Result Value Ref Range   WBC 6.4 4.0 - 10.5 K/uL   RBC 3.04 (L) 4.22 - 5.81 MIL/uL   Hemoglobin 9.1 (L) 13.0 - 17.0 g/dL   HCT 28.0 (L) 39.0 - 52.0 %   MCV 92.1 78.0 - 100.0 fL   MCH 29.9 26.0 - 34.0 pg   MCHC 32.5 30.0 - 36.0 g/dL   RDW 17.0 (H) 11.5 - 15.5 %   Platelets 238 150 - 400 K/uL  Glucose, capillary     Status: None   Collection Time: 12/31/15  5:25 PM  Result Value Ref Range   Glucose-Capillary 82 65 - 99 mg/dL  Renal function panel (daily at 1600)     Status: Abnormal   Collection Time: 12/31/15  6:33 PM  Result Value Ref Range   Sodium 131 (L) 135 - 145 mmol/L   Potassium 3.9 3.5 - 5.1 mmol/L   Chloride 97 (L) 101 - 111 mmol/L   CO2 26 22 - 32 mmol/L   Glucose, Bld 110 (H) 65 - 99 mg/dL   BUN 23 (H) 6 - 20 mg/dL   Creatinine, Ser 3.92 (H) 0.61 - 1.24 mg/dL   Calcium 8.1 (L) 8.9 - 10.3 mg/dL   Phosphorus 3.8 2.5 - 4.6 mg/dL   Albumin 2.7 (L) 3.5 - 5.0 g/dL   GFR calc non Af Amer 16 (L) >60 mL/min   GFR calc Af Amer 19 (L) >60 mL/min    Comment: (NOTE) The eGFR has been calculated using the CKD EPI equation. This calculation has not been validated in all clinical  situations. eGFR's persistently <60 mL/min signify possible Chronic Kidney Disease.    Anion gap 8 5 - 15  Glucose, capillary     Status: Abnormal   Collection Time: 12/31/15  9:01 PM  Result Value Ref Range   Glucose-Capillary 153 (H) 65 - 99 mg/dL  Renal function panel (daily at 0500)     Status: Abnormal   Collection Time: 01/01/16  5:14 AM  Result Value Ref Range   Sodium 134 (L) 135 - 145 mmol/L   Potassium 3.7 3.5 - 5.1 mmol/L    Comment: SLIGHT HEMOLYSIS   Chloride 98 (L) 101 - 111 mmol/L   CO2 26 22 - 32 mmol/L   Glucose, Bld 135 (H) 65 - 99 mg/dL   BUN 32 (H) 6 - 20 mg/dL   Creatinine, Ser 5.32 (H) 0.61 - 1.24 mg/dL   Calcium 8.4 (L) 8.9 - 10.3 mg/dL   Phosphorus 4.5 2.5 - 4.6 mg/dL   Albumin 2.7 (L) 3.5 - 5.0 g/dL   GFR calc non Af Amer 11 (L) >60  mL/min   GFR calc Af Amer 13 (L) >60 mL/min    Comment: (NOTE) The eGFR has been calculated using the CKD EPI equation. This calculation has not been validated in all clinical situations. eGFR's persistently <60 mL/min signify possible Chronic Kidney Disease.    Anion gap 10 5 - 15  Magnesium     Status: None   Collection Time: 01/01/16  5:14 AM  Result Value Ref Range   Magnesium 2.0 1.7 - 2.4 mg/dL  CBC     Status: Abnormal   Collection Time: 01/01/16  5:14 AM  Result Value Ref Range   WBC 8.3 4.0 - 10.5 K/uL    Comment: WHITE COUNT CONFIRMED ON SMEAR REPEATED TO VERIFY    RBC 3.18 (L) 4.22 - 5.81 MIL/uL   Hemoglobin 9.5 (L) 13.0 - 17.0 g/dL   HCT 29.5 (L) 39.0 - 52.0 %   MCV 92.8 78.0 - 100.0 fL   MCH 29.9 26.0 - 34.0 pg   MCHC 32.2 30.0 - 36.0 g/dL   RDW 16.8 (H) 11.5 - 15.5 %   Platelets 263 150 - 400 K/uL  Glucose, capillary     Status: Abnormal   Collection Time: 01/01/16  7:33 AM  Result Value Ref Range   Glucose-Capillary 109 (H) 65 - 99 mg/dL   Comment 1 Notify RN    Comment 2 Document in Chart   Glucose, capillary     Status: Abnormal   Collection Time: 01/01/16  9:23 AM  Result Value Ref Range    Glucose-Capillary 149 (H) 65 - 99 mg/dL  POCT Activated clotting time     Status: None   Collection Time: 01/01/16 11:35 AM  Result Value Ref Range   Activated Clotting Time 312 seconds  Glucose, capillary     Status: Abnormal   Collection Time: 01/01/16  2:12 PM  Result Value Ref Range   Glucose-Capillary 269 (H) 65 - 99 mg/dL  POCT Activated clotting time     Status: None   Collection Time: 01/01/16  3:11 PM  Result Value Ref Range   Activated Clotting Time 180 seconds  Renal function panel (daily at 1600)     Status: Abnormal   Collection Time: 01/01/16  3:38 PM  Result Value Ref Range   Sodium 128 (L) 135 - 145 mmol/L   Potassium 5.5 (H) 3.5 - 5.1 mmol/L   Chloride 95 (L) 101 - 111 mmol/L   CO2 19 (L) 22 - 32 mmol/L   Glucose, Bld 305 (H) 65 - 99 mg/dL   BUN 44 (H) 6 - 20 mg/dL   Creatinine, Ser 6.31 (H) 0.61 - 1.24 mg/dL   Calcium 8.3 (L) 8.9 - 10.3 mg/dL   Phosphorus 6.3 (H) 2.5 - 4.6 mg/dL   Albumin 2.6 (L) 3.5 - 5.0 g/dL   GFR calc non Af Amer 9 (L) >60 mL/min   GFR calc Af Amer 11 (L) >60 mL/min    Comment: (NOTE) The eGFR has been calculated using the CKD EPI equation. This calculation has not been validated in all clinical situations. eGFR's persistently <60 mL/min signify possible Chronic Kidney Disease.    Anion gap 14 5 - 15  POCT Activated clotting time     Status: None   Collection Time: 01/01/16  3:43 PM  Result Value Ref Range   Activated Clotting Time 175 seconds  Glucose, capillary     Status: Abnormal   Collection Time: 01/01/16  5:06 PM  Result Value Ref Range  Glucose-Capillary 286 (H) 65 - 99 mg/dL    No results found.  BOM:QTTCNGFR except as listed in admit H&P  Blood pressure (!) 131/35, pulse 83, temperature 97.7 F (36.5 C), temperature source Oral, resp. rate 20, height _0  (1.727 m), weight 127.4 kg (280 lb 12.8 oz), SpO2 100 %.  PHYSICAL EXAM: Overall appearance:  Obese gentleman, in mild dyspnea. Head:  Normocephalic,  atraumatic. Ears: External ears appear healthy. Nose: External nose is healthy in appearance. Nasal cavities were suctioned and cleaned of dried blood. Topical Xylocaine/Afrin was applied and spray form and on pledgets. Active bleeding site was identified in the right anterior septum. This was cauterized with silver nitrate. There is no further bleeding. No other abnormalities were identified. Oral Cavity/Pharynx:  There are no mucosal lesions or masses identified. Larynx/Hypopharynx: Deferred Neuro:  No identifiable neurologic deficits. Neck: No palpable neck masses.  Studies Reviewed: none  Procedures: treatment right anterior epistaxis.  Topical Xylocaine/Afrin solution was sprayed and applied on pledgets to the both nasal cavities. After cleaning out all the dried blood and debris and active bleeding site was identified in the right anterior septum. This was cauterized with a single silver nitrate stick. There is no further bleeding. No other abnormalities were identified. He tolerated this well.   Assessment/Plan: Right anterior septal epistaxis, following heart catheterization, currently on antiplatelet medication. Recommend avoid nasal oxygen. Recommend nasal saline spray frequently to keep the nose from drying out. I will keep all of my equipment in the room in case there is further bleeding. Contact me if there are any additional problems.  Dale [name] 01/01/2016, 7:11 PM

## 2016-01-01 NOTE — H&P (View-Only) (Signed)
SUBJECTIVE: No chest pain  Tele: sinus  BP (!) 137/58 (BP Location: Right Arm)   Pulse 94   Temp 97.7 F (36.5 C) (Oral)   Resp (!) 22   Ht 5\' 8"  (1.727 m)   Wt 280 lb 12.8 oz (127.4 kg)   SpO2 98%   BMI 42.70 kg/m   Intake/Output Summary (Last 24 hours) at 01/01/16 0715 Last data filed at 12/31/15 2100  Gross per 24 hour  Intake          1168.83 ml  Output             3500 ml  Net         -2331.17 ml    PHYSICAL EXAM General: Obese WM. NAD. Still has stridor type upper airway sounds. Alert but drowsy post Benadryl  Psych:  Good affect, responds appropriately Neck: Cannot assess for JVD due to size of neck. No masses noted.  Lungs: Clear bilaterally with no wheezes or rhonci noted. He has upper airway stridor that is unchanged.  Heart: RRR with no murmurs noted. Abdomen: Bowel sounds are present. Soft, non-tender.  Extremities: No right lower extremity edema. Left LE amputation  LABS: Basic Metabolic Panel:  Recent Labs  12/30/15 0413  12/31/15 1833 01/01/16 0514  NA  --   < > 131* 134*  K  --   < > 3.9 3.7  CL  --   < > 97* 98*  CO2  --   < > 26 26  GLUCOSE  --   < > 110* 135*  BUN  --   < > 23* 32*  CREATININE  --   < > 3.92* 5.32*  CALCIUM  --   < > 8.1* 8.4*  MG 2.2  --   --  2.0  PHOS  --   < > 3.8 4.5  < > = values in this interval not displayed. CBC:  Recent Labs  12/31/15 1400 01/01/16 0514  WBC 6.4 PENDING  HGB 9.1* 9.5*  HCT 28.0* 29.5*  MCV 92.1 92.8  PLT 238 263   Current Meds: . aspirin EC  81 mg Oral QHS  . atorvastatin  80 mg Oral QHS  . feeding supplement (NEPRO CARB STEADY)  237 mL Oral Q24H  . fluticasone  1 spray Each Nare Daily  . gabapentin  200 mg Oral TID  . insulin aspart  0-20 Units Subcutaneous TID WC  . insulin aspart  6 Units Subcutaneous TID WC  . insulin glargine  22 Units Subcutaneous BID  . ipratropium-albuterol  3 mL Nebulization BID  . levothyroxine  100 mcg Oral QAC breakfast  . loratadine  5 mg Oral  BID  . pantoprazole  40 mg Oral BID AC  . predniSONE  10 mg Oral Q breakfast  . rOPINIRole  2 mg Oral Q supper  . sodium chloride flush  3 mL Intravenous Q12H     ASSESSMENT AND PLAN:  1. CAD/NSTEMI:He is known to have CAD with complex PCI of the heavily calcified RCA in December 2015 with rotablator atherectomy and placement of a DES in the mid RCA. He has moderate residual disease in the distal RCA post balloon angioplasty and moderately severe disease in the moderate caliber Diagonal branch. He has had no chest pain at home over the last year. His troponin was elevated in setting of profound anemia and metabolic derangements (hyperkalemia) leading to respiratory failure requiring intubation at time of admission. He did have left  arm pain on the day of admission. Echo 12/23/15 with normal LV function. Nuclear stress test 12/29/15 with possible anterior wall ischemia.  -Plan for cath today. He has had HD the last two days and his volume status is better. Pre-treatment for dye allergy this am with solumedrol x 1, along with IV Benadryl and IV Pepcid. He has upper airway stridor post extubation. Lungs are clear. He can lay flat in the bed without change in respiratory status.   2. ESRD:HD 12/31/15  3. Anemia:suspected due to friable gastric mucosa on EGD 12/25/15. No active bleeding. He has been transfused and H/H is stable.      Dale [name]  9/8/20177:15 AM

## 2016-01-01 NOTE — Progress Notes (Signed)
[facility] Progress Note   Subjective: "My nose is bleeding..I hope you are going to take me to dialysis so I can get rid of this heparin!" Wife at bedside. Denies chest pain/SOB. Tissue packed in R nare. Wife concerned that they have yet to find source of GI  bleed. Both seem happy with cardiac cath results.   Objective Vitals:   01/01/16 1315 01/01/16 1330 01/01/16 1345 01/01/16 1600  BP: (!) 194/62 (!) 182/58 (!) 189/55 (!) 131/35  Pulse: 100 100 99 83  Resp: (!) 21 15 (!) 22 20  Temp:    97.7 F (36.5 C)  TempSrc:    Oral  SpO2: 100% 100% 100% 100%  Weight:      Height:       Physical Exam General: Pleasant, slightly anxious about nose bleed but NAD Heart: RRR Lungs: No WOB Abdomen: obese, nontender Extremities: L BKA, no edema Dialysis Access: LUA AVF + bruit  Dialysis Orders: MWF AF    4h 68min  2/2.25 bath   127kg   Hep 7000  P4     IJ cath/ LUA AVF (have started using AVF at OP center approx 2 wks now, just got down to 15 ga w 2 needles)  No ESA, weekly Fe at center       Additional Objective Labs: Basic Metabolic Panel:  Recent Labs Lab 12/31/15 1833 01/01/16 0514 01/01/16 1538  NA 131* 134* 128*  K 3.9 3.7 5.5*  CL 97* 98* 95*  CO2 26 26 19*  GLUCOSE 110* 135* 305*  BUN 23* 32* 44*  CREATININE 3.92* 5.32* 6.31*  CALCIUM 8.1* 8.4* 8.3*  PHOS 3.8 4.5 6.3*   Liver Function Tests:  Recent Labs Lab 12/31/15 1833 01/01/16 0514 01/01/16 1538  ALBUMIN 2.7* 2.7* 2.6*   No results for input(s): LIPASE, AMYLASE in the last 168 hours. CBC:  Recent Labs Lab 12/28/15 0809  12/29/15 0502 12/30/15 0413 12/31/15 1400 01/01/16 0514  WBC 5.9  --  9.1 9.7 6.4 8.3  HGB 7.1*  < > 9.0* 9.2* 9.1* 9.5*  HCT 20.8*  < > 27.1* 27.4* 28.0* 29.5*  MCV 92.0  --  90.3 90.1 92.1 92.8  PLT 240  --  244 267 238 263  < > = values in this interval not displayed. Blood Culture    Component Value Date/Time   SDES BLOOD LEFT HAND 07/04/2012 2014   SPECREQUEST BOTTLES DRAWN AEROBIC ONLY 4CC 07/04/2012 2014   CULT NO GROWTH 5 DAYS 07/04/2012 2014   REPTSTATUS 07/11/2012 FINAL 07/04/2012 2014    CBG:  Recent Labs Lab 12/31/15 2101 01/01/16 0733 01/01/16 0923 01/01/16 1412 01/01/16 1706  GLUCAP 153* 109* 149* 269* 286*   Iron Studies: No results for input(s): IRON, TIBC, TRANSFERRIN, FERRITIN in the last 72 hours. @lablastinr3 @ Studies/Results: No results found. Medications:      Assessment/Plan:  1. Acute GI Bleed- friable gastric mucosa on EGD w remnants from bleeding. No identifiable source of bleeding found. S/P prbc's (6-8 units total so far). PPI. HGB 9.5 today. Follow HGB .  2. CP/ NSTEMI: peak trop 18, abnormal stress, Had cardiac cath today per Dr. Dwyane Dee DES placed 1st diag-95% stenosed-0% residual post procedure. L groin stable post cath.  3. Volume overload - Improved. HD 12/30/15 Pre wt 132.5 kg net UF 3500 post wt 129 kg. Wt today 127.4. Will have HD 1st shift in AM.  4. ESRD- MWF HD, has cath and using AVF at center, 15ga w 2 needles.  K+ post cath 5.5. HD in AM 3-3.5 liters as tolerated.  5. ChronicHypotension-on midodrine pre HD 6. Anemia - not on ESA at center, started ESA now darbe 80 mg/ wk; tsat 34% 7. S/p VDRF 8. DM2 - on insulin  9. Resp failure/ VDRF - resolved 10. Nose Bleed: ENT consult called per primary.   Rita H. Brown NP-C 01/01/2016, 6:45 PM  [facility] [phone]  Patient seen and examined, agree with above note with above modifications. S/p cardiac cath with stent- complication of nose bleed.  Planning on HD first shift tomorrow- no heparin- transfuse if needed  Corliss Parish, MD 01/01/2016

## 2016-01-02 LAB — HEPATIC FUNCTION PANEL
ALT: 23 U/L (ref 17–63)
AST: 19 U/L (ref 15–41)
Albumin: 2.5 g/dL — ABNORMAL LOW (ref 3.5–5.0)
Alkaline Phosphatase: 54 U/L (ref 38–126)
Bilirubin, Direct: <0.1 mg/dL — ABNORMAL LOW (ref 0.1–0.5)
Total Bilirubin: 0.5 mg/dL (ref 0.3–1.2)
Total Protein: 5.6 g/dL — ABNORMAL LOW (ref 6.5–8.1)

## 2016-01-02 LAB — RENAL FUNCTION PANEL
Albumin: 2.4 g/dL — ABNORMAL LOW (ref 3.5–5.0)
Albumin: 2.7 g/dL — ABNORMAL LOW (ref 3.5–5.0)
Albumin: 2.7 g/dL — ABNORMAL LOW (ref 3.5–5.0)
Anion gap: 11 (ref 5–15)
Anion gap: 14 (ref 5–15)
Anion gap: 14 (ref 5–15)
BUN: 23 mg/dL — ABNORMAL HIGH (ref 6–20)
BUN: 54 mg/dL — ABNORMAL HIGH (ref 6–20)
BUN: 62 mg/dL — ABNORMAL HIGH (ref 6–20)
CO2: 19 mmol/L — ABNORMAL LOW (ref 22–32)
CO2: 21 mmol/L — ABNORMAL LOW (ref 22–32)
CO2: 25 mmol/L (ref 22–32)
Calcium: 7.8 mg/dL — ABNORMAL LOW (ref 8.9–10.3)
Calcium: 8.5 mg/dL — ABNORMAL LOW (ref 8.9–10.3)
Calcium: 8.6 mg/dL — ABNORMAL LOW (ref 8.9–10.3)
Chloride: 95 mmol/L — ABNORMAL LOW (ref 101–111)
Chloride: 96 mmol/L — ABNORMAL LOW (ref 101–111)
Chloride: 98 mmol/L — ABNORMAL LOW (ref 101–111)
Creatinine, Ser: 4.18 mg/dL — ABNORMAL HIGH (ref 0.61–1.24)
Creatinine, Ser: 6.97 mg/dL — ABNORMAL HIGH (ref 0.61–1.24)
Creatinine, Ser: 7.77 mg/dL — ABNORMAL HIGH (ref 0.61–1.24)
GFR calc Af Amer: 10 mL/min — ABNORMAL LOW (ref >60)
GFR calc Af Amer: 18 mL/min — ABNORMAL LOW (ref >60)
GFR calc Af Amer: 8 mL/min — ABNORMAL LOW (ref >60)
GFR calc non Af Amer: 15 mL/min — ABNORMAL LOW (ref >60)
GFR calc non Af Amer: 7 mL/min — ABNORMAL LOW (ref >60)
GFR calc non Af Amer: 8 mL/min — ABNORMAL LOW (ref >60)
Glucose, Bld: 148 mg/dL — ABNORMAL HIGH (ref 65–99)
Glucose, Bld: 171 mg/dL — ABNORMAL HIGH (ref 65–99)
Glucose, Bld: 301 mg/dL — ABNORMAL HIGH (ref 65–99)
Phosphorus: 3.5 mg/dL (ref 2.5–4.6)
Phosphorus: 6 mg/dL — ABNORMAL HIGH (ref 2.5–4.6)
Phosphorus: 6.9 mg/dL — ABNORMAL HIGH (ref 2.5–4.6)
Potassium: 3.5 mmol/L (ref 3.5–5.1)
Potassium: 4.1 mmol/L (ref 3.5–5.1)
Potassium: 4.8 mmol/L (ref 3.5–5.1)
Sodium: 131 mmol/L — ABNORMAL LOW (ref 135–145)
Sodium: 131 mmol/L — ABNORMAL LOW (ref 135–145)
Sodium: 131 mmol/L — ABNORMAL LOW (ref 135–145)

## 2016-01-02 LAB — GLUCOSE, CAPILLARY
Glucose-Capillary: 213 mg/dL — ABNORMAL HIGH (ref 65–99)
Glucose-Capillary: 152 mg/dL — ABNORMAL HIGH (ref 65–99)
Glucose-Capillary: 308 mg/dL — ABNORMAL HIGH (ref 65–99)
Glucose-Capillary: 46 mg/dL — ABNORMAL LOW (ref 65–99)
Glucose-Capillary: 47 mg/dL — ABNORMAL LOW (ref 65–99)
Glucose-Capillary: 109 mg/dL — ABNORMAL HIGH (ref 65–99)

## 2016-01-02 LAB — CBC
HCT: 26 % — ABNORMAL LOW (ref 39.0–52.0)
HCT: 24.6 % — ABNORMAL LOW (ref 39.0–52.0)
Hemoglobin: 8.3 g/dL — ABNORMAL LOW (ref 13.0–17.0)
Hemoglobin: 8.2 g/dL — ABNORMAL LOW (ref 13.0–17.0)
MCH: 29.5 pg (ref 26.0–34.0)
MCH: 30.5 pg (ref 26.0–34.0)
MCHC: 31.9 g/dL (ref 30.0–36.0)
MCHC: 33.3 g/dL (ref 30.0–36.0)
MCV: 92.5 fL (ref 78.0–100.0)
MCV: 91.4 fL (ref 78.0–100.0)
Platelets: 285 10*3/uL (ref 150–400)
Platelets: 266 10*3/uL (ref 150–400)
RBC: 2.81 MIL/uL — ABNORMAL LOW (ref 4.22–5.81)
RBC: 2.69 MIL/uL — ABNORMAL LOW (ref 4.22–5.81)
RDW: 16.7 % — ABNORMAL HIGH (ref 11.5–15.5)
RDW: 17.1 % — ABNORMAL HIGH (ref 11.5–15.5)
WBC: 11.6 10*3/uL — ABNORMAL HIGH (ref 4.0–10.5)
WBC: 11.2 10*3/uL — ABNORMAL HIGH (ref 4.0–10.5)

## 2016-01-02 LAB — BASIC METABOLIC PANEL
Anion gap: 14 (ref 5–15)
BUN: 61 mg/dL — ABNORMAL HIGH (ref 6–20)
CO2: 21 mmol/L — ABNORMAL LOW (ref 22–32)
Calcium: 8.6 mg/dL — ABNORMAL LOW (ref 8.9–10.3)
Chloride: 96 mmol/L — ABNORMAL LOW (ref 101–111)
Creatinine, Ser: 7.61 mg/dL — ABNORMAL HIGH (ref 0.61–1.24)
GFR calc Af Amer: 9 mL/min — ABNORMAL LOW (ref >60)
GFR calc non Af Amer: 7 mL/min — ABNORMAL LOW (ref >60)
Glucose, Bld: 219 mg/dL — ABNORMAL HIGH (ref 65–99)
Potassium: 4.5 mmol/L (ref 3.5–5.1)
Sodium: 131 mmol/L — ABNORMAL LOW (ref 135–145)

## 2016-01-02 LAB — MAGNESIUM: Magnesium: 2.2 mg/dL (ref 1.7–2.4)

## 2016-01-02 MED ORDER — LIVING BETTER WITH HEART FAILURE BOOK
Freq: Once | Status: AC
  Administered 2016-01-02: 07:00:00

## 2016-01-02 MED ORDER — HEART ATTACK BOUNCING BOOK
Freq: Once | Status: AC
  Administered 2016-01-02: 07:00:00
  Filled 2016-01-02: qty 1

## 2016-01-02 MED ORDER — ALBUMIN HUMAN 25 % IV SOLN
INTRAVENOUS | Status: AC
  Filled 2016-01-02: qty 50

## 2016-01-02 MED ORDER — LIDOCAINE HCL (PF) 1 % IJ SOLN
INTRAMUSCULAR | Status: AC
  Filled 2016-01-02: qty 30

## 2016-01-02 MED ORDER — MIDODRINE HCL 5 MG PO TABS
10.0000 mg | ORAL_TABLET | Freq: Once | ORAL | Status: AC
  Administered 2016-01-02: 10 mg via ORAL

## 2016-01-02 MED ORDER — DEXTROSE 50 % IV SOLN
25.0000 mL | Freq: Once | INTRAVENOUS | Status: AC
  Administered 2016-01-02: 25 mL via INTRAVENOUS

## 2016-01-02 MED ORDER — ALBUMIN HUMAN 25 % IV SOLN
12.5000 g | Freq: Once | INTRAVENOUS | Status: AC
  Administered 2016-01-02: 12.5 g via INTRAVENOUS

## 2016-01-02 MED ORDER — ANGIOPLASTY BOOK
Freq: Once | Status: AC
  Administered 2016-01-02: 07:00:00
  Filled 2016-01-02: qty 1

## 2016-01-02 MED ORDER — GLUCOSE 40 % PO GEL
ORAL | Status: AC
  Administered 2016-01-02: 21:00:00
  Filled 2016-01-02: qty 1

## 2016-01-02 MED ORDER — LIDOCAINE HCL (PF) 2 % IJ SOLN
0.0000 mL | Freq: Once | INTRAMUSCULAR | Status: DC | PRN
  Filled 2016-01-02: qty 20

## 2016-01-02 MED ORDER — MIDODRINE HCL 5 MG PO TABS
ORAL_TABLET | ORAL | Status: AC
  Filled 2016-01-02: qty 2

## 2016-01-02 MED ORDER — GLUCOSE 40 % PO GEL
1.0000 | Freq: Once | ORAL | Status: AC
  Administered 2016-01-02: 37.5 g via ORAL

## 2016-01-02 MED ORDER — DEXTROSE 50 % IV SOLN
INTRAVENOUS | Status: AC
  Administered 2016-01-02: 21:00:00
  Filled 2016-01-02: qty 50

## 2016-01-02 NOTE — Progress Notes (Signed)
Cardiac rehab Pt s/p NSTEMI and stent placement on 9/8.  Unable to see pt earlier on 6C due to hemodialysis.    Pt felt too tired from HD to ambulate with rehab staff in hallway.  Pt with minimal assist up to bsc with use of prosthesis. Pt ambulates within the home, able to bathe self with assistance.  Pt uses motorized wheelchair in grocery store.  Pt  cook meals for the household with assistance. Chart review for possible d/c tomorrow. Pt educated on heart healthy diet with diabetes and kidney modifications. Encourage pt to utilize dietician resources at his outpatient dialysis clinic due to the complex restrictions with food choices.Reviewed exercise guidelines as pt is able to tolerate based upon his activity baseline. Questions answered, MI booklet given along with handouts.  Wife is present at bedside.  Pt agreeable for contact information to be sent to [facility].  Pt given contact information. Pt remains on [facility] and has call bell to call for assistance back to bed.  Due to pt complex medical history would benefit from social worker to assess underutilized resources and identify potential needs for the home.  Primary RN notified of concerns.Cherre Huger, BSN [phone]

## 2016-01-02 NOTE — Progress Notes (Signed)
Hospital Problem List     Principal Problem:   NSTEMI (non-ST elevated myocardial infarction) ([place]) Active Problems:   S/P BKA (below knee amputation) ([place])   Anemia   HTN (hypertension)   Coronary artery disease involving native coronary artery of native heart with unstable angina pectoris ([place])   HLD (hyperlipidemia)   Gastrointestinal hemorrhage with melena   Encounter for intubation   Acute blood loss anemia   Uremia   Acute encephalopathy   Troponin level elevated   Encounter for central line placement   Stridor   Iron deficiency anemia    Patient Profile:   Primary Cardiologist: Dr. [name]  Subjective   Denies any chest discomfort or palpitations overnight. Left groin hematoma resolved. Had episode of epistaxis yesterday afternoon, now resolved as well. For HD this morning.   Inpatient Medications    . aspirin  81 mg Oral Daily  . atorvastatin  80 mg Oral q1800  . feeding supplement (NEPRO CARB STEADY)  237 mL Oral Q24H  . fluticasone  1 spray Each Nare Daily  . gabapentin  200 mg Oral TID  . insulin aspart  0-20 Units Subcutaneous TID WC  . insulin aspart  6 Units Subcutaneous TID WC  . insulin glargine  22 Units Subcutaneous BID  . ipratropium-albuterol  3 mL Nebulization BID  . levothyroxine  100 mcg Oral QAC breakfast  . loratadine  5 mg Oral BID  . metoprolol tartrate  12.5 mg Oral BID  . oxymetazoline  1 spray Each Nare BID  . pantoprazole  40 mg Oral BID AC  . predniSONE  10 mg Oral Q breakfast  . rOPINIRole  2 mg Oral Q supper  . sodium chloride flush  3 mL Intravenous Q12H  . sodium chloride flush  3 mL Intravenous Q12H  . ticagrelor  90 mg Oral BID    Vital Signs    Vitals:   01/01/16 2203 01/01/16 2257 01/02/16 0407 01/02/16 0709  BP: (!) 107/37 (!) 130/59 (!) 134/50 (!) 124/47  Pulse: 90 92 83 84  Resp: (!) 24 (!) 21 (!) 21 (!) 24  Temp:   97.3 F (36.3 C) 98 F (36.7 C)  TempSrc:   Axillary Oral  SpO2: 99% 99% 100% 100%    Weight:   254 lb 3.1 oz (115.3 kg)   Height:        Intake/Output Summary (Last 24 hours) at 01/02/16 0719 Last data filed at 01/01/16 0800  Gross per 24 hour  Intake                0 ml  Output                0 ml  Net                0 ml   Filed Weights   12/31/15 1648 01/01/16 0355 01/02/16 0407  Weight: 284 lb 6.3 oz (129 kg) 280 lb 12.8 oz (127.4 kg) 254 lb 3.1 oz (115.3 kg)    Physical Exam    General: Obese Caucasian male appearing in no acute distress. Head: Normocephalic, atraumatic.  Neck: Supple without bruits, JVD not elevated Lungs:  Resp regular and unlabored, CTA without wheezing or rales. Heart: RRR, S1, S2, no S3, S4, or murmur; no rub. Abdomen: Soft, non-tender, non-distended with normoactive bowel sounds. No hepatomegaly. No rebound/guarding. No obvious abdominal masses. Extremities: No clubbing, cyanosis, or edema. Left AKA. Left groin with large ecchymosis. No hematoma appreciated.  Neuro: Alert and oriented X 3. Moves all extremities spontaneously. Psych: Normal affect.  Labs    CBC  Recent Labs  01/02/16 0050 01/02/16 0513  WBC 11.6* 11.2*  HGB 8.3* 8.2*  HCT 26.0* 24.6*  MCV 92.5 91.4  PLT 285 885   Basic Metabolic Panel  Recent Labs  01/01/16 0514 01/01/16 1538 01/01/16 2238 01/02/16 0513  NA 134* 128*  --  131*  K 3.7 5.5* 4.8 4.5  CL 98* 95*  --  96*  CO2 26 19*  --  21*  GLUCOSE 135* 305*  --  219*  BUN 32* 44*  --  61*  CREATININE 5.32* 6.31*  --  7.61*  CALCIUM 8.4* 8.3*  --  8.6*  MG 2.0  --   --  2.2  PHOS 4.5 6.3*  --   --    Liver Function Tests  Recent Labs  01/01/16 1538 01/02/16 0513  AST  --  19  ALT  --  23  ALKPHOS  --  54  BILITOT  --  0.5  PROT  --  5.6*  ALBUMIN 2.6* 2.5*   No results for input(s): LIPASE, AMYLASE in the last 72 hours. Cardiac Enzymes No results for input(s): CKTOTAL, CKMB, CKMBINDEX, TROPONINI in the last 72 hours. BNP Invalid input(s): POCBNP D-Dimer No results for input(s):  DDIMER in the last 72 hours. Hemoglobin A1C No results for input(s): HGBA1C in the last 72 hours. Fasting Lipid Panel No results for input(s): CHOL, HDL, LDLCALC, TRIG, CHOLHDL, LDLDIRECT in the last 72 hours. Thyroid Function Tests No results for input(s): TSH, T4TOTAL, T3FREE, THYROIDAB in the last 72 hours.  Invalid input(s): FREET3  Telemetry    NSR, HR in 70's - 90's. No atopic events.   ECG    NSR, HR 82 with no acute ST or T-wave changes.    Cardiac Studies and Radiology    Ct Soft Tissue Neck Wo Contrast  Result Date: 12/27/2015 CLINICAL DATA:  50 year old male intubated a few days ago, sensation of throat closing up. Stridor. Initial encounter. EXAM: CT NECK WITHOUT CONTRAST TECHNIQUE: Multidetector CT imaging of the neck was performed following the standard protocol without intravenous contrast. COMPARISON:  Chest CTA 10/02/2014. FINDINGS: Large body habitus. Pharynx and larynx: The vocal cords are in close apposition (series 2, image 85) but this appears similar to the appearance on the 10/02/2014 chest CTA (series 5, image 5 of that exam). The supraglottic larynx and epiglottis are normal. The subglottic trachea does appear diminutive, but again similar to the prior chest CTA. Compare the Highly atelectatic appearance of the trachea on series 2, image 111 today to series 5, image 33 on the prior chest CTA. 7 Pharyngeal contours are within normal limits. Negative parapharyngeal and retropharyngeal spaces. Salivary glands: Negative sublingual space, submandibular glands and parotid glands. Thyroid: Diminutive common normal. Lymph nodes: Negative.  No cervical lymphadenopathy. Vascular: Right IJ approach dual lumen catheter partially visualized. Calcified distal cervical ICA atherosclerosis. Calcified ICA siphons and distal vertebral arteries. Vasculature not otherwise evaluated in the absence of IV contrast. Limited intracranial: Negative. Visualized orbits: Postoperative changes to  both globes. Mastoids and visualized paranasal sinuses: Small mucous retention cyst right sphenoid sinus. Other paranasal sinuses are clear. Partial opacification of both tympanic cavities and mastoid air cells. The ossicles appear to remain intact. The external auditory canals appear fairly normal. Skeleton:  No acute osseous abnormality identified. Upper chest: Stable visible lung apices. Partially visible chronic mediastinal lipomatosis. IMPRESSION: 1. Tracheomalacia. Highly  atelectatic appearance of the trachea today, but not significantly changed from a June Chest CTA (see series 2, image 111 today and series 5, image 33 on the 10/02/2014 chest CTA). 2. Likewise, the glottis appears stable since June. The supraglottic larynx is normal. No laryngeal trauma suspected. 3. Calcified atherosclerosis of the distal ICAs and vertebral arteries. Electronically Signed   By: Genevie Ann M.D.   On: 12/27/2015 18:46   Nm Myocar Multi W/spect W/wall Motion / Ef  Result Date: 12/29/2015  There was no ST segment deviation noted during stress.  No T wave inversion was noted during stress.  Defect 1: There is a medium defect of moderate severity present in the mid anterior, mid anterolateral, apical anterior and apical lateral location.  Findings consistent with apical anterolateral ischemia and prior apical anterolateral myocardial infarction.  This is an intermediate risk study.  The left ventricular ejection fraction is normal (55-65%).  Nuclear stress EF: 64%.    Dg Chest Port 1 View  Result Date: 01/01/2016 CLINICAL DATA:  Cardiac catheterization with stent placement, postprocedural. EXAM: PORTABLE CHEST 1 VIEW COMPARISON:  12/23/2015 FINDINGS: Right IJ dialysis catheter tip:  Right atrium. Prior nasogastric tube is been removed. Prior left IJ line has been removed. The patient is rotated to the right on today's radiograph, reducing diagnostic sensitivity and specificity. Mild to moderate enlargement of the  cardiopericardial silhouette, without edema. No pneumothorax. Linear subsegmental atelectasis or scarring at the left lung base, otherwise the lungs appear clear. IMPRESSION: 1. Mild-to-moderate enlargement of the cardiopericardial silhouette but without current evidence of edema or pulmonary venous hypertension. 2. Right IJ dialysis catheter tip:  Right atrium. Electronically Signed   By: Van Clines M.D.   On: 01/01/2016 19:43   Dg Chest Port 1 View  Result Date: 12/23/2015 CLINICAL DATA:  Respiratory failure. EXAM: PORTABLE CHEST 1 VIEW COMPARISON:  12/22/2015. FINDINGS: Endotracheal tube, NG tube, left eye J line, large caliber right IJ line in stable position. Cardiomegaly with mild pulmonary venous congestion and interstitial prominence suggesting mild congestive heart failure. Small left pleural effusion. Chest is unchanged from prior exam. No pneumothorax . IMPRESSION: 1. Lines and tubes in stable position. 2. Persistent changes of mild congestive heart failure with cardiomegaly, mild pulmonary venous congestion, mild interstitial edema and small left pleural effusion. No interim change from prior exam. Electronically Signed   By: Marcello Moores  Register   On: 12/23/2015 06:58   Dg Chest Port 1 View  Result Date: 12/22/2015 CLINICAL DATA:  Respiratory failure.  Assess endotracheal tube. EXAM: PORTABLE CHEST 1 VIEW COMPARISON:  Chest radiograph 12/21/2015 FINDINGS: Support lines and tubes are in unchanged position. The tip of the endotracheal tube remains at the level of the clavicular heads. Cardiomediastinal contours are unchanged. Persistent pulmonary vascular congestion. Small left pleural effusion. IMPRESSION: 1. Unchanged support lines and tubes. 2. Mild pulmonary edema. Electronically Signed   By: Ulyses Jarred M.D.   On: 12/22/2015 06:41   Dg Chest Port 1 View  Result Date: 12/21/2015 CLINICAL DATA:  Status post central line placement. EXAM: PORTABLE CHEST 1 VIEW COMPARISON:  Single-view  of the chest earlier today. FINDINGS: Endotracheal tube is again seen with the tip at the top of the clavicular heads, well above the carina. Right IJ approach dialysis catheter remains in place. A new left IJ central venous catheter is seen with the tip projecting over the mid superior vena cava. No pneumothorax. There is cardiomegaly without edema. IMPRESSION: New left IJ catheter tip projects in  the mid superior vena cava. Negative for pneumothorax. No other change. Electronically Signed   By: Inge Rise M.D.   On: 12/21/2015 15:07   Dg Chest Port 1 View  Result Date: 12/21/2015 CLINICAL DATA:  Intubated EXAM: PORTABLE CHEST 1 VIEW COMPARISON:  Chest radiograph from earlier today. FINDINGS: Endotracheal tube tip is 3.8 cm above the carina. Right internal jugular central venous catheter terminates at the cavoatrial junction. Enteric tube enters stomach with the tip not seen on this image. Stable cardiomediastinal silhouette with cardiomegaly. No pneumothorax. Stable probable trace bilateral pleural effusions. Mild pulmonary edema, not definitely changed. Patchy left lung base opacity appears increased. IMPRESSION: 1. Well-positioned support structures as described. 2. Stable mild congestive heart failure . 3. Increased patchy left lung base opacity, favor atelectasis, cannot exclude a component of pneumonia or aspiration. Electronically Signed   By: Ilona Sorrel M.D.   On: 12/21/2015 11:26   Dg Chest Port 1 View  Result Date: 12/21/2015 CLINICAL DATA:  LEFT chest and arm pain for 2 days. History of diabetes, renal failure, CHF. EXAM: PORTABLE CHEST 1 VIEW COMPARISON:  Chest radiograph March 09, 2015 FINDINGS: Cardiac silhouette is similarly enlarged. Mediastinal silhouette is nonsuspicious, tunneled dialysis catheter via RIGHT internal jugular venous approach with distal tips projecting cavoatrial junction. Strandy densities LEFT lung base. Pulmonary vasculature appears normal, no pleural effusion  or focal consolidation. No pneumothorax. Large body habitus. Osseous structures are nonsuspicious. IMPRESSION: Similar cardiomegaly, LEFT lung base atelectasis. Electronically Signed   By: Elon Alas M.D.   On: 12/21/2015 06:49    Cardiac Catheterization: 01/01/2016   Ost LAD lesion, 30 %stenosed.  Dist LAD lesion, 30 %stenosed.  Prox RCA to Mid RCA lesion, 0 %stenosed.  1st RPLB lesion, 50 %stenosed.  A STENT XIENCE ALPINE RX 1.51V61 drug eluting stent was successfully placed.  1st Diag lesion, 95 %stenosed.  Post intervention, there is a 0% residual stenosis.  Dist RCA lesion, 30 %stenosed.   Two-vessel coronary obstructive disease with mild calcification of the proximal LAD with 30% narrowing, progressive 95% mid diagonal stenosis, 30% mid LAD narrowing; normal left circumflex coronary artery; and calcified large RCA with a widely patent stent in the mid RCA at the site of previous atherectomy, 30% acute margin narrowing, and 50% inferior branch PDA stenosis.  Successful PCI to the diagonal vessel with ultimate insertion of a 2.2515 mm Xience Alpine DES stent postdilated with a 2.4 mm with the 95% stenosis being reduced to 0%.  RECOMMENDATION: The patient will continue dual antiplatelet therapy with aspirin and Brilinta.  Medical therapy for concomitant CAD.  Assessment & Plan    1. CAD/NSTEMI - has known CAD with complex PCI of the heavily calcified RCA in December 2015 with rotablator atherectomy and placement of a DES in the mid RCA. He has moderate residual disease in the distal RCA post balloon angioplasty and moderately severe disease in the moderate caliber Diagonal branch.  - troponin was elevated with a peak of 18.04 on admission in the setting of profound anemia and metabolic derangements (hyperkalemia) leading to respiratory failure requiring intubation at time of admission. He did have left arm pain on the day of admission.  - Echo 12/23/15 with normal LV  function. Nuclear stress test 12/29/15 with possible anterior wall ischemia.  - cath on 9/8/ showed two-vessel CAD with 30% LAD stenosis, 50% 1st RPLB lesion, and 95% 1st Diag with successful placement of a DES. Started on DAPT with ASA and Brilinta. Continue statin and BB.  Will send staff message to arrange for hospital follow-up.   2. ESRD - for repeat HD today following cath yesterday.   3. Anemia - suspected due to friable gastric mucosa on EGD 12/25/15.  - No active bleeding. He has been transfused and H/H is stable.    4. Epistaxis - occurred following cath yesterday. Cauterized with silver nitrate. - appreciate assistance from Dr. Constance Holster.  Arna Medici , PA-C 7:19 AM 01/02/2016 Pager: [phone]  Attending Note:   The patient was seen and examined.  Agree with assessment and plan as noted above.  Changes made to the above note as needed.  Patient seen and independently examined with Bernerd Pho, [place] .   We discussed all aspects of the encounter. I agree with the assessment and plan as stated above.  Pt is doing better. No angina.   Had a nose bleed - has resolved. Also has a groin hematoma that is improving   Examined in HD this am  He should be ready for DC tomorrow     I have spent a total of 30 minutes with patient reviewing hospital  notes , telemetry, EKGs, labs and examining patient as well as establishing an assessment and plan that was discussed with the patient. > 50% of time was spent in direct patient care.    Thayer Headings, Brooke Bonito., MD, [facility] 01/02/2016, 8:31 AM [address]  [place] Pager [phone]

## 2016-01-02 NOTE — Progress Notes (Signed)
Please HOLD Brilinta for dialysis catheter removal on Monday. Will restart after the catheter is out.  Thanks.  Leontine Locket, [facility] 01/02/2016 4:38 PM

## 2016-01-02 NOTE — Progress Notes (Signed)
[facility] Progress Note   Subjective: "I'm doing alright".  No C/O chest pain/no abdominal pain/no nose bleed.  Concerned about plan for DC.   Objective Vitals:   01/02/16 0730 01/02/16 0740 01/02/16 0800 01/02/16 0827  BP: 127/65 131/71 127/63 (!) 89/58  Pulse: 88 91 85 84  Resp: 18 (!) 21 18 16   Temp: 97.6 F (36.4 C)     TempSrc: Oral     SpO2: 98%  100% 99%  Weight: 127.9 kg (281 lb 15.5 oz)     Height:       Physical Exam General: well nourished, NAD Heart:S1,S2, RRR SR on monitor Lungs: BBS CTA A/P Abdomen: obese, non-tender, nondistended Extremities: L BKA no LE edema Dialysis Access: LUA AVF cannulated with 16g needles. RIJ TDC drsg CDI.   Dialysis Orders: MWF AF 4h 22min 2/2.25 bath 127kg Hep 7000 P4 IJ cath/ LUA AVF (have started using AVF at OP center approx 2 wks now, just got down to 15 ga w 2 needles)  No ESA, weekly Fe at center  Additional Objective Labs: Basic Metabolic Panel:  Recent Labs Lab 01/01/16 0514 01/01/16 1538 01/01/16 2238 01/02/16 0513 01/02/16 0817  NA 134* 128*  --  131* 131*  K 3.7 5.5* 4.8 4.5 4.1  CL 98* 95*  --  96* 96*  CO2 26 19*  --  21* 21*  GLUCOSE 135* 305*  --  219* 171*  BUN 32* 44*  --  61* 62*  CREATININE 5.32* 6.31*  --  7.61* 7.77*  CALCIUM 8.4* 8.3*  --  8.6* 8.6*  PHOS 4.5 6.3*  --   --  6.9*    CBC:  Recent Labs Lab 12/31/15 1400 01/01/16 0514 01/01/16 1955 01/02/16 0050 01/02/16 0513  WBC 6.4 8.3 12.3* 11.6* 11.2*  HGB 9.1* 9.5* 8.9* 8.3* 8.2*  HCT 28.0* 29.5* 27.5* 26.0* 24.6*  MCV 92.1 92.8 92.0 92.5 91.4  PLT 238 263 277 285 266   Blood Culture    Component Value Date/Time   SDES BLOOD LEFT HAND 07/04/2012 2014   SPECREQUEST BOTTLES DRAWN AEROBIC ONLY 4CC 07/04/2012 2014   CULT NO GROWTH 5 DAYS 07/04/2012 2014   REPTSTATUS 07/11/2012 FINAL 07/04/2012 2014    Cardiac Enzymes: No results for input(s): CKTOTAL, CKMB, CKMBINDEX, TROPONINI in the last 168  hours. CBG:  Recent Labs Lab 01/01/16 0923 01/01/16 1412 01/01/16 1706 01/01/16 2015 01/02/16 0607  GLUCAP 149* 269* 286* 208* 213*   Iron Studies: No results for input(s): IRON, TIBC, TRANSFERRIN, FERRITIN in the last 72 hours. @lablastinr3 @ Studies/Results: Dg Chest Port 1 View  Result Date: 01/01/2016 CLINICAL DATA:  Cardiac catheterization with stent placement, postprocedural. EXAM: PORTABLE CHEST 1 VIEW COMPARISON:  12/23/2015 FINDINGS: Right IJ dialysis catheter tip:  Right atrium. Prior nasogastric tube is been removed. Prior left IJ line has been removed. The patient is rotated to the right on today's radiograph, reducing diagnostic sensitivity and specificity. Mild to moderate enlargement of the cardiopericardial silhouette, without edema. No pneumothorax. Linear subsegmental atelectasis or scarring at the left lung base, otherwise the lungs appear clear. IMPRESSION: 1. Mild-to-moderate enlargement of the cardiopericardial silhouette but without current evidence of edema or pulmonary venous hypertension. 2. Right IJ dialysis catheter tip:  Right atrium. Electronically Signed   By: Van Clines M.D.   On: 01/01/2016 19:43   Medications:   . aspirin  81 mg Oral Daily  . atorvastatin  80 mg Oral q1800  . feeding supplement (NEPRO CARB STEADY)  237 mL Oral Q24H  . fluticasone  1 spray Each Nare Daily  . gabapentin  200 mg Oral TID  . insulin aspart  0-20 Units Subcutaneous TID WC  . insulin aspart  6 Units Subcutaneous TID WC  . insulin glargine  22 Units Subcutaneous BID  . ipratropium-albuterol  3 mL Nebulization BID  . levothyroxine  100 mcg Oral QAC breakfast  . loratadine  5 mg Oral BID  . metoprolol tartrate  12.5 mg Oral BID  . oxymetazoline  1 spray Each Nare BID  . pantoprazole  40 mg Oral BID AC  . predniSONE  10 mg Oral Q breakfast  . rOPINIRole  2 mg Oral Q supper  . sodium chloride flush  3 mL Intravenous Q12H  . sodium chloride flush  3 mL Intravenous  Q12H  . ticagrelor  90 mg Oral BID     Assessment/Plan:  1. Acute GI Bleed- friable gastric mucosa on EGD w remnants from bleeding. No identifiable source of bleeding found. S/P prbc's (6-8 units total so far). PPI. HGB 9.5 prior to nose bleed 01/01/16-fell to 8.3 today. No overt bleeding at present. Follow HGB.  2. CP/ NSTEMI: peak trop 18, abnormal stress, Had cardiac cath today per Dr. Dwyane Dee DES placed 1st diag-95% stenosed-0% residual post procedure. L groin ecchymotic. No chest pain.  3. Volume  - close to dry wt today, clear lungs, minimal edema. On hemodialysis off schedule. Pre wt 127.9 UFG  3500. Lower EDW on DC as tolerated.   4. ESRD- MWF HD, has cath and using AVF at center, 15ga w 2 needles. K+ post cath 5.5. Now 4.1 2.0 K bath.  5. ChronicHypotension-on midodrine pre HD. BP soft, may need to back off UFG to 3500. Did not receive midodrine prior to HD. Give now.  6. Anemia- not on ESA at center, started ESA now darbe 80 mg/ wk; tsat 34% 7. S/p VDRF 8. DM2 - on insulin  9. Resp failure/ VDRF - resolved 10. Nose Bleed: ENT consult called per primary. Bleeding areas cauterized, No recurrence of nose bleed.   Rita H. Brown NP-C 01/02/2016, 8:41 AM  [facility] [phone]  Pt seen, examined and agree w A/P as above.  Kelly Splinter MD [name] pager [phone]    cell [phone] 01/02/2016, 10:41 AM

## 2016-01-02 NOTE — Progress Notes (Signed)
Pt currently in dialysis,  Called report to [facility] on 3W, moved all patient belongings to 3w-36, notified hemodialysis that patient was moving to 3w-36

## 2016-01-02 NOTE — Progress Notes (Signed)
Inpatient Diabetes Program Recommendations  AACE/ADA: New Consensus Statement on Inpatient Glycemic Control (2015)  Target Ranges:  Prepandial:   less than 140 mg/dL      Peak postprandial:   less than 180 mg/dL (1-2 hours)      Critically ill patients:  140 - 180 mg/dL   Lab Results  Component Value Date   GLUCAP 152 (H) 01/02/2016   HGBA1C 6.3 (H) 12/30/2015    Review of Glycemic ControlResults for DAMONEY, JULIA (MRN [id number]) as of 01/02/2016 15:33  Ref. Range 01/01/2016 14:12 01/01/2016 17:06 01/01/2016 20:15 01/02/2016 06:07 01/02/2016 13:54  Glucose-Capillary Latest Ref Range: 65 - 99 mg/dL 269 (H) 286 (H) 208 (H) 213 (H) 152 (H)   Diabetes history:DM1 Outpatient Diabetes medications: Animas insulin pump with Humalog (followed by Dr. Starleen Blue with [facility]) Per Diabetes Coordinator note from 12/21/15:  NOTE: In reviewing chart, noted in Care Everywhere tab that patient is noted to have Type 1 diabetes and is followed by Dr. Starleen Blue with [facility] and was last seen by Dr. Starleen Blue on 08/20/15. According to home medication list the following are patient's basal rates on his insulin pump: 2400 1.5 units/hr, 0300 1.9 units/hr, 0800 1.45 units/hr, 1200 1.625 units/hr Current orders for Inpatient glycemic control: Lantus 22 units bid, Novolog resistant tid with meals, Novolog 6 units tid with meals  Inpatient Diabetes Program Recommendations:  Note referral for insulin pump management. Patient is currently on basal/bolus regimen.  He however did not receive Lantus this morning.  Therefore could resume insulin pump this evening at 8 pm if he has his supplies and is alert and able.  Discussed with RN.  MD if insulin pump is to be resumed this evening, please order "Insulin pump" order set and d/c Lantus and Novolog.    Thanks, Adah Perl, RN, BC-ADM Inpatient Diabetes Coordinator Pager [phone] (8a-5p)

## 2016-01-02 NOTE — Progress Notes (Signed)
PROGRESS NOTE    Dale [name]  BJY:[id number] DOB: 1965/10/04 DOA: 12/21/2015 PCP: [name], MD    Brief Narrative: Dale [name] is a 50 y.o. male with PMH as outlined below including ESRD (M/W/F).  Per nephrology service, pt is extremely compliant with HD never missing any sessions.  On 8/27 he developed L arm pain which he initially thought was due to his "fistula not working".  Pain resolved spontaneously therefore he did feel that he needed to be evaluated.  The following morning 8/28, when he woke up to go to HD, pain was back and was worse.  He therefore went to ED for further evaluation.  In ED, he was found to have K 7, SCr 12.37, BUN 219, Hgb 6.9.  He was taken for urgent HD and after roughly 1 hour, he began to have diaphoresis, persistent L arm pain which radiated into chest, SOB, worsening anxiety.   PCCM was called and pt was emergently intubated while in HD unit.  He was transferred to ICU for further evaluation / management.  He is started on insulin drip the evening on 9/5 for elevated blood sugar, he is transitioned to subQ insulin 9/6 morning with taper steroids  Assessment & Plan:   Principal Problem:   NSTEMI (non-ST elevated myocardial infarction) ([place]) Active Problems:   S/P BKA (below knee amputation) ([place])   Anemia   HTN (hypertension)   Coronary artery disease involving native coronary artery of native heart with unstable angina pectoris ([place])   HLD (hyperlipidemia)   Gastrointestinal hemorrhage with melena   Encounter for intubation   Acute blood loss anemia   Uremia   Acute encephalopathy   Troponin level elevated   Encounter for central line placement   Stridor   Iron deficiency anemia   Anemia - suspect hemorrhagic due to GI bleed PPI oral BID.  Endoscopy 9-01; normal esophagus, erythematous mucosa in the cardia, gastric fundus and gastric body. No source for bleeding identified.  S/p prbc transfusion on 8/28, 9/1, 9/4 Unable to  have PRBC on 9/3 due to feeling of throat tightness, after transfusion stop patient improved.  hgb stable around 9 on 9/5 9/6  ESRD (M/W/F). Hyperkalemia - s/p emergent HD 8/28. Uremia - concern due to GI bleed. Hyponatremia. AGMA - uremia Improved with Dialysis.  Appreciate nephrology input Vascular surgery for dialysis access management, vascular surgery to coordinate with cardiology for brillinta management   Acute hypoxic respiratory failure. He was intubated on 8/28 and extubated on 8/30, difficult intubation per critical care Wean oxygen,volume to be managed by dialysis  Stridor/horseness: He developed stridor on 9/3, critical care reconsulted, patient received iv steroids, nebs, no racemic epi needed, ct soft tissue neck no acute findings, does reveal trachoemalacia,  ENT consulted on 9/5, flex laryngoscope consistent with reflux and posterior left cord edema. Outpatient ENT follow up recommended  Hypotension - suspect hemorrhagic due to GI bleed along with component hypovolemia - Received levophed.  BP improved. Now elevated, he was not on bp meds prior to hospitalization, will continue dialysis for volume control, consider coreg if bp continue to be elevated, for now prn hydralazine, taper steroids  N-STEMI; Positive troponin;- suspect demand ischemia due to GI bleed.  Will consider NSTEMI given chest pain however vs demand ischemia ECHO; EF 65 %  Troponin: -7.9----18----5.4 Cardiology consulted , input appreciated cardiac cath with DES on 9/8.   On DAPT.  Hx HTN, HLD, CAD, dCHF (Echo Nov 2016 with EF 60-65%, G2DD).  Insulin dependent DM;  (on insulin pump at home, he did not use pump while in the hospital)  He is started on insulin drip the evening on 9/5 for elevated blood sugar, he is transitioned to subQ insulin 9/6 morning with taper steroids  Acute encephalopathy improved Hx fibromyalgia. Chronic pain.  On gabapentin Started on ropinirole. Alert oriented.  On  oral dilaudid at home.  IV dilaudid PRN.    Morbid obesity: Body mass index is 43.44 kg/m. cpap at night  Epistaixs: post cath, topical afrin for now, monitor cbc closely, transfuse prn , ENT consulted.  Hypoxia: likely related to poor wave form, patient denies chest pain or sob at rest, but will get cxr in this complicate patient who has tendency for fluids retention.        DVT prophylaxis: SCD.  Code Status: Full code.  Family Communication: Wife 9-02, 9/8 Disposition Plan:  Need cardiology and nephrology clearance , possibly Monday after dialysis catheter removal by vascular surgery    Consultants:   CCM admitted patient. Transferred to [facility] on 9/1  GI  Nephrology   ENT on 9/5 for hoarseness, ENT reconsulted on 9/8 for epistaxis  Cardiology  Vascular surgery   Procedures:  Endoscopy 9-01  Flex laryngoscope on 9/5  Dialysis mwf  Cardiac cath with DEs on 9/8  Dialysis catheter removal planned on 9/9  Antimicrobials:   none   Subjective: No new complaints, no more nose bleed, no chest pain      Objective: Vitals:   01/02/16 1130 01/02/16 1159 01/02/16 1200 01/02/16 1250  BP: (!) 114/59 124/64 (!) 124/59 (!) 125/51  Pulse: 89 82 91 86  Resp: 20 17 19 18   Temp:   97.7 F (36.5 C) 98.3 F (36.8 C)  TempSrc:   Oral Oral  SpO2: 100% 100% 100% 100%  Weight:   126.9 kg (279 lb 12.2 oz) 129.6 kg (285 lb 11.5 oz)  Height:    5\' 8"  (1.727 m)    Intake/Output Summary (Last 24 hours) at 01/02/16 1846 Last data filed at 01/02/16 1200  Gross per 24 hour  Intake                0 ml  Output             1000 ml  Net            -1000 ml   Filed Weights   01/02/16 0730 01/02/16 1200 01/02/16 1250  Weight: 127.9 kg (281 lb 15.5 oz) 126.9 kg (279 lb 12.2 oz) 129.6 kg (285 lb 11.5 oz)    Examination:  General exam:  NAD obese Respiratory system: diminished, wheezing has resolved Cardiovascular system: S1 & S2 heard, RRR. No JVD, murmurs, rubs,  gallops or clicks. No pedal edema. Gastrointestinal system: Abdomen is nondistended, soft and nontender. No organomegaly or masses felt. Normal bowel sounds heard. Central nervous system: Alert and oriented. No focal neurological deficits. Extremities: Symmetric 5 x 5 power. S/p left bka with prosthetic  Skin: No rashes, lesions or ulcers Psychiatry: Judgement and insight appear normal. Mood & affect appropriate.     Data Reviewed: I have personally reviewed following labs and imaging studies  CBC:  Recent Labs Lab 12/31/15 1400 01/01/16 0514 01/01/16 1955 01/02/16 0050 01/02/16 0513  WBC 6.4 8.3 12.3* 11.6* 11.2*  HGB 9.1* 9.5* 8.9* 8.3* 8.2*  HCT 28.0* 29.5* 27.5* 26.0* 24.6*  MCV 92.1 92.8 92.0 92.5 91.4  PLT 238 263 277 285 266  Basic Metabolic Panel:  Recent Labs Lab 12/28/15 0259  12/29/15 0502  12/30/15 0413  01/01/16 0009 01/01/16 6767 01/01/16 1538 01/01/16 2238 01/02/16 0513 01/02/16 0817 01/02/16 1603  NA 127*  < > 130*  < >  --   < > 131* 134* 128*  --  131* 131* 131*  K 4.8  < > 4.3  < >  --   < > 4.8 3.7 5.5* 4.8 4.5 4.1 3.5  CL 90*  < > 96*  < >  --   < > 98* 98* 95*  --  96* 96* 95*  CO2 21*  < > 23  < >  --   < > 19* 26 19*  --  21* 21* 25  GLUCOSE 396*  < > 201*  < >  --   < > 148* 135* 305*  --  219* 171* 301*  BUN 82*  < > 54*  < >  --   < > 54* 32* 44*  --  61* 62* 23*  CREATININE 9.77*  < > 7.07*  < >  --   < > 6.97* 5.32* 6.31*  --  7.61* 7.77* 4.18*  CALCIUM 8.3*  < > 8.5*  < >  --   < > 8.5* 8.4* 8.3*  --  8.6* 8.6* 7.8*  MG 2.2  --  2.1  --  2.2  --   --  2.0  --   --  2.2  --   --   PHOS 9.6*  < > 7.1*  < >  --   < > 6.0* 4.5 6.3*  --   --  6.9* 3.5  < > = values in this interval not displayed. GFR: Estimated Creatinine Clearance: 27.8 mL/min (by C-G formula based on SCr of 4.18 mg/dL). Liver Function Tests:  Recent Labs Lab 01/01/16 0514 01/01/16 1538 01/02/16 0513 01/02/16 0817 01/02/16 1603  AST  --   --  19  --   --   ALT   --   --  23  --   --   ALKPHOS  --   --  54  --   --   BILITOT  --   --  0.5  --   --   PROT  --   --  5.6*  --   --   ALBUMIN 2.7* 2.6* 2.5* 2.4* 2.7*   No results for input(s): LIPASE, AMYLASE in the last 168 hours. No results for input(s): AMMONIA in the last 168 hours. Coagulation Profile: No results for input(s): INR, PROTIME in the last 168 hours. Cardiac Enzymes: No results for input(s): CKTOTAL, CKMB, CKMBINDEX, TROPONINI in the last 168 hours. BNP (last 3 results) No results for input(s): PROBNP in the last 8760 hours. HbA1C: No results for input(s): HGBA1C in the last 72 hours. CBG:  Recent Labs Lab 01/01/16 1706 01/01/16 2015 01/02/16 0607 01/02/16 1354 01/02/16 1609  GLUCAP 286* 208* 213* 152* 308*   Lipid Profile: No results for input(s): CHOL, HDL, LDLCALC, TRIG, CHOLHDL, LDLDIRECT in the last 72 hours. Thyroid Function Tests: No results for input(s): TSH, T4TOTAL, FREET4, T3FREE, THYROIDAB in the last 72 hours. Anemia Panel: No results for input(s): VITAMINB12, FOLATE, FERRITIN, TIBC, IRON, RETICCTPCT in the last 72 hours. Sepsis Labs: No results for input(s): PROCALCITON, LATICACIDVEN in the last 168 hours.  No results found for this or any previous visit (from the past 240 hour(s)).       Radiology Studies: Dg Chest  Port 1 View  Result Date: 01/01/2016 CLINICAL DATA:  Cardiac catheterization with stent placement, postprocedural. EXAM: PORTABLE CHEST 1 VIEW COMPARISON:  12/23/2015 FINDINGS: Right IJ dialysis catheter tip:  Right atrium. Prior nasogastric tube is been removed. Prior left IJ line has been removed. The patient is rotated to the right on today's radiograph, reducing diagnostic sensitivity and specificity. Mild to moderate enlargement of the cardiopericardial silhouette, without edema. No pneumothorax. Linear subsegmental atelectasis or scarring at the left lung base, otherwise the lungs appear clear. IMPRESSION: 1. Mild-to-moderate enlargement  of the cardiopericardial silhouette but without current evidence of edema or pulmonary venous hypertension. 2. Right IJ dialysis catheter tip:  Right atrium. Electronically Signed   By: Van Clines M.D.   On: 01/01/2016 19:43        Scheduled Meds: . aspirin  81 mg Oral Daily  . atorvastatin  80 mg Oral q1800  . feeding supplement (NEPRO CARB STEADY)  237 mL Oral Q24H  . fluticasone  1 spray Each Nare Daily  . gabapentin  200 mg Oral TID  . insulin aspart  0-20 Units Subcutaneous TID WC  . insulin aspart  6 Units Subcutaneous TID WC  . insulin glargine  22 Units Subcutaneous BID  . ipratropium-albuterol  3 mL Nebulization BID  . levothyroxine  100 mcg Oral QAC breakfast  . loratadine  5 mg Oral BID  . metoprolol tartrate  12.5 mg Oral BID  . oxymetazoline  1 spray Each Nare BID  . pantoprazole  40 mg Oral BID AC  . predniSONE  10 mg Oral Q breakfast  . rOPINIRole  2 mg Oral Q supper  . sodium chloride flush  3 mL Intravenous Q12H  . sodium chloride flush  3 mL Intravenous Q12H  . ticagrelor  90 mg Oral BID   Continuous Infusions:     LOS: 12 days    Time spent: 25 minutes.     Florencia Reasons, MD PhD [facility] Pager [phone]  If 7PM-7AM, please contact night-coverage [email or web address] Password [facility] 01/02/2016, 6:46 PM

## 2016-01-03 LAB — GLUCOSE, CAPILLARY
Glucose-Capillary: 336 mg/dL — ABNORMAL HIGH (ref 65–99)
Glucose-Capillary: 168 mg/dL — ABNORMAL HIGH (ref 65–99)
Glucose-Capillary: 66 mg/dL (ref 65–99)
Glucose-Capillary: 68 mg/dL (ref 65–99)
Glucose-Capillary: 161 mg/dL — ABNORMAL HIGH (ref 65–99)
Glucose-Capillary: 405 mg/dL — ABNORMAL HIGH (ref 65–99)

## 2016-01-03 LAB — CBC
HCT: 23.5 % — ABNORMAL LOW (ref 39.0–52.0)
Hemoglobin: 7.5 g/dL — ABNORMAL LOW (ref 13.0–17.0)
MCH: 30.1 pg (ref 26.0–34.0)
MCHC: 31.9 g/dL (ref 30.0–36.0)
MCV: 94.4 fL (ref 78.0–100.0)
Platelets: 248 10*3/uL (ref 150–400)
RBC: 2.49 MIL/uL — ABNORMAL LOW (ref 4.22–5.81)
RDW: 17.1 % — ABNORMAL HIGH (ref 11.5–15.5)
WBC: 9.3 10*3/uL (ref 4.0–10.5)

## 2016-01-03 LAB — RENAL FUNCTION PANEL
Albumin: 2.7 g/dL — ABNORMAL LOW (ref 3.5–5.0)
Anion gap: 11 (ref 5–15)
BUN: 37 mg/dL — ABNORMAL HIGH (ref 6–20)
CO2: 24 mmol/L (ref 22–32)
Calcium: 7.8 mg/dL — ABNORMAL LOW (ref 8.9–10.3)
Chloride: 94 mmol/L — ABNORMAL LOW (ref 101–111)
Creatinine, Ser: 5.84 mg/dL — ABNORMAL HIGH (ref 0.61–1.24)
GFR calc Af Amer: 12 mL/min — ABNORMAL LOW (ref >60)
GFR calc non Af Amer: 10 mL/min — ABNORMAL LOW (ref >60)
Glucose, Bld: 352 mg/dL — ABNORMAL HIGH (ref 65–99)
Phosphorus: 4.1 mg/dL (ref 2.5–4.6)
Potassium: 4.1 mmol/L (ref 3.5–5.1)
Sodium: 129 mmol/L — ABNORMAL LOW (ref 135–145)

## 2016-01-03 LAB — MAGNESIUM: Magnesium: 1.9 mg/dL (ref 1.7–2.4)

## 2016-01-03 MED ORDER — PREDNISONE 5 MG PO TABS
5.0000 mg | ORAL_TABLET | Freq: Every day | ORAL | Status: DC
  Administered 2016-01-04: 5 mg via ORAL
  Filled 2016-01-03: qty 1

## 2016-01-03 MED ORDER — INSULIN ASPART 100 UNIT/ML ~~LOC~~ SOLN
10.0000 [IU] | Freq: Once | SUBCUTANEOUS | Status: AC
  Administered 2016-01-03: 10 [IU] via SUBCUTANEOUS

## 2016-01-03 NOTE — Progress Notes (Signed)
Patient started c/o feeling shaky and nauseated with HR in the 110's while getting report in the room and Gwen got a BS which was 46, she gave patient a cup of Orange juice, 4 graham crackers with peanut butter and a Nepro drink, will recheck BS and continue to monitor. All other report, orders, VS, meds and tests were reviewed via [place] and I assumed care of the patient.

## 2016-01-03 NOTE — Progress Notes (Signed)
Hospital Problem List     Principal Problem:   NSTEMI (non-ST elevated myocardial infarction) ([place]) Active Problems:   S/P BKA (below knee amputation) ([place])   Anemia   HTN (hypertension)   Coronary artery disease involving native coronary artery of native heart with unstable angina pectoris ([place])   HLD (hyperlipidemia)   Gastrointestinal hemorrhage with melena   Encounter for intubation   Acute blood loss anemia   Uremia   Acute encephalopathy   Troponin level elevated   Encounter for central line placement   Stridor   Iron deficiency anemia    Patient Profile:   Primary Cardiologist: Dr. [name]  Subjective   Denies any chest discomfort or palpitations overnight. Left groin hematoma resolved. Had episode of epistaxis yesterday afternoon, now resolved as well. For HD this morning.   Inpatient Medications    . aspirin  81 mg Oral Daily  . atorvastatin  80 mg Oral q1800  . feeding supplement (NEPRO CARB STEADY)  237 mL Oral Q24H  . fluticasone  1 spray Each Nare Daily  . gabapentin  200 mg Oral TID  . insulin aspart  0-20 Units Subcutaneous TID WC  . insulin aspart  6 Units Subcutaneous TID WC  . insulin glargine  22 Units Subcutaneous BID  . ipratropium-albuterol  3 mL Nebulization BID  . levothyroxine  100 mcg Oral QAC breakfast  . loratadine  5 mg Oral BID  . metoprolol tartrate  12.5 mg Oral BID  . oxymetazoline  1 spray Each Nare BID  . pantoprazole  40 mg Oral BID AC  . predniSONE  10 mg Oral Q breakfast  . rOPINIRole  2 mg Oral Q supper  . sodium chloride flush  3 mL Intravenous Q12H  . sodium chloride flush  3 mL Intravenous Q12H  . ticagrelor  90 mg Oral BID    Vital Signs    Vitals:   01/02/16 2000 01/02/16 2039 01/02/16 2207 01/03/16 0445  BP: 124/65  124/65 (!) 112/47  Pulse: (!) 101 96 99 92  Resp: (!) 22 18  (!) 21  Temp: 99.6 F (37.6 C)   98.9 F (37.2 C)  TempSrc:      SpO2: 100% 99%  96%  Weight:    284 lb (128.8 kg)  Height:         Intake/Output Summary (Last 24 hours) at 01/03/16 0959 Last data filed at 01/03/16 6384  Gross per 24 hour  Intake              660 ml  Output             1000 ml  Net             -340 ml   Filed Weights   01/02/16 1200 01/02/16 1250 01/03/16 0445  Weight: 279 lb 12.2 oz (126.9 kg) 285 lb 11.5 oz (129.6 kg) 284 lb (128.8 kg)    Physical Exam    General: Obese Caucasian male appearing in no acute distress. Head: Normocephalic, atraumatic.  Neck: Supple without bruits, JVD not elevated Lungs:  Resp regular and unlabored, CTA without wheezing or rales. Heart: RRR, S1, S2, no S3, S4, or murmur; no rub. Abdomen: Soft, non-tender, non-distended with normoactive bowel sounds. No hepatomegaly. No rebound/guarding. No obvious abdominal masses. Extremities: No clubbing, cyanosis, or edema. Left AKA. Left groin with large ecchymosis. No hematoma appreciated.  Neuro: Alert and oriented X 3. Moves all extremities spontaneously. Psych: Normal affect.  Labs  CBC  Recent Labs  01/02/16 0513 01/03/16 0640  WBC 11.2* 9.3  HGB 8.2* 7.5*  HCT 24.6* 23.5*  MCV 91.4 94.4  PLT 266 591   Basic Metabolic Panel  Recent Labs  01/02/16 0513  01/02/16 1603 01/03/16 0640  NA 131*  < > 131* 129*  K 4.5  < > 3.5 4.1  CL 96*  < > 95* 94*  CO2 21*  < > 25 24  GLUCOSE 219*  < > 301* 352*  BUN 61*  < > 23* 37*  CREATININE 7.61*  < > 4.18* 5.84*  CALCIUM 8.6*  < > 7.8* 7.8*  MG 2.2  --   --  1.9  PHOS  --   < > 3.5 4.1  < > = values in this interval not displayed. Liver Function Tests  Recent Labs  01/02/16 0513  01/02/16 1603 01/03/16 0640  AST 19  --   --   --   ALT 23  --   --   --   ALKPHOS 54  --   --   --   BILITOT 0.5  --   --   --   PROT 5.6*  --   --   --   ALBUMIN 2.5*  < > 2.7* 2.7*  < > = values in this interval not displayed. No results for input(s): LIPASE, AMYLASE in the last 72 hours. Cardiac Enzymes No results for input(s): CKTOTAL, CKMB, CKMBINDEX,  TROPONINI in the last 72 hours. BNP Invalid input(s): POCBNP D-Dimer No results for input(s): DDIMER in the last 72 hours. Hemoglobin A1C No results for input(s): HGBA1C in the last 72 hours. Fasting Lipid Panel No results for input(s): CHOL, HDL, LDLCALC, TRIG, CHOLHDL, LDLDIRECT in the last 72 hours. Thyroid Function Tests No results for input(s): TSH, T4TOTAL, T3FREE, THYROIDAB in the last 72 hours.  Invalid input(s): FREET3  Telemetry    NSR, HR in 70's - 90's. No atopic events.   ECG    NSR, HR 82 with no acute ST or T-wave changes.    Cardiac Studies and Radiology    Ct Soft Tissue Neck Wo Contrast  Result Date: 12/27/2015 CLINICAL DATA:  50 year old male intubated a few days ago, sensation of throat closing up. Stridor. Initial encounter. EXAM: CT NECK WITHOUT CONTRAST TECHNIQUE: Multidetector CT imaging of the neck was performed following the standard protocol without intravenous contrast. COMPARISON:  Chest CTA 10/02/2014. FINDINGS: Large body habitus. Pharynx and larynx: The vocal cords are in close apposition (series 2, image 85) but this appears similar to the appearance on the 10/02/2014 chest CTA (series 5, image 5 of that exam). The supraglottic larynx and epiglottis are normal. The subglottic trachea does appear diminutive, but again similar to the prior chest CTA. Compare the Highly atelectatic appearance of the trachea on series 2, image 111 today to series 5, image 33 on the prior chest CTA. 7 Pharyngeal contours are within normal limits. Negative parapharyngeal and retropharyngeal spaces. Salivary glands: Negative sublingual space, submandibular glands and parotid glands. Thyroid: Diminutive common normal. Lymph nodes: Negative.  No cervical lymphadenopathy. Vascular: Right IJ approach dual lumen catheter partially visualized. Calcified distal cervical ICA atherosclerosis. Calcified ICA siphons and distal vertebral arteries. Vasculature not otherwise evaluated in the  absence of IV contrast. Limited intracranial: Negative. Visualized orbits: Postoperative changes to both globes. Mastoids and visualized paranasal sinuses: Small mucous retention cyst right sphenoid sinus. Other paranasal sinuses are clear. Partial opacification of both tympanic cavities and mastoid air  cells. The ossicles appear to remain intact. The external auditory canals appear fairly normal. Skeleton:  No acute osseous abnormality identified. Upper chest: Stable visible lung apices. Partially visible chronic mediastinal lipomatosis. IMPRESSION: 1. Tracheomalacia. Highly atelectatic appearance of the trachea today, but not significantly changed from a June Chest CTA (see series 2, image 111 today and series 5, image 33 on the 10/02/2014 chest CTA). 2. Likewise, the glottis appears stable since June. The supraglottic larynx is normal. No laryngeal trauma suspected. 3. Calcified atherosclerosis of the distal ICAs and vertebral arteries. Electronically Signed   By: Genevie Ann M.D.   On: 12/27/2015 18:46   Nm Myocar Multi W/spect W/wall Motion / Ef  Result Date: 12/29/2015  There was no ST segment deviation noted during stress.  No T wave inversion was noted during stress.  Defect 1: There is a medium defect of moderate severity present in the mid anterior, mid anterolateral, apical anterior and apical lateral location.  Findings consistent with apical anterolateral ischemia and prior apical anterolateral myocardial infarction.  This is an intermediate risk study.  The left ventricular ejection fraction is normal (55-65%).  Nuclear stress EF: 64%.    Dg Chest Port 1 View  Result Date: 01/01/2016 CLINICAL DATA:  Cardiac catheterization with stent placement, postprocedural. EXAM: PORTABLE CHEST 1 VIEW COMPARISON:  12/23/2015 FINDINGS: Right IJ dialysis catheter tip:  Right atrium. Prior nasogastric tube is been removed. Prior left IJ line has been removed. The patient is rotated to the right on today's  radiograph, reducing diagnostic sensitivity and specificity. Mild to moderate enlargement of the cardiopericardial silhouette, without edema. No pneumothorax. Linear subsegmental atelectasis or scarring at the left lung base, otherwise the lungs appear clear. IMPRESSION: 1. Mild-to-moderate enlargement of the cardiopericardial silhouette but without current evidence of edema or pulmonary venous hypertension. 2. Right IJ dialysis catheter tip:  Right atrium. Electronically Signed   By: Van Clines M.D.   On: 01/01/2016 19:43   Dg Chest Port 1 View  Result Date: 12/23/2015 CLINICAL DATA:  Respiratory failure. EXAM: PORTABLE CHEST 1 VIEW COMPARISON:  12/22/2015. FINDINGS: Endotracheal tube, NG tube, left eye J line, large caliber right IJ line in stable position. Cardiomegaly with mild pulmonary venous congestion and interstitial prominence suggesting mild congestive heart failure. Small left pleural effusion. Chest is unchanged from prior exam. No pneumothorax . IMPRESSION: 1. Lines and tubes in stable position. 2. Persistent changes of mild congestive heart failure with cardiomegaly, mild pulmonary venous congestion, mild interstitial edema and small left pleural effusion. No interim change from prior exam. Electronically Signed   By: Marcello Moores  Register   On: 12/23/2015 06:58   Dg Chest Port 1 View  Result Date: 12/22/2015 CLINICAL DATA:  Respiratory failure.  Assess endotracheal tube. EXAM: PORTABLE CHEST 1 VIEW COMPARISON:  Chest radiograph 12/21/2015 FINDINGS: Support lines and tubes are in unchanged position. The tip of the endotracheal tube remains at the level of the clavicular heads. Cardiomediastinal contours are unchanged. Persistent pulmonary vascular congestion. Small left pleural effusion. IMPRESSION: 1. Unchanged support lines and tubes. 2. Mild pulmonary edema. Electronically Signed   By: Ulyses Jarred M.D.   On: 12/22/2015 06:41   Dg Chest Port 1 View  Result Date: 12/21/2015 CLINICAL  DATA:  Status post central line placement. EXAM: PORTABLE CHEST 1 VIEW COMPARISON:  Single-view of the chest earlier today. FINDINGS: Endotracheal tube is again seen with the tip at the top of the clavicular heads, well above the carina. Right IJ approach dialysis catheter remains  in place. A new left IJ central venous catheter is seen with the tip projecting over the mid superior vena cava. No pneumothorax. There is cardiomegaly without edema. IMPRESSION: New left IJ catheter tip projects in the mid superior vena cava. Negative for pneumothorax. No other change. Electronically Signed   By: Inge Rise M.D.   On: 12/21/2015 15:07   Dg Chest Port 1 View  Result Date: 12/21/2015 CLINICAL DATA:  Intubated EXAM: PORTABLE CHEST 1 VIEW COMPARISON:  Chest radiograph from earlier today. FINDINGS: Endotracheal tube tip is 3.8 cm above the carina. Right internal jugular central venous catheter terminates at the cavoatrial junction. Enteric tube enters stomach with the tip not seen on this image. Stable cardiomediastinal silhouette with cardiomegaly. No pneumothorax. Stable probable trace bilateral pleural effusions. Mild pulmonary edema, not definitely changed. Patchy left lung base opacity appears increased. IMPRESSION: 1. Well-positioned support structures as described. 2. Stable mild congestive heart failure . 3. Increased patchy left lung base opacity, favor atelectasis, cannot exclude a component of pneumonia or aspiration. Electronically Signed   By: Ilona Sorrel M.D.   On: 12/21/2015 11:26   Dg Chest Port 1 View  Result Date: 12/21/2015 CLINICAL DATA:  LEFT chest and arm pain for 2 days. History of diabetes, renal failure, CHF. EXAM: PORTABLE CHEST 1 VIEW COMPARISON:  Chest radiograph March 09, 2015 FINDINGS: Cardiac silhouette is similarly enlarged. Mediastinal silhouette is nonsuspicious, tunneled dialysis catheter via RIGHT internal jugular venous approach with distal tips projecting cavoatrial  junction. Strandy densities LEFT lung base. Pulmonary vasculature appears normal, no pleural effusion or focal consolidation. No pneumothorax. Large body habitus. Osseous structures are nonsuspicious. IMPRESSION: Similar cardiomegaly, LEFT lung base atelectasis. Electronically Signed   By: Elon Alas M.D.   On: 12/21/2015 06:49    Cardiac Catheterization: 01/01/2016   Ost LAD lesion, 30 %stenosed.  Dist LAD lesion, 30 %stenosed.  Prox RCA to Mid RCA lesion, 0 %stenosed.  1st RPLB lesion, 50 %stenosed.  A STENT XIENCE ALPINE RX [id number] drug eluting stent was successfully placed.  1st Diag lesion, 95 %stenosed.  Post intervention, there is a 0% residual stenosis.  Dist RCA lesion, 30 %stenosed.   Two-vessel coronary obstructive disease with mild calcification of the proximal LAD with 30% narrowing, progressive 95% mid diagonal stenosis, 30% mid LAD narrowing; normal left circumflex coronary artery; and calcified large RCA with a widely patent stent in the mid RCA at the site of previous atherectomy, 30% acute margin narrowing, and 50% inferior branch PDA stenosis.  Successful PCI to the diagonal vessel with ultimate insertion of a 2.2515 mm Xience Alpine DES stent postdilated with a 2.4 mm with the 95% stenosis being reduced to 0%.  RECOMMENDATION: The patient will continue dual antiplatelet therapy with aspirin and Brilinta.  Medical therapy for concomitant CAD.  Assessment & Plan    1. CAD/NSTEMI - has known CAD with complex PCI of the heavily calcified RCA in December 2015 with rotablator atherectomy and placement of a DES in the mid RCA. He has moderate residual disease in the distal RCA post balloon angioplasty and moderately severe disease in the moderate caliber Diagonal branch.  - troponin was elevated with a peak of 18.04 on admission in the setting of profound anemia and metabolic derangements (hyperkalemia) leading to respiratory failure requiring intubation at time  of admission. He did have left arm pain on the day of admission.  - Echo 12/23/15 with normal LV function. Nuclear stress test 12/29/15 with possible anterior wall ischemia.  -  cath on 9/8/ showed two-vessel CAD with 30% LAD stenosis, 50% 1st RPLB lesion, and 95% 1st Diag with successful placement of a DES. Started on DAPT with ASA and Brilinta. Continue statin and BB.    Follow up with Dr. [name] in the office. Possible DC today    2. ESRD He needs to have his right subclavian dialysis catheter pulled soon. I would not recommend holding Brilinta since he just had a stent placed several days ago.  3. Anemia - suspected due to friable gastric mucosa on EGD 12/25/15.  - No active bleeding. He has been transfused and H/H is stable.   4. Epistaxis - occurred following cath yesterday. Cauterized with silver nitrate. - appreciate assistance from Dr. Constance Holster.   Mertie Moores, MD  01/03/2016 10:01 AM    [address],  [place], [place]  [address] Pager [phone] Phone: [phone]; Fax: [phone]

## 2016-01-03 NOTE — Progress Notes (Addendum)
PROGRESS NOTE    Dale [name]  IRW:[id number] DOB: 05/11/1965 DOA: 12/21/2015 PCP: [name], MD    Brief Narrative: Dale [name] is a 50 y.o. male with PMH as outlined below including ESRD (M/W/F).  Per nephrology service, pt is extremely compliant with HD never missing any sessions.  On 8/27 he developed L arm pain which he initially thought was due to his "fistula not working".  Pain resolved spontaneously therefore he did feel that he needed to be evaluated.  The following morning 8/28, when he woke up to go to HD, pain was back and was worse.  He therefore went to ED for further evaluation.  In ED, he was found to have K 7, SCr 12.37, BUN 219, Hgb 6.9.  He was taken for urgent HD and after roughly 1 hour, he began to have diaphoresis, persistent L arm pain which radiated into chest, SOB, worsening anxiety.   PCCM was called and pt was emergently intubated while in HD unit.  He was transferred to ICU for further evaluation / management.  He is started on insulin drip the evening on 9/5 for elevated blood sugar, he is transitioned to subQ insulin 9/6 morning with taper steroids  Assessment & Plan:   Principal Problem:   NSTEMI (non-ST elevated myocardial infarction) ([place]) Active Problems:   S/P BKA (below knee amputation) ([place])   Anemia   HTN (hypertension)   Coronary artery disease involving native coronary artery of native heart with unstable angina pectoris ([place])   HLD (hyperlipidemia)   Gastrointestinal hemorrhage with melena   Encounter for intubation   Acute blood loss anemia   Uremia   Acute encephalopathy   Troponin level elevated   Encounter for central line placement   Stridor   Iron deficiency anemia   Anemia - suspect hemorrhagic due to GI bleed PPI oral BID.  Endoscopy 9-01; normal esophagus, erythematous mucosa in the cardia, gastric fundus and gastric body. No source for bleeding identified.  S/p prbc transfusion on 8/28, 9/1, 9/4 Unable to  have PRBC on 9/3 due to feeling of throat tightness, after transfusion stop patient improved.  hgb drop again on 9/10, no overt sign of bleeding, may need prbc transfusion during dialysis tomorrow on 9/11  ESRD (M/W/F). Hyperkalemia - s/p emergent HD 8/28. Uremia - concern due to GI bleed. Hyponatremia. AGMA - uremia Improved with Dialysis.  Appreciate nephrology input Vascular surgery for dialysis access management, vascular surgery to coordinate with cardiology for brillinta management   Acute hypoxic respiratory failure. He was intubated on 8/28 and extubated on 8/30, difficult intubation per critical care Wean oxygen,volume to be managed by dialysis  Stridor/horseness: He developed stridor on 9/3, critical care reconsulted, patient received iv steroids, nebs, no racemic epi needed, ct soft tissue neck no acute findings, does reveal trachoemalacia,  ENT consulted on 9/5, flex laryngoscope consistent with reflux and posterior left cord edema. Outpatient ENT follow up recommended  Hypotension - suspect hemorrhagic due to GI bleed along with component hypovolemia - Received levophed.  BP improved. Now elevated, he was not on bp meds prior to hospitalization, will continue dialysis for volume control, consider coreg if bp continue to be elevated, for now prn hydralazine, taper steroids  N-STEMI; Positive troponin;- suspect demand ischemia due to GI bleed.  Will consider NSTEMI given chest pain however vs demand ischemia ECHO; EF 65 %  Troponin: -7.9----18----5.4 Cardiology consulted , input appreciated cardiac cath with DES on 9/8.   On DAPT.  Hx HTN, HLD, CAD, dCHF (Echo Nov 2016 with EF 60-65%, G2DD).  Insulin dependent DM;  (on insulin pump at home, he did not use pump while in the hospital)  He is started on insulin drip the evening on 9/5 for elevated blood sugar, he is transitioned to subQ insulin 9/6 morning with taper steroids  Acute encephalopathy improved Hx fibromyalgia.  Chronic pain.  On gabapentin Started on ropinirole. Alert oriented.  On oral dilaudid at home.  IV dilaudid PRN.    Morbid obesity: Body mass index is 43.18 kg/m. cpap at night  Epistaixs: post cath, topical afrin ,  ENT consulted. Right anterior septal epistaxis cauterized with a silver nitrate stick , ENT input appreciated, Bleeding stopped         DVT prophylaxis: SCD.  Code Status: Full code.  Family Communication: Wife 9-02, 9/8 Disposition Plan:  Need cardiology and nephrology clearance , possibly Monday after dialysis and  catheter removal by vascular surgery    Consultants:   CCM admitted patient. Transferred to [facility] on 9/1  GI  Nephrology   ENT on 9/5 for hoarseness, ENT reconsulted on 9/8 for epistaxis  Cardiology  Vascular surgery   Procedures:  Endoscopy 9-01  Flex laryngoscope on 9/5  Dialysis mwf  Cardiac cath with DEs on 9/8  Dialysis catheter removal planned on 9/9  Antimicrobials:   none   Subjective: Had brief episode of hypoglycemia episode yesterday, cbg down to 50's, corrected, am blood glucose elevated.  No new complaints, no more nose bleed, no chest pain , has intermittent dry cough since after extubation.     Objective: Vitals:   01/02/16 2000 01/02/16 2039 01/02/16 2207 01/03/16 0445  BP: 124/65  124/65 (!) 112/47  Pulse: (!) 101 96 99 92  Resp: (!) 22 18  (!) 21  Temp: 99.6 F (37.6 C)   98.9 F (37.2 C)  TempSrc:      SpO2: 100% 99%  96%  Weight:    128.8 kg (284 lb)  Height:        Intake/Output Summary (Last 24 hours) at 01/03/16 0828 Last data filed at 01/03/16 0447  Gross per 24 hour  Intake              360 ml  Output             1000 ml  Net             -640 ml   Filed Weights   01/02/16 1200 01/02/16 1250 01/03/16 0445  Weight: 126.9 kg (279 lb 12.2 oz) 129.6 kg (285 lb 11.5 oz) 128.8 kg (284 lb)    Examination:  General exam:  NAD obese Respiratory system: diminished, wheezing has  resolved Cardiovascular system: S1 & S2 heard, RRR. No JVD, murmurs, rubs, gallops or clicks. No pedal edema. Gastrointestinal system: Abdomen is nondistended, soft and nontender. No organomegaly or masses felt. Normal bowel sounds heard. Central nervous system: Alert and oriented. No focal neurological deficits. Extremities: Symmetric 5 x 5 power. S/p left bka with prosthetic , left arm AVF dialysis fistula, right IJ dialysis catheter. Skin: No rashes, lesions or ulcers Psychiatry: Judgement and insight appear normal. Mood & affect appropriate.     Data Reviewed: I have personally reviewed following labs and imaging studies  CBC:  Recent Labs Lab 01/01/16 0514 01/01/16 1955 01/02/16 0050 01/02/16 0513 01/03/16 0640  WBC 8.3 12.3* 11.6* 11.2* 9.3  HGB 9.5* 8.9* 8.3* 8.2* 7.5*  HCT 29.5* 27.5* 26.0* 24.6*  23.5*  MCV 92.8 92.0 92.5 91.4 94.4  PLT 263 277 285 266 253   Basic Metabolic Panel:  Recent Labs Lab 12/29/15 0502  12/30/15 0413  01/01/16 0514 01/01/16 1538 01/01/16 2238 01/02/16 0513 01/02/16 0817 01/02/16 1603 01/03/16 0640  NA 130*  < >  --   < > 134* 128*  --  131* 131* 131* 129*  K 4.3  < >  --   < > 3.7 5.5* 4.8 4.5 4.1 3.5 4.1  CL 96*  < >  --   < > 98* 95*  --  96* 96* 95* 94*  CO2 23  < >  --   < > 26 19*  --  21* 21* 25 24  GLUCOSE 201*  < >  --   < > 135* 305*  --  219* 171* 301* 352*  BUN 54*  < >  --   < > 32* 44*  --  61* 62* 23* 37*  CREATININE 7.07*  < >  --   < > 5.32* 6.31*  --  7.61* 7.77* 4.18* 5.84*  CALCIUM 8.5*  < >  --   < > 8.4* 8.3*  --  8.6* 8.6* 7.8* 7.8*  MG 2.1  --  2.2  --  2.0  --   --  2.2  --   --  1.9  PHOS 7.1*  < >  --   < > 4.5 6.3*  --   --  6.9* 3.5 4.1  < > = values in this interval not displayed. GFR: Estimated Creatinine Clearance: 19.8 mL/min (by C-G formula based on SCr of 5.84 mg/dL). Liver Function Tests:  Recent Labs Lab 01/01/16 1538 01/02/16 0513 01/02/16 0817 01/02/16 1603 01/03/16 0640  AST  --  19   --   --   --   ALT  --  23  --   --   --   ALKPHOS  --  54  --   --   --   BILITOT  --  0.5  --   --   --   PROT  --  5.6*  --   --   --   ALBUMIN 2.6* 2.5* 2.4* 2.7* 2.7*   No results for input(s): LIPASE, AMYLASE in the last 168 hours. No results for input(s): AMMONIA in the last 168 hours. Coagulation Profile: No results for input(s): INR, PROTIME in the last 168 hours. Cardiac Enzymes: No results for input(s): CKTOTAL, CKMB, CKMBINDEX, TROPONINI in the last 168 hours. BNP (last 3 results) No results for input(s): PROBNP in the last 8760 hours. HbA1C: No results for input(s): HGBA1C in the last 72 hours. CBG:  Recent Labs Lab 01/02/16 1609 01/02/16 1924 01/02/16 2006 01/02/16 2122 01/03/16 0756  GLUCAP 308* 46* 47* 109* 336*   Lipid Profile: No results for input(s): CHOL, HDL, LDLCALC, TRIG, CHOLHDL, LDLDIRECT in the last 72 hours. Thyroid Function Tests: No results for input(s): TSH, T4TOTAL, FREET4, T3FREE, THYROIDAB in the last 72 hours. Anemia Panel: No results for input(s): VITAMINB12, FOLATE, FERRITIN, TIBC, IRON, RETICCTPCT in the last 72 hours. Sepsis Labs: No results for input(s): PROCALCITON, LATICACIDVEN in the last 168 hours.  No results found for this or any previous visit (from the past 240 hour(s)).       Radiology Studies: Dg Chest Port 1 View  Result Date: 01/01/2016 CLINICAL DATA:  Cardiac catheterization with stent placement, postprocedural. EXAM: PORTABLE CHEST 1 VIEW COMPARISON:  12/23/2015 FINDINGS: Right IJ dialysis  catheter tip:  Right atrium. Prior nasogastric tube is been removed. Prior left IJ line has been removed. The patient is rotated to the right on today's radiograph, reducing diagnostic sensitivity and specificity. Mild to moderate enlargement of the cardiopericardial silhouette, without edema. No pneumothorax. Linear subsegmental atelectasis or scarring at the left lung base, otherwise the lungs appear clear. IMPRESSION: 1.  Mild-to-moderate enlargement of the cardiopericardial silhouette but without current evidence of edema or pulmonary venous hypertension. 2. Right IJ dialysis catheter tip:  Right atrium. Electronically Signed   By: Van Clines M.D.   On: 01/01/2016 19:43        Scheduled Meds: . aspirin  81 mg Oral Daily  . atorvastatin  80 mg Oral q1800  . feeding supplement (NEPRO CARB STEADY)  237 mL Oral Q24H  . fluticasone  1 spray Each Nare Daily  . gabapentin  200 mg Oral TID  . insulin aspart  0-20 Units Subcutaneous TID WC  . insulin aspart  6 Units Subcutaneous TID WC  . insulin glargine  22 Units Subcutaneous BID  . ipratropium-albuterol  3 mL Nebulization BID  . levothyroxine  100 mcg Oral QAC breakfast  . loratadine  5 mg Oral BID  . metoprolol tartrate  12.5 mg Oral BID  . oxymetazoline  1 spray Each Nare BID  . pantoprazole  40 mg Oral BID AC  . predniSONE  10 mg Oral Q breakfast  . rOPINIRole  2 mg Oral Q supper  . sodium chloride flush  3 mL Intravenous Q12H  . sodium chloride flush  3 mL Intravenous Q12H  . ticagrelor  90 mg Oral BID   Continuous Infusions:     LOS: 13 days    Time spent: 25 minutes.     Florencia Reasons, MD PhD [facility] Pager [phone]  If 7PM-7AM, please contact night-coverage [email or web address] Password [email or web address] 01/03/2016, 8:28 AM

## 2016-01-03 NOTE — Progress Notes (Signed)
[facility] Progress Note   Subjective: "I'm doing alright".  No C/O chest pain/no abdominal pain/no nose bleed.  Concerned about plan for DC.   Objective Vitals:   01/02/16 2000 01/02/16 2039 01/02/16 2207 01/03/16 0445  BP: 124/65  124/65 (!) 112/47  Pulse: (!) 101 96 99 92  Resp: (!) 22 18  (!) 21  Temp: 99.6 F (37.6 C)   98.9 F (37.2 C)  TempSrc:      SpO2: 100% 99%  96%  Weight:    128.8 kg (284 lb)  Height:       Physical Exam General: well nourished, NAD Heart:S1,S2, RRR SR on monitor Lungs: BBS CTA A/P Abdomen: obese, non-tender, nondistended Extremities: L BKA no LE edema Dialysis Access: LUA AVF cannulated with 16g needles. RIJ TDC drsg CDI.   Dialysis Orders: MWF AF 4h 65min 2/2.25 bath 127kg Hep 7000 P4 IJ cath/ LUA AVF (have started using AVF at OP center approx 2 wks now, just got down to 15 ga w 2 needles)  No ESA, weekly Fe at center  Assessment: 1. Acute GI Bleed- friable gastric mucosa on EGD w remnants from bleeding. No identifiable source of bleeding found. S/P prbc's (6-8 units total so far). PPI. 2. CP/ NSTEMI: peak trop 18, abnormal stress, had cath w PCI/ DES to mid-diagonal off of LAD. On Brilanta/ ASA.  We have a potential issue, would like to remove his HD cath however with new DES and on antiplatelet agents, not sure if there is a way to accomplish this. Will ask cardiology for their input.   3. ESRD- MWF HD, has cath and using AVF at center and here, 15ga needles. 4. Volume - is close to dry wt, euvolemic on exam 5. ChronicHypotension-on midodrine pre HD 6. Anemia- not on ESA at center, started ESA now darbe 80 mg/ wk; tsat 10% 7. DM2 - on insulin  8. Resp failure/ VDRF - resolved 9. Nose Bleed: ENT consult called per primary. Bleeding areas cauterized.   Plan - HD tomorrow. Otherwise as above.    Kelly Splinter MD [facility] pager [phone]    cell [phone] 01/03/2016, 10:28  AM  Additional Objective Labs: Basic Metabolic Panel:  Recent Labs Lab 01/02/16 0817 01/02/16 1603 01/03/16 0640  NA 131* 131* 129*  K 4.1 3.5 4.1  CL 96* 95* 94*  CO2 21* 25 24  GLUCOSE 171* 301* 352*  BUN 62* 23* 37*  CREATININE 7.77* 4.18* 5.84*  CALCIUM 8.6* 7.8* 7.8*  PHOS 6.9* 3.5 4.1    CBC:  Recent Labs Lab 01/01/16 0514 01/01/16 1955 01/02/16 0050 01/02/16 0513 01/03/16 0640  WBC 8.3 12.3* 11.6* 11.2* 9.3  HGB 9.5* 8.9* 8.3* 8.2* 7.5*  HCT 29.5* 27.5* 26.0* 24.6* 23.5*  MCV 92.8 92.0 92.5 91.4 94.4  PLT 263 277 285 266 248   Blood Culture    Component Value Date/Time   SDES BLOOD LEFT HAND 07/04/2012 2014   SPECREQUEST BOTTLES DRAWN AEROBIC ONLY 4CC 07/04/2012 2014   CULT NO GROWTH 5 DAYS 07/04/2012 2014   REPTSTATUS 07/11/2012 FINAL 07/04/2012 2014    Cardiac Enzymes: No results for input(s): CKTOTAL, CKMB, CKMBINDEX, TROPONINI in the last 168 hours. CBG:  Recent Labs Lab 01/02/16 1609 01/02/16 1924 01/02/16 2006 01/02/16 2122 01/03/16 0756  GLUCAP 308* 46* 47* 109* 336*   Iron Studies: No results for input(s): IRON, TIBC, TRANSFERRIN, FERRITIN in the last 72 hours. @lablastinr3 @ Studies/Results: Dg Chest Port 1 View  Result Date: 01/01/2016  CLINICAL DATA:  Cardiac catheterization with stent placement, postprocedural. EXAM: PORTABLE CHEST 1 VIEW COMPARISON:  12/23/2015 FINDINGS: Right IJ dialysis catheter tip:  Right atrium. Prior nasogastric tube is been removed. Prior left IJ line has been removed. The patient is rotated to the right on today's radiograph, reducing diagnostic sensitivity and specificity. Mild to moderate enlargement of the cardiopericardial silhouette, without edema. No pneumothorax. Linear subsegmental atelectasis or scarring at the left lung base, otherwise the lungs appear clear. IMPRESSION: 1. Mild-to-moderate enlargement of the cardiopericardial silhouette but without current evidence of edema or pulmonary venous  hypertension. 2. Right IJ dialysis catheter tip:  Right atrium. Electronically Signed   By: Van Clines M.D.   On: 01/01/2016 19:43   Medications:   . aspirin  81 mg Oral Daily  . atorvastatin  80 mg Oral q1800  . feeding supplement (NEPRO CARB STEADY)  237 mL Oral Q24H  . fluticasone  1 spray Each Nare Daily  . gabapentin  200 mg Oral TID  . insulin aspart  0-20 Units Subcutaneous TID WC  . insulin aspart  6 Units Subcutaneous TID WC  . insulin glargine  22 Units Subcutaneous BID  . ipratropium-albuterol  3 mL Nebulization BID  . levothyroxine  100 mcg Oral QAC breakfast  . loratadine  5 mg Oral BID  . metoprolol tartrate  12.5 mg Oral BID  . oxymetazoline  1 spray Each Nare BID  . pantoprazole  40 mg Oral BID AC  . predniSONE  10 mg Oral Q breakfast  . rOPINIRole  2 mg Oral Q supper  . sodium chloride flush  3 mL Intravenous Q12H  . sodium chloride flush  3 mL Intravenous Q12H  . ticagrelor  90 mg Oral BID

## 2016-01-03 NOTE — Progress Notes (Signed)
Patient's BS came up to 109 and he looks much better but was requesting some Phenergan for some nausea after eating to get his BS up, will continue to monitor and MD's are aware of situation at this time.

## 2016-01-03 NOTE — Progress Notes (Signed)
BS remains 47 and patient still doesn't look good but is responsive and is able to answer questions appropriately. Text paged the MD and got patient a MAC and Cheese dinner and also put stat orders for Glutase gel since he is able to swallow but patient took one small taste and started to gag, will get 1/2 amp of D50 and give. While ai was in the med room getting patient's D50, Cardiology MD on called me about a text page re: holding his Brillinta in order to pull his Hemodialysis catheter prior to D/C in the AM and made him aware of the situation and what I was doing and he was in agreement. [facility] placed the orders so that I was able to give the meds needed and I gave 1/2 amp of D50 and instructed NT to get a BS in 15 minutes, will continue to monitor and treat accordingly. Patient does state that he is feeling some better at this time.

## 2016-01-04 ENCOUNTER — Encounter (HOSPITAL_COMMUNITY): Payer: Self-pay | Admitting: Cardiovascular Disease

## 2016-01-04 DIAGNOSIS — N186 End stage renal disease: Secondary | ICD-10-CM | POA: Diagnosis not present

## 2016-01-04 LAB — RENAL FUNCTION PANEL
Albumin: 2.6 g/dL — ABNORMAL LOW (ref 3.5–5.0)
Anion gap: 12 (ref 5–15)
BUN: 52 mg/dL — ABNORMAL HIGH (ref 6–20)
CO2: 25 mmol/L (ref 22–32)
Calcium: 8 mg/dL — ABNORMAL LOW (ref 8.9–10.3)
Chloride: 93 mmol/L — ABNORMAL LOW (ref 101–111)
Creatinine, Ser: 7.92 mg/dL — ABNORMAL HIGH (ref 0.61–1.24)
GFR calc Af Amer: 8 mL/min — ABNORMAL LOW (ref >60)
GFR calc non Af Amer: 7 mL/min — ABNORMAL LOW (ref >60)
Glucose, Bld: 318 mg/dL — ABNORMAL HIGH (ref 65–99)
Phosphorus: 5.5 mg/dL — ABNORMAL HIGH (ref 2.5–4.6)
Potassium: 4.5 mmol/L (ref 3.5–5.1)
Sodium: 130 mmol/L — ABNORMAL LOW (ref 135–145)

## 2016-01-04 LAB — GLUCOSE, CAPILLARY
Glucose-Capillary: 140 mg/dL — ABNORMAL HIGH (ref 65–99)
Glucose-Capillary: 207 mg/dL — ABNORMAL HIGH (ref 65–99)
Glucose-Capillary: 275 mg/dL — ABNORMAL HIGH (ref 65–99)
Glucose-Capillary: 384 mg/dL — ABNORMAL HIGH (ref 65–99)
Glucose-Capillary: 64 mg/dL — ABNORMAL LOW (ref 65–99)
Glucose-Capillary: 70 mg/dL (ref 65–99)
Glucose-Capillary: 93 mg/dL (ref 65–99)

## 2016-01-04 LAB — CBC
HCT: 22.6 % — ABNORMAL LOW (ref 39.0–52.0)
Hemoglobin: 7.4 g/dL — ABNORMAL LOW (ref 13.0–17.0)
MCH: 31 pg (ref 26.0–34.0)
MCHC: 32.7 g/dL (ref 30.0–36.0)
MCV: 94.6 fL (ref 78.0–100.0)
Platelets: 218 10*3/uL (ref 150–400)
RBC: 2.39 MIL/uL — ABNORMAL LOW (ref 4.22–5.81)
RDW: 16.8 % — ABNORMAL HIGH (ref 11.5–15.5)
WBC: 9 10*3/uL (ref 4.0–10.5)

## 2016-01-04 LAB — MAGNESIUM: Magnesium: 2.1 mg/dL (ref 1.7–2.4)

## 2016-01-04 MED ORDER — HYDROMORPHONE HCL 1 MG/ML IJ SOLN
INTRAMUSCULAR | Status: AC
  Filled 2016-01-04: qty 1

## 2016-01-04 MED ORDER — OXYMETAZOLINE HCL 0.05 % NA SOLN
1.0000 | Freq: Two times a day (BID) | NASAL | 0 refills | Status: DC
Start: 2016-01-04 — End: 2016-01-19

## 2016-01-04 MED ORDER — NEPRO/CARBSTEADY PO LIQD: 237.0000 mL | ORAL | 0 refills | Status: DC

## 2016-01-04 MED ORDER — TICAGRELOR 90 MG PO TABS: 90.0000 mg | ORAL_TABLET | Freq: Two times a day (BID) | ORAL | 0 refills | Status: DC

## 2016-01-04 MED ORDER — LIDOCAINE-EPINEPHRINE (PF) 1 %-1:200000 IJ SOLN
0.0000 mL | Freq: Once | INTRAMUSCULAR | Status: AC
  Administered 2016-01-04: 30 mL via INTRADERMAL
  Filled 2016-01-04: qty 30

## 2016-01-04 MED ORDER — ATORVASTATIN CALCIUM 80 MG PO TABS: 80.0000 mg | ORAL_TABLET | Freq: Every day | ORAL | 0 refills | Status: DC

## 2016-01-04 MED ORDER — METOPROLOL TARTRATE 25 MG PO TABS: 12.5000 mg | ORAL_TABLET | Freq: Two times a day (BID) | ORAL | 0 refills | Status: DC

## 2016-01-04 NOTE — Discharge Summary (Signed)
Discharge Summary  Dale [name] WFU:[id number] DOB: 17-May-1965  PCP: [name], MD  Admit date: 12/21/2015 Discharge date: 01/04/2016  Time spent: >19mins  Recommendations for Outpatient Follow-up:  1. F/u with PMD within a week  for hospital discharge follow up, repeat cbc/bmp at follow up 2. F/u with cardiology  3. F/u with nephrology, continue outpatient dialysis 4. F/u with GI Dr Harl Bowie 5. F/u with ENT  Discharge Diagnoses:  Active Hospital Problems   Diagnosis Date Noted  . NSTEMI (non-ST elevated myocardial infarction) ([place]) 03/19/2014  . Stridor   . Iron deficiency anemia   . Encounter for central line placement   . Gastrointestinal hemorrhage with melena 12/21/2015  . Encounter for intubation   . Acute blood loss anemia   . Uremia   . Acute encephalopathy   . Troponin level elevated   . HLD (hyperlipidemia) 11/27/2014  . Coronary artery disease involving native coronary artery of native heart with unstable angina pectoris ([place]) 03/20/2014  . HTN (hypertension) 02/18/2014  . S/P BKA (below knee amputation) ([place]) 07/22/2011  . Anemia 07/22/2011    Resolved Hospital Problems   Diagnosis Date Noted Date Resolved  No resolved problems to display.    Discharge Condition: stable  Diet recommendation: heart healthy/carb modified/renal diet  Filed Weights   01/04/16 0512 01/04/16 1225 01/04/16 1638  Weight: 130.2 kg (287 lb) 131.2 kg (289 lb 3.9 oz) 127.7 kg (281 lb 8.4 oz)    History of present illness:   Dale [name] Jris a 50 y.o.malewith PMH as outlined below including ESRD (M/W/F). Per nephrology service, pt is extremely compliant with HD never missing any sessions. On 8/27 he developed L arm pain which he initially thought was due to his "fistula not working". Pain resolved spontaneously therefore he did feel that he needed to be evaluated. The following morning 8/28, when he woke up to go to HD, pain was back and was worse. He  therefore went to ED for further evaluation.  In ED, he was found to have K 7, SCr 12.37, BUN 219, Hgb 6.9.  He was taken for urgent HD and after roughly 1 hour, he began to have diaphoresis, persistent L arm pain which radiated into chest, SOB, worsening anxiety.   PCCM was called and pt was emergently intubated while in HD unit. He was transferred to ICU for further evaluation / management.   Hospital Course:  Principal Problem:   NSTEMI (non-ST elevated myocardial infarction) ([place]) Active Problems:   S/P BKA (below knee amputation) ([place])   Anemia   HTN (hypertension)   Coronary artery disease involving native coronary artery of native heart with unstable angina pectoris ([place])   HLD (hyperlipidemia)   Gastrointestinal hemorrhage with melena   Encounter for intubation   Acute blood loss anemia   Uremia   Acute encephalopathy   Troponin level elevated   Encounter for central line placement   Stridor   Iron deficiency anemia   Anemia - suspect hemorrhagic due to GI bleed PPI oral BID.  Endoscopy 9-01; normal esophagus, erythematous mucosa in the cardia, gastric fundus and gastric body. No source for bleeding identified.  S/p prbc transfusion on 8/28, 9/1, 9/4 Unable to have PRBC on 9/3 due to feeling of throat tightness, after transfusion stop patient improved.  hgb 7.5-7.4 last two days, no overt sign of bleeding, may need prbc transfusion during dialysis prn  ESRD (M/W/F). Hyperkalemia - s/p emergent HD 8/28. Uremia - concern due to GI bleed. Hyponatremia.  AGMA - uremia Improved with Dialysis.  Appreciate nephrology input Vascular surgery has removed old right perm cath on 9/11, patient now use left arm AVF  Acute hypoxic respiratory failure. He was intubated on 8/28 and extubated on 8/30, difficult intubation per critical care Now on room air,,volume to be managed by dialysis  Stridor/horseness: He developed stridor on 9/3, critical care reconsulted, patient  received iv steroids, nebs, no racemic epi needed, ct soft tissue neck no acute findings, does reveal trachoemalacia,  ENT consulted on 9/5, flex laryngoscope consistent with reflux and posterior left cord edema. Outpatient ENT follow up recommended  Hypotension - suspect hemorrhagic due to GI bleed along with component hypovolemia - Received levophed initially in ICU BP improved. Now elevated, he was not on bp meds prior to hospitalization, will continue dialysis for volume control, cardiology started on lopressor.  N-STEMI; Positive troponin;- suspect demand ischemia due to GI bleed. Will consider NSTEMI given chest pain however vs demand ischemia ECHO; EF 65 %  Troponin: -7.9----18----5.4 Cardiology consulted , input appreciated cardiac cath with DES on 9/8.   On DAPT.  Hx HTN, HLD, CAD, dCHF (Echo Nov 2016 with EF 60-65%, G2DD).  Insulin dependent DM;  (on insulin pump at home, he did not use pump while in the hospital)  He is started on insulin drip the evening on 9/5 for elevated blood sugar, he is transitioned to subQ insulin 9/6 morning with taper steroids He is tapered off steroids, blood sugar stable, he is to restart insulin pump.  Acute encephalopathy improved Hx fibromyalgia. Chronic pain.  On gabapentin Started on ropinirole. Alert oriented.  On oral dilaudid at home.  IV dilaudid PRN.    Morbid obesity: Body mass index is 43.18 kg/m. cpap at night  Epistaixs: post cath, topical afrin ,  ENT consulted. Right anterior septal epistaxis cauterized with a silver nitrate stick , ENT input appreciated, Bleeding stopped     DVT prophylaxis while in the hospital: SCD.  Code Status: Full code.  Family Communication: Wife 9-02, 9/8 and 9/11. Disposition Plan:  d/c home with home health    Consultants:   CCM admitted patient on 8/28,  [facility]1  GI  Nephrology   ENT on 9/5 for hoarseness, ENT reconsulted on 9/8 for  epistaxis  Cardiology  Vascular surgery   Procedures: ETT 8/28 >>>8/30 Chronic rt IJ HD Left IJ 8/28 cvl>>>(need access)   Endoscopy 9-01  Flex laryngoscope on 9/5 for hoarseness  and 9/8 for nose bleed  Dialysis mwf  Cardiac cath with DEs on 9/8  Dialysis catheter removal  on 9/11  prbc transfusion on 8/28, 9/1, 9/2, 9/4   Discharge Exam: BP (!) 129/59 (BP Location: Right Arm)   Pulse 84   Temp 98.2 F (36.8 C) (Oral)   Resp (!) 22   Ht 5\' 8"  (1.727 m)   Wt 127.7 kg (281 lb 8.4 oz)   SpO2 100%   BMI 42.81 kg/m   General: NAD, obese Cardiovascular: RRR Respiratory: CTABL Extremity: s/p left kba, less avf dialysis fistula  Discharge Instructions You were cared for by a hospitalist during your hospital stay. If you have any questions about your discharge medications or the care you received while you were in the hospital after you are discharged, you can call the unit and asked to speak with the hospitalist on call if the hospitalist that took care of you is not available. Once you are discharged, your primary care physician will handle any  further medical issues. Please note that NO REFILLS for any discharge medications will be authorized once you are discharged, as it is imperative that you return to your primary care physician (or establish a relationship with a primary care physician if you do not have one) for your aftercare needs so that they can reassess your need for medications and monitor your lab values.  Discharge Instructions    Amb Referral to Cardiac Rehabilitation    Complete by:  As directed   [facility] Cardiac Rehab outpatient   Diagnosis:   NSTEMI Coronary Stents     Diet - low sodium heart healthy    Complete by:  As directed   Low fat, carb modified, renal diet.   Face-to-face encounter (required for Medicare/Medicaid patients)    Complete by:  As directed   I Kimmerly Lora certify that this patient is under my care and that I, or a  nurse practitioner or physician's assistant working with me, had a face-to-face encounter that meets the physician face-to-face encounter requirements with this patient on 01/04/2016. The encounter with the patient was in whole, or in part for the following medical condition(s) which is the primary reason for home health care (List medical condition): FTT   The encounter with the patient was in whole, or in part, for the following medical condition, which is the primary reason for home health care:  FTT   I certify that, based on my findings, the following services are medically necessary home health services:  Physical therapy   Reason for Medically Necessary Home Health Services:  Therapy- Personnel officer, Public librarian   My clinical findings support the need for the above services:  Unsafe ambulation due to balance issues   Further, I certify that my clinical findings support that this patient is homebound due to:  Unsafe ambulation due to balance issues   Home Health    Complete by:  As directed   To provide the following care/treatments:  PT   Increase activity slowly    Complete by:  As directed       Medication List    TAKE these medications   acetaminophen 500 MG tablet Commonly known as:  TYLENOL Take 1,000 mg by mouth every 6 (six) hours as needed for moderate pain or headache.   allopurinol 100 MG tablet Commonly known as:  ZYLOPRIM Take 200 mg by mouth daily.   aspirin EC 81 MG tablet Take 81 mg by mouth at bedtime.   atorvastatin 80 MG tablet Commonly known as:  LIPITOR Take 1 tablet (80 mg total) by mouth daily at 6 PM. What changed:  medication strength  how much to take  when to take this   AURYXIA 1 GM 210 MG(Fe) Tabs Generic drug:  Ferric Citrate Take 2 g by mouth 3 (three) times daily with meals.   calcium acetate 667 MG capsule Commonly known as:  PHOSLO Take 1,334-2,001 mg by mouth See admin instructions. Take 3 capsules (2001 mg) by  mouth with meals and 2 capsule (1334mg  ) with snacks   diphenhydrAMINE 25 mg capsule Commonly known as:  BENADRYL Take 1-2 capsules (25-50 mg total) by mouth every 6 (six) hours as needed for itching. What changed:  how much to take  when to take this  reasons to take this   feeding supplement (NEPRO CARB STEADY) Liqd Take 237 mLs by mouth daily.   fenofibrate 48 MG tablet Commonly known as:  TRICOR Take 1 tablet (  48 mg total) by mouth daily.   gabapentin 300 MG capsule Commonly known as:  NEURONTIN Take 300 mg by mouth 3 (three) times daily.   GLUCAGON EMERGENCY 1 MG injection Generic drug:  glucagon Inject 1 mg into the muscle daily as needed (low blood sugar).   HYDROmorphone 2 MG tablet Commonly known as:  DILAUDID Take 1 tablet (2 mg total) by mouth 3 (three) times daily as needed for severe pain. What changed:  when to take this  additional instructions  Another medication with the same name was removed. Continue taking this medication, and follow the directions you see here.   ibuprofen 200 MG tablet Commonly known as:  ADVIL,MOTRIN Take 400 mg by mouth 2 (two) times daily as needed for moderate pain.   insulin lispro 100 UNIT/ML injection Commonly known as:  HUMALOG Inject 175 Units into the skin continuous. Up to 175 units continuous in pump,  Basal rates area as follows:  2400 1.5 units/hr, 0300 1.9 units/hr, 0800 1.45 units/hr, 1200 1.625 units/hr   insulin pump Soln Inject 1 each into the skin continuous. Humalog   levothyroxine 100 MCG tablet Commonly known as:  SYNTHROID, LEVOTHROID Take 100 mcg by mouth at bedtime.   loratadine 10 MG tablet Commonly known as:  CLARITIN Take 10 mg by mouth daily.   loratadine-pseudoephedrine 5-120 MG tablet Commonly known as:  CLARITIN-D 12-hour Take 1 tablet by mouth 2 (two) times daily.   metoCLOPramide 5 MG tablet Commonly known as:  REGLAN Take 5 mg by mouth 4 (four) times daily - after meals and at  bedtime.   metoprolol tartrate 25 MG tablet Commonly known as:  LOPRESSOR Take 0.5 tablets (12.5 mg total) by mouth 2 (two) times daily.   midodrine 10 MG tablet Commonly known as:  PROAMATINE Take 20 mg by mouth every Monday, Wednesday, and Friday. On dialysis days   multivitamin Tabs tablet Take 1 tablet by mouth daily.   nitroGLYCERIN 0.4 MG SL tablet Commonly known as:  NITROSTAT Place 0.4 mg under the tongue every 5 (five) minutes as needed for chest pain.   omeprazole 20 MG capsule Commonly known as:  PRILOSEC Take 20 mg by mouth daily.   oxymetazoline 0.05 % nasal spray Commonly known as:  AFRIN Place 1 spray into both nostrils 2 (two) times daily.   promethazine 25 MG tablet Commonly known as:  PHENERGAN Take 1 tablet (25 mg total) by mouth every 6 (six) hours as needed for nausea or vomiting. For nausea   rOPINIRole 2 MG tablet Commonly known as:  REQUIP Take 2 mg by mouth daily with supper.   ticagrelor 90 MG Tabs tablet Commonly known as:  BRILINTA Take 1 tablet (90 mg total) by mouth 2 (two) times daily. Please contact cardiology Dr  Camillia Herter office for prior authorization needs or refills.      Allergies  Allergen Reactions  . Contrast Media [Iodinated Diagnostic Agents] Rash and Itching    Systemic itching and transient rash appearance within hours of receiving contrast  Systemic itching and transient rash appearance within hours of receiving contrast  Redman Syndrome  . Iodides Rash  . Ioxaglate Rash  . Rocephin [Ceftriaxone] Itching  . Amoxicillin Itching and Rash    .Has patient had a PCN reaction causing immediate rash, facial/tongue/throat swelling, SOB or lightheadedness with hypotension: Yes Has patient had a PCN reaction causing severe rash involving mucus membranes or skin necrosis: No Has patient had a PCN reaction that required hospitalization No Has  patient had a PCN reaction occurring within the last 10 years: No If all of the above  answers are "NO", then may proceed with Cephalosporin use. Has patient had a PCN reaction causing immediate rash, facial/tongue/throat swelling, SOB or lightheadedness with hypotension: Yes Has patient had a PCN reaction causing severe rash involving mucus membranes or skin necrosis: No Has patient had a PCN reaction that required hospitalization No Has patient had a PCN reaction occurring within the last 10 years: No If all of the above answers are "NO", then may proceed with Cephalosporin use.  . Buprenorphine Hcl Nausea Only and Hives  . Clopidogrel Rash  . Enalapril Rash    cough  . Hydrocodone Rash  . Morphine Nausea Only, Rash and Hives  . Morphine And Related Hives and Nausea Only  . Adhesive [Tape] Other (See Comments) and Itching    Blisters on arm from adhesive tape at dialysis Blisters on arm from adhesive tape at dialysis  . Ciprofloxacin Itching and Rash  . Clindamycin/Lincomycin Hives and Itching  . Codeine Hives, Itching and Rash  . Doxycycline Rash  . Enalaprilat     Other reaction(s): Cough (ALLERGY/intolerance)  . Levofloxacin Hives and Itching  . Lincomycin Hcl Itching and Hives  . Mobic [Meloxicam] Hives and Rash  . Oxycodone Rash  . Plavix [Clopidogrel Bisulfate] Rash  . Vasotec Cough   Follow-up Information    [facility] .   Why:  Registered Nurse, Physical Therapy Contact information: [place] [address] [phone]        Lauree Chandler, MD .   Specialty:  Cardiology Why:  The office will contact you to arrange Cardiology hospital follow-up. If you do not hear from them in 3 business days, please contact the number provided.  Contact information: [address] [place] [address] [phone]        ASRES,ALEHEGN, MD Follow up in 1 week(s).   Specialty:  Family Medicine Why:  hospital discharge follow up Contact information: [address] [place]  [address] [phone]        continue hemodialysis and follow up with nephrology .        Harl Bowie, MD Follow up in 1 month(s).   Specialty:  Gastroenterology Why:  gi bleed Contact information: [place] [address] [phone]        Izora Gala, MD Follow up in 1 month(s).   Specialty:  Otolaryngology Why:  as needed for nose bleed. Contact information: [address] [place] [address] [phone]            The results of significant diagnostics from this hospitalization (including imaging, microbiology, ancillary and laboratory) are listed below for reference.    Significant Diagnostic Studies: Ct Soft Tissue Neck Wo Contrast  Result Date: 12/27/2015 CLINICAL DATA:  50 year old male intubated a few days ago, sensation of throat closing up. Stridor. Initial encounter. EXAM: CT NECK WITHOUT CONTRAST TECHNIQUE: Multidetector CT imaging of the neck was performed following the standard protocol without intravenous contrast. COMPARISON:  Chest CTA 10/02/2014. FINDINGS: Large body habitus. Pharynx and larynx: The vocal cords are in close apposition (series 2, image 85) but this appears similar to the appearance on the 10/02/2014 chest CTA (series 5, image 5 of that exam). The supraglottic larynx and epiglottis are normal. The subglottic trachea does appear diminutive, but again similar to the prior chest CTA. Compare the Highly atelectatic  appearance of the trachea on series 2, image 111 today to series 5, image 33 on the prior chest CTA. 7 Pharyngeal contours are within normal limits. Negative parapharyngeal and retropharyngeal spaces. Salivary glands: Negative sublingual space, submandibular glands and parotid glands. Thyroid: Diminutive common normal. Lymph nodes: Negative.  No cervical lymphadenopathy. Vascular: Right IJ approach dual lumen catheter partially visualized. Calcified distal cervical ICA atherosclerosis. Calcified ICA  siphons and distal vertebral arteries. Vasculature not otherwise evaluated in the absence of IV contrast. Limited intracranial: Negative. Visualized orbits: Postoperative changes to both globes. Mastoids and visualized paranasal sinuses: Small mucous retention cyst right sphenoid sinus. Other paranasal sinuses are clear. Partial opacification of both tympanic cavities and mastoid air cells. The ossicles appear to remain intact. The external auditory canals appear fairly normal. Skeleton:  No acute osseous abnormality identified. Upper chest: Stable visible lung apices. Partially visible chronic mediastinal lipomatosis. IMPRESSION: 1. Tracheomalacia. Highly atelectatic appearance of the trachea today, but not significantly changed from a June Chest CTA (see series 2, image 111 today and series 5, image 33 on the 10/02/2014 chest CTA). 2. Likewise, the glottis appears stable since June. The supraglottic larynx is normal. No laryngeal trauma suspected. 3. Calcified atherosclerosis of the distal ICAs and vertebral arteries. Electronically Signed   By: Genevie Ann M.D.   On: 12/27/2015 18:46   Nm Myocar Multi W/spect W/wall Motion / Ef  Result Date: 12/29/2015  There was no ST segment deviation noted during stress.  No T wave inversion was noted during stress.  Defect 1: There is a medium defect of moderate severity present in the mid anterior, mid anterolateral, apical anterior and apical lateral location.  Findings consistent with apical anterolateral ischemia and prior apical anterolateral myocardial infarction.  This is an intermediate risk study.  The left ventricular ejection fraction is normal (55-65%).  Nuclear stress EF: 64%.    Dg Chest Port 1 View  Result Date: 01/01/2016 CLINICAL DATA:  Cardiac catheterization with stent placement, postprocedural. EXAM: PORTABLE CHEST 1 VIEW COMPARISON:  12/23/2015 FINDINGS: Right IJ dialysis catheter tip:  Right atrium. Prior nasogastric tube is been removed. Prior  left IJ line has been removed. The patient is rotated to the right on today's radiograph, reducing diagnostic sensitivity and specificity. Mild to moderate enlargement of the cardiopericardial silhouette, without edema. No pneumothorax. Linear subsegmental atelectasis or scarring at the left lung base, otherwise the lungs appear clear. IMPRESSION: 1. Mild-to-moderate enlargement of the cardiopericardial silhouette but without current evidence of edema or pulmonary venous hypertension. 2. Right IJ dialysis catheter tip:  Right atrium. Electronically Signed   By: Van Clines M.D.   On: 01/01/2016 19:43   Dg Chest Port 1 View  Result Date: 12/23/2015 CLINICAL DATA:  Respiratory failure. EXAM: PORTABLE CHEST 1 VIEW COMPARISON:  12/22/2015. FINDINGS: Endotracheal tube, NG tube, left eye J line, large caliber right IJ line in stable position. Cardiomegaly with mild pulmonary venous congestion and interstitial prominence suggesting mild congestive heart failure. Small left pleural effusion. Chest is unchanged from prior exam. No pneumothorax . IMPRESSION: 1. Lines and tubes in stable position. 2. Persistent changes of mild congestive heart failure with cardiomegaly, mild pulmonary venous congestion, mild interstitial edema and small left pleural effusion. No interim change from prior exam. Electronically Signed   By: Marcello Moores  Register   On: 12/23/2015 06:58   Dg Chest Port 1 View  Result Date: 12/22/2015 CLINICAL DATA:  Respiratory failure.  Assess endotracheal tube. EXAM: PORTABLE CHEST 1 VIEW COMPARISON:  Chest  radiograph 12/21/2015 FINDINGS: Support lines and tubes are in unchanged position. The tip of the endotracheal tube remains at the level of the clavicular heads. Cardiomediastinal contours are unchanged. Persistent pulmonary vascular congestion. Small left pleural effusion. IMPRESSION: 1. Unchanged support lines and tubes. 2. Mild pulmonary edema. Electronically Signed   By: Ulyses Jarred M.D.   On:  12/22/2015 06:41   Dg Chest Port 1 View  Result Date: 12/21/2015 CLINICAL DATA:  Status post central line placement. EXAM: PORTABLE CHEST 1 VIEW COMPARISON:  Single-view of the chest earlier today. FINDINGS: Endotracheal tube is again seen with the tip at the top of the clavicular heads, well above the carina. Right IJ approach dialysis catheter remains in place. A new left IJ central venous catheter is seen with the tip projecting over the mid superior vena cava. No pneumothorax. There is cardiomegaly without edema. IMPRESSION: New left IJ catheter tip projects in the mid superior vena cava. Negative for pneumothorax. No other change. Electronically Signed   By: Inge Rise M.D.   On: 12/21/2015 15:07   Dg Chest Port 1 View  Result Date: 12/21/2015 CLINICAL DATA:  Intubated EXAM: PORTABLE CHEST 1 VIEW COMPARISON:  Chest radiograph from earlier today. FINDINGS: Endotracheal tube tip is 3.8 cm above the carina. Right internal jugular central venous catheter terminates at the cavoatrial junction. Enteric tube enters stomach with the tip not seen on this image. Stable cardiomediastinal silhouette with cardiomegaly. No pneumothorax. Stable probable trace bilateral pleural effusions. Mild pulmonary edema, not definitely changed. Patchy left lung base opacity appears increased. IMPRESSION: 1. Well-positioned support structures as described. 2. Stable mild congestive heart failure . 3. Increased patchy left lung base opacity, favor atelectasis, cannot exclude a component of pneumonia or aspiration. Electronically Signed   By: Ilona Sorrel M.D.   On: 12/21/2015 11:26   Dg Chest Port 1 View  Result Date: 12/21/2015 CLINICAL DATA:  LEFT chest and arm pain for 2 days. History of diabetes, renal failure, CHF. EXAM: PORTABLE CHEST 1 VIEW COMPARISON:  Chest radiograph March 09, 2015 FINDINGS: Cardiac silhouette is similarly enlarged. Mediastinal silhouette is nonsuspicious, tunneled dialysis catheter via RIGHT  internal jugular venous approach with distal tips projecting cavoatrial junction. Strandy densities LEFT lung base. Pulmonary vasculature appears normal, no pleural effusion or focal consolidation. No pneumothorax. Large body habitus. Osseous structures are nonsuspicious. IMPRESSION: Similar cardiomegaly, LEFT lung base atelectasis. Electronically Signed   By: Elon Alas M.D.   On: 12/21/2015 06:49    Microbiology: No results found for this or any previous visit (from the past 240 hour(s)).   Labs: Basic Metabolic Panel:  Recent Labs Lab 12/30/15 0413  01/01/16 8250 01/01/16 1538  01/02/16 0370 01/02/16 4888 01/02/16 1603 01/03/16 0640 01/04/16 0331  NA  --   < > 134* 128*  --  131* 131* 131* 129* 130*  K  --   < > 3.7 5.5*  < > 4.5 4.1 3.5 4.1 4.5  CL  --   < > 98* 95*  --  96* 96* 95* 94* 93*  CO2  --   < > 26 19*  --  21* 21* 25 24 25   GLUCOSE  --   < > 135* 305*  --  219* 171* 301* 352* 318*  BUN  --   < > 32* 44*  --  61* 62* 23* 37* 52*  CREATININE  --   < > 5.32* 6.31*  --  7.61* 7.77* 4.18* 5.84* 7.92*  CALCIUM  --   < >  8.4* 8.3*  --  8.6* 8.6* 7.8* 7.8* 8.0*  MG 2.2  --  2.0  --   --  2.2  --   --  1.9 2.1  PHOS  --   < > 4.5 6.3*  --   --  6.9* 3.5 4.1 5.5*  < > = values in this interval not displayed. Liver Function Tests:  Recent Labs Lab 01/02/16 0513 01/02/16 0817 01/02/16 1603 01/03/16 0640 01/04/16 0331  AST 19  --   --   --   --   ALT 23  --   --   --   --   ALKPHOS 54  --   --   --   --   BILITOT 0.5  --   --   --   --   PROT 5.6*  --   --   --   --   ALBUMIN 2.5* 2.4* 2.7* 2.7* 2.6*   No results for input(s): LIPASE, AMYLASE in the last 168 hours. No results for input(s): AMMONIA in the last 168 hours. CBC:  Recent Labs Lab 01/01/16 1955 01/02/16 0050 01/02/16 0513 01/03/16 0640 01/04/16 0331  WBC 12.3* 11.6* 11.2* 9.3 9.0  HGB 8.9* 8.3* 8.2* 7.5* 7.4*  HCT 27.5* 26.0* 24.6* 23.5* 22.6*  MCV 92.0 92.5 91.4 94.4 94.6  PLT 277 285  266 248 218   Cardiac Enzymes: No results for input(s): CKTOTAL, CKMB, CKMBINDEX, TROPONINI in the last 168 hours. BNP: BNP (last 3 results)  Recent Labs  02/25/15 0003  BNP 444.3*    ProBNP (last 3 results) No results for input(s): PROBNP in the last 8760 hours.  CBG:  Recent Labs Lab 01/04/16 1122 01/04/16 1432 01/04/16 1501 01/04/16 1545 01/04/16 1729  GLUCAP 207* 70 64* 93 140*       Signed:  Sharece Fleischhacker MD, PhD  [facility] 01/04/2016, 6:08 PM

## 2016-01-04 NOTE — Progress Notes (Addendum)
[facility] H&P  CC:  Catheter removal   HPI:  This is a 50 y.o. male who presents for right IJ tunneled dialysis catheter removal.  He has a functioning left upper arm fistula. He is on DAPT (ASA and Brilinta) due to recent DES on 01/01/16 for NSTEMI.    Past Medical History:  Diagnosis Date  . Anemia of chronic disease   . Arthritis    "hands, right knee" (03/19/2014)  . CAD (coronary artery disease)    a. Lexiscan Myoview (11/15):  Inf, apical cap and apical lateral ischemia, EF 56%, inf HK, Moderate Risk;  b. LHC (11/15):  mid to dist Dx 80%, mid RCA 99% (functional CTO), dist RCA 80% >> PCI: balloon angioplasty to mid RCA (could not deliver stent) - plan staged PCI with HSRA of RCA; Dx to be tx medically >> PCI: Rotoblator atherectomy/DES to [facility]  . Cholecystitis    a. 08/27/2011  . Chronic diastolic CHF (congestive heart failure) ([place])    a. Echo 3/13:  EF 55-60%;  b. Echo (11/15):  Mild LVH, EF 60-65%, no RWMA, Gr 1 DD, MAC, mild LAE, normal RVF, mild RAE;  c.  Echo 5/16:  severe LVH, EF 55-60%, no RWMA, Gr 1 DD, MAC, mild LAE  . Claustrophobia    when things get around his face.   . Difficult intubation    only once in 2015 at [facility] haven't had a problem since then  . ESRD (end stage renal disease) ([place])    a. 1995 s/p cadaveric transplant w/ susbequent failure after 18 yrs;  b.Dialysis initiated 07/2011 [facility] M-W-F  . Fibromyalgia   . GERD (gastroesophageal reflux disease)   . Gout    PMH  . History of blood transfusion   . HLD (hyperlipidemia)   . Hypertension   . Hypothyroidism   . Inguinal lymphadenopathy    a. bilateral - s/p biopsy 07/2011  . Insulin dependent diabetes mellitus (HCC)    Type I  . OSA (obstructive sleep apnea)    adjustable bed  . Pericardial effusion    a.  Small by CT 08/22/11;  b.  Large by CT 08/27/11  . Pneumonia ~ 2007    FH:  Non-Contributory  Social History   Social History  . Marital status: Married   Spouse name: N/A  . Number of children: N/A  . Years of education: N/A   Occupational History  . Disabled    Social History Main Topics  . Smoking status: Never Smoker  . Smokeless tobacco: Never Used  . Alcohol use Yes  . Drug use: No  . Sexual activity: Not on file   Other Topics Concern  . Not on file   Social History Narrative   Lives @ home with his wife in [place].     Allergies  Allergen Reactions  . Contrast Media [Iodinated Diagnostic Agents] Rash and Itching    Systemic itching and transient rash appearance within hours of receiving contrast  Systemic itching and transient rash appearance within hours of receiving contrast  Redman Syndrome  . Iodides Rash  . Ioxaglate Rash  . Rocephin [Ceftriaxone] Itching  . Amoxicillin Itching and Rash    .Has patient had a PCN reaction causing immediate rash, facial/tongue/throat swelling, SOB or lightheadedness with hypotension: Yes Has patient had a PCN reaction causing severe rash involving mucus membranes or skin necrosis: No Has patient had a PCN reaction that required hospitalization No Has patient had a PCN  reaction occurring within the last 10 years: No If all of the above answers are "NO", then may proceed with Cephalosporin use. Has patient had a PCN reaction causing immediate rash, facial/tongue/throat swelling, SOB or lightheadedness with hypotension: Yes Has patient had a PCN reaction causing severe rash involving mucus membranes or skin necrosis: No Has patient had a PCN reaction that required hospitalization No Has patient had a PCN reaction occurring within the last 10 years: No If all of the above answers are "NO", then may proceed with Cephalosporin use.  . Buprenorphine Hcl Nausea Only and Hives  . Clopidogrel Rash  . Enalapril Rash    cough  . Hydrocodone Rash  . Morphine Nausea Only, Rash and Hives  . Morphine And Related Hives and Nausea Only  . Adhesive [Tape] Other (See Comments) and Itching     Blisters on arm from adhesive tape at dialysis Blisters on arm from adhesive tape at dialysis  . Ciprofloxacin Itching and Rash  . Clindamycin/Lincomycin Hives and Itching  . Codeine Hives, Itching and Rash  . Doxycycline Rash  . Enalaprilat     Other reaction(s): Cough (ALLERGY/intolerance)  . Levofloxacin Hives and Itching  . Lincomycin Hcl Itching and Hives  . Mobic [Meloxicam] Hives and Rash  . Oxycodone Rash  . Plavix [Clopidogrel Bisulfate] Rash  . Vasotec Cough    Current Facility-Administered Medications  Medication Dose Route Frequency Provider Last Rate Last Dose  . 0.9 %  sodium chloride infusion  250 mL Intravenous PRN Troy Sine, MD      . 0.9 %  sodium chloride infusion  250 mL Intravenous PRN Troy Sine, MD      . acetaminophen (TYLENOL) tablet 650 mg  650 mg Oral Q4H PRN Troy Sine, MD      . alum & mag hydroxide-simeth (MAALOX/MYLANTA) 200-200-20 MG/5ML suspension 30 mL  30 mL Oral Q4H PRN Burnell Blanks, MD   30 mL at 01/03/16 2133  . aspirin chewable tablet 81 mg  81 mg Oral Daily Troy Sine, MD   81 mg at 01/03/16 0841  . atorvastatin (LIPITOR) tablet 80 mg  80 mg Oral q1800 Troy Sine, MD   80 mg at 01/03/16 1744  . diphenhydrAMINE (BENADRYL) 12.5 MG/5ML elixir 25 mg  25 mg Oral Q8H PRN Belkys A Regalado, MD   25 mg at 01/01/16 2323  . feeding supplement (NEPRO CARB STEADY) liquid 237 mL  237 mL Oral Q24H Belkys A Regalado, MD   237 mL at 01/02/16 2000  . fluticasone (FLONASE) 50 MCG/ACT nasal spray 1 spray  1 spray Each Nare Daily Belkys A Regalado, MD   1 spray at 01/01/16 0812  . gabapentin (NEURONTIN) capsule 200 mg  200 mg Oral TID Belkys A Regalado, MD   200 mg at 01/03/16 2131  . hydrALAZINE (APRESOLINE) injection 10 mg  10 mg Intravenous Q6H PRN Florencia Reasons, MD   10 mg at 01/01/16 1459  . HYDROmorphone (DILAUDID) injection 0.5 mg  0.5 mg Intravenous Q4H PRN Belkys A Regalado, MD   0.5 mg at 01/04/16 0923  . HYDROmorphone (DILAUDID)  tablet 1 mg  1 mg Oral Q8H PRN Belkys A Regalado, MD   1 mg at 01/01/16 1349  . insulin aspart (novoLOG) injection 0-20 Units  0-20 Units Subcutaneous TID WC Florencia Reasons, MD   11 Units at 01/04/16 0817  . insulin aspart (novoLOG) injection 6 Units  6 Units Subcutaneous TID WC Florencia Reasons, MD  6 Units at 01/04/16 0820  . insulin glargine (LANTUS) injection 22 Units  22 Units Subcutaneous BID Florencia Reasons, MD   22 Units at 01/03/16 2133  . ipratropium-albuterol (DUONEB) 0.5-2.5 (3) MG/3ML nebulizer solution 3 mL  3 mL Nebulization Q6H PRN Florencia Reasons, MD   3 mL at 01/03/16 0502  . ipratropium-albuterol (DUONEB) 0.5-2.5 (3) MG/3ML nebulizer solution 3 mL  3 mL Nebulization BID Estanislado Emms, MD   3 mL at 01/04/16 9323  . levothyroxine (SYNTHROID, LEVOTHROID) tablet 100 mcg  100 mcg Oral QAC breakfast Raylene Miyamoto, MD   100 mcg at 01/04/16 5573  . lidocaine (XYLOCAINE) 2 % jelly 1 application  1 application Topical Once PRN Izora Gala, MD      . lidocaine-EPINEPHrine (XYLOCAINE-EPINEPHrine) 1 %-1:200000 (PF) injection 0-30 mL  0-30 mL Intradermal Once PRN Izora Gala, MD      . lidocaine-EPINEPHrine (XYLOCAINE-EPINEPHrine) 1 %-1:200000 (PF) injection 0-30 mL  0-30 mL Intradermal Once Alvia Grove, PA-C      . loratadine (CLARITIN) tablet 5 mg  5 mg Oral BID Raylene Miyamoto, MD   5 mg at 01/03/16 2131  . menthol-cetylpyridinium (CEPACOL) lozenge 3 mg  1 lozenge Oral PRN Rhetta Mura Schorr, NP      . metoprolol tartrate (LOPRESSOR) tablet 12.5 mg  12.5 mg Oral BID Florencia Reasons, MD   12.5 mg at 01/03/16 2131  . ondansetron (ZOFRAN) injection 4 mg  4 mg Intravenous Q6H PRN Troy Sine, MD      . oxymetazoline (AFRIN) 0.05 % nasal spray 1 spray  1 spray Each Nare BID Florencia Reasons, MD   1 spray at 01/02/16 2200  . oxymetazoline (AFRIN) 0.05 % nasal spray 1 spray  1 spray Each Nare Once PRN Izora Gala, MD      . pantoprazole (PROTONIX) EC tablet 40 mg  40 mg Oral BID AC Tanda Rockers, MD   40 mg at 01/04/16 2202  .  phenol (CHLORASEPTIC) mouth spray 1 spray  1 spray Mouth/Throat PRN Chesley Mires, MD   1 spray at 12/24/15 2140  . predniSONE (DELTASONE) tablet 5 mg  5 mg Oral Q breakfast Florencia Reasons, MD   5 mg at 01/04/16 0820  . promethazine (PHENERGAN) tablet 12.5 mg  12.5 mg Oral Q8H PRN Raylene Miyamoto, MD   12.5 mg at 01/02/16 2208   Or  . promethazine (PHENERGAN) injection 12.5 mg  12.5 mg Intravenous Q8H PRN Raylene Miyamoto, MD   12.5 mg at 12/25/15 2140  . rOPINIRole (REQUIP) tablet 2 mg  2 mg Oral Q supper Raylene Miyamoto, MD   2 mg at 01/03/16 1744  . silver nitrate applicators applicator 10 Stick  10 Stick Topical Once PRN Izora Gala, MD      . sodium chloride (OCEAN) 0.65 % nasal spray 2 spray  2 spray Each Nare Q1H PRN Izora Gala, MD      . sodium chloride flush (NS) 0.9 % injection 3 mL  3 mL Intravenous Q12H Troy Sine, MD   3 mL at 01/02/16 2200  . sodium chloride flush (NS) 0.9 % injection 3 mL  3 mL Intravenous PRN Troy Sine, MD      . sodium chloride flush (NS) 0.9 % injection 3 mL  3 mL Intravenous Q12H Troy Sine, MD   3 mL at 01/01/16 2052  . sodium chloride flush (NS) 0.9 % injection 3 mL  3 mL Intravenous PRN Marcello Moores  Floyce Stakes, MD      . ticagrelor [facility]) tablet 90 mg  90 mg Oral BID Troy Sine, MD   90 mg at 01/03/16 2131  . TRIPLE ANTIBIOTIC [phone] OINT 1 application  1 application Apply externally Once PRN Izora Gala, MD       Facility-Administered Medications Ordered in Other Encounters  Medication Dose Route Frequency Provider Last Rate Last Dose  . 0.9 %  sodium chloride infusion   Intravenous Continuous Angelia Mould, MD      . Chlorhexidine Gluconate Cloth 2 % PADS 6 each  6 each Topical Once Angelia Mould, MD        ROS:  See HPI  PHYSICAL EXAM  Vitals:   01/04/16 0512 01/04/16 0812  BP: (!) 123/33 (!) 123/35  Pulse: 84 87  Resp: (!) 24 20  Temp: 97.9 F (36.6 C) 98.3 F (36.8 C)    Gen:  Well developed well  nourished HEENT:  normocephalic Neck:  Right IJ TDC Lungs:  Non-labored Extremities:  Left BKA Left groin ecchymosis. No significant hematoma.  Skin:  No obvious rashes Neuro:  No focal deficits.   Impression: This is a 50 y.o. male here for right IJ tunneled dialysis catheter removal  Plan:  Removal of right IJ tunneled dialysis catheter. Per cardiology recommendations, will remove TDC on Brilinta.   Virgina Jock, PA-C [facility] [phone] 01/04/2016 9:45 AM

## 2016-01-04 NOTE — Progress Notes (Signed)
Physical Therapy Treatment Patient Details Name: Dale [name] MRN: [id number] DOB: 1965-10-01 Today's Date: 01/04/2016    History of Present Illness Patient is a 50 yo male admitted 12/21/15 with anemia, poss GIB, Hyperkalemia.  Patient less responsive and intubated 12/21/15.  Patient with anemia, acute encephalopathy, VDRF.  Extubated 12/23/15.      PMH:  ESRD on HD, morbid obesity, Lt BKA    PT Comments    Pt progressing well with mobility, he ambulated 73' with RW, distance limited by 3/4 dyspnea, SaO2 100% on RA walking, HR 112.   Follow Up Recommendations  Home health PT;Supervision for mobility/OOB     Equipment Recommendations  None recommended by PT    Recommendations for Other Services       Precautions / Restrictions Precautions Precautions: Fall Required Braces or Orthoses: Other Brace/Splint Other Brace/Splint: Lt BKA prosthesis Restrictions LLE Weight Bearing: Weight bearing as tolerated    Mobility  Bed Mobility               General bed mobility comments: Sitting EOB upon arrival  Transfers Overall transfer level: Needs assistance Equipment used: Rolling walker (2 wheeled)   Sit to Stand: Supervision         General transfer comment: pt with 2/4 DOE after donning prosthesis and shoe, increased effort for sit to stand with dyspnea  Ambulation/Gait Ambulation/Gait assistance: Min guard Ambulation Distance (Feet): 80 Feet Assistive device: Rolling walker (2 wheeled) Gait Pattern/deviations: Step-through pattern;Wide base of support Gait velocity: decreased Gait velocity interpretation: Below normal speed for age/gender General Gait Details: SaO2 100% on RA with walking, 3/4 dyspnea walking, HR 112, no LOB, crepitus noted R knee, pt stated he's "bone on bone" R knee and plans to have a procedure to "burn the nerves to the knee" (ambulated on 2 liters supplemental O2)   Stairs            Wheelchair Mobility    Modified Rankin (Stroke  Patients Only)       Balance     Sitting balance-Leahy Scale: Good Sitting balance - Comments: pt able to don prosthesis EOB with no LOB     Standing balance-Leahy Scale: Poor Standing balance comment: relies on BUE support                    Cognition Arousal/Alertness: Awake/alert Behavior During Therapy: WFL for tasks assessed/performed Overall Cognitive Status: Within Functional Limits for tasks assessed                      Exercises      General Comments        Pertinent Vitals/Pain Pain Assessment: No/denies pain    Home Living                      Prior Function            PT Goals (current goals can now be found in the care plan section) Acute Rehab PT Goals Patient Stated Goal: to get stronger PT Goal Formulation: With patient Time For Goal Achievement: 01/11/16 Potential to Achieve Goals: Good Progress towards PT goals: Progressing toward goals    Frequency  Min 3X/week    PT Plan Current plan remains appropriate    Co-evaluation             End of Session Equipment Utilized During Treatment: Gait belt Activity Tolerance: Patient tolerated treatment well;Patient limited by fatigue (hard stress  test today) Patient left: in bed;with call bell/phone within reach (sitting EOB)     Time: 3779-3968 PT Time Calculation (min) (ACUTE ONLY): 14 min  Charges:  $Gait Training: 8-22 mins                    G Codes:      Philomena Doheny 01/04/2016, 12:39 PM [phone]

## 2016-01-04 NOTE — Progress Notes (Signed)
Inpatient Diabetes Program Recommendations  AACE/ADA: New Consensus Statement on Inpatient Glycemic Control (2015)  Target Ranges:  Prepandial:   less than 140 mg/dL      Peak postprandial:   less than 180 mg/dL (1-2 hours)      Critically ill patients:  140 - 180 mg/dL   Lab Results  Component Value Date   GLUCAP 275 (H) 01/04/2016   HGBA1C 6.3 (H) 12/30/2015    Spoke with patient today about possibly restarting his pump today before he goes home. Patient reports he does not have his insulin pump here and when his wife comes she will take him straight home where he will restart his pump there. Patient is very familiar with insulin pump and knows how to reduce basal rates if needed at home.  Thanks,  Tama Headings RN, MSN, [facility] Inpatient Diabetes Coordinator Team Pager [phone] (8a-5p)

## 2016-01-04 NOTE — Progress Notes (Signed)
PT Cancellation Note  Patient Details Name: Dale [name] MRN: [id number] DOB: [birth year]   Cancelled Treatment:    Reason Eval/Treat Not Completed: Patient at procedure or test/unavailable (pt undergoing catheter removal at present, will attempt later today.[name] Kitchen)   Philomena Doheny 01/04/2016, 10:46 AM [phone]

## 2016-01-04 NOTE — Progress Notes (Signed)
  [facility] Catheter Removal Procedure Note  Diagnosis: ESRD   Plan:  Remove right IJ tunneled dialysis catheter  Consent signed:  Yes.   Time out completed:  Yes.   Coumadin:  No. On Brilinta.  PT/INR (if applicable):   Other labs:  Procedure: 1.  Sterile prepping and draping over catheter area 2. 10 ml 2% lidocaine plain instilled at removal site. 3.  right catheter removed in its entirety with cuff in tact. 4.  Complications:  none 5. Tip of catheter sent for culture:  No.   Patient tolerated procedure well:  Yes.   Pressure held, no bleeding noted, dressing applied Instructions given to the pt regarding wound care and bleeding.    Virgina Jock, [place] [phone] 01/04/2016 11:11 AM

## 2016-01-04 NOTE — Care Management Important Message (Signed)
Important Message  Patient Details  Name: Dale [name] MRN: [id number] Date of Birth: [birth year]   Medicare Important Message Given:  Yes    Merrell Rettinger 01/04/2016, 2:10 PM

## 2016-01-04 NOTE — Progress Notes (Signed)
Pt discharged home per MD order with wife via wheelchair. All discharge instructions given to patient, verbalized understanding. Paper copy of discharge given. Pt discharged with all belongings.

## 2016-01-04 NOTE — Progress Notes (Signed)
[facility] Progress Note   Subjective: On HD now, [facility] removed by vascular.    Objective Vitals:   01/04/16 0512 01/04/16 0712 01/04/16 0812 01/04/16 0953  BP: (!) 123/33  (!) 123/35 130/61  Pulse: 84  87 85  Resp: (!) 24  20   Temp: 97.9 F (36.6 C)  98.3 F (36.8 C)   TempSrc:   Oral   SpO2: 98% 97% 100%   Weight: 130.2 kg (287 lb)     Height:       Physical Exam General: well nourished, NAD Heart:S1,S2, RRR SR on monitor Lungs: BBS CTA A/P Abdomen: obese, non-tender, nondistended Extremities: L BKA no LE edema Dialysis Access: LUA AVF +bruit  Dialysis Orders: MWF AF 4h 57min 2/2.25 bath 127kg Hep 7000 P4 IJ cath/ LUA AVF (have started using AVF at OP center approx 2 wks now, just got down to 15 ga w 2 needles)  No ESA, weekly Fe at center  Assessment: 1. Acute GI Bleed- friable gastric mucosa on EGD w remnants from bleeding. No identifiable source of bleeding found. S/P prbc's (6-8 units total). PPI. 2. CP/ NSTEMI: peak trop 18, abnormal stress, had cath w PCI/ DES to mid-diagonal off of LAD. On Brilanta/ ASA. 3. ESRD- MWF HD. HD cath removed today. Using AVF now.  4. Volume - up 2-3kg, euvolemic on exam 5. ChronicHypotension-on midodrine pre HD 6. Anemia- not on ESA at center, started ESA now darbe 80 mg/ wk; tsat 10% 7. DM2 - on insulin  8. Resp failure/ VDRF - resolved 9. Nose Bleed: ENT consult called per primary. Bleeding areas cauterized.   Plan - HD today.    Kelly Splinter MD [facility] pager [phone]    cell [phone] 01/03/2016, 10:28 AM  Additional Objective Labs: Basic Metabolic Panel:  Recent Labs Lab 01/02/16 1603 01/03/16 0640 01/04/16 0331  NA 131* 129* 130*  K 3.5 4.1 4.5  CL 95* 94* 93*  CO2 25 24 25   GLUCOSE 301* 352* 318*  BUN 23* 37* 52*  CREATININE 4.18* 5.84* 7.92*  CALCIUM 7.8* 7.8* 8.0*  PHOS 3.5 4.1 5.5*    CBC:  Recent Labs Lab 01/01/16 1955 01/02/16 0050  01/02/16 0513 01/03/16 0640 01/04/16 0331  WBC 12.3* 11.6* 11.2* 9.3 9.0  HGB 8.9* 8.3* 8.2* 7.5* 7.4*  HCT 27.5* 26.0* 24.6* 23.5* 22.6*  MCV 92.0 92.5 91.4 94.4 94.6  PLT 277 285 266 248 218   Blood Culture    Component Value Date/Time   SDES BLOOD LEFT HAND 07/04/2012 2014   SPECREQUEST BOTTLES DRAWN AEROBIC ONLY 4CC 07/04/2012 2014   CULT NO GROWTH 5 DAYS 07/04/2012 2014   REPTSTATUS 07/11/2012 FINAL 07/04/2012 2014    Cardiac Enzymes: No results for input(s): CKTOTAL, CKMB, CKMBINDEX, TROPONINI in the last 168 hours. CBG:  Recent Labs Lab 01/03/16 1747 01/03/16 2124 01/04/16 0030 01/04/16 0740 01/04/16 1122  GLUCAP 161* 405* 384* 275* 207*   Iron Studies: No results for input(s): IRON, TIBC, TRANSFERRIN, FERRITIN in the last 72 hours. @lablastinr3 @ Studies/Results: No results found. Medications:   . aspirin  81 mg Oral Daily  . atorvastatin  80 mg Oral q1800  . feeding supplement (NEPRO CARB STEADY)  237 mL Oral Q24H  . fluticasone  1 spray Each Nare Daily  . gabapentin  200 mg Oral TID  . insulin aspart  0-20 Units Subcutaneous TID WC  . insulin aspart  6 Units Subcutaneous TID WC  . insulin glargine  22 Units Subcutaneous  BID  . ipratropium-albuterol  3 mL Nebulization BID  . levothyroxine  100 mcg Oral QAC breakfast  . loratadine  5 mg Oral BID  . metoprolol tartrate  12.5 mg Oral BID  . oxymetazoline  1 spray Each Nare BID  . pantoprazole  40 mg Oral BID AC  . predniSONE  5 mg Oral Q breakfast  . rOPINIRole  2 mg Oral Q supper  . sodium chloride flush  3 mL Intravenous Q12H  . sodium chloride flush  3 mL Intravenous Q12H  . ticagrelor  90 mg Oral BID

## 2016-01-06 ENCOUNTER — Encounter (HOSPITAL_COMMUNITY): Payer: Self-pay | Admitting: *Deleted

## 2016-01-06 ENCOUNTER — Telehealth: Payer: Self-pay | Admitting: Cardiovascular Disease

## 2016-01-06 ENCOUNTER — Emergency Department (HOSPITAL_COMMUNITY): Payer: Medicare Other

## 2016-01-06 ENCOUNTER — Emergency Department (HOSPITAL_COMMUNITY)
Admission: EM | Admit: 2016-01-06 | Discharge: 2016-01-07 | Disposition: A | Discharge status: Home / Self Care | Payer: Medicare Other | Attending: Emergency Medicine | Admitting: Emergency Medicine

## 2016-01-06 DIAGNOSIS — I252 Old myocardial infarction: Secondary | ICD-10-CM | POA: Insufficient documentation

## 2016-01-06 DIAGNOSIS — R509 Fever, unspecified: Secondary | ICD-10-CM | POA: Diagnosis not present

## 2016-01-06 DIAGNOSIS — E039 Hypothyroidism, unspecified: Secondary | ICD-10-CM | POA: Insufficient documentation

## 2016-01-06 DIAGNOSIS — I132 Hypertensive heart and chronic kidney disease with heart failure and with stage 5 chronic kidney disease, or end stage renal disease: Secondary | ICD-10-CM | POA: Diagnosis not present

## 2016-01-06 DIAGNOSIS — Z992 Dependence on renal dialysis: Secondary | ICD-10-CM | POA: Diagnosis not present

## 2016-01-06 DIAGNOSIS — N186 End stage renal disease: Secondary | ICD-10-CM | POA: Insufficient documentation

## 2016-01-06 DIAGNOSIS — D509 Iron deficiency anemia, unspecified: Secondary | ICD-10-CM | POA: Diagnosis not present

## 2016-01-06 DIAGNOSIS — I5032 Chronic diastolic (congestive) heart failure: Secondary | ICD-10-CM | POA: Insufficient documentation

## 2016-01-06 DIAGNOSIS — Z794 Long term (current) use of insulin: Secondary | ICD-10-CM | POA: Diagnosis not present

## 2016-01-06 DIAGNOSIS — Z955 Presence of coronary angioplasty implant and graft: Secondary | ICD-10-CM | POA: Diagnosis not present

## 2016-01-06 DIAGNOSIS — I2510 Atherosclerotic heart disease of native coronary artery without angina pectoris: Secondary | ICD-10-CM | POA: Insufficient documentation

## 2016-01-06 DIAGNOSIS — J209 Acute bronchitis, unspecified: Secondary | ICD-10-CM | POA: Insufficient documentation

## 2016-01-06 DIAGNOSIS — Z79899 Other long term (current) drug therapy: Secondary | ICD-10-CM | POA: Diagnosis not present

## 2016-01-06 DIAGNOSIS — Z7982 Long term (current) use of aspirin: Secondary | ICD-10-CM | POA: Diagnosis not present

## 2016-01-06 DIAGNOSIS — R0602 Shortness of breath: Secondary | ICD-10-CM | POA: Diagnosis not present

## 2016-01-06 DIAGNOSIS — E1121 Type 2 diabetes mellitus with diabetic nephropathy: Secondary | ICD-10-CM | POA: Diagnosis not present

## 2016-01-06 DIAGNOSIS — R05 Cough: Secondary | ICD-10-CM | POA: Diagnosis not present

## 2016-01-06 DIAGNOSIS — D62 Acute posthemorrhagic anemia: Secondary | ICD-10-CM | POA: Diagnosis not present

## 2016-01-06 LAB — COMPREHENSIVE METABOLIC PANEL
ALT: 43 U/L (ref 17–63)
AST: 29 U/L (ref 15–41)
Albumin: 2.9 g/dL — ABNORMAL LOW (ref 3.5–5.0)
Alkaline Phosphatase: 186 U/L — ABNORMAL HIGH (ref 38–126)
Anion gap: 10 (ref 5–15)
BUN: 18 mg/dL (ref 6–20)
CO2: 29 mmol/L (ref 22–32)
Calcium: 8.7 mg/dL — ABNORMAL LOW (ref 8.9–10.3)
Chloride: 95 mmol/L — ABNORMAL LOW (ref 101–111)
Creatinine, Ser: 5.12 mg/dL — ABNORMAL HIGH (ref 0.61–1.24)
GFR calc Af Amer: 14 mL/min — ABNORMAL LOW (ref >60)
GFR calc non Af Amer: 12 mL/min — ABNORMAL LOW (ref >60)
Glucose, Bld: 103 mg/dL — ABNORMAL HIGH (ref 65–99)
Potassium: 4.2 mmol/L (ref 3.5–5.1)
Sodium: 134 mmol/L — ABNORMAL LOW (ref 135–145)
Total Bilirubin: 0.5 mg/dL (ref 0.3–1.2)
Total Protein: 6.5 g/dL (ref 6.5–8.1)

## 2016-01-06 LAB — CBC WITH DIFFERENTIAL/PLATELET
Basophils Absolute: 0 10*3/uL (ref 0.0–0.1)
Basophils Relative: 0 %
Eosinophils Absolute: 0.3 10*3/uL (ref 0.0–0.7)
Eosinophils Relative: 4 %
HCT: 25.4 % — ABNORMAL LOW (ref 39.0–52.0)
Hemoglobin: 7.8 g/dL — ABNORMAL LOW (ref 13.0–17.0)
Lymphocytes Relative: 16 %
Lymphs Abs: 1.4 10*3/uL (ref 0.7–4.0)
MCH: 29.9 pg (ref 26.0–34.0)
MCHC: 30.7 g/dL (ref 30.0–36.0)
MCV: 97.3 fL (ref 78.0–100.0)
Monocytes Absolute: 0.6 10*3/uL (ref 0.1–1.0)
Monocytes Relative: 7 %
Neutro Abs: 6.5 10*3/uL (ref 1.7–7.7)
Neutrophils Relative %: 73 %
Platelets: 269 10*3/uL (ref 150–400)
RBC: 2.61 MIL/uL — ABNORMAL LOW (ref 4.22–5.81)
RDW: 16.7 % — ABNORMAL HIGH (ref 11.5–15.5)
WBC: 8.8 10*3/uL (ref 4.0–10.5)

## 2016-01-06 LAB — I-STAT CG4 LACTIC ACID, ED: Lactic Acid, Venous: 0.96 mmol/L (ref 0.5–1.9)

## 2016-01-06 MED ORDER — PREDNISONE 20 MG PO TABS
60.0000 mg | ORAL_TABLET | Freq: Once | ORAL | Status: AC
  Administered 2016-01-07: 60 mg via ORAL
  Filled 2016-01-06: qty 3

## 2016-01-06 MED ORDER — ALBUTEROL SULFATE (2.5 MG/3ML) 0.083% IN NEBU
5.0000 mg | INHALATION_SOLUTION | Freq: Once | RESPIRATORY_TRACT | Status: AC
  Administered 2016-01-07: 5 mg via RESPIRATORY_TRACT
  Filled 2016-01-06: qty 6

## 2016-01-06 MED ORDER — IPRATROPIUM BROMIDE 0.02 % IN SOLN
0.5000 mg | Freq: Once | RESPIRATORY_TRACT | Status: AC
  Administered 2016-01-07: 0.5 mg via RESPIRATORY_TRACT
  Filled 2016-01-06: qty 2.5

## 2016-01-06 NOTE — Telephone Encounter (Signed)
New Message:    Had Dale [name] last Friday,now he is running a fever of about 100. Please call to advise.

## 2016-01-06 NOTE — Telephone Encounter (Signed)
TOC appt Richardson Dopp 9/06/02/15 @ 8:45am

## 2016-01-06 NOTE — ED Provider Notes (Signed)
[place] DEPT Provider Note   CSN: [id number] Arrival date & time: 01/06/16  2123     History   Chief Complaint Chief Complaint  Patient presents with  . Fever  . Cough    HPI Dale [name] is a 50 y.o. male.  Patients with history of ESRD on dialysis, last episode of dialysis today and was normal, NSTEMI with recent stent  (L groin puncture), GI bleed, diastolic heart failure -- presents with complaint of cough, SOB, fever at home to 100.39F. symptoms were much worse over the past day. Patient was discharged from hospital on 01/04/16. Patient denies chest pain or abdominal pain. History of left lower extremity AKA. No unusual swelling in right lower extremity. No treatments prior to arrival. Patient was concerned that he may have an infection at the site of dialysis catheter in his right chest which was recently removed or at the puncture site in his left groin. No worsening pain in either of these sites. The onset of this condition was acute. The course is constant. Aggravating factors: none. Alleviating factors: none.        Past Medical History:  Diagnosis Date  . Anemia of chronic disease   . Arthritis    "hands, right knee" (03/19/2014)  . CAD (coronary artery disease)    a. Lexiscan Myoview (11/15):  Inf, apical cap and apical lateral ischemia, EF 56%, inf HK, Moderate Risk;  b. LHC (11/15):  mid to dist Dx 80%, mid RCA 99% (functional CTO), dist RCA 80% >> PCI: balloon angioplasty to mid RCA (could not deliver stent) - plan staged PCI with HSRA of RCA; Dx to be tx medically >> PCI: Rotoblator atherectomy/DES to [facility]  . Cholecystitis    a. 08/27/2011  . Chronic diastolic CHF (congestive heart failure) ([place])    a. Echo 3/13:  EF 55-60%;  b. Echo (11/15):  Mild LVH, EF 60-65%, no RWMA, Gr 1 DD, MAC, mild LAE, normal RVF, mild RAE;  c.  Echo 5/16:  severe LVH, EF 55-60%, no RWMA, Gr 1 DD, MAC, mild LAE  . Claustrophobia    when things get around his face.   . Difficult  intubation    only once in 2015 at [facility] haven't had a problem since then  . ESRD (end stage renal disease) ([place])    a. 1995 s/p cadaveric transplant w/ susbequent failure after 18 yrs;  b.Dialysis initiated 07/2011 [facility] M-[id number]  . Fibromyalgia   . GERD (gastroesophageal reflux disease)   . Gout    PMH  . History of blood transfusion   . HLD (hyperlipidemia)   . Hypertension   . Hypothyroidism   . Inguinal lymphadenopathy    a. bilateral - s/p biopsy 07/2011  . Insulin dependent diabetes mellitus (HCC)    Type I  . OSA (obstructive sleep apnea)    adjustable bed  . Pericardial effusion    a.  Small by CT 08/22/11;  b.  Large by CT 08/27/11  . Pneumonia ~ 2007    Patient Active Problem List   Diagnosis Date Noted  . Stridor   . Iron deficiency anemia   . Encounter for central line placement   . Gastrointestinal hemorrhage with melena 12/21/2015  . Encounter for intubation   . Acute blood loss anemia   . Uremia   . Acute encephalopathy   . Troponin level elevated   . Pulmonary edema 03/09/2015  . Fluid overload 03/09/2015  . Chronic pain 03/09/2015  .  Hyperkalemia 03/09/2015  . Hypoglycemia 03/09/2015  . Diabetes mellitus ([place]) 03/09/2015  . Chest pain at rest   . Accelerated hypertension 02/24/2015  . DM (diabetes mellitus) type II controlled with renal manifestation ([place]) 02/24/2015  . Acute pulmonary edema ([place]) 01/21/2015  . Acute dyspnea 01/18/2015  . Volume overload 01/18/2015  . HLD (hyperlipidemia) 11/27/2014  . Hypoxia 09/30/2014  . Dizziness 09/30/2014  . Hypotension 09/30/2014  . ESRD on dialysis ([place]) 09/30/2014  . Coronary artery disease involving native coronary artery of native heart with unstable angina pectoris ([place]) 03/20/2014  . Allergic reaction to dye 03/20/2014  . NSTEMI (non-ST elevated myocardial infarction) ([place]) 03/19/2014  . HTN (hypertension) 02/18/2014  . Influenza with other respiratory manifestations 06/17/2013  . Acute  respiratory failure with hypoxia ([place]) 06/16/2013  . Tracheobronchitis 06/16/2013  . Salmonella bacteremia 07/10/2012  . Salmonella gastroenteritis 07/10/2012  . Acute respiratory failure ([place]) 07/04/2012  . Septic shock ([place]) 07/04/2012  . Metabolic acidosis 09/38/1829  . Fever and chills 07/03/2012  . Diarrhea 07/03/2012  . Nausea 07/03/2012  . Leukocytosis 07/03/2012  . AKI (acute kidney injury) ([place]) 07/03/2012  . Pain in the chest 06/18/2012  . Swelling of arm 03/19/2012  . Colitis, ischemic ([place]) 08/31/2011  . ESRD (end stage renal disease) ([place]) 08/27/2011  . Abdominal pain 08/27/2011  . Pericardial effusion 08/27/2011  . Constipation, acute 08/23/2011  . History of renal transplant 07/22/2011  . S/P BKA (below knee amputation) ([place]) 07/22/2011  . Obstructive sleep apnea 07/22/2011  . Dyspnea 07/22/2011  . Chronic diastolic CHF (congestive heart failure) ([place]) 07/22/2011  . Anemia 07/22/2011  . Diabetes mellitus with nephropathy ([place]) 07/22/2011    Past Surgical History:  Procedure Laterality Date  . AMPUTATION OF REPLICATED TOES Right   . ANGIOPLASTY  04/09/2014   RCA   . APPENDECTOMY  ~ 2006  . ARTERIOVENOUS GRAFT PLACEMENT Left 1993?   forearm  . ARTERIOVENOUS GRAFT PLACEMENT Right 1993?   leg  . ARTERIOVENOUS GRAFT PLACEMENT Left    Thigh  . Arteriovenous Graft Removed Left    Thigh  . AV FISTULA PLACEMENT  07/29/2011   Procedure: ARTERIOVENOUS (AV) FISTULA CREATION;  Surgeon: Mal Misty, MD;  Location: [place];  Service: Vascular;  Laterality: Right;  Brachial cephalic  . AV FISTULA PLACEMENT Left 01/20/2015   Procedure: LIGATION OF RIGHT ARTERIOVENOUS FISTULA WITH EXCISION OF ANEURYSM;  Surgeon: Angelia Mould, MD;  Location: [place];  Service: Vascular;  Laterality: Left;  . AV FISTULA PLACEMENT Left 04/30/2015   Procedure: CREATION OF LEFT ARM BRACHIO-CEPHALIC ARTERIOVENOUS (AV) FISTULA ;  Surgeon: Angelia Mould, MD;  Location: [place];  Service:  Vascular;  Laterality: Left;  . AV Graft PLACEMENT Left 1993?   "attempted one in my wrist; didn't take"  . BELOW KNEE LEG AMPUTATION Left 2010  . CARDIAC CATHETERIZATION  03/19/2014  . CARDIAC CATHETERIZATION  03/19/2014   Procedure: CORONARY BALLOON ANGIOPLASTY;  Surgeon: Burnell Blanks, MD;  Location: [facility] CATH LAB;  Service: Cardiovascular;;  . CARDIAC CATHETERIZATION N/A 01/01/2016   Procedure: Left Heart Cath and Coronary Angiography;  Surgeon: Troy Sine, MD;  Location: [place] CV LAB;  Service: Cardiovascular;  Laterality: N/A;  . CARDIAC CATHETERIZATION N/A 01/01/2016   Procedure: Coronary Stent Intervention;  Surgeon: Troy Sine, MD;  Location: [place] CV LAB;  Service: Cardiovascular;  Laterality: N/A;  . CARPAL TUNNEL RELEASE Bilateral after 2005  . CATARACT EXTRACTION W/ INTRAOCULAR LENS  IMPLANT, BILATERAL Bilateral 1990's  .  COLONOSCOPY  08/29/2011   Procedure: COLONOSCOPY;  Surgeon: Jerene Bears, MD;  Location: [place];  Service: Gastroenterology;  Laterality: N/A;  . CORONARY ANGIOPLASTY WITH STENT PLACEMENT  04/09/2014       ptca/des mid lad   . ESOPHAGOGASTRODUODENOSCOPY N/A 12/25/2015   Procedure: ESOPHAGOGASTRODUODENOSCOPY (EGD);  Surgeon: Mauri Pole, MD;  Location: [facility] ENDOSCOPY;  Service: Endoscopy;  Laterality: N/A;  bedside  . EYE SURGERY Bilateral    cataract  . FEMORAL ARTERY REPAIR  04/09/2014   ANGIOSEAL  . INSERTION OF DIALYSIS CATHETER  08/01/2011   Procedure: INSERTION OF DIALYSIS CATHETER;  Surgeon: Rosetta Posner, MD;  Location: [place];  Service: Vascular;  Laterality: Right;  insertion of dialysis catheter on right internal jugular vein  . INSERTION OF DIALYSIS CATHETER Right 01/20/2015   Procedure: INSERTION OF DIALYSIS CATHETER - RIGHT INTERNAL JUGULAR ;  Surgeon: Angelia Mould, MD;  Location: [place];  Service: Vascular;  Laterality: Right;  . KIDNEY TRANSPLANT  04/25/1993  . LEFT HEART CATHETERIZATION WITH CORONARY ANGIOGRAM  N/A 03/19/2014   Procedure: LEFT HEART CATHETERIZATION WITH CORONARY ANGIOGRAM;  Surgeon: Burnell Blanks, MD;  Location: [facility] CATH LAB;  Service: Cardiovascular;  Laterality: N/A;  . LYMPH NODE BIOPSY  08/10/2011   Procedure: LYMPH NODE BIOPSY;  Surgeon: Merrie Roof, MD;  Location: [place];  Service: General;  Laterality: Left;  left groin lymph biopsy  . NEUROPLASTY / TRANSPOSITION ULNAR NERVE AT ELBOW Left after 2005  . PERCUTANEOUS CORONARY ROTOBLATOR INTERVENTION (PCI-R) N/A 04/09/2014   Procedure: PERCUTANEOUS CORONARY ROTOBLATOR INTERVENTION (PCI-R);  Surgeon: Burnell Blanks, MD;  Location: [facility] C[facility] LAB;  Service: Cardiovascular;  Laterality: N/A;  . PERIPHERAL VASCULAR CATHETERIZATION N/A 12/25/2014   Procedure: Upper Extremity Venography;  Surgeon: Conrad Kingsland, MD;  Location: [place] CV LAB;  Service: Cardiovascular;  Laterality: N/A;  . PERITONEAL CATHETER INSERTION    . PERITONEAL CATHETER REMOVAL    . REVISON OF ARTERIOVENOUS FISTULA Right 1/61/0960   Procedure: PLICATION / REVISION OF ARTERIOVENOUS FISTULA;  Surgeon: Angelia Mould, MD;  Location: [place];  Service: Vascular;  Laterality: Right;  . REVISON OF ARTERIOVENOUS FISTULA Left 09/01/2015   Procedure: SUPERFICIALIZATION OF LEFT ARM BRACHIOCEPHALIC ARTERIOVENOUS FISTULA;  Surgeon: Angelia Mould, MD;  Location: [place];  Service: Vascular;  Laterality: Left;  . SHOULDER ARTHROSCOPY Left ~ 2006   frozen  . TOE AMPUTATION Bilateral after 2005  . UMBILICAL HERNIA REPAIR  1990's  . VENOGRAM Right 07/28/2011   Procedure: VENOGRAM;  Surgeon: Conrad Warren, MD;  Location: [facility] CATH LAB;  Service: Cardiovascular;  Laterality: Right;  . VITRECTOMY Bilateral        Home Medications    Prior to Admission medications   Medication Sig Start Date End Date Taking? Authorizing Provider  acetaminophen (TYLENOL) 500 MG tablet Take 1,000 mg by mouth every 6 (six) hours as needed for moderate pain or headache.   Yes  Historical Provider, MD  allopurinol (ZYLOPRIM) 100 MG tablet Take 200 mg by mouth daily.  03/14/12  Yes Historical Provider, MD  aspirin EC 81 MG tablet Take 81 mg by mouth at bedtime.   Yes Historical Provider, MD  atorvastatin (LIPITOR) 80 MG tablet Take 1 tablet (80 mg total) by mouth daily at 6 PM. 01/04/16  Yes Florencia Reasons, MD  calcium acetate (PHOSLO) 667 MG capsule Take 1,334-2,001 mg by mouth See admin instructions. Take 3 capsules (2001 mg) by mouth with meals and 2  capsule (1334mg  ) with snacks 09/19/12  Yes Historical Provider, MD  diphenhydrAMINE (BENADRYL) 25 mg capsule Take 1-2 capsules (25-50 mg total) by mouth every 6 (six) hours as needed for itching. Patient taking differently: Take 50 mg by mouth 2 (two) times daily as needed for itching or sleep.  04/11/14  Yes Rhonda G Barrett, PA-C  fenofibrate (TRICOR) 48 MG tablet Take 1 tablet (48 mg total) by mouth daily. 02/27/15  Yes Shanker Kristeen Mans, MD  Ferric Citrate (AURYXIA) 1 GM 210 MG(Fe) TABS Take 2 g by mouth 3 (three) times daily with meals.   Yes Historical Provider, MD  gabapentin (NEURONTIN) 300 MG capsule Take 300 mg by mouth 3 (three) times daily.   Yes Historical Provider, MD  GLUCAGON EMERGENCY 1 MG injection Inject 1 mg into the muscle daily as needed (low blood sugar).  01/19/12  Yes Historical Provider, MD  HYDROmorphone (DILAUDID) 2 MG tablet Take 1 tablet (2 mg total) by mouth 3 (three) times daily as needed for severe pain. Patient taking differently: Take 2 mg by mouth 2 (two) times daily. Take 1 tablet (2 mg) by mouth daily at bedtime, may take an additional dose during the day as needed for severe pain 11/20/14  Yes Alvia Grove, PA-C  ibuprofen (ADVIL,MOTRIN) 200 MG tablet Take 400 mg by mouth 2 (two) times daily as needed for moderate pain.   Yes Historical Provider, MD  Insulin Human (INSULIN PUMP) 100 unit/ml SOLN Inject 1 each into the skin continuous. Humalog   Yes Historical Provider, MD  insulin lispro  (HUMALOG) 100 UNIT/ML injection Inject 175 Units into the skin continuous. Up to 175 units continuous in pump,  Basal rates area as follows:  2400 1.5 units/hr, 0300 1.9 units/hr, 0800 1.45 units/hr, 1200 1.625 units/hr   Yes Historical Provider, MD  levothyroxine (SYNTHROID, LEVOTHROID) 100 MCG tablet Take 100 mcg by mouth at bedtime.    Yes Historical Provider, MD  loratadine (CLARITIN) 10 MG tablet Take 10 mg by mouth daily.   Yes Historical Provider, MD  loratadine-pseudoephedrine (CLARITIN-D 12-HOUR) 5-120 MG tablet Take 1 tablet by mouth 2 (two) times daily.   Yes Historical Provider, MD  metoCLOPramide (REGLAN) 5 MG tablet Take 5 mg by mouth 4 (four) times daily - after meals and at bedtime. 05/26/11  Yes Historical Provider, MD  metoprolol tartrate (LOPRESSOR) 25 MG tablet Take 0.5 tablets (12.5 mg total) by mouth 2 (two) times daily. 01/04/16  Yes Florencia Reasons, MD  midodrine (PROAMATINE) 10 MG tablet Take 20 mg by mouth every Monday, Wednesday, and Friday. On dialysis days   Yes Historical Provider, MD  multivitamin (RENA-VIT) TABS tablet Take 1 tablet by mouth daily.   Yes Historical Provider, MD  nitroGLYCERIN (NITROSTAT) 0.4 MG SL tablet Place 0.4 mg under the tongue every 5 (five) minutes as needed for chest pain.    Yes [name], MD  Nutritional Supplements (FEEDING SUPPLEMENT, NEPRO CARB STEADY,) LIQD Take 237 mLs by mouth daily. 01/04/16  Yes Florencia Reasons, MD  omeprazole (PRILOSEC) 20 MG capsule Take 20 mg by mouth daily.   Yes Historical Provider, MD  oxymetazoline (AFRIN) 0.05 % nasal spray Place 1 spray into both nostrils 2 (two) times daily. 01/04/16  Yes Florencia Reasons, MD  promethazine (PHENERGAN) 25 MG tablet Take 1 tablet (25 mg total) by mouth every 6 (six) hours as needed for nausea or vomiting. For nausea 11/27/14  Yes Liliane Shi, PA-C  rOPINIRole (REQUIP) 2 MG tablet Take  2 mg by mouth daily with supper.    Yes Historical Provider, MD  ticagrelor (BRILINTA) 90 MG TABS tablet Take 1  tablet (90 mg total) by mouth 2 (two) times daily. Please contact cardiology Dr  Camillia Herter office for prior authorization needs or refills. 01/04/16  Yes Florencia Reasons, MD    Family History Family History  Problem Relation Age of Onset  . Colon polyps Father   . Diabetes Father   . Hypertension Father   . Hypertension Mother   . Diabetes Mother   . Hyperlipidemia Mother   . Breast cancer Maternal Aunt   . Cancer Maternal Aunt     Breast and Bone  . Malignant hyperthermia Neg Hx   . Heart attack Neg Hx   . Stroke Neg Hx     Social History Social History  Substance Use Topics  . Smoking status: Never Smoker  . Smokeless tobacco: Never Used  . Alcohol use Yes     Allergies   Contrast media [iodinated diagnostic agents]; Iodides; Ioxaglate; Rocephin [ceftriaxone]; Amoxicillin; Buprenorphine hcl; Clopidogrel; Enalapril; Hydrocodone; Morphine; Morphine and related; Adhesive [tape]; Ciprofloxacin; Clindamycin/lincomycin; Codeine; Doxycycline; Enalaprilat; Levofloxacin; Lincomycin hcl; Mobic [meloxicam]; Oxycodone; Plavix [clopidogrel bisulfate]; and Vasotec   Review of Systems Review of Systems  Constitutional: Positive for fatigue and fever.  HENT: Negative for rhinorrhea and sore throat.   Eyes: Negative for redness.  Respiratory: Positive for cough, shortness of breath and wheezing.   Cardiovascular: Negative for chest pain.  Gastrointestinal: Negative for abdominal pain, diarrhea, nausea and vomiting.  Genitourinary: Negative for dysuria.  Musculoskeletal: Negative for myalgias.  Skin: Positive for wound (R upper chest, L groin bruising). Negative for rash.  Neurological: Negative for headaches.     Physical Exam Updated Vital Signs BP 127/56 (BP Location: Right Arm)   Pulse 105   Temp 98.9 F (37.2 C) (Oral)   Resp 22   Ht 5\' 9"  (1.753 m)   Wt 127 kg   SpO2 94%   BMI 41.35 kg/m   Physical Exam  Constitutional: He appears well-developed and well-nourished. He  appears distressed (Mild).  HENT:  Head: Normocephalic and atraumatic.  Mouth/Throat: Oropharynx is clear and moist.  Eyes: Conjunctivae are normal. Right eye exhibits no discharge. Left eye exhibits no discharge.  Neck: Normal range of motion. Neck supple.  Cardiovascular: Normal rate, regular rhythm and normal heart sounds.   Pulmonary/Chest: Effort normal. He has wheezes (Moderate expiratory).  Abdominal: Soft. He exhibits no mass. There is no tenderness. There is no guarding.  Musculoskeletal: Normal range of motion. He exhibits no edema or deformity.  R groin with expected ecchymosis. No significant tenderness. No overlying erythema or drainage suspicious for cellulitis or abscess. There is a small linear break in the skin in the crease of the groin without overt infection.  Neurological: He is alert.  Skin: Skin is warm and dry.  Well-healing puncture wound in the right anterior chest wall at site of previous catheter. Left upper extremity fistula noted.  Psychiatric: He has a normal mood and affect.  Nursing note and vitals reviewed.    ED Treatments / Results  Labs (all labs ordered are listed, but only abnormal results are displayed) Labs Reviewed  CBC WITH DIFFERENTIAL/PLATELET - Abnormal; Notable for the following:       Result Value   RBC 2.61 (*)    Hemoglobin 7.8 (*)    HCT 25.4 (*)    RDW 16.7 (*)    All other components within normal  limits  COMPREHENSIVE METABOLIC PANEL - Abnormal; Notable for the following:    Sodium 134 (*)    Chloride 95 (*)    Glucose, Bld 103 (*)    Creatinine, Ser 5.12 (*)    Calcium 8.7 (*)    Albumin 2.9 (*)    Alkaline Phosphatase 186 (*)    GFR calc non Af Amer 12 (*)    GFR calc Af Amer 14 (*)    All other components within normal limits  I-STAT CG4 LACTIC ACID, ED    EKG  EKG Interpretation  Date/Time:  Wednesday January 06 2016 21:48:54 EDT Ventricular Rate:  105 PR Interval:  138 QRS Duration: 74 QT  Interval:  340 QTC Calculation: 449 R Axis:   60 Text Interpretation:  Sinus tachycardia Low voltage QRS Borderline ECG When compared with ECG of 01/02/2016, No significant change was found Confirmed by [facility]  MD, DAVID ([id number]) on 01/06/2016 11:03:37 PM       Radiology Dg Chest 2 View  Result Date: 01/06/2016 CLINICAL DATA:  Cough for 2 weeks since discharge from hospital. Shortness of breath. EXAM: CHEST  2 VIEW COMPARISON:  Chest radiograph 01/01/2016 FINDINGS: Right IJ central venous catheter is been removed. Cardiomediastinal silhouette remains enlarged. No focal airspace consolidation or pulmonary edema. No pneumothorax or sizable pleural effusion. IMPRESSION: No active cardiopulmonary disease. Electronically Signed   By: Ulyses Jarred M.D.   On: 01/06/2016 22:39    Procedures Procedures (including critical care time)  Medications Ordered in ED Medications  albuterol (PROVENTIL) (2.5 MG/3ML) 0.083% nebulizer solution 5 mg (5 mg Nebulization Given 01/07/16 0001)  ipratropium (ATROVENT) nebulizer solution 0.5 mg (0.5 mg Nebulization Given 01/07/16 0001)  predniSONE (DELTASONE) tablet 60 mg (60 mg Oral Given 01/07/16 0001)     Initial Impression / Assessment and Plan / ED Course  I have reviewed the triage vital signs and the nursing notes.  Pertinent labs & imaging results that were available during my care of the patient were reviewed by me and considered in my medical decision making (see chart for details).  Clinical Course   Patient seen and examined. Medication ordered. Discussed with Dr. Roxanne Mins who will see. EKG reviewed, unchanged from previous.   Vital signs reviewed and are as follows: BP 127/56 (BP Location: Right Arm)   Pulse 105   Temp 98.9 F (37.2 C) (Oral)   Resp 22   Ht 5\' 9"  (1.753 m)   Wt 127 kg   SpO2 94%   BMI 41.35 kg/m   12:40 AM checked on patient after breathing treatment. He is breathing easier. He reports subjectively feeling better. Wheezing nearly  resolved. Patient continues to have some cough. Will monitor to determine if he needs additional treatments.  2:00 AM Patient continues to feel better although continues to cough. We discussed discharge to home and patient agrees. Will discharge with albuterol inhaler, prednisone, azithromycin, and Tessalon.  Patient will have close follow-up at his dialysis session tomorrow. Discussed that he should return to the emergency department with high persistent fever, worsening shortness of breath, new pain or swelling. Patient verbalizes understanding and agrees with plan.  Final Clinical Impressions(s) / ED Diagnoses   Final diagnoses:  Acute bronchitis, unspecified organism   Patient with bronchitis type symptoms, cough although he has had this since recent intubation. Chest x-ray does not show pneumonia. No signs of fluid overload. Patient had a normal dialysis session today. Low-grade fever at home. Lactate is normal. With blood cell count is  normal. Patient's initial concern was for an infection at catheter puncture site or location of R chest catheter that is now removed. These areas both appear well without obvious signs of infection or drainage. Symptomatic treatment given in emergency department with some improvement. Patient appears well, at time of discharge. No chest pain to suggest pulmonary embolism or ACS.  Patient with anemia in the 7 range which is his recent baseline.  New Prescriptions Discharge Medication List as of 01/07/2016  2:06 AM    START taking these medications   Details  azithromycin (ZITHROMAX) 250 MG tablet Take 1 tablet (250 mg total) by mouth daily., Starting Thu 01/07/2016, Print    benzonatate (TESSALON) 100 MG capsule Take 1 capsule (100 mg total) by mouth every 8 (eight) hours., Starting Thu 01/07/2016, Print    predniSONE (DELTASONE) 20 MG tablet Take 2 tablets (40 mg total) by mouth daily., Starting Thu 01/07/2016, Print         Carlisle Cater, PA-C 01/07/16  [id number]    Delora Fuel, MD 98/26/41 [id number]    Delora Fuel, MD 94/07/68 [id number]

## 2016-01-06 NOTE — Telephone Encounter (Signed)
Spoke with pt. He reports he felt hot and nauseated so he checked his temperature today and reports it is 99.7-100.  He took phenergan which helped nausea some.  Also felt warm and nauseated yesterday but it passed. Pt's wife checked cath site in pt's left groin and reports site looks good. No redness, no drainage. Bruising better. He is not having any chest pain.  He has dry cough which he states is from being intubated while in the hospital. No nasal drainage or signs of a cold.  I told pt to continue to monitor and if any change in cath site to let us know. I told him if fever/nausea continues to go to Urgent Care or contact primary care.   Patient contacted regarding discharge from [facility] on 01/04/16  Patient understands to follow up with provider --Yes. Richardson Dopp, [place] on 01/20/16 at 3:45 at [address] needs late afternoon appt.   Patient understands discharge instructions? yes Patient understands medications and regiment?   Yes  Patient understands to bring all medications to this visit?  yes

## 2016-01-06 NOTE — ED Notes (Signed)
Pt arrived to room B15 from xray at this time

## 2016-01-06 NOTE — ED Triage Notes (Signed)
Patient was in the hospital for 2 weeks.  During his stay stopped breathing on the way to dialysis, had 262ml fluid drawn from his abd, dialysis cath removed and cardiac cath with stent placement (done Friday)

## 2016-01-07 DIAGNOSIS — M797 Fibromyalgia: Secondary | ICD-10-CM | POA: Diagnosis not present

## 2016-01-07 DIAGNOSIS — M109 Gout, unspecified: Secondary | ICD-10-CM | POA: Diagnosis not present

## 2016-01-07 DIAGNOSIS — N186 End stage renal disease: Secondary | ICD-10-CM | POA: Diagnosis not present

## 2016-01-07 DIAGNOSIS — E1022 Type 1 diabetes mellitus with diabetic chronic kidney disease: Secondary | ICD-10-CM | POA: Diagnosis not present

## 2016-01-07 DIAGNOSIS — D631 Anemia in chronic kidney disease: Secondary | ICD-10-CM | POA: Diagnosis not present

## 2016-01-07 DIAGNOSIS — I2510 Atherosclerotic heart disease of native coronary artery without angina pectoris: Secondary | ICD-10-CM | POA: Diagnosis not present

## 2016-01-07 DIAGNOSIS — I214 Non-ST elevation (NSTEMI) myocardial infarction: Secondary | ICD-10-CM | POA: Diagnosis not present

## 2016-01-07 DIAGNOSIS — I5030 Unspecified diastolic (congestive) heart failure: Secondary | ICD-10-CM | POA: Diagnosis not present

## 2016-01-07 DIAGNOSIS — K219 Gastro-esophageal reflux disease without esophagitis: Secondary | ICD-10-CM | POA: Diagnosis not present

## 2016-01-07 DIAGNOSIS — I132 Hypertensive heart and chronic kidney disease with heart failure and with stage 5 chronic kidney disease, or end stage renal disease: Secondary | ICD-10-CM | POA: Diagnosis not present

## 2016-01-07 LAB — I-STAT CG4 LACTIC ACID, ED: Lactic Acid, Venous: 1.13 mmol/L (ref 0.5–1.9)

## 2016-01-07 MED ORDER — PREDNISONE 20 MG PO TABS: 40.0000 mg | ORAL_TABLET | Freq: Every day | ORAL | 0 refills | Status: DC

## 2016-01-07 MED ORDER — ALBUTEROL SULFATE HFA 108 (90 BASE) MCG/ACT IN AERS
2.0000 | INHALATION_SPRAY | RESPIRATORY_TRACT | Status: DC | PRN
  Administered 2016-01-07: 2 via RESPIRATORY_TRACT
  Filled 2016-01-07: qty 6.7

## 2016-01-07 MED ORDER — AZITHROMYCIN 250 MG PO TABS
500.0000 mg | ORAL_TABLET | Freq: Once | ORAL | Status: AC
  Administered 2016-01-07: 500 mg via ORAL
  Filled 2016-01-07: qty 2

## 2016-01-07 MED ORDER — BENZONATATE 100 MG PO CAPS: 100.0000 mg | ORAL_CAPSULE | Freq: Three times a day (TID) | ORAL | 0 refills | Status: DC

## 2016-01-07 MED ORDER — AZITHROMYCIN 250 MG PO TABS: 250.0000 mg | ORAL_TABLET | Freq: Every day | ORAL | 0 refills | Status: DC

## 2016-01-07 NOTE — Discharge Instructions (Signed)
Please read and follow all provided instructions.  Your diagnoses today include:  1. Acute bronchitis, unspecified organism    Tests performed today include:  Chest x-ray - does not show any pneumonia  Blood cell counts - do not show any signs of severe infection  Vital signs. See below for your results today.   Medications prescribed:   Albuterol inhaler - medication that opens up your airway  Use inhaler as follows: 1-2 puffs with spacer every 4 hours as needed for wheezing, cough, or shortness of breath.    Azithromycin - antibiotic for respiratory infection  You have been prescribed an antibiotic medicine: take the entire course of medicine even if you are feeling better. Stopping early can cause the antibiotic not to work.   Tessalon Perles - cough suppressant medication   Prednisone - steroid medicine   It is best to take this medication in the morning to prevent sleeping problems. If you are diabetic, monitor your blood sugar closely and stop taking Prednisone if blood sugar is over 300. Take with food to prevent stomach upset.   Take any prescribed medications only as directed.  Home care instructions:  Follow any educational materials contained in this packet.  Follow-up instructions: Please follow-up with your primary care provider in the next 2 days for further evaluation of your symptoms and a recheck if you are not feeling better.   Return instructions:   Please return to the Emergency Department if you experience worsening symptoms.  Please return with worsening wheezing, shortness of breath, or difficulty breathing.  Return with persistent fever above 101F.   Please return if you have any other emergent concerns.  Additional Information:  Your vital signs today were: BP 136/57    Pulse 102    Temp 99.6 F (37.6 C) (Rectal)    Resp 21    Ht 5\' 9"  (1.753 m)    Wt 127 kg    SpO2 97%    BMI 41.35 kg/m  If your blood pressure (BP) was elevated above  135/85 this visit, please have this repeated by your doctor within one month. --------------

## 2016-01-07 NOTE — Telephone Encounter (Signed)
Thanks

## 2016-01-08 DIAGNOSIS — M2550 Pain in unspecified joint: Secondary | ICD-10-CM | POA: Diagnosis not present

## 2016-01-12 ENCOUNTER — Emergency Department (HOSPITAL_COMMUNITY)
Admission: EM | Admit: 2016-01-12 | Discharge: 2016-01-12 | Disposition: A | Discharge status: Home / Self Care | Payer: Medicare Other | Attending: Emergency Medicine | Admitting: Emergency Medicine

## 2016-01-12 ENCOUNTER — Encounter (HOSPITAL_COMMUNITY): Payer: Self-pay

## 2016-01-12 ENCOUNTER — Emergency Department (HOSPITAL_COMMUNITY): Payer: Medicare Other

## 2016-01-12 DIAGNOSIS — I2510 Atherosclerotic heart disease of native coronary artery without angina pectoris: Secondary | ICD-10-CM | POA: Diagnosis not present

## 2016-01-12 DIAGNOSIS — Z7982 Long term (current) use of aspirin: Secondary | ICD-10-CM | POA: Diagnosis not present

## 2016-01-12 DIAGNOSIS — Z79899 Other long term (current) drug therapy: Secondary | ICD-10-CM | POA: Diagnosis not present

## 2016-01-12 DIAGNOSIS — I5032 Chronic diastolic (congestive) heart failure: Secondary | ICD-10-CM | POA: Diagnosis not present

## 2016-01-12 DIAGNOSIS — E039 Hypothyroidism, unspecified: Secondary | ICD-10-CM | POA: Diagnosis not present

## 2016-01-12 DIAGNOSIS — E1022 Type 1 diabetes mellitus with diabetic chronic kidney disease: Secondary | ICD-10-CM | POA: Insufficient documentation

## 2016-01-12 DIAGNOSIS — E1021 Type 1 diabetes mellitus with diabetic nephropathy: Secondary | ICD-10-CM | POA: Insufficient documentation

## 2016-01-12 DIAGNOSIS — N186 End stage renal disease: Secondary | ICD-10-CM | POA: Insufficient documentation

## 2016-01-12 DIAGNOSIS — R109 Unspecified abdominal pain: Secondary | ICD-10-CM | POA: Diagnosis not present

## 2016-01-12 DIAGNOSIS — R05 Cough: Secondary | ICD-10-CM | POA: Diagnosis not present

## 2016-01-12 DIAGNOSIS — K5790 Diverticulosis of intestine, part unspecified, without perforation or abscess without bleeding: Secondary | ICD-10-CM | POA: Diagnosis not present

## 2016-01-12 DIAGNOSIS — I132 Hypertensive heart and chronic kidney disease with heart failure and with stage 5 chronic kidney disease, or end stage renal disease: Secondary | ICD-10-CM | POA: Diagnosis not present

## 2016-01-12 DIAGNOSIS — Z992 Dependence on renal dialysis: Secondary | ICD-10-CM | POA: Diagnosis not present

## 2016-01-12 DIAGNOSIS — R112 Nausea with vomiting, unspecified: Secondary | ICD-10-CM | POA: Diagnosis not present

## 2016-01-12 DIAGNOSIS — I252 Old myocardial infarction: Secondary | ICD-10-CM | POA: Diagnosis not present

## 2016-01-12 DIAGNOSIS — Z955 Presence of coronary angioplasty implant and graft: Secondary | ICD-10-CM | POA: Insufficient documentation

## 2016-01-12 LAB — CBC
HCT: 26.7 % — ABNORMAL LOW (ref 39.0–52.0)
Hemoglobin: 8.2 g/dL — ABNORMAL LOW (ref 13.0–17.0)
MCH: 30.5 pg (ref 26.0–34.0)
MCHC: 30.7 g/dL (ref 30.0–36.0)
MCV: 99.3 fL (ref 78.0–100.0)
Platelets: 270 10*3/uL (ref 150–400)
RBC: 2.69 MIL/uL — ABNORMAL LOW (ref 4.22–5.81)
RDW: 17.3 % — ABNORMAL HIGH (ref 11.5–15.5)
WBC: 6 10*3/uL (ref 4.0–10.5)

## 2016-01-12 LAB — COMPREHENSIVE METABOLIC PANEL
ALT: 19 U/L (ref 17–63)
AST: 21 U/L (ref 15–41)
Albumin: 2.9 g/dL — ABNORMAL LOW (ref 3.5–5.0)
Alkaline Phosphatase: 130 U/L — ABNORMAL HIGH (ref 38–126)
Anion gap: 9 (ref 5–15)
BUN: 18 mg/dL (ref 6–20)
CO2: 31 mmol/L (ref 22–32)
Calcium: 8.3 mg/dL — ABNORMAL LOW (ref 8.9–10.3)
Chloride: 94 mmol/L — ABNORMAL LOW (ref 101–111)
Creatinine, Ser: 6.69 mg/dL — ABNORMAL HIGH (ref 0.61–1.24)
GFR calc Af Amer: 10 mL/min — ABNORMAL LOW (ref >60)
GFR calc non Af Amer: 9 mL/min — ABNORMAL LOW (ref >60)
Glucose, Bld: 145 mg/dL — ABNORMAL HIGH (ref 65–99)
Potassium: 3.9 mmol/L (ref 3.5–5.1)
Sodium: 134 mmol/L — ABNORMAL LOW (ref 135–145)
Total Bilirubin: 0.5 mg/dL (ref 0.3–1.2)
Total Protein: 6.8 g/dL (ref 6.5–8.1)

## 2016-01-12 LAB — CBG MONITORING, ED
Glucose-Capillary: 57 mg/dL — ABNORMAL LOW (ref 65–99)
Glucose-Capillary: 82 mg/dL (ref 65–99)
Glucose-Capillary: 108 mg/dL — ABNORMAL HIGH (ref 65–99)

## 2016-01-12 LAB — LIPASE, BLOOD: Lipase: 26 U/L (ref 11–51)

## 2016-01-12 MED ORDER — ONDANSETRON HCL 4 MG/2ML IJ SOLN: 4.0000 mg | Freq: Once | INTRAMUSCULAR | Status: DC

## 2016-01-12 MED ORDER — PROMETHAZINE HCL 25 MG/ML IJ SOLN
12.5000 mg | Freq: Once | INTRAMUSCULAR | Status: AC
  Administered 2016-01-12: 12.5 mg via INTRAVENOUS
  Filled 2016-01-12: qty 1

## 2016-01-12 MED ORDER — ALUM & MAG HYDROXIDE-SIMETH 200-200-20 MG/5ML PO SUSP
30.0000 mL | Freq: Once | ORAL | Status: AC
  Administered 2016-01-12: 30 mL via ORAL
  Filled 2016-01-12: qty 30

## 2016-01-12 MED ORDER — METOCLOPRAMIDE HCL 5 MG/ML IJ SOLN
10.0000 mg | Freq: Once | INTRAMUSCULAR | Status: AC
Start: 2016-01-12 — End: 2016-01-12
  Administered 2016-01-12: 10 mg via INTRAVENOUS
  Filled 2016-01-12: qty 2

## 2016-01-12 NOTE — ED Notes (Signed)
Pt states he is aware and is actively tryng to get follow up. Home stable with family.

## 2016-01-12 NOTE — Discharge Instructions (Signed)
It was our pleasure to provide your ER care today - we hope that you feel better.  Take your phenergan as need for nausea.  Hold your insulin if not eating/drinking. Monitor your glucose levels closely today.   Follow up with your kidney doctors at your next dialysis, and with primary care doctor in the next few days.  Return to ER right away if worse, new symptoms, fevers, persistent vomiting, worsening or severe abdominal pain, difficulty breathing, other concern.

## 2016-01-12 NOTE — ED Notes (Signed)
MD at bedside. 

## 2016-01-12 NOTE — ED Notes (Signed)
Patient transported to CT 

## 2016-01-12 NOTE — ED Notes (Signed)
IV attempted x2 without success.

## 2016-01-12 NOTE — ED Triage Notes (Signed)
Patient complains of generalized abdominal discomfort with nausea since Saturday. No vomiting, no diarrhea. Had dialysis yesterday. No acute distress. Has been taking phenergan and zofran for nausea with minimal relief

## 2016-01-12 NOTE — ED Provider Notes (Signed)
[facility] DEPT Provider Note   CSN: [id number] Arrival date & time: 01/12/16  0749     History   Chief Complaint Chief Complaint  Patient presents with  . Abdominal Pain  . Nausea    HPI Quint Chestnut is a 50 y.o. male.  Patient c/o nausea, dry heaves, decreased po intake, and mid abdominal pain for the past 3 days. Symptoms persistent, constant. Has been having normal bms. t 100.5 last night. Makes no urine at baseline. Hx esrd on hd, m/w/f, had normal run yesterday.  occ non prod cough.  Was recently on zpack for bronchitis. Prior abd surgery indicates peritoneal cath, umbilical hernia repair, and appendectomy.  Denies rectal bleeding or melena.       Abdominal Pain   Associated symptoms include nausea. Pertinent negatives include fever and headaches.    Past Medical History:  Diagnosis Date  . Anemia of chronic disease   . Arthritis    "hands, right knee" (03/19/2014)  . CAD (coronary artery disease)    a. Lexiscan Myoview (11/15):  Inf, apical cap and apical lateral ischemia, EF 56%, inf HK, Moderate Risk;  b. LHC (11/15):  mid to dist Dx 80%, mid RCA 99% (functional CTO), dist RCA 80% >> PCI: balloon angioplasty to mid RCA (could not deliver stent) - plan staged PCI with HSRA of RCA; Dx to be tx medically >> PCI: Rotoblator atherectomy/DES to [facility]  . Cholecystitis    a. 08/27/2011  . Chronic diastolic CHF (congestive heart failure) ([place])    a. Echo 3/13:  EF 55-60%;  b. Echo (11/15):  Mild LVH, EF 60-65%, no RWMA, Gr 1 DD, MAC, mild LAE, normal RVF, mild RAE;  c.  Echo 5/16:  severe LVH, EF 55-60%, no RWMA, Gr 1 DD, MAC, mild LAE  . Claustrophobia    when things get around his face.   . Difficult intubation    only once in 2015 at [facility] haven't had a problem since then  . ESRD (end stage renal disease) ([place])    a. 1995 s/p cadaveric transplant w/ susbequent failure after 18 yrs;  b.Dialysis initiated 07/2011 [facility]  . Fibromyalgia   . GERD  (gastroesophageal reflux disease)   . Gout    PMH  . History of blood transfusion   . HLD (hyperlipidemia)   . Hypertension   . Hypothyroidism   . Inguinal lymphadenopathy    a. bilateral - s/p biopsy 07/2011  . Insulin dependent diabetes mellitus (HCC)    Type I  . OSA (obstructive sleep apnea)    adjustable bed  . Pericardial effusion    a.  Small by CT 08/22/11;  b.  Large by CT 08/27/11  . Pneumonia ~ 2007    Patient Active Problem List   Diagnosis Date Noted  . Stridor   . Iron deficiency anemia   . Encounter for central line placement   . Gastrointestinal hemorrhage with melena 12/21/2015  . Encounter for intubation   . Acute blood loss anemia   . Uremia   . Acute encephalopathy   . Troponin level elevated   . Pulmonary edema 03/09/2015  . Fluid overload 03/09/2015  . Chronic pain 03/09/2015  . Hyperkalemia 03/09/2015  . Hypoglycemia 03/09/2015  . Diabetes mellitus ([place]) 03/09/2015  . Chest pain at rest   . Accelerated hypertension 02/24/2015  . DM (diabetes mellitus) type II controlled with renal manifestation ([place]) 02/24/2015  . Acute pulmonary edema ([place]) 01/21/2015  . Acute  dyspnea 01/18/2015  . Volume overload 01/18/2015  . HLD (hyperlipidemia) 11/27/2014  . Hypoxia 09/30/2014  . Dizziness 09/30/2014  . Hypotension 09/30/2014  . ESRD on dialysis ([place]) 09/30/2014  . Coronary artery disease involving native coronary artery of native heart with unstable angina pectoris ([place]) 03/20/2014  . Allergic reaction to dye 03/20/2014  . NSTEMI (non-ST elevated myocardial infarction) ([place]) 03/19/2014  . HTN (hypertension) 02/18/2014  . Influenza with other respiratory manifestations 06/17/2013  . Acute respiratory failure with hypoxia ([place]) 06/16/2013  . Tracheobronchitis 06/16/2013  . Salmonella bacteremia 07/10/2012  . Salmonella gastroenteritis 07/10/2012  . Acute respiratory failure ([place]) 07/04/2012  . Septic shock ([place]) 07/04/2012  . Metabolic acidosis  98/02/9146  . Fever and chills 07/03/2012  . Diarrhea 07/03/2012  . Nausea 07/03/2012  . Leukocytosis 07/03/2012  . AKI (acute kidney injury) ([place]) 07/03/2012  . Pain in the chest 06/18/2012  . Swelling of arm 03/19/2012  . Colitis, ischemic ([place]) 08/31/2011  . ESRD (end stage renal disease) ([place]) 08/27/2011  . Abdominal pain 08/27/2011  . Pericardial effusion 08/27/2011  . Constipation, acute 08/23/2011  . History of renal transplant 07/22/2011  . S/P BKA (below knee amputation) ([place]) 07/22/2011  . Obstructive sleep apnea 07/22/2011  . Dyspnea 07/22/2011  . Chronic diastolic CHF (congestive heart failure) ([place]) 07/22/2011  . Anemia 07/22/2011  . Diabetes mellitus with nephropathy ([place]) 07/22/2011    Past Surgical History:  Procedure Laterality Date  . AMPUTATION OF REPLICATED TOES Right   . ANGIOPLASTY  04/09/2014   RCA   . APPENDECTOMY  ~ 2006  . ARTERIOVENOUS GRAFT PLACEMENT Left 1993?   forearm  . ARTERIOVENOUS GRAFT PLACEMENT Right 1993?   leg  . ARTERIOVENOUS GRAFT PLACEMENT Left    Thigh  . Arteriovenous Graft Removed Left    Thigh  . AV FISTULA PLACEMENT  07/29/2011   Procedure: ARTERIOVENOUS (AV) FISTULA CREATION;  Surgeon: Mal Misty, MD;  Location: [facility];  Service: Vascular;  Laterality: Right;  Brachial cephalic  . AV FISTULA PLACEMENT Left 01/20/2015   Procedure: LIGATION OF RIGHT ARTERIOVENOUS FISTULA WITH EXCISION OF ANEURYSM;  Surgeon: Angelia Mould, MD;  Location: [facility];  Service: Vascular;  Laterality: Left;  . AV FISTULA PLACEMENT Left 04/30/2015   Procedure: CREATION OF LEFT ARM BRACHIO-CEPHALIC ARTERIOVENOUS (AV) FISTULA ;  Surgeon: Angelia Mould, MD;  Location: [facility];  Service: Vascular;  Laterality: Left;  . AV Graft PLACEMENT Left 1993?   "attempted one in my wrist; didn't take"  . BELOW KNEE LEG AMPUTATION Left 2010  . CARDIAC CATHETERIZATION  03/19/2014  . CARDIAC CATHETERIZATION  03/19/2014   Procedure: CORONARY BALLOON  ANGIOPLASTY;  Surgeon: Burnell Blanks, MD;  Location: [facility] CATH LAB;  Service: Cardiovascular;;  . CARDIAC CATHETERIZATION N/A 01/01/2016   Procedure: Left Heart Cath and Coronary Angiography;  Surgeon: Troy Sine, MD;  Location: [place] CV LAB;  Service: Cardiovascular;  Laterality: N/A;  . CARDIAC CATHETERIZATION N/A 01/01/2016   Procedure: Coronary Stent Intervention;  Surgeon: Troy Sine, MD;  Location: [place] CV LAB;  Service: Cardiovascular;  Laterality: N/A;  . CARPAL TUNNEL RELEASE Bilateral after 2005  . CATARACT EXTRACTION W/ INTRAOCULAR LENS  IMPLANT, BILATERAL Bilateral 1990's  . COLONOSCOPY  08/29/2011   Procedure: COLONOSCOPY;  Surgeon: Jerene Bears, MD;  Location: [facility];  Service: Gastroenterology;  Laterality: N/A;  . CORONARY ANGIOPLASTY WITH STENT PLACEMENT  04/09/2014       ptca/des mid lad   . ESOPHAGOGASTRODUODENOSCOPY  N/A 12/25/2015   Procedure: ESOPHAGOGASTRODUODENOSCOPY (EGD);  Surgeon: Mauri Pole, MD;  Location: [facility] ENDOSCOPY;  Service: Endoscopy;  Laterality: N/A;  bedside  . EYE SURGERY Bilateral    cataract  . FEMORAL ARTERY REPAIR  04/09/2014   ANGIOSEAL  . INSERTION OF DIALYSIS CATHETER  08/01/2011   Procedure: INSERTION OF DIALYSIS CATHETER;  Surgeon: Rosetta Posner, MD;  Location: [facility];  Service: Vascular;  Laterality: Right;  insertion of dialysis catheter on right internal jugular vein  . INSERTION OF DIALYSIS CATHETER Right 01/20/2015   Procedure: INSERTION OF DIALYSIS CATHETER - RIGHT INTERNAL JUGULAR ;  Surgeon: Angelia Mould, MD;  Location: [facility];  Service: Vascular;  Laterality: Right;  . KIDNEY TRANSPLANT  04/25/1993  . LEFT HEART CATHETERIZATION WITH CORONARY ANGIOGRAM N/A 03/19/2014   Procedure: LEFT HEART CATHETERIZATION WITH CORONARY ANGIOGRAM;  Surgeon: Burnell Blanks, MD;  Location: [facility] CATH LAB;  Service: Cardiovascular;  Laterality: N/A;  . LYMPH NODE BIOPSY  08/10/2011   Procedure: LYMPH NODE BIOPSY;   Surgeon: Merrie Roof, MD;  Location: [facility];  Service: General;  Laterality: Left;  left groin lymph biopsy  . NEUROPLASTY / TRANSPOSITION ULNAR NERVE AT ELBOW Left after 2005  . PERCUTANEOUS CORONARY ROTOBLATOR INTERVENTION (PCI-R) N/A 04/09/2014   Procedure: PERCUTANEOUS CORONARY ROTOBLATOR INTERVENTION (PCI-R);  Surgeon: Burnell Blanks, MD;  Location: [facility] CATH LAB;  Service: Cardiovascular;  Laterality: N/A;  . PERIPHERAL VASCULAR CATHETERIZATION N/A 12/25/2014   Procedure: Upper Extremity Venography;  Surgeon: Conrad Lebanon, MD;  Location: [place] CV LAB;  Service: Cardiovascular;  Laterality: N/A;  . PERITONEAL CATHETER INSERTION    . PERITONEAL CATHETER REMOVAL    . REVISON OF ARTERIOVENOUS FISTULA Right 5/73/2202   Procedure: PLICATION / REVISION OF ARTERIOVENOUS FISTULA;  Surgeon: Angelia Mould, MD;  Location: [facility];  Service: Vascular;  Laterality: Right;  . REVISON OF ARTERIOVENOUS FISTULA Left 09/01/2015   Procedure: SUPERFICIALIZATION OF LEFT ARM BRACHIOCEPHALIC ARTERIOVENOUS FISTULA;  Surgeon: Angelia Mould, MD;  Location: [facility];  Service: Vascular;  Laterality: Left;  . SHOULDER ARTHROSCOPY Left ~ 2006   frozen  . TOE AMPUTATION Bilateral after 2005  . UMBILICAL HERNIA REPAIR  1990's  . VENOGRAM Right 07/28/2011   Procedure: VENOGRAM;  Surgeon: Conrad [place], MD;  Location: [facility] CATH LAB;  Service: Cardiovascular;  Laterality: Right;  . VITRECTOMY Bilateral        Home Medications    Prior to Admission medications   Medication Sig Start Date End Date Taking? Authorizing Provider  acetaminophen (TYLENOL) 500 MG tablet Take 1,000 mg by mouth every 6 (six) hours as needed for moderate pain or headache.    Historical Provider, MD  allopurinol (ZYLOPRIM) 100 MG tablet Take 200 mg by mouth daily.  03/14/12   Historical Provider, MD  aspirin EC 81 MG tablet Take 81 mg by mouth at bedtime.    Historical Provider, MD  atorvastatin (LIPITOR) 80 MG tablet Take 1  tablet (80 mg total) by mouth daily at 6 PM. 01/04/16   Florencia Reasons, MD  azithromycin (ZITHROMAX) 250 MG tablet Take 1 tablet (250 mg total) by mouth daily. 01/07/16   Carlisle Cater, PA-C  benzonatate (TESSALON) 100 MG capsule Take 1 capsule (100 mg total) by mouth every 8 (eight) hours. 01/07/16   Carlisle Cater, PA-C  calcium acetate (PHOSLO) 667 MG capsule Take 1,334-2,001 mg by mouth See admin instructions. Take 3 capsules (2001 mg) by mouth with meals and 2  capsule (1334mg  ) with snacks 09/19/12   Historical Provider, MD  diphenhydrAMINE (BENADRYL) 25 mg capsule Take 1-2 capsules (25-50 mg total) by mouth every 6 (six) hours as needed for itching. Patient taking differently: Take 50 mg by mouth 2 (two) times daily as needed for itching or sleep.  04/11/14   Evelene Croon Barrett, PA-C  fenofibrate (TRICOR) 48 MG tablet Take 1 tablet (48 mg total) by mouth daily. 02/27/15   Jonetta Osgood, MD  Ferric Citrate (AURYXIA) 1 GM 210 MG(Fe) TABS Take 2 g by mouth 3 (three) times daily with meals.    Historical Provider, MD  gabapentin (NEURONTIN) 300 MG capsule Take 300 mg by mouth 3 (three) times daily.    Historical Provider, MD  GLUCAGON EMERGENCY 1 MG injection Inject 1 mg into the muscle daily as needed (low blood sugar).  01/19/12   Historical Provider, MD  HYDROmorphone (DILAUDID) 2 MG tablet Take 1 tablet (2 mg total) by mouth 3 (three) times daily as needed for severe pain. Patient taking differently: Take 2 mg by mouth 2 (two) times daily. Take 1 tablet (2 mg) by mouth daily at bedtime, may take an additional dose during the day as needed for severe pain 11/20/14   Alvia Grove, PA-C  ibuprofen (ADVIL,MOTRIN) 200 MG tablet Take 400 mg by mouth 2 (two) times daily as needed for moderate pain.    Historical Provider, MD  Insulin Human (INSULIN PUMP) 100 unit/ml SOLN Inject 1 each into the skin continuous. Humalog    Historical Provider, MD  insulin lispro (HUMALOG) 100 UNIT/ML injection Inject 175 Units  into the skin continuous. Up to 175 units continuous in pump,  Basal rates area as follows:  2400 1.5 units/hr, 0300 1.9 units/hr, 0800 1.45 units/hr, 1200 1.625 units/hr    Historical Provider, MD  levothyroxine (SYNTHROID, LEVOTHROID) 100 MCG tablet Take 100 mcg by mouth at bedtime.     Historical Provider, MD  loratadine (CLARITIN) 10 MG tablet Take 10 mg by mouth daily.    Historical Provider, MD  loratadine-pseudoephedrine (CLARITIN-D 12-HOUR) 5-120 MG tablet Take 1 tablet by mouth 2 (two) times daily.    Historical Provider, MD  metoCLOPramide (REGLAN) 5 MG tablet Take 5 mg by mouth 4 (four) times daily - after meals and at bedtime. 05/26/11   Historical Provider, MD  metoprolol tartrate (LOPRESSOR) 25 MG tablet Take 0.5 tablets (12.5 mg total) by mouth 2 (two) times daily. 01/04/16   Florencia Reasons, MD  midodrine (PROAMATINE) 10 MG tablet Take 20 mg by mouth every Monday, Wednesday, and Friday. On dialysis days    Historical Provider, MD  multivitamin (RENA-VIT) TABS tablet Take 1 tablet by mouth daily.    Historical Provider, MD  nitroGLYCERIN (NITROSTAT) 0.4 MG SL tablet Place 0.4 mg under the tongue every 5 (five) minutes as needed for chest pain.     [name], MD  Nutritional Supplements (FEEDING SUPPLEMENT, NEPRO CARB STEADY,) LIQD Take 237 mLs by mouth daily. 01/04/16   Florencia Reasons, MD  omeprazole (PRILOSEC) 20 MG capsule Take 20 mg by mouth daily.    Historical Provider, MD  oxymetazoline (AFRIN) 0.05 % nasal spray Place 1 spray into both nostrils 2 (two) times daily. 01/04/16   Florencia Reasons, MD  predniSONE (DELTASONE) 20 MG tablet Take 2 tablets (40 mg total) by mouth daily. 01/07/16   Carlisle Cater, PA-C  promethazine (PHENERGAN) 25 MG tablet Take 1 tablet (25 mg total) by mouth every 6 (six) hours as  needed for nausea or vomiting. For nausea 11/27/14   Liliane Shi, PA-C  rOPINIRole (REQUIP) 2 MG tablet Take 2 mg by mouth daily with supper.     Historical Provider, MD  ticagrelor (BRILINTA) 90 MG  TABS tablet Take 1 tablet (90 mg total) by mouth 2 (two) times daily. Please contact cardiology Dr  Camillia Herter office for prior authorization needs or refills. 01/04/16   Florencia Reasons, MD    Family History Family History  Problem Relation Age of Onset  . Colon polyps Father   . Diabetes Father   . Hypertension Father   . Hypertension Mother   . Diabetes Mother   . Hyperlipidemia Mother   . Breast cancer Maternal Aunt   . Cancer Maternal Aunt     Breast and Bone  . Malignant hyperthermia Neg Hx   . Heart attack Neg Hx   . Stroke Neg Hx     Social History Social History  Substance Use Topics  . Smoking status: Never Smoker  . Smokeless tobacco: Never Used  . Alcohol use Yes     Allergies   Contrast media [iodinated diagnostic agents]; Iodides; Ioxaglate; Rocephin [ceftriaxone]; Amoxicillin; Buprenorphine hcl; Clopidogrel; Enalapril; Hydrocodone; Morphine; Morphine and related; Adhesive [tape]; Ciprofloxacin; Clindamycin/lincomycin; Codeine; Doxycycline; Enalaprilat; Levofloxacin; Lincomycin hcl; Mobic [meloxicam]; Oxycodone; Plavix [clopidogrel bisulfate]; and Vasotec   Review of Systems Review of Systems  Constitutional: Negative for fever.  HENT: Negative for sore throat.   Eyes: Negative for redness.  Respiratory: Negative for shortness of breath.   Cardiovascular: Negative for chest pain.  Gastrointestinal: Positive for abdominal pain and nausea.  Genitourinary: Negative for flank pain.  Musculoskeletal: Negative for back pain and neck pain.  Skin: Negative for rash.  Neurological: Negative for headaches.  Hematological: Does not bruise/bleed easily.  Psychiatric/Behavioral: Negative for confusion.     Physical Exam Updated Vital Signs BP 169/64 (BP Location: Right Arm)   Pulse 93   Temp 98.4 F (36.9 C) (Oral)   Resp 21   SpO2 99%   Physical Exam  Constitutional: He is oriented to person, place, and time. He appears well-developed and well-nourished. No  distress.  HENT:  Mouth/Throat: Oropharynx is clear and moist.  Eyes: Conjunctivae are normal.  Neck: Neck supple. No tracheal deviation present.  Cardiovascular: Normal rate, regular rhythm, normal heart sounds and intact distal pulses.   Pulmonary/Chest: Effort normal. No accessory muscle usage. No respiratory distress.  Non prod cough. Mild wheezing.   Abdominal: Soft. Bowel sounds are normal. He exhibits no distension and no mass. There is tenderness. There is no rebound and no guarding. No hernia.  Obese. Epigastric upper abd tenderness. No incarc hernia felt.   Genitourinary:  Genitourinary Comments: No cva tenderness  Neurological: He is alert and oriented to person, place, and time.  Skin: Skin is warm and dry. He is not diaphoretic.  Psychiatric: He has a normal mood and affect.  Nursing note and vitals reviewed.    ED Treatments / Results  Labs (all labs ordered are listed, but only abnormal results are displayed)   Results for orders placed or performed during the hospital encounter of 01/12/16  Lipase, blood  Result Value Ref Range   Lipase 26 11 - 51 U/L  Comprehensive metabolic panel  Result Value Ref Range   Sodium 134 (L) 135 - 145 mmol/L   Potassium 3.9 3.5 - 5.1 mmol/L   Chloride 94 (L) 101 - 111 mmol/L   CO2 31 22 - 32 mmol/L  Glucose, Bld 145 (H) 65 - 99 mg/dL   BUN 18 6 - 20 mg/dL   Creatinine, Ser 6.69 (H) 0.61 - 1.24 mg/dL   Calcium 8.3 (L) 8.9 - 10.3 mg/dL   Total Protein 6.8 6.5 - 8.1 g/dL   Albumin 2.9 (L) 3.5 - 5.0 g/dL   AST 21 15 - 41 U/L   ALT 19 17 - 63 U/L   Alkaline Phosphatase 130 (H) 38 - 126 U/L   Total Bilirubin 0.5 0.3 - 1.2 mg/dL   GFR calc non Af Amer 9 (L) >60 mL/min   GFR calc Af Amer 10 (L) >60 mL/min   Anion gap 9 5 - 15  CBC  Result Value Ref Range   WBC 6.0 4.0 - 10.5 K/uL   RBC 2.69 (L) 4.22 - 5.81 MIL/uL   Hemoglobin 8.2 (L) 13.0 - 17.0 g/dL   HCT 26.7 (L) 39.0 - 52.0 %   MCV 99.3 78.0 - 100.0 fL   MCH 30.5 26.0 -  34.0 pg   MCHC 30.7 30.0 - 36.0 g/dL   RDW 17.3 (H) 11.5 - 15.5 %   Platelets 270 150 - 400 K/uL   Ct Abdomen Pelvis Wo Contrast  Result Date: 01/12/2016 CLINICAL DATA:  Mid abdominal pain for 2 days, initial encounter EXAM: CT ABDOMEN AND PELVIS WITHOUT CONTRAST TECHNIQUE: Multidetector CT imaging of the abdomen and pelvis was performed following the standard protocol without IV contrast. COMPARISON:  09/30/2014, 07/04/2012 FINDINGS: Lower chest: Minimal atelectatic changes are noted. No focal confluent infiltrate is seen. Coronary calcifications are noted. Hepatobiliary: No focal liver abnormality is seen. No gallstones, gallbladder wall thickening, or biliary dilatation. Pancreas: Unremarkable. No pancreatic ductal dilatation or surrounding inflammatory changes. Spleen: Normal in size without focal abnormality. Adrenals/Urinary Tract: The adrenal glands are within normal limits. The kidneys are of small and shrunken consistent with the given clinical history of end-stage renal disease. The overall appearance of the kidneys is stable from the prior exam. Left pelvic renal transplant kidney is noted. No obstructive changes are seen. Some vascular calcifications are noted. The renal sinus fat seen on the previous exam of 2014 is not visualized on today's exam and mild perinephric inflammatory changes are noted. This is of uncertain significance as the patient is known to be on current hemodialysis. These changes may be related to chronic rejection. Clinical correlation is recommended. The bladder is decompressed. Stomach/Bowel: Scattered diverticular changes noted without evidence of diverticulitis. The appendix appears to have been surgically removed. No obstructive changes are seen. Vascular/Lymphatic: Scattered aortoiliac calcifications are noted without aneurysmal dilatation. No sizable lymphadenopathy is identified. Reproductive: Within normal limits. Other: Laxity of the anterior abdominal wall is again  identified related to prior surgeries. Some soft tissue prominence is noted adjacent to the common femoral artery on the left likely related to the recent cardiac catheterization and a small subcutaneous hematoma. No pseudoaneurysm is identified. Musculoskeletal: No acute or significant osseous findings. IMPRESSION: Changes of left pelvic renal transplant although obliteration of the renal sinus fat is noted when compared with the prior exam of 2014. This is of uncertain significance but may be related to chronic projection given the patient's current hemodialysis status. Diverticulosis without diverticulitis. Shrunken native kidneys consistent with end-stage renal disease. Changes consistent with the recent cardiac catheterization in the left groin. Small hematoma is noted measuring 3.5 cm in greatest dimension. No acute abnormality is noted. Electronically Signed   By: Inez Catalina M.D.   On: 01/12/2016 11:08  Dg Chest 2 View  Result Date: 01/12/2016 CLINICAL DATA:  Loss of appetite over the last 3 days, nausea, cough EXAM: CHEST  2 VIEW COMPARISON:  Chest x-ray of 01/06/2016 FINDINGS: Moderate cardiomegaly is stable. Somewhat prominent interstitial markings are noted bilaterally and on the lateral view there may be a small amount of fluid in the fissure, suggesting possible mild interstitial edema. No definite pleural effusion is seen. No bony abnormality is noted. IMPRESSION: Cardiomegaly.  Question mild interstitial edema. Electronically Signed   By: Ivar Drape M.D.   On: 01/12/2016 12:11   Dg Chest 2 View  Result Date: 01/06/2016 CLINICAL DATA:  Cough for 2 weeks since discharge from hospital. Shortness of breath. EXAM: CHEST  2 VIEW COMPARISON:  Chest radiograph 01/01/2016 FINDINGS: Right IJ central venous catheter is been removed. Cardiomediastinal silhouette remains enlarged. No focal airspace consolidation or pulmonary edema. No pneumothorax or sizable pleural effusion. IMPRESSION: No active  cardiopulmonary disease. Electronically Signed   By: Ulyses Jarred M.D.   On: 01/06/2016 22:39   Ct Soft Tissue Neck Wo Contrast  Result Date: 12/27/2015 CLINICAL DATA:  50 year old male intubated a few days ago, sensation of throat closing up. Stridor. Initial encounter. EXAM: CT NECK WITHOUT CONTRAST TECHNIQUE: Multidetector CT imaging of the neck was performed following the standard protocol without intravenous contrast. COMPARISON:  Chest CTA 10/02/2014. FINDINGS: Large body habitus. Pharynx and larynx: The vocal cords are in close apposition (series 2, image 85) but this appears similar to the appearance on the 10/02/2014 chest CTA (series 5, image 5 of that exam). The supraglottic larynx and epiglottis are normal. The subglottic trachea does appear diminutive, but again similar to the prior chest CTA. Compare the Highly atelectatic appearance of the trachea on series 2, image 111 today to series 5, image 33 on the prior chest CTA. 7 Pharyngeal contours are within normal limits. Negative parapharyngeal and retropharyngeal spaces. Salivary glands: Negative sublingual space, submandibular glands and parotid glands. Thyroid: Diminutive common normal. Lymph nodes: Negative.  No cervical lymphadenopathy. Vascular: Right IJ approach dual lumen catheter partially visualized. Calcified distal cervical ICA atherosclerosis. Calcified ICA siphons and distal vertebral arteries. Vasculature not otherwise evaluated in the absence of IV contrast. Limited intracranial: Negative. Visualized orbits: Postoperative changes to both globes. Mastoids and visualized paranasal sinuses: Small mucous retention cyst right sphenoid sinus. Other paranasal sinuses are clear. Partial opacification of both tympanic cavities and mastoid air cells. The ossicles appear to remain intact. The external auditory canals appear fairly normal. Skeleton:  No acute osseous abnormality identified. Upper chest: Stable visible lung apices. Partially  visible chronic mediastinal lipomatosis. IMPRESSION: 1. Tracheomalacia. Highly atelectatic appearance of the trachea today, but not significantly changed from a June Chest CTA (see series 2, image 111 today and series 5, image 33 on the 10/02/2014 chest CTA). 2. Likewise, the glottis appears stable since June. The supraglottic larynx is normal. No laryngeal trauma suspected. 3. Calcified atherosclerosis of the distal ICAs and vertebral arteries. Electronically Signed   By: Genevie Ann M.D.   On: 12/27/2015 18:46   Nm Myocar Multi W/spect W/wall Motion / Ef  Result Date: 12/29/2015  There was no ST segment deviation noted during stress.  No T wave inversion was noted during stress.  Defect 1: There is a medium defect of moderate severity present in the mid anterior, mid anterolateral, apical anterior and apical lateral location.  Findings consistent with apical anterolateral ischemia and prior apical anterolateral myocardial infarction.  This is an intermediate risk  study.  The left ventricular ejection fraction is normal (55-65%).  Nuclear stress EF: 64%.    Dg Chest Port 1 View  Result Date: 01/01/2016 CLINICAL DATA:  Cardiac catheterization with stent placement, postprocedural. EXAM: PORTABLE CHEST 1 VIEW COMPARISON:  12/23/2015 FINDINGS: Right IJ dialysis catheter tip:  Right atrium. Prior nasogastric tube is been removed. Prior left IJ line has been removed. The patient is rotated to the right on today's radiograph, reducing diagnostic sensitivity and specificity. Mild to moderate enlargement of the cardiopericardial silhouette, without edema. No pneumothorax. Linear subsegmental atelectasis or scarring at the left lung base, otherwise the lungs appear clear. IMPRESSION: 1. Mild-to-moderate enlargement of the cardiopericardial silhouette but without current evidence of edema or pulmonary venous hypertension. 2. Right IJ dialysis catheter tip:  Right atrium. Electronically Signed   By: Van Clines  M.D.   On: 01/01/2016 19:43   Dg Chest Port 1 View  Result Date: 12/23/2015 CLINICAL DATA:  Respiratory failure. EXAM: PORTABLE CHEST 1 VIEW COMPARISON:  12/22/2015. FINDINGS: Endotracheal tube, NG tube, left eye J line, large caliber right IJ line in stable position. Cardiomegaly with mild pulmonary venous congestion and interstitial prominence suggesting mild congestive heart failure. Small left pleural effusion. Chest is unchanged from prior exam. No pneumothorax . IMPRESSION: 1. Lines and tubes in stable position. 2. Persistent changes of mild congestive heart failure with cardiomegaly, mild pulmonary venous congestion, mild interstitial edema and small left pleural effusion. No interim change from prior exam. Electronically Signed   By: Marcello Moores  Register   On: 12/23/2015 06:58   Dg Chest Port 1 View  Result Date: 12/22/2015 CLINICAL DATA:  Respiratory failure.  Assess endotracheal tube. EXAM: PORTABLE CHEST 1 VIEW COMPARISON:  Chest radiograph 12/21/2015 FINDINGS: Support lines and tubes are in unchanged position. The tip of the endotracheal tube remains at the level of the clavicular heads. Cardiomediastinal contours are unchanged. Persistent pulmonary vascular congestion. Small left pleural effusion. IMPRESSION: 1. Unchanged support lines and tubes. 2. Mild pulmonary edema. Electronically Signed   By: Ulyses Jarred M.D.   On: 12/22/2015 06:41   Dg Chest Port 1 View  Result Date: 12/21/2015 CLINICAL DATA:  Status post central line placement. EXAM: PORTABLE CHEST 1 VIEW COMPARISON:  Single-view of the chest earlier today. FINDINGS: Endotracheal tube is again seen with the tip at the top of the clavicular heads, well above the carina. Right IJ approach dialysis catheter remains in place. A new left IJ central venous catheter is seen with the tip projecting over the mid superior vena cava. No pneumothorax. There is cardiomegaly without edema. IMPRESSION: New left IJ catheter tip projects in the mid  superior vena cava. Negative for pneumothorax. No other change. Electronically Signed   By: Inge Rise M.D.   On: 12/21/2015 15:07   Dg Chest Port 1 View  Result Date: 12/21/2015 CLINICAL DATA:  Intubated EXAM: PORTABLE CHEST 1 VIEW COMPARISON:  Chest radiograph from earlier today. FINDINGS: Endotracheal tube tip is 3.8 cm above the carina. Right internal jugular central venous catheter terminates at the cavoatrial junction. Enteric tube enters stomach with the tip not seen on this image. Stable cardiomediastinal silhouette with cardiomegaly. No pneumothorax. Stable probable trace bilateral pleural effusions. Mild pulmonary edema, not definitely changed. Patchy left lung base opacity appears increased. IMPRESSION: 1. Well-positioned support structures as described. 2. Stable mild congestive heart failure . 3. Increased patchy left lung base opacity, favor atelectasis, cannot exclude a component of pneumonia or aspiration. Electronically Signed   By: Corene Cornea  A Poff M.D.   On: 12/21/2015 11:26   Dg Chest Port 1 View  Result Date: 12/21/2015 CLINICAL DATA:  LEFT chest and arm pain for 2 days. History of diabetes, renal failure, CHF. EXAM: PORTABLE CHEST 1 VIEW COMPARISON:  Chest radiograph March 09, 2015 FINDINGS: Cardiac silhouette is similarly enlarged. Mediastinal silhouette is nonsuspicious, tunneled dialysis catheter via RIGHT internal jugular venous approach with distal tips projecting cavoatrial junction. Strandy densities LEFT lung base. Pulmonary vasculature appears normal, no pleural effusion or focal consolidation. No pneumothorax. Large body habitus. Osseous structures are nonsuspicious. IMPRESSION: Similar cardiomegaly, LEFT lung base atelectasis. Electronically Signed   By: Elon Alas M.D.   On: 12/21/2015 06:49     EKG  EKG Interpretation None       Radiology Ct Abdomen Pelvis Wo Contrast  Result Date: 01/12/2016 CLINICAL DATA:  Mid abdominal pain for 2 days, initial  encounter EXAM: CT ABDOMEN AND PELVIS WITHOUT CONTRAST TECHNIQUE: Multidetector CT imaging of the abdomen and pelvis was performed following the standard protocol without IV contrast. COMPARISON:  09/30/2014, 07/04/2012 FINDINGS: Lower chest: Minimal atelectatic changes are noted. No focal confluent infiltrate is seen. Coronary calcifications are noted. Hepatobiliary: No focal liver abnormality is seen. No gallstones, gallbladder wall thickening, or biliary dilatation. Pancreas: Unremarkable. No pancreatic ductal dilatation or surrounding inflammatory changes. Spleen: Normal in size without focal abnormality. Adrenals/Urinary Tract: The adrenal glands are within normal limits. The kidneys are of small and shrunken consistent with the given clinical history of end-stage renal disease. The overall appearance of the kidneys is stable from the prior exam. Left pelvic renal transplant kidney is noted. No obstructive changes are seen. Some vascular calcifications are noted. The renal sinus fat seen on the previous exam of 2014 is not visualized on today's exam and mild perinephric inflammatory changes are noted. This is of uncertain significance as the patient is known to be on current hemodialysis. These changes may be related to chronic rejection. Clinical correlation is recommended. The bladder is decompressed. Stomach/Bowel: Scattered diverticular changes noted without evidence of diverticulitis. The appendix appears to have been surgically removed. No obstructive changes are seen. Vascular/Lymphatic: Scattered aortoiliac calcifications are noted without aneurysmal dilatation. No sizable lymphadenopathy is identified. Reproductive: Within normal limits. Other: Laxity of the anterior abdominal wall is again identified related to prior surgeries. Some soft tissue prominence is noted adjacent to the common femoral artery on the left likely related to the recent cardiac catheterization and a small subcutaneous hematoma.  No pseudoaneurysm is identified. Musculoskeletal: No acute or significant osseous findings. IMPRESSION: Changes of left pelvic renal transplant although obliteration of the renal sinus fat is noted when compared with the prior exam of 2014. This is of uncertain significance but may be related to chronic projection given the patient's current hemodialysis status. Diverticulosis without diverticulitis. Shrunken native kidneys consistent with end-stage renal disease. Changes consistent with the recent cardiac catheterization in the left groin. Small hematoma is noted measuring 3.5 cm in greatest dimension. No acute abnormality is noted. Electronically Signed   By: Inez Catalina M.D.   On: 01/12/2016 11:08   Dg Chest 2 View  Result Date: 01/12/2016 CLINICAL DATA:  Loss of appetite over the last 3 days, nausea, cough EXAM: CHEST  2 VIEW COMPARISON:  Chest x-ray of 01/06/2016 FINDINGS: Moderate cardiomegaly is stable. Somewhat prominent interstitial markings are noted bilaterally and on the lateral view there may be a small amount of fluid in the fissure, suggesting possible mild interstitial edema. No definite pleural  effusion is seen. No bony abnormality is noted. IMPRESSION: Cardiomegaly.  Question mild interstitial edema. Electronically Signed   By: Ivar Drape M.D.   On: 01/12/2016 12:11    Procedures Procedures (including critical care time)  Medications Ordered in ED Medications - No data to display   Initial Impression / Assessment and Plan / ED Course  I have reviewed the triage vital signs and the nursing notes.  Pertinent labs & imaging results that were available during my care of the patient were reviewed by me and considered in my medical decision making (see chart for details).  Clinical Course    Iv ns. Labs.   Cxr.   abd ct.   Ct neg for acute process.  Pt w initial dry heaves in ED, no emesis.  After reglan and phenergan for nausea (pt indicates does not want zofran), no  emesis, nausea improved.   Patients initial bs normal, repeat in 50's, pt w insulin pump which he stopped (had not eaten yet today).  Po fluids. Recheck bs 80's.  Patient give po fluids/food/meal.  No recurrent emesis.   Afeb. Ct neg acute.   Patient currently appears stable for d/c.     Final Clinical Impressions(s) / ED Diagnoses   Final diagnoses:  None    New Prescriptions New Prescriptions   No medications on file     Lajean Saver, MD 01/12/16 1450

## 2016-01-12 NOTE — ED Notes (Signed)
Pt given ginger ale. Coughs withut bringin it back up.

## 2016-01-12 NOTE — ED Notes (Signed)
Approached pt to discharge and he states he has further questions for MD. Dr. Lillette Boxer notified.

## 2016-01-12 NOTE — ED Notes (Signed)
Pt returns from ct  

## 2016-01-12 NOTE — ED Notes (Signed)
Gave pt graham crackers, per RN.

## 2016-01-12 NOTE — ED Notes (Signed)
CBG 108. 

## 2016-01-12 NOTE — ED Notes (Signed)
Pt's CBG result: 57, informed Millie-RN.

## 2016-01-14 DIAGNOSIS — R509 Fever, unspecified: Secondary | ICD-10-CM | POA: Diagnosis not present

## 2016-01-14 DIAGNOSIS — R079 Chest pain, unspecified: Secondary | ICD-10-CM | POA: Diagnosis not present

## 2016-01-14 DIAGNOSIS — R05 Cough: Secondary | ICD-10-CM | POA: Diagnosis not present

## 2016-01-14 DIAGNOSIS — D649 Anemia, unspecified: Secondary | ICD-10-CM | POA: Diagnosis not present

## 2016-01-14 DIAGNOSIS — R112 Nausea with vomiting, unspecified: Secondary | ICD-10-CM | POA: Diagnosis not present

## 2016-01-15 DIAGNOSIS — N250 Renal osteodystrophy: Secondary | ICD-10-CM | POA: Diagnosis not present

## 2016-01-15 DIAGNOSIS — I2510 Atherosclerotic heart disease of native coronary artery without angina pectoris: Secondary | ICD-10-CM | POA: Diagnosis not present

## 2016-01-15 DIAGNOSIS — I120 Hypertensive chronic kidney disease with stage 5 chronic kidney disease or end stage renal disease: Secondary | ICD-10-CM | POA: Diagnosis not present

## 2016-01-15 DIAGNOSIS — N185 Chronic kidney disease, stage 5: Secondary | ICD-10-CM | POA: Diagnosis not present

## 2016-01-15 DIAGNOSIS — Z89421 Acquired absence of other right toe(s): Secondary | ICD-10-CM | POA: Diagnosis not present

## 2016-01-15 DIAGNOSIS — E1122 Type 2 diabetes mellitus with diabetic chronic kidney disease: Secondary | ICD-10-CM | POA: Diagnosis not present

## 2016-01-15 DIAGNOSIS — Z794 Long term (current) use of insulin: Secondary | ICD-10-CM | POA: Diagnosis not present

## 2016-01-15 DIAGNOSIS — E160 Drug-induced hypoglycemia without coma: Secondary | ICD-10-CM | POA: Diagnosis not present

## 2016-01-15 DIAGNOSIS — Z89512 Acquired absence of left leg below knee: Secondary | ICD-10-CM | POA: Diagnosis not present

## 2016-01-15 DIAGNOSIS — G894 Chronic pain syndrome: Secondary | ICD-10-CM | POA: Diagnosis not present

## 2016-01-15 DIAGNOSIS — N186 End stage renal disease: Secondary | ICD-10-CM | POA: Diagnosis not present

## 2016-01-15 DIAGNOSIS — E669 Obesity, unspecified: Secondary | ICD-10-CM | POA: Diagnosis not present

## 2016-01-15 DIAGNOSIS — K3184 Gastroparesis: Secondary | ICD-10-CM | POA: Diagnosis not present

## 2016-01-15 DIAGNOSIS — I10 Essential (primary) hypertension: Secondary | ICD-10-CM | POA: Diagnosis not present

## 2016-01-15 DIAGNOSIS — E11649 Type 2 diabetes mellitus with hypoglycemia without coma: Secondary | ICD-10-CM | POA: Diagnosis not present

## 2016-01-15 DIAGNOSIS — Z992 Dependence on renal dialysis: Secondary | ICD-10-CM | POA: Diagnosis not present

## 2016-01-15 DIAGNOSIS — R079 Chest pain, unspecified: Secondary | ICD-10-CM | POA: Diagnosis not present

## 2016-01-15 DIAGNOSIS — D649 Anemia, unspecified: Secondary | ICD-10-CM | POA: Diagnosis not present

## 2016-01-15 DIAGNOSIS — E8779 Other fluid overload: Secondary | ICD-10-CM | POA: Diagnosis not present

## 2016-01-15 DIAGNOSIS — E871 Hypo-osmolality and hyponatremia: Secondary | ICD-10-CM | POA: Diagnosis not present

## 2016-01-15 DIAGNOSIS — E875 Hyperkalemia: Secondary | ICD-10-CM | POA: Diagnosis not present

## 2016-01-15 DIAGNOSIS — R509 Fever, unspecified: Secondary | ICD-10-CM | POA: Diagnosis not present

## 2016-01-15 DIAGNOSIS — E1143 Type 2 diabetes mellitus with diabetic autonomic (poly)neuropathy: Secondary | ICD-10-CM | POA: Diagnosis not present

## 2016-01-15 DIAGNOSIS — Z6841 Body Mass Index (BMI) 40.0 and over, adult: Secondary | ICD-10-CM | POA: Diagnosis not present

## 2016-01-15 DIAGNOSIS — D631 Anemia in chronic kidney disease: Secondary | ICD-10-CM | POA: Diagnosis not present

## 2016-01-15 DIAGNOSIS — R05 Cough: Secondary | ICD-10-CM | POA: Diagnosis not present

## 2016-01-15 DIAGNOSIS — E039 Hypothyroidism, unspecified: Secondary | ICD-10-CM | POA: Diagnosis not present

## 2016-01-15 DIAGNOSIS — R112 Nausea with vomiting, unspecified: Secondary | ICD-10-CM | POA: Diagnosis not present

## 2016-01-16 DIAGNOSIS — N185 Chronic kidney disease, stage 5: Secondary | ICD-10-CM | POA: Diagnosis not present

## 2016-01-16 DIAGNOSIS — E875 Hyperkalemia: Secondary | ICD-10-CM | POA: Diagnosis not present

## 2016-01-16 DIAGNOSIS — E871 Hypo-osmolality and hyponatremia: Secondary | ICD-10-CM | POA: Diagnosis not present

## 2016-01-16 DIAGNOSIS — I2510 Atherosclerotic heart disease of native coronary artery without angina pectoris: Secondary | ICD-10-CM | POA: Diagnosis not present

## 2016-01-16 DIAGNOSIS — I10 Essential (primary) hypertension: Secondary | ICD-10-CM | POA: Diagnosis not present

## 2016-01-16 DIAGNOSIS — N186 End stage renal disease: Secondary | ICD-10-CM | POA: Diagnosis not present

## 2016-01-16 DIAGNOSIS — Z992 Dependence on renal dialysis: Secondary | ICD-10-CM | POA: Diagnosis not present

## 2016-01-16 DIAGNOSIS — Z89512 Acquired absence of left leg below knee: Secondary | ICD-10-CM | POA: Diagnosis not present

## 2016-01-16 DIAGNOSIS — E669 Obesity, unspecified: Secondary | ICD-10-CM | POA: Diagnosis not present

## 2016-01-16 DIAGNOSIS — G894 Chronic pain syndrome: Secondary | ICD-10-CM | POA: Diagnosis not present

## 2016-01-16 DIAGNOSIS — E1143 Type 2 diabetes mellitus with diabetic autonomic (poly)neuropathy: Secondary | ICD-10-CM | POA: Diagnosis not present

## 2016-01-16 DIAGNOSIS — E039 Hypothyroidism, unspecified: Secondary | ICD-10-CM | POA: Diagnosis not present

## 2016-01-16 DIAGNOSIS — E11649 Type 2 diabetes mellitus with hypoglycemia without coma: Secondary | ICD-10-CM | POA: Diagnosis not present

## 2016-01-16 DIAGNOSIS — E160 Drug-induced hypoglycemia without coma: Secondary | ICD-10-CM | POA: Diagnosis not present

## 2016-01-16 DIAGNOSIS — N250 Renal osteodystrophy: Secondary | ICD-10-CM | POA: Diagnosis not present

## 2016-01-18 DIAGNOSIS — R040 Epistaxis: Secondary | ICD-10-CM | POA: Diagnosis not present

## 2016-01-18 DIAGNOSIS — H6521 Chronic serous otitis media, right ear: Secondary | ICD-10-CM | POA: Diagnosis not present

## 2016-01-19 ENCOUNTER — Emergency Department (HOSPITAL_COMMUNITY): Payer: Medicare Other

## 2016-01-19 ENCOUNTER — Observation Stay (HOSPITAL_COMMUNITY)
Admission: EM | Admit: 2016-01-19 | Discharge: 2016-01-21 | Disposition: A | Discharge status: Home / Self Care | Payer: Medicare Other | Attending: Family Medicine | Admitting: Family Medicine

## 2016-01-19 ENCOUNTER — Encounter (HOSPITAL_COMMUNITY): Payer: Self-pay | Admitting: Neurology

## 2016-01-19 DIAGNOSIS — R110 Nausea: Secondary | ICD-10-CM | POA: Diagnosis not present

## 2016-01-19 DIAGNOSIS — R109 Unspecified abdominal pain: Secondary | ICD-10-CM | POA: Diagnosis not present

## 2016-01-19 DIAGNOSIS — E1121 Type 2 diabetes mellitus with diabetic nephropathy: Secondary | ICD-10-CM | POA: Diagnosis present

## 2016-01-19 DIAGNOSIS — Z7982 Long term (current) use of aspirin: Secondary | ICD-10-CM | POA: Insufficient documentation

## 2016-01-19 DIAGNOSIS — D649 Anemia, unspecified: Secondary | ICD-10-CM | POA: Diagnosis present

## 2016-01-19 DIAGNOSIS — I25110 Atherosclerotic heart disease of native coronary artery with unstable angina pectoris: Secondary | ICD-10-CM | POA: Diagnosis not present

## 2016-01-19 DIAGNOSIS — Z89512 Acquired absence of left leg below knee: Secondary | ICD-10-CM

## 2016-01-19 DIAGNOSIS — I5032 Chronic diastolic (congestive) heart failure: Secondary | ICD-10-CM | POA: Insufficient documentation

## 2016-01-19 DIAGNOSIS — R05 Cough: Secondary | ICD-10-CM | POA: Diagnosis not present

## 2016-01-19 DIAGNOSIS — E039 Hypothyroidism, unspecified: Secondary | ICD-10-CM | POA: Insufficient documentation

## 2016-01-19 DIAGNOSIS — D509 Iron deficiency anemia, unspecified: Secondary | ICD-10-CM | POA: Diagnosis not present

## 2016-01-19 DIAGNOSIS — I132 Hypertensive heart and chronic kidney disease with heart failure and with stage 5 chronic kidney disease, or end stage renal disease: Secondary | ICD-10-CM | POA: Insufficient documentation

## 2016-01-19 DIAGNOSIS — Z79899 Other long term (current) drug therapy: Secondary | ICD-10-CM | POA: Diagnosis not present

## 2016-01-19 DIAGNOSIS — E1022 Type 1 diabetes mellitus with diabetic chronic kidney disease: Secondary | ICD-10-CM | POA: Diagnosis not present

## 2016-01-19 DIAGNOSIS — Z940 Kidney transplant status: Secondary | ICD-10-CM

## 2016-01-19 DIAGNOSIS — Z23 Encounter for immunization: Secondary | ICD-10-CM | POA: Insufficient documentation

## 2016-01-19 DIAGNOSIS — I252 Old myocardial infarction: Secondary | ICD-10-CM | POA: Diagnosis not present

## 2016-01-19 DIAGNOSIS — E871 Hypo-osmolality and hyponatremia: Secondary | ICD-10-CM | POA: Diagnosis present

## 2016-01-19 DIAGNOSIS — I2510 Atherosclerotic heart disease of native coronary artery without angina pectoris: Secondary | ICD-10-CM | POA: Diagnosis not present

## 2016-01-19 DIAGNOSIS — R1110 Vomiting, unspecified: Secondary | ICD-10-CM | POA: Diagnosis not present

## 2016-01-19 DIAGNOSIS — Z955 Presence of coronary angioplasty implant and graft: Secondary | ICD-10-CM | POA: Diagnosis not present

## 2016-01-19 DIAGNOSIS — R112 Nausea with vomiting, unspecified: Principal | ICD-10-CM | POA: Diagnosis present

## 2016-01-19 DIAGNOSIS — N186 End stage renal disease: Secondary | ICD-10-CM | POA: Diagnosis not present

## 2016-01-19 DIAGNOSIS — K5900 Constipation, unspecified: Secondary | ICD-10-CM | POA: Diagnosis not present

## 2016-01-19 DIAGNOSIS — G4733 Obstructive sleep apnea (adult) (pediatric): Secondary | ICD-10-CM | POA: Diagnosis present

## 2016-01-19 DIAGNOSIS — I5033 Acute on chronic diastolic (congestive) heart failure: Secondary | ICD-10-CM | POA: Diagnosis present

## 2016-01-19 DIAGNOSIS — R531 Weakness: Secondary | ICD-10-CM | POA: Diagnosis not present

## 2016-01-19 DIAGNOSIS — Z89519 Acquired absence of unspecified leg below knee: Secondary | ICD-10-CM

## 2016-01-19 LAB — COMPREHENSIVE METABOLIC PANEL
ALT: 14 U/L — ABNORMAL LOW (ref 17–63)
AST: 21 U/L (ref 15–41)
Albumin: 2.8 g/dL — ABNORMAL LOW (ref 3.5–5.0)
Alkaline Phosphatase: 117 U/L (ref 38–126)
Anion gap: 11 (ref 5–15)
BUN: 16 mg/dL (ref 6–20)
CO2: 28 mmol/L (ref 22–32)
Calcium: 8.5 mg/dL — ABNORMAL LOW (ref 8.9–10.3)
Chloride: 93 mmol/L — ABNORMAL LOW (ref 101–111)
Creatinine, Ser: 7.89 mg/dL — ABNORMAL HIGH (ref 0.61–1.24)
GFR calc Af Amer: 8 mL/min — ABNORMAL LOW (ref >60)
GFR calc non Af Amer: 7 mL/min — ABNORMAL LOW (ref >60)
Glucose, Bld: 89 mg/dL (ref 65–99)
Potassium: 4.2 mmol/L (ref 3.5–5.1)
Sodium: 132 mmol/L — ABNORMAL LOW (ref 135–145)
Total Bilirubin: 0.5 mg/dL (ref 0.3–1.2)
Total Protein: 6.6 g/dL (ref 6.5–8.1)

## 2016-01-19 LAB — I-STAT CG4 LACTIC ACID, ED
Lactic Acid, Venous: 1.06 mmol/L (ref 0.5–1.9)
Lactic Acid, Venous: 1.43 mmol/L (ref 0.5–1.9)

## 2016-01-19 LAB — CBC
HCT: 29.3 % — ABNORMAL LOW (ref 39.0–52.0)
Hemoglobin: 9.1 g/dL — ABNORMAL LOW (ref 13.0–17.0)
MCH: 29.1 pg (ref 26.0–34.0)
MCHC: 31.1 g/dL (ref 30.0–36.0)
MCV: 93.6 fL (ref 78.0–100.0)
Platelets: 319 10*3/uL (ref 150–400)
RBC: 3.13 MIL/uL — ABNORMAL LOW (ref 4.22–5.81)
RDW: 16.6 % — ABNORMAL HIGH (ref 11.5–15.5)
WBC: 5.4 10*3/uL (ref 4.0–10.5)

## 2016-01-19 LAB — I-STAT TROPONIN, ED: Troponin i, poc: 0.01 ng/mL (ref 0.00–0.08)

## 2016-01-19 LAB — GLUCOSE, CAPILLARY: Glucose-Capillary: 154 mg/dL — ABNORMAL HIGH (ref 65–99)

## 2016-01-19 LAB — LIPASE, BLOOD: Lipase: 25 U/L (ref 11–51)

## 2016-01-19 MED ORDER — FERRIC CITRATE 1 GM 210 MG(FE) PO TABS: 2.0000 g | ORAL_TABLET | Freq: Three times a day (TID) | ORAL | Status: DC

## 2016-01-19 MED ORDER — FENOFIBRATE 54 MG PO TABS
54.0000 mg | ORAL_TABLET | Freq: Every day | ORAL | Status: DC
  Administered 2016-01-20 – 2016-01-21 (×2): 54 mg via ORAL
  Filled 2016-01-19 (×2): qty 1

## 2016-01-19 MED ORDER — TICAGRELOR 90 MG PO TABS
90.0000 mg | ORAL_TABLET | Freq: Two times a day (BID) | ORAL | Status: DC
  Administered 2016-01-20 – 2016-01-21 (×4): 90 mg via ORAL
  Filled 2016-01-19 (×4): qty 1

## 2016-01-19 MED ORDER — METOCLOPRAMIDE HCL 5 MG PO TABS
5.0000 mg | ORAL_TABLET | Freq: Three times a day (TID) | ORAL | Status: DC
  Administered 2016-01-20: 5 mg via ORAL
  Filled 2016-01-19 (×2): qty 1

## 2016-01-19 MED ORDER — ALLOPURINOL 100 MG PO TABS
200.0000 mg | ORAL_TABLET | Freq: Every day | ORAL | Status: DC
  Administered 2016-01-20 – 2016-01-21 (×2): 200 mg via ORAL
  Filled 2016-01-19 (×3): qty 2

## 2016-01-19 MED ORDER — ATORVASTATIN CALCIUM 80 MG PO TABS
80.0000 mg | ORAL_TABLET | Freq: Every day | ORAL | Status: DC
  Administered 2016-01-20: 80 mg via ORAL
  Filled 2016-01-19: qty 1

## 2016-01-19 MED ORDER — LORATADINE-PSEUDOEPHEDRINE ER 5-120 MG PO TB12: 1.0000 | ORAL_TABLET | Freq: Two times a day (BID) | ORAL | Status: DC

## 2016-01-19 MED ORDER — HEPARIN SODIUM (PORCINE) 5000 UNIT/ML IJ SOLN
5000.0000 [IU] | Freq: Three times a day (TID) | INTRAMUSCULAR | Status: DC
  Administered 2016-01-20 – 2016-01-21 (×4): 5000 [IU] via SUBCUTANEOUS
  Filled 2016-01-19 (×3): qty 1

## 2016-01-19 MED ORDER — HYDROMORPHONE HCL 1 MG/ML IJ SOLN
1.0000 mg | INTRAMUSCULAR | Status: AC | PRN
  Administered 2016-01-20 (×4): 1 mg via INTRAVENOUS
  Filled 2016-01-19 (×3): qty 1

## 2016-01-19 MED ORDER — METOPROLOL TARTRATE 12.5 MG HALF TABLET
12.5000 mg | ORAL_TABLET | Freq: Two times a day (BID) | ORAL | Status: DC
  Administered 2016-01-20 – 2016-01-21 (×2): 12.5 mg via ORAL
  Filled 2016-01-19 (×3): qty 1

## 2016-01-19 MED ORDER — ACETAMINOPHEN 500 MG PO TABS: 1000.0000 mg | ORAL_TABLET | Freq: Four times a day (QID) | ORAL | Status: DC | PRN

## 2016-01-19 MED ORDER — GABAPENTIN 300 MG PO CAPS
300.0000 mg | ORAL_CAPSULE | Freq: Three times a day (TID) | ORAL | Status: DC
  Administered 2016-01-20 – 2016-01-21 (×3): 300 mg via ORAL
  Filled 2016-01-19 (×5): qty 1

## 2016-01-19 MED ORDER — PROMETHAZINE HCL 25 MG PO TABS
25.0000 mg | ORAL_TABLET | Freq: Four times a day (QID) | ORAL | Status: DC | PRN
Start: 2016-01-19 — End: 2016-01-21

## 2016-01-19 MED ORDER — LEVOTHYROXINE SODIUM 100 MCG PO TABS
100.0000 ug | ORAL_TABLET | Freq: Every day | ORAL | Status: DC
  Filled 2016-01-19 (×2): qty 1

## 2016-01-19 MED ORDER — HYDROMORPHONE HCL 1 MG/ML IJ SOLN
0.5000 mg | Freq: Once | INTRAMUSCULAR | Status: AC
  Administered 2016-01-19: 0.5 mg via INTRAVENOUS
  Filled 2016-01-19: qty 1

## 2016-01-19 MED ORDER — PANTOPRAZOLE SODIUM 40 MG PO TBEC
40.0000 mg | DELAYED_RELEASE_TABLET | Freq: Every day | ORAL | Status: DC
  Administered 2016-01-20 – 2016-01-21 (×2): 40 mg via ORAL
  Filled 2016-01-19 (×2): qty 1

## 2016-01-19 MED ORDER — CALCIUM ACETATE (PHOS BINDER) 667 MG PO CAPS
2001.0000 mg | ORAL_CAPSULE | Freq: Three times a day (TID) | ORAL | Status: DC
  Administered 2016-01-20: 2001 mg via ORAL
  Filled 2016-01-19 (×3): qty 3

## 2016-01-19 MED ORDER — MIDODRINE HCL 5 MG PO TABS
20.0000 mg | ORAL_TABLET | ORAL | Status: DC
  Filled 2016-01-19: qty 4

## 2016-01-19 MED ORDER — PROMETHAZINE HCL 25 MG/ML IJ SOLN
12.5000 mg | Freq: Once | INTRAMUSCULAR | Status: AC
  Administered 2016-01-19: 12.5 mg via INTRAVENOUS
  Filled 2016-01-19: qty 1

## 2016-01-19 MED ORDER — PROMETHAZINE HCL 25 MG RE SUPP
25.0000 mg | Freq: Four times a day (QID) | RECTAL | Status: DC | PRN
  Filled 2016-01-19: qty 1

## 2016-01-19 MED ORDER — PROMETHAZINE HCL 25 MG/ML IJ SOLN
12.5000 mg | Freq: Four times a day (QID) | INTRAMUSCULAR | Status: DC | PRN
  Administered 2016-01-20 – 2016-01-21 (×6): 12.5 mg via INTRAVENOUS
  Filled 2016-01-19 (×5): qty 1

## 2016-01-19 MED ORDER — ASPIRIN EC 81 MG PO TBEC
81.0000 mg | DELAYED_RELEASE_TABLET | Freq: Every day | ORAL | Status: DC
  Filled 2016-01-19 (×2): qty 1

## 2016-01-19 MED ORDER — ROPINIROLE HCL 1 MG PO TABS
2.0000 mg | ORAL_TABLET | Freq: Every day | ORAL | Status: DC
Start: 2016-01-20 — End: 2016-01-21
  Administered 2016-01-20: 2 mg via ORAL
  Filled 2016-01-19: qty 2

## 2016-01-19 MED ORDER — HYDROMORPHONE HCL 2 MG PO TABS: 2.0000 mg | ORAL_TABLET | ORAL | Status: AC | PRN

## 2016-01-19 NOTE — H&P (Signed)
History and Physical  Patient Name: Dale [name]     QBH:[id number]    DOB: 04-15-1966    DOA: 01/19/2016 PCP: [name], MD   Patient coming from: Home  Chief Complaint: Nausea and vomiting  HPI: Dale [name] is a 50 y.o. male with a past medical history significant for ESRD on HD MWF after failed transplant, HFpEF, IDDM on pump, BKA, and CAD s/p PCI who presents with nausea and vomiting.  The patient has had increased nausea for about 1 week, since around the time he was admitted to [facility] on 9/21.  While he was there, he wasn't eating and so things were okay, but on discharge over the weekend, he started to have persistent nausea and NBNB emesis.  On Monday, he saw his ENT for hospital follow up and noted to have temp 101F in their office.  Today, he had persistent vomiting so he came to the ER.  ED course: -Afebrile, heart rate 95, respirations normal, blood pressure and pulse oximetry normal -Na 132, K 4.2, Cr 7.85, WBC 5.4K, Hgb 9.1 -Lipase normal, troponin normal, lactic acid 1.0 -CXR showed mild congestion without focal opacity -CT abdomen without contrast showed no pancreatitis, dilated biliary ducts, appendicitis or diverticulitis, inflamed SB or colon; there was a question of hematoma versus pseudoaneurysm in L inguinal area, recommended duplex US for follow up -TRH were asked to observe given intractable nausea, unclear cause  Of note, the patient was admitted in August here for hyperkalemia and anemia, while getting urgent HD, he had acute respiratory failure, possibly from GI bleed causing hypotension exacerbated by dialysis.  During that hospitalization he had an NSTEMI and underwent LHC and stenting with DES and was placed on ticagrelor.    Since discharge, he was readmitted to [place] [facility] for hypoglycemia and anemia, transfused and discharged last Saturday as noted above.         ROS: Review of Systems  Constitutional: Positive for fever. Negative  for chills, diaphoresis and malaise/fatigue.  HENT: Positive for congestion, ear pain and nosebleeds.   Respiratory: Negative for cough, sputum production and shortness of breath.   Gastrointestinal: Positive for nausea and vomiting. Negative for abdominal pain, blood in stool, constipation, diarrhea and melena.  Genitourinary: Negative for dysuria, flank pain, frequency, hematuria and urgency.  All other systems reviewed and are negative.         Past Medical History:  Diagnosis Date  . Anemia of chronic disease   . Arthritis    "hands, right knee" (03/19/2014)  . CAD (coronary artery disease)    a. Lexiscan Myoview (11/15):  Inf, apical cap and apical lateral ischemia, EF 56%, inf HK, Moderate Risk;  b. LHC (11/15):  mid to dist Dx 80%, mid RCA 99% (functional CTO), dist RCA 80% >> PCI: balloon angioplasty to mid RCA (could not deliver stent) - plan staged PCI with HSRA of RCA; Dx to be tx medically >> PCI: Rotoblator atherectomy/DES to [facility]  . Cholecystitis    a. 08/27/2011  . Chronic diastolic CHF (congestive heart failure) ([place])    a. Echo 3/13:  EF 55-60%;  b. Echo (11/15):  Mild LVH, EF 60-65%, no RWMA, Gr 1 DD, MAC, mild LAE, normal RVF, mild RAE;  c.  Echo 5/16:  severe LVH, EF 55-60%, no RWMA, Gr 1 DD, MAC, mild LAE  . Claustrophobia    when things get around his face.   . Difficult intubation    only once in  2015 at [facility] haven't had a problem since then  . ESRD (end stage renal disease) ([place])    a. 1995 s/p cadaveric transplant w/ susbequent failure after 18 yrs;  b.Dialysis initiated 07/2011 [facility] M-W-F  . Fibromyalgia   . GERD (gastroesophageal reflux disease)   . Gout    PMH  . History of blood transfusion   . HLD (hyperlipidemia)   . Hypertension   . Hypothyroidism   . Inguinal lymphadenopathy    a. bilateral - s/p biopsy 07/2011  . Insulin dependent diabetes mellitus (HCC)    Type I  . OSA (obstructive sleep apnea)    adjustable bed  . Pericardial  effusion    a.  Small by CT 08/22/11;  b.  Large by CT 08/27/11  . Pneumonia ~ 2007    Past Surgical History:  Procedure Laterality Date  . AMPUTATION OF REPLICATED TOES Right   . ANGIOPLASTY  04/09/2014   RCA   . APPENDECTOMY  ~ 2006  . ARTERIOVENOUS GRAFT PLACEMENT Left 1993?   forearm  . ARTERIOVENOUS GRAFT PLACEMENT Right 1993?   leg  . ARTERIOVENOUS GRAFT PLACEMENT Left    Thigh  . Arteriovenous Graft Removed Left    Thigh  . AV FISTULA PLACEMENT  07/29/2011   Procedure: ARTERIOVENOUS (AV) FISTULA CREATION;  Surgeon: Mal Misty, MD;  Location: [place];  Service: Vascular;  Laterality: Right;  Brachial cephalic  . AV FISTULA PLACEMENT Left 01/20/2015   Procedure: LIGATION OF RIGHT ARTERIOVENOUS FISTULA WITH EXCISION OF ANEURYSM;  Surgeon: Angelia Mould, MD;  Location: [place];  Service: Vascular;  Laterality: Left;  . AV FISTULA PLACEMENT Left 04/30/2015   Procedure: CREATION OF LEFT ARM BRACHIO-CEPHALIC ARTERIOVENOUS (AV) FISTULA ;  Surgeon: Angelia Mould, MD;  Location: [place];  Service: Vascular;  Laterality: Left;  . AV Graft PLACEMENT Left 1993?   "attempted one in my wrist; didn't take"  . BELOW KNEE LEG AMPUTATION Left 2010  . CARDIAC CATHETERIZATION  03/19/2014  . CARDIAC CATHETERIZATION  03/19/2014   Procedure: CORONARY BALLOON ANGIOPLASTY;  Surgeon: Burnell Blanks, MD;  Location: [facility] CATH LAB;  Service: Cardiovascular;;  . CARDIAC CATHETERIZATION N/A 01/01/2016   Procedure: Left Heart Cath and Coronary Angiography;  Surgeon: Troy Sine, MD;  Location: [place] CV LAB;  Service: Cardiovascular;  Laterality: N/A;  . CARDIAC CATHETERIZATION N/A 01/01/2016   Procedure: Coronary Stent Intervention;  Surgeon: Troy Sine, MD;  Location: [place] CV LAB;  Service: Cardiovascular;  Laterality: N/A;  . CARPAL TUNNEL RELEASE Bilateral after 2005  . CATARACT EXTRACTION W/ INTRAOCULAR LENS  IMPLANT, BILATERAL Bilateral 1990's  . COLONOSCOPY  08/29/2011    Procedure: COLONOSCOPY;  Surgeon: Jerene Bears, MD;  Location: [place];  Service: Gastroenterology;  Laterality: N/A;  . CORONARY ANGIOPLASTY WITH STENT PLACEMENT  04/09/2014       ptca/des mid lad   . ESOPHAGOGASTRODUODENOSCOPY N/A 12/25/2015   Procedure: ESOPHAGOGASTRODUODENOSCOPY (EGD);  Surgeon: Mauri Pole, MD;  Location: [facility] ENDOSCOPY;  Service: Endoscopy;  Laterality: N/A;  bedside  . EYE SURGERY Bilateral    cataract  . FEMORAL ARTERY REPAIR  04/09/2014   ANGIOSEAL  . INSERTION OF DIALYSIS CATHETER  08/01/2011   Procedure: INSERTION OF DIALYSIS CATHETER;  Surgeon: Rosetta Posner, MD;  Location: [place];  Service: Vascular;  Laterality: Right;  insertion of dialysis catheter on right internal jugular vein  . INSERTION OF DIALYSIS CATHETER Right 01/20/2015   Procedure: INSERTION OF  DIALYSIS CATHETER - RIGHT INTERNAL JUGULAR ;  Surgeon: Angelia Mould, MD;  Location: [place];  Service: Vascular;  Laterality: Right;  . KIDNEY TRANSPLANT  04/25/1993  . LEFT HEART CATHETERIZATION WITH CORONARY ANGIOGRAM N/A 03/19/2014   Procedure: LEFT HEART CATHETERIZATION WITH CORONARY ANGIOGRAM;  Surgeon: Burnell Blanks, MD;  Location: [facility] CATH LAB;  Service: Cardiovascular;  Laterality: N/A;  . LYMPH NODE BIOPSY  08/10/2011   Procedure: LYMPH NODE BIOPSY;  Surgeon: Merrie Roof, MD;  Location: [place];  Service: General;  Laterality: Left;  left groin lymph biopsy  . NEUROPLASTY / TRANSPOSITION ULNAR NERVE AT ELBOW Left after 2005  . PERCUTANEOUS CORONARY ROTOBLATOR INTERVENTION (PCI-R) N/A 04/09/2014   Procedure: PERCUTANEOUS CORONARY ROTOBLATOR INTERVENTION (PCI-R);  Surgeon: Burnell Blanks, MD;  Location: [facility] CATH LAB;  Service: Cardiovascular;  Laterality: N/A;  . PERIPHERAL VASCULAR CATHETERIZATION N/A 12/25/2014   Procedure: Upper Extremity Venography;  Surgeon: Conrad Washburn, MD;  Location: [place] CV LAB;  Service: Cardiovascular;  Laterality: N/A;  . PERITONEAL CATHETER  INSERTION    . PERITONEAL CATHETER REMOVAL    . REVISON OF ARTERIOVENOUS FISTULA Right 7/59/1638   Procedure: PLICATION / REVISION OF ARTERIOVENOUS FISTULA;  Surgeon: Angelia Mould, MD;  Location: [place];  Service: Vascular;  Laterality: Right;  . REVISON OF ARTERIOVENOUS FISTULA Left 09/01/2015   Procedure: SUPERFICIALIZATION OF LEFT ARM BRACHIOCEPHALIC ARTERIOVENOUS FISTULA;  Surgeon: Angelia Mould, MD;  Location: [place];  Service: Vascular;  Laterality: Left;  . SHOULDER ARTHROSCOPY Left ~ 2006   frozen  . TOE AMPUTATION Bilateral after 2005  . UMBILICAL HERNIA REPAIR  1990's  . VENOGRAM Right 07/28/2011   Procedure: VENOGRAM;  Surgeon: Conrad Danville, MD;  Location: [facility] CATH LAB;  Service: Cardiovascular;  Laterality: Right;  . VITRECTOMY Bilateral     Social History: Patient lives with his wife.  The patient is wheelchair bound.  He is not a smoker.    Allergies  Allergen Reactions  . Contrast Media [Iodinated Diagnostic Agents] Rash and Itching    Systemic itching and transient rash appearance within hours of receiving contrast  Systemic itching and transient rash appearance within hours of receiving contrast  Redman Syndrome  . Iodides Rash  . Ioxaglate Rash  . Rocephin [Ceftriaxone] Itching  . Amoxicillin Itching and Rash    .Has patient had a PCN reaction causing immediate rash, facial/tongue/throat swelling, SOB or lightheadedness with hypotension: Yes Has patient had a PCN reaction causing severe rash involving mucus membranes or skin necrosis: No Has patient had a PCN reaction that required hospitalization No Has patient had a PCN reaction occurring within the last 10 years: No If all of the above answers are "NO", then may proceed with Cephalosporin use. Has patient had a PCN reaction causing immediate rash, facial/tongue/throat swelling, SOB or lightheadedness with hypotension: Yes Has patient had a PCN reaction causing severe rash involving mucus membranes or  skin necrosis: No Has patient had a PCN reaction that required hospitalization No Has patient had a PCN reaction occurring within the last 10 years: No If all of the above answers are "NO", then may proceed with Cephalosporin use.  . Buprenorphine Hcl Nausea Only and Hives  . Clopidogrel Rash  . Enalapril Rash    cough  . Hydrocodone Rash  . Morphine Nausea Only, Rash and Hives  . Morphine And Related Hives and Nausea Only  . Adhesive [Tape] Other (See Comments) and Itching    Blisters on  arm from adhesive tape at dialysis Blisters on arm from adhesive tape at dialysis  . Prednisone Other (See Comments)    Increases sugar levels  . Ciprofloxacin Itching and Rash  . Clindamycin/Lincomycin Hives and Itching  . Codeine Hives, Itching and Rash  . Doxycycline Rash  . Enalaprilat     Other reaction(s): Cough (ALLERGY/intolerance)  . Levofloxacin Hives and Itching  . Lincomycin Hcl Itching and Hives  . Mobic [Meloxicam] Hives and Rash  . Oxycodone Rash  . Plavix [Clopidogrel Bisulfate] Rash  . Vasotec Cough    Family history: family history includes Breast cancer in his maternal aunt; Cancer in his maternal aunt; Colon polyps in his father; Diabetes in his father and mother; Hyperlipidemia in his mother; Hypertension in his father and mother.  Prior to Admission medications   Medication Sig Start Date End Date Taking? Authorizing Provider  acetaminophen (TYLENOL) 500 MG tablet Take 1,000 mg by mouth every 6 (six) hours as needed for moderate pain or headache.   Yes Historical Provider, MD  allopurinol (ZYLOPRIM) 100 MG tablet Take 200 mg by mouth daily.  03/14/12  Yes Historical Provider, MD  aspirin EC 81 MG tablet Take 81 mg by mouth at bedtime.   Yes Historical Provider, MD  atorvastatin (LIPITOR) 80 MG tablet Take 1 tablet (80 mg total) by mouth daily at 6 PM. 01/04/16  Yes Florencia Reasons, MD  benzonatate (TESSALON) 100 MG capsule Take 1 capsule (100 mg total) by mouth every 8 (eight)  hours. 01/07/16  Yes Carlisle Cater, PA-C  calcium acetate (PHOSLO) 667 MG capsule Take 1,334-2,001 mg by mouth See admin instructions. Take 3 capsules (2001 mg) by mouth with meals and 2 capsule (1334mg  ) with snacks 09/19/12  Yes Historical Provider, MD  diphenhydrAMINE (BENADRYL) 25 mg capsule Take 1-2 capsules (25-50 mg total) by mouth every 6 (six) hours as needed for itching. Patient taking differently: Take 50 mg by mouth 2 (two) times daily as needed for itching or sleep.  04/11/14  Yes Rhonda G Barrett, PA-C  fenofibrate (TRICOR) 48 MG tablet Take 1 tablet (48 mg total) by mouth daily. 02/27/15  Yes Shanker Kristeen Mans, MD  Ferric Citrate (AURYXIA) 1 GM 210 MG(Fe) TABS Take 2 g by mouth 3 (three) times daily with meals.   Yes Historical Provider, MD  gabapentin (NEURONTIN) 300 MG capsule Take 300 mg by mouth 3 (three) times daily.   Yes Historical Provider, MD  GLUCAGON EMERGENCY 1 MG injection Inject 1 mg into the muscle daily as needed (low blood sugar).  01/19/12  Yes Historical Provider, MD  HYDROmorphone (DILAUDID) 2 MG tablet Take 1 tablet (2 mg total) by mouth 3 (three) times daily as needed for severe pain. Patient taking differently: Take 2 mg by mouth 2 (two) times daily. Take 1 tablet (2 mg) by mouth daily at bedtime, may take an additional dose during the day as needed for severe pain 11/20/14  Yes Alvia Grove, PA-C  ibuprofen (ADVIL,MOTRIN) 200 MG tablet Take 400 mg by mouth 2 (two) times daily as needed for moderate pain.   Yes Historical Provider, MD  Insulin Human (INSULIN PUMP) 100 unit/ml SOLN Inject 1 each into the skin continuous. Humalog   Yes Historical Provider, MD  insulin lispro (HUMALOG) 100 UNIT/ML injection Inject 175 Units into the skin continuous. Up to 175 units continuous in pump,  Basal rates area as follows:  2400 1.5 units/hr, 0300 1.9 units/hr, 0800 1.45 units/hr, 1200 1.625 units/hr   Yes  Historical Provider, MD  levothyroxine (SYNTHROID, LEVOTHROID) 100 MCG  tablet Take 100 mcg by mouth at bedtime.    Yes Historical Provider, MD  loratadine (CLARITIN) 10 MG tablet Take 10 mg by mouth daily.   Yes Historical Provider, MD  loratadine-pseudoephedrine (CLARITIN-D 12-HOUR) 5-120 MG tablet Take 1 tablet by mouth 2 (two) times daily.   Yes Historical Provider, MD  metoCLOPramide (REGLAN) 5 MG tablet Take 5 mg by mouth 4 (four) times daily - after meals and at bedtime. 05/26/11  Yes Historical Provider, MD  metoprolol tartrate (LOPRESSOR) 25 MG tablet Take 0.5 tablets (12.5 mg total) by mouth 2 (two) times daily. 01/04/16  Yes Florencia Reasons, MD  midodrine (PROAMATINE) 10 MG tablet Take 20 mg by mouth every Monday, Wednesday, and Friday. On dialysis days   Yes Historical Provider, MD  multivitamin (RENA-VIT) TABS tablet Take 1 tablet by mouth daily.   Yes Historical Provider, MD  nitroGLYCERIN (NITROSTAT) 0.4 MG SL tablet Place 0.4 mg under the tongue every 5 (five) minutes as needed for chest pain.    Yes [name], MD  Nutritional Supplements (FEEDING SUPPLEMENT, NEPRO CARB STEADY,) LIQD Take 237 mLs by mouth daily. 01/04/16  Yes Florencia Reasons, MD  omeprazole (PRILOSEC) 20 MG capsule Take 20 mg by mouth daily.   Yes Historical Provider, MD  promethazine (PHENERGAN) 25 MG tablet Take 1 tablet (25 mg total) by mouth every 6 (six) hours as needed for nausea or vomiting. For nausea 11/27/14  Yes Liliane Shi, PA-C  rOPINIRole (REQUIP) 2 MG tablet Take 2 mg by mouth daily with supper.    Yes Historical Provider, MD  ticagrelor (BRILINTA) 90 MG TABS tablet Take 1 tablet (90 mg total) by mouth 2 (two) times daily. Please contact cardiology Dr  Camillia Herter office for prior authorization needs or refills. 01/04/16  Yes Florencia Reasons, MD  oxymetazoline (AFRIN) 0.05 % nasal spray Place 1 spray into both nostrils 2 (two) times daily. Patient not taking: Reported on 01/19/2016 01/04/16   Florencia Reasons, MD  predniSONE (DELTASONE) 20 MG tablet Take 2 tablets (40 mg total) by mouth daily. Patient not  taking: Reported on 01/19/2016 01/07/16   Carlisle Cater, PA-C       Physical Exam: BP 142/69   Pulse 101   Temp 98.4 F (36.9 C) (Oral)   Resp 16   SpO2 99%  General appearance: Obese adult male, alert and in moderate distress from nausea and coughing.   Eyes: Anicteric, conjunctiva pink, lids and lashes normal. PERRL.    ENT: No nasal deformity, discharge, epistaxis.  Hearing normal. OP moist without lesions.   Neck: No neck masses appreciated, neck fat.  Trachea midline. Skin: Warm and dry.  No jaundice.  No suspicious rashes or lesions. Cardiac: RRR, nl S1-S2, no murmurs appreciated.  Capillary refill is brisk.  JVP not visible.  Trace LE edema in right leg.  Radial pulses 2+ and symmetric. Respiratory: Normal respiratory rate and rhythm.  CTAB without rales or wheezes. Abdomen: Abdomen soft.  No focal TTP or guarding. No ascites, distension, hepatosplenomegaly.   MSK: No deformities or effusions.  No cyanosis or clubbing.  BKA. Neuro: Cranial nerves normal.  Sensation intact to light touch. Speech is fluent.  Muscle strength normal.    Psych: Sensorium intact and responding to questions, attention normal.  Behavior appropriate.  Affect normal.  Judgment and insight appear normal.     Labs on Admission:  I have personally reviewed following labs and imaging studies:  CBC:  Recent Labs Lab 01/19/16 1439  WBC 5.4  HGB 9.1*  HCT 29.3*  MCV 93.6  PLT 706   Basic Metabolic Panel:  Recent Labs Lab 01/19/16 1439  NA 132*  K 4.2  CL 93*  CO2 28  GLUCOSE 89  BUN 16  CREATININE 7.89*  CALCIUM 8.5*   GFR: Estimated Creatinine Clearance: 14.5 mL/min (by C-G formula based on SCr of 7.89 mg/dL (H)).  Liver Function Tests:  Recent Labs Lab 01/19/16 1439  AST 21  ALT 14*  ALKPHOS 117  BILITOT 0.5  PROT 6.6  ALBUMIN 2.8*    Recent Labs Lab 01/19/16 1439  LIPASE 25   No results for input(s): AMMONIA in the last 168 hours. Coagulation Profile: No results for  input(s): INR, PROTIME in the last 168 hours. Cardiac Enzymes: No results for input(s): CKTOTAL, CKMB, CKMBINDEX, TROPONINI in the last 168 hours. BNP (last 3 results) No results for input(s): PROBNP in the last 8760 hours. HbA1C: No results for input(s): HGBA1C in the last 72 hours. CBG: No results for input(s): GLUCAP in the last 168 hours. Lipid Profile: No results for input(s): CHOL, HDL, LDLCALC, TRIG, CHOLHDL, LDLDIRECT in the last 72 hours. Thyroid Function Tests: No results for input(s): TSH, T4TOTAL, FREET4, T3FREE, THYROIDAB in the last 72 hours. Anemia Panel: No results for input(s): VITAMINB12, FOLATE, FERRITIN, TIBC, IRON, RETICCTPCT in the last 72 hours. Sepsis Labs: Invalid input(s): PROCALCITONIN, LACTICIDVEN No results found for this or any previous visit (from the past 240 hour(s)).       Radiological Exams on Admission: Personally reviewed: Ct Abdomen Pelvis Wo Contrast  Result Date: 01/19/2016 CLINICAL DATA:  50 year old male with abdominal pain, nausea vomiting. Low grade fever. EXAM: CT ABDOMEN AND PELVIS WITHOUT CONTRAST TECHNIQUE: Multidetector CT imaging of the abdomen and pelvis was performed following the standard protocol without IV contrast. COMPARISON:  Abdominal CT dated 12/1915 FINDINGS: Evaluation of this exam is limited in the absence of intravenous contrast. The visualized lung bases are clear. A There is coronary vascular calcification. There is hypoattenuation of the cardiac blood pool suggestive of a degree of anemia. Clinical correlation is recommended. No intra-abdominal free air or free fluid. Focal area of hypodensity in the periphery of the left lobe of the liver along the falciform ligament appears similar to prior study and most likely represents a focal fatty infiltration. No intrahepatic biliary ductal dilatation. The gallbladder is mildly distended. No definite calcified gallstone identified. No pericholecystic fluid. The pancreas is somewhat  atrophic for the patient's age. No pancreatic ductal dilatation or peripancreatic stranding. The spleen and the adrenal glands appear unremarkable. The native kidneys are atrophic. Small bilateral renal hypodense lesions are not characterized But likely represent cysts. The visualized ureters appear unremarkable. The urinary bladder is collapsed. A left hemipelvic transplant kidney is noted which appears similar in morphology to the prior study. There is no hydronephrosis or nephrolithiasis of the transplant kidney. No peritransplant fluid collection identified. There is obliterated appearance of the renal sinus fat similar to the prior study but new compared to the CT dated 2014 concerning for failed transplant for chronic rejection. Clinical correlation is recommended. The prostate and seminal vesicles are grossly unremarkable. Small scattered colonic diverticula without active inflammatory changes. Constipation. There is no evidence of bowel obstruction or active inflammation. Appendectomy. There is moderate aortoiliac atherosclerotic disease. The abdominal aorta and IVC are grossly unremarkable on this noncontrast study. No portal venous gas identified. There is atherosclerotic calcification of the mesentery  vasculature. There is bilateral iliac artery atherosclerotic calcification. There is no retroperitoneal or mesenteric adenopathy. A 2.2 x 5.6 cm ovoid structure in the left inguinal region appears slightly larger compared to prior study. This may represent a small focal hematoma related to catheterization. Please see the aneurysm is not excluded. Duplex ultrasound may provide better evaluation. There is mild diffuse stranding and edema of the subcutaneous fat and an area of induration of the skin in the anterior pelvis. No drainable fluid collection/abscess. Right inguinal surgical clips noted. There is a small infraumbilical hernia containing a short segment of small bowel loop without evidence of  obstruction or inflammation. There is degenerative changes of the spine. No acute fracture. L1 inferior endplate Schmorl's node. IMPRESSION: No acute intra-abdominal pelvic pathology. Small left inguinal hematoma slightly increased in size compared to prior study. A pseudo aneurysm is not excluded. Duplex ultrasound may provide better evaluation. Left lower quadrant transplant kidney similar to the prior CT. There is obliteration of the renal sinus fat concerning for failed transplant. Constipation.  No bowel obstruction or active inflammation. Small scattered colonic diverticula. Diffuse subcutaneous stranding and edema. Electronically Signed   By: Anner Crete M.D.   On: 01/19/2016 21:45   Dg Chest 2 View  Result Date: 01/19/2016 CLINICAL DATA:  Cough since 01/04/2016 EXAM: CHEST  2 VIEW COMPARISON:  01/12/2016 FINDINGS: There is mild bilateral interstitial thickening. There is no focal parenchymal opacity. There is no pleural effusion or pneumothorax. There is stable cardiomegaly. The osseous structures are unremarkable. IMPRESSION: Cardiomegaly with mild pulmonary vascular congestion. Electronically Signed   By: Kathreen Devoid   On: 01/19/2016 21:21    EKG: Independently reviewed. Rate 96 QTc 478, no ST changes.   Echocardiogram 12/23/15: EF 19-37% Grade II diastolic dysfcuntion.   LHC 01/01/16: DES to diagonal vessel     Assessment/Plan 1. Intractable nausea and vomiting:  Unclear etiology.  Diagnostic considerations include gastroparesis (for which he was started on Reglan many years ago and takes intermittently), viral gastroenteritis, less likely bacterial enteritis.  Other less likely considerations include anginal equivalent or sepsis/bacteremia.   -Restart Reglan four times daily -Promethazine PRN  -Collect blood cultures -If diarrhea, will obtain GI pathogen panel -Hydromorphone PO or IV as needed for pain    2. ESRD on HD:  -Continue Phoslo -Continue midodrine -Consult  to Nephrology for maintenance HD  3. Chronic diastolic CHF:  No evidence of fluid overload.  4. Anemia:  Stable -Continue iron  5. Hyponatremia:  Mild  6. IDDM:  -Hold insulin pump -Lantus low dose -SSI -Continue gabapentin  7. HTN and CAD secondary prevention:  -Continue ticagrelor for recent stent -Continue aspirin, statin, fibrate  8. Abnormal CT abdomen: -Duplex arterial US of left leg ordered to follow up CT abdomen findings  9. Other medications: -Continue allopruinol -Continue PPI -Continue Requip  10. Hypothyroidism: -Continue levothyroxine        DVT prophylaxis: Heparin  Code Status: FULL  Family Communication: None present  Disposition Plan: Anticipate observation for new fever, WBC, monitor lactic acid and troponin.   Treat nausea symptomatically.  Check duplex US of left leg.   Consults called: None overnight Admission status: OBS At the point of initial evaluation, it is my clinical opinion that admission for OBSERVATION is reasonable and necessary because the patient's presenting complaints in the context of their chronic conditions represent sufficient risk of deterioration or significant morbidity to constitute reasonable grounds for close observation in the hospital setting, but that the patient  may be medically stable for discharge from the hospital within 24 to 48 hours.    Medical decision making: Patient seen at 11:00 PM on 01/19/2016.  The patient was discussed with Dr. Ralene Bathe.  What exists of the patient's chart was reviewed in depth and summarized above.  Clinical condition: stable.        Edwin Dada [facility] Pager [phone]

## 2016-01-19 NOTE — Progress Notes (Deleted)
Cardiology Office Note:    Date:  01/19/2016   ID:  Dale [name], DOB 1965-08-21, MRN [id number]  PCP:  [name], MD  Cardiologist:  Dr. Lauree Chandler   Electrophysiologist:  n/a  Referring MD: [name], MD   No chief complaint on file. *** *** History of Present Illness:    Dale [name] is a 50 y.o. male with a hx of ***  diabetes, HTN, sleep apnea, ESRD, fibromyalgia, chronic anemia, status post left BKA. He underwent renal transplant and had been off of dialysis. He was seen by Dr. Lauree Chandler in 05/2012 in the hospital for chest pain. Diagnostic options were limited as cardiac catheterization would have been detrimental to his transplanted kidney. Therefore, stress testing was not thought to be helpful. Medical therapy was recommended.  He was then admitted 06/2012 with severe septic shock secondary to Salmonella bacteremia. He developed worsening renal function and ultimately hemodialysis was resumed.  He returned in 01/2014 with complaints of dyspnea with exertion.  Myoview demonstrated inferior, apical and apical lateral ischemia (mod risk). LHC demonstrated severe RCA stenosis that was treated with POBA.He ultimately underwent PCI with rotablator atherectomy and DES to the mid RCA.  Patient had significant IV dye allergic reaction post PCI.   Admitted 5/16 with chest pain. Cardiac markers remained normal. Echocardiogram demonstrated normal EF with normal wall motion.   I last saw him for FU in 8/16.  Since then, he was admitted in 11/16 with chest pain in the setting of hypertensive emergency. Cardiac enzymes remained normal. Echocardiogram demonstrated normal LV function with moderate diastolic dysfunction. Inpatient Myoview was low risk. He was readmitted several days later with acute pulmonary edema. He was treated with dialysis.  Returns for surgical clearance. He needs AV fistula placed in his L arm in January.  He denies chest pain,  significant dyspnea, orthopnea, PND, syncope.  No fevers, cough, bleeding.   Prior CV studies that were reviewed today include:    LHC 01/01/16 Left Main Left main was normal and bifurcated into the LAD and left circumflex system. Left Anterior Descending The proximal LAD was mildly calcified and had 30% narrowing before the takeoff of the first diagonal vessel. There was a 95% stenosis in the midportion of the diagonal vessel. The mid LAD had 30% narrowing. Ost LAD lesion, 30% stenosed. Dist LAD lesion, 30% stenosed. First Diagonal Branch 1st Diag lesion, 95% stenosed. Angioplasty: Pre-stent angioplasty was performed using a BALLOON EMERGE MR 2.0X12. A STENT XIENCE ALPINE RX 6.22W97 drug eluting stent was successfully placed. Stent strut is well apposed. The pre-interventional distal flow is normal (TIMI 3). The post-interventional distal flow is normal (TIMI 3). No complications occurred at this lesion. Pressure wire/FFR was not performed on the lesion . IVUS was not performed on the lesion . There is no residual stenosis post intervention. Left Circumflex The left circumflex vessel was angiographically normal. Gave rise to 2 major marginal branches. Right Coronary Artery The RCA had a widely patent stent in the mid segment. There was 30% narrowing in the region of the acute margin prior to an acute marginal branch take off. The PDA bifurcated into 2 limbs. There was 50% narrowing in the midportion of the inferior limb. The PLA was large caliber normal vessel. Prox RCA to Mid RCA lesion, 0% stenosed. Prox RCA to Mid RCA lesion with no stenosis was previously treated. Dist RCA lesion, 30% stenosed. First Right Posterolateral 1st RPLB lesion, 50% stenosed. Two-vessel coronary obstructive disease with  mild calcification of the proximal LAD with 30% narrowing, progressive 95% mid diagonal stenosis, 30% mid LAD narrowing; normal left circumflex coronary artery; and calcified large RCA with a widely patent stent in  the mid RCA at the site of previous atherectomy, 30% acute margin narrowing, and 50% inferior branch PDA stenosis.  Successful PCI to the diagonal vessel with ultimate insertion of a 2.2515 mm Xience Alpine DES stent postdilated with a 2.4 mm with the 95% stenosis being reduced to 0%.  RECOMMENDATION: The patient will continue dual antiplatelet therapy with aspirin and Brilinta.  Medical therapy for concomitant CAD.  Echo 12/23/15 - Left ventricle: Systolic function was normal. The estimated   ejection fraction was in the range of 60% to 65%. Features are   consistent with a pseudonormal left ventricular filling pattern,   with concomitant abnormal relaxation and increased filling   pressure (grade 2 diastolic dysfunction  Myoview 12/29/15  There was no ST segment deviation noted during stress.  No T wave inversion was noted during stress.  Defect 1: There is a medium defect of moderate severity present in the mid anterior, mid anterolateral, apical anterior and apical lateral location.  Findings consistent with apical anterolateral ischemia and prior apical anterolateral myocardial infarction.  This is an intermediate risk study.  The left ventricular ejection fraction is normal (55-65%).  Nuclear stress EF: 64%.  Carotid US 04/09/15 IMPRESSION: 1. No evidence of significant atherosclerotic plaque or stenosis. 2. Vertebral arteries are patent with normal antegrade flow. 3. Intermittently irregular cardiac rhythm.  Myoview 02/26/15 Overall Study Impression:  Myocardial perfusion is normal. The study is normal. This is a low risk study. LV cavity size is normal. The left ventricular ejection fraction is mildly decreased (45-54%). There is no prior study for comparison.  Echo 02/25/15 Mod LVH, EF 60-65%, no RWMA, Gr 2 DD, mild MR  Echo 09/21/14 Severe LVH, EF 55-60%, normal wall motion, grade 1 diastolic dysfunction, MAC, mild LAE  PCI 04/09/14 1. Unstable angina  secondary to severe stenosis mid RCA 2. Successful rotablator atherectomy/PTCA/DES mid RCA  Past Medical History:  Diagnosis Date  . Anemia of chronic disease   . Arthritis    "hands, right knee" (03/19/2014)  . CAD (coronary artery disease)    a. Lexiscan Myoview (11/15):  Inf, apical cap and apical lateral ischemia, EF 56%, inf HK, Moderate Risk;  b. LHC (11/15):  mid to dist Dx 80%, mid RCA 99% (functional CTO), dist RCA 80% >> PCI: balloon angioplasty to mid RCA (could not deliver stent) - plan staged PCI with HSRA of RCA; Dx to be tx medically >> PCI: Rotoblator atherectomy/DES to [facility]  . Cholecystitis    a. 08/27/2011  . Chronic diastolic CHF (congestive heart failure) ([place])    a. Echo 3/13:  EF 55-60%;  b. Echo (11/15):  Mild LVH, EF 60-65%, no RWMA, Gr 1 DD, MAC, mild LAE, normal RVF, mild RAE;  c.  Echo 5/16:  severe LVH, EF 55-60%, no RWMA, Gr 1 DD, MAC, mild LAE  . Claustrophobia    when things get around his face.   . Difficult intubation    only once in 2015 at [facility] haven't had a problem since then  . ESRD (end stage renal disease) ([place])    a. 1995 s/p cadaveric transplant w/ susbequent failure after 18 yrs;  b.Dialysis initiated 07/2011 [facility] M-W-F  . Fibromyalgia   . GERD (gastroesophageal reflux disease)   . Gout    PMH  .  History of blood transfusion   . HLD (hyperlipidemia)   . Hypertension   . Hypothyroidism   . Inguinal lymphadenopathy    a. bilateral - s/p biopsy 07/2011  . Insulin dependent diabetes mellitus (HCC)    Type I  . OSA (obstructive sleep apnea)    adjustable bed  . Pericardial effusion    a.  Small by CT 08/22/11;  b.  Large by CT 08/27/11  . Pneumonia ~ 2007    Past Surgical History:  Procedure Laterality Date  . AMPUTATION OF REPLICATED TOES Right   . ANGIOPLASTY  04/09/2014   RCA   . APPENDECTOMY  ~ 2006  . ARTERIOVENOUS GRAFT PLACEMENT Left 1993?   forearm  . ARTERIOVENOUS GRAFT PLACEMENT Right 1993?   leg  . ARTERIOVENOUS  GRAFT PLACEMENT Left    Thigh  . Arteriovenous Graft Removed Left    Thigh  . AV FISTULA PLACEMENT  07/29/2011   Procedure: ARTERIOVENOUS (AV) FISTULA CREATION;  Surgeon: Mal Misty, MD;  Location: [place];  Service: Vascular;  Laterality: Right;  Brachial cephalic  . AV FISTULA PLACEMENT Left 01/20/2015   Procedure: LIGATION OF RIGHT ARTERIOVENOUS FISTULA WITH EXCISION OF ANEURYSM;  Surgeon: Angelia Mould, MD;  Location: [place];  Service: Vascular;  Laterality: Left;  . AV FISTULA PLACEMENT Left 04/30/2015   Procedure: CREATION OF LEFT ARM BRACHIO-CEPHALIC ARTERIOVENOUS (AV) FISTULA ;  Surgeon: Angelia Mould, MD;  Location: [place];  Service: Vascular;  Laterality: Left;  . AV Graft PLACEMENT Left 1993?   "attempted one in my wrist; didn't take"  . BELOW KNEE LEG AMPUTATION Left 2010  . CARDIAC CATHETERIZATION  03/19/2014  . CARDIAC CATHETERIZATION  03/19/2014   Procedure: CORONARY BALLOON ANGIOPLASTY;  Surgeon: Burnell Blanks, MD;  Location: [facility] CATH LAB;  Service: Cardiovascular;;  . CARDIAC CATHETERIZATION N/A 01/01/2016   Procedure: Left Heart Cath and Coronary Angiography;  Surgeon: Troy Sine, MD;  Location: [place] CV LAB;  Service: Cardiovascular;  Laterality: N/A;  . CARDIAC CATHETERIZATION N/A 01/01/2016   Procedure: Coronary Stent Intervention;  Surgeon: Troy Sine, MD;  Location: [place] CV LAB;  Service: Cardiovascular;  Laterality: N/A;  . CARPAL TUNNEL RELEASE Bilateral after 2005  . CATARACT EXTRACTION W/ INTRAOCULAR LENS  IMPLANT, BILATERAL Bilateral 1990's  . COLONOSCOPY  08/29/2011   Procedure: COLONOSCOPY;  Surgeon: Jerene Bears, MD;  Location: [place];  Service: Gastroenterology;  Laterality: N/A;  . CORONARY ANGIOPLASTY WITH STENT PLACEMENT  04/09/2014       ptca/des mid lad   . ESOPHAGOGASTRODUODENOSCOPY N/A 12/25/2015   Procedure: ESOPHAGOGASTRODUODENOSCOPY (EGD);  Surgeon: Mauri Pole, MD;  Location: [facility] ENDOSCOPY;  Service:  Endoscopy;  Laterality: N/A;  bedside  . EYE SURGERY Bilateral    cataract  . FEMORAL ARTERY REPAIR  04/09/2014   ANGIOSEAL  . INSERTION OF DIALYSIS CATHETER  08/01/2011   Procedure: INSERTION OF DIALYSIS CATHETER;  Surgeon: Rosetta Posner, MD;  Location: [place];  Service: Vascular;  Laterality: Right;  insertion of dialysis catheter on right internal jugular vein  . INSERTION OF DIALYSIS CATHETER Right 01/20/2015   Procedure: INSERTION OF DIALYSIS CATHETER - RIGHT INTERNAL JUGULAR ;  Surgeon: Angelia Mould, MD;  Location: [place];  Service: Vascular;  Laterality: Right;  . KIDNEY TRANSPLANT  04/25/1993  . LEFT HEART CATHETERIZATION WITH CORONARY ANGIOGRAM N/A 03/19/2014   Procedure: LEFT HEART CATHETERIZATION WITH CORONARY ANGIOGRAM;  Surgeon: Burnell Blanks, MD;  Location: [facility] C[facility] LAB;  Service: Cardiovascular;  Laterality: N/A;  . LYMPH NODE BIOPSY  08/10/2011   Procedure: LYMPH NODE BIOPSY;  Surgeon: Merrie Roof, MD;  Location: [place];  Service: General;  Laterality: Left;  left groin lymph biopsy  . NEUROPLASTY / TRANSPOSITION ULNAR NERVE AT ELBOW Left after 2005  . PERCUTANEOUS CORONARY ROTOBLATOR INTERVENTION (PCI-R) N/A 04/09/2014   Procedure: PERCUTANEOUS CORONARY ROTOBLATOR INTERVENTION (PCI-R);  Surgeon: Burnell Blanks, MD;  Location: [facility] C[facility] LAB;  Service: Cardiovascular;  Laterality: N/A;  . PERIPHERAL VASCULAR CATHETERIZATION N/A 12/25/2014   Procedure: Upper Extremity Venography;  Surgeon: Conrad Winkler, MD;  Location: [place] CV LAB;  Service: Cardiovascular;  Laterality: N/A;  . PERITONEAL CATHETER INSERTION    . PERITONEAL CATHETER REMOVAL    . REVISON OF ARTERIOVENOUS FISTULA Right 1/61/0960   Procedure: PLICATION / REVISION OF ARTERIOVENOUS FISTULA;  Surgeon: Angelia Mould, MD;  Location: [place];  Service: Vascular;  Laterality: Right;  . REVISON OF ARTERIOVENOUS FISTULA Left 09/01/2015   Procedure: SUPERFICIALIZATION OF LEFT ARM BRACHIOCEPHALIC  ARTERIOVENOUS FISTULA;  Surgeon: Angelia Mould, MD;  Location: [place];  Service: Vascular;  Laterality: Left;  . SHOULDER ARTHROSCOPY Left ~ 2006   frozen  . TOE AMPUTATION Bilateral after 2005  . UMBILICAL HERNIA REPAIR  1990's  . VENOGRAM Right 07/28/2011   Procedure: VENOGRAM;  Surgeon: Conrad , MD;  Location: [facility] CATH LAB;  Service: Cardiovascular;  Laterality: Right;  . VITRECTOMY Bilateral     Current Medications: Facility-Administered Medications Prior to Visit  Medication Dose Route Frequency Provider Last Rate Last Dose  . 0.9 %  sodium chloride infusion   Intravenous Continuous Angelia Mould, MD      . Chlorhexidine Gluconate Cloth 2 % PADS 6 each  6 each Topical Once Angelia Mould, MD       Outpatient Medications Prior to Visit  Medication Sig Dispense Refill  . acetaminophen (TYLENOL) 500 MG tablet Take 1,000 mg by mouth every 6 (six) hours as needed for moderate pain or headache.    . allopurinol (ZYLOPRIM) 100 MG tablet Take 200 mg by mouth daily.     [name] Kitchen aspirin EC 81 MG tablet Take 81 mg by mouth at bedtime.    [name] Kitchen atorvastatin (LIPITOR) 80 MG tablet Take 1 tablet (80 mg total) by mouth daily at 6 PM. 30 tablet 0  . benzonatate (TESSALON) 100 MG capsule Take 1 capsule (100 mg total) by mouth every 8 (eight) hours. 15 capsule 0  . calcium acetate (PHOSLO) 667 MG capsule Take 1,334-2,001 mg by mouth See admin instructions. Take 3 capsules (2001 mg) by mouth with meals and 2 capsule (1334mg  ) with snacks    . diphenhydrAMINE (BENADRYL) 25 mg capsule Take 1-2 capsules (25-50 mg total) by mouth every 6 (six) hours as needed for itching. (Patient taking differently: Take 50 mg by mouth 2 (two) times daily as needed for itching or sleep. ) 30 capsule 0  . fenofibrate (TRICOR) 48 MG tablet Take 1 tablet (48 mg total) by mouth daily. 30 tablet 0  . Ferric Citrate (AURYXIA) 1 GM 210 MG(Fe) TABS Take 2 g by mouth 3 (three) times daily with meals.    . gabapentin  (NEURONTIN) 300 MG capsule Take 300 mg by mouth 3 (three) times daily.    [name] Kitchen GLUCAGON EMERGENCY 1 MG injection Inject 1 mg into the muscle daily as needed (low blood sugar).     [name] Kitchen HYDROmorphone (DILAUDID) 2 MG tablet Take  1 tablet (2 mg total) by mouth 3 (three) times daily as needed for severe pain. (Patient taking differently: Take 2 mg by mouth 2 (two) times daily. Take 1 tablet (2 mg) by mouth daily at bedtime, may take an additional dose during the day as needed for severe pain) 20 tablet 0  . ibuprofen (ADVIL,MOTRIN) 200 MG tablet Take 400 mg by mouth 2 (two) times daily as needed for moderate pain.    . Insulin Human (INSULIN PUMP) 100 unit/ml SOLN Inject 1 each into the skin continuous. Humalog    . insulin lispro (HUMALOG) 100 UNIT/ML injection Inject 175 Units into the skin continuous. Up to 175 units continuous in pump,  Basal rates area as follows:  2400 1.5 units/hr, 0300 1.9 units/hr, 0800 1.45 units/hr, 1200 1.625 units/hr    . levothyroxine (SYNTHROID, LEVOTHROID) 100 MCG tablet Take 100 mcg by mouth at bedtime.     [name] Kitchen loratadine (CLARITIN) 10 MG tablet Take 10 mg by mouth daily.    [name] Kitchen loratadine-pseudoephedrine (CLARITIN-D 12-HOUR) 5-120 MG tablet Take 1 tablet by mouth 2 (two) times daily.    . metoCLOPramide (REGLAN) 5 MG tablet Take 5 mg by mouth 4 (four) times daily - after meals and at bedtime.    . metoprolol tartrate (LOPRESSOR) 25 MG tablet Take 0.5 tablets (12.5 mg total) by mouth 2 (two) times daily. 60 tablet 0  . midodrine (PROAMATINE) 10 MG tablet Take 20 mg by mouth every Monday, Wednesday, and Friday. On dialysis days    . multivitamin (RENA-VIT) TABS tablet Take 1 tablet by mouth daily.    . nitroGLYCERIN (NITROSTAT) 0.4 MG SL tablet Place 0.4 mg under the tongue every 5 (five) minutes as needed for chest pain.     . Nutritional Supplements (FEEDING SUPPLEMENT, NEPRO CARB STEADY,) LIQD Take 237 mLs by mouth daily. 30 Can 0  . omeprazole (PRILOSEC) 20 MG capsule Take 20 mg  by mouth daily.    [name] Kitchen oxymetazoline (AFRIN) 0.05 % nasal spray Place 1 spray into both nostrils 2 (two) times daily. (Patient not taking: Reported on 01/19/2016) 30 mL 0  . predniSONE (DELTASONE) 20 MG tablet Take 2 tablets (40 mg total) by mouth daily. (Patient not taking: Reported on 01/19/2016) 8 tablet 0  . promethazine (PHENERGAN) 25 MG tablet Take 1 tablet (25 mg total) by mouth every 6 (six) hours as needed for nausea or vomiting. For nausea 30 tablet 0  . rOPINIRole (REQUIP) 2 MG tablet Take 2 mg by mouth daily with supper.     . ticagrelor (BRILINTA) 90 MG TABS tablet Take 1 tablet (90 mg total) by mouth 2 (two) times daily. Please contact cardiology Dr  Camillia Herter office for prior authorization needs or refills. 60 tablet 0      Allergies:   Contrast media [iodinated diagnostic agents]; Iodides; Ioxaglate; Rocephin [ceftriaxone]; Amoxicillin; Buprenorphine hcl; Clopidogrel; Enalapril; Hydrocodone; Morphine; Morphine and related; Adhesive [tape]; Prednisone; Ciprofloxacin; Clindamycin/lincomycin; Codeine; Doxycycline; Enalaprilat; Levofloxacin; Lincomycin hcl; Mobic [meloxicam]; Oxycodone; Plavix [clopidogrel bisulfate]; and Vasotec   Social History   Social History  . Marital status: Married    Spouse name: N/A  . Number of children: N/A  . Years of education: N/A   Occupational History  . Disabled    Social History Main Topics  . Smoking status: Never Smoker  . Smokeless tobacco: Never Used  . Alcohol use Yes  . Drug use: No  . Sexual activity: Not on file   Other Topics Concern  . Not on  file   Social History Narrative   Lives @ home with his wife in [place].      Family History:  [name] ***family history includes Breast cancer in his maternal aunt; Cancer in his maternal aunt; Colon polyps in his father; Diabetes in his father and mother; Hyperlipidemia in his mother; Hypertension in his father and mother.   ROS:   Please see the history of present illness.     ROS All other systems reviewed and are negative.   EKGs/Labs/Other Test Reviewed:    EKG:  EKG is *** ordered today.  The ekg ordered today demonstrates ***  Recent Labs: 02/25/2015: B Natriuretic Peptide 444.3 12/24/2015: TSH 2.489 01/04/2016: Magnesium 2.1 01/19/2016: ALT 14; BUN 16; Creatinine, Ser 7.89; Hemoglobin 9.1; Platelets 319; Potassium 4.2; Sodium 132   Recent Lipid Panel    Component Value Date/Time   CHOL 143 02/25/2015 1400   TRIG 255 (H) 02/25/2015 1400   HDL 29 (L) 02/25/2015 1400   CHOLHDL 4.9 02/25/2015 1400   VLDL 51 (H) 02/25/2015 1400   LDLCALC 63 02/25/2015 1400     Physical Exam:    VS:  There were no vitals taken for this visit.    Wt Readings from Last 3 Encounters:  01/12/16 278 lb (126.1 kg)  01/06/16 280 lb (127 kg)  01/04/16 281 lb 8.4 oz (127.7 kg)     ***Physical Exam  ASSESSMENT:    No diagnosis found. PLAN:    In order of problems listed above:  2. CAD - No angina. Myoview 11/16 low risk with normal perfusion.  Low BP has limited use of beta-blocker.  BP is now higher and he is on a good dose of Toprol-XL. Continue ASA, Effient, statin.  He is > 12 mos post PCI with DES to the RCA.  As noted above, he may hold his Effient 7 days prior to his surgery and resume post op when it is safe from a surgical standpoint. He is allergic to Plavix.  I will review with Dr. Lauree Chandler to decide if we should keep him on ASA + Effient long term vs ASA alone.    3. Chronic diastolic CHF -  Volume management per dialysis.   4. ESRD (end stage renal disease): He is on MWF dialysis.  5. Essential hypertension -  BP controlled.  Continue current Rx.   6. HLD (hyperlipidemia): Continue statin.   Medication Adjustments/Labs and Tests Ordered: Current medicines are reviewed at length with the patient today.  Concerns regarding medicines are outlined above.  Medication changes, Labs and Tests ordered today are outlined in the Patient  Instructions noted below. There are no Patient Instructions on file for this visit. Signed, Richardson Dopp, PA-C  01/19/2016 10:53 PM    [facility] [place], [place], [place]  [address] Phone: [phone]; Fax: [phone]

## 2016-01-19 NOTE — ED Triage Notes (Addendum)
Pt here with n/v since Saturday. Can't keep anything down, has been retching. Was admitted 8/28 for 2 weeks, HGB 6, went dialysis and coded there. Had endoscopy that didn't show any blood. Given 2 units of blood. Since then has been vomiting. Last dialysis was yesterday. Goes on M, W, F. Told to go to GI doctor, but can't get seen until next week. Denies any frank blood in stool or emesis. Reports generalized body aches all over, is a x 4. Denies cp.

## 2016-01-19 NOTE — ED Notes (Signed)
Attempted report x1. 

## 2016-01-19 NOTE — ED Provider Notes (Signed)
[facility] DEPT Provider Note   CSN: [id number] Arrival date & time: 01/19/16  1352     History   Chief Complaint Chief Complaint  Patient presents with  . Nausea  . Emesis    HPI Dale [name] is a 50 y.o. male.  Patient is a 50 year old male with past medical history of DM, ESRD (on hemodialysis M/W/F), CAD, diastolic CHF (EF 36-62%), fibromyalgia, GERD, HTN, HLD, OSA who presents to the ED with complaint of nausea. Patient states he was admitted to the hospital on 8/28 for anemia due to GI bleed. He states he has been following up with his PCP since being discharged and notes last week his hemoglobin had decreased resulting in him being sent to [facility]. Patient states he was admitted to [facility] on 9/21 for anemia, was given 2U of blood and d/c home on 9/23. Patient reports since being home he has had nausea with dry heaving, denies any episodes of vomiting. Endorses associated nonproductive cough, weakness, decreased appetite. Patient states he has been going to dialysis as scheduled, last treatment yesterday. He notes while he was waiting in the lobby this afternoon he began to feel like his belly was bloated/more distended but denies any pain. He states he has had an intermittent fever at home over the past few days. Denies headache, lightheadedness, dizziness, nasal congestion, rhinorrhea, hemoptysis, wheezing, shortness of breath, chest pain, vomiting, diarrhea, constipation, rectal bleeding, melena. Patient reports he does not make urine. Hx of abdominal surgeries include peritoneal cath, umbilical hernia repair, and appendectomy.      Past Medical History:  Diagnosis Date  . Anemia of chronic disease   . Arthritis    "hands, right knee" (03/19/2014)  . CAD (coronary artery disease)    a. Lexiscan Myoview (11/15):  Inf, apical cap and apical lateral ischemia, EF 56%, inf HK, Moderate Risk;  b. LHC (11/15):  mid to dist Dx 80%, mid RCA 99% (functional CTO),  dist RCA 80% >> PCI: balloon angioplasty to mid RCA (could not deliver stent) - plan staged PCI with HSRA of RCA; Dx to be tx medically >> PCI: Rotoblator atherectomy/DES to [facility]  . Cholecystitis    a. 08/27/2011  . Chronic diastolic CHF (congestive heart failure) ([place])    a. Echo 3/13:  EF 55-60%;  b. Echo (11/15):  Mild LVH, EF 60-65%, no RWMA, Gr 1 DD, MAC, mild LAE, normal RVF, mild RAE;  c.  Echo 5/16:  severe LVH, EF 55-60%, no RWMA, Gr 1 DD, MAC, mild LAE  . Claustrophobia    when things get around his face.   . Difficult intubation    only once in 2015 at [facility] haven't had a problem since then  . ESRD (end stage renal disease) ([place])    a. 1995 s/p cadaveric transplant w/ susbequent failure after 18 yrs;  b.Dialysis initiated 07/2011 [facility] M-W-F  . Fibromyalgia   . GERD (gastroesophageal reflux disease)   . Gout    PMH  . History of blood transfusion   . HLD (hyperlipidemia)   . Hypertension   . Hypothyroidism   . Inguinal lymphadenopathy    a. bilateral - s/p biopsy 07/2011  . Insulin dependent diabetes mellitus (HCC)    Type I  . OSA (obstructive sleep apnea)    adjustable bed  . Pericardial effusion    a.  Small by CT 08/22/11;  b.  Large by CT 08/27/11  . Pneumonia ~ 2007  Patient Active Problem List   Diagnosis Date Noted  . Stridor   . Iron deficiency anemia   . Encounter for central line placement   . Gastrointestinal hemorrhage with melena 12/21/2015  . Encounter for intubation   . Acute blood loss anemia   . Uremia   . Acute encephalopathy   . Troponin level elevated   . Pulmonary edema 03/09/2015  . Fluid overload 03/09/2015  . Chronic pain 03/09/2015  . Hyperkalemia 03/09/2015  . Hypoglycemia 03/09/2015  . Diabetes mellitus ([place]) 03/09/2015  . Chest pain at rest   . Accelerated hypertension 02/24/2015  . DM (diabetes mellitus) type II controlled with renal manifestation ([place]) 02/24/2015  . Acute pulmonary edema ([place]) 01/21/2015  . Acute  dyspnea 01/18/2015  . Volume overload 01/18/2015  . HLD (hyperlipidemia) 11/27/2014  . Hypoxia 09/30/2014  . Dizziness 09/30/2014  . Hypotension 09/30/2014  . ESRD on dialysis ([place]) 09/30/2014  . Coronary artery disease involving native coronary artery of native heart with unstable angina pectoris ([place]) 03/20/2014  . Allergic reaction to dye 03/20/2014  . NSTEMI (non-ST elevated myocardial infarction) ([place]) 03/19/2014  . HTN (hypertension) 02/18/2014  . Influenza with other respiratory manifestations 06/17/2013  . Acute respiratory failure with hypoxia ([place]) 06/16/2013  . Tracheobronchitis 06/16/2013  . Salmonella bacteremia 07/10/2012  . Salmonella gastroenteritis 07/10/2012  . Acute respiratory failure ([place]) 07/04/2012  . Septic shock ([place]) 07/04/2012  . Metabolic acidosis 13/24/4010  . Fever and chills 07/03/2012  . Diarrhea 07/03/2012  . Nausea 07/03/2012  . Leukocytosis 07/03/2012  . AKI (acute kidney injury) ([place]) 07/03/2012  . Pain in the chest 06/18/2012  . Swelling of arm 03/19/2012  . Colitis, ischemic ([place]) 08/31/2011  . ESRD (end stage renal disease) ([place]) 08/27/2011  . Abdominal pain 08/27/2011  . Pericardial effusion 08/27/2011  . Constipation, acute 08/23/2011  . History of renal transplant 07/22/2011  . S/P BKA (below knee amputation) ([place]) 07/22/2011  . Obstructive sleep apnea 07/22/2011  . Dyspnea 07/22/2011  . Chronic diastolic CHF (congestive heart failure) ([place]) 07/22/2011  . Anemia 07/22/2011  . Diabetes mellitus with nephropathy ([place]) 07/22/2011    Past Surgical History:  Procedure Laterality Date  . AMPUTATION OF REPLICATED TOES Right   . ANGIOPLASTY  04/09/2014   RCA   . APPENDECTOMY  ~ 2006  . ARTERIOVENOUS GRAFT PLACEMENT Left 1993?   forearm  . ARTERIOVENOUS GRAFT PLACEMENT Right 1993?   leg  . ARTERIOVENOUS GRAFT PLACEMENT Left    Thigh  . Arteriovenous Graft Removed Left    Thigh  . AV FISTULA PLACEMENT  07/29/2011   Procedure:  ARTERIOVENOUS (AV) FISTULA CREATION;  Surgeon: Mal Misty, MD;  Location: [place];  Service: Vascular;  Laterality: Right;  Brachial cephalic  . AV FISTULA PLACEMENT Left 01/20/2015   Procedure: LIGATION OF RIGHT ARTERIOVENOUS FISTULA WITH EXCISION OF ANEURYSM;  Surgeon: Angelia Mould, MD;  Location: [place];  Service: Vascular;  Laterality: Left;  . AV FISTULA PLACEMENT Left 04/30/2015   Procedure: CREATION OF LEFT ARM BRACHIO-CEPHALIC ARTERIOVENOUS (AV) FISTULA ;  Surgeon: Angelia Mould, MD;  Location: [place];  Service: Vascular;  Laterality: Left;  . AV Graft PLACEMENT Left 1993?   "attempted one in my wrist; didn't take"  . BELOW KNEE LEG AMPUTATION Left 2010  . CARDIAC CATHETERIZATION  03/19/2014  . CARDIAC CATHETERIZATION  03/19/2014   Procedure: CORONARY BALLOON ANGIOPLASTY;  Surgeon: Burnell Blanks, MD;  Location: [facility] CATH LAB;  Service: Cardiovascular;;  . CARDIAC CATHETERIZATION  N/A 01/01/2016   Procedure: Left Heart Cath and Coronary Angiography;  Surgeon: Troy Sine, MD;  Location: [place] CV LAB;  Service: Cardiovascular;  Laterality: N/A;  . CARDIAC CATHETERIZATION N/A 01/01/2016   Procedure: Coronary Stent Intervention;  Surgeon: Troy Sine, MD;  Location: [place] CV LAB;  Service: Cardiovascular;  Laterality: N/A;  . CARPAL TUNNEL RELEASE Bilateral after 2005  . CATARACT EXTRACTION W/ INTRAOCULAR LENS  IMPLANT, BILATERAL Bilateral 1990's  . COLONOSCOPY  08/29/2011   Procedure: COLONOSCOPY;  Surgeon: Jerene Bears, MD;  Location: [place];  Service: Gastroenterology;  Laterality: N/A;  . CORONARY ANGIOPLASTY WITH STENT PLACEMENT  04/09/2014       ptca/des mid lad   . ESOPHAGOGASTRODUODENOSCOPY N/A 12/25/2015   Procedure: ESOPHAGOGASTRODUODENOSCOPY (EGD);  Surgeon: Mauri Pole, MD;  Location: [facility] ENDOSCOPY;  Service: Endoscopy;  Laterality: N/A;  bedside  . EYE SURGERY Bilateral    cataract  . FEMORAL ARTERY REPAIR  04/09/2014   ANGIOSEAL    . INSERTION OF DIALYSIS CATHETER  08/01/2011   Procedure: INSERTION OF DIALYSIS CATHETER;  Surgeon: Rosetta Posner, MD;  Location: [place];  Service: Vascular;  Laterality: Right;  insertion of dialysis catheter on right internal jugular vein  . INSERTION OF DIALYSIS CATHETER Right 01/20/2015   Procedure: INSERTION OF DIALYSIS CATHETER - RIGHT INTERNAL JUGULAR ;  Surgeon: Angelia Mould, MD;  Location: [place];  Service: Vascular;  Laterality: Right;  . KIDNEY TRANSPLANT  04/25/1993  . LEFT HEART CATHETERIZATION WITH CORONARY ANGIOGRAM N/A 03/19/2014   Procedure: LEFT HEART CATHETERIZATION WITH CORONARY ANGIOGRAM;  Surgeon: Burnell Blanks, MD;  Location: [facility] CATH LAB;  Service: Cardiovascular;  Laterality: N/A;  . LYMPH NODE BIOPSY  08/10/2011   Procedure: LYMPH NODE BIOPSY;  Surgeon: Merrie Roof, MD;  Location: [place];  Service: General;  Laterality: Left;  left groin lymph biopsy  . NEUROPLASTY / TRANSPOSITION ULNAR NERVE AT ELBOW Left after 2005  . PERCUTANEOUS CORONARY ROTOBLATOR INTERVENTION (PCI-R) N/A 04/09/2014   Procedure: PERCUTANEOUS CORONARY ROTOBLATOR INTERVENTION (PCI-R);  Surgeon: Burnell Blanks, MD;  Location: [facility] CATH LAB;  Service: Cardiovascular;  Laterality: N/A;  . PERIPHERAL VASCULAR CATHETERIZATION N/A 12/25/2014   Procedure: Upper Extremity Venography;  Surgeon: Conrad [place] Plymouth, MD;  Location: [place] CV LAB;  Service: Cardiovascular;  Laterality: N/A;  . PERITONEAL CATHETER INSERTION    . PERITONEAL CATHETER REMOVAL    . REVISON OF ARTERIOVENOUS FISTULA Right 6/96/7893   Procedure: PLICATION / REVISION OF ARTERIOVENOUS FISTULA;  Surgeon: Angelia Mould, MD;  Location: [place];  Service: Vascular;  Laterality: Right;  . REVISON OF ARTERIOVENOUS FISTULA Left 09/01/2015   Procedure: SUPERFICIALIZATION OF LEFT ARM BRACHIOCEPHALIC ARTERIOVENOUS FISTULA;  Surgeon: Angelia Mould, MD;  Location: [place];  Service: Vascular;  Laterality: Left;  . SHOULDER  ARTHROSCOPY Left ~ 2006   frozen  . TOE AMPUTATION Bilateral after 2005  . UMBILICAL HERNIA REPAIR  1990's  . VENOGRAM Right 07/28/2011   Procedure: VENOGRAM;  Surgeon: Conrad Plymouth, MD;  Location: [facility] CATH LAB;  Service: Cardiovascular;  Laterality: Right;  . VITRECTOMY Bilateral        Home Medications    Prior to Admission medications   Medication Sig Start Date End Date Taking? Authorizing Provider  acetaminophen (TYLENOL) 500 MG tablet Take 1,000 mg by mouth every 6 (six) hours as needed for moderate pain or headache.    Historical Provider, MD  allopurinol (ZYLOPRIM) 100 MG tablet  Take 200 mg by mouth daily.  03/14/12   Historical Provider, MD  aspirin EC 81 MG tablet Take 81 mg by mouth at bedtime.    Historical Provider, MD  atorvastatin (LIPITOR) 80 MG tablet Take 1 tablet (80 mg total) by mouth daily at 6 PM. 01/04/16   Florencia Reasons, MD  benzonatate (TESSALON) 100 MG capsule Take 1 capsule (100 mg total) by mouth every 8 (eight) hours. 01/07/16   Carlisle Cater, PA-C  calcium acetate (PHOSLO) 667 MG capsule Take 1,334-2,001 mg by mouth See admin instructions. Take 3 capsules (2001 mg) by mouth with meals and 2 capsule (1334mg  ) with snacks 09/19/12   Historical Provider, MD  diphenhydrAMINE (BENADRYL) 25 mg capsule Take 1-2 capsules (25-50 mg total) by mouth every 6 (six) hours as needed for itching. Patient taking differently: Take 50 mg by mouth 2 (two) times daily as needed for itching or sleep.  04/11/14   Evelene Croon Barrett, PA-C  fenofibrate (TRICOR) 48 MG tablet Take 1 tablet (48 mg total) by mouth daily. 02/27/15   Jonetta Osgood, MD  Ferric Citrate (AURYXIA) 1 GM 210 MG(Fe) TABS Take 2 g by mouth 3 (three) times daily with meals.    Historical Provider, MD  gabapentin (NEURONTIN) 300 MG capsule Take 300 mg by mouth 3 (three) times daily.    Historical Provider, MD  GLUCAGON EMERGENCY 1 MG injection Inject 1 mg into the muscle daily as needed (low blood sugar).  01/19/12   Historical  Provider, MD  HYDROmorphone (DILAUDID) 2 MG tablet Take 1 tablet (2 mg total) by mouth 3 (three) times daily as needed for severe pain. Patient taking differently: Take 2 mg by mouth 2 (two) times daily. Take 1 tablet (2 mg) by mouth daily at bedtime, may take an additional dose during the day as needed for severe pain 11/20/14   Alvia Grove, PA-C  ibuprofen (ADVIL,MOTRIN) 200 MG tablet Take 400 mg by mouth 2 (two) times daily as needed for moderate pain.    Historical Provider, MD  Insulin Human (INSULIN PUMP) 100 unit/ml SOLN Inject 1 each into the skin continuous. Humalog    Historical Provider, MD  insulin lispro (HUMALOG) 100 UNIT/ML injection Inject 175 Units into the skin continuous. Up to 175 units continuous in pump,  Basal rates area as follows:  2400 1.5 units/hr, 0300 1.9 units/hr, 0800 1.45 units/hr, 1200 1.625 units/hr    Historical Provider, MD  levothyroxine (SYNTHROID, LEVOTHROID) 100 MCG tablet Take 100 mcg by mouth at bedtime.     Historical Provider, MD  loratadine (CLARITIN) 10 MG tablet Take 10 mg by mouth daily.    Historical Provider, MD  loratadine-pseudoephedrine (CLARITIN-D 12-HOUR) 5-120 MG tablet Take 1 tablet by mouth 2 (two) times daily.    Historical Provider, MD  metoCLOPramide (REGLAN) 5 MG tablet Take 5 mg by mouth 4 (four) times daily - after meals and at bedtime. 05/26/11   Historical Provider, MD  metoprolol tartrate (LOPRESSOR) 25 MG tablet Take 0.5 tablets (12.5 mg total) by mouth 2 (two) times daily. 01/04/16   Florencia Reasons, MD  midodrine (PROAMATINE) 10 MG tablet Take 20 mg by mouth every Monday, Wednesday, and Friday. On dialysis days    Historical Provider, MD  multivitamin (RENA-VIT) TABS tablet Take 1 tablet by mouth daily.    Historical Provider, MD  nitroGLYCERIN (NITROSTAT) 0.4 MG SL tablet Place 0.4 mg under the tongue every 5 (five) minutes as needed for chest pain.  [name], MD  Nutritional Supplements (FEEDING SUPPLEMENT, NEPRO CARB STEADY,)  LIQD Take 237 mLs by mouth daily. 01/04/16   Florencia Reasons, MD  omeprazole (PRILOSEC) 20 MG capsule Take 20 mg by mouth daily.    Historical Provider, MD  oxymetazoline (AFRIN) 0.05 % nasal spray Place 1 spray into both nostrils 2 (two) times daily. Patient not taking: Reported on 01/12/2016 01/04/16   Florencia Reasons, MD  predniSONE (DELTASONE) 20 MG tablet Take 2 tablets (40 mg total) by mouth daily. Patient not taking: Reported on 01/12/2016 01/07/16   Carlisle Cater, PA-C  promethazine (PHENERGAN) 25 MG tablet Take 1 tablet (25 mg total) by mouth every 6 (six) hours as needed for nausea or vomiting. For nausea 11/27/14   Liliane Shi, PA-C  rOPINIRole (REQUIP) 2 MG tablet Take 2 mg by mouth daily with supper.     Historical Provider, MD  ticagrelor (BRILINTA) 90 MG TABS tablet Take 1 tablet (90 mg total) by mouth 2 (two) times daily. Please contact cardiology Dr  Camillia Herter office for prior authorization needs or refills. 01/04/16   Florencia Reasons, MD    Family History Family History  Problem Relation Age of Onset  . Colon polyps Father   . Diabetes Father   . Hypertension Father   . Hypertension Mother   . Diabetes Mother   . Hyperlipidemia Mother   . Breast cancer Maternal Aunt   . Cancer Maternal Aunt     Breast and Bone  . Malignant hyperthermia Neg Hx   . Heart attack Neg Hx   . Stroke Neg Hx     Social History Social History  Substance Use Topics  . Smoking status: Never Smoker  . Smokeless tobacco: Never Used  . Alcohol use Yes     Allergies   Contrast media [iodinated diagnostic agents]; Iodides; Ioxaglate; Rocephin [ceftriaxone]; Amoxicillin; Buprenorphine hcl; Clopidogrel; Enalapril; Hydrocodone; Morphine; Morphine and related; Adhesive [tape]; Prednisone; Ciprofloxacin; Clindamycin/lincomycin; Codeine; Doxycycline; Enalaprilat; Levofloxacin; Lincomycin hcl; Mobic [meloxicam]; Oxycodone; Plavix [clopidogrel bisulfate]; and Vasotec   Review of Systems Review of Systems  Constitutional:  Positive for appetite change (decreased) and fever.  Respiratory: Positive for cough.   Cardiovascular: Positive for leg swelling (chronic, unchanged).  Gastrointestinal: Positive for abdominal distention and nausea.  Neurological: Positive for weakness.  All other systems reviewed and are negative.    Physical Exam Updated Vital Signs BP 162/83   Pulse 100   Temp 98.4 F (36.9 C) (Oral)   Resp 24   SpO2 95%   Physical Exam  Constitutional: He is oriented to person, place, and time. He appears well-developed and well-nourished. No distress.  Morbidly obese male, dry heaving on exam.  HENT:  Head: Normocephalic and atraumatic.  Mouth/Throat: Uvula is midline, oropharynx is clear and moist and mucous membranes are normal. No oropharyngeal exudate, posterior oropharyngeal edema, posterior oropharyngeal erythema or tonsillar abscesses. No tonsillar exudate.  Eyes: Conjunctivae and EOM are normal. Right eye exhibits no discharge. Left eye exhibits no discharge. No scleral icterus.  Neck: Normal range of motion. Neck supple.  Cardiovascular: Normal rate, regular rhythm, normal heart sounds and intact distal pulses.   Pulmonary/Chest: Effort normal. No respiratory distress. He has wheezes (mild end-expiratory wheezing bilaterally). He has no rales. He exhibits no tenderness.  Abdominal: Soft. Bowel sounds are normal. He exhibits no distension and no mass. There is no tenderness. There is no rebound and no guarding. No hernia.  Protuberant abdomen with mild pitting edema noted to lower abdomen  Musculoskeletal: Normal range of motion. He exhibits edema.  Left BKA. 2+ pitting edema noted to RLE that extends to proximal shin, pt reports is chronic and unchanged.  Neurological: He is alert and oriented to person, place, and time.  Skin: Skin is warm and dry. He is not diaphoretic.  Nursing note and vitals reviewed.    ED Treatments / Results  Labs (all labs ordered are listed, but only  abnormal results are displayed) Labs Reviewed  COMPREHENSIVE METABOLIC PANEL - Abnormal; Notable for the following:       Result Value   Sodium 132 (*)    Chloride 93 (*)    Creatinine, Ser 7.89 (*)    Calcium 8.5 (*)    Albumin 2.8 (*)    ALT 14 (*)    GFR calc non Af Amer 7 (*)    GFR calc Af Amer 8 (*)    All other components within normal limits  CBC - Abnormal; Notable for the following:    RBC 3.13 (*)    Hemoglobin 9.1 (*)    HCT 29.3 (*)    RDW 16.6 (*)    All other components within normal limits  LIPASE, BLOOD  I-STAT CG4 LACTIC ACID, ED  I-STAT TROPOININ, ED    EKG  EKG Interpretation  Date/Time:  Tuesday January 19 2016 18:58:18 EDT Ventricular Rate:  96 PR Interval:    QRS Duration: 90 QT Interval:  378 QTC Calculation: 478 R Axis:   67 Text Interpretation:  Sinus rhythm Ventricular premature complex Borderline prolonged QT interval Baseline wander in lead(s) V1 Confirmed by Hazle Coca [phone]) on 01/19/2016 7:00:49 PM       Radiology No results found.  Procedures Procedures (including critical care time)  Medications Ordered in ED Medications  promethazine (PHENERGAN) injection 12.5 mg (12.5 mg Intravenous Given 01/19/16 1845)     Initial Impression / Assessment and Plan / ED Course  I have reviewed the triage vital signs and the nursing notes.  Pertinent labs & imaging results that were available during my care of the patient were reviewed by me and considered in my medical decision making (see chart for details).  Clinical Course   Patient presents with nausea, nonproductive cough, weakness and decreased appetite over the past 4 days since being d/c from the hospital. He notes he was admitted to [facility] on 9/21 for anemia, was given 2U blood and d/c home. Denies fever, abdominal pain, vomiting, rectal bleeding. Hx of ESRD on HD, CAD s/p DES placement, CHF, HTN, HLD. VSS. Exam revealed mild end-expiratory wheezes bilaterally, no signs  of respiratory distress, RRR. Protuberant abdomen with mild pitting edema noted to lower abdomen, nontender. LE pitting edema noted, which pt reports is chronic and unchanged. Pt given antiemetics, requested phenergan due to zofran not working for him in the past.   Chart review shows during pt's admission on 8/28 for left arm pain, he was noted to be hyperkalemic with elevated BUN and Cr and hgb 6.8. While pt was receiving HD after being admitted he was emergently intubated du to acute hypoxic respiratory failure. Pt also had NSTEMI, suspected from demand ischemia d/t GI bleed and had cardiac cath with DES on 9/8 and started on DAPT. EF 65%. Endoscopy preformed did not reveal a source of bleeding.   Discussed case with Dr. Ralene Bathe who evaluated the pt in the ED. Labs, CXR and CT abdomen ordered for further evaluation. Ordered EKG and trop for cardiac evaluation. Hgb 9.1.  Na 132. Cr 7.89. Lactic 1.06. EKG showed sinus rhythm with PVCs, no acute ischemic changes.  Hand-off to Dr. Ralene Bathe. Pending imaging studies.   Final Clinical Impressions(s) / ED Diagnoses   Final diagnoses:  None    New Prescriptions New Prescriptions   No medications on file     Nona Dell, PA-C 01/19/16 2013    Quintella Reichert, MD 01/22/16 2251

## 2016-01-20 ENCOUNTER — Ambulatory Visit: Payer: Medicare Other | Admitting: Physician Assistant

## 2016-01-20 ENCOUNTER — Observation Stay (HOSPITAL_BASED_OUTPATIENT_CLINIC_OR_DEPARTMENT_OTHER): Payer: Medicare Other

## 2016-01-20 ENCOUNTER — Telehealth: Payer: Self-pay | Admitting: Cardiovascular Disease

## 2016-01-20 DIAGNOSIS — N186 End stage renal disease: Secondary | ICD-10-CM | POA: Diagnosis not present

## 2016-01-20 DIAGNOSIS — M7989 Other specified soft tissue disorders: Secondary | ICD-10-CM | POA: Diagnosis not present

## 2016-01-20 DIAGNOSIS — E1121 Type 2 diabetes mellitus with diabetic nephropathy: Secondary | ICD-10-CM | POA: Diagnosis not present

## 2016-01-20 DIAGNOSIS — K3184 Gastroparesis: Secondary | ICD-10-CM | POA: Diagnosis not present

## 2016-01-20 DIAGNOSIS — I5032 Chronic diastolic (congestive) heart failure: Secondary | ICD-10-CM | POA: Diagnosis not present

## 2016-01-20 DIAGNOSIS — E1143 Type 2 diabetes mellitus with diabetic autonomic (poly)neuropathy: Secondary | ICD-10-CM | POA: Diagnosis not present

## 2016-01-20 DIAGNOSIS — R1110 Vomiting, unspecified: Secondary | ICD-10-CM | POA: Diagnosis not present

## 2016-01-20 LAB — CBC
HCT: 26.8 % — ABNORMAL LOW (ref 39.0–52.0)
Hemoglobin: 8.3 g/dL — ABNORMAL LOW (ref 13.0–17.0)
MCH: 29 pg (ref 26.0–34.0)
MCHC: 31 g/dL (ref 30.0–36.0)
MCV: 93.7 fL (ref 78.0–100.0)
Platelets: 325 10*3/uL (ref 150–400)
RBC: 2.86 MIL/uL — ABNORMAL LOW (ref 4.22–5.81)
RDW: 16.5 % — ABNORMAL HIGH (ref 11.5–15.5)
WBC: 5.1 10*3/uL (ref 4.0–10.5)

## 2016-01-20 LAB — LACTIC ACID, PLASMA: Lactic Acid, Venous: 1.1 mmol/L (ref 0.5–1.9)

## 2016-01-20 LAB — GLUCOSE, CAPILLARY
Glucose-Capillary: 114 mg/dL — ABNORMAL HIGH (ref 65–99)
Glucose-Capillary: 75 mg/dL (ref 65–99)
Glucose-Capillary: 102 mg/dL — ABNORMAL HIGH (ref 65–99)
Glucose-Capillary: 114 mg/dL — ABNORMAL HIGH (ref 65–99)
Glucose-Capillary: 163 mg/dL — ABNORMAL HIGH (ref 65–99)

## 2016-01-20 LAB — COMPREHENSIVE METABOLIC PANEL
ALT: 12 U/L — ABNORMAL LOW (ref 17–63)
AST: 24 U/L (ref 15–41)
Albumin: 2.5 g/dL — ABNORMAL LOW (ref 3.5–5.0)
Alkaline Phosphatase: 108 U/L (ref 38–126)
Anion gap: 13 (ref 5–15)
BUN: 22 mg/dL — ABNORMAL HIGH (ref 6–20)
CO2: 26 mmol/L (ref 22–32)
Calcium: 8.4 mg/dL — ABNORMAL LOW (ref 8.9–10.3)
Chloride: 94 mmol/L — ABNORMAL LOW (ref 101–111)
Creatinine, Ser: 9.64 mg/dL — ABNORMAL HIGH (ref 0.61–1.24)
GFR calc Af Amer: 6 mL/min — ABNORMAL LOW (ref >60)
GFR calc non Af Amer: 6 mL/min — ABNORMAL LOW (ref >60)
Glucose, Bld: 108 mg/dL — ABNORMAL HIGH (ref 65–99)
Potassium: 5.2 mmol/L — ABNORMAL HIGH (ref 3.5–5.1)
Sodium: 133 mmol/L — ABNORMAL LOW (ref 135–145)
Total Bilirubin: 0.6 mg/dL (ref 0.3–1.2)
Total Protein: 6.5 g/dL (ref 6.5–8.1)

## 2016-01-20 LAB — TROPONIN I: Troponin I: 0.04 ng/mL (ref <0.03)

## 2016-01-20 MED ORDER — DARBEPOETIN ALFA 200 MCG/0.4ML IJ SOSY
200.0000 ug | PREFILLED_SYRINGE | INTRAMUSCULAR | Status: DC
  Administered 2016-01-20: 200 ug via INTRAVENOUS

## 2016-01-20 MED ORDER — CALCIUM ACETATE (PHOS BINDER) 667 MG PO CAPS: 1334.0000 mg | ORAL_CAPSULE | ORAL | Status: DC | PRN

## 2016-01-20 MED ORDER — INSULIN ASPART 100 UNIT/ML ~~LOC~~ SOLN
0.0000 [IU] | Freq: Three times a day (TID) | SUBCUTANEOUS | Status: DC
  Administered 2016-01-21: 3 [IU] via SUBCUTANEOUS

## 2016-01-20 MED ORDER — TRAMADOL HCL 50 MG PO TABS
100.0000 mg | ORAL_TABLET | Freq: Once | ORAL | Status: DC
  Filled 2016-01-20: qty 2

## 2016-01-20 MED ORDER — INFLUENZA VAC SPLIT QUAD 0.5 ML IM SUSY
0.5000 mL | PREFILLED_SYRINGE | INTRAMUSCULAR | Status: AC
  Administered 2016-01-21: 0.5 mL via INTRAMUSCULAR
  Filled 2016-01-20 (×2): qty 0.5

## 2016-01-20 MED ORDER — PROMETHAZINE HCL 25 MG/ML IJ SOLN
INTRAMUSCULAR | Status: AC
  Administered 2016-01-20: 12.5 mg via INTRAVENOUS
  Filled 2016-01-20: qty 1

## 2016-01-20 MED ORDER — ALBUTEROL SULFATE (2.5 MG/3ML) 0.083% IN NEBU
3.0000 mL | INHALATION_SOLUTION | RESPIRATORY_TRACT | Status: DC | PRN
  Administered 2016-01-21: 3 mL via RESPIRATORY_TRACT
  Filled 2016-01-20: qty 3

## 2016-01-20 MED ORDER — INSULIN ASPART 100 UNIT/ML ~~LOC~~ SOLN: 0.0000 [IU] | Freq: Every day | SUBCUTANEOUS | Status: DC

## 2016-01-20 MED ORDER — LORATADINE 10 MG PO TABS
5.0000 mg | ORAL_TABLET | Freq: Two times a day (BID) | ORAL | Status: DC
  Administered 2016-01-20 – 2016-01-21 (×2): 5 mg via ORAL
  Filled 2016-01-20 (×4): qty 1

## 2016-01-20 MED ORDER — DARBEPOETIN ALFA 200 MCG/0.4ML IJ SOSY
PREFILLED_SYRINGE | INTRAMUSCULAR | Status: AC
  Administered 2016-01-20: 200 ug via INTRAVENOUS
  Filled 2016-01-20: qty 0.4

## 2016-01-20 MED ORDER — HYDROMORPHONE HCL 1 MG/ML IJ SOLN
INTRAMUSCULAR | Status: AC
  Administered 2016-01-20: 1 mg via INTRAVENOUS
  Filled 2016-01-20: qty 1

## 2016-01-20 MED ORDER — DOXERCALCIFEROL 4 MCG/2ML IV SOLN: 3.0000 ug | INTRAVENOUS | Status: DC

## 2016-01-20 MED ORDER — HYDROMORPHONE HCL 1 MG/ML IJ SOLN
0.5000 mg | Freq: Once | INTRAMUSCULAR | Status: AC
  Administered 2016-01-20: 0.5 mg via INTRAVENOUS
  Filled 2016-01-20: qty 1

## 2016-01-20 MED ORDER — NA FERRIC GLUC CPLX IN SUCROSE 12.5 MG/ML IV SOLN
125.0000 mg | Freq: Once | INTRAVENOUS | Status: AC
  Administered 2016-01-20: 125 mg via INTRAVENOUS
  Filled 2016-01-20 (×2): qty 10

## 2016-01-20 MED ORDER — SUCROFERRIC OXYHYDROXIDE 500 MG PO CHEW
1000.0000 mg | CHEWABLE_TABLET | Freq: Three times a day (TID) | ORAL | Status: DC
  Administered 2016-01-20: 1000 mg via ORAL
  Filled 2016-01-20 (×4): qty 2

## 2016-01-20 MED ORDER — METOCLOPRAMIDE HCL 5 MG/ML IJ SOLN
10.0000 mg | Freq: Four times a day (QID) | INTRAMUSCULAR | Status: DC
  Administered 2016-01-20 – 2016-01-21 (×4): 10 mg via INTRAVENOUS
  Filled 2016-01-20 (×7): qty 2

## 2016-01-20 MED ORDER — INSULIN GLARGINE 100 UNIT/ML ~~LOC~~ SOLN
10.0000 [IU] | Freq: Every day | SUBCUTANEOUS | Status: DC
  Administered 2016-01-20 – 2016-01-21 (×2): 10 [IU] via SUBCUTANEOUS
  Filled 2016-01-20 (×5): qty 0.1

## 2016-01-20 MED ORDER — PSEUDOEPHEDRINE HCL ER 120 MG PO TB12
120.0000 mg | ORAL_TABLET | Freq: Two times a day (BID) | ORAL | Status: DC
  Administered 2016-01-20 – 2016-01-21 (×2): 120 mg via ORAL
  Filled 2016-01-20 (×4): qty 1

## 2016-01-20 NOTE — Consult Note (Signed)
[facility] Renal Consultation Note  Indication for Consultation:  Management of ESRD/hemodialysis; anemia, hypertension/volume and secondary hyperparathyroidism  HPI: Dale [name] is a 50 y.o. male. ESRD  Sec DM / Ho failed cadaveric  renal [place]- 2013 Hd restarted  (HD  [facility] MWF, compliant with TXs) IDDM with pump, L BKA (uses Prosthetic leg),CAD sp PCI. Recent [facility] admit 8/28-9/11/17  Hyperkalemia / GI Bld ( nl Upper Endo)NSTEMI /acute Resp Failure Intubated  With Stridor/hoarsness . Now admitted with Intractable nausea and vomiting . He reports he has had intermittent Vomiting for years and is on Reglan QID longstanding for years / now he has intractable vomiting . He reports coughing  Since being Intubated last  Admit at [facility]. He was recently ar [facility]9/21-9/23/17  sent there for Blood transfusion by his PCP in [facility] saw an ENT MD for hoarseness / coughing  with  Nasal Scope exam and told his vocal chords were "okay". He reports low grade temps at home  Max 100.9 .  He denies chills on HD using his AVF. He denies SOB, Chest pain , does admit to deceased appetite "losing some weight" some mild constipation  but no abdominal pain or bloody stools.  CXR = There is mild bilateral interstitial thickening. There is no focalparenchymal opacity. There is no pleural effusion or pneumothorax./ Ct Abd= Constipation.  No bowel obstruction or active inflammation and Small left inguinal hematoma slightly increased in size compared toprior study. A pseudo aneurysm is not excluded       Past Medical History:  Diagnosis Date  . Anemia of chronic disease   . Arthritis    "hands, right knee" (03/19/2014)  . CAD (coronary artery disease)    a. Lexiscan Myoview (11/15):  Inf, apical cap and apical lateral ischemia, EF 56%, inf HK, Moderate Risk;  b. LHC (11/15):  mid to dist Dx 80%, mid RCA 99% (functional CTO), dist RCA 80% >> PCI: balloon angioplasty to mid RCA (could not  deliver stent) - plan staged PCI with HSRA of RCA; Dx to be tx medically >> PCI: Rotoblator atherectomy/DES to [facility]  . Cholecystitis    a. 08/27/2011  . Chronic diastolic CHF (congestive heart failure) ([place])    a. Echo 3/13:  EF 55-60%;  b. Echo (11/15):  Mild LVH, EF 60-65%, no RWMA, Gr 1 DD, MAC, mild LAE, normal RVF, mild RAE;  c.  Echo 5/16:  severe LVH, EF 55-60%, no RWMA, Gr 1 DD, MAC, mild LAE  . Claustrophobia    when things get around his face.   . Difficult intubation    only once in 2015 at [facility] haven't had a problem since then  . ESRD (end stage renal disease) ([place])    a. 1995 s/p cadaveric transplant w/ susbequent failure after 18 yrs;  b.Dialysis initiated 07/2011 [facility] M-W-F  . Fibromyalgia   . GERD (gastroesophageal reflux disease)   . Gout    PMH  . History of blood transfusion   . HLD (hyperlipidemia)   . Hypertension   . Hypothyroidism   . Inguinal lymphadenopathy    a. bilateral - s/p biopsy 07/2011  . Insulin dependent diabetes mellitus (HCC)    Type I  . OSA (obstructive sleep apnea)    adjustable bed  . Pericardial effusion    a.  Small by CT 08/22/11;  b.  Large by CT 08/27/11  . Pneumonia ~ 2007    Past Surgical  History:  Procedure Laterality Date  . AMPUTATION OF REPLICATED TOES Right   . ANGIOPLASTY  04/09/2014   RCA   . APPENDECTOMY  ~ 2006  . ARTERIOVENOUS GRAFT PLACEMENT Left 1993?   forearm  . ARTERIOVENOUS GRAFT PLACEMENT Right 1993?   leg  . ARTERIOVENOUS GRAFT PLACEMENT Left    Thigh  . Arteriovenous Graft Removed Left    Thigh  . AV FISTULA PLACEMENT  07/29/2011   Procedure: ARTERIOVENOUS (AV) FISTULA CREATION;  Surgeon: Mal Misty, MD;  Location: [place];  Service: Vascular;  Laterality: Right;  Brachial cephalic  . AV FISTULA PLACEMENT Left 01/20/2015   Procedure: LIGATION OF RIGHT ARTERIOVENOUS FISTULA WITH EXCISION OF ANEURYSM;  Surgeon: Angelia Mould, MD;  Location: [place];  Service: Vascular;  Laterality: Left;  . AV  FISTULA PLACEMENT Left 04/30/2015   Procedure: CREATION OF LEFT ARM BRACHIO-CEPHALIC ARTERIOVENOUS (AV) FISTULA ;  Surgeon: Angelia Mould, MD;  Location: [place];  Service: Vascular;  Laterality: Left;  . AV Graft PLACEMENT Left 1993?   "attempted one in my wrist; didn't take"  . BELOW KNEE LEG AMPUTATION Left 2010  . CARDIAC CATHETERIZATION  03/19/2014  . CARDIAC CATHETERIZATION  03/19/2014   Procedure: CORONARY BALLOON ANGIOPLASTY;  Surgeon: Burnell Blanks, MD;  Location: [facility] CATH LAB;  Service: Cardiovascular;;  . CARDIAC CATHETERIZATION N/A 01/01/2016   Procedure: Left Heart Cath and Coronary Angiography;  Surgeon: Troy Sine, MD;  Location: [place] CV LAB;  Service: Cardiovascular;  Laterality: N/A;  . CARDIAC CATHETERIZATION N/A 01/01/2016   Procedure: Coronary Stent Intervention;  Surgeon: Troy Sine, MD;  Location: [place] CV LAB;  Service: Cardiovascular;  Laterality: N/A;  . CARPAL TUNNEL RELEASE Bilateral after 2005  . CATARACT EXTRACTION W/ INTRAOCULAR LENS  IMPLANT, BILATERAL Bilateral 1990's  . COLONOSCOPY  08/29/2011   Procedure: COLONOSCOPY;  Surgeon: Jerene Bears, MD;  Location: [place];  Service: Gastroenterology;  Laterality: N/A;  . CORONARY ANGIOPLASTY WITH STENT PLACEMENT  04/09/2014       ptca/des mid lad   . ESOPHAGOGASTRODUODENOSCOPY N/A 12/25/2015   Procedure: ESOPHAGOGASTRODUODENOSCOPY (EGD);  Surgeon: Mauri Pole, MD;  Location: [facility] ENDOSCOPY;  Service: Endoscopy;  Laterality: N/A;  bedside  . EYE SURGERY Bilateral    cataract  . FEMORAL ARTERY REPAIR  04/09/2014   ANGIOSEAL  . INSERTION OF DIALYSIS CATHETER  08/01/2011   Procedure: INSERTION OF DIALYSIS CATHETER;  Surgeon: Rosetta Posner, MD;  Location: [place];  Service: Vascular;  Laterality: Right;  insertion of dialysis catheter on right internal jugular vein  . INSERTION OF DIALYSIS CATHETER Right 01/20/2015   Procedure: INSERTION OF DIALYSIS CATHETER - RIGHT INTERNAL JUGULAR ;   Surgeon: Angelia Mould, MD;  Location: [place];  Service: Vascular;  Laterality: Right;  . KIDNEY TRANSPLANT  04/25/1993  . LEFT HEART CATHETERIZATION WITH CORONARY ANGIOGRAM N/A 03/19/2014   Procedure: LEFT HEART CATHETERIZATION WITH CORONARY ANGIOGRAM;  Surgeon: Burnell Blanks, MD;  Location: [place] CATH LAB;  Service: Cardiovascular;  Laterality: N/A;  . LYMPH NODE BIOPSY  08/10/2011   Procedure: LYMPH NODE BIOPSY;  Surgeon: Merrie Roof, MD;  Location: [place];  Service: General;  Laterality: Left;  left groin lymph biopsy  . NEUROPLASTY / TRANSPOSITION ULNAR NERVE AT ELBOW Left after 2005  . PERCUTANEOUS CORONARY ROTOBLATOR INTERVENTION (PCI-R) N/A 04/09/2014   Procedure: PERCUTANEOUS CORONARY ROTOBLATOR INTERVENTION (PCI-R);  Surgeon: Burnell Blanks, MD;  Location: [facility] CATH LAB;  Service: Cardiovascular;  Laterality: N/A;  . PERIPHERAL VASCULAR CATHETERIZATION N/A 12/25/2014   Procedure: Upper Extremity Venography;  Surgeon: Conrad Morrow, MD;  Location: [place] CV LAB;  Service: Cardiovascular;  Laterality: N/A;  . PERITONEAL CATHETER INSERTION    . PERITONEAL CATHETER REMOVAL    . REVISON OF ARTERIOVENOUS FISTULA Right 3/41/9622   Procedure: PLICATION / REVISION OF ARTERIOVENOUS FISTULA;  Surgeon: Angelia Mould, MD;  Location: [place];  Service: Vascular;  Laterality: Right;  . REVISON OF ARTERIOVENOUS FISTULA Left 09/01/2015   Procedure: SUPERFICIALIZATION OF LEFT ARM BRACHIOCEPHALIC ARTERIOVENOUS FISTULA;  Surgeon: Angelia Mould, MD;  Location: [place];  Service: Vascular;  Laterality: Left;  . SHOULDER ARTHROSCOPY Left ~ 2006   frozen  . TOE AMPUTATION Bilateral after 2005  . UMBILICAL HERNIA REPAIR  1990's  . VENOGRAM Right 07/28/2011   Procedure: VENOGRAM;  Surgeon: Conrad , MD;  Location: [facility] CATH LAB;  Service: Cardiovascular;  Laterality: Right;  . VITRECTOMY Bilateral       Family History  Problem Relation Age of Onset  . Colon polyps Father    . Diabetes Father   . Hypertension Father   . Hypertension Mother   . Diabetes Mother   . Hyperlipidemia Mother   . Breast cancer Maternal Aunt   . Cancer Maternal Aunt     Breast and Bone  . Malignant hyperthermia Neg Hx   . Heart attack Neg Hx   . Stroke Neg Hx       reports that he has never smoked. He has never used smokeless tobacco. He reports that he drinks alcohol. He reports that he does not use drugs.   Allergies  Allergen Reactions  . Contrast Media [Iodinated Diagnostic Agents] Rash and Itching    Systemic itching and transient rash appearance within hours of receiving contrast  Systemic itching and transient rash appearance within hours of receiving contrast  Redman Syndrome  . Rocephin [Ceftriaxone] Itching  . Amoxicillin Itching and Rash    .Has patient had a PCN reaction causing immediate rash, facial/tongue/throat swelling, SOB or lightheadedness with hypotension: Yes Has patient had a PCN reaction causing severe rash involving mucus membranes or skin necrosis: No Has patient had a PCN reaction that required hospitalization No Has patient had a PCN reaction occurring within the last 10 years: No If all of the above answers are "NO", then may proceed with Cephalosporin use.  . Buprenorphine Hcl Nausea Only and Hives  . Clopidogrel Rash  . Hydrocodone Rash  . Morphine Nausea Only, Rash and Hives  . Adhesive [Tape] Other (See Comments) and Itching    Blisters on arm from adhesive tape at dialysis Blisters on arm from adhesive tape at dialysis  . Prednisone Other (See Comments)    Increases sugar levels  . Ciprofloxacin Itching and Rash  . Clindamycin/Lincomycin Hives and Itching  . Codeine Hives, Itching and Rash  . Doxycycline Rash  . Enalapril Rash    cough  . Levofloxacin Hives and Itching  . Mobic [Meloxicam] Hives and Rash  . Oxycodone Rash  . Vasotec Cough    Prior to Admission medications   Medication Sig Start Date End Date Taking? Authorizing  Provider  acetaminophen (TYLENOL) 500 MG tablet Take 1,000 mg by mouth every 6 (six) hours as needed for moderate pain or headache.   Yes Historical Provider, MD  allopurinol (ZYLOPRIM) 100 MG tablet Take 200 mg by mouth daily.  03/14/12  Yes Historical Provider, MD  aspirin EC 81 MG  tablet Take 81 mg by mouth at bedtime.   Yes Historical Provider, MD  atorvastatin (LIPITOR) 80 MG tablet Take 1 tablet (80 mg total) by mouth daily at 6 PM. 01/04/16  Yes Florencia Reasons, MD  benzonatate (TESSALON) 100 MG capsule Take 1 capsule (100 mg total) by mouth every 8 (eight) hours. 01/07/16  Yes Carlisle Cater, PA-C  calcium acetate (PHOSLO) 667 MG capsule Take 1,334-2,001 mg by mouth See admin instructions. Take 3 capsules (2001 mg) by mouth with meals and 2 capsule (133m ) with snacks 09/19/12  Yes Historical Provider, MD  diphenhydrAMINE (BENADRYL) 25 mg capsule Take 1-2 capsules (25-50 mg total) by mouth every 6 (six) hours as needed for itching. Patient taking differently: Take 50 mg by mouth 2 (two) times daily as needed for itching or sleep.  04/11/14  Yes Rhonda G Barrett, PA-C  fenofibrate (TRICOR) 48 MG tablet Take 1 tablet (48 mg total) by mouth daily. 02/27/15  Yes Shanker MKristeen Mans MD  Ferric Citrate (AURYXIA) 1 GM 210 MG(Fe) TABS Take 2 g by mouth 3 (three) times daily with meals.   Yes Historical Provider, MD  gabapentin (NEURONTIN) 300 MG capsule Take 300 mg by mouth 3 (three) times daily.   Yes Historical Provider, MD  GLUCAGON EMERGENCY 1 MG injection Inject 1 mg into the muscle daily as needed (low blood sugar).  01/19/12  Yes Historical Provider, MD  HYDROmorphone (DILAUDID) 2 MG tablet Take 1 tablet (2 mg total) by mouth 3 (three) times daily as needed for severe pain. Patient taking differently: Take 2 mg by mouth 2 (two) times daily. Take 1 tablet (2 mg) by mouth daily at bedtime, may take an additional dose during the day as needed for severe pain 11/20/14  Yes KAlvia Grove PA-C  ibuprofen  (ADVIL,MOTRIN) 200 MG tablet Take 400 mg by mouth 2 (two) times daily as needed for moderate pain.   Yes Historical Provider, MD  Insulin Human (INSULIN PUMP) 100 unit/ml SOLN Inject 1 each into the skin continuous. Humalog   Yes Historical Provider, MD  insulin lispro (HUMALOG) 100 UNIT/ML injection Inject 175 Units into the skin continuous. Up to 175 units continuous in pump,  Basal rates area as follows:  2400 1.5 units/hr, 0300 1.9 units/hr, 0800 1.45 units/hr, 1200 1.625 units/hr   Yes Historical Provider, MD  levothyroxine (SYNTHROID, LEVOTHROID) 100 MCG tablet Take 100 mcg by mouth at bedtime.    Yes Historical Provider, MD  loratadine (CLARITIN) 10 MG tablet Take 10 mg by mouth daily.   Yes Historical Provider, MD  loratadine-pseudoephedrine (CLARITIN-D 12-HOUR) 5-120 MG tablet Take 1 tablet by mouth 2 (two) times daily.   Yes Historical Provider, MD  metoCLOPramide (REGLAN) 5 MG tablet Take 5 mg by mouth 4 (four) times daily - after meals and at bedtime. 05/26/11  Yes Historical Provider, MD  metoprolol tartrate (LOPRESSOR) 25 MG tablet Take 0.5 tablets (12.5 mg total) by mouth 2 (two) times daily. 01/04/16  Yes FFlorencia Reasons MD  midodrine (PROAMATINE) 10 MG tablet Take 20 mg by mouth every Monday, Wednesday, and Friday. On dialysis days   Yes Historical Provider, MD  multivitamin (RENA-VIT) TABS tablet Take 1 tablet by mouth daily.   Yes Historical Provider, MD  nitroGLYCERIN (NITROSTAT) 0.4 MG SL tablet Place 0.4 mg under the tongue every 5 (five) minutes as needed for chest pain.    Yes PThurnell Lose MD  Nutritional Supplements (FEEDING SUPPLEMENT, NEPRO CARB STEADY,) LIQD Take 237 mLs by mouth  daily. 01/04/16  Yes Florencia Reasons, MD  omeprazole (PRILOSEC) 20 MG capsule Take 20 mg by mouth daily.   Yes Historical Provider, MD  promethazine (PHENERGAN) 25 MG tablet Take 1 tablet (25 mg total) by mouth every 6 (six) hours as needed for nausea or vomiting. For nausea 11/27/14  Yes Liliane Shi, PA-C   rOPINIRole (REQUIP) 2 MG tablet Take 2 mg by mouth daily with supper.    Yes Historical Provider, MD  ticagrelor (BRILINTA) 90 MG TABS tablet Take 1 tablet (90 mg total) by mouth 2 (two) times daily. Please contact cardiology Dr  Camillia Herter office for prior authorization needs or refills. 01/04/16  Yes Florencia Reasons, MD     Anti-infectives    None      Results for orders placed or performed during the hospital encounter of 01/19/16 (from the past 48 hour(s))  Lipase, blood     Status: None   Collection Time: 01/19/16  2:39 PM  Result Value Ref Range   Lipase 25 11 - 51 U/L  Comprehensive metabolic panel     Status: Abnormal   Collection Time: 01/19/16  2:39 PM  Result Value Ref Range   Sodium 132 (L) 135 - 145 mmol/L   Potassium 4.2 3.5 - 5.1 mmol/L   Chloride 93 (L) 101 - 111 mmol/L   CO2 28 22 - 32 mmol/L   Glucose, Bld 89 65 - 99 mg/dL   BUN 16 6 - 20 mg/dL   Creatinine, Ser 7.89 (H) 0.61 - 1.24 mg/dL   Calcium 8.5 (L) 8.9 - 10.3 mg/dL   Total Protein 6.6 6.5 - 8.1 g/dL   Albumin 2.8 (L) 3.5 - 5.0 g/dL   AST 21 15 - 41 U/L   ALT 14 (L) 17 - 63 U/L   Alkaline Phosphatase 117 38 - 126 U/L   Total Bilirubin 0.5 0.3 - 1.2 mg/dL   GFR calc non Af Amer 7 (L) >60 mL/min   GFR calc Af Amer 8 (L) >60 mL/min    Comment: (NOTE) The eGFR has been calculated using the CKD EPI equation. This calculation has not been validated in all clinical situations. eGFR's persistently <60 mL/min signify possible Chronic Kidney Disease.    Anion gap 11 5 - 15  CBC     Status: Abnormal   Collection Time: 01/19/16  2:39 PM  Result Value Ref Range   WBC 5.4 4.0 - 10.5 K/uL   RBC 3.13 (L) 4.22 - 5.81 MIL/uL   Hemoglobin 9.1 (L) 13.0 - 17.0 g/dL   HCT 29.3 (L) 39.0 - 52.0 %   MCV 93.6 78.0 - 100.0 fL   MCH 29.1 26.0 - 34.0 pg   MCHC 31.1 30.0 - 36.0 g/dL   RDW 16.6 (H) 11.5 - 15.5 %   Platelets 319 150 - 400 K/uL  I-Stat CG4 Lactic Acid, ED     Status: None   Collection Time: 01/19/16  2:54 PM   Result Value Ref Range   Lactic Acid, Venous 1.06 0.5 - 1.9 mmol/L  I-stat troponin, ED     Status: None   Collection Time: 01/19/16  8:04 PM  Result Value Ref Range   Troponin i, poc 0.01 0.00 - 0.08 ng/mL   Comment 3            Comment: Due to the release kinetics of cTnI, a negative result within the first hours of the onset of symptoms does not rule out myocardial infarction with certainty. If myocardial  infarction is still suspected, repeat the test at appropriate intervals.   I-Stat CG4 Lactic Acid, ED     Status: None   Collection Time: 01/19/16  8:20 PM  Result Value Ref Range   Lactic Acid, Venous 1.43 0.5 - 1.9 mmol/L  Glucose, capillary     Status: Abnormal   Collection Time: 01/19/16 11:59 PM  Result Value Ref Range   Glucose-Capillary 154 (H) 65 - 99 mg/dL  Lactic acid, plasma     Status: None   Collection Time: 01/20/16 12:13 AM  Result Value Ref Range   Lactic Acid, Venous 1.1 0.5 - 1.9 mmol/L  Glucose, capillary     Status: Abnormal   Collection Time: 01/20/16  7:39 AM  Result Value Ref Range   Glucose-Capillary 114 (H) 65 - 99 mg/dL  Troponin I     Status: Abnormal   Collection Time: 01/20/16  7:45 AM  Result Value Ref Range   Troponin I 0.04 (HH) <0.03 ng/mL    Comment: CRITICAL RESULT CALLED TO, READ BACK BY AND VERIFIED WITH: NARAMBAS,J RN @ [phone] 01/20/16 LEONARD,A   CBC     Status: Abnormal   Collection Time: 01/20/16  7:45 AM  Result Value Ref Range   WBC 5.1 4.0 - 10.5 K/uL   RBC 2.86 (L) 4.22 - 5.81 MIL/uL   Hemoglobin 8.3 (L) 13.0 - 17.0 g/dL   HCT 26.8 (L) 39.0 - 52.0 %   MCV 93.7 78.0 - 100.0 fL   MCH 29.0 26.0 - 34.0 pg   MCHC 31.0 30.0 - 36.0 g/dL   RDW 16.5 (H) 11.5 - 15.5 %   Platelets 325 150 - 400 K/uL  Comprehensive metabolic panel     Status: Abnormal   Collection Time: 01/20/16  7:45 AM  Result Value Ref Range   Sodium 133 (L) 135 - 145 mmol/L   Potassium 5.2 (H) 3.5 - 5.1 mmol/L   Chloride 94 (L) 101 - 111 mmol/L   CO2 26 22  - 32 mmol/L   Glucose, Bld 108 (H) 65 - 99 mg/dL   BUN 22 (H) 6 - 20 mg/dL   Creatinine, Ser 9.64 (H) 0.61 - 1.24 mg/dL   Calcium 8.4 (L) 8.9 - 10.3 mg/dL   Total Protein 6.5 6.5 - 8.1 g/dL   Albumin 2.5 (L) 3.5 - 5.0 g/dL   AST 24 15 - 41 U/L   ALT 12 (L) 17 - 63 U/L   Alkaline Phosphatase 108 38 - 126 U/L   Total Bilirubin 0.6 0.3 - 1.2 mg/dL   GFR calc non Af Amer 6 (L) >60 mL/min   GFR calc Af Amer 6 (L) >60 mL/min    Comment: (NOTE) The eGFR has been calculated using the CKD EPI equation. This calculation has not been validated in all clinical situations. eGFR's persistently <60 mL/min signify possible Chronic Kidney Disease.    Anion gap 13 5 - 15  Glucose, capillary     Status: None   Collection Time: 01/20/16 11:52 AM  Result Value Ref Range   Glucose-Capillary 75 65 - 99 mg/dL     ROS: see hpi   Physical Exam: Vitals:   01/20/16 0538 01/20/16 1020  BP: (!) 167/71 (!) 153/60  Pulse: 96 96  Resp: 14 16  Temp: 98.5 F (36.9 C) 98.4 F (36.9 C)     General: adult WM Obese. nad OX3/ appropriate2 HEENT: [place], MMM, EOMI, Nonicteric Neck: no jvd  Heart: RRR  1/6 sem lsb  ,no  rub  Lungs: CTA ,nonlabored breathing Abdomen: Obese, BS Pos sl hyperactive, Nontender. nondistended Extremities: Trace R pedal edema/ L BKA Skin:  Lower Right extrem  Venous statis skin changes Neuro: alert/ OX3 / no acute focal deficits for him  Dialysis Access: pos bruit L UA AVF  Dialysis Orders: [facility]: [facility] (to [facility] to [facility] next week)  on MWF .  EDW 126kg HD Bath 2k, 2.25ca  Time 4hr  Heparin none. Access LUAVF BFR 300 DFR 800   Hec3 mcg IV/HD  Mircera 230mg q 2weeks last given 01/06/16       Venofer  528mq weekly  Other op labs last  On 9/13 hgb 7.4  Ca 9.5 12/16/15  pth 270  12/16/15   Assessment/Plan  1. Intractable nausea and vomiting: ? etiology =? Gastroparesis/ ? constpation  /Sepsis/ Viral / =Admit team RX  2. ESRD - HD  MWF  k 5.2  Hd today   3. Hypertension/volume  - At edw a per bed wt /states he can stand with prosthetic leg tp weigh  , / 153/60 bp attempt 1 l uf with pedal edema no gross vol on cxr  But believes he is losing wt. 4. Anemia  - hgb 8.3  Using max ESA  For dosing today/ weekly fe , no recent iron  5. Metabolic bone disease -  Velphoro for binder (no Auryxia avail In hosp)/ Hec on hd  6. IDDM  Type 1 per admit 7. CAD recent STEMI / intubation  8. Hoarseness sp Intubation (had ENT eval [facility]= 'vocal chords ok')   DaErnest HaberPA-C [id number]/27/2017, 12:13 PM

## 2016-01-20 NOTE — Progress Notes (Signed)
*  PRELIMINARY RESULTS* Vascular Ultrasound Lower Extremity Arterial Duplex has been completed.  Preliminary findings: No evidence of pseudoaneurysm in the left groin. Hematoma noted measuring 6.62x 2.28x 2.43 cm.   Landry Mellow, RDMS, RVT  01/20/2016, 9:36 AM

## 2016-01-20 NOTE — Telephone Encounter (Signed)
New Message  Pt c/o medication issue:  1. Name of Medication: Brilinta  2. How are you currently taking this medication (dosage and times per day)? 90 mg tablets twice daily  3. Are you having a reaction (difficulty breathing--STAT)? No  4. What is your medication issue? Pts wife voiced he's been placed on this medication for his stint and since he's been on it he's been nauses and wanting to know if he can be placed back on his previous medications.  Pts wife voiced he has also been admitted to ED also.  Please f/u with pts wife

## 2016-01-20 NOTE — Procedures (Signed)
Patient seen on Hemodialysis. QB 400, UF goal 1L .  Has RVR, metop given, pressures holding in 110s.   Treatment adjusted as needed.  Madelon Lips MD [facility]. Cell [phone] Pager # [phone] 3:25 PM

## 2016-01-20 NOTE — Progress Notes (Signed)
Dialysis treatment completed.  1900 mL ultrafiltrated and net fluid removal 1400 mL.    Patient status unchanged. Lung sounds diminished to ausculation in all fields. Generalized edema. Cardiac: NSR.  Disconnected lines and removed needles.  Pressure held for 10 minutes and band aid/gauze dressing applied.  Report given to bedside RN, Ulice Dash.

## 2016-01-20 NOTE — Telephone Encounter (Signed)
Spoke with pt's wife. She reports pt is currently in ED with nausea. She reports she thinks they are going to send him home but she is concerned because he is still nauseated and not able to eat.  I asked her to address these concerns with ED staff.  Pt's wife states pt has been nauseated since stent placed and she thinks this is related to [place].  She is concerned cardiology has not seen pt in ED to discuss Brilinta.  I told her she could request cardiology consult while pt is at hospital.  We became disconnected during call and I placed return call to pt's wife and left message to call back if additional questions.

## 2016-01-20 NOTE — Progress Notes (Signed)
Patient arrived to unit per bed.  Reviewed treatment plan and this RN agrees.  Report received from bedside RN, Ulice Dash.  Consent obtained.  Patient A & O X 4. Lung sounds clear to ausculation in all fields. Generalized LE edema. Cardiac: NSR.  Prepped LUAVF with alcohol and cannulated with two 15 gauge needles.  Pulsation of blood noted.  Flushed access well with saline per protocol.  Connected and secured lines and initiated tx at 1403.  UF goal of 1900 mL and net fluid removal of 1400 mL.  Will continue to monitor.

## 2016-01-20 NOTE — Progress Notes (Signed)
PROGRESS NOTE    Dale [name]  JJK:[id number] DOB: Apr 13, 1966 DOA: 01/19/2016 PCP: [name], MD    Brief Narrative:  50 y.o. male with a past medical history significant for ESRD on HD MWF after failed transplant, HFpEF, IDDM on pump, BKA, and CAD s/p PCI who presents with nausea and vomiting.   Assessment & Plan:   Principal Problem:   Nausea & vomiting - Most likely due to diabetic gastroparesis. Will increase Reglan dose and make IV for now. With improvement in condition will change back to home regimen.   Active Problems:   History of renal transplant - Nephrology on board, continue home medication regimen    S/P BKA (below knee amputation) (HCC)   Obstructive sleep apnea\ - stable   Chronic diastolic CHF (congestive heart failure) ([place]) - appears compensated currently    Anemia - Most likely due to chronic illness from ESRD - no active bleeding.    Diabetes mellitus with nephropathy ([place])   ESRD (end stage renal disease) ([place]) - Consulting Nephrology    Coronary artery disease involving native coronary artery of native heart with unstable angina pectoris ([place])   Hyponatremia  Hyperkalemia - Will be addressed during dialysis   DVT prophylaxis: Heparin Code Status: Full Family Communication: d/c patient Disposition Plan: Pending improvement in condition   Consultants:   Nephrology   Procedures: None   Antimicrobials: None   Subjective: Pt has no new complaints. No acute issues overnight.  Objective: Vitals:   01/20/16 0538 01/20/16 1020 01/20/16 1354 01/20/16 1403  BP: (!) 167/71 (!) 153/60 (!) 169/80 (!) 177/82  Pulse: 96 96 (!) 104 97  Resp: 14 16 14    Temp: 98.5 F (36.9 C) 98.4 F (36.9 C) 98.1 F (36.7 C)   TempSrc: Oral Oral    SpO2: 97% 100%    Weight:   127.4 kg (280 lb 13.9 oz)   Height:        Intake/Output Summary (Last 24 hours) at 01/20/16 1415 Last data filed at 01/20/16 0930  Gross per 24 hour  Intake               480 ml  Output                0 ml  Net              480 ml   Filed Weights   01/19/16 2353 01/20/16 1354  Weight: 126.1 kg (278 lb) 127.4 kg (280 lb 13.9 oz)    Examination:  General exam: Appears calm and comfortable, in NAD.  Head: atraumatic, normocephalic Respiratory system: Clear to auscultation. Respiratory effort normal, equal chest rise. Cardiovascular system: S1 & S2 heard, RRR. No JVD, murmurs, rubs, gallops or clicks. No pedal edema. Gastrointestinal system: Abdomen is nondistended, soft and nontender. No organomegaly or masses felt. Normal bowel sounds heard. Central nervous system: Alert and oriented. No focal neurological deficits. Extremities: equal tone, no rashes on limited exam. Skin: No rashes, lesions or ulcers Psychiatry: Judgement and insight appear normal. Mood & affect appropriate.   Data Reviewed: I have personally reviewed following labs and imaging studies  CBC:  Recent Labs Lab 01/19/16 1439 01/20/16 0745  WBC 5.4 5.1  HGB 9.1* 8.3*  HCT 29.3* 26.8*  MCV 93.6 93.7  PLT 319 371   Basic Metabolic Panel:  Recent Labs Lab 01/19/16 1439 01/20/16 0745  NA 132* 133*  K 4.2 5.2*  CL 93* 94*  CO2 28 26  GLUCOSE  89 108*  BUN 16 22*  CREATININE 7.89* 9.64*  CALCIUM 8.5* 8.4*   GFR: Estimated Creatinine Clearance: 11.9 mL/min (by C-G formula based on SCr of 9.64 mg/dL (H)). Liver Function Tests:  Recent Labs Lab 01/19/16 1439 01/20/16 0745  AST 21 24  ALT 14* 12*  ALKPHOS 117 108  BILITOT 0.5 0.6  PROT 6.6 6.5  ALBUMIN 2.8* 2.5*    Recent Labs Lab 01/19/16 1439  LIPASE 25   No results for input(s): AMMONIA in the last 168 hours. Coagulation Profile: No results for input(s): INR, PROTIME in the last 168 hours. Cardiac Enzymes:  Recent Labs Lab 01/20/16 0745  TROPONINI 0.04*   BNP (last 3 results) No results for input(s): PROBNP in the last 8760 hours. HbA1C: No results for input(s): HGBA1C in the last 72  hours. CBG:  Recent Labs Lab 01/19/16 2359 01/20/16 0739 01/20/16 1152  GLUCAP 154* 114* 75   Lipid Profile: No results for input(s): CHOL, HDL, LDLCALC, TRIG, CHOLHDL, LDLDIRECT in the last 72 hours. Thyroid Function Tests: No results for input(s): TSH, T4TOTAL, FREET4, T3FREE, THYROIDAB in the last 72 hours. Anemia Panel: No results for input(s): VITAMINB12, FOLATE, FERRITIN, TIBC, IRON, RETICCTPCT in the last 72 hours. Sepsis Labs:  Recent Labs Lab 01/19/16 1454 01/19/16 2020 01/20/16 0013  LATICACIDVEN 1.06 1.43 1.1    No results found for this or any previous visit (from the past 240 hour(s)).       Radiology Studies: Ct Abdomen Pelvis Wo Contrast  Result Date: 01/19/2016 CLINICAL DATA:  50 year old male with abdominal pain, nausea vomiting. Low grade fever. EXAM: CT ABDOMEN AND PELVIS WITHOUT CONTRAST TECHNIQUE: Multidetector CT imaging of the abdomen and pelvis was performed following the standard protocol without IV contrast. COMPARISON:  Abdominal CT dated 12/1915 FINDINGS: Evaluation of this exam is limited in the absence of intravenous contrast. The visualized lung bases are clear. A There is coronary vascular calcification. There is hypoattenuation of the cardiac blood pool suggestive of a degree of anemia. Clinical correlation is recommended. No intra-abdominal free air or free fluid. Focal area of hypodensity in the periphery of the left lobe of the liver along the falciform ligament appears similar to prior study and most likely represents a focal fatty infiltration. No intrahepatic biliary ductal dilatation. The gallbladder is mildly distended. No definite calcified gallstone identified. No pericholecystic fluid. The pancreas is somewhat atrophic for the patient's age. No pancreatic ductal dilatation or peripancreatic stranding. The spleen and the adrenal glands appear unremarkable. The native kidneys are atrophic. Small bilateral renal hypodense lesions are not  characterized But likely represent cysts. The visualized ureters appear unremarkable. The urinary bladder is collapsed. A left hemipelvic transplant kidney is noted which appears similar in morphology to the prior study. There is no hydronephrosis or nephrolithiasis of the transplant kidney. No peritransplant fluid collection identified. There is obliterated appearance of the renal sinus fat similar to the prior study but new compared to the CT dated 2014 concerning for failed transplant for chronic rejection. Clinical correlation is recommended. The prostate and seminal vesicles are grossly unremarkable. Small scattered colonic diverticula without active inflammatory changes. Constipation. There is no evidence of bowel obstruction or active inflammation. Appendectomy. There is moderate aortoiliac atherosclerotic disease. The abdominal aorta and IVC are grossly unremarkable on this noncontrast study. No portal venous gas identified. There is atherosclerotic calcification of the mesentery vasculature. There is bilateral iliac artery atherosclerotic calcification. There is no retroperitoneal or mesenteric adenopathy. A 2.2 x 5.6 cm ovoid  structure in the left inguinal region appears slightly larger compared to prior study. This may represent a small focal hematoma related to catheterization. Please see the aneurysm is not excluded. Duplex ultrasound may provide better evaluation. There is mild diffuse stranding and edema of the subcutaneous fat and an area of induration of the skin in the anterior pelvis. No drainable fluid collection/abscess. Right inguinal surgical clips noted. There is a small infraumbilical hernia containing a short segment of small bowel loop without evidence of obstruction or inflammation. There is degenerative changes of the spine. No acute fracture. L1 inferior endplate Schmorl's node. IMPRESSION: No acute intra-abdominal pelvic pathology. Small left inguinal hematoma slightly increased in  size compared to prior study. A pseudo aneurysm is not excluded. Duplex ultrasound may provide better evaluation. Left lower quadrant transplant kidney similar to the prior CT. There is obliteration of the renal sinus fat concerning for failed transplant. Constipation.  No bowel obstruction or active inflammation. Small scattered colonic diverticula. Diffuse subcutaneous stranding and edema. Electronically Signed   By: Anner Crete M.D.   On: 01/19/2016 21:45   Dg Chest 2 View  Result Date: 01/19/2016 CLINICAL DATA:  Cough since 01/04/2016 EXAM: CHEST  2 VIEW COMPARISON:  01/12/2016 FINDINGS: There is mild bilateral interstitial thickening. There is no focal parenchymal opacity. There is no pleural effusion or pneumothorax. There is stable cardiomegaly. The osseous structures are unremarkable. IMPRESSION: Cardiomegaly with mild pulmonary vascular congestion. Electronically Signed   By: Kathreen Devoid   On: 01/19/2016 21:21        Scheduled Meds: . allopurinol  200 mg Oral Daily  . aspirin EC  81 mg Oral QHS  . atorvastatin  80 mg Oral q1800  . calcium acetate  2,001 mg Oral TID WC  . darbepoetin (ARANESP) injection - DIALYSIS  200 mcg Intravenous Q Wed-HD  . [START ON 01/22/2016] doxercalciferol  3 mcg Intravenous Q M,W,F-HD  . fenofibrate  54 mg Oral Daily  . ferric gluconate (FERRLECIT/NULECIT) IV  125 mg Intravenous Once  . gabapentin  300 mg Oral TID  . heparin  5,000 Units Subcutaneous Q8H  . [START ON 01/21/2016] Influenza vac split quadrivalent PF  0.5 mL Intramuscular Tomorrow-1000  . insulin aspart  0-5 Units Subcutaneous QHS  . insulin aspart  0-9 Units Subcutaneous TID WC  . insulin glargine  10 Units Subcutaneous Daily  . levothyroxine  100 mcg Oral QHS  . loratadine  5 mg Oral BID   And  . pseudoephedrine  120 mg Oral BID  . metoCLOPramide  5 mg Oral TID PC & HS  . metoprolol tartrate  12.5 mg Oral BID  . midodrine  20 mg Oral Q M,W,F  . pantoprazole  40 mg Oral Daily    . rOPINIRole  2 mg Oral Q supper  . sucroferric oxyhydroxide  1,000 mg Oral TID WC  . ticagrelor  90 mg Oral BID   Continuous Infusions:    LOS: 0 days   Time spent: > 35 minutes  Velvet Bathe, MD [facility] Pager [phone]  If 7PM-7AM, please contact night-coverage [email or web address] Password [email or web address] 01/20/2016, 2:15 PM

## 2016-01-20 NOTE — Progress Notes (Signed)
CRITICAL VALUE ALERT  Critical value received:  Troponin 0.04 Date of notification:  01/20/16  Time of notification: 08:38 am  Critical value read back:Yes.    Nurse who received alert:  Alden Server  MD notified (1st page):  Velvet Bathe Time of first page: 08:40 am  MD notified (2nd page):  Time of second page:  Responding MD: Dr. Wendee Beavers   Time MD responded:  08:41 am

## 2016-01-21 DIAGNOSIS — E1143 Type 2 diabetes mellitus with diabetic autonomic (poly)neuropathy: Secondary | ICD-10-CM

## 2016-01-21 DIAGNOSIS — K3184 Gastroparesis: Secondary | ICD-10-CM

## 2016-01-21 DIAGNOSIS — R1110 Vomiting, unspecified: Secondary | ICD-10-CM | POA: Diagnosis not present

## 2016-01-21 LAB — GLUCOSE, CAPILLARY
Glucose-Capillary: 153 mg/dL — ABNORMAL HIGH (ref 65–99)
Glucose-Capillary: 213 mg/dL — ABNORMAL HIGH (ref 65–99)
Glucose-Capillary: 240 mg/dL — ABNORMAL HIGH (ref 65–99)

## 2016-01-21 MED ORDER — METOCLOPRAMIDE HCL 5 MG PO TABS: 10.0000 mg | ORAL_TABLET | Freq: Three times a day (TID) | ORAL | 0 refills | Status: DC

## 2016-01-21 MED ORDER — LORATADINE 5 MG/5ML PO SYRP
5.0000 mg | ORAL_SOLUTION | Freq: Every day | ORAL | Status: DC | PRN
  Filled 2016-01-21: qty 5

## 2016-01-21 MED ORDER — TRAMADOL HCL 50 MG PO TABS
50.0000 mg | ORAL_TABLET | Freq: Once | ORAL | Status: AC
  Administered 2016-01-21: 50 mg via ORAL
  Filled 2016-01-21: qty 1

## 2016-01-21 NOTE — Progress Notes (Signed)
Subjective:  Tolerated HD yest. Dale [name] solid breakfast tolerating   Objective Vital signs in last 24 hours: Vitals:   01/20/16 1840 01/20/16 2048 01/21/16 0553 01/21/16 0921  BP: 129/60 (!) 142/57 140/63 (!) 153/80  Pulse: (!) 101 88 (!) 111 (!) 102  Resp: 15 16 18 18   Temp: 98.9 F (37.2 C) (!) 100.4 F (38 C) 98.7 F (37.1 C) 99.8 F (37.7 C)  TempSrc: Oral Oral Oral Oral  SpO2: 100% 96% 97% 98%  Weight:  126.3 kg (278 lb 7.1 oz)    Height:       Weight change: 1.3 kg (2 lb 13.9 oz)  Physical Exam: General: adult WM Obese. NAD  Coughing less  OX3 Heart: RRR  1/6 sem lsb  ,no rub  Lungs: L  Upper faint  Exp wheeze / nonlabored breathing Abdomen: Obese, BS Pos sl hyperactive, Nontender. nondistended Extremities: Trace R pedal edema/ L BKA with  Lower Right extrem  Venous statis skin changes Dialysis Access: pos bruit L UA AVF   Dialysis Orders: Center: [facility] (to transfer to [facility] next week)  on MWF .  EDW 126kg HD Bath 2k, 2.25ca  Time 4hr  Heparin none. Access LUAVF BFR 300 DFR 800   Hec3 mcg IV/HD  Mircera 270mcg q 2weeks last given 01/06/16       Venofer  50mg  q weekly  Other op labs last  On 9/13 hgb 7.4  Ca 9.5 12/16/15  pth 270  12/16/15    Problem/Plan: 1. Intractable nausea and vomiting: =? Gastroparesis/ improved with IV reglan /=Admit team RX  2. ESRD - HD  MWF  k 5.2  Hd today  3. Hypertension/volume  -153/80 bp  / hd yest 1400 cc uf and At edw with stand wt [name] he can stand with prosthetic leg tp weigh / HD tomorrow 2 l uf believes he is losing wt. 4. Anemia  - hgb 8.3  Using max ESA  For dosing today/ weekly fe , no recent iron  5. Metabolic bone disease -  Velphoro for binder (no Auryxia avail In hosp)/ Hec on hd  6. IDDM  Type 1 per admit 7. CAD recent STEMI / intubation  8.  Cough / Hoarseness sp Intubation (had ENT eval [facility]= 'vocal chords ok') - improved cough this am  Prn nebulizer  Uses inhaler as op prn     Ernest Haber,  PA-C [facility]per [phone] 01/21/2016,9:54 AM  LOS: 0 days   Labs: Basic Metabolic Panel:  Recent Labs Lab 01/19/16 1439 01/20/16 0745  NA 132* 133*  K 4.2 5.2*  CL 93* 94*  CO2 28 26  GLUCOSE 89 108*  BUN 16 22*  CREATININE 7.89* 9.64*  CALCIUM 8.5* 8.4*   Liver Function Tests:  Recent Labs Lab 01/19/16 1439 01/20/16 0745  AST 21 24  ALT 14* 12*  ALKPHOS 117 108  BILITOT 0.5 0.6  PROT 6.6 6.5  ALBUMIN 2.8* 2.5*    Recent Labs Lab 01/19/16 1439  LIPASE 25   No results for input(s): AMMONIA in the last 168 hours. CBC:  Recent Labs Lab 01/19/16 1439 01/20/16 0745  WBC 5.4 5.1  HGB 9.1* 8.3*  HCT 29.3* 26.8*  MCV 93.6 93.7  PLT 319 325   Cardiac Enzymes:  Recent Labs Lab 01/20/16 0745  TROPONINI 0.04*   CBG:  Recent Labs Lab 01/20/16 1420 01/20/16 1847 01/20/16 2031 01/21/16 0010 01/21/16 0758  GLUCAP 102* 114* 163* 240*  153*    Studies/Results: Ct Abdomen Pelvis Wo Contrast  Result Date: 01/19/2016 CLINICAL DATA:  50 year old male with abdominal pain, nausea vomiting. Low grade fever. EXAM: CT ABDOMEN AND PELVIS WITHOUT CONTRAST TECHNIQUE: Multidetector CT imaging of the abdomen and pelvis was performed following the standard protocol without IV contrast. COMPARISON:  Abdominal CT dated 12/1915 FINDINGS: Evaluation of this exam is limited in the absence of intravenous contrast. The visualized lung bases are clear. A There is coronary vascular calcification. There is hypoattenuation of the cardiac blood pool suggestive of a degree of anemia. Clinical correlation is recommended. No intra-abdominal free air or free fluid. Focal area of hypodensity in the periphery of the left lobe of the liver along the falciform ligament appears similar to prior study and most likely represents a focal fatty infiltration. No intrahepatic biliary ductal dilatation. The gallbladder is mildly distended. No definite calcified gallstone  identified. No pericholecystic fluid. The pancreas is somewhat atrophic for the patient's age. No pancreatic ductal dilatation or peripancreatic stranding. The spleen and the adrenal glands appear unremarkable. The native kidneys are atrophic. Small bilateral renal hypodense lesions are not characterized But likely represent cysts. The visualized ureters appear unremarkable. The urinary bladder is collapsed. A left hemipelvic transplant kidney is noted which appears similar in morphology to the prior study. There is no hydronephrosis or nephrolithiasis of the transplant kidney. No peritransplant fluid collection identified. There is obliterated appearance of the renal sinus fat similar to the prior study but new compared to the CT dated 2014 concerning for failed transplant for chronic rejection. Clinical correlation is recommended. The prostate and seminal vesicles are grossly unremarkable. Small scattered colonic diverticula without active inflammatory changes. Constipation. There is no evidence of bowel obstruction or active inflammation. Appendectomy. There is moderate aortoiliac atherosclerotic disease. The abdominal aorta and IVC are grossly unremarkable on this noncontrast study. No portal venous gas identified. There is atherosclerotic calcification of the mesentery vasculature. There is bilateral iliac artery atherosclerotic calcification. There is no retroperitoneal or mesenteric adenopathy. A 2.2 x 5.6 cm ovoid structure in the left inguinal region appears slightly larger compared to prior study. This may represent a small focal hematoma related to catheterization. Please see the aneurysm is not excluded. Duplex ultrasound may provide better evaluation. There is mild diffuse stranding and edema of the subcutaneous fat and an area of induration of the skin in the anterior pelvis. No drainable fluid collection/abscess. Right inguinal surgical clips noted. There is a small infraumbilical hernia containing a  short segment of small bowel loop without evidence of obstruction or inflammation. There is degenerative changes of the spine. No acute fracture. L1 inferior endplate Schmorl's node. IMPRESSION: No acute intra-abdominal pelvic pathology. Small left inguinal hematoma slightly increased in size compared to prior study. A pseudo aneurysm is not excluded. Duplex ultrasound may provide better evaluation. Left lower quadrant transplant kidney similar to the prior CT. There is obliteration of the renal sinus fat concerning for failed transplant. Constipation.  No bowel obstruction or active inflammation. Small scattered colonic diverticula. Diffuse subcutaneous stranding and edema. Electronically Signed   By: Anner Crete M.D.   On: 01/19/2016 21:45   Dg Chest 2 View  Result Date: 01/19/2016 CLINICAL DATA:  Cough since 01/04/2016 EXAM: CHEST  2 VIEW COMPARISON:  01/12/2016 FINDINGS: There is mild bilateral interstitial thickening. There is no focal parenchymal opacity. There is no pleural effusion or pneumothorax. There is stable cardiomegaly. The osseous structures are unremarkable. IMPRESSION: Cardiomegaly with mild pulmonary vascular congestion. Electronically  Signed   By: Kathreen Devoid   On: 01/19/2016 21:21   Medications:   . allopurinol  200 mg Oral Daily  . aspirin EC  81 mg Oral QHS  . atorvastatin  80 mg Oral q1800  . calcium acetate  2,001 mg Oral TID WC  . darbepoetin (ARANESP) injection - DIALYSIS  200 mcg Intravenous Q Wed-HD  . [START ON 01/22/2016] doxercalciferol  3 mcg Intravenous Q M,W,F-HD  . fenofibrate  54 mg Oral Daily  . gabapentin  300 mg Oral TID  . heparin  5,000 Units Subcutaneous Q8H  . Influenza vac split quadrivalent PF  0.5 mL Intramuscular Tomorrow-1000  . insulin aspart  0-5 Units Subcutaneous QHS  . insulin aspart  0-9 Units Subcutaneous TID WC  . insulin glargine  10 Units Subcutaneous Daily  . levothyroxine  100 mcg Oral QHS  . loratadine  5 mg Oral BID   And  .  pseudoephedrine  120 mg Oral BID  . metoCLOPramide (REGLAN) injection  10 mg Intravenous Q6H  . metoprolol tartrate  12.5 mg Oral BID  . midodrine  20 mg Oral Q M,W,F  . pantoprazole  40 mg Oral Daily  . rOPINIRole  2 mg Oral Q supper  . sucroferric oxyhydroxide  1,000 mg Oral TID WC  . ticagrelor  90 mg Oral BID

## 2016-01-21 NOTE — Progress Notes (Signed)
[facility]  Patient Status: Active (receiving services up to time of hospitalization)  [facility] is providing the following services: RN, PT and OT  If patient discharges after hours, please call [phone].   Dale [name] 01/21/2016, 11:23 AM

## 2016-01-21 NOTE — Discharge Summary (Signed)
Physician Discharge Summary  Dale [name] PYP:[id number] DOB: 08/28/1965 DOA: 01/19/2016  PCP: [name], MD  Admit date: 01/19/2016 Discharge date: 01/21/2016  Time spent: > 35 minutes  Recommendations for Outpatient Follow-up:  1. Monitor K level 2. Ensure patient f/u with nephrologist 3. Pt has diabetic gastroparesis be careful with opiods regimen as I suspect this may be exacerbating his gastroparesis symptoms   Discharge Diagnoses:  Principal Problem:   Nausea & vomiting Active Problems:   History of renal transplant   S/P BKA (below knee amputation) (HCC)   Obstructive sleep apnea   Chronic diastolic CHF (congestive heart failure) (HCC)   Anemia   Diabetes mellitus with nephropathy (HCC)   ESRD (end stage renal disease) ([place])   Coronary artery disease involving native coronary artery of native heart with unstable angina pectoris ([place])   Hyponatremia   Discharge Condition: stable  Diet recommendation: carb modified diet  Filed Weights   01/20/16 1354 01/20/16 1803 01/20/16 2048  Weight: 127.4 kg (280 lb 13.9 oz) 126 kg (277 lb 12.5 oz) 126.3 kg (278 lb 7.1 oz)    History of present illness:  50 y.o.malewith a past medical history significant for ESRD on HD MWF after failed transplant, HFpEF, IDDM on pump, BKA, and CAD s/p PCIwho presents with nausea and vomiting.  Hospital Course:  Principal Problem:   Nausea & vomiting - Most likely due to diabetic gastroparesis. Improved while patient on Reglan.   Fever - At this point no source of infection identified. Patient would like to go home. He plans on following up with his pcp.   Active Problems:   History of renal transplant - Nephrology on board, continue home medication regimen    S/P BKA (below knee amputation) (HCC)   Obstructive sleep apnea - stable   Chronic diastolic CHF (congestive heart failure) ([place]) - appears compensated currently    Anemia - Most likely due to chronic illness from  ESRD - no active bleeding.    Diabetes mellitus with nephropathy ([place])   ESRD (end stage renal disease) ([place]) - Consulting Nephrology    Coronary artery disease involving native coronary artery of native heart with unstable angina pectoris ([place]) - stable, no chest pain reported.    Hyponatremia  Hyperkalemia - pt is s/p dialysis  Procedures:  None  Consultations:  Nephrology  Discharge Exam: Vitals:   01/21/16 0553 01/21/16 0921  BP: 140/63 (!) 153/80  Pulse: (!) 111 (!) 102  Resp: 18 18  Temp: 98.7 F (37.1 C) 99.8 F (37.7 C)    General: Pt in nad, alert and awake Cardiovascular: rrr, no rubs Respiratory: no increased wob, no wheezes  Discharge Instructions   Discharge Instructions    Diet - low sodium heart healthy    Complete by:  As directed    Discharge instructions    Complete by:  As directed    Please be sure to follow up with your primary care physician and gastroenterologist after hospital discharge.   Increase activity slowly    Complete by:  As directed      Current Discharge Medication List    CONTINUE these medications which have CHANGED   Details  metoCLOPramide (REGLAN) 5 MG tablet Take 2 tablets (10 mg total) by mouth 4 (four) times daily - after meals and at bedtime. Qty: 60 tablet, Refills: 0      CONTINUE these medications which have NOT CHANGED   Details  acetaminophen (TYLENOL) 500 MG tablet Take 1,000 mg by  mouth every 6 (six) hours as needed for moderate pain or headache.    allopurinol (ZYLOPRIM) 100 MG tablet Take 200 mg by mouth daily.     aspirin EC 81 MG tablet Take 81 mg by mouth at bedtime.    atorvastatin (LIPITOR) 80 MG tablet Take 1 tablet (80 mg total) by mouth daily at 6 PM. Qty: 30 tablet, Refills: 0    benzonatate (TESSALON) 100 MG capsule Take 1 capsule (100 mg total) by mouth every 8 (eight) hours. Qty: 15 capsule, Refills: 0    calcium acetate (PHOSLO) 667 MG capsule Take 1,334-2,001 mg by mouth See  admin instructions. Take 3 capsules (2001 mg) by mouth with meals and 2 capsule (1334mg  ) with snacks    diphenhydrAMINE (BENADRYL) 25 mg capsule Take 1-2 capsules (25-50 mg total) by mouth every 6 (six) hours as needed for itching. Qty: 30 capsule, Refills: 0    fenofibrate (TRICOR) 48 MG tablet Take 1 tablet (48 mg total) by mouth daily. Qty: 30 tablet, Refills: 0    Ferric Citrate (AURYXIA) 1 GM 210 MG(Fe) TABS Take 2 g by mouth 3 (three) times daily with meals.    gabapentin (NEURONTIN) 300 MG capsule Take 300 mg by mouth 3 (three) times daily.    GLUCAGON EMERGENCY 1 MG injection Inject 1 mg into the muscle daily as needed (low blood sugar).     HYDROmorphone (DILAUDID) 2 MG tablet Take 1 tablet (2 mg total) by mouth 3 (three) times daily as needed for severe pain. Qty: 20 tablet, Refills: 0    Insulin Human (INSULIN PUMP) 100 unit/ml SOLN Inject 1 each into the skin continuous. Humalog    insulin lispro (HUMALOG) 100 UNIT/ML injection Inject 175 Units into the skin continuous. Up to 175 units continuous in pump,  Basal rates area as follows:  2400 1.5 units/hr, 0300 1.9 units/hr, 0800 1.45 units/hr, 1200 1.625 units/hr    levothyroxine (SYNTHROID, LEVOTHROID) 100 MCG tablet Take 100 mcg by mouth at bedtime.     loratadine-pseudoephedrine (CLARITIN-D 12-HOUR) 5-120 MG tablet Take 1 tablet by mouth 2 (two) times daily.    metoprolol tartrate (LOPRESSOR) 25 MG tablet Take 0.5 tablets (12.5 mg total) by mouth 2 (two) times daily. Qty: 60 tablet, Refills: 0    midodrine (PROAMATINE) 10 MG tablet Take 20 mg by mouth every Monday, Wednesday, and Friday. On dialysis days    multivitamin (RENA-VIT) TABS tablet Take 1 tablet by mouth daily.    nitroGLYCERIN (NITROSTAT) 0.4 MG SL tablet Place 0.4 mg under the tongue every 5 (five) minutes as needed for chest pain.     Nutritional Supplements (FEEDING SUPPLEMENT, NEPRO CARB STEADY,) LIQD Take 237 mLs by mouth daily. Qty: 30 Can, Refills:  0    omeprazole (PRILOSEC) 20 MG capsule Take 20 mg by mouth daily.    promethazine (PHENERGAN) 25 MG tablet Take 1 tablet (25 mg total) by mouth every 6 (six) hours as needed for nausea or vomiting. For nausea Qty: 30 tablet, Refills: 0    rOPINIRole (REQUIP) 2 MG tablet Take 2 mg by mouth daily with supper.     ticagrelor (BRILINTA) 90 MG TABS tablet Take 1 tablet (90 mg total) by mouth 2 (two) times daily. Please contact cardiology Dr  Camillia Herter office for prior authorization needs or refills. Qty: 60 tablet, Refills: 0      STOP taking these medications     ibuprofen (ADVIL,MOTRIN) 200 MG tablet      loratadine (CLARITIN) 10 MG  tablet        Allergies  Allergen Reactions  . Contrast Media [Iodinated Diagnostic Agents] Rash and Itching    Systemic itching and transient rash appearance within hours of receiving contrast  Systemic itching and transient rash appearance within hours of receiving contrast  Redman Syndrome  . Rocephin [Ceftriaxone] Itching  . Amoxicillin Itching and Rash    .Has patient had a PCN reaction causing immediate rash, facial/tongue/throat swelling, SOB or lightheadedness with hypotension: Yes Has patient had a PCN reaction causing severe rash involving mucus membranes or skin necrosis: No Has patient had a PCN reaction that required hospitalization No Has patient had a PCN reaction occurring within the last 10 years: No If all of the above answers are "NO", then may proceed with Cephalosporin use.  . Buprenorphine Hcl Nausea Only and Hives  . Clopidogrel Rash  . Hydrocodone Rash  . Morphine Nausea Only, Rash and Hives  . Adhesive [Tape] Other (See Comments) and Itching    Blisters on arm from adhesive tape at dialysis Blisters on arm from adhesive tape at dialysis  . Prednisone Other (See Comments)    Increases sugar levels  . Ciprofloxacin Itching and Rash  . Clindamycin/Lincomycin Hives and Itching  . Codeine Hives, Itching and Rash  .  Doxycycline Rash  . Enalapril Rash    cough  . Levofloxacin Hives and Itching  . Mobic [Meloxicam] Hives and Rash  . Oxycodone Rash  . Vasotec Cough      The results of significant diagnostics from this hospitalization (including imaging, microbiology, ancillary and laboratory) are listed below for reference.    Significant Diagnostic Studies: Ct Abdomen Pelvis Wo Contrast  Result Date: 01/19/2016 CLINICAL DATA:  50 year old male with abdominal pain, nausea vomiting. Low grade fever. EXAM: CT ABDOMEN AND PELVIS WITHOUT CONTRAST TECHNIQUE: Multidetector CT imaging of the abdomen and pelvis was performed following the standard protocol without IV contrast. COMPARISON:  Abdominal CT dated 12/1915 FINDINGS: Evaluation of this exam is limited in the absence of intravenous contrast. The visualized lung bases are clear. A There is coronary vascular calcification. There is hypoattenuation of the cardiac blood pool suggestive of a degree of anemia. Clinical correlation is recommended. No intra-abdominal free air or free fluid. Focal area of hypodensity in the periphery of the left lobe of the liver along the falciform ligament appears similar to prior study and most likely represents a focal fatty infiltration. No intrahepatic biliary ductal dilatation. The gallbladder is mildly distended. No definite calcified gallstone identified. No pericholecystic fluid. The pancreas is somewhat atrophic for the patient's age. No pancreatic ductal dilatation or peripancreatic stranding. The spleen and the adrenal glands appear unremarkable. The native kidneys are atrophic. Small bilateral renal hypodense lesions are not characterized But likely represent cysts. The visualized ureters appear unremarkable. The urinary bladder is collapsed. A left hemipelvic transplant kidney is noted which appears similar in morphology to the prior study. There is no hydronephrosis or nephrolithiasis of the transplant kidney. No  peritransplant fluid collection identified. There is obliterated appearance of the renal sinus fat similar to the prior study but new compared to the CT dated 2014 concerning for failed transplant for chronic rejection. Clinical correlation is recommended. The prostate and seminal vesicles are grossly unremarkable. Small scattered colonic diverticula without active inflammatory changes. Constipation. There is no evidence of bowel obstruction or active inflammation. Appendectomy. There is moderate aortoiliac atherosclerotic disease. The abdominal aorta and IVC are grossly unremarkable on this noncontrast study. No portal venous gas  identified. There is atherosclerotic calcification of the mesentery vasculature. There is bilateral iliac artery atherosclerotic calcification. There is no retroperitoneal or mesenteric adenopathy. A 2.2 x 5.6 cm ovoid structure in the left inguinal region appears slightly larger compared to prior study. This may represent a small focal hematoma related to catheterization. Please see the aneurysm is not excluded. Duplex ultrasound may provide better evaluation. There is mild diffuse stranding and edema of the subcutaneous fat and an area of induration of the skin in the anterior pelvis. No drainable fluid collection/abscess. Right inguinal surgical clips noted. There is a small infraumbilical hernia containing a short segment of small bowel loop without evidence of obstruction or inflammation. There is degenerative changes of the spine. No acute fracture. L1 inferior endplate Schmorl's node. IMPRESSION: No acute intra-abdominal pelvic pathology. Small left inguinal hematoma slightly increased in size compared to prior study. A pseudo aneurysm is not excluded. Duplex ultrasound may provide better evaluation. Left lower quadrant transplant kidney similar to the prior CT. There is obliteration of the renal sinus fat concerning for failed transplant. Constipation.  No bowel obstruction or  active inflammation. Small scattered colonic diverticula. Diffuse subcutaneous stranding and edema. Electronically Signed   By: Anner Crete M.D.   On: 01/19/2016 21:45   Ct Abdomen Pelvis Wo Contrast  Result Date: 01/12/2016 CLINICAL DATA:  Mid abdominal pain for 2 days, initial encounter EXAM: CT ABDOMEN AND PELVIS WITHOUT CONTRAST TECHNIQUE: Multidetector CT imaging of the abdomen and pelvis was performed following the standard protocol without IV contrast. COMPARISON:  09/30/2014, 07/04/2012 FINDINGS: Lower chest: Minimal atelectatic changes are noted. No focal confluent infiltrate is seen. Coronary calcifications are noted. Hepatobiliary: No focal liver abnormality is seen. No gallstones, gallbladder wall thickening, or biliary dilatation. Pancreas: Unremarkable. No pancreatic ductal dilatation or surrounding inflammatory changes. Spleen: Normal in size without focal abnormality. Adrenals/Urinary Tract: The adrenal glands are within normal limits. The kidneys are of small and shrunken consistent with the given clinical history of end-stage renal disease. The overall appearance of the kidneys is stable from the prior exam. Left pelvic renal transplant kidney is noted. No obstructive changes are seen. Some vascular calcifications are noted. The renal sinus fat seen on the previous exam of 2014 is not visualized on today's exam and mild perinephric inflammatory changes are noted. This is of uncertain significance as the patient is known to be on current hemodialysis. These changes may be related to chronic rejection. Clinical correlation is recommended. The bladder is decompressed. Stomach/Bowel: Scattered diverticular changes noted without evidence of diverticulitis. The appendix appears to have been surgically removed. No obstructive changes are seen. Vascular/Lymphatic: Scattered aortoiliac calcifications are noted without aneurysmal dilatation. No sizable lymphadenopathy is identified. Reproductive:  Within normal limits. Other: Laxity of the anterior abdominal wall is again identified related to prior surgeries. Some soft tissue prominence is noted adjacent to the common femoral artery on the left likely related to the recent cardiac catheterization and a small subcutaneous hematoma. No pseudoaneurysm is identified. Musculoskeletal: No acute or significant osseous findings. IMPRESSION: Changes of left pelvic renal transplant although obliteration of the renal sinus fat is noted when compared with the prior exam of 2014. This is of uncertain significance but may be related to chronic projection given the patient's current hemodialysis status. Diverticulosis without diverticulitis. Shrunken native kidneys consistent with end-stage renal disease. Changes consistent with the recent cardiac catheterization in the left groin. Small hematoma is noted measuring 3.5 cm in greatest dimension. No acute abnormality is noted. Electronically  Signed   By: Inez Catalina M.D.   On: 01/12/2016 11:08   Dg Chest 2 View  Result Date: 01/19/2016 CLINICAL DATA:  Cough since 01/04/2016 EXAM: CHEST  2 VIEW COMPARISON:  01/12/2016 FINDINGS: There is mild bilateral interstitial thickening. There is no focal parenchymal opacity. There is no pleural effusion or pneumothorax. There is stable cardiomegaly. The osseous structures are unremarkable. IMPRESSION: Cardiomegaly with mild pulmonary vascular congestion. Electronically Signed   By: Kathreen Devoid   On: 01/19/2016 21:21   Dg Chest 2 View  Result Date: 01/12/2016 CLINICAL DATA:  Loss of appetite over the last 3 days, nausea, cough EXAM: CHEST  2 VIEW COMPARISON:  Chest x-ray of 01/06/2016 FINDINGS: Moderate cardiomegaly is stable. Somewhat prominent interstitial markings are noted bilaterally and on the lateral view there may be a small amount of fluid in the fissure, suggesting possible mild interstitial edema. No definite pleural effusion is seen. No bony abnormality is noted.  IMPRESSION: Cardiomegaly.  Question mild interstitial edema. Electronically Signed   By: Ivar Drape M.D.   On: 01/12/2016 12:11   Dg Chest 2 View  Result Date: 01/06/2016 CLINICAL DATA:  Cough for 2 weeks since discharge from hospital. Shortness of breath. EXAM: CHEST  2 VIEW COMPARISON:  Chest radiograph 01/01/2016 FINDINGS: Right IJ central venous catheter is been removed. Cardiomediastinal silhouette remains enlarged. No focal airspace consolidation or pulmonary edema. No pneumothorax or sizable pleural effusion. IMPRESSION: No active cardiopulmonary disease. Electronically Signed   By: Ulyses Jarred M.D.   On: 01/06/2016 22:39   Ct Soft Tissue Neck Wo Contrast  Result Date: 12/27/2015 CLINICAL DATA:  50 year old male intubated a few days ago, sensation of throat closing up. Stridor. Initial encounter. EXAM: CT NECK WITHOUT CONTRAST TECHNIQUE: Multidetector CT imaging of the neck was performed following the standard protocol without intravenous contrast. COMPARISON:  Chest CTA 10/02/2014. FINDINGS: Large body habitus. Pharynx and larynx: The vocal cords are in close apposition (series 2, image 85) but this appears similar to the appearance on the 10/02/2014 chest CTA (series 5, image 5 of that exam). The supraglottic larynx and epiglottis are normal. The subglottic trachea does appear diminutive, but again similar to the prior chest CTA. Compare the Highly atelectatic appearance of the trachea on series 2, image 111 today to series 5, image 33 on the prior chest CTA. 7 Pharyngeal contours are within normal limits. Negative parapharyngeal and retropharyngeal spaces. Salivary glands: Negative sublingual space, submandibular glands and parotid glands. Thyroid: Diminutive common normal. Lymph nodes: Negative.  No cervical lymphadenopathy. Vascular: Right IJ approach dual lumen catheter partially visualized. Calcified distal cervical ICA atherosclerosis. Calcified ICA siphons and distal vertebral arteries.  Vasculature not otherwise evaluated in the absence of IV contrast. Limited intracranial: Negative. Visualized orbits: Postoperative changes to both globes. Mastoids and visualized paranasal sinuses: Small mucous retention cyst right sphenoid sinus. Other paranasal sinuses are clear. Partial opacification of both tympanic cavities and mastoid air cells. The ossicles appear to remain intact. The external auditory canals appear fairly normal. Skeleton:  No acute osseous abnormality identified. Upper chest: Stable visible lung apices. Partially visible chronic mediastinal lipomatosis. IMPRESSION: 1. Tracheomalacia. Highly atelectatic appearance of the trachea today, but not significantly changed from a June Chest CTA (see series 2, image 111 today and series 5, image 33 on the 10/02/2014 chest CTA). 2. Likewise, the glottis appears stable since June. The supraglottic larynx is normal. No laryngeal trauma suspected. 3. Calcified atherosclerosis of the distal ICAs and vertebral arteries. Electronically Signed  By: Genevie Ann M.D.   On: 12/27/2015 18:46   Nm Myocar Multi W/spect W/wall Motion / Ef  Result Date: 12/29/2015  There was no ST segment deviation noted during stress.  No T wave inversion was noted during stress.  Defect 1: There is a medium defect of moderate severity present in the mid anterior, mid anterolateral, apical anterior and apical lateral location.  Findings consistent with apical anterolateral ischemia and prior apical anterolateral myocardial infarction.  This is an intermediate risk study.  The left ventricular ejection fraction is normal (55-65%).  Nuclear stress EF: 64%.    Dg Chest Port 1 View  Result Date: 01/01/2016 CLINICAL DATA:  Cardiac catheterization with stent placement, postprocedural. EXAM: PORTABLE CHEST 1 VIEW COMPARISON:  12/23/2015 FINDINGS: Right IJ dialysis catheter tip:  Right atrium. Prior nasogastric tube is been removed. Prior left IJ line has been removed. The  patient is rotated to the right on today's radiograph, reducing diagnostic sensitivity and specificity. Mild to moderate enlargement of the cardiopericardial silhouette, without edema. No pneumothorax. Linear subsegmental atelectasis or scarring at the left lung base, otherwise the lungs appear clear. IMPRESSION: 1. Mild-to-moderate enlargement of the cardiopericardial silhouette but without current evidence of edema or pulmonary venous hypertension. 2. Right IJ dialysis catheter tip:  Right atrium. Electronically Signed   By: Van Clines M.D.   On: 01/01/2016 19:43   Dg Chest Port 1 View  Result Date: 12/23/2015 CLINICAL DATA:  Respiratory failure. EXAM: PORTABLE CHEST 1 VIEW COMPARISON:  12/22/2015. FINDINGS: Endotracheal tube, NG tube, left eye J line, large caliber right IJ line in stable position. Cardiomegaly with mild pulmonary venous congestion and interstitial prominence suggesting mild congestive heart failure. Small left pleural effusion. Chest is unchanged from prior exam. No pneumothorax . IMPRESSION: 1. Lines and tubes in stable position. 2. Persistent changes of mild congestive heart failure with cardiomegaly, mild pulmonary venous congestion, mild interstitial edema and small left pleural effusion. No interim change from prior exam. Electronically Signed   By: Marcello Moores  Register   On: 12/23/2015 06:58    Microbiology: No results found for this or any previous visit (from the past 240 hour(s)).   Labs: Basic Metabolic Panel:  Recent Labs Lab 01/19/16 1439 01/20/16 0745  NA 132* 133*  K 4.2 5.2*  CL 93* 94*  CO2 28 26  GLUCOSE 89 108*  BUN 16 22*  CREATININE 7.89* 9.64*  CALCIUM 8.5* 8.4*   Liver Function Tests:  Recent Labs Lab 01/19/16 1439 01/20/16 0745  AST 21 24  ALT 14* 12*  ALKPHOS 117 108  BILITOT 0.5 0.6  PROT 6.6 6.5  ALBUMIN 2.8* 2.5*    Recent Labs Lab 01/19/16 1439  LIPASE 25   No results for input(s): AMMONIA in the last 168  hours. CBC:  Recent Labs Lab 01/19/16 1439 01/20/16 0745  WBC 5.4 5.1  HGB 9.1* 8.3*  HCT 29.3* 26.8*  MCV 93.6 93.7  PLT 319 325   Cardiac Enzymes:  Recent Labs Lab 01/20/16 0745  TROPONINI 0.04*   BNP: BNP (last 3 results)  Recent Labs  02/25/15 0003  BNP 444.3*    ProBNP (last 3 results) No results for input(s): PROBNP in the last 8760 hours.  CBG:  Recent Labs Lab 01/20/16 1847 01/20/16 2031 01/21/16 0010 01/21/16 0758 01/21/16 1145  GLUCAP 114* 163* 240* 153* 213*    Signed:  Velvet Bathe MD.  [facility] 01/21/2016, 2:28 PM

## 2016-01-21 NOTE — Care Management Obs Status (Addendum)
MEDICARE OBSERVATION STATUS NOTIFICATION Late entry.   Patient Details  Name: Dale [name] MRN: [id number] Date of Birth: 24-Jan-1966   Medicare Observation Status Notification Given:  Yes Info given to pt on 01/20/2016 @ 5:10pm.  01/21/2016 OBS status discussed with pt , who feels too ill to sign.   Keelia Graybill, Rory Percy, RN 01/21/2016, 9:57 AM

## 2016-01-21 NOTE — Progress Notes (Signed)
Patient Discharge: Disposition: Patient discharged to home with wife. Education: Reviewed medications, prescriptions, follow-up appointments and discharge instructions, understood and acknowledged. IV: Discontinued IV before discharge. Telemetry: N/A Transportation: Patient escorted in w/c accompanied by the wife. Belongings: Patient took all his belongings with him.

## 2016-01-23 DIAGNOSIS — N186 End stage renal disease: Secondary | ICD-10-CM | POA: Diagnosis not present

## 2016-01-23 DIAGNOSIS — Z992 Dependence on renal dialysis: Secondary | ICD-10-CM | POA: Diagnosis not present

## 2016-01-23 DIAGNOSIS — T8612 Kidney transplant failure: Secondary | ICD-10-CM | POA: Diagnosis not present

## 2016-01-25 LAB — CULTURE, BLOOD (ROUTINE X 2)
Culture: NO GROWTH
Culture: NO GROWTH

## 2016-01-26 ENCOUNTER — Encounter: Payer: Self-pay | Admitting: Internal Medicine

## 2016-01-26 ENCOUNTER — Ambulatory Visit (INDEPENDENT_AMBULATORY_CARE_PROVIDER_SITE_OTHER): Payer: Medicare Other | Admitting: Internal Medicine

## 2016-01-26 VITALS — BP 136/74 | HR 88 | Ht 68.0 in | Wt 286.0 lb

## 2016-01-26 DIAGNOSIS — G4733 Obstructive sleep apnea (adult) (pediatric): Secondary | ICD-10-CM | POA: Diagnosis not present

## 2016-01-26 DIAGNOSIS — R0602 Shortness of breath: Secondary | ICD-10-CM | POA: Diagnosis not present

## 2016-01-26 DIAGNOSIS — R509 Fever, unspecified: Secondary | ICD-10-CM

## 2016-01-26 MED ORDER — ALBUTEROL SULFATE HFA 108 (90 BASE) MCG/ACT IN AERS: 2.0000 | INHALATION_SPRAY | Freq: Four times a day (QID) | RESPIRATORY_TRACT | 6 refills | Status: DC | PRN

## 2016-01-26 MED ORDER — BENZONATATE 100 MG PO CAPS: 100.0000 mg | ORAL_CAPSULE | Freq: Two times a day (BID) | ORAL | 0 refills | Status: DC | PRN

## 2016-01-26 NOTE — Progress Notes (Signed)
Subjective:    Patient ID: Dale [name], male    DOB: 1966/03/24, 50 y.o.   MRN: [id number]  HPI   OV 01/26/2016  Chief Complaint  Patient presents with  . Hospitalization Follow-up    pt states breathing has worsen since being discharged. pt c/o increase sob, chest tightness, non prod cough, mild wheezing, weakness & fatigue. temp of 100.3 this morning.   50 year-old morbidly obese male incisional disease on dialysis for 3 years. History is provided by review of the chart and also talking to his wife and him. It is unclear why exactly he is here today for pulmonary evaluation. He appears to have significant gastroparesis. He was originally admitted 08/28/2017for non-STEMI for non-STEMI and volume overload and emergently intubated and extubated 2 days later. He was discharged on 01/04/2016. Since then he says he's been having low-grade daily intermittent fevers for which he feels nobody is paying any attention. He also has chronic cough. There is not otherwise specified. He takes [employer] for this. He was then readmitted 01/19/2016 for gastroparesis related nausea and vomiting and discharged 01/21/2016. However his fever still continue. He is not on antibiotics. His dialysis catheter has been removed. He uses a left upper extremity AV graft. He and his wife state that he does have sleep apnea diagnosed over 10 years ago. Details of the sleep study are unknown. He has never had a sleep study since then. He does not tolerate CPAP due to claustrophobia. In addition he also tells me that he was a difficult intubation and he has an ENT appointment pending. He also tells me there was some abnormal maladies in his left lung lower part during the hospitalizations in September 2017 but review of the chart and visualization of the x-ray shows only vascular congestion. CT abdomen lung cut does not report any pulmonary issues.     has a past medical history of Anemia of chronic disease; Arthritis;  CAD (coronary artery disease); Cholecystitis; Chronic diastolic CHF (congestive heart failure) ([place]); Claustrophobia; Difficult intubation; ESRD (end stage renal disease) ([place]); Fibromyalgia; GERD (gastroesophageal reflux disease); Gout; History of blood transfusion; HLD (hyperlipidemia); Hypertension; Hypothyroidism; Inguinal lymphadenopathy; Insulin dependent diabetes mellitus ([place]); OSA (obstructive sleep apnea); Pericardial effusion; and Pneumonia (~ 2007).   reports that he has never smoked. He has never used smokeless tobacco.  Past Surgical History:  Procedure Laterality Date  . AMPUTATION OF REPLICATED TOES Right   . ANGIOPLASTY  04/09/2014   RCA   . APPENDECTOMY  ~ 2006  . ARTERIOVENOUS GRAFT PLACEMENT Left 1993?   forearm  . ARTERIOVENOUS GRAFT PLACEMENT Right 1993?   leg  . ARTERIOVENOUS GRAFT PLACEMENT Left    Thigh  . Arteriovenous Graft Removed Left    Thigh  . AV FISTULA PLACEMENT  07/29/2011   Procedure: ARTERIOVENOUS (AV) FISTULA CREATION;  Surgeon: Mal Misty, MD;  Location: [place];  Service: Vascular;  Laterality: Right;  Brachial cephalic  . AV FISTULA PLACEMENT Left 01/20/2015   Procedure: LIGATION OF RIGHT ARTERIOVENOUS FISTULA WITH EXCISION OF ANEURYSM;  Surgeon: Angelia Mould, MD;  Location: [place];  Service: Vascular;  Laterality: Left;  . AV FISTULA PLACEMENT Left 04/30/2015   Procedure: CREATION OF LEFT ARM BRACHIO-CEPHALIC ARTERIOVENOUS (AV) FISTULA ;  Surgeon: Angelia Mould, MD;  Location: [place];  Service: Vascular;  Laterality: Left;  . AV Graft PLACEMENT Left 1993?   "attempted one in my wrist; didn't take"  . BELOW KNEE LEG AMPUTATION Left 2010  .  CARDIAC CATHETERIZATION  03/19/2014  . CARDIAC CATHETERIZATION  03/19/2014   Procedure: CORONARY BALLOON ANGIOPLASTY;  Surgeon: Burnell Blanks, MD;  Location: [facility] CATH LAB;  Service: Cardiovascular;;  . CARDIAC CATHETERIZATION N/A 01/01/2016   Procedure: Left Heart Cath and Coronary  Angiography;  Surgeon: Troy Sine, MD;  Location: [place] CV LAB;  Service: Cardiovascular;  Laterality: N/A;  . CARDIAC CATHETERIZATION N/A 01/01/2016   Procedure: Coronary Stent Intervention;  Surgeon: Troy Sine, MD;  Location: [place] CV LAB;  Service: Cardiovascular;  Laterality: N/A;  . CARPAL TUNNEL RELEASE Bilateral after 2005  . CATARACT EXTRACTION W/ INTRAOCULAR LENS  IMPLANT, BILATERAL Bilateral 1990's  . COLONOSCOPY  08/29/2011   Procedure: COLONOSCOPY;  Surgeon: Jerene Bears, MD;  Location: [place];  Service: Gastroenterology;  Laterality: N/A;  . CORONARY ANGIOPLASTY WITH STENT PLACEMENT  04/09/2014       ptca/des mid lad   . ESOPHAGOGASTRODUODENOSCOPY N/A 12/25/2015   Procedure: ESOPHAGOGASTRODUODENOSCOPY (EGD);  Surgeon: Mauri Pole, MD;  Location[facility] ENDOSCOPY;  Service: Endoscopy;  Laterality: N/A;  bedside  . EYE SURGERY Bilateral    cataract  . FEMORAL ARTERY REPAIR  04/09/2014   ANGIOSEAL  . INSERTION OF DIALYSIS CATHETER  08/01/2011   Procedure: INSERTION OF DIALYSIS CATHETER;  Surgeon: Rosetta Posner, MD;  Location: [place];  Service: Vascular;  Laterality: Right;  insertion of dialysis catheter on right internal jugular vein  . INSERTION OF DIALYSIS CATHETER Right 01/20/2015   Procedure: INSERTION OF DIALYSIS CATHETER - RIGHT INTERNAL JUGULAR ;  Surgeon: Angelia Mould, MD;  Location: [place];  Service: Vascular;  Laterality: Right;  . KIDNEY TRANSPLANT  04/25/1993  . LEFT HEART CATHETERIZATION WITH CORONARY ANGIOGRAM N/A 03/19/2014   Procedure: LEFT HEART CATHETERIZATION WITH CORONARY ANGIOGRAM;  Surgeon: Burnell Blanks, MD;  Location: [facility] CATH LAB;  Service: Cardiovascular;  Laterality: N/A;  . LYMPH NODE BIOPSY  08/10/2011   Procedure: LYMPH NODE BIOPSY;  Surgeon: Merrie Roof, MD;  Location: [place];  Service: General;  Laterality: Left;  left groin lymph biopsy  . NEUROPLASTY / TRANSPOSITION ULNAR NERVE AT ELBOW Left after 2005  .  PERCUTANEOUS CORONARY ROTOBLATOR INTERVENTION (PCI-R) N/A 04/09/2014   Procedure: PERCUTANEOUS CORONARY ROTOBLATOR INTERVENTION (PCI-R);  Surgeon: Burnell Blanks, MD;  Location: [facility] CATH LAB;  Service: Cardiovascular;  Laterality: N/A;  . PERIPHERAL VASCULAR CATHETERIZATION N/A 12/25/2014   Procedure: Upper Extremity Venography;  Surgeon: Conrad Chilhowie, MD;  Location: [place] CV LAB;  Service: Cardiovascular;  Laterality: N/A;  . PERITONEAL CATHETER INSERTION    . PERITONEAL CATHETER REMOVAL    . REVISON OF ARTERIOVENOUS FISTULA Right 5/40/9811   Procedure: PLICATION / REVISION OF ARTERIOVENOUS FISTULA;  Surgeon: Angelia Mould, MD;  Location: [place];  Service: Vascular;  Laterality: Right;  . REVISON OF ARTERIOVENOUS FISTULA Left 09/01/2015   Procedure: SUPERFICIALIZATION OF LEFT ARM BRACHIOCEPHALIC ARTERIOVENOUS FISTULA;  Surgeon: Angelia Mould, MD;  Location: [place];  Service: Vascular;  Laterality: Left;  . SHOULDER ARTHROSCOPY Left ~ 2006   frozen  . TOE AMPUTATION Bilateral after 2005  . UMBILICAL HERNIA REPAIR  1990's  . VENOGRAM Right 07/28/2011   Procedure: VENOGRAM;  Surgeon: Conrad Evans, MD;  Location: [facility] CATH LAB;  Service: Cardiovascular;  Laterality: Right;  . VITRECTOMY Bilateral     Allergies  Allergen Reactions  . Contrast Media [Iodinated Diagnostic Agents] Rash and Itching    Systemic itching and transient rash appearance within  hours of receiving contrast  Systemic itching and transient rash appearance within hours of receiving contrast  Redman Syndrome  . Rocephin [Ceftriaxone] Itching  . Amoxicillin Itching and Rash    .Has patient had a PCN reaction causing immediate rash, facial/tongue/throat swelling, SOB or lightheadedness with hypotension: Yes Has patient had a PCN reaction causing severe rash involving mucus membranes or skin necrosis: No Has patient had a PCN reaction that required hospitalization No Has patient had a PCN reaction occurring  within the last 10 years: No If all of the above answers are "NO", then may proceed with Cephalosporin use.  . Buprenorphine Hcl Nausea Only and Hives  . Clopidogrel Rash  . Hydrocodone Rash  . Morphine Nausea Only, Rash and Hives  . Adhesive [Tape] Other (See Comments) and Itching    Blisters on arm from adhesive tape at dialysis Blisters on arm from adhesive tape at dialysis  . Prednisone Other (See Comments)    Increases sugar levels  . Ciprofloxacin Itching and Rash  . Clindamycin/Lincomycin Hives and Itching  . Codeine Hives, Itching and Rash  . Doxycycline Rash  . Enalapril Rash    cough  . Levofloxacin Hives and Itching  . Mobic [Meloxicam] Hives and Rash  . Oxycodone Rash  . Vasotec Cough    Immunization History  Administered Date(s) Administered  . Influenza,inj,Quad PF,36+ Mos 01/21/2016  . Influenza-Unspecified 02/03/2014    Family History  Problem Relation Age of Onset  . Colon polyps Father   . Diabetes Father   . Hypertension Father   . Hypertension Mother   . Diabetes Mother   . Hyperlipidemia Mother   . Breast cancer Maternal Aunt   . Cancer Maternal Aunt     Breast and Bone  . Malignant hyperthermia Neg Hx   . Heart attack Neg Hx   . Stroke Neg Hx      Current Outpatient Prescriptions:  .  acetaminophen (TYLENOL) 500 MG tablet, Take 1,000 mg by mouth every 6 (six) hours as needed for moderate pain or headache., Disp: , Rfl:  .  allopurinol (ZYLOPRIM) 100 MG tablet, Take 200 mg by mouth daily. , Disp: , Rfl:  .  aspirin EC 81 MG tablet, Take 81 mg by mouth at bedtime., Disp: , Rfl:  .  atorvastatin (LIPITOR) 80 MG tablet, Take 1 tablet (80 mg total) by mouth daily at 6 PM., Disp: 30 tablet, Rfl: 0 .  calcium acetate (PHOSLO) 667 MG capsule, Take 1,334-2,001 mg by mouth See admin instructions. Take 3 capsules (2001 mg) by mouth with meals and 2 capsule (1334mg  ) with snacks, Disp: , Rfl:  .  diphenhydrAMINE (BENADRYL) 25 mg capsule, Take 1-2 capsules  (25-50 mg total) by mouth every 6 (six) hours as needed for itching. (Patient taking differently: Take 50 mg by mouth 2 (two) times daily as needed for itching or sleep. ), Disp: 30 capsule, Rfl: 0 .  fenofibrate (TRICOR) 48 MG tablet, Take 1 tablet (48 mg total) by mouth daily., Disp: 30 tablet, Rfl: 0 .  Ferric Citrate (AURYXIA) 1 GM 210 MG(Fe) TABS, Take 2 g by mouth 3 (three) times daily with meals., Disp: , Rfl:  .  gabapentin (NEURONTIN) 300 MG capsule, Take 300 mg by mouth 3 (three) times daily., Disp: , Rfl:  .  GLUCAGON EMERGENCY 1 MG injection, Inject 1 mg into the muscle daily as needed (low blood sugar). , Disp: , Rfl:  .  HYDROmorphone (DILAUDID) 2 MG tablet, Take 1 tablet (2  mg total) by mouth 3 (three) times daily as needed for severe pain. (Patient taking differently: Take 2 mg by mouth 2 (two) times daily. Take 1 tablet (2 mg) by mouth daily at bedtime, may take an additional dose during the day as needed for severe pain), Disp: 20 tablet, Rfl: 0 .  Insulin Human (INSULIN PUMP) 100 unit/ml SOLN, Inject 1 each into the skin continuous. Humalog, Disp: , Rfl:  .  insulin lispro (HUMALOG) 100 UNIT/ML injection, Inject 175 Units into the skin continuous. Up to 175 units continuous in pump,  Basal rates area as follows:  2400 1.5 units/hr, 0300 1.9 units/hr, 0800 1.45 units/hr, 1200 1.625 units/hr, Disp: , Rfl:  .  levothyroxine (SYNTHROID, LEVOTHROID) 100 MCG tablet, Take 100 mcg by mouth at bedtime. , Disp: , Rfl:  .  loratadine-pseudoephedrine (CLARITIN-D 12-HOUR) 5-120 MG tablet, Take 1 tablet by mouth 2 (two) times daily., Disp: , Rfl:  .  metoCLOPramide (REGLAN) 5 MG tablet, Take 2 tablets (10 mg total) by mouth 4 (four) times daily - after meals and at bedtime., Disp: 60 tablet, Rfl: 0 .  metoprolol tartrate (LOPRESSOR) 25 MG tablet, Take 0.5 tablets (12.5 mg total) by mouth 2 (two) times daily., Disp: 60 tablet, Rfl: 0 .  midodrine (PROAMATINE) 10 MG tablet, Take 20 mg by mouth every  Monday, Wednesday, and Friday. On dialysis days, Disp: , Rfl:  .  multivitamin (RENA-VIT) TABS tablet, Take 1 tablet by mouth daily., Disp: , Rfl:  .  nitroGLYCERIN (NITROSTAT) 0.4 MG SL tablet, Place 0.4 mg under the tongue every 5 (five) minutes as needed for chest pain. , Disp: , Rfl:  .  Nutritional Supplements (FEEDING SUPPLEMENT, NEPRO CARB STEADY,) LIQD, Take 237 mLs by mouth daily., Disp: 30 Can, Rfl: 0 .  pantoprazole (PROTONIX) 40 MG tablet, , Disp: , Rfl: 1 .  promethazine (PHENERGAN) 25 MG tablet, Take 1 tablet (25 mg total) by mouth every 6 (six) hours as needed for nausea or vomiting. For nausea, Disp: 30 tablet, Rfl: 0 .  rOPINIRole (REQUIP) 2 MG tablet, Take 2 mg by mouth daily with supper. , Disp: , Rfl:  .  ticagrelor (BRILINTA) 90 MG TABS tablet, Take 1 tablet (90 mg total) by mouth 2 (two) times daily. Please contact cardiology Dr  Camillia Herter office for prior authorization needs or refills., Disp: 60 tablet, Rfl: 0 No current facility-administered medications for this visit.   Facility-Administered Medications Ordered in Other Visits:  .  0.9 %  sodium chloride infusion, , Intravenous, Continuous, Angelia Mould, MD .  6 CHG cloth bath - Day of surgery, , , Once **AND** Chlorhexidine Gluconate Cloth 2 % PADS 6 each, 6 each, Topical, Once, Angelia Mould, MD    Review of Systems  Constitutional: Positive for fever. Negative for unexpected weight change.  HENT: Positive for nosebleeds and trouble swallowing. Negative for congestion, dental problem, ear pain, postnasal drip, rhinorrhea, sinus pressure, sneezing and sore throat.   Eyes: Negative for redness and itching.  Respiratory: Positive for cough, chest tightness, shortness of breath and wheezing.   Cardiovascular: Positive for leg swelling. Negative for palpitations.  Gastrointestinal: Positive for nausea. Negative for vomiting.  Genitourinary: Negative for dysuria.  Musculoskeletal: Negative for joint  swelling.  Skin: Negative for rash.  Neurological: Negative for headaches.  Hematological: Does not bruise/bleed easily.  Psychiatric/Behavioral: Negative for dysphoric mood. The patient is not nervous/anxious.        Objective:   Physical Exam Vitals:  01/26/16 1558  BP: 136/74  Pulse: 88  SpO2: 93%  Weight: 286 lb (129.7 kg)  Height: 5\' 8"  (1.727 m)    Obese deconditioned male sitting in a wheelchair Musculoskeletal: Left lower extreme edema prosthesis present Chest exam: Dialysis catheter scar present on the right infraclavicular area Left upper extremity AV graft present Chest exam: Clear to auscultation bilaterally Cardiovascular normal heart sounds Skin exam: Intact on the exposed areas Psychiatric: Frustrated male with all his health conditions       Assessment & Plan:     ICD-9-CM ICD-10-CM   1. FUO (fever of unknown origin) 780.60 R50.9 CBC     Basic Metabolic Panel (BMET)     Ambulatory referral to Infectious Disease  2. Shortness of breath 786.05 R06.02 CBC     Ambulatory referral to Infectious Disease     CT CHEST HIGH RESOLUTION  3. Obstructive sleep apnea 327.23 G47.33     FUO (fever of unknown origin) - do cbc, bmet blood work 01/26/2016 - refer ID dept  - go to ER if gets worse or concerning while waiting for ID appt  Shortness of breath - do HRCT chest   Obstructive sleep apnea - refer sleep doc in our practice  Followup - after CT chest to see me or an APP   Dr. Brand Males, M.D., [facility].C.P Pulmonary and Critical Care Medicine Staff Physician [place] [facility] Pager: [phone], If no answer or between  15:00h - 7:00h: call [phone]  01/26/2016 6:18 PM

## 2016-01-26 NOTE — Patient Instructions (Addendum)
ICD-9-CM ICD-10-CM   1. FUO (fever of unknown origin) 780.60 R50.9   2. Shortness of breath 786.05 R06.02   3. Obstructive sleep apnea 327.23 G47.33    FUO (fever of unknown origin) - do cbc, bmet blood work 01/26/2016 - refer ID dept  - go to ER if gets worse or concerning while waiting for ID appt  Shortness of breath - do HRCT chest   Obstructive sleep apnea - refer sleep doc in our practice  Followup - after CT chest to see me or an APP

## 2016-01-27 ENCOUNTER — Ambulatory Visit: Payer: Medicare Other | Admitting: Nurse Practitioner

## 2016-01-27 NOTE — Progress Notes (Signed)
Cardiology Office Note    Date:  01/28/2016   ID:  Shameek Nyquist, DOB Nov 19, 1965, MRN [id number]  PCP:  [name], MD  Cardiologist:  Dr. Julianne Handler  CC: post hospital f/u  History of Present Illness:  Milt Coye is a 50 y.o. male with a history of Type I DM with gastroparesis, HTN, sleep apnea, ESRD on HD, fibromyalgia, chronic anemia, PAD status post left BKA and CAD s/p  DES to mRCA (01/2014) and DES to diagonal (01/01/16) who presents to clinic for post hospital follow up.  In 01/2014 he had complaints of dyspnea with exertion. Myoview demonstrated inferior, apical and apical lateral ischemia (mod risk).LHC demonstrated severe RCA stenosis that was treated with POBA.He ultimately underwent PCI with rotablator atherectomy and DES to the mid RCA.  Patient had significant IV dye allergic reaction post PCI.   Admitted 5/16 with chest pain. Cardiac markers remained normal. Echocardiogram demonstrated normal EF with normal wall motion.   He was admitted 08/28-9/11/17. He presented to [facility] with left arm pain, potassium of 7, hemoglobin of 6.9. Rec'd multiple units of blood with persistent anemia. Had to be intubated in HD unit. Trop peak 18 and evidence of volume overload. EGD with no source of bleed. He underwent nuclear stress test which was abnormal with subsequent cath on 01/01/16 which showed 2V obstructive coronary disease with mild calcification of the proximal LAD with 30% narrowing, progressive 95% mid diagonal stenosis, 30% mid LAD narrowing; normal left circumflex coronary artery; and calcified large RCA with a widely patent stent in the mid RCA at the site of previous atherectomy, 30% acute margin narrowing, and 50% inferior branch PDA stenosis. He underwent successful PCI to the diagonal vessel with ultimate insertion of a 2.2515 mm Xience Alpine DES stent postdilated with a 2.4 mm with the 95% stenosis being reduced to 0%. He was started on ASA and Brilinta. He was  discharged on 01/04/2016.   He was recently admitted 9/26-9/28/17 for N/V felt 2/2 diabetic gastroparesis.   Today he presents to clinic for follow up. He has an allergy to plavix and was previously on Effient and did quite well. He has been having nausea since the stent was placed and he strongly feels that it was related to the Brilinta and would like to be switched to Effient. He has just hasn't felt good and he has no appetite. Nothing sounds good. Foods makes him feel nauseated. He saw Dr. Chase Caller yesterday for coughing and also fever of unknown origin. Temp peaks around 100 at night. Labs were ordered but never drawn. CT scan was ordered. He saw PCP today he was concerned. He has had no chest pain but has a lot of SOB. Inhaler calms things down. No more left arm pain. No dizziness or syncope.  Mild edema but volume managed by HD. He is upset and feels like everyone just "tries to slap a band aid on the problem" vs trying to figure it out.      Past Medical History:  Diagnosis Date  . Anemia of chronic disease   . Arthritis    "hands, right knee" (03/19/2014)  . CAD (coronary artery disease)    a. Lexiscan Myoview (11/15):  Inf, apical cap and apical lateral ischemia, EF 56%, inf HK, Moderate Risk;  b. LHC (11/15):  mid to dist Dx 80%, mid RCA 99% (functional CTO), dist RCA 80% >> PCI: balloon angioplasty to mid RCA (could not deliver stent) - plan staged PCI with  HSRA of RCA; Dx to be tx medically >> PCI: Rotoblator atherectomy/DES to [facility]  . Cholecystitis    a. 08/27/2011  . Chronic diastolic CHF (congestive heart failure) ([place])    a. Echo 3/13:  EF 55-60%;  b. Echo (11/15):  Mild LVH, EF 60-65%, no RWMA, Gr 1 DD, MAC, mild LAE, normal RVF, mild RAE;  c.  Echo 5/16:  severe LVH, EF 55-60%, no RWMA, Gr 1 DD, MAC, mild LAE  . Claustrophobia    when things get around his face.   . Difficult intubation    only once in 2015 at [facility] haven't had a problem since then  . ESRD (end stage  renal disease) ([place])    a. 1995 s/p cadaveric transplant w/ susbequent failure after 18 yrs;  b.Dialysis initiated 07/2011 [facility] M-W-F  . Fibromyalgia   . GERD (gastroesophageal reflux disease)   . Gout    PMH  . History of blood transfusion   . HLD (hyperlipidemia)   . Hypertension   . Hypothyroidism   . Inguinal lymphadenopathy    a. bilateral - s/p biopsy 07/2011  . Insulin dependent diabetes mellitus (HCC)    Type I  . OSA (obstructive sleep apnea)    adjustable bed  . Pericardial effusion    a.  Small by CT 08/22/11;  b.  Large by CT 08/27/11  . Pneumonia ~ 2007    Past Surgical History:  Procedure Laterality Date  . AMPUTATION OF REPLICATED TOES Right   . ANGIOPLASTY  04/09/2014   RCA   . APPENDECTOMY  ~ 2006  . ARTERIOVENOUS GRAFT PLACEMENT Left 1993?   forearm  . ARTERIOVENOUS GRAFT PLACEMENT Right 1993?   leg  . ARTERIOVENOUS GRAFT PLACEMENT Left    Thigh  . Arteriovenous Graft Removed Left    Thigh  . AV FISTULA PLACEMENT  07/29/2011   Procedure: ARTERIOVENOUS (AV) FISTULA CREATION;  Surgeon: Mal Misty, MD;  Location: [place];  Service: Vascular;  Laterality: Right;  Brachial cephalic  . AV FISTULA PLACEMENT Left 01/20/2015   Procedure: LIGATION OF RIGHT ARTERIOVENOUS FISTULA WITH EXCISION OF ANEURYSM;  Surgeon: Angelia Mould, MD;  Location: [place];  Service: Vascular;  Laterality: Left;  . AV FISTULA PLACEMENT Left 04/30/2015   Procedure: CREATION OF LEFT ARM BRACHIO-CEPHALIC ARTERIOVENOUS (AV) FISTULA ;  Surgeon: Angelia Mould, MD;  Location: [place];  Service: Vascular;  Laterality: Left;  . AV Graft PLACEMENT Left 1993?   "attempted one in my wrist; didn't take"  . BELOW KNEE LEG AMPUTATION Left 2010  . CARDIAC CATHETERIZATION  03/19/2014  . CARDIAC CATHETERIZATION  03/19/2014   Procedure: CORONARY BALLOON ANGIOPLASTY;  Surgeon: Burnell Blanks, MD;  Location: [facility] CATH LAB;  Service: Cardiovascular;;  . CARDIAC CATHETERIZATION N/A 01/01/2016    Procedure: Left Heart Cath and Coronary Angiography;  Surgeon: Troy Sine, MD;  Location: [place] CV LAB;  Service: Cardiovascular;  Laterality: N/A;  . CARDIAC CATHETERIZATION N/A 01/01/2016   Procedure: Coronary Stent Intervention;  Surgeon: Troy Sine, MD;  Location: [place] CV LAB;  Service: Cardiovascular;  Laterality: N/A;  . CARPAL TUNNEL RELEASE Bilateral after 2005  . CATARACT EXTRACTION W/ INTRAOCULAR LENS  IMPLANT, BILATERAL Bilateral 1990's  . COLONOSCOPY  08/29/2011   Procedure: COLONOSCOPY;  Surgeon: Jerene Bears, MD;  Location: [place];  Service: Gastroenterology;  Laterality: N/A;  . CORONARY ANGIOPLASTY WITH STENT PLACEMENT  04/09/2014       ptca/des mid lad   .  ESOPHAGOGASTRODUODENOSCOPY N/A 12/25/2015   Procedure: ESOPHAGOGASTRODUODENOSCOPY (EGD);  Surgeon: Mauri Pole, MD;  Location: [facility] ENDOSCOPY;  Service: Endoscopy;  Laterality: N/A;  bedside  . EYE SURGERY Bilateral    cataract  . FEMORAL ARTERY REPAIR  04/09/2014   ANGIOSEAL  . INSERTION OF DIALYSIS CATHETER  08/01/2011   Procedure: INSERTION OF DIALYSIS CATHETER;  Surgeon: Rosetta Posner, MD;  Location: [place];  Service: Vascular;  Laterality: Right;  insertion of dialysis catheter on right internal jugular vein  . INSERTION OF DIALYSIS CATHETER Right 01/20/2015   Procedure: INSERTION OF DIALYSIS CATHETER - RIGHT INTERNAL JUGULAR ;  Surgeon: Angelia Mould, MD;  Location: [place];  Service: Vascular;  Laterality: Right;  . KIDNEY TRANSPLANT  04/25/1993  . LEFT HEART CATHETERIZATION WITH CORONARY ANGIOGRAM N/A 03/19/2014   Procedure: LEFT HEART CATHETERIZATION WITH CORONARY ANGIOGRAM;  Surgeon: Burnell Blanks, MD;  Location: [facility] CATH LAB;  Service: Cardiovascular;  Laterality: N/A;  . LYMPH NODE BIOPSY  08/10/2011   Procedure: LYMPH NODE BIOPSY;  Surgeon: Merrie Roof, MD;  Location: [place];  Service: General;  Laterality: Left;  left groin lymph biopsy  . NEUROPLASTY / TRANSPOSITION ULNAR  NERVE AT ELBOW Left after 2005  . PERCUTANEOUS CORONARY ROTOBLATOR INTERVENTION (PCI-R) N/A 04/09/2014   Procedure: PERCUTANEOUS CORONARY ROTOBLATOR INTERVENTION (PCI-R);  Surgeon: Burnell Blanks, MD;  Location: [facility] CATH LAB;  Service: Cardiovascular;  Laterality: N/A;  . PERIPHERAL VASCULAR CATHETERIZATION N/A 12/25/2014   Procedure: Upper Extremity Venography;  Surgeon: Conrad Kiester, MD;  Location: [place] CV LAB;  Service: Cardiovascular;  Laterality: N/A;  . PERITONEAL CATHETER INSERTION    . PERITONEAL CATHETER REMOVAL    . REVISON OF ARTERIOVENOUS FISTULA Right 12/21/9369   Procedure: PLICATION / REVISION OF ARTERIOVENOUS FISTULA;  Surgeon: Angelia Mould, MD;  Location: [place];  Service: Vascular;  Laterality: Right;  . REVISON OF ARTERIOVENOUS FISTULA Left 09/01/2015   Procedure: SUPERFICIALIZATION OF LEFT ARM BRACHIOCEPHALIC ARTERIOVENOUS FISTULA;  Surgeon: Angelia Mould, MD;  Location: [place];  Service: Vascular;  Laterality: Left;  . SHOULDER ARTHROSCOPY Left ~ 2006   frozen  . TOE AMPUTATION Bilateral after 2005  . UMBILICAL HERNIA REPAIR  1990's  . VENOGRAM Right 07/28/2011   Procedure: VENOGRAM;  Surgeon: Conrad Kaukauna, MD;  Location: [facility] CATH LAB;  Service: Cardiovascular;  Laterality: Right;  . VITRECTOMY Bilateral     Current Medications: Outpatient Medications Prior to Visit  Medication Sig Dispense Refill  . acetaminophen (TYLENOL) 500 MG tablet Take 1,000 mg by mouth every 6 (six) hours as needed for moderate pain or headache.    . albuterol (PROVENTIL HFA;VENTOLIN HFA) 108 (90 Base) MCG/ACT inhaler Inhale 2 puffs into the lungs every 6 (six) hours as needed for wheezing or shortness of breath. 1 Inhaler 6  . allopurinol (ZYLOPRIM) 100 MG tablet Take 200 mg by mouth daily.     [name] Kitchen aspirin EC 81 MG tablet Take 81 mg by mouth at bedtime.    [name] Kitchen atorvastatin (LIPITOR) 80 MG tablet Take 1 tablet (80 mg total) by mouth daily at 6 PM. 30 tablet 0  . benzonatate  (TESSALON) 100 MG capsule Take 1 capsule (100 mg total) by mouth 2 (two) times daily as needed for cough. 60 capsule 0  . calcium acetate (PHOSLO) 667 MG capsule Take 1,334-2,001 mg by mouth See admin instructions. Take 3 capsules (2001 mg) by mouth with meals and 2 capsule (1334mg  ) with snacks    .  diphenhydrAMINE (BENADRYL) 25 mg capsule Take 1-2 capsules (25-50 mg total) by mouth every 6 (six) hours as needed for itching. (Patient taking differently: Take 50 mg by mouth 2 (two) times daily as needed for itching or sleep. ) 30 capsule 0  . fenofibrate (TRICOR) 48 MG tablet Take 1 tablet (48 mg total) by mouth daily. 30 tablet 0  . Ferric Citrate (AURYXIA) 1 GM 210 MG(Fe) TABS Take 2 g by mouth 3 (three) times daily with meals.    . gabapentin (NEURONTIN) 300 MG capsule Take 300 mg by mouth 3 (three) times daily.    [name] Kitchen GLUCAGON EMERGENCY 1 MG injection Inject 1 mg into the muscle daily as needed (low blood sugar).     [name] Kitchen HYDROmorphone (DILAUDID) 2 MG tablet Take 1 tablet (2 mg total) by mouth 3 (three) times daily as needed for severe pain. (Patient taking differently: Take 2 mg by mouth 2 (two) times daily. Take 1 tablet (2 mg) by mouth daily at bedtime, may take an additional dose during the day as needed for severe pain) 20 tablet 0  . Insulin Human (INSULIN PUMP) 100 unit/ml SOLN Inject 1 each into the skin continuous. Humalog    . insulin lispro (HUMALOG) 100 UNIT/ML injection Inject 175 Units into the skin continuous. Up to 175 units continuous in pump,  Basal rates area as follows:  2400 1.5 units/hr, 0300 1.9 units/hr, 0800 1.45 units/hr, 1200 1.625 units/hr    . levothyroxine (SYNTHROID, LEVOTHROID) 100 MCG tablet Take 100 mcg by mouth at bedtime.     [name] Kitchen loratadine-pseudoephedrine (CLARITIN-D 12-HOUR) 5-120 MG tablet Take 1 tablet by mouth 2 (two) times daily.    . metoCLOPramide (REGLAN) 5 MG tablet Take 2 tablets (10 mg total) by mouth 4 (four) times daily - after meals and at bedtime. 60  tablet 0  . midodrine (PROAMATINE) 10 MG tablet Take 20 mg by mouth every Monday, Wednesday, and Friday. On dialysis days    . multivitamin (RENA-VIT) TABS tablet Take 1 tablet by mouth daily.    . nitroGLYCERIN (NITROSTAT) 0.4 MG SL tablet Place 0.4 mg under the tongue every 5 (five) minutes as needed for chest pain.     . Nutritional Supplements (FEEDING SUPPLEMENT, NEPRO CARB STEADY,) LIQD Take 237 mLs by mouth daily. 30 Can 0  . pantoprazole (PROTONIX) 40 MG tablet   1  . promethazine (PHENERGAN) 25 MG tablet Take 1 tablet (25 mg total) by mouth every 6 (six) hours as needed for nausea or vomiting. For nausea 30 tablet 0  . rOPINIRole (REQUIP) 2 MG tablet Take 2 mg by mouth daily with supper.     . metoprolol tartrate (LOPRESSOR) 25 MG tablet Take 0.5 tablets (12.5 mg total) by mouth 2 (two) times daily. 60 tablet 0  . ticagrelor (BRILINTA) 90 MG TABS tablet Take 1 tablet (90 mg total) by mouth 2 (two) times daily. Please contact cardiology Dr  Camillia Herter office for prior authorization needs or refills. 60 tablet 0   Facility-Administered Medications Prior to Visit  Medication Dose Route Frequency Provider Last Rate Last Dose  . 0.9 %  sodium chloride infusion   Intravenous Continuous Angelia Mould, MD      . Chlorhexidine Gluconate Cloth 2 % PADS 6 each  6 each Topical Once Angelia Mould, MD         Allergies:   Contrast media [iodinated diagnostic agents]; Rocephin [ceftriaxone]; Amoxicillin; Buprenorphine hcl; Clopidogrel; Hydrocodone; Morphine; Adhesive [tape]; Prednisone; Ciprofloxacin; Clindamycin/lincomycin; Codeine; Doxycycline;  Enalapril; Levofloxacin; Mobic [meloxicam]; Oxycodone; and Vasotec   Social History   Social History  . Marital status: Married    Spouse name: N/A  . Number of children: N/A  . Years of education: N/A   Occupational History  . Disabled    Social History Main Topics  . Smoking status: Never Smoker  . Smokeless tobacco: Never Used    . Alcohol use Yes  . Drug use: No  . Sexual activity: Not Asked   Other Topics Concern  . None   Social History Narrative   Lives @ home with his wife in [place].      Family History:  [name] family history includes Breast cancer in his maternal aunt; Cancer in his maternal aunt; Colon polyps in his father; Diabetes in his father and mother; Hyperlipidemia in his mother; Hypertension in his father and mother.     ROS:   Please see the history of present illness.    ROS All other systems reviewed and are negative.   PHYSICAL EXAM:   VS:  BP (!) 146/90   Pulse 96   Ht 5\' 8"  (1.727 m)   Wt 286 lb (129.7 kg)   BMI 43.49 kg/m    GEN: Well nourished, well developed, in no acute distress; morbidly obese and chronically ill appearing HEENT: normal  Neck: no JVD, carotid bruits, or masses Cardiac: RRR; no murmurs, rubs, or gallops,no edema  Respiratory:  clear to auscultation bilaterally, normal work of breathing GI: soft, nontender, nondistended, + BS MS: no deformity or atrophy  Skin: warm and dry, no rash  S/p left BKA Neuro:  Alert and Oriented x 3, Strength and sensation are intact Psych: euthymic mood, full affect  Wt Readings from Last 3 Encounters:  01/28/16 286 lb (129.7 kg)  01/26/16 286 lb (129.7 kg)  01/20/16 278 lb 7.1 oz (126.3 kg)      Studies/Labs Reviewed:   EKG:  EKG is ordered today.  The ekg ordered today demonstrates NSR prolonged QT HR 96  Recent Labs: 02/25/2015: B Natriuretic Peptide 444.3 12/24/2015: TSH 2.489 01/04/2016: Magnesium 2.1 01/20/2016: ALT 12; BUN 22; Creatinine, Ser 9.64; Hemoglobin 8.3; Platelets 325; Potassium 5.2; Sodium 133   Lipid Panel    Component Value Date/Time   CHOL 143 02/25/2015 1400   TRIG 255 (H) 02/25/2015 1400   HDL 29 (L) 02/25/2015 1400   CHOLHDL 4.9 02/25/2015 1400   VLDL 51 (H) 02/25/2015 1400   LDLCALC 63 02/25/2015 1400    Additional studies/ records that were reviewed today include:   Echo  12/23/15: Left ventricle: Systolic function was normal. The estimated ejection fraction was in the range of 60% to 65%. Features are consistent with a pseudonormal left ventricular filling pattern, with concomitant abnormal relaxation and increased filling pressure (grade 2 diastolic dysfunction).  Nuclear stress test: 12/30/15  There was no ST segment deviation noted during stress.  No T wave inversion was noted during stress.  Defect 1: There is a medium defect of moderate severity present in the mid anterior, mid anterolateral, apical anterior and apical lateral location.  Findings consistent with apical anterolateral ischemia and prior apical anterolateral myocardial infarction.  This is an intermediate risk study.  The left ventricular ejection fraction is normal (55-65%).  Nuclear stress EF: 64%.  Conclusion 01/01/16   Ost LAD lesion, 30 %stenosed.  Dist LAD lesion, 30 %stenosed.  Prox RCA to Mid RCA lesion, 0 %stenosed.  1st RPLB lesion, 50 %stenosed.  A STENT XIENCE  ALPINE RX 4.43X54 drug eluting stent was successfully placed.  1st Diag lesion, 95 %stenosed.  Post intervention, there is a 0% residual stenosis.  Dist RCA lesion, 30 %stenosed.   Two-vessel coronary obstructive disease with mild calcification of the proximal LAD with 30% narrowing, progressive 95% mid diagonal stenosis, 30% mid LAD narrowing; normal left circumflex coronary artery; and calcified large RCA with a widely patent stent in the mid RCA at the site of previous atherectomy, 30% acute margin narrowing, and 50% inferior branch PDA stenosis.  Successful PCI to the diagonal vessel with ultimate insertion of a 2.2515 mm Xience Alpine DES stent postdilated with a 2.4 mm with the 95% stenosis being reduced to 0%.  RECOMMENDATION: The patient will continue dual antiplatelet therapy with aspirin and Brilinta.  Medical therapy for concomitant CAD.       ASSESSMENT & PLAN:    CAD:  Continue ASA 81 mg daily and Brilinta 90mg  BID. He thinks his nausea is related to Brilinta. He previously tolerated Effient well in the past so we will switch this to see if it helps. Continue statin and BB  Chronic diastolic CHF:  Volume management per dialysis.   ESRD: He is on MWF dialysis.  Essential hypertension: BP mildly elevated 140/90.  Will increase Lopressor to 12.5mg  BID --> to 25mg  BID. He will continue to hold the ngiht before HD to avoid hypotension  HLD: Continue statin.  Cough and FUO: CT scan done today and pending. Seeing Dr. Chase Caller back tomorrow. Dr. Tonna Boehringer ordered labs but never drawn. Will get here for appt tomorrow   Medication Adjustments/Labs and Tests Ordered: Current medicines are reviewed at length with the patient today.  Concerns regarding medicines are outlined above.  Medication changes, Labs and Tests ordered today are listed in the Patient Instructions below. Patient Instructions  Medication Instructions:  Your physician has recommended you make the following change in your medication:  1.  STOP the Brilinta 2.  START Effient 10 mg taking 1  3.  INCREASE the Lopressor 25 mg taking 1 tablet twice a day  Labwork: None ordered  Testing/Procedures: None ordered  Follow-Up: Your physician recommends that you schedule a follow-up appointment in: 3 MONTHS WITH DR. [name]   Any Other Special Instructions Will Be Listed Below (If Applicable).    If you need a refill on your cardiac medications before your next appointment, please call your pharmacy.      Signed, [name], PA-C  01/28/2016 [facility] [address], [place], [place]  [address] Phone: [phone]; Fax: [phone]

## 2016-01-28 ENCOUNTER — Encounter: Payer: Self-pay | Admitting: Physician Assistant

## 2016-01-28 ENCOUNTER — Ambulatory Visit: Payer: Medicare Other | Admitting: Physician Assistant

## 2016-01-28 ENCOUNTER — Ambulatory Visit (INDEPENDENT_AMBULATORY_CARE_PROVIDER_SITE_OTHER)
Admission: RE | Admit: 2016-01-28 | Discharge: 2016-01-28 | Disposition: A | Discharge status: Home / Self Care | Payer: Medicare Other | Source: Ambulatory Visit | Attending: Internal Medicine | Admitting: Internal Medicine

## 2016-01-28 ENCOUNTER — Ambulatory Visit (INDEPENDENT_AMBULATORY_CARE_PROVIDER_SITE_OTHER): Payer: Medicare Other | Admitting: Physician Assistant

## 2016-01-28 VITALS — BP 146/90 | HR 96 | Ht 68.0 in | Wt 286.0 lb

## 2016-01-28 DIAGNOSIS — R0902 Hypoxemia: Secondary | ICD-10-CM | POA: Diagnosis not present

## 2016-01-28 DIAGNOSIS — Z794 Long term (current) use of insulin: Secondary | ICD-10-CM | POA: Diagnosis not present

## 2016-01-28 DIAGNOSIS — I5032 Chronic diastolic (congestive) heart failure: Secondary | ICD-10-CM | POA: Diagnosis not present

## 2016-01-28 DIAGNOSIS — I2510 Atherosclerotic heart disease of native coronary artery without angina pectoris: Secondary | ICD-10-CM | POA: Diagnosis not present

## 2016-01-28 DIAGNOSIS — E108 Type 1 diabetes mellitus with unspecified complications: Secondary | ICD-10-CM

## 2016-01-28 DIAGNOSIS — R509 Fever, unspecified: Secondary | ICD-10-CM | POA: Diagnosis not present

## 2016-01-28 DIAGNOSIS — R0602 Shortness of breath: Secondary | ICD-10-CM

## 2016-01-28 DIAGNOSIS — N186 End stage renal disease: Secondary | ICD-10-CM | POA: Diagnosis not present

## 2016-01-28 DIAGNOSIS — I10 Essential (primary) hypertension: Secondary | ICD-10-CM

## 2016-01-28 DIAGNOSIS — R112 Nausea with vomiting, unspecified: Secondary | ICD-10-CM | POA: Diagnosis not present

## 2016-01-28 DIAGNOSIS — G4733 Obstructive sleep apnea (adult) (pediatric): Secondary | ICD-10-CM

## 2016-01-28 DIAGNOSIS — Z9989 Dependence on other enabling machines and devices: Secondary | ICD-10-CM

## 2016-01-28 DIAGNOSIS — R918 Other nonspecific abnormal finding of lung field: Secondary | ICD-10-CM | POA: Diagnosis not present

## 2016-01-28 DIAGNOSIS — R05 Cough: Secondary | ICD-10-CM | POA: Diagnosis not present

## 2016-01-28 DIAGNOSIS — E785 Hyperlipidemia, unspecified: Secondary | ICD-10-CM

## 2016-01-28 DIAGNOSIS — D638 Anemia in other chronic diseases classified elsewhere: Secondary | ICD-10-CM | POA: Diagnosis not present

## 2016-01-28 DIAGNOSIS — E118 Type 2 diabetes mellitus with unspecified complications: Secondary | ICD-10-CM | POA: Diagnosis not present

## 2016-01-28 MED ORDER — PRASUGREL HCL 10 MG PO TABS: 10.0000 mg | ORAL_TABLET | Freq: Every day | ORAL | 3 refills | Status: DC

## 2016-01-28 MED ORDER — METOPROLOL TARTRATE 25 MG PO TABS: 25.0000 mg | ORAL_TABLET | Freq: Two times a day (BID) | ORAL | 11 refills | Status: DC

## 2016-01-28 NOTE — Patient Instructions (Addendum)
Medication Instructions:  Your physician has recommended you make the following change in your medication:  1.  STOP the Brilinta 2.  START Effient 10 mg taking 1  3.  INCREASE the Lopressor 25 mg taking 1 tablet twice a day  Labwork: None ordered  Testing/Procedures: None ordered  Follow-Up: Your physician recommends that you schedule a follow-up appointment in: 3 MONTHS WITH DR. [name]   Any Other Special Instructions Will Be Listed Below (If Applicable).    If you need a refill on your cardiac medications before your next appointment, please call your pharmacy.

## 2016-01-29 ENCOUNTER — Other Ambulatory Visit (INDEPENDENT_AMBULATORY_CARE_PROVIDER_SITE_OTHER): Payer: Medicare Other

## 2016-01-29 ENCOUNTER — Telehealth: Payer: Self-pay | Admitting: Internal Medicine

## 2016-01-29 ENCOUNTER — Ambulatory Visit (INDEPENDENT_AMBULATORY_CARE_PROVIDER_SITE_OTHER): Payer: Medicare Other | Admitting: Adult Health

## 2016-01-29 ENCOUNTER — Encounter: Payer: Self-pay | Admitting: Adult Health

## 2016-01-29 VITALS — BP 142/64 | HR 93 | Ht 68.0 in | Wt 285.6 lb

## 2016-01-29 DIAGNOSIS — R110 Nausea: Secondary | ICD-10-CM | POA: Diagnosis not present

## 2016-01-29 DIAGNOSIS — R0600 Dyspnea, unspecified: Secondary | ICD-10-CM | POA: Diagnosis not present

## 2016-01-29 DIAGNOSIS — G4733 Obstructive sleep apnea (adult) (pediatric): Secondary | ICD-10-CM | POA: Diagnosis not present

## 2016-01-29 DIAGNOSIS — R0602 Shortness of breath: Secondary | ICD-10-CM

## 2016-01-29 DIAGNOSIS — J40 Bronchitis, not specified as acute or chronic: Secondary | ICD-10-CM | POA: Diagnosis not present

## 2016-01-29 DIAGNOSIS — J398 Other specified diseases of upper respiratory tract: Secondary | ICD-10-CM

## 2016-01-29 DIAGNOSIS — R509 Fever, unspecified: Secondary | ICD-10-CM | POA: Diagnosis not present

## 2016-01-29 LAB — BASIC METABOLIC PANEL
BUN: 6 mg/dL (ref 6–23)
CO2: 34 mEq/L — ABNORMAL HIGH (ref 19–32)
Calcium: 8.1 mg/dL — ABNORMAL LOW (ref 8.4–10.5)
Chloride: 95 mEq/L — ABNORMAL LOW (ref 96–112)
Creatinine, Ser: 3.32 mg/dL — ABNORMAL HIGH (ref 0.40–1.50)
GFR: 21.03 mL/min — ABNORMAL LOW (ref >60.00)
Glucose, Bld: 124 mg/dL — ABNORMAL HIGH (ref 70–99)
Potassium: 3.3 mEq/L — ABNORMAL LOW (ref 3.5–5.1)
Sodium: 136 mEq/L (ref 135–145)

## 2016-01-29 LAB — CBC
HCT: 25.8 % — ABNORMAL LOW (ref 39.0–52.0)
Hemoglobin: 8.4 g/dL — ABNORMAL LOW (ref 13.0–17.0)
MCHC: 32.6 g/dL (ref 30.0–36.0)
MCV: 90.1 fl (ref 78.0–100.0)
Platelets: 372 10*3/uL (ref 150.0–400.0)
RBC: 2.86 Mil/uL — ABNORMAL LOW (ref 4.22–5.81)
RDW: 18.3 % — ABNORMAL HIGH (ref 11.5–15.5)
WBC: 5.1 10*3/uL (ref 4.0–10.5)

## 2016-01-29 NOTE — Telephone Encounter (Signed)
  Tammy  You are going to see Dale [name] today. I really think thiere is not much puml issues per se. CT shows pulm edema; they probably need to pull out more fluid at HD. Might be worth letting his renal team know  Thanks  Dr. Brand Males, M.D., [facility].C.P Pulmonary and Critical Care Medicine Staff Physician [place] Pager: [phone], If no answer or between  15:00h - 7:00h: call [phone]  01/29/2016 10:52 AM

## 2016-01-29 NOTE — Telephone Encounter (Signed)
Patient wife called states that he was to have labs - but they weren't aware they needed labs - patient is going to come early prior to appt to have this done - wants to know if we can put the order in as stat so we will have the results by the appt time - pr

## 2016-01-29 NOTE — Telephone Encounter (Signed)
lmtcb x1 

## 2016-01-29 NOTE — Telephone Encounter (Signed)
Patient's wife called back-was made aware that lab work was done today when he saw TP. Nothing more needed at this time.

## 2016-01-29 NOTE — Progress Notes (Signed)
Subjective:    Patient ID: Dale [name], male    DOB: 02-Oct-1965, 50 y.o.   MRN: [id number]  HPI 50 yo male with known DM and ESRD on Dialysis . Seen for pulmonary consult during hospitalization in 12/2015 for acute resp failure and  post op stridor found to have tracheomalacia on CT neck.   TEST  12/2015 >CT neck , tracheomalacia , highly atelectatic appearance of trachea.  01/29/16 HRCT Chest >very mild patchy GG attenuation, favored for mild interstitial edema. Severe tracheobronchomalacia.    01/29/2016 Follow up ; Dyspnea /CT chest results  Pt was seen earlier this week for post hospital follow up . He had near arrest after admission for hyperkalemia, probable GI bleed requiring intubation . He was extubated with post extubation stridor. CT neck showed tracheomalacia. He was treated with steroids and racemic epi. With improvement . He was found to have significant gastroparesis . Treated with reglan. He was recommended to see ENT in OP follow up . This admission was complicated by NSTEMI with stent placed .  He was referred  To GI but missed appointment . Still has lots of nausea.  Was suspected to have OSA w/ OHS and recommended to have OP sleep study . Carries dx of OSA but does not wear CPAP . Unable to find previous sleep study . We discussed set up for a OP sleep study .  Since discharge he says he remains very weak, no energy . Has severe nausea.  He was set up for a HRCT chest that showed very mild patchy GG attenuation, favored for mild interstitial edema. Severe tracheobronchomalacia.  He denies cough , hemoptysis or increased edema.     Past Medical History:  Diagnosis Date  . Anemia of chronic disease   . Arthritis    "hands, right knee" (03/19/2014)  . CAD (coronary artery disease)    a. Lexiscan Myoview (11/15):  Inf, apical cap and apical lateral ischemia, EF 56%, inf HK, Moderate Risk;  b. LHC (11/15):  mid to dist Dx 80%, mid RCA 99% (functional CTO), dist RCA 80% >>  PCI: balloon angioplasty to mid RCA (could not deliver stent) - plan staged PCI with HSRA of RCA; Dx to be tx medically >> PCI: Rotoblator atherectomy/DES to [facility]  . Cholecystitis    a. 08/27/2011  . Chronic diastolic CHF (congestive heart failure) ([place])    a. Echo 3/13:  EF 55-60%;  b. Echo (11/15):  Mild LVH, EF 60-65%, no RWMA, Gr 1 DD, MAC, mild LAE, normal RVF, mild RAE;  c.  Echo 5/16:  severe LVH, EF 55-60%, no RWMA, Gr 1 DD, MAC, mild LAE  . Claustrophobia    when things get around his face.   . Difficult intubation    only [facility] in [facility] haven't had a problem since then  . ESRD (end stage renal disease) ([place])    a. 1995 s/p cadaveric transplant w/ susbequent failure after 18 yrs;  b.Dialysis initiated 07/2011 [facility] M-W-F  . Fibromyalgia   . GERD (gastroesophageal reflux disease)   . Gout    PMH  . History of blood transfusion   . HLD (hyperlipidemia)   . Hypertension   . Hypothyroidism   . Inguinal lymphadenopathy    a. bilateral - s/p biopsy 07/2011  . Insulin dependent diabetes mellitus (HCC)    Type I  . OSA (obstructive sleep apnea)    adjustable bed  . Pericardial effusion  a.  Small by CT 08/22/11;  b.  Large by CT 08/27/11  . Pneumonia ~ 2007   Current Outpatient Prescriptions on File Prior to Visit  Medication Sig Dispense Refill  . acetaminophen (TYLENOL) 500 MG tablet Take 1,000 mg by mouth every 6 (six) hours as needed for moderate pain or headache.    . albuterol (PROVENTIL HFA;VENTOLIN HFA) 108 (90 Base) MCG/ACT inhaler Inhale 2 puffs into the lungs every 6 (six) hours as needed for wheezing or shortness of breath. 1 Inhaler 6  . allopurinol (ZYLOPRIM) 100 MG tablet Take 200 mg by mouth daily.     [name] Kitchen aspirin EC 81 MG tablet Take 81 mg by mouth at bedtime.    [name] Kitchen atorvastatin (LIPITOR) 80 MG tablet Take 1 tablet (80 mg total) by mouth daily at 6 PM. 30 tablet 0  . benzonatate (TESSALON) 100 MG capsule Take 1 capsule (100 mg total) by mouth 2 (two)  times daily as needed for cough. 60 capsule 0  . calcium acetate (PHOSLO) 667 MG capsule Take 1,334-2,001 mg by mouth See admin instructions. Take 3 capsules (2001 mg) by mouth with meals and 2 capsule (1334mg  ) with snacks    . diphenhydrAMINE (BENADRYL) 25 mg capsule Take 1-2 capsules (25-50 mg total) by mouth every 6 (six) hours as needed for itching. (Patient taking differently: Take 50 mg by mouth 2 (two) times daily as needed for itching or sleep. ) 30 capsule 0  . fenofibrate (TRICOR) 48 MG tablet Take 1 tablet (48 mg total) by mouth daily. 30 tablet 0  . Ferric Citrate (AURYXIA) 1 GM 210 MG(Fe) TABS Take 2 g by mouth 3 (three) times daily with meals.    . gabapentin (NEURONTIN) 300 MG capsule Take 300 mg by mouth 3 (three) times daily.    [name] Kitchen GLUCAGON EMERGENCY 1 MG injection Inject 1 mg into the muscle daily as needed (low blood sugar).     [name] Kitchen HYDROmorphone (DILAUDID) 2 MG tablet Take 1 tablet (2 mg total) by mouth 3 (three) times daily as needed for severe pain. (Patient taking differently: Take 2 mg by mouth 2 (two) times daily. Take 1 tablet (2 mg) by mouth daily at bedtime, may take an additional dose during the day as needed for severe pain) 20 tablet 0  . Insulin Human (INSULIN PUMP) 100 unit/ml SOLN Inject 1 each into the skin continuous. Humalog    . insulin lispro (HUMALOG) 100 UNIT/ML injection Inject 175 Units into the skin continuous. Up to 175 units continuous in pump,  Basal rates area as follows:  2400 1.5 units/hr, 0300 1.9 units/hr, 0800 1.45 units/hr, 1200 1.625 units/hr    . levothyroxine (SYNTHROID, LEVOTHROID) 100 MCG tablet Take 100 mcg by mouth at bedtime.     . metoCLOPramide (REGLAN) 5 MG tablet Take 2 tablets (10 mg total) by mouth 4 (four) times daily - after meals and at bedtime. 60 tablet 0  . metoprolol tartrate (LOPRESSOR) 25 MG tablet Take 1 tablet (25 mg total) by mouth 2 (two) times daily. 60 tablet 11  . midodrine (PROAMATINE) 10 MG tablet Take 20 mg by mouth  every Monday, Wednesday, and Friday. On dialysis days    . multivitamin (RENA-VIT) TABS tablet Take 1 tablet by mouth daily.    . nitroGLYCERIN (NITROSTAT) 0.4 MG SL tablet Place 0.4 mg under the tongue every 5 (five) minutes as needed for chest pain.     . Nutritional Supplements (FEEDING SUPPLEMENT, NEPRO CARB STEADY,) LIQD Take 237  mLs by mouth daily. 30 Can 0  . pantoprazole (PROTONIX) 40 MG tablet   1  . prasugrel (EFFIENT) 10 MG TABS tablet Take 1 tablet (10 mg total) by mouth daily. 90 tablet 3  . promethazine (PHENERGAN) 25 MG tablet Take 1 tablet (25 mg total) by mouth every 6 (six) hours as needed for nausea or vomiting. For nausea 30 tablet 0  . rOPINIRole (REQUIP) 2 MG tablet Take 2 mg by mouth daily with supper.      Current Facility-Administered Medications on File Prior to Visit  Medication Dose Route Frequency Provider Last Rate Last Dose  . 0.9 %  sodium chloride infusion   Intravenous Continuous Angelia Mould, MD      . Chlorhexidine Gluconate Cloth 2 % PADS 6 each  6 each Topical Once Angelia Mould, MD          Review of Systems Constitutional:   No  weight loss, night sweats,  Fevers, chills,  +fatigue, or  lassitude.  HEENT:   No headaches,  Difficulty swallowing,  Tooth/dental problems, or  Sore throat,                No sneezing, itching, ear ache, nasal congestion, post nasal drip,   CV:  No chest pain,  Orthopnea, PND, swelling in lower extremities, anasarca, dizziness, palpitations, syncope.   GI  ++nause heartburn, indigestion,   change in bowel habits, loss of appetite,  No bloody stools.   Resp:  No excess mucus, no productive cough,  No non-productive cough,  No coughing up of blood.  No change in color of mucus.  No wheezing.  No chest wall deformity  Skin: no rash or lesions.  GU: no dysuria, change in color of urine, no urgency or frequency.  No flank pain, no hematuria   MS:  No joint pain or swelling.  No decreased range of motion.   No back pain.  Psych:  No change in mood or affect. No depression or anxiety.  No memory loss.         Objective:   Physical Exam Vitals:   01/29/16 1354  BP: (!) 142/64  Pulse: 93  SpO2: 97%  Weight: 285 lb 9.6 oz (129.5 kg)  Height: 5\' 8"  (1.727 m)   GEN: A/Ox3; pleasant , NAD, chronically ill apearing   HEENT:  [place]/AT,  EACs-clear, TMs-wnl, NOSE-clear, THROAT-clear, no lesions, no postnasal drip or exudate noted. Class 2-3 MP airway   NECK:  Supple w/ fair ROM; no JVD; normal carotid impulses w/o bruits; no thyromegaly or nodules palpated; no lymphadenopathy.    RESP  Clear  P & A; w/o, wheezes/ rales/ or rhonchi. no accessory muscle use, no dullness to percussion  CARD:  RRR, no m/r/g  , tr  peripheral edema, pulses intact, no cyanosis or clubbing.  GI:   Soft & nt; nml bowel sounds; no organomegaly or masses detected.   Musco: Warm bil, no deformities or joint swelling noted.   Neuro: alert, no focal deficits noted.    Skin: Warm, no lesions or rashes  Adine Heimann NP-C  [facility]  01/29/2016        Assessment & Plan:

## 2016-01-29 NOTE — Patient Instructions (Signed)
We are setting you up for a split night sleep study .  We we get you rescheduled for GI Dale Holstein NP ) for gastroparesis .  Continue on current regimen  Follow up Dale [name] in 2-3 months and As needed

## 2016-02-01 DIAGNOSIS — E1040 Type 1 diabetes mellitus with diabetic neuropathy, unspecified: Secondary | ICD-10-CM | POA: Diagnosis not present

## 2016-02-01 DIAGNOSIS — E1059 Type 1 diabetes mellitus with other circulatory complications: Secondary | ICD-10-CM | POA: Diagnosis not present

## 2016-02-02 ENCOUNTER — Emergency Department (HOSPITAL_COMMUNITY): Payer: Medicare Other

## 2016-02-02 ENCOUNTER — Encounter (HOSPITAL_COMMUNITY): Payer: Self-pay | Admitting: Emergency Medicine

## 2016-02-02 ENCOUNTER — Inpatient Hospital Stay (HOSPITAL_COMMUNITY)
Admission: EM | Admit: 2016-02-02 | Discharge: 2016-02-06 | DRG: 291 | Disposition: A | Discharge status: Home / Self Care | Payer: Medicare Other | Attending: Internal Medicine | Admitting: Internal Medicine

## 2016-02-02 DIAGNOSIS — E662 Morbid (severe) obesity with alveolar hypoventilation: Secondary | ICD-10-CM | POA: Diagnosis not present

## 2016-02-02 DIAGNOSIS — I25110 Atherosclerotic heart disease of native coronary artery with unstable angina pectoris: Secondary | ICD-10-CM | POA: Diagnosis present

## 2016-02-02 DIAGNOSIS — R0602 Shortness of breath: Secondary | ICD-10-CM | POA: Diagnosis not present

## 2016-02-02 DIAGNOSIS — Z881 Allergy status to other antibiotic agents status: Secondary | ICD-10-CM

## 2016-02-02 DIAGNOSIS — N2581 Secondary hyperparathyroidism of renal origin: Secondary | ICD-10-CM | POA: Diagnosis present

## 2016-02-02 DIAGNOSIS — Z8249 Family history of ischemic heart disease and other diseases of the circulatory system: Secondary | ICD-10-CM

## 2016-02-02 DIAGNOSIS — N186 End stage renal disease: Secondary | ICD-10-CM

## 2016-02-02 DIAGNOSIS — I5033 Acute on chronic diastolic (congestive) heart failure: Secondary | ICD-10-CM | POA: Diagnosis not present

## 2016-02-02 DIAGNOSIS — I132 Hypertensive heart and chronic kidney disease with heart failure and with stage 5 chronic kidney disease, or end stage renal disease: Secondary | ICD-10-CM | POA: Diagnosis not present

## 2016-02-02 DIAGNOSIS — I252 Old myocardial infarction: Secondary | ICD-10-CM

## 2016-02-02 DIAGNOSIS — G8929 Other chronic pain: Secondary | ICD-10-CM | POA: Diagnosis present

## 2016-02-02 DIAGNOSIS — G894 Chronic pain syndrome: Secondary | ICD-10-CM

## 2016-02-02 DIAGNOSIS — R05 Cough: Secondary | ICD-10-CM | POA: Diagnosis not present

## 2016-02-02 DIAGNOSIS — R110 Nausea: Secondary | ICD-10-CM | POA: Diagnosis present

## 2016-02-02 DIAGNOSIS — D638 Anemia in other chronic diseases classified elsewhere: Secondary | ICD-10-CM | POA: Diagnosis present

## 2016-02-02 DIAGNOSIS — E118 Type 2 diabetes mellitus with unspecified complications: Secondary | ICD-10-CM

## 2016-02-02 DIAGNOSIS — I5043 Acute on chronic combined systolic (congestive) and diastolic (congestive) heart failure: Secondary | ICD-10-CM | POA: Diagnosis present

## 2016-02-02 DIAGNOSIS — I120 Hypertensive chronic kidney disease with stage 5 chronic kidney disease or end stage renal disease: Secondary | ICD-10-CM | POA: Diagnosis not present

## 2016-02-02 DIAGNOSIS — Z992 Dependence on renal dialysis: Secondary | ICD-10-CM | POA: Diagnosis not present

## 2016-02-02 DIAGNOSIS — R0789 Other chest pain: Secondary | ICD-10-CM | POA: Diagnosis not present

## 2016-02-02 DIAGNOSIS — Z886 Allergy status to analgesic agent status: Secondary | ICD-10-CM

## 2016-02-02 DIAGNOSIS — L03311 Cellulitis of abdominal wall: Secondary | ICD-10-CM | POA: Diagnosis present

## 2016-02-02 DIAGNOSIS — E1029 Type 1 diabetes mellitus with other diabetic kidney complication: Secondary | ICD-10-CM | POA: Diagnosis not present

## 2016-02-02 DIAGNOSIS — K219 Gastro-esophageal reflux disease without esophagitis: Secondary | ICD-10-CM | POA: Diagnosis present

## 2016-02-02 DIAGNOSIS — I5021 Acute systolic (congestive) heart failure: Secondary | ICD-10-CM | POA: Diagnosis not present

## 2016-02-02 DIAGNOSIS — E1022 Type 1 diabetes mellitus with diabetic chronic kidney disease: Secondary | ICD-10-CM | POA: Diagnosis not present

## 2016-02-02 DIAGNOSIS — Z961 Presence of intraocular lens: Secondary | ICD-10-CM | POA: Diagnosis present

## 2016-02-02 DIAGNOSIS — I313 Pericardial effusion (noninflammatory): Secondary | ICD-10-CM | POA: Diagnosis not present

## 2016-02-02 DIAGNOSIS — Z6841 Body Mass Index (BMI) 40.0 and over, adult: Secondary | ICD-10-CM

## 2016-02-02 DIAGNOSIS — R509 Fever, unspecified: Secondary | ICD-10-CM | POA: Diagnosis not present

## 2016-02-02 DIAGNOSIS — Z9841 Cataract extraction status, right eye: Secondary | ICD-10-CM

## 2016-02-02 DIAGNOSIS — E785 Hyperlipidemia, unspecified: Secondary | ICD-10-CM | POA: Diagnosis present

## 2016-02-02 DIAGNOSIS — E039 Hypothyroidism, unspecified: Secondary | ICD-10-CM | POA: Diagnosis present

## 2016-02-02 DIAGNOSIS — I959 Hypotension, unspecified: Secondary | ICD-10-CM | POA: Diagnosis not present

## 2016-02-02 DIAGNOSIS — E8889 Other specified metabolic disorders: Secondary | ICD-10-CM | POA: Diagnosis present

## 2016-02-02 DIAGNOSIS — Z955 Presence of coronary angioplasty implant and graft: Secondary | ICD-10-CM

## 2016-02-02 DIAGNOSIS — E119 Type 2 diabetes mellitus without complications: Secondary | ICD-10-CM

## 2016-02-02 DIAGNOSIS — I5020 Unspecified systolic (congestive) heart failure: Secondary | ICD-10-CM

## 2016-02-02 DIAGNOSIS — R079 Chest pain, unspecified: Secondary | ICD-10-CM | POA: Diagnosis present

## 2016-02-02 DIAGNOSIS — Z803 Family history of malignant neoplasm of breast: Secondary | ICD-10-CM

## 2016-02-02 DIAGNOSIS — Z889 Allergy status to unspecified drugs, medicaments and biological substances status: Secondary | ICD-10-CM

## 2016-02-02 DIAGNOSIS — I3139 Other pericardial effusion (noninflammatory): Secondary | ICD-10-CM | POA: Diagnosis present

## 2016-02-02 DIAGNOSIS — M797 Fibromyalgia: Secondary | ICD-10-CM | POA: Diagnosis present

## 2016-02-02 DIAGNOSIS — K3184 Gastroparesis: Secondary | ICD-10-CM | POA: Diagnosis present

## 2016-02-02 DIAGNOSIS — D631 Anemia in chronic kidney disease: Secondary | ICD-10-CM | POA: Diagnosis not present

## 2016-02-02 DIAGNOSIS — Z9641 Presence of insulin pump (external) (internal): Secondary | ICD-10-CM | POA: Diagnosis present

## 2016-02-02 DIAGNOSIS — Z89512 Acquired absence of left leg below knee: Secondary | ICD-10-CM

## 2016-02-02 DIAGNOSIS — R0902 Hypoxemia: Secondary | ICD-10-CM

## 2016-02-02 DIAGNOSIS — Z833 Family history of diabetes mellitus: Secondary | ICD-10-CM

## 2016-02-02 DIAGNOSIS — G4733 Obstructive sleep apnea (adult) (pediatric): Secondary | ICD-10-CM

## 2016-02-02 DIAGNOSIS — Z91041 Radiographic dye allergy status: Secondary | ICD-10-CM

## 2016-02-02 DIAGNOSIS — Z7982 Long term (current) use of aspirin: Secondary | ICD-10-CM

## 2016-02-02 DIAGNOSIS — M109 Gout, unspecified: Secondary | ICD-10-CM | POA: Diagnosis present

## 2016-02-02 DIAGNOSIS — I10 Essential (primary) hypertension: Secondary | ICD-10-CM | POA: Diagnosis not present

## 2016-02-02 DIAGNOSIS — Z7951 Long term (current) use of inhaled steroids: Secondary | ICD-10-CM

## 2016-02-02 DIAGNOSIS — Z885 Allergy status to narcotic agent status: Secondary | ICD-10-CM

## 2016-02-02 DIAGNOSIS — Z8371 Family history of colonic polyps: Secondary | ICD-10-CM

## 2016-02-02 DIAGNOSIS — Z794 Long term (current) use of insulin: Secondary | ICD-10-CM

## 2016-02-02 DIAGNOSIS — Z9842 Cataract extraction status, left eye: Secondary | ICD-10-CM

## 2016-02-02 DIAGNOSIS — R1900 Intra-abdominal and pelvic swelling, mass and lump, unspecified site: Secondary | ICD-10-CM

## 2016-02-02 HISTORY — DX: Gastroparesis: K31.84

## 2016-02-02 HISTORY — DX: Other specified diseases of upper respiratory tract: J39.8

## 2016-02-02 HISTORY — DX: Peripheral vascular disease, unspecified: I73.9

## 2016-02-02 HISTORY — DX: Morbid (severe) obesity due to excess calories: E66.01

## 2016-02-02 LAB — CBC WITH DIFFERENTIAL/PLATELET
Basophils Absolute: 0 10*3/uL (ref 0.0–0.1)
Basophils Relative: 1 %
Eosinophils Absolute: 0.4 10*3/uL (ref 0.0–0.7)
Eosinophils Relative: 7 %
HCT: 26.4 % — ABNORMAL LOW (ref 39.0–52.0)
Hemoglobin: 7.9 g/dL — ABNORMAL LOW (ref 13.0–17.0)
Lymphocytes Relative: 20 %
Lymphs Abs: 1.1 10*3/uL (ref 0.7–4.0)
MCH: 28.7 pg (ref 26.0–34.0)
MCHC: 29.9 g/dL — ABNORMAL LOW (ref 30.0–36.0)
MCV: 96 fL (ref 78.0–100.0)
Monocytes Absolute: 0.4 10*3/uL (ref 0.1–1.0)
Monocytes Relative: 7 %
Neutro Abs: 3.4 10*3/uL (ref 1.7–7.7)
Neutrophils Relative %: 65 %
Platelets: 375 10*3/uL (ref 150–400)
RBC: 2.75 MIL/uL — ABNORMAL LOW (ref 4.22–5.81)
RDW: 16.7 % — ABNORMAL HIGH (ref 11.5–15.5)
WBC: 5.3 10*3/uL (ref 4.0–10.5)

## 2016-02-02 LAB — GLUCOSE, CAPILLARY
Glucose-Capillary: 202 mg/dL — ABNORMAL HIGH (ref 65–99)
Glucose-Capillary: 281 mg/dL — ABNORMAL HIGH (ref 65–99)

## 2016-02-02 LAB — BASIC METABOLIC PANEL
Anion gap: 10 (ref 5–15)
BUN: 14 mg/dL (ref 6–20)
CO2: 30 mmol/L (ref 22–32)
Calcium: 8.4 mg/dL — ABNORMAL LOW (ref 8.9–10.3)
Chloride: 94 mmol/L — ABNORMAL LOW (ref 101–111)
Creatinine, Ser: 5.98 mg/dL — ABNORMAL HIGH (ref 0.61–1.24)
GFR calc Af Amer: 11 mL/min — ABNORMAL LOW (ref >60)
GFR calc non Af Amer: 10 mL/min — ABNORMAL LOW (ref >60)
Glucose, Bld: 132 mg/dL — ABNORMAL HIGH (ref 65–99)
Potassium: 3.5 mmol/L (ref 3.5–5.1)
Sodium: 134 mmol/L — ABNORMAL LOW (ref 135–145)

## 2016-02-02 LAB — PROCALCITONIN: Procalcitonin: 0.68 ng/mL

## 2016-02-02 LAB — BRAIN NATRIURETIC PEPTIDE: B Natriuretic Peptide: 1185.6 pg/mL — ABNORMAL HIGH (ref 0.0–100.0)

## 2016-02-02 LAB — I-STAT TROPONIN, ED
Troponin i, poc: 0.01 ng/mL (ref 0.00–0.08)
Troponin i, poc: 0.02 ng/mL (ref 0.00–0.08)

## 2016-02-02 LAB — CBG MONITORING, ED: Glucose-Capillary: 142 mg/dL — ABNORMAL HIGH (ref 65–99)

## 2016-02-02 LAB — TSH: TSH: 4.84 u[IU]/mL — ABNORMAL HIGH (ref 0.350–4.500)

## 2016-02-02 LAB — TROPONIN I: Troponin I: 0.04 ng/mL (ref <0.03)

## 2016-02-02 MED ORDER — HYDROMORPHONE HCL 2 MG PO TABS
2.0000 mg | ORAL_TABLET | Freq: Two times a day (BID) | ORAL | Status: DC
Start: 2016-02-02 — End: 2016-02-02

## 2016-02-02 MED ORDER — ROPINIROLE HCL 1 MG PO TABS
2.0000 mg | ORAL_TABLET | Freq: Every day | ORAL | Status: DC
  Administered 2016-02-02 – 2016-02-06 (×4): 2 mg via ORAL
  Filled 2016-02-02 (×5): qty 2

## 2016-02-02 MED ORDER — GABAPENTIN 300 MG PO CAPS
300.0000 mg | ORAL_CAPSULE | Freq: Three times a day (TID) | ORAL | Status: DC
  Administered 2016-02-02 – 2016-02-06 (×11): 300 mg via ORAL
  Filled 2016-02-02 (×11): qty 1

## 2016-02-02 MED ORDER — CALCIUM ACETATE (PHOS BINDER) 667 MG PO CAPS: 1334.0000 mg | ORAL_CAPSULE | ORAL | Status: DC | PRN

## 2016-02-02 MED ORDER — ALBUTEROL SULFATE (2.5 MG/3ML) 0.083% IN NEBU: 2.5000 mg | INHALATION_SOLUTION | RESPIRATORY_TRACT | Status: DC | PRN

## 2016-02-02 MED ORDER — ONDANSETRON HCL 4 MG/2ML IJ SOLN: 4.0000 mg | Freq: Four times a day (QID) | INTRAMUSCULAR | Status: DC | PRN

## 2016-02-02 MED ORDER — INSULIN GLARGINE 100 UNIT/ML ~~LOC~~ SOLN
7.0000 [IU] | Freq: Every day | SUBCUTANEOUS | Status: DC
  Administered 2016-02-03 – 2016-02-05 (×4): 7 [IU] via SUBCUTANEOUS
  Filled 2016-02-02 (×5): qty 0.07

## 2016-02-02 MED ORDER — METOCLOPRAMIDE HCL 5 MG PO TABS
5.0000 mg | ORAL_TABLET | Freq: Three times a day (TID) | ORAL | Status: DC
  Administered 2016-02-02 – 2016-02-06 (×12): 5 mg via ORAL
  Filled 2016-02-02 (×12): qty 1

## 2016-02-02 MED ORDER — ALLOPURINOL 100 MG PO TABS
200.0000 mg | ORAL_TABLET | Freq: Every day | ORAL | Status: DC
  Administered 2016-02-02 – 2016-02-06 (×5): 200 mg via ORAL
  Filled 2016-02-02 (×5): qty 2

## 2016-02-02 MED ORDER — HYDROMORPHONE HCL 1 MG/ML IJ SOLN
1.0000 mg | Freq: Once | INTRAMUSCULAR | Status: AC
  Administered 2016-02-02: 1 mg via INTRAVENOUS
  Filled 2016-02-02: qty 1

## 2016-02-02 MED ORDER — ASPIRIN EC 81 MG PO TBEC
81.0000 mg | DELAYED_RELEASE_TABLET | Freq: Every day | ORAL | Status: DC
  Administered 2016-02-03 – 2016-02-05 (×3): 81 mg via ORAL
  Filled 2016-02-02 (×3): qty 1

## 2016-02-02 MED ORDER — ALBUTEROL SULFATE (2.5 MG/3ML) 0.083% IN NEBU
2.5000 mg | INHALATION_SOLUTION | Freq: Four times a day (QID) | RESPIRATORY_TRACT | Status: DC | PRN
  Administered 2016-02-02: 2.5 mg via RESPIRATORY_TRACT
  Filled 2016-02-02: qty 3

## 2016-02-02 MED ORDER — GI COCKTAIL ~~LOC~~
30.0000 mL | Freq: Four times a day (QID) | ORAL | Status: DC | PRN
  Administered 2016-02-03: 30 mL via ORAL
  Filled 2016-02-02 (×2): qty 30

## 2016-02-02 MED ORDER — HYDROMORPHONE HCL 1 MG/ML IJ SOLN
0.5000 mg | INTRAMUSCULAR | Status: DC | PRN
  Administered 2016-02-02 – 2016-02-06 (×19): 0.5 mg via INTRAVENOUS
  Filled 2016-02-02 (×16): qty 1

## 2016-02-02 MED ORDER — ENOXAPARIN SODIUM 40 MG/0.4ML ~~LOC~~ SOLN
40.0000 mg | SUBCUTANEOUS | Status: DC
  Administered 2016-02-02 – 2016-02-05 (×4): 40 mg via SUBCUTANEOUS
  Filled 2016-02-02 (×4): qty 0.4

## 2016-02-02 MED ORDER — INSULIN PUMP: 1.0000 | SUBCUTANEOUS | Status: DC

## 2016-02-02 MED ORDER — ONDANSETRON HCL 4 MG/2ML IJ SOLN
4.0000 mg | INTRAMUSCULAR | Status: AC
  Administered 2016-02-02 (×2): 4 mg via INTRAVENOUS
  Filled 2016-02-02 (×2): qty 2

## 2016-02-02 MED ORDER — FUROSEMIDE 10 MG/ML IJ SOLN: 80.0000 mg | Freq: Once | INTRAMUSCULAR | Status: DC

## 2016-02-02 MED ORDER — INSULIN ASPART 100 UNIT/ML ~~LOC~~ SOLN
0.0000 [IU] | Freq: Every day | SUBCUTANEOUS | Status: DC
  Administered 2016-02-02: 3 [IU] via SUBCUTANEOUS
  Administered 2016-02-03: 2 [IU] via SUBCUTANEOUS
  Administered 2016-02-05: 3 [IU] via SUBCUTANEOUS

## 2016-02-02 MED ORDER — LEVOTHYROXINE SODIUM 100 MCG PO TABS
100.0000 ug | ORAL_TABLET | Freq: Every day | ORAL | Status: DC
  Administered 2016-02-02 – 2016-02-05 (×4): 100 ug via ORAL
  Filled 2016-02-02 (×4): qty 1

## 2016-02-02 MED ORDER — MIDODRINE HCL 5 MG PO TABS
20.0000 mg | ORAL_TABLET | ORAL | Status: DC
  Administered 2016-02-03 – 2016-02-05 (×2): 20 mg via ORAL
  Filled 2016-02-02 (×3): qty 4

## 2016-02-02 MED ORDER — FENOFIBRATE 54 MG PO TABS
54.0000 mg | ORAL_TABLET | Freq: Every day | ORAL | Status: DC
  Administered 2016-02-03 – 2016-02-06 (×4): 54 mg via ORAL
  Filled 2016-02-02 (×5): qty 1

## 2016-02-02 MED ORDER — INSULIN LISPRO 100 UNIT/ML ~~LOC~~ SOLN: 1.0000 [IU] | SUBCUTANEOUS | Status: DC

## 2016-02-02 MED ORDER — ASPIRIN EC 81 MG PO TBEC: 81.0000 mg | DELAYED_RELEASE_TABLET | Freq: Every day | ORAL | Status: DC

## 2016-02-02 MED ORDER — ATORVASTATIN CALCIUM 80 MG PO TABS
80.0000 mg | ORAL_TABLET | Freq: Every day | ORAL | Status: DC
  Administered 2016-02-02 – 2016-02-05 (×4): 80 mg via ORAL
  Filled 2016-02-02 (×4): qty 1

## 2016-02-02 MED ORDER — METOCLOPRAMIDE HCL 10 MG PO TABS
10.0000 mg | ORAL_TABLET | Freq: Three times a day (TID) | ORAL | Status: DC
  Administered 2016-02-02: 10 mg via ORAL
  Filled 2016-02-02: qty 1

## 2016-02-02 MED ORDER — NITROGLYCERIN 0.4 MG SL SUBL
0.4000 mg | SUBLINGUAL_TABLET | SUBLINGUAL | Status: DC | PRN
Start: 2016-02-02 — End: 2016-02-06
  Administered 2016-02-02: 0.4 mg via SUBLINGUAL
  Filled 2016-02-02: qty 1

## 2016-02-02 MED ORDER — METOPROLOL TARTRATE 25 MG PO TABS
25.0000 mg | ORAL_TABLET | Freq: Two times a day (BID) | ORAL | Status: DC
  Administered 2016-02-02 – 2016-02-06 (×7): 25 mg via ORAL
  Filled 2016-02-02 (×9): qty 1

## 2016-02-02 MED ORDER — ALBUTEROL SULFATE HFA 108 (90 BASE) MCG/ACT IN AERS: 2.0000 | INHALATION_SPRAY | Freq: Four times a day (QID) | RESPIRATORY_TRACT | Status: DC | PRN

## 2016-02-02 MED ORDER — BENZONATATE 100 MG PO CAPS
100.0000 mg | ORAL_CAPSULE | Freq: Two times a day (BID) | ORAL | Status: DC | PRN
  Administered 2016-02-02: 100 mg via ORAL
  Filled 2016-02-02: qty 1

## 2016-02-02 MED ORDER — CALCIUM ACETATE (PHOS BINDER) 667 MG PO CAPS
2001.0000 mg | ORAL_CAPSULE | Freq: Three times a day (TID) | ORAL | Status: DC
  Administered 2016-02-02 – 2016-02-06 (×8): 2001 mg via ORAL
  Filled 2016-02-02 (×8): qty 3

## 2016-02-02 MED ORDER — FERRIC CITRATE 1 GM 210 MG(FE) PO TABS: 2.0000 g | ORAL_TABLET | Freq: Three times a day (TID) | ORAL | Status: DC

## 2016-02-02 MED ORDER — INSULIN ASPART 100 UNIT/ML ~~LOC~~ SOLN
0.0000 [IU] | Freq: Three times a day (TID) | SUBCUTANEOUS | Status: DC
  Administered 2016-02-02: 3 [IU] via SUBCUTANEOUS
  Administered 2016-02-03 – 2016-02-04 (×2): 1 [IU] via SUBCUTANEOUS
  Administered 2016-02-04: 2 [IU] via SUBCUTANEOUS
  Administered 2016-02-05: 3 [IU] via SUBCUTANEOUS
  Administered 2016-02-06 (×2): 9 [IU] via SUBCUTANEOUS

## 2016-02-02 MED ORDER — DIPHENHYDRAMINE HCL 25 MG PO CAPS: 50.0000 mg | ORAL_CAPSULE | Freq: Two times a day (BID) | ORAL | Status: DC | PRN

## 2016-02-02 MED ORDER — PROMETHAZINE HCL 25 MG PO TABS
25.0000 mg | ORAL_TABLET | Freq: Four times a day (QID) | ORAL | Status: DC | PRN
  Administered 2016-02-02 – 2016-02-04 (×5): 25 mg via ORAL
  Filled 2016-02-02 (×5): qty 1

## 2016-02-02 MED ORDER — ACETAMINOPHEN 325 MG PO TABS
650.0000 mg | ORAL_TABLET | ORAL | Status: DC | PRN
  Administered 2016-02-05 (×2): 650 mg via ORAL
  Filled 2016-02-02: qty 2

## 2016-02-02 MED ORDER — RENA-VITE PO TABS
1.0000 | ORAL_TABLET | Freq: Every day | ORAL | Status: DC
  Administered 2016-02-02 – 2016-02-05 (×4): 1 via ORAL
  Filled 2016-02-02 (×4): qty 1

## 2016-02-02 MED ORDER — RENA-VITE PO TABS: 1.0000 | ORAL_TABLET | Freq: Every day | ORAL | Status: DC

## 2016-02-02 MED ORDER — PANTOPRAZOLE SODIUM 40 MG PO TBEC
40.0000 mg | DELAYED_RELEASE_TABLET | Freq: Every day | ORAL | Status: DC
  Administered 2016-02-02 – 2016-02-05 (×4): 40 mg via ORAL
  Filled 2016-02-02 (×4): qty 1

## 2016-02-02 MED ORDER — GI COCKTAIL ~~LOC~~
30.0000 mL | Freq: Once | ORAL | Status: AC
  Administered 2016-02-02: 30 mL via ORAL
  Filled 2016-02-02: qty 30

## 2016-02-02 MED ORDER — ALBUTEROL SULFATE (2.5 MG/3ML) 0.083% IN NEBU
2.5000 mg | INHALATION_SOLUTION | Freq: Three times a day (TID) | RESPIRATORY_TRACT | Status: DC
Start: 2016-02-02 — End: 2016-02-04
  Administered 2016-02-02 – 2016-02-04 (×4): 2.5 mg via RESPIRATORY_TRACT
  Filled 2016-02-02 (×5): qty 3

## 2016-02-02 NOTE — ED Notes (Signed)
Returned to the room. [place] 2L placed on

## 2016-02-02 NOTE — ED Notes (Signed)
Went to [facility]

## 2016-02-02 NOTE — Progress Notes (Signed)
[facility] Brief Note  50 yo WM with IDDM, ESRD (MWF HD), OSA, chronic dCHF, CAD with prior NSTEMI/stents.   In under OBS status with N/V, left anterior chest pressure. Has regularly attended MWF HD sessions, and has been leaving under his EDW fairly consistently over the past couple of weeks, though no recent actual lowering of EDW.  Labs OK (K 3.5), CXR with vascular congestion but no interstitial edema, and with small bilateral effusions.  Cards has seen and not felt to have ACS.   Usual HD Rx: MWF 4.25 hours 350/800 2K 2.25 Ca P4 EDW 126 kg  (will need lower EDW) Hectorol 3 mcg Venofer 50/week Mircera 225 Q2weeks (last dosed 01/06/16)  Plan for regular HD tomorrow, with goal to lower EDW. 4K bath  [name], MD [facility] [phone] Pager 02/02/2016, 4:06 PM

## 2016-02-02 NOTE — Consult Note (Signed)
[facility] Renal Consultation Note    Indication for Consultation:  Management of ESRD/hemodialysis, anemia, hypertension/volume, and secondary hyperparathyroidism. PCP: Dr. [name]  HPI: Dale [name] is a 50 y.o. male with ESRD on HD, CAD (s/p remote DES to [facility] 01/2014 and recent NSTEMI/DES to diagonal 01/01/16), Type 1 DM, gastroparesis, hypothyroidism, sleep apnea, CHF, morbid obesity, GERD who was admitted with chest pain and dyspnea.   Since prior discharge on 9/28, he has had intermittent nausea and vomiting. Has also noticed low-grade fevers every afternoon (max T101), and he has noticed worsened edema which has led to dyspnea. + occ cough. No runny nose, sinus pain, headache, diarrhea.  He is dialyzed MWF at [facility]. Last dialysis was 10/9. Reports that he did have dialysis circuit clot yesterday which is new for him, but otherwise no HD issues.   Past Medical History:  Diagnosis Date  . Anemia of chronic disease   . Arthritis    "hands, right knee" (03/19/2014)  . CAD (coronary artery disease)    a. Abnl nuc ->LHC (11/15):  mid to dist Dx 80%, mid RCA 99% (functional CTO), dist RCA 80% >> PCI: balloon angioplasty to mid RCA (could not deliver stent) - staged PCI Rotoblator atherectomy/DES to [facility]. b. NSTEMI 12/2015 s/p DES to diagonal.  . Cholecystitis    a. 08/27/2011  . Chronic diastolic CHF (congestive heart failure) ([place])    a. Echo 3/13:  EF 55-60%;  b. Echo (11/15):  Mild LVH, EF 60-65%, no RWMA, Gr 1 DD, MAC, mild LAE, normal RVF, mild RAE;  c.  Echo 5/16:  severe LVH, EF 55-60%, no RWMA, Gr 1 DD, MAC, mild LAE  . Claustrophobia    when things get around his face.   . Difficult intubation    only once in 2015 at [facility] haven't had a problem since then  . ESRD (end stage renal disease) ([place])    a. 1995 s/p cadaveric transplant w/ susbequent failure after 18 yrs;  b.Dialysis initiated 07/2011 [facility] M-W-F  . Fibromyalgia   .  Gastroparesis   . GERD (gastroesophageal reflux disease)   . Gout    PMH  . History of blood transfusion   . HLD (hyperlipidemia)   . Hypertension   . Hypothyroidism   . Inguinal lymphadenopathy    a. bilateral - s/p biopsy 07/2011  . Insulin dependent diabetes mellitus (HCC)    Type I  . Morbid obesity ([place])   . OSA (obstructive sleep apnea)    adjustable bed  . PAD (peripheral artery disease) (HCC)    a. s/p L BKA.  [name] Kitchen Pericardial effusion    a.  Small by CT 08/22/11;  b.  Large by CT 08/27/11. c. Not seen on 11/2015 echo.  . Pneumonia ~ 2007  . Tracheobronchomalacia    Past Surgical History:  Procedure Laterality Date  . AMPUTATION OF REPLICATED TOES Right   . ANGIOPLASTY  04/09/2014   RCA   . APPENDECTOMY  ~ 2006  . ARTERIOVENOUS GRAFT PLACEMENT Left 1993?   forearm  . ARTERIOVENOUS GRAFT PLACEMENT Right 1993?   leg  . ARTERIOVENOUS GRAFT PLACEMENT Left    Thigh  . Arteriovenous Graft Removed Left    Thigh  . AV FISTULA PLACEMENT  07/29/2011   Procedure: ARTERIOVENOUS (AV) FISTULA CREATION;  Surgeon: Mal Misty, MD;  Location: [place];  Service: Vascular;  Laterality: Right;  Brachial cephalic  . AV FISTULA PLACEMENT Left 01/20/2015  Procedure: LIGATION OF RIGHT ARTERIOVENOUS FISTULA WITH EXCISION OF ANEURYSM;  Surgeon: Angelia Mould, MD;  Location: [place];  Service: Vascular;  Laterality: Left;  . AV FISTULA PLACEMENT Left 04/30/2015   Procedure: CREATION OF LEFT ARM BRACHIO-CEPHALIC ARTERIOVENOUS (AV) FISTULA ;  Surgeon: Angelia Mould, MD;  Location: [place];  Service: Vascular;  Laterality: Left;  . AV Graft PLACEMENT Left 1993?   "attempted one in my wrist; didn't take"  . BELOW KNEE LEG AMPUTATION Left 2010  . CARDIAC CATHETERIZATION  03/19/2014  . CARDIAC CATHETERIZATION  03/19/2014   Procedure: CORONARY BALLOON ANGIOPLASTY;  Surgeon: Burnell Blanks, MD;  Location: [facility] CATH LAB;  Service: Cardiovascular;;  . CARDIAC CATHETERIZATION N/A 01/01/2016    Procedure: Left Heart Cath and Coronary Angiography;  Surgeon: Troy Sine, MD;  Location: [place] CV LAB;  Service: Cardiovascular;  Laterality: N/A;  . CARDIAC CATHETERIZATION N/A 01/01/2016   Procedure: Coronary Stent Intervention;  Surgeon: Troy Sine, MD;  Location: [place] CV LAB;  Service: Cardiovascular;  Laterality: N/A;  . CARPAL TUNNEL RELEASE Bilateral after 2005  . CATARACT EXTRACTION W/ INTRAOCULAR LENS  IMPLANT, BILATERAL Bilateral 1990's  . COLONOSCOPY  08/29/2011   Procedure: COLONOSCOPY;  Surgeon: Jerene Bears, MD;  Location: [place];  Service: Gastroenterology;  Laterality: N/A;  . CORONARY ANGIOPLASTY WITH STENT PLACEMENT  04/09/2014       ptca/des mid lad   . ESOPHAGOGASTRODUODENOSCOPY N/A 12/25/2015   Procedure: ESOPHAGOGASTRODUODENOSCOPY (EGD);  Surgeon: Mauri Pole, MD;  Location: [facility] ENDOSCOPY;  Service: Endoscopy;  Laterality: N/A;  bedside  . EYE SURGERY Bilateral    cataract  . FEMORAL ARTERY REPAIR  04/09/2014   ANGIOSEAL  . INSERTION OF DIALYSIS CATHETER  08/01/2011   Procedure: INSERTION OF DIALYSIS CATHETER;  Surgeon: Rosetta Posner, MD;  Location: [place];  Service: Vascular;  Laterality: Right;  insertion of dialysis catheter on right internal jugular vein  . INSERTION OF DIALYSIS CATHETER Right 01/20/2015   Procedure: INSERTION OF DIALYSIS CATHETER - RIGHT INTERNAL JUGULAR ;  Surgeon: Angelia Mould, MD;  Location: [place];  Service: Vascular;  Laterality: Right;  . KIDNEY TRANSPLANT  04/25/1993  . LEFT HEART CATHETERIZATION WITH CORONARY ANGIOGRAM N/A 03/19/2014   Procedure: LEFT HEART CATHETERIZATION WITH CORONARY ANGIOGRAM;  Surgeon: Burnell Blanks, MD;  Location: [facility] CATH LAB;  Service: Cardiovascular;  Laterality: N/A;  . LYMPH NODE BIOPSY  08/10/2011   Procedure: LYMPH NODE BIOPSY;  Surgeon: Merrie Roof, MD;  Location: [place];  Service: General;  Laterality: Left;  left groin lymph biopsy  . NEUROPLASTY / TRANSPOSITION ULNAR  NERVE AT ELBOW Left after 2005  . PERCUTANEOUS CORONARY ROTOBLATOR INTERVENTION (PCI-R) N/A 04/09/2014   Procedure: PERCUTANEOUS CORONARY ROTOBLATOR INTERVENTION (PCI-R);  Surgeon: Burnell Blanks, MD;  Location: [facility] CATH LAB;  Service: Cardiovascular;  Laterality: N/A;  . PERIPHERAL VASCULAR CATHETERIZATION N/A 12/25/2014   Procedure: Upper Extremity Venography;  Surgeon: Conrad , MD;  Location: [place] CV LAB;  Service: Cardiovascular;  Laterality: N/A;  . PERITONEAL CATHETER INSERTION    . PERITONEAL CATHETER REMOVAL    . REVISON OF ARTERIOVENOUS FISTULA Right 06/20/3333   Procedure: PLICATION / REVISION OF ARTERIOVENOUS FISTULA;  Surgeon: Angelia Mould, MD;  Location: [place];  Service: Vascular;  Laterality: Right;  . REVISON OF ARTERIOVENOUS FISTULA Left 09/01/2015   Procedure: SUPERFICIALIZATION OF LEFT ARM BRACHIOCEPHALIC ARTERIOVENOUS FISTULA;  Surgeon: Angelia Mould, MD;  Location: [place];  Service: Vascular;  Laterality: Left;  . SHOULDER ARTHROSCOPY Left ~ 2006   frozen  . TOE AMPUTATION Bilateral after 2005  . UMBILICAL HERNIA REPAIR  1990's  . VENOGRAM Right 07/28/2011   Procedure: VENOGRAM;  Surgeon: Conrad [place], MD;  Location: [facility] CATH LAB;  Service: Cardiovascular;  Laterality: Right;  . VITRECTOMY Bilateral    Family History  Problem Relation Age of Onset  . Colon polyps Father   . Diabetes Father   . Hypertension Father   . Hypertension Mother   . Diabetes Mother   . Hyperlipidemia Mother   . Breast cancer Maternal Aunt   . Cancer Maternal Aunt     Breast and Bone  . Malignant hyperthermia Neg Hx   . Heart attack Neg Hx   . Stroke Neg Hx    Social History:  reports that he has never smoked. He has never used smokeless tobacco. He reports that he drinks alcohol. He reports that he does not use drugs. Allergies  Allergen Reactions  . Contrast Media [Iodinated Diagnostic Agents] Rash and Itching    Systemic itching and transient rash  appearance within hours of receiving contrast  Systemic itching and transient rash appearance within hours of receiving contrast  [place] Syndrome  . Rocephin [Ceftriaxone] Itching  . Amoxicillin Itching and Rash    .Has patient had a PCN reaction causing immediate rash, facial/tongue/throat swelling, SOB or lightheadedness with hypotension: Yes Has patient had a PCN reaction causing severe rash involving mucus membranes or skin necrosis: No Has patient had a PCN reaction that required hospitalization No Has patient had a PCN reaction occurring within the last 10 years: No If all of the above answers are "NO", then may proceed with Cephalosporin use.  . Buprenorphine Hcl Nausea Only and Hives  . Clopidogrel Rash  . Hydrocodone Rash  . Morphine Nausea Only, Rash and Hives  . Adhesive [Tape] Other (See Comments) and Itching    Blisters on arm from adhesive tape at dialysis Blisters on arm from adhesive tape at dialysis  . Prednisone Other (See Comments)    Increases sugar levels  . Ciprofloxacin Itching and Rash  . Clindamycin/Lincomycin Hives and Itching  . Codeine Hives, Itching and Rash  . Doxycycline Rash  . Enalapril Rash    cough  . Levofloxacin Hives and Itching  . Mobic [Meloxicam] Hives and Rash  . Oxycodone Rash  . Vasotec Cough   Prior to Admission medications   Medication Sig Start Date End Date Taking? Authorizing Provider  albuterol (PROVENTIL HFA;VENTOLIN HFA) 108 (90 Base) MCG/ACT inhaler Inhale 2 puffs into the lungs every 6 (six) hours as needed for wheezing or shortness of breath. 01/26/16  Yes Brand Males, MD  allopurinol (ZYLOPRIM) 100 MG tablet Take 200 mg by mouth daily.  03/14/12  Yes Historical Provider, MD  aspirin EC 81 MG tablet Take 81 mg by mouth at bedtime.   Yes Historical Provider, MD  atorvastatin (LIPITOR) 80 MG tablet Take 1 tablet (80 mg total) by mouth daily at 6 PM. 01/04/16  Yes Florencia Reasons, MD  benzonatate (TESSALON) 100 MG capsule Take 1  capsule (100 mg total) by mouth 2 (two) times daily as needed for cough. 01/26/16  Yes Brand Males, MD  calcium acetate (PHOSLO) 667 MG capsule Take 1,334-2,001 mg by mouth See admin instructions. Take 3 capsules (2001 mg) by mouth with meals and 2 capsule (1334mg  ) with snacks 09/19/12  Yes Historical Provider, MD  diphenhydrAMINE (BENADRYL) 25 mg capsule  Take 1-2 capsules (25-50 mg total) by mouth every 6 (six) hours as needed for itching. Patient taking differently: Take 50 mg by mouth 2 (two) times daily as needed for itching or sleep.  04/11/14  Yes Rhonda G Barrett, PA-C  fenofibrate (TRICOR) 48 MG tablet Take 1 tablet (48 mg total) by mouth daily. 02/27/15  Yes Shanker Kristeen Mans, MD  Ferric Citrate (AURYXIA) 1 GM 210 MG(Fe) TABS Take 2 g by mouth 3 (three) times daily with meals.   Yes Historical Provider, MD  fluticasone (FLONASE) 50 MCG/ACT nasal spray Place 1 spray into both nostrils daily.   Yes Historical Provider, MD  gabapentin (NEURONTIN) 300 MG capsule Take 300 mg by mouth 3 (three) times daily.   Yes Historical Provider, MD  HYDROmorphone (DILAUDID) 2 MG tablet Take 1 tablet (2 mg total) by mouth 3 (three) times daily as needed for severe pain. Patient taking differently: Take 2 mg by mouth 2 (two) times daily. Take 1 tablet (2 mg) by mouth daily at bedtime, may take an additional dose during the day as needed for severe pain 11/20/14  Yes Alvia Grove, PA-C  Insulin Human (INSULIN PUMP) 100 unit/ml SOLN Inject 1 each into the skin continuous. Humalog   Yes Historical Provider, MD  insulin lispro (HUMALOG) 100 UNIT/ML injection Inject 1-175 Units into the skin continuous. Up to 175 units continuous in pump,  Basal rates area as follows:  2400 1.5 units/hr, 0300 1.9 units/hr, 0800 1.45 units/hr, 1200 1.625 units/hr   Yes Historical Provider, MD  levothyroxine (SYNTHROID, LEVOTHROID) 100 MCG tablet Take 100 mcg by mouth at bedtime.    Yes Historical Provider, MD  metoCLOPramide  (REGLAN) 5 MG tablet Take 2 tablets (10 mg total) by mouth 4 (four) times daily - after meals and at bedtime. 01/21/16  Yes Velvet Bathe, MD  metoprolol tartrate (LOPRESSOR) 25 MG tablet Take 1 tablet (25 mg total) by mouth 2 (two) times daily. 01/28/16  Yes Eileen Stanford, PA-C  midodrine (PROAMATINE) 10 MG tablet Take 20 mg by mouth every Monday, Wednesday, and Friday. On dialysis days   Yes Historical Provider, MD  multivitamin (RENA-VIT) TABS tablet Take 1 tablet by mouth daily.   Yes Historical Provider, MD  Nutritional Supplements (FEEDING SUPPLEMENT, NEPRO CARB STEADY,) LIQD Take 237 mLs by mouth daily. 01/04/16  Yes Florencia Reasons, MD  pantoprazole (PROTONIX) 40 MG tablet Take 40 mg by mouth daily.  01/14/16  Yes Historical Provider, MD  Polyethyl Glycol-Propyl Glycol (SYSTANE) 0.4-0.3 % SOLN Place 1 drop into both eyes as needed (dry eyes).   Yes Historical Provider, MD  prasugrel (EFFIENT) 10 MG TABS tablet Take 1 tablet (10 mg total) by mouth daily. 01/28/16 04/27/16 Yes Eileen Stanford, PA-C  promethazine (PHENERGAN) 25 MG tablet Take 1 tablet (25 mg total) by mouth every 6 (six) hours as needed for nausea or vomiting. For nausea 11/27/14  Yes Liliane Shi, PA-C  rOPINIRole (REQUIP) 2 MG tablet Take 2 mg by mouth daily with supper.    Yes Historical Provider, MD  GLUCAGON EMERGENCY 1 MG injection Inject 1 mg into the muscle daily as needed (low blood sugar).  01/19/12   Historical Provider, MD  nitroGLYCERIN (NITROSTAT) 0.4 MG SL tablet Place 0.4 mg under the tongue every 5 (five) minutes as needed for chest pain.     [name], MD   Current Facility-Administered Medications  Medication Dose Route Frequency Provider Last Rate Last Dose  . acetaminophen (TYLENOL) tablet  650 mg  650 mg Oral Q4H PRN Radene Gunning, NP      . albuterol (PROVENTIL) (2.5 MG/3ML) 0.083% nebulizer solution 2.5 mg  2.5 mg Nebulization Q6H PRN Radene Gunning, NP      . allopurinol (ZYLOPRIM) tablet 200 mg  200 mg  Oral Daily Radene Gunning, NP      . Derrill Memo ON 02/03/2016] aspirin EC tablet 81 mg  81 mg Oral QHS Radene Gunning, NP      . atorvastatin (LIPITOR) tablet 80 mg  80 mg Oral q1800 Lezlie Octave Black, NP      . benzonatate (TESSALON) capsule 100 mg  100 mg Oral BID PRN Radene Gunning, NP      . calcium acetate (PHOSLO) capsule 1,334 mg  1,334 mg Oral PRN Radene Gunning, NP      . calcium acetate (PHOSLO) capsule 2,001 mg  2,001 mg Oral TID WC Lezlie Octave Black, NP      . diphenhydrAMINE (BENADRYL) capsule 50 mg  50 mg Oral BID PRN Radene Gunning, NP      . enoxaparin (LOVENOX) injection 40 mg  40 mg Subcutaneous Q24H Lezlie Octave Black, NP      . fenofibrate tablet 54 mg  54 mg Oral Daily Radene Gunning, NP      . Ferric Citrate TABS 2 g  2 g Oral TID WC Lezlie Octave Black, NP      . gabapentin (NEURONTIN) capsule 300 mg  300 mg Oral TID Radene Gunning, NP      . gi cocktail (Maalox,Lidocaine,Donnatal)  30 mL Oral QID PRN Radene Gunning, NP      . HYDROmorphone (DILAUDID) injection 0.5 mg  0.5 mg Intravenous Q4H PRN Lezlie Octave Black, NP      . insulin aspart (novoLOG) injection 0-5 Units  0-5 Units Subcutaneous QHS Lezlie Octave Black, NP      . insulin aspart (novoLOG) injection 0-9 Units  0-9 Units Subcutaneous TID WC Lezlie Octave Black, NP      . insulin glargine (LANTUS) injection 7 Units  7 Units Subcutaneous QHS Lezlie Octave Black, NP      . levothyroxine (SYNTHROID, LEVOTHROID) tablet 100 mcg  100 mcg Oral QHS Radene Gunning, NP      . metoCLOPramide (REGLAN) tablet 5 mg  5 mg Oral TID PC & HS Radene Gunning, NP      . metoprolol tartrate (LOPRESSOR) tablet 25 mg  25 mg Oral BID Radene Gunning, NP      . Derrill Memo ON 02/03/2016] midodrine (PROAMATINE) tablet 20 mg  20 mg Oral Q M,W,F Radene Gunning, NP      . multivitamin (RENA-VIT) tablet 1 tablet  1 tablet Oral QHS Lezlie Octave Black, NP      . nitroGLYCERIN (NITROSTAT) SL tablet 0.4 mg  0.4 mg Sublingual Q5 min PRN Malvin Johns, MD   0.4 mg at 02/02/16 1058  . ondansetron (ZOFRAN) injection 4  mg  4 mg Intravenous Q4H Radene Gunning, NP   4 mg at 02/02/16 1339  . pantoprazole (PROTONIX) EC tablet 40 mg  40 mg Oral Daily Lezlie Octave Black, NP      . promethazine (PHENERGAN) tablet 25 mg  25 mg Oral Q6H PRN Radene Gunning, NP      . rOPINIRole (REQUIP) tablet 2 mg  2 mg Oral Q supper Radene Gunning, NP       Facility-Administered Medications Ordered  in Other Encounters  Medication Dose Route Frequency Provider Last Rate Last Dose  . 0.9 %  sodium chloride infusion   Intravenous Continuous Angelia Mould, MD      . Chlorhexidine Gluconate Cloth 2 % PADS 6 each  6 each Topical Once Angelia Mould, MD       Labs: Basic Metabolic Panel:  Recent Labs Lab 01/29/16 1301 02/02/16 0930  NA 136 134*  K 3.3* 3.5  CL 95* 94*  CO2 34* 30  GLUCOSE 124* 132*  BUN 6 14  CREATININE 3.32* 5.98*  CALCIUM 8.1* 8.4*   CBC:  Recent Labs Lab 01/29/16 1301 02/02/16 0930  WBC 5.1 5.3  NEUTROABS  --  3.4  HGB 8.4 Repeated and verified X2.* 7.9*  HCT 25.8 Repeated and verified X2.* 26.4*  MCV 90.1 96.0  PLT 372.0 375   Cardiac Enzymes: No results for input(s): CKTOTAL, CKMB, CKMBINDEX, TROPONINI in the last 168 hours. CBG:  Recent Labs Lab 02/02/16 0826  GLUCAP 142*   Studies/Results: Dg Chest 2 View  Result Date: 02/02/2016 CLINICAL DATA:  Cough and shortness of breath for several hours EXAM: CHEST  2 VIEW COMPARISON:  01/28/2016 FINDINGS: Cardiac shadow remains enlarged. Mild central vascular congestion is noted without significant interstitial edema. No focal infiltrate is seen. Small bilateral pleural effusions are noted. No acute bony abnormality is seen. IMPRESSION: Mild vascular congestion and small pleural effusions. These have increased in the interval from the prior exam. Electronically Signed   By: Inez Catalina M.D.   On: 02/02/2016 09:50    ROS: As per HPI otherwise negative.  Physical Exam: Vitals:   02/02/16 1102 02/02/16 1230 02/02/16 1300 02/02/16 1431   BP:  161/72 162/79   Pulse:  88 88   Resp:  25 16   Temp:    99.2 F (37.3 C)  TempSrc:    Oral  SpO2:  100% 100%   Weight: 124.7 kg (275 lb)   129.3 kg (285 lb 1.6 oz)  Height: 5\' 8"  (1.727 m)   5\' 8"  (1.727 m)     General: Well developed, well nourished, in no acute distress. On nasal oxygen. Head: Normocephalic, atraumatic, sclera non-icteric, mucus membranes are moist. Neck: Supple without lymphadenopathy/masses. JVD not elevated. Lungs: Clear bilaterally to auscultation without wheezes, rales, or rhonchi. Breathing is unlabored. Heart: RRR with normal S1, S2. No murmurs, rubs, or gallops appreciated. Abdomen: Soft, non-tender, non-distended with normoactive bowel sounds. No rebound/guarding. No obvious abdominal masses. + abd wall edema Musculoskeletal:  Strength and tone appear normal for age. Lower extremities: L AKA without stump edema. RLE with trace tense edema.  Skin: 1+ pitting edema in B arms and flanks Neuro: Alert and oriented X 3. Moves all extremities spontaneously. Psych:  Responds to questions appropriately with a normal affect. Dialysis Access: LUE AVF + thrill/bruit  Dialysis Orders:  Center: MWF at [facility] unit 4:15 hours, EDW 126kg, 2K/2.25Ca bath, BFR 350/DFR800, AVF - Hectoral 53mcg IV q HD - Venofer 50mg  IV weekly - Mircera 260mcg IV q 2 weeks (last given 9/13)  Assessment/Plan: 1.  Chest pain: Per primary team and cardiology. Cardiac enzymes stable; being trended. Echo has been ordered to evaluate for possible endocarditis given recurrent fevers. 2.  Dyspnea/Volume overload: CXR with vascular congestion and clinically with volume excess. Will plan to reduce EDW with dialysis. 3. Nausea/vomiting: ?gastroparesis. 4.  ESRD: MWF schedule. Will plan on HD tomorrow 10/11, will try for 3.5L UF. May need extra HD to  fully correct volume. 5.  Hypertension/volume: As above. Continue meds and will work on volume correction with HD. 6.  Anemia: Hgb 7.9. Starting Aranesp  259mcg IV weekly. Will check iron studies. 7.  Metabolic bone disease: Ca 8.4. Continue binders/VRDA. Will check Phos.  Veneta Penton, PA-C 02/02/2016, 3:47 PM  [facility] Pager: [phone]

## 2016-02-02 NOTE — Progress Notes (Signed)
[facility]  Patient Status: Active (receiving services up to time of hospitalization)  [facility] is providing the following services: RN, PT and OT  If patient discharges after hours, please call [phone].   Dale [name] 02/02/2016, 4:18 PM

## 2016-02-02 NOTE — H&P (Signed)
History and Physical    Dale [name] PZW:[id number] DOB: [birth year] DOA: 02/02/2016  PCP: [name], MD Patient coming from: home  Chief Complaint: chest pain/sob/swelling  HPI: Dale [name] is a 50 y.o. male with medical history significant for end-stage renal disease Monday Wednesday and Friday dialysis after failed transplant, obesity, diabetes on pump, left below the knee amputation, CAD status post PCI, diastolic heart failure, recent non-STEMI status post stents since emergency Department chief complaint chest pressure shortness of breath increased swelling. Initial evaluation concerning for acute on chronic diastolic heart failure, acute on chronic anemia.  Information is obtained from the patient and his wife. Patient was discharged from the hospital 2 weeks ago and according to the wife was not ready for discharge at that time. He's experienced persistent intermittent nausea and vomiting. Over the last week he's developed gradual worsening edema shortness of breath and cough. Today he experienced left anterior "chest pressure". He states he has been compliant with his medications and his dialysis and reports dialysis center "adjusting my dry weight" lately and during that time he feels like his shortness of breath gradually worsened. Today he developed left anterior chest pressure that radiated to his left arm. Wife reports he was restless during the night. He states he used his inhaler with only minimal improvement. He denies headache visual disturbances diaphoresis. He denies fever chills but does report a chronic cough with a little worse. Of note he was in the hospital early September when into hypoxic respiratory failure and was intubated he also had non-STEMI resulting in a cardiac cath with stent placement.    ED Course: In emergency department is afebrile hemodynamically stable and not hypoxic. He was given 1 nitroglycerin.  Review of Systems: As per HPI otherwise 10  point review of systems negative.   Ambulatory Status: Wheelchair-bound  Past Medical History:  Diagnosis Date  . Anemia of chronic disease   . Arthritis    "hands, right knee" (03/19/2014)  . CAD (coronary artery disease)    a. Lexiscan Myoview (11/15):  Inf, apical cap and apical lateral ischemia, EF 56%, inf HK, Moderate Risk;  b. LHC (11/15):  mid to dist Dx 80%, mid RCA 99% (functional CTO), dist RCA 80% >> PCI: balloon angioplasty to mid RCA (could not deliver stent) - plan staged PCI with HSRA of RCA; Dx to be tx medically >> PCI: Rotoblator atherectomy/DES to [facility]  . Cholecystitis    a. 08/27/2011  . Chronic diastolic CHF (congestive heart failure) ([place])    a. Echo 3/13:  EF 55-60%;  b. Echo (11/15):  Mild LVH, EF 60-65%, no RWMA, Gr 1 DD, MAC, mild LAE, normal RVF, mild RAE;  c.  Echo 5/16:  severe LVH, EF 55-60%, no RWMA, Gr 1 DD, MAC, mild LAE  . Claustrophobia    when things get around his face.   . Difficult intubation    only once in 2015 at [facility] haven't had a problem since then  . ESRD (end stage renal disease) ([place])    a. 1995 s/p cadaveric transplant w/ susbequent failure after 18 yrs;  b.Dialysis initiated 07/2011 [facility] M-W[facility]F  . Fibromyalgia   . GERD (gastroesophageal reflux disease)   . Gout    PMH  . History of blood transfusion   . HLD (hyperlipidemia)   . Hypertension   . Hypothyroidism   . Inguinal lymphadenopathy    a. bilateral - s/p biopsy 07/2011  . Insulin dependent diabetes mellitus ([place])  Type I  . OSA (obstructive sleep apnea)    adjustable bed  . Pericardial effusion    a.  Small by CT 08/22/11;  b.  Large by CT 08/27/11  . Pneumonia ~ 2007    Past Surgical History:  Procedure Laterality Date  . AMPUTATION OF REPLICATED TOES Right   . ANGIOPLASTY  04/09/2014   RCA   . APPENDECTOMY  ~ 2006  . ARTERIOVENOUS GRAFT PLACEMENT Left 1993?   forearm  . ARTERIOVENOUS GRAFT PLACEMENT Right 1993?   leg  . ARTERIOVENOUS GRAFT PLACEMENT  Left    Thigh  . Arteriovenous Graft Removed Left    Thigh  . AV FISTULA PLACEMENT  07/29/2011   Procedure: ARTERIOVENOUS (AV) FISTULA CREATION;  Surgeon: Mal Misty, MD;  Location: [place];  Service: Vascular;  Laterality: Right;  Brachial cephalic  . AV FISTULA PLACEMENT Left 01/20/2015   Procedure: LIGATION OF RIGHT ARTERIOVENOUS FISTULA WITH EXCISION OF ANEURYSM;  Surgeon: Angelia Mould, MD;  Location: [place];  Service: Vascular;  Laterality: Left;  . AV FISTULA PLACEMENT Left 04/30/2015   Procedure: CREATION OF LEFT ARM BRACHIO-CEPHALIC ARTERIOVENOUS (AV) FISTULA ;  Surgeon: Angelia Mould, MD;  Location: [place];  Service: Vascular;  Laterality: Left;  . AV Graft PLACEMENT Left 1993?   "attempted one in my wrist; didn't take"  . BELOW KNEE LEG AMPUTATION Left 2010  . CARDIAC CATHETERIZATION  03/19/2014  . CARDIAC CATHETERIZATION  03/19/2014   Procedure: CORONARY BALLOON ANGIOPLASTY;  Surgeon: Burnell Blanks, MD;  Location: [name] CATH LAB;  Service: Cardiovascular;;  . CARDIAC CATHETERIZATION N/A 01/01/2016   Procedure: Left Heart Cath and Coronary Angiography;  Surgeon: Troy Sine, MD;  Location: [place] CV LAB;  Service: Cardiovascular;  Laterality: N/A;  . CARDIAC CATHETERIZATION N/A 01/01/2016   Procedure: Coronary Stent Intervention;  Surgeon: Troy Sine, MD;  Location: [place] CV LAB;  Service: Cardiovascular;  Laterality: N/A;  . CARPAL TUNNEL RELEASE Bilateral after 2005  . CATARACT EXTRACTION W/ INTRAOCULAR LENS  IMPLANT, BILATERAL Bilateral 1990's  . COLONOSCOPY  08/29/2011   Procedure: COLONOSCOPY;  Surgeon: Jerene Bears, MD;  Location: [place];  Service: Gastroenterology;  Laterality: N/A;  . CORONARY ANGIOPLASTY WITH STENT PLACEMENT  04/09/2014       ptca/des mid lad   . ESOPHAGOGASTRODUODENOSCOPY N/A 12/25/2015   Procedure: ESOPHAGOGASTRODUODENOSCOPY (EGD);  Surgeon: Mauri Pole, MD;  Location: [facility] ENDOSCOPY;  Service: Endoscopy;   Laterality: N/A;  bedside  . EYE SURGERY Bilateral    cataract  . FEMORAL ARTERY REPAIR  04/09/2014   ANGIOSEAL  . INSERTION OF DIALYSIS CATHETER  08/01/2011   Procedure: INSERTION OF DIALYSIS CATHETER;  Surgeon: Rosetta Posner, MD;  Location: [place];  Service: Vascular;  Laterality: Right;  insertion of dialysis catheter on right internal jugular vein  . INSERTION OF DIALYSIS CATHETER Right 01/20/2015   Procedure: INSERTION OF DIALYSIS CATHETER - RIGHT INTERNAL JUGULAR ;  Surgeon: Angelia Mould, MD;  Location: [place];  Service: Vascular;  Laterality: Right;  . KIDNEY TRANSPLANT  04/25/1993  . LEFT HEART CATHETERIZATION WITH CORONARY ANGIOGRAM N/A 03/19/2014   Procedure: LEFT HEART CATHETERIZATION WITH CORONARY ANGIOGRAM;  Surgeon: Burnell Blanks, MD;  Location: [facility] CATH LAB;  Service: Cardiovascular;  Laterality: N/A;  . LYMPH NODE BIOPSY  08/10/2011   Procedure: LYMPH NODE BIOPSY;  Surgeon: Merrie Roof, MD;  Location: [place];  Service: General;  Laterality: Left;  left groin lymph biopsy  .  NEUROPLASTY / TRANSPOSITION ULNAR NERVE AT ELBOW Left after 2005  . PERCUTANEOUS CORONARY ROTOBLATOR INTERVENTION (PCI-R) N/A 04/09/2014   Procedure: PERCUTANEOUS CORONARY ROTOBLATOR INTERVENTION (PCI-R);  Surgeon: Burnell Blanks, MD;  Location: [facility] CATH LAB;  Service: Cardiovascular;  Laterality: N/A;  . PERIPHERAL VASCULAR CATHETERIZATION N/A 12/25/2014   Procedure: Upper Extremity Venography;  Surgeon: Conrad Newcomer[place], MD;  Location: [place] CV LAB;  Service: Cardiovascular;  Laterality: N/A;  . PERITONEAL CATHETER INSERTION    . PERITONEAL CATHETER REMOVAL    . REVISON OF ARTERIOVENOUS FISTULA Right 09/10/8414   Procedure: PLICATION / REVISION OF ARTERIOVENOUS FISTULA;  Surgeon: Angelia Mould, MD;  Location: [place];  Service: Vascular;  Laterality: Right;  . REVISON OF ARTERIOVENOUS FISTULA Left 09/01/2015   Procedure: SUPERFICIALIZATION OF LEFT ARM BRACHIOCEPHALIC ARTERIOVENOUS  FISTULA;  Surgeon: Angelia Mould, MD;  Location: [place];  Service: Vascular;  Laterality: Left;  . SHOULDER ARTHROSCOPY Left ~ 2006   frozen  . TOE AMPUTATION Bilateral after 2005  . UMBILICAL HERNIA REPAIR  1990's  . VENOGRAM Right 07/28/2011   Procedure: VENOGRAM;  Surgeon: Conrad Strawberry, MD;  Location: [facility] CATH LAB;  Service: Cardiovascular;  Laterality: Right;  . VITRECTOMY Bilateral     Social History   Social History  . Marital status: Married    Spouse name: N/A  . Number of children: N/A  . Years of education: N/A   Occupational History  . Disabled    Social History Main Topics  . Smoking status: Never Smoker  . Smokeless tobacco: Never Used  . Alcohol use Yes  . Drug use: No  . Sexual activity: Not on file   Other Topics Concern  . Not on file   Social History Narrative   Lives @ home with his wife in [place].     Allergies  Allergen Reactions  . Contrast Media [Iodinated Diagnostic Agents] Rash and Itching    Systemic itching and transient rash appearance within hours of receiving contrast  Systemic itching and transient rash appearance within hours of receiving contrast  Redman Syndrome  . Rocephin [Ceftriaxone] Itching  . Amoxicillin Itching and Rash    .Has patient had a PCN reaction causing immediate rash, facial/tongue/throat swelling, SOB or lightheadedness with hypotension: Yes Has patient had a PCN reaction causing severe rash involving mucus membranes or skin necrosis: No Has patient had a PCN reaction that required hospitalization No Has patient had a PCN reaction occurring within the last 10 years: No If all of the above answers are "NO", then may proceed with Cephalosporin use.  . Buprenorphine Hcl Nausea Only and Hives  . Clopidogrel Rash  . Hydrocodone Rash  . Morphine Nausea Only, Rash and Hives  . Adhesive [Tape] Other (See Comments) and Itching    Blisters on arm from adhesive tape at dialysis Blisters on arm from adhesive tape  at dialysis  . Prednisone Other (See Comments)    Increases sugar levels  . Ciprofloxacin Itching and Rash  . Clindamycin/Lincomycin Hives and Itching  . Codeine Hives, Itching and Rash  . Doxycycline Rash  . Enalapril Rash    cough  . Levofloxacin Hives and Itching  . Mobic [Meloxicam] Hives and Rash  . Oxycodone Rash  . Vasotec Cough    Family History  Problem Relation Age of Onset  . Colon polyps Father   . Diabetes Father   . Hypertension Father   . Hypertension Mother   . Diabetes Mother   . Hyperlipidemia  Mother   . Breast cancer Maternal Aunt   . Cancer Maternal Aunt     Breast and Bone  . Malignant hyperthermia Neg Hx   . Heart attack Neg Hx   . Stroke Neg Hx     Prior to Admission medications   Medication Sig Start Date End Date Taking? Authorizing Provider  albuterol (PROVENTIL HFA;VENTOLIN HFA) 108 (90 Base) MCG/ACT inhaler Inhale 2 puffs into the lungs every 6 (six) hours as needed for wheezing or shortness of breath. 01/26/16  Yes Brand Males, MD  allopurinol (ZYLOPRIM) 100 MG tablet Take 200 mg by mouth daily.  03/14/12  Yes Historical Provider, MD  aspirin EC 81 MG tablet Take 81 mg by mouth at bedtime.   Yes Historical Provider, MD  atorvastatin (LIPITOR) 80 MG tablet Take 1 tablet (80 mg total) by mouth daily at 6 PM. 01/04/16  Yes Florencia Reasons, MD  benzonatate (TESSALON) 100 MG capsule Take 1 capsule (100 mg total) by mouth 2 (two) times daily as needed for cough. 01/26/16  Yes Brand Males, MD  calcium acetate (PHOSLO) 667 MG capsule Take 1,334-2,001 mg by mouth See admin instructions. Take 3 capsules (2001 mg) by mouth with meals and 2 capsule (1334mg  ) with snacks 09/19/12  Yes Historical Provider, MD  diphenhydrAMINE (BENADRYL) 25 mg capsule Take 1-2 capsules (25-50 mg total) by mouth every 6 (six) hours as needed for itching. Patient taking differently: Take 50 mg by mouth 2 (two) times daily as needed for itching or sleep.  04/11/14  Yes Rhonda G  Barrett, PA-C  fenofibrate (TRICOR) 48 MG tablet Take 1 tablet (48 mg total) by mouth daily. 02/27/15  Yes Shanker Kristeen Mans, MD  Ferric Citrate (AURYXIA) 1 GM 210 MG(Fe) TABS Take 2 g by mouth 3 (three) times daily with meals.   Yes Historical Provider, MD  fluticasone (FLONASE) 50 MCG/ACT nasal spray Place 1 spray into both nostrils daily.   Yes Historical Provider, MD  gabapentin (NEURONTIN) 300 MG capsule Take 300 mg by mouth 3 (three) times daily.   Yes Historical Provider, MD  HYDROmorphone (DILAUDID) 2 MG tablet Take 1 tablet (2 mg total) by mouth 3 (three) times daily as needed for severe pain. Patient taking differently: Take 2 mg by mouth 2 (two) times daily. Take 1 tablet (2 mg) by mouth daily at bedtime, may take an additional dose during the day as needed for severe pain 11/20/14  Yes Alvia Grove, PA-C  Insulin Human (INSULIN PUMP) 100 unit/ml SOLN Inject 1 each into the skin continuous. Humalog   Yes Historical Provider, MD  insulin lispro (HUMALOG) 100 UNIT/ML injection Inject 1-175 Units into the skin continuous. Up to 175 units continuous in pump,  Basal rates area as follows:  2400 1.5 units/hr, 0300 1.9 units/hr, 0800 1.45 units/hr, 1200 1.625 units/hr   Yes Historical Provider, MD  levothyroxine (SYNTHROID, LEVOTHROID) 100 MCG tablet Take 100 mcg by mouth at bedtime.    Yes Historical Provider, MD  metoCLOPramide (REGLAN) 5 MG tablet Take 2 tablets (10 mg total) by mouth 4 (four) times daily - after meals and at bedtime. 01/21/16  Yes Velvet Bathe, MD  metoprolol tartrate (LOPRESSOR) 25 MG tablet Take 1 tablet (25 mg total) by mouth 2 (two) times daily. 01/28/16  Yes Eileen Stanford, PA-C  midodrine (PROAMATINE) 10 MG tablet Take 20 mg by mouth every Monday, Wednesday, and Friday. On dialysis days   Yes Historical Provider, MD  multivitamin (RENA-VIT) TABS tablet Take 1  tablet by mouth daily.   Yes Historical Provider, MD  Nutritional Supplements (FEEDING SUPPLEMENT, NEPRO CARB  STEADY,) LIQD Take 237 mLs by mouth daily. 01/04/16  Yes Florencia Reasons, MD  pantoprazole (PROTONIX) 40 MG tablet Take 40 mg by mouth daily.  01/14/16  Yes Historical Provider, MD  Polyethyl Glycol-Propyl Glycol (SYSTANE) 0.4-0.3 % SOLN Place 1 drop into both eyes as needed (dry eyes).   Yes Historical Provider, MD  prasugrel (EFFIENT) 10 MG TABS tablet Take 1 tablet (10 mg total) by mouth daily. 01/28/16 04/27/16 Yes Eileen Stanford, PA-C  promethazine (PHENERGAN) 25 MG tablet Take 1 tablet (25 mg total) by mouth every 6 (six) hours as needed for nausea or vomiting. For nausea 11/27/14  Yes Liliane Shi, PA-C  rOPINIRole (REQUIP) 2 MG tablet Take 2 mg by mouth daily with supper.    Yes Historical Provider, MD  GLUCAGON EMERGENCY 1 MG injection Inject 1 mg into the muscle daily as needed (low blood sugar).  01/19/12   Historical Provider, MD  nitroGLYCERIN (NITROSTAT) 0.4 MG SL tablet Place 0.4 mg under the tongue every 5 (five) minutes as needed for chest pain.     [name], MD    Physical Exam: Vitals:   02/02/16 1000 02/02/16 1015 02/02/16 1030 02/02/16 1102  BP: 135/64  160/68   Pulse: 86 86 88   Resp: 20 25 (!) 28   Temp:      TempSrc:      SpO2: 100% 100% 100%   Weight:    124.7 kg (275 lb)  Height:    5\' 8"  (1.727 m)     General:  Appears calm and Only slightly uncomfortable quite obese Eyes:  PERRL, EOMI, normal lids, iris ENT:  grossly normal hearing, lips & tongue, mucous membranes of his mouth are moist and pink Neck:  no LAD, masses or thyromegaly Cardiovascular:  RRR, no m/r/g. Left BKA right 1-2+ pitting edema up to his by hands quite puffy bilaterally Respiratory:  Mild increased work of breathing breath sounds with fair air movement fine crackles bilateral bases I hear no wheeze no rhonchi Abdomen:  Obese soft positive bowel sounds throughout no guarding or rebounding Skin:  no rash or induration seen on limited exam Musculoskeletal:  grossly normal tone BUE/BLE, good  ROM, no bony abnormality Psychiatric:  grossly normal mood and affect, speech fluent and appropriate, AOx3 Neurologic:  CN 2-12 grossly intact, moves all extremities in coordinated fashion, sensation intact speech clear facial symmetry  Labs on Admission: I have personally reviewed following labs and imaging studies  CBC:  Recent Labs Lab 01/29/16 1301 02/02/16 0930  WBC 5.1 5.3  NEUTROABS  --  3.4  HGB 8.4 Repeated and verified X2.* 7.9*  HCT 25.8 Repeated and verified X2.* 26.4*  MCV 90.1 96.0  PLT 372.0 725   Basic Metabolic Panel:  Recent Labs Lab 01/29/16 1301 02/02/16 0930  NA 136 134*  K 3.3* 3.5  CL 95* 94*  CO2 34* 30  GLUCOSE 124* 132*  BUN 6 14  CREATININE 3.32* 5.98*  CALCIUM 8.1* 8.4*   GFR: Estimated Creatinine Clearance: 19 mL/min (by C-G formula based on SCr of 5.98 mg/dL (H)). Liver Function Tests: No results for input(s): AST, ALT, ALKPHOS, BILITOT, PROT, ALBUMIN in the last 168 hours. No results for input(s): LIPASE, AMYLASE in the last 168 hours. No results for input(s): AMMONIA in the last 168 hours. Coagulation Profile: No results for input(s): INR, PROTIME in the last 168  hours. Cardiac Enzymes: No results for input(s): CKTOTAL, CKMB, CKMBINDEX, TROPONINI in the last 168 hours. BNP (last 3 results) No results for input(s): PROBNP in the last 8760 hours. HbA1C: No results for input(s): HGBA1C in the last 72 hours. CBG:  Recent Labs Lab 02/02/16 0826  GLUCAP 142*   Lipid Profile: No results for input(s): CHOL, HDL, LDLCALC, TRIG, CHOLHDL, LDLDIRECT in the last 72 hours. Thyroid Function Tests: No results for input(s): TSH, T4TOTAL, FREET4, T3FREE, THYROIDAB in the last 72 hours. Anemia Panel: No results for input(s): VITAMINB12, FOLATE, FERRITIN, TIBC, IRON, RETICCTPCT in the last 72 hours. Urine analysis:    Component Value Date/Time   COLORURINE YELLOW 06/16/2013 2059   APPEARANCEUR CLEAR 06/16/2013 2059   LABSPEC 1.018  06/16/2013 2059   PHURINE 5.5 06/16/2013 2059   GLUCOSEU NEGATIVE 06/16/2013 2059   HGBUR SMALL (A) 06/16/2013 2059   BILIRUBINUR NEGATIVE 06/16/2013 2059   [place] NEGATIVE 06/16/2013 2059   PROTEINUR 100 (A) 06/16/2013 2059   UROBILINOGEN 0.2 06/16/2013 2059   NITRITE NEGATIVE 06/16/2013 2059   LEUKOCYTESUR NEGATIVE 06/16/2013 2059    Creatinine Clearance: Estimated Creatinine Clearance: 19 mL/min (by C-G formula based on SCr of 5.98 mg/dL (H)).  Sepsis Labs: @LABRCNTIP (procalcitonin:4,lacticidven:4) )No results found for this or any previous visit (from the past 240 hour(s)).   Radiological Exams on Admission: Dg Chest 2 View  Result Date: 02/02/2016 CLINICAL DATA:  Cough and shortness of breath for several hours EXAM: CHEST  2 VIEW COMPARISON:  01/28/2016 FINDINGS: Cardiac shadow remains enlarged. Mild central vascular congestion is noted without significant interstitial edema. No focal infiltrate is seen. Small bilateral pleural effusions are noted. No acute bony abnormality is seen. IMPRESSION: Mild vascular congestion and small pleural effusions. These have increased in the interval from the prior exam. Electronically Signed   By: Inez Catalina M.D.   On: 02/02/2016 09:50    EKG: Independently reviewed. Normal sinus rhythm Prolonged QT Abnormal ECG  Assessment/Plan Principal Problem:   Chest pain at rest Active Problems:   Obstructive sleep apnea   Acute on chronic diastolic heart failure (HCC)   Pericardial effusion   Nausea   HTN (hypertension)   Coronary artery disease involving native coronary artery of native heart with unstable angina pectoris (HCC)   ESRD on dialysis (HCC)   Chronic pain   Diabetes mellitus ([place])   Chest pain   Anemia of chronic disease    #1. Chest pain at rest. Heart Score 5. Recent non-ST elevated myocardial infarction status post cath with stent placement. Initial troponin negative EKG without acute changes chest x-ray with mild  vascular congestion slight worsening pleural effusion.  -Admit to telemetry -Cycle troponin -Serial EKG -Continue home aspirin and statin fenofibrate -Supportive therapy -Cardiology consult  # 2. Acute on chronic diastolic heart failure. BNP 1185. Exam consistent with fluid overload. Echo done in August of this year reveals an EF of 4174% grade 2 diastolic dysfunction. Home medications include Lopressor. Chest x-ray and BNP as noted above. -Cardiology consult requested by ED M.D. -Nephrology consult requested by ED M.D. may need extra dialysis session -intake and output -Obtain daily weights  #3. End-stage renal disease on dialysis. Monday Wednesday Friday schedule. Creatinine 5.9. Chart review indicates this is not far from baseline. Potassium 3.5. BNP elevated chest x-ray with mild vascular congestion -Nephrology consult -Continue home meds  #4. Anemia of chronic disease. Hemoglobin 7.9. Chart review indicates 2 weeks ago hemoglobin 9.1. Of note he underwent EGD last hospitalization revealing Normal  esophagus. - Erythematous mucosa in the cardia, gastric fundus and gastric body. Normal examined duodenum. - Did not identify any high risk lesion on upper endoscopy to cause possible brisk upper GI blood loss  No specimens collected -FOBT -Monitor closely -Consider transfusion if hemoglobin drops to 7  5. Diabetes. Patient is on insulin pump at home. Serum glucose 132 on admission. Patient reports history of hypoglycemic events when in the hospital and on his pump request discontinuation of his pump -Obtain a hemoglobin A1c -Sliding scale insulin for optimal control -Low-dose Lantus -Monitor  #. Hypertension. Fair control in the emergency department. -Continue home beta blocker -Monitor  #7. Chronic pain. He is on oral Dilaudid but requesting IV Dilaudid as his " fibromyalgia pain is much worse when he is in the hospital".  -Continue gabapentin -Discontinue his home Dilaudid and  provide very low dose IV Dilaudid to be discontinued quickly  #8. Persistent nausea vomiting. Wife reports follow-up appointment with GI at the end of the week. This is been going on for several weeks. Home medications include Reglan, Phenergan -We'll provide scheduled Zofran for 5 doses -Continue home medications -Monitor intake -Patient requesting food on admission  #9. Obstructive sleep apnea. He says he was scheduled for an evaluation next week.  10. Obesity. BMI 41.9. -Nutritional consult  #11. CAD. Recent non-STEMI with positive troponin. Heart cath with DES on September 8. See #1 -Monitor -Cardiology consult requested    DVT prophylaxis: lovenox  Code Status: full  Family Communication: wife at bedside  Disposition Plan: home  Consults called: nephrology and cardiology requested by ED MD  Admission status: obs    Radene Gunning MD [facility]  If 7PM-7AM, please contact night-coverage [email or web address] Password [email or web address]  02/02/2016, 1:11 PM

## 2016-02-02 NOTE — Consult Note (Signed)
Cardiology Consultation Note    Patient ID: Dale [name], MRN: [id number], DOB/AGE: 01-17-1966 50 y.o. Admit date: 02/02/2016   Date of Consult: 02/02/2016 Primary Physician: [name], MD Primary Cardiologist: Dr. [name]  Chief Complaint: chest pressure, nightly fevers Reason for Consultation: chest pain Requesting MD: Dr. Tamera Punt  HPI: Dale [name] is a 50 y.o. male with history of ESRD on HD (transplant 1995 with failure after 18 yrs), fibromyalgia, chronic anemia requiring recent transfusions, CAD (s/p DES to [facility] 01/2014 and NSTEMI/DES to diagonal (01/01/16), contrast allergy, Type I DM with gastroparesis, HTN, sleep apnea, PAD status post left BKA, tracheobronchomalacia, chronic diastolic CHF, GERD, gout, morbid obesity, hyperlipidemia, inguinal lymphadenopathy, pericardial effusion (not seen on 11/2015 echo) who presented to [facility] with chest pain. He has been seen in the health system multiple times over the last few months for several problems. He was admitted 8/28-9/11/17 with left arm pain, hyperkalemia of 7, anemia with Hgb 7.9 requiring multiple units of blood with persistent anemia. He had acute respiratory failure requiring intubation as well. His troponin peaked at 18 with evidence of volume overload. EGD with no obvious source of bleed, question friable gastric mucosa. He underwent nuclear stress test which was abnormal with subsequent cath on 01/01/16 which showed 2V obstructive coronary disease with mild calcification of the proximal LAD with 30% narrowing, progressive 95% mid diagonal stenosis, 30% mid LAD narrowing; normal left circumflex coronary artery; and calcified large RCA with a widely patent stent in the mid RCA at the site of previous atherectomy, 30% acute margin narrowing, and 50% inferior branch PDA stenosis.He underwent successful PCI/DES to diagonal vessel. He was started on aspirin and Brilinta and discharged 01/04/16. He was readmitted 01/19/16 with  nausea/vomiting ultimately felt 2/2 diabetic gastroparesis. CT abd pelvis with small left inguinal hematoma slightly increased in size compared to prior study, pseudoaneurysm not excluded, diffuse subcutaneous stranding and edema, no bowel obstruction or active inflammation. He also has since followed up with pulmonology for persistent cough with CT chest 10/5 showing mild patchy ground glass attenuation and some interlobular septal thickening in the lungs, dilation of pulm trunk concerning for PAH, severe tracheobronchomalacia with mild air trapping, atherosclerosis, multiple borderline enlarged and minimally enlarged mediastinal lymph nodes, similar to the prior study, reported as likely chronic in the setting of underlying congestive heart failure. Pulm NP OV from 10/6 not yet available for review.  Ever since discharge on 01/21/16 he's noticed a nightly fever that starts around 4-5pm, peak temp 101 thus far. Review of recent admission corroborates fevers. Blood cx neg 9/27. He usually Tylenol and then develops sweats around midnight when the fever breaks. Last night he had a restless night when chest pressure on/off all night, worse when laying back, better laying on right side. This morning he felt the chest pressure in a persistent fashion associated with tingling in his middle, 4th, and 5th fingers as well as a cramp in the left side of his neck. It was not worse with inspiration, palpation or movement. Due to persistent discomfort he presented to the ER. CXR with mild vasc congestion and small effusions, increase from prior exam. Cr 5.98, Na 134, BNP 1185, trop neg x 2, Hgb 7.9 (8.4 during OV 10/6). BP 694-854O systolic. He says his dry weight has been downward adjusted several times lately. He was told the low albumin is making it difficult to fully pull off fluid - last albumin 2.5. He reports poor oral intake due to the  fact that anything he eats makes him nauseated. IM note indicates he requested IV  dilaudid while inpatient as his fibromyalgia worsens when he is in the hospital. Weight up 5lb since 9/27. He is frustrated at his continued symptomatology, stating he feels like each time he is admitted, the team is putting a band-aid over his problems rather than fixing them.    Past Medical History:  Diagnosis Date  . Anemia of chronic disease   . Arthritis    "hands, right knee" (03/19/2014)  . CAD (coronary artery disease)    a. Lexiscan Myoview (11/15):  Inf, apical cap and apical lateral ischemia, EF 56%, inf HK, Moderate Risk;  b. LHC (11/15):  mid to dist Dx 80%, mid RCA 99% (functional CTO), dist RCA 80% >> PCI: balloon angioplasty to mid RCA (could not deliver stent) - plan staged PCI with HSRA of RCA; Dx to be tx medically >> PCI: Rotoblator atherectomy/DES to [facility]  . Cholecystitis    a. 08/27/2011  . Chronic diastolic CHF (congestive heart failure) ([place])    a. Echo 3/13:  EF 55-60%;  b. Echo (11/15):  Mild LVH, EF 60-65%, no RWMA, Gr 1 DD, MAC, mild LAE, normal RVF, mild RAE;  c.  Echo 5/16:  severe LVH, EF 55-60%, no RWMA, Gr 1 DD, MAC, mild LAE  . Claustrophobia    when things get around his face.   . Difficult intubation    only once in 2015 at [facility] haven't had a problem since then  . ESRD (end stage renal disease) ([place])    a. 1995 s/p cadaveric transplant w/ susbequent failure after 18 yrs;  b.Dialysis initiated 07/2011 [facility] M-W-F  . Fibromyalgia   . GERD (gastroesophageal reflux disease)   . Gout    PMH  . History of blood transfusion   . HLD (hyperlipidemia)   . Hypertension   . Hypothyroidism   . Inguinal lymphadenopathy    a. bilateral - s/p biopsy 07/2011  . Insulin dependent diabetes mellitus (HCC)    Type I  . Morbid obesity ([place])   . OSA (obstructive sleep apnea)    adjustable bed  . Pericardial effusion    a.  Small by CT 08/22/11;  b.  Large by CT 08/27/11  . Pneumonia ~ 2007      Surgical History:  Past Surgical History:  Procedure  Laterality Date  . AMPUTATION OF REPLICATED TOES Right   . ANGIOPLASTY  04/09/2014   RCA   . APPENDECTOMY  ~ 2006  . ARTERIOVENOUS GRAFT PLACEMENT Left 1993?   forearm  . ARTERIOVENOUS GRAFT PLACEMENT Right 1993?   leg  . ARTERIOVENOUS GRAFT PLACEMENT Left    Thigh  . Arteriovenous Graft Removed Left    Thigh  . AV FISTULA PLACEMENT  07/29/2011   Procedure: ARTERIOVENOUS (AV) FISTULA CREATION;  Surgeon: Mal Misty, MD;  Location: [place];  Service: Vascular;  Laterality: Right;  Brachial cephalic  . AV FISTULA PLACEMENT Left 01/20/2015   Procedure: LIGATION OF RIGHT ARTERIOVENOUS FISTULA WITH EXCISION OF ANEURYSM;  Surgeon: Angelia Mould, MD;  Location: [place];  Service: Vascular;  Laterality: Left;  . AV FISTULA PLACEMENT Left 04/30/2015   Procedure: CREATION OF LEFT ARM BRACHIO-CEPHALIC ARTERIOVENOUS (AV) FISTULA ;  Surgeon: Angelia Mould, MD;  Location: [place];  Service: Vascular;  Laterality: Left;  . AV Graft PLACEMENT Left 1993?   "attempted one in my wrist; didn't take"  . BELOW KNEE LEG AMPUTATION Left  2010  . CARDIAC CATHETERIZATION  03/19/2014  . CARDIAC CATHETERIZATION  03/19/2014   Procedure: CORONARY BALLOON ANGIOPLASTY;  Surgeon: Burnell Blanks, MD;  Location: [facility] CATH LAB;  Service: Cardiovascular;;  . CARDIAC CATHETERIZATION N/A 01/01/2016   Procedure: Left Heart Cath and Coronary Angiography;  Surgeon: Troy Sine, MD;  Location: [place] CV LAB;  Service: Cardiovascular;  Laterality: N/A;  . CARDIAC CATHETERIZATION N/A 01/01/2016   Procedure: Coronary Stent Intervention;  Surgeon: Troy Sine, MD;  Location: [place] CV LAB;  Service: Cardiovascular;  Laterality: N/A;  . CARPAL TUNNEL RELEASE Bilateral after 2005  . CATARACT EXTRACTION W/ INTRAOCULAR LENS  IMPLANT, BILATERAL Bilateral 1990's  . COLONOSCOPY  08/29/2011   Procedure: COLONOSCOPY;  Surgeon: Jerene Bears, MD;  Location: [place];  Service: Gastroenterology;  Laterality: N/A;    . CORONARY ANGIOPLASTY WITH STENT PLACEMENT  04/09/2014       ptca/des mid lad   . ESOPHAGOGASTRODUODENOSCOPY N/A 12/25/2015   Procedure: ESOPHAGOGASTRODUODENOSCOPY (EGD);  Surgeon: Mauri Pole, MD;  Location: [facility] ENDOSCOPY;  Service: Endoscopy;  Laterality: N/A;  bedside  . EYE SURGERY Bilateral    cataract  . FEMORAL ARTERY REPAIR  04/09/2014   ANGIOSEAL  . INSERTION OF DIALYSIS CATHETER  08/01/2011   Procedure: INSERTION OF DIALYSIS CATHETER;  Surgeon: Rosetta Posner, MD;  Location: [place];  Service: Vascular;  Laterality: Right;  insertion of dialysis catheter on right internal jugular vein  . INSERTION OF DIALYSIS CATHETER Right 01/20/2015   Procedure: INSERTION OF DIALYSIS CATHETER - RIGHT INTERNAL JUGULAR ;  Surgeon: Angelia Mould, MD;  Location: [place];  Service: Vascular;  Laterality: Right;  . KIDNEY TRANSPLANT  04/25/1993  . LEFT HEART CATHETERIZATION WITH CORONARY ANGIOGRAM N/A 03/19/2014   Procedure: LEFT HEART CATHETERIZATION WITH CORONARY ANGIOGRAM;  Surgeon: Burnell Blanks, MD;  Location: [name] CATH LAB;  Service: Cardiovascular;  Laterality: N/A;  . LYMPH NODE BIOPSY  08/10/2011   Procedure: LYMPH NODE BIOPSY;  Surgeon: Merrie Roof, MD;  Location: [place];  Service: General;  Laterality: Left;  left groin lymph biopsy  . NEUROPLASTY / TRANSPOSITION ULNAR NERVE AT ELBOW Left after 2005  . PERCUTANEOUS CORONARY ROTOBLATOR INTERVENTION (PCI-R) N/A 04/09/2014   Procedure: PERCUTANEOUS CORONARY ROTOBLATOR INTERVENTION (PCI-R);  Surgeon: Burnell Blanks, MD;  Location: [facility] CATH LAB;  Service: Cardiovascular;  Laterality: N/A;  . PERIPHERAL VASCULAR CATHETERIZATION N/A 12/25/2014   Procedure: Upper Extremity Venography;  Surgeon: Conrad Metamora, MD;  Location: [place] CV LAB;  Service: Cardiovascular;  Laterality: N/A;  . PERITONEAL CATHETER INSERTION    . PERITONEAL CATHETER REMOVAL    . REVISON OF ARTERIOVENOUS FISTULA Right 12/26/4095   Procedure: PLICATION /  REVISION OF ARTERIOVENOUS FISTULA;  Surgeon: Angelia Mould, MD;  Location: [place];  Service: Vascular;  Laterality: Right;  . REVISON OF ARTERIOVENOUS FISTULA Left 09/01/2015   Procedure: SUPERFICIALIZATION OF LEFT ARM BRACHIOCEPHALIC ARTERIOVENOUS FISTULA;  Surgeon: Angelia Mould, MD;  Location: [place];  Service: Vascular;  Laterality: Left;  . SHOULDER ARTHROSCOPY Left ~ 2006   frozen  . TOE AMPUTATION Bilateral after 2005  . UMBILICAL HERNIA REPAIR  1990's  . VENOGRAM Right 07/28/2011   Procedure: VENOGRAM;  Surgeon: Conrad Percy, MD;  Location: [facility] CATH LAB;  Service: Cardiovascular;  Laterality: Right;  . VITRECTOMY Bilateral      Home Meds: Prior to Admission medications   Medication Sig Start Date End Date Taking? Authorizing Provider  albuterol (  PROVENTIL HFA;VENTOLIN HFA) 108 (90 Base) MCG/ACT inhaler Inhale 2 puffs into the lungs every 6 (six) hours as needed for wheezing or shortness of breath. 01/26/16  Yes Brand Males, MD  allopurinol (ZYLOPRIM) 100 MG tablet Take 200 mg by mouth daily.  03/14/12  Yes Historical Provider, MD  aspirin EC 81 MG tablet Take 81 mg by mouth at bedtime.   Yes Historical Provider, MD  atorvastatin (LIPITOR) 80 MG tablet Take 1 tablet (80 mg total) by mouth daily at 6 PM. 01/04/16  Yes Florencia Reasons, MD  benzonatate (TESSALON) 100 MG capsule Take 1 capsule (100 mg total) by mouth 2 (two) times daily as needed for cough. 01/26/16  Yes Brand Males, MD  calcium acetate (PHOSLO) 667 MG capsule Take 1,334-2,001 mg by mouth See admin instructions. Take 3 capsules (2001 mg) by mouth with meals and 2 capsule (1334mg  ) with snacks 09/19/12  Yes Historical Provider, MD  diphenhydrAMINE (BENADRYL) 25 mg capsule Take 1-2 capsules (25-50 mg total) by mouth every 6 (six) hours as needed for itching. Patient taking differently: Take 50 mg by mouth 2 (two) times daily as needed for itching or sleep.  04/11/14  Yes Rhonda G Barrett, PA-C  fenofibrate (TRICOR) 48  MG tablet Take 1 tablet (48 mg total) by mouth daily. 02/27/15  Yes Shanker Kristeen Mans, MD  Ferric Citrate (AURYXIA) 1 GM 210 MG(Fe) TABS Take 2 g by mouth 3 (three) times daily with meals.   Yes Historical Provider, MD  fluticasone (FLONASE) 50 MCG/ACT nasal spray Place 1 spray into both nostrils daily.   Yes Historical Provider, MD  gabapentin (NEURONTIN) 300 MG capsule Take 300 mg by mouth 3 (three) times daily.   Yes Historical Provider, MD  HYDROmorphone (DILAUDID) 2 MG tablet Take 1 tablet (2 mg total) by mouth 3 (three) times daily as needed for severe pain. Patient taking differently: Take 2 mg by mouth 2 (two) times daily. Take 1 tablet (2 mg) by mouth daily at bedtime, may take an additional dose during the day as needed for severe pain 11/20/14  Yes Alvia Grove, PA-C  Insulin Human (INSULIN PUMP) 100 unit/ml SOLN Inject 1 each into the skin continuous. Humalog   Yes Historical Provider, MD  insulin lispro (HUMALOG) 100 UNIT/ML injection Inject 1-175 Units into the skin continuous. Up to 175 units continuous in pump,  Basal rates area as follows:  2400 1.5 units/hr, 0300 1.9 units/hr, 0800 1.45 units/hr, 1200 1.625 units/hr   Yes Historical Provider, MD  levothyroxine (SYNTHROID, LEVOTHROID) 100 MCG tablet Take 100 mcg by mouth at bedtime.    Yes Historical Provider, MD  metoCLOPramide (REGLAN) 5 MG tablet Take 2 tablets (10 mg total) by mouth 4 (four) times daily - after meals and at bedtime. 01/21/16  Yes Velvet Bathe, MD  metoprolol tartrate (LOPRESSOR) 25 MG tablet Take 1 tablet (25 mg total) by mouth 2 (two) times daily. 01/28/16  Yes Eileen Stanford, PA-C  midodrine (PROAMATINE) 10 MG tablet Take 20 mg by mouth every Monday, Wednesday, and Friday. On dialysis days   Yes Historical Provider, MD  multivitamin (RENA-VIT) TABS tablet Take 1 tablet by mouth daily.   Yes Historical Provider, MD  Nutritional Supplements (FEEDING SUPPLEMENT, NEPRO CARB STEADY,) LIQD Take 237 mLs by mouth  daily. 01/04/16  Yes Florencia Reasons, MD  pantoprazole (PROTONIX) 40 MG tablet Take 40 mg by mouth daily.  01/14/16  Yes Historical Provider, MD  Polyethyl Glycol-Propyl Glycol (SYSTANE) 0.4-0.3 % SOLN Place 1  drop into both eyes as needed (dry eyes).   Yes Historical Provider, MD  prasugrel (EFFIENT) 10 MG TABS tablet Take 1 tablet (10 mg total) by mouth daily. 01/28/16 04/27/16 Yes Eileen Stanford, PA-C  promethazine (PHENERGAN) 25 MG tablet Take 1 tablet (25 mg total) by mouth every 6 (six) hours as needed for nausea or vomiting. For nausea 11/27/14  Yes Liliane Shi, PA-C  rOPINIRole (REQUIP) 2 MG tablet Take 2 mg by mouth daily with supper.    Yes Historical Provider, MD  GLUCAGON EMERGENCY 1 MG injection Inject 1 mg into the muscle daily as needed (low blood sugar).  01/19/12   Historical Provider, MD  nitroGLYCERIN (NITROSTAT) 0.4 MG SL tablet Place 0.4 mg under the tongue every 5 (five) minutes as needed for chest pain.     [name], MD    Inpatient Medications:  . allopurinol  200 mg Oral Daily  . [START ON 02/03/2016] aspirin EC  81 mg Oral QHS  . atorvastatin  80 mg Oral q1800  . calcium acetate  2,001 mg Oral TID WC  . enoxaparin (LOVENOX) injection  40 mg Subcutaneous Q24H  . fenofibrate  54 mg Oral Daily  . Ferric Citrate  2 g Oral TID WC  . gabapentin  300 mg Oral TID  . insulin aspart  0-5 Units Subcutaneous QHS  . insulin aspart  0-9 Units Subcutaneous TID WC  . insulin glargine  7 Units Subcutaneous QHS  . levothyroxine  100 mcg Oral QHS  . metoCLOPramide  5 mg Oral TID PC & HS  . metoprolol tartrate  25 mg Oral BID  . [START ON 02/03/2016] midodrine  20 mg Oral Q M,W,F  . multivitamin  1 tablet Oral QHS  . ondansetron (ZOFRAN) IV  4 mg Intravenous Q4H  . pantoprazole  40 mg Oral Daily  . rOPINIRole  2 mg Oral Q supper   . insulin lispro      Allergies:  Allergies  Allergen Reactions  . Contrast Media [Iodinated Diagnostic Agents] Rash and Itching    Systemic  itching and transient rash appearance within hours of receiving contrast  Systemic itching and transient rash appearance within hours of receiving contrast  Redman Syndrome  . Rocephin [Ceftriaxone] Itching  . Amoxicillin Itching and Rash    .Has patient had a PCN reaction causing immediate rash, facial/tongue/throat swelling, SOB or lightheadedness with hypotension: Yes Has patient had a PCN reaction causing severe rash involving mucus membranes or skin necrosis: No Has patient had a PCN reaction that required hospitalization No Has patient had a PCN reaction occurring within the last 10 years: No If all of the above answers are "NO", then may proceed with Cephalosporin use.  . Buprenorphine Hcl Nausea Only and Hives  . Clopidogrel Rash  . Hydrocodone Rash  . Morphine Nausea Only, Rash and Hives  . Adhesive [Tape] Other (See Comments) and Itching    Blisters on arm from adhesive tape at dialysis Blisters on arm from adhesive tape at dialysis  . Prednisone Other (See Comments)    Increases sugar levels  . Ciprofloxacin Itching and Rash  . Clindamycin/Lincomycin Hives and Itching  . Codeine Hives, Itching and Rash  . Doxycycline Rash  . Enalapril Rash    cough  . Levofloxacin Hives and Itching  . Mobic [Meloxicam] Hives and Rash  . Oxycodone Rash  . Vasotec Cough    Social History   Social History  . Marital status: Married  Spouse name: N/A  . Number of children: N/A  . Years of education: N/A   Occupational History  . Disabled    Social History Main Topics  . Smoking status: Never Smoker  . Smokeless tobacco: Never Used  . Alcohol use Yes  . Drug use: No  . Sexual activity: Not on file   Other Topics Concern  . Not on file   Social History Narrative   Lives @ home with his wife in [place].      Family History  Problem Relation Age of Onset  . Colon polyps Father   . Diabetes Father   . Hypertension Father   . Hypertension Mother   . Diabetes Mother    . Hyperlipidemia Mother   . Breast cancer Maternal Aunt   . Cancer Maternal Aunt     Breast and Bone  . Malignant hyperthermia Neg Hx   . Heart attack Neg Hx   . Stroke Neg Hx      Review of Systems: All other systems reviewed and are otherwise negative except as noted above.  Labs: No results for input(s): CKTOTAL, CKMB, TROPONINI in the last 72 hours. Lab Results  Component Value Date   WBC 5.3 02/02/2016   HGB 7.9 (L) 02/02/2016   HCT 26.4 (L) 02/02/2016   MCV 96.0 02/02/2016   PLT 375 02/02/2016    Recent Labs Lab 02/02/16 0930  NA 134*  K 3.5  CL 94*  CO2 30  BUN 14  CREATININE 5.98*  CALCIUM 8.4*  GLUCOSE 132*   Lab Results  Component Value Date   CHOL 143 02/25/2015   HDL 29 (L) 02/25/2015   LDLCALC 63 02/25/2015   TRIG 255 (H) 02/25/2015   No results found for: DDIMER  Radiology/Studies:  Ct Abdomen Pelvis Wo Contrast  Result Date: 01/19/2016 CLINICAL DATA:  50 year old male with abdominal pain, nausea vomiting. Low grade fever. EXAM: CT ABDOMEN AND PELVIS WITHOUT CONTRAST TECHNIQUE: Multidetector CT imaging of the abdomen and pelvis was performed following the standard protocol without IV contrast. COMPARISON:  Abdominal CT dated 12/1915 FINDINGS: Evaluation of this exam is limited in the absence of intravenous contrast. The visualized lung bases are clear. A There is coronary vascular calcification. There is hypoattenuation of the cardiac blood pool suggestive of a degree of anemia. Clinical correlation is recommended. No intra-abdominal free air or free fluid. Focal area of hypodensity in the periphery of the left lobe of the liver along the falciform ligament appears similar to prior study and most likely represents a focal fatty infiltration. No intrahepatic biliary ductal dilatation. The gallbladder is mildly distended. No definite calcified gallstone identified. No pericholecystic fluid. The pancreas is somewhat atrophic for the patient's age. No  pancreatic ductal dilatation or peripancreatic stranding. The spleen and the adrenal glands appear unremarkable. The native kidneys are atrophic. Small bilateral renal hypodense lesions are not characterized But likely represent cysts. The visualized ureters appear unremarkable. The urinary bladder is collapsed. A left hemipelvic transplant kidney is noted which appears similar in morphology to the prior study. There is no hydronephrosis or nephrolithiasis of the transplant kidney. No peritransplant fluid collection identified. There is obliterated appearance of the renal sinus fat similar to the prior study but new compared to the CT dated 2014 concerning for failed transplant for chronic rejection. Clinical correlation is recommended. The prostate and seminal vesicles are grossly unremarkable. Small scattered colonic diverticula without active inflammatory changes. Constipation. There is no evidence of bowel obstruction or active inflammation.  Appendectomy. There is moderate aortoiliac atherosclerotic disease. The abdominal aorta and IVC are grossly unremarkable on this noncontrast study. No portal venous gas identified. There is atherosclerotic calcification of the mesentery vasculature. There is bilateral iliac artery atherosclerotic calcification. There is no retroperitoneal or mesenteric adenopathy. A 2.2 x 5.6 cm ovoid structure in the left inguinal region appears slightly larger compared to prior study. This may represent a small focal hematoma related to catheterization. Please see the aneurysm is not excluded. Duplex ultrasound may provide better evaluation. There is mild diffuse stranding and edema of the subcutaneous fat and an area of induration of the skin in the anterior pelvis. No drainable fluid collection/abscess. Right inguinal surgical clips noted. There is a small infraumbilical hernia containing a short segment of small bowel loop without evidence of obstruction or inflammation. There is  degenerative changes of the spine. No acute fracture. L1 inferior endplate Schmorl's node. IMPRESSION: No acute intra-abdominal pelvic pathology. Small left inguinal hematoma slightly increased in size compared to prior study. A pseudo aneurysm is not excluded. Duplex ultrasound may provide better evaluation. Left lower quadrant transplant kidney similar to the prior CT. There is obliteration of the renal sinus fat concerning for failed transplant. Constipation.  No bowel obstruction or active inflammation. Small scattered colonic diverticula. Diffuse subcutaneous stranding and edema. Electronically Signed   By: Anner Crete M.D.   On: 01/19/2016 21:45   Ct Abdomen Pelvis Wo Contrast  Result Date: 01/12/2016 CLINICAL DATA:  Mid abdominal pain for 2 days, initial encounter EXAM: CT ABDOMEN AND PELVIS WITHOUT CONTRAST TECHNIQUE: Multidetector CT imaging of the abdomen and pelvis was performed following the standard protocol without IV contrast. COMPARISON:  09/30/2014, 07/04/2012 FINDINGS: Lower chest: Minimal atelectatic changes are noted. No focal confluent infiltrate is seen. Coronary calcifications are noted. Hepatobiliary: No focal liver abnormality is seen. No gallstones, gallbladder wall thickening, or biliary dilatation. Pancreas: Unremarkable. No pancreatic ductal dilatation or surrounding inflammatory changes. Spleen: Normal in size without focal abnormality. Adrenals/Urinary Tract: The adrenal glands are within normal limits. The kidneys are of small and shrunken consistent with the given clinical history of end-stage renal disease. The overall appearance of the kidneys is stable from the prior exam. Left pelvic renal transplant kidney is noted. No obstructive changes are seen. Some vascular calcifications are noted. The renal sinus fat seen on the previous exam of 2014 is not visualized on today's exam and mild perinephric inflammatory changes are noted. This is of uncertain significance as the  patient is known to be on current hemodialysis. These changes may be related to chronic rejection. Clinical correlation is recommended. The bladder is decompressed. Stomach/Bowel: Scattered diverticular changes noted without evidence of diverticulitis. The appendix appears to have been surgically removed. No obstructive changes are seen. Vascular/Lymphatic: Scattered aortoiliac calcifications are noted without aneurysmal dilatation. No sizable lymphadenopathy is identified. Reproductive: Within normal limits. Other: Laxity of the anterior abdominal wall is again identified related to prior surgeries. Some soft tissue prominence is noted adjacent to the common femoral artery on the left likely related to the recent cardiac catheterization and a small subcutaneous hematoma. No pseudoaneurysm is identified. Musculoskeletal: No acute or significant osseous findings. IMPRESSION: Changes of left pelvic renal transplant although obliteration of the renal sinus fat is noted when compared with the prior exam of 2014. This is of uncertain significance but may be related to chronic projection given the patient's current hemodialysis status. Diverticulosis without diverticulitis. Shrunken native kidneys consistent with end-stage renal disease. Changes consistent with the  recent cardiac catheterization in the left groin. Small hematoma is noted measuring 3.5 cm in greatest dimension. No acute abnormality is noted. Electronically Signed   By: Inez Catalina M.D.   On: 01/12/2016 11:08   Dg Chest 2 View  Result Date: 02/02/2016 CLINICAL DATA:  Cough and shortness of breath for several hours EXAM: CHEST  2 VIEW COMPARISON:  01/28/2016 FINDINGS: Cardiac shadow remains enlarged. Mild central vascular congestion is noted without significant interstitial edema. No focal infiltrate is seen. Small bilateral pleural effusions are noted. No acute bony abnormality is seen. IMPRESSION: Mild vascular congestion and small pleural effusions.  These have increased in the interval from the prior exam. Electronically Signed   By: Inez Catalina M.D.   On: 02/02/2016 09:50   Dg Chest 2 View  Result Date: 01/19/2016 CLINICAL DATA:  Cough since 01/04/2016 EXAM: CHEST  2 VIEW COMPARISON:  01/12/2016 FINDINGS: There is mild bilateral interstitial thickening. There is no focal parenchymal opacity. There is no pleural effusion or pneumothorax. There is stable cardiomegaly. The osseous structures are unremarkable. IMPRESSION: Cardiomegaly with mild pulmonary vascular congestion. Electronically Signed   By: Kathreen Devoid   On: 01/19/2016 21:21   Dg Chest 2 View  Result Date: 01/12/2016 CLINICAL DATA:  Loss of appetite over the last 3 days, nausea, cough EXAM: CHEST  2 VIEW COMPARISON:  Chest x-ray of 01/06/2016 FINDINGS: Moderate cardiomegaly is stable. Somewhat prominent interstitial markings are noted bilaterally and on the lateral view there may be a small amount of fluid in the fissure, suggesting possible mild interstitial edema. No definite pleural effusion is seen. No bony abnormality is noted. IMPRESSION: Cardiomegaly.  Question mild interstitial edema. Electronically Signed   By: Ivar Drape M.D.   On: 01/12/2016 12:11   Dg Chest 2 View  Result Date: 01/06/2016 CLINICAL DATA:  Cough for 2 weeks since discharge from hospital. Shortness of breath. EXAM: CHEST  2 VIEW COMPARISON:  Chest radiograph 01/01/2016 FINDINGS: Right IJ central venous catheter is been removed. Cardiomediastinal silhouette remains enlarged. No focal airspace consolidation or pulmonary edema. No pneumothorax or sizable pleural effusion. IMPRESSION: No active cardiopulmonary disease. Electronically Signed   By: Ulyses Jarred M.D.   On: 01/06/2016 22:39   Ct Chest High Resolution  Result Date: 01/29/2016 CLINICAL DATA:  50 year old male with history of shortness of breath and cough since being intubated in August 2017. EXAM: CT CHEST WITHOUT CONTRAST TECHNIQUE: Multidetector CT  imaging of the chest was performed following the standard protocol without intravenous contrast. High resolution imaging of the lungs, as well as inspiratory and expiratory imaging, was performed. COMPARISON:  Chest CT 10/02/2014. FINDINGS: Cardiovascular: Heart size is mildly enlarged. There is no significant pericardial fluid, thickening or pericardial calcification. There is aortic atherosclerosis, as well as atherosclerosis of the great vessels of the mediastinum and the coronary arteries, including calcified atherosclerotic plaque in the left main, left anterior descending, left circumflex and right coronary arteries. Calcifications of the mitral annulus. Dilatation of the pulmonic trunk (3.8 cm in diameter). Mediastinum/Nodes: Multiple borderline enlarged and mildly enlarged mediastinal and hilar lymph nodes, similar to the prior examination, measuring up to 15 mm in short axis in the right paratracheal nodal stations. Esophagus is unremarkable in appearance. No axillary lymphadenopathy. Lungs/Pleura: High-resolution images demonstrates some interlobular septal thickening throughout the lungs bilaterally, as well as some very mild patchy ground-glass attenuation. There is a small amount of pleural thickening and/or trace pleural fluid bilaterally. No parenchymal banding, traction bronchiectasis or frank honeycombing  is noted. Inspiratory and expiratory imaging demonstrates some very mild air trapping, indicative of small airways disease. Notably, tearing expiratory phase imaging there is severe collapse of the trachea and mainstem bronchi, indicative of severe tracheobronchomalacia. No acute consolidative airspace disease. Upper Abdomen: Focal low-attenuation in segment 4 of the liver adjacent to the falciform ligament, most compatible with focal hepatic steatosis. Musculoskeletal: There are no aggressive appearing lytic or blastic lesions noted in the visualized portions of the skeleton. IMPRESSION: 1.  Although there is some very mild patchy ground-glass attenuation and some interlobular septal thickening in the lungs, these findings are favored to reflect a background of very mild interstitial pulmonary edema, related to underlying congestive heart failure, rather than being indicative of underlying interstitial lung disease. There also trace bilateral pleural effusions. 2. Dilatation of the pulmonic trunk (3.8 cm in diameter), concerning for underlying pulmonary arterial hypertension. 3. Severe tracheobronchomalacia. In addition, there is mild air trapping indicative of small airways disease. 4. Aortic atherosclerosis, in addition to left main and 3 vessel coronary artery disease. Please note that although the presence of coronary artery calcium documents the presence of coronary artery disease, the severity of this disease and any potential stenosis cannot be assessed on this non-gated CT examination. Assessment for potential risk factor modification, dietary therapy or pharmacologic therapy may be warranted, if clinically indicated. 5. Multiple borderline enlarged and minimally enlarged mediastinal lymph nodes, similar to the prior study, likely chronic in the setting of underlying congestive heart failure. Electronically Signed   By: Vinnie Langton M.D.   On: 01/29/2016 08:41    Wt Readings from Last 3 Encounters:  02/02/16 285 lb 1.6 oz (129.3 kg)  01/29/16 285 lb 9.6 oz (129.5 kg)  01/28/16 286 lb (129.7 kg)    EKG: NSR no acute ST-T changes  Physical Exam: Blood pressure 162/79, pulse 88, temperature 99.2 F (37.3 C), temperature source Oral, resp. rate 16, height 5\' 8"  (1.727 m), weight 285 lb 1.6 oz (129.3 kg), SpO2 100 %. Body mass index is 43.35 kg/m. General: Well developed morbidly obese WM, in no acute distress. Head: Normocephalic, atraumatic, sclera non-icteric, no xanthomas, nares are without discharge.  Neck:  JVD not elevated, thick neck habitus. Lungs: Coarse crackles at  bases, no wheezes or rhonchi. Breathing is unlabored. Heart: RRR with S1 S2. No murmurs, rubs, or gallops appreciated. Abdomen: Soft, non-tender, non-distended with normoactive bowel sounds. No hepatomegaly. No rebound/guarding. No obvious abdominal masses. Msk:  Strength and tone appear normal for age. Extremities: No clubbing or cyanosis. S/p L BKA with prosthesis, RLE with 1+ edema in compression hose.   Neuro: Alert and oriented X 3. No facial asymmetry. No focal deficit. Moves all extremities spontaneously. Psych:  Responds to questions appropriately with a normal affect.   Assessment and Plan   50 y.o. male with history of ESRD on HD (transplant 1995 with failure after 18 yrs), fibromyalgia, chronic anemia requiring recent transfusions, CAD (s/p DES to [facility] 01/2014 and NSTEMI/DES to diagonal (01/01/16), contrast allergy, Type I DM with gastroparesis, HTN, sleep apnea, PAD status post left BKA, tracheobronchomalacia, chronic diastolic CHF, GERD, gout, morbid obesity, hyperlipidemia, inguinal lymphadenopathy, pericardial effusion (not seen on 11/2015 echo) presented with chest pain, tingling in ulnar distribution on left hand, left neck cramp, persistent nausea and poor oral intake, and nightly fevers.  1. Chest pain - persistent for several hours, negative troponin thus far. EKG unchanged. Do not suspect an active acute coronary syndrome, but etiology is unclear at this point.  Could be related to volume overload in the setting of acute on chronic diastolic CHF. Will review further eval with MD. Will continue to cycle troponin this evening and tomorrow.  2. Acute on chronic diastolic CHF - nephrology consult was requested by ED per IM note, may need extra dialysis session. Continue to follow weights.  3. Essential HTN - he is on midodrine indicating prior issues with low BP. Consider titration of metoprolol to 37.5mg  BID if OK with renal.  4. Persistent nausea, vomiting, and nightly fevers - the  patient initially did not report fevers to admitting team. Recent admission corroborated this. I passed this information onto Dyanne Carrel, NP to determine if this changes further plans. Blood cx neg 9/27. Question whether TEE would be helpful. Will start with echocardiogram.  5. Anemia - per IM. CBC in AM.  Signed, Charlie Pitter PA-C 02/02/2016, 2:39 PM Pager: [phone]   I have seen, examined and evaluated the patient this PM along with Dayna Dunn,PA.  After reviewing all the available data and chart, we discussed the patients laboratory, study & physical findings as well as symptoms in detail. I agree with her findings, examination as well as impression recommendations as per our discussion.    Complicated gentleman with multiple comorbidities who does have a history of recent PCI. He now presents with prolonged episode of somewhat atypical sounding chest discomfort symptoms that are not similar to his pre-PCI symptoms. He also has had intermittent almost daily fevers over the last several weeks. He is ruled out for MI, his EKG is relatively stable and normal. Low likelihood of this being cardiac etiology chest pain. I would however recommend transthoracic echo with a minimum to evaluate for possible endocarditis versus pericarditis type pain. May eventually consider TEE if there is high enough index of suspicion.  Would check blood cultures including the HACEK organisms to exclude SBE.  Would not recommend anticoagulation or ischemic evaluation at this time pending results of echocardiogram.    Glenetta Hew, M.D., M.S. Interventional Cardiologist   Pager # [phone] Phone # [phone] [address] [place], [place] [address]

## 2016-02-02 NOTE — Progress Notes (Signed)
Addendum:   Fever: patient reports for last 2 weeks he has been spiking a fever in the evening. He believes he did same during his most recent hospitalization. Chart review confirms frequent fever in evening. Currently afebrile and no leukocytosis. He is non-toxic appearing. Significance unclear.  -will check temperature every evening -monitor.  -no indication antibiotics at this time.      Derhonda Eastlick, np.

## 2016-02-02 NOTE — ED Provider Notes (Signed)
[facility] DEPT Provider Note   CSN: [id number] Arrival date & time: 02/02/16  2992     History   Chief Complaint Chief Complaint  Patient presents with  . Chest Pain    HPI Dale [name] is a 50 y.o. male.  Patient is a 50 year old male with an extensive past medical history of obesity, diabetes, end-stage renal disease on dialysis, coronary artery disease. He presents with chest and left arm pain. He states he was agitated last night during sleep. He was having increased shortness of breath. He used his inhaler with some improvement in symptoms. This morning he started having some discomfort in his left chest with some tingling in his left arm. It also is radiating to his left neck. He describes as tightness. He has nausea which has been chronic for him over the last couple of months. No diaphoresis. He has a chronic cough which is maybe a little bit worse. He currently denies any shortness of breath. He has some chronic mild edema to his extremities but this is unchanged from baseline. He had a complicated admission in August/September. He was admitted for chest and arm pain with shortness of breath. He went into hypoxic respiratory failure on August 28 and was intubated until August 30. He also had an  NSTEMI and a resulting cardiac catheterization with stent placement on September 8.    Chest Pain   Associated symptoms include cough and shortness of breath. Pertinent negatives include no abdominal pain, no back pain, no diaphoresis, no dizziness, no fever, no headaches, no nausea, no numbness, no vomiting and no weakness.    Past Medical History:  Diagnosis Date  . Anemia of chronic disease   . Arthritis    "hands, right knee" (03/19/2014)  . CAD (coronary artery disease)    a. Lexiscan Myoview (11/15):  Inf, apical cap and apical lateral ischemia, EF 56%, inf HK, Moderate Risk;  b. LHC (11/15):  mid to dist Dx 80%, mid RCA 99% (functional CTO), dist RCA 80% >> PCI:  balloon angioplasty to mid RCA (could not deliver stent) - plan staged PCI with HSRA of RCA; Dx to be tx medically >> PCI: Rotoblator atherectomy/DES to [facility]  . Cholecystitis    a. 08/27/2011  . Chronic diastolic CHF (congestive heart failure) ([place])    a. Echo 3/13:  EF 55-60%;  b. Echo (11/15):  Mild LVH, EF 60-65%, no RWMA, Gr 1 DD, MAC, mild LAE, normal RVF, mild RAE;  c.  Echo 5/16:  severe LVH, EF 55-60%, no RWMA, Gr 1 DD, MAC, mild LAE  . Claustrophobia    when things get around his face.   . Difficult intubation    only once in 2015 at [facility] haven't had a problem since then  . ESRD (end stage renal disease) ([place])    a. 1995 s/p cadaveric transplant w/ susbequent failure after 18 yrs;  b.Dialysis initiated 07/2011 [facility] M-W[facility]F  . Fibromyalgia   . GERD (gastroesophageal reflux disease)   . Gout    PMH  . History of blood transfusion   . HLD (hyperlipidemia)   . Hypertension   . Hypothyroidism   . Inguinal lymphadenopathy    a. bilateral - s/p biopsy 07/2011  . Insulin dependent diabetes mellitus (HCC)    Type I  . OSA (obstructive sleep apnea)    adjustable bed  . Pericardial effusion    a.  Small by CT 08/22/11;  b.  Large by CT 08/27/11  .  Pneumonia ~ 2007    Patient Active Problem List   Diagnosis Date Noted  . Chest pain 02/02/2016  . FUO (fever of unknown origin) 01/26/2016  . Nausea & vomiting 01/19/2016  . Hyponatremia 01/19/2016  . Stridor   . Iron deficiency anemia   . Encounter for central line placement   . Gastrointestinal hemorrhage with melena 12/21/2015  . Encounter for intubation   . Acute blood loss anemia   . Uremia   . Acute encephalopathy   . Troponin level elevated   . Pulmonary edema 03/09/2015  . Fluid overload 03/09/2015  . Chronic pain 03/09/2015  . Hyperkalemia 03/09/2015  . Hypoglycemia 03/09/2015  . Diabetes mellitus ([place]) 03/09/2015  . Chest pain at rest   . Accelerated hypertension 02/24/2015  . DM (diabetes mellitus) type II  controlled with renal manifestation ([place]) 02/24/2015  . Acute pulmonary edema ([place]) 01/21/2015  . Acute dyspnea 01/18/2015  . Volume overload 01/18/2015  . HLD (hyperlipidemia) 11/27/2014  . Hypoxia 09/30/2014  . Dizziness 09/30/2014  . Hypotension 09/30/2014  . ESRD on dialysis ([place]) 09/30/2014  . Coronary artery disease involving native coronary artery of native heart with unstable angina pectoris ([place]) 03/20/2014  . Allergic reaction to dye 03/20/2014  . NSTEMI (non-ST elevated myocardial infarction) ([place]) 03/19/2014  . HTN (hypertension) 02/18/2014  . Influenza with other respiratory manifestations 06/17/2013  . Acute respiratory failure with hypoxia ([place]) 06/16/2013  . Tracheobronchitis 06/16/2013  . Salmonella bacteremia 07/10/2012  . Salmonella gastroenteritis 07/10/2012  . Acute respiratory failure ([place]) 07/04/2012  . Septic shock ([place]) 07/04/2012  . Metabolic acidosis 76/72/0947  . Fever and chills 07/03/2012  . Diarrhea 07/03/2012  . Nausea 07/03/2012  . Leukocytosis 07/03/2012  . AKI (acute kidney injury) ([place]) 07/03/2012  . Pain in the chest 06/18/2012  . Swelling of arm 03/19/2012  . Colitis, ischemic ([place]) 08/31/2011  . ESRD (end stage renal disease) ([place]) 08/27/2011  . Abdominal pain 08/27/2011  . Pericardial effusion 08/27/2011  . Constipation, acute 08/23/2011  . History of renal transplant 07/22/2011  . S/P BKA (below knee amputation) ([place]) 07/22/2011  . Obstructive sleep apnea 07/22/2011  . Dyspnea 07/22/2011  . Acute on chronic diastolic heart failure ([place]) 07/22/2011  . Anemia 07/22/2011  . Diabetes mellitus with nephropathy ([place]) 07/22/2011    Past Surgical History:  Procedure Laterality Date  . AMPUTATION OF REPLICATED TOES Right   . ANGIOPLASTY  04/09/2014   RCA   . APPENDECTOMY  ~ 2006  . ARTERIOVENOUS GRAFT PLACEMENT Left 1993?   forearm  . ARTERIOVENOUS GRAFT PLACEMENT Right 1993?   leg  . ARTERIOVENOUS GRAFT PLACEMENT Left    Thigh  .  Arteriovenous Graft Removed Left    Thigh  . AV FISTULA PLACEMENT  07/29/2011   Procedure: ARTERIOVENOUS (AV) FISTULA CREATION;  Surgeon: Mal Misty, MD;  Location: [facility];  Service: Vascular;  Laterality: Right;  Brachial cephalic  . AV FISTULA PLACEMENT Left 01/20/2015   Procedure: LIGATION OF RIGHT ARTERIOVENOUS FISTULA WITH EXCISION OF ANEURYSM;  Surgeon: Angelia Mould, MD;  Location: [facility];  Service: Vascular;  Laterality: Left;  . AV FISTULA PLACEMENT Left 04/30/2015   Procedure: CREATION OF LEFT ARM BRACHIO-CEPHALIC ARTERIOVENOUS (AV) FISTULA ;  Surgeon: Angelia Mould, MD;  Location: [facility];  Service: Vascular;  Laterality: Left;  . AV Graft PLACEMENT Left 1993?   "attempted one in my wrist; didn't take"  . BELOW KNEE LEG AMPUTATION Left 2010  . CARDIAC CATHETERIZATION  03/19/2014  .  CARDIAC CATHETERIZATION  03/19/2014   Procedure: CORONARY BALLOON ANGIOPLASTY;  Surgeon: Burnell Blanks, MD;  Location: [facility] CATH LAB;  Service: Cardiovascular;;  . CARDIAC CATHETERIZATION N/A 01/01/2016   Procedure: Left Heart Cath and Coronary Angiography;  Surgeon: Troy Sine, MD;  Location: [place] CV LAB;  Service: Cardiovascular;  Laterality: N/A;  . CARDIAC CATHETERIZATION N/A 01/01/2016   Procedure: Coronary Stent Intervention;  Surgeon: Troy Sine, MD;  Location: [facility] CV LAB;  Service: Cardiovascular;  Laterality: N/A;  . CARPAL TUNNEL RELEASE Bilateral after 2005  . CATARACT EXTRACTION W/ INTRAOCULAR LENS  IMPLANT, BILATERAL Bilateral 1990's  . COLONOSCOPY  08/29/2011   Procedure: COLONOSCOPY;  Surgeon: Jerene Bears, MD;  Location: [facility];  Service: Gastroenterology;  Laterality: N/A;  . CORONARY ANGIOPLASTY WITH STENT PLACEMENT  04/09/2014       ptca/des mid lad   . ESOPHAGOGASTRODUODENOSCOPY N/A 12/25/2015   Procedure: ESOPHAGOGASTRODUODENOSCOPY (EGD);  Surgeon: Mauri Pole, MD;  Location: [facility] ENDOSCOPY;  Service: Endoscopy;  Laterality: N/A;  bedside    . EYE SURGERY Bilateral    cataract  . FEMORAL ARTERY REPAIR  04/09/2014   ANGIOSEAL  . INSERTION OF DIALYSIS CATHETER  08/01/2011   Procedure: INSERTION OF DIALYSIS CATHETER;  Surgeon: Rosetta Posner, MD;  Location: [facility];  Service: Vascular;  Laterality: Right;  insertion of dialysis catheter on right internal jugular vein  . INSERTION OF DIALYSIS CATHETER Right 01/20/2015   Procedure: INSERTION OF DIALYSIS CATHETER - RIGHT INTERNAL JUGULAR ;  Surgeon: Angelia Mould, MD;  Location: [facility];  Service: Vascular;  Laterality: Right;  . KIDNEY TRANSPLANT  04/25/1993  . LEFT HEART CATHETERIZATION WITH CORONARY ANGIOGRAM N/A 03/19/2014   Procedure: LEFT HEART CATHETERIZATION WITH CORONARY ANGIOGRAM;  Surgeon: Burnell Blanks, MD;  Location: [facility] CATH LAB;  Service: Cardiovascular;  Laterality: N/A;  . LYMPH NODE BIOPSY  08/10/2011   Procedure: LYMPH NODE BIOPSY;  Surgeon: Merrie Roof, MD;  Location: [facility];  Service: General;  Laterality: Left;  left groin lymph biopsy  . NEUROPLASTY / TRANSPOSITION ULNAR NERVE AT ELBOW Left after 2005  . PERCUTANEOUS CORONARY ROTOBLATOR INTERVENTION (PCI-R) N/A 04/09/2014   Procedure: PERCUTANEOUS CORONARY ROTOBLATOR INTERVENTION (PCI-R);  Surgeon: Burnell Blanks, MD;  Location: [facility] CATH LAB;  Service: Cardiovascular;  Laterality: N/A;  . PERIPHERAL VASCULAR CATHETERIZATION N/A 12/25/2014   Procedure: Upper Extremity Venography;  Surgeon: Conrad LaMoure, MD;  Location: [facility] CV LAB;  Service: Cardiovascular;  Laterality: N/A;  . PERITONEAL CATHETER INSERTION    . PERITONEAL CATHETER REMOVAL    . REVISON OF ARTERIOVENOUS FISTULA Right 2/67/1245   Procedure: PLICATION / REVISION OF ARTERIOVENOUS FISTULA;  Surgeon: Angelia Mould, MD;  Location: [facility];  Service: Vascular;  Laterality: Right;  . REVISON OF ARTERIOVENOUS FISTULA Left 09/01/2015   Procedure: SUPERFICIALIZATION OF LEFT ARM BRACHIOCEPHALIC ARTERIOVENOUS FISTULA;  Surgeon:  Angelia Mould, MD;  Location: [facility];  Service: Vascular;  Laterality: Left;  . SHOULDER ARTHROSCOPY Left ~ 2006   frozen  . TOE AMPUTATION Bilateral after 2005  . UMBILICAL HERNIA REPAIR  1990's  . VENOGRAM Right 07/28/2011   Procedure: VENOGRAM;  Surgeon: Conrad Camp Verde, MD;  Location: [facility] CATH LAB;  Service: Cardiovascular;  Laterality: Right;  . VITRECTOMY Bilateral        Home Medications    Prior to Admission medications   Medication Sig Start Date End Date Taking? Authorizing Provider  albuterol (PROVENTIL HFA;VENTOLIN HFA) 108 (  90 Base) MCG/ACT inhaler Inhale 2 puffs into the lungs every 6 (six) hours as needed for wheezing or shortness of breath. 01/26/16  Yes Brand Males, MD  allopurinol (ZYLOPRIM) 100 MG tablet Take 200 mg by mouth daily.  03/14/12  Yes Historical Provider, MD  aspirin EC 81 MG tablet Take 81 mg by mouth at bedtime.   Yes Historical Provider, MD  atorvastatin (LIPITOR) 80 MG tablet Take 1 tablet (80 mg total) by mouth daily at 6 PM. 01/04/16  Yes Florencia Reasons, MD  benzonatate (TESSALON) 100 MG capsule Take 1 capsule (100 mg total) by mouth 2 (two) times daily as needed for cough. 01/26/16  Yes Brand Males, MD  calcium acetate (PHOSLO) 667 MG capsule Take 1,334-2,001 mg by mouth See admin instructions. Take 3 capsules (2001 mg) by mouth with meals and 2 capsule (1334mg  ) with snacks 09/19/12  Yes Historical Provider, MD  diphenhydrAMINE (BENADRYL) 25 mg capsule Take 1-2 capsules (25-50 mg total) by mouth every 6 (six) hours as needed for itching. Patient taking differently: Take 50 mg by mouth 2 (two) times daily as needed for itching or sleep.  04/11/14  Yes Rhonda G Barrett, PA-C  fenofibrate (TRICOR) 48 MG tablet Take 1 tablet (48 mg total) by mouth daily. 02/27/15  Yes Shanker Kristeen Mans, MD  Ferric Citrate (AURYXIA) 1 GM 210 MG(Fe) TABS Take 2 g by mouth 3 (three) times daily with meals.   Yes Historical Provider, MD  fluticasone (FLONASE) 50 MCG/ACT nasal  spray Place 1 spray into both nostrils daily.   Yes Historical Provider, MD  gabapentin (NEURONTIN) 300 MG capsule Take 300 mg by mouth 3 (three) times daily.   Yes Historical Provider, MD  HYDROmorphone (DILAUDID) 2 MG tablet Take 1 tablet (2 mg total) by mouth 3 (three) times daily as needed for severe pain. Patient taking differently: Take 2 mg by mouth 2 (two) times daily. Take 1 tablet (2 mg) by mouth daily at bedtime, may take an additional dose during the day as needed for severe pain 11/20/14  Yes Alvia Grove, PA-C  Insulin Human (INSULIN PUMP) 100 unit/ml SOLN Inject 1 each into the skin continuous. Humalog   Yes Historical Provider, MD  insulin lispro (HUMALOG) 100 UNIT/ML injection Inject 1-175 Units into the skin continuous. Up to 175 units continuous in pump,  Basal rates area as follows:  2400 1.5 units/hr, 0300 1.9 units/hr, 0800 1.45 units/hr, 1200 1.625 units/hr   Yes Historical Provider, MD  levothyroxine (SYNTHROID, LEVOTHROID) 100 MCG tablet Take 100 mcg by mouth at bedtime.    Yes Historical Provider, MD  metoCLOPramide (REGLAN) 5 MG tablet Take 2 tablets (10 mg total) by mouth 4 (four) times daily - after meals and at bedtime. 01/21/16  Yes Velvet Bathe, MD  metoprolol tartrate (LOPRESSOR) 25 MG tablet Take 1 tablet (25 mg total) by mouth 2 (two) times daily. 01/28/16  Yes Eileen Stanford, PA-C  midodrine (PROAMATINE) 10 MG tablet Take 20 mg by mouth every Monday, Wednesday, and Friday. On dialysis days   Yes Historical Provider, MD  multivitamin (RENA-VIT) TABS tablet Take 1 tablet by mouth daily.   Yes Historical Provider, MD  Nutritional Supplements (FEEDING SUPPLEMENT, NEPRO CARB STEADY,) LIQD Take 237 mLs by mouth daily. 01/04/16  Yes Florencia Reasons, MD  pantoprazole (PROTONIX) 40 MG tablet Take 40 mg by mouth daily.  01/14/16  Yes Historical Provider, MD  Polyethyl Glycol-Propyl Glycol (SYSTANE) 0.4-0.3 % SOLN Place 1 drop into both eyes as  needed (dry eyes).   Yes Historical  Provider, MD  prasugrel (EFFIENT) 10 MG TABS tablet Take 1 tablet (10 mg total) by mouth daily. 01/28/16 04/27/16 Yes Eileen Stanford, PA-C  promethazine (PHENERGAN) 25 MG tablet Take 1 tablet (25 mg total) by mouth every 6 (six) hours as needed for nausea or vomiting. For nausea 11/27/14  Yes Liliane Shi, PA-C  rOPINIRole (REQUIP) 2 MG tablet Take 2 mg by mouth daily with supper.    Yes Historical Provider, MD  GLUCAGON EMERGENCY 1 MG injection Inject 1 mg into the muscle daily as needed (low blood sugar).  01/19/12   Historical Provider, MD  nitroGLYCERIN (NITROSTAT) 0.4 MG SL tablet Place 0.4 mg under the tongue every 5 (five) minutes as needed for chest pain.     [name], MD    Family History Family History  Problem Relation Age of Onset  . Colon polyps Father   . Diabetes Father   . Hypertension Father   . Hypertension Mother   . Diabetes Mother   . Hyperlipidemia Mother   . Breast cancer Maternal Aunt   . Cancer Maternal Aunt     Breast and Bone  . Malignant hyperthermia Neg Hx   . Heart attack Neg Hx   . Stroke Neg Hx     Social History Social History  Substance Use Topics  . Smoking status: Never Smoker  . Smokeless tobacco: Never Used  . Alcohol use Yes     Allergies   Contrast media [iodinated diagnostic agents]; Rocephin [ceftriaxone]; Amoxicillin; Buprenorphine hcl; Clopidogrel; Hydrocodone; Morphine; Adhesive [tape]; Prednisone; Ciprofloxacin; Clindamycin/lincomycin; Codeine; Doxycycline; Enalapril; Levofloxacin; Mobic [meloxicam]; Oxycodone; and Vasotec   Review of Systems Review of Systems  Constitutional: Positive for fatigue. Negative for chills, diaphoresis and fever.  HENT: Negative for congestion, rhinorrhea and sneezing.   Eyes: Negative.   Respiratory: Positive for cough and shortness of breath. Negative for chest tightness.   Cardiovascular: Positive for chest pain. Negative for leg swelling.  Gastrointestinal: Negative for abdominal pain,  blood in stool, diarrhea, nausea and vomiting.  Genitourinary: Negative for difficulty urinating, flank pain, frequency and hematuria.  Musculoskeletal: Positive for neck pain. Negative for arthralgias and back pain.  Skin: Negative for rash.  Neurological: Negative for dizziness, speech difficulty, weakness, numbness and headaches.     Physical Exam Updated Vital Signs BP 160/68   Pulse 88   Temp 98.2 F (36.8 C) (Oral)   Resp (!) 28   Ht 5\' 8"  (1.727 m)   Wt 275 lb (124.7 kg)   SpO2 100%   BMI 41.81 kg/m   Physical Exam  Constitutional: He is oriented to person, place, and time. He appears well-developed and well-nourished.  HENT:  Head: Normocephalic and atraumatic.  Eyes: Pupils are equal, round, and reactive to light.  Neck: Normal range of motion. Neck supple.  Cardiovascular: Normal rate, regular rhythm and normal heart sounds.   Pulmonary/Chest: Effort normal and breath sounds normal. No respiratory distress. He has no wheezes. He has no rales. He exhibits no tenderness.  Frequent cough  Abdominal: Soft. Bowel sounds are normal. There is no tenderness. There is no rebound and no guarding.  Musculoskeletal: Normal range of motion. He exhibits edema (Mild edema to the right lower extremity, he is status post left BKA).  Lymphadenopathy:    He has no cervical adenopathy.  Neurological: He is alert and oriented to person, place, and time.  Skin: Skin is warm and dry. No rash noted.  Psychiatric: He has a normal mood and affect.     ED Treatments / Results  Labs (all labs ordered are listed, but only abnormal results are displayed) Labs Reviewed  BASIC METABOLIC PANEL - Abnormal; Notable for the following:       Result Value   Sodium 134 (*)    Chloride 94 (*)    Glucose, Bld 132 (*)    Creatinine, Ser 5.98 (*)    Calcium 8.4 (*)    GFR calc non Af Amer 10 (*)    GFR calc Af Amer 11 (*)    All other components within normal limits  CBC WITH  DIFFERENTIAL/PLATELET - Abnormal; Notable for the following:    RBC 2.75 (*)    Hemoglobin 7.9 (*)    HCT 26.4 (*)    MCHC 29.9 (*)    RDW 16.7 (*)    All other components within normal limits  BRAIN NATRIURETIC PEPTIDE - Abnormal; Notable for the following:    B Natriuretic Peptide 1,185.6 (*)    All other components within normal limits  CBG MONITORING, ED - Abnormal; Notable for the following:    Glucose-Capillary 142 (*)    All other components within normal limits  I-STAT TROPOININ, ED    EKG  EKG Interpretation  Date/Time:  Tuesday February 02 2016 08:21:50 EDT Ventricular Rate:  87 PR Interval:  128 QRS Duration: 82 QT Interval:  400 QTC Calculation: 481 R Axis:   82 Text Interpretation:  Normal sinus rhythm Prolonged QT Abnormal ECG since last tracing no significant change Confirmed by Janyia Guion  MD, Georgeann Brinkman ([id number]) on 02/02/2016 8:53:14 AM       Radiology Dg Chest 2 View  Result Date: 02/02/2016 CLINICAL DATA:  Cough and shortness of breath for several hours EXAM: CHEST  2 VIEW COMPARISON:  01/28/2016 FINDINGS: Cardiac shadow remains enlarged. Mild central vascular congestion is noted without significant interstitial edema. No focal infiltrate is seen. Small bilateral pleural effusions are noted. No acute bony abnormality is seen. IMPRESSION: Mild vascular congestion and small pleural effusions. These have increased in the interval from the prior exam. Electronically Signed   By: Inez Catalina M.D.   On: 02/02/2016 09:50    Procedures Procedures (including critical care time)  Medications Ordered in ED Medications  nitroGLYCERIN (NITROSTAT) SL tablet 0.4 mg (0.4 mg Sublingual Given 02/02/16 1058)  metoprolol tartrate (LOPRESSOR) tablet 25 mg (not administered)  albuterol (PROVENTIL HFA;VENTOLIN HFA) 108 (90 Base) MCG/ACT inhaler 2 puff (not administered)  benzonatate (TESSALON) capsule 100 mg (not administered)  pantoprazole (PROTONIX) EC tablet 40 mg (not  administered)  metoCLOPramide (REGLAN) tablet 10 mg (not administered)  atorvastatin (LIPITOR) tablet 80 mg (not administered)  Ferric Citrate TABS 2 g (not administered)  gabapentin (NEURONTIN) capsule 300 mg (not administered)  insulin lispro (HUMALOG) injection 1-175 Units (not administered)  midodrine (PROAMATINE) tablet 20 mg (not administered)  aspirin EC tablet 81 mg (not administered)  fenofibrate tablet 54 mg (not administered)  promethazine (PHENERGAN) tablet 25 mg (not administered)  HYDROmorphone (DILAUDID) tablet 2 mg (not administered)  multivitamin (RENA-VIT) tablet 1 tablet (not administered)  diphenhydrAMINE (BENADRYL) capsule 50 mg (not administered)  levothyroxine (SYNTHROID, LEVOTHROID) tablet 100 mcg (not administered)  rOPINIRole (REQUIP) tablet 2 mg (not administered)  calcium acetate (PHOSLO) capsule 1,334-2,001 mg (not administered)  allopurinol (ZYLOPRIM) tablet 200 mg (not administered)  insulin pump 1 each (not administered)  acetaminophen (TYLENOL) tablet 650 mg (not administered)  ondansetron (ZOFRAN) injection 4 mg (not administered)  enoxaparin (LOVENOX) injection 40 mg (not administered)  gi cocktail (Maalox,Lidocaine,Donnatal) (not administered)     Initial Impression / Assessment and Plan / ED Course  I have reviewed the triage vital signs and the nursing notes.  Pertinent labs & imaging results that were available during my care of the patient were reviewed by me and considered in my medical decision making (see chart for details).  Clinical Course    Patient presents with chest pain radiating to his neck and left arm with associated shortness of breath. He has evidence of some mild fluid overload. He does not produce urine some Lasix was not given. He was given nitroglycerin. He's currently chest pain-free. He feels that his breathing is better. His oxygen saturations are normal on a nasal cannula 2 L. I spoke with cardiology who will consult on  the patient but they request hospitalist admission. I spoke with Dyanne Carrel with the hospitalist service who will admit the patient under Dr. Marily Memos.  I also notified Dr. Lorrene Reid with nephrology that pt was being admitted.  Final Clinical Impressions(s) / ED Diagnoses   Final diagnoses:  Chest pain, unspecified type  Systolic congestive heart failure, unspecified congestive heart failure chronicity ([place])    New Prescriptions New Prescriptions   No medications on file     Malvin Johns, MD 02/02/16 1330

## 2016-02-02 NOTE — ED Triage Notes (Signed)
Pt sts left sided chest pressure into left arm and neck; pt dialysis pt with last treatment yesterday

## 2016-02-03 ENCOUNTER — Observation Stay (HOSPITAL_COMMUNITY): Payer: Medicare Other

## 2016-02-03 ENCOUNTER — Observation Stay (HOSPITAL_BASED_OUTPATIENT_CLINIC_OR_DEPARTMENT_OTHER): Payer: Medicare Other

## 2016-02-03 DIAGNOSIS — R079 Chest pain, unspecified: Secondary | ICD-10-CM

## 2016-02-03 DIAGNOSIS — D638 Anemia in other chronic diseases classified elsewhere: Secondary | ICD-10-CM | POA: Diagnosis not present

## 2016-02-03 DIAGNOSIS — L03311 Cellulitis of abdominal wall: Secondary | ICD-10-CM | POA: Diagnosis not present

## 2016-02-03 DIAGNOSIS — I5033 Acute on chronic diastolic (congestive) heart failure: Secondary | ICD-10-CM | POA: Diagnosis not present

## 2016-02-03 LAB — IRON AND TIBC
Iron: 26 ug/dL — ABNORMAL LOW (ref 45–182)
Saturation Ratios: 20 % (ref 17.9–39.5)
TIBC: 132 ug/dL — ABNORMAL LOW (ref 250–450)
UIBC: 106 ug/dL

## 2016-02-03 LAB — CBC
HCT: 24.5 % — ABNORMAL LOW (ref 39.0–52.0)
Hemoglobin: 7.4 g/dL — ABNORMAL LOW (ref 13.0–17.0)
MCH: 28.8 pg (ref 26.0–34.0)
MCHC: 30.2 g/dL (ref 30.0–36.0)
MCV: 95.3 fL (ref 78.0–100.0)
Platelets: 343 10*3/uL (ref 150–400)
RBC: 2.57 MIL/uL — ABNORMAL LOW (ref 4.22–5.81)
RDW: 16.9 % — ABNORMAL HIGH (ref 11.5–15.5)
WBC: 5.7 10*3/uL (ref 4.0–10.5)

## 2016-02-03 LAB — TROPONIN I: Troponin I: 0.04 ng/mL (ref <0.03)

## 2016-02-03 LAB — ECHOCARDIOGRAM COMPLETE
Height: 68 in
Weight: 4561.6 oz

## 2016-02-03 LAB — GLUCOSE, CAPILLARY
Glucose-Capillary: 135 mg/dL — ABNORMAL HIGH (ref 65–99)
Glucose-Capillary: 111 mg/dL — ABNORMAL HIGH (ref 65–99)
Glucose-Capillary: 233 mg/dL — ABNORMAL HIGH (ref 65–99)

## 2016-02-03 LAB — HEMOGLOBIN A1C
Hgb A1c MFr Bld: 5.8 % — ABNORMAL HIGH (ref 4.8–5.6)
Mean Plasma Glucose: 120 mg/dL

## 2016-02-03 LAB — FERRITIN: Ferritin: 1022 ng/mL — ABNORMAL HIGH (ref 24–336)

## 2016-02-03 MED ORDER — VANCOMYCIN HCL 10 G IV SOLR
2500.0000 mg | Freq: Once | INTRAVENOUS | Status: DC
  Filled 2016-02-03: qty 2500

## 2016-02-03 MED ORDER — HYDROMORPHONE HCL 1 MG/ML IJ SOLN
INTRAMUSCULAR | Status: AC
  Filled 2016-02-03: qty 1

## 2016-02-03 MED ORDER — VANCOMYCIN HCL IN DEXTROSE 1-5 GM/200ML-% IV SOLN
1000.0000 mg | INTRAVENOUS | Status: DC
  Administered 2016-02-05: 1000 mg via INTRAVENOUS
  Filled 2016-02-03: qty 200

## 2016-02-03 MED ORDER — VANCOMYCIN HCL 1000 MG IV SOLR
2500.0000 mg | Freq: Once | INTRAVENOUS | Status: AC
  Administered 2016-02-03: 2500 mg via INTRAVENOUS
  Filled 2016-02-03 (×2): qty 2500

## 2016-02-03 MED ORDER — DOXERCALCIFEROL 4 MCG/2ML IV SOLN
INTRAVENOUS | Status: AC
  Filled 2016-02-03: qty 2

## 2016-02-03 MED ORDER — DARBEPOETIN ALFA 200 MCG/0.4ML IJ SOSY
PREFILLED_SYRINGE | INTRAMUSCULAR | Status: AC
  Filled 2016-02-03: qty 0.4

## 2016-02-03 MED ORDER — DOXERCALCIFEROL 4 MCG/2ML IV SOLN
3.0000 ug | INTRAVENOUS | Status: DC
  Administered 2016-02-03: 3 ug via INTRAVENOUS
  Filled 2016-02-03 (×2): qty 2

## 2016-02-03 MED ORDER — PERFLUTREN LIPID MICROSPHERE
INTRAVENOUS | Status: AC
  Administered 2016-02-03: 2 mL via INTRAVENOUS
  Filled 2016-02-03: qty 10

## 2016-02-03 MED ORDER — PERFLUTREN LIPID MICROSPHERE
1.0000 mL | INTRAVENOUS | Status: AC | PRN
  Administered 2016-02-03: 2 mL via INTRAVENOUS
  Filled 2016-02-03: qty 10

## 2016-02-03 MED ORDER — SODIUM CHLORIDE 0.9 % IV SOLN
62.5000 mg | INTRAVENOUS | Status: DC
  Administered 2016-02-03: 62.5 mg via INTRAVENOUS
  Filled 2016-02-03 (×3): qty 5

## 2016-02-03 MED ORDER — DARBEPOETIN ALFA 200 MCG/0.4ML IJ SOSY
200.0000 ug | PREFILLED_SYRINGE | INTRAMUSCULAR | Status: DC
  Administered 2016-02-03: 200 ug via INTRAVENOUS
  Filled 2016-02-03: qty 0.4

## 2016-02-03 NOTE — Progress Notes (Signed)
*  PRELIMINARY RESULTS* Echocardiogram 2D Echocardiogram has been performed.  Sherrie Sport 02/03/2016, 12:33 PM

## 2016-02-03 NOTE — Progress Notes (Signed)
[facility] Rounding Note  Subjective/Interval History:   Waiting for dialysis Still coughing and with SOB Desats during sleep Complains of abdominal wall pain on the right and has redness and tenderness there - just started during the night   Objective Vital signs in last 24 hours: Vitals:   02/02/16 2016 02/03/16 0059 02/03/16 0500 02/03/16 0734  BP: (!) 155/77 135/66 136/61   Pulse: 92  80   Resp: 18 19 (!) 26   Temp: 98.9 F (37.2 C) 98.3 F (36.8 C) 98.5 F (36.9 C)   TempSrc: Oral Oral Oral   SpO2:  92% 100% 97%  Weight:      Height:       Physical Exam:  Blood pressure 136/61, pulse 80, temperature 98.5 F (36.9 C), temperature source Oral, resp. rate (!) 26, height 5\' 8"  (1.727 m), weight 129.3 kg (285 lb 1.6 oz), SpO2 97 %. Very nice MO WM Sitting on the edge of the bed Fine crackles both lung bases Cardiac rhythm regular Normal S1S2 No S3 Large abd/huge pannus with pitting edema of the entire abd wall  Area far right lateral abdominal wall with redness and tenderness with the appearance of a low grade cellulitus RLE with woody/pitting edema Wearing prosthesis L stump  Labs:  Recent Labs Lab 01/29/16 1301 02/02/16 0930  NA 136 134*  K 3.3* 3.5  CL 95* 94*  CO2 34* 30  GLUCOSE 124* 132*  BUN 6 14  CREATININE 3.32* 5.98*  CALCIUM 8.1* 8.4*    Recent Labs Lab 01/29/16 1301 02/02/16 0930 02/03/16 0227  WBC 5.1 5.3 5.7  NEUTROABS  --  3.4  --   HGB 8.4 Repeated and verified X2.* 7.9* 7.4*  HCT 25.8 Repeated and verified X2.* 26.4* 24.5*  MCV 90.1 96.0 95.3  PLT 372.0 375 343    Recent Labs Lab 02/02/16 1823 02/03/16 0227  TROPONINI 0.04* 0.04*     Recent Labs Lab 02/02/16 0826 02/02/16 1637 02/02/16 2113 02/03/16 0749  GLUCAP 142* 202* 281* 135*    Studies/Results: Dg Chest 2 View  Result Date: 02/02/2016 CLINICAL DATA:  Cough and shortness of breath for several hours EXAM: CHEST  2 VIEW COMPARISON:  01/28/2016 FINDINGS: Cardiac  shadow remains enlarged. Mild central vascular congestion is noted without significant interstitial edema. No focal infiltrate is seen. Small bilateral pleural effusions are noted. No acute bony abnormality is seen. IMPRESSION: Mild vascular congestion and small pleural effusions. These have increased in the interval from the prior exam. Electronically Signed   By: Inez Catalina M.D.   On: 02/02/2016 09:50   Medications:   . albuterol  2.5 mg Nebulization TID  . allopurinol  200 mg Oral Daily  . aspirin EC  81 mg Oral QHS  . atorvastatin  80 mg Oral q1800  . calcium acetate  2,001 mg Oral TID WC  . enoxaparin (LOVENOX) injection  40 mg Subcutaneous Q24H  . fenofibrate  54 mg Oral Daily  . Ferric Citrate  2 g Oral TID WC  . gabapentin  300 mg Oral TID  . insulin aspart  0-5 Units Subcutaneous QHS  . insulin aspart  0-9 Units Subcutaneous TID WC  . insulin glargine  7 Units Subcutaneous QHS  . levothyroxine  100 mcg Oral QHS  . metoCLOPramide  5 mg Oral TID PC & HS  . metoprolol tartrate  25 mg Oral BID  . midodrine  20 mg Oral Q M,W,F  . multivitamin  1 tablet Oral QHS  .  pantoprazole  40 mg Oral Daily  . rOPINIRole  2 mg Oral Q supper     Dialysis Orders:  Center: MWF at [facility] unit 4:15 hours,  EDW 126kg, will need lower EDW  2K/2.25Ca bath,  BFR 350/DFR800,  AVF - Hectoral 39mcg IV q HD - Venofer 50mg  IV weekly - Mircera 282mcg IV q 2 weeks (last given 9/13)  Assessment/Plan: 1. Chest pain: Cardiac enzymes stable; being trended. Echo has been ordered to evaluate for possible endocarditis given recurrent fevers. 2. Dyspnea/Volume overload: CXR with vascular congestion and clinically with volume excess. Will plan to reduce EDW with dialysis. I think needs very substantial lowering of EDW 3. Nausea/vomiting: ? gastroparesis. 4. ESRD: MWF schedule. For HD today. Will try for 3.5L UF. May need extra HD to fully correct volume. 5. Hypertension/volume: As above. Continue meds and will  work on volume correction with HD. 6. Anemia: Hgb 7.9. Starting Aranesp 217mcg IV weekly. Will check iron studies. 7. Metabolic bone disease: Ca 8.4. Continue binders/VRDA. Will check Phos 8. Abdominal wall cellulitus - developed pain during the night, now with redness and tenderness. Multiple drug allergies. Get BC's and ask pharmacy to dose vancomycin. Wonder if this has been brewing and could be responsible for his low grade fevers at home    [name], MD [facility] [phone] pager 02/03/2016, 10:54 AM

## 2016-02-03 NOTE — Progress Notes (Signed)
PROGRESS NOTE    Dale [name]  DVV:[id number] DOB: 05-01-1965 DOA: 02/02/2016 PCP: [name], MD   Outpatient Specialists:     Brief Narrative:  Dale [name] is a 50 y.o. male who presents with acute on chronic congestive heart failure secondary to fluid overload from ESRD on dialysis. Patient also with chest pain which is likely secondary to underlying pulmonary congestion. Doubt true ACS at this time but will rule out. Patient also c/o chronic N/V as well as redness on right flank   Assessment & Plan:   Principal Problem:   Chest pain at rest Active Problems:   Obstructive sleep apnea   Acute on chronic diastolic heart failure (HCC)   Pericardial effusion   Nausea   HTN (hypertension)   Coronary artery disease involving native coronary artery of native heart with unstable angina pectoris (HCC)   ESRD on dialysis (HCC)   Chronic pain   Diabetes mellitus (HCC)   FUO (fever of unknown origin)   Chest pain   Anemia of chronic disease   Diabetes mellitus with complication (HCC)   Chest pain at rest. CE stable Echo done and shows diastolic CHF  ?cellulitis -CT abd/pelvis pending -vanc started per nephrology   Acute on chronic diastolic heart failure. -dialysis today   End-stage renal disease on dialysis. Monday Wednesday Friday schedule -Nephrology consult -Continue home meds  Anemia of chronic disease. Hemoglobin 7.9. Chart review indicates 2 weeks ago hemoglobin 9.1. Of note he underwent EGD last hospitalization revealing Normal esophagus. - Erythematous mucosa in the cardia, gastric fundus and gastric body. Normal examined duodenum. - Did not identify any high risk lesion on upper endoscopy to cause possible brisk upper GI blood loss  No specimens collected  transfusion per nephrology  Diabetes. Patient is on insulin pump at home. Serum glucose 132 on admission. Patient reports history of hypoglycemic events when in the hospital and on his pump  request discontinuation of his pump -Obtain a hemoglobin A1c -Sliding scale insulin for optimal control -Low-dose Lantus -Monitor   Hypertension. Fair control in the emergency department. -Continue home beta blocker -Monitor   Chronic pain. He is on oral Dilaudid but requesting IV Dilaudid as his " fibromyalgia pain is much worse when he is in the hospital".  -Continue gabapentin - low dose IV Dilaudid to be discontinued quickly   Persistent nausea vomiting -Wife reports follow-up appointment with GI at the end of the week. This is been going on for several weeks. Home medications include Reglan, Phenergan -Patient requesting food on admission  Obstructive sleep apnea. He says he was scheduled for an evaluation next week.  Morbid Obesity. BMI 41.9. -Nutritional consult   CAD. Recent non-STEMI with positive troponin. Heart cath with DES on September 8.   DVT prophylaxis:  Lovenox  Code Status: Full Code   Family Communication: patient  Disposition Plan:  Home once work up complete   Consultants:   Cards  nephro    Subjective: Multiple complaints he wants addressed here  Objective: Vitals:   02/03/16 0734 02/03/16 1100 02/03/16 1322 02/03/16 1330  BP:  (!) 146/87 (!) 163/71 (!) 150/64  Pulse:  85 87 85  Resp:  (!) 26 (!) 21 20  Temp:      TempSrc:      SpO2: 97% 100% 100%   Weight:      Height:       No intake or output data in the 24 hours ending 02/03/16 [place]  02/02/16 1102 02/02/16 1431  Weight: 124.7 kg (275 lb) 129.3 kg (285 lb 1.6 oz)    Examination:  General exam: Appears calm and comfortable  Respiratory system: Clear to auscultation. Respiratory effort normal. Cardiovascular system: S1 & S2 heard, RRR. No JVD, murmurs, rubs, gallops or clicks. No pedal edema. Gastrointestinal system: +BS, multiple scars, morbidly obese Central nervous system: Alert and oriented. No focal neurological deficits. Extremities:  Symmetric 5 x 5 power. Skin: faint redness on right flank Psychiatry: Judgement and insight appear normal. Mood & affect appropriate.     Data Reviewed: I have personally reviewed following labs and imaging studies  CBC:  Recent Labs Lab 01/29/16 1301 02/02/16 0930 02/03/16 0227  WBC 5.1 5.3 5.7  NEUTROABS  --  3.4  --   HGB 8.4 Repeated and verified X2.* 7.9* 7.4*  HCT 25.8 Repeated and verified X2.* 26.4* 24.5*  MCV 90.1 96.0 95.3  PLT 372.0 375 938   Basic Metabolic Panel:  Recent Labs Lab 01/29/16 1301 02/02/16 0930  NA 136 134*  K 3.3* 3.5  CL 95* 94*  CO2 34* 30  GLUCOSE 124* 132*  BUN 6 14  CREATININE 3.32* 5.98*  CALCIUM 8.1* 8.4*   GFR: Estimated Creatinine Clearance: 19.4 mL/min (by C-G formula based on SCr of 5.98 mg/dL (H)). Liver Function Tests: No results for input(s): AST, ALT, ALKPHOS, BILITOT, PROT, ALBUMIN in the last 168 hours. No results for input(s): LIPASE, AMYLASE in the last 168 hours. No results for input(s): AMMONIA in the last 168 hours. Coagulation Profile: No results for input(s): INR, PROTIME in the last 168 hours. Cardiac Enzymes:  Recent Labs Lab 02/02/16 1823 02/03/16 0227  TROPONINI 0.04* 0.04*   BNP (last 3 results) No results for input(s): PROBNP in the last 8760 hours. HbA1C:  Recent Labs  02/02/16 1535  HGBA1C 5.8*   CBG:  Recent Labs Lab 02/02/16 0826 02/02/16 1637 02/02/16 2113 02/03/16 0749 02/03/16 1126  GLUCAP 142* 202* 281* 135* 111*   Lipid Profile: No results for input(s): CHOL, HDL, LDLCALC, TRIG, CHOLHDL, LDLDIRECT in the last 72 hours. Thyroid Function Tests:  Recent Labs  02/02/16 1535  TSH 4.840*   Anemia Panel: No results for input(s): VITAMINB12, FOLATE, FERRITIN, TIBC, IRON, RETICCTPCT in the last 72 hours. Urine analysis:    Component Value Date/Time   COLORURINE YELLOW 06/16/2013 2059   APPEARANCEUR CLEAR 06/16/2013 2059   LABSPEC 1.018 06/16/2013 2059   PHURINE 5.5  06/16/2013 2059   GLUCOSEU NEGATIVE 06/16/2013 2059   HGBUR SMALL (A) 06/16/2013 2059   BILIRUBINUR NEGATIVE 06/16/2013 2059   [place] NEGATIVE 06/16/2013 2059   PROTEINUR 100 (A) 06/16/2013 2059   UROBILINOGEN 0.2 06/16/2013 2059   NITRITE NEGATIVE 06/16/2013 2059   LEUKOCYTESUR NEGATIVE 06/16/2013 2059     )No results found for this or any previous visit (from the past 240 hour(s)).    Anti-infectives    Start     Dose/Rate Route Frequency Ordered Stop   02/05/16 1200  vancomycin (VANCOCIN) IVPB 1000 mg/200 mL premix     1,000 mg 200 mL/hr over 60 Minutes Intravenous Every M-W-F (Hemodialysis) 02/03/16 1159     02/03/16 1300  vancomycin (VANCOCIN) 2,500 mg in sodium chloride 0.9 % 250 mL IVPB     2,500 mg 250 mL/hr over 60 Minutes Intravenous  Once 02/03/16 1159     02/03/16 1200  vancomycin (VANCOCIN) 2,500 mg in sodium chloride 0.9 % 500 mL IVPB  Status:  Discontinued  2,500 mg 250 mL/hr over 120 Minutes Intravenous  Once 02/03/16 1120 02/03/16 1159       Radiology Studies: Dg Chest 2 View  Result Date: 02/02/2016 CLINICAL DATA:  Cough and shortness of breath for several hours EXAM: CHEST  2 VIEW COMPARISON:  01/28/2016 FINDINGS: Cardiac shadow remains enlarged. Mild central vascular congestion is noted without significant interstitial edema. No focal infiltrate is seen. Small bilateral pleural effusions are noted. No acute bony abnormality is seen. IMPRESSION: Mild vascular congestion and small pleural effusions. These have increased in the interval from the prior exam. Electronically Signed   By: Inez Catalina M.D.   On: 02/02/2016 09:50        Scheduled Meds: . albuterol  2.5 mg Nebulization TID  . allopurinol  200 mg Oral Daily  . aspirin EC  81 mg Oral QHS  . atorvastatin  80 mg Oral q1800  . calcium acetate  2,001 mg Oral TID WC  . darbepoetin (ARANESP) injection - DIALYSIS  200 mcg Intravenous Q Wed-HD  . doxercalciferol  3 mcg Intravenous Q M,W,F-HD  .  enoxaparin (LOVENOX) injection  40 mg Subcutaneous Q24H  . fenofibrate  54 mg Oral Daily  . Ferric Citrate  2 g Oral TID WC  . ferric gluconate (FERRLECIT/NULECIT) IV  62.5 mg Intravenous Q Wed-HD  . gabapentin  300 mg Oral TID  . insulin aspart  0-5 Units Subcutaneous QHS  . insulin aspart  0-9 Units Subcutaneous TID WC  . insulin glargine  7 Units Subcutaneous QHS  . levothyroxine  100 mcg Oral QHS  . metoCLOPramide  5 mg Oral TID PC & HS  . metoprolol tartrate  25 mg Oral BID  . midodrine  20 mg Oral Q M,W,F  . multivitamin  1 tablet Oral QHS  . pantoprazole  40 mg Oral Daily  . rOPINIRole  2 mg Oral Q supper  . vancomycin  2,500 mg Intravenous Once  . [START ON 02/05/2016] vancomycin  1,000 mg Intravenous Q M,W,F-HD   Continuous Infusions:    LOS: 0 days    Time spent: 35 min    [place], DO [facility] Pager [phone]  If 7PM-7AM, please contact night-coverage [email or web address] Password [email or web address] 02/03/2016, 1:41 PM

## 2016-02-03 NOTE — Care Management Obs Status (Signed)
[place] NOTIFICATION   Patient Details  Name: Dale [name] MRN: [id number] Date of Birth: 10-11-1965   Medicare Observation Status Notification Given:  Yes    Bethena Roys, RN 02/03/2016, 11:12 AM

## 2016-02-03 NOTE — Progress Notes (Signed)
Patient Name: Dale [name] Date of Encounter: 02/03/2016  Hospital Problem List     Principal Problem:   Chest pain at rest Active Problems:   Obstructive sleep apnea   Acute on chronic diastolic heart failure (HCC)   Pericardial effusion   Nausea   HTN (hypertension)   Coronary artery disease involving native coronary artery of native heart with unstable angina pectoris (HCC)   ESRD on dialysis (HCC)   Chronic pain   Diabetes mellitus (HCC)   FUO (fever of unknown origin)   Chest pain   Anemia of chronic disease   Diabetes mellitus with complication [facility])    Patient Profile     Dale [name] is a 50 y.o. male with history of ESRD on HD .  He has CAD with cath on 01/01/16 which showed 2V obstructive coronary disease with mild calcification of the proximal LAD with 30% narrowing, progressive 95% mid diagonal stenosis, 30% mid LAD narrowing; normal left circumflex coronary artery; and calcified large RCA with a widely patent stent in the mid RCA at the site of previous atherectomy, 30% acute margin narrowing, and 50% inferior branch PDA stenosis.He underwent successful PCI/DES to diagonal vessel.  We are called to see him for chest pain.   Subjective   SOB.  No current chest pain.   Inpatient Medications    . albuterol  2.5 mg Nebulization TID  . allopurinol  200 mg Oral Daily  . aspirin EC  81 mg Oral QHS  . atorvastatin  80 mg Oral q1800  . calcium acetate  2,001 mg Oral TID WC  . enoxaparin (LOVENOX) injection  40 mg Subcutaneous Q24H  . fenofibrate  54 mg Oral Daily  . Ferric Citrate  2 g Oral TID WC  . gabapentin  300 mg Oral TID  . insulin aspart  0-5 Units Subcutaneous QHS  . insulin aspart  0-9 Units Subcutaneous TID WC  . insulin glargine  7 Units Subcutaneous QHS  . levothyroxine  100 mcg Oral QHS  . metoCLOPramide  5 mg Oral TID PC & HS  . metoprolol tartrate  25 mg Oral BID  . midodrine  20 mg Oral Q M,W,F  . multivitamin  1 tablet Oral QHS  .  pantoprazole  40 mg Oral Daily  . rOPINIRole  2 mg Oral Q supper    Vital Signs    Vitals:   02/02/16 2016 02/03/16 0059 02/03/16 0500 02/03/16 0734  BP: (!) 155/77 135/66 136/61   Pulse: 92  80   Resp: 18 19 (!) 26   Temp: 98.9 F (37.2 C) 98.3 F (36.8 C) 98.5 F (36.9 C)   TempSrc: Oral Oral Oral   SpO2:  92% 100% 97%  Weight:      Height:       No intake or output data in the 24 hours ending 02/03/16 0917 Filed Weights   02/02/16 1102 02/02/16 1431  Weight: 275 lb (124.7 kg) 285 lb 1.6 oz (129.3 kg)    Physical Exam    GEN: Well nourished, well developed, in no acute distress.  Neck: Supple, no JVD, carotid bruits, or masses. Cardiac: RRR, no rubs, or gallops. No clubbing, cyanosis, severe diffuse edema.  Radials/DP/PT 2+ and equal bilaterally.  Respiratory:  Respirations  regular and unlabored, clear to auscultation bilaterally. GI: Soft, nontender, nondistended, BS + x 4. Neuro:  Strength and sensation are intact.   Labs    CBC  Recent Labs  02/02/16  0930 02/03/16 0227  WBC 5.3 5.7  NEUTROABS 3.4  --   HGB 7.9* 7.4*  HCT 26.4* 24.5*  MCV 96.0 95.3  PLT 375 782   Basic Metabolic Panel  Recent Labs  02/02/16 0930  NA 134*  K 3.5  CL 94*  CO2 30  GLUCOSE 132*  BUN 14  CREATININE 5.98*  CALCIUM 8.4*   Liver Function Tests No results for input(s): AST, ALT, ALKPHOS, BILITOT, PROT, ALBUMIN in the last 72 hours. No results for input(s): LIPASE, AMYLASE in the last 72 hours. Cardiac Enzymes  Recent Labs  02/02/16 1823 02/03/16 0227  TROPONINI 0.04* 0.04*   BNP Invalid input(s): POCBNP D-Dimer No results for input(s): DDIMER in the last 72 hours. Hemoglobin A1C  Recent Labs  02/02/16 1535  HGBA1C 5.8*   Fasting Lipid Panel No results for input(s): CHOL, HDL, LDLCALC, TRIG, CHOLHDL, LDLDIRECT in the last 72 hours. Thyroid Function Tests  Recent Labs  02/02/16 1535  TSH 4.840*    Telemetry    NSR with short runs of  NSVT  ECG    NA  Radiology    Ct Abdomen Pelvis Wo Contrast  Result Date: 01/19/2016 CLINICAL DATA:  50 year old male with abdominal pain, nausea vomiting. Low grade fever. EXAM: CT ABDOMEN AND PELVIS WITHOUT CONTRAST TECHNIQUE: Multidetector CT imaging of the abdomen and pelvis was performed following the standard protocol without IV contrast. COMPARISON:  Abdominal CT dated 12/1915 FINDINGS: Evaluation of this exam is limited in the absence of intravenous contrast. The visualized lung bases are clear. A There is coronary vascular calcification. There is hypoattenuation of the cardiac blood pool suggestive of a degree of anemia. Clinical correlation is recommended. No intra-abdominal free air or free fluid. Focal area of hypodensity in the periphery of the left lobe of the liver along the falciform ligament appears similar to prior study and most likely represents a focal fatty infiltration. No intrahepatic biliary ductal dilatation. The gallbladder is mildly distended. No definite calcified gallstone identified. No pericholecystic fluid. The pancreas is somewhat atrophic for the patient's age. No pancreatic ductal dilatation or peripancreatic stranding. The spleen and the adrenal glands appear unremarkable. The native kidneys are atrophic. Small bilateral renal hypodense lesions are not characterized But likely represent cysts. The visualized ureters appear unremarkable. The urinary bladder is collapsed. A left hemipelvic transplant kidney is noted which appears similar in morphology to the prior study. There is no hydronephrosis or nephrolithiasis of the transplant kidney. No peritransplant fluid collection identified. There is obliterated appearance of the renal sinus fat similar to the prior study but new compared to the CT dated 2014 concerning for failed transplant for chronic rejection. Clinical correlation is recommended. The prostate and seminal vesicles are grossly unremarkable. Small scattered  colonic diverticula without active inflammatory changes. Constipation. There is no evidence of bowel obstruction or active inflammation. Appendectomy. There is moderate aortoiliac atherosclerotic disease. The abdominal aorta and IVC are grossly unremarkable on this noncontrast study. No portal venous gas identified. There is atherosclerotic calcification of the mesentery vasculature. There is bilateral iliac artery atherosclerotic calcification. There is no retroperitoneal or mesenteric adenopathy. A 2.2 x 5.6 cm ovoid structure in the left inguinal region appears slightly larger compared to prior study. This may represent a small focal hematoma related to catheterization. Please see the aneurysm is not excluded. Duplex ultrasound may provide better evaluation. There is mild diffuse stranding and edema of the subcutaneous fat and an area of induration of the skin in the anterior pelvis.  No drainable fluid collection/abscess. Right inguinal surgical clips noted. There is a small infraumbilical hernia containing a short segment of small bowel loop without evidence of obstruction or inflammation. There is degenerative changes of the spine. No acute fracture. L1 inferior endplate Schmorl's node. IMPRESSION: No acute intra-abdominal pelvic pathology. Small left inguinal hematoma slightly increased in size compared to prior study. A pseudo aneurysm is not excluded. Duplex ultrasound may provide better evaluation. Left lower quadrant transplant kidney similar to the prior CT. There is obliteration of the renal sinus fat concerning for failed transplant. Constipation.  No bowel obstruction or active inflammation. Small scattered colonic diverticula. Diffuse subcutaneous stranding and edema. Electronically Signed   By: Anner Crete M.D.   On: 01/19/2016 21:45   Ct Abdomen Pelvis Wo Contrast  Result Date: 01/12/2016 CLINICAL DATA:  Mid abdominal pain for 2 days, initial encounter EXAM: CT ABDOMEN AND PELVIS WITHOUT  CONTRAST TECHNIQUE: Multidetector CT imaging of the abdomen and pelvis was performed following the standard protocol without IV contrast. COMPARISON:  09/30/2014, 07/04/2012 FINDINGS: Lower chest: Minimal atelectatic changes are noted. No focal confluent infiltrate is seen. Coronary calcifications are noted. Hepatobiliary: No focal liver abnormality is seen. No gallstones, gallbladder wall thickening, or biliary dilatation. Pancreas: Unremarkable. No pancreatic ductal dilatation or surrounding inflammatory changes. Spleen: Normal in size without focal abnormality. Adrenals/Urinary Tract: The adrenal glands are within normal limits. The kidneys are of small and shrunken consistent with the given clinical history of end-stage renal disease. The overall appearance of the kidneys is stable from the prior exam. Left pelvic renal transplant kidney is noted. No obstructive changes are seen. Some vascular calcifications are noted. The renal sinus fat seen on the previous exam of 2014 is not visualized on today's exam and mild perinephric inflammatory changes are noted. This is of uncertain significance as the patient is known to be on current hemodialysis. These changes may be related to chronic rejection. Clinical correlation is recommended. The bladder is decompressed. Stomach/Bowel: Scattered diverticular changes noted without evidence of diverticulitis. The appendix appears to have been surgically removed. No obstructive changes are seen. Vascular/Lymphatic: Scattered aortoiliac calcifications are noted without aneurysmal dilatation. No sizable lymphadenopathy is identified. Reproductive: Within normal limits. Other: Laxity of the anterior abdominal wall is again identified related to prior surgeries. Some soft tissue prominence is noted adjacent to the common femoral artery on the left likely related to the recent cardiac catheterization and a small subcutaneous hematoma. No pseudoaneurysm is identified.  Musculoskeletal: No acute or significant osseous findings. IMPRESSION: Changes of left pelvic renal transplant although obliteration of the renal sinus fat is noted when compared with the prior exam of 2014. This is of uncertain significance but may be related to chronic projection given the patient's current hemodialysis status. Diverticulosis without diverticulitis. Shrunken native kidneys consistent with end-stage renal disease. Changes consistent with the recent cardiac catheterization in the left groin. Small hematoma is noted measuring 3.5 cm in greatest dimension. No acute abnormality is noted. Electronically Signed   By: Inez Catalina M.D.   On: 01/12/2016 11:08   Dg Chest 2 View  Result Date: 02/02/2016 CLINICAL DATA:  Cough and shortness of breath for several hours EXAM: CHEST  2 VIEW COMPARISON:  01/28/2016 FINDINGS: Cardiac shadow remains enlarged. Mild central vascular congestion is noted without significant interstitial edema. No focal infiltrate is seen. Small bilateral pleural effusions are noted. No acute bony abnormality is seen. IMPRESSION: Mild vascular congestion and small pleural effusions. These have increased in the interval  from the prior exam. Electronically Signed   By: Inez Catalina M.D.   On: 02/02/2016 09:50   Dg Chest 2 View  Result Date: 01/19/2016 CLINICAL DATA:  Cough since 01/04/2016 EXAM: CHEST  2 VIEW COMPARISON:  01/12/2016 FINDINGS: There is mild bilateral interstitial thickening. There is no focal parenchymal opacity. There is no pleural effusion or pneumothorax. There is stable cardiomegaly. The osseous structures are unremarkable. IMPRESSION: Cardiomegaly with mild pulmonary vascular congestion. Electronically Signed   By: Kathreen Devoid   On: 01/19/2016 21:21   Dg Chest 2 View  Result Date: 01/12/2016 CLINICAL DATA:  Loss of appetite over the last 3 days, nausea, cough EXAM: CHEST  2 VIEW COMPARISON:  Chest x-ray of 01/06/2016 FINDINGS: Moderate cardiomegaly is  stable. Somewhat prominent interstitial markings are noted bilaterally and on the lateral view there may be a small amount of fluid in the fissure, suggesting possible mild interstitial edema. No definite pleural effusion is seen. No bony abnormality is noted. IMPRESSION: Cardiomegaly.  Question mild interstitial edema. Electronically Signed   By: Ivar Drape M.D.   On: 01/12/2016 12:11   Dg Chest 2 View  Result Date: 01/06/2016 CLINICAL DATA:  Cough for 2 weeks since discharge from hospital. Shortness of breath. EXAM: CHEST  2 VIEW COMPARISON:  Chest radiograph 01/01/2016 FINDINGS: Right IJ central venous catheter is been removed. Cardiomediastinal silhouette remains enlarged. No focal airspace consolidation or pulmonary edema. No pneumothorax or sizable pleural effusion. IMPRESSION: No active cardiopulmonary disease. Electronically Signed   By: Ulyses Jarred M.D.   On: 01/06/2016 22:39   Ct Chest High Resolution  Result Date: 01/29/2016 CLINICAL DATA:  50 year old male with history of shortness of breath and cough since being intubated in August 2017. EXAM: CT CHEST WITHOUT CONTRAST TECHNIQUE: Multidetector CT imaging of the chest was performed following the standard protocol without intravenous contrast. High resolution imaging of the lungs, as well as inspiratory and expiratory imaging, was performed. COMPARISON:  Chest CT 10/02/2014. FINDINGS: Cardiovascular: Heart size is mildly enlarged. There is no significant pericardial fluid, thickening or pericardial calcification. There is aortic atherosclerosis, as well as atherosclerosis of the great vessels of the mediastinum and the coronary arteries, including calcified atherosclerotic plaque in the left main, left anterior descending, left circumflex and right coronary arteries. Calcifications of the mitral annulus. Dilatation of the pulmonic trunk (3.8 cm in diameter). Mediastinum/Nodes: Multiple borderline enlarged and mildly enlarged mediastinal and  hilar lymph nodes, similar to the prior examination, measuring up to 15 mm in short axis in the right paratracheal nodal stations. Esophagus is unremarkable in appearance. No axillary lymphadenopathy. Lungs/Pleura: High-resolution images demonstrates some interlobular septal thickening throughout the lungs bilaterally, as well as some very mild patchy ground-glass attenuation. There is a small amount of pleural thickening and/or trace pleural fluid bilaterally. No parenchymal banding, traction bronchiectasis or frank honeycombing is noted. Inspiratory and expiratory imaging demonstrates some very mild air trapping, indicative of small airways disease. Notably, tearing expiratory phase imaging there is severe collapse of the trachea and mainstem bronchi, indicative of severe tracheobronchomalacia. No acute consolidative airspace disease. Upper Abdomen: Focal low-attenuation in segment 4 of the liver adjacent to the falciform ligament, most compatible with focal hepatic steatosis. Musculoskeletal: There are no aggressive appearing lytic or blastic lesions noted in the visualized portions of the skeleton. IMPRESSION: 1. Although there is some very mild patchy ground-glass attenuation and some interlobular septal thickening in the lungs, these findings are favored to reflect a background of very mild interstitial  pulmonary edema, related to underlying congestive heart failure, rather than being indicative of underlying interstitial lung disease. There also trace bilateral pleural effusions. 2. Dilatation of the pulmonic trunk (3.8 cm in diameter), concerning for underlying pulmonary arterial hypertension. 3. Severe tracheobronchomalacia. In addition, there is mild air trapping indicative of small airways disease. 4. Aortic atherosclerosis, in addition to left main and 3 vessel coronary artery disease. Please note that although the presence of coronary artery calcium documents the presence of coronary artery disease, the  severity of this disease and any potential stenosis cannot be assessed on this non-gated CT examination. Assessment for potential risk factor modification, dietary therapy or pharmacologic therapy may be warranted, if clinically indicated. 5. Multiple borderline enlarged and minimally enlarged mediastinal lymph nodes, similar to the prior study, likely chronic in the setting of underlying congestive heart failure. Electronically Signed   By: Vinnie Langton M.D.   On: 01/29/2016 08:41    Assessment & Plan    CHEST PAIN:  Negative troponin.  No suggestion of a cardiac etiology.  No further cardiac work up.   ACUTE ON CHRONIC DIASTOLIC HF:   The plan per renal is for a lower EDW.   FEVER:  One low grade fever since he has been here.  No clinical evidence of SBE.  Previous blood cultures drawn have been negative.  I would not suggest a TEE at this point but would suggest repeat cultures if there is clinical suspicion.      Signed, Minus Breeding, MD  02/03/2016, 9:17 AM

## 2016-02-03 NOTE — Procedures (Signed)
I have personally attended this patient's dialysis session.   UF goal 3-3.5 kg with today's treatment. I favor another HD tomorrow as well UF profile 4 Labs pending  [name], MD [place] Pager 02/03/2016, 1:52 PM

## 2016-02-03 NOTE — Progress Notes (Signed)
Pharmacy Antibiotic Note  Dale [name] is a 50 y.o. male admitted on 02/02/2016 with SOB. He is ESRD on HD-MWF. He was recently admitted in September with hypoxic respiratory failure and a NSTEMI resulting in a PCI. Now he is readmitted with chest pain and SOB. He also has abdominal wall cellulitis-we will start vancomycin today. He is also febrile, WBC wnl, blood cultures drawn.   Plan: -Vancomycin 2500 mg IV x1 then vancomycin 1 g IV x1 qHD-MWF -Goal pre-HD vancomycin level: 15-25 mcg/ml -Monitor cultures, HD tolerance, VR as needed   Height: 5\' 8"  (172.7 cm) Weight: 285 lb 1.6 oz (129.3 kg) IBW/kg (Calculated) : 68.4  Temp (24hrs), Avg:98.7 F (37.1 C), Min:98.3 F (36.8 C), Max:99.2 F (37.3 C)   Recent Labs Lab 01/29/16 1301 02/02/16 0930 02/03/16 0227  WBC 5.1 5.3 5.7  CREATININE 3.32* 5.98*  --     Estimated Creatinine Clearance: 19.4 mL/min (by C-G formula based on SCr of 5.98 mg/dL (H)).    Allergies  Allergen Reactions  . Contrast Media [Iodinated Diagnostic Agents] Rash and Itching    Systemic itching and transient rash appearance within hours of receiving contrast  Systemic itching and transient rash appearance within hours of receiving contrast  Redman Syndrome  . Rocephin [Ceftriaxone] Itching  . Amoxicillin Itching and Rash    .Has patient had a PCN reaction causing immediate rash, facial/tongue/throat swelling, SOB or lightheadedness with hypotension: Yes Has patient had a PCN reaction causing severe rash involving mucus membranes or skin necrosis: No Has patient had a PCN reaction that required hospitalization No Has patient had a PCN reaction occurring within the last 10 years: No If all of the above answers are "NO", then may proceed with Cephalosporin use.  . Buprenorphine Hcl Nausea Only and Hives  . Clopidogrel Rash  . Hydrocodone Rash  . Morphine Nausea Only, Rash and Hives  . Adhesive [Tape] Other (See Comments) and Itching    Blisters on arm  from adhesive tape at dialysis Blisters on arm from adhesive tape at dialysis  . Prednisone Other (See Comments)    Increases sugar levels  . Ciprofloxacin Itching and Rash  . Clindamycin/Lincomycin Hives and Itching  . Codeine Hives, Itching and Rash  . Doxycycline Rash  . Enalapril Rash    cough  . Levofloxacin Hives and Itching  . Mobic [Meloxicam] Hives and Rash  . Oxycodone Rash  . Vasotec Cough    Antimicrobials this admission: 10/11 vancomycin >   Dose adjustments this admission: NA   Microbiology results: 10/11 blood cx:   Thank you for allowing pharmacy to be a part of this patient's care.  Harvel Quale 02/03/2016 11:52 AM

## 2016-02-04 DIAGNOSIS — E1022 Type 1 diabetes mellitus with diabetic chronic kidney disease: Secondary | ICD-10-CM | POA: Diagnosis not present

## 2016-02-04 DIAGNOSIS — Z7982 Long term (current) use of aspirin: Secondary | ICD-10-CM | POA: Diagnosis not present

## 2016-02-04 DIAGNOSIS — I5033 Acute on chronic diastolic (congestive) heart failure: Secondary | ICD-10-CM | POA: Diagnosis not present

## 2016-02-04 DIAGNOSIS — R05 Cough: Secondary | ICD-10-CM | POA: Diagnosis not present

## 2016-02-04 DIAGNOSIS — I132 Hypertensive heart and chronic kidney disease with heart failure and with stage 5 chronic kidney disease, or end stage renal disease: Secondary | ICD-10-CM | POA: Diagnosis not present

## 2016-02-04 DIAGNOSIS — M797 Fibromyalgia: Secondary | ICD-10-CM | POA: Diagnosis present

## 2016-02-04 DIAGNOSIS — I5043 Acute on chronic combined systolic (congestive) and diastolic (congestive) heart failure: Secondary | ICD-10-CM | POA: Diagnosis not present

## 2016-02-04 DIAGNOSIS — L03311 Cellulitis of abdominal wall: Secondary | ICD-10-CM | POA: Diagnosis present

## 2016-02-04 DIAGNOSIS — N186 End stage renal disease: Secondary | ICD-10-CM | POA: Diagnosis not present

## 2016-02-04 DIAGNOSIS — I252 Old myocardial infarction: Secondary | ICD-10-CM | POA: Diagnosis not present

## 2016-02-04 DIAGNOSIS — Z89512 Acquired absence of left leg below knee: Secondary | ICD-10-CM | POA: Diagnosis not present

## 2016-02-04 DIAGNOSIS — Z6841 Body Mass Index (BMI) 40.0 and over, adult: Secondary | ICD-10-CM | POA: Diagnosis not present

## 2016-02-04 DIAGNOSIS — N2581 Secondary hyperparathyroidism of renal origin: Secondary | ICD-10-CM | POA: Diagnosis present

## 2016-02-04 DIAGNOSIS — I313 Pericardial effusion (noninflammatory): Secondary | ICD-10-CM | POA: Diagnosis not present

## 2016-02-04 DIAGNOSIS — J398 Other specified diseases of upper respiratory tract: Secondary | ICD-10-CM | POA: Insufficient documentation

## 2016-02-04 DIAGNOSIS — M109 Gout, unspecified: Secondary | ICD-10-CM | POA: Diagnosis present

## 2016-02-04 DIAGNOSIS — Z889 Allergy status to unspecified drugs, medicaments and biological substances status: Secondary | ICD-10-CM | POA: Diagnosis not present

## 2016-02-04 DIAGNOSIS — I25110 Atherosclerotic heart disease of native coronary artery with unstable angina pectoris: Secondary | ICD-10-CM | POA: Diagnosis present

## 2016-02-04 DIAGNOSIS — K3184 Gastroparesis: Secondary | ICD-10-CM | POA: Diagnosis present

## 2016-02-04 DIAGNOSIS — E8889 Other specified metabolic disorders: Secondary | ICD-10-CM | POA: Diagnosis not present

## 2016-02-04 DIAGNOSIS — I959 Hypotension, unspecified: Secondary | ICD-10-CM | POA: Diagnosis not present

## 2016-02-04 DIAGNOSIS — E662 Morbid (severe) obesity with alveolar hypoventilation: Secondary | ICD-10-CM | POA: Diagnosis not present

## 2016-02-04 DIAGNOSIS — Z9641 Presence of insulin pump (external) (internal): Secondary | ICD-10-CM | POA: Diagnosis present

## 2016-02-04 DIAGNOSIS — Z992 Dependence on renal dialysis: Secondary | ICD-10-CM | POA: Diagnosis not present

## 2016-02-04 DIAGNOSIS — Z794 Long term (current) use of insulin: Secondary | ICD-10-CM | POA: Diagnosis not present

## 2016-02-04 DIAGNOSIS — D638 Anemia in other chronic diseases classified elsewhere: Secondary | ICD-10-CM | POA: Diagnosis present

## 2016-02-04 DIAGNOSIS — R079 Chest pain, unspecified: Secondary | ICD-10-CM | POA: Diagnosis not present

## 2016-02-04 DIAGNOSIS — R0602 Shortness of breath: Secondary | ICD-10-CM | POA: Diagnosis not present

## 2016-02-04 DIAGNOSIS — G8929 Other chronic pain: Secondary | ICD-10-CM | POA: Diagnosis present

## 2016-02-04 LAB — GLUCOSE, CAPILLARY
Glucose-Capillary: 120 mg/dL — ABNORMAL HIGH (ref 65–99)
Glucose-Capillary: 151 mg/dL — ABNORMAL HIGH (ref 65–99)
Glucose-Capillary: 147 mg/dL — ABNORMAL HIGH (ref 65–99)
Glucose-Capillary: 195 mg/dL — ABNORMAL HIGH (ref 65–99)

## 2016-02-04 LAB — RENAL FUNCTION PANEL
Albumin: 2.5 g/dL — ABNORMAL LOW (ref 3.5–5.0)
Anion gap: 7 (ref 5–15)
BUN: 11 mg/dL (ref 6–20)
CO2: 30 mmol/L (ref 22–32)
Calcium: 8.1 mg/dL — ABNORMAL LOW (ref 8.9–10.3)
Chloride: 98 mmol/L — ABNORMAL LOW (ref 101–111)
Creatinine, Ser: 4.68 mg/dL — ABNORMAL HIGH (ref 0.61–1.24)
GFR calc Af Amer: 15 mL/min — ABNORMAL LOW (ref >60)
GFR calc non Af Amer: 13 mL/min — ABNORMAL LOW (ref >60)
Glucose, Bld: 131 mg/dL — ABNORMAL HIGH (ref 65–99)
Phosphorus: 2.7 mg/dL (ref 2.5–4.6)
Potassium: 4.3 mmol/L (ref 3.5–5.1)
Sodium: 135 mmol/L (ref 135–145)

## 2016-02-04 LAB — CBC
HCT: 26.3 % — ABNORMAL LOW (ref 39.0–52.0)
Hemoglobin: 7.9 g/dL — ABNORMAL LOW (ref 13.0–17.0)
MCH: 28.9 pg (ref 26.0–34.0)
MCHC: 30 g/dL (ref 30.0–36.0)
MCV: 96.3 fL (ref 78.0–100.0)
Platelets: 375 10*3/uL (ref 150–400)
RBC: 2.73 MIL/uL — ABNORMAL LOW (ref 4.22–5.81)
RDW: 16.9 % — ABNORMAL HIGH (ref 11.5–15.5)
WBC: 6.4 10*3/uL (ref 4.0–10.5)

## 2016-02-04 LAB — PROCALCITONIN: Procalcitonin: 0.91 ng/mL

## 2016-02-04 MED ORDER — ALBUTEROL SULFATE (2.5 MG/3ML) 0.083% IN NEBU
2.5000 mg | INHALATION_SOLUTION | Freq: Two times a day (BID) | RESPIRATORY_TRACT | Status: DC
  Administered 2016-02-04 – 2016-02-05 (×2): 2.5 mg via RESPIRATORY_TRACT
  Filled 2016-02-04 (×3): qty 3

## 2016-02-04 MED ORDER — HYDROMORPHONE HCL 1 MG/ML IJ SOLN
INTRAMUSCULAR | Status: AC
  Filled 2016-02-04: qty 1

## 2016-02-04 MED ORDER — MIDODRINE HCL 5 MG PO TABS: 20.0000 mg | ORAL_TABLET | ORAL | Status: DC | PRN

## 2016-02-04 MED ORDER — POLYETHYLENE GLYCOL 3350 17 G PO PACK
17.0000 g | PACK | Freq: Every day | ORAL | Status: DC
  Administered 2016-02-04 – 2016-02-06 (×2): 17 g via ORAL
  Filled 2016-02-04 (×2): qty 1

## 2016-02-04 MED ORDER — VANCOMYCIN HCL IN DEXTROSE 1-5 GM/200ML-% IV SOLN
1000.0000 mg | Freq: Once | INTRAVENOUS | Status: AC
  Administered 2016-02-04: 1000 mg via INTRAVENOUS
  Filled 2016-02-04: qty 200

## 2016-02-04 MED ORDER — VANCOMYCIN HCL IN DEXTROSE 1-5 GM/200ML-% IV SOLN
INTRAVENOUS | Status: AC
  Filled 2016-02-04: qty 200

## 2016-02-04 NOTE — Progress Notes (Addendum)
[facility] Rounding Note  Subjective/Interval History:   Dialysis yesterday Got off 4 liters with weight to 123.5 kg Getting extra HD today with goal of another 4 liters as BP allows Will have HD tomorrow on regular schedule Still SOB and with some wheezing Abd wall still quite sore (and very edematous) CT c/w R abd cellulitis   Objective Vital signs in last 24 hours: Vitals:   02/03/16 2120 02/03/16 2208 02/04/16 0459 02/04/16 0727  BP:  (!) 145/60 (!) 134/54   Pulse:  93 77   Resp:  20 (!) 22   Temp:  98.9 F (37.2 C) 98.1 F (36.7 C)   TempSrc:  Oral Oral   SpO2: 99% 97% 100% 97%  Weight:   125.7 kg (277 lb 3.2 oz)   Height:       Physical Exam:  Blood pressure (!) 134/54, pulse 77, temperature 98.1 F (36.7 C), temperature source Oral, resp. rate (!) 22, height 5\' 8"  (1.727 m), weight 125.7 kg (277 lb 3.2 oz), SpO2 97 %. Very nice MO WM Seen on dialysis Fine crackles both lung bases with exp wheezing this AM Cardiac rhythm regular Normal S1S2 No S3 Large abd/huge pannus with pitting edema of the entire abd wall  Cellulitic change R lateral abd wall RLE with woody/pitting edema, wearing compression stocking L BK stump OK  Labs:  Recent Labs Lab 01/29/16 1301 02/02/16 0930 02/04/16 0417  NA 136 134* 135  K 3.3* 3.5 4.3  CL 95* 94* 98*  CO2 34* 30 30  GLUCOSE 124* 132* 131*  BUN 6 14 11   CREATININE 3.32* 5.98* 4.68*  CALCIUM 8.1* 8.4* 8.1*  PHOS  --   --  2.7  Albumin        2.5  Recent Labs Lab 01/29/16 1301 02/02/16 0930 02/03/16 0227 02/04/16 0417  WBC 5.1 5.3 5.7 6.4  NEUTROABS  --  3.4  --   --   HGB 8.4 Repeated and verified X2.* 7.9* 7.4* 7.9*  HCT 25.8 Repeated and verified X2.* 26.4* 24.5* 26.3*  MCV 90.1 96.0 95.3 96.3  PLT 372.0 375 343 375    Recent Labs Lab 02/02/16 1823 02/03/16 0227  TROPONINI 0.04* 0.04*    Recent Labs Lab 02/02/16 2113 02/03/16 0749 02/03/16 1126 02/03/16 2206 02/04/16 0750  GLUCAP 281* 135* 111* 233*  120*   Blood cultures 10/11 neg to date  Iron/TIBC/Ferritin/ %Sat    Component Value Date/Time   IRON 26 (L) 02/03/2016 1454   TIBC 132 (L) 02/03/2016 1454   FERRITIN 1,022 (H) 02/03/2016 1454   IRONPCTSAT 20 02/03/2016 1454    Studies/Results: Ct Abdomen Pelvis Wo Contrast  Result Date: 02/03/2016 CLINICAL DATA:  Acute onset of right-sided abdominal wall cellulitis, with pain and nausea. Initial encounter. EXAM: CT ABDOMEN AND PELVIS WITHOUT CONTRAST TECHNIQUE: Multidetector CT imaging of the abdomen and pelvis was performed following the standard protocol without IV contrast. COMPARISON:  CT of the abdomen and pelvis from 01/19/2016 FINDINGS: Lower chest: The visualized lung bases are grossly clear. Diffuse coronary artery calcifications are seen. Hepatobiliary: The liver is unremarkable in appearance. Mild fatty infiltration is noted about the falciform ligament. The gallbladder is unremarkable in appearance. The common bile duct remains normal in caliber. Pancreas: The pancreas is within normal limits. Spleen: The spleen is unremarkable in appearance. Adrenals/Urinary Tract: The adrenal glands are unremarkable in appearance. Severe bilateral native renal atrophy is noted. There is no evidence of hydronephrosis. No renal or ureteral stones are identified. No  perinephric stranding is seen. The transplant kidney at the left iliac fossa is similar in appearance, though it demonstrates perhaps slightly increased surrounding inflammation. Underlying infection cannot be entirely excluded. Alternatively, this may reflect previously described transplant failure, given effacement of the renal sinus fat. Stomach/Bowel: The stomach is unremarkable in appearance. The small bowel is within normal limits. The patient is status post appendectomy. The colon is unremarkable in appearance. Vascular/Lymphatic: Scattered calcification is seen along the abdominal aorta and its branches. The abdominal aorta is  otherwise grossly unremarkable. The inferior vena cava is grossly unremarkable. Visualized retroperitoneal nodes remain borderline normal in size. No pelvic sidewall lymphadenopathy is identified. An enlarged right inguinal node is noted, measuring 1.8 cm in short axis. The previously noted left inguinal hematoma has decreased in size, measuring 3.8 x 1.6 cm. Reproductive: The bladder is decompressed and not well assessed. The prostate remains normal in size. Other: Soft tissue inflammation and skin thickening is noted along the right anterior lateral abdominal wall. There is also mild soft tissue inflammation and skin thickening at the pannus. Musculoskeletal: No acute osseous abnormalities are identified. The visualized musculature is unremarkable in appearance. IMPRESSION: 1. Soft tissue inflammation and skin thickening along the right anterior lateral abdominal wall. Mild soft tissue inflammation and skin thickening at the pannus. Findings reflect the patient's known cellulitis. No evidence of abscess. 2. Mildly increased inflammation about the transplant kidney at the left iliac fossa. Underlying infection cannot be entirely excluded. Alternatively, this may reflect previously described transplant failure, given effacement of the renal sinus fat. 3. Diffuse coronary artery calcifications seen. 4. Scattered aortic atherosclerosis noted. 5. Severe bilateral native renal atrophy noted. 6. Enlarged right inguinal node, measuring 1.8 cm in short axis. 7. Interval decrease in size of left inguinal hematoma, measuring 3.8 x 1.6 cm. Electronically Signed   By: Garald Balding M.D.   On: 02/03/2016 22:20   Dg Chest 2 View  Result Date: 02/02/2016 CLINICAL DATA:  Cough and shortness of breath for several hours EXAM: CHEST  2 VIEW COMPARISON:  01/28/2016 FINDINGS: Cardiac shadow remains enlarged. Mild central vascular congestion is noted without significant interstitial edema. No focal infiltrate is seen. Small  bilateral pleural effusions are noted. No acute bony abnormality is seen. IMPRESSION: Mild vascular congestion and small pleural effusions. These have increased in the interval from the prior exam. Electronically Signed   By: Inez Catalina M.D.   On: 02/02/2016 09:50   Medications:   . albuterol  2.5 mg Nebulization TID  . allopurinol  200 mg Oral Daily  . aspirin EC  81 mg Oral QHS  . atorvastatin  80 mg Oral q1800  . calcium acetate  2,001 mg Oral TID WC  . darbepoetin (ARANESP) injection - DIALYSIS  200 mcg Intravenous Q Wed-HD  . doxercalciferol  3 mcg Intravenous Q M,W,F-HD  . enoxaparin (LOVENOX) injection  40 mg Subcutaneous Q24H  . fenofibrate  54 mg Oral Daily  . Ferric Citrate  2 g Oral TID WC  . ferric gluconate (FERRLECIT/NULECIT) IV  62.5 mg Intravenous Q Wed-HD  . gabapentin  300 mg Oral TID  . insulin aspart  0-5 Units Subcutaneous QHS  . insulin aspart  0-9 Units Subcutaneous TID WC  . insulin glargine  7 Units Subcutaneous QHS  . levothyroxine  100 mcg Oral QHS  . metoCLOPramide  5 mg Oral TID PC & HS  . metoprolol tartrate  25 mg Oral BID  . midodrine  20 mg Oral Q M,W,F  .  multivitamin  1 tablet Oral QHS  . pantoprazole  40 mg Oral Daily  . rOPINIRole  2 mg Oral Q supper  . [START ON 02/05/2016] vancomycin  1,000 mg Intravenous Q M,W,F-HD     Dialysis Orders:  Center: MWF at [facility] unit 4:15 hours,  EDW 126kg, will have lower EDW  2K/2.25Ca bath,  BFR 350/DFR800,  AVF - Hectoral 22mcg IV q HD - Venofer 50mg  IV weekly - Mircera 268mcg IV q 2 weeks (last given 9/13)  Assessment/Plan:  1. Chest pain: Trops flat. Echo no vegetations. Grade 2 DD. EF 55-60%   2. Dyspnea/Volume overload: Has lost a substantial amount of body mass since his last admission without concurrent lowering of EDW and has accumulated large amt extra volume - in fact anasarca. Will be substantially lowering EDW.  3. ESRD: MWF schedule. 4 liters off yesterday. HD today for another 4 liters  as BP allows. HD again tomorrow back on schedule. Current EDW 126 - I suspect will be under 120 kg  4. Anemia: Hgb 7.9. Starting Aranesp 235mcg IV weekly. TSat 20. Weekly Fe  5. Metabolic bone disease: Ca 8.4. Continue binders/VRDA. Phos sl  low  2.1  6. Abdominal wall cellulitus - R sided primarily. CT confirms cellulitic change, no abscess seen. Vancomycin started yesterday (multiple drug allergies preclude other meds). Exam unchanged so far.    [name], MD [facility] [phone] pager 02/04/2016, 8:28 AM

## 2016-02-04 NOTE — Assessment & Plan Note (Signed)
Pt has had multiple surgical procedures with intubation , CT neck and chest show tracheobronchomalacia Treatment options are limited . For now will check sleep study to see if OSA , as CPAP may help  Will cont to follow  Case discussed with Dr. Chase Caller  May need referral to [facility] or [facility]

## 2016-02-04 NOTE — Progress Notes (Signed)
PROGRESS NOTE    Worth Kober  WHQ:[id number] DOB: 23-Aug-1965 DOA: 02/02/2016 PCP: [name], MD   Outpatient Specialists:     Brief Narrative:  Dale [name] is a 50 y.o. male who presents with acute on chronic congestive heart failure secondary to fluid overload from ESRD on dialysis. Patient also with chest pain which is likely secondary to underlying pulmonary congestion. Doubt true ACS at this time but will rule out. Patient also c/o chronic N/V as well as redness on right flank   Assessment & Plan:   Principal Problem:   Chest pain at rest Active Problems:   Obstructive sleep apnea   Acute on chronic diastolic heart failure (HCC)   Pericardial effusion   Nausea   HTN (hypertension)   Coronary artery disease involving native coronary artery of native heart with unstable angina pectoris (HCC)   ESRD on dialysis (HCC)   Chronic pain   Diabetes mellitus (HCC)   FUO (fever of unknown origin)   Chest pain   Anemia of chronic disease   Diabetes mellitus with complication (HCC)   Cellulitis of right abdominal wall   Chest pain at rest. CE stable Echo done and shows diastolic CHF  Abdominal wall cellulitis -vanc with dialysis   Acute on chronic diastolic heart failure. -dialysis today   End-stage renal disease on dialysis. Monday Wednesday Friday schedule -Nephrology consult- for HD tomm-- plan to lower weight -Continue home meds  Anemia of chronic disease. Hemoglobin 7.9. Chart review indicates 2 weeks ago hemoglobin 9.1. Of note he underwent EGD last hospitalization revealing Normal esophagus. - Erythematous mucosa in the cardia, gastric fundus and gastric body. Normal examined duodenum. - Did not identify any high risk lesion on upper endoscopy to cause possible brisk upper GI blood loss  No specimens collected  transfusion per nephrology  Diabetes. Patient is on insulin pump at home. Serum glucose 132 on admission. Patient reports history of  hypoglycemic events when in the hospital and on his pump request discontinuation of his pump -Sliding scale insulin for optimal control -Low-dose Lantus -Monitor   Hypertension. Fair control in the emergency department. -Continue home beta blocker -Monitor   Chronic pain. He is on oral Dilaudid but requesting IV Dilaudid as his " fibromyalgia pain is much worse when he is in the hospital".  -Continue gabapentin - low dose IV Dilaudid started in ER   Persistent nausea vomiting -Wife reports follow-up appointment with GI at the end of the week. This is been going on for several weeks. Home medications include Reglan, Phenergan -Patient requesting food on admission  Obstructive sleep apnea. He says he was scheduled for an evaluation next week.  Morbid Obesity. BMI 41.9. -Nutritional consult   CAD. Recent non-STEMI with positive troponin. Heart cath with DES on September 8.   DVT prophylaxis:  Lovenox  Code Status: Full Code   Family Communication: patient  Disposition Plan:  Home after HD tomm   Consultants:   Cards  nephro    Subjective: For HD in AM tomm  Objective: Vitals:   02/04/16 1130 02/04/16 1200 02/04/16 1230 02/04/16 1327  BP: 115/61 (!) 122/57 (!) 132/59 (!) 113/57  Pulse: 84 86 86 94  Resp: (!) 21 20 16 16   Temp:   98.1 F (36.7 C) 98 F (36.7 C)  TempSrc:   Oral Oral  SpO2: 100%  100% 95%  Weight:      Height:        Intake/Output Summary (Last 24  hours) at 02/04/16 1416 Last data filed at 02/04/16 1230  Gross per 24 hour  Intake              105 ml  Output             7500 ml  Net            -7395 ml   Filed Weights   02/03/16 1744 02/04/16 0459 02/04/16 0822  Weight: 123.5 kg (272 lb 4.3 oz) 125.7 kg (277 lb 3.2 oz) 123.6 kg (272 lb 7.8 oz)    Examination:  General exam: Appears calm and comfortable  Respiratory system: Clear to auscultation. Respiratory effort normal. Cardiovascular system: S1 & S2 heard, RRR. No  JVD, murmurs, rubs, gallops or clicks. No pedal edema. Gastrointestinal system: +BS, multiple scars, morbidly obese Skin: faint redness on right flank Psychiatry: Judgement and insight appear normal. Mood & affect appropriate.     Data Reviewed: I have personally reviewed following labs and imaging studies  CBC:  Recent Labs Lab 01/29/16 1301 02/02/16 0930 02/03/16 0227 02/04/16 0417  WBC 5.1 5.3 5.7 6.4  NEUTROABS  --  3.4  --   --   HGB 8.4 Repeated and verified X2.* 7.9* 7.4* 7.9*  HCT 25.8 Repeated and verified X2.* 26.4* 24.5* 26.3*  MCV 90.1 96.0 95.3 96.3  PLT 372.0 375 343 563   Basic Metabolic Panel:  Recent Labs Lab 01/29/16 1301 02/02/16 0930 02/04/16 0417  NA 136 134* 135  K 3.3* 3.5 4.3  CL 95* 94* 98*  CO2 34* 30 30  GLUCOSE 124* 132* 131*  BUN 6 14 11   CREATININE 3.32* 5.98* 4.68*  CALCIUM 8.1* 8.4* 8.1*  PHOS  --   --  2.7   GFR: Estimated Creatinine Clearance: 24.2 mL/min (by C-G formula based on SCr of 4.68 mg/dL (H)). Liver Function Tests:  Recent Labs Lab 02/04/16 0417  ALBUMIN 2.5*   No results for input(s): LIPASE, AMYLASE in the last 168 hours. No results for input(s): AMMONIA in the last 168 hours. Coagulation Profile: No results for input(s): INR, PROTIME in the last 168 hours. Cardiac Enzymes:  Recent Labs Lab 02/02/16 1823 02/03/16 0227  TROPONINI 0.04* 0.04*   BNP (last 3 results) No results for input(s): PROBNP in the last 8760 hours. HbA1C:  Recent Labs  02/02/16 1535  HGBA1C 5.8*   CBG:  Recent Labs Lab 02/03/16 0749 02/03/16 1126 02/03/16 2206 02/04/16 0750 02/04/16 1324  GLUCAP 135* 111* 233* 120* 151*   Lipid Profile: No results for input(s): CHOL, HDL, LDLCALC, TRIG, CHOLHDL, LDLDIRECT in the last 72 hours. Thyroid Function Tests:  Recent Labs  02/02/16 1535  TSH 4.840*   Anemia Panel:  Recent Labs  02/03/16 1454  FERRITIN 1,022*  TIBC 132*  IRON 26*   Urine analysis:    Component  Value Date/Time   COLORURINE YELLOW 06/16/2013 2059   APPEARANCEUR CLEAR 06/16/2013 2059   LABSPEC 1.018 06/16/2013 2059   PHURINE 5.5 06/16/2013 2059   GLUCOSEU NEGATIVE 06/16/2013 2059   HGBUR SMALL (A) 06/16/2013 2059   BILIRUBINUR NEGATIVE 06/16/2013 2059   Kathleen NEGATIVE 06/16/2013 2059   PROTEINUR 100 (A) 06/16/2013 2059   UROBILINOGEN 0.2 06/16/2013 2059   NITRITE NEGATIVE 06/16/2013 2059   LEUKOCYTESUR NEGATIVE 06/16/2013 2059     ) Recent Results (from the past 240 hour(s))  Culture, blood (Routine X 2) w Reflex to ID Panel     Status: None (Preliminary result)   Collection Time: 02/03/16  1:22 PM  Result Value Ref Range Status   Specimen Description BLOOD DIALYSIS  Final   Special Requests BOTTLES DRAWN AEROBIC AND ANAEROBIC 10CC  Final   Culture NO GROWTH < 24 HOURS  Final   Report Status PENDING  Incomplete  Culture, blood (Routine X 2) w Reflex to ID Panel     Status: None (Preliminary result)   Collection Time: 02/03/16  1:22 PM  Result Value Ref Range Status   Specimen Description BLOOD DIALYSIS  Final   Special Requests BOTTLES DRAWN AEROBIC AND ANAEROBIC 5CC  Final   Culture NO GROWTH < 24 HOURS  Final   Report Status PENDING  Incomplete      Anti-infectives    Start     Dose/Rate Route Frequency Ordered Stop   02/05/16 1200  vancomycin (VANCOCIN) IVPB 1000 mg/200 mL premix     1,000 mg 200 mL/hr over 60 Minutes Intravenous Every M-W-F (Hemodialysis) 02/03/16 1159     02/04/16 1029  vancomycin (VANCOCIN) 1-5 GM/200ML-% IVPB    Comments:  Cherylann Banas   : cabinet override      02/04/16 1029 02/04/16 1131   02/04/16 1000  vancomycin (VANCOCIN) IVPB 1000 mg/200 mL premix     1,000 mg 200 mL/hr over 60 Minutes Intravenous  Once 02/04/16 0838 02/04/16 1230   02/03/16 1300  vancomycin (VANCOCIN) 2,500 mg in sodium chloride 0.9 % 250 mL IVPB     2,500 mg 250 mL/hr over 60 Minutes Intravenous  Once 02/03/16 1159 02/03/16 1713   02/03/16 1200  vancomycin  (VANCOCIN) 2,500 mg in sodium chloride 0.9 % 500 mL IVPB  Status:  Discontinued     2,500 mg 250 mL/hr over 120 Minutes Intravenous  Once 02/03/16 1120 02/03/16 1159       Radiology Studies: Ct Abdomen Pelvis Wo Contrast  Result Date: 02/03/2016 CLINICAL DATA:  Acute onset of right-sided abdominal wall cellulitis, with pain and nausea. Initial encounter. EXAM: CT ABDOMEN AND PELVIS WITHOUT CONTRAST TECHNIQUE: Multidetector CT imaging of the abdomen and pelvis was performed following the standard protocol without IV contrast. COMPARISON:  CT of the abdomen and pelvis from 01/19/2016 FINDINGS: Lower chest: The visualized lung bases are grossly clear. Diffuse coronary artery calcifications are seen. Hepatobiliary: The liver is unremarkable in appearance. Mild fatty infiltration is noted about the falciform ligament. The gallbladder is unremarkable in appearance. The common bile duct remains normal in caliber. Pancreas: The pancreas is within normal limits. Spleen: The spleen is unremarkable in appearance. Adrenals/Urinary Tract: The adrenal glands are unremarkable in appearance. Severe bilateral native renal atrophy is noted. There is no evidence of hydronephrosis. No renal or ureteral stones are identified. No perinephric stranding is seen. The transplant kidney at the left iliac fossa is similar in appearance, though it demonstrates perhaps slightly increased surrounding inflammation. Underlying infection cannot be entirely excluded. Alternatively, this may reflect previously described transplant failure, given effacement of the renal sinus fat. Stomach/Bowel: The stomach is unremarkable in appearance. The small bowel is within normal limits. The patient is status post appendectomy. The colon is unremarkable in appearance. Vascular/Lymphatic: Scattered calcification is seen along the abdominal aorta and its branches. The abdominal aorta is otherwise grossly unremarkable. The inferior vena cava is grossly  unremarkable. Visualized retroperitoneal nodes remain borderline normal in size. No pelvic sidewall lymphadenopathy is identified. An enlarged right inguinal node is noted, measuring 1.8 cm in short axis. The previously noted left inguinal hematoma has decreased in size, measuring 3.8 x 1.6 cm. Reproductive:  The bladder is decompressed and not well assessed. The prostate remains normal in size. Other: Soft tissue inflammation and skin thickening is noted along the right anterior lateral abdominal wall. There is also mild soft tissue inflammation and skin thickening at the pannus. Musculoskeletal: No acute osseous abnormalities are identified. The visualized musculature is unremarkable in appearance. IMPRESSION: 1. Soft tissue inflammation and skin thickening along the right anterior lateral abdominal wall. Mild soft tissue inflammation and skin thickening at the pannus. Findings reflect the patient's known cellulitis. No evidence of abscess. 2. Mildly increased inflammation about the transplant kidney at the left iliac fossa. Underlying infection cannot be entirely excluded. Alternatively, this may reflect previously described transplant failure, given effacement of the renal sinus fat. 3. Diffuse coronary artery calcifications seen. 4. Scattered aortic atherosclerosis noted. 5. Severe bilateral native renal atrophy noted. 6. Enlarged right inguinal node, measuring 1.8 cm in short axis. 7. Interval decrease in size of left inguinal hematoma, measuring 3.8 x 1.6 cm. Electronically Signed   By: Garald Balding M.D.   On: 02/03/2016 22:20        Scheduled Meds: . albuterol  2.5 mg Nebulization BID  . allopurinol  200 mg Oral Daily  . aspirin EC  81 mg Oral QHS  . atorvastatin  80 mg Oral q1800  . calcium acetate  2,001 mg Oral TID WC  . darbepoetin (ARANESP) injection - DIALYSIS  200 mcg Intravenous Q Wed-HD  . doxercalciferol  3 mcg Intravenous Q M,W,F-HD  . enoxaparin (LOVENOX) injection  40 mg  Subcutaneous Q24H  . fenofibrate  54 mg Oral Daily  . Ferric Citrate  2 g Oral TID WC  . ferric gluconate (FERRLECIT/NULECIT) IV  62.5 mg Intravenous Q Wed-HD  . gabapentin  300 mg Oral TID  . insulin aspart  0-5 Units Subcutaneous QHS  . insulin aspart  0-9 Units Subcutaneous TID WC  . insulin glargine  7 Units Subcutaneous QHS  . levothyroxine  100 mcg Oral QHS  . metoCLOPramide  5 mg Oral TID PC & HS  . metoprolol tartrate  25 mg Oral BID  . midodrine  20 mg Oral Q M,W,F  . multivitamin  1 tablet Oral QHS  . pantoprazole  40 mg Oral Daily  . rOPINIRole  2 mg Oral Q supper  . [START ON 02/05/2016] vancomycin  1,000 mg Intravenous Q M,W,F-HD   Continuous Infusions:    LOS: 0 days    Time spent: 25 min    [place], DO [facility] Pager [phone]  If 7PM-7AM, please contact night-coverage [email or web address] Password [email or web address] 02/04/2016, 2:16 PM

## 2016-02-04 NOTE — Assessment & Plan Note (Signed)
Dyspnea -suspect is multifactoral with component of volume overload due to his ESRD/HD CT chest does not show ILD   Plan  Patient Instructions  We are setting you up for a split night sleep study .  We we get you rescheduled for GI Chester Holstein NP ) for gastroparesis .  Continue on current regimen  Follow up Dr. Chase Caller in 2-3 months and As needed

## 2016-02-04 NOTE — Assessment & Plan Note (Signed)
Ongoing nausea with DM gastroparesis  Needs to see GI   Plan  Patient Instructions  We are setting you up for a split night sleep study .  We we get you rescheduled for GI Chester Holstein NP ) for gastroparesis .  Continue on current regimen  Follow up Dr. Chase Caller in 2-3 months and As needed

## 2016-02-04 NOTE — Assessment & Plan Note (Signed)
Needs sleep study   Plan  Patient Instructions  We are setting you up for a split night sleep study .  We we get you rescheduled for GI Chester Holstein NP ) for gastroparesis .  Continue on current regimen  Follow up Dr. Chase Caller in 2-3 months and As needed

## 2016-02-04 NOTE — Procedures (Signed)
I have personally attended this patient's dialysis session.   L AVF 350 UF goal 3.5-4 liters Pre weight 123.6 kg BP stable so far  [name], MD [facility] [phone] Pager 02/04/2016, 8:51 AM

## 2016-02-04 NOTE — Progress Notes (Signed)
Pharmacy Antibiotic Note  Dale [name] is a 50 y.o. male admitted on 02/02/2016 with SOB. He is ESRD on HD-MWF. He was recently admitted in September with hypoxic respiratory failure and a NSTEMI resulting in a PCI. Now he is readmitted with chest pain and SOB. He also has abdominal wall cellulitis on vancomycin  -WBC= 6.4, cultures pending, PCT 0.68>>0.91 -HD 10/11 (4hr at 350/hr) and 10/12 (4hr at 350/hr)  Plan: -Vancomycin 1000mg  IV today  -Goal pre-HD vancomycin level: 15-25 mcg/ml -Monitor cultures, HD tolerance, VR as needed   Height: 5\' 8"  (172.7 cm) Weight: 277 lb 3.2 oz (125.7 kg) IBW/kg (Calculated) : 68.4  Temp (24hrs), Avg:98.1 F (36.7 C), Min:97.6 F (36.4 C), Max:98.9 F (37.2 C)   Recent Labs Lab 01/29/16 1301 02/02/16 0930 02/03/16 0227 02/04/16 0417  WBC 5.1 5.3 5.7 6.4  CREATININE 3.32* 5.98*  --  4.68*    Estimated Creatinine Clearance: 24.4 mL/min (by C-G formula based on SCr of 4.68 mg/dL (H)).    Allergies  Allergen Reactions  . Contrast Media [Iodinated Diagnostic Agents] Rash and Itching    Systemic itching and transient rash appearance within hours of receiving contrast  Systemic itching and transient rash appearance within hours of receiving contrast  Redman Syndrome  . Rocephin [Ceftriaxone] Itching  . Amoxicillin Itching and Rash    .Has patient had a PCN reaction causing immediate rash, facial/tongue/throat swelling, SOB or lightheadedness with hypotension: Yes Has patient had a PCN reaction causing severe rash involving mucus membranes or skin necrosis: No Has patient had a PCN reaction that required hospitalization No Has patient had a PCN reaction occurring within the last 10 years: No If all of the above answers are "NO", then may proceed with Cephalosporin use.  . Buprenorphine Hcl Nausea Only and Hives  . Clopidogrel Rash  . Hydrocodone Rash  . Morphine Nausea Only, Rash and Hives  . Adhesive [Tape] Other (See Comments) and  Itching    Blisters on arm from adhesive tape at dialysis Blisters on arm from adhesive tape at dialysis  . Prednisone Other (See Comments)    Increases sugar levels  . Ciprofloxacin Itching and Rash  . Clindamycin/Lincomycin Hives and Itching  . Codeine Hives, Itching and Rash  . Doxycycline Rash  . Enalapril Rash    cough  . Levofloxacin Hives and Itching  . Mobic [Meloxicam] Hives and Rash  . Oxycodone Rash  . Vasotec Cough    Antimicrobials this admission: 10/11 vancomycin >   Dose adjustments this admission: NA   Microbiology results: 10/11 blood cx:   Thank you for allowing pharmacy to be a part of this patient's care.  Hildred Laser, Pharm D 02/04/2016 8:36 AM

## 2016-02-05 ENCOUNTER — Ambulatory Visit: Payer: Medicare Other | Admitting: Gastroenterology

## 2016-02-05 ENCOUNTER — Encounter (HOSPITAL_COMMUNITY): Payer: Self-pay | Admitting: General Practice

## 2016-02-05 LAB — RENAL FUNCTION PANEL
Albumin: 2.4 g/dL — ABNORMAL LOW (ref 3.5–5.0)
Anion gap: 9 (ref 5–15)
BUN: 9 mg/dL (ref 6–20)
CO2: 27 mmol/L (ref 22–32)
Calcium: 8.3 mg/dL — ABNORMAL LOW (ref 8.9–10.3)
Chloride: 96 mmol/L — ABNORMAL LOW (ref 101–111)
Creatinine, Ser: 4.61 mg/dL — ABNORMAL HIGH (ref 0.61–1.24)
GFR calc Af Amer: 16 mL/min — ABNORMAL LOW (ref >60)
GFR calc non Af Amer: 14 mL/min — ABNORMAL LOW (ref >60)
Glucose, Bld: 204 mg/dL — ABNORMAL HIGH (ref 65–99)
Phosphorus: 2.7 mg/dL (ref 2.5–4.6)
Potassium: 3.6 mmol/L (ref 3.5–5.1)
Sodium: 132 mmol/L — ABNORMAL LOW (ref 135–145)

## 2016-02-05 LAB — GLUCOSE, CAPILLARY
Glucose-Capillary: 85 mg/dL (ref 65–99)
Glucose-Capillary: 231 mg/dL — ABNORMAL HIGH (ref 65–99)
Glucose-Capillary: 226 mg/dL — ABNORMAL HIGH (ref 65–99)
Glucose-Capillary: 260 mg/dL — ABNORMAL HIGH (ref 65–99)

## 2016-02-05 LAB — CBC
HCT: 25.8 % — ABNORMAL LOW (ref 39.0–52.0)
Hemoglobin: 7.8 g/dL — ABNORMAL LOW (ref 13.0–17.0)
MCH: 29.1 pg (ref 26.0–34.0)
MCHC: 30.2 g/dL (ref 30.0–36.0)
MCV: 96.3 fL (ref 78.0–100.0)
Platelets: 367 10*3/uL (ref 150–400)
RBC: 2.68 MIL/uL — ABNORMAL LOW (ref 4.22–5.81)
RDW: 16.8 % — ABNORMAL HIGH (ref 11.5–15.5)
WBC: 5.5 10*3/uL (ref 4.0–10.5)

## 2016-02-05 LAB — HEMOGLOBIN AND HEMATOCRIT, BLOOD
HCT: 29.4 % — ABNORMAL LOW (ref 39.0–52.0)
Hemoglobin: 9 g/dL — ABNORMAL LOW (ref 13.0–17.0)

## 2016-02-05 MED ORDER — NEPRO/CARBSTEADY PO LIQD
237.0000 mL | Freq: Two times a day (BID) | ORAL | Status: DC
  Administered 2016-02-05 – 2016-02-06 (×3): 237 mL via ORAL
  Filled 2016-02-05 (×5): qty 237

## 2016-02-05 MED ORDER — PANTOPRAZOLE SODIUM 40 MG PO TBEC
40.0000 mg | DELAYED_RELEASE_TABLET | Freq: Two times a day (BID) | ORAL | Status: DC
  Administered 2016-02-05 – 2016-02-06 (×2): 40 mg via ORAL
  Filled 2016-02-05 (×2): qty 1

## 2016-02-05 MED ORDER — VANCOMYCIN HCL IN DEXTROSE 1-5 GM/200ML-% IV SOLN
INTRAVENOUS | Status: AC
Start: 2016-02-05 — End: 2016-02-05
  Filled 2016-02-05: qty 200

## 2016-02-05 MED ORDER — POLYETHYLENE GLYCOL 3350 17 G PO PACK: 17.0000 g | PACK | Freq: Every day | ORAL | 0 refills | Status: DC

## 2016-02-05 MED ORDER — ACETAMINOPHEN 325 MG PO TABS
ORAL_TABLET | ORAL | Status: AC
  Filled 2016-02-05: qty 2

## 2016-02-05 MED ORDER — CALCIUM ACETATE (PHOS BINDER) 667 MG PO CAPS
667.0000 mg | ORAL_CAPSULE | ORAL | Status: DC | PRN
  Filled 2016-02-05: qty 1

## 2016-02-05 MED ORDER — HYDROMORPHONE HCL 1 MG/ML IJ SOLN
INTRAMUSCULAR | Status: AC
  Filled 2016-02-05: qty 1

## 2016-02-05 MED ORDER — VANCOMYCIN HCL IN DEXTROSE 1-5 GM/200ML-% IV SOLN
1000.0000 mg | INTRAVENOUS | 0 refills | Status: DC
Start: 2016-02-08 — End: 2016-02-28

## 2016-02-05 MED ORDER — SODIUM CHLORIDE 0.9 % IV BOLUS (SEPSIS): 250.0000 mL | Freq: Once | INTRAVENOUS | Status: DC

## 2016-02-05 NOTE — Progress Notes (Signed)
PROGRESS NOTE    Dale [name]  FAO:[id number] DOB: 1965/12/16 DOA: 02/02/2016 PCP: [name], MD   Outpatient Specialists:     Brief Narrative:  Dale [name] is a 50 y.o. male who presents with acute on chronic congestive heart failure secondary to fluid overload from ESRD on dialysis. Patient also with chest pain which is likely secondary to underlying pulmonary congestion. Doubt true ACS at this time but will rule out. Patient also c/o chronic N/V as well as redness on right flank   Assessment & Plan:   Principal Problem:   Chest pain at rest Active Problems:   Obstructive sleep apnea   Acute on chronic diastolic heart failure (HCC)   Pericardial effusion   Nausea   HTN (hypertension)   Coronary artery disease involving native coronary artery of native heart with unstable angina pectoris (HCC)   ESRD on dialysis (HCC)   Chronic pain   Diabetes mellitus (HCC)   FUO (fever of unknown origin)   Chest pain   Anemia of chronic disease   Diabetes mellitus with complication (HCC)   Cellulitis of right abdominal wall   Abdominal wall cellulitis   Chest pain at rest. CE stable Echo done and shows diastolic CHF  Abdominal wall cellulitis -vanc with dialysis -improving.   Hypotension, symptomatic.  Post HD, BP increasing. Monitor.  Observed overnight.    Acute on chronic diastolic heart failure. -dialysis today 125--116 kg   End-stage renal disease on dialysis. Monday Wednesday Friday schedule -Nephrology consult- for HD tomm-- plan to lower weight -Continue home meds  Anemia of chronic disease. Hemoglobin 7.9. Chart review indicates 2 weeks ago hemoglobin 9.1. Of note he underwent EGD last hospitalization revealing Normal esophagus. - Erythematous mucosa in the cardia, gastric fundus and gastric body. Normal examined duodenum. - Did not identify any high risk lesion on upper endoscopy to cause possible brisk upper GI blood loss  No specimens collected  transfusion per nephrology  Diabetes. Patient is on insulin pump at home. Serum glucose 132 on admission. Patient reports history of hypoglycemic events when in the hospital and on his pump request discontinuation of his pump -Sliding scale insulin for optimal control -Low-dose Lantus -Monitor   Hypertension. Fair control in the emergency department. -Continue home beta blocker -Monitor   Chronic pain. He is on oral Dilaudid but requesting IV Dilaudid as his " fibromyalgia pain is much worse when he is in the hospital".  -Continue gabapentin - low dose IV Dilaudid started in ER   Persistent nausea vomiting -Wife reports follow-up appointment with GI at the end of the week. This is been going on for several weeks. Home medications include Reglan, Phenergan -Patient requesting food on admission -change protonix to BID./ -Follow up with GI outpatient.   Obstructive sleep apnea. He says he was scheduled for an evaluation next week.  Morbid Obesity. BMI 41.9. -Nutritional consult   CAD. Recent non-STEMI with positive troponin. Heart cath with DES on September 8.   DVT prophylaxis:  Lovenox  Code Status: Full Code   Family Communication: patient  Disposition Plan:  Home after HD tomm   Consultants:   Cards  nephro    Subjective: Feeling really weak post HD, SBP 60 then repeated decubitus 90.. subsequently 107.    Objective: Vitals:   02/05/16 1030 02/05/16 1100 02/05/16 1140 02/05/16 1249  BP: (!) 111/51 (!) 109/52 (!) 116/40 (!) 107/49  Pulse: 82 82 80   Resp: 18 20 20    Temp:  98.4 F (36.9 C)   TempSrc:   Oral   SpO2: 99% 100% 100%   Weight:   116 kg (255 lb 11.7 oz)   Height:        Intake/Output Summary (Last 24 hours) at 02/05/16 1431 Last data filed at 02/05/16 1330  Gross per 24 hour  Intake              660 ml  Output             4000 ml  Net            -3340 ml   Filed Weights   02/05/16 0628 02/05/16 0725 02/05/16 1140    Weight: 122.4 kg (269 lb 12.8 oz) 120.3 kg (265 lb 3.4 oz) 116 kg (255 lb 11.7 oz)    Examination:  General exam: Appears calm and comfortable  Respiratory system: Clear to auscultation. Respiratory effort normal. Cardiovascular system: S1 & S2 heard, RRR. No JVD, murmurs, rubs, gallops or clicks. No pedal edema. Gastrointestinal system: +BS, multiple scars, morbidly obese Skin: faint redness on right flank Psychiatry: Judgement and insight appear normal. Mood & affect appropriate.     Data Reviewed: I have personally reviewed following labs and imaging studies  CBC:  Recent Labs Lab 02/02/16 0930 02/03/16 0227 02/04/16 0417 02/05/16 0246  WBC 5.3 5.7 6.4 5.5  NEUTROABS 3.4  --   --   --   HGB 7.9* 7.4* 7.9* 7.8*  HCT 26.4* 24.5* 26.3* 25.8*  MCV 96.0 95.3 96.3 96.3  PLT 375 343 375 245   Basic Metabolic Panel:  Recent Labs Lab 02/02/16 0930 02/04/16 0417 02/05/16 0745  NA 134* 135 132*  K 3.5 4.3 3.6  CL 94* 98* 96*  CO2 30 30 27   GLUCOSE 132* 131* 204*  BUN 14 11 9   CREATININE 5.98* 4.68* 4.61*  CALCIUM 8.4* 8.1* 8.3*  PHOS  --  2.7 2.7   GFR: Estimated Creatinine Clearance: 23.7 mL/min (by C-G formula based on SCr of 4.61 mg/dL (H)). Liver Function Tests:  Recent Labs Lab 02/04/16 0417 02/05/16 0745  ALBUMIN 2.5* 2.4*   No results for input(s): LIPASE, AMYLASE in the last 168 hours. No results for input(s): AMMONIA in the last 168 hours. Coagulation Profile: No results for input(s): INR, PROTIME in the last 168 hours. Cardiac Enzymes:  Recent Labs Lab 02/02/16 1823 02/03/16 0227  TROPONINI 0.04* 0.04*   BNP (last 3 results) No results for input(s): PROBNP in the last 8760 hours. HbA1C:  Recent Labs  02/02/16 1535  HGBA1C 5.8*   CBG:  Recent Labs Lab 02/04/16 0750 02/04/16 1324 02/04/16 1718 02/04/16 2059 02/05/16 1227  GLUCAP 120* 151* 147* 195* 85   Lipid Profile: No results for input(s): CHOL, HDL, LDLCALC, TRIG,  CHOLHDL, LDLDIRECT in the last 72 hours. Thyroid Function Tests:  Recent Labs  02/02/16 1535  TSH 4.840*   Anemia Panel:  Recent Labs  02/03/16 1454  FERRITIN 1,022*  TIBC 132*  IRON 26*   Urine analysis:    Component Value Date/Time   COLORURINE YELLOW 06/16/2013 2059   APPEARANCEUR CLEAR 06/16/2013 2059   LABSPEC 1.018 06/16/2013 2059   PHURINE 5.5 06/16/2013 2059   GLUCOSEU NEGATIVE 06/16/2013 2059   HGBUR SMALL (A) 06/16/2013 2059   BILIRUBINUR NEGATIVE 06/16/2013 2059   [place] NEGATIVE 06/16/2013 2059   PROTEINUR 100 (A) 06/16/2013 2059   UROBILINOGEN 0.2 06/16/2013 2059   NITRITE NEGATIVE 06/16/2013 2059   LEUKOCYTESUR NEGATIVE 06/16/2013 2059     )  Recent Results (from the past 240 hour(s))  Culture, blood (Routine X 2) w Reflex to ID Panel     Status: None (Preliminary result)   Collection Time: 02/03/16  1:22 PM  Result Value Ref Range Status   Specimen Description BLOOD DIALYSIS  Final   Special Requests BOTTLES DRAWN AEROBIC AND ANAEROBIC 10CC  Final   Culture NO GROWTH 2 DAYS  Final   Report Status PENDING  Incomplete  Culture, blood (Routine X 2) w Reflex to ID Panel     Status: None (Preliminary result)   Collection Time: 02/03/16  1:22 PM  Result Value Ref Range Status   Specimen Description BLOOD DIALYSIS  Final   Special Requests BOTTLES DRAWN AEROBIC AND ANAEROBIC 5CC  Final   Culture NO GROWTH 2 DAYS  Final   Report Status PENDING  Incomplete      Anti-infectives    Start     Dose/Rate Route Frequency Ordered Stop   02/08/16 0000  vancomycin (VANCOCIN) 1-5 GM/200ML-% SOLN     1,000 mg 200 mL/hr over 60 Minutes Intravenous Every M-W-F (Hemodialysis) 02/05/16 1226     02/05/16 1200  vancomycin (VANCOCIN) IVPB 1000 mg/200 mL premix     1,000 mg 200 mL/hr over 60 Minutes Intravenous Every M-W-F (Hemodialysis) 02/03/16 1159     02/05/16 0850  vancomycin (VANCOCIN) 1-5 GM/200ML-% IVPB    Comments:  Cherylann Banas   : cabinet override       02/05/16 0850 02/05/16 1036   02/04/16 1029  vancomycin (VANCOCIN) 1-5 GM/200ML-% IVPB    Comments:  Cherylann Banas   : cabinet override      02/04/16 1029 02/04/16 1131   02/04/16 1000  vancomycin (VANCOCIN) IVPB 1000 mg/200 mL premix     1,000 mg 200 mL/hr over 60 Minutes Intravenous  Once 02/04/16 0838 02/04/16 1230   02/03/16 1300  vancomycin (VANCOCIN) 2,500 mg in sodium chloride 0.9 % 250 mL IVPB     2,500 mg 250 mL/hr over 60 Minutes Intravenous  Once 02/03/16 1159 02/03/16 1713   02/03/16 1200  vancomycin (VANCOCIN) 2,500 mg in sodium chloride 0.9 % 500 mL IVPB  Status:  Discontinued     2,500 mg 250 mL/hr over 120 Minutes Intravenous  Once 02/03/16 1120 02/03/16 1159       Radiology Studies: Ct Abdomen Pelvis Wo Contrast  Result Date: 02/03/2016 CLINICAL DATA:  Acute onset of right-sided abdominal wall cellulitis, with pain and nausea. Initial encounter. EXAM: CT ABDOMEN AND PELVIS WITHOUT CONTRAST TECHNIQUE: Multidetector CT imaging of the abdomen and pelvis was performed following the standard protocol without IV contrast. COMPARISON:  CT of the abdomen and pelvis from 01/19/2016 FINDINGS: Lower chest: The visualized lung bases are grossly clear. Diffuse coronary artery calcifications are seen. Hepatobiliary: The liver is unremarkable in appearance. Mild fatty infiltration is noted about the falciform ligament. The gallbladder is unremarkable in appearance. The common bile duct remains normal in caliber. Pancreas: The pancreas is within normal limits. Spleen: The spleen is unremarkable in appearance. Adrenals/Urinary Tract: The adrenal glands are unremarkable in appearance. Severe bilateral native renal atrophy is noted. There is no evidence of hydronephrosis. No renal or ureteral stones are identified. No perinephric stranding is seen. The transplant kidney at the left iliac fossa is similar in appearance, though it demonstrates perhaps slightly increased surrounding inflammation.  Underlying infection cannot be entirely excluded. Alternatively, this may reflect previously described transplant failure, given effacement of the renal sinus fat. Stomach/Bowel: The stomach is unremarkable  in appearance. The small bowel is within normal limits. The patient is status post appendectomy. The colon is unremarkable in appearance. Vascular/Lymphatic: Scattered calcification is seen along the abdominal aorta and its branches. The abdominal aorta is otherwise grossly unremarkable. The inferior vena cava is grossly unremarkable. Visualized retroperitoneal nodes remain borderline normal in size. No pelvic sidewall lymphadenopathy is identified. An enlarged right inguinal node is noted, measuring 1.8 cm in short axis. The previously noted left inguinal hematoma has decreased in size, measuring 3.8 x 1.6 cm. Reproductive: The bladder is decompressed and not well assessed. The prostate remains normal in size. Other: Soft tissue inflammation and skin thickening is noted along the right anterior lateral abdominal wall. There is also mild soft tissue inflammation and skin thickening at the pannus. Musculoskeletal: No acute osseous abnormalities are identified. The visualized musculature is unremarkable in appearance. IMPRESSION: 1. Soft tissue inflammation and skin thickening along the right anterior lateral abdominal wall. Mild soft tissue inflammation and skin thickening at the pannus. Findings reflect the patient's known cellulitis. No evidence of abscess. 2. Mildly increased inflammation about the transplant kidney at the left iliac fossa. Underlying infection cannot be entirely excluded. Alternatively, this may reflect previously described transplant failure, given effacement of the renal sinus fat. 3. Diffuse coronary artery calcifications seen. 4. Scattered aortic atherosclerosis noted. 5. Severe bilateral native renal atrophy noted. 6. Enlarged right inguinal node, measuring 1.8 cm in short axis. 7.  Interval decrease in size of left inguinal hematoma, measuring 3.8 x 1.6 cm. Electronically Signed   By: Garald Balding M.D.   On: 02/03/2016 22:20        Scheduled Meds: . albuterol  2.5 mg Nebulization BID  . allopurinol  200 mg Oral Daily  . aspirin EC  81 mg Oral QHS  . atorvastatin  80 mg Oral q1800  . calcium acetate  2,001 mg Oral TID WC  . darbepoetin (ARANESP) injection - DIALYSIS  200 mcg Intravenous Q Wed-HD  . doxercalciferol  3 mcg Intravenous Q M,W,F-HD  . enoxaparin (LOVENOX) injection  40 mg Subcutaneous Q24H  . feeding supplement (NEPRO CARB STEADY)  237 mL Oral BID BM  . fenofibrate  54 mg Oral Daily  . Ferric Citrate  2 g Oral TID WC  . ferric gluconate (FERRLECIT/NULECIT) IV  62.5 mg Intravenous Q Wed-HD  . gabapentin  300 mg Oral TID  . insulin aspart  0-5 Units Subcutaneous QHS  . insulin aspart  0-9 Units Subcutaneous TID WC  . insulin glargine  7 Units Subcutaneous QHS  . levothyroxine  100 mcg Oral QHS  . metoCLOPramide  5 mg Oral TID PC & HS  . metoprolol tartrate  25 mg Oral BID  . midodrine  20 mg Oral Q M,W,F  . multivitamin  1 tablet Oral QHS  . pantoprazole  40 mg Oral BID  . polyethylene glycol  17 g Oral Daily  . rOPINIRole  2 mg Oral Q supper  . sodium chloride  250 mL Intravenous Once  . vancomycin  1,000 mg Intravenous Q M,W,F-HD   Continuous Infusions:    LOS: 1 day    Time spent: 25 min    Forney Kleinpeter A, DO [facility] Pager [phone]  If 7PM-7AM, please contact night-coverage [email or web address] Password [email or web address] 02/05/2016, 2:31 PM

## 2016-02-05 NOTE — Procedures (Signed)
I have personally attended this patient's dialysis session.   3rd serial HD with progressive lowering of EDW Pre weight today 120.3 with 3-4 liter goal. 4K bath for K of 3.6.   [name], MD [place] Pager 02/05/2016, 10:18 AM

## 2016-02-05 NOTE — Progress Notes (Signed)
Dale [name] Progress Note   Subjective/Interval History:   Dialysis yesterday (2nd in a row with weight down to 122.5) Getting HD again today - this is regular schedule  Subjective: "They weren't getting all my fluid off and it all built up on me. Seen on HD. Tolerating well. No C/Os this AM. Napping at intervals.   Objective Vitals:   02/04/16 1230 02/04/16 1327 02/04/16 2100 02/05/16 0628  BP: (!) 132/59 (!) 113/57 136/61 (!) 123/59  Pulse: 86 94 91 85  Resp: 16 16 16 18   Temp: 98.1 F (36.7 C) 98 F (36.7 C) 98.1 F (36.7 C) 98.5 F (36.9 C)  TempSrc: Oral Oral Oral Oral  SpO2: 100% 95% 100% 100%  Weight:    122.4 kg (269 lb 12.8 oz)  Height:       Physical Exam General: Well nourished, NAD, very pleasant Heart: S1,S2, RRR Lungs:Bilateral breath sounds CTA anteriorly Abdomen: Still has edema down side and across abdomen. Active BS.  Extremities: L BKA, RLE no edema, compression stocking.  Dialysis Access: LUA AVF cannulated at present  Additional Objective Labs: Basic Metabolic Panel:  Recent Labs Lab 01/29/16 1301 02/02/16 0930 02/04/16 0417  NA 136 134* 135  K 3.3* 3.5 4.3  CL 95* 94* 98*  CO2 34* 30 30  GLUCOSE 124* 132* 131*  BUN 6 14 11   CREATININE 3.32* 5.98* 4.68*  CALCIUM 8.1* 8.4* 8.1*  PHOS  --   --  2.7     Recent Labs Lab 02/04/16 0417  ALBUMIN 2.5*    Recent Labs Lab 01/29/16 1301 02/02/16 0930 02/03/16 0227 02/04/16 0417 02/05/16 0246  WBC 5.1 5.3 5.7 6.4 5.5  NEUTROABS  --  3.4  --   --   --   HGB 8.4 Repeated and verified X2.* 7.9* 7.4* 7.9* 7.8*  HCT 25.8 Repeated and verified X2.* 26.4* 24.5* 26.3* 25.8*  MCV 90.1 96.0 95.3 96.3 96.3  PLT 372.0 375 343 375 367   Blood Culture    Component Value Date/Time   SDES BLOOD DIALYSIS 02/03/2016 1322   SDES BLOOD DIALYSIS 02/03/2016 1322   SPECREQUEST BOTTLES DRAWN AEROBIC AND ANAEROBIC 10CC 02/03/2016 1322   SPECREQUEST BOTTLES DRAWN AEROBIC AND ANAEROBIC  5CC 02/03/2016 1322   CULT NO GROWTH < 24 HOURS 02/03/2016 1322   CULT NO GROWTH < 24 HOURS 02/03/2016 1322   REPTSTATUS PENDING 02/03/2016 1322   REPTSTATUS PENDING 02/03/2016 1322    Recent Labs Lab 02/02/16 1823 02/03/16 0227  TROPONINI 0.04* 0.04*   CBG:  Recent Labs Lab 02/03/16 2206 02/04/16 0750 02/04/16 1324 02/04/16 1718 02/04/16 2059  GLUCAP 233* 120* 151* 147* 195*     Recent Labs  02/03/16 1454  IRON 26*  TIBC 132*  FERRITIN 1,022*    Studies/Results: Ct Abdomen Pelvis Wo Contrast  Result Date: 02/03/2016 CLINICAL DATA:  Acute onset of right-sided abdominal wall cellulitis, with pain and nausea. Initial encounter. EXAM: CT ABDOMEN AND PELVIS WITHOUT CONTRAST TECHNIQUE: Multidetector CT imaging of the abdomen and pelvis was performed following the standard protocol without IV contrast. COMPARISON:  CT of the abdomen and pelvis from 01/19/2016 FINDINGS: Lower chest: The visualized lung bases are grossly clear. Diffuse coronary artery calcifications are seen. Hepatobiliary: The liver is unremarkable in appearance. Mild fatty infiltration is noted about the falciform ligament. The gallbladder is unremarkable in appearance. The common bile duct remains normal in caliber. Pancreas: The pancreas is within normal limits. Spleen: The spleen is unremarkable in appearance. Adrenals/Urinary Tract:  The adrenal glands are unremarkable in appearance. Severe bilateral native renal atrophy is noted. There is no evidence of hydronephrosis. No renal or ureteral stones are identified. No perinephric stranding is seen. The transplant kidney at the left iliac fossa is similar in appearance, though it demonstrates perhaps slightly increased surrounding inflammation. Underlying infection cannot be entirely excluded. Alternatively, this may reflect previously described transplant failure, given effacement of the renal sinus fat. Stomach/Bowel: The stomach is unremarkable in appearance. The  small bowel is within normal limits. The patient is status post appendectomy. The colon is unremarkable in appearance. Vascular/Lymphatic: Scattered calcification is seen along the abdominal aorta and its branches. The abdominal aorta is otherwise grossly unremarkable. The inferior vena cava is grossly unremarkable. Visualized retroperitoneal nodes remain borderline normal in size. No pelvic sidewall lymphadenopathy is identified. An enlarged right inguinal node is noted, measuring 1.8 cm in short axis. The previously noted left inguinal hematoma has decreased in size, measuring 3.8 x 1.6 cm. Reproductive: The bladder is decompressed and not well assessed. The prostate remains normal in size. Other: Soft tissue inflammation and skin thickening is noted along the right anterior lateral abdominal wall. There is also mild soft tissue inflammation and skin thickening at the pannus. Musculoskeletal: No acute osseous abnormalities are identified. The visualized musculature is unremarkable in appearance. IMPRESSION: 1. Soft tissue inflammation and skin thickening along the right anterior lateral abdominal wall. Mild soft tissue inflammation and skin thickening at the pannus. Findings reflect the patient's known cellulitis. No evidence of abscess. 2. Mildly increased inflammation about the transplant kidney at the left iliac fossa. Underlying infection cannot be entirely excluded. Alternatively, this may reflect previously described transplant failure, given effacement of the renal sinus fat. 3. Diffuse coronary artery calcifications seen. 4. Scattered aortic atherosclerosis noted. 5. Severe bilateral native renal atrophy noted. 6. Enlarged right inguinal node, measuring 1.8 cm in short axis. 7. Interval decrease in size of left inguinal hematoma, measuring 3.8 x 1.6 cm. Electronically Signed   By: Garald Balding M.D.   On: 02/03/2016 22:20   Medications:   . albuterol  2.5 mg Nebulization BID  . allopurinol  200 mg  Oral Daily  . aspirin EC  81 mg Oral QHS  . atorvastatin  80 mg Oral q1800  . calcium acetate  2,001 mg Oral TID WC  . darbepoetin (ARANESP) injection - DIALYSIS  200 mcg Intravenous Q Wed-HD  . doxercalciferol  3 mcg Intravenous Q M,W,F-HD  . enoxaparin (LOVENOX) injection  40 mg Subcutaneous Q24H  . fenofibrate  54 mg Oral Daily  . Ferric Citrate  2 g Oral TID WC  . ferric gluconate (FERRLECIT/NULECIT) IV  62.5 mg Intravenous Q Wed-HD  . gabapentin  300 mg Oral TID  . insulin aspart  0-5 Units Subcutaneous QHS  . insulin aspart  0-9 Units Subcutaneous TID WC  . insulin glargine  7 Units Subcutaneous QHS  . levothyroxine  100 mcg Oral QHS  . metoCLOPramide  5 mg Oral TID PC & HS  . metoprolol tartrate  25 mg Oral BID  . midodrine  20 mg Oral Q M,W,F  . multivitamin  1 tablet Oral QHS  . pantoprazole  40 mg Oral Daily  . polyethylene glycol  17 g Oral Daily  . rOPINIRole  2 mg Oral Q supper  . vancomycin  1,000 mg Intravenous Q M,W,F-HD   Dialysis Orders:  Center: MWF at [facility] 4:15 hours,  EDW 126kg, will have lower EDW  2K/2.25Ca bath,  BFR 350/DFR800,  LUA AVF - Hectoral 35mcg IV q HD - Venofer 50mg  IV weekly - Mircera 215mcg IV q 2 weeks (last given 9/13)   Assessment/Plan: 1. Chest pain: Trops flat. Echo no vegetations. Grade 2 DD. EF 55-60%. Cardiology recommends no further work up.   2. Dyspnea/Volume overload: Has lost a substantial amount of body mass since his last admission without concurrent lowering of EDW and has accumulated large amt extra volume - in fact anasarca. Will be substantially lowering EDW. Serial HD since adm. 3rd tx today. Wt down to 122.4 today. UFG 4500. Tolerating well. BP stable at present.   3. ESRD: MWF schedule. HD today on schedule. K+ 3.6. Switch to 4.0 K bath. No HD over weekend unless acutely indicated. Next HD Monday.   4.  Anemia: Hgb 7.8 today. Started Aranesp 238mcg IV weekly. Rec'd dose 02/03/16. TSat 20. Weekly Fe. Follow HGB.    5.  Metabolic bone disease: Ca 8.4. Continue binders/VRDA. Phos  low  2.7. Decrease Ca Acetate  binders to 1 cap TID AC.   6.  Abdominal wall cellulitus - R sided primarily. CT confirms cellulitic change, no abscess seen. Vancomycin started yesterday (multiple drug allergies preclude other meds). Exam appears improved today after serial HD/ABX. [name] Kitchen   Rita H. Brown NP-C 02/05/2016, 8:01 AM  [name] [phone]  I have seen and examined this patient and agree with plan and assessment in the above note with renal recommendations/intervention highlighted. Continuing to remove volume with serial HD clinical improvement. HD today is usual day.  Khaleelah Yowell B,MD 02/05/2016 10:16 AM

## 2016-02-05 NOTE — Plan of Care (Signed)
Problem: Pain Managment: Goal: General experience of comfort will improve Outcome: Progressing Pt with chronic back pain. IV dilaudid administered x1. Pt has been pain free the remainder of shift.

## 2016-02-05 NOTE — Progress Notes (Signed)
Pharmacy Antibiotic Note  Dale [name] is a 50 y.o. male admitted on 02/02/2016 with SOB. He is ESRD on HD-MWF. He was recently admitted in September with hypoxic respiratory failure and a NSTEMI resulting in a PCI. Now he is readmitted with chest pain and SOB. He also has abdominal wall cellulitis on vancomycin  -WBC= 6.4, cultures pending, PCT 0.68>>0.91 -HD 10/11, 10/12 and 10/13 (4hr at 350/hr)  Plan: -Vancomycin 1000mg  IV with HD -Will consider a vancomycin level on Monday -Goal pre-HD vancomycin level: 15-25 mcg/ml -Monitor cultures, HD tolerance, VR as needed   Height: 5\' 8"  (172.7 cm) Weight: 255 lb 11.7 oz (116 kg) IBW/kg (Calculated) : 68.4  Temp (24hrs), Avg:98.4 F (36.9 C), Min:98.1 F (36.7 C), Max:98.5 F (36.9 C)   Recent Labs Lab 02/02/16 0930 02/03/16 0227 02/04/16 0417 02/05/16 0246 02/05/16 0745  WBC 5.3 5.7 6.4 5.5  --   CREATININE 5.98*  --  4.68*  --  4.61*    Estimated Creatinine Clearance: 23.7 mL/min (by C-G formula based on SCr of 4.61 mg/dL (H)).    Allergies  Allergen Reactions  . Contrast Media [Iodinated Diagnostic Agents] Rash and Itching    Systemic itching and transient rash appearance within hours of receiving contrast  Systemic itching and transient rash appearance within hours of receiving contrast  Redman Syndrome  . Rocephin [Ceftriaxone] Itching  . Amoxicillin Itching and Rash    .Has patient had a PCN reaction causing immediate rash, facial/tongue/throat swelling, SOB or lightheadedness with hypotension: Yes Has patient had a PCN reaction causing severe rash involving mucus membranes or skin necrosis: No Has patient had a PCN reaction that required hospitalization No Has patient had a PCN reaction occurring within the last 10 years: No If all of the above answers are "NO", then may proceed with Cephalosporin use.  . Buprenorphine Hcl Nausea Only and Hives  . Clopidogrel Rash  . Hydrocodone Rash  . Morphine Nausea Only,  Rash and Hives  . Adhesive [Tape] Other (See Comments) and Itching    Blisters on arm from adhesive tape at dialysis Blisters on arm from adhesive tape at dialysis  . Prednisone Other (See Comments)    Increases sugar levels  . Ciprofloxacin Itching and Rash  . Clindamycin/Lincomycin Hives and Itching  . Codeine Hives, Itching and Rash  . Doxycycline Rash  . Enalapril Rash    cough  . Levofloxacin Hives and Itching  . Mobic [Meloxicam] Hives and Rash  . Oxycodone Rash  . Vasotec Cough    Antimicrobials this admission: 10/11 vancomycin >   Dose adjustments this admission: NA   Microbiology results: 10/11 blood cx:   Thank you for allowing pharmacy to be a part of this patient's care.  Hildred Laser, Pharm D 02/05/2016 2:51 PM

## 2016-02-06 ENCOUNTER — Inpatient Hospital Stay (HOSPITAL_COMMUNITY): Payer: Medicare Other

## 2016-02-06 LAB — GLUCOSE, CAPILLARY
Glucose-Capillary: 395 mg/dL — ABNORMAL HIGH (ref 65–99)
Glucose-Capillary: 416 mg/dL — ABNORMAL HIGH (ref 65–99)
Glucose-Capillary: 351 mg/dL — ABNORMAL HIGH (ref 65–99)
Glucose-Capillary: 309 mg/dL — ABNORMAL HIGH (ref 65–99)

## 2016-02-06 LAB — HEMOGLOBIN AND HEMATOCRIT, BLOOD
HCT: 28.9 % — ABNORMAL LOW (ref 39.0–52.0)
Hemoglobin: 8.8 g/dL — ABNORMAL LOW (ref 13.0–17.0)

## 2016-02-06 MED ORDER — PANTOPRAZOLE SODIUM 40 MG PO TBEC: 40.0000 mg | DELAYED_RELEASE_TABLET | Freq: Two times a day (BID) | ORAL | 0 refills | Status: DC

## 2016-02-06 NOTE — Progress Notes (Signed)
Pt states " not need prescriptions" ; that dialysis will provide Vancomycin,  he does have miralax at home,and his PCP will take care of protonix. D/C home with family in stable condition.

## 2016-02-06 NOTE — Progress Notes (Signed)
[facility] Progress Note   Subjective/Interval History:   Had some low BP's after HD yesterday Weight down to 116 on HD unit scales (119 bed scales today) Has had O2 desats into 80's Going for CXR  Objective Vitals:   02/05/16 2047 02/05/16 2100 02/06/16 0500 02/06/16 0858  BP:  (!) 122/56 (!) 158/57 129/67  Pulse:  88 80 75  Resp: 20 (!) 27 (!) 24 (!) 23  Temp:  99.2 F (37.3 C) 98.6 F (37 C)   TempSrc:      SpO2: 98% 100% 100% 100%  Weight:   119.1 kg (262 lb 9.6 oz)   Height:       Physical Exam Obese middle aged WM VS as noted Very pleasant but is worried Lungs with diminished BS and few crackles both bases posteriorly Regular rhythm S1S2 normal No S3 Abd protuberant with abd wall edema, cellulitis about the same right lateral abdomen RLE woody changes L BKA  Recent Labs Lab 02/02/16 0930 02/04/16 0417 02/05/16 0745  NA 134* 135 132*  K 3.5 4.3 3.6  CL 94* 98* 96*  CO2 30 30 27   GLUCOSE 132* 131* 204*  BUN 14 11 9   CREATININE 5.98* 4.68* 4.61*  CALCIUM 8.4* 8.1* 8.3*  PHOS  --  2.7 2.7     Recent Labs Lab 02/04/16 0417 02/05/16 0745  ALBUMIN 2.5* 2.4*    Recent Labs Lab 02/02/16 0930 02/03/16 0227 02/04/16 0417 02/05/16 0246 02/05/16 1942  WBC 5.3 5.7 6.4 5.5  --   NEUTROABS 3.4  --   --   --   --   HGB 7.9* 7.4* 7.9* 7.8* 9.0*  HCT 26.4* 24.5* 26.3* 25.8* 29.4*  MCV 96.0 95.3 96.3 96.3  --   PLT 375 343 375 367  --    Blood Culture    Component Value Date/Time   SDES BLOOD DIALYSIS 02/03/2016 1322   SDES BLOOD DIALYSIS 02/03/2016 1322   SPECREQUEST BOTTLES DRAWN AEROBIC AND ANAEROBIC 10CC 02/03/2016 1322   SPECREQUEST BOTTLES DRAWN AEROBIC AND ANAEROBIC 5CC 02/03/2016 1322   CULT NO GROWTH 2 DAYS 02/03/2016 1322   CULT NO GROWTH 2 DAYS 02/03/2016 1322   REPTSTATUS PENDING 02/03/2016 1322   REPTSTATUS PENDING 02/03/2016 1322    Recent Labs Lab 02/02/16 1823 02/03/16 0227  TROPONINI 0.04* 0.04*    CBG:  Recent Labs Lab 02/05/16 1649 02/05/16 1830 02/05/16 2104 02/06/16 0748 02/06/16 0750  GLUCAP 231* 226* 260* 416* 395*     Iron/TIBC/Ferritin/ %Sat    Component Value Date/Time   IRON 26 (L) 02/03/2016 1454   TIBC 132 (L) 02/03/2016 1454   FERRITIN 1,022 (H) 02/03/2016 1454   IRONPCTSAT 20 02/03/2016 1454   Medications:   . allopurinol  200 mg Oral Daily  . aspirin EC  81 mg Oral QHS  . atorvastatin  80 mg Oral q1800  . calcium acetate  2,001 mg Oral TID WC  . darbepoetin (ARANESP) injection - DIALYSIS  200 mcg Intravenous Q Wed-HD  . doxercalciferol  3 mcg Intravenous Q M,W,F-HD  . enoxaparin (LOVENOX) injection  40 mg Subcutaneous Q24H  . feeding supplement (NEPRO CARB STEADY)  237 mL Oral BID BM  . fenofibrate  54 mg Oral Daily  . ferric gluconate (FERRLECIT/NULECIT) IV  62.5 mg Intravenous Q Wed-HD  . gabapentin  300 mg Oral TID  . insulin aspart  0-5 Units Subcutaneous QHS  . insulin aspart  0-9 Units Subcutaneous TID WC  . insulin glargine  7 Units Subcutaneous QHS  . levothyroxine  100 mcg Oral QHS  . metoCLOPramide  5 mg Oral TID PC & HS  . metoprolol tartrate  25 mg Oral BID  . midodrine  20 mg Oral Q M,W,F  . multivitamin  1 tablet Oral QHS  . pantoprazole  40 mg Oral BID  . polyethylene glycol  17 g Oral Daily  . rOPINIRole  2 mg Oral Q supper  . sodium chloride  250 mL Intravenous Once  . vancomycin  1,000 mg Intravenous Q M,W,F-HD   Dialysis Orders:  Center: MWF [facility] 4:15 hours,  EDW 126kg, will have lower EDW  2K/2.25Ca bath,  BFR 350/DFR800,  LUA AVF - Hectoral 62mcg IV q HD - Venofer 50mg  IV weekly - Mircera 296mcg IV q 2 weeks (last given 9/13)   Assessment/Plan:  1. Chest pain: Trops flat. Echo no vegetations. Grade 2 DD. EF 55-60%. Cardiology recommends no further work up.   2. Dyspnea/Volume overload: Has lost a substantial amount of body mass since his last admission without concurrent lowering of EDW and has  accumulated large amt extra volume - in fact anasarca. Have substantially lowered EDW (scale variance but in HD standing post HD weight yesterday 116 kg). Anticipate when discharged will continue to lower 0.5 kg/tmt for 3-4 tmts.  3. ESRD: MWF schedule. Next HD Monday (unless CXR findings + O2 sats dictate need for additional today.   4.  Anemia: Hgb 7.8 on CBC (istat 9 probably incorrect). Started Aranesp 291mcg IV weekly. Rec'd dose 02/03/16. TSat 20. Weekly Fe. Follow HGB.   5.  Metabolic bone disease: Continue binders/VRDA. Phos was  low  2.7. Decreased Ca Acetate to 1 cap TID AC.   6.  Abdominal wall cellulitus - R sided primarily. CT confirms cellulitic change, no abscess seen. Vancomycin started 10/13 (multiple drug allergies preclude other meds). Plan 2 week course. Redness about same, tenderness less.  7. Hypoxia/low O2 sats this AM despite [place] O2. CXR pending.  Dale Maes, MD [facility] [phone] Pager 02/06/2016, 11:17 AM

## 2016-02-06 NOTE — Progress Notes (Signed)
Ss SATURATION QUALIFICATIONS: (This note is used to comply with regulatory documentation for home oxygen)  Patient Saturations on Room Air at Rest = 82%  Patient Saturations on Room Air while Ambulating = Not assessed  Patient Saturations on  2  Liters of oxygen while Ambulating =  94%   Please briefly explain why patient needs home oxygen: Pt is BKA with prosthesis and unable to ambulate far enough to assess. Desats to 60's while asleep on room air/ States he has outpatient sleep study arranged.

## 2016-02-06 NOTE — Discharge Summary (Signed)
Physician Discharge Summary  Dale [name] NLZ:[id number] DOB: 1966/01/04 DOA: 02/02/2016  PCP: [name], MD  Admit date: 02/02/2016 Discharge date: 02/06/2016  Admitted From: Home  Disposition: Home   Recommendations for Outpatient Follow-up:  1. Follow up with PCP in 1-2 weeks 2. Please obtain BMP/CBC in one week 3. Follow up with GI     Equipment/Devices: needs home oxygen.   Discharge Condition: Stable.  CODE STATUS: full code.  Diet recommendation: Heart Healthy   Brief/Interim Summary: Dale [name] a 50 y.o.malewho presents with acute on chronic congestive heart failure secondary to fluid overload from ESRD on dialysis. Patient also with chest pain which is likely secondary to underlying pulmonary congestion. Doubt true ACS at this time but will rule out. Patient also c/o chronic N/V as well as redness on right flank   Assessment & Plan  Chest pain at rest. CE stable Echo done and shows diastolic CHF  Abdominal wall cellulitis -vanc with dialysis -improving.   Hypoxemia;  Denies dyspnea.  Chest x ray negative for pulmonary edema. Hb stable.  After changing oxygen probe Oxygen sat was 96 % RA.  He does desat while sleeping. Will try to arrange home oxygen.   Hypotension, symptomatic.  Post HD, BP increasing. Monitor.  Observed overnight.  resolved.    Acute on chronic diastolic heart failure. -dialysis today 125--116 kg   End-stage renal disease on dialysis. Monday Wednesday Friday schedule -Nephrology consult- for HD tomm-- plan to lower weight -Continue home meds  Anemia of chronic disease. Hemoglobin 7.9. Chart review indicates 2 weeks ago hemoglobin 9.1. Of note he underwent EGD last hospitalization revealing Normal esophagus. - Erythematous mucosa in the cardia, gastric fundus and gastric body. Normal examined duodenum. - Did not identify any high risk lesion on upper endoscopy to cause possible brisk upper GI blood loss No  specimens collected  transfusion per nephrology  Diabetes. Patient is on insulin pump at home. Serum glucose 132 on admission. Patient reports history of hypoglycemic events when in the hospital and on his pump request discontinuation of his pump -Sliding scale insulin for optimal control -resume insulin pump at discharge  -Monitor   Hypertension. Fair control in the emergency department. -Continue home beta blocker -Monitor   Chronic pain. He is on oral Dilaudid but requesting IV Dilaudid as his " fibromyalgia pain is much worse when he is in the hospital".  -Continue gabapentin - resume home dose dilaudid.    Persistent nausea vomiting -Wife reports follow-up appointment with GI at the end of the week. This is been going on for several weeks. Home medications include Reglan, Phenergan -Patient requesting food on admission -change protonix to BID./ -Follow up with GI outpatient.  improved.   Obstructive sleep apnea. He says he was scheduled for an evaluation next week. Patient desat on sleep. Will order home oxygen.   Morbid Obesity. BMI 41.9. -Nutritional consult   CAD. Recent non-STEMI with positive troponin. Heart cath with DES on September 8.   Discharge Diagnoses:  Principal Problem:   Chest pain at rest Active Problems:   Obstructive sleep apnea   Acute on chronic diastolic heart failure (HCC)   Pericardial effusion   Nausea   HTN (hypertension)   Coronary artery disease involving native coronary artery of native heart with unstable angina pectoris (HCC)   ESRD on dialysis (HCC)   Chronic pain   Diabetes mellitus (HCC)   FUO (fever of unknown origin)   Chest pain   Anemia of  chronic disease   Diabetes mellitus with complication (HCC)   Cellulitis of right abdominal wall   Abdominal wall cellulitis    Discharge Instructions  Discharge Instructions    Diet - low sodium heart healthy    Complete by:  As directed    Increase activity slowly     Complete by:  As directed        Medication List    TAKE these medications   albuterol 108 (90 Base) MCG/ACT inhaler Commonly known as:  PROVENTIL HFA;VENTOLIN HFA Inhale 2 puffs into the lungs every 6 (six) hours as needed for wheezing or shortness of breath.   allopurinol 100 MG tablet Commonly known as:  ZYLOPRIM Take 200 mg by mouth daily.   aspirin EC 81 MG tablet Take 81 mg by mouth at bedtime.   atorvastatin 80 MG tablet Commonly known as:  LIPITOR Take 1 tablet (80 mg total) by mouth daily at 6 PM.   AURYXIA 1 GM 210 MG(Fe) Tabs Generic drug:  Ferric Citrate Take 2 g by mouth 3 (three) times daily with meals.   benzonatate 100 MG capsule Commonly known as:  TESSALON Take 1 capsule (100 mg total) by mouth 2 (two) times daily as needed for cough.   calcium acetate 667 MG capsule Commonly known as:  PHOSLO Take 1,334-2,001 mg by mouth See admin instructions. Take 3 capsules (2001 mg) by mouth with meals and 2 capsule (1334mg  ) with snacks   diphenhydrAMINE 25 mg capsule Commonly known as:  BENADRYL Take 1-2 capsules (25-50 mg total) by mouth every 6 (six) hours as needed for itching. What changed:  how much to take  when to take this  reasons to take this   feeding supplement (NEPRO CARB STEADY) Liqd Take 237 mLs by mouth daily.   fenofibrate 48 MG tablet Commonly known as:  TRICOR Take 1 tablet (48 mg total) by mouth daily.   fluticasone 50 MCG/ACT nasal spray Commonly known as:  FLONASE Place 1 spray into both nostrils daily.   gabapentin 300 MG capsule Commonly known as:  NEURONTIN Take 300 mg by mouth 3 (three) times daily.   GLUCAGON EMERGENCY 1 MG injection Generic drug:  glucagon Inject 1 mg into the muscle daily as needed (low blood sugar).   HYDROmorphone 2 MG tablet Commonly known as:  DILAUDID Take 1 tablet (2 mg total) by mouth 3 (three) times daily as needed for severe pain. What changed:  when to take this  additional  instructions   insulin lispro 100 UNIT/ML injection Commonly known as:  HUMALOG Inject 1-175 Units into the skin continuous. Up to 175 units continuous in pump,  Basal rates area as follows:  2400 1.5 units/hr, 0300 1.9 units/hr, 0800 1.45 units/hr, 1200 1.625 units/hr   insulin pump Soln Inject 1 each into the skin continuous. Humalog   levothyroxine 100 MCG tablet Commonly known as:  SYNTHROID, LEVOTHROID Take 100 mcg by mouth at bedtime.   metoCLOPramide 5 MG tablet Commonly known as:  REGLAN Take 2 tablets (10 mg total) by mouth 4 (four) times daily - after meals and at bedtime.   metoprolol tartrate 25 MG tablet Commonly known as:  LOPRESSOR Take 1 tablet (25 mg total) by mouth 2 (two) times daily.   midodrine 10 MG tablet Commonly known as:  PROAMATINE Take 20 mg by mouth every Monday, Wednesday, and Friday. On dialysis days   multivitamin Tabs tablet Take 1 tablet by mouth daily.   nitroGLYCERIN 0.4 MG SL  tablet Commonly known as:  NITROSTAT Place 0.4 mg under the tongue every 5 (five) minutes as needed for chest pain.   pantoprazole 40 MG tablet Commonly known as:  PROTONIX Take 1 tablet (40 mg total) by mouth 2 (two) times daily. What changed:  when to take this   polyethylene glycol packet Commonly known as:  MIRALAX / GLYCOLAX Take 17 g by mouth daily.   prasugrel 10 MG Tabs tablet Commonly known as:  EFFIENT Take 1 tablet (10 mg total) by mouth daily.   promethazine 25 MG tablet Commonly known as:  PHENERGAN Take 1 tablet (25 mg total) by mouth every 6 (six) hours as needed for nausea or vomiting. For nausea   rOPINIRole 2 MG tablet Commonly known as:  REQUIP Take 2 mg by mouth daily with supper.   SYSTANE 0.4-0.3 % Soln Generic drug:  Polyethyl Glycol-Propyl Glycol Place 1 drop into both eyes as needed (dry eyes).   vancomycin 1-5 GM/200ML-% Soln Commonly known as:  VANCOCIN Inject 200 mLs (1,000 mg total) into the vein every Monday, Wednesday,  and Friday with hemodialysis. Start taking on:  02/08/2016       Allergies  Allergen Reactions  . Contrast Media [Iodinated Diagnostic Agents] Rash and Itching    Systemic itching and transient rash appearance within hours of receiving contrast  Systemic itching and transient rash appearance within hours of receiving contrast  Redman Syndrome  . Rocephin [Ceftriaxone] Itching  . Amoxicillin Itching and Rash    .Has patient had a PCN reaction causing immediate rash, facial/tongue/throat swelling, SOB or lightheadedness with hypotension: Yes Has patient had a PCN reaction causing severe rash involving mucus membranes or skin necrosis: No Has patient had a PCN reaction that required hospitalization No Has patient had a PCN reaction occurring within the last 10 years: No If all of the above answers are "NO", then may proceed with Cephalosporin use.  . Buprenorphine Hcl Nausea Only and Hives  . Clopidogrel Rash  . Hydrocodone Rash  . Morphine Nausea Only, Rash and Hives  . Adhesive [Tape] Other (See Comments) and Itching    Blisters on arm from adhesive tape at dialysis Blisters on arm from adhesive tape at dialysis  . Prednisone Other (See Comments)    Increases sugar levels  . Ciprofloxacin Itching and Rash  . Clindamycin/Lincomycin Hives and Itching  . Codeine Hives, Itching and Rash  . Doxycycline Rash  . Enalapril Rash    cough  . Levofloxacin Hives and Itching  . Mobic [Meloxicam] Hives and Rash  . Oxycodone Rash  . Vasotec Cough    Consultations:  Nephrology    Procedures/Studies: Ct Abdomen Pelvis Wo Contrast  Result Date: 02/03/2016 CLINICAL DATA:  Acute onset of right-sided abdominal wall cellulitis, with pain and nausea. Initial encounter. EXAM: CT ABDOMEN AND PELVIS WITHOUT CONTRAST TECHNIQUE: Multidetector CT imaging of the abdomen and pelvis was performed following the standard protocol without IV contrast. COMPARISON:  CT of the abdomen and pelvis from  01/19/2016 FINDINGS: Lower chest: The visualized lung bases are grossly clear. Diffuse coronary artery calcifications are seen. Hepatobiliary: The liver is unremarkable in appearance. Mild fatty infiltration is noted about the falciform ligament. The gallbladder is unremarkable in appearance. The common bile duct remains normal in caliber. Pancreas: The pancreas is within normal limits. Spleen: The spleen is unremarkable in appearance. Adrenals/Urinary Tract: The adrenal glands are unremarkable in appearance. Severe bilateral native renal atrophy is noted. There is no evidence of hydronephrosis. No renal or  ureteral stones are identified. No perinephric stranding is seen. The transplant kidney at the left iliac fossa is similar in appearance, though it demonstrates perhaps slightly increased surrounding inflammation. Underlying infection cannot be entirely excluded. Alternatively, this may reflect previously described transplant failure, given effacement of the renal sinus fat. Stomach/Bowel: The stomach is unremarkable in appearance. The small bowel is within normal limits. The patient is status post appendectomy. The colon is unremarkable in appearance. Vascular/Lymphatic: Scattered calcification is seen along the abdominal aorta and its branches. The abdominal aorta is otherwise grossly unremarkable. The inferior vena cava is grossly unremarkable. Visualized retroperitoneal nodes remain borderline normal in size. No pelvic sidewall lymphadenopathy is identified. An enlarged right inguinal node is noted, measuring 1.8 cm in short axis. The previously noted left inguinal hematoma has decreased in size, measuring 3.8 x 1.6 cm. Reproductive: The bladder is decompressed and not well assessed. The prostate remains normal in size. Other: Soft tissue inflammation and skin thickening is noted along the right anterior lateral abdominal wall. There is also mild soft tissue inflammation and skin thickening at the pannus.  Musculoskeletal: No acute osseous abnormalities are identified. The visualized musculature is unremarkable in appearance. IMPRESSION: 1. Soft tissue inflammation and skin thickening along the right anterior lateral abdominal wall. Mild soft tissue inflammation and skin thickening at the pannus. Findings reflect the patient's known cellulitis. No evidence of abscess. 2. Mildly increased inflammation about the transplant kidney at the left iliac fossa. Underlying infection cannot be entirely excluded. Alternatively, this may reflect previously described transplant failure, given effacement of the renal sinus fat. 3. Diffuse coronary artery calcifications seen. 4. Scattered aortic atherosclerosis noted. 5. Severe bilateral native renal atrophy noted. 6. Enlarged right inguinal node, measuring 1.8 cm in short axis. 7. Interval decrease in size of left inguinal hematoma, measuring 3.8 x 1.6 cm. Electronically Signed   By: Garald Balding M.D.   On: 02/03/2016 22:20   Ct Abdomen Pelvis Wo Contrast  Result Date: 01/19/2016 CLINICAL DATA:  50 year old male with abdominal pain, nausea vomiting. Low grade fever. EXAM: CT ABDOMEN AND PELVIS WITHOUT CONTRAST TECHNIQUE: Multidetector CT imaging of the abdomen and pelvis was performed following the standard protocol without IV contrast. COMPARISON:  Abdominal CT dated 12/1915 FINDINGS: Evaluation of this exam is limited in the absence of intravenous contrast. The visualized lung bases are clear. A There is coronary vascular calcification. There is hypoattenuation of the cardiac blood pool suggestive of a degree of anemia. Clinical correlation is recommended. No intra-abdominal free air or free fluid. Focal area of hypodensity in the periphery of the left lobe of the liver along the falciform ligament appears similar to prior study and most likely represents a focal fatty infiltration. No intrahepatic biliary ductal dilatation. The gallbladder is mildly distended. No definite  calcified gallstone identified. No pericholecystic fluid. The pancreas is somewhat atrophic for the patient's age. No pancreatic ductal dilatation or peripancreatic stranding. The spleen and the adrenal glands appear unremarkable. The native kidneys are atrophic. Small bilateral renal hypodense lesions are not characterized But likely represent cysts. The visualized ureters appear unremarkable. The urinary bladder is collapsed. A left hemipelvic transplant kidney is noted which appears similar in morphology to the prior study. There is no hydronephrosis or nephrolithiasis of the transplant kidney. No peritransplant fluid collection identified. There is obliterated appearance of the renal sinus fat similar to the prior study but new compared to the CT dated 2014 concerning for failed transplant for chronic rejection. Clinical correlation is recommended. The  prostate and seminal vesicles are grossly unremarkable. Small scattered colonic diverticula without active inflammatory changes. Constipation. There is no evidence of bowel obstruction or active inflammation. Appendectomy. There is moderate aortoiliac atherosclerotic disease. The abdominal aorta and IVC are grossly unremarkable on this noncontrast study. No portal venous gas identified. There is atherosclerotic calcification of the mesentery vasculature. There is bilateral iliac artery atherosclerotic calcification. There is no retroperitoneal or mesenteric adenopathy. A 2.2 x 5.6 cm ovoid structure in the left inguinal region appears slightly larger compared to prior study. This may represent a small focal hematoma related to catheterization. Please see the aneurysm is not excluded. Duplex ultrasound may provide better evaluation. There is mild diffuse stranding and edema of the subcutaneous fat and an area of induration of the skin in the anterior pelvis. No drainable fluid collection/abscess. Right inguinal surgical clips noted. There is a small infraumbilical  hernia containing a short segment of small bowel loop without evidence of obstruction or inflammation. There is degenerative changes of the spine. No acute fracture. L1 inferior endplate Schmorl's node. IMPRESSION: No acute intra-abdominal pelvic pathology. Small left inguinal hematoma slightly increased in size compared to prior study. A pseudo aneurysm is not excluded. Duplex ultrasound may provide better evaluation. Left lower quadrant transplant kidney similar to the prior CT. There is obliteration of the renal sinus fat concerning for failed transplant. Constipation.  No bowel obstruction or active inflammation. Small scattered colonic diverticula. Diffuse subcutaneous stranding and edema. Electronically Signed   By: Anner Crete M.D.   On: 01/19/2016 21:45   Ct Abdomen Pelvis Wo Contrast  Result Date: 01/12/2016 CLINICAL DATA:  Mid abdominal pain for 2 days, initial encounter EXAM: CT ABDOMEN AND PELVIS WITHOUT CONTRAST TECHNIQUE: Multidetector CT imaging of the abdomen and pelvis was performed following the standard protocol without IV contrast. COMPARISON:  09/30/2014, 07/04/2012 FINDINGS: Lower chest: Minimal atelectatic changes are noted. No focal confluent infiltrate is seen. Coronary calcifications are noted. Hepatobiliary: No focal liver abnormality is seen. No gallstones, gallbladder wall thickening, or biliary dilatation. Pancreas: Unremarkable. No pancreatic ductal dilatation or surrounding inflammatory changes. Spleen: Normal in size without focal abnormality. Adrenals/Urinary Tract: The adrenal glands are within normal limits. The kidneys are of small and shrunken consistent with the given clinical history of end-stage renal disease. The overall appearance of the kidneys is stable from the prior exam. Left pelvic renal transplant kidney is noted. No obstructive changes are seen. Some vascular calcifications are noted. The renal sinus fat seen on the previous exam of 2014 is not visualized  on today's exam and mild perinephric inflammatory changes are noted. This is of uncertain significance as the patient is known to be on current hemodialysis. These changes may be related to chronic rejection. Clinical correlation is recommended. The bladder is decompressed. Stomach/Bowel: Scattered diverticular changes noted without evidence of diverticulitis. The appendix appears to have been surgically removed. No obstructive changes are seen. Vascular/Lymphatic: Scattered aortoiliac calcifications are noted without aneurysmal dilatation. No sizable lymphadenopathy is identified. Reproductive: Within normal limits. Other: Laxity of the anterior abdominal wall is again identified related to prior surgeries. Some soft tissue prominence is noted adjacent to the common femoral artery on the left likely related to the recent cardiac catheterization and a small subcutaneous hematoma. No pseudoaneurysm is identified. Musculoskeletal: No acute or significant osseous findings. IMPRESSION: Changes of left pelvic renal transplant although obliteration of the renal sinus fat is noted when compared with the prior exam of 2014. This is of uncertain significance but may  be related to chronic projection given the patient's current hemodialysis status. Diverticulosis without diverticulitis. Shrunken native kidneys consistent with end-stage renal disease. Changes consistent with the recent cardiac catheterization in the left groin. Small hematoma is noted measuring 3.5 cm in greatest dimension. No acute abnormality is noted. Electronically Signed   By: Inez Catalina M.D.   On: 01/12/2016 11:08   Dg Chest 2 View  Result Date: 02/02/2016 CLINICAL DATA:  Cough and shortness of breath for several hours EXAM: CHEST  2 VIEW COMPARISON:  01/28/2016 FINDINGS: Cardiac shadow remains enlarged. Mild central vascular congestion is noted without significant interstitial edema. No focal infiltrate is seen. Small bilateral pleural effusions  are noted. No acute bony abnormality is seen. IMPRESSION: Mild vascular congestion and small pleural effusions. These have increased in the interval from the prior exam. Electronically Signed   By: Inez Catalina M.D.   On: 02/02/2016 09:50   Dg Chest 2 View  Result Date: 01/19/2016 CLINICAL DATA:  Cough since 01/04/2016 EXAM: CHEST  2 VIEW COMPARISON:  01/12/2016 FINDINGS: There is mild bilateral interstitial thickening. There is no focal parenchymal opacity. There is no pleural effusion or pneumothorax. There is stable cardiomegaly. The osseous structures are unremarkable. IMPRESSION: Cardiomegaly with mild pulmonary vascular congestion. Electronically Signed   By: Kathreen Devoid   On: 01/19/2016 21:21   Dg Chest 2 View  Result Date: 01/12/2016 CLINICAL DATA:  Loss of appetite over the last 3 days, nausea, cough EXAM: CHEST  2 VIEW COMPARISON:  Chest x-ray of 01/06/2016 FINDINGS: Moderate cardiomegaly is stable. Somewhat prominent interstitial markings are noted bilaterally and on the lateral view there may be a small amount of fluid in the fissure, suggesting possible mild interstitial edema. No definite pleural effusion is seen. No bony abnormality is noted. IMPRESSION: Cardiomegaly.  Question mild interstitial edema. Electronically Signed   By: Ivar Drape M.D.   On: 01/12/2016 12:11   Ct Chest High Resolution  Result Date: 01/29/2016 CLINICAL DATA:  50 year old male with history of shortness of breath and cough since being intubated in August 2017. EXAM: CT CHEST WITHOUT CONTRAST TECHNIQUE: Multidetector CT imaging of the chest was performed following the standard protocol without intravenous contrast. High resolution imaging of the lungs, as well as inspiratory and expiratory imaging, was performed. COMPARISON:  Chest CT 10/02/2014. FINDINGS: Cardiovascular: Heart size is mildly enlarged. There is no significant pericardial fluid, thickening or pericardial calcification. There is aortic  atherosclerosis, as well as atherosclerosis of the great vessels of the mediastinum and the coronary arteries, including calcified atherosclerotic plaque in the left main, left anterior descending, left circumflex and right coronary arteries. Calcifications of the mitral annulus. Dilatation of the pulmonic trunk (3.8 cm in diameter). Mediastinum/Nodes: Multiple borderline enlarged and mildly enlarged mediastinal and hilar lymph nodes, similar to the prior examination, measuring up to 15 mm in short axis in the right paratracheal nodal stations. Esophagus is unremarkable in appearance. No axillary lymphadenopathy. Lungs/Pleura: High-resolution images demonstrates some interlobular septal thickening throughout the lungs bilaterally, as well as some very mild patchy ground-glass attenuation. There is a small amount of pleural thickening and/or trace pleural fluid bilaterally. No parenchymal banding, traction bronchiectasis or frank honeycombing is noted. Inspiratory and expiratory imaging demonstrates some very mild air trapping, indicative of small airways disease. Notably, tearing expiratory phase imaging there is severe collapse of the trachea and mainstem bronchi, indicative of severe tracheobronchomalacia. No acute consolidative airspace disease. Upper Abdomen: Focal low-attenuation in segment 4 of the liver adjacent to  the falciform ligament, most compatible with focal hepatic steatosis. Musculoskeletal: There are no aggressive appearing lytic or blastic lesions noted in the visualized portions of the skeleton. IMPRESSION: 1. Although there is some very mild patchy ground-glass attenuation and some interlobular septal thickening in the lungs, these findings are favored to reflect a background of very mild interstitial pulmonary edema, related to underlying congestive heart failure, rather than being indicative of underlying interstitial lung disease. There also trace bilateral pleural effusions. 2. Dilatation of  the pulmonic trunk (3.8 cm in diameter), concerning for underlying pulmonary arterial hypertension. 3. Severe tracheobronchomalacia. In addition, there is mild air trapping indicative of small airways disease. 4. Aortic atherosclerosis, in addition to left main and 3 vessel coronary artery disease. Please note that although the presence of coronary artery calcium documents the presence of coronary artery disease, the severity of this disease and any potential stenosis cannot be assessed on this non-gated CT examination. Assessment for potential risk factor modification, dietary therapy or pharmacologic therapy may be warranted, if clinically indicated. 5. Multiple borderline enlarged and minimally enlarged mediastinal lymph nodes, similar to the prior study, likely chronic in the setting of underlying congestive heart failure. Electronically Signed   By: Vinnie Langton M.D.   On: 01/29/2016 08:41    (Echo, Carotid, EGD, Colonoscopy, ERCP)    Subjective:   Discharge Exam: Vitals:   02/05/16 2100 02/06/16 0500  BP: (!) 122/56 (!) 158/57  Pulse: 88 80  Resp: (!) 27 (!) 24  Temp: 99.2 F (37.3 C) 98.6 F (37 C)   Vitals:   02/05/16 1506 02/05/16 2047 02/05/16 2100 02/06/16 0500  BP: (!) 148/54  (!) 122/56 (!) 158/57  Pulse: 80  88 80  Resp: 20 20 (!) 27 (!) 24  Temp: 98.7 F (37.1 C)  99.2 F (37.3 C) 98.6 F (37 C)  TempSrc: Oral     SpO2: 95% 98% 100% 100%  Weight:    119.1 kg (262 lb 9.6 oz)  Height:        General: Pt is alert, awake, not in acute distress Cardiovascular: RRR, S1/S2 +, no rubs, no gallops Respiratory: CTA bilaterally, no wheezing, no rhonchi Abdominal: Soft, NT, ND, bowel sounds + Extremities: no edema, no cyanosis    The results of significant diagnostics from this hospitalization (including imaging, microbiology, ancillary and laboratory) are listed below for reference.     Microbiology: Recent Results (from the past 240 hour(s))  Culture, blood  (Routine X 2) w Reflex to ID Panel     Status: None (Preliminary result)   Collection Time: 02/03/16  1:22 PM  Result Value Ref Range Status   Specimen Description BLOOD DIALYSIS  Final   Special Requests BOTTLES DRAWN AEROBIC AND ANAEROBIC 10CC  Final   Culture NO GROWTH 2 DAYS  Final   Report Status PENDING  Incomplete  Culture, blood (Routine X 2) w Reflex to ID Panel     Status: None (Preliminary result)   Collection Time: 02/03/16  1:22 PM  Result Value Ref Range Status   Specimen Description BLOOD DIALYSIS  Final   Special Requests BOTTLES DRAWN AEROBIC AND ANAEROBIC 5CC  Final   Culture NO GROWTH 2 DAYS  Final   Report Status PENDING  Incomplete     Labs: BNP (last 3 results)  Recent Labs  02/25/15 0003 02/02/16 0930  BNP 444.3* 1,610.9*   Basic Metabolic Panel:  Recent Labs Lab 02/02/16 0930 02/04/16 0417 02/05/16 0745  NA 134* 135 132*  K 3.5 4.3 3.6  CL 94* 98* 96*  CO2 30 30 27   GLUCOSE 132* 131* 204*  BUN 14 11 9   CREATININE 5.98* 4.68* 4.61*  CALCIUM 8.4* 8.1* 8.3*  PHOS  --  2.7 2.7   Liver Function Tests:  Recent Labs Lab 02/04/16 0417 02/05/16 0745  ALBUMIN 2.5* 2.4*   No results for input(s): LIPASE, AMYLASE in the last 168 hours. No results for input(s): AMMONIA in the last 168 hours. CBC:  Recent Labs Lab 02/02/16 0930 02/03/16 0227 02/04/16 0417 02/05/16 0246 02/05/16 1942  WBC 5.3 5.7 6.4 5.5  --   NEUTROABS 3.4  --   --   --   --   HGB 7.9* 7.4* 7.9* 7.8* 9.0*  HCT 26.4* 24.5* 26.3* 25.8* 29.4*  MCV 96.0 95.3 96.3 96.3  --   PLT 375 343 375 367  --    Cardiac Enzymes:  Recent Labs Lab 02/02/16 1823 02/03/16 0227  TROPONINI 0.04* 0.04*   BNP: Invalid input(s): POCBNP CBG:  Recent Labs Lab 02/05/16 1649 02/05/16 1830 02/05/16 2104 02/06/16 0748 02/06/16 0750  GLUCAP 231* 226* 260* 416* 395*   D-Dimer No results for input(s): DDIMER in the last 72 hours. Hgb A1c No results for input(s): HGBA1C in the last  72 hours. Lipid Profile No results for input(s): CHOL, HDL, LDLCALC, TRIG, CHOLHDL, LDLDIRECT in the last 72 hours. Thyroid function studies No results for input(s): TSH, T4TOTAL, T3FREE, THYROIDAB in the last 72 hours.  Invalid input(s): FREET3 Anemia work up  Recent Labs  02/03/16 1454  FERRITIN 1,022*  TIBC 132*  IRON 26*   Urinalysis    Component Value Date/Time   COLORURINE YELLOW 06/16/2013 2059   APPEARANCEUR CLEAR 06/16/2013 2059   LABSPEC 1.018 06/16/2013 2059   PHURINE 5.5 06/16/2013 2059   GLUCOSEU NEGATIVE 06/16/2013 2059   HGBUR SMALL (A) 06/16/2013 2059   BILIRUBINUR NEGATIVE 06/16/2013 2059   [place] NEGATIVE 06/16/2013 2059   PROTEINUR 100 (A) 06/16/2013 2059   UROBILINOGEN 0.2 06/16/2013 2059   NITRITE NEGATIVE 06/16/2013 2059   LEUKOCYTESUR NEGATIVE 06/16/2013 2059   Sepsis Labs Invalid input(s): PROCALCITONIN,  WBC,  LACTICIDVEN Microbiology Recent Results (from the past 240 hour(s))  Culture, blood (Routine X 2) w Reflex to ID Panel     Status: None (Preliminary result)   Collection Time: 02/03/16  1:22 PM  Result Value Ref Range Status   Specimen Description BLOOD DIALYSIS  Final   Special Requests BOTTLES DRAWN AEROBIC AND ANAEROBIC 10CC  Final   Culture NO GROWTH 2 DAYS  Final   Report Status PENDING  Incomplete  Culture, blood (Routine X 2) w Reflex to ID Panel     Status: None (Preliminary result)   Collection Time: 02/03/16  1:22 PM  Result Value Ref Range Status   Specimen Description BLOOD DIALYSIS  Final   Special Requests BOTTLES DRAWN AEROBIC AND ANAEROBIC 5CC  Final   Culture NO GROWTH 2 DAYS  Final   Report Status PENDING  Incomplete     Time coordinating discharge: Over 30 minutes  SIGNED:   Elmarie Shiley, MD  [facility] 02/06/2016, 8:35 AM Pager   If 7PM-7AM, please contact night-coverage [email or web address] Password [email or web address]

## 2016-02-06 NOTE — Progress Notes (Signed)
CM met with pt in room who states his wife will be providing transportation home.  Pt states [facility] is fine to provide home O2.  CM called [facility] DME rep, Reggie to please deliver the O2 tank to room for transport home.  NO other CM needs were communicated.

## 2016-02-06 NOTE — Discharge Instructions (Signed)
Hypoxemia Hypoxemia occurs when your blood does not contain enough oxygen. The body cannot work well when it does not have enough oxygen because every part of your body needs oxygen. Oxygen travels to all parts of the body through your blood. Hypoxemia can develop suddenly or can come on slowly. CAUSES Some common causes of hypoxemia include:  Long-term (chronic) lung diseases, such as chronic obstructive pulmonary disease (COPD) or interstitial lung disease.  Disorders that affect breathing at night, such as sleep apnea.  Fluid buildup in your lungs (pulmonary edema).  Lung infection (pneumonia).  Lung or throat cancer.  Abnormal blood flow that bypasses the lungs (shunt).  Certain diseasesthat affect nerves or muscles.  A collapsed lung (pneumothorax).  A blood clot in the lungs (pulmonary embolus).  Certain types of heart disease.  Slow or shallow breathing (hypoventilation).  Certain medicines.  High altitudes.  Toxic chemicals and gases. SIGNS AND SYMPTOMS Not everyone who has hypoxemia will develop symptoms. If the hypoxemia developed quickly, you will likely have symptoms such as shortness of breath. If the hypoxemia came on slowly over months or years, you may not notice any symptoms. Symptoms can include:  Shortness of breath (dyspnea).  Bluish color of the skin, lips, or nail beds.  Breathing that is fast, noisy, or shallow.  A fast heartbeat.  Feeling tired or sleepy.  Being confused or feeling anxious. DIAGNOSIS To determine if you have hypoxemia, your health care provider may perform:  A physical exam.  Blood tests.  A pulse oximetry. A sensor will be put on your finger, toe, or earlobe to measure the percent of oxygen in your blood. TREATMENT You will likely be treated with oxygen therapy. Depending on the cause of your hypoxemia, you may need oxygen for a short time (weeks or months), or you may need it indefinitely. Your health care provider  may also recommend other therapies to treat the underlying cause of your hypoxemia. HOME CARE INSTRUCTIONS  Only take over-the-counter or prescription medicines as directed by your health care provider.  Follow oxygen safety measures if you are on oxygen therapy. These may include:  Always having a backup supply of oxygen.  Not allowing anyone to smoke around oxygen.  Handling the oxygen tanks carefully and as instructed.  If you smoke, quit. Stay away from people who smoke.  Follow up with your health care provider as directed. SEEK MEDICAL CARE IF:  You have any concerns about your oxygen therapy.  You still have trouble breathing.  You become short of breath when you exercise.  You are tired when you wake up.  You have a headache when you wake up. SEEK IMMEDIATE MEDICAL CARE IF:   Your breathing gets worse.  You have new shortness of breath with normal activity.  You have a bluish color of the skin, lips, or nail beds.  You have confusion or cloudy thinking.  You cough up dark mucus.  You have chest pain.  You have a fever. MAKE SURE YOU:  Understand these instructions.  Will watch your condition.  Will get help right away if you are not doing well or get worse.   This information is not intended to replace advice given to you by your health care provider. Make sure you discuss any questions you have with your health care provider.   Document Released: 10/25/2010 Document Revised: 04/16/2013 Document Reviewed: 11/08/2012 Elsevier Interactive Patient Education 2016 [employer]   Nonspecific Chest Pain  Chest pain can be caused by  many different conditions. There is always a chance that your pain could be related to something serious, such as a heart attack or a blood clot in your lungs. Chest pain can also be caused by conditions that are not life-threatening. If you have chest pain, it is very important to follow up with your health care  provider. CAUSES  Chest pain can be caused by:  Heartburn.  Pneumonia or bronchitis.  Anxiety or stress.  Inflammation around your heart (pericarditis) or lung (pleuritis or pleurisy).  A blood clot in your lung.  A collapsed lung (pneumothorax). It can develop suddenly on its own (spontaneous pneumothorax) or from trauma to the chest.  Shingles infection (varicella-zoster virus).  Heart attack.  Damage to the bones, muscles, and cartilage that make up your chest wall. This can include:  Bruised bones due to injury.  Strained muscles or cartilage due to frequent or repeated coughing or overwork.  Fracture to one or more ribs.  Sore cartilage due to inflammation (costochondritis). RISK FACTORS  Risk factors for chest pain may include:  Activities that increase your risk for trauma or injury to your chest.  Respiratory infections or conditions that cause frequent coughing.  Medical conditions or overeating that can cause heartburn.  Heart disease or family history of heart disease.  Conditions or health behaviors that increase your risk of developing a blood clot.  Having had chicken pox (varicella zoster). SIGNS AND SYMPTOMS Chest pain can feel like:  Burning or tingling on the surface of your chest or deep in your chest.  Crushing, pressure, aching, or squeezing pain.  Dull or sharp pain that is worse when you move, cough, or take a deep breath.  Pain that is also felt in your back, neck, shoulder, or arm, or pain that spreads to any of these areas. Your chest pain may come and go, or it may stay constant. DIAGNOSIS Lab tests or other studies may be needed to find the cause of your pain. Your health care provider may have you take a test called an ambulatory ECG (electrocardiogram). An ECG records your heartbeat patterns at the time the test is performed. You may also have other tests, such as:  Transthoracic echocardiogram (TTE). During echocardiography,  sound waves are used to create a picture of all of the heart structures and to look at how blood flows through your heart.  Transesophageal echocardiogram (TEE).This is a more advanced imaging test that obtains images from inside your body. It allows your health care provider to see your heart in finer detail.  Cardiac monitoring. This allows your health care provider to monitor your heart rate and rhythm in real time.  Holter monitor. This is a portable device that records your heartbeat and can help to diagnose abnormal heartbeats. It allows your health care provider to track your heart activity for several days, if needed.  Stress tests. These can be done through exercise or by taking medicine that makes your heart beat more quickly.  Blood tests.  Imaging tests. TREATMENT  Your treatment depends on what is causing your chest pain. Treatment may include:  Medicines. These may include:  Acid blockers for heartburn.  Anti-inflammatory medicine.  Pain medicine for inflammatory conditions.  Antibiotic medicine, if an infection is present.  Medicines to dissolve blood clots.  Medicines to treat coronary artery disease.  Supportive care for conditions that do not require medicines. This may include:  Resting.  Applying heat or cold packs to injured areas.  Limiting activities  until pain decreases. HOME CARE INSTRUCTIONS  If you were prescribed an antibiotic medicine, finish it all even if you start to feel better.  Avoid any activities that bring on chest pain.  Do not use any tobacco products, including cigarettes, chewing tobacco, or electronic cigarettes. If you need help quitting, ask your health care provider.  Do not drink alcohol.  Take medicines only as directed by your health care provider.  Keep all follow-up visits as directed by your health care provider. This is important. This includes any further testing if your chest pain does not go away.  If  heartburn is the cause for your chest pain, you may be told to keep your head raised (elevated) while sleeping. This reduces the chance that acid will go from your stomach into your esophagus.  Make lifestyle changes as directed by your health care provider. These may include:  Getting regular exercise. Ask your health care provider to suggest some activities that are safe for you.  Eating a heart-healthy diet. A registered dietitian can help you to learn healthy eating options.  Maintaining a healthy weight.  Managing diabetes, if necessary.  Reducing stress. SEEK MEDICAL CARE IF:  Your chest pain does not go away after treatment.  You have a rash with blisters on your chest.  You have a fever. SEEK IMMEDIATE MEDICAL CARE IF:   Your chest pain is worse.  You have an increasing cough, or you cough up blood.  You have severe abdominal pain.  You have severe weakness.  You faint.  You have chills.  You have sudden, unexplained chest discomfort.  You have sudden, unexplained discomfort in your arms, back, neck, or jaw.  You have shortness of breath at any time.  You suddenly start to sweat, or your skin gets clammy.  You feel nauseous or you vomit.  You suddenly feel light-headed or dizzy.  Your heart begins to beat quickly, or it feels like it is skipping beats. These symptoms may represent a serious problem that is an emergency. Do not wait to see if the symptoms will go away. Get medical help right away. Call your local emergency services (911 in the [place] Do not drive yourself to the hospital.   This information is not intended to replace advice given to you by your health care provider. Make sure you discuss any questions you have with your health care provider.   Document Released: 01/19/2005 Document Revised: 05/02/2014 Document Reviewed: 11/15/2013 Elsevier Interactive Patient Education [employer].

## 2016-02-08 LAB — CULTURE, BLOOD (ROUTINE X 2)
Culture: NO GROWTH
Culture: NO GROWTH

## 2016-02-10 LAB — BARBITURATES,MS,WB/SP RFX
Amobarbital: NEGATIVE ug/mL
Barbiturates Confirmation: POSITIVE
Butabarbital: NEGATIVE ug/mL
Butalbital: NEGATIVE ug/mL
Pentobarbital: NEGATIVE ug/mL
Phenobarbital: 0.4 ug/mL
Secobarbital: NEGATIVE ug/mL

## 2016-02-10 LAB — DRUG SCREEN 10 W/CONF, SERUM
Amphetamines, IA: NEGATIVE ng/mL
Barbiturates, IA: POSITIVE ug/mL
Benzodiazepines, IA: NEGATIVE ng/mL
Cocaine & Metabolite, IA: NEGATIVE ng/mL
Methadone, IA: NEGATIVE ng/mL
Opiates, IA: POSITIVE ng/mL
Oxycodones, IA: NEGATIVE ng/mL
Phencyclidine, IA: NEGATIVE ng/mL
Propoxyphene, IA: NEGATIVE ng/mL
THC(Marijuana) Metabolite, IA: NEGATIVE ng/mL

## 2016-02-10 LAB — OPIATES,MS,WB/SP RFX
6-Acetylmorphine: NEGATIVE
Codeine: NEGATIVE ng/mL
Dihydrocodeine: NEGATIVE ng/mL
Hydrocodone: NEGATIVE ng/mL
Hydromorphone: 1.7 ng/mL
Morphine: NEGATIVE ng/mL
Opiate Confirmation: POSITIVE

## 2016-02-10 LAB — OXYCODONES,MS,WB/SP RFX
Oxycocone: NEGATIVE ng/mL
Oxycodones Confirmation: NEGATIVE
Oxymorphone: NEGATIVE ng/mL

## 2016-02-12 ENCOUNTER — Other Ambulatory Visit: Payer: Self-pay

## 2016-02-12 ENCOUNTER — Telehealth: Payer: Self-pay | Admitting: Cardiovascular Disease

## 2016-02-12 NOTE — Telephone Encounter (Signed)
New message       Request for surgical clearance:  1. What type of surgery is being performed?  fistulogram   When is this surgery scheduled?02-18-16 2. Are there any medications that need to be held prior to surgery and how long? stop effient today  Name of physician performing surgery? Dr Bridgett Larsson 3. What is your office phone and fax number?  Put it in epic please

## 2016-02-12 NOTE — Telephone Encounter (Signed)
Will forward to Dr. [name] for review and advisement.

## 2016-02-14 NOTE — Telephone Encounter (Signed)
Xience drug eluting stent placed 6 weeks ago. He cannot stop his anti-platelet therapy. If the planned procedure is emergent, he will need to be admitted and placed on IV anti-platelet therapy. I will attempt to call Dr Bridgett Larsson to discuss this on Monday. Dale [name]

## 2016-02-15 ENCOUNTER — Other Ambulatory Visit: Payer: Self-pay | Admitting: Cardiovascular Disease

## 2016-02-15 MED ORDER — NITROGLYCERIN 0.4 MG SL SUBL: 0.4000 mg | SUBLINGUAL_TABLET | SUBLINGUAL | 4 refills | Status: DC | PRN

## 2016-02-15 NOTE — Telephone Encounter (Signed)
I spoke with pt's wife and gave her information from Dr. [name].

## 2016-02-15 NOTE — Telephone Encounter (Signed)
I spoke to Dr. Bridgett Larsson. The procedure is cancelled. Do not hold Effient. cdm

## 2016-02-18 ENCOUNTER — Encounter (HOSPITAL_COMMUNITY): Admission: RE | Payer: Self-pay | Source: Ambulatory Visit

## 2016-02-18 ENCOUNTER — Ambulatory Visit (HOSPITAL_COMMUNITY): Admission: RE | Admit: 2016-02-18 | Payer: Medicare Other | Source: Ambulatory Visit | Admitting: Vascular Surgery

## 2016-02-18 DIAGNOSIS — E113532 Type 2 diabetes mellitus with proliferative diabetic retinopathy with traction retinal detachment not involving the macula, left eye: Secondary | ICD-10-CM | POA: Diagnosis not present

## 2016-02-18 DIAGNOSIS — Z961 Presence of intraocular lens: Secondary | ICD-10-CM | POA: Diagnosis not present

## 2016-02-18 DIAGNOSIS — H33102 Unspecified retinoschisis, left eye: Secondary | ICD-10-CM | POA: Diagnosis not present

## 2016-02-18 DIAGNOSIS — Z794 Long term (current) use of insulin: Secondary | ICD-10-CM | POA: Diagnosis not present

## 2016-02-18 DIAGNOSIS — H524 Presbyopia: Secondary | ICD-10-CM | POA: Diagnosis not present

## 2016-02-18 DIAGNOSIS — H52203 Unspecified astigmatism, bilateral: Secondary | ICD-10-CM | POA: Diagnosis not present

## 2016-02-18 DIAGNOSIS — E113551 Type 2 diabetes mellitus with stable proliferative diabetic retinopathy, right eye: Secondary | ICD-10-CM | POA: Diagnosis not present

## 2016-02-18 DIAGNOSIS — H18423 Band keratopathy, bilateral: Secondary | ICD-10-CM | POA: Diagnosis not present

## 2016-02-18 SURGERY — A/V SHUNTOGRAM/FISTULAGRAM
Anesthesia: LOCAL | Laterality: Left

## 2016-02-19 ENCOUNTER — Other Ambulatory Visit: Payer: Self-pay

## 2016-02-20 DIAGNOSIS — E1059 Type 1 diabetes mellitus with other circulatory complications: Secondary | ICD-10-CM | POA: Diagnosis not present

## 2016-02-23 DIAGNOSIS — T8612 Kidney transplant failure: Secondary | ICD-10-CM | POA: Diagnosis not present

## 2016-02-23 DIAGNOSIS — E1040 Type 1 diabetes mellitus with diabetic neuropathy, unspecified: Secondary | ICD-10-CM | POA: Diagnosis not present

## 2016-02-23 DIAGNOSIS — E039 Hypothyroidism, unspecified: Secondary | ICD-10-CM | POA: Diagnosis not present

## 2016-02-23 DIAGNOSIS — N186 End stage renal disease: Secondary | ICD-10-CM | POA: Diagnosis not present

## 2016-02-23 DIAGNOSIS — Z992 Dependence on renal dialysis: Secondary | ICD-10-CM | POA: Diagnosis not present

## 2016-02-23 DIAGNOSIS — E782 Mixed hyperlipidemia: Secondary | ICD-10-CM | POA: Diagnosis not present

## 2016-02-24 ENCOUNTER — Ambulatory Visit (HOSPITAL_COMMUNITY)
Admission: RE | Admit: 2016-02-24 | Discharge: 2016-02-24 | Disposition: A | Discharge status: Home / Self Care | Payer: Medicare Other | Source: Ambulatory Visit | Attending: Vascular Surgery | Admitting: Vascular Surgery

## 2016-02-24 ENCOUNTER — Encounter (HOSPITAL_COMMUNITY)
Admission: RE | Disposition: A | Discharge status: Home / Self Care | Payer: Self-pay | Source: Ambulatory Visit | Attending: Vascular Surgery

## 2016-02-24 ENCOUNTER — Telehealth: Payer: Self-pay | Admitting: Internal Medicine

## 2016-02-24 DIAGNOSIS — I2510 Atherosclerotic heart disease of native coronary artery without angina pectoris: Secondary | ICD-10-CM | POA: Insufficient documentation

## 2016-02-24 DIAGNOSIS — I5032 Chronic diastolic (congestive) heart failure: Secondary | ICD-10-CM

## 2016-02-24 DIAGNOSIS — E1122 Type 2 diabetes mellitus with diabetic chronic kidney disease: Secondary | ICD-10-CM | POA: Diagnosis not present

## 2016-02-24 DIAGNOSIS — Z940 Kidney transplant status: Secondary | ICD-10-CM | POA: Insufficient documentation

## 2016-02-24 DIAGNOSIS — N186 End stage renal disease: Secondary | ICD-10-CM

## 2016-02-24 DIAGNOSIS — E1022 Type 1 diabetes mellitus with diabetic chronic kidney disease: Secondary | ICD-10-CM | POA: Insufficient documentation

## 2016-02-24 DIAGNOSIS — Z89512 Acquired absence of left leg below knee: Secondary | ICD-10-CM | POA: Insufficient documentation

## 2016-02-24 DIAGNOSIS — E1051 Type 1 diabetes mellitus with diabetic peripheral angiopathy without gangrene: Secondary | ICD-10-CM

## 2016-02-24 DIAGNOSIS — A419 Sepsis, unspecified organism: Secondary | ICD-10-CM | POA: Diagnosis not present

## 2016-02-24 DIAGNOSIS — Z79899 Other long term (current) drug therapy: Secondary | ICD-10-CM | POA: Insufficient documentation

## 2016-02-24 DIAGNOSIS — Y832 Surgical operation with anastomosis, bypass or graft as the cause of abnormal reaction of the patient, or of later complication, without mention of misadventure at the time of the procedure: Secondary | ICD-10-CM

## 2016-02-24 DIAGNOSIS — Z794 Long term (current) use of insulin: Secondary | ICD-10-CM | POA: Insufficient documentation

## 2016-02-24 DIAGNOSIS — N2581 Secondary hyperparathyroidism of renal origin: Secondary | ICD-10-CM | POA: Diagnosis not present

## 2016-02-24 DIAGNOSIS — I25110 Atherosclerotic heart disease of native coronary artery with unstable angina pectoris: Secondary | ICD-10-CM | POA: Diagnosis not present

## 2016-02-24 DIAGNOSIS — T82858A Stenosis of vascular prosthetic devices, implants and grafts, initial encounter: Secondary | ICD-10-CM | POA: Insufficient documentation

## 2016-02-24 DIAGNOSIS — E871 Hypo-osmolality and hyponatremia: Secondary | ICD-10-CM | POA: Diagnosis not present

## 2016-02-24 DIAGNOSIS — Z6836 Body mass index (BMI) 36.0-36.9, adult: Secondary | ICD-10-CM

## 2016-02-24 DIAGNOSIS — Z7982 Long term (current) use of aspirin: Secondary | ICD-10-CM

## 2016-02-24 DIAGNOSIS — K219 Gastro-esophageal reflux disease without esophagitis: Secondary | ICD-10-CM

## 2016-02-24 DIAGNOSIS — K3184 Gastroparesis: Secondary | ICD-10-CM

## 2016-02-24 DIAGNOSIS — E1043 Type 1 diabetes mellitus with diabetic autonomic (poly)neuropathy: Secondary | ICD-10-CM | POA: Insufficient documentation

## 2016-02-24 DIAGNOSIS — T82898A Other specified complication of vascular prosthetic devices, implants and grafts, initial encounter: Secondary | ICD-10-CM | POA: Diagnosis not present

## 2016-02-24 DIAGNOSIS — E785 Hyperlipidemia, unspecified: Secondary | ICD-10-CM | POA: Insufficient documentation

## 2016-02-24 DIAGNOSIS — E1151 Type 2 diabetes mellitus with diabetic peripheral angiopathy without gangrene: Secondary | ICD-10-CM | POA: Diagnosis not present

## 2016-02-24 DIAGNOSIS — E039 Hypothyroidism, unspecified: Secondary | ICD-10-CM

## 2016-02-24 DIAGNOSIS — D638 Anemia in other chronic diseases classified elsewhere: Secondary | ICD-10-CM | POA: Diagnosis not present

## 2016-02-24 DIAGNOSIS — R6521 Severe sepsis with septic shock: Secondary | ICD-10-CM | POA: Diagnosis not present

## 2016-02-24 DIAGNOSIS — M109 Gout, unspecified: Secondary | ICD-10-CM | POA: Insufficient documentation

## 2016-02-24 DIAGNOSIS — I132 Hypertensive heart and chronic kidney disease with heart failure and with stage 5 chronic kidney disease, or end stage renal disease: Secondary | ICD-10-CM

## 2016-02-24 DIAGNOSIS — Z992 Dependence on renal dialysis: Secondary | ICD-10-CM

## 2016-02-24 DIAGNOSIS — I9589 Other hypotension: Secondary | ICD-10-CM | POA: Diagnosis not present

## 2016-02-24 DIAGNOSIS — Z9641 Presence of insulin pump (external) (internal): Secondary | ICD-10-CM | POA: Insufficient documentation

## 2016-02-24 DIAGNOSIS — R079 Chest pain, unspecified: Secondary | ICD-10-CM | POA: Diagnosis not present

## 2016-02-24 HISTORY — PX: PERIPHERAL VASCULAR CATHETERIZATION: SHX172C

## 2016-02-24 LAB — GLUCOSE, CAPILLARY
Glucose-Capillary: 331 mg/dL — ABNORMAL HIGH (ref 65–99)
Glucose-Capillary: 341 mg/dL — ABNORMAL HIGH (ref 65–99)
Glucose-Capillary: 346 mg/dL — ABNORMAL HIGH (ref 65–99)

## 2016-02-24 LAB — POCT I-STAT, CHEM 8
BUN: 45 mg/dL — ABNORMAL HIGH (ref 6–20)
Calcium, Ion: 0.87 mmol/L — CL (ref 1.15–1.40)
Chloride: 98 mmol/L — ABNORMAL LOW (ref 101–111)
Creatinine, Ser: 7.3 mg/dL — ABNORMAL HIGH (ref 0.61–1.24)
Glucose, Bld: 336 mg/dL — ABNORMAL HIGH (ref 65–99)
HCT: 36 % — ABNORMAL LOW (ref 39.0–52.0)
Hemoglobin: 12.2 g/dL — ABNORMAL LOW (ref 13.0–17.0)
Potassium: 6.2 mmol/L — ABNORMAL HIGH (ref 3.5–5.1)
Sodium: 131 mmol/L — ABNORMAL LOW (ref 135–145)
TCO2: 25 mmol/L (ref 0–100)

## 2016-02-24 SURGERY — A/V SHUNTOGRAM/FISTULAGRAM
Anesthesia: LOCAL | Laterality: Left

## 2016-02-24 MED ORDER — DIPHENHYDRAMINE HCL 50 MG/ML IJ SOLN
INTRAMUSCULAR | Status: AC
  Filled 2016-02-24: qty 1

## 2016-02-24 MED ORDER — HEPARIN SODIUM (PORCINE) 1000 UNIT/ML IJ SOLN
INTRAMUSCULAR | Status: DC | PRN
  Administered 2016-02-24: 2000 [IU] via INTRAVENOUS
  Administered 2016-02-24: 3000 [IU] via INTRAVENOUS

## 2016-02-24 MED ORDER — LIDOCAINE HCL (PF) 1 % IJ SOLN
INTRAMUSCULAR | Status: AC
  Filled 2016-02-24: qty 30

## 2016-02-24 MED ORDER — HEPARIN (PORCINE) IN NACL 2-0.9 UNIT/ML-% IJ SOLN
INTRAMUSCULAR | Status: AC
  Filled 2016-02-24: qty 500

## 2016-02-24 MED ORDER — FAMOTIDINE IN NACL 20-0.9 MG/50ML-% IV SOLN
20.0000 mg | Freq: Once | INTRAVENOUS | Status: AC
  Administered 2016-02-24: 20 mg via INTRAVENOUS

## 2016-02-24 MED ORDER — DIPHENHYDRAMINE HCL 50 MG/ML IJ SOLN
50.0000 mg | Freq: Once | INTRAMUSCULAR | Status: AC
  Administered 2016-02-24: 50 mg via INTRAVENOUS

## 2016-02-24 MED ORDER — INSULIN ASPART 100 UNIT/ML ~~LOC~~ SOLN
SUBCUTANEOUS | Status: AC
  Filled 2016-02-24: qty 1

## 2016-02-24 MED ORDER — LIDOCAINE HCL (PF) 1 % IJ SOLN
INTRAMUSCULAR | Status: DC | PRN
  Administered 2016-02-24 (×2): 5 mL

## 2016-02-24 MED ORDER — SODIUM CHLORIDE 0.9% FLUSH: 3.0000 mL | INTRAVENOUS | Status: DC | PRN

## 2016-02-24 MED ORDER — METHYLPREDNISOLONE SODIUM SUCC 125 MG IJ SOLR
INTRAMUSCULAR | Status: AC
  Filled 2016-02-24: qty 2

## 2016-02-24 MED ORDER — OXYCODONE-ACETAMINOPHEN 5-325 MG PO TABS: 1.0000 | ORAL_TABLET | ORAL | Status: DC | PRN

## 2016-02-24 MED ORDER — FENTANYL CITRATE (PF) 100 MCG/2ML IJ SOLN
INTRAMUSCULAR | Status: AC
  Filled 2016-02-24: qty 2

## 2016-02-24 MED ORDER — HEPARIN SODIUM (PORCINE) 1000 UNIT/ML IJ SOLN
INTRAMUSCULAR | Status: AC
  Filled 2016-02-24: qty 1

## 2016-02-24 MED ORDER — MORPHINE SULFATE (PF) 2 MG/ML IV SOLN: 2.0000 mg | INTRAVENOUS | Status: DC | PRN

## 2016-02-24 MED ORDER — INSULIN ASPART 100 UNIT/ML ~~LOC~~ SOLN
12.0000 [IU] | Freq: Once | SUBCUTANEOUS | Status: AC
  Administered 2016-02-24: 12 [IU] via SUBCUTANEOUS

## 2016-02-24 MED ORDER — MIDAZOLAM HCL 2 MG/2ML IJ SOLN
INTRAMUSCULAR | Status: AC
  Filled 2016-02-24: qty 2

## 2016-02-24 MED ORDER — MIDAZOLAM HCL 2 MG/2ML IJ SOLN
INTRAMUSCULAR | Status: DC | PRN
  Administered 2016-02-24 (×2): 2 mg via INTRAVENOUS

## 2016-02-24 MED ORDER — FENTANYL CITRATE (PF) 100 MCG/2ML IJ SOLN
INTRAMUSCULAR | Status: DC | PRN
  Administered 2016-02-24 (×2): 50 ug via INTRAVENOUS

## 2016-02-24 MED ORDER — METHYLPREDNISOLONE SODIUM SUCC 125 MG IJ SOLR
125.0000 mg | Freq: Once | INTRAMUSCULAR | Status: AC
  Administered 2016-02-24: 125 mg via INTRAVENOUS

## 2016-02-24 MED ORDER — FAMOTIDINE IN NACL 20-0.9 MG/50ML-% IV SOLN
INTRAVENOUS | Status: AC
  Filled 2016-02-24: qty 50

## 2016-02-24 MED ORDER — IODIXANOL 320 MG/ML IV SOLN
INTRAVENOUS | Status: DC | PRN
  Administered 2016-02-24: 105 mL via INTRAVENOUS

## 2016-02-24 SURGICAL SUPPLY — 23 items
BAG SNAP BAND KOVER 36X36 (MISCELLANEOUS) ×2 IMPLANT
BALLN ARMADA 8X80X80 (BALLOONS) ×2
BALLN MUSTANG 4.0X40 75 (BALLOONS) ×2
BALLOON ARMADA 8X80X80 (BALLOONS) IMPLANT
BALLOON MUSTANG 4.0X40 75 (BALLOONS) IMPLANT
CATH ANGIO 5F BER2 65CM (CATHETERS) ×1 IMPLANT
COVER DOME SNAP 22 D (MISCELLANEOUS) ×2 IMPLANT
COVER PRB 48X5XTLSCP FOLD TPE (BAG) ×1 IMPLANT
COVER PROBE 5X48 (BAG) ×2
DEVICE TORQUE .025-.038 (MISCELLANEOUS) ×1 IMPLANT
GUIDEWIRE ANGLED .035X150CM (WIRE) ×1 IMPLANT
KIT MICROINTRODUCER STIFF 5F (SHEATH) ×1 IMPLANT
PROTECTION STATION PRESSURIZED (MISCELLANEOUS) ×2
SHEATH PINNACLE R/O II 7F 4CM (SHEATH) ×1 IMPLANT
STATION PROTECTION PRESSURIZED (MISCELLANEOUS) ×1 IMPLANT
STENT VIABAHN 8X100X120 (Permanent Stent) ×2 IMPLANT
STENT VIABAHN 8X10X120 (Permanent Stent) IMPLANT
STOPCOCK MORSE 400PSI 3WAY (MISCELLANEOUS) ×2 IMPLANT
TRAY PV CATH (CUSTOM PROCEDURE TRAY) ×2 IMPLANT
TUBING CIL FLEX 10 FLL-RA (TUBING) ×2 IMPLANT
WIRE BENTSON .035X145CM (WIRE) ×1 IMPLANT
WIRE G V18X300CM (WIRE) ×1 IMPLANT
WIRE HI TORQ VERSACORE J 260CM (WIRE) ×1 IMPLANT

## 2016-02-24 NOTE — Telephone Encounter (Signed)
Called and spoke with pt and he stated that the only kind of machine that they have at home is the oxygen machine but he would not call here for any problems, he would call the DME for that.  He is not sure why his wife called.  He did not say that he was having any issues with this machine. Will sign off of message.

## 2016-02-24 NOTE — H&P (Signed)
HP   MRN #:  [id number]  History of Present Illness: This is a 50 y.o. male with esrd on dialysis MWF via left arm avf. Having increased clearance and swelling of arm. Presents for procedure.   Past Medical History:  Diagnosis Date  . Anemia of chronic disease   . Arthritis    "hands, right knee" (03/19/2014)  . CAD (coronary artery disease)    a. Abnl nuc ->LHC (11/15):  mid to dist Dx 80%, mid RCA 99% (functional CTO), dist RCA 80% >> PCI: balloon angioplasty to mid RCA (could not deliver stent) - staged PCI Rotoblator atherectomy/DES to [facility]. b. NSTEMI 12/2015 s/p DES to diagonal.  . Cholecystitis    a. 08/27/2011  . Chronic diastolic CHF (congestive heart failure) ([place])    a. Echo 3/13:  EF 55-60%;  b. Echo (11/15):  Mild LVH, EF 60-65%, no RWMA, Gr 1 DD, MAC, mild LAE, normal RVF, mild RAE;  c.  Echo 5/16:  severe LVH, EF 55-60%, no RWMA, Gr 1 DD, MAC, mild LAE  . Claustrophobia    when things get around his face.   . Difficult intubation    only once in 2015 at [facility] haven't had a problem since then  . ESRD (end stage renal disease) ([place])    a. 1995 s/p cadaveric transplant w/ susbequent failure after 18 yrs;  b.Dialysis initiated 07/2011 [facility] M-W-F  . Fibromyalgia   . Gastroparesis   . GERD (gastroesophageal reflux disease)   . Gout    PMH  . History of blood transfusion   . HLD (hyperlipidemia)   . Hypertension   . Hypothyroidism   . Inguinal lymphadenopathy    a. bilateral - s/p biopsy 07/2011  . Insulin dependent diabetes mellitus (HCC)    Type I  . Morbid obesity ([place])   . OSA (obstructive sleep apnea)    adjustable bed  . PAD (peripheral artery disease) (HCC)    a. s/p L BKA.  [name] Kitchen Pericardial effusion    a.  Small by CT 08/22/11;  b.  Large by CT 08/27/11. c. Not seen on 11/2015 echo.  . Pneumonia ~ 2007  . Tracheobronchomalacia     Past Surgical History:  Procedure Laterality Date  . AMPUTATION OF REPLICATED TOES Right   . ANGIOPLASTY  04/09/2014   RCA   . APPENDECTOMY  ~ 2006  . ARTERIOVENOUS GRAFT PLACEMENT Left 1993?   forearm  . ARTERIOVENOUS GRAFT PLACEMENT Right 1993?   leg  . ARTERIOVENOUS GRAFT PLACEMENT Left    Thigh  . Arteriovenous Graft Removed Left    Thigh  . AV FISTULA PLACEMENT  07/29/2011   Procedure: ARTERIOVENOUS (AV) FISTULA CREATION;  Surgeon: Mal Misty, MD;  Location: [place];  Service: Vascular;  Laterality: Right;  Brachial cephalic  . AV FISTULA PLACEMENT Left 01/20/2015   Procedure: LIGATION OF RIGHT ARTERIOVENOUS FISTULA WITH EXCISION OF ANEURYSM;  Surgeon: Angelia Mould, MD;  Location: [place];  Service: Vascular;  Laterality: Left;  . AV FISTULA PLACEMENT Left 04/30/2015   Procedure: CREATION OF LEFT ARM BRACHIO-CEPHALIC ARTERIOVENOUS (AV) FISTULA ;  Surgeon: Angelia Mould, MD;  Location: [place];  Service: Vascular;  Laterality: Left;  . AV Graft PLACEMENT Left 1993?   "attempted one in my wrist; didn't take"  . BELOW KNEE LEG AMPUTATION Left 2010  . CARDIAC CATHETERIZATION  03/19/2014  . CARDIAC CATHETERIZATION  03/19/2014   Procedure: CORONARY BALLOON ANGIOPLASTY;  Surgeon: Burnell Blanks, MD;  Location: [place] CATH LAB;  Service: Cardiovascular;;  . CARDIAC CATHETERIZATION N/A 01/01/2016   Procedure: Left Heart Cath and Coronary Angiography;  Surgeon: Troy Sine, MD;  Location: [place] CV LAB;  Service: Cardiovascular;  Laterality: N/A;  . CARDIAC CATHETERIZATION N/A 01/01/2016   Procedure: Coronary Stent Intervention;  Surgeon: Troy Sine, MD;  Location: [place] CV LAB;  Service: Cardiovascular;  Laterality: N/A;  . CARPAL TUNNEL RELEASE Bilateral after 2005  . CATARACT EXTRACTION W/ INTRAOCULAR LENS  IMPLANT, BILATERAL Bilateral 1990's  . COLONOSCOPY  08/29/2011   Procedure: COLONOSCOPY;  Surgeon: Jerene Bears, MD;  Location: [place];  Service: Gastroenterology;  Laterality: N/A;  . CORONARY ANGIOPLASTY WITH STENT PLACEMENT  04/09/2014       ptca/des mid lad   .  ESOPHAGOGASTRODUODENOSCOPY N/A 12/25/2015   Procedure: ESOPHAGOGASTRODUODENOSCOPY (EGD);  Surgeon: Mauri Pole, MD;  Location: [facility] ENDOSCOPY;  Service: Endoscopy;  Laterality: N/A;  bedside  . EYE SURGERY Bilateral    cataract  . FEMORAL ARTERY REPAIR  04/09/2014   ANGIOSEAL  . INSERTION OF DIALYSIS CATHETER  08/01/2011   Procedure: INSERTION OF DIALYSIS CATHETER;  Surgeon: Rosetta Posner, MD;  Location: [place];  Service: Vascular;  Laterality: Right;  insertion of dialysis catheter on right internal jugular vein  . INSERTION OF DIALYSIS CATHETER Right 01/20/2015   Procedure: INSERTION OF DIALYSIS CATHETER - RIGHT INTERNAL JUGULAR ;  Surgeon: Angelia Mould, MD;  Location: [place];  Service: Vascular;  Laterality: Right;  . KIDNEY TRANSPLANT  04/25/1993  . LEFT HEART CATHETERIZATION WITH CORONARY ANGIOGRAM N/A 03/19/2014   Procedure: LEFT HEART CATHETERIZATION WITH CORONARY ANGIOGRAM;  Surgeon: Burnell Blanks, MD;  Location: [facility] CATH LAB;  Service: Cardiovascular;  Laterality: N/A;  . LYMPH NODE BIOPSY  08/10/2011   Procedure: LYMPH NODE BIOPSY;  Surgeon: Merrie Roof, MD;  Location: [place];  Service: General;  Laterality: Left;  left groin lymph biopsy  . NEUROPLASTY / TRANSPOSITION ULNAR NERVE AT ELBOW Left after 2005  . PERCUTANEOUS CORONARY ROTOBLATOR INTERVENTION (PCI-R) N/A 04/09/2014   Procedure: PERCUTANEOUS CORONARY ROTOBLATOR INTERVENTION (PCI-R);  Surgeon: Burnell Blanks, MD;  Location: [facility] CATH LAB;  Service: Cardiovascular;  Laterality: N/A;  . PERIPHERAL VASCULAR CATHETERIZATION N/A 12/25/2014   Procedure: Upper Extremity Venography;  Surgeon: Conrad Haskell, MD;  Location: [place] CV LAB;  Service: Cardiovascular;  Laterality: N/A;  . PERITONEAL CATHETER INSERTION    . PERITONEAL CATHETER REMOVAL    . REVISON OF ARTERIOVENOUS FISTULA Right 1/88/4166   Procedure: PLICATION / REVISION OF ARTERIOVENOUS FISTULA;  Surgeon: Angelia Mould, MD;  Location: [place];   Service: Vascular;  Laterality: Right;  . REVISON OF ARTERIOVENOUS FISTULA Left 09/01/2015   Procedure: SUPERFICIALIZATION OF LEFT ARM BRACHIOCEPHALIC ARTERIOVENOUS FISTULA;  Surgeon: Angelia Mould, MD;  Location: [place];  Service: Vascular;  Laterality: Left;  . SHOULDER ARTHROSCOPY Left ~ 2006   frozen  . TOE AMPUTATION Bilateral after 2005  . UMBILICAL HERNIA REPAIR  1990's  . VENOGRAM Right 07/28/2011   Procedure: VENOGRAM;  Surgeon: Conrad Breckenridge Hills, MD;  Location: [facility] CATH LAB;  Service: Cardiovascular;  Laterality: Right;  . VITRECTOMY Bilateral     Allergies  Allergen Reactions  . Contrast Media [Iodinated Diagnostic Agents] Rash and Itching    Systemic itching and transient rash appearance within hours of receiving contrast  Systemic itching and transient rash appearance within hours of receiving contrast  Redman Syndrome  . Rocephin [Ceftriaxone]  Itching  . Amoxicillin Itching and Rash    .Has patient had a PCN reaction causing immediate rash, facial/tongue/throat swelling, SOB or lightheadedness with hypotension: Yes Has patient had a PCN reaction causing severe rash involving mucus membranes or skin necrosis: No Has patient had a PCN reaction that required hospitalization No Has patient had a PCN reaction occurring within the last 10 years: No If all of the above answers are "NO", then may proceed with Cephalosporin use.  . Buprenorphine Hcl Nausea Only and Hives  . Clopidogrel Rash  . Hydrocodone Rash  . Morphine Nausea Only, Rash and Hives  . Adhesive [Tape] Other (See Comments) and Itching    Blisters on arm from adhesive tape at dialysis Blisters on arm from adhesive tape at dialysis  . Brilinta [Ticagrelor] Nausea And Vomiting  . Prednisone Other (See Comments)    Increases sugar levels  . Ciprofloxacin Itching and Rash  . Clindamycin/Lincomycin Hives and Itching  . Codeine Hives, Itching and Rash  . Doxycycline Rash  . Enalapril Rash    cough  . Levofloxacin  Hives and Itching  . Mobic [Meloxicam] Hives and Rash  . Oxycodone Rash  . Vasotec Cough    Prior to Admission medications   Medication Sig Start Date End Date Taking? Authorizing Provider  allopurinol (ZYLOPRIM) 100 MG tablet Take 200 mg by mouth daily.  03/14/12  Yes Historical Provider, MD  aspirin EC 81 MG tablet Take 81 mg by mouth at bedtime.   Yes Historical Provider, MD  atorvastatin (LIPITOR) 80 MG tablet Take 1 tablet (80 mg total) by mouth daily at 6 PM. 01/04/16  Yes Florencia Reasons, MD  calcium acetate (PHOSLO) 667 MG capsule Take 667 mg by mouth See admin instructions. Take 1 capsule  by mouth with meals and 1 with snacks 09/19/12  Yes Historical Provider, MD  diphenhydrAMINE (BENADRYL) 25 mg capsule Take 1-2 capsules (25-50 mg total) by mouth every 6 (six) hours as needed for itching. Patient taking differently: Take 50 mg by mouth 2 (two) times daily as needed for itching or sleep.  04/11/14  Yes Rhonda G Barrett, PA-C  docusate sodium (COLACE) 100 MG capsule Take 100 mg by mouth 3 (three) times daily.   Yes Historical Provider, MD  fenofibrate (TRICOR) 48 MG tablet Take 1 tablet (48 mg total) by mouth daily. 02/27/15  Yes Shanker Kristeen Mans, MD  Ferric Citrate (AURYXIA) 1 GM 210 MG(Fe) TABS Take 2 g by mouth 3 (three) times daily with meals.   Yes Historical Provider, MD  fluticasone (FLONASE) 50 MCG/ACT nasal spray Place 1 spray into both nostrils daily as needed for allergies.    Yes Historical Provider, MD  gabapentin (NEURONTIN) 300 MG capsule Take 300 mg by mouth 3 (three) times daily.   Yes Historical Provider, MD  GLUCAGON EMERGENCY 1 MG injection Inject 1 mg into the muscle daily as needed (low blood sugar).  01/19/12  Yes Historical Provider, MD  HYDROmorphone (DILAUDID) 2 MG tablet Take 1 tablet (2 mg total) by mouth 3 (three) times daily as needed for severe pain. Patient taking differently: Take 2 mg by mouth 2 (two) times daily. Take 1 tablet (2 mg) by mouth daily at bedtime,  may take an additional dose during the day as needed for severe pain 11/20/14  Yes Alvia Grove, PA-C  Insulin Human (INSULIN PUMP) 100 unit/ml SOLN Inject 1 each into the skin continuous. Humalog   Yes Historical Provider, MD  insulin lispro (HUMALOG) 100 UNIT/ML  injection Inject 1-175 Units into the skin continuous. Up to 175 units continuous in pump,  Basal rates area as follows:  2400 1.5 units/hr, 0300 1.9 units/hr, 0800 1.45 units/hr, 1200 1.625 units/hr   Yes Historical Provider, MD  levothyroxine (SYNTHROID, LEVOTHROID) 100 MCG tablet Take 100 mcg by mouth at bedtime.    Yes Historical Provider, MD  loratadine (CLARITIN) 10 MG tablet Take 10 mg by mouth daily.   Yes Historical Provider, MD  metoCLOPramide (REGLAN) 5 MG tablet Take 2 tablets (10 mg total) by mouth 4 (four) times daily - after meals and at bedtime. 01/21/16  Yes Velvet Bathe, MD  metoprolol tartrate (LOPRESSOR) 25 MG tablet Take 1 tablet (25 mg total) by mouth 2 (two) times daily. 01/28/16  Yes Eileen Stanford, PA-C  midodrine (PROAMATINE) 10 MG tablet Take 20 mg by mouth every Monday, Wednesday, and Friday. On dialysis days   Yes Historical Provider, MD  multivitamin (RENA-VIT) TABS tablet Take 1 tablet by mouth daily.   Yes Historical Provider, MD  nitroGLYCERIN (NITROSTAT) 0.4 MG SL tablet Place 1 tablet (0.4 mg total) under the tongue every 5 (five) minutes as needed for chest pain. 02/15/16  Yes Burnell Blanks, MD  Nutritional Supplements (FEEDING SUPPLEMENT, NEPRO CARB STEADY,) LIQD Take 237 mLs by mouth daily. 01/04/16  Yes Florencia Reasons, MD  OXYGEN Inhale 3 L into the lungs at bedtime.   Yes Historical Provider, MD  pantoprazole (PROTONIX) 40 MG tablet Take 1 tablet (40 mg total) by mouth 2 (two) times daily. 02/06/16  Yes Belkys A Regalado, MD  Polyethyl Glycol-Propyl Glycol (SYSTANE) 0.4-0.3 % SOLN Place 1 drop into both eyes as needed (dry eyes).   Yes Historical Provider, MD  polyethylene glycol (MIRALAX /  GLYCOLAX) packet Take 17 g by mouth daily. Patient taking differently: Take 17 g by mouth daily as needed for mild constipation.  02/06/16  Yes Belkys A Regalado, MD  prasugrel (EFFIENT) 10 MG TABS tablet Take 1 tablet (10 mg total) by mouth daily. 01/28/16 04/27/16 Yes Eileen Stanford, PA-C  promethazine (PHENERGAN) 25 MG tablet Take 1 tablet (25 mg total) by mouth every 6 (six) hours as needed for nausea or vomiting. For nausea 11/27/14  Yes Liliane Shi, PA-C  rOPINIRole (REQUIP) 2 MG tablet Take 2 mg by mouth daily with supper.    Yes Historical Provider, MD  vancomycin (VANCOCIN) 1-5 GM/200ML-% SOLN Inject 200 mLs (1,000 mg total) into the vein every Monday, Wednesday, and Friday with hemodialysis. 02/08/16  Yes Belkys A Regalado, MD  albuterol (PROVENTIL HFA;VENTOLIN HFA) 108 (90 Base) MCG/ACT inhaler Inhale 2 puffs into the lungs every 6 (six) hours as needed for wheezing or shortness of breath. 01/26/16   Brand Males, MD    Social History   Social History  . Marital status: Married    Spouse name: N/A  . Number of children: N/A  . Years of education: N/A   Occupational History  . Disabled    Social History Main Topics  . Smoking status: Never Smoker  . Smokeless tobacco: Never Used  . Alcohol use Yes  . Drug use: No  . Sexual activity: Not on file   Other Topics Concern  . Not on file   Social History Narrative   Lives @ home with his wife in [place].      Family History  Problem Relation Age of Onset  . Colon polyps Father   . Diabetes Father   . Hypertension Father   .  Hypertension Mother   . Diabetes Mother   . Hyperlipidemia Mother   . Breast cancer Maternal Aunt   . Cancer Maternal Aunt     Breast and Bone  . Malignant hyperthermia Neg Hx   . Heart attack Neg Hx   . Stroke Neg Hx      Physical Examination  Vitals:   02/24/16 1254  BP: 128/60  Pulse: 95  Resp: 18  Temp: 97.8 F (36.6 C)   Body mass index is 36.92 kg/m.  General:   WDWN in NAD Gait: Not observed HENT: WNL, normocephalic Pulmonary: normal non-labored breathing Cardiac: rrr Abdomen:soft, ntnd Extremities: pulsatility in left upper arm avf Neurologic: A&O X 3;  CBC    Component Value Date/Time   WBC 5.5 02/05/2016 0246   RBC 2.68 (L) 02/05/2016 0246   HGB 12.2 (L) 02/24/2016 1330   HCT 36.0 (L) 02/24/2016 1330   PLT 367 02/05/2016 0246   MCV 96.3 02/05/2016 0246   MCH 29.1 02/05/2016 0246   MCHC 30.2 02/05/2016 0246   RDW 16.8 (H) 02/05/2016 0246   LYMPHSABS 1.1 02/02/2016 0930   MONOABS 0.4 02/02/2016 0930   EOSABS 0.4 02/02/2016 0930   BASOSABS 0.0 02/02/2016 0930    BMET    Component Value Date/Time   NA 131 (L) 02/24/2016 1330   K 6.2 (H) 02/24/2016 1330   CL 98 (L) 02/24/2016 1330   CO2 27 02/05/2016 0745   GLUCOSE 336 (H) 02/24/2016 1330   BUN 45 (H) 02/24/2016 1330   CREATININE 7.30 (H) 02/24/2016 1330   CALCIUM 8.3 (L) 02/05/2016 0745   GFRNONAA 14 (L) 02/05/2016 0745   GFRAA 16 (L) 02/05/2016 0745    COAGS: Lab Results  Component Value Date   INR 1.20 12/21/2015   INR 1.14 11/20/2014   INR 1.1 (H) 04/03/2014      ASSESSMENT/PLAN: This is a 50 y.o. male with esrd on hd via left arm avf. Having delayed clearance on dialysis with pulsatility in avf. Fistulogram today with possible intervention. Discussed risks and benefits and he agrees to proceed.  Liadan Guizar C. Donzetta Matters, MD [facility] Office: [phone] Pager: [phone]

## 2016-02-24 NOTE — Telephone Encounter (Signed)
Spoke with Mrs. Dale [name] that MR suggest ER or see PCP. Pt's wife states she will take patient to ER.  Nothing more needed at this time.

## 2016-02-24 NOTE — Progress Notes (Signed)
Dr. Donzetta Matters in, made aware of potassium 6.2
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
## 2016-02-24 NOTE — Telephone Encounter (Signed)
This is delirium. Needs to go to ER esp if acute and very concerning or see PCP ASRES,ALEHEGN, MD

## 2016-02-24 NOTE — Op Note (Signed)
    Patient name: Dale [name] MRN: [id number] DOB: 07/14/1965 Sex: male  02/24/2016 Pre-operative Diagnosis: malfunctioning left arm avf Post-operative diagnosis:  Same Surgeon:  Erlene Quan C. Donzetta Matters, MD Procedure Performed: 1.  US guided cannulation of avf 2.  Stent of left cephalic arch with 8 x 35TD viabahn 3.  Balloon angioplasty of arterial anastomosis with 42mm balloon  Indications:  50 year old white male on dialysis Monday Wednesday Friday via left brachiocephalic AV fistula. He is a malfunctioning of this fistula with slow clearance times as well as swelling of his left arm. He therefore indicated for the above operation.  Findings:  Left arm AV fistula was very diminutive at the cephalic arch and most runoff was via the central venous system and collaterals. There was also a tight stenosis at the arterial anastomosis. Following intervention there was brisk runoff centrally via the cephalic vein without filling of the central system. The stent has partially jailed the axillary vein but there should still be flow through this. The arterial anastomosis does have improved flow following angioplasty.   Procedure:  The patient was identified in the holding area and taken to room 8.  Left arm was prepped and draped in the usual sterile fashion timeout called. Following this ultrasound guidance was used to cannulate the AV fistula near the arterial anastomosis. Venogram demonstrated the above findings. We then crossed the stenosis with the catheter and Glidewire and exchanged for V 18 wire. The patient was heparinized with 5000 units. An 8 mm x 10 cm viabahn was then brought to the cephalic arch and deployed. This was then post dilated with 18 mm balloon and the millimeters balloon was also use to PTA the rest of the cephalic vein was narrowed. Completion angiogram demonstrated above findings with possible impingement on the axillary vein from the stent and a tight arterial anastomosis on retrograde  view.  The wire and catheter were removed Monocryl stitch used to close around the sheath and sheath removed. We then used micropuncture needle to stick and a retrograde fashion and wire was passed. We then exchanged for 7 French sheath. We performed retrograde angiogram demonstrating a tight stenosis. The stenosis was crossed with ber catheter and Bentson wire and balloon with 4 mm balloon. Completion angiogram demonstrated the above findings with runoff dominant via the ulnar artery to his hand and likely distal occlusion of his radial artery. This was likely a chronic finding. Wires and catheters removed for Monocryl used to close around the sheath and it too was removed. Patient tolerated procedure quite well. At completion there was a palpable thrill in the fistula that was not present preoperatively.  Complications: None immediate  Condition stable  Contrast 105 mL  I administered 49 minutes of sedation time with 4 mg Versed and 100 g of fentanyl.   Quaran Kedzierski C. Donzetta Matters, MD [facility] [place] Office: [phone] Pager: [phone]

## 2016-02-24 NOTE — Discharge Instructions (Signed)
Fistulogram, Care After °Refer to this sheet in the next few weeks. These instructions provide you with information on caring for yourself after your procedure. Your health care provider may also give you more specific instructions. Your treatment has been planned according to current medical practices, but problems sometimes occur. Call your health care provider if you have any problems or questions after your procedure. °WHAT TO EXPECT AFTER THE PROCEDURE °After your procedure, it is typical to have the following: °· A small amount of discomfort in the area where the catheters were placed. °· A small amount of bruising around the fistula. °· Sleepiness and fatigue. °HOME CARE INSTRUCTIONS °· Rest at home for the day following your procedure. °· Do not drive or operate heavy machinery while taking pain medicine. °· Take medicines only as directed by your health care provider. °· Do not take baths, swim, or use a hot tub until your health care provider approves. You may shower 24 hours after the procedure or as directed by your health care provider. °· There are many different ways to close and cover an incision, including stitches, skin glue, and adhesive strips. Follow your health care provider's instructions on: °¨ Incision care. °¨ Bandage (dressing) changes and removal. °¨ Incision closure removal. °· Monitor your dialysis fistula carefully. °SEEK MEDICAL CARE IF: °· You have drainage, redness, swelling, or pain at your catheter site. °· You have a fever. °· You have chills. °SEEK IMMEDIATE MEDICAL CARE IF: °· You feel weak. °· You have trouble balancing. °· You have trouble moving your arms or legs. °· You have problems with your speech or vision. °· You can no longer feel a vibration or buzz when you put your fingers over your dialysis fistula. °· The limb that was used for the procedure: °¨ Swells. °¨ Is painful. °¨ Is cold. °¨ Is discolored, such as blue or pale white. °  °This information is not intended  to replace advice given to you by your health care provider. Make sure you discuss any questions you have with your health care provider. °  °Document Released: 08/26/2013 Document Reviewed: 08/26/2013 °Elsevier Interactive Patient Education ©2016 [employer] ° °

## 2016-02-25 ENCOUNTER — Encounter (HOSPITAL_COMMUNITY): Payer: Self-pay | Admitting: Vascular Surgery

## 2016-02-25 ENCOUNTER — Inpatient Hospital Stay (HOSPITAL_COMMUNITY)
Admission: EM | Admit: 2016-02-25 | Discharge: 2016-02-28 | DRG: 252 | Disposition: A | Discharge status: Home / Self Care | Payer: Medicare Other | Attending: Internal Medicine | Admitting: Internal Medicine

## 2016-02-25 ENCOUNTER — Emergency Department (HOSPITAL_COMMUNITY): Payer: Medicare Other

## 2016-02-25 DIAGNOSIS — N186 End stage renal disease: Secondary | ICD-10-CM | POA: Diagnosis not present

## 2016-02-25 DIAGNOSIS — N2581 Secondary hyperparathyroidism of renal origin: Secondary | ICD-10-CM | POA: Diagnosis present

## 2016-02-25 DIAGNOSIS — E1143 Type 2 diabetes mellitus with diabetic autonomic (poly)neuropathy: Secondary | ICD-10-CM | POA: Diagnosis present

## 2016-02-25 DIAGNOSIS — E1151 Type 2 diabetes mellitus with diabetic peripheral angiopathy without gangrene: Secondary | ICD-10-CM | POA: Diagnosis present

## 2016-02-25 DIAGNOSIS — R509 Fever, unspecified: Secondary | ICD-10-CM | POA: Diagnosis not present

## 2016-02-25 DIAGNOSIS — Z888 Allergy status to other drugs, medicaments and biological substances status: Secondary | ICD-10-CM

## 2016-02-25 DIAGNOSIS — Z992 Dependence on renal dialysis: Secondary | ICD-10-CM | POA: Diagnosis not present

## 2016-02-25 DIAGNOSIS — A419 Sepsis, unspecified organism: Secondary | ICD-10-CM | POA: Diagnosis present

## 2016-02-25 DIAGNOSIS — I5032 Chronic diastolic (congestive) heart failure: Secondary | ICD-10-CM | POA: Diagnosis present

## 2016-02-25 DIAGNOSIS — K3184 Gastroparesis: Secondary | ICD-10-CM | POA: Diagnosis present

## 2016-02-25 DIAGNOSIS — Z91041 Radiographic dye allergy status: Secondary | ICD-10-CM

## 2016-02-25 DIAGNOSIS — Z7982 Long term (current) use of aspirin: Secondary | ICD-10-CM

## 2016-02-25 DIAGNOSIS — E1165 Type 2 diabetes mellitus with hyperglycemia: Secondary | ICD-10-CM | POA: Diagnosis present

## 2016-02-25 DIAGNOSIS — D638 Anemia in other chronic diseases classified elsewhere: Secondary | ICD-10-CM | POA: Diagnosis present

## 2016-02-25 DIAGNOSIS — Z9641 Presence of insulin pump (external) (internal): Secondary | ICD-10-CM | POA: Diagnosis present

## 2016-02-25 DIAGNOSIS — Z794 Long term (current) use of insulin: Secondary | ICD-10-CM

## 2016-02-25 DIAGNOSIS — Z885 Allergy status to narcotic agent status: Secondary | ICD-10-CM

## 2016-02-25 DIAGNOSIS — E1122 Type 2 diabetes mellitus with diabetic chronic kidney disease: Secondary | ICD-10-CM | POA: Diagnosis present

## 2016-02-25 DIAGNOSIS — Z79899 Other long term (current) drug therapy: Secondary | ICD-10-CM

## 2016-02-25 DIAGNOSIS — I252 Old myocardial infarction: Secondary | ICD-10-CM

## 2016-02-25 DIAGNOSIS — G4733 Obstructive sleep apnea (adult) (pediatric): Secondary | ICD-10-CM | POA: Diagnosis present

## 2016-02-25 DIAGNOSIS — E118 Type 2 diabetes mellitus with unspecified complications: Secondary | ICD-10-CM

## 2016-02-25 DIAGNOSIS — Z961 Presence of intraocular lens: Secondary | ICD-10-CM | POA: Diagnosis present

## 2016-02-25 DIAGNOSIS — E1029 Type 1 diabetes mellitus with other diabetic kidney complication: Secondary | ICD-10-CM | POA: Diagnosis not present

## 2016-02-25 DIAGNOSIS — Z881 Allergy status to other antibiotic agents status: Secondary | ICD-10-CM

## 2016-02-25 DIAGNOSIS — I10 Essential (primary) hypertension: Secondary | ICD-10-CM | POA: Diagnosis present

## 2016-02-25 DIAGNOSIS — I959 Hypotension, unspecified: Secondary | ICD-10-CM | POA: Diagnosis not present

## 2016-02-25 DIAGNOSIS — I132 Hypertensive heart and chronic kidney disease with heart failure and with stage 5 chronic kidney disease, or end stage renal disease: Secondary | ICD-10-CM | POA: Diagnosis present

## 2016-02-25 DIAGNOSIS — E875 Hyperkalemia: Secondary | ICD-10-CM | POA: Diagnosis present

## 2016-02-25 DIAGNOSIS — R079 Chest pain, unspecified: Secondary | ICD-10-CM | POA: Diagnosis not present

## 2016-02-25 DIAGNOSIS — I9589 Other hypotension: Secondary | ICD-10-CM | POA: Diagnosis present

## 2016-02-25 DIAGNOSIS — Z955 Presence of coronary angioplasty implant and graft: Secondary | ICD-10-CM

## 2016-02-25 DIAGNOSIS — E039 Hypothyroidism, unspecified: Secondary | ICD-10-CM | POA: Diagnosis present

## 2016-02-25 DIAGNOSIS — E119 Type 2 diabetes mellitus without complications: Secondary | ICD-10-CM

## 2016-02-25 DIAGNOSIS — E785 Hyperlipidemia, unspecified: Secondary | ICD-10-CM | POA: Diagnosis present

## 2016-02-25 DIAGNOSIS — G8929 Other chronic pain: Secondary | ICD-10-CM | POA: Diagnosis present

## 2016-02-25 DIAGNOSIS — Z9981 Dependence on supplemental oxygen: Secondary | ICD-10-CM

## 2016-02-25 DIAGNOSIS — M109 Gout, unspecified: Secondary | ICD-10-CM | POA: Diagnosis present

## 2016-02-25 DIAGNOSIS — Z89512 Acquired absence of left leg below knee: Secondary | ICD-10-CM

## 2016-02-25 DIAGNOSIS — Z91048 Other nonmedicinal substance allergy status: Secondary | ICD-10-CM

## 2016-02-25 DIAGNOSIS — E11649 Type 2 diabetes mellitus with hypoglycemia without coma: Secondary | ICD-10-CM | POA: Diagnosis not present

## 2016-02-25 DIAGNOSIS — I25110 Atherosclerotic heart disease of native coronary artery with unstable angina pectoris: Secondary | ICD-10-CM | POA: Diagnosis present

## 2016-02-25 DIAGNOSIS — E871 Hypo-osmolality and hyponatremia: Secondary | ICD-10-CM | POA: Diagnosis present

## 2016-02-25 DIAGNOSIS — K219 Gastro-esophageal reflux disease without esophagitis: Secondary | ICD-10-CM | POA: Diagnosis present

## 2016-02-25 DIAGNOSIS — R6520 Severe sepsis without septic shock: Secondary | ICD-10-CM | POA: Diagnosis present

## 2016-02-25 DIAGNOSIS — E1121 Type 2 diabetes mellitus with diabetic nephropathy: Secondary | ICD-10-CM

## 2016-02-25 DIAGNOSIS — M797 Fibromyalgia: Secondary | ICD-10-CM | POA: Diagnosis present

## 2016-02-25 DIAGNOSIS — R6521 Severe sepsis with septic shock: Secondary | ICD-10-CM | POA: Diagnosis not present

## 2016-02-25 LAB — BASIC METABOLIC PANEL
Anion gap: 15 (ref 5–15)
BUN: 72 mg/dL — ABNORMAL HIGH (ref 6–20)
CO2: 20 mmol/L — ABNORMAL LOW (ref 22–32)
Calcium: 8.9 mg/dL (ref 8.9–10.3)
Chloride: 93 mmol/L — ABNORMAL LOW (ref 101–111)
Creatinine, Ser: 9.5 mg/dL — ABNORMAL HIGH (ref 0.61–1.24)
GFR calc Af Amer: 7 mL/min — ABNORMAL LOW (ref >60)
GFR calc non Af Amer: 6 mL/min — ABNORMAL LOW (ref >60)
Glucose, Bld: 618 mg/dL (ref 65–99)
Potassium: 6.9 mmol/L (ref 3.5–5.1)
Sodium: 128 mmol/L — ABNORMAL LOW (ref 135–145)

## 2016-02-25 LAB — CBC WITH DIFFERENTIAL/PLATELET
Basophils Absolute: 0 10*3/uL (ref 0.0–0.1)
Basophils Relative: 0 %
Eosinophils Absolute: 0.4 10*3/uL (ref 0.0–0.7)
Eosinophils Relative: 2 %
HCT: 34.4 % — ABNORMAL LOW (ref 39.0–52.0)
Hemoglobin: 10.3 g/dL — ABNORMAL LOW (ref 13.0–17.0)
Lymphocytes Relative: 3 %
Lymphs Abs: 0.7 10*3/uL (ref 0.7–4.0)
MCH: 29.7 pg (ref 26.0–34.0)
MCHC: 29.9 g/dL — ABNORMAL LOW (ref 30.0–36.0)
MCV: 99.1 fL (ref 78.0–100.0)
Monocytes Absolute: 0.4 10*3/uL (ref 0.1–1.0)
Monocytes Relative: 2 %
Neutro Abs: 20 10*3/uL — ABNORMAL HIGH (ref 1.7–7.7)
Neutrophils Relative %: 93 %
Platelets: 390 10*3/uL (ref 150–400)
RBC: 3.47 MIL/uL — ABNORMAL LOW (ref 4.22–5.81)
RDW: 19.6 % — ABNORMAL HIGH (ref 11.5–15.5)
WBC: 21.6 10*3/uL — ABNORMAL HIGH (ref 4.0–10.5)

## 2016-02-25 LAB — CBG MONITORING, ED
Glucose-Capillary: 163 mg/dL — ABNORMAL HIGH (ref 65–99)
Glucose-Capillary: 208 mg/dL — ABNORMAL HIGH (ref 65–99)
Glucose-Capillary: 316 mg/dL — ABNORMAL HIGH (ref 65–99)

## 2016-02-25 LAB — COMPREHENSIVE METABOLIC PANEL
ALT: 40 U/L (ref 17–63)
AST: 92 U/L — ABNORMAL HIGH (ref 15–41)
Albumin: 3 g/dL — ABNORMAL LOW (ref 3.5–5.0)
Alkaline Phosphatase: 82 U/L (ref 38–126)
Anion gap: 16 — ABNORMAL HIGH (ref 5–15)
BUN: 54 mg/dL — ABNORMAL HIGH (ref 6–20)
CO2: 22 mmol/L (ref 22–32)
Calcium: 9.3 mg/dL (ref 8.9–10.3)
Chloride: 94 mmol/L — ABNORMAL LOW (ref 101–111)
Creatinine, Ser: 9.05 mg/dL — ABNORMAL HIGH (ref 0.61–1.24)
GFR calc Af Amer: 7 mL/min — ABNORMAL LOW (ref >60)
GFR calc non Af Amer: 6 mL/min — ABNORMAL LOW (ref >60)
Glucose, Bld: 204 mg/dL — ABNORMAL HIGH (ref 65–99)
Potassium: 5.6 mmol/L — ABNORMAL HIGH (ref 3.5–5.1)
Sodium: 132 mmol/L — ABNORMAL LOW (ref 135–145)
Total Bilirubin: 0.9 mg/dL (ref 0.3–1.2)
Total Protein: 7.6 g/dL (ref 6.5–8.1)

## 2016-02-25 LAB — I-STAT CG4 LACTIC ACID, ED
Lactic Acid, Venous: 3.47 mmol/L (ref 0.5–1.9)
Lactic Acid, Venous: 1.66 mmol/L (ref 0.5–1.9)

## 2016-02-25 LAB — LACTIC ACID, PLASMA
Lactic Acid, Venous: 1.6 mmol/L (ref 0.5–1.9)
Lactic Acid, Venous: 2.5 mmol/L (ref 0.5–1.9)

## 2016-02-25 LAB — PROTIME-INR
INR: 1.26
Prothrombin Time: 15.8 seconds — ABNORMAL HIGH (ref 11.4–15.2)

## 2016-02-25 LAB — I-STAT TROPONIN, ED: Troponin i, poc: 0 ng/mL (ref 0.00–0.08)

## 2016-02-25 LAB — PROCALCITONIN: Procalcitonin: 5.03 ng/mL

## 2016-02-25 LAB — APTT: aPTT: 36 seconds (ref 24–36)

## 2016-02-25 LAB — GLUCOSE, CAPILLARY: Glucose-Capillary: >600 mg/dL (ref 65–99)

## 2016-02-25 MED ORDER — FLUTICASONE PROPIONATE 50 MCG/ACT NA SUSP: 1.0000 | Freq: Every day | NASAL | Status: DC | PRN

## 2016-02-25 MED ORDER — INSULIN GLARGINE 100 UNIT/ML ~~LOC~~ SOLN
15.0000 [IU] | SUBCUTANEOUS | Status: AC
  Administered 2016-02-25: 15 [IU] via SUBCUTANEOUS
  Filled 2016-02-25: qty 0.15

## 2016-02-25 MED ORDER — ROPINIROLE HCL 1 MG PO TABS
2.0000 mg | ORAL_TABLET | Freq: Every day | ORAL | Status: DC
  Administered 2016-02-25 – 2016-02-27 (×3): 2 mg via ORAL
  Filled 2016-02-25 (×3): qty 2

## 2016-02-25 MED ORDER — CALCIUM ACETATE (PHOS BINDER) 667 MG PO CAPS
667.0000 mg | ORAL_CAPSULE | ORAL | Status: DC
  Filled 2016-02-25: qty 1

## 2016-02-25 MED ORDER — PIPERACILLIN-TAZOBACTAM 3.375 G IVPB 30 MIN
3.3750 g | Freq: Once | INTRAVENOUS | Status: AC
  Administered 2016-02-25: 3.375 g via INTRAVENOUS
  Filled 2016-02-25: qty 50

## 2016-02-25 MED ORDER — PIPERACILLIN-TAZOBACTAM 3.375 G IVPB 30 MIN: 3.3750 g | Freq: Once | INTRAVENOUS | Status: DC

## 2016-02-25 MED ORDER — SODIUM CHLORIDE 0.9 % IV BOLUS (SEPSIS)
500.0000 mL | Freq: Once | INTRAVENOUS | Status: AC
  Administered 2016-02-25: 500 mL via INTRAVENOUS

## 2016-02-25 MED ORDER — SODIUM CHLORIDE 0.9 % IV SOLN
INTRAVENOUS | Status: DC
  Administered 2016-02-26: 13.5 [IU]/h via INTRAVENOUS
  Administered 2016-02-26: 5.1 [IU]/h via INTRAVENOUS
  Administered 2016-02-26: 5.9 [IU]/h via INTRAVENOUS
  Administered 2016-02-26: 10.3 [IU]/h via INTRAVENOUS
  Administered 2016-02-26: 9.2 [IU]/h via INTRAVENOUS
  Administered 2016-02-26: 5.4 [IU]/h via INTRAVENOUS
  Filled 2016-02-25: qty 2.5

## 2016-02-25 MED ORDER — CALCIUM ACETATE 667 MG PO CAPS: 667.0000 mg | ORAL_CAPSULE | ORAL | Status: DC

## 2016-02-25 MED ORDER — PIPERACILLIN-TAZOBACTAM 3.375 G IVPB
3.3750 g | Freq: Two times a day (BID) | INTRAVENOUS | Status: DC
  Administered 2016-02-26: 3.375 g via INTRAVENOUS
  Filled 2016-02-25 (×5): qty 50

## 2016-02-25 MED ORDER — DIPHENHYDRAMINE HCL 50 MG/ML IJ SOLN
50.0000 mg | Freq: Once | INTRAMUSCULAR | Status: AC
  Administered 2016-02-25: 50 mg via INTRAVENOUS
  Filled 2016-02-25: qty 1

## 2016-02-25 MED ORDER — DEXTROSE-NACL 5-0.45 % IV SOLN: INTRAVENOUS | Status: DC

## 2016-02-25 MED ORDER — INSULIN ASPART 100 UNIT/ML ~~LOC~~ SOLN
0.0000 [IU] | Freq: Three times a day (TID) | SUBCUTANEOUS | Status: DC
  Administered 2016-02-25: 7 [IU] via SUBCUTANEOUS
  Administered 2016-02-25: 3 [IU] via SUBCUTANEOUS
  Filled 2016-02-25 (×2): qty 1

## 2016-02-25 MED ORDER — HEPARIN SODIUM (PORCINE) 5000 UNIT/ML IJ SOLN
5000.0000 [IU] | Freq: Three times a day (TID) | INTRAMUSCULAR | Status: DC
  Administered 2016-02-25 – 2016-02-28 (×8): 5000 [IU] via SUBCUTANEOUS
  Filled 2016-02-25 (×6): qty 1

## 2016-02-25 MED ORDER — MIDODRINE HCL 5 MG PO TABS
20.0000 mg | ORAL_TABLET | ORAL | Status: DC
  Administered 2016-02-26 – 2016-02-27 (×2): 20 mg via ORAL
  Filled 2016-02-25 (×2): qty 4

## 2016-02-25 MED ORDER — INSULIN ASPART 100 UNIT/ML ~~LOC~~ SOLN: 0.0000 [IU] | Freq: Every day | SUBCUTANEOUS | Status: DC

## 2016-02-25 MED ORDER — INSULIN GLARGINE 100 UNIT/ML ~~LOC~~ SOLN: 10.0000 [IU] | Freq: Every day | SUBCUTANEOUS | Status: DC

## 2016-02-25 MED ORDER — VANCOMYCIN HCL IN DEXTROSE 1-5 GM/200ML-% IV SOLN: 1000.0000 mg | Freq: Once | INTRAVENOUS | Status: DC

## 2016-02-25 MED ORDER — ALLOPURINOL 100 MG PO TABS
200.0000 mg | ORAL_TABLET | Freq: Every day | ORAL | Status: DC
  Administered 2016-02-25 – 2016-02-28 (×4): 200 mg via ORAL
  Filled 2016-02-25 (×4): qty 2

## 2016-02-25 MED ORDER — SODIUM CHLORIDE 0.9 % IV SOLN: INTRAVENOUS | Status: DC

## 2016-02-25 MED ORDER — ASPIRIN EC 81 MG PO TBEC
81.0000 mg | DELAYED_RELEASE_TABLET | Freq: Every day | ORAL | Status: DC
  Administered 2016-02-25 – 2016-02-27 (×3): 81 mg via ORAL
  Filled 2016-02-25 (×3): qty 1

## 2016-02-25 MED ORDER — LEVOTHYROXINE SODIUM 100 MCG PO TABS
100.0000 ug | ORAL_TABLET | Freq: Every day | ORAL | Status: DC
  Administered 2016-02-25: 100 ug via ORAL
  Filled 2016-02-25: qty 1

## 2016-02-25 MED ORDER — HYDROMORPHONE HCL 2 MG PO TABS: 2.0000 mg | ORAL_TABLET | Freq: Two times a day (BID) | ORAL | Status: DC

## 2016-02-25 MED ORDER — ALBUTEROL SULFATE (2.5 MG/3ML) 0.083% IN NEBU: 2.5000 mg | INHALATION_SOLUTION | Freq: Four times a day (QID) | RESPIRATORY_TRACT | Status: DC | PRN

## 2016-02-25 MED ORDER — FENOFIBRATE 54 MG PO TABS
54.0000 mg | ORAL_TABLET | Freq: Every day | ORAL | Status: DC
  Filled 2016-02-25: qty 1

## 2016-02-25 MED ORDER — SODIUM CHLORIDE 0.9 % IV BOLUS (SEPSIS)
1000.0000 mL | Freq: Once | INTRAVENOUS | Status: AC
  Administered 2016-02-25: 1000 mL via INTRAVENOUS

## 2016-02-25 MED ORDER — RENA-VITE PO TABS
1.0000 | ORAL_TABLET | Freq: Every day | ORAL | Status: DC
  Administered 2016-02-25: 1 via ORAL
  Filled 2016-02-25: qty 1

## 2016-02-25 MED ORDER — ACETAMINOPHEN 500 MG PO TABS
1000.0000 mg | ORAL_TABLET | Freq: Once | ORAL | Status: AC
  Administered 2016-02-25: 1000 mg via ORAL
  Filled 2016-02-25: qty 2

## 2016-02-25 MED ORDER — PRASUGREL HCL 10 MG PO TABS
10.0000 mg | ORAL_TABLET | Freq: Every day | ORAL | Status: DC
  Administered 2016-02-25 – 2016-02-28 (×4): 10 mg via ORAL
  Filled 2016-02-25 (×5): qty 1

## 2016-02-25 MED ORDER — METOCLOPRAMIDE HCL 10 MG PO TABS
10.0000 mg | ORAL_TABLET | Freq: Three times a day (TID) | ORAL | Status: DC
  Administered 2016-02-25 – 2016-02-28 (×10): 10 mg via ORAL
  Filled 2016-02-25 (×10): qty 1

## 2016-02-25 MED ORDER — VANCOMYCIN HCL IN DEXTROSE 1-5 GM/200ML-% IV SOLN
1000.0000 mg | INTRAVENOUS | Status: DC
  Filled 2016-02-25: qty 200

## 2016-02-25 MED ORDER — DIPHENHYDRAMINE HCL 25 MG PO CAPS
50.0000 mg | ORAL_CAPSULE | Freq: Two times a day (BID) | ORAL | Status: DC | PRN
  Administered 2016-02-25: 50 mg via ORAL
  Filled 2016-02-25: qty 2

## 2016-02-25 MED ORDER — PROMETHAZINE HCL 25 MG PO TABS
25.0000 mg | ORAL_TABLET | Freq: Four times a day (QID) | ORAL | Status: DC | PRN
  Filled 2016-02-25: qty 1

## 2016-02-25 MED ORDER — INSULIN GLARGINE 100 UNIT/ML ~~LOC~~ SOLN
15.0000 [IU] | Freq: Every day | SUBCUTANEOUS | Status: DC
  Filled 2016-02-25: qty 0.15

## 2016-02-25 MED ORDER — POLYETHYLENE GLYCOL 3350 17 G PO PACK: 17.0000 g | PACK | Freq: Every day | ORAL | Status: DC | PRN

## 2016-02-25 MED ORDER — DOXERCALCIFEROL 4 MCG/2ML IV SOLN
3.0000 ug | INTRAVENOUS | Status: DC
  Filled 2016-02-25: qty 2

## 2016-02-25 MED ORDER — VANCOMYCIN HCL 10 G IV SOLR
2000.0000 mg | Freq: Once | INTRAVENOUS | Status: AC
  Administered 2016-02-25: 2000 mg via INTRAVENOUS
  Filled 2016-02-25: qty 2000

## 2016-02-25 MED ORDER — FERRIC CITRATE 1 GM 210 MG(FE) PO TABS: 2.0000 g | ORAL_TABLET | Freq: Three times a day (TID) | ORAL | Status: DC

## 2016-02-25 MED ORDER — ATORVASTATIN CALCIUM 80 MG PO TABS
80.0000 mg | ORAL_TABLET | Freq: Every day | ORAL | Status: DC
  Administered 2016-02-25 – 2016-02-27 (×3): 80 mg via ORAL
  Filled 2016-02-25 (×3): qty 1

## 2016-02-25 MED ORDER — HYDROMORPHONE HCL 1 MG/ML IJ SOLN
0.5000 mg | INTRAMUSCULAR | Status: DC | PRN
  Administered 2016-02-25 – 2016-02-26 (×3): 0.5 mg via INTRAVENOUS
  Filled 2016-02-25 (×3): qty 1

## 2016-02-25 MED ORDER — GABAPENTIN 300 MG PO CAPS
300.0000 mg | ORAL_CAPSULE | Freq: Three times a day (TID) | ORAL | Status: DC
  Administered 2016-02-25 (×2): 300 mg via ORAL
  Filled 2016-02-25 (×2): qty 1

## 2016-02-25 MED ORDER — HYDROMORPHONE HCL 2 MG/ML IJ SOLN
0.5000 mg | INTRAMUSCULAR | Status: DC | PRN
Start: 2016-02-25 — End: 2016-02-25
  Administered 2016-02-25 (×2): 0.5 mg via INTRAVENOUS
  Filled 2016-02-25 (×2): qty 1

## 2016-02-25 MED ORDER — SODIUM CHLORIDE 0.9 % IV SOLN
INTRAVENOUS | Status: DC
  Administered 2016-02-26: 75 mL/h via INTRAVENOUS

## 2016-02-25 MED ORDER — ALBUTEROL SULFATE HFA 108 (90 BASE) MCG/ACT IN AERS: 2.0000 | INHALATION_SPRAY | Freq: Four times a day (QID) | RESPIRATORY_TRACT | Status: DC | PRN

## 2016-02-25 MED ORDER — METHYLPREDNISOLONE SODIUM SUCC 125 MG IJ SOLR
125.0000 mg | Freq: Once | INTRAMUSCULAR | Status: AC
  Administered 2016-02-25: 125 mg via INTRAVENOUS
  Filled 2016-02-25: qty 2

## 2016-02-25 MED ORDER — CALCIUM ACETATE (PHOS BINDER) 667 MG PO CAPS: 667.0000 mg | ORAL_CAPSULE | Freq: Three times a day (TID) | ORAL | Status: DC

## 2016-02-25 MED FILL — Heparin Sodium (Porcine) 2 Unit/ML in Sodium Chloride 0.9%: INTRAMUSCULAR | Qty: 500 | Status: AC

## 2016-02-25 NOTE — ED Notes (Signed)
Attempted report to 6E x1.

## 2016-02-25 NOTE — ED Provider Notes (Signed)
[facility] DEPT Provider Note   CSN: [id number] Arrival date & time: 02/25/16  2694     History   Chief Complaint Chief Complaint  Patient presents with  . Dizziness    HPI Dale [name] is a 50 y.o. male hx of CAD, CHF, ESRD on HD, HTN, Pneumonia previously here presenting with fever, hypotension, diaphoresis. Patient was in the hospital yesterday for angiogram of the left arm graft. Graft was thought to be clotted and had a balloon angioplasty of the left cephalic vein. He also was given heparin and right before the procedure. He went home the same day. Overnight, he developed chills and diaphoresis. He states that he just felt very unwell and took his blood pressure at home and was in the 70s. He also noticed that he felt warm so took his temperature and was 99 at home. Denies any cough or abdominal pain.          The history is provided by the patient.    Past Medical History:  Diagnosis Date  . Anemia of chronic disease   . Arthritis    "hands, right knee" (03/19/2014)  . CAD (coronary artery disease)    a. Abnl nuc ->LHC (11/15):  mid to dist Dx 80%, mid RCA 99% (functional CTO), dist RCA 80% >> PCI: balloon angioplasty to mid RCA (could not deliver stent) - staged PCI Rotoblator atherectomy/DES to [facility]. b. NSTEMI 12/2015 s/p DES to diagonal.  . Cholecystitis    a. 08/27/2011  . Chronic diastolic CHF (congestive heart failure) ([place])    a. Echo 3/13:  EF 55-60%;  b. Echo (11/15):  Mild LVH, EF 60-65%, no RWMA, Gr 1 DD, MAC, mild LAE, normal RVF, mild RAE;  c.  Echo 5/16:  severe LVH, EF 55-60%, no RWMA, Gr 1 DD, MAC, mild LAE  . Claustrophobia    when things get around his face.   . Difficult intubation    only once in 2015 at [facility] haven't had a problem since then  . ESRD (end stage renal disease) ([place])    a. 1995 s/p cadaveric transplant w/ susbequent failure after 18 yrs;  b.Dialysis initiated 07/2011 [facility] M-W-F  . Fibromyalgia   . Gastroparesis   .  GERD (gastroesophageal reflux disease)   . Gout    PMH  . History of blood transfusion   . HLD (hyperlipidemia)   . Hypertension   . Hypothyroidism   . Inguinal lymphadenopathy    a. bilateral - s/p biopsy 07/2011  . Insulin dependent diabetes mellitus (HCC)    Type I  . Morbid obesity ([place])   . OSA (obstructive sleep apnea)    adjustable bed  . PAD (peripheral artery disease) (HCC)    a. s/p L BKA.  [name] Kitchen Pericardial effusion    a.  Small by CT 08/22/11;  b.  Large by CT 08/27/11. c. Not seen on 11/2015 echo.  . Pneumonia ~ 2007  . Tracheobronchomalacia     Patient Active Problem List   Diagnosis Date Noted  . Tracheobronchomalacia 02/04/2016  . Abdominal wall cellulitis 02/04/2016  . Cellulitis of right abdominal wall   . Chest pain 02/02/2016  . Anemia of chronic disease   . Diabetes mellitus with complication ([place])   . FUO (fever of unknown origin) 01/26/2016  . Nausea & vomiting 01/19/2016  . Hyponatremia 01/19/2016  . Stridor   . Iron deficiency anemia   . Encounter for central line placement   . Gastrointestinal  hemorrhage with melena 12/21/2015  . Encounter for intubation   . Acute blood loss anemia   . Uremia   . Acute encephalopathy   . Troponin level elevated   . Pulmonary edema 03/09/2015  . Fluid overload 03/09/2015  . Chronic pain 03/09/2015  . Hyperkalemia 03/09/2015  . Hypoglycemia 03/09/2015  . Diabetes mellitus ([place]) 03/09/2015  . Chest pain at rest   . Accelerated hypertension 02/24/2015  . DM (diabetes mellitus) type II controlled with renal manifestation ([place]) 02/24/2015  . Acute pulmonary edema ([place]) 01/21/2015  . Acute dyspnea 01/18/2015  . Volume overload 01/18/2015  . HLD (hyperlipidemia) 11/27/2014  . Hypoxia 09/30/2014  . Dizziness 09/30/2014  . Hypotension 09/30/2014  . ESRD on dialysis ([place]) 09/30/2014  . Coronary artery disease involving native coronary artery of native heart with unstable angina pectoris ([place]) 03/20/2014  . Allergic  reaction to dye 03/20/2014  . NSTEMI (non-ST elevated myocardial infarction) ([place]) 03/19/2014  . HTN (hypertension) 02/18/2014  . Influenza with other respiratory manifestations 06/17/2013  . Acute respiratory failure with hypoxia ([place]) 06/16/2013  . Tracheobronchitis 06/16/2013  . Salmonella bacteremia 07/10/2012  . Salmonella gastroenteritis 07/10/2012  . Acute respiratory failure ([place]) 07/04/2012  . Septic shock ([place]) 07/04/2012  . Metabolic acidosis 34/19/3790  . Fever and chills 07/03/2012  . Diarrhea 07/03/2012  . Nausea 07/03/2012  . Leukocytosis 07/03/2012  . AKI (acute kidney injury) ([place]) 07/03/2012  . Pain in the chest 06/18/2012  . Swelling of arm 03/19/2012  . Colitis, ischemic ([place]) 08/31/2011  . ESRD (end stage renal disease) ([place]) 08/27/2011  . Abdominal pain 08/27/2011  . Pericardial effusion 08/27/2011  . Constipation, acute 08/23/2011  . History of renal transplant 07/22/2011  . S/P BKA (below knee amputation) ([place]) 07/22/2011  . Obstructive sleep apnea 07/22/2011  . Dyspnea 07/22/2011  . Acute on chronic diastolic heart failure ([place]) 07/22/2011  . Anemia 07/22/2011  . Diabetes mellitus with nephropathy ([place]) 07/22/2011    Past Surgical History:  Procedure Laterality Date  . AMPUTATION OF REPLICATED TOES Right   . ANGIOPLASTY  04/09/2014   RCA   . APPENDECTOMY  ~ 2006  . ARTERIOVENOUS GRAFT PLACEMENT Left 1993?   forearm  . ARTERIOVENOUS GRAFT PLACEMENT Right 1993?   leg  . ARTERIOVENOUS GRAFT PLACEMENT Left    Thigh  . Arteriovenous Graft Removed Left    Thigh  . AV FISTULA PLACEMENT  07/29/2011   Procedure: ARTERIOVENOUS (AV) FISTULA CREATION;  Surgeon: Mal Misty, MD;  Location: [place];  Service: Vascular;  Laterality: Right;  Brachial cephalic  . AV FISTULA PLACEMENT Left 01/20/2015   Procedure: LIGATION OF RIGHT ARTERIOVENOUS FISTULA WITH EXCISION OF ANEURYSM;  Surgeon: Angelia Mould, MD;  Location: [place];  Service: Vascular;   Laterality: Left;  . AV FISTULA PLACEMENT Left 04/30/2015   Procedure: CREATION OF LEFT ARM BRACHIO-CEPHALIC ARTERIOVENOUS (AV) FISTULA ;  Surgeon: Angelia Mould, MD;  Location: [facility];  Service: Vascular;  Laterality: Left;  . AV Graft PLACEMENT Left 1993?   "attempted one in my wrist; didn't take"  . BELOW KNEE LEG AMPUTATION Left 2010  . CARDIAC CATHETERIZATION  03/19/2014  . CARDIAC CATHETERIZATION  03/19/2014   Procedure: CORONARY BALLOON ANGIOPLASTY;  Surgeon: Burnell Blanks, MD;  Location: [facility] CATH LAB;  Service: Cardiovascular;;  . CARDIAC CATHETERIZATION N/A 01/01/2016   Procedure: Left Heart Cath and Coronary Angiography;  Surgeon: Troy Sine, MD;  Location: [place] CV LAB;  Service: Cardiovascular;  Laterality: N/A;  .  CARDIAC CATHETERIZATION N/A 01/01/2016   Procedure: Coronary Stent Intervention;  Surgeon: Troy Sine, MD;  Location: [place] CV LAB;  Service: Cardiovascular;  Laterality: N/A;  . CARPAL TUNNEL RELEASE Bilateral after 2005  . CATARACT EXTRACTION W/ INTRAOCULAR LENS  IMPLANT, BILATERAL Bilateral 1990's  . COLONOSCOPY  08/29/2011   Procedure: COLONOSCOPY;  Surgeon: Jerene Bears, MD;  Location: [place];  Service: Gastroenterology;  Laterality: N/A;  . CORONARY ANGIOPLASTY WITH STENT PLACEMENT  04/09/2014       ptca/des mid lad   . ESOPHAGOGASTRODUODENOSCOPY N/A 12/25/2015   Procedure: ESOPHAGOGASTRODUODENOSCOPY (EGD);  Surgeon: Mauri Pole, MD;  Location: [facility] ENDOSCOPY;  Service: Endoscopy;  Laterality: N/A;  bedside  . EYE SURGERY Bilateral    cataract  . FEMORAL ARTERY REPAIR  04/09/2014   ANGIOSEAL  . INSERTION OF DIALYSIS CATHETER  08/01/2011   Procedure: INSERTION OF DIALYSIS CATHETER;  Surgeon: Rosetta Posner, MD;  Location: [place] [facility];  Service: Vascular;  Laterality: Right;  insertion of dialysis catheter on right internal jugular vein  . INSERTION OF DIALYSIS CATHETER Right 01/20/2015   Procedure: INSERTION OF DIALYSIS CATHETER - RIGHT  INTERNAL JUGULAR ;  Surgeon: Angelia Mould, MD;  Location: [facility];  Service: Vascular;  Laterality: Right;  . KIDNEY TRANSPLANT  04/25/1993  . LEFT HEART CATHETERIZATION WITH CORONARY ANGIOGRAM N/A 03/19/2014   Procedure: LEFT HEART CATHETERIZATION WITH CORONARY ANGIOGRAM;  Surgeon: Burnell Blanks, MD;  Location: [facility] CATH LAB;  Service: Cardiovascular;  Laterality: N/A;  . LYMPH NODE BIOPSY  08/10/2011   Procedure: LYMPH NODE BIOPSY;  Surgeon: Merrie Roof, MD;  Location: [facility];  Service: General;  Laterality: Left;  left groin lymph biopsy  . NEUROPLASTY / TRANSPOSITION ULNAR NERVE AT ELBOW Left after 2005  . PERCUTANEOUS CORONARY ROTOBLATOR INTERVENTION (PCI-R) N/A 04/09/2014   Procedure: PERCUTANEOUS CORONARY ROTOBLATOR INTERVENTION (PCI-R);  Surgeon: Burnell Blanks, MD;  Location: [facility] CATH LAB;  Service: Cardiovascular;  Laterality: N/A;  . PERIPHERAL VASCULAR CATHETERIZATION N/A 12/25/2014   Procedure: Upper Extremity Venography;  Surgeon: Conrad Hardwick, MD;  Location: [place] CV LAB;  Service: Cardiovascular;  Laterality: N/A;  . PERIPHERAL VASCULAR CATHETERIZATION Left 02/24/2016   Procedure: Fistulagram;  Surgeon: Waynetta Sandy, MD;  Location: [place] CV LAB;  Service: Cardiovascular;  Laterality: Left;  . PERIPHERAL VASCULAR CATHETERIZATION Left 02/24/2016   Procedure: Peripheral Vascular Intervention;  Surgeon: Waynetta Sandy, MD;  Location: [place] CV LAB;  Service: Cardiovascular;  Laterality: Left;  AV FISTULA  . PERITONEAL CATHETER INSERTION    . PERITONEAL CATHETER REMOVAL    . REVISON OF ARTERIOVENOUS FISTULA Right 2/63/7858   Procedure: PLICATION / REVISION OF ARTERIOVENOUS FISTULA;  Surgeon: Angelia Mould, MD;  Location: [facility];  Service: Vascular;  Laterality: Right;  . REVISON OF ARTERIOVENOUS FISTULA Left 09/01/2015   Procedure: SUPERFICIALIZATION OF LEFT ARM BRACHIOCEPHALIC ARTERIOVENOUS FISTULA;  Surgeon: Angelia Mould, MD;  Location: [place];  Service: Vascular;  Laterality: Left;  . SHOULDER ARTHROSCOPY Left ~ 2006   frozen  . TOE AMPUTATION Bilateral after 2005  . UMBILICAL HERNIA REPAIR  1990's  . VENOGRAM Right 07/28/2011   Procedure: VENOGRAM;  Surgeon: Conrad Stoughton, MD;  Location: [facility] CATH LAB;  Service: Cardiovascular;  Laterality: Right;  . VITRECTOMY Bilateral        Home Medications    Prior to Admission medications   Medication Sig Start Date End Date Taking? Authorizing Provider  albuterol (PROVENTIL  HFA;VENTOLIN HFA) 108 (90 Base) MCG/ACT inhaler Inhale 2 puffs into the lungs every 6 (six) hours as needed for wheezing or shortness of breath. 01/26/16  Yes Brand Males, MD  allopurinol (ZYLOPRIM) 100 MG tablet Take 200 mg by mouth daily.  03/14/12  Yes Historical Provider, MD  aspirin EC 81 MG tablet Take 81 mg by mouth at bedtime.   Yes Historical Provider, MD  atorvastatin (LIPITOR) 80 MG tablet Take 1 tablet (80 mg total) by mouth daily at 6 PM. 01/04/16  Yes Florencia Reasons, MD  calcium acetate (PHOSLO) 667 MG capsule Take 667 mg by mouth See admin instructions. Take 1 capsule  by mouth with meals and 1 with snacks 09/19/12  Yes Historical Provider, MD  diphenhydrAMINE (BENADRYL) 25 mg capsule Take 1-2 capsules (25-50 mg total) by mouth every 6 (six) hours as needed for itching. Patient taking differently: Take 50 mg by mouth 2 (two) times daily as needed for itching or sleep.  04/11/14  Yes Rhonda G Barrett, PA-C  docusate sodium (COLACE) 100 MG capsule Take 100 mg by mouth 3 (three) times daily.   Yes Historical Provider, MD  fenofibrate (TRICOR) 48 MG tablet Take 1 tablet (48 mg total) by mouth daily. 02/27/15  Yes Shanker Kristeen Mans, MD  Ferric Citrate (AURYXIA) 1 GM 210 MG(Fe) TABS Take 2 g by mouth 3 (three) times daily with meals.   Yes Historical Provider, MD  fluticasone (FLONASE) 50 MCG/ACT nasal spray Place 1 spray into both nostrils daily as needed for allergies.    Yes Historical  Provider, MD  gabapentin (NEURONTIN) 300 MG capsule Take 300 mg by mouth 3 (three) times daily.   Yes Historical Provider, MD  GLUCAGON EMERGENCY 1 MG injection Inject 1 mg into the muscle daily as needed (low blood sugar).  01/19/12  Yes Historical Provider, MD  HYDROmorphone (DILAUDID) 2 MG tablet Take 1 tablet (2 mg total) by mouth 3 (three) times daily as needed for severe pain. Patient taking differently: Take 2 mg by mouth 2 (two) times daily. Take 1 tablet (2 mg) by mouth daily at bedtime, may take an additional dose during the day as needed for severe pain 11/20/14  Yes Alvia Grove, PA-C  Insulin Human (INSULIN PUMP) 100 unit/ml SOLN Inject 1 each into the skin continuous. Humalog   Yes Historical Provider, MD  insulin lispro (HUMALOG) 100 UNIT/ML injection Inject 1-175 Units into the skin continuous. Up to 175 units continuous in pump,  Basal rates area as follows:  2400 1.5 units/hr, 0300 1.9 units/hr, 0800 1.45 units/hr, 1200 1.625 units/hr   Yes Historical Provider, MD  levothyroxine (SYNTHROID, LEVOTHROID) 100 MCG tablet Take 100 mcg by mouth at bedtime.    Yes Historical Provider, MD  loratadine (CLARITIN) 10 MG tablet Take 10 mg by mouth daily.   Yes Historical Provider, MD  metoCLOPramide (REGLAN) 5 MG tablet Take 2 tablets (10 mg total) by mouth 4 (four) times daily - after meals and at bedtime. 01/21/16  Yes Velvet Bathe, MD  metoprolol tartrate (LOPRESSOR) 25 MG tablet Take 1 tablet (25 mg total) by mouth 2 (two) times daily. 01/28/16  Yes Eileen Stanford, PA-C  midodrine (PROAMATINE) 10 MG tablet Take 20 mg by mouth every Monday, Wednesday, and Friday. On dialysis days   Yes Historical Provider, MD  multivitamin (RENA-VIT) TABS tablet Take 1 tablet by mouth daily.   Yes Historical Provider, MD  nitroGLYCERIN (NITROSTAT) 0.4 MG SL tablet Place 1 tablet (0.4 mg total) under  the tongue every 5 (five) minutes as needed for chest pain. 02/15/16  Yes Burnell Blanks, MD    Nutritional Supplements (FEEDING SUPPLEMENT, NEPRO CARB STEADY,) LIQD Take 237 mLs by mouth daily. 01/04/16  Yes Florencia Reasons, MD  OXYGEN Inhale 3 L into the lungs at bedtime.   Yes Historical Provider, MD  pantoprazole (PROTONIX) 40 MG tablet Take 1 tablet (40 mg total) by mouth 2 (two) times daily. 02/06/16  Yes Belkys A Regalado, MD  Polyethyl Glycol-Propyl Glycol (SYSTANE) 0.4-0.3 % SOLN Place 1 drop into both eyes as needed (dry eyes).   Yes Historical Provider, MD  polyethylene glycol (MIRALAX / GLYCOLAX) packet Take 17 g by mouth daily. Patient taking differently: Take 17 g by mouth daily as needed for mild constipation.  02/06/16  Yes Belkys A Regalado, MD  prasugrel (EFFIENT) 10 MG TABS tablet Take 1 tablet (10 mg total) by mouth daily. 01/28/16 04/27/16 Yes Eileen Stanford, PA-C  promethazine (PHENERGAN) 25 MG tablet Take 1 tablet (25 mg total) by mouth every 6 (six) hours as needed for nausea or vomiting. For nausea 11/27/14  Yes Liliane Shi, PA-C  rOPINIRole (REQUIP) 2 MG tablet Take 2 mg by mouth daily with supper.    Yes Historical Provider, MD  vancomycin (VANCOCIN) 1-5 GM/200ML-% SOLN Inject 200 mLs (1,000 mg total) into the vein every Monday, Wednesday, and Friday with hemodialysis. 02/08/16  Yes Elmarie Shiley, MD    Family History Family History  Problem Relation Age of Onset  . Colon polyps Father   . Diabetes Father   . Hypertension Father   . Hypertension Mother   . Diabetes Mother   . Hyperlipidemia Mother   . Breast cancer Maternal Aunt   . Cancer Maternal Aunt     Breast and Bone  . Malignant hyperthermia Neg Hx   . Heart attack Neg Hx   . Stroke Neg Hx     Social History Social History  Substance Use Topics  . Smoking status: Never Smoker  . Smokeless tobacco: Never Used  . Alcohol use Yes     Allergies   Contrast media [iodinated diagnostic agents]; Rocephin [ceftriaxone]; Amoxicillin; Brilinta [ticagrelor]; Buprenorphine hcl; Clopidogrel;  Hydrocodone; Morphine; Adhesive [tape]; Prednisone; Ciprofloxacin; Clindamycin/lincomycin; Codeine; Doxycycline; Enalapril; Levofloxacin; Mobic [meloxicam]; Oxycodone; and Vasotec   Review of Systems Review of Systems  Constitutional: Positive for chills, diaphoresis and fever.  Neurological: Positive for dizziness.  All other systems reviewed and are negative.    Physical Exam Updated Vital Signs BP 126/57   Pulse 91   Temp 98.4 F (36.9 C) (Oral)   Resp (!) 30   Ht 5\' 9"  (1.753 m)   Wt 250 lb (113.4 kg)   SpO2 100%   BMI 36.92 kg/m   Physical Exam  Constitutional: He is oriented to person, place, and time.  Ill appearing   HENT:  Head: Normocephalic.  Eyes: EOM are normal. Pupils are equal, round, and reactive to light.  Neck: Normal range of motion. Neck supple.  Cardiovascular: Normal rate, regular rhythm and normal heart sounds.   Pulmonary/Chest: Effort normal.  Diminished bilateral bases   Abdominal: Soft. Bowel sounds are normal. He exhibits no distension. There is no tenderness.  Musculoskeletal:  L BKA. Some bruising R shin but no obvious cellulitis   Neurological: He is alert and oriented to person, place, and time.  Skin: Skin is warm.  Psychiatric: He has a normal mood and affect.  Nursing note and vitals reviewed.  ED Treatments / Results  Labs (all labs ordered are listed, but only abnormal results are displayed) Labs Reviewed  COMPREHENSIVE METABOLIC PANEL - Abnormal; Notable for the following:       Result Value   Sodium 132 (*)    Potassium 5.6 (*)    Chloride 94 (*)    Glucose, Bld 204 (*)    BUN 54 (*)    Creatinine, Ser 9.05 (*)    Albumin 3.0 (*)    AST 92 (*)    GFR calc non Af Amer 6 (*)    GFR calc Af Amer 7 (*)    Anion gap 16 (*)    All other components within normal limits  CBC WITH DIFFERENTIAL/PLATELET - Abnormal; Notable for the following:    WBC 21.6 (*)    RBC 3.47 (*)    Hemoglobin 10.3 (*)    HCT 34.4 (*)    MCHC  29.9 (*)    RDW 19.6 (*)    Neutro Abs 20.0 (*)    All other components within normal limits  I-STAT CG4 LACTIC ACID, ED - Abnormal; Notable for the following:    Lactic Acid, Venous 3.47 (*)    All other components within normal limits  CULTURE, BLOOD (ROUTINE X 2)  CULTURE, BLOOD (ROUTINE X 2)  I-STAT TROPOININ, ED    EKG  EKG Interpretation  Date/Time:  Thursday February 25 2016 07:46:01 EDT Ventricular Rate:  99 PR Interval:    QRS Duration: 84 QT Interval:  370 QTC Calculation: 475 R Axis:   72 Text Interpretation:  Sinus rhythm No significant change since last tracing Confirmed by Aaliyana Fredericks  MD, Christophr Calix ([id number]) on 02/25/2016 8:10:42 AM       Radiology Dg Chest Port 1 View  Result Date: 02/25/2016 CLINICAL DATA:  50 year old male with chest pain. Subsequent encounter. EXAM: PORTABLE CHEST 1 VIEW COMPARISON:  02/06/2016 chest x-ray.  01/28/2016 chest CT. FINDINGS: Cardiomegaly. Pulmonary vascular prominence most notable centrally. No segmental consolidation or pneumothorax. Left subclavian stent in place. Limited evaluation of mediastinal structures. IMPRESSION: Cardiomegaly. Pulmonary vascular prominence most notable centrally. Electronically Signed   By: Genia Del M.D.   On: 02/25/2016 07:57    Procedures Procedures (including critical care time)   CRITICAL CARE Performed by: Wandra Arthurs   Total critical care time: 30 minutes  Critical care time was exclusive of separately billable procedures and treating other patients.  Critical care was necessary to treat or prevent imminent or life-threatening deterioration.  Critical care was time spent personally by me on the following activities: development of treatment plan with patient and/or surrogate as well as nursing, discussions with consultants, evaluation of patient's response to treatment, examination of patient, obtaining history from patient or surrogate, ordering and performing treatments and interventions,  ordering and review of laboratory studies, ordering and review of radiographic studies, pulse oximetry and re-evaluation of patient's condition.   Medications Ordered in ED Medications  sodium chloride 0.9 % bolus 1,000 mL (0 mLs Intravenous Stopped 02/25/16 0925)    And  sodium chloride 0.9 % bolus 1,000 mL (0 mLs Intravenous Stopped 02/25/16 0925)    And  sodium chloride 0.9 % bolus 1,000 mL (1,000 mLs Intravenous New Bag/Given 02/25/16 0942)    And  sodium chloride 0.9 % bolus 500 mL (500 mLs Intravenous New Bag/Given 02/25/16 0942)  vancomycin (VANCOCIN) 2,000 mg in sodium chloride 0.9 % 500 mL IVPB (2,000 mg Intravenous New Bag/Given 02/25/16 0834)  methylPREDNISolone sodium succinate (SOLU-MEDROL)  125 mg/2 mL injection 125 mg (125 mg Intravenous Given 02/25/16 0747)  diphenhydrAMINE (BENADRYL) injection 50 mg (50 mg Intravenous Given 02/25/16 0748)  acetaminophen (TYLENOL) tablet 1,000 mg (1,000 mg Oral Given 02/25/16 0808)  piperacillin-tazobactam (ZOSYN) IVPB 3.375 g (0 g Intravenous Stopped 02/25/16 0917)     Initial Impression / Assessment and Plan / ED Course  I have reviewed the triage vital signs and the nursing notes.  Pertinent labs & imaging results that were available during my care of the patient were reviewed by me and considered in my medical decision making (see chart for details).  Clinical Course   Jhaden Pizzuto is a 50 y.o. male here with hypotension fever. Febrile 101.2 F, hypotensive to the 70s. Had angiogram yesterday and was given heparin. I don't know if he was exposed to sick patients while in the hospital. Code sepsis initiated. Will give 30 cc/kg bolus. Consider dissection or leak from the graft so will get CT dissection study. He has IV contrast dye allergy. Called radiology and he still needs premedication today, even though he received it yesterday. Will get labs, lactate, cultures, CXR. Will get CT angio and give empiric abx.   9:44 AM BP up to 126/57 after  3 L NS bolus. Felt better. Lungs still clear. Didn't require more oxygen. No abdominal pain. Recently admitted for CHF, hypotension on the hospitalist service as well. Lactate elevated 3.5. WBC 21. Given vanc/zosyn empirically, does have pneumonia before. Cultures sent. Canceled CT since he felt better and hypotension resolved. Will admit to stepdown.   Sepsis - Repeat Assessment  Performed at:    9:43 am  Vitals     Blood pressure 126/57, pulse 91, temperature 98.4 F (36.9 C), temperature source Oral, resp. rate (!) 30, height 5\' 9"  (1.753 m), weight 250 lb (113.4 kg), SpO2 100 %.  Heart:     Regular rate and rhythm  Lungs:    CTA  Capillary Refill:   <2 sec  Peripheral Pulse:   Radial pulse palpable  Skin:     Normal Color      Final Clinical Impressions(s) / ED Diagnoses   Final diagnoses:  None    New Prescriptions New Prescriptions   No medications on file     Drenda Freeze, MD 02/25/16 [phone]

## 2016-02-25 NOTE — ED Notes (Signed)
Pt called out, advising that he feels bad and this usually happens when his blood sugar is high. Checked CBG and it was 316. RN alerted.

## 2016-02-25 NOTE — ED Notes (Signed)
Report given to 6E RN 

## 2016-02-25 NOTE — ED Notes (Signed)
Attempted report x 2 

## 2016-02-25 NOTE — ED Notes (Signed)
Dr. Darl Householder made aware of lactic acid.

## 2016-02-25 NOTE — ED Notes (Signed)
Called back into pt's RR to assist with clean up. Pt had dark stool upon wiping but advised that it was from his iron supplement. Pt cleaned and assisted back to bed. 5 Lead reattached and phleb bedside to draw blood. Will hook back up to NBP and SpO2 when blood draw complete.

## 2016-02-25 NOTE — ED Notes (Signed)
Lab called with lactic acid result of 2.5. Dyanne Carrel, NP texted paged about lab result.

## 2016-02-25 NOTE — ED Triage Notes (Signed)
Pt c/o dizziness. Per pt family pt had fistulagram yesterday. Pt woke up sweaty and dizzy. Pt a&ox4. Upon presentation pt is hypotensive.

## 2016-02-25 NOTE — ED Notes (Addendum)
Pt currently in Rr, asked to call out when finished

## 2016-02-25 NOTE — ED Notes (Signed)
Attempted report x1. 

## 2016-02-25 NOTE — H&P (Signed)
History and Physical    Dale [name] DVV:[id number] DOB: 01/04/1966 DOA: 02/25/2016  PCP: [name], MD Patient coming from: home  Chief Complaint: fever dizziness  HPI: Dale [name] is a 50 y.o. male with medical history significant for CAD, CHF, end-stage renal disease on dialysis, hypertension, recent pneumonia as well as bacteremia presents to emergency Department chief complaint generalized weakness fever and dizziness. Initial evaluation reveals sepsis unknown source with an elevated lactic acid hypotension tachycardia leukocytosis and fever.  Information is obtained from the patient and the wife who is at the bedside. Patient admitted to the hospital 2 weeks ago with acute on chronic congestive heart failure secondary to fluid overload. He states he went home shortly thereafter and is doing "okay". He states yesterday he underwent angiogram for left arm graft that was thought to be clotted. He had a balloon angioplasty and a stent. States when he went to bed last night he was "doing fine". During the night he developed chills and diaphoresis. He checked his blood pressure in the systolic was in the 62I. He also thought that his left arm out warm he took his temperature and reported it was 99. He denies chest pain palpitation headache syncope or near-syncope. He denies any cough lower any edema abdominal pain nausea vomiting. He denies diarrhea constipation melena. He has not make urine and is a Monday Wednesday Friday dialysis patient    ED Course: Emergency department he is hypotensive with a systolic blood pressure in the 60s tachycardic with a heart rate 101 temperature 101.2. Code sepsis was initiated he was fluid resuscitated with 30 mL/kg. Antibiotics initiated as well.  At the time of admission he is afebrile with a stable blood pressure  Review of Systems: As per HPI otherwise 10 point review of systems negative.   Ambulatory Status: Ambulates recent fall  Past Medical  History:  Diagnosis Date  . Anemia of chronic disease   . Arthritis    "hands, right knee" (03/19/2014)  . CAD (coronary artery disease)    a. Abnl nuc ->LHC (11/15):  mid to dist Dx 80%, mid RCA 99% (functional CTO), dist RCA 80% >> PCI: balloon angioplasty to mid RCA (could not deliver stent) - staged PCI Rotoblator atherectomy/DES to [facility]. b. NSTEMI 12/2015 s/p DES to diagonal.  . Cholecystitis    a. 08/27/2011  . Chronic diastolic CHF (congestive heart failure) ([place])    a. Echo 3/13:  EF 55-60%;  b. Echo (11/15):  Mild LVH, EF 60-65%, no RWMA, Gr 1 DD, MAC, mild LAE, normal RVF, mild RAE;  c.  Echo 5/16:  severe LVH, EF 55-60%, no RWMA, Gr 1 DD, MAC, mild LAE  . Claustrophobia    when things get around his face.   . Difficult intubation    only once in 2015 at [facility] haven't had a problem since then  . ESRD (end stage renal disease) ([place])    a. 1995 s/p cadaveric transplant w/ susbequent failure after 18 yrs;  b.Dialysis initiated 07/2011 [facility] M-W-F  . Fibromyalgia   . Gastroparesis   . GERD (gastroesophageal reflux disease)   . Gout    PMH  . History of blood transfusion   . HLD (hyperlipidemia)   . Hypertension   . Hypothyroidism   . Inguinal lymphadenopathy    a. bilateral - s/p biopsy 07/2011  . Insulin dependent diabetes mellitus (HCC)    Type I  . Morbid obesity ([place])   . OSA (obstructive  sleep apnea)    adjustable bed  . PAD (peripheral artery disease) (HCC)    a. s/p L BKA.  [name] Kitchen Pericardial effusion    a.  Small by CT 08/22/11;  b.  Large by CT 08/27/11. c. Not seen on 11/2015 echo.  . Pneumonia ~ 2007  . Tracheobronchomalacia     Past Surgical History:  Procedure Laterality Date  . AMPUTATION OF REPLICATED TOES Right   . ANGIOPLASTY  04/09/2014   RCA   . APPENDECTOMY  ~ 2006  . ARTERIOVENOUS GRAFT PLACEMENT Left 1993?   forearm  . ARTERIOVENOUS GRAFT PLACEMENT Right 1993?   leg  . ARTERIOVENOUS GRAFT PLACEMENT Left    Thigh  . Arteriovenous Graft  Removed Left    Thigh  . AV FISTULA PLACEMENT  07/29/2011   Procedure: ARTERIOVENOUS (AV) FISTULA CREATION;  Surgeon: Mal Misty, MD;  Location: [place];  Service: Vascular;  Laterality: Right;  Brachial cephalic  . AV FISTULA PLACEMENT Left 01/20/2015   Procedure: LIGATION OF RIGHT ARTERIOVENOUS FISTULA WITH EXCISION OF ANEURYSM;  Surgeon: Angelia Mould, MD;  Location: [place];  Service: Vascular;  Laterality: Left;  . AV FISTULA PLACEMENT Left 04/30/2015   Procedure: CREATION OF LEFT ARM BRACHIO-CEPHALIC ARTERIOVENOUS (AV) FISTULA ;  Surgeon: Angelia Mould, MD;  Location: [place];  Service: Vascular;  Laterality: Left;  . AV Graft PLACEMENT Left 1993?   "attempted one in my wrist; didn't take"  . BELOW KNEE LEG AMPUTATION Left 2010  . CARDIAC CATHETERIZATION  03/19/2014  . CARDIAC CATHETERIZATION  03/19/2014   Procedure: CORONARY BALLOON ANGIOPLASTY;  Surgeon: Burnell Blanks, MD;  Location: [facility] CATH LAB;  Service: Cardiovascular;;  . CARDIAC CATHETERIZATION N/A 01/01/2016   Procedure: Left Heart Cath and Coronary Angiography;  Surgeon: Troy Sine, MD;  Location: [place] CV LAB;  Service: Cardiovascular;  Laterality: N/A;  . CARDIAC CATHETERIZATION N/A 01/01/2016   Procedure: Coronary Stent Intervention;  Surgeon: Troy Sine, MD;  Location: [place] CV LAB;  Service: Cardiovascular;  Laterality: N/A;  . CARPAL TUNNEL RELEASE Bilateral after 2005  . CATARACT EXTRACTION W/ INTRAOCULAR LENS  IMPLANT, BILATERAL Bilateral 1990's  . COLONOSCOPY  08/29/2011   Procedure: COLONOSCOPY;  Surgeon: Jerene Bears, MD;  Location: [place];  Service: Gastroenterology;  Laterality: N/A;  . CORONARY ANGIOPLASTY WITH STENT PLACEMENT  04/09/2014       ptca/des mid lad   . ESOPHAGOGASTRODUODENOSCOPY N/A 12/25/2015   Procedure: ESOPHAGOGASTRODUODENOSCOPY (EGD);  Surgeon: Mauri Pole, MD;  Location: [facility] ENDOSCOPY;  Service: Endoscopy;  Laterality: N/A;  bedside  . EYE SURGERY  Bilateral    cataract  . FEMORAL ARTERY REPAIR  04/09/2014   ANGIOSEAL  . INSERTION OF DIALYSIS CATHETER  08/01/2011   Procedure: INSERTION OF DIALYSIS CATHETER;  Surgeon: Rosetta Posner, MD;  Location: [place];  Service: Vascular;  Laterality: Right;  insertion of dialysis catheter on right internal jugular vein  . INSERTION OF DIALYSIS CATHETER Right 01/20/2015   Procedure: INSERTION OF DIALYSIS CATHETER - RIGHT INTERNAL JUGULAR ;  Surgeon: Angelia Mould, MD;  Location: [place];  Service: Vascular;  Laterality: Right;  . KIDNEY TRANSPLANT  04/25/1993  . LEFT HEART CATHETERIZATION WITH CORONARY ANGIOGRAM N/A 03/19/2014   Procedure: LEFT HEART CATHETERIZATION WITH CORONARY ANGIOGRAM;  Surgeon: Burnell Blanks, MD;  Location: [facility] CATH LAB;  Service: Cardiovascular;  Laterality: N/A;  . LYMPH NODE BIOPSY  08/10/2011   Procedure: LYMPH NODE BIOPSY;  Surgeon: Sammuel Hines  Daiva Nakayama, MD;  Location: [place];  Service: General;  Laterality: Left;  left groin lymph biopsy  . NEUROPLASTY / TRANSPOSITION ULNAR NERVE AT ELBOW Left after 2005  . PERCUTANEOUS CORONARY ROTOBLATOR INTERVENTION (PCI-R) N/A 04/09/2014   Procedure: PERCUTANEOUS CORONARY ROTOBLATOR INTERVENTION (PCI-R);  Surgeon: Burnell Blanks, MD;  Location: [facility] CATH LAB;  Service: Cardiovascular;  Laterality: N/A;  . PERIPHERAL VASCULAR CATHETERIZATION N/A 12/25/2014   Procedure: Upper Extremity Venography;  Surgeon: Conrad Avon, MD;  Location: [place] CV LAB;  Service: Cardiovascular;  Laterality: N/A;  . PERIPHERAL VASCULAR CATHETERIZATION Left 02/24/2016   Procedure: Fistulagram;  Surgeon: Waynetta Sandy, MD;  Location: [place] CV LAB;  Service: Cardiovascular;  Laterality: Left;  . PERIPHERAL VASCULAR CATHETERIZATION Left 02/24/2016   Procedure: Peripheral Vascular Intervention;  Surgeon: Waynetta Sandy, MD;  Location: [place] CV LAB;  Service: Cardiovascular;  Laterality: Left;  AV FISTULA  . PERITONEAL  CATHETER INSERTION    . PERITONEAL CATHETER REMOVAL    . REVISON OF ARTERIOVENOUS FISTULA Right 9/44/9675   Procedure: PLICATION / REVISION OF ARTERIOVENOUS FISTULA;  Surgeon: Angelia Mould, MD;  Location: [place];  Service: Vascular;  Laterality: Right;  . REVISON OF ARTERIOVENOUS FISTULA Left 09/01/2015   Procedure: SUPERFICIALIZATION OF LEFT ARM BRACHIOCEPHALIC ARTERIOVENOUS FISTULA;  Surgeon: Angelia Mould, MD;  Location: [place];  Service: Vascular;  Laterality: Left;  . SHOULDER ARTHROSCOPY Left ~ 2006   frozen  . TOE AMPUTATION Bilateral after 2005  . UMBILICAL HERNIA REPAIR  1990's  . VENOGRAM Right 07/28/2011   Procedure: VENOGRAM;  Surgeon: Conrad , MD;  Location: [facility] CATH LAB;  Service: Cardiovascular;  Laterality: Right;  . VITRECTOMY Bilateral     Social History   Social History  . Marital status: Married    Spouse name: N/A  . Number of children: N/A  . Years of education: N/A   Occupational History  . Disabled    Social History Main Topics  . Smoking status: Never Smoker  . Smokeless tobacco: Never Used  . Alcohol use Yes  . Drug use: No  . Sexual activity: Not on file   Other Topics Concern  . Not on file   Social History Narrative   Lives @ home with his wife in [place].     Allergies  Allergen Reactions  . Contrast Media [Iodinated Diagnostic Agents] Rash and Itching    Systemic itching and transient rash appearance within hours of receiving contrast  Systemic itching and transient rash appearance within hours of receiving contrast  Redman Syndrome  . Rocephin [Ceftriaxone] Itching  . Amoxicillin Itching and Rash    .Has patient had a PCN reaction causing immediate rash, facial/tongue/throat swelling, SOB or lightheadedness with hypotension: Yes Has patient had a PCN reaction causing severe rash involving mucus membranes or skin necrosis: No Has patient had a PCN reaction that required hospitalization No Has patient had a PCN  reaction occurring within the last 10 years: No If all of the above answers are "NO", then may proceed with Cephalosporin use.  Kary Kos [Ticagrelor] Nausea And Vomiting  . Buprenorphine Hcl Nausea Only and Hives  . Clopidogrel Rash  . Hydrocodone Rash  . Morphine Nausea Only, Rash and Hives  . Adhesive [Tape] Other (See Comments) and Itching    Blisters on arm from adhesive tape at dialysis Blisters on arm from adhesive tape at dialysis  . Prednisone Other (See Comments)    Increases sugar levels  .  Ciprofloxacin Itching and Rash  . Clindamycin/Lincomycin Hives and Itching  . Codeine Hives, Itching and Rash  . Doxycycline Rash  . Enalapril Rash    cough  . Levofloxacin Hives and Itching  . Mobic [Meloxicam] Hives and Rash  . Oxycodone Rash  . Vasotec Cough    Family History  Problem Relation Age of Onset  . Colon polyps Father   . Diabetes Father   . Hypertension Father   . Hypertension Mother   . Diabetes Mother   . Hyperlipidemia Mother   . Breast cancer Maternal Aunt   . Cancer Maternal Aunt     Breast and Bone  . Malignant hyperthermia Neg Hx   . Heart attack Neg Hx   . Stroke Neg Hx     Prior to Admission medications   Medication Sig Start Date End Date Taking? Authorizing Provider  albuterol (PROVENTIL HFA;VENTOLIN HFA) 108 (90 Base) MCG/ACT inhaler Inhale 2 puffs into the lungs every 6 (six) hours as needed for wheezing or shortness of breath. 01/26/16  Yes Brand Males, MD  allopurinol (ZYLOPRIM) 100 MG tablet Take 200 mg by mouth daily.  03/14/12  Yes Historical Provider, MD  aspirin EC 81 MG tablet Take 81 mg by mouth at bedtime.   Yes Historical Provider, MD  atorvastatin (LIPITOR) 80 MG tablet Take 1 tablet (80 mg total) by mouth daily at 6 PM. 01/04/16  Yes Florencia Reasons, MD  calcium acetate (PHOSLO) 667 MG capsule Take 667 mg by mouth See admin instructions. Take 1 capsule  by mouth with meals and 1 with snacks 09/19/12  Yes Historical Provider, MD    diphenhydrAMINE (BENADRYL) 25 mg capsule Take 1-2 capsules (25-50 mg total) by mouth every 6 (six) hours as needed for itching. Patient taking differently: Take 50 mg by mouth 2 (two) times daily as needed for itching or sleep.  04/11/14  Yes Rhonda G Barrett, PA-C  docusate sodium (COLACE) 100 MG capsule Take 100 mg by mouth 3 (three) times daily.   Yes Historical Provider, MD  fenofibrate (TRICOR) 48 MG tablet Take 1 tablet (48 mg total) by mouth daily. 02/27/15  Yes Shanker Kristeen Mans, MD  Ferric Citrate (AURYXIA) 1 GM 210 MG(Fe) TABS Take 2 g by mouth 3 (three) times daily with meals.   Yes Historical Provider, MD  fluticasone (FLONASE) 50 MCG/ACT nasal spray Place 1 spray into both nostrils daily as needed for allergies.    Yes Historical Provider, MD  gabapentin (NEURONTIN) 300 MG capsule Take 300 mg by mouth 3 (three) times daily.   Yes Historical Provider, MD  GLUCAGON EMERGENCY 1 MG injection Inject 1 mg into the muscle daily as needed (low blood sugar).  01/19/12  Yes Historical Provider, MD  HYDROmorphone (DILAUDID) 2 MG tablet Take 1 tablet (2 mg total) by mouth 3 (three) times daily as needed for severe pain. Patient taking differently: Take 2 mg by mouth 2 (two) times daily. Take 1 tablet (2 mg) by mouth daily at bedtime, may take an additional dose during the day as needed for severe pain 11/20/14  Yes Alvia Grove, PA-C  Insulin Human (INSULIN PUMP) 100 unit/ml SOLN Inject 1 each into the skin continuous. Humalog   Yes Historical Provider, MD  insulin lispro (HUMALOG) 100 UNIT/ML injection Inject 1-175 Units into the skin continuous. Up to 175 units continuous in pump,  Basal rates area as follows:  2400 1.5 units/hr, 0300 1.9 units/hr, 0800 1.45 units/hr, 1200 1.625 units/hr   Yes Historical  Provider, MD  levothyroxine (SYNTHROID, LEVOTHROID) 100 MCG tablet Take 100 mcg by mouth at bedtime.    Yes Historical Provider, MD  loratadine (CLARITIN) 10 MG tablet Take 10 mg by mouth daily.    Yes Historical Provider, MD  metoCLOPramide (REGLAN) 5 MG tablet Take 2 tablets (10 mg total) by mouth 4 (four) times daily - after meals and at bedtime. 01/21/16  Yes Velvet Bathe, MD  metoprolol tartrate (LOPRESSOR) 25 MG tablet Take 1 tablet (25 mg total) by mouth 2 (two) times daily. 01/28/16  Yes Eileen Stanford, PA-C  midodrine (PROAMATINE) 10 MG tablet Take 20 mg by mouth every Monday, Wednesday, and Friday. On dialysis days   Yes Historical Provider, MD  multivitamin (RENA-VIT) TABS tablet Take 1 tablet by mouth daily.   Yes Historical Provider, MD  nitroGLYCERIN (NITROSTAT) 0.4 MG SL tablet Place 1 tablet (0.4 mg total) under the tongue every 5 (five) minutes as needed for chest pain. 02/15/16  Yes Burnell Blanks, MD  Nutritional Supplements (FEEDING SUPPLEMENT, NEPRO CARB STEADY,) LIQD Take 237 mLs by mouth daily. 01/04/16  Yes Florencia Reasons, MD  OXYGEN Inhale 3 L into the lungs at bedtime.   Yes Historical Provider, MD  pantoprazole (PROTONIX) 40 MG tablet Take 1 tablet (40 mg total) by mouth 2 (two) times daily. 02/06/16  Yes Belkys A Regalado, MD  Polyethyl Glycol-Propyl Glycol (SYSTANE) 0.4-0.3 % SOLN Place 1 drop into both eyes as needed (dry eyes).   Yes Historical Provider, MD  polyethylene glycol (MIRALAX / GLYCOLAX) packet Take 17 g by mouth daily. Patient taking differently: Take 17 g by mouth daily as needed for mild constipation.  02/06/16  Yes Belkys A Regalado, MD  prasugrel (EFFIENT) 10 MG TABS tablet Take 1 tablet (10 mg total) by mouth daily. 01/28/16 04/27/16 Yes Eileen Stanford, PA-C  promethazine (PHENERGAN) 25 MG tablet Take 1 tablet (25 mg total) by mouth every 6 (six) hours as needed for nausea or vomiting. For nausea 11/27/14  Yes Liliane Shi, PA-C  rOPINIRole (REQUIP) 2 MG tablet Take 2 mg by mouth daily with supper.    Yes Historical Provider, MD  vancomycin (VANCOCIN) 1-5 GM/200ML-% SOLN Inject 200 mLs (1,000 mg total) into the vein every Monday, Wednesday, and  Friday with hemodialysis. 02/08/16  Yes Elmarie Shiley, MD    Physical Exam: Vitals:   02/25/16 0911 02/25/16 0930 02/25/16 0943 02/25/16 1000  BP: 111/60 126/57  129/69  Pulse: 92 91  92  Resp: 19 (!) 30  25  Temp:   98.4 F (36.9 C)   TempSrc:   Oral   SpO2: 100% 100%  100%  Weight:      Height:         General:  Appears Slightly lethargic but not uncomfortable  Face slightly flushed Eyes:  PERRL, EOMI, normal lids, iris ENT:  grossly normal hearing, lips & tongue, mucous membranes of his mouth are pink and moist Neck:  no LAD, masses or thyromegaly Cardiovascular:  RRR, no m/r/g. No LE edema. Left below the knee amputation Respiratory:  CTA bilaterally with slightly diminished no w/r/r. Mild increased work of breathing with conversation Abdomen:  soft, ntnd, obese positive bowel sounds no guarding or rebounding Skin:  no rash or induration seen on limited exam, left upper arm fistula site mild erythema no swelling some tenderness Musculoskeletal:  grossly normal tone BUE/BLE, good ROM, no bony abnormality Psychiatric:  grossly normal mood and affect, speech fluent and appropriate, AOx3  Neurologic:  CN 2-12 grossly intact, moves all extremities in coordinated fashion, sensation intact  Labs on Admission: I have personally reviewed following labs and imaging studies  CBC:  Recent Labs Lab 02/24/16 1330 02/25/16 0735  WBC  --  21.6*  NEUTROABS  --  20.0*  HGB 12.2* 10.3*  HCT 36.0* 34.4*  MCV  --  99.1  PLT  --  188   Basic Metabolic Panel:  Recent Labs Lab 02/24/16 1330 02/25/16 0735  NA 131* 132*  K 6.2* 5.6*  CL 98* 94*  CO2  --  22  GLUCOSE 336* 204*  BUN 45* 54*  CREATININE 7.30* 9.05*  CALCIUM  --  9.3   GFR: Estimated Creatinine Clearance: 12.1 mL/min (by C-G formula based on SCr of 9.05 mg/dL (H)). Liver Function Tests:  Recent Labs Lab 02/25/16 0735  AST 92*  ALT 40  ALKPHOS 82  BILITOT 0.9  PROT 7.6  ALBUMIN 3.0*   No results for  input(s): LIPASE, AMYLASE in the last 168 hours. No results for input(s): AMMONIA in the last 168 hours. Coagulation Profile:  Recent Labs Lab 02/25/16 1019  INR 1.26   Cardiac Enzymes: No results for input(s): CKTOTAL, CKMB, CKMBINDEX, TROPONINI in the last 168 hours. BNP (last 3 results) No results for input(s): PROBNP in the last 8760 hours. HbA1C: No results for input(s): HGBA1C in the last 72 hours. CBG:  Recent Labs Lab 02/24/16 1350 02/24/16 1532 02/24/16 1859  GLUCAP 331* 341* 346*   Lipid Profile: No results for input(s): CHOL, HDL, LDLCALC, TRIG, CHOLHDL, LDLDIRECT in the last 72 hours. Thyroid Function Tests: No results for input(s): TSH, T4TOTAL, FREET4, T3FREE, THYROIDAB in the last 72 hours. Anemia Panel: No results for input(s): VITAMINB12, FOLATE, FERRITIN, TIBC, IRON, RETICCTPCT in the last 72 hours. Urine analysis:    Component Value Date/Time   COLORURINE YELLOW 06/16/2013 2059   APPEARANCEUR CLEAR 06/16/2013 2059   LABSPEC 1.018 06/16/2013 2059   PHURINE 5.5 06/16/2013 2059   GLUCOSEU NEGATIVE 06/16/2013 2059   HGBUR SMALL (A) 06/16/2013 2059   BILIRUBINUR NEGATIVE 06/16/2013 2059   [place] NEGATIVE 06/16/2013 2059   PROTEINUR 100 (A) 06/16/2013 2059   UROBILINOGEN 0.2 06/16/2013 2059   NITRITE NEGATIVE 06/16/2013 2059   LEUKOCYTESUR NEGATIVE 06/16/2013 2059    Creatinine Clearance: Estimated Creatinine Clearance: 12.1 mL/min (by C-G formula based on SCr of 9.05 mg/dL (H)).  Sepsis Labs: @LABRCNTIP (procalcitonin:4,lacticidven:4) )No results found for this or any previous visit (from the past 240 hour(s)).   Radiological Exams on Admission: Dg Chest Port 1 View  Result Date: 02/25/2016 CLINICAL DATA:  50 year old male with chest pain. Subsequent encounter. EXAM: PORTABLE CHEST 1 VIEW COMPARISON:  02/06/2016 chest x-ray.  01/28/2016 chest CT. FINDINGS: Cardiomegaly. Pulmonary vascular prominence most notable centrally. No segmental  consolidation or pneumothorax. Left subclavian stent in place. Limited evaluation of mediastinal structures. IMPRESSION: Cardiomegaly. Pulmonary vascular prominence most notable centrally. Electronically Signed   By: Genia Del M.D.   On: 02/25/2016 07:57    EKG: Independently reviewed. Sinus rhythm No significant change since last tracing   Assessment/Plan Principal Problem:   Sepsis ([place]) Active Problems:   ESRD (end stage renal disease) (HCC)   Fever and chills   HTN (hypertension)   Coronary artery disease involving native coronary artery of native heart with unstable angina pectoris (HCC)   HLD (hyperlipidemia)   Hyperkalemia   Diabetes mellitus (HCC)   FUO (fever of unknown origin)   Anemia of chronic disease  Severe sepsis (HCC)   Chronic diastolic heart failure ([place])   #1. Sepsis. Source unclear. Chest x-ray without infiltrate. Elevated lactic acid hypotension tachycardia fever 101.2, leukocytosis. Code sepsis called in the emergency department. Underwent fluid resuscitation blood pressure stable on admission afebrile and not hypoxic. With a history of bacteremia, pneumonia. In addition underwent an fistulogram yesterday. -Admit to step down -Continue vancomycin and Zosyn-follow -Follow blood cultures -Stop IV fluids after resuscitation complete -Nephrology consult to assist with fluid management  #2. End-stage renal disease Monday Wednesday Friday dialysis patient. Recently underwent fistulogram. Potassium 5.2. Creatinine 9.05 which is well above his baseline -Nephrology consult  #3. Hyperkalemia. Potassium level 5.6. Related to above -Nephrology consult  4. Diabetes. Patient is on an insulin pump. Serum glucose 336 yesterday. 204 today. Of note patient had steroids as prep for imaging yesterday and today. Gap is 16 -Patient removed his own pump before coming to the hospital -Obtain a hemoglobin A1c -Sliding scale for optimal control -Abuse coordinator for  recommendations  #5. Anemia of chronic disease. Hemoglobin 10.3. Chart review indicates this is close to his baseline. Having said that yesterday hemoglobin 12.2. He underwent a fistulogram and was given heparin. No obvious signs of bleeding -Monitor closely  #6. CAD. Denies chest pain. EKG without acute changes. Recent non-STEMI with positive troponin. Heart cath with DES 01/01/16 -Continue home meds  #7. Hypertension. See #1. Home medications include Toprol midodrine -Hold beta blocker for now -continue midodrine  #8. Diastolic heart failure. Echo recently revealed mild LVH with an EF of 55% and grade 2 diastolic dysfunction. Medications include data blocker. He is a Monday Wednesday Friday dialysis patient. Today's weight 113.4 kg. -Monitor intake and output -Continue dialysis per nephrology -Daily weights  #9. Chronic pain. Home medications include oral Dilaudid, Neurontin. He is requesting IV Dilaudid on admission. Explain the challenges/concerns given his BP etc - continue gabapentin -Stop oral Dilaudid for now -Very low-dose IV Dilaudid -Monitor pain closely and discontinue IV Dilaudid when able     DVT prophylaxis: lovenox Code Status: full  Family Communication: wife at bedside  Disposition Plan: home  Consults called: nephrology   Admission status: inpatient   Radene Gunning MD [facility]  If 7PM-7AM, please contact night-coverage [email or web address] Password [email or web address]  02/25/2016, 10:56 AM

## 2016-02-25 NOTE — Progress Notes (Signed)
Pt. CBG greater than 600 when checked. On call NP, K. Schorr, for [facility] paged to make aware. RN awaiting orders.

## 2016-02-25 NOTE — Consult Note (Addendum)
Renal Service Consult Note [facility]  Amere Bricco 02/25/2016 Roney Jaffe D Requesting Physician:  Dr Marily Memos  Reason for Consult:  ESRD pt with chills and hypotension HPI: The patient is a 50 y.o. year-old with hx of IDDM, L BKA, HTN, ESRD on HD presented to ED with chills, AMS and hypotension.  He had an access procedure done yest in OP setting by [facility] after his am HD.  [facility] did stenting to cephalic venous stenosis, and dilated arterial inflow stenosis.  Patient was fine last night per his wife, then this am was clammy, confused and BP's at home were in the "60's".  Taken to ED where rec'd 2-3 L of IVF's, blood cx's drawn and IV abx given.  BP now 120 ./ 70 and pt feels a lot better.  Denies any active or recent CP/ prod cough/ diarrhea/ dysuria.  Asked to see for HD   2016 admits>> Chest pain, hx CAD w PCI 12/15 to RCA, normal echo, no intervention Dizziness / Hypotension, BP meds held, NS bolus, no PE by CTA, imdur dc'd at dc SOB / pulm edema , rx with serial HD CP due to HTN urgency, rx NTG gtt, trops neg, echo normal LV, low risk Lexi SOB/ pulm edema, rx with serial HD 2017 admits>> Aug - acute GIB/ hypotension / NSTEMI demand ischemia/ VDRF, stridor post extubation, IDDM, esrd Sept - N/V, likely gastroparesis exac by opioids, improved w Reglan; fever, no source, resolvd ESRD HD Oct - abd wall cellulitis rx Vanc, CP ar rest, w/u neg, vol excess 125 > 116kg at dc, esrd on HD    ROS  denies CP  no joint pain   no HA  no blurry vision  no rash  no diarrhea  no nausea/ vomiting  no dysuria  no difficulty voiding  no change in urine color    Past Medical History  Past Medical History:  Diagnosis Date  . Anemia of chronic disease   . Arthritis    "hands, right knee" (03/19/2014)  . CAD (coronary artery disease)    a. Abnl nuc ->LHC (11/15):  mid to dist Dx 80%, mid RCA 99% (functional CTO), dist RCA 80% >> PCI: balloon angioplasty to mid RCA (could  not deliver stent) - staged PCI Rotoblator atherectomy/DES to [facility]. b. NSTEMI 12/2015 s/p DES to diagonal.  . Cholecystitis    a. 08/27/2011  . Chronic diastolic CHF (congestive heart failure) ([place])    a. Echo 3/13:  EF 55-60%;  b. Echo (11/15):  Mild LVH, EF 60-65%, no RWMA, Gr 1 DD, MAC, mild LAE, normal RVF, mild RAE;  c.  Echo 5/16:  severe LVH, EF 55-60%, no RWMA, Gr 1 DD, MAC, mild LAE  . Claustrophobia    when things get around his face.   . Difficult intubation    only once in 2015 at [facility] haven't had a problem since then  . ESRD (end stage renal disease) ([place])    a. 1995 s/p cadaveric transplant w/ susbequent failure after 18 yrs;  b.Dialysis initiated 07/2011 [facility] M-W[facility]F  . Fibromyalgia   . Gastroparesis   . GERD (gastroesophageal reflux disease)   . Gout    PMH  . History of blood transfusion   . HLD (hyperlipidemia)   . Hypertension   . Hypothyroidism   . Inguinal lymphadenopathy    a. bilateral - s/p biopsy 07/2011  . Insulin dependent diabetes mellitus (HCC)    Type I  . Morbid  obesity ([place])   . OSA (obstructive sleep apnea)    adjustable bed  . PAD (peripheral artery disease) (HCC)    a. s/p L BKA.  [name] Kitchen Pericardial effusion    a.  Small by CT 08/22/11;  b.  Large by CT 08/27/11. c. Not seen on 11/2015 echo.  . Pneumonia ~ 2007  . Tracheobronchomalacia    Past Surgical History  Past Surgical History:  Procedure Laterality Date  . AMPUTATION OF REPLICATED TOES Right   . ANGIOPLASTY  04/09/2014   RCA   . APPENDECTOMY  ~ 2006  . ARTERIOVENOUS GRAFT PLACEMENT Left 1993?   forearm  . ARTERIOVENOUS GRAFT PLACEMENT Right 1993?   leg  . ARTERIOVENOUS GRAFT PLACEMENT Left    Thigh  . Arteriovenous Graft Removed Left    Thigh  . AV FISTULA PLACEMENT  07/29/2011   Procedure: ARTERIOVENOUS (AV) FISTULA CREATION;  Surgeon: Mal Misty, MD;  Location: [place];  Service: Vascular;  Laterality: Right;  Brachial cephalic  . AV FISTULA PLACEMENT Left 01/20/2015    Procedure: LIGATION OF RIGHT ARTERIOVENOUS FISTULA WITH EXCISION OF ANEURYSM;  Surgeon: Angelia Mould, MD;  Location: [place];  Service: Vascular;  Laterality: Left;  . AV FISTULA PLACEMENT Left 04/30/2015   Procedure: CREATION OF LEFT ARM BRACHIO-CEPHALIC ARTERIOVENOUS (AV) FISTULA ;  Surgeon: Angelia Mould, MD;  Location: [place];  Service: Vascular;  Laterality: Left;  . AV Graft PLACEMENT Left 1993?   "attempted one in my wrist; didn't take"  . BELOW KNEE LEG AMPUTATION Left 2010  . CARDIAC CATHETERIZATION  03/19/2014  . CARDIAC CATHETERIZATION  03/19/2014   Procedure: CORONARY BALLOON ANGIOPLASTY;  Surgeon: Burnell Blanks, MD;  Location[facility] LAB;  Service: Cardiovascular;;  . CARDIAC CATHETERIZATION N/A 01/01/2016   Procedure: Left Heart Cath and Coronary Angiography;  Surgeon: Troy Sine, MD;  Location: [place] CV LAB;  Service: Cardiovascular;  Laterality: N/A;  . CARDIAC CATHETERIZATION N/A 01/01/2016   Procedure: Coronary Stent Intervention;  Surgeon: Troy Sine, MD;  Location: [place] CV LAB;  Service: Cardiovascular;  Laterality: N/A;  . CARPAL TUNNEL RELEASE Bilateral after 2005  . CATARACT EXTRACTION W/ INTRAOCULAR LENS  IMPLANT, BILATERAL Bilateral 1990's  . COLONOSCOPY  08/29/2011   Procedure: COLONOSCOPY;  Surgeon: Jerene Bears, MD;  Location: [place];  Service: Gastroenterology;  Laterality: N/A;  . CORONARY ANGIOPLASTY WITH STENT PLACEMENT  04/09/2014       ptca/des mid lad   . ESOPHAGOGASTRODUODENOSCOPY N/A 12/25/2015   Procedure: ESOPHAGOGASTRODUODENOSCOPY (EGD);  Surgeon: Mauri Pole, MD;  Location: [facility] ENDOSCOPY;  Service: Endoscopy;  Laterality: N/A;  bedside  . EYE SURGERY Bilateral    cataract  . FEMORAL ARTERY REPAIR  04/09/2014   ANGIOSEAL  . INSERTION OF DIALYSIS CATHETER  08/01/2011   Procedure: INSERTION OF DIALYSIS CATHETER;  Surgeon: Rosetta Posner, MD;  Location: [place];  Service: Vascular;  Laterality: Right;  insertion  of dialysis catheter on right internal jugular vein  . INSERTION OF DIALYSIS CATHETER Right 01/20/2015   Procedure: INSERTION OF DIALYSIS CATHETER - RIGHT INTERNAL JUGULAR ;  Surgeon: Angelia Mould, MD;  Location: [place];  Service: Vascular;  Laterality: Right;  . KIDNEY TRANSPLANT  04/25/1993  . LEFT HEART CATHETERIZATION WITH CORONARY ANGIOGRAM N/A 03/19/2014   Procedure: LEFT HEART CATHETERIZATION WITH CORONARY ANGIOGRAM;  Surgeon: Burnell Blanks, MD;  Location: [facility] CATH LAB;  Service: Cardiovascular;  Laterality: N/A;  . LYMPH NODE BIOPSY  08/10/2011  Procedure: LYMPH NODE BIOPSY;  Surgeon: Merrie Roof, MD;  Location: [place];  Service: General;  Laterality: Left;  left groin lymph biopsy  . NEUROPLASTY / TRANSPOSITION ULNAR NERVE AT ELBOW Left after 2005  . PERCUTANEOUS CORONARY ROTOBLATOR INTERVENTION (PCI-R) N/A 04/09/2014   Procedure: PERCUTANEOUS CORONARY ROTOBLATOR INTERVENTION (PCI-R);  Surgeon: Burnell Blanks, MD;  Location: [facility] CATH LAB;  Service: Cardiovascular;  Laterality: N/A;  . PERIPHERAL VASCULAR CATHETERIZATION N/A 12/25/2014   Procedure: Upper Extremity Venography;  Surgeon: Conrad Lamont, MD;  Location: [place] CV LAB;  Service: Cardiovascular;  Laterality: N/A;  . PERIPHERAL VASCULAR CATHETERIZATION Left 02/24/2016   Procedure: Fistulagram;  Surgeon: Waynetta Sandy, MD;  Location: [place] CV LAB;  Service: Cardiovascular;  Laterality: Left;  . PERIPHERAL VASCULAR CATHETERIZATION Left 02/24/2016   Procedure: Peripheral Vascular Intervention;  Surgeon: Waynetta Sandy, MD;  Location: [place] CV LAB;  Service: Cardiovascular;  Laterality: Left;  AV FISTULA  . PERITONEAL CATHETER INSERTION    . PERITONEAL CATHETER REMOVAL    . REVISON OF ARTERIOVENOUS FISTULA Right 7/34/1937   Procedure: PLICATION / REVISION OF ARTERIOVENOUS FISTULA;  Surgeon: Angelia Mould, MD;  Location: [place];  Service: Vascular;  Laterality: Right;  .  REVISON OF ARTERIOVENOUS FISTULA Left 09/01/2015   Procedure: SUPERFICIALIZATION OF LEFT ARM BRACHIOCEPHALIC ARTERIOVENOUS FISTULA;  Surgeon: Angelia Mould, MD;  Location: [place];  Service: Vascular;  Laterality: Left;  . SHOULDER ARTHROSCOPY Left ~ 2006   frozen  . TOE AMPUTATION Bilateral after 2005  . UMBILICAL HERNIA REPAIR  1990's  . VENOGRAM Right 07/28/2011   Procedure: VENOGRAM;  Surgeon: Conrad Virden, MD;  Location: [facility] CATH LAB;  Service: Cardiovascular;  Laterality: Right;  . VITRECTOMY Bilateral    Family History  Family History  Problem Relation Age of Onset  . Colon polyps Father   . Diabetes Father   . Hypertension Father   . Hypertension Mother   . Diabetes Mother   . Hyperlipidemia Mother   . Breast cancer Maternal Aunt   . Cancer Maternal Aunt     Breast and Bone  . Malignant hyperthermia Neg Hx   . Heart attack Neg Hx   . Stroke Neg Hx    Social History  reports that he has never smoked. He has never used smokeless tobacco. He reports that he drinks alcohol. He reports that he does not use drugs. Allergies  Allergies  Allergen Reactions  . Contrast Media [Iodinated Diagnostic Agents] Rash and Itching    Systemic itching and transient rash appearance within hours of receiving contrast  Systemic itching and transient rash appearance within hours of receiving contrast  Redman Syndrome  . Rocephin [Ceftriaxone] Itching  . Amoxicillin Itching and Rash    .Has patient had a PCN reaction causing immediate rash, facial/tongue/throat swelling, SOB or lightheadedness with hypotension: Yes Has patient had a PCN reaction causing severe rash involving mucus membranes or skin necrosis: No Has patient had a PCN reaction that required hospitalization No Has patient had a PCN reaction occurring within the last 10 years: No If all of the above answers are "NO", then may proceed with Cephalosporin use.  Kary Kos [Ticagrelor] Nausea And Vomiting  . Buprenorphine Hcl  Nausea Only and Hives  . Clopidogrel Rash  . Hydrocodone Rash  . Morphine Nausea Only, Rash and Hives  . Adhesive [Tape] Other (See Comments) and Itching    Blisters on arm from adhesive tape at dialysis Blisters on  arm from adhesive tape at dialysis  . Prednisone Other (See Comments)    Increases sugar levels  . Ciprofloxacin Itching and Rash  . Clindamycin/Lincomycin Hives and Itching  . Codeine Hives, Itching and Rash  . Doxycycline Rash  . Enalapril Rash    cough  . Levofloxacin Hives and Itching  . Mobic [Meloxicam] Hives and Rash  . Oxycodone Rash  . Vasotec Cough   Home medications Prior to Admission medications   Medication Sig Start Date End Date Taking? Authorizing Provider  albuterol (PROVENTIL HFA;VENTOLIN HFA) 108 (90 Base) MCG/ACT inhaler Inhale 2 puffs into the lungs every 6 (six) hours as needed for wheezing or shortness of breath. 01/26/16  Yes Brand Males, MD  allopurinol (ZYLOPRIM) 100 MG tablet Take 200 mg by mouth daily.  03/14/12  Yes Historical Provider, MD  aspirin EC 81 MG tablet Take 81 mg by mouth at bedtime.   Yes Historical Provider, MD  atorvastatin (LIPITOR) 80 MG tablet Take 1 tablet (80 mg total) by mouth daily at 6 PM. 01/04/16  Yes Florencia Reasons, MD  calcium acetate (PHOSLO) 667 MG capsule Take 667 mg by mouth See admin instructions. Take 1 capsule  by mouth with meals and 1 with snacks 09/19/12  Yes Historical Provider, MD  diphenhydrAMINE (BENADRYL) 25 mg capsule Take 1-2 capsules (25-50 mg total) by mouth every 6 (six) hours as needed for itching. Patient taking differently: Take 50 mg by mouth 2 (two) times daily as needed for itching or sleep.  04/11/14  Yes Rhonda G Barrett, PA-C  docusate sodium (COLACE) 100 MG capsule Take 100 mg by mouth 3 (three) times daily.   Yes Historical Provider, MD  fenofibrate (TRICOR) 48 MG tablet Take 1 tablet (48 mg total) by mouth daily. 02/27/15  Yes Shanker Kristeen Mans, MD  Ferric Citrate (AURYXIA) 1 GM 210 MG(Fe)  TABS Take 2 g by mouth 3 (three) times daily with meals.   Yes Historical Provider, MD  fluticasone (FLONASE) 50 MCG/ACT nasal spray Place 1 spray into both nostrils daily as needed for allergies.    Yes Historical Provider, MD  gabapentin (NEURONTIN) 300 MG capsule Take 300 mg by mouth 3 (three) times daily.   Yes Historical Provider, MD  GLUCAGON EMERGENCY 1 MG injection Inject 1 mg into the muscle daily as needed (low blood sugar).  01/19/12  Yes Historical Provider, MD  HYDROmorphone (DILAUDID) 2 MG tablet Take 1 tablet (2 mg total) by mouth 3 (three) times daily as needed for severe pain. Patient taking differently: Take 2 mg by mouth 2 (two) times daily. Take 1 tablet (2 mg) by mouth daily at bedtime, may take an additional dose during the day as needed for severe pain 11/20/14  Yes Alvia Grove, PA-C  Insulin Human (INSULIN PUMP) 100 unit/ml SOLN Inject 1 each into the skin continuous. Humalog   Yes Historical Provider, MD  insulin lispro (HUMALOG) 100 UNIT/ML injection Inject 1-175 Units into the skin continuous. Up to 175 units continuous in pump,  Basal rates area as follows:  2400 1.5 units/hr, 0300 1.9 units/hr, 0800 1.45 units/hr, 1200 1.625 units/hr   Yes Historical Provider, MD  levothyroxine (SYNTHROID, LEVOTHROID) 100 MCG tablet Take 100 mcg by mouth at bedtime.    Yes Historical Provider, MD  loratadine (CLARITIN) 10 MG tablet Take 10 mg by mouth daily.   Yes Historical Provider, MD  metoCLOPramide (REGLAN) 5 MG tablet Take 2 tablets (10 mg total) by mouth 4 (four) times daily - after  meals and at bedtime. 01/21/16  Yes Velvet Bathe, MD  metoprolol tartrate (LOPRESSOR) 25 MG tablet Take 1 tablet (25 mg total) by mouth 2 (two) times daily. 01/28/16  Yes Eileen Stanford, PA-C  midodrine (PROAMATINE) 10 MG tablet Take 20 mg by mouth every Monday, Wednesday, and Friday. On dialysis days   Yes Historical Provider, MD  multivitamin (RENA-VIT) TABS tablet Take 1 tablet by mouth daily.    Yes Historical Provider, MD  nitroGLYCERIN (NITROSTAT) 0.4 MG SL tablet Place 1 tablet (0.4 mg total) under the tongue every 5 (five) minutes as needed for chest pain. 02/15/16  Yes Burnell Blanks, MD  Nutritional Supplements (FEEDING SUPPLEMENT, NEPRO CARB STEADY,) LIQD Take 237 mLs by mouth daily. 01/04/16  Yes Florencia Reasons, MD  OXYGEN Inhale 3 L into the lungs at bedtime.   Yes Historical Provider, MD  pantoprazole (PROTONIX) 40 MG tablet Take 1 tablet (40 mg total) by mouth 2 (two) times daily. 02/06/16  Yes Belkys A Regalado, MD  Polyethyl Glycol-Propyl Glycol (SYSTANE) 0.4-0.3 % SOLN Place 1 drop into both eyes as needed (dry eyes).   Yes Historical Provider, MD  polyethylene glycol (MIRALAX / GLYCOLAX) packet Take 17 g by mouth daily. Patient taking differently: Take 17 g by mouth daily as needed for mild constipation.  02/06/16  Yes Belkys A Regalado, MD  prasugrel (EFFIENT) 10 MG TABS tablet Take 1 tablet (10 mg total) by mouth daily. 01/28/16 04/27/16 Yes Eileen Stanford, PA-C  promethazine (PHENERGAN) 25 MG tablet Take 1 tablet (25 mg total) by mouth every 6 (six) hours as needed for nausea or vomiting. For nausea 11/27/14  Yes Liliane Shi, PA-C  rOPINIRole (REQUIP) 2 MG tablet Take 2 mg by mouth daily with supper.    Yes Historical Provider, MD  vancomycin (VANCOCIN) 1-5 GM/200ML-% SOLN Inject 200 mLs (1,000 mg total) into the vein every Monday, Wednesday, and Friday with hemodialysis. 02/08/16  Yes Belkys A Regalado, MD   Liver Function Tests  Recent Labs Lab 02/25/16 0735  AST 92*  ALT 40  ALKPHOS 82  BILITOT 0.9  PROT 7.6  ALBUMIN 3.0*   No results for input(s): LIPASE, AMYLASE in the last 168 hours. CBC  Recent Labs Lab 02/24/16 1330 02/25/16 0735  WBC  --  21.6*  NEUTROABS  --  20.0*  HGB 12.2* 10.3*  HCT 36.0* 34.4*  MCV  --  99.1  PLT  --  735   Basic Metabolic Panel  Recent Labs Lab 02/24/16 1330 02/25/16 0735  NA 131* 132*  K 6.2* 5.6*  CL 98*  94*  CO2  --  22  GLUCOSE 336* 204*  BUN 45* 54*  CREATININE 7.30* 9.05*  CALCIUM  --  9.3   Iron/TIBC/Ferritin/ %Sat    Component Value Date/Time   IRON 26 (L) 02/03/2016 1454   TIBC 132 (L) 02/03/2016 1454   FERRITIN 1,022 (H) 02/03/2016 1454   IRONPCTSAT 20 02/03/2016 1454    Vitals:   02/25/16 1030 02/25/16 1100 02/25/16 1130 02/25/16 1200  BP: 119/59 (!) 113/54 132/60 120/60  Pulse: 90 94 100 93  Resp: 22 26  22   Temp:      TempSrc:      SpO2: 100% 100% 100% 100%  Weight:      Height:       Exam Gen sleepy, obese, WM, no distress, arouses easily and mostly oriented No rash, cyanosis or gangrene Sclera anicteric, throat clear  No jvd or bruits Chest  clear bilat to bases RRR no MRG Abd soft ntnd no mass or ascites +bs markedly obes GU normal male MS no joint effusions or deformity, L BKA Ext 1+ LE edema of R leg, no stump edema Neuro is alert, Ox 3 , nf LUA AVF +bruit, no drainage / erythema/ fluctuance     Dialysis: MWF [facility] 4h 88min  115kg (making it to 116-117)  2/2.25 bath  P4  Hep 5000  LUA AVF (worked on 11/1 by [facility]) Mircera 225 last 11/1, due 11/15 Hect 3 ug  9.5/ 3.8/ 152 Auryxia/ phoslo binders  Assessment: 1. Hypotension/ chills - r/o sepsis, blood cx's sent, IV abx given.  BP better after fluids. 2. ESRD on HD MWF, had HD yest 3. IDDM 4. Vol - get wt when sent to room, lungs clear, mild LE edema 5. HTN - takes midodrine and MTP at home 6. CAD hx RCA stent 2015 7. Chron diast HF    Plan - HD tomorrow, AB, f/u cx's  Kelly Splinter MD [facility] pager [phone]   02/25/2016, 12:43 PM

## 2016-02-25 NOTE — Progress Notes (Signed)
Pharmacy Antibiotic Note  Dale [name] is a 50 y.o. male admitted on 02/25/2016 with chills, diaphoresis and low BP at home. He was in the hospital yesterday for fistulogram of L arm graft. Will start empiric antibiotics for sepsis. LA 3.47, WBC 21.6, afebrile. He is ESRD on HD-MWF.   Plan: -Vancomycin 2 g IV x1 then 1 g IV qHD -Zosyn 3.375 g IV q12h -Monitor HD schedule, cultures, VR as needed  Height: 5\' 9"  (175.3 cm) Weight: 250 lb (113.4 kg) IBW/kg (Calculated) : 70.7  Temp (24hrs), Avg:99 F (37.2 C), Min:97.8 F (36.6 C), Max:101.2 F (38.4 C)    Recent Labs Lab 02/24/16 1330 02/25/16 0735  WBC  --  21.6*  CREATININE 7.30*  --     Estimated Creatinine Clearance: 15 mL/min (by C-G formula based on SCr of 7.3 mg/dL (H)).     Antimicrobials this admission: 11/2 zosyn > 11/2 vancomycin >  Dose adjustments this admission: NA  Microbiology results: 11/2 blood cx:   Thank you for allowing pharmacy to be a part of this patient's care.  Harvel Quale 02/25/2016 8:05 AM

## 2016-02-25 NOTE — ED Notes (Signed)
Attempted report to [place] x2.

## 2016-02-25 NOTE — ED Notes (Signed)
Ordered pt meal tray 

## 2016-02-25 NOTE — ED Notes (Signed)
Spoke to pt placement. Bed assignment switch from 5W to [place].

## 2016-02-25 NOTE — Progress Notes (Signed)
Inpatient Diabetes Program Recommendations  AACE/ADA: New Consensus Statement on Inpatient Glycemic Control (2015)  Target Ranges:  Prepandial:   less than 140 mg/dL      Peak postprandial:   less than 180 mg/dL (1-2 hours)      Critically ill patients:  140 - 180 mg/dL   Lab Results  Component Value Date   GLUCAP 208 (H) 02/25/2016   HGBA1C 5.8 (H) 02/02/2016    Review of Glycemic Control:    Results for FARREL, GUIMOND (MRN [id number]) as of 02/25/2016 13:18  Ref. Range 02/24/2016 13:50 02/24/2016 15:32 02/24/2016 18:59 02/25/2016 11:48 02/25/2016 12:44  Glucose-Capillary Latest Ref Range: 65 - 99 mg/dL 331 (H) 341 (H) 346 (H) 163 (H) 208 (H)   Diabetes history: Type 1 diabetes Outpatient Diabetes medications:  Insulin pump: 24-1.5 units/hr                        3a-1.9 units/hr                        8a-1.45 units/hr                       12n-1.625 units/hr  Current orders for Inpatient glycemic control:  Novolog sensitive tid with meals and HS Lantus 10 units daily  Inpatient Diabetes Program Recommendations:    Referral received for diabetes management. Talked with patient in ED.  He states that he removed insulin pump prior to coming to the hospital.  He states that when insulin pump is removed, he usually takes 15-20 units of Lantus.  Patient states that he feels like blood sugars are elevated however only 208 mg/dL at 12:44p.  Text paged NP with recommendations to increase Lantus to 15 units and give first dose now.  Also recommend Novolog 4 units tid with meals (hold if patient eats less than 50%).  Thanks, Adah Perl, RN, BC-ADM Inpatient Diabetes Coordinator Pager [phone] (8a-5p)

## 2016-02-25 NOTE — Progress Notes (Signed)
Pt. With critical BMET results. Glucose 618, Critical K+ 6.9. On call for TRH, K. Schorr, NP, made aware. Awaiting orders. RN will continue to monitor pt. For changes in condition. Antinette Keough, Katherine Roan

## 2016-02-26 DIAGNOSIS — Z794 Long term (current) use of insulin: Secondary | ICD-10-CM

## 2016-02-26 DIAGNOSIS — A419 Sepsis, unspecified organism: Secondary | ICD-10-CM

## 2016-02-26 DIAGNOSIS — D638 Anemia in other chronic diseases classified elsewhere: Secondary | ICD-10-CM

## 2016-02-26 DIAGNOSIS — I25110 Atherosclerotic heart disease of native coronary artery with unstable angina pectoris: Secondary | ICD-10-CM

## 2016-02-26 DIAGNOSIS — E11649 Type 2 diabetes mellitus with hypoglycemia without coma: Secondary | ICD-10-CM

## 2016-02-26 LAB — CBC
HCT: 25.2 % — ABNORMAL LOW (ref 39.0–52.0)
HCT: 25.6 % — ABNORMAL LOW (ref 39.0–52.0)
Hemoglobin: 7.6 g/dL — ABNORMAL LOW (ref 13.0–17.0)
Hemoglobin: 7.8 g/dL — ABNORMAL LOW (ref 13.0–17.0)
MCH: 29.5 pg (ref 26.0–34.0)
MCH: 29.3 pg (ref 26.0–34.0)
MCHC: 30.2 g/dL (ref 30.0–36.0)
MCHC: 30.5 g/dL (ref 30.0–36.0)
MCV: 97.7 fL (ref 78.0–100.0)
MCV: 96.2 fL (ref 78.0–100.0)
Platelets: 331 10*3/uL (ref 150–400)
Platelets: 306 10*3/uL (ref 150–400)
RBC: 2.58 MIL/uL — ABNORMAL LOW (ref 4.22–5.81)
RBC: 2.66 MIL/uL — ABNORMAL LOW (ref 4.22–5.81)
RDW: 19 % — ABNORMAL HIGH (ref 11.5–15.5)
RDW: 18.9 % — ABNORMAL HIGH (ref 11.5–15.5)
WBC: 15.7 10*3/uL — ABNORMAL HIGH (ref 4.0–10.5)
WBC: 11.8 10*3/uL — ABNORMAL HIGH (ref 4.0–10.5)

## 2016-02-26 LAB — COMPREHENSIVE METABOLIC PANEL
ALT: 35 U/L (ref 17–63)
AST: 66 U/L — ABNORMAL HIGH (ref 15–41)
Albumin: 2.7 g/dL — ABNORMAL LOW (ref 3.5–5.0)
Alkaline Phosphatase: 88 U/L (ref 38–126)
Anion gap: 13 (ref 5–15)
BUN: 78 mg/dL — ABNORMAL HIGH (ref 6–20)
CO2: 19 mmol/L — ABNORMAL LOW (ref 22–32)
Calcium: 8.9 mg/dL (ref 8.9–10.3)
Chloride: 96 mmol/L — ABNORMAL LOW (ref 101–111)
Creatinine, Ser: 9.94 mg/dL — ABNORMAL HIGH (ref 0.61–1.24)
GFR calc Af Amer: 6 mL/min — ABNORMAL LOW (ref >60)
GFR calc non Af Amer: 5 mL/min — ABNORMAL LOW (ref >60)
Glucose, Bld: 539 mg/dL (ref 65–99)
Potassium: 6 mmol/L — ABNORMAL HIGH (ref 3.5–5.1)
Sodium: 128 mmol/L — ABNORMAL LOW (ref 135–145)
Total Bilirubin: 0.6 mg/dL (ref 0.3–1.2)
Total Protein: 6.5 g/dL (ref 6.5–8.1)

## 2016-02-26 LAB — BASIC METABOLIC PANEL
Anion gap: 11 (ref 5–15)
BUN: 53 mg/dL — ABNORMAL HIGH (ref 6–20)
CO2: 25 mmol/L (ref 22–32)
Calcium: 9 mg/dL (ref 8.9–10.3)
Chloride: 100 mmol/L — ABNORMAL LOW (ref 101–111)
Creatinine, Ser: 7.03 mg/dL — ABNORMAL HIGH (ref 0.61–1.24)
GFR calc Af Amer: 9 mL/min — ABNORMAL LOW (ref >60)
GFR calc non Af Amer: 8 mL/min — ABNORMAL LOW (ref >60)
Glucose, Bld: 69 mg/dL (ref 65–99)
Potassium: 4.2 mmol/L (ref 3.5–5.1)
Sodium: 136 mmol/L (ref 135–145)

## 2016-02-26 LAB — GLUCOSE, CAPILLARY
Glucose-Capillary: 114 mg/dL — ABNORMAL HIGH (ref 65–99)
Glucose-Capillary: 117 mg/dL — ABNORMAL HIGH (ref 65–99)
Glucose-Capillary: 132 mg/dL — ABNORMAL HIGH (ref 65–99)
Glucose-Capillary: 148 mg/dL — ABNORMAL HIGH (ref 65–99)
Glucose-Capillary: 188 mg/dL — ABNORMAL HIGH (ref 65–99)
Glucose-Capillary: 258 mg/dL — ABNORMAL HIGH (ref 65–99)
Glucose-Capillary: 338 mg/dL — ABNORMAL HIGH (ref 65–99)
Glucose-Capillary: 356 mg/dL — ABNORMAL HIGH (ref 65–99)
Glucose-Capillary: 368 mg/dL — ABNORMAL HIGH (ref 65–99)
Glucose-Capillary: 511 mg/dL (ref 65–99)
Glucose-Capillary: 577 mg/dL (ref 65–99)
Glucose-Capillary: 64 mg/dL — ABNORMAL LOW (ref 65–99)
Glucose-Capillary: 68 mg/dL (ref 65–99)
Glucose-Capillary: 71 mg/dL (ref 65–99)
Glucose-Capillary: >600 mg/dL (ref 65–99)

## 2016-02-26 LAB — POCT I-STAT, CHEM 8
BUN: 27 mg/dL — ABNORMAL HIGH (ref 6–20)
Calcium, Ion: 0.84 mmol/L — CL (ref 1.15–1.40)
Chloride: 93 mmol/L — ABNORMAL LOW (ref 101–111)
Creatinine, Ser: 4.1 mg/dL — ABNORMAL HIGH (ref 0.61–1.24)
Glucose, Bld: 113 mg/dL — ABNORMAL HIGH (ref 65–99)
HCT: 26 % — ABNORMAL LOW (ref 39.0–52.0)
Hemoglobin: 8.8 g/dL — ABNORMAL LOW (ref 13.0–17.0)
Potassium: 3.1 mmol/L — ABNORMAL LOW (ref 3.5–5.1)
Sodium: 135 mmol/L (ref 135–145)
TCO2: 27 mmol/L (ref 0–100)

## 2016-02-26 LAB — VANCOMYCIN, RANDOM: Vancomycin Rm: 26

## 2016-02-26 LAB — PHOSPHORUS: Phosphorus: 6 mg/dL — ABNORMAL HIGH (ref 2.5–4.6)

## 2016-02-26 LAB — HEMOGLOBIN A1C
Hgb A1c MFr Bld: 5.7 % — ABNORMAL HIGH (ref 4.8–5.6)
Mean Plasma Glucose: 117 mg/dL

## 2016-02-26 MED ORDER — HEPARIN SODIUM (PORCINE) 1000 UNIT/ML DIALYSIS: 1000.0000 [IU] | INTRAMUSCULAR | Status: DC | PRN

## 2016-02-26 MED ORDER — LIDOCAINE-PRILOCAINE 2.5-2.5 % EX CREA: 1.0000 "application " | TOPICAL_CREAM | CUTANEOUS | Status: DC | PRN

## 2016-02-26 MED ORDER — LIDOCAINE HCL (PF) 1 % IJ SOLN: 5.0000 mL | INTRAMUSCULAR | Status: DC | PRN

## 2016-02-26 MED ORDER — HEPARIN SODIUM (PORCINE) 1000 UNIT/ML DIALYSIS
5000.0000 [IU] | Freq: Once | INTRAMUSCULAR | Status: DC
  Filled 2016-02-26: qty 5

## 2016-02-26 MED ORDER — ALTEPLASE 2 MG IJ SOLR: 2.0000 mg | Freq: Once | INTRAMUSCULAR | Status: DC | PRN

## 2016-02-26 MED ORDER — DIPHENHYDRAMINE HCL 50 MG/ML IJ SOLN
INTRAMUSCULAR | Status: AC
  Administered 2016-02-26: 12.5 mg via INTRAVENOUS
  Filled 2016-02-26: qty 1

## 2016-02-26 MED ORDER — VANCOMYCIN HCL IN DEXTROSE 1-5 GM/200ML-% IV SOLN: 1000.0000 mg | INTRAVENOUS | Status: DC

## 2016-02-26 MED ORDER — LEVOTHYROXINE SODIUM 100 MCG PO TABS
100.0000 ug | ORAL_TABLET | Freq: Every day | ORAL | Status: DC
  Administered 2016-02-28: 100 ug via ORAL
  Filled 2016-02-26 (×2): qty 1

## 2016-02-26 MED ORDER — SODIUM CHLORIDE 0.9 % IV SOLN: 100.0000 mL | INTRAVENOUS | Status: DC | PRN

## 2016-02-26 MED ORDER — GABAPENTIN 300 MG PO CAPS
300.0000 mg | ORAL_CAPSULE | Freq: Every day | ORAL | Status: DC
  Administered 2016-02-26 – 2016-02-27 (×2): 300 mg via ORAL
  Filled 2016-02-26 (×3): qty 1

## 2016-02-26 MED ORDER — HYDROMORPHONE HCL 2 MG PO TABS
2.0000 mg | ORAL_TABLET | Freq: Four times a day (QID) | ORAL | Status: DC | PRN
  Administered 2016-02-26 – 2016-02-28 (×4): 2 mg via ORAL
  Filled 2016-02-26 (×4): qty 1

## 2016-02-26 MED ORDER — HEPARIN SODIUM (PORCINE) 1000 UNIT/ML DIALYSIS: 5000.0000 [IU] | Freq: Once | INTRAMUSCULAR | Status: DC

## 2016-02-26 MED ORDER — PENTAFLUOROPROP-TETRAFLUOROETH EX AERO: 1.0000 "application " | INHALATION_SPRAY | CUTANEOUS | Status: DC | PRN

## 2016-02-26 MED ORDER — DEXTROSE 5 % IV SOLN: INTRAVENOUS | Status: DC

## 2016-02-26 MED ORDER — RENA-VITE PO TABS
1.0000 | ORAL_TABLET | Freq: Every day | ORAL | Status: DC
  Administered 2016-02-26 – 2016-02-27 (×2): 1 via ORAL
  Filled 2016-02-26 (×3): qty 1

## 2016-02-26 MED ORDER — CALCIUM ACETATE (PHOS BINDER) 667 MG PO CAPS
667.0000 mg | ORAL_CAPSULE | Freq: Three times a day (TID) | ORAL | Status: DC
  Administered 2016-02-26: 667 mg via ORAL
  Filled 2016-02-26: qty 1

## 2016-02-26 MED ORDER — DIPHENHYDRAMINE HCL 25 MG PO CAPS
25.0000 mg | ORAL_CAPSULE | Freq: Three times a day (TID) | ORAL | Status: DC | PRN
  Administered 2016-02-26 – 2016-02-27 (×2): 25 mg via ORAL
  Filled 2016-02-26 (×2): qty 1

## 2016-02-26 MED ORDER — CALCIUM ACETATE (PHOS BINDER) 667 MG PO CAPS
667.0000 mg | ORAL_CAPSULE | Freq: Every day | ORAL | Status: DC | PRN
Start: 2016-02-26 — End: 2016-02-28

## 2016-02-26 MED ORDER — MIDODRINE HCL 5 MG PO TABS
20.0000 mg | ORAL_TABLET | Freq: Once | ORAL | Status: AC
  Administered 2016-02-27: 20 mg via ORAL

## 2016-02-26 MED ORDER — VANCOMYCIN HCL IN DEXTROSE 1-5 GM/200ML-% IV SOLN
INTRAVENOUS | Status: AC
  Filled 2016-02-26: qty 200

## 2016-02-26 MED ORDER — MIDODRINE HCL 5 MG PO TABS
ORAL_TABLET | ORAL | Status: AC
  Filled 2016-02-26: qty 4

## 2016-02-26 MED ORDER — DIPHENHYDRAMINE HCL 50 MG/ML IJ SOLN
12.5000 mg | Freq: Once | INTRAMUSCULAR | Status: AC
  Administered 2016-02-26 (×2): 12.5 mg via INTRAVENOUS

## 2016-02-26 MED ORDER — DEXTROSE 5 % IV SOLN
INTRAVENOUS | Status: DC
  Administered 2016-02-26: 1000 mL via INTRAVENOUS

## 2016-02-26 MED ORDER — INSULIN GLARGINE 100 UNIT/ML ~~LOC~~ SOLN
20.0000 [IU] | Freq: Every day | SUBCUTANEOUS | Status: DC
  Administered 2016-02-26 – 2016-02-27 (×2): 20 [IU] via SUBCUTANEOUS
  Filled 2016-02-26 (×4): qty 0.2

## 2016-02-26 MED ORDER — VANCOMYCIN HCL IN DEXTROSE 1-5 GM/200ML-% IV SOLN
1000.0000 mg | INTRAVENOUS | Status: AC
  Administered 2016-02-27: 1000 mg via INTRAVENOUS
  Filled 2016-02-26: qty 200

## 2016-02-26 MED ORDER — PIPERACILLIN-TAZOBACTAM 3.375 G IVPB
3.3750 g | Freq: Two times a day (BID) | INTRAVENOUS | Status: DC
  Administered 2016-02-26 – 2016-02-27 (×2): 3.375 g via INTRAVENOUS
  Filled 2016-02-26 (×4): qty 50

## 2016-02-26 MED ORDER — INSULIN ASPART 100 UNIT/ML ~~LOC~~ SOLN
0.0000 [IU] | Freq: Three times a day (TID) | SUBCUTANEOUS | Status: DC
  Administered 2016-02-26: 9 [IU] via SUBCUTANEOUS
  Administered 2016-02-27: 1 [IU] via SUBCUTANEOUS
  Administered 2016-02-27: 7 [IU] via SUBCUTANEOUS
  Administered 2016-02-27: 3 [IU] via SUBCUTANEOUS
  Administered 2016-02-28: 9 [IU] via SUBCUTANEOUS

## 2016-02-26 NOTE — Progress Notes (Signed)
[facility] PROGRESS NOTE  Ford Peddie PNT:[id number] DOB: November 30, 1965 DOA: 02/25/2016  PCP: [name], MD  Brief History/Interval Summary: 50 year old Caucasian male with a past medical history of end-stage renal disease on hemodialysis, coronary artery disease, congestive heart failure, presented with complaints of fever, dizziness and generalized weakness. Apparently underwent procedure to his left arm graft recently. He did have an angioplasty and a stent placement. There was concern that patient may have become bacteremic. He was hospitalized for further management.  Reason for Visit: Sepsis  Consultants: Nephrology  Procedures: None  Antibiotics: Vancomycin and Zosyn  Subjective/Interval History: Patient feels slightly better this morning. Feels stronger. Denies any nausea, vomiting. No chest or shortness of breath. States that he developed a rash all over his body yesterday, which appears to be clearing up.  ROS: Denies any headaches  Objective:  Vital Signs  Vitals:   02/26/16 1130 02/26/16 1200 02/26/16 1211 02/26/16 1321  BP: (!) 104/50 (!) 103/49 (!) 109/50 (!) 102/40  Pulse: 85 85 86 85  Resp: (!) 23 (!) 21 (!) 21 (!) 22  Temp:   98.1 F (36.7 C)   TempSrc:   Oral   SpO2:   100% 96%  Weight:   120.9 kg (266 lb 8.6 oz)   Height:        Intake/Output Summary (Last 24 hours) at 02/26/16 1402 Last data filed at 02/26/16 1211  Gross per 24 hour  Intake          1318.75 ml  Output             2285 ml  Net          -966.25 ml   Filed Weights   02/25/16 2035 02/26/16 0701 02/26/16 1211  Weight: 118.3 kg (260 lb 11.2 oz) 122.7 kg (270 lb 8.1 oz) 120.9 kg (266 lb 8.6 oz)    General appearance: alert, cooperative, appears stated age and no distress Resp: clear to auscultation bilaterally Cardio: regular rate and rhythm, S1, S2 normal, no murmur, click, rub or gallop GI: soft, non-tender; bowel sounds normal; no masses,  no  organomegaly Neurologic: No focal neurological deficits.  Lab Results:  Data Reviewed: I have personally reviewed following labs and imaging studies  CBC:  Recent Labs Lab 02/24/16 1330 02/25/16 0735 02/26/16 0301 02/26/16 0839 02/26/16 1043  WBC  --  21.6* 15.7* 11.8*  --   NEUTROABS  --  20.0*  --   --   --   HGB 12.2* 10.3* 7.6* 7.8* 8.8*  HCT 36.0* 34.4* 25.2* 25.6* 26.0*  MCV  --  99.1 97.7 96.2  --   PLT  --  390 331 306  --     Basic Metabolic Panel:  Recent Labs Lab 02/25/16 0735 02/25/16 2128 02/26/16 0301 02/26/16 0815 02/26/16 1043 02/26/16 1251  NA 132* 128* 128* 136 135  --   K 5.6* 6.9* 6.0* 4.2 3.1*  --   CL 94* 93* 96* 100* 93*  --   CO2 22 20* 19* 25  --   --   GLUCOSE 204* 618* 539* 69 113*  --   BUN 54* 72* 78* 53* 27*  --   CREATININE 9.05* 9.50* 9.94* 7.03* 4.10*  --   CALCIUM 9.3 8.9 8.9 9.0  --   --   PHOS  --   --   --   --   --  6.0*    GFR: Estimated Creatinine Clearance: 27.7 mL/min (by C-G formula based  on SCr of 4.1 mg/dL (H)).  Liver Function Tests:  Recent Labs Lab 02/25/16 0735 02/26/16 0301  AST 92* 66*  ALT 40 35  ALKPHOS 82 88  BILITOT 0.9 0.6  PROT 7.6 6.5  ALBUMIN 3.0* 2.7*    Coagulation Profile:  Recent Labs Lab 02/25/16 1019  INR 1.26    HbA1C:  Recent Labs  02/25/16 1018  HGBA1C 5.7*    CBG:  Recent Labs Lab 02/26/16 0830 02/26/16 0934 02/26/16 1047 02/26/16 1154 02/26/16 1259  GLUCAP 71 117* 114* 132* 148*    Radiology Studies: Dg Chest Port 1 View  Result Date: 02/25/2016 CLINICAL DATA:  50 year old male with chest pain. Subsequent encounter. EXAM: PORTABLE CHEST 1 VIEW COMPARISON:  02/06/2016 chest x-ray.  01/28/2016 chest CT. FINDINGS: Cardiomegaly. Pulmonary vascular prominence most notable centrally. No segmental consolidation or pneumothorax. Left subclavian stent in place. Limited evaluation of mediastinal structures. IMPRESSION: Cardiomegaly. Pulmonary vascular prominence  most notable centrally. Electronically Signed   By: Genia Del M.D.   On: 02/25/2016 07:57     Medications:  Scheduled: . allopurinol  200 mg Oral Daily  . aspirin EC  81 mg Oral QHS  . atorvastatin  80 mg Oral q1800  . calcium acetate  667 mg Oral TID WC  . diphenhydrAMINE      . doxercalciferol  3 mcg Intravenous Q M,W,F-HD  . Ferric Citrate  2 g Oral TID WC  . gabapentin  300 mg Oral QHS  . heparin  5,000 Units Subcutaneous Q8H  . insulin aspart  0-9 Units Subcutaneous TID WC  . insulin glargine  20 Units Subcutaneous Daily  . [START ON 02/27/2016] levothyroxine  100 mcg Oral QAC breakfast  . metoCLOPramide  10 mg Oral TID PC & HS  . midodrine  20 mg Oral Q M,W,F  . multivitamin  1 tablet Oral QHS  . piperacillin-tazobactam (ZOSYN)  IV  3.375 g Intravenous Q12H  . prasugrel  10 mg Oral Daily  . rOPINIRole  2 mg Oral Q supper   Continuous: . dextrose     ZOX:[id number], calcium acetate, diphenhydrAMINE, fluticasone, HYDROmorphone (DILAUDID) injection, polyethylene glycol, promethazine  Assessment/Plan:  Principal Problem:   Sepsis ([place]) Active Problems:   ESRD (end stage renal disease) (HCC)   Fever and chills   HTN (hypertension)   Coronary artery disease involving native coronary artery of native heart with unstable angina pectoris (HCC)   HLD (hyperlipidemia)   Hyperkalemia   Diabetes mellitus (HCC)   FUO (fever of unknown origin)   Anemia of chronic disease   Severe sepsis (HCC)   Chronic diastolic heart failure ([place])    Sepsis Concern for bacteremia considering recent procedure to his AV graft. Continue vancomycin and Zosyn. Await culture data. Patient is hemodynamically stable.   End-stage renal disease Monday Wednesday Friday dialysis Nephrology is following. Patient to be dialyzed today.  Hyperkalemia Corrected this morning after dialysis.  Diabetes mellitus type II, on insulin pump Patient developed significant hyperglycemia yesterday due to  steroids that were given to him in the ED. He was given steroids as there was initially a plan to do a CT scan, which was eventually not done. Patient was placed on insulin infusion. His blood glucose levels extremely low this morning. Insulin infusion has been stopped. Lantus has been ordered. Continue to monitor CBGs closely. Sliding scale insulin coverage. HbA1c 5.7.  Anemia of chronic disease Hemoglobin is close to baseline. Nephrology was concerned about possible trend downwards over the last few months.  Reasonable to check fecal occult blood in stool. However, patient has not had any overt bleeding. If FOBT is positive, we'll recommend outpatient GI workup.   History of CAD Denies chest pain. EKG without acute changes. Recent non-STEMI with positive troponin. Heart cath with DES 01/01/16. Continue home meds. Beta blocker on hold due to hypotension.  Hypotension Patient actually has chronic hypotension and is on midodrine, which is being continued.  History of Diastolic heart failure Echo recently revealed mild LVH with an EF of 55% and grade 2 diastolic dysfunction. Medications include betablocker. He is a Monday Wednesday Friday dialysis patient.   Chronic pain Home medications include oral Dilaudid, Neurontin. Stable. Change Neurontin to renal dose.   Hyponatremia Pseudohyponatremia secondary to elevated glucose levels. Corrected this morning.   DVT Prophylaxis: Subcutaneous heparin    Code Status: Full code  Family Communication: Discussed with the patient  Disposition Plan: Management as outlined above. Await culture data. Await stabilization of glucose levels. Mobilize when improved.    LOS: 1 day   [facility] Pager [phone] 02/26/2016, 2:02 PM  If 7PM-7AM, please contact night-coverage at [email or web address], password [facility]

## 2016-02-26 NOTE — Progress Notes (Signed)
[facility] Progress Note   Subjective:  "I'd be fine if my BS would go back up". On HD, no complaints at present other than being hungry. Denies chest pain/SOB. No fevers this AM.    Objective Vitals:   02/25/16 1800 02/25/16 1830 02/25/16 2035 02/26/16 0627  BP: (!) 127/35 (!) 130/47 (!) 127/54 (!) 117/45  Pulse: 93 97 93 87  Resp: 20 15 18 18   Temp:   97.8 F (36.6 C) 97.6 F (36.4 C)  TempSrc:   Oral Oral  SpO2: 100% 100% 99% 100%  Weight:   118.3 kg (260 lb 11.2 oz)   Height:   5\' 9"  (1.753 m)    Physical Exam General: Pleasant, well nourished, NAD Heart: S1,S2, RRR No M/RG Lungs: BBS dec in bases, otherwise CTA Abdomen: Obese, non-tender Extremities: 1+ edeme RLE, R BKA No stump edema but R thigh tight.  Dialysis Access: LUA AVF cannulated at present  Dialysis: MWF [facility] 4h 48min  115kg (making it to 116-117)  2/2.25 bath  P4  Hep 5000  LUA AVF (worked on 11/1 by VVS) Mircera 225 last 11/1, due 11/15 Hect 3 ug  9.5/ 3.8/ 152 Auryxia/ phoslo binders   Assessment/Plan: 1. Hypotension/Chills/R/O Sepsis: Per primary. On Vanc/Zosyn. BC in progress. WBC 15.7 Procalcitonin 5.03 Tmax 101.2 yesterday, 97.6 currently.  2. Hyperglycemia: On glucose stabilizer. Anion gap 13. Co2 19 Insulin drip currently off. BS [phone] last BS 64. Has been on NS @ 75cc/hr during night-now D51/2NS @ 50cc/HR. Stop D51/2-change to D5W at 50cc/Hr.  3 Hyperkalemia: K+ 6.0 this AM. on 1.0K bath. Recheck K+ in 1 hour. 4. ESRD -MWF HP. On HD today on schedule. Probably will not be able to remove volume. Extra tx tomorrow. Be sure he gets midodrine prior to HD.  5 Anemia - HGB 7.6. HGB was 12.2 on adm. 10.3. Yesterday. Rec'd ESA dose 02/24/16.  Repeat HGB 7.8. H/O GI bleed. Check FOB. Follow HGB. Will discuss with primary.  4. Secondary hyperparathyroidism - Ca 8.9 C Ca 9.4 Add phos to labs. Takes Auryxia 2 tabs PO TID AC.Calcium acetate 667 mg PO TID 1 cap with snack.  5.  HTN/volume - BP 98/54. Has not had midodrine. Give now. Wt 122.7. Is 7.7 above OP EDW. Hypotensive at present. Goal set at 3000. Stop D51/2-change to D5W at 50cc/Hr.. Metoprolol on hold. 6. Nutrition - Albumin 2.7. NPO at present. Renal/Carb mod diet when able to eat.  7.H/O CAD PTCI to Dx 09/17 8. H/O Diastolic HF.   Rita H. Brown NP-C 02/26/2016, 8:23 AM  [facility] [phone]  Pt seen, examined and agree w A/P as above. Looks a little better.  Awaiting blood cx results.  Presentation seemed most c/w an acute infectious episode.  BP's better, up 5-6 kg from IVF resuscitation , will prob need extra HD on Sat to get this vol off.  Kelly Splinter MD [facility] pager [phone]   02/26/2016, 11:24 AM    Additional Objective Labs: Basic Metabolic Panel:  Recent Labs Lab 02/25/16 0735 02/25/16 2128 02/26/16 0301  NA 132* 128* 128*  K 5.6* 6.9* 6.0*  CL 94* 93* 96*  CO2 22 20* 19*  GLUCOSE 204* 618* 539*  BUN 54* 72* 78*  CREATININE 9.05* 9.50* 9.94*  CALCIUM 9.3 8.9 8.9   Liver Function Tests:  Recent Labs Lab 02/25/16 0735 02/26/16 0301  AST 92* 66*  ALT 40 35  ALKPHOS 82 88  BILITOT 0.9 0.6  PROT 7.6 6.5  ALBUMIN 3.0* 2.7*   No results for input(s): LIPASE, AMYLASE in the last 168 hours. CBC:  Recent Labs Lab 02/24/16 1330 02/25/16 0735 02/26/16 0301  WBC  --  21.6* 15.7*  NEUTROABS  --  20.0*  --   HGB 12.2* 10.3* 7.6*  HCT 36.0* 34.4* 25.2*  MCV  --  99.1 97.7  PLT  --  390 331   Blood Culture    Component Value Date/Time   SDES BLOOD DIALYSIS 02/03/2016 1322   SDES BLOOD DIALYSIS 02/03/2016 1322   SPECREQUEST BOTTLES DRAWN AEROBIC AND ANAEROBIC 10CC 02/03/2016 1322   SPECREQUEST BOTTLES DRAWN AEROBIC AND ANAEROBIC 5CC 02/03/2016 1322   CULT NO GROWTH 5 DAYS 02/03/2016 1322   CULT NO GROWTH 5 DAYS 02/03/2016 1322   REPTSTATUS 02/08/2016 FINAL 02/03/2016 1322   REPTSTATUS 02/08/2016 FINAL 02/03/2016 1322     Cardiac Enzymes: No results for input(s): CKTOTAL, CKMB, CKMBINDEX, TROPONINI in the last 168 hours. CBG:  Recent Labs Lab 02/26/16 0411 02/26/16 0517 02/26/16 0621 02/26/16 0744 02/26/16 0802  GLUCAP 368* 258* 188* 68 64*   Iron Studies: No results for input(s): IRON, TIBC, TRANSFERRIN, FERRITIN in the last 72 hours. @lablastinr3 @ Studies/Results: Dg Chest Port 1 View  Result Date: 02/25/2016 CLINICAL DATA:  50 year old male with chest pain. Subsequent encounter. EXAM: PORTABLE CHEST 1 VIEW COMPARISON:  02/06/2016 chest x-ray.  01/28/2016 chest CT. FINDINGS: Cardiomegaly. Pulmonary vascular prominence most notable centrally. No segmental consolidation or pneumothorax. Left subclavian stent in place. Limited evaluation of mediastinal structures. IMPRESSION: Cardiomegaly. Pulmonary vascular prominence most notable centrally. Electronically Signed   By: Genia Del M.D.   On: 02/25/2016 07:57   Medications: . sodium chloride 75 mL/hr (02/26/16 0037)  . dextrose 5 % and 0.45% NaCl    . insulin (NOVOLIN-R) infusion Stopped (02/26/16 0745)   . allopurinol  200 mg Oral Daily  . aspirin EC  81 mg Oral QHS  . atorvastatin  80 mg Oral q1800  . calcium acetate  667 mg Oral TID WC   And  . calcium acetate  667 mg Oral With snacks  . doxercalciferol  3 mcg Intravenous Q M,W,F-HD  . fenofibrate  54 mg Oral Daily  . Ferric Citrate  2 g Oral TID WC  . gabapentin  300 mg Oral TID  . heparin  5,000 Units Subcutaneous Q8H  . levothyroxine  100 mcg Oral QHS  . metoCLOPramide  10 mg Oral TID PC & HS  . midodrine  20 mg Oral Q M,W,F  . multivitamin  1 tablet Oral Daily  . piperacillin-tazobactam (ZOSYN)  IV  3.375 g Intravenous Q12H  . prasugrel  10 mg Oral Daily  . rOPINIRole  2 mg Oral Q supper  . vancomycin  1,000 mg Intravenous Q M,W,F-HD

## 2016-02-26 NOTE — Progress Notes (Addendum)
Inpatient Diabetes Program Recommendations  AACE/ADA: New Consensus Statement on Inpatient Glycemic Control (2015)  Target Ranges:  Prepandial:   less than 140 mg/dL      Peak postprandial:   less than 180 mg/dL (1-2 hours)      Critically ill patients:  140 - 180 mg/dL   Lab Results  Component Value Date   GLUCAP 71 02/26/2016   HGBA1C 5.7 (H) 02/25/2016    Review of Glycemic Control:  Results for Dale [name], Dale [name] (MRN [id number]) as of 02/26/2016 08:43  Ref. Range 02/26/2016 00:51 02/26/2016 02:06 02/26/2016 03:09 02/26/2016 04:11 02/26/2016 05:17 02/26/2016 06:21 02/26/2016 07:44 02/26/2016 08:02 02/26/2016 08:30  Glucose-Capillary Latest Ref Range: 65 - 99 mg/dL >600 (HH) 577 (HH) 511 (HH) 368 (H) 258 (H) 188 (H) 68 64 (L) 71   Diabetes history: Type 1 diabetes Outpatient Diabetes medications:  Insulin pump: 24-1.3units/hr                        3a-1.9 units/hr                        8a-1.45 units/hr                       12n-1.5 units/hr Current orders for Inpatient glycemic control:  IV insulin due to blood sugars increasing to > 600 last PM.  Of note, patient did received Solumedrol 125 mg x 2.  Note that patient's total basal in 24 hours is 37.2 units.  However based on patient's most recent visit with his endocrinologist, Dr. Starleen Blue, he has been having wide fluctuations in blood sugars and lows and night.  According to patient on 11/2, he removed pump prior to coming to the hospital. Patient may want to resume insulin pump today in order to have more control of management while in the hospital?  Will discuss with patient/RN.  Note that blood sugars have now come down with insulin drip however now < goal.  Patient did receive Lantus 15 units at 1742 on 02/25/16.     Inpatient Diabetes Program Recommendations:    Spoke with RN.  Patient is now in dialysis.  Spoke with RN, she states that patient's blood sugar down to 71 and drip rate is .1 units/hr.  Recommended that she call Dr. [name] for  further orders as insulin drip can possibly be stopped at this time?  Will follow. Again patient may want to bring in insulin pump.  If not will likely need increase of Lantus to 25 units, Sensitive correction and Novolog meal coverage 4 units tid with meals.  Thanks, Adah Perl, RN, BC-ADM Inpatient Diabetes Coordinator Pager [phone] (8a-5p)

## 2016-02-26 NOTE — Progress Notes (Signed)
[facility]  Patient Status: Active (receiving services up to time of hospitalization)  [facility] is providing the following services: RN, PT and OT  If patient discharges after hours, please call [phone].   Dale [name] 02/26/2016, 12:08 PM

## 2016-02-26 NOTE — Progress Notes (Signed)
ANTIBIOTIC CONSULT NOTE - INITIAL  Pharmacy Consult for Vanco Indication: cellulitis  Allergies  Allergen Reactions  . Contrast Media [Iodinated Diagnostic Agents] Rash and Itching    Systemic itching and transient rash appearance within hours of receiving contrast  Systemic itching and transient rash appearance within hours of receiving contrast  Redman Syndrome  . Rocephin [Ceftriaxone] Itching  . Amoxicillin Itching and Rash    .Has patient had a PCN reaction causing immediate rash, facial/tongue/throat swelling, SOB or lightheadedness with hypotension: Yes Has patient had a PCN reaction causing severe rash involving mucus membranes or skin necrosis: No Has patient had a PCN reaction that required hospitalization No Has patient had a PCN reaction occurring within the last 10 years: No If all of the above answers are "NO", then may proceed with Cephalosporin use.  Kary Kos [Ticagrelor] Nausea And Vomiting  . Buprenorphine Hcl Nausea Only and Hives  . Clopidogrel Rash  . Hydrocodone Rash  . Morphine Nausea Only, Rash and Hives  . Adhesive [Tape] Other (See Comments) and Itching    Blisters on arm from adhesive tape at dialysis Blisters on arm from adhesive tape at dialysis  . Prednisone Other (See Comments)    Increases sugar levels  . Ciprofloxacin Itching and Rash  . Clindamycin/Lincomycin Hives and Itching  . Codeine Hives, Itching and Rash  . Doxycycline Rash  . Enalapril Rash    cough  . Levofloxacin Hives and Itching  . Mobic [Meloxicam] Hives and Rash  . Oxycodone Rash  . Vasotec Cough    Patient Measurements: Height: 5\' 9"  (175.3 cm) Weight: 266 lb 8.6 oz (120.9 kg) IBW/kg (Calculated) : 70.7 Adjusted Body Weight:    Vital Signs: Temp: 98.7 F (37.1 C) (11/03 1837) Temp Source: Oral (11/03 1837) BP: 112/55 (11/03 1837) Pulse Rate: 98 (11/03 1837) Intake/Output from previous day: 11/02 0701 - 11/03 0700 In: 4368.8 [I.V.:268.8; IV Piggyback:4100] Out: -   Intake/Output from this shift: Total I/O In: 240 [P.O.:240] Out: 2286 [Other:2285; Stool:1]  Labs:  Recent Labs  02/25/16 0735  02/26/16 0301 02/26/16 0815 02/26/16 0839 02/26/16 1043  WBC 21.6*  --  15.7*  --  11.8*  --   HGB 10.3*  --  7.6*  --  7.8* 8.8*  PLT 390  --  331  --  306  --   CREATININE 9.05*  < > 9.94* 7.03*  --  4.10*  < > = values in this interval not displayed. Estimated Creatinine Clearance: 27.7 mL/min (by C-G formula based on SCr of 4.1 mg/dL (H)).  Recent Labs  02/26/16 1706  VANCORANDOM 26     Microbiology:   Medical History: Past Medical History:  Diagnosis Date  . Anemia of chronic disease   . Arthritis    "hands, right knee" (03/19/2014)  . CAD (coronary artery disease)    a. Abnl nuc ->LHC (11/15):  mid to dist Dx 80%, mid RCA 99% (functional CTO), dist RCA 80% >> PCI: balloon angioplasty to mid RCA (could not deliver stent) - staged PCI Rotoblator atherectomy/DES to [facility]. b. NSTEMI 12/2015 s/p DES to diagonal.  . Cholecystitis    a. 08/27/2011  . Chronic diastolic CHF (congestive heart failure) ([place])    a. Echo 3/13:  EF 55-60%;  b. Echo (11/15):  Mild LVH, EF 60-65%, no RWMA, Gr 1 DD, MAC, mild LAE, normal RVF, mild RAE;  c.  Echo 5/16:  severe LVH, EF 55-60%, no RWMA, Gr 1 DD, MAC, mild LAE  . Claustrophobia  when things get around his face.   . Difficult intubation    only once in 2015 at [facility] haven't had a problem since then  . ESRD (end stage renal disease) ([place])    a. 1995 s/p cadaveric transplant w/ susbequent failure after 18 yrs;  b.Dialysis initiated 07/2011 [facility] M-W-F  . Fibromyalgia   . Gastroparesis   . GERD (gastroesophageal reflux disease)   . Gout    PMH  . History of blood transfusion   . HLD (hyperlipidemia)   . Hypertension   . Hypothyroidism   . Inguinal lymphadenopathy    a. bilateral - s/p biopsy 07/2011  . Insulin dependent diabetes mellitus (HCC)    Type I  . Morbid obesity ([place])   . OSA  (obstructive sleep apnea)    adjustable bed  . PAD (peripheral artery disease) (HCC)    a. s/p L BKA.  [name] Kitchen Pericardial effusion    a.  Small by CT 08/22/11;  b.  Large by CT 08/27/11. c. Not seen on 11/2015 echo.  . Pneumonia ~ 2007  . Tracheobronchomalacia    Assessment:  ID:  Vanc PTA for abdominal wall cellulitis - started 10/13 with a planned 2 week LOT however per discussion with the patient's HD center, pt has still been receiving with last dose outpatient on 11/1. Pt was re-loaded with Vanc 2g this admission and also started on  Zosyn for r/o sepsis. Had HD today 11/3, plan extra HD 11/4.  Vanc 10/3 PTA >> (11/2 reloaded 2g)>> Zosyn 11/2 >>  Levels: 11/3 VR 26 (after 2g reload on 11/2): con't Vanco 1g MWF  Goal of Therapy:  Vanco level <20 post-HD prior to redosing  Plan:  Vancomycin 1g IV x 1 with extra HD tomorrow Vancomycin 1g IV MWF  Josette Shimabukuro S. Alford Highland, PharmD, BCPS Clinical Staff Pharmacist Pager [phone]  Eilene Ghazi Stillinger 02/26/2016,6:40 PM

## 2016-02-26 NOTE — Progress Notes (Signed)
Pt came to HD on the glucostabilizer and was noted have blood sugars of 68; Insulin dripped stopped and rechecked 15 mins  CBG=64. Started  D5 1/2 % NS at  76ml/hr. Dr. Roney Jaffe ordered to change to 5% Dextrose at 69ml/hr. Tarri Glenn from Diabetic Coordinator called to have the insulin stopped and to call Dr. Bonnielee Haff and make him aware. Dr. [name] ok stopping the insulin. Cbg went to 71 when Dr. [name] was called; rechecked CBG  15 min later it was 114 and then 132. Pt alert and oriented x4 when he left the unit.

## 2016-02-27 DIAGNOSIS — R509 Fever, unspecified: Secondary | ICD-10-CM

## 2016-02-27 DIAGNOSIS — N186 End stage renal disease: Secondary | ICD-10-CM

## 2016-02-27 DIAGNOSIS — I5032 Chronic diastolic (congestive) heart failure: Secondary | ICD-10-CM

## 2016-02-27 LAB — BASIC METABOLIC PANEL
Anion gap: 12 (ref 5–15)
BUN: 37 mg/dL — ABNORMAL HIGH (ref 6–20)
CO2: 26 mmol/L (ref 22–32)
Calcium: 8.5 mg/dL — ABNORMAL LOW (ref 8.9–10.3)
Chloride: 91 mmol/L — ABNORMAL LOW (ref 101–111)
Creatinine, Ser: 6.15 mg/dL — ABNORMAL HIGH (ref 0.61–1.24)
GFR calc Af Amer: 11 mL/min — ABNORMAL LOW (ref >60)
GFR calc non Af Amer: 10 mL/min — ABNORMAL LOW (ref >60)
Glucose, Bld: 326 mg/dL — ABNORMAL HIGH (ref 65–99)
Potassium: 5.1 mmol/L (ref 3.5–5.1)
Sodium: 129 mmol/L — ABNORMAL LOW (ref 135–145)

## 2016-02-27 LAB — RENAL FUNCTION PANEL
Albumin: 2.5 g/dL — ABNORMAL LOW (ref 3.5–5.0)
Anion gap: 12 (ref 5–15)
BUN: 36 mg/dL — ABNORMAL HIGH (ref 6–20)
CO2: 25 mmol/L (ref 22–32)
Calcium: 8.4 mg/dL — ABNORMAL LOW (ref 8.9–10.3)
Chloride: 92 mmol/L — ABNORMAL LOW (ref 101–111)
Creatinine, Ser: 5.72 mg/dL — ABNORMAL HIGH (ref 0.61–1.24)
GFR calc Af Amer: 12 mL/min — ABNORMAL LOW (ref >60)
GFR calc non Af Amer: 10 mL/min — ABNORMAL LOW (ref >60)
Glucose, Bld: 320 mg/dL — ABNORMAL HIGH (ref 65–99)
Phosphorus: 4.8 mg/dL — ABNORMAL HIGH (ref 2.5–4.6)
Potassium: 4.9 mmol/L (ref 3.5–5.1)
Sodium: 129 mmol/L — ABNORMAL LOW (ref 135–145)

## 2016-02-27 LAB — CBC
HCT: 26.3 % — ABNORMAL LOW (ref 39.0–52.0)
Hemoglobin: 7.9 g/dL — ABNORMAL LOW (ref 13.0–17.0)
MCH: 29.4 pg (ref 26.0–34.0)
MCHC: 30 g/dL (ref 30.0–36.0)
MCV: 97.8 fL (ref 78.0–100.0)
Platelets: 304 10*3/uL (ref 150–400)
RBC: 2.69 MIL/uL — ABNORMAL LOW (ref 4.22–5.81)
RDW: 19 % — ABNORMAL HIGH (ref 11.5–15.5)
WBC: 8 10*3/uL (ref 4.0–10.5)

## 2016-02-27 LAB — GLUCOSE, CAPILLARY
Glucose-Capillary: 335 mg/dL — ABNORMAL HIGH (ref 65–99)
Glucose-Capillary: 133 mg/dL — ABNORMAL HIGH (ref 65–99)
Glucose-Capillary: 229 mg/dL — ABNORMAL HIGH (ref 65–99)
Glucose-Capillary: 228 mg/dL — ABNORMAL HIGH (ref 65–99)

## 2016-02-27 LAB — HEPATITIS B SURFACE ANTIGEN: Hepatitis B Surface Ag: NEGATIVE

## 2016-02-27 MED ORDER — INSULIN GLARGINE 100 UNIT/ML ~~LOC~~ SOLN
5.0000 [IU] | Freq: Once | SUBCUTANEOUS | Status: AC
  Administered 2016-02-27: 5 [IU] via SUBCUTANEOUS
  Filled 2016-02-27: qty 0.05

## 2016-02-27 MED ORDER — HYDROMORPHONE HCL 1 MG/ML IJ SOLN
0.5000 mg | Freq: Four times a day (QID) | INTRAMUSCULAR | Status: DC | PRN
  Administered 2016-02-27 – 2016-02-28 (×4): 0.5 mg via INTRAVENOUS
  Filled 2016-02-27 (×4): qty 1

## 2016-02-27 MED ORDER — SACCHAROMYCES BOULARDII 250 MG PO CAPS
250.0000 mg | ORAL_CAPSULE | Freq: Two times a day (BID) | ORAL | Status: DC
  Administered 2016-02-27 – 2016-02-28 (×3): 250 mg via ORAL
  Filled 2016-02-27 (×3): qty 1

## 2016-02-27 MED ORDER — VANCOMYCIN HCL IN DEXTROSE 1-5 GM/200ML-% IV SOLN
INTRAVENOUS | Status: AC
  Administered 2016-02-27: 1000 mg via INTRAVENOUS
  Filled 2016-02-27: qty 200

## 2016-02-27 MED ORDER — CALCIUM ACETATE (PHOS BINDER) 667 MG PO CAPS
1334.0000 mg | ORAL_CAPSULE | Freq: Three times a day (TID) | ORAL | Status: DC
  Administered 2016-02-27 – 2016-02-28 (×2): 1334 mg via ORAL
  Filled 2016-02-27 (×2): qty 2

## 2016-02-27 MED ORDER — INSULIN GLARGINE 100 UNIT/ML ~~LOC~~ SOLN
25.0000 [IU] | Freq: Every day | SUBCUTANEOUS | Status: DC
  Administered 2016-02-28: 25 [IU] via SUBCUTANEOUS
  Filled 2016-02-27: qty 0.25

## 2016-02-27 NOTE — Progress Notes (Signed)
[facility] PROGRESS NOTE  Dale [name] XBJ:[id number] DOB: 1966-01-31 DOA: 02/25/2016  PCP: [name], MD  Brief History/Interval Summary: 50 year old Caucasian male with a past medical history of end-stage renal disease on hemodialysis, coronary artery disease, congestive heart failure, presented with complaints of fever, dizziness and generalized weakness. Apparently underwent procedure to his left arm graft recently. He did have an angioplasty and a stent placement. There was concern that patient may have become bacteremic. He was hospitalized for further management.  Reason for Visit: Sepsis  Consultants: Nephrology  Procedures: None  Antibiotics: Vancomycin and Zosyn will be stopped on 11/4  Subjective/Interval History: Patient overall feels well. He states that his back pain has not been controlled with just oral pain medicines. He is requesting intravenous medication. He denies any nausea, vomiting. Overall, he feels better.   ROS: Denies any headaches  Objective:  Vital Signs  Vitals:   02/27/16 0945 02/27/16 1000 02/27/16 1030 02/27/16 1045  BP: (!) 147/61 126/66 (!) 134/58 (!) 128/58  Pulse: 84 83 87 86  Resp:      Temp:      TempSrc:      SpO2:      Weight:      Height:        Intake/Output Summary (Last 24 hours) at 02/27/16 1052 Last data filed at 02/27/16 0500  Gross per 24 hour  Intake              240 ml  Output             2286 ml  Net            -2046 ml   Filed Weights   02/26/16 1211 02/26/16 2119 02/27/16 0810  Weight: 120.9 kg (266 lb 8.6 oz) 116.3 kg (256 lb 4.8 oz) 121.6 kg (268 lb 1.3 oz)    General appearance: alert, cooperative, appears stated age and no distress Resp: clear to auscultation bilaterally Cardio: regular rate and rhythm, S1, S2 normal, no murmur, click, rub or gallop GI: soft, non-tender; bowel sounds normal; no masses,  no organomegaly Neurologic: No focal neurological deficits.  Lab Results:  Data  Reviewed: I have personally reviewed following labs and imaging studies  CBC:  Recent Labs Lab 02/25/16 0735 02/26/16 0301 02/26/16 0839 02/26/16 1043 02/27/16 0634  WBC 21.6* 15.7* 11.8*  --  8.0  NEUTROABS 20.0*  --   --   --   --   HGB 10.3* 7.6* 7.8* 8.8* 7.9*  HCT 34.4* 25.2* 25.6* 26.0* 26.3*  MCV 99.1 97.7 96.2  --  97.8  PLT 390 331 306  --  308    Basic Metabolic Panel:  Recent Labs Lab 02/25/16 2128 02/26/16 0301 02/26/16 0815 02/26/16 1043 02/26/16 1251 02/27/16 0634 02/27/16 0835  NA 128* 128* 136 135  --  129* 129*  K 6.9* 6.0* 4.2 3.1*  --  5.1 4.9  CL 93* 96* 100* 93*  --  91* 92*  CO2 20* 19* 25  --   --  26 25  GLUCOSE 618* 539* 69 113*  --  326* 320*  BUN 72* 78* 53* 27*  --  37* 36*  CREATININE 9.50* 9.94* 7.03* 4.10*  --  6.15* 5.72*  CALCIUM 8.9 8.9 9.0  --   --  8.5* 8.4*  PHOS  --   --   --   --  6.0*  --  4.8*    GFR: Estimated Creatinine Clearance: 19.9 mL/min (by C-G  formula based on SCr of 5.72 mg/dL (H)).  Liver Function Tests:  Recent Labs Lab 02/25/16 0735 02/26/16 0301 02/27/16 0835  AST 92* 66*  --   ALT 40 35  --   ALKPHOS 82 88  --   BILITOT 0.9 0.6  --   PROT 7.6 6.5  --   ALBUMIN 3.0* 2.7* 2.5*    Coagulation Profile:  Recent Labs Lab 02/25/16 1019  INR 1.26    HbA1C:  Recent Labs  02/25/16 1018  HGBA1C 5.7*    CBG:  Recent Labs Lab 02/26/16 1154 02/26/16 1259 02/26/16 1633 02/26/16 2133 02/27/16 0830  GLUCAP 132* 148* 356* 338* 335*    Radiology Studies: No results found.   Medications:  Scheduled: . allopurinol  200 mg Oral Daily  . aspirin EC  81 mg Oral QHS  . atorvastatin  80 mg Oral q1800  . calcium acetate  1,334 mg Oral TID WC  . doxercalciferol  3 mcg Intravenous Q M,W,F-HD  . Ferric Citrate  2 g Oral TID WC  . gabapentin  300 mg Oral QHS  . heparin  5,000 Units Subcutaneous Q8H  . insulin aspart  0-9 Units Subcutaneous TID WC  . [START ON 02/28/2016] insulin glargine  25  Units Subcutaneous Daily  . insulin glargine  5 Units Subcutaneous Once  . levothyroxine  100 mcg Oral QAC breakfast  . metoCLOPramide  10 mg Oral TID PC & HS  . midodrine  20 mg Oral Q M,W,F  . multivitamin  1 tablet Oral QHS  . piperacillin-tazobactam (ZOSYN)  IV  3.375 g Intravenous Q12H  . prasugrel  10 mg Oral Daily  . rOPINIRole  2 mg Oral Q supper  . saccharomyces boulardii  250 mg Oral BID  . vancomycin      . vancomycin  1,000 mg Intravenous Q Sat-HD  . [START ON 02/29/2016] vancomycin  1,000 mg Intravenous Q M,W,F-HD   Continuous:   AYT:KZSWFUXNA, calcium acetate, diphenhydrAMINE, fluticasone, HYDROmorphone, polyethylene glycol, promethazine  Assessment/Plan:  Principal Problem:   Sepsis ([place]) Active Problems:   ESRD (end stage renal disease) (HCC)   Fever and chills   HTN (hypertension)   Coronary artery disease involving native coronary artery of native heart with unstable angina pectoris (HCC)   HLD (hyperlipidemia)   Hyperkalemia   Diabetes mellitus (HCC)   FUO (fever of unknown origin)   Anemia of chronic disease   Severe sepsis (HCC)   Chronic diastolic heart failure ([place])    Sepsis Patient was admitted due to concern for bacteremia considering recent procedure to his AV graft. Patient was placed on vancomycin and Zosyn. Blood cultures are negative so far. He has been afebrile. WBCs normal. Okay to stop his antibiotics for now and monitor for another day. No other foci of infection has been noted.   End-stage renal disease Monday Wednesday Friday dialysis Nephrology is following. Patient to be dialyzed Friday and Saturday.  Hyperkalemia Corrected after dialysis.  Diabetes mellitus type II, on insulin pump Patient developed significant hyperglycemia at the time of admission due to steroids that were given to him in the ED. He was given steroids as there was initially a plan to do a CT scan, which was eventually not done. Patient was briefly placed on  intravenous insulin. Blood sugars stabilize. He was taken off of IV insulin due to low blood sugars and then transitioned to Lantus. He does not have his insulin pump in the hospital. CBGs are stable for the most  part. Continue to monitor CBGs closely. Sliding scale insulin coverage. HbA1c 5.7.  Anemia of chronic disease Hemoglobin is close to baseline. Nephrology was concerned about possible trend downwards over the last few months. Reasonable to check fecal occult blood in stool. However, patient has not had any overt bleeding. Patient was supposed to have a colonoscopy as an outpatient. He was recently seen by Dr. Silverio Decamp with gastroenterology.   History of CAD Denies chest pain. EKG without acute changes. Recent non-STEMI with positive troponin. Heart cath with DES 01/01/16. Continue home meds. Beta blocker on hold due to hypotension.  Hypotension Patient actually has chronic hypotension and is on midodrine, which is being continued.  History of Diastolic heart failure Echo recently revealed mild LVH with an EF of 55% and grade 2 diastolic dysfunction. Medications include betablocker. He is a Monday Wednesday Friday dialysis patient.   Chronic pain Home medications include oral Dilaudid, Neurontin. Stable. Change Neurontin to renal dose.   Hyponatremia Some degree of pseudohyponatremia secondary to elevated glucose levels.    DVT Prophylaxis: Subcutaneous heparin    Code Status: Full code  Family Communication: Discussed with the patient  Disposition Plan: Management as outlined above. Stop antibiotics and monitor. If remains stable, he could be discharged tomorrow.    LOS: 2 days   [facility] Pager [phone] 02/27/2016, 10:52 AM  If 7PM-7AM, please contact night-coverage at [email or web address], password [email or web address]

## 2016-02-27 NOTE — Progress Notes (Signed)
[facility] Progress Note   Subjective: "I'm doing OK, I just didn't sleep last night" On HD-extra tx today for volume removal.    Objective Vitals:   02/27/16 0455 02/27/16 0810 02/27/16 0818 02/27/16 0824  BP: (!) 135/58 (!) 153/63 (!) 157/71 (!) 158/70  Pulse: 87 83 83 85  Resp: 18 (!) 23    Temp: 98.8 F (37.1 C) 98.4 F (36.9 C)    TempSrc: Oral Oral    SpO2: 100% 99%    Weight:  121.6 kg (268 lb 1.3 oz)    Height:       General: Well nourished, NAD Heart: S1,S2, RRR No M/RG. SR on monitor.  Lungs: BBS CTA Abdomen: Obese, non-tender Extremities: Trace edeme RLE, R BKA No stump edema but R thigh tight.  Dialysis Access: LUA AVF cannulated at present  Dialysis:MWF [facility] 4h 30min 115kg (making it to 116-117) 2/2.25 bath P4 Hep 5000 LUA AVF (worked on 11/1 by [facility]) Mircera 225 last 11/1, due 11/15 Hect 3 ug 9.5/ 3.8/ 152 Auryxia/ phoslo binders   Additional Objective Labs: Basic Metabolic Panel:  Recent Labs Lab 02/26/16 0301 02/26/16 0815 02/26/16 1043 02/26/16 1251 02/27/16 0634  NA 128* 136 135  --  129*  K 6.0* 4.2 3.1*  --  5.1  CL 96* 100* 93*  --  91*  CO2 19* 25  --   --  26  GLUCOSE 539* 69 113*  --  326*  BUN 78* 53* 27*  --  37*  CREATININE 9.94* 7.03* 4.10*  --  6.15*  CALCIUM 8.9 9.0  --   --  8.5*  PHOS  --   --   --  6.0*  --    Liver Function Tests:  Recent Labs Lab 02/25/16 0735 02/26/16 0301  AST 92* 66*  ALT 40 35  ALKPHOS 82 88  BILITOT 0.9 0.6  PROT 7.6 6.5  ALBUMIN 3.0* 2.7*   No results for input(s): LIPASE, AMYLASE in the last 168 hours. CBC:  Recent Labs Lab 02/25/16 0735 02/26/16 0301 02/26/16 0839 02/26/16 1043 02/27/16 0634  WBC 21.6* 15.7* 11.8*  --  8.0  NEUTROABS 20.0*  --   --   --   --   HGB 10.3* 7.6* 7.8* 8.8* 7.9*  HCT 34.4* 25.2* 25.6* 26.0* 26.3*  MCV 99.1 97.7 96.2  --  97.8  PLT 390 331 306  --  304   Blood Culture    Component Value Date/Time   SDES  BLOOD RIGHT HAND 02/25/2016 0745   SPECREQUEST BOTTLES DRAWN AEROBIC AND ANAEROBIC 5CC 02/25/2016 0745   CULT NO GROWTH 1 DAY 02/25/2016 0745   REPTSTATUS PENDING 02/25/2016 0745    Cardiac Enzymes: No results for input(s): CKTOTAL, CKMB, CKMBINDEX, TROPONINI in the last 168 hours. CBG:  Recent Labs Lab 02/26/16 1154 02/26/16 1259 02/26/16 1633 02/26/16 2133 02/27/16 0830  GLUCAP 132* 148* 356* 338* 335*   Iron Studies: No results for input(s): IRON, TIBC, TRANSFERRIN, FERRITIN in the last 72 hours. @lablastinr3 @ Studies/Results: No results found. Medications:   . allopurinol  200 mg Oral Daily  . aspirin EC  81 mg Oral QHS  . atorvastatin  80 mg Oral q1800  . calcium acetate  667 mg Oral TID WC  . doxercalciferol  3 mcg Intravenous Q M,W,F-HD  . Ferric Citrate  2 g Oral TID WC  . gabapentin  300 mg Oral QHS  . heparin  5,000 Units Subcutaneous Q8H  . insulin  aspart  0-9 Units Subcutaneous TID WC  . insulin glargine  20 Units Subcutaneous Daily  . levothyroxine  100 mcg Oral QAC breakfast  . metoCLOPramide  10 mg Oral TID PC & HS  . midodrine  20 mg Oral Q M,W,F  . multivitamin  1 tablet Oral QHS  . piperacillin-tazobactam (ZOSYN)  IV  3.375 g Intravenous Q12H  . prasugrel  10 mg Oral Daily  . rOPINIRole  2 mg Oral Q supper  . vancomycin  1,000 mg Intravenous Q Sat-HD  . [START ON 02/29/2016] vancomycin  1,000 mg Intravenous Q M,W,F-HD     Assessment/Plan: 1. Hypotension/Chills/R/O Sepsis: Per primary. Continue Vanc/Zosyn. BC NG X 1 day. WBC down 8.0  Procalcitonin 5.03 Tmax 98.8 yesterday, 98.4 currently.  2. Hyperglycemia: Off insulin drip. BS still high 335. Per primary.   3 Hyperkalemia: Resolving. K+ 5.1. On 2.0 K bath.  4. ESRD -MWF HP. On HD extra treatment for volume removal today. OLC yellow. Arterial pressure bottoming out, unable to get BFR > 300. Attempt to reposition arterial needle-see if this improves situation.   5 Anemia - HGB 7.9 today.  HGB was  12.2 on adm. 10.3. Yesterday. Rec'd ESA dose 02/24/16.  Repeat HGB 7.8. H/O GI bleed. Check FOB. Follow HGB. Discussed with primary. Concern for LGIB.   4. Secondary hyperparathyroidism - Ca 8.5 Phos 6.0  Takes Auryxia 2 tabs PO TID AC.Calcium acetate 667 mg PO TID 1 cap with snack. Will increase Ca Acetate.  5.Hypotension/volume - on HD DID receive midodrine today. BP 158/70. Pre wt 121.6 kg. UFG 4000. Cautioned fld restrictions. OP EDW lowered to 115 kg 10/27. Pt has not gotten to EDW since. May need to increase slightly.  6. Nutrition - Albumin 2.7. Phos ^ change to renal/carb mod diet. Reinforce fld restrictions.  7.H/O CAD PTCI to Dx 09/17 8. H/O Diastolic HF.   Rita H. Brown NP-C 02/27/2016, 8:49 AM  [facility] [phone]  Pt seen, examined, agree w assess/plan as above with additions as indicated. Blood cultures were negative, agree w stopping abx.  Looks much better, plan is for HD today, dispo per primary svc.   Kelly Splinter MD [facility] pager [phone]    cell [phone] 02/27/2016, 11:41 AM

## 2016-02-28 LAB — CBC
HCT: 28.2 % — ABNORMAL LOW (ref 39.0–52.0)
Hemoglobin: 8.4 g/dL — ABNORMAL LOW (ref 13.0–17.0)
MCH: 29.3 pg (ref 26.0–34.0)
MCHC: 29.8 g/dL — ABNORMAL LOW (ref 30.0–36.0)
MCV: 98.3 fL (ref 78.0–100.0)
Platelets: 307 10*3/uL (ref 150–400)
RBC: 2.87 MIL/uL — ABNORMAL LOW (ref 4.22–5.81)
RDW: 18.6 % — ABNORMAL HIGH (ref 11.5–15.5)
WBC: 6.3 10*3/uL (ref 4.0–10.5)

## 2016-02-28 LAB — BASIC METABOLIC PANEL
Anion gap: 11 (ref 5–15)
BUN: 20 mg/dL (ref 6–20)
CO2: 27 mmol/L (ref 22–32)
Calcium: 8.9 mg/dL (ref 8.9–10.3)
Chloride: 95 mmol/L — ABNORMAL LOW (ref 101–111)
Creatinine, Ser: 5.03 mg/dL — ABNORMAL HIGH (ref 0.61–1.24)
GFR calc Af Amer: 14 mL/min — ABNORMAL LOW (ref >60)
GFR calc non Af Amer: 12 mL/min — ABNORMAL LOW (ref >60)
Glucose, Bld: 342 mg/dL — ABNORMAL HIGH (ref 65–99)
Potassium: 4.1 mmol/L (ref 3.5–5.1)
Sodium: 133 mmol/L — ABNORMAL LOW (ref 135–145)

## 2016-02-28 LAB — GLUCOSE, CAPILLARY: Glucose-Capillary: 359 mg/dL — ABNORMAL HIGH (ref 65–99)

## 2016-02-28 NOTE — Progress Notes (Signed)
Reviewed discharge instructions and medications with patient and his wife per patient's request.  All questions answered.  Tele and IV removed; no issues noted.  Patient denies all complaints. Patient leaving unit via wheelchair in stable condition.

## 2016-02-28 NOTE — Progress Notes (Signed)
[facility] Progress Note   Subjective:  "I got a little sweaty last night but I don't know if I had a fever". Appears at baseline, looks better. No C/Os.   Objective Vitals:   02/27/16 1351 02/27/16 1702 02/27/16 2100 02/28/16 0540  BP: (!) 120/53 (!) 123/52 (!) 125/51 (!) 145/61  Pulse: 89 86 88 84  Resp: 19 16 17 17   Temp: 98 F (36.7 C) 97.2 F (36.2 C) 97.5 F (36.4 C) 97.8 F (36.6 C)  TempSrc: Oral Oral Oral Oral  SpO2: 100% 100% 100% 100%  Weight:   118.5 kg (261 lb 3.9 oz)   Height:       Physical Exam General: Pleasant, well nourished, NAD Heart:S1,S2, RRR Lungs: CTAB Abdomen: obese, non-tender Extremities: No edema LLE, R BKA no edema Dialysis Access: LUA AVF + bruit. Has excoriation from tape on both sides of mid portion AVF. No tape on this area.   Dialysis:MWF [facility] 4h 11min 115kg (making it to 116-117) 2/2.25 bath P4 Hep 5000 LUA AVF (worked on 11/1 by [facility]) Mircera 225 last 11/1, due 11/15 Hect 3 ug 9.5/ 3.8/ 152 Auryxia/ phoslo binders  Additional Objective Labs: Basic Metabolic Panel:  Recent Labs Lab 02/26/16 1251 02/27/16 0634 02/27/16 0835 02/28/16 0545  NA  --  129* 129* 133*  K  --  5.1 4.9 4.1  CL  --  91* 92* 95*  CO2  --  26 25 27   GLUCOSE  --  326* 320* 342*  BUN  --  37* 36* 20  CREATININE  --  6.15* 5.72* 5.03*  CALCIUM  --  8.5* 8.4* 8.9  PHOS 6.0*  --  4.8*  --    Liver Function Tests:  Recent Labs Lab 02/25/16 0735 02/26/16 0301 02/27/16 0835  AST 92* 66*  --   ALT 40 35  --   ALKPHOS 82 88  --   BILITOT 0.9 0.6  --   PROT 7.6 6.5  --   ALBUMIN 3.0* 2.7* 2.5*   No results for input(s): LIPASE, AMYLASE in the last 168 hours. CBC:  Recent Labs Lab 02/25/16 0735 02/26/16 0301 02/26/16 0839 02/26/16 1043 02/27/16 0634 02/28/16 0545  WBC 21.6* 15.7* 11.8*  --  8.0 6.3  NEUTROABS 20.0*  --   --   --   --   --   HGB 10.3* 7.6* 7.8* 8.8* 7.9* 8.4*  HCT 34.4* 25.2* 25.6*  26.0* 26.3* 28.2*  MCV 99.1 97.7 96.2  --  97.8 98.3  PLT 390 331 306  --  304 307   Blood Culture    Component Value Date/Time   SDES BLOOD RIGHT HAND 02/25/2016 0745   SPECREQUEST BOTTLES DRAWN AEROBIC AND ANAEROBIC 5CC 02/25/2016 0745   CULT NO GROWTH 2 DAYS 02/25/2016 0745   REPTSTATUS PENDING 02/25/2016 0745    Cardiac Enzymes: No results for input(s): CKTOTAL, CKMB, CKMBINDEX, TROPONINI in the last 168 hours. CBG:  Recent Labs Lab 02/27/16 0830 02/27/16 1144 02/27/16 1700 02/27/16 2100 02/28/16 0756  GLUCAP 335* 133* 229* 228* 359*   Iron Studies: No results for input(s): IRON, TIBC, TRANSFERRIN, FERRITIN in the last 72 hours. @lablastinr3 @ Studies/Results: No results found. Medications:  . allopurinol  200 mg Oral Daily  . aspirin EC  81 mg Oral QHS  . atorvastatin  80 mg Oral q1800  . calcium acetate  1,334 mg Oral TID WC  . doxercalciferol  3 mcg Intravenous Q M,W,F-HD  . gabapentin  300  mg Oral QHS  . heparin  5,000 Units Subcutaneous Q8H  . insulin aspart  0-9 Units Subcutaneous TID WC  . insulin glargine  25 Units Subcutaneous Daily  . levothyroxine  100 mcg Oral QAC breakfast  . metoCLOPramide  10 mg Oral TID PC & HS  . midodrine  20 mg Oral Q M,W,F  . multivitamin  1 tablet Oral QHS  . prasugrel  10 mg Oral Daily  . rOPINIRole  2 mg Oral Q supper  . saccharomyces boulardii  250 mg Oral BID     Assessment/Plan: 1. Hypotension/Chills/R/O Sepsis: Per primary.AFebrile.[name] Kitchen BC NG X 2 days. WBC down 6.2  Off antibiotics. DC home today per primary.  2. Hyperglycemia: Off insulin drip. BS still high 359. Per primary.   3 Hyperkalemia: Resolved. K+ 4.1 today.   4. ESRD -MWF HP. Had extra tx 02/27/16. Next Tx 02/29/16.  5Anemia - HGB ^ 8.4 today.  HGB was 12.2 on adm. 10.3. Yesterday. Rec'd ESA dose 02/24/16. Need to F/U with [facility] as OP for colonoscopy. Ordered FOB X 3-none have been done.  4. Secondary hyperparathyroidism -Ca 8.9. Continue  binders/VDRA. Takes Auryxia 2 tabs PO TID AC.Increased Ca Acetate to 2 caps po TID AC 1 cap w/snack. Phos improved-4.8 5.Hypotension/volume - Extra Tx 02/27/16 Pre wt 121.6 kg.Net UF 3473 Post wt 118.3. Cautioned fld restrictions. OP EDW lowered to 115 kg 10/27. Pt has not gotten to EDW since lowered to 115 kg 02/22/16. Raise EDW to 116.5 kg which is the lowest he has reached in-center. Continue midodrine.  6. Nutrition - Albumin 2.5 Renal/carb mod diet. Reinforce fld restrictions.  7.H/O CAD PTCI to Dx 09/17 8. H/O Diastolic HF.   Disposition: Stable for DC from nephrology standpoint. Raise EDW. Be sure pt F/U with GI.   Rita H. Brown NP-C 02/28/2016, 9:07 AM  [facility] [phone]  Pt seen, examined and agree w A/P as above. OK for dc, raise dry wt.   Kelly Splinter MD [name] pager [phone]   02/28/2016, 11:10 AM

## 2016-02-28 NOTE — Discharge Summary (Signed)
[facility]  Physician Discharge Summary   Patient ID: Dale [name] MRN: [id number] DOB/AGE: 50-Jun-1967 50 y.o.  Admit date: 02/25/2016 Discharge date: 02/28/2016  PCP: ASRES,ALEHEGN, MD  DISCHARGE DIAGNOSES:  Principal Problem:   Sepsis ([place]) Active Problems:   ESRD (end stage renal disease) ([place])   Fever and chills   HTN (hypertension)   Coronary artery disease involving native coronary artery of native heart with unstable angina pectoris ([place])   HLD (hyperlipidemia)   Hyperkalemia   Diabetes mellitus (HCC)   FUO (fever of unknown origin)   Anemia of chronic disease   Severe sepsis (HCC)   Chronic diastolic heart failure (HCC)   RECOMMENDATIONS FOR OUTPATIENT FOLLOW UP: 1. Patient has been told to follow-up with the gastroenterology as he likely needs to undergo colonoscopy in the near future   DISCHARGE CONDITION: fair  Diet recommendation: As before  [facility] Weights   02/27/16 0810 02/27/16 1241 02/27/16 2100  Weight: 121.6 kg (268 lb 1.3 oz) 118.3 kg (260 lb 12.9 oz) 118.5 kg (261 lb 3.9 oz)    INITIAL HISTORY: 50 year old Caucasian male with a past medical history of end-stage renal disease on hemodialysis, coronary artery disease, congestive heart failure, presented with complaints of fever, dizziness and generalized weakness. Apparently underwent procedure to his left arm graft recently. He did have an angioplasty and a stent placement. There was concern that patient may have become bacteremic. He was hospitalized for further management.  Consultations:  nephrology  Procedures:  Hemodialysis  HOSPITAL COURSE:   Questionable Sepsis Patient was admitted due to concern for bacteremia considering recent procedure to his AV graft. Patient had low blood pressure, which improved with midodrine. Patient was placed on vancomycin and Zosyn. Blood cultures are negative so far. He has been afebrile. WBCs normal. Sepsis was ruled out. Antibiotics were  stopped. Cultures remain negative. He has been afebrile. Okay for discharge today.   End-stage renal disease Monday Wednesday Friday dialysis Patient was seen by nephrology. He underwent extra session of dialysis on Saturday. He will be dialyzed again tomorrow as outpatient as per his usual schedule.  Hyperkalemia Corrected after dialysis.  Diabetes mellitus type II, on insulin pump Patient developed significant hyperglycemia at the time of admission due to steroids that were given to him in the ED. He was given steroids as there was initially a plan to do a CT scan, which was eventually not done. Patient was briefly placed on intravenous insulin. Blood sugars stabilize. He was taken off of IV insulin due to low blood sugars and then transitioned to Lantus. He did not not have his insulin pump in the hospital. CBGs have been elevated but stable for the most part. He may resume his insulin pump at home. HbA1c 5.7.  Anemia of chronic disease Hemoglobin is close to baseline. Nephrology was concerned about possible trend downwards over the last few months.  However, patient has not had any overt bleeding. Hemoglobin has been stable. Patient was supposed to have a colonoscopy as an outpatient. He was recently seen by Dr. Silverio Decamp with gastroenterology. He has been asked to follow-up with gastroenterology as outpatient.  History of CAD Denies chest pain. EKG without acute changes. Recent non-STEMI with positive troponin. Heart cath with DES 01/01/16. Continue home meds. Beta blocker on hold due to hypotension.  Hypotension Patient actually has chronic hypotension and is on midodrine, which is being continued. Blood pressures have improved.  History of Diastolic heart failure Echo recently revealed mild LVH with an  EF of 55% and grade 2 diastolic dysfunction. Medications include betablocker. Fluid management with hemodialysis.  Chronic pain Continue home medications  Hyponatremia Some  degree of pseudohyponatremia secondary to elevated glucose levels.   Overall improved. Okay for discharge home today.   PERTINENT LABS:  The results of significant diagnostics from this hospitalization (including imaging, microbiology, ancillary and laboratory) are listed below for reference.    Microbiology: Recent Results (from the past 240 hour(s))  Blood Culture (routine x 2)     Status: None (Preliminary result)   Collection Time: 02/25/16  7:35 AM  Result Value Ref Range Status   Specimen Description BLOOD RIGHT FOREARM  Final   Special Requests BOTTLES DRAWN AEROBIC AND ANAEROBIC 5CC  Final   Culture NO GROWTH 3 DAYS  Final   Report Status PENDING  Incomplete  Blood Culture (routine x 2)     Status: None (Preliminary result)   Collection Time: 02/25/16  7:45 AM  Result Value Ref Range Status   Specimen Description BLOOD RIGHT HAND  Final   Special Requests BOTTLES DRAWN AEROBIC AND ANAEROBIC 5CC  Final   Culture NO GROWTH 3 DAYS  Final   Report Status PENDING  Incomplete     Labs: Basic Metabolic Panel:  Recent Labs Lab 02/26/16 0301 02/26/16 0815 02/26/16 1043 02/26/16 1251 02/27/16 0634 02/27/16 0835 02/28/16 0545  NA 128* 136 135  --  129* 129* 133*  K 6.0* 4.2 3.1*  --  5.1 4.9 4.1  CL 96* 100* 93*  --  91* 92* 95*  CO2 19* 25  --   --  26 25 27   GLUCOSE 539* 69 113*  --  326* 320* 342*  BUN 78* 53* 27*  --  37* 36* 20  CREATININE 9.94* 7.03* 4.10*  --  6.15* 5.72* 5.03*  CALCIUM 8.9 9.0  --   --  8.5* 8.4* 8.9  PHOS  --   --   --  6.0*  --  4.8*  --    Liver Function Tests:  Recent Labs Lab 02/25/16 0735 02/26/16 0301 02/27/16 0835  AST 92* 66*  --   ALT 40 35  --   ALKPHOS 82 88  --   BILITOT 0.9 0.6  --   PROT 7.6 6.5  --   ALBUMIN 3.0* 2.7* 2.5*   CBC:  Recent Labs Lab 02/25/16 0735 02/26/16 0301 02/26/16 0839 02/26/16 1043 02/27/16 0634 02/28/16 0545  WBC 21.6* 15.7* 11.8*  --  8.0 6.3  NEUTROABS 20.0*  --   --   --   --   --    HGB 10.3* 7.6* 7.8* 8.8* 7.9* 8.4*  HCT 34.4* 25.2* 25.6* 26.0* 26.3* 28.2*  MCV 99.1 97.7 96.2  --  97.8 98.3  PLT 390 331 306  --  304 307    CBG:  Recent Labs Lab 02/27/16 0830 02/27/16 1144 02/27/16 1700 02/27/16 2100 02/28/16 0756  GLUCAP 335* 133* 229* 228* 359*     IMAGING STUDIES  Dg Chest Port 1 View  Result Date: 02/25/2016 CLINICAL DATA:  50 year old male with chest pain. Subsequent encounter. EXAM: PORTABLE CHEST 1 VIEW COMPARISON:  02/06/2016 chest x-ray.  01/28/2016 chest CT. FINDINGS: Cardiomegaly. Pulmonary vascular prominence most notable centrally. No segmental consolidation or pneumothorax. Left subclavian stent in place. Limited evaluation of mediastinal structures. IMPRESSION: Cardiomegaly. Pulmonary vascular prominence most notable centrally. Electronically Signed   By: Genia Del M.D.   On: 02/25/2016 07:57    DISCHARGE EXAMINATION: Vitals:  02/27/16 1702 02/27/16 2100 02/28/16 0540 02/28/16 0954  BP: (!) 123/52 (!) 125/51 (!) 145/61 (!) 131/56  Pulse: 86 88 84 83  Resp: 16 17 17 19   Temp: 97.2 F (36.2 C) 97.5 F (36.4 C) 97.8 F (36.6 C) 97.8 F (36.6 C)  TempSrc: Oral Oral Oral Oral  SpO2: 100% 100% 100% 100%  Weight:  118.5 kg (261 lb 3.9 oz)    Height:       General appearance: alert, cooperative, appears stated age and no distress Resp: clear to auscultation bilaterally Cardio: regular rate and rhythm, S1, S2 normal, no murmur, click, rub or gallop GI: soft, non-tender; bowel sounds normal; no masses,  no organomegaly Neurologic: No focal deficit  DISPOSITION: Home with family  Discharge Instructions    Call MD for:  difficulty breathing, headache or visual disturbances    Complete by:  As directed    Call MD for:  extreme fatigue    Complete by:  As directed    Call MD for:  persistant dizziness or light-headedness    Complete by:  As directed    Call MD for:  persistant nausea and vomiting    Complete by:  As directed     Call MD for:  severe uncontrolled pain    Complete by:  As directed    Call MD for:  temperature >100.4    Complete by:  As directed    Diet Carb Modified    Complete by:  As directed    Discharge instructions    Complete by:  As directed    Go for dialysis per usual schedule. Seek attention if you develop fever or chills again. Be sure to follow up with the gastroenterologist for a colonoscopy. Use your insulin pump as instructed by your CPC/Endocrinologist.  You were cared for by a hospitalist during your hospital stay. If you have any questions about your discharge medications or the care you received while you were in the hospital after you are discharged, you can call the unit and asked to speak with the hospitalist on call if the hospitalist that took care of you is not available. Once you are discharged, your primary care physician will handle any further medical issues. Please note that NO REFILLS for any discharge medications will be authorized once you are discharged, as it is imperative that you return to your primary care physician (or establish a relationship with a primary care physician if you do not have one) for your aftercare needs so that they can reassess your need for medications and monitor your lab values. If you do not have a primary care physician, you can call [phone] for a physician referral.   Increase activity slowly    Complete by:  As directed       ALLERGIES:  Allergies  Allergen Reactions  . Contrast Media [Iodinated Diagnostic Agents] Rash and Itching    Systemic itching and transient rash appearance within hours of receiving contrast  Systemic itching and transient rash appearance within hours of receiving contrast  Redman Syndrome  . Rocephin [Ceftriaxone] Itching  . Amoxicillin Itching and Rash    .Has patient had a PCN reaction causing immediate rash, facial/tongue/throat swelling, SOB or lightheadedness with hypotension: Yes Has patient had a PCN  reaction causing severe rash involving mucus membranes or skin necrosis: No Has patient had a PCN reaction that required hospitalization No Has patient had a PCN reaction occurring within the last 10 years: No If all of the  above answers are "NO", then may proceed with Cephalosporin use.  Kary Kos [Ticagrelor] Nausea And Vomiting  . Buprenorphine Hcl Nausea Only and Hives  . Clopidogrel Rash  . Hydrocodone Rash  . Morphine Nausea Only, Rash and Hives  . Adhesive [Tape] Other (See Comments) and Itching    Blisters on arm from adhesive tape at dialysis Blisters on arm from adhesive tape at dialysis  . Prednisone Other (See Comments)    Increases sugar levels  . Ciprofloxacin Itching and Rash  . Clindamycin/Lincomycin Hives and Itching  . Codeine Hives, Itching and Rash  . Doxycycline Rash  . Enalapril Rash    cough  . Levofloxacin Hives and Itching  . Mobic [Meloxicam] Hives and Rash  . Oxycodone Rash  . Vasotec Cough    Discharge Medication List as of 02/28/2016 12:16 PM    CONTINUE these medications which have NOT CHANGED   Details  albuterol (PROVENTIL HFA;VENTOLIN HFA) 108 (90 Base) MCG/ACT inhaler Inhale 2 puffs into the lungs every 6 (six) hours as needed for wheezing or shortness of breath., Starting Tue 01/26/2016, Normal    allopurinol (ZYLOPRIM) 100 MG tablet Take 200 mg by mouth daily. , Starting Wed 03/14/2012, Historical Med    aspirin EC 81 MG tablet Take 81 mg by mouth at bedtime., Historical Med    atorvastatin (LIPITOR) 80 MG tablet Take 1 tablet (80 mg total) by mouth daily at 6 PM., Starting Mon 01/04/2016, Normal    calcium acetate (PHOSLO) 667 MG capsule Take 667 mg by mouth See admin instructions. Take 1 capsule  by mouth with meals and 1 with snacks, Starting Wed 09/19/2012, Historical Med    diphenhydrAMINE (BENADRYL) 25 mg capsule Take 1-2 capsules (25-50 mg total) by mouth every 6 (six) hours as needed for itching., Starting Fri 04/11/2014, OTC      docusate sodium (COLACE) 100 MG capsule Take 100 mg by mouth 3 (three) times daily., Historical Med    fenofibrate (TRICOR) 48 MG tablet Take 1 tablet (48 mg total) by mouth daily., Starting Fri 02/27/2015, Normal    Ferric Citrate (AURYXIA) 1 GM 210 MG(Fe) TABS Take 2 g by mouth 3 (three) times daily with meals., Historical Med    fluticasone (FLONASE) 50 MCG/ACT nasal spray Place 1 spray into both nostrils daily as needed for allergies. , Historical Med    gabapentin (NEURONTIN) 300 MG capsule Take 300 mg by mouth 3 (three) times daily., Historical Med    GLUCAGON EMERGENCY 1 MG injection Inject 1 mg into the muscle daily as needed (low blood sugar). , Starting Thu 01/19/2012, Historical Med    HYDROmorphone (DILAUDID) 2 MG tablet Take 1 tablet (2 mg total) by mouth 3 (three) times daily as needed for severe pain., Starting Thu 11/20/2014, Print    Insulin Human (INSULIN PUMP) 100 unit/ml SOLN Inject 1 each into the skin continuous. Humalog, Historical Med    insulin lispro (HUMALOG) 100 UNIT/ML injection Inject 1-175 Units into the skin continuous. Up to 175 units continuous in pump,  Basal rates area as follows:  2400 1.5 units/hr, 0300 1.9 units/hr, 0800 1.45 units/hr, 1200 1.625 units/hr, Historical Med    levothyroxine (SYNTHROID, LEVOTHROID) 100 MCG tablet Take 100 mcg by mouth at bedtime. , Historical Med    loratadine (CLARITIN) 10 MG tablet Take 10 mg by mouth daily., Historical Med    metoCLOPramide (REGLAN) 5 MG tablet Take 2 tablets (10 mg total) by mouth 4 (four) times daily - after meals and at  bedtime., Starting Thu 01/21/2016, Normal    metoprolol tartrate (LOPRESSOR) 25 MG tablet Take 1 tablet (25 mg total) by mouth 2 (two) times daily., Starting Thu 01/28/2016, Normal    midodrine (PROAMATINE) 10 MG tablet Take 20 mg by mouth every Monday, Wednesday, and Friday. On dialysis days, Historical Med    multivitamin (RENA-VIT) TABS tablet Take 1 tablet by mouth daily.,  Historical Med    nitroGLYCERIN (NITROSTAT) 0.4 MG SL tablet Place 1 tablet (0.4 mg total) under the tongue every 5 (five) minutes as needed for chest pain., Starting Mon 02/15/2016, Normal    Nutritional Supplements (FEEDING SUPPLEMENT, NEPRO CARB STEADY,) LIQD Take 237 mLs by mouth daily., Starting Mon 01/04/2016, Normal    OXYGEN Inhale 3 L into the lungs at bedtime., Historical Med    pantoprazole (PROTONIX) 40 MG tablet Take 1 tablet (40 mg total) by mouth 2 (two) times daily., Starting Sat 02/06/2016, Print    Polyethyl Glycol-Propyl Glycol (SYSTANE) 0.4-0.3 % SOLN Place 1 drop into both eyes as needed (dry eyes)., Historical Med    polyethylene glycol (MIRALAX / GLYCOLAX) packet Take 17 g by mouth daily., Starting Sat 02/06/2016, Print    prasugrel (EFFIENT) 10 MG TABS tablet Take 1 tablet (10 mg total) by mouth daily., Starting Thu 01/28/2016, Until Wed 04/27/2016, Normal    promethazine (PHENERGAN) 25 MG tablet Take 1 tablet (25 mg total) by mouth every 6 (six) hours as needed for nausea or vomiting. For nausea, Starting Thu 11/27/2014, Normal    rOPINIRole (REQUIP) 2 MG tablet Take 2 mg by mouth daily with supper. , Historical Med      STOP taking these medications     vancomycin (VANCOCIN) 1-5 GM/200ML-% SOLN          Follow-up Information    Harl Bowie, MD. Schedule an appointment as soon as possible for a visit.   Specialty:  Gastroenterology Why:  for outpatient colonoscopy Contact information: [place] [phone] [phone]           TOTAL DISCHARGE TIME: 36 minutes  [facility]  [facility] Pager [phone]  02/28/2016, 1:09 PM

## 2016-03-01 LAB — CULTURE, BLOOD (ROUTINE X 2)
Culture: NO GROWTH
Culture: NO GROWTH

## 2016-03-02 ENCOUNTER — Ambulatory Visit (HOSPITAL_BASED_OUTPATIENT_CLINIC_OR_DEPARTMENT_OTHER): Payer: Medicare Other | Attending: Adult Health | Admitting: Pulmonary Disease

## 2016-03-02 DIAGNOSIS — J40 Bronchitis, not specified as acute or chronic: Secondary | ICD-10-CM

## 2016-03-02 DIAGNOSIS — G4733 Obstructive sleep apnea (adult) (pediatric): Secondary | ICD-10-CM | POA: Insufficient documentation

## 2016-03-02 DIAGNOSIS — G4761 Periodic limb movement disorder: Secondary | ICD-10-CM | POA: Insufficient documentation

## 2016-03-02 DIAGNOSIS — G4734 Idiopathic sleep related nonobstructive alveolar hypoventilation: Secondary | ICD-10-CM | POA: Diagnosis not present

## 2016-03-02 DIAGNOSIS — J398 Other specified diseases of upper respiratory tract: Secondary | ICD-10-CM | POA: Insufficient documentation

## 2016-03-03 ENCOUNTER — Encounter: Payer: Self-pay | Admitting: Adult Health

## 2016-03-03 ENCOUNTER — Telehealth: Payer: Self-pay | Admitting: Adult Health

## 2016-03-03 DIAGNOSIS — J40 Bronchitis, not specified as acute or chronic: Secondary | ICD-10-CM | POA: Diagnosis not present

## 2016-03-03 DIAGNOSIS — G4734 Idiopathic sleep related nonobstructive alveolar hypoventilation: Secondary | ICD-10-CM | POA: Insufficient documentation

## 2016-03-03 DIAGNOSIS — G4733 Obstructive sleep apnea (adult) (pediatric): Secondary | ICD-10-CM

## 2016-03-03 NOTE — Telephone Encounter (Signed)
Sleep study shows : Sleep study 03/02/2016 >Severe OSA (This study showed severe obstructive sleep apnea with an AH of 76 and SaO2 low of 56%. -  >He should be started on trial of BiPAP therapy on 23/19 cm H2O. - He was fitted with a Small size [employer] Full Face Mask Simplus mask and heated humidification. Needs ov in 1 month after starting with download with sleep doc or myself

## 2016-03-03 NOTE — Telephone Encounter (Signed)
Called and spoke to pt. Informed him of the results and recs per TP and appt made with TP on 04/14/16. Pt verbalized understanding and denied any further questions or concerns at this time.   Order has not been placed because a pressure support value was not mentioned. Called Melissa with [facility] to see if it is needed. LMTCB for Melissa. Will hold message in triage to follow up on.

## 2016-03-03 NOTE — Procedures (Signed)
   Patient Name: Dale [name], Dale [name] Date: 03/02/2016 Gender: Male D.O.B: [birth year] Age (years): 17 Referring Provider: Tammy Parrett Height (inches): 69 Interpreting Physician: Chesley Mires MD, ABSM Weight (lbs): 255 RPSGT: Carolin Coy BMI: 38 MRN: [id number] Neck Size: 19.00  CLINICAL INFORMATION The patient is referred for a split night study with BPAP. MEDICATIONS Medications self-administered by patient taken the night of the study : ASPIRIN, LIPITOR, BENADRYL, COLACE, NEURONTIN, DILAUDID, SYNTHROID, REGLAN  SLEEP STUDY TECHNIQUE As per the AASM Manual for the Scoring of Sleep and Associated Events v2.3 (April 2016) with a hypopnea requiring 4% desaturations. The channels recorded and monitored were frontal, central and occipital EEG, electrooculogram (EOG), submentalis EMG (chin), nasal and oral airflow, thoracic and abdominal wall motion, anterior tibialis EMG, snore microphone, electrocardiogram, and pulse oximetry. Bi-level positive airway pressure (BiPAP) was initiated when the patient met split night criteria and was titrated according to treat sleep-disordered breathing.  RESPIRATORY PARAMETERS Diagnostic Total AHI (/hr): 76.0 RDI (/hr): 78.4 OA Index (/hr): 0.9 CA Index (/hr): 0.0 REM AHI (/hr): 44.7 NREM AHI (/hr): 79.8 Supine AHI (/hr): 148.3 Non-supine AHI (/hr): 70.82 Min O2 Sat (%): 56.00 Mean O2 (%): 91.40 Time below 88% (min): 20.1   Titration Optimal IPAP Pressure (cm): 23 Optimal EPAP Pressure (cm): 19 AHI at Optimal Pressure (/hr): 0.0 Min O2 at Optimal Pressure (%): 92.0 Sleep % at Optimal (%): 99 Supine % at Optimal (%): 0     SLEEP ARCHITECTURE The study was initiated at 9:35:06 PM and terminated at 4:34:36 AM. The total recorded time was 419.5 minutes. EEG confirmed total sleep time was 402.0 minutes yielding a sleep efficiency of 95.8%. Sleep onset after lights out was 0.9 minutes with a REM latency of 90.5 minutes. The patient spent 7.59% of the night  in stage N1 sleep, 73.76% in stage N2 sleep, 0.37% in stage N3 and 18.28% in REM. Wake after sleep onset (WASO) was 16.6 minutes. The Arousal Index was 10.6/hour.  LEG MOVEMENT DATA The total Periodic Limb Movements of Sleep (PLMS) were 179. The PLMS index was 26.72 .  CARDIAC DATA The 2 lead EKG demonstrated sinus rhythm. The mean heart rate was 93.80 beats per minute. Other EKG findings include: None.  IMPRESSIONS - This study showed severe obstructive sleep apnea with an AH of 76 and SaO2 low of 56%. - This was a difficult titration portion of the study.  He continued to have respiratory events even with high CPAP settings.  He was transitioned to Bipap, and had reasonable control of his sleep apnea with Bipap 23/19 cm H2O.  He was observed in REM sleep at this pressure setting.  He did not require supplemental oxygen once he was on this Bipap setting. - He had a increase in his periodic limb movement index.  DIAGNOSIS - Obstructive Sleep Apnea (327.23 [G47.33 ICD-10]) - Sleep Related Hypoxia (327.24 [G47.34 ICD-10]) - Tracheobronchomalacia (519.19 [J39.8 ICD-10]) - Periodic Limb Movements of Sleep (327.51 [G47.61 ICD-10])  RECOMMENDATIONS - He should be started on trial of BiPAP therapy on 23/19 cm H2O. - He was fitted with a Small size [employer] Full Face Mask Simplus mask and heated humidification. - He should be assessed for the presence of restless leg syndrome.  [Electronically signed] 03/03/2016 08:32 AM  Chesley Mires MD, ABSM Diplomate, American Board of Sleep Medicine   NPI: [id number]

## 2016-03-04 DIAGNOSIS — L509 Urticaria, unspecified: Secondary | ICD-10-CM | POA: Diagnosis not present

## 2016-03-04 NOTE — Telephone Encounter (Signed)
Dale [name] [employer]) returned phone call.Dale [name]

## 2016-03-04 NOTE — Telephone Encounter (Signed)
Called spoke with Stanton Kidney w/ [facility] Pressure support isn't needed for a set pressure Order completed and signed Nothing further needed; will sign off

## 2016-03-04 NOTE — Telephone Encounter (Signed)
Called and spoke to [facility] with [facility] and was advised she believes he will need pressure support number but will check into the pt's chart and call back with everything that is needed. Will hold in triage till this has been completed.

## 2016-03-07 DIAGNOSIS — D631 Anemia in chronic kidney disease: Secondary | ICD-10-CM | POA: Diagnosis not present

## 2016-03-07 DIAGNOSIS — M109 Gout, unspecified: Secondary | ICD-10-CM | POA: Diagnosis not present

## 2016-03-07 DIAGNOSIS — E1022 Type 1 diabetes mellitus with diabetic chronic kidney disease: Secondary | ICD-10-CM | POA: Diagnosis not present

## 2016-03-07 DIAGNOSIS — I2510 Atherosclerotic heart disease of native coronary artery without angina pectoris: Secondary | ICD-10-CM | POA: Diagnosis not present

## 2016-03-07 DIAGNOSIS — M797 Fibromyalgia: Secondary | ICD-10-CM | POA: Diagnosis not present

## 2016-03-07 DIAGNOSIS — I132 Hypertensive heart and chronic kidney disease with heart failure and with stage 5 chronic kidney disease, or end stage renal disease: Secondary | ICD-10-CM | POA: Diagnosis not present

## 2016-03-07 DIAGNOSIS — N186 End stage renal disease: Secondary | ICD-10-CM | POA: Diagnosis not present

## 2016-03-07 DIAGNOSIS — K219 Gastro-esophageal reflux disease without esophagitis: Secondary | ICD-10-CM | POA: Diagnosis not present

## 2016-03-07 DIAGNOSIS — E10649 Type 1 diabetes mellitus with hypoglycemia without coma: Secondary | ICD-10-CM | POA: Diagnosis not present

## 2016-03-07 DIAGNOSIS — I5033 Acute on chronic diastolic (congestive) heart failure: Secondary | ICD-10-CM | POA: Diagnosis not present

## 2016-03-14 DIAGNOSIS — M25561 Pain in right knee: Secondary | ICD-10-CM | POA: Diagnosis not present

## 2016-03-14 DIAGNOSIS — Z89512 Acquired absence of left leg below knee: Secondary | ICD-10-CM | POA: Diagnosis not present

## 2016-03-14 DIAGNOSIS — M1711 Unilateral primary osteoarthritis, right knee: Secondary | ICD-10-CM | POA: Diagnosis not present

## 2016-03-18 DIAGNOSIS — I5032 Chronic diastolic (congestive) heart failure: Secondary | ICD-10-CM | POA: Diagnosis not present

## 2016-03-18 DIAGNOSIS — R0602 Shortness of breath: Secondary | ICD-10-CM | POA: Diagnosis not present

## 2016-03-18 DIAGNOSIS — I509 Heart failure, unspecified: Secondary | ICD-10-CM | POA: Diagnosis not present

## 2016-03-18 DIAGNOSIS — G4733 Obstructive sleep apnea (adult) (pediatric): Secondary | ICD-10-CM | POA: Diagnosis not present

## 2016-03-18 DIAGNOSIS — Z89519 Acquired absence of unspecified leg below knee: Secondary | ICD-10-CM | POA: Diagnosis not present

## 2016-03-20 DIAGNOSIS — L270 Generalized skin eruption due to drugs and medicaments taken internally: Secondary | ICD-10-CM | POA: Diagnosis not present

## 2016-03-24 ENCOUNTER — Encounter: Payer: Self-pay | Admitting: Gastroenterology

## 2016-03-24 ENCOUNTER — Ambulatory Visit (INDEPENDENT_AMBULATORY_CARE_PROVIDER_SITE_OTHER): Payer: Medicare Other | Admitting: Gastroenterology

## 2016-03-24 ENCOUNTER — Telehealth: Payer: Self-pay | Admitting: Emergency Medicine

## 2016-03-24 VITALS — BP 110/60 | HR 80

## 2016-03-24 DIAGNOSIS — Z794 Long term (current) use of insulin: Secondary | ICD-10-CM

## 2016-03-24 DIAGNOSIS — D649 Anemia, unspecified: Secondary | ICD-10-CM

## 2016-03-24 DIAGNOSIS — E119 Type 2 diabetes mellitus without complications: Secondary | ICD-10-CM | POA: Diagnosis not present

## 2016-03-24 DIAGNOSIS — N186 End stage renal disease: Secondary | ICD-10-CM

## 2016-03-24 DIAGNOSIS — Z992 Dependence on renal dialysis: Secondary | ICD-10-CM

## 2016-03-24 DIAGNOSIS — K625 Hemorrhage of anus and rectum: Secondary | ICD-10-CM | POA: Insufficient documentation

## 2016-03-24 DIAGNOSIS — I25110 Atherosclerotic heart disease of native coronary artery with unstable angina pectoris: Secondary | ICD-10-CM

## 2016-03-24 DIAGNOSIS — Z0000 Encounter for general adult medical examination without abnormal findings: Secondary | ICD-10-CM | POA: Insufficient documentation

## 2016-03-24 DIAGNOSIS — Z9981 Dependence on supplemental oxygen: Secondary | ICD-10-CM

## 2016-03-24 DIAGNOSIS — Z7902 Long term (current) use of antithrombotics/antiplatelets: Secondary | ICD-10-CM

## 2016-03-24 DIAGNOSIS — IMO0001 Reserved for inherently not codable concepts without codable children: Secondary | ICD-10-CM | POA: Insufficient documentation

## 2016-03-24 DIAGNOSIS — T8612 Kidney transplant failure: Secondary | ICD-10-CM | POA: Diagnosis not present

## 2016-03-24 MED ORDER — NA SULFATE-K SULFATE-MG SULF 17.5-3.13-1.6 GM/177ML PO SOLN: 1.0000 | ORAL | 0 refills | Status: DC

## 2016-03-24 MED ORDER — PANTOPRAZOLE SODIUM 40 MG PO TBEC: 40.0000 mg | DELAYED_RELEASE_TABLET | Freq: Two times a day (BID) | ORAL | 5 refills | Status: DC

## 2016-03-24 NOTE — Progress Notes (Addendum)
     03/24/2016 Dale [name] [id number] 01/25/1966   History of Present Illness:  50 year old male with a very complicated medical history including end-stage renal disease with previous renal transplant that has also now failed and he is back on dialysis for the past few years on MWF, coronary artery disease with drug-eluting stents placed in September 2017 requiring Effient, insulin-dependent diabetes mellitus, oxygen dependent at nighttime, issues with previous difficult intubations, and left BKA with prosthesis.  Sent here by nephrology due to concerns that they are having a hard time getting his hemoglobin to increase to baseline.  According to recent iron studies this appears to be an anemia of chronic disease, but is on auryxia (ferric citrate) 2 grams TID with meals.  He is on was admitted to the hospital in September at which time he underwent an EGD for evaluation of upper GI bleeding. Was found to have some gastropathy in his stomach that was bleeding with contact, but no one specific high risk bleeding lesion identified. Has remained on pantoprazole 40 mg twice a day since that time. That was performed by Dr. Silverio Decamp.  His last colonoscopy was in May 2013 at which time he was found to have some changes suspicious for ischemia related to his dialysis.  He admits to some mild rectal bleeding that he has seen on occasion when he is constipated and straining.   Current Medications, Allergies, Past Medical History, Past Surgical History, Family History and Social History were reviewed in [employer] record.   Physical Exam: BP 110/60   Pulse 80  General: Well developed white male in no acute distress Head: Normocephalic and atraumatic Eyes:  Sclerae anicteric, conjunctiva pink  Ears: Normal auditory acuity Lungs: Clear throughout to auscultation Heart: Regular rate and rhythm Abdomen: Soft, obese, non-distended.  BS present.  Non-tender. Rectal:  Will be  done at the time of colonoscopy. Musculoskeletal:  Left BKA with prosthesis. Extremities: No edema  Neurological: Alert oriented x 4, grossly non-focal Psychological:  Alert and cooperative. Normal mood and affect  Assessment and Recommendations: -50 year old male with a very complicated medical history including end-stage renal disease with previous renal transplant that has also now failed and he is back on dialysis for the past few years, coronary artery disease with drug-eluting stents placed in September 2017 requiring Effient, insulin-dependent diabetes mellitus, oxygen dependent at nighttime, issues with previous difficult intubations, and left BKA with prosthesis. -Anemia:  Likely anemia of chronic (renal) disease by recent iron studies, although he is on ferric citrate.  Apparently nephrology concerned because they are having a hard time getting his hemoglobin to increase to baseline.  Will evaluate with both EGD (to re-evaluate findings on EGD in 12/2015) and colonoscopy.  These will be done at [place] with MAC sedation while he is on Effient. -UGIB:  Had some bleeding/oozing gastropathy on EGD in September 2017. Has been on twice a day PPI since that time. -Rectal bleeding:  Associated with hard stools/constipation.   -IDDM:  Insulin will be adjusted prior to endoscopic procedure per protocol. Will resume normal dosing after procedure. -O2 dependent at night -CAD with DES placed in 12/2015 on Effient -ESRD with failed transplant, now back on dialysis MWF -BKA on left  Addendum: Reviewed and agree with management. Jerene Bears, MD

## 2016-03-24 NOTE — Patient Instructions (Addendum)
You have been scheduled for a colonoscopy. Please follow written instructions given to you at your visit today.  Please pick up your prep supplies at the pharmacy within the next 1-3 days. If you use inhalers (even only as needed), please bring them with you on the day of your procedure.   

## 2016-03-24 NOTE — Telephone Encounter (Signed)
Harshal Sirmon [birth year] [id number]  Dear Dr. :Starleen Blue,     Alonza Bogus, PA-C has scheduled the above patient for a colonoscopy and endoscopy at [facility] on 04/26/16.  Our records show that he/she is on insulin therapy via an insulin pump.  Our colonoscopy prep protocol requires that:  the patient must be on a clear liquid diet the entire day prior to the procedure date as well as the morning of the procedure  the patient must be NPO after midnight prior to the procedure   the patient must consume a PEG 3350 solution to prepare for the procedure.  Please advise [place] of any adjustments that need to be made to the patient's insulin pump therapy prior to the above procedure date.    Please route or fax back this completed form to me at [phone] .  If you have any question, please call me at [phone].  Thank you for your help with this matter.  Sincerely,  Tinnie Gens, [place]

## 2016-03-30 NOTE — Telephone Encounter (Signed)
Received fax back from Dr. Kendrick Ranch office stating Patient will reduce basal rates by 50% while not eating/during prep phase. Resume normal settings when eating after colonoscopy.    Left message for patient to call office.

## 2016-03-30 NOTE — Telephone Encounter (Signed)
Pt said he was returning your call

## 2016-03-30 NOTE — Telephone Encounter (Signed)
Patient informed and verbalized understanding

## 2016-03-31 ENCOUNTER — Ambulatory Visit: Payer: Medicare Other | Admitting: Internal Medicine

## 2016-03-31 ENCOUNTER — Ambulatory Visit: Payer: Medicare Other | Admitting: Adult Health

## 2016-04-07 DIAGNOSIS — I83019 Varicose veins of right lower extremity with ulcer of unspecified site: Secondary | ICD-10-CM | POA: Diagnosis not present

## 2016-04-11 DIAGNOSIS — M1711 Unilateral primary osteoarthritis, right knee: Secondary | ICD-10-CM | POA: Diagnosis not present

## 2016-04-11 DIAGNOSIS — G894 Chronic pain syndrome: Secondary | ICD-10-CM | POA: Diagnosis not present

## 2016-04-12 ENCOUNTER — Telehealth: Payer: Self-pay | Admitting: *Deleted

## 2016-04-12 NOTE — Telephone Encounter (Signed)
Received call from [facility] at [facility].  Pt was seen in pain management clinic yesterday by Dr. Sherilyn Cooter.  They would like to schedule pt for right knee RFA (radiofrequency ablation).  Would need to hold Effient and ASA prior to procedure.  [name] is asking the soonest pt would be able to stop these medications.  At this point the earliest they can schedule procedure is March or April.   Call back number for [name] is [phone].   I told Melissa pt is seeing Richardson Dopp, PA on 05/02/16 and could address at that office visit.

## 2016-04-13 ENCOUNTER — Observation Stay (HOSPITAL_COMMUNITY)
Admission: EM | Admit: 2016-04-13 | Discharge: 2016-04-14 | Disposition: A | Discharge status: Home / Self Care | Payer: Medicare Other | Attending: Internal Medicine | Admitting: Internal Medicine

## 2016-04-13 ENCOUNTER — Emergency Department (HOSPITAL_COMMUNITY): Payer: Medicare Other

## 2016-04-13 ENCOUNTER — Encounter (HOSPITAL_COMMUNITY): Payer: Self-pay | Admitting: Emergency Medicine

## 2016-04-13 DIAGNOSIS — Z7982 Long term (current) use of aspirin: Secondary | ICD-10-CM | POA: Diagnosis not present

## 2016-04-13 DIAGNOSIS — I5032 Chronic diastolic (congestive) heart failure: Secondary | ICD-10-CM | POA: Insufficient documentation

## 2016-04-13 DIAGNOSIS — E8889 Other specified metabolic disorders: Secondary | ICD-10-CM | POA: Insufficient documentation

## 2016-04-13 DIAGNOSIS — I252 Old myocardial infarction: Secondary | ICD-10-CM | POA: Insufficient documentation

## 2016-04-13 DIAGNOSIS — Z89512 Acquired absence of left leg below knee: Secondary | ICD-10-CM | POA: Diagnosis not present

## 2016-04-13 DIAGNOSIS — D631 Anemia in chronic kidney disease: Secondary | ICD-10-CM | POA: Diagnosis not present

## 2016-04-13 DIAGNOSIS — G8929 Other chronic pain: Secondary | ICD-10-CM | POA: Insufficient documentation

## 2016-04-13 DIAGNOSIS — Z794 Long term (current) use of insulin: Secondary | ICD-10-CM | POA: Insufficient documentation

## 2016-04-13 DIAGNOSIS — E11649 Type 2 diabetes mellitus with hypoglycemia without coma: Secondary | ICD-10-CM

## 2016-04-13 DIAGNOSIS — I959 Hypotension, unspecified: Secondary | ICD-10-CM | POA: Diagnosis not present

## 2016-04-13 DIAGNOSIS — Z23 Encounter for immunization: Secondary | ICD-10-CM | POA: Diagnosis not present

## 2016-04-13 DIAGNOSIS — Z9981 Dependence on supplemental oxygen: Secondary | ICD-10-CM

## 2016-04-13 DIAGNOSIS — E1143 Type 2 diabetes mellitus with diabetic autonomic (poly)neuropathy: Secondary | ICD-10-CM | POA: Insufficient documentation

## 2016-04-13 DIAGNOSIS — K3184 Gastroparesis: Secondary | ICD-10-CM | POA: Insufficient documentation

## 2016-04-13 DIAGNOSIS — I953 Hypotension of hemodialysis: Principal | ICD-10-CM | POA: Insufficient documentation

## 2016-04-13 DIAGNOSIS — Z940 Kidney transplant status: Secondary | ICD-10-CM | POA: Insufficient documentation

## 2016-04-13 DIAGNOSIS — I2510 Atherosclerotic heart disease of native coronary artery without angina pectoris: Secondary | ICD-10-CM | POA: Diagnosis not present

## 2016-04-13 DIAGNOSIS — M109 Gout, unspecified: Secondary | ICD-10-CM | POA: Insufficient documentation

## 2016-04-13 DIAGNOSIS — Z9641 Presence of insulin pump (external) (internal): Secondary | ICD-10-CM | POA: Diagnosis not present

## 2016-04-13 DIAGNOSIS — G894 Chronic pain syndrome: Secondary | ICD-10-CM

## 2016-04-13 DIAGNOSIS — E1122 Type 2 diabetes mellitus with diabetic chronic kidney disease: Secondary | ICD-10-CM | POA: Diagnosis not present

## 2016-04-13 DIAGNOSIS — K219 Gastro-esophageal reflux disease without esophagitis: Secondary | ICD-10-CM | POA: Diagnosis not present

## 2016-04-13 DIAGNOSIS — Z89421 Acquired absence of other right toe(s): Secondary | ICD-10-CM | POA: Insufficient documentation

## 2016-04-13 DIAGNOSIS — K5900 Constipation, unspecified: Secondary | ICD-10-CM | POA: Insufficient documentation

## 2016-04-13 DIAGNOSIS — E785 Hyperlipidemia, unspecified: Secondary | ICD-10-CM | POA: Diagnosis not present

## 2016-04-13 DIAGNOSIS — G4733 Obstructive sleep apnea (adult) (pediatric): Secondary | ICD-10-CM | POA: Diagnosis not present

## 2016-04-13 DIAGNOSIS — I132 Hypertensive heart and chronic kidney disease with heart failure and with stage 5 chronic kidney disease, or end stage renal disease: Secondary | ICD-10-CM | POA: Diagnosis not present

## 2016-04-13 DIAGNOSIS — Z955 Presence of coronary angioplasty implant and graft: Secondary | ICD-10-CM | POA: Diagnosis not present

## 2016-04-13 DIAGNOSIS — Z992 Dependence on renal dialysis: Secondary | ICD-10-CM | POA: Diagnosis not present

## 2016-04-13 DIAGNOSIS — N186 End stage renal disease: Secondary | ICD-10-CM | POA: Diagnosis not present

## 2016-04-13 DIAGNOSIS — I25110 Atherosclerotic heart disease of native coronary artery with unstable angina pectoris: Secondary | ICD-10-CM

## 2016-04-13 DIAGNOSIS — Z79899 Other long term (current) drug therapy: Secondary | ICD-10-CM | POA: Insufficient documentation

## 2016-04-13 DIAGNOSIS — R0902 Hypoxemia: Secondary | ICD-10-CM | POA: Diagnosis not present

## 2016-04-13 DIAGNOSIS — E119 Type 2 diabetes mellitus without complications: Secondary | ICD-10-CM

## 2016-04-13 DIAGNOSIS — G4734 Idiopathic sleep related nonobstructive alveolar hypoventilation: Secondary | ICD-10-CM | POA: Diagnosis present

## 2016-04-13 DIAGNOSIS — R079 Chest pain, unspecified: Secondary | ICD-10-CM | POA: Diagnosis present

## 2016-04-13 DIAGNOSIS — E039 Hypothyroidism, unspecified: Secondary | ICD-10-CM | POA: Insufficient documentation

## 2016-04-13 DIAGNOSIS — R031 Nonspecific low blood-pressure reading: Secondary | ICD-10-CM | POA: Diagnosis not present

## 2016-04-13 LAB — CBC WITH DIFFERENTIAL/PLATELET
Basophils Absolute: 0.1 10*3/uL (ref 0.0–0.1)
Basophils Relative: 1 %
Eosinophils Absolute: 0.3 10*3/uL (ref 0.0–0.7)
Eosinophils Relative: 4 %
HCT: 39.7 % (ref 39.0–52.0)
Hemoglobin: 11.6 g/dL — ABNORMAL LOW (ref 13.0–17.0)
Lymphocytes Relative: 29 %
Lymphs Abs: 2.1 10*3/uL (ref 0.7–4.0)
MCH: 29.4 pg (ref 26.0–34.0)
MCHC: 29.2 g/dL — ABNORMAL LOW (ref 30.0–36.0)
MCV: 100.8 fL — ABNORMAL HIGH (ref 78.0–100.0)
Monocytes Absolute: 0.5 10*3/uL (ref 0.1–1.0)
Monocytes Relative: 7 %
Neutro Abs: 4.4 10*3/uL (ref 1.7–7.7)
Neutrophils Relative %: 59 %
Platelets: 272 10*3/uL (ref 150–400)
RBC: 3.94 MIL/uL — ABNORMAL LOW (ref 4.22–5.81)
RDW: 17.3 % — ABNORMAL HIGH (ref 11.5–15.5)
WBC: 7.4 10*3/uL (ref 4.0–10.5)

## 2016-04-13 LAB — BASIC METABOLIC PANEL
Anion gap: 14 (ref 5–15)
BUN: 22 mg/dL — ABNORMAL HIGH (ref 6–20)
CO2: 28 mmol/L (ref 22–32)
Calcium: 9 mg/dL (ref 8.9–10.3)
Chloride: 93 mmol/L — ABNORMAL LOW (ref 101–111)
Creatinine, Ser: 5.61 mg/dL — ABNORMAL HIGH (ref 0.61–1.24)
GFR calc Af Amer: 12 mL/min — ABNORMAL LOW (ref >60)
GFR calc non Af Amer: 11 mL/min — ABNORMAL LOW (ref >60)
Glucose, Bld: 344 mg/dL — ABNORMAL HIGH (ref 65–99)
Potassium: 4 mmol/L (ref 3.5–5.1)
Sodium: 135 mmol/L (ref 135–145)

## 2016-04-13 LAB — CBG MONITORING, ED: Glucose-Capillary: 309 mg/dL — ABNORMAL HIGH (ref 65–99)

## 2016-04-13 LAB — TROPONIN I: Troponin I: <0.03 ng/mL (ref <0.03)

## 2016-04-13 MED ORDER — CALCIUM ACETATE (PHOS BINDER) 667 MG PO CAPS
667.0000 mg | ORAL_CAPSULE | Freq: Three times a day (TID) | ORAL | Status: DC
  Administered 2016-04-14 (×2): 667 mg via ORAL
  Filled 2016-04-13 (×2): qty 1

## 2016-04-13 MED ORDER — PROMETHAZINE HCL 25 MG PO TABS: 25.0000 mg | ORAL_TABLET | Freq: Four times a day (QID) | ORAL | Status: DC | PRN

## 2016-04-13 MED ORDER — PANTOPRAZOLE SODIUM 40 MG PO TBEC
40.0000 mg | DELAYED_RELEASE_TABLET | Freq: Every day | ORAL | Status: DC
  Administered 2016-04-13 – 2016-04-14 (×2): 40 mg via ORAL
  Filled 2016-04-13 (×2): qty 1

## 2016-04-13 MED ORDER — FENOFIBRATE 54 MG PO TABS
54.0000 mg | ORAL_TABLET | Freq: Every day | ORAL | Status: DC
  Administered 2016-04-14: 54 mg via ORAL
  Filled 2016-04-13: qty 1

## 2016-04-13 MED ORDER — PRASUGREL HCL 10 MG PO TABS
10.0000 mg | ORAL_TABLET | Freq: Every day | ORAL | Status: DC
  Administered 2016-04-14 (×2): 10 mg via ORAL
  Filled 2016-04-13 (×2): qty 1

## 2016-04-13 MED ORDER — ACETAMINOPHEN 500 MG PO TABS: 500.0000 mg | ORAL_TABLET | Freq: Four times a day (QID) | ORAL | Status: DC | PRN

## 2016-04-13 MED ORDER — MIDODRINE HCL 5 MG PO TABS: 30.0000 mg | ORAL_TABLET | ORAL | Status: DC

## 2016-04-13 MED ORDER — NITROGLYCERIN 0.4 MG SL SUBL: 0.4000 mg | SUBLINGUAL_TABLET | SUBLINGUAL | Status: DC | PRN

## 2016-04-13 MED ORDER — INSULIN GLARGINE 100 UNIT/ML ~~LOC~~ SOLN
30.0000 [IU] | Freq: Every day | SUBCUTANEOUS | Status: DC
  Administered 2016-04-13: 30 [IU] via SUBCUTANEOUS
  Filled 2016-04-13 (×2): qty 0.3

## 2016-04-13 MED ORDER — ALLOPURINOL 100 MG PO TABS
200.0000 mg | ORAL_TABLET | Freq: Every day | ORAL | Status: DC
  Administered 2016-04-13 – 2016-04-14 (×2): 200 mg via ORAL
  Filled 2016-04-13 (×2): qty 2

## 2016-04-13 MED ORDER — BISACODYL 10 MG RE SUPP: 10.0000 mg | Freq: Every day | RECTAL | Status: DC | PRN

## 2016-04-13 MED ORDER — DOCUSATE SODIUM 100 MG PO CAPS
100.0000 mg | ORAL_CAPSULE | Freq: Three times a day (TID) | ORAL | Status: DC
Start: 2016-04-13 — End: 2016-04-14
  Administered 2016-04-13 – 2016-04-14 (×2): 100 mg via ORAL
  Filled 2016-04-13 (×2): qty 1

## 2016-04-13 MED ORDER — MIDODRINE HCL 5 MG PO TABS: 10.0000 mg | ORAL_TABLET | ORAL | Status: DC

## 2016-04-13 MED ORDER — LORATADINE 10 MG PO TABS
10.0000 mg | ORAL_TABLET | Freq: Every day | ORAL | Status: DC
  Administered 2016-04-13 – 2016-04-14 (×2): 10 mg via ORAL
  Filled 2016-04-13 (×2): qty 1

## 2016-04-13 MED ORDER — POLYETHYLENE GLYCOL 3350 17 G PO PACK: 17.0000 g | PACK | Freq: Every day | ORAL | Status: DC | PRN

## 2016-04-13 MED ORDER — ASPIRIN EC 81 MG PO TBEC
81.0000 mg | DELAYED_RELEASE_TABLET | Freq: Every day | ORAL | Status: DC
  Administered 2016-04-13: 81 mg via ORAL
  Filled 2016-04-13: qty 1

## 2016-04-13 MED ORDER — CALCIUM GLUCONATE 10 % IV SOLN: 1.0000 g | Freq: Once | INTRAVENOUS | Status: DC

## 2016-04-13 MED ORDER — SODIUM CHLORIDE 0.9 % IV BOLUS (SEPSIS): 2000.0000 mL | Freq: Once | INTRAVENOUS | Status: DC

## 2016-04-13 MED ORDER — NEPRO/CARBSTEADY PO LIQD
237.0000 mL | ORAL | Status: DC
  Administered 2016-04-14: 237 mL via ORAL

## 2016-04-13 MED ORDER — LEVOTHYROXINE SODIUM 112 MCG PO TABS
112.0000 ug | ORAL_TABLET | Freq: Every day | ORAL | Status: DC
  Administered 2016-04-14: 112 ug via ORAL
  Filled 2016-04-13: qty 1

## 2016-04-13 MED ORDER — HYDROMORPHONE HCL 2 MG PO TABS
1.0000 mg | ORAL_TABLET | Freq: Three times a day (TID) | ORAL | Status: DC | PRN
  Filled 2016-04-13: qty 1

## 2016-04-13 MED ORDER — ALBUTEROL SULFATE (2.5 MG/3ML) 0.083% IN NEBU: 2.5000 mg | INHALATION_SOLUTION | RESPIRATORY_TRACT | Status: DC | PRN

## 2016-04-13 MED ORDER — HEPARIN SODIUM (PORCINE) 5000 UNIT/ML IJ SOLN
5000.0000 [IU] | Freq: Three times a day (TID) | INTRAMUSCULAR | Status: DC
  Administered 2016-04-13 – 2016-04-14 (×2): 5000 [IU] via SUBCUTANEOUS
  Filled 2016-04-13 (×2): qty 1

## 2016-04-13 MED ORDER — ATORVASTATIN CALCIUM 80 MG PO TABS: 80.0000 mg | ORAL_TABLET | Freq: Every day | ORAL | Status: DC

## 2016-04-13 MED ORDER — FLUTICASONE PROPIONATE 50 MCG/ACT NA SUSP: 1.0000 | Freq: Every day | NASAL | Status: DC | PRN

## 2016-04-13 MED ORDER — INSULIN ASPART 100 UNIT/ML ~~LOC~~ SOLN
0.0000 [IU] | Freq: Three times a day (TID) | SUBCUTANEOUS | Status: DC
  Administered 2016-04-14: 15 [IU] via SUBCUTANEOUS

## 2016-04-13 MED ORDER — METOCLOPRAMIDE HCL 10 MG PO TABS
10.0000 mg | ORAL_TABLET | Freq: Three times a day (TID) | ORAL | Status: DC
  Administered 2016-04-13 – 2016-04-14 (×3): 10 mg via ORAL
  Filled 2016-04-13 (×3): qty 1

## 2016-04-13 MED ORDER — RENA-VITE PO TABS
1.0000 | ORAL_TABLET | Freq: Every day | ORAL | Status: DC
  Administered 2016-04-14 (×2): 1 via ORAL
  Filled 2016-04-13 (×2): qty 1

## 2016-04-13 MED ORDER — POLYVINYL ALCOHOL 1.4 % OP SOLN: 1.0000 [drp] | OPHTHALMIC | Status: DC | PRN

## 2016-04-13 MED ORDER — ROPINIROLE HCL 1 MG PO TABS: 2.0000 mg | ORAL_TABLET | Freq: Every day | ORAL | Status: DC

## 2016-04-13 NOTE — Telephone Encounter (Signed)
I spoke with Melissa at [facility] and gave her information from Dr. [name].

## 2016-04-13 NOTE — H&P (Addendum)
TRH H&P   Patient Demographics:    Dale [name], is a 50 y.o. male  MRN: [id number]   DOB - 1965/11/16  Admit Date - 04/13/2016  Outpatient Primary MD for the patient is ASRES,ALEHEGN, MD  Referring MD/NP/PA: Dr Lorenso Courier  Outpatient Specialists: Renal Dr. Clover Mealy  Patient coming from: from hemodialysis center  Chief Complaint  Patient presents with  . Hypotension      HPI:    Dale [name]  is a 50 y.o. male, with  history of chronic diastolic CHF, CAD, end-stage renal disease on dialysis, hypertension, hyperlipidemia, hypothyroidism, and type 1 diabetes presenting with hypotension from his dialysis unit.  Patient systolic blood pressure was 85 during dialysis, the patient reports he took his midodrine  before dialysis, reports blood pressure was low over the last few days, when he did not take his metoprolol, but reports he was still taking his pain medication and hydroxyzine, patient reports fatigue, generalized weakness, but denies any fever, chills, cough, productive sputum, diarrhea, abdominal pain fever or chills. - in ED workup significant for retina 5.6, hemoglobin of 11.6, negative troponin, chest x-ray with no evidence of active cardiopulmonary disease   Review of systems:    In addition to the HPI above, No Fever-chills, No Headache, No changes with Vision or hearing, No problems swallowing food or Liquids, No Chest pain, Cough or Shortness of Breath, No Abdominal pain, No Nausea or Vommitting, Bowel movements are regular, No Blood in stool or Urine, No dysuria, No new skin rashes or bruises, No new joints pains-aches,  No new weakness, tingling, numbness in any extremity,Generalized weakness and fatigue No recent weight gain or loss, No polyuria, polydypsia or polyphagia, No significant Mental Stressors.  A full 10 point Review of Systems was done, except  as stated above, all other Review of Systems were negative.   With Past History of the following :    Past Medical History:  Diagnosis Date  . Anemia of chronic disease   . Arthritis    "hands, right knee" (03/19/2014)  . CAD (coronary artery disease)    a. Abnl nuc ->LHC (11/15):  mid to dist Dx 80%, mid RCA 99% (functional CTO), dist RCA 80% >> PCI: balloon angioplasty to mid RCA (could not deliver stent) - staged PCI Rotoblator atherectomy/DES to [facility]. b. NSTEMI 12/2015 s/p DES to diagonal.  . Cholecystitis    a. 08/27/2011  . Chronic diastolic CHF (congestive heart failure) ([place])    a. Echo 3/13:  EF 55-60%;  b. Echo (11/15):  Mild LVH, EF 60-65%, no RWMA, Gr 1 DD, MAC, mild LAE, normal RVF, mild RAE;  c.  Echo 5/16:  severe LVH, EF 55-60%, no RWMA, Gr 1 DD, MAC, mild LAE  . Claustrophobia    when things get around his face.   . Difficult intubation    only once in 2015 at [facility] haven't had a problem since then  . ESRD (end stage renal disease) ([place])    a. 1995 s/p cadaveric transplant w/ susbequent failure after 18 yrs;  b.Dialysis initiated 07/2011 [facility] M[facility]  . Fibromyalgia   . Gastroparesis   . GERD (gastroesophageal reflux disease)   . Gout    PMH  . History of blood transfusion   . HLD (hyperlipidemia)   . Hypertension   . Hypothyroidism   . Inguinal lymphadenopathy    a. bilateral - s/p biopsy 07/2011  . Insulin dependent diabetes mellitus (HCC)    Type I  . Morbid obesity ([place])   . OSA (obstructive sleep apnea)    adjustable bed  . PAD (peripheral artery disease) (HCC)    a. s/p L BKA.  [name] Kitchen Pericardial effusion    a.  Small by CT 08/22/11;  b.  Large by CT 08/27/11. c. Not seen on 11/2015 echo.  . Pneumonia ~ 2007  . Tracheobronchomalacia       Past Surgical History:  Procedure Laterality Date  . AMPUTATION OF REPLICATED TOES Right   . ANGIOPLASTY  04/09/2014   RCA   . APPENDECTOMY  ~ 2006  . ARTERIOVENOUS GRAFT PLACEMENT Left 1993?   forearm  .  ARTERIOVENOUS GRAFT PLACEMENT Right 1993?   leg  . ARTERIOVENOUS GRAFT PLACEMENT Left    Thigh  . Arteriovenous Graft Removed Left    Thigh  . AV FISTULA PLACEMENT  07/29/2011   Procedure: ARTERIOVENOUS (AV) FISTULA CREATION;  Surgeon: Mal Misty, MD;  Location: [place];  Service: Vascular;  Laterality: Right;  Brachial cephalic  . AV FISTULA PLACEMENT Left 01/20/2015   Procedure: LIGATION OF RIGHT ARTERIOVENOUS FISTULA WITH EXCISION OF ANEURYSM;  Surgeon: Angelia Mould, MD;  Location: [place];  Service: Vascular;  Laterality: Left;  . AV FISTULA PLACEMENT Left 04/30/2015   Procedure: CREATION OF LEFT ARM BRACHIO-CEPHALIC ARTERIOVENOUS (AV) FISTULA ;  Surgeon: Angelia Mould, MD;  Location: [place];  Service: Vascular;  Laterality: Left;  . AV Graft PLACEMENT Left 1993?   "attempted one in my wrist; didn't take"  . BELOW KNEE LEG AMPUTATION Left 2010  . CARDIAC CATHETERIZATION  03/19/2014  . CARDIAC CATHETERIZATION  03/19/2014   Procedure: CORONARY BALLOON ANGIOPLASTY;  Surgeon: Burnell Blanks, MD;  Location: [facility] CATH LAB;  Service: Cardiovascular;;  . CARDIAC CATHETERIZATION N/A 01/01/2016   Procedure: Left Heart Cath and Coronary Angiography;  Surgeon: Troy Sine, MD;  Location: [place] CV LAB;  Service: Cardiovascular;  Laterality: N/A;  . CARDIAC CATHETERIZATION N/A 01/01/2016   Procedure: Coronary Stent Intervention;  Surgeon: Troy Sine, MD;  Location: [place] CV LAB;  Service: Cardiovascular;  Laterality: N/A;  . CARPAL TUNNEL RELEASE Bilateral after 2005  . CATARACT EXTRACTION W/ INTRAOCULAR LENS  IMPLANT, BILATERAL Bilateral 1990's  . COLONOSCOPY  08/29/2011   Procedure: COLONOSCOPY;  Surgeon: Jerene Bears, MD;  Location: [place];  Service: Gastroenterology;  Laterality: N/A;  . CORONARY ANGIOPLASTY WITH STENT PLACEMENT  04/09/2014       ptca/des mid lad   . ESOPHAGOGASTRODUODENOSCOPY N/A 12/25/2015   Procedure: ESOPHAGOGASTRODUODENOSCOPY (EGD);   Surgeon: Mauri Pole, MD;  Location: [facility] ENDOSCOPY;  Service: Endoscopy;  Laterality: N/A;  bedside  . EYE SURGERY Bilateral    cataract  . FEMORAL ARTERY REPAIR  04/09/2014   ANGIOSEAL  . INSERTION OF DIALYSIS CATHETER  08/01/2011   Procedure: INSERTION OF DIALYSIS CATHETER;  Surgeon: Rosetta Posner, MD;  Location: [facility] OR;  Service: Vascular;  Laterality: Right;  insertion of dialysis catheter on right internal jugular vein  . INSERTION OF DIALYSIS CATHETER Right 01/20/2015   Procedure: INSERTION OF DIALYSIS CATHETER - RIGHT INTERNAL JUGULAR ;  Surgeon: Angelia Mould, MD;  Location: [place];  Service: Vascular;  Laterality: Right;  . KIDNEY TRANSPLANT  04/25/1993  . LEFT HEART CATHETERIZATION WITH CORONARY ANGIOGRAM N/A 03/19/2014   Procedure: LEFT HEART CATHETERIZATION WITH CORONARY ANGIOGRAM;  Surgeon: Burnell Blanks, MD;  Location: [facility] CATH LAB;  Service: Cardiovascular;  Laterality: N/A;  . LYMPH NODE BIOPSY  08/10/2011   Procedure: LYMPH NODE BIOPSY;  Surgeon: Merrie Roof, MD;  Location: [place];  Service: General;  Laterality: Left;  left groin lymph biopsy  . NEUROPLASTY / TRANSPOSITION ULNAR NERVE AT ELBOW Left after 2005  . PERCUTANEOUS CORONARY ROTOBLATOR INTERVENTION (PCI-R) N/A 04/09/2014   Procedure: PERCUTANEOUS CORONARY ROTOBLATOR INTERVENTION (PCI-R);  Surgeon: Burnell Blanks, MD;  Location: [facility] CATH LAB;  Service: Cardiovascular;  Laterality: N/A;  . PERIPHERAL VASCULAR CATHETERIZATION N/A 12/25/2014   Procedure: Upper Extremity Venography;  Surgeon: Conrad Elba, MD;  Location: [place] CV LAB;  Service: Cardiovascular;  Laterality: N/A;  . PERIPHERAL VASCULAR CATHETERIZATION Left 02/24/2016   Procedure: Fistulagram;  Surgeon: Waynetta Sandy, MD;  Location: [place] CV LAB;  Service: Cardiovascular;  Laterality: Left;  . PERIPHERAL VASCULAR CATHETERIZATION Left 02/24/2016   Procedure: Peripheral Vascular Intervention;  Surgeon: Waynetta Sandy, MD;  Location: [place] CV LAB;  Service: Cardiovascular;  Laterality: Left;  AV FISTULA  . PERITONEAL CATHETER INSERTION    . PERITONEAL CATHETER REMOVAL    . REVISON OF ARTERIOVENOUS FISTULA Right 4/97/0263   Procedure: PLICATION / REVISION OF ARTERIOVENOUS FISTULA;  Surgeon: Angelia Mould, MD;  Location: [place];  Service: Vascular;  Laterality: Right;  . REVISON OF ARTERIOVENOUS FISTULA Left 09/01/2015   Procedure: SUPERFICIALIZATION OF LEFT ARM BRACHIOCEPHALIC ARTERIOVENOUS FISTULA;  Surgeon: Angelia Mould, MD;  Location: [place];  Service: Vascular;  Laterality: Left;  . SHOULDER ARTHROSCOPY Left ~ 2006   frozen  . TOE AMPUTATION Bilateral after 2005  . UMBILICAL HERNIA REPAIR  1990's  . VENOGRAM Right 07/28/2011   Procedure: VENOGRAM;  Surgeon: Conrad Conehatta, MD;  Location: [facility] CATH LAB;  Service: Cardiovascular;  Laterality: Right;  . VITRECTOMY Bilateral       Social History:     Social History  Substance Use Topics  . Smoking status: Never Smoker  . Smokeless tobacco: Never Used  . Alcohol use Yes     Comment: occasional     Lives - At home  Mobility - with prosthesis     Family History :     Family History  Problem Relation Age of Onset  . Colon polyps Father   . Diabetes Father   . Hypertension Father   . Hypertension Mother   . Diabetes Mother   . Hyperlipidemia Mother   . Breast cancer Maternal Aunt   . Bone cancer Maternal Aunt   . Liver cancer Maternal Aunt   . Malignant hyperthermia Neg Hx   . Heart attack Neg Hx   . Stroke Neg Hx   . Colon cancer Neg Hx   . Stomach cancer Neg Hx   . Pancreatic cancer Neg Hx   . Esophageal cancer Neg Hx       Home Medications:   Prior to Admission medications   Medication Sig Start Date  End Date Taking? Authorizing Provider  acetaminophen (TYLENOL) 500 MG tablet Take 500 mg by mouth daily as needed for pain. 01/16/16  Yes Historical Provider, MD  albuterol (PROVENTIL  HFA;VENTOLIN HFA) 108 (90 Base) MCG/ACT inhaler Inhale 2 puffs into the lungs every 6 (six) hours as needed for wheezing or shortness of breath. 01/26/16  Yes Brand Males, MD  allopurinol (ZYLOPRIM) 100 MG tablet Take 200 mg by mouth daily.  03/14/12  Yes Historical Provider, MD  aspirin EC 81 MG tablet Take 81 mg by mouth at bedtime.   Yes Historical Provider, MD  atorvastatin (LIPITOR) 80 MG tablet Take 1 tablet (80 mg total) by mouth daily at 6 PM. 01/04/16  Yes Florencia Reasons, MD  calcium acetate (PHOSLO) 667 MG capsule Take 667 mg by mouth See admin instructions. Take 1 capsule  by mouth with meals and 1 with snacks 09/19/12  Yes Historical Provider, MD  diphenhydrAMINE (BENADRYL) 25 mg capsule Take 1-2 capsules (25-50 mg total) by mouth every 6 (six) hours as needed for itching. Patient taking differently: Take 50 mg by mouth 2 (two) times daily as needed for itching or sleep.  04/11/14  Yes Rhonda G Barrett, PA-C  docusate sodium (COLACE) 100 MG capsule Take 100 mg by mouth 3 (three) times daily.   Yes Historical Provider, MD  fenofibrate (TRICOR) 48 MG tablet Take 1 tablet (48 mg total) by mouth daily. 02/27/15  Yes Shanker Kristeen Mans, MD  fluticasone (FLONASE) 50 MCG/ACT nasal spray Place 1 spray into both nostrils daily as needed for allergies.    Yes Historical Provider, MD  gabapentin (NEURONTIN) 300 MG capsule Take 300 mg by mouth 3 (three) times daily.   Yes Historical Provider, MD  GLUCAGON EMERGENCY 1 MG injection Inject 1 mg into the muscle daily as needed (low blood sugar).  01/19/12  Yes Historical Provider, MD  HYDROmorphone (DILAUDID) 2 MG tablet Take 1 tablet (2 mg total) by mouth 3 (three) times daily as needed for severe pain. Patient taking differently: Take 2 mg by mouth See admin instructions. Take 1 tablet (2 mg) by mouth daily at bedtime, may take an additional dose during the day as needed for severe pain 11/20/14  Yes Alvia Grove, PA-C  hydrOXYzine (ATARAX/VISTARIL) 10 MG  tablet Take 10 mg by mouth 3 (three) times daily as needed for itching. 04/05/16  Yes Historical Provider, MD  Insulin Human (INSULIN PUMP) 100 unit/ml SOLN Inject 1 each into the skin continuous. Humalog   Yes Historical Provider, MD  insulin lispro (HUMALOG) 100 UNIT/ML injection Inject 1-175 Units into the skin continuous. Up to 175 units continuous in pump,  Basal rates area as follows:  2400 1.5 units/hr, 0300 1.9 units/hr, 0800 1.45 units/hr, 1200 1.625 units/hr   Yes Historical Provider, MD  levothyroxine (SYNTHROID, LEVOTHROID) 112 MCG tablet Take 112 mcg by mouth at bedtime.   Yes Historical Provider, MD  loratadine (CLARITIN) 10 MG tablet Take 10 mg by mouth daily.   Yes Historical Provider, MD  metoCLOPramide (REGLAN) 5 MG tablet Take 2 tablets (10 mg total) by mouth 4 (four) times daily - after meals and at bedtime. 01/21/16  Yes Velvet Bathe, MD  midodrine (PROAMATINE) 10 MG tablet Take 30 mg by mouth See admin instructions. Takes 1 tab in mornings of dialysis and takes 2 tabs 30 mins before starting dialysis (m,w,f)   Yes Historical Provider, MD  multivitamin (RENA-VIT) TABS tablet Take 1 tablet by mouth daily.   Yes Historical Provider, MD  nitroGLYCERIN (NITROSTAT) 0.4 MG SL tablet Place 1 tablet (0.4 mg total) under the tongue every 5 (five) minutes as needed for chest pain. 02/15/16  Yes Burnell Blanks, MD  Nutritional Supplements (FEEDING SUPPLEMENT, NEPRO CARB STEADY,) LIQD Take 237 mLs by mouth daily. 01/04/16  Yes Florencia Reasons, MD  omeprazole (PRILOSEC) 40 MG capsule Take 40 mg by mouth 2 (two) times daily.   Yes Historical Provider, MD  OXYGEN Inhale 3 L into the lungs at bedtime.   Yes Historical Provider, MD  Polyethyl Glycol-Propyl Glycol (SYSTANE) 0.4-0.3 % SOLN Place 1 drop into both eyes as needed (dry eyes).   Yes Historical Provider, MD  polyethylene glycol (MIRALAX / GLYCOLAX) packet Take 17 g by mouth daily. Patient taking differently: Take 17 g by mouth daily as  needed for mild constipation.  02/06/16  Yes Belkys A Regalado, MD  prasugrel (EFFIENT) 10 MG TABS tablet Take 1 tablet (10 mg total) by mouth daily. 01/28/16 04/27/16 Yes Eileen Stanford, PA-C  promethazine (PHENERGAN) 25 MG tablet Take 1 tablet (25 mg total) by mouth every 6 (six) hours as needed for nausea or vomiting. For nausea 11/27/14  Yes Liliane Shi, PA-C  rOPINIRole (REQUIP) 2 MG tablet Take 2 mg by mouth daily with supper.    Yes Historical Provider, MD  metoprolol tartrate (LOPRESSOR) 25 MG tablet Take 1 tablet (25 mg total) by mouth 2 (two) times daily. Patient taking differently: Take 12.5 mg by mouth 2 (two) times daily.  01/28/16   Eileen Stanford, PA-C     Allergies:     Allergies  Allergen Reactions  . Contrast Media [Iodinated Diagnostic Agents] Rash and Itching    Systemic itching and transient rash appearance within hours of receiving contrast  Systemic itching and transient rash appearance within hours of receiving contrast  Redman Syndrome  . Prednisone Other (See Comments)    Increases sugar levels  . Rocephin [Ceftriaxone] Itching  . Adhesive [Tape] Itching and Other (See Comments)    Blisters on arm from adhesive tape at dialysis   . Amoxicillin Itching and Rash    .Has patient had a PCN reaction causing immediate rash, facial/tongue/throat swelling, SOB or lightheadedness with hypotension: Yes Has patient had a PCN reaction causing severe rash involving mucus membranes or skin necrosis: No Has patient had a PCN reaction that required hospitalization No Has patient had a PCN reaction occurring within the last 10 years: No If all of the above answers are "NO", then may proceed with Cephalosporin use.  Kary Kos [Ticagrelor] Nausea And Vomiting  . Buprenorphine Hcl Hives, Nausea Only and Rash  . Clopidogrel Rash  . Hydrocodone Rash  . Morphine Hives, Nausea Only and Rash  . Ciprofloxacin Itching and Rash  . Clindamycin/Lincomycin Hives and Itching  .  Codeine Hives, Itching and Rash  . Doxycycline Rash  . Enalapril Rash    cough  . Levofloxacin Hives and Itching  . Mobic [Meloxicam] Hives and Rash  . Oxycodone Rash  . Vasotec Cough     Physical Exam:   Vitals  Blood pressure (!) 83/55, pulse 95, temperature 97.5 F (36.4 C), temperature source Oral, resp. rate 14, SpO2 93 %.   1. General Morbidly obese male lying in bed in NAD,    2. Normal affect and insight, Not Suicidal or Homicidal, Awake Alert, Oriented X 3.  3. No F.N deficits, ALL C.Nerves Intact, Strength 5/5 all 4 extremities, Sensation intact all 4 extremities, Plantars down going.  4. Ears and Eyes appear Normal, Conjunctivae clear, PERRLA. Moist Oral Mucosa.  5. Supple Neck, No JVD, No cervical lymphadenopathy appriciated, No Carotid Bruits.  6. Symmetrical Chest wall movement, Good air movement bilaterally, CTAB.  7. RRR, No Gallops, Rubs or Murmurs, No Parasternal Heave.  8. Positive Bowel Sounds, Abdomen Soft, No tenderness, No organomegaly appriciated,No rebound -guarding or rigidity.  9.  No Cyanosis, Normal Skin Turgor, No Skin Rash or Bruise.  10. Good muscle tone,  joints appear normal , no effusions, Left BKA, chronic right lower extremity skin changes  11. No Palpable Lymph Nodes in Neck or Axillae    Data Review:    CBC  Recent Labs Lab 04/13/16 1458  WBC 7.4  HGB 11.6*  HCT 39.7  PLT 272  MCV 100.8*  MCH 29.4  MCHC 29.2*  RDW 17.3*  LYMPHSABS 2.1  MONOABS 0.5  EOSABS 0.3  BASOSABS 0.1   ------------------------------------------------------------------------------------------------------------------  Chemistries   Recent Labs Lab 04/13/16 1458  NA 135  K 4.0  CL 93*  CO2 28  GLUCOSE 344*  BUN 22*  CREATININE 5.61*  CALCIUM 9.0   ------------------------------------------------------------------------------------------------------------------ CrCl cannot be calculated (Unknown ideal  weight.). ------------------------------------------------------------------------------------------------------------------ No results for input(s): TSH, T4TOTAL, T3FREE, THYROIDAB in the last 72 hours.  Invalid input(s): FREET3  Coagulation profile No results for input(s): INR, PROTIME in the last 168 hours. ------------------------------------------------------------------------------------------------------------------- No results for input(s): DDIMER in the last 72 hours. -------------------------------------------------------------------------------------------------------------------  Cardiac Enzymes  Recent Labs Lab 04/13/16 1458  TROPONINI <0.03   ------------------------------------------------------------------------------------------------------------------    Component Value Date/Time   BNP 1,185.6 (H) 02/02/2016 0930     ---------------------------------------------------------------------------------------------------------------  Urinalysis    Component Value Date/Time   COLORURINE YELLOW 06/16/2013 2059   APPEARANCEUR CLEAR 06/16/2013 2059   LABSPEC 1.018 06/16/2013 2059   PHURINE 5.5 06/16/2013 2059   GLUCOSEU NEGATIVE 06/16/2013 2059   HGBUR SMALL (A) 06/16/2013 2059   BILIRUBINUR NEGATIVE 06/16/2013 2059   [place] NEGATIVE 06/16/2013 2059   PROTEINUR 100 (A) 06/16/2013 2059   UROBILINOGEN 0.2 06/16/2013 2059   NITRITE NEGATIVE 06/16/2013 2059   LEUKOCYTESUR NEGATIVE 06/16/2013 2059    ----------------------------------------------------------------------------------------------------------------   Imaging Results:    Dg Chest 2 View  Result Date: 04/13/2016 CLINICAL DATA:  Hypertension and hypoxia following dialysis today. EXAM: CHEST  2 VIEW COMPARISON:  One-view chest x-ray 02/25/2016 FINDINGS: The heart is mildly enlarged. There is no edema or effusion. No focal airspace disease is present. The visualized soft tissues and bony thorax are  unremarkable. IMPRESSION: Cardiomegaly without failure. Next item no acute cardiopulmonary disease. Electronically Signed   By: San Morelle M.D.   On: 04/13/2016 16:51      Assessment & Plan:    Active Problems:   OSA (obstructive sleep apnea)   Coronary artery disease involving native coronary artery of native heart with unstable angina pectoris (HCC)   ESRD on dialysis ([place])   HLD (hyperlipidemia)   Chronic pain   Diabetes mellitus ([place])   Sleep related hypoxia   Oxygen dependent   Hypotension   Hypotension - Patient with known hypertension in the past on dialysis days for which she is on midodrine, but this is more prolonged symptomatic course, likely due to infectious process as patient afebrile, with no leukocytosis, nontoxic appearing, certainly medications and are contributing, will discontinue metoprolol and hydroxyzine, and will decrease Dilaudid by half, and will continue to hold Neurontin for now, appears to be euvolemic, so  no need for IV fluid so far, as well  unclear if patient will need to adjust his weight during dialysis, discussed with renal who will evaluate in a.m..  Diabetes mellitus - Patient on insulin pump, he tells me his endocrinologist want to transition him to Lantus 36 units and Humalog sliding scale, he already called the prescriptions to his pharmacy, he has no refills for his insulin pump, so we'll start him on Lantus 30 units daily at bedtime, and insulin sliding scale, and pump to be discontinued after he receives Lantus, instructions were given to patients and nursing staff.  Obstructive sleep apnea/sleep related hypoxia - Patient could not tolerate the BiPAP recently, currently on 3 L nasal cannula at bedtime  Chronic pain - Continue Dilaudid at lower dose,  Hyperlipidemia - Continue Lipitor  ESRD - Dialysis Monday Wednesday Friday, dialyzed today  History of CAD - Eyes any chest pain or shortness of breath, continue with aspirin, hold  beta blockers secondary to hypotension   diastolic heart failure - Appears euvolemic, volume management with dialysis   DVT Prophylaxis Heparin - SCDs   AM Labs Ordered, also please review Full Orders  Family Communication: Admission, patients condition and plan of care including tests being ordered have been discussed with the patient  who indicate understanding and agree with the plan and Code Status.  Code Status Full  Likely DC to  Home  Condition GUARDED    Consults called: Dr Augustin Coupe from renal  Admission status: observation  Time spent in minutes : 60 minutes   Jazzman Loughmiller M.D on 04/13/2016 at 6:14 PM  Between 7am to 7pm - Pager - [phone]. After 7pm go to [email or web address] - password [facility]  [facility] - Office  [phone]

## 2016-04-13 NOTE — ED Notes (Signed)
Pt transported to xray 

## 2016-04-13 NOTE — ED Notes (Signed)
Brought patient back to room via wheelchair from the bathroom; patient states he was successful with having a bowel movement; patient undressed, in gown, on monitor, continuous pulse oximetry and blood pressure cuff; visitor at bedside

## 2016-04-13 NOTE — ED Notes (Signed)
Took patient to the bathroom by wheelchair to have a bowel movement; patient stated "I took myself off the bedpan because I couldn't get enough downward pressure to push down so the doctor said it ws ok for me to go to the bathroom. And I would like to do that before you put that stuff on me (pointing to the telemetry leads); visitor waiting in rooom

## 2016-04-13 NOTE — ED Notes (Signed)
Patient physically moved from hallway to [facility] with wife in tow; patient placed on bedpan to have a bowel movement; wife at bedside

## 2016-04-13 NOTE — Progress Notes (Signed)
Received report from ED RN, Jetty Duhamel.

## 2016-04-13 NOTE — ED Provider Notes (Signed)
[facility] DEPT Provider Note   CSN: [id number] Arrival date & time: 04/13/16  1251     History   Chief Complaint Chief Complaint  Patient presents with  . Hypotension    HPI Dale [name] is a 50 y.o. male.  HPI [name] is a 50 year old male with a possible history of chronic diastolic CHF, CAD, end-stage renal disease on dialysis, hypertension, hyperlipidemia, hypothyroidism, and type 1 diabetes presenting with hypotension from his dialysis unit.  Per his report, his systolic blood pressure was 85 during dialysis. He notes that when this occurs, he feels very fatigued and short of breath.  Patient notes his BP started being low since over the weekend and states is SBP has been as low as the 70s on non-dialysis days. He doesn't have an explanation for his low BPs but stated the HD centered advised him to come to the ED to be evaluated for infection.  He took midodrine 31m prior to HD today which is his typical dose, however his BP still dropped lower than usual.   He had a low grade temperature up to 100.3 2 days ago. Patient is constipated. He had a hard stool yesterday.  No cough, congestion, abdominal pain, otalgias, skin infections, chills. He's anuric. Wife notes that they intermittently check his oxygen saturation and it has been down to the low 80s%.  His RLE is in an uHaematologist he states the home health nurse did not seem concerned about infection when she put this on yesterday. His wife noted today that there is small opening in his skin under his pannus, the patient has not had any symptoms of this.   He typically only takes Dilaudid 228mper day. He hasn't taken this since Monday evening due to his low BPs. .  He hasn't taken metoprolol since Sunday evening due to low BPs.  The only other changes in his medication is renal [place] Past Medical History:  Diagnosis Date  . Anemia of chronic disease   . Arthritis    "hands, right  knee" (03/19/2014)  . CAD (coronary artery disease)    a. Abnl nuc ->LHC (11/15):  mid to dist Dx 80%, mid RCA 99% (functional CTO), dist RCA 80% >> PCI: balloon angioplasty to mid RCA (could not deliver stent) - staged [facility] NSTEMI 12/2015 s/p DES to diagonal.  . Cholecystitis    a. 08/27/2011  . Chronic diastolic CHF (congestive heart failure) ([facility]   a. Echo 3/13:  EF 55-60%;  b. Echo (11/15):  Mild LVH, EF 60-65%, no RWMA, Gr 1 DD, MAC, mild LAE, normal RVF, mild RAE;  c.  Echo 5/16:  severe LVH, EF 55-60%, no RWMA, Gr 1 DD, MAC, mild LAE  . Claustrophobia    when things get around his face.   . Difficult int[facility]    [facility]  . ESRD (end stage renal disease) ([facility]   a. 1995 s/p cadaveric transplant w/ susbequent failure after 18 yrs;  b.[facility]  . Fibromyalgia   . Gastroparesis   . GERD (gastroesophageal reflux disease)   . Gout    PMH  . History of blood transfusion   . HLD (hyperlipidemia)   . Hypertension   . Hypothyroidism   . Inguinal lymphadenopathy    a. bilateral - s/p biopsy 07/2011  . Insulin dependent  diabetes mellitus (HCC)    Type I  . Morbid obesity ([place])   . OSA (obstructive sleep apnea)    adjustable bed  . PAD (peripheral artery disease) (HCC)    a. s/p L BKA.  [name] [facility] Pericardial effusion    a.  Small by CT 08/22/11;  b.  Large by CT 08/27/11. c. Not seen on 11/2015 echo.  . Pneumonia ~ 2007  . Tracheobronchomalacia     Patient Active Problem List   Diagnosis Date Noted  . Rectal bleeding 03/24/2016  . IDDM (insulin dependent diabetes mellitus) ([place]) 03/24/2016  . Oxygen dependent 03/24/2016  . Antiplatelet or antithrombotic long-term use 03/24/2016  . Sleep related hypoxia 03/03/2016  . Chronic diastolic heart failure ([place]) 02/25/2016  . Tracheobronchomalacia 02/04/2016  . Anemia of chronic disease   . Diabetes mellitus with  complication ([place])   . Iron deficiency anemia   . Gastrointestinal hemorrhage with melena 12/21/2015  . Pulmonary edema 03/09/2015  . Chronic pain 03/09/2015  . Diabetes mellitus ([place]) 03/09/2015  . Accelerated hypertension 02/24/2015  . DM (diabetes mellitus) type II controlled with renal manifestation ([place]) 02/24/2015  . HLD (hyperlipidemia) 11/27/2014  . ESRD on dialysis ([place]) 09/30/2014  . Coronary artery disease involving native coronary artery of native heart with unstable angina pectoris ([place]) 03/20/2014  . Allergic reaction to dye 03/20/2014  . NSTEMI (non-ST elevated myocardial infarction) ([place]) 03/19/2014  . HTN (hypertension) 02/18/2014  . Colitis, ischemic ([place]) 08/31/2011  . Pericardial effusion 08/27/2011  . History of renal transplant 07/22/2011  . S/P BKA (below knee amputation) ([place]) 07/22/2011  . OSA (obstructive sleep apnea) 07/22/2011  . Anemia 07/22/2011  . Diabetes mellitus with nephropathy ([place]) 07/22/2011    Past Surgical History:  Procedure Laterality Date  . AMPUTATION OF REPLICATED TOES Right   . ANGIOPLASTY  04/09/2014   RCA   . APPENDECTOMY  ~ 2006  . ARTERIOVENOUS GRAFT PLACEMENT Left 1993?   forearm  . ARTERIOVENOUS GRAFT PLACEMENT Right 1993?   leg  . ARTERIOVENOUS GRAFT PLACEMENT Left    Thigh  . Arteriovenous Graft Removed Left    Thigh  . AV FISTULA PLACEMENT  07/29/2011   Procedure: ARTERIOVENOUS (AV) FISTULA CREATION;  Surgeon: Mal Misty, MD;  Location: [facility];  Service: Vascular;  Laterality: Right;  Brachial cephalic  . AV FISTULA PLACEMENT Left 01/20/2015   Procedure: LIGATION OF RIGHT ARTERIOVENOUS FISTULA WITH EXCISION OF ANEURYSM;  Surgeon: Angelia Mould, MD;  Location: [facility];  Service: Vascular;  Laterality: Left;  . AV FISTULA PLACEMENT Left 04/30/2015   Procedure: CREATION OF LEFT ARM BRACHIO-CEPHALIC ARTERIOVENOUS (AV) FISTULA ;  Surgeon: Angelia Mould, MD;  Location: [facility];  Service: Vascular;  Laterality: Left;    . AV Graft PLACEMENT Left 1993?   "attempted one in my wrist; didn't take"  . BELOW KNEE LEG AMPUTATION Left 2010  . CARDIAC CATHETERIZATION  03/19/2014  . CARDIAC CATHETERIZATION  03/19/2014   Procedure: CORONARY BALLOON ANGIOPLASTY;  Surgeon: Burnell Blanks, MD;  Location: [facility] CATH LAB;  Service: Cardiovascular;;  . CARDIAC CATHETERIZATION N/A 01/01/2016   Procedure: Left Heart Cath and Coronary Angiography;  Surgeon: Troy Sine, MD;  Location: [place] CV LAB;  Service: Cardiovascular;  Laterality: N/A;  . CARDIAC CATHETERIZATION N/A 01/01/2016   Procedure: Coronary Stent Intervention;  Surgeon: Troy Sine, MD;  Location: [place] [facility] CV LAB;  Service: Cardiovascular;  Laterality: N/A;  . CARPAL TUNNEL RELEASE Bilateral after 2005  . CATARACT EXTRACTION  W/ INTRAOCULAR LENS  IMPLANT, BILATERAL Bilateral 1990's  . COLONOSCOPY  08/29/2011   Procedure: COLONOSCOPY;  Surgeon: Jerene Bears, MD;  Location: [facility];  Service: Gastroenterology;  Laterality: N/A;  . CORONARY ANGIOPLASTY WITH STENT PLACEMENT  04/09/2014       ptca/des mid lad   . ESOPHAGOGASTRODUODENOSCOPY N/A 12/25/2015   Procedure: ESOPHAGOGASTRODUODENOSCOPY (EGD);  Surgeon: Mauri Pole, MD;  Location[facility] ENDOSCOPY;  Service: Endoscopy;  Laterality: N/A;  bedside  . EYE SURGERY Bilateral    cataract  . FEMORAL ARTERY REPAIR  04/09/2014   ANGIOSEAL  . INSERTION OF DIALYSIS CATHETER  08/01/2011   Procedure: INSERTION OF DIALYSIS CATHETER;  Surgeon: Rosetta Posner, MD;  Location: [place];  Service: Vascular;  Laterality: Right;  insertion of dialysis catheter on right internal jugular vein  . INSERTION OF DIALYSIS CATHETER Right 01/20/2015   Procedure: INSERTION OF DIALYSIS CATHETER - RIGHT INTERNAL JUGULAR ;  Surgeon: Angelia Mould, MD;  Location: [facility];  Service: Vascular;  Laterality: Right;  . KIDNEY TRANSPLANT  04/25/1993  . LEFT HEART CATHETERIZATION WITH CORONARY ANGIOGRAM N/A 03/19/2014   Procedure:  LEFT HEART CATHETERIZATION WITH CORONARY ANGIOGRAM;  Surgeon: Burnell Blanks, MD;  Location: [facility] CATH LAB;  Service: Cardiovascular;  Laterality: N/A;  . LYMPH NODE BIOPSY  08/10/2011   Procedure: LYMPH NODE BIOPSY;  Surgeon: Merrie Roof, MD;  Location: [facility];  Service: General;  Laterality: Left;  left groin lymph biopsy  . NEUROPLASTY / TRANSPOSITION ULNAR NERVE AT ELBOW Left after 2005  . PERCUTANEOUS CORONARY ROTOBLATOR INTERVENTION (PCI-R) N/A 04/09/2014   Procedure: PERCUTANEOUS CORONARY ROTOBLATOR INTERVENTION (PCI-R);  Surgeon: Burnell Blanks, MD;  Location: [facility] CATH LAB;  Service: Cardiovascular;  Laterality: N/A;  . PERIPHERAL VASCULAR CATHETERIZATION N/A 12/25/2014   Procedure: Upper Extremity Venography;  Surgeon: Conrad [place], MD;  Location: [facility] CV LAB;  Service: Cardiovascular;  Laterality: N/A;  . PERIPHERAL VASCULAR CATHETERIZATION Left 02/24/2016   Procedure: Fistulagram;  Surgeon: Waynetta Sandy, MD;  Location: [place] CV LAB;  Service: Cardiovascular;  Laterality: Left;  . PERIPHERAL VASCULAR CATHETERIZATION Left 02/24/2016   Procedure: Peripheral Vascular Intervention;  Surgeon: Waynetta Sandy, MD;  Location: [place] CV LAB;  Service: Cardiovascular;  Laterality: Left;  AV FISTULA  . PERITONEAL CATHETER INSERTION    . PERITONEAL CATHETER REMOVAL    . REVISON OF ARTERIOVENOUS FISTULA Right 2/35/5732   Procedure: PLICATION / REVISION OF ARTERIOVENOUS FISTULA;  Surgeon: Angelia Mould, MD;  Location: [facility];  Service: Vascular;  Laterality: Right;  . REVISON OF ARTERIOVENOUS FISTULA Left 09/01/2015   Procedure: SUPERFICIALIZATION OF LEFT ARM BRACHIOCEPHALIC ARTERIOVENOUS FISTULA;  Surgeon: Angelia Mould, MD;  Location: [facility];  Service: Vascular;  Laterality: Left;  . SHOULDER ARTHROSCOPY Left ~ 2006   frozen  . TOE AMPUTATION Bilateral after 2005  . UMBILICAL HERNIA REPAIR  1990's  . VENOGRAM Right 07/28/2011    Procedure: VENOGRAM;  Surgeon: Conrad [place], MD;  Location: [facility] CATH LAB;  Service: Cardiovascular;  Laterality: Right;  . VITRECTOMY Bilateral        Home Medications    Prior to Admission medications   Medication Sig Start Date End Date Taking? Authorizing Provider  acetaminophen (TYLENOL) 500 MG tablet Take 500 mg by mouth daily as needed for pain. 01/16/16  Yes Historical Provider, MD  allopurinol (ZYLOPRIM) 100 MG tablet Take 200 mg by mouth daily.  03/14/12  Yes Historical Provider, MD  aspirin  EC 81 MG tablet Take 81 mg by mouth at bedtime.   Yes Historical Provider, MD  atorvastatin (LIPITOR) 80 MG tablet Take 1 tablet (80 mg total) by mouth daily at 6 PM. 01/04/16  Yes Florencia Reasons, MD  calcium acetate (PHOSLO) 667 MG capsule Take 667 mg by mouth See admin instructions. Take 1 capsule  by mouth with meals and 1 with snacks 09/19/12  Yes Historical Provider, MD  diphenhydrAMINE (BENADRYL) 25 mg capsule Take 1-2 capsules (25-50 mg total) by mouth every 6 (six) hours as needed for itching. Patient taking differently: Take 50 mg by mouth 2 (two) times daily as needed for itching or sleep.  04/11/14  Yes Rhonda G Barrett, PA-C  docusate sodium (COLACE) 100 MG capsule Take 100 mg by mouth 3 (three) times daily.   Yes Historical Provider, MD  fenofibrate (TRICOR) 48 MG tablet Take 1 tablet (48 mg total) by mouth daily. 02/27/15  Yes Shanker Kristeen Mans, MD  fluticasone (FLONASE) 50 MCG/ACT nasal spray Place 1 spray into both nostrils daily as needed for allergies.    Yes Historical Provider, MD  gabapentin (NEURONTIN) 300 MG capsule Take 300 mg by mouth 3 (three) times daily.   Yes Historical Provider, MD  HYDROmorphone (DILAUDID) 2 MG tablet Take 1 tablet (2 mg total) by mouth 3 (three) times daily as needed for severe pain. Patient taking differently: Take 2 mg by mouth See admin instructions. Take 1 tablet (2 mg) by mouth daily at bedtime, may take an additional dose during the day as needed for  severe pain 11/20/14  Yes Alvia Grove, PA-C  hydrOXYzine (ATARAX/VISTARIL) 10 MG tablet Take 10 mg by mouth 3 (three) times daily as needed for itching. 04/05/16  Yes Historical Provider, MD  Insulin Human (INSULIN PUMP) 100 unit/ml SOLN Inject 1 each into the skin continuous. Humalog   Yes Historical Provider, MD  insulin lispro (HUMALOG) 100 UNIT/ML injection Inject 1-175 Units into the skin continuous. Up to 175 units continuous in pump,  Basal rates area as follows:  2400 1.5 units/hr, 0300 1.9 units/hr, 0800 1.45 units/hr, 1200 1.625 units/hr   Yes Historical Provider, MD  levothyroxine (SYNTHROID, LEVOTHROID) 100 MCG tablet Take 100 mcg by mouth at bedtime.    Yes Historical Provider, MD  levothyroxine (SYNTHROID, LEVOTHROID) 112 MCG tablet Take 112 mcg by mouth at bedtime.   Yes Historical Provider, MD  loratadine (CLARITIN) 10 MG tablet Take 10 mg by mouth daily.   Yes Historical Provider, MD  metoCLOPramide (REGLAN) 5 MG tablet Take 2 tablets (10 mg total) by mouth 4 (four) times daily - after meals and at bedtime. 01/21/16  Yes Velvet Bathe, MD  midodrine (PROAMATINE) 10 MG tablet Take 30 mg by mouth See admin instructions. Takes 1 tab in mornings of dialysis and takes 2 tabs 30 mins before starting dialysis (m,w,f)   Yes Historical Provider, MD  multivitamin (RENA-VIT) TABS tablet Take 1 tablet by mouth daily.   Yes Historical Provider, MD  nitroGLYCERIN (NITROSTAT) 0.4 MG SL tablet Place 1 tablet (0.4 mg total) under the tongue every 5 (five) minutes as needed for chest pain. 02/15/16  Yes Burnell Blanks, MD  Nutritional Supplements (FEEDING SUPPLEMENT, NEPRO CARB STEADY,) LIQD Take 237 mLs by mouth daily. 01/04/16  Yes Florencia Reasons, MD  omeprazole (PRILOSEC) 40 MG capsule Take 40 mg by mouth 2 (two) times daily.   Yes Historical Provider, MD  OXYGEN Inhale 3 L into the lungs at bedtime.   Yes  Historical Provider, MD  Polyethyl Glycol-Propyl Glycol (SYSTANE) 0.4-0.3 % SOLN Place 1 drop  into both eyes as needed (dry eyes).   Yes Historical Provider, MD  polyethylene glycol (MIRALAX / GLYCOLAX) packet Take 17 g by mouth daily. Patient taking differently: Take 17 g by mouth daily as needed for mild constipation.  02/06/16  Yes Belkys A Regalado, MD  prasugrel (EFFIENT) 10 MG TABS tablet Take 1 tablet (10 mg total) by mouth daily. 01/28/16 04/27/16 Yes Eileen Stanford, PA-C  promethazine (PHENERGAN) 25 MG tablet Take 1 tablet (25 mg total) by mouth every 6 (six) hours as needed for nausea or vomiting. For nausea 11/27/14  Yes Liliane Shi, PA-C  rOPINIRole (REQUIP) 2 MG tablet Take 2 mg by mouth daily with supper.    Yes Historical Provider, MD  albuterol (PROVENTIL HFA;VENTOLIN HFA) 108 (90 Base) MCG/ACT inhaler Inhale 2 puffs into the lungs every 6 (six) hours as needed for wheezing or shortness of breath. 01/26/16   Brand Males, MD  Ferric Citrate (AURYXIA) 1 GM 210 MG(Fe) TABS Take 2 g by mouth 3 (three) times daily with meals.    Historical Provider, MD  GLUCAGON EMERGENCY 1 MG injection Inject 1 mg into the muscle daily as needed (low blood sugar).  01/19/12   Historical Provider, MD  metoprolol tartrate (LOPRESSOR) 25 MG tablet Take 1 tablet (25 mg total) by mouth 2 (two) times daily. Patient taking differently: Take 12.5 mg by mouth 2 (two) times daily.  01/28/16   Eileen Stanford, PA-C  Na Sulfate-K Sulfate-Mg Sulf 17.5-3.13-1.6 GM/180ML SOLN Take 1 kit by mouth as directed. 03/24/16   Jessica D Zehr, PA-C  pantoprazole (PROTONIX) 40 MG tablet Take 1 tablet (40 mg total) by mouth 2 (two) times daily. 03/24/16   Loralie Champagne, PA-C    Family History Family History  Problem Relation Age of Onset  . Colon polyps Father   . Diabetes Father   . Hypertension Father   . Hypertension Mother   . Diabetes Mother   . Hyperlipidemia Mother   . Breast cancer Maternal Aunt   . Bone cancer Maternal Aunt   . Liver cancer Maternal Aunt   . Malignant hyperthermia Neg Hx   .  Heart attack Neg Hx   . Stroke Neg Hx   . Colon cancer Neg Hx   . Stomach cancer Neg Hx   . Pancreatic cancer Neg Hx   . Esophageal cancer Neg Hx     Social History Social History  Substance Use Topics  . Smoking status: Never Smoker  . Smokeless tobacco: Never Used  . Alcohol use Yes     Comment: occasional     Allergies   Contrast media [iodinated diagnostic agents]; Prednisone; Rocephin [ceftriaxone]; Adhesive [tape]; Amoxicillin; Brilinta [ticagrelor]; Buprenorphine hcl; Clopidogrel; Hydrocodone; Morphine; Ciprofloxacin; Clindamycin/lincomycin; Codeine; Doxycycline; Enalapril; Levofloxacin; Mobic [meloxicam]; Oxycodone; and Vasotec   Review of Systems Review of Systems  Constitutional: Positive for activity change and fatigue. Negative for appetite change, chills and fever.  HENT: Negative.   Eyes: Negative.   Respiratory: Positive for shortness of breath. Negative for cough, chest tightness, wheezing and stridor.   Cardiovascular: Negative for chest pain, palpitations and leg swelling.  Gastrointestinal: Negative for abdominal distention, abdominal pain, constipation, diarrhea, nausea and vomiting.  Endocrine: Negative.   Genitourinary: Negative.   Musculoskeletal: Positive for gait problem. Negative for arthralgias, back pain, neck pain and neck stiffness.  Skin: Positive for rash and wound.  Neurological: Positive for  weakness and light-headedness. Negative for dizziness, syncope, numbness and headaches.  Hematological: Negative.   Psychiatric/Behavioral: Negative.      Physical Exam Updated Vital Signs BP (!) 88/54   Pulse 88   Temp 97.5 F (36.4 C) (Oral)   Resp 20   SpO2 97%   Physical Exam  Constitutional: He is oriented to person, place, and time. He appears well-developed and well-nourished. No distress.  HENT:  Head: Normocephalic and atraumatic.  Right Ear: External ear normal.  Left Ear: External ear normal.  Nose: Nose normal.  Mouth/Throat:  Oropharynx is clear and moist. No oropharyngeal exudate.  Eyes: Conjunctivae are normal. Right eye exhibits no discharge. Left eye exhibits no discharge. No scleral icterus.  Neck: Normal range of motion. Neck supple. No JVD present.  Cardiovascular: Normal rate, regular rhythm and intact distal pulses.   No murmur heard. Difficult exam due to body habitus  Pulmonary/Chest: Effort normal and breath sounds normal. No stridor. No respiratory distress. He has no wheezes. He has no rales. He exhibits no tenderness.  Distant breath sounds  Abdominal: Soft. Bowel sounds are normal. There is no tenderness. There is no rebound. A hernia is present.  protuberant abdomen   Musculoskeletal:  S/p left AKA. Right LE with red/violecous discoloration. Mild pitting edema. Ulceration over the anterior shin without evidence of infection, some sloughing around the edges  Lymphadenopathy:    He has no cervical adenopathy.  Neurological: He is alert and oriented to person, place, and time. He exhibits normal muscle tone.  Skin: Skin is warm. Capillary refill takes less than 2 seconds. He is not diaphoretic.  Psychiatric: He has a normal mood and affect. His behavior is normal.     ED Treatments / Results  Labs (all labs ordered are listed, but only abnormal results are displayed) Labs Reviewed  CBC WITH DIFFERENTIAL/PLATELET - Abnormal; Notable for the following:       Result Value   RBC 3.94 (*)    Hemoglobin 11.6 (*)    MCV 100.8 (*)    MCHC 29.2 (*)    RDW 17.3 (*)    All other components within normal limits  BASIC METABOLIC PANEL - Abnormal; Notable for the following:    Chloride 93 (*)    Glucose, Bld 344 (*)    BUN 22 (*)    Creatinine, Ser 5.61 (*)    GFR calc non Af Amer 11 (*)    GFR calc Af Amer 12 (*)    All other components within normal limits  CULTURE, BLOOD (ROUTINE X 2)  CULTURE, BLOOD (ROUTINE X 2)  TROPONIN I    EKG  EKG Interpretation  Date/Time:  Wednesday April 13 2016 14:22:10 EST Ventricular Rate:  96 PR Interval:    QRS Duration: 91 QT Interval:  380 QTC Calculation: 481 R Axis:   83 Text Interpretation:  Sinus rhythm Borderline prolonged QT interval No STEMI.  Confirmed by LONG MD, JOSHUA [phone]) on 04/13/2016 3:45:34 PM       Radiology Dg Chest 2 View  Result Date: 04/13/2016 CLINICAL DATA:  Hypertension and hypoxia following dialysis today. EXAM: CHEST  2 VIEW COMPARISON:  One-view chest x-ray 02/25/2016 FINDINGS: The heart is mildly enlarged. There is no edema or effusion. No focal airspace disease is present. The visualized soft tissues and bony thorax are unremarkable. IMPRESSION: Cardiomegaly without failure. Next item no acute cardiopulmonary disease. Electronically Signed   By: San Morelle M.D.   On: 04/13/2016 16:51  Procedures Procedures (including critical care time)  Medications Ordered in ED Medications - No data to display   Initial Impression / Assessment and Plan / ED Course  I have reviewed the triage vital signs and the nursing notes.  Pertinent labs & imaging results that were available during my care of the patient were reviewed by me and considered in my medical decision making (see chart for details).  Clinical Course     1350: given persistent hypotension per the patient, will get CBC, BMET, troponin, blood cultures, and EKG. Patient does not appear dehydrated.   1530: no leukocytosis, Hgb 11.6 which is higher than baseline. Troponin negative. EKG reassuring. BPs still 80/50s. On updating family, pt noted to be hypoxic from 84-90% on RA, however this is with sleeping. On waking him up, O2 improved to 88-96% with good waveform. Given persistent symptomatic hypotension over the last 5 days without a known cause will have him placed in observation. CXR ordered and pending given hypoxia. No antibiotics given at this time as no source of infection noted.   1715: CXR with cardiomegaly, however no evidence  of volume overload. Spoke with [facility] hospitalist who will resume care and place orders.    Final Clinical Impressions(s) / ED Diagnoses   Final diagnoses:  Hypotension, unspecified hypotension type    New Prescriptions New Prescriptions   No medications on file     Archie Patten, MD 04/13/16 [place], MD 04/13/16 2052

## 2016-04-13 NOTE — ED Notes (Signed)
Pt given cup of ice water 

## 2016-04-13 NOTE — ED Triage Notes (Signed)
Pt here from Dialysis unit with c/o low b/p , pt did complete his treatment , cbg 868  S/bp 85 systolic , no complaints on arrival

## 2016-04-13 NOTE — Telephone Encounter (Signed)
He had a drug eluting stent placed in September 2017. He should not stop DAPT for elective procedures until September 2018.   Lauree Chandler

## 2016-04-14 ENCOUNTER — Ambulatory Visit: Payer: Medicare Other | Admitting: Adult Health

## 2016-04-14 DIAGNOSIS — Z992 Dependence on renal dialysis: Secondary | ICD-10-CM | POA: Diagnosis not present

## 2016-04-14 DIAGNOSIS — G4733 Obstructive sleep apnea (adult) (pediatric): Secondary | ICD-10-CM | POA: Diagnosis not present

## 2016-04-14 DIAGNOSIS — I959 Hypotension, unspecified: Secondary | ICD-10-CM | POA: Diagnosis not present

## 2016-04-14 DIAGNOSIS — N186 End stage renal disease: Secondary | ICD-10-CM | POA: Diagnosis not present

## 2016-04-14 LAB — GLUCOSE, CAPILLARY
Glucose-Capillary: 152 mg/dL — ABNORMAL HIGH (ref 65–99)
Glucose-Capillary: 273 mg/dL — ABNORMAL HIGH (ref 65–99)
Glucose-Capillary: 292 mg/dL — ABNORMAL HIGH (ref 65–99)
Glucose-Capillary: 370 mg/dL — ABNORMAL HIGH (ref 65–99)
Glucose-Capillary: 45 mg/dL — ABNORMAL LOW (ref 65–99)

## 2016-04-14 LAB — MRSA PCR SCREENING: MRSA by PCR: POSITIVE — AB

## 2016-04-14 MED ORDER — INSULIN GLARGINE 100 UNIT/ML ~~LOC~~ SOLN: 30.0000 [IU] | Freq: Every day | SUBCUTANEOUS | 11 refills | Status: DC

## 2016-04-14 MED ORDER — MIDODRINE HCL 10 MG PO TABS: 10.0000 mg | ORAL_TABLET | Freq: Three times a day (TID) | ORAL | Status: DC

## 2016-04-14 MED ORDER — MIDODRINE HCL 5 MG PO TABS
10.0000 mg | ORAL_TABLET | ORAL | Status: AC
  Administered 2016-04-14: 10 mg via ORAL
  Filled 2016-04-14: qty 2

## 2016-04-14 MED ORDER — PNEUMOCOCCAL VAC POLYVALENT 25 MCG/0.5ML IJ INJ
0.5000 mL | INJECTION | INTRAMUSCULAR | Status: AC
  Administered 2016-04-14: 0.5 mL via INTRAMUSCULAR
  Filled 2016-04-14: qty 0.5

## 2016-04-14 MED ORDER — DEXTROSE 50 % IV SOLN
25.0000 g | INTRAVENOUS | Status: DC | PRN
  Administered 2016-04-14: 50 mL via INTRAVENOUS

## 2016-04-14 MED ORDER — HYDROMORPHONE HCL 2 MG PO TABS: 1.0000 mg | ORAL_TABLET | Freq: Three times a day (TID) | ORAL | 0 refills | Status: DC | PRN

## 2016-04-14 MED ORDER — MIDODRINE HCL 5 MG PO TABS: 10.0000 mg | ORAL_TABLET | Freq: Three times a day (TID) | ORAL | Status: DC

## 2016-04-14 MED ORDER — SODIUM CHLORIDE 0.9 % IV BOLUS (SEPSIS): 250.0000 mL | Freq: Once | INTRAVENOUS | Status: DC

## 2016-04-14 MED ORDER — SODIUM CHLORIDE 0.9 % IV BOLUS (SEPSIS)
250.0000 mL | Freq: Once | INTRAVENOUS | Status: AC
  Administered 2016-04-14: 250 mL via INTRAVENOUS

## 2016-04-14 MED ORDER — DEXTROSE 50 % IV SOLN
INTRAVENOUS | Status: AC
  Administered 2016-04-14: 50 mL via INTRAVENOUS
  Filled 2016-04-14: qty 50

## 2016-04-14 MED ORDER — SODIUM CHLORIDE 0.9 % IV BOLUS (SEPSIS)
500.0000 mL | Freq: Once | INTRAVENOUS | Status: AC
  Administered 2016-04-14: 500 mL via INTRAVENOUS

## 2016-04-14 MED ORDER — INSULIN ASPART 100 UNIT/ML ~~LOC~~ SOLN: 0.0000 [IU] | Freq: Three times a day (TID) | SUBCUTANEOUS | 11 refills | Status: DC

## 2016-04-14 MED ORDER — INSULIN GLARGINE 100 UNIT/ML ~~LOC~~ SOLN: 26.0000 [IU] | Freq: Every day | SUBCUTANEOUS | 11 refills | Status: DC

## 2016-04-14 MED ORDER — MUPIROCIN 2 % EX OINT
1.0000 "application " | TOPICAL_OINTMENT | Freq: Two times a day (BID) | CUTANEOUS | Status: DC
  Administered 2016-04-14: 1 via NASAL
  Filled 2016-04-14: qty 22

## 2016-04-14 NOTE — Progress Notes (Signed)
Post bolus and midodrine 10 mg BP 88/51.  On-call NP kirby notified.  Will continue to monitor and notify as needed.

## 2016-04-14 NOTE — Progress Notes (Signed)
[facility] notified about Patient's BPs, and will keep an eye on patient.

## 2016-04-14 NOTE — Discharge Summary (Signed)
Dale [name], is a 50 y.o. male  DOB [birth year]  MRN [id number].  Admission date:  04/13/2016  Admitting Physician  Albertine Patricia, MD  Discharge Date:  04/14/2016   Primary MD  ASRES,ALEHEGN, MD  Recommendations for primary care physician for things to follow:  - .Further work-up for chronic hypotension including  checking cortisol, TSH, and consideration of repeat Echo.  Admission Diagnosis  Hypotension, unspecified hypotension type [I95.9]   Discharge Diagnosis  Hypotension, unspecified hypotension type [I95.9]    Active Problems:   OSA (obstructive sleep apnea)   Coronary artery disease involving native coronary artery of native heart with unstable angina pectoris (HCC)   ESRD on dialysis ([place])   HLD (hyperlipidemia)   Chronic pain   Diabetes mellitus ([place])   Sleep related hypoxia   Oxygen dependent   Hypotension      Past Medical History:  Diagnosis Date  . Anemia of chronic disease   . Arthritis    "hands, right knee" (03/19/2014)  . CAD (coronary artery disease)    a. Abnl nuc ->LHC (11/15):  mid to dist Dx 80%, mid RCA 99% (functional CTO), dist RCA 80% >> PCI: balloon angioplasty to mid RCA (could not deliver stent) - staged PCI Rotoblator atherectomy/DES to [facility]. b. NSTEMI 12/2015 s/p DES to diagonal.  . Cholecystitis    a. 08/27/2011  . Chronic diastolic CHF (congestive heart failure) ([place])    a. Echo 3/13:  EF 55-60%;  b. Echo (11/15):  Mild LVH, EF 60-65%, no RWMA, Gr 1 DD, MAC, mild LAE, normal RVF, mild RAE;  c.  Echo 5/16:  severe LVH, EF 55-60%, no RWMA, Gr 1 DD, MAC, mild LAE  . Claustrophobia    when things get around his face.   . Difficult intubation    only once in 2015 at [facility] haven't had a problem since then  . ESRD (end stage renal disease) ([place])    a. 1995 s/p cadaveric transplant w/ susbequent failure after 18 yrs;  b.Dialysis initiated 07/2011 [facility] M-W-F  . Fibromyalgia   . Gastroparesis   . GERD (gastroesophageal reflux disease)   . Gout    PMH  . History of blood transfusion   . HLD (hyperlipidemia)   . Hypertension   . Hypothyroidism   . Inguinal lymphadenopathy    a. bilateral - s/p biopsy 07/2011  . Insulin dependent diabetes mellitus (HCC)    Type I  . Morbid obesity ([place])   . OSA (obstructive sleep apnea)    adjustable bed  . PAD (peripheral artery disease) (HCC)    a. s/p L BKA.  [name] Kitchen Pericardial effusion    a.  Small by CT 08/22/11;  b.  Large by CT 08/27/11. c. Not seen on 11/2015 echo.  . Pneumonia ~ 2007  . Tracheobronchomalacia     Past Surgical History:  Procedure Laterality Date  . AMPUTATION OF REPLICATED TOES Right   . ANGIOPLASTY  04/09/2014   RCA   . APPENDECTOMY  ~  2006  . ARTERIOVENOUS GRAFT PLACEMENT Left 1993?   forearm  . ARTERIOVENOUS GRAFT PLACEMENT Right 1993?   leg  . ARTERIOVENOUS GRAFT PLACEMENT Left    Thigh  . Arteriovenous Graft Removed Left    Thigh  . AV FISTULA PLACEMENT  07/29/2011   Procedure: ARTERIOVENOUS (AV) FISTULA CREATION;  Surgeon: Mal Misty, MD;  Location: [place];  Service: Vascular;  Laterality: Right;  Brachial cephalic  . AV FISTULA PLACEMENT Left 01/20/2015   Procedure: LIGATION OF RIGHT ARTERIOVENOUS FISTULA WITH EXCISION OF ANEURYSM;  Surgeon: Angelia Mould, MD;  Location: [place];  Service: Vascular;  Laterality: Left;  . AV FISTULA PLACEMENT Left 04/30/2015   Procedure: CREATION OF LEFT ARM BRACHIO-CEPHALIC ARTERIOVENOUS (AV) FISTULA ;  Surgeon: Angelia Mould, MD;  Location: [place];  Service: Vascular;  Laterality: Left;  . AV Graft PLACEMENT Left 1993?   "attempted one in my wrist; didn't take"  . BELOW KNEE LEG AMPUTATION Left 2010  . CARDIAC CATHETERIZATION  03/19/2014  . CARDIAC CATHETERIZATION  03/19/2014   Procedure: CORONARY BALLOON ANGIOPLASTY;  Surgeon: Burnell Blanks, MD;  Location: [facility] CATH LAB;  Service: Cardiovascular;;  .  CARDIAC CATHETERIZATION N/A 01/01/2016   Procedure: Left Heart Cath and Coronary Angiography;  Surgeon: Troy Sine, MD;  Location: [place] CV LAB;  Service: Cardiovascular;  Laterality: N/A;  . CARDIAC CATHETERIZATION N/A 01/01/2016   Procedure: Coronary Stent Intervention;  Surgeon: Troy Sine, MD;  Location: [place] CV LAB;  Service: Cardiovascular;  Laterality: N/A;  . CARPAL TUNNEL RELEASE Bilateral after 2005  . CATARACT EXTRACTION W/ INTRAOCULAR LENS  IMPLANT, BILATERAL Bilateral 1990's  . COLONOSCOPY  08/29/2011   Procedure: COLONOSCOPY;  Surgeon: Jerene Bears, MD;  Location: [place];  Service: Gastroenterology;  Laterality: N/A;  . CORONARY ANGIOPLASTY WITH STENT PLACEMENT  04/09/2014       ptca/des mid lad   . ESOPHAGOGASTRODUODENOSCOPY N/A 12/25/2015   Procedure: ESOPHAGOGASTRODUODENOSCOPY (EGD);  Surgeon: Mauri Pole, MD;  Location: [facility] [place] ENDOSCOPY;  Service: Endoscopy;  Laterality: N/A;  bedside  . EYE SURGERY Bilateral    cataract  . FEMORAL ARTERY REPAIR  04/09/2014   ANGIOSEAL  . INSERTION OF DIALYSIS CATHETER  08/01/2011   Procedure: INSERTION OF DIALYSIS CATHETER;  Surgeon: Rosetta Posner, MD;  Location: [place];  Service: Vascular;  Laterality: Right;  insertion of dialysis catheter on right internal jugular vein  . INSERTION OF DIALYSIS CATHETER Right 01/20/2015   Procedure: INSERTION OF DIALYSIS CATHETER - RIGHT INTERNAL JUGULAR ;  Surgeon: Angelia Mould, MD;  Location: [place];  Service: Vascular;  Laterality: Right;  . KIDNEY TRANSPLANT  04/25/1993  . LEFT HEART CATHETERIZATION WITH CORONARY ANGIOGRAM N/A 03/19/2014   Procedure: LEFT HEART CATHETERIZATION WITH CORONARY ANGIOGRAM;  Surgeon: Burnell Blanks, MD;  Location: [facility] CATH LAB;  Service: Cardiovascular;  Laterality: N/A;  . LYMPH NODE BIOPSY  08/10/2011   Procedure: LYMPH NODE BIOPSY;  Surgeon: Merrie Roof, MD;  Location: [place];  Service: General;  Laterality: Left;  left groin lymph biopsy    . NEUROPLASTY / TRANSPOSITION ULNAR NERVE AT ELBOW Left after 2005  . PERCUTANEOUS CORONARY ROTOBLATOR INTERVENTION (PCI-R) N/A 04/09/2014   Procedure: PERCUTANEOUS CORONARY ROTOBLATOR INTERVENTION (PCI-R);  Surgeon: Burnell Blanks, MD;  Location: [facility] CATH LAB;  Service: Cardiovascular;  Laterality: N/A;  . PERIPHERAL VASCULAR CATHETERIZATION N/A 12/25/2014   Procedure: Upper Extremity Venography;  Surgeon: Conrad Delta, MD;  Location: [place]  CV LAB;  Service: Cardiovascular;  Laterality: N/A;  . PERIPHERAL VASCULAR CATHETERIZATION Left 02/24/2016   Procedure: Fistulagram;  Surgeon: Waynetta Sandy, MD;  Location: [place] CV LAB;  Service: Cardiovascular;  Laterality: Left;  . PERIPHERAL VASCULAR CATHETERIZATION Left 02/24/2016   Procedure: Peripheral Vascular Intervention;  Surgeon: Waynetta Sandy, MD;  Location: [place] CV LAB;  Service: Cardiovascular;  Laterality: Left;  AV FISTULA  . PERITONEAL CATHETER INSERTION    . PERITONEAL CATHETER REMOVAL    . REVISON OF ARTERIOVENOUS FISTULA Right 5/80/9983   Procedure: PLICATION / REVISION OF ARTERIOVENOUS FISTULA;  Surgeon: Angelia Mould, MD;  Location: [place];  Service: Vascular;  Laterality: Right;  . REVISON OF ARTERIOVENOUS FISTULA Left 09/01/2015   Procedure: SUPERFICIALIZATION OF LEFT ARM BRACHIOCEPHALIC ARTERIOVENOUS FISTULA;  Surgeon: Angelia Mould, MD;  Location: [place];  Service: Vascular;  Laterality: Left;  . SHOULDER ARTHROSCOPY Left ~ 2006   frozen  . TOE AMPUTATION Bilateral after 2005  . UMBILICAL HERNIA REPAIR  1990's  . VENOGRAM Right 07/28/2011   Procedure: VENOGRAM;  Surgeon: Conrad Duenweg, MD;  Location: [facility] CATH LAB;  Service: Cardiovascular;  Laterality: Right;  . VITRECTOMY Bilateral        History of present illness and  Hospital Course:     Kindly see H&P for history of present illness and admission details, please review complete Labs, Consult reports and Test reports  for all details in brief  HPI  from the history and physical done on the day of admission 04/13/2016 Dale [name]  is a 50 y.o. male, with  history of chronic diastolic CHF, CAD, end-stage renal disease on dialysis, hypertension, hyperlipidemia, hypothyroidism, and type 1 diabetes presenting with hypotension from his dialysis unit.  Patient systolic blood pressure was 85 during dialysis, the patient reports he took his midodrine  before dialysis, reports blood pressure was low over the last few days, when he did not take his metoprolol, but reports he was still taking his pain medication and hydroxyzine, patient reports fatigue, generalized weakness, but denies any fever, chills, cough, productive sputum, diarrhea, abdominal pain fever or chills. - in ED workup significant for retina 5.6, hemoglobin of 11.6, negative troponin, chest x-ray with no evidence of active cardiopulmonary disease   Hospital Course  Hypotension - Patient with known hypertension in the past on dialysis days for which he is on midodrine, uses up to 3 mg of midodrine on hemodialysis days , input greatly appreciated, his EDW may be too low , but he likely will need to be adjusted during next dialysis , renal will arrange for that , will increase his midodrine to 10 mg oral 3 times a day every day and not only on hemodialysis days . - Continue to hold medication was contributed to low blood pressure, so metoprolol/hydroxyzine/Benadryl has been stopped on discharge, his Dilaudid dose has decreased by half  - No evidence of infectious process, afebrile, no leukocytosis - Patient is asymptomatic, eager to go home,   Diabetes mellitus - Patient on insulin pump, he tells me his endocrinologist want to transition him to Lantus 36 units and Humalog sliding scale, his endocrinologist already called the prescriptions to his pharmacy, he was started on Lantus 30 units at bedtime and insulin sliding scale, had episode of hypoglycemia  this a.m., but this is most likely as he received his breakfast late, will be discharged on Lantus 26 units and Humalog on sliding scale, he tells me he has prescription  does not need refill, instructed oif CBG uncontrolled tomorrow to go to 36 units as per his endocrinologist recommendation.  Obstructive sleep apnea/sleep related hypoxia - Patient could not tolerate the BiPAP recently, currently on 3 L nasal cannula at bedtime  Chronic pain - Continue Dilaudid at lower dose,  Hyperlipidemia - Continue Lipitor  ESRD - Dialysis Monday Wednesday Friday, dialyzed today  History of CAD -Denies any chest pain or shortness of breath, continue with aspirin and Effient, hold beta blockers secondary to hypotension   diastolic heart failure - Appears euvolemic, volume management with dialysis       Discharge Condition:  Stable      Discharge Instructions  and  Discharge Medications    Discharge Instructions    Discharge instructions    Complete by:  As directed    Follow with Primary MD ASRES,ALEHEGN, MD in 7 days   Get CBC, CMP, checked  by Primary MD next visit.    Activity: As tolerated with Full fall precautions use walker/cane & assistance as needed   Disposition Home    Diet: renal cab modified with fluid restriction , with feeding assistance and aspiration precautions.     On your next visit with your primary care physician please Get Medicines reviewed and adjusted.   Please request your Prim.MD to go over all Hospital Tests and Procedure/Radiological results at the follow up, please get all Hospital records sent to your Prim MD by signing hospital release before you go home.   If you experience worsening of your admission symptoms, develop shortness of breath, life threatening emergency, suicidal or homicidal thoughts you must seek medical attention immediately by calling 911 or calling your MD immediately  if symptoms less severe.  You Must read  complete instructions/literature along with all the possible adverse reactions/side effects for all the Medicines you take and that have been prescribed to you. Take any new Medicines after you have completely understood and accpet all the possible adverse reactions/side effects.   Do not drive, operating heavy machinery, perform activities at heights, swimming or participation in water activities or provide baby sitting services if your were admitted for syncope or siezures until you have seen by Primary MD or a Neurologist and advised to do so again.  Do not drive when taking Pain medications.    Do not take more than prescribed Pain, Sleep and Anxiety Medications  Special Instructions: If you have smoked or chewed Tobacco  in the last 2 yrs please stop smoking, stop any regular Alcohol  and or any Recreational drug use.  Wear Seat belts while driving.   Please note  You were cared for by a hospitalist during your hospital stay. If you have any questions about your discharge medications or the care you received while you were in the hospital after you are discharged, you can call the unit and asked to speak with the hospitalist on call if the hospitalist that took care of you is not available. Once you are discharged, your primary care physician will handle any further medical issues. Please note that NO REFILLS for any discharge medications will be authorized once you are discharged, as it is imperative that you return to your primary care physician (or establish a relationship with a primary care physician if you do not have one) for your aftercare needs so that they can reassess your need for medications and monitor your lab values.   Increase activity slowly    Complete by:  As directed  Allergies as of 04/14/2016      Reactions   Contrast Media [iodinated Diagnostic Agents] Rash, Itching   Systemic itching and transient rash appearance within hours of receiving contrast  Systemic  itching and transient rash appearance within hours of receiving contrast  Redman Syndrome   Prednisone Other (See Comments)   Increases sugar levels   Rocephin [ceftriaxone] Itching   Adhesive [tape] Itching, Other (See Comments)   Blisters on arm from adhesive tape at dialysis   Amoxicillin Itching, Rash   .Has patient had a PCN reaction causing immediate rash, facial/tongue/throat swelling, SOB or lightheadedness with hypotension: Yes Has patient had a PCN reaction causing severe rash involving mucus membranes or skin necrosis: No Has patient had a PCN reaction that required hospitalization No Has patient had a PCN reaction occurring within the last 10 years: No If all of the above answers are "NO", then may proceed with Cephalosporin use.   Brilinta [ticagrelor] Nausea And Vomiting   Buprenorphine Hcl Hives, Nausea Only, Rash   Clopidogrel Rash   Hydrocodone Rash   Morphine Hives, Nausea Only, Rash   Ciprofloxacin Itching, Rash   Clindamycin/lincomycin Hives, Itching   Codeine Hives, Itching, Rash   Doxycycline Rash   Enalapril Rash   cough   Levofloxacin Hives, Itching   Mobic [meloxicam] Hives, Rash   Oxycodone Rash   Vasotec Cough      Medication List    STOP taking these medications   diphenhydrAMINE 25 mg capsule Commonly known as:  BENADRYL   hydrOXYzine 10 MG tablet Commonly known as:  ATARAX/VISTARIL   insulin lispro 100 UNIT/ML injection Commonly known as:  HUMALOG   insulin pump Soln   metoprolol tartrate 25 MG tablet Commonly known as:  LOPRESSOR     TAKE these medications   acetaminophen 500 MG tablet Commonly known as:  TYLENOL Take 500 mg by mouth daily as needed for pain.   albuterol 108 (90 Base) MCG/ACT inhaler Commonly known as:  PROVENTIL HFA;VENTOLIN HFA Inhale 2 puffs into the lungs every 6 (six) hours as needed for wheezing or shortness of breath.   allopurinol 100 MG tablet Commonly known as:  ZYLOPRIM Take 200 mg by mouth daily.     aspirin EC 81 MG tablet Take 81 mg by mouth at bedtime.   atorvastatin 80 MG tablet Commonly known as:  LIPITOR Take 1 tablet (80 mg total) by mouth daily at 6 PM.   calcium acetate 667 MG capsule Commonly known as:  PHOSLO Take 667 mg by mouth See admin instructions. Take 1 capsule  by mouth with meals and 1 with snacks   docusate sodium 100 MG capsule Commonly known as:  COLACE Take 100 mg by mouth 3 (three) times daily.   feeding supplement (NEPRO CARB STEADY) Liqd Take 237 mLs by mouth daily.   fenofibrate 48 MG tablet Commonly known as:  TRICOR Take 1 tablet (48 mg total) by mouth daily.   fluticasone 50 MCG/ACT nasal spray Commonly known as:  FLONASE Place 1 spray into both nostrils daily as needed for allergies.   gabapentin 300 MG capsule Commonly known as:  NEURONTIN Take 300 mg by mouth 3 (three) times daily.   GLUCAGON EMERGENCY 1 MG injection Generic drug:  glucagon Inject 1 mg into the muscle daily as needed (low blood sugar).   HYDROmorphone 2 MG tablet Commonly known as:  DILAUDID Take 0.5 tablets (1 mg total) by mouth 3 (three) times daily as needed for severe pain. What changed:  how much to take   insulin aspart 100 UNIT/ML injection Commonly known as:  novoLOG Inject 0-15 Units into the skin 3 (three) times daily with meals.   insulin glargine 100 UNIT/ML injection Commonly known as:  LANTUS Inject 0.26 mLs (26 Units total) into the skin at bedtime.   levothyroxine 112 MCG tablet Commonly known as:  SYNTHROID, LEVOTHROID Take 112 mcg by mouth at bedtime.   loratadine 10 MG tablet Commonly known as:  CLARITIN Take 10 mg by mouth daily.   metoCLOPramide 5 MG tablet Commonly known as:  REGLAN Take 2 tablets (10 mg total) by mouth 4 (four) times daily - after meals and at bedtime.   midodrine 10 MG tablet Commonly known as:  PROAMATINE Take 1 tablet (10 mg total) by mouth 3 (three) times daily. Takes 1 tab in mornings of dialysis and takes  2 tabs 30 mins before starting dialysis (m,w,f) What changed:  how much to take  when to take this   multivitamin Tabs tablet Take 1 tablet by mouth daily.   nitroGLYCERIN 0.4 MG SL tablet Commonly known as:  NITROSTAT Place 1 tablet (0.4 mg total) under the tongue every 5 (five) minutes as needed for chest pain.   omeprazole 40 MG capsule Commonly known as:  PRILOSEC Take 40 mg by mouth 2 (two) times daily.   OXYGEN Inhale 3 L into the lungs at bedtime.   polyethylene glycol packet Commonly known as:  MIRALAX / GLYCOLAX Take 17 g by mouth daily. What changed:  when to take this  reasons to take this   prasugrel 10 MG Tabs tablet Commonly known as:  EFFIENT Take 1 tablet (10 mg total) by mouth daily.   promethazine 25 MG tablet Commonly known as:  PHENERGAN Take 1 tablet (25 mg total) by mouth every 6 (six) hours as needed for nausea or vomiting. For nausea   rOPINIRole 2 MG tablet Commonly known as:  REQUIP Take 2 mg by mouth daily with supper.   SYSTANE 0.4-0.3 % Soln Generic drug:  Polyethyl Glycol-Propyl Glycol Place 1 drop into both eyes as needed (dry eyes).         Diet and Activity recommendation: See Discharge Instructions above   Consults obtained -  renal   Major procedures and Radiology Reports - PLEASE review detailed and final reports for all details, in brief -      Dg Chest 2 View  Result Date: 04/13/2016 CLINICAL DATA:  Hypertension and hypoxia following dialysis today. EXAM: CHEST  2 VIEW COMPARISON:  One-view chest x-ray 02/25/2016 FINDINGS: The heart is mildly enlarged. There is no edema or effusion. No focal airspace disease is present. The visualized soft tissues and bony thorax are unremarkable. IMPRESSION: Cardiomegaly without failure. Next item no acute cardiopulmonary disease. Electronically Signed   By: San Morelle M.D.   On: 04/13/2016 16:51    Micro Results    Recent Results (from the past 240 hour(s))    MRSA PCR Screening     Status: Abnormal   Collection Time: 04/14/16  2:39 AM  Result Value Ref Range Status   MRSA by PCR POSITIVE (A) NEGATIVE Final    Comment:        The GeneXpert MRSA Assay (FDA approved for NASAL specimens only), is one component of a comprehensive MRSA colonization surveillance program. It is not intended to diagnose MRSA infection nor to guide or monitor treatment for MRSA infections. RESULT CALLED TO, READ BACK BY AND VERIFIED WITH: [facility] RN  AT 8016 04/14/16 BY A.DAVIS        Today   Subjective:   Dale [name] today has no headache,no chest or  abdominal pain, feels much better wants to go home today.   Objective:   Blood pressure (!) 105/52, pulse 87, temperature 98.2 F (36.8 C), temperature source Oral, resp. rate 18, height 5\' 7"  (1.702 m), weight 121.6 kg (268 lb 1.3 oz), SpO2 99 %.   Intake/Output Summary (Last 24 hours) at 04/14/16 1531 Last data filed at 04/14/16 0558  Gross per 24 hour  Intake              150 ml  Output                0 ml  Net              150 ml    Exam Awake Alert, Oriented x 3,Morbidly obese No new F.N deficits, Normal affect Supple Neck,No JVD,  Symmetrical Chest wall movement, Good air movement bilaterally, CTAB RRR,No Gallops,Rubs or new Murmurs, No Parasternal Heave +ve B.Sounds, Abd Soft, Non tender, No rebound -guarding or rigidity. No Cyanosis, chronic  right lower extremity changes, left BKA  Data Review   CBC w Diff:  Lab Results  Component Value Date   WBC 7.4 04/13/2016   HGB 11.6 (L) 04/13/2016   HCT 39.7 04/13/2016   PLT 272 04/13/2016   LYMPHOPCT 29 04/13/2016   MONOPCT 7 04/13/2016   EOSPCT 4 04/13/2016   BASOPCT 1 04/13/2016    CMP:  Lab Results  Component Value Date   NA 135 04/13/2016   K 4.0 04/13/2016   CL 93 (L) 04/13/2016   CO2 28 04/13/2016   BUN 22 (H) 04/13/2016   CREATININE 5.61 (H) 04/13/2016   PROT 6.5 02/26/2016   ALBUMIN 2.5 (L) 02/27/2016   BILITOT  0.6 02/26/2016   ALKPHOS 88 02/26/2016   AST 66 (H) 02/26/2016   ALT 35 02/26/2016  .   Total Time in preparing paper work, data evaluation and todays exam - 35 minutes  Lauraann Missey M.D on 04/14/2016 at 3:31 PM  [facility]   Office  [phone]

## 2016-04-14 NOTE — Progress Notes (Signed)
Patient BP 78/48 manual. On-call NP Baltazar Najjar notified.  Will continue to monitor and Notify NP as needed.

## 2016-04-14 NOTE — Progress Notes (Signed)
NURSING PROGRESS NOTE  Dale [name] [id number] Discharge Data: 04/14/2016 4:44 PM Attending Provider: Albertine Patricia, MD XBM:[id number], MD     [name] to be D/C'd Home per MD order.  Discussed with the patient the After Visit Summary and all questions fully answered. All IV's discontinued with no bleeding noted. All belongings returned to patient for patient to take home.   Last Vital Signs:  Blood pressure (!) 105/52, pulse 87, temperature 98.2 F (36.8 C), temperature source Oral, resp. rate 18, height 5\' 7"  (1.702 m), weight 121.6 kg (268 lb 1.3 oz), SpO2 99 %.  Discharge Medication List Allergies as of 04/14/2016      Reactions   Contrast Media [iodinated Diagnostic Agents] Rash, Itching   Systemic itching and transient rash appearance within hours of receiving contrast  Systemic itching and transient rash appearance within hours of receiving contrast  Redman Syndrome   Prednisone Other (See Comments)   Increases sugar levels   Rocephin [ceftriaxone] Itching   Adhesive [tape] Itching, Other (See Comments)   Blisters on arm from adhesive tape at dialysis   Amoxicillin Itching, Rash   .Has patient had a PCN reaction causing immediate rash, facial/tongue/throat swelling, SOB or lightheadedness with hypotension: Yes Has patient had a PCN reaction causing severe rash involving mucus membranes or skin necrosis: No Has patient had a PCN reaction that required hospitalization No Has patient had a PCN reaction occurring within the last 10 years: No If all of the above answers are "NO", then may proceed with Cephalosporin use.   Brilinta [ticagrelor] Nausea And Vomiting   Buprenorphine Hcl Hives, Nausea Only, Rash   Clopidogrel Rash   Hydrocodone Rash   Morphine Hives, Nausea Only, Rash   Ciprofloxacin Itching, Rash   Clindamycin/lincomycin Hives, Itching   Codeine Hives, Itching, Rash   Doxycycline Rash   Enalapril Rash   cough   Levofloxacin Hives, Itching   Mobic [meloxicam] Hives, Rash   Oxycodone Rash   Vasotec Cough      Medication List    STOP taking these medications   diphenhydrAMINE 25 mg capsule Commonly known as:  BENADRYL   hydrOXYzine 10 MG tablet Commonly known as:  ATARAX/VISTARIL   insulin lispro 100 UNIT/ML injection Commonly known as:  HUMALOG   insulin pump Soln   metoprolol tartrate 25 MG tablet Commonly known as:  LOPRESSOR     TAKE these medications   acetaminophen 500 MG tablet Commonly known as:  TYLENOL Take 500 mg by mouth daily as needed for pain.   albuterol 108 (90 Base) MCG/ACT inhaler Commonly known as:  PROVENTIL HFA;VENTOLIN HFA Inhale 2 puffs into the lungs every 6 (six) hours as needed for wheezing or shortness of breath.   allopurinol 100 MG tablet Commonly known as:  ZYLOPRIM Take 200 mg by mouth daily.   aspirin EC 81 MG tablet Take 81 mg by mouth at bedtime.   atorvastatin 80 MG tablet Commonly known as:  LIPITOR Take 1 tablet (80 mg total) by mouth daily at 6 PM.   calcium acetate 667 MG capsule Commonly known as:  PHOSLO Take 667 mg by mouth See admin instructions. Take 1 capsule  by mouth with meals and 1 with snacks   docusate sodium 100 MG capsule Commonly known as:  COLACE Take 100 mg by mouth 3 (three) times daily.   feeding supplement (NEPRO CARB STEADY) Liqd Take 237 mLs by mouth daily.   fenofibrate 48 MG tablet Commonly known as:  TRICOR Take 1 tablet (48 mg total) by mouth daily.   fluticasone 50 MCG/ACT nasal spray Commonly known as:  FLONASE Place 1 spray into both nostrils daily as needed for allergies.   gabapentin 300 MG capsule Commonly known as:  NEURONTIN Take 300 mg by mouth 3 (three) times daily.   GLUCAGON EMERGENCY 1 MG injection Generic drug:  glucagon Inject 1 mg into the muscle daily as needed (low blood sugar).   HYDROmorphone 2 MG tablet Commonly known as:  DILAUDID Take 0.5 tablets (1 mg total) by mouth 3 (three) times daily as  needed for severe pain. What changed:  how much to take   insulin aspart 100 UNIT/ML injection Commonly known as:  novoLOG Inject 0-15 Units into the skin 3 (three) times daily with meals.   insulin glargine 100 UNIT/ML injection Commonly known as:  LANTUS Inject 0.26 mLs (26 Units total) into the skin at bedtime.   levothyroxine 112 MCG tablet Commonly known as:  SYNTHROID, LEVOTHROID Take 112 mcg by mouth at bedtime.   loratadine 10 MG tablet Commonly known as:  CLARITIN Take 10 mg by mouth daily.   metoCLOPramide 5 MG tablet Commonly known as:  REGLAN Take 2 tablets (10 mg total) by mouth 4 (four) times daily - after meals and at bedtime.   midodrine 10 MG tablet Commonly known as:  PROAMATINE Take 1 tablet (10 mg total) by mouth 3 (three) times daily. Takes 1 tab in mornings of dialysis and takes 2 tabs 30 mins before starting dialysis (m,w,f) What changed:  how much to take  when to take this   multivitamin Tabs tablet Take 1 tablet by mouth daily.   nitroGLYCERIN 0.4 MG SL tablet Commonly known as:  NITROSTAT Place 1 tablet (0.4 mg total) under the tongue every 5 (five) minutes as needed for chest pain.   omeprazole 40 MG capsule Commonly known as:  PRILOSEC Take 40 mg by mouth 2 (two) times daily.   OXYGEN Inhale 3 L into the lungs at bedtime.   polyethylene glycol packet Commonly known as:  MIRALAX / GLYCOLAX Take 17 g by mouth daily. What changed:  when to take this  reasons to take this   prasugrel 10 MG Tabs tablet Commonly known as:  EFFIENT Take 1 tablet (10 mg total) by mouth daily.   promethazine 25 MG tablet Commonly known as:  PHENERGAN Take 1 tablet (25 mg total) by mouth every 6 (six) hours as needed for nausea or vomiting. For nausea   rOPINIRole 2 MG tablet Commonly known as:  REQUIP Take 2 mg by mouth daily with supper.   SYSTANE 0.4-0.3 % Soln Generic drug:  Polyethyl Glycol-Propyl Glycol Place 1 drop into both eyes as needed  (dry eyes).

## 2016-04-14 NOTE — Progress Notes (Signed)
[facility] Renal Consultation Note    Indication for Consultation:  Management of ESRD/hemodialysis, anemia, hypertension/volume, and secondary hyperparathyroidism. PCP:  HPI: Dale [name] is a 50 y.o. male with ESRD, CAD s/p recent DES 12/2015, fibromyalgia, DM, hypothyroidism who was admitted for symptomatic hypotension after dialysis on 12/20. Has been dizzy and hypotensive off-and-on at home for the past week. No CP or dyspnea. No cough, congestion, fever, chills, abdominal pains or signs of infection. No N/V/D. He had been taking metoprolol BID at home, but had stopped this prior to coming in. He typically uses midodrine prior to HD.Has been taking up to 30 mg during a treatment for BP support.  Finished prednisone taper about 2 weeks ago. No other new medications. He does use dilaudid and gabapentin for neuropathic pain.  At [facility], labs were all stable except high glucose. No EKG changes. BCx collected, negative so far. CXR with cardiomegaly but no fluid or pneumonia. He received 543mL IVF bolus and then another 231mL.  Last dialyzed 12/20 at [facility]. No recent AVF issues. Leaving well above EDW every treatment. Having symptomatic hypotension on non-dialyisis days as well as at dialysis.   Past Medical History:  Diagnosis Date  . Anemia of chronic disease   . Arthritis    "hands, right knee" (03/19/2014)  . CAD (coronary artery disease)    a. Abnl nuc ->LHC (11/15):  mid to dist Dx 80%, mid RCA 99% (functional CTO), dist RCA 80% >> PCI: balloon angioplasty to mid RCA (could not deliver stent) - staged PCI Rotoblator atherectomy/DES to [facility]. b. NSTEMI 12/2015 s/p DES to diagonal.  . Cholecystitis    a. 08/27/2011  . Chronic diastolic CHF (congestive heart failure) ([place])    a. Echo 3/13:  EF 55-60%;  b. Echo (11/15):  Mild LVH, EF 60-65%, no RWMA, Gr 1 DD, MAC, mild LAE, normal RVF, mild RAE;  c.  Echo 5/16:  severe LVH, EF 55-60%, no RWMA, Gr 1 DD, MAC, mild LAE  .  Claustrophobia    when things get around his face.   . Difficult intubation    only once in 2015 at [facility] haven't had a problem since then  . ESRD (end stage renal disease) ([place])    a. 1995 s/p cadaveric transplant w/ susbequent failure after 18 yrs;  b.Dialysis initiated 07/2011 [facility] M-W-F  . Fibromyalgia   . Gastroparesis   . GERD (gastroesophageal reflux disease)   . Gout    PMH  . History of blood transfusion   . HLD (hyperlipidemia)   . Hypertension   . Hypothyroidism   . Inguinal lymphadenopathy    a. bilateral - s/p biopsy 07/2011  . Insulin dependent diabetes mellitus (HCC)    Type I  . Morbid obesity ([place])   . OSA (obstructive sleep apnea)    adjustable bed  . PAD (peripheral artery disease) (HCC)    a. s/p L BKA.  [name] Kitchen Pericardial effusion    a.  Small by CT 08/22/11;  b.  Large by CT 08/27/11. c. Not seen on 11/2015 echo.  . Pneumonia ~ 2007  . Tracheobronchomalacia    Past Surgical History:  Procedure Laterality Date  . AMPUTATION OF REPLICATED TOES Right   . ANGIOPLASTY  04/09/2014   RCA   . APPENDECTOMY  ~ 2006  . ARTERIOVENOUS GRAFT PLACEMENT Left 1993?   forearm  . ARTERIOVENOUS GRAFT PLACEMENT Right 1993?   leg  . ARTERIOVENOUS GRAFT PLACEMENT Left  Thigh  . Arteriovenous Graft Removed Left    Thigh  . AV FISTULA PLACEMENT  07/29/2011   Procedure: ARTERIOVENOUS (AV) FISTULA CREATION;  Surgeon: Mal Misty, MD;  Location: [place];  Service: Vascular;  Laterality: Right;  Brachial cephalic  . AV FISTULA PLACEMENT Left 01/20/2015   Procedure: LIGATION OF RIGHT ARTERIOVENOUS FISTULA WITH EXCISION OF ANEURYSM;  Surgeon: Angelia Mould, MD;  Location: [place];  Service: Vascular;  Laterality: Left;  . AV FISTULA PLACEMENT Left 04/30/2015   Procedure: CREATION OF LEFT ARM BRACHIO-CEPHALIC ARTERIOVENOUS (AV) FISTULA ;  Surgeon: Angelia Mould, MD;  Location: [place];  Service: Vascular;  Laterality: Left;  . AV Graft PLACEMENT Left 1993?    "attempted one in my wrist; didn't take"  . BELOW KNEE LEG AMPUTATION Left 2010  . CARDIAC CATHETERIZATION  03/19/2014  . CARDIAC CATHETERIZATION  03/19/2014   Procedure: CORONARY BALLOON ANGIOPLASTY;  Surgeon: Burnell Blanks, MD;  Location: [facility] CATH LAB;  Service: Cardiovascular;;  . CARDIAC CATHETERIZATION N/A 01/01/2016   Procedure: Left Heart Cath and Coronary Angiography;  Surgeon: Troy Sine, MD;  Location: [place] CV LAB;  Service: Cardiovascular;  Laterality: N/A;  . CARDIAC CATHETERIZATION N/A 01/01/2016   Procedure: Coronary Stent Intervention;  Surgeon: Troy Sine, MD;  Location: [place] CV LAB;  Service: Cardiovascular;  Laterality: N/A;  . CARPAL TUNNEL RELEASE Bilateral after 2005  . CATARACT EXTRACTION W/ INTRAOCULAR LENS  IMPLANT, BILATERAL Bilateral 1990's  . COLONOSCOPY  08/29/2011   Procedure: COLONOSCOPY;  Surgeon: Jerene Bears, MD;  Location: [place];  Service: Gastroenterology;  Laterality: N/A;  . CORONARY ANGIOPLASTY WITH STENT PLACEMENT  04/09/2014       ptca/des mid lad   . ESOPHAGOGASTRODUODENOSCOPY N/A 12/25/2015   Procedure: ESOPHAGOGASTRODUODENOSCOPY (EGD);  Surgeon: Mauri Pole, MD;  Location: [facility] ENDOSCOPY;  Service: Endoscopy;  Laterality: N/A;  bedside  . EYE SURGERY Bilateral    cataract  . FEMORAL ARTERY REPAIR  04/09/2014   ANGIOSEAL  . INSERTION OF DIALYSIS CATHETER  08/01/2011   Procedure: INSERTION OF DIALYSIS CATHETER;  Surgeon: Rosetta Posner, MD;  Location: [place];  Service: Vascular;  Laterality: Right;  insertion of dialysis catheter on right internal jugular vein  . INSERTION OF DIALYSIS CATHETER Right 01/20/2015   Procedure: INSERTION OF DIALYSIS CATHETER - RIGHT INTERNAL JUGULAR ;  Surgeon: Angelia Mould, MD;  Location: [place];  Service: Vascular;  Laterality: Right;  . KIDNEY TRANSPLANT  04/25/1993  . LEFT HEART CATHETERIZATION WITH CORONARY ANGIOGRAM N/A 03/19/2014   Procedure: LEFT HEART CATHETERIZATION WITH  CORONARY ANGIOGRAM;  Surgeon: Burnell Blanks, MD;  Location: [facility] CATH LAB;  Service: Cardiovascular;  Laterality: N/A;  . LYMPH NODE BIOPSY  08/10/2011   Procedure: LYMPH NODE BIOPSY;  Surgeon: Merrie Roof, MD;  Location: [place];  Service: General;  Laterality: Left;  left groin lymph biopsy  . NEUROPLASTY / TRANSPOSITION ULNAR NERVE AT ELBOW Left after 2005  . PERCUTANEOUS CORONARY ROTOBLATOR INTERVENTION (PCI-R) N/A 04/09/2014   Procedure: PERCUTANEOUS CORONARY ROTOBLATOR INTERVENTION (PCI-R);  Surgeon: Burnell Blanks, MD;  Location: [facility] CATH LAB;  Service: Cardiovascular;  Laterality: N/A;  . PERIPHERAL VASCULAR CATHETERIZATION N/A 12/25/2014   Procedure: Upper Extremity Venography;  Surgeon: Conrad [place], MD;  Location: [place] CV LAB;  Service: Cardiovascular;  Laterality: N/A;  . PERIPHERAL VASCULAR CATHETERIZATION Left 02/24/2016   Procedure: Fistulagram;  Surgeon: Waynetta Sandy, MD;  Location: [place] CV LAB;  Service: Cardiovascular;  Laterality: Left;  . PERIPHERAL VASCULAR CATHETERIZATION Left 02/24/2016   Procedure: Peripheral Vascular Intervention;  Surgeon: Waynetta Sandy, MD;  Location: [place] CV LAB;  Service: Cardiovascular;  Laterality: Left;  AV FISTULA  . PERITONEAL CATHETER INSERTION    . PERITONEAL CATHETER REMOVAL    . REVISON OF ARTERIOVENOUS FISTULA Right 7/67/2094   Procedure: PLICATION / REVISION OF ARTERIOVENOUS FISTULA;  Surgeon: Angelia Mould, MD;  Location: [place];  Service: Vascular;  Laterality: Right;  . REVISON OF ARTERIOVENOUS FISTULA Left 09/01/2015   Procedure: SUPERFICIALIZATION OF LEFT ARM BRACHIOCEPHALIC ARTERIOVENOUS FISTULA;  Surgeon: Angelia Mould, MD;  Location: [place];  Service: Vascular;  Laterality: Left;  . SHOULDER ARTHROSCOPY Left ~ 2006   frozen  . TOE AMPUTATION Bilateral after 2005  . UMBILICAL HERNIA REPAIR  1990's  . VENOGRAM Right 07/28/2011   Procedure: VENOGRAM;  Surgeon: Conrad [place], MD;  Location: [facility] CATH LAB;  Service: Cardiovascular;  Laterality: Right;  . VITRECTOMY Bilateral    Family History  Problem Relation Age of Onset  . Colon polyps Father   . Diabetes Father   . Hypertension Father   . Hypertension Mother   . Diabetes Mother   . Hyperlipidemia Mother   . Breast cancer Maternal Aunt   . Bone cancer Maternal Aunt   . Liver cancer Maternal Aunt   . Malignant hyperthermia Neg Hx   . Heart attack Neg Hx   . Stroke Neg Hx   . Colon cancer Neg Hx   . Stomach cancer Neg Hx   . Pancreatic cancer Neg Hx   . Esophageal cancer Neg Hx    Social History:  reports that he has never smoked. He has never used smokeless tobacco. He reports that he drinks alcohol. He reports that he does not use drugs.  ROS: As per HPI otherwise negative.  Physical Exam: Vitals:   04/14/16 0438 04/14/16 0616 04/14/16 0743 04/14/16 1245  BP: (!) 88/51 (!) 77/39 (!) 108/52 (!) 105/52  Pulse: 84 85 82 87  Resp: 18 20  18   Temp: 98 F (36.7 C) 97.4 F (36.3 C)  98.2 F (36.8 C)  TempSrc: Oral Oral  Oral  SpO2: 93% 100%  99%  Weight:      Height:         General: Well developed, obese male. NAD. Has been wearing nasal oxygen while sleeping only. Head: Normocephalic, atraumatic, sclera non-icteric, mucus membranes are moist. Neck: Supple without lymphadenopathy/masses. JVD not elevated (hard to tell due to habitus) Lungs: Clear bilaterally to auscultation without wheezes, rales, or rhonchi. Breathing is unlabored. Heart: RRR with normal S1, S2. No murmurs, rubs, or gallops appreciated. Abdomen: Soft, non-tender, non-distended with normoactive bowel sounds. Musculoskeletal:  Strength and tone appear normal for age. Lower extremities: L AKA without stump edema. RLE is tense/woody feeling without pitting edema. Has a small abrasion on R shin.    At time of my exam pt's leg wrapped from just below knee down Neuro: Alert and oriented X 3. Moves all extremities  spontaneously. Psych:  Responds to questions appropriately with a normal affect. Dialysis Access: L AVF + thrill, no signs of infection.  Allergies  Allergen Reactions  . Contrast Media [Iodinated Diagnostic Agents] Rash and Itching    Systemic itching and transient rash appearance within hours of receiving contrast  Systemic itching and transient rash appearance within hours of receiving contrast  Redman Syndrome  . Prednisone Other (See Comments)  Increases sugar levels  . Rocephin [Ceftriaxone] Itching  . Adhesive [Tape] Itching and Other (See Comments)    Blisters on arm from adhesive tape at dialysis   . Amoxicillin Itching and Rash    .Has patient had a PCN reaction causing immediate rash, facial/tongue/throat swelling, SOB or lightheadedness with hypotension: Yes Has patient had a PCN reaction causing severe rash involving mucus membranes or skin necrosis: No Has patient had a PCN reaction that required hospitalization No Has patient had a PCN reaction occurring within the last 10 years: No If all of the above answers are "NO", then may proceed with Cephalosporin use.  Kary Kos [Ticagrelor] Nausea And Vomiting  . Buprenorphine Hcl Hives, Nausea Only and Rash  . Clopidogrel Rash  . Hydrocodone Rash  . Morphine Hives, Nausea Only and Rash  . Ciprofloxacin Itching and Rash  . Clindamycin/Lincomycin Hives and Itching  . Codeine Hives, Itching and Rash  . Doxycycline Rash  . Enalapril Rash    cough  . Levofloxacin Hives and Itching  . Mobic [Meloxicam] Hives and Rash  . Oxycodone Rash  . Vasotec Cough   Prior to Admission medications   Medication Sig Start Date End Date Taking? Authorizing Provider  acetaminophen (TYLENOL) 500 MG tablet Take 500 mg by mouth daily as needed for pain. 01/16/16  Yes Historical Provider, MD  albuterol (PROVENTIL HFA;VENTOLIN HFA) 108 (90 Base) MCG/ACT inhaler Inhale 2 puffs into the lungs every 6 (six) hours as needed for wheezing or  shortness of breath. 01/26/16  Yes Brand Males, MD  allopurinol (ZYLOPRIM) 100 MG tablet Take 200 mg by mouth daily.  03/14/12  Yes Historical Provider, MD  aspirin EC 81 MG tablet Take 81 mg by mouth at bedtime.   Yes Historical Provider, MD  atorvastatin (LIPITOR) 80 MG tablet Take 1 tablet (80 mg total) by mouth daily at 6 PM. 01/04/16  Yes Florencia Reasons, MD  calcium acetate (PHOSLO) 667 MG capsule Take 667 mg by mouth See admin instructions. Take 1 capsule  by mouth with meals and 1 with snacks 09/19/12  Yes Historical Provider, MD  diphenhydrAMINE (BENADRYL) 25 mg capsule Take 1-2 capsules (25-50 mg total) by mouth every 6 (six) hours as needed for itching. Patient taking differently: Take 50 mg by mouth 2 (two) times daily as needed for itching or sleep.  04/11/14  Yes Rhonda G Barrett, PA-C  docusate sodium (COLACE) 100 MG capsule Take 100 mg by mouth 3 (three) times daily.   Yes Historical Provider, MD  fenofibrate (TRICOR) 48 MG tablet Take 1 tablet (48 mg total) by mouth daily. 02/27/15  Yes Shanker Kristeen Mans, MD  fluticasone (FLONASE) 50 MCG/ACT nasal spray Place 1 spray into both nostrils daily as needed for allergies.    Yes Historical Provider, MD  gabapentin (NEURONTIN) 300 MG capsule Take 300 mg by mouth 3 (three) times daily.   Yes Historical Provider, MD  GLUCAGON EMERGENCY 1 MG injection Inject 1 mg into the muscle daily as needed (low blood sugar).  01/19/12  Yes Historical Provider, MD  HYDROmorphone (DILAUDID) 2 MG tablet Take 1 tablet (2 mg total) by mouth 3 (three) times daily as needed for severe pain. Patient taking differently: Take 2 mg by mouth See admin instructions. Take 1 tablet (2 mg) by mouth daily at bedtime, may take an additional dose during the day as needed for severe pain 11/20/14  Yes Alvia Grove, PA-C  hydrOXYzine (ATARAX/VISTARIL) 10 MG tablet Take 10 mg by mouth 3 (  three) times daily as needed for itching. 04/05/16  Yes Historical Provider, MD  Insulin Human  (INSULIN PUMP) 100 unit/ml SOLN Inject 1 each into the skin continuous. Humalog   Yes Historical Provider, MD  insulin lispro (HUMALOG) 100 UNIT/ML injection Inject 1-175 Units into the skin continuous. Up to 175 units continuous in pump,  Basal rates area as follows:  2400 1.5 units/hr, 0300 1.9 units/hr, 0800 1.45 units/hr, 1200 1.625 units/hr   Yes Historical Provider, MD  levothyroxine (SYNTHROID, LEVOTHROID) 112 MCG tablet Take 112 mcg by mouth at bedtime.   Yes Historical Provider, MD  loratadine (CLARITIN) 10 MG tablet Take 10 mg by mouth daily.   Yes Historical Provider, MD  metoCLOPramide (REGLAN) 5 MG tablet Take 2 tablets (10 mg total) by mouth 4 (four) times daily - after meals and at bedtime. 01/21/16  Yes Velvet Bathe, MD  midodrine (PROAMATINE) 10 MG tablet Take 30 mg by mouth See admin instructions. Takes 1 tab in mornings of dialysis and takes 2 tabs 30 mins before starting dialysis (m,w,f)   Yes Historical Provider, MD  multivitamin (RENA-VIT) TABS tablet Take 1 tablet by mouth daily.   Yes Historical Provider, MD  nitroGLYCERIN (NITROSTAT) 0.4 MG SL tablet Place 1 tablet (0.4 mg total) under the tongue every 5 (five) minutes as needed for chest pain. 02/15/16  Yes Burnell Blanks, MD  Nutritional Supplements (FEEDING SUPPLEMENT, NEPRO CARB STEADY,) LIQD Take 237 mLs by mouth daily. 01/04/16  Yes Florencia Reasons, MD  omeprazole (PRILOSEC) 40 MG capsule Take 40 mg by mouth 2 (two) times daily.   Yes Historical Provider, MD  OXYGEN Inhale 3 L into the lungs at bedtime.   Yes Historical Provider, MD  Polyethyl Glycol-Propyl Glycol (SYSTANE) 0.4-0.3 % SOLN Place 1 drop into both eyes as needed (dry eyes).   Yes Historical Provider, MD  polyethylene glycol (MIRALAX / GLYCOLAX) packet Take 17 g by mouth daily. Patient taking differently: Take 17 g by mouth daily as needed for mild constipation.  02/06/16  Yes Belkys A Regalado, MD  prasugrel (EFFIENT) 10 MG TABS tablet Take 1 tablet (10 mg  total) by mouth daily. 01/28/16 04/27/16 Yes Eileen Stanford, PA-C  promethazine (PHENERGAN) 25 MG tablet Take 1 tablet (25 mg total) by mouth every 6 (six) hours as needed for nausea or vomiting. For nausea 11/27/14  Yes Liliane Shi, PA-C  rOPINIRole (REQUIP) 2 MG tablet Take 2 mg by mouth daily with supper.    Yes Historical Provider, MD  metoprolol tartrate (LOPRESSOR) 25 MG tablet Take 1 tablet (25 mg total) by mouth 2 (two) times daily. Patient taking differently: Take 12.5 mg by mouth 2 (two) times daily.  01/28/16   Eileen Stanford, PA-C   Current Facility-Administered Medications  Medication Dose Route Frequency Provider Last Rate Last Dose  . acetaminophen (TYLENOL) tablet 500 mg  500 mg Oral Q6H PRN Silver Huguenin Elgergawy, MD      . albuterol (PROVENTIL) (2.5 MG/3ML) 0.083% nebulizer solution 2.5 mg  2.5 mg Nebulization Q2H PRN Albertine Patricia, MD      . allopurinol (ZYLOPRIM) tablet 200 mg  200 mg Oral Daily Albertine Patricia, MD   200 mg at 04/14/16 0843  . aspirin EC tablet 81 mg  81 mg Oral QHS Albertine Patricia, MD   81 mg at 04/13/16 2153  . atorvastatin (LIPITOR) tablet 80 mg  80 mg Oral q1800 Albertine Patricia, MD      . bisacodyl (  DULCOLAX) suppository 10 mg  10 mg Rectal Daily PRN Albertine Patricia, MD      . calcium acetate (PHOSLO) capsule 667 mg  667 mg Oral TID WC Albertine Patricia, MD   667 mg at 04/14/16 1308  . dextrose 50 % solution 25 g  25 g Intravenous PRN Albertine Patricia, MD   50 mL at 04/14/16 0818  . docusate sodium (COLACE) capsule 100 mg  100 mg Oral TID Albertine Patricia, MD   100 mg at 04/14/16 0842  . feeding supplement (NEPRO CARB STEADY) liquid 237 mL  237 mL Oral Q24H Albertine Patricia, MD   237 mL at 04/14/16 0844  . fenofibrate tablet 54 mg  54 mg Oral Daily Albertine Patricia, MD   54 mg at 04/14/16 0843  . fluticasone (FLONASE) 50 MCG/ACT nasal spray 1 spray  1 spray Each Nare Daily PRN Albertine Patricia, MD      . heparin injection 5,000  Units  5,000 Units Subcutaneous Q8H Albertine Patricia, MD   5,000 Units at 04/14/16 1308  . HYDROmorphone (DILAUDID) tablet 1 mg  1 mg Oral TID PRN Albertine Patricia, MD      . insulin aspart (novoLOG) injection 0-15 Units  0-15 Units Subcutaneous TID WC Albertine Patricia, MD   15 Units at 04/14/16 1308  . insulin glargine (LANTUS) injection 30 Units  30 Units Subcutaneous QHS Albertine Patricia, MD   30 Units at 04/13/16 2207  . levothyroxine (SYNTHROID, LEVOTHROID) tablet 112 mcg  112 mcg Oral QHS Albertine Patricia, MD   112 mcg at 04/14/16 0036  . loratadine (CLARITIN) tablet 10 mg  10 mg Oral Daily Albertine Patricia, MD   10 mg at 04/14/16 0843  . metoCLOPramide (REGLAN) tablet 10 mg  10 mg Oral TID PC & HS Albertine Patricia, MD   10 mg at 04/14/16 1308  . [START ON 04/15/2016] midodrine (PROAMATINE) tablet 10 mg  10 mg Oral Q M,W,F Dawood S Elgergawy, MD      . multivitamin (RENA-VIT) tablet 1 tablet  1 tablet Oral Daily Albertine Patricia, MD   1 tablet at 04/14/16 [phone]  . mupirocin ointment (BACTROBAN) 2 % 1 application  1 application Nasal BID Albertine Patricia, MD   1 application at 33/82/50 [phone]  . nitroGLYCERIN (NITROSTAT) SL tablet 0.4 mg  0.4 mg Sublingual Q5 min PRN Albertine Patricia, MD      . pantoprazole (PROTONIX) EC tablet 40 mg  40 mg Oral Daily Albertine Patricia, MD   40 mg at 04/14/16 0843  . polyethylene glycol (MIRALAX / GLYCOLAX) packet 17 g  17 g Oral Daily PRN Albertine Patricia, MD      . polyvinyl alcohol (LIQUIFILM TEARS) 1.4 % ophthalmic solution 1 drop  1 drop Both Eyes PRN Albertine Patricia, MD      . prasugrel (EFFIENT) tablet 10 mg  10 mg Oral Daily Albertine Patricia, MD   10 mg at 04/14/16 0843  . promethazine (PHENERGAN) tablet 25 mg  25 mg Oral Q6H PRN Albertine Patricia, MD      . rOPINIRole (REQUIP) tablet 2 mg  2 mg Oral Q supper Silver Huguenin Elgergawy, MD      . sodium chloride 0.9 % bolus 250 mL  250 mL Intravenous Once Gardiner Barefoot, NP        Facility-Administered Medications Ordered in Other Encounters  Medication Dose Route Frequency  Provider Last Rate Last Dose  . 0.9 %  sodium chloride infusion   Intravenous Continuous Angelia Mould, MD      . Chlorhexidine Gluconate Cloth 2 % PADS 6 each  6 each Topical Once Angelia Mould, MD       Labs: Basic Metabolic Panel:  Recent Labs Lab 04/13/16 1458  NA 135  K 4.0  CL 93*  CO2 28  GLUCOSE 344*  BUN 22*  CREATININE 5.61*  CALCIUM 9.0   CBC:  Recent Labs Lab 04/13/16 1458  WBC 7.4  NEUTROABS 4.4  HGB 11.6*  HCT 39.7  MCV 100.8*  PLT 272   Cardiac Enzymes:  Recent Labs Lab 04/13/16 1458  TROPONINI <0.03   CBG:  Recent Labs Lab 04/13/16 2206 04/14/16 0003 04/14/16 0805 04/14/16 0831 04/14/16 1242  GLUCAP 309* 273* 45* 152* 370*   Studies/Results: Dg Chest 2 View  Result Date: 04/13/2016 CLINICAL DATA:  Hypertension and hypoxia following dialysis today. EXAM: CHEST  2 VIEW COMPARISON:  One-view chest x-ray 02/25/2016 FINDINGS: The heart is mildly enlarged. There is no edema or effusion. No focal airspace disease is present. The visualized soft tissues and bony thorax are unremarkable. IMPRESSION: Cardiomegaly without failure. Next item no acute cardiopulmonary disease. Electronically Signed   By: San Morelle M.D.   On: 04/13/2016 16:51    Dialysis Orders:  HP unit on MWF schedule  Assessment/Plan: 1.  Hypotension: Symptomatic on admit, now feels better. BP was very low this AM, now improved slightly although remains on low side. No signs of infection thus far. CXR without evidence of volume overload. His hypotension is not entirely new for him, he has required midodrine with HD for a while. His metoprolol has been stopped here which is appropriate. His other sedating/possibly BP dropping meds have been cut back. Further work-up could include checking cortisol, TSH, and consideration of repeat Echo. This does not necessarily  all need to happen as an inpatient if he feels better and wants to go home. He is followed closely with cardiology, next appt in January 2018. I do believe his dry weight should be raised, it is a question of how much. His dry weight currently is 118.5kg. His last post-HD weight on 12/20 prior to come here was 121.2kg. He does not have edema today (which is rare for him). Will weigh him today. We can try a new EDW of 122kg ? and follow him very closely. Would also recommend to increase midodrine dosing to TID everyday for now. 2.  ESRD: Continue HD per MWF schedule. 3.  Anemia: Hgb 11.6. No ESA needed. 4.  Metabolic bone disease: Ca ok, Continue binders. 5.  DM: Per primary.  Veneta Penton, PA-C 04/14/2016, 2:16 PM  [facility] Pager: [phone]  I have seen and examined this patient and agree with plan and assessment in the above note with renal recommendations/intervention highlighted. Has been having hypotension with HD and on non-HD days. Uses up to 30 mg midodrine during HD for BP. No extra volume on exam. I think EDW may just be too low.  Would favor changing midodrine to TID every day (not just at HD) and will raise EDW. If there are CONTINUED issues as an out pt with low BP, THEN could do further workup as described above.  OK with me if pt discharged to home today.   Kynzley Dowson B,MD 04/14/2016 3:04 PM

## 2016-04-14 NOTE — Progress Notes (Signed)
NURSING PROGRESS NOTE  Dale [name] [id number] Admission Data: 04/14/2016 1:21 AM Attending Provider: Albertine Patricia, MD WTU:[id number], MD Code Status: FULL  Allergies:  Contrast media [iodinated diagnostic agents]; Prednisone; Rocephin [ceftriaxone]; Adhesive [tape]; Amoxicillin; Brilinta [ticagrelor]; Buprenorphine hcl; Clopidogrel; Hydrocodone; Morphine; Ciprofloxacin; Clindamycin/lincomycin; Codeine; Doxycycline; Enalapril; Levofloxacin; Mobic [meloxicam]; Oxycodone; and Vasotec Past Medical History:   has a past medical history of Anemia of chronic disease; Arthritis; CAD (coronary artery disease); Cholecystitis; Chronic diastolic CHF (congestive heart failure) ([place]); Claustrophobia; Difficult intubation; ESRD (end stage renal disease) ([place]); Fibromyalgia; Gastroparesis; GERD (gastroesophageal reflux disease); Gout; History of blood transfusion; HLD (hyperlipidemia); Hypertension; Hypothyroidism; Inguinal lymphadenopathy; Insulin dependent diabetes mellitus ([place]); Morbid obesity ([place]); OSA (obstructive sleep apnea); PAD (peripheral artery disease) ([place]); Pericardial effusion; Pneumonia (~ 2007); and Tracheobronchomalacia. Past Surgical History:   has a past surgical history that includes Kidney transplant (04/25/1993); Cataract extraction w/ intraocular lens  implant, bilateral (Bilateral, 1990's); Arteriovenous graft placement (Left, 1993?); Arteriovenous graft placement (Right, 1993?); Toe amputation (Bilateral, after 2005); Below knee leg amputation (Left, 2010); Carpal tunnel release (Bilateral, after 2005); Shoulder arthroscopy (Left, ~ 2006); AV fistula placement (07/29/2011); Insertion of dialysis catheter (08/01/2011); Lymph node biopsy (08/10/2011); Colonoscopy (08/29/2011); Appendectomy (~ 2006); Umbilical hernia repair (1990's); Cardiac catheterization (03/19/2014); AV Graft PLACEMENT (Left, 1993?); Neuroplasty / transposition ulnar nerve at elbow (Left, after 2005); venogram (Right,  07/28/2011); left heart catheterization with coronary angiogram (N/A, 03/19/2014); Cardiac catheterization (03/19/2014); Coronary angioplasty with stent (04/09/2014    ); Angioplasty (04/09/2014); Femoral artery repair (04/09/2014); percutaneous coronary rotoblator intervention (pci-r) (N/A, 04/09/2014); Revison of arteriovenous fistula (Right, 11/20/2014); Cardiac catheterization (N/A, 12/25/2014); Eye surgery (Bilateral); Amputation of replicated toes (Right); Vitrectomy (Bilateral); Peritoneal catheter insertion; Peritoneal catheter removal; Arteriovenous graft placement (Left); Arteriovenous Graft Removed (Left); AV fistula placement (Left, 01/20/2015); Insertion of dialysis catheter (Right, 01/20/2015); AV fistula placement (Left, 04/30/2015); Revison of arteriovenous fistula (Left, 09/01/2015); Esophagogastroduodenoscopy (N/A, 12/25/2015); Cardiac catheterization (N/A, 01/01/2016); Cardiac catheterization (N/A, 01/01/2016); Cardiac catheterization (Left, 02/24/2016); and Cardiac catheterization (Left, 02/24/2016). Social History:   reports that he has never smoked. He has never used smokeless tobacco. He reports that he drinks alcohol. He reports that he does not use drugs.  Dale [name] is a 50 y.o. male patient admitted from ED:   Last Documented Vital Signs: Temp 97.5 oral; BP 109/53; Pulse 92; resp 18; spO2 100 on 2L [place]  IV Fluids:  IV in place, occlusive dsg intact without redness, IV cath wrist right, condition patent and no redness none.   Skin: Appropriate for ethnicity, with abrasion to abdominal skin fold and left forearm, old blister to right leg, pealing skin on right foot and left BKA stump.  Small abrasion on left BKA stump.  Patient orientated to room. Information packet given to patient. Admission inpatient armband information verified with patient to include name and date of birth and placed on patient arm. Side rails up x 2, fall assessment and education completed with patient. Patient able to  verbalize understanding of risk associated with falls and verbalized understanding to call for assistance before getting out of bed. Call light within reach. Patient able to voice and demonstrate understanding of unit orientation instructions.    Will continue to evaluate and treat per MD orders.  Clydell Hakim RN, BSN

## 2016-04-14 NOTE — Progress Notes (Signed)
Orthopedic Tech Progress Note Patient Details:  Dale [name] 1966/02/24 [id number]  Ortho Devices Type of Ortho Device: Ace wrap, Unna boot Ortho Device/Splint Interventions: Application   [name] 04/14/2016, 2:56 PM

## 2016-04-14 NOTE — Progress Notes (Signed)
NP Baltazar Najjar placed order for 250 ml bolus.  Patient refusing bolus due to the amount of fluid he already received from previous bolus.  NP notified of patient's decision.  Will continue to monitor patient and notify as needed.

## 2016-04-14 NOTE — Progress Notes (Signed)
Patient's BP 77/39 On-call NP Baltazar Najjar notified for this BP as well as patient stateing that it feels like all of his energy is being drained.  Will await NP orders and monitor patient.

## 2016-04-14 NOTE — Progress Notes (Signed)
RN called requested to place pt on radar list for SBP 70-80's. Baltazar Najjar NP contacted and orders completed. No interventions from this RN. Will place on radar list

## 2016-04-14 NOTE — Progress Notes (Signed)
Inpatient Diabetes Program Recommendations  AACE/ADA: New Consensus Statement on Inpatient Glycemic Control (2015)  Target Ranges:  Prepandial:   less than 140 mg/dL      Peak postprandial:   less than 180 mg/dL (1-2 hours)      Critically ill patients:  140 - 180 mg/dL   Review of Glycemic Control  Diabetes history: DM 1 Outpatient Diabetes medications: Insulin pump Humalog Current orders for Inpatient glycemic control: Lantus 30, Novolog Moderate TID  Inpatient Diabetes Program Recommendations:   Per pump settings patient receives total daily dose of 37.2 units of basal. Patient was placed on Lantus 30 units. Patient had hypoglycemia on the 40's this am. Patient is very sensitive to insulin consider decreasing Lantus to 22-24 units, decrease correction to Novolog Sensitive TID + HS scale. May need to add meal coverage eventually.   Thanks,  Tama Headings RN, MSN, [facility] Inpatient Diabetes Coordinator Team Pager [phone] (8a-5p)

## 2016-04-14 NOTE — Discharge Instructions (Signed)
Follow with Primary MD Dale [name],ALEHEGN, MD in 7 days   Get CBC, CMP, checked  by Primary MD next visit.    Activity: As tolerated with Full fall precautions use walker/cane & assistance as needed   Disposition Home    Diet: renal cab modified with fluid restriction , with feeding assistance and aspiration precautions.     On your next visit with your primary care physician please Get Medicines reviewed and adjusted.   Please request your Prim.MD to go over all Hospital Tests and Procedure/Radiological results at the follow up, please get all Hospital records sent to your Prim MD by signing hospital release before you go home.   If you experience worsening of your admission symptoms, develop shortness of breath, life threatening emergency, suicidal or homicidal thoughts you must seek medical attention immediately by calling 911 or calling your MD immediately  if symptoms less severe.  You Must read complete instructions/literature along with all the possible adverse reactions/side effects for all the Medicines you take and that have been prescribed to you. Take any new Medicines after you have completely understood and accpet all the possible adverse reactions/side effects.   Do not drive, operating heavy machinery, perform activities at heights, swimming or participation in water activities or provide baby sitting services if your were admitted for syncope or siezures until you have seen by Primary MD or a Neurologist and advised to do so again.  Do not drive when taking Pain medications.    Do not take more than prescribed Pain, Sleep and Anxiety Medications  Special Instructions: If you have smoked or chewed Tobacco  in the last 2 yrs please stop smoking, stop any regular Alcohol  and or any Recreational drug use.  Wear Seat belts while driving.   Please note  You were cared for by a hospitalist during your hospital stay. If you have any questions about your discharge  medications or the care you received while you were in the hospital after you are discharged, you can call the unit and asked to speak with the hospitalist on call if the hospitalist that took care of you is not available. Once you are discharged, your primary care physician will handle any further medical issues. Please note that NO REFILLS for any discharge medications will be authorized once you are discharged, as it is imperative that you return to your primary care physician (or establish a relationship with a primary care physician if you do not have one) for your aftercare needs so that they can reassess your need for medications and monitor your lab values.

## 2016-04-14 NOTE — Consult Note (Signed)
[place] Nurse wound consult note Reason for Consult: unna boot orders Wound type: healing partial thickness right pre tibial, 3.5cm x 2.5 cm, x 0.1cm,  pink bed, no drainage, no odor                      Two stage II supra pubic (pressure from knot and tie On shorts) proximal one 1cm x 1.5cm x 0.1cm distal one is 1.5cm x 1cm x 0.1cm,  no drainage, no odor Pressure Ulcer POA: Yes Measurement:see above Wound bed:see above Drainage (amount, consistency, odor) see above Periwound: intact Dressing procedure/placement/frequency: Rolena Infante ordered for Right leg, Ortho tech to apply, I have provided nurses with orders: To wound in supra pubic area, cleanse with NS, pat dry, apply xeroform, cover with foam dressing, change daily. We will not follow, but will remain available to this patient, to nursing, and the medical and/or surgical teams.  Please re-consult if we need to assist further.      Fara Olden, RN-C, WTA-C Wound Treatment Associate

## 2016-04-14 NOTE — Progress Notes (Signed)
Post Bolus BP 86/40. On-call NP Baltazar Najjar notified of post-bolus BP.  Will continue to monitor and notify NP as needed.

## 2016-04-17 DIAGNOSIS — G4733 Obstructive sleep apnea (adult) (pediatric): Secondary | ICD-10-CM | POA: Diagnosis not present

## 2016-04-17 DIAGNOSIS — I5032 Chronic diastolic (congestive) heart failure: Secondary | ICD-10-CM | POA: Diagnosis not present

## 2016-04-17 DIAGNOSIS — R0602 Shortness of breath: Secondary | ICD-10-CM | POA: Diagnosis not present

## 2016-04-17 DIAGNOSIS — I509 Heart failure, unspecified: Secondary | ICD-10-CM | POA: Diagnosis not present

## 2016-04-17 DIAGNOSIS — Z89519 Acquired absence of unspecified leg below knee: Secondary | ICD-10-CM | POA: Diagnosis not present

## 2016-04-18 LAB — CULTURE, BLOOD (ROUTINE X 2)
Culture: NO GROWTH
Culture: NO GROWTH

## 2016-04-21 ENCOUNTER — Encounter (HOSPITAL_COMMUNITY): Payer: Self-pay

## 2016-04-22 DIAGNOSIS — Z89512 Acquired absence of left leg below knee: Secondary | ICD-10-CM | POA: Diagnosis not present

## 2016-04-24 DIAGNOSIS — Z992 Dependence on renal dialysis: Secondary | ICD-10-CM | POA: Diagnosis not present

## 2016-04-24 DIAGNOSIS — T8612 Kidney transplant failure: Secondary | ICD-10-CM | POA: Diagnosis not present

## 2016-04-24 DIAGNOSIS — N186 End stage renal disease: Secondary | ICD-10-CM | POA: Diagnosis not present

## 2016-04-26 ENCOUNTER — Encounter (HOSPITAL_COMMUNITY): Payer: Self-pay | Admitting: *Deleted

## 2016-04-26 ENCOUNTER — Ambulatory Visit (HOSPITAL_COMMUNITY): Payer: Medicare Other | Admitting: Anesthesiology

## 2016-04-26 ENCOUNTER — Ambulatory Visit (HOSPITAL_COMMUNITY)
Admission: RE | Admit: 2016-04-26 | Discharge: 2016-04-26 | Disposition: A | Discharge status: Home / Self Care | Payer: Medicare Other | Source: Ambulatory Visit | Attending: Internal Medicine | Admitting: Internal Medicine

## 2016-04-26 ENCOUNTER — Encounter (HOSPITAL_COMMUNITY)
Admission: RE | Disposition: A | Discharge status: Home / Self Care | Payer: Self-pay | Source: Ambulatory Visit | Attending: Internal Medicine

## 2016-04-26 DIAGNOSIS — B3781 Candidal esophagitis: Secondary | ICD-10-CM | POA: Insufficient documentation

## 2016-04-26 DIAGNOSIS — E785 Hyperlipidemia, unspecified: Secondary | ICD-10-CM | POA: Insufficient documentation

## 2016-04-26 DIAGNOSIS — E1143 Type 2 diabetes mellitus with diabetic autonomic (poly)neuropathy: Secondary | ICD-10-CM | POA: Diagnosis not present

## 2016-04-26 DIAGNOSIS — I120 Hypertensive chronic kidney disease with stage 5 chronic kidney disease or end stage renal disease: Secondary | ICD-10-CM | POA: Diagnosis not present

## 2016-04-26 DIAGNOSIS — I252 Old myocardial infarction: Secondary | ICD-10-CM | POA: Diagnosis not present

## 2016-04-26 DIAGNOSIS — D509 Iron deficiency anemia, unspecified: Secondary | ICD-10-CM | POA: Insufficient documentation

## 2016-04-26 DIAGNOSIS — E1122 Type 2 diabetes mellitus with diabetic chronic kidney disease: Secondary | ICD-10-CM | POA: Diagnosis not present

## 2016-04-26 DIAGNOSIS — E039 Hypothyroidism, unspecified: Secondary | ICD-10-CM | POA: Insufficient documentation

## 2016-04-26 DIAGNOSIS — D649 Anemia, unspecified: Secondary | ICD-10-CM

## 2016-04-26 DIAGNOSIS — K5730 Diverticulosis of large intestine without perforation or abscess without bleeding: Secondary | ICD-10-CM | POA: Diagnosis not present

## 2016-04-26 DIAGNOSIS — Z89512 Acquired absence of left leg below knee: Secondary | ICD-10-CM | POA: Insufficient documentation

## 2016-04-26 DIAGNOSIS — Z794 Long term (current) use of insulin: Secondary | ICD-10-CM | POA: Diagnosis not present

## 2016-04-26 DIAGNOSIS — I2510 Atherosclerotic heart disease of native coronary artery without angina pectoris: Secondary | ICD-10-CM | POA: Diagnosis not present

## 2016-04-26 DIAGNOSIS — K625 Hemorrhage of anus and rectum: Secondary | ICD-10-CM

## 2016-04-26 DIAGNOSIS — M109 Gout, unspecified: Secondary | ICD-10-CM | POA: Diagnosis not present

## 2016-04-26 DIAGNOSIS — D631 Anemia in chronic kidney disease: Secondary | ICD-10-CM | POA: Diagnosis not present

## 2016-04-26 DIAGNOSIS — I110 Hypertensive heart disease with heart failure: Secondary | ICD-10-CM | POA: Diagnosis not present

## 2016-04-26 DIAGNOSIS — Z89421 Acquired absence of other right toe(s): Secondary | ICD-10-CM | POA: Insufficient documentation

## 2016-04-26 DIAGNOSIS — Z955 Presence of coronary angioplasty implant and graft: Secondary | ICD-10-CM | POA: Insufficient documentation

## 2016-04-26 DIAGNOSIS — R195 Other fecal abnormalities: Secondary | ICD-10-CM | POA: Diagnosis not present

## 2016-04-26 DIAGNOSIS — G4733 Obstructive sleep apnea (adult) (pediatric): Secondary | ICD-10-CM | POA: Insufficient documentation

## 2016-04-26 DIAGNOSIS — K3184 Gastroparesis: Secondary | ICD-10-CM | POA: Diagnosis not present

## 2016-04-26 DIAGNOSIS — K219 Gastro-esophageal reflux disease without esophagitis: Secondary | ICD-10-CM | POA: Insufficient documentation

## 2016-04-26 DIAGNOSIS — Z8371 Family history of colonic polyps: Secondary | ICD-10-CM | POA: Diagnosis not present

## 2016-04-26 DIAGNOSIS — N186 End stage renal disease: Secondary | ICD-10-CM | POA: Insufficient documentation

## 2016-04-26 DIAGNOSIS — Z940 Kidney transplant status: Secondary | ICD-10-CM | POA: Diagnosis not present

## 2016-04-26 DIAGNOSIS — Z992 Dependence on renal dialysis: Secondary | ICD-10-CM | POA: Insufficient documentation

## 2016-04-26 DIAGNOSIS — Z7902 Long term (current) use of antithrombotics/antiplatelets: Secondary | ICD-10-CM | POA: Insufficient documentation

## 2016-04-26 DIAGNOSIS — I509 Heart failure, unspecified: Secondary | ICD-10-CM | POA: Diagnosis not present

## 2016-04-26 HISTORY — DX: Other complications of anesthesia, initial encounter: T88.59XA

## 2016-04-26 HISTORY — PX: ESOPHAGOGASTRODUODENOSCOPY (EGD) WITH PROPOFOL: SHX5813

## 2016-04-26 HISTORY — DX: Adverse effect of unspecified anesthetic, initial encounter: T41.45XA

## 2016-04-26 HISTORY — PX: COLONOSCOPY WITH PROPOFOL: SHX5780

## 2016-04-26 SURGERY — ESOPHAGOGASTRODUODENOSCOPY (EGD) WITH PROPOFOL
Anesthesia: Monitor Anesthesia Care

## 2016-04-26 MED ORDER — LACTATED RINGERS IV SOLN
INTRAVENOUS | Status: DC | PRN
  Administered 2016-04-26: 10:00:00 via INTRAVENOUS

## 2016-04-26 MED ORDER — PROPOFOL 500 MG/50ML IV EMUL
INTRAVENOUS | Status: DC | PRN
  Administered 2016-04-26: 50 mg via INTRAVENOUS

## 2016-04-26 MED ORDER — PHENYLEPHRINE HCL 10 MG/ML IJ SOLN
INTRAMUSCULAR | Status: DC | PRN
  Administered 2016-04-26 (×2): 80 ug via INTRAVENOUS

## 2016-04-26 MED ORDER — PROPOFOL 10 MG/ML IV BOLUS
INTRAVENOUS | Status: AC
  Filled 2016-04-26: qty 20

## 2016-04-26 MED ORDER — PROPOFOL 500 MG/50ML IV EMUL
INTRAVENOUS | Status: DC | PRN
  Administered 2016-04-26: 75 ug/kg/min via INTRAVENOUS

## 2016-04-26 MED ORDER — MIDAZOLAM HCL 5 MG/5ML IJ SOLN
INTRAMUSCULAR | Status: DC | PRN
  Administered 2016-04-26: 2 mg via INTRAVENOUS

## 2016-04-26 MED ORDER — ONDANSETRON HCL 4 MG/2ML IJ SOLN
INTRAMUSCULAR | Status: DC | PRN
  Administered 2016-04-26: 4 mg via INTRAVENOUS

## 2016-04-26 MED ORDER — MIDAZOLAM HCL 2 MG/2ML IJ SOLN
INTRAMUSCULAR | Status: AC
  Filled 2016-04-26: qty 2

## 2016-04-26 MED ORDER — PROPOFOL 10 MG/ML IV BOLUS
INTRAVENOUS | Status: AC
  Filled 2016-04-26: qty 40

## 2016-04-26 MED ORDER — SODIUM CHLORIDE 0.9 % IV SOLN: INTRAVENOUS | Status: DC

## 2016-04-26 SURGICAL SUPPLY — 25 items

## 2016-04-26 NOTE — H&P (Signed)
HPI: [name] is a 51 year old male with a very complicated past medical history including but not limited to end-stage renal disease status post renal transplant now back on hemodialysis, CAD with PCI in September 2017 on Effient, diabetes, option dependent sleep apnea, prior left BKA, history of ischemic colitis, history of gastritis who presents for outpatient upper endoscopy and colonoscopy due to persistently low hemoglobin and concern for chronic GI blood loss. He was seen in the office on 03/24/2016 by Dale Bogus, PA-C after his nephrologist recommended repeat GI evaluation. He has had some minor rectal bleeding with constipation and straining but otherwise seen no hematochezia or melena. He does receive iron supplementation through dialysis.  He had an EGD performed in September 2017 which showed bleeding gastropathy but no high risk lesion. Last colonoscopy was May 2013 when he had changes of ischemic colitis felt secondary to hemodialysis.  He has continued Effient uninterrupted given drug-eluting stent placement in September 2017  Past Medical History:  Diagnosis Date  . Anemia of chronic disease   . Arthritis    "hands, right knee" (03/19/2014) no cartlidge in right knee  . CAD (coronary artery disease)    a. Abnl nuc ->LHC (11/15):  mid to dist Dx 80%, mid RCA 99% (functional CTO), dist RCA 80% >> PCI: balloon angioplasty to mid RCA (could not deliver stent) - staged PCI Rotoblator atherectomy/DES to [facility]. b. NSTEMI 12/2015 s/p DES to diagonal.  . Cholecystitis    a. 08/27/2011  . Chronic diastolic CHF (congestive heart failure) ([place])    a. Echo 3/13:  EF 55-60%;  b. Echo (11/15):  Mild LVH, EF 60-65%, no RWMA, Gr 1 DD, MAC, mild LAE, normal RVF, mild RAE;  c.  Echo 5/16:  severe LVH, EF 55-60%, no RWMA, Gr 1 DD, MAC, mild LAE  . Claustrophobia    when things get around his face.   . Complication of anesthesia   . Difficult intubation    only once in 2015 at [facility] haven't had  a problem since then  . ESRD (end stage renal disease) ([place])    a. 1995 s/p cadaveric transplant w/ susbequent failure after 18 yrs;  b.Dialysis initiated 07/2011 [facility] M-W-F  . Fibromyalgia   . Gastroparesis   . GERD (gastroesophageal reflux disease)   . Gout    PMH  . History of blood transfusion   . HLD (hyperlipidemia)   . Hypertension   . Hypothyroidism   . Inguinal lymphadenopathy    a. bilateral - s/p biopsy 07/2011  . Insulin dependent diabetes mellitus ([facility])    Type I  . Morbid obesity ([place])   . OSA (obstructive sleep apnea)    adjustable bed  . PAD (peripheral artery disease) ([facility])    a. s/p L BKA.  [name] Kitchen Pericardial effusion    a.  Small by CT 08/22/11;  b.  Large by CT 08/27/11. c. Not seen on 11/2015 echo.  . Pneumonia ~ 2007  . PONV (postoperative nausea and vomiting)    vomited once after a procedure.  . Tracheobronchomalacia     Past Surgical History:  Procedure Laterality Date  . AMPUTATION OF REPLICATED TOES Right   . ANGIOPLASTY  04/09/2014   RCA   . APPENDECTOMY  ~ 2006  . ARTERIOVENOUS GRAFT PLACEMENT Left 1993?   forearm  . ARTERIOVENOUS GRAFT PLACEMENT Right 1993?   leg  . ARTERIOVENOUS GRAFT PLACEMENT Left    Thigh  . Arteriovenous Graft Removed Left  Thigh  . AV FISTULA PLACEMENT  07/29/2011   Procedure: ARTERIOVENOUS (AV) FISTULA CREATION;  Surgeon: Dale Misty, MD;  Location: [place];  Service: Vascular;  Laterality: Right;  Brachial cephalic  . AV FISTULA PLACEMENT Left 01/20/2015   Procedure: LIGATION OF RIGHT ARTERIOVENOUS FISTULA WITH EXCISION OF ANEURYSM;  Surgeon: Dale Mould, MD;  Location: [place];  Service: Vascular;  Laterality: Left;  . AV FISTULA PLACEMENT Left 04/30/2015   Procedure: CREATION OF LEFT ARM BRACHIO-CEPHALIC ARTERIOVENOUS (AV) FISTULA ;  Surgeon: Dale Mould, MD;  Location: [place];  Service: Vascular;  Laterality: Left;  . AV Graft PLACEMENT Left 1993?   "attempted one in my wrist; didn't take"  . BELOW  KNEE LEG AMPUTATION Left 2010  . CARDIAC CATHETERIZATION  03/19/2014  . CARDIAC CATHETERIZATION  03/19/2014   Procedure: CORONARY BALLOON ANGIOPLASTY;  Surgeon: Dale Blanks, MD;  Location: [facility] CATH LAB;  Service: Cardiovascular;;  . CARDIAC CATHETERIZATION N/A 01/01/2016   Procedure: Left Heart Cath and Coronary Angiography;  Surgeon: Dale Sine, MD;  Location: [place] CV LAB;  Service: Cardiovascular;  Laterality: N/A;  . CARDIAC CATHETERIZATION N/A 01/01/2016   Procedure: Coronary Stent Intervention;  Surgeon: Dale Sine, MD;  Location: [place] CV LAB;  Service: Cardiovascular;  Laterality: N/A;  . CARPAL TUNNEL RELEASE Bilateral after 2005  . CATARACT EXTRACTION W/ INTRAOCULAR LENS  IMPLANT, BILATERAL Bilateral 1990's  . COLONOSCOPY  08/29/2011   Procedure: COLONOSCOPY;  Surgeon: Dale Bears, MD;  Location: [place];  Service: Gastroenterology;  Laterality: N/A;  . CORONARY ANGIOPLASTY WITH STENT PLACEMENT  04/09/2014       ptca/des mid lad   . ESOPHAGOGASTRODUODENOSCOPY N/A 12/25/2015   Procedure: ESOPHAGOGASTRODUODENOSCOPY (EGD);  Surgeon: Dale Pole, MD;  [facility] ENDOSCOPY;  Service: Endoscopy;  Laterality: N/A;  bedside  . EYE SURGERY Bilateral    cataract  . FEMORAL ARTERY REPAIR  04/09/2014   ANGIOSEAL  . INSERTION OF DIALYSIS CATHETER  08/01/2011   Procedure: INSERTION OF DIALYSIS CATHETER;  Surgeon: Dale Posner, MD;  Location: [place];  Service: Vascular;  Laterality: Right;  insertion of dialysis catheter on right internal jugular vein  . INSERTION OF DIALYSIS CATHETER Right 01/20/2015   Procedure: INSERTION OF DIALYSIS CATHETER - RIGHT INTERNAL JUGULAR ;  Surgeon: Dale Mould, MD;  Location: [place];  Service: Vascular;  Laterality: Right;  . KIDNEY TRANSPLANT  04/25/1993  . LEFT HEART CATHETERIZATION WITH CORONARY ANGIOGRAM N/A 03/19/2014   Procedure: LEFT HEART CATHETERIZATION WITH CORONARY ANGIOGRAM;  Surgeon: Dale Blanks, MD;   Location: [facility] CATH LAB;  Service: Cardiovascular;  Laterality: N/A;  . LYMPH NODE BIOPSY  08/10/2011   Procedure: LYMPH NODE BIOPSY;  Surgeon: Merrie Roof, MD;  Location: [place];  Service: General;  Laterality: Left;  left groin lymph biopsy  . NEUROPLASTY / TRANSPOSITION ULNAR NERVE AT ELBOW Left after 2005  . PERCUTANEOUS CORONARY ROTOBLATOR INTERVENTION (PCI-R) N/A 04/09/2014   Procedure: PERCUTANEOUS CORONARY ROTOBLATOR INTERVENTION (PCI-R);  Surgeon: Dale Blanks, MD;  Location: [facility] CATH LAB;  Service: Cardiovascular;  Laterality: N/A;  . PERIPHERAL VASCULAR CATHETERIZATION N/A 12/25/2014   Procedure: Upper Extremity Venography;  Surgeon: Conrad [place], MD;  Location: [place] CV LAB;  Service: Cardiovascular;  Laterality: N/A;  . PERIPHERAL VASCULAR CATHETERIZATION Left 02/24/2016   Procedure: Fistulagram;  Surgeon: Waynetta Sandy, MD;  Location: [place] CV LAB;  Service: Cardiovascular;  Laterality: Left;  . PERIPHERAL VASCULAR  CATHETERIZATION Left 02/24/2016   Procedure: Peripheral Vascular Intervention;  Surgeon: Waynetta Sandy, MD;  Location: [place] CV LAB;  Service: Cardiovascular;  Laterality: Left;  AV FISTULA  . PERITONEAL CATHETER INSERTION    . PERITONEAL CATHETER REMOVAL    . REVISON OF ARTERIOVENOUS FISTULA Right 4/31/5400   Procedure: PLICATION / REVISION OF ARTERIOVENOUS FISTULA;  Surgeon: Dale Mould, MD;  Location: [place];  Service: Vascular;  Laterality: Right;  . REVISON OF ARTERIOVENOUS FISTULA Left 09/01/2015   Procedure: SUPERFICIALIZATION OF LEFT ARM BRACHIOCEPHALIC ARTERIOVENOUS FISTULA;  Surgeon: Dale Mould, MD;  Location: [place];  Service: Vascular;  Laterality: Left;  . SHOULDER ARTHROSCOPY Left ~ 2006   frozen  . TOE AMPUTATION Bilateral after 2005  . UMBILICAL HERNIA REPAIR  1990's  . VENOGRAM Right 07/28/2011   Procedure: VENOGRAM;  Surgeon: Conrad Duson, MD;  Location: [facility] CATH LAB;  Service:  Cardiovascular;  Laterality: Right;  . VITRECTOMY Bilateral      (Not in an outpatient encounter)  Allergies  Allergen Reactions  . Contrast Media [Iodinated Diagnostic Agents] Rash and Itching    Systemic itching and transient rash appearance within hours of receiving contrast  Systemic itching and transient rash appearance within hours of receiving contrast  Redman Syndrome  . Prednisone Other (See Comments)    Increases sugar levels  . Rocephin [Ceftriaxone] Itching  . Adhesive [Tape] Itching and Other (See Comments)    Blisters on arm from adhesive tape at dialysis   . Amoxicillin Itching and Rash    .Has patient had a PCN reaction causing immediate rash, facial/tongue/throat swelling, SOB or lightheadedness with hypotension: Yes Has patient had a PCN reaction causing severe rash involving mucus membranes or skin necrosis: No Has patient had a PCN reaction that required hospitalization No Has patient had a PCN reaction occurring within the last 10 years: No If all of the above answers are "NO", then may proceed with Cephalosporin use.  Kary Kos [Ticagrelor] Nausea And Vomiting  . Buprenorphine Hcl Hives, Nausea Only and Rash  . Clopidogrel Rash  . Hydrocodone Rash  . Morphine Hives, Nausea Only and Rash  . Ciprofloxacin Itching and Rash  . Clindamycin/Lincomycin Hives and Itching  . Codeine Hives, Itching and Rash  . Doxycycline Rash  . Enalapril Rash    cough  . Levofloxacin Hives and Itching  . Mobic [Meloxicam] Hives and Rash  . Oxycodone Rash  . Vasotec Cough    Family History  Problem Relation Age of Onset  . Colon polyps Father   . Diabetes Father   . Hypertension Father   . Hypertension Mother   . Diabetes Mother   . Hyperlipidemia Mother   . Breast cancer Maternal Aunt   . Bone cancer Maternal Aunt   . Liver cancer Maternal Aunt   . Malignant hyperthermia Neg Hx   . Heart attack Neg Hx   . Stroke Neg Hx   . Colon cancer Neg Hx   . Stomach cancer  Neg Hx   . Pancreatic cancer Neg Hx   . Esophageal cancer Neg Hx     Social History  Substance Use Topics  . Smoking status: Never Smoker  . Smokeless tobacco: Never Used  . Alcohol use Yes     Comment: occasional    ROS: As per history of present illness, otherwise negative  BP (!) 112/54   Pulse 82   Temp 97.6 F (36.4 C) (Oral)   Resp 14   Ht  _0  (1.702 m)   Wt 268 lb (121.6 kg)   SpO2 98%   BMI 41.97 kg/m   Gen: awake, alert, NAD HEENT: anicteric, op clear CV: RRR, no mrg Pulm: CTA b/l Abd: soft, obese, NT/ND, +BS throughout Ext: bka, changes of chronic venous stasis 1+ LE edema Neuro: nonfocal  RELEVANT LABS AND IMAGING: CBC    Component Value Date/Time   WBC 7.4 04/13/2016 1458   RBC 3.94 (L) 04/13/2016 1458   HGB 11.6 (L) 04/13/2016 1458   HCT 39.7 04/13/2016 1458   PLT 272 04/13/2016 1458   MCV 100.8 (H) 04/13/2016 1458   MCH 29.4 04/13/2016 1458   MCHC 29.2 (L) 04/13/2016 1458   RDW 17.3 (H) 04/13/2016 1458   LYMPHSABS 2.1 04/13/2016 1458   MONOABS 0.5 04/13/2016 1458   EOSABS 0.3 04/13/2016 1458   BASOSABS 0.1 04/13/2016 1458    CMP     Component Value Date/Time   NA 135 04/13/2016 1458   K 4.0 04/13/2016 1458   CL 93 (L) 04/13/2016 1458   CO2 28 04/13/2016 1458   GLUCOSE 344 (H) 04/13/2016 1458   BUN 22 (H) 04/13/2016 1458   CREATININE 5.61 (H) 04/13/2016 1458   CALCIUM 9.0 04/13/2016 1458   PROT 6.5 02/26/2016 0301   ALBUMIN 2.5 (L) 02/27/2016 0835   AST 66 (H) 02/26/2016 0301   ALT 35 02/26/2016 0301   ALKPHOS 88 02/26/2016 0301   BILITOT 0.6 02/26/2016 0301   GFRNONAA 11 (L) 04/13/2016 1458   GFRAA 12 (L) 04/13/2016 1458    ASSESSMENT/PLAN: 51 year old male with a very complicated past medical history including but not limited to end-stage renal disease status post renal transplant now back on hemodialysis, CAD with PCI in September 2017 on Effient, diabetes, option dependent sleep apnea, prior left BKA, history of ischemic  colitis, history of gastritis who presents for outpatient upper endoscopy and colonoscopy due to persistently low hemoglobin and concern for chronic GI blood loss.  1. Mixed picture anemia (possible low iron with definitive anemia of chronic disease)/occasional rectal bleeding -- plan upper endoscopy and colonoscopy today on anticoagulation. We have discussed the elevated risk of this procedure in the setting of anticoagulation. Some therapeutic maneuvers may be limited by chronic anti-coagulation such as polypectomy of a large or pedunculated lesion. This was explained and he understands.  MAC.  HIGHER THAN BASELINE RISK.The nature of the procedure, as well as the risks, benefits, and alternatives were carefully and thoroughly reviewed with the patient. Ample time for discussion and questions allowed. The patient understood, was satisfied, and agreed to proceed.

## 2016-04-26 NOTE — Discharge Instructions (Addendum)
YOU HAD AN ENDOSCOPIC PROCEDURE TODAY: Refer to the procedure report and other information in the discharge instructions given to you for any specific questions about what was found during the examination. If this information does not answer your questions, please call [place] office at [phone] to clarify.  ° °YOU SHOULD EXPECT: Some feelings of bloating in the abdomen. Passage of more gas than usual. Walking can help get rid of the air that was put into your GI tract during the procedure and reduce the bloating. If you had a lower endoscopy (such as a colonoscopy or flexible sigmoidoscopy) you may notice spotting of blood in your stool or on the toilet paper. Some abdominal soreness may be present for a day or two, also. ° °DIET: Your first meal following the procedure should be a light meal and then it is ok to progress to your normal diet. A half-sandwich or bowl of soup is an example of a good first meal. Heavy or fried foods are harder to digest and may make you feel nauseous or bloated. Drink plenty of fluids but you should avoid alcoholic beverages for 24 hours. If you had a esophageal dilation, please see attached instructions for diet.   ° °ACTIVITY: Your care partner should take you home directly after the procedure. You should plan to take it easy, moving slowly for the rest of the day. You can resume normal activity the day after the procedure however YOU SHOULD NOT DRIVE, use power tools, machinery or perform tasks that involve climbing or major physical exertion for 24 hours (because of the sedation medicines used during the test).  ° °SYMPTOMS TO REPORT IMMEDIATELY: °A gastroenterologist can be reached at any hour. Please call [phone]  for any of the following symptoms:  °Following lower endoscopy (colonoscopy, flexible sigmoidoscopy) °Excessive amounts of blood in the stool  °Significant tenderness, worsening of abdominal pains  °Swelling of the abdomen that is new, acute  °Fever of 100° or  higher  °Following upper endoscopy (EGD, EUS, ERCP, esophageal dilation) °Vomiting of blood or coffee ground material  °New, significant abdominal pain  °New, significant chest pain or pain under the shoulder blades  °Painful or persistently difficult swallowing  °New shortness of breath  °Black, tarry-looking or red, bloody stools ° °FOLLOW UP:  °If any biopsies were taken you will be contacted by phone or by letter within the next 1-3 weeks. Call [phone]  if you have not heard about the biopsies in 3 weeks.  °Please also call with any specific questions about appointments or follow up tests. ° °

## 2016-04-26 NOTE — Anesthesia Preprocedure Evaluation (Addendum)
Anesthesia Evaluation  Patient identified by MRN, date of birth, ID band Patient awake    Reviewed: Allergy & Precautions, NPO status , Patient's Chart, lab work & pertinent test results  History of Anesthesia Complications (+) DIFFICULT AIRWAY and history of anesthetic complications  Airway Mallampati: II  TM Distance: >3 FB Neck ROM: Full    Dental  (+) Poor Dentition, Dental Advisory Given   Pulmonary shortness of breath, sleep apnea ,    Pulmonary exam normal breath sounds clear to auscultation       Cardiovascular hypertension, Pt. on medications + CAD, + Past MI, + Cardiac Stents and +CHF  Normal cardiovascular exam Rhythm:Regular Rate:Normal     Neuro/Psych Anxiety  Neuromuscular disease    GI/Hepatic GERD  ,  Endo/Other  diabetes, Type 2, Insulin DependentHypothyroidism   Renal/GU ESRF and DialysisRenal diseaseK 5.6     Musculoskeletal  (+) Arthritis , Fibromyalgia -  Abdominal   Peds  Hematology  (+) anemia ,   Anesthesia Other Findings   Reproductive/Obstetrics                            Anesthesia Physical  Anesthesia Plan  ASA: III  Anesthesia Plan: MAC   Post-op Pain Management:    Induction: Intravenous  Airway Management Planned: Natural Airway  Additional Equipment:   Intra-op Plan:   Post-operative Plan:   Informed Consent: I have reviewed the patients History and Physical, chart, labs and discussed the procedure including the risks, benefits and alternatives for the proposed anesthesia with the patient or authorized representative who has indicated his/her understanding and acceptance.     Plan Discussed with: CRNA  Anesthesia Plan Comments:         Anesthesia Quick Evaluation

## 2016-04-26 NOTE — Op Note (Signed)
[facility] Patient Name: Dale [name] Procedure Date: 04/26/2016 MRN: [id number] Attending MD: Jerene Bears , MD Date of Birth: 24-Sep-1965 CSN: [id number] Age: 51 Admit Type: Outpatient Procedure:                Upper GI endoscopy Indications:              Unexplained mixed picture anemia, concern for GI                            blood loss, history of gastritis at EGD in Sept 2017 Providers:                Lajuan Lines. Hilarie Fredrickson, MD, Kingsley Plan, RN, Cletis Athens, Technician Referring MD:             [facility] Medicines:                Monitored Anesthesia Care Complications:            No immediate complications. Estimated Blood Loss:     Estimated blood loss: none. Procedure:                Pre-Anesthesia Assessment:                           - Prior to the procedure, a History and Physical                            was performed, and patient medications and                            allergies were reviewed. [name] tolerance of                            previous anesthesia was also reviewed. The risks                            and benefits of the procedure and the sedation                            options and risks were discussed with the patient.                            All questions were answered, and informed consent                            was obtained. Anticoagulants: The patient has taken                            anticoagulant medication. It was decided not to                            withhold this medication prior to the procedure.  ASA Grade Assessment: III - A patient with severe                            systemic disease. After reviewing the risks and                            benefits, the patient was deemed in satisfactory                            condition to undergo the procedure.                           After obtaining informed consent, the endoscope was                passed under direct vision. Throughout the                            procedure, the patient's blood pressure, pulse, and                            oxygen saturations were monitored continuously. The                            [id number] ([id number]) scope was introduced through the                            mouth, and advanced to the second part of duodenum.                            The upper GI endoscopy was accomplished without                            difficulty. The patient tolerated the procedure                            well. Scope In: Scope Out: Findings:      Patchy candidiasis was found in the upper third of the esophagus and in       the middle third of the esophagus.      The exam of the esophagus was otherwise normal. Z-line regular at 42 cm.      The entire examined stomach was normal.      The examined duodenum was normal. Impression:               - Monilial esophagitis, mild.                           - Normal stomach.                           - Normal examined duodenum.                           - No specimens collected. Moderate Sedation:      N/A Recommendation:           - Patient has a  contact number available for                            emergencies. The signs and symptoms of potential                            delayed complications were discussed with the                            patient. Return to normal activities tomorrow.                            Written discharge instructions were provided to the                            patient.                           - Resume previous diet.                           - Continue present medications.                           - Proceed to colonoscopy today.                           - Fluconazole 200 mg x 1 day, 100 mg x 13 days for                            Candida esophagitis. Procedure Code(s):        --- Professional ---                           [phone], Esophagogastroduodenoscopy,  flexible,                            transoral; diagnostic, including collection of                            specimen(s) by brushing or washing, when performed                            (separate procedure) Diagnosis Code(s):        --- Professional ---                           B37.81, Candidal esophagitis                           D50.9, Iron deficiency anemia, unspecified CPT copyright 2016 American Medical Association. All rights reserved. The codes documented in this report are preliminary and upon coder review may  be revised to meet current compliance requirements. Jerene Bears, MD 04/26/2016 11:06:44 AM This report has been signed electronically. Number of Addenda: 0

## 2016-04-26 NOTE — Op Note (Signed)
[facility] Patient Name: Dale [name] Procedure Date: 04/26/2016 MRN: [id number] Attending MD: Jerene Bears , MD Date of Birth: 1966/01/12 CSN: [id number] Age: 51 Admit Type: Outpatient Procedure:                Colonoscopy Indications:              Heme positive stool, Unexplained iron deficiency                            anemia Providers:                Lajuan Lines. Hilarie Fredrickson, MD, Kingsley Plan, RN, Cletis Athens, Technician Referring MD:              Medicines:                Monitored Anesthesia Care Complications:            No immediate complications. Estimated Blood Loss:     Estimated blood loss: none. Procedure:                Pre-Anesthesia Assessment:                           - Prior to the procedure, a History and Physical                            was performed, and patient medications and                            allergies were reviewed. [name] tolerance of                            previous anesthesia was also reviewed. The risks                            and benefits of the procedure and the sedation                            options and risks were discussed with the patient.                            All questions were answered, and informed consent                            was obtained. Anticoagulants: The patient has taken                            anticoagulant medication. It was decided not to                            withhold this medication prior to the procedure.                            ASA Grade Assessment:  III - A patient with severe                            systemic disease. After reviewing the risks and                            benefits, the patient was deemed in satisfactory                            condition to undergo the procedure.                           - Prior to the procedure, a History and Physical                            was performed, and patient medications and                allergies were reviewed. [name] tolerance of                            previous anesthesia was also reviewed. The risks                            and benefits of the procedure and the sedation                            options and risks were discussed with the patient.                            All questions were answered, and informed consent                            was obtained. Anticoagulants: The patient has taken                            anticoagulant medication. It was decided not to                            withhold this medication prior to the procedure.                            ASA Grade Assessment: III - A patient with severe                            systemic disease. After reviewing the risks and                            benefits, the patient was deemed in satisfactory                            condition to undergo the procedure.                           After obtaining  informed consent, the colonoscope                            was passed under direct vision. Throughout the                            procedure, the patient's blood pressure, pulse, and                            oxygen saturations were monitored continuously. The                            [id number] ([id number]) scope was introduced through                            the anus and advanced to the the cecum, identified                            by appendiceal orifice and ileocecal valve. The                            colonoscopy was performed without difficulty. The                            patient tolerated the procedure well. The quality                            of the bowel preparation was good. The ileocecal                            valve, appendiceal orifice, and rectum were                            photographed. Scope In: 11:09:26 AM Scope Out: 11:27:57 AM Scope Withdrawal Time: 0 hours 6 minutes 57 seconds  Total Procedure Duration: 0 hours 18 minutes 31  seconds  Findings:      The digital rectal exam findings include posterior pile without active       fissure. Pertinent negatives include normal sphincter tone.      Multiple diverticula were found in the sigmoid colon, descending colon       and hepatic flexure. There was no evidence of diverticular bleeding.      The exam was otherwise without abnormality on direct and retroflexion       views. Impression:               - Mild diverticulosis in the sigmoid colon, in the                            descending colon and at the hepatic flexure. There                            was no evidence of diverticular bleeding.                           -  The examination was otherwise normal on direct                            and retroflexion views.                           - No specimens collected. Moderate Sedation:      N/A Recommendation:           - Patient has a contact number available for                            emergencies. The signs and symptoms of potential                            delayed complications were discussed with the                            patient. Return to normal activities tomorrow.                            Written discharge instructions were provided to the                            patient.                           - Resume previous diet.                           - Continue present medications.                           - Repeat colonoscopy in 10 years for screening                            purposes. Procedure Code(s):        --- Professional ---                           [phone], Colonoscopy, flexible; diagnostic, including                            collection of specimen(s) by brushing or washing,                            when performed (separate procedure) Diagnosis Code(s):        --- Professional ---                           K60.2, Anal fissure, unspecified                           R19.5, Other fecal abnormalities                            D50.9, Iron deficiency anemia, unspecified  K57.30, Diverticulosis of large intestine without                            perforation or abscess without bleeding CPT copyright 2016 American Medical Association. All rights reserved. The codes documented in this report are preliminary and upon coder review may  be revised to meet current compliance requirements. Jerene Bears, MD 04/26/2016 11:39:50 AM This report has been signed electronically. Number of Addenda: 0

## 2016-04-26 NOTE — Anesthesia Postprocedure Evaluation (Signed)
Anesthesia Post Note  Patient: Dale [name]  Procedure(s) Performed: Procedure(s) (LRB): ESOPHAGOGASTRODUODENOSCOPY (EGD) WITH PROPOFOL (N/A) COLONOSCOPY WITH PROPOFOL (N/A)  Patient location during evaluation: PACU Anesthesia Type: MAC Level of consciousness: awake and alert Pain management: pain level controlled Vital Signs Assessment: post-procedure vital signs reviewed and stable Respiratory status: spontaneous breathing, nonlabored ventilation, respiratory function stable and patient connected to nasal cannula oxygen Cardiovascular status: stable and blood pressure returned to baseline Anesthetic complications: no       Last Vitals:  Vitals:   04/26/16 1140 04/26/16 1150  BP: (!) 102/44 (!) 87/51  Pulse:    Resp:    Temp:      Last Pain:  Vitals:   04/26/16 1045  TempSrc: Oral                 Tiajuana Amass

## 2016-04-26 NOTE — Transfer of Care (Signed)
Immediate Anesthesia Transfer of Care Note  Patient: Dale [name]  Procedure(s) Performed: Procedure(s): ESOPHAGOGASTRODUODENOSCOPY (EGD) WITH PROPOFOL (N/A) COLONOSCOPY WITH PROPOFOL (N/A)  Patient Location: PACU  Anesthesia Type:MAC  Level of Consciousness:  sedated, patient cooperative and responds to stimulation  Airway & Oxygen Therapy:Patient Spontanous Breathing and Patient connected to face mask oxgen  Post-op Assessment:  Report given to PACU RN and Post -op Vital signs reviewed and stable  Post vital signs:  Reviewed and stable  Last Vitals:  Vitals:   04/26/16 1045 04/26/16 1138  BP: (!) 112/54   Pulse: 82 80  Resp: 14 (!) 21  Temp: 26.9 C     Complications: No apparent anesthesia complications

## 2016-04-27 ENCOUNTER — Encounter (HOSPITAL_COMMUNITY): Payer: Self-pay | Admitting: Internal Medicine

## 2016-04-27 ENCOUNTER — Telehealth: Payer: Self-pay | Admitting: Internal Medicine

## 2016-04-27 DIAGNOSIS — D509 Iron deficiency anemia, unspecified: Secondary | ICD-10-CM | POA: Diagnosis not present

## 2016-04-27 DIAGNOSIS — I120 Hypertensive chronic kidney disease with stage 5 chronic kidney disease or end stage renal disease: Secondary | ICD-10-CM | POA: Diagnosis not present

## 2016-04-27 DIAGNOSIS — Z89512 Acquired absence of left leg below knee: Secondary | ICD-10-CM | POA: Diagnosis not present

## 2016-04-27 DIAGNOSIS — Z940 Kidney transplant status: Secondary | ICD-10-CM | POA: Diagnosis not present

## 2016-04-27 DIAGNOSIS — N186 End stage renal disease: Secondary | ICD-10-CM | POA: Diagnosis not present

## 2016-04-27 DIAGNOSIS — Z8371 Family history of colonic polyps: Secondary | ICD-10-CM | POA: Diagnosis not present

## 2016-04-27 DIAGNOSIS — B3781 Candidal esophagitis: Secondary | ICD-10-CM | POA: Diagnosis not present

## 2016-04-27 DIAGNOSIS — K5730 Diverticulosis of large intestine without perforation or abscess without bleeding: Secondary | ICD-10-CM | POA: Diagnosis not present

## 2016-04-27 DIAGNOSIS — E1122 Type 2 diabetes mellitus with diabetic chronic kidney disease: Secondary | ICD-10-CM | POA: Diagnosis not present

## 2016-04-27 DIAGNOSIS — R195 Other fecal abnormalities: Secondary | ICD-10-CM | POA: Diagnosis not present

## 2016-04-27 MED ORDER — FLUCONAZOLE 100 MG PO TABS: ORAL_TABLET | ORAL | 0 refills | Status: DC

## 2016-04-27 NOTE — Telephone Encounter (Signed)
Rx sent. Patients wife advised.

## 2016-04-28 ENCOUNTER — Ambulatory Visit: Payer: Medicare Other | Admitting: Adult Health

## 2016-04-28 LAB — POCT I-STAT EG7
Acid-Base Excess: 6 mmol/L — ABNORMAL HIGH (ref 0.0–2.0)
Bicarbonate: 31.4 mmol/L — ABNORMAL HIGH (ref 20.0–28.0)
Calcium, Ion: 1.05 mmol/L — ABNORMAL LOW (ref 1.15–1.40)
HCT: 38 % — ABNORMAL LOW (ref 39.0–52.0)
Hemoglobin: 12.9 g/dL — ABNORMAL LOW (ref 13.0–17.0)
O2 Saturation: 69 %
Patient temperature: 97
Potassium: 5.6 mmol/L — ABNORMAL HIGH (ref 3.5–5.1)
Sodium: 131 mmol/L — ABNORMAL LOW (ref 135–145)
TCO2: 33 mmol/L (ref 0–100)
pCO2, Ven: 44.3 mmHg (ref 44.0–60.0)
pH, Ven: 7.455 — ABNORMAL HIGH (ref 7.250–7.430)
pO2, Ven: 33 mmHg (ref 32.0–45.0)

## 2016-04-28 LAB — POCT I-STAT 4, (NA,K, GLUC, HGB,HCT)
Glucose, Bld: 151 mg/dL — ABNORMAL HIGH (ref 65–99)
HCT: 39 % (ref 39.0–52.0)
Hemoglobin: 13.3 g/dL (ref 13.0–17.0)
Potassium: 6.7 mmol/L (ref 3.5–5.1)
Sodium: 129 mmol/L — ABNORMAL LOW (ref 135–145)

## 2016-05-02 ENCOUNTER — Telehealth: Payer: Self-pay | Admitting: Internal Medicine

## 2016-05-02 ENCOUNTER — Encounter: Payer: Self-pay | Admitting: Vascular Surgery

## 2016-05-02 ENCOUNTER — Ambulatory Visit: Payer: Medicare Other | Admitting: Physician Assistant

## 2016-05-02 NOTE — Telephone Encounter (Signed)
I have advised patient of Dr Vena Rua recommendations and he verbalizes understanding.

## 2016-05-02 NOTE — Telephone Encounter (Signed)
Patient complains of general aches as well as cold chills since starting fluconazole. No fever. No other symptoms. Patient states that he has taken 6 days worth of the fluconazole so far and wanted your advise as to whether he has to continue taking the medication or not.

## 2016-05-02 NOTE — Telephone Encounter (Signed)
Can stop fluconazole Candida esophagitis was mild and likely treated.  This was incidental and he was not symptomatic from this issue

## 2016-05-04 DIAGNOSIS — H16002 Unspecified corneal ulcer, left eye: Secondary | ICD-10-CM | POA: Diagnosis not present

## 2016-05-05 ENCOUNTER — Ambulatory Visit: Payer: Medicare Other | Admitting: Vascular Surgery

## 2016-05-06 DIAGNOSIS — H16002 Unspecified corneal ulcer, left eye: Secondary | ICD-10-CM | POA: Diagnosis not present

## 2016-05-12 DIAGNOSIS — N186 End stage renal disease: Secondary | ICD-10-CM | POA: Diagnosis not present

## 2016-05-12 DIAGNOSIS — I2510 Atherosclerotic heart disease of native coronary artery without angina pectoris: Secondary | ICD-10-CM | POA: Diagnosis not present

## 2016-05-12 DIAGNOSIS — I132 Hypertensive heart and chronic kidney disease with heart failure and with stage 5 chronic kidney disease, or end stage renal disease: Secondary | ICD-10-CM | POA: Diagnosis not present

## 2016-05-12 DIAGNOSIS — I5033 Acute on chronic diastolic (congestive) heart failure: Secondary | ICD-10-CM | POA: Diagnosis not present

## 2016-05-12 DIAGNOSIS — K219 Gastro-esophageal reflux disease without esophagitis: Secondary | ICD-10-CM | POA: Diagnosis not present

## 2016-05-12 DIAGNOSIS — D631 Anemia in chronic kidney disease: Secondary | ICD-10-CM | POA: Diagnosis not present

## 2016-05-12 DIAGNOSIS — M109 Gout, unspecified: Secondary | ICD-10-CM | POA: Diagnosis not present

## 2016-05-12 DIAGNOSIS — E1022 Type 1 diabetes mellitus with diabetic chronic kidney disease: Secondary | ICD-10-CM | POA: Diagnosis not present

## 2016-05-12 DIAGNOSIS — E10649 Type 1 diabetes mellitus with hypoglycemia without coma: Secondary | ICD-10-CM | POA: Diagnosis not present

## 2016-05-12 DIAGNOSIS — M797 Fibromyalgia: Secondary | ICD-10-CM | POA: Diagnosis not present

## 2016-05-24 DIAGNOSIS — T8789 Other complications of amputation stump: Secondary | ICD-10-CM | POA: Diagnosis not present

## 2016-05-24 DIAGNOSIS — E785 Hyperlipidemia, unspecified: Secondary | ICD-10-CM | POA: Diagnosis not present

## 2016-05-24 DIAGNOSIS — Z89512 Acquired absence of left leg below knee: Secondary | ICD-10-CM | POA: Diagnosis not present

## 2016-05-24 DIAGNOSIS — Z992 Dependence on renal dialysis: Secondary | ICD-10-CM | POA: Diagnosis not present

## 2016-05-24 DIAGNOSIS — I120 Hypertensive chronic kidney disease with stage 5 chronic kidney disease or end stage renal disease: Secondary | ICD-10-CM | POA: Diagnosis not present

## 2016-05-24 DIAGNOSIS — I2510 Atherosclerotic heart disease of native coronary artery without angina pectoris: Secondary | ICD-10-CM | POA: Diagnosis not present

## 2016-05-24 DIAGNOSIS — E1122 Type 2 diabetes mellitus with diabetic chronic kidney disease: Secondary | ICD-10-CM | POA: Diagnosis not present

## 2016-05-24 DIAGNOSIS — L89892 Pressure ulcer of other site, stage 2: Secondary | ICD-10-CM | POA: Diagnosis not present

## 2016-05-24 DIAGNOSIS — E1151 Type 2 diabetes mellitus with diabetic peripheral angiopathy without gangrene: Secondary | ICD-10-CM | POA: Diagnosis not present

## 2016-05-24 DIAGNOSIS — N186 End stage renal disease: Secondary | ICD-10-CM | POA: Diagnosis not present

## 2016-05-25 DIAGNOSIS — T8612 Kidney transplant failure: Secondary | ICD-10-CM | POA: Diagnosis not present

## 2016-05-25 DIAGNOSIS — Z992 Dependence on renal dialysis: Secondary | ICD-10-CM | POA: Diagnosis not present

## 2016-05-25 DIAGNOSIS — N186 End stage renal disease: Secondary | ICD-10-CM | POA: Diagnosis not present

## 2016-05-31 DIAGNOSIS — T8789 Other complications of amputation stump: Secondary | ICD-10-CM | POA: Diagnosis not present

## 2016-05-31 DIAGNOSIS — N186 End stage renal disease: Secondary | ICD-10-CM | POA: Diagnosis not present

## 2016-05-31 DIAGNOSIS — I120 Hypertensive chronic kidney disease with stage 5 chronic kidney disease or end stage renal disease: Secondary | ICD-10-CM | POA: Diagnosis not present

## 2016-05-31 DIAGNOSIS — E1151 Type 2 diabetes mellitus with diabetic peripheral angiopathy without gangrene: Secondary | ICD-10-CM | POA: Diagnosis not present

## 2016-05-31 DIAGNOSIS — E785 Hyperlipidemia, unspecified: Secondary | ICD-10-CM | POA: Diagnosis not present

## 2016-05-31 DIAGNOSIS — I739 Peripheral vascular disease, unspecified: Secondary | ICD-10-CM | POA: Diagnosis not present

## 2016-05-31 DIAGNOSIS — L89892 Pressure ulcer of other site, stage 2: Secondary | ICD-10-CM | POA: Diagnosis not present

## 2016-05-31 DIAGNOSIS — Z992 Dependence on renal dialysis: Secondary | ICD-10-CM | POA: Diagnosis not present

## 2016-05-31 DIAGNOSIS — I2510 Atherosclerotic heart disease of native coronary artery without angina pectoris: Secondary | ICD-10-CM | POA: Diagnosis not present

## 2016-05-31 DIAGNOSIS — E1122 Type 2 diabetes mellitus with diabetic chronic kidney disease: Secondary | ICD-10-CM | POA: Diagnosis not present

## 2016-05-31 DIAGNOSIS — Z89512 Acquired absence of left leg below knee: Secondary | ICD-10-CM | POA: Diagnosis not present

## 2016-06-02 DIAGNOSIS — M2550 Pain in unspecified joint: Secondary | ICD-10-CM | POA: Diagnosis not present

## 2016-06-07 DIAGNOSIS — H18832 Recurrent erosion of cornea, left eye: Secondary | ICD-10-CM | POA: Diagnosis not present

## 2016-06-09 DIAGNOSIS — L920 Granuloma annulare: Secondary | ICD-10-CM | POA: Diagnosis not present

## 2016-06-16 DIAGNOSIS — M1711 Unilateral primary osteoarthritis, right knee: Secondary | ICD-10-CM | POA: Diagnosis not present

## 2016-06-22 DIAGNOSIS — Z992 Dependence on renal dialysis: Secondary | ICD-10-CM | POA: Diagnosis not present

## 2016-06-22 DIAGNOSIS — T8612 Kidney transplant failure: Secondary | ICD-10-CM | POA: Diagnosis not present

## 2016-06-22 DIAGNOSIS — N186 End stage renal disease: Secondary | ICD-10-CM | POA: Diagnosis not present

## 2016-07-05 DIAGNOSIS — T8789 Other complications of amputation stump: Secondary | ICD-10-CM | POA: Diagnosis not present

## 2016-07-05 DIAGNOSIS — S80822A Blister (nonthermal), left lower leg, initial encounter: Secondary | ICD-10-CM | POA: Diagnosis not present

## 2016-07-05 DIAGNOSIS — E1142 Type 2 diabetes mellitus with diabetic polyneuropathy: Secondary | ICD-10-CM | POA: Diagnosis not present

## 2016-07-18 ENCOUNTER — Telehealth: Payer: Self-pay

## 2016-07-18 NOTE — Telephone Encounter (Signed)
rec'd request from [place] [facility] [place] for Fistulogram due to decreased access flows.  The pt. Remains on Effient with hx of Cardiac Stent 12/2015; advised not to discontinue or hold Effient x 1 yr. Post stent.  Dr. Donzetta Matters made aware of need for Fistlogram and pt. On Effient.  Left Arm Fistulogram scheduled for Tues., Apr. 3rd.  Pt. Given pre-procedure instructions.  Verb. Understanding/ agreed.

## 2016-07-19 ENCOUNTER — Other Ambulatory Visit: Payer: Self-pay

## 2016-07-19 DIAGNOSIS — L89893 Pressure ulcer of other site, stage 3: Secondary | ICD-10-CM | POA: Diagnosis not present

## 2016-07-19 DIAGNOSIS — Z89512 Acquired absence of left leg below knee: Secondary | ICD-10-CM | POA: Diagnosis not present

## 2016-07-23 DIAGNOSIS — T8612 Kidney transplant failure: Secondary | ICD-10-CM | POA: Diagnosis not present

## 2016-07-23 DIAGNOSIS — Z992 Dependence on renal dialysis: Secondary | ICD-10-CM | POA: Diagnosis not present

## 2016-07-23 DIAGNOSIS — N186 End stage renal disease: Secondary | ICD-10-CM | POA: Diagnosis not present

## 2016-07-26 ENCOUNTER — Encounter (HOSPITAL_COMMUNITY)
Admission: RE | Disposition: A | Discharge status: Home / Self Care | Payer: Self-pay | Source: Ambulatory Visit | Attending: Vascular Surgery

## 2016-07-26 ENCOUNTER — Encounter (HOSPITAL_COMMUNITY): Payer: Self-pay | Admitting: Vascular Surgery

## 2016-07-26 ENCOUNTER — Other Ambulatory Visit: Payer: Self-pay

## 2016-07-26 ENCOUNTER — Ambulatory Visit (HOSPITAL_COMMUNITY)
Admission: RE | Admit: 2016-07-26 | Discharge: 2016-07-26 | Disposition: A | Discharge status: Home / Self Care | Payer: Medicare Other | Source: Ambulatory Visit | Attending: Vascular Surgery | Admitting: Vascular Surgery

## 2016-07-26 DIAGNOSIS — Z955 Presence of coronary angioplasty implant and graft: Secondary | ICD-10-CM | POA: Diagnosis not present

## 2016-07-26 DIAGNOSIS — N186 End stage renal disease: Secondary | ICD-10-CM | POA: Insufficient documentation

## 2016-07-26 DIAGNOSIS — I252 Old myocardial infarction: Secondary | ICD-10-CM | POA: Insufficient documentation

## 2016-07-26 DIAGNOSIS — G4733 Obstructive sleep apnea (adult) (pediatric): Secondary | ICD-10-CM | POA: Insufficient documentation

## 2016-07-26 DIAGNOSIS — D631 Anemia in chronic kidney disease: Secondary | ICD-10-CM | POA: Diagnosis not present

## 2016-07-26 DIAGNOSIS — Z992 Dependence on renal dialysis: Secondary | ICD-10-CM | POA: Insufficient documentation

## 2016-07-26 DIAGNOSIS — K3184 Gastroparesis: Secondary | ICD-10-CM | POA: Diagnosis not present

## 2016-07-26 DIAGNOSIS — Z880 Allergy status to penicillin: Secondary | ICD-10-CM | POA: Diagnosis not present

## 2016-07-26 DIAGNOSIS — Z89421 Acquired absence of other right toe(s): Secondary | ICD-10-CM | POA: Diagnosis not present

## 2016-07-26 DIAGNOSIS — I132 Hypertensive heart and chronic kidney disease with heart failure and with stage 5 chronic kidney disease, or end stage renal disease: Secondary | ICD-10-CM | POA: Diagnosis not present

## 2016-07-26 DIAGNOSIS — I2510 Atherosclerotic heart disease of native coronary artery without angina pectoris: Secondary | ICD-10-CM | POA: Diagnosis not present

## 2016-07-26 DIAGNOSIS — E039 Hypothyroidism, unspecified: Secondary | ICD-10-CM | POA: Diagnosis not present

## 2016-07-26 DIAGNOSIS — M109 Gout, unspecified: Secondary | ICD-10-CM | POA: Insufficient documentation

## 2016-07-26 DIAGNOSIS — Z7982 Long term (current) use of aspirin: Secondary | ICD-10-CM | POA: Insufficient documentation

## 2016-07-26 DIAGNOSIS — E1022 Type 1 diabetes mellitus with diabetic chronic kidney disease: Secondary | ICD-10-CM | POA: Diagnosis not present

## 2016-07-26 DIAGNOSIS — K219 Gastro-esophageal reflux disease without esophagitis: Secondary | ICD-10-CM | POA: Insufficient documentation

## 2016-07-26 DIAGNOSIS — Z7951 Long term (current) use of inhaled steroids: Secondary | ICD-10-CM | POA: Insufficient documentation

## 2016-07-26 DIAGNOSIS — E1043 Type 1 diabetes mellitus with diabetic autonomic (poly)neuropathy: Secondary | ICD-10-CM | POA: Diagnosis not present

## 2016-07-26 DIAGNOSIS — Z6841 Body Mass Index (BMI) 40.0 and over, adult: Secondary | ICD-10-CM | POA: Insufficient documentation

## 2016-07-26 DIAGNOSIS — T82590A Other mechanical complication of surgically created arteriovenous fistula, initial encounter: Secondary | ICD-10-CM

## 2016-07-26 DIAGNOSIS — E785 Hyperlipidemia, unspecified: Secondary | ICD-10-CM | POA: Diagnosis not present

## 2016-07-26 DIAGNOSIS — T82898A Other specified complication of vascular prosthetic devices, implants and grafts, initial encounter: Secondary | ICD-10-CM | POA: Insufficient documentation

## 2016-07-26 DIAGNOSIS — Z91041 Radiographic dye allergy status: Secondary | ICD-10-CM | POA: Insufficient documentation

## 2016-07-26 DIAGNOSIS — Z89512 Acquired absence of left leg below knee: Secondary | ICD-10-CM | POA: Insufficient documentation

## 2016-07-26 DIAGNOSIS — I5032 Chronic diastolic (congestive) heart failure: Secondary | ICD-10-CM | POA: Insufficient documentation

## 2016-07-26 DIAGNOSIS — Y832 Surgical operation with anastomosis, bypass or graft as the cause of abnormal reaction of the patient, or of later complication, without mention of misadventure at the time of the procedure: Secondary | ICD-10-CM | POA: Insufficient documentation

## 2016-07-26 DIAGNOSIS — Z8249 Family history of ischemic heart disease and other diseases of the circulatory system: Secondary | ICD-10-CM | POA: Insufficient documentation

## 2016-07-26 DIAGNOSIS — M797 Fibromyalgia: Secondary | ICD-10-CM | POA: Insufficient documentation

## 2016-07-26 DIAGNOSIS — E1051 Type 1 diabetes mellitus with diabetic peripheral angiopathy without gangrene: Secondary | ICD-10-CM | POA: Insufficient documentation

## 2016-07-26 DIAGNOSIS — Z940 Kidney transplant status: Secondary | ICD-10-CM | POA: Diagnosis not present

## 2016-07-26 DIAGNOSIS — Z833 Family history of diabetes mellitus: Secondary | ICD-10-CM | POA: Insufficient documentation

## 2016-07-26 DIAGNOSIS — Z885 Allergy status to narcotic agent status: Secondary | ICD-10-CM | POA: Insufficient documentation

## 2016-07-26 HISTORY — PX: A/V FISTULAGRAM: CATH118298

## 2016-07-26 LAB — GLUCOSE, CAPILLARY
Glucose-Capillary: 171 mg/dL — ABNORMAL HIGH (ref 65–99)
Glucose-Capillary: 84 mg/dL (ref 65–99)

## 2016-07-26 LAB — POCT I-STAT, CHEM 8
BUN: 50 mg/dL — ABNORMAL HIGH (ref 6–20)
Calcium, Ion: 1.18 mmol/L (ref 1.15–1.40)
Chloride: 100 mmol/L — ABNORMAL LOW (ref 101–111)
Creatinine, Ser: 7.1 mg/dL — ABNORMAL HIGH (ref 0.61–1.24)
Glucose, Bld: 147 mg/dL — ABNORMAL HIGH (ref 65–99)
HCT: 39 % (ref 39.0–52.0)
Hemoglobin: 13.3 g/dL (ref 13.0–17.0)
Potassium: 4.4 mmol/L (ref 3.5–5.1)
Sodium: 140 mmol/L (ref 135–145)
TCO2: 34 mmol/L (ref 0–100)

## 2016-07-26 SURGERY — A/V FISTULAGRAM
Anesthesia: LOCAL | Laterality: Left

## 2016-07-26 MED ORDER — DIPHENHYDRAMINE HCL 50 MG/ML IJ SOLN
25.0000 mg | INTRAMUSCULAR | Status: AC
  Administered 2016-07-26: 25 mg via INTRAVENOUS

## 2016-07-26 MED ORDER — FENTANYL CITRATE (PF) 100 MCG/2ML IJ SOLN
INTRAMUSCULAR | Status: AC
  Filled 2016-07-26: qty 2

## 2016-07-26 MED ORDER — FENTANYL CITRATE (PF) 100 MCG/2ML IJ SOLN
INTRAMUSCULAR | Status: DC | PRN
  Administered 2016-07-26: 100 ug via INTRAVENOUS

## 2016-07-26 MED ORDER — METHYLPREDNISOLONE SODIUM SUCC 125 MG IJ SOLR
INTRAMUSCULAR | Status: AC
  Administered 2016-07-26: 125 mg via INTRAVENOUS
  Filled 2016-07-26: qty 2

## 2016-07-26 MED ORDER — DIPHENHYDRAMINE HCL 50 MG/ML IJ SOLN
INTRAMUSCULAR | Status: AC
  Administered 2016-07-26: 25 mg via INTRAVENOUS
  Filled 2016-07-26: qty 1

## 2016-07-26 MED ORDER — METHYLPREDNISOLONE SODIUM SUCC 125 MG IJ SOLR
125.0000 mg | INTRAMUSCULAR | Status: AC
  Administered 2016-07-26: 125 mg via INTRAVENOUS

## 2016-07-26 MED ORDER — FAMOTIDINE IN NACL 20-0.9 MG/50ML-% IV SOLN
INTRAVENOUS | Status: AC
  Administered 2016-07-26: 20 mg via INTRAVENOUS
  Filled 2016-07-26: qty 50

## 2016-07-26 MED ORDER — IODIXANOL 320 MG/ML IV SOLN
INTRAVENOUS | Status: DC | PRN
  Administered 2016-07-26: 25 mL via INTRAVENOUS

## 2016-07-26 MED ORDER — LIDOCAINE HCL (PF) 1 % IJ SOLN
INTRAMUSCULAR | Status: DC | PRN
  Administered 2016-07-26: 5 mL via INTRADERMAL

## 2016-07-26 MED ORDER — SODIUM CHLORIDE 0.9% FLUSH: 3.0000 mL | INTRAVENOUS | Status: DC | PRN

## 2016-07-26 MED ORDER — FAMOTIDINE IN NACL 20-0.9 MG/50ML-% IV SOLN
20.0000 mg | INTRAVENOUS | Status: AC
  Administered 2016-07-26: 20 mg via INTRAVENOUS

## 2016-07-26 MED ORDER — LIDOCAINE HCL (PF) 1 % IJ SOLN
INTRAMUSCULAR | Status: AC
  Filled 2016-07-26: qty 30

## 2016-07-26 MED ORDER — HEPARIN (PORCINE) IN NACL 2-0.9 UNIT/ML-% IJ SOLN
INTRAMUSCULAR | Status: DC | PRN
  Administered 2016-07-26: 1000 mL via INTRA_ARTERIAL

## 2016-07-26 MED ORDER — HEPARIN (PORCINE) IN NACL 2-0.9 UNIT/ML-% IJ SOLN
INTRAMUSCULAR | Status: AC
  Filled 2016-07-26: qty 1000

## 2016-07-26 SURGICAL SUPPLY — 10 items
BAG SNAP BAND KOVER 36X36 (MISCELLANEOUS) ×2 IMPLANT
COVER DOME SNAP 22 D (MISCELLANEOUS) ×2 IMPLANT
COVER PRB 48X5XTLSCP FOLD TPE (BAG) ×1 IMPLANT
COVER PROBE 5X48 (BAG) ×2
KIT MICROINTRODUCER STIFF 5F (SHEATH) ×2 IMPLANT
PROTECTION STATION PRESSURIZED (MISCELLANEOUS) ×2
STATION PROTECTION PRESSURIZED (MISCELLANEOUS) ×1 IMPLANT
STOPCOCK MORSE 400PSI 3WAY (MISCELLANEOUS) ×2 IMPLANT
TRAY PV CATH (CUSTOM PROCEDURE TRAY) ×2 IMPLANT
TUBING CIL FLEX 10 FLL-RA (TUBING) ×2 IMPLANT

## 2016-07-26 NOTE — Op Note (Signed)
    Patient name: Dale [name] MRN: [id number] DOB: 07/30/1965 Sex: male  07/26/2016 Pre-operative Diagnosis: esrd, malfunction of left arm avf Post-operative diagnosis:  Same Surgeon:  Eda Paschal. Donzetta Matters, MD Procedure Performed: 1.  US guided access of left arm fistula 2.  Left arm fistulogram  Indications:  51yo male of hd via left arm avf with slow rates recently. At last fistulogram he had cephalic vein occlusion that was stented and arterial anastomotic stricture that was balloon dilated as well.  Findings: The central venous system was barely patent as was the cephalic vein stent. The arterial anastomotic stricture was present again and was not intervened upon.   Procedure:  The patient was identified in the holding area and taken to room 8.  The patient was then placed supine on the table and prepped and draped in the usual sterile fashion.  A time out was called.  Ultrasound was used to evaluate the left arm fistula. We then used ultrasound guidance to cannulate the fistula with 21-gauge needle and placed a micropuncture sheath. We then performed fistulogram of the left arm and central veins and then performed retrograde evaluation which demonstrated the arterial anastomotic stricture which possibly represents a twist or kink. Without information and the early recurrence we elected to terminate the procedure and he will need open revision of this fistula. A 4-0 Monocryl figure-of-eight suture was placed and the micropuncture sheath removed and pressure held. Patient tolerated procedure well without immediate complication.  Contrast: 25cc   Deyvi Bonanno C. Donzetta Matters, MD [facility] Office: [phone] Pager: [phone]

## 2016-07-26 NOTE — Progress Notes (Addendum)
Dr Donzetta Matters notified no palpable left radial pulse

## 2016-07-26 NOTE — Discharge Instructions (Signed)
Fistulogram, Care After °Refer to this sheet in the next few weeks. These instructions provide you with information on caring for yourself after your procedure. Your health care provider may also give you more specific instructions. Your treatment has been planned according to current medical practices, but problems sometimes occur. Call your health care provider if you have any problems or questions after your procedure. °What can I expect after the procedure? °After your procedure, it is typical to have the following: °· A small amount of discomfort in the area where the catheters were placed. °· A small amount of bruising around the fistula. °· Sleepiness and fatigue. °Follow these instructions at home: °· Rest at home for the day following your procedure. °· Do not drive or operate heavy machinery while taking pain medicine. °· Take medicines only as directed by your health care provider. °· Do not take baths, swim, or use a hot tub until your health care provider approves. You may shower 24 hours after the procedure or as directed by your health care provider. °· There are many different ways to close and cover an incision, including stitches, skin glue, and adhesive strips. Follow your health care provider's instructions on: °¨ Incision care. °¨ Bandage (dressing) changes and removal. °¨ Incision closure removal. °· Monitor your dialysis fistula carefully. °Contact a health care provider if: °· You have drainage, redness, swelling, or pain at your catheter site. °· You have a fever. °· You have chills. °Get help right away if: °· You feel weak. °· You have trouble balancing. °· You have trouble moving your arms or legs. °· You have problems with your speech or vision. °· You can no longer feel a vibration or buzz when you put your fingers over your dialysis fistula. °· The limb that was used for the procedure: °¨ Swells. °¨ Is painful. °¨ Is cold. °¨ Is discolored, such as blue or pale white. °This information  is not intended to replace advice given to you by your health care provider. Make sure you discuss any questions you have with your health care provider. °Document Released: 08/26/2013 Document Revised: 09/17/2015 Document Reviewed: 05/31/2013 °Elsevier Interactive Patient Education © 2017 [employer] ° °

## 2016-07-26 NOTE — H&P (Signed)
HP  History of Present Illness: This is a 51 y.o. male with esrd via left arm avf. Fistula originally from 04/2015 was then superficialized. I placed a stent at the cephalic arch and ballooned his arterial anastomosis in November. Now returns with low flow rates. Has severe contrast allergy. Takes effient daily.  Past Medical History:  Diagnosis Date  . Anemia of chronic disease   . Arthritis    "hands, right knee" (03/19/2014) no cartlidge in right knee  . CAD (coronary artery disease)    a. Abnl nuc ->LHC (11/15):  mid to dist Dx 80%, mid RCA 99% (functional CTO), dist RCA 80% >> PCI: balloon angioplasty to mid RCA (could not deliver stent) - staged PCI Rotoblator atherectomy/DES to [facility]. b. NSTEMI 12/2015 s/p DES to diagonal.  . Cholecystitis    a. 08/27/2011  . Chronic diastolic CHF (congestive heart failure) ([place])    a. Echo 3/13:  EF 55-60%;  b. Echo (11/15):  Mild LVH, EF 60-65%, no RWMA, Gr 1 DD, MAC, mild LAE, normal RVF, mild RAE;  c.  Echo 5/16:  severe LVH, EF 55-60%, no RWMA, Gr 1 DD, MAC, mild LAE  . Claustrophobia    when things get around his face.   . Complication of anesthesia   . Difficult intubation    only once in 2015 at [facility] haven't had a problem since then  . ESRD (end stage renal disease) ([place])    a. 1995 s/p cadaveric transplant w/ susbequent failure after 18 yrs;  b.Dialysis initiated 07/2011 [facility] M[facility]  . Fibromyalgia   . Gastroparesis   . GERD (gastroesophageal reflux disease)   . Gout    PMH  . History of blood transfusion   . HLD (hyperlipidemia)   . Hypertension   . Hypothyroidism   . Inguinal lymphadenopathy    a. bilateral - s/p biopsy 07/2011  . Insulin dependent diabetes mellitus (HCC)    Type I  . Morbid obesity ([place])   . OSA (obstructive sleep apnea)    adjustable bed  . PAD (peripheral artery disease) (HCC)    a. s/p L BKA.  [name] Kitchen Pericardial effusion    a.  Small by CT 08/22/11;  b.  Large by CT 08/27/11. c. Not seen on 11/2015 echo.    . Pneumonia ~ 2007  . PONV (postoperative nausea and vomiting)    vomited once after a procedure.  . Tracheobronchomalacia     Past Surgical History:  Procedure Laterality Date  . AMPUTATION OF REPLICATED TOES Right   . ANGIOPLASTY  04/09/2014   RCA   . APPENDECTOMY  ~ 2006  . ARTERIOVENOUS GRAFT PLACEMENT Left 1993?   forearm  . ARTERIOVENOUS GRAFT PLACEMENT Right 1993?   leg  . ARTERIOVENOUS GRAFT PLACEMENT Left    Thigh  . Arteriovenous Graft Removed Left    Thigh  . AV FISTULA PLACEMENT  07/29/2011   Procedure: ARTERIOVENOUS (AV) FISTULA CREATION;  Surgeon: Mal Misty, MD;  Location: [place];  Service: Vascular;  Laterality: Right;  Brachial cephalic  . AV FISTULA PLACEMENT Left 01/20/2015   Procedure: LIGATION OF RIGHT ARTERIOVENOUS FISTULA WITH EXCISION OF ANEURYSM;  Surgeon: Angelia Mould, MD;  Location: [place];  Service: Vascular;  Laterality: Left;  . AV FISTULA PLACEMENT Left 04/30/2015   Procedure: CREATION OF LEFT ARM BRACHIO-CEPHALIC ARTERIOVENOUS (AV) FISTULA ;  Surgeon: Angelia Mould, MD;  Location: [place];  Service: Vascular;  Laterality: Left;  . AV Graft PLACEMENT Left  1993?   "attempted one in my wrist; didn't take"  . BELOW KNEE LEG AMPUTATION Left 2010  . CARDIAC CATHETERIZATION  03/19/2014  . CARDIAC CATHETERIZATION  03/19/2014   Procedure: CORONARY BALLOON ANGIOPLASTY;  Surgeon: Burnell Blanks, MD;  Location: [facility] CATH LAB;  Service: Cardiovascular;;  . CARDIAC CATHETERIZATION N/A 01/01/2016   Procedure: Left Heart Cath and Coronary Angiography;  Surgeon: Troy Sine, MD;  Location: [place] CV LAB;  Service: Cardiovascular;  Laterality: N/A;  . CARDIAC CATHETERIZATION N/A 01/01/2016   Procedure: Coronary Stent Intervention;  Surgeon: Troy Sine, MD;  Location: [place] CV LAB;  Service: Cardiovascular;  Laterality: N/A;  . CARPAL TUNNEL RELEASE Bilateral after 2005  . CATARACT EXTRACTION W/ INTRAOCULAR LENS  IMPLANT, BILATERAL  Bilateral 1990's  . COLONOSCOPY  08/29/2011   Procedure: COLONOSCOPY;  Surgeon: Jerene Bears, MD;  Location: [place];  Service: Gastroenterology;  Laterality: N/A;  . COLONOSCOPY WITH PROPOFOL N/A 04/26/2016   Procedure: COLONOSCOPY WITH PROPOFOL;  Surgeon: Jerene Bears, MD;  Location: [facility] ENDOSCOPY;  Service: Gastroenterology;  Laterality: N/A;  . CORONARY ANGIOPLASTY WITH STENT PLACEMENT  04/09/2014       ptca/des mid lad   . ESOPHAGOGASTRODUODENOSCOPY N/A 12/25/2015   Procedure: ESOPHAGOGASTRODUODENOSCOPY (EGD);  Surgeon: Mauri Pole, MD;  Location: [facility] ENDOSCOPY;  Service: Endoscopy;  Laterality: N/A;  bedside  . ESOPHAGOGASTRODUODENOSCOPY (EGD) WITH PROPOFOL N/A 04/26/2016   Procedure: ESOPHAGOGASTRODUODENOSCOPY (EGD) WITH PROPOFOL;  Surgeon: Jerene Bears, MD;  Location: [facility];  Service: Gastroenterology;  Laterality: N/A;  . EYE SURGERY Bilateral    cataract  . FEMORAL ARTERY REPAIR  04/09/2014   ANGIOSEAL  . INSERTION OF DIALYSIS CATHETER  08/01/2011   Procedure: INSERTION OF DIALYSIS CATHETER;  Surgeon: Rosetta Posner, MD;  Location: [place];  Service: Vascular;  Laterality: Right;  insertion of dialysis catheter on right internal jugular vein  . INSERTION OF DIALYSIS CATHETER Right 01/20/2015   Procedure: INSERTION OF DIALYSIS CATHETER - RIGHT INTERNAL JUGULAR ;  Surgeon: Angelia Mould, MD;  Location: [place];  Service: Vascular;  Laterality: Right;  . KIDNEY TRANSPLANT  04/25/1993  . LEFT HEART CATHETERIZATION WITH CORONARY ANGIOGRAM N/A 03/19/2014   Procedure: LEFT HEART CATHETERIZATION WITH CORONARY ANGIOGRAM;  Surgeon: Burnell Blanks, MD;  Location: [facility] CATH LAB;  Service: Cardiovascular;  Laterality: N/A;  . LYMPH NODE BIOPSY  08/10/2011   Procedure: LYMPH NODE BIOPSY;  Surgeon: Merrie Roof, MD;  Location: [place];  Service: General;  Laterality: Left;  left groin lymph biopsy  . NEUROPLASTY / TRANSPOSITION ULNAR NERVE AT ELBOW Left after 2005  . PERCUTANEOUS  CORONARY ROTOBLATOR INTERVENTION (PCI-R) N/A 04/09/2014   Procedure: PERCUTANEOUS CORONARY ROTOBLATOR INTERVENTION (PCI-R);  Surgeon: Burnell Blanks, MD;  Location: [facility] CATH LAB;  Service: Cardiovascular;  Laterality: N/A;  . PERIPHERAL VASCULAR CATHETERIZATION N/A 12/25/2014   Procedure: Upper Extremity Venography;  Surgeon: Conrad Viera West, MD;  Location: [place] CV LAB;  Service: Cardiovascular;  Laterality: N/A;  . PERIPHERAL VASCULAR CATHETERIZATION Left 02/24/2016   Procedure: Fistulagram;  Surgeon: Waynetta Sandy, MD;  Location: [place] CV LAB;  Service: Cardiovascular;  Laterality: Left;  . PERIPHERAL VASCULAR CATHETERIZATION Left 02/24/2016   Procedure: Peripheral Vascular Intervention;  Surgeon: Waynetta Sandy, MD;  Location: [place] CV LAB;  Service: Cardiovascular;  Laterality: Left;  AV FISTULA  . PERITONEAL CATHETER INSERTION    . PERITONEAL CATHETER REMOVAL    . REVISON OF ARTERIOVENOUS FISTULA  Right 6/38/7564   Procedure: PLICATION / REVISION OF ARTERIOVENOUS FISTULA;  Surgeon: Angelia Mould, MD;  Location: [place];  Service: Vascular;  Laterality: Right;  . REVISON OF ARTERIOVENOUS FISTULA Left 09/01/2015   Procedure: SUPERFICIALIZATION OF LEFT ARM BRACHIOCEPHALIC ARTERIOVENOUS FISTULA;  Surgeon: Angelia Mould, MD;  Location: [place];  Service: Vascular;  Laterality: Left;  . SHOULDER ARTHROSCOPY Left ~ 2006   frozen  . TOE AMPUTATION Bilateral after 2005  . UMBILICAL HERNIA REPAIR  1990's  . VENOGRAM Right 07/28/2011   Procedure: VENOGRAM;  Surgeon: Conrad Indios, MD;  Location: [facility] CATH LAB;  Service: Cardiovascular;  Laterality: Right;  . VITRECTOMY Bilateral     Allergies  Allergen Reactions  . Contrast Media [Iodinated Diagnostic Agents] Rash and Itching    Systemic itching and transient rash appearance within hours of receiving contrast  Systemic itching and transient rash appearance within hours of receiving contrast  [place]  Syndrome  . Prednisone Other (See Comments)    Increases sugar levels  . Rocephin [Ceftriaxone] Itching  . Adhesive [Tape] Itching and Other (See Comments)    Blisters on arm from adhesive tape at dialysis   . Amoxicillin Itching and Rash    Has patient had a PCN reaction causing immediate rash, facial/tongue/throat swelling, SOB or lightheadedness with hypotension: Yes Has patient had a PCN reaction causing severe rash involving mucus membranes or skin necrosis: No Has patient had a PCN reaction that required hospitalization No Has patient had a PCN reaction occurring within the last 10 years: No If all of the above answers are "NO", then may proceed with Cephalosporin use.  Kary Kos [Ticagrelor] Nausea And Vomiting  . Clopidogrel Rash  . Hydrocodone Rash  . Morphine Hives, Nausea Only and Rash  . Ciprofloxacin Itching and Rash  . Clindamycin/Lincomycin Hives and Itching  . Codeine Hives, Itching and Rash  . Doxycycline Rash  . Enalapril Rash    cough  . Levofloxacin Hives and Itching  . Mobic [Meloxicam] Hives and Rash  . Oxycodone Rash    Prior to Admission medications   Medication Sig Start Date End Date Taking? Authorizing Provider  acetaminophen (TYLENOL) 500 MG tablet Take 1,000 mg by mouth every 4 (four) hours as needed for moderate pain or headache.  01/16/16  Yes Historical Provider, MD  albuterol (PROVENTIL HFA;VENTOLIN HFA) 108 (90 Base) MCG/ACT inhaler Inhale 2 puffs into the lungs every 6 (six) hours as needed for wheezing or shortness of breath. 01/26/16  Yes Brand Males, MD  allopurinol (ZYLOPRIM) 100 MG tablet Take 200 mg by mouth daily.  03/14/12  Yes Historical Provider, MD  aspirin EC 81 MG tablet Take 81 mg by mouth at bedtime.   Yes Historical Provider, MD  atorvastatin (LIPITOR) 80 MG tablet Take 1 tablet (80 mg total) by mouth daily at 6 PM. Patient taking differently: Take 80 mg by mouth at bedtime.  01/04/16  Yes Florencia Reasons, MD  AURYXIA 1 GM 210 MG(Fe)  tablet Take 476ms with meals and 2175m with snacks 06/09/16  Yes Historical Provider, MD  calcium acetate (PHOSLO) 667 MG capsule Take 133441mwith meals and 667m5mith snacks 09/19/12  Yes Historical Provider, MD  docusate sodium (COLACE) 100 MG capsule Take 200 mg by mouth 2 (two) times daily.    Yes Historical Provider, MD  fenofibrate (TRICOR) 48 MG tablet Take 1 tablet (48 mg total) by mouth daily. Patient taking differently: Take 96 mg by mouth daily.  02/27/15  Yes Shanker  Kristeen Mans, MD  fluconazole (DIFLUCAN) 100 MG tablet Take 2 tablets on day 1, then take 1 tablet daily x 13 days thereafter Patient taking differently: Take 100 mg by mouth daily as needed (yeast).  04/27/16  Yes Jerene Bears, MD  fluticasone (FLONASE) 50 MCG/ACT nasal spray Place 2 sprays into both nostrils daily as needed for allergies.    Yes Historical Provider, MD  gabapentin (NEURONTIN) 300 MG capsule Take 300 mg by mouth 3 (three) times daily.   Yes Historical Provider, MD  GLUCAGON EMERGENCY 1 MG injection Inject 1 mg into the muscle daily as needed (low blood sugar).  01/19/12  Yes Historical Provider, MD  HYDROmorphone (DILAUDID) 2 MG tablet Take 0.5 tablets (1 mg total) by mouth 3 (three) times daily as needed for severe pain. Patient taking differently: Take 2 mg by mouth at bedtime. May take an additional 60ms as needed for severe pain 04/14/16  Yes DAlbertine Patricia MD  insulin glargine (LANTUS) 100 UNIT/ML injection Inject 0.26 mLs (26 Units total) into the skin at bedtime. Patient taking differently: Inject 36 Units into the skin at bedtime.  04/14/16  Yes DSilver HugueninElgergawy, MD  insulin lispro (HUMALOG) 100 UNIT/ML injection Inject into the skin 3 (three) times daily. Blood glucose - 120, 1 unit for every 20 points over 120   Yes Historical Provider, MD  levothyroxine (SYNTHROID, LEVOTHROID) 112 MCG tablet Take 112 mcg by mouth every evening. 2 hours after dinner   Yes Historical Provider, MD  loratadine  (CLARITIN) 10 MG tablet Take 10 mg by mouth daily.   Yes Historical Provider, MD  metoCLOPramide (REGLAN) 10 MG tablet Take 10 mg by mouth 4 (four) times daily -  with meals and at bedtime.   Yes Historical Provider, MD  midodrine (PROAMATINE) 10 MG tablet Take 1 tablet (10 mg total) by mouth 3 (three) times daily. Takes 1 tab in mornings of dialysis and takes 2 tabs 30 mins before starting dialysis (m,w,f) Patient taking differently: Take 10-20 mg by mouth every Monday, Wednesday, and Friday. Takes 1 tab in mornings of dialysis and takes 2 tabs 30 mins before starting dialysis (m,w,f) 04/14/16  Yes DAlbertine Patricia MD  multivitamin (RENA-VIT) TABS tablet Take 1 tablet by mouth daily.   Yes Historical Provider, MD  nitroGLYCERIN (NITROSTAT) 0.4 MG SL tablet Place 1 tablet (0.4 mg total) under the tongue every 5 (five) minutes as needed for chest pain. 02/15/16  Yes CBurnell Blanks MD  Nutritional Supplements (FEEDING SUPPLEMENT, NEPRO CARB STEADY,) LIQD Take 237 mLs by mouth daily. 01/04/16  Yes FFlorencia Reasons MD  omeprazole (PRILOSEC OTC) 20 MG tablet Take 20 mg by mouth daily.   Yes Historical Provider, MD  Polyethyl Glycol-Propyl Glycol (SYSTANE) 0.4-0.3 % SOLN Place 1 drop into both eyes as needed (dry eyes).   Yes Historical Provider, MD  polyethylene glycol (MIRALAX / GLYCOLAX) packet Take 17 g by mouth daily. Patient taking differently: Take 17 g by mouth daily as needed for mild constipation.  02/06/16  Yes Belkys A Regalado, MD  prasugrel (EFFIENT) 10 MG TABS tablet Take 10 mg by mouth daily.   Yes Historical Provider, MD  promethazine (PHENERGAN) 25 MG tablet Take 1 tablet (25 mg total) by mouth every 6 (six) hours as needed for nausea or vomiting. For nausea 11/27/14  Yes SLiliane Shi PA-C  rOPINIRole (REQUIP) 2 MG tablet Take 2 mg by mouth every evening.    Yes Historical Provider, MD  sodium  chloride (MURO 128) 5 % ophthalmic ointment Place 1 application into the left eye at bedtime.    Yes Historical Provider, MD  sodium chloride (MURO 128) 5 % ophthalmic solution Place 1 drop into the left eye daily.   Yes Historical Provider, MD  Wound Cleansers (WOUND [place]) LIQD Apply 1 application topically daily as needed (wound care).   Yes Historical Provider, MD  insulin aspart (NOVOLOG) 100 UNIT/ML injection Inject 0-15 Units into the skin 3 (three) times daily with meals. Patient not taking: Reported on 07/22/2016 04/14/16   Silver Huguenin Elgergawy, MD  metoprolol tartrate (LOPRESSOR) 25 MG tablet Take 25 mg by mouth 2 (two) times daily.    Historical Provider, MD  OXYGEN Inhale 3 L into the lungs at bedtime.    Historical Provider, MD    Social History   Social History  . Marital status: Married    Spouse name: N/A  . Number of children: N/A  . Years of education: N/A   Occupational History  . Disabled    Social History Main Topics  . Smoking status: Never Smoker  . Smokeless tobacco: Never Used  . Alcohol use Yes     Comment: occasional  . Drug use: No  . Sexual activity: Not on file   Other Topics Concern  . Not on file   Social History Narrative   Lives @ home with his wife in [place].      Family History  Problem Relation Age of Onset  . Colon polyps Father   . Diabetes Father   . Hypertension Father   . Hypertension Mother   . Diabetes Mother   . Hyperlipidemia Mother   . Breast cancer Maternal Aunt   . Bone cancer Maternal Aunt   . Liver cancer Maternal Aunt   . Malignant hyperthermia Neg Hx   . Heart attack Neg Hx   . Stroke Neg Hx   . Colon cancer Neg Hx   . Stomach cancer Neg Hx   . Pancreatic cancer Neg Hx   . Esophageal cancer Neg Hx       Physical Examination  Vitals:   07/26/16 0546  BP: (!) 95/49  Pulse: 87  Temp: 97.5 F (36.4 C)   Body mass index is 40.29 kg/m.  General:  WDWN in NAD HENT: WNL, normocephalic Pulmonary: normal non-labored breathing, without Rales, rhonchi,  wheezing Cardiac:rrr Abdomen:soft  ntnd Extremities: left arm with faint thrill near antecubitum, presence of collateral veins Musculoskeletal: no muscle wasting or atrophy  Neurologic: A&O X 3; Appropriate Affect ; SENSATION: normal; MOTOR FUNCTION:  moving all extremities equally. Speech is fluent/normal   CBC    Component Value Date/Time   WBC 7.4 04/13/2016 1458   RBC 3.94 (L) 04/13/2016 1458   HGB 13.3 07/26/2016 0646   HCT 39.0 07/26/2016 0646   PLT 272 04/13/2016 1458   MCV 100.8 (H) 04/13/2016 1458   MCH 29.4 04/13/2016 1458   MCHC 29.2 (L) 04/13/2016 1458   RDW 17.3 (H) 04/13/2016 1458   LYMPHSABS 2.1 04/13/2016 1458   MONOABS 0.5 04/13/2016 1458   EOSABS 0.3 04/13/2016 1458   BASOSABS 0.1 04/13/2016 1458    BMET    Component Value Date/Time   NA 140 07/26/2016 0646   K 4.4 07/26/2016 0646   CL 100 (L) 07/26/2016 0646   CO2 28 04/13/2016 1458   GLUCOSE 147 (H) 07/26/2016 0646   BUN 50 (H) 07/26/2016 0646   CREATININE 7.10 (H) 07/26/2016 0646   CALCIUM  9.0 04/13/2016 1458   GFRNONAA 11 (L) 04/13/2016 1458   GFRAA 12 (L) 04/13/2016 1458    COAGS: Lab Results  Component Value Date   INR 1.26 02/25/2016   INR 1.20 12/21/2015   INR 1.14 11/20/2014      ASSESSMENT/PLAN: This is a 51 y.o. male with esrd with malfunction of left arm avf with low flow rates. fistulogram with possible intervention today. Premedicate for contrast allergy.  Sukhmani Fetherolf C. Donzetta Matters, MD [facility] Office: [phone] Pager: [phone]

## 2016-08-02 ENCOUNTER — Encounter (HOSPITAL_COMMUNITY): Payer: Self-pay | Admitting: *Deleted

## 2016-08-02 NOTE — Progress Notes (Signed)
Pt denies SOB and chest pain. Pt under the care of Dr. [name], Cardiology. Pt made aware to stop taking vitamins, fish oil and herbal medications. Do not take any NSAIDs ie: Ibuprofen, Advil, Naproxen, BC and Goody Powder. Pt stated that he was instructed to take half of his Lantus Insulin at [facility] for his previous procedure by the surgeon. Pt made aware to check BG every 2 hours prior to arrival to hospital, if BG < 70 use 4 glucose tabs or glucose gel or 4 ounces of apple or cranberry juice, wait 15 minutes after intervention and recheck BG, if BG remains < 70, call the SS unit. Pt made aware to take half of correction dose of Humalog on morning of procedure for BG >220. Pt stated that he does not take Metoprolol if BP is low, usually takes medications at 10:00 AM and will take morning medications on the DOS  when discharged to home. Pt verbalized understanding of all pre-op instructions. Anesthesia asked to review pt cardiac history.

## 2016-08-03 ENCOUNTER — Telehealth: Payer: Self-pay | Admitting: Cardiovascular Disease

## 2016-08-03 MED ORDER — VANCOMYCIN HCL 10 G IV SOLR
1500.0000 mg | INTRAVENOUS | Status: AC
  Filled 2016-08-03: qty 1500

## 2016-08-03 NOTE — Progress Notes (Signed)
Anesthesia Chart Review:  Pt is a same day work up.   Pt is a 51 year old [name] scheduled for revision of arterial anastomosis of L AV fistula on 08/04/2016 with Servando Snare, MD.   - PCP is [name], MD - Cardiologist is Lauree Chandler, MD; last seen by cardiology 02/03/16 while inpatient - Nephrologist is Fleet Contras, MD  PMH includes:  CAD (DES to diagonal 12/2015; DES to mid-RCA 2015), chronic diastolic CHF, HTN, DM, hyperlipidemia, ESRD (on hemodialysis; s/p cadaveric transplant 1995 that failed after 18 years), tracheobronchomalacia, OSA, anemia of chronic disease, hypothyroidism, post-op N/V, GERD.  Never smoker. BMI 40. S/p L BKA 2010.   - Hospitalized 12/20-12/21/17 for hypotension after dialysis, midodrine increased, metoprolol stopped.   - hospitalized 11/2-11/5/17 for fever and chills, concern for sepsis but this was ruled out.  - Hospitalized 10/10-10/[place]/17 for acute on chronic CHF due to fluid overload, associated with chest pain thought to be due to pulmonary congestion.   - hospitalized 9/26-9/28/17 for nausea and vomiting thought to be due to gastroparesis.   Anesthesia history includes: DIFFICULT INTUBATION.   - [facility] records in [place] from 10/16/13 that indicate glidescope was used. At [facility] on 08/10/11, he underwent GETA using IV induction, cricoid pression, MAC 4, stylet to place 7.5 mm ETT.   Anesthesia records 04/30/15 and 01/20/15 show 1 attempt at intubation using Glidescope and 7.85m ETT.    Medications include: Albuterol, ASA 81 mg, Lipitor, iron, fenofibrate, Lantus, Humalog, levothyroxine, midodrine, Effient. Pt to continue ASA and effient perioperatively.   Labs will be obtained DOS.   CXR 04/13/16: Cardiomegaly without failure. Next item no acute cardiopulmonary disease.  EKG 04/13/16: Sinus rhythm. Borderline prolonged QT interval  Echo 02/03/16:  - Procedure narrative: Transthoracic echocardiography. Image quality was suboptimal. The  study was technically difficult. Intravenous contrast (Definity) was administered. - Left ventricle: The cavity size was normal. Wall thickness was increased in a pattern of mild LVH. Systolic function was normal. The estimated ejection fraction was in the range of 55% to 60%.  Wall motion was normal; there were no regional wall motion abnormalities. Features are consistent with a pseudonormal left ventricular filling pattern, with concomitant abnormal relaxation and increased filling pressure (grade 2 diastolic dysfunction). - Mitral valve: Calcified annulus. - Left atrium: The atrium was mildly dilated.  Cardiac cath 01/01/16:   Ost LAD lesion, 30 %stenosed.  Dist LAD lesion, 30 %stenosed.  Prox RCA to Mid RCA lesion, 0 %stenosed.  1st RPLB lesion, 50 %stenosed.  A STENT XIENCE ALPINE RX 22.94T65drug eluting stent was successfully placed.  1st Diag lesion, 95 %stenosed.  Post intervention, there is a 0% residual stenosis.  Dist RCA lesion, 30 %stenosed. - Two-vessel coronary obstructive disease with mild calcification of the proximal LAD with 30% narrowing, progressive 95% mid diagonal stenosis, 30% mid LAD narrowing; normal left circumflex coronary artery; and calcified large RCA with a widely patent stent in the mid RCA at the site of previous atherectomy, 30% acute margin narrowing, and 50% inferior branch PDA stenosis.  - Successful PCI to the diagonal vessel with ultimate insertion of a 2.2515 mm Xience Alpine DES stent postdilated with a 2.4 mm with the 95% stenosis being reduced to 0%.  Reviewed case with Dr. GNyoka Cowden  Pt has not seen Dr. MAngelena Formsince his DES was placed 12/2015; last seen by cardiology 02/03/16 while in the hospital.  Pt will need cardiac clearance prior to surgery. I notified SColletta Marylandin Dr. CClaretha Cooperoffice.  Willeen Cass, FNP-BC [facility]/Anesthesiology Phone: [phone] 08/03/2016 4:23 PM

## 2016-08-03 NOTE — Telephone Encounter (Signed)
Erlene Quan, We received a call just now for cardiac clearance on [name]. We cannot provide cardiac clearance on this patient today on such short notice. I have not seen him since October 2017. This call was received in our office at 4:23pm today and his surgery is planned for tomorrow morning. This is not enough notice to work him in to our schedule today in the office. If he is having no chest pain, then it would likely be safe to proceed with the surgery. If formal cardiac clearance is needed and requested by anesthesia, he will need to have his procedure delayed and be seen in our office or in the hospital as in inpatient.   Lauree Chandler 08/03/2016 4:46 PM

## 2016-08-03 NOTE — Telephone Encounter (Signed)
Left message on secure voicemail at [facility] that Dr. Camillia Herter message was viewable in EP[facility].  Left message to contact us if appointment for office visit needs to be scheduled or if other questions.

## 2016-08-03 NOTE — Telephone Encounter (Signed)
New message   Request for surgical clearance:  1. What type of surgery is being performed? Revision of fistula   2. When is this surgery scheduled? Tomorrow morning- check in at 5:30am  3. Are there any medications that need to be held prior to surgery and how long? No.  4. Name of physician performing surgery? Dr. Servando Snare  5. What is your office phone and fax number? [phone] Phone-[phone]   Dale [name] is calling for clearance. She said she needs to know this afternoon.

## 2016-08-04 NOTE — Telephone Encounter (Signed)
I spoke with Colletta Maryland at [facility] and surgery has been postponed until cardiac clearance received.  I spoke with pt and scheduled him to see Dr. [name] tomorrow at 12:20

## 2016-08-05 ENCOUNTER — Encounter: Payer: Self-pay | Admitting: Cardiovascular Disease

## 2016-08-05 ENCOUNTER — Ambulatory Visit (INDEPENDENT_AMBULATORY_CARE_PROVIDER_SITE_OTHER): Payer: Medicare Other | Admitting: Cardiovascular Disease

## 2016-08-05 VITALS — BP 100/50 | HR 83 | Ht 68.0 in | Wt 286.4 lb

## 2016-08-05 DIAGNOSIS — Z01810 Encounter for preprocedural cardiovascular examination: Secondary | ICD-10-CM

## 2016-08-05 DIAGNOSIS — I2510 Atherosclerotic heart disease of native coronary artery without angina pectoris: Secondary | ICD-10-CM

## 2016-08-05 NOTE — Patient Instructions (Signed)
Medication Instructions:  Your physician recommends that you continue on your current medications as directed. Please refer to the Current Medication list given to you today.   Labwork: none  Testing/Procedures: none  Follow-Up: Your physician recommends that you schedule a follow-up appointment in: 6 months.  Please call our office in about 3 months to schedule this appointment.     Any Other Special Instructions Will Be Listed Below (If Applicable).     If you need a refill on your cardiac medications before your next appointment, please call your pharmacy.   

## 2016-08-05 NOTE — Progress Notes (Signed)
Chief Complaint  Patient presents with  . Follow-up    Pre-op examination     History of Present Illness: 51 yo male with history of CAD, DM, HTN, OSA, morbid obesity, ESRD on HD, fibromyalgia, chronic anemia, PAD status post left BKA who is here today for hospital cardiac follow up. I met him in the hospital in February 2014 when he was admitted with chest pain. We did not plan a cardiac cath then due to his renal insufficiency. He has since been started back on HD. He was seen in the office October 2015 by Richardson Dopp, PA-C and c/o dyspnea with exertion. He denied chest discomfort. Echo demonstrated normal LVEF and mild diastolic dysfunction. A myoview was obtained and this was abnormal with inferior, apical and apical lateral ischemia (mod risk). Cardiac cath on 03/19/14 with severe calcified RCA stenosis that was treated with angioplasty but I could not expand the balloon. There appeared to be a small dissection so I brought him back for staged rotablator atherectomy of the mid RCA on 04/09/14. A 3.5 x 23 mm Xience DES was placed in the mid RCA. The distal RCA lesion was treated with balloon angioplasty alone. Possible allergic reaction to Plavix but did well with Effient. He developed a left groin AV fistula following his cath.  Admitted September 2017 with left arm pain, NSTEMI. Repeat cath in September 2017 with severe disease in the diagonal branch treated with a drug eluting stent. He was discharged on ASA and Brilinta but due to nausea, he was changed to Effient. Echo October 2017 with normal LV systolic function. No valve disease.   He is here today for follow up. He is planning a revision of his AV fistula and is here today for risk assessment prior to the procedure. The patient denies any chest pain, dyspnea, palpitations, lower extremity edema, orthopnea, PND, dizziness, near syncope or syncope. He actually feels well overall.   Primary care: ASRES,ALEHEGN, MD Nephrology:  Mercy Moore  Past Medical History:  Diagnosis Date  . Anemia of chronic disease   . Arthritis    "hands, right knee" (03/19/2014) no cartlidge in right knee  . CAD (coronary artery disease)    a. Abnl nuc ->LHC (11/15):  mid to dist Dx 80%, mid RCA 99% (functional CTO), dist RCA 80% >> PCI: balloon angioplasty to mid RCA (could not deliver stent) - staged PCI Rotoblator atherectomy/DES to [facility]. b. NSTEMI 12/2015 s/p DES to diagonal.  . Cholecystitis    a. 08/27/2011  . Chronic diastolic CHF (congestive heart failure) ([place])    a. Echo 3/13:  EF 55-60%;  b. Echo (11/15):  Mild LVH, EF 60-65%, no RWMA, Gr 1 DD, MAC, mild LAE, normal RVF, mild RAE;  c.  Echo 5/16:  severe LVH, EF 55-60%, no RWMA, Gr 1 DD, MAC, mild LAE  . Claustrophobia    when things get around his face.   . Complication of anesthesia   . Difficult intubation    only once in 2015 at [facility] haven't had a problem since then  . ESRD (end stage renal disease) ([place])    a. 1995 s/p cadaveric transplant w/ susbequent failure after 18 yrs;  b.Dialysis initiated 07/2011 [facility] M[facility]F  . Fibromyalgia   . Gastroparesis   . GERD (gastroesophageal reflux disease)   . Gout    PMH  . History of blood transfusion   . HLD (hyperlipidemia)   . Hypertension   . Hypothyroidism   . Inguinal  lymphadenopathy    a. bilateral - s/p biopsy 07/2011  . Insulin dependent diabetes mellitus (HCC)    Type I  . Morbid obesity ([place])   . OSA (obstructive sleep apnea)    adjustable bed  . PAD (peripheral artery disease) (HCC)    a. s/p L BKA.  [name] Kitchen Pericardial effusion    a.  Small by CT 08/22/11;  b.  Large by CT 08/27/11. c. Not seen on 11/2015 echo.  . Pneumonia ~ 2007  . PONV (postoperative nausea and vomiting)    vomited once after a procedure.  . Tracheobronchomalacia     Past Surgical History:  Procedure Laterality Date  . A/V SHUNTOGRAM Left 07/26/2016   Procedure: A/V Fistulagram;  Surgeon: Waynetta Sandy, MD;  Location: [place] CV LAB;  Service: Cardiovascular;  Laterality: Left;  . AMPUTATION OF REPLICATED TOES Right   . ANGIOPLASTY  04/09/2014   RCA   . APPENDECTOMY  ~ 2006  . ARTERIOVENOUS GRAFT PLACEMENT Left 1993?   forearm  . ARTERIOVENOUS GRAFT PLACEMENT Right 1993?   leg  . ARTERIOVENOUS GRAFT PLACEMENT Left    Thigh  . Arteriovenous Graft Removed Left    Thigh  . AV FISTULA PLACEMENT  07/29/2011   Procedure: ARTERIOVENOUS (AV) FISTULA CREATION;  Surgeon: Mal Misty, MD;  Location: [place];  Service: Vascular;  Laterality: Right;  Brachial cephalic  . AV FISTULA PLACEMENT Left 01/20/2015   Procedure: LIGATION OF RIGHT ARTERIOVENOUS FISTULA WITH EXCISION OF ANEURYSM;  Surgeon: Angelia Mould, MD;  Location: [place];  Service: Vascular;  Laterality: Left;  . AV FISTULA PLACEMENT Left 04/30/2015   Procedure: CREATION OF LEFT ARM BRACHIO-CEPHALIC ARTERIOVENOUS (AV) FISTULA ;  Surgeon: Angelia Mould, MD;  Location: [place];  Service: Vascular;  Laterality: Left;  . AV Graft PLACEMENT Left 1993?   "attempted one in my wrist; didn't take"  . BELOW KNEE LEG AMPUTATION Left 2010  . CARDIAC CATHETERIZATION  03/19/2014  . CARDIAC CATHETERIZATION  03/19/2014   Procedure: CORONARY BALLOON ANGIOPLASTY;  Surgeon: Burnell Blanks, MD;  Location: [facility] CATH LAB;  Service: Cardiovascular;;  . CARDIAC CATHETERIZATION N/A 01/01/2016   Procedure: Left Heart Cath and Coronary Angiography;  Surgeon: Troy Sine, MD;  Location: [place] CV LAB;  Service: Cardiovascular;  Laterality: N/A;  . CARDIAC CATHETERIZATION N/A 01/01/2016   Procedure: Coronary Stent Intervention;  Surgeon: Troy Sine, MD;  Location: [place] CV LAB;  Service: Cardiovascular;  Laterality: N/A;  . CARPAL TUNNEL RELEASE Bilateral after 2005  . CATARACT EXTRACTION W/ INTRAOCULAR LENS  IMPLANT, BILATERAL Bilateral 1990's  . COLONOSCOPY  08/29/2011   Procedure: COLONOSCOPY;  Surgeon: Jerene Bears, MD;  Location: [place];   Service: Gastroenterology;  Laterality: N/A;  . COLONOSCOPY WITH PROPOFOL N/A 04/26/2016   Procedure: COLONOSCOPY WITH PROPOFOL;  Surgeon: Jerene Bears, MD;  Location: [facility] ENDOSCOPY;  Service: Gastroenterology;  Laterality: N/A;  . CORONARY ANGIOPLASTY WITH STENT PLACEMENT  04/09/2014       ptca/des mid lad   . ESOPHAGOGASTRODUODENOSCOPY N/A 12/25/2015   Procedure: ESOPHAGOGASTRODUODENOSCOPY (EGD);  Surgeon: Mauri Pole, MD;  Location: [facility] ENDOSCOPY;  Service: Endoscopy;  Laterality: N/A;  bedside  . ESOPHAGOGASTRODUODENOSCOPY (EGD) WITH PROPOFOL N/A 04/26/2016   Procedure: ESOPHAGOGASTRODUODENOSCOPY (EGD) WITH PROPOFOL;  Surgeon: Jerene Bears, MD;  Location: [facility] ENDOSCOPY;  Service: Gastroenterology;  Laterality: N/A;  . EYE SURGERY Bilateral    cataract  . FEMORAL ARTERY REPAIR  04/09/2014   ANGIOSEAL  .  INSERTION OF DIALYSIS CATHETER  08/01/2011   Procedure: INSERTION OF DIALYSIS CATHETER;  Surgeon: Rosetta Posner, MD;  Location: [place];  Service: Vascular;  Laterality: Right;  insertion of dialysis catheter on right internal jugular vein  . INSERTION OF DIALYSIS CATHETER Right 01/20/2015   Procedure: INSERTION OF DIALYSIS CATHETER - RIGHT INTERNAL JUGULAR ;  Surgeon: Angelia Mould, MD;  Location: [place];  Service: Vascular;  Laterality: Right;  . KIDNEY TRANSPLANT  04/25/1993  . LEFT HEART CATHETERIZATION WITH CORONARY ANGIOGRAM N/A 03/19/2014   Procedure: LEFT HEART CATHETERIZATION WITH CORONARY ANGIOGRAM;  Surgeon: Burnell Blanks, MD;  Location: [facility] CATH LAB;  Service: Cardiovascular;  Laterality: N/A;  . LYMPH NODE BIOPSY  08/10/2011   Procedure: LYMPH NODE BIOPSY;  Surgeon: Merrie Roof, MD;  Location: [place];  Service: General;  Laterality: Left;  left groin lymph biopsy  . NEUROPLASTY / TRANSPOSITION ULNAR NERVE AT ELBOW Left after 2005  . PERCUTANEOUS CORONARY ROTOBLATOR INTERVENTION (PCI-R) N/A 04/09/2014   Procedure: PERCUTANEOUS CORONARY ROTOBLATOR INTERVENTION (PCI-R);   Surgeon: Burnell Blanks, MD;  Location: [facility] CATH LAB;  Service: Cardiovascular;  Laterality: N/A;  . PERIPHERAL VASCULAR CATHETERIZATION N/A 12/25/2014   Procedure: Upper Extremity Venography;  Surgeon: Conrad Geneva, MD;  Location: [place] CV LAB;  Service: Cardiovascular;  Laterality: N/A;  . PERIPHERAL VASCULAR CATHETERIZATION Left 02/24/2016   Procedure: Fistulagram;  Surgeon: Waynetta Sandy, MD;  Location: [place] CV LAB;  Service: Cardiovascular;  Laterality: Left;  . PERIPHERAL VASCULAR CATHETERIZATION Left 02/24/2016   Procedure: Peripheral Vascular Intervention;  Surgeon: Waynetta Sandy, MD;  Location: [place] CV LAB;  Service: Cardiovascular;  Laterality: Left;  AV FISTULA  . PERITONEAL CATHETER INSERTION    . PERITONEAL CATHETER REMOVAL    . REVISON OF ARTERIOVENOUS FISTULA Right 3/66/2947   Procedure: PLICATION / REVISION OF ARTERIOVENOUS FISTULA;  Surgeon: Angelia Mould, MD;  Location: [place];  Service: Vascular;  Laterality: Right;  . REVISON OF ARTERIOVENOUS FISTULA Left 09/01/2015   Procedure: SUPERFICIALIZATION OF LEFT ARM BRACHIOCEPHALIC ARTERIOVENOUS FISTULA;  Surgeon: Angelia Mould, MD;  Location: [place];  Service: Vascular;  Laterality: Left;  . SHOULDER ARTHROSCOPY Left ~ 2006   frozen  . TOE AMPUTATION Bilateral after 2005  . UMBILICAL HERNIA REPAIR  1990's  . VENOGRAM Right 07/28/2011   Procedure: VENOGRAM;  Surgeon: Conrad Powderly, MD;  Location: [facility] C[facility] LAB;  Service: Cardiovascular;  Laterality: Right;  . VITRECTOMY Bilateral     Current Outpatient Prescriptions  Medication Sig Dispense Refill  . acetaminophen (TYLENOL) 500 MG tablet Take 1,000 mg by mouth every 4 (four) hours as needed for moderate pain or headache.     . albuterol (PROVENTIL HFA;VENTOLIN HFA) 108 (90 Base) MCG/ACT inhaler Inhale 2 puffs into the lungs every 6 (six) hours as needed for wheezing or shortness of breath. 1 Inhaler 6  . allopurinol (ZYLOPRIM)  100 MG tablet Take 200 mg by mouth daily.     [name] Kitchen aspirin EC 81 MG tablet Take 81 mg by mouth at bedtime.    [name] Kitchen atorvastatin (LIPITOR) 80 MG tablet Take 1 tablet (80 mg total) by mouth daily at 6 PM. 30 tablet 0  . AURYXIA 1 GM 210 MG(Fe) tablet Take 417ms with meals and 2187m with snacks  6  . calcium acetate (PHOSLO) 667 MG capsule Take 133458mwith meals and 667m36mith snacks    . docusate sodium (COLACE) 100 MG capsule Take 200 mg  by mouth 2 (two) times daily.     . fenofibrate (TRICOR) 48 MG tablet Take 96 mg by mouth daily.    . fluticasone (FLONASE) 50 MCG/ACT nasal spray Place 2 sprays into both nostrils daily as needed for allergies.     [name] Kitchen gabapentin (NEURONTIN) 300 MG capsule Take 300 mg by mouth 3 (three) times daily.    [name] Kitchen GLUCAGON EMERGENCY 1 MG injection Inject 1 mg into the muscle daily as needed (low blood sugar).     [name] Kitchen HYDROmorphone (DILAUDID) 2 MG tablet Take 0.5 tablets (1 mg total) by mouth 3 (three) times daily as needed for severe pain. 20 tablet 0  . insulin glargine (LANTUS) 100 UNIT/ML injection Inject 36 Units into the skin at bedtime.    . insulin lispro (HUMALOG) 100 UNIT/ML injection Inject into the skin 3 (three) times daily. Blood glucose - 120, 1 unit for every 20 points over 120    . levothyroxine (SYNTHROID, LEVOTHROID) 112 MCG tablet Take 112 mcg by mouth every evening. 2 hours after dinner    . loratadine (CLARITIN) 10 MG tablet Take 10 mg by mouth daily.    . metoCLOPramide (REGLAN) 10 MG tablet Take 10 mg by mouth 4 (four) times daily -  with meals and at bedtime.    . metoprolol tartrate (LOPRESSOR) 25 MG tablet Take 25 mg by mouth 2 (two) times daily as needed (take as directed for blood pressure).     . midodrine (PROAMATINE) 10 MG tablet Take 1 tablet (10 mg total) by mouth 3 (three) times daily. Takes 1 tab in mornings of dialysis and takes 2 tabs 30 mins before starting dialysis (m,w,f)    . multivitamin (RENA-VIT) TABS tablet Take 1 tablet by mouth daily.     . nitroGLYCERIN (NITROSTAT) 0.4 MG SL tablet Place 1 tablet (0.4 mg total) under the tongue every 5 (five) minutes as needed for chest pain. 25 tablet 4  . Nutritional Supplements (FEEDING SUPPLEMENT, NEPRO CARB STEADY,) LIQD Take 237 mLs by mouth daily. 30 Can 0  . omeprazole (PRILOSEC OTC) 20 MG tablet Take 20 mg by mouth daily.    . OXYGEN Inhale 3 L into the lungs at bedtime.    Vladimir Faster Glycol-Propyl Glycol (SYSTANE) 0.4-0.3 % SOLN Place 1 drop into both eyes as needed (dry eyes).    . polyethylene glycol (MIRALAX / GLYCOLAX) packet Take 17 g by mouth daily. 14 each 0  . prasugrel (EFFIENT) 10 MG TABS tablet Take 10 mg by mouth daily.    . promethazine (PHENERGAN) 25 MG tablet Take 1 tablet (25 mg total) by mouth every 6 (six) hours as needed for nausea or vomiting. For nausea 30 tablet 0  . rOPINIRole (REQUIP) 2 MG tablet Take 2 mg by mouth every evening.     . sodium chloride (MURO 128) 5 % ophthalmic ointment Place 1 application into the left eye at bedtime.    . sodium chloride (MURO 128) 5 % ophthalmic solution Place 1 drop into the left eye daily.    . Wound Cleansers (WOUND [place]) LIQD Apply 1 application topically daily as needed (wound care).     No current facility-administered medications for this visit.    Facility-Administered Medications Ordered in Other Visits  Medication Dose Route Frequency Provider Last Rate Last Dose  . 0.9 %  sodium chloride infusion   Intravenous Continuous Angelia Mould, MD      . Chlorhexidine Gluconate Cloth 2 % PADS 6 each  6  each Topical Once Angelia Mould, MD        Allergies  Allergen Reactions  . Prednisone Other (See Comments)    Increases sugar levels  . Adhesive [Tape] Itching and Other (See Comments)    Blisters on arm from adhesive tape at dialysis   . Amoxicillin Itching and Rash    Has patient had a PCN reaction causing immediate rash, facial/tongue/throat swelling, SOB or lightheadedness with hypotension:  Yes Has patient had a PCN reaction causing severe rash involving mucus membranes or skin necrosis: No Has patient had a PCN reaction that required hospitalization No Has patient had a PCN reaction occurring within the last 10 years: No If all of the above answers are "NO", then may proceed with Cephalosporin use.  . Clindamycin/Lincomycin Hives and Itching  . Contrast Media [Iodinated Diagnostic Agents] Itching and Rash    Systemic itching and transient rash appearance within hours of receiving contrast    . Enalapril Rash and Cough  . Mobic [Meloxicam] Hives and Rash  . Morphine Hives, Nausea Only and Rash  . Brilinta [Ticagrelor] Nausea And Vomiting  . Ciprofloxacin Itching and Rash  . Clopidogrel Rash  . Codeine Hives, Itching and Rash  . Doxycycline Rash  . Hydrocodone Rash  . Levofloxacin Hives and Itching  . Oxycodone Rash  . Rocephin [Ceftriaxone] Itching    Social History   Social History  . Marital status: Married    Spouse name: N/A  . Number of children: N/A  . Years of education: N/A   Occupational History  . Disabled    Social History Main Topics  . Smoking status: Never Smoker  . Smokeless tobacco: Never Used  . Alcohol use Yes     Comment: occasional  . Drug use: No  . Sexual activity: Not on file   Other Topics Concern  . Not on file   Social History Narrative   Lives @ home with his wife in [place].     Family History  Problem Relation Age of Onset  . Colon polyps Father   . Diabetes Father   . Hypertension Father   . Hypertension Mother   . Diabetes Mother   . Hyperlipidemia Mother   . Breast cancer Maternal Aunt   . Bone cancer Maternal Aunt   . Liver cancer Maternal Aunt   . Malignant hyperthermia Neg Hx   . Heart attack Neg Hx   . Stroke Neg Hx   . Colon cancer Neg Hx   . Stomach cancer Neg Hx   . Pancreatic cancer Neg Hx   . Esophageal cancer Neg Hx     Review of Systems:  As stated in the HPI and otherwise negative.    BP (!) 100/50   Pulse 83   Ht _0  (1.727 m)   Wt 286 lb 6.4 oz (129.9 kg)   SpO2 96%   BMI 43.55 kg/m   Physical Examination: General: Well developed, well nourished, NAD  HEENT: OP clear, mucus membranes moist  SKIN: warm, dry. No rashes. Neuro: No focal deficits  Musculoskeletal: Muscle strength 5/5 all ext  Psychiatric: Mood and affect normal  Neck: No JVD, no carotid bruits, no thyromegaly, no lymphadenopathy.  Lungs:Clear bilaterally, no wheezes, rhonci, crackles Cardiovascular: Regular rate and rhythm. Subtle systolic murmur. No gallops or rubs. Abdomen:Soft. Bowel sounds present. Non-tender.  Extremities: No lower extremity edema. Left LE amputation.   Echo 02/03/16: - Procedure narrative: Transthoracic echocardiography. Image   quality was suboptimal. The study  was technically difficult.   Intravenous contrast (Definity) was administered. - Left ventricle: The cavity size was normal. Wall thickness was   increased in a pattern of mild LVH. Systolic function was normal.   The estimated ejection fraction was in the range of 55% to 60%.   Wall motion was normal; there were no regional wall motion   abnormalities. Features are consistent with a pseudonormal left   ventricular filling pattern, with concomitant abnormal relaxation   and increased filling pressure (grade 2 diastolic dysfunction). - Mitral valve: Calcified annulus. - Left atrium: The atrium was mildly dilated.  Cardiac cath Sept 2017:  Conclusion 01/01/16   Ost LAD lesion, 30 %stenosed.  Dist LAD lesion, 30 %stenosed.  Prox RCA to Mid RCA lesion, 0 %stenosed.  1st RPLB lesion, 50 %stenosed.  A STENT XIENCE ALPINE RX 9.02I09 drug eluting stent was successfully placed.  1st Diag lesion, 95 %stenosed.  Post intervention, there is a 0% residual stenosis.  Dist RCA lesion, 30 %stenosed.  Two-vessel coronary obstructive disease with mild calcification of the proximal LAD with 30% narrowing,  progressive 95% mid diagonal stenosis, 30% mid LAD narrowing; normal left circumflex coronary artery; and calcified large RCA with a widely patent stent in the mid RCA at the site of previous atherectomy, 30% acute margin narrowing, and 50% inferior branch PDA stenosis.  Successful PCI to the diagonal vessel with ultimate insertion of a 2.2515 mm Xience Alpine DES stent postdilated with a 2.4 mm with the 95% stenosis being reduced to 0%.    EKG:  EKG is ordered today. The ekg ordered today demonstrates NSR, rate 83 bpm.   Recent Labs: 01/04/2016: Magnesium 2.1 02/02/2016: B Natriuretic Peptide 1,185.6; TSH 4.840 02/26/2016: ALT 35 04/13/2016: Platelets 272 07/26/2016: BUN 50; Creatinine, Ser 7.10; Hemoglobin 13.3; Potassium 4.4; Sodium 140   Lipid Panel    Component Value Date/Time   CHOL 143 02/25/2015 1400   TRIG 255 (H) 02/25/2015 1400   HDL 29 (L) 02/25/2015 1400   CHOLHDL 4.9 02/25/2015 1400   VLDL 51 (H) 02/25/2015 1400   LDLCALC 63 02/25/2015 1400     Wt Readings from Last 3 Encounters:  08/05/16 286 lb 6.4 oz (129.9 kg)  07/26/16 265 lb (120.2 kg)  04/26/16 268 lb (121.6 kg)     Other studies Reviewed: Additional studies/ records that were reviewed today include: . Review of the above records demonstrates:   Assessment and Plan:   1. CAD without angina: He is having no chest pain suggestive of angina. He has had drug eluting stents placed in the RCA and Diagonal branches. He has mild disease in the LAD and circumflex arteries. He appears to be stable at this time. I think he can proceed with his planned surgical procedure. Stable following recent PCI of the RCA with DES x 1. Continue ASA, Effient, statin, Imdur, beta blocker. Dyspnea persists. I think his dyspnea is most likely non-cardiac. I think it would be best to try and drive down his dry weight with HD. He will discuss with Nephrology.   2. ESRD on HD: Per Nephrology  3. Pre-operative cardiovascular examination:  He has stable CAD. No chest pain suggestive of angina. No signs of CHF. No arrhythmias. EKG is normal. He can proceed with his planned surgical procedure without further cardiac workup.  Current medicines are reviewed at length with the patient today.  The patient does not have concerns regarding medicines.  The following changes have been made:  no change  Labs/  tests ordered today include:   Orders Placed This Encounter  Procedures  . EKG 12-Lead     Disposition:   FU with me in 6 months   Signed, Lauree Chandler, MD 08/05/2016 12:57 PM    [address], [place], [place]  [address] Phone: [phone]; Fax: [phone]

## 2016-08-08 NOTE — Progress Notes (Signed)
I spoke with patient's wife, Joelene Millin.  I instructed Joelene Millin to have patient take 28 units of Lantus tonight, 80% of normal schedule.  Joelene Millin reports that patient has [place] if CBG is low in am.

## 2016-08-08 NOTE — Anesthesia Preprocedure Evaluation (Addendum)
Anesthesia Evaluation  Patient identified by MRN, date of birth, ID band Patient awake    Reviewed: Allergy & Precautions, NPO status , Patient's Chart, lab work & pertinent test results  History of Anesthesia Complications (+) PONV, DIFFICULT AIRWAY and history of anesthetic complications ('[facility])  Airway Mallampati: III  TM Distance: >3 FB Neck ROM: Full    Dental  (+) Dental Advisory Given   Pulmonary sleep apnea and Oxygen sleep apnea , COPD,  oxygen dependent,    breath sounds clear to auscultation       Cardiovascular hypertension, Pt. on medications (-) angina+ CAD, + Past MI, + Cardiac Stents (DES 12/2015) and + Peripheral Vascular Disease (s/p BKA)   Rhythm:Regular Rate:Normal  10/17 ECHO: EF 55-60%, valves ok   Neuro/Psych Anxiety negative neurological ROS     GI/Hepatic Neg liver ROS, GERD  Medicated and Controlled,  Endo/Other  diabetes (glu 154), Oral Hypoglycemic Agents, Insulin DependentHypothyroidism Morbid obesity  Renal/GU ESRF and DialysisRenal disease (MWF, K+ 4.6)     Musculoskeletal  (+) Arthritis ,   Abdominal (+) + obese,   Peds  Hematology negative hematology ROS (+)   Anesthesia Other Findings   Reproductive/Obstetrics                            Anesthesia Physical Anesthesia Plan  ASA: III  Anesthesia Plan: General   Post-op Pain Management:    Induction: Intravenous  Airway Management Planned: LMA  Additional Equipment:   Intra-op Plan:   Post-operative Plan:   Informed Consent: I have reviewed the patients History and Physical, chart, labs and discussed the procedure including the risks, benefits and alternatives for the proposed anesthesia with the patient or authorized representative who has indicated his/her understanding and acceptance.   Dental advisory given  Plan Discussed with: CRNA and Surgeon  Anesthesia Plan Comments: (Plan  routine monitors, GA-LMA OK)        Anesthesia Quick Evaluation

## 2016-08-09 ENCOUNTER — Encounter (HOSPITAL_COMMUNITY): Payer: Self-pay | Admitting: Urology

## 2016-08-09 ENCOUNTER — Ambulatory Visit (HOSPITAL_COMMUNITY): Payer: Medicare Other | Admitting: Certified Registered Nurse Anesthetist

## 2016-08-09 ENCOUNTER — Ambulatory Visit (HOSPITAL_COMMUNITY)
Admission: RE | Admit: 2016-08-09 | Discharge: 2016-08-09 | Disposition: A | Discharge status: Home / Self Care | Payer: Medicare Other | Source: Ambulatory Visit | Attending: Vascular Surgery | Admitting: Vascular Surgery

## 2016-08-09 ENCOUNTER — Encounter (HOSPITAL_COMMUNITY)
Admission: RE | Disposition: A | Discharge status: Home / Self Care | Payer: Self-pay | Source: Ambulatory Visit | Attending: Vascular Surgery

## 2016-08-09 DIAGNOSIS — Z955 Presence of coronary angioplasty implant and graft: Secondary | ICD-10-CM | POA: Diagnosis not present

## 2016-08-09 DIAGNOSIS — Z7982 Long term (current) use of aspirin: Secondary | ICD-10-CM | POA: Diagnosis not present

## 2016-08-09 DIAGNOSIS — I252 Old myocardial infarction: Secondary | ICD-10-CM | POA: Insufficient documentation

## 2016-08-09 DIAGNOSIS — E1122 Type 2 diabetes mellitus with diabetic chronic kidney disease: Secondary | ICD-10-CM | POA: Insufficient documentation

## 2016-08-09 DIAGNOSIS — N186 End stage renal disease: Secondary | ICD-10-CM | POA: Diagnosis not present

## 2016-08-09 DIAGNOSIS — K219 Gastro-esophageal reflux disease without esophagitis: Secondary | ICD-10-CM | POA: Insufficient documentation

## 2016-08-09 DIAGNOSIS — I120 Hypertensive chronic kidney disease with stage 5 chronic kidney disease or end stage renal disease: Secondary | ICD-10-CM | POA: Diagnosis not present

## 2016-08-09 DIAGNOSIS — G4733 Obstructive sleep apnea (adult) (pediatric): Secondary | ICD-10-CM | POA: Insufficient documentation

## 2016-08-09 DIAGNOSIS — E1143 Type 2 diabetes mellitus with diabetic autonomic (poly)neuropathy: Secondary | ICD-10-CM | POA: Insufficient documentation

## 2016-08-09 DIAGNOSIS — J449 Chronic obstructive pulmonary disease, unspecified: Secondary | ICD-10-CM | POA: Insufficient documentation

## 2016-08-09 DIAGNOSIS — Y832 Surgical operation with anastomosis, bypass or graft as the cause of abnormal reaction of the patient, or of later complication, without mention of misadventure at the time of the procedure: Secondary | ICD-10-CM | POA: Insufficient documentation

## 2016-08-09 DIAGNOSIS — Z940 Kidney transplant status: Secondary | ICD-10-CM | POA: Diagnosis not present

## 2016-08-09 DIAGNOSIS — I2510 Atherosclerotic heart disease of native coronary artery without angina pectoris: Secondary | ICD-10-CM | POA: Insufficient documentation

## 2016-08-09 DIAGNOSIS — Z7951 Long term (current) use of inhaled steroids: Secondary | ICD-10-CM | POA: Diagnosis not present

## 2016-08-09 DIAGNOSIS — Z89422 Acquired absence of other left toe(s): Secondary | ICD-10-CM | POA: Diagnosis not present

## 2016-08-09 DIAGNOSIS — Z9981 Dependence on supplemental oxygen: Secondary | ICD-10-CM | POA: Insufficient documentation

## 2016-08-09 DIAGNOSIS — Z89421 Acquired absence of other right toe(s): Secondary | ICD-10-CM | POA: Diagnosis not present

## 2016-08-09 DIAGNOSIS — Z992 Dependence on renal dialysis: Secondary | ICD-10-CM | POA: Insufficient documentation

## 2016-08-09 DIAGNOSIS — T82858A Stenosis of vascular prosthetic devices, implants and grafts, initial encounter: Secondary | ICD-10-CM | POA: Insufficient documentation

## 2016-08-09 DIAGNOSIS — K3184 Gastroparesis: Secondary | ICD-10-CM | POA: Diagnosis not present

## 2016-08-09 DIAGNOSIS — E039 Hypothyroidism, unspecified: Secondary | ICD-10-CM | POA: Diagnosis not present

## 2016-08-09 DIAGNOSIS — E785 Hyperlipidemia, unspecified: Secondary | ICD-10-CM | POA: Diagnosis not present

## 2016-08-09 DIAGNOSIS — Z794 Long term (current) use of insulin: Secondary | ICD-10-CM | POA: Insufficient documentation

## 2016-08-09 DIAGNOSIS — Z6841 Body Mass Index (BMI) 40.0 and over, adult: Secondary | ICD-10-CM | POA: Insufficient documentation

## 2016-08-09 DIAGNOSIS — Z89512 Acquired absence of left leg below knee: Secondary | ICD-10-CM | POA: Insufficient documentation

## 2016-08-09 DIAGNOSIS — E1151 Type 2 diabetes mellitus with diabetic peripheral angiopathy without gangrene: Secondary | ICD-10-CM | POA: Diagnosis not present

## 2016-08-09 DIAGNOSIS — E119 Type 2 diabetes mellitus without complications: Secondary | ICD-10-CM | POA: Diagnosis not present

## 2016-08-09 DIAGNOSIS — Z79899 Other long term (current) drug therapy: Secondary | ICD-10-CM | POA: Insufficient documentation

## 2016-08-09 DIAGNOSIS — M109 Gout, unspecified: Secondary | ICD-10-CM | POA: Insufficient documentation

## 2016-08-09 HISTORY — PX: PATCH ANGIOPLASTY: SHX6230

## 2016-08-09 HISTORY — PX: REVISON OF ARTERIOVENOUS FISTULA: SHX6074

## 2016-08-09 LAB — GLUCOSE, CAPILLARY: Glucose-Capillary: 178 mg/dL — ABNORMAL HIGH (ref 65–99)

## 2016-08-09 LAB — POCT I-STAT 4, (NA,K, GLUC, HGB,HCT)
Glucose, Bld: 154 mg/dL — ABNORMAL HIGH (ref 65–99)
HCT: 39 % (ref 39.0–52.0)
Hemoglobin: 13.3 g/dL (ref 13.0–17.0)
Potassium: 4.6 mmol/L (ref 3.5–5.1)
Sodium: 139 mmol/L (ref 135–145)

## 2016-08-09 LAB — SURGICAL PCR SCREEN
MRSA, PCR: NEGATIVE
Staphylococcus aureus: POSITIVE — AB

## 2016-08-09 SURGERY — REVISON OF ARTERIOVENOUS FISTULA
Anesthesia: General | Site: Arm Upper | Laterality: Left

## 2016-08-09 MED ORDER — SODIUM CHLORIDE 0.9 % IV SOLN: INTRAVENOUS | Status: DC

## 2016-08-09 MED ORDER — SODIUM CHLORIDE 0.9 % IV SOLN
INTRAVENOUS | Status: DC | PRN
  Administered 2016-08-09: 07:00:00

## 2016-08-09 MED ORDER — MUPIROCIN 2 % EX OINT
TOPICAL_OINTMENT | CUTANEOUS | Status: AC
  Administered 2016-08-09: 1 via TOPICAL
  Filled 2016-08-09: qty 22

## 2016-08-09 MED ORDER — MIDAZOLAM HCL 2 MG/2ML IJ SOLN: 0.5000 mg | Freq: Once | INTRAMUSCULAR | Status: DC | PRN

## 2016-08-09 MED ORDER — ALBUMIN HUMAN 5 % IV SOLN
12.5000 g | Freq: Once | INTRAVENOUS | Status: AC
  Administered 2016-08-09: 12.5 g via INTRAVENOUS

## 2016-08-09 MED ORDER — ALBUMIN HUMAN 5 % IV SOLN
INTRAVENOUS | Status: AC
  Filled 2016-08-09: qty 250

## 2016-08-09 MED ORDER — LIDOCAINE-EPINEPHRINE (PF) 1 %-1:200000 IJ SOLN
INTRAMUSCULAR | Status: AC
  Filled 2016-08-09: qty 30

## 2016-08-09 MED ORDER — 0.9 % SODIUM CHLORIDE (POUR BTL) OPTIME
TOPICAL | Status: DC | PRN
  Administered 2016-08-09: 1000 mL

## 2016-08-09 MED ORDER — ONDANSETRON HCL 4 MG/2ML IJ SOLN
INTRAMUSCULAR | Status: DC | PRN
  Administered 2016-08-09: 4 mg via INTRAVENOUS

## 2016-08-09 MED ORDER — MEPERIDINE HCL 25 MG/ML IJ SOLN: 6.2500 mg | INTRAMUSCULAR | Status: DC | PRN

## 2016-08-09 MED ORDER — MIDAZOLAM HCL 5 MG/5ML IJ SOLN
INTRAMUSCULAR | Status: DC | PRN
Start: 2016-08-09 — End: 2016-08-09
  Administered 2016-08-09 (×2): 1 mg via INTRAVENOUS

## 2016-08-09 MED ORDER — PROMETHAZINE HCL 25 MG/ML IJ SOLN
6.2500 mg | INTRAMUSCULAR | Status: DC | PRN
Start: 2016-08-09 — End: 2016-08-09

## 2016-08-09 MED ORDER — PROPOFOL 10 MG/ML IV BOLUS
INTRAVENOUS | Status: AC
  Filled 2016-08-09: qty 20

## 2016-08-09 MED ORDER — FENTANYL CITRATE (PF) 100 MCG/2ML IJ SOLN
INTRAMUSCULAR | Status: DC | PRN
  Administered 2016-08-09 (×5): 50 ug via INTRAVENOUS

## 2016-08-09 MED ORDER — LIDOCAINE 2% (20 MG/ML) 5 ML SYRINGE
INTRAMUSCULAR | Status: AC
  Filled 2016-08-09: qty 10

## 2016-08-09 MED ORDER — LIDOCAINE HCL (CARDIAC) 20 MG/ML IV SOLN
INTRAVENOUS | Status: DC | PRN
  Administered 2016-08-09: 40 mg via INTRAVENOUS

## 2016-08-09 MED ORDER — FENTANYL CITRATE (PF) 250 MCG/5ML IJ SOLN
INTRAMUSCULAR | Status: AC
  Filled 2016-08-09: qty 5

## 2016-08-09 MED ORDER — DEXTROSE 5 % IV SOLN
INTRAVENOUS | Status: DC | PRN
  Administered 2016-08-09: 07:00:00 via INTRAVENOUS

## 2016-08-09 MED ORDER — FENTANYL CITRATE (PF) 100 MCG/2ML IJ SOLN
25.0000 ug | INTRAMUSCULAR | Status: DC | PRN
  Administered 2016-08-09: 25 ug via INTRAVENOUS

## 2016-08-09 MED ORDER — VANCOMYCIN HCL IN DEXTROSE 1-5 GM/200ML-% IV SOLN
1000.0000 mg | Freq: Once | INTRAVENOUS | Status: AC
  Administered 2016-08-09: 1000 mg via INTRAVENOUS

## 2016-08-09 MED ORDER — HEMOSTATIC AGENTS (NO CHARGE) OPTIME
TOPICAL | Status: DC | PRN
  Administered 2016-08-09: 1 via TOPICAL

## 2016-08-09 MED ORDER — CHLORHEXIDINE GLUCONATE CLOTH 2 % EX PADS: 6.0000 | MEDICATED_PAD | Freq: Once | CUTANEOUS | Status: DC

## 2016-08-09 MED ORDER — SODIUM CHLORIDE 0.9 % IV SOLN
INTRAVENOUS | Status: DC | PRN
  Administered 2016-08-09: 07:00:00 via INTRAVENOUS

## 2016-08-09 MED ORDER — ONDANSETRON HCL 4 MG/2ML IJ SOLN
INTRAMUSCULAR | Status: AC
  Filled 2016-08-09: qty 2

## 2016-08-09 MED ORDER — MUPIROCIN 2 % EX OINT
1.0000 "application " | TOPICAL_OINTMENT | Freq: Once | CUTANEOUS | Status: AC
  Administered 2016-08-09: 1 via TOPICAL

## 2016-08-09 MED ORDER — MIDAZOLAM HCL 2 MG/2ML IJ SOLN
INTRAMUSCULAR | Status: AC
  Filled 2016-08-09: qty 2

## 2016-08-09 MED ORDER — PROPOFOL 10 MG/ML IV BOLUS
INTRAVENOUS | Status: DC | PRN
  Administered 2016-08-09: 200 mg via INTRAVENOUS
  Administered 2016-08-09: 50 mg via INTRAVENOUS

## 2016-08-09 MED ORDER — PHENYLEPHRINE HCL 10 MG/ML IJ SOLN
INTRAVENOUS | Status: DC | PRN
  Administered 2016-08-09: 20 ug/min via INTRAVENOUS

## 2016-08-09 MED ORDER — TRAMADOL HCL 50 MG PO TABS: 50.0000 mg | ORAL_TABLET | Freq: Four times a day (QID) | ORAL | 0 refills | Status: DC | PRN

## 2016-08-09 MED ORDER — VANCOMYCIN HCL IN DEXTROSE 1-5 GM/200ML-% IV SOLN
INTRAVENOUS | Status: AC
  Filled 2016-08-09: qty 200

## 2016-08-09 MED ORDER — FENTANYL CITRATE (PF) 100 MCG/2ML IJ SOLN
INTRAMUSCULAR | Status: AC
  Filled 2016-08-09: qty 2

## 2016-08-09 MED ORDER — VANCOMYCIN HCL 10 G IV SOLR
1500.0000 mg | Freq: Once | INTRAVENOUS | Status: DC
  Filled 2016-08-09: qty 1500

## 2016-08-09 SURGICAL SUPPLY — 30 items
ADH SKN CLS APL DERMABOND .7 (GAUZE/BANDAGES/DRESSINGS) ×1
ARMBAND PINK RESTRICT EXTREMIT (MISCELLANEOUS) ×2 IMPLANT
CANISTER SUCT 3000ML PPV (MISCELLANEOUS) ×2 IMPLANT
CLIP TI MEDIUM 6 (CLIP) ×2 IMPLANT
CLIP TI WIDE RED SMALL 6 (CLIP) ×2 IMPLANT
COVER PROBE W GEL 5X96 (DRAPES) ×2 IMPLANT
DERMABOND ADVANCED (GAUZE/BANDAGES/DRESSINGS) ×1
DERMABOND ADVANCED .7 DNX12 (GAUZE/BANDAGES/DRESSINGS) ×1 IMPLANT
ELECT REM PT RETURN 9FT ADLT (ELECTROSURGICAL) ×2
ELECTRODE REM PT RTRN 9FT ADLT (ELECTROSURGICAL) ×1 IMPLANT
GLOVE BIO SURGEON STRL SZ7.5 (GLOVE) ×2 IMPLANT
GOWN STRL REUS W/ TWL LRG LVL3 (GOWN DISPOSABLE) ×2 IMPLANT
GOWN STRL REUS W/ TWL XL LVL3 (GOWN DISPOSABLE) ×1 IMPLANT
GOWN STRL REUS W/TWL LRG LVL3 (GOWN DISPOSABLE) ×6
GOWN STRL REUS W/TWL XL LVL3 (GOWN DISPOSABLE) ×2
HEMOSTAT SNOW SURGICEL 2X4 (HEMOSTASIS) ×2 IMPLANT
KIT BASIN OR (CUSTOM PROCEDURE TRAY) ×2 IMPLANT
KIT ROOM TURNOVER OR (KITS) ×2 IMPLANT
NS IRRIG 1000ML POUR BTL (IV SOLUTION) ×2 IMPLANT
PACK CV ACCESS (CUSTOM PROCEDURE TRAY) ×2 IMPLANT
PAD ARMBOARD 7.5X6 YLW CONV (MISCELLANEOUS) ×4 IMPLANT
PATCH VASC XENOSURE 1CMX6CM (Vascular Products) ×2 IMPLANT
PATCH VASC XENOSURE 1X6 (Vascular Products) IMPLANT
SUT MNCRL AB 4-0 PS2 18 (SUTURE) ×2 IMPLANT
SUT PROLENE 5 0 C 1 24 (SUTURE) ×1 IMPLANT
SUT PROLENE 6 0 BV (SUTURE) ×3 IMPLANT
SUT VIC AB 3-0 SH 27 (SUTURE) ×2
SUT VIC AB 3-0 SH 27X BRD (SUTURE) ×1 IMPLANT
UNDERPAD 30X30 (UNDERPADS AND DIAPERS) ×2 IMPLANT
WATER STERILE IRR 1000ML POUR (IV SOLUTION) ×2 IMPLANT

## 2016-08-09 NOTE — Transfer of Care (Signed)
Immediate Anesthesia Transfer of Care Note  Patient: Dale [name]  Procedure(s) Performed: Procedure(s): REVISION OF ARTERIAL ANASTOMOSIS OF ARTERIOVENOUS FISTULA (Left) LEFT  ARTERIOVENOUS FISTULA PATCH ANGIOPLASTY (Left)  Patient Location: PACU  Anesthesia Type:General  Level of Consciousness: awake, oriented, sedated, patient cooperative and responds to stimulation  Airway & Oxygen Therapy: Patient Spontanous Breathing and Patient connected to nasal cannula oxygen  Post-op Assessment: Report given to RN, Post -op Vital signs reviewed and stable, Patient moving all extremities and Patient moving all extremities X 4  Post vital signs: Reviewed and stable  Last Vitals:  Vitals:   08/09/16 0634 08/09/16 0908  BP: (!) 94/45 (!) (P) 94/53  Pulse: 81 82  Resp: 20 (!) 5  Temp: 36.4 C     Last Pain:  Vitals:   08/09/16 0908  PainSc: (P) 0-No pain      Patients Stated Pain Goal: 3 (23/36/12 2449)  Complications: No apparent anesthesia complications

## 2016-08-09 NOTE — H&P (Signed)
HP    History of Present Illness: This is a 51 y.o. male with esrd on hd via left arm avf. Recently underwent fistulogram that demonstrated stenosis/kink near arterial anastomosis. Has undergone cardiac clearance for procedure.   Past Medical History:  Diagnosis Date  . Anemia of chronic disease   . Arthritis    "hands, right knee" (03/19/2014) no cartlidge in right knee  . CAD (coronary artery disease)    a. Abnl nuc ->LHC (11/15):  mid to dist Dx 80%, mid RCA 99% (functional CTO), dist RCA 80% >> PCI: balloon angioplasty to mid RCA (could not deliver stent) - staged PCI Rotoblator atherectomy/DES to [facility]. b. NSTEMI 12/2015 s/p DES to diagonal.  . Cholecystitis    a. 08/27/2011  . Chronic diastolic CHF (congestive heart failure) ([place])    a. Echo 3/13:  EF 55-60%;  b. Echo (11/15):  Mild LVH, EF 60-65%, no RWMA, Gr 1 DD, MAC, mild LAE, normal RVF, mild RAE;  c.  Echo 5/16:  severe LVH, EF 55-60%, no RWMA, Gr 1 DD, MAC, mild LAE  . Claustrophobia    when things get around his face.   . Complication of anesthesia   . Difficult intubation    only once in 2015 at [facility] haven't had a problem since then  . ESRD (end stage renal disease) ([place])    a. 1995 s/p cadaveric transplant w/ susbequent failure after 18 yrs;  b.Dialysis initiated 07/2011 [facility] M-W-F  . Fibromyalgia   . Gastroparesis   . GERD (gastroesophageal reflux disease)   . Gout    PMH  . History of blood transfusion   . HLD (hyperlipidemia)   . Hypertension   . Hypothyroidism   . Inguinal lymphadenopathy    a. bilateral - s/p biopsy 07/2011  . Insulin dependent diabetes mellitus (HCC)    Type I  . Morbid obesity ([place])   . OSA (obstructive sleep apnea)    adjustable bed  . PAD (peripheral artery disease) (HCC)    a. s/p L BKA.  [name] Kitchen Pericardial effusion    a.  Small by CT 08/22/11;  b.  Large by CT 08/27/11. c. Not seen on 11/2015 echo.  . Pneumonia ~ 2007  . PONV (postoperative nausea and vomiting)    vomited once  after a procedure.  . Tracheobronchomalacia     Past Surgical History:  Procedure Laterality Date  . A/V SHUNTOGRAM Left 07/26/2016   Procedure: A/V Fistulagram;  Surgeon: Waynetta Sandy, MD;  Location: [place] CV LAB;  Service: Cardiovascular;  Laterality: Left;  . AMPUTATION OF REPLICATED TOES Right   . ANGIOPLASTY  04/09/2014   RCA   . APPENDECTOMY  ~ 2006  . ARTERIOVENOUS GRAFT PLACEMENT Left 1993?   forearm  . ARTERIOVENOUS GRAFT PLACEMENT Right 1993?   leg  . ARTERIOVENOUS GRAFT PLACEMENT Left    Thigh  . Arteriovenous Graft Removed Left    Thigh  . AV FISTULA PLACEMENT  07/29/2011   Procedure: ARTERIOVENOUS (AV) FISTULA CREATION;  Surgeon: Mal Misty, MD;  Location: [place];  Service: Vascular;  Laterality: Right;  Brachial cephalic  . AV FISTULA PLACEMENT Left 01/20/2015   Procedure: LIGATION OF RIGHT ARTERIOVENOUS FISTULA WITH EXCISION OF ANEURYSM;  Surgeon: Angelia Mould, MD;  Location: [place];  Service: Vascular;  Laterality: Left;  . AV FISTULA PLACEMENT Left 04/30/2015   Procedure: CREATION OF LEFT ARM BRACHIO-CEPHALIC ARTERIOVENOUS (AV) FISTULA ;  Surgeon: Angelia Mould, MD;  Location: [facility]  OR;  Service: Vascular;  Laterality: Left;  . AV Graft PLACEMENT Left 1993?   "attempted one in my wrist; didn't take"  . BELOW KNEE LEG AMPUTATION Left 2010  . CARDIAC CATHETERIZATION  03/19/2014  . CARDIAC CATHETERIZATION  03/19/2014   Procedure: CORONARY BALLOON ANGIOPLASTY;  Surgeon: Burnell Blanks, MD;  Location: [facility] CATH LAB;  Service: Cardiovascular;;  . CARDIAC CATHETERIZATION N/A 01/01/2016   Procedure: Left Heart Cath and Coronary Angiography;  Surgeon: Troy Sine, MD;  Location: [place] CV LAB;  Service: Cardiovascular;  Laterality: N/A;  . CARDIAC CATHETERIZATION N/A 01/01/2016   Procedure: Coronary Stent Intervention;  Surgeon: Troy Sine, MD;  Location: [place] CV LAB;  Service: Cardiovascular;  Laterality: N/A;  . CARPAL  TUNNEL RELEASE Bilateral after 2005  . CATARACT EXTRACTION W/ INTRAOCULAR LENS  IMPLANT, BILATERAL Bilateral 1990's  . COLONOSCOPY  08/29/2011   Procedure: COLONOSCOPY;  Surgeon: Jerene Bears, MD;  Location: [place];  Service: Gastroenterology;  Laterality: N/A;  . COLONOSCOPY WITH PROPOFOL N/A 04/26/2016   Procedure: COLONOSCOPY WITH PROPOFOL;  Surgeon: Jerene Bears, MD;  Location: [facility] ENDOSCOPY;  Service: Gastroenterology;  Laterality: N/A;  . CORONARY ANGIOPLASTY WITH STENT PLACEMENT  04/09/2014       ptca/des mid lad   . ESOPHAGOGASTRODUODENOSCOPY N/A 12/25/2015   Procedure: ESOPHAGOGASTRODUODENOSCOPY (EGD);  Surgeon: Mauri Pole, MD;  Location: [facility] ENDOSCOPY;  Service: Endoscopy;  Laterality: N/A;  bedside  . ESOPHAGOGASTRODUODENOSCOPY (EGD) WITH PROPOFOL N/A 04/26/2016   Procedure: ESOPHAGOGASTRODUODENOSCOPY (EGD) WITH PROPOFOL;  Surgeon: Jerene Bears, MD;  Location: [facility] ENDOSCOPY;  Service: Gastroenterology;  Laterality: N/A;  . EYE SURGERY Bilateral    cataract  . FEMORAL ARTERY REPAIR  04/09/2014   ANGIOSEAL  . INSERTION OF DIALYSIS CATHETER  08/01/2011   Procedure: INSERTION OF DIALYSIS CATHETER;  Surgeon: Rosetta Posner, MD;  Location: [place];  Service: Vascular;  Laterality: Right;  insertion of dialysis catheter on right internal jugular vein  . INSERTION OF DIALYSIS CATHETER Right 01/20/2015   Procedure: INSERTION OF DIALYSIS CATHETER - RIGHT INTERNAL JUGULAR ;  Surgeon: Angelia Mould, MD;  Location: [place];  Service: Vascular;  Laterality: Right;  . KIDNEY TRANSPLANT  04/25/1993  . LEFT HEART CATHETERIZATION WITH CORONARY ANGIOGRAM N/A 03/19/2014   Procedure: LEFT HEART CATHETERIZATION WITH CORONARY ANGIOGRAM;  Surgeon: Burnell Blanks, MD;  Location: [facility] CATH LAB;  Service: Cardiovascular;  Laterality: N/A;  . LYMPH NODE BIOPSY  08/10/2011   Procedure: LYMPH NODE BIOPSY;  Surgeon: Merrie Roof, MD;  Location: [place];  Service: General;  Laterality: Left;  left groin  lymph biopsy  . NEUROPLASTY / TRANSPOSITION ULNAR NERVE AT ELBOW Left after 2005  . PERCUTANEOUS CORONARY ROTOBLATOR INTERVENTION (PCI-R) N/A 04/09/2014   Procedure: PERCUTANEOUS CORONARY ROTOBLATOR INTERVENTION (PCI-R);  Surgeon: Burnell Blanks, MD;  Location: [facility] CATH LAB;  Service: Cardiovascular;  Laterality: N/A;  . PERIPHERAL VASCULAR CATHETERIZATION N/A 12/25/2014   Procedure: Upper Extremity Venography;  Surgeon: Conrad Marrero, MD;  Location: [place] CV LAB;  Service: Cardiovascular;  Laterality: N/A;  . PERIPHERAL VASCULAR CATHETERIZATION Left 02/24/2016   Procedure: Fistulagram;  Surgeon: Waynetta Sandy, MD;  Location: [place] CV LAB;  Service: Cardiovascular;  Laterality: Left;  . PERIPHERAL VASCULAR CATHETERIZATION Left 02/24/2016   Procedure: Peripheral Vascular Intervention;  Surgeon: Waynetta Sandy, MD;  Location: [place] CV LAB;  Service: Cardiovascular;  Laterality: Left;  AV FISTULA  . PERITONEAL CATHETER INSERTION    .  PERITONEAL CATHETER REMOVAL    . REVISON OF ARTERIOVENOUS FISTULA Right 1/63/8466   Procedure: PLICATION / REVISION OF ARTERIOVENOUS FISTULA;  Surgeon: Angelia Mould, MD;  Location: [place];  Service: Vascular;  Laterality: Right;  . REVISON OF ARTERIOVENOUS FISTULA Left 09/01/2015   Procedure: SUPERFICIALIZATION OF LEFT ARM BRACHIOCEPHALIC ARTERIOVENOUS FISTULA;  Surgeon: Angelia Mould, MD;  Location: [place];  Service: Vascular;  Laterality: Left;  . SHOULDER ARTHROSCOPY Left ~ 2006   frozen  . TOE AMPUTATION Bilateral after 2005  . UMBILICAL HERNIA REPAIR  1990's  . VENOGRAM Right 07/28/2011   Procedure: VENOGRAM;  Surgeon: Conrad LaSalle, MD;  Location: [facility] CATH LAB;  Service: Cardiovascular;  Laterality: Right;  . VITRECTOMY Bilateral     Allergies  Allergen Reactions  . Prednisone Other (See Comments)    Increases sugar levels  . Adhesive [Tape] Itching and Other (See Comments)    Blisters on arm from  adhesive tape at dialysis   . Amoxicillin Itching and Rash    Has patient had a PCN reaction causing immediate rash, facial/tongue/throat swelling, SOB or lightheadedness with hypotension: Yes Has patient had a PCN reaction causing severe rash involving mucus membranes or skin necrosis: No Has patient had a PCN reaction that required hospitalization No Has patient had a PCN reaction occurring within the last 10 years: No If all of the above answers are "NO", then may proceed with Cephalosporin use.  . Clindamycin/Lincomycin Hives and Itching  . Contrast Media [Iodinated Diagnostic Agents] Itching and Rash    Systemic itching and transient rash appearance within hours of receiving contrast    . Enalapril Rash and Cough  . Mobic [Meloxicam] Hives and Rash  . Morphine Hives, Nausea Only and Rash  . Brilinta [Ticagrelor] Nausea And Vomiting  . Ciprofloxacin Itching and Rash  . Clopidogrel Rash  . Codeine Hives, Itching and Rash  . Doxycycline Rash  . Hydrocodone Rash  . Levofloxacin Hives and Itching  . Oxycodone Rash  . Rocephin [Ceftriaxone] Itching    Prior to Admission medications   Medication Sig Start Date End Date Taking? Authorizing Provider  allopurinol (ZYLOPRIM) 100 MG tablet Take 200 mg by mouth daily.  03/14/12  Yes Historical Provider, MD  aspirin EC 81 MG tablet Take 81 mg by mouth at bedtime.   Yes Historical Provider, MD  atorvastatin (LIPITOR) 80 MG tablet Take 1 tablet (80 mg total) by mouth daily at 6 PM. 01/04/16  Yes Florencia Reasons, MD  calcium acetate (PHOSLO) 667 MG capsule Take 1346ms with meals and 66110m with snacks 09/19/12  Yes Historical Provider, MD  docusate sodium (COLACE) 100 MG capsule Take 200 mg by mouth 2 (two) times daily.    Yes Historical Provider, MD  fenofibrate (TRICOR) 48 MG tablet Take 96 mg by mouth daily.   Yes Historical Provider, MD  fluticasone (FLONASE) 50 MCG/ACT nasal spray Place 2 sprays into both nostrils daily as needed for allergies.     Yes Historical Provider, MD  gabapentin (NEURONTIN) 300 MG capsule Take 300 mg by mouth 3 (three) times daily.   Yes Historical Provider, MD  HYDROmorphone (DILAUDID) 2 MG tablet Take 0.5 tablets (1 mg total) by mouth 3 (three) times daily as needed for severe pain. 04/14/16  Yes DaAlbertine PatriciaMD  hydrOXYzine (ATARAX/VISTARIL) 10 MG tablet Take 10 mg by mouth every 8 (eight) hours as needed.   Yes Historical Provider, MD  insulin glargine (LANTUS) 100 UNIT/ML injection Inject 36 Units into  the skin at bedtime.   Yes Historical Provider, MD  insulin lispro (HUMALOG) 100 UNIT/ML injection Inject into the skin 3 (three) times daily. Blood glucose - 120, 1 unit for every 20 points over 120   Yes Historical Provider, MD  lanthanum (FOSRENOL) 1000 MG chewable tablet Chew 1,000 mg by mouth 3 (three) times daily with meals.   Yes Historical Provider, MD  levothyroxine (SYNTHROID, LEVOTHROID) 112 MCG tablet Take 112 mcg by mouth every evening. 2 hours after dinner   Yes Historical Provider, MD  loratadine (CLARITIN) 10 MG tablet Take 10 mg by mouth daily.   Yes Historical Provider, MD  metoCLOPramide (REGLAN) 10 MG tablet Take 10 mg by mouth 4 (four) times daily -  with meals and at bedtime.   Yes Historical Provider, MD  midodrine (PROAMATINE) 10 MG tablet Take 1 tablet (10 mg total) by mouth 3 (three) times daily. Takes 1 tab in mornings of dialysis and takes 2 tabs 30 mins before starting dialysis (m,w,f) 04/14/16  Yes Albertine Patricia, MD  multivitamin (RENA-VIT) TABS tablet Take 1 tablet by mouth daily.   Yes Historical Provider, MD  Nutritional Supplements (FEEDING SUPPLEMENT, NEPRO CARB STEADY,) LIQD Take 237 mLs by mouth daily. 01/04/16  Yes Florencia Reasons, MD  omeprazole (PRILOSEC OTC) 20 MG tablet Take 20 mg by mouth daily.   Yes Historical Provider, MD  Polyethyl Glycol-Propyl Glycol (SYSTANE) 0.4-0.3 % SOLN Place 1 drop into both eyes as needed (dry eyes).   Yes Historical Provider, MD  prasugrel  (EFFIENT) 10 MG TABS tablet Take 10 mg by mouth daily.   Yes Historical Provider, MD  promethazine (PHENERGAN) 25 MG tablet Take 1 tablet (25 mg total) by mouth every 6 (six) hours as needed for nausea or vomiting. For nausea 11/27/14  Yes Liliane Shi, PA-C  rOPINIRole (REQUIP) 2 MG tablet Take 2 mg by mouth every evening.    Yes Historical Provider, MD  sodium chloride (MURO 128) 5 % ophthalmic ointment Place 1 application into the left eye at bedtime.   Yes Historical Provider, MD  sodium chloride (MURO 128) 5 % ophthalmic solution Place 1 drop into the left eye daily.   Yes Historical Provider, MD  Wound Cleansers (WOUND [place]) LIQD Apply 1 application topically daily as needed (wound care).   Yes Historical Provider, MD  acetaminophen (TYLENOL) 500 MG tablet Take 1,000 mg by mouth every 4 (four) hours as needed for moderate pain or headache.  01/16/16   Historical Provider, MD  albuterol (PROVENTIL HFA;VENTOLIN HFA) 108 (90 Base) MCG/ACT inhaler Inhale 2 puffs into the lungs every 6 (six) hours as needed for wheezing or shortness of breath. 01/26/16   Brand Males, MD  AURYXIA 1 GM 210 MG(Fe) tablet Take 439ms with meals and 2164m with snacks 06/09/16   Historical Provider, MD  GLUCAGON EMERGENCY 1 MG injection Inject 1 mg into the muscle daily as needed (low blood sugar).  01/19/12   Historical Provider, MD  metoprolol tartrate (LOPRESSOR) 25 MG tablet Take 25 mg by mouth 2 (two) times daily as needed (take as directed for blood pressure).     Historical Provider, MD  nitroGLYCERIN (NITROSTAT) 0.4 MG SL tablet Place 1 tablet (0.4 mg total) under the tongue every 5 (five) minutes as needed for chest pain. 02/15/16   ChBurnell BlanksMD  OXYGEN Inhale 3 L into the lungs at bedtime.    Historical Provider, MD  polyethylene glycol (MIRALAX / GLYCOLAX) packet Take 17 g by  mouth daily. 02/06/16   Elmarie Shiley, MD    Social History   Social History  . Marital status: Married    Spouse  name: N/A  . Number of children: N/A  . Years of education: N/A   Occupational History  . Disabled    Social History Main Topics  . Smoking status: Never Smoker  . Smokeless tobacco: Never Used  . Alcohol use Yes     Comment: occasional  . Drug use: No  . Sexual activity: Not on file   Other Topics Concern  . Not on file   Social History Narrative   Lives @ home with his wife in [place].      Family History  Problem Relation Age of Onset  . Colon polyps Father   . Diabetes Father   . Hypertension Father   . Hypertension Mother   . Diabetes Mother   . Hyperlipidemia Mother   . Breast cancer Maternal Aunt   . Bone cancer Maternal Aunt   . Liver cancer Maternal Aunt   . Malignant hyperthermia Neg Hx   . Heart attack Neg Hx   . Stroke Neg Hx   . Colon cancer Neg Hx   . Stomach cancer Neg Hx   . Pancreatic cancer Neg Hx   . Esophageal cancer Neg Hx      Physical Examination  Vitals:   08/09/16 0634  BP: (!) 94/45  Pulse: 81  Resp: 20  Temp: 97.6 F (36.4 C)   There is no height or weight on file to calculate BMI.  HENT: WNL, normocephalic Pulmonary: normal non-labored breathing, without Rales, rhonchi,  wheezing Cardiac: regular Abdomen: soft, NT/ND, no masses Extremities: left arm with thrill Musculoskeletal: no muscle wasting or atrophy  Neurologic: A&O X 3; Appropriate Affect ; SENSATION: normal; MOTOR FUNCTION:  moving all extremities equally. Speech is fluent/normal   CBC    Component Value Date/Time   WBC 7.4 04/13/2016 1458   RBC 3.94 (L) 04/13/2016 1458   HGB 13.3 08/09/2016 0623   HCT 39.0 08/09/2016 0623   PLT 272 04/13/2016 1458   MCV 100.8 (H) 04/13/2016 1458   MCH 29.4 04/13/2016 1458   MCHC 29.2 (L) 04/13/2016 1458   RDW 17.3 (H) 04/13/2016 1458   LYMPHSABS 2.1 04/13/2016 1458   MONOABS 0.5 04/13/2016 1458   EOSABS 0.3 04/13/2016 1458   BASOSABS 0.1 04/13/2016 1458    BMET    Component Value Date/Time   NA 139  08/09/2016 0623   K 4.6 08/09/2016 0623   CL 100 (L) 07/26/2016 0646   CO2 28 04/13/2016 1458   GLUCOSE 154 (H) 08/09/2016 0623   BUN 50 (H) 07/26/2016 0646   CREATININE 7.10 (H) 07/26/2016 0646   CALCIUM 9.0 04/13/2016 1458   GFRNONAA 11 (L) 04/13/2016 1458   GFRAA 12 (L) 04/13/2016 1458    COAGS: Lab Results  Component Value Date   INR 1.26 02/25/2016   INR 1.20 12/21/2015   INR 1.14 11/20/2014         ASSESSMENT/PLAN: This is a 51 y.o. male with esrd on hd via left arm avf. Plan for OR revision today possible tdc placement.   Octaviano Mukai C. Donzetta Matters, MD [facility] [place] Office: [phone] Pager: [phone]

## 2016-08-09 NOTE — Op Note (Signed)
    Patient name: Dale [name] MRN: [id number] DOB: [birth year] Sex: male  08/09/2016 Pre-operative Diagnosis: esrd, stenosis of av fistula left arm Post-operative diagnosis:  Same Surgeon:  Eda Paschal. Donzetta Matters, MD Assistant:  Gerri Lins, PA Procedure Performed: Revision of left arm brachiocephalic av fistula with bovine pericardial patch angioplasty  Indications:  51 year old white male history end-stage renal disease on dialysis via left arm AV fistula. He was having bowel function of this fistula and I performed balloon angioplasty of a stricture near the arterial anastomosis as well as stenting of the cephalic arch. Following this he returned a few months later for repeat evaluation. The stent was noted to be patent however the stricture had returned. He is now indicated for open revision of his AV fistula.  Findings: Ultrasound demonstrated the area of stenosis just following the arterial anastomosis with post-stenotic dilatation of the fistula noted on on open evaluation with intraluminal disease causing the area of stenosis. At completion there was brisk flow through the fistula with Doppler\.    Procedure:  The patient was identified in the holding area and taken to the operating room where he was placed supine on the operating table general anesthesia was induced he was given antibiotics sterilely prepped and draped in his left arm in the usual fashion. A timeout was called and ultrasound was used to identify the area of stricture. A transverse incision was made at the area of his old incision we dissected down onto the fistula. One area of the fistula was damage from previous access site this began to bleed. We then quickly dissected out proximally and distally and were able to clamp the fistula. Proximal and distal areas of the fistula were then flushed with heparinized saline. Extended our venotomy from the area of previous cannulation site all the way near the arterial anastomosis. The  arterial anastomosis easily passed a 3 dilator. We then sewed a bovine pericardial patch in place with 6-0 Prolene suture. Prior to completing the anastomosis we allowed flushing techniques. We then completed opened our venous followed by arterial and and had brisk flow by palpation and Doppler. We then obtained hemostasis of our wound and closed in 2 layers with 3-0 Vicryl and 4-0 Monocryl. Patient was allowed awaken from anesthesia having tolerated the procedure well immediate complication and instrument counts were correct at completion.    Tykerria Mccubbins C. Donzetta Matters, MD [facility] [place] Office: [phone] Pager: [phone]

## 2016-08-09 NOTE — Anesthesia Procedure Notes (Addendum)
Procedure Name: LMA Insertion Date/Time: 08/09/2016 7:40 AM Performed by: Jacquiline Doe A Pre-anesthesia Checklist: Patient identified, Emergency Drugs available, Suction available and Patient being monitored Patient Re-evaluated:Patient Re-evaluated prior to inductionOxygen Delivery Method: Circle System Utilized and Circle system utilized Preoxygenation: Pre-oxygenation with 100% oxygen Intubation Type: IV induction Ventilation: Mask ventilation without difficulty LMA: LMA inserted LMA Size: 4.0 Tube type: Oral Number of attempts: 1 Airway Equipment and Method: Bite block Placement Confirmation: positive ETCO2 Tube secured with: Tape Dental Injury: Teeth and Oropharynx as per pre-operative assessment

## 2016-08-09 NOTE — Anesthesia Postprocedure Evaluation (Addendum)
Anesthesia Post Note  Patient: Dale [name]  Procedure(s) Performed: Procedure(s) (LRB): REVISION OF ARTERIAL ANASTOMOSIS OF ARTERIOVENOUS FISTULA (Left) LEFT  ARTERIOVENOUS FISTULA PATCH ANGIOPLASTY (Left)  Patient location during evaluation: PACU Anesthesia Type: General Level of consciousness: awake and alert, oriented and patient cooperative Pain management: pain level controlled Vital Signs Assessment: post-procedure vital signs reviewed and stable Respiratory status: spontaneous breathing, nonlabored ventilation and respiratory function stable Cardiovascular status: blood pressure returned to baseline and stable Postop Assessment: no signs of nausea or vomiting Anesthetic complications: no       Last Vitals:  Vitals:   08/09/16 1030 08/09/16 1045  BP: (!) 85/34 (!) 92/44  Pulse: 78 77  Resp:    Temp:      Last Pain:  Vitals:   08/09/16 0930  PainSc: Asleep                 Hridhaan Yohn,E. Clemens Lachman

## 2016-08-10 ENCOUNTER — Encounter (HOSPITAL_COMMUNITY): Payer: Self-pay | Admitting: Vascular Surgery

## 2016-08-22 DIAGNOSIS — E039 Hypothyroidism, unspecified: Secondary | ICD-10-CM | POA: Diagnosis not present

## 2016-08-22 DIAGNOSIS — T8612 Kidney transplant failure: Secondary | ICD-10-CM | POA: Diagnosis not present

## 2016-08-22 DIAGNOSIS — N186 End stage renal disease: Secondary | ICD-10-CM | POA: Diagnosis not present

## 2016-08-22 DIAGNOSIS — E782 Mixed hyperlipidemia: Secondary | ICD-10-CM | POA: Diagnosis not present

## 2016-08-22 DIAGNOSIS — Z992 Dependence on renal dialysis: Secondary | ICD-10-CM | POA: Diagnosis not present

## 2016-08-22 DIAGNOSIS — E291 Testicular hypofunction: Secondary | ICD-10-CM | POA: Diagnosis not present

## 2016-08-22 DIAGNOSIS — E103593 Type 1 diabetes mellitus with proliferative diabetic retinopathy without macular edema, bilateral: Secondary | ICD-10-CM | POA: Diagnosis not present

## 2016-08-23 DIAGNOSIS — Z794 Long term (current) use of insulin: Secondary | ICD-10-CM | POA: Diagnosis not present

## 2016-08-23 DIAGNOSIS — E113551 Type 2 diabetes mellitus with stable proliferative diabetic retinopathy, right eye: Secondary | ICD-10-CM | POA: Diagnosis not present

## 2016-08-23 DIAGNOSIS — E113532 Type 2 diabetes mellitus with proliferative diabetic retinopathy with traction retinal detachment not involving the macula, left eye: Secondary | ICD-10-CM | POA: Diagnosis not present

## 2016-08-24 DIAGNOSIS — T82818A Embolism of vascular prosthetic devices, implants and grafts, initial encounter: Secondary | ICD-10-CM | POA: Diagnosis not present

## 2016-08-24 DIAGNOSIS — N2581 Secondary hyperparathyroidism of renal origin: Secondary | ICD-10-CM | POA: Diagnosis not present

## 2016-08-24 DIAGNOSIS — D631 Anemia in chronic kidney disease: Secondary | ICD-10-CM | POA: Diagnosis not present

## 2016-08-24 DIAGNOSIS — Z111 Encounter for screening for respiratory tuberculosis: Secondary | ICD-10-CM | POA: Diagnosis not present

## 2016-08-24 DIAGNOSIS — D509 Iron deficiency anemia, unspecified: Secondary | ICD-10-CM | POA: Diagnosis not present

## 2016-08-24 DIAGNOSIS — N186 End stage renal disease: Secondary | ICD-10-CM | POA: Diagnosis not present

## 2016-08-26 DIAGNOSIS — N186 End stage renal disease: Secondary | ICD-10-CM | POA: Diagnosis not present

## 2016-08-26 DIAGNOSIS — Z111 Encounter for screening for respiratory tuberculosis: Secondary | ICD-10-CM | POA: Diagnosis not present

## 2016-08-26 DIAGNOSIS — D631 Anemia in chronic kidney disease: Secondary | ICD-10-CM | POA: Diagnosis not present

## 2016-08-26 DIAGNOSIS — T82818A Embolism of vascular prosthetic devices, implants and grafts, initial encounter: Secondary | ICD-10-CM | POA: Diagnosis not present

## 2016-08-26 DIAGNOSIS — D509 Iron deficiency anemia, unspecified: Secondary | ICD-10-CM | POA: Diagnosis not present

## 2016-08-26 DIAGNOSIS — N2581 Secondary hyperparathyroidism of renal origin: Secondary | ICD-10-CM | POA: Diagnosis not present

## 2016-08-29 DIAGNOSIS — D509 Iron deficiency anemia, unspecified: Secondary | ICD-10-CM | POA: Diagnosis not present

## 2016-08-29 DIAGNOSIS — T82818A Embolism of vascular prosthetic devices, implants and grafts, initial encounter: Secondary | ICD-10-CM | POA: Diagnosis not present

## 2016-08-29 DIAGNOSIS — N186 End stage renal disease: Secondary | ICD-10-CM | POA: Diagnosis not present

## 2016-08-29 DIAGNOSIS — D631 Anemia in chronic kidney disease: Secondary | ICD-10-CM | POA: Diagnosis not present

## 2016-08-29 DIAGNOSIS — N2581 Secondary hyperparathyroidism of renal origin: Secondary | ICD-10-CM | POA: Diagnosis not present

## 2016-08-29 DIAGNOSIS — Z111 Encounter for screening for respiratory tuberculosis: Secondary | ICD-10-CM | POA: Diagnosis not present

## 2016-08-31 DIAGNOSIS — N186 End stage renal disease: Secondary | ICD-10-CM | POA: Diagnosis not present

## 2016-08-31 DIAGNOSIS — Z111 Encounter for screening for respiratory tuberculosis: Secondary | ICD-10-CM | POA: Diagnosis not present

## 2016-08-31 DIAGNOSIS — D631 Anemia in chronic kidney disease: Secondary | ICD-10-CM | POA: Diagnosis not present

## 2016-08-31 DIAGNOSIS — D509 Iron deficiency anemia, unspecified: Secondary | ICD-10-CM | POA: Diagnosis not present

## 2016-08-31 DIAGNOSIS — N2581 Secondary hyperparathyroidism of renal origin: Secondary | ICD-10-CM | POA: Diagnosis not present

## 2016-08-31 DIAGNOSIS — T82818A Embolism of vascular prosthetic devices, implants and grafts, initial encounter: Secondary | ICD-10-CM | POA: Diagnosis not present

## 2016-09-01 DIAGNOSIS — R7989 Other specified abnormal findings of blood chemistry: Secondary | ICD-10-CM | POA: Diagnosis not present

## 2016-09-02 DIAGNOSIS — N2581 Secondary hyperparathyroidism of renal origin: Secondary | ICD-10-CM | POA: Diagnosis not present

## 2016-09-02 DIAGNOSIS — N186 End stage renal disease: Secondary | ICD-10-CM | POA: Diagnosis not present

## 2016-09-02 DIAGNOSIS — T82818A Embolism of vascular prosthetic devices, implants and grafts, initial encounter: Secondary | ICD-10-CM | POA: Diagnosis not present

## 2016-09-02 DIAGNOSIS — Z111 Encounter for screening for respiratory tuberculosis: Secondary | ICD-10-CM | POA: Diagnosis not present

## 2016-09-02 DIAGNOSIS — D631 Anemia in chronic kidney disease: Secondary | ICD-10-CM | POA: Diagnosis not present

## 2016-09-02 DIAGNOSIS — D509 Iron deficiency anemia, unspecified: Secondary | ICD-10-CM | POA: Diagnosis not present

## 2016-09-05 DIAGNOSIS — N186 End stage renal disease: Secondary | ICD-10-CM | POA: Diagnosis not present

## 2016-09-05 DIAGNOSIS — D631 Anemia in chronic kidney disease: Secondary | ICD-10-CM | POA: Diagnosis not present

## 2016-09-05 DIAGNOSIS — Z111 Encounter for screening for respiratory tuberculosis: Secondary | ICD-10-CM | POA: Diagnosis not present

## 2016-09-05 DIAGNOSIS — D509 Iron deficiency anemia, unspecified: Secondary | ICD-10-CM | POA: Diagnosis not present

## 2016-09-05 DIAGNOSIS — T82818A Embolism of vascular prosthetic devices, implants and grafts, initial encounter: Secondary | ICD-10-CM | POA: Diagnosis not present

## 2016-09-05 DIAGNOSIS — N2581 Secondary hyperparathyroidism of renal origin: Secondary | ICD-10-CM | POA: Diagnosis not present

## 2016-09-07 DIAGNOSIS — N2581 Secondary hyperparathyroidism of renal origin: Secondary | ICD-10-CM | POA: Diagnosis not present

## 2016-09-07 DIAGNOSIS — D631 Anemia in chronic kidney disease: Secondary | ICD-10-CM | POA: Diagnosis not present

## 2016-09-07 DIAGNOSIS — N186 End stage renal disease: Secondary | ICD-10-CM | POA: Diagnosis not present

## 2016-09-07 DIAGNOSIS — T82818A Embolism of vascular prosthetic devices, implants and grafts, initial encounter: Secondary | ICD-10-CM | POA: Diagnosis not present

## 2016-09-07 DIAGNOSIS — Z111 Encounter for screening for respiratory tuberculosis: Secondary | ICD-10-CM | POA: Diagnosis not present

## 2016-09-07 DIAGNOSIS — D509 Iron deficiency anemia, unspecified: Secondary | ICD-10-CM | POA: Diagnosis not present

## 2016-09-09 DIAGNOSIS — D509 Iron deficiency anemia, unspecified: Secondary | ICD-10-CM | POA: Diagnosis not present

## 2016-09-09 DIAGNOSIS — T82818A Embolism of vascular prosthetic devices, implants and grafts, initial encounter: Secondary | ICD-10-CM | POA: Diagnosis not present

## 2016-09-09 DIAGNOSIS — Z111 Encounter for screening for respiratory tuberculosis: Secondary | ICD-10-CM | POA: Diagnosis not present

## 2016-09-09 DIAGNOSIS — N186 End stage renal disease: Secondary | ICD-10-CM | POA: Diagnosis not present

## 2016-09-09 DIAGNOSIS — D631 Anemia in chronic kidney disease: Secondary | ICD-10-CM | POA: Diagnosis not present

## 2016-09-09 DIAGNOSIS — N2581 Secondary hyperparathyroidism of renal origin: Secondary | ICD-10-CM | POA: Diagnosis not present

## 2016-09-12 DIAGNOSIS — N2581 Secondary hyperparathyroidism of renal origin: Secondary | ICD-10-CM | POA: Diagnosis not present

## 2016-09-12 DIAGNOSIS — T82818A Embolism of vascular prosthetic devices, implants and grafts, initial encounter: Secondary | ICD-10-CM | POA: Diagnosis not present

## 2016-09-12 DIAGNOSIS — N186 End stage renal disease: Secondary | ICD-10-CM | POA: Diagnosis not present

## 2016-09-12 DIAGNOSIS — D509 Iron deficiency anemia, unspecified: Secondary | ICD-10-CM | POA: Diagnosis not present

## 2016-09-12 DIAGNOSIS — Z111 Encounter for screening for respiratory tuberculosis: Secondary | ICD-10-CM | POA: Diagnosis not present

## 2016-09-12 DIAGNOSIS — D631 Anemia in chronic kidney disease: Secondary | ICD-10-CM | POA: Diagnosis not present

## 2016-09-13 DIAGNOSIS — M533 Sacrococcygeal disorders, not elsewhere classified: Secondary | ICD-10-CM | POA: Diagnosis not present

## 2016-09-13 DIAGNOSIS — S3992XA Unspecified injury of lower back, initial encounter: Secondary | ICD-10-CM | POA: Diagnosis not present

## 2016-09-14 DIAGNOSIS — T82818A Embolism of vascular prosthetic devices, implants and grafts, initial encounter: Secondary | ICD-10-CM | POA: Diagnosis not present

## 2016-09-14 DIAGNOSIS — D509 Iron deficiency anemia, unspecified: Secondary | ICD-10-CM | POA: Diagnosis not present

## 2016-09-14 DIAGNOSIS — Z111 Encounter for screening for respiratory tuberculosis: Secondary | ICD-10-CM | POA: Diagnosis not present

## 2016-09-14 DIAGNOSIS — D631 Anemia in chronic kidney disease: Secondary | ICD-10-CM | POA: Diagnosis not present

## 2016-09-14 DIAGNOSIS — N186 End stage renal disease: Secondary | ICD-10-CM | POA: Diagnosis not present

## 2016-09-14 DIAGNOSIS — N2581 Secondary hyperparathyroidism of renal origin: Secondary | ICD-10-CM | POA: Diagnosis not present

## 2016-09-15 DIAGNOSIS — M25561 Pain in right knee: Secondary | ICD-10-CM | POA: Diagnosis not present

## 2016-09-15 DIAGNOSIS — G8929 Other chronic pain: Secondary | ICD-10-CM | POA: Diagnosis not present

## 2016-09-16 DIAGNOSIS — N186 End stage renal disease: Secondary | ICD-10-CM | POA: Diagnosis not present

## 2016-09-16 DIAGNOSIS — Z111 Encounter for screening for respiratory tuberculosis: Secondary | ICD-10-CM | POA: Diagnosis not present

## 2016-09-16 DIAGNOSIS — D631 Anemia in chronic kidney disease: Secondary | ICD-10-CM | POA: Diagnosis not present

## 2016-09-16 DIAGNOSIS — N2581 Secondary hyperparathyroidism of renal origin: Secondary | ICD-10-CM | POA: Diagnosis not present

## 2016-09-16 DIAGNOSIS — T82818A Embolism of vascular prosthetic devices, implants and grafts, initial encounter: Secondary | ICD-10-CM | POA: Diagnosis not present

## 2016-09-16 DIAGNOSIS — D509 Iron deficiency anemia, unspecified: Secondary | ICD-10-CM | POA: Diagnosis not present

## 2016-09-19 DIAGNOSIS — D509 Iron deficiency anemia, unspecified: Secondary | ICD-10-CM | POA: Diagnosis not present

## 2016-09-19 DIAGNOSIS — N186 End stage renal disease: Secondary | ICD-10-CM | POA: Diagnosis not present

## 2016-09-19 DIAGNOSIS — D631 Anemia in chronic kidney disease: Secondary | ICD-10-CM | POA: Diagnosis not present

## 2016-09-19 DIAGNOSIS — Z111 Encounter for screening for respiratory tuberculosis: Secondary | ICD-10-CM | POA: Diagnosis not present

## 2016-09-19 DIAGNOSIS — T82818A Embolism of vascular prosthetic devices, implants and grafts, initial encounter: Secondary | ICD-10-CM | POA: Diagnosis not present

## 2016-09-19 DIAGNOSIS — N2581 Secondary hyperparathyroidism of renal origin: Secondary | ICD-10-CM | POA: Diagnosis not present

## 2016-09-21 DIAGNOSIS — N186 End stage renal disease: Secondary | ICD-10-CM | POA: Diagnosis not present

## 2016-09-21 DIAGNOSIS — Z111 Encounter for screening for respiratory tuberculosis: Secondary | ICD-10-CM | POA: Diagnosis not present

## 2016-09-21 DIAGNOSIS — D631 Anemia in chronic kidney disease: Secondary | ICD-10-CM | POA: Diagnosis not present

## 2016-09-21 DIAGNOSIS — D509 Iron deficiency anemia, unspecified: Secondary | ICD-10-CM | POA: Diagnosis not present

## 2016-09-21 DIAGNOSIS — T82818A Embolism of vascular prosthetic devices, implants and grafts, initial encounter: Secondary | ICD-10-CM | POA: Diagnosis not present

## 2016-09-21 DIAGNOSIS — N2581 Secondary hyperparathyroidism of renal origin: Secondary | ICD-10-CM | POA: Diagnosis not present

## 2016-09-22 DIAGNOSIS — N186 End stage renal disease: Secondary | ICD-10-CM | POA: Diagnosis not present

## 2016-09-22 DIAGNOSIS — E1040 Type 1 diabetes mellitus with diabetic neuropathy, unspecified: Secondary | ICD-10-CM | POA: Diagnosis not present

## 2016-09-22 DIAGNOSIS — T8612 Kidney transplant failure: Secondary | ICD-10-CM | POA: Diagnosis not present

## 2016-09-22 DIAGNOSIS — R140 Abdominal distension (gaseous): Secondary | ICD-10-CM | POA: Diagnosis not present

## 2016-09-22 DIAGNOSIS — Z794 Long term (current) use of insulin: Secondary | ICD-10-CM | POA: Diagnosis not present

## 2016-09-22 DIAGNOSIS — R1013 Epigastric pain: Secondary | ICD-10-CM | POA: Diagnosis not present

## 2016-09-22 DIAGNOSIS — R109 Unspecified abdominal pain: Secondary | ICD-10-CM | POA: Diagnosis not present

## 2016-09-22 DIAGNOSIS — E108 Type 1 diabetes mellitus with unspecified complications: Secondary | ICD-10-CM | POA: Diagnosis not present

## 2016-09-22 DIAGNOSIS — Z992 Dependence on renal dialysis: Secondary | ICD-10-CM | POA: Diagnosis not present

## 2016-09-22 DIAGNOSIS — K5900 Constipation, unspecified: Secondary | ICD-10-CM | POA: Diagnosis not present

## 2016-09-22 DIAGNOSIS — E1059 Type 1 diabetes mellitus with other circulatory complications: Secondary | ICD-10-CM | POA: Diagnosis not present

## 2016-09-28 DIAGNOSIS — M533 Sacrococcygeal disorders, not elsewhere classified: Secondary | ICD-10-CM | POA: Diagnosis not present

## 2016-09-29 ENCOUNTER — Telehealth: Payer: Self-pay | Admitting: Cardiovascular Disease

## 2016-09-29 NOTE — Telephone Encounter (Signed)
New message     blood pressure is low, he has not taken bp medication in months and the medicine they give him a dialysis to keep it up is not helping either

## 2016-09-29 NOTE — Telephone Encounter (Signed)
Called and spoke with pt who is c/o low BP and has episodes of dizziness. Pt is a dialysis pt and gets Dialysis M W and F. Pt states he has these episodes of dizziness and feeling pressure between his shoulder blades and pressure in his jaw every day. He use to experience these episodes only after dialysis but now he is experiencing them every day. He checked his BP today when he was having an episode of dizziness and pressure buildup between his shoulder, his BP was 61/37. His last BP recordings are as follows: 6/7: 61/37, 5:00 pm 96/48 6/6: 67/46, 99/50 6/5: None recorded 6/4: 86/46  Advised patient that when he feels extremely dizzy and like he is going to faint that he should go to the ER to be evaluated. During the phone call, Pt's BP was 96/48. He stated he takes Midodrine 10 mg only on Dialysis days to help increase his BP. Pt advised to keep appt with Cecilie Kicks on 10/04/16 for f/u to discuss the BP issues. Will route to Dr. [name] for further review and recommendation. Pt verbalized understanding to go to ER when he feels so dizzy to the point he is going to faint and when his BP is extremely low. He thanked me for my call.

## 2016-09-29 NOTE — Telephone Encounter (Signed)
I agree that he needs to call EMS if he is dizzy with BP in the 60s. Keep appt as planned next week with [place] but seek ED evaluation if symptoms change.

## 2016-10-03 NOTE — Progress Notes (Signed)
Cardiology Office Note   Date:  10/04/2016   ID:  Amiere Cawley, DOB Dec 01, 1965, MRN [id number]  PCP:  [name], MD  Cardiologist:  Dr. [name]     Chief Complaint  Patient presents with  . Hypotension    + lightheaded.      History of Present Illness: Lorris Carducci is a 51 y.o. male who presents for symptomatic hypotension.    He has a history of CAD, DM, HTN, OSA, morbid obesity, ESRD on HD, fibromyalgia, chronic anemia, PAD status post left BKA.  He started dialysis and then had cardiac cath on 03/19/14 with severe calcified RCA stenosis that was treated with angioplasty but they could not expand the balloon. There appeared to be a small dissection so he was  brought   back for staged rotablator atherectomy of the mid RCA on 04/09/14. A 3.5 x 23 mm Xience DES was placed in the mid RCA. The distal RCA lesion was treated with balloon angioplasty alone. Possible allergic reaction to Plavix but did well with Effient.   NSTEMI. Repeat cath in September 2017 with severe disease in the diagonal branch treated with a drug eluting stent. He was discharged on ASA and Brilinta but due to nausea, he was changed to Effient. Echo October 2017 with normal LV systolic function. No valve disease.    Last seen by Dr. [name] in April for pre- op exam.  He had no angina.  + dyspnea but thought to be non cardiac.   Called last week with systolic BP in the 47W and instructed to go to ER if dizzy he also complained of paressure between shoulder blades and pressure in his jaws daily with the low BP.  Recent coccyx injection.  He takes the midodrine on dialysis days. But not on other days. He has not had BB in months. Due to hypotension.  The jaw and shoulder blade pain is only with BP in the 60s.    No palpitations or racing HR.  Past Medical History:  Diagnosis Date  . Anemia of chronic disease   . Arthritis    "hands, right knee" (03/19/2014) no cartlidge in right knee  . CAD  (coronary artery disease)    a. Abnl nuc ->LHC (11/15):  mid to dist Dx 80%, mid RCA 99% (functional CTO), dist RCA 80% >> PCI: balloon angioplasty to mid RCA (could not deliver stent) - staged PCI Rotoblator atherectomy/DES to [facility]. b. NSTEMI 12/2015 s/p DES to diagonal.  . Cholecystitis    a. 08/27/2011  . Chronic diastolic CHF (congestive heart failure) ([place])    a. Echo 3/13:  EF 55-60%;  b. Echo (11/15):  Mild LVH, EF 60-65%, no RWMA, Gr 1 DD, MAC, mild LAE, normal RVF, mild RAE;  c.  Echo 5/16:  severe LVH, EF 55-60%, no RWMA, Gr 1 DD, MAC, mild LAE  . Claustrophobia    when things get around his face.   . Complication of anesthesia   . Difficult intubation    only once in 2015 at [facility] haven't had a problem since then  . ESRD (end stage renal disease) ([place])    a. 1995 s/p cadaveric transplant w/ susbequent failure after 18 yrs;  b.Dialysis initiated 07/2011 [facility] M-W-F  . Fibromyalgia   . Gastroparesis   . GERD (gastroesophageal reflux disease)   . Gout    PMH  . History of blood transfusion   . HLD (hyperlipidemia)   . Hypertension   .  Hypothyroidism   . Inguinal lymphadenopathy    a. bilateral - s/p biopsy 07/2011  . Insulin dependent diabetes mellitus (HCC)    Type I  . Morbid obesity ([place])   . OSA (obstructive sleep apnea)    adjustable bed  . PAD (peripheral artery disease) (HCC)    a. s/p L BKA.  [name] Kitchen Pericardial effusion    a.  Small by CT 08/22/11;  b.  Large by CT 08/27/11. c. Not seen on 11/2015 echo.  . Pneumonia ~ 2007  . PONV (postoperative nausea and vomiting)    vomited once after a procedure.  . Tracheobronchomalacia     Past Surgical History:  Procedure Laterality Date  . A/V SHUNTOGRAM Left 07/26/2016   Procedure: A/V Fistulagram;  Surgeon: Waynetta Sandy, MD;  Location: [place] CV [facility];  Service: Cardiovascular;  Laterality: Left;  . AMPUTATION OF REPLICATED TOES Right   . ANGIOPLASTY  04/09/2014   RCA   . APPENDECTOMY  ~ 2006  .  ARTERIOVENOUS GRAFT PLACEMENT Left 1993?   forearm  . ARTERIOVENOUS GRAFT PLACEMENT Right 1993?   leg  . ARTERIOVENOUS GRAFT PLACEMENT Left    Thigh  . Arteriovenous Graft Removed Left    Thigh  . AV FISTULA PLACEMENT  07/29/2011   Procedure: ARTERIOVENOUS (AV) FISTULA CREATION;  Surgeon: Mal Misty, MD;  Location: [place];  Service: Vascular;  Laterality: Right;  Brachial cephalic  . AV FISTULA PLACEMENT Left 01/20/2015   Procedure: LIGATION OF RIGHT ARTERIOVENOUS FISTULA WITH EXCISION OF ANEURYSM;  Surgeon: Angelia Mould, MD;  Location: [place];  Service: Vascular;  Laterality: Left;  . AV FISTULA PLACEMENT Left 04/30/2015   Procedure: CREATION OF LEFT ARM BRACHIO-CEPHALIC ARTERIOVENOUS (AV) FISTULA ;  Surgeon: Angelia Mould, MD;  Location: [place];  Service: Vascular;  Laterality: Left;  . AV Graft PLACEMENT Left 1993?   "attempted one in my wrist; didn't take"  . BELOW KNEE LEG AMPUTATION Left 2010  . CARDIAC CATHETERIZATION  03/19/2014  . CARDIAC CATHETERIZATION  03/19/2014   Procedure: CORONARY BALLOON ANGIOPLASTY;  Surgeon: Burnell Blanks, MD;  Location: [facility] CATH LAB;  Service: Cardiovascular;;  . CARDIAC CATHETERIZATION N/A 01/01/2016   Procedure: Left Heart Cath and Coronary Angiography;  Surgeon: Troy Sine, MD;  Location: [place] CV [facility];  Service: Cardiovascular;  Laterality: N/A;  . CARDIAC CATHETERIZATION N/A 01/01/2016   Procedure: Coronary Stent Intervention;  Surgeon: Troy Sine, MD;  Location: [place] CV [facility];  Service: Cardiovascular;  Laterality: N/A;  . CARPAL TUNNEL RELEASE Bilateral after 2005  . CATARACT EXTRACTION W/ INTRAOCULAR LENS  IMPLANT, BILATERAL Bilateral 1990's  . COLONOSCOPY  08/29/2011   Procedure: COLONOSCOPY;  Surgeon: Jerene Bears, MD;  Location: [place];  Service: Gastroenterology;  Laterality: N/A;  . COLONOSCOPY WITH PROPOFOL N/A 04/26/2016   Procedure: COLONOSCOPY WITH PROPOFOL;  Surgeon: Jerene Bears, MD;  Location:  [facility] ENDOSCOPY;  Service: Gastroenterology;  Laterality: N/A;  . CORONARY ANGIOPLASTY WITH STENT PLACEMENT  04/09/2014       ptca/des mid lad   . ESOPHAGOGASTRODUODENOSCOPY N/A 12/25/2015   Procedure: ESOPHAGOGASTRODUODENOSCOPY (EGD);  Surgeon: Mauri Pole, MD;  Location: [facility] ENDOSCOPY;  Service: Endoscopy;  Laterality: N/A;  bedside  . ESOPHAGOGASTRODUODENOSCOPY (EGD) WITH PROPOFOL N/A 04/26/2016   Procedure: ESOPHAGOGASTRODUODENOSCOPY (EGD) WITH PROPOFOL;  Surgeon: Jerene Bears, MD;  Location: [facility] ENDOSCOPY;  Service: Gastroenterology;  Laterality: N/A;  . EYE SURGERY Bilateral    cataract  . FEMORAL ARTERY REPAIR  04/09/2014   ANGIOSEAL  . INSERTION OF DIALYSIS CATHETER  08/01/2011   Procedure: INSERTION OF DIALYSIS CATHETER;  Surgeon: Rosetta Posner, MD;  Location: [place];  Service: Vascular;  Laterality: Right;  insertion of dialysis catheter on right internal jugular vein  . INSERTION OF DIALYSIS CATHETER Right 01/20/2015   Procedure: INSERTION OF DIALYSIS CATHETER - RIGHT INTERNAL JUGULAR ;  Surgeon: Angelia Mould, MD;  Location: [place];  Service: Vascular;  Laterality: Right;  . KIDNEY TRANSPLANT  04/25/1993  . LEFT HEART CATHETERIZATION WITH CORONARY ANGIOGRAM N/A 03/19/2014   Procedure: LEFT HEART CATHETERIZATION WITH CORONARY ANGIOGRAM;  Surgeon: Burnell Blanks, MD;  Location: [facility] CATH LAB;  Service: Cardiovascular;  Laterality: N/A;  . LYMPH NODE BIOPSY  08/10/2011   Procedure: LYMPH NODE BIOPSY;  Surgeon: Merrie Roof, MD;  Location: [place];  Service: General;  Laterality: Left;  left groin lymph biopsy  . NEUROPLASTY / TRANSPOSITION ULNAR NERVE AT ELBOW Left after 2005  . PATCH ANGIOPLASTY Left 08/09/2016   Procedure: LEFT  ARTERIOVENOUS FISTULA PATCH ANGIOPLASTY;  Surgeon: Waynetta Sandy, MD;  Location: [place];  Service: Vascular;  Laterality: Left;  . PERCUTANEOUS CORONARY ROTOBLATOR INTERVENTION (PCI-R) N/A 04/09/2014   Procedure: PERCUTANEOUS CORONARY  ROTOBLATOR INTERVENTION (PCI-R);  Surgeon: Burnell Blanks, MD;  Location: [facility] CATH LAB;  Service: Cardiovascular;  Laterality: N/A;  . PERIPHERAL VASCULAR CATHETERIZATION N/A 12/25/2014   Procedure: Upper Extremity Venography;  Surgeon: Conrad Berryville, MD;  Location: [place] CV LAB;  Service: Cardiovascular;  Laterality: N/A;  . PERIPHERAL VASCULAR CATHETERIZATION Left 02/24/2016   Procedure: Fistulagram;  Surgeon: Waynetta Sandy, MD;  Location: [place] CV LAB;  Service: Cardiovascular;  Laterality: Left;  . PERIPHERAL VASCULAR CATHETERIZATION Left 02/24/2016   Procedure: Peripheral Vascular Intervention;  Surgeon: Waynetta Sandy, MD;  Location: [place] [facility];  Service: Cardiovascular;  Laterality: Left;  AV FISTULA  . PERITONEAL CATHETER INSERTION    . PERITONEAL CATHETER REMOVAL    . REVISON OF ARTERIOVENOUS FISTULA Right 06/06/2480   Procedure: PLICATION / REVISION OF ARTERIOVENOUS FISTULA;  Surgeon: Angelia Mould, MD;  Location: [place];  Service: Vascular;  Laterality: Right;  . REVISON OF ARTERIOVENOUS FISTULA Left 09/01/2015   Procedure: SUPERFICIALIZATION OF LEFT ARM BRACHIOCEPHALIC ARTERIOVENOUS FISTULA;  Surgeon: Angelia Mould, MD;  Location: [place];  Service: Vascular;  Laterality: Left;  . REVISON OF ARTERIOVENOUS FISTULA Left 08/09/2016   Procedure: REVISION OF ARTERIAL ANASTOMOSIS OF ARTERIOVENOUS FISTULA;  Surgeon: Waynetta Sandy, MD;  Location: [place];  Service: Vascular;  Laterality: Left;  . SHOULDER ARTHROSCOPY Left ~ 2006   frozen  . TOE AMPUTATION Bilateral after 2005  . UMBILICAL HERNIA REPAIR  1990's  . VENOGRAM Right 07/28/2011   Procedure: VENOGRAM;  Surgeon: Conrad Alma, MD;  Location: [facility] CATH LAB;  Service: Cardiovascular;  Laterality: Right;  . VITRECTOMY Bilateral      Current Outpatient Prescriptions  Medication Sig Dispense Refill  . acetaminophen (TYLENOL) 500 MG tablet Take 1,000 mg by mouth every 4 (four)  hours as needed for moderate pain or headache.     . albuterol (PROVENTIL HFA;VENTOLIN HFA) 108 (90 Base) MCG/ACT inhaler Inhale 2 puffs into the lungs every 6 (six) hours as needed for wheezing or shortness of breath. 1 Inhaler 6  . allopurinol (ZYLOPRIM) 100 MG tablet Take 200 mg by mouth daily.     [name] Kitchen aspirin EC 81 MG tablet Take 81 mg by mouth at  bedtime.    [name] Kitchen atorvastatin (LIPITOR) 80 MG tablet Take 1 tablet (80 mg total) by mouth daily at 6 PM. 30 tablet 0  . calcium acetate (PHOSLO) 667 MG capsule Take 139ms with meals and 6673m with snacks    . diclofenac (FLECTOR) 1.3 % PTCH Place 1 patch onto the skin 2 (two) times daily.    . [name] Kitchenocusate sodium (COLACE) 100 MG capsule Take 200 mg by mouth 2 (two) times daily.     . fenofibrate (TRICOR) 48 MG tablet Take 48 mg by mouth daily.     . fluticasone (FLONASE) 50 MCG/ACT nasal spray Place 2 sprays into both nostrils daily as needed for allergies.     . [name] KitchenOSRENOL 1000 MG PACK   6  . gabapentin (NEURONTIN) 300 MG capsule Take 300 mg by mouth 3 (three) times daily.    . [name] KitchenLUCAGON EMERGENCY 1 MG injection Inject 1 mg into the muscle daily as needed (low blood sugar).     . [name] KitchenYDROmorphone (DILAUDID) 2 MG tablet Take 2 mg by mouth 3 (three) times daily as needed for moderate pain or severe pain.    . hydrOXYzine (ATARAX/VISTARIL) 10 MG tablet Take 10 mg by mouth every 8 (eight) hours as needed.    . insulin glargine (LANTUS) 100 UNIT/ML injection Inject 36 Units into the skin at bedtime.    . insulin lispro (HUMALOG) 100 UNIT/ML injection Inject into the skin 3 (three) times daily. Blood glucose - 120, 1 unit for every 20 points over 120    . lanthanum (FOSRENOL) 1000 MG chewable tablet Chew 1,000 mg by mouth 3 (three) times daily with meals.    . [name] Kitchenevothyroxine (SYNTHROID, LEVOTHROID) 112 MCG tablet Take 112 mcg by mouth every evening. 2 hours after dinner    . loratadine (CLARITIN) 10 MG tablet Take 10 mg by mouth daily.    . metoCLOPramide (REGLAN) 10  MG tablet Take 10 mg by mouth 4 (four) times daily -  with meals and at bedtime.    . metoprolol tartrate (LOPRESSOR) 25 MG tablet Take 25 mg by mouth 2 (two) times daily as needed (take as directed for blood pressure).     . midodrine (PROAMATINE) 10 MG tablet Take 1 tablet (10 mg total) by mouth 3 (three) times daily. Takes 1 tab in mornings of dialysis and takes 2 tabs 30 mins before starting dialysis (m,w,f)    . multivitamin (RENA-VIT) TABS tablet Take 1 tablet by mouth daily.    . nitroGLYCERIN (NITROSTAT) 0.4 MG SL tablet Place 1 tablet (0.4 mg total) under the tongue every 5 (five) minutes as needed for chest pain. 25 tablet 4  . Nutritional Supplements (FEEDING SUPPLEMENT, NEPRO CARB STEADY,) LIQD Take 237 mLs by mouth daily. 30 Can 0  . omeprazole (PRILOSEC OTC) 20 MG tablet Take 20 mg by mouth daily.    . OXYGEN Inhale 3 L into the lungs at bedtime.    . Vladimir Fasterlycol-Propyl Glycol (SYSTANE) 0.4-0.3 % SOLN Place 1 drop into both eyes as needed (dry eyes).    . polyethylene glycol (MIRALAX / GLYCOLAX) packet Take 17 g by mouth daily. 14 each 0  . prasugrel (EFFIENT) 10 MG TABS tablet Take 10 mg by mouth daily.    . promethazine (PHENERGAN) 25 MG tablet Take 1 tablet (25 mg total) by mouth every 6 (six) hours as needed for nausea or vomiting. For nausea 30 tablet 0  . rOPINIRole (REQUIP) 2 MG tablet Take 2 mg by mouth every  evening.     . sodium chloride (MURO 128) 5 % ophthalmic ointment Place 1 application into the left eye at bedtime.    . sodium chloride (MURO 128) 5 % ophthalmic solution Place 1 drop into the left eye daily.    [name] Kitchen testosterone cypionate (DEPOTESTOSTERONE CYPIONATE) 200 MG/ML injection INJECT 1 ML INTO THE MUSCLE Q 14 DAYS  1  . Wound Cleansers (WOUND WASH) LIQD Apply 1 application topically daily as needed (wound care).     No current facility-administered medications for this visit.    Facility-Administered Medications Ordered in Other Visits  Medication Dose  Route Frequency Provider Last Rate Last Dose  . 0.9 %  sodium chloride infusion   Intravenous Continuous Angelia Mould, MD      . Chlorhexidine Gluconate Cloth 2 % PADS 6 each  6 each Topical Once Angelia Mould, MD        Allergies:   Prednisone; Adhesive [tape]; Amoxicillin; Clindamycin/lincomycin; Contrast media [iodinated diagnostic agents]; Enalapril; Mobic [meloxicam]; Morphine; Brilinta [ticagrelor]; Ciprofloxacin; Clopidogrel; Codeine; Doxycycline; Hydrocodone; Levofloxacin; Oxycodone; and Rocephin [ceftriaxone]    Social History:  The patient  reports that he has never smoked. He has never used smokeless tobacco. He reports that he drinks alcohol. He reports that he does not use drugs.   Family History:  [name] family history includes Bone cancer in his maternal aunt; Breast cancer in his maternal aunt; Colon polyps in his father; Diabetes in his father and mother; Hyperlipidemia in his mother; Hypertension in his father and mother; Liver cancer in his maternal aunt.    ROS:  General:no colds or fevers, + weight increase  Skin:no rashes or ulcers HEENT:no blurred vision, no congestion CV:see HPI PUL:see HPI GI:no diarrhea constipation or melena, no indigestion GU:no hematuria, no dysuria MS:no joint pain, no claudication, Lt BKA Neuro:no syncope, + lightheadedness Endo:+ diabetes he tells me it is controlled , + thyroid disease  Wt Readings from Last 3 Encounters:  10/04/16 289 lb (131.1 kg)  08/05/16 286 lb 6.4 oz (129.9 kg)  07/26/16 265 lb (120.2 kg)     PHYSICAL EXAM: VS:  BP 108/60   Pulse 80   Ht _0  (1.727 m)   Wt 289 lb (131.1 kg)   SpO2 97%   BMI 43.94 kg/m  , BMI Body mass index is 43.94 kg/m. General:Pleasant affect, NAD Skin:Warm and dry, brisk capillary refill HEENT:normocephalic, sclera clear, mucus membranes moist Neck:supple, no JVD, no bruits  Heart:S1S2 RRR with soft systolic murmur, no gallup, rub or click Lungs:clear  without rales, rhonchi, or wheezes FAO:ZHYQM,VHQI, non tender, + BS, do not palpate liver spleen or masses Ext:no lower ext edema, 2+ radial pulses Neuro:alert and oriented x 3, MAE, follows commands, + facial symmetry    EKG:  EKG is NOT ordered today.   Recent Labs: 01/04/2016: Magnesium 2.1 02/02/2016: B Natriuretic Peptide 1,185.6; TSH 4.840 02/26/2016: ALT 35 04/13/2016: Platelets 272 07/26/2016: BUN 50; Creatinine, Ser 7.10 08/09/2016: Hemoglobin 13.3; Potassium 4.6; Sodium 139    Lipid Panel    Component Value Date/Time   CHOL 143 02/25/2015 1400   TRIG 255 (H) 02/25/2015 1400   HDL 29 (L) 02/25/2015 1400   CHOLHDL 4.9 02/25/2015 1400   VLDL 51 (H) 02/25/2015 1400   LDLCALC 63 02/25/2015 1400       Other studies Reviewed: Additional studies/ records that were reviewed today include: .  Echo 02/03/16 Study Conclusions  - Procedure narrative: Transthoracic echocardiography. Image   quality was  suboptimal. The study was technically difficult.   Intravenous contrast (Definity) was administered. - Left ventricle: The cavity size was normal. Wall thickness was   increased in a pattern of mild LVH. Systolic function was normal.   The estimated ejection fraction was in the range of 55% to 60%.   Wall motion was normal; there were no regional wall motion   abnormalities. Features are consistent with a pseudonormal left   ventricular filling pattern, with concomitant abnormal relaxation   and increased filling pressure (grade 2 diastolic dysfunction). - Mitral valve: Calcified annulus. - Left atrium: The atrium was mildly dilated.   Cardiac cath 01/01/16 Procedures   Coronary Stent Intervention  Left Heart Cath and Coronary Angiography  Conclusion     Ost LAD lesion, 30 %stenosed.  Dist LAD lesion, 30 %stenosed.  Prox RCA to Mid RCA lesion, 0 %stenosed.  1st RPLB lesion, 50 %stenosed.  A STENT XIENCE ALPINE RX 2.77A12 drug eluting stent was successfully  placed.  1st Diag lesion, 95 %stenosed.  Post intervention, there is a 0% residual stenosis.  Dist RCA lesion, 30 %stenosed.   Two-vessel coronary obstructive disease with mild calcification of the proximal LAD with 30% narrowing, progressive 95% mid diagonal stenosis, 30% mid LAD narrowing; normal left circumflex coronary artery; and calcified large RCA with a widely patent stent in the mid RCA at the site of previous atherectomy, 30% acute margin narrowing, and 50% inferior branch PDA stenosis.  Successful PCI to the diagonal vessel with ultimate insertion of a 2.2515 mm Xience Alpine DES stent postdilated with a 2.4 mm with the 95% stenosis being reduced to 0%.  RECOMMENDATION: The patient will continue dual antiplatelet therapy with aspirin and Brilinta.  Medical therapy for concomitant CAD.     ASSESSMENT AND PLAN:  1.  Symptomatic hypotension - discussed with Dr. Curt Bears, will need to take midodrine 10 mg on non dialysis days.  Will begin BID.  Will send note to Dr. Moshe Cipro as well.  His jaw discomfort is due to hypotension.  Though if BP stable and this continues may need further work up.  No longer can tolerate BB. Follow up with Dr. [name] in 3 months. Unless further problems.   2. CAD with stent in 12/2015.  Pt on asa and effient.   3. ESRD on HD per nephrology  4. PAD with LBKA  5. DM   Current medicines are reviewed with the patient today.  The patient Has no concerns regarding medicines.  The following changes have been made:  See above Labs/ tests ordered today include:see above  Disposition:   FU:  see above  Signed, Cecilie Kicks, NP  10/04/2016 12:17 PM    [address] [place], [place], [place], [place] Phone: [phone]; Fax: [phone]

## 2016-10-04 ENCOUNTER — Ambulatory Visit (INDEPENDENT_AMBULATORY_CARE_PROVIDER_SITE_OTHER): Payer: Medicare Other | Admitting: Cardiology

## 2016-10-04 ENCOUNTER — Encounter: Payer: Self-pay | Admitting: Cardiology

## 2016-10-04 VITALS — BP 108/60 | HR 80 | Ht 68.0 in | Wt 289.0 lb

## 2016-10-04 DIAGNOSIS — N186 End stage renal disease: Secondary | ICD-10-CM | POA: Diagnosis not present

## 2016-10-04 DIAGNOSIS — I2510 Atherosclerotic heart disease of native coronary artery without angina pectoris: Secondary | ICD-10-CM

## 2016-10-04 DIAGNOSIS — I9589 Other hypotension: Secondary | ICD-10-CM | POA: Diagnosis not present

## 2016-10-04 DIAGNOSIS — I959 Hypotension, unspecified: Secondary | ICD-10-CM

## 2016-10-04 NOTE — Patient Instructions (Addendum)
Medication Instructions:   TAKE MIDODRINE 10 MG TWICE A DAY ON NON DIALYSIS DAYS   If you need a refill on your cardiac medications before your next appointment, please call your pharmacy.  Labwork: NONE ORDERED  TODAY    Testing/Procedures: NONE ORDERED  TODAY    Follow-Up: IN 2 TO 3 MONTHS WITH DR Clydene Fake    Any Other Special Instructions Will Be Listed Below (If Applicable).

## 2016-10-06 DIAGNOSIS — Z79899 Other long term (current) drug therapy: Secondary | ICD-10-CM | POA: Diagnosis not present

## 2016-10-06 DIAGNOSIS — M2550 Pain in unspecified joint: Secondary | ICD-10-CM | POA: Diagnosis not present

## 2016-10-14 ENCOUNTER — Inpatient Hospital Stay (HOSPITAL_COMMUNITY)
Admission: EM | Admit: 2016-10-14 | Discharge: 2016-10-16 | DRG: 314 | Disposition: A | Discharge status: Home / Self Care | Payer: Medicare Other | Attending: Internal Medicine | Admitting: Internal Medicine

## 2016-10-14 ENCOUNTER — Emergency Department (HOSPITAL_COMMUNITY): Payer: Medicare Other

## 2016-10-14 ENCOUNTER — Encounter (HOSPITAL_COMMUNITY): Payer: Self-pay

## 2016-10-14 DIAGNOSIS — Z7982 Long term (current) use of aspirin: Secondary | ICD-10-CM

## 2016-10-14 DIAGNOSIS — I132 Hypertensive heart and chronic kidney disease with heart failure and with stage 5 chronic kidney disease, or end stage renal disease: Secondary | ICD-10-CM | POA: Diagnosis not present

## 2016-10-14 DIAGNOSIS — E1122 Type 2 diabetes mellitus with diabetic chronic kidney disease: Secondary | ICD-10-CM | POA: Diagnosis not present

## 2016-10-14 DIAGNOSIS — I959 Hypotension, unspecified: Secondary | ICD-10-CM

## 2016-10-14 DIAGNOSIS — E1136 Type 2 diabetes mellitus with diabetic cataract: Secondary | ICD-10-CM | POA: Diagnosis present

## 2016-10-14 DIAGNOSIS — Z794 Long term (current) use of insulin: Secondary | ICD-10-CM | POA: Diagnosis not present

## 2016-10-14 DIAGNOSIS — D649 Anemia, unspecified: Secondary | ICD-10-CM | POA: Diagnosis present

## 2016-10-14 DIAGNOSIS — E1143 Type 2 diabetes mellitus with diabetic autonomic (poly)neuropathy: Secondary | ICD-10-CM | POA: Diagnosis not present

## 2016-10-14 DIAGNOSIS — Z888 Allergy status to other drugs, medicaments and biological substances status: Secondary | ICD-10-CM

## 2016-10-14 DIAGNOSIS — Z6841 Body Mass Index (BMI) 40.0 and over, adult: Secondary | ICD-10-CM | POA: Diagnosis not present

## 2016-10-14 DIAGNOSIS — I2510 Atherosclerotic heart disease of native coronary artery without angina pectoris: Secondary | ICD-10-CM | POA: Diagnosis present

## 2016-10-14 DIAGNOSIS — E1151 Type 2 diabetes mellitus with diabetic peripheral angiopathy without gangrene: Secondary | ICD-10-CM | POA: Diagnosis not present

## 2016-10-14 DIAGNOSIS — Z885 Allergy status to narcotic agent status: Secondary | ICD-10-CM

## 2016-10-14 DIAGNOSIS — N186 End stage renal disease: Secondary | ICD-10-CM | POA: Diagnosis present

## 2016-10-14 DIAGNOSIS — Z955 Presence of coronary angioplasty implant and graft: Secondary | ICD-10-CM | POA: Diagnosis not present

## 2016-10-14 DIAGNOSIS — K3184 Gastroparesis: Secondary | ICD-10-CM | POA: Diagnosis present

## 2016-10-14 DIAGNOSIS — E785 Hyperlipidemia, unspecified: Secondary | ICD-10-CM | POA: Diagnosis present

## 2016-10-14 DIAGNOSIS — IMO0001 Reserved for inherently not codable concepts without codable children: Secondary | ICD-10-CM

## 2016-10-14 DIAGNOSIS — E1121 Type 2 diabetes mellitus with diabetic nephropathy: Secondary | ICD-10-CM | POA: Diagnosis present

## 2016-10-14 DIAGNOSIS — I252 Old myocardial infarction: Secondary | ICD-10-CM

## 2016-10-14 DIAGNOSIS — R34 Anuria and oliguria: Secondary | ICD-10-CM | POA: Diagnosis present

## 2016-10-14 DIAGNOSIS — M479 Spondylosis, unspecified: Secondary | ICD-10-CM | POA: Diagnosis present

## 2016-10-14 DIAGNOSIS — Z89512 Acquired absence of left leg below knee: Secondary | ICD-10-CM

## 2016-10-14 DIAGNOSIS — E119 Type 2 diabetes mellitus without complications: Secondary | ICD-10-CM | POA: Diagnosis not present

## 2016-10-14 DIAGNOSIS — Z881 Allergy status to other antibiotic agents status: Secondary | ICD-10-CM

## 2016-10-14 DIAGNOSIS — Z9981 Dependence on supplemental oxygen: Secondary | ICD-10-CM | POA: Diagnosis not present

## 2016-10-14 DIAGNOSIS — G8929 Other chronic pain: Secondary | ICD-10-CM | POA: Diagnosis present

## 2016-10-14 DIAGNOSIS — Z992 Dependence on renal dialysis: Secondary | ICD-10-CM | POA: Diagnosis not present

## 2016-10-14 DIAGNOSIS — E039 Hypothyroidism, unspecified: Secondary | ICD-10-CM | POA: Diagnosis present

## 2016-10-14 DIAGNOSIS — M797 Fibromyalgia: Secondary | ICD-10-CM | POA: Diagnosis present

## 2016-10-14 DIAGNOSIS — I5032 Chronic diastolic (congestive) heart failure: Secondary | ICD-10-CM | POA: Diagnosis present

## 2016-10-14 DIAGNOSIS — Z91041 Radiographic dye allergy status: Secondary | ICD-10-CM

## 2016-10-14 DIAGNOSIS — K219 Gastro-esophageal reflux disease without esophagitis: Secondary | ICD-10-CM | POA: Diagnosis present

## 2016-10-14 DIAGNOSIS — I25110 Atherosclerotic heart disease of native coronary artery with unstable angina pectoris: Secondary | ICD-10-CM | POA: Diagnosis not present

## 2016-10-14 DIAGNOSIS — I950 Idiopathic hypotension: Secondary | ICD-10-CM | POA: Diagnosis not present

## 2016-10-14 DIAGNOSIS — Z79899 Other long term (current) drug therapy: Secondary | ICD-10-CM

## 2016-10-14 DIAGNOSIS — R51 Headache: Secondary | ICD-10-CM | POA: Diagnosis not present

## 2016-10-14 DIAGNOSIS — E1129 Type 2 diabetes mellitus with other diabetic kidney complication: Secondary | ICD-10-CM | POA: Diagnosis present

## 2016-10-14 DIAGNOSIS — Z91048 Other nonmedicinal substance allergy status: Secondary | ICD-10-CM

## 2016-10-14 DIAGNOSIS — G4733 Obstructive sleep apnea (adult) (pediatric): Secondary | ICD-10-CM | POA: Diagnosis not present

## 2016-10-14 DIAGNOSIS — Z8249 Family history of ischemic heart disease and other diseases of the circulatory system: Secondary | ICD-10-CM

## 2016-10-14 DIAGNOSIS — R05 Cough: Secondary | ICD-10-CM | POA: Diagnosis not present

## 2016-10-14 LAB — COMPREHENSIVE METABOLIC PANEL
ALT: 30 U/L (ref 17–63)
AST: 36 U/L (ref 15–41)
Albumin: 3.6 g/dL (ref 3.5–5.0)
Alkaline Phosphatase: 96 U/L (ref 38–126)
Anion gap: 13 (ref 5–15)
BUN: 24 mg/dL — ABNORMAL HIGH (ref 6–20)
CO2: 31 mmol/L (ref 22–32)
Calcium: 9.5 mg/dL (ref 8.9–10.3)
Chloride: 97 mmol/L — ABNORMAL LOW (ref 101–111)
Creatinine, Ser: 6.07 mg/dL — ABNORMAL HIGH (ref 0.61–1.24)
GFR calc Af Amer: 11 mL/min — ABNORMAL LOW (ref >60)
GFR calc non Af Amer: 10 mL/min — ABNORMAL LOW (ref >60)
Glucose, Bld: 140 mg/dL — ABNORMAL HIGH (ref 65–99)
Potassium: 3.9 mmol/L (ref 3.5–5.1)
Sodium: 141 mmol/L (ref 135–145)
Total Bilirubin: 0.4 mg/dL (ref 0.3–1.2)
Total Protein: 7.5 g/dL (ref 6.5–8.1)

## 2016-10-14 LAB — CBC WITH DIFFERENTIAL/PLATELET
Basophils Absolute: 0 10*3/uL (ref 0.0–0.1)
Basophils Relative: 0 %
Eosinophils Absolute: 0.2 10*3/uL (ref 0.0–0.7)
Eosinophils Relative: 2 %
HCT: 40.9 % (ref 39.0–52.0)
Hemoglobin: 12.7 g/dL — ABNORMAL LOW (ref 13.0–17.0)
Lymphocytes Relative: 26 %
Lymphs Abs: 1.9 10*3/uL (ref 0.7–4.0)
MCH: 33.9 pg (ref 26.0–34.0)
MCHC: 31.1 g/dL (ref 30.0–36.0)
MCV: 109.1 fL — ABNORMAL HIGH (ref 78.0–100.0)
Monocytes Absolute: 0.4 10*3/uL (ref 0.1–1.0)
Monocytes Relative: 6 %
Neutro Abs: 5.1 10*3/uL (ref 1.7–7.7)
Neutrophils Relative %: 66 %
Platelets: 281 10*3/uL (ref 150–400)
RBC: 3.75 MIL/uL — ABNORMAL LOW (ref 4.22–5.81)
RDW: 15.5 % (ref 11.5–15.5)
WBC: 7.6 10*3/uL (ref 4.0–10.5)

## 2016-10-14 LAB — I-STAT CG4 LACTIC ACID, ED: Lactic Acid, Venous: 2.43 mmol/L (ref 0.5–1.9)

## 2016-10-14 MED ORDER — METOCLOPRAMIDE HCL 5 MG/ML IJ SOLN: 10.0000 mg | Freq: Once | INTRAMUSCULAR | Status: DC

## 2016-10-14 MED ORDER — ACETAMINOPHEN 500 MG PO TABS
1000.0000 mg | ORAL_TABLET | Freq: Once | ORAL | Status: AC
  Administered 2016-10-15: 1000 mg via ORAL
  Filled 2016-10-14: qty 2

## 2016-10-14 MED ORDER — SODIUM CHLORIDE 0.9 % IV BOLUS (SEPSIS)
500.0000 mL | Freq: Once | INTRAVENOUS | Status: AC
  Administered 2016-10-14: 500 mL via INTRAVENOUS

## 2016-10-14 NOTE — ED Notes (Signed)
This RN attempted IV access X2 without success.

## 2016-10-14 NOTE — ED Provider Notes (Signed)
[facility] DEPT Provider Note   CSN: [id number] Arrival date & time: 10/14/16  2217     History   Chief Complaint Chief Complaint  Patient presents with  . Dizziness    HPI Dale [name] is a 51 y.o. male.  HPI  51 year old male presents with dizziness and hypotension. He has a long-standing history of hypertension and is on Midodrine for this. He takes it 3 times per day on dialysis days such as today and twice on off days. He took it today as instructed. His blood pressure was too low at dialysis in the low 90s to take fluid off and so his blood was filtered instead. Around 8:30 PM tonight he had a severe top of the scalp headache. Feels like a throbbing headache. He's had this headache on and off for a few days but is worse tonight. No vomiting. No weakness or numbness in his extremities. Tonight he was also dizzy after this headache and his blood pressure was in the 67 range. His oxygen was okay but his heart rate was 150 per his wife. He denies any chest pain or shortness of breath. No vomiting. He's had some loose stools but no diarrhea. He states he's had a chronic cough for about 3 weeks. Normal BP for patient is around 40-981 systolic  Past Medical History:  Diagnosis Date  . Anemia of chronic disease   . Arthritis    "hands, right knee" (03/19/2014) no cartlidge in right knee  . CAD (coronary artery disease)    a. Abnl nuc ->LHC (11/15):  mid to dist Dx 80%, mid RCA 99% (functional CTO), dist RCA 80% >> PCI: balloon angioplasty to mid RCA (could not deliver stent) - staged PCI Rotoblator atherectomy/DES to [facility]. b. NSTEMI 12/2015 s/p DES to diagonal.  . Cholecystitis    a. 08/27/2011  . Chronic diastolic CHF (congestive heart failure) ([place])    a. Echo 3/13:  EF 55-60%;  b. Echo (11/15):  Mild LVH, EF 60-65%, no RWMA, Gr 1 DD, MAC, mild LAE, normal RVF, mild RAE;  c.  Echo 5/16:  severe LVH, EF 55-60%, no RWMA, Gr 1 DD, MAC, mild LAE  . Claustrophobia    when things get  around his face.   . Complication of anesthesia   . Difficult intubation    only once in 2015 at [facility] haven't had a problem since then  . ESRD (end stage renal disease) ([place])    a. 1995 s/p cadaveric transplant w/ susbequent failure after 18 yrs;  b.Dialysis initiated 07/2011 [facility] M-W-F  . Fibromyalgia   . Gastroparesis   . GERD (gastroesophageal reflux disease)   . Gout    PMH  . History of blood transfusion   . HLD (hyperlipidemia)   . Hypertension   . Hypothyroidism   . Inguinal lymphadenopathy    a. bilateral - s/p biopsy 07/2011  . Insulin dependent diabetes mellitus (HCC)    Type I  . Morbid obesity ([place])   . OSA (obstructive sleep apnea)    adjustable bed  . PAD (peripheral artery disease) (HCC)    a. s/p L BKA.  [name] Kitchen Pericardial effusion    a.  Small by CT 08/22/11;  b.  Large by CT 08/27/11. c. Not seen on 11/2015 echo.  . Pneumonia ~ 2007  . PONV (postoperative nausea and vomiting)    vomited once after a procedure.  . Tracheobronchomalacia     Patient Active Problem List   Diagnosis  Date Noted  . Heme positive stool   . Hypotension 04/13/2016  . Rectal bleeding 03/24/2016  . IDDM (insulin dependent diabetes mellitus) ([place]) 03/24/2016  . Oxygen dependent 03/24/2016  . Antiplatelet or antithrombotic long-term use 03/24/2016  . Sleep related hypoxia 03/03/2016  . Chronic diastolic heart failure ([place]) 02/25/2016  . Tracheobronchomalacia 02/04/2016  . Anemia of chronic disease   . Diabetes mellitus with complication ([place])   . Iron deficiency anemia   . Gastrointestinal hemorrhage with melena 12/21/2015  . Pulmonary edema 03/09/2015  . Chronic pain 03/09/2015  . Diabetes mellitus ([place]) 03/09/2015  . Accelerated hypertension 02/24/2015  . DM (diabetes mellitus) type II controlled with renal manifestation ([place]) 02/24/2015  . HLD (hyperlipidemia) 11/27/2014  . ESRD on dialysis ([place]) 09/30/2014  . Coronary artery disease involving native coronary artery of  native heart with unstable angina pectoris ([place]) 03/20/2014  . Allergic reaction to dye 03/20/2014  . NSTEMI (non-ST elevated myocardial infarction) ([place]) 03/19/2014  . HTN (hypertension) 02/18/2014  . Colitis, ischemic ([place]) 08/31/2011  . Pericardial effusion 08/27/2011  . History of renal transplant 07/22/2011  . S/P BKA (below knee amputation) ([place]) 07/22/2011  . OSA (obstructive sleep apnea) 07/22/2011  . Anemia 07/22/2011  . Diabetes mellitus with nephropathy ([place]) 07/22/2011    Past Surgical History:  Procedure Laterality Date  . A/V SHUNTOGRAM Left 07/26/2016   Procedure: A/V Fistulagram;  Surgeon: Waynetta Sandy, MD;  Location: [facility];  Service: Cardiovascular;  Laterality: Left;  . AMPUTATION OF REPLICATED TOES Right   . ANGIOPLASTY  04/09/2014   RCA   . APPENDECTOMY  ~ 2006  . ARTERIOVENOUS GRAFT PLACEMENT Left 1993?   forearm  . ARTERIOVENOUS GRAFT PLACEMENT Right 1993?   leg  . ARTERIOVENOUS GRAFT PLACEMENT Left    Thigh  . Arteriovenous Graft Removed Left    Thigh  . AV FISTULA PLACEMENT  07/29/2011   Procedure: ARTERIOVENOUS (AV) FISTULA CREATION;  Surgeon: Mal Misty, MD;  Location: [facility];  Service: Vascular;  Laterality: Right;  Brachial cephalic  . AV FISTULA PLACEMENT Left 01/20/2015   Procedure: LIGATION OF RIGHT ARTERIOVENOUS FISTULA WITH EXCISION OF ANEURYSM;  Surgeon: Angelia Mould, MD;  Location: [facility];  Service: Vascular;  Laterality: Left;  . AV FISTULA PLACEMENT Left 04/30/2015   Procedure: CREATION OF LEFT ARM BRACHIO-CEPHALIC ARTERIOVENOUS (AV) FISTULA ;  Surgeon: Angelia Mould, MD;  Location: [facility];  Service: Vascular;  Laterality: Left;  . AV Graft PLACEMENT Left 1993?   "attempted one in my wrist; didn't take"  . BELOW KNEE LEG AMPUTATION Left 2010  . CARDIAC CATHETERIZATION  03/19/2014  . CARDIAC CATHETERIZATION  03/19/2014   Procedure: CORONARY BALLOON ANGIOPLASTY;  Surgeon: Burnell Blanks, MD;   Location: [facility] CATH LAB;  Service: Cardiovascular;;  . CARDIAC CATHETERIZATION N/A 01/01/2016   Procedure: Left Heart Cath and Coronary Angiography;  Surgeon: Troy Sine, MD;  Location: [facility] CV LAB;  Service: Cardiovascular;  Laterality: N/A;  . CARDIAC CATHETERIZATION N/A 01/01/2016   Procedure: Coronary Stent Intervention;  Surgeon: Troy Sine, MD;  Location: [facility] CV LAB;  Service: Cardiovascular;  Laterality: N/A;  . CARPAL TUNNEL RELEASE Bilateral after 2005  . CATARACT EXTRACTION W/ INTRAOCULAR LENS  IMPLANT, BILATERAL Bilateral 1990's  . COLONOSCOPY  08/29/2011   Procedure: COLONOSCOPY;  Surgeon: Jerene Bears, MD;  Location: [facility];  Service: Gastroenterology;  Laterality: N/A;  . COLONOSCOPY WITH PROPOFOL N/A 04/26/2016   Procedure: COLONOSCOPY WITH PROPOFOL;  Surgeon: Jerene Bears, MD;  Location: [facility];  Service: Gastroenterology;  Laterality: N/A;  . CORONARY ANGIOPLASTY WITH STENT PLACEMENT  04/09/2014       ptca/des mid lad   . ESOPHAGOGASTRODUODENOSCOPY N/A 12/25/2015   Procedure: ESOPHAGOGASTRODUODENOSCOPY (EGD);  Surgeon: Mauri Pole, MD;  Location: [facility] ENDOSCOPY;  Service: Endoscopy;  Laterality: N/A;  bedside  . ESOPHAGOGASTRODUODENOSCOPY (EGD) WITH PROPOFOL N/A 04/26/2016   Procedure: ESOPHAGOGASTRODUODENOSCOPY (EGD) WITH PROPOFOL;  Surgeon: Jerene Bears, MD;  Location: [facility] ENDOSCOPY;  Service: Gastroenterology;  Laterality: N/A;  . EYE SURGERY Bilateral    cataract  . FEMORAL ARTERY REPAIR  04/09/2014   ANGIOSEAL  . INSERTION OF DIALYSIS CATHETER  08/01/2011   Procedure: INSERTION OF DIALYSIS CATHETER;  Surgeon: Rosetta Posner, MD;  Location: [facility];  Service: Vascular;  Laterality: Right;  insertion of dialysis catheter on right internal jugular vein  . INSERTION OF DIALYSIS CATHETER Right 01/20/2015   Procedure: INSERTION OF DIALYSIS CATHETER - RIGHT INTERNAL JUGULAR ;  Surgeon: Angelia Mould, MD;  Location: [facility];  Service: Vascular;   Laterality: Right;  . KIDNEY TRANSPLANT  04/25/1993  . LEFT HEART CATHETERIZATION WITH CORONARY ANGIOGRAM N/A 03/19/2014   Procedure: LEFT HEART CATHETERIZATION WITH CORONARY ANGIOGRAM;  Surgeon: Burnell Blanks, MD;  Location: [facility] CATH LAB;  Service: Cardiovascular;  Laterality: N/A;  . LYMPH NODE BIOPSY  08/10/2011   Procedure: LYMPH NODE BIOPSY;  Surgeon: Merrie Roof, MD;  Location: [facility] [place];  Service: General;  Laterality: Left;  left groin lymph biopsy  . NEUROPLASTY / TRANSPOSITION ULNAR NERVE AT ELBOW Left after 2005  . PATCH ANGIOPLASTY Left 08/09/2016   Procedure: LEFT  ARTERIOVENOUS FISTULA PATCH ANGIOPLASTY;  Surgeon: Waynetta Sandy, MD;  Location: [facility];  Service: Vascular;  Laterality: Left;  . PERCUTANEOUS CORONARY ROTOBLATOR INTERVENTION (PCI-R) N/A 04/09/2014   Procedure: PERCUTANEOUS CORONARY ROTOBLATOR INTERVENTION (PCI-R);  Surgeon: Burnell Blanks, MD;  Location: [facility] CATH LAB;  Service: Cardiovascular;  Laterality: N/A;  . PERIPHERAL VASCULAR CATHETERIZATION N/A 12/25/2014   Procedure: Upper Extremity Venography;  Surgeon: Conrad [place], MD;  Location: [place] CV LAB;  Service: Cardiovascular;  Laterality: N/A;  . PERIPHERAL VASCULAR CATHETERIZATION Left 02/24/2016   Procedure: Fistulagram;  Surgeon: Waynetta Sandy, MD;  Location: [facility] CV LAB;  Service: Cardiovascular;  Laterality: Left;  . PERIPHERAL VASCULAR CATHETERIZATION Left 02/24/2016   Procedure: Peripheral Vascular Intervention;  Surgeon: Waynetta Sandy, MD;  Location: [facility] CV LAB;  Service: Cardiovascular;  Laterality: Left;  AV FISTULA  . PERITONEAL CATHETER INSERTION    . PERITONEAL CATHETER REMOVAL    . REVISON OF ARTERIOVENOUS FISTULA Right 9/47/6546   Procedure: PLICATION / REVISION OF ARTERIOVENOUS FISTULA;  Surgeon: Angelia Mould, MD;  Location: [facility];  Service: Vascular;  Laterality: Right;  . REVISON OF ARTERIOVENOUS FISTULA Left 09/01/2015    Procedure: SUPERFICIALIZATION OF LEFT ARM BRACHIOCEPHALIC ARTERIOVENOUS FISTULA;  Surgeon: Angelia Mould, MD;  Location: [facility];  Service: Vascular;  Laterality: Left;  . REVISON OF ARTERIOVENOUS FISTULA Left 08/09/2016   Procedure: REVISION OF ARTERIAL ANASTOMOSIS OF ARTERIOVENOUS FISTULA;  Surgeon: Waynetta Sandy, MD;  Location: [facility];  Service: Vascular;  Laterality: Left;  . SHOULDER ARTHROSCOPY Left ~ 2006   frozen  . TOE AMPUTATION Bilateral after 2005  . UMBILICAL HERNIA REPAIR  1990's  . VENOGRAM Right 07/28/2011   Procedure: VENOGRAM;  Surgeon: Conrad C[place], MD;  Location: [facility] CATH LAB;  Service:  Cardiovascular;  Laterality: Right;  . VITRECTOMY Bilateral        Home Medications    Prior to Admission medications   Medication Sig Start Date End Date Taking? Authorizing Provider  acetaminophen (TYLENOL) 500 MG tablet Take 1,000 mg by mouth every 4 (four) hours as needed for moderate pain or headache.  01/16/16   [provider]  albuterol (PROVENTIL HFA;VENTOLIN HFA) 108 (90 Base) MCG/ACT inhaler Inhale 2 puffs into the lungs every 6 (six) hours as needed for wheezing or shortness of breath. 01/26/16   Brand Males, MD  allopurinol (ZYLOPRIM) 100 MG tablet Take 200 mg by mouth daily.  03/14/12   [provider]  aspirin EC 81 MG tablet Take 81 mg by mouth at bedtime.    [provider]  atorvastatin (LIPITOR) 80 MG tablet Take 1 tablet (80 mg total) by mouth daily at 6 PM. 01/04/16   Florencia Reasons, MD  calcium acetate (PHOSLO) 667 MG capsule Take 1374ms with meals and 6619m with snacks 09/19/12   [provider]  diclofenac (FLECTOR) 1.3 % PTCH Place 1 patch onto the skin 2 (two) times daily.    [provider]  docusate sodium (COLACE) 100 MG capsule Take 200 mg by mouth 2 (two) times daily.     [provider]  fenofibrate (TRICOR) 48 MG tablet Take 48 mg by mouth daily.     [provider]  fluticasone  (FLONASE) 50 MCG/ACT nasal spray Place 2 sprays into both nostrils daily as needed for allergies.     [provider]  FOSRENOL 1000 MG PACK  09/14/16   [provider]  gabapentin (NEURONTIN) 300 MG capsule Take 300 mg by mouth 3 (three) times daily.    [provider]  GLUCAGON EMERGENCY 1 MG injection Inject 1 mg into the muscle daily as needed (low blood sugar).  01/19/12   [provider]  HYDROmorphone (DILAUDID) 2 MG tablet Take 2 mg by mouth 3 (three) times daily as needed for moderate pain or severe pain.    [provider]  hydrOXYzine (ATARAX/VISTARIL) 10 MG tablet Take 10 mg by mouth every 8 (eight) hours as needed.    [provider]  insulin glargine (LANTUS) 100 UNIT/ML injection Inject 36 Units into the skin at bedtime.    [provider]  insulin lispro (HUMALOG) 100 UNIT/ML injection Inject into the skin 3 (three) times daily. Blood glucose - 120, 1 unit for every 20 points over 120    [provider]  lanthanum (FOSRENOL) 1000 MG chewable tablet Chew 1,000 mg by mouth 3 (three) times daily with meals.    [provider]  levothyroxine (SYNTHROID, LEVOTHROID) 112 MCG tablet Take 112 mcg by mouth every evening. 2 hours after dinner    [provider]  loratadine (CLARITIN) 10 MG tablet Take 10 mg by mouth daily.    [provider]  metoCLOPramide (REGLAN) 10 MG tablet Take 10 mg by mouth 4 (four) times daily -  with meals and at bedtime.    [provider]  metoprolol tartrate (LOPRESSOR) 25 MG tablet Take 25 mg by mouth 2 (two) times daily as needed (take as directed for blood pressure).     [provider]  midodrine (PROAMATINE) 10 MG tablet Take 1 tablet (10 mg total) by mouth 3 (three) times daily. Takes 1 tab in mornings of dialysis and takes 2 tabs 30 mins before starting dialysis (m,w,f) 04/14/16   Elgergawy, DaEmeline Gins  S, MD  multivitamin (RENA-VIT) TABS tablet Take 1  tablet by mouth daily.    [provider]  nitroGLYCERIN (NITROSTAT) 0.4 MG SL tablet Place 1 tablet (0.4 mg total) under the tongue every 5 (five) minutes as needed for chest pain. 02/15/16   Burnell Blanks, MD  Nutritional Supplements (FEEDING SUPPLEMENT, NEPRO CARB STEADY,) LIQD Take 237 mLs by mouth daily. 01/04/16   Florencia Reasons, MD  omeprazole (PRILOSEC OTC) 20 MG tablet Take 20 mg by mouth daily.    [provider]  OXYGEN Inhale 3 L into the lungs at bedtime.    [provider]  Polyethyl Glycol-Propyl Glycol (SYSTANE) 0.4-0.3 % SOLN Place 1 drop into both eyes as needed (dry eyes).    [provider]  polyethylene glycol (MIRALAX / GLYCOLAX) packet Take 17 g by mouth daily. 02/06/16   Regalado, Belkys A, MD  prasugrel (EFFIENT) 10 MG TABS tablet Take 10 mg by mouth daily.    [provider]  promethazine (PHENERGAN) 25 MG tablet Take 1 tablet (25 mg total) by mouth every 6 (six) hours as needed for nausea or vomiting. For nausea 11/27/14   Richardson Dopp T, PA-C  rOPINIRole (REQUIP) 2 MG tablet Take 2 mg by mouth every evening.     [provider]  sodium chloride (MURO 128) 5 % ophthalmic ointment Place 1 application into the left eye at bedtime.    [provider]  sodium chloride (MURO 128) 5 % ophthalmic solution Place 1 drop into the left eye daily.    [provider]  testosterone cypionate (DEPOTESTOSTERONE CYPIONATE) 200 MG/ML injection INJECT 1 ML INTO THE MUSCLE Q 14 DAYS 09/10/16   [provider]  Wound Cleansers (WOUND Ruma) LIQD Apply 1 application topically daily as needed (wound care).    [provider]    Family History Family History  Problem Relation Age of Onset  . Colon polyps Father   . Diabetes Father   . Hypertension Father   . Hypertension Mother   . Diabetes Mother   . Hyperlipidemia Mother   . Breast cancer Maternal Aunt   . Bone cancer Maternal Aunt   . Liver  cancer Maternal Aunt   . Malignant hyperthermia Neg Hx   . Heart attack Neg Hx   . Stroke Neg Hx   . Colon cancer Neg Hx   . Stomach cancer Neg Hx   . Pancreatic cancer Neg Hx   . Esophageal cancer Neg Hx     Social History Social History  Substance Use Topics  . Smoking status: Never Smoker  . Smokeless tobacco: Never Used  . Alcohol use Yes     Comment: occasional     Allergies   Prednisone; Adhesive [tape]; Amoxicillin; Clindamycin/lincomycin; Contrast media [iodinated diagnostic agents]; Enalapril; Mobic [meloxicam]; Morphine; Brilinta [ticagrelor]; Ciprofloxacin; Clopidogrel; Codeine; Doxycycline; Hydrocodone; Levofloxacin; Oxycodone; and Rocephin [ceftriaxone]   Review of Systems Review of Systems  Constitutional: Negative for fever.  Respiratory: Positive for cough. Negative for shortness of breath.   Cardiovascular: Negative for chest pain.  Gastrointestinal: Negative for abdominal pain, diarrhea, nausea and vomiting.  Neurological: Positive for dizziness, light-headedness and headaches. Negative for weakness and numbness.  All other systems reviewed and are negative.    Physical Exam Updated Vital Signs BP (!) 70/25   Pulse 95   Temp 98.4 F (36.9 C)   Resp (!) 22   SpO2 95%   Physical Exam  Constitutional: He is oriented to person, place,  and time. He appears well-developed and well-nourished.  Morbidly obese  HENT:  Head: Normocephalic and atraumatic.  Right Ear: External ear normal.  Left Ear: External ear normal.  Nose: Nose normal.  Eyes: EOM are normal. Pupils are equal, round, and reactive to light. Right eye exhibits no discharge. Left eye exhibits no discharge.  Neck: Neck supple.  Cardiovascular: Normal rate, regular rhythm and normal heart sounds.   Pulmonary/Chest: Effort normal and breath sounds normal.  Abdominal: Soft. There is no tenderness.  Musculoskeletal: He exhibits no edema.  Neurological: He is alert and oriented to person,  place, and time.  CN 3-12 grossly intact. 5/5 strength in all 4 extremities. Grossly normal sensation.  Skin: Skin is warm and dry. He is not diaphoretic.  Nursing note and vitals reviewed.    ED Treatments / Results  Labs (all labs ordered are listed, but only abnormal results are displayed) Labs Reviewed  URINALYSIS, ROUTINE W REFLEX MICROSCOPIC  COMPREHENSIVE METABOLIC PANEL  CBC WITH DIFFERENTIAL/PLATELET  I-STAT CG4 LACTIC ACID, ED    EKG  EKG Interpretation  Date/Time:  Friday October 14 2016 22:24:24 EDT Ventricular Rate:  94 PR Interval:  124 QRS Duration: 88 QT Interval:  358 QTC Calculation: 447 R Axis:   75 Text Interpretation:  Normal sinus rhythm no acute ST/T changes no significant change since Dec 2017 Confirmed by Sherwood Gambler [id number]) on 10/14/2016 11:00:46 PM       Radiology No results found.  Procedures Procedures (including critical care time)  Medications Ordered in ED Medications  sodium chloride 0.9 % bolus 500 mL (not administered)  metoCLOPramide (REGLAN) injection 10 mg (not administered)  acetaminophen (TYLENOL) tablet 1,000 mg (not administered)     Initial Impression / Assessment and Plan / ED Course  I have reviewed the triage vital signs and the nursing notes.  Pertinent labs & imaging results that were available during my care of the patient were reviewed by me and considered in my medical decision making (see chart for details).     Patient's hypotension has improved with fluids. Unclear why he had headache, is worse tonight but has been ongoing for a few days. Afebrile, WBC normal. HA improving with fluids. Probably from hypotension. No signs of acute illness. This is a recurrent issue, do not think this is sepsis. D/w Dr Myna Hidalgo who requests ICU admission. Dr Detterding has viewed vitals, and feels this is stable for stepdown. BP stable currently. Admit to hospitalist.  Final Clinical Impressions(s) / ED Diagnoses   Final  diagnoses:  Hypotension, unspecified hypotension type    New Prescriptions New Prescriptions   No medications on file     Sherwood Gambler, MD 10/15/16 1012

## 2016-10-14 NOTE — ED Triage Notes (Signed)
Pt complaining of tachycardia and low blood pressure. Pt complaining of lightheaded and dizziness. Pt states some mid upper back pain. Pt HR = 94 at triage.

## 2016-10-14 NOTE — ED Notes (Signed)
Pt MWF dialysis pt. Pt went to dialysis but did not have anything pulled off due to hypotension at dialysis.

## 2016-10-14 NOTE — ED Notes (Signed)
ED Provider at bedside. 

## 2016-10-14 NOTE — ED Notes (Addendum)
EDP aware of BP. 

## 2016-10-14 NOTE — ED Notes (Signed)
Portable XRAY at bedside.

## 2016-10-15 ENCOUNTER — Encounter (HOSPITAL_COMMUNITY): Payer: Self-pay | Admitting: Family Medicine

## 2016-10-15 ENCOUNTER — Emergency Department (HOSPITAL_COMMUNITY): Payer: Medicare Other

## 2016-10-15 DIAGNOSIS — R34 Anuria and oliguria: Secondary | ICD-10-CM | POA: Diagnosis present

## 2016-10-15 DIAGNOSIS — I132 Hypertensive heart and chronic kidney disease with heart failure and with stage 5 chronic kidney disease, or end stage renal disease: Secondary | ICD-10-CM | POA: Diagnosis present

## 2016-10-15 DIAGNOSIS — R51 Headache: Secondary | ICD-10-CM | POA: Diagnosis not present

## 2016-10-15 DIAGNOSIS — E1121 Type 2 diabetes mellitus with diabetic nephropathy: Secondary | ICD-10-CM

## 2016-10-15 DIAGNOSIS — E039 Hypothyroidism, unspecified: Secondary | ICD-10-CM | POA: Diagnosis present

## 2016-10-15 DIAGNOSIS — I5032 Chronic diastolic (congestive) heart failure: Secondary | ICD-10-CM | POA: Diagnosis not present

## 2016-10-15 DIAGNOSIS — Z992 Dependence on renal dialysis: Secondary | ICD-10-CM

## 2016-10-15 DIAGNOSIS — N186 End stage renal disease: Secondary | ICD-10-CM

## 2016-10-15 DIAGNOSIS — E1143 Type 2 diabetes mellitus with diabetic autonomic (poly)neuropathy: Secondary | ICD-10-CM | POA: Diagnosis present

## 2016-10-15 DIAGNOSIS — Z794 Long term (current) use of insulin: Secondary | ICD-10-CM | POA: Diagnosis not present

## 2016-10-15 DIAGNOSIS — I959 Hypotension, unspecified: Secondary | ICD-10-CM | POA: Diagnosis present

## 2016-10-15 DIAGNOSIS — I25110 Atherosclerotic heart disease of native coronary artery with unstable angina pectoris: Secondary | ICD-10-CM | POA: Diagnosis not present

## 2016-10-15 DIAGNOSIS — Z955 Presence of coronary angioplasty implant and graft: Secondary | ICD-10-CM | POA: Diagnosis not present

## 2016-10-15 DIAGNOSIS — Z9981 Dependence on supplemental oxygen: Secondary | ICD-10-CM | POA: Diagnosis not present

## 2016-10-15 DIAGNOSIS — Z6841 Body Mass Index (BMI) 40.0 and over, adult: Secondary | ICD-10-CM | POA: Diagnosis not present

## 2016-10-15 DIAGNOSIS — E1136 Type 2 diabetes mellitus with diabetic cataract: Secondary | ICD-10-CM | POA: Diagnosis present

## 2016-10-15 DIAGNOSIS — E1151 Type 2 diabetes mellitus with diabetic peripheral angiopathy without gangrene: Secondary | ICD-10-CM | POA: Diagnosis present

## 2016-10-15 DIAGNOSIS — Z89512 Acquired absence of left leg below knee: Secondary | ICD-10-CM | POA: Diagnosis not present

## 2016-10-15 DIAGNOSIS — I2510 Atherosclerotic heart disease of native coronary artery without angina pectoris: Secondary | ICD-10-CM | POA: Diagnosis present

## 2016-10-15 DIAGNOSIS — E1122 Type 2 diabetes mellitus with diabetic chronic kidney disease: Secondary | ICD-10-CM | POA: Diagnosis present

## 2016-10-15 DIAGNOSIS — E119 Type 2 diabetes mellitus without complications: Secondary | ICD-10-CM

## 2016-10-15 DIAGNOSIS — E785 Hyperlipidemia, unspecified: Secondary | ICD-10-CM | POA: Diagnosis present

## 2016-10-15 DIAGNOSIS — I950 Idiopathic hypotension: Secondary | ICD-10-CM

## 2016-10-15 DIAGNOSIS — K219 Gastro-esophageal reflux disease without esophagitis: Secondary | ICD-10-CM | POA: Diagnosis present

## 2016-10-15 DIAGNOSIS — Z7982 Long term (current) use of aspirin: Secondary | ICD-10-CM | POA: Diagnosis not present

## 2016-10-15 DIAGNOSIS — G4733 Obstructive sleep apnea (adult) (pediatric): Secondary | ICD-10-CM | POA: Diagnosis present

## 2016-10-15 DIAGNOSIS — M479 Spondylosis, unspecified: Secondary | ICD-10-CM | POA: Diagnosis present

## 2016-10-15 DIAGNOSIS — G8929 Other chronic pain: Secondary | ICD-10-CM | POA: Diagnosis present

## 2016-10-15 LAB — BASIC METABOLIC PANEL
Anion gap: 11 (ref 5–15)
BUN: 27 mg/dL — ABNORMAL HIGH (ref 6–20)
CO2: 28 mmol/L (ref 22–32)
Calcium: 9.2 mg/dL (ref 8.9–10.3)
Chloride: 98 mmol/L — ABNORMAL LOW (ref 101–111)
Creatinine, Ser: 6.76 mg/dL — ABNORMAL HIGH (ref 0.61–1.24)
GFR calc Af Amer: 10 mL/min — ABNORMAL LOW (ref >60)
GFR calc non Af Amer: 9 mL/min — ABNORMAL LOW (ref >60)
Glucose, Bld: 245 mg/dL — ABNORMAL HIGH (ref 65–99)
Potassium: 4 mmol/L (ref 3.5–5.1)
Sodium: 137 mmol/L (ref 135–145)

## 2016-10-15 LAB — TROPONIN I
Troponin I: <0.03 ng/mL (ref <0.03)
Troponin I: <0.03 ng/mL (ref <0.03)
Troponin I: <0.03 ng/mL (ref <0.03)

## 2016-10-15 LAB — CBC WITH DIFFERENTIAL/PLATELET
Basophils Absolute: 0 10*3/uL (ref 0.0–0.1)
Basophils Relative: 0 %
Eosinophils Absolute: 0.2 10*3/uL (ref 0.0–0.7)
Eosinophils Relative: 3 %
HCT: 37.9 % — ABNORMAL LOW (ref 39.0–52.0)
Hemoglobin: 11.5 g/dL — ABNORMAL LOW (ref 13.0–17.0)
Lymphocytes Relative: 29 %
Lymphs Abs: 1.7 10*3/uL (ref 0.7–4.0)
MCH: 33 pg (ref 26.0–34.0)
MCHC: 30.3 g/dL (ref 30.0–36.0)
MCV: 108.6 fL — ABNORMAL HIGH (ref 78.0–100.0)
Monocytes Absolute: 0.2 10*3/uL (ref 0.1–1.0)
Monocytes Relative: 4 %
Neutro Abs: 3.9 10*3/uL (ref 1.7–7.7)
Neutrophils Relative %: 64 %
Platelets: 241 10*3/uL (ref 150–400)
RBC: 3.49 MIL/uL — ABNORMAL LOW (ref 4.22–5.81)
RDW: 15.5 % (ref 11.5–15.5)
WBC: 6 10*3/uL (ref 4.0–10.5)

## 2016-10-15 LAB — GLUCOSE, CAPILLARY
Glucose-Capillary: 268 mg/dL — ABNORMAL HIGH (ref 65–99)
Glucose-Capillary: 381 mg/dL — ABNORMAL HIGH (ref 65–99)
Glucose-Capillary: 351 mg/dL — ABNORMAL HIGH (ref 65–99)
Glucose-Capillary: 369 mg/dL — ABNORMAL HIGH (ref 65–99)

## 2016-10-15 LAB — MRSA PCR SCREENING: MRSA by PCR: POSITIVE — AB

## 2016-10-15 LAB — VITAMIN B12: Vitamin B-12: 499 pg/mL (ref 180–914)

## 2016-10-15 LAB — LACTIC ACID, PLASMA
Lactic Acid, Venous: 1.7 mmol/L (ref 0.5–1.9)
Lactic Acid, Venous: 1.6 mmol/L (ref 0.5–1.9)

## 2016-10-15 LAB — HIV ANTIBODY (ROUTINE TESTING W REFLEX): HIV Screen 4th Generation wRfx: NONREACTIVE

## 2016-10-15 LAB — TSH: TSH: 2.97 u[IU]/mL (ref 0.350–4.500)

## 2016-10-15 LAB — CORTISOL: Cortisol, Plasma: 4.1 ug/dL

## 2016-10-15 MED ORDER — INSULIN GLARGINE 100 UNIT/ML ~~LOC~~ SOLN
36.0000 [IU] | Freq: Every day | SUBCUTANEOUS | Status: DC
  Administered 2016-10-15: 36 [IU] via SUBCUTANEOUS
  Filled 2016-10-15: qty 0.36

## 2016-10-15 MED ORDER — SODIUM CHLORIDE 0.9 % IV BOLUS (SEPSIS): 500.0000 mL | INTRAVENOUS | Status: DC | PRN

## 2016-10-15 MED ORDER — SODIUM CHLORIDE (HYPERTONIC) 5 % OP OINT
1.0000 "application " | TOPICAL_OINTMENT | Freq: Every day | OPHTHALMIC | Status: DC
  Filled 2016-10-15: qty 3.5

## 2016-10-15 MED ORDER — ROPINIROLE HCL 1 MG PO TABS
2.0000 mg | ORAL_TABLET | Freq: Every evening | ORAL | Status: DC
  Administered 2016-10-15: 2 mg via ORAL
  Filled 2016-10-15: qty 2

## 2016-10-15 MED ORDER — ACETAMINOPHEN 500 MG PO TABS
1000.0000 mg | ORAL_TABLET | Freq: Four times a day (QID) | ORAL | Status: DC | PRN
  Administered 2016-10-15: 1000 mg via ORAL
  Filled 2016-10-15: qty 2

## 2016-10-15 MED ORDER — HYDROMORPHONE HCL 2 MG PO TABS
2.0000 mg | ORAL_TABLET | Freq: Three times a day (TID) | ORAL | Status: DC | PRN
Start: 2016-10-15 — End: 2016-10-16
  Administered 2016-10-15 – 2016-10-16 (×4): 2 mg via ORAL
  Filled 2016-10-15 (×5): qty 1

## 2016-10-15 MED ORDER — POLYVINYL ALCOHOL 1.4 % OP SOLN: 1.0000 [drp] | OPHTHALMIC | Status: DC | PRN

## 2016-10-15 MED ORDER — DOCUSATE SODIUM 100 MG PO CAPS
200.0000 mg | ORAL_CAPSULE | Freq: Two times a day (BID) | ORAL | Status: DC
  Administered 2016-10-15 – 2016-10-16 (×3): 200 mg via ORAL
  Filled 2016-10-15 (×3): qty 2

## 2016-10-15 MED ORDER — MUPIROCIN 2 % EX OINT
1.0000 "application " | TOPICAL_OINTMENT | Freq: Two times a day (BID) | CUTANEOUS | Status: DC
  Administered 2016-10-15 – 2016-10-16 (×2): 1 via NASAL
  Filled 2016-10-15: qty 22

## 2016-10-15 MED ORDER — LORATADINE 10 MG PO TABS
10.0000 mg | ORAL_TABLET | Freq: Every day | ORAL | Status: DC
  Administered 2016-10-15 – 2016-10-16 (×2): 10 mg via ORAL
  Filled 2016-10-15 (×2): qty 1

## 2016-10-15 MED ORDER — INSULIN ASPART 100 UNIT/ML ~~LOC~~ SOLN
0.0000 [IU] | Freq: Every day | SUBCUTANEOUS | Status: DC
  Administered 2016-10-15: 5 [IU] via SUBCUTANEOUS

## 2016-10-15 MED ORDER — ASPIRIN EC 81 MG PO TBEC: 81.0000 mg | DELAYED_RELEASE_TABLET | Freq: Every day | ORAL | Status: DC

## 2016-10-15 MED ORDER — POLYETHYLENE GLYCOL 3350 17 G PO PACK: 17.0000 g | PACK | Freq: Every day | ORAL | Status: DC | PRN

## 2016-10-15 MED ORDER — ALLOPURINOL 100 MG PO TABS
200.0000 mg | ORAL_TABLET | Freq: Every day | ORAL | Status: DC
  Administered 2016-10-15 – 2016-10-16 (×2): 200 mg via ORAL
  Filled 2016-10-15 (×3): qty 2

## 2016-10-15 MED ORDER — RENA-VITE PO TABS
1.0000 | ORAL_TABLET | Freq: Every day | ORAL | Status: DC
  Administered 2016-10-15: 1 via ORAL
  Filled 2016-10-15: qty 1

## 2016-10-15 MED ORDER — MIDODRINE HCL 5 MG PO TABS: 10.0000 mg | ORAL_TABLET | ORAL | Status: DC

## 2016-10-15 MED ORDER — PRASUGREL HCL 10 MG PO TABS
10.0000 mg | ORAL_TABLET | Freq: Every day | ORAL | Status: DC
  Administered 2016-10-15 – 2016-10-16 (×2): 10 mg via ORAL
  Filled 2016-10-15 (×2): qty 1

## 2016-10-15 MED ORDER — LEVOTHYROXINE SODIUM 112 MCG PO TABS
112.0000 ug | ORAL_TABLET | Freq: Every evening | ORAL | Status: DC
  Administered 2016-10-15: 112 ug via ORAL
  Filled 2016-10-15: qty 1

## 2016-10-15 MED ORDER — SODIUM CHLORIDE 0.9% FLUSH
3.0000 mL | Freq: Two times a day (BID) | INTRAVENOUS | Status: DC
Start: 2016-10-15 — End: 2016-10-16
  Administered 2016-10-15 – 2016-10-16 (×3): 3 mL via INTRAVENOUS

## 2016-10-15 MED ORDER — SODIUM CHLORIDE (HYPERTONIC) 5 % OP SOLN
1.0000 [drp] | Freq: Every day | OPHTHALMIC | Status: DC
  Filled 2016-10-15: qty 15

## 2016-10-15 MED ORDER — HEPARIN SODIUM (PORCINE) 5000 UNIT/ML IJ SOLN
5000.0000 [IU] | Freq: Three times a day (TID) | INTRAMUSCULAR | Status: DC
  Administered 2016-10-15 – 2016-10-16 (×3): 5000 [IU] via SUBCUTANEOUS
  Filled 2016-10-15 (×3): qty 1

## 2016-10-15 MED ORDER — ONDANSETRON HCL 4 MG PO TABS: 4.0000 mg | ORAL_TABLET | Freq: Four times a day (QID) | ORAL | Status: DC | PRN

## 2016-10-15 MED ORDER — CALCIUM ACETATE (PHOS BINDER) 667 MG PO CAPS
667.0000 mg | ORAL_CAPSULE | ORAL | Status: DC | PRN
  Filled 2016-10-15: qty 1

## 2016-10-15 MED ORDER — NEPRO/CARBSTEADY PO LIQD
237.0000 mL | ORAL | Status: DC
  Administered 2016-10-15: 237 mL via ORAL
  Filled 2016-10-15: qty 237

## 2016-10-15 MED ORDER — MIDODRINE HCL 5 MG PO TABS: 10.0000 mg | ORAL_TABLET | Freq: Three times a day (TID) | ORAL | Status: DC

## 2016-10-15 MED ORDER — FENOFIBRATE 54 MG PO TABS
54.0000 mg | ORAL_TABLET | Freq: Every day | ORAL | Status: DC
  Administered 2016-10-15 – 2016-10-16 (×2): 54 mg via ORAL
  Filled 2016-10-15 (×2): qty 1

## 2016-10-15 MED ORDER — CHLORHEXIDINE GLUCONATE CLOTH 2 % EX PADS
6.0000 | MEDICATED_PAD | Freq: Every day | CUTANEOUS | Status: DC
  Administered 2016-10-16: 6 via TOPICAL

## 2016-10-15 MED ORDER — MIDODRINE HCL 5 MG PO TABS: 5.0000 mg | ORAL_TABLET | ORAL | Status: DC

## 2016-10-15 MED ORDER — GABAPENTIN 300 MG PO CAPS
300.0000 mg | ORAL_CAPSULE | Freq: Three times a day (TID) | ORAL | Status: DC
  Administered 2016-10-15 – 2016-10-16 (×4): 300 mg via ORAL
  Filled 2016-10-15 (×4): qty 1

## 2016-10-15 MED ORDER — PANTOPRAZOLE SODIUM 40 MG PO TBEC
40.0000 mg | DELAYED_RELEASE_TABLET | Freq: Every day | ORAL | Status: DC
  Administered 2016-10-15 – 2016-10-16 (×2): 40 mg via ORAL
  Filled 2016-10-15 (×2): qty 1

## 2016-10-15 MED ORDER — SODIUM CHLORIDE 0.9 % IV BOLUS (SEPSIS): 500.0000 mL | INTRAVENOUS | Status: AC

## 2016-10-15 MED ORDER — HYDROXYZINE HCL 10 MG PO TABS
10.0000 mg | ORAL_TABLET | Freq: Three times a day (TID) | ORAL | Status: DC | PRN
  Administered 2016-10-15: 10 mg via ORAL
  Filled 2016-10-15 (×2): qty 1

## 2016-10-15 MED ORDER — METOCLOPRAMIDE HCL 10 MG PO TABS
10.0000 mg | ORAL_TABLET | Freq: Three times a day (TID) | ORAL | Status: DC
  Administered 2016-10-15 – 2016-10-16 (×4): 10 mg via ORAL
  Filled 2016-10-15 (×4): qty 1

## 2016-10-15 MED ORDER — ONDANSETRON HCL 4 MG/2ML IJ SOLN
4.0000 mg | Freq: Four times a day (QID) | INTRAMUSCULAR | Status: DC | PRN
  Administered 2016-10-15: 4 mg via INTRAVENOUS
  Filled 2016-10-15: qty 2

## 2016-10-15 MED ORDER — SODIUM CHLORIDE 0.9 % IV BOLUS (SEPSIS)
500.0000 mL | Freq: Once | INTRAVENOUS | Status: AC
  Administered 2016-10-15: 500 mL via INTRAVENOUS

## 2016-10-15 MED ORDER — ATORVASTATIN CALCIUM 80 MG PO TABS
80.0000 mg | ORAL_TABLET | Freq: Every day | ORAL | Status: DC
  Administered 2016-10-15: 80 mg via ORAL
  Filled 2016-10-15: qty 1

## 2016-10-15 MED ORDER — LANTHANUM CARBONATE 500 MG PO CHEW
1000.0000 mg | CHEWABLE_TABLET | Freq: Three times a day (TID) | ORAL | Status: DC
  Administered 2016-10-15 – 2016-10-16 (×3): 1000 mg via ORAL
  Filled 2016-10-15 (×3): qty 2

## 2016-10-15 MED ORDER — INSULIN ASPART 100 UNIT/ML ~~LOC~~ SOLN
0.0000 [IU] | Freq: Three times a day (TID) | SUBCUTANEOUS | Status: DC
  Administered 2016-10-15 (×2): 9 [IU] via SUBCUTANEOUS
  Administered 2016-10-16: 5 [IU] via SUBCUTANEOUS

## 2016-10-15 MED ORDER — CALCIUM ACETATE (PHOS BINDER) 667 MG PO CAPS
1334.0000 mg | ORAL_CAPSULE | Freq: Three times a day (TID) | ORAL | Status: DC
  Administered 2016-10-15 – 2016-10-16 (×3): 1334 mg via ORAL
  Filled 2016-10-15 (×3): qty 2

## 2016-10-15 NOTE — H&P (Signed)
History and Physical    Dale [name] JZP:[id number] DOB: 03/03/1966 DOA: 10/14/2016  PCP: [name], MD   Patient coming from: Home  Chief Complaint: Lightheadedness, malaise, fatigue  HPI: Dale [name] is a 51 y.o. male with medical history significant for insulin-dependent diabetes mellitus, chronic diastolic CHF, CAD with stents, hypothyroidism, chronic pain, and end-stage renal disease on HD, now presenting to the emergency department with lightheadedness, fatigue, and malaise in the setting of persistent hypotension. Patient reports that he has had intermittent lightheadedness and fatigue for the past 1-2 weeks in the setting of low blood pressures. He has been taking Midrin 3 times daily and reports that is dialysis dry weight was recently increased. He was at dialysis today, reportedly completed session, but there was no fluid removal due to low blood pressures. Vision denies any fevers, chills, chest pain, or headache. He denies melena or hematochezia. He reports some loose stools recently following constipation for which she was taking laxatives. Appetite has been good.  ED Course: Upon arrival to the ED, patient is found to be afebrile, saturating adequately on room air, mildly tachypneic, and with MAP ranging from 40-60. EKG features a normal sinus rhythm and chest x-ray features low volumes and cardiomegaly. Chemistry panel is largely unremarkable and CBC with stable anemia, hemoglobin low 0.7, but with MCV increased from priors, now 109.1. Lactic acid is elevated to 2.43. Noncontrast head CT is negative for acute intracranial hemorrhage, but notable for a subcentimeter right lentiform nucleus hypodensity, possibly representing a subacute versus chronic lacunar infarct. Patient was treated with 500 mL normal saline x2. MAP remained 40-60. ED physician discussed the case with critical care who indicated that this patient does not need ICU and should be admitted to the stepdown unit  by hospitalist. Patient will be admitted for persistent hypotension in a patient with end-stage renal disease and anuria.   Review of Systems:  All other systems reviewed and apart from HPI, are negative.  Past Medical History:  Diagnosis Date  . Anemia of chronic disease   . Arthritis    "hands, right knee" (03/19/2014) no cartlidge in right knee  . CAD (coronary artery disease)    a. Abnl nuc ->LHC (11/15):  mid to dist Dx 80%, mid RCA 99% (functional CTO), dist RCA 80% >> PCI: balloon angioplasty to mid RCA (could not deliver stent) - staged PCI Rotoblator atherectomy/DES to [facility]. b. NSTEMI 12/2015 s/p DES to diagonal.  . Cholecystitis    a. 08/27/2011  . Chronic diastolic CHF (congestive heart failure) ([place])    a. Echo 3/13:  EF 55-60%;  b. Echo (11/15):  Mild LVH, EF 60-65%, no RWMA, Gr 1 DD, MAC, mild LAE, normal RVF, mild RAE;  c.  Echo 5/16:  severe LVH, EF 55-60%, no RWMA, Gr 1 DD, MAC, mild LAE  . Claustrophobia    when things get around his face.   . Complication of anesthesia   . Difficult intubation    only once in 2015 at [facility] haven't had a problem since then  . ESRD (end stage renal disease) ([place])    a. 1995 s/p cadaveric transplant w/ susbequent failure after 18 yrs;  b.Dialysis initiated 07/2011 [facility] M[facility]F  . Fibromyalgia   . Gastroparesis   . GERD (gastroesophageal reflux disease)   . Gout    PMH  . History of blood transfusion   . HLD (hyperlipidemia)   . Hypertension   . Hypothyroidism   . Inguinal  lymphadenopathy    a. bilateral - s/p biopsy 07/2011  . Insulin dependent diabetes mellitus (HCC)    Type I  . Morbid obesity ([place])   . OSA (obstructive sleep apnea)    adjustable bed  . PAD (peripheral artery disease) (HCC)    a. s/p L BKA.  [name] Kitchen Pericardial effusion    a.  Small by CT 08/22/11;  b.  Large by CT 08/27/11. c. Not seen on 11/2015 echo.  . Pneumonia ~ 2007  . PONV (postoperative nausea and vomiting)    vomited once after a procedure.  .  Tracheobronchomalacia     Past Surgical History:  Procedure Laterality Date  . A/V SHUNTOGRAM Left 07/26/2016   Procedure: A/V Fistulagram;  Surgeon: Waynetta Sandy, MD;  Location: [place] [facility];  Service: Cardiovascular;  Laterality: Left;  . AMPUTATION OF REPLICATED TOES Right   . ANGIOPLASTY  04/09/2014   RCA   . APPENDECTOMY  ~ 2006  . ARTERIOVENOUS GRAFT PLACEMENT Left 1993?   forearm  . ARTERIOVENOUS GRAFT PLACEMENT Right 1993?   leg  . ARTERIOVENOUS GRAFT PLACEMENT Left    Thigh  . Arteriovenous Graft Removed Left    Thigh  . AV FISTULA PLACEMENT  07/29/2011   Procedure: ARTERIOVENOUS (AV) FISTULA CREATION;  Surgeon: Mal Misty, MD;  Location: [place];  Service: Vascular;  Laterality: Right;  Brachial cephalic  . AV FISTULA PLACEMENT Left 01/20/2015   Procedure: LIGATION OF RIGHT ARTERIOVENOUS FISTULA WITH EXCISION OF ANEURYSM;  Surgeon: Angelia Mould, MD;  Location: [place];  Service: Vascular;  Laterality: Left;  . AV FISTULA PLACEMENT Left 04/30/2015   Procedure: CREATION OF LEFT ARM BRACHIO-CEPHALIC ARTERIOVENOUS (AV) FISTULA ;  Surgeon: Angelia Mould, MD;  Location: [place];  Service: Vascular;  Laterality: Left;  . AV Graft PLACEMENT Left 1993?   "attempted one in my wrist; didn't take"  . BELOW KNEE LEG AMPUTATION Left 2010  . CARDIAC CATHETERIZATION  03/19/2014  . CARDIAC CATHETERIZATION  03/19/2014   Procedure: CORONARY BALLOON ANGIOPLASTY;  Surgeon: Burnell Blanks, MD;  Location: [facility] CATH LAB;  Service: Cardiovascular;;  . CARDIAC CATHETERIZATION N/A 01/01/2016   Procedure: Left Heart Cath and Coronary Angiography;  Surgeon: Troy Sine, MD;  Location: [place] [facility];  Service: Cardiovascular;  Laterality: N/A;  . CARDIAC CATHETERIZATION N/A 01/01/2016   Procedure: Coronary Stent Intervention;  Surgeon: Troy Sine, MD;  Location: [place] [facility];  Service: Cardiovascular;  Laterality: N/A;  . CARPAL TUNNEL RELEASE Bilateral  after 2005  . CATARACT EXTRACTION W/ INTRAOCULAR LENS  IMPLANT, BILATERAL Bilateral 1990's  . COLONOSCOPY  08/29/2011   Procedure: COLONOSCOPY;  Surgeon: Jerene Bears, MD;  Location: [place];  Service: Gastroenterology;  Laterality: N/A;  . COLONOSCOPY WITH PROPOFOL N/A 04/26/2016   Procedure: COLONOSCOPY WITH PROPOFOL;  Surgeon: Jerene Bears, MD;  Location: [facility] ENDOSCOPY;  Service: Gastroenterology;  Laterality: N/A;  . CORONARY ANGIOPLASTY WITH STENT PLACEMENT  04/09/2014       ptca/des mid lad   . ESOPHAGOGASTRODUODENOSCOPY N/A 12/25/2015   Procedure: ESOPHAGOGASTRODUODENOSCOPY (EGD);  Surgeon: Mauri Pole, MD;  Location: [facility] ENDOSCOPY;  Service: Endoscopy;  Laterality: N/A;  bedside  . ESOPHAGOGASTRODUODENOSCOPY (EGD) WITH PROPOFOL N/A 04/26/2016   Procedure: ESOPHAGOGASTRODUODENOSCOPY (EGD) WITH PROPOFOL;  Surgeon: Jerene Bears, MD;  Location: [facility] ENDOSCOPY;  Service: Gastroenterology;  Laterality: N/A;  . EYE SURGERY Bilateral    cataract  . FEMORAL ARTERY REPAIR  04/09/2014   ANGIOSEAL  .  INSERTION OF DIALYSIS CATHETER  08/01/2011   Procedure: INSERTION OF DIALYSIS CATHETER;  Surgeon: Rosetta Posner, MD;  Location: [place];  Service: Vascular;  Laterality: Right;  insertion of dialysis catheter on right internal jugular vein  . INSERTION OF DIALYSIS CATHETER Right 01/20/2015   Procedure: INSERTION OF DIALYSIS CATHETER - RIGHT INTERNAL JUGULAR ;  Surgeon: Angelia Mould, MD;  Location: [place];  Service: Vascular;  Laterality: Right;  . KIDNEY TRANSPLANT  04/25/1993  . LEFT HEART CATHETERIZATION WITH CORONARY ANGIOGRAM N/A 03/19/2014   Procedure: LEFT HEART CATHETERIZATION WITH CORONARY ANGIOGRAM;  Surgeon: Burnell Blanks, MD;  Location: [facility] CATH LAB;  Service: Cardiovascular;  Laterality: N/A;  . LYMPH NODE BIOPSY  08/10/2011   Procedure: LYMPH NODE BIOPSY;  Surgeon: Merrie Roof, MD;  Location: [place];  Service: General;  Laterality: Left;  left groin lymph biopsy  . NEUROPLASTY  / TRANSPOSITION ULNAR NERVE AT ELBOW Left after 2005  . PATCH ANGIOPLASTY Left 08/09/2016   Procedure: LEFT  ARTERIOVENOUS FISTULA PATCH ANGIOPLASTY;  Surgeon: Waynetta Sandy, MD;  Location: [place];  Service: Vascular;  Laterality: Left;  . PERCUTANEOUS CORONARY ROTOBLATOR INTERVENTION (PCI-R) N/A 04/09/2014   Procedure: PERCUTANEOUS CORONARY ROTOBLATOR INTERVENTION (PCI-R);  Surgeon: Burnell Blanks, MD;  Location: [facility] CATH LAB;  Service: Cardiovascular;  Laterality: N/A;  . PERIPHERAL VASCULAR CATHETERIZATION N/A 12/25/2014   Procedure: Upper Extremity Venography;  Surgeon: Conrad Baltic, MD;  Location: [place] [facility];  Service: Cardiovascular;  Laterality: N/A;  . PERIPHERAL VASCULAR CATHETERIZATION Left 02/24/2016   Procedure: Fistulagram;  Surgeon: Waynetta Sandy, MD;  Location: [place] [facility];  Service: Cardiovascular;  Laterality: Left;  . PERIPHERAL VASCULAR CATHETERIZATION Left 02/24/2016   Procedure: Peripheral Vascular Intervention;  Surgeon: Waynetta Sandy, MD;  Location: [place] [facility];  Service: Cardiovascular;  Laterality: Left;  AV FISTULA  . PERITONEAL CATHETER INSERTION    . PERITONEAL CATHETER REMOVAL    . REVISON OF ARTERIOVENOUS FISTULA Right 4/33/2951   Procedure: PLICATION / REVISION OF ARTERIOVENOUS FISTULA;  Surgeon: Angelia Mould, MD;  Location: [place];  Service: Vascular;  Laterality: Right;  . REVISON OF ARTERIOVENOUS FISTULA Left 09/01/2015   Procedure: SUPERFICIALIZATION OF LEFT ARM BRACHIOCEPHALIC ARTERIOVENOUS FISTULA;  Surgeon: Angelia Mould, MD;  Location: [place];  Service: Vascular;  Laterality: Left;  . REVISON OF ARTERIOVENOUS FISTULA Left 08/09/2016   Procedure: REVISION OF ARTERIAL ANASTOMOSIS OF ARTERIOVENOUS FISTULA;  Surgeon: Waynetta Sandy, MD;  Location: [place];  Service: Vascular;  Laterality: Left;  . SHOULDER ARTHROSCOPY Left ~ 2006   frozen  . TOE AMPUTATION Bilateral after 2005  .  UMBILICAL HERNIA REPAIR  1990's  . VENOGRAM Right 07/28/2011   Procedure: VENOGRAM;  Surgeon: Conrad Raymond, MD;  Location: [facility] CATH LAB;  Service: Cardiovascular;  Laterality: Right;  . VITRECTOMY Bilateral      reports that he has never smoked. He has never used smokeless tobacco. He reports that he drinks alcohol. He reports that he does not use drugs.  Allergies  Allergen Reactions  . Prednisone Other (See Comments)    Increases sugar levels  . Adhesive [Tape] Itching and Other (See Comments)    Blisters on arm from adhesive tape at dialysis   . Amoxicillin Itching and Rash    Has patient had a PCN reaction causing immediate rash, facial/tongue/throat swelling, SOB or lightheadedness with hypotension: Yes Has patient had a PCN reaction causing severe rash involving mucus  membranes or skin necrosis: No Has patient had a PCN reaction that required hospitalization No Has patient had a PCN reaction occurring within the last 10 years: No If all of the above answers are "NO", then may proceed with Cephalosporin use.  . Clindamycin/Lincomycin Hives and Itching  . Contrast Media [Iodinated Diagnostic Agents] Itching and Rash    Systemic itching and transient rash appearance within hours of receiving contrast    . Enalapril Rash and Cough  . Mobic [Meloxicam] Hives and Rash  . Morphine Hives, Nausea Only and Rash  . Brilinta [Ticagrelor] Nausea And Vomiting  . Ciprofloxacin Itching and Rash  . Clopidogrel Rash  . Codeine Hives, Itching and Rash  . Doxycycline Rash  . Hydrocodone Rash  . Levofloxacin Hives and Itching  . Oxycodone Rash  . Rocephin [Ceftriaxone] Itching    Family History  Problem Relation Age of Onset  . Colon polyps Father   . Diabetes Father   . Hypertension Father   . Hypertension Mother   . Diabetes Mother   . Hyperlipidemia Mother   . Breast cancer Maternal Aunt   . Bone cancer Maternal Aunt   . Liver cancer Maternal Aunt   . Malignant hyperthermia Neg Hx    . Heart attack Neg Hx   . Stroke Neg Hx   . Colon cancer Neg Hx   . Stomach cancer Neg Hx   . Pancreatic cancer Neg Hx   . Esophageal cancer Neg Hx      Prior to Admission medications   Medication Sig Start Date End Date Taking? Authorizing Provider  acetaminophen (TYLENOL) 500 MG tablet Take 1,000 mg by mouth every 4 (four) hours as needed for moderate pain or headache.  01/16/16  Yes [provider]  albuterol (PROVENTIL HFA;VENTOLIN HFA) 108 (90 Base) MCG/ACT inhaler Inhale 2 puffs into the lungs every 6 (six) hours as needed for wheezing or shortness of breath. 01/26/16  Yes Brand Males, MD  allopurinol (ZYLOPRIM) 100 MG tablet Take 200 mg by mouth daily.  03/14/12  Yes [provider]  aspirin EC 81 MG tablet Take 81 mg by mouth at bedtime.   Yes [provider]  atorvastatin (LIPITOR) 80 MG tablet Take 1 tablet (80 mg total) by mouth daily at 6 PM. 01/04/16  Yes Florencia Reasons, MD  calcium acetate (PHOSLO) 667 MG capsule Take 667-1,334 mg by mouth 3 (three) times daily with meals. Take 1382ms with meals and 6631m with snacks 09/19/12  Yes [provider]  docusate sodium (COLACE) 100 MG capsule Take 200 mg by mouth 2 (two) times daily.    Yes [provider]  fenofibrate (TRICOR) 48 MG tablet Take 48 mg by mouth daily.    Yes [provider]  fluticasone (FLONASE) 50 MCG/ACT nasal spray Place 2 sprays into both nostrils daily as needed for allergies.    Yes [provider]  FOSRENOL 1000 MG PACK Take 1 Package by mouth 3 (three) times daily.  09/14/16  Yes [provider]  gabapentin (NEURONTIN) 300 MG capsule Take 300 mg by mouth 3 (three) times daily.   Yes [provider]  GLUCAGON EMERGENCY 1 MG injection Inject 1 mg into the muscle daily as needed (low blood sugar).  01/19/12  Yes [provider]  HYDROmorphone (DILAUDID) 2 MG tablet Take 2 mg by mouth 3 (three) times daily as needed for moderate  pain or severe pain.   Yes [provider]  hydrOXYzine (ATARAX/VISTARIL) 10 MG tablet  Take 10 mg by mouth every 8 (eight) hours as needed for anxiety or vomiting.    Yes [provider]  insulin glargine (LANTUS) 100 UNIT/ML injection Inject 36 Units into the skin at bedtime.   Yes [provider]  insulin lispro (HUMALOG) 100 UNIT/ML injection Inject 1-20 Units into the skin 3 (three) times daily. Blood glucose - 120, 1 unit for every 20 points over 120    Yes [provider]  levothyroxine (SYNTHROID, LEVOTHROID) 112 MCG tablet Take 112 mcg by mouth every evening. 2 hours after dinner   Yes [provider]  loratadine (CLARITIN) 10 MG tablet Take 10 mg by mouth daily.   Yes [provider]  metoCLOPramide (REGLAN) 10 MG tablet Take 10 mg by mouth 4 (four) times daily -  with meals and at bedtime.   Yes [provider]  midodrine (PROAMATINE) 10 MG tablet Take 1 tablet (10 mg total) by mouth 3 (three) times daily. Takes 1 tab in mornings of dialysis and takes 2 tabs 30 mins before starting dialysis (m,w,f) 04/14/16  Yes Elgergawy, Silver Huguenin, MD  multivitamin (RENA-VIT) TABS tablet Take 1 tablet by mouth daily.   Yes [provider]  nitroGLYCERIN (NITROSTAT) 0.4 MG SL tablet Place 1 tablet (0.4 mg total) under the tongue every 5 (five) minutes as needed for chest pain. 02/15/16  Yes Burnell Blanks, MD  Nutritional Supplements (FEEDING SUPPLEMENT, NEPRO CARB STEADY,) LIQD Take 237 mLs by mouth daily. 01/04/16  Yes Florencia Reasons, MD  omeprazole (PRILOSEC OTC) 20 MG tablet Take 20 mg by mouth daily.   Yes [provider]  OXYGEN Inhale 3 L into the lungs at bedtime.   Yes [provider]  Polyethyl Glycol-Propyl Glycol (SYSTANE) 0.4-0.3 % SOLN Place 1 drop into both eyes as needed (dry eyes).   Yes [provider]  polyethylene glycol (MIRALAX / GLYCOLAX) packet Take 17 g by mouth daily. Patient taking  differently: Take 17 g by mouth daily as needed for mild constipation.  02/06/16  Yes Regalado, Belkys A, MD  prasugrel (EFFIENT) 10 MG TABS tablet Take 10 mg by mouth daily.   Yes [provider]  promethazine (PHENERGAN) 25 MG tablet Take 1 tablet (25 mg total) by mouth every 6 (six) hours as needed for nausea or vomiting. For nausea 11/27/14  Yes Richardson Dopp T, PA-C  rOPINIRole (REQUIP) 2 MG tablet Take 2 mg by mouth every evening.    Yes [provider]  sodium chloride (MURO 128) 5 % ophthalmic ointment Place 1 application into both eyes at bedtime.    Yes [provider]  testosterone cypionate (DEPOTESTOSTERONE CYPIONATE) 200 MG/ML injection INJECT 1 ML INTO THE MUSCLE Q 14 DAYS 09/10/16  Yes [provider]  Wound Cleansers (WOUND [place]) LIQD Apply 1 application topically daily as needed (wound care).   Yes [provider]    Physical Exam: Vitals:   10/15/16 0145 10/15/16 0200 10/15/16 0205 10/15/16 0210  BP: (!) 87/54 (!) 87/44 (!) 97/44 (!) 91/51  Pulse: 97  93 97  Resp: _0 Temp:      SpO2: 95%  97% 98%      Constitutional: no respiratory distress. Appears uncomfortable, obese.  Eyes: PERTLA, lids and conjunctivae normal ENMT: Mucous membranes are moist. Posterior pharynx clear of any exudate or lesions.   Neck: normal, supple, no masses, no thyromegaly Respiratory: clear to auscultation bilaterally, no wheezing, no crackles. Normal respiratory effort.  Cardiovascular: S1 & S2 heard, regular rate and rhythm, soft systolic murmur at RUSB. JVP not well-visualized. Abdomen: No distension, no tenderness, no masses palpated. Bowel sounds normal.  Musculoskeletal: no clubbing / cyanosis. Status-post left BKA.  Skin: no significant rashes, lesions, ulcers. Warm, dry, well-perfused. Neurologic: CN 2-12 grossly intact. Sensation intact, DTR normal. Strength 5/5 in all 4 limbs.  Psychiatric: Alert and oriented x 3. Calm and  cooperative.     Labs on Admission: I have personally reviewed following labs and imaging studies  CBC:  Recent Labs Lab 10/14/16 2307  WBC 7.6  NEUTROABS 5.1  HGB 12.7*  HCT 40.9  MCV 109.1*  PLT 756   Basic Metabolic Panel:  Recent Labs Lab 10/14/16 2307  NA 141  K 3.9  CL 97*  CO2 31  GLUCOSE 140*  BUN 24*  CREATININE 6.07*  CALCIUM 9.5   GFR: Estimated Creatinine Clearance: 19.3 mL/min (A) (by C-G formula based on SCr of 6.07 mg/dL (H)). Liver Function Tests:  Recent Labs Lab 10/14/16 2307  AST 36  ALT 30  ALKPHOS 96  BILITOT 0.4  PROT 7.5  ALBUMIN 3.6   No results for input(s): LIPASE, AMYLASE in the last 168 hours. No results for input(s): AMMONIA in the last 168 hours. Coagulation Profile: No results for input(s): INR, PROTIME in the last 168 hours. Cardiac Enzymes: No results for input(s): CKTOTAL, CKMB, CKMBINDEX, TROPONINI in the last 168 hours. BNP (last 3 results) No results for input(s): PROBNP in the last 8760 hours. HbA1C: No results for input(s): HGBA1C in the last 72 hours. CBG: No results for input(s): GLUCAP in the last 168 hours. Lipid Profile: No results for input(s): CHOL, HDL, LDLCALC, TRIG, CHOLHDL, LDLDIRECT in the last 72 hours. Thyroid Function Tests: No results for input(s): TSH, T4TOTAL, FREET4, T3FREE, THYROIDAB in the last 72 hours. Anemia Panel: No results for input(s): VITAMINB12, FOLATE, FERRITIN, TIBC, IRON, RETICCTPCT in the last 72 hours. Urine analysis:    Component Value Date/Time   COLORURINE YELLOW 06/16/2013 2059   APPEARANCEUR CLEAR 06/16/2013 2059   LABSPEC 1.018 06/16/2013 2059   PHURINE 5.5 06/16/2013 2059   GLUCOSEU NEGATIVE 06/16/2013 2059   HGBUR SMALL (A) 06/16/2013 2059   BILIRUBINUR NEGATIVE 06/16/2013 2059   [place] NEGATIVE 06/16/2013 2059   PROTEINUR 100 (A) 06/16/2013 2059   UROBILINOGEN 0.2 06/16/2013 2059   NITRITE NEGATIVE 06/16/2013 2059   LEUKOCYTESUR NEGATIVE 06/16/2013 2059     Sepsis Labs: _0 (procalcitonin:4,lacticidven:4) )No results found for this or any previous visit (from the past 240 hour(s)).   Radiological Exams on Admission: Ct Head Wo Contrast  Result Date: 10/15/2016 CLINICAL DATA:  51 year old male with frontal headache and dizziness. EXAM: CT HEAD WITHOUT CONTRAST TECHNIQUE: Contiguous axial images were obtained from the base of the skull through the vertex without intravenous contrast. COMPARISON:  None. FINDINGS: Brain: The ventricles and sulci appropriate size for the patient's age. Minimal periventricular and deep white matter chronic microvascular ischemic changes noted. A subcentimeter hypodense focus in the right lentiform nucleus is age indeterminate but may represent a subacute or chronic lacunar infarct. Acute infarct is less likely but not entirely excluded. If there is high clinical concern for acute infarct MRI is recommended for further characterization. There is no acute intracranial hemorrhage. No mass effect or midline shift noted. No extra-axial fluid collection. Vascular: No hyperdense vessel or unexpected calcification. There is calcification of the vertebral arteries at the foramen magnum. Skull:  The calvarium is intact. Sinuses/Orbits: There is mild  diffuse mucoperiosteal thickening of paranasal sinuses. There is opacification of multiple mastoid air cells bilaterally. No air-fluid levels. Other: None IMPRESSION: 1. No acute intracranial hemorrhage. 2. Minimal chronic microvascular ischemic changes. Subcentimeter right lentiform nucleus hypodensity likely represents a subacute or chronic lacunar infarct. If there is high clinical concern for acute ischemia, MRI is recommended for further evaluation. 3. Chronic appearing paranasal sinus disease. Electronically Signed   By: Anner Crete M.D.   On: 10/15/2016 00:51   Dg Chest Portable 1 View  Result Date: 10/14/2016 CLINICAL DATA:  Hypotension tonight.  Cough for 3 weeks. EXAM:  PORTABLE CHEST 1 VIEW COMPARISON:  Radiograph 04/13/2016.  Chest CT 01/28/2016 FINDINGS: Persistent low lung volumes. Unchanged cardiomegaly and mediastinal contours. No evidence pulmonary edema. Streaky bibasilar atelectasis. Vascular stent in left subclavian. No consolidation, large pleural effusion, or pneumothorax. No acute osseous abnormalities are seen. IMPRESSION: Low lung volumes with persistent cardiomegaly. Streaky bibasilar atelectasis. Electronically Signed   By: Jeb Levering M.D.   On: 10/14/2016 23:31    EKG: Independently reviewed. Normal sinus rhythm.   Assessment/Plan  1. Hypotension  - Pt presents with persistent hypotension; symptomatic with lightheadedness and presyncope  - Has been ongoing problem, treated with midodrine 10 mg TID, recently had dry-weight at HD increased  - MAP 40-60 range in ED with elevated lactate, normal EKG  - Given 500 cc NS x2 in ED, MAP persisted 40-60  - ED physician discussed with PCCM in light of pt's anuria, CHF, and evidence of poor tissue perfusion; SDU admission was advised  - Plan to exclude acute ischemic etiology with serial troponin measurements, structural cardiac etio with TTE, check random cortisol level, blood cultures  - Give 500 cc NS bolus prn    2. ESRD on HD  - Pt completed HD on 10/14/16; no volume was removed due to hypotension  - No emergent indication for HD on admission  - Pt is anuric and requiring fluid boluses in order to maintain MAP close to 60 and is at risk for vol o/l  - Follow daily wts and I/O   3. Chronic diastolic CHF - TTE (46/50/35) with EF 55-60%, mild LVH, no WMA's, grade 2 diastolic dysfunction  - Pt is anuric and requiring fluid boluses in order to maintain MAP close to 60 and is at risk for vol o/l  - Follow daily wts and I/O  - No longer on beta-blocker d/t hypotension    4. CAD - No anginal complaints  - EKG without acute changes  - Troponin undetectable - Continue Lipitor, Effient, and ASA  81  5. Insulin-dependent DM  - A1c 5.7% in November '17  - Managed at home with Lantus 36 units qD and Humalog 1-20 TID per sliding-scale  - Check CBG with meals and qHS  - Continue Lantus 36 units qD with Novolog per low-intensity sliding-scale  6. Hypothyroidism  - TSH wnl - Continue Synthroid    7. Chronic pain - Attributed to low back degenerative disease  - Stable  - Continue home-regimen as tolerated     DVT prophylaxis: sq heparin  Code Status: Full  Family Communication: Wife updated at bedside Disposition Plan: Admit to SDU Consults called: ED physician discussed with PCCM Admission status: Inpatient    Vianne Bulls, MD [facility] Pager [phone]  If 7PM-7AM, please contact night-coverage [email or web address] Password [email or web address]  10/15/2016, 2:25 AM

## 2016-10-15 NOTE — ED Notes (Signed)
Pt does not produce urine

## 2016-10-15 NOTE — ED Notes (Signed)
Admitting MD at bedside.

## 2016-10-15 NOTE — ED Notes (Signed)
Attempted report x1. 

## 2016-10-15 NOTE — Progress Notes (Signed)
PROGRESS NOTE                                                                                                                                                                                                             Patient Demographics:    Dale [name], is a 51 y.o. male, DOB - 09-May-1965, GHW:[id number]  Admit date - 10/14/2016   Admitting Physician Vianne Bulls, MD  Outpatient Primary MD for the patient is [name], MD  LOS - 0  Outpatient specialist : Renal Dr. Clover Mealy  Chief Complaint  Patient presents with  . Dizziness       Brief Narrative   Patient admitted earlier today by Dr. Myna Hidalgo, chart, imaging, labs were reviewed   Subjective:    Dale [name] today has, No headache, No chest pain, No abdominal pain , complains of back pain, chronic   Assessment  & Plan :    Principal Problem:   Hypotension (arterial) Active Problems:   OSA (obstructive sleep apnea)   Anemia   Coronary artery disease involving native coronary artery of native heart with unstable angina pectoris (HCC)   ESRD on dialysis (HCC)   Chronic diastolic heart failure (HCC)   IDDM (insulin dependent diabetes mellitus) (HCC)   Hypotension  - Pt presents with persistent hypotension; symptomatic with lightheadedness and presyncope  - Has been ongoing problem, treated with midodrine 10 mg TID, recently had dry-weight at HD increased  - He did respond to fluid boluses overnight, further IV fluids given he is anuric . - Plan to exclude acute ischemic etiology with serial troponin measurements, structural cardiac etio with TTE, check random cortisol level, blood cultures   ESRD on HD  - Pt completed HD on 10/14/16; no volume was removed due to hypotension  - No emergent indication for HD on admission  - Pt is anuric and requiring fluid boluses in order to maintain MAP close to 60 and is at risk for vol o/l  - Follow daily wts and I/O   Chronic diastolic  CHF - TTE (78/93/81) with EF 55-60%, mild LVH, no WMA's, grade 2 diastolic dysfunction  - Pt is anuric and requiring fluid boluses in order to maintain MAP close to 60 and is at risk for vol o/l  - Follow daily wts and I/O  - No longer  on beta-blocker d/t hypotension    CAD - No anginal complaints  - EKG without acute changes  - Troponin undetectable - Continue Lipitor, Effient, and ASA 81  Insulin-dependent DM  - A1c 5.7% in November '17  - Managed at home with Lantus 36 units qD and Humalog 1-20 TID per sliding-scale  - Check CBG with meals and qHS  - Continue Lantus 36 units qD with Novolog per low-intensity sliding-scale  Hypothyroidism  - TSH wnl - Continue Synthroid    Chronic pain - Attributed to low back degenerative disease  - Stable  - Continue home-regimen as tolerated     Code Status : Full  Family Communication  : None at bedside  Disposition Plan  : home when stable  Consults  :  None   Procedures  : None  DVT Prophylaxis  :  Heparin  Lab Results  Component Value Date   PLT 241 10/15/2016    Antibiotics  :  Anti-infectives    None        Objective:   Vitals:   10/15/16 0715 10/15/16 0730 10/15/16 0800 10/15/16 1027  BP: (!) 101/51 (!) 101/40 (!) 119/54 (!) 110/52  Pulse: 88 87 92   Resp: (!) 21 12 13    Temp:   98.6 F (37 C)   TempSrc:   Oral   SpO2: 98% 97% 97%   Weight:   127.4 kg (280 lb 14.4 oz)   Height:   5\' 8"  (1.727 m)     Wt Readings from Last 3 Encounters:  10/15/16 127.4 kg (280 lb 14.4 oz)  10/04/16 131.1 kg (289 lb)  08/05/16 129.9 kg (286 lb 6.4 oz)     Intake/Output Summary (Last 24 hours) at 10/15/16 1115 Last data filed at 10/15/16 0044  Gross per 24 hour  Intake              500 ml  Output                0 ml  Net              500 ml     Physical Exam  Awake Alert, Oriented X 3,   Symmetrical Chest wall movement, Good air movement bilaterally, CTAB RRR,No Gallops,Rubs or new Murmurs, No  Parasternal Heave +ve B.Sounds, Abd Soft, No tenderness, No rebound - guarding or rigidity. No Cyanosis, Clubbing or edema And right lower extremity, left BKA.    Data Review:    CBC  Recent Labs Lab 10/14/16 2307 10/15/16 0703  WBC 7.6 6.0  HGB 12.7* 11.5*  HCT 40.9 37.9*  PLT 281 241  MCV 109.1* 108.6*  MCH 33.9 33.0  MCHC 31.1 30.3  RDW 15.5 15.5  LYMPHSABS 1.9 1.7  MONOABS 0.4 0.2  EOSABS 0.2 0.2  BASOSABS 0.0 0.0    Chemistries   Recent Labs Lab 10/14/16 2307 10/15/16 0703  NA 141 137  K 3.9 4.0  CL 97* 98*  CO2 31 28  GLUCOSE 140* 245*  BUN 24* 27*  CREATININE 6.07* 6.76*  CALCIUM 9.5 9.2  AST 36  --   ALT 30  --   ALKPHOS 96  --   BILITOT 0.4  --    ------------------------------------------------------------------------------------------------------------------ No results for input(s): CHOL, HDL, LDLCALC, TRIG, CHOLHDL, LDLDIRECT in the last 72 hours.  Lab Results  Component Value Date   HGBA1C 5.7 (H) 02/25/2016   ------------------------------------------------------------------------------------------------------------------  Recent Labs  10/15/16 0256  TSH 2.970   ------------------------------------------------------------------------------------------------------------------  Recent Labs  10/15/16 0256  VITAMINB12 499    Coagulation profile No results for input(s): INR, PROTIME in the last 168 hours.  No results for input(s): DDIMER in the last 72 hours.  Cardiac Enzymes  Recent Labs Lab 10/15/16 0256 10/15/16 0703  TROPONINI <0.03 <0.03   ------------------------------------------------------------------------------------------------------------------    Component Value Date/Time   BNP 1,185.6 (H) 02/02/2016 0930    Inpatient Medications  Scheduled Meds: . allopurinol  200 mg Oral Daily  . [START ON 10/16/2016] aspirin EC  81 mg Oral QHS  . atorvastatin  80 mg Oral q1800  . calcium acetate  1,334 mg Oral TID  WC  . docusate sodium  200 mg Oral BID  . feeding supplement (NEPRO CARB STEADY)  237 mL Oral Q24H  . fenofibrate  54 mg Oral Daily  . gabapentin  300 mg Oral TID  . heparin  5,000 Units Subcutaneous Q8H  . insulin aspart  0-5 Units Subcutaneous QHS  . insulin aspart  0-9 Units Subcutaneous TID WC  . insulin glargine  36 Units Subcutaneous QHS  . lanthanum  1,000 mg Oral TID WC  . levothyroxine  112 mcg Oral QPM  . loratadine  10 mg Oral Daily  . metoCLOPramide  10 mg Oral TID WC & HS  . [START ON 10/17/2016] midodrine  10 mg Oral Q M,W,F-1800  . [START ON 10/17/2016] midodrine  5 mg Oral Once per day on Mon Wed Fri  . multivitamin  1 tablet Oral QHS  . pantoprazole  40 mg Oral Daily  . prasugrel  10 mg Oral Daily  . rOPINIRole  2 mg Oral QPM  . sodium chloride  1 drop Both Eyes QHS  . sodium chloride flush  3 mL Intravenous Q12H   Continuous Infusions: . sodium chloride    . sodium chloride     PRN Meds:.acetaminophen, calcium acetate, HYDROmorphone, hydrOXYzine, ondansetron **OR** ondansetron (ZOFRAN) IV, polyethylene glycol, polyvinyl alcohol, sodium chloride  Micro Results Recent Results (from the past 240 hour(s))  MRSA PCR Screening     Status: Abnormal   Collection Time: 10/15/16  8:33 AM  Result Value Ref Range Status   MRSA by PCR POSITIVE (A) NEGATIVE Final    Comment:        The GeneXpert MRSA Assay (FDA approved for NASAL specimens only), is one component of a comprehensive MRSA colonization surveillance program. It is not intended to diagnose MRSA infection nor to guide or monitor treatment for MRSA infections. RESULT CALLED TO, READ BACK BY AND VERIFIED WITH: C. KOLOMBO [id number] 06.23.2018 N. MORRIS     Radiology Reports Ct Head Wo Contrast  Result Date: 10/15/2016 CLINICAL DATA:  51 year old male with frontal headache and dizziness. EXAM: CT HEAD WITHOUT CONTRAST TECHNIQUE: Contiguous axial images were obtained from the base of the skull through the vertex  without intravenous contrast. COMPARISON:  None. FINDINGS: Brain: The ventricles and sulci appropriate size for the patient's age. Minimal periventricular and deep white matter chronic microvascular ischemic changes noted. A subcentimeter hypodense focus in the right lentiform nucleus is age indeterminate but may represent a subacute or chronic lacunar infarct. Acute infarct is less likely but not entirely excluded. If there is high clinical concern for acute infarct MRI is recommended for further characterization. There is no acute intracranial hemorrhage. No mass effect or midline shift noted. No extra-axial fluid collection. Vascular: No hyperdense vessel or unexpected calcification. There is calcification of the vertebral arteries at the foramen magnum. Skull:  The calvarium  is intact. Sinuses/Orbits: There is mild diffuse mucoperiosteal thickening of paranasal sinuses. There is opacification of multiple mastoid air cells bilaterally. No air-fluid levels. Other: None IMPRESSION: 1. No acute intracranial hemorrhage. 2. Minimal chronic microvascular ischemic changes. Subcentimeter right lentiform nucleus hypodensity likely represents a subacute or chronic lacunar infarct. If there is high clinical concern for acute ischemia, MRI is recommended for further evaluation. 3. Chronic appearing paranasal sinus disease. Electronically Signed   By: Anner Crete M.D.   On: 10/15/2016 00:51   Dg Chest Portable 1 View  Result Date: 10/14/2016 CLINICAL DATA:  Hypotension tonight.  Cough for 3 weeks. EXAM: PORTABLE CHEST 1 VIEW COMPARISON:  Radiograph 04/13/2016.  Chest CT 01/28/2016 FINDINGS: Persistent low lung volumes. Unchanged cardiomegaly and mediastinal contours. No evidence pulmonary edema. Streaky bibasilar atelectasis. Vascular stent in left subclavian. No consolidation, large pleural effusion, or pneumothorax. No acute osseous abnormalities are seen. IMPRESSION: Low lung volumes with persistent cardiomegaly.  Streaky bibasilar atelectasis. Electronically Signed   By: Jeb Levering M.D.   On: 10/14/2016 23:31    Time Spent in minutes  No charge   [facility], Jasnoor Trussell M.D on 10/15/2016 at 11:15 AM  Between 7am to 7pm - Pager - [phone]  After 7pm go to [email or web address] - password [facility]  [facility] -  Office  [phone]

## 2016-10-15 NOTE — ED Notes (Signed)
Pt pulse rate and oxygen levels at 0110 picked up artifact.

## 2016-10-15 NOTE — Progress Notes (Signed)
Patient stable on room air at this time, BiPAP not placed on at this time. RT will continue to monitor as needed.

## 2016-10-16 ENCOUNTER — Other Ambulatory Visit (HOSPITAL_COMMUNITY): Payer: Medicare Other

## 2016-10-16 LAB — GLUCOSE, CAPILLARY
Glucose-Capillary: 266 mg/dL — ABNORMAL HIGH (ref 65–99)
Glucose-Capillary: 267 mg/dL — ABNORMAL HIGH (ref 65–99)

## 2016-10-16 MED ORDER — MUPIROCIN 2 % EX OINT: 1.0000 "application " | TOPICAL_OINTMENT | Freq: Two times a day (BID) | CUTANEOUS | 0 refills | Status: DC

## 2016-10-16 NOTE — Discharge Summary (Signed)
Dale [name], is a 51 y.o. male  DOB 1965-10-08  MRN [id number].  Admission date:  10/14/2016  Admitting Physician  Vianne Bulls, MD  Discharge Date:  10/16/2016   Primary MD  [name], MD  Recommendations for primary care physician for things to follow:  - Patient to continue with hemodialysis on previous schedule MWF  Admission Diagnosis  Passed out, Blood Pressure 67/43, high heart rate   Discharge Diagnosis  Passed out, Blood Pressure 67/43, high heart rate    Principal Problem:   Hypotension (arterial) Active Problems:   OSA (obstructive sleep apnea)   Anemia   Coronary artery disease involving native coronary artery of native heart with unstable angina pectoris (HCC)   ESRD on dialysis ([place])   Chronic diastolic heart failure (HCC)   IDDM (insulin dependent diabetes mellitus) ([place])      Past Medical History:  Diagnosis Date  . Anemia of chronic disease   . Arthritis    "hands, right knee" (03/19/2014) no cartlidge in right knee  . CAD (coronary artery disease)    a. Abnl nuc ->LHC (11/15):  mid to dist Dx 80%, mid RCA 99% (functional CTO), dist RCA 80% >> PCI: balloon angioplasty to mid RCA (could not deliver stent) - staged PCI Rotoblator atherectomy/DES to [facility]. b. NSTEMI 12/2015 s/p DES to diagonal.  . Cholecystitis    a. 08/27/2011  . Chronic diastolic CHF (congestive heart failure) ([place])    a. Echo 3/13:  EF 55-60%;  b. Echo (11/15):  Mild LVH, EF 60-65%, no RWMA, Gr 1 DD, MAC, mild LAE, normal RVF, mild RAE;  c.  Echo 5/16:  severe LVH, EF 55-60%, no RWMA, Gr 1 DD, MAC, mild LAE  . Claustrophobia    when things get around his face.   . Complication of anesthesia   . Difficult intubation    only once in 2015 at [facility] haven't had a problem since then  . ESRD (end stage renal disease) ([place])    a. 1995 s/p cadaveric transplant w/ susbequent failure after 18 yrs;   b.Dialysis initiated 07/2011 [facility] M-W-F  . Fibromyalgia   . Gastroparesis   . GERD (gastroesophageal reflux disease)   . Gout    PMH  . History of blood transfusion   . HLD (hyperlipidemia)   . Hypertension   . Hypothyroidism   . Inguinal lymphadenopathy    a. bilateral - s/p biopsy 07/2011  . Insulin dependent diabetes mellitus (HCC)    Type I  . Morbid obesity ([place])   . OSA (obstructive sleep apnea)    adjustable bed  . PAD (peripheral artery disease) (HCC)    a. s/p L BKA.  [name] Kitchen Pericardial effusion    a.  Small by CT 08/22/11;  b.  Large by CT 08/27/11. c. Not seen on 11/2015 echo.  . Pneumonia ~ 2007  . PONV (postoperative nausea and vomiting)    vomited once after a procedure.  Dale [name]     Past Surgical  History:  Procedure Laterality Date  . A/V SHUNTOGRAM Left 07/26/2016   Procedure: A/V Fistulagram;  Surgeon: Waynetta Sandy, MD;  Location: [place] CV LAB;  Service: Cardiovascular;  Laterality: Left;  . AMPUTATION OF REPLICATED TOES Right   . ANGIOPLASTY  04/09/2014   RCA   . APPENDECTOMY  ~ 2006  . ARTERIOVENOUS GRAFT PLACEMENT Left 1993?   forearm  . ARTERIOVENOUS GRAFT PLACEMENT Right 1993?   leg  . ARTERIOVENOUS GRAFT PLACEMENT Left    Thigh  . Arteriovenous Graft Removed Left    Thigh  . AV FISTULA PLACEMENT  07/29/2011   Procedure: ARTERIOVENOUS (AV) FISTULA CREATION;  Surgeon: Mal Misty, MD;  Location: [place];  Service: Vascular;  Laterality: Right;  Brachial cephalic  . AV FISTULA PLACEMENT Left 01/20/2015   Procedure: LIGATION OF RIGHT ARTERIOVENOUS FISTULA WITH EXCISION OF ANEURYSM;  Surgeon: Angelia Mould, MD;  Location: [place];  Service: Vascular;  Laterality: Left;  . AV FISTULA PLACEMENT Left 04/30/2015   Procedure: CREATION OF LEFT ARM BRACHIO-CEPHALIC ARTERIOVENOUS (AV) FISTULA ;  Surgeon: Angelia Mould, MD;  Location: [place];  Service: Vascular;  Laterality: Left;  . AV Graft PLACEMENT Left 1993?    "attempted one in my wrist; didn't take"  . BELOW KNEE LEG AMPUTATION Left 2010  . CARDIAC CATHETERIZATION  03/19/2014  . CARDIAC CATHETERIZATION  03/19/2014   Procedure: CORONARY BALLOON ANGIOPLASTY;  Surgeon: Burnell Blanks, MD;  Location: [facility] CATH LAB;  Service: Cardiovascular;;  . CARDIAC CATHETERIZATION N/A 01/01/2016   Procedure: Left Heart Cath and Coronary Angiography;  Surgeon: Troy Sine, MD;  Location: [place] CV LAB;  Service: Cardiovascular;  Laterality: N/A;  . CARDIAC CATHETERIZATION N/A 01/01/2016   Procedure: Coronary Stent Intervention;  Surgeon: Troy Sine, MD;  Location: [place] CV LAB;  Service: Cardiovascular;  Laterality: N/A;  . CARPAL TUNNEL RELEASE Bilateral after 2005  . CATARACT EXTRACTION W/ INTRAOCULAR LENS  IMPLANT, BILATERAL Bilateral 1990's  . COLONOSCOPY  08/29/2011   Procedure: COLONOSCOPY;  Surgeon: Jerene Bears, MD;  Location: [place];  Service: Gastroenterology;  Laterality: N/A;  . COLONOSCOPY WITH PROPOFOL N/A 04/26/2016   Procedure: COLONOSCOPY WITH PROPOFOL;  Surgeon: Jerene Bears, MD;  Location: [facility] ENDOSCOPY;  Service: Gastroenterology;  Laterality: N/A;  . CORONARY ANGIOPLASTY WITH STENT PLACEMENT  04/09/2014       ptca/des mid lad   . ESOPHAGOGASTRODUODENOSCOPY N/A 12/25/2015   Procedure: ESOPHAGOGASTRODUODENOSCOPY (EGD);  Surgeon: Mauri Pole, MD;  Location: [facility] ENDOSCOPY;  Service: Endoscopy;  Laterality: N/A;  bedside  . ESOPHAGOGASTRODUODENOSCOPY (EGD) WITH PROPOFOL N/A 04/26/2016   Procedure: ESOPHAGOGASTRODUODENOSCOPY (EGD) WITH PROPOFOL;  Surgeon: Jerene Bears, MD;  Location: [facility] ENDOSCOPY;  Service: Gastroenterology;  Laterality: N/A;  . EYE SURGERY Bilateral    cataract  . FEMORAL ARTERY REPAIR  04/09/2014   ANGIOSEAL  . INSERTION OF DIALYSIS CATHETER  08/01/2011   Procedure: INSERTION OF DIALYSIS CATHETER;  Surgeon: Rosetta Posner, MD;  Location: [place];  Service: Vascular;  Laterality: Right;  insertion of dialysis  catheter on right internal jugular vein  . INSERTION OF DIALYSIS CATHETER Right 01/20/2015   Procedure: INSERTION OF DIALYSIS CATHETER - RIGHT INTERNAL JUGULAR ;  Surgeon: Angelia Mould, MD;  Location: [place];  Service: Vascular;  Laterality: Right;  . KIDNEY TRANSPLANT  04/25/1993  . LEFT HEART CATHETERIZATION WITH CORONARY ANGIOGRAM N/A 03/19/2014   Procedure: LEFT HEART CATHETERIZATION WITH CORONARY ANGIOGRAM;  Surgeon: Burnell Blanks, MD;  Location: [place] CATH LAB;  Service: Cardiovascular;  Laterality: N/A;  . LYMPH NODE BIOPSY  08/10/2011   Procedure: LYMPH NODE BIOPSY;  Surgeon: Merrie Roof, MD;  Location: [place];  Service: General;  Laterality: Left;  left groin lymph biopsy  . NEUROPLASTY / TRANSPOSITION ULNAR NERVE AT ELBOW Left after 2005  . PATCH ANGIOPLASTY Left 08/09/2016   Procedure: LEFT  ARTERIOVENOUS FISTULA PATCH ANGIOPLASTY;  Surgeon: Waynetta Sandy, MD;  Location: [place];  Service: Vascular;  Laterality: Left;  . PERCUTANEOUS CORONARY ROTOBLATOR INTERVENTION (PCI-R) N/A 04/09/2014   Procedure: PERCUTANEOUS CORONARY ROTOBLATOR INTERVENTION (PCI-R);  Surgeon: Burnell Blanks, MD;  Location: [facility] CATH LAB;  Service: Cardiovascular;  Laterality: N/A;  . PERIPHERAL VASCULAR CATHETERIZATION N/A 12/25/2014   Procedure: Upper Extremity Venography;  Surgeon: Conrad [place], MD;  Location: [place] CV LAB;  Service: Cardiovascular;  Laterality: N/A;  . PERIPHERAL VASCULAR CATHETERIZATION Left 02/24/2016   Procedure: Fistulagram;  Surgeon: Waynetta Sandy, MD;  Location: [place] CV LAB;  Service: Cardiovascular;  Laterality: Left;  . PERIPHERAL VASCULAR CATHETERIZATION Left 02/24/2016   Procedure: Peripheral Vascular Intervention;  Surgeon: Waynetta Sandy, MD;  Location: [place] CV LAB;  Service: Cardiovascular;  Laterality: Left;  AV FISTULA  . PERITONEAL CATHETER INSERTION    . PERITONEAL CATHETER REMOVAL    . REVISON OF ARTERIOVENOUS  FISTULA Right 4/00/8676   Procedure: PLICATION / REVISION OF ARTERIOVENOUS FISTULA;  Surgeon: Angelia Mould, MD;  Location: [place];  Service: Vascular;  Laterality: Right;  . REVISON OF ARTERIOVENOUS FISTULA Left 09/01/2015   Procedure: SUPERFICIALIZATION OF LEFT ARM BRACHIOCEPHALIC ARTERIOVENOUS FISTULA;  Surgeon: Angelia Mould, MD;  Location: [place];  Service: Vascular;  Laterality: Left;  . REVISON OF ARTERIOVENOUS FISTULA Left 08/09/2016   Procedure: REVISION OF ARTERIAL ANASTOMOSIS OF ARTERIOVENOUS FISTULA;  Surgeon: Waynetta Sandy, MD;  Location: [place];  Service: Vascular;  Laterality: Left;  . SHOULDER ARTHROSCOPY Left ~ 2006   frozen  . TOE AMPUTATION Bilateral after 2005  . UMBILICAL HERNIA REPAIR  1990's  . VENOGRAM Right 07/28/2011   Procedure: VENOGRAM;  Surgeon: Conrad [place], MD;  Location: [facility] CATH LAB;  Service: Cardiovascular;  Laterality: Right;  . VITRECTOMY Bilateral        History of present illness and  Hospital Course:     Kindly see H&P for history of present illness and admission details, please review complete Labs, Consult reports and Test reports for all details in brief  HPI  from the history and physical done on the day of admission 10/15/2016  HPI: Dale [name] is a 51 y.o. male with medical history significant for insulin-dependent diabetes mellitus, chronic diastolic CHF, CAD with stents, hypothyroidism, chronic pain, and end-stage renal disease on HD, now presenting to the emergency department with lightheadedness, fatigue, and malaise in the setting of persistent hypotension. Patient reports that he has had intermittent lightheadedness and fatigue for the past 1-2 weeks in the setting of low blood pressures. He has been taking Midrin 3 times daily and reports that is dialysis dry weight was recently increased. He was at dialysis today, reportedly completed session, but there was no fluid removal due to low blood pressures. Vision denies  any fevers, chills, chest pain, or headache. He denies melena or hematochezia. He reports some loose stools recently following constipation for which she was taking laxatives. Appetite has been good.  ED Course: Upon arrival to the ED, patient is found to  be afebrile, saturating adequately on room air, mildly tachypneic, and with MAP ranging from 40-60. EKG features a normal sinus rhythm and chest x-ray features low volumes and cardiomegaly. Chemistry panel is largely unremarkable and CBC with stable anemia, hemoglobin low 0.7, but with MCV increased from priors, now 109.1. Lactic acid is elevated to 2.43. Noncontrast head CT is negative for acute intracranial hemorrhage, but notable for a subcentimeter right lentiform nucleus hypodensity, possibly representing a subacute versus chronic lacunar infarct. Patient was treated with 500 mL normal saline x2. MAP remained 40-60. ED physician discussed the case with critical care who indicated that this patient does not need ICU and should be admitted to the stepdown unit by hospitalist. Patient will be admitted for persistent hypotension in a patient with end-stage renal disease and anuria.    Hospital Course   Hypotension  - Pt presents with persistent hypotension; symptomatic with lightheadedness and presyncope  - Has been ongoing problem, treated with midodrine 10 mg TID, recently had dry-weight at HD increased  - He did receive couple 500 mL fluid boluses in ED and the night of admission, since he has not received any further boluses given he is anuric, overall his blood pressure has been  acceptable, no profound hypotension, no lightheadedness or presyncope, no significant events on telemetry, he had negative troponins 3, afebrile, negative blood culture was no evidence of infection. - So this seems to be chronic problem, which has resolved with IV fluids, so maybe his dry weight may to be adjusted further at hemodialysis. - Patient was encouraged to  keep wearing thigh-high TED hose. - Random cortisol level of 4.1, it was done at 2 AM, so it is nonspecific finding, and his hypotension resolved with fluids, if I put tension becomes a recurrent problem, then he may need cosyntropin stimulation test for further evaluation.  ESRD on HD  - Pt completed HD on 10/14/16; no volume was removed due to hypotension  - No emergent indication for HD on admission  - Likely will need his dry weight further adjusted during dialysis  Chronic diastolic CHF - TTE (93/81/01) with EF 55-60%, mild LVH, no WMA's, grade 2 diastolic dysfunction  - Volume management during dialysis - No longer on beta-blocker d/t hypotension   CAD - No anginal complaints  - EKG without acute changes  - Troponins negative 3 - Continue Lipitor, Effient, and ASA 81  Insulin-dependent DM  - A1c 5.7% in November '17  - Continue home regimen on discharge  Hypothyroidism  - TSH wnl - Continue Synthroid   Chronic pain - Attributed to low back degenerative disease  - Stable  - Continue home-regimen as tolerated    Discharge Condition:   Stable  Follow UP  Follow-up Information    Asres, Alehegn, MD Follow up in 1 week(s).   Specialty:  Family Medicine Contact information: [address] [place] [address] [phone]             Discharge Instructions  and  Discharge Medications     Discharge Instructions    Discharge instructions    Complete by:  As directed    Follow with Primary MD [name], MD in 7 days   Get CBC, CMP, checked  by Primary MD next visit.    Activity: As tolerated with Full fall precautions use walker/cane & assistance as needed   Disposition Home   Diet: Renal notified with 1200 mL fluid restrictions   On your next visit with  your primary care physician please Get Medicines reviewed and adjusted.   Please request your Prim.MD to go over all Hospital Tests and Procedure/Radiological  results at the follow up, please get all Hospital records sent to your Prim MD by signing hospital release before you go home.   If you experience worsening of your admission symptoms, develop shortness of breath, life threatening emergency, suicidal or homicidal thoughts you must seek medical attention immediately by calling 911 or calling your MD immediately  if symptoms less severe.  You Must read complete instructions/literature along with all the possible adverse reactions/side effects for all the Medicines you take and that have been prescribed to you. Take any new Medicines after you have completely understood and accpet all the possible adverse reactions/side effects.   Do not drive, operating heavy machinery, perform activities at heights, swimming or participation in water activities or provide baby sitting services if your were admitted for syncope or siezures until you have seen by Primary MD or a Neurologist and advised to do so again.  Do not drive when taking Pain medications.    Do not take more than prescribed Pain, Sleep and Anxiety Medications  Special Instructions: If you have smoked or chewed Tobacco  in the last 2 yrs please stop smoking, stop any regular Alcohol  and or any Recreational drug use.  Wear Seat belts while driving.   Please note  You were cared for by a hospitalist during your hospital stay. If you have any questions about your discharge medications or the care you received while you were in the hospital after you are discharged, you can call the unit and asked to speak with the hospitalist on call if the hospitalist that took care of you is not available. Once you are discharged, your primary care physician will handle any further medical issues. Please note that NO REFILLS for any discharge medications will be authorized once you are discharged, as it is imperative that you return to your primary care physician (or establish a relationship with a primary  care physician if you do not have one) for your aftercare needs so that they can reassess your need for medications and monitor your lab values.   Increase activity slowly    Complete by:  As directed      Allergies as of 10/16/2016      Reactions   Prednisone Other (See Comments)   Increases sugar levels   Adhesive [tape] Itching, Other (See Comments)   Blisters on arm from adhesive tape at dialysis   Amoxicillin Itching, Rash   Has patient had a PCN reaction causing immediate rash, facial/tongue/throat swelling, SOB or lightheadedness with hypotension: Yes Has patient had a PCN reaction causing severe rash involving mucus membranes or skin necrosis: No Has patient had a PCN reaction that required hospitalization No Has patient had a PCN reaction occurring within the last 10 years: No If all of the above answers are "NO", then may proceed with Cephalosporin use.   Clindamycin/lincomycin Hives, Itching   Contrast Media [iodinated Diagnostic Agents] Itching, Rash   Systemic itching and transient rash appearance within hours of receiving contrast    Enalapril Rash, Cough   Mobic [meloxicam] Hives, Rash   Morphine Hives, Nausea Only, Rash   Brilinta [ticagrelor] Nausea And Vomiting   Ciprofloxacin Itching, Rash   Clopidogrel Rash   Codeine Hives, Itching, Rash   Doxycycline Rash   Hydrocodone Rash   Levofloxacin Hives, Itching   Oxycodone Rash   Rocephin [  ceftriaxone] Itching      Medication List    TAKE these medications   acetaminophen 500 MG tablet Commonly known as:  TYLENOL Take 1,000 mg by mouth every 4 (four) hours as needed for moderate pain or headache.   albuterol 108 (90 Base) MCG/ACT inhaler Commonly known as:  PROVENTIL HFA;VENTOLIN HFA Inhale 2 puffs into the lungs every 6 (six) hours as needed for wheezing or shortness of breath.   allopurinol 100 MG tablet Commonly known as:  ZYLOPRIM Take 200 mg by mouth daily.   aspirin EC 81 MG tablet Take 81 mg by  mouth at bedtime.   atorvastatin 80 MG tablet Commonly known as:  LIPITOR Take 1 tablet (80 mg total) by mouth daily at 6 PM.   calcium acetate 667 MG capsule Commonly known as:  PHOSLO Take 667-1,334 mg by mouth 3 (three) times daily with meals. Take 1336ms with meals and 6653m with snacks   docusate sodium 100 MG capsule Commonly known as:  COLACE Take 200 mg by mouth 2 (two) times daily.   EFFIENT 10 MG Tabs tablet Generic drug:  prasugrel Take 10 mg by mouth daily.   feeding supplement (NEPRO CARB STEADY) Liqd Take 237 mLs by mouth daily.   fenofibrate 48 MG tablet Commonly known as:  TRICOR Take 48 mg by mouth daily.   fluticasone 50 MCG/ACT nasal spray Commonly known as:  FLONASE Place 2 sprays into both nostrils daily as needed for allergies.   FOSRENOL 1000 MG Pack Generic drug:  Lanthanum Carbonate Take 1 Package by mouth 3 (three) times daily.   gabapentin 300 MG capsule Commonly known as:  NEURONTIN Take 300 mg by mouth 3 (three) times daily.   GLUCAGON EMERGENCY 1 MG injection Generic drug:  glucagon Inject 1 mg into the muscle daily as needed (low blood sugar).   HYDROmorphone 2 MG tablet Commonly known as:  DILAUDID Take 2 mg by mouth 3 (three) times daily as needed for moderate pain or severe pain.   hydrOXYzine 10 MG tablet Commonly known as:  ATARAX/VISTARIL Take 10 mg by mouth every 8 (eight) hours as needed for anxiety or vomiting.   insulin glargine 100 UNIT/ML injection Commonly known as:  LANTUS Inject 36 Units into the skin at bedtime.   insulin lispro 100 UNIT/ML injection Commonly known as:  HUMALOG Inject 1-20 Units into the skin 3 (three) times daily. Blood glucose - 120, 1 unit for every 20 points over 120   levothyroxine 112 MCG tablet Commonly known as:  SYNTHROID, LEVOTHROID Take 112 mcg by mouth every evening. 2 hours after dinner   loratadine 10 MG tablet Commonly known as:  CLARITIN Take 10 mg by mouth daily.     metoCLOPramide 10 MG tablet Commonly known as:  REGLAN Take 10 mg by mouth 4 (four) times daily -  with meals and at bedtime.   midodrine 10 MG tablet Commonly known as:  PROAMATINE Take 1 tablet (10 mg total) by mouth 3 (three) times daily. Takes 1 tab in mornings of dialysis and takes 2 tabs 30 mins before starting dialysis (m,w,f)   multivitamin Tabs tablet Take 1 tablet by mouth daily.   mupirocin ointment 2 % Commonly known as:  BACTROBAN Place 1 application into the nose 2 (two) times daily.   nitroGLYCERIN 0.4 MG SL tablet Commonly known as:  NITROSTAT Place 1 tablet (0.4 mg total) under the tongue every 5 (five) minutes as needed for chest pain.   omeprazole 20 MG  tablet Commonly known as:  PRILOSEC OTC Take 20 mg by mouth daily.   OXYGEN Inhale 3 L into the lungs at bedtime.   polyethylene glycol packet Commonly known as:  MIRALAX / GLYCOLAX Take 17 g by mouth daily. What changed:  when to take this  reasons to take this   promethazine 25 MG tablet Commonly known as:  PHENERGAN Take 1 tablet (25 mg total) by mouth every 6 (six) hours as needed for nausea or vomiting. For nausea   rOPINIRole 2 MG tablet Commonly known as:  REQUIP Take 2 mg by mouth every evening.   sodium chloride 5 % ophthalmic ointment Commonly known as:  MURO 485 Place 1 application into both eyes at bedtime.   SYSTANE 0.4-0.3 % Soln Generic drug:  Polyethyl Glycol-Propyl Glycol Place 1 drop into both eyes as needed (dry eyes).   testosterone cypionate 200 MG/ML injection Commonly known as:  DEPOTESTOSTERONE CYPIONATE INJECT 1 ML INTO THE MUSCLE Q 14 DAYS   WOUND [place] Liqd Apply 1 application topically daily as needed (wound care).         Diet and Activity recommendation: See Discharge Instructions above   Consults obtained -  None   Major procedures and Radiology Reports - PLEASE review detailed and final reports for all details, in brief -    Ct Head Wo  Contrast  Result Date: 10/15/2016 CLINICAL DATA:  51 year old male with frontal headache and dizziness. EXAM: CT HEAD WITHOUT CONTRAST TECHNIQUE: Contiguous axial images were obtained from the base of the skull through the vertex without intravenous contrast. COMPARISON:  None. FINDINGS: Brain: The ventricles and sulci appropriate size for the patient's age. Minimal periventricular and deep white matter chronic microvascular ischemic changes noted. A subcentimeter hypodense focus in the right lentiform nucleus is age indeterminate but may represent a subacute or chronic lacunar infarct. Acute infarct is less likely but not entirely excluded. If there is high clinical concern for acute infarct MRI is recommended for further characterization. There is no acute intracranial hemorrhage. No mass effect or midline shift noted. No extra-axial fluid collection. Vascular: No hyperdense vessel or unexpected calcification. There is calcification of the vertebral arteries at the foramen magnum. Skull:  The calvarium is intact. Sinuses/Orbits: There is mild diffuse mucoperiosteal thickening of paranasal sinuses. There is opacification of multiple mastoid air cells bilaterally. No air-fluid levels. Other: None IMPRESSION: 1. No acute intracranial hemorrhage. 2. Minimal chronic microvascular ischemic changes. Subcentimeter right lentiform nucleus hypodensity likely represents a subacute or chronic lacunar infarct. If there is high clinical concern for acute ischemia, MRI is recommended for further evaluation. 3. Chronic appearing paranasal sinus disease. Electronically Signed   By: Anner Crete M.D.   On: 10/15/2016 00:51   Dg Chest Portable 1 View  Result Date: 10/14/2016 CLINICAL DATA:  Hypotension tonight.  Cough for 3 weeks. EXAM: PORTABLE CHEST 1 VIEW COMPARISON:  Radiograph 04/13/2016.  Chest CT 01/28/2016 FINDINGS: Persistent low lung volumes. Unchanged cardiomegaly and mediastinal contours. No evidence pulmonary  edema. Streaky bibasilar atelectasis. Vascular stent in left subclavian. No consolidation, large pleural effusion, or pneumothorax. No acute osseous abnormalities are seen. IMPRESSION: Low lung volumes with persistent cardiomegaly. Streaky bibasilar atelectasis. Electronically Signed   By: Jeb Levering M.D.   On: 10/14/2016 23:31    Micro Results    Recent Results (from the past 240 hour(s))  MRSA PCR Screening     Status: Abnormal   Collection Time: 10/15/16  8:33 AM  Result Value Ref Range Status  MRSA by PCR POSITIVE (A) NEGATIVE Final    Comment:        The GeneXpert MRSA Assay (FDA approved for NASAL specimens only), is one component of a comprehensive MRSA colonization surveillance program. It is not intended to diagnose MRSA infection nor to guide or monitor treatment for MRSA infections. RESULT CALLED TO, READ BACK BY AND VERIFIED WITH: C. KOLOMBO [id number] 06.23.2018 N. MORRIS        Today   Subjective:   Dale [name] today has no headache,no chest or abdominal pain,no new weakness tingling or numbness, feels much better  today.   Objective:   Blood pressure (!) 103/51, pulse 85, temperature 98.1 F (36.7 C), temperature source Oral, resp. rate 20, height _0  (1.727 m), weight (!) 136.2 kg (300 lb 4.8 oz), SpO2 (!) 85 %.   Intake/Output Summary (Last 24 hours) at 10/16/16 1043 Last data filed at 10/16/16 0700  Gross per 24 hour  Intake              582 ml  Output                0 ml  Net              582 ml    Exam Awake Alert, Oriented x 3, No new F.N deficits, Normal affect Symmetrical Chest wall movement, Good air movement bilaterally, CTAB RRR,No Gallops,Rubs or new Murmurs, No Parasternal Heave +ve B.Sounds, Abd Soft, Non tender, No rebound -guarding or rigidity. No Cyanosis, Clubbing or edema,Left BKA  Data Review   CBC w Diff: Lab Results  Component Value Date   WBC 6.0 10/15/2016   HGB 11.5 (L) 10/15/2016   HCT 37.9 (L) 10/15/2016    PLT 241 10/15/2016   LYMPHOPCT 29 10/15/2016   MONOPCT 4 10/15/2016   EOSPCT 3 10/15/2016   BASOPCT 0 10/15/2016    CMP: Lab Results  Component Value Date   NA 137 10/15/2016   K 4.0 10/15/2016   CL 98 (L) 10/15/2016   CO2 28 10/15/2016   BUN 27 (H) 10/15/2016   CREATININE 6.76 (H) 10/15/2016   PROT 7.5 10/14/2016   ALBUMIN 3.6 10/14/2016   BILITOT 0.4 10/14/2016   ALKPHOS 96 10/14/2016   AST 36 10/14/2016   ALT 30 10/14/2016  .   Total Time in preparing paper work, data evaluation and todays exam - 35 minutes  Dale [name] M.D on 10/16/2016 at 10:44 AM  [facility]   Office  [phone]

## 2016-10-16 NOTE — Care Management Note (Signed)
Case Management Note  Patient Details  Name: Dale [name] MRN: [id number] Date of Birth: 1966/04/06  Subjective/Objective:    Pt admitted post outpt HD for persistent hypotention                Action/Plan:   PTA independent from home.  Pt is on outpt HD.  Pt states he was on effient and home oxygen PTA.  Pt informed CM that he only uses oxygen at night and therefore portable tank is not necessary for transport home.  Pt stated he sent back [facility] equipment after he purchased his own concentrator - pt confirmed oxygen equipment works in the home.  NO CM needs determined prior to discharge   Expected Discharge Date:  10/16/16               Expected Discharge Plan:  Home/Self Care  In-House Referral:     Discharge planning Services  CM Consult  Post Acute Care Choice:    Choice offered to:     DME Arranged:    DME Agency:     HH Arranged:    [place] Agency:     Status of Service:  Completed, signed off  If discussed at [employer] of Stay Meetings, dates discussed:    Additional Comments:  Maryclare Labrador, RN 10/16/2016, 11:06 AM

## 2016-10-16 NOTE — Discharge Instructions (Signed)
Follow with Primary MD [name], MD in 7 days   Get CBC, CMP, checked  by Primary MD next visit.    Activity: As tolerated with Full fall precautions use walker/cane & assistance as needed   Disposition Home   Diet: Renal notified with 1200 mL fluid restrictions   On your next visit with your primary care physician please Get Medicines reviewed and adjusted.   Please request your Prim.MD to go over all Hospital Tests and Procedure/Radiological results at the follow up, please get all Hospital records sent to your Prim MD by signing hospital release before you go home.   If you experience worsening of your admission symptoms, develop shortness of breath, life threatening emergency, suicidal or homicidal thoughts you must seek medical attention immediately by calling 911 or calling your MD immediately  if symptoms less severe.  You Must read complete instructions/literature along with all the possible adverse reactions/side effects for all the Medicines you take and that have been prescribed to you. Take any new Medicines after you have completely understood and accpet all the possible adverse reactions/side effects.   Do not drive, operating heavy machinery, perform activities at heights, swimming or participation in water activities or provide baby sitting services if your were admitted for syncope or siezures until you have seen by Primary MD or a Neurologist and advised to do so again.  Do not drive when taking Pain medications.    Do not take more than prescribed Pain, Sleep and Anxiety Medications  Special Instructions: If you have smoked or chewed Tobacco  in the last 2 yrs please stop smoking, stop any regular Alcohol  and or any Recreational drug use.  Wear Seat belts while driving.   Please note  You were cared for by a hospitalist during your hospital stay. If you have any questions about your discharge medications or the care you received while you were in the  hospital after you are discharged, you can call the unit and asked to speak with the hospitalist on call if the hospitalist that took care of you is not available. Once you are discharged, your primary care physician will handle any further medical issues. Please note that NO REFILLS for any discharge medications will be authorized once you are discharged, as it is imperative that you return to your primary care physician (or establish a relationship with a primary care physician if you do not have one) for your aftercare needs so that they can reassess your need for medications and monitor your lab values.

## 2016-10-16 NOTE — Plan of Care (Signed)
Problem: Bowel/Gastric: Goal: Will not experience complications related to bowel motility Pt is taking stool softeners daily. Had a BM yesterday.

## 2016-10-17 LAB — FOLATE RBC
Folate, Hemolysate: >620 ng/mL
Folate, RBC: >1717 ng/mL (ref >498)
Hematocrit: 36.1 % — ABNORMAL LOW (ref 37.5–51.0)

## 2016-10-20 ENCOUNTER — Telehealth: Payer: Self-pay | Admitting: Cardiovascular Disease

## 2016-10-20 ENCOUNTER — Encounter: Payer: Self-pay | Admitting: *Deleted

## 2016-10-20 DIAGNOSIS — I5032 Chronic diastolic (congestive) heart failure: Secondary | ICD-10-CM

## 2016-10-20 LAB — CULTURE, BLOOD (ROUTINE X 2)
Culture: NO GROWTH
Culture: NO GROWTH
Special Requests: ADEQUATE
Special Requests: ADEQUATE

## 2016-10-20 NOTE — Telephone Encounter (Signed)
We can arrange an echo for him. We can put diastolic CHF down as the reason for the study. Thanks, chris

## 2016-10-20 NOTE — Telephone Encounter (Signed)
New message     Pt would like Dr [name] to order an echo for him, he could not have one done at the hospital when he was admitted.

## 2016-10-20 NOTE — Telephone Encounter (Signed)
I spoke with pt and scheduled echo for 11/01/16 at 7:30

## 2016-10-20 NOTE — Telephone Encounter (Signed)
Chart reviewed. Pt was in hospital from 6/22-6/24 with hypotension.  Per H&P on 6/23 TTE was planned.  No results in chart. Will forward to Dr. [name] to see if echo indicated at this time.

## 2016-10-21 ENCOUNTER — Ambulatory Visit: Payer: Medicare Other | Admitting: Internal Medicine

## 2016-10-22 DIAGNOSIS — Z992 Dependence on renal dialysis: Secondary | ICD-10-CM | POA: Diagnosis not present

## 2016-10-22 DIAGNOSIS — N186 End stage renal disease: Secondary | ICD-10-CM | POA: Diagnosis not present

## 2016-10-22 DIAGNOSIS — T8612 Kidney transplant failure: Secondary | ICD-10-CM | POA: Diagnosis not present

## 2016-10-31 DIAGNOSIS — Z794 Long term (current) use of insulin: Secondary | ICD-10-CM | POA: Diagnosis not present

## 2016-10-31 DIAGNOSIS — E1059 Type 1 diabetes mellitus with other circulatory complications: Secondary | ICD-10-CM | POA: Diagnosis not present

## 2016-10-31 DIAGNOSIS — E1040 Type 1 diabetes mellitus with diabetic neuropathy, unspecified: Secondary | ICD-10-CM | POA: Diagnosis not present

## 2016-10-31 DIAGNOSIS — E108 Type 1 diabetes mellitus with unspecified complications: Secondary | ICD-10-CM | POA: Diagnosis not present

## 2016-11-01 ENCOUNTER — Ambulatory Visit (HOSPITAL_COMMUNITY): Payer: Medicare Other | Attending: Internal Medicine

## 2016-11-01 ENCOUNTER — Other Ambulatory Visit: Payer: Self-pay

## 2016-11-01 DIAGNOSIS — E1122 Type 2 diabetes mellitus with diabetic chronic kidney disease: Secondary | ICD-10-CM | POA: Diagnosis not present

## 2016-11-01 DIAGNOSIS — N186 End stage renal disease: Secondary | ICD-10-CM | POA: Insufficient documentation

## 2016-11-01 DIAGNOSIS — I081 Rheumatic disorders of both mitral and tricuspid valves: Secondary | ICD-10-CM | POA: Insufficient documentation

## 2016-11-01 DIAGNOSIS — I2510 Atherosclerotic heart disease of native coronary artery without angina pectoris: Secondary | ICD-10-CM | POA: Diagnosis not present

## 2016-11-01 DIAGNOSIS — G4733 Obstructive sleep apnea (adult) (pediatric): Secondary | ICD-10-CM | POA: Diagnosis not present

## 2016-11-01 DIAGNOSIS — I739 Peripheral vascular disease, unspecified: Secondary | ICD-10-CM | POA: Insufficient documentation

## 2016-11-01 DIAGNOSIS — I5032 Chronic diastolic (congestive) heart failure: Secondary | ICD-10-CM | POA: Insufficient documentation

## 2016-11-01 DIAGNOSIS — I132 Hypertensive heart and chronic kidney disease with heart failure and with stage 5 chronic kidney disease, or end stage renal disease: Secondary | ICD-10-CM | POA: Insufficient documentation

## 2016-11-01 DIAGNOSIS — E039 Hypothyroidism, unspecified: Secondary | ICD-10-CM | POA: Insufficient documentation

## 2016-11-01 DIAGNOSIS — E785 Hyperlipidemia, unspecified: Secondary | ICD-10-CM | POA: Insufficient documentation

## 2016-11-01 MED ORDER — PERFLUTREN LIPID MICROSPHERE
1.0000 mL | INTRAVENOUS | Status: AC | PRN
  Administered 2016-11-01: 2 mL via INTRAVENOUS

## 2016-11-09 DIAGNOSIS — I83019 Varicose veins of right lower extremity with ulcer of unspecified site: Secondary | ICD-10-CM | POA: Diagnosis not present

## 2016-11-09 DIAGNOSIS — L97919 Non-pressure chronic ulcer of unspecified part of right lower leg with unspecified severity: Secondary | ICD-10-CM | POA: Diagnosis not present

## 2016-11-10 NOTE — Addendum Note (Signed)
Addendum  created 11/10/16 1743 by Annye Asa, MD   Sign clinical note

## 2016-11-22 DIAGNOSIS — Z992 Dependence on renal dialysis: Secondary | ICD-10-CM | POA: Diagnosis not present

## 2016-11-22 DIAGNOSIS — T8612 Kidney transplant failure: Secondary | ICD-10-CM | POA: Diagnosis not present

## 2016-11-22 DIAGNOSIS — N186 End stage renal disease: Secondary | ICD-10-CM | POA: Diagnosis not present

## 2016-11-23 DIAGNOSIS — D631 Anemia in chronic kidney disease: Secondary | ICD-10-CM | POA: Diagnosis not present

## 2016-11-23 DIAGNOSIS — N186 End stage renal disease: Secondary | ICD-10-CM | POA: Diagnosis not present

## 2016-11-23 DIAGNOSIS — D509 Iron deficiency anemia, unspecified: Secondary | ICD-10-CM | POA: Diagnosis not present

## 2016-11-23 DIAGNOSIS — T82818A Embolism of vascular prosthetic devices, implants and grafts, initial encounter: Secondary | ICD-10-CM | POA: Diagnosis not present

## 2016-11-23 DIAGNOSIS — N2581 Secondary hyperparathyroidism of renal origin: Secondary | ICD-10-CM | POA: Diagnosis not present

## 2016-11-23 DIAGNOSIS — E1022 Type 1 diabetes mellitus with diabetic chronic kidney disease: Secondary | ICD-10-CM | POA: Diagnosis not present

## 2016-11-25 DIAGNOSIS — D509 Iron deficiency anemia, unspecified: Secondary | ICD-10-CM | POA: Diagnosis not present

## 2016-11-25 DIAGNOSIS — E1022 Type 1 diabetes mellitus with diabetic chronic kidney disease: Secondary | ICD-10-CM | POA: Diagnosis not present

## 2016-11-25 DIAGNOSIS — N186 End stage renal disease: Secondary | ICD-10-CM | POA: Diagnosis not present

## 2016-11-25 DIAGNOSIS — D631 Anemia in chronic kidney disease: Secondary | ICD-10-CM | POA: Diagnosis not present

## 2016-11-25 DIAGNOSIS — N2581 Secondary hyperparathyroidism of renal origin: Secondary | ICD-10-CM | POA: Diagnosis not present

## 2016-11-25 DIAGNOSIS — T82818A Embolism of vascular prosthetic devices, implants and grafts, initial encounter: Secondary | ICD-10-CM | POA: Diagnosis not present

## 2016-11-28 DIAGNOSIS — D509 Iron deficiency anemia, unspecified: Secondary | ICD-10-CM | POA: Diagnosis not present

## 2016-11-28 DIAGNOSIS — E1022 Type 1 diabetes mellitus with diabetic chronic kidney disease: Secondary | ICD-10-CM | POA: Diagnosis not present

## 2016-11-28 DIAGNOSIS — N2581 Secondary hyperparathyroidism of renal origin: Secondary | ICD-10-CM | POA: Diagnosis not present

## 2016-11-28 DIAGNOSIS — N186 End stage renal disease: Secondary | ICD-10-CM | POA: Diagnosis not present

## 2016-11-28 DIAGNOSIS — T82818A Embolism of vascular prosthetic devices, implants and grafts, initial encounter: Secondary | ICD-10-CM | POA: Diagnosis not present

## 2016-11-28 DIAGNOSIS — D631 Anemia in chronic kidney disease: Secondary | ICD-10-CM | POA: Diagnosis not present

## 2016-11-29 ENCOUNTER — Encounter: Payer: Self-pay | Admitting: Student

## 2016-11-29 ENCOUNTER — Ambulatory Visit (INDEPENDENT_AMBULATORY_CARE_PROVIDER_SITE_OTHER): Payer: Medicare Other | Admitting: Student

## 2016-11-29 VITALS — BP 124/68 | HR 79 | Temp 98.3°F | Ht 68.0 in | Wt 292.0 lb

## 2016-11-29 DIAGNOSIS — Z23 Encounter for immunization: Secondary | ICD-10-CM

## 2016-11-29 DIAGNOSIS — I5032 Chronic diastolic (congestive) heart failure: Secondary | ICD-10-CM

## 2016-11-29 DIAGNOSIS — Z794 Long term (current) use of insulin: Secondary | ICD-10-CM | POA: Diagnosis not present

## 2016-11-29 DIAGNOSIS — N189 Chronic kidney disease, unspecified: Secondary | ICD-10-CM

## 2016-11-29 DIAGNOSIS — I10 Essential (primary) hypertension: Secondary | ICD-10-CM | POA: Diagnosis not present

## 2016-11-29 DIAGNOSIS — Z992 Dependence on renal dialysis: Secondary | ICD-10-CM

## 2016-11-29 DIAGNOSIS — D631 Anemia in chronic kidney disease: Secondary | ICD-10-CM

## 2016-11-29 DIAGNOSIS — IMO0001 Reserved for inherently not codable concepts without codable children: Secondary | ICD-10-CM

## 2016-11-29 DIAGNOSIS — N186 End stage renal disease: Secondary | ICD-10-CM

## 2016-11-29 DIAGNOSIS — Z0000 Encounter for general adult medical examination without abnormal findings: Secondary | ICD-10-CM | POA: Diagnosis not present

## 2016-11-29 DIAGNOSIS — E119 Type 2 diabetes mellitus without complications: Secondary | ICD-10-CM | POA: Diagnosis not present

## 2016-11-29 DIAGNOSIS — D638 Anemia in other chronic diseases classified elsewhere: Secondary | ICD-10-CM | POA: Diagnosis not present

## 2016-11-29 MED ORDER — ZOSTER VAC RECOMB ADJUVANTED 50 MCG/0.5ML IM SUSR: 0.5000 mL | Freq: Once | INTRAMUSCULAR | 1 refills | Status: AC

## 2016-11-29 NOTE — Assessment & Plan Note (Addendum)
Udated his medication history and medication list. Discussed about diet, including portion size. Obviously, patient is on renal diet due to ESRD. Encouraged regular dental visit. Gave Rx for Shingrix. Discussed about side effects and need for second part in 2-6 months Otherwise, uptodate.

## 2016-11-29 NOTE — Progress Notes (Signed)
Subjective:   CC: establish care:  HPI: Dale [name] is a 51 y.o. old male here to establish care. He has no concern or need today. He was followed at [facility] in the past. Patient is on disabilty for his medical conditions. He is wheelchair bound. The patient wears seatbelts: yes.  The patient has regular exercise: hand weight and peddlers for his leg. He is wheelchair bound.  Patient reports eating enough vegetables and fruits: occasionally. Patient takes ASA: yes.The patient has had transfusions in the past, with the first being in 1991. The patient is not sexually active. The patient denies domestic violence. Patient has advance directive: yes. Patient has MOST: yes.  Patient has depression:no. Patient dental home: yes.  Immunizations  Likes to have influenza vaccine: not applicable.  Needs HPV (Women until age 66): not applicable.  Needs Shingrix (all >45yr of age): yes. Gave Rx and discussed about side effects and need for second shot 2-6 months later  Needs Tdap: no. He says he had one recently  Needs Pneumococcal: uptodate  Screening Need colon cancer screening: uptodate Need breast cancer ccreening: not applicable. Need cervical cancer Screening: not applicable. STOP BANG >/=3 for OSA: has history of OSA but couldn't tolerate CPAP due claustrophobia.  Need lung cancer screening: never smoked At risk for skin cancer: no. Need HCV Screening: no Need STI Screening: no.  HPI  PMH/Problem List: has History of renal transplant; S/P BKA (below knee amputation) ([place]; OSA (obstructive sleep apnea); Pericardial effusion; HTN (hypertension); NSTEMI (non-ST elevated myocardial infarction) ([place]; Coronary artery disease involving native coronary artery of native heart with unstable angina pectoris ([place]; Allergic reaction to dye; [place]D on dialysis ([place]; HLD (hyperlipidemia); Pulmonary edema; Chronic pain; Gastrointestinal hemorrhage with melena; Iron deficiency anemia; Anemia of chronic  disease; Tracheobronchomalacia; Chronic diastolic heart failure ([place]; IDDM with complications; Oxygen dependent; Visit for preventive health examination; Heme positive stool; and Hypotension (arterial) on his problem list.   has a past medical history of Anal fissure; Anemia of chronic disease; Arthritis; CAD (coronary artery disease); Cholecystitis; Chronic diastolic CHF (congestive heart failure) ([place]; Claustrophobia; Complication of anesthesia; Difficult intubation; Diverticulosis; ESRD (end stage renal disease) ([place]; Fibromyalgia; Gastroparesis; GERD (gastroesophageal reflux disease); Gout; History of blood transfusion; HLD (hyperlipidemia); Hypertension; Hypothyroidism; Inguinal lymphadenopathy; Insulin dependent diabetes mellitus ([place]; Monilial esophagitis ([place]; Morbid obesity ([place]; OSA (obstructive sleep apnea); PAD (peripheral artery disease) ([place]; Pericardial effusion; Pneumonia (~ 2007); PONV (postoperative nausea and vomiting); and Tracheobronchomalacia.  [facility]  Problem Relation Age of Onset  . Colon polyps Father   . Diabetes Father   . Hypertension Father   . Hypertension Mother   . Diabetes Mother   . Hyperlipidemia Mother   . Breast cancer Maternal Aunt   . Bone cancer Maternal Aunt   . Liver cancer Maternal Aunt   . Malignant hyperthermia Neg Hx   . Heart attack Neg Hx   . Stroke Neg Hx   . Colon cancer Neg Hx   . Stomach cancer Neg Hx   . Pancreatic cancer Neg Hx   . Esophageal cancer Neg Hx    Family history of heart disease before age of 6105yrs: no. Family history of stroke: no. Family history of cancer: no.  [place]  Substance Use Topics  . Smoking status: Never Smoker  . Smokeless tobacco: Never Used  . Alcohol use Yes     Comment: occasional     Review of Systems  Constitutional: Negative for diaphoresis, fatigue, fever and  unexpected weight change.  HENT: Positive for trouble swallowing. Negative for congestion, hearing  loss and voice change.        Since intubation. With certain food.  Eyes: Negative for visual disturbance.  Respiratory: Negative for cough, chest tightness and shortness of breath.   Cardiovascular: Negative for chest pain, palpitations and leg swelling.  Gastrointestinal: Positive for constipation, diarrhea and nausea. Negative for abdominal pain and blood in stool.  Endocrine: Negative for polydipsia and polyphagia.  Genitourinary: Positive for decreased urine volume. Negative for scrotal swelling and testicular pain.  Musculoskeletal: Positive for arthralgias. Negative for myalgias.       BKA in left  Skin: Negative for rash.  Neurological: Positive for dizziness and numbness. Negative for light-headedness and headaches.  Hematological: Negative for adenopathy. Does not bruise/bleed easily.  Psychiatric/Behavioral: Negative for dysphoric mood. The patient is not nervous/anxious.        Objective:   Physical Exam Vitals:   11/29/16 1409  BP: 124/68  Pulse: 79  Temp: 98.3 F (36.8 C)  TempSrc: Oral  SpO2: 93%  Weight: 292 lb (132.5 kg)  Height: _0  (1.727 m)   Body mass index is 44.4 kg/m.  GEN: comfortably sitting in wheel chair, no apparent distress. Head: normocephalic and atraumatic  Eyes: conjunctiva without injection, sclera anicteric Ears: external ear and ear canal normal Oropharynx: mmm without erythema or exudation HEM: negative for cervical or periauricular lymphadenopathies CVS: RRR, nl s1 & s2,  2/6 SEM, AVF Access on his left arm with bruits RESP: no IWOB, good air movement bilaterally, CTAB GI: obese, BS present & normal, soft, NTND GU: not examined MSK: BKA on right, venous insufficiency in left. Wears compression stocking SKIN: no apparent skin lesion ENDO: negative thyromegally  NEURO: alert and oiented appropriately, no gross deficits  PSYCH: euthymic mood with congruent affect    Assessment & Plan:  Visit for preventive health  examination Udated his medication history and medication list. Discussed about diet, including portion size. Obviously, patient is on renal diet due to ESRD. Encouraged regular dental visit. Gave Rx for Shingrix. Discussed about side effects and need for second part in 2-6 months Otherwise, uptodate.  HTN (hypertension) Normotensive. Will check CMP today  Anemia of chronic disease CBC with differential. He endorses dizziness which is not unusual in ESRD patients  Orders Placed This Encounter  Procedures  . CMP14+EGFR  . CBC with Differential/Platelet    Wendee Beavers PGY-3 Pager [phone] 11/29/16  9:43 PM

## 2016-11-29 NOTE — Assessment & Plan Note (Signed)
Normotensive. Will check CMP today

## 2016-11-29 NOTE — Assessment & Plan Note (Signed)
CBC with differential. He endorses dizziness which is not unusual in ESRD patients

## 2016-11-29 NOTE — Patient Instructions (Signed)
Portion Size    Choose healthier foods such as 100% whole grains, vegetables, fruits, beans, nut seeds, olive oil, most vegetable oils, fat-free dietary, wild game and fish.   Avoid sweet tea, other sweetened beverages, soda, fruit juice, cold cereal and milk and trans fat.   Eat at least 3 meals and 1-2 snacks per day.  Aim for no more than 5 hours between eating.  Eat breakfast within one hour of getting up.    Exercise at least 150 minutes per week, including weight resistance exercises 3 or 4 times per week.   Try to lose at least 7-10% of your current body weight.

## 2016-11-30 DIAGNOSIS — D631 Anemia in chronic kidney disease: Secondary | ICD-10-CM | POA: Diagnosis not present

## 2016-11-30 DIAGNOSIS — E1022 Type 1 diabetes mellitus with diabetic chronic kidney disease: Secondary | ICD-10-CM | POA: Diagnosis not present

## 2016-11-30 DIAGNOSIS — N186 End stage renal disease: Secondary | ICD-10-CM | POA: Diagnosis not present

## 2016-11-30 DIAGNOSIS — N2581 Secondary hyperparathyroidism of renal origin: Secondary | ICD-10-CM | POA: Diagnosis not present

## 2016-11-30 DIAGNOSIS — T82818A Embolism of vascular prosthetic devices, implants and grafts, initial encounter: Secondary | ICD-10-CM | POA: Diagnosis not present

## 2016-11-30 DIAGNOSIS — D509 Iron deficiency anemia, unspecified: Secondary | ICD-10-CM | POA: Diagnosis not present

## 2016-11-30 LAB — CBC WITH DIFFERENTIAL/PLATELET
Basophils Absolute: 0 10*3/uL (ref 0.0–0.2)
Basos: 0 %
EOS (ABSOLUTE): 0.3 10*3/uL (ref 0.0–0.4)
Eos: 4 %
Hematocrit: 39.4 % (ref 37.5–51.0)
Hemoglobin: 12.6 g/dL — ABNORMAL LOW (ref 13.0–17.7)
Immature Grans (Abs): 0 10*3/uL (ref 0.0–0.1)
Immature Granulocytes: 0 %
Lymphocytes Absolute: 1.6 10*3/uL (ref 0.7–3.1)
Lymphs: 24 %
MCH: 34.4 pg — ABNORMAL HIGH (ref 26.6–33.0)
MCHC: 32 g/dL (ref 31.5–35.7)
MCV: 108 fL — ABNORMAL HIGH (ref 79–97)
Monocytes Absolute: 0.5 10*3/uL (ref 0.1–0.9)
Monocytes: 8 %
Neutrophils Absolute: 4.3 10*3/uL (ref 1.4–7.0)
Neutrophils: 64 %
Platelets: 278 10*3/uL (ref 150–379)
RBC: 3.66 x10E6/uL — ABNORMAL LOW (ref 4.14–5.80)
RDW: 15.2 % (ref 12.3–15.4)
WBC: 6.7 10*3/uL (ref 3.4–10.8)

## 2016-11-30 LAB — CMP14+EGFR
ALT: 12 IU/L (ref 0–44)
AST: 16 IU/L (ref 0–40)
Albumin/Globulin Ratio: 1.4 (ref 1.2–2.2)
Albumin: 4 g/dL (ref 3.5–5.5)
Alkaline Phosphatase: 112 IU/L (ref 39–117)
BUN/Creatinine Ratio: 5 — ABNORMAL LOW (ref 9–20)
BUN: 38 mg/dL — ABNORMAL HIGH (ref 6–24)
Bilirubin Total: <0.2 mg/dL (ref 0.0–1.2)
CO2: 27 mmol/L (ref 20–29)
Calcium: 9.6 mg/dL (ref 8.7–10.2)
Chloride: 92 mmol/L — ABNORMAL LOW (ref 96–106)
Creatinine, Ser: 8.22 mg/dL — ABNORMAL HIGH (ref 0.76–1.27)
GFR calc Af Amer: 8 mL/min/{1.73_m2} — ABNORMAL LOW (ref >59)
GFR calc non Af Amer: 7 mL/min/{1.73_m2} — ABNORMAL LOW (ref >59)
Globulin, Total: 2.9 g/dL (ref 1.5–4.5)
Glucose: 198 mg/dL — ABNORMAL HIGH (ref 65–99)
Potassium: 5.5 mmol/L — ABNORMAL HIGH (ref 3.5–5.2)
Sodium: 140 mmol/L (ref 134–144)
Total Protein: 6.9 g/dL (ref 6.0–8.5)

## 2016-12-02 DIAGNOSIS — T82818A Embolism of vascular prosthetic devices, implants and grafts, initial encounter: Secondary | ICD-10-CM | POA: Diagnosis not present

## 2016-12-02 DIAGNOSIS — N186 End stage renal disease: Secondary | ICD-10-CM | POA: Diagnosis not present

## 2016-12-02 DIAGNOSIS — E1022 Type 1 diabetes mellitus with diabetic chronic kidney disease: Secondary | ICD-10-CM | POA: Diagnosis not present

## 2016-12-02 DIAGNOSIS — D631 Anemia in chronic kidney disease: Secondary | ICD-10-CM | POA: Diagnosis not present

## 2016-12-02 DIAGNOSIS — D509 Iron deficiency anemia, unspecified: Secondary | ICD-10-CM | POA: Diagnosis not present

## 2016-12-02 DIAGNOSIS — N2581 Secondary hyperparathyroidism of renal origin: Secondary | ICD-10-CM | POA: Diagnosis not present

## 2016-12-05 ENCOUNTER — Other Ambulatory Visit: Payer: Self-pay | Admitting: *Deleted

## 2016-12-05 DIAGNOSIS — E1022 Type 1 diabetes mellitus with diabetic chronic kidney disease: Secondary | ICD-10-CM | POA: Diagnosis not present

## 2016-12-05 DIAGNOSIS — D631 Anemia in chronic kidney disease: Secondary | ICD-10-CM | POA: Diagnosis not present

## 2016-12-05 DIAGNOSIS — T82818A Embolism of vascular prosthetic devices, implants and grafts, initial encounter: Secondary | ICD-10-CM | POA: Diagnosis not present

## 2016-12-05 DIAGNOSIS — D509 Iron deficiency anemia, unspecified: Secondary | ICD-10-CM | POA: Diagnosis not present

## 2016-12-05 DIAGNOSIS — N186 End stage renal disease: Secondary | ICD-10-CM | POA: Diagnosis not present

## 2016-12-05 DIAGNOSIS — N2581 Secondary hyperparathyroidism of renal origin: Secondary | ICD-10-CM | POA: Diagnosis not present

## 2016-12-07 DIAGNOSIS — T82818A Embolism of vascular prosthetic devices, implants and grafts, initial encounter: Secondary | ICD-10-CM | POA: Diagnosis not present

## 2016-12-07 DIAGNOSIS — E1022 Type 1 diabetes mellitus with diabetic chronic kidney disease: Secondary | ICD-10-CM | POA: Diagnosis not present

## 2016-12-07 DIAGNOSIS — D631 Anemia in chronic kidney disease: Secondary | ICD-10-CM | POA: Diagnosis not present

## 2016-12-07 DIAGNOSIS — D509 Iron deficiency anemia, unspecified: Secondary | ICD-10-CM | POA: Diagnosis not present

## 2016-12-07 DIAGNOSIS — N2581 Secondary hyperparathyroidism of renal origin: Secondary | ICD-10-CM | POA: Diagnosis not present

## 2016-12-07 DIAGNOSIS — N186 End stage renal disease: Secondary | ICD-10-CM | POA: Diagnosis not present

## 2016-12-08 ENCOUNTER — Ambulatory Visit (HOSPITAL_COMMUNITY): Admission: RE | Admit: 2016-12-08 | Payer: Medicare Other | Source: Ambulatory Visit | Admitting: Vascular Surgery

## 2016-12-08 ENCOUNTER — Encounter (HOSPITAL_COMMUNITY): Admission: RE | Payer: Self-pay | Source: Ambulatory Visit

## 2016-12-08 SURGERY — A/V FISTULAGRAM
Anesthesia: LOCAL

## 2016-12-09 DIAGNOSIS — E1022 Type 1 diabetes mellitus with diabetic chronic kidney disease: Secondary | ICD-10-CM | POA: Diagnosis not present

## 2016-12-09 DIAGNOSIS — D509 Iron deficiency anemia, unspecified: Secondary | ICD-10-CM | POA: Diagnosis not present

## 2016-12-09 DIAGNOSIS — T82818A Embolism of vascular prosthetic devices, implants and grafts, initial encounter: Secondary | ICD-10-CM | POA: Diagnosis not present

## 2016-12-09 DIAGNOSIS — N2581 Secondary hyperparathyroidism of renal origin: Secondary | ICD-10-CM | POA: Diagnosis not present

## 2016-12-09 DIAGNOSIS — N186 End stage renal disease: Secondary | ICD-10-CM | POA: Diagnosis not present

## 2016-12-09 DIAGNOSIS — D631 Anemia in chronic kidney disease: Secondary | ICD-10-CM | POA: Diagnosis not present

## 2016-12-12 DIAGNOSIS — N2581 Secondary hyperparathyroidism of renal origin: Secondary | ICD-10-CM | POA: Diagnosis not present

## 2016-12-12 DIAGNOSIS — N186 End stage renal disease: Secondary | ICD-10-CM | POA: Diagnosis not present

## 2016-12-12 DIAGNOSIS — E1022 Type 1 diabetes mellitus with diabetic chronic kidney disease: Secondary | ICD-10-CM | POA: Diagnosis not present

## 2016-12-12 DIAGNOSIS — D509 Iron deficiency anemia, unspecified: Secondary | ICD-10-CM | POA: Diagnosis not present

## 2016-12-12 DIAGNOSIS — D631 Anemia in chronic kidney disease: Secondary | ICD-10-CM | POA: Diagnosis not present

## 2016-12-12 DIAGNOSIS — T82818A Embolism of vascular prosthetic devices, implants and grafts, initial encounter: Secondary | ICD-10-CM | POA: Diagnosis not present

## 2016-12-14 DIAGNOSIS — N2581 Secondary hyperparathyroidism of renal origin: Secondary | ICD-10-CM | POA: Diagnosis not present

## 2016-12-14 DIAGNOSIS — D631 Anemia in chronic kidney disease: Secondary | ICD-10-CM | POA: Diagnosis not present

## 2016-12-14 DIAGNOSIS — N186 End stage renal disease: Secondary | ICD-10-CM | POA: Diagnosis not present

## 2016-12-14 DIAGNOSIS — E1022 Type 1 diabetes mellitus with diabetic chronic kidney disease: Secondary | ICD-10-CM | POA: Diagnosis not present

## 2016-12-14 DIAGNOSIS — T82818A Embolism of vascular prosthetic devices, implants and grafts, initial encounter: Secondary | ICD-10-CM | POA: Diagnosis not present

## 2016-12-14 DIAGNOSIS — D509 Iron deficiency anemia, unspecified: Secondary | ICD-10-CM | POA: Diagnosis not present

## 2016-12-16 DIAGNOSIS — T82818A Embolism of vascular prosthetic devices, implants and grafts, initial encounter: Secondary | ICD-10-CM | POA: Diagnosis not present

## 2016-12-16 DIAGNOSIS — N186 End stage renal disease: Secondary | ICD-10-CM | POA: Diagnosis not present

## 2016-12-16 DIAGNOSIS — N2581 Secondary hyperparathyroidism of renal origin: Secondary | ICD-10-CM | POA: Diagnosis not present

## 2016-12-16 DIAGNOSIS — D509 Iron deficiency anemia, unspecified: Secondary | ICD-10-CM | POA: Diagnosis not present

## 2016-12-16 DIAGNOSIS — E1022 Type 1 diabetes mellitus with diabetic chronic kidney disease: Secondary | ICD-10-CM | POA: Diagnosis not present

## 2016-12-16 DIAGNOSIS — D631 Anemia in chronic kidney disease: Secondary | ICD-10-CM | POA: Diagnosis not present

## 2016-12-19 DIAGNOSIS — N2581 Secondary hyperparathyroidism of renal origin: Secondary | ICD-10-CM | POA: Diagnosis not present

## 2016-12-19 DIAGNOSIS — D509 Iron deficiency anemia, unspecified: Secondary | ICD-10-CM | POA: Diagnosis not present

## 2016-12-19 DIAGNOSIS — D631 Anemia in chronic kidney disease: Secondary | ICD-10-CM | POA: Diagnosis not present

## 2016-12-19 DIAGNOSIS — T82818A Embolism of vascular prosthetic devices, implants and grafts, initial encounter: Secondary | ICD-10-CM | POA: Diagnosis not present

## 2016-12-19 DIAGNOSIS — N186 End stage renal disease: Secondary | ICD-10-CM | POA: Diagnosis not present

## 2016-12-19 DIAGNOSIS — E1022 Type 1 diabetes mellitus with diabetic chronic kidney disease: Secondary | ICD-10-CM | POA: Diagnosis not present

## 2016-12-20 ENCOUNTER — Other Ambulatory Visit: Payer: Self-pay | Admitting: Student

## 2016-12-20 DIAGNOSIS — E785 Hyperlipidemia, unspecified: Secondary | ICD-10-CM

## 2016-12-21 DIAGNOSIS — E1022 Type 1 diabetes mellitus with diabetic chronic kidney disease: Secondary | ICD-10-CM | POA: Diagnosis not present

## 2016-12-21 DIAGNOSIS — D631 Anemia in chronic kidney disease: Secondary | ICD-10-CM | POA: Diagnosis not present

## 2016-12-21 DIAGNOSIS — N186 End stage renal disease: Secondary | ICD-10-CM | POA: Diagnosis not present

## 2016-12-21 DIAGNOSIS — N2581 Secondary hyperparathyroidism of renal origin: Secondary | ICD-10-CM | POA: Diagnosis not present

## 2016-12-21 DIAGNOSIS — T82818A Embolism of vascular prosthetic devices, implants and grafts, initial encounter: Secondary | ICD-10-CM | POA: Diagnosis not present

## 2016-12-21 DIAGNOSIS — D509 Iron deficiency anemia, unspecified: Secondary | ICD-10-CM | POA: Diagnosis not present

## 2016-12-22 DIAGNOSIS — M1711 Unilateral primary osteoarthritis, right knee: Secondary | ICD-10-CM | POA: Diagnosis not present

## 2016-12-23 DIAGNOSIS — D509 Iron deficiency anemia, unspecified: Secondary | ICD-10-CM | POA: Diagnosis not present

## 2016-12-23 DIAGNOSIS — T82818A Embolism of vascular prosthetic devices, implants and grafts, initial encounter: Secondary | ICD-10-CM | POA: Diagnosis not present

## 2016-12-23 DIAGNOSIS — Z992 Dependence on renal dialysis: Secondary | ICD-10-CM | POA: Diagnosis not present

## 2016-12-23 DIAGNOSIS — E1022 Type 1 diabetes mellitus with diabetic chronic kidney disease: Secondary | ICD-10-CM | POA: Diagnosis not present

## 2016-12-23 DIAGNOSIS — D631 Anemia in chronic kidney disease: Secondary | ICD-10-CM | POA: Diagnosis not present

## 2016-12-23 DIAGNOSIS — N2581 Secondary hyperparathyroidism of renal origin: Secondary | ICD-10-CM | POA: Diagnosis not present

## 2016-12-23 DIAGNOSIS — N186 End stage renal disease: Secondary | ICD-10-CM | POA: Diagnosis not present

## 2016-12-23 DIAGNOSIS — T8612 Kidney transplant failure: Secondary | ICD-10-CM | POA: Diagnosis not present

## 2016-12-26 DIAGNOSIS — T82818A Embolism of vascular prosthetic devices, implants and grafts, initial encounter: Secondary | ICD-10-CM | POA: Diagnosis not present

## 2016-12-26 DIAGNOSIS — N2581 Secondary hyperparathyroidism of renal origin: Secondary | ICD-10-CM | POA: Diagnosis not present

## 2016-12-26 DIAGNOSIS — D631 Anemia in chronic kidney disease: Secondary | ICD-10-CM | POA: Diagnosis not present

## 2016-12-26 DIAGNOSIS — E1022 Type 1 diabetes mellitus with diabetic chronic kidney disease: Secondary | ICD-10-CM | POA: Diagnosis not present

## 2016-12-26 DIAGNOSIS — N186 End stage renal disease: Secondary | ICD-10-CM | POA: Diagnosis not present

## 2016-12-26 DIAGNOSIS — D509 Iron deficiency anemia, unspecified: Secondary | ICD-10-CM | POA: Diagnosis not present

## 2016-12-28 DIAGNOSIS — N2581 Secondary hyperparathyroidism of renal origin: Secondary | ICD-10-CM | POA: Diagnosis not present

## 2016-12-28 DIAGNOSIS — T82818A Embolism of vascular prosthetic devices, implants and grafts, initial encounter: Secondary | ICD-10-CM | POA: Diagnosis not present

## 2016-12-28 DIAGNOSIS — E1022 Type 1 diabetes mellitus with diabetic chronic kidney disease: Secondary | ICD-10-CM | POA: Diagnosis not present

## 2016-12-28 DIAGNOSIS — S90424A Blister (nonthermal), right lesser toe(s), initial encounter: Secondary | ICD-10-CM | POA: Diagnosis not present

## 2016-12-28 DIAGNOSIS — D631 Anemia in chronic kidney disease: Secondary | ICD-10-CM | POA: Diagnosis not present

## 2016-12-28 DIAGNOSIS — N186 End stage renal disease: Secondary | ICD-10-CM | POA: Diagnosis not present

## 2016-12-28 DIAGNOSIS — D509 Iron deficiency anemia, unspecified: Secondary | ICD-10-CM | POA: Diagnosis not present

## 2016-12-30 DIAGNOSIS — N2581 Secondary hyperparathyroidism of renal origin: Secondary | ICD-10-CM | POA: Diagnosis not present

## 2016-12-30 DIAGNOSIS — T82818A Embolism of vascular prosthetic devices, implants and grafts, initial encounter: Secondary | ICD-10-CM | POA: Diagnosis not present

## 2016-12-30 DIAGNOSIS — D509 Iron deficiency anemia, unspecified: Secondary | ICD-10-CM | POA: Diagnosis not present

## 2016-12-30 DIAGNOSIS — D631 Anemia in chronic kidney disease: Secondary | ICD-10-CM | POA: Diagnosis not present

## 2016-12-30 DIAGNOSIS — E1022 Type 1 diabetes mellitus with diabetic chronic kidney disease: Secondary | ICD-10-CM | POA: Diagnosis not present

## 2016-12-30 DIAGNOSIS — N186 End stage renal disease: Secondary | ICD-10-CM | POA: Diagnosis not present

## 2017-01-01 ENCOUNTER — Other Ambulatory Visit: Payer: Self-pay | Admitting: Student

## 2017-01-02 ENCOUNTER — Encounter: Payer: Self-pay | Admitting: Student

## 2017-01-02 DIAGNOSIS — E1022 Type 1 diabetes mellitus with diabetic chronic kidney disease: Secondary | ICD-10-CM | POA: Diagnosis not present

## 2017-01-02 DIAGNOSIS — D509 Iron deficiency anemia, unspecified: Secondary | ICD-10-CM | POA: Diagnosis not present

## 2017-01-02 DIAGNOSIS — T82818A Embolism of vascular prosthetic devices, implants and grafts, initial encounter: Secondary | ICD-10-CM | POA: Diagnosis not present

## 2017-01-02 DIAGNOSIS — N2581 Secondary hyperparathyroidism of renal origin: Secondary | ICD-10-CM | POA: Diagnosis not present

## 2017-01-02 DIAGNOSIS — N186 End stage renal disease: Secondary | ICD-10-CM | POA: Diagnosis not present

## 2017-01-02 DIAGNOSIS — D631 Anemia in chronic kidney disease: Secondary | ICD-10-CM | POA: Diagnosis not present

## 2017-01-04 DIAGNOSIS — D509 Iron deficiency anemia, unspecified: Secondary | ICD-10-CM | POA: Diagnosis not present

## 2017-01-04 DIAGNOSIS — N186 End stage renal disease: Secondary | ICD-10-CM | POA: Diagnosis not present

## 2017-01-04 DIAGNOSIS — D631 Anemia in chronic kidney disease: Secondary | ICD-10-CM | POA: Diagnosis not present

## 2017-01-04 DIAGNOSIS — E1022 Type 1 diabetes mellitus with diabetic chronic kidney disease: Secondary | ICD-10-CM | POA: Diagnosis not present

## 2017-01-04 DIAGNOSIS — T82818A Embolism of vascular prosthetic devices, implants and grafts, initial encounter: Secondary | ICD-10-CM | POA: Diagnosis not present

## 2017-01-04 DIAGNOSIS — N2581 Secondary hyperparathyroidism of renal origin: Secondary | ICD-10-CM | POA: Diagnosis not present

## 2017-01-06 DIAGNOSIS — D631 Anemia in chronic kidney disease: Secondary | ICD-10-CM | POA: Diagnosis not present

## 2017-01-06 DIAGNOSIS — N186 End stage renal disease: Secondary | ICD-10-CM | POA: Diagnosis not present

## 2017-01-06 DIAGNOSIS — E1022 Type 1 diabetes mellitus with diabetic chronic kidney disease: Secondary | ICD-10-CM | POA: Diagnosis not present

## 2017-01-06 DIAGNOSIS — D509 Iron deficiency anemia, unspecified: Secondary | ICD-10-CM | POA: Diagnosis not present

## 2017-01-06 DIAGNOSIS — N2581 Secondary hyperparathyroidism of renal origin: Secondary | ICD-10-CM | POA: Diagnosis not present

## 2017-01-06 DIAGNOSIS — T82818A Embolism of vascular prosthetic devices, implants and grafts, initial encounter: Secondary | ICD-10-CM | POA: Diagnosis not present

## 2017-01-09 DIAGNOSIS — D631 Anemia in chronic kidney disease: Secondary | ICD-10-CM | POA: Diagnosis not present

## 2017-01-09 DIAGNOSIS — N186 End stage renal disease: Secondary | ICD-10-CM | POA: Diagnosis not present

## 2017-01-09 DIAGNOSIS — D509 Iron deficiency anemia, unspecified: Secondary | ICD-10-CM | POA: Diagnosis not present

## 2017-01-09 DIAGNOSIS — T82818A Embolism of vascular prosthetic devices, implants and grafts, initial encounter: Secondary | ICD-10-CM | POA: Diagnosis not present

## 2017-01-09 DIAGNOSIS — N2581 Secondary hyperparathyroidism of renal origin: Secondary | ICD-10-CM | POA: Diagnosis not present

## 2017-01-09 DIAGNOSIS — E1022 Type 1 diabetes mellitus with diabetic chronic kidney disease: Secondary | ICD-10-CM | POA: Diagnosis not present

## 2017-01-11 DIAGNOSIS — T82818A Embolism of vascular prosthetic devices, implants and grafts, initial encounter: Secondary | ICD-10-CM | POA: Diagnosis not present

## 2017-01-11 DIAGNOSIS — D631 Anemia in chronic kidney disease: Secondary | ICD-10-CM | POA: Diagnosis not present

## 2017-01-11 DIAGNOSIS — N2581 Secondary hyperparathyroidism of renal origin: Secondary | ICD-10-CM | POA: Diagnosis not present

## 2017-01-11 DIAGNOSIS — D509 Iron deficiency anemia, unspecified: Secondary | ICD-10-CM | POA: Diagnosis not present

## 2017-01-11 DIAGNOSIS — N186 End stage renal disease: Secondary | ICD-10-CM | POA: Diagnosis not present

## 2017-01-11 DIAGNOSIS — E1022 Type 1 diabetes mellitus with diabetic chronic kidney disease: Secondary | ICD-10-CM | POA: Diagnosis not present

## 2017-01-12 ENCOUNTER — Ambulatory Visit (INDEPENDENT_AMBULATORY_CARE_PROVIDER_SITE_OTHER): Payer: Medicare Other | Admitting: Cardiovascular Disease

## 2017-01-12 ENCOUNTER — Encounter: Payer: Self-pay | Admitting: Cardiovascular Disease

## 2017-01-12 VITALS — BP 102/68 | HR 82 | Ht 68.0 in | Wt 295.1 lb

## 2017-01-12 DIAGNOSIS — I2510 Atherosclerotic heart disease of native coronary artery without angina pectoris: Secondary | ICD-10-CM | POA: Diagnosis not present

## 2017-01-12 NOTE — Patient Instructions (Signed)
Medication Instructions:  Your physician recommends that you continue on your current medications as directed. Please refer to the Current Medication list given to you today.   Labwork: none  Testing/Procedures: none  Follow-Up: Your physician recommends that you schedule a follow-up appointment in: 6 months. Please call our office in December to schedule this appointment.     Any Other Special Instructions Will Be Listed Below (If Applicable).     If you need a refill on your cardiac medications before your next appointment, please call your pharmacy.

## 2017-01-12 NOTE — Progress Notes (Signed)
Chief Complaint  Patient presents with  . Follow-up    CAD     History of Present Illness: 51 yo male with history of CAD, DM, HTN, OSA, morbid obesity, ESRD on HD, fibromyalgia, chronic anemia, PAD status post left BKA who is here today for cardiac follow up. CAD diagnosed in November 2015. Cardiac cath on 03/19/14 with severe calcified RCA stenosis that was treated with angioplasty and staged rotational atherectomy with placement of a drug eluting stent. The distal RCA lesion was treated with balloon angioplasty alone. Possible allergic reaction to Plavix but did well with Effient. He developed a left groin AV fistula following his cath.  Admitted September 2017 with left arm pain, NSTEMI. Repeat cath in September 2017 with severe disease in the diagonal branch treated with a drug eluting stent. He was discharged on ASA and Brilinta but due to nausea, he was changed to Effient. Echo October 2017 with normal LV systolic function. No valve disease. He has had some chest pain and back pain when his BP is low. He has been on midodrine on dialysis days. Beta blocker has been held due to hypotension.   He is here today for follow up. The patient denies any chest pain, dyspnea, palpitations, lower extremity edema, orthopnea, PND, dizziness, near syncope or syncope.    Primary care: Mercy Riding, MD Nephrology: Mercy Moore  Past Medical History:  Diagnosis Date  . Anal fissure   . Anemia of chronic disease   . Arthritis    "hands, right knee" (03/19/2014) no cartlidge in right knee  . CAD (coronary artery disease)    a. Abnl nuc ->LHC (11/15):  mid to dist Dx 80%, mid RCA 99% (functional CTO), dist RCA 80% >> PCI: balloon angioplasty to mid RCA (could not deliver stent) - staged PCI Rotoblator atherectomy/DES to [facility]. b. NSTEMI 12/2015 s/p DES to diagonal.  . Cholecystitis    a. 08/27/2011  . Chronic diastolic CHF (congestive heart failure) ([place])    a. Echo 3/13:  EF 55-60%;  b. Echo (11/15):   Mild LVH, EF 60-65%, no RWMA, Gr 1 DD, MAC, mild LAE, normal RVF, mild RAE;  c.  Echo 5/16:  severe LVH, EF 55-60%, no RWMA, Gr 1 DD, MAC, mild LAE  . Claustrophobia    when things get around his face.   . Complication of anesthesia   . Difficult intubation    only once in 2015 at [facility] haven't had a problem since then  . Diverticulosis   . ESRD (end stage renal disease) ([place])    a. 1995 s/p cadaveric transplant w/ susbequent failure after 18 yrs;  b.Dialysis initiated 07/2011 [facility] M-W-F  . Fibromyalgia   . Gastroparesis   . GERD (gastroesophageal reflux disease)   . Gout    PMH  . History of blood transfusion   . HLD (hyperlipidemia)   . Hypertension   . Hypothyroidism   . Inguinal lymphadenopathy    a. bilateral - s/p biopsy 07/2011  . Insulin dependent diabetes mellitus (HCC)    Type I  . Monilial esophagitis ([place])   . Morbid obesity ([place])   . OSA (obstructive sleep apnea)    adjustable bed  . PAD (peripheral artery disease) (HCC)    a. s/p L BKA.  [name] Kitchen Pericardial effusion    a.  Small by CT 08/22/11;  b.  Large by CT 08/27/11. c. Not seen on 11/2015 echo.  . Pneumonia ~ 2007  . PONV (postoperative  nausea and vomiting)    vomited once after a procedure.  . Tracheobronchomalacia     Past Surgical History:  Procedure Laterality Date  . A/V FISTULAGRAM Left 07/26/2016   Procedure: A/V Fistulagram;  Surgeon: Waynetta Sandy, MD;  Location: [place] [facility];  Service: Cardiovascular;  Laterality: Left;  . AMPUTATION OF REPLICATED TOES Right   . ANGIOPLASTY  04/09/2014   RCA   . APPENDECTOMY  ~ 2006  . ARTERIOVENOUS GRAFT PLACEMENT Left 1993?   forearm  . ARTERIOVENOUS GRAFT PLACEMENT Right 1993?   leg  . ARTERIOVENOUS GRAFT PLACEMENT Left    Thigh  . Arteriovenous Graft Removed Left    Thigh  . AV FISTULA PLACEMENT  07/29/2011   Procedure: ARTERIOVENOUS (AV) FISTULA CREATION;  Surgeon: Mal Misty, MD;  Location: [name];  Service: Vascular;   Laterality: Right;  Brachial cephalic  . AV FISTULA PLACEMENT Left 01/20/2015   Procedure: LIGATION OF RIGHT ARTERIOVENOUS FISTULA WITH EXCISION OF ANEURYSM;  Surgeon: Angelia Mould, MD;  Location: [place];  Service: Vascular;  Laterality: Left;  . AV FISTULA PLACEMENT Left 04/30/2015   Procedure: CREATION OF LEFT ARM BRACHIO-CEPHALIC ARTERIOVENOUS (AV) FISTULA ;  Surgeon: Angelia Mould, MD;  Location: [place];  Service: Vascular;  Laterality: Left;  . AV Graft PLACEMENT Left 1993?   "attempted one in my wrist; didn't take"  . BELOW KNEE LEG AMPUTATION Left 2010  . CARDIAC CATHETERIZATION  03/19/2014  . CARDIAC CATHETERIZATION  03/19/2014   Procedure: CORONARY BALLOON ANGIOPLASTY;  Surgeon: Burnell Blanks, MD;  Location: [facility] CATH LAB;  Service: Cardiovascular;;  . CARDIAC CATHETERIZATION N/A 01/01/2016   Procedure: Left Heart Cath and Coronary Angiography;  Surgeon: [place] Sine, MD;  Location: [place] [facility];  Service: Cardiovascular;  Laterality: N/A;  . CARDIAC CATHETERIZATION N/A 01/01/2016   Procedure: Coronary Stent Intervention;  Surgeon: [place] Sine, MD;  Location: [place] [facility];  Service: Cardiovascular;  Laterality: N/A;  . CARPAL TUNNEL RELEASE Bilateral after 2005  . CATARACT EXTRACTION W/ INTRAOCULAR LENS  IMPLANT, BILATERAL Bilateral 1990's  . COLONOSCOPY  08/29/2011   Procedure: COLONOSCOPY;  Surgeon: Jerene Bears, MD;  Location: [place];  Service: Gastroenterology;  Laterality: N/A;  . COLONOSCOPY WITH PROPOFOL N/A 04/26/2016   Procedure: COLONOSCOPY WITH PROPOFOL;  Surgeon: Jerene Bears, MD;  Location: [facility]OS[facility];  Service: Gastroenterology;  Laterality: N/A;  . CORONARY ANGIOPLASTY WITH STENT PLACEMENT  04/09/2014       ptca/des mid lad   . ESOPHAGOGASTRODUODENOSCOPY N/A 12/25/2015   Procedure: ESOPHAGOGASTRODUODENOSCOPY (EGD);  Surgeon: Mauri Pole, MD;  Location: [facility] ENDOSCOPY;  Service: Endoscopy;  Laterality: N/A;  bedside  .  ESOPHAGOGASTRODUODENOSCOPY (EGD) WITH PROPOFOL N/A 04/26/2016   Procedure: ESOPHAGOGASTRODUODENOSCOPY (EGD) WITH PROPOFOL;  Surgeon: Jerene Bears, MD;  Location: [facility];  Service: Gastroenterology;  Laterality: N/A;  . EYE SURGERY Bilateral    cataract  . FEMORAL ARTERY REPAIR  04/09/2014   ANGIOSEAL  . INSERTION OF DIALYSIS CATHETER  08/01/2011   Procedure: INSERTION OF DIALYSIS CATHETER;  Surgeon: Rosetta Posner, MD;  Location: [place];  Service: Vascular;  Laterality: Right;  insertion of dialysis catheter on right internal jugular vein  . INSERTION OF DIALYSIS CATHETER Right 01/20/2015   Procedure: INSERTION OF DIALYSIS CATHETER - RIGHT INTERNAL JUGULAR ;  Surgeon: Angelia Mould, MD;  Location: [place];  Service: Vascular;  Laterality: Right;  . KIDNEY TRANSPLANT  04/25/1993  . LEFT HEART CATHETERIZATION  WITH CORONARY ANGIOGRAM N/A 03/19/2014   Procedure: LEFT HEART CATHETERIZATION WITH CORONARY ANGIOGRAM;  Surgeon: Burnell Blanks, MD;  Location: [facility] CATH LAB;  Service: Cardiovascular;  Laterality: N/A;  . LYMPH NODE BIOPSY  08/10/2011   Procedure: LYMPH NODE BIOPSY;  Surgeon: Merrie Roof, MD;  Location: [place];  Service: General;  Laterality: Left;  left groin lymph biopsy  . NEUROPLASTY / TRANSPOSITION ULNAR NERVE AT ELBOW Left after 2005  . PATCH ANGIOPLASTY Left 08/09/2016   Procedure: LEFT  ARTERIOVENOUS FISTULA PATCH ANGIOPLASTY;  Surgeon: Waynetta Sandy, MD;  Location: [place];  Service: Vascular;  Laterality: Left;  . PERCUTANEOUS CORONARY ROTOBLATOR INTERVENTION (PCI-R) N/A 04/09/2014   Procedure: PERCUTANEOUS CORONARY ROTOBLATOR INTERVENTION (PCI-R);  Surgeon: Burnell Blanks, MD;  Location: [facility] CATH LAB;  Service: Cardiovascular;  Laterality: N/A;  . PERIPHERAL VASCULAR CATHETERIZATION N/A 12/25/2014   Procedure: Upper Extremity Venography;  Surgeon: Conrad [place], MD;  Location: [place] [facility];  Service: Cardiovascular;  Laterality: N/A;  . PERIPHERAL  VASCULAR CATHETERIZATION Left 02/24/2016   Procedure: Fistulagram;  Surgeon: Waynetta Sandy, MD;  Location: [place] [facility];  Service: Cardiovascular;  Laterality: Left;  . PERIPHERAL VASCULAR CATHETERIZATION Left 02/24/2016   Procedure: Peripheral Vascular Intervention;  Surgeon: Waynetta Sandy, MD;  Location: [place] [facility];  Service: Cardiovascular;  Laterality: Left;  AV FISTULA  . PERITONEAL CATHETER INSERTION    . PERITONEAL CATHETER REMOVAL    . REVISON OF ARTERIOVENOUS FISTULA Right 9/67/5916   Procedure: PLICATION / REVISION OF ARTERIOVENOUS FISTULA;  Surgeon: Angelia Mould, MD;  Location: [place];  Service: Vascular;  Laterality: Right;  . REVISON OF ARTERIOVENOUS FISTULA Left 09/01/2015   Procedure: SUPERFICIALIZATION OF LEFT ARM BRACHIOCEPHALIC ARTERIOVENOUS FISTULA;  Surgeon: Angelia Mould, MD;  Location: [place];  Service: Vascular;  Laterality: Left;  . REVISON OF ARTERIOVENOUS FISTULA Left 08/09/2016   Procedure: REVISION OF ARTERIAL ANASTOMOSIS OF ARTERIOVENOUS FISTULA;  Surgeon: Waynetta Sandy, MD;  Location: [place];  Service: Vascular;  Laterality: Left;  . SHOULDER ARTHROSCOPY Left ~ 2006   frozen  . TOE AMPUTATION Bilateral after 2005  . UMBILICAL HERNIA REPAIR  1990's  . VENOGRAM Right 07/28/2011   Procedure: VENOGRAM;  Surgeon: Conrad [place], MD;  Location: [facility] CATH LAB;  Service: Cardiovascular;  Laterality: Right;  . VITRECTOMY Bilateral     Current Outpatient Prescriptions  Medication Sig Dispense Refill  . acetaminophen (TYLENOL) 500 MG tablet Take 1,000 mg by mouth every 4 (four) hours as needed for moderate pain or headache.     . allopurinol (ZYLOPRIM) 100 MG tablet TAKE 2 TABLETS BY MOUTH EVERY DAY 60 tablet 3  . aspirin EC 81 MG tablet Take 81 mg by mouth at bedtime.    [name] Kitchen atorvastatin (LIPITOR) 40 MG tablet TAKE 1 TABLET(40 MG) BY MOUTH DAILY 90 tablet 3  . calcium acetate (PHOSLO) 667 MG capsule Take 667-1,334 mg by  mouth 3 (three) times daily with meals. Take 13107ms with meals and 6636m with snacks    . docusate sodium (COLACE) 100 MG capsule Take 200 mg by mouth 2 (two) times daily.     . fenofibrate (TRICOR) 48 MG tablet Take 48 mg by mouth daily.     . fluticasone (FLONASE) 50 MCG/ACT nasal spray Place 2 sprays into both nostrils daily as needed for allergies.     . [name] KitchenOSRENOL 1000 MG PACK Take 1 Package by mouth 3 (three) times daily.  6  . gabapentin (NEURONTIN) 300 MG capsule Take 300 mg by mouth 3 (three) times daily.    [name] Kitchen GLUCAGON EMERGENCY 1 MG injection Inject 1 mg into the muscle daily as needed (low blood sugar).     [name] Kitchen HYDROmorphone (DILAUDID) 2 MG tablet Take 2 mg by mouth 3 (three) times daily as needed for moderate pain or severe pain.    . hydrOXYzine (ATARAX/VISTARIL) 10 MG tablet Take 10 mg by mouth every 8 (eight) hours as needed for anxiety or vomiting.     . insulin lispro (HUMALOG) 100 UNIT/ML injection Inject 1-20 Units into the skin 3 (three) times daily. Blood glucose - 120, 1 unit for every 20 points over 120     . levothyroxine (SYNTHROID, LEVOTHROID) 112 MCG tablet Take 112 mcg by mouth every evening. 2 hours after dinner    . loratadine (CLARITIN) 10 MG tablet Take 10 mg by mouth daily.    . midodrine (PROAMATINE) 10 MG tablet Take 1 tablet (10 mg total) by mouth 3 (three) times daily. Takes 1 tab in mornings of dialysis and takes 2 tabs 30 mins before starting dialysis (m,w,f)    . multivitamin (RENA-VIT) TABS tablet Take 1 tablet by mouth daily.    . mupirocin ointment (BACTROBAN) 2 % Place 1 application into the nose 2 (two) times daily. 15 g 0  . nitroGLYCERIN (NITROSTAT) 0.4 MG SL tablet Place 1 tablet (0.4 mg total) under the tongue every 5 (five) minutes as needed for chest pain. 25 tablet 4  . Nutritional Supplements (FEEDING SUPPLEMENT, NEPRO CARB STEADY,) LIQD Take 237 mLs by mouth daily. 30 Can 0  . omeprazole (PRILOSEC OTC) 20 MG tablet Take 20 mg by mouth daily.    .  OXYGEN Inhale 3 L into the lungs at bedtime.    Vladimir Faster Glycol-Propyl Glycol (SYSTANE) 0.4-0.3 % SOLN Place 1 drop into both eyes as needed (dry eyes).    . polyethylene glycol (MIRALAX / GLYCOLAX) packet Take 17 g by mouth daily. (Patient taking differently: Take 17 g by mouth daily as needed for mild constipation. ) 14 each 0  . prasugrel (EFFIENT) 10 MG TABS tablet Take 10 mg by mouth daily.    . promethazine (PHENERGAN) 25 MG tablet Take 1 tablet (25 mg total) by mouth every 6 (six) hours as needed for nausea or vomiting. For nausea 30 tablet 0  . rOPINIRole (REQUIP) 2 MG tablet Take 2 mg by mouth every evening.     . testosterone cypionate (DEPOTESTOSTERONE CYPIONATE) 200 MG/ML injection INJECT 1 ML INTO THE MUSCLE Q 14 DAYS  1  . VELPHORO 500 MG chewable tablet   10  . Wound Cleansers (WOUND WASH) LIQD Apply 1 application topically daily as needed (wound care).    [name] Kitchen LANTUS SOLOSTAR 100 UNIT/ML Solostar Pen   6   No current facility-administered medications for this visit.    Facility-Administered Medications Ordered in Other Visits  Medication Dose Route Frequency Provider Last Rate Last Dose  . 0.9 %  sodium chloride infusion   Intravenous Continuous Angelia Mould, MD      . Chlorhexidine Gluconate Cloth 2 % PADS 6 each  6 each Topical Once Angelia Mould, MD        Allergies  Allergen Reactions  . Prednisone Other (See Comments)    Increases sugar levels  . Adhesive [Tape] Itching and Other (See Comments)    Blisters on arm from adhesive tape at dialysis   . Amoxicillin  Itching and Rash    Has patient had a PCN reaction causing immediate rash, facial/tongue/throat swelling, SOB or lightheadedness with hypotension: Yes Has patient had a PCN reaction causing severe rash involving mucus membranes or skin necrosis: No Has patient had a PCN reaction that required hospitalization No Has patient had a PCN reaction occurring within the last 10 years: No If all of  the above answers are "NO", then may proceed with Cephalosporin use.  . Clindamycin/Lincomycin Hives and Itching  . Contrast Media [Iodinated Diagnostic Agents] Itching and Rash    Systemic itching and transient rash appearance within hours of receiving contrast    . Enalapril Rash and Cough  . Mobic [Meloxicam] Hives and Rash  . Morphine Hives, Nausea Only and Rash  . Brilinta [Ticagrelor] Nausea And Vomiting  . Ciprofloxacin Itching and Rash  . Clopidogrel Rash  . Codeine Hives, Itching and Rash  . Doxycycline Rash  . Hydrocodone Rash  . Levofloxacin Hives and Itching  . Oxycodone Rash  . Rocephin [Ceftriaxone] Itching    Social History   Social History  . Marital status: Married    Spouse name: N/A  . Number of children: N/A  . Years of education: N/A   Occupational History  . Disabled    Social History Main Topics  . Smoking status: Never Smoker  . Smokeless tobacco: Never Used  . Alcohol use Yes     Comment: occasional  . Drug use: No  . Sexual activity: Not on file   Other Topics Concern  . Not on file   Social History Narrative   Lives @ home with his wife in [place].     Family History  Problem Relation Age of Onset  . Colon polyps Father   . Diabetes Father   . Hypertension Father   . Hypertension Mother   . Diabetes Mother   . Hyperlipidemia Mother   . Breast cancer Maternal Aunt   . Bone cancer Maternal Aunt   . Liver cancer Maternal Aunt   . Malignant hyperthermia Neg Hx   . Heart attack Neg Hx   . Stroke Neg Hx   . Colon cancer Neg Hx   . Stomach cancer Neg Hx   . Pancreatic cancer Neg Hx   . Esophageal cancer Neg Hx     Review of Systems:  As stated in the HPI and otherwise negative.   BP 102/68   Pulse 82   Ht _0  (1.727 m)   Wt 295 lb 1.9 oz (133.9 kg)   SpO2 93%   BMI 44.87 kg/m   Physical Examination:  General: Well developed, well nourished, NAD  HEENT: OP clear, mucus membranes moist  SKIN: warm, dry. No  rashes. Neuro: No focal deficits  Musculoskeletal: Muscle strength 5/5 all ext  Psychiatric: Mood and affect normal  Neck: No JVD, no carotid bruits, no thyromegaly, no lymphadenopathy.  Lungs:Clear bilaterally, no wheezes, rhonci, crackles Cardiovascular: Regular rate and rhythm. No murmurs, gallops or rubs. Abdomen:Soft. Bowel sounds present. Non-tender.  Extremities: No lower extremity edema. Left LE amputation.   Echo 02/03/16: - Procedure narrative: Transthoracic echocardiography. Image   quality was suboptimal. The study was technically difficult.   Intravenous contrast (Definity) was administered. - Left ventricle: The cavity size was normal. Wall thickness was   increased in a pattern of mild LVH. Systolic function was normal.   The estimated ejection fraction was in the range of 55% to 60%.   Wall motion was normal; there were  no regional wall motion   abnormalities. Features are consistent with a pseudonormal left   ventricular filling pattern, with concomitant abnormal relaxation   and increased filling pressure (grade 2 diastolic dysfunction). - Mitral valve: Calcified annulus. - Left atrium: The atrium was mildly dilated.  Cardiac cath Sept 2017:  Conclusion 01/01/16   Ost LAD lesion, 30 %stenosed.  Dist LAD lesion, 30 %stenosed.  Prox RCA to Mid RCA lesion, 0 %stenosed.  1st RPLB lesion, 50 %stenosed.  A STENT XIENCE ALPINE RX 6.71I45 drug eluting stent was successfully placed.  1st Diag lesion, 95 %stenosed.  Post intervention, there is a 0% residual stenosis.  Dist RCA lesion, 30 %stenosed.  Two-vessel coronary obstructive disease with mild calcification of the proximal LAD with 30% narrowing, progressive 95% mid diagonal stenosis, 30% mid LAD narrowing; normal left circumflex coronary artery; and calcified large RCA with a widely patent stent in the mid RCA at the site of previous atherectomy, 30% acute margin narrowing, and 50% inferior branch PDA  stenosis.  Successful PCI to the diagonal vessel with ultimate insertion of a 2.2515 mm Xience Alpine DES stent postdilated with a 2.4 mm with the 95% stenosis being reduced to 0%.    EKG:  EKG is not ordered today. The ekg ordered today demonstrates   Recent Labs: 02/02/2016: B Natriuretic Peptide 1,185.6 10/15/2016: TSH 2.970 11/29/2016: ALT 12; BUN 38; Creatinine, Ser 8.22; Hemoglobin 12.6; Platelets 278; Potassium 5.5; Sodium 140   Lipid Panel    Component Value Date/Time   CHOL 143 02/25/2015 1400   TRIG 255 (H) 02/25/2015 1400   HDL 29 (L) 02/25/2015 1400   CHOLHDL 4.9 02/25/2015 1400   VLDL 51 (H) 02/25/2015 1400   LDLCALC 63 02/25/2015 1400     Wt Readings from Last 3 Encounters:  01/12/17 295 lb 1.9 oz (133.9 kg)  11/29/16 292 lb (132.5 kg)  10/16/16 (!) 300 lb 4.8 oz (136.2 kg)     Other studies Reviewed: Additional studies/ records that were reviewed today include: . Review of the above records demonstrates:   Assessment and Plan:   1. CAD without angina: No chest pain suggestive of angina. He is known to have obstructive CAD with placement of drug eluting stents in the RCA and diagonal branches. Non-obstructive disease in the LAD and Circumflex. Will continue ASA, Effient, statin. Beta blocker held due to hypotension.  He is using midodrine for hypotension on HD days.   2. ESRD on HD  Current medicines are reviewed at length with the patient today.  The patient does not have concerns regarding medicines.  The following changes have been made:  no change  Labs/ tests ordered today include:   No orders of the defined types were placed in this encounter.    Disposition:   FU with me in 6 months   Signed, Lauree Chandler, MD 01/12/2017 10:03 AM    [facility] [place], [place], [place]  [address] Phone: [phone]; Fax: [phone]

## 2017-01-13 DIAGNOSIS — N2581 Secondary hyperparathyroidism of renal origin: Secondary | ICD-10-CM | POA: Diagnosis not present

## 2017-01-13 DIAGNOSIS — T82818A Embolism of vascular prosthetic devices, implants and grafts, initial encounter: Secondary | ICD-10-CM | POA: Diagnosis not present

## 2017-01-13 DIAGNOSIS — N186 End stage renal disease: Secondary | ICD-10-CM | POA: Diagnosis not present

## 2017-01-13 DIAGNOSIS — D631 Anemia in chronic kidney disease: Secondary | ICD-10-CM | POA: Diagnosis not present

## 2017-01-13 DIAGNOSIS — E1022 Type 1 diabetes mellitus with diabetic chronic kidney disease: Secondary | ICD-10-CM | POA: Diagnosis not present

## 2017-01-13 DIAGNOSIS — D509 Iron deficiency anemia, unspecified: Secondary | ICD-10-CM | POA: Diagnosis not present

## 2017-01-16 DIAGNOSIS — N2581 Secondary hyperparathyroidism of renal origin: Secondary | ICD-10-CM | POA: Diagnosis not present

## 2017-01-16 DIAGNOSIS — T82818A Embolism of vascular prosthetic devices, implants and grafts, initial encounter: Secondary | ICD-10-CM | POA: Diagnosis not present

## 2017-01-16 DIAGNOSIS — D509 Iron deficiency anemia, unspecified: Secondary | ICD-10-CM | POA: Diagnosis not present

## 2017-01-16 DIAGNOSIS — E1022 Type 1 diabetes mellitus with diabetic chronic kidney disease: Secondary | ICD-10-CM | POA: Diagnosis not present

## 2017-01-16 DIAGNOSIS — D631 Anemia in chronic kidney disease: Secondary | ICD-10-CM | POA: Diagnosis not present

## 2017-01-16 DIAGNOSIS — N186 End stage renal disease: Secondary | ICD-10-CM | POA: Diagnosis not present

## 2017-01-18 DIAGNOSIS — N186 End stage renal disease: Secondary | ICD-10-CM | POA: Diagnosis not present

## 2017-01-18 DIAGNOSIS — T82818A Embolism of vascular prosthetic devices, implants and grafts, initial encounter: Secondary | ICD-10-CM | POA: Diagnosis not present

## 2017-01-18 DIAGNOSIS — E1022 Type 1 diabetes mellitus with diabetic chronic kidney disease: Secondary | ICD-10-CM | POA: Diagnosis not present

## 2017-01-18 DIAGNOSIS — D631 Anemia in chronic kidney disease: Secondary | ICD-10-CM | POA: Diagnosis not present

## 2017-01-18 DIAGNOSIS — N2581 Secondary hyperparathyroidism of renal origin: Secondary | ICD-10-CM | POA: Diagnosis not present

## 2017-01-18 DIAGNOSIS — D509 Iron deficiency anemia, unspecified: Secondary | ICD-10-CM | POA: Diagnosis not present

## 2017-01-20 DIAGNOSIS — E1022 Type 1 diabetes mellitus with diabetic chronic kidney disease: Secondary | ICD-10-CM | POA: Diagnosis not present

## 2017-01-20 DIAGNOSIS — D631 Anemia in chronic kidney disease: Secondary | ICD-10-CM | POA: Diagnosis not present

## 2017-01-20 DIAGNOSIS — D509 Iron deficiency anemia, unspecified: Secondary | ICD-10-CM | POA: Diagnosis not present

## 2017-01-20 DIAGNOSIS — N186 End stage renal disease: Secondary | ICD-10-CM | POA: Diagnosis not present

## 2017-01-20 DIAGNOSIS — T82818A Embolism of vascular prosthetic devices, implants and grafts, initial encounter: Secondary | ICD-10-CM | POA: Diagnosis not present

## 2017-01-20 DIAGNOSIS — N2581 Secondary hyperparathyroidism of renal origin: Secondary | ICD-10-CM | POA: Diagnosis not present

## 2017-01-22 DIAGNOSIS — N186 End stage renal disease: Secondary | ICD-10-CM | POA: Diagnosis not present

## 2017-01-22 DIAGNOSIS — Z992 Dependence on renal dialysis: Secondary | ICD-10-CM | POA: Diagnosis not present

## 2017-01-22 DIAGNOSIS — T8612 Kidney transplant failure: Secondary | ICD-10-CM | POA: Diagnosis not present

## 2017-01-23 DIAGNOSIS — N2581 Secondary hyperparathyroidism of renal origin: Secondary | ICD-10-CM | POA: Diagnosis not present

## 2017-01-23 DIAGNOSIS — T82818A Embolism of vascular prosthetic devices, implants and grafts, initial encounter: Secondary | ICD-10-CM | POA: Diagnosis not present

## 2017-01-23 DIAGNOSIS — E1022 Type 1 diabetes mellitus with diabetic chronic kidney disease: Secondary | ICD-10-CM | POA: Diagnosis not present

## 2017-01-23 DIAGNOSIS — D509 Iron deficiency anemia, unspecified: Secondary | ICD-10-CM | POA: Diagnosis not present

## 2017-01-23 DIAGNOSIS — R52 Pain, unspecified: Secondary | ICD-10-CM | POA: Diagnosis not present

## 2017-01-23 DIAGNOSIS — D631 Anemia in chronic kidney disease: Secondary | ICD-10-CM | POA: Diagnosis not present

## 2017-01-23 DIAGNOSIS — N186 End stage renal disease: Secondary | ICD-10-CM | POA: Diagnosis not present

## 2017-01-23 DIAGNOSIS — R197 Diarrhea, unspecified: Secondary | ICD-10-CM | POA: Diagnosis not present

## 2017-01-25 DIAGNOSIS — D631 Anemia in chronic kidney disease: Secondary | ICD-10-CM | POA: Diagnosis not present

## 2017-01-25 DIAGNOSIS — N2581 Secondary hyperparathyroidism of renal origin: Secondary | ICD-10-CM | POA: Diagnosis not present

## 2017-01-25 DIAGNOSIS — N186 End stage renal disease: Secondary | ICD-10-CM | POA: Diagnosis not present

## 2017-01-25 DIAGNOSIS — T82818A Embolism of vascular prosthetic devices, implants and grafts, initial encounter: Secondary | ICD-10-CM | POA: Diagnosis not present

## 2017-01-25 DIAGNOSIS — E1022 Type 1 diabetes mellitus with diabetic chronic kidney disease: Secondary | ICD-10-CM | POA: Diagnosis not present

## 2017-01-25 DIAGNOSIS — R197 Diarrhea, unspecified: Secondary | ICD-10-CM | POA: Diagnosis not present

## 2017-01-25 DIAGNOSIS — D509 Iron deficiency anemia, unspecified: Secondary | ICD-10-CM | POA: Diagnosis not present

## 2017-01-25 DIAGNOSIS — R52 Pain, unspecified: Secondary | ICD-10-CM | POA: Diagnosis not present

## 2017-01-27 DIAGNOSIS — E1022 Type 1 diabetes mellitus with diabetic chronic kidney disease: Secondary | ICD-10-CM | POA: Diagnosis not present

## 2017-01-27 DIAGNOSIS — N186 End stage renal disease: Secondary | ICD-10-CM | POA: Diagnosis not present

## 2017-01-27 DIAGNOSIS — T82818A Embolism of vascular prosthetic devices, implants and grafts, initial encounter: Secondary | ICD-10-CM | POA: Diagnosis not present

## 2017-01-27 DIAGNOSIS — N2581 Secondary hyperparathyroidism of renal origin: Secondary | ICD-10-CM | POA: Diagnosis not present

## 2017-01-27 DIAGNOSIS — D509 Iron deficiency anemia, unspecified: Secondary | ICD-10-CM | POA: Diagnosis not present

## 2017-01-27 DIAGNOSIS — D631 Anemia in chronic kidney disease: Secondary | ICD-10-CM | POA: Diagnosis not present

## 2017-01-27 DIAGNOSIS — R52 Pain, unspecified: Secondary | ICD-10-CM | POA: Diagnosis not present

## 2017-01-27 DIAGNOSIS — R197 Diarrhea, unspecified: Secondary | ICD-10-CM | POA: Diagnosis not present

## 2017-01-30 DIAGNOSIS — N186 End stage renal disease: Secondary | ICD-10-CM | POA: Diagnosis not present

## 2017-01-30 DIAGNOSIS — E1022 Type 1 diabetes mellitus with diabetic chronic kidney disease: Secondary | ICD-10-CM | POA: Diagnosis not present

## 2017-01-30 DIAGNOSIS — R197 Diarrhea, unspecified: Secondary | ICD-10-CM | POA: Diagnosis not present

## 2017-01-30 DIAGNOSIS — T82818A Embolism of vascular prosthetic devices, implants and grafts, initial encounter: Secondary | ICD-10-CM | POA: Diagnosis not present

## 2017-01-30 DIAGNOSIS — R52 Pain, unspecified: Secondary | ICD-10-CM | POA: Diagnosis not present

## 2017-01-30 DIAGNOSIS — D631 Anemia in chronic kidney disease: Secondary | ICD-10-CM | POA: Diagnosis not present

## 2017-01-30 DIAGNOSIS — N2581 Secondary hyperparathyroidism of renal origin: Secondary | ICD-10-CM | POA: Diagnosis not present

## 2017-01-30 DIAGNOSIS — D509 Iron deficiency anemia, unspecified: Secondary | ICD-10-CM | POA: Diagnosis not present

## 2017-01-31 DIAGNOSIS — M109 Gout, unspecified: Secondary | ICD-10-CM | POA: Diagnosis not present

## 2017-01-31 DIAGNOSIS — G894 Chronic pain syndrome: Secondary | ICD-10-CM | POA: Diagnosis not present

## 2017-01-31 DIAGNOSIS — I739 Peripheral vascular disease, unspecified: Secondary | ICD-10-CM | POA: Diagnosis not present

## 2017-02-01 DIAGNOSIS — N2581 Secondary hyperparathyroidism of renal origin: Secondary | ICD-10-CM | POA: Diagnosis not present

## 2017-02-01 DIAGNOSIS — R197 Diarrhea, unspecified: Secondary | ICD-10-CM | POA: Diagnosis not present

## 2017-02-01 DIAGNOSIS — R52 Pain, unspecified: Secondary | ICD-10-CM | POA: Diagnosis not present

## 2017-02-01 DIAGNOSIS — N186 End stage renal disease: Secondary | ICD-10-CM | POA: Diagnosis not present

## 2017-02-01 DIAGNOSIS — D631 Anemia in chronic kidney disease: Secondary | ICD-10-CM | POA: Diagnosis not present

## 2017-02-01 DIAGNOSIS — E1040 Type 1 diabetes mellitus with diabetic neuropathy, unspecified: Secondary | ICD-10-CM | POA: Diagnosis not present

## 2017-02-01 DIAGNOSIS — E1022 Type 1 diabetes mellitus with diabetic chronic kidney disease: Secondary | ICD-10-CM | POA: Diagnosis not present

## 2017-02-01 DIAGNOSIS — T82818A Embolism of vascular prosthetic devices, implants and grafts, initial encounter: Secondary | ICD-10-CM | POA: Diagnosis not present

## 2017-02-01 DIAGNOSIS — E108 Type 1 diabetes mellitus with unspecified complications: Secondary | ICD-10-CM | POA: Diagnosis not present

## 2017-02-01 DIAGNOSIS — E1059 Type 1 diabetes mellitus with other circulatory complications: Secondary | ICD-10-CM | POA: Diagnosis not present

## 2017-02-01 DIAGNOSIS — Z794 Long term (current) use of insulin: Secondary | ICD-10-CM | POA: Diagnosis not present

## 2017-02-01 DIAGNOSIS — D509 Iron deficiency anemia, unspecified: Secondary | ICD-10-CM | POA: Diagnosis not present

## 2017-02-02 ENCOUNTER — Ambulatory Visit: Payer: Medicare Other | Admitting: Student

## 2017-02-03 DIAGNOSIS — D631 Anemia in chronic kidney disease: Secondary | ICD-10-CM | POA: Diagnosis not present

## 2017-02-03 DIAGNOSIS — R52 Pain, unspecified: Secondary | ICD-10-CM | POA: Diagnosis not present

## 2017-02-03 DIAGNOSIS — D509 Iron deficiency anemia, unspecified: Secondary | ICD-10-CM | POA: Diagnosis not present

## 2017-02-03 DIAGNOSIS — T82818A Embolism of vascular prosthetic devices, implants and grafts, initial encounter: Secondary | ICD-10-CM | POA: Diagnosis not present

## 2017-02-03 DIAGNOSIS — N186 End stage renal disease: Secondary | ICD-10-CM | POA: Diagnosis not present

## 2017-02-03 DIAGNOSIS — E1022 Type 1 diabetes mellitus with diabetic chronic kidney disease: Secondary | ICD-10-CM | POA: Diagnosis not present

## 2017-02-03 DIAGNOSIS — R197 Diarrhea, unspecified: Secondary | ICD-10-CM | POA: Diagnosis not present

## 2017-02-03 DIAGNOSIS — N2581 Secondary hyperparathyroidism of renal origin: Secondary | ICD-10-CM | POA: Diagnosis not present

## 2017-02-06 DIAGNOSIS — T82818A Embolism of vascular prosthetic devices, implants and grafts, initial encounter: Secondary | ICD-10-CM | POA: Diagnosis not present

## 2017-02-06 DIAGNOSIS — E1022 Type 1 diabetes mellitus with diabetic chronic kidney disease: Secondary | ICD-10-CM | POA: Diagnosis not present

## 2017-02-06 DIAGNOSIS — N186 End stage renal disease: Secondary | ICD-10-CM | POA: Diagnosis not present

## 2017-02-06 DIAGNOSIS — R52 Pain, unspecified: Secondary | ICD-10-CM | POA: Diagnosis not present

## 2017-02-06 DIAGNOSIS — D631 Anemia in chronic kidney disease: Secondary | ICD-10-CM | POA: Diagnosis not present

## 2017-02-06 DIAGNOSIS — N2581 Secondary hyperparathyroidism of renal origin: Secondary | ICD-10-CM | POA: Diagnosis not present

## 2017-02-06 DIAGNOSIS — D509 Iron deficiency anemia, unspecified: Secondary | ICD-10-CM | POA: Diagnosis not present

## 2017-02-06 DIAGNOSIS — R197 Diarrhea, unspecified: Secondary | ICD-10-CM | POA: Diagnosis not present

## 2017-02-08 DIAGNOSIS — D631 Anemia in chronic kidney disease: Secondary | ICD-10-CM | POA: Diagnosis not present

## 2017-02-08 DIAGNOSIS — R197 Diarrhea, unspecified: Secondary | ICD-10-CM | POA: Diagnosis not present

## 2017-02-08 DIAGNOSIS — N186 End stage renal disease: Secondary | ICD-10-CM | POA: Diagnosis not present

## 2017-02-08 DIAGNOSIS — R52 Pain, unspecified: Secondary | ICD-10-CM | POA: Diagnosis not present

## 2017-02-08 DIAGNOSIS — D509 Iron deficiency anemia, unspecified: Secondary | ICD-10-CM | POA: Diagnosis not present

## 2017-02-08 DIAGNOSIS — T82818A Embolism of vascular prosthetic devices, implants and grafts, initial encounter: Secondary | ICD-10-CM | POA: Diagnosis not present

## 2017-02-08 DIAGNOSIS — N2581 Secondary hyperparathyroidism of renal origin: Secondary | ICD-10-CM | POA: Diagnosis not present

## 2017-02-08 DIAGNOSIS — E1022 Type 1 diabetes mellitus with diabetic chronic kidney disease: Secondary | ICD-10-CM | POA: Diagnosis not present

## 2017-02-10 DIAGNOSIS — D631 Anemia in chronic kidney disease: Secondary | ICD-10-CM | POA: Diagnosis not present

## 2017-02-10 DIAGNOSIS — R197 Diarrhea, unspecified: Secondary | ICD-10-CM | POA: Diagnosis not present

## 2017-02-10 DIAGNOSIS — R52 Pain, unspecified: Secondary | ICD-10-CM | POA: Diagnosis not present

## 2017-02-10 DIAGNOSIS — D509 Iron deficiency anemia, unspecified: Secondary | ICD-10-CM | POA: Diagnosis not present

## 2017-02-10 DIAGNOSIS — N186 End stage renal disease: Secondary | ICD-10-CM | POA: Diagnosis not present

## 2017-02-10 DIAGNOSIS — T82818A Embolism of vascular prosthetic devices, implants and grafts, initial encounter: Secondary | ICD-10-CM | POA: Diagnosis not present

## 2017-02-10 DIAGNOSIS — N2581 Secondary hyperparathyroidism of renal origin: Secondary | ICD-10-CM | POA: Diagnosis not present

## 2017-02-10 DIAGNOSIS — E1022 Type 1 diabetes mellitus with diabetic chronic kidney disease: Secondary | ICD-10-CM | POA: Diagnosis not present

## 2017-02-11 IMAGING — DX DG CHEST 2V
2 series · 2 of 2 positions shown · non-contrast
Comparison: 01/28/2016

CLINICAL DATA: Cough and shortness of breath for several hours

EXAM:
CHEST  2 VIEW

[x chest ap]
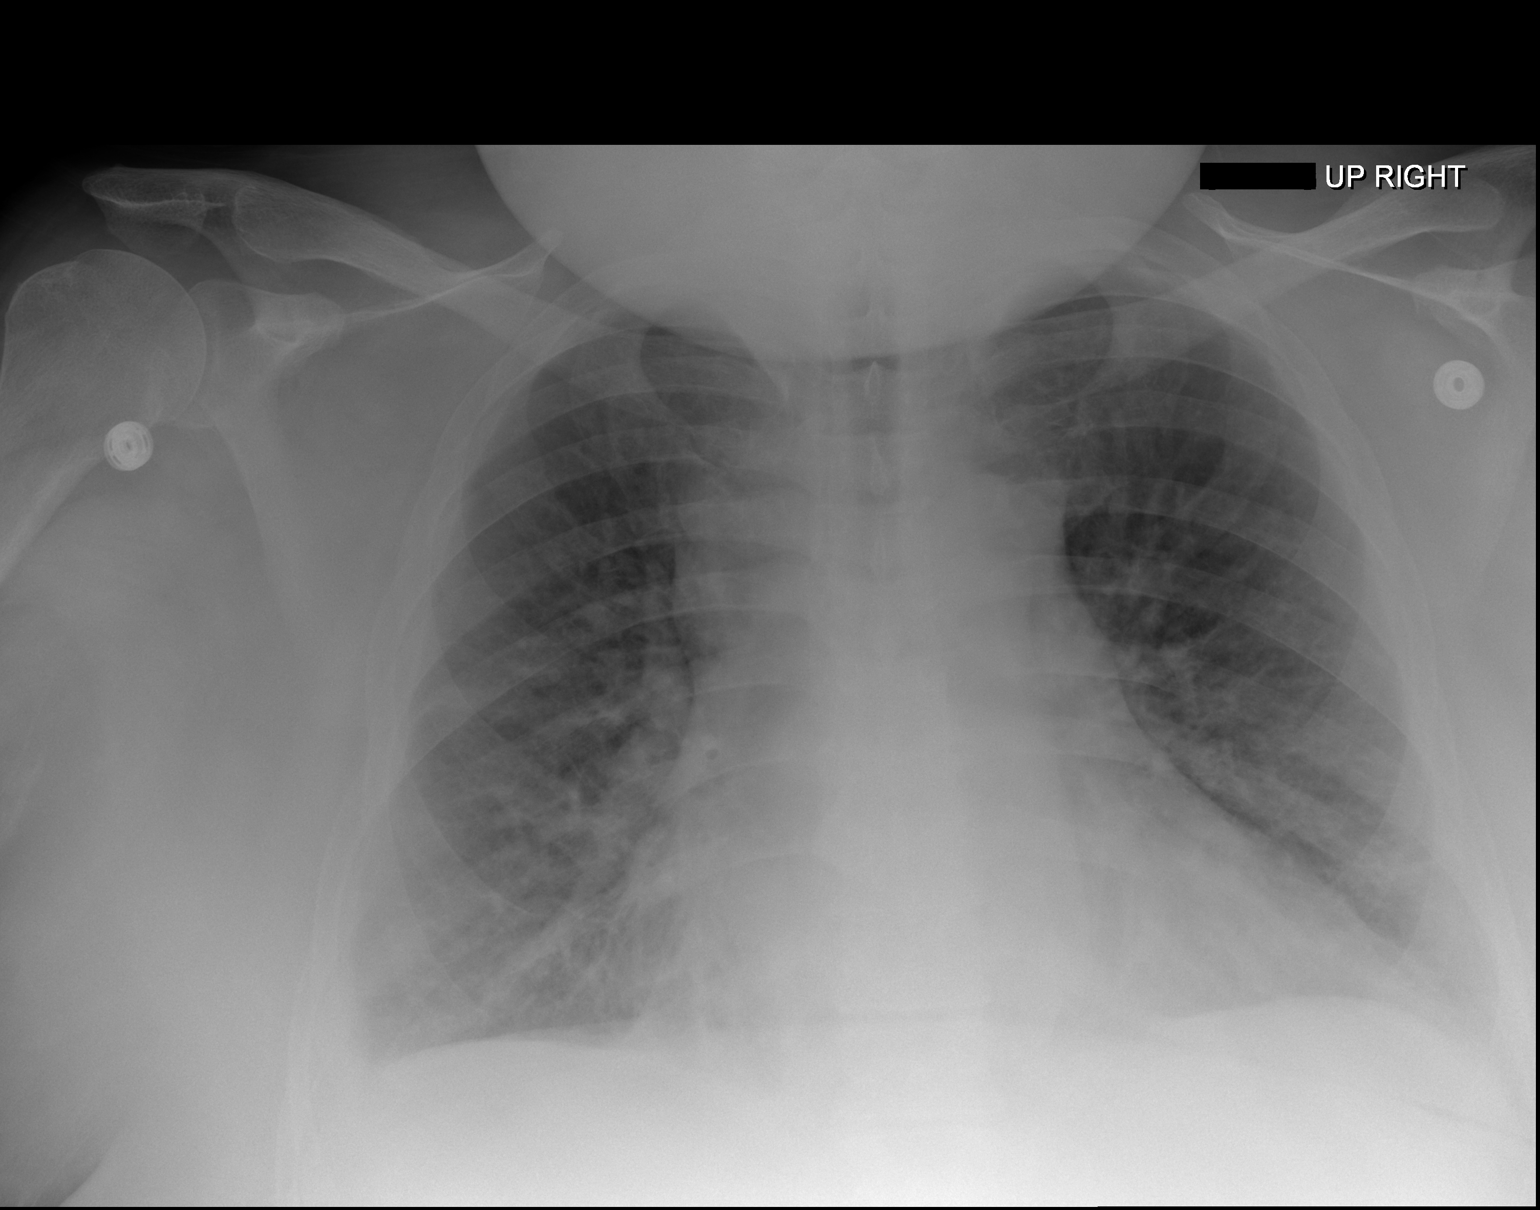

[w chest lat]
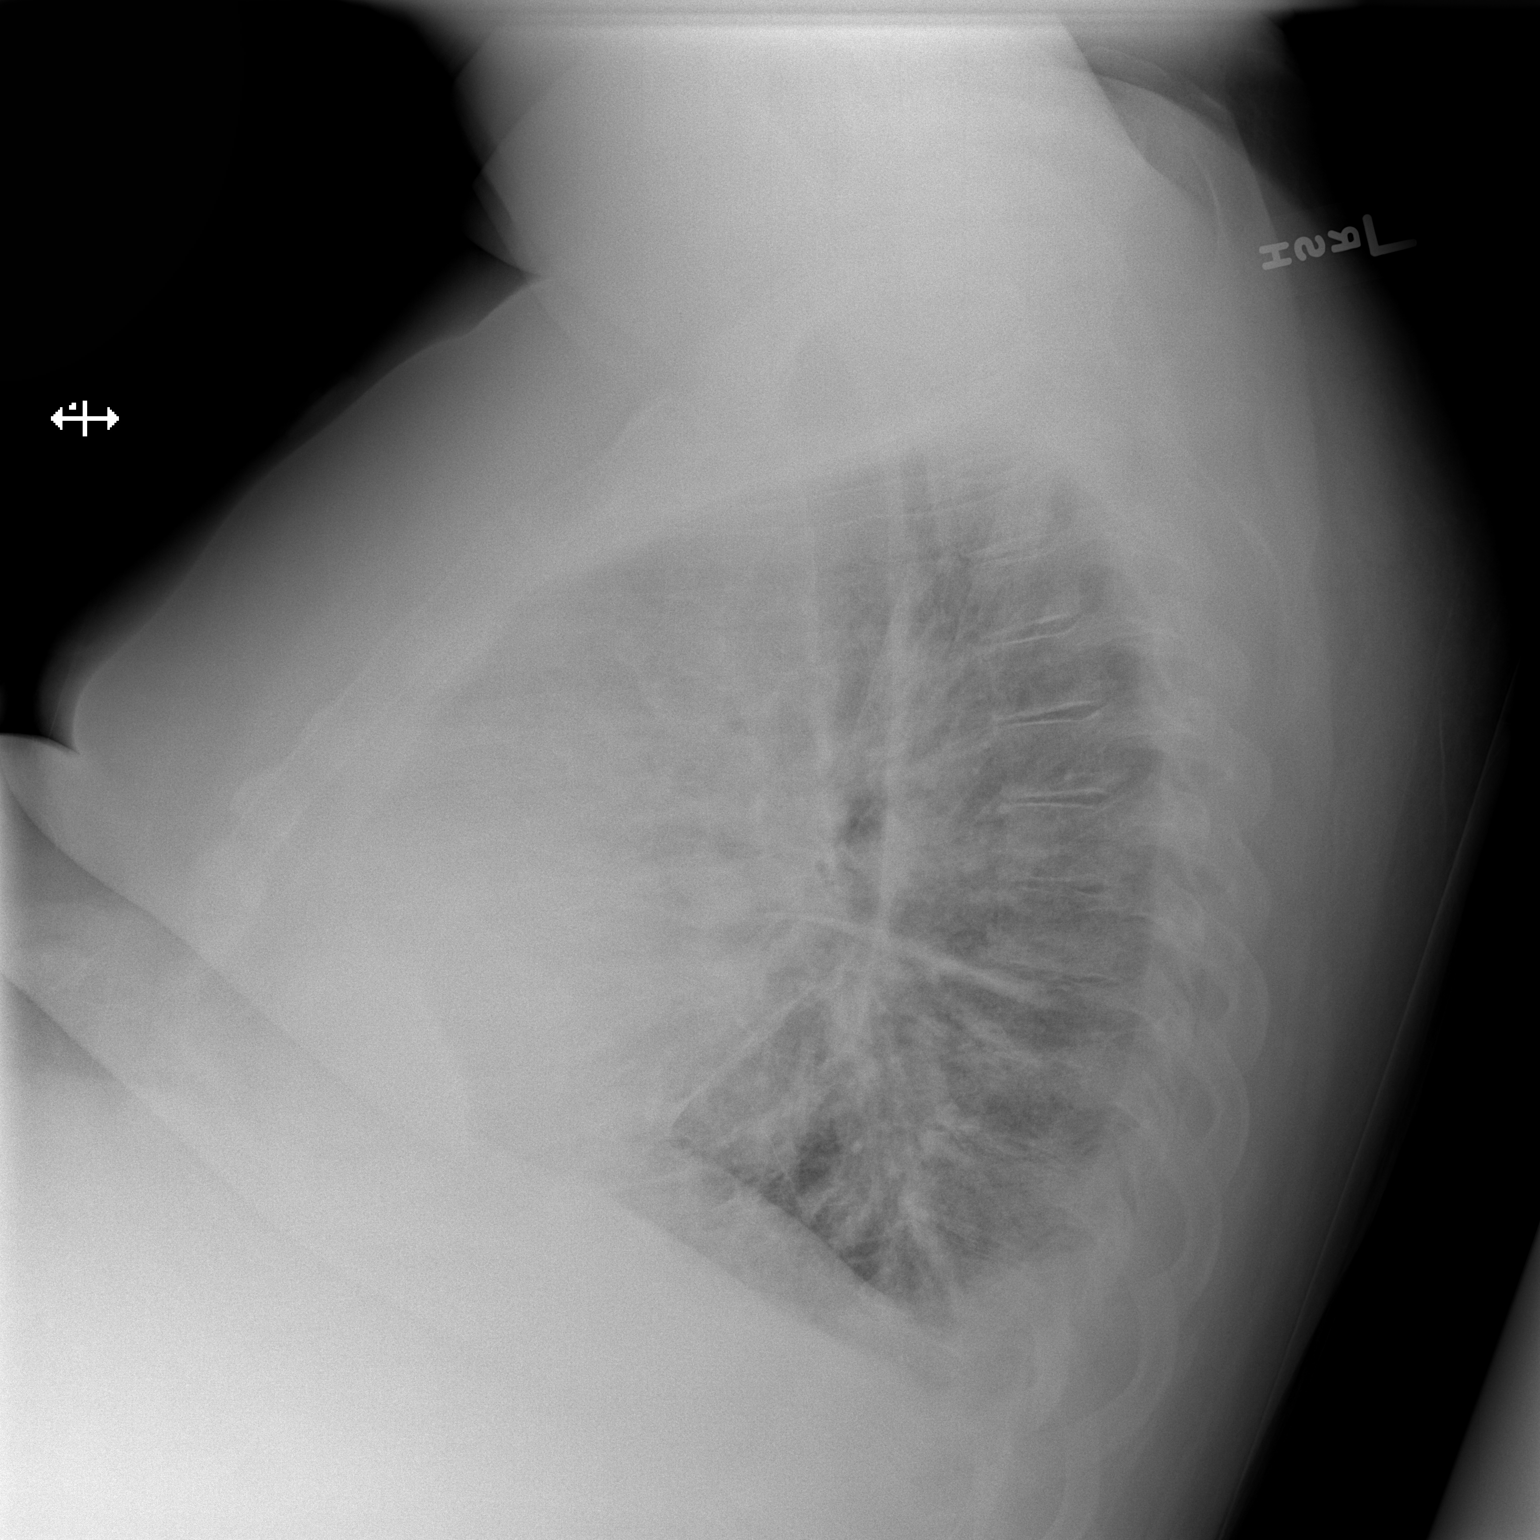

[2 of 2 positions shown; findings below may reference images not displayed]

FINDINGS: Cardiac shadow remains enlarged. Mild central vascular congestion is
noted without significant interstitial edema. No focal infiltrate is
seen. Small bilateral pleural effusions are noted. No acute bony
abnormality is seen.
IMPRESSION: Mild vascular congestion and small pleural effusions. These have
increased in the interval from the prior exam.

## 2017-02-13 DIAGNOSIS — T82818A Embolism of vascular prosthetic devices, implants and grafts, initial encounter: Secondary | ICD-10-CM | POA: Diagnosis not present

## 2017-02-13 DIAGNOSIS — R52 Pain, unspecified: Secondary | ICD-10-CM | POA: Diagnosis not present

## 2017-02-13 DIAGNOSIS — E1022 Type 1 diabetes mellitus with diabetic chronic kidney disease: Secondary | ICD-10-CM | POA: Diagnosis not present

## 2017-02-13 DIAGNOSIS — R197 Diarrhea, unspecified: Secondary | ICD-10-CM | POA: Diagnosis not present

## 2017-02-13 DIAGNOSIS — N2581 Secondary hyperparathyroidism of renal origin: Secondary | ICD-10-CM | POA: Diagnosis not present

## 2017-02-13 DIAGNOSIS — D509 Iron deficiency anemia, unspecified: Secondary | ICD-10-CM | POA: Diagnosis not present

## 2017-02-13 DIAGNOSIS — N186 End stage renal disease: Secondary | ICD-10-CM | POA: Diagnosis not present

## 2017-02-13 DIAGNOSIS — D631 Anemia in chronic kidney disease: Secondary | ICD-10-CM | POA: Diagnosis not present

## 2017-02-14 DIAGNOSIS — M1711 Unilateral primary osteoarthritis, right knee: Secondary | ICD-10-CM | POA: Diagnosis not present

## 2017-02-15 DIAGNOSIS — N186 End stage renal disease: Secondary | ICD-10-CM | POA: Diagnosis not present

## 2017-02-15 DIAGNOSIS — D631 Anemia in chronic kidney disease: Secondary | ICD-10-CM | POA: Diagnosis not present

## 2017-02-15 DIAGNOSIS — D509 Iron deficiency anemia, unspecified: Secondary | ICD-10-CM | POA: Diagnosis not present

## 2017-02-15 DIAGNOSIS — R52 Pain, unspecified: Secondary | ICD-10-CM | POA: Diagnosis not present

## 2017-02-15 DIAGNOSIS — R197 Diarrhea, unspecified: Secondary | ICD-10-CM | POA: Diagnosis not present

## 2017-02-15 DIAGNOSIS — T82818A Embolism of vascular prosthetic devices, implants and grafts, initial encounter: Secondary | ICD-10-CM | POA: Diagnosis not present

## 2017-02-15 DIAGNOSIS — N2581 Secondary hyperparathyroidism of renal origin: Secondary | ICD-10-CM | POA: Diagnosis not present

## 2017-02-15 DIAGNOSIS — E1022 Type 1 diabetes mellitus with diabetic chronic kidney disease: Secondary | ICD-10-CM | POA: Diagnosis not present

## 2017-02-16 DIAGNOSIS — M1711 Unilateral primary osteoarthritis, right knee: Secondary | ICD-10-CM | POA: Diagnosis not present

## 2017-02-17 DIAGNOSIS — N2581 Secondary hyperparathyroidism of renal origin: Secondary | ICD-10-CM | POA: Diagnosis not present

## 2017-02-17 DIAGNOSIS — R52 Pain, unspecified: Secondary | ICD-10-CM | POA: Diagnosis not present

## 2017-02-17 DIAGNOSIS — T82818A Embolism of vascular prosthetic devices, implants and grafts, initial encounter: Secondary | ICD-10-CM | POA: Diagnosis not present

## 2017-02-17 DIAGNOSIS — E1022 Type 1 diabetes mellitus with diabetic chronic kidney disease: Secondary | ICD-10-CM | POA: Diagnosis not present

## 2017-02-17 DIAGNOSIS — N186 End stage renal disease: Secondary | ICD-10-CM | POA: Diagnosis not present

## 2017-02-17 DIAGNOSIS — R197 Diarrhea, unspecified: Secondary | ICD-10-CM | POA: Diagnosis not present

## 2017-02-17 DIAGNOSIS — D509 Iron deficiency anemia, unspecified: Secondary | ICD-10-CM | POA: Diagnosis not present

## 2017-02-17 DIAGNOSIS — D631 Anemia in chronic kidney disease: Secondary | ICD-10-CM | POA: Diagnosis not present

## 2017-02-20 DIAGNOSIS — D631 Anemia in chronic kidney disease: Secondary | ICD-10-CM | POA: Diagnosis not present

## 2017-02-20 DIAGNOSIS — T82818A Embolism of vascular prosthetic devices, implants and grafts, initial encounter: Secondary | ICD-10-CM | POA: Diagnosis not present

## 2017-02-20 DIAGNOSIS — N2581 Secondary hyperparathyroidism of renal origin: Secondary | ICD-10-CM | POA: Diagnosis not present

## 2017-02-20 DIAGNOSIS — R52 Pain, unspecified: Secondary | ICD-10-CM | POA: Diagnosis not present

## 2017-02-20 DIAGNOSIS — E1022 Type 1 diabetes mellitus with diabetic chronic kidney disease: Secondary | ICD-10-CM | POA: Diagnosis not present

## 2017-02-20 DIAGNOSIS — R197 Diarrhea, unspecified: Secondary | ICD-10-CM | POA: Diagnosis not present

## 2017-02-20 DIAGNOSIS — D509 Iron deficiency anemia, unspecified: Secondary | ICD-10-CM | POA: Diagnosis not present

## 2017-02-20 DIAGNOSIS — N186 End stage renal disease: Secondary | ICD-10-CM | POA: Diagnosis not present

## 2017-02-22 DIAGNOSIS — E1022 Type 1 diabetes mellitus with diabetic chronic kidney disease: Secondary | ICD-10-CM | POA: Diagnosis not present

## 2017-02-22 DIAGNOSIS — Z992 Dependence on renal dialysis: Secondary | ICD-10-CM | POA: Diagnosis not present

## 2017-02-22 DIAGNOSIS — D631 Anemia in chronic kidney disease: Secondary | ICD-10-CM | POA: Diagnosis not present

## 2017-02-22 DIAGNOSIS — T8612 Kidney transplant failure: Secondary | ICD-10-CM | POA: Diagnosis not present

## 2017-02-22 DIAGNOSIS — R52 Pain, unspecified: Secondary | ICD-10-CM | POA: Diagnosis not present

## 2017-02-22 DIAGNOSIS — D509 Iron deficiency anemia, unspecified: Secondary | ICD-10-CM | POA: Diagnosis not present

## 2017-02-22 DIAGNOSIS — N186 End stage renal disease: Secondary | ICD-10-CM | POA: Diagnosis not present

## 2017-02-22 DIAGNOSIS — T82818A Embolism of vascular prosthetic devices, implants and grafts, initial encounter: Secondary | ICD-10-CM | POA: Diagnosis not present

## 2017-02-22 DIAGNOSIS — N2581 Secondary hyperparathyroidism of renal origin: Secondary | ICD-10-CM | POA: Diagnosis not present

## 2017-02-22 DIAGNOSIS — R197 Diarrhea, unspecified: Secondary | ICD-10-CM | POA: Diagnosis not present

## 2017-02-23 DIAGNOSIS — E782 Mixed hyperlipidemia: Secondary | ICD-10-CM | POA: Diagnosis not present

## 2017-02-23 DIAGNOSIS — E291 Testicular hypofunction: Secondary | ICD-10-CM | POA: Diagnosis not present

## 2017-02-23 DIAGNOSIS — E039 Hypothyroidism, unspecified: Secondary | ICD-10-CM | POA: Diagnosis not present

## 2017-02-23 DIAGNOSIS — E1059 Type 1 diabetes mellitus with other circulatory complications: Secondary | ICD-10-CM | POA: Diagnosis not present

## 2017-02-24 DIAGNOSIS — N2581 Secondary hyperparathyroidism of renal origin: Secondary | ICD-10-CM | POA: Diagnosis not present

## 2017-02-24 DIAGNOSIS — N186 End stage renal disease: Secondary | ICD-10-CM | POA: Diagnosis not present

## 2017-02-24 DIAGNOSIS — D631 Anemia in chronic kidney disease: Secondary | ICD-10-CM | POA: Diagnosis not present

## 2017-02-24 DIAGNOSIS — D509 Iron deficiency anemia, unspecified: Secondary | ICD-10-CM | POA: Diagnosis not present

## 2017-02-24 DIAGNOSIS — E1022 Type 1 diabetes mellitus with diabetic chronic kidney disease: Secondary | ICD-10-CM | POA: Diagnosis not present

## 2017-02-24 DIAGNOSIS — T82818A Embolism of vascular prosthetic devices, implants and grafts, initial encounter: Secondary | ICD-10-CM | POA: Diagnosis not present

## 2017-03-02 ENCOUNTER — Ambulatory Visit: Payer: Medicare Other | Admitting: Student

## 2017-03-02 DIAGNOSIS — E1065 Type 1 diabetes mellitus with hyperglycemia: Secondary | ICD-10-CM | POA: Diagnosis not present

## 2017-03-02 DIAGNOSIS — E291 Testicular hypofunction: Secondary | ICD-10-CM | POA: Diagnosis not present

## 2017-03-02 DIAGNOSIS — E108 Type 1 diabetes mellitus with unspecified complications: Secondary | ICD-10-CM | POA: Diagnosis not present

## 2017-03-07 DIAGNOSIS — H18832 Recurrent erosion of cornea, left eye: Secondary | ICD-10-CM | POA: Diagnosis not present

## 2017-03-07 DIAGNOSIS — E103532 Type 1 diabetes mellitus with proliferative diabetic retinopathy with traction retinal detachment not involving the macula, left eye: Secondary | ICD-10-CM | POA: Diagnosis not present

## 2017-03-07 DIAGNOSIS — Z794 Long term (current) use of insulin: Secondary | ICD-10-CM | POA: Diagnosis not present

## 2017-03-07 DIAGNOSIS — E103551 Type 1 diabetes mellitus with stable proliferative diabetic retinopathy, right eye: Secondary | ICD-10-CM | POA: Diagnosis not present

## 2017-03-09 ENCOUNTER — Other Ambulatory Visit: Payer: Self-pay | Admitting: Physician Assistant

## 2017-03-10 ENCOUNTER — Other Ambulatory Visit: Payer: Self-pay

## 2017-03-10 ENCOUNTER — Encounter: Payer: Self-pay | Admitting: Student

## 2017-03-10 ENCOUNTER — Ambulatory Visit: Payer: Medicare Other | Admitting: Student

## 2017-03-10 VITALS — BP 124/62 | HR 94 | Temp 98.3°F | Ht 68.0 in | Wt 292.8 lb

## 2017-03-10 DIAGNOSIS — J069 Acute upper respiratory infection, unspecified: Secondary | ICD-10-CM | POA: Diagnosis not present

## 2017-03-10 NOTE — Patient Instructions (Addendum)
It was great seeing you today! It appears that you have a viral upper respiratory infection (Common Cold).  Cold symptoms can last up to 2 weeks.    - Get plenty of rest and adequate hydration. - For nasal stuffiness, try saline nasal spray or a Neti Pot. - For sore in your mouth you can continue using Chloraseptic oral spray - Avoid dairy products, as they can thicken phlegm.  CONTACT YOUR DOCTOR IF YOU EXPERIENCE ANY OF THE FOLLOWING: - High fever, chest pain, shortness of breath or  not able to keep down food or fluids.  - Cough that gets worse while other cold symptoms improve - Your symptoms persist longer than 2 weeks  If we did any lab work today, and the results require attention, either me or my nurse will get in touch with you. If everything is normal, you will get a letter in mail and a message via . If you don't hear from [place] in two weeks, please give [place] a call. Otherwise, we look forward to seeing you again at your next visit. If you have any questions or concerns before then, please call the clinic at [phone].  Please bring all your medications to every doctors visit  Sign up for My Chart to have easy access to your labs results, and communication with your Primary care physician.    Please check-out at the front desk before leaving the clinic.    Take Care,   Dr. Cyndia Skeeters

## 2017-03-10 NOTE — Progress Notes (Signed)
Subjective:    Dale [name] is a 51 y.o. old male here for soreness in his mouth.  Patient is here with his wife  HPI Soreness in his mouth: Reports having cold for the last 4-5 days.  Noted some painful bumps on his tongue.  None on his pounds.  Has been using Chloraseptic oral spray.  He also reports runny nose and nasal congestion especially when he lies down.  Reports "crackling" in his ears when he swallows.  He also reports ear clogging up when he sleeps.  Denies ear ringing or tinnitus. Denies fever, body ache, shortness of breath, cough, chest pain, nausea or vomiting.   PMH/Problem List: has History of renal transplant; S/P BKA (below knee amputation) ([place]); OSA (obstructive sleep apnea); Pericardial effusion; HTN (hypertension); NSTEMI (non-ST elevated myocardial infarction) ([place]); Coronary artery disease involving native coronary artery of native heart with unstable angina pectoris ([place]); Allergic reaction to dye; ESRD on dialysis ([place]); HLD (hyperlipidemia); Pulmonary edema; Chronic pain; Gastrointestinal hemorrhage with melena; Iron deficiency anemia; Anemia of chronic disease; Tracheobronchomalacia; Chronic diastolic heart failure ([place]); IDDM with complications; Oxygen dependent; Visit for preventive health examination; Heme positive stool; and Hypotension (arterial) on their problem list.   has a past medical history of Anal fissure, Anemia of chronic disease, Arthritis, CAD (coronary artery disease), Cholecystitis, Chronic diastolic CHF (congestive heart failure) ([place]), Claustrophobia, Complication of anesthesia, Difficult intubation, Diverticulosis, ESRD (end stage renal disease) ([place]), Fibromyalgia, Gastroparesis, GERD (gastroesophageal reflux disease), Gout, History of blood transfusion, HLD (hyperlipidemia), Hypertension, Hypothyroidism, Inguinal lymphadenopathy, Insulin dependent diabetes mellitus ([place]), Monilial esophagitis ([place]), Morbid obesity ([place]), OSA (obstructive sleep apnea), PAD  (peripheral artery disease) ([place]), Pericardial effusion, Pneumonia (~ 2007), PONV (postoperative nausea and vomiting), and Tracheobronchomalacia.  FH:  Family History  Problem Relation Age of Onset  . Colon polyps Father   . Diabetes Father   . Hypertension Father   . Hypertension Mother   . Diabetes Mother   . Hyperlipidemia Mother   . Breast cancer Maternal Aunt   . Bone cancer Maternal Aunt   . Liver cancer Maternal Aunt   . Malignant hyperthermia Neg Hx   . Heart attack Neg Hx   . Stroke Neg Hx   . Colon cancer Neg Hx   . Stomach cancer Neg Hx   . Pancreatic cancer Neg Hx   . Esophageal cancer Neg Hx     SH Social History   Tobacco Use  . Smoking status: Never Smoker  . Smokeless tobacco: Never Used  Substance Use Topics  . Alcohol use: Yes    Comment: occasional  . Drug use: No    Review of Systems Review of systems negative except for pertinent positives and negatives in history of present illness above.     Objective:     Vitals:   03/10/17 1530  BP: 124/62  Pulse: 94  Temp: 98.3 F (36.8 C)  TempSrc: Oral  SpO2: 96%  Weight: 292 lb 12.8 oz (132.8 kg)  Height: 5\' 8"  (1.727 m)   Body mass index is 44.52 kg/m.  Physical Exam GEN: appears well, no distress, wheelchair-bound EYES: PERRL, EOMI EARS: Ear canal and TM appears normal THROAT: MMM, no exudation or erythema. NECK: Supple RESP:  No IWOB, CTAB CVS:  RRR, normal S1&S2, no murmurs GI: soft, NT with active BS PSYCH: normal affect    Assessment and Plan:  Viral URI: history and exam suggestive for viral URTI. Lung exam normal.  Unlikely flu without fever or myalgia. Has no respiratory  distress. Lesion on the tongue likely due to common cold vs. Herpes. -Recommended conservative management -Discussed return precautions including but not limited to shortness of breath or increased working of breathing, severe persistent cough, persistent fever over 101F, not tolerating fluids by mouth or other  symptoms concerning to patient.   Return if symptoms worsen or fail to improve.  Mercy Riding, MD 03/10/17 Pager: [phone]

## 2017-03-22 DIAGNOSIS — L03211 Cellulitis of face: Secondary | ICD-10-CM | POA: Diagnosis not present

## 2017-03-24 DIAGNOSIS — N186 End stage renal disease: Secondary | ICD-10-CM | POA: Diagnosis not present

## 2017-03-24 DIAGNOSIS — Z992 Dependence on renal dialysis: Secondary | ICD-10-CM | POA: Diagnosis not present

## 2017-03-24 DIAGNOSIS — T8612 Kidney transplant failure: Secondary | ICD-10-CM | POA: Diagnosis not present

## 2017-03-26 ENCOUNTER — Other Ambulatory Visit: Payer: Self-pay | Admitting: Physician Assistant

## 2017-03-27 DIAGNOSIS — N186 End stage renal disease: Secondary | ICD-10-CM | POA: Diagnosis not present

## 2017-03-27 DIAGNOSIS — N2581 Secondary hyperparathyroidism of renal origin: Secondary | ICD-10-CM | POA: Diagnosis not present

## 2017-03-27 DIAGNOSIS — D509 Iron deficiency anemia, unspecified: Secondary | ICD-10-CM | POA: Diagnosis not present

## 2017-03-27 DIAGNOSIS — D631 Anemia in chronic kidney disease: Secondary | ICD-10-CM | POA: Diagnosis not present

## 2017-03-27 DIAGNOSIS — T82818A Embolism of vascular prosthetic devices, implants and grafts, initial encounter: Secondary | ICD-10-CM | POA: Diagnosis not present

## 2017-03-27 NOTE — Telephone Encounter (Addendum)
He should not be taking metoprolol.  Last office note states beta blocker has been held due to hypotension

## 2017-03-27 NOTE — Telephone Encounter (Signed)
Just wanted to clarify that the patient should no longer be taking this medication. Please advise. Thanks, MI

## 2017-03-28 ENCOUNTER — Telehealth: Payer: Self-pay | Admitting: Cardiovascular Disease

## 2017-03-28 DIAGNOSIS — M1711 Unilateral primary osteoarthritis, right knee: Secondary | ICD-10-CM | POA: Diagnosis not present

## 2017-03-28 NOTE — Telephone Encounter (Signed)
Pt calling requesting a refill on metoprolol 25 mg tablet. This medication was D/C and no longer on pt's medication list. Pt stated that his B/Pis ranging from 160/180 and would like to be started back on Metoprolol 25 mg tablet BID. Pt would like a call back. Please address

## 2017-03-28 NOTE — Telephone Encounter (Signed)
Patient was seen in June for hypotension and then went to ER for same.  His metoprolol was stopped at that time.  He was taking 25 mg two times a day except for night before and morning of dialysis days.  SBPs have been running 160-170 for the past month, not low at or after dialysis.  He would like to start back on metoprolol.  He is aware I am forwarding to Dr. [name] and his primary nurse for recommendations and follow up on this.

## 2017-03-29 DIAGNOSIS — D631 Anemia in chronic kidney disease: Secondary | ICD-10-CM | POA: Diagnosis not present

## 2017-03-29 DIAGNOSIS — N2581 Secondary hyperparathyroidism of renal origin: Secondary | ICD-10-CM | POA: Diagnosis not present

## 2017-03-29 DIAGNOSIS — T82818A Embolism of vascular prosthetic devices, implants and grafts, initial encounter: Secondary | ICD-10-CM | POA: Diagnosis not present

## 2017-03-29 DIAGNOSIS — D509 Iron deficiency anemia, unspecified: Secondary | ICD-10-CM | POA: Diagnosis not present

## 2017-03-29 DIAGNOSIS — N186 End stage renal disease: Secondary | ICD-10-CM | POA: Diagnosis not present

## 2017-03-29 MED ORDER — METOPROLOL TARTRATE 25 MG PO TABS: 25.0000 mg | ORAL_TABLET | Freq: Two times a day (BID) | ORAL | 3 refills | Status: DC

## 2017-03-29 NOTE — Telephone Encounter (Signed)
Pt's wife notified. Will send prescription to [facility] in [place].

## 2017-03-29 NOTE — Telephone Encounter (Signed)
Ok to restart metoprolol 25 mg po BID. cdm

## 2017-03-30 ENCOUNTER — Other Ambulatory Visit: Payer: Self-pay | Admitting: Student

## 2017-03-31 DIAGNOSIS — N186 End stage renal disease: Secondary | ICD-10-CM | POA: Diagnosis not present

## 2017-03-31 DIAGNOSIS — D509 Iron deficiency anemia, unspecified: Secondary | ICD-10-CM | POA: Diagnosis not present

## 2017-03-31 DIAGNOSIS — N2581 Secondary hyperparathyroidism of renal origin: Secondary | ICD-10-CM | POA: Diagnosis not present

## 2017-03-31 DIAGNOSIS — T82818A Embolism of vascular prosthetic devices, implants and grafts, initial encounter: Secondary | ICD-10-CM | POA: Diagnosis not present

## 2017-03-31 DIAGNOSIS — D631 Anemia in chronic kidney disease: Secondary | ICD-10-CM | POA: Diagnosis not present

## 2017-04-04 DIAGNOSIS — N186 End stage renal disease: Secondary | ICD-10-CM | POA: Diagnosis not present

## 2017-04-04 DIAGNOSIS — D509 Iron deficiency anemia, unspecified: Secondary | ICD-10-CM | POA: Diagnosis not present

## 2017-04-04 DIAGNOSIS — N2581 Secondary hyperparathyroidism of renal origin: Secondary | ICD-10-CM | POA: Diagnosis not present

## 2017-04-04 DIAGNOSIS — D631 Anemia in chronic kidney disease: Secondary | ICD-10-CM | POA: Diagnosis not present

## 2017-04-04 DIAGNOSIS — T82818A Embolism of vascular prosthetic devices, implants and grafts, initial encounter: Secondary | ICD-10-CM | POA: Diagnosis not present

## 2017-04-05 DIAGNOSIS — T82818A Embolism of vascular prosthetic devices, implants and grafts, initial encounter: Secondary | ICD-10-CM | POA: Diagnosis not present

## 2017-04-05 DIAGNOSIS — N2581 Secondary hyperparathyroidism of renal origin: Secondary | ICD-10-CM | POA: Diagnosis not present

## 2017-04-05 DIAGNOSIS — N186 End stage renal disease: Secondary | ICD-10-CM | POA: Diagnosis not present

## 2017-04-05 DIAGNOSIS — D509 Iron deficiency anemia, unspecified: Secondary | ICD-10-CM | POA: Diagnosis not present

## 2017-04-05 DIAGNOSIS — D631 Anemia in chronic kidney disease: Secondary | ICD-10-CM | POA: Diagnosis not present

## 2017-04-06 DIAGNOSIS — M1711 Unilateral primary osteoarthritis, right knee: Secondary | ICD-10-CM | POA: Diagnosis not present

## 2017-04-07 DIAGNOSIS — D509 Iron deficiency anemia, unspecified: Secondary | ICD-10-CM | POA: Diagnosis not present

## 2017-04-07 DIAGNOSIS — T82818A Embolism of vascular prosthetic devices, implants and grafts, initial encounter: Secondary | ICD-10-CM | POA: Diagnosis not present

## 2017-04-07 DIAGNOSIS — D631 Anemia in chronic kidney disease: Secondary | ICD-10-CM | POA: Diagnosis not present

## 2017-04-07 DIAGNOSIS — N2581 Secondary hyperparathyroidism of renal origin: Secondary | ICD-10-CM | POA: Diagnosis not present

## 2017-04-07 DIAGNOSIS — N186 End stage renal disease: Secondary | ICD-10-CM | POA: Diagnosis not present

## 2017-04-10 DIAGNOSIS — D631 Anemia in chronic kidney disease: Secondary | ICD-10-CM | POA: Diagnosis not present

## 2017-04-10 DIAGNOSIS — N2581 Secondary hyperparathyroidism of renal origin: Secondary | ICD-10-CM | POA: Diagnosis not present

## 2017-04-10 DIAGNOSIS — N186 End stage renal disease: Secondary | ICD-10-CM | POA: Diagnosis not present

## 2017-04-10 DIAGNOSIS — T82818A Embolism of vascular prosthetic devices, implants and grafts, initial encounter: Secondary | ICD-10-CM | POA: Diagnosis not present

## 2017-04-10 DIAGNOSIS — D509 Iron deficiency anemia, unspecified: Secondary | ICD-10-CM | POA: Diagnosis not present

## 2017-04-12 DIAGNOSIS — N186 End stage renal disease: Secondary | ICD-10-CM | POA: Diagnosis not present

## 2017-04-12 DIAGNOSIS — D509 Iron deficiency anemia, unspecified: Secondary | ICD-10-CM | POA: Diagnosis not present

## 2017-04-12 DIAGNOSIS — T82818A Embolism of vascular prosthetic devices, implants and grafts, initial encounter: Secondary | ICD-10-CM | POA: Diagnosis not present

## 2017-04-12 DIAGNOSIS — N2581 Secondary hyperparathyroidism of renal origin: Secondary | ICD-10-CM | POA: Diagnosis not present

## 2017-04-12 DIAGNOSIS — D631 Anemia in chronic kidney disease: Secondary | ICD-10-CM | POA: Diagnosis not present

## 2017-04-14 DIAGNOSIS — N186 End stage renal disease: Secondary | ICD-10-CM | POA: Diagnosis not present

## 2017-04-14 DIAGNOSIS — N2581 Secondary hyperparathyroidism of renal origin: Secondary | ICD-10-CM | POA: Diagnosis not present

## 2017-04-14 DIAGNOSIS — D631 Anemia in chronic kidney disease: Secondary | ICD-10-CM | POA: Diagnosis not present

## 2017-04-14 DIAGNOSIS — T82818A Embolism of vascular prosthetic devices, implants and grafts, initial encounter: Secondary | ICD-10-CM | POA: Diagnosis not present

## 2017-04-14 DIAGNOSIS — D509 Iron deficiency anemia, unspecified: Secondary | ICD-10-CM | POA: Diagnosis not present

## 2017-04-16 DIAGNOSIS — D509 Iron deficiency anemia, unspecified: Secondary | ICD-10-CM | POA: Diagnosis not present

## 2017-04-16 DIAGNOSIS — N2581 Secondary hyperparathyroidism of renal origin: Secondary | ICD-10-CM | POA: Diagnosis not present

## 2017-04-16 DIAGNOSIS — D631 Anemia in chronic kidney disease: Secondary | ICD-10-CM | POA: Diagnosis not present

## 2017-04-16 DIAGNOSIS — N186 End stage renal disease: Secondary | ICD-10-CM | POA: Diagnosis not present

## 2017-04-16 DIAGNOSIS — T82818A Embolism of vascular prosthetic devices, implants and grafts, initial encounter: Secondary | ICD-10-CM | POA: Diagnosis not present

## 2017-04-19 DIAGNOSIS — D631 Anemia in chronic kidney disease: Secondary | ICD-10-CM | POA: Diagnosis not present

## 2017-04-19 DIAGNOSIS — N186 End stage renal disease: Secondary | ICD-10-CM | POA: Diagnosis not present

## 2017-04-19 DIAGNOSIS — T82818A Embolism of vascular prosthetic devices, implants and grafts, initial encounter: Secondary | ICD-10-CM | POA: Diagnosis not present

## 2017-04-19 DIAGNOSIS — D509 Iron deficiency anemia, unspecified: Secondary | ICD-10-CM | POA: Diagnosis not present

## 2017-04-19 DIAGNOSIS — N2581 Secondary hyperparathyroidism of renal origin: Secondary | ICD-10-CM | POA: Diagnosis not present

## 2017-04-21 DIAGNOSIS — T82818A Embolism of vascular prosthetic devices, implants and grafts, initial encounter: Secondary | ICD-10-CM | POA: Diagnosis not present

## 2017-04-21 DIAGNOSIS — D509 Iron deficiency anemia, unspecified: Secondary | ICD-10-CM | POA: Diagnosis not present

## 2017-04-21 DIAGNOSIS — N2581 Secondary hyperparathyroidism of renal origin: Secondary | ICD-10-CM | POA: Diagnosis not present

## 2017-04-21 DIAGNOSIS — N186 End stage renal disease: Secondary | ICD-10-CM | POA: Diagnosis not present

## 2017-04-21 DIAGNOSIS — D631 Anemia in chronic kidney disease: Secondary | ICD-10-CM | POA: Diagnosis not present

## 2017-04-23 DIAGNOSIS — N2581 Secondary hyperparathyroidism of renal origin: Secondary | ICD-10-CM | POA: Diagnosis not present

## 2017-04-23 DIAGNOSIS — D631 Anemia in chronic kidney disease: Secondary | ICD-10-CM | POA: Diagnosis not present

## 2017-04-23 DIAGNOSIS — D509 Iron deficiency anemia, unspecified: Secondary | ICD-10-CM | POA: Diagnosis not present

## 2017-04-23 DIAGNOSIS — N186 End stage renal disease: Secondary | ICD-10-CM | POA: Diagnosis not present

## 2017-04-23 DIAGNOSIS — T82818A Embolism of vascular prosthetic devices, implants and grafts, initial encounter: Secondary | ICD-10-CM | POA: Diagnosis not present

## 2017-04-24 DIAGNOSIS — T8612 Kidney transplant failure: Secondary | ICD-10-CM | POA: Diagnosis not present

## 2017-04-24 DIAGNOSIS — N186 End stage renal disease: Secondary | ICD-10-CM | POA: Diagnosis not present

## 2017-04-24 DIAGNOSIS — Z992 Dependence on renal dialysis: Secondary | ICD-10-CM | POA: Diagnosis not present

## 2017-04-26 DIAGNOSIS — D509 Iron deficiency anemia, unspecified: Secondary | ICD-10-CM | POA: Diagnosis not present

## 2017-04-26 DIAGNOSIS — E1022 Type 1 diabetes mellitus with diabetic chronic kidney disease: Secondary | ICD-10-CM | POA: Diagnosis not present

## 2017-04-26 DIAGNOSIS — D631 Anemia in chronic kidney disease: Secondary | ICD-10-CM | POA: Diagnosis not present

## 2017-04-26 DIAGNOSIS — N186 End stage renal disease: Secondary | ICD-10-CM | POA: Diagnosis not present

## 2017-04-26 DIAGNOSIS — N2581 Secondary hyperparathyroidism of renal origin: Secondary | ICD-10-CM | POA: Diagnosis not present

## 2017-04-26 DIAGNOSIS — T82818A Embolism of vascular prosthetic devices, implants and grafts, initial encounter: Secondary | ICD-10-CM | POA: Diagnosis not present

## 2017-04-28 DIAGNOSIS — N186 End stage renal disease: Secondary | ICD-10-CM | POA: Diagnosis not present

## 2017-04-28 DIAGNOSIS — D631 Anemia in chronic kidney disease: Secondary | ICD-10-CM | POA: Diagnosis not present

## 2017-04-28 DIAGNOSIS — D509 Iron deficiency anemia, unspecified: Secondary | ICD-10-CM | POA: Diagnosis not present

## 2017-04-28 DIAGNOSIS — N2581 Secondary hyperparathyroidism of renal origin: Secondary | ICD-10-CM | POA: Diagnosis not present

## 2017-04-28 DIAGNOSIS — E1022 Type 1 diabetes mellitus with diabetic chronic kidney disease: Secondary | ICD-10-CM | POA: Diagnosis not present

## 2017-04-28 DIAGNOSIS — T82818A Embolism of vascular prosthetic devices, implants and grafts, initial encounter: Secondary | ICD-10-CM | POA: Diagnosis not present

## 2017-05-01 DIAGNOSIS — N186 End stage renal disease: Secondary | ICD-10-CM | POA: Diagnosis not present

## 2017-05-01 DIAGNOSIS — T82818A Embolism of vascular prosthetic devices, implants and grafts, initial encounter: Secondary | ICD-10-CM | POA: Diagnosis not present

## 2017-05-01 DIAGNOSIS — D631 Anemia in chronic kidney disease: Secondary | ICD-10-CM | POA: Diagnosis not present

## 2017-05-01 DIAGNOSIS — D509 Iron deficiency anemia, unspecified: Secondary | ICD-10-CM | POA: Diagnosis not present

## 2017-05-01 DIAGNOSIS — E1022 Type 1 diabetes mellitus with diabetic chronic kidney disease: Secondary | ICD-10-CM | POA: Diagnosis not present

## 2017-05-01 DIAGNOSIS — N2581 Secondary hyperparathyroidism of renal origin: Secondary | ICD-10-CM | POA: Diagnosis not present

## 2017-05-03 DIAGNOSIS — N2581 Secondary hyperparathyroidism of renal origin: Secondary | ICD-10-CM | POA: Diagnosis not present

## 2017-05-03 DIAGNOSIS — N186 End stage renal disease: Secondary | ICD-10-CM | POA: Diagnosis not present

## 2017-05-03 DIAGNOSIS — E1022 Type 1 diabetes mellitus with diabetic chronic kidney disease: Secondary | ICD-10-CM | POA: Diagnosis not present

## 2017-05-03 DIAGNOSIS — D631 Anemia in chronic kidney disease: Secondary | ICD-10-CM | POA: Diagnosis not present

## 2017-05-03 DIAGNOSIS — T82818A Embolism of vascular prosthetic devices, implants and grafts, initial encounter: Secondary | ICD-10-CM | POA: Diagnosis not present

## 2017-05-03 DIAGNOSIS — D509 Iron deficiency anemia, unspecified: Secondary | ICD-10-CM | POA: Diagnosis not present

## 2017-05-05 ENCOUNTER — Other Ambulatory Visit: Payer: Self-pay | Admitting: Student

## 2017-05-05 DIAGNOSIS — T82818A Embolism of vascular prosthetic devices, implants and grafts, initial encounter: Secondary | ICD-10-CM | POA: Diagnosis not present

## 2017-05-05 DIAGNOSIS — D509 Iron deficiency anemia, unspecified: Secondary | ICD-10-CM | POA: Diagnosis not present

## 2017-05-05 DIAGNOSIS — E1022 Type 1 diabetes mellitus with diabetic chronic kidney disease: Secondary | ICD-10-CM | POA: Diagnosis not present

## 2017-05-05 DIAGNOSIS — D631 Anemia in chronic kidney disease: Secondary | ICD-10-CM | POA: Diagnosis not present

## 2017-05-05 DIAGNOSIS — N186 End stage renal disease: Secondary | ICD-10-CM | POA: Diagnosis not present

## 2017-05-05 DIAGNOSIS — N2581 Secondary hyperparathyroidism of renal origin: Secondary | ICD-10-CM | POA: Diagnosis not present

## 2017-05-08 DIAGNOSIS — N2581 Secondary hyperparathyroidism of renal origin: Secondary | ICD-10-CM | POA: Diagnosis not present

## 2017-05-08 DIAGNOSIS — E1022 Type 1 diabetes mellitus with diabetic chronic kidney disease: Secondary | ICD-10-CM | POA: Diagnosis not present

## 2017-05-08 DIAGNOSIS — D509 Iron deficiency anemia, unspecified: Secondary | ICD-10-CM | POA: Diagnosis not present

## 2017-05-08 DIAGNOSIS — N186 End stage renal disease: Secondary | ICD-10-CM | POA: Diagnosis not present

## 2017-05-08 DIAGNOSIS — T82818A Embolism of vascular prosthetic devices, implants and grafts, initial encounter: Secondary | ICD-10-CM | POA: Diagnosis not present

## 2017-05-08 DIAGNOSIS — D631 Anemia in chronic kidney disease: Secondary | ICD-10-CM | POA: Diagnosis not present

## 2017-05-10 DIAGNOSIS — D509 Iron deficiency anemia, unspecified: Secondary | ICD-10-CM | POA: Diagnosis not present

## 2017-05-10 DIAGNOSIS — T82818A Embolism of vascular prosthetic devices, implants and grafts, initial encounter: Secondary | ICD-10-CM | POA: Diagnosis not present

## 2017-05-10 DIAGNOSIS — D631 Anemia in chronic kidney disease: Secondary | ICD-10-CM | POA: Diagnosis not present

## 2017-05-10 DIAGNOSIS — E1022 Type 1 diabetes mellitus with diabetic chronic kidney disease: Secondary | ICD-10-CM | POA: Diagnosis not present

## 2017-05-10 DIAGNOSIS — N2581 Secondary hyperparathyroidism of renal origin: Secondary | ICD-10-CM | POA: Diagnosis not present

## 2017-05-10 DIAGNOSIS — E103532 Type 1 diabetes mellitus with proliferative diabetic retinopathy with traction retinal detachment not involving the macula, left eye: Secondary | ICD-10-CM | POA: Diagnosis not present

## 2017-05-10 DIAGNOSIS — E103551 Type 1 diabetes mellitus with stable proliferative diabetic retinopathy, right eye: Secondary | ICD-10-CM | POA: Diagnosis not present

## 2017-05-10 DIAGNOSIS — N186 End stage renal disease: Secondary | ICD-10-CM | POA: Diagnosis not present

## 2017-05-10 DIAGNOSIS — H189 Unspecified disorder of cornea: Secondary | ICD-10-CM | POA: Diagnosis not present

## 2017-05-12 DIAGNOSIS — D509 Iron deficiency anemia, unspecified: Secondary | ICD-10-CM | POA: Diagnosis not present

## 2017-05-12 DIAGNOSIS — N186 End stage renal disease: Secondary | ICD-10-CM | POA: Diagnosis not present

## 2017-05-12 DIAGNOSIS — D631 Anemia in chronic kidney disease: Secondary | ICD-10-CM | POA: Diagnosis not present

## 2017-05-12 DIAGNOSIS — T82818A Embolism of vascular prosthetic devices, implants and grafts, initial encounter: Secondary | ICD-10-CM | POA: Diagnosis not present

## 2017-05-12 DIAGNOSIS — N2581 Secondary hyperparathyroidism of renal origin: Secondary | ICD-10-CM | POA: Diagnosis not present

## 2017-05-12 DIAGNOSIS — E1022 Type 1 diabetes mellitus with diabetic chronic kidney disease: Secondary | ICD-10-CM | POA: Diagnosis not present

## 2017-05-15 DIAGNOSIS — T82818A Embolism of vascular prosthetic devices, implants and grafts, initial encounter: Secondary | ICD-10-CM | POA: Diagnosis not present

## 2017-05-15 DIAGNOSIS — D509 Iron deficiency anemia, unspecified: Secondary | ICD-10-CM | POA: Diagnosis not present

## 2017-05-15 DIAGNOSIS — E1022 Type 1 diabetes mellitus with diabetic chronic kidney disease: Secondary | ICD-10-CM | POA: Diagnosis not present

## 2017-05-15 DIAGNOSIS — D631 Anemia in chronic kidney disease: Secondary | ICD-10-CM | POA: Diagnosis not present

## 2017-05-15 DIAGNOSIS — N186 End stage renal disease: Secondary | ICD-10-CM | POA: Diagnosis not present

## 2017-05-15 DIAGNOSIS — N2581 Secondary hyperparathyroidism of renal origin: Secondary | ICD-10-CM | POA: Diagnosis not present

## 2017-05-17 DIAGNOSIS — D631 Anemia in chronic kidney disease: Secondary | ICD-10-CM | POA: Diagnosis not present

## 2017-05-17 DIAGNOSIS — N186 End stage renal disease: Secondary | ICD-10-CM | POA: Diagnosis not present

## 2017-05-17 DIAGNOSIS — D509 Iron deficiency anemia, unspecified: Secondary | ICD-10-CM | POA: Diagnosis not present

## 2017-05-17 DIAGNOSIS — T82818A Embolism of vascular prosthetic devices, implants and grafts, initial encounter: Secondary | ICD-10-CM | POA: Diagnosis not present

## 2017-05-17 DIAGNOSIS — E1022 Type 1 diabetes mellitus with diabetic chronic kidney disease: Secondary | ICD-10-CM | POA: Diagnosis not present

## 2017-05-17 DIAGNOSIS — N2581 Secondary hyperparathyroidism of renal origin: Secondary | ICD-10-CM | POA: Diagnosis not present

## 2017-05-19 DIAGNOSIS — D509 Iron deficiency anemia, unspecified: Secondary | ICD-10-CM | POA: Diagnosis not present

## 2017-05-19 DIAGNOSIS — N2581 Secondary hyperparathyroidism of renal origin: Secondary | ICD-10-CM | POA: Diagnosis not present

## 2017-05-19 DIAGNOSIS — T82818A Embolism of vascular prosthetic devices, implants and grafts, initial encounter: Secondary | ICD-10-CM | POA: Diagnosis not present

## 2017-05-19 DIAGNOSIS — N186 End stage renal disease: Secondary | ICD-10-CM | POA: Diagnosis not present

## 2017-05-19 DIAGNOSIS — E1022 Type 1 diabetes mellitus with diabetic chronic kidney disease: Secondary | ICD-10-CM | POA: Diagnosis not present

## 2017-05-19 DIAGNOSIS — D631 Anemia in chronic kidney disease: Secondary | ICD-10-CM | POA: Diagnosis not present

## 2017-05-22 DIAGNOSIS — N2581 Secondary hyperparathyroidism of renal origin: Secondary | ICD-10-CM | POA: Diagnosis not present

## 2017-05-22 DIAGNOSIS — T82818A Embolism of vascular prosthetic devices, implants and grafts, initial encounter: Secondary | ICD-10-CM | POA: Diagnosis not present

## 2017-05-22 DIAGNOSIS — D509 Iron deficiency anemia, unspecified: Secondary | ICD-10-CM | POA: Diagnosis not present

## 2017-05-22 DIAGNOSIS — N186 End stage renal disease: Secondary | ICD-10-CM | POA: Diagnosis not present

## 2017-05-22 DIAGNOSIS — D631 Anemia in chronic kidney disease: Secondary | ICD-10-CM | POA: Diagnosis not present

## 2017-05-22 DIAGNOSIS — E1022 Type 1 diabetes mellitus with diabetic chronic kidney disease: Secondary | ICD-10-CM | POA: Diagnosis not present

## 2017-05-23 DIAGNOSIS — H189 Unspecified disorder of cornea: Secondary | ICD-10-CM | POA: Diagnosis not present

## 2017-05-24 DIAGNOSIS — D631 Anemia in chronic kidney disease: Secondary | ICD-10-CM | POA: Diagnosis not present

## 2017-05-24 DIAGNOSIS — N186 End stage renal disease: Secondary | ICD-10-CM | POA: Diagnosis not present

## 2017-05-24 DIAGNOSIS — E1022 Type 1 diabetes mellitus with diabetic chronic kidney disease: Secondary | ICD-10-CM | POA: Diagnosis not present

## 2017-05-24 DIAGNOSIS — N2581 Secondary hyperparathyroidism of renal origin: Secondary | ICD-10-CM | POA: Diagnosis not present

## 2017-05-24 DIAGNOSIS — T82818A Embolism of vascular prosthetic devices, implants and grafts, initial encounter: Secondary | ICD-10-CM | POA: Diagnosis not present

## 2017-05-24 DIAGNOSIS — D509 Iron deficiency anemia, unspecified: Secondary | ICD-10-CM | POA: Diagnosis not present

## 2017-05-25 DIAGNOSIS — Z992 Dependence on renal dialysis: Secondary | ICD-10-CM | POA: Diagnosis not present

## 2017-05-25 DIAGNOSIS — N186 End stage renal disease: Secondary | ICD-10-CM | POA: Diagnosis not present

## 2017-05-25 DIAGNOSIS — T8612 Kidney transplant failure: Secondary | ICD-10-CM | POA: Diagnosis not present

## 2017-05-25 DIAGNOSIS — Z794 Long term (current) use of insulin: Secondary | ICD-10-CM | POA: Diagnosis not present

## 2017-05-25 DIAGNOSIS — E1040 Type 1 diabetes mellitus with diabetic neuropathy, unspecified: Secondary | ICD-10-CM | POA: Diagnosis not present

## 2017-05-25 DIAGNOSIS — M2550 Pain in unspecified joint: Secondary | ICD-10-CM | POA: Diagnosis not present

## 2017-05-25 DIAGNOSIS — E108 Type 1 diabetes mellitus with unspecified complications: Secondary | ICD-10-CM | POA: Diagnosis not present

## 2017-05-25 DIAGNOSIS — E1059 Type 1 diabetes mellitus with other circulatory complications: Secondary | ICD-10-CM | POA: Diagnosis not present

## 2017-05-25 DIAGNOSIS — Z79899 Other long term (current) drug therapy: Secondary | ICD-10-CM | POA: Diagnosis not present

## 2017-05-26 DIAGNOSIS — N2581 Secondary hyperparathyroidism of renal origin: Secondary | ICD-10-CM | POA: Diagnosis not present

## 2017-05-26 DIAGNOSIS — D509 Iron deficiency anemia, unspecified: Secondary | ICD-10-CM | POA: Diagnosis not present

## 2017-05-26 DIAGNOSIS — T82818A Embolism of vascular prosthetic devices, implants and grafts, initial encounter: Secondary | ICD-10-CM | POA: Diagnosis not present

## 2017-05-26 DIAGNOSIS — Z992 Dependence on renal dialysis: Secondary | ICD-10-CM | POA: Diagnosis not present

## 2017-05-26 DIAGNOSIS — E1022 Type 1 diabetes mellitus with diabetic chronic kidney disease: Secondary | ICD-10-CM | POA: Diagnosis not present

## 2017-05-26 DIAGNOSIS — N186 End stage renal disease: Secondary | ICD-10-CM | POA: Diagnosis not present

## 2017-05-26 DIAGNOSIS — T8612 Kidney transplant failure: Secondary | ICD-10-CM | POA: Diagnosis not present

## 2017-05-26 DIAGNOSIS — D631 Anemia in chronic kidney disease: Secondary | ICD-10-CM | POA: Diagnosis not present

## 2017-05-29 DIAGNOSIS — E1022 Type 1 diabetes mellitus with diabetic chronic kidney disease: Secondary | ICD-10-CM | POA: Diagnosis not present

## 2017-05-29 DIAGNOSIS — N2581 Secondary hyperparathyroidism of renal origin: Secondary | ICD-10-CM | POA: Diagnosis not present

## 2017-05-29 DIAGNOSIS — D509 Iron deficiency anemia, unspecified: Secondary | ICD-10-CM | POA: Diagnosis not present

## 2017-05-29 DIAGNOSIS — N186 End stage renal disease: Secondary | ICD-10-CM | POA: Diagnosis not present

## 2017-05-29 DIAGNOSIS — T82818A Embolism of vascular prosthetic devices, implants and grafts, initial encounter: Secondary | ICD-10-CM | POA: Diagnosis not present

## 2017-05-29 DIAGNOSIS — D631 Anemia in chronic kidney disease: Secondary | ICD-10-CM | POA: Diagnosis not present

## 2017-05-31 DIAGNOSIS — E1022 Type 1 diabetes mellitus with diabetic chronic kidney disease: Secondary | ICD-10-CM | POA: Diagnosis not present

## 2017-05-31 DIAGNOSIS — D509 Iron deficiency anemia, unspecified: Secondary | ICD-10-CM | POA: Diagnosis not present

## 2017-05-31 DIAGNOSIS — N186 End stage renal disease: Secondary | ICD-10-CM | POA: Diagnosis not present

## 2017-05-31 DIAGNOSIS — T82818A Embolism of vascular prosthetic devices, implants and grafts, initial encounter: Secondary | ICD-10-CM | POA: Diagnosis not present

## 2017-05-31 DIAGNOSIS — D631 Anemia in chronic kidney disease: Secondary | ICD-10-CM | POA: Diagnosis not present

## 2017-05-31 DIAGNOSIS — N2581 Secondary hyperparathyroidism of renal origin: Secondary | ICD-10-CM | POA: Diagnosis not present

## 2017-06-02 DIAGNOSIS — T82818A Embolism of vascular prosthetic devices, implants and grafts, initial encounter: Secondary | ICD-10-CM | POA: Diagnosis not present

## 2017-06-02 DIAGNOSIS — N2581 Secondary hyperparathyroidism of renal origin: Secondary | ICD-10-CM | POA: Diagnosis not present

## 2017-06-02 DIAGNOSIS — E1022 Type 1 diabetes mellitus with diabetic chronic kidney disease: Secondary | ICD-10-CM | POA: Diagnosis not present

## 2017-06-02 DIAGNOSIS — N186 End stage renal disease: Secondary | ICD-10-CM | POA: Diagnosis not present

## 2017-06-02 DIAGNOSIS — D631 Anemia in chronic kidney disease: Secondary | ICD-10-CM | POA: Diagnosis not present

## 2017-06-02 DIAGNOSIS — D509 Iron deficiency anemia, unspecified: Secondary | ICD-10-CM | POA: Diagnosis not present

## 2017-06-05 DIAGNOSIS — T82818A Embolism of vascular prosthetic devices, implants and grafts, initial encounter: Secondary | ICD-10-CM | POA: Diagnosis not present

## 2017-06-05 DIAGNOSIS — N2581 Secondary hyperparathyroidism of renal origin: Secondary | ICD-10-CM | POA: Diagnosis not present

## 2017-06-05 DIAGNOSIS — E1022 Type 1 diabetes mellitus with diabetic chronic kidney disease: Secondary | ICD-10-CM | POA: Diagnosis not present

## 2017-06-05 DIAGNOSIS — D509 Iron deficiency anemia, unspecified: Secondary | ICD-10-CM | POA: Diagnosis not present

## 2017-06-05 DIAGNOSIS — D631 Anemia in chronic kidney disease: Secondary | ICD-10-CM | POA: Diagnosis not present

## 2017-06-05 DIAGNOSIS — N186 End stage renal disease: Secondary | ICD-10-CM | POA: Diagnosis not present

## 2017-06-06 DIAGNOSIS — I872 Venous insufficiency (chronic) (peripheral): Secondary | ICD-10-CM | POA: Diagnosis not present

## 2017-06-06 DIAGNOSIS — S90424A Blister (nonthermal), right lesser toe(s), initial encounter: Secondary | ICD-10-CM | POA: Diagnosis not present

## 2017-06-06 DIAGNOSIS — L97909 Non-pressure chronic ulcer of unspecified part of unspecified lower leg with unspecified severity: Secondary | ICD-10-CM | POA: Diagnosis not present

## 2017-06-07 DIAGNOSIS — N2581 Secondary hyperparathyroidism of renal origin: Secondary | ICD-10-CM | POA: Diagnosis not present

## 2017-06-07 DIAGNOSIS — N186 End stage renal disease: Secondary | ICD-10-CM | POA: Diagnosis not present

## 2017-06-07 DIAGNOSIS — D631 Anemia in chronic kidney disease: Secondary | ICD-10-CM | POA: Diagnosis not present

## 2017-06-07 DIAGNOSIS — E1022 Type 1 diabetes mellitus with diabetic chronic kidney disease: Secondary | ICD-10-CM | POA: Diagnosis not present

## 2017-06-07 DIAGNOSIS — T82818A Embolism of vascular prosthetic devices, implants and grafts, initial encounter: Secondary | ICD-10-CM | POA: Diagnosis not present

## 2017-06-07 DIAGNOSIS — D509 Iron deficiency anemia, unspecified: Secondary | ICD-10-CM | POA: Diagnosis not present

## 2017-06-08 DIAGNOSIS — E039 Hypothyroidism, unspecified: Secondary | ICD-10-CM | POA: Diagnosis not present

## 2017-06-08 DIAGNOSIS — E782 Mixed hyperlipidemia: Secondary | ICD-10-CM | POA: Diagnosis not present

## 2017-06-08 DIAGNOSIS — E103593 Type 1 diabetes mellitus with proliferative diabetic retinopathy without macular edema, bilateral: Secondary | ICD-10-CM | POA: Diagnosis not present

## 2017-06-08 DIAGNOSIS — E291 Testicular hypofunction: Secondary | ICD-10-CM | POA: Diagnosis not present

## 2017-06-09 DIAGNOSIS — D631 Anemia in chronic kidney disease: Secondary | ICD-10-CM | POA: Diagnosis not present

## 2017-06-09 DIAGNOSIS — D509 Iron deficiency anemia, unspecified: Secondary | ICD-10-CM | POA: Diagnosis not present

## 2017-06-09 DIAGNOSIS — E1022 Type 1 diabetes mellitus with diabetic chronic kidney disease: Secondary | ICD-10-CM | POA: Diagnosis not present

## 2017-06-09 DIAGNOSIS — N186 End stage renal disease: Secondary | ICD-10-CM | POA: Diagnosis not present

## 2017-06-09 DIAGNOSIS — N2581 Secondary hyperparathyroidism of renal origin: Secondary | ICD-10-CM | POA: Diagnosis not present

## 2017-06-09 DIAGNOSIS — T82818A Embolism of vascular prosthetic devices, implants and grafts, initial encounter: Secondary | ICD-10-CM | POA: Diagnosis not present

## 2017-06-12 DIAGNOSIS — D631 Anemia in chronic kidney disease: Secondary | ICD-10-CM | POA: Diagnosis not present

## 2017-06-12 DIAGNOSIS — T82818A Embolism of vascular prosthetic devices, implants and grafts, initial encounter: Secondary | ICD-10-CM | POA: Diagnosis not present

## 2017-06-12 DIAGNOSIS — N2581 Secondary hyperparathyroidism of renal origin: Secondary | ICD-10-CM | POA: Diagnosis not present

## 2017-06-12 DIAGNOSIS — D509 Iron deficiency anemia, unspecified: Secondary | ICD-10-CM | POA: Diagnosis not present

## 2017-06-12 DIAGNOSIS — E1022 Type 1 diabetes mellitus with diabetic chronic kidney disease: Secondary | ICD-10-CM | POA: Diagnosis not present

## 2017-06-12 DIAGNOSIS — N186 End stage renal disease: Secondary | ICD-10-CM | POA: Diagnosis not present

## 2017-06-13 DIAGNOSIS — L97519 Non-pressure chronic ulcer of other part of right foot with unspecified severity: Secondary | ICD-10-CM | POA: Diagnosis not present

## 2017-06-13 DIAGNOSIS — I872 Venous insufficiency (chronic) (peripheral): Secondary | ICD-10-CM | POA: Diagnosis not present

## 2017-06-13 DIAGNOSIS — E10621 Type 1 diabetes mellitus with foot ulcer: Secondary | ICD-10-CM | POA: Diagnosis not present

## 2017-06-14 DIAGNOSIS — D509 Iron deficiency anemia, unspecified: Secondary | ICD-10-CM | POA: Diagnosis not present

## 2017-06-14 DIAGNOSIS — T82818A Embolism of vascular prosthetic devices, implants and grafts, initial encounter: Secondary | ICD-10-CM | POA: Diagnosis not present

## 2017-06-14 DIAGNOSIS — N186 End stage renal disease: Secondary | ICD-10-CM | POA: Diagnosis not present

## 2017-06-14 DIAGNOSIS — N2581 Secondary hyperparathyroidism of renal origin: Secondary | ICD-10-CM | POA: Diagnosis not present

## 2017-06-14 DIAGNOSIS — E1022 Type 1 diabetes mellitus with diabetic chronic kidney disease: Secondary | ICD-10-CM | POA: Diagnosis not present

## 2017-06-14 DIAGNOSIS — D631 Anemia in chronic kidney disease: Secondary | ICD-10-CM | POA: Diagnosis not present

## 2017-06-16 DIAGNOSIS — E1022 Type 1 diabetes mellitus with diabetic chronic kidney disease: Secondary | ICD-10-CM | POA: Diagnosis not present

## 2017-06-16 DIAGNOSIS — D631 Anemia in chronic kidney disease: Secondary | ICD-10-CM | POA: Diagnosis not present

## 2017-06-16 DIAGNOSIS — D509 Iron deficiency anemia, unspecified: Secondary | ICD-10-CM | POA: Diagnosis not present

## 2017-06-16 DIAGNOSIS — N186 End stage renal disease: Secondary | ICD-10-CM | POA: Diagnosis not present

## 2017-06-16 DIAGNOSIS — N2581 Secondary hyperparathyroidism of renal origin: Secondary | ICD-10-CM | POA: Diagnosis not present

## 2017-06-16 DIAGNOSIS — T82818A Embolism of vascular prosthetic devices, implants and grafts, initial encounter: Secondary | ICD-10-CM | POA: Diagnosis not present

## 2017-06-19 DIAGNOSIS — N2581 Secondary hyperparathyroidism of renal origin: Secondary | ICD-10-CM | POA: Diagnosis not present

## 2017-06-19 DIAGNOSIS — D631 Anemia in chronic kidney disease: Secondary | ICD-10-CM | POA: Diagnosis not present

## 2017-06-19 DIAGNOSIS — N186 End stage renal disease: Secondary | ICD-10-CM | POA: Diagnosis not present

## 2017-06-19 DIAGNOSIS — E1022 Type 1 diabetes mellitus with diabetic chronic kidney disease: Secondary | ICD-10-CM | POA: Diagnosis not present

## 2017-06-19 DIAGNOSIS — D509 Iron deficiency anemia, unspecified: Secondary | ICD-10-CM | POA: Diagnosis not present

## 2017-06-19 DIAGNOSIS — T82818A Embolism of vascular prosthetic devices, implants and grafts, initial encounter: Secondary | ICD-10-CM | POA: Diagnosis not present

## 2017-06-20 DIAGNOSIS — I872 Venous insufficiency (chronic) (peripheral): Secondary | ICD-10-CM | POA: Diagnosis not present

## 2017-06-21 DIAGNOSIS — D631 Anemia in chronic kidney disease: Secondary | ICD-10-CM | POA: Diagnosis not present

## 2017-06-21 DIAGNOSIS — D509 Iron deficiency anemia, unspecified: Secondary | ICD-10-CM | POA: Diagnosis not present

## 2017-06-21 DIAGNOSIS — N186 End stage renal disease: Secondary | ICD-10-CM | POA: Diagnosis not present

## 2017-06-21 DIAGNOSIS — N2581 Secondary hyperparathyroidism of renal origin: Secondary | ICD-10-CM | POA: Diagnosis not present

## 2017-06-21 DIAGNOSIS — T82818A Embolism of vascular prosthetic devices, implants and grafts, initial encounter: Secondary | ICD-10-CM | POA: Diagnosis not present

## 2017-06-21 DIAGNOSIS — E1022 Type 1 diabetes mellitus with diabetic chronic kidney disease: Secondary | ICD-10-CM | POA: Diagnosis not present

## 2017-06-22 ENCOUNTER — Encounter: Payer: Self-pay | Admitting: Adult Health

## 2017-06-22 ENCOUNTER — Ambulatory Visit: Payer: Medicare Other | Admitting: Adult Health

## 2017-06-22 VITALS — BP 135/70 | HR 76 | Ht 68.0 in | Wt 290.0 lb

## 2017-06-22 DIAGNOSIS — G4733 Obstructive sleep apnea (adult) (pediatric): Secondary | ICD-10-CM | POA: Diagnosis not present

## 2017-06-22 NOTE — Patient Instructions (Addendum)
Set up for CPAP titration study.  Follow up in 2 months with Dr. Halford Chessman  In [facility]

## 2017-06-22 NOTE — Assessment & Plan Note (Signed)
Severe OSA - needs to get back on BIPAP  Will set up for a CPAP titration , if needs BIPAP may be able to use nasal mask to see if this will be more tolerable   Plan  . Patient Instructions  Set up for CPAP titration study.  Follow up in 2 months with Dr. Halford Chessman  In [facility]

## 2017-06-22 NOTE — Progress Notes (Signed)
_0  ID: Dale [name], Dale [name]    DOB: 1966-02-19, 52 y.o.   MRN: [id number]  Chief Complaint  Patient presents with  . Follow-up    OSA     Referring provider: Mercy Riding, MD  HPI: 52 yo Dale [name] with known DM and ESRD on Dialysis . Seen for pulmonary consult during hospitalization in 12/2015 for acute resp failure and  post op stridor found to have tracheomalacia on CT neck.   TEST  12/2015 >CT neck , tracheomalacia , highly atelectatic appearance of trachea.  01/29/16 HRCT Chest >very mild patchy GG attenuation, favored for mild interstitial edema. Severe tracheobronchomalacia.   Sleep study shows : Sleep study 03/02/2016 >Severe OSA (This study showed severe obstructive sleep apnea with an AH of 76 and SaO2 low of 56%. -  >He should be started on trial of BiPAP therapy on 23/19 cm H2O.  06/22/2017 Follow up : OSA  She returns for a follow-up.  Patient was seen previously in 2017 for suspected sleep apnea.  He was set up for a sleep study done on November eighth 2017.  This showed severe sleep apnea with an AHI at 76, SaO2 low at 56%.  He was recommended to begin on BiPAP.  Patient says he did get his BiPAP machine but says he could not tolerate this.  He has severe night terrors claustrophobia.  He ended up returning this back to the DME company.  He did not follow-up since 2017.  Patient is on dialysis.  He is in a wheelchair with previous left leg amputation with prosthesis.  Says it is hard for him to get to appointments.  She has chronic pain and is on daily narcotics.. We discussed obstructive sleep apnea and potential complications of untreated sleep apnea.  Patient says that he does have daytime sleepiness snoring and restless sleep.  Feels that he needs to get back on a sleep machine but is unsure if he is able to wear one or not Patient is on home oxygen.  Bedtime  Allergies  Allergen Reactions  . Prednisone Other (See Comments)    Increases sugar levels  . Adhesive [Tape]  Itching and Other (See Comments)    Blisters on arm from adhesive tape at dialysis   . Amoxicillin Itching and Rash    Has patient had a PCN reaction causing immediate rash, facial/tongue/throat swelling, SOB or lightheadedness with hypotension: Yes Has patient had a PCN reaction causing severe rash involving mucus membranes or skin necrosis: No Has patient had a PCN reaction that required hospitalization No Has patient had a PCN reaction occurring within the last 10 years: No If all of the above answers are "NO", then may proceed with Cephalosporin use.  . Clindamycin/Lincomycin Hives and Itching  . Contrast Media [Iodinated Diagnostic Agents] Itching and Rash    Systemic itching and transient rash appearance within hours of receiving contrast    . Enalapril Rash and Cough  . Mobic [Meloxicam] Hives and Rash  . Morphine Hives, Nausea Only and Rash  . Brilinta [Ticagrelor] Nausea And Vomiting  . Ciprofloxacin Itching and Rash  . Clopidogrel Rash  . Codeine Hives, Itching and Rash  . Doxycycline Rash  . Hydrocodone Rash  . Levofloxacin Hives and Itching  . Oxycodone Rash  . Rocephin [Ceftriaxone] Itching    Immunization History  Administered Date(s) Administered  . Influenza,inj,Quad PF,6+ Mos 01/21/2016  . Influenza-Unspecified 02/03/2014, 01/24/2017  . Pneumococcal Polysaccharide-23 04/14/2016    Past Medical History:  Diagnosis Date  . Anal fissure   . Anemia of chronic disease   . Arthritis    "hands, right knee" (03/19/2014) no cartlidge in right knee  . CAD (coronary artery disease)    a. Abnl nuc ->LHC (11/15):  mid to dist Dx 80%, mid RCA 99% (functional CTO), dist RCA 80% >> PCI: balloon angioplasty to mid RCA (could not deliver stent) - staged PCI Rotoblator atherectomy/DES to [facility]. b. NSTEMI 12/2015 s/p DES to diagonal.  . Cholecystitis    a. 08/27/2011  . Chronic diastolic CHF (congestive heart failure) ([place])    a. Echo 3/13:  EF 55-60%;  b. Echo (11/15):  Mild  LVH, EF 60-65%, no RWMA, Gr 1 DD, MAC, mild LAE, normal RVF, mild RAE;  c.  Echo 5/16:  severe LVH, EF 55-60%, no RWMA, Gr 1 DD, MAC, mild LAE  . Claustrophobia    when things get around his face.   . Complication of anesthesia   . Difficult intubation    only once in 2015 at [facility] haven't had a problem since then  . Diverticulosis   . ESRD (end stage renal disease) ([place])    a. 1995 s/p cadaveric transplant w/ susbequent failure after 18 yrs;  b.Dialysis initiated 07/2011 [facility] [id number]  . Fibromyalgia   . Gastroparesis   . GERD (gastroesophageal reflux disease)   . Gout    PMH  . History of blood transfusion   . HLD (hyperlipidemia)   . Hypertension   . Hypothyroidism   . Inguinal lymphadenopathy    a. bilateral - s/p biopsy 07/2011  . Insulin dependent diabetes mellitus ([facility])    Type I  . Monilial esophagitis ([place])   . Morbid obesity ([place])   . OSA (obstructive sleep apnea)    adjustable bed  . PAD (peripheral artery disease) ([facility])    a. s/p L BKA.  [name] Kitchen Pericardial effusion    a.  Small by CT 08/22/11;  b.  Large by CT 08/27/11. c. Not seen on 11/2015 echo.  . Pneumonia ~ 2007  . PONV (postoperative nausea and vomiting)    vomited once after a procedure.  . Tracheobronchomalacia     Tobacco History: Social History   Tobacco Use  Smoking Status Never Smoker  Smokeless Tobacco Never Used   Counseling given: Not Answered   Outpatient Encounter Medications as of 06/22/2017  Medication Sig  . acetaminophen (TYLENOL) 500 MG tablet Take 1,000 mg by mouth every 4 (four) hours as needed for moderate pain or headache.   . allopurinol (ZYLOPRIM) 100 MG tablet TAKE 2 TABLETS BY MOUTH EVERY DAY  . aspirin EC 81 MG tablet Take 81 mg by mouth at bedtime.  [name] Kitchen atorvastatin (LIPITOR) 40 MG tablet TAKE 1 TABLET(40 MG) BY MOUTH DAILY  . calcium acetate (PHOSLO) 667 MG capsule Take 667-1,334 mg by mouth 3 (three) times daily with meals. Take 1322ms with meals and 6663m with snacks    . docusate sodium (COLACE) 100 MG capsule Take 200 mg by mouth 2 (two) times daily.   . fenofibrate (TRICOR) 48 MG tablet Take 48 mg by mouth daily.   . fluticasone (FLONASE) 50 MCG/ACT nasal spray Place 2 sprays into both nostrils daily as needed for allergies.   . [name] KitchenOSRENOL 1000 MG PACK Take 1 Package by mouth 3 (three) times daily.   . [name] Kitchenabapentin (NEURONTIN) 300 MG capsule Take 300 mg by mouth 3 (three) times daily.  . [name] KitchenLUCAGON EMERGENCY 1 MG injection Inject 1  mg into the muscle daily as needed (low blood sugar).   [name] Kitchen HYDROmorphone (DILAUDID) 2 MG tablet Take 2 mg by mouth 3 (three) times daily as needed for moderate pain or severe pain.  . hydrOXYzine (ATARAX/VISTARIL) 10 MG tablet Take 10 mg by mouth every 8 (eight) hours as needed for anxiety or vomiting.   . insulin lispro (HUMALOG) 100 UNIT/ML injection Inject 1-20 Units into the skin 3 (three) times daily. Blood glucose - 120, 1 unit for every 20 points over 120   . LANTUS SOLOSTAR 100 UNIT/ML Solostar Pen   . levothyroxine (SYNTHROID, LEVOTHROID) 112 MCG tablet Take 112 mcg by mouth every evening. 2 hours after dinner  . loratadine (CLARITIN) 10 MG tablet Take 10 mg by mouth daily.  . metoprolol tartrate (LOPRESSOR) 25 MG tablet Take 1 tablet (25 mg total) by mouth 2 (two) times daily.  . midodrine (PROAMATINE) 10 MG tablet Take 1 tablet (10 mg total) by mouth 3 (three) times daily. Takes 1 tab in mornings of dialysis and takes 2 tabs 30 mins before starting dialysis (m,w,f)  . multivitamin (RENA-VIT) TABS tablet Take 1 tablet by mouth daily.  . mupirocin ointment (BACTROBAN) 2 % Place 1 application into the nose 2 (two) times daily.  . nitroGLYCERIN (NITROSTAT) 0.4 MG SL tablet Place 1 tablet (0.4 mg total) under the tongue every 5 (five) minutes as needed for chest pain.  . Nutritional Supplements (FEEDING SUPPLEMENT, NEPRO CARB STEADY,) LIQD Take 237 mLs by mouth daily.  [name] Kitchen omeprazole (PRILOSEC OTC) 20 MG tablet Take 20 mg by mouth  daily.  . OXYGEN Inhale 3 L into the lungs at bedtime.  Vladimir Faster Glycol-Propyl Glycol (SYSTANE) 0.4-0.3 % SOLN Place 1 drop into both eyes as needed (dry eyes).  . polyethylene glycol (MIRALAX / GLYCOLAX) packet Take 17 g by mouth daily. (Patient taking differently: Take 17 g by mouth daily as needed for mild constipation. )  . prasugrel (EFFIENT) 10 MG TABS tablet TAKE ONE TABLET BY MOUTH EVERY DAY  . promethazine (PHENERGAN) 25 MG tablet Take 1 tablet (25 mg total) by mouth every 6 (six) hours as needed for nausea or vomiting. For nausea  . rOPINIRole (REQUIP) 2 MG tablet TAKE 1 TABLET(2 MG) BY MOUTH EVERY NIGHT  . testosterone cypionate (DEPOTESTOSTERONE CYPIONATE) 200 MG/ML injection INJECT 1 ML INTO THE MUSCLE Q 14 DAYS  . VELPHORO 500 MG chewable tablet   . Wound Cleansers (WOUND WASH) LIQD Apply 1 application topically daily as needed (wound care).   No facility-administered encounter medications on file as of 06/22/2017.      Review of Systems  Constitutional:   No  weight loss, night sweats,  Fevers, chills, fatigue, or  lassitude.  HEENT:   No headaches,  Difficulty swallowing,  Tooth/dental problems, or  Sore throat,                No sneezing, itching, ear ache, nasal congestion, post nasal drip,   CV:  No chest pain,  Orthopnea, PND, swelling in lower extremities, anasarca, dizziness, palpitations, syncope.   GI  No heartburn, indigestion, abdominal pain, nausea, vomiting, diarrhea, change in bowel habits, loss of appetite, bloody stools.   Resp: No shortness of breath with exertion or at rest.  No excess mucus, no productive cough,  No non-productive cough,  No coughing up of blood.  No change in color of mucus.  No wheezing.  No chest wall deformity  Skin: no rash or lesions.  GU: no dysuria,  change in color of urine, no urgency or frequency.  No flank pain, no hematuria   MS:  No joint pain or swelling.  No decreased range of motion.  No back pain.    Physical  Exam  BP 135/70 (BP Location: Left Arm, Cuff Size: Normal)   Pulse 76   Ht _0  (1.727 m)   Wt 290 lb (131.5 kg)   SpO2 95%   BMI 44.09 kg/m   GEN: A/Ox3; pleasant , NAD, obese    HEENT:  [place]/AT,  EACs-clear, TMs-wnl, NOSE-clear, THROAT-clear, no lesions, no postnasal drip or exudate noted. Class 3 MP airway   NECK:  Supple w/ fair ROM; no JVD; normal carotid impulses w/o bruits; no thyromegaly or nodules palpated; no lymphadenopathy.    RESP  Clear  P & A; w/o, wheezes/ rales/ or rhonchi. no accessory muscle use, no dullness to percussion  CARD:  RRR, no m/r/g, no peripheral edema, pulses intact, no cyanosis or clubbing.  GI:   Soft & nt; nml bowel sounds; no organomegaly or masses detected.   Musco: Warm bil, L BKA , prosthesis   Neuro: alert, no focal deficits noted.    Skin: Warm, no lesions or rashes    Lab Results:  CBC  ProBNP No results found for: PROBNP  Imaging: No results found.   Assessment & Plan:   OSA (obstructive sleep apnea) Severe OSA - needs to get back on BIPAP  Will set up for a CPAP titration , if needs BIPAP may be able to use nasal mask to see if this will be more tolerable   Plan  . Patient Instructions  Set up for CPAP titration study.  Follow up in 2 months with Dr. Halford Chessman  In [facility]          [place], [place] 06/22/2017

## 2017-06-23 DIAGNOSIS — N2581 Secondary hyperparathyroidism of renal origin: Secondary | ICD-10-CM | POA: Diagnosis not present

## 2017-06-23 DIAGNOSIS — E1022 Type 1 diabetes mellitus with diabetic chronic kidney disease: Secondary | ICD-10-CM | POA: Diagnosis not present

## 2017-06-23 DIAGNOSIS — R509 Fever, unspecified: Secondary | ICD-10-CM | POA: Diagnosis not present

## 2017-06-23 DIAGNOSIS — N186 End stage renal disease: Secondary | ICD-10-CM | POA: Diagnosis not present

## 2017-06-23 DIAGNOSIS — D509 Iron deficiency anemia, unspecified: Secondary | ICD-10-CM | POA: Diagnosis not present

## 2017-06-23 DIAGNOSIS — T82818A Embolism of vascular prosthetic devices, implants and grafts, initial encounter: Secondary | ICD-10-CM | POA: Diagnosis not present

## 2017-06-23 DIAGNOSIS — D631 Anemia in chronic kidney disease: Secondary | ICD-10-CM | POA: Diagnosis not present

## 2017-06-23 NOTE — Progress Notes (Signed)
Reviewed and agree with assessment/plan.   Laury Huizar, MD [facility] 04/20/2016, 12:24 PM Pager:  [phone]  

## 2017-06-26 DIAGNOSIS — E1022 Type 1 diabetes mellitus with diabetic chronic kidney disease: Secondary | ICD-10-CM | POA: Diagnosis not present

## 2017-06-26 DIAGNOSIS — D631 Anemia in chronic kidney disease: Secondary | ICD-10-CM | POA: Diagnosis not present

## 2017-06-26 DIAGNOSIS — D509 Iron deficiency anemia, unspecified: Secondary | ICD-10-CM | POA: Diagnosis not present

## 2017-06-26 DIAGNOSIS — R509 Fever, unspecified: Secondary | ICD-10-CM | POA: Diagnosis not present

## 2017-06-26 DIAGNOSIS — N186 End stage renal disease: Secondary | ICD-10-CM | POA: Diagnosis not present

## 2017-06-26 DIAGNOSIS — N2581 Secondary hyperparathyroidism of renal origin: Secondary | ICD-10-CM | POA: Diagnosis not present

## 2017-06-26 DIAGNOSIS — T82818A Embolism of vascular prosthetic devices, implants and grafts, initial encounter: Secondary | ICD-10-CM | POA: Diagnosis not present

## 2017-06-27 DIAGNOSIS — M1711 Unilateral primary osteoarthritis, right knee: Secondary | ICD-10-CM | POA: Diagnosis not present

## 2017-06-28 DIAGNOSIS — N2581 Secondary hyperparathyroidism of renal origin: Secondary | ICD-10-CM | POA: Diagnosis not present

## 2017-06-28 DIAGNOSIS — D631 Anemia in chronic kidney disease: Secondary | ICD-10-CM | POA: Diagnosis not present

## 2017-06-28 DIAGNOSIS — T82818A Embolism of vascular prosthetic devices, implants and grafts, initial encounter: Secondary | ICD-10-CM | POA: Diagnosis not present

## 2017-06-28 DIAGNOSIS — D509 Iron deficiency anemia, unspecified: Secondary | ICD-10-CM | POA: Diagnosis not present

## 2017-06-28 DIAGNOSIS — N186 End stage renal disease: Secondary | ICD-10-CM | POA: Diagnosis not present

## 2017-06-28 DIAGNOSIS — R509 Fever, unspecified: Secondary | ICD-10-CM | POA: Diagnosis not present

## 2017-06-28 DIAGNOSIS — E1022 Type 1 diabetes mellitus with diabetic chronic kidney disease: Secondary | ICD-10-CM | POA: Diagnosis not present

## 2017-06-30 DIAGNOSIS — E1022 Type 1 diabetes mellitus with diabetic chronic kidney disease: Secondary | ICD-10-CM | POA: Diagnosis not present

## 2017-06-30 DIAGNOSIS — D509 Iron deficiency anemia, unspecified: Secondary | ICD-10-CM | POA: Diagnosis not present

## 2017-06-30 DIAGNOSIS — D631 Anemia in chronic kidney disease: Secondary | ICD-10-CM | POA: Diagnosis not present

## 2017-06-30 DIAGNOSIS — R509 Fever, unspecified: Secondary | ICD-10-CM | POA: Diagnosis not present

## 2017-06-30 DIAGNOSIS — N186 End stage renal disease: Secondary | ICD-10-CM | POA: Diagnosis not present

## 2017-06-30 DIAGNOSIS — T82818A Embolism of vascular prosthetic devices, implants and grafts, initial encounter: Secondary | ICD-10-CM | POA: Diagnosis not present

## 2017-06-30 DIAGNOSIS — N2581 Secondary hyperparathyroidism of renal origin: Secondary | ICD-10-CM | POA: Diagnosis not present

## 2017-07-03 DIAGNOSIS — E1022 Type 1 diabetes mellitus with diabetic chronic kidney disease: Secondary | ICD-10-CM | POA: Diagnosis not present

## 2017-07-03 DIAGNOSIS — N2581 Secondary hyperparathyroidism of renal origin: Secondary | ICD-10-CM | POA: Diagnosis not present

## 2017-07-03 DIAGNOSIS — N186 End stage renal disease: Secondary | ICD-10-CM | POA: Diagnosis not present

## 2017-07-03 DIAGNOSIS — D631 Anemia in chronic kidney disease: Secondary | ICD-10-CM | POA: Diagnosis not present

## 2017-07-03 DIAGNOSIS — T82818A Embolism of vascular prosthetic devices, implants and grafts, initial encounter: Secondary | ICD-10-CM | POA: Diagnosis not present

## 2017-07-03 DIAGNOSIS — D509 Iron deficiency anemia, unspecified: Secondary | ICD-10-CM | POA: Diagnosis not present

## 2017-07-03 DIAGNOSIS — R509 Fever, unspecified: Secondary | ICD-10-CM | POA: Diagnosis not present

## 2017-07-05 DIAGNOSIS — D631 Anemia in chronic kidney disease: Secondary | ICD-10-CM | POA: Diagnosis not present

## 2017-07-05 DIAGNOSIS — R509 Fever, unspecified: Secondary | ICD-10-CM | POA: Diagnosis not present

## 2017-07-05 DIAGNOSIS — N2581 Secondary hyperparathyroidism of renal origin: Secondary | ICD-10-CM | POA: Diagnosis not present

## 2017-07-05 DIAGNOSIS — T82818A Embolism of vascular prosthetic devices, implants and grafts, initial encounter: Secondary | ICD-10-CM | POA: Diagnosis not present

## 2017-07-05 DIAGNOSIS — D509 Iron deficiency anemia, unspecified: Secondary | ICD-10-CM | POA: Diagnosis not present

## 2017-07-05 DIAGNOSIS — N186 End stage renal disease: Secondary | ICD-10-CM | POA: Diagnosis not present

## 2017-07-05 DIAGNOSIS — E1022 Type 1 diabetes mellitus with diabetic chronic kidney disease: Secondary | ICD-10-CM | POA: Diagnosis not present

## 2017-07-06 ENCOUNTER — Ambulatory Visit: Payer: Medicare Other | Admitting: Cardiovascular Disease

## 2017-07-06 ENCOUNTER — Encounter: Payer: Self-pay | Admitting: Cardiovascular Disease

## 2017-07-06 VITALS — BP 126/80 | HR 71 | Ht 68.0 in | Wt 293.0 lb

## 2017-07-06 DIAGNOSIS — I2510 Atherosclerotic heart disease of native coronary artery without angina pectoris: Secondary | ICD-10-CM

## 2017-07-06 NOTE — Patient Instructions (Signed)
Medication Instructions:  Your physician recommends that you continue on your current medications as directed. Please refer to the Current Medication list given to you today.   Labwork: none  Testing/Procedures: none  Follow-Up: Your physician recommends that you schedule a follow-up appointment in: 6 months.  Please call our office in about 3 months to schedule this appointment.     Any Other Special Instructions Will Be Listed Below (If Applicable).     If you need a refill on your cardiac medications before your next appointment, please call your pharmacy.   

## 2017-07-06 NOTE — Progress Notes (Signed)
Chief Complaint  Patient presents with  . Follow-up    CAD     History of Present Illness: 52 yo male with history of CAD, DM, HTN, OSA, morbid obesity, ESRD on HD, fibromyalgia, chronic anemia, PAD status post left BKA, venous insufficiency who is here today for cardiac follow up. CAD diagnosed in November 2015. Cardiac cath on 03/19/14 with severe calcified RCA stenosis that was treated with angioplasty and staged rotational atherectomy with placement of a drug eluting stent. The distal RCA lesion was treated with balloon angioplasty alone. Possible allergic reaction to Plavix but did well with Effient. He developed a left groin AV fistula following his cath.  Admitted September 2017 with left arm pain, NSTEMI. Repeat cath in September 2017 with severe disease in the diagonal branch treated with a drug eluting stent. He was discharged on ASA and Brilinta but due to nausea, he was changed to Effient. He has had some chest pain and back pain when his BP is low. He has been on midodrine on dialysis days. Last echo July 2018 with normal LV systolic function, AOZH=08-65%, grade 1 diastolic dysfunction, moderate LVH, mild TR, trivial MR.   He is here today for follow up. The patient denies any chest pain, dyspnea, palpitations, lower extremity edema, orthopnea, PND, dizziness, near syncope or syncope.  He has dyspnea with exertion but this is unchanged.   Primary care: Mercy Riding, MD  Past Medical History:  Diagnosis Date  . Anal fissure   . Anemia of chronic disease   . Arthritis    "hands, right knee" (03/19/2014) no cartlidge in right knee  . CAD (coronary artery disease)    a. Abnl nuc ->LHC (11/15):  mid to dist Dx 80%, mid RCA 99% (functional CTO), dist RCA 80% >> PCI: balloon angioplasty to mid RCA (could not deliver stent) - staged PCI Rotoblator atherectomy/DES to [facility]. b. NSTEMI 12/2015 s/p DES to diagonal.  . Cholecystitis    a. 08/27/2011  . Chronic diastolic CHF (congestive heart  failure) ([place])    a. Echo 3/13:  EF 55-60%;  b. Echo (11/15):  Mild LVH, EF 60-65%, no RWMA, Gr 1 DD, MAC, mild LAE, normal RVF, mild RAE;  c.  Echo 5/16:  severe LVH, EF 55-60%, no RWMA, Gr 1 DD, MAC, mild LAE  . Claustrophobia    when things get around his face.   . Complication of anesthesia   . Difficult intubation    only once in 2015 at [facility] haven't had a problem since then  . Diverticulosis   . ESRD (end stage renal disease) ([place])    a. 1995 s/p cadaveric transplant w/ susbequent failure after 18 yrs;  b.Dialysis initiated 07/2011 [facility] M-W-F  . Fibromyalgia   . Gastroparesis   . GERD (gastroesophageal reflux disease)   . Gout    PMH  . History of blood transfusion   . HLD (hyperlipidemia)   . Hypertension   . Hypothyroidism   . Inguinal lymphadenopathy    a. bilateral - s/p biopsy 07/2011  . Insulin dependent diabetes mellitus (HCC)    Type I  . Monilial esophagitis ([place])   . Morbid obesity ([place])   . OSA (obstructive sleep apnea)    adjustable bed  . PAD (peripheral artery disease) (HCC)    a. s/p L BKA.  [name] Kitchen Pericardial effusion    a.  Small by CT 08/22/11;  b.  Large by CT 08/27/11. c. Not seen on 11/2015  echo.  . Pneumonia ~ 2007  . PONV (postoperative nausea and vomiting)    vomited once after a procedure.  . Tracheobronchomalacia     Past Surgical History:  Procedure Laterality Date  . A/V FISTULAGRAM Left 07/26/2016   Procedure: A/V Fistulagram;  Surgeon: Waynetta Sandy, MD;  Location: [place] CV LAB;  Service: Cardiovascular;  Laterality: Left;  . AMPUTATION OF REPLICATED TOES Right   . ANGIOPLASTY  04/09/2014   RCA   . APPENDECTOMY  ~ 2006  . ARTERIOVENOUS GRAFT PLACEMENT Left 1993?   forearm  . ARTERIOVENOUS GRAFT PLACEMENT Right 1993?   leg  . ARTERIOVENOUS GRAFT PLACEMENT Left    Thigh  . Arteriovenous Graft Removed Left    Thigh  . AV FISTULA PLACEMENT  07/29/2011   Procedure: ARTERIOVENOUS (AV) FISTULA CREATION;  Surgeon: Mal Misty, MD;  Location: [place];  Service: Vascular;  Laterality: Right;  Brachial cephalic  . AV FISTULA PLACEMENT Left 01/20/2015   Procedure: LIGATION OF RIGHT ARTERIOVENOUS FISTULA WITH EXCISION OF ANEURYSM;  Surgeon: Angelia Mould, MD;  Location: [place];  Service: Vascular;  Laterality: Left;  . AV FISTULA PLACEMENT Left 04/30/2015   Procedure: CREATION OF LEFT ARM BRACHIO-CEPHALIC ARTERIOVENOUS (AV) FISTULA ;  Surgeon: Angelia Mould, MD;  Location: [place];  Service: Vascular;  Laterality: Left;  . AV Graft PLACEMENT Left 1993?   "attempted one in my wrist; didn't take"  . BELOW KNEE LEG AMPUTATION Left 2010  . CARDIAC CATHETERIZATION  03/19/2014  . CARDIAC CATHETERIZATION  03/19/2014   Procedure: CORONARY BALLOON ANGIOPLASTY;  Surgeon: Burnell Blanks, MD;  Location: [facility] CATH LAB;  Service: Cardiovascular;;  . CARDIAC CATHETERIZATION N/A 01/01/2016   Procedure: Left Heart Cath and Coronary Angiography;  Surgeon: Troy Sine, MD;  Location: [place] CV LAB;  Service: Cardiovascular;  Laterality: N/A;  . CARDIAC CATHETERIZATION N/A 01/01/2016   Procedure: Coronary Stent Intervention;  Surgeon: Troy Sine, MD;  Location: [place] CV LAB;  Service: Cardiovascular;  Laterality: N/A;  . CARPAL TUNNEL RELEASE Bilateral after 2005  . CATARACT EXTRACTION W/ INTRAOCULAR LENS  IMPLANT, BILATERAL Bilateral 1990's  . COLONOSCOPY  08/29/2011   Procedure: COLONOSCOPY;  Surgeon: Jerene Bears, MD;  Location: [place];  Service: Gastroenterology;  Laterality: N/A;  . COLONOSCOPY WITH PROPOFOL N/A 04/26/2016   Procedure: COLONOSCOPY WITH PROPOFOL;  Surgeon: Jerene Bears, MD;  Location: [facility]OSCOPY;  Service: Gastroenterology;  Laterality: N/A;  . CORONARY ANGIOPLASTY WITH STENT PLACEMENT  04/09/2014       ptca/des mid lad   . ESOPHAGOGASTRODUODENOSCOPY N/A 12/25/2015   Procedure: ESOPHAGOGASTRODUODENOSCOPY (EGD);  Surgeon: Mauri Pole, MD;  Location: [facility] ENDOSCOPY;  Service:  Endoscopy;  Laterality: N/A;  bedside  . ESOPHAGOGASTRODUODENOSCOPY (EGD) WITH PROPOFOL N/A 04/26/2016   Procedure: ESOPHAGOGASTRODUODENOSCOPY (EGD) WITH PROPOFOL;  Surgeon: Jerene Bears, MD;  Location: [facility]OS[facility];  Service: Gastroenterology;  Laterality: N/A;  . EYE SURGERY Bilateral    cataract  . FEMORAL ARTERY REPAIR  04/09/2014   ANGIOSEAL  . INSERTION OF DIALYSIS CATHETER  08/01/2011   Procedure: INSERTION OF DIALYSIS CATHETER;  Surgeon: Rosetta Posner, MD;  Location: [place];  Service: Vascular;  Laterality: Right;  insertion of dialysis catheter on right internal jugular vein  . INSERTION OF DIALYSIS CATHETER Right 01/20/2015   Procedure: INSERTION OF DIALYSIS CATHETER - RIGHT INTERNAL JUGULAR ;  Surgeon: Angelia Mould, MD;  Location: [place];  Service: Vascular;  Laterality: Right;  .  KIDNEY TRANSPLANT  04/25/1993  . LEFT HEART CATHETERIZATION WITH CORONARY ANGIOGRAM N/A 03/19/2014   Procedure: LEFT HEART CATHETERIZATION WITH CORONARY ANGIOGRAM;  Surgeon: Burnell Blanks, MD;  Location: [facility] CATH LAB;  Service: Cardiovascular;  Laterality: N/A;  . LYMPH NODE BIOPSY  08/10/2011   Procedure: LYMPH NODE BIOPSY;  Surgeon: Merrie Roof, MD;  Location: [place];  Service: General;  Laterality: Left;  left groin lymph biopsy  . NEUROPLASTY / TRANSPOSITION ULNAR NERVE AT ELBOW Left after 2005  . PATCH ANGIOPLASTY Left 08/09/2016   Procedure: LEFT  ARTERIOVENOUS FISTULA PATCH ANGIOPLASTY;  Surgeon: Waynetta Sandy, MD;  Location: [place];  Service: Vascular;  Laterality: Left;  . PERCUTANEOUS CORONARY ROTOBLATOR INTERVENTION (PCI-R) N/A 04/09/2014   Procedure: PERCUTANEOUS CORONARY ROTOBLATOR INTERVENTION (PCI-R);  Surgeon: Burnell Blanks, MD;  Location: [facility] CATH LAB;  Service: Cardiovascular;  Laterality: N/A;  . PERIPHERAL VASCULAR CATHETERIZATION N/A 12/25/2014   Procedure: Upper Extremity Venography;  Surgeon: Conrad Androscoggin, MD;  Location: [place] CV LAB;  Service:  Cardiovascular;  Laterality: N/A;  . PERIPHERAL VASCULAR CATHETERIZATION Left 02/24/2016   Procedure: Fistulagram;  Surgeon: Waynetta Sandy, MD;  Location: [place] CV LAB;  Service: Cardiovascular;  Laterality: Left;  . PERIPHERAL VASCULAR CATHETERIZATION Left 02/24/2016   Procedure: Peripheral Vascular Intervention;  Surgeon: Waynetta Sandy, MD;  Location: [place] CV LAB;  Service: Cardiovascular;  Laterality: Left;  AV FISTULA  . PERITONEAL CATHETER INSERTION    . PERITONEAL CATHETER REMOVAL    . REVISON OF ARTERIOVENOUS FISTULA Right 06/14/2540   Procedure: PLICATION / REVISION OF ARTERIOVENOUS FISTULA;  Surgeon: Angelia Mould, MD;  Location: [place];  Service: Vascular;  Laterality: Right;  . REVISON OF ARTERIOVENOUS FISTULA Left 09/01/2015   Procedure: SUPERFICIALIZATION OF LEFT ARM BRACHIOCEPHALIC ARTERIOVENOUS FISTULA;  Surgeon: Angelia Mould, MD;  Location: [place];  Service: Vascular;  Laterality: Left;  . REVISON OF ARTERIOVENOUS FISTULA Left 08/09/2016   Procedure: REVISION OF ARTERIAL ANASTOMOSIS OF ARTERIOVENOUS FISTULA;  Surgeon: Waynetta Sandy, MD;  Location: [place];  Service: Vascular;  Laterality: Left;  . SHOULDER ARTHROSCOPY Left ~ 2006   frozen  . TOE AMPUTATION Bilateral after 2005  . UMBILICAL HERNIA REPAIR  1990's  . VENOGRAM Right 07/28/2011   Procedure: VENOGRAM;  Surgeon: Conrad Robeline, MD;  Location: [facility] CATH LAB;  Service: Cardiovascular;  Laterality: Right;  . VITRECTOMY Bilateral     Current Outpatient Medications  Medication Sig Dispense Refill  . acetaminophen (TYLENOL) 500 MG tablet Take 1,000 mg by mouth every 4 (four) hours as needed for moderate pain or headache.     . allopurinol (ZYLOPRIM) 100 MG tablet TAKE 2 TABLETS BY MOUTH EVERY DAY 180 tablet 3  . aspirin EC 81 MG tablet Take 81 mg by mouth at bedtime.    [name] Kitchen atorvastatin (LIPITOR) 40 MG tablet TAKE 1 TABLET(40 MG) BY MOUTH DAILY 90 tablet 3  . calcium acetate  (PHOSLO) 667 MG capsule Take 667-1,334 mg by mouth 3 (three) times daily with meals. Take 135ms with meals and 6638m with snacks    . cetirizine (ZYRTEC) 10 MG tablet Take 10 mg by mouth daily.    . [name] Kitchenocusate sodium (COLACE) 100 MG capsule Take 200 mg by mouth 2 (two) times daily.     . fenofibrate (TRICOR) 48 MG tablet Take 48 mg by mouth daily.     . fluticasone (FLONASE) 50 MCG/ACT nasal spray Place 2 sprays into both nostrils daily  as needed for allergies.     [name] Kitchen FOSRENOL 1000 MG PACK Take 1 Package by mouth 3 (three) times daily.   6  . gabapentin (NEURONTIN) 300 MG capsule Take 300 mg by mouth 3 (three) times daily.    [name] Kitchen GLUCAGON EMERGENCY 1 MG injection Inject 1 mg into the muscle daily as needed (low blood sugar).     [name] Kitchen HYDROmorphone (DILAUDID) 2 MG tablet Take 2 mg by mouth 2 (two) times daily.     . hydrOXYzine (ATARAX/VISTARIL) 10 MG tablet Take 10 mg by mouth every 8 (eight) hours as needed for anxiety or vomiting.     . insulin lispro (HUMALOG) 100 UNIT/ML injection Inject 1-20 Units into the skin 3 (three) times daily. Blood glucose - 120, 1 unit for every 20 points over 120 ,  PT RECEIVES FROM INSULIN PUMP    . levothyroxine (SYNTHROID, LEVOTHROID) 112 MCG tablet Take 112 mcg by mouth every evening. 2 hours after dinner    . midodrine (PROAMATINE) 10 MG tablet Take 1 tablet (10 mg total) by mouth 3 (three) times daily. Takes 1 tab in mornings of dialysis and takes 2 tabs 30 mins before starting dialysis (m,w,f)    . multivitamin (RENA-VIT) TABS tablet Take 1 tablet by mouth daily.    . mupirocin ointment (BACTROBAN) 2 % Place 1 application into the nose 2 (two) times daily. 15 g 0  . nitroGLYCERIN (NITROSTAT) 0.4 MG SL tablet Place 1 tablet (0.4 mg total) under the tongue every 5 (five) minutes as needed for chest pain. 25 tablet 4  . Nutritional Supplements (FEEDING SUPPLEMENT, NEPRO CARB STEADY,) LIQD Take 237 mLs by mouth daily. 30 Can 0  . omeprazole (PRILOSEC OTC) 20 MG tablet  Take 20 mg by mouth daily.    . OXYGEN Inhale 3 L into the lungs at bedtime.    Vladimir Faster Glycol-Propyl Glycol (SYSTANE) 0.4-0.3 % SOLN Place 1 drop into both eyes as needed (dry eyes).    . polyethylene glycol (MIRALAX / GLYCOLAX) packet Take 17 g by mouth daily. (Patient taking differently: Take 17 g by mouth daily as needed for mild constipation. ) 14 each 0  . prasugrel (EFFIENT) 10 MG TABS tablet TAKE ONE TABLET BY MOUTH EVERY DAY 90 tablet 3  . promethazine (PHENERGAN) 25 MG tablet Take 1 tablet (25 mg total) by mouth every 6 (six) hours as needed for nausea or vomiting. For nausea 30 tablet 0  . rOPINIRole (REQUIP) 2 MG tablet TAKE 1 TABLET(2 MG) BY MOUTH EVERY NIGHT 90 tablet 0  . testosterone cypionate (DEPOTESTOSTERONE CYPIONATE) 200 MG/ML injection INJECT 1 ML INTO THE MUSCLE Q 14 DAYS  1  . VELPHORO 500 MG chewable tablet   10  . Wound Cleansers (WOUND WASH) LIQD Apply 1 application topically daily as needed (wound care).    . metoprolol tartrate (LOPRESSOR) 25 MG tablet Take 1 tablet (25 mg total) by mouth 2 (two) times daily. 180 tablet 3   No current facility-administered medications for this visit.     Allergies  Allergen Reactions  . Prednisone Other (See Comments)    Increases sugar levels  . Adhesive [Tape] Itching and Other (See Comments)    Blisters on arm from adhesive tape at dialysis   . Amoxicillin Itching and Rash    Has patient had a PCN reaction causing immediate rash, facial/tongue/throat swelling, SOB or lightheadedness with hypotension: Yes Has patient had a PCN reaction causing severe rash involving mucus membranes or skin necrosis:  No Has patient had a PCN reaction that required hospitalization No Has patient had a PCN reaction occurring within the last 10 years: No If all of the above answers are "NO", then may proceed with Cephalosporin use.  . Clindamycin/Lincomycin Hives and Itching  . Contrast Media [Iodinated Diagnostic Agents] Itching and Rash     Systemic itching and transient rash appearance within hours of receiving contrast    . Enalapril Rash and Cough  . Mobic [Meloxicam] Hives and Rash  . Morphine Hives, Nausea Only and Rash  . Brilinta [Ticagrelor] Nausea And Vomiting  . Ciprofloxacin Itching and Rash  . Clopidogrel Rash  . Codeine Hives, Itching and Rash  . Doxycycline Rash  . Hydrocodone Rash  . Levofloxacin Hives and Itching  . Oxycodone Rash  . Rocephin [Ceftriaxone] Itching    Social History   Socioeconomic History  . Marital status: Married    Spouse name: Not on file  . Number of children: Not on file  . Years of education: Not on file  . Highest education level: Not on file  Social Needs  . Financial resource strain: Not on file  . Food insecurity - worry: Not on file  . Food insecurity - inability: Not on file  . Transportation needs - medical: Not on file  . Transportation needs - non-medical: Not on file  Occupational History  . Occupation: Disabled  Tobacco Use  . Smoking status: Never Smoker  . Smokeless tobacco: Never Used  Substance and Sexual Activity  . Alcohol use: Yes    Comment: occasional  . Drug use: No  . Sexual activity: Not on file  Other Topics Concern  . Not on file  Social History Narrative   Lives @ home with his wife in [place].     Family History  Problem Relation Age of Onset  . Colon polyps Father   . Diabetes Father   . Hypertension Father   . Hypertension Mother   . Diabetes Mother   . Hyperlipidemia Mother   . Breast cancer Maternal Aunt   . Bone cancer Maternal Aunt   . Liver cancer Maternal Aunt   . Malignant hyperthermia Neg Hx   . Heart attack Neg Hx   . Stroke Neg Hx   . Colon cancer Neg Hx   . Stomach cancer Neg Hx   . Pancreatic cancer Neg Hx   . Esophageal cancer Neg Hx     Review of Systems:  As stated in the HPI and otherwise negative.   BP 126/80   Pulse 71   Ht _0  (1.727 m)   Wt 293 lb (132.9 kg)   SpO2 93%   BMI 44.55 kg/m    Physical Examination:  General: Well developed, well nourished, NAD  HEENT: OP clear, mucus membranes moist  SKIN: warm, dry. No rashes. Neuro: No focal deficits  Musculoskeletal: Muscle strength 5/5 all ext  Psychiatric: Mood and affect normal  Neck: No JVD, no carotid bruits, no thyromegaly, no lymphadenopathy.  Lungs:Clear bilaterally, no wheezes, rhonci, crackles Cardiovascular: Regular rate and rhythm. No murmurs, gallops or rubs. Abdomen:Soft. Bowel sounds present. Non-tender.  Extremities: No lower extremity edema. Pulses are 2 + in the bilateral DP/PT.  Echo July 2018: Left ventricle: The cavity size was normal. Wall thickness was   increased in a pattern of moderate LVH. Systolic function was   normal. The estimated ejection fraction was in the range of 60%   to 65%. Wall motion was normal; there were  no regional wall   motion abnormalities. Doppler parameters are consistent with   abnormal left ventricular relaxation (grade 1 diastolic   dysfunction). The E/e&' ratio is >15, suggesting elevated LV   filling pressure. - Mitral valve: Calcified annulus. Mildly thickened leaflets .   There was trivial regurgitation. - Left atrium: The atrium was normal in size. - Tricuspid valve: There was mild regurgitation. - Pulmonary arteries: PA peak pressure: 39 mm Hg (S). - Inferior vena cava: The vessel was normal in size. The   respirophasic diameter changes were in the normal range (>= 50%),   consistent with normal central venous pressure.  Impressions:  - LVEF 60-65%, moderate LVH, normal wall motion, grade 1 DD with   elevated LV filling pressure, trivial MR, mild TR, RVSP 39 mmHg,   normal IVC.  Cardiac cath Sept 2017:  Conclusion 01/01/16   Ost LAD lesion, 30 %stenosed.  Dist LAD lesion, 30 %stenosed.  Prox RCA to Mid RCA lesion, 0 %stenosed.  1st RPLB lesion, 50 %stenosed.  A STENT XIENCE ALPINE RX [id number] drug eluting stent was successfully placed.  1st  Diag lesion, 95 %stenosed.  Post intervention, there is a 0% residual stenosis.  Dist RCA lesion, 30 %stenosed.  Two-vessel coronary obstructive disease with mild calcification of the proximal LAD with 30% narrowing, progressive 95% mid diagonal stenosis, 30% mid LAD narrowing; normal left circumflex coronary artery; and calcified large RCA with a widely patent stent in the mid RCA at the site of previous atherectomy, 30% acute margin narrowing, and 50% inferior branch PDA stenosis.  Successful PCI to the diagonal vessel with ultimate insertion of a 2.2515 mm Xience Alpine DES stent postdilated with a 2.4 mm with the 95% stenosis being reduced to 0%.    EKG:  EKG is not  ordered today. The ekg ordered today demonstrates   Recent Labs: 10/15/2016: TSH 2.970 11/29/2016: ALT 12; BUN 38; Creatinine, Ser 8.22; Hemoglobin 12.6; Platelets 278; Potassium 5.5; Sodium 140   Lipid Panel    Component Value Date/Time   CHOL 143 02/25/2015 1400   TRIG 255 (H) 02/25/2015 1400   HDL 29 (L) 02/25/2015 1400   CHOLHDL 4.9 02/25/2015 1400   VLDL 51 (H) 02/25/2015 1400   LDLCALC 63 02/25/2015 1400     Wt Readings from Last 3 Encounters:  07/06/17 293 lb (132.9 kg)  06/22/17 290 lb (131.5 kg)  03/10/17 292 lb 12.8 oz (132.8 kg)     Other studies Reviewed: Additional studies/ records that were reviewed today include: . Review of the above records demonstrates:   Assessment and Plan:   1. CAD without angina: He is known to have CAD with normal LV systolic function and no valve disease. He has had no recent chest pain. He has prior stenting of the RCA and diagonal branches. Non-obstructive disease in the LAD and Circumflex. Will continue ASA and Effient, statin. HeWill continue ASA, Effient, statin and beta blocker. He is using midodrine for hypotension on HD days.   2. ESRD: he is on HD  3. Morbid obesity: Weight loss is encouraged.   Current medicines are reviewed at length with the patient  today.  The patient does not have concerns regarding medicines.  The following changes have been made:  no change  Labs/ tests ordered today include:   No orders of the defined types were placed in this encounter.    Disposition:   FU with me in 6 months   Signed, Lauree Chandler, MD 07/06/2017 11:08  AM    [facility] [address], [place], [place]  [address] Phone: [phone]; Fax: [phone]

## 2017-07-07 ENCOUNTER — Encounter: Payer: Self-pay | Admitting: *Deleted

## 2017-07-07 ENCOUNTER — Other Ambulatory Visit: Payer: Self-pay | Admitting: *Deleted

## 2017-07-07 ENCOUNTER — Telehealth: Payer: Self-pay | Admitting: *Deleted

## 2017-07-07 DIAGNOSIS — N2581 Secondary hyperparathyroidism of renal origin: Secondary | ICD-10-CM | POA: Diagnosis not present

## 2017-07-07 DIAGNOSIS — N186 End stage renal disease: Secondary | ICD-10-CM | POA: Diagnosis not present

## 2017-07-07 DIAGNOSIS — Z89512 Acquired absence of left leg below knee: Secondary | ICD-10-CM | POA: Diagnosis not present

## 2017-07-07 DIAGNOSIS — E1022 Type 1 diabetes mellitus with diabetic chronic kidney disease: Secondary | ICD-10-CM | POA: Diagnosis not present

## 2017-07-07 DIAGNOSIS — R509 Fever, unspecified: Secondary | ICD-10-CM | POA: Diagnosis not present

## 2017-07-07 DIAGNOSIS — D509 Iron deficiency anemia, unspecified: Secondary | ICD-10-CM | POA: Diagnosis not present

## 2017-07-07 DIAGNOSIS — T82818A Embolism of vascular prosthetic devices, implants and grafts, initial encounter: Secondary | ICD-10-CM | POA: Diagnosis not present

## 2017-07-07 DIAGNOSIS — D631 Anemia in chronic kidney disease: Secondary | ICD-10-CM | POA: Diagnosis not present

## 2017-07-07 NOTE — Telephone Encounter (Signed)
Reviewed  in detail instructions for fistulogram for 07/11/17 to be at [facility] hospital at 5:30 am. NPO past MN meds and insulin adjustment instruction per letter. Wife/caregiver verbalized understanding Letter faxed to [facility] dialysis center to be given to patient on 07/10/17.

## 2017-07-10 ENCOUNTER — Telehealth: Payer: Self-pay | Admitting: Adult Health

## 2017-07-10 DIAGNOSIS — G4733 Obstructive sleep apnea (adult) (pediatric): Secondary | ICD-10-CM

## 2017-07-10 DIAGNOSIS — E1022 Type 1 diabetes mellitus with diabetic chronic kidney disease: Secondary | ICD-10-CM | POA: Diagnosis not present

## 2017-07-10 DIAGNOSIS — N2581 Secondary hyperparathyroidism of renal origin: Secondary | ICD-10-CM | POA: Diagnosis not present

## 2017-07-10 DIAGNOSIS — N186 End stage renal disease: Secondary | ICD-10-CM | POA: Diagnosis not present

## 2017-07-10 DIAGNOSIS — D631 Anemia in chronic kidney disease: Secondary | ICD-10-CM | POA: Diagnosis not present

## 2017-07-10 DIAGNOSIS — D509 Iron deficiency anemia, unspecified: Secondary | ICD-10-CM | POA: Diagnosis not present

## 2017-07-10 DIAGNOSIS — R509 Fever, unspecified: Secondary | ICD-10-CM | POA: Diagnosis not present

## 2017-07-10 DIAGNOSIS — T82818A Embolism of vascular prosthetic devices, implants and grafts, initial encounter: Secondary | ICD-10-CM | POA: Diagnosis not present

## 2017-07-10 NOTE — Telephone Encounter (Signed)
Per TP: okay for split night  Order placed

## 2017-07-10 NOTE — Telephone Encounter (Signed)
CPAP Titration Study ordered for this patient. Pt has not been on therapy since 2017. Per Sleep Lab, pt will need a Split Night instead of CPAP Titration Study, since patient has not been compliant.   Pt is scheduled for Wed 07/12/17 at [facility].  Please advise. Rhonda J Cobb

## 2017-07-11 ENCOUNTER — Ambulatory Visit (HOSPITAL_COMMUNITY)
Admission: RE | Admit: 2017-07-11 | Discharge: 2017-07-11 | Disposition: A | Discharge status: Home / Self Care | Payer: Medicare Other | Source: Ambulatory Visit | Attending: Vascular Surgery | Admitting: Vascular Surgery

## 2017-07-11 ENCOUNTER — Encounter (HOSPITAL_COMMUNITY): Payer: Self-pay | Admitting: Vascular Surgery

## 2017-07-11 ENCOUNTER — Encounter (HOSPITAL_COMMUNITY)
Admission: RE | Disposition: A | Discharge status: Home / Self Care | Payer: Self-pay | Source: Ambulatory Visit | Attending: Vascular Surgery

## 2017-07-11 DIAGNOSIS — Z91048 Other nonmedicinal substance allergy status: Secondary | ICD-10-CM

## 2017-07-11 DIAGNOSIS — I132 Hypertensive heart and chronic kidney disease with heart failure and with stage 5 chronic kidney disease, or end stage renal disease: Secondary | ICD-10-CM | POA: Diagnosis not present

## 2017-07-11 DIAGNOSIS — E1043 Type 1 diabetes mellitus with diabetic autonomic (poly)neuropathy: Secondary | ICD-10-CM | POA: Diagnosis not present

## 2017-07-11 DIAGNOSIS — I878 Other specified disorders of veins: Secondary | ICD-10-CM | POA: Diagnosis present

## 2017-07-11 DIAGNOSIS — Y832 Surgical operation with anastomosis, bypass or graft as the cause of abnormal reaction of the patient, or of later complication, without mention of misadventure at the time of the procedure: Secondary | ICD-10-CM | POA: Insufficient documentation

## 2017-07-11 DIAGNOSIS — Z9981 Dependence on supplemental oxygen: Secondary | ICD-10-CM | POA: Diagnosis not present

## 2017-07-11 DIAGNOSIS — M797 Fibromyalgia: Secondary | ICD-10-CM | POA: Insufficient documentation

## 2017-07-11 DIAGNOSIS — K7689 Other specified diseases of liver: Secondary | ICD-10-CM | POA: Diagnosis not present

## 2017-07-11 DIAGNOSIS — Z881 Allergy status to other antibiotic agents status: Secondary | ICD-10-CM

## 2017-07-11 DIAGNOSIS — E785 Hyperlipidemia, unspecified: Secondary | ICD-10-CM

## 2017-07-11 DIAGNOSIS — I252 Old myocardial infarction: Secondary | ICD-10-CM

## 2017-07-11 DIAGNOSIS — D631 Anemia in chronic kidney disease: Secondary | ICD-10-CM | POA: Diagnosis not present

## 2017-07-11 DIAGNOSIS — K8050 Calculus of bile duct without cholangitis or cholecystitis without obstruction: Secondary | ICD-10-CM | POA: Diagnosis not present

## 2017-07-11 DIAGNOSIS — G4733 Obstructive sleep apnea (adult) (pediatric): Secondary | ICD-10-CM

## 2017-07-11 DIAGNOSIS — E039 Hypothyroidism, unspecified: Secondary | ICD-10-CM

## 2017-07-11 DIAGNOSIS — I120 Hypertensive chronic kidney disease with stage 5 chronic kidney disease or end stage renal disease: Secondary | ICD-10-CM | POA: Diagnosis not present

## 2017-07-11 DIAGNOSIS — K219 Gastro-esophageal reflux disease without esophagitis: Secondary | ICD-10-CM | POA: Diagnosis present

## 2017-07-11 DIAGNOSIS — I2510 Atherosclerotic heart disease of native coronary artery without angina pectoris: Secondary | ICD-10-CM | POA: Diagnosis present

## 2017-07-11 DIAGNOSIS — Z794 Long term (current) use of insulin: Secondary | ICD-10-CM | POA: Insufficient documentation

## 2017-07-11 DIAGNOSIS — E1151 Type 2 diabetes mellitus with diabetic peripheral angiopathy without gangrene: Secondary | ICD-10-CM

## 2017-07-11 DIAGNOSIS — Z89512 Acquired absence of left leg below knee: Secondary | ICD-10-CM

## 2017-07-11 DIAGNOSIS — R110 Nausea: Secondary | ICD-10-CM | POA: Diagnosis not present

## 2017-07-11 DIAGNOSIS — F40240 Claustrophobia: Secondary | ICD-10-CM | POA: Diagnosis present

## 2017-07-11 DIAGNOSIS — R404 Transient alteration of awareness: Secondary | ICD-10-CM | POA: Diagnosis not present

## 2017-07-11 DIAGNOSIS — Z940 Kidney transplant status: Secondary | ICD-10-CM | POA: Diagnosis not present

## 2017-07-11 DIAGNOSIS — Z885 Allergy status to narcotic agent status: Secondary | ICD-10-CM

## 2017-07-11 DIAGNOSIS — M109 Gout, unspecified: Secondary | ICD-10-CM | POA: Diagnosis present

## 2017-07-11 DIAGNOSIS — E1022 Type 1 diabetes mellitus with diabetic chronic kidney disease: Secondary | ICD-10-CM | POA: Diagnosis not present

## 2017-07-11 DIAGNOSIS — E1051 Type 1 diabetes mellitus with diabetic peripheral angiopathy without gangrene: Secondary | ICD-10-CM | POA: Diagnosis present

## 2017-07-11 DIAGNOSIS — G8929 Other chronic pain: Secondary | ICD-10-CM | POA: Diagnosis present

## 2017-07-11 DIAGNOSIS — Z79890 Hormone replacement therapy (postmenopausal): Secondary | ICD-10-CM

## 2017-07-11 DIAGNOSIS — E8889 Other specified metabolic disorders: Secondary | ICD-10-CM | POA: Diagnosis not present

## 2017-07-11 DIAGNOSIS — N2581 Secondary hyperparathyroidism of renal origin: Secondary | ICD-10-CM | POA: Diagnosis not present

## 2017-07-11 DIAGNOSIS — R531 Weakness: Secondary | ICD-10-CM | POA: Diagnosis not present

## 2017-07-11 DIAGNOSIS — Z955 Presence of coronary angioplasty implant and graft: Secondary | ICD-10-CM

## 2017-07-11 DIAGNOSIS — Z7982 Long term (current) use of aspirin: Secondary | ICD-10-CM | POA: Insufficient documentation

## 2017-07-11 DIAGNOSIS — E1122 Type 2 diabetes mellitus with diabetic chronic kidney disease: Secondary | ICD-10-CM

## 2017-07-11 DIAGNOSIS — Z22322 Carrier or suspected carrier of Methicillin resistant Staphylococcus aureus: Secondary | ICD-10-CM

## 2017-07-11 DIAGNOSIS — Z452 Encounter for adjustment and management of vascular access device: Secondary | ICD-10-CM | POA: Diagnosis not present

## 2017-07-11 DIAGNOSIS — I5022 Chronic systolic (congestive) heart failure: Secondary | ICD-10-CM

## 2017-07-11 DIAGNOSIS — K3184 Gastroparesis: Secondary | ICD-10-CM | POA: Diagnosis present

## 2017-07-11 DIAGNOSIS — R112 Nausea with vomiting, unspecified: Secondary | ICD-10-CM | POA: Diagnosis not present

## 2017-07-11 DIAGNOSIS — Z89421 Acquired absence of other right toe(s): Secondary | ICD-10-CM

## 2017-07-11 DIAGNOSIS — R1010 Upper abdominal pain, unspecified: Secondary | ICD-10-CM | POA: Diagnosis not present

## 2017-07-11 DIAGNOSIS — T82818A Embolism of vascular prosthetic devices, implants and grafts, initial encounter: Secondary | ICD-10-CM | POA: Diagnosis not present

## 2017-07-11 DIAGNOSIS — R740 Nonspecific elevation of levels of transaminase and lactic acid dehydrogenase [LDH]: Secondary | ICD-10-CM | POA: Diagnosis present

## 2017-07-11 DIAGNOSIS — N186 End stage renal disease: Secondary | ICD-10-CM

## 2017-07-11 DIAGNOSIS — R1011 Right upper quadrant pain: Secondary | ICD-10-CM | POA: Diagnosis not present

## 2017-07-11 DIAGNOSIS — Z79899 Other long term (current) drug therapy: Secondary | ICD-10-CM

## 2017-07-11 DIAGNOSIS — Z6841 Body Mass Index (BMI) 40.0 and over, adult: Secondary | ICD-10-CM

## 2017-07-11 DIAGNOSIS — I5032 Chronic diastolic (congestive) heart failure: Secondary | ICD-10-CM | POA: Diagnosis not present

## 2017-07-11 DIAGNOSIS — Z992 Dependence on renal dialysis: Secondary | ICD-10-CM | POA: Diagnosis not present

## 2017-07-11 DIAGNOSIS — E109 Type 1 diabetes mellitus without complications: Secondary | ICD-10-CM | POA: Diagnosis not present

## 2017-07-11 DIAGNOSIS — Z880 Allergy status to penicillin: Secondary | ICD-10-CM | POA: Insufficient documentation

## 2017-07-11 DIAGNOSIS — J398 Other specified diseases of upper respiratory tract: Secondary | ICD-10-CM | POA: Diagnosis not present

## 2017-07-11 DIAGNOSIS — Z789 Other specified health status: Secondary | ICD-10-CM

## 2017-07-11 DIAGNOSIS — Z888 Allergy status to other drugs, medicaments and biological substances status: Secondary | ICD-10-CM | POA: Insufficient documentation

## 2017-07-11 DIAGNOSIS — T82898A Other specified complication of vascular prosthetic devices, implants and grafts, initial encounter: Secondary | ICD-10-CM

## 2017-07-11 DIAGNOSIS — K8020 Calculus of gallbladder without cholecystitis without obstruction: Secondary | ICD-10-CM | POA: Diagnosis present

## 2017-07-11 DIAGNOSIS — Z9641 Presence of insulin pump (external) (internal): Secondary | ICD-10-CM | POA: Diagnosis present

## 2017-07-11 DIAGNOSIS — A419 Sepsis, unspecified organism: Principal | ICD-10-CM | POA: Diagnosis present

## 2017-07-11 DIAGNOSIS — Z7902 Long term (current) use of antithrombotics/antiplatelets: Secondary | ICD-10-CM

## 2017-07-11 DIAGNOSIS — R402413 Glasgow coma scale score 13-15, at hospital admission: Secondary | ICD-10-CM | POA: Diagnosis present

## 2017-07-11 DIAGNOSIS — I739 Peripheral vascular disease, unspecified: Secondary | ICD-10-CM | POA: Diagnosis present

## 2017-07-11 DIAGNOSIS — R109 Unspecified abdominal pain: Secondary | ICD-10-CM | POA: Diagnosis not present

## 2017-07-11 DIAGNOSIS — Z79891 Long term (current) use of opiate analgesic: Secondary | ICD-10-CM

## 2017-07-11 DIAGNOSIS — R748 Abnormal levels of other serum enzymes: Secondary | ICD-10-CM | POA: Diagnosis present

## 2017-07-11 DIAGNOSIS — I517 Cardiomegaly: Secondary | ICD-10-CM | POA: Diagnosis not present

## 2017-07-11 DIAGNOSIS — Z886 Allergy status to analgesic agent status: Secondary | ICD-10-CM

## 2017-07-11 DIAGNOSIS — R12 Heartburn: Secondary | ICD-10-CM | POA: Diagnosis not present

## 2017-07-11 DIAGNOSIS — D509 Iron deficiency anemia, unspecified: Secondary | ICD-10-CM | POA: Diagnosis not present

## 2017-07-11 DIAGNOSIS — R509 Fever, unspecified: Secondary | ICD-10-CM | POA: Diagnosis not present

## 2017-07-11 DIAGNOSIS — Z91041 Radiographic dye allergy status: Secondary | ICD-10-CM

## 2017-07-11 DIAGNOSIS — R945 Abnormal results of liver function studies: Secondary | ICD-10-CM | POA: Diagnosis not present

## 2017-07-11 DIAGNOSIS — M1990 Unspecified osteoarthritis, unspecified site: Secondary | ICD-10-CM | POA: Diagnosis present

## 2017-07-11 HISTORY — PX: PERIPHERAL VASCULAR BALLOON ANGIOPLASTY: CATH118281

## 2017-07-11 HISTORY — PX: A/V FISTULAGRAM: CATH118298

## 2017-07-11 LAB — GLUCOSE, CAPILLARY: Glucose-Capillary: 123 mg/dL — ABNORMAL HIGH (ref 65–99)

## 2017-07-11 LAB — POCT I-STAT, CHEM 8
BUN: 47 mg/dL — ABNORMAL HIGH (ref 6–20)
Calcium, Ion: 1.14 mmol/L — ABNORMAL LOW (ref 1.15–1.40)
Chloride: 97 mmol/L — ABNORMAL LOW (ref 101–111)
Creatinine, Ser: 8.4 mg/dL — ABNORMAL HIGH (ref 0.61–1.24)
Glucose, Bld: 169 mg/dL — ABNORMAL HIGH (ref 65–99)
HCT: 34 % — ABNORMAL LOW (ref 39.0–52.0)
Hemoglobin: 11.6 g/dL — ABNORMAL LOW (ref 13.0–17.0)
Potassium: 5 mmol/L (ref 3.5–5.1)
Sodium: 140 mmol/L (ref 135–145)
TCO2: 36 mmol/L — ABNORMAL HIGH (ref 22–32)

## 2017-07-11 SURGERY — A/V FISTULAGRAM
Anesthesia: LOCAL

## 2017-07-11 MED ORDER — HEPARIN SODIUM (PORCINE) 1000 UNIT/ML IJ SOLN
INTRAMUSCULAR | Status: AC
  Filled 2017-07-11: qty 1

## 2017-07-11 MED ORDER — DIPHENHYDRAMINE HCL 50 MG/ML IJ SOLN
INTRAMUSCULAR | Status: AC
  Administered 2017-07-11: 50 mg via INTRAVENOUS
  Filled 2017-07-11: qty 1

## 2017-07-11 MED ORDER — LIDOCAINE HCL 1 % IJ SOLN
INTRAMUSCULAR | Status: AC
  Filled 2017-07-11: qty 20

## 2017-07-11 MED ORDER — SODIUM CHLORIDE 0.9% FLUSH: 3.0000 mL | INTRAVENOUS | Status: DC | PRN

## 2017-07-11 MED ORDER — HEPARIN (PORCINE) IN NACL 2-0.9 UNIT/ML-% IJ SOLN
INTRAMUSCULAR | Status: AC
  Filled 2017-07-11: qty 500

## 2017-07-11 MED ORDER — FENTANYL CITRATE (PF) 100 MCG/2ML IJ SOLN
INTRAMUSCULAR | Status: AC
  Filled 2017-07-11: qty 2

## 2017-07-11 MED ORDER — FENTANYL CITRATE (PF) 100 MCG/2ML IJ SOLN
INTRAMUSCULAR | Status: DC | PRN
  Administered 2017-07-11: 25 ug via INTRAVENOUS

## 2017-07-11 MED ORDER — DIPHENHYDRAMINE HCL 50 MG/ML IJ SOLN
50.0000 mg | INTRAMUSCULAR | Status: AC
  Administered 2017-07-11: 50 mg via INTRAVENOUS

## 2017-07-11 MED ORDER — FAMOTIDINE IN NACL 20-0.9 MG/50ML-% IV SOLN
INTRAVENOUS | Status: AC
  Administered 2017-07-11: 20 mg via INTRAVENOUS
  Filled 2017-07-11: qty 50

## 2017-07-11 MED ORDER — FAMOTIDINE IN NACL 20-0.9 MG/50ML-% IV SOLN
20.0000 mg | INTRAVENOUS | Status: AC
  Administered 2017-07-11: 20 mg via INTRAVENOUS

## 2017-07-11 MED ORDER — METHYLPREDNISOLONE SODIUM SUCC 125 MG IJ SOLR
INTRAMUSCULAR | Status: AC
  Administered 2017-07-11: 125 mg via INTRAVENOUS
  Filled 2017-07-11: qty 2

## 2017-07-11 MED ORDER — MIDAZOLAM HCL 2 MG/2ML IJ SOLN
INTRAMUSCULAR | Status: AC
  Filled 2017-07-11: qty 2

## 2017-07-11 MED ORDER — LIDOCAINE HCL (PF) 1 % IJ SOLN
INTRAMUSCULAR | Status: DC | PRN
  Administered 2017-07-11: 5 mL

## 2017-07-11 MED ORDER — HEPARIN (PORCINE) IN NACL 2-0.9 UNIT/ML-% IJ SOLN
INTRAMUSCULAR | Status: AC | PRN
  Administered 2017-07-11: 500 mL

## 2017-07-11 MED ORDER — HEPARIN SODIUM (PORCINE) 1000 UNIT/ML IJ SOLN
INTRAMUSCULAR | Status: DC | PRN
  Administered 2017-07-11: 5000 [IU] via INTRAVENOUS

## 2017-07-11 MED ORDER — IODIXANOL 320 MG/ML IV SOLN
INTRAVENOUS | Status: DC | PRN
  Administered 2017-07-11: 43 mL via INTRAVENOUS

## 2017-07-11 MED ORDER — MIDAZOLAM HCL 2 MG/2ML IJ SOLN
INTRAMUSCULAR | Status: DC | PRN
  Administered 2017-07-11: 2 mg via INTRAVENOUS

## 2017-07-11 MED ORDER — METHYLPREDNISOLONE SODIUM SUCC 125 MG IJ SOLR
125.0000 mg | INTRAMUSCULAR | Status: AC
  Administered 2017-07-11: 125 mg via INTRAVENOUS

## 2017-07-11 SURGICAL SUPPLY — 19 items
BAG SNAP BAND KOVER 36X36 (MISCELLANEOUS) ×3 IMPLANT
BALLN LUTONIX AV 6X60X75 (BALLOONS) ×3
BALLN MUSTANG 5.0X20 75 (BALLOONS) ×3
BALLOON LUTONIX AV 6X60X75 (BALLOONS) IMPLANT
BALLOON MUSTANG 5.0X20 75 (BALLOONS) IMPLANT
CATH ANGIO 5F BER2 65CM (CATHETERS) ×1 IMPLANT
COVER DOME SNAP 22 D (MISCELLANEOUS) ×3 IMPLANT
COVER PRB 48X5XTLSCP FOLD TPE (BAG) ×2 IMPLANT
COVER PROBE 5X48 (BAG) ×3
HOVERMATT SINGLE USE (MISCELLANEOUS) ×1 IMPLANT
KIT ENCORE 26 ADVANTAGE (KITS) ×1 IMPLANT
KIT MICROPUNCTURE NIT STIFF (SHEATH) ×1 IMPLANT
PROTECTION STATION PRESSURIZED (MISCELLANEOUS) ×3
SHEATH PINNACLE R/O II 5F 6CM (SHEATH) ×1 IMPLANT
STATION PROTECTION PRESSURIZED (MISCELLANEOUS) ×2 IMPLANT
STOPCOCK MORSE 400PSI 3WAY (MISCELLANEOUS) ×3 IMPLANT
TRAY PV CATH (CUSTOM PROCEDURE TRAY) ×3 IMPLANT
TUBING CIL FLEX 10 FLL-RA (TUBING) ×3 IMPLANT
WIRE BENTSON .035X145CM (WIRE) ×1 IMPLANT

## 2017-07-11 NOTE — Op Note (Signed)
    Patient name: Dale [name] MRN: [id number] DOB: [birth year] Sex: male  07/11/2017 Pre-operative Diagnosis: esrd, decreased clearance of left arm avf Post-operative diagnosis:  Same Surgeon:  Eda Paschal. Donzetta Matters, MD Procedure Performed: 1.  US guided cannulation of left arm avf 2.  shuntogram left arm 3.  Drug coated balloon angioplasty of av anastomosis with 58mm lutonix 4.  Moderate sedation with fentanyl and versed for 31 minutes   Indications: 52 year old male history end-stage renal disease on dialysis via left arm fistula.  He has a previous surgical revision of his AV anastomosis with patch angioplasty.  He also has stenting of his left cephalic arch.  He now has delayed clearance times on dialysis and is indicated for fistulogram with possible intervention.  Findings: There is an 80% stenosis near the arterial anastomosis which was ballooned with 5 mm balloon followed by 6 mm drug-coated balloon.  At completion there is 0% residual stenosis and improved thrill throughout the fistula.  The cephalic vein fistula is patent throughout its course as well as the cephalic arch stent.   Procedure:  The patient was identified in the holding area and taken to room 8.  The patient was then placed supine on the table and prepped and draped in the usual sterile fashion.  A time out was called.  Ultrasound was used to evaluate the left arm AV fistula this was cannulated with micropuncture needle followed by sheath.  We then performed left upper extremity shuntogram which demonstrated the above findings including a retrograde shot which demonstrated 80% stenosis near the AV anastomosis.  Following this then placed a Bentson wire and used a bare catheter to direct the wire towards the arterial anastomosis and then placed the micropuncture sheath to straighten the wire and then placed a short 5 French sheath and the patient was given heparin at this time to a total of 5000 units.  Following this I performed  balloon angioplasty of the arterial anastomosis with a 5 mm balloon and angiogram demonstrated mostly resolution.  We follow this up with 6 mm drug-coated balloon angioplasty dilated to nominal pressure for 2 minutes.  At completion there was no residual stenosis and there is brisk flow with improved thrill throughout the fistula.  Patient did tolerate procedure well without immediate complication.   Contrast: 43cc  Dean Goldner C. Donzetta Matters, MD [facility] Office: [phone] Pager: [phone]

## 2017-07-11 NOTE — Discharge Instructions (Signed)

## 2017-07-11 NOTE — Telephone Encounter (Signed)
Called and LMOAM for Terri at [facility], that order has been placed to change patient from CPAP Titration Study to a Split Night Study. Pt's appointment is on 07/12/17.  Advised that if she had any questions, to contact me back at [phone]. Rhonda J Cobb

## 2017-07-11 NOTE — H&P (Signed)
HP   History of Present Illness: This is a 52 y.o. male on hd m-w-f via left arm avf. Having delayed clearance. Continues on effient but no bleeding issues on hd.  Past Medical History:  Diagnosis Date  . Anal fissure   . Anemia of chronic disease   . Arthritis    "hands, right knee" (03/19/2014) no cartlidge in right knee  . CAD (coronary artery disease)    a. Abnl nuc ->LHC (11/15):  mid to dist Dx 80%, mid RCA 99% (functional CTO), dist RCA 80% >> PCI: balloon angioplasty to mid RCA (could not deliver stent) - staged PCI Rotoblator atherectomy/DES to [facility]. b. NSTEMI 12/2015 s/p DES to diagonal.  . Cholecystitis    a. 08/27/2011  . Chronic diastolic CHF (congestive heart failure) ([place])    a. Echo 3/13:  EF 55-60%;  b. Echo (11/15):  Mild LVH, EF 60-65%, no RWMA, Gr 1 DD, MAC, mild LAE, normal RVF, mild RAE;  c.  Echo 5/16:  severe LVH, EF 55-60%, no RWMA, Gr 1 DD, MAC, mild LAE  . Claustrophobia    when things get around his face.   . Complication of anesthesia   . Difficult intubation    only once in 2015 at [facility] haven't had a problem since then  . Diverticulosis   . ESRD (end stage renal disease) ([place])    a. 1995 s/p cadaveric transplant w/ susbequent failure after 18 yrs;  b.Dialysis initiated 07/2011 [facility] M-W-F  . Fibromyalgia   . Gastroparesis   . GERD (gastroesophageal reflux disease)   . Gout    PMH  . History of blood transfusion   . HLD (hyperlipidemia)   . Hypertension   . Hypothyroidism   . Inguinal lymphadenopathy    a. bilateral - s/p biopsy 07/2011  . Insulin dependent diabetes mellitus (HCC)    Type I  . Monilial esophagitis ([place])   . Morbid obesity ([place])   . OSA (obstructive sleep apnea)    adjustable bed  . PAD (peripheral artery disease) (HCC)    a. s/p L BKA.  [name] Kitchen Pericardial effusion    a.  Small by CT 08/22/11;  b.  Large by CT 08/27/11. c. Not seen on 11/2015 echo.  . Pneumonia ~ 2007  . PONV (postoperative nausea and vomiting)    vomited  once after a procedure.  . Tracheobronchomalacia     Past Surgical History:  Procedure Laterality Date  . A/V FISTULAGRAM Left 07/26/2016   Procedure: A/V Fistulagram;  Surgeon: Waynetta Sandy, MD;  Location: [place] [facility];  Service: Cardiovascular;  Laterality: Left;  . AMPUTATION OF REPLICATED TOES Right   . ANGIOPLASTY  04/09/2014   RCA   . APPENDECTOMY  ~ 2006  . ARTERIOVENOUS GRAFT PLACEMENT Left 1993?   forearm  . ARTERIOVENOUS GRAFT PLACEMENT Right 1993?   leg  . ARTERIOVENOUS GRAFT PLACEMENT Left    Thigh  . Arteriovenous Graft Removed Left    Thigh  . AV FISTULA PLACEMENT  07/29/2011   Procedure: ARTERIOVENOUS (AV) FISTULA CREATION;  Surgeon: Mal Misty, MD;  Location: [place];  Service: Vascular;  Laterality: Right;  Brachial cephalic  . AV FISTULA PLACEMENT Left 01/20/2015   Procedure: LIGATION OF RIGHT ARTERIOVENOUS FISTULA WITH EXCISION OF ANEURYSM;  Surgeon: Angelia Mould, MD;  Location: [place];  Service: Vascular;  Laterality: Left;  . AV FISTULA PLACEMENT Left 04/30/2015   Procedure: CREATION OF LEFT ARM BRACHIO-CEPHALIC ARTERIOVENOUS (AV) FISTULA ;  Surgeon: Angelia Mould, MD;  [facility];  Service: Vascular;  Laterality: Left;  . AV Graft PLACEMENT Left 1993?   "attempted one in my wrist; didn't take"  . BELOW KNEE LEG AMPUTATION Left 2010  . CARDIAC CATHETERIZATION  03/19/2014  . CARDIAC CATHETERIZATION  03/19/2014   Procedure: CORONARY BALLOON ANGIOPLASTY;  Surgeon: Burnell Blanks, MD;  Location: [facility] CATH LAB;  Service: Cardiovascular;;  . CARDIAC CATHETERIZATION N/A 01/01/2016   Procedure: Left Heart Cath and Coronary Angiography;  Surgeon: Troy Sine, MD;  Location: [place] [facility];  Service: Cardiovascular;  Laterality: N/A;  . CARDIAC CATHETERIZATION N/A 01/01/2016   Procedure: Coronary Stent Intervention;  Surgeon: Troy Sine, MD;  Location: [place] [facility];  Service: Cardiovascular;  Laterality: N/A;  .  CARPAL TUNNEL RELEASE Bilateral after 2005  . CATARACT EXTRACTION W/ INTRAOCULAR LENS  IMPLANT, BILATERAL Bilateral 1990's  . COLONOSCOPY  08/29/2011   Procedure: COLONOSCOPY;  Surgeon: Jerene Bears, MD;  Location: [place];  Service: Gastroenterology;  Laterality: N/A;  . COLONOSCOPY WITH PROPOFOL N/A 04/26/2016   Procedure: COLONOSCOPY WITH PROPOFOL;  Surgeon: Jerene Bears, MD;  Location: [facility] ENDOSCOPY;  Service: Gastroenterology;  Laterality: N/A;  . CORONARY ANGIOPLASTY WITH STENT PLACEMENT  04/09/2014       ptca/des mid lad   . ESOPHAGOGASTRODUODENOSCOPY N/A 12/25/2015   Procedure: ESOPHAGOGASTRODUODENOSCOPY (EGD);  Surgeon: Mauri Pole, MD;  Location: [facility] ENDOSCOPY;  Service: Endoscopy;  Laterality: N/A;  bedside  . ESOPHAGOGASTRODUODENOSCOPY (EGD) WITH PROPOFOL N/A 04/26/2016   Procedure: ESOPHAGOGASTRODUODENOSCOPY (EGD) WITH PROPOFOL;  Surgeon: Jerene Bears, MD;  Location: [facility] ENDOSCOPY;  Service: Gastroenterology;  Laterality: N/A;  . EYE SURGERY Bilateral    cataract  . FEMORAL ARTERY REPAIR  04/09/2014   ANGIOSEAL  . INSERTION OF DIALYSIS CATHETER  08/01/2011   Procedure: INSERTION OF DIALYSIS CATHETER;  Surgeon: Rosetta Posner, MD;  Location: [place];  Service: Vascular;  Laterality: Right;  insertion of dialysis catheter on right internal jugular vein  . INSERTION OF DIALYSIS CATHETER Right 01/20/2015   Procedure: INSERTION OF DIALYSIS CATHETER - RIGHT INTERNAL JUGULAR ;  Surgeon: Angelia Mould, MD;  Location: [place];  Service: Vascular;  Laterality: Right;  . KIDNEY TRANSPLANT  04/25/1993  . LEFT HEART CATHETERIZATION WITH CORONARY ANGIOGRAM N/A 03/19/2014   Procedure: LEFT HEART CATHETERIZATION WITH CORONARY ANGIOGRAM;  Surgeon: Burnell Blanks, MD;  Location: [facility] CATH LAB;  Service: Cardiovascular;  Laterality: N/A;  . LYMPH NODE BIOPSY  08/10/2011   Procedure: LYMPH NODE BIOPSY;  Surgeon: Merrie Roof, MD;  Location: [place];  Service: General;  Laterality: Left;  left  groin lymph biopsy  . NEUROPLASTY / TRANSPOSITION ULNAR NERVE AT ELBOW Left after 2005  . PATCH ANGIOPLASTY Left 08/09/2016   Procedure: LEFT  ARTERIOVENOUS FISTULA PATCH ANGIOPLASTY;  Surgeon: Waynetta Sandy, MD;  Location: [place];  Service: Vascular;  Laterality: Left;  . PERCUTANEOUS CORONARY ROTOBLATOR INTERVENTION (PCI-R) N/A 04/09/2014   Procedure: PERCUTANEOUS CORONARY ROTOBLATOR INTERVENTION (PCI-R);  Surgeon: Burnell Blanks, MD;  Location[name] C[facility] LAB;  Service: Cardiovascular;  Laterality: N/A;  . PERIPHERAL VASCULAR CATHETERIZATION N/A 12/25/2014   Procedure: Upper Extremity Venography;  Surgeon: Conrad [place], MD;  Location: [place] [facility];  Service: Cardiovascular;  Laterality: N/A;  . PERIPHERAL VASCULAR CATHETERIZATION Left 02/24/2016   Procedure: Fistulagram;  Surgeon: Waynetta Sandy, MD;  Location: [place] [facility];  Service: Cardiovascular;  Laterality: Left;  .  PERIPHERAL VASCULAR CATHETERIZATION Left 02/24/2016   Procedure: Peripheral Vascular Intervention;  Surgeon: Waynetta Sandy, MD;  Location: [place] [facility];  Service: Cardiovascular;  Laterality: Left;  AV FISTULA  . PERITONEAL CATHETER INSERTION    . PERITONEAL CATHETER REMOVAL    . REVISON OF ARTERIOVENOUS FISTULA Right 12/27/90   Procedure: PLICATION / REVISION OF ARTERIOVENOUS FISTULA;  Surgeon: Angelia Mould, MD;  Location: [place];  Service: Vascular;  Laterality: Right;  . REVISON OF ARTERIOVENOUS FISTULA Left 09/01/2015   Procedure: SUPERFICIALIZATION OF LEFT ARM BRACHIOCEPHALIC ARTERIOVENOUS FISTULA;  Surgeon: Angelia Mould, MD;  Location: [place];  Service: Vascular;  Laterality: Left;  . REVISON OF ARTERIOVENOUS FISTULA Left 08/09/2016   Procedure: REVISION OF ARTERIAL ANASTOMOSIS OF ARTERIOVENOUS FISTULA;  Surgeon: Waynetta Sandy, MD;  Location: [place];  Service: Vascular;  Laterality: Left;  . SHOULDER ARTHROSCOPY Left ~ 2006   frozen  . TOE  AMPUTATION Bilateral after 2005  . UMBILICAL HERNIA REPAIR  1990's  . VENOGRAM Right 07/28/2011   Procedure: VENOGRAM;  Surgeon: Conrad Providence, MD;  Location: [facility] CATH LAB;  Service: Cardiovascular;  Laterality: Right;  . VITRECTOMY Bilateral     Allergies  Allergen Reactions  . Iodinated Diagnostic Agents Itching, Rash and Other (See Comments)    Systemic itching and transient rash appearance within hours of receiving contrast    . Prednisone Other (See Comments)    Increases sugar levels  . Adhesive [Tape] Itching and Other (See Comments)    Blisters on arm from adhesive tape at dialysis   . Amoxicillin Itching, Rash and Other (See Comments)    Has patient had a PCN reaction causing immediate rash, facial/tongue/throat swelling, SOB or lightheadedness with hypotension: Yes Has patient had a PCN reaction causing severe rash involving mucus membranes or skin necrosis: No Has patient had a PCN reaction that required hospitalization No Has patient had a PCN reaction occurring within the last 10 years: No If all of the above answers are "NO", then may proceed with Cephalosporin use.  . Clindamycin/Lincomycin Hives and Itching  . Enalapril Rash and Cough  . Mobic [Meloxicam] Hives and Rash  . Morphine Hives, Nausea Only and Rash  . Brilinta [Ticagrelor] Nausea And Vomiting  . Ciprofloxacin Itching and Rash  . Clopidogrel Rash  . Codeine Hives, Itching and Rash  . Doxycycline Rash  . Hydrocodone Rash  . Levofloxacin Hives and Itching  . Oxycodone Rash  . Rocephin [Ceftriaxone] Itching    Prior to Admission medications   Medication Sig Start Date End Date Taking? Authorizing Provider  allopurinol (ZYLOPRIM) 100 MG tablet TAKE 2 TABLETS BY MOUTH EVERY DAY Patient taking differently: TAKE 200 MG BY MOUTH EVERY DAY 03/30/17  Yes Mercy Riding, MD  aspirin EC 81 MG tablet Take 81 mg by mouth at bedtime.   Yes [provider]  atorvastatin (LIPITOR) 40 MG tablet TAKE 1 TABLET(40  MG) BY MOUTH DAILY 12/20/16  Yes Mercy Riding, MD  calcium acetate (PHOSLO) 667 MG capsule Take 667-1,334 mg by mouth See admin instructions. Take 1357ms with meals and 6647m with snacks 09/19/12  Yes [provider]  cetirizine (ZYRTEC) 10 MG tablet Take 10 mg by mouth daily.   Yes [provider]  docusate sodium (COLACE) 100 MG capsule Take 200 mg by mouth 2 (two) times daily.    Yes [provider]  fenofibrate (TRICOR) 48 MG tablet Take 48 mg by mouth daily.    Yes  [provider]  FOSRENOL 1000 MG PACK Take 1,000 mg by mouth 3 (three) times daily.  09/14/16  Yes [provider]  gabapentin (NEURONTIN) 300 MG capsule Take 300 mg by mouth 3 (three) times daily.   Yes [provider]  HYDROmorphone (DILAUDID) 2 MG tablet Take 2 mg by mouth 2 (two) times daily.    Yes [provider]  insulin lispro (HUMALOG) 100 UNIT/ML injection Inject 1-20 Units into the skin 3 (three) times daily. Blood glucose - 120, 1 unit for every 20 points over 120 ,  PT RECEIVES FROM INSULIN PUMP   Yes [provider]  levothyroxine (SYNTHROID, LEVOTHROID) 112 MCG tablet Take 112 mcg by mouth every evening. 2 hours after dinner   Yes [provider]  metoprolol tartrate (LOPRESSOR) 25 MG tablet Take 1 tablet (25 mg total) by mouth 2 (two) times daily. 03/29/17 07/11/17 Yes Burnell Blanks, MD  midodrine (PROAMATINE) 10 MG tablet Take 1 tablet (10 mg total) by mouth 3 (three) times daily. Takes 1 tab in mornings of dialysis and takes 2 tabs 30 mins before starting dialysis (m,w,f) 04/14/16  Yes Elgergawy, Silver Huguenin, MD  multivitamin (RENA-VIT) TABS tablet Take 1 tablet by mouth daily.   Yes [provider]  mupirocin ointment (BACTROBAN) 2 % Place 1 application into the nose 2 (two) times daily. 10/16/16  Yes Elgergawy, Silver Huguenin, MD  Nutritional Supplements (FEEDING SUPPLEMENT, NEPRO CARB STEADY,) LIQD Take 237 mLs by mouth daily.  01/04/16  Yes Florencia Reasons, MD  omeprazole (PRILOSEC OTC) 20 MG tablet Take 20 mg by mouth daily.   Yes [provider]  prasugrel (EFFIENT) 10 MG TABS tablet TAKE ONE TABLET BY MOUTH EVERY DAY Patient taking differently: TAKE 10 MG BY MOUTH EVERY DAY 03/09/17  Yes Eileen Stanford, PA-C  rOPINIRole (REQUIP) 2 MG tablet TAKE 1 TABLET(2 MG) BY MOUTH EVERY NIGHT 05/05/17  Yes Mercy Riding, MD  testosterone cypionate (DEPOTESTOSTERONE CYPIONATE) 200 MG/ML injection INJECT 200 MG INTO THE MUSCLE EVERY 14 DAYS 09/10/16  Yes [provider]  VELPHORO 500 MG chewable tablet  11/15/16  Yes [provider]  acetaminophen (TYLENOL) 500 MG tablet Take 1,000 mg by mouth every 4 (four) hours as needed for moderate pain or headache.  01/16/16   [provider]  fluticasone (FLONASE) 50 MCG/ACT nasal spray Place 2 sprays into both nostrils daily as needed for allergies.     [provider]  GLUCAGON EMERGENCY 1 MG injection Inject 1 mg into the muscle daily as needed (low blood sugar).  01/19/12   [provider]  hydrOXYzine (ATARAX/VISTARIL) 10 MG tablet Take 10 mg by mouth every 8 (eight) hours as needed for anxiety or vomiting.     [provider]  nitroGLYCERIN (NITROSTAT) 0.4 MG SL tablet Place 1 tablet (0.4 mg total) under the tongue every 5 (five) minutes as needed for chest pain. 02/15/16   Burnell Blanks, MD  OXYGEN Inhale 3 L into the lungs at bedtime.    [provider]  Polyethyl Glycol-Propyl Glycol (SYSTANE) 0.4-0.3 % SOLN Place 1 drop into both eyes as needed (dry eyes).    [provider]  polyethylene glycol (MIRALAX / GLYCOLAX) packet Take 17 g by mouth daily. Patient taking differently: Take 17 g by mouth daily as needed for mild constipation.  02/06/16   Regalado, Belkys A, MD  promethazine (PHENERGAN) 25 MG tablet Take 1 tablet (25 mg total) by mouth every 6 (  six) hours as needed for nausea or vomiting. For  nausea 11/27/14   Richardson Dopp T, PA-C  Wound Cleansers (WOUND [facility]) LIQD Apply 1 application topically daily as needed (wound care).    [provider]    Social History   Socioeconomic History  . Marital status: Married    Spouse name: Not on file  . Number of children: Not on file  . Years of education: Not on file  . Highest education level: Not on file  Social Needs  . Financial resource strain: Not on file  . Food insecurity - worry: Not on file  . Food insecurity - inability: Not on file  . Transportation needs - medical: Not on file  . Transportation needs - non-medical: Not on file  Occupational History  . Occupation: Disabled  Tobacco Use  . Smoking status: Never Smoker  . Smokeless tobacco: Never Used  Substance and Sexual Activity  . Alcohol use: Yes    Comment: occasional  . Drug use: No  . Sexual activity: Not on file  Other Topics Concern  . Not on file  Social History Narrative   Lives @ home with his wife in [place].      Family History  Problem Relation Age of Onset  . Colon polyps Father   . Diabetes Father   . Hypertension Father   . Hypertension Mother   . Diabetes Mother   . Hyperlipidemia Mother   . Breast cancer Maternal Aunt   . Bone cancer Maternal Aunt   . Liver cancer Maternal Aunt   . Malignant hyperthermia Neg Hx   . Heart attack Neg Hx   . Stroke Neg Hx   . Colon cancer Neg Hx   . Stomach cancer Neg Hx   . Pancreatic cancer Neg Hx   . Esophageal cancer Neg Hx     ROS: no complaints today  Physical Examination  Vitals:   07/11/17 0559  BP: (!) 102/42  Pulse: 65  Resp: 18  Temp: 97.8 F (36.6 C)  SpO2: 91%   Body mass index is 43.03 kg/m.  General:  WDWN in NAD Gait: Not observed HENT: WNL, normocephalic Pulmonary: normal non-labored breathing Cardiac: palpable left arm thrill Abdomen: soft, NT/ND, no masses Neurologic: A&O X 3; Appropriate Affect ; SENSATION: normal; MOTOR FUNCTION:  moving all  extremities equally. Speech is fluent/normal   CBC    Component Value Date/Time   WBC 6.7 11/29/2016 1454   WBC 6.0 10/15/2016 0703   RBC 3.66 (L) 11/29/2016 1454   RBC 3.49 (L) 10/15/2016 0703   HGB 11.6 (L) 07/11/2017 0629   HGB 12.6 (L) 11/29/2016 1454   HCT 34.0 (L) 07/11/2017 0629   HCT 39.4 11/29/2016 1454   PLT 278 11/29/2016 1454   MCV 108 (H) 11/29/2016 1454   MCH 34.4 (H) 11/29/2016 1454   MCH 33.0 10/15/2016 0703   MCHC 32.0 11/29/2016 1454   MCHC 30.3 10/15/2016 0703   RDW 15.2 11/29/2016 1454   LYMPHSABS 1.6 11/29/2016 1454   MONOABS 0.2 10/15/2016 0703   EOSABS 0.3 11/29/2016 1454   BASOSABS 0.0 11/29/2016 1454    BMET    Component Value Date/Time   NA 140 07/11/2017 0629   NA 140 11/29/2016 1454   K 5.0 07/11/2017 0629   CL 97 (L) 07/11/2017 0629   CO2 27 11/29/2016 1454   GLUCOSE 169 (H) 07/11/2017 0629   BUN 47 (H) 07/11/2017 0629   BUN 38 (H) 11/29/2016 1454  CREATININE 8.40 (H) 07/11/2017 0629   CALCIUM 9.6 11/29/2016 1454   GFRNONAA 7 (L) 11/29/2016 1454   GFRAA 8 (L) 11/29/2016 1454    COAGS: Lab Results  Component Value Date   INR 1.26 02/25/2016   INR 1.20 12/21/2015   INR 1.14 11/20/2014        ASSESSMENT/PLAN: This is a 52 y.o. male with slowing clearance on hd. Palpable thrill proximally. Will proceed with fistulogram with intervention.  Ceferino Lang C. Donzetta Matters, MD [facility] Office: [phone] Pager: [phone]

## 2017-07-12 ENCOUNTER — Encounter (HOSPITAL_BASED_OUTPATIENT_CLINIC_OR_DEPARTMENT_OTHER): Payer: Medicare Other

## 2017-07-12 ENCOUNTER — Encounter (HOSPITAL_COMMUNITY): Payer: Self-pay

## 2017-07-12 ENCOUNTER — Emergency Department (HOSPITAL_COMMUNITY): Payer: Medicare Other

## 2017-07-12 ENCOUNTER — Inpatient Hospital Stay (HOSPITAL_COMMUNITY)
Admission: EM | Admit: 2017-07-12 | Discharge: 2017-07-18 | DRG: 853 | Disposition: A | Discharge status: Home Health | Payer: Medicare Other | Attending: Family Medicine | Admitting: Family Medicine

## 2017-07-12 DIAGNOSIS — K8050 Calculus of bile duct without cholangitis or cholecystitis without obstruction: Secondary | ICD-10-CM | POA: Diagnosis not present

## 2017-07-12 DIAGNOSIS — E109 Type 1 diabetes mellitus without complications: Secondary | ICD-10-CM

## 2017-07-12 DIAGNOSIS — T82818A Embolism of vascular prosthetic devices, implants and grafts, initial encounter: Secondary | ICD-10-CM | POA: Diagnosis not present

## 2017-07-12 DIAGNOSIS — Z6841 Body Mass Index (BMI) 40.0 and over, adult: Secondary | ICD-10-CM | POA: Diagnosis not present

## 2017-07-12 DIAGNOSIS — R112 Nausea with vomiting, unspecified: Secondary | ICD-10-CM | POA: Diagnosis not present

## 2017-07-12 DIAGNOSIS — Z9981 Dependence on supplemental oxygen: Secondary | ICD-10-CM | POA: Diagnosis not present

## 2017-07-12 DIAGNOSIS — R109 Unspecified abdominal pain: Secondary | ICD-10-CM

## 2017-07-12 DIAGNOSIS — E1051 Type 1 diabetes mellitus with diabetic peripheral angiopathy without gangrene: Secondary | ICD-10-CM | POA: Diagnosis present

## 2017-07-12 DIAGNOSIS — R404 Transient alteration of awareness: Secondary | ICD-10-CM | POA: Diagnosis not present

## 2017-07-12 DIAGNOSIS — I517 Cardiomegaly: Secondary | ICD-10-CM | POA: Diagnosis not present

## 2017-07-12 DIAGNOSIS — N186 End stage renal disease: Secondary | ICD-10-CM

## 2017-07-12 DIAGNOSIS — R1011 Right upper quadrant pain: Secondary | ICD-10-CM | POA: Diagnosis not present

## 2017-07-12 DIAGNOSIS — G4733 Obstructive sleep apnea (adult) (pediatric): Secondary | ICD-10-CM | POA: Diagnosis present

## 2017-07-12 DIAGNOSIS — K8020 Calculus of gallbladder without cholecystitis without obstruction: Secondary | ICD-10-CM | POA: Diagnosis not present

## 2017-07-12 DIAGNOSIS — D509 Iron deficiency anemia, unspecified: Secondary | ICD-10-CM | POA: Diagnosis not present

## 2017-07-12 DIAGNOSIS — K219 Gastro-esophageal reflux disease without esophagitis: Secondary | ICD-10-CM | POA: Diagnosis present

## 2017-07-12 DIAGNOSIS — R12 Heartburn: Secondary | ICD-10-CM | POA: Diagnosis not present

## 2017-07-12 DIAGNOSIS — E1122 Type 2 diabetes mellitus with diabetic chronic kidney disease: Secondary | ICD-10-CM | POA: Diagnosis not present

## 2017-07-12 DIAGNOSIS — E039 Hypothyroidism, unspecified: Secondary | ICD-10-CM | POA: Diagnosis not present

## 2017-07-12 DIAGNOSIS — I5032 Chronic diastolic (congestive) heart failure: Secondary | ICD-10-CM | POA: Diagnosis present

## 2017-07-12 DIAGNOSIS — Z452 Encounter for adjustment and management of vascular access device: Secondary | ICD-10-CM | POA: Diagnosis not present

## 2017-07-12 DIAGNOSIS — A419 Sepsis, unspecified organism: Secondary | ICD-10-CM | POA: Diagnosis not present

## 2017-07-12 DIAGNOSIS — Z940 Kidney transplant status: Secondary | ICD-10-CM | POA: Diagnosis not present

## 2017-07-12 DIAGNOSIS — J398 Other specified diseases of upper respiratory tract: Secondary | ICD-10-CM | POA: Diagnosis present

## 2017-07-12 DIAGNOSIS — G8929 Other chronic pain: Secondary | ICD-10-CM | POA: Diagnosis present

## 2017-07-12 DIAGNOSIS — Z992 Dependence on renal dialysis: Secondary | ICD-10-CM | POA: Diagnosis not present

## 2017-07-12 DIAGNOSIS — R945 Abnormal results of liver function studies: Secondary | ICD-10-CM | POA: Diagnosis not present

## 2017-07-12 DIAGNOSIS — E785 Hyperlipidemia, unspecified: Secondary | ICD-10-CM | POA: Diagnosis not present

## 2017-07-12 DIAGNOSIS — R110 Nausea: Secondary | ICD-10-CM | POA: Diagnosis not present

## 2017-07-12 DIAGNOSIS — K7689 Other specified diseases of liver: Secondary | ICD-10-CM | POA: Diagnosis present

## 2017-07-12 DIAGNOSIS — R509 Fever, unspecified: Secondary | ICD-10-CM | POA: Diagnosis not present

## 2017-07-12 DIAGNOSIS — F40240 Claustrophobia: Secondary | ICD-10-CM | POA: Diagnosis present

## 2017-07-12 DIAGNOSIS — I2510 Atherosclerotic heart disease of native coronary artery without angina pectoris: Secondary | ICD-10-CM | POA: Diagnosis present

## 2017-07-12 DIAGNOSIS — E1043 Type 1 diabetes mellitus with diabetic autonomic (poly)neuropathy: Secondary | ICD-10-CM | POA: Diagnosis present

## 2017-07-12 DIAGNOSIS — E8889 Other specified metabolic disorders: Secondary | ICD-10-CM | POA: Diagnosis present

## 2017-07-12 DIAGNOSIS — K3184 Gastroparesis: Secondary | ICD-10-CM | POA: Diagnosis present

## 2017-07-12 DIAGNOSIS — R531 Weakness: Secondary | ICD-10-CM | POA: Diagnosis not present

## 2017-07-12 DIAGNOSIS — D631 Anemia in chronic kidney disease: Secondary | ICD-10-CM | POA: Diagnosis not present

## 2017-07-12 DIAGNOSIS — R1010 Upper abdominal pain, unspecified: Secondary | ICD-10-CM | POA: Diagnosis not present

## 2017-07-12 DIAGNOSIS — E1022 Type 1 diabetes mellitus with diabetic chronic kidney disease: Secondary | ICD-10-CM | POA: Diagnosis not present

## 2017-07-12 DIAGNOSIS — I120 Hypertensive chronic kidney disease with stage 5 chronic kidney disease or end stage renal disease: Secondary | ICD-10-CM | POA: Diagnosis not present

## 2017-07-12 DIAGNOSIS — I132 Hypertensive heart and chronic kidney disease with heart failure and with stage 5 chronic kidney disease, or end stage renal disease: Secondary | ICD-10-CM | POA: Diagnosis present

## 2017-07-12 DIAGNOSIS — N2581 Secondary hyperparathyroidism of renal origin: Secondary | ICD-10-CM | POA: Diagnosis not present

## 2017-07-12 HISTORY — DX: Type 1 diabetes mellitus without complications: E10.9

## 2017-07-12 LAB — CBC WITH DIFFERENTIAL/PLATELET
Basophils Absolute: 0 10*3/uL (ref 0.0–0.1)
Basophils Relative: 0 %
Eosinophils Absolute: 0.3 10*3/uL (ref 0.0–0.7)
Eosinophils Relative: 2 %
HCT: 33.2 % — ABNORMAL LOW (ref 39.0–52.0)
Hemoglobin: 10.3 g/dL — ABNORMAL LOW (ref 13.0–17.0)
Lymphocytes Relative: 4 %
Lymphs Abs: 0.7 10*3/uL (ref 0.7–4.0)
MCH: 34 pg (ref 26.0–34.0)
MCHC: 31 g/dL (ref 30.0–36.0)
MCV: 109.6 fL — ABNORMAL HIGH (ref 78.0–100.0)
Monocytes Absolute: 0.5 10*3/uL (ref 0.1–1.0)
Monocytes Relative: 3 %
Neutro Abs: 14.3 10*3/uL — ABNORMAL HIGH (ref 1.7–7.7)
Neutrophils Relative %: 91 %
Platelets: 243 10*3/uL (ref 150–400)
RBC: 3.03 MIL/uL — ABNORMAL LOW (ref 4.22–5.81)
RDW: 14.6 % (ref 11.5–15.5)
WBC: 15.7 10*3/uL — ABNORMAL HIGH (ref 4.0–10.5)

## 2017-07-12 LAB — COMPREHENSIVE METABOLIC PANEL
ALT: 116 U/L — ABNORMAL HIGH (ref 17–63)
AST: 177 U/L — ABNORMAL HIGH (ref 15–41)
Albumin: 3.1 g/dL — ABNORMAL LOW (ref 3.5–5.0)
Alkaline Phosphatase: 142 U/L — ABNORMAL HIGH (ref 38–126)
Anion gap: 16 — ABNORMAL HIGH (ref 5–15)
BUN: 22 mg/dL — ABNORMAL HIGH (ref 6–20)
CO2: 24 mmol/L (ref 22–32)
Calcium: 8.4 mg/dL — ABNORMAL LOW (ref 8.9–10.3)
Chloride: 96 mmol/L — ABNORMAL LOW (ref 101–111)
Creatinine, Ser: 5.4 mg/dL — ABNORMAL HIGH (ref 0.61–1.24)
GFR calc Af Amer: 13 mL/min — ABNORMAL LOW (ref >60)
GFR calc non Af Amer: 11 mL/min — ABNORMAL LOW (ref >60)
Glucose, Bld: 143 mg/dL — ABNORMAL HIGH (ref 65–99)
Potassium: 4.1 mmol/L (ref 3.5–5.1)
Sodium: 136 mmol/L (ref 135–145)
Total Bilirubin: 2 mg/dL — ABNORMAL HIGH (ref 0.3–1.2)
Total Protein: 7.1 g/dL (ref 6.5–8.1)

## 2017-07-12 LAB — INFLUENZA PANEL BY PCR (TYPE A & B)
Influenza A By PCR: NEGATIVE
Influenza B By PCR: NEGATIVE

## 2017-07-12 LAB — GLUCOSE, CAPILLARY: Glucose-Capillary: 239 mg/dL — ABNORMAL HIGH (ref 65–99)

## 2017-07-12 LAB — TROPONIN I: Troponin I: <0.03 ng/mL (ref <0.03)

## 2017-07-12 LAB — I-STAT CG4 LACTIC ACID, ED
Lactic Acid, Venous: 1.87 mmol/L (ref 0.5–1.9)
Lactic Acid, Venous: 1.09 mmol/L (ref 0.5–1.9)

## 2017-07-12 LAB — CBG MONITORING, ED: Glucose-Capillary: 133 mg/dL — ABNORMAL HIGH (ref 65–99)

## 2017-07-12 MED ORDER — ACETAMINOPHEN 650 MG RE SUPP: 650.0000 mg | Freq: Four times a day (QID) | RECTAL | Status: DC | PRN

## 2017-07-12 MED ORDER — POLYETHYLENE GLYCOL 3350 17 G PO PACK: 17.0000 g | PACK | Freq: Every day | ORAL | Status: DC | PRN

## 2017-07-12 MED ORDER — HYDROMORPHONE HCL 2 MG PO TABS
2.0000 mg | ORAL_TABLET | Freq: Two times a day (BID) | ORAL | Status: DC | PRN
  Administered 2017-07-12 – 2017-07-13 (×2): 2 mg via ORAL
  Filled 2017-07-12 (×2): qty 1

## 2017-07-12 MED ORDER — LEVOTHYROXINE SODIUM 112 MCG PO TABS
112.0000 ug | ORAL_TABLET | ORAL | Status: DC
  Administered 2017-07-12 – 2017-07-17 (×6): 112 ug via ORAL
  Filled 2017-07-12 (×7): qty 1

## 2017-07-12 MED ORDER — FENOFIBRATE 54 MG PO TABS
54.0000 mg | ORAL_TABLET | Freq: Every day | ORAL | Status: DC
  Administered 2017-07-12 – 2017-07-15 (×4): 54 mg via ORAL
  Filled 2017-07-12 (×4): qty 1

## 2017-07-12 MED ORDER — PANTOPRAZOLE SODIUM 40 MG PO TBEC
40.0000 mg | DELAYED_RELEASE_TABLET | Freq: Every day | ORAL | Status: DC
  Administered 2017-07-12 – 2017-07-15 (×4): 40 mg via ORAL
  Filled 2017-07-12 (×4): qty 1

## 2017-07-12 MED ORDER — POLYVINYL ALCOHOL 1.4 % OP SOLN: 1.0000 [drp] | OPHTHALMIC | Status: DC | PRN

## 2017-07-12 MED ORDER — ROPINIROLE HCL 1 MG PO TABS
2.0000 mg | ORAL_TABLET | Freq: Every day | ORAL | Status: DC
  Administered 2017-07-12 – 2017-07-17 (×6): 2 mg via ORAL
  Filled 2017-07-12 (×7): qty 2

## 2017-07-12 MED ORDER — VANCOMYCIN HCL 10 G IV SOLR
2500.0000 mg | Freq: Two times a day (BID) | INTRAVENOUS | Status: DC
  Administered 2017-07-12 – 2017-07-13 (×2): 2500 mg via INTRAVENOUS
  Filled 2017-07-12 (×4): qty 2500

## 2017-07-12 MED ORDER — HEPARIN SODIUM (PORCINE) 5000 UNIT/ML IJ SOLN
5000.0000 [IU] | Freq: Three times a day (TID) | INTRAMUSCULAR | Status: DC
  Administered 2017-07-12 – 2017-07-17 (×13): 5000 [IU] via SUBCUTANEOUS
  Filled 2017-07-12 (×18): qty 1

## 2017-07-12 MED ORDER — INSULIN ASPART 100 UNIT/ML ~~LOC~~ SOLN
0.0000 [IU] | SUBCUTANEOUS | Status: DC
  Administered 2017-07-12: 5 [IU] via SUBCUTANEOUS
  Administered 2017-07-12: 2 [IU] via SUBCUTANEOUS
  Administered 2017-07-13: 4 [IU] via SUBCUTANEOUS
  Administered 2017-07-13: 8 [IU] via SUBCUTANEOUS
  Administered 2017-07-13: 3 [IU] via SUBCUTANEOUS
  Administered 2017-07-13: 5 [IU] via SUBCUTANEOUS
  Filled 2017-07-12: qty 1

## 2017-07-12 MED ORDER — ACETAMINOPHEN 325 MG PO TABS: 650.0000 mg | ORAL_TABLET | Freq: Four times a day (QID) | ORAL | Status: DC | PRN

## 2017-07-12 MED ORDER — AZTREONAM 1 G IJ SOLR
1.0000 g | Freq: Once | INTRAMUSCULAR | Status: AC
  Administered 2017-07-12: 1 g via INTRAVENOUS
  Filled 2017-07-12: qty 1

## 2017-07-12 MED ORDER — DEXTROSE 5 % IV SOLN
0.5000 g | Freq: Two times a day (BID) | INTRAVENOUS | Status: DC
  Administered 2017-07-13 (×2): 0.5 g via INTRAVENOUS
  Filled 2017-07-12 (×3): qty 0.5

## 2017-07-12 MED ORDER — ATORVASTATIN CALCIUM 40 MG PO TABS
40.0000 mg | ORAL_TABLET | Freq: Every day | ORAL | Status: DC
  Administered 2017-07-12 – 2017-07-14 (×3): 40 mg via ORAL
  Filled 2017-07-12 (×3): qty 1

## 2017-07-12 MED ORDER — VANCOMYCIN HCL IN DEXTROSE 1-5 GM/200ML-% IV SOLN
1000.0000 mg | INTRAVENOUS | Status: DC
  Filled 2017-07-12: qty 200

## 2017-07-12 MED ORDER — ONDANSETRON HCL 4 MG/2ML IJ SOLN
4.0000 mg | Freq: Three times a day (TID) | INTRAMUSCULAR | Status: DC | PRN
  Administered 2017-07-12 – 2017-07-13 (×2): 4 mg via INTRAVENOUS
  Filled 2017-07-12 (×2): qty 2

## 2017-07-12 MED ORDER — POLYETHYL GLYCOL-PROPYL GLYCOL 0.4-0.3 % OP SOLN: 1.0000 [drp] | OPHTHALMIC | Status: DC | PRN

## 2017-07-12 MED ORDER — ENOXAPARIN SODIUM 30 MG/0.3ML ~~LOC~~ SOLN: 30.0000 mg | SUBCUTANEOUS | Status: DC

## 2017-07-12 MED ORDER — PRASUGREL HCL 10 MG PO TABS
10.0000 mg | ORAL_TABLET | Freq: Every day | ORAL | Status: DC
  Administered 2017-07-12 – 2017-07-18 (×6): 10 mg via ORAL
  Filled 2017-07-12 (×7): qty 1

## 2017-07-12 MED ORDER — DOCUSATE SODIUM 100 MG PO CAPS
200.0000 mg | ORAL_CAPSULE | Freq: Two times a day (BID) | ORAL | Status: DC
  Administered 2017-07-12: 200 mg via ORAL
  Filled 2017-07-12 (×2): qty 2

## 2017-07-12 MED ORDER — SODIUM CHLORIDE 0.9 % IV BOLUS (SEPSIS)
500.0000 mL | Freq: Once | INTRAVENOUS | Status: AC
  Administered 2017-07-12: 500 mL via INTRAVENOUS

## 2017-07-12 MED ORDER — GABAPENTIN 300 MG PO CAPS
300.0000 mg | ORAL_CAPSULE | Freq: Two times a day (BID) | ORAL | Status: DC
  Administered 2017-07-12 – 2017-07-13 (×3): 300 mg via ORAL
  Filled 2017-07-12 (×3): qty 1

## 2017-07-12 MED ORDER — ASPIRIN EC 81 MG PO TBEC
81.0000 mg | DELAYED_RELEASE_TABLET | Freq: Every day | ORAL | Status: DC
  Administered 2017-07-12 – 2017-07-17 (×6): 81 mg via ORAL
  Filled 2017-07-12 (×6): qty 1

## 2017-07-12 MED FILL — Lidocaine HCl Local Inj 1%: INTRAMUSCULAR | Qty: 20 | Status: AC

## 2017-07-12 NOTE — ED Triage Notes (Signed)
Pt with hx of ESRD from dialysis for lethargy, AMS, and fever. Per EMS, pt had procedure done yesterday on his fistula and has "not felt well since." Per dialysis staff, pt did not receive full treatment and seemed more confused than normal. Pt given 1g Tylenol en route for temperature of 103 degrees. Pt BP 87/39 on EMS arrival, 250 cc bolus given en route, BP improved to 101/33. Per EMS, pt able to answer Qs appropriately. A&Ox4. EMS VS: 103 degrees, 101/33, 142 CBG.

## 2017-07-12 NOTE — ED Notes (Signed)
Attempted report x1. 

## 2017-07-12 NOTE — Progress Notes (Signed)
Pharmacy Antibiotic Note  Dale [name] is a 52 y.o. male admitted on 07/12/2017 with sepsis.  HD pt came from session today - incomplete treatment  Plan: Aztreonam 1 g x 1 then 500 mg q12h Cancel lvq Vanc 2500 mg x 1 then 1g with HD (May need adjustment per Hd schedule) Monitor HD schedule, cx, vanc lvls prn Consider change from aztreonam to ceftaz on admit d/t hx of tolerating  Height: 5\' 8"  (172.7 cm) Weight: 283 lb (128.4 kg) IBW/kg (Calculated) : 68.4  Temp (24hrs), Avg:99.1 F (37.3 C), Min:99.1 F (37.3 C), Max:99.1 F (37.3 C)  Recent Labs  Lab 07/11/17 0629 07/12/17 1149 07/12/17 1222  WBC  --  15.7*  --   CREATININE 8.40*  --   --   LATICACIDVEN  --   --  1.87    Estimated Creatinine Clearance: 13.6 mL/min (A) (by C-G formula based on SCr of 8.4 mg/dL (H)).    Allergies  Allergen Reactions  . Iodinated Diagnostic Agents Itching, Rash and Other (See Comments)    Systemic itching and transient rash appearance within hours of receiving contrast    . Prednisone Other (See Comments)    Increases sugar levels  . Adhesive [Tape] Itching and Other (See Comments)    Blisters on arm from adhesive tape at dialysis   . Amoxicillin Itching, Rash and Other (See Comments)    Has patient had a PCN reaction causing immediate rash, facial/tongue/throat swelling, SOB or lightheadedness with hypotension: Yes Has patient had a PCN reaction causing severe rash involving mucus membranes or skin necrosis: No Has patient had a PCN reaction that required hospitalization No Has patient had a PCN reaction occurring within the last 10 years: No If all of the above answers are "NO", then may proceed with Cephalosporin use.  . Clindamycin/Lincomycin Hives and Itching  . Enalapril Rash and Cough  . Mobic [Meloxicam] Hives and Rash  . Morphine Hives, Nausea Only and Rash  . Brilinta [Ticagrelor] Nausea And Vomiting  . Ciprofloxacin Itching and Rash  . Clopidogrel Rash  . Codeine  Hives, Itching and Rash  . Doxycycline Rash  . Hydrocodone Rash  . Levofloxacin Hives and Itching  . Oxycodone Rash  . Rocephin [Ceftriaxone] Itching   Levester Fresh, PharmD, BCPS, BCCCP Clinical Pharmacist Clinical phone for 07/12/2017 from 7a-3:30p: [phone] If after 3:30p, please call main pharmacy at: [phone] 07/12/2017 12:32 PM

## 2017-07-12 NOTE — ED Provider Notes (Signed)
[facility] EMERGENCY DEPARTMENT Provider Note   CSN: [id number] Arrival date & time: 07/12/17  1134     History   Chief Complaint Chief Complaint  Patient presents with  . Code Sepsis    HPI Dale [name] is a 52 y.o. male.  Pt presents to the ED today with fever and altered mental status.  The pt is a dialysis patient.  He had a fistulagram done yesterday by Dr. Donzetta Matters.  He is allergic to IV dye, but was pretreated with solumedrol 125 mg, benadryl 50 mg, and pepcid 20 mg.  The pt has not felt well since that procedure.  Pt did go to dialysis today, but did not receive the full treatment b/c he had a loc and was more confused than normal.  He did have a fever of 103 and was given 1 g of tylenol en route.  BP 87/39 by EMS and was given 250 cc bolus.  The pt c/o chronic tailbone pain, but no new pain.  He does feel sob, but has not had a cough.  He said he does not feel right.  He urinates a miniscule amount.       Past Medical History:  Diagnosis Date  . Anal fissure   . Anemia of chronic disease   . Arthritis    "hands, right knee" (03/19/2014) no cartlidge in right knee  . CAD (coronary artery disease)    a. Abnl nuc ->LHC (11/15):  mid to dist Dx 80%, mid RCA 99% (functional CTO), dist RCA 80% >> PCI: balloon angioplasty to mid RCA (could not deliver stent) - staged PCI Rotoblator atherectomy/DES to [facility]. b. NSTEMI 12/2015 s/p DES to diagonal.  . Cholecystitis    a. 08/27/2011  . Chronic diastolic CHF (congestive heart failure) ([place])    a. Echo 3/13:  EF 55-60%;  b. Echo (11/15):  Mild LVH, EF 60-65%, no RWMA, Gr 1 DD, MAC, mild LAE, normal RVF, mild RAE;  c.  Echo 5/16:  severe LVH, EF 55-60%, no RWMA, Gr 1 DD, MAC, mild LAE  . Claustrophobia    when things get around his face.   . Complication of anesthesia   . Difficult intubation    only once in 2015 at [facility] haven't had a problem since then  . Diverticulosis   . ESRD (end stage renal disease)  ([place])    a. 1995 s/p cadaveric transplant w/ susbequent failure after 18 yrs;  b.Dialysis initiated 07/2011 [facility] M-W[facility]F  . Fibromyalgia   . Gastroparesis   . GERD (gastroesophageal reflux disease)   . Gout    PMH  . History of blood transfusion   . HLD (hyperlipidemia)   . Hypertension   . Hypothyroidism   . Inguinal lymphadenopathy    a. bilateral - s/p biopsy 07/2011  . Insulin dependent diabetes mellitus (HCC)    Type I  . Monilial esophagitis ([place])   . Morbid obesity ([place])   . OSA (obstructive sleep apnea)    adjustable bed  . PAD (peripheral artery disease) (HCC)    a. s/p L BKA.  [name] Kitchen Pericardial effusion    a.  Small by CT 08/22/11;  b.  Large by CT 08/27/11. c. Not seen on 11/2015 echo.  . Pneumonia ~ 2007  . PONV (postoperative nausea and vomiting)    vomited once after a procedure.  . Tracheobronchomalacia     Patient Active Problem List   Diagnosis Date Noted  . Sepsis (  [place]) 07/12/2017  . Hypotension (arterial) 10/15/2016  . Heme positive stool   . IDDM with complications 46/96/2952  . Oxygen dependent 03/24/2016  . Visit for preventive health examination 03/24/2016  . Chronic diastolic heart failure ([place]) 02/25/2016  . Tracheobronchomalacia 02/04/2016  . Anemia of chronic disease   . Iron deficiency anemia   . Gastrointestinal hemorrhage with melena 12/21/2015  . Pulmonary edema 03/09/2015  . Chronic pain 03/09/2015  . HLD (hyperlipidemia) 11/27/2014  . ESRD on dialysis ([name]) 09/30/2014  . Coronary artery disease involving native coronary artery of native heart with unstable angina pectoris ([place]) 03/20/2014  . Allergic reaction to dye 03/20/2014  . NSTEMI (non-ST elevated myocardial infarction) ([place]) 03/19/2014  . HTN (hypertension) 02/18/2014  . Pericardial effusion 08/27/2011  . History of renal transplant 07/22/2011  . S/P BKA (below knee amputation) ([place]) 07/22/2011  . OSA (obstructive sleep apnea) 07/22/2011    Past Surgical History:  Procedure  Laterality Date  . A/V FISTULAGRAM Left 07/26/2016   Procedure: A/V Fistulagram;  Surgeon: Waynetta Sandy, MD;  Location: [place] CV LAB;  Service: Cardiovascular;  Laterality: Left;  . A/V FISTULAGRAM N/A 07/11/2017   Procedure: A/V FISTULAGRAM - Left AV;  Surgeon: Waynetta Sandy, MD;  Location: [place] CV LAB;  Service: Cardiovascular;  Laterality: N/A;  . AMPUTATION OF REPLICATED TOES Right   . ANGIOPLASTY  04/09/2014   RCA   . APPENDECTOMY  ~ 2006  . ARTERIOVENOUS GRAFT PLACEMENT Left 1993?   forearm  . ARTERIOVENOUS GRAFT PLACEMENT Right 1993?   leg  . ARTERIOVENOUS GRAFT PLACEMENT Left    Thigh  . Arteriovenous Graft Removed Left    Thigh  . AV FISTULA PLACEMENT  07/29/2011   Procedure: ARTERIOVENOUS (AV) FISTULA CREATION;  Surgeon: Mal Misty, MD;  Location: [place];  Service: Vascular;  Laterality: Right;  Brachial cephalic  . AV FISTULA PLACEMENT Left 01/20/2015   Procedure: LIGATION OF RIGHT ARTERIOVENOUS FISTULA WITH EXCISION OF ANEURYSM;  Surgeon: Angelia Mould, MD;  Location: [place];  Service: Vascular;  Laterality: Left;  . AV FISTULA PLACEMENT Left 04/30/2015   Procedure: CREATION OF LEFT ARM BRACHIO-CEPHALIC ARTERIOVENOUS (AV) FISTULA ;  Surgeon: Angelia Mould, MD;  Location: [place];  Service: Vascular;  Laterality: Left;  . AV Graft PLACEMENT Left 1993?   "attempted one in my wrist; didn't take"  . BELOW KNEE LEG AMPUTATION Left 2010  . CARDIAC CATHETERIZATION  03/19/2014  . CARDIAC CATHETERIZATION  03/19/2014   Procedure: CORONARY BALLOON ANGIOPLASTY;  Surgeon: Burnell Blanks, MD;  Location: [facility] CATH LAB;  Service: Cardiovascular;;  . CARDIAC CATHETERIZATION N/A 01/01/2016   Procedure: Left Heart Cath and Coronary Angiography;  Surgeon: Troy Sine, MD;  Location: [place] CV LAB;  Service: Cardiovascular;  Laterality: N/A;  . CARDIAC CATHETERIZATION N/A 01/01/2016   Procedure: Coronary Stent Intervention;  Surgeon:  Troy Sine, MD;  Location: [place] CV LAB;  Service: Cardiovascular;  Laterality: N/A;  . CARPAL TUNNEL RELEASE Bilateral after 2005  . CATARACT EXTRACTION W/ INTRAOCULAR LENS  IMPLANT, BILATERAL Bilateral 1990's  . COLONOSCOPY  08/29/2011   Procedure: COLONOSCOPY;  Surgeon: Jerene Bears, MD;  Location: [place];  Service: Gastroenterology;  Laterality: N/A;  . COLONOSCOPY WITH PROPOFOL N/A 04/26/2016   Procedure: COLONOSCOPY WITH PROPOFOL;  Surgeon: Jerene Bears, MD;  Location: [facility] ENDOSCOPY;  Service: Gastroenterology;  Laterality: N/A;  . CORONARY ANGIOPLASTY WITH STENT PLACEMENT  04/09/2014  ptca/des mid lad   . ESOPHAGOGASTRODUODENOSCOPY N/A 12/25/2015   Procedure: ESOPHAGOGASTRODUODENOSCOPY (EGD);  Surgeon: Mauri Pole, MD;  Location: [facility] ENDOSCOPY;  Service: Endoscopy;  Laterality: N/A;  bedside  . ESOPHAGOGASTRODUODENOSCOPY (EGD) WITH PROPOFOL N/A 04/26/2016   Procedure: ESOPHAGOGASTRODUODENOSCOPY (EGD) WITH PROPOFOL;  Surgeon: Jerene Bears, MD;  Location: [facility] ENDOSCOPY;  Service: Gastroenterology;  Laterality: N/A;  . EYE SURGERY Bilateral    cataract  . FEMORAL ARTERY REPAIR  04/09/2014   ANGIOSEAL  . INSERTION OF DIALYSIS CATHETER  08/01/2011   Procedure: INSERTION OF DIALYSIS CATHETER;  Surgeon: Rosetta Posner, MD;  Location: [place];  Service: Vascular;  Laterality: Right;  insertion of dialysis catheter on right internal jugular vein  . INSERTION OF DIALYSIS CATHETER Right 01/20/2015   Procedure: INSERTION OF DIALYSIS CATHETER - RIGHT INTERNAL JUGULAR ;  Surgeon: Angelia Mould, MD;  Location: [place];  Service: Vascular;  Laterality: Right;  . KIDNEY TRANSPLANT  04/25/1993  . LEFT HEART CATHETERIZATION WITH CORONARY ANGIOGRAM N/A 03/19/2014   Procedure: LEFT HEART CATHETERIZATION WITH CORONARY ANGIOGRAM;  Surgeon: Burnell Blanks, MD;  Location: [facility] C[facility] LAB;  Service: Cardiovascular;  Laterality: N/A;  . LYMPH NODE BIOPSY  08/10/2011   Procedure: LYMPH NODE  BIOPSY;  Surgeon: Merrie Roof, MD;  Location: [place];  Service: General;  Laterality: Left;  left groin lymph biopsy  . NEUROPLASTY / TRANSPOSITION ULNAR NERVE AT ELBOW Left after 2005  . PATCH ANGIOPLASTY Left 08/09/2016   Procedure: LEFT  ARTERIOVENOUS FISTULA PATCH ANGIOPLASTY;  Surgeon: Waynetta Sandy, MD;  Location: [place];  Service: Vascular;  Laterality: Left;  . PERCUTANEOUS CORONARY ROTOBLATOR INTERVENTION (PCI-R) N/A 04/09/2014   Procedure: PERCUTANEOUS CORONARY ROTOBLATOR INTERVENTION (PCI-R);  Surgeon: Burnell Blanks, MD;  Location: [facility] CATH LAB;  Service: Cardiovascular;  Laterality: N/A;  . PERIPHERAL VASCULAR BALLOON ANGIOPLASTY  07/11/2017   Procedure: PERIPHERAL VASCULAR BALLOON ANGIOPLASTY;  Surgeon: Waynetta Sandy, MD;  Location: [place] CV LAB;  Service: Cardiovascular;;  left arm AV fistula  . PERIPHERAL VASCULAR CATHETERIZATION N/A 12/25/2014   Procedure: Upper Extremity Venography;  Surgeon: Conrad Holyrood, MD;  Location: [place] CV LAB;  Service: Cardiovascular;  Laterality: N/A;  . PERIPHERAL VASCULAR CATHETERIZATION Left 02/24/2016   Procedure: Fistulagram;  Surgeon: Waynetta Sandy, MD;  Location: [place] CV LAB;  Service: Cardiovascular;  Laterality: Left;  . PERIPHERAL VASCULAR CATHETERIZATION Left 02/24/2016   Procedure: Peripheral Vascular Intervention;  Surgeon: Waynetta Sandy, MD;  Location: [place] CV LAB;  Service: Cardiovascular;  Laterality: Left;  AV FISTULA  . PERITONEAL CATHETER INSERTION    . PERITONEAL CATHETER REMOVAL    . REVISON OF ARTERIOVENOUS FISTULA Right 8/41/6606   Procedure: PLICATION / REVISION OF ARTERIOVENOUS FISTULA;  Surgeon: Angelia Mould, MD;  Location: [place];  Service: Vascular;  Laterality: Right;  . REVISON OF ARTERIOVENOUS FISTULA Left 09/01/2015   Procedure: SUPERFICIALIZATION OF LEFT ARM BRACHIOCEPHALIC ARTERIOVENOUS FISTULA;  Surgeon: Angelia Mould, MD;   Location: [place];  Service: Vascular;  Laterality: Left;  . REVISON OF ARTERIOVENOUS FISTULA Left 08/09/2016   Procedure: REVISION OF ARTERIAL ANASTOMOSIS OF ARTERIOVENOUS FISTULA;  Surgeon: Waynetta Sandy, MD;  Location: [place];  Service: Vascular;  Laterality: Left;  . SHOULDER ARTHROSCOPY Left ~ 2006   frozen  . TOE AMPUTATION Bilateral after 2005  . UMBILICAL HERNIA REPAIR  1990's  . VENOGRAM Right 07/28/2011   Procedure: VENOGRAM;  Surgeon: Conrad Cooper, MD;  Location:  [place] CATH LAB;  Service: Cardiovascular;  Laterality: Right;  . VITRECTOMY Bilateral        Home Medications    Prior to Admission medications   Medication Sig Start Date End Date Taking? Authorizing Provider  acetaminophen (TYLENOL) 500 MG tablet Take 1,000 mg by mouth every 4 (four) hours as needed for moderate pain or headache.  01/16/16   [provider]  allopurinol (ZYLOPRIM) 100 MG tablet TAKE 2 TABLETS BY MOUTH EVERY DAY Patient taking differently: TAKE 200 MG BY MOUTH EVERY DAY 03/30/17   Mercy Riding, MD  aspirin EC 81 MG tablet Take 81 mg by mouth at bedtime.    [provider]  atorvastatin (LIPITOR) 40 MG tablet TAKE 1 TABLET(40 MG) BY MOUTH DAILY 12/20/16   Mercy Riding, MD  calcium acetate (PHOSLO) 667 MG capsule Take 667-1,334 mg by mouth See admin instructions. Take 1341ms with meals and 6654m with snacks 09/19/12   [provider]  cetirizine (ZYRTEC) 10 MG tablet Take 10 mg by mouth daily.    [provider]  docusate sodium (COLACE) 100 MG capsule Take 200 mg by mouth 2 (two) times daily.     [provider]  fenofibrate (TRICOR) 48 MG tablet Take 48 mg by mouth daily.     [provider]  fluticasone (FLONASE) 50 MCG/ACT nasal spray Place 2 sprays into both nostrils daily as needed for allergies.     [provider]  FOSRENOL 1000 MG PACK Take 1,000 mg by mouth 3 (three) times daily.  09/14/16   [provider]  gabapentin  (NEURONTIN) 300 MG capsule Take 300 mg by mouth 3 (three) times daily.    [provider]  GLUCAGON EMERGENCY 1 MG injection Inject 1 mg into the muscle daily as needed (low blood sugar).  01/19/12   [provider]  HYDROmorphone (DILAUDID) 2 MG tablet Take 2 mg by mouth 2 (two) times daily.     [provider]  hydrOXYzine (ATARAX/VISTARIL) 10 MG tablet Take 10 mg by mouth every 8 (eight) hours as needed for anxiety or vomiting.     [provider]  insulin lispro (HUMALOG) 100 UNIT/ML injection Inject 1-20 Units into the skin 3 (three) times daily. Blood glucose - 120, 1 unit for every 20 points over 120 ,  PT RECEIVES FROM INSULIN PUMP    [provider]  levothyroxine (SYNTHROID, LEVOTHROID) 112 MCG tablet Take 112 mcg by mouth every evening. 2 hours after dinner    [provider]  metoprolol tartrate (LOPRESSOR) 25 MG tablet Take 1 tablet (25 mg total) by mouth 2 (two) times daily. 03/29/17 07/11/17  McBurnell BlanksMD  midodrine (PROAMATINE) 10 MG tablet Take 1 tablet (10 mg total) by mouth 3 (three) times daily. Takes 1 tab in mornings of dialysis and takes 2 tabs 30 mins before starting dialysis (m,w,f) 04/14/16   Elgergawy, DaSilver HugueninMD  multivitamin (RENA-VIT) TABS tablet Take 1 tablet by mouth daily.    [provider]  mupirocin ointment (BACTROBAN) 2 % Place 1 application into the nose 2 (two) times daily. 10/16/16   Elgergawy, DaSilver HugueninMD  nitroGLYCERIN (NITROSTAT) 0.4 MG SL tablet Place 1 tablet (0.4 mg total) under the tongue every 5 (five) minutes as needed for chest pain. 02/15/16   McBurnell BlanksMD  Nutritional Supplements (FEEDING SUPPLEMENT, NEPRO CARB STEADY,) LIQD Take 237 mLs by mouth daily. 01/04/16   XuFlorencia ReasonsMD  omeprazole (PRBrevig [place]  OTC) 20 MG tablet Take 20 mg by mouth daily.    [provider]  OXYGEN Inhale 3 L into the lungs continuous.     [provider]  Polyethyl  Glycol-Propyl Glycol (SYSTANE) 0.4-0.3 % SOLN Place 1 drop into both eyes as needed (dry eyes).    [provider]  polyethylene glycol (MIRALAX / GLYCOLAX) packet Take 17 g by mouth daily. Patient taking differently: Take 17 g by mouth daily as needed for mild constipation.  02/06/16   Regalado, Belkys A, MD  prasugrel (EFFIENT) 10 MG TABS tablet TAKE ONE TABLET BY MOUTH EVERY DAY Patient taking differently: TAKE 10 MG BY MOUTH EVERY DAY 03/09/17   Eileen Stanford, PA-C  promethazine (PHENERGAN) 25 MG tablet Take 1 tablet (25 mg total) by mouth every 6 (six) hours as needed for nausea or vomiting. For nausea 11/27/14   Richardson Dopp T, PA-C  rOPINIRole (REQUIP) 2 MG tablet TAKE 1 TABLET(2 MG) BY MOUTH EVERY NIGHT 05/05/17   Mercy Riding, MD  testosterone cypionate (DEPOTESTOSTERONE CYPIONATE) 200 MG/ML injection INJECT 200 MG INTO THE MUSCLE EVERY 14 DAYS 09/10/16   [provider]  VELPHORO 500 MG chewable tablet  11/15/16   [provider]  Wound Cleansers (WOUND [place]) LIQD Apply 1 application topically daily as needed (wound care).    [provider]    Family History Family History  Problem Relation Age of Onset  . Colon polyps Father   . Diabetes Father   . Hypertension Father   . Hypertension Mother   . Diabetes Mother   . Hyperlipidemia Mother   . Breast cancer Maternal Aunt   . Bone cancer Maternal Aunt   . Liver cancer Maternal Aunt   . Malignant hyperthermia Neg Hx   . Heart attack Neg Hx   . Stroke Neg Hx   . Colon cancer Neg Hx   . Stomach cancer Neg Hx   . Pancreatic cancer Neg Hx   . Esophageal cancer Neg Hx     Social History Social History   Tobacco Use  . Smoking status: Never Smoker  . Smokeless tobacco: Never Used  Substance Use Topics  . Alcohol use: Yes    Comment: occasional  . Drug use: No     Allergies   Iodinated diagnostic agents; Prednisone; Adhesive [tape]; Amoxicillin; Clindamycin/lincomycin; Enalapril;  Mobic [meloxicam]; Morphine; Brilinta [ticagrelor]; Ciprofloxacin; Clopidogrel; Codeine; Doxycycline; Hydrocodone; Levofloxacin; Oxycodone; and Rocephin [ceftriaxone]   Review of Systems Review of Systems  Constitutional: Positive for fatigue and fever.  All other systems reviewed and are negative.    Physical Exam Updated Vital Signs BP (!) 90/37   Pulse 90   Temp (S) (!) 100.7 F (38.2 C) (Rectal)   Resp (!) 21   Ht _0  (1.727 m)   Wt 128.4 kg (283 lb)   SpO2 96%   BMI 43.03 kg/m   Physical Exam  Constitutional: He is oriented to person, place, and time. He appears well-developed and well-nourished.  HENT:  Head: Normocephalic and atraumatic.  Right Ear: External ear normal.  Left Ear: External ear normal.  Nose: Nose normal.  Mouth/Throat: Oropharynx is clear and moist.  Eyes: Conjunctivae and EOM are normal. Pupils are equal, round, and reactive to light.  Neck: Normal range of motion. Neck supple.  Cardiovascular: Tachycardia present.  Pulmonary/Chest: Effort normal and breath sounds normal.  Abdominal: Soft. Bowel sounds are normal.  Musculoskeletal:  Left bka  Neurological: He is alert and oriented  to person, place, and time.  Skin: Skin is warm. Capillary refill takes less than 2 seconds.  Psychiatric: He has a normal mood and affect. His behavior is normal. Judgment and thought content normal.  Nursing note and vitals reviewed.    ED Treatments / Results  Labs (all labs ordered are listed, but only abnormal results are displayed) Labs Reviewed  COMPREHENSIVE METABOLIC PANEL - Abnormal; Notable for the following components:      Result Value   Chloride 96 (*)    Glucose, Bld 143 (*)    BUN 22 (*)    Creatinine, Ser 5.40 (*)    Calcium 8.4 (*)    Albumin 3.1 (*)    AST 177 (*)    ALT 116 (*)    Alkaline Phosphatase 142 (*)    Total Bilirubin 2.0 (*)    GFR calc non Af Amer 11 (*)    GFR calc Af Amer 13 (*)    Anion gap 16 (*)    All other  components within normal limits  CBC WITH DIFFERENTIAL/PLATELET - Abnormal; Notable for the following components:   WBC 15.7 (*)    RBC 3.03 (*)    Hemoglobin 10.3 (*)    HCT 33.2 (*)    MCV 109.6 (*)    Neutro Abs 14.3 (*)    All other components within normal limits  CULTURE, BLOOD (ROUTINE X 2)  CULTURE, BLOOD (ROUTINE X 2)  TROPONIN I  INFLUENZA PANEL BY PCR (TYPE A & B)  I-STAT CG4 LACTIC ACID, ED  I-STAT CG4 LACTIC ACID, ED    EKG  EKG Interpretation  Date/Time:  Wednesday July 12 2017 11:39:19 EDT Ventricular Rate:  96 PR Interval:    QRS Duration: 87 QT Interval:  343 QTC Calculation: 434 R Axis:   67 Text Interpretation:  Sinus rhythm Low voltage, precordial leads Confirmed by Isla Pence [phone]) on 07/12/2017 11:58:48 AM Also confirmed by Isla Pence [phone]), editor Lynder Parents [phone])  on 07/12/2017 1:28:49 PM       Radiology Dg Chest 2 View  Result Date: 07/12/2017 CLINICAL DATA:  Dialysis patient.  Lethargy and fever. EXAM: CHEST - 2 VIEW COMPARISON:  10/14/2016 FINDINGS: Mild cardiac enlargement which is chronic. Pulmonary venous hypertension without edema. No infiltrate, collapse or effusion. No significant bone finding. IMPRESSION: Cardiomegaly and pulmonary venous hypertension.  Otherwise negative. Electronically Signed   By: Nelson Chimes M.D.   On: 07/12/2017 12:31    Procedures Procedures (including critical care time)  Medications Ordered in ED Medications  aztreonam (AZACTAM) 0.5 g in dextrose 5 % 50 mL IVPB (not administered)  vancomycin (VANCOCIN) 2,500 mg in sodium chloride 0.9 % 500 mL IVPB (2,500 mg Intravenous New Bag/Given 07/12/17 1300)  vancomycin (VANCOCIN) IVPB 1000 mg/200 mL premix (not administered)  sodium chloride 0.9 % bolus 500 mL (0 mLs Intravenous Stopped 07/12/17 1249)  aztreonam (AZACTAM) 1 g in sodium chloride 0.9 % 100 mL IVPB (0 g Intravenous Stopped 07/12/17 1401)  sodium chloride 0.9 % bolus 500 mL (500 mLs  Intravenous New Bag/Given 07/12/17 1318)     Initial Impression / Assessment and Plan / ED Course  I have reviewed the triage vital signs and the nursing notes.  Pertinent labs & imaging results that were available during my care of the patient were reviewed by me and considered in my medical decision making (see chart for details).    This patient meets SIRS Criteria and may be septic. SIRS = Systemic Inflammatory  Response Syndrome  Best Practice Recommends:   Notify the nurse immediately to increase monitoring of patient.   The recent clinical data is shown below. Vitals:   07/12/17 1330 07/12/17 1345 07/12/17 1400 07/12/17 1415  BP: (!) 96/37 (!) 99/40 (!) 93/34 (!) 90/37  Pulse: 95 92 92 90  Resp: _0 (!) 21  Temp:      TempSrc:      SpO2: 95% 97% 98% 96%  Weight:      Height:       CRITICAL CARE Performed by: Isla Pence   Total critical care time:  30 minutes  Critical care time was exclusive of separately billable procedures and treating other patients.  Critical care was necessary to treat or prevent imminent or life-threatening deterioration.  Critical care was time spent personally by me on the following activities: development of treatment plan with patient and/or surrogate as well as nursing, discussions with consultants, evaluation of patient's response to treatment, examination of patient, obtaining history from patient or surrogate, ordering and performing treatments and interventions, ordering and review of laboratory studies, ordering and review of radiographic studies, pulse oximetry and re-evaluation of patient's condition.  Pt given less than 30 cc/kg sepsis fluids because of ESRD and not finishing dialysis today.  Pt given IV azotreonam and vancomycin with pharmacy consult due to ESRD. The pt's sepsis possibly from fistula, but no definite source.  Influenza pending. The pt d/w FP resident for admission.  Final Clinical Impressions(s) / ED  Diagnoses   Final diagnoses:  Sepsis, due to unspecified organism [facility])  ESRD on hemodialysis [facility])    ED Discharge Orders    None       Isla Pence, MD 07/12/17 1419

## 2017-07-12 NOTE — ED Notes (Signed)
Admitting at the bedside.  

## 2017-07-12 NOTE — ED Notes (Signed)
Pt reports nausea. Admitting paged

## 2017-07-12 NOTE — ED Notes (Addendum)
O2 88-90% on RA. Pt placed on 2L [place]. O2 increased to 95%

## 2017-07-12 NOTE — ED Notes (Signed)
Patient transported to X-ray 

## 2017-07-12 NOTE — Progress Notes (Signed)
Inpatient Diabetes Program Recommendations  AACE/ADA: New Consensus Statement on Inpatient Glycemic Control (2015)  Target Ranges:  Prepandial:   less than 140 mg/dL      Peak postprandial:   less than 180 mg/dL (1-2 hours)      Critically ill patients:  140 - 180 mg/dL   Lab Results  Component Value Date   GLUCAP 133 (H) 07/12/2017   HGBA1C 5.7 (H) 02/25/2016    Review of Glycemic Control  Diabetes history: DM1 Outpatient Diabetes medications: insulin pump - Omnipod Basal 1.35 units/hour - total 32 units, CHO ratio 1:10 Current orders for Inpatient glycemic control: Novolog 0-15 units Q4H  Has Libre continuous glucose system. HgbA1C - 7.5% on 03/02/2017 at Endo Needs basal insulin since Type 1 DM, even if NPO.  Inpatient Diabetes Program Recommendations:     Add Lantus 20 units Q24H Decrease Novolog to 0-9 units Q4H (Type 1 DM, sensitive) Add Novolog 4 units tidwc for meal coverage insulin if pt eats > 50% meal  Will follow-up in am.  Thank you. Lorenda Peck, RD, LDN, CDE Inpatient Diabetes Coordinator [phone]

## 2017-07-12 NOTE — ED Notes (Signed)
Admitting aware pt is still complaining of nausea.

## 2017-07-12 NOTE — ED Notes (Signed)
Dinner tray ordered.

## 2017-07-12 NOTE — ED Notes (Addendum)
Main pharmacy messaged to verify ALL meds a second time

## 2017-07-12 NOTE — H&P (Addendum)
[facility] Admission History and Physical Service Pager: [phone]  Patient name: Dale [name] Medical record number: [id number] Date of birth: 04/15/1966 Age: 52 y.o. Gender: male  Primary Care Provider: Mercy Riding, MD Consultants: Nephrology Code Status: Full  Chief Complaint: Febrile and Hypotensive  Assessment and Plan: Dale [name] is a 52 y.o. male presenting with fever and hypotension . PMH is significant for ESRD on HD, Chronic diastolic CHF, CAD, Insulin-dependent T1DM, s/p L BKA, Hypothyroidism, Chronic pain, Gout, GERD, and HLD.  SIRS with unknown source Patient febrile with hypotension upon admission. Hypotension improved after receiving IVF. He was febrile to 103 and was given tylenol en route to ED and was given 250 cc bolus. Patient had a fistulagram done yesterday by Dr. Donzetta Matters. He is allergic to IV dye, but was pretreated with solumedrol 125 mg, benadryl 50 mg, and pepcid 20 mg. Patient felt bad prior to fistulagram Patient does take midodrine 10 mg TID for hypotension. May be insetting of previous illness and fistulagram exacerbated illness. CXR with no signs of active disease. WBC elevated to 15.7.Blood cultures pending. Patient anuric. Patient denies any URI symptoms or sick contacts. Influenza pending. LA 1.87.  - Admit to telemetry, attending Dr. Ardelia Mems - Vitals per unit - Vanc and aztreonam per pharmacy  - s/p 1222m NS, will not continue given ESRD, and will be getting fluids with abx tx - Monitor I's/O's - Monitor for fever - Influenza pending  AM CBC, CMP, TSH  ESRD on HD, MWF: s/p kidney transplant in 1995.  - Pt completed almost full HD session today - Nephrology consulted and is following, will keep HD schedule - Pt is anuric and s/p fluid boluses - Follow daily wts and I's/O's  Chronic HFpEF: ECHO (11/01/16) with EF 60-65%, moderate LVH, grade1DD, elevated LV filling pressure, trivial MR, mild TR, RVSP 39 mmHg, normal  IVC. On metoprolol 244mBID  - Follow daily wts and I/O  - Holding beta-blocker d/t hypotension    CAD: hx of stents, No anginal complaints  - EKG without acute changes  - Troponin undetectable - Continue Lipitor, Effient, and ASA 81  Insulin-dependent T1DM: Insulin pump- Omnipod- changes every 3 days - A1c 5.7% 02/25/2016 - Managed at home with insulin pump  - Check CBG's every 4 hours  - mod SSI until pump available, as patient ran out today prior to admission  Anemia: Hgb on admission 10.3, baseline appears to be 11.5-12.5 - Monitor on daily CBC  - Folate, Vit B12 pending  S/p L BKA: Chronic, stable. - Up with assistance  Hypothyroidism: On synthroid [id number] home - TSH pending - Continue Synthroid    Chronic pain: Followed by rheumatology per patient. Attributed to low back degenerative disease. Continue home-regimen as tolerated. - Dilaudid 2mg39mD prn - gabapentin 300mg47m (pt reports taking it BID)- given BID here- not renally dosed currently, will re-evaluate - Bowel regimen to prevent constipation  Gout: On allopurinol 200mg 107my  - Continue home dose of Allopurinol, which may need re-evaluation for renal adjustment  GERD: on Prilosec 40mg B33mer patient as well as Reglan starting 3 weeks ago - Protonix 40mg da45m HLD: - fenofibrate and atorvastatin  Mild Transaminitis: - Repeat CMP tomorrow  FEN/GI: Renal/carb modified diet, PPI Prophylaxis: Heparin per pharmacy  Disposition: admit to telemetry  History of Present Illness:  Dale [name] y.o. 31le presenting with fever and low bloos pressure during  dialysis. Yesterday woke up with low grade fever. Then came for fistulagram. Later that night after fistulagram had temperature to 99. He was restless all night but did not wake up with temperature. Patient then went to HD today. At HD patient felt hot and didn't feel good at all. Patient reported chills and got his blanket out for the first time.  They kept checking his temperature and it was elevated. They checked his BP but it was low and because of this, they turned off his HD. Believes he almost finished his whole 4 hours. Wife arrived at 10am and he was already off, maybe 9:45am. Patient got put in ambulance and they gave fluids and checked BP. He also got liquid tylenol.   Patient was given solumedrol, benadryl and Pepcid for contrast dye allergy prior to fistulagram. Patient lives with wife and dog and no one has been sick. Denies confusion, but patient has been forgetting more words.   Review Of Systems: Per HPI with the following additions:   Review of Systems  Constitutional: Positive for chills and fever.  HENT: Negative for congestion.   Respiratory: Negative for cough and shortness of breath.   Cardiovascular: Negative for chest pain.  Gastrointestinal: Positive for heartburn. Negative for abdominal pain, constipation, diarrhea, nausea and vomiting.    Patient Active Problem List   Diagnosis Date Noted  . Sepsis ([place]) 07/12/2017  . Hypotension (arterial) 10/15/2016  . Heme positive stool   . IDDM with complications 22/05/5425  . Oxygen dependent 03/24/2016  . Visit for preventive health examination 03/24/2016  . Chronic diastolic heart failure ([place]) 02/25/2016  . Tracheobronchomalacia 02/04/2016  . Anemia of chronic disease   . Iron deficiency anemia   . Gastrointestinal hemorrhage with melena 12/21/2015  . Pulmonary edema 03/09/2015  . Chronic pain 03/09/2015  . HLD (hyperlipidemia) 11/27/2014  . ESRD on dialysis ([place]) 09/30/2014  . Coronary artery disease involving native coronary artery of native heart with unstable angina pectoris ([place]) 03/20/2014  . Allergic reaction to dye 03/20/2014  . NSTEMI (non-ST elevated myocardial infarction) ([place]) 03/19/2014  . HTN (hypertension) 02/18/2014  . Pericardial effusion 08/27/2011  . History of renal transplant 07/22/2011  . S/P BKA (below knee amputation) ([place])  07/22/2011  . OSA (obstructive sleep apnea) 07/22/2011    Past Medical History: Past Medical History:  Diagnosis Date  . Anal fissure   . Anemia of chronic disease   . Arthritis    "hands, right knee" (03/19/2014) no cartlidge in right knee  . CAD (coronary artery disease)    a. Abnl nuc ->LHC (11/15):  mid to dist Dx 80%, mid RCA 99% (functional CTO), dist RCA 80% >> PCI: balloon angioplasty to mid RCA (could not deliver stent) - staged PCI Rotoblator atherectomy/DES to [facility]. b. NSTEMI 12/2015 s/p DES to diagonal.  . Cholecystitis    a. 08/27/2011  . Chronic diastolic CHF (congestive heart failure) ([place])    a. Echo 3/13:  EF 55-60%;  b. Echo (11/15):  Mild LVH, EF 60-65%, no RWMA, Gr 1 DD, MAC, mild LAE, normal RVF, mild RAE;  c.  Echo 5/16:  severe LVH, EF 55-60%, no RWMA, Gr 1 DD, MAC, mild LAE  . Claustrophobia    when things get around his face.   . Complication of anesthesia   . Difficult intubation    only once in 2015 at [facility] haven't had a problem since then  . Diverticulosis   . ESRD (end stage renal disease) ([place])  a. 1995 s/p cadaveric transplant w/ susbequent failure after 18 yrs;  b.Dialysis initiated 07/2011 [facility] M-W-F  . Fibromyalgia   . Gastroparesis   . GERD (gastroesophageal reflux disease)   . Gout    PMH  . History of blood transfusion   . HLD (hyperlipidemia)   . Hypertension   . Hypothyroidism   . Inguinal lymphadenopathy    a. bilateral - s/p biopsy 07/2011  . Insulin dependent diabetes mellitus (HCC)    Type I  . Monilial esophagitis ([place])   . Morbid obesity ([place])   . OSA (obstructive sleep apnea)    adjustable bed  . PAD (peripheral artery disease) (HCC)    a. s/p L BKA.  [name] Kitchen Pericardial effusion    a.  Small by CT 08/22/11;  b.  Large by CT 08/27/11. c. Not seen on 11/2015 echo.  . Pneumonia ~ 2007  . PONV (postoperative nausea and vomiting)    vomited once after a procedure.  [name] Kitchen Tracheobronchomalacia     Past Surgical History: Past  Surgical History:  Procedure Laterality Date  . A/V FISTULAGRAM Left 07/26/2016   Procedure: A/V Fistulagram;  Surgeon: Waynetta Sandy, MD;  Location: [place] CV LAB;  Service: Cardiovascular;  Laterality: Left;  . A/V FISTULAGRAM N/A 07/11/2017   Procedure: A/V FISTULAGRAM - Left AV;  Surgeon: Waynetta Sandy, MD;  Location: [place] CV LAB;  Service: Cardiovascular;  Laterality: N/A;  . AMPUTATION OF REPLICATED TOES Right   . ANGIOPLASTY  04/09/2014   RCA   . APPENDECTOMY  ~ 2006  . ARTERIOVENOUS GRAFT PLACEMENT Left 1993?   forearm  . ARTERIOVENOUS GRAFT PLACEMENT Right 1993?   leg  . ARTERIOVENOUS GRAFT PLACEMENT Left    Thigh  . Arteriovenous Graft Removed Left    Thigh  . AV FISTULA PLACEMENT  07/29/2011   Procedure: ARTERIOVENOUS (AV) FISTULA CREATION;  Surgeon: Mal Misty, MD;  Location: [place];  Service: Vascular;  Laterality: Right;  Brachial cephalic  . AV FISTULA PLACEMENT Left 01/20/2015   Procedure: LIGATION OF RIGHT ARTERIOVENOUS FISTULA WITH EXCISION OF ANEURYSM;  Surgeon: Angelia Mould, MD;  Location: [place];  Service: Vascular;  Laterality: Left;  . AV FISTULA PLACEMENT Left 04/30/2015   Procedure: CREATION OF LEFT ARM BRACHIO-CEPHALIC ARTERIOVENOUS (AV) FISTULA ;  Surgeon: Angelia Mould, MD;  Location: [place];  Service: Vascular;  Laterality: Left;  . AV Graft PLACEMENT Left 1993?   "attempted one in my wrist; didn't take"  . BELOW KNEE LEG AMPUTATION Left 2010  . CARDIAC CATHETERIZATION  03/19/2014  . CARDIAC CATHETERIZATION  03/19/2014   Procedure: CORONARY BALLOON ANGIOPLASTY;  Surgeon: Burnell Blanks, MD;  Location: [facility] CATH LAB;  Service: Cardiovascular;;  . CARDIAC CATHETERIZATION N/A 01/01/2016   Procedure: Left Heart Cath and Coronary Angiography;  Surgeon: Troy Sine, MD;  Location: [place] CV LAB;  Service: Cardiovascular;  Laterality: N/A;  . CARDIAC CATHETERIZATION N/A 01/01/2016   Procedure: Coronary Stent  Intervention;  Surgeon: Troy Sine, MD;  Location: [place] CV LAB;  Service: Cardiovascular;  Laterality: N/A;  . CARPAL TUNNEL RELEASE Bilateral after 2005  . CATARACT EXTRACTION W/ INTRAOCULAR LENS  IMPLANT, BILATERAL Bilateral 1990's  . COLONOSCOPY  08/29/2011   Procedure: COLONOSCOPY;  Surgeon: Jerene Bears, MD;  Location: [place];  Service: Gastroenterology;  Laterality: N/A;  . COLONOSCOPY WITH PROPOFOL N/A 04/26/2016   Procedure: COLONOSCOPY WITH PROPOFOL;  Surgeon: Jerene Bears, MD;  Location: [facility];  Service: Gastroenterology;  Laterality: N/A;  . CORONARY ANGIOPLASTY WITH STENT PLACEMENT  04/09/2014       ptca/des mid lad   . ESOPHAGOGASTRODUODENOSCOPY N/A 12/25/2015   Procedure: ESOPHAGOGASTRODUODENOSCOPY (EGD);  Surgeon: Mauri Pole, MD;  Location: [facility] ENDOSCOPY;  Service: Endoscopy;  Laterality: N/A;  bedside  . ESOPHAGOGASTRODUODENOSCOPY (EGD) WITH PROPOFOL N/A 04/26/2016   Procedure: ESOPHAGOGASTRODUODENOSCOPY (EGD) WITH PROPOFOL;  Surgeon: Jerene Bears, MD;  Location: [facility] ENDOSCOPY;  Service: Gastroenterology;  Laterality: N/A;  . EYE SURGERY Bilateral    cataract  . FEMORAL ARTERY REPAIR  04/09/2014   ANGIOSEAL  . INSERTION OF DIALYSIS CATHETER  08/01/2011   Procedure: INSERTION OF DIALYSIS CATHETER;  Surgeon: Rosetta Posner, MD;  Location: [place];  Service: Vascular;  Laterality: Right;  insertion of dialysis catheter on right internal jugular vein  . INSERTION OF DIALYSIS CATHETER Right 01/20/2015   Procedure: INSERTION OF DIALYSIS CATHETER - RIGHT INTERNAL JUGULAR ;  Surgeon: Angelia Mould, MD;  Location: [place];  Service: Vascular;  Laterality: Right;  . KIDNEY TRANSPLANT  04/25/1993  . LEFT HEART CATHETERIZATION WITH CORONARY ANGIOGRAM N/A 03/19/2014   Procedure: LEFT HEART CATHETERIZATION WITH CORONARY ANGIOGRAM;  Surgeon: Burnell Blanks, MD;  Location: [facility] CATH LAB;  Service: Cardiovascular;  Laterality: N/A;  . LYMPH NODE BIOPSY  08/10/2011    Procedure: LYMPH NODE BIOPSY;  Surgeon: Merrie Roof, MD;  Location: [place];  Service: General;  Laterality: Left;  left groin lymph biopsy  . NEUROPLASTY / TRANSPOSITION ULNAR NERVE AT ELBOW Left after 2005  . PATCH ANGIOPLASTY Left 08/09/2016   Procedure: LEFT  ARTERIOVENOUS FISTULA PATCH ANGIOPLASTY;  Surgeon: Waynetta Sandy, MD;  Location: [place];  Service: Vascular;  Laterality: Left;  . PERCUTANEOUS CORONARY ROTOBLATOR INTERVENTION (PCI-R) N/A 04/09/2014   Procedure: PERCUTANEOUS CORONARY ROTOBLATOR INTERVENTION (PCI-R);  Surgeon: Burnell Blanks, MD;  Location: [facility] CATH LAB;  Service: Cardiovascular;  Laterality: N/A;  . PERIPHERAL VASCULAR BALLOON ANGIOPLASTY  07/11/2017   Procedure: PERIPHERAL VASCULAR BALLOON ANGIOPLASTY;  Surgeon: Waynetta Sandy, MD;  Location: [place] CV LAB;  Service: Cardiovascular;;  left arm AV fistula  . PERIPHERAL VASCULAR CATHETERIZATION N/A 12/25/2014   Procedure: Upper Extremity Venography;  Surgeon: Conrad Lopeno, MD;  Location: [place] CV LAB;  Service: Cardiovascular;  Laterality: N/A;  . PERIPHERAL VASCULAR CATHETERIZATION Left 02/24/2016   Procedure: Fistulagram;  Surgeon: Waynetta Sandy, MD;  Location: [place] CV LAB;  Service: Cardiovascular;  Laterality: Left;  . PERIPHERAL VASCULAR CATHETERIZATION Left 02/24/2016   Procedure: Peripheral Vascular Intervention;  Surgeon: Waynetta Sandy, MD;  Location: [place] CV LAB;  Service: Cardiovascular;  Laterality: Left;  AV FISTULA  . PERITONEAL CATHETER INSERTION    . PERITONEAL CATHETER REMOVAL    . REVISON OF ARTERIOVENOUS FISTULA Right 4/46/2863   Procedure: PLICATION / REVISION OF ARTERIOVENOUS FISTULA;  Surgeon: Angelia Mould, MD;  Location: [place];  Service: Vascular;  Laterality: Right;  . REVISON OF ARTERIOVENOUS FISTULA Left 09/01/2015   Procedure: SUPERFICIALIZATION OF LEFT ARM BRACHIOCEPHALIC ARTERIOVENOUS FISTULA;  Surgeon: Angelia Mould, MD;  Location: [place];  Service: Vascular;  Laterality: Left;  . REVISON OF ARTERIOVENOUS FISTULA Left 08/09/2016   Procedure: REVISION OF ARTERIAL ANASTOMOSIS OF ARTERIOVENOUS FISTULA;  Surgeon: Waynetta Sandy, MD;  Location: [place];  Service: Vascular;  Laterality: Left;  . SHOULDER ARTHROSCOPY Left ~ 2006   frozen  . TOE AMPUTATION Bilateral after 2005  .  UMBILICAL HERNIA REPAIR  1990's  . VENOGRAM Right 07/28/2011   Procedure: VENOGRAM;  Surgeon: Conrad Frannie, MD;  Location: [facility] CATH LAB;  Service: Cardiovascular;  Laterality: Right;  . VITRECTOMY Bilateral     Social History: Social History   Tobacco Use  . Smoking status: Never Smoker  . Smokeless tobacco: Never Used  Substance Use Topics  . Alcohol use: Yes    Comment: occasional  . Drug use: No   Additional social history: lives with wife and dog.   Please also refer to relevant sections of EMR.  Family History: Family History  Problem Relation Age of Onset  . Colon polyps Father   . Diabetes Father   . Hypertension Father   . Hypertension Mother   . Diabetes Mother   . Hyperlipidemia Mother   . Breast cancer Maternal Aunt   . Bone cancer Maternal Aunt   . Liver cancer Maternal Aunt   . Malignant hyperthermia Neg Hx   . Heart attack Neg Hx   . Stroke Neg Hx   . Colon cancer Neg Hx   . Stomach cancer Neg Hx   . Pancreatic cancer Neg Hx   . Esophageal cancer Neg Hx     Allergies and Medications: Allergies  Allergen Reactions  . Iodinated Diagnostic Agents Itching, Rash and Other (See Comments)    Systemic itching and transient rash appearance within hours of receiving contrast    . Prednisone Other (See Comments)    Increases sugar levels  . Adhesive [Tape] Itching and Other (See Comments)    Blisters on arm from adhesive tape at dialysis   . Amoxicillin Itching, Rash and Other (See Comments)    Has patient had a PCN reaction causing immediate rash, facial/tongue/throat swelling, SOB or  lightheadedness with hypotension: Yes Has patient had a PCN reaction causing severe rash involving mucus membranes or skin necrosis: No Has patient had a PCN reaction that required hospitalization No Has patient had a PCN reaction occurring within the last 10 years: No If all of the above answers are "NO", then may proceed with Cephalosporin use.  . Clindamycin/Lincomycin Hives and Itching  . Enalapril Rash and Cough  . Mobic [Meloxicam] Hives and Rash  . Morphine Hives, Nausea Only and Rash  . Brilinta [Ticagrelor] Nausea And Vomiting  . Ciprofloxacin Itching and Rash  . Clopidogrel Rash  . Codeine Hives, Itching and Rash  . Doxycycline Rash  . Hydrocodone Rash  . Levofloxacin Hives and Itching  . Oxycodone Rash  . Rocephin [Ceftriaxone] Itching   No current facility-administered medications on file prior to encounter.    Current Outpatient Medications on File Prior to Encounter  Medication Sig Dispense Refill  . acetaminophen (TYLENOL) 500 MG tablet Take 1,000 mg by mouth every 4 (four) hours as needed for moderate pain or headache.     . allopurinol (ZYLOPRIM) 100 MG tablet TAKE 2 TABLETS BY MOUTH EVERY DAY (Patient taking differently: TAKE 200 MG BY MOUTH EVERY DAY) 180 tablet 3  . aspirin EC 81 MG tablet Take 81 mg by mouth at bedtime.    [name] Kitchen atorvastatin (LIPITOR) 40 MG tablet TAKE 1 TABLET(40 MG) BY MOUTH DAILY 90 tablet 3  . calcium acetate (PHOSLO) 667 MG capsule Take 667-1,334 mg by mouth See admin instructions. Take 1360ms with meals and 6676m with snacks    . cetirizine (ZYRTEC) 10 MG tablet Take 10 mg by mouth daily.    . [name] Kitchenocusate sodium (COLACE) 100 MG capsule Take 200  mg by mouth 2 (two) times daily.     . fenofibrate (TRICOR) 48 MG tablet Take 48 mg by mouth daily.     . fluticasone (FLONASE) 50 MCG/ACT nasal spray Place 2 sprays into both nostrils daily as needed for allergies.     [name] Kitchen FOSRENOL 1000 MG PACK Take 1,000 mg by mouth 3 (three) times daily.   6  .  gabapentin (NEURONTIN) 300 MG capsule Take 300 mg by mouth 3 (three) times daily.    [name] Kitchen GLUCAGON EMERGENCY 1 MG injection Inject 1 mg into the muscle daily as needed (low blood sugar).     [name] Kitchen HYDROmorphone (DILAUDID) 2 MG tablet Take 2 mg by mouth 2 (two) times daily.     . hydrOXYzine (ATARAX/VISTARIL) 10 MG tablet Take 10 mg by mouth every 8 (eight) hours as needed for anxiety or vomiting.     . insulin lispro (HUMALOG) 100 UNIT/ML injection Inject 1-20 Units into the skin 3 (three) times daily. Blood glucose - 120, 1 unit for every 20 points over 120 ,  PT RECEIVES FROM INSULIN PUMP    . levothyroxine (SYNTHROID, LEVOTHROID) 112 MCG tablet Take 112 mcg by mouth every evening. 2 hours after dinner    . metoprolol tartrate (LOPRESSOR) 25 MG tablet Take 1 tablet (25 mg total) by mouth 2 (two) times daily. 180 tablet 3  . midodrine (PROAMATINE) 10 MG tablet Take 1 tablet (10 mg total) by mouth 3 (three) times daily. Takes 1 tab in mornings of dialysis and takes 2 tabs 30 mins before starting dialysis (m,w,f)    . multivitamin (RENA-VIT) TABS tablet Take 1 tablet by mouth daily.    . mupirocin ointment (BACTROBAN) 2 % Place 1 application into the nose 2 (two) times daily. 15 g 0  . nitroGLYCERIN (NITROSTAT) 0.4 MG SL tablet Place 1 tablet (0.4 mg total) under the tongue every 5 (five) minutes as needed for chest pain. 25 tablet 4  . Nutritional Supplements (FEEDING SUPPLEMENT, NEPRO CARB STEADY,) LIQD Take 237 mLs by mouth daily. 30 Can 0  . omeprazole (PRILOSEC OTC) 20 MG tablet Take 20 mg by mouth daily.    . OXYGEN Inhale 3 L into the lungs continuous.     Vladimir Faster Glycol-Propyl Glycol (SYSTANE) 0.4-0.3 % SOLN Place 1 drop into both eyes as needed (dry eyes).    . polyethylene glycol (MIRALAX / GLYCOLAX) packet Take 17 g by mouth daily. (Patient taking differently: Take 17 g by mouth daily as needed for mild constipation. ) 14 each 0  . prasugrel (EFFIENT) 10 MG TABS tablet TAKE ONE TABLET BY  MOUTH EVERY DAY (Patient taking differently: TAKE 10 MG BY MOUTH EVERY DAY) 90 tablet 3  . promethazine (PHENERGAN) 25 MG tablet Take 1 tablet (25 mg total) by mouth every 6 (six) hours as needed for nausea or vomiting. For nausea 30 tablet 0  . rOPINIRole (REQUIP) 2 MG tablet TAKE 1 TABLET(2 MG) BY MOUTH EVERY NIGHT 90 tablet 0  . testosterone cypionate (DEPOTESTOSTERONE CYPIONATE) 200 MG/ML injection INJECT 200 MG INTO THE MUSCLE EVERY 14 DAYS  1  . VELPHORO 500 MG chewable tablet   10  . Wound Cleansers (WOUND WASH) LIQD Apply 1 application topically daily as needed (wound care).      Objective: BP (!) 94/34   Pulse 88   Temp (S) (!) 100.7 F (38.2 C) (Rectal)   Resp (!) 21   Ht _0  (1.727 m)   Wt 283 lb (128.4  kg)   SpO2 98%   BMI 43.03 kg/m  Exam: General: NAD, pleasant, obese male laying in bed Eyes: PERRL, EOMI, no conjunctival pallor or injection ENTM: Moist mucous membranes, no pharyngeal erythema or exudate Neck: Supple, no JVD Cardiovascular: RRR, no m/r/g, no LE edema Respiratory: CTA BL, normal work of breathing Gastrointestinal: soft in upper quadrants, hard in lower quadrants, nontender, nondistended, normoactive BS MSK: moves 4 extremities equally, L BKA with stocking on R LE with 2 areas bandaged Derm: no rashes appreciated Neuro: CN II-XII grossly intact Psych: AOx3, appropriate affect  Labs and Imaging: CBC BMET  Recent Labs  Lab 07/12/17 1149  WBC 15.7*  HGB 10.3*  HCT 33.2*  PLT 243   Recent Labs  Lab 07/12/17 1149  NA 136  K 4.1  CL 96*  CO2 24  BUN 22*  CREATININE 5.40*  GLUCOSE 143*  CALCIUM 8.4*     LA 1.87 Influenza pending  Dg Chest 2 View  Result Date: 07/12/2017 CLINICAL DATA:  Dialysis patient.  Lethargy and fever. EXAM: CHEST - 2 VIEW COMPARISON:  10/14/2016 FINDINGS: Mild cardiac enlargement which is chronic. Pulmonary venous hypertension without edema. No infiltrate, collapse or effusion. No significant bone finding.  IMPRESSION: Cardiomegaly and pulmonary venous hypertension.  Otherwise negative. Electronically Signed   By: Nelson Chimes M.D.   On: 07/12/2017 12:31    Shirley, [place], DO 07/12/2017, 2:50 PM PGY-1, [place] Intern pager: [phone], text pages welcome

## 2017-07-12 NOTE — ED Notes (Signed)
EDP notified of BP 

## 2017-07-12 NOTE — ED Notes (Signed)
EDP notified of temp

## 2017-07-12 NOTE — Progress Notes (Signed)
Consult received.  52 year old WM with ESRD on MWF HD in [place] had fistulagram with PTA yesterday.  Was afebrile pre HD without complaints except wanting his EDW raised.  Patient spiked a temp at the end to 100.6 He completed his full HD with a net UF of 3 L and post wt 129.8 (new EDW 129 up from 128.5) and pt wanted to come to the hospital. [facility] were drawn, but no antibiotics were give due to going to the hospital.  . CXR showed CM, pul venous HTN.  He is influenza negative. WBC 15.7 Chemistries as expected post HD EXCEPT AST is 177 and ALT 116 T Bili 2.0 alb 3.1. We will see in the morning for full consult  Amalia Hailey, PA-C [facility]

## 2017-07-13 ENCOUNTER — Telehealth: Payer: Self-pay | Admitting: Vascular Surgery

## 2017-07-13 ENCOUNTER — Inpatient Hospital Stay (HOSPITAL_COMMUNITY): Payer: Medicare Other

## 2017-07-13 LAB — COMPREHENSIVE METABOLIC PANEL
ALT: 102 U/L — ABNORMAL HIGH (ref 17–63)
AST: 115 U/L — ABNORMAL HIGH (ref 15–41)
Albumin: 2.9 g/dL — ABNORMAL LOW (ref 3.5–5.0)
Alkaline Phosphatase: 167 U/L — ABNORMAL HIGH (ref 38–126)
Anion gap: 15 (ref 5–15)
BUN: 34 mg/dL — ABNORMAL HIGH (ref 6–20)
CO2: 25 mmol/L (ref 22–32)
Calcium: 8.2 mg/dL — ABNORMAL LOW (ref 8.9–10.3)
Chloride: 93 mmol/L — ABNORMAL LOW (ref 101–111)
Creatinine, Ser: 7.86 mg/dL — ABNORMAL HIGH (ref 0.61–1.24)
GFR calc Af Amer: 8 mL/min — ABNORMAL LOW (ref >60)
GFR calc non Af Amer: 7 mL/min — ABNORMAL LOW (ref >60)
Glucose, Bld: 262 mg/dL — ABNORMAL HIGH (ref 65–99)
Potassium: 4.5 mmol/L (ref 3.5–5.1)
Sodium: 133 mmol/L — ABNORMAL LOW (ref 135–145)
Total Bilirubin: 3.5 mg/dL — ABNORMAL HIGH (ref 0.3–1.2)
Total Protein: 6.7 g/dL (ref 6.5–8.1)

## 2017-07-13 LAB — CBC
HCT: 31.8 % — ABNORMAL LOW (ref 39.0–52.0)
Hemoglobin: 9.6 g/dL — ABNORMAL LOW (ref 13.0–17.0)
MCH: 33.9 pg (ref 26.0–34.0)
MCHC: 30.2 g/dL (ref 30.0–36.0)
MCV: 112.4 fL — ABNORMAL HIGH (ref 78.0–100.0)
Platelets: 188 10*3/uL (ref 150–400)
RBC: 2.83 MIL/uL — ABNORMAL LOW (ref 4.22–5.81)
RDW: 15 % (ref 11.5–15.5)
WBC: 9.6 10*3/uL (ref 4.0–10.5)

## 2017-07-13 LAB — GLUCOSE, CAPILLARY
Glucose-Capillary: 112 mg/dL — ABNORMAL HIGH (ref 65–99)
Glucose-Capillary: 162 mg/dL — ABNORMAL HIGH (ref 65–99)
Glucose-Capillary: 182 mg/dL — ABNORMAL HIGH (ref 65–99)
Glucose-Capillary: 196 mg/dL — ABNORMAL HIGH (ref 65–99)
Glucose-Capillary: 241 mg/dL — ABNORMAL HIGH (ref 65–99)
Glucose-Capillary: 242 mg/dL — ABNORMAL HIGH (ref 65–99)
Glucose-Capillary: 251 mg/dL — ABNORMAL HIGH (ref 65–99)

## 2017-07-13 LAB — MRSA PCR SCREENING: MRSA by PCR: POSITIVE — AB

## 2017-07-13 LAB — TSH: TSH: 1.517 u[IU]/mL (ref 0.350–4.500)

## 2017-07-13 LAB — VITAMIN B12: Vitamin B-12: 430 pg/mL (ref 180–914)

## 2017-07-13 MED ORDER — PROMETHAZINE HCL 25 MG/ML IJ SOLN
12.5000 mg | Freq: Once | INTRAMUSCULAR | Status: AC | PRN
  Administered 2017-07-13: 12.5 mg via INTRAVENOUS
  Filled 2017-07-13: qty 1

## 2017-07-13 MED ORDER — RENA-VITE PO TABS
1.0000 | ORAL_TABLET | Freq: Every day | ORAL | Status: DC
  Administered 2017-07-13 – 2017-07-17 (×5): 1 via ORAL
  Filled 2017-07-13 (×5): qty 1

## 2017-07-13 MED ORDER — MIDODRINE HCL 5 MG PO TABS
10.0000 mg | ORAL_TABLET | ORAL | Status: DC
  Administered 2017-07-14 – 2017-07-17 (×2): 10 mg via ORAL
  Filled 2017-07-13: qty 2

## 2017-07-13 MED ORDER — MIDODRINE HCL 5 MG PO TABS
10.0000 mg | ORAL_TABLET | Freq: Two times a day (BID) | ORAL | Status: DC | PRN
Start: 2017-07-13 — End: 2017-07-18

## 2017-07-13 MED ORDER — INSULIN PUMP
SUBCUTANEOUS | Status: DC
  Administered 2017-07-14: 18:00:00 via SUBCUTANEOUS
  Filled 2017-07-13: qty 1

## 2017-07-13 MED ORDER — PIPERACILLIN-TAZOBACTAM 3.375 G IVPB
3.3750 g | Freq: Two times a day (BID) | INTRAVENOUS | Status: DC
  Administered 2017-07-13 – 2017-07-16 (×7): 3.375 g via INTRAVENOUS
  Filled 2017-07-13 (×8): qty 50

## 2017-07-13 MED ORDER — DIPHENHYDRAMINE HCL 25 MG PO CAPS: 25.0000 mg | ORAL_CAPSULE | Freq: Four times a day (QID) | ORAL | Status: DC | PRN

## 2017-07-13 MED ORDER — PROMETHAZINE HCL 25 MG/ML IJ SOLN
12.5000 mg | Freq: Four times a day (QID) | INTRAMUSCULAR | Status: DC | PRN
  Administered 2017-07-13 – 2017-07-14 (×3): 25 mg via INTRAVENOUS
  Filled 2017-07-13 (×2): qty 1

## 2017-07-13 MED ORDER — CALCIUM ACETATE (PHOS BINDER) 667 MG PO CAPS
667.0000 mg | ORAL_CAPSULE | Freq: Three times a day (TID) | ORAL | Status: DC
  Administered 2017-07-13 – 2017-07-18 (×10): 667 mg via ORAL
  Filled 2017-07-13 (×10): qty 1

## 2017-07-13 MED ORDER — DARBEPOETIN ALFA 40 MCG/0.4ML IJ SOSY: 40.0000 ug | PREFILLED_SYRINGE | INTRAMUSCULAR | Status: DC

## 2017-07-13 MED ORDER — DOXERCALCIFEROL 4 MCG/2ML IV SOLN
3.0000 ug | INTRAVENOUS | Status: DC
  Administered 2017-07-14 – 2017-07-17 (×2): 3 ug via INTRAVENOUS
  Filled 2017-07-13 (×2): qty 2

## 2017-07-13 MED ORDER — HYDROCHLOROTHIAZIDE 12.5 MG PO CAPS: 12.5000 mg | ORAL_CAPSULE | Freq: Every day | ORAL | Status: DC

## 2017-07-13 MED ORDER — HYDROMORPHONE HCL 2 MG PO TABS
2.0000 mg | ORAL_TABLET | Freq: Three times a day (TID) | ORAL | Status: DC | PRN
  Administered 2017-07-15 – 2017-07-17 (×5): 2 mg via ORAL
  Filled 2017-07-13 (×5): qty 1

## 2017-07-13 MED ORDER — DOCUSATE SODIUM 100 MG PO CAPS: 200.0000 mg | ORAL_CAPSULE | Freq: Two times a day (BID) | ORAL | Status: DC | PRN

## 2017-07-13 NOTE — Telephone Encounter (Signed)
Left vm for pt regarding 08/25/17 appt. Mailed letter.

## 2017-07-13 NOTE — Consult Note (Addendum)
[facility] Renal Consultation Note    Indication for Consultation:  Management of ESRD/hemodialysis; anemia, hypertension/volume and secondary hyperparathyroidism PCP: Mercy Riding, MD  HPI: Dale [name] is a 52 y.o. male with ESRD (MWF HP)secondary to DM with history of initially starting on dialysis in 1991, then transplanted 1995 - 2013 then resuming HD at that time.  His PMHx is also significant for CAD, s/p stent, chronic HFpEF, left BKA, hypothyroidism, OSA.  He had a fistulagram 3/19 and intervention 3/19 and returned to dialysis as usual 3/20 with normal VS pre HD but then spiked a temp at the end to 100.6 and became lethargic.  BC were drawn but because he was to come to the hospital, we elected not to give antibiotics prior to transfer. Due to the long turn around time with outpatient [facility] it made more sense to have BC redrawn at [facility] without antibiotics already on board. The patient had had premeds 3/19 due to contrast allergy before his fistulagram [place] and felt Wed am like he had an hangover from the meds and thought he would feel better after a few hours of dialysis, but he didn't.  In spite of the premeds he still had diffuse erythematous reaction to Contrast dye but he never had any associated fever before.    CXR in the ED showed CM, pul venous hypertension. His post HD wt was 129.8 with net UF of 3 L (EDW just raised from 128.5 to - 129 per pt request).  WBC was 15.7 Chemistries were appropriative for post HD status except AST was 177 and ALT 116 T Bili was 2 alb 3.1.  He has been taking statins in addition to fenofibrate.  Today he is very nauseated and is requesting change from IV zofran to phenergan because it doesn't work.  Past Medical History:  Diagnosis Date  . Anal fissure   . Anemia of chronic disease   . Arthritis    "hands, right knee" (03/19/2014) no cartlidge in right knee  . CAD (coronary artery disease)    a. Abnl nuc ->LHC (11/15):  mid to  dist Dx 80%, mid RCA 99% (functional CTO), dist RCA 80% >> PCI: balloon angioplasty to mid RCA (could not deliver stent) - staged PCI Rotoblator atherectomy/DES to [facility]. b. NSTEMI 12/2015 s/p DES to diagonal.  . Cholecystitis    a. 08/27/2011  . Chronic diastolic CHF (congestive heart failure) ([place])    a. Echo 3/13:  EF 55-60%;  b. Echo (11/15):  Mild LVH, EF 60-65%, no RWMA, Gr 1 DD, MAC, mild LAE, normal RVF, mild RAE;  c.  Echo 5/16:  severe LVH, EF 55-60%, no RWMA, Gr 1 DD, MAC, mild LAE  . Claustrophobia    when things get around his face.   . Complication of anesthesia   . Difficult intubation    only once in 2015 at [facility] haven't had a problem since then  . Diverticulosis   . ESRD (end stage renal disease) ([place])    a. 1995 s/p cadaveric transplant w/ susbequent failure after 18 yrs;  b.Dialysis initiated 07/2011 [facility] M-W-F  . Fibromyalgia   . Gastroparesis   . GERD (gastroesophageal reflux disease)   . Gout    PMH  . History of blood transfusion   . HLD (hyperlipidemia)   . Hypertension   . Hypothyroidism   . Inguinal lymphadenopathy    a. bilateral - s/p biopsy 07/2011  . Insulin dependent diabetes mellitus ([place])  Type I  . Monilial esophagitis ([place])   . Morbid obesity ([place])   . OSA (obstructive sleep apnea)    adjustable bed  . PAD (peripheral artery disease) (H[facility])    a. s/p L BKA.  [name] Kitchen Pericardial effusion    a.  Small by CT 08/22/11;  b.  Large by CT 08/27/11. c. Not seen on 11/2015 echo.  . Pneumonia ~ 2007  . PONV (postoperative nausea and vomiting)    vomited once after a procedure.  . T1DM (type 1 diabetes mellitus) ([place])   . Tracheobronchomalacia    Past Surgical History:  Procedure Laterality Date  . A/V FISTULAGRAM Left 07/26/2016   Procedure: A/V Fistulagram;  Surgeon: Waynetta Sandy, MD;  Location: [place] CV LAB;  Service: Cardiovascular;  Laterality: Left;  . A/V FISTULAGRAM N/A 07/11/2017   Procedure: A/V FISTULAGRAM - Left AV;   Surgeon: Waynetta Sandy, MD;  Location: [place] CV LAB;  Service: Cardiovascular;  Laterality: N/A;  . AMPUTATION OF REPLICATED TOES Right   . ANGIOPLASTY  04/09/2014   RCA   . APPENDECTOMY  ~ 2006  . ARTERIOVENOUS GRAFT PLACEMENT Left 1993?   forearm  . ARTERIOVENOUS GRAFT PLACEMENT Right 1993?   leg  . ARTERIOVENOUS GRAFT PLACEMENT Left    Thigh  . Arteriovenous Graft Removed Left    Thigh  . AV FISTULA PLACEMENT  07/29/2011   Procedure: ARTERIOVENOUS (AV) FISTULA CREATION;  Surgeon: Mal Misty, MD;  Location: [place];  Service: Vascular;  Laterality: Right;  Brachial cephalic  . AV FISTULA PLACEMENT Left 01/20/2015   Procedure: LIGATION OF RIGHT ARTERIOVENOUS FISTULA WITH EXCISION OF ANEURYSM;  Surgeon: Angelia Mould, MD;  Location: [place];  Service: Vascular;  Laterality: Left;  . AV FISTULA PLACEMENT Left 04/30/2015   Procedure: CREATION OF LEFT ARM BRACHIO-CEPHALIC ARTERIOVENOUS (AV) FISTULA ;  Surgeon: Angelia Mould, MD;  Location: [place];  Service: Vascular;  Laterality: Left;  . AV Graft PLACEMENT Left 1993?   "attempted one in my wrist; didn't take"  . BELOW KNEE LEG AMPUTATION Left 2010  . CARDIAC CATHETERIZATION  03/19/2014  . CARDIAC CATHETERIZATION  03/19/2014   Procedure: CORONARY BALLOON ANGIOPLASTY;  Surgeon: Burnell Blanks, MD;  Location: [facility] CATH LAB;  Service: Cardiovascular;;  . CARDIAC CATHETERIZATION N/A 01/01/2016   Procedure: Left Heart Cath and Coronary Angiography;  Surgeon: Troy Sine, MD;  Location: [place] CV LAB;  Service: Cardiovascular;  Laterality: N/A;  . CARDIAC CATHETERIZATION N/A 01/01/2016   Procedure: Coronary Stent Intervention;  Surgeon: Troy Sine, MD;  Location: [place] CV LAB;  Service: Cardiovascular;  Laterality: N/A;  . CARPAL TUNNEL RELEASE Bilateral after 2005  . CATARACT EXTRACTION W/ INTRAOCULAR LENS  IMPLANT, BILATERAL Bilateral 1990's  . COLONOSCOPY  08/29/2011   Procedure: COLONOSCOPY;   Surgeon: Jerene Bears, MD;  Location: [place];  Service: Gastroenterology;  Laterality: N/A;  . COLONOSCOPY WITH PROPOFOL N/A 04/26/2016   Procedure: COLONOSCOPY WITH PROPOFOL;  Surgeon: Jerene Bears, MD;  Location: [facility] ENDOSCOPY;  Service: Gastroenterology;  Laterality: N/A;  . CORONARY ANGIOPLASTY WITH STENT PLACEMENT  04/09/2014       ptca/des mid lad   . ESOPHAGOGASTRODUODENOSCOPY N/A 12/25/2015   Procedure: ESOPHAGOGASTRODUODENOSCOPY (EGD);  Surgeon: Mauri Pole, MD;  Location: [facility] ENDOSCOPY;  Service: Endoscopy;  Laterality: N/A;  bedside  . ESOPHAGOGASTRODUODENOSCOPY (EGD) WITH PROPOFOL N/A 04/26/2016   Procedure: ESOPHAGOGASTRODUODENOSCOPY (EGD) WITH PROPOFOL;  Surgeon: Jerene Bears, MD;  Location: [facility];  Service: Gastroenterology;  Laterality: N/A;  . EYE SURGERY Bilateral    cataract  . FEMORAL ARTERY REPAIR  04/09/2014   ANGIOSEAL  . INSERTION OF DIALYSIS CATHETER  08/01/2011   Procedure: INSERTION OF DIALYSIS CATHETER;  Surgeon: Rosetta Posner, MD;  Location: [place];  Service: Vascular;  Laterality: Right;  insertion of dialysis catheter on right internal jugular vein  . INSERTION OF DIALYSIS CATHETER Right 01/20/2015   Procedure: INSERTION OF DIALYSIS CATHETER - RIGHT INTERNAL JUGULAR ;  Surgeon: Angelia Mould, MD;  Location: [place];  Service: Vascular;  Laterality: Right;  . KIDNEY TRANSPLANT  04/25/1993  . LEFT HEART CATHETERIZATION WITH CORONARY ANGIOGRAM N/A 03/19/2014   Procedure: LEFT HEART CATHETERIZATION WITH CORONARY ANGIOGRAM;  Surgeon: Burnell Blanks, MD;  Location: [facility] CATH LAB;  Service: Cardiovascular;  Laterality: N/A;  . LYMPH NODE BIOPSY  08/10/2011   Procedure: LYMPH NODE BIOPSY;  Surgeon: Merrie Roof, MD;  Location: [place];  Service: General;  Laterality: Left;  left groin lymph biopsy  . NEUROPLASTY / TRANSPOSITION ULNAR NERVE AT ELBOW Left after 2005  . PATCH ANGIOPLASTY Left 08/09/2016   Procedure: LEFT  ARTERIOVENOUS FISTULA PATCH  ANGIOPLASTY;  Surgeon: Waynetta Sandy, MD;  Location: [place];  Service: Vascular;  Laterality: Left;  . PERCUTANEOUS CORONARY ROTOBLATOR INTERVENTION (PCI-R) N/A 04/09/2014   Procedure: PERCUTANEOUS CORONARY ROTOBLATOR INTERVENTION (PCI-R);  Surgeon: Burnell Blanks, MD;  Location: [facility] CATH LAB;  Service: Cardiovascular;  Laterality: N/A;  . PERIPHERAL VASCULAR BALLOON ANGIOPLASTY  07/11/2017   Procedure: PERIPHERAL VASCULAR BALLOON ANGIOPLASTY;  Surgeon: Waynetta Sandy, MD;  Location: [place] CV LAB;  Service: Cardiovascular;;  left arm AV fistula  . PERIPHERAL VASCULAR CATHETERIZATION N/A 12/25/2014   Procedure: Upper Extremity Venography;  Surgeon: Conrad [place], MD;  Location: [place] CV LAB;  Service: Cardiovascular;  Laterality: N/A;  . PERIPHERAL VASCULAR CATHETERIZATION Left 02/24/2016   Procedure: Fistulagram;  Surgeon: Waynetta Sandy, MD;  Location: [place] CV LAB;  Service: Cardiovascular;  Laterality: Left;  . PERIPHERAL VASCULAR CATHETERIZATION Left 02/24/2016   Procedure: Peripheral Vascular Intervention;  Surgeon: Waynetta Sandy, MD;  Location: [place] CV LAB;  Service: Cardiovascular;  Laterality: Left;  AV FISTULA  . PERITONEAL CATHETER INSERTION    . PERITONEAL CATHETER REMOVAL    . REVISON OF ARTERIOVENOUS FISTULA Right 05/25/8655   Procedure: PLICATION / REVISION OF ARTERIOVENOUS FISTULA;  Surgeon: Angelia Mould, MD;  Location: [place];  Service: Vascular;  Laterality: Right;  . REVISON OF ARTERIOVENOUS FISTULA Left 09/01/2015   Procedure: SUPERFICIALIZATION OF LEFT ARM BRACHIOCEPHALIC ARTERIOVENOUS FISTULA;  Surgeon: Angelia Mould, MD;  Location: [place];  Service: Vascular;  Laterality: Left;  . REVISON OF ARTERIOVENOUS FISTULA Left 08/09/2016   Procedure: REVISION OF ARTERIAL ANASTOMOSIS OF ARTERIOVENOUS FISTULA;  Surgeon: Waynetta Sandy, MD;  Location: [place];  Service: Vascular;  Laterality: Left;  .  SHOULDER ARTHROSCOPY Left ~ 2006   frozen  . TOE AMPUTATION Bilateral after 2005  . UMBILICAL HERNIA REPAIR  1990's  . VENOGRAM Right 07/28/2011   Procedure: VENOGRAM;  Surgeon: Conrad Allyn, MD;  Location: [facility] CATH LAB;  Service: Cardiovascular;  Laterality: Right;  . VITRECTOMY Bilateral    Family History  Problem Relation Age of Onset  . Colon polyps Father   . Diabetes Father   . Hypertension Father   . Hypertension Mother   . Diabetes Mother   . Hyperlipidemia Mother   .  Breast cancer Maternal Aunt   . Bone cancer Maternal Aunt   . Liver cancer Maternal Aunt   . Malignant hyperthermia Neg Hx   . Heart attack Neg Hx   . Stroke Neg Hx   . Colon cancer Neg Hx   . Stomach cancer Neg Hx   . Pancreatic cancer Neg Hx   . Esophageal cancer Neg Hx    Social History:  reports that he has never smoked. He has never used smokeless tobacco. He reports that he drinks alcohol. He reports that he does not use drugs. Allergies  Allergen Reactions  . Iodinated Diagnostic Agents Itching, Rash and Other (See Comments)    Systemic itching and transient rash appearance within hours of receiving contrast    . Prednisone Other (See Comments)    Increases sugar levels  . Adhesive [Tape] Itching and Other (See Comments)    Blisters on arm from adhesive tape at dialysis   . Amoxicillin Itching, Rash and Other (See Comments)    Has patient had a PCN reaction causing immediate rash, facial/tongue/throat swelling, SOB or lightheadedness with hypotension: Yes Has patient had a PCN reaction causing severe rash involving mucus membranes or skin necrosis: No Has patient had a PCN reaction that required hospitalization No Has patient had a PCN reaction occurring within the last 10 years: No If all of the above answers are "NO", then may proceed with Cephalosporin use.  . Clindamycin/Lincomycin Hives and Itching  . Enalapril Rash and Cough  . Mobic [Meloxicam] Hives and Rash  . Morphine Hives, Nausea  Only and Rash  . Brilinta [Ticagrelor] Nausea And Vomiting  . Ciprofloxacin Itching and Rash  . Clopidogrel Rash  . Codeine Hives, Itching and Rash  . Doxycycline Rash  . Hydrocodone Rash  . Levofloxacin Hives and Itching  . Oxycodone Rash  . Rocephin [Ceftriaxone] Itching   Prior to Admission medications   Medication Sig Start Date End Date Taking? Authorizing Provider  acetaminophen (TYLENOL) 500 MG tablet Take 1,000 mg by mouth every 4 (four) hours as needed for moderate pain or headache.  01/16/16  Yes [provider]  allopurinol (ZYLOPRIM) 100 MG tablet TAKE 2 TABLETS BY MOUTH EVERY DAY Patient taking differently: TAKE 200 MG BY MOUTH EVERY DAY 03/30/17  Yes Mercy Riding, MD  aspirin EC 81 MG tablet Take 81 mg by mouth at bedtime.   Yes [provider]  atorvastatin (LIPITOR) 40 MG tablet TAKE 1 TABLET(40 MG) BY MOUTH DAILY 12/20/16  Yes Mercy Riding, MD  calcium acetate (PHOSLO) 667 MG capsule Take 667-1,334 mg by mouth See admin instructions. Take 1327ms with meals and 6647m with snacks 09/19/12  Yes [provider]  cetirizine (ZYRTEC) 10 MG tablet Take 10 mg by mouth daily.   Yes [provider]  docusate sodium (COLACE) 100 MG capsule Take 200 mg by mouth 2 (two) times daily.    Yes [provider]  fenofibrate (TRICOR) 48 MG tablet Take 48 mg by mouth daily.    Yes [provider]  fluticasone (FLONASE) 50 MCG/ACT nasal spray Place 2 sprays into both nostrils daily as needed for allergies.    Yes [provider]  FOSRENOL 1000 MG PACK Take 1,000 mg by mouth 3 (three) times daily.  09/14/16  Yes [provider]  gabapentin (NEURONTIN) 300 MG capsule Take 300 mg by mouth 3 (three) times daily.   Yes [provider]  GLUCAGON EMERGENCY 1 MG injection Inject 1 mg into  the muscle daily as needed (low blood sugar).  01/19/12  Yes [provider]  HYDROmorphone (DILAUDID) 2 MG tablet Take 2 mg by  mouth 2 (two) times daily.    Yes [provider]  hydrOXYzine (ATARAX/VISTARIL) 10 MG tablet Take 10 mg by mouth every 8 (eight) hours as needed for anxiety or vomiting.    Yes [provider]  insulin lispro (HUMALOG) 100 UNIT/ML injection Inject 1-20 Units into the skin 3 (three) times daily. Blood glucose - 120, 1 unit for every 20 points over 120 ,  PT RECEIVES FROM INSULIN PUMP   Yes [provider]  levothyroxine (SYNTHROID, LEVOTHROID) 112 MCG tablet Take 112 mcg by mouth every evening. 2 hours after dinner   Yes [provider]  metoprolol tartrate (LOPRESSOR) 25 MG tablet Take 1 tablet (25 mg total) by mouth 2 (two) times daily. 03/29/17 07/12/17 Yes Burnell Blanks, MD  midodrine (PROAMATINE) 10 MG tablet Take 1 tablet (10 mg total) by mouth 3 (three) times daily. Takes 1 tab in mornings of dialysis and takes 2 tabs 30 mins before starting dialysis (m,w,f) 04/14/16  Yes Elgergawy, Silver Huguenin, MD  multivitamin (RENA-VIT) TABS tablet Take 1 tablet by mouth daily.   Yes [provider]  mupirocin ointment (BACTROBAN) 2 % Place 1 application into the nose 2 (two) times daily. 10/16/16  Yes Elgergawy, Silver Huguenin, MD  nitroGLYCERIN (NITROSTAT) 0.4 MG SL tablet Place 1 tablet (0.4 mg total) under the tongue every 5 (five) minutes as needed for chest pain. 02/15/16  Yes Burnell Blanks, MD  Nutritional Supplements (FEEDING SUPPLEMENT, NEPRO CARB STEADY,) LIQD Take 237 mLs by mouth daily. 01/04/16  Yes Florencia Reasons, MD  omeprazole (PRILOSEC OTC) 20 MG tablet Take 20 mg by mouth daily.   Yes [provider]  OXYGEN Inhale 3 L into the lungs continuous.    Yes [provider]  Polyethyl Glycol-Propyl Glycol (SYSTANE) 0.4-0.3 % SOLN Place 1 drop into both eyes as needed (dry eyes).   Yes [provider]  polyethylene glycol (MIRALAX / GLYCOLAX) packet Take 17 g by mouth daily. Patient taking differently: Take 17 g by mouth  daily as needed for mild constipation.  02/06/16  Yes Regalado, Belkys A, MD  prasugrel (EFFIENT) 10 MG TABS tablet TAKE ONE TABLET BY MOUTH EVERY DAY Patient taking differently: TAKE 10 MG BY MOUTH EVERY DAY 03/09/17  Yes Eileen Stanford, PA-C  promethazine (PHENERGAN) 25 MG tablet Take 1 tablet (25 mg total) by mouth every 6 (six) hours as needed for nausea or vomiting. For nausea 11/27/14  Yes Weaver, Scott T, PA-C  rOPINIRole (REQUIP) 2 MG tablet TAKE 1 TABLET(2 MG) BY MOUTH EVERY NIGHT 05/05/17  Yes Mercy Riding, MD  testosterone cypionate (DEPOTESTOSTERONE CYPIONATE) 200 MG/ML injection INJECT 200 MG INTO THE MUSCLE EVERY 14 DAYS 09/10/16  Yes [provider]  VELPHORO 500 MG chewable tablet  11/15/16  Yes [provider]  Wound Cleansers (WOUND [place]) LIQD Apply 1 application topically daily as needed (wound care).   Yes [provider]   Current Facility-Administered Medications  Medication Dose Route Frequency Provider Last Rate Last Dose  . aspirin EC tablet 81 mg  81 mg Oral QHS Shirley, [place], DO   81 mg at 07/12/17 2243  . atorvastatin (LIPITOR) tablet 40 mg  40 mg Oral q1800 Shirley, [place], DO   40 mg at 07/12/17 1757  . aztreonam (AZACTAM) 0.5 g in dextrose 5 % 50  mL IVPB  0.5 g Intravenous Q12H Wynell Balloon, RPH 100 mL/hr at 07/13/17 1050 0.5 g at 07/13/17 1050  . docusate sodium (COLACE) capsule 200 mg  200 mg Oral BID Shirley, [place], DO   200 mg at 07/12/17 2243  . fenofibrate tablet 54 mg  54 mg Oral Daily Enid Derry, [place], DO   54 mg at 07/13/17 0903  . gabapentin (NEURONTIN) capsule 300 mg  300 mg Oral BID Shirley, [place], DO   300 mg at 07/13/17 0903  . heparin injection 5,000 Units  5,000 Units Subcutaneous Q8H Leeanne Rio, MD   5,000 Units at 07/13/17 0529  . HYDROmorphone (DILAUDID) tablet 2 mg  2 mg Oral Q8H PRN Shirley, [place], DO      . insulin aspart (novoLOG) injection 0-15 Units  0-15 Units Subcutaneous Q4H Shirley, [place],  DO   3 Units at 07/13/17 0903  . levothyroxine (SYNTHROID, LEVOTHROID) tablet 112 mcg  112 mcg Oral Q24H Shirley, [place], DO   112 mcg at 07/12/17 2242  . ondansetron (ZOFRAN) injection 4 mg  4 mg Intravenous Q8H PRN Shirley, [place], DO   4 mg at 07/12/17 1917  . pantoprazole (PROTONIX) EC tablet 40 mg  40 mg Oral Daily Enid Derry, [place], DO   40 mg at 07/13/17 0903  . polyethylene glycol (MIRALAX / GLYCOLAX) packet 17 g  17 g Oral Daily PRN Shirley, [place], DO      . polyvinyl alcohol (LIQUIFILM TEARS) 1.4 % ophthalmic solution 1 drop  1 drop Both Eyes PRN Leeanne Rio, MD      . prasugrel (EFFIENT) tablet 10 mg  10 mg Oral Daily Enid Derry, [place], DO   10 mg at 07/13/17 0903  . rOPINIRole (REQUIP) tablet 2 mg  2 mg Oral QHS Enid Derry, [place], DO   2 mg at 07/12/17 2242  . [START ON 07/14/2017] vancomycin (VANCOCIN) IVPB 1000 mg/200 mL premix  1,000 mg Intravenous Q M,W,F-HD Wynell Balloon [facility]       Labs: Basic Metabolic Panel: Recent Labs  Lab 07/11/17 0629 07/12/17 1149 07/13/17 0455  NA 140 136 133*  K 5.0 4.1 4.5  CL 97* 96* 93*  CO2  --  24 25  GLUCOSE 169* 143* 262*  BUN 47* 22* 34*  CREATININE 8.40* 5.40* 7.86*  CALCIUM  --  8.4* 8.2*   Liver Function Tests: Recent Labs  Lab 07/12/17 1149 07/13/17 0455  AST 177* 115*  ALT 116* 102*  ALKPHOS 142* 167*  BILITOT 2.0* 3.5*  PROT 7.1 6.7  ALBUMIN 3.1* 2.9*   CBC: Recent Labs  Lab 07/11/17 0629 07/12/17 1149 07/13/17 0455  WBC  --  15.7* 9.6  NEUTROABS  --  14.3*  --   HGB 11.6* 10.3* 9.6*  HCT 34.0* 33.2* 31.8*  MCV  --  109.6* 112.4*  PLT  --  243 188   Cardiac Enzymes: Recent Labs  Lab 07/12/17 1149  TROPONINI <0.03   CBG: Recent Labs  Lab 07/12/17 1714 07/12/17 2240 07/13/17 0022 07/13/17 0402 07/13/17 0808  GLUCAP 133* 239* 242* 251* 196*   Studies/Results: Dg Chest 2 View  Result Date: 07/12/2017 CLINICAL DATA:  Dialysis patient.  Lethargy and fever. EXAM: CHEST - 2 VIEW COMPARISON:   10/14/2016 FINDINGS: Mild cardiac enlargement which is chronic. Pulmonary venous hypertension without edema. No infiltrate, collapse or effusion. No significant bone finding. IMPRESSION: Cardiomegaly and pulmonary venous hypertension.  Otherwise negative. Electronically Signed   By: Nelson Chimes M.D.   On: 07/12/2017  12:31    ROS: As per HPI Denies CP, SOB, cough, stools were pasty - now "almost diarrhea" , anuric, chronic pain worse when here because of having to stay in the bed, Significant nausea with stomach rumbling - zofran - not helping - phenergan woks better. On very high dose neurontin 300 tid for chronic pain  Pt self adjusts midodrine.   Physical Exam: Vitals:   07/12/17 2000 07/12/17 2001 07/12/17 2214 07/13/17 0540  BP: (!) 106/47  (!) 114/44 (!) 120/43  Pulse: 100 100 99 93  Resp: (!) 23 (!) 22 20 18   Temp:   99.3 F (37.4 C) 97.9 F (36.6 C)  TempSrc:   Oral Oral  SpO2: 90% 96%  99%  Weight:    130.9 kg (288 lb 9.3 oz)  Height:         General: large WM chronically ill Head: NCAT sclera not icteric MMM Neck: Supple, thick Lungs: CTA bilaterally without wheezes, rales, or rhonchi. Breathing is unlabored. Heart: RRR   Abdomen: obese protruberant + BS NT, prominent veins on right abd. Some RUQ tenderness Skin; erythema arms/legs/trunk/face Lower extremities: left BKA no sig edema, RLE brawny edema with dressings on shins Neuro: A & O  X 3. Moves all extremities spontaneously. Psych:  Responds to questions appropriately with a normal affect. Dialysis Access: left upper AVF slight erythema no obvious infection + bruit  Dialysis Orders: [place] 4.25 hr left upper AVF  EDW 129  450/800 2 K 2.25 Ca profile 2 var Na heparin 5000 hectorol 3 mircera 30 q 4 weeks last 3/6 no Fe Recent labs: hgb 10.5 stable, 41% sat Ca/P ok iPTH 487  Assessment/Plan: 1. Sepsis-^ WBC cultures pending - on Vanc and aztreonam- had 1250 cc NS in ED- BC no growth pending, influenza neg, CXR neg  infiltrate. WBC 15.7 > 9.6 today 2. ESRD -  MWF K 4.5 HD Friday.  S/p PTA of 80% stenosis near artrial anastomosis 3/19 with patent cephalic arch stent - Dr. Donzetta Matters 3. BP/volume  - on midodrine for BP support which he self doses up to 30 mg during HD - takes 10 mg at a time - plan start with 10 mg pre HD and then redose x 1 if BP <110. Recently had EDW raised 0. 4. Anemia  - hgb 9.6 redose due next Wed - if hgb continues to decline can redose sooner 5. Metabolic bone disease -  continue Hectorol- stakes 1 phoslo ac and 2 with largest meal , 1 with snacks - will just give 1 ac for now 6. Nutrition - renal carb mod/vits/ 7. DM - per primary- insulin pump 8. Chronic pain- meds per primary 9. GERD -PPI 10. HLD - on fenofibrate/statin 11. ^ LFT - still elevated- no recent outpt labs to compare - ? Hold statin - 12. Hypothyroidism - on synthroid 13. MRSA + contact isolation- need to continue this after d/c 14. Contrast allergy - had mild-mod erythematous skin reaction inspite of premeds of steroid/benadryl.  States this always happends. 15. Nausea - change zofran to phenergan per pt request  Myriam Jacobson, PA-C [place] [phone] 07/13/2017, 11:48 AM   Pt seen, examined and agree w A/P as above. ESRD pt with multiple medical issues here with fevers and lethargy, and hypotension, r/o sepsis.  Evaluation negative so far.  Plan HD tomorrow.  Kelly Splinter MD [name] pager [phone]   07/13/2017, 5:13 PM

## 2017-07-13 NOTE — Progress Notes (Addendum)
Family Medicine Teaching Service Daily Progress Note Intern Pager: [phone]  Patient name: Dale [name] Medical record number: [id number] Date of birth: 22-Aug-1965 Age: 52 y.o. Gender: male  Primary Care Provider: Mercy Riding, MD Consultants: Nephrology Code Status: Full  Pt Overview and Major Events to Date:  Dale [name] is a 52 y.o. male presenting with fever and hypotension . PMH is significant for ESRD on HD, Chronic diastolic CHF, CAD, Insulin-dependent T1DM, s/p L BKA, Hypothyroidism, Chronic pain, Gout, GERD, and HLD.  Assessment and Plan:  SIRS with unknown source Patient febrile with hypotension upon admission. Hypotension improved after receiving IVF. s/p fistulagram on 3/19. Patient does take midodrine 10 mg TID for hypotension. Blood cultures pending. Patient anuric.  - Vanc and aztreonam (3/20- ) per pharmacy for at least 48 hours of neg BCx - getting IVF with abx - Monitor I's/O's - Blood cultures - Influenza negative  ESRD on HD, MWF:s/p kidney transplant in 1995.  - Nephrology consulted and is following, will keep HD schedule -Pt is anuric and s/p fluid boluses -Follow daily wts and I's/O's  Chronic HFpEF: ECHO (11/01/16) with EF 60-65%, moderate LVH, grade1DD, elevated LV filling pressure, trivial MR, mild TR, RVSP 39 mmHg, normal IVC.On metoprolol 25mg  BID  -Follow daily wts and I/O -Holding beta-blocker d/t hypotension  CAD: hx of stents, No anginal complaints -EKG without acute changes -Troponin undetectable -Continue Lipitor, Effient, and ASA 81  Insulin-dependent T1DM: Insulin pump- Omnipod- changes every 3 days -A1c 5.7% 02/25/2016 -Managed at home with insulin pump -Check CBG's every 4 hours -mod SSI until pump available- wife bringing from home  Anemia: Hgb on admission 10.3, baseline appears to be 11.5-12.5 - Monitor on daily CBC  - Folate, Vit B12 pending  S/p L BKA: Chronic, stable. - Up with  assistance  Hypothyroidism: On synthroid 167mcg at home -TSH normal -Continue Synthroid  Chronic pain: Followed by rheumatology per patient. Attributed to low back degenerative disease. Continue home-regimen as tolerated. - increased to Dilaudid 2mg  TID prn while in hospital - gabapentin 300mg  TID (pt reports taking it BID)- given BID here- not renally dosed currently, will re-evaluate - Bowel regimen to prevent constipation  Gout: On allopurinol 200mg  daily  - Continue home dose of Allopurinol, which may need re-evaluation for renal adjustment  GERD: on Prilosec 40mg  BID per patient as well as Reglan starting 3 weeks ago - Protonix 40mg  daily  HLD: - fenofibrate and atorvastatin  Mild Transaminitis: improving, but patient reporting that he has RUQ pain - Repeat CMP tomorrow - RUQ [place] pending  Chronic Venous Stasis of RLE: - Wound care consulted  FEN/GI: Renal/carb modified diet, PPI Prophylaxis: Heparin per pharmacy  Disposition: continued inpatient stay  Subjective:  Patient feels better today.  Objective: Temp:  [97.9 F (36.6 C)-100.7 F (38.2 C)] 97.9 F (36.6 C) (03/21 0540) Pulse Rate:  [84-100] 93 (03/21 0540) Resp:  [13-25] 18 (03/21 0540) BP: (79-127)/(33-59) 120/43 (03/21 0540) SpO2:  [90 %-100 %] 99 % (03/21 0540) Weight:  [283 lb (128.4 kg)-288 lb 9.3 oz (130.9 kg)] 288 lb 9.3 oz (130.9 kg) (03/21 0540) Physical Exam: General: NAD, pleasant Eyes: PERRL, EOMI, no conjunctival pallor or injection ENTM: Moist mucous membranes Cardiovascular: RRR, no m/r/g, no LE edema Respiratory: CTA BL, normal work of breathing Gastrointestinal: soft, nontender, nondistended, normoactive BS MSK: moves 4 extremities equally, L BKA, R leg in compression stocking Derm: no rashes appreciated Neuro: CN II-XII grossly intact Psych: AOx3, appropriate affect  Laboratory: Recent Labs  Lab 07/11/17 0629 07/12/17 1149 07/13/17 0455  WBC  --  15.7* 9.6  HGB  11.6* 10.3* 9.6*  HCT 34.0* 33.2* 31.8*  PLT  --  243 188   Recent Labs  Lab 07/11/17 0629 07/12/17 1149 07/13/17 0455  NA 140 136 133*  K 5.0 4.1 4.5  CL 97* 96* 93*  CO2  --  24 25  BUN 47* 22* 34*  CREATININE 8.40* 5.40* 7.86*  CALCIUM  --  8.4* 8.2*  PROT  --  7.1 6.7  BILITOT  --  2.0* 3.5*  ALKPHOS  --  142* 167*  ALT  --  116* 102*  AST  --  177* 115*  GLUCOSE 169* 143* 262*   LA 1.87>1.09 Influenza negative Vitamin B12 430 TSH 1.517 MRSA positive  Imaging/Diagnostic Tests: Dg Chest 2 View  Result Date: 07/12/2017 CLINICAL DATA:  Dialysis patient.  Lethargy and fever. EXAM: CHEST - 2 VIEW COMPARISON:  10/14/2016 FINDINGS: Mild cardiac enlargement which is chronic. Pulmonary venous hypertension without edema. No infiltrate, collapse or effusion. No significant bone finding. IMPRESSION: Cardiomegaly and pulmonary venous hypertension.  Otherwise negative. Electronically Signed   By: Nelson Chimes M.D.   On: 07/12/2017 12:31    Jaycie Kregel, [place], DO 07/13/2017, 8:28 AM PGY-1, [place] Intern pager: [phone], text pages welcome

## 2017-07-13 NOTE — Progress Notes (Signed)
Inpatient Diabetes Program Recommendations  AACE/ADA: New Consensus Statement on Inpatient Glycemic Control (2015)  Target Ranges:  Prepandial:   less than 140 mg/dL      Peak postprandial:   less than 180 mg/dL (1-2 hours)      Critically ill patients:  140 - 180 mg/dL   Lab Results  Component Value Date   GLUCAP 182 (H) 07/13/2017   HGBA1C 5.7 (H) 02/25/2016    Review of Glycemic Control  Diabetes history: DM2 Outpatient Diabetes medications: Insulin pump Current orders for Inpatient glycemic control: OmniPod insulin pump  Pt states good glycemic control at home. Has Libre continuous glucose monitor.  Inpatient Diabetes Program Recommendations:     Continue with pt managing insulin pump.  Basal - 1.35 units MN 1.4 units 0400  1.35 units MN Bolus - CHO ratio1:10 CF - 20  Will continue to follow. Contract signed.  Thank you. Lorenda Peck, RD, LDN, CDE Inpatient Diabetes Coordinator [phone]

## 2017-07-13 NOTE — Telephone Encounter (Signed)
-----   Message from Mena Goes, RN sent at 07/11/2017 10:06 AM EDT ----- Regarding: 4-6 weeks w/ duplex   ----- Message ----- From: Waynetta Sandy, MD Sent: 07/11/2017   8:32 AM To: [address]  Dale [name] [id number] Aug 12, 1965  07/11/2017 Pre-operative Diagnosis: esrd, decreased clearance of left arm avf  Surgeon:  Dale Paschal. Donzetta Matters, MD  Procedure Performed: 1.  US guided cannulation of left arm avf 2.  shuntogram left arm 3.  Drug coated balloon angioplasty of av anastomosis with 45mm lutonix 4.  Moderate sedation with fentanyl and versed for 31 minutes  F/u in 4-6 weeks with dialysis duplex left arm.

## 2017-07-14 ENCOUNTER — Other Ambulatory Visit: Payer: Self-pay

## 2017-07-14 DIAGNOSIS — Z992 Dependence on renal dialysis: Secondary | ICD-10-CM

## 2017-07-14 DIAGNOSIS — R109 Unspecified abdominal pain: Secondary | ICD-10-CM

## 2017-07-14 DIAGNOSIS — N186 End stage renal disease: Secondary | ICD-10-CM

## 2017-07-14 LAB — COMPREHENSIVE METABOLIC PANEL
ALT: 78 U/L — ABNORMAL HIGH (ref 17–63)
AST: 71 U/L — ABNORMAL HIGH (ref 15–41)
Albumin: 2.8 g/dL — ABNORMAL LOW (ref 3.5–5.0)
Alkaline Phosphatase: 235 U/L — ABNORMAL HIGH (ref 38–126)
Anion gap: 15 (ref 5–15)
BUN: 49 mg/dL — ABNORMAL HIGH (ref 6–20)
CO2: 26 mmol/L (ref 22–32)
Calcium: 8.5 mg/dL — ABNORMAL LOW (ref 8.9–10.3)
Chloride: 92 mmol/L — ABNORMAL LOW (ref 101–111)
Creatinine, Ser: 9.94 mg/dL — ABNORMAL HIGH (ref 0.61–1.24)
GFR calc Af Amer: 6 mL/min — ABNORMAL LOW (ref >60)
GFR calc non Af Amer: 5 mL/min — ABNORMAL LOW (ref >60)
Glucose, Bld: 210 mg/dL — ABNORMAL HIGH (ref 65–99)
Potassium: 4.4 mmol/L (ref 3.5–5.1)
Sodium: 133 mmol/L — ABNORMAL LOW (ref 135–145)
Total Bilirubin: 2.2 mg/dL — ABNORMAL HIGH (ref 0.3–1.2)
Total Protein: 7 g/dL (ref 6.5–8.1)

## 2017-07-14 LAB — GLUCOSE, CAPILLARY
Glucose-Capillary: 165 mg/dL — ABNORMAL HIGH (ref 65–99)
Glucose-Capillary: 181 mg/dL — ABNORMAL HIGH (ref 65–99)
Glucose-Capillary: 193 mg/dL — ABNORMAL HIGH (ref 65–99)
Glucose-Capillary: 198 mg/dL — ABNORMAL HIGH (ref 65–99)
Glucose-Capillary: 91 mg/dL (ref 65–99)
Glucose-Capillary: 94 mg/dL (ref 65–99)

## 2017-07-14 LAB — FOLATE RBC
Folate, Hemolysate: 599.9 ng/mL
Folate, RBC: 1954 ng/mL (ref >498)
Hematocrit: 30.7 % — ABNORMAL LOW (ref 37.5–51.0)

## 2017-07-14 LAB — CBC
HCT: 30.1 % — ABNORMAL LOW (ref 39.0–52.0)
Hemoglobin: 9.3 g/dL — ABNORMAL LOW (ref 13.0–17.0)
MCH: 33.7 pg (ref 26.0–34.0)
MCHC: 30.9 g/dL (ref 30.0–36.0)
MCV: 109.1 fL — ABNORMAL HIGH (ref 78.0–100.0)
Platelets: 182 10*3/uL (ref 150–400)
RBC: 2.76 MIL/uL — ABNORMAL LOW (ref 4.22–5.81)
RDW: 14.4 % (ref 11.5–15.5)
WBC: 8.3 10*3/uL (ref 4.0–10.5)

## 2017-07-14 LAB — LIPASE, BLOOD: Lipase: 47 U/L (ref 11–51)

## 2017-07-14 LAB — VANCOMYCIN, RANDOM: Vancomycin Rm: 60

## 2017-07-14 LAB — GAMMA GT: GGT: 264 U/L — ABNORMAL HIGH (ref 7–50)

## 2017-07-14 MED ORDER — SODIUM CHLORIDE 0.9 % IV SOLN: 100.0000 mL | INTRAVENOUS | Status: DC | PRN

## 2017-07-14 MED ORDER — DOXERCALCIFEROL 4 MCG/2ML IV SOLN
INTRAVENOUS | Status: AC
  Filled 2017-07-14: qty 2

## 2017-07-14 MED ORDER — LIDOCAINE HCL (PF) 1 % IJ SOLN: 5.0000 mL | INTRAMUSCULAR | Status: DC | PRN

## 2017-07-14 MED ORDER — ALLOPURINOL 100 MG PO TABS
200.0000 mg | ORAL_TABLET | ORAL | Status: DC
  Administered 2017-07-17: 200 mg via ORAL
  Filled 2017-07-14: qty 2

## 2017-07-14 MED ORDER — HEPARIN SODIUM (PORCINE) 1000 UNIT/ML DIALYSIS: 20.0000 [IU]/kg | INTRAMUSCULAR | Status: DC | PRN

## 2017-07-14 MED ORDER — GABAPENTIN 100 MG PO CAPS
200.0000 mg | ORAL_CAPSULE | Freq: Every day | ORAL | Status: DC
  Administered 2017-07-15 – 2017-07-18 (×4): 200 mg via ORAL
  Filled 2017-07-14 (×4): qty 2

## 2017-07-14 MED ORDER — GABAPENTIN 100 MG PO CAPS
200.0000 mg | ORAL_CAPSULE | ORAL | Status: DC
  Administered 2017-07-14 – 2017-07-17 (×2): 200 mg via ORAL
  Filled 2017-07-14 (×2): qty 2

## 2017-07-14 MED ORDER — MIDODRINE HCL 5 MG PO TABS
ORAL_TABLET | ORAL | Status: AC
  Filled 2017-07-14: qty 2

## 2017-07-14 MED ORDER — HEPARIN SODIUM (PORCINE) 1000 UNIT/ML DIALYSIS: 1000.0000 [IU] | INTRAMUSCULAR | Status: DC | PRN

## 2017-07-14 MED ORDER — ALTEPLASE 2 MG IJ SOLR: 2.0000 mg | Freq: Once | INTRAMUSCULAR | Status: DC | PRN

## 2017-07-14 MED ORDER — PROMETHAZINE HCL 25 MG/ML IJ SOLN
INTRAMUSCULAR | Status: AC
  Filled 2017-07-14: qty 1

## 2017-07-14 MED ORDER — PROMETHAZINE HCL 25 MG/ML IJ SOLN
12.5000 mg | Freq: Four times a day (QID) | INTRAMUSCULAR | Status: DC | PRN
  Administered 2017-07-14 – 2017-07-15 (×4): 12.5 mg via INTRAVENOUS
  Filled 2017-07-14 (×4): qty 1

## 2017-07-14 MED ORDER — LIDOCAINE-PRILOCAINE 2.5-2.5 % EX CREA: 1.0000 "application " | TOPICAL_CREAM | CUTANEOUS | Status: DC | PRN

## 2017-07-14 MED ORDER — PENTAFLUOROPROP-TETRAFLUOROETH EX AERO: 1.0000 "application " | INHALATION_SPRAY | CUTANEOUS | Status: DC | PRN

## 2017-07-14 NOTE — Progress Notes (Signed)
   Patient recent avf cannulation site was evaluated and appears free of complication/infection. Fistula with improved thrill and ok to continue to use. Please contact me with further issues.   Derold Dorsch C. Donzetta Matters, MD [facility] Office: [phone] Pager: [phone]

## 2017-07-14 NOTE — Progress Notes (Signed)
[facility] Progress Note  Subjective: feeling better  Vitals:   07/14/17 1100 07/14/17 1130 07/14/17 1200 07/14/17 1250  BP: (!) 121/50 (!) 114/47 (!) 125/32 (!) 113/43  Pulse: 80 80 83 84  Resp: 20 20 20  (!) 21  Temp:    98.1 F (36.7 C)  TempSrc:    Oral  SpO2:    99%  Weight:    127.7 kg (281 lb 8.4 oz)  Height:        Inpatient medications: . [START ON 07/17/2017] allopurinol  200 mg Oral Q M,W,F-HD  . aspirin EC  81 mg Oral QHS  . atorvastatin  40 mg Oral q1800  . calcium acetate  667 mg Oral TID WC  . [START ON 07/19/2017] darbepoetin (ARANESP) injection - DIALYSIS  40 mcg Intravenous Q Wed-HD  . doxercalciferol  3 mcg Intravenous Q M,W,F-HD  . fenofibrate  54 mg Oral Daily  . [START ON 07/15/2017] gabapentin  200 mg Oral Daily  . gabapentin  200 mg Oral Q M,W,F-1800  . heparin injection (subcutaneous)  5,000 Units Subcutaneous Q8H  . levothyroxine  112 mcg Oral Q24H  . midodrine  10 mg Oral Q M,W,F-HD  . multivitamin  1 tablet Oral QHS  . pantoprazole  40 mg Oral Daily  . prasugrel  10 mg Oral Daily  . rOPINIRole  2 mg Oral QHS   . insulin pump    . piperacillin-tazobactam (ZOSYN)  IV 3.375 g (07/14/17 1333)   diphenhydrAMINE, docusate sodium, HYDROmorphone, midodrine, polyethylene glycol, polyvinyl alcohol, promethazine  Exam: General: large WM chronically ill Neck: Supple, thick Lungs: CTA bilaterally without wheezes, rales, or rhonchi. Breathing is unlabored. Heart: RRR   Abdomen: obese protruberant + BS NT, prominent veins on right abd. Some RUQ tenderness Skin; erythema arms/legs/trunk/face Lower extremities: left BKA no sig edema, RLE brawny edema with dressings on shins Neuro: A & O  X 3 Access: left upper AVF slight erythema no obvious infection + bruit  Dialysis Orders: [facility] MWF 4h 45min  LUA AVF   129kg  450/800  2/2.25 bath  P2  Hep 5000 -hectorol 3  -mircera 30 q 4 weeks last 3/6 -no Fe   Recent labs: hgb 10.5 stable,  41% sat Ca/P ok iPTH 487  Assessment: 1. Sepsis/ fevers/ hypotension-^ WBC,  on Vanc and aztreonam, BC no growth, influenza neg, CXR neg infiltrate. WBC down 2. ESRD -  MWF K 4.5 HD Friday.  S/p PTA  80% stenosis near artrial anastomosis 3/19 with patent cephalic arch stent - Dr. Donzetta Matters saw and s/o 3. BP/volume  - on midodrine for BP support which he self doses up to 30 mg during HD - takes 10 mg at a time - plan start with 10 mg pre HD and then redose x 1 if BP <110. Recently had EDW raised 0. 4. Anemia  - hgb 9.6 redose due next Wed - if hgb continues to decline can redose sooner 5. Metabolic bone disease -  continue Hectorol- stakes 1 phoslo ac and 2 with largest meal , 1 with snacks - will just give 1 ac for now 6. Nutrition - renal carb mod/vits/ 7. DM - per primary- insulin pump 8. Chronic pain- meds per primary 9. GERD -PPI 10. HLD - on fenofibrate/statin 11. ^ LFT - still elevated- no recent outpt labs to compare - ? Hold statin - 12. Hypothyroidism - on synthroid 13. MRSA + contact isolation- need to continue this after d/c 14. Contrast allergy -  had mild-mod erythematous skin reaction inspite of premeds of steroid/benadryl.  States this always happens 15. Nausea - change zofran to phenergan per pt request    Plan - HD today   Kelly Splinter MD [facility] pager [phone]   07/14/2017, 2:24 PM   Recent Labs  Lab 07/12/17 1149 07/13/17 0455 07/14/17 0429  NA 136 133* 133*  K 4.1 4.5 4.4  CL 96* 93* 92*  CO2 24 25 26   GLUCOSE 143* 262* 210*  BUN 22* 34* 49*  CREATININE 5.40* 7.86* 9.94*  CALCIUM 8.4* 8.2* 8.5*   Recent Labs  Lab 07/12/17 1149 07/13/17 0455 07/14/17 0429  AST 177* 115* 71*  ALT 116* 102* 78*  ALKPHOS 142* 167* 235*  BILITOT 2.0* 3.5* 2.2*  PROT 7.1 6.7 7.0  ALBUMIN 3.1* 2.9* 2.8*   Recent Labs  Lab 07/12/17 1149 07/13/17 0455 07/14/17 0429  WBC 15.7* 9.6 8.3  NEUTROABS 14.3*  --   --   HGB 10.3* 9.6* 9.3*  HCT 33.2*  31.8*  30.7* 30.1*  MCV 109.6* 112.4* 109.1*  PLT 243 188 182   Iron/TIBC/Ferritin/ %Sat    Component Value Date/Time   IRON 26 (L) 02/03/2016 1454   TIBC 132 (L) 02/03/2016 1454   FERRITIN 1,022 (H) 02/03/2016 1454   IRONPCTSAT 20 02/03/2016 1454

## 2017-07-14 NOTE — Progress Notes (Addendum)
Family Medicine Teaching Service Daily Progress Note Intern Pager: [phone]  Patient name: Dale [name] Medical record number: [id number] Date of birth: 1965/09/10 Age: 52 y.o. Gender: male  Primary Care Provider: Mercy Riding, MD Consultants: Nephrology Code Status: Full  Pt Overview and Major Events to Date:  Dale [name] is a 52 y.o. male presenting with fever and hypotension . PMH is significant for ESRD on HD, Chronic diastolic CHF, CAD, Insulin-dependent T1DM, s/p L BKA, Hypothyroidism, Chronic pain, Gout, GERD, and HLD.  Assessment and Plan:  SIRS with unknown source Patient febrile with hypotension upon admission. Hypotension improved after receiving IVF. s/p fistulagram on 3/19. Patient does take midodrine 10 mg TID for hypotension. Patient anuric.  - Vanc (3/20-), Zosyn (3/21-) and aztreonam (3/20-31 ) per pharmacy for at least 48 hours of neg BCx - getting IVF with abx - Monitor I's/O's - Blood cultures with NGx1d  ESRD on HD, MWF:s/p kidney transplant in 1995.  - Nephrology consulted and is following, will keep HD schedule -Pt is anuric and s/p fluid boluses -Follow daily wts and I's/O's -Patient in HD today and wait up to 291 from 283 on admission  Insulin-dependent T1DM: Insulin pump- Omnipod- changes every 3 days -A1c 5.7% 02/25/2016 -Continue insulin pump -Check CBG's every 4 hours  Mild Transaminitis: improving, but patient reporting that he has RUQ pain - Repeat CMP tomorrow - RUQ [place] with not good visualization of liver, and one stone noted in gallbladder - GGT and lipase pending  Chronic HFpEF: ECHO (11/01/16) with EF 60-65%, moderate LVH, grade1DD, elevated LV filling pressure, trivial MR, mild TR, RVSP 39 mmHg, normal IVC.On metoprolol 25mg  BID  -Follow daily wts and I/O -Holding beta-blocker d/t hypotension  CAD: hx of stents, No anginal complaints -EKG without acute changes -Troponin undetectable -Continue Lipitor,  Effient, and ASA 81  Anemia: Hgb on admission 10.3> 9.3- likely dilutional, baseline appears to be 11.5-12.5 - Monitor on daily CBC  - Folate Pending, Vit B12 430  S/p L BKA: Chronic, stable. - Up with assistance  Hypothyroidism: On synthroid 150mcg at home -TSH normal -Continue Synthroid  Chronic pain: Followed by rheumatology per patient. Attributed to low back degenerative disease. Continue home-regimen as tolerated. - increased to Dilaudid 2mg  TID prn while in hospital - gabapentin 300mg  TID (pt reports taking it BID)-we will renally dose to gabapentin 200 mg/day with patient getting an extra 200 mg after HD sessions. - Bowel regimen to prevent constipation  Gout: On allopurinol 200mg  daily  - Will renally dose to 200 mg on days with HD  GERD: on Prilosec 40mg  BID per patient as well as Reglan starting 3 weeks ago - Protonix 40mg  daily  HLD: - fenofibrate and atorvastatin  Chronic Venous Stasis of RLE: - Wound care consulted  FEN/GI: Renal/carb modified diet, PPI Prophylaxis: Heparin per pharmacy  Disposition: continued inpatient stay  Subjective:  Patient is HD today and is a little out of it due to just having some Phenergan because he has been nauseated.  Objective: Temp:  [97.5 F (36.4 C)-98.4 F (36.9 C)] 98.4 F (36.9 C) (03/22 0546) Pulse Rate:  [84-91] 87 (03/22 0546) Resp:  [18-19] 18 (03/22 0546) BP: (112-131)/(22-55) 116/22 (03/22 0546) SpO2:  [91 %-99 %] 99 % (03/22 0546) Weight:  [291 lb 14.2 oz (132.4 kg)] 291 lb 14.2 oz (132.4 kg) (03/22 0546) Physical Exam: General: NAD, pleasant, in HD Eyes: PERRL, EOMI, no conjunctival pallor or injection ENTM: Moist mucous membranes Cardiovascular: RRR,  no m/r/g, no LE edema Respiratory: CTA BL- difficult due to body habitus, normal work of breathing Gastrointestinal: soft, nontender, nondistended, hard in lower abdomen, obese MSK: s/p L BKA Derm: no rashes appreciated   Laboratory: Recent  Labs  Lab 07/12/17 1149 07/13/17 0455 07/14/17 0429  WBC 15.7* 9.6 8.3  HGB 10.3* 9.6* 9.3*  HCT 33.2* 31.8* 30.1*  PLT 243 188 182   Recent Labs  Lab 07/12/17 1149 07/13/17 0455 07/14/17 0429  NA 136 133* 133*  K 4.1 4.5 4.4  CL 96* 93* 92*  CO2 24 25 26   BUN 22* 34* 49*  CREATININE 5.40* 7.86* 9.94*  CALCIUM 8.4* 8.2* 8.5*  PROT 7.1 6.7 7.0  BILITOT 2.0* 3.5* 2.2*  ALKPHOS 142* 167* 235*  ALT 116* 102* 78*  AST 177* 115* 71*  GLUCOSE 143* 262* 210*   LA 1.87>1.09 Influenza negative Vitamin B12 430 TSH 1.517 MRSA positive  Imaging/Diagnostic Tests: Dg Chest 2 View  Result Date: 07/12/2017 CLINICAL DATA:  Dialysis patient.  Lethargy and fever. EXAM: CHEST - 2 VIEW COMPARISON:  10/14/2016 FINDINGS: Mild cardiac enlargement which is chronic. Pulmonary venous hypertension without edema. No infiltrate, collapse or effusion. No significant bone finding. IMPRESSION: Cardiomegaly and pulmonary venous hypertension.  Otherwise negative. Electronically Signed   By: Nelson Chimes M.D.   On: 07/12/2017 12:31    Tasean Mancha, [place], DO 07/14/2017, 9:40 AM PGY-1, [place] Intern pager: [phone], text pages welcome

## 2017-07-14 NOTE — Progress Notes (Addendum)
Patient educated regarding HD trmt today and verbalized understanding.  Overall, tolerated trmt well.  Received panic vanc level result at 1004-notified [employer], PA-c.  Vancomycin not given today.  Post trmt, dialyzer noted lightly streaked with blood and yellow in color.  Liver enzymes have been elevated this admission.  K Stovall, PA-c notified of finding.  Patient returned to room in stable condition.

## 2017-07-14 NOTE — Plan of Care (Signed)

## 2017-07-14 NOTE — Consult Note (Signed)
[place] Nurse wound consult note Reason for Consult: Patient is requesting an unna boot to the right lower leg Wound type: The shin of the right lower leg has two small, partial thickness areas of abrasion.  The one lowest on the leg the patient states he scrapped when putting on his compression hose.  The area mid-shin he states occurred when he bumped his leg on the bed frame.  Both had foam dressing in place. Patient states he has compression hose at home.  He called his wife and asked her if she could bring them today.  He reports she is "unsure" if she will make it here today.  For now, we will continue the use of foam dressings, apply a TED hose, and I encouraged him to keep his leg elevated. Thank you for the consult.  Discussed plan of care with the patient and bedside nurse.  [place] nurse will not follow at this time.  Please re-consult the [place] team if needed.  Val Riles, RN, MSN, CWOCN, CNS-BC, pager [phone]

## 2017-07-14 NOTE — Progress Notes (Signed)
Pharmacy Antibiotic Note  Dale [name] is a 52 y.o. male admitted on 07/12/2017 with sepsis.  Patient ESRD on HD MWF, last session was 3/20 PTA (incomplete treatment).  Patient was incorrectly given two doses of vancomycin 2500mg  IV. Pre-HD vancomyicn level ordered today and was elevated at 22mcg/mL. Patient will need at least 2 full HD sessions before a re-dose of vancomycin is needed.  Plan: Zosyn 3.375g IV q12h EI Hold vancomycin for now Follow c/s, clinical progression, HD schedule/tolerance, levels PRN  Height: 5\' 8"  (172.7 cm) Weight: 291 lb 14.2 oz (132.4 kg) IBW/kg (Calculated) : 68.4  Temp (24hrs), Avg:97.9 F (36.6 C), Min:97.5 F (36.4 C), Max:98.4 F (36.9 C)  Recent Labs  Lab 07/11/17 0629 07/12/17 1149 07/12/17 1222 07/12/17 1458 07/13/17 0455 07/14/17 0429 07/14/17 0836  WBC  --  15.7*  --   --  9.6 8.3  --   CREATININE 8.40* 5.40*  --   --  7.86* 9.94*  --   LATICACIDVEN  --   --  1.87 1.09  --   --   --   VANCORANDOM  --   --   --   --   --   --  60*    Estimated Creatinine Clearance: 11.7 mL/min (A) (by C-G formula based on SCr of 9.94 mg/dL (H)).    Allergies  Allergen Reactions  . Iodinated Diagnostic Agents Itching, Rash and Other (See Comments)    Systemic itching and transient rash appearance within hours of receiving contrast    . Prednisone Other (See Comments)    Increases sugar levels  . Adhesive [Tape] Itching and Other (See Comments)    Blisters on arm from adhesive tape at dialysis   . Amoxicillin Itching, Rash and Other (See Comments)    Has patient had a PCN reaction causing immediate rash, facial/tongue/throat swelling, SOB or lightheadedness with hypotension: Yes Has patient had a PCN reaction causing severe rash involving mucus membranes or skin necrosis: No Has patient had a PCN reaction that required hospitalization No Has patient had a PCN reaction occurring within the last 10 years: No If all of the above answers are "NO",  then may proceed with Cephalosporin use.  . Clindamycin/Lincomycin Hives and Itching  . Enalapril Rash and Cough  . Mobic [Meloxicam] Hives and Rash  . Morphine Hives, Nausea Only and Rash  . Brilinta [Ticagrelor] Nausea And Vomiting  . Ciprofloxacin Itching and Rash  . Clopidogrel Rash  . Codeine Hives, Itching and Rash  . Doxycycline Rash  . Hydrocodone Rash  . Levofloxacin Hives and Itching  . Oxycodone Rash  . Rocephin [Ceftriaxone] Itching    Zosyn 3/21>> Aztreonam 3/20>>3/21 Vancomycin 3/20>>  3/22 pre-HD VR = 60   3/20 BCx: ngtd 3/20 MRSA PCR + 3/20 flu neg   Dale [name] D. Cavan Bearden, PharmD, BCPS Clinical Pharmacist Clinical Phone for 07/14/2017 until 3:30pm: [phone] If after 3:30pm, please call main pharmacy at [phone] 07/14/2017 10:32 AM

## 2017-07-15 DIAGNOSIS — R945 Abnormal results of liver function studies: Secondary | ICD-10-CM

## 2017-07-15 DIAGNOSIS — R110 Nausea: Secondary | ICD-10-CM

## 2017-07-15 DIAGNOSIS — R12 Heartburn: Secondary | ICD-10-CM

## 2017-07-15 DIAGNOSIS — E109 Type 1 diabetes mellitus without complications: Secondary | ICD-10-CM

## 2017-07-15 DIAGNOSIS — R1011 Right upper quadrant pain: Secondary | ICD-10-CM

## 2017-07-15 LAB — CBC
HCT: 31 % — ABNORMAL LOW (ref 39.0–52.0)
Hemoglobin: 9.8 g/dL — ABNORMAL LOW (ref 13.0–17.0)
MCH: 34 pg (ref 26.0–34.0)
MCHC: 31.6 g/dL (ref 30.0–36.0)
MCV: 107.6 fL — ABNORMAL HIGH (ref 78.0–100.0)
Platelets: 190 10*3/uL (ref 150–400)
RBC: 2.88 MIL/uL — ABNORMAL LOW (ref 4.22–5.81)
RDW: 14.3 % (ref 11.5–15.5)
WBC: 7.4 10*3/uL (ref 4.0–10.5)

## 2017-07-15 LAB — COMPREHENSIVE METABOLIC PANEL
ALT: 71 U/L — ABNORMAL HIGH (ref 17–63)
AST: 56 U/L — ABNORMAL HIGH (ref 15–41)
Albumin: 2.7 g/dL — ABNORMAL LOW (ref 3.5–5.0)
Alkaline Phosphatase: 355 U/L — ABNORMAL HIGH (ref 38–126)
Anion gap: 18 — ABNORMAL HIGH (ref 5–15)
BUN: 26 mg/dL — ABNORMAL HIGH (ref 6–20)
CO2: 22 mmol/L (ref 22–32)
Calcium: 8.2 mg/dL — ABNORMAL LOW (ref 8.9–10.3)
Chloride: 89 mmol/L — ABNORMAL LOW (ref 101–111)
Creatinine, Ser: 7.42 mg/dL — ABNORMAL HIGH (ref 0.61–1.24)
GFR calc Af Amer: 9 mL/min — ABNORMAL LOW (ref >60)
GFR calc non Af Amer: 8 mL/min — ABNORMAL LOW (ref >60)
Glucose, Bld: 244 mg/dL — ABNORMAL HIGH (ref 65–99)
Potassium: 4 mmol/L (ref 3.5–5.1)
Sodium: 129 mmol/L — ABNORMAL LOW (ref 135–145)
Total Bilirubin: 2.2 mg/dL — ABNORMAL HIGH (ref 0.3–1.2)
Total Protein: 6.9 g/dL (ref 6.5–8.1)

## 2017-07-15 LAB — GLUCOSE, CAPILLARY
Glucose-Capillary: 73 mg/dL (ref 65–99)
Glucose-Capillary: 184 mg/dL — ABNORMAL HIGH (ref 65–99)
Glucose-Capillary: 218 mg/dL — ABNORMAL HIGH (ref 65–99)
Glucose-Capillary: 207 mg/dL — ABNORMAL HIGH (ref 65–99)
Glucose-Capillary: 137 mg/dL — ABNORMAL HIGH (ref 65–99)
Glucose-Capillary: 137 mg/dL — ABNORMAL HIGH (ref 65–99)
Glucose-Capillary: 83 mg/dL (ref 65–99)
Glucose-Capillary: 150 mg/dL — ABNORMAL HIGH (ref 65–99)

## 2017-07-15 MED ORDER — PANTOPRAZOLE SODIUM 40 MG PO TBEC
40.0000 mg | DELAYED_RELEASE_TABLET | Freq: Once | ORAL | Status: AC
  Administered 2017-07-15: 40 mg via ORAL
  Filled 2017-07-15: qty 1

## 2017-07-15 MED ORDER — PANTOPRAZOLE SODIUM 40 MG PO TBEC
40.0000 mg | DELAYED_RELEASE_TABLET | Freq: Two times a day (BID) | ORAL | Status: DC
  Administered 2017-07-15 – 2017-07-18 (×5): 40 mg via ORAL
  Filled 2017-07-15 (×5): qty 1

## 2017-07-15 MED ORDER — PRO-STAT SUGAR FREE PO LIQD
30.0000 mL | Freq: Two times a day (BID) | ORAL | Status: DC
  Administered 2017-07-15 – 2017-07-17 (×5): 30 mL via ORAL
  Filled 2017-07-15 (×6): qty 30

## 2017-07-15 MED ORDER — PROMETHAZINE HCL 25 MG/ML IJ SOLN
12.5000 mg | Freq: Four times a day (QID) | INTRAMUSCULAR | Status: DC | PRN
  Administered 2017-07-15 – 2017-07-18 (×8): 12.5 mg via INTRAVENOUS
  Filled 2017-07-15 (×7): qty 1

## 2017-07-15 MED ORDER — PROMETHAZINE HCL 25 MG PO TABS: 25.0000 mg | ORAL_TABLET | Freq: Two times a day (BID) | ORAL | Status: DC | PRN

## 2017-07-15 MED ORDER — PROMETHAZINE HCL 25 MG PO TABS
12.5000 mg | ORAL_TABLET | ORAL | Status: DC | PRN
  Administered 2017-07-15: 12.5 mg via ORAL
  Filled 2017-07-15: qty 1

## 2017-07-15 NOTE — Consult Note (Addendum)
Referring Provider: Dr. Ardelia Mems Primary Care Physician:  Mercy Riding, MD Primary Gastroenterologist:  Dr. Hilarie Fredrickson  Reason for Consultation:  Elevated LFT's   HPI: Dale [name] is a 52 y.o. male with a very complicated medical history including end-stage renal disease with previous renal transplant that has also now failed and he is back on dialysis for the past few years on MWF, coronary artery disease with drug-eluting stents placed in September 2017 requiring Effient, insulin-dependent diabetes mellitus, oxygen dependent at nighttime, issues with previous difficult intubations, and left BKA with prosthesis.  He presented to [facility] on 3/20 with fever and hypotension during dialysis.  Had fistulogram on 3/19.  Has been on zosyn since admission for diagnosis of sepsis/SIRS of unknown source.  Blood cultures negative so far.  LFT's elevated upon presentation with AST 177, ALT 116, ALP 142, and total bili 2.0.  AST and ALT have trended down to 56 and 71 but ALP has increased to 355.  Total bili fairly stable at 2.2.    He tells me that recently he's had a lot more indigestion/reflux and nausea.  He PPI was recently increased due to these symptoms.  Was not really having any complaints of abdominal pain, but notices RUQ tenderness since everyone has been examining him.    Ultrasound showed the following, but did not appear to be a great study:  IMPRESSION: Single dependent gallstone 10 mm diameter within gallbladder with minimal gallbladder wall thickening though no definite sonographic Murphy sign or pericholecystic fluid are identified.  Incomplete hepatic visualization likely due to body habitus.   Past Medical History:  Diagnosis Date  . Anal fissure   . Anemia of chronic disease   . Arthritis    "hands, right knee" (03/19/2014) no cartlidge in right knee  . CAD (coronary artery disease)    a. Abnl nuc ->LHC (11/15):  mid to dist Dx 80%, mid RCA 99% (functional CTO), dist RCA 80%  >> PCI: balloon angioplasty to mid RCA (could not deliver stent) - staged PCI Rotoblator atherectomy/DES to [facility]. b. NSTEMI 12/2015 s/p DES to diagonal.  . Cholecystitis    a. 08/27/2011  . Chronic diastolic CHF (congestive heart failure) ([place])    a. Echo 3/13:  EF 55-60%;  b. Echo (11/15):  Mild LVH, EF 60-65%, no RWMA, Gr 1 DD, MAC, mild LAE, normal RVF, mild RAE;  c.  Echo 5/16:  severe LVH, EF 55-60%, no RWMA, Gr 1 DD, MAC, mild LAE  . Claustrophobia    when things get around his face.   . Complication of anesthesia   . Difficult intubation    only once in 2015 at [facility] haven't had a problem since then  . Diverticulosis   . ESRD (end stage renal disease) ([place])    a. 1995 s/p cadaveric transplant w/ susbequent failure after 18 yrs;  b.Dialysis initiated 07/2011 [facility] M-W-F  . Fibromyalgia   . Gastroparesis   . GERD (gastroesophageal reflux disease)   . Gout    PMH  . History of blood transfusion   . HLD (hyperlipidemia)   . Hypertension   . Hypothyroidism   . Inguinal lymphadenopathy    a. bilateral - s/p biopsy 07/2011  . Insulin dependent diabetes mellitus (HCC)    Type I  . Monilial esophagitis ([place])   . Morbid obesity ([place])   . OSA (obstructive sleep apnea)    adjustable bed  . PAD (peripheral artery disease) ([place])    a.  s/p L BKA.  [name] Kitchen Pericardial effusion    a.  Small by CT 08/22/11;  b.  Large by CT 08/27/11. c. Not seen on 11/2015 echo.  . Pneumonia ~ 2007  . PONV (postoperative nausea and vomiting)    vomited once after a procedure.  . T1DM (type 1 diabetes mellitus) ([place])   . Tracheobronchomalacia     Past Surgical History:  Procedure Laterality Date  . A/V FISTULAGRAM Left 07/26/2016   Procedure: A/V Fistulagram;  Surgeon: Waynetta Sandy, MD;  Location: [place] [facility];  Service: Cardiovascular;  Laterality: Left;  . A/V FISTULAGRAM N/A 07/11/2017   Procedure: A/V FISTULAGRAM - Left AV;  Surgeon: Waynetta Sandy, MD;  Location: [place] [facility];  Service: Cardiovascular;  Laterality: N/A;  . AMPUTATION OF REPLICATED TOES Right   . ANGIOPLASTY  04/09/2014   RCA   . APPENDECTOMY  ~ 2006  . ARTERIOVENOUS GRAFT PLACEMENT Left 1993?   forearm  . ARTERIOVENOUS GRAFT PLACEMENT Right 1993?   leg  . ARTERIOVENOUS GRAFT PLACEMENT Left    Thigh  . Arteriovenous Graft Removed Left    Thigh  . AV FISTULA PLACEMENT  07/29/2011   Procedure: ARTERIOVENOUS (AV) FISTULA CREATION;  Surgeon: Mal Misty, MD;  Location: [place];  Service: Vascular;  Laterality: Right;  Brachial cephalic  . AV FISTULA PLACEMENT Left 01/20/2015   Procedure: LIGATION OF RIGHT ARTERIOVENOUS FISTULA WITH EXCISION OF ANEURYSM;  Surgeon: Angelia Mould, MD;  Location: [place];  Service: Vascular;  Laterality: Left;  . AV FISTULA PLACEMENT Left 04/30/2015   Procedure: CREATION OF LEFT ARM BRACHIO-CEPHALIC ARTERIOVENOUS (AV) FISTULA ;  Surgeon: Angelia Mould, MD;  Location: [place];  Service: Vascular;  Laterality: Left;  . AV Graft PLACEMENT Left 1993?   "attempted one in my wrist; didn't take"  . BELOW KNEE LEG AMPUTATION Left 2010  . CARDIAC CATHETERIZATION  03/19/2014  . CARDIAC CATHETERIZATION  03/19/2014   Procedure: CORONARY BALLOON ANGIOPLASTY;  Surgeon: Burnell Blanks, MD;  Location: [facility] CATH LAB;  Service: Cardiovascular;;  . CARDIAC CATHETERIZATION N/A 01/01/2016   Procedure: Left Heart Cath and Coronary Angiography;  Surgeon: Troy Sine, MD;  Location: [place] [facility];  Service: Cardiovascular;  Laterality: N/A;  . CARDIAC CATHETERIZATION N/A 01/01/2016   Procedure: Coronary Stent Intervention;  Surgeon: Troy Sine, MD;  Location: [place] [facility];  Service: Cardiovascular;  Laterality: N/A;  . CARPAL TUNNEL RELEASE Bilateral after 2005  . CATARACT EXTRACTION W/ INTRAOCULAR LENS  IMPLANT, BILATERAL Bilateral 1990's  . COLONOSCOPY  08/29/2011   Procedure: COLONOSCOPY;  Surgeon: Jerene Bears, MD;  Location: [place];   Service: Gastroenterology;  Laterality: N/A;  . COLONOSCOPY WITH PROPOFOL N/A 04/26/2016   Procedure: COLONOSCOPY WITH PROPOFOL;  Surgeon: Jerene Bears, MD;  Location: [facility] END[facility];  Service: Gastroenterology;  Laterality: N/A;  . CORONARY ANGIOPLASTY WITH STENT PLACEMENT  04/09/2014       ptca/des mid lad   . ESOPHAGOGASTRODUODENOSCOPY N/A 12/25/2015   Procedure: ESOPHAGOGASTRODUODENOSCOPY (EGD);  Surgeon: Mauri Pole, MD;  Location: [facility] ENDOSCOPY;  Service: Endoscopy;  Laterality: N/A;  bedside  . ESOPHAGOGASTRODUODENOSCOPY (EGD) WITH PROPOFOL N/A 04/26/2016   Procedure: ESOPHAGOGASTRODUODENOSCOPY (EGD) WITH PROPOFOL;  Surgeon: Jerene Bears, MD;  Location: [facility];  Service: Gastroenterology;  Laterality: N/A;  . EYE SURGERY Bilateral    cataract  . FEMORAL ARTERY REPAIR  04/09/2014   ANGIOSEAL  . INSERTION OF DIALYSIS CATHETER  08/01/2011  Procedure: INSERTION OF DIALYSIS CATHETER;  Surgeon: Rosetta Posner, MD;  Location: [place];  Service: Vascular;  Laterality: Right;  insertion of dialysis catheter on right internal jugular vein  . INSERTION OF DIALYSIS CATHETER Right 01/20/2015   Procedure: INSERTION OF DIALYSIS CATHETER - RIGHT INTERNAL JUGULAR ;  Surgeon: Angelia Mould, MD;  Location: [place];  Service: Vascular;  Laterality: Right;  . KIDNEY TRANSPLANT  04/25/1993  . LEFT HEART CATHETERIZATION WITH CORONARY ANGIOGRAM N/A 03/19/2014   Procedure: LEFT HEART CATHETERIZATION WITH CORONARY ANGIOGRAM;  Surgeon: Burnell Blanks, MD;  Location: [facility] CATH LAB;  Service: Cardiovascular;  Laterality: N/A;  . LYMPH NODE BIOPSY  08/10/2011   Procedure: LYMPH NODE BIOPSY;  Surgeon: Merrie Roof, MD;  Location: [place];  Service: General;  Laterality: Left;  left groin lymph biopsy  . NEUROPLASTY / TRANSPOSITION ULNAR NERVE AT ELBOW Left after 2005  . PATCH ANGIOPLASTY Left 08/09/2016   Procedure: LEFT  ARTERIOVENOUS FISTULA PATCH ANGIOPLASTY;  Surgeon: Waynetta Sandy, MD;   Location: [place];  Service: Vascular;  Laterality: Left;  . PERCUTANEOUS CORONARY ROTOBLATOR INTERVENTION (PCI-R) N/A 04/09/2014   Procedure: PERCUTANEOUS CORONARY ROTOBLATOR INTERVENTION (PCI-R);  Surgeon: Burnell Blanks, MD;  Location[facility] CATH LAB;  Service: Cardiovascular;  Laterality: N/A;  . PERIPHERAL VASCULAR BALLOON ANGIOPLASTY  07/11/2017   Procedure: PERIPHERAL VASCULAR BALLOON ANGIOPLASTY;  Surgeon: Waynetta Sandy, MD;  Location: [place] [facility];  Service: Cardiovascular;;  left arm AV fistula  . PERIPHERAL VASCULAR CATHETERIZATION N/A 12/25/2014   Procedure: Upper Extremity Venography;  Surgeon: Conrad Pender, MD;  Location: [place] [facility];  Service: Cardiovascular;  Laterality: N/A;  . PERIPHERAL VASCULAR CATHETERIZATION Left 02/24/2016   Procedure: Fistulagram;  Surgeon: Waynetta Sandy, MD;  Location: [place] [facility];  Service: Cardiovascular;  Laterality: Left;  . PERIPHERAL VASCULAR CATHETERIZATION Left 02/24/2016   Procedure: Peripheral Vascular Intervention;  Surgeon: Waynetta Sandy, MD;  Location: [place] [facility];  Service: Cardiovascular;  Laterality: Left;  AV FISTULA  . PERITONEAL CATHETER INSERTION    . PERITONEAL CATHETER REMOVAL    . REVISON OF ARTERIOVENOUS FISTULA Right 10/15/6331   Procedure: PLICATION / REVISION OF ARTERIOVENOUS FISTULA;  Surgeon: Angelia Mould, MD;  Location: [place];  Service: Vascular;  Laterality: Right;  . REVISON OF ARTERIOVENOUS FISTULA Left 09/01/2015   Procedure: SUPERFICIALIZATION OF LEFT ARM BRACHIOCEPHALIC ARTERIOVENOUS FISTULA;  Surgeon: Angelia Mould, MD;  Location: [place];  Service: Vascular;  Laterality: Left;  . REVISON OF ARTERIOVENOUS FISTULA Left 08/09/2016   Procedure: REVISION OF ARTERIAL ANASTOMOSIS OF ARTERIOVENOUS FISTULA;  Surgeon: Waynetta Sandy, MD;  Location: [place];  Service: Vascular;  Laterality: Left;  . SHOULDER ARTHROSCOPY Left ~ 2006   frozen  . TOE  AMPUTATION Bilateral after 2005  . UMBILICAL HERNIA REPAIR  1990's  . VENOGRAM Right 07/28/2011   Procedure: VENOGRAM;  Surgeon: Conrad Hanson, MD;  Location: [facility] CATH LAB;  Service: Cardiovascular;  Laterality: Right;  . VITRECTOMY Bilateral     Prior to Admission medications   Medication Sig Start Date End Date Taking? Authorizing Provider  acetaminophen (TYLENOL) 500 MG tablet Take 1,000 mg by mouth every 4 (four) hours as needed for moderate pain or headache.  01/16/16  Yes [provider]  allopurinol (ZYLOPRIM) 100 MG tablet TAKE 2 TABLETS BY MOUTH EVERY DAY Patient taking differently: TAKE 200 MG BY MOUTH EVERY DAY 03/30/17  Yes Mercy Riding, MD  aspirin EC 81 MG tablet Take 81 mg by mouth at bedtime.   Yes [provider]  atorvastatin (LIPITOR) 40 MG tablet TAKE 1 TABLET(40 MG) BY MOUTH DAILY 12/20/16  Yes Mercy Riding, MD  calcium acetate (PHOSLO) 667 MG capsule Take 667-1,334 mg by mouth See admin instructions. Take 1355ms with meals and 6630m with snacks 09/19/12  Yes [provider]  cetirizine (ZYRTEC) 10 MG tablet Take 10 mg by mouth daily.   Yes [provider]  docusate sodium (COLACE) 100 MG capsule Take 200 mg by mouth 2 (two) times daily.    Yes [provider]  fenofibrate (TRICOR) 48 MG tablet Take 48 mg by mouth daily.    Yes [provider]  fluticasone (FLONASE) 50 MCG/ACT nasal spray Place 2 sprays into both nostrils daily as needed for allergies.    Yes [provider]  FOSRENOL 1000 MG PACK Take 1,000 mg by mouth 3 (three) times daily.  09/14/16  Yes [provider]  gabapentin (NEURONTIN) 300 MG capsule Take 300 mg by mouth 3 (three) times daily.   Yes [provider]  GLUCAGON EMERGENCY 1 MG injection Inject 1 mg into the muscle daily as needed (low blood sugar).  01/19/12  Yes [provider]  HYDROmorphone (DILAUDID) 2 MG tablet Take 2 mg by mouth 2 (two) times daily.    Yes  [provider]  hydrOXYzine (ATARAX/VISTARIL) 10 MG tablet Take 10 mg by mouth every 8 (eight) hours as needed for anxiety or vomiting.    Yes [provider]  insulin lispro (HUMALOG) 100 UNIT/ML injection Inject 1-20 Units into the skin 3 (three) times daily. Blood glucose - 120, 1 unit for every 20 points over 120 ,  PT RECEIVES FROM INSULIN PUMP   Yes [provider]  levothyroxine (SYNTHROID, LEVOTHROID) 112 MCG tablet Take 112 mcg by mouth every evening. 2 hours after dinner   Yes [provider]  metoprolol tartrate (LOPRESSOR) 25 MG tablet Take 1 tablet (25 mg total) by mouth 2 (two) times daily. 03/29/17 07/12/17 Yes McBurnell BlanksMD  midodrine (PROAMATINE) 10 MG tablet Take 1 tablet (10 mg total) by mouth 3 (three) times daily. Takes 1 tab in mornings of dialysis and takes 2 tabs 30 mins before starting dialysis (m,w,f) 04/14/16  Yes Elgergawy, DaSilver HugueninMD  multivitamin (RENA-VIT) TABS tablet Take 1 tablet by mouth daily.   Yes [provider]  mupirocin ointment (BACTROBAN) 2 % Place 1 application into the nose 2 (two) times daily. 10/16/16  Yes Elgergawy, DaSilver HugueninMD  nitroGLYCERIN (NITROSTAT) 0.4 MG SL tablet Place 1 tablet (0.4 mg total) under the tongue every 5 (five) minutes as needed for chest pain. 02/15/16  Yes McBurnell BlanksMD  Nutritional Supplements (FEEDING SUPPLEMENT, NEPRO CARB STEADY,) LIQD Take 237 mLs by mouth daily. 01/04/16  Yes XuFlorencia ReasonsMD  omeprazole (PRILOSEC OTC) 20 MG tablet Take 20 mg by mouth daily.   Yes [provider]  OXYGEN Inhale 3 L into the lungs continuous.    Yes [provider]  Polyethyl Glycol-Propyl Glycol (SYSTANE) 0.4-0.3 % SOLN Place 1 drop into both eyes as needed (dry eyes).   Yes [provider]  polyethylene glycol (MIRALAX / GLYCOLAX) packet Take 17 g by mouth daily. Patient taking differently: Take 17 g by mouth daily as needed for mild constipation.   02/06/16  Yes Regalado, Belkys A, MD  prasugrel (EFFIENT) 10 MG TABS tablet TAKE  ONE TABLET BY MOUTH EVERY DAY Patient taking differently: TAKE 10 MG BY MOUTH EVERY DAY 03/09/17  Yes Eileen Stanford, PA-C  promethazine (PHENERGAN) 25 MG tablet Take 1 tablet (25 mg total) by mouth every 6 (six) hours as needed for nausea or vomiting. For nausea 11/27/14  Yes Weaver, Scott T, PA-C  rOPINIRole (REQUIP) 2 MG tablet TAKE 1 TABLET(2 MG) BY MOUTH EVERY NIGHT 05/05/17  Yes Mercy Riding, MD  testosterone cypionate (DEPOTESTOSTERONE CYPIONATE) 200 MG/ML injection INJECT 200 MG INTO THE MUSCLE EVERY 14 DAYS 09/10/16  Yes [provider]  VELPHORO 500 MG chewable tablet  11/15/16  Yes [provider]  Wound Cleansers (WOUND [place]) LIQD Apply 1 application topically daily as needed (wound care).   Yes [provider]    Current Facility-Administered Medications  Medication Dose Route Frequency Provider Last Rate Last Dose  . [START ON 07/17/2017] allopurinol (ZYLOPRIM) tablet 200 mg  200 mg Oral Q M,W,F-HD Shirley, [place], DO      . aspirin EC tablet 81 mg  81 mg Oral QHS Shirley, [place], DO   81 mg at 07/14/17 2126  . atorvastatin (LIPITOR) tablet 40 mg  40 mg Oral q1800 Shirley, [place], DO   40 mg at 07/14/17 1740  . calcium acetate (PHOSLO) capsule 667 mg  667 mg Oral TID WC Alric Seton, PA-C   667 mg at 07/14/17 1741  . [START ON 07/19/2017] Darbepoetin Alfa (ARANESP) injection 40 mcg  40 mcg Intravenous Q Wed-HD Alric Seton, PA-C      . diphenhydrAMINE (BENADRYL) capsule 25 mg  25 mg Oral Q6H PRN Shirley, [place], DO      . docusate sodium (COLACE) capsule 200 mg  200 mg Oral BID PRN Shirley, [place], DO      . doxercalciferol (HECTOROL) injection 3 mcg  3 mcg Intravenous Q M,W,F-HD Alric Seton, PA-C   3 mcg at 07/14/17 0630  . fenofibrate tablet 54 mg  54 mg Oral Daily Enid Derry, [place], DO   54 mg at 07/14/17 1335  . gabapentin (NEURONTIN) capsule 200 mg  200 mg Oral  Daily Enid Derry, [place], DO      . gabapentin (NEURONTIN) capsule 200 mg  200 mg Oral Q M,W,F-1800 Enid Derry, [place], DO   200 mg at 07/14/17 1740  . heparin injection 5,000 Units  5,000 Units Subcutaneous Q8H Leeanne Rio, MD   5,000 Units at 07/15/17 [phone]  . HYDROmorphone (DILAUDID) tablet 2 mg  2 mg Oral Q8H PRN Shirley, [place], DO      . insulin pump   Subcutaneous Continuous Enid Derry, [place], DO      . levothyroxine (SYNTHROID, LEVOTHROID) tablet 112 mcg  112 mcg Oral Q24H Shirley, [place], DO   112 mcg at 07/14/17 2126  . midodrine (PROAMATINE) tablet 10 mg  10 mg Oral Q M,W,F-HD Alric Seton, PA-C   10 mg at 07/14/17 0746  . midodrine (PROAMATINE) tablet 10 mg  10 mg Oral BID PRN Alric Seton, PA-C      . multivitamin (RENA-VIT) tablet 1 tablet  1 tablet Oral QHS Alric Seton, PA-C   1 tablet at 07/14/17 2126  . pantoprazole (PROTONIX) EC tablet 40 mg  40 mg Oral Daily Enid Derry, [place], DO   40 mg at 07/14/17 1335  . piperacillin-tazobactam (ZOSYN) IVPB 3.375 g  3.375 g Intravenous Q12H Leeanne Rio, MD   Stopped at 07/15/17 0128  . polyethylene glycol (MIRALAX / GLYCOLAX) packet 17 g  17 g Oral Daily PRN Enid Derry,  [place], DO      . polyvinyl alcohol (LIQUIFILM TEARS) 1.4 % ophthalmic solution 1 drop  1 drop Both Eyes PRN Leeanne Rio, MD      . prasugrel (EFFIENT) tablet 10 mg  10 mg Oral Daily Enid Derry, [place], DO   10 mg at 07/14/17 1334  . promethazine (PHENERGAN) injection 12.5 mg  12.5 mg Intravenous Q6H PRN Shirley, [place], DO   12.5 mg at 07/15/17 0332  . rOPINIRole (REQUIP) tablet 2 mg  2 mg Oral QHS Enid Derry, [place], DO   2 mg at 07/14/17 2127    Allergies as of 07/12/2017 - Review Complete 07/12/2017  Allergen Reaction Noted  . Iodinated diagnostic agents Itching, Rash, and Other (See Comments) 11/04/2013  . Prednisone Other (See Comments) 01/12/2016  . Adhesive [tape] Itching and Other (See Comments) 03/01/2015  . Amoxicillin Itching, Rash, and Other  (See Comments) 03/04/2011  . Clindamycin/lincomycin Hives and Itching 03/04/2011  . Enalapril Rash and Cough 08/27/2015  . Mobic [meloxicam] Hives and Rash 08/08/2015  . Morphine Hives, Nausea Only, and Rash 08/27/2015  . Brilinta [ticagrelor] Nausea And Vomiting 02/23/2016  . Ciprofloxacin Itching and Rash 03/04/2011  . Clopidogrel Rash 08/27/2015  . Codeine Hives, Itching, and Rash 07/22/2011  . Doxycycline Rash 02/18/2014  . Hydrocodone Rash 02/18/2014  . Levofloxacin Hives and Itching 07/22/2011  . Oxycodone Rash 11/21/2011  . Rocephin [ceftriaxone] Itching 09/24/2014    Family History  Problem Relation Age of Onset  . Colon polyps Father   . Diabetes Father   . Hypertension Father   . Hypertension Mother   . Diabetes Mother   . Hyperlipidemia Mother   . Breast cancer Maternal Aunt   . Bone cancer Maternal Aunt   . Liver cancer Maternal Aunt   . Malignant hyperthermia Neg Hx   . Heart attack Neg Hx   . Stroke Neg Hx   . Colon cancer Neg Hx   . Stomach cancer Neg Hx   . Pancreatic cancer Neg Hx   . Esophageal cancer Neg Hx     Social History   Socioeconomic History  . Marital status: Married    Spouse name: Not on file  . Number of children: Not on file  . Years of education: Not on file  . Highest education level: Not on file  Occupational History  . Occupation: Disabled  Social Needs  . Financial resource strain: Not on file  . Food insecurity:    Worry: Not on file    Inability: Not on file  . Transportation needs:    Medical: Not on file    Non-medical: Not on file  Tobacco Use  . Smoking status: Never Smoker  . Smokeless tobacco: Never Used  Substance and Sexual Activity  . Alcohol use: Yes    Comment: occasional  . Drug use: No  . Sexual activity: Not on file  Lifestyle  . Physical activity:    Days per week: Not on file    Minutes per session: Not on file  . Stress: Not on file  Relationships  . Social connections:    Talks on phone: Not  on file    Gets together: Not on file    Attends religious service: Not on file    Active member of club or organization: Not on file    Attends meetings of clubs or organizations: Not on file    Relationship status: Not on file  . Intimate partner violence:    Fear of current  or ex partner: Not on file    Emotionally abused: Not on file    Physically abused: Not on file    Forced sexual activity: Not on file  Other Topics Concern  . Not on file  Social History Narrative   Lives @ home with his wife in [place].     Review of Systems: ROS is O/W negative except as mentioned in HPI.  Physical Exam: Vital signs in last 24 hours: Temp:  [98.1 F (36.7 C)-98.4 F (36.9 C)] 98.3 F (36.8 C) (03/23 0454) Pulse Rate:  [67-87] 87 (03/23 0454) Resp:  [16-21] 16 (03/23 0454) BP: (106-147)/(26-52) 139/47 (03/23 0454) SpO2:  [99 %-100 %] 100 % (03/23 0454) Weight:  [281 lb 8.4 oz (127.7 kg)-285 lb 4.4 oz (129.4 kg)] 285 lb 4.4 oz (129.4 kg) (03/23 0454) Last BM Date: 07/13/17 General:  Alert, chronically ill-appearing, pleasant and cooperative in NAD; appears older than stated age. Head:  Normocephalic and atraumatic. Eyes:  Sclera clear, no icterus.  Conjunctiva pink. Ears:  Normal auditory acuity. Mouth:  No deformity or lesions.   Lungs:  Clear throughout to auscultation.  No wheezes, crackles, or rhonchi. Heart:  Regular rate and rhythm; no murmurs, clicks, rubs, or gallops. Abdomen:  Soft, obese and protuberant.  BS present.  RUQ TTP. Rectal:  Deferred  Pulses:  Normal pulses noted. Extremities:  Left BKA.  RLE brawny edema with bandages on shin. Neurologic:  Alert and oriented x 4;  grossly normal neurologically. Skin:  Intact without significant lesions or rashes. Psych:  Alert and cooperative. Normal mood and affect.  Intake/Output from previous day: 03/22 0701 - 03/23 0700 In: 160 [P.O.:160] Out: 4250   Lab Results: Recent Labs    07/13/17 0455 07/14/17 0429  07/15/17 0446  WBC 9.6 8.3 7.4  HGB 9.6* 9.3* 9.8*  HCT 31.8*  30.7* 30.1* 31.0*  PLT 188 182 190   BMET Recent Labs    07/13/17 0455 07/14/17 0429 07/15/17 0446  NA 133* 133* 129*  K 4.5 4.4 4.0  CL 93* 92* 89*  CO2 _0 GLUCOSE 262* 210* 244*  BUN 34* 49* 26*  CREATININE 7.86* 9.94* 7.42*  CALCIUM 8.2* 8.5* 8.2*   LFT Recent Labs    07/15/17 0446  PROT 6.9  ALBUMIN 2.7*  AST 56*  ALT 71*  ALKPHOS 355*  BILITOT 2.2*   Studies/Results: US Abdomen Limited Ruq  Result Date: 07/13/2017 CLINICAL DATA:  Abdominal pain EXAM: ULTRASOUND ABDOMEN LIMITED RIGHT UPPER QUADRANT COMPARISON:  CT abdomen and pelvis 02/03/2016 FINDINGS: Gallbladder: Dependent nonshadowing echogenic focus within gallbladder question small dependent calculus, 10 mm diameter. Minimally prominent gallbladder wall thickness. No pericholecystic fluid or sonographic Murphy sign. Common bile duct: Diameter: Only a short segment 4 mm diameter visualized Liver: Incomplete intrahepatic assessment likely due to body habitus. No gross hepatic mass or nodularity. Hepatic echogenicity appears normal. Portal vein is patent on color Doppler imaging with normal direction of blood flow towards the liver. No RIGHT upper quadrant free fluid IMPRESSION: Single dependent gallstone 10 mm diameter within gallbladder with minimal gallbladder wall thickening though no definite sonographic Murphy sign or pericholecystic fluid are identified. Incomplete hepatic visualization likely due to body habitus. Electronically Signed   By: Lavonia Dana M.D.   On: 07/13/2017 18:13   IMPRESSION:  *52 year old male who presented with sepsis type picture, admitted for SIRS with hypotension and fever.  No definite source of infection identified at this point.  LFT's are  elevated with ALT and AST trending down, but ALP increasing.  Could say that this could have been some reactive nonspecific elevation in relation to his sepsis, but once again,  there is still no clear source of sepsis at this point.  He's also had increasing heartburn/indigestion with nausea and has RUQ TTP although was not complaining of pain prior to hospital evaluation.  ? If this is gallbladder related.  Does not look like his ultrasound was a great study. *MULTIPLE other medical problems  PLAN: *Further plans per Dr. Loletha Carrow.   Laban Emperor. Zehr  07/15/2017, 8:47 AM  Pager number [phone]  I have reviewed the entire case in detail with the above APP and discussed the plan in detail.  Therefore, I agree with the diagnoses recorded above. In addition,  I have personally interviewed and examined the patient and have personally reviewed any abdominal/pelvic CT scan images.  My additional thoughts are as follows:  The significance of these elevated LFTs in the setting of his sepsis-like picture is he was not describing abdominal pain at the time of admission, speaking against uncertain.  An acute biliary process like cholecystitis or choledocholithiasis.  His blood cultures are negative thus far, and infectious workup negative.  He no longer has a sepsis-like picture.  He has been describing some recent nausea and indigestion of unclear cause.  His elevated LFTs are nonspecific with a mild transaminitis that is now improved, but a worsening cholestatic picture with rising alkaline phosphatase and stable mild elevation of bilirubin.  While some of his alkaline phosphatase elevation could be from chronic kidney disease, there is clearly a hepatobiliary cause since his GGT is elevated as well. His ultrasound a limited study due to his obesity, it is not clear if there was a gallstone on there.  It does not seem like they were able to evaluate the bile duct very well.  I wonder if he could have had some mild ischemic hepatopathy from the sepsis and hypotension.  We typically see a high degree of transaminitis with that, but it does cause him more delayed cholestatic-looking  picture. Given that there is cholestasis, a HIDA scan might not be helpful if the tracer does not get excreted from the liver well.  I also did his body habitus and his dyspnea breathing pattern might make for a poor quality MRCP.  At this point, I will check for viral hepatitis since he is a dialysis patient and see what his LFTs look like tomorrow.  If his cholestatic picture appears to be worsening, we will most likely have to make an attempt at visualizing the biliary tree with an MR study.   Nelida Meuse III Pager [phone]  Mon-Fri 8a-5p [phone] after 5p, weekends, holidays

## 2017-07-15 NOTE — Progress Notes (Signed)
Family Medicine Teaching Service Daily Progress Note Intern Pager: [phone]  Patient name: Dale [name] Medical record number: [id number] Date of birth: [birth year] Age: 52 y.o. Gender: male  Primary Care Provider: Mercy Riding, MD Consultants: Nephrology Code Status: Full  Pt Overview and Major Events to Date:  Dale [name] is a 52 y.o. male presenting with fever and hypotension . PMH is significant for ESRD on HD, Chronic diastolic CHF, CAD, Insulin-dependent T1DM, s/p L BKA, Hypothyroidism, Chronic pain, Gout, GERD, and HLD.  Assessment and Plan:  SIRS with uncertin source - may be a GI source given rising Alk phos at 355, elevated GTT at 264. Denying abdominal pain, only has nausea and reflux symptoms. Additionally s/p fistulagram on 3/19, BCx neg x 2d and fistula appears fine per vascular. Leukocytosis resolved. CXR negative.  - Vanc (3/20-), Zosyn (3/21-) and aztreonam (3/20-31 ) per pharmacy for at least 48 hours of neg BCx - getting IVF with abx - Monitor I's/O's - Blood cultures with NGx1d  Transaminitis  - patient w rising Alk phos at 355, elevated GTT at 264. No abd pain.Febrile on admission, which resolved with antibiotics, and leukocytosis resolved. One gallstone seen on RUQ ultrasound, no pericholecystic fluid or sonographic murphy sign, minimally prominent GB wall thickness. Incomplete hepatic visualization. May need ERCP vs MRCP? GI was consulted.  - GI consulted, appreciate recs  ESRD on HD, MWF:s/p kidney transplant in 1995.  - Nephrology consulted and is following, will keep HD schedule -Pt is anuric and s/p fluid boluses -Follow daily wts and I's/O's - Patient in HD MWF   GERD: on Prilosec 23m BID per patient as well as Reglan starting 3 weeks ago. Complaining of nausea this AM and foul taste in his mouth consistent with normal reflux symptoms. - Protonix 434mdaily>>> transition to BID today, titrate down when symptoms have improved - phenergan 12.5 mg  q4H PRN  Insulin-dependent T1DM: Insulin pump- Omnipod- changes every 3 days -A1c 5.7% 02/25/2016 -Continue insulin pump -Check CBG's every 4 hours  Chronic HFpEF: ECHO (11/01/16) with EF 60-65%, moderate LVH, grade1DD, elevated LV filling pressure, trivial MR, mild TR, RVSP 39 mmHg, normal IVC.On metoprolol 2526mID  -Follow daily wts and I/O -Holding beta-blocker d/t hypotension  CAD: hx of stents, No anginal complaints -EKG without acute changes -Troponin undetectable -Continue Lipitor, Effient, and ASA 81  Anemia: Hgb on admission 10.3> 9.3- likely dilutional, baseline appears to be 11.5-12.5 - Monitor on daily CBC  - Folate Pending, Vit B12 430  S/p L BKA: Chronic, stable. - Up with assistance  Hypothyroidism: On synthroid 112m8mt home -TSH normal -Continue Synthroid  Chronic pain: Followed by rheumatology per patient. Attributed to low back degenerative disease. Continue home-regimen as tolerated. - increased to Dilaudid 2mg 43m prn while in hospital - gabapentin 300mg 26m(pt reports taking it BID)-we will renally dose to gabapentin 200 mg/day with patient getting an extra 200 mg after HD sessions. - Bowel regimen to prevent constipation  Gout: On allopurinol 200mg d29m  - Will renally dose to 200 mg on days with HD  HLD: - fenofibrate and atorvastatin  Chronic Venous Stasis of RLE: - Wound care consulted  FEN/GI: Renal/carb modified diet, PPI Prophylaxis: Heparin per pharmacy  Disposition: continued inpatient stay  Subjective:  Sitting up in bed, complaining of nausea. Appropriate, conversant. Denies abdominal pain. No acute events overnight.  Objective: Temp:  [98.1 F (36.7 C)-98.4 F (36.9 C)] 98.3 F (36.8 C) (03/23 0454) Pulse  Rate:  [67-87] 87 (03/23 0454) Resp:  [16-21] 16 (03/23 0454) BP: (106-139)/(26-52) 139/47 (03/23 0454) SpO2:  [99 %-100 %] 100 % (03/23 0454) Weight:  [281 lb 8.4 oz (127.7 kg)-285 lb 4.4 oz  (129.4 kg)] 285 lb 4.4 oz (129.4 kg) (03/23 0454) Physical Exam: General: NAD Cardiovascular: RRR, no m/r/g, no LE edema Respiratory: CTA BL, comfortable work of breathing, distant breath sounds due to body habitus Gastrointestinal: obese, soft, nt, neg murphy sign MSK: s/p L BKA Derm: no rashes appreciated   Laboratory: Recent Labs  Lab 07/13/17 0455 07/14/17 0429 07/15/17 0446  WBC 9.6 8.3 7.4  HGB 9.6* 9.3* 9.8*  HCT 31.8*  30.7* 30.1* 31.0*  PLT 188 182 190   Recent Labs  Lab 07/13/17 0455 07/14/17 0429 07/15/17 0446  NA 133* 133* 129*  K 4.5 4.4 4.0  CL 93* 92* 89*  CO2 '25 26 22  ' BUN 34* 49* 26*  CREATININE 7.86* 9.94* 7.42*  CALCIUM 8.2* 8.5* 8.2*  PROT 6.7 7.0 6.9  BILITOT 3.5* 2.2* 2.2*  ALKPHOS 167* 235* 355*  ALT 102* 78* 71*  AST 115* 71* 56*  GLUCOSE 262* 210* 244*   LA 1.87>1.09 Influenza negative Vitamin B12 430 TSH 1.517 MRSA positive  Imaging/Diagnostic Tests: Dg Chest 2 View  Result Date: 07/12/2017 CLINICAL DATA:  Dialysis patient.  Lethargy and fever. EXAM: CHEST - 2 VIEW COMPARISON:  10/14/2016 FINDINGS: Mild cardiac enlargement which is chronic. Pulmonary venous hypertension without edema. No infiltrate, collapse or effusion. No significant bone finding. IMPRESSION: Cardiomegaly and pulmonary venous hypertension.  Otherwise negative. Electronically Signed   By: Nelson Chimes M.D.   On: 07/12/2017 12:31    Everrett Coombe, MD 07/15/2017, 11:42 AM PGY-2, [facility] Intern pager: [phone], text pages welcome

## 2017-07-15 NOTE — Progress Notes (Addendum)
[facility] Progress Note   Subjective:  Seen in room. No CP or dyspnea today. GI seeing him today for ^ LFTs.  Objective Vitals:   07/14/17 1250 07/14/17 1400 07/14/17 2121 07/15/17 0454  BP: (!) 113/43 (!) 106/26 (!) 124/52 (!) 139/47  Pulse: 84 67 86 87  Resp: (!) 21  16 16   Temp: 98.1 F (36.7 C)  98.4 F (36.9 C) 98.3 F (36.8 C)  TempSrc: Oral  Oral Oral  SpO2: 99% 100% 99% 100%  Weight: 127.7 kg (281 lb 8.4 oz)   129.4 kg (285 lb 4.4 oz)  Height:       Physical Exam General: Obese man, NAD. Wearing nasal oxygen. Heart: RRR; no murmur Lungs: CTAB Abdomen: mild tenderness in RUQ, no guarding Extremities: No LE edema Dialysis Access: AVF + bruit  Additional Objective Labs: Basic Metabolic Panel: Recent Labs  Lab 07/13/17 0455 07/14/17 0429 07/15/17 0446  NA 133* 133* 129*  K 4.5 4.4 4.0  CL 93* 92* 89*  CO2 25 26 22   GLUCOSE 262* 210* 244*  BUN 34* 49* 26*  CREATININE 7.86* 9.94* 7.42*  CALCIUM 8.2* 8.5* 8.2*   Liver Function Tests: Recent Labs  Lab 07/13/17 0455 07/14/17 0429 07/15/17 0446  AST 115* 71* 56*  ALT 102* 78* 71*  ALKPHOS 167* 235* 355*  BILITOT 3.5* 2.2* 2.2*  PROT 6.7 7.0 6.9  ALBUMIN 2.9* 2.8* 2.7*   Recent Labs  Lab 07/14/17 1334  LIPASE 47   CBC: Recent Labs  Lab 07/12/17 1149 07/13/17 0455 07/14/17 0429 07/15/17 0446  WBC 15.7* 9.6 8.3 7.4  NEUTROABS 14.3*  --   --   --   HGB 10.3* 9.6* 9.3* 9.8*  HCT 33.2* 31.8*  30.7* 30.1* 31.0*  MCV 109.6* 112.4* 109.1* 107.6*  PLT 243 188 182 190   Blood Culture    Component Value Date/Time   SDES BLOOD BLOOD RIGHT WRIST 07/12/2017 1159   SPECREQUEST  07/12/2017 1159    BOTTLES DRAWN AEROBIC AND ANAEROBIC Blood Culture adequate volume   CULT  07/12/2017 1159    NO GROWTH 2 DAYS Performed at [facility], [place] [address], [place], [place] [address]    REPTSTATUS PENDING 07/12/2017 1159    Cardiac Enzymes: Recent Labs  Lab 07/12/17 1149   TROPONINI <0.03   CBG: Recent Labs  Lab 07/14/17 2117 07/15/17 0053 07/15/17 0331 07/15/17 0812 07/15/17 0953  GLUCAP 94 73 184* 218* 207*   Studies/Results: US Abdomen Limited Ruq  Result Date: 07/13/2017 CLINICAL DATA:  Abdominal pain EXAM: ULTRASOUND ABDOMEN LIMITED RIGHT UPPER QUADRANT COMPARISON:  CT abdomen and pelvis 02/03/2016 FINDINGS: Gallbladder: Dependent nonshadowing echogenic focus within gallbladder question small dependent calculus, 10 mm diameter. Minimally prominent gallbladder wall thickness. No pericholecystic fluid or sonographic Murphy sign. Common bile duct: Diameter: Only a short segment 4 mm diameter visualized Liver: Incomplete intrahepatic assessment likely due to body habitus. No gross hepatic mass or nodularity. Hepatic echogenicity appears normal. Portal vein is patent on color Doppler imaging with normal direction of blood flow towards the liver. No RIGHT upper quadrant free fluid IMPRESSION: Single dependent gallstone 10 mm diameter within gallbladder with minimal gallbladder wall thickening though no definite sonographic Murphy sign or pericholecystic fluid are identified. Incomplete hepatic visualization likely due to body habitus. Electronically Signed   By: Lavonia Dana M.D.   On: 07/13/2017 18:13   Medications: . insulin pump    . piperacillin-tazobactam (ZOSYN)  IV Stopped (07/15/17 0128)   . [  START ON 07/17/2017] allopurinol  200 mg Oral Q M,W,F-HD  . aspirin EC  81 mg Oral QHS  . atorvastatin  40 mg Oral q1800  . calcium acetate  667 mg Oral TID WC  . [START ON 07/19/2017] darbepoetin (ARANESP) injection - DIALYSIS  40 mcg Intravenous Q Wed-HD  . doxercalciferol  3 mcg Intravenous Q M,W,F-HD  . fenofibrate  54 mg Oral Daily  . gabapentin  200 mg Oral Daily  . gabapentin  200 mg Oral Q M,W,F-1800  . heparin injection (subcutaneous)  5,000 Units Subcutaneous Q8H  . levothyroxine  112 mcg Oral Q24H  . midodrine  10 mg Oral Q M,W,F-HD  .  multivitamin  1 tablet Oral QHS  . pantoprazole  40 mg Oral Daily  . prasugrel  10 mg Oral Daily  . rOPINIRole  2 mg Oral QHS    Dialysis Orders: [facility] MWF 4h 86min  LUA AVF   129kg  450/800  2/2.25 bath  P2  Hep 5000 -hectorol 3  -mircera 30 q 4 weeks last 3/6  Assessment/Plan: 1. Sepsis/ fevers/ hypotension: WBC back down, BC no growth, influenza neg, CXR neg infiltrate. Was on Vanc/Aztreonam -> Vanc/Zosyn (although Vanc on hold for ^ trough level). 2. ESRD: Continue HD per MWF schedule, next 3/25. 3. AVF dysfunction: S/p PTA  80% stenosis near artrial anastomosis 5/78, patent cephalic arch stent; improved. 4. BP/volume: BP ok, on midodrine pre-HD. 5. Anemia: Hgb 9.8, on Aranesp 97mcg q Wed. 6. Metabolic bone disease: Ca ok, Phos pending. Continue Phoslo and Hectoral. 7. Nutrition: Alb 2.7, adding pro-stat 8. DM -per primary- insulin pump 9. Chronic pain-meds per primary 10. GERD-PPI 11. HLD - on fenofibrate/statin 12. ^ LFT - still elevated- no recent outpt labs to compare- ? Hold statin 13. Hypothyroidism - on synthroid 14. MRSA +contact isolation- need to continue this after d/c 15. Contrast allergy- had mild-mod erythematous skin reaction inspite of premeds of steroid/benadryl. States this always happens 16. Nausea- change zofran to phenergan per pt request   Veneta Penton, PA-C 07/15/2017, 10:00 AM  [facility] Pager: [phone]  Pt seen, examined and agree w A/P as above.  Kelly Splinter MD [name] pager [phone]   07/15/2017, 10:57 AM

## 2017-07-16 ENCOUNTER — Inpatient Hospital Stay (HOSPITAL_COMMUNITY): Payer: Medicare Other

## 2017-07-16 ENCOUNTER — Other Ambulatory Visit: Payer: Self-pay

## 2017-07-16 LAB — COMPREHENSIVE METABOLIC PANEL
ALT: 63 U/L (ref 17–63)
AST: 50 U/L — ABNORMAL HIGH (ref 15–41)
Albumin: 2.7 g/dL — ABNORMAL LOW (ref 3.5–5.0)
Alkaline Phosphatase: 421 U/L — ABNORMAL HIGH (ref 38–126)
Anion gap: 16 — ABNORMAL HIGH (ref 5–15)
BUN: 41 mg/dL — ABNORMAL HIGH (ref 6–20)
CO2: 24 mmol/L (ref 22–32)
Calcium: 8.6 mg/dL — ABNORMAL LOW (ref 8.9–10.3)
Chloride: 92 mmol/L — ABNORMAL LOW (ref 101–111)
Creatinine, Ser: 9.85 mg/dL — ABNORMAL HIGH (ref 0.61–1.24)
GFR calc Af Amer: 6 mL/min — ABNORMAL LOW (ref >60)
GFR calc non Af Amer: 5 mL/min — ABNORMAL LOW (ref >60)
Glucose, Bld: 118 mg/dL — ABNORMAL HIGH (ref 65–99)
Potassium: 4.1 mmol/L (ref 3.5–5.1)
Sodium: 132 mmol/L — ABNORMAL LOW (ref 135–145)
Total Bilirubin: 1.5 mg/dL — ABNORMAL HIGH (ref 0.3–1.2)
Total Protein: 7.1 g/dL (ref 6.5–8.1)

## 2017-07-16 LAB — GLUCOSE, CAPILLARY
Glucose-Capillary: 106 mg/dL — ABNORMAL HIGH (ref 65–99)
Glucose-Capillary: 58 mg/dL — ABNORMAL LOW (ref 65–99)
Glucose-Capillary: 185 mg/dL — ABNORMAL HIGH (ref 65–99)
Glucose-Capillary: 124 mg/dL — ABNORMAL HIGH (ref 65–99)

## 2017-07-16 LAB — CBC
HCT: 33.1 % — ABNORMAL LOW (ref 39.0–52.0)
Hemoglobin: 10.6 g/dL — ABNORMAL LOW (ref 13.0–17.0)
MCH: 34 pg (ref 26.0–34.0)
MCHC: 32 g/dL (ref 30.0–36.0)
MCV: 106.1 fL — ABNORMAL HIGH (ref 78.0–100.0)
Platelets: 222 10*3/uL (ref 150–400)
RBC: 3.12 MIL/uL — ABNORMAL LOW (ref 4.22–5.81)
RDW: 14.3 % (ref 11.5–15.5)
WBC: 7 10*3/uL (ref 4.0–10.5)

## 2017-07-16 MED ORDER — DEXTROSE 50 % IV SOLN
INTRAVENOUS | Status: AC
  Administered 2017-07-16: 15:00:00
  Filled 2017-07-16: qty 50

## 2017-07-16 MED ORDER — TECHNETIUM TC 99M MEBROFENIN IV KIT: 4.4500 | PACK | Freq: Once | INTRAVENOUS | Status: DC | PRN

## 2017-07-16 MED ORDER — TECHNETIUM TC 99M MEBROFENIN IV KIT
5.4500 | PACK | Freq: Once | INTRAVENOUS | Status: AC | PRN
  Administered 2017-07-16: 5.45 via INTRAVENOUS

## 2017-07-16 MED ORDER — TECHNETIUM TC 99M MEBROFENIN IV KIT: 6.4500 | PACK | Freq: Once | INTRAVENOUS | Status: DC | PRN

## 2017-07-16 MED ORDER — HEPARIN SODIUM (PORCINE) 1000 UNIT/ML DIALYSIS: 20.0000 [IU]/kg | INTRAMUSCULAR | Status: DC | PRN

## 2017-07-16 NOTE — Progress Notes (Signed)
[facility] Progress Note   Subjective:  Seen in room. No sig c/o's.  For ^LFT's and RUQ TTP patient undergoing w/u per GI.  [place] didn't show the GB, today is getting HIDA scan.   Objective Vitals:   07/15/17 1333 07/15/17 2100 07/16/17 0500 07/16/17 1616  BP: (!) 119/46 (!) 142/59 (!) 99/39 (!) 143/64  Pulse: 85 88 84 83  Resp: 16 17 18 19   Temp: 98.3 F (36.8 C) 98 F (36.7 C) 98.3 F (36.8 C) (!) 97.5 F (36.4 C)  TempSrc: Oral Oral Oral Oral  SpO2: 100% 99% 98% 95%  Weight:      Height:       Physical Exam General: Obese man, NAD. Wearing nasal oxygen. Heart: RRR; no murmur Lungs: CTAB Abdomen: mild tenderness in RUQ, no guarding Extremities: No LE edema Dialysis Access: AVF + bruit  Additional Objective Labs: Basic Metabolic Panel: Recent Labs  Lab 07/14/17 0429 07/15/17 0446 07/16/17 0615  NA 133* 129* 132*  K 4.4 4.0 4.1  CL 92* 89* 92*  CO2 26 22 24   GLUCOSE 210* 244* 118*  BUN 49* 26* 41*  CREATININE 9.94* 7.42* 9.85*  CALCIUM 8.5* 8.2* 8.6*   Liver Function Tests: Recent Labs  Lab 07/14/17 0429 07/15/17 0446 07/16/17 0615  AST 71* 56* 50*  ALT 78* 71* 63  ALKPHOS 235* 355* 421*  BILITOT 2.2* 2.2* 1.5*  PROT 7.0 6.9 7.1  ALBUMIN 2.8* 2.7* 2.7*   Recent Labs  Lab 07/14/17 1334  LIPASE 47   CBC: Recent Labs  Lab 07/12/17 1149 07/13/17 0455 07/14/17 0429 07/15/17 0446 07/16/17 0615  WBC 15.7* 9.6 8.3 7.4 7.0  NEUTROABS 14.3*  --   --   --   --   HGB 10.3* 9.6* 9.3* 9.8* 10.6*  HCT 33.2* 31.8*  30.7* 30.1* 31.0* 33.1*  MCV 109.6* 112.4* 109.1* 107.6* 106.1*  PLT 243 188 182 190 222   Blood Culture    Component Value Date/Time   SDES BLOOD BLOOD RIGHT WRIST 07/12/2017 1159   SPECREQUEST  07/12/2017 1159    BOTTLES DRAWN AEROBIC AND ANAEROBIC Blood Culture adequate volume   CULT  07/12/2017 1159    NO GROWTH 4 DAYS Performed at [facility], [place] [address] [place], [place] [address]    REPTSTATUS  PENDING 07/12/2017 1159    Cardiac Enzymes: Recent Labs  Lab 07/12/17 1149  TROPONINI <0.03   CBG: Recent Labs  Lab 07/15/17 1341 07/15/17 1823 07/15/17 2201 07/16/17 0817 07/16/17 1433  GLUCAP 137* 83 150* 106* 58*   Studies/Results: Nm Hepatobiliary Liver Func  Result Date: 07/16/2017 CLINICAL DATA:  Nausea, vomiting, RIGHT upper quadrant pain EXAM: NUCLEAR MEDICINE HEPATOBILIARY IMAGING TECHNIQUE: Sequential images of the abdomen were obtained out to 60 minutes following intravenous administration of radiopharmaceutical. RADIOPHARMACEUTICALS:  5.45 mCi Tc-64m  Choletec IV COMPARISON:  None FINDINGS: Adequate tracer extraction from bloodstream. Concentration and excretion of tracer into small bowel by 10 minutes. At 60 minutes significant hepatic retention of tracer is seen. This persisted through an additional 30 minutes of imaging. Due to medication allergy, patient could not received morphine. Initial imaging showed a subtle area of questionable tracer accumulation within the gallbladder beginning at approximately 30 minutes. Delayed image at 4 hours demonstrated decrease in liver background with only mild tracer within the gallbladder. The markedly diminished degree of tracer localization within the gallbladder precludes adequate statistical analysis for assessment of gallbladder function following fatty meal stimulation, which was therefore not  performed. IMPRESSION: Faint visualization of the gallbladder during the 4 hours of imaging indicating patency of the cystic duct. Patent CBD. Prolonged hepatic retention of tracer consistent with hepatocellular dysfunction. Electronically Signed   By: Lavonia Dana M.D.   On: 07/16/2017 15:33   Medications: . insulin pump    . piperacillin-tazobactam (ZOSYN)  IV 3.375 g (07/16/17 1616)   . [START ON 07/17/2017] allopurinol  200 mg Oral Q M,W,F-HD  . aspirin EC  81 mg Oral QHS  . calcium acetate  667 mg Oral TID WC  . [START ON 07/19/2017]  darbepoetin (ARANESP) injection - DIALYSIS  40 mcg Intravenous Q Wed-HD  . doxercalciferol  3 mcg Intravenous Q M,W,F-HD  . feeding supplement (PRO-STAT SUGAR FREE 64)  30 mL Oral BID  . gabapentin  200 mg Oral Daily  . gabapentin  200 mg Oral Q M,W,F-1800  . heparin injection (subcutaneous)  5,000 Units Subcutaneous Q8H  . levothyroxine  112 mcg Oral Q24H  . midodrine  10 mg Oral Q M,W,F-HD  . multivitamin  1 tablet Oral QHS  . pantoprazole  40 mg Oral BID  . prasugrel  10 mg Oral Daily  . rOPINIRole  2 mg Oral QHS    Dialysis Orders: [facility] MWF 4h 65min  LUA AVF   129kg  450/800  2/2.25 bath  P2  Hep 5000 -hectorol 3  -mircera 30 q 4 weeks last 3/6  Assessment/Plan: 1. SIRS/ fever: WBC back down, BC no growth, influenza neg, CXR neg infiltrate. Was on Vanc/Aztreonam -> now zosyn. Looks better, at baseline now. Is getting GI w/u for ^LFT"s and RUQ tenderness to palpation.  2. ^LFT's: per GI, for HIDA scan today 3. ESRD: Continue HD per MWF schedule, next 3/25. 4. AVF dysfunction: S/p PTA  80% stenosis near artrial anastomosis 0/27, patent cephalic arch stent; improved. 5. BP/volume: BP ok, on midodrine pre-HD. 6. Anemia: Hgb 9.8, on Aranesp 80mcg q Wed. 7. Metabolic bone disease: Ca ok, Phos pending. Continue Phoslo and Hectoral. 8. Nutrition: Alb 2.7, adding pro-stat 9. DM -per primary- insulin rx 10. Hypothyroidism - on synthroid 11. MRSA +contact isolation- need to continue this after d/c 12. Contrast allergy- had mild-mod erythematous skin reaction inspite of premeds of steroid/benadryl. States this always happens   Kelly Splinter MD [name] pager [phone]   07/16/2017, 5:39 PM

## 2017-07-16 NOTE — Progress Notes (Addendum)
     [facility] Progress Note  CC:  RUQ abdominal pain and elevated LFT's  Subjective:  Still having a lot of heartburn, nausea, vomiting.  Objective:  Vital signs in last 24 hours: Temp:  [98 F (36.7 C)-98.3 F (36.8 C)] 98.3 F (36.8 C) (03/24 0500) Pulse Rate:  [84-88] 84 (03/24 0500) Resp:  [16-18] 18 (03/24 0500) BP: (99-142)/(39-59) 99/39 (03/24 0500) SpO2:  [98 %-100 %] 98 % (03/24 0500) Last BM Date: 07/15/17 General:  Alert, chronically ill-appearing, in NAD Heart:  Regular rate and rhythm; no murmurs Pulm:  CTAB.  No increased Abdomen:  Soft, obese, protuberant.  BS present.  RUQ TTP. Extremities:  Left BKA.  RLE with dressing covering shin. Neurologic:  Alert and oriented x 4;  grossly normal neurologically. Psych:  Alert and cooperative. Normal mood and affect.  Intake/Output from previous day: 03/23 0701 - 03/24 0700 In: 150 [IV Piggyback:150] Out: -   Lab Results: Recent Labs    07/14/17 0429 07/15/17 0446 07/16/17 0615  WBC 8.3 7.4 7.0  HGB 9.3* 9.8* 10.6*  HCT 30.1* 31.0* 33.1*  PLT 182 190 222   BMET Recent Labs    07/14/17 0429 07/15/17 0446 07/16/17 0615  NA 133* 129* 132*  K 4.4 4.0 4.1  CL 92* 89* 92*  CO2 '26 22 24  '$ GLUCOSE 210* 244* 118*  BUN 49* 26* 41*  CREATININE 9.94* 7.42* 9.85*  CALCIUM 8.5* 8.2* 8.6*   LFT Recent Labs    07/16/17 0615  PROT 7.1  ALBUMIN 2.7*  AST 50*  ALT 63  ALKPHOS 421*  BILITOT 1.5*   Assessment / Plan: *52 year old male who presented with sepsis type picture, admitted for SIRS with hypotension and fever.  No definite source of infection identified at this point.  LFT's are elevated with ALT and AST trending down, but ALP increasing.  Could say that this could have been some reactive nonspecific elevation in relation to his sepsis, but once again, there is still no clear source of sepsis at this point.  He's also had increasing heartburn/indigestion with nausea and has RUQ TTP  although was not complaining of pain prior to hospital evaluation.  ? If this is gallbladder related.  Does not look like his ultrasound was a great study. *MULTIPLE other medical problems  **HIDA scan today.   LOS: 4 days   Laban Emperor. Zehr  07/16/2017, 9:11 AM  Pager number [phone]   I have discussed the case with the PA, and that is the plan I formulated. I personally interviewed and examined the patient.  His symptoms are difficult to characterize. With rising alk phos, they may be biliary such as choledocholithiasis, less likely cholecystitis.  MRCP would be very challenging due to morbid obesity, dyspnea at rest and reported severe claustrophobia.  He has RUQ TTP today.  HIDA scan ordered. CMP in AM  Dr. Ardis Hughs to see tomorrow.   Nelida Meuse III Pager [phone]  Mon-Fri 8a-5p [phone] after 5p, weekends, holidays

## 2017-07-16 NOTE — Progress Notes (Signed)
Family Medicine Teaching Service Daily Progress Note Intern Pager: [phone]  Patient name: Dale [name] Medical record number: [id number] Date of birth: 1965/05/21 Age: 52 y.o. Gender: male  Primary Care Provider: Mercy Riding, MD Consultants: Nephrology Code Status: Full  Pt Overview and Major Events to Date:  Dale [name] is a 52 y.o. male presenting with fever and hypotension . PMH is significant for ESRD on HD, Chronic diastolic CHF, CAD, Insulin-dependent T1DM, s/p L BKA, Hypothyroidism, Chronic pain, Gout, GERD, and HLD.  Assessment and Plan:  SIRS with uncertin source - may be a GI source given rising Alk phos at 355, elevated GTT at 264. Denying abdominal pain, only has nausea and reflux symptoms. Additionally s/p fistulagram on 3/19, BCx neg x 3d and fistula appears fine per vascular. Leukocytosis resolved.  - Continue Zosyn (3/21-) pending biliary work up- am CMP pending - s/p Vanc (3/20-23) and aztreonam (3/20-21) per pharmacy  - getting IVF with abx - Monitor I's/O's - Blood cultures with NGx3d  Transaminitis  - patient w rising Alk phos at 355, elevated GTT at 264. No abd pain. Febrile on admission, which resolved with antibiotics, and leukocytosis resolved. One gallstone seen on RUQ ultrasound, no pericholecystic fluid or sonographic murphy sign, minimally prominent GB wall thickness. Incomplete hepatic visualization. May need ERCP vs MRCP?  - GI consulted, appreciate recs  ESRD on HD, MWF:s/p kidney transplant in 1995.  - Nephrology consulted and is following, will keep HD schedule -Pt is anuric and s/p fluid boluses -Follow daily wts and I's/O's - Patient in HD MWF   GERD: on Prilosec 48m BID per patient as well as Reglan starting 3 weeks ago. Complaining of nausea this AM and foul taste in his mouth consistent with normal reflux symptoms. - Protonix 421mdaily>>> transition to BID today, titrate down when symptoms have improved - phenergan 12.5 mg q4H  PRN  Insulin-dependent T1DM: Insulin pump- Omnipod- changes every 3 days -A1c 5.7% 02/25/2016 -Continue insulin pump -Check CBG's every 4 hours  Chronic HFpEF: ECHO (11/01/16) with EF 60-65%, moderate LVH, G1DD, elevated LV filling pressure, trivial MR, mild TR, RVSP 39 mmHg, normal IVC.On metoprolol 2547mID  -Follow daily wts and I/O -Holding beta-blocker d/t hypotension  CAD: hx of stents, No anginal complaints -EKG without acute changes -Troponin undetectable -Continue Lipitor, Effient, and ASA 81  Anemia: Hgb on admission 10.3> 9.3- likely dilutional, baseline appears to be 11.5-12.5 - Monitor on daily CBC  - Folate Pending, Vit B12 430  S/p L BKA: Chronic, stable. - Up with assistance  Hypothyroidism: On synthroid 112m62mt home -TSH normal -Continue Synthroid  Chronic pain: Followed by rheumatology per patient. Attributed to low back degenerative disease. Continue home-regimen as tolerated. - increased to Dilaudid 2mg 41m prn while in hospital - gabapentin 300mg 48m(pt reports taking it BID)-we will renally dose to gabapentin 200 mg/day with patient getting an extra 200 mg after HD sessions. - Bowel regimen to prevent constipation  Gout: On allopurinol 200mg d39m  - Will renally dose to 200 mg on days with HD  HLD: - fenofibrate and atorvastatin  Chronic Venous Stasis of RLE: - Wound care consulted  FEN/GI: Renal/carb modified diet, PPI Prophylaxis: Heparin per pharmacy  Disposition: continued inpatient stay  Subjective:  Patient complaining of nausea this morning as well as heartburn.  Patient reports that it is worse when he eats.  Patient also reporting that he has had diarrhea since admission but this has slowly improved.  Patient is to let us know if he continues to have diarrhea or begins worsening.  Objective: Temp:  [98 F (36.7 C)-98.3 F (36.8 C)] 98.3 F (36.8 C) (03/24 0500) Pulse Rate:  [84-88] 84 (03/24  0500) Resp:  [16-18] 18 (03/24 0500) BP: (99-142)/(39-59) 99/39 (03/24 0500) SpO2:  [98 %-100 %] 98 % (03/24 0500) Physical Exam: General: NAD, pleasant Eyes: PERRL, EOMI, no conjunctival pallor or injection ENTM: Moist mucous membranes Cardiovascular: RRR, no m/r/g, no LE edema Respiratory: CTA BL, normal work of breathing Gastrointestinal: mildly tender, nondistended, normoactive BS MSK: s/p L BKA Derm: no rashes appreciated Neuro: CN II-XII grossly intact Psych: AOx3, appropriate affect   Laboratory: Recent Labs  Lab 07/13/17 0455 07/14/17 0429 07/15/17 0446  WBC 9.6 8.3 7.4  HGB 9.6* 9.3* 9.8*  HCT 31.8*  30.7* 30.1* 31.0*  PLT 188 182 190   Recent Labs  Lab 07/13/17 0455 07/14/17 0429 07/15/17 0446  NA 133* 133* 129*  K 4.5 4.4 4.0  CL 93* 92* 89*  CO2 '25 26 22  ' BUN 34* 49* 26*  CREATININE 7.86* 9.94* 7.42*  CALCIUM 8.2* 8.5* 8.2*  PROT 6.7 7.0 6.9  BILITOT 3.5* 2.2* 2.2*  ALKPHOS 167* 235* 355*  ALT 102* 78* 71*  AST 115* 71* 56*  GLUCOSE 262* 210* 244*   LA 1.87>1.09 Influenza negative Vitamin B12 430 TSH 1.517 MRSA positive  Imaging/Diagnostic Tests: No results found.  Hazleigh Mccleave, [place], DO 07/16/2017, 7:15 AM PGY-1, [place] Intern pager: [phone], text pages welcome

## 2017-07-16 NOTE — Progress Notes (Signed)
CBG 150, patient gave himself 2 units insulin per insulin pump. P.J. Linus Mako, RN

## 2017-07-17 DIAGNOSIS — K8050 Calculus of bile duct without cholangitis or cholecystitis without obstruction: Secondary | ICD-10-CM

## 2017-07-17 DIAGNOSIS — R1010 Upper abdominal pain, unspecified: Secondary | ICD-10-CM

## 2017-07-17 LAB — COMPREHENSIVE METABOLIC PANEL
ALT: 64 U/L — ABNORMAL HIGH (ref 17–63)
AST: 63 U/L — ABNORMAL HIGH (ref 15–41)
Albumin: 2.5 g/dL — ABNORMAL LOW (ref 3.5–5.0)
Alkaline Phosphatase: 468 U/L — ABNORMAL HIGH (ref 38–126)
Anion gap: 15 (ref 5–15)
BUN: 57 mg/dL — ABNORMAL HIGH (ref 6–20)
CO2: 24 mmol/L (ref 22–32)
Calcium: 8.2 mg/dL — ABNORMAL LOW (ref 8.9–10.3)
Chloride: 90 mmol/L — ABNORMAL LOW (ref 101–111)
Creatinine, Ser: 12.18 mg/dL — ABNORMAL HIGH (ref 0.61–1.24)
GFR calc Af Amer: 5 mL/min — ABNORMAL LOW (ref >60)
GFR calc non Af Amer: 4 mL/min — ABNORMAL LOW (ref >60)
Glucose, Bld: 261 mg/dL — ABNORMAL HIGH (ref 65–99)
Potassium: 4.1 mmol/L (ref 3.5–5.1)
Sodium: 129 mmol/L — ABNORMAL LOW (ref 135–145)
Total Bilirubin: 1.4 mg/dL — ABNORMAL HIGH (ref 0.3–1.2)
Total Protein: 6.9 g/dL (ref 6.5–8.1)

## 2017-07-17 LAB — CULTURE, BLOOD (ROUTINE X 2)
Culture: NO GROWTH
Culture: NO GROWTH
Special Requests: ADEQUATE
Special Requests: ADEQUATE

## 2017-07-17 LAB — CBC
HCT: 29.5 % — ABNORMAL LOW (ref 39.0–52.0)
Hemoglobin: 9.6 g/dL — ABNORMAL LOW (ref 13.0–17.0)
MCH: 33.7 pg (ref 26.0–34.0)
MCHC: 32.5 g/dL (ref 30.0–36.0)
MCV: 103.5 fL — ABNORMAL HIGH (ref 78.0–100.0)
Platelets: 238 10*3/uL (ref 150–400)
RBC: 2.85 MIL/uL — ABNORMAL LOW (ref 4.22–5.81)
RDW: 14.1 % (ref 11.5–15.5)
WBC: 7.9 10*3/uL (ref 4.0–10.5)

## 2017-07-17 LAB — LIPASE, BLOOD: Lipase: 64 U/L — ABNORMAL HIGH (ref 11–51)

## 2017-07-17 LAB — HEPATITIS B CORE ANTIBODY, TOTAL: Hep B Core Total Ab: NEGATIVE

## 2017-07-17 LAB — GLUCOSE, CAPILLARY
Glucose-Capillary: 160 mg/dL — ABNORMAL HIGH (ref 65–99)
Glucose-Capillary: 163 mg/dL — ABNORMAL HIGH (ref 65–99)
Glucose-Capillary: 289 mg/dL — ABNORMAL HIGH (ref 65–99)

## 2017-07-17 LAB — HEPATITIS B SURFACE ANTIGEN: Hepatitis B Surface Ag: NEGATIVE

## 2017-07-17 LAB — HEPATITIS B SURFACE ANTIBODY, QUANTITATIVE: Hepatitis B-Post: 65.2 m[IU]/mL (ref >9.9)

## 2017-07-17 LAB — HEPATITIS C ANTIBODY: HCV Ab: <0.1 s/co ratio (ref 0.0–0.9)

## 2017-07-17 MED ORDER — PROMETHAZINE HCL 25 MG/ML IJ SOLN
INTRAMUSCULAR | Status: AC
  Filled 2017-07-17: qty 1

## 2017-07-17 MED ORDER — HEPARIN SODIUM (PORCINE) 1000 UNIT/ML DIALYSIS: 5000.0000 [IU] | Freq: Once | INTRAMUSCULAR | Status: DC

## 2017-07-17 MED ORDER — ACETAMINOPHEN 325 MG PO TABS
650.0000 mg | ORAL_TABLET | Freq: Three times a day (TID) | ORAL | Status: DC | PRN
  Administered 2017-07-17: 650 mg via ORAL
  Filled 2017-07-17: qty 2

## 2017-07-17 MED ORDER — DOXERCALCIFEROL 4 MCG/2ML IV SOLN
INTRAVENOUS | Status: AC
  Filled 2017-07-17: qty 2

## 2017-07-17 NOTE — Progress Notes (Signed)
Inpatient Diabetes Program Recommendations  AACE/ADA: New Consensus Statement on Inpatient Glycemic Control (2015)  Target Ranges:  Prepandial:   less than 140 mg/dL      Peak postprandial:   less than 180 mg/dL (1-2 hours)      Critically ill patients:  140 - 180 mg/dL   Lab Results  Component Value Date   GLUCAP 160 (H) 07/17/2017   HGBA1C 5.7 (H) 02/25/2016    Review of Glycemic Control Results for Dale [name], Dale [name] (MRN [id number]) as of 07/17/2017 16:04  Ref. Range 07/16/2017 14:33 07/16/2017 17:43 07/16/2017 22:20 07/17/2017 12:12  Glucose-Capillary Latest Ref Range: 65 - 99 mg/dL 58 (L) 185 (H) 124 (H) 160 (H)   Diabetes history: DM1 Outpatient Diabetes medications: insulin pump - Omnipod Basal 1.35 units/hour - total 32 units, CHO ratio 1:10 Current orders for Inpatient glycemic control: insulin pump thru omnipod  Has Libre continuous glucose system. HgbA1C - 6.4%% on 06/08/2017 at Endo Needs basal insulin since Type 1 DM, even if NPO.  Inpatient Diabetes Program Recommendations:     Noted patient experienced low BS of 58 mg/dL on 3/24. Patient was NPO for procedures lasting longer than expected.  Patient has current A1C of 6.4% demonstrating great control. No recs for today.  Verified on Omnipod pump patient has received a total of 28.55 units of basal insulin today. Patient goes to [facility] for pump management. Has no further questions or concerns.  Spoke with the RN about charting doses of insulin in MAR. This would be an asset for making recommendations.   Thanks, Bronson Curb, MSN, RNC-OB Diabetes Coordinator [phone] (8a-5p)

## 2017-07-17 NOTE — Progress Notes (Signed)
RN paged MD to request additional dose of Dilaudid for patient per patient's request due to increased back pain from lying on table during scans yesterday.  Dr. Enid Derry returned page and states she wants to try alternative therapies first and will order a K pad and Tylenol for patient.  RN will continue to monitor patient and make MD aware of any changes.  P.J. Linus Mako, RN

## 2017-07-17 NOTE — Procedures (Signed)
Patient seen and examined on Hemodialysis. QB 400 mL/ min UF goal 2.5L  Tolerating treatment well.  Treatment adjusted as needed.  Madelon Lips MD [facility] pgr [phone] 9:35 AM

## 2017-07-17 NOTE — Progress Notes (Signed)
Daily Rounding Note  07/17/2017, 10:04 AM  LOS: 5 days   SUBJECTIVE:   Chief complaint:  Still having "heartburn", burning discomfort in sternal region.  This despite Omeprazole 20 mg/day at home and Protonix po BID here.  Occurs with and without food.        OBJECTIVE:         Vital signs in last 24 hours:    Temp:  [97.5 F (36.4 C)-98 F (36.7 C)] 98 F (36.7 C) (03/25 0708) Pulse Rate:  [74-83] 80 (03/25 0930) Resp:  [18-20] 18 (03/25 0930) BP: (121-143)/(38-64) 126/53 (03/25 0930) SpO2:  [95 %-99 %] 97 % (03/25 0708) Weight:  [286 lb 13.1 oz (130.1 kg)] 286 lb 13.1 oz (130.1 kg) (03/25 0718) Last BM Date: 07/16/17 Filed Weights   07/14/17 1250 07/15/17 0454 07/17/17 0718  Weight: 281 lb 8.4 oz (127.7 kg) 285 lb 4.4 oz (129.4 kg) 286 lb 13.1 oz (130.1 kg)   General: obese, chronically ill looking   Heart: RRR Chest: some fine crackles in left base, o/w clear bil.  No labored breathing Abdomen: soft, obese, NT.  BS active  Extremities: BKA left, lower right leg ace wrapped and swollen but not red or hot Neuro/Psych:  Oriented x 3.  No gross deficits.    Intake/Output from previous day: No intake/output data recorded.  Intake/Output this shift: No intake/output data recorded.  Lab Results: Recent Labs    07/15/17 0446 07/16/17 0615 07/17/17 0500  WBC 7.4 7.0 7.9  HGB 9.8* 10.6* 9.6*  HCT 31.0* 33.1* 29.5*  PLT 190 222 238   BMET Recent Labs    07/15/17 0446 07/16/17 0615 07/17/17 0500  NA 129* 132* 129*  K 4.0 4.1 4.1  CL 89* 92* 90*  CO2 _0 GLUCOSE 244* 118* 261*  BUN 26* 41* 57*  CREATININE 7.42* 9.85* 12.18*  CALCIUM 8.2* 8.6* 8.2*   LFT Recent Labs    07/15/17 0446 07/16/17 0615 07/17/17 0500  PROT 6.9 7.1 6.9  ALBUMIN 2.7* 2.7* 2.5*  AST 56* 50* 63*  ALT 71* 63 64*  ALKPHOS 355* 421* 468*  BILITOT 2.2* 1.5* 1.4*   PT/INR No results for input(s): LABPROT, INR in the  last 72 hours. Hepatitis Panel No results for input(s): HEPBSAG, HCVAB, HEPAIGM, HEPBIGM in the last 72 hours.  Studies/Results: Nm Hepatobiliary Liver Func  Result Date: 07/16/2017 CLINICAL DATA:  Nausea, vomiting, RIGHT upper quadrant pain EXAM: NUCLEAR MEDICINE HEPATOBILIARY IMAGING TECHNIQUE: Sequential images of the abdomen were obtained out to 60 minutes following intravenous administration of radiopharmaceutical. RADIOPHARMACEUTICALS:  5.45 mCi Tc-42m Choletec IV COMPARISON:  None FINDINGS: Adequate tracer extraction from bloodstream. Concentration and excretion of tracer into small bowel by 10 minutes. At 60 minutes significant hepatic retention of tracer is seen. This persisted through an additional 30 minutes of imaging. Due to medication allergy, patient could not received morphine. Initial imaging showed a subtle area of questionable tracer accumulation within the gallbladder beginning at approximately 30 minutes. Delayed image at 4 hours demonstrated decrease in liver background with only mild tracer within the gallbladder. The markedly diminished degree of tracer localization within the gallbladder precludes adequate statistical analysis for assessment of gallbladder function following fatty meal stimulation, which was therefore not performed. IMPRESSION: Faint visualization of the gallbladder during the 4 hours of imaging indicating patency of the cystic duct. Patent CBD. Prolonged hepatic retention of tracer consistent with hepatocellular dysfunction. Electronically Signed  By: Lavonia Dana M.D.   On: 07/16/2017 15:33   Scheduled Meds: . allopurinol  200 mg Oral Q M,W,F-HD  . aspirin EC  81 mg Oral QHS  . calcium acetate  667 mg Oral TID WC  . [START ON 07/19/2017] darbepoetin (ARANESP) injection - DIALYSIS  40 mcg Intravenous Q Wed-HD  . doxercalciferol      . doxercalciferol  3 mcg Intravenous Q M,W,F-HD  . feeding supplement (PRO-STAT SUGAR FREE 64)  30 mL Oral BID  . gabapentin   200 mg Oral Daily  . gabapentin  200 mg Oral Q M,W,F-1800  . heparin injection (subcutaneous)  5,000 Units Subcutaneous Q8H  . [START ON 07/18/2017] heparin  5,000 Units Dialysis Once in dialysis  . levothyroxine  112 mcg Oral Q24H  . midodrine  10 mg Oral Q M,W,F-HD  . multivitamin  1 tablet Oral QHS  . pantoprazole  40 mg Oral BID  . prasugrel  10 mg Oral Daily  . promethazine      . rOPINIRole  2 mg Oral QHS   Continuous Infusions: . insulin pump    . piperacillin-tazobactam (ZOSYN)  IV Stopped (07/17/17 0259)   PRN Meds:.acetaminophen, diphenhydrAMINE, docusate sodium, heparin, HYDROmorphone, midodrine, polyethylene glycol, polyvinyl alcohol, promethazine  ASSESMENT:   *  Elevated LFTs, cholestatic picture.  Alk phos rising, transaminases and T bili improving. Looking back the trend began in 11/2016.    Gallstone, minor GB wall thickening on suboptimal  07/13/17 ultrasound   HIDA 3/24.  Patent cystic duct,  hepatic contrast retention c/w hepatocellular dyspfunction.  No indication that he has osteomyelitis which could be alternate explanation for the rise in alk phos.     *  ESRD recurrent after failed renal transplant (transplant functioned for 18 years).   *  Macrocytic anemia.  Folate, B12 levels ok.  On weekly Aranesp.    04/2016 EGD (normal) and colonoscopy (mild left sided tics).  Studies for FOB + and anemia.    *  CAD.  DES 12/2015.  Chronic Effient  *  IDDM.  Pt says last A1c was 6.7.    *   Right LE swelling and superficial wounds.  Wrapped.  On zosyn for SIRS/"sepsis".  Blood clx's negative to date.  + MRSA screening  *  Multiple chronic medical issues.  Polypharmacy   PLAN   *  MRCP?  May be challenging due to pt wt of 286# and pt's "severe claustrphobia".  Doubt it can get done.   *  CMET in AM.        Dale [name]  07/17/2017, 10:04 AM Pager: [phone]

## 2017-07-17 NOTE — Progress Notes (Signed)
Patient states he took 11 units insulin via insulin pump prior to CBG being taken because he checked his blood sugar via his Freestyle [place] monitor and it was elevated.  RN reminded patient we are checking his sugar for him and he is to go by CBG machine to make sure readings are accurate and patient voiced understanding.  P.J. Linus Mako, RN

## 2017-07-17 NOTE — Evaluation (Signed)
Physical Therapy Evaluation Patient Details Name: Dale [name] MRN: [id number] DOB: 1965-05-26 Today's Date: 07/17/2017   History of Present Illness  IYAD DEROO Jris a 52 y.o.malepresenting with fever and hypotension. PMH is significant for ESRD on HD,Chronic diastolic DGU,YQI,[id number], s/p L BKA,Hypothyroidism,Chronic pain,Gout,GERD, andHLD.  Clinical Impression  Patient participated in limited evaluation due to nausea and reports too groggy from nausea meds to stand.  Feel he is likely not far from baseline, but may need skilled PT during acute stay to ensure pt able to d/c home with wife assistance.  Will follow up when pt can participate fully.    Follow Up Recommendations Supervision/Assistance - 24 hour;No PT follow up    Equipment Recommendations  None recommended by PT    Recommendations for Other Services       Precautions / Restrictions Precautions Precautions: Fall Required Braces or Orthoses: Other Brace/Splint Other Brace/Splint: L BK prosthesis      Mobility  Bed Mobility               General bed mobility comments: sitting EOB moving about to reposition, etc unaided  Transfers                 General transfer comment: deferred per pt request due to just took nausea medication and it makes him too groggy/unsteady  Ambulation/Gait                Stairs            Wheelchair Mobility    Modified Rankin (Stroke Patients Only)       Balance Overall balance assessment: Needs assistance   Sitting balance-Leahy Scale: Good         Standing balance comment: NT                             Pertinent Vitals/Pain Pain Assessment: Faces Faces Pain Scale: Hurts little more Pain Location: abdomen Pain Descriptors / Indicators: Aching Pain Intervention(s): Monitored during session;Limited activity within patient's tolerance    Home Living Family/patient expects to be discharged to::  Private residence Living Arrangements: Spouse/significant other Available Help at Discharge: Family;Available 24 hours/day Type of Home: House Home Access: Ramped entrance     Home Layout: One level Home Equipment: Walker - 4 wheels;Walker - 2 wheels;Shower seat;Grab bars - tub/shower;Electric scooter;Wheelchair - manual;Other (comment)(temperpedic bed, home O2)      Prior Function Level of Independence: Independent with assistive device(s)         Comments: wife drives to dialysis     Hand Dominance        Extremity/Trunk Assessment   Upper Extremity Assessment Upper Extremity Assessment: Overall WFL for tasks assessed    Lower Extremity Assessment Lower Extremity Assessment: RLE deficits/detail;LLE deficits/detail RLE Deficits / Details: reports bone on bone arthritis R knee, able to extend, but strength NT due to pain on anterior shin from thin skin with unna boot on, limited ankle DF and reports has neuropathy RLE Sensation: history of peripheral neuropathy LLE Deficits / Details: L BKA WFL       Communication   Communication: No difficulties  Cognition Arousal/Alertness: Awake/alert Behavior During Therapy: WFL for tasks assessed/performed Overall Cognitive Status: Within Functional Limits for tasks assessed  General Comments      Exercises     Assessment/Plan    PT Assessment Patient needs continued PT services  PT Problem List Decreased strength;Decreased mobility;Decreased activity tolerance;Decreased balance;Decreased knowledge of use of DME       PT Treatment Interventions DME instruction;Therapeutic activities;Gait training;Therapeutic exercise;Patient/family education;Functional mobility training;Balance training    PT Goals (Current goals can be found in the Care Plan section)  Acute Rehab PT Goals Patient Stated Goal: To feel better and go home PT Goal Formulation: With patient Time  For Goal Achievement: 07/31/17 Potential to Achieve Goals: Good    Frequency Min 3X/week   Barriers to discharge        Co-evaluation               AM-PAC PT "6 Clicks" Daily Activity  Outcome Measure Difficulty turning over in bed (including adjusting bedclothes, sheets and blankets)?: A Little Difficulty moving from lying on back to sitting on the side of the bed? : A Little Difficulty sitting down on and standing up from a chair with arms (e.g., wheelchair, bedside commode, etc,.)?: A Lot Help needed moving to and from a bed to chair (including a wheelchair)?: A Little Help needed walking in hospital room?: A Lot Help needed climbing 3-5 steps with a railing? : A Lot 6 Click Score: 15    End of Session   Activity Tolerance: Patient limited by lethargy Patient left: in bed;with call bell/phone within reach   PT Visit Diagnosis: Muscle weakness (generalized) (M62.81)    Time: 4650-3546 PT Time Calculation (min) (ACUTE ONLY): 20 min   Charges:   PT Evaluation $PT Eval Moderate Complexity: 1 Mod     PT G CodesMagda Kiel, [place] [phone] 07/17/2017   Reginia Naas 07/17/2017, 5:31 PM

## 2017-07-17 NOTE — Progress Notes (Addendum)
Family Medicine Teaching Service Daily Progress Note Intern Pager: [phone]  Patient name: Dale [name] Medical record number: [id number] Date of birth: May 05, 1965 Age: 52 y.o. Gender: male  Primary Care Provider: Mercy Riding, MD Consultants: Nephrology Code Status: Full  Pt Overview and Major Events to Date:  Erikson Danzy is a 52 y.o. male presenting with fever and hypotension . PMH is significant for ESRD on HD, Chronic diastolic CHF, CAD, Insulin-dependent T1DM, s/p L BKA, Hypothyroidism, Chronic pain, Gout, GERD, and HLD.  Assessment and Plan:  SIRS with uncertin source: may be a GI source given rising Alk phos at 268 today, elevated GTT at 264. Denying abdominal pain, only has nausea and reflux symptoms. Additionally s/p fistulagram on 3/19. - Will stop Zosyn (3/21-25) given no clear source of infection> may restart if patient decompensates - GI following, appreciate recommendations - s/p Vanc (3/20-23) and aztreonam (3/20-21) per pharmacy  - Monitor I's/O's - Blood cultures with NGx4d  Transaminitis: patient w rising Alk phos at [phone], elevated GTT at 264. Nauseas. Febrile on admission, which resolved with antibiotics, and leukocytosis resolved. HIDA scan with Patent CBD. Prolonged hepatic retention of tracer consistent with hepatocellular dysfunction. - GI consulted, appreciate recs  ESRD on HD, MWF:s/p kidney transplant in 1995.  - Nephrology consulted and is following, will keep HD schedule -Follow daily wts and I's/O's - Patient in HD MWF   GERD: on Prilosec 65m BID per patient as well as Reglan starting 3 weeks ago. Complaining of nausea this AM and foul taste in his mouth consistent with normal reflux symptoms. - Protonix 466mBID, titrate down when symptoms have improved - phenergan 12.5 mg q4H PRN  Insulin-dependent T1DM: Insulin pump- Omnipod- changes every 3 days -A1c 5.7% 02/25/2016 -Continue insulin pump -Check CBG's every 4  hours  Chronic HFpEF: ECHO (11/01/16) with EF 60-65%, moderate LVH, G1DD, elevated LV filling pressure, trivial MR, mild TR, RVSP 39 mmHg, normal IVC.On metoprolol 2544mID  -Follow daily wts and I/O -Holding beta-blocker d/t hypotension  CAD: hx of stents, No anginal complaints -EKG without acute changes -Troponin undetectable -Continue Lipitor, Effient, and ASA 81  Anemia: Hgb on admission 10.3> 9.6- baseline appears to be 11.5-12.5 - Monitor on daily CBC  - Folate 1,954 - Continue Aranesp 91m67m Wed  S/p L BKA: Chronic, stable. - Up with assistance  Hypothyroidism: On synthroid 112mc9m home -TSH normal -Continue Synthroid  Chronic pain: Followed by rheumatology per patient. Attributed to low back degenerative disease. Continue home-regimen as tolerated. - increased to Dilaudid 2mg T54mprn while in hospital - gabapentin 300mg T22mpt reports taking it BID)-we will renally dose to gabapentin 200 mg/day with patient getting an extra 200 mg after HD sessions. - Bowel regimen to prevent constipation  Gout: On allopurinol 200mg da76m - Will renally dose to 200 mg on days with HD  HLD: - fenofibrate - holding atorvastatin  Chronic Venous Stasis of RLE: - Wound care consulted  FEN/GI: Renal/carb modified diet, PPI Prophylaxis: Heparin per pharmacy  Disposition: continued inpatient stay  Subjective:  Patient is still having nausea and it is not improving. He is tired of being in hospital. He is getting HD now.   Objective: Temp:  [97.5 F (36.4 C)-98 F (36.7 C)] 98 F (36.7 C) (03/25 0708) Pulse Rate:  [74-83] 80 (03/25 0930) Resp:  [18-20] 18 (03/25 0930) BP: (121-143)/(38-64) 126/53 (03/25 0930) SpO2:  [95 %-99 %] 97 % (03/25 0708) Weight:  [286[798  lb 13.1 oz (130.1 kg)] 286 lb 13.1 oz (130.1 kg) (03/25 0718) Physical Exam: General: NAD, pleasant, in HD Eyes: PERRL, EOMI, no conjunctival pallor or injection ENTM: Moist mucous  membranes Cardiovascular: RRR, no m/r/g, no LLE edema Respiratory: CTA BL, normal work of breathing Gastrointestinal: soft, nontender, nondistended, normoactive BS MSK: s/p L BKA Derm: no rashes appreciated Neuro: CN II-XII grossly intact Psych: AOx3, appropriate affect  Laboratory: Recent Labs  Lab 07/15/17 0446 07/16/17 0615 07/17/17 0500  WBC 7.4 7.0 7.9  HGB 9.8* 10.6* 9.6*  HCT 31.0* 33.1* 29.5*  PLT 190 222 238   Recent Labs  Lab 07/15/17 0446 07/16/17 0615 07/17/17 0500  NA 129* 132* 129*  K 4.0 4.1 4.1  CL 89* 92* 90*  CO2 '22 24 24  ' BUN 26* 41* 57*  CREATININE 7.42* 9.85* 12.18*  CALCIUM 8.2* 8.6* 8.2*  PROT 6.9 7.1 6.9  BILITOT 2.2* 1.5* 1.4*  ALKPHOS 355* 421* 468*  ALT 71* 63 64*  AST 56* 50* 63*  GLUCOSE 244* 118* 261*   LA 1.87>1.09 Influenza negative Vitamin B12 430 Folate 1,954 TSH 1.517 MRSA positive Lipase 47  Imaging/Diagnostic Tests: Nm Hepatobiliary Liver Func  Result Date: 07/16/2017 CLINICAL DATA:  Nausea, vomiting, RIGHT upper quadrant pain EXAM: NUCLEAR MEDICINE HEPATOBILIARY IMAGING TECHNIQUE: Sequential images of the abdomen were obtained out to 60 minutes following intravenous administration of radiopharmaceutical. RADIOPHARMACEUTICALS:  5.45 mCi Tc-47m Choletec IV COMPARISON:  None FINDINGS: Adequate tracer extraction from bloodstream. Concentration and excretion of tracer into small bowel by 10 minutes. At 60 minutes significant hepatic retention of tracer is seen. This persisted through an additional 30 minutes of imaging. Due to medication allergy, patient could not received morphine. Initial imaging showed a subtle area of questionable tracer accumulation within the gallbladder beginning at approximately 30 minutes. Delayed image at 4 hours demonstrated decrease in liver background with only mild tracer within the gallbladder. The markedly diminished degree of tracer localization within the gallbladder precludes adequate statistical  analysis for assessment of gallbladder function following fatty meal stimulation, which was therefore not performed. IMPRESSION: Faint visualization of the gallbladder during the 4 hours of imaging indicating patency of the cystic duct. Patent CBD. Prolonged hepatic retention of tracer consistent with hepatocellular dysfunction. Electronically Signed   By: MLavonia DanaM.D.   On: 07/16/2017 15:33    Samarion Ehle, JMartinique DO 07/17/2017, 9:35 AM PGY-1, CMojave Ranch EstatesIntern pager: [phone] text pages welcome

## 2017-07-17 NOTE — Progress Notes (Signed)
[facility] Progress Note   Subjective:  Seen on HD.  HIDA scan showed patent CBD but decreased hepatocellular uptake  Objective Vitals:   07/17/17 0708 07/17/17 0718 07/17/17 0730 07/17/17 0800  BP: (!) 130/50 (!) 130/55 132/60 136/60  Pulse: 80 80 80 74  Resp:  18  18  Temp: 98 F (36.7 C)     TempSrc:      SpO2: 97%     Weight:  130.1 kg (286 lb 13.1 oz)    Height:       Physical Exam General: Obese man, NAD.  Heart: RRR; no murmur Lungs: slight L sided crackles in base otherwise clear Abdomen: mild tenderness in RUQ, no guarding Extremities: No LE edema, s/p L BKA Dialysis Access:  L UE AVF + bruit, accessed  Additional Objective Labs: Basic Metabolic Panel: Recent Labs  Lab 07/15/17 0446 07/16/17 0615 07/17/17 0500  NA 129* 132* 129*  K 4.0 4.1 4.1  CL 89* 92* 90*  CO2 22 24 24   GLUCOSE 244* 118* 261*  BUN 26* 41* 57*  CREATININE 7.42* 9.85* 12.18*  CALCIUM 8.2* 8.6* 8.2*   Liver Function Tests: Recent Labs  Lab 07/15/17 0446 07/16/17 0615 07/17/17 0500  AST 56* 50* 63*  ALT 71* 63 64*  ALKPHOS 355* 421* 468*  BILITOT 2.2* 1.5* 1.4*  PROT 6.9 7.1 6.9  ALBUMIN 2.7* 2.7* 2.5*   Recent Labs  Lab 07/14/17 1334  LIPASE 47   CBC: Recent Labs  Lab 07/12/17 1149 07/13/17 0455 07/14/17 0429 07/15/17 0446 07/16/17 0615 07/17/17 0500  WBC 15.7* 9.6 8.3 7.4 7.0 7.9  NEUTROABS 14.3*  --   --   --   --   --   HGB 10.3* 9.6* 9.3* 9.8* 10.6* 9.6*  HCT 33.2* 31.8*  30.7* 30.1* 31.0* 33.1* 29.5*  MCV 109.6* 112.4* 109.1* 107.6* 106.1* 103.5*  PLT 243 188 182 190 222 238   Blood Culture    Component Value Date/Time   SDES BLOOD BLOOD RIGHT WRIST 07/12/2017 1159   SPECREQUEST  07/12/2017 1159    BOTTLES DRAWN AEROBIC AND ANAEROBIC Blood Culture adequate volume   CULT  07/12/2017 1159    NO GROWTH 4 DAYS Performed at [facility], [place] [address] [place], [place] [address]    REPTSTATUS PENDING 07/12/2017 1159     Cardiac Enzymes: Recent Labs  Lab 07/12/17 1149  TROPONINI <0.03   CBG: Recent Labs  Lab 07/15/17 2201 07/16/17 0817 07/16/17 1433 07/16/17 1743 07/16/17 2220  GLUCAP 150* 106* 58* 185* 124*   Studies/Results: Nm Hepatobiliary Liver Func  Result Date: 07/16/2017 CLINICAL DATA:  Nausea, vomiting, RIGHT upper quadrant pain EXAM: NUCLEAR MEDICINE HEPATOBILIARY IMAGING TECHNIQUE: Sequential images of the abdomen were obtained out to 60 minutes following intravenous administration of radiopharmaceutical. RADIOPHARMACEUTICALS:  5.45 mCi Tc-76m  Choletec IV COMPARISON:  None FINDINGS: Adequate tracer extraction from bloodstream. Concentration and excretion of tracer into small bowel by 10 minutes. At 60 minutes significant hepatic retention of tracer is seen. This persisted through an additional 30 minutes of imaging. Due to medication allergy, patient could not received morphine. Initial imaging showed a subtle area of questionable tracer accumulation within the gallbladder beginning at approximately 30 minutes. Delayed image at 4 hours demonstrated decrease in liver background with only mild tracer within the gallbladder. The markedly diminished degree of tracer localization within the gallbladder precludes adequate statistical analysis for assessment of gallbladder function following fatty meal stimulation, which was therefore not performed.  IMPRESSION: Faint visualization of the gallbladder during the 4 hours of imaging indicating patency of the cystic duct. Patent CBD. Prolonged hepatic retention of tracer consistent with hepatocellular dysfunction. Electronically Signed   By: Lavonia Dana M.D.   On: 07/16/2017 15:33   Medications: . insulin pump    . piperacillin-tazobactam (ZOSYN)  IV Stopped (07/17/17 0259)   . allopurinol  200 mg Oral Q M,W,F-HD  . aspirin EC  81 mg Oral QHS  . calcium acetate  667 mg Oral TID WC  . [START ON 07/19/2017] darbepoetin (ARANESP) injection - DIALYSIS   40 mcg Intravenous Q Wed-HD  . doxercalciferol  3 mcg Intravenous Q M,W,F-HD  . feeding supplement (PRO-STAT SUGAR FREE 64)  30 mL Oral BID  . gabapentin  200 mg Oral Daily  . gabapentin  200 mg Oral Q M,W,F-1800  . heparin injection (subcutaneous)  5,000 Units Subcutaneous Q8H  . [START ON 07/18/2017] heparin  5,000 Units Dialysis Once in dialysis  . levothyroxine  112 mcg Oral Q24H  . midodrine  10 mg Oral Q M,W,F-HD  . multivitamin  1 tablet Oral QHS  . pantoprazole  40 mg Oral BID  . prasugrel  10 mg Oral Daily  . rOPINIRole  2 mg Oral QHS    Dialysis Orders: [facility] MWF 4h 31min  LUA AVF   129kg  450/800  2/2.25 bath  P2  Hep 5000 -hectorol 3  -mircera 30 q 4 weeks last 3/6  Assessment/Plan: 1. SIRS/ fever: WBC back down, BC no growth, influenza neg, CXR neg infiltrate. Was on Vanc/Aztreonam -> now zosyn. Looks better, at baseline now. Is getting GI w/u for ^LFT"s and RUQ tenderness to palpation.  2. ^LFT's: per GI, HIDA scan showing patent CBD and decreased hepatocellular uptake 3. ESRD: Continue HD per MWF schedule, next 3/25 (today) 4. AVF dysfunction: S/p PTA  80% stenosis near artrial anastomosis 6/97, patent cephalic arch stent; improved. 5. BP/volume: BP ok, on midodrine pre-HD. 6. Anemia: Hgb 9.8, on Aranesp 68mcg q Wed. 7. Metabolic bone disease: Ca ok, Phos pending. Continue Phoslo and Hectoral. 8. Nutrition: Alb 2.7, adding pro-stat 9. DM -per primary- insulin rx 10. Hypothyroidism - on synthroid 11. MRSA +contact isolation- need to continue this after d/c 12. Contrast allergy- had mild-mod erythematous skin reaction inspite of premeds of steroid/benadryl. States this always happens   Madelon Lips MD [place] pgr [phone] 07/17/2017, 9:32 AM

## 2017-07-17 NOTE — Discharge Summary (Signed)
[facility] Discharge Summary  Patient name: Dale [name] Medical record number: [id number] Date of birth: 1965/08/31 Age: 52 y.o. Gender: male Date of Admission: 07/12/2017  Date of Discharge: 07/18/17 Admitting Physician: [place] Ollie Delano, DO  Primary Care Provider: Mercy Riding, MD Consultants: Gastroenterology, Nephrology  Indication for Hospitalization: Fever and hypotension  Discharge Diagnoses/Problem List:  SIRS with unknown source Transaminitis Alkaline phosphatemia ESRD on HD, MWF GERD Insulin-dependent T1 DM Chronic HFpEF CAD S/p L BKA Hypothyroidism Chronic pain Gout HLD Chronic venous stasis of RLE  Disposition: Home  Discharge Condition: Stable  Discharge Exam:  General: NAD, pleasant Eyes: PERRL, EOMI, no conjunctival pallor or injection ENTM: Moist mucous membranes Cardiovascular: RRR, no m/r/g, no LE edema Respiratory: CTA BL, normal work of breathing Gastrointestinal: soft in upper quadrants, hard in lower quadrants, nontender, nondistended, normoactive BS MSK: s/p L BKA Derm: no rashes appreciated Neuro: CN II-XII grossly intact Psych: AOx3, appropriate affect  Brief Hospital Course:  This 52 year old male presented with fever and low blood pressure during dialysis.  The day prior to coming in patient came for fistulogram.  Patient was started on Vanco and aztreonam in the ED given that he met Sirs criteria.  There was concern for fistulogram being source of possible bacteremia and blood cultures were drawn. Aztreonam (3/20-21) was discontinued and changed to Zosyn per pharmacy.  Given how well-appearing patient was in his clinical improvement antibiotic vanc was discontinued (3/20-23).  Patient remained on Zosyn (3/21-25) until blood cultures showed no growth for 5 days.  Patient's only symptoms after admission remained nausea and heartburn. Patient also noted to have transaminitis with a rising alkaline phosphate. GI was  consulted.  Patient had right upper quadrant ultrasound which showed one gallstone in gallbladder and did not visualize liver very well.  GI then ordered a HIDA scan which showed patency of cystic duct, patent CBD, and prolonged hepatic retention of tracer consistent with hepatocellular dysfunction.  GI would have like to have MRI/MRCP to understand underlying biliary pathology however patient has history of serious claustrophobia and given that LFTs were improving GI signed off for no further testing needed.  Alkaline phos was also improving from [phone] prior to discharge.  GI recommended that patient have recheck of LFTs in 1 month after discharge during a dialysis session and if they are still elevated they would like to see him as an outpatient.  On chart review while patient was admitted in 2013 patient also had elevated alkaline phos in the setting of immunosuppression after kidney transplant.  At that time patient was noted to have adenopathy on abdominal CT scan and had biopsy of inguinal lymph node.  Biopsy of lymph node at that time was nonspecific and it was suggested to reduce immunosuppressive therapy.  Follow-up CT in 2014 showed resolution of adenopathy.  Patient may need to have repeat CT abdominal scan, however patient wanted to be discharged and preferred to have this worked up outpatient rather than remaining in hospital.  For patient's ESRD while admitted nephrology was consulted and followed along.  Patient was able to keep his same HD schedule.  Patient was also seen by wound care for venous stasis of RLE.  Prior to discharge patient remained afebrile and blood pressures returned to patient's baseline.  Issues for Follow Up:  1. Consider restarting statin as LFTs are down-trending.  Held on discharge. 2. Would recheck LFTs in a month post discharge at HD, and if still elevated, refer back to  GI Dr Hilarie Fredrickson. 3. Consider CT abdomen for reevaluation of return of adenopathy. 4. May  consider H pylori as source of patient continued worsening indigestion.   5. Patient's gabapentin and allopurinol were renally dosed while admitted.  Patient to have allopurinol 200 mg only on days with HD.  Gabapentin decreased to 200 mg/day when patient getting an extra 200 mg after HD sessions.  Ensure that patient is taking renal dosage.  Significant Procedures: HIDA scan  Significant Labs and Imaging:  Recent Labs  Lab 07/16/17 0615 07/17/17 0500 07/18/17 0443  WBC 7.0 7.9 8.1  HGB 10.6* 9.6* 9.3*  HCT 33.1* 29.5* 29.3*  PLT 222 238 247   Recent Labs  Lab 07/14/17 0429 07/15/17 0446 07/16/17 0615 07/17/17 0500 07/18/17 0443  NA 133* 129* 132* 129* 131*  K 4.4 4.0 4.1 4.1 3.5  CL 92* 89* 92* 90* 92*  CO2 '26 22 24 24 25  ' GLUCOSE 210* 244* 118* 261* 223*  BUN 49* 26* 41* 57* 28*  CREATININE 9.94* 7.42* 9.85* 12.18* 8.04*  CALCIUM 8.5* 8.2* 8.6* 8.2* 8.3*  ALKPHOS 235* 355* 421* 468* 417*  AST 71* 56* 50* 63* 55*  ALT 78* 71* 63 64* 60  ALBUMIN 2.8* 2.7* 2.7* 2.5* 2.5*   LA 1.87>1.09 Influenza negative Vitamin B12 430 Folate 1,954 TSH 1.517 MRSA positive Lipase 47>64  Dg Chest 2 View  Result Date: 07/12/2017 CLINICAL DATA:  Dialysis patient.  Lethargy and fever. EXAM: CHEST - 2 VIEW COMPARISON:  10/14/2016 FINDINGS: Mild cardiac enlargement which is chronic. Pulmonary venous hypertension without edema. No infiltrate, collapse or effusion. No significant bone finding. IMPRESSION: Cardiomegaly and pulmonary venous hypertension.  Otherwise negative. Electronically Signed   By: Nelson Chimes M.D.   On: 07/12/2017 12:31   US Abdomen Limited Ruq  Result Date: 07/13/2017 CLINICAL DATA:  Abdominal pain EXAM: ULTRASOUND ABDOMEN LIMITED RIGHT UPPER QUADRANT COMPARISON:  CT abdomen and pelvis 02/03/2016 FINDINGS: Gallbladder: Dependent nonshadowing echogenic focus within gallbladder question small dependent calculus, 10 mm diameter. Minimally prominent gallbladder wall  thickness. No pericholecystic fluid or sonographic Murphy sign. Common bile duct: Diameter: Only a short segment 4 mm diameter visualized Liver: Incomplete intrahepatic assessment likely due to body habitus. No gross hepatic mass or nodularity. Hepatic echogenicity appears normal. Portal vein is patent on color Doppler imaging with normal direction of blood flow towards the liver. No RIGHT upper quadrant free fluid IMPRESSION: Single dependent gallstone 10 mm diameter within gallbladder with minimal gallbladder wall thickening though no definite sonographic Murphy sign or pericholecystic fluid are identified. Incomplete hepatic visualization likely due to body habitus. Electronically Signed   By: Lavonia Dana M.D.   On: 07/13/2017 18:13   Nm Hepatobiliary Liver Func  Result Date: 07/16/2017 CLINICAL DATA:  Nausea, vomiting, RIGHT upper quadrant pain EXAM: NUCLEAR MEDICINE HEPATOBILIARY IMAGING TECHNIQUE: Sequential images of the abdomen were obtained out to 60 minutes following intravenous administration of radiopharmaceutical. RADIOPHARMACEUTICALS:  5.45 mCi Tc-77m Choletec IV COMPARISON:  None FINDINGS: Adequate tracer extraction from bloodstream. Concentration and excretion of tracer into small bowel by 10 minutes. At 60 minutes significant hepatic retention of tracer is seen. This persisted through an additional 30 minutes of imaging. Due to medication allergy, patient could not received morphine. Initial imaging showed a subtle area of questionable tracer accumulation within the gallbladder beginning at approximately 30 minutes. Delayed image at 4 hours demonstrated decrease in liver background with only mild tracer within the gallbladder. The markedly diminished degree of tracer localization within  the gallbladder precludes adequate statistical analysis for assessment of gallbladder function following fatty meal stimulation, which was therefore not performed. IMPRESSION: Faint visualization of the  gallbladder during the 4 hours of imaging indicating patency of the cystic duct. Patent CBD. Prolonged hepatic retention of tracer consistent with hepatocellular dysfunction. Electronically Signed   By: Lavonia Dana M.D.   On: 07/16/2017 15:33   Results/Tests Pending at Time of Discharge: none  Discharge Medications:  Allergies as of 07/18/2017      Reactions   Iodinated Diagnostic Agents Itching, Rash, Other (See Comments)   Systemic itching and transient rash appearance within hours of receiving contrast    Prednisone Other (See Comments)   Increases sugar levels   Adhesive [tape] Itching, Other (See Comments)   Blisters on arm from adhesive tape at dialysis   Amoxicillin Itching, Rash, Other (See Comments)   Has patient had a PCN reaction causing immediate rash, facial/tongue/throat swelling, SOB or lightheadedness with hypotension: Yes Has patient had a PCN reaction causing severe rash involving mucus membranes or skin necrosis: No Has patient had a PCN reaction that required hospitalization No Has patient had a PCN reaction occurring within the last 10 years: No If all of the above answers are "NO", then may proceed with Cephalosporin use.   Clindamycin/lincomycin Hives, Itching   Enalapril Rash, Cough   Mobic [meloxicam] Hives, Rash   Morphine Hives, Nausea Only, Rash   Brilinta [ticagrelor] Nausea And Vomiting   Ciprofloxacin Itching, Rash   Clopidogrel Rash   Codeine Hives, Itching, Rash   Doxycycline Rash   Hydrocodone Rash   Levofloxacin Hives, Itching   Oxycodone Rash   Rocephin [ceftriaxone] Itching      Medication List    STOP taking these medications   acetaminophen 500 MG tablet Commonly known as:  TYLENOL   atorvastatin 40 MG tablet Commonly known as:  LIPITOR   fenofibrate 48 MG tablet Commonly known as:  TRICOR   mupirocin ointment 2 % Commonly known as:  BACTROBAN   OXYGEN   promethazine 25 MG tablet Commonly known as:  PHENERGAN   WOUND [place]  Liqd     TAKE these medications   allopurinol 100 MG tablet Commonly known as:  ZYLOPRIM TAKE 2 TABLETS BY MOUTH EVERY DAY What changed:    how much to take  how to take this  when to take this   aspirin EC 81 MG tablet Take 81 mg by mouth at bedtime.   calcium acetate 667 MG capsule Commonly known as:  PHOSLO Take 667-1,334 mg by mouth See admin instructions. Take 1337ms with meals and 6624m with snacks   cetirizine 10 MG tablet Commonly known as:  ZYRTEC Take 10 mg by mouth daily.   docusate sodium 100 MG capsule Commonly known as:  COLACE Take 200 mg by mouth 2 (two) times daily.   feeding supplement (NEPRO CARB STEADY) Liqd Take 237 mLs by mouth daily.   fluticasone 50 MCG/ACT nasal spray Commonly known as:  FLONASE Place 2 sprays into both nostrils daily as needed for allergies.   FOSRENOL 1000 MG Pack Generic drug:  Lanthanum Carbonate Take 1,000 mg by mouth 3 (three) times daily.   gabapentin 300 MG capsule Commonly known as:  NEURONTIN Take 1 capsule (300 mg total) by mouth daily. What changed:  when to take this   GLUCAGON EMERGENCY 1 MG injection Generic drug:  glucagon Inject 1 mg into the muscle daily as needed (low blood sugar).   HYDROmorphone 2  MG tablet Commonly known as:  DILAUDID Take 2 mg by mouth 2 (two) times daily.   hydrOXYzine 10 MG tablet Commonly known as:  ATARAX/VISTARIL Take 10 mg by mouth every 8 (eight) hours as needed for anxiety or vomiting.   insulin lispro 100 UNIT/ML injection Commonly known as:  HUMALOG Inject 1-20 Units into the skin 3 (three) times daily. Blood glucose - 120, 1 unit for every 20 points over 120 ,  PT RECEIVES FROM INSULIN PUMP   levothyroxine 112 MCG tablet Commonly known as:  SYNTHROID, LEVOTHROID Take 112 mcg by mouth every evening. 2 hours after dinner   metoprolol tartrate 25 MG tablet Commonly known as:  LOPRESSOR Take 1 tablet (25 mg total) by mouth 2 (two) times daily.   midodrine  10 MG tablet Commonly known as:  PROAMATINE Take 1 tablet (10 mg total) by mouth 3 (three) times daily. Takes 1 tab in mornings of dialysis and takes 2 tabs 30 mins before starting dialysis (m,w,f)   multivitamin Tabs tablet Take 1 tablet by mouth daily.   nitroGLYCERIN 0.4 MG SL tablet Commonly known as:  NITROSTAT Place 1 tablet (0.4 mg total) under the tongue every 5 (five) minutes as needed for chest pain.   omeprazole 20 MG tablet Commonly known as:  PRILOSEC OTC Take 20 mg by mouth daily.   polyethylene glycol packet Commonly known as:  MIRALAX / GLYCOLAX Take 17 g by mouth daily. What changed:    when to take this  reasons to take this   prasugrel 10 MG Tabs tablet Commonly known as:  EFFIENT TAKE ONE TABLET BY MOUTH EVERY DAY What changed:    how much to take  how to take this  when to take this   rOPINIRole 2 MG tablet Commonly known as:  REQUIP TAKE 1 TABLET(2 MG) BY MOUTH EVERY NIGHT   SYSTANE 0.4-0.3 % Soln Generic drug:  Polyethyl Glycol-Propyl Glycol Place 1 drop into both eyes as needed (dry eyes).   testosterone cypionate 200 MG/ML injection Commonly known as:  DEPOTESTOSTERONE CYPIONATE INJECT 200 MG INTO THE MUSCLE EVERY 14 DAYS   VELPHORO 500 MG chewable tablet Generic drug:  sucroferric oxyhydroxide       Discharge Instructions: Please refer to Patient Instructions section of EMR for full details.  Patient was counseled important signs and symptoms that should prompt return to medical care, changes in medications, dietary instructions, activity restrictions, and follow up appointments.   Follow-Up Appointments: [place] Follow up on 07/18/2017.   Why:  home health services arranged Contact information: [place] [address] [phone]           Daniya Aramburo, [place], DO 07/19/2017, 11:24 AM PGY-1, [place]

## 2017-07-18 LAB — CBC
HCT: 29.3 % — ABNORMAL LOW (ref 39.0–52.0)
Hemoglobin: 9.3 g/dL — ABNORMAL LOW (ref 13.0–17.0)
MCH: 33.1 pg (ref 26.0–34.0)
MCHC: 31.7 g/dL (ref 30.0–36.0)
MCV: 104.3 fL — ABNORMAL HIGH (ref 78.0–100.0)
Platelets: 247 10*3/uL (ref 150–400)
RBC: 2.81 MIL/uL — ABNORMAL LOW (ref 4.22–5.81)
RDW: 14.1 % (ref 11.5–15.5)
WBC: 8.1 10*3/uL (ref 4.0–10.5)

## 2017-07-18 LAB — COMPREHENSIVE METABOLIC PANEL
ALT: 60 U/L (ref 17–63)
AST: 55 U/L — ABNORMAL HIGH (ref 15–41)
Albumin: 2.5 g/dL — ABNORMAL LOW (ref 3.5–5.0)
Alkaline Phosphatase: 417 U/L — ABNORMAL HIGH (ref 38–126)
Anion gap: 14 (ref 5–15)
BUN: 28 mg/dL — ABNORMAL HIGH (ref 6–20)
CO2: 25 mmol/L (ref 22–32)
Calcium: 8.3 mg/dL — ABNORMAL LOW (ref 8.9–10.3)
Chloride: 92 mmol/L — ABNORMAL LOW (ref 101–111)
Creatinine, Ser: 8.04 mg/dL — ABNORMAL HIGH (ref 0.61–1.24)
GFR calc Af Amer: 8 mL/min — ABNORMAL LOW (ref >60)
GFR calc non Af Amer: 7 mL/min — ABNORMAL LOW (ref >60)
Glucose, Bld: 223 mg/dL — ABNORMAL HIGH (ref 65–99)
Potassium: 3.5 mmol/L (ref 3.5–5.1)
Sodium: 131 mmol/L — ABNORMAL LOW (ref 135–145)
Total Bilirubin: 1 mg/dL (ref 0.3–1.2)
Total Protein: 6.5 g/dL (ref 6.5–8.1)

## 2017-07-18 LAB — GLUCOSE, CAPILLARY
Glucose-Capillary: 169 mg/dL — ABNORMAL HIGH (ref 65–99)
Glucose-Capillary: 189 mg/dL — ABNORMAL HIGH (ref 65–99)

## 2017-07-18 MED ORDER — GABAPENTIN 300 MG PO CAPS: 300.0000 mg | ORAL_CAPSULE | Freq: Every day | ORAL | 0 refills | Status: DC

## 2017-07-18 MED ORDER — METOPROLOL TARTRATE 25 MG PO TABS
25.0000 mg | ORAL_TABLET | Freq: Two times a day (BID) | ORAL | Status: DC
  Administered 2017-07-18: 25 mg via ORAL
  Filled 2017-07-18: qty 1

## 2017-07-18 NOTE — Progress Notes (Addendum)
Family Medicine Teaching Service Daily Progress Note Intern Pager: [phone]  Patient name: Dale [name] Medical record number: [id number] Date of birth: 1966/02/14 Age: 52 y.o. Gender: male  Primary Care Provider: Mercy Riding, MD Consultants: Nephrology Code Status: Full  Pt Overview and Major Events to Date:  Dale [name] is a 52 y.o. male presenting with fever and hypotension . PMH is significant for ESRD on HD, Chronic diastolic CHF, CAD, Insulin-dependent T1DM, s/p L BKA, Hypothyroidism, Chronic pain, Gout, GERD, and HLD.  Assessment and Plan:  Transaminitis: Improving. patient w rising Alk phos at [phone], elevated GTT at 264. Nauseas. Appears patient had alkaline phosphatemia previously in 2013. Patient has had worsening reflux or past 2 weeks.  - GI consulted, appreciate recs - H. Pylori pending - Patient with previous elevated alk phos in 2013 and concern for lymphoma, however biopsy did not show lymphoma: The overall appearance is non-specific... if the the patient is currently receiving immunosuppressive medication. Reducing immunosuppressive therapy alleviates the proliferative process in many cases of plasma cells hyperplasia. If immunosuppressive therapy is not currently being used, the differential diagnoses include plasma cell variant of Castleman's disease, HIV or other infections, autoimmune diseases> no further work up performed given clinical improvement, patient also with follow up CT showing resolution of lymphadenopathy in 2014.   SIRS with uncertin source: may be a GI source. Denying abdominal pain, only has nausea and reflux symptoms. Additionally s/p fistulagram on 3/19. No further fevers since admission.  - no clear source of infection and BCx neg  - GI following, appreciate recommendations - s/p Vanc (3/20-23), Zosyn (3/21-25), and aztreonam (3/20-21)  - Monitor I's/O's - Blood cultures with NGx5d  ESRD on HD, MWF:s/p kidney transplant in 1995.  -  Nephrology consulted and is following, will keep HD schedule -Follow daily wts and I's/O's - Patient in HD MWF   GERD: on Prilosec 23m BID per patient as well as Reglan starting 3 weeks ago. Complaining of nausea this AM and foul taste in his mouth consistent with normal reflux symptoms. - Protonix 434mBID, titrate down when symptoms have improved - phenergan 12.5 mg q4H PRN  Insulin-dependent T1DM: Insulin pump- Omnipod- changes every 3 days -A1c 5.7% 02/25/2016 -Continue insulin pump -Check CBG's every 4 hours  Chronic HFpEF: ECHO (11/01/16) with EF 60-65%, moderate LVH, G1DD, elevated LV filling pressure, trivial MR, mild TR, RVSP 39 mmHg, normal IVC.On metoprolol 251mID -Follow daily wts and I/O -Will continue metoprolol   CAD: hx of stents, No anginal complaints -EKG without acute changes -Troponin undetectable -Continue Lipitor, Effient, and ASA 81  Anemia: Hgb on admission 10.3> 9.6- baseline appears to be 11.5-12.5 - Monitor on daily CBC  - Folate 1,954 - Continue Aranesp 14m78m Wed  S/p L BKA: Chronic, stable. - Up with assistance  Hypothyroidism: On synthroid 112mc39m home -TSH normal -Continue Synthroid  Chronic pain: Followed by rheumatology per patient. Attributed to low back degenerative disease. Continue home-regimen as tolerated. - increased to Dilaudid 2mg T19mprn while in hospital - gabapentin 300mg T72mpt reports taking it BID)-we will renally dose to gabapentin 200 mg/day with patient getting an extra 200 mg after HD sessions. - Bowel regimen to prevent constipation  Gout: On allopurinol 200mg da109m - Will renally dose to 200 mg on days with HD  HLD: - fenofibrate - holding atorvastatin  Chronic Venous Stasis of RLE: - Wound care consulted  FEN/GI: Renal/carb modified diet, PPI Prophylaxis: Heparin per  pharmacy  Disposition: medically stable, pending GI recs  Subjective:  Patient feeling better today. He  says his nausea is actually improved this morning. He is interested in going home for comfort and risk of infection exposure in hospital.   Objective: Temp:  [97.7 F (36.5 C)-98 F (36.7 C)] 98 F (36.7 C) (03/26 0500) Pulse Rate:  [74-86] 79 (03/26 0500) Resp:  [16-20] 18 (03/26 0500) BP: (126-152)/(44-63) 136/44 (03/26 0500) SpO2:  [94 %-100 %] 100 % (03/26 0500) Weight:  [281 lb 1.4 oz (127.5 kg)-286 lb 13.1 oz (130.1 kg)] 285 lb 0.9 oz (129.3 kg) (03/26 0500) Physical Exam: General: NAD, pleasant Eyes: PERRL, EOMI, no conjunctival pallor or injection ENTM: Moist mucous membranes Cardiovascular: RRR, no m/r/g, no LE edema Respiratory: CTA BL, normal work of breathing Gastrointestinal: soft, nontender, nondistended, normoactive BS MSK: s/p L BKA Derm: no rashes appreciated Neuro: CN II-XII grossly intact Psych: AOx3, appropriate affect  Laboratory: Recent Labs  Lab 07/16/17 0615 07/17/17 0500 07/18/17 0443  WBC 7.0 7.9 8.1  HGB 10.6* 9.6* 9.3*  HCT 33.1* 29.5* 29.3*  PLT 222 238 247   Recent Labs  Lab 07/16/17 0615 07/17/17 0500 07/18/17 0443  NA 132* 129* 131*  K 4.1 4.1 3.5  CL 92* 90* 92*  CO2 '24 24 25  ' BUN 41* 57* 28*  CREATININE 9.85* 12.18* 8.04*  CALCIUM 8.6* 8.2* 8.3*  PROT 7.1 6.9 6.5  BILITOT 1.5* 1.4* 1.0  ALKPHOS 421* 468* 417*  ALT 63 64* 60  AST 50* 63* 55*  GLUCOSE 118* 261* 223*   LA 1.87>1.09 Influenza negative Vitamin B12 430 Folate 1,954 TSH 1.517 MRSA positive Lipase 47  Imaging/Diagnostic Tests: Nm Hepatobiliary Liver Func  Result Date: 07/16/2017 CLINICAL DATA:  Nausea, vomiting, RIGHT upper quadrant pain EXAM: NUCLEAR MEDICINE HEPATOBILIARY IMAGING TECHNIQUE: Sequential images of the abdomen were obtained out to 60 minutes following intravenous administration of radiopharmaceutical. RADIOPHARMACEUTICALS:  5.45 mCi Tc-67m Choletec IV COMPARISON:  None FINDINGS: Adequate tracer extraction from bloodstream. Concentration and  excretion of tracer into small bowel by 10 minutes. At 60 minutes significant hepatic retention of tracer is seen. This persisted through an additional 30 minutes of imaging. Due to medication allergy, patient could not received morphine. Initial imaging showed a subtle area of questionable tracer accumulation within the gallbladder beginning at approximately 30 minutes. Delayed image at 4 hours demonstrated decrease in liver background with only mild tracer within the gallbladder. The markedly diminished degree of tracer localization within the gallbladder precludes adequate statistical analysis for assessment of gallbladder function following fatty meal stimulation, which was therefore not performed. IMPRESSION: Faint visualization of the gallbladder during the 4 hours of imaging indicating patency of the cystic duct. Patent CBD. Prolonged hepatic retention of tracer consistent with hepatocellular dysfunction. Electronically Signed   By: MLavonia DanaM.D.   On: 07/16/2017 15:33    Darly Fails, [place] DO 07/18/2017, 6:54 AM PGY-1, [place] pager: [phone] text pages welcome

## 2017-07-18 NOTE — Progress Notes (Signed)
[name] to be D/C'd Home per MD order.  Discussed with the patient and all questions fully answered.  VSS, IV catheter discontinued intact. Site without signs and symptoms of complications. Dressing and pressure applied.  An After Visit Summary was printed and given to the patient by student RN and his instructor.  D/c education completed with patient/family including follow up instructions, medication list, d/c activities limitations if indicated, with other d/c instructions as indicated by MD.   Patient escorted via [place], and D/C home via private auto.  Celine Ahr 07/18/2017 4:33 PM

## 2017-07-18 NOTE — Progress Notes (Signed)
Daily Rounding Note  07/18/2017, 9:44 AM  LOS: 6 days   SUBJECTIVE:   Chief complaint: heartburn is better.  Nausea is better.  Asking if he might go home today.  C/o disturbance from loud visitors to pt across hall at 1 AM.        OBJECTIVE:         Vital signs in last 24 hours:    Temp:  [97.7 F (36.5 C)-98 F (36.7 C)] 98 F (36.7 C) (03/26 0500) Pulse Rate:  [76-86] 79 (03/26 0500) Resp:  [16-18] 18 (03/26 0500) BP: (132-152)/(44-63) 136/44 (03/26 0500) SpO2:  [94 %-100 %] 100 % (03/26 0500) Weight:  [281 lb 1.4 oz (127.5 kg)-285 lb 0.9 oz (129.3 kg)] 285 lb 0.9 oz (129.3 kg) (03/26 0500) Last BM Date: 07/16/17 Filed Weights   07/17/17 1118 07/17/17 1146 07/18/17 0500  Weight: 281 lb 1.4 oz (127.5 kg) 285 lb 0.9 oz (129.3 kg) 285 lb 0.9 oz (129.3 kg)   General: pleasant, obese, unhealthy but not acutely ill looking.  Fully alert and appropriate   Heart: RRR Chest: clear bil.  No labored breathing or cough. Abdomen: soft, NT, ND, obese, active BS  Extremities: s/p left BKA.  Right LE ace wrapped.    Neuro/Psych:  Pleasant, calm, intelligent, moves all 4 limbs, no gross deficits.    Intake/Output from previous day: 03/25 0701 - 03/26 0700 In: 200 [P.O.:200] Out: 2000   Intake/Output this shift: No intake/output data recorded.  Lab Results: Recent Labs    07/16/17 0615 07/17/17 0500 07/18/17 0443  WBC 7.0 7.9 8.1  HGB 10.6* 9.6* 9.3*  HCT 33.1* 29.5* 29.3*  PLT 222 238 247   BMET Recent Labs    07/16/17 0615 07/17/17 0500 07/18/17 0443  NA 132* 129* 131*  K 4.1 4.1 3.5  CL 92* 90* 92*  CO2 '24 24 25  ' GLUCOSE 118* 261* 223*  BUN 41* 57* 28*  CREATININE 9.85* 12.18* 8.04*  CALCIUM 8.6* 8.2* 8.3*   LFT Recent Labs    07/16/17 0615 07/17/17 0500 07/18/17 0443  PROT 7.1 6.9 6.5  ALBUMIN 2.7* 2.5* 2.5*  AST 50* 63* 55*  ALT 63 64* 60  ALKPHOS 421* 468* 417*  BILITOT 1.5* 1.4* 1.0    PT/INR No results for input(s): LABPROT, INR in the last 72 hours. Hepatitis Panel Recent Labs    07/16/17 0615  HEPBSAG Negative  HCVAB <0.1    Studies/Results: Nm Hepatobiliary Liver Func  Result Date: 07/16/2017 CLINICAL DATA:  Nausea, vomiting, RIGHT upper quadrant pain EXAM: NUCLEAR MEDICINE HEPATOBILIARY IMAGING TECHNIQUE: Sequential images of the abdomen were obtained out to 60 minutes following intravenous administration of radiopharmaceutical. RADIOPHARMACEUTICALS:  5.45 mCi Tc-25m Choletec IV COMPARISON:  None FINDINGS: Adequate tracer extraction from bloodstream. Concentration and excretion of tracer into small bowel by 10 minutes. At 60 minutes significant hepatic retention of tracer is seen. This persisted through an additional 30 minutes of imaging. Due to medication allergy, patient could not received morphine. Initial imaging showed a subtle area of questionable tracer accumulation within the gallbladder beginning at approximately 30 minutes. Delayed image at 4 hours demonstrated decrease in liver background with only mild tracer within the gallbladder. The markedly diminished degree of tracer localization within the gallbladder precludes adequate statistical analysis for assessment of gallbladder function following fatty meal stimulation, which was therefore not performed. IMPRESSION: Faint visualization of the gallbladder during the 4 hours of imaging indicating  patency of the cystic duct. Patent CBD. Prolonged hepatic retention of tracer consistent with hepatocellular dysfunction. Electronically Signed   By: Lavonia Dana M.D.   On: 07/16/2017 15:33   Scheduled Meds: . allopurinol  200 mg Oral Q M,W,F-HD  . aspirin EC  81 mg Oral QHS  . calcium acetate  667 mg Oral TID WC  . [START ON 07/19/2017] darbepoetin (ARANESP) injection - DIALYSIS  40 mcg Intravenous Q Wed-HD  . doxercalciferol  3 mcg Intravenous Q M,W,F-HD  . feeding supplement (PRO-STAT SUGAR FREE 64)  30 mL Oral  BID  . gabapentin  200 mg Oral Daily  . gabapentin  200 mg Oral Q M,W,F-1800  . heparin injection (subcutaneous)  5,000 Units Subcutaneous Q8H  . levothyroxine  112 mcg Oral Q24H  . metoprolol tartrate  25 mg Oral BID  . midodrine  10 mg Oral Q M,W,F-HD  . multivitamin  1 tablet Oral QHS  . pantoprazole  40 mg Oral BID  . prasugrel  10 mg Oral Daily  . rOPINIRole  2 mg Oral QHS   Continuous Infusions: . insulin pump     PRN Meds:.acetaminophen, diphenhydrAMINE, docusate sodium, HYDROmorphone, midodrine, polyethylene glycol, polyvinyl alcohol, promethazine   ASSESMENT:   *  Elevated LFTs, cholestatic picture.  Transaminases, T bili improved as alk phos increased, now alk phos trending down.  Etiology not clear.   Gallstone, minor GB wall thickening on suboptimal  07/13/17 ultrasound.  HIDA 3/24.  Patent cystic duct,  hepatic contrast retention c/w hepatocellular dyspfunction.  Pt not able to undergo MRCP due to claustrophobia.  High risk for sedation and ERCP.     *  ESRD recurrent after failed renal transplant (transplant functioned for 18 years).  MWF HD.  *  Macrocytic anemia.  Folate, B12 levels ok.  On weekly Aranesp.    04/2016 EGD (normal) and colonoscopy (mild left sided tics).  Studies for FOB + and anemia.    *  CAD.  DES 12/2015.  Chronic Effient.  *  IDDM.  Pt says last A1c was 6.7.    *   Right LE swelling and superficial wounds.  Wrapped.  On zosyn for SIRS/"sepsis".  Blood clx's negative to date.  + MRSA screening  *  Multiple chronic medical issues.  Polypharmacy    PLAN   *  With LFTs trending down, plan is to observe.   Would recheck LFTs in a month post discharge, if still elevated, refer back to GI Dr Hilarie Fredrickson.  Labs most easily obtained at HD center.      Azucena Freed  07/18/2017, 9:44 AM Pager: [phone]

## 2017-07-18 NOTE — Progress Notes (Signed)
Physical Therapy Treatment Patient Details Name: Dale [name] MRN: [id number] DOB: 1966-01-18 Today's Date: 07/18/2017    History of Present Illness Dale [name] Dale [name] a 52 y.o.malepresenting with fever and hypotension. PMH is significant for ESRD on HD,Chronic diastolic WUX,LKG,[id number], s/p L BKA,Hypothyroidism,Chronic pain,Gout,GERD, andHLD.    PT Comments    Pt is feeling better today and agreeable to working with PT. Pt is currently mod I for bed mobility, minA for transfers and minA for ambulation of 16 feet with RW. Pt reports that he is not at his baseline level of gait and balance. PT recommends HHPT at d/c to assist in returning pt to PLOF. CSW notified of request prior to d/c.      Follow Up Recommendations  Supervision/Assistance - 24 hour;No PT follow up     Equipment Recommendations  None recommended by PT       Precautions / Restrictions Precautions Precautions: Fall Required Braces or Orthoses: Other Brace/Splint Other Brace/Splint: L BK prosthesis Restrictions Weight Bearing Restrictions: No    Mobility  Bed Mobility               General bed mobility comments: sitting EoB on entry   Transfers Overall transfer level: Needs assistance Equipment used: Rolling walker (2 wheeled) Transfers: Sit to/from Stand Sit to Stand: Min assist         General transfer comment: minA for steadying with RW, good power up  Ambulation/Gait Ambulation/Gait assistance: Min assist Ambulation Distance (Feet): 16 Feet Assistive device: Rolling walker (2 wheeled) Gait Pattern/deviations: Trunk flexed;Shuffle;Step-through pattern;Decreased step length - right;Decreased step length - left Gait velocity: slowed Gait velocity interpretation: Below normal speed for age/gender General Gait Details: minA for steadying with RW, vc for looking up and out and for anterior pelvic tilt, mild instability no overt LoB,        Balance Overall balance  assessment: Needs assistance   Sitting balance-Leahy Scale: Good     Standing balance support: Bilateral upper extremity supported Standing balance-Leahy Scale: Fair Standing balance comment: utilizes RW for balance                            Cognition Arousal/Alertness: Awake/alert Behavior During Therapy: WFL for tasks assessed/performed Overall Cognitive Status: Within Functional Limits for tasks assessed                                           General Comments General comments (skin integrity, edema, etc.): Wife present during session       Pertinent Vitals/Pain Pain Assessment: No/denies pain    Home Living Family/patient expects to be discharged to:: Private residence Living Arrangements: Spouse/significant other Available Help at Discharge: Family;Available 24 hours/day Type of Home: House Home Access: Ramped entrance   Home Layout: One level Home Equipment: Walker - 4 wheels;Walker - 2 wheels;Shower seat;Grab bars - tub/shower;Electric scooter;Wheelchair - manual;Other (comment)(temperpedic bed, home O2)      Prior Function Level of Independence: Independent with assistive device(s)      Comments: wife drives to dialysis   PT Goals (current goals can now be found in the care plan section) Acute Rehab PT Goals Patient Stated Goal: To feel better and go home PT Goal Formulation: With patient Time For Goal Achievement: 07/31/17 Potential to Achieve Goals: Good Progress towards PT goals: Progressing toward goals  Frequency    Min 3X/week      PT Plan Discharge plan needs to be updated       AM-PAC PT "6 Clicks" Daily Activity  Outcome Measure  Difficulty turning over in bed (including adjusting bedclothes, sheets and blankets)?: A Little Difficulty moving from lying on back to sitting on the side of the bed? : A Little Difficulty sitting down on and standing up from a chair with arms (e.g., wheelchair, bedside  commode, etc,.)?: A Little Help needed moving to and from a bed to chair (including a wheelchair)?: A Little Help needed walking in hospital room?: A Little Help needed climbing 3-5 steps with a railing? : A Lot 6 Click Score: 17    End of Session Equipment Utilized During Treatment: Gait belt Activity Tolerance: Patient limited by lethargy Patient left: in bed;with call bell/phone within reach Nurse Communication: Mobility status PT Visit Diagnosis: Muscle weakness (generalized) (M62.81)     Time: 1511-1530 PT Time Calculation (min) (ACUTE ONLY): 19 min  Charges:  $Gait Training: 8-22 mins                    G Codes:       Kaloni Bisaillon B. Migdalia Dk PT, DPT Acute Rehabilitation  [phone] Pager [phone]     [place] 07/18/2017, 4:48 PM

## 2017-07-18 NOTE — Progress Notes (Signed)
[facility] Progress Note   Subjective:  Seen in room. Tells me feel the best today that he has all week. No N/V today. Possible being discharged home, per his report.  Objective Vitals:   07/17/17 1151 07/17/17 2100 07/18/17 0500 07/18/17 1139  BP: (!) 138/54 (!) 152/49 (!) 136/44 (!) 129/56  Pulse: 82 86 79 76  Resp: 18 18 18    Temp: 97.9 F (36.6 C) 97.7 F (36.5 C) 98 F (36.7 C)   TempSrc: Oral Oral Oral   SpO2: 96% 94% 100%   Weight:   129.3 kg (285 lb 0.9 oz)   Height:       Physical Exam General: Obese man, NAD.  Heart: RRR; no murmur Lungs: CTAB Extremities: S/p L BKA, RLE wrapped, no edema. Dialysis Access:  LUE AVF + thrill  Additional Objective Labs: Basic Metabolic Panel: Recent Labs  Lab 07/16/17 0615 07/17/17 0500 07/18/17 0443  NA 132* 129* 131*  K 4.1 4.1 3.5  CL 92* 90* 92*  CO2 24 24 25   GLUCOSE 118* 261* 223*  BUN 41* 57* 28*  CREATININE 9.85* 12.18* 8.04*  CALCIUM 8.6* 8.2* 8.3*   Liver Function Tests: Recent Labs  Lab 07/16/17 0615 07/17/17 0500 07/18/17 0443  AST 50* 63* 55*  ALT 63 64* 60  ALKPHOS 421* 468* 417*  BILITOT 1.5* 1.4* 1.0  PROT 7.1 6.9 6.5  ALBUMIN 2.7* 2.5* 2.5*   Recent Labs  Lab 07/14/17 1334 07/17/17 1526  LIPASE 47 64*   CBC: Recent Labs  Lab 07/12/17 1149  07/14/17 0429 07/15/17 0446 07/16/17 0615 07/17/17 0500 07/18/17 0443  WBC 15.7*   < > 8.3 7.4 7.0 7.9 8.1  NEUTROABS 14.3*  --   --   --   --   --   --   HGB 10.3*   < > 9.3* 9.8* 10.6* 9.6* 9.3*  HCT 33.2*   < > 30.1* 31.0* 33.1* 29.5* 29.3*  MCV 109.6*   < > 109.1* 107.6* 106.1* 103.5* 104.3*  PLT 243   < > 182 190 222 238 247   < > = values in this interval not displayed.   Studies/Results: Nm Hepatobiliary Liver Func  Result Date: 07/16/2017 CLINICAL DATA:  Nausea, vomiting, RIGHT upper quadrant pain EXAM: NUCLEAR MEDICINE HEPATOBILIARY IMAGING TECHNIQUE: Sequential images of the abdomen were obtained out to 60 minutes  following intravenous administration of radiopharmaceutical. RADIOPHARMACEUTICALS:  5.45 mCi Tc-52m  Choletec IV COMPARISON:  None FINDINGS: Adequate tracer extraction from bloodstream. Concentration and excretion of tracer into small bowel by 10 minutes. At 60 minutes significant hepatic retention of tracer is seen. This persisted through an additional 30 minutes of imaging. Due to medication allergy, patient could not received morphine. Initial imaging showed a subtle area of questionable tracer accumulation within the gallbladder beginning at approximately 30 minutes. Delayed image at 4 hours demonstrated decrease in liver background with only mild tracer within the gallbladder. The markedly diminished degree of tracer localization within the gallbladder precludes adequate statistical analysis for assessment of gallbladder function following fatty meal stimulation, which was therefore not performed. IMPRESSION: Faint visualization of the gallbladder during the 4 hours of imaging indicating patency of the cystic duct. Patent CBD. Prolonged hepatic retention of tracer consistent with hepatocellular dysfunction. Electronically Signed   By: Lavonia Dana M.D.   On: 07/16/2017 15:33   Medications: . insulin pump     . allopurinol  200 mg Oral Q M,W,F-HD  . aspirin EC  81 mg  Oral QHS  . calcium acetate  667 mg Oral TID WC  . [START ON 07/19/2017] darbepoetin (ARANESP) injection - DIALYSIS  40 mcg Intravenous Q Wed-HD  . doxercalciferol  3 mcg Intravenous Q M,W,F-HD  . feeding supplement (PRO-STAT SUGAR FREE 64)  30 mL Oral BID  . gabapentin  200 mg Oral Daily  . gabapentin  200 mg Oral Q M,W,F-1800  . heparin injection (subcutaneous)  5,000 Units Subcutaneous Q8H  . levothyroxine  112 mcg Oral Q24H  . metoprolol tartrate  25 mg Oral BID  . midodrine  10 mg Oral Q M,W,F-HD  . multivitamin  1 tablet Oral QHS  . pantoprazole  40 mg Oral BID  . prasugrel  10 mg Oral Daily  . rOPINIRole  2 mg Oral QHS    [facility] 129kg 450/800 2/2.25 bath P2 Hep 5000 -hectorol 3 -mircera 30 q 4 weeks last 3/6  Assessment/Plan: 1. SIRS/ fever: WBC back down,BC no growth,influenza neg, CXR neg infiltrate. Was on Vanc/Aztreonam -> now zosyn. Looks better, at baseline now. 2. ^LFT's: per GI, HIDA scan showing patent CBD and decreased hepatocellular uptake. Resolving now. 3. ESRD: Continue HD per MWF schedule, next 3/27 4. AVF dysfunction: S/p PTA 80% stenosis near artrial anastomosis 4/97, patent cephalic arch stent; improved. 5. BP/volume: BP ok, on midodrine pre-HD. 6. Anemia: Hgb 9.3, on Aranesp 57mcg q Wed. 7. Metabolic bone disease: Ca ok, Phos pending. Continue Phoslo and Hectoral. 8. Nutrition: Alb 2.5, continue pro-stat 9. DM -per primary- insulin rx 10. Hypothyroidism - on synthroid 11. MRSA +contact isolation- need to continue this after d/c 12. Contrast allergy- had mild-mod erythematous skin reaction inspite of premeds of steroid/benadryl. States this always happens   Veneta Penton, Hershal Coria 07/18/2017, 1:17 PM  [name] Pager: [phone]

## 2017-07-18 NOTE — Care Management Note (Addendum)
Case Management Note  Patient Details  Name: Dale [name] MRN: [id number] Date of Birth: 1965/08/15  Subjective/Objective:                  Admitted with Sepsis, hx of ESRD on HD,Chronic diastolic DUK,RCV,[id number], s/p L BKA,Hypothyroidism,Chronic pain,Gout,GERD, andHLD  [name] (Spouse)     [phone]      PCP: Wendee Beavers  Action/Plan: Transition to home with home health services to follow.  Expected Discharge Date:  07/18/17               Expected Discharge Plan:  Home with home health services  In-House Referral:     Discharge planning Services  CM Consult  Post Acute Care Choice:    Choice offered to:   patient  DME Arranged:   N/A DME Agency:   N/A  HH Arranged:   PT HH Agency:   [facility]  Status of Service:  Completed, signed off  If discussed at [place] of Stay Meetings, dates discussed:    Additional Comments:  Sharin Mons, RN 07/18/2017, 3:07 PM

## 2017-07-19 DIAGNOSIS — D509 Iron deficiency anemia, unspecified: Secondary | ICD-10-CM | POA: Diagnosis not present

## 2017-07-19 DIAGNOSIS — R509 Fever, unspecified: Secondary | ICD-10-CM | POA: Diagnosis not present

## 2017-07-19 DIAGNOSIS — E1022 Type 1 diabetes mellitus with diabetic chronic kidney disease: Secondary | ICD-10-CM | POA: Diagnosis not present

## 2017-07-19 DIAGNOSIS — N186 End stage renal disease: Secondary | ICD-10-CM | POA: Diagnosis not present

## 2017-07-19 DIAGNOSIS — N2581 Secondary hyperparathyroidism of renal origin: Secondary | ICD-10-CM | POA: Diagnosis not present

## 2017-07-19 DIAGNOSIS — D631 Anemia in chronic kidney disease: Secondary | ICD-10-CM | POA: Diagnosis not present

## 2017-07-19 DIAGNOSIS — T82818A Embolism of vascular prosthetic devices, implants and grafts, initial encounter: Secondary | ICD-10-CM | POA: Diagnosis not present

## 2017-07-20 DIAGNOSIS — G4733 Obstructive sleep apnea (adult) (pediatric): Secondary | ICD-10-CM | POA: Diagnosis not present

## 2017-07-20 DIAGNOSIS — N186 End stage renal disease: Secondary | ICD-10-CM | POA: Diagnosis not present

## 2017-07-20 DIAGNOSIS — M109 Gout, unspecified: Secondary | ICD-10-CM | POA: Diagnosis not present

## 2017-07-20 DIAGNOSIS — E1051 Type 1 diabetes mellitus with diabetic peripheral angiopathy without gangrene: Secondary | ICD-10-CM | POA: Diagnosis not present

## 2017-07-20 DIAGNOSIS — I132 Hypertensive heart and chronic kidney disease with heart failure and with stage 5 chronic kidney disease, or end stage renal disease: Secondary | ICD-10-CM | POA: Diagnosis not present

## 2017-07-20 DIAGNOSIS — M797 Fibromyalgia: Secondary | ICD-10-CM | POA: Diagnosis not present

## 2017-07-20 DIAGNOSIS — E6609 Other obesity due to excess calories: Secondary | ICD-10-CM | POA: Diagnosis not present

## 2017-07-20 DIAGNOSIS — I2510 Atherosclerotic heart disease of native coronary artery without angina pectoris: Secondary | ICD-10-CM | POA: Diagnosis not present

## 2017-07-20 DIAGNOSIS — I5030 Unspecified diastolic (congestive) heart failure: Secondary | ICD-10-CM | POA: Diagnosis not present

## 2017-07-20 DIAGNOSIS — D631 Anemia in chronic kidney disease: Secondary | ICD-10-CM | POA: Diagnosis not present

## 2017-07-20 DIAGNOSIS — A419 Sepsis, unspecified organism: Secondary | ICD-10-CM | POA: Diagnosis not present

## 2017-07-20 DIAGNOSIS — K219 Gastro-esophageal reflux disease without esophagitis: Secondary | ICD-10-CM | POA: Diagnosis not present

## 2017-07-20 DIAGNOSIS — E1022 Type 1 diabetes mellitus with diabetic chronic kidney disease: Secondary | ICD-10-CM | POA: Diagnosis not present

## 2017-07-21 DIAGNOSIS — N2581 Secondary hyperparathyroidism of renal origin: Secondary | ICD-10-CM | POA: Diagnosis not present

## 2017-07-21 DIAGNOSIS — D509 Iron deficiency anemia, unspecified: Secondary | ICD-10-CM | POA: Diagnosis not present

## 2017-07-21 DIAGNOSIS — R509 Fever, unspecified: Secondary | ICD-10-CM | POA: Diagnosis not present

## 2017-07-21 DIAGNOSIS — E1022 Type 1 diabetes mellitus with diabetic chronic kidney disease: Secondary | ICD-10-CM | POA: Diagnosis not present

## 2017-07-21 DIAGNOSIS — D631 Anemia in chronic kidney disease: Secondary | ICD-10-CM | POA: Diagnosis not present

## 2017-07-21 DIAGNOSIS — T82818A Embolism of vascular prosthetic devices, implants and grafts, initial encounter: Secondary | ICD-10-CM | POA: Diagnosis not present

## 2017-07-21 DIAGNOSIS — N186 End stage renal disease: Secondary | ICD-10-CM | POA: Diagnosis not present

## 2017-07-23 DIAGNOSIS — T8612 Kidney transplant failure: Secondary | ICD-10-CM | POA: Diagnosis not present

## 2017-07-23 DIAGNOSIS — N186 End stage renal disease: Secondary | ICD-10-CM | POA: Diagnosis not present

## 2017-07-23 DIAGNOSIS — Z992 Dependence on renal dialysis: Secondary | ICD-10-CM | POA: Diagnosis not present

## 2017-07-24 DIAGNOSIS — T8612 Kidney transplant failure: Secondary | ICD-10-CM | POA: Diagnosis not present

## 2017-07-24 DIAGNOSIS — L299 Pruritus, unspecified: Secondary | ICD-10-CM | POA: Diagnosis not present

## 2017-07-24 DIAGNOSIS — N2581 Secondary hyperparathyroidism of renal origin: Secondary | ICD-10-CM | POA: Diagnosis not present

## 2017-07-24 DIAGNOSIS — D509 Iron deficiency anemia, unspecified: Secondary | ICD-10-CM | POA: Diagnosis not present

## 2017-07-24 DIAGNOSIS — Z992 Dependence on renal dialysis: Secondary | ICD-10-CM | POA: Diagnosis not present

## 2017-07-24 DIAGNOSIS — N186 End stage renal disease: Secondary | ICD-10-CM | POA: Diagnosis not present

## 2017-07-24 DIAGNOSIS — D631 Anemia in chronic kidney disease: Secondary | ICD-10-CM | POA: Diagnosis not present

## 2017-07-24 DIAGNOSIS — R52 Pain, unspecified: Secondary | ICD-10-CM | POA: Diagnosis not present

## 2017-07-24 DIAGNOSIS — T82818A Embolism of vascular prosthetic devices, implants and grafts, initial encounter: Secondary | ICD-10-CM | POA: Diagnosis not present

## 2017-07-24 DIAGNOSIS — E1022 Type 1 diabetes mellitus with diabetic chronic kidney disease: Secondary | ICD-10-CM | POA: Diagnosis not present

## 2017-07-25 ENCOUNTER — Encounter (HOSPITAL_COMMUNITY): Payer: Self-pay

## 2017-07-25 ENCOUNTER — Telehealth: Payer: Medicare Other | Admitting: Family

## 2017-07-25 ENCOUNTER — Emergency Department (HOSPITAL_COMMUNITY)
Admission: EM | Admit: 2017-07-25 | Discharge: 2017-07-25 | Disposition: A | Discharge status: Home / Self Care | Payer: Medicare Other | Attending: Emergency Medicine | Admitting: Emergency Medicine

## 2017-07-25 ENCOUNTER — Inpatient Hospital Stay: Payer: Medicare Other | Admitting: Family Medicine

## 2017-07-25 ENCOUNTER — Telehealth: Payer: Self-pay

## 2017-07-25 ENCOUNTER — Other Ambulatory Visit: Payer: Self-pay

## 2017-07-25 DIAGNOSIS — Z79899 Other long term (current) drug therapy: Secondary | ICD-10-CM | POA: Insufficient documentation

## 2017-07-25 DIAGNOSIS — E109 Type 1 diabetes mellitus without complications: Secondary | ICD-10-CM | POA: Diagnosis not present

## 2017-07-25 DIAGNOSIS — R21 Rash and other nonspecific skin eruption: Secondary | ICD-10-CM | POA: Diagnosis not present

## 2017-07-25 DIAGNOSIS — Z992 Dependence on renal dialysis: Secondary | ICD-10-CM | POA: Insufficient documentation

## 2017-07-25 DIAGNOSIS — Z794 Long term (current) use of insulin: Secondary | ICD-10-CM | POA: Diagnosis not present

## 2017-07-25 DIAGNOSIS — I5032 Chronic diastolic (congestive) heart failure: Secondary | ICD-10-CM | POA: Diagnosis not present

## 2017-07-25 DIAGNOSIS — I132 Hypertensive heart and chronic kidney disease with heart failure and with stage 5 chronic kidney disease, or end stage renal disease: Secondary | ICD-10-CM | POA: Diagnosis not present

## 2017-07-25 DIAGNOSIS — Z7982 Long term (current) use of aspirin: Secondary | ICD-10-CM | POA: Insufficient documentation

## 2017-07-25 DIAGNOSIS — R112 Nausea with vomiting, unspecified: Secondary | ICD-10-CM | POA: Diagnosis not present

## 2017-07-25 DIAGNOSIS — N186 End stage renal disease: Secondary | ICD-10-CM | POA: Diagnosis not present

## 2017-07-25 LAB — COMPREHENSIVE METABOLIC PANEL
ALT: 23 U/L (ref 17–63)
AST: 21 U/L (ref 15–41)
Albumin: 2.9 g/dL — ABNORMAL LOW (ref 3.5–5.0)
Alkaline Phosphatase: 222 U/L — ABNORMAL HIGH (ref 38–126)
Anion gap: 13 (ref 5–15)
BUN: 28 mg/dL — ABNORMAL HIGH (ref 6–20)
CO2: 27 mmol/L (ref 22–32)
Calcium: 8.7 mg/dL — ABNORMAL LOW (ref 8.9–10.3)
Chloride: 95 mmol/L — ABNORMAL LOW (ref 101–111)
Creatinine, Ser: 9.59 mg/dL — ABNORMAL HIGH (ref 0.61–1.24)
GFR calc Af Amer: 6 mL/min — ABNORMAL LOW (ref >60)
GFR calc non Af Amer: 6 mL/min — ABNORMAL LOW (ref >60)
Glucose, Bld: 227 mg/dL — ABNORMAL HIGH (ref 65–99)
Potassium: 4.5 mmol/L (ref 3.5–5.1)
Sodium: 135 mmol/L (ref 135–145)
Total Bilirubin: 0.7 mg/dL (ref 0.3–1.2)
Total Protein: 7.8 g/dL (ref 6.5–8.1)

## 2017-07-25 LAB — CBC WITH DIFFERENTIAL/PLATELET
Basophils Absolute: 0.1 10*3/uL (ref 0.0–0.1)
Basophils Relative: 1 %
Eosinophils Absolute: 1.4 10*3/uL — ABNORMAL HIGH (ref 0.0–0.7)
Eosinophils Relative: 16 %
HCT: 32.1 % — ABNORMAL LOW (ref 39.0–52.0)
Hemoglobin: 9.7 g/dL — ABNORMAL LOW (ref 13.0–17.0)
Lymphocytes Relative: 22 %
Lymphs Abs: 1.9 10*3/uL (ref 0.7–4.0)
MCH: 33.1 pg (ref 26.0–34.0)
MCHC: 30.2 g/dL (ref 30.0–36.0)
MCV: 109.6 fL — ABNORMAL HIGH (ref 78.0–100.0)
Monocytes Absolute: 0.4 10*3/uL (ref 0.1–1.0)
Monocytes Relative: 5 %
Neutro Abs: 4.9 10*3/uL (ref 1.7–7.7)
Neutrophils Relative %: 56 %
Platelets: 263 10*3/uL (ref 150–400)
RBC: 2.93 MIL/uL — ABNORMAL LOW (ref 4.22–5.81)
RDW: 14.3 % (ref 11.5–15.5)
WBC: 8.7 10*3/uL (ref 4.0–10.5)

## 2017-07-25 MED ORDER — PREDNISONE 20 MG PO TABS
60.0000 mg | ORAL_TABLET | Freq: Once | ORAL | Status: AC
  Administered 2017-07-25: 60 mg via ORAL
  Filled 2017-07-25: qty 3

## 2017-07-25 MED ORDER — PREDNISONE 20 MG PO TABS: ORAL_TABLET | ORAL | 0 refills | Status: DC

## 2017-07-25 NOTE — Telephone Encounter (Signed)
Linus Orn, PT with [facility] called requesting verbal orders for PT and also for an OT consult. Patient is having difficulty completing ADL's.   Call back Is [phone].  Danley Danker, RN [facility] RN)

## 2017-07-25 NOTE — ED Provider Notes (Signed)
[facility] EMERGENCY DEPARTMENT Provider Note   CSN: [id number] Arrival date & time: 07/25/17  1922     History   Chief Complaint Chief Complaint  Patient presents with  . Rash    HPI Dale [name] is a 52 y.o. male.  52 yo M with a chief complaint of a rash.  Patient noticed this to his head and then it spread to his arms and legs and now his lower back.  He has been having some subjective fevers at home as well as a measured temperature of 99.  He is also been nauseated and has been taking Atarax around-the-clock.  He was recently in the hospital for a possible systemic illness though his workup was unremarkable.  He denies any dysuria or pain to the penis.  Denies any pain to the rectum or intraorally.  He describes the rash is itchy.  Has not tried anything topically on it.  Denies any new chemicals, lotions, detergents.  The history is provided by the patient.  Illness  This is a new problem. The current episode started more than 2 days ago. The problem occurs constantly. The problem has been gradually worsening. Pertinent negatives include no chest pain, no abdominal pain, no headaches and no shortness of breath. Nothing aggravates the symptoms. Nothing relieves the symptoms. He has tried nothing for the symptoms. The treatment provided no relief.       Past Medical History:  Diagnosis Date  . Anal fissure   . Anemia of chronic disease   . Arthritis    "hands, right knee" (03/19/2014) no cartlidge in right knee  . CAD (coronary artery disease)    a. Abnl nuc ->LHC (11/15):  mid to dist Dx 80%, mid RCA 99% (functional CTO), dist RCA 80% >> PCI: balloon angioplasty to mid RCA (could not deliver stent) - staged PCI Rotoblator atherectomy/DES to [facility]. b. NSTEMI 12/2015 s/p DES to diagonal.  . Cholecystitis    a. 08/27/2011  . Chronic diastolic CHF (congestive heart failure) ([place])    a. Echo 3/13:  EF 55-60%;  b. Echo (11/15):  Mild LVH, EF 60-65%, no RWMA, Gr  1 DD, MAC, mild LAE, normal RVF, mild RAE;  c.  Echo 5/16:  severe LVH, EF 55-60%, no RWMA, Gr 1 DD, MAC, mild LAE  . Claustrophobia    when things get around his face.   . Complication of anesthesia   . Difficult intubation    only once in 2015 at [facility] haven't had a problem since then  . Diverticulosis   . ESRD (end stage renal disease) ([place])    a. 1995 s/p cadaveric transplant w/ susbequent failure after 18 yrs;  b.Dialysis initiated 07/2011 [facility] M-W-F  . Fibromyalgia   . Gastroparesis   . GERD (gastroesophageal reflux disease)   . Gout    PMH  . History of blood transfusion   . HLD (hyperlipidemia)   . Hypertension   . Hypothyroidism   . Inguinal lymphadenopathy    a. bilateral - s/p biopsy 07/2011  . Insulin dependent diabetes mellitus (HCC)    Type I  . Monilial esophagitis ([place])   . Morbid obesity ([place])   . OSA (obstructive sleep apnea)    adjustable bed  . PAD (peripheral artery disease) (HCC)    a. s/p L BKA.  [name] Pericardial effusion    a.  Small by CT 08/22/11;  b.  Large by CT 08/27/11. c. Not seen on 11/2015  echo.  . Pneumonia ~ 2007  . PONV (postoperative nausea and vomiting)    vomited once after a procedure.  . T1DM (type 1 diabetes mellitus) ([place])   . Tracheobronchomalacia     Patient Active Problem List   Diagnosis Date Noted  . ESRD on hemodialysis ([place])   . Sepsis ([place]) 07/12/2017  . T1DM (type 1 diabetes mellitus) ([place]) 07/12/2017  . Hypotension (arterial) 10/15/2016  . Heme positive stool   . IDDM with complications 23/53/6144  . Oxygen dependent 03/24/2016  . Visit for preventive health examination 03/24/2016  . Chronic diastolic heart failure ([place]) 02/25/2016  . Tracheobronchomalacia 02/04/2016  . Anemia of chronic disease   . Iron deficiency anemia   . Gastrointestinal hemorrhage with melena 12/21/2015  . Pulmonary edema 03/09/2015  . Chronic pain 03/09/2015  . HLD (hyperlipidemia) 11/27/2014  . ESRD on dialysis ([place]) 09/30/2014  .  Coronary artery disease involving native coronary artery of native heart with unstable angina pectoris ([place]) 03/20/2014  . Allergic reaction to dye 03/20/2014  . NSTEMI (non-ST elevated myocardial infarction) ([place]) 03/19/2014  . HTN (hypertension) 02/18/2014  . Abdominal pain 08/27/2011  . Pericardial effusion 08/27/2011  . History of renal transplant 07/22/2011  . S/P BKA (below knee amputation) ([place]) 07/22/2011  . OSA (obstructive sleep apnea) 07/22/2011    Past Surgical History:  Procedure Laterality Date  . A/V FISTULAGRAM Left 07/26/2016   Procedure: A/V Fistulagram;  Surgeon: Waynetta Sandy, MD;  Location: [facility] CV LAB;  Service: Cardiovascular;  Laterality: Left;  . A/V FISTULAGRAM N/A 07/11/2017   Procedure: A/V FISTULAGRAM - Left AV;  Surgeon: Waynetta Sandy, MD;  Location: [facility] CV LAB;  Service: Cardiovascular;  Laterality: N/A;  . AMPUTATION OF REPLICATED TOES Right   . ANGIOPLASTY  04/09/2014   RCA   . APPENDECTOMY  ~ 2006  . ARTERIOVENOUS GRAFT PLACEMENT Left 1993?   forearm  . ARTERIOVENOUS GRAFT PLACEMENT Right 1993?   leg  . ARTERIOVENOUS GRAFT PLACEMENT Left    Thigh  . Arteriovenous Graft Removed Left    Thigh  . AV FISTULA PLACEMENT  07/29/2011   Procedure: ARTERIOVENOUS (AV) FISTULA CREATION;  Surgeon: Mal Misty, MD;  Location: [facility];  Service: Vascular;  Laterality: Right;  Brachial cephalic  . AV FISTULA PLACEMENT Left 01/20/2015   Procedure: LIGATION OF RIGHT ARTERIOVENOUS FISTULA WITH EXCISION OF ANEURYSM;  Surgeon: Angelia Mould, MD;  Location: [facility];  Service: Vascular;  Laterality: Left;  . AV FISTULA PLACEMENT Left 04/30/2015   Procedure: CREATION OF LEFT ARM BRACHIO-CEPHALIC ARTERIOVENOUS (AV) FISTULA ;  Surgeon: Angelia Mould, MD;  Location: [facility];  Service: Vascular;  Laterality: Left;  . AV Graft PLACEMENT Left 1993?   "attempted one in my wrist; didn't take"  . BELOW KNEE LEG AMPUTATION Left 2010  .  CARDIAC CATHETERIZATION  03/19/2014  . CARDIAC CATHETERIZATION  03/19/2014   Procedure: CORONARY BALLOON ANGIOPLASTY;  Surgeon: Burnell Blanks, MD;  Location[facility] LAB;  Service: Cardiovascular;;  . CARDIAC CATHETERIZATION N/A 01/01/2016   Procedure: Left Heart Cath and Coronary Angiography;  Surgeon: Troy Sine, MD;  Location: [facility] CV LAB;  Service: Cardiovascular;  Laterality: N/A;  . CARDIAC CATHETERIZATION N/A 01/01/2016   Procedure: Coronary Stent Intervention;  Surgeon: Troy Sine, MD;  Location: [facility] CV LAB;  Service: Cardiovascular;  Laterality: N/A;  . CARPAL TUNNEL RELEASE Bilateral after 2005  . CATARACT EXTRACTION W/ INTRAOCULAR LENS  IMPLANT, BILATERAL Bilateral  1990's  . COLONOSCOPY  08/29/2011   Procedure: COLONOSCOPY;  Surgeon: Jerene Bears, MD;  Location: [facility];  Service: Gastroenterology;  Laterality: N/A;  . COLONOSCOPY WITH PROPOFOL N/A 04/26/2016   Procedure: COLONOSCOPY WITH PROPOFOL;  Surgeon: Jerene Bears, MD;  Location: [facility];  Service: Gastroenterology;  Laterality: N/A;  . CORONARY ANGIOPLASTY WITH STENT PLACEMENT  04/09/2014       ptca/des mid lad   . ESOPHAGOGASTRODUODENOSCOPY N/A 12/25/2015   Procedure: ESOPHAGOGASTRODUODENOSCOPY (EGD);  Surgeon: Mauri Pole, MD;  Location: [facility] ENDOSCOPY;  Service: Endoscopy;  Laterality: N/A;  bedside  . ESOPHAGOGASTRODUODENOSCOPY (EGD) WITH PROPOFOL N/A 04/26/2016   Procedure: ESOPHAGOGASTRODUODENOSCOPY (EGD) WITH PROPOFOL;  Surgeon: Jerene Bears, MD;  Location: [facility];  Service: Gastroenterology;  Laterality: N/A;  . EYE SURGERY Bilateral    cataract  . FEMORAL ARTERY REPAIR  04/09/2014   ANGIOSEAL  . INSERTION OF DIALYSIS CATHETER  08/01/2011   Procedure: INSERTION OF DIALYSIS CATHETER;  Surgeon: Rosetta Posner, MD;  Location: [facility];  Service: Vascular;  Laterality: Right;  insertion of dialysis catheter on right internal jugular vein  . INSERTION OF DIALYSIS CATHETER Right  01/20/2015   Procedure: INSERTION OF DIALYSIS CATHETER - RIGHT INTERNAL JUGULAR ;  Surgeon: Angelia Mould, MD;  Location: [facility];  Service: Vascular;  Laterality: Right;  . KIDNEY TRANSPLANT  04/25/1993  . LEFT HEART CATHETERIZATION WITH CORONARY ANGIOGRAM N/A 03/19/2014   Procedure: LEFT HEART CATHETERIZATION WITH CORONARY ANGIOGRAM;  Surgeon: Burnell Blanks, MD;  Location: [facility] C[facility] LAB;  Service: Cardiovascular;  Laterality: N/A;  . LYMPH NODE BIOPSY  08/10/2011   Procedure: LYMPH NODE BIOPSY;  Surgeon: Merrie Roof, MD;  Location: [facility];  Service: General;  Laterality: Left;  left groin lymph biopsy  . NEUROPLASTY / TRANSPOSITION ULNAR NERVE AT ELBOW Left after 2005  . PATCH ANGIOPLASTY Left 08/09/2016   Procedure: LEFT  ARTERIOVENOUS FISTULA PATCH ANGIOPLASTY;  Surgeon: Waynetta Sandy, MD;  Location: [facility];  Service: Vascular;  Laterality: Left;  . PERCUTANEOUS CORONARY ROTOBLATOR INTERVENTION (PCI-R) N/A 04/09/2014   Procedure: PERCUTANEOUS CORONARY ROTOBLATOR INTERVENTION (PCI-R);  Surgeon: Burnell Blanks, MD;  Location: [facility] CATH LAB;  Service: Cardiovascular;  Laterality: N/A;  . PERIPHERAL VASCULAR BALLOON ANGIOPLASTY  07/11/2017   Procedure: PERIPHERAL VASCULAR BALLOON ANGIOPLASTY;  Surgeon: Waynetta Sandy, MD;  Location: [facility] CV LAB;  Service: Cardiovascular;;  left arm AV fistula  . PERIPHERAL VASCULAR CATHETERIZATION N/A 12/25/2014   Procedure: Upper Extremity Venography;  Surgeon: Conrad Hemlock, MD;  Location: [facility] CV LAB;  Service: Cardiovascular;  Laterality: N/A;  . PERIPHERAL VASCULAR CATHETERIZATION Left 02/24/2016   Procedure: Fistulagram;  Surgeon: Waynetta Sandy, MD;  Location: [facility] CV LAB;  Service: Cardiovascular;  Laterality: Left;  . PERIPHERAL VASCULAR CATHETERIZATION Left 02/24/2016   Procedure: Peripheral Vascular Intervention;  Surgeon: Waynetta Sandy, MD;  Location: [facility] CV LAB;   Service: Cardiovascular;  Laterality: Left;  AV FISTULA  . PERITONEAL CATHETER INSERTION    . PERITONEAL CATHETER REMOVAL    . REVISON OF ARTERIOVENOUS FISTULA Right 3/00/9233   Procedure: PLICATION / REVISION OF ARTERIOVENOUS FISTULA;  Surgeon: Angelia Mould, MD;  Location: [facility];  Service: Vascular;  Laterality: Right;  . REVISON OF ARTERIOVENOUS FISTULA Left 09/01/2015   Procedure: SUPERFICIALIZATION OF LEFT ARM BRACHIOCEPHALIC ARTERIOVENOUS FISTULA;  Surgeon: Angelia Mould, MD;  Location: [facility];  Service: Vascular;  Laterality: Left;  . REVISON OF ARTERIOVENOUS  FISTULA Left 08/09/2016   Procedure: REVISION OF ARTERIAL ANASTOMOSIS OF ARTERIOVENOUS FISTULA;  Surgeon: Waynetta Sandy, MD;  Location: [facility];  Service: Vascular;  Laterality: Left;  . SHOULDER ARTHROSCOPY Left ~ 2006   frozen  . TOE AMPUTATION Bilateral after 2005  . UMBILICAL HERNIA REPAIR  1990's  . VENOGRAM Right 07/28/2011   Procedure: VENOGRAM;  Surgeon: Conrad Napili-[place]ow[place], MD;  Location: [facility] CATH LAB;  Service: Cardiovascular;  Laterality: Right;  . VITRECTOMY Bilateral         Home Medications    Prior to Admission medications   Medication Sig Start Date End Date Taking? Authorizing Provider  allopurinol (ZYLOPRIM) 100 MG tablet TAKE 2 TABLETS BY MOUTH EVERY DAY Patient taking differently: Take 200 mg by mouth once a day 03/30/17  Yes Wendee Beavers T, MD  aspirin EC 81 MG tablet Take 81 mg by mouth at bedtime.   Yes [provider]  calcium acetate (PHOSLO) 667 MG capsule Take 667-1,334 mg by mouth See admin instructions. Take 1,334 mg by mouth three times a day with meals and 667 mg with each snack 09/19/12  Yes [provider]  cetirizine (ZYRTEC) 10 MG tablet Take 10 mg by mouth daily.   Yes [provider]  diphenhydrAMINE (BENADRYL) 50 MG tablet Take 50 mg by mouth every 6 (six) hours as needed (for itching or allergic reactions).   Yes [provider]  fluticasone  (FLONASE) 50 MCG/ACT nasal spray Place 2 sprays into both nostrils daily as needed for allergies.    Yes [provider]  gabapentin (NEURONTIN) 300 MG capsule Take 1 capsule (300 mg total) by mouth daily. 07/18/17  Yes Sela Hilding, MD  GLUCAGON EMERGENCY 1 MG injection Inject 1 mg into the muscle daily as needed (low blood sugar).  01/19/12  Yes [provider]  HYDROmorphone (DILAUDID) 2 MG tablet Take 2 mg by mouth 2 (two) times daily.    Yes [provider]  hydrOXYzine (ATARAX/VISTARIL) 10 MG tablet Take 10 mg by mouth every 8 (eight) hours as needed for itching or anxiety.    Yes [provider]  insulin lispro (HUMALOG) 100 UNIT/ML injection Per insulin pump   Yes [provider]  levothyroxine (SYNTHROID, LEVOTHROID) 112 MCG tablet Take 112 mcg by mouth every evening. 2 hours after dinner   Yes [provider]  metoprolol tartrate (LOPRESSOR) 25 MG tablet Take 1 tablet (25 mg total) by mouth 2 (two) times daily. Patient taking differently: Take 25 mg by mouth See admin instructions. Take 25 mg by mouth two times a day, but not on the nights prior to dialysis 03/29/17 07/25/17 Yes Burnell Blanks, MD  midodrine (PROAMATINE) 10 MG tablet Take 1 tablet (10 mg total) by mouth 3 (three) times daily. Takes 1 tab in mornings of dialysis and takes 2 tabs 30 mins before starting dialysis (m,w,f) Patient taking differently: Take 10 mg by mouth on the mornings of dialysis (Mon/Wed/Fri) and 20 mg thirty minutes prior to starting dialysis (Mon/Wed/Fri) 04/14/16  Yes Elgergawy, Silver Huguenin, MD  multivitamin (RENA-VIT) TABS tablet Take 1 tablet by mouth daily.   Yes [provider]  Nutritional Supplements (FEEDING SUPPLEMENT, NEPRO CARB STEADY,) LIQD Take 237 mLs by mouth daily. Patient taking differently: Take 237 mLs by mouth every Monday, Wednesday, and Friday with hemodialysis.  01/04/16  Yes Florencia Reasons, MD  pantoprazole (PROTONIX) 40 MG  tablet Take 40 mg by mouth 2 (two) times daily.   Yes  [provider]  Polyethyl Glycol-Propyl Glycol (SYSTANE) 0.4-0.3 % SOLN Place 1 drop into both eyes 3 (three) times daily as needed (for dry eyes).    Yes [provider]  prasugrel (EFFIENT) 10 MG TABS tablet TAKE ONE TABLET BY MOUTH EVERY DAY Patient taking differently: Take 10 mg by mouth once a day 03/09/17  Yes Eileen Stanford, PA-C  rOPINIRole (REQUIP) 2 MG tablet TAKE 1 TABLET(2 MG) BY MOUTH EVERY NIGHT Patient taking differently: Take 2 mg by mouth every night at bedtime 05/05/17  Yes Gonfa, Charlesetta Ivory, MD  testosterone cypionate (DEPOTESTOSTERONE CYPIONATE) 200 MG/ML injection INJECT 200 MG INTO THE MUSCLE EVERY 14 DAYS 09/10/16  Yes [provider]  VELPHORO 500 MG chewable tablet Chew 500-1,000 mg by mouth 3 (three) times daily with meals. Chew 1,000 mg by mouth three times a day with meals and 500 mg with each snack 11/15/16  Yes [provider]  docusate sodium (COLACE) 100 MG capsule Take 200 mg by mouth 2 (two) times daily.     [provider]  nitroGLYCERIN (NITROSTAT) 0.4 MG SL tablet Place 1 tablet (0.4 mg total) under the tongue every 5 (five) minutes as needed for chest pain. Patient not taking: Reported on 07/25/2017 02/15/16   Burnell Blanks, MD  polyethylene glycol [facility] / Floria Raveling) packet Take 17 g by mouth daily. Patient taking differently: Take 17 g by mouth daily as needed for mild constipation.  02/06/16   Regalado, Jerald Kief A, MD  predniSONE (DELTASONE) 20 MG tablet 2 tabs po daily x 4 days 07/25/17   Deno Etienne, DO    Family History Family History  Problem Relation Age of Onset  . Colon polyps Father   . Diabetes Father   . Hypertension Father   . Hypertension Mother   . Diabetes Mother   . Hyperlipidemia Mother   . Breast cancer Maternal Aunt   . Bone cancer Maternal Aunt   . Liver cancer Maternal Aunt   . Malignant hyperthermia Neg Hx   . Heart attack Neg  Hx   . Stroke Neg Hx   . Colon cancer Neg Hx   . Stomach cancer Neg Hx   . Pancreatic cancer Neg Hx   . Esophageal cancer Neg Hx     Social History Social History   Tobacco Use  . Smoking status: Never Smoker  . Smokeless tobacco: Never Used  Substance Use Topics  . Alcohol use: Yes    Comment: occasional  . Drug use: No     Allergies   Iodinated diagnostic agents; Prednisone; Adhesive [tape]; Amoxicillin; Clindamycin/lincomycin; Enalapril; Mobic [meloxicam]; Morphine; Brilinta [ticagrelor]; Ciprofloxacin; Clopidogrel; Codeine; Doxycycline; Hydrocodone; Levofloxacin; Oxycodone; and Rocephin [ceftriaxone]   Review of Systems Review of Systems  Constitutional: Negative for chills and fever.  HENT: Negative for congestion and facial swelling.   Eyes: Negative for discharge and visual disturbance.  Respiratory: Negative for shortness of breath.   Cardiovascular: Negative for chest pain and palpitations.  Gastrointestinal: Positive for nausea and vomiting. Negative for abdominal pain and diarrhea.  Musculoskeletal: Negative for arthralgias and myalgias.  Skin: Positive for rash. Negative for color change.  Neurological: Negative for tremors, syncope and headaches.  Psychiatric/Behavioral: Negative for confusion and dysphoric mood.     Physical Exam Updated Vital Signs BP (!) 135/49 (BP Location: Right Arm)   Pulse 75   Temp 97.7 F (36.5 C) (Oral)   Resp 18   SpO2 96%   Physical Exam  Constitutional: He is  oriented to person, place, and time. He appears well-developed and well-nourished.  HENT:  Head: Normocephalic and atraumatic.  Eyes: Pupils are equal, round, and reactive to light. EOM are normal.  Neck: Normal range of motion. Neck supple. No JVD present.  Cardiovascular: Normal rate and regular rhythm. Exam reveals no gallop and no friction rub.  No murmur heard. Pulmonary/Chest: No respiratory distress. He has no wheezes.  Abdominal: He exhibits no  distension. There is no rebound and no guarding.  Musculoskeletal: Normal range of motion.  Neurological: He is alert and oriented to person, place, and time.  Skin: Rash noted. No pallor.     Psychiatric: He has a normal mood and affect. His behavior is normal.  Nursing note and vitals reviewed.    ED Treatments / Results  Labs (all labs ordered are listed, but only abnormal results are displayed) Labs Reviewed  CBC WITH DIFFERENTIAL/PLATELET - Abnormal; Notable for the following components:      Result Value   RBC 2.93 (*)    Hemoglobin 9.7 (*)    HCT 32.1 (*)    MCV 109.6 (*)    Eosinophils Absolute 1.4 (*)    All other components within normal limits  COMPREHENSIVE METABOLIC PANEL - Abnormal; Notable for the following components:   Chloride 95 (*)    Glucose, Bld 227 (*)    BUN 28 (*)    Creatinine, Ser 9.59 (*)    Calcium 8.7 (*)    Albumin 2.9 (*)    Alkaline Phosphatase 222 (*)    GFR calc non Af Amer 6 (*)    GFR calc Af Amer 6 (*)    All other components within normal limits    EKG None  Radiology No results found.  Procedures Procedures (including critical care time)  Medications Ordered in ED Medications  predniSONE (DELTASONE) tablet 60 mg (60 mg Oral Given 07/25/17 2205)     Initial Impression / Assessment and Plan / ED Course  I have reviewed the triage vital signs and the nursing notes.  Pertinent labs & imaging results that were available during my care of the patient were reviewed by me and considered in my medical decision making (see chart for details).     52 yo M with a chief complaint of a rash.  This is localized to the sun exposed areas.  Nonblanching.  I am unsure of the etiology of this though it does not appear to be systemic as it is on exposed areas.  Patient seems to be in some distress due to the itchiness started on steroids.  I had his primary team during his recent hospitalization evaluate him at bedside and they did not feel  that he met inpatient criteria.  We will have him follow-up in the office.  Given the number for dermatology if he wants to see them for possible biopsy.  10:18 PM:  I have discussed the diagnosis/risks/treatment options with the patient and family and believe the pt to be eligible for discharge home to follow-up with PCP. We also discussed returning to the ED immediately if new or worsening sx occur. We discussed the sx which are most concerning (e.g., sudden worsening pain, fever, inability to tolerate by mouth) that necessitate immediate return. Medications administered to the patient during their visit and any new prescriptions provided to the patient are listed below.  Medications given during this visit Medications  predniSONE (DELTASONE) tablet 60 mg (60 mg Oral Given 07/25/17 2205)  The patient appears reasonably screen and/or stabilized for discharge and I doubt any other medical condition or other [facility] requiring further screening, evaluation, or treatment in the ED at this time prior to discharge.    Final Clinical Impressions(s) / ED Diagnoses   Final diagnoses:  Rash    ED Discharge Orders        Ordered    predniSONE (DELTASONE) 20 MG tablet     07/25/17 2147       Deno Etienne, DO 07/25/17 2218

## 2017-07-25 NOTE — ED Notes (Signed)
esig pad not working. Patient verbalizes understanding of discharge instructions. Opportunity for questioning and answers were provided. Armband removed by staff, pt discharged from ED with wife.

## 2017-07-25 NOTE — Progress Notes (Signed)
Based on what you shared with me it looks like you have a serious condition that should be evaluated in a face to face office visit.  NOTE: If you entered your credit card information for this eVisit, you will not be charged. You may see a "hold" on your card for the $30 but that hold will drop off and you will not have a charge processed.  If you are having a true medical emergency please call 911.  If you need an urgent face to face visit, [facility] has four urgent care centers for your convenience.  If you need care fast and have a high deductible or no insurance consider:   [email or web address] to reserve your spot online an avoid wait times  [facility] [address], [address] [place], [place] [address] 8 am to 8 pm Monday-Friday 10 am to 4 pm Saturday-Sunday *Across the street from [employer]  [facility]  [address] [place], [address] 8 am to 5 pm Monday-Friday * In the [facility] on the [facility]   The following sites will take your  insurance:  . [facility]  [phone] Get Driving Directions Find a Provider at this Location  [address] [place], [place] [address] . 10 am to 8 pm Monday-Friday . 12 pm to 8 pm Saturday-Sunday   . [facility]  [phone] Get Driving Directions Find a Provider at this Location  [address], [address] , [place] [address] . 8 am to 8 pm Monday-Friday . 9 am to 6 pm Saturday . 11 am to 6 pm Sunday   . [facility] [place]  [phone] Get Driving Directions  [address] [place], [place] [address] . 8 am to 8 pm Monday-Friday . 8 am to 4 pm Saturday-Sunday   Your e-visit answers were reviewed by a board certified advanced clinical practitioner to complete your personal care plan.  Thank you for using e-Visits.  

## 2017-07-25 NOTE — ED Triage Notes (Signed)
Pt presents with 4-5 day h/o red, raised painful rash.  Pt reports he was admitted for possible sepsis, discharged with rash beginning when he got home.  Pt reports rash began on head and is now generalized.  Pt reports blurred vision to peripheral fields.  Pt denies any new topicals, soaps, detergents or food or new prescriptions.

## 2017-07-26 DIAGNOSIS — E1022 Type 1 diabetes mellitus with diabetic chronic kidney disease: Secondary | ICD-10-CM | POA: Diagnosis not present

## 2017-07-26 DIAGNOSIS — R52 Pain, unspecified: Secondary | ICD-10-CM | POA: Diagnosis not present

## 2017-07-26 DIAGNOSIS — T82818A Embolism of vascular prosthetic devices, implants and grafts, initial encounter: Secondary | ICD-10-CM | POA: Diagnosis not present

## 2017-07-26 DIAGNOSIS — N186 End stage renal disease: Secondary | ICD-10-CM | POA: Diagnosis not present

## 2017-07-26 DIAGNOSIS — D631 Anemia in chronic kidney disease: Secondary | ICD-10-CM | POA: Diagnosis not present

## 2017-07-26 DIAGNOSIS — N2581 Secondary hyperparathyroidism of renal origin: Secondary | ICD-10-CM | POA: Diagnosis not present

## 2017-07-26 DIAGNOSIS — D509 Iron deficiency anemia, unspecified: Secondary | ICD-10-CM | POA: Diagnosis not present

## 2017-07-26 DIAGNOSIS — L299 Pruritus, unspecified: Secondary | ICD-10-CM | POA: Diagnosis not present

## 2017-07-26 NOTE — Telephone Encounter (Signed)
Left VM for Tracey. PT and OT authorization given.

## 2017-07-27 ENCOUNTER — Other Ambulatory Visit: Payer: Self-pay | Admitting: Family Medicine

## 2017-07-27 ENCOUNTER — Ambulatory Visit: Payer: Medicare Other | Admitting: Family Medicine

## 2017-07-27 ENCOUNTER — Encounter: Payer: Self-pay | Admitting: Family Medicine

## 2017-07-27 ENCOUNTER — Other Ambulatory Visit: Payer: Self-pay

## 2017-07-27 VITALS — BP 130/60 | HR 65 | Temp 97.9°F | Wt 293.6 lb

## 2017-07-27 DIAGNOSIS — K219 Gastro-esophageal reflux disease without esophagitis: Secondary | ICD-10-CM | POA: Diagnosis not present

## 2017-07-27 DIAGNOSIS — R21 Rash and other nonspecific skin eruption: Secondary | ICD-10-CM

## 2017-07-27 DIAGNOSIS — L259 Unspecified contact dermatitis, unspecified cause: Secondary | ICD-10-CM | POA: Insufficient documentation

## 2017-07-27 MED ORDER — PANTOPRAZOLE SODIUM 40 MG PO TBEC: 40.0000 mg | DELAYED_RELEASE_TABLET | Freq: Two times a day (BID) | ORAL | 2 refills | Status: DC

## 2017-07-27 MED ORDER — HYDROXYZINE HCL 10 MG PO TABS: 10.0000 mg | ORAL_TABLET | Freq: Three times a day (TID) | ORAL | 1 refills | Status: DC | PRN

## 2017-07-27 MED ORDER — HYDROCORTISONE 1 % EX OINT: 1.0000 "application " | TOPICAL_OINTMENT | Freq: Two times a day (BID) | CUTANEOUS | 0 refills | Status: DC

## 2017-07-27 NOTE — Patient Instructions (Addendum)
It was great meeting you [name]! I am sorry that you have had such a tough time recently. I believe that you are having likely a combination of a drug allergy and possibly a contact allergy with something in the environment. You can continue taking the atarax and benadryl for itching relief. I gave you a refill for the atarax if you need it. I also gave you a refill for hydrocortisone ointment. Please apply this very generously to the affected areas. I will be helpful for keeping the areas moist and protected from any environmental factors. You can also apply calamine lotion to help with the itching.

## 2017-07-28 DIAGNOSIS — N2581 Secondary hyperparathyroidism of renal origin: Secondary | ICD-10-CM | POA: Diagnosis not present

## 2017-07-28 DIAGNOSIS — D509 Iron deficiency anemia, unspecified: Secondary | ICD-10-CM | POA: Diagnosis not present

## 2017-07-28 DIAGNOSIS — R52 Pain, unspecified: Secondary | ICD-10-CM | POA: Diagnosis not present

## 2017-07-28 DIAGNOSIS — N186 End stage renal disease: Secondary | ICD-10-CM | POA: Diagnosis not present

## 2017-07-28 DIAGNOSIS — E1022 Type 1 diabetes mellitus with diabetic chronic kidney disease: Secondary | ICD-10-CM | POA: Diagnosis not present

## 2017-07-28 DIAGNOSIS — D631 Anemia in chronic kidney disease: Secondary | ICD-10-CM | POA: Diagnosis not present

## 2017-07-28 DIAGNOSIS — L299 Pruritus, unspecified: Secondary | ICD-10-CM | POA: Diagnosis not present

## 2017-07-28 DIAGNOSIS — T82818A Embolism of vascular prosthetic devices, implants and grafts, initial encounter: Secondary | ICD-10-CM | POA: Diagnosis not present

## 2017-07-29 ENCOUNTER — Encounter: Payer: Self-pay | Admitting: Family Medicine

## 2017-07-29 DIAGNOSIS — K219 Gastro-esophageal reflux disease without esophagitis: Secondary | ICD-10-CM | POA: Insufficient documentation

## 2017-07-29 NOTE — Assessment & Plan Note (Signed)
Patient asked for refill for his pantoprazole. Sent refill to his pharmacy.

## 2017-07-29 NOTE — Assessment & Plan Note (Signed)
Unclear if the new onset rash is due to an environmental factor, or is drug induced from his recent hospital stay or has elements of both. Patient potentially exposed to medications he has never had before (aztreaonam). Given that the rash stopped abruptly at areas of skin that were covered this is likely contact dermatitis. Can treat symptoms with benadry, atarax, and hydrocortisone cream. Strict return precautions given.

## 2017-07-29 NOTE — Progress Notes (Signed)
   HPI 52 year old male who presents for presumed allergic reaction. Patient was recently admitted to the hospital from 3/20 to 3/26 for possible sepsis after fistulogram. He was discharged without any obvious source. States that he initially developed a reddish hue and itching to his skin 2 days after getting home. This is a normal reaction to when he gets contrast even through premedication. On 4/1 he developed a different rash. This rash is only present in areas that are in contact with the atmosphere. There are numerous raised lesions, coalescing into larger structures. He states that benadryl and atarax have provided some symptomatic relief. He has not been out in the sun. He is unaware of any new medications he has been taking.   Patient seen 2 days prior to clinic visit when his rash first started. He was given steroids which have not helped him.  CC: allergic reaction   ROS:  Review of Systems See HPI for ROS.   CC, SH/smoking status, and VS noted  Objective: BP 130/60   Pulse 65   Temp 97.9 F (36.6 C) (Oral)   Wt 133.2 kg (293 lb 9.6 oz)   SpO2 90%   BMI 44.64 kg/m  Gen: NAD, alert, cooperative, and pleasant. HEENT: NCAT, EOMI, PERRL CV: RRR, no murmur Resp: CTAB, no wheezes, non-labored Abd: SNTND, BS present, no guarding or organomegaly Ext: No edema, warm. Previous LLE AKA. Neuro: Alert and oriented, Speech clear, No gross deficits Skin: raised, maculopapular rash. All exposed surfaces affected. Excoriations noted. See attached pictures in media   Assessment and plan:  Rash and nonspecific skin eruption Unclear if the new onset rash is due to an environmental factor, or is drug induced from his recent hospital stay or has elements of both. Patient potentially exposed to medications he has never had before (aztreaonam). Given that the rash stopped abruptly at areas of skin that were covered this is likely contact dermatitis. Can treat symptoms with benadry, atarax,  and hydrocortisone cream. Strict return precautions given.  GERD (gastroesophageal reflux disease) Patient asked for refill for his pantoprazole. Sent refill to his pharmacy.   No orders of the defined types were placed in this encounter.   Meds ordered this encounter  Medications  . hydrocortisone 1 % ointment    Sig: Apply 1 application topically 2 (two) times daily.    Dispense:  110 g    Refill:  0  . hydrOXYzine (ATARAX/VISTARIL) 10 MG tablet    Sig: Take 1 tablet (10 mg total) by mouth every 8 (eight) hours as needed for itching or anxiety.    Dispense:  30 tablet    Refill:  1  . pantoprazole (PROTONIX) 40 MG tablet    Sig: Take 1 tablet (40 mg total) by mouth 2 (two) times daily.    Dispense:  30 tablet    Refill:  2     Guadalupe Dawn MD PGY-1 Family Medicine Resident  07/29/2017 2:48 PM

## 2017-07-30 ENCOUNTER — Other Ambulatory Visit: Payer: Self-pay | Admitting: Student

## 2017-07-31 ENCOUNTER — Telehealth: Payer: Self-pay | Admitting: Internal Medicine

## 2017-07-31 DIAGNOSIS — T82818A Embolism of vascular prosthetic devices, implants and grafts, initial encounter: Secondary | ICD-10-CM | POA: Diagnosis not present

## 2017-07-31 DIAGNOSIS — L299 Pruritus, unspecified: Secondary | ICD-10-CM | POA: Diagnosis not present

## 2017-07-31 DIAGNOSIS — R52 Pain, unspecified: Secondary | ICD-10-CM | POA: Diagnosis not present

## 2017-07-31 DIAGNOSIS — N186 End stage renal disease: Secondary | ICD-10-CM | POA: Diagnosis not present

## 2017-07-31 DIAGNOSIS — E1022 Type 1 diabetes mellitus with diabetic chronic kidney disease: Secondary | ICD-10-CM | POA: Diagnosis not present

## 2017-07-31 DIAGNOSIS — D631 Anemia in chronic kidney disease: Secondary | ICD-10-CM | POA: Diagnosis not present

## 2017-07-31 DIAGNOSIS — N2581 Secondary hyperparathyroidism of renal origin: Secondary | ICD-10-CM | POA: Diagnosis not present

## 2017-07-31 DIAGNOSIS — D509 Iron deficiency anemia, unspecified: Secondary | ICD-10-CM | POA: Diagnosis not present

## 2017-07-31 NOTE — Telephone Encounter (Signed)
Spoke with pt and he states he is having terrible reflux. He is taking protonix bid and tums. States he has acid going up his throat into his mouth daily. Reports it is burning his throat and mouth. Pt scheduled to see Ellouise Newer PA tomorrow at 9:45am. Pt aware of appt.

## 2017-08-01 ENCOUNTER — Ambulatory Visit: Payer: Medicare Other | Admitting: Physician Assistant

## 2017-08-02 DIAGNOSIS — D631 Anemia in chronic kidney disease: Secondary | ICD-10-CM | POA: Diagnosis not present

## 2017-08-02 DIAGNOSIS — L299 Pruritus, unspecified: Secondary | ICD-10-CM | POA: Diagnosis not present

## 2017-08-02 DIAGNOSIS — R52 Pain, unspecified: Secondary | ICD-10-CM | POA: Diagnosis not present

## 2017-08-02 DIAGNOSIS — D509 Iron deficiency anemia, unspecified: Secondary | ICD-10-CM | POA: Diagnosis not present

## 2017-08-02 DIAGNOSIS — E1022 Type 1 diabetes mellitus with diabetic chronic kidney disease: Secondary | ICD-10-CM | POA: Diagnosis not present

## 2017-08-02 DIAGNOSIS — N186 End stage renal disease: Secondary | ICD-10-CM | POA: Diagnosis not present

## 2017-08-02 DIAGNOSIS — N2581 Secondary hyperparathyroidism of renal origin: Secondary | ICD-10-CM | POA: Diagnosis not present

## 2017-08-02 DIAGNOSIS — T82818A Embolism of vascular prosthetic devices, implants and grafts, initial encounter: Secondary | ICD-10-CM | POA: Diagnosis not present

## 2017-08-04 DIAGNOSIS — N2581 Secondary hyperparathyroidism of renal origin: Secondary | ICD-10-CM | POA: Diagnosis not present

## 2017-08-04 DIAGNOSIS — D631 Anemia in chronic kidney disease: Secondary | ICD-10-CM | POA: Diagnosis not present

## 2017-08-04 DIAGNOSIS — R52 Pain, unspecified: Secondary | ICD-10-CM | POA: Diagnosis not present

## 2017-08-04 DIAGNOSIS — T82818A Embolism of vascular prosthetic devices, implants and grafts, initial encounter: Secondary | ICD-10-CM | POA: Diagnosis not present

## 2017-08-04 DIAGNOSIS — D509 Iron deficiency anemia, unspecified: Secondary | ICD-10-CM | POA: Diagnosis not present

## 2017-08-04 DIAGNOSIS — N186 End stage renal disease: Secondary | ICD-10-CM | POA: Diagnosis not present

## 2017-08-04 DIAGNOSIS — L299 Pruritus, unspecified: Secondary | ICD-10-CM | POA: Diagnosis not present

## 2017-08-04 DIAGNOSIS — E1022 Type 1 diabetes mellitus with diabetic chronic kidney disease: Secondary | ICD-10-CM | POA: Diagnosis not present

## 2017-08-07 ENCOUNTER — Other Ambulatory Visit: Payer: Self-pay

## 2017-08-07 DIAGNOSIS — N2581 Secondary hyperparathyroidism of renal origin: Secondary | ICD-10-CM | POA: Diagnosis not present

## 2017-08-07 DIAGNOSIS — E1022 Type 1 diabetes mellitus with diabetic chronic kidney disease: Secondary | ICD-10-CM | POA: Diagnosis not present

## 2017-08-07 DIAGNOSIS — L299 Pruritus, unspecified: Secondary | ICD-10-CM | POA: Diagnosis not present

## 2017-08-07 DIAGNOSIS — N186 End stage renal disease: Secondary | ICD-10-CM | POA: Diagnosis not present

## 2017-08-07 DIAGNOSIS — D509 Iron deficiency anemia, unspecified: Secondary | ICD-10-CM | POA: Diagnosis not present

## 2017-08-07 DIAGNOSIS — D631 Anemia in chronic kidney disease: Secondary | ICD-10-CM | POA: Diagnosis not present

## 2017-08-07 DIAGNOSIS — T82818A Embolism of vascular prosthetic devices, implants and grafts, initial encounter: Secondary | ICD-10-CM | POA: Diagnosis not present

## 2017-08-07 DIAGNOSIS — R52 Pain, unspecified: Secondary | ICD-10-CM | POA: Diagnosis not present

## 2017-08-08 ENCOUNTER — Telehealth: Payer: Self-pay

## 2017-08-08 ENCOUNTER — Inpatient Hospital Stay: Payer: Medicare Other | Admitting: Student

## 2017-08-08 DIAGNOSIS — R110 Nausea: Secondary | ICD-10-CM

## 2017-08-08 MED ORDER — PROMETHAZINE HCL 25 MG PO TABS: 12.5000 mg | ORAL_TABLET | Freq: Three times a day (TID) | ORAL | 0 refills | Status: DC | PRN

## 2017-08-08 NOTE — Telephone Encounter (Signed)
Pt requesting rx for promethazine tablets. States the doctor who had been prescribing for him has told him to have [place] fill from now on.  His call back [phone] Wallace Cullens, RN

## 2017-08-08 NOTE — Telephone Encounter (Signed)
Called and talked to patient in regards to his refill request on his promethazine.  Patient was recently hospitalized for nausea and heartburn and found to have elevated LFTs.  He had limited work up which didn't show the etiology. He was evaluated by GI and discharged home.  Patient reports running out of his promethazine.  He started having nausea and dry heaving.  He says Zofran has not worked for him in the past, and asks if we can send in a prescription for Zofran to his pharmacy.  I sent a prescription for promethazine 12.5 mg to 25 mg every 8 hours as needed for nausea/vomiting.  He is on multiple sedating, anticholinergic and antihistamine medications.  I recommended scheduling an appointment for medication review.  I also advised him to bring all his medication bottles to that visit.  Patient voiced understanding and agrees.  He appreciates the call.

## 2017-08-09 ENCOUNTER — Telehealth: Payer: Self-pay | Admitting: Pulmonary Disease

## 2017-08-09 DIAGNOSIS — T82818A Embolism of vascular prosthetic devices, implants and grafts, initial encounter: Secondary | ICD-10-CM | POA: Diagnosis not present

## 2017-08-09 DIAGNOSIS — N186 End stage renal disease: Secondary | ICD-10-CM | POA: Diagnosis not present

## 2017-08-09 DIAGNOSIS — D631 Anemia in chronic kidney disease: Secondary | ICD-10-CM | POA: Diagnosis not present

## 2017-08-09 DIAGNOSIS — L299 Pruritus, unspecified: Secondary | ICD-10-CM | POA: Diagnosis not present

## 2017-08-09 DIAGNOSIS — D509 Iron deficiency anemia, unspecified: Secondary | ICD-10-CM | POA: Diagnosis not present

## 2017-08-09 DIAGNOSIS — N2581 Secondary hyperparathyroidism of renal origin: Secondary | ICD-10-CM | POA: Diagnosis not present

## 2017-08-09 DIAGNOSIS — R52 Pain, unspecified: Secondary | ICD-10-CM | POA: Diagnosis not present

## 2017-08-09 DIAGNOSIS — E1022 Type 1 diabetes mellitus with diabetic chronic kidney disease: Secondary | ICD-10-CM | POA: Diagnosis not present

## 2017-08-09 NOTE — Telephone Encounter (Signed)
Spoke with pt, he states his oxygen levels are dropping and he wanted to come in to be seen sooner. I gave him an appt with Dr. Melvyn Novas tomorrow but advised him that if he got worse over night then go to the ER. Pt understood. Nothing further is needed.

## 2017-08-10 ENCOUNTER — Encounter: Payer: Self-pay | Admitting: Internal Medicine

## 2017-08-10 ENCOUNTER — Telehealth: Payer: Self-pay | Admitting: Internal Medicine

## 2017-08-10 ENCOUNTER — Ambulatory Visit (INDEPENDENT_AMBULATORY_CARE_PROVIDER_SITE_OTHER)
Admission: RE | Admit: 2017-08-10 | Discharge: 2017-08-10 | Disposition: A | Discharge status: Home / Self Care | Payer: Medicare Other | Source: Ambulatory Visit | Attending: Internal Medicine | Admitting: Internal Medicine

## 2017-08-10 ENCOUNTER — Ambulatory Visit: Payer: Medicare Other | Admitting: Internal Medicine

## 2017-08-10 VITALS — BP 140/82 | HR 69 | Ht 68.0 in

## 2017-08-10 DIAGNOSIS — G4733 Obstructive sleep apnea (adult) (pediatric): Secondary | ICD-10-CM | POA: Diagnosis not present

## 2017-08-10 DIAGNOSIS — R05 Cough: Secondary | ICD-10-CM | POA: Diagnosis not present

## 2017-08-10 DIAGNOSIS — R0609 Other forms of dyspnea: Secondary | ICD-10-CM | POA: Diagnosis not present

## 2017-08-10 DIAGNOSIS — R0600 Dyspnea, unspecified: Secondary | ICD-10-CM

## 2017-08-10 DIAGNOSIS — R058 Other specified cough: Secondary | ICD-10-CM

## 2017-08-10 DIAGNOSIS — J9611 Chronic respiratory failure with hypoxia: Secondary | ICD-10-CM | POA: Diagnosis not present

## 2017-08-10 DIAGNOSIS — R0602 Shortness of breath: Secondary | ICD-10-CM | POA: Diagnosis not present

## 2017-08-10 MED ORDER — PANTOPRAZOLE SODIUM 40 MG PO TBEC: 40.0000 mg | DELAYED_RELEASE_TABLET | Freq: Two times a day (BID) | ORAL | 2 refills | Status: DC

## 2017-08-10 NOTE — Telephone Encounter (Signed)
Called patient unable to reach left message to give us a call back.

## 2017-08-10 NOTE — Progress Notes (Signed)
@Patient  ID: Dale [name], male    DOB: 1965/10/26     MRN: [id number]    HPI: 63 yowm MO/ never smoker  with known DM and ESRD on Dialysis and Grade I diastolic dysfunction by Echo 11/01/16 . Seen for pulmonary consult during hospitalization in 12/2015 for acute resp failure and  post op stridor found to have tracheomalacia on CT neck.   TESTS  12/2015 >CT neck , tracheomalacia , highly atelectatic appearance of trachea.  01/29/16 HRCT Chest >very mild patchy GG attenuation, favored for mild interstitial edema. Severe tracheobronchomalacia.   Sleep study 03/02/2016 >Severe OSA (This study showed severe obstructive sleep apnea with an AH of 76 and SaO2 low of 56%. -  rec  trial of BiPAP therapy on 23/19 cm H2O.  06/22/2017 NP Follow up : OSA   he returns for a follow-up.  Patient was seen previously in 2017 for suspected sleep apnea.  He was set up for a sleep study done on November eighth 2017.  This showed severe sleep apnea with an AHI at 76, SaO2 low at 56%.  He was recommended to begin on BiPAP.  Patient says he did get his BiPAP machine but says he could not tolerate this.  He has severe night terrors claustrophobia.  He ended up returning this back to the DME company.  He did not follow-up since 2017.  Patient is on dialysis.  He is in a wheelchair with previous left leg amputation with prosthesis.  Says it is hard for him to get to appointments due to chronic pain and is on daily narcotics.[name] Kitchen rec Set up for CPAP titration study. > changed to split night study and not done as of 08/10/2017  Follow up in 2 months with Dr. Halford Chessman  In [facility]     08/10/2017 acute extended ov/Rilynn Habel re: sob/ desats Chief Complaint  Patient presents with  . Acute Visit    Dropping O2 for 3 weeks. O2 has been in the lower 70s-upper 80s. Productive cough with clear mucus. Denies any fever.   sleeping propped up 30-40 degrees and 3lpm just at hs  One week prior to OV  Noted worse sats and need for daytime 02  when bending over in w/c (he is not ambulatory) assoc with hacking cough but minimal mucoid sputum production   No obvious day to day or daytime variability or assoc excess/ purulent sputum or mucus plugs or hemoptysis or cp or chest tightness, subjective wheeze or overt sinus or hb symptoms. No unusual exposure hx or h/o childhood pna/ asthma or knowledge of premature birth.  Sleeping  HOB elevated on 3lpm   without nocturnal  or early am exacerbation  of respiratory  c/o's or need for noct saba. Also denies any obvious fluctuation of symptoms with weather or environmental changes or other aggravating or alleviating factors except as outlined above   Current Allergies, Complete Past Medical History, Past Surgical History, Family History, and Social History were reviewed in [employer] record.  ROS  The following are not active complaints unless bolded Hoarseness, sore throat, dysphagia, dental problems, itching, sneezing,  nasal congestion or discharge of excess mucus or purulent secretions, ear ache,   fever, chills, sweats, unintended wt loss or wt gain, classically pleuritic or exertional cp,  orthopnea pnd or arm/hand swelling  or leg swelling, presyncope, palpitations, abdominal pain, anorexia, nausea, vomiting, diarrhea  or change in bowel habits or change in bladder habits, change in stools or  change in urine, dysuria, hematuria,  rash, arthralgias, visual complaints, headache, numbness, weakness or ataxia or problems with walking or coordination,  change in mood or  memory.        Current Meds  Medication Sig  . allopurinol (ZYLOPRIM) 100 MG tablet TAKE 2 TABLETS BY MOUTH EVERY DAY (Patient taking differently: Take 200 mg by mouth once a day)  . aspirin EC 81 MG tablet Take 81 mg by mouth at bedtime.  . calcium acetate (PHOSLO) 667 MG capsule Take 667-1,334 mg by mouth See admin instructions. Take 1,334 mg by mouth three times a day with meals and 667 mg with each  snack  . cetirizine (ZYRTEC) 10 MG tablet Take 10 mg by mouth daily.  . diphenhydrAMINE (BENADRYL) 50 MG tablet Take 50 mg by mouth every 6 (six) hours as needed (for itching or allergic reactions).  . docusate sodium (COLACE) 100 MG capsule Take 200 mg by mouth 2 (two) times daily.   . fluticasone (FLONASE) 50 MCG/ACT nasal spray Place 2 sprays into both nostrils daily as needed for allergies.   [name] Kitchen gabapentin (NEURONTIN) 300 MG capsule Take 1 capsule (300 mg total) by mouth daily.  [name] Kitchen GLUCAGON EMERGENCY 1 MG injection Inject 1 mg into the muscle daily as needed (low blood sugar).   . hydrocortisone 1 % ointment Apply 1 application topically 2 (two) times daily.  [name] Kitchen HYDROmorphone (DILAUDID) 2 MG tablet Take 2 mg by mouth 2 (two) times daily.   . hydrOXYzine (ATARAX/VISTARIL) 10 MG tablet Take 1 tablet (10 mg total) by mouth every 8 (eight) hours as needed for itching or anxiety.  . insulin lispro (HUMALOG) 100 UNIT/ML injection Per insulin pump  . levothyroxine (SYNTHROID, LEVOTHROID) 112 MCG tablet Take 112 mcg by mouth every evening. 2 hours after dinner  . midodrine (PROAMATINE) 10 MG tablet Take 1 tablet (10 mg total) by mouth 3 (three) times daily. Takes 1 tab in mornings of dialysis and takes 2 tabs 30 mins before starting dialysis (m,w,f) (Patient taking differently: Take 10 mg by mouth on the mornings of dialysis (Mon/Wed/Fri) and 20 mg thirty minutes prior to starting dialysis (Mon/Wed/Fri))  . multivitamin (RENA-VIT) TABS tablet Take 1 tablet by mouth daily.  . nitroGLYCERIN (NITROSTAT) 0.4 MG SL tablet Place 1 tablet (0.4 mg total) under the tongue every 5 (five) minutes as needed for chest pain.  . Nutritional Supplements (FEEDING SUPPLEMENT, NEPRO CARB STEADY,) LIQD Take 237 mLs by mouth daily. (Patient taking differently: Take 237 mLs by mouth every Monday, Wednesday, and Friday with hemodialysis. )  . pantoprazole (PROTONIX) 40 MG tablet Take 1 tablet (40 mg total) by mouth 2 (two) times  daily before a meal.  . Polyethyl Glycol-Propyl Glycol (SYSTANE) 0.4-0.3 % SOLN Place 1 drop into both eyes 3 (three) times daily as needed (for dry eyes).   . polyethylene glycol (MIRALAX / GLYCOLAX) packet Take 17 g by mouth daily. (Patient taking differently: Take 17 g by mouth daily as needed for mild constipation. )  . prasugrel (EFFIENT) 10 MG TABS tablet TAKE ONE TABLET BY MOUTH EVERY DAY (Patient taking differently: Take 10 mg by mouth once a day)  . rOPINIRole (REQUIP) 2 MG tablet Take 2 mg by mouth every night at bedtime  . testosterone cypionate (DEPOTESTOSTERONE CYPIONATE) 200 MG/ML injection INJECT 200 MG INTO THE MUSCLE EVERY 14 DAYS  . VELPHORO 500 MG chewable tablet Chew 500-1,000 mg by mouth 3 (three) times daily with meals. Chew 1,000 mg by mouth three  times a day with meals and 500 mg with each snack  . [  pantoprazole (PROTONIX) 40 MG tablet Take 1 tablet (40 mg total) by mouth 2 (two) times daily.  .     .            Physical Exam    hoarse obese wm na in w/c  Harsh upper airway coughing fits   Wt Readings from Last 3 Encounters:  07/27/17 293 lb 9.6 oz (133.2 kg)  07/18/17 285 lb 0.9 oz (129.3 kg)  07/11/17 283 lb (128.4 kg)     Vital signs reviewed - Note on arrival 02 sats  95% on RA        HEENT: nl dentition, turbinates bilaterally, and oropharynx with Modified Mallampati Score =  3. Nl external ear canals without cough reflex   NECK :  without JVD/Nodes/TM/ nl carotid upstrokes bilaterally   LUNGS: no acc muscle use,  Nl contour chest with minimal insp crackles bases/ scattered exp rhonchi  With  cough on insp   maneuvers   CV:  RRR  no s3 or murmur or increase in P2, and no edema but wearing tight elastic support R LE and prosthesis L   ABD: tensely obese With very  limited inspiratory excursion . No bruits or organomegaly appreciated, bowel sounds nl  MS:   Upper ext warm without deformities, calf tenderness, cyanosis or clubbing   SKIN: warm  and dry without lesions    NEURO:  alert, approp, nl sensorium with  no motor or cerebellar deficits apparent on w/c exam         CXR PA and Lateral:   08/10/2017 :    I personally reviewed images and   impression as follows:   CM with worsening vasc congestion     Assessment & Plan:

## 2017-08-10 NOTE — Patient Instructions (Addendum)
Pantoprazole (protonix) 40 mg  Take 30- 60 min before your first and last meals of the day   GERD (REFLUX)  is an extremely common cause of respiratory symptoms just like yours , many times with no obvious heartburn at all.    It can be treated with medication, but also with lifestyle changes including elevation of the head of your bed (ideally with 6 inch  bed blocks),  Smoking cessation, avoidance of late meals, excessive alcohol, and avoid fatty foods, chocolate, peppermint, colas, red wine, and acidic juices such as orange juice.  NO MINT OR MENTHOL PRODUCTS SO NO COUGH DROPS  USE SUGARLESS CANDY INSTEAD (Jolley ranchers or Stover's or Life Savers) or even ice chips will also do - the key is to swallow to prevent all throat clearing. NO OIL BASED VITAMINS - use powdered substitutes.    Please see patient coordinator before you leave today  to schedule sleep study and ambulatory 02 titration to see if eligible for POC   Need to pull off as much fluid as you can at dialysis

## 2017-08-11 ENCOUNTER — Encounter: Payer: Self-pay | Admitting: Internal Medicine

## 2017-08-11 DIAGNOSIS — N186 End stage renal disease: Secondary | ICD-10-CM | POA: Diagnosis not present

## 2017-08-11 DIAGNOSIS — N2581 Secondary hyperparathyroidism of renal origin: Secondary | ICD-10-CM | POA: Diagnosis not present

## 2017-08-11 DIAGNOSIS — E1022 Type 1 diabetes mellitus with diabetic chronic kidney disease: Secondary | ICD-10-CM | POA: Diagnosis not present

## 2017-08-11 DIAGNOSIS — D509 Iron deficiency anemia, unspecified: Secondary | ICD-10-CM | POA: Diagnosis not present

## 2017-08-11 DIAGNOSIS — T82818A Embolism of vascular prosthetic devices, implants and grafts, initial encounter: Secondary | ICD-10-CM | POA: Diagnosis not present

## 2017-08-11 DIAGNOSIS — R05 Cough: Secondary | ICD-10-CM | POA: Insufficient documentation

## 2017-08-11 DIAGNOSIS — L299 Pruritus, unspecified: Secondary | ICD-10-CM | POA: Diagnosis not present

## 2017-08-11 DIAGNOSIS — D631 Anemia in chronic kidney disease: Secondary | ICD-10-CM | POA: Diagnosis not present

## 2017-08-11 DIAGNOSIS — R058 Other specified cough: Secondary | ICD-10-CM | POA: Insufficient documentation

## 2017-08-11 DIAGNOSIS — R52 Pain, unspecified: Secondary | ICD-10-CM | POA: Diagnosis not present

## 2017-08-11 NOTE — Assessment & Plan Note (Signed)
Harsh upper airway cough on inps is typical of Upper airway cough syndrome (previously labeled PNDS),  is so named because it's frequently impossible to sort out how much is  CR/sinusitis with freq throat clearing (which can be related to primary GERD)   vs  causing  secondary (" extra esophageal")  GERD from wide swings in gastric pressure that occur with throat clearing, often  promoting self use of mint and menthol lozenges that reduce the lower esophageal sphincter tone and exacerbate the problem further in a cyclical fashion.   These are the same pts (now being labeled as having "irritable larynx syndrome" by some cough centers) who not infrequently have a history of having failed to tolerate ace inhibitors,  dry powder inhalers or biphosphonates or report having atypical/extraesophageal reflux symptoms that don't respond to standard doses of PPI  and are easily confused as having aecopd or asthma flares by even experienced allergists/ pulmonologists (myself included).   rec max rx for GERD with ppi/ diet

## 2017-08-11 NOTE — Assessment & Plan Note (Signed)
Sleep study 02/2016 >Severe OSA (This study showed severe obstructive sleep apnea with an AH of 76 and SaO2 low of 56%. - This was a difficult titration portion of the study.  He continued to have respiratory events even with high CPAP settings.  He was transitioned to Bipap, and had reasonable control of his sleep apnea with Bipap 23/19 cm H2O.  He was observed in REM sleep at this pressure setting.  He did not require supplemental oxygen once he was on this Bipap setting. - He had a increase in his periodic limb movement index.) >He should be started on trial of BiPAP therapy on 23/19 cm H2O. - He was fitted with a Small size [employer] Full Face Mask Simplus mask and heated humidification.  - 08/10/2017 referred for split night study again as did not qualify for cpap titration study

## 2017-08-11 NOTE — Assessment & Plan Note (Signed)
Body mass index is 44.64 kg/m.  -  Trending up  Lab Results  Component Value Date   TSH 1.517 07/13/2017     Contributing to gerd risk/ doe/reviewed the need and the process to achieve and maintain neg calorie balance > defer f/u primary care including intermittently monitoring thyroid status

## 2017-08-11 NOTE — Assessment & Plan Note (Signed)
08/10/2017 rec  ambulatory 02 titration to see if eligible for POC   Explained criteria for ambulatory 02 may not be met here and need to treat the underlying problems : vol overload/diastolic dysfunction / cri/ obesity to the extent we can to eliminate need for daytime 02

## 2017-08-11 NOTE — Assessment & Plan Note (Addendum)
Echo 11/01/16 LVEF 60-65%, moderate LVH, normal wall motion, grade 1 DD with   elevated LV filling pressure, trivial MR, mild TR, RVSP 39 mmHg,   normal IVC.  Multifactorial but mostly related to obesity , diastolic dysfunction and CRI and need to pull more fluid off at HD planned for 08/11/17/ advised - in meantime can use 02 more consistently at home and be considered later for prn "amb" 02 though may not meet the criteria/ advised  (see separate a/p)   No evidence of asthma cardiac or otherwise/ nor pna here.   I had an extended discussion with the patient/wife  reviewing all relevant studies (pt previous not known to me)  completed to date   re  severe non-specific but potentially very serious refractory respiratory symptoms of uncertain and potentially multiple  etiologies.   Each maintenance medication was reviewed in detail including most importantly the difference between maintenance and prns and under what circumstances the prns are to be triggered using an action plan format that is not reflected in the computer generated alphabetically organized AVS.    Please see AVS for specific instructions unique to this office visit that I personally wrote and verbalized to the the pt in detail and then reviewed with pt  by my nurse highlighting any changes in therapy/plan of care  recommended at today's visit.    > 50 % ov spent in face to face time

## 2017-08-14 DIAGNOSIS — D509 Iron deficiency anemia, unspecified: Secondary | ICD-10-CM | POA: Diagnosis not present

## 2017-08-14 DIAGNOSIS — T82818A Embolism of vascular prosthetic devices, implants and grafts, initial encounter: Secondary | ICD-10-CM | POA: Diagnosis not present

## 2017-08-14 DIAGNOSIS — D631 Anemia in chronic kidney disease: Secondary | ICD-10-CM | POA: Diagnosis not present

## 2017-08-14 DIAGNOSIS — R52 Pain, unspecified: Secondary | ICD-10-CM | POA: Diagnosis not present

## 2017-08-14 DIAGNOSIS — N2581 Secondary hyperparathyroidism of renal origin: Secondary | ICD-10-CM | POA: Diagnosis not present

## 2017-08-14 DIAGNOSIS — L299 Pruritus, unspecified: Secondary | ICD-10-CM | POA: Diagnosis not present

## 2017-08-14 DIAGNOSIS — N186 End stage renal disease: Secondary | ICD-10-CM | POA: Diagnosis not present

## 2017-08-14 DIAGNOSIS — E1022 Type 1 diabetes mellitus with diabetic chronic kidney disease: Secondary | ICD-10-CM | POA: Diagnosis not present

## 2017-08-14 NOTE — Telephone Encounter (Signed)
Noted  

## 2017-08-15 ENCOUNTER — Ambulatory Visit: Payer: Medicare Other | Admitting: Student

## 2017-08-16 DIAGNOSIS — E1022 Type 1 diabetes mellitus with diabetic chronic kidney disease: Secondary | ICD-10-CM | POA: Diagnosis not present

## 2017-08-16 DIAGNOSIS — N2581 Secondary hyperparathyroidism of renal origin: Secondary | ICD-10-CM | POA: Diagnosis not present

## 2017-08-16 DIAGNOSIS — T82818A Embolism of vascular prosthetic devices, implants and grafts, initial encounter: Secondary | ICD-10-CM | POA: Diagnosis not present

## 2017-08-16 DIAGNOSIS — N186 End stage renal disease: Secondary | ICD-10-CM | POA: Diagnosis not present

## 2017-08-16 DIAGNOSIS — R52 Pain, unspecified: Secondary | ICD-10-CM | POA: Diagnosis not present

## 2017-08-16 DIAGNOSIS — L299 Pruritus, unspecified: Secondary | ICD-10-CM | POA: Diagnosis not present

## 2017-08-16 DIAGNOSIS — D631 Anemia in chronic kidney disease: Secondary | ICD-10-CM | POA: Diagnosis not present

## 2017-08-16 DIAGNOSIS — D509 Iron deficiency anemia, unspecified: Secondary | ICD-10-CM | POA: Diagnosis not present

## 2017-08-17 ENCOUNTER — Ambulatory Visit: Payer: Medicare Other | Admitting: Pulmonary Disease

## 2017-08-18 DIAGNOSIS — D631 Anemia in chronic kidney disease: Secondary | ICD-10-CM | POA: Diagnosis not present

## 2017-08-18 DIAGNOSIS — E1022 Type 1 diabetes mellitus with diabetic chronic kidney disease: Secondary | ICD-10-CM | POA: Diagnosis not present

## 2017-08-18 DIAGNOSIS — R52 Pain, unspecified: Secondary | ICD-10-CM | POA: Diagnosis not present

## 2017-08-18 DIAGNOSIS — T82818A Embolism of vascular prosthetic devices, implants and grafts, initial encounter: Secondary | ICD-10-CM | POA: Diagnosis not present

## 2017-08-18 DIAGNOSIS — D509 Iron deficiency anemia, unspecified: Secondary | ICD-10-CM | POA: Diagnosis not present

## 2017-08-18 DIAGNOSIS — N2581 Secondary hyperparathyroidism of renal origin: Secondary | ICD-10-CM | POA: Diagnosis not present

## 2017-08-18 DIAGNOSIS — N186 End stage renal disease: Secondary | ICD-10-CM | POA: Diagnosis not present

## 2017-08-18 DIAGNOSIS — L299 Pruritus, unspecified: Secondary | ICD-10-CM | POA: Diagnosis not present

## 2017-08-21 DIAGNOSIS — T82818A Embolism of vascular prosthetic devices, implants and grafts, initial encounter: Secondary | ICD-10-CM | POA: Diagnosis not present

## 2017-08-21 DIAGNOSIS — D509 Iron deficiency anemia, unspecified: Secondary | ICD-10-CM | POA: Diagnosis not present

## 2017-08-21 DIAGNOSIS — N2581 Secondary hyperparathyroidism of renal origin: Secondary | ICD-10-CM | POA: Diagnosis not present

## 2017-08-21 DIAGNOSIS — L299 Pruritus, unspecified: Secondary | ICD-10-CM | POA: Diagnosis not present

## 2017-08-21 DIAGNOSIS — E1022 Type 1 diabetes mellitus with diabetic chronic kidney disease: Secondary | ICD-10-CM | POA: Diagnosis not present

## 2017-08-21 DIAGNOSIS — R52 Pain, unspecified: Secondary | ICD-10-CM | POA: Diagnosis not present

## 2017-08-21 DIAGNOSIS — N186 End stage renal disease: Secondary | ICD-10-CM | POA: Diagnosis not present

## 2017-08-21 DIAGNOSIS — D631 Anemia in chronic kidney disease: Secondary | ICD-10-CM | POA: Diagnosis not present

## 2017-08-22 ENCOUNTER — Ambulatory Visit: Payer: Medicare Other | Admitting: Family Medicine

## 2017-08-25 ENCOUNTER — Encounter: Payer: Medicare Other | Admitting: Vascular Surgery

## 2017-08-25 ENCOUNTER — Encounter (HOSPITAL_COMMUNITY): Payer: Medicare Other

## 2017-08-28 ENCOUNTER — Telehealth: Payer: Self-pay | Admitting: Internal Medicine

## 2017-08-28 NOTE — Telephone Encounter (Signed)
Called and spoke with patient. Patient was never walked or qualified for POC. Patient has been scheduled for walk test.

## 2017-08-29 DIAGNOSIS — Z79899 Other long term (current) drug therapy: Secondary | ICD-10-CM | POA: Diagnosis not present

## 2017-08-29 DIAGNOSIS — M2550 Pain in unspecified joint: Secondary | ICD-10-CM | POA: Diagnosis not present

## 2017-08-31 ENCOUNTER — Telehealth: Payer: Self-pay | Admitting: Internal Medicine

## 2017-08-31 ENCOUNTER — Ambulatory Visit: Payer: Medicare Other | Admitting: *Deleted

## 2017-08-31 DIAGNOSIS — J9611 Chronic respiratory failure with hypoxia: Secondary | ICD-10-CM

## 2017-08-31 NOTE — Telephone Encounter (Signed)
lmtcb x 1 for Willis with  [facility]

## 2017-08-31 NOTE — Progress Notes (Signed)
Pt qualified for [place] today Parke Poisson, [facility] 08/31/17

## 2017-09-01 DIAGNOSIS — E1059 Type 1 diabetes mellitus with other circulatory complications: Secondary | ICD-10-CM | POA: Diagnosis not present

## 2017-09-01 DIAGNOSIS — Z794 Long term (current) use of insulin: Secondary | ICD-10-CM | POA: Diagnosis not present

## 2017-09-01 DIAGNOSIS — E1040 Type 1 diabetes mellitus with diabetic neuropathy, unspecified: Secondary | ICD-10-CM | POA: Diagnosis not present

## 2017-09-01 DIAGNOSIS — E108 Type 1 diabetes mellitus with unspecified complications: Secondary | ICD-10-CM | POA: Diagnosis not present

## 2017-09-01 NOTE — Telephone Encounter (Signed)
Spoke with Dale [name], he states pt purchased his own tanks and doesn't use their oxygen but he can supply him with a simply gomin. He just needs an order on the template. Order placed. Nothing further is needed.

## 2017-09-01 NOTE — Telephone Encounter (Signed)
Jason/[facility] returned call.

## 2017-09-05 ENCOUNTER — Telehealth: Payer: Self-pay | Admitting: Internal Medicine

## 2017-09-05 DIAGNOSIS — E103551 Type 1 diabetes mellitus with stable proliferative diabetic retinopathy, right eye: Secondary | ICD-10-CM | POA: Diagnosis not present

## 2017-09-05 DIAGNOSIS — E103532 Type 1 diabetes mellitus with proliferative diabetic retinopathy with traction retinal detachment not involving the macula, left eye: Secondary | ICD-10-CM | POA: Diagnosis not present

## 2017-09-05 DIAGNOSIS — Z794 Long term (current) use of insulin: Secondary | ICD-10-CM | POA: Diagnosis not present

## 2017-09-05 NOTE — Telephone Encounter (Signed)
Spoke with Dale [name] at [facility], had his O2 equipment picked up at pt request d/t the POC being too loud.  Pt is now trying to get O2 again through [facility], but pt does not want to pick up the stationary and portable set d/t cost, wants to only pick up a Simply Go Mini.  [facility] cannot provide only a Simply Go Mini d/t [facility] losing money.   Pt can either pick up tanks through [facility] or can try to get a POC through another company.  MW please advise if you're ok with pt trying to switch DME companies.

## 2017-09-05 NOTE — Telephone Encounter (Signed)
I would advise him to work with [facility] for whatever they are willing to provide him then when return here if not satisfied we can requalify him for the purpose of switching companies if he's not happy at that point but not enough info from last ov to do a whole new referral for 02

## 2017-09-05 NOTE — Telephone Encounter (Signed)
lmtcb to discuss with pt

## 2017-09-06 ENCOUNTER — Encounter (HOSPITAL_BASED_OUTPATIENT_CLINIC_OR_DEPARTMENT_OTHER): Payer: Medicare Other

## 2017-09-06 NOTE — Telephone Encounter (Signed)
lmtcb X2 for pt.  

## 2017-09-07 NOTE — Telephone Encounter (Signed)
Attempted to call pt. I did not receive an answer. I have left a message for pt to return our call.  

## 2017-09-08 ENCOUNTER — Telehealth: Payer: Self-pay | Admitting: Internal Medicine

## 2017-09-08 DIAGNOSIS — E1022 Type 1 diabetes mellitus with diabetic chronic kidney disease: Secondary | ICD-10-CM | POA: Diagnosis not present

## 2017-09-08 DIAGNOSIS — L299 Pruritus, unspecified: Secondary | ICD-10-CM | POA: Diagnosis not present

## 2017-09-08 DIAGNOSIS — N186 End stage renal disease: Secondary | ICD-10-CM | POA: Diagnosis not present

## 2017-09-08 DIAGNOSIS — T82818A Embolism of vascular prosthetic devices, implants and grafts, initial encounter: Secondary | ICD-10-CM | POA: Diagnosis not present

## 2017-09-08 DIAGNOSIS — N2581 Secondary hyperparathyroidism of renal origin: Secondary | ICD-10-CM | POA: Diagnosis not present

## 2017-09-08 DIAGNOSIS — R52 Pain, unspecified: Secondary | ICD-10-CM | POA: Diagnosis not present

## 2017-09-08 DIAGNOSIS — D631 Anemia in chronic kidney disease: Secondary | ICD-10-CM | POA: Diagnosis not present

## 2017-09-08 NOTE — Telephone Encounter (Signed)
Called and spoke with patient, he states that he had his equipment picked up due to it being too hot and too loud. He then went out and picked up his own concentrator due to [facility] not being able to provide him one that one quite.   Patient states that he now just wants a simply go mini to take out with him when he is not at home for o2 use. I advise patient from previous message that per [facility] from [facility] they will not be able to provide only the simply go mini. I advised patient that per MW response he should work with [facility] to see what they could offer and if at his next appointment he is not satisfied then we could go from there.   Patient got really upset on the phone and stated that he would figure it out on his own. Patient hung up, when I tried to call back patient stated that he did not want to talk to anyone at the office.

## 2017-09-08 NOTE — Telephone Encounter (Signed)
ATC pt, no answer. Left message for pt to call back.  This is the 4th attempt to reach pt, per protocol I will close this encounter.

## 2017-09-11 DIAGNOSIS — N186 End stage renal disease: Secondary | ICD-10-CM | POA: Diagnosis not present

## 2017-09-11 DIAGNOSIS — E1022 Type 1 diabetes mellitus with diabetic chronic kidney disease: Secondary | ICD-10-CM | POA: Diagnosis not present

## 2017-09-11 DIAGNOSIS — D631 Anemia in chronic kidney disease: Secondary | ICD-10-CM | POA: Diagnosis not present

## 2017-09-11 DIAGNOSIS — L299 Pruritus, unspecified: Secondary | ICD-10-CM | POA: Diagnosis not present

## 2017-09-11 DIAGNOSIS — T82818A Embolism of vascular prosthetic devices, implants and grafts, initial encounter: Secondary | ICD-10-CM | POA: Diagnosis not present

## 2017-09-11 DIAGNOSIS — N2581 Secondary hyperparathyroidism of renal origin: Secondary | ICD-10-CM | POA: Diagnosis not present

## 2017-09-11 DIAGNOSIS — R52 Pain, unspecified: Secondary | ICD-10-CM | POA: Diagnosis not present

## 2017-09-12 ENCOUNTER — Telehealth: Payer: Self-pay | Admitting: Physician Assistant

## 2017-09-12 ENCOUNTER — Telehealth: Payer: Self-pay | Admitting: Emergency Medicine

## 2017-09-12 ENCOUNTER — Encounter: Payer: Self-pay | Admitting: Physician Assistant

## 2017-09-12 ENCOUNTER — Ambulatory Visit: Payer: Medicare Other | Admitting: Physician Assistant

## 2017-09-12 VITALS — BP 132/82 | HR 71 | Ht 68.0 in | Wt 279.0 lb

## 2017-09-12 DIAGNOSIS — R110 Nausea: Secondary | ICD-10-CM

## 2017-09-12 DIAGNOSIS — R059 Cough, unspecified: Secondary | ICD-10-CM

## 2017-09-12 DIAGNOSIS — R1310 Dysphagia, unspecified: Secondary | ICD-10-CM | POA: Diagnosis not present

## 2017-09-12 DIAGNOSIS — K219 Gastro-esophageal reflux disease without esophagitis: Secondary | ICD-10-CM

## 2017-09-12 DIAGNOSIS — R05 Cough: Secondary | ICD-10-CM

## 2017-09-12 MED ORDER — DEXLANSOPRAZOLE 60 MG PO CPDR: 60.0000 mg | DELAYED_RELEASE_CAPSULE | Freq: Every day | ORAL | 5 refills | Status: DC

## 2017-09-12 MED ORDER — AMBULATORY NON FORMULARY MEDICATION: 0 refills | Status: DC

## 2017-09-12 NOTE — Patient Instructions (Addendum)
We have sent the following medications to your pharmacy for you to pick up at your convenience:  Dexilant 60 mg daily  Gi cocktail 5-10 mL every 4-6 hours as needed for pain   You have been scheduled for a Barium Esophogram at [facility] (1st floor of the hospital) on 09-20-17 at 9:30 am. Please arrive 15 minutes prior to your appointment for registration. Make certain not to have anything to eat or drink 6 hours prior to your test. If you need to reschedule for any reason, please contact radiology at [phone] to do so. __________________________________________________________________ A barium swallow is an examination that concentrates on views of the esophagus. This tends to be a double contrast exam (barium and two liquids which, when combined, create a gas to distend the wall of the oesophagus) or single contrast (non-ionic iodine based). The study is usually tailored to your symptoms so a good history is essential. Attention is paid during the study to the form, structure and configuration of the esophagus, looking for functional disorders (such as aspiration, dysphagia, achalasia, motility and reflux) EXAMINATION You may be asked to change into a gown, depending on the type of swallow being performed. A radiologist and radiographer will perform the procedure. The radiologist will advise you of the type of contrast selected for your procedure and direct you during the exam. You will be asked to stand, sit or lie in several different positions and to hold a small amount of fluid in your mouth before being asked to swallow while the imaging is performed .In some instances you may be asked to swallow barium coated marshmallows to assess the motility of a solid food bolus. The exam can be recorded as a digital or video fluoroscopy procedure. POST PROCEDURE It will take 1-2 days for the barium to pass through your system. To facilitate this, it is important, unless otherwise directed,  to increase your fluids for the next 24-48hrs and to resume your normal diet.  This test typically takes about 30 minutes to perform. __________________________________________________________________________________ Dale [name] have been scheduled for an Upper GI Series and Small Bowel Follow Thru at [facility]. Your appointment is on 09-20-17 at 9:30 am. Please arrive 15 minutes prior to your test for registration. Make certain not to have anything to eat or drink after midnight on the night before your test. If you need to reschedule, please contact radiology at [phone]. --------------------------------------------------------------------------------------------------------------- An upper GI series uses x rays to help diagnose problems of the upper GI tract, which includes the esophagus, stomach, and duodenum. The duodenum is the first part of the small intestine. An upper GI series is conducted by a radiology technologist or a radiologist-a doctor who specializes in x-ray imaging-at a hospital or outpatient center. While sitting or standing in front of an x-ray machine, the patient drinks barium liquid, which is often white and has a chalky consistency and taste. The barium liquid coats the lining of the upper GI tract and makes signs of disease show up more clearly on x rays. X-ray video, called fluoroscopy, is used to view the barium liquid moving through the esophagus, stomach, and duodenum. Additional x rays and fluoroscopy are performed while the patient lies on an x-ray table. To fully coat the upper GI tract with barium liquid, the technologist or radiologist may press on the abdomen or ask the patient to change position. Patients hold still in various positions, allowing the technologist or radiologist to take x rays of the upper GI tract at different  angles. If a technologist conducts the upper GI series, a radiologist will later examine the images to look for problems.  This test typically  takes about 1 hour to complete --------------------------------------------------------------------------------------------------------------------------------------------  Important: Drink plenty of water (8-10 cups/day) for a few days following the procedure to avoid constipation and blockage. The barium will make your stools white for a few days. --------------------------------------------------------------------------------------------------------------------------------------------

## 2017-09-12 NOTE — Progress Notes (Addendum)
Chief Complaint: GERD, cough  HPI:    [name] is a 52 year old male with a complicated past medical history as below including end-stage renal disease with previous renal transplant, now back on dialysis after failure, coronary artery disease with drug-eluting stents placed in September 2017 requiring Effient (last echo 11/01/2016 EF 60-65%), diabetes, oxygen dependent at nighttime, issues with previous difficult intubations and left BKA with prosthesis, who followed with Dr. Hilarie [name] in the past and who was referred to me by Dale Riding, MD for a complaint of reflux and cough.      03/24/2016 last OV to discuss anemia.  EGD and colonoscopy repeated.    04/26/2016 colonoscopy and EGD.  Colonoscopy with mild diverticulosis in the sigmoid, descending colon and hepatic flexure with no evidence of diverticular bleeding.  Otherwise normal.  Repeat recommended 10 years.  EGD with monilial esophagitis mild.  Patient placed on fluconazole 200 mg x 1 and 100 mg x 13 days.    07/15/2017 consulted in the hospital for elevated LFTs.    Today, presents to clinic accompanied by his wife, explains that his Omeprazole was changed to Pantoprazole during recent hospitalization.  He has been taking Pantoprazole 40 mg twice daily as well as to Zantac 150 mg in the morning and 2 at night and continues with daily reflux describing a "burning in my throat and acid in my mouth".  Also describes a cough.  When he brushes his teeth in the morning all this is worse.  Associated symptoms include some choking sensation when he is eating as well as nausea.  Patient does sleep sitting up in a chair.    Also describes increased amount of fluid in his lungs and they are trying to "take off some of my weight at dialysis".  He is having to use his oxygen at home, sometimes during coughing fits.    Does describe more consistent bowel movements with the use of 2 stool softeners twice daily.    Denies fever, chills, blood in his stool or  symptoms that awaken him at night.  Past Medical History:  Diagnosis Date  . Anal fissure   . Anemia of chronic disease   . Arthritis    "hands, right knee" (03/19/2014) no cartlidge in right knee  . CAD (coronary artery disease)    a. Abnl nuc ->LHC (11/15):  mid to dist Dx 80%, mid RCA 99% (functional CTO), dist RCA 80% >> PCI: balloon angioplasty to mid RCA (could not deliver stent) - staged PCI Rotoblator atherectomy/DES to [facility]. b. NSTEMI 12/2015 s/p DES to diagonal.  . Cholecystitis    a. 08/27/2011  . Chronic diastolic CHF (congestive heart failure) ([place])    a. Echo 3/13:  EF 55-60%;  b. Echo (11/15):  Mild LVH, EF 60-65%, no RWMA, Gr 1 DD, MAC, mild LAE, normal RVF, mild RAE;  c.  Echo 5/16:  severe LVH, EF 55-60%, no RWMA, Gr 1 DD, MAC, mild LAE  . Claustrophobia    when things get around his face.   . Complication of anesthesia   . Difficult intubation    only once in 2015 at [facility] haven't had a problem since then  . Diverticulosis   . ESRD (end stage renal disease) ([place])    a. 1995 s/p cadaveric transplant w/ susbequent failure after 18 yrs;  b.Dialysis initiated 07/2011 [facility] M-W-F  . Fibromyalgia   . Gastroparesis   . GERD (gastroesophageal reflux disease)   . Gout  PMH  . History of blood transfusion   . HLD (hyperlipidemia)   . Hypertension   . Hypothyroidism   . Inguinal lymphadenopathy    a. bilateral - s/p biopsy 07/2011  . Insulin dependent diabetes mellitus (HCC)    Type I  . Monilial esophagitis ([place])   . Morbid obesity ([place])   . OSA (obstructive sleep apnea)    adjustable bed  . PAD (peripheral artery disease) (HCC)    a. s/p L BKA.  [name] Kitchen Pericardial effusion    a.  Small by CT 08/22/11;  b.  Large by CT 08/27/11. c. Not seen on 11/2015 echo.  . Pneumonia ~ 2007  . PONV (postoperative nausea and vomiting)    vomited once after a procedure.  . T1DM (type 1 diabetes mellitus) ([place])   . Tracheobronchomalacia     Past Surgical History:  Procedure  Laterality Date  . A/V FISTULAGRAM Left 07/26/2016   Procedure: A/V Fistulagram;  Surgeon: Waynetta Sandy, MD;  Location: [place] [facility];  Service: Cardiovascular;  Laterality: Left;  . A/V FISTULAGRAM N/A 07/11/2017   Procedure: A/V FISTULAGRAM - Left AV;  Surgeon: Waynetta Sandy, MD;  Location: [place] [facility];  Service: Cardiovascular;  Laterality: N/A;  . AMPUTATION OF REPLICATED TOES Right   . ANGIOPLASTY  04/09/2014   RCA   . APPENDECTOMY  ~ 2006  . ARTERIOVENOUS GRAFT PLACEMENT Left 1993?   forearm  . ARTERIOVENOUS GRAFT PLACEMENT Right 1993?   leg  . ARTERIOVENOUS GRAFT PLACEMENT Left    Thigh  . Arteriovenous Graft Removed Left    Thigh  . AV FISTULA PLACEMENT  07/29/2011   Procedure: ARTERIOVENOUS (AV) FISTULA CREATION;  Surgeon: Mal Misty, MD;  Location: [place];  Service: Vascular;  Laterality: Right;  Brachial cephalic  . AV FISTULA PLACEMENT Left 01/20/2015   Procedure: LIGATION OF RIGHT ARTERIOVENOUS FISTULA WITH EXCISION OF ANEURYSM;  Surgeon: Angelia Mould, MD;  Location: [place];  Service: Vascular;  Laterality: Left;  . AV FISTULA PLACEMENT Left 04/30/2015   Procedure: CREATION OF LEFT ARM BRACHIO-CEPHALIC ARTERIOVENOUS (AV) FISTULA ;  Surgeon: Angelia Mould, MD;  Location: [place];  Service: Vascular;  Laterality: Left;  . AV Graft PLACEMENT Left 1993?   "attempted one in my wrist; didn't take"  . BELOW KNEE LEG AMPUTATION Left 2010  . CARDIAC CATHETERIZATION  03/19/2014  . CARDIAC CATHETERIZATION  03/19/2014   Procedure: CORONARY BALLOON ANGIOPLASTY;  Surgeon: Burnell Blanks, MD;  Location: [facility] CATH LAB;  Service: Cardiovascular;;  . CARDIAC CATHETERIZATION N/A 01/01/2016   Procedure: Left Heart Cath and Coronary Angiography;  Surgeon: Troy Sine, MD;  Location: [place] CV LAB;  Service: Cardiovascular;  Laterality: N/A;  . CARDIAC CATHETERIZATION N/A 01/01/2016   Procedure: Coronary Stent Intervention;  Surgeon:  Troy Sine, MD;  Location: [place] [facility];  Service: Cardiovascular;  Laterality: N/A;  . CARPAL TUNNEL RELEASE Bilateral after 2005  . CATARACT EXTRACTION W/ INTRAOCULAR LENS  IMPLANT, BILATERAL Bilateral 1990's  . COLONOSCOPY  08/29/2011   Procedure: COLONOSCOPY;  Surgeon: Jerene Bears, MD;  Location: [place];  Service: Gastroenterology;  Laterality: N/A;  . COLONOSCOPY WITH PROPOFOL N/A 04/26/2016   Procedure: COLONOSCOPY WITH PROPOFOL;  Surgeon: Jerene Bears, MD;  Location: [facility] ENDOSCOPY;  Service: Gastroenterology;  Laterality: N/A;  . CORONARY ANGIOPLASTY WITH STENT PLACEMENT  04/09/2014       ptca/des mid lad   . ESOPHAGOGASTRODUODENOSCOPY N/A 12/25/2015  Procedure: ESOPHAGOGASTRODUODENOSCOPY (EGD);  Surgeon: Mauri Pole, MD;  Location: [facility] ENDOSCOPY;  Service: Endoscopy;  Laterality: N/A;  bedside  . ESOPHAGOGASTRODUODENOSCOPY (EGD) WITH PROPOFOL N/A 04/26/2016   Procedure: ESOPHAGOGASTRODUODENOSCOPY (EGD) WITH PROPOFOL;  Surgeon: Jerene Bears, MD;  [facility]: [facility] ENDOSCOPY;  Service: Gastroenterology;  Laterality: N/A;  . EYE SURGERY Bilateral    cataract  . FEMORAL ARTERY REPAIR  04/09/2014   ANGIOSEAL  . INSERTION OF DIALYSIS CATHETER  08/01/2011   Procedure: INSERTION OF DIALYSIS CATHETER;  Surgeon: Rosetta Posner, MD;  Location: [place];  Service: Vascular;  Laterality: Right;  insertion of dialysis catheter on right internal jugular vein  . INSERTION OF DIALYSIS CATHETER Right 01/20/2015   Procedure: INSERTION OF DIALYSIS CATHETER - RIGHT INTERNAL JUGULAR ;  Surgeon: Angelia Mould, MD;  Location: [employer];  Service: Vascular;  Laterality: Right;  . KIDNEY TRANSPLANT  04/25/1993  . LEFT HEART CATHETERIZATION WITH CORONARY ANGIOGRAM N/A 03/19/2014   Procedure: LEFT HEART CATHETERIZATION WITH CORONARY ANGIOGRAM;  Surgeon: Burnell Blanks, MD;  Location: [facility] CATH LAB;  Service: Cardiovascular;  Laterality: N/A;  . LYMPH NODE BIOPSY  08/10/2011   Procedure: LYMPH NODE  BIOPSY;  Surgeon: Merrie Roof, MD;  Location: [place];  Service: General;  Laterality: Left;  left groin lymph biopsy  . NEUROPLASTY / TRANSPOSITION ULNAR NERVE AT ELBOW Left after 2005  . PATCH ANGIOPLASTY Left 08/09/2016   Procedure: LEFT  ARTERIOVENOUS FISTULA PATCH ANGIOPLASTY;  Surgeon: Waynetta Sandy, MD;  Location: [place];  Service: Vascular;  Laterality: Left;  . PERCUTANEOUS CORONARY ROTOBLATOR INTERVENTION (PCI-R) N/A 04/09/2014   Procedure: PERCUTANEOUS CORONARY ROTOBLATOR INTERVENTION (PCI-R);  Surgeon: Burnell Blanks, MD;  Location: [facility] CATH LAB;  Service: Cardiovascular;  Laterality: N/A;  . PERIPHERAL VASCULAR BALLOON ANGIOPLASTY  07/11/2017   Procedure: PERIPHERAL VASCULAR BALLOON ANGIOPLASTY;  Surgeon: Waynetta Sandy, MD;  Location: [place] [facility];  Service: Cardiovascular;;  left arm AV fistula  . PERIPHERAL VASCULAR CATHETERIZATION N/A 12/25/2014   Procedure: Upper Extremity Venography;  Surgeon: Conrad Warren, MD;  Location: [place] CV LAB;  Service: Cardiovascular;  Laterality: N/A;  . PERIPHERAL VASCULAR CATHETERIZATION Left 02/24/2016   Procedure: Fistulagram;  Surgeon: Waynetta Sandy, MD;  Location: [place] [facility];  Service: Cardiovascular;  Laterality: Left;  . PERIPHERAL VASCULAR CATHETERIZATION Left 02/24/2016   Procedure: Peripheral Vascular Intervention;  Surgeon: Waynetta Sandy, MD;  Location: [place] [facility];  Service: Cardiovascular;  Laterality: Left;  AV FISTULA  . PERITONEAL CATHETER INSERTION    . PERITONEAL CATHETER REMOVAL    . REVISON OF ARTERIOVENOUS FISTULA Right 3/41/9622   Procedure: PLICATION / REVISION OF ARTERIOVENOUS FISTULA;  Surgeon: Angelia Mould, MD;  Location: [place];  Service: Vascular;  Laterality: Right;  . REVISON OF ARTERIOVENOUS FISTULA Left 09/01/2015   Procedure: SUPERFICIALIZATION OF LEFT ARM BRACHIOCEPHALIC ARTERIOVENOUS FISTULA;  Surgeon: Angelia Mould, MD;   Location: [place];  Service: Vascular;  Laterality: Left;  . REVISON OF ARTERIOVENOUS FISTULA Left 08/09/2016   Procedure: REVISION OF ARTERIAL ANASTOMOSIS OF ARTERIOVENOUS FISTULA;  Surgeon: Waynetta Sandy, MD;  Location: [place];  Service: Vascular;  Laterality: Left;  . SHOULDER ARTHROSCOPY Left ~ 2006   frozen  . TOE AMPUTATION Bilateral after 2005  . UMBILICAL HERNIA REPAIR  1990's  . VENOGRAM Right 07/28/2011   Procedure: VENOGRAM;  Surgeon: Conrad Sneads, MD;  Location: [facility] CATH LAB;  Service: Cardiovascular;  Laterality: Right;  .  VITRECTOMY Bilateral     Current Outpatient Medications  Medication Sig Dispense Refill  . allopurinol (ZYLOPRIM) 100 MG tablet TAKE 2 TABLETS BY MOUTH EVERY DAY (Patient taking differently: Take 200 mg by mouth once a day) 180 tablet 3  . aspirin EC 81 MG tablet Take 81 mg by mouth at bedtime.    . calcium acetate (PHOSLO) 667 MG capsule Take 667-1,334 mg by mouth See admin instructions. Take 1,334 mg by mouth three times a day with meals and 667 mg with each snack    . cetirizine (ZYRTEC) 10 MG tablet Take 10 mg by mouth daily.    . diphenhydrAMINE (BENADRYL) 50 MG tablet Take 50 mg by mouth every 6 (six) hours as needed (for itching or allergic reactions).    . docusate sodium (COLACE) 100 MG capsule Take 200 mg by mouth 2 (two) times daily.     . fluticasone (FLONASE) 50 MCG/ACT nasal spray Place 2 sprays into both nostrils daily as needed for allergies.     [name] Kitchen gabapentin (NEURONTIN) 300 MG capsule Take 1 capsule (300 mg total) by mouth daily. 30 capsule 0  . GLUCAGON EMERGENCY 1 MG injection Inject 1 mg into the muscle daily as needed (low blood sugar).     . hydrocortisone 1 % ointment Apply 1 application topically 2 (two) times daily. 110 g 0  . HYDROmorphone (DILAUDID) 2 MG tablet Take 2 mg by mouth 2 (two) times daily.     . hydrOXYzine (ATARAX/VISTARIL) 10 MG tablet Take 1 tablet (10 mg total) by mouth every 8 (eight) hours as needed for itching  or anxiety. 30 tablet 1  . insulin lispro (HUMALOG) 100 UNIT/ML injection Per insulin pump    . levothyroxine (SYNTHROID, LEVOTHROID) 112 MCG tablet Take 112 mcg by mouth every evening. 2 hours after dinner    . metoprolol tartrate (LOPRESSOR) 25 MG tablet Take 1 tablet (25 mg total) by mouth 2 (two) times daily. (Patient taking differently: Take 25 mg by mouth See admin instructions. Take 25 mg by mouth two times a day, but not on the nights prior to dialysis) 180 tablet 3  . midodrine (PROAMATINE) 10 MG tablet Take 1 tablet (10 mg total) by mouth 3 (three) times daily. Takes 1 tab in mornings of dialysis and takes 2 tabs 30 mins before starting dialysis (m,w,f) (Patient taking differently: Take 10 mg by mouth on the mornings of dialysis (Mon/Wed/Fri) and 20 mg thirty minutes prior to starting dialysis (Mon/Wed/Fri))    . multivitamin (RENA-VIT) TABS tablet Take 1 tablet by mouth daily.    . nitroGLYCERIN (NITROSTAT) 0.4 MG SL tablet Place 1 tablet (0.4 mg total) under the tongue every 5 (five) minutes as needed for chest pain. 25 tablet 4  . Nutritional Supplements (FEEDING SUPPLEMENT, NEPRO CARB STEADY,) LIQD Take 237 mLs by mouth daily. (Patient taking differently: Take 237 mLs by mouth every Monday, Wednesday, and Friday with hemodialysis. ) 30 Can 0  . pantoprazole (PROTONIX) 40 MG tablet Take 1 tablet (40 mg total) by mouth 2 (two) times daily before a meal. 60 tablet 2  . Polyethyl Glycol-Propyl Glycol (SYSTANE) 0.4-0.3 % SOLN Place 1 drop into both eyes 3 (three) times daily as needed (for dry eyes).     . polyethylene glycol (MIRALAX / GLYCOLAX) packet Take 17 g by mouth daily. (Patient taking differently: Take 17 g by mouth daily as needed for mild constipation. ) 14 each 0  . prasugrel (EFFIENT) 10 MG TABS tablet TAKE ONE  TABLET BY MOUTH EVERY DAY (Patient taking differently: Take 10 mg by mouth once a day) 90 tablet 3  . rOPINIRole (REQUIP) 2 MG tablet Take 2 mg by mouth every night at  bedtime 90 tablet 0  . testosterone cypionate (DEPOTESTOSTERONE CYPIONATE) 200 MG/ML injection INJECT 200 MG INTO THE MUSCLE EVERY 14 DAYS  1  . VELPHORO 500 MG chewable tablet Chew 500-1,000 mg by mouth 3 (three) times daily with meals. Chew 1,000 mg by mouth three times a day with meals and 500 mg with each snack  10   No current facility-administered medications for this visit.     Allergies as of 09/12/2017 - Review Complete 08/11/2017  Allergen Reaction Noted  . Iodinated diagnostic agents Itching, Rash, and Other (See Comments) 11/04/2013  . Prednisone Other (See Comments) 01/12/2016  . Adhesive [tape] Itching and Other (See Comments) 03/01/2015  . Amoxicillin Itching, Rash, and Other (See Comments) 03/04/2011  . Clindamycin/lincomycin Hives and Itching 03/04/2011  . Enalapril Rash and Cough 08/27/2015  . Mobic [meloxicam] Hives and Rash 08/08/2015  . Morphine Hives, Nausea Only, and Rash 08/27/2015  . Brilinta [ticagrelor] Nausea And Vomiting 02/23/2016  . Ciprofloxacin Itching and Rash 03/04/2011  . Clopidogrel Rash 08/27/2015  . Codeine Hives, Itching, and Rash 07/22/2011  . Doxycycline Rash 02/18/2014  . Hydrocodone Rash 02/18/2014  . Levofloxacin Hives and Itching 07/22/2011  . Oxycodone Rash 11/21/2011  . Rocephin [ceftriaxone] Itching 09/24/2014    Family History  Problem Relation Age of Onset  . Colon polyps Father   . Diabetes Father   . Hypertension Father   . Hypertension Mother   . Diabetes Mother   . Hyperlipidemia Mother   . Breast cancer Maternal Aunt   . Bone cancer Maternal Aunt   . Liver cancer Maternal Aunt   . Malignant hyperthermia Neg Hx   . Heart attack Neg Hx   . Stroke Neg Hx   . Colon cancer Neg Hx   . Stomach cancer Neg Hx   . Pancreatic cancer Neg Hx   . Esophageal cancer Neg Hx     Social History   Socioeconomic History  . Marital status: Married    Spouse name: Not on file  . Number of children: Not on file  . Years of  education: Not on file  . Highest education level: Not on file  Occupational History  . Occupation: Disabled  Social Needs  . Financial resource strain: Not on file  . Food insecurity:    Worry: Not on file    Inability: Not on file  . Transportation needs:    Medical: Not on file    Non-medical: Not on file  Tobacco Use  . Smoking status: Never Smoker  . Smokeless tobacco: Never Used  Substance and Sexual Activity  . Alcohol use: Yes    Comment: occasional  . Drug use: No  . Sexual activity: Not on file  Lifestyle  . Physical activity:    Days per week: Not on file    Minutes per session: Not on file  . Stress: Not on file  Relationships  . Social connections:    Talks on phone: Not on file    Gets together: Not on file    Attends religious service: Not on file    Active member of club or organization: Not on file    Attends meetings of clubs or organizations: Not on file    Relationship status: Not on file  . Intimate partner violence:  Fear of current or ex partner: Not on file    Emotionally abused: Not on file    Physically abused: Not on file    Forced sexual activity: Not on file  Other Topics Concern  . Not on file  Social History Narrative   Lives @ home with his wife in [place].     Review of Systems:    Constitutional: No weight loss, fever or chills Cardiovascular: No chest pain Respiratory: +SOB and cough Gastrointestinal: See HPI and otherwise negative   Physical Exam:  Vital signs: BP 132/82   Pulse 71   Ht _0  (1.727 m)   Wt 279 lb (126.6 kg)   SpO2 98%   BMI 42.42 kg/m    Constitutional:   Pleasant obese, ill-appearing Caucasian male appears to be in NAD, Well developed, Well nourished, alert and cooperative Head:  Normocephalic and atraumatic. Eyes:   PEERL, EOMI. No icterus. Conjunctiva pink. Ears:  Normal auditory acuity. Neck:  Supple Throat: Oral cavity and pharynx without inflammation, swelling or lesion. Tongue is  erythematous and cracked appearing, some red speckling at back of throat, no exudate Respiratory: Respirations even and unlabored. Lungs clear to auscultation bilaterally.   No wheezes, crackles, or rhonchi.  Cardiovascular: Normal S1, S2. No MRG. Regular rate and rhythm. No peripheral edema, cyanosis or pallor.  Gastrointestinal:  Limited due to patient being in wheelchair, Soft, nondistended, nontender. No rebound or guarding. Normal bowel sounds. No appreciable masses or hepatomegaly. Rectal:  Not performed.  Psychiatric: Demonstrates good judgement and reason without abnormal affect or behaviors.  MOST RECENT LABS AND IMAGING: CBC    Component Value Date/Time   WBC 8.7 07/25/2017 1942   RBC 2.93 (L) 07/25/2017 1942   HGB 9.7 (L) 07/25/2017 1942   HGB 12.6 (L) 11/29/2016 1454   HCT 32.1 (L) 07/25/2017 1942   HCT 30.7 (L) 07/13/2017 0455   PLT 263 07/25/2017 1942   PLT 278 11/29/2016 1454   MCV 109.6 (H) 07/25/2017 1942   MCV 108 (H) 11/29/2016 1454   MCH 33.1 07/25/2017 1942   MCHC 30.2 07/25/2017 1942   RDW 14.3 07/25/2017 1942   RDW 15.2 11/29/2016 1454   LYMPHSABS 1.9 07/25/2017 1942   LYMPHSABS 1.6 11/29/2016 1454   MONOABS 0.4 07/25/2017 1942   EOSABS 1.4 (H) 07/25/2017 1942   EOSABS 0.3 11/29/2016 1454   BASOSABS 0.1 07/25/2017 1942   BASOSABS 0.0 11/29/2016 1454    CMP     Component Value Date/Time   NA 135 07/25/2017 1942   NA 140 11/29/2016 1454   K 4.5 07/25/2017 1942   CL 95 (L) 07/25/2017 1942   CO2 27 07/25/2017 1942   GLUCOSE 227 (H) 07/25/2017 1942   BUN 28 (H) 07/25/2017 1942   BUN 38 (H) 11/29/2016 1454   CREATININE 9.59 (H) 07/25/2017 1942   CALCIUM 8.7 (L) 07/25/2017 1942   PROT 7.8 07/25/2017 1942   PROT 6.9 11/29/2016 1454   ALBUMIN 2.9 (L) 07/25/2017 1942   ALBUMIN 4.0 11/29/2016 1454   AST 21 07/25/2017 1942   ALT 23 07/25/2017 1942   ALKPHOS 222 (H) 07/25/2017 1942   BILITOT 0.7 07/25/2017 1942   BILITOT <0.2 11/29/2016 1454    GFRNONAA 6 (L) 07/25/2017 1942   GFRAA 6 (L) 07/25/2017 1942    Assessment: 1.  GERD: Increased over the past 3 months, no better with pantoprazole 40 mg daily and Zantac 300 mg twice daily; consider relation to patient's increased fluid load versus worsening  gastritis plus/minus esophagitis 2.  Nausea: With above 3.  Dysphagia: Describes trouble when eating solid foods or some of his pills over the past few months; consider relation to esophageal stricture versus esophagitis versus other; patient did have previous history of Candida esophagitis, could empirically treat for this in the future if no resolution or change medications  Plan: 1.  Ordered barium esophagram as well as upper GI study 2.  Pending results of above could consider repeat EGD with dilation.  This would need to be performed in the hospital due to patient's multiple comorbidities and use of oxygen at home. 3.  Change patient from pantoprazole to Dexilant 60 mg daily.  He has failed treatment with omeprazole and pantoprazole.  Prescribed #30 with 3 refills.  Briefly discussed that he should take this medicine after dialysis as it may be dialyzed out.  He can discuss further with his nephrologist. 4.  Patient can continue Zantac 150 mg twice daily. 5.  Prescribed GI cocktail 5-10 mL's every 4-6 hours as needed for breakthrough symptoms 6.  Patient to follow in clinic with Dr. Hilarie [name] in the future as he has multiple comorbidities.  Ellouise Newer, PA-C [facility] 09/12/2017, 9:22 AM  Cc: Dale Riding, MD   Addendum: Reviewed and agree with management. Pyrtle, Lajuan Lines, MD

## 2017-09-12 NOTE — Telephone Encounter (Signed)
Spoke to pharmacy and informed them of ingredients and dosage.

## 2017-09-12 NOTE — Telephone Encounter (Signed)
Prior auth initiated for [employer] on [email or web address]

## 2017-09-12 NOTE — Telephone Encounter (Signed)
[facility] calling regarding prescription for GI cocktail. They need to know the ingredients and quality.

## 2017-09-13 DIAGNOSIS — R52 Pain, unspecified: Secondary | ICD-10-CM | POA: Diagnosis not present

## 2017-09-13 DIAGNOSIS — L299 Pruritus, unspecified: Secondary | ICD-10-CM | POA: Diagnosis not present

## 2017-09-13 DIAGNOSIS — T82818A Embolism of vascular prosthetic devices, implants and grafts, initial encounter: Secondary | ICD-10-CM | POA: Diagnosis not present

## 2017-09-13 DIAGNOSIS — D631 Anemia in chronic kidney disease: Secondary | ICD-10-CM | POA: Diagnosis not present

## 2017-09-13 DIAGNOSIS — N2581 Secondary hyperparathyroidism of renal origin: Secondary | ICD-10-CM | POA: Diagnosis not present

## 2017-09-13 DIAGNOSIS — N186 End stage renal disease: Secondary | ICD-10-CM | POA: Diagnosis not present

## 2017-09-13 DIAGNOSIS — E1022 Type 1 diabetes mellitus with diabetic chronic kidney disease: Secondary | ICD-10-CM | POA: Diagnosis not present

## 2017-09-14 DIAGNOSIS — E291 Testicular hypofunction: Secondary | ICD-10-CM | POA: Diagnosis not present

## 2017-09-14 DIAGNOSIS — E039 Hypothyroidism, unspecified: Secondary | ICD-10-CM | POA: Diagnosis not present

## 2017-09-14 DIAGNOSIS — E782 Mixed hyperlipidemia: Secondary | ICD-10-CM | POA: Diagnosis not present

## 2017-09-14 DIAGNOSIS — E1059 Type 1 diabetes mellitus with other circulatory complications: Secondary | ICD-10-CM | POA: Diagnosis not present

## 2017-09-15 DIAGNOSIS — D631 Anemia in chronic kidney disease: Secondary | ICD-10-CM | POA: Diagnosis not present

## 2017-09-15 DIAGNOSIS — E1022 Type 1 diabetes mellitus with diabetic chronic kidney disease: Secondary | ICD-10-CM | POA: Diagnosis not present

## 2017-09-15 DIAGNOSIS — L299 Pruritus, unspecified: Secondary | ICD-10-CM | POA: Diagnosis not present

## 2017-09-15 DIAGNOSIS — N2581 Secondary hyperparathyroidism of renal origin: Secondary | ICD-10-CM | POA: Diagnosis not present

## 2017-09-15 DIAGNOSIS — T82818A Embolism of vascular prosthetic devices, implants and grafts, initial encounter: Secondary | ICD-10-CM | POA: Diagnosis not present

## 2017-09-15 DIAGNOSIS — N186 End stage renal disease: Secondary | ICD-10-CM | POA: Diagnosis not present

## 2017-09-15 DIAGNOSIS — R52 Pain, unspecified: Secondary | ICD-10-CM | POA: Diagnosis not present

## 2017-09-18 DIAGNOSIS — E1022 Type 1 diabetes mellitus with diabetic chronic kidney disease: Secondary | ICD-10-CM | POA: Diagnosis not present

## 2017-09-18 DIAGNOSIS — L299 Pruritus, unspecified: Secondary | ICD-10-CM | POA: Diagnosis not present

## 2017-09-18 DIAGNOSIS — N186 End stage renal disease: Secondary | ICD-10-CM | POA: Diagnosis not present

## 2017-09-18 DIAGNOSIS — D631 Anemia in chronic kidney disease: Secondary | ICD-10-CM | POA: Diagnosis not present

## 2017-09-18 DIAGNOSIS — R52 Pain, unspecified: Secondary | ICD-10-CM | POA: Diagnosis not present

## 2017-09-18 DIAGNOSIS — T82818A Embolism of vascular prosthetic devices, implants and grafts, initial encounter: Secondary | ICD-10-CM | POA: Diagnosis not present

## 2017-09-18 DIAGNOSIS — N2581 Secondary hyperparathyroidism of renal origin: Secondary | ICD-10-CM | POA: Diagnosis not present

## 2017-09-19 DIAGNOSIS — I872 Venous insufficiency (chronic) (peripheral): Secondary | ICD-10-CM | POA: Diagnosis not present

## 2017-09-19 DIAGNOSIS — M2041 Other hammer toe(s) (acquired), right foot: Secondary | ICD-10-CM | POA: Diagnosis not present

## 2017-09-20 ENCOUNTER — Other Ambulatory Visit (HOSPITAL_COMMUNITY): Payer: Medicare Other

## 2017-09-20 ENCOUNTER — Ambulatory Visit (HOSPITAL_COMMUNITY): Payer: Medicare Other

## 2017-09-20 DIAGNOSIS — L299 Pruritus, unspecified: Secondary | ICD-10-CM | POA: Diagnosis not present

## 2017-09-20 DIAGNOSIS — N186 End stage renal disease: Secondary | ICD-10-CM | POA: Diagnosis not present

## 2017-09-20 DIAGNOSIS — R52 Pain, unspecified: Secondary | ICD-10-CM | POA: Diagnosis not present

## 2017-09-20 DIAGNOSIS — D631 Anemia in chronic kidney disease: Secondary | ICD-10-CM | POA: Diagnosis not present

## 2017-09-20 DIAGNOSIS — T82818A Embolism of vascular prosthetic devices, implants and grafts, initial encounter: Secondary | ICD-10-CM | POA: Diagnosis not present

## 2017-09-20 DIAGNOSIS — E1022 Type 1 diabetes mellitus with diabetic chronic kidney disease: Secondary | ICD-10-CM | POA: Diagnosis not present

## 2017-09-20 DIAGNOSIS — N2581 Secondary hyperparathyroidism of renal origin: Secondary | ICD-10-CM | POA: Diagnosis not present

## 2017-09-21 ENCOUNTER — Ambulatory Visit: Payer: Medicare Other | Admitting: Pulmonary Disease

## 2017-09-21 ENCOUNTER — Other Ambulatory Visit (HOSPITAL_COMMUNITY): Payer: Medicare Other

## 2017-09-21 ENCOUNTER — Ambulatory Visit (HOSPITAL_COMMUNITY)
Admission: RE | Admit: 2017-09-21 | Discharge: 2017-09-21 | Disposition: A | Discharge status: Home / Self Care | Payer: Medicare Other | Source: Ambulatory Visit | Attending: Physician Assistant | Admitting: Physician Assistant

## 2017-09-21 ENCOUNTER — Other Ambulatory Visit: Payer: Self-pay | Admitting: Physician Assistant

## 2017-09-21 DIAGNOSIS — K224 Dyskinesia of esophagus: Secondary | ICD-10-CM | POA: Diagnosis not present

## 2017-09-21 DIAGNOSIS — K219 Gastro-esophageal reflux disease without esophagitis: Secondary | ICD-10-CM

## 2017-09-21 DIAGNOSIS — R05 Cough: Secondary | ICD-10-CM

## 2017-09-21 DIAGNOSIS — R059 Cough, unspecified: Secondary | ICD-10-CM

## 2017-09-22 ENCOUNTER — Other Ambulatory Visit (HOSPITAL_COMMUNITY): Payer: Self-pay | Admitting: Physician Assistant

## 2017-09-22 ENCOUNTER — Other Ambulatory Visit: Payer: Self-pay

## 2017-09-22 DIAGNOSIS — K219 Gastro-esophageal reflux disease without esophagitis: Secondary | ICD-10-CM

## 2017-09-22 DIAGNOSIS — Z992 Dependence on renal dialysis: Secondary | ICD-10-CM | POA: Diagnosis not present

## 2017-09-22 DIAGNOSIS — L299 Pruritus, unspecified: Secondary | ICD-10-CM | POA: Diagnosis not present

## 2017-09-22 DIAGNOSIS — E1022 Type 1 diabetes mellitus with diabetic chronic kidney disease: Secondary | ICD-10-CM | POA: Diagnosis not present

## 2017-09-22 DIAGNOSIS — R1310 Dysphagia, unspecified: Secondary | ICD-10-CM

## 2017-09-22 DIAGNOSIS — D631 Anemia in chronic kidney disease: Secondary | ICD-10-CM | POA: Diagnosis not present

## 2017-09-22 DIAGNOSIS — K921 Melena: Secondary | ICD-10-CM

## 2017-09-22 DIAGNOSIS — R05 Cough: Secondary | ICD-10-CM

## 2017-09-22 DIAGNOSIS — R52 Pain, unspecified: Secondary | ICD-10-CM | POA: Diagnosis not present

## 2017-09-22 DIAGNOSIS — T8612 Kidney transplant failure: Secondary | ICD-10-CM | POA: Diagnosis not present

## 2017-09-22 DIAGNOSIS — T82818A Embolism of vascular prosthetic devices, implants and grafts, initial encounter: Secondary | ICD-10-CM | POA: Diagnosis not present

## 2017-09-22 DIAGNOSIS — N2581 Secondary hyperparathyroidism of renal origin: Secondary | ICD-10-CM | POA: Diagnosis not present

## 2017-09-22 DIAGNOSIS — N186 End stage renal disease: Secondary | ICD-10-CM | POA: Diagnosis not present

## 2017-09-22 DIAGNOSIS — R110 Nausea: Secondary | ICD-10-CM

## 2017-09-22 DIAGNOSIS — R059 Cough, unspecified: Secondary | ICD-10-CM

## 2017-09-22 NOTE — Progress Notes (Signed)
10/02/17 1 pm [facility] no prep for MBSS I spoke with speech therapy and the pt must have MBSS prior to speech therapy appt.  He has been scheduled and they will call him for appt after the MBSS.

## 2017-09-25 DIAGNOSIS — N186 End stage renal disease: Secondary | ICD-10-CM | POA: Diagnosis not present

## 2017-09-25 DIAGNOSIS — E1022 Type 1 diabetes mellitus with diabetic chronic kidney disease: Secondary | ICD-10-CM | POA: Diagnosis not present

## 2017-09-25 DIAGNOSIS — D631 Anemia in chronic kidney disease: Secondary | ICD-10-CM | POA: Diagnosis not present

## 2017-09-25 DIAGNOSIS — R197 Diarrhea, unspecified: Secondary | ICD-10-CM | POA: Diagnosis not present

## 2017-09-25 DIAGNOSIS — T82818A Embolism of vascular prosthetic devices, implants and grafts, initial encounter: Secondary | ICD-10-CM | POA: Diagnosis not present

## 2017-09-25 DIAGNOSIS — N2581 Secondary hyperparathyroidism of renal origin: Secondary | ICD-10-CM | POA: Diagnosis not present

## 2017-09-25 DIAGNOSIS — L299 Pruritus, unspecified: Secondary | ICD-10-CM | POA: Diagnosis not present

## 2017-09-25 DIAGNOSIS — R52 Pain, unspecified: Secondary | ICD-10-CM | POA: Diagnosis not present

## 2017-09-27 DIAGNOSIS — N186 End stage renal disease: Secondary | ICD-10-CM | POA: Diagnosis not present

## 2017-09-27 DIAGNOSIS — R52 Pain, unspecified: Secondary | ICD-10-CM | POA: Diagnosis not present

## 2017-09-27 DIAGNOSIS — R197 Diarrhea, unspecified: Secondary | ICD-10-CM | POA: Diagnosis not present

## 2017-09-27 DIAGNOSIS — D631 Anemia in chronic kidney disease: Secondary | ICD-10-CM | POA: Diagnosis not present

## 2017-09-27 DIAGNOSIS — T82818A Embolism of vascular prosthetic devices, implants and grafts, initial encounter: Secondary | ICD-10-CM | POA: Diagnosis not present

## 2017-09-27 DIAGNOSIS — L299 Pruritus, unspecified: Secondary | ICD-10-CM | POA: Diagnosis not present

## 2017-09-27 DIAGNOSIS — N2581 Secondary hyperparathyroidism of renal origin: Secondary | ICD-10-CM | POA: Diagnosis not present

## 2017-09-27 DIAGNOSIS — E1022 Type 1 diabetes mellitus with diabetic chronic kidney disease: Secondary | ICD-10-CM | POA: Diagnosis not present

## 2017-09-27 NOTE — Telephone Encounter (Signed)
Pharmacy informed prior auth approved.

## 2017-09-27 NOTE — Telephone Encounter (Signed)
Juliann Pulse calling from [place] states they have approved Dexilant for patient for one year.

## 2017-09-28 DIAGNOSIS — M25561 Pain in right knee: Secondary | ICD-10-CM | POA: Diagnosis not present

## 2017-09-28 DIAGNOSIS — I872 Venous insufficiency (chronic) (peripheral): Secondary | ICD-10-CM | POA: Diagnosis not present

## 2017-09-28 DIAGNOSIS — L97919 Non-pressure chronic ulcer of unspecified part of right lower leg with unspecified severity: Secondary | ICD-10-CM | POA: Diagnosis not present

## 2017-09-28 DIAGNOSIS — I83019 Varicose veins of right lower extremity with ulcer of unspecified site: Secondary | ICD-10-CM | POA: Diagnosis not present

## 2017-09-29 DIAGNOSIS — L299 Pruritus, unspecified: Secondary | ICD-10-CM | POA: Diagnosis not present

## 2017-09-29 DIAGNOSIS — D631 Anemia in chronic kidney disease: Secondary | ICD-10-CM | POA: Diagnosis not present

## 2017-09-29 DIAGNOSIS — E1022 Type 1 diabetes mellitus with diabetic chronic kidney disease: Secondary | ICD-10-CM | POA: Diagnosis not present

## 2017-09-29 DIAGNOSIS — R197 Diarrhea, unspecified: Secondary | ICD-10-CM | POA: Diagnosis not present

## 2017-09-29 DIAGNOSIS — T82818A Embolism of vascular prosthetic devices, implants and grafts, initial encounter: Secondary | ICD-10-CM | POA: Diagnosis not present

## 2017-09-29 DIAGNOSIS — N2581 Secondary hyperparathyroidism of renal origin: Secondary | ICD-10-CM | POA: Diagnosis not present

## 2017-09-29 DIAGNOSIS — N186 End stage renal disease: Secondary | ICD-10-CM | POA: Diagnosis not present

## 2017-09-29 DIAGNOSIS — R52 Pain, unspecified: Secondary | ICD-10-CM | POA: Diagnosis not present

## 2017-10-02 ENCOUNTER — Ambulatory Visit (HOSPITAL_COMMUNITY)
Admission: RE | Admit: 2017-10-02 | Discharge: 2017-10-02 | Disposition: A | Discharge status: Home / Self Care | Payer: Medicare Other | Source: Ambulatory Visit | Attending: Physician Assistant | Admitting: Physician Assistant

## 2017-10-02 DIAGNOSIS — R05 Cough: Secondary | ICD-10-CM | POA: Diagnosis present

## 2017-10-02 DIAGNOSIS — R52 Pain, unspecified: Secondary | ICD-10-CM | POA: Diagnosis not present

## 2017-10-02 DIAGNOSIS — R1312 Dysphagia, oropharyngeal phase: Secondary | ICD-10-CM | POA: Diagnosis not present

## 2017-10-02 DIAGNOSIS — L299 Pruritus, unspecified: Secondary | ICD-10-CM | POA: Diagnosis not present

## 2017-10-02 DIAGNOSIS — E1022 Type 1 diabetes mellitus with diabetic chronic kidney disease: Secondary | ICD-10-CM | POA: Diagnosis not present

## 2017-10-02 DIAGNOSIS — K219 Gastro-esophageal reflux disease without esophagitis: Secondary | ICD-10-CM | POA: Insufficient documentation

## 2017-10-02 DIAGNOSIS — R1310 Dysphagia, unspecified: Secondary | ICD-10-CM

## 2017-10-02 DIAGNOSIS — R197 Diarrhea, unspecified: Secondary | ICD-10-CM | POA: Diagnosis not present

## 2017-10-02 DIAGNOSIS — R110 Nausea: Secondary | ICD-10-CM | POA: Insufficient documentation

## 2017-10-02 DIAGNOSIS — R059 Cough, unspecified: Secondary | ICD-10-CM

## 2017-10-02 DIAGNOSIS — N2581 Secondary hyperparathyroidism of renal origin: Secondary | ICD-10-CM | POA: Diagnosis not present

## 2017-10-02 DIAGNOSIS — T82818A Embolism of vascular prosthetic devices, implants and grafts, initial encounter: Secondary | ICD-10-CM | POA: Diagnosis not present

## 2017-10-02 DIAGNOSIS — D631 Anemia in chronic kidney disease: Secondary | ICD-10-CM | POA: Diagnosis not present

## 2017-10-02 DIAGNOSIS — N186 End stage renal disease: Secondary | ICD-10-CM | POA: Diagnosis not present

## 2017-10-04 ENCOUNTER — Telehealth: Payer: Self-pay | Admitting: Physician Assistant

## 2017-10-04 DIAGNOSIS — R52 Pain, unspecified: Secondary | ICD-10-CM | POA: Diagnosis not present

## 2017-10-04 DIAGNOSIS — E1022 Type 1 diabetes mellitus with diabetic chronic kidney disease: Secondary | ICD-10-CM | POA: Diagnosis not present

## 2017-10-04 DIAGNOSIS — L299 Pruritus, unspecified: Secondary | ICD-10-CM | POA: Diagnosis not present

## 2017-10-04 DIAGNOSIS — T82818A Embolism of vascular prosthetic devices, implants and grafts, initial encounter: Secondary | ICD-10-CM | POA: Diagnosis not present

## 2017-10-04 DIAGNOSIS — D631 Anemia in chronic kidney disease: Secondary | ICD-10-CM | POA: Diagnosis not present

## 2017-10-04 DIAGNOSIS — R197 Diarrhea, unspecified: Secondary | ICD-10-CM | POA: Diagnosis not present

## 2017-10-04 DIAGNOSIS — N2581 Secondary hyperparathyroidism of renal origin: Secondary | ICD-10-CM | POA: Diagnosis not present

## 2017-10-04 DIAGNOSIS — N186 End stage renal disease: Secondary | ICD-10-CM | POA: Diagnosis not present

## 2017-10-04 NOTE — Telephone Encounter (Signed)
The pt states he has been taking dexilant and zantac with worse reflux.  We discussed reflux precautions and how he was taking his dexilant and zantac.  He was advised to take dexilant 20-30 min prior to breakfast, zantac at lunch and bedtime.  MBSS was completed 6/10 will wait for results and call pt back with further req's. The pt has been advised of the information and verbalized understanding.

## 2017-10-06 DIAGNOSIS — D631 Anemia in chronic kidney disease: Secondary | ICD-10-CM | POA: Diagnosis not present

## 2017-10-06 DIAGNOSIS — L299 Pruritus, unspecified: Secondary | ICD-10-CM | POA: Diagnosis not present

## 2017-10-06 DIAGNOSIS — T82818A Embolism of vascular prosthetic devices, implants and grafts, initial encounter: Secondary | ICD-10-CM | POA: Diagnosis not present

## 2017-10-06 DIAGNOSIS — N186 End stage renal disease: Secondary | ICD-10-CM | POA: Diagnosis not present

## 2017-10-06 DIAGNOSIS — N2581 Secondary hyperparathyroidism of renal origin: Secondary | ICD-10-CM | POA: Diagnosis not present

## 2017-10-06 DIAGNOSIS — R197 Diarrhea, unspecified: Secondary | ICD-10-CM | POA: Diagnosis not present

## 2017-10-06 DIAGNOSIS — E1022 Type 1 diabetes mellitus with diabetic chronic kidney disease: Secondary | ICD-10-CM | POA: Diagnosis not present

## 2017-10-06 DIAGNOSIS — R52 Pain, unspecified: Secondary | ICD-10-CM | POA: Diagnosis not present

## 2017-10-09 DIAGNOSIS — T82818A Embolism of vascular prosthetic devices, implants and grafts, initial encounter: Secondary | ICD-10-CM | POA: Diagnosis not present

## 2017-10-09 DIAGNOSIS — D631 Anemia in chronic kidney disease: Secondary | ICD-10-CM | POA: Diagnosis not present

## 2017-10-09 DIAGNOSIS — R52 Pain, unspecified: Secondary | ICD-10-CM | POA: Diagnosis not present

## 2017-10-09 DIAGNOSIS — R197 Diarrhea, unspecified: Secondary | ICD-10-CM | POA: Diagnosis not present

## 2017-10-09 DIAGNOSIS — N186 End stage renal disease: Secondary | ICD-10-CM | POA: Diagnosis not present

## 2017-10-09 DIAGNOSIS — E1022 Type 1 diabetes mellitus with diabetic chronic kidney disease: Secondary | ICD-10-CM | POA: Diagnosis not present

## 2017-10-09 DIAGNOSIS — N2581 Secondary hyperparathyroidism of renal origin: Secondary | ICD-10-CM | POA: Diagnosis not present

## 2017-10-09 DIAGNOSIS — L299 Pruritus, unspecified: Secondary | ICD-10-CM | POA: Diagnosis not present

## 2017-10-10 DIAGNOSIS — I83018 Varicose veins of right lower extremity with ulcer other part of lower leg: Secondary | ICD-10-CM | POA: Diagnosis not present

## 2017-10-10 DIAGNOSIS — I872 Venous insufficiency (chronic) (peripheral): Secondary | ICD-10-CM | POA: Diagnosis not present

## 2017-10-10 DIAGNOSIS — L97919 Non-pressure chronic ulcer of unspecified part of right lower leg with unspecified severity: Secondary | ICD-10-CM | POA: Diagnosis not present

## 2017-10-11 DIAGNOSIS — L299 Pruritus, unspecified: Secondary | ICD-10-CM | POA: Diagnosis not present

## 2017-10-11 DIAGNOSIS — T82818A Embolism of vascular prosthetic devices, implants and grafts, initial encounter: Secondary | ICD-10-CM | POA: Diagnosis not present

## 2017-10-11 DIAGNOSIS — E1022 Type 1 diabetes mellitus with diabetic chronic kidney disease: Secondary | ICD-10-CM | POA: Diagnosis not present

## 2017-10-11 DIAGNOSIS — N186 End stage renal disease: Secondary | ICD-10-CM | POA: Diagnosis not present

## 2017-10-11 DIAGNOSIS — N2581 Secondary hyperparathyroidism of renal origin: Secondary | ICD-10-CM | POA: Diagnosis not present

## 2017-10-11 DIAGNOSIS — D631 Anemia in chronic kidney disease: Secondary | ICD-10-CM | POA: Diagnosis not present

## 2017-10-11 DIAGNOSIS — R197 Diarrhea, unspecified: Secondary | ICD-10-CM | POA: Diagnosis not present

## 2017-10-11 DIAGNOSIS — R52 Pain, unspecified: Secondary | ICD-10-CM | POA: Diagnosis not present

## 2017-10-12 ENCOUNTER — Telehealth: Payer: Self-pay | Admitting: *Deleted

## 2017-10-12 NOTE — Telephone Encounter (Signed)
Patient's Dexilant has been approved through [employer] by [employer] [employer] through 09/27/2018.  Of note:  Dx: Heartburn with symptomatic non-erosive GERD ICD 10: K21.9  Previously tried and failed: generic famotidine, Pepcid, Prilosec OTC, Protonix, Zegerid OTC.

## 2017-10-13 DIAGNOSIS — R197 Diarrhea, unspecified: Secondary | ICD-10-CM | POA: Diagnosis not present

## 2017-10-13 DIAGNOSIS — D631 Anemia in chronic kidney disease: Secondary | ICD-10-CM | POA: Diagnosis not present

## 2017-10-13 DIAGNOSIS — N2581 Secondary hyperparathyroidism of renal origin: Secondary | ICD-10-CM | POA: Diagnosis not present

## 2017-10-13 DIAGNOSIS — L299 Pruritus, unspecified: Secondary | ICD-10-CM | POA: Diagnosis not present

## 2017-10-13 DIAGNOSIS — T82818A Embolism of vascular prosthetic devices, implants and grafts, initial encounter: Secondary | ICD-10-CM | POA: Diagnosis not present

## 2017-10-13 DIAGNOSIS — N186 End stage renal disease: Secondary | ICD-10-CM | POA: Diagnosis not present

## 2017-10-13 DIAGNOSIS — R52 Pain, unspecified: Secondary | ICD-10-CM | POA: Diagnosis not present

## 2017-10-13 DIAGNOSIS — E1022 Type 1 diabetes mellitus with diabetic chronic kidney disease: Secondary | ICD-10-CM | POA: Diagnosis not present

## 2017-10-16 DIAGNOSIS — T82818A Embolism of vascular prosthetic devices, implants and grafts, initial encounter: Secondary | ICD-10-CM | POA: Diagnosis not present

## 2017-10-16 DIAGNOSIS — R52 Pain, unspecified: Secondary | ICD-10-CM | POA: Diagnosis not present

## 2017-10-16 DIAGNOSIS — L299 Pruritus, unspecified: Secondary | ICD-10-CM | POA: Diagnosis not present

## 2017-10-16 DIAGNOSIS — E1022 Type 1 diabetes mellitus with diabetic chronic kidney disease: Secondary | ICD-10-CM | POA: Diagnosis not present

## 2017-10-16 DIAGNOSIS — N186 End stage renal disease: Secondary | ICD-10-CM | POA: Diagnosis not present

## 2017-10-16 DIAGNOSIS — R197 Diarrhea, unspecified: Secondary | ICD-10-CM | POA: Diagnosis not present

## 2017-10-16 DIAGNOSIS — D631 Anemia in chronic kidney disease: Secondary | ICD-10-CM | POA: Diagnosis not present

## 2017-10-16 DIAGNOSIS — N2581 Secondary hyperparathyroidism of renal origin: Secondary | ICD-10-CM | POA: Diagnosis not present

## 2017-10-18 DIAGNOSIS — R52 Pain, unspecified: Secondary | ICD-10-CM | POA: Diagnosis not present

## 2017-10-18 DIAGNOSIS — R197 Diarrhea, unspecified: Secondary | ICD-10-CM | POA: Diagnosis not present

## 2017-10-18 DIAGNOSIS — D631 Anemia in chronic kidney disease: Secondary | ICD-10-CM | POA: Diagnosis not present

## 2017-10-18 DIAGNOSIS — E1022 Type 1 diabetes mellitus with diabetic chronic kidney disease: Secondary | ICD-10-CM | POA: Diagnosis not present

## 2017-10-18 DIAGNOSIS — L299 Pruritus, unspecified: Secondary | ICD-10-CM | POA: Diagnosis not present

## 2017-10-18 DIAGNOSIS — T82818A Embolism of vascular prosthetic devices, implants and grafts, initial encounter: Secondary | ICD-10-CM | POA: Diagnosis not present

## 2017-10-18 DIAGNOSIS — N186 End stage renal disease: Secondary | ICD-10-CM | POA: Diagnosis not present

## 2017-10-18 DIAGNOSIS — N2581 Secondary hyperparathyroidism of renal origin: Secondary | ICD-10-CM | POA: Diagnosis not present

## 2017-10-20 ENCOUNTER — Encounter

## 2017-10-20 DIAGNOSIS — N186 End stage renal disease: Secondary | ICD-10-CM | POA: Diagnosis not present

## 2017-10-20 DIAGNOSIS — D631 Anemia in chronic kidney disease: Secondary | ICD-10-CM | POA: Diagnosis not present

## 2017-10-20 DIAGNOSIS — E1022 Type 1 diabetes mellitus with diabetic chronic kidney disease: Secondary | ICD-10-CM | POA: Diagnosis not present

## 2017-10-20 DIAGNOSIS — N2581 Secondary hyperparathyroidism of renal origin: Secondary | ICD-10-CM | POA: Diagnosis not present

## 2017-10-20 DIAGNOSIS — T82818A Embolism of vascular prosthetic devices, implants and grafts, initial encounter: Secondary | ICD-10-CM | POA: Diagnosis not present

## 2017-10-20 DIAGNOSIS — R197 Diarrhea, unspecified: Secondary | ICD-10-CM | POA: Diagnosis not present

## 2017-10-20 DIAGNOSIS — R52 Pain, unspecified: Secondary | ICD-10-CM | POA: Diagnosis not present

## 2017-10-20 DIAGNOSIS — L299 Pruritus, unspecified: Secondary | ICD-10-CM | POA: Diagnosis not present

## 2017-10-22 DIAGNOSIS — T8612 Kidney transplant failure: Secondary | ICD-10-CM | POA: Diagnosis not present

## 2017-10-22 DIAGNOSIS — Z992 Dependence on renal dialysis: Secondary | ICD-10-CM | POA: Diagnosis not present

## 2017-10-22 DIAGNOSIS — N186 End stage renal disease: Secondary | ICD-10-CM | POA: Diagnosis not present

## 2017-10-23 ENCOUNTER — Telehealth: Payer: Self-pay | Admitting: Physician Assistant

## 2017-10-23 NOTE — Telephone Encounter (Signed)
Anderson Malta have you reviewed the results?

## 2017-10-23 NOTE — Telephone Encounter (Signed)
Yes, it appears mostly oropharyngeal per speech pathology. Does he feel any better with Dexilant?  If he continues with symptoms, could consider EGD-would need hospital. Thanks-JLL

## 2017-10-23 NOTE — Telephone Encounter (Signed)
The pt has been advised of the information and verbalized understanding.   The pt requests an appt to discuss further testing and/or treatment with Dr Hilarie Fredrickson.  Appt made for 01/01/18.  He will call in the interim if needed.

## 2017-10-31 ENCOUNTER — Other Ambulatory Visit: Payer: Self-pay | Admitting: *Deleted

## 2017-11-01 DIAGNOSIS — T82818A Embolism of vascular prosthetic devices, implants and grafts, initial encounter: Secondary | ICD-10-CM | POA: Diagnosis not present

## 2017-11-01 DIAGNOSIS — D509 Iron deficiency anemia, unspecified: Secondary | ICD-10-CM | POA: Diagnosis not present

## 2017-11-01 DIAGNOSIS — N186 End stage renal disease: Secondary | ICD-10-CM | POA: Diagnosis not present

## 2017-11-01 DIAGNOSIS — E1022 Type 1 diabetes mellitus with diabetic chronic kidney disease: Secondary | ICD-10-CM | POA: Diagnosis not present

## 2017-11-01 DIAGNOSIS — D631 Anemia in chronic kidney disease: Secondary | ICD-10-CM | POA: Diagnosis not present

## 2017-11-01 DIAGNOSIS — R197 Diarrhea, unspecified: Secondary | ICD-10-CM | POA: Diagnosis not present

## 2017-11-01 DIAGNOSIS — N2581 Secondary hyperparathyroidism of renal origin: Secondary | ICD-10-CM | POA: Diagnosis not present

## 2017-11-01 MED ORDER — ROPINIROLE HCL 2 MG PO TABS: 2.0000 mg | ORAL_TABLET | Freq: Every day | ORAL | 0 refills | Status: DC

## 2017-11-01 NOTE — Telephone Encounter (Signed)
Pt has multiple medications with sedating and anticholinergic side effects. This was discussed at length with [name] by Dr. Cyndia Skeeters on 07/2017. Will refill for 1 mo. Pt will need to make an appointment for medication review.

## 2017-11-02 ENCOUNTER — Other Ambulatory Visit: Payer: Self-pay | Admitting: Family Medicine

## 2017-11-02 NOTE — Telephone Encounter (Signed)
LM for patient ok per DPR asking him to call the office back and schedule an appointment for medication management.  Katherin Ramey,CMA

## 2017-11-03 DIAGNOSIS — D631 Anemia in chronic kidney disease: Secondary | ICD-10-CM | POA: Diagnosis not present

## 2017-11-03 DIAGNOSIS — T82818A Embolism of vascular prosthetic devices, implants and grafts, initial encounter: Secondary | ICD-10-CM | POA: Diagnosis not present

## 2017-11-03 DIAGNOSIS — R197 Diarrhea, unspecified: Secondary | ICD-10-CM | POA: Diagnosis not present

## 2017-11-03 DIAGNOSIS — E1022 Type 1 diabetes mellitus with diabetic chronic kidney disease: Secondary | ICD-10-CM | POA: Diagnosis not present

## 2017-11-03 DIAGNOSIS — N186 End stage renal disease: Secondary | ICD-10-CM | POA: Diagnosis not present

## 2017-11-03 DIAGNOSIS — D509 Iron deficiency anemia, unspecified: Secondary | ICD-10-CM | POA: Diagnosis not present

## 2017-11-03 DIAGNOSIS — N2581 Secondary hyperparathyroidism of renal origin: Secondary | ICD-10-CM | POA: Diagnosis not present

## 2017-11-06 DIAGNOSIS — D631 Anemia in chronic kidney disease: Secondary | ICD-10-CM | POA: Diagnosis not present

## 2017-11-06 DIAGNOSIS — T82818A Embolism of vascular prosthetic devices, implants and grafts, initial encounter: Secondary | ICD-10-CM | POA: Diagnosis not present

## 2017-11-06 DIAGNOSIS — N186 End stage renal disease: Secondary | ICD-10-CM | POA: Diagnosis not present

## 2017-11-06 DIAGNOSIS — N2581 Secondary hyperparathyroidism of renal origin: Secondary | ICD-10-CM | POA: Diagnosis not present

## 2017-11-06 DIAGNOSIS — R197 Diarrhea, unspecified: Secondary | ICD-10-CM | POA: Diagnosis not present

## 2017-11-06 DIAGNOSIS — D509 Iron deficiency anemia, unspecified: Secondary | ICD-10-CM | POA: Diagnosis not present

## 2017-11-06 DIAGNOSIS — E1022 Type 1 diabetes mellitus with diabetic chronic kidney disease: Secondary | ICD-10-CM | POA: Diagnosis not present

## 2017-11-08 DIAGNOSIS — R197 Diarrhea, unspecified: Secondary | ICD-10-CM | POA: Diagnosis not present

## 2017-11-08 DIAGNOSIS — N2581 Secondary hyperparathyroidism of renal origin: Secondary | ICD-10-CM | POA: Diagnosis not present

## 2017-11-08 DIAGNOSIS — D631 Anemia in chronic kidney disease: Secondary | ICD-10-CM | POA: Diagnosis not present

## 2017-11-08 DIAGNOSIS — N186 End stage renal disease: Secondary | ICD-10-CM | POA: Diagnosis not present

## 2017-11-08 DIAGNOSIS — D509 Iron deficiency anemia, unspecified: Secondary | ICD-10-CM | POA: Diagnosis not present

## 2017-11-08 DIAGNOSIS — E1022 Type 1 diabetes mellitus with diabetic chronic kidney disease: Secondary | ICD-10-CM | POA: Diagnosis not present

## 2017-11-08 DIAGNOSIS — T82818A Embolism of vascular prosthetic devices, implants and grafts, initial encounter: Secondary | ICD-10-CM | POA: Diagnosis not present

## 2017-11-10 DIAGNOSIS — D509 Iron deficiency anemia, unspecified: Secondary | ICD-10-CM | POA: Diagnosis not present

## 2017-11-10 DIAGNOSIS — R197 Diarrhea, unspecified: Secondary | ICD-10-CM | POA: Diagnosis not present

## 2017-11-10 DIAGNOSIS — N186 End stage renal disease: Secondary | ICD-10-CM | POA: Diagnosis not present

## 2017-11-10 DIAGNOSIS — E1022 Type 1 diabetes mellitus with diabetic chronic kidney disease: Secondary | ICD-10-CM | POA: Diagnosis not present

## 2017-11-10 DIAGNOSIS — T82818A Embolism of vascular prosthetic devices, implants and grafts, initial encounter: Secondary | ICD-10-CM | POA: Diagnosis not present

## 2017-11-10 DIAGNOSIS — D631 Anemia in chronic kidney disease: Secondary | ICD-10-CM | POA: Diagnosis not present

## 2017-11-10 DIAGNOSIS — N2581 Secondary hyperparathyroidism of renal origin: Secondary | ICD-10-CM | POA: Diagnosis not present

## 2017-11-13 DIAGNOSIS — D509 Iron deficiency anemia, unspecified: Secondary | ICD-10-CM | POA: Diagnosis not present

## 2017-11-13 DIAGNOSIS — R197 Diarrhea, unspecified: Secondary | ICD-10-CM | POA: Diagnosis not present

## 2017-11-13 DIAGNOSIS — N186 End stage renal disease: Secondary | ICD-10-CM | POA: Diagnosis not present

## 2017-11-13 DIAGNOSIS — N2581 Secondary hyperparathyroidism of renal origin: Secondary | ICD-10-CM | POA: Diagnosis not present

## 2017-11-13 DIAGNOSIS — E1022 Type 1 diabetes mellitus with diabetic chronic kidney disease: Secondary | ICD-10-CM | POA: Diagnosis not present

## 2017-11-13 DIAGNOSIS — D631 Anemia in chronic kidney disease: Secondary | ICD-10-CM | POA: Diagnosis not present

## 2017-11-13 DIAGNOSIS — T82818A Embolism of vascular prosthetic devices, implants and grafts, initial encounter: Secondary | ICD-10-CM | POA: Diagnosis not present

## 2017-11-15 DIAGNOSIS — D509 Iron deficiency anemia, unspecified: Secondary | ICD-10-CM | POA: Diagnosis not present

## 2017-11-15 DIAGNOSIS — T82818A Embolism of vascular prosthetic devices, implants and grafts, initial encounter: Secondary | ICD-10-CM | POA: Diagnosis not present

## 2017-11-15 DIAGNOSIS — D631 Anemia in chronic kidney disease: Secondary | ICD-10-CM | POA: Diagnosis not present

## 2017-11-15 DIAGNOSIS — E1022 Type 1 diabetes mellitus with diabetic chronic kidney disease: Secondary | ICD-10-CM | POA: Diagnosis not present

## 2017-11-15 DIAGNOSIS — R197 Diarrhea, unspecified: Secondary | ICD-10-CM | POA: Diagnosis not present

## 2017-11-15 DIAGNOSIS — N2581 Secondary hyperparathyroidism of renal origin: Secondary | ICD-10-CM | POA: Diagnosis not present

## 2017-11-15 DIAGNOSIS — N186 End stage renal disease: Secondary | ICD-10-CM | POA: Diagnosis not present

## 2017-11-17 DIAGNOSIS — T82818A Embolism of vascular prosthetic devices, implants and grafts, initial encounter: Secondary | ICD-10-CM | POA: Diagnosis not present

## 2017-11-17 DIAGNOSIS — N186 End stage renal disease: Secondary | ICD-10-CM | POA: Diagnosis not present

## 2017-11-17 DIAGNOSIS — R197 Diarrhea, unspecified: Secondary | ICD-10-CM | POA: Diagnosis not present

## 2017-11-17 DIAGNOSIS — E1022 Type 1 diabetes mellitus with diabetic chronic kidney disease: Secondary | ICD-10-CM | POA: Diagnosis not present

## 2017-11-17 DIAGNOSIS — D631 Anemia in chronic kidney disease: Secondary | ICD-10-CM | POA: Diagnosis not present

## 2017-11-17 DIAGNOSIS — N2581 Secondary hyperparathyroidism of renal origin: Secondary | ICD-10-CM | POA: Diagnosis not present

## 2017-11-17 DIAGNOSIS — D509 Iron deficiency anemia, unspecified: Secondary | ICD-10-CM | POA: Diagnosis not present

## 2017-11-20 DIAGNOSIS — D509 Iron deficiency anemia, unspecified: Secondary | ICD-10-CM | POA: Diagnosis not present

## 2017-11-20 DIAGNOSIS — R197 Diarrhea, unspecified: Secondary | ICD-10-CM | POA: Diagnosis not present

## 2017-11-20 DIAGNOSIS — N186 End stage renal disease: Secondary | ICD-10-CM | POA: Diagnosis not present

## 2017-11-20 DIAGNOSIS — D631 Anemia in chronic kidney disease: Secondary | ICD-10-CM | POA: Diagnosis not present

## 2017-11-20 DIAGNOSIS — E1022 Type 1 diabetes mellitus with diabetic chronic kidney disease: Secondary | ICD-10-CM | POA: Diagnosis not present

## 2017-11-20 DIAGNOSIS — T82818A Embolism of vascular prosthetic devices, implants and grafts, initial encounter: Secondary | ICD-10-CM | POA: Diagnosis not present

## 2017-11-20 DIAGNOSIS — N2581 Secondary hyperparathyroidism of renal origin: Secondary | ICD-10-CM | POA: Diagnosis not present

## 2017-11-22 DIAGNOSIS — N2581 Secondary hyperparathyroidism of renal origin: Secondary | ICD-10-CM | POA: Diagnosis not present

## 2017-11-22 DIAGNOSIS — Z992 Dependence on renal dialysis: Secondary | ICD-10-CM | POA: Diagnosis not present

## 2017-11-22 DIAGNOSIS — R197 Diarrhea, unspecified: Secondary | ICD-10-CM | POA: Diagnosis not present

## 2017-11-22 DIAGNOSIS — T8612 Kidney transplant failure: Secondary | ICD-10-CM | POA: Diagnosis not present

## 2017-11-22 DIAGNOSIS — E1022 Type 1 diabetes mellitus with diabetic chronic kidney disease: Secondary | ICD-10-CM | POA: Diagnosis not present

## 2017-11-22 DIAGNOSIS — T82818A Embolism of vascular prosthetic devices, implants and grafts, initial encounter: Secondary | ICD-10-CM | POA: Diagnosis not present

## 2017-11-22 DIAGNOSIS — D509 Iron deficiency anemia, unspecified: Secondary | ICD-10-CM | POA: Diagnosis not present

## 2017-11-22 DIAGNOSIS — N186 End stage renal disease: Secondary | ICD-10-CM | POA: Diagnosis not present

## 2017-11-22 DIAGNOSIS — D631 Anemia in chronic kidney disease: Secondary | ICD-10-CM | POA: Diagnosis not present

## 2017-11-24 DIAGNOSIS — T82818A Embolism of vascular prosthetic devices, implants and grafts, initial encounter: Secondary | ICD-10-CM | POA: Diagnosis not present

## 2017-11-24 DIAGNOSIS — N2581 Secondary hyperparathyroidism of renal origin: Secondary | ICD-10-CM | POA: Diagnosis not present

## 2017-11-24 DIAGNOSIS — N186 End stage renal disease: Secondary | ICD-10-CM | POA: Diagnosis not present

## 2017-11-24 DIAGNOSIS — E1022 Type 1 diabetes mellitus with diabetic chronic kidney disease: Secondary | ICD-10-CM | POA: Diagnosis not present

## 2017-11-24 DIAGNOSIS — D509 Iron deficiency anemia, unspecified: Secondary | ICD-10-CM | POA: Diagnosis not present

## 2017-11-24 DIAGNOSIS — D631 Anemia in chronic kidney disease: Secondary | ICD-10-CM | POA: Diagnosis not present

## 2017-11-27 DIAGNOSIS — D509 Iron deficiency anemia, unspecified: Secondary | ICD-10-CM | POA: Diagnosis not present

## 2017-11-27 DIAGNOSIS — N2581 Secondary hyperparathyroidism of renal origin: Secondary | ICD-10-CM | POA: Diagnosis not present

## 2017-11-27 DIAGNOSIS — N186 End stage renal disease: Secondary | ICD-10-CM | POA: Diagnosis not present

## 2017-11-27 DIAGNOSIS — T82818A Embolism of vascular prosthetic devices, implants and grafts, initial encounter: Secondary | ICD-10-CM | POA: Diagnosis not present

## 2017-11-27 DIAGNOSIS — E1022 Type 1 diabetes mellitus with diabetic chronic kidney disease: Secondary | ICD-10-CM | POA: Diagnosis not present

## 2017-11-27 DIAGNOSIS — D631 Anemia in chronic kidney disease: Secondary | ICD-10-CM | POA: Diagnosis not present

## 2017-11-28 DIAGNOSIS — Z79899 Other long term (current) drug therapy: Secondary | ICD-10-CM | POA: Diagnosis not present

## 2017-11-28 DIAGNOSIS — M2550 Pain in unspecified joint: Secondary | ICD-10-CM | POA: Diagnosis not present

## 2017-11-29 DIAGNOSIS — N186 End stage renal disease: Secondary | ICD-10-CM | POA: Diagnosis not present

## 2017-11-29 DIAGNOSIS — D509 Iron deficiency anemia, unspecified: Secondary | ICD-10-CM | POA: Diagnosis not present

## 2017-11-29 DIAGNOSIS — N2581 Secondary hyperparathyroidism of renal origin: Secondary | ICD-10-CM | POA: Diagnosis not present

## 2017-11-29 DIAGNOSIS — T82818A Embolism of vascular prosthetic devices, implants and grafts, initial encounter: Secondary | ICD-10-CM | POA: Diagnosis not present

## 2017-11-29 DIAGNOSIS — D631 Anemia in chronic kidney disease: Secondary | ICD-10-CM | POA: Diagnosis not present

## 2017-11-29 DIAGNOSIS — E1022 Type 1 diabetes mellitus with diabetic chronic kidney disease: Secondary | ICD-10-CM | POA: Diagnosis not present

## 2017-12-01 DIAGNOSIS — N2581 Secondary hyperparathyroidism of renal origin: Secondary | ICD-10-CM | POA: Diagnosis not present

## 2017-12-01 DIAGNOSIS — D509 Iron deficiency anemia, unspecified: Secondary | ICD-10-CM | POA: Diagnosis not present

## 2017-12-01 DIAGNOSIS — E1022 Type 1 diabetes mellitus with diabetic chronic kidney disease: Secondary | ICD-10-CM | POA: Diagnosis not present

## 2017-12-01 DIAGNOSIS — D631 Anemia in chronic kidney disease: Secondary | ICD-10-CM | POA: Diagnosis not present

## 2017-12-01 DIAGNOSIS — N186 End stage renal disease: Secondary | ICD-10-CM | POA: Diagnosis not present

## 2017-12-01 DIAGNOSIS — T82818A Embolism of vascular prosthetic devices, implants and grafts, initial encounter: Secondary | ICD-10-CM | POA: Diagnosis not present

## 2017-12-04 DIAGNOSIS — D631 Anemia in chronic kidney disease: Secondary | ICD-10-CM | POA: Diagnosis not present

## 2017-12-04 DIAGNOSIS — D509 Iron deficiency anemia, unspecified: Secondary | ICD-10-CM | POA: Diagnosis not present

## 2017-12-04 DIAGNOSIS — N2581 Secondary hyperparathyroidism of renal origin: Secondary | ICD-10-CM | POA: Diagnosis not present

## 2017-12-04 DIAGNOSIS — T82818A Embolism of vascular prosthetic devices, implants and grafts, initial encounter: Secondary | ICD-10-CM | POA: Diagnosis not present

## 2017-12-04 DIAGNOSIS — E1022 Type 1 diabetes mellitus with diabetic chronic kidney disease: Secondary | ICD-10-CM | POA: Diagnosis not present

## 2017-12-04 DIAGNOSIS — N186 End stage renal disease: Secondary | ICD-10-CM | POA: Diagnosis not present

## 2017-12-06 DIAGNOSIS — T82818A Embolism of vascular prosthetic devices, implants and grafts, initial encounter: Secondary | ICD-10-CM | POA: Diagnosis not present

## 2017-12-06 DIAGNOSIS — D631 Anemia in chronic kidney disease: Secondary | ICD-10-CM | POA: Diagnosis not present

## 2017-12-06 DIAGNOSIS — N2581 Secondary hyperparathyroidism of renal origin: Secondary | ICD-10-CM | POA: Diagnosis not present

## 2017-12-06 DIAGNOSIS — D509 Iron deficiency anemia, unspecified: Secondary | ICD-10-CM | POA: Diagnosis not present

## 2017-12-06 DIAGNOSIS — N186 End stage renal disease: Secondary | ICD-10-CM | POA: Diagnosis not present

## 2017-12-06 DIAGNOSIS — E1022 Type 1 diabetes mellitus with diabetic chronic kidney disease: Secondary | ICD-10-CM | POA: Diagnosis not present

## 2017-12-08 DIAGNOSIS — T82818A Embolism of vascular prosthetic devices, implants and grafts, initial encounter: Secondary | ICD-10-CM | POA: Diagnosis not present

## 2017-12-08 DIAGNOSIS — N186 End stage renal disease: Secondary | ICD-10-CM | POA: Diagnosis not present

## 2017-12-08 DIAGNOSIS — D631 Anemia in chronic kidney disease: Secondary | ICD-10-CM | POA: Diagnosis not present

## 2017-12-08 DIAGNOSIS — E1022 Type 1 diabetes mellitus with diabetic chronic kidney disease: Secondary | ICD-10-CM | POA: Diagnosis not present

## 2017-12-08 DIAGNOSIS — D509 Iron deficiency anemia, unspecified: Secondary | ICD-10-CM | POA: Diagnosis not present

## 2017-12-08 DIAGNOSIS — N2581 Secondary hyperparathyroidism of renal origin: Secondary | ICD-10-CM | POA: Diagnosis not present

## 2017-12-11 DIAGNOSIS — T82818A Embolism of vascular prosthetic devices, implants and grafts, initial encounter: Secondary | ICD-10-CM | POA: Diagnosis not present

## 2017-12-11 DIAGNOSIS — N2581 Secondary hyperparathyroidism of renal origin: Secondary | ICD-10-CM | POA: Diagnosis not present

## 2017-12-11 DIAGNOSIS — E1022 Type 1 diabetes mellitus with diabetic chronic kidney disease: Secondary | ICD-10-CM | POA: Diagnosis not present

## 2017-12-11 DIAGNOSIS — D509 Iron deficiency anemia, unspecified: Secondary | ICD-10-CM | POA: Diagnosis not present

## 2017-12-11 DIAGNOSIS — N186 End stage renal disease: Secondary | ICD-10-CM | POA: Diagnosis not present

## 2017-12-11 DIAGNOSIS — D631 Anemia in chronic kidney disease: Secondary | ICD-10-CM | POA: Diagnosis not present

## 2017-12-13 DIAGNOSIS — N2581 Secondary hyperparathyroidism of renal origin: Secondary | ICD-10-CM | POA: Diagnosis not present

## 2017-12-13 DIAGNOSIS — D631 Anemia in chronic kidney disease: Secondary | ICD-10-CM | POA: Diagnosis not present

## 2017-12-13 DIAGNOSIS — N186 End stage renal disease: Secondary | ICD-10-CM | POA: Diagnosis not present

## 2017-12-13 DIAGNOSIS — T82818A Embolism of vascular prosthetic devices, implants and grafts, initial encounter: Secondary | ICD-10-CM | POA: Diagnosis not present

## 2017-12-13 DIAGNOSIS — D509 Iron deficiency anemia, unspecified: Secondary | ICD-10-CM | POA: Diagnosis not present

## 2017-12-13 DIAGNOSIS — E1022 Type 1 diabetes mellitus with diabetic chronic kidney disease: Secondary | ICD-10-CM | POA: Diagnosis not present

## 2017-12-15 DIAGNOSIS — D631 Anemia in chronic kidney disease: Secondary | ICD-10-CM | POA: Diagnosis not present

## 2017-12-15 DIAGNOSIS — N2581 Secondary hyperparathyroidism of renal origin: Secondary | ICD-10-CM | POA: Diagnosis not present

## 2017-12-15 DIAGNOSIS — E1022 Type 1 diabetes mellitus with diabetic chronic kidney disease: Secondary | ICD-10-CM | POA: Diagnosis not present

## 2017-12-15 DIAGNOSIS — N186 End stage renal disease: Secondary | ICD-10-CM | POA: Diagnosis not present

## 2017-12-15 DIAGNOSIS — D509 Iron deficiency anemia, unspecified: Secondary | ICD-10-CM | POA: Diagnosis not present

## 2017-12-15 DIAGNOSIS — T82818A Embolism of vascular prosthetic devices, implants and grafts, initial encounter: Secondary | ICD-10-CM | POA: Diagnosis not present

## 2017-12-18 DIAGNOSIS — N2581 Secondary hyperparathyroidism of renal origin: Secondary | ICD-10-CM | POA: Diagnosis not present

## 2017-12-18 DIAGNOSIS — T82818A Embolism of vascular prosthetic devices, implants and grafts, initial encounter: Secondary | ICD-10-CM | POA: Diagnosis not present

## 2017-12-18 DIAGNOSIS — E1022 Type 1 diabetes mellitus with diabetic chronic kidney disease: Secondary | ICD-10-CM | POA: Diagnosis not present

## 2017-12-18 DIAGNOSIS — N186 End stage renal disease: Secondary | ICD-10-CM | POA: Diagnosis not present

## 2017-12-18 DIAGNOSIS — D509 Iron deficiency anemia, unspecified: Secondary | ICD-10-CM | POA: Diagnosis not present

## 2017-12-18 DIAGNOSIS — D631 Anemia in chronic kidney disease: Secondary | ICD-10-CM | POA: Diagnosis not present

## 2017-12-20 DIAGNOSIS — D631 Anemia in chronic kidney disease: Secondary | ICD-10-CM | POA: Diagnosis not present

## 2017-12-20 DIAGNOSIS — T82818A Embolism of vascular prosthetic devices, implants and grafts, initial encounter: Secondary | ICD-10-CM | POA: Diagnosis not present

## 2017-12-20 DIAGNOSIS — N186 End stage renal disease: Secondary | ICD-10-CM | POA: Diagnosis not present

## 2017-12-20 DIAGNOSIS — E1022 Type 1 diabetes mellitus with diabetic chronic kidney disease: Secondary | ICD-10-CM | POA: Diagnosis not present

## 2017-12-20 DIAGNOSIS — N2581 Secondary hyperparathyroidism of renal origin: Secondary | ICD-10-CM | POA: Diagnosis not present

## 2017-12-20 DIAGNOSIS — D509 Iron deficiency anemia, unspecified: Secondary | ICD-10-CM | POA: Diagnosis not present

## 2017-12-22 ENCOUNTER — Encounter: Payer: Self-pay | Admitting: *Deleted

## 2017-12-22 DIAGNOSIS — D631 Anemia in chronic kidney disease: Secondary | ICD-10-CM | POA: Diagnosis not present

## 2017-12-22 DIAGNOSIS — N2581 Secondary hyperparathyroidism of renal origin: Secondary | ICD-10-CM | POA: Diagnosis not present

## 2017-12-22 DIAGNOSIS — N186 End stage renal disease: Secondary | ICD-10-CM | POA: Diagnosis not present

## 2017-12-22 DIAGNOSIS — E1022 Type 1 diabetes mellitus with diabetic chronic kidney disease: Secondary | ICD-10-CM | POA: Diagnosis not present

## 2017-12-22 DIAGNOSIS — D509 Iron deficiency anemia, unspecified: Secondary | ICD-10-CM | POA: Diagnosis not present

## 2017-12-22 DIAGNOSIS — T82818A Embolism of vascular prosthetic devices, implants and grafts, initial encounter: Secondary | ICD-10-CM | POA: Diagnosis not present

## 2017-12-23 DIAGNOSIS — Z992 Dependence on renal dialysis: Secondary | ICD-10-CM | POA: Diagnosis not present

## 2017-12-23 DIAGNOSIS — N186 End stage renal disease: Secondary | ICD-10-CM | POA: Diagnosis not present

## 2017-12-23 DIAGNOSIS — T8612 Kidney transplant failure: Secondary | ICD-10-CM | POA: Diagnosis not present

## 2017-12-25 DIAGNOSIS — D631 Anemia in chronic kidney disease: Secondary | ICD-10-CM | POA: Diagnosis not present

## 2017-12-25 DIAGNOSIS — N2581 Secondary hyperparathyroidism of renal origin: Secondary | ICD-10-CM | POA: Diagnosis not present

## 2017-12-25 DIAGNOSIS — T82818A Embolism of vascular prosthetic devices, implants and grafts, initial encounter: Secondary | ICD-10-CM | POA: Diagnosis not present

## 2017-12-25 DIAGNOSIS — Z23 Encounter for immunization: Secondary | ICD-10-CM | POA: Diagnosis not present

## 2017-12-25 DIAGNOSIS — E1022 Type 1 diabetes mellitus with diabetic chronic kidney disease: Secondary | ICD-10-CM | POA: Diagnosis not present

## 2017-12-25 DIAGNOSIS — N186 End stage renal disease: Secondary | ICD-10-CM | POA: Diagnosis not present

## 2017-12-25 DIAGNOSIS — D509 Iron deficiency anemia, unspecified: Secondary | ICD-10-CM | POA: Diagnosis not present

## 2017-12-27 DIAGNOSIS — N2581 Secondary hyperparathyroidism of renal origin: Secondary | ICD-10-CM | POA: Diagnosis not present

## 2017-12-27 DIAGNOSIS — Z23 Encounter for immunization: Secondary | ICD-10-CM | POA: Diagnosis not present

## 2017-12-27 DIAGNOSIS — T82818A Embolism of vascular prosthetic devices, implants and grafts, initial encounter: Secondary | ICD-10-CM | POA: Diagnosis not present

## 2017-12-27 DIAGNOSIS — N186 End stage renal disease: Secondary | ICD-10-CM | POA: Diagnosis not present

## 2017-12-27 DIAGNOSIS — E1022 Type 1 diabetes mellitus with diabetic chronic kidney disease: Secondary | ICD-10-CM | POA: Diagnosis not present

## 2017-12-27 DIAGNOSIS — D631 Anemia in chronic kidney disease: Secondary | ICD-10-CM | POA: Diagnosis not present

## 2017-12-27 DIAGNOSIS — D509 Iron deficiency anemia, unspecified: Secondary | ICD-10-CM | POA: Diagnosis not present

## 2017-12-29 DIAGNOSIS — N2581 Secondary hyperparathyroidism of renal origin: Secondary | ICD-10-CM | POA: Diagnosis not present

## 2017-12-29 DIAGNOSIS — T82818A Embolism of vascular prosthetic devices, implants and grafts, initial encounter: Secondary | ICD-10-CM | POA: Diagnosis not present

## 2017-12-29 DIAGNOSIS — N186 End stage renal disease: Secondary | ICD-10-CM | POA: Diagnosis not present

## 2017-12-29 DIAGNOSIS — Z23 Encounter for immunization: Secondary | ICD-10-CM | POA: Diagnosis not present

## 2017-12-29 DIAGNOSIS — D509 Iron deficiency anemia, unspecified: Secondary | ICD-10-CM | POA: Diagnosis not present

## 2017-12-29 DIAGNOSIS — D631 Anemia in chronic kidney disease: Secondary | ICD-10-CM | POA: Diagnosis not present

## 2017-12-29 DIAGNOSIS — E1022 Type 1 diabetes mellitus with diabetic chronic kidney disease: Secondary | ICD-10-CM | POA: Diagnosis not present

## 2018-01-01 ENCOUNTER — Ambulatory Visit: Payer: Medicare Other | Admitting: Internal Medicine

## 2018-01-01 DIAGNOSIS — D509 Iron deficiency anemia, unspecified: Secondary | ICD-10-CM | POA: Diagnosis not present

## 2018-01-01 DIAGNOSIS — N2581 Secondary hyperparathyroidism of renal origin: Secondary | ICD-10-CM | POA: Diagnosis not present

## 2018-01-01 DIAGNOSIS — T82818A Embolism of vascular prosthetic devices, implants and grafts, initial encounter: Secondary | ICD-10-CM | POA: Diagnosis not present

## 2018-01-01 DIAGNOSIS — D631 Anemia in chronic kidney disease: Secondary | ICD-10-CM | POA: Diagnosis not present

## 2018-01-01 DIAGNOSIS — Z23 Encounter for immunization: Secondary | ICD-10-CM | POA: Diagnosis not present

## 2018-01-01 DIAGNOSIS — N186 End stage renal disease: Secondary | ICD-10-CM | POA: Diagnosis not present

## 2018-01-01 DIAGNOSIS — E1022 Type 1 diabetes mellitus with diabetic chronic kidney disease: Secondary | ICD-10-CM | POA: Diagnosis not present

## 2018-01-02 DIAGNOSIS — E108 Type 1 diabetes mellitus with unspecified complications: Secondary | ICD-10-CM | POA: Diagnosis not present

## 2018-01-02 DIAGNOSIS — M1711 Unilateral primary osteoarthritis, right knee: Secondary | ICD-10-CM | POA: Diagnosis not present

## 2018-01-02 DIAGNOSIS — Z794 Long term (current) use of insulin: Secondary | ICD-10-CM | POA: Diagnosis not present

## 2018-01-02 DIAGNOSIS — E1040 Type 1 diabetes mellitus with diabetic neuropathy, unspecified: Secondary | ICD-10-CM | POA: Diagnosis not present

## 2018-01-02 DIAGNOSIS — E1059 Type 1 diabetes mellitus with other circulatory complications: Secondary | ICD-10-CM | POA: Diagnosis not present

## 2018-01-03 DIAGNOSIS — D509 Iron deficiency anemia, unspecified: Secondary | ICD-10-CM | POA: Diagnosis not present

## 2018-01-03 DIAGNOSIS — N186 End stage renal disease: Secondary | ICD-10-CM | POA: Diagnosis not present

## 2018-01-03 DIAGNOSIS — T82818A Embolism of vascular prosthetic devices, implants and grafts, initial encounter: Secondary | ICD-10-CM | POA: Diagnosis not present

## 2018-01-03 DIAGNOSIS — N2581 Secondary hyperparathyroidism of renal origin: Secondary | ICD-10-CM | POA: Diagnosis not present

## 2018-01-03 DIAGNOSIS — Z23 Encounter for immunization: Secondary | ICD-10-CM | POA: Diagnosis not present

## 2018-01-03 DIAGNOSIS — E1022 Type 1 diabetes mellitus with diabetic chronic kidney disease: Secondary | ICD-10-CM | POA: Diagnosis not present

## 2018-01-03 DIAGNOSIS — D631 Anemia in chronic kidney disease: Secondary | ICD-10-CM | POA: Diagnosis not present

## 2018-01-05 DIAGNOSIS — D509 Iron deficiency anemia, unspecified: Secondary | ICD-10-CM | POA: Diagnosis not present

## 2018-01-05 DIAGNOSIS — D631 Anemia in chronic kidney disease: Secondary | ICD-10-CM | POA: Diagnosis not present

## 2018-01-05 DIAGNOSIS — E1022 Type 1 diabetes mellitus with diabetic chronic kidney disease: Secondary | ICD-10-CM | POA: Diagnosis not present

## 2018-01-05 DIAGNOSIS — N2581 Secondary hyperparathyroidism of renal origin: Secondary | ICD-10-CM | POA: Diagnosis not present

## 2018-01-05 DIAGNOSIS — N186 End stage renal disease: Secondary | ICD-10-CM | POA: Diagnosis not present

## 2018-01-05 DIAGNOSIS — T82818A Embolism of vascular prosthetic devices, implants and grafts, initial encounter: Secondary | ICD-10-CM | POA: Diagnosis not present

## 2018-01-05 DIAGNOSIS — Z23 Encounter for immunization: Secondary | ICD-10-CM | POA: Diagnosis not present

## 2018-01-08 DIAGNOSIS — N2581 Secondary hyperparathyroidism of renal origin: Secondary | ICD-10-CM | POA: Diagnosis not present

## 2018-01-08 DIAGNOSIS — T82818A Embolism of vascular prosthetic devices, implants and grafts, initial encounter: Secondary | ICD-10-CM | POA: Diagnosis not present

## 2018-01-08 DIAGNOSIS — D631 Anemia in chronic kidney disease: Secondary | ICD-10-CM | POA: Diagnosis not present

## 2018-01-08 DIAGNOSIS — E1022 Type 1 diabetes mellitus with diabetic chronic kidney disease: Secondary | ICD-10-CM | POA: Diagnosis not present

## 2018-01-08 DIAGNOSIS — Z23 Encounter for immunization: Secondary | ICD-10-CM | POA: Diagnosis not present

## 2018-01-08 DIAGNOSIS — D509 Iron deficiency anemia, unspecified: Secondary | ICD-10-CM | POA: Diagnosis not present

## 2018-01-08 DIAGNOSIS — N186 End stage renal disease: Secondary | ICD-10-CM | POA: Diagnosis not present

## 2018-01-10 DIAGNOSIS — D631 Anemia in chronic kidney disease: Secondary | ICD-10-CM | POA: Diagnosis not present

## 2018-01-10 DIAGNOSIS — N186 End stage renal disease: Secondary | ICD-10-CM | POA: Diagnosis not present

## 2018-01-10 DIAGNOSIS — E1022 Type 1 diabetes mellitus with diabetic chronic kidney disease: Secondary | ICD-10-CM | POA: Diagnosis not present

## 2018-01-10 DIAGNOSIS — D509 Iron deficiency anemia, unspecified: Secondary | ICD-10-CM | POA: Diagnosis not present

## 2018-01-10 DIAGNOSIS — Z23 Encounter for immunization: Secondary | ICD-10-CM | POA: Diagnosis not present

## 2018-01-10 DIAGNOSIS — T82818A Embolism of vascular prosthetic devices, implants and grafts, initial encounter: Secondary | ICD-10-CM | POA: Diagnosis not present

## 2018-01-10 DIAGNOSIS — N2581 Secondary hyperparathyroidism of renal origin: Secondary | ICD-10-CM | POA: Diagnosis not present

## 2018-01-12 DIAGNOSIS — Z23 Encounter for immunization: Secondary | ICD-10-CM | POA: Diagnosis not present

## 2018-01-12 DIAGNOSIS — N186 End stage renal disease: Secondary | ICD-10-CM | POA: Diagnosis not present

## 2018-01-12 DIAGNOSIS — N2581 Secondary hyperparathyroidism of renal origin: Secondary | ICD-10-CM | POA: Diagnosis not present

## 2018-01-12 DIAGNOSIS — D509 Iron deficiency anemia, unspecified: Secondary | ICD-10-CM | POA: Diagnosis not present

## 2018-01-12 DIAGNOSIS — E1022 Type 1 diabetes mellitus with diabetic chronic kidney disease: Secondary | ICD-10-CM | POA: Diagnosis not present

## 2018-01-12 DIAGNOSIS — T82818A Embolism of vascular prosthetic devices, implants and grafts, initial encounter: Secondary | ICD-10-CM | POA: Diagnosis not present

## 2018-01-12 DIAGNOSIS — D631 Anemia in chronic kidney disease: Secondary | ICD-10-CM | POA: Diagnosis not present

## 2018-01-15 DIAGNOSIS — E1022 Type 1 diabetes mellitus with diabetic chronic kidney disease: Secondary | ICD-10-CM | POA: Diagnosis not present

## 2018-01-15 DIAGNOSIS — T82818A Embolism of vascular prosthetic devices, implants and grafts, initial encounter: Secondary | ICD-10-CM | POA: Diagnosis not present

## 2018-01-15 DIAGNOSIS — D509 Iron deficiency anemia, unspecified: Secondary | ICD-10-CM | POA: Diagnosis not present

## 2018-01-15 DIAGNOSIS — D631 Anemia in chronic kidney disease: Secondary | ICD-10-CM | POA: Diagnosis not present

## 2018-01-15 DIAGNOSIS — N2581 Secondary hyperparathyroidism of renal origin: Secondary | ICD-10-CM | POA: Diagnosis not present

## 2018-01-15 DIAGNOSIS — Z23 Encounter for immunization: Secondary | ICD-10-CM | POA: Diagnosis not present

## 2018-01-15 DIAGNOSIS — N186 End stage renal disease: Secondary | ICD-10-CM | POA: Diagnosis not present

## 2018-01-17 DIAGNOSIS — E1022 Type 1 diabetes mellitus with diabetic chronic kidney disease: Secondary | ICD-10-CM | POA: Diagnosis not present

## 2018-01-17 DIAGNOSIS — D631 Anemia in chronic kidney disease: Secondary | ICD-10-CM | POA: Diagnosis not present

## 2018-01-17 DIAGNOSIS — N186 End stage renal disease: Secondary | ICD-10-CM | POA: Diagnosis not present

## 2018-01-17 DIAGNOSIS — N2581 Secondary hyperparathyroidism of renal origin: Secondary | ICD-10-CM | POA: Diagnosis not present

## 2018-01-17 DIAGNOSIS — Z23 Encounter for immunization: Secondary | ICD-10-CM | POA: Diagnosis not present

## 2018-01-17 DIAGNOSIS — T82818A Embolism of vascular prosthetic devices, implants and grafts, initial encounter: Secondary | ICD-10-CM | POA: Diagnosis not present

## 2018-01-17 DIAGNOSIS — D509 Iron deficiency anemia, unspecified: Secondary | ICD-10-CM | POA: Diagnosis not present

## 2018-01-18 DIAGNOSIS — M6208 Separation of muscle (nontraumatic), other site: Secondary | ICD-10-CM | POA: Diagnosis not present

## 2018-01-18 DIAGNOSIS — R748 Abnormal levels of other serum enzymes: Secondary | ICD-10-CM | POA: Diagnosis not present

## 2018-01-18 DIAGNOSIS — K219 Gastro-esophageal reflux disease without esophagitis: Secondary | ICD-10-CM | POA: Diagnosis not present

## 2018-01-19 DIAGNOSIS — D509 Iron deficiency anemia, unspecified: Secondary | ICD-10-CM | POA: Diagnosis not present

## 2018-01-19 DIAGNOSIS — E1022 Type 1 diabetes mellitus with diabetic chronic kidney disease: Secondary | ICD-10-CM | POA: Diagnosis not present

## 2018-01-19 DIAGNOSIS — D631 Anemia in chronic kidney disease: Secondary | ICD-10-CM | POA: Diagnosis not present

## 2018-01-19 DIAGNOSIS — N2581 Secondary hyperparathyroidism of renal origin: Secondary | ICD-10-CM | POA: Diagnosis not present

## 2018-01-19 DIAGNOSIS — T82818A Embolism of vascular prosthetic devices, implants and grafts, initial encounter: Secondary | ICD-10-CM | POA: Diagnosis not present

## 2018-01-19 DIAGNOSIS — Z23 Encounter for immunization: Secondary | ICD-10-CM | POA: Diagnosis not present

## 2018-01-19 DIAGNOSIS — N186 End stage renal disease: Secondary | ICD-10-CM | POA: Diagnosis not present

## 2018-01-22 DIAGNOSIS — T82818A Embolism of vascular prosthetic devices, implants and grafts, initial encounter: Secondary | ICD-10-CM | POA: Diagnosis not present

## 2018-01-22 DIAGNOSIS — D509 Iron deficiency anemia, unspecified: Secondary | ICD-10-CM | POA: Diagnosis not present

## 2018-01-22 DIAGNOSIS — Z23 Encounter for immunization: Secondary | ICD-10-CM | POA: Diagnosis not present

## 2018-01-22 DIAGNOSIS — N2581 Secondary hyperparathyroidism of renal origin: Secondary | ICD-10-CM | POA: Diagnosis not present

## 2018-01-22 DIAGNOSIS — T8612 Kidney transplant failure: Secondary | ICD-10-CM | POA: Diagnosis not present

## 2018-01-22 DIAGNOSIS — N186 End stage renal disease: Secondary | ICD-10-CM | POA: Diagnosis not present

## 2018-01-22 DIAGNOSIS — E1022 Type 1 diabetes mellitus with diabetic chronic kidney disease: Secondary | ICD-10-CM | POA: Diagnosis not present

## 2018-01-22 DIAGNOSIS — D631 Anemia in chronic kidney disease: Secondary | ICD-10-CM | POA: Diagnosis not present

## 2018-01-22 DIAGNOSIS — Z992 Dependence on renal dialysis: Secondary | ICD-10-CM | POA: Diagnosis not present

## 2018-01-25 DIAGNOSIS — E291 Testicular hypofunction: Secondary | ICD-10-CM | POA: Diagnosis not present

## 2018-01-25 DIAGNOSIS — E782 Mixed hyperlipidemia: Secondary | ICD-10-CM | POA: Diagnosis not present

## 2018-01-25 DIAGNOSIS — E1059 Type 1 diabetes mellitus with other circulatory complications: Secondary | ICD-10-CM | POA: Diagnosis not present

## 2018-01-25 DIAGNOSIS — E039 Hypothyroidism, unspecified: Secondary | ICD-10-CM | POA: Diagnosis not present

## 2018-01-27 DIAGNOSIS — E1022 Type 1 diabetes mellitus with diabetic chronic kidney disease: Secondary | ICD-10-CM | POA: Diagnosis not present

## 2018-01-29 DIAGNOSIS — N2581 Secondary hyperparathyroidism of renal origin: Secondary | ICD-10-CM | POA: Diagnosis not present

## 2018-01-29 DIAGNOSIS — E1022 Type 1 diabetes mellitus with diabetic chronic kidney disease: Secondary | ICD-10-CM | POA: Diagnosis not present

## 2018-01-29 DIAGNOSIS — D509 Iron deficiency anemia, unspecified: Secondary | ICD-10-CM | POA: Diagnosis not present

## 2018-01-29 DIAGNOSIS — R52 Pain, unspecified: Secondary | ICD-10-CM | POA: Diagnosis not present

## 2018-01-29 DIAGNOSIS — N186 End stage renal disease: Secondary | ICD-10-CM | POA: Diagnosis not present

## 2018-01-29 DIAGNOSIS — D631 Anemia in chronic kidney disease: Secondary | ICD-10-CM | POA: Diagnosis not present

## 2018-01-29 DIAGNOSIS — T82818A Embolism of vascular prosthetic devices, implants and grafts, initial encounter: Secondary | ICD-10-CM | POA: Diagnosis not present

## 2018-01-31 DIAGNOSIS — R52 Pain, unspecified: Secondary | ICD-10-CM | POA: Diagnosis not present

## 2018-01-31 DIAGNOSIS — N2581 Secondary hyperparathyroidism of renal origin: Secondary | ICD-10-CM | POA: Diagnosis not present

## 2018-01-31 DIAGNOSIS — D509 Iron deficiency anemia, unspecified: Secondary | ICD-10-CM | POA: Diagnosis not present

## 2018-01-31 DIAGNOSIS — N186 End stage renal disease: Secondary | ICD-10-CM | POA: Diagnosis not present

## 2018-01-31 DIAGNOSIS — D631 Anemia in chronic kidney disease: Secondary | ICD-10-CM | POA: Diagnosis not present

## 2018-01-31 DIAGNOSIS — T82818A Embolism of vascular prosthetic devices, implants and grafts, initial encounter: Secondary | ICD-10-CM | POA: Diagnosis not present

## 2018-01-31 DIAGNOSIS — E1022 Type 1 diabetes mellitus with diabetic chronic kidney disease: Secondary | ICD-10-CM | POA: Diagnosis not present

## 2018-02-02 DIAGNOSIS — E039 Hypothyroidism, unspecified: Secondary | ICD-10-CM | POA: Diagnosis not present

## 2018-02-02 DIAGNOSIS — E291 Testicular hypofunction: Secondary | ICD-10-CM | POA: Diagnosis not present

## 2018-02-02 DIAGNOSIS — N2581 Secondary hyperparathyroidism of renal origin: Secondary | ICD-10-CM | POA: Diagnosis not present

## 2018-02-02 DIAGNOSIS — R52 Pain, unspecified: Secondary | ICD-10-CM | POA: Diagnosis not present

## 2018-02-02 DIAGNOSIS — H18832 Recurrent erosion of cornea, left eye: Secondary | ICD-10-CM | POA: Diagnosis not present

## 2018-02-02 DIAGNOSIS — D509 Iron deficiency anemia, unspecified: Secondary | ICD-10-CM | POA: Diagnosis not present

## 2018-02-02 DIAGNOSIS — N186 End stage renal disease: Secondary | ICD-10-CM | POA: Diagnosis not present

## 2018-02-02 DIAGNOSIS — E782 Mixed hyperlipidemia: Secondary | ICD-10-CM | POA: Diagnosis not present

## 2018-02-02 DIAGNOSIS — E1040 Type 1 diabetes mellitus with diabetic neuropathy, unspecified: Secondary | ICD-10-CM | POA: Diagnosis not present

## 2018-02-02 DIAGNOSIS — Z992 Dependence on renal dialysis: Secondary | ICD-10-CM | POA: Diagnosis not present

## 2018-02-02 DIAGNOSIS — D631 Anemia in chronic kidney disease: Secondary | ICD-10-CM | POA: Diagnosis not present

## 2018-02-02 DIAGNOSIS — T82818A Embolism of vascular prosthetic devices, implants and grafts, initial encounter: Secondary | ICD-10-CM | POA: Diagnosis not present

## 2018-02-02 DIAGNOSIS — E1022 Type 1 diabetes mellitus with diabetic chronic kidney disease: Secondary | ICD-10-CM | POA: Diagnosis not present

## 2018-02-05 DIAGNOSIS — D631 Anemia in chronic kidney disease: Secondary | ICD-10-CM | POA: Diagnosis not present

## 2018-02-05 DIAGNOSIS — E1022 Type 1 diabetes mellitus with diabetic chronic kidney disease: Secondary | ICD-10-CM | POA: Diagnosis not present

## 2018-02-05 DIAGNOSIS — N2581 Secondary hyperparathyroidism of renal origin: Secondary | ICD-10-CM | POA: Diagnosis not present

## 2018-02-05 DIAGNOSIS — T82818A Embolism of vascular prosthetic devices, implants and grafts, initial encounter: Secondary | ICD-10-CM | POA: Diagnosis not present

## 2018-02-05 DIAGNOSIS — N186 End stage renal disease: Secondary | ICD-10-CM | POA: Diagnosis not present

## 2018-02-05 DIAGNOSIS — D509 Iron deficiency anemia, unspecified: Secondary | ICD-10-CM | POA: Diagnosis not present

## 2018-02-05 DIAGNOSIS — R52 Pain, unspecified: Secondary | ICD-10-CM | POA: Diagnosis not present

## 2018-02-07 DIAGNOSIS — T82818A Embolism of vascular prosthetic devices, implants and grafts, initial encounter: Secondary | ICD-10-CM | POA: Diagnosis not present

## 2018-02-07 DIAGNOSIS — D509 Iron deficiency anemia, unspecified: Secondary | ICD-10-CM | POA: Diagnosis not present

## 2018-02-07 DIAGNOSIS — D631 Anemia in chronic kidney disease: Secondary | ICD-10-CM | POA: Diagnosis not present

## 2018-02-07 DIAGNOSIS — N186 End stage renal disease: Secondary | ICD-10-CM | POA: Diagnosis not present

## 2018-02-07 DIAGNOSIS — R52 Pain, unspecified: Secondary | ICD-10-CM | POA: Diagnosis not present

## 2018-02-07 DIAGNOSIS — N2581 Secondary hyperparathyroidism of renal origin: Secondary | ICD-10-CM | POA: Diagnosis not present

## 2018-02-07 DIAGNOSIS — E1022 Type 1 diabetes mellitus with diabetic chronic kidney disease: Secondary | ICD-10-CM | POA: Diagnosis not present

## 2018-02-08 DIAGNOSIS — L97919 Non-pressure chronic ulcer of unspecified part of right lower leg with unspecified severity: Secondary | ICD-10-CM | POA: Diagnosis not present

## 2018-02-08 DIAGNOSIS — S90414A Abrasion, right lesser toe(s), initial encounter: Secondary | ICD-10-CM | POA: Diagnosis not present

## 2018-02-08 DIAGNOSIS — I83019 Varicose veins of right lower extremity with ulcer of unspecified site: Secondary | ICD-10-CM | POA: Diagnosis not present

## 2018-02-09 DIAGNOSIS — N2581 Secondary hyperparathyroidism of renal origin: Secondary | ICD-10-CM | POA: Diagnosis not present

## 2018-02-09 DIAGNOSIS — D631 Anemia in chronic kidney disease: Secondary | ICD-10-CM | POA: Diagnosis not present

## 2018-02-09 DIAGNOSIS — E1022 Type 1 diabetes mellitus with diabetic chronic kidney disease: Secondary | ICD-10-CM | POA: Diagnosis not present

## 2018-02-09 DIAGNOSIS — T82818A Embolism of vascular prosthetic devices, implants and grafts, initial encounter: Secondary | ICD-10-CM | POA: Diagnosis not present

## 2018-02-09 DIAGNOSIS — R52 Pain, unspecified: Secondary | ICD-10-CM | POA: Diagnosis not present

## 2018-02-09 DIAGNOSIS — N186 End stage renal disease: Secondary | ICD-10-CM | POA: Diagnosis not present

## 2018-02-09 DIAGNOSIS — D509 Iron deficiency anemia, unspecified: Secondary | ICD-10-CM | POA: Diagnosis not present

## 2018-02-12 DIAGNOSIS — N2581 Secondary hyperparathyroidism of renal origin: Secondary | ICD-10-CM | POA: Diagnosis not present

## 2018-02-12 DIAGNOSIS — N186 End stage renal disease: Secondary | ICD-10-CM | POA: Diagnosis not present

## 2018-02-12 DIAGNOSIS — D509 Iron deficiency anemia, unspecified: Secondary | ICD-10-CM | POA: Diagnosis not present

## 2018-02-12 DIAGNOSIS — D631 Anemia in chronic kidney disease: Secondary | ICD-10-CM | POA: Diagnosis not present

## 2018-02-12 DIAGNOSIS — R52 Pain, unspecified: Secondary | ICD-10-CM | POA: Diagnosis not present

## 2018-02-12 DIAGNOSIS — T82818A Embolism of vascular prosthetic devices, implants and grafts, initial encounter: Secondary | ICD-10-CM | POA: Diagnosis not present

## 2018-02-12 DIAGNOSIS — E1022 Type 1 diabetes mellitus with diabetic chronic kidney disease: Secondary | ICD-10-CM | POA: Diagnosis not present

## 2018-02-14 DIAGNOSIS — N2581 Secondary hyperparathyroidism of renal origin: Secondary | ICD-10-CM | POA: Diagnosis not present

## 2018-02-14 DIAGNOSIS — T82818A Embolism of vascular prosthetic devices, implants and grafts, initial encounter: Secondary | ICD-10-CM | POA: Diagnosis not present

## 2018-02-14 DIAGNOSIS — R52 Pain, unspecified: Secondary | ICD-10-CM | POA: Diagnosis not present

## 2018-02-14 DIAGNOSIS — E1022 Type 1 diabetes mellitus with diabetic chronic kidney disease: Secondary | ICD-10-CM | POA: Diagnosis not present

## 2018-02-14 DIAGNOSIS — D631 Anemia in chronic kidney disease: Secondary | ICD-10-CM | POA: Diagnosis not present

## 2018-02-14 DIAGNOSIS — N186 End stage renal disease: Secondary | ICD-10-CM | POA: Diagnosis not present

## 2018-02-14 DIAGNOSIS — D509 Iron deficiency anemia, unspecified: Secondary | ICD-10-CM | POA: Diagnosis not present

## 2018-02-16 DIAGNOSIS — R52 Pain, unspecified: Secondary | ICD-10-CM | POA: Diagnosis not present

## 2018-02-16 DIAGNOSIS — D509 Iron deficiency anemia, unspecified: Secondary | ICD-10-CM | POA: Diagnosis not present

## 2018-02-16 DIAGNOSIS — T82818A Embolism of vascular prosthetic devices, implants and grafts, initial encounter: Secondary | ICD-10-CM | POA: Diagnosis not present

## 2018-02-16 DIAGNOSIS — N2581 Secondary hyperparathyroidism of renal origin: Secondary | ICD-10-CM | POA: Diagnosis not present

## 2018-02-16 DIAGNOSIS — E1022 Type 1 diabetes mellitus with diabetic chronic kidney disease: Secondary | ICD-10-CM | POA: Diagnosis not present

## 2018-02-16 DIAGNOSIS — D631 Anemia in chronic kidney disease: Secondary | ICD-10-CM | POA: Diagnosis not present

## 2018-02-16 DIAGNOSIS — N186 End stage renal disease: Secondary | ICD-10-CM | POA: Diagnosis not present

## 2018-02-19 DIAGNOSIS — N186 End stage renal disease: Secondary | ICD-10-CM | POA: Diagnosis not present

## 2018-02-19 DIAGNOSIS — D631 Anemia in chronic kidney disease: Secondary | ICD-10-CM | POA: Diagnosis not present

## 2018-02-19 DIAGNOSIS — R52 Pain, unspecified: Secondary | ICD-10-CM | POA: Diagnosis not present

## 2018-02-19 DIAGNOSIS — N2581 Secondary hyperparathyroidism of renal origin: Secondary | ICD-10-CM | POA: Diagnosis not present

## 2018-02-19 DIAGNOSIS — T82818A Embolism of vascular prosthetic devices, implants and grafts, initial encounter: Secondary | ICD-10-CM | POA: Diagnosis not present

## 2018-02-19 DIAGNOSIS — D509 Iron deficiency anemia, unspecified: Secondary | ICD-10-CM | POA: Diagnosis not present

## 2018-02-19 DIAGNOSIS — E1022 Type 1 diabetes mellitus with diabetic chronic kidney disease: Secondary | ICD-10-CM | POA: Diagnosis not present

## 2018-02-21 DIAGNOSIS — D631 Anemia in chronic kidney disease: Secondary | ICD-10-CM | POA: Diagnosis not present

## 2018-02-21 DIAGNOSIS — N2581 Secondary hyperparathyroidism of renal origin: Secondary | ICD-10-CM | POA: Diagnosis not present

## 2018-02-21 DIAGNOSIS — D509 Iron deficiency anemia, unspecified: Secondary | ICD-10-CM | POA: Diagnosis not present

## 2018-02-21 DIAGNOSIS — N186 End stage renal disease: Secondary | ICD-10-CM | POA: Diagnosis not present

## 2018-02-21 DIAGNOSIS — T82818A Embolism of vascular prosthetic devices, implants and grafts, initial encounter: Secondary | ICD-10-CM | POA: Diagnosis not present

## 2018-02-21 DIAGNOSIS — E1022 Type 1 diabetes mellitus with diabetic chronic kidney disease: Secondary | ICD-10-CM | POA: Diagnosis not present

## 2018-02-21 DIAGNOSIS — R52 Pain, unspecified: Secondary | ICD-10-CM | POA: Diagnosis not present

## 2018-02-22 DIAGNOSIS — N186 End stage renal disease: Secondary | ICD-10-CM | POA: Diagnosis not present

## 2018-02-22 DIAGNOSIS — T8612 Kidney transplant failure: Secondary | ICD-10-CM | POA: Diagnosis not present

## 2018-02-22 DIAGNOSIS — Z992 Dependence on renal dialysis: Secondary | ICD-10-CM | POA: Diagnosis not present

## 2018-02-23 DIAGNOSIS — D631 Anemia in chronic kidney disease: Secondary | ICD-10-CM | POA: Diagnosis not present

## 2018-02-23 DIAGNOSIS — T82818A Embolism of vascular prosthetic devices, implants and grafts, initial encounter: Secondary | ICD-10-CM | POA: Diagnosis not present

## 2018-02-23 DIAGNOSIS — N2581 Secondary hyperparathyroidism of renal origin: Secondary | ICD-10-CM | POA: Diagnosis not present

## 2018-02-23 DIAGNOSIS — N186 End stage renal disease: Secondary | ICD-10-CM | POA: Diagnosis not present

## 2018-02-23 DIAGNOSIS — E1022 Type 1 diabetes mellitus with diabetic chronic kidney disease: Secondary | ICD-10-CM | POA: Diagnosis not present

## 2018-02-23 DIAGNOSIS — R197 Diarrhea, unspecified: Secondary | ICD-10-CM | POA: Diagnosis not present

## 2018-02-23 DIAGNOSIS — D509 Iron deficiency anemia, unspecified: Secondary | ICD-10-CM | POA: Diagnosis not present

## 2018-02-26 DIAGNOSIS — T82818A Embolism of vascular prosthetic devices, implants and grafts, initial encounter: Secondary | ICD-10-CM | POA: Diagnosis not present

## 2018-02-26 DIAGNOSIS — N2581 Secondary hyperparathyroidism of renal origin: Secondary | ICD-10-CM | POA: Diagnosis not present

## 2018-02-26 DIAGNOSIS — E1022 Type 1 diabetes mellitus with diabetic chronic kidney disease: Secondary | ICD-10-CM | POA: Diagnosis not present

## 2018-02-26 DIAGNOSIS — N186 End stage renal disease: Secondary | ICD-10-CM | POA: Diagnosis not present

## 2018-02-26 DIAGNOSIS — R197 Diarrhea, unspecified: Secondary | ICD-10-CM | POA: Diagnosis not present

## 2018-02-26 DIAGNOSIS — D631 Anemia in chronic kidney disease: Secondary | ICD-10-CM | POA: Diagnosis not present

## 2018-02-26 DIAGNOSIS — D509 Iron deficiency anemia, unspecified: Secondary | ICD-10-CM | POA: Diagnosis not present

## 2018-02-28 ENCOUNTER — Other Ambulatory Visit: Payer: Self-pay | Admitting: Physician Assistant

## 2018-02-28 DIAGNOSIS — E1022 Type 1 diabetes mellitus with diabetic chronic kidney disease: Secondary | ICD-10-CM | POA: Diagnosis not present

## 2018-02-28 DIAGNOSIS — N2581 Secondary hyperparathyroidism of renal origin: Secondary | ICD-10-CM | POA: Diagnosis not present

## 2018-02-28 DIAGNOSIS — R197 Diarrhea, unspecified: Secondary | ICD-10-CM | POA: Diagnosis not present

## 2018-02-28 DIAGNOSIS — N186 End stage renal disease: Secondary | ICD-10-CM | POA: Diagnosis not present

## 2018-02-28 DIAGNOSIS — T82818A Embolism of vascular prosthetic devices, implants and grafts, initial encounter: Secondary | ICD-10-CM | POA: Diagnosis not present

## 2018-02-28 DIAGNOSIS — D509 Iron deficiency anemia, unspecified: Secondary | ICD-10-CM | POA: Diagnosis not present

## 2018-02-28 DIAGNOSIS — D631 Anemia in chronic kidney disease: Secondary | ICD-10-CM | POA: Diagnosis not present

## 2018-03-01 DIAGNOSIS — M2550 Pain in unspecified joint: Secondary | ICD-10-CM | POA: Diagnosis not present

## 2018-03-01 DIAGNOSIS — Z23 Encounter for immunization: Secondary | ICD-10-CM | POA: Diagnosis not present

## 2018-03-02 DIAGNOSIS — R197 Diarrhea, unspecified: Secondary | ICD-10-CM | POA: Diagnosis not present

## 2018-03-02 DIAGNOSIS — N186 End stage renal disease: Secondary | ICD-10-CM | POA: Diagnosis not present

## 2018-03-02 DIAGNOSIS — D631 Anemia in chronic kidney disease: Secondary | ICD-10-CM | POA: Diagnosis not present

## 2018-03-02 DIAGNOSIS — D509 Iron deficiency anemia, unspecified: Secondary | ICD-10-CM | POA: Diagnosis not present

## 2018-03-02 DIAGNOSIS — T82818A Embolism of vascular prosthetic devices, implants and grafts, initial encounter: Secondary | ICD-10-CM | POA: Diagnosis not present

## 2018-03-02 DIAGNOSIS — N2581 Secondary hyperparathyroidism of renal origin: Secondary | ICD-10-CM | POA: Diagnosis not present

## 2018-03-02 DIAGNOSIS — E1022 Type 1 diabetes mellitus with diabetic chronic kidney disease: Secondary | ICD-10-CM | POA: Diagnosis not present

## 2018-03-05 DIAGNOSIS — R197 Diarrhea, unspecified: Secondary | ICD-10-CM | POA: Diagnosis not present

## 2018-03-05 DIAGNOSIS — N2581 Secondary hyperparathyroidism of renal origin: Secondary | ICD-10-CM | POA: Diagnosis not present

## 2018-03-05 DIAGNOSIS — E1022 Type 1 diabetes mellitus with diabetic chronic kidney disease: Secondary | ICD-10-CM | POA: Diagnosis not present

## 2018-03-05 DIAGNOSIS — T82818A Embolism of vascular prosthetic devices, implants and grafts, initial encounter: Secondary | ICD-10-CM | POA: Diagnosis not present

## 2018-03-05 DIAGNOSIS — D509 Iron deficiency anemia, unspecified: Secondary | ICD-10-CM | POA: Diagnosis not present

## 2018-03-05 DIAGNOSIS — D631 Anemia in chronic kidney disease: Secondary | ICD-10-CM | POA: Diagnosis not present

## 2018-03-05 DIAGNOSIS — N186 End stage renal disease: Secondary | ICD-10-CM | POA: Diagnosis not present

## 2018-03-07 DIAGNOSIS — D509 Iron deficiency anemia, unspecified: Secondary | ICD-10-CM | POA: Diagnosis not present

## 2018-03-07 DIAGNOSIS — T82818A Embolism of vascular prosthetic devices, implants and grafts, initial encounter: Secondary | ICD-10-CM | POA: Diagnosis not present

## 2018-03-07 DIAGNOSIS — N2581 Secondary hyperparathyroidism of renal origin: Secondary | ICD-10-CM | POA: Diagnosis not present

## 2018-03-07 DIAGNOSIS — D631 Anemia in chronic kidney disease: Secondary | ICD-10-CM | POA: Diagnosis not present

## 2018-03-07 DIAGNOSIS — E1022 Type 1 diabetes mellitus with diabetic chronic kidney disease: Secondary | ICD-10-CM | POA: Diagnosis not present

## 2018-03-07 DIAGNOSIS — R197 Diarrhea, unspecified: Secondary | ICD-10-CM | POA: Diagnosis not present

## 2018-03-07 DIAGNOSIS — N186 End stage renal disease: Secondary | ICD-10-CM | POA: Diagnosis not present

## 2018-03-08 DIAGNOSIS — M1711 Unilateral primary osteoarthritis, right knee: Secondary | ICD-10-CM | POA: Diagnosis not present

## 2018-03-09 DIAGNOSIS — N186 End stage renal disease: Secondary | ICD-10-CM | POA: Diagnosis not present

## 2018-03-09 DIAGNOSIS — R197 Diarrhea, unspecified: Secondary | ICD-10-CM | POA: Diagnosis not present

## 2018-03-09 DIAGNOSIS — D631 Anemia in chronic kidney disease: Secondary | ICD-10-CM | POA: Diagnosis not present

## 2018-03-09 DIAGNOSIS — D509 Iron deficiency anemia, unspecified: Secondary | ICD-10-CM | POA: Diagnosis not present

## 2018-03-09 DIAGNOSIS — T82818A Embolism of vascular prosthetic devices, implants and grafts, initial encounter: Secondary | ICD-10-CM | POA: Diagnosis not present

## 2018-03-09 DIAGNOSIS — N2581 Secondary hyperparathyroidism of renal origin: Secondary | ICD-10-CM | POA: Diagnosis not present

## 2018-03-09 DIAGNOSIS — E1022 Type 1 diabetes mellitus with diabetic chronic kidney disease: Secondary | ICD-10-CM | POA: Diagnosis not present

## 2018-03-12 DIAGNOSIS — N186 End stage renal disease: Secondary | ICD-10-CM | POA: Diagnosis not present

## 2018-03-12 DIAGNOSIS — D509 Iron deficiency anemia, unspecified: Secondary | ICD-10-CM | POA: Diagnosis not present

## 2018-03-12 DIAGNOSIS — D631 Anemia in chronic kidney disease: Secondary | ICD-10-CM | POA: Diagnosis not present

## 2018-03-12 DIAGNOSIS — R197 Diarrhea, unspecified: Secondary | ICD-10-CM | POA: Diagnosis not present

## 2018-03-12 DIAGNOSIS — T82818A Embolism of vascular prosthetic devices, implants and grafts, initial encounter: Secondary | ICD-10-CM | POA: Diagnosis not present

## 2018-03-12 DIAGNOSIS — N2581 Secondary hyperparathyroidism of renal origin: Secondary | ICD-10-CM | POA: Diagnosis not present

## 2018-03-12 DIAGNOSIS — E1022 Type 1 diabetes mellitus with diabetic chronic kidney disease: Secondary | ICD-10-CM | POA: Diagnosis not present

## 2018-03-13 DIAGNOSIS — E103551 Type 1 diabetes mellitus with stable proliferative diabetic retinopathy, right eye: Secondary | ICD-10-CM | POA: Diagnosis not present

## 2018-03-13 DIAGNOSIS — E103532 Type 1 diabetes mellitus with proliferative diabetic retinopathy with traction retinal detachment not involving the macula, left eye: Secondary | ICD-10-CM | POA: Diagnosis not present

## 2018-03-13 DIAGNOSIS — H18423 Band keratopathy, bilateral: Secondary | ICD-10-CM | POA: Diagnosis not present

## 2018-03-13 DIAGNOSIS — Z794 Long term (current) use of insulin: Secondary | ICD-10-CM | POA: Diagnosis not present

## 2018-03-14 DIAGNOSIS — T82818A Embolism of vascular prosthetic devices, implants and grafts, initial encounter: Secondary | ICD-10-CM | POA: Diagnosis not present

## 2018-03-14 DIAGNOSIS — R197 Diarrhea, unspecified: Secondary | ICD-10-CM | POA: Diagnosis not present

## 2018-03-14 DIAGNOSIS — D631 Anemia in chronic kidney disease: Secondary | ICD-10-CM | POA: Diagnosis not present

## 2018-03-14 DIAGNOSIS — N186 End stage renal disease: Secondary | ICD-10-CM | POA: Diagnosis not present

## 2018-03-14 DIAGNOSIS — D509 Iron deficiency anemia, unspecified: Secondary | ICD-10-CM | POA: Diagnosis not present

## 2018-03-14 DIAGNOSIS — E1022 Type 1 diabetes mellitus with diabetic chronic kidney disease: Secondary | ICD-10-CM | POA: Diagnosis not present

## 2018-03-14 DIAGNOSIS — N2581 Secondary hyperparathyroidism of renal origin: Secondary | ICD-10-CM | POA: Diagnosis not present

## 2018-03-16 DIAGNOSIS — N2581 Secondary hyperparathyroidism of renal origin: Secondary | ICD-10-CM | POA: Diagnosis not present

## 2018-03-16 DIAGNOSIS — D631 Anemia in chronic kidney disease: Secondary | ICD-10-CM | POA: Diagnosis not present

## 2018-03-16 DIAGNOSIS — N186 End stage renal disease: Secondary | ICD-10-CM | POA: Diagnosis not present

## 2018-03-16 DIAGNOSIS — T82818A Embolism of vascular prosthetic devices, implants and grafts, initial encounter: Secondary | ICD-10-CM | POA: Diagnosis not present

## 2018-03-16 DIAGNOSIS — R197 Diarrhea, unspecified: Secondary | ICD-10-CM | POA: Diagnosis not present

## 2018-03-16 DIAGNOSIS — E1022 Type 1 diabetes mellitus with diabetic chronic kidney disease: Secondary | ICD-10-CM | POA: Diagnosis not present

## 2018-03-16 DIAGNOSIS — D509 Iron deficiency anemia, unspecified: Secondary | ICD-10-CM | POA: Diagnosis not present

## 2018-03-18 DIAGNOSIS — N186 End stage renal disease: Secondary | ICD-10-CM | POA: Diagnosis not present

## 2018-03-18 DIAGNOSIS — R197 Diarrhea, unspecified: Secondary | ICD-10-CM | POA: Diagnosis not present

## 2018-03-18 DIAGNOSIS — T82818A Embolism of vascular prosthetic devices, implants and grafts, initial encounter: Secondary | ICD-10-CM | POA: Diagnosis not present

## 2018-03-18 DIAGNOSIS — N2581 Secondary hyperparathyroidism of renal origin: Secondary | ICD-10-CM | POA: Diagnosis not present

## 2018-03-18 DIAGNOSIS — D509 Iron deficiency anemia, unspecified: Secondary | ICD-10-CM | POA: Diagnosis not present

## 2018-03-18 DIAGNOSIS — D631 Anemia in chronic kidney disease: Secondary | ICD-10-CM | POA: Diagnosis not present

## 2018-03-18 DIAGNOSIS — E1022 Type 1 diabetes mellitus with diabetic chronic kidney disease: Secondary | ICD-10-CM | POA: Diagnosis not present

## 2018-03-20 DIAGNOSIS — E1022 Type 1 diabetes mellitus with diabetic chronic kidney disease: Secondary | ICD-10-CM | POA: Diagnosis not present

## 2018-03-20 DIAGNOSIS — D631 Anemia in chronic kidney disease: Secondary | ICD-10-CM | POA: Diagnosis not present

## 2018-03-20 DIAGNOSIS — R197 Diarrhea, unspecified: Secondary | ICD-10-CM | POA: Diagnosis not present

## 2018-03-20 DIAGNOSIS — T82818A Embolism of vascular prosthetic devices, implants and grafts, initial encounter: Secondary | ICD-10-CM | POA: Diagnosis not present

## 2018-03-20 DIAGNOSIS — D509 Iron deficiency anemia, unspecified: Secondary | ICD-10-CM | POA: Diagnosis not present

## 2018-03-20 DIAGNOSIS — N186 End stage renal disease: Secondary | ICD-10-CM | POA: Diagnosis not present

## 2018-03-20 DIAGNOSIS — N2581 Secondary hyperparathyroidism of renal origin: Secondary | ICD-10-CM | POA: Diagnosis not present

## 2018-03-21 DIAGNOSIS — E1022 Type 1 diabetes mellitus with diabetic chronic kidney disease: Secondary | ICD-10-CM | POA: Diagnosis not present

## 2018-03-23 DIAGNOSIS — T82818A Embolism of vascular prosthetic devices, implants and grafts, initial encounter: Secondary | ICD-10-CM | POA: Diagnosis not present

## 2018-03-23 DIAGNOSIS — R197 Diarrhea, unspecified: Secondary | ICD-10-CM | POA: Diagnosis not present

## 2018-03-23 DIAGNOSIS — D631 Anemia in chronic kidney disease: Secondary | ICD-10-CM | POA: Diagnosis not present

## 2018-03-23 DIAGNOSIS — D509 Iron deficiency anemia, unspecified: Secondary | ICD-10-CM | POA: Diagnosis not present

## 2018-03-23 DIAGNOSIS — N2581 Secondary hyperparathyroidism of renal origin: Secondary | ICD-10-CM | POA: Diagnosis not present

## 2018-03-23 DIAGNOSIS — E1022 Type 1 diabetes mellitus with diabetic chronic kidney disease: Secondary | ICD-10-CM | POA: Diagnosis not present

## 2018-03-23 DIAGNOSIS — N186 End stage renal disease: Secondary | ICD-10-CM | POA: Diagnosis not present

## 2018-03-24 DIAGNOSIS — T8612 Kidney transplant failure: Secondary | ICD-10-CM | POA: Diagnosis not present

## 2018-03-24 DIAGNOSIS — N186 End stage renal disease: Secondary | ICD-10-CM | POA: Diagnosis not present

## 2018-03-24 DIAGNOSIS — Z992 Dependence on renal dialysis: Secondary | ICD-10-CM | POA: Diagnosis not present

## 2018-03-26 DIAGNOSIS — T82818A Embolism of vascular prosthetic devices, implants and grafts, initial encounter: Secondary | ICD-10-CM | POA: Diagnosis not present

## 2018-03-26 DIAGNOSIS — D631 Anemia in chronic kidney disease: Secondary | ICD-10-CM | POA: Diagnosis not present

## 2018-03-26 DIAGNOSIS — N186 End stage renal disease: Secondary | ICD-10-CM | POA: Diagnosis not present

## 2018-03-26 DIAGNOSIS — E1022 Type 1 diabetes mellitus with diabetic chronic kidney disease: Secondary | ICD-10-CM | POA: Diagnosis not present

## 2018-03-26 DIAGNOSIS — N2581 Secondary hyperparathyroidism of renal origin: Secondary | ICD-10-CM | POA: Diagnosis not present

## 2018-03-28 DIAGNOSIS — N2581 Secondary hyperparathyroidism of renal origin: Secondary | ICD-10-CM | POA: Diagnosis not present

## 2018-03-28 DIAGNOSIS — E1022 Type 1 diabetes mellitus with diabetic chronic kidney disease: Secondary | ICD-10-CM | POA: Diagnosis not present

## 2018-03-28 DIAGNOSIS — D631 Anemia in chronic kidney disease: Secondary | ICD-10-CM | POA: Diagnosis not present

## 2018-03-28 DIAGNOSIS — T82818A Embolism of vascular prosthetic devices, implants and grafts, initial encounter: Secondary | ICD-10-CM | POA: Diagnosis not present

## 2018-03-28 DIAGNOSIS — N186 End stage renal disease: Secondary | ICD-10-CM | POA: Diagnosis not present

## 2018-03-30 DIAGNOSIS — N186 End stage renal disease: Secondary | ICD-10-CM | POA: Diagnosis not present

## 2018-03-30 DIAGNOSIS — N2581 Secondary hyperparathyroidism of renal origin: Secondary | ICD-10-CM | POA: Diagnosis not present

## 2018-03-30 DIAGNOSIS — D631 Anemia in chronic kidney disease: Secondary | ICD-10-CM | POA: Diagnosis not present

## 2018-03-30 DIAGNOSIS — E1022 Type 1 diabetes mellitus with diabetic chronic kidney disease: Secondary | ICD-10-CM | POA: Diagnosis not present

## 2018-03-30 DIAGNOSIS — T82818A Embolism of vascular prosthetic devices, implants and grafts, initial encounter: Secondary | ICD-10-CM | POA: Diagnosis not present

## 2018-04-02 DIAGNOSIS — N186 End stage renal disease: Secondary | ICD-10-CM | POA: Diagnosis not present

## 2018-04-02 DIAGNOSIS — T82818A Embolism of vascular prosthetic devices, implants and grafts, initial encounter: Secondary | ICD-10-CM | POA: Diagnosis not present

## 2018-04-02 DIAGNOSIS — N2581 Secondary hyperparathyroidism of renal origin: Secondary | ICD-10-CM | POA: Diagnosis not present

## 2018-04-02 DIAGNOSIS — E1022 Type 1 diabetes mellitus with diabetic chronic kidney disease: Secondary | ICD-10-CM | POA: Diagnosis not present

## 2018-04-02 DIAGNOSIS — D631 Anemia in chronic kidney disease: Secondary | ICD-10-CM | POA: Diagnosis not present

## 2018-04-03 DIAGNOSIS — E108 Type 1 diabetes mellitus with unspecified complications: Secondary | ICD-10-CM | POA: Diagnosis not present

## 2018-04-03 DIAGNOSIS — M1711 Unilateral primary osteoarthritis, right knee: Secondary | ICD-10-CM | POA: Diagnosis not present

## 2018-04-03 DIAGNOSIS — E1059 Type 1 diabetes mellitus with other circulatory complications: Secondary | ICD-10-CM | POA: Diagnosis not present

## 2018-04-03 DIAGNOSIS — E1040 Type 1 diabetes mellitus with diabetic neuropathy, unspecified: Secondary | ICD-10-CM | POA: Diagnosis not present

## 2018-04-03 DIAGNOSIS — Z794 Long term (current) use of insulin: Secondary | ICD-10-CM | POA: Diagnosis not present

## 2018-04-04 DIAGNOSIS — D631 Anemia in chronic kidney disease: Secondary | ICD-10-CM | POA: Diagnosis not present

## 2018-04-04 DIAGNOSIS — E1022 Type 1 diabetes mellitus with diabetic chronic kidney disease: Secondary | ICD-10-CM | POA: Diagnosis not present

## 2018-04-04 DIAGNOSIS — N2581 Secondary hyperparathyroidism of renal origin: Secondary | ICD-10-CM | POA: Diagnosis not present

## 2018-04-04 DIAGNOSIS — N186 End stage renal disease: Secondary | ICD-10-CM | POA: Diagnosis not present

## 2018-04-04 DIAGNOSIS — T82818A Embolism of vascular prosthetic devices, implants and grafts, initial encounter: Secondary | ICD-10-CM | POA: Diagnosis not present

## 2018-04-06 DIAGNOSIS — N2581 Secondary hyperparathyroidism of renal origin: Secondary | ICD-10-CM | POA: Diagnosis not present

## 2018-04-06 DIAGNOSIS — E1022 Type 1 diabetes mellitus with diabetic chronic kidney disease: Secondary | ICD-10-CM | POA: Diagnosis not present

## 2018-04-06 DIAGNOSIS — D631 Anemia in chronic kidney disease: Secondary | ICD-10-CM | POA: Diagnosis not present

## 2018-04-06 DIAGNOSIS — T82818A Embolism of vascular prosthetic devices, implants and grafts, initial encounter: Secondary | ICD-10-CM | POA: Diagnosis not present

## 2018-04-06 DIAGNOSIS — N186 End stage renal disease: Secondary | ICD-10-CM | POA: Diagnosis not present

## 2018-04-09 DIAGNOSIS — N186 End stage renal disease: Secondary | ICD-10-CM | POA: Diagnosis not present

## 2018-04-09 DIAGNOSIS — E1022 Type 1 diabetes mellitus with diabetic chronic kidney disease: Secondary | ICD-10-CM | POA: Diagnosis not present

## 2018-04-09 DIAGNOSIS — N2581 Secondary hyperparathyroidism of renal origin: Secondary | ICD-10-CM | POA: Diagnosis not present

## 2018-04-09 DIAGNOSIS — T82818A Embolism of vascular prosthetic devices, implants and grafts, initial encounter: Secondary | ICD-10-CM | POA: Diagnosis not present

## 2018-04-09 DIAGNOSIS — D631 Anemia in chronic kidney disease: Secondary | ICD-10-CM | POA: Diagnosis not present

## 2018-04-11 DIAGNOSIS — T82818A Embolism of vascular prosthetic devices, implants and grafts, initial encounter: Secondary | ICD-10-CM | POA: Diagnosis not present

## 2018-04-11 DIAGNOSIS — N2581 Secondary hyperparathyroidism of renal origin: Secondary | ICD-10-CM | POA: Diagnosis not present

## 2018-04-11 DIAGNOSIS — N186 End stage renal disease: Secondary | ICD-10-CM | POA: Diagnosis not present

## 2018-04-11 DIAGNOSIS — E1022 Type 1 diabetes mellitus with diabetic chronic kidney disease: Secondary | ICD-10-CM | POA: Diagnosis not present

## 2018-04-11 DIAGNOSIS — D631 Anemia in chronic kidney disease: Secondary | ICD-10-CM | POA: Diagnosis not present

## 2018-04-13 DIAGNOSIS — N2581 Secondary hyperparathyroidism of renal origin: Secondary | ICD-10-CM | POA: Diagnosis not present

## 2018-04-13 DIAGNOSIS — E1022 Type 1 diabetes mellitus with diabetic chronic kidney disease: Secondary | ICD-10-CM | POA: Diagnosis not present

## 2018-04-13 DIAGNOSIS — N186 End stage renal disease: Secondary | ICD-10-CM | POA: Diagnosis not present

## 2018-04-13 DIAGNOSIS — T82818A Embolism of vascular prosthetic devices, implants and grafts, initial encounter: Secondary | ICD-10-CM | POA: Diagnosis not present

## 2018-04-13 DIAGNOSIS — D631 Anemia in chronic kidney disease: Secondary | ICD-10-CM | POA: Diagnosis not present

## 2018-04-15 DIAGNOSIS — E1022 Type 1 diabetes mellitus with diabetic chronic kidney disease: Secondary | ICD-10-CM | POA: Diagnosis not present

## 2018-04-15 DIAGNOSIS — N186 End stage renal disease: Secondary | ICD-10-CM | POA: Diagnosis not present

## 2018-04-15 DIAGNOSIS — T82818A Embolism of vascular prosthetic devices, implants and grafts, initial encounter: Secondary | ICD-10-CM | POA: Diagnosis not present

## 2018-04-15 DIAGNOSIS — N2581 Secondary hyperparathyroidism of renal origin: Secondary | ICD-10-CM | POA: Diagnosis not present

## 2018-04-15 DIAGNOSIS — D631 Anemia in chronic kidney disease: Secondary | ICD-10-CM | POA: Diagnosis not present

## 2018-04-17 DIAGNOSIS — D631 Anemia in chronic kidney disease: Secondary | ICD-10-CM | POA: Diagnosis not present

## 2018-04-17 DIAGNOSIS — T82818A Embolism of vascular prosthetic devices, implants and grafts, initial encounter: Secondary | ICD-10-CM | POA: Diagnosis not present

## 2018-04-17 DIAGNOSIS — N186 End stage renal disease: Secondary | ICD-10-CM | POA: Diagnosis not present

## 2018-04-17 DIAGNOSIS — N2581 Secondary hyperparathyroidism of renal origin: Secondary | ICD-10-CM | POA: Diagnosis not present

## 2018-04-17 DIAGNOSIS — E1022 Type 1 diabetes mellitus with diabetic chronic kidney disease: Secondary | ICD-10-CM | POA: Diagnosis not present

## 2018-04-20 DIAGNOSIS — E1022 Type 1 diabetes mellitus with diabetic chronic kidney disease: Secondary | ICD-10-CM | POA: Diagnosis not present

## 2018-04-20 DIAGNOSIS — N2581 Secondary hyperparathyroidism of renal origin: Secondary | ICD-10-CM | POA: Diagnosis not present

## 2018-04-20 DIAGNOSIS — T82818A Embolism of vascular prosthetic devices, implants and grafts, initial encounter: Secondary | ICD-10-CM | POA: Diagnosis not present

## 2018-04-20 DIAGNOSIS — D631 Anemia in chronic kidney disease: Secondary | ICD-10-CM | POA: Diagnosis not present

## 2018-04-20 DIAGNOSIS — N186 End stage renal disease: Secondary | ICD-10-CM | POA: Diagnosis not present

## 2018-04-21 DIAGNOSIS — E1022 Type 1 diabetes mellitus with diabetic chronic kidney disease: Secondary | ICD-10-CM | POA: Diagnosis not present

## 2018-04-22 DIAGNOSIS — D631 Anemia in chronic kidney disease: Secondary | ICD-10-CM | POA: Diagnosis not present

## 2018-04-22 DIAGNOSIS — T82818A Embolism of vascular prosthetic devices, implants and grafts, initial encounter: Secondary | ICD-10-CM | POA: Diagnosis not present

## 2018-04-22 DIAGNOSIS — E1022 Type 1 diabetes mellitus with diabetic chronic kidney disease: Secondary | ICD-10-CM | POA: Diagnosis not present

## 2018-04-22 DIAGNOSIS — N2581 Secondary hyperparathyroidism of renal origin: Secondary | ICD-10-CM | POA: Diagnosis not present

## 2018-04-22 DIAGNOSIS — N186 End stage renal disease: Secondary | ICD-10-CM | POA: Diagnosis not present

## 2018-04-24 DIAGNOSIS — D631 Anemia in chronic kidney disease: Secondary | ICD-10-CM | POA: Diagnosis not present

## 2018-04-24 DIAGNOSIS — T8612 Kidney transplant failure: Secondary | ICD-10-CM | POA: Diagnosis not present

## 2018-04-24 DIAGNOSIS — T82818A Embolism of vascular prosthetic devices, implants and grafts, initial encounter: Secondary | ICD-10-CM | POA: Diagnosis not present

## 2018-04-24 DIAGNOSIS — Z992 Dependence on renal dialysis: Secondary | ICD-10-CM | POA: Diagnosis not present

## 2018-04-24 DIAGNOSIS — E1022 Type 1 diabetes mellitus with diabetic chronic kidney disease: Secondary | ICD-10-CM | POA: Diagnosis not present

## 2018-04-24 DIAGNOSIS — N2581 Secondary hyperparathyroidism of renal origin: Secondary | ICD-10-CM | POA: Diagnosis not present

## 2018-04-24 DIAGNOSIS — N186 End stage renal disease: Secondary | ICD-10-CM | POA: Diagnosis not present

## 2018-04-27 DIAGNOSIS — E162 Hypoglycemia, unspecified: Secondary | ICD-10-CM | POA: Diagnosis not present

## 2018-04-27 DIAGNOSIS — T82818A Embolism of vascular prosthetic devices, implants and grafts, initial encounter: Secondary | ICD-10-CM | POA: Diagnosis not present

## 2018-04-27 DIAGNOSIS — E1022 Type 1 diabetes mellitus with diabetic chronic kidney disease: Secondary | ICD-10-CM | POA: Diagnosis not present

## 2018-04-27 DIAGNOSIS — N2581 Secondary hyperparathyroidism of renal origin: Secondary | ICD-10-CM | POA: Diagnosis not present

## 2018-04-27 DIAGNOSIS — D631 Anemia in chronic kidney disease: Secondary | ICD-10-CM | POA: Diagnosis not present

## 2018-04-27 DIAGNOSIS — N186 End stage renal disease: Secondary | ICD-10-CM | POA: Diagnosis not present

## 2018-04-30 DIAGNOSIS — N2581 Secondary hyperparathyroidism of renal origin: Secondary | ICD-10-CM | POA: Diagnosis not present

## 2018-04-30 DIAGNOSIS — E162 Hypoglycemia, unspecified: Secondary | ICD-10-CM | POA: Diagnosis not present

## 2018-04-30 DIAGNOSIS — N186 End stage renal disease: Secondary | ICD-10-CM | POA: Diagnosis not present

## 2018-04-30 DIAGNOSIS — D631 Anemia in chronic kidney disease: Secondary | ICD-10-CM | POA: Diagnosis not present

## 2018-04-30 DIAGNOSIS — E1022 Type 1 diabetes mellitus with diabetic chronic kidney disease: Secondary | ICD-10-CM | POA: Diagnosis not present

## 2018-04-30 DIAGNOSIS — T82818A Embolism of vascular prosthetic devices, implants and grafts, initial encounter: Secondary | ICD-10-CM | POA: Diagnosis not present

## 2018-05-02 DIAGNOSIS — T82818A Embolism of vascular prosthetic devices, implants and grafts, initial encounter: Secondary | ICD-10-CM | POA: Diagnosis not present

## 2018-05-02 DIAGNOSIS — E1022 Type 1 diabetes mellitus with diabetic chronic kidney disease: Secondary | ICD-10-CM | POA: Diagnosis not present

## 2018-05-02 DIAGNOSIS — N186 End stage renal disease: Secondary | ICD-10-CM | POA: Diagnosis not present

## 2018-05-02 DIAGNOSIS — E162 Hypoglycemia, unspecified: Secondary | ICD-10-CM | POA: Diagnosis not present

## 2018-05-02 DIAGNOSIS — D631 Anemia in chronic kidney disease: Secondary | ICD-10-CM | POA: Diagnosis not present

## 2018-05-02 DIAGNOSIS — N2581 Secondary hyperparathyroidism of renal origin: Secondary | ICD-10-CM | POA: Diagnosis not present

## 2018-05-04 DIAGNOSIS — N186 End stage renal disease: Secondary | ICD-10-CM | POA: Diagnosis not present

## 2018-05-04 DIAGNOSIS — E162 Hypoglycemia, unspecified: Secondary | ICD-10-CM | POA: Diagnosis not present

## 2018-05-04 DIAGNOSIS — T82818A Embolism of vascular prosthetic devices, implants and grafts, initial encounter: Secondary | ICD-10-CM | POA: Diagnosis not present

## 2018-05-04 DIAGNOSIS — N2581 Secondary hyperparathyroidism of renal origin: Secondary | ICD-10-CM | POA: Diagnosis not present

## 2018-05-04 DIAGNOSIS — D631 Anemia in chronic kidney disease: Secondary | ICD-10-CM | POA: Diagnosis not present

## 2018-05-04 DIAGNOSIS — E1022 Type 1 diabetes mellitus with diabetic chronic kidney disease: Secondary | ICD-10-CM | POA: Diagnosis not present

## 2018-05-07 DIAGNOSIS — N2581 Secondary hyperparathyroidism of renal origin: Secondary | ICD-10-CM | POA: Diagnosis not present

## 2018-05-07 DIAGNOSIS — E162 Hypoglycemia, unspecified: Secondary | ICD-10-CM | POA: Diagnosis not present

## 2018-05-07 DIAGNOSIS — T82818A Embolism of vascular prosthetic devices, implants and grafts, initial encounter: Secondary | ICD-10-CM | POA: Diagnosis not present

## 2018-05-07 DIAGNOSIS — D631 Anemia in chronic kidney disease: Secondary | ICD-10-CM | POA: Diagnosis not present

## 2018-05-07 DIAGNOSIS — E1022 Type 1 diabetes mellitus with diabetic chronic kidney disease: Secondary | ICD-10-CM | POA: Diagnosis not present

## 2018-05-07 DIAGNOSIS — N186 End stage renal disease: Secondary | ICD-10-CM | POA: Diagnosis not present

## 2018-05-08 ENCOUNTER — Other Ambulatory Visit: Payer: Self-pay

## 2018-05-08 DIAGNOSIS — E103593 Type 1 diabetes mellitus with proliferative diabetic retinopathy without macular edema, bilateral: Secondary | ICD-10-CM | POA: Diagnosis not present

## 2018-05-08 DIAGNOSIS — E782 Mixed hyperlipidemia: Secondary | ICD-10-CM | POA: Diagnosis not present

## 2018-05-08 DIAGNOSIS — E291 Testicular hypofunction: Secondary | ICD-10-CM | POA: Diagnosis not present

## 2018-05-08 DIAGNOSIS — E039 Hypothyroidism, unspecified: Secondary | ICD-10-CM | POA: Diagnosis not present

## 2018-05-08 MED ORDER — ALLOPURINOL 100 MG PO TABS: 200.0000 mg | ORAL_TABLET | Freq: Every day | ORAL | 3 refills | Status: DC

## 2018-05-09 DIAGNOSIS — E1022 Type 1 diabetes mellitus with diabetic chronic kidney disease: Secondary | ICD-10-CM | POA: Diagnosis not present

## 2018-05-09 DIAGNOSIS — N2581 Secondary hyperparathyroidism of renal origin: Secondary | ICD-10-CM | POA: Diagnosis not present

## 2018-05-09 DIAGNOSIS — T82818A Embolism of vascular prosthetic devices, implants and grafts, initial encounter: Secondary | ICD-10-CM | POA: Diagnosis not present

## 2018-05-09 DIAGNOSIS — N186 End stage renal disease: Secondary | ICD-10-CM | POA: Diagnosis not present

## 2018-05-09 DIAGNOSIS — D631 Anemia in chronic kidney disease: Secondary | ICD-10-CM | POA: Diagnosis not present

## 2018-05-09 DIAGNOSIS — E162 Hypoglycemia, unspecified: Secondary | ICD-10-CM | POA: Diagnosis not present

## 2018-05-11 DIAGNOSIS — N186 End stage renal disease: Secondary | ICD-10-CM | POA: Diagnosis not present

## 2018-05-11 DIAGNOSIS — T82818A Embolism of vascular prosthetic devices, implants and grafts, initial encounter: Secondary | ICD-10-CM | POA: Diagnosis not present

## 2018-05-11 DIAGNOSIS — E162 Hypoglycemia, unspecified: Secondary | ICD-10-CM | POA: Diagnosis not present

## 2018-05-11 DIAGNOSIS — D631 Anemia in chronic kidney disease: Secondary | ICD-10-CM | POA: Diagnosis not present

## 2018-05-11 DIAGNOSIS — N2581 Secondary hyperparathyroidism of renal origin: Secondary | ICD-10-CM | POA: Diagnosis not present

## 2018-05-11 DIAGNOSIS — E1022 Type 1 diabetes mellitus with diabetic chronic kidney disease: Secondary | ICD-10-CM | POA: Diagnosis not present

## 2018-05-14 DIAGNOSIS — D631 Anemia in chronic kidney disease: Secondary | ICD-10-CM | POA: Diagnosis not present

## 2018-05-14 DIAGNOSIS — N186 End stage renal disease: Secondary | ICD-10-CM | POA: Diagnosis not present

## 2018-05-14 DIAGNOSIS — T82818A Embolism of vascular prosthetic devices, implants and grafts, initial encounter: Secondary | ICD-10-CM | POA: Diagnosis not present

## 2018-05-14 DIAGNOSIS — N2581 Secondary hyperparathyroidism of renal origin: Secondary | ICD-10-CM | POA: Diagnosis not present

## 2018-05-14 DIAGNOSIS — E162 Hypoglycemia, unspecified: Secondary | ICD-10-CM | POA: Diagnosis not present

## 2018-05-14 DIAGNOSIS — E1022 Type 1 diabetes mellitus with diabetic chronic kidney disease: Secondary | ICD-10-CM | POA: Diagnosis not present

## 2018-05-16 DIAGNOSIS — D631 Anemia in chronic kidney disease: Secondary | ICD-10-CM | POA: Diagnosis not present

## 2018-05-16 DIAGNOSIS — T82818A Embolism of vascular prosthetic devices, implants and grafts, initial encounter: Secondary | ICD-10-CM | POA: Diagnosis not present

## 2018-05-16 DIAGNOSIS — N186 End stage renal disease: Secondary | ICD-10-CM | POA: Diagnosis not present

## 2018-05-16 DIAGNOSIS — E1022 Type 1 diabetes mellitus with diabetic chronic kidney disease: Secondary | ICD-10-CM | POA: Diagnosis not present

## 2018-05-16 DIAGNOSIS — N2581 Secondary hyperparathyroidism of renal origin: Secondary | ICD-10-CM | POA: Diagnosis not present

## 2018-05-16 DIAGNOSIS — E162 Hypoglycemia, unspecified: Secondary | ICD-10-CM | POA: Diagnosis not present

## 2018-05-18 DIAGNOSIS — N2581 Secondary hyperparathyroidism of renal origin: Secondary | ICD-10-CM | POA: Diagnosis not present

## 2018-05-18 DIAGNOSIS — E1022 Type 1 diabetes mellitus with diabetic chronic kidney disease: Secondary | ICD-10-CM | POA: Diagnosis not present

## 2018-05-18 DIAGNOSIS — E162 Hypoglycemia, unspecified: Secondary | ICD-10-CM | POA: Diagnosis not present

## 2018-05-18 DIAGNOSIS — T82818A Embolism of vascular prosthetic devices, implants and grafts, initial encounter: Secondary | ICD-10-CM | POA: Diagnosis not present

## 2018-05-18 DIAGNOSIS — N186 End stage renal disease: Secondary | ICD-10-CM | POA: Diagnosis not present

## 2018-05-18 DIAGNOSIS — D631 Anemia in chronic kidney disease: Secondary | ICD-10-CM | POA: Diagnosis not present

## 2018-05-21 DIAGNOSIS — D631 Anemia in chronic kidney disease: Secondary | ICD-10-CM | POA: Diagnosis not present

## 2018-05-21 DIAGNOSIS — E1022 Type 1 diabetes mellitus with diabetic chronic kidney disease: Secondary | ICD-10-CM | POA: Diagnosis not present

## 2018-05-21 DIAGNOSIS — N186 End stage renal disease: Secondary | ICD-10-CM | POA: Diagnosis not present

## 2018-05-21 DIAGNOSIS — E162 Hypoglycemia, unspecified: Secondary | ICD-10-CM | POA: Diagnosis not present

## 2018-05-21 DIAGNOSIS — N2581 Secondary hyperparathyroidism of renal origin: Secondary | ICD-10-CM | POA: Diagnosis not present

## 2018-05-21 DIAGNOSIS — T82818A Embolism of vascular prosthetic devices, implants and grafts, initial encounter: Secondary | ICD-10-CM | POA: Diagnosis not present

## 2018-05-23 DIAGNOSIS — E162 Hypoglycemia, unspecified: Secondary | ICD-10-CM | POA: Diagnosis not present

## 2018-05-23 DIAGNOSIS — N186 End stage renal disease: Secondary | ICD-10-CM | POA: Diagnosis not present

## 2018-05-23 DIAGNOSIS — T82818A Embolism of vascular prosthetic devices, implants and grafts, initial encounter: Secondary | ICD-10-CM | POA: Diagnosis not present

## 2018-05-23 DIAGNOSIS — D631 Anemia in chronic kidney disease: Secondary | ICD-10-CM | POA: Diagnosis not present

## 2018-05-23 DIAGNOSIS — N2581 Secondary hyperparathyroidism of renal origin: Secondary | ICD-10-CM | POA: Diagnosis not present

## 2018-05-23 DIAGNOSIS — E1022 Type 1 diabetes mellitus with diabetic chronic kidney disease: Secondary | ICD-10-CM | POA: Diagnosis not present

## 2018-05-25 DIAGNOSIS — N2581 Secondary hyperparathyroidism of renal origin: Secondary | ICD-10-CM | POA: Diagnosis not present

## 2018-05-25 DIAGNOSIS — N186 End stage renal disease: Secondary | ICD-10-CM | POA: Diagnosis not present

## 2018-05-25 DIAGNOSIS — T82818A Embolism of vascular prosthetic devices, implants and grafts, initial encounter: Secondary | ICD-10-CM | POA: Diagnosis not present

## 2018-05-25 DIAGNOSIS — E1022 Type 1 diabetes mellitus with diabetic chronic kidney disease: Secondary | ICD-10-CM | POA: Diagnosis not present

## 2018-05-25 DIAGNOSIS — Z992 Dependence on renal dialysis: Secondary | ICD-10-CM | POA: Diagnosis not present

## 2018-05-25 DIAGNOSIS — E162 Hypoglycemia, unspecified: Secondary | ICD-10-CM | POA: Diagnosis not present

## 2018-05-25 DIAGNOSIS — T8612 Kidney transplant failure: Secondary | ICD-10-CM | POA: Diagnosis not present

## 2018-05-25 DIAGNOSIS — D631 Anemia in chronic kidney disease: Secondary | ICD-10-CM | POA: Diagnosis not present

## 2018-05-28 DIAGNOSIS — N186 End stage renal disease: Secondary | ICD-10-CM | POA: Diagnosis not present

## 2018-05-28 DIAGNOSIS — D631 Anemia in chronic kidney disease: Secondary | ICD-10-CM | POA: Diagnosis not present

## 2018-05-28 DIAGNOSIS — E1022 Type 1 diabetes mellitus with diabetic chronic kidney disease: Secondary | ICD-10-CM | POA: Diagnosis not present

## 2018-05-28 DIAGNOSIS — N2581 Secondary hyperparathyroidism of renal origin: Secondary | ICD-10-CM | POA: Diagnosis not present

## 2018-05-28 DIAGNOSIS — T82818A Embolism of vascular prosthetic devices, implants and grafts, initial encounter: Secondary | ICD-10-CM | POA: Diagnosis not present

## 2018-05-30 DIAGNOSIS — D631 Anemia in chronic kidney disease: Secondary | ICD-10-CM | POA: Diagnosis not present

## 2018-05-30 DIAGNOSIS — T82818A Embolism of vascular prosthetic devices, implants and grafts, initial encounter: Secondary | ICD-10-CM | POA: Diagnosis not present

## 2018-05-30 DIAGNOSIS — N186 End stage renal disease: Secondary | ICD-10-CM | POA: Diagnosis not present

## 2018-05-30 DIAGNOSIS — E1022 Type 1 diabetes mellitus with diabetic chronic kidney disease: Secondary | ICD-10-CM | POA: Diagnosis not present

## 2018-05-30 DIAGNOSIS — N2581 Secondary hyperparathyroidism of renal origin: Secondary | ICD-10-CM | POA: Diagnosis not present

## 2018-05-30 DIAGNOSIS — I872 Venous insufficiency (chronic) (peripheral): Secondary | ICD-10-CM | POA: Diagnosis not present

## 2018-06-01 DIAGNOSIS — N2581 Secondary hyperparathyroidism of renal origin: Secondary | ICD-10-CM | POA: Diagnosis not present

## 2018-06-01 DIAGNOSIS — N186 End stage renal disease: Secondary | ICD-10-CM | POA: Diagnosis not present

## 2018-06-01 DIAGNOSIS — T82818A Embolism of vascular prosthetic devices, implants and grafts, initial encounter: Secondary | ICD-10-CM | POA: Diagnosis not present

## 2018-06-01 DIAGNOSIS — D631 Anemia in chronic kidney disease: Secondary | ICD-10-CM | POA: Diagnosis not present

## 2018-06-01 DIAGNOSIS — E1022 Type 1 diabetes mellitus with diabetic chronic kidney disease: Secondary | ICD-10-CM | POA: Diagnosis not present

## 2018-06-04 DIAGNOSIS — E1022 Type 1 diabetes mellitus with diabetic chronic kidney disease: Secondary | ICD-10-CM | POA: Diagnosis not present

## 2018-06-04 DIAGNOSIS — T82818A Embolism of vascular prosthetic devices, implants and grafts, initial encounter: Secondary | ICD-10-CM | POA: Diagnosis not present

## 2018-06-04 DIAGNOSIS — D631 Anemia in chronic kidney disease: Secondary | ICD-10-CM | POA: Diagnosis not present

## 2018-06-04 DIAGNOSIS — N2581 Secondary hyperparathyroidism of renal origin: Secondary | ICD-10-CM | POA: Diagnosis not present

## 2018-06-04 DIAGNOSIS — N186 End stage renal disease: Secondary | ICD-10-CM | POA: Diagnosis not present

## 2018-06-05 DIAGNOSIS — E291 Testicular hypofunction: Secondary | ICD-10-CM | POA: Diagnosis not present

## 2018-06-05 DIAGNOSIS — E103593 Type 1 diabetes mellitus with proliferative diabetic retinopathy without macular edema, bilateral: Secondary | ICD-10-CM | POA: Diagnosis not present

## 2018-06-06 DIAGNOSIS — T82818A Embolism of vascular prosthetic devices, implants and grafts, initial encounter: Secondary | ICD-10-CM | POA: Diagnosis not present

## 2018-06-06 DIAGNOSIS — N2581 Secondary hyperparathyroidism of renal origin: Secondary | ICD-10-CM | POA: Diagnosis not present

## 2018-06-06 DIAGNOSIS — E1022 Type 1 diabetes mellitus with diabetic chronic kidney disease: Secondary | ICD-10-CM | POA: Diagnosis not present

## 2018-06-06 DIAGNOSIS — D631 Anemia in chronic kidney disease: Secondary | ICD-10-CM | POA: Diagnosis not present

## 2018-06-06 DIAGNOSIS — N186 End stage renal disease: Secondary | ICD-10-CM | POA: Diagnosis not present

## 2018-06-08 DIAGNOSIS — E1022 Type 1 diabetes mellitus with diabetic chronic kidney disease: Secondary | ICD-10-CM | POA: Diagnosis not present

## 2018-06-08 DIAGNOSIS — T82818A Embolism of vascular prosthetic devices, implants and grafts, initial encounter: Secondary | ICD-10-CM | POA: Diagnosis not present

## 2018-06-08 DIAGNOSIS — N186 End stage renal disease: Secondary | ICD-10-CM | POA: Diagnosis not present

## 2018-06-08 DIAGNOSIS — N2581 Secondary hyperparathyroidism of renal origin: Secondary | ICD-10-CM | POA: Diagnosis not present

## 2018-06-08 DIAGNOSIS — D631 Anemia in chronic kidney disease: Secondary | ICD-10-CM | POA: Diagnosis not present

## 2018-06-11 DIAGNOSIS — N186 End stage renal disease: Secondary | ICD-10-CM | POA: Diagnosis not present

## 2018-06-11 DIAGNOSIS — E1022 Type 1 diabetes mellitus with diabetic chronic kidney disease: Secondary | ICD-10-CM | POA: Diagnosis not present

## 2018-06-11 DIAGNOSIS — N2581 Secondary hyperparathyroidism of renal origin: Secondary | ICD-10-CM | POA: Diagnosis not present

## 2018-06-11 DIAGNOSIS — D631 Anemia in chronic kidney disease: Secondary | ICD-10-CM | POA: Diagnosis not present

## 2018-06-11 DIAGNOSIS — T82818A Embolism of vascular prosthetic devices, implants and grafts, initial encounter: Secondary | ICD-10-CM | POA: Diagnosis not present

## 2018-06-13 DIAGNOSIS — N186 End stage renal disease: Secondary | ICD-10-CM | POA: Diagnosis not present

## 2018-06-13 DIAGNOSIS — D631 Anemia in chronic kidney disease: Secondary | ICD-10-CM | POA: Diagnosis not present

## 2018-06-13 DIAGNOSIS — T82818A Embolism of vascular prosthetic devices, implants and grafts, initial encounter: Secondary | ICD-10-CM | POA: Diagnosis not present

## 2018-06-13 DIAGNOSIS — N2581 Secondary hyperparathyroidism of renal origin: Secondary | ICD-10-CM | POA: Diagnosis not present

## 2018-06-13 DIAGNOSIS — E1022 Type 1 diabetes mellitus with diabetic chronic kidney disease: Secondary | ICD-10-CM | POA: Diagnosis not present

## 2018-06-15 DIAGNOSIS — D631 Anemia in chronic kidney disease: Secondary | ICD-10-CM | POA: Diagnosis not present

## 2018-06-15 DIAGNOSIS — N186 End stage renal disease: Secondary | ICD-10-CM | POA: Diagnosis not present

## 2018-06-15 DIAGNOSIS — N2581 Secondary hyperparathyroidism of renal origin: Secondary | ICD-10-CM | POA: Diagnosis not present

## 2018-06-15 DIAGNOSIS — E1022 Type 1 diabetes mellitus with diabetic chronic kidney disease: Secondary | ICD-10-CM | POA: Diagnosis not present

## 2018-06-15 DIAGNOSIS — T82818A Embolism of vascular prosthetic devices, implants and grafts, initial encounter: Secondary | ICD-10-CM | POA: Diagnosis not present

## 2018-06-18 DIAGNOSIS — D631 Anemia in chronic kidney disease: Secondary | ICD-10-CM | POA: Diagnosis not present

## 2018-06-18 DIAGNOSIS — E1022 Type 1 diabetes mellitus with diabetic chronic kidney disease: Secondary | ICD-10-CM | POA: Diagnosis not present

## 2018-06-18 DIAGNOSIS — N2581 Secondary hyperparathyroidism of renal origin: Secondary | ICD-10-CM | POA: Diagnosis not present

## 2018-06-18 DIAGNOSIS — N186 End stage renal disease: Secondary | ICD-10-CM | POA: Diagnosis not present

## 2018-06-18 DIAGNOSIS — T82818A Embolism of vascular prosthetic devices, implants and grafts, initial encounter: Secondary | ICD-10-CM | POA: Diagnosis not present

## 2018-06-20 DIAGNOSIS — N186 End stage renal disease: Secondary | ICD-10-CM | POA: Diagnosis not present

## 2018-06-20 DIAGNOSIS — E1022 Type 1 diabetes mellitus with diabetic chronic kidney disease: Secondary | ICD-10-CM | POA: Diagnosis not present

## 2018-06-20 DIAGNOSIS — T82818A Embolism of vascular prosthetic devices, implants and grafts, initial encounter: Secondary | ICD-10-CM | POA: Diagnosis not present

## 2018-06-20 DIAGNOSIS — N2581 Secondary hyperparathyroidism of renal origin: Secondary | ICD-10-CM | POA: Diagnosis not present

## 2018-06-20 DIAGNOSIS — D631 Anemia in chronic kidney disease: Secondary | ICD-10-CM | POA: Diagnosis not present

## 2018-06-22 DIAGNOSIS — D631 Anemia in chronic kidney disease: Secondary | ICD-10-CM | POA: Diagnosis not present

## 2018-06-22 DIAGNOSIS — E1022 Type 1 diabetes mellitus with diabetic chronic kidney disease: Secondary | ICD-10-CM | POA: Diagnosis not present

## 2018-06-22 DIAGNOSIS — T82818A Embolism of vascular prosthetic devices, implants and grafts, initial encounter: Secondary | ICD-10-CM | POA: Diagnosis not present

## 2018-06-22 DIAGNOSIS — N2581 Secondary hyperparathyroidism of renal origin: Secondary | ICD-10-CM | POA: Diagnosis not present

## 2018-06-22 DIAGNOSIS — N186 End stage renal disease: Secondary | ICD-10-CM | POA: Diagnosis not present

## 2018-06-23 DIAGNOSIS — N186 End stage renal disease: Secondary | ICD-10-CM | POA: Diagnosis not present

## 2018-06-23 DIAGNOSIS — T8612 Kidney transplant failure: Secondary | ICD-10-CM | POA: Diagnosis not present

## 2018-06-23 DIAGNOSIS — Z992 Dependence on renal dialysis: Secondary | ICD-10-CM | POA: Diagnosis not present

## 2018-06-25 DIAGNOSIS — R197 Diarrhea, unspecified: Secondary | ICD-10-CM | POA: Diagnosis not present

## 2018-06-25 DIAGNOSIS — D631 Anemia in chronic kidney disease: Secondary | ICD-10-CM | POA: Diagnosis not present

## 2018-06-25 DIAGNOSIS — E1022 Type 1 diabetes mellitus with diabetic chronic kidney disease: Secondary | ICD-10-CM | POA: Diagnosis not present

## 2018-06-25 DIAGNOSIS — N2581 Secondary hyperparathyroidism of renal origin: Secondary | ICD-10-CM | POA: Diagnosis not present

## 2018-06-25 DIAGNOSIS — N186 End stage renal disease: Secondary | ICD-10-CM | POA: Diagnosis not present

## 2018-06-25 DIAGNOSIS — T82818A Embolism of vascular prosthetic devices, implants and grafts, initial encounter: Secondary | ICD-10-CM | POA: Diagnosis not present

## 2018-06-25 DIAGNOSIS — E8770 Fluid overload, unspecified: Secondary | ICD-10-CM | POA: Diagnosis not present

## 2018-06-27 DIAGNOSIS — N2581 Secondary hyperparathyroidism of renal origin: Secondary | ICD-10-CM | POA: Diagnosis not present

## 2018-06-27 DIAGNOSIS — R197 Diarrhea, unspecified: Secondary | ICD-10-CM | POA: Diagnosis not present

## 2018-06-27 DIAGNOSIS — T82818A Embolism of vascular prosthetic devices, implants and grafts, initial encounter: Secondary | ICD-10-CM | POA: Diagnosis not present

## 2018-06-27 DIAGNOSIS — N186 End stage renal disease: Secondary | ICD-10-CM | POA: Diagnosis not present

## 2018-06-27 DIAGNOSIS — E1022 Type 1 diabetes mellitus with diabetic chronic kidney disease: Secondary | ICD-10-CM | POA: Diagnosis not present

## 2018-06-27 DIAGNOSIS — D631 Anemia in chronic kidney disease: Secondary | ICD-10-CM | POA: Diagnosis not present

## 2018-06-27 DIAGNOSIS — E8770 Fluid overload, unspecified: Secondary | ICD-10-CM | POA: Diagnosis not present

## 2018-06-30 DIAGNOSIS — D631 Anemia in chronic kidney disease: Secondary | ICD-10-CM | POA: Diagnosis not present

## 2018-06-30 DIAGNOSIS — N2581 Secondary hyperparathyroidism of renal origin: Secondary | ICD-10-CM | POA: Diagnosis not present

## 2018-06-30 DIAGNOSIS — E1022 Type 1 diabetes mellitus with diabetic chronic kidney disease: Secondary | ICD-10-CM | POA: Diagnosis not present

## 2018-06-30 DIAGNOSIS — N186 End stage renal disease: Secondary | ICD-10-CM | POA: Diagnosis not present

## 2018-06-30 DIAGNOSIS — T82818A Embolism of vascular prosthetic devices, implants and grafts, initial encounter: Secondary | ICD-10-CM | POA: Diagnosis not present

## 2018-06-30 DIAGNOSIS — E8770 Fluid overload, unspecified: Secondary | ICD-10-CM | POA: Diagnosis not present

## 2018-06-30 DIAGNOSIS — R197 Diarrhea, unspecified: Secondary | ICD-10-CM | POA: Diagnosis not present

## 2018-07-02 DIAGNOSIS — E8770 Fluid overload, unspecified: Secondary | ICD-10-CM | POA: Diagnosis not present

## 2018-07-02 DIAGNOSIS — D631 Anemia in chronic kidney disease: Secondary | ICD-10-CM | POA: Diagnosis not present

## 2018-07-02 DIAGNOSIS — T82818A Embolism of vascular prosthetic devices, implants and grafts, initial encounter: Secondary | ICD-10-CM | POA: Diagnosis not present

## 2018-07-02 DIAGNOSIS — N186 End stage renal disease: Secondary | ICD-10-CM | POA: Diagnosis not present

## 2018-07-02 DIAGNOSIS — E1022 Type 1 diabetes mellitus with diabetic chronic kidney disease: Secondary | ICD-10-CM | POA: Diagnosis not present

## 2018-07-02 DIAGNOSIS — N2581 Secondary hyperparathyroidism of renal origin: Secondary | ICD-10-CM | POA: Diagnosis not present

## 2018-07-02 DIAGNOSIS — R197 Diarrhea, unspecified: Secondary | ICD-10-CM | POA: Diagnosis not present

## 2018-07-03 DIAGNOSIS — M25562 Pain in left knee: Secondary | ICD-10-CM | POA: Diagnosis not present

## 2018-07-03 DIAGNOSIS — M1711 Unilateral primary osteoarthritis, right knee: Secondary | ICD-10-CM | POA: Diagnosis not present

## 2018-07-04 DIAGNOSIS — E8770 Fluid overload, unspecified: Secondary | ICD-10-CM | POA: Diagnosis not present

## 2018-07-04 DIAGNOSIS — N186 End stage renal disease: Secondary | ICD-10-CM | POA: Diagnosis not present

## 2018-07-04 DIAGNOSIS — T82818A Embolism of vascular prosthetic devices, implants and grafts, initial encounter: Secondary | ICD-10-CM | POA: Diagnosis not present

## 2018-07-04 DIAGNOSIS — N2581 Secondary hyperparathyroidism of renal origin: Secondary | ICD-10-CM | POA: Diagnosis not present

## 2018-07-04 DIAGNOSIS — D631 Anemia in chronic kidney disease: Secondary | ICD-10-CM | POA: Diagnosis not present

## 2018-07-04 DIAGNOSIS — E1022 Type 1 diabetes mellitus with diabetic chronic kidney disease: Secondary | ICD-10-CM | POA: Diagnosis not present

## 2018-07-04 DIAGNOSIS — R197 Diarrhea, unspecified: Secondary | ICD-10-CM | POA: Diagnosis not present

## 2018-07-05 DIAGNOSIS — E1059 Type 1 diabetes mellitus with other circulatory complications: Secondary | ICD-10-CM | POA: Diagnosis not present

## 2018-07-05 DIAGNOSIS — E108 Type 1 diabetes mellitus with unspecified complications: Secondary | ICD-10-CM | POA: Diagnosis not present

## 2018-07-05 DIAGNOSIS — E1040 Type 1 diabetes mellitus with diabetic neuropathy, unspecified: Secondary | ICD-10-CM | POA: Diagnosis not present

## 2018-07-05 DIAGNOSIS — Z794 Long term (current) use of insulin: Secondary | ICD-10-CM | POA: Diagnosis not present

## 2018-07-06 DIAGNOSIS — E1022 Type 1 diabetes mellitus with diabetic chronic kidney disease: Secondary | ICD-10-CM | POA: Diagnosis not present

## 2018-07-06 DIAGNOSIS — N186 End stage renal disease: Secondary | ICD-10-CM | POA: Diagnosis not present

## 2018-07-06 DIAGNOSIS — E8770 Fluid overload, unspecified: Secondary | ICD-10-CM | POA: Diagnosis not present

## 2018-07-06 DIAGNOSIS — R197 Diarrhea, unspecified: Secondary | ICD-10-CM | POA: Diagnosis not present

## 2018-07-06 DIAGNOSIS — D631 Anemia in chronic kidney disease: Secondary | ICD-10-CM | POA: Diagnosis not present

## 2018-07-06 DIAGNOSIS — T82818A Embolism of vascular prosthetic devices, implants and grafts, initial encounter: Secondary | ICD-10-CM | POA: Diagnosis not present

## 2018-07-06 DIAGNOSIS — N2581 Secondary hyperparathyroidism of renal origin: Secondary | ICD-10-CM | POA: Diagnosis not present

## 2018-07-09 DIAGNOSIS — D631 Anemia in chronic kidney disease: Secondary | ICD-10-CM | POA: Diagnosis not present

## 2018-07-09 DIAGNOSIS — T82818A Embolism of vascular prosthetic devices, implants and grafts, initial encounter: Secondary | ICD-10-CM | POA: Diagnosis not present

## 2018-07-09 DIAGNOSIS — N2581 Secondary hyperparathyroidism of renal origin: Secondary | ICD-10-CM | POA: Diagnosis not present

## 2018-07-09 DIAGNOSIS — R197 Diarrhea, unspecified: Secondary | ICD-10-CM | POA: Diagnosis not present

## 2018-07-09 DIAGNOSIS — E8770 Fluid overload, unspecified: Secondary | ICD-10-CM | POA: Diagnosis not present

## 2018-07-09 DIAGNOSIS — N186 End stage renal disease: Secondary | ICD-10-CM | POA: Diagnosis not present

## 2018-07-09 DIAGNOSIS — E1022 Type 1 diabetes mellitus with diabetic chronic kidney disease: Secondary | ICD-10-CM | POA: Diagnosis not present

## 2018-07-11 DIAGNOSIS — E8770 Fluid overload, unspecified: Secondary | ICD-10-CM | POA: Diagnosis not present

## 2018-07-11 DIAGNOSIS — E1022 Type 1 diabetes mellitus with diabetic chronic kidney disease: Secondary | ICD-10-CM | POA: Diagnosis not present

## 2018-07-11 DIAGNOSIS — R197 Diarrhea, unspecified: Secondary | ICD-10-CM | POA: Diagnosis not present

## 2018-07-11 DIAGNOSIS — D631 Anemia in chronic kidney disease: Secondary | ICD-10-CM | POA: Diagnosis not present

## 2018-07-11 DIAGNOSIS — N186 End stage renal disease: Secondary | ICD-10-CM | POA: Diagnosis not present

## 2018-07-11 DIAGNOSIS — N2581 Secondary hyperparathyroidism of renal origin: Secondary | ICD-10-CM | POA: Diagnosis not present

## 2018-07-11 DIAGNOSIS — T82818A Embolism of vascular prosthetic devices, implants and grafts, initial encounter: Secondary | ICD-10-CM | POA: Diagnosis not present

## 2018-07-13 DIAGNOSIS — N2581 Secondary hyperparathyroidism of renal origin: Secondary | ICD-10-CM | POA: Diagnosis not present

## 2018-07-13 DIAGNOSIS — T82818A Embolism of vascular prosthetic devices, implants and grafts, initial encounter: Secondary | ICD-10-CM | POA: Diagnosis not present

## 2018-07-13 DIAGNOSIS — E1022 Type 1 diabetes mellitus with diabetic chronic kidney disease: Secondary | ICD-10-CM | POA: Diagnosis not present

## 2018-07-13 DIAGNOSIS — R197 Diarrhea, unspecified: Secondary | ICD-10-CM | POA: Diagnosis not present

## 2018-07-13 DIAGNOSIS — D631 Anemia in chronic kidney disease: Secondary | ICD-10-CM | POA: Diagnosis not present

## 2018-07-13 DIAGNOSIS — N186 End stage renal disease: Secondary | ICD-10-CM | POA: Diagnosis not present

## 2018-07-13 DIAGNOSIS — E8770 Fluid overload, unspecified: Secondary | ICD-10-CM | POA: Diagnosis not present

## 2018-07-16 DIAGNOSIS — D631 Anemia in chronic kidney disease: Secondary | ICD-10-CM | POA: Diagnosis not present

## 2018-07-16 DIAGNOSIS — N2581 Secondary hyperparathyroidism of renal origin: Secondary | ICD-10-CM | POA: Diagnosis not present

## 2018-07-16 DIAGNOSIS — E1022 Type 1 diabetes mellitus with diabetic chronic kidney disease: Secondary | ICD-10-CM | POA: Diagnosis not present

## 2018-07-16 DIAGNOSIS — T82818A Embolism of vascular prosthetic devices, implants and grafts, initial encounter: Secondary | ICD-10-CM | POA: Diagnosis not present

## 2018-07-16 DIAGNOSIS — N186 End stage renal disease: Secondary | ICD-10-CM | POA: Diagnosis not present

## 2018-07-16 DIAGNOSIS — E8770 Fluid overload, unspecified: Secondary | ICD-10-CM | POA: Diagnosis not present

## 2018-07-16 DIAGNOSIS — R197 Diarrhea, unspecified: Secondary | ICD-10-CM | POA: Diagnosis not present

## 2018-07-18 ENCOUNTER — Other Ambulatory Visit: Payer: Self-pay

## 2018-07-18 ENCOUNTER — Telehealth (HOSPITAL_COMMUNITY): Payer: Self-pay | Admitting: Rehabilitation

## 2018-07-18 DIAGNOSIS — E1022 Type 1 diabetes mellitus with diabetic chronic kidney disease: Secondary | ICD-10-CM | POA: Diagnosis not present

## 2018-07-18 DIAGNOSIS — N186 End stage renal disease: Secondary | ICD-10-CM | POA: Diagnosis not present

## 2018-07-18 DIAGNOSIS — R197 Diarrhea, unspecified: Secondary | ICD-10-CM | POA: Diagnosis not present

## 2018-07-18 DIAGNOSIS — D631 Anemia in chronic kidney disease: Secondary | ICD-10-CM | POA: Diagnosis not present

## 2018-07-18 DIAGNOSIS — E8770 Fluid overload, unspecified: Secondary | ICD-10-CM | POA: Diagnosis not present

## 2018-07-18 DIAGNOSIS — M7989 Other specified soft tissue disorders: Secondary | ICD-10-CM

## 2018-07-18 DIAGNOSIS — T82818A Embolism of vascular prosthetic devices, implants and grafts, initial encounter: Secondary | ICD-10-CM | POA: Diagnosis not present

## 2018-07-18 DIAGNOSIS — N2581 Secondary hyperparathyroidism of renal origin: Secondary | ICD-10-CM | POA: Diagnosis not present

## 2018-07-18 NOTE — Telephone Encounter (Signed)
The above patient or their representative was contacted and gave the following answers to these questions:         Do you have any of the following symptoms? No  Fever                    Cough                   Shortness of breath  Do  you have any of the following other symptoms? No   muscle pain         vomiting,        diarrhea        rash         weakness        red eye        abdominal pain         bruising          bruising or bleeding              joint pain           severe headache    Have you been in contact with someone who was or has been sick in the past 2 weeks? No  Yes                 Unsure                         Unable to assess   Does the person that you were in contact with have any of the following symptoms? N/A   Cough         shortness of breath           muscle pain         vomiting,            diarrhea            rash            weakness           fever            red eye           abdominal pain           bruising  or  bleeding                joint pain                severe headache               Have you  or someone you have been in contact with traveled internationally in th last month?  No       If yes, which countries?   Have you  or someone you have been in contact with traveled outside [place] in th last month? No        If yes, which state and city?   COMMENTS OR ACTION PLAN FOR THIS PATIENT:

## 2018-07-19 ENCOUNTER — Other Ambulatory Visit: Payer: Self-pay

## 2018-07-19 ENCOUNTER — Ambulatory Visit (HOSPITAL_COMMUNITY)
Admission: RE | Admit: 2018-07-19 | Discharge: 2018-07-19 | Disposition: A | Discharge status: Home / Self Care | Payer: Medicare Other | Source: Ambulatory Visit | Attending: Family | Admitting: Family

## 2018-07-19 ENCOUNTER — Ambulatory Visit (INDEPENDENT_AMBULATORY_CARE_PROVIDER_SITE_OTHER): Payer: Medicare Other | Admitting: Vascular Surgery

## 2018-07-19 ENCOUNTER — Encounter: Payer: Self-pay | Admitting: Vascular Surgery

## 2018-07-19 VITALS — BP 102/58 | HR 80 | Temp 97.7°F | Resp 20 | Ht 68.0 in | Wt 278.2 lb

## 2018-07-19 DIAGNOSIS — M7989 Other specified soft tissue disorders: Secondary | ICD-10-CM | POA: Diagnosis not present

## 2018-07-19 MED ORDER — CEPHALEXIN 500 MG PO CAPS: 500.0000 mg | ORAL_CAPSULE | Freq: Two times a day (BID) | ORAL | 0 refills | Status: DC

## 2018-07-19 NOTE — Progress Notes (Signed)
Referring Physician: Dr Augustin Coupe  Patient name: Dale [name] MRN: [id number] DOB: 03/31/1966 Sex: male  REASON FOR CONSULT: Right leg swelling  HPI: Dale [name] is a 53 y.o. male with a 1 month history of swelling redness and tenderness in his right medial thigh.  This resolved over a few weeks and now he has only swelling on the right side.  The tenderness and redness has completely resolved.  He has worn Unna boots in the past to heal up ulcers.  He does have a history of congestive heart failure.  He did have a right thigh graft removed for infection in the past.  Does not really have any complaints in the left leg.  Other medical problems include end-stage renal disease, coronary artery disease, diabetes, anemia all of which have been stable.  He did have a biopsy of a left inguinal lymph node in 2013 which showed plasmacytosis.  He does not recall whether any further evaluation of this was performed.  Past Medical History:  Diagnosis Date  . Anal fissure   . Anemia of chronic disease   . Arthritis    "hands, right knee" (03/19/2014) no cartlidge in right knee  . CAD (coronary artery disease)    a. Abnl nuc ->LHC (11/15):  mid to dist Dx 80%, mid RCA 99% (functional CTO), dist RCA 80% >> PCI: balloon angioplasty to mid RCA (could not deliver stent) - staged PCI Rotoblator atherectomy/DES to [facility]. b. NSTEMI 12/2015 s/p DES to diagonal.  . Cholecystitis    a. 08/27/2011  . Chronic diastolic CHF (congestive heart failure) ([place])    a. Echo 3/13:  EF 55-60%;  b. Echo (11/15):  Mild LVH, EF 60-65%, no RWMA, Gr 1 DD, MAC, mild LAE, normal RVF, mild RAE;  c.  Echo 5/16:  severe LVH, EF 55-60%, no RWMA, Gr 1 DD, MAC, mild LAE  . Claustrophobia    when things get around his face.   . Complication of anesthesia   . Difficult intubation    only once in 2015 at [facility] haven't had a problem since then  . Diverticulosis   . Esophageal dysmotility   . ESRD (end stage renal disease) ([place])    a. 1995 s/p cadaveric transplant w/ susbequent failure after 18 yrs;  b.Dialysis initiated 07/2011 [facility] M[facility]  . Fibromyalgia   . Gastroparesis   . GERD (gastroesophageal reflux disease)   . Gout    PMH  . History of blood transfusion   . HLD (hyperlipidemia)   . Hypertension   . Hypothyroidism   . Inguinal lymphadenopathy    a. bilateral - s/p biopsy 07/2011  . Insulin dependent diabetes mellitus (HCC)    Type I  . Monilial esophagitis ([place])   . Morbid obesity ([place])   . OSA (obstructive sleep apnea)    adjustable bed  . PAD (peripheral artery disease) (HCC)    a. s/p L BKA.  [name] Kitchen Pericardial effusion    a.  Small by CT 08/22/11;  b.  Large by CT 08/27/11. c. Not seen on 11/2015 echo.  . Pneumonia ~ 2007  . PONV (postoperative nausea and vomiting)    vomited once after a procedure.  . T1DM (type 1 diabetes mellitus) ([place])   . Tracheobronchomalacia    Past Surgical History:  Procedure Laterality Date  . A/V FISTULAGRAM Left 07/26/2016   Procedure: A/V Fistulagram;  Surgeon: Waynetta Sandy, MD;  Location: [place] CV LAB;  Service:  Cardiovascular;  Laterality: Left;  . A/V FISTULAGRAM N/A 07/11/2017   Procedure: A/V FISTULAGRAM - Left AV;  Surgeon: Waynetta Sandy, MD;  Location: [place] CV LAB;  Service: Cardiovascular;  Laterality: N/A;  . AMPUTATION OF REPLICATED TOES Right   . ANGIOPLASTY  04/09/2014   RCA   . APPENDECTOMY  ~ 2006  . ARTERIOVENOUS GRAFT PLACEMENT Left 1993?   forearm  . ARTERIOVENOUS GRAFT PLACEMENT Right 1993?   leg  . ARTERIOVENOUS GRAFT PLACEMENT Left    Thigh  . Arteriovenous Graft Removed Left    Thigh  . AV FISTULA PLACEMENT  07/29/2011   Procedure: ARTERIOVENOUS (AV) FISTULA CREATION;  Surgeon: Mal Misty, MD;  Location: [place];  Service: Vascular;  Laterality: Right;  Brachial cephalic  . AV FISTULA PLACEMENT Left 01/20/2015   Procedure: LIGATION OF RIGHT ARTERIOVENOUS FISTULA WITH EXCISION OF ANEURYSM;  Surgeon:  Angelia Mould, MD;  Location: [place];  Service: Vascular;  Laterality: Left;  . AV FISTULA PLACEMENT Left 04/30/2015   Procedure: CREATION OF LEFT ARM BRACHIO-CEPHALIC ARTERIOVENOUS (AV) FISTULA ;  Surgeon: Angelia Mould, MD;  Location: [place];  Service: Vascular;  Laterality: Left;  . AV Graft PLACEMENT Left 1993?   "attempted one in my wrist; didn't take"  . BELOW KNEE LEG AMPUTATION Left 2010  . CARDIAC CATHETERIZATION  03/19/2014  . CARDIAC CATHETERIZATION  03/19/2014   Procedure: CORONARY BALLOON ANGIOPLASTY;  Surgeon: Burnell Blanks, MD;  Location: [facility] CATH LAB;  Service: Cardiovascular;;  . CARDIAC CATHETERIZATION N/A 01/01/2016   Procedure: Left Heart Cath and Coronary Angiography;  Surgeon: Troy Sine, MD;  Location: [place] CV LAB;  Service: Cardiovascular;  Laterality: N/A;  . CARDIAC CATHETERIZATION N/A 01/01/2016   Procedure: Coronary Stent Intervention;  Surgeon: Troy Sine, MD;  Location: [place] CV LAB;  Service: Cardiovascular;  Laterality: N/A;  . CARPAL TUNNEL RELEASE Bilateral after 2005  . CATARACT EXTRACTION W/ INTRAOCULAR LENS  IMPLANT, BILATERAL Bilateral 1990's  . COLONOSCOPY  08/29/2011   Procedure: COLONOSCOPY;  Surgeon: Jerene Bears, MD;  Location: [place];  Service: Gastroenterology;  Laterality: N/A;  . COLONOSCOPY WITH PROPOFOL N/A 04/26/2016   Procedure: COLONOSCOPY WITH PROPOFOL;  Surgeon: Jerene Bears, MD;  Location: [facility] ENDOSCOPY;  Service: Gastroenterology;  Laterality: N/A;  . CORONARY ANGIOPLASTY WITH STENT PLACEMENT  04/09/2014       ptca/des mid lad   . ESOPHAGOGASTRODUODENOSCOPY N/A 12/25/2015   Procedure: ESOPHAGOGASTRODUODENOSCOPY (EGD);  Surgeon: Mauri Pole, MD;  Location: [facility] ENDOSCOPY;  Service: Endoscopy;  Laterality: N/A;  bedside  . ESOPHAGOGASTRODUODENOSCOPY (EGD) WITH PROPOFOL N/A 04/26/2016   Procedure: ESOPHAGOGASTRODUODENOSCOPY (EGD) WITH PROPOFOL;  Surgeon: Jerene Bears, MD;  Location: [facility] ENDOSCOPY;   Service: Gastroenterology;  Laterality: N/A;  . EYE SURGERY Bilateral    cataract  . FEMORAL ARTERY REPAIR  04/09/2014   ANGIOSEAL  . INSERTION OF DIALYSIS CATHETER  08/01/2011   Procedure: INSERTION OF DIALYSIS CATHETER;  Surgeon: Rosetta Posner, MD;  Location: [place];  Service: Vascular;  Laterality: Right;  insertion of dialysis catheter on right internal jugular vein  . INSERTION OF DIALYSIS CATHETER Right 01/20/2015   Procedure: INSERTION OF DIALYSIS CATHETER - RIGHT INTERNAL JUGULAR ;  Surgeon: Angelia Mould, MD;  Location: [place];  Service: Vascular;  Laterality: Right;  . KIDNEY TRANSPLANT  04/25/1993  . LEFT HEART CATHETERIZATION WITH CORONARY ANGIOGRAM N/A 03/19/2014   Procedure: LEFT HEART CATHETERIZATION WITH CORONARY ANGIOGRAM;  Surgeon: Annita Brod  [name], MD;  Location: [place] CATH LAB;  Service: Cardiovascular;  Laterality: N/A;  . LYMPH NODE BIOPSY  08/10/2011   Procedure: LYMPH NODE BIOPSY;  Surgeon: Merrie Roof, MD;  Location: [place];  Service: General;  Laterality: Left;  left groin lymph biopsy  . NEUROPLASTY / TRANSPOSITION ULNAR NERVE AT ELBOW Left after 2005  . PATCH ANGIOPLASTY Left 08/09/2016   Procedure: LEFT  ARTERIOVENOUS FISTULA PATCH ANGIOPLASTY;  Surgeon: Waynetta Sandy, MD;  Location: [place];  Service: Vascular;  Laterality: Left;  . PERCUTANEOUS CORONARY ROTOBLATOR INTERVENTION (PCI-R) N/A 04/09/2014   Procedure: PERCUTANEOUS CORONARY ROTOBLATOR INTERVENTION (PCI-R);  Surgeon: Burnell Blanks, MD;  Location: [facility] CATH LAB;  Service: Cardiovascular;  Laterality: N/A;  . PERIPHERAL VASCULAR BALLOON ANGIOPLASTY  07/11/2017   Procedure: PERIPHERAL VASCULAR BALLOON ANGIOPLASTY;  Surgeon: Waynetta Sandy, MD;  Location: [place] CV LAB;  Service: Cardiovascular;;  left arm AV fistula  . PERIPHERAL VASCULAR CATHETERIZATION N/A 12/25/2014   Procedure: Upper Extremity Venography;  Surgeon: Conrad [place], MD;  Location: [place] CV LAB;   Service: Cardiovascular;  Laterality: N/A;  . PERIPHERAL VASCULAR CATHETERIZATION Left 02/24/2016   Procedure: Fistulagram;  Surgeon: Waynetta Sandy, MD;  Location: [place] CV LAB;  Service: Cardiovascular;  Laterality: Left;  . PERIPHERAL VASCULAR CATHETERIZATION Left 02/24/2016   Procedure: Peripheral Vascular Intervention;  Surgeon: Waynetta Sandy, MD;  Location: [place] CV LAB;  Service: Cardiovascular;  Laterality: Left;  AV FISTULA  . PERITONEAL CATHETER INSERTION    . PERITONEAL CATHETER REMOVAL    . REVISON OF ARTERIOVENOUS FISTULA Right 7/00/1749   Procedure: PLICATION / REVISION OF ARTERIOVENOUS FISTULA;  Surgeon: Angelia Mould, MD;  Location: [place];  Service: Vascular;  Laterality: Right;  . REVISON OF ARTERIOVENOUS FISTULA Left 09/01/2015   Procedure: SUPERFICIALIZATION OF LEFT ARM BRACHIOCEPHALIC ARTERIOVENOUS FISTULA;  Surgeon: Angelia Mould, MD;  Location: [place];  Service: Vascular;  Laterality: Left;  . REVISON OF ARTERIOVENOUS FISTULA Left 08/09/2016   Procedure: REVISION OF ARTERIAL ANASTOMOSIS OF ARTERIOVENOUS FISTULA;  Surgeon: Waynetta Sandy, MD;  Location: [place];  Service: Vascular;  Laterality: Left;  . SHOULDER ARTHROSCOPY Left ~ 2006   frozen  . TOE AMPUTATION Bilateral after 2005  . UMBILICAL HERNIA REPAIR  1990's  . VENOGRAM Right 07/28/2011   Procedure: VENOGRAM;  Surgeon: Conrad [place], MD;  Location: [facility] CATH LAB;  Service: Cardiovascular;  Laterality: Right;  . VITRECTOMY Bilateral     Family History  Problem Relation Age of Onset  . Colon polyps Father   . Diabetes Father   . Hypertension Father   . Hypertension Mother   . Diabetes Mother   . Hyperlipidemia Mother   . Breast cancer Maternal Aunt   . Bone cancer Maternal Aunt   . Liver cancer Maternal Aunt   . Malignant hyperthermia Neg Hx   . Heart attack Neg Hx   . Stroke Neg Hx   . Colon cancer Neg Hx   . Stomach cancer Neg Hx   . Pancreatic cancer  Neg Hx   . Esophageal cancer Neg Hx     SOCIAL HISTORY: Social History   Socioeconomic History  . Marital status: Married    Spouse name: Not on file  . Number of children: Not on file  . Years of education: Not on file  . Highest education level: Not on file  Occupational History  . Occupation: Disabled  Social Needs  . Financial resource strain:  Not on file  . Food insecurity:    Worry: Not on file    Inability: Not on file  . Transportation needs:    Medical: Not on file    Non-medical: Not on file  Tobacco Use  . Smoking status: Never Smoker  . Smokeless tobacco: Never Used  Substance and Sexual Activity  . Alcohol use: Yes    Comment: occasional  . Drug use: No  . Sexual activity: Not on file  Lifestyle  . Physical activity:    Days per week: Not on file    Minutes per session: Not on file  . Stress: Not on file  Relationships  . Social connections:    Talks on phone: Not on file    Gets together: Not on file    Attends religious service: Not on file    Active member of club or organization: Not on file    Attends meetings of clubs or organizations: Not on file    Relationship status: Not on file  . Intimate partner violence:    Fear of current or ex partner: Not on file    Emotionally abused: Not on file    Physically abused: Not on file    Forced sexual activity: Not on file  Other Topics Concern  . Not on file  Social History Narrative   Lives @ home with his wife in [place].     Allergies  Allergen Reactions  . Iodinated Diagnostic Agents Itching, Rash and Other (See Comments)    Systemic itching and transient rash appearance within hours of receiving contrast    . Prednisone Other (See Comments)    Increases sugar levels  . Adhesive [Tape] Itching and Other (See Comments)    Blisters on arm from adhesive tape at dialysis   . Amoxicillin Itching, Rash and Other (See Comments)    Has patient had a PCN reaction causing immediate rash,  facial/tongue/throat swelling, SOB or lightheadedness with hypotension: Yes Has patient had a PCN reaction causing severe rash involving mucus membranes or skin necrosis: No Has patient had a PCN reaction that required hospitalization No Has patient had a PCN reaction occurring within the last 10 years: No If all of the above answers are "NO", then may proceed with Cephalosporin use.  . Clindamycin/Lincomycin Hives and Itching  . Enalapril Rash and Cough    Cough  . Mobic [Meloxicam] Hives and Rash  . Morphine Hives, Nausea Only and Rash  . Vancomycin Hives  . Brilinta [Ticagrelor] Nausea And Vomiting  . Ciprofloxacin Itching and Rash  . Clopidogrel Rash  . Codeine Hives, Itching and Rash  . Doxycycline Rash  . Hydrocodone Rash  . Levofloxacin Hives and Itching  . Oxycodone Rash  . Rocephin [Ceftriaxone] Itching    Current Outpatient Medications  Medication Sig Dispense Refill  . allopurinol (ZYLOPRIM) 100 MG tablet Take 2 tablets (200 mg total) by mouth daily. 180 tablet 3  . AMBULATORY NON FORMULARY MEDICATION Medication Name: Gi cocktail 5-10 mL every 4-6 hours as needed for pain 450 mL 0  . aspirin EC 81 MG tablet Take 81 mg by mouth at bedtime.    [name] Kitchen atorvastatin (LIPITOR) 10 MG tablet Take 1 tablet daily    . calcium acetate (PHOSLO) 667 MG capsule Take 667-1,334 mg by mouth See admin instructions. Take 1,334 mg by mouth three times a day with meals and 667 mg with each snack    . cetirizine (ZYRTEC) 10 MG tablet Take 10 mg by  mouth daily.    . diphenhydrAMINE (BENADRYL) 50 MG tablet Take 50 mg by mouth every 6 (six) hours as needed (for itching or allergic reactions).    . docusate sodium (COLACE) 100 MG capsule Take 200 mg by mouth 2 (two) times daily.     . fluticasone (FLONASE) 50 MCG/ACT nasal spray Place 2 sprays into both nostrils daily as needed for allergies.     [name] Kitchen gabapentin (NEURONTIN) 300 MG capsule Take 1 capsule (300 mg total) by mouth daily. 30 capsule 0  .  GLUCAGON EMERGENCY 1 MG injection Inject 1 mg into the muscle daily as needed (low blood sugar).     . hydrocortisone 1 % ointment Apply 1 application topically 2 (two) times daily. 110 g 0  . HYDROmorphone (DILAUDID) 2 MG tablet Take 2 mg by mouth 2 (two) times daily.     . hydrOXYzine (ATARAX/VISTARIL) 10 MG tablet Take 1 tablet (10 mg total) by mouth every 8 (eight) hours as needed for itching or anxiety. 30 tablet 1  . insulin lispro (HUMALOG) 100 UNIT/ML injection Per insulin pump    . levothyroxine (SYNTHROID, LEVOTHROID) 112 MCG tablet Take 112 mcg by mouth every evening. 2 hours after dinner    . midodrine (PROAMATINE) 10 MG tablet Take 1 tablet (10 mg total) by mouth 3 (three) times daily. Takes 1 tab in mornings of dialysis and takes 2 tabs 30 mins before starting dialysis (m,w,f) (Patient taking differently: Take 10 mg by mouth on the mornings of dialysis (Mon/Wed/Fri) and 20 mg thirty minutes prior to starting dialysis (Mon/Wed/Fri))    . multivitamin (RENA-VIT) TABS tablet Take 1 tablet by mouth daily.    . nitroGLYCERIN (NITROSTAT) 0.4 MG SL tablet Place 1 tablet (0.4 mg total) under the tongue every 5 (five) minutes as needed for chest pain. 25 tablet 4  . Nutritional Supplements (FEEDING SUPPLEMENT, NEPRO CARB STEADY,) LIQD Take 237 mLs by mouth daily. (Patient taking differently: Take 237 mLs by mouth every Monday, Wednesday, and Friday with hemodialysis. ) 30 Can 0  . Polyethyl Glycol-Propyl Glycol (SYSTANE) 0.4-0.3 % SOLN Place 1 drop into both eyes 3 (three) times daily as needed (for dry eyes).     . polyethylene glycol (MIRALAX / GLYCOLAX) packet Take 17 g by mouth daily. (Patient taking differently: Take 17 g by mouth daily as needed for mild constipation. ) 14 each 0  . prasugrel (EFFIENT) 10 MG TABS tablet TAKE 1 TABLET BY MOUTH EVERY DAY 90 tablet 1  . testosterone cypionate (DEPOTESTOSTERONE CYPIONATE) 200 MG/ML injection INJECT 200 MG INTO THE MUSCLE EVERY 14 DAYS  1  .  VELPHORO 500 MG chewable tablet Chew 500-1,000 mg by mouth 3 (three) times daily with meals. Chew 1,000 mg by mouth three times a day with meals and 500 mg with each snack  10  . rOPINIRole (REQUIP) 2 MG tablet Take 1 tablet (2 mg total) by mouth at bedtime. Take 2 mg by mouth every night at bedtime 30 tablet 0   No current facility-administered medications for this visit.     ROS:   General:  No weight loss, Fever, chills  HEENT: No recent headaches, no nasal bleeding, no visual changes, no sore throat  Neurologic: No dizziness, blackouts, seizures. No recent symptoms of stroke or mini- stroke. No recent episodes of slurred speech, or temporary blindness.  Cardiac: No recent episodes of chest pain/pressure, no shortness of breath at rest.  No shortness of breath with exertion.  Denies history of  atrial fibrillation or irregular heartbeat  Vascular: No history of rest pain in feet.  No history of claudication.  No history of non-healing ulcer, No history of DVT   Pulmonary: No home oxygen, no productive cough, no hemoptysis,  No asthma or wheezing  Musculoskeletal:  _0  Arthritis, _1  Low back pain,  _2  Joint pain  Hematologic:No history of hypercoagulable state.  No history of easy bleeding.  No history of anemia  Gastrointestinal: No hematochezia or melena,  No gastroesophageal reflux, no trouble swallowing  Urinary: _3  chronic Kidney disease, _4  on HD - _5  MWF or _6  TTHS, _7  Burning with urination, _8  Frequent urination, _9  Difficulty urinating;   Skin: No rashes  Psychological: No history of anxiety,  No history of depression   Physical Examination  Vitals:   07/19/18 0927  BP: (!) 102/58  Pulse: 80  Resp: 20  Temp: 97.7 F (36.5 C)  SpO2: 93%  Weight: 278 lb 3.5 oz (126.2 kg)  Height: _10  (1.727 m)    Body mass index is 42.3 kg/m.  General:  Alert and oriented, no acute distress HEENT: Normal Neck: No bruit or JVD Pulmonary: Clear to auscultation  bilaterally Cardiac: Regular Rate and Rhythm  Abdomen: Soft, non-tender, non-distended, no mass, obese Skin: No rash hemosiderin staining right calf Extremity Pulses:  2+ radial, brachial, femoral, absent dorsalis pedis, posterior tibial pulses bilaterally Musculoskeletal: Pretibial edema bilaterally, induration right medial and left medial thigh without fluctuance or erythema Neurologic: Upper and lower extremity motor 5/5 and symmetric  DATA:  Patient had a duplex ultrasound today which showed no evidence of DVT.  We were unable to visualize the caval segments due to the patient's obesity.  However, waveforms at the level of the external iliac vein did not necessarily suggest inferior vena cava obstruction.  There was a mass in his right groin consistent with enlarged lymph nodes.  ASSESSMENT: Bilateral leg swelling and induration.  I suspect this is more chronic in nature than the patient's history suggests.  The induration in both legs is probably secondary to chronic swelling.  He is not really debilitated by lower extremity swelling symptoms.  Although we were not able to fully assess his inferior vena cava the portions that we were able to see did not suggest proximal obstruction of the cava.  The patient did have evidence of enlarged lymph nodes in the right groin.  He has previously had a lymph node biopsy in the left groin was suggested plasmacytosis.  The lymph node enlargement could be secondary to prior infection.  However this also could represent some sort of malignancy.   PLAN: I placed the patient on Keflex 500 mg twice a day for 2 weeks today.  He will follow-up with his primary care physician after 2 weeks and see if his symptoms are improved.  If not consideration may need to be given for biopsy of the lymph node mass in his right groin.  I will leave this at the discretion of his primary care physician.  As far as his vascular assessment I do not believe he needs further  evaluation or follow-up for this.  I believe the likelihood of inferior vena cava occlusion is low.   Ruta Hinds, MD [facility] Office: [phone] Pager: [phone]

## 2018-07-20 DIAGNOSIS — R197 Diarrhea, unspecified: Secondary | ICD-10-CM | POA: Diagnosis not present

## 2018-07-20 DIAGNOSIS — N2581 Secondary hyperparathyroidism of renal origin: Secondary | ICD-10-CM | POA: Diagnosis not present

## 2018-07-20 DIAGNOSIS — E1022 Type 1 diabetes mellitus with diabetic chronic kidney disease: Secondary | ICD-10-CM | POA: Diagnosis not present

## 2018-07-20 DIAGNOSIS — T82818A Embolism of vascular prosthetic devices, implants and grafts, initial encounter: Secondary | ICD-10-CM | POA: Diagnosis not present

## 2018-07-20 DIAGNOSIS — D631 Anemia in chronic kidney disease: Secondary | ICD-10-CM | POA: Diagnosis not present

## 2018-07-20 DIAGNOSIS — E8770 Fluid overload, unspecified: Secondary | ICD-10-CM | POA: Diagnosis not present

## 2018-07-20 DIAGNOSIS — N186 End stage renal disease: Secondary | ICD-10-CM | POA: Diagnosis not present

## 2018-07-23 DIAGNOSIS — R197 Diarrhea, unspecified: Secondary | ICD-10-CM | POA: Diagnosis not present

## 2018-07-23 DIAGNOSIS — D631 Anemia in chronic kidney disease: Secondary | ICD-10-CM | POA: Diagnosis not present

## 2018-07-23 DIAGNOSIS — N186 End stage renal disease: Secondary | ICD-10-CM | POA: Diagnosis not present

## 2018-07-23 DIAGNOSIS — T82818A Embolism of vascular prosthetic devices, implants and grafts, initial encounter: Secondary | ICD-10-CM | POA: Diagnosis not present

## 2018-07-23 DIAGNOSIS — E8770 Fluid overload, unspecified: Secondary | ICD-10-CM | POA: Diagnosis not present

## 2018-07-23 DIAGNOSIS — N2581 Secondary hyperparathyroidism of renal origin: Secondary | ICD-10-CM | POA: Diagnosis not present

## 2018-07-23 DIAGNOSIS — E1022 Type 1 diabetes mellitus with diabetic chronic kidney disease: Secondary | ICD-10-CM | POA: Diagnosis not present

## 2018-07-24 DIAGNOSIS — T82818A Embolism of vascular prosthetic devices, implants and grafts, initial encounter: Secondary | ICD-10-CM | POA: Diagnosis not present

## 2018-07-24 DIAGNOSIS — T8612 Kidney transplant failure: Secondary | ICD-10-CM | POA: Diagnosis not present

## 2018-07-24 DIAGNOSIS — N2581 Secondary hyperparathyroidism of renal origin: Secondary | ICD-10-CM | POA: Diagnosis not present

## 2018-07-24 DIAGNOSIS — N186 End stage renal disease: Secondary | ICD-10-CM | POA: Diagnosis not present

## 2018-07-24 DIAGNOSIS — E1022 Type 1 diabetes mellitus with diabetic chronic kidney disease: Secondary | ICD-10-CM | POA: Diagnosis not present

## 2018-07-24 DIAGNOSIS — Z992 Dependence on renal dialysis: Secondary | ICD-10-CM | POA: Diagnosis not present

## 2018-07-24 DIAGNOSIS — D631 Anemia in chronic kidney disease: Secondary | ICD-10-CM | POA: Diagnosis not present

## 2018-07-24 DIAGNOSIS — E8770 Fluid overload, unspecified: Secondary | ICD-10-CM | POA: Diagnosis not present

## 2018-07-24 DIAGNOSIS — R197 Diarrhea, unspecified: Secondary | ICD-10-CM | POA: Diagnosis not present

## 2018-07-25 DIAGNOSIS — D631 Anemia in chronic kidney disease: Secondary | ICD-10-CM | POA: Diagnosis not present

## 2018-07-25 DIAGNOSIS — N186 End stage renal disease: Secondary | ICD-10-CM | POA: Diagnosis not present

## 2018-07-25 DIAGNOSIS — D689 Coagulation defect, unspecified: Secondary | ICD-10-CM | POA: Diagnosis not present

## 2018-07-25 DIAGNOSIS — N2581 Secondary hyperparathyroidism of renal origin: Secondary | ICD-10-CM | POA: Diagnosis not present

## 2018-07-25 DIAGNOSIS — T82818A Embolism of vascular prosthetic devices, implants and grafts, initial encounter: Secondary | ICD-10-CM | POA: Diagnosis not present

## 2018-07-25 DIAGNOSIS — E1022 Type 1 diabetes mellitus with diabetic chronic kidney disease: Secondary | ICD-10-CM | POA: Diagnosis not present

## 2018-07-27 DIAGNOSIS — N2581 Secondary hyperparathyroidism of renal origin: Secondary | ICD-10-CM | POA: Diagnosis not present

## 2018-07-27 DIAGNOSIS — E1022 Type 1 diabetes mellitus with diabetic chronic kidney disease: Secondary | ICD-10-CM | POA: Diagnosis not present

## 2018-07-27 DIAGNOSIS — D631 Anemia in chronic kidney disease: Secondary | ICD-10-CM | POA: Diagnosis not present

## 2018-07-27 DIAGNOSIS — T82818A Embolism of vascular prosthetic devices, implants and grafts, initial encounter: Secondary | ICD-10-CM | POA: Diagnosis not present

## 2018-07-27 DIAGNOSIS — D689 Coagulation defect, unspecified: Secondary | ICD-10-CM | POA: Diagnosis not present

## 2018-07-27 DIAGNOSIS — N186 End stage renal disease: Secondary | ICD-10-CM | POA: Diagnosis not present

## 2018-07-28 ENCOUNTER — Telehealth: Payer: Self-pay | Admitting: Family Medicine

## 2018-07-28 NOTE — Telephone Encounter (Signed)
**  After Hours/ Emergency Line Call**  Received a call to report that Dale [name] reporting new O2 requriement.  Started having diarrhea starting yesterday but worsened today. Patient also has new O2 requirement of 3.5L, without O2 O2 sats were in 70s. Patient feels more fatigued. Current O2 sat of 70% with 3.5L O2 on. Patient also reporting temp of 99.6 but denies cough. Has noticed increased sputum production. Has runny nose as well. Recommended that patient be evaluated in ED as he continues to desat on 3.5L O2. Patient stated he did not want EMS and would come via his wife.  Red flags discussed.  Will forward to PCP.   Caroline More, DO PGY-2, [facility] 07/28/2018 10:35 PM

## 2018-07-29 ENCOUNTER — Emergency Department (HOSPITAL_COMMUNITY): Payer: Medicare Other

## 2018-07-29 ENCOUNTER — Encounter (HOSPITAL_COMMUNITY): Payer: Self-pay

## 2018-07-29 ENCOUNTER — Emergency Department (HOSPITAL_COMMUNITY)
Admission: EM | Admit: 2018-07-29 | Discharge: 2018-07-29 | Disposition: A | Discharge status: Home / Self Care | Payer: Medicare Other | Attending: Emergency Medicine | Admitting: Emergency Medicine

## 2018-07-29 ENCOUNTER — Other Ambulatory Visit: Payer: Self-pay

## 2018-07-29 DIAGNOSIS — E039 Hypothyroidism, unspecified: Secondary | ICD-10-CM | POA: Diagnosis not present

## 2018-07-29 DIAGNOSIS — R0602 Shortness of breath: Secondary | ICD-10-CM

## 2018-07-29 DIAGNOSIS — I2510 Atherosclerotic heart disease of native coronary artery without angina pectoris: Secondary | ICD-10-CM | POA: Diagnosis not present

## 2018-07-29 DIAGNOSIS — I5032 Chronic diastolic (congestive) heart failure: Secondary | ICD-10-CM | POA: Diagnosis not present

## 2018-07-29 DIAGNOSIS — Z992 Dependence on renal dialysis: Secondary | ICD-10-CM | POA: Insufficient documentation

## 2018-07-29 DIAGNOSIS — I132 Hypertensive heart and chronic kidney disease with heart failure and with stage 5 chronic kidney disease, or end stage renal disease: Secondary | ICD-10-CM | POA: Diagnosis not present

## 2018-07-29 DIAGNOSIS — N186 End stage renal disease: Secondary | ICD-10-CM | POA: Insufficient documentation

## 2018-07-29 LAB — CBC WITH DIFFERENTIAL/PLATELET
Abs Immature Granulocytes: 0.04 10*3/uL (ref 0.00–0.07)
Basophils Absolute: 0 10*3/uL (ref 0.0–0.1)
Basophils Relative: 1 %
Eosinophils Absolute: 0.3 10*3/uL (ref 0.0–0.5)
Eosinophils Relative: 4 %
HCT: 42.6 % (ref 39.0–52.0)
Hemoglobin: 13.3 g/dL (ref 13.0–17.0)
Immature Granulocytes: 1 %
Lymphocytes Relative: 22 %
Lymphs Abs: 1.9 10*3/uL (ref 0.7–4.0)
MCH: 33.2 pg (ref 26.0–34.0)
MCHC: 31.2 g/dL (ref 30.0–36.0)
MCV: 106.2 fL — ABNORMAL HIGH (ref 80.0–100.0)
Monocytes Absolute: 0.7 10*3/uL (ref 0.1–1.0)
Monocytes Relative: 9 %
Neutro Abs: 5.5 10*3/uL (ref 1.7–7.7)
Neutrophils Relative %: 63 %
Platelets: 270 10*3/uL (ref 150–400)
RBC: 4.01 MIL/uL — ABNORMAL LOW (ref 4.22–5.81)
RDW: 14 % (ref 11.5–15.5)
WBC: 8.5 10*3/uL (ref 4.0–10.5)
nRBC: 0 % (ref 0.0–0.2)

## 2018-07-29 LAB — COMPREHENSIVE METABOLIC PANEL
ALT: 19 U/L (ref 0–44)
AST: 17 U/L (ref 15–41)
Albumin: 3.6 g/dL (ref 3.5–5.0)
Alkaline Phosphatase: 109 U/L (ref 38–126)
Anion gap: 17 — ABNORMAL HIGH (ref 5–15)
BUN: 59 mg/dL — ABNORMAL HIGH (ref 6–20)
CO2: 21 mmol/L — ABNORMAL LOW (ref 22–32)
Calcium: 9.3 mg/dL (ref 8.9–10.3)
Chloride: 90 mmol/L — ABNORMAL LOW (ref 98–111)
Creatinine, Ser: 10.48 mg/dL — ABNORMAL HIGH (ref 0.61–1.24)
GFR calc Af Amer: 6 mL/min — ABNORMAL LOW (ref >60)
GFR calc non Af Amer: 5 mL/min — ABNORMAL LOW (ref >60)
Glucose, Bld: 311 mg/dL — ABNORMAL HIGH (ref 70–99)
Potassium: 4.7 mmol/L (ref 3.5–5.1)
Sodium: 128 mmol/L — ABNORMAL LOW (ref 135–145)
Total Bilirubin: 0.5 mg/dL (ref 0.3–1.2)
Total Protein: 8.1 g/dL (ref 6.5–8.1)

## 2018-07-29 LAB — TROPONIN I: Troponin I: <0.03 ng/mL (ref <0.03)

## 2018-07-29 MED ORDER — ALBUTEROL SULFATE HFA 108 (90 BASE) MCG/ACT IN AERS
2.0000 | INHALATION_SPRAY | Freq: Once | RESPIRATORY_TRACT | Status: AC
Start: 2018-07-29 — End: 2018-07-29
  Administered 2018-07-29: 2 via RESPIRATORY_TRACT
  Filled 2018-07-29: qty 6.7

## 2018-07-29 NOTE — ED Notes (Signed)
Patient transported to X-ray 

## 2018-07-29 NOTE — ED Notes (Signed)
Patient verbalizes understanding of discharge instructions. Opportunity for questioning and answers were provided. Armband removed by staff, pt discharged from ED, pt assisted to lobby in wheelchair to wait for wife.

## 2018-07-29 NOTE — ED Provider Notes (Signed)
Emergency Department Provider Note   I have reviewed the triage vital signs and the nursing notes.   HISTORY  Chief Complaint Nausea; Diarrhea; and Shortness of Breath   HPI Dale [name] is a 53 y.o. male who presents the emergency department today with multiple complaints.  Patient's had some nausea and diarrhea recently but he is doing fine from that standpoint but also has had significant worsening dyspnea on exertion.  Patient has end-stage renal disease on dialysis 3 times a week and last had a Friday which he got down to close to his dry weight however he has been having progressive worsening dyspnea when transferring.  He states that his room air saturations were in the 70s on his home pulse oximeter so he applied his own oxygen that he wears at night and could only get up to 78% and thus presented here for evaluation.  Patient is a little bit of short of breath at rest but mostly with exertion transferring from wheelchair and around the house.  No chest pain, cough, fevers.  He has a left AKA but has not noticed any significant right lower extremity swelling beyond normal.  No sick contacts. No other associated or modifying symptoms.    Past Medical History:  Diagnosis Date  . Anal fissure   . Anemia of chronic disease   . Arthritis    "hands, right knee" (03/19/2014) no cartlidge in right knee  . CAD (coronary artery disease)    a. Abnl nuc ->LHC (11/15):  mid to dist Dx 80%, mid RCA 99% (functional CTO), dist RCA 80% >> PCI: balloon angioplasty to mid RCA (could not deliver stent) - staged PCI Rotoblator atherectomy/DES to [facility]. b. NSTEMI 12/2015 s/p DES to diagonal.  . Cholecystitis    a. 08/27/2011  . Chronic diastolic CHF (congestive heart failure) ([place])    a. Echo 3/13:  EF 55-60%;  b. Echo (11/15):  Mild LVH, EF 60-65%, no RWMA, Gr 1 DD, MAC, mild LAE, normal RVF, mild RAE;  c.  Echo 5/16:  severe LVH, EF 55-60%, no RWMA, Gr 1 DD, MAC, mild LAE  . Claustrophobia    when things get around his face.   . Complication of anesthesia   . Difficult intubation    only once in 2015 at [facility] haven't had a problem since then  . Diverticulosis   . Esophageal dysmotility   . ESRD (end stage renal disease) ([place])    a. 1995 s/p cadaveric transplant w/ susbequent failure after 18 yrs;  b.Dialysis initiated 07/2011 [facility] M[facility]  . Fibromyalgia   . Gastroparesis   . GERD (gastroesophageal reflux disease)   . Gout    PMH  . History of blood transfusion   . HLD (hyperlipidemia)   . Hypertension   . Hypothyroidism   . Inguinal lymphadenopathy    a. bilateral - s/p biopsy 07/2011  . Insulin dependent diabetes mellitus (HCC)    Type I  . Monilial esophagitis ([place])   . Morbid obesity ([place])   . OSA (obstructive sleep apnea)    adjustable bed  . PAD (peripheral artery disease) (HCC)    a. s/p L BKA.  [name] Kitchen Pericardial effusion    a.  Small by CT 08/22/11;  b.  Large by CT 08/27/11. c. Not seen on 11/2015 echo.  . Pneumonia ~ 2007  . PONV (postoperative nausea and vomiting)    vomited once after a procedure.  . T1DM (type 1 diabetes mellitus) ([place])   .  Tracheobronchomalacia     Patient Active Problem List   Diagnosis Date Noted  . Upper airway cough syndrome 08/11/2017  . Morbid obesity due to excess calories ([place]) 08/11/2017  . Chronic respiratory failure with hypoxia ([place]) 08/10/2017  . GERD (gastroesophageal reflux disease) 07/29/2017  . Rash and nonspecific skin eruption 07/27/2017  . Contact dermatitis 07/27/2017  . ESRD on hemodialysis ([place])   . Sepsis ([place]) 07/12/2017  . T1DM (type 1 diabetes mellitus) ([place]) 07/12/2017  . Hypotension (arterial) 10/15/2016  . Heme positive stool   . IDDM with complications 84/69/6295  . Oxygen dependent 03/24/2016  . Visit for preventive health examination 03/24/2016  . Chronic diastolic heart failure ([place]) 02/25/2016  . Tracheobronchomalacia 02/04/2016  . Anemia of chronic disease   . Iron deficiency anemia    . Gastrointestinal hemorrhage with melena 12/21/2015  . Pulmonary edema 03/09/2015  . Chronic pain 03/09/2015  . HLD (hyperlipidemia) 11/27/2014  . ESRD on dialysis ([place]) 09/30/2014  . Coronary artery disease involving native coronary artery of native heart with unstable angina pectoris ([place]) 03/20/2014  . Allergic reaction to dye 03/20/2014  . NSTEMI (non-ST elevated myocardial infarction) ([place]) 03/19/2014  . HTN (hypertension) 02/18/2014  . Abdominal pain 08/27/2011  . Pericardial effusion 08/27/2011  . History of renal transplant 07/22/2011  . S/P BKA (below knee amputation) ([place]) 07/22/2011  . OSA (obstructive sleep apnea) 07/22/2011  . DOE (dyspnea on exertion) 07/22/2011    Past Surgical History:  Procedure Laterality Date  . A/V FISTULAGRAM Left 07/26/2016   Procedure: A/V Fistulagram;  Surgeon: Waynetta Sandy, MD;  Location: [place] [facility];  Service: Cardiovascular;  Laterality: Left;  . A/V FISTULAGRAM N/A 07/11/2017   Procedure: A/V FISTULAGRAM - Left AV;  Surgeon: Waynetta Sandy, MD;  Location: [place] [facility];  Service: Cardiovascular;  Laterality: N/A;  . AMPUTATION OF REPLICATED TOES Right   . ANGIOPLASTY  04/09/2014   RCA   . APPENDECTOMY  ~ 2006  . ARTERIOVENOUS GRAFT PLACEMENT Left 1993?   forearm  . ARTERIOVENOUS GRAFT PLACEMENT Right 1993?   leg  . ARTERIOVENOUS GRAFT PLACEMENT Left    Thigh  . Arteriovenous Graft Removed Left    Thigh  . AV FISTULA PLACEMENT  07/29/2011   Procedure: ARTERIOVENOUS (AV) FISTULA CREATION;  Surgeon: Mal Misty, MD;  Location: [place];  Service: Vascular;  Laterality: Right;  Brachial cephalic  . AV FISTULA PLACEMENT Left 01/20/2015   Procedure: LIGATION OF RIGHT ARTERIOVENOUS FISTULA WITH EXCISION OF ANEURYSM;  Surgeon: Angelia Mould, MD;  Location: [place];  Service: Vascular;  Laterality: Left;  . AV FISTULA PLACEMENT Left 04/30/2015   Procedure: CREATION OF LEFT ARM BRACHIO-CEPHALIC  ARTERIOVENOUS (AV) FISTULA ;  Surgeon: Angelia Mould, MD;  Location: [place];  Service: Vascular;  Laterality: Left;  . AV Graft PLACEMENT Left 1993?   "attempted one in my wrist; didn't take"  . BELOW KNEE LEG AMPUTATION Left 2010  . CARDIAC CATHETERIZATION  03/19/2014  . CARDIAC CATHETERIZATION  03/19/2014   Procedure: CORONARY BALLOON ANGIOPLASTY;  Surgeon: Burnell Blanks, MD;  Location: [facility] CATH LAB;  Service: Cardiovascular;;  . CARDIAC CATHETERIZATION N/A 01/01/2016   Procedure: Left Heart Cath and Coronary Angiography;  Surgeon: Troy Sine, MD;  Location: [place] [facility];  Service: Cardiovascular;  Laterality: N/A;  . CARDIAC CATHETERIZATION N/A 01/01/2016   Procedure: Coronary Stent Intervention;  Surgeon: Troy Sine, MD;  Location: [place] [facility];  Service: Cardiovascular;  Laterality: N/A;  . CARPAL TUNNEL RELEASE Bilateral after 2005  . CATARACT EXTRACTION W/ INTRAOCULAR LENS  IMPLANT, BILATERAL Bilateral 1990's  . COLONOSCOPY  08/29/2011   Procedure: COLONOSCOPY;  Surgeon: Jerene Bears, MD;  Location: [place];  Service: Gastroenterology;  Laterality: N/A;  . COLONOSCOPY WITH PROPOFOL N/A 04/26/2016   Procedure: COLONOSCOPY WITH PROPOFOL;  Surgeon: Jerene Bears, MD;  Location: [facility] ENDOSCOPY;  Service: Gastroenterology;  Laterality: N/A;  . CORONARY ANGIOPLASTY WITH STENT PLACEMENT  04/09/2014       ptca/des mid lad   . ESOPHAGOGASTRODUODENOSCOPY N/A 12/25/2015   Procedure: ESOPHAGOGASTRODUODENOSCOPY (EGD);  Surgeon: Mauri Pole, MD;  Location: [facility] ENDOSCOPY;  Service: Endoscopy;  Laterality: N/A;  bedside  . ESOPHAGOGASTRODUODENOSCOPY (EGD) WITH PROPOFOL N/A 04/26/2016   Procedure: ESOPHAGOGASTRODUODENOSCOPY (EGD) WITH PROPOFOL;  Surgeon: Jerene Bears, MD;  Location: [facility] ENDOSCOPY;  Service: Gastroenterology;  Laterality: N/A;  . EYE SURGERY Bilateral    cataract  . FEMORAL ARTERY REPAIR  04/09/2014   ANGIOSEAL  . INSERTION OF DIALYSIS CATHETER  08/01/2011    Procedure: INSERTION OF DIALYSIS CATHETER;  Surgeon: Rosetta Posner, MD;  Location: [place];  Service: Vascular;  Laterality: Right;  insertion of dialysis catheter on right internal jugular vein  . INSERTION OF DIALYSIS CATHETER Right 01/20/2015   Procedure: INSERTION OF DIALYSIS CATHETER - RIGHT INTERNAL JUGULAR ;  Surgeon: Angelia Mould, MD;  Location: [place];  Service: Vascular;  Laterality: Right;  . KIDNEY TRANSPLANT  04/25/1993  . LEFT HEART CATHETERIZATION WITH CORONARY ANGIOGRAM N/A 03/19/2014   Procedure: LEFT HEART CATHETERIZATION WITH CORONARY ANGIOGRAM;  Surgeon: Burnell Blanks, MD;  Location: [facility] CATH LAB;  Service: Cardiovascular;  Laterality: N/A;  . LYMPH NODE BIOPSY  08/10/2011   Procedure: LYMPH NODE BIOPSY;  Surgeon: Merrie Roof, MD;  Location: [place];  Service: General;  Laterality: Left;  left groin lymph biopsy  . NEUROPLASTY / TRANSPOSITION ULNAR NERVE AT ELBOW Left after 2005  . PATCH ANGIOPLASTY Left 08/09/2016   Procedure: LEFT  ARTERIOVENOUS FISTULA PATCH ANGIOPLASTY;  Surgeon: Waynetta Sandy, MD;  Location: [place];  Service: Vascular;  Laterality: Left;  . PERCUTANEOUS CORONARY ROTOBLATOR INTERVENTION (PCI-R) N/A 04/09/2014   Procedure: PERCUTANEOUS CORONARY ROTOBLATOR INTERVENTION (PCI-R);  Surgeon: Burnell Blanks, MD;  Location: [facility] CATH LAB;  Service: Cardiovascular;  Laterality: N/A;  . PERIPHERAL VASCULAR BALLOON ANGIOPLASTY  07/11/2017   Procedure: PERIPHERAL VASCULAR BALLOON ANGIOPLASTY;  Surgeon: Waynetta Sandy, MD;  Location: [place] CV [facility];  Service: Cardiovascular;;  left arm AV fistula  . PERIPHERAL VASCULAR CATHETERIZATION N/A 12/25/2014   Procedure: Upper Extremity Venography;  Surgeon: Conrad Baxter, MD;  Location: [place] [facility];  Service: Cardiovascular;  Laterality: N/A;  . PERIPHERAL VASCULAR CATHETERIZATION Left 02/24/2016   Procedure: Fistulagram;  Surgeon: Waynetta Sandy, MD;  Location: [place]  CV LAB;  Service: Cardiovascular;  Laterality: Left;  . PERIPHERAL VASCULAR CATHETERIZATION Left 02/24/2016   Procedure: Peripheral Vascular Intervention;  Surgeon: Waynetta Sandy, MD;  Location: [place] [facility];  Service: Cardiovascular;  Laterality: Left;  AV FISTULA  . PERITONEAL CATHETER INSERTION    . PERITONEAL CATHETER REMOVAL    . REVISON OF ARTERIOVENOUS FISTULA Right 08/01/1446   Procedure: PLICATION / REVISION OF ARTERIOVENOUS FISTULA;  Surgeon: Angelia Mould, MD;  Location: [place];  Service: Vascular;  Laterality: Right;  . REVISON OF ARTERIOVENOUS FISTULA Left 09/01/2015   Procedure: SUPERFICIALIZATION OF LEFT ARM BRACHIOCEPHALIC ARTERIOVENOUS FISTULA;  Surgeon: Angelia Mould, MD;  Location: [place];  Service: Vascular;  Laterality: Left;  . REVISON OF ARTERIOVENOUS FISTULA Left 08/09/2016   Procedure: REVISION OF ARTERIAL ANASTOMOSIS OF ARTERIOVENOUS FISTULA;  Surgeon: Waynetta Sandy, MD;  Location: [place];  Service: Vascular;  Laterality: Left;  . SHOULDER ARTHROSCOPY Left ~ 2006   frozen  . TOE AMPUTATION Bilateral after 2005  . UMBILICAL HERNIA REPAIR  1990's  . VENOGRAM Right 07/28/2011   Procedure: VENOGRAM;  Surgeon: Conrad Shelburn, MD;  Location: [facility] CATH LAB;  Service: Cardiovascular;  Laterality: Right;  . VITRECTOMY Bilateral     Current Outpatient Rx  . Order #: [id number] Class: Normal  . Order #: [id number] Class: Print  . Order #: [id number] Class: Historical Med  . Order #: [id number] Class: Historical Med  . Order #: [id number] Class: Historical Med  . Order #: [id number] Class: Normal  . Order #: [id number] Class: Historical Med  . Order #: [id number] Class: Historical Med  . Order #: [id number] Class: Historical Med  . Order #: [id number] Class: Historical Med  . Order #: [id number] Class: Normal  . Order #: [id number] Class: Historical Med  . Order #: [id number] Class: Print  . Order #: [id number] Class: Historical Med  . Order #: [id number] Class: Print   . Order #: [id number] Class: Historical Med  . Order #: [id number] Class: Historical Med  . Order #: [id number] Class: No Print  . Order #: [id number] Class: Historical Med  . Order #: [id number] Class: Normal  . Order #: [id number] Class: Normal  . Order #: [id number] Class: Historical Med  . Order #: [id number] Class: Print  . Order #: [id number] Class: Normal  . Order #: [id number] Class: Normal  . Order #: [id number] Class: Historical Med  . Order #: [id number] Class: Historical Med    Allergies Iodinated diagnostic agents; Prednisone; Adhesive [tape]; Amoxicillin; Clindamycin/lincomycin; Enalapril; Mobic [meloxicam]; Morphine; Vancomycin; Brilinta [ticagrelor]; Ciprofloxacin; Clopidogrel; Codeine; Doxycycline; Hydrocodone; Levofloxacin; Oxycodone; and Rocephin [ceftriaxone]  Family History  Problem Relation Age of Onset  . Colon polyps Father   . Diabetes Father   . Hypertension Father   . Hypertension Mother   . Diabetes Mother   . Hyperlipidemia Mother   . Breast cancer Maternal Aunt   . Bone cancer Maternal Aunt   . Liver cancer Maternal Aunt   . Malignant hyperthermia Neg Hx   . Heart attack Neg Hx   . Stroke Neg Hx   . Colon cancer Neg Hx   . Stomach cancer Neg Hx   . Pancreatic cancer Neg Hx   . Esophageal cancer Neg Hx     Social History Social History   Tobacco Use  . Smoking status: Never Smoker  . Smokeless tobacco: Never Used  Substance Use Topics  . Alcohol use: Yes    Comment: occasional  . Drug use: No    Review of Systems  All other systems negative except as documented in the HPI. All pertinent positives and negatives as reviewed in the HPI. ____________________________________________   PHYSICAL EXAM:  VITAL SIGNS: ED Triage Vitals  Enc Vitals Group     BP 07/29/18 0010 (!) 122/53     Pulse Rate 07/29/18 0010 90     Resp 07/29/18 0010 19     Temp 07/29/18 0010 98.3 F (36.8 C)     Temp Source 07/29/18 0010 Oral     SpO2 07/29/18 0010 96 %     Weight  07/29/18 0040 278 lb 3.5 oz (126.2 kg)     Height 07/29/18 0040 _0  (1.727 m)     Head Circumference --  Peak Flow --      Pain Score 07/29/18 0040 0     Pain Loc --      Pain Edu? --      Excl. in [place]? --     Constitutional: Alert and oriented. Well appearing and in no acute distress. Eyes: Conjunctivae are normal. PERRL. EOMI. Head: Atraumatic. ose: No congestion/rhinnorhea. Mouth/Throat: Mucous membranes are moist.  Oropharynx non-erythematous. Neck: No stridor.  No meningeal signs.   Cardiovascular: Normal rate, regular rhythm. Good peripheral circulation. Grossly normal heart sounds.   Respiratory: Normal respiratory effort.  No retractions. Lungs diminished. Gastrointestinal: Soft and nontender. No distention.  Musculoskeletal: No lower extremity tenderness nor edema. No gross deformities of extremities. Neurologic:  Normal speech and language. No gross focal neurologic deficits are appreciated.  Skin:  Skin is warm, dry and intact. No rash noted.   ____________________________________________   LABS (all labs ordered are listed, but only abnormal results are displayed)  Labs Reviewed  CBC WITH DIFFERENTIAL/PLATELET - Abnormal; Notable for the following components:      Result Value   RBC 4.01 (*)    MCV 106.2 (*)    All other components within normal limits  COMPREHENSIVE METABOLIC PANEL - Abnormal; Notable for the following components:   Sodium 128 (*)    Chloride 90 (*)    CO2 21 (*)    Glucose, Bld 311 (*)    BUN 59 (*)    Creatinine, Ser 10.48 (*)    GFR calc non Af Amer 5 (*)    GFR calc Af Amer 6 (*)    Anion gap 17 (*)    All other components within normal limits  TROPONIN I   ____________________________________________  EKG   EKG Interpretation  Date/Time:  Sunday July 29 2018 00:52:29 EDT Ventricular Rate:  88 PR Interval:    QRS Duration: 91 QT Interval:  351 QTC Calculation: 425 R Axis:   64 Text Interpretation:  Sinus rhythm No  significant change since last tracing Confirmed by Merrily Pew [phone]) on 07/29/2018 1:51:45 AM       ____________________________________________  RADIOLOGY  Dg Chest 2 View  Result Date: 07/29/2018 CLINICAL DATA:  Diarrhea 5 times today.  Shortness of breath. EXAM: CHEST - 2 VIEW COMPARISON:  August 10, 2017 FINDINGS: Stable cardiomegaly. The hila, mediastinum, lungs, and pleura are unremarkable. IMPRESSION: No active cardiopulmonary disease. Electronically Signed   By: Dorise Bullion III M.D   On: 07/29/2018 01:33    ____________________________________________   PROCEDURES  Procedure(s) performed:   Procedures   ____________________________________________   INITIAL IMPRESSION / ASSESSMENT AND PLAN / ED COURSE  Dale [name] was evaluated in Emergency Department on 07/29/2018 for the symptoms described in the history of present illness. He was evaluated in the context of the global COVID-19 pandemic, which necessitated consideration that the patient might be at risk for infection with the SARS-CoV-2 virus that causes COVID-19. Institutional protocols and algorithms that pertain to the evaluation of patients at risk for COVID-19 are in a state of rapid change based on information released by regulatory bodies including the CDC and federal and state organizations. These policies and algorithms were followed during the patient's care in the ED.  No sick contacts to suggest flu were novel coronavirus at this time.  Patient with history of end-stage renal disease on dialysis but does not seem fluid overloaded.  Low suspicion for pulmonary embolus his saturations here are really are not as bad as they were  at home.  Possibly cardiac in nature.  Will evaluate appropriately.  We will try an albuterol inhaler as his lungs are diminished.  Monitored here for multiple hours without any hypoxia.  Patient appears well is not in any significant respiratory distress.  Did not really get much  relief with albuterol inhaler however his lungs do sound a little bit more aerated.  No evidence of fluid overload on exam or imaging.  No evidence of heart failure exacerbation at this time but would recommend following up with cardiology since his dyspnea seems to be worse with exertion.  Return here for any worsening symptoms.  Low suspicion for any infectious causes at this time.     Pertinent labs & imaging results that were available during my care of the patient were reviewed by me and considered in my medical decision making (see chart for details).  ____________________________________________  FINAL CLINICAL IMPRESSION(S) / ED DIAGNOSES  Final diagnoses:  SOB (shortness of breath)     MEDICATIONS GIVEN DURING THIS VISIT:  Medications  albuterol (PROVENTIL HFA;VENTOLIN HFA) 108 (90 Base) MCG/ACT inhaler 2 puff (2 puffs Inhalation Given 07/29/18 0059)     NEW OUTPATIENT MEDICATIONS STARTED DURING THIS VISIT:  Discharge Medication List as of 07/29/2018  3:47 AM      Note:  This note was prepared with assistance of Dragon voice recognition software. Occasional wrong-word or sound-a-like substitutions may have occurred due to the inherent limitations of voice recognition software.   Keyarah Mcroy, Corene Cornea, MD 07/29/18 [phone]

## 2018-07-29 NOTE — ED Notes (Signed)
Pt restricted on left arm. Bracelet in place.

## 2018-07-29 NOTE — ED Triage Notes (Signed)
Pt reports having diarrhea 5x today. And shortness of breath and winded easily. Pt is on o2 at night. Pt uses an o2 meter at home. Reports o2 sat of 75. Denies chest pain.

## 2018-07-29 NOTE — ED Notes (Signed)
Pt given water and sandwich.

## 2018-07-30 DIAGNOSIS — N186 End stage renal disease: Secondary | ICD-10-CM | POA: Diagnosis not present

## 2018-07-30 DIAGNOSIS — N2581 Secondary hyperparathyroidism of renal origin: Secondary | ICD-10-CM | POA: Diagnosis not present

## 2018-07-30 DIAGNOSIS — D689 Coagulation defect, unspecified: Secondary | ICD-10-CM | POA: Diagnosis not present

## 2018-07-30 DIAGNOSIS — D631 Anemia in chronic kidney disease: Secondary | ICD-10-CM | POA: Diagnosis not present

## 2018-07-30 DIAGNOSIS — E1022 Type 1 diabetes mellitus with diabetic chronic kidney disease: Secondary | ICD-10-CM | POA: Diagnosis not present

## 2018-07-30 DIAGNOSIS — T82818A Embolism of vascular prosthetic devices, implants and grafts, initial encounter: Secondary | ICD-10-CM | POA: Diagnosis not present

## 2018-08-01 ENCOUNTER — Inpatient Hospital Stay (HOSPITAL_COMMUNITY)
Admission: EM | Admit: 2018-08-01 | Discharge: 2018-08-03 | DRG: 252 | Disposition: A | Discharge status: Home / Self Care | Payer: Medicare Other | Attending: Family Medicine | Admitting: Family Medicine

## 2018-08-01 ENCOUNTER — Encounter (HOSPITAL_COMMUNITY): Payer: Self-pay | Admitting: Emergency Medicine

## 2018-08-01 ENCOUNTER — Other Ambulatory Visit: Payer: Self-pay

## 2018-08-01 DIAGNOSIS — I2510 Atherosclerotic heart disease of native coronary artery without angina pectoris: Secondary | ICD-10-CM | POA: Diagnosis not present

## 2018-08-01 DIAGNOSIS — T82868A Thrombosis of vascular prosthetic devices, implants and grafts, initial encounter: Secondary | ICD-10-CM | POA: Diagnosis not present

## 2018-08-01 DIAGNOSIS — M898X9 Other specified disorders of bone, unspecified site: Secondary | ICD-10-CM | POA: Diagnosis present

## 2018-08-01 DIAGNOSIS — E291 Testicular hypofunction: Secondary | ICD-10-CM | POA: Diagnosis present

## 2018-08-01 DIAGNOSIS — E1051 Type 1 diabetes mellitus with diabetic peripheral angiopathy without gangrene: Secondary | ICD-10-CM | POA: Diagnosis present

## 2018-08-01 DIAGNOSIS — Y832 Surgical operation with anastomosis, bypass or graft as the cause of abnormal reaction of the patient, or of later complication, without mention of misadventure at the time of the procedure: Secondary | ICD-10-CM | POA: Diagnosis present

## 2018-08-01 DIAGNOSIS — R34 Anuria and oliguria: Secondary | ICD-10-CM | POA: Diagnosis present

## 2018-08-01 DIAGNOSIS — T8612 Kidney transplant failure: Secondary | ICD-10-CM | POA: Diagnosis not present

## 2018-08-01 DIAGNOSIS — Z8349 Family history of other endocrine, nutritional and metabolic diseases: Secondary | ICD-10-CM

## 2018-08-01 DIAGNOSIS — E875 Hyperkalemia: Secondary | ICD-10-CM

## 2018-08-01 DIAGNOSIS — T82818A Embolism of vascular prosthetic devices, implants and grafts, initial encounter: Secondary | ICD-10-CM | POA: Diagnosis not present

## 2018-08-01 DIAGNOSIS — D631 Anemia in chronic kidney disease: Secondary | ICD-10-CM | POA: Diagnosis present

## 2018-08-01 DIAGNOSIS — E10319 Type 1 diabetes mellitus with unspecified diabetic retinopathy without macular edema: Secondary | ICD-10-CM | POA: Diagnosis present

## 2018-08-01 DIAGNOSIS — Z79890 Hormone replacement therapy (postmenopausal): Secondary | ICD-10-CM

## 2018-08-01 DIAGNOSIS — Z955 Presence of coronary angioplasty implant and graft: Secondary | ICD-10-CM

## 2018-08-01 DIAGNOSIS — Y830 Surgical operation with transplant of whole organ as the cause of abnormal reaction of the patient, or of later complication, without mention of misadventure at the time of the procedure: Secondary | ICD-10-CM | POA: Diagnosis present

## 2018-08-01 DIAGNOSIS — M5430 Sciatica, unspecified side: Secondary | ICD-10-CM | POA: Diagnosis present

## 2018-08-01 DIAGNOSIS — Z9641 Presence of insulin pump (external) (internal): Secondary | ICD-10-CM | POA: Diagnosis present

## 2018-08-01 DIAGNOSIS — G8929 Other chronic pain: Secondary | ICD-10-CM | POA: Diagnosis not present

## 2018-08-01 DIAGNOSIS — L8990 Pressure ulcer of unspecified site, unspecified stage: Secondary | ICD-10-CM

## 2018-08-01 DIAGNOSIS — D539 Nutritional anemia, unspecified: Secondary | ICD-10-CM | POA: Diagnosis not present

## 2018-08-01 DIAGNOSIS — N2581 Secondary hyperparathyroidism of renal origin: Secondary | ICD-10-CM | POA: Diagnosis present

## 2018-08-01 DIAGNOSIS — G4733 Obstructive sleep apnea (adult) (pediatric): Secondary | ICD-10-CM | POA: Diagnosis not present

## 2018-08-01 DIAGNOSIS — N186 End stage renal disease: Secondary | ICD-10-CM | POA: Diagnosis present

## 2018-08-01 DIAGNOSIS — I132 Hypertensive heart and chronic kidney disease with heart failure and with stage 5 chronic kidney disease, or end stage renal disease: Secondary | ICD-10-CM | POA: Diagnosis present

## 2018-08-01 DIAGNOSIS — Z8249 Family history of ischemic heart disease and other diseases of the circulatory system: Secondary | ICD-10-CM

## 2018-08-01 DIAGNOSIS — Z419 Encounter for procedure for purposes other than remedying health state, unspecified: Secondary | ICD-10-CM

## 2018-08-01 DIAGNOSIS — E1065 Type 1 diabetes mellitus with hyperglycemia: Secondary | ICD-10-CM | POA: Diagnosis present

## 2018-08-01 DIAGNOSIS — Z833 Family history of diabetes mellitus: Secondary | ICD-10-CM

## 2018-08-01 DIAGNOSIS — E039 Hypothyroidism, unspecified: Secondary | ICD-10-CM | POA: Diagnosis not present

## 2018-08-01 DIAGNOSIS — E1022 Type 1 diabetes mellitus with diabetic chronic kidney disease: Secondary | ICD-10-CM | POA: Diagnosis present

## 2018-08-01 DIAGNOSIS — M1A9XX0 Chronic gout, unspecified, without tophus (tophi): Secondary | ICD-10-CM | POA: Diagnosis present

## 2018-08-01 DIAGNOSIS — I5032 Chronic diastolic (congestive) heart failure: Secondary | ICD-10-CM | POA: Diagnosis not present

## 2018-08-01 DIAGNOSIS — Z992 Dependence on renal dialysis: Secondary | ICD-10-CM | POA: Diagnosis not present

## 2018-08-01 DIAGNOSIS — E8770 Fluid overload, unspecified: Secondary | ICD-10-CM | POA: Diagnosis present

## 2018-08-01 DIAGNOSIS — T82898A Other specified complication of vascular prosthetic devices, implants and grafts, initial encounter: Secondary | ICD-10-CM | POA: Diagnosis not present

## 2018-08-01 DIAGNOSIS — E1043 Type 1 diabetes mellitus with diabetic autonomic (poly)neuropathy: Secondary | ICD-10-CM | POA: Diagnosis present

## 2018-08-01 DIAGNOSIS — Z89512 Acquired absence of left leg below knee: Secondary | ICD-10-CM

## 2018-08-01 DIAGNOSIS — R140 Abdominal distension (gaseous): Secondary | ICD-10-CM | POA: Diagnosis not present

## 2018-08-01 DIAGNOSIS — Z6841 Body Mass Index (BMI) 40.0 and over, adult: Secondary | ICD-10-CM

## 2018-08-01 DIAGNOSIS — K219 Gastro-esophageal reflux disease without esophagitis: Secondary | ICD-10-CM | POA: Diagnosis not present

## 2018-08-01 DIAGNOSIS — I739 Peripheral vascular disease, unspecified: Secondary | ICD-10-CM | POA: Diagnosis not present

## 2018-08-01 DIAGNOSIS — I120 Hypertensive chronic kidney disease with stage 5 chronic kidney disease or end stage renal disease: Secondary | ICD-10-CM | POA: Diagnosis not present

## 2018-08-01 DIAGNOSIS — I252 Old myocardial infarction: Secondary | ICD-10-CM | POA: Diagnosis not present

## 2018-08-01 DIAGNOSIS — Z91041 Radiographic dye allergy status: Secondary | ICD-10-CM

## 2018-08-01 DIAGNOSIS — Z7982 Long term (current) use of aspirin: Secondary | ICD-10-CM

## 2018-08-01 DIAGNOSIS — M1990 Unspecified osteoarthritis, unspecified site: Secondary | ICD-10-CM | POA: Diagnosis present

## 2018-08-01 DIAGNOSIS — E785 Hyperlipidemia, unspecified: Secondary | ICD-10-CM | POA: Diagnosis present

## 2018-08-01 DIAGNOSIS — E1069 Type 1 diabetes mellitus with other specified complication: Secondary | ICD-10-CM | POA: Diagnosis not present

## 2018-08-01 DIAGNOSIS — D689 Coagulation defect, unspecified: Secondary | ICD-10-CM | POA: Diagnosis not present

## 2018-08-01 DIAGNOSIS — M797 Fibromyalgia: Secondary | ICD-10-CM | POA: Diagnosis present

## 2018-08-01 LAB — CBC WITH DIFFERENTIAL/PLATELET
Abs Immature Granulocytes: 0.03 10*3/uL (ref 0.00–0.07)
Basophils Absolute: 0.1 10*3/uL (ref 0.0–0.1)
Basophils Relative: 1 %
Eosinophils Absolute: 0.3 10*3/uL (ref 0.0–0.5)
Eosinophils Relative: 4 %
HCT: 38.3 % — ABNORMAL LOW (ref 39.0–52.0)
Hemoglobin: 11.9 g/dL — ABNORMAL LOW (ref 13.0–17.0)
Immature Granulocytes: 0 %
Lymphocytes Relative: 21 %
Lymphs Abs: 1.5 10*3/uL (ref 0.7–4.0)
MCH: 33 pg (ref 26.0–34.0)
MCHC: 31.1 g/dL (ref 30.0–36.0)
MCV: 106.1 fL — ABNORMAL HIGH (ref 80.0–100.0)
Monocytes Absolute: 0.6 10*3/uL (ref 0.1–1.0)
Monocytes Relative: 8 %
Neutro Abs: 4.7 10*3/uL (ref 1.7–7.7)
Neutrophils Relative %: 66 %
Platelets: 251 10*3/uL (ref 150–400)
RBC: 3.61 MIL/uL — ABNORMAL LOW (ref 4.22–5.81)
RDW: 14 % (ref 11.5–15.5)
WBC: 7.2 10*3/uL (ref 4.0–10.5)
nRBC: 0 % (ref 0.0–0.2)

## 2018-08-01 LAB — COMPREHENSIVE METABOLIC PANEL
ALT: 15 U/L (ref 0–44)
AST: 16 U/L (ref 15–41)
Albumin: 3.3 g/dL — ABNORMAL LOW (ref 3.5–5.0)
Alkaline Phosphatase: 101 U/L (ref 38–126)
Anion gap: 16 — ABNORMAL HIGH (ref 5–15)
BUN: 72 mg/dL — ABNORMAL HIGH (ref 6–20)
CO2: 24 mmol/L (ref 22–32)
Calcium: 9.1 mg/dL (ref 8.9–10.3)
Chloride: 99 mmol/L (ref 98–111)
Creatinine, Ser: 12.85 mg/dL — ABNORMAL HIGH (ref 0.61–1.24)
GFR calc Af Amer: 5 mL/min — ABNORMAL LOW (ref >60)
GFR calc non Af Amer: 4 mL/min — ABNORMAL LOW (ref >60)
Glucose, Bld: 243 mg/dL — ABNORMAL HIGH (ref 70–99)
Potassium: 5.6 mmol/L — ABNORMAL HIGH (ref 3.5–5.1)
Sodium: 139 mmol/L (ref 135–145)
Total Bilirubin: 0.4 mg/dL (ref 0.3–1.2)
Total Protein: 7.3 g/dL (ref 6.5–8.1)

## 2018-08-01 NOTE — ED Notes (Signed)
Pt still c/o pain in his lt arm  No order for pain med yet

## 2018-08-01 NOTE — ED Triage Notes (Signed)
C/o L fistula clogged this morning at dialysis.  Reports pain and warmth to L upper arm.  Last dialysis on Monday.  Denies SOB.

## 2018-08-01 NOTE — ED Notes (Signed)
[phone] Phill Mutter, Emergency Contact

## 2018-08-01 NOTE — ED Notes (Signed)
Pt supposed ti be dialyzed 0600am today his fistula was clotted off  Apparently according to the pt all day they tried to get someone to  Remove the clot but was unable to get anyone  So he came here for help  Lt arm sl swollen good lt radial and lt a-c  Pt c/o pain bilateralamps

## 2018-08-02 ENCOUNTER — Inpatient Hospital Stay: Admit: 2018-08-02 | Payer: Medicare Other | Admitting: Vascular Surgery

## 2018-08-02 ENCOUNTER — Other Ambulatory Visit: Payer: Self-pay

## 2018-08-02 ENCOUNTER — Inpatient Hospital Stay (HOSPITAL_COMMUNITY): Payer: Medicare Other | Admitting: Anesthesiology

## 2018-08-02 ENCOUNTER — Encounter (HOSPITAL_COMMUNITY)
Admission: EM | Disposition: A | Discharge status: Home / Self Care | Payer: Self-pay | Source: Home / Self Care | Attending: Family Medicine

## 2018-08-02 ENCOUNTER — Telehealth: Payer: Self-pay | Admitting: *Deleted

## 2018-08-02 ENCOUNTER — Inpatient Hospital Stay (HOSPITAL_COMMUNITY): Payer: Medicare Other

## 2018-08-02 ENCOUNTER — Encounter (HOSPITAL_COMMUNITY): Payer: Self-pay | Admitting: *Deleted

## 2018-08-02 DIAGNOSIS — I2510 Atherosclerotic heart disease of native coronary artery without angina pectoris: Secondary | ICD-10-CM | POA: Diagnosis present

## 2018-08-02 DIAGNOSIS — Z992 Dependence on renal dialysis: Secondary | ICD-10-CM

## 2018-08-02 DIAGNOSIS — G8929 Other chronic pain: Secondary | ICD-10-CM | POA: Diagnosis present

## 2018-08-02 DIAGNOSIS — N2581 Secondary hyperparathyroidism of renal origin: Secondary | ICD-10-CM | POA: Diagnosis present

## 2018-08-02 DIAGNOSIS — E875 Hyperkalemia: Secondary | ICD-10-CM | POA: Diagnosis present

## 2018-08-02 DIAGNOSIS — G4733 Obstructive sleep apnea (adult) (pediatric): Secondary | ICD-10-CM | POA: Diagnosis present

## 2018-08-02 DIAGNOSIS — L8990 Pressure ulcer of unspecified site, unspecified stage: Secondary | ICD-10-CM

## 2018-08-02 DIAGNOSIS — T82868A Thrombosis of vascular prosthetic devices, implants and grafts, initial encounter: Secondary | ICD-10-CM | POA: Diagnosis present

## 2018-08-02 DIAGNOSIS — N186 End stage renal disease: Secondary | ICD-10-CM | POA: Diagnosis present

## 2018-08-02 DIAGNOSIS — D631 Anemia in chronic kidney disease: Secondary | ICD-10-CM | POA: Diagnosis present

## 2018-08-02 DIAGNOSIS — I252 Old myocardial infarction: Secondary | ICD-10-CM | POA: Diagnosis not present

## 2018-08-02 DIAGNOSIS — E1069 Type 1 diabetes mellitus with other specified complication: Secondary | ICD-10-CM | POA: Diagnosis not present

## 2018-08-02 DIAGNOSIS — T8612 Kidney transplant failure: Secondary | ICD-10-CM | POA: Diagnosis present

## 2018-08-02 DIAGNOSIS — E1043 Type 1 diabetes mellitus with diabetic autonomic (poly)neuropathy: Secondary | ICD-10-CM | POA: Diagnosis present

## 2018-08-02 DIAGNOSIS — E1065 Type 1 diabetes mellitus with hyperglycemia: Secondary | ICD-10-CM | POA: Diagnosis present

## 2018-08-02 DIAGNOSIS — M898X9 Other specified disorders of bone, unspecified site: Secondary | ICD-10-CM | POA: Diagnosis present

## 2018-08-02 DIAGNOSIS — E039 Hypothyroidism, unspecified: Secondary | ICD-10-CM | POA: Diagnosis present

## 2018-08-02 DIAGNOSIS — Z9641 Presence of insulin pump (external) (internal): Secondary | ICD-10-CM | POA: Diagnosis present

## 2018-08-02 DIAGNOSIS — E8770 Fluid overload, unspecified: Secondary | ICD-10-CM | POA: Diagnosis present

## 2018-08-02 DIAGNOSIS — T82898A Other specified complication of vascular prosthetic devices, implants and grafts, initial encounter: Secondary | ICD-10-CM

## 2018-08-02 DIAGNOSIS — E1051 Type 1 diabetes mellitus with diabetic peripheral angiopathy without gangrene: Secondary | ICD-10-CM | POA: Diagnosis present

## 2018-08-02 DIAGNOSIS — E10319 Type 1 diabetes mellitus with unspecified diabetic retinopathy without macular edema: Secondary | ICD-10-CM | POA: Diagnosis present

## 2018-08-02 DIAGNOSIS — I5032 Chronic diastolic (congestive) heart failure: Secondary | ICD-10-CM | POA: Diagnosis present

## 2018-08-02 DIAGNOSIS — D539 Nutritional anemia, unspecified: Secondary | ICD-10-CM | POA: Diagnosis present

## 2018-08-02 DIAGNOSIS — I132 Hypertensive heart and chronic kidney disease with heart failure and with stage 5 chronic kidney disease, or end stage renal disease: Secondary | ICD-10-CM | POA: Diagnosis present

## 2018-08-02 DIAGNOSIS — E291 Testicular hypofunction: Secondary | ICD-10-CM | POA: Diagnosis present

## 2018-08-02 DIAGNOSIS — Z6841 Body Mass Index (BMI) 40.0 and over, adult: Secondary | ICD-10-CM | POA: Diagnosis not present

## 2018-08-02 DIAGNOSIS — Y830 Surgical operation with transplant of whole organ as the cause of abnormal reaction of the patient, or of later complication, without mention of misadventure at the time of the procedure: Secondary | ICD-10-CM | POA: Diagnosis present

## 2018-08-02 DIAGNOSIS — E1022 Type 1 diabetes mellitus with diabetic chronic kidney disease: Secondary | ICD-10-CM | POA: Diagnosis present

## 2018-08-02 DIAGNOSIS — Y832 Surgical operation with anastomosis, bypass or graft as the cause of abnormal reaction of the patient, or of later complication, without mention of misadventure at the time of the procedure: Secondary | ICD-10-CM | POA: Diagnosis present

## 2018-08-02 HISTORY — PX: THROMBECTOMY W/ EMBOLECTOMY: SHX2507

## 2018-08-02 HISTORY — PX: INSERTION OF DIALYSIS CATHETER: SHX1324

## 2018-08-02 LAB — RENAL FUNCTION PANEL
Albumin: 3.1 g/dL — ABNORMAL LOW (ref 3.5–5.0)
Albumin: 3.5 g/dL (ref 3.5–5.0)
Anion gap: 16 — ABNORMAL HIGH (ref 5–15)
Anion gap: 18 — ABNORMAL HIGH (ref 5–15)
BUN: 79 mg/dL — ABNORMAL HIGH (ref 6–20)
BUN: 82 mg/dL — ABNORMAL HIGH (ref 6–20)
CO2: 23 mmol/L (ref 22–32)
CO2: 23 mmol/L (ref 22–32)
Calcium: 8.8 mg/dL — ABNORMAL LOW (ref 8.9–10.3)
Calcium: 9.3 mg/dL (ref 8.9–10.3)
Chloride: 98 mmol/L (ref 98–111)
Chloride: 96 mmol/L — ABNORMAL LOW (ref 98–111)
Creatinine, Ser: 13.83 mg/dL — ABNORMAL HIGH (ref 0.61–1.24)
Creatinine, Ser: 14.31 mg/dL — ABNORMAL HIGH (ref 0.61–1.24)
GFR calc Af Amer: 4 mL/min — ABNORMAL LOW (ref >60)
GFR calc Af Amer: 4 mL/min — ABNORMAL LOW (ref >60)
GFR calc non Af Amer: 4 mL/min — ABNORMAL LOW (ref >60)
GFR calc non Af Amer: 3 mL/min — ABNORMAL LOW (ref >60)
Glucose, Bld: 253 mg/dL — ABNORMAL HIGH (ref 70–99)
Glucose, Bld: 197 mg/dL — ABNORMAL HIGH (ref 70–99)
Phosphorus: 7.5 mg/dL — ABNORMAL HIGH (ref 2.5–4.6)
Phosphorus: 6.5 mg/dL — ABNORMAL HIGH (ref 2.5–4.6)
Potassium: 6.1 mmol/L — ABNORMAL HIGH (ref 3.5–5.1)
Potassium: 4.9 mmol/L (ref 3.5–5.1)
Sodium: 137 mmol/L (ref 135–145)
Sodium: 137 mmol/L (ref 135–145)

## 2018-08-02 LAB — GLUCOSE, CAPILLARY
Glucose-Capillary: 183 mg/dL — ABNORMAL HIGH (ref 70–99)
Glucose-Capillary: 258 mg/dL — ABNORMAL HIGH (ref 70–99)
Glucose-Capillary: 321 mg/dL — ABNORMAL HIGH (ref 70–99)
Glucose-Capillary: 271 mg/dL — ABNORMAL HIGH (ref 70–99)
Glucose-Capillary: 103 mg/dL — ABNORMAL HIGH (ref 70–99)
Glucose-Capillary: 69 mg/dL — ABNORMAL LOW (ref 70–99)
Glucose-Capillary: 96 mg/dL (ref 70–99)

## 2018-08-02 LAB — TSH: TSH: 3.816 u[IU]/mL (ref 0.350–4.500)

## 2018-08-02 LAB — CBC
HCT: 36.7 % — ABNORMAL LOW (ref 39.0–52.0)
Hemoglobin: 11.3 g/dL — ABNORMAL LOW (ref 13.0–17.0)
MCH: 32.7 pg (ref 26.0–34.0)
MCHC: 30.8 g/dL (ref 30.0–36.0)
MCV: 106.1 fL — ABNORMAL HIGH (ref 80.0–100.0)
Platelets: 232 10*3/uL (ref 150–400)
RBC: 3.46 MIL/uL — ABNORMAL LOW (ref 4.22–5.81)
RDW: 14.2 % (ref 11.5–15.5)
WBC: 7.5 10*3/uL (ref 4.0–10.5)
nRBC: 0 % (ref 0.0–0.2)

## 2018-08-02 LAB — APTT: aPTT: 36 seconds (ref 24–36)

## 2018-08-02 LAB — PROTIME-INR
INR: 1.1 (ref 0.8–1.2)
Prothrombin Time: 14.4 seconds (ref 11.4–15.2)

## 2018-08-02 LAB — MRSA PCR SCREENING: MRSA by PCR: POSITIVE — AB

## 2018-08-02 LAB — HIV ANTIBODY (ROUTINE TESTING W REFLEX): HIV Screen 4th Generation wRfx: NONREACTIVE

## 2018-08-02 LAB — MAGNESIUM: Magnesium: 2.7 mg/dL — ABNORMAL HIGH (ref 1.7–2.4)

## 2018-08-02 SURGERY — THROMBECTOMY ARTERIOVENOUS FISTULA
Anesthesia: General | Site: Chest | Laterality: Right

## 2018-08-02 MED ORDER — HEPARIN SODIUM (PORCINE) 1000 UNIT/ML IJ SOLN
INTRAMUSCULAR | Status: DC | PRN
  Administered 2018-08-02: 3400 [IU] via INTRAVENOUS

## 2018-08-02 MED ORDER — FENTANYL CITRATE (PF) 100 MCG/2ML IJ SOLN
25.0000 ug | INTRAMUSCULAR | Status: DC | PRN
  Administered 2018-08-02: 25 ug via INTRAVENOUS

## 2018-08-02 MED ORDER — MIDAZOLAM HCL 2 MG/2ML IJ SOLN
INTRAMUSCULAR | Status: DC | PRN
  Administered 2018-08-02: 2 mg via INTRAVENOUS

## 2018-08-02 MED ORDER — INSULIN GLARGINE 100 UNIT/ML ~~LOC~~ SOLN
10.0000 [IU] | Freq: Every day | SUBCUTANEOUS | Status: DC
  Filled 2018-08-02 (×2): qty 0.1

## 2018-08-02 MED ORDER — POLYETHYLENE GLYCOL 3350 17 G PO PACK
17.0000 g | PACK | Freq: Every day | ORAL | Status: DC
  Filled 2018-08-02: qty 1

## 2018-08-02 MED ORDER — ONDANSETRON HCL 4 MG/2ML IJ SOLN
INTRAMUSCULAR | Status: DC | PRN
  Administered 2018-08-02: 4 mg via INTRAVENOUS

## 2018-08-02 MED ORDER — DOXERCALCIFEROL 4 MCG/2ML IV SOLN
5.0000 ug | INTRAVENOUS | Status: DC
  Filled 2018-08-02 (×2): qty 4

## 2018-08-02 MED ORDER — DEXTROSE 50 % IV SOLN
INTRAVENOUS | Status: DC | PRN
  Administered 2018-08-02: 12.5 mL via INTRAVENOUS

## 2018-08-02 MED ORDER — DEXTROSE 50 % IV SOLN
50.0000 mL | Freq: Once | INTRAVENOUS | Status: AC
  Administered 2018-08-02: 50 mL via INTRAVENOUS

## 2018-08-02 MED ORDER — INSULIN ASPART 100 UNIT/ML ~~LOC~~ SOLN
10.0000 [IU] | Freq: Once | SUBCUTANEOUS | Status: AC
  Administered 2018-08-02: 10 [IU] via SUBCUTANEOUS

## 2018-08-02 MED ORDER — CALCIUM GLUCONATE-NACL 1-0.675 GM/50ML-% IV SOLN
1.0000 g | Freq: Once | INTRAVENOUS | Status: AC
  Administered 2018-08-02: 1000 mg via INTRAVENOUS
  Filled 2018-08-02: qty 50

## 2018-08-02 MED ORDER — INSULIN ASPART 100 UNIT/ML ~~LOC~~ SOLN: 0.0000 [IU] | SUBCUTANEOUS | Status: DC

## 2018-08-02 MED ORDER — PROTAMINE SULFATE 10 MG/ML IV SOLN
INTRAVENOUS | Status: DC | PRN
  Administered 2018-08-02: 40 mg via INTRAVENOUS

## 2018-08-02 MED ORDER — GABAPENTIN 100 MG PO CAPS
100.0000 mg | ORAL_CAPSULE | Freq: Every day | ORAL | Status: DC
  Administered 2018-08-02: 100 mg via ORAL
  Filled 2018-08-02: qty 1

## 2018-08-02 MED ORDER — ACETAMINOPHEN 325 MG PO TABS
650.0000 mg | ORAL_TABLET | Freq: Four times a day (QID) | ORAL | Status: DC | PRN
  Administered 2018-08-02: 650 mg via ORAL
  Filled 2018-08-02: qty 2

## 2018-08-02 MED ORDER — FENTANYL CITRATE (PF) 100 MCG/2ML IJ SOLN
INTRAMUSCULAR | Status: AC
  Filled 2018-08-02: qty 2

## 2018-08-02 MED ORDER — PHENYLEPHRINE 40 MCG/ML (10ML) SYRINGE FOR IV PUSH (FOR BLOOD PRESSURE SUPPORT)
PREFILLED_SYRINGE | INTRAVENOUS | Status: DC | PRN
  Administered 2018-08-02 (×2): 80 ug via INTRAVENOUS
  Administered 2018-08-02: 40 ug via INTRAVENOUS

## 2018-08-02 MED ORDER — HEPARIN SODIUM (PORCINE) 5000 UNIT/ML IJ SOLN: 5000.0000 [IU] | Freq: Three times a day (TID) | INTRAMUSCULAR | Status: DC

## 2018-08-02 MED ORDER — SODIUM CHLORIDE 0.9 % IV SOLN
INTRAVENOUS | Status: AC
  Filled 2018-08-02: qty 1.2

## 2018-08-02 MED ORDER — PROPOFOL 10 MG/ML IV BOLUS
INTRAVENOUS | Status: AC
  Filled 2018-08-02: qty 20

## 2018-08-02 MED ORDER — SODIUM CHLORIDE 0.9 % IV SOLN
INTRAVENOUS | Status: DC
  Administered 2018-08-02 (×2): via INTRAVENOUS

## 2018-08-02 MED ORDER — CHLORHEXIDINE GLUCONATE CLOTH 2 % EX PADS
6.0000 | MEDICATED_PAD | Freq: Every day | CUTANEOUS | Status: DC
  Administered 2018-08-03: 6 via TOPICAL

## 2018-08-02 MED ORDER — DEXTROSE 50 % IV SOLN: 1.0000 | Freq: Once | INTRAVENOUS | Status: DC

## 2018-08-02 MED ORDER — ACETAMINOPHEN 650 MG RE SUPP: 650.0000 mg | Freq: Four times a day (QID) | RECTAL | Status: DC | PRN

## 2018-08-02 MED ORDER — PROPOFOL 10 MG/ML IV BOLUS
INTRAVENOUS | Status: DC | PRN
  Administered 2018-08-02: 200 mg via INTRAVENOUS

## 2018-08-02 MED ORDER — SUGAMMADEX SODIUM 200 MG/2ML IV SOLN
INTRAVENOUS | Status: DC | PRN
  Administered 2018-08-02: 500 mg via INTRAVENOUS

## 2018-08-02 MED ORDER — INSULIN ASPART 100 UNIT/ML ~~LOC~~ SOLN: 0.0000 [IU] | Freq: Every day | SUBCUTANEOUS | Status: DC

## 2018-08-02 MED ORDER — INSULIN ASPART 100 UNIT/ML ~~LOC~~ SOLN
0.0000 [IU] | Freq: Three times a day (TID) | SUBCUTANEOUS | Status: DC
  Administered 2018-08-02: 5 [IU] via SUBCUTANEOUS

## 2018-08-02 MED ORDER — ROCURONIUM BROMIDE 10 MG/ML (PF) SYRINGE
PREFILLED_SYRINGE | INTRAVENOUS | Status: DC | PRN
  Administered 2018-08-02: 50 mg via INTRAVENOUS

## 2018-08-02 MED ORDER — PRASUGREL HCL 10 MG PO TABS
10.0000 mg | ORAL_TABLET | Freq: Every day | ORAL | Status: DC
  Administered 2018-08-02 – 2018-08-03 (×2): 10 mg via ORAL
  Filled 2018-08-02 (×2): qty 1

## 2018-08-02 MED ORDER — DEXAMETHASONE SODIUM PHOSPHATE 10 MG/ML IJ SOLN
INTRAMUSCULAR | Status: DC | PRN
  Administered 2018-08-02: 10 mg via INTRAVENOUS

## 2018-08-02 MED ORDER — HYDROMORPHONE HCL 2 MG PO TABS: 2.0000 mg | ORAL_TABLET | Freq: Two times a day (BID) | ORAL | Status: DC | PRN

## 2018-08-02 MED ORDER — HYDROMORPHONE HCL 2 MG PO TABS
2.0000 mg | ORAL_TABLET | Freq: Four times a day (QID) | ORAL | Status: DC | PRN
  Administered 2018-08-02: 2 mg via ORAL
  Filled 2018-08-02: qty 1

## 2018-08-02 MED ORDER — DOCUSATE SODIUM 100 MG PO CAPS
200.0000 mg | ORAL_CAPSULE | Freq: Two times a day (BID) | ORAL | Status: DC
  Administered 2018-08-02 – 2018-08-03 (×3): 200 mg via ORAL
  Filled 2018-08-02 (×3): qty 2

## 2018-08-02 MED ORDER — FENTANYL CITRATE (PF) 250 MCG/5ML IJ SOLN
INTRAMUSCULAR | Status: DC | PRN
  Administered 2018-08-02: 100 ug via INTRAVENOUS

## 2018-08-02 MED ORDER — LEVOTHYROXINE SODIUM 112 MCG PO TABS
112.0000 ug | ORAL_TABLET | Freq: Every evening | ORAL | Status: DC
  Administered 2018-08-02: 112 ug via ORAL
  Filled 2018-08-02: qty 1

## 2018-08-02 MED ORDER — INSULIN PUMP
Freq: Three times a day (TID) | SUBCUTANEOUS | Status: DC
  Administered 2018-08-02: 1.35 via SUBCUTANEOUS
  Administered 2018-08-03: 08:00:00 via SUBCUTANEOUS
  Administered 2018-08-03: 1.1 via SUBCUTANEOUS

## 2018-08-02 MED ORDER — HEPARIN SODIUM (PORCINE) 1000 UNIT/ML IJ SOLN
INTRAMUSCULAR | Status: DC | PRN
  Administered 2018-08-02: 10000 [IU] via INTRAVENOUS

## 2018-08-02 MED ORDER — ALLOPURINOL 100 MG PO TABS
200.0000 mg | ORAL_TABLET | Freq: Every day | ORAL | Status: DC
  Administered 2018-08-02 – 2018-08-03 (×2): 200 mg via ORAL
  Filled 2018-08-02 (×2): qty 2

## 2018-08-02 MED ORDER — SODIUM ZIRCONIUM CYCLOSILICATE 10 G PO PACK
10.0000 g | PACK | Freq: Once | ORAL | Status: AC
  Administered 2018-08-02: 10 g via ORAL
  Filled 2018-08-02: qty 1

## 2018-08-02 MED ORDER — ASPIRIN EC 81 MG PO TBEC
81.0000 mg | DELAYED_RELEASE_TABLET | Freq: Every day | ORAL | Status: DC
  Administered 2018-08-02: 81 mg via ORAL
  Filled 2018-08-02: qty 1

## 2018-08-02 MED ORDER — HYDROMORPHONE HCL 2 MG PO TABS
2.0000 mg | ORAL_TABLET | Freq: Once | ORAL | Status: AC
  Administered 2018-08-02: 2 mg via ORAL
  Filled 2018-08-02: qty 1

## 2018-08-02 MED ORDER — SUCCINYLCHOLINE CHLORIDE 200 MG/10ML IV SOSY
PREFILLED_SYRINGE | INTRAVENOUS | Status: DC | PRN
  Administered 2018-08-02: 120 mg via INTRAVENOUS

## 2018-08-02 MED ORDER — GABAPENTIN 100 MG PO CAPS: 100.0000 mg | ORAL_CAPSULE | Freq: Every day | ORAL | Status: DC

## 2018-08-02 MED ORDER — SODIUM CHLORIDE 0.9 % IV SOLN
INTRAVENOUS | Status: DC | PRN
  Administered 2018-08-02: 50 ug/min via INTRAVENOUS

## 2018-08-02 MED ORDER — HEPARIN SODIUM (PORCINE) 5000 UNIT/ML IJ SOLN
5000.0000 [IU] | Freq: Three times a day (TID) | INTRAMUSCULAR | Status: DC
  Filled 2018-08-02 (×2): qty 1

## 2018-08-02 MED ORDER — MIDAZOLAM HCL 2 MG/2ML IJ SOLN
INTRAMUSCULAR | Status: AC
  Filled 2018-08-02: qty 2

## 2018-08-02 MED ORDER — ACETAMINOPHEN 500 MG PO TABS
1000.0000 mg | ORAL_TABLET | Freq: Once | ORAL | Status: AC
  Administered 2018-08-02: 1000 mg via ORAL
  Filled 2018-08-02: qty 2

## 2018-08-02 MED ORDER — ATORVASTATIN CALCIUM 10 MG PO TABS
10.0000 mg | ORAL_TABLET | Freq: Every day | ORAL | Status: DC
  Administered 2018-08-02 – 2018-08-03 (×2): 10 mg via ORAL
  Filled 2018-08-02 (×2): qty 1

## 2018-08-02 MED ORDER — SODIUM CHLORIDE 0.9 % IV SOLN
INTRAVENOUS | Status: DC | PRN
  Administered 2018-08-02: 500 mL

## 2018-08-02 MED ORDER — INSULIN ASPART 100 UNIT/ML IV SOLN: 10.0000 [IU] | Freq: Once | INTRAVENOUS | Status: DC

## 2018-08-02 MED ORDER — DEXTROSE 5 % IV SOLN
INTRAVENOUS | Status: DC | PRN
  Administered 2018-08-02: 3 g via INTRAVENOUS

## 2018-08-02 MED ORDER — MUPIROCIN 2 % EX OINT
1.0000 "application " | TOPICAL_OINTMENT | Freq: Two times a day (BID) | CUTANEOUS | Status: DC
  Administered 2018-08-02 – 2018-08-03 (×3): 1 via NASAL
  Filled 2018-08-02 (×2): qty 22

## 2018-08-02 MED ORDER — SODIUM ZIRCONIUM CYCLOSILICATE 5 G PO PACK
5.0000 g | PACK | Freq: Once | ORAL | Status: DC
  Filled 2018-08-02: qty 1

## 2018-08-02 MED ORDER — LIDOCAINE 2% (20 MG/ML) 5 ML SYRINGE
INTRAMUSCULAR | Status: DC | PRN
  Administered 2018-08-02: 60 mg via INTRAVENOUS

## 2018-08-02 MED ORDER — ONDANSETRON HCL 4 MG/2ML IJ SOLN: 4.0000 mg | Freq: Four times a day (QID) | INTRAMUSCULAR | Status: DC | PRN

## 2018-08-02 MED ORDER — HEPARIN SODIUM (PORCINE) 1000 UNIT/ML IJ SOLN
INTRAMUSCULAR | Status: AC
  Filled 2018-08-02: qty 1

## 2018-08-02 MED ORDER — FENTANYL CITRATE (PF) 250 MCG/5ML IJ SOLN
INTRAMUSCULAR | Status: AC
  Filled 2018-08-02: qty 5

## 2018-08-02 MED ORDER — SODIUM CHLORIDE 0.9 % IR SOLN
Status: DC | PRN
  Administered 2018-08-02: 1000 mL

## 2018-08-02 MED ORDER — HYDROMORPHONE HCL 2 MG PO TABS
2.0000 mg | ORAL_TABLET | Freq: Two times a day (BID) | ORAL | Status: DC | PRN
  Administered 2018-08-02: 2 mg via ORAL
  Filled 2018-08-02: qty 1

## 2018-08-02 SURGICAL SUPPLY — 46 items
ADH SKN CLS APL DERMABOND .7 (GAUZE/BANDAGES/DRESSINGS) ×6
ARMBAND PINK RESTRICT EXTREMIT (MISCELLANEOUS) ×6 IMPLANT
BIOPATCH RED 1 DISK 7.0 (GAUZE/BANDAGES/DRESSINGS) ×1 IMPLANT
CANISTER SUCT 3000ML PPV (MISCELLANEOUS) ×3 IMPLANT
CANNULA VESSEL 3MM 2 BLNT TIP (CANNULA) ×3 IMPLANT
CATH EMB 4FR 80CM (CATHETERS) ×3 IMPLANT
CATH PALINDROME RT-P 15FX23CM (CATHETERS) ×1 IMPLANT
CLIP VESOCCLUDE MED 6/CT (CLIP) ×3 IMPLANT
CLIP VESOCCLUDE SM WIDE 6/CT (CLIP) ×3 IMPLANT
COVER TRANSDUCER ULTRASND GEL (DRAPE) ×1 IMPLANT
COVER WAND RF STERILE (DRAPES) ×3 IMPLANT
DERMABOND ADVANCED (GAUZE/BANDAGES/DRESSINGS) ×3
DERMABOND ADVANCED .7 DNX12 (GAUZE/BANDAGES/DRESSINGS) ×2 IMPLANT
DRAPE C-ARM 35X43 STRL (DRAPES) ×1 IMPLANT
DRAPE CHEST BREAST 15X10 FENES (DRAPES) ×1 IMPLANT
DRAPE X-RAY CASS 24X20 (DRAPES) IMPLANT
ELECT REM PT RETURN 9FT ADLT (ELECTROSURGICAL) ×3
ELECTRODE REM PT RTRN 9FT ADLT (ELECTROSURGICAL) ×2 IMPLANT
GAUZE 4X4 16PLY RFD (DISPOSABLE) IMPLANT
GAUZE SPONGE 4X4 12PLY STRL (GAUZE/BANDAGES/DRESSINGS) ×1 IMPLANT
GLOVE BIO SURGEON STRL SZ 6.5 (GLOVE) ×4 IMPLANT
GLOVE BIO SURGEON STRL SZ7.5 (GLOVE) ×4 IMPLANT
GLOVE BIOGEL PI IND STRL 8 (GLOVE) ×2 IMPLANT
GLOVE BIOGEL PI INDICATOR 8 (GLOVE) ×2
GOWN STRL REUS W/ TWL LRG LVL3 (GOWN DISPOSABLE) ×6 IMPLANT
GOWN STRL REUS W/TWL LRG LVL3 (GOWN DISPOSABLE) ×9
KIT BASIN OR (CUSTOM PROCEDURE TRAY) ×3 IMPLANT
KIT TURNOVER KIT B (KITS) ×3 IMPLANT
NS IRRIG 1000ML POUR BTL (IV SOLUTION) ×3 IMPLANT
PACK CV ACCESS (CUSTOM PROCEDURE TRAY) ×3 IMPLANT
PAD ARMBOARD 7.5X6 YLW CONV (MISCELLANEOUS) ×6 IMPLANT
SET COLLECT BLD 21X3/4 12 (NEEDLE) IMPLANT
SET MICROPUNCTURE 5F STIFF (MISCELLANEOUS) ×1 IMPLANT
SOAP 2 % CHG 4 OZ (WOUND CARE) ×1 IMPLANT
SPONGE SURGIFOAM ABS GEL 100 (HEMOSTASIS) IMPLANT
STOPCOCK 4 WAY LG BORE MALE ST (IV SETS) IMPLANT
SUT ETHILON 3 0 PS 1 (SUTURE) ×1 IMPLANT
SUT PROLENE 6 0 BV (SUTURE) ×3 IMPLANT
SUT VIC AB 3-0 SH 27 (SUTURE) ×3
SUT VIC AB 3-0 SH 27X BRD (SUTURE) ×2 IMPLANT
SUT VIC AB 4-0 PS2 18 (SUTURE) ×1 IMPLANT
SUT VICRYL 4-0 PS2 18IN ABS (SUTURE) ×3 IMPLANT
TOWEL GREEN STERILE (TOWEL DISPOSABLE) ×3 IMPLANT
TUBING EXTENTION W/L.L. (IV SETS) IMPLANT
UNDERPAD 30X30 (UNDERPADS AND DIAPERS) ×3 IMPLANT
WATER STERILE IRR 1000ML POUR (IV SOLUTION) ×3 IMPLANT

## 2018-08-02 NOTE — TOC Initial Note (Signed)
Transition of Care [facility]) - Initial/Assessment Note    Patient Details  Name: Dale [name] MRN: [id number] Date of Birth: 1965-08-12  Transition of Care [facility]) CM/SW Contact:    Bartholomew Crews, RN Phone Number: [phone] 08/02/2018, 2:23 PM  Clinical Narrative:                 Spoke with patient at bedside. Stated he was waiting for surgery. Stated his access is not working, then will need dialysis today and tomorrow, and he hopes to return home over weekend. States his wife takes good care of him. She provides transportation to and from dialysis. Denies any problems with medications or managing diabetes. Appreciative of CM visit. No transition of care needs identified at this time.   Expected Discharge Plan: Home/Self Care Barriers to Discharge: Continued Medical Work up   Patient Goals and CMS Choice     Choice offered to / list presented to : NA  Expected Discharge Plan and Services Expected Discharge Plan: Home/Self Care In-house Referral: NA Discharge Planning Services: CM Consult Post Acute Care Choice: NA   Expected Discharge Date: 08/06/18               DME Arranged: N/A DME Agency: NA HH Arranged: NA HH Agency: NA  Prior Living Arrangements/Services   Lives with:: Self, Spouse, Pets Patient language and need for interpreter reviewed:: Yes Do you feel safe going back to the place where you live?: Yes      Need for Family Participation in Patient Care: Yes (Comment) Care giver support system in place?: Yes (comment) Current home services: DME Criminal Activity/Legal Involvement Pertinent to Current Situation/Hospitalization: No - Comment as needed  Activities of Daily Living Home Assistive Devices/Equipment: Wheelchair, Prosthesis ADL Screening (condition at time of admission) Patient's cognitive ability adequate to safely complete daily activities?: Yes Is the patient deaf or have difficulty hearing?: No Does the patient have difficulty seeing, even when  wearing glasses/contacts?: No Does the patient have difficulty concentrating, remembering, or making decisions?: No Patient able to express need for assistance with ADLs?: Yes Does the patient have difficulty dressing or bathing?: No Independently performs ADLs?: Yes (appropriate for developmental age) Does the patient have difficulty walking or climbing stairs?: Yes Weakness of Legs: Left Weakness of Arms/Hands: None  Permission Sought/Granted                  Emotional Assessment Appearance:: Appears older than stated age Attitude/Demeanor/Rapport: Engaged Affect (typically observed): Happy Orientation: : Oriented to Self, Oriented to  Time, Oriented to Place, Oriented to Situation   Psych Involvement: No (comment)  Admission diagnosis:  Hyperkalemia [E87.5] Thrombosis of arteriovenous fistula, initial encounter [facility]) [H41.937T] Patient Active Problem List   Diagnosis Date Noted  . Clotted renal dialysis arteriovenous graft ([place]) 08/02/2018  . Pressure injury of skin 08/02/2018  . Upper airway cough syndrome 08/11/2017  . Morbid obesity due to excess calories ([place]) 08/11/2017  . Chronic respiratory failure with hypoxia ([place]) 08/10/2017  . GERD (gastroesophageal reflux disease) 07/29/2017  . Rash and nonspecific skin eruption 07/27/2017  . Contact dermatitis 07/27/2017  . ESRD on hemodialysis ([place])   . Sepsis ([place]) 07/12/2017  . T1DM (type 1 diabetes mellitus) ([place]) 07/12/2017  . Hypotension (arterial) 10/15/2016  . Heme positive stool   . IDDM with complications 02/40/9735  . Oxygen dependent 03/24/2016  . Visit for preventive health examination 03/24/2016  . Chronic diastolic heart failure ([place]) 02/25/2016  . Tracheobronchomalacia 02/04/2016  .  Anemia of chronic disease   . Iron deficiency anemia   . Gastrointestinal hemorrhage with melena 12/21/2015  . Pulmonary edema 03/09/2015  . Chronic pain 03/09/2015  . Hyperkalemia 03/09/2015  . HLD (hyperlipidemia)  11/27/2014  . ESRD on dialysis ([place]) 09/30/2014  . Coronary artery disease involving native coronary artery of native heart with unstable angina pectoris ([place]) 03/20/2014  . Allergic reaction to dye 03/20/2014  . NSTEMI (non-ST elevated myocardial infarction) ([place]) 03/19/2014  . HTN (hypertension) 02/18/2014  . ESRD (end stage renal disease) ([place]) 08/27/2011  . Abdominal pain 08/27/2011  . Pericardial effusion 08/27/2011  . History of renal transplant 07/22/2011  . S/P BKA (below knee amputation) ([place]) 07/22/2011  . OSA (obstructive sleep apnea) 07/22/2011  . DOE (dyspnea on exertion) 07/22/2011   PCP:  Bonnita Hollow, MD Pharmacy:   [facility] [address], [place] AT [facility] [address] [place] [address] Phone: [phone] Fax: [phone]     Social Determinants of Health (SDOH) Interventions    Readmission Risk Interventions No flowsheet data found.

## 2018-08-02 NOTE — ED Provider Notes (Signed)
[name] EMERGENCY DEPARTMENT Provider Note   CSN: [id number] Arrival date & time: 08/01/18  1930    History   Chief Complaint Chief Complaint  Patient presents with  . Vascular Access Problem    HPI Dale [name] is a 53 y.o. male.     HPI   53yo male presents with fistula problem . Reports he attempted to have dialysis today but when he arrved his fistual appeared thrombosed and they were not able to dialyze.  He reports he typically gets fistula care with vein and vascular due to contrast allergy for which he requires hospitalization and IV pretreatment, and today the dialysis team initially called vein and vascular center.  Reports from there, they recommended [facility] for further care, however they were closed when patient attempted to see them.  Reports he is worsening pain and symptoms in his left arm.  Also reports he feels he is due for dialysis with sensation of tightness/swelling across across arms and abdomen. Denies dyspnea, just slight sensationof tightness   Past Medical History:  Diagnosis Date  . Anal fissure   . Anemia of chronic disease   . Arthritis    "hands, right knee" (03/19/2014) no cartlidge in right knee  . CAD (coronary artery disease)    a. Abnl nuc ->LHC (11/15):  mid to dist Dx 80%, mid RCA 99% (functional CTO), dist RCA 80% >> PCI: balloon angioplasty to mid RCA (could not deliver stent) - staged PCI Rotoblator atherectomy/DES to [facility]. b. NSTEMI 12/2015 s/p DES to diagonal.  . Cholecystitis    a. 08/27/2011  . Chronic diastolic CHF (congestive heart failure) ([place])    a. Echo 3/13:  EF 55-60%;  b. Echo (11/15):  Mild LVH, EF 60-65%, no RWMA, Gr 1 DD, MAC, mild LAE, normal RVF, mild RAE;  c.  Echo 5/16:  severe LVH, EF 55-60%, no RWMA, Gr 1 DD, MAC, mild LAE  . Claustrophobia    when things get around his face.   . Complication of anesthesia   . Difficult intubation    only once in 2015 at [facility] haven't had a problem  since then  . Diverticulosis   . Esophageal dysmotility   . ESRD (end stage renal disease) ([place])    a. 1995 s/p cadaveric transplant w/ susbequent failure after 18 yrs;  b.Dialysis initiated 07/2011 [facility]  . Fibromyalgia   . Gastroparesis   . GERD (gastroesophageal reflux disease)   . Gout    PMH  . History of blood transfusion   . HLD (hyperlipidemia)   . Hypertension   . Hypothyroidism   . Inguinal lymphadenopathy    a. bilateral - s/p biopsy 07/2011  . Insulin dependent diabetes mellitus (HCC)    Type I  . Monilial esophagitis ([place])   . Morbid obesity ([place])   . OSA (obstructive sleep apnea)    adjustable bed  . PAD (peripheral artery disease) (HCC)    a. s/p L BKA.  [name] Kitchen Pericardial effusion    a.  Small by CT 08/22/11;  b.  Large by CT 08/27/11. c. Not seen on 11/2015 echo.  . Pneumonia ~ 2007  . PONV (postoperative nausea and vomiting)    vomited once after a procedure.  . T1DM (type 1 diabetes mellitus) ([place])   . Tracheobronchomalacia     Patient Active Problem List   Diagnosis Date Noted  . Clotted renal dialysis arteriovenous graft ([place]) 08/02/2018  . Upper airway cough  syndrome 08/11/2017  . Morbid obesity due to excess calories ([place]) 08/11/2017  . Chronic respiratory failure with hypoxia ([place]) 08/10/2017  . GERD (gastroesophageal reflux disease) 07/29/2017  . Rash and nonspecific skin eruption 07/27/2017  . Contact dermatitis 07/27/2017  . ESRD on hemodialysis ([place])   . Sepsis ([place]) 07/12/2017  . T1DM (type 1 diabetes mellitus) ([place]) 07/12/2017  . Hypotension (arterial) 10/15/2016  . Heme positive stool   . IDDM with complications 13/24/4010  . Oxygen dependent 03/24/2016  . Visit for preventive health examination 03/24/2016  . Chronic diastolic heart failure ([place]) 02/25/2016  . Tracheobronchomalacia 02/04/2016  . Anemia of chronic disease   . Iron deficiency anemia   . Gastrointestinal hemorrhage with melena 12/21/2015  . Pulmonary edema 03/09/2015   . Chronic pain 03/09/2015  . HLD (hyperlipidemia) 11/27/2014  . ESRD on dialysis ([place]) 09/30/2014  . Coronary artery disease involving native coronary artery of native heart with unstable angina pectoris ([place]) 03/20/2014  . Allergic reaction to dye 03/20/2014  . NSTEMI (non-ST elevated myocardial infarction) ([place]) 03/19/2014  . HTN (hypertension) 02/18/2014  . Abdominal pain 08/27/2011  . Pericardial effusion 08/27/2011  . History of renal transplant 07/22/2011  . S/P BKA (below knee amputation) ([place]) 07/22/2011  . OSA (obstructive sleep apnea) 07/22/2011  . DOE (dyspnea on exertion) 07/22/2011    Past Surgical History:  Procedure Laterality Date  . A/V FISTULAGRAM Left 07/26/2016   Procedure: A/V Fistulagram;  Surgeon: Waynetta Sandy, MD;  Location: [facility] CV LAB;  Service: Cardiovascular;  Laterality: Left;  . A/V FISTULAGRAM N/A 07/11/2017   Procedure: A/V FISTULAGRAM - Left AV;  Surgeon: Waynetta Sandy, MD;  Location: [facility] CV LAB;  Service: Cardiovascular;  Laterality: N/A;  . AMPUTATION OF REPLICATED TOES Right   . ANGIOPLASTY  04/09/2014   RCA   . APPENDECTOMY  ~ 2006  . ARTERIOVENOUS GRAFT PLACEMENT Left 1993?   forearm  . ARTERIOVENOUS GRAFT PLACEMENT Right 1993?   leg  . ARTERIOVENOUS GRAFT PLACEMENT Left    Thigh  . Arteriovenous Graft Removed Left    Thigh  . AV FISTULA PLACEMENT  07/29/2011   Procedure: ARTERIOVENOUS (AV) FISTULA CREATION;  Surgeon: Mal Misty, MD;  Location: [facility];  Service: Vascular;  Laterality: Right;  Brachial cephalic  . AV FISTULA PLACEMENT Left 01/20/2015   Procedure: LIGATION OF RIGHT ARTERIOVENOUS FISTULA WITH EXCISION OF ANEURYSM;  Surgeon: Angelia Mould, MD;  Location: [facility];  Service: Vascular;  Laterality: Left;  . AV FISTULA PLACEMENT Left 04/30/2015   Procedure: CREATION OF LEFT ARM BRACHIO-CEPHALIC ARTERIOVENOUS (AV) FISTULA ;  Surgeon: Angelia Mould, MD;  Location: [facility];  Service:  Vascular;  Laterality: Left;  . AV Graft PLACEMENT Left 1993?   "attempted one in my wrist; didn't take"  . BELOW KNEE LEG AMPUTATION Left 2010  . CARDIAC CATHETERIZATION  03/19/2014  . CARDIAC CATHETERIZATION  03/19/2014   Procedure: CORONARY BALLOON ANGIOPLASTY;  Surgeon: Burnell Blanks, MD;  Location: [facility] CATH LAB;  Service: Cardiovascular;;  . CARDIAC CATHETERIZATION N/A 01/01/2016   Procedure: Left Heart Cath and Coronary Angiography;  Surgeon: Troy Sine, MD;  Location: [facility] CV LAB;  Service: Cardiovascular;  Laterality: N/A;  . CARDIAC CATHETERIZATION N/A 01/01/2016   Procedure: Coronary Stent Intervention;  Surgeon: Troy Sine, MD;  Location: [facility] CV LAB;  Service: Cardiovascular;  Laterality: N/A;  . CARPAL TUNNEL RELEASE Bilateral after 2005  . CATARACT EXTRACTION W/ INTRAOCULAR LENS  IMPLANT,  BILATERAL Bilateral 1990's  . COLONOSCOPY  08/29/2011   Procedure: COLONOSCOPY;  Surgeon: Jerene Bears, MD;  Location: [facility];  Service: Gastroenterology;  Laterality: N/A;  . COLONOSCOPY WITH PROPOFOL N/A 04/26/2016   Procedure: COLONOSCOPY WITH PROPOFOL;  Surgeon: Jerene Bears, MD;  Location: [facility] ENDOSCOPY;  Service: Gastroenterology;  Laterality: N/A;  . CORONARY ANGIOPLASTY WITH STENT PLACEMENT  04/09/2014       ptca/des mid lad   . ESOPHAGOGASTRODUODENOSCOPY N/A 12/25/2015   Procedure: ESOPHAGOGASTRODUODENOSCOPY (EGD);  Surgeon: Mauri Pole, MD;  Location: [facility] ENDOSCOPY;  Service: Endoscopy;  Laterality: N/A;  bedside  . ESOPHAGOGASTRODUODENOSCOPY (EGD) WITH PROPOFOL N/A 04/26/2016   Procedure: ESOPHAGOGASTRODUODENOSCOPY (EGD) WITH PROPOFOL;  Surgeon: Jerene Bears, MD;  Location: [facility]OS[facility];  Service: Gastroenterology;  Laterality: N/A;  . EYE SURGERY Bilateral    cataract  . FEMORAL ARTERY REPAIR  04/09/2014   ANGIOSEAL  . INSERTION OF DIALYSIS CATHETER  08/01/2011   Procedure: INSERTION OF DIALYSIS CATHETER;  Surgeon: Rosetta Posner, MD;  Location: [facility];   Service: Vascular;  Laterality: Right;  insertion of dialysis catheter on right internal jugular vein  . INSERTION OF DIALYSIS CATHETER Right 01/20/2015   Procedure: INSERTION OF DIALYSIS CATHETER - RIGHT INTERNAL JUGULAR ;  Surgeon: Angelia Mould, MD;  Location: [facility];  Service: Vascular;  Laterality: Right;  . KIDNEY TRANSPLANT  04/25/1993  . LEFT HEART CATHETERIZATION WITH CORONARY ANGIOGRAM N/A 03/19/2014   Procedure: LEFT HEART CATHETERIZATION WITH CORONARY ANGIOGRAM;  Surgeon: Burnell Blanks, MD;  Location: [facility] CATH LAB;  Service: Cardiovascular;  Laterality: N/A;  . LYMPH NODE BIOPSY  08/10/2011   Procedure: LYMPH NODE BIOPSY;  Surgeon: Merrie Roof, MD;  Location: [facility];  Service: General;  Laterality: Left;  left groin lymph biopsy  . NEUROPLASTY / TRANSPOSITION ULNAR NERVE AT ELBOW Left after 2005  . PATCH ANGIOPLASTY Left 08/09/2016   Procedure: LEFT  ARTERIOVENOUS FISTULA PATCH ANGIOPLASTY;  Surgeon: Waynetta Sandy, MD;  Location: [facility];  Service: Vascular;  Laterality: Left;  . PERCUTANEOUS CORONARY ROTOBLATOR INTERVENTION (PCI-R) N/A 04/09/2014   Procedure: PERCUTANEOUS CORONARY ROTOBLATOR INTERVENTION (PCI-R);  Surgeon: Burnell Blanks, MD;  Location: [facility] CATH LAB;  Service: Cardiovascular;  Laterality: N/A;  . PERIPHERAL VASCULAR BALLOON ANGIOPLASTY  07/11/2017   Procedure: PERIPHERAL VASCULAR BALLOON ANGIOPLASTY;  Surgeon: Waynetta Sandy, MD;  Location: [place] CV LAB;  Service: Cardiovascular;;  left arm AV fistula  . PERIPHERAL VASCULAR CATHETERIZATION N/A 12/25/2014   Procedure: Upper Extremity Venography;  Surgeon: Conrad Gadsden, MD;  Location: [facility] CV LAB;  Service: Cardiovascular;  Laterality: N/A;  . PERIPHERAL VASCULAR CATHETERIZATION Left 02/24/2016   Procedure: Fistulagram;  Surgeon: Waynetta Sandy, MD;  Location: [facility] CV LAB;  Service: Cardiovascular;  Laterality: Left;  . PERIPHERAL VASCULAR CATHETERIZATION  Left 02/24/2016   Procedure: Peripheral Vascular Intervention;  Surgeon: Waynetta Sandy, MD;  Location: [facility] CV LAB;  Service: Cardiovascular;  Laterality: Left;  AV FISTULA  . PERITONEAL CATHETER INSERTION    . PERITONEAL CATHETER REMOVAL    . REVISON OF ARTERIOVENOUS FISTULA Right 08/01/8117   Procedure: PLICATION / REVISION OF ARTERIOVENOUS FISTULA;  Surgeon: Angelia Mould, MD;  Location: [facility];  Service: Vascular;  Laterality: Right;  . REVISON OF ARTERIOVENOUS FISTULA Left 09/01/2015   Procedure: SUPERFICIALIZATION OF LEFT ARM BRACHIOCEPHALIC ARTERIOVENOUS FISTULA;  Surgeon: Angelia Mould, MD;  Location: [facility];  Service: Vascular;  Laterality: Left;  . REVISON  OF ARTERIOVENOUS FISTULA Left 08/09/2016   Procedure: REVISION OF ARTERIAL ANASTOMOSIS OF ARTERIOVENOUS FISTULA;  Surgeon: Waynetta Sandy, MD;  Location: [facility];  Service: Vascular;  Laterality: Left;  . SHOULDER ARTHROSCOPY Left ~ 2006   frozen  . TOE AMPUTATION Bilateral after 2005  . UMBILICAL HERNIA REPAIR  1990's  . VENOGRAM Right 07/28/2011   Procedure: VENOGRAM;  Surgeon: Conrad Napier Field, MD;  Location: [facility] CATH LAB;  Service: Cardiovascular;  Laterality: Right;  . VITRECTOMY Bilateral         Home Medications    Prior to Admission medications   Medication Sig Start Date End Date Taking? Authorizing Provider  allopurinol (ZYLOPRIM) 100 MG tablet Take 2 tablets (200 mg total) by mouth daily. 05/08/18   Bonnita Hollow, MD  AMBULATORY NON FORMULARY MEDICATION Medication Name: Gi cocktail 5-10 mL every 4-6 hours as needed for pain 09/12/17   Levin Erp, [place]  aspirin EC 81 MG tablet Take 81 mg by mouth at bedtime.    [provider]  atorvastatin (LIPITOR) 10 MG tablet Take 1 tablet daily 02/02/18   [provider]  calcium acetate (PHOSLO) 667 MG capsule Take 667-1,334 mg by mouth See admin instructions. Take 1,334 mg by mouth three times a day with meals and 667  mg with each snack 09/19/12   [provider]  cetirizine (ZYRTEC) 10 MG tablet Take 10 mg by mouth daily.    [provider]  diphenhydrAMINE (BENADRYL) 50 MG tablet Take 50 mg by mouth every 6 (six) hours as needed (for itching or allergic reactions).    [provider]  docusate sodium (COLACE) 100 MG capsule Take 200 mg by mouth 2 (two) times daily.     [provider]  fluticasone (FLONASE) 50 MCG/ACT nasal spray Place 2 sprays into both nostrils daily as needed for allergies.     [provider]  gabapentin (NEURONTIN) 300 MG capsule Take 1 capsule (300 mg total) by mouth daily. 07/18/17   Sela Hilding, MD  GLUCAGON EMERGENCY 1 MG injection Inject 1 mg into the muscle daily as needed (low blood sugar).  01/19/12   [provider]  hydrocortisone 1 % ointment Apply 1 application topically 2 (two) times daily. 07/27/17   Guadalupe Dawn, MD  HYDROmorphone (DILAUDID) 2 MG tablet Take 2 mg by mouth 2 (two) times daily.     [provider]  hydrOXYzine (ATARAX/VISTARIL) 10 MG tablet Take 1 tablet (10 mg total) by mouth every 8 (eight) hours as needed for itching or anxiety. 07/27/17   Guadalupe Dawn, MD  insulin lispro (HUMALOG) 100 UNIT/ML injection Per insulin pump    [provider]  levothyroxine (SYNTHROID, LEVOTHROID) 112 MCG tablet Take 112 mcg by mouth every evening. 2 hours after dinner    [provider]  midodrine (PROAMATINE) 10 MG tablet Take 1 tablet (10 mg total) by mouth 3 (three) times daily. Takes 1 tab in mornings of dialysis and takes 2 tabs 30 mins before starting dialysis (m,w,f) Patient taking differently: Take 10 mg by mouth on the mornings of dialysis (Mon/Wed/Fri) and 20 mg thirty minutes prior to starting dialysis (Mon/Wed/Fri) 04/14/16   Elgergawy, Silver Huguenin, MD  multivitamin (RENA-VIT) TABS tablet Take 1 tablet by mouth daily.    [provider]  nitroGLYCERIN (NITROSTAT) 0.4 MG SL  tablet Place 1 tablet (0.4 mg total) under the tongue every 5 (five) minutes as needed for chest pain. 02/15/16   Burnell Blanks,  MD  Nutritional Supplements (FEEDING SUPPLEMENT, NEPRO CARB STEADY,) LIQD Take 237 mLs by mouth daily. Patient taking differently: Take 237 mLs by mouth every Monday, Wednesday, and Friday with hemodialysis.  01/04/16   Florencia Reasons, MD  Polyethyl Glycol-Propyl Glycol (SYSTANE) 0.4-0.3 % SOLN Place 1 drop into both eyes 3 (three) times daily as needed (for dry eyes).     [provider]  polyethylene glycol (MIRALAX / GLYCOLAX) packet Take 17 g by mouth daily. Patient taking differently: Take 17 g by mouth daily as needed for mild constipation.  02/06/16   Regalado, Belkys A, MD  prasugrel (EFFIENT) 10 MG TABS tablet TAKE 1 TABLET BY MOUTH EVERY DAY 03/01/18   Eileen Stanford, PA-C  rOPINIRole (REQUIP) 2 MG tablet Take 1 tablet (2 mg total) by mouth at bedtime. Take 2 mg by mouth every night at bedtime 11/01/17 12/01/17  Bonnita Hollow, MD  testosterone cypionate (DEPOTESTOSTERONE CYPIONATE) 200 MG/ML injection INJECT 200 MG INTO THE MUSCLE EVERY 14 DAYS 09/10/16   [provider]  VELPHORO 500 MG chewable tablet Chew 500-1,000 mg by mouth 3 (three) times daily with meals. Chew 1,000 mg by mouth three times a day with meals and 500 mg with each snack 11/15/16   [provider]    Family History Family History  Problem Relation Age of Onset  . Colon polyps Father   . Diabetes Father   . Hypertension Father   . Hypertension Mother   . Diabetes Mother   . Hyperlipidemia Mother   . Breast cancer Maternal Aunt   . Bone cancer Maternal Aunt   . Liver cancer Maternal Aunt   . Malignant hyperthermia Neg Hx   . Heart attack Neg Hx   . Stroke Neg Hx   . Colon cancer Neg Hx   . Stomach cancer Neg Hx   . Pancreatic cancer Neg Hx   . Esophageal cancer Neg Hx     Social History Social History   Tobacco Use  . Smoking status: Never  Smoker  . Smokeless tobacco: Never Used  Substance Use Topics  . Alcohol use: Yes    Comment: occasional  . Drug use: No     Allergies   Iodinated diagnostic agents; Prednisone; Adhesive [tape]; Amoxicillin; Clindamycin/lincomycin; Enalapril; Mobic [meloxicam]; Morphine; Vancomycin; Brilinta [ticagrelor]; Ciprofloxacin; Clopidogrel; Codeine; Doxycycline; Hydrocodone; Levofloxacin; Oxycodone; and Rocephin [ceftriaxone]   Review of Systems Review of Systems  Constitutional: Negative for fever.  HENT: Negative for sore throat.   Eyes: Negative for visual disturbance.  Respiratory: Negative for shortness of breath.   Cardiovascular: Negative for chest pain.  Gastrointestinal: Negative for abdominal pain.  Genitourinary: Negative for difficulty urinating.  Musculoskeletal: Positive for myalgias. Negative for back pain and neck stiffness.  Skin: Positive for color change. Negative for rash.  Neurological: Negative for syncope and headaches.     Physical Exam Updated Vital Signs BP (!) 133/53   Pulse 96   Temp 98.2 F (36.8 C) (Oral)   Resp 20   SpO2 (!) 89%   Physical Exam Vitals signs and nursing note reviewed.  Constitutional:      General: He is not in acute distress.    Appearance: He is well-developed. He is not diaphoretic.  HENT:     Head: Normocephalic and atraumatic.  Eyes:     Conjunctiva/sclera: Conjunctivae normal.  Neck:     Musculoskeletal: Normal range of motion.  Cardiovascular:     Rate and Rhythm: Normal rate and regular rhythm.  Heart sounds: Normal heart sounds. No murmur. No friction rub. No gallop.   Pulmonary:     Effort: Pulmonary effort is normal. No respiratory distress.     Breath sounds: Normal breath sounds. No wheezing or rales.  Abdominal:     General: There is no distension.     Palpations: Abdomen is soft.     Tenderness: There is no abdominal tenderness. There is no guarding.  Musculoskeletal:     Comments: LUE fistula with  contusion, palpable pulse, no thrill  Skin:    General: Skin is warm and dry.  Neurological:     Mental Status: He is alert and oriented to person, place, and time.      ED Treatments / Results  Labs (all labs ordered are listed, but only abnormal results are displayed) Labs Reviewed  COMPREHENSIVE METABOLIC PANEL - Abnormal; Notable for the following components:      Result Value   Potassium 5.6 (*)    Glucose, Bld 243 (*)    BUN 72 (*)    Creatinine, Ser 12.85 (*)    Albumin 3.3 (*)    GFR calc non Af Amer 4 (*)    GFR calc Af Amer 5 (*)    Anion gap 16 (*)    All other components within normal limits  CBC WITH DIFFERENTIAL/PLATELET - Abnormal; Notable for the following components:   RBC 3.61 (*)    Hemoglobin 11.9 (*)    HCT 38.3 (*)    MCV 106.1 (*)    All other components within normal limits    EKG None  Radiology No results found.  Procedures Procedures (including critical care time)  Medications Ordered in ED Medications  sodium zirconium cyclosilicate (LOKELMA) packet 5 g (has no administration in time range)  insulin glargine (LANTUS) injection 10 Units (has no administration in time range)  insulin aspart (novoLOG) injection 0-9 Units (has no administration in time range)  HYDROmorphone (DILAUDID) tablet 2 mg (2 mg Oral Given 08/02/18 0016)     Initial Impression / Assessment and Plan / ED Course  I have reviewed the triage vital signs and the nursing notes.  Pertinent labs & imaging results that were available during my care of the patient were reviewed by me and considered in my medical decision making (see chart for details).        53yo male presents with concern for fistula prob, suspect thromboed fistula.   Discussed with Dr. Donnetta Hutching of Vascular surgery and team will assess in AM.  K 5.6.  Discussed with nephrology, who recommends admission for monitoring nad procedure in the AM with dialysis tomorrow.. No sign of cellulitis.  Final Clinical  Impressions(s) / ED Diagnoses   Final diagnoses:  Thrombosis of arteriovenous fistula, initial encounter [facility])  Hyperkalemia    ED Discharge Orders    None       Gareth Morgan, MD 08/02/18 [phone]

## 2018-08-02 NOTE — H&P (View-Only) (Signed)
[facility] [place]  Patient name: Dale [name] MRN: [id number] DOB: [birth year] Sex: male    HPI: Dale [name] is a 53 y.o. male with chronic renal failure.  He resented to dialysis on 08/01/2018 and was felt to have a thrombosed access.  He initially was referred to [facility].  He was found to have a history of contrast reaction and was told that this would need to be taken care of at the hospital.  He presented to the emergency room.  I was called approximately 10 PM.  Felt that if he did not have hyperkalemia or reason for admission this could be done on 4 /9//2020.  He was admitted and is seen in consultation.  His left upper arm fistula was initially created by Dr. Scot Dock in January 2017.  He had superficial cessation of this in May 2017.  He has had very good function.  He did undergo a revision of stenosis at the AV anastomosis in April 2018 and had an angioplasty of the AV anastomosis and March 2019.  Sometime in this interim he did have stenting of the cephalic arch into the subclavian vein.  This was found to be widely patent on his fistulogram in March 2019.  Past Medical History:  Diagnosis Date  . Anal fissure   . Anemia of chronic disease   . Arthritis    "hands, right knee" (03/19/2014) no cartlidge in right knee  . CAD (coronary artery disease)    a. Abnl nuc ->LHC (11/15):  mid to dist Dx 80%, mid RCA 99% (functional CTO), dist RCA 80% >> PCI: balloon angioplasty to mid RCA (could not deliver stent) - staged PCI Rotoblator atherectomy/DES to [facility]. b. NSTEMI 12/2015 s/p DES to diagonal.  . Cholecystitis    a. 08/27/2011  . Chronic diastolic CHF (congestive heart failure) ([place])    a. Echo 3/13:  EF 55-60%;  b. Echo (11/15):  Mild LVH, EF 60-65%, no RWMA, Gr 1 DD, MAC, mild LAE, normal RVF, mild RAE;  c.  Echo 5/16:  severe LVH, EF 55-60%, no RWMA, Gr 1 DD, MAC, mild LAE  . Claustrophobia    when things get around  his face.   . Complication of anesthesia   . Difficult intubation    only once in 2015 at [facility] haven't had a problem since then  . Diverticulosis   . Esophageal dysmotility   . ESRD (end stage renal disease) ([place])    a. 1995 s/p cadaveric transplant w/ susbequent failure after 18 yrs;  b.Dialysis initiated 07/2011 [facility] M[facility]  . Fibromyalgia   . Gastroparesis   . GERD (gastroesophageal reflux disease)   . Gout    PMH  . History of blood transfusion   . HLD (hyperlipidemia)   . Hypertension   . Hypothyroidism   . Inguinal lymphadenopathy    a. bilateral - s/p biopsy 07/2011  . Insulin dependent diabetes mellitus (HCC)    Type I  . Monilial esophagitis ([place])   . Morbid obesity ([place])   . OSA (obstructive sleep apnea)    adjustable bed  . PAD (peripheral artery disease) (HCC)    a. s/p L BKA.  [name] Kitchen Pericardial effusion    a.  Small by CT 08/22/11;  b.  Large by CT 08/27/11. c. Not seen on 11/2015 echo.  . Pneumonia ~ 2007  . PONV (postoperative nausea and vomiting)    vomited once after a procedure.  [name] Kitchen  T1DM (type 1 diabetes mellitus) ([place])   . Tracheobronchomalacia     Family History  Problem Relation Age of Onset  . Colon polyps Father   . Diabetes Father   . Hypertension Father   . Hypertension Mother   . Diabetes Mother   . Hyperlipidemia Mother   . Breast cancer Maternal Aunt   . Bone cancer Maternal Aunt   . Liver cancer Maternal Aunt   . Malignant hyperthermia Neg Hx   . Heart attack Neg Hx   . Stroke Neg Hx   . Colon cancer Neg Hx   . Stomach cancer Neg Hx   . Pancreatic cancer Neg Hx   . Esophageal cancer Neg Hx     SOCIAL HISTORY: Social History   Tobacco Use  . Smoking status: Never Smoker  . Smokeless tobacco: Never Used  Substance Use Topics  . Alcohol use: Yes    Comment: occasional    Allergies  Allergen Reactions  . Iodinated Diagnostic Agents Itching, Rash and Other (See Comments)    Systemic itching and transient rash appearance  within hours of receiving contrast    . Prednisone Other (See Comments)    Increases sugar levels  . Adhesive [Tape] Itching and Other (See Comments)    Blisters on arm from adhesive tape at dialysis   . Amoxicillin Itching, Rash and Other (See Comments)    Has patient had a PCN reaction causing immediate rash, facial/tongue/throat swelling, SOB or lightheadedness with hypotension: Yes Has patient had a PCN reaction causing severe rash involving mucus membranes or skin necrosis: No Has patient had a PCN reaction that required hospitalization No Has patient had a PCN reaction occurring within the last 10 years: No If all of the above answers are "NO", then may proceed with Cephalosporin use.  . Clindamycin/Lincomycin Hives and Itching  . Enalapril Rash and Cough    Cough  . Mobic [Meloxicam] Hives and Rash  . Morphine Hives, Nausea Only and Rash  . Vancomycin Hives  . Brilinta [Ticagrelor] Nausea And Vomiting  . Ciprofloxacin Itching and Rash  . Clopidogrel Rash  . Codeine Hives, Itching and Rash  . Doxycycline Rash  . Hydrocodone Rash  . Levofloxacin Hives and Itching  . Oxycodone Rash  . Rocephin [Ceftriaxone] Itching    Current Facility-Administered Medications  Medication Dose Route Frequency Provider Last Rate Last Dose  . acetaminophen (TYLENOL) tablet 650 mg  650 mg Oral Q6H PRN Patriciaann Clan, DO   650 mg at 08/02/18 0354   Or  . acetaminophen (TYLENOL) suppository 650 mg  650 mg Rectal Q6H PRN Patriciaann Clan, DO      . allopurinol (ZYLOPRIM) tablet 200 mg  200 mg Oral Daily Beard, Samantha N, DO      . aspirin EC tablet 81 mg  81 mg Oral QHS Beard, Samantha N, DO      . atorvastatin (LIPITOR) tablet 10 mg  10 mg Oral Daily Beard, Samantha N, DO      . docusate sodium (COLACE) capsule 200 mg  200 mg Oral BID Beard, Samantha N, DO      . gabapentin (NEURONTIN) capsule 100 mg  100 mg Oral QHS Beard, Samantha N, DO      . heparin injection 5,000 Units  5,000 Units  Subcutaneous Q8H Beard, Samantha N, DO      . HYDROmorphone (DILAUDID) tablet 2 mg  2 mg Oral BID PRN Darrelyn Hillock N, DO      . insulin  aspart (novoLOG) injection 0-9 Units  0-9 Units Subcutaneous Q4H Beard, Samantha N, DO      . insulin glargine (LANTUS) injection 10 Units  10 Units Subcutaneous Daily Beard, Samantha N, DO      . levothyroxine (SYNTHROID, LEVOTHROID) tablet 112 mcg  112 mcg Oral QPM Beard, Samantha N, DO      . polyethylene glycol (MIRALAX / GLYCOLAX) packet 17 g  17 g Oral Daily Beard, Samantha N, DO      . prasugrel (EFFIENT) tablet 10 mg  10 mg Oral Daily Beard, Samantha N, DO        REVIEW OF SYSTEMS:  _0  denotes positive finding, _1  denotes negative finding Cardiac  Comments:  Chest pain or chest pressure:    Shortness of breath upon exertion:    Short of breath when lying flat:    Irregular heart rhythm:        Vascular    Pain in calf, thigh, or hip brought on by ambulation:    Pain in feet at night that wakes you up from your sleep:     Blood clot in your veins:    Leg swelling:           PHYSICAL EXAM: Vitals:   08/02/18 0115 08/02/18 0145 08/02/18 0200 08/02/18 0307  BP: (!) 125/53 (!) 118/54 (!) 133/53 (!) 152/65  Pulse: 91 97 96 (!) 39  Resp: 17 (!) _2 Temp:    98.3 F (36.8 C)  TempSrc:    Oral  SpO2: (!) 86% (!) 88% (!) 89% 92%    GENERAL: The patient is a well-nourished male, in no acute distress. The vital signs are documented above. CARDIOVASCULAR: He does have a pulse in his upper arm fistula with a water hammer nature.  He does not have a bruit. PULMONARY: There is good air exchange  MUSCULOSKELETAL: There are no major deformities or cyanosis. NEUROLOGIC: No focal weakness or paresthesias are detected. SKIN: There are no ulcers or rashes noted. PSYCHIATRIC: The patient has a normal affect.  DATA:  None  MEDICAL ISSUES: Discussed options with the patient.  His fistula itself is not thrombosed throughout its course.  He  does either have a critical narrowing or thrombosis proximally by physical exam.  He does have contrast reaction and requires pretreatment.  He reports that despite prednisone and Benadryl, he still has typically peeling of his skin and rash but this is tolerable.  We will proceed with this later today.  I explained that the operating schedule is very full and this will be later in the afternoon.  So explained that he may require placement of a tunneled catheter if we are unsuccessful and revision of his left arm AV fistula    Rosetta Posner, MD [facility] Tel [phone] Pager [phone]

## 2018-08-02 NOTE — Telephone Encounter (Signed)
LATE ENTRY. 08/01/2018 15:45.  I called to speak with ADAM BOLAND RN at [facility] after receiving a fax for "high venous pressure requesting intervention". Nurse stated they were unable to complete dialysis and no bruit. Possible de-clot. Patient was sent home after completing only 40 min of treatment. No other access available. None of this information was on the fax. I discussed with him that in the future in such a case speak with the triage nurse instead of a fax to get help with intervention right away.  First line for help is Dr. Augustin Coupe. He stated that the patient refused to go to Dr. Augustin Coupe. Second was to contact [employer] and Phone number given. If they are unable to help then he is to call the [facility] doctor on call who is Dr. Donnetta Hutching and number given.

## 2018-08-02 NOTE — Anesthesia Preprocedure Evaluation (Addendum)
Anesthesia Evaluation  Patient identified by MRN, date of birth, ID band Patient awake    Reviewed: Allergy & Precautions, H&P , NPO status , Patient's Chart, lab work & pertinent test results  History of Anesthesia Complications (+) PONV and DIFFICULT AIRWAY  Airway Mallampati: III  TM Distance: >3 FB Neck ROM: Full    Dental no notable dental hx. (+) Teeth Intact, Dental Advisory Given   Pulmonary sleep apnea ,    Pulmonary exam normal breath sounds clear to auscultation       Cardiovascular hypertension, + CAD, + Past MI, + Cardiac Stents, + Peripheral Vascular Disease, +CHF and + DOE   Rhythm:Regular Rate:Normal     Neuro/Psych Anxiety negative neurological ROS     GI/Hepatic Neg liver ROS, GERD  ,  Endo/Other  diabetesHypothyroidism Morbid obesity  Renal/GU ESRF and DialysisRenal disease  negative genitourinary   Musculoskeletal  (+) Arthritis , Osteoarthritis,  Fibromyalgia -  Abdominal   Peds  Hematology  (+) Blood dyscrasia, anemia ,   Anesthesia Other Findings   Reproductive/Obstetrics negative OB ROS                           Anesthesia Physical Anesthesia Plan  ASA: III  Anesthesia Plan: General   Post-op Pain Management:    Induction: Intravenous  PONV Risk Score and Plan: 4 or greater and Ondansetron, Dexamethasone and Midazolam  Airway Management Planned: Oral ETT and Video Laryngoscope Planned  Additional Equipment:   Intra-op Plan:   Post-operative Plan: Extubation in OR  Informed Consent: I have reviewed the patients History and Physical, chart, labs and discussed the procedure including the risks, benefits and alternatives for the proposed anesthesia with the patient or authorized representative who has indicated his/her understanding and acceptance.     Dental advisory given  Plan Discussed with: CRNA  Anesthesia Plan Comments:          Anesthesia Quick Evaluation

## 2018-08-02 NOTE — ED Notes (Signed)
Pt sleeping. 

## 2018-08-02 NOTE — Progress Notes (Signed)
Inpatient Diabetes Program Recommendations  AACE/ADA: New Consensus Statement on Inpatient Glycemic Control (2015)  Target Ranges:  Prepandial:   less than 140 mg/dL      Peak postprandial:   less than 180 mg/dL (1-2 hours)      Critically ill patients:  140 - 180 mg/dL   Lab Results  Component Value Date   GLUCAP 271 (H) 08/02/2018   HGBA1C 5.7 (H) 02/25/2016    Review of Glycemic Control  Diabetes history: DM Outpatient Diabetes medications: Omnipod insulin pump Current orders for Inpatient glycemic control: restarted Omnipod insulin pump  Inpatient Diabetes Program Recommendations:    DM coordinator working remotely. Spoke with patient by phone. Wife brought supplies to restart insulin pump. Discussed by phone with on call physician and insulin pump restarted and will review CBGs. Patient received D50 due to elevated potassium. Spoke with RN Verline Lema and she printed flowsheet to keep @ bedside and patient to list amounts of correction and meal coverage and contract to be signed by patient and placed in shadow chart. . Patient had endocrinology visit on 05/08/18 with Dr. Starleen Blue Current Pump settings: Basal rate: -12 MN to 8 am = 1.1 units/hr -8 am -4 pm = 1.6 units/hr -4 pm-12 MN= 1.35 units/hr.  Thank you, Nani Gasser. Jaythan Hinely, RN, MSN, CDE  Diabetes Coordinator Inpatient Glycemic Control Team Team Pager [phone] (8am-5pm) 08/02/2018 12:40 PM

## 2018-08-02 NOTE — ED Notes (Signed)
Report given to rn on 5 m

## 2018-08-02 NOTE — Interval H&P Note (Signed)
History and Physical Interval Note:  08/02/2018 4:49 PM  Dale [name]  has presented today for surgery, with the diagnosis of thrombosis left arm av fistula.  The various methods of treatment have been discussed with the patient and family. After consideration of risks, benefits and other options for treatment, the patient has consented to  Procedure(s): THROMBECTOMY ARTERIOVENOUS FISTULA possible revision (Left) as a surgical intervention.  [name] history has been reviewed, patient examined, no change in status, stable for surgery.  I have reviewed the patient's chart and labs.  Questions were answered to the patient's satisfaction.     [name]

## 2018-08-02 NOTE — Anesthesia Postprocedure Evaluation (Signed)
Anesthesia Post Note  Patient: Salam Micucci  Procedure(s) Performed: THROMBECTOMY ARTERIOVENOUS FISTULA (Left Arm Upper) Insertion Of Dialysis Catheter (Right Chest)     Patient location during evaluation: PACU Anesthesia Type: General Level of consciousness: awake and alert Pain management: pain level controlled Vital Signs Assessment: post-procedure vital signs reviewed and stable Respiratory status: spontaneous breathing, nonlabored ventilation, respiratory function stable and patient connected to nasal cannula oxygen Cardiovascular status: blood pressure returned to baseline and stable Postop Assessment: no apparent nausea or vomiting Anesthetic complications: no    Last Vitals:  Vitals:   08/02/18 2040 08/02/18 2107  BP: 137/66 (!) 157/74  Pulse: 87 91  Resp: (!) 21 20  Temp: 36.6 C 36.6 C  SpO2: 100% (!) 88%    Last Pain:  Vitals:   08/02/18 2107  TempSrc: Oral  PainSc:                  [place]

## 2018-08-02 NOTE — Consult Note (Addendum)
[facility] Renal Consultation Note    Indication for Consultation:  Management of ESRD/hemodialysis; anemia, hypertension/volume and secondary hyperparathyroidism PCP: Dr. Josephine Igo  HPI: Dale [name] is a 53 y.o. male with ESRD on hemodialysis MWF at [facility]. PMH: ESRD due to DM nephropathy, originally started HD Sept 1991, then s/p Tx Jan 1995-2013, resumed HD 2013.Type 1 DM (on insulin pump), Hx L BKA, CAD, s/p cardiac stent 12/2015, HFpEF, hypothyroidism, fibromyalgia, Hx appendectomy, OSA, AOCD, SHPT. He is compliant with hemodialysis prescription, traditionally has high IDWG.   Patient presented to ED last PM with clotted LUA AVF. He has H/O contrast allergies. He missed HD 08/01/2018. K+ on arrival to ED was 5.6 but has now climbed to 6.1. He has been seen by VVS today and plans are in place for declot/fistulagram, may require TDC. He has been doing well in normal state of health, no other issues except clotted AVF. Sitting up at bedside, denies chest pain, SOB, fever, chills, N, V,D, Abdominal or flank pain. Pleasant, NAD.    Past Medical History:  Diagnosis Date  . Anal fissure   . Anemia of chronic disease   . Arthritis    "hands, right knee" (03/19/2014) no cartlidge in right knee  . CAD (coronary artery disease)    a. Abnl nuc ->LHC (11/15):  mid to dist Dx 80%, mid RCA 99% (functional CTO), dist RCA 80% >> PCI: balloon angioplasty to mid RCA (could not deliver stent) - staged PCI Rotoblator atherectomy/DES to [facility]. b. NSTEMI 12/2015 s/p DES to diagonal.  . Cholecystitis    a. 08/27/2011  . Chronic diastolic CHF (congestive heart failure) ([place])    a. Echo 3/13:  EF 55-60%;  b. Echo (11/15):  Mild LVH, EF 60-65%, no RWMA, Gr 1 DD, MAC, mild LAE, normal RVF, mild RAE;  c.  Echo 5/16:  severe LVH, EF 55-60%, no RWMA, Gr 1 DD, MAC, mild LAE  . Claustrophobia    when things get around his face.   . Complication of anesthesia   . Difficult intubation     only once in 2015 at [facility] haven't had a problem since then  . Diverticulosis   . Esophageal dysmotility   . ESRD (end stage renal disease) ([place])    a. 1995 s/p cadaveric transplant w/ susbequent failure after 18 yrs;  b.Dialysis initiated 07/2011 [facility] M-W-F  . Fibromyalgia   . Gastroparesis   . GERD (gastroesophageal reflux disease)   . Gout    PMH  . History of blood transfusion   . HLD (hyperlipidemia)   . Hypertension   . Hypothyroidism   . Inguinal lymphadenopathy    a. bilateral - s/p biopsy 07/2011  . Insulin dependent diabetes mellitus (HCC)    Type I  . Monilial esophagitis ([place])   . Morbid obesity ([place])   . OSA (obstructive sleep apnea)    adjustable bed  . PAD (peripheral artery disease) (HCC)    a. s/p L BKA.  [name] Kitchen Pericardial effusion    a.  Small by CT 08/22/11;  b.  Large by CT 08/27/11. c. Not seen on 11/2015 echo.  . Pneumonia ~ 2007  . PONV (postoperative nausea and vomiting)    vomited once after a procedure.  . T1DM (type 1 diabetes mellitus) ([place])   . Tracheobronchomalacia    Past Surgical History:  Procedure Laterality Date  . A/V FISTULAGRAM Left 07/26/2016   Procedure: A/V Fistulagram;  Surgeon: Waynetta Sandy,  MD;  Location: [place] CV LAB;  Service: Cardiovascular;  Laterality: Left;  . A/V FISTULAGRAM N/A 07/11/2017   Procedure: A/V FISTULAGRAM - Left AV;  Surgeon: Waynetta Sandy, MD;  Location: [place] CV LAB;  Service: Cardiovascular;  Laterality: N/A;  . AMPUTATION OF REPLICATED TOES Right   . ANGIOPLASTY  04/09/2014   RCA   . APPENDECTOMY  ~ 2006  . ARTERIOVENOUS GRAFT PLACEMENT Left 1993?   forearm  . ARTERIOVENOUS GRAFT PLACEMENT Right 1993?   leg  . ARTERIOVENOUS GRAFT PLACEMENT Left    Thigh  . Arteriovenous Graft Removed Left    Thigh  . AV FISTULA PLACEMENT  07/29/2011   Procedure: ARTERIOVENOUS (AV) FISTULA CREATION;  Surgeon: Mal Misty, MD;  Location: [place];  Service: Vascular;  Laterality:  Right;  Brachial cephalic  . AV FISTULA PLACEMENT Left 01/20/2015   Procedure: LIGATION OF RIGHT ARTERIOVENOUS FISTULA WITH EXCISION OF ANEURYSM;  Surgeon: Angelia Mould, MD;  Location: [place];  Service: Vascular;  Laterality: Left;  . AV FISTULA PLACEMENT Left 04/30/2015   Procedure: CREATION OF LEFT ARM BRACHIO-CEPHALIC ARTERIOVENOUS (AV) FISTULA ;  Surgeon: Angelia Mould, MD;  Location: [place];  Service: Vascular;  Laterality: Left;  . AV Graft PLACEMENT Left 1993?   "attempted one in my wrist; didn't take"  . BELOW KNEE LEG AMPUTATION Left 2010  . CARDIAC CATHETERIZATION  03/19/2014  . CARDIAC CATHETERIZATION  03/19/2014   Procedure: CORONARY BALLOON ANGIOPLASTY;  Surgeon: Burnell Blanks, MD;  Location: [facility] CATH LAB;  Service: Cardiovascular;;  . CARDIAC CATHETERIZATION N/A 01/01/2016   Procedure: Left Heart Cath and Coronary Angiography;  Surgeon: Troy Sine, MD;  Location: [place] CV LAB;  Service: Cardiovascular;  Laterality: N/A;  . CARDIAC CATHETERIZATION N/A 01/01/2016   Procedure: Coronary Stent Intervention;  Surgeon: Troy Sine, MD;  Location: [place] CV LAB;  Service: Cardiovascular;  Laterality: N/A;  . CARPAL TUNNEL RELEASE Bilateral after 2005  . CATARACT EXTRACTION W/ INTRAOCULAR LENS  IMPLANT, BILATERAL Bilateral 1990's  . COLONOSCOPY  08/29/2011   Procedure: COLONOSCOPY;  Surgeon: Jerene Bears, MD;  Location: [place];  Service: Gastroenterology;  Laterality: N/A;  . COLONOSCOPY WITH PROPOFOL N/A 04/26/2016   Procedure: COLONOSCOPY WITH PROPOFOL;  Surgeon: Jerene Bears, MD;  Location: [facility] ENDOSCOPY;  Service: Gastroenterology;  Laterality: N/A;  . CORONARY ANGIOPLASTY WITH STENT PLACEMENT  04/09/2014       ptca/des mid lad   . ESOPHAGOGASTRODUODENOSCOPY N/A 12/25/2015   Procedure: ESOPHAGOGASTRODUODENOSCOPY (EGD);  Surgeon: Mauri Pole, MD;  Location: [facility] ENDOSCOPY;  Service: Endoscopy;  Laterality: N/A;  bedside  .  ESOPHAGOGASTRODUODENOSCOPY (EGD) WITH PROPOFOL N/A 04/26/2016   Procedure: ESOPHAGOGASTRODUODENOSCOPY (EGD) WITH PROPOFOL;  Surgeon: Jerene Bears, MD;  Location: [facility] ENDOSCOPY;  Service: Gastroenterology;  Laterality: N/A;  . EYE SURGERY Bilateral    cataract  . FEMORAL ARTERY REPAIR  04/09/2014   ANGIOSEAL  . INSERTION OF DIALYSIS CATHETER  08/01/2011   Procedure: INSERTION OF DIALYSIS CATHETER;  Surgeon: Rosetta Posner, MD;  Location: [place];  Service: Vascular;  Laterality: Right;  insertion of dialysis catheter on right internal jugular vein  . INSERTION OF DIALYSIS CATHETER Right 01/20/2015   Procedure: INSERTION OF DIALYSIS CATHETER - RIGHT INTERNAL JUGULAR ;  Surgeon: Angelia Mould, MD;  Location: [place];  Service: Vascular;  Laterality: Right;  . KIDNEY TRANSPLANT  04/25/1993  . LEFT HEART CATHETERIZATION WITH CORONARY ANGIOGRAM N/A 03/19/2014   Procedure: LEFT  HEART CATHETERIZATION WITH CORONARY ANGIOGRAM;  Surgeon: Burnell Blanks, MD;  Location: [facility] CATH LAB;  Service: Cardiovascular;  Laterality: N/A;  . LYMPH NODE BIOPSY  08/10/2011   Procedure: LYMPH NODE BIOPSY;  Surgeon: Merrie Roof, MD;  Location: [place];  Service: General;  Laterality: Left;  left groin lymph biopsy  . NEUROPLASTY / TRANSPOSITION ULNAR NERVE AT ELBOW Left after 2005  . PATCH ANGIOPLASTY Left 08/09/2016   Procedure: LEFT  ARTERIOVENOUS FISTULA PATCH ANGIOPLASTY;  Surgeon: Waynetta Sandy, MD;  Location: [place];  Service: Vascular;  Laterality: Left;  . PERCUTANEOUS CORONARY ROTOBLATOR INTERVENTION (PCI-R) N/A 04/09/2014   Procedure: PERCUTANEOUS CORONARY ROTOBLATOR INTERVENTION (PCI-R);  Surgeon: Burnell Blanks, MD;  Location: [facility] CATH LAB;  Service: Cardiovascular;  Laterality: N/A;  . PERIPHERAL VASCULAR BALLOON ANGIOPLASTY  07/11/2017   Procedure: PERIPHERAL VASCULAR BALLOON ANGIOPLASTY;  Surgeon: Waynetta Sandy, MD;  Location: [place] CV LAB;  Service: Cardiovascular;;  left  arm AV fistula  . PERIPHERAL VASCULAR CATHETERIZATION N/A 12/25/2014   Procedure: Upper Extremity Venography;  Surgeon: Conrad Cutler, MD;  Location: [place] CV LAB;  Service: Cardiovascular;  Laterality: N/A;  . PERIPHERAL VASCULAR CATHETERIZATION Left 02/24/2016   Procedure: Fistulagram;  Surgeon: Waynetta Sandy, MD;  Location: [place] CV LAB;  Service: Cardiovascular;  Laterality: Left;  . PERIPHERAL VASCULAR CATHETERIZATION Left 02/24/2016   Procedure: Peripheral Vascular Intervention;  Surgeon: Waynetta Sandy, MD;  Location: [place] CV LAB;  Service: Cardiovascular;  Laterality: Left;  AV FISTULA  . PERITONEAL CATHETER INSERTION    . PERITONEAL CATHETER REMOVAL    . REVISON OF ARTERIOVENOUS FISTULA Right 7/67/3419   Procedure: PLICATION / REVISION OF ARTERIOVENOUS FISTULA;  Surgeon: Angelia Mould, MD;  Location: [place];  Service: Vascular;  Laterality: Right;  . REVISON OF ARTERIOVENOUS FISTULA Left 09/01/2015   Procedure: SUPERFICIALIZATION OF LEFT ARM BRACHIOCEPHALIC ARTERIOVENOUS FISTULA;  Surgeon: Angelia Mould, MD;  Location: [place];  Service: Vascular;  Laterality: Left;  . REVISON OF ARTERIOVENOUS FISTULA Left 08/09/2016   Procedure: REVISION OF ARTERIAL ANASTOMOSIS OF ARTERIOVENOUS FISTULA;  Surgeon: Waynetta Sandy, MD;  Location: [place];  Service: Vascular;  Laterality: Left;  . SHOULDER ARTHROSCOPY Left ~ 2006   frozen  . TOE AMPUTATION Bilateral after 2005  . UMBILICAL HERNIA REPAIR  1990's  . VENOGRAM Right 07/28/2011   Procedure: VENOGRAM;  Surgeon: Conrad Rock House, MD;  Location: [facility] CATH LAB;  Service: Cardiovascular;  Laterality: Right;  . VITRECTOMY Bilateral    Family History  Problem Relation Age of Onset  . Colon polyps Father   . Diabetes Father   . Hypertension Father   . Hypertension Mother   . Diabetes Mother   . Hyperlipidemia Mother   . Breast cancer Maternal Aunt   . Bone cancer Maternal Aunt   . Liver cancer  Maternal Aunt   . Malignant hyperthermia Neg Hx   . Heart attack Neg Hx   . Stroke Neg Hx   . Colon cancer Neg Hx   . Stomach cancer Neg Hx   . Pancreatic cancer Neg Hx   . Esophageal cancer Neg Hx    Social History:  reports that he has never smoked. He has never used smokeless tobacco. He reports current alcohol use. He reports that he does not use drugs. Allergies  Allergen Reactions  . Iodinated Diagnostic Agents Itching, Rash and Other (See Comments)    Systemic itching and transient rash appearance within  hours of receiving contrast    . Prednisone Other (See Comments)    Increases sugar levels  . Adhesive [Tape] Itching and Other (See Comments)    Blisters on arm from adhesive tape at dialysis   . Amoxicillin Itching, Rash and Other (See Comments)    Has patient had a PCN reaction causing immediate rash, facial/tongue/throat swelling, SOB or lightheadedness with hypotension: Yes Has patient had a PCN reaction causing severe rash involving mucus membranes or skin necrosis: No Has patient had a PCN reaction that required hospitalization No Has patient had a PCN reaction occurring within the last 10 years: No If all of the above answers are "NO", then may proceed with Cephalosporin use.  . Clindamycin/Lincomycin Hives and Itching  . Enalapril Rash and Cough    Cough  . Mobic [Meloxicam] Hives and Rash  . Morphine Hives, Nausea Only and Rash  . Vancomycin Hives  . Brilinta [Ticagrelor] Nausea And Vomiting  . Ciprofloxacin Itching and Rash  . Clopidogrel Rash  . Codeine Hives, Itching and Rash  . Doxycycline Rash  . Hydrocodone Rash  . Levofloxacin Hives and Itching  . Oxycodone Rash  . Rocephin [Ceftriaxone] Itching   Prior to Admission medications   Medication Sig Start Date End Date Taking? Authorizing Provider  allopurinol (ZYLOPRIM) 100 MG tablet Take 2 tablets (200 mg total) by mouth daily. 05/08/18   Bonnita Hollow, MD  AMBULATORY NON FORMULARY MEDICATION  Medication Name: Gi cocktail 5-10 mL every 4-6 hours as needed for pain 09/12/17   Levin Erp, [place]  aspirin EC 81 MG tablet Take 81 mg by mouth at bedtime.    [provider]  atorvastatin (LIPITOR) 10 MG tablet Take 1 tablet daily 02/02/18   [provider]  calcium acetate (PHOSLO) 667 MG capsule Take 667-1,334 mg by mouth See admin instructions. Take 1,334 mg by mouth three times a day with meals and 667 mg with each snack 09/19/12   [provider]  cetirizine (ZYRTEC) 10 MG tablet Take 10 mg by mouth daily.    [provider]  diphenhydrAMINE (BENADRYL) 50 MG tablet Take 50 mg by mouth every 6 (six) hours as needed (for itching or allergic reactions).    [provider]  docusate sodium (COLACE) 100 MG capsule Take 200 mg by mouth 2 (two) times daily.     [provider]  fluticasone (FLONASE) 50 MCG/ACT nasal spray Place 2 sprays into both nostrils daily as needed for allergies.     [provider]  gabapentin (NEURONTIN) 300 MG capsule Take 1 capsule (300 mg total) by mouth daily. 07/18/17   Sela Hilding, MD  GLUCAGON EMERGENCY 1 MG injection Inject 1 mg into the muscle daily as needed (low blood sugar).  01/19/12   [provider]  hydrocortisone 1 % ointment Apply 1 application topically 2 (two) times daily. 07/27/17   Guadalupe Dawn, MD  HYDROmorphone (DILAUDID) 2 MG tablet Take 2 mg by mouth 2 (two) times daily.     [provider]  hydrOXYzine (ATARAX/VISTARIL) 10 MG tablet Take 1 tablet (10 mg total) by mouth every 8 (eight) hours as needed for itching or anxiety. 07/27/17   Guadalupe Dawn, MD  insulin lispro (HUMALOG) 100 UNIT/ML injection Per insulin pump    [provider]  levothyroxine (SYNTHROID, LEVOTHROID) 112 MCG tablet Take 112 mcg by mouth every evening. 2 hours after dinner    [provider]  midodrine (PROAMATINE) 10 MG tablet Take 1  tablet (10 mg total) by mouth 3  (three) times daily. Takes 1 tab in mornings of dialysis and takes 2 tabs 30 mins before starting dialysis (m,w,f) Patient taking differently: Take 10 mg by mouth on the mornings of dialysis (Mon/Wed/Fri) and 20 mg thirty minutes prior to starting dialysis (Mon/Wed/Fri) 04/14/16   Elgergawy, Silver Huguenin, MD  multivitamin (RENA-VIT) TABS tablet Take 1 tablet by mouth daily.    [provider]  nitroGLYCERIN (NITROSTAT) 0.4 MG SL tablet Place 1 tablet (0.4 mg total) under the tongue every 5 (five) minutes as needed for chest pain. 02/15/16   Burnell Blanks, MD  Nutritional Supplements (FEEDING SUPPLEMENT, NEPRO CARB STEADY,) LIQD Take 237 mLs by mouth daily. Patient taking differently: Take 237 mLs by mouth every Monday, Wednesday, and Friday with hemodialysis.  01/04/16   Florencia Reasons, MD  Polyethyl Glycol-Propyl Glycol (SYSTANE) 0.4-0.3 % SOLN Place 1 drop into both eyes 3 (three) times daily as needed (for dry eyes).     [provider]  polyethylene glycol (MIRALAX / GLYCOLAX) packet Take 17 g by mouth daily. Patient taking differently: Take 17 g by mouth daily as needed for mild constipation.  02/06/16   Regalado, Belkys A, MD  prasugrel (EFFIENT) 10 MG TABS tablet TAKE 1 TABLET BY MOUTH EVERY DAY 03/01/18   Eileen Stanford, PA-C  rOPINIRole (REQUIP) 2 MG tablet Take 1 tablet (2 mg total) by mouth at bedtime. Take 2 mg by mouth every night at bedtime 11/01/17 12/01/17  Bonnita Hollow, MD  testosterone cypionate (DEPOTESTOSTERONE CYPIONATE) 200 MG/ML injection INJECT 200 MG INTO THE MUSCLE EVERY 14 DAYS 09/10/16   [provider]  VELPHORO 500 MG chewable tablet Chew 500-1,000 mg by mouth 3 (three) times daily with meals. Chew 1,000 mg by mouth three times a day with meals and 500 mg with each snack 11/15/16   [provider]   Current Facility-Administered Medications  Medication Dose Route Frequency Provider Last Rate Last Dose  . acetaminophen (TYLENOL)  tablet 650 mg  650 mg Oral Q6H PRN Darrelyn Hillock N, DO   650 mg at 08/02/18 0354   Or  . acetaminophen (TYLENOL) suppository 650 mg  650 mg Rectal Q6H PRN Patriciaann Clan, DO      . allopurinol (ZYLOPRIM) tablet 200 mg  200 mg Oral Daily Beard, Samantha N, DO   200 mg at 08/02/18 1114  . aspirin EC tablet 81 mg  81 mg Oral QHS Beard, Samantha N, DO      . atorvastatin (LIPITOR) tablet 10 mg  10 mg Oral Daily Beard, Samantha N, DO   10 mg at 08/02/18 1115  . calcium gluconate 1 g/ 50 mL sodium chloride IVPB  1 g Intravenous Once Valentina Gu, NP 50 mL/hr at 08/02/18 1126 1,000 mg at 08/02/18 1126  . Chlorhexidine Gluconate Cloth 2 % PADS 6 each  6 each Topical Q0600 Dickie La, MD      . docusate sodium (COLACE) capsule 200 mg  200 mg Oral BID Higinio Plan, Samantha N, DO   200 mg at 08/02/18 1115  . doxercalciferol (HECTOROL) injection 5 mcg  5 mcg Intravenous Q M,W,F-HD Valentina Gu, NP      . gabapentin (NEURONTIN) capsule 100 mg  100 mg Oral QHS Beard, Samantha N, DO      . heparin injection 5,000 Units  5,000 Units Subcutaneous Q8H Beard, Samantha N, DO      . HYDROmorphone (DILAUDID) tablet 2 mg  2 mg Oral BID PRN Darrelyn Hillock N, DO   2 mg at 08/02/18 0855  . insulin pump   Subcutaneous TID AC, HS, 0200 Anderson, Chelsey L, DO      . levothyroxine (SYNTHROID, LEVOTHROID) tablet 112 mcg  112 mcg Oral QPM Beard, Samantha N, DO      . mupirocin ointment (BACTROBAN) 2 % 1 application  1 application Nasal BID Dickie La, MD   1 application at 19/50/93 1115  . polyethylene glycol (MIRALAX / GLYCOLAX) packet 17 g  17 g Oral Daily Higinio Plan, Samantha N, DO      . prasugrel (EFFIENT) tablet 10 mg  10 mg Oral Daily Higinio Plan, Samantha N, DO   10 mg at 08/02/18 1115   Labs: Basic Metabolic Panel: Recent Labs  Lab 07/29/18 0102 08/01/18 2203 08/02/18 0542  NA 128* 139 137  K 4.7 5.6* 6.1*  CL 90* 99 98  CO2 21* 24 23  GLUCOSE 311* 243* 253*  BUN 59* 72* 79*  CREATININE 10.48*  12.85* 13.83*  CALCIUM 9.3 9.1 8.8*  PHOS  --   --  7.5*   Liver Function Tests: Recent Labs  Lab 07/29/18 0102 08/01/18 2203 08/02/18 0542  AST 17 16  --   ALT 19 15  --   ALKPHOS 109 101  --   BILITOT 0.5 0.4  --   PROT 8.1 7.3  --   ALBUMIN 3.6 3.3* 3.1*   No results for input(s): LIPASE, AMYLASE in the last 168 hours. No results for input(s): AMMONIA in the last 168 hours. CBC: Recent Labs  Lab 07/29/18 0102 08/01/18 2203 08/02/18 0542  WBC 8.5 7.2 7.5  NEUTROABS 5.5 4.7  --   HGB 13.3 11.9* 11.3*  HCT 42.6 38.3* 36.7*  MCV 106.2* 106.1* 106.1*  PLT 270 251 232   Cardiac Enzymes: Recent Labs  Lab 07/29/18 0102  TROPONINI <0.03   CBG: Recent Labs  Lab 08/02/18 0306 08/02/18 0721 08/02/18 0956 08/02/18 1133  GLUCAP 183* 258* 321* 271*   Iron Studies: No results for input(s): IRON, TIBC, TRANSFERRIN, FERRITIN in the last 72 hours. Studies/Results: No results found.  ROS: As per HPI otherwise negative.  Endocrine No DM.  No Thyroid disease.  No Adrenal disease.  Physical Exam: Vitals:   08/02/18 0200 08/02/18 0307 08/02/18 0827 08/02/18 0930  BP: (!) 133/53 (!) 152/65  (!) 147/72  Pulse: 96 (!) 39  84  Resp: _0 Temp:  98.3 F (36.8 C)  98.1 F (36.7 C)  TempSrc:  Oral  Oral  SpO2: (!) 89% 92%  91%  Weight:   135.5 kg      General: Well developed, well nourished, in no acute distress. Head: Normocephalic, atraumatic, sclera non-icteric, mucus membranes are moist Neck: Supple. JVD not elevated. Lungs: Clear bilaterally to auscultation without wheezes, rales, or rhonchi. Breathing is unlabored. Heart: RRR with S1 S2. No murmurs, rubs, or gallops appreciated. Abdomen: Soft, non-tender, non-distended with normoactive bowel sounds. No rebound/guarding. No obvious abdominal masses. M-S:  Strength and tone appear normal for age. Lower extremities: L BKA. RLE with 1+ pitting edema.  Neuro: Alert and oriented X 3. Moves all extremities  spontaneously. Psych:  Responds to questions appropriately with a normal affect. Dialysis Access: L AVF no bruit.   Dialysis Orders: [facility] MWF 4 hr 15 min 180NRe 450/800 127.4 kg 2.0 K/ 2.25 Ca UFP 4 Linear Sodium -Heparin 5000 units IV TIW -Hectorol 5 mcg IV TIW  Assessment/Plan: 1.  Clotted AV access: Seen by Dr. Donnetta Hutching this AM. Has H/O contrast dye allergy. Will have fistulagram/declot-possible TDC placement later this PM. HD immediately following procedure.  2. Hyperglycemia/DMT1-NPO at present. S/S coverage. Normal AG. Insulin pump on hold.  3. Hyperkalemia-uncertain when he will get HD. Given Lokelmia 10 G, insulin 10 unit IV X 1 dose, 1 amp D50W, 1 amp Calcium gluconate IV. Recheck BS 1 hour post insulin-321. Rechecking RFP at 1300. Will have HD for hyperkalemia when access in place.  4.  ESRD - MWF-missed HD 08/01/18. K+ as noted above. Usual heparin. HD again tomorrow to get back on schedule. 5.  Hypertension/volume  - Pt is currently 8.1 kg above OP EDW. Will have serial HD for volume removal. Attempt 5 liters today.  6.  Anemia  - HGB 11.3 No ESA needed.  7.  Metabolic bone disease -  Ca 8.8 C Ca 9.5 Phos 7.5. On calcium acetate and velphoro binders. Continue binders when eating, VDRA.  8.  Nutrition - Albumin 3.1. NPO at present. Renal/Carb mod diet when eating, renal vit,  9. H/O CAD 10.  Severe OSA-not on CPAP at home. Per primary 11.  Chronic back pain-per primary.   [place] Constantin H. Owens Shark, NP-C 08/02/2018, 12:25 PM  [employer] [phone]

## 2018-08-02 NOTE — Consult Note (Signed)
[facility] [place]  Patient name: Dale [name] MRN: [id number] DOB: [birth year] Sex: male    HPI: Dale [name] is a 53 y.o. male with chronic renal failure.  He resented to dialysis on 08/01/2018 and was felt to have a thrombosed access.  He initially was referred to [facility].  He was found to have a history of contrast reaction and was told that this would need to be taken care of at the hospital.  He presented to the emergency room.  I was called approximately 10 PM.  Felt that if he did not have hyperkalemia or reason for admission this could be done on 4 /9//2020.  He was admitted and is seen in consultation.  His left upper arm fistula was initially created by Dr. Scot Dock in January 2017.  He had superficial cessation of this in May 2017.  He has had very good function.  He did undergo a revision of stenosis at the AV anastomosis in April 2018 and had an angioplasty of the AV anastomosis and March 2019.  Sometime in this interim he did have stenting of the cephalic arch into the subclavian vein.  This was found to be widely patent on his fistulogram in March 2019.  Past Medical History:  Diagnosis Date  . Anal fissure   . Anemia of chronic disease   . Arthritis    "hands, right knee" (03/19/2014) no cartlidge in right knee  . CAD (coronary artery disease)    a. Abnl nuc ->LHC (11/15):  mid to dist Dx 80%, mid RCA 99% (functional CTO), dist RCA 80% >> PCI: balloon angioplasty to mid RCA (could not deliver stent) - staged PCI Rotoblator atherectomy/DES to [facility]. b. NSTEMI 12/2015 s/p DES to diagonal.  . Cholecystitis    a. 08/27/2011  . Chronic diastolic CHF (congestive heart failure) ([place])    a. Echo 3/13:  EF 55-60%;  b. Echo (11/15):  Mild LVH, EF 60-65%, no RWMA, Gr 1 DD, MAC, mild LAE, normal RVF, mild RAE;  c.  Echo 5/16:  severe LVH, EF 55-60%, no RWMA, Gr 1 DD, MAC, mild LAE  . Claustrophobia    when things get around  his face.   . Complication of anesthesia   . Difficult intubation    only once in 2015 at [facility] haven't had a problem since then  . Diverticulosis   . Esophageal dysmotility   . ESRD (end stage renal disease) ([place])    a. 1995 s/p cadaveric transplant w/ susbequent failure after 18 yrs;  b.Dialysis initiated 07/2011 [facility] M[facility]  . Fibromyalgia   . Gastroparesis   . GERD (gastroesophageal reflux disease)   . Gout    PMH  . History of blood transfusion   . HLD (hyperlipidemia)   . Hypertension   . Hypothyroidism   . Inguinal lymphadenopathy    a. bilateral - s/p biopsy 07/2011  . Insulin dependent diabetes mellitus (HCC)    Type I  . Monilial esophagitis ([place])   . Morbid obesity ([place])   . OSA (obstructive sleep apnea)    adjustable bed  . PAD (peripheral artery disease) (HCC)    a. s/p L BKA.  [name] Kitchen Pericardial effusion    a.  Small by CT 08/22/11;  b.  Large by CT 08/27/11. c. Not seen on 11/2015 echo.  . Pneumonia ~ 2007  . PONV (postoperative nausea and vomiting)    vomited once after a procedure.  [name] Kitchen  T1DM (type 1 diabetes mellitus) (HCC)   . Tracheobronchomalacia     Family History  Problem Relation Age of Onset  . Colon polyps Father   . Diabetes Father   . Hypertension Father   . Hypertension Mother   . Diabetes Mother   . Hyperlipidemia Mother   . Breast cancer Maternal Aunt   . Bone cancer Maternal Aunt   . Liver cancer Maternal Aunt   . Malignant hyperthermia Neg Hx   . Heart attack Neg Hx   . Stroke Neg Hx   . Colon cancer Neg Hx   . Stomach cancer Neg Hx   . Pancreatic cancer Neg Hx   . Esophageal cancer Neg Hx     SOCIAL HISTORY: Social History   Tobacco Use  . Smoking status: Never Smoker  . Smokeless tobacco: Never Used  Substance Use Topics  . Alcohol use: Yes    Comment: occasional    Allergies  Allergen Reactions  . Iodinated Diagnostic Agents Itching, Rash and Other (See Comments)    Systemic itching and transient rash appearance  within hours of receiving contrast    . Prednisone Other (See Comments)    Increases sugar levels  . Adhesive [Tape] Itching and Other (See Comments)    Blisters on arm from adhesive tape at dialysis   . Amoxicillin Itching, Rash and Other (See Comments)    Has patient had a PCN reaction causing immediate rash, facial/tongue/throat swelling, SOB or lightheadedness with hypotension: Yes Has patient had a PCN reaction causing severe rash involving mucus membranes or skin necrosis: No Has patient had a PCN reaction that required hospitalization No Has patient had a PCN reaction occurring within the last 10 years: No If all of the above answers are "NO", then may proceed with Cephalosporin use.  . Clindamycin/Lincomycin Hives and Itching  . Enalapril Rash and Cough    Cough  . Mobic [Meloxicam] Hives and Rash  . Morphine Hives, Nausea Only and Rash  . Vancomycin Hives  . Brilinta [Ticagrelor] Nausea And Vomiting  . Ciprofloxacin Itching and Rash  . Clopidogrel Rash  . Codeine Hives, Itching and Rash  . Doxycycline Rash  . Hydrocodone Rash  . Levofloxacin Hives and Itching  . Oxycodone Rash  . Rocephin [Ceftriaxone] Itching    Current Facility-Administered Medications  Medication Dose Route Frequency Provider Last Rate Last Dose  . acetaminophen (TYLENOL) tablet 650 mg  650 mg Oral Q6H PRN Beard, Samantha N, DO   650 mg at 08/02/18 0354   Or  . acetaminophen (TYLENOL) suppository 650 mg  650 mg Rectal Q6H PRN Beard, Samantha N, DO      . allopurinol (ZYLOPRIM) tablet 200 mg  200 mg Oral Daily Beard, Samantha N, DO      . aspirin EC tablet 81 mg  81 mg Oral QHS Beard, Samantha N, DO      . atorvastatin (LIPITOR) tablet 10 mg  10 mg Oral Daily Beard, Samantha N, DO      . docusate sodium (COLACE) capsule 200 mg  200 mg Oral BID Beard, Samantha N, DO      . gabapentin (NEURONTIN) capsule 100 mg  100 mg Oral QHS Beard, Samantha N, DO      . heparin injection 5,000 Units  5,000 Units  Subcutaneous Q8H Beard, Samantha N, DO      . HYDROmorphone (DILAUDID) tablet 2 mg  2 mg Oral BID PRN Beard, Samantha N, DO      . insulin   aspart (novoLOG) injection 0-9 Units  0-9 Units Subcutaneous Q4H Beard, Samantha N, DO      . insulin glargine (LANTUS) injection 10 Units  10 Units Subcutaneous Daily Beard, Samantha N, DO      . levothyroxine (SYNTHROID, LEVOTHROID) tablet 112 mcg  112 mcg Oral QPM Beard, Samantha N, DO      . polyethylene glycol (MIRALAX / GLYCOLAX) packet 17 g  17 g Oral Daily Beard, Samantha N, DO      . prasugrel (EFFIENT) tablet 10 mg  10 mg Oral Daily Beard, Samantha N, DO        REVIEW OF SYSTEMS:  _0  denotes positive finding, _1  denotes negative finding Cardiac  Comments:  Chest pain or chest pressure:    Shortness of breath upon exertion:    Short of breath when lying flat:    Irregular heart rhythm:        Vascular    Pain in calf, thigh, or hip brought on by ambulation:    Pain in feet at night that wakes you up from your sleep:     Blood clot in your veins:    Leg swelling:           PHYSICAL EXAM: Vitals:   08/02/18 0115 08/02/18 0145 08/02/18 0200 08/02/18 0307  BP: (!) 125/53 (!) 118/54 (!) 133/53 (!) 152/65  Pulse: 91 97 96 (!) 39  Resp: 17 (!) _2 Temp:    98.3 F (36.8 C)  TempSrc:    Oral  SpO2: (!) 86% (!) 88% (!) 89% 92%    GENERAL: The patient is a well-nourished male, in no acute distress. The vital signs are documented above. CARDIOVASCULAR: He does have a pulse in his upper arm fistula with a water hammer nature.  He does not have a bruit. PULMONARY: There is good air exchange  MUSCULOSKELETAL: There are no major deformities or cyanosis. NEUROLOGIC: No focal weakness or paresthesias are detected. SKIN: There are no ulcers or rashes noted. PSYCHIATRIC: The patient has a normal affect.  DATA:  None  MEDICAL ISSUES: Discussed options with the patient.  His fistula itself is not thrombosed throughout its course.  He  does either have a critical narrowing or thrombosis proximally by physical exam.  He does have contrast reaction and requires pretreatment.  He reports that despite prednisone and Benadryl, he still has typically peeling of his skin and rash but this is tolerable.  We will proceed with this later today.  I explained that the operating schedule is very full and this will be later in the afternoon.  So explained that he may require placement of a tunneled catheter if we are unsuccessful and revision of his left arm AV fistula    Rosetta Posner, MD [facility] Tel [phone] Pager [phone]

## 2018-08-02 NOTE — H&P (Addendum)
[facility] Admission History and Physical Service Pager: [phone]  Patient name: Dale [name] Medical record number: [id number] Date of birth: [birth year] Age: 53 y.o. Gender: male  Primary Care Provider: Bonnita Hollow, MD Consultants: Nephrology, vascular surgery Code Status: Full Preferred Emergency Contact: [name], wife, [phone]   Chief Complaint: "AV fistula clotted"   Assessment and Plan:  Dale [name] is a 53 y.o. male presenting with a clotted left upper arm AV fistula.  PMH is significant for ESRD on dialysis MWF, s/p renal transplant (no longer on immunosuppression therapy), type I diabetic on insulin pump, CAD, s/p BKA, macrocytic anemia, and OSA.   AV fistula clot: Acute. Clotted this morning, 4/8, at the initiation of HD, did not receive dialysis today.  Associated with increased pain and minimal bruising at fistula site.  AV graft on left upper arm with palpable thrill, however diminished bruit.  Patient unable to coordinate outpatient thrombectomy today, however through vascular surgery will have a thrombectomy performed tomorrow, 4/9. - Vascular surgery on board, to perform thrombectomy on 4/9 - NPO for procedure -will start heparin per vascular  ESRD on HD, anuric: Chronic, MWF. LUE AV fistula, clotted as above. Nephrology notified while in the ED, likely will proceed with dialysis after thrombectomy. Last received dialysis on Monday, 4/6.  K 5.6, minimal fluid overload on exam with 1+ pitting edema on RLE. S/p renal transplant, failed in 2014, no longer on immunosuppression therapy. - Nephrology on board, appreciate recommendations - Supplementations per nephrology - Daily weights, daily renal panel  T1DM on insulin pump: Chronic, stable. Hgb A1c 8.2 in 05/2018.  Complicated by nephropathy as above, retinopathy, peripheral neuropathy and PVD with associated L BKA amputation. Follows with [facility] endocrinology.   Patient unaware of how much insulin he receives daily through his insulin pump, however last progress note in 04/2018 via endocrinologist stating on average he uses 68 units total through short acting basal/bolus.  CBG 243 on admit.  Patient would prefer not to continue using his insulin pump while admitted, however will readdress in the morning. Given patient is NPO with unclear current requirements, will start insulin conservatively tomorrow and increase as needed if transitioning off pump during this time. - Continue insulin pump tonight, rediscuss in a.m. - Start Lantus 10 units and sensitive SSI in a.m. if transitioning - CBGs every 4 while NPO  - Continue home gabapentin, however decreased to 100 mg daily from 300 for renal dosing   CAD s/p PCI: Chronic, stable. No current angina. PCI to RCA in 2015 and diagonal in 2017.  EKG NSR. Follows with Heart care takes aspirin 81 mg, Effient 10 mg, and Lipitor 10 mg.  Previously on a metoprolol, however not currently on medication list, and suspect this may have been discontinued due to lower blood pressure (does often require midodrine during dialysis).  - Continue home aspirin, Effient, Lipitor   Severe OSA: Chronic. Does not wear CPAP or BiPAP at home due to being uncomfortable to sleep in.  On his own accord, started wearing O2 at night, anywhere from 2-3 L. He has been evaluated by [place] [facility] in the past, however has not followed up since 07/2017. - Oxygen therapy as needed - Encourage pulmonary follow-up  Chronic back pain: Stable. Followed by rheumatology outpatient, attributed to degenerative disease.  Takes Dilaudid 2 mg twice daily at home for pain, already received 2 mg tonight. - Continue home Dilaudid 2 mg twice daily as needed,  to start 4/9 - Continue home bowel regimen including Colace twice daily and MiraLAX daily  Macrocytic anemia: Chronic, stable. Hemoglobin 11.9, baseline around 9.  May be multifactorial given macrocytic in  nature, however likely in setting of history of anemia of chronic disease and iron deficiency anemia.  Vitamin B12, folate above normal in 2019.  Followed by nephrology. - Monitor CBC  Hypothyroidism: Chronic. TSH 3.5 in 01/2018.  Takes Synthroid 112 mcg nightly. - Obtain TSH - Continue home Synthroid 112 mcg    Gout: Chronic, stable. No current flare.  Takes allopurinol daily. - Continue home allopurinol  Chronic hypogonadism: Stable. Receives testosterone injections every 2 weeks outpatient, followed by endocrinology. - Continue injections outpatient    FEN/GI: NPO for procedure on 4/10 Prophylaxis: Heparin SQ   Disposition: Admit to telemetry, attending Dr. Nori Riis  History of Present Illness:  Dale [name] is a 53 y.o. male with history significant for ESRD on dialysis presenting with a clotted AV fistula.   He normally proceeds with hemodialysis on Monday, Wednesday, and Friday.  He attended dialysis this morning, however shortly after being hooked up, the team noted his fistula was not working appropriately and clotted.  Did not receive his dialysis today due to this.  Additionally, patient notes that his "access checks" have been showing a slow downtrend of his fistula flow. Unfortunately, he was unable to make arrangements to have his fistula declotted today.  He states [facility] was only able to do oral meds, but required IV "premeds " for his contrast allergy (itchiness, rash).  He has taken Benadryl and steroids in the past with improvement of adverse effects.   He endorses increasing pain in his left arm superior to his fistula site, but otherwise doing well.  His breathing is at baseline, however does feel slightly fluid overloaded, as it is "time for dialysis".  Denies any new coughing, fever, sick contacts, difficulty breathing, chest pain, paresthesias, or increased swelling.  ED course: On arrival, he was afebrile and hemodynamically stable, with exception of  slightly hypertensive to 159/69.  Labs notable for potassium 5.6, glucose 243, creatinine 12.85, albumin 3.3, WBC 7.2, hemoglobin 11.9.  EKG NSR.  ED physician spoke with vascular surgery, Dr. Donnetta Hutching, who states he would be able to have a thrombectomy performed tomorrow.  Additionally, ED provider spoke with nephrology who recommended [facility] and dialysis in the morning.   Review Of Systems: Per HPI with the following additions:   Review of Systems  Constitutional: Negative for chills, fever and malaise/fatigue.  Respiratory: Negative for cough, shortness of breath and wheezing.   Cardiovascular: Negative for chest pain and orthopnea.  Gastrointestinal: Negative for abdominal pain, constipation, diarrhea, nausea and vomiting.  Musculoskeletal: Negative for falls.  Neurological: Negative for seizures, loss of consciousness and headaches.    Patient Active Problem List   Diagnosis Date Noted  . Upper airway cough syndrome 08/11/2017  . Morbid obesity due to excess calories ([place]) 08/11/2017  . Chronic respiratory failure with hypoxia ([place]) 08/10/2017  . GERD (gastroesophageal reflux disease) 07/29/2017  . Rash and nonspecific skin eruption 07/27/2017  . Contact dermatitis 07/27/2017  . ESRD on hemodialysis ([place])   . Sepsis ([place]) 07/12/2017  . T1DM (type 1 diabetes mellitus) ([place]) 07/12/2017  . Hypotension (arterial) 10/15/2016  . Heme positive stool   . IDDM with complications 56/25/6389  . Oxygen dependent 03/24/2016  . Visit for preventive health examination 03/24/2016  . Chronic diastolic heart failure ([place]) 02/25/2016  . Tracheobronchomalacia  02/04/2016  . Anemia of chronic disease   . Iron deficiency anemia   . Gastrointestinal hemorrhage with melena 12/21/2015  . Pulmonary edema 03/09/2015  . Chronic pain 03/09/2015  . HLD (hyperlipidemia) 11/27/2014  . ESRD on dialysis ([place]) 09/30/2014  . Coronary artery disease involving native coronary artery of native heart with unstable  angina pectoris ([place]) 03/20/2014  . Allergic reaction to dye 03/20/2014  . NSTEMI (non-ST elevated myocardial infarction) ([place]) 03/19/2014  . HTN (hypertension) 02/18/2014  . Abdominal pain 08/27/2011  . Pericardial effusion 08/27/2011  . History of renal transplant 07/22/2011  . S/P BKA (below knee amputation) ([place]) 07/22/2011  . OSA (obstructive sleep apnea) 07/22/2011  . DOE (dyspnea on exertion) 07/22/2011    Past Medical History: Past Medical History:  Diagnosis Date  . Anal fissure   . Anemia of chronic disease   . Arthritis    "hands, right knee" (03/19/2014) no cartlidge in right knee  . CAD (coronary artery disease)    a. Abnl nuc ->LHC (11/15):  mid to dist Dx 80%, mid RCA 99% (functional CTO), dist RCA 80% >> PCI: balloon angioplasty to mid RCA (could not deliver stent) - staged PCI Rotoblator atherectomy/DES to [facility]. b. NSTEMI 12/2015 s/p DES to diagonal.  . Cholecystitis    a. 08/27/2011  . Chronic diastolic CHF (congestive heart failure) ([place])    a. Echo 3/13:  EF 55-60%;  b. Echo (11/15):  Mild LVH, EF 60-65%, no RWMA, Gr 1 DD, MAC, mild LAE, normal RVF, mild RAE;  c.  Echo 5/16:  severe LVH, EF 55-60%, no RWMA, Gr 1 DD, MAC, mild LAE  . Claustrophobia    when things get around his face.   . Complication of anesthesia   . Difficult intubation    only once in 2015 at [facility] haven't had a problem since then  . Diverticulosis   . Esophageal dysmotility   . ESRD (end stage renal disease) ([place])    a. 1995 s/p cadaveric transplant w/ susbequent failure after 18 yrs;  b.Dialysis initiated 07/2011 [facility] M-W-F  . Fibromyalgia   . Gastroparesis   . GERD (gastroesophageal reflux disease)   . Gout    PMH  . History of blood transfusion   . HLD (hyperlipidemia)   . Hypertension   . Hypothyroidism   . Inguinal lymphadenopathy    a. bilateral - s/p biopsy 07/2011  . Insulin dependent diabetes mellitus (HCC)    Type I  . Monilial esophagitis ([place])   . Morbid  obesity ([place])   . OSA (obstructive sleep apnea)    adjustable bed  . PAD (peripheral artery disease) (HCC)    a. s/p L BKA.  [name] Kitchen Pericardial effusion    a.  Small by CT 08/22/11;  b.  Large by CT 08/27/11. c. Not seen on 11/2015 echo.  . Pneumonia ~ 2007  . PONV (postoperative nausea and vomiting)    vomited once after a procedure.  . T1DM (type 1 diabetes mellitus) ([place])   . Tracheobronchomalacia     Past Surgical History: Past Surgical History:  Procedure Laterality Date  . A/V FISTULAGRAM Left 07/26/2016   Procedure: A/V Fistulagram;  Surgeon: Waynetta Sandy, MD;  Location: [place] CV LAB;  Service: Cardiovascular;  Laterality: Left;  . A/V FISTULAGRAM N/A 07/11/2017   Procedure: A/V FISTULAGRAM - Left AV;  Surgeon: Waynetta Sandy, MD;  Location: [place] [facility];  Service: Cardiovascular;  Laterality: N/A;  . AMPUTATION OF REPLICATED  TOES Right   . ANGIOPLASTY  04/09/2014   RCA   . APPENDECTOMY  ~ 2006  . ARTERIOVENOUS GRAFT PLACEMENT Left 1993?   forearm  . ARTERIOVENOUS GRAFT PLACEMENT Right 1993?   leg  . ARTERIOVENOUS GRAFT PLACEMENT Left    Thigh  . Arteriovenous Graft Removed Left    Thigh  . AV FISTULA PLACEMENT  07/29/2011   Procedure: ARTERIOVENOUS (AV) FISTULA CREATION;  Surgeon: Mal Misty, MD;  Location: [place];  Service: Vascular;  Laterality: Right;  Brachial cephalic  . AV FISTULA PLACEMENT Left 01/20/2015   Procedure: LIGATION OF RIGHT ARTERIOVENOUS FISTULA WITH EXCISION OF ANEURYSM;  Surgeon: Angelia Mould, MD;  Location: [place];  Service: Vascular;  Laterality: Left;  . AV FISTULA PLACEMENT Left 04/30/2015   Procedure: CREATION OF LEFT ARM BRACHIO-CEPHALIC ARTERIOVENOUS (AV) FISTULA ;  Surgeon: Angelia Mould, MD;  Location: [place];  Service: Vascular;  Laterality: Left;  . AV Graft PLACEMENT Left 1993?   "attempted one in my wrist; didn't take"  . BELOW KNEE LEG AMPUTATION Left 2010  . CARDIAC CATHETERIZATION  03/19/2014   . CARDIAC CATHETERIZATION  03/19/2014   Procedure: CORONARY BALLOON ANGIOPLASTY;  Surgeon: Burnell Blanks, MD;  Location: [facility] C[facility] LAB;  Service: Cardiovascular;;  . CARDIAC CATHETERIZATION N/A 01/01/2016   Procedure: Left Heart Cath and Coronary Angiography;  Surgeon: Troy Sine, MD;  Location: [place] CV LAB;  Service: Cardiovascular;  Laterality: N/A;  . CARDIAC CATHETERIZATION N/A 01/01/2016   Procedure: Coronary Stent Intervention;  Surgeon: Troy Sine, MD;  Location: [place] CV LAB;  Service: Cardiovascular;  Laterality: N/A;  . CARPAL TUNNEL RELEASE Bilateral after 2005  . CATARACT EXTRACTION W/ INTRAOCULAR LENS  IMPLANT, BILATERAL Bilateral 1990's  . COLONOSCOPY  08/29/2011   Procedure: COLONOSCOPY;  Surgeon: Jerene Bears, MD;  Location: [place];  Service: Gastroenterology;  Laterality: N/A;  . COLONOSCOPY WITH PROPOFOL N/A 04/26/2016   Procedure: COLONOSCOPY WITH PROPOFOL;  Surgeon: Jerene Bears, MD;  Location: [facility] ENDOSCOPY;  Service: Gastroenterology;  Laterality: N/A;  . CORONARY ANGIOPLASTY WITH STENT PLACEMENT  04/09/2014       ptca/des mid lad   . ESOPHAGOGASTRODUODENOSCOPY N/A 12/25/2015   Procedure: ESOPHAGOGASTRODUODENOSCOPY (EGD);  Surgeon: Mauri Pole, MD;  Location: [facility] ENDOSCOPY;  Service: Endoscopy;  Laterality: N/A;  bedside  . ESOPHAGOGASTRODUODENOSCOPY (EGD) WITH PROPOFOL N/A 04/26/2016   Procedure: ESOPHAGOGASTRODUODENOSCOPY (EGD) WITH PROPOFOL;  Surgeon: Jerene Bears, MD;  Location: [facility] ENDOSCOPY;  Service: Gastroenterology;  Laterality: N/A;  . EYE SURGERY Bilateral    cataract  . FEMORAL ARTERY REPAIR  04/09/2014   ANGIOSEAL  . INSERTION OF DIALYSIS CATHETER  08/01/2011   Procedure: INSERTION OF DIALYSIS CATHETER;  Surgeon: Rosetta Posner, MD;  Location: [place];  Service: Vascular;  Laterality: Right;  insertion of dialysis catheter on right internal jugular vein  . INSERTION OF DIALYSIS CATHETER Right 01/20/2015   Procedure: INSERTION OF  DIALYSIS CATHETER - RIGHT INTERNAL JUGULAR ;  Surgeon: Angelia Mould, MD;  Location: [place];  Service: Vascular;  Laterality: Right;  . KIDNEY TRANSPLANT  04/25/1993  . LEFT HEART CATHETERIZATION WITH CORONARY ANGIOGRAM N/A 03/19/2014   Procedure: LEFT HEART CATHETERIZATION WITH CORONARY ANGIOGRAM;  Surgeon: Burnell Blanks, MD;  Location: [facility] CATH LAB;  Service: Cardiovascular;  Laterality: N/A;  . LYMPH NODE BIOPSY  08/10/2011   Procedure: LYMPH NODE BIOPSY;  Surgeon: Merrie Roof, MD;  Location: [place];  Service: General;  Laterality: Left;  left groin lymph biopsy  . NEUROPLASTY / TRANSPOSITION ULNAR NERVE AT ELBOW Left after 2005  . PATCH ANGIOPLASTY Left 08/09/2016   Procedure: LEFT  ARTERIOVENOUS FISTULA PATCH ANGIOPLASTY;  Surgeon: Waynetta Sandy, MD;  Location: [place];  Service: Vascular;  Laterality: Left;  . PERCUTANEOUS CORONARY ROTOBLATOR INTERVENTION (PCI-R) N/A 04/09/2014   Procedure: PERCUTANEOUS CORONARY ROTOBLATOR INTERVENTION (PCI-R);  Surgeon: Burnell Blanks, MD;  Location: [facility] CATH LAB;  Service: Cardiovascular;  Laterality: N/A;  . PERIPHERAL VASCULAR BALLOON ANGIOPLASTY  07/11/2017   Procedure: PERIPHERAL VASCULAR BALLOON ANGIOPLASTY;  Surgeon: Waynetta Sandy, MD;  Location: [place] CV LAB;  Service: Cardiovascular;;  left arm AV fistula  . PERIPHERAL VASCULAR CATHETERIZATION N/A 12/25/2014   Procedure: Upper Extremity Venography;  Surgeon: Conrad Purcell, MD;  Location: [place] CV LAB;  Service: Cardiovascular;  Laterality: N/A;  . PERIPHERAL VASCULAR CATHETERIZATION Left 02/24/2016   Procedure: Fistulagram;  Surgeon: Waynetta Sandy, MD;  Location: [place] CV LAB;  Service: Cardiovascular;  Laterality: Left;  . PERIPHERAL VASCULAR CATHETERIZATION Left 02/24/2016   Procedure: Peripheral Vascular Intervention;  Surgeon: Waynetta Sandy, MD;  Location: [place] CV LAB;  Service: Cardiovascular;  Laterality: Left;   AV FISTULA  . PERITONEAL CATHETER INSERTION    . PERITONEAL CATHETER REMOVAL    . REVISON OF ARTERIOVENOUS FISTULA Right 2/99/3716   Procedure: PLICATION / REVISION OF ARTERIOVENOUS FISTULA;  Surgeon: Angelia Mould, MD;  Location: [place];  Service: Vascular;  Laterality: Right;  . REVISON OF ARTERIOVENOUS FISTULA Left 09/01/2015   Procedure: SUPERFICIALIZATION OF LEFT ARM BRACHIOCEPHALIC ARTERIOVENOUS FISTULA;  Surgeon: Angelia Mould, MD;  Location: [place];  Service: Vascular;  Laterality: Left;  . REVISON OF ARTERIOVENOUS FISTULA Left 08/09/2016   Procedure: REVISION OF ARTERIAL ANASTOMOSIS OF ARTERIOVENOUS FISTULA;  Surgeon: Waynetta Sandy, MD;  Location: [place];  Service: Vascular;  Laterality: Left;  . SHOULDER ARTHROSCOPY Left ~ 2006   frozen  . TOE AMPUTATION Bilateral after 2005  . UMBILICAL HERNIA REPAIR  1990's  . VENOGRAM Right 07/28/2011   Procedure: VENOGRAM;  Surgeon: Conrad [place], MD;  Location: [facility] CATH LAB;  Service: Cardiovascular;  Laterality: Right;  . VITRECTOMY Bilateral     Social History: Social History   Tobacco Use  . Smoking status: Never Smoker  . Smokeless tobacco: Never Used  Substance Use Topics  . Alcohol use: Yes    Comment: occasional  . Drug use: No   Additional social history: Denies any alcohol or illicit drug use.  Non-smoker. Please also refer to relevant sections of EMR.  Family History: Family History  Problem Relation Age of Onset  . Colon polyps Father   . Diabetes Father   . Hypertension Father   . Hypertension Mother   . Diabetes Mother   . Hyperlipidemia Mother   . Breast cancer Maternal Aunt   . Bone cancer Maternal Aunt   . Liver cancer Maternal Aunt   . Malignant hyperthermia Neg Hx   . Heart attack Neg Hx   . Stroke Neg Hx   . Colon cancer Neg Hx   . Stomach cancer Neg Hx   . Pancreatic cancer Neg Hx   . Esophageal cancer Neg Hx     Allergies and Medications: Allergies  Allergen Reactions  .  Iodinated Diagnostic Agents Itching, Rash and Other (See Comments)    Systemic itching and transient rash appearance within hours of receiving contrast    . Prednisone  Other (See Comments)    Increases sugar levels  . Adhesive [Tape] Itching and Other (See Comments)    Blisters on arm from adhesive tape at dialysis   . Amoxicillin Itching, Rash and Other (See Comments)    Has patient had a PCN reaction causing immediate rash, facial/tongue/throat swelling, SOB or lightheadedness with hypotension: Yes Has patient had a PCN reaction causing severe rash involving mucus membranes or skin necrosis: No Has patient had a PCN reaction that required hospitalization No Has patient had a PCN reaction occurring within the last 10 years: No If all of the above answers are "NO", then may proceed with Cephalosporin use.  . Clindamycin/Lincomycin Hives and Itching  . Enalapril Rash and Cough    Cough  . Mobic [Meloxicam] Hives and Rash  . Morphine Hives, Nausea Only and Rash  . Vancomycin Hives  . Brilinta [Ticagrelor] Nausea And Vomiting  . Ciprofloxacin Itching and Rash  . Clopidogrel Rash  . Codeine Hives, Itching and Rash  . Doxycycline Rash  . Hydrocodone Rash  . Levofloxacin Hives and Itching  . Oxycodone Rash  . Rocephin [Ceftriaxone] Itching   No current facility-administered medications on file prior to encounter.    Current Outpatient Medications on File Prior to Encounter  Medication Sig Dispense Refill  . allopurinol (ZYLOPRIM) 100 MG tablet Take 2 tablets (200 mg total) by mouth daily. 180 tablet 3  . AMBULATORY NON FORMULARY MEDICATION Medication Name: Gi cocktail 5-10 mL every 4-6 hours as needed for pain 450 mL 0  . aspirin EC 81 MG tablet Take 81 mg by mouth at bedtime.    [name] Kitchen atorvastatin (LIPITOR) 10 MG tablet Take 1 tablet daily    . calcium acetate (PHOSLO) 667 MG capsule Take 667-1,334 mg by mouth See admin instructions. Take 1,334 mg by mouth three times a day with meals  and 667 mg with each snack    . cephALEXin (KEFLEX) 500 MG capsule Take 1 capsule (500 mg total) by mouth 2 (two) times daily for 14 days. 28 capsule 0  . cetirizine (ZYRTEC) 10 MG tablet Take 10 mg by mouth daily.    . diphenhydrAMINE (BENADRYL) 50 MG tablet Take 50 mg by mouth every 6 (six) hours as needed (for itching or allergic reactions).    . docusate sodium (COLACE) 100 MG capsule Take 200 mg by mouth 2 (two) times daily.     . fluticasone (FLONASE) 50 MCG/ACT nasal spray Place 2 sprays into both nostrils daily as needed for allergies.     [name] Kitchen gabapentin (NEURONTIN) 300 MG capsule Take 1 capsule (300 mg total) by mouth daily. 30 capsule 0  . GLUCAGON EMERGENCY 1 MG injection Inject 1 mg into the muscle daily as needed (low blood sugar).     . hydrocortisone 1 % ointment Apply 1 application topically 2 (two) times daily. 110 g 0  . HYDROmorphone (DILAUDID) 2 MG tablet Take 2 mg by mouth 2 (two) times daily.     . hydrOXYzine (ATARAX/VISTARIL) 10 MG tablet Take 1 tablet (10 mg total) by mouth every 8 (eight) hours as needed for itching or anxiety. 30 tablet 1  . insulin lispro (HUMALOG) 100 UNIT/ML injection Per insulin pump    . levothyroxine (SYNTHROID, LEVOTHROID) 112 MCG tablet Take 112 mcg by mouth every evening. 2 hours after dinner    . midodrine (PROAMATINE) 10 MG tablet Take 1 tablet (10 mg total) by mouth 3 (three) times daily. Takes 1 tab in mornings of dialysis and  takes 2 tabs 30 mins before starting dialysis (m,w,f) (Patient taking differently: Take 10 mg by mouth on the mornings of dialysis (Mon/Wed/Fri) and 20 mg thirty minutes prior to starting dialysis (Mon/Wed/Fri))    . multivitamin (RENA-VIT) TABS tablet Take 1 tablet by mouth daily.    . nitroGLYCERIN (NITROSTAT) 0.4 MG SL tablet Place 1 tablet (0.4 mg total) under the tongue every 5 (five) minutes as needed for chest pain. 25 tablet 4  . Nutritional Supplements (FEEDING SUPPLEMENT, NEPRO CARB STEADY,) LIQD Take 237 mLs by  mouth daily. (Patient taking differently: Take 237 mLs by mouth every Monday, Wednesday, and Friday with hemodialysis. ) 30 Can 0  . Polyethyl Glycol-Propyl Glycol (SYSTANE) 0.4-0.3 % SOLN Place 1 drop into both eyes 3 (three) times daily as needed (for dry eyes).     . polyethylene glycol (MIRALAX / GLYCOLAX) packet Take 17 g by mouth daily. (Patient taking differently: Take 17 g by mouth daily as needed for mild constipation. ) 14 each 0  . prasugrel (EFFIENT) 10 MG TABS tablet TAKE 1 TABLET BY MOUTH EVERY DAY 90 tablet 1  . rOPINIRole (REQUIP) 2 MG tablet Take 1 tablet (2 mg total) by mouth at bedtime. Take 2 mg by mouth every night at bedtime 30 tablet 0  . testosterone cypionate (DEPOTESTOSTERONE CYPIONATE) 200 MG/ML injection INJECT 200 MG INTO THE MUSCLE EVERY 14 DAYS  1  . VELPHORO 500 MG chewable tablet Chew 500-1,000 mg by mouth 3 (three) times daily with meals. Chew 1,000 mg by mouth three times a day with meals and 500 mg with each snack  10    Objective: BP (!) 116/55   Pulse 88   Temp 98.2 F (36.8 C) (Oral)   Resp (!) 22   SpO2 90%  Exam: General: Alert, NAD, sitting up comfortably in bed HEENT: NCAT, MMM  Cardiac: RRR no m/g/r  Lungs: Clear bilaterally, no increased WOB on room air Abdomen: soft large abdomen, non-tender, non-distended, normoactive BS Msk: Moves all extremities spontaneously  Ext: Warm, dry, 2+ distal pulses, 1+ pitting edema to right lower extremity with tense overlying skin, left BKA.  Left upper arm AV fistula with palpable thrill, bruit diminished. Derm: Hyperpigmentation of right lower extremity below knee Psych: Normal affect  Labs and Imaging: CBC BMET  Recent Labs  Lab 08/01/18 2203  WBC 7.2  HGB 11.9*  HCT 38.3*  PLT 251   Recent Labs  Lab 08/01/18 2203  NA 139  K 5.6*  CL 99  CO2 24  BUN 72*  CREATININE 12.85*  GLUCOSE 243*  CALCIUM 9.1     No results found.  Patriciaann Clan, DO 08/02/2018, 12:13 AM PGY-1, [place] pager: [phone], text pages welcome  FPTS Upper-Level Resident Addendum   I have independently interviewed and examined the patient. I have discussed the above with the original author and agree with their documentation. My edits for correction/addition/clarification are in blue. Please see also any attending notes.    Sherene Sires, DO PGY-2, [place] [facility] 08/02/2018 4:17 AM  FPTS Service pager: [phone] (text pages welcome through [facility])

## 2018-08-02 NOTE — Transfer of Care (Signed)
Immediate Anesthesia Transfer of Care Note  Patient: Dale [name]  Procedure(s) Performed: THROMBECTOMY ARTERIOVENOUS FISTULA (Left Arm Upper) Insertion Of Dialysis Catheter (Right Chest)  Patient Location: PACU  Anesthesia Type:General  Level of Consciousness: awake, alert  and oriented  Airway & Oxygen Therapy: Patient Spontanous Breathing and Patient connected to face mask oxygen  Post-op Assessment: Report given to RN and Post -op Vital signs reviewed and stable  Post vital signs: Reviewed and stable  Last Vitals:  Vitals Value Taken Time  BP 139/60 08/02/2018  7:25 PM  Temp    Pulse 93 08/02/2018  7:27 PM  Resp 18 08/02/2018  7:27 PM  SpO2 100 % 08/02/2018  7:27 PM  Vitals shown include unvalidated device data.  Last Pain:  Vitals:   08/02/18 1400  TempSrc:   PainSc: 0-No pain         Complications: No apparent anesthesia complications

## 2018-08-02 NOTE — Discharge Summary (Signed)
[facility] Discharge Summary  Patient name: Dale [name] Medical record number: [id number] Date of birth: 02-02-1966 Age: 53 y.o. Gender: male Date of Admission: 08/01/2018  Date of Discharge: 08/03/2018 Admitting Physician: Dickie La, MD  Primary Care Provider: Bonnita Hollow, MD Consultants: nephrology, VVS  Indication for Hospitalization: fistula thrombus and missed dialysis  Discharge Diagnoses/Problem List:  Fistula thrombus Hyperkalemia ESRD on HD T1DM CAD s/p PCI Severe OSA Chronic back pain/sciatica  Macrocytic anemia Hypothyroidism Gout  Chronic hypogonadism  Disposition: discharge home  Discharge Condition: improved  Discharge Exam:  BP (!) 113/53 (BP Location: Right Arm)   Pulse 90   Temp 97.7 F (36.5 C) (Oral)   Resp 20   Ht 5\' 8"  (1.727 m)   Wt 129.4 kg   SpO2 97%   BMI 43.38 kg/m  General: alert and oriented. No acute distress Cardiovascular: distant heart sounds. Normal rate and rhythm. No murmurs.   Respiratory: LCTAB. No crackles or wheezes.   Abdomen: firm, nontender.  Normal bowel sounds.  Extremities: no pitting edema.    Brief Hospital Course:  Patient admitted after missed dialysis 08/01/2018 due to fistula inaccessible from presumed thrombus. Last dialysis was 07/30/2018.  When patient presented he had hyperkalemia as high as 6.1- he received Lokelma, albuterol, and insulin to reduce K prior to being able to recieve dialysis.  He first received a thrombectomy on 4/9 and an IJ tunneled dialysis catheter was placed to be used for future dialysis appointments until his fistula improved or another fistula was created.  He was then taken for dialysis which he received in the early morning of 4/10.  Nephrology scheduled an outpatient dialysis appointment for him on 4/11.  His electrolytes, specifically K, were improved and he was discharged on 4/10 with dialysis the following day and then he was to resume his normal MWF  schedule.    His diabetes was controlled with his home insulin pump.    Issues for Follow Up:  1. Will need to follow up with nephology regarding the use of his existing fistula and if he will need new access.   2. Would recommend patient be set up with sleep study and strongly encourage CPAP use  Significant Procedures:  Fistula thrombectomy  Significant Labs and Imaging:  Recent Labs  Lab 07/29/18 0102 08/01/18 2203 08/02/18 0542  WBC 8.5 7.2 7.5  HGB 13.3 11.9* 11.3*  HCT 42.6 38.3* 36.7*  PLT 270 251 232   Recent Labs  Lab 07/29/18 0102 08/01/18 2203 08/02/18 0542 08/02/18 1304 08/03/18 0909  NA 128* 139 137 137 136  K 4.7 5.6* 6.1* 4.9 4.9  CL 90* 99 98 96* 94*  CO2 21* 24 23 23 23   GLUCOSE 311* 243* 253* 197* 248*  BUN 59* 72* 79* 82* 37*  CREATININE 10.48* 12.85* 13.83* 14.31* 8.96*  CALCIUM 9.3 9.1 8.8* 9.3 8.6*  MG  --   --  2.7*  --   --   PHOS  --   --  7.5* 6.5* 5.6*  ALKPHOS 109 101  --   --   --   AST 17 16  --   --   --   ALT 19 15  --   --   --   ALBUMIN 3.6 3.3* 3.1* 3.5 3.3*   Results/Tests Pending at Time of Discharge: none  Discharge Medications:  Allergies as of 08/03/2018      Reactions   Iodinated Diagnostic Agents Itching, Rash,  Other (See Comments)   Systemic itching and transient rash appearance within hours of receiving contrast    Prednisone Other (See Comments)   Increases sugar levels   Adhesive [tape] Itching, Other (See Comments)   Blisters on arm from adhesive tape at dialysis   Amoxicillin Itching, Rash, Other (See Comments)   Has patient had a PCN reaction causing immediate rash, facial/tongue/throat swelling, SOB or lightheadedness with hypotension: Yes Has patient had a PCN reaction causing severe rash involving mucus membranes or skin necrosis: No Has patient had a PCN reaction that required hospitalization No Has patient had a PCN reaction occurring within the last 10 years: No If all of the above answers are "NO", then  may proceed with Cephalosporin use.   Clindamycin/lincomycin Hives, Itching   Enalapril Rash, Cough   Cough   Mobic [meloxicam] Hives, Rash   Morphine Hives, Nausea Only, Rash   Vancomycin Hives   Brilinta [ticagrelor] Nausea And Vomiting   Ciprofloxacin Itching, Rash   Clopidogrel Rash   Codeine Hives, Itching, Rash   Doxycycline Rash   Hydrocodone Rash   Levofloxacin Hives, Itching   Oxycodone Rash   Rocephin [ceftriaxone] Itching      Medication List    STOP taking these medications   AMBULATORY NON FORMULARY MEDICATION   cephALEXin 500 MG capsule Commonly known as:  KEFLEX   hydrOXYzine 10 MG tablet Commonly known as:  ATARAX/VISTARIL     TAKE these medications   allopurinol 100 MG tablet Commonly known as:  ZYLOPRIM Take 2 tablets (200 mg total) by mouth daily.   aspirin EC 81 MG tablet Take 81 mg by mouth at bedtime.   atorvastatin 10 MG tablet Commonly known as:  LIPITOR Take 10 mg by mouth daily.   calcium acetate 667 MG capsule Commonly known as:  PHOSLO Take 667-1,334 mg by mouth See admin instructions. Take 1,334 mg by mouth three times a day with meals and 667 mg with each snack   cetirizine 10 MG tablet Commonly known as:  ZYRTEC Take 10 mg by mouth daily.   diphenhydrAMINE 50 MG tablet Commonly known as:  BENADRYL Take 50 mg by mouth every 6 (six) hours as needed (for itching or allergic reactions).   docusate sodium 100 MG capsule Commonly known as:  COLACE Take 100 mg by mouth 2 (two) times daily.   famotidine 20 MG tablet Commonly known as:  PEPCID Take 20 mg by mouth every other day.   feeding supplement (NEPRO CARB STEADY) Liqd Take 237 mLs by mouth daily. What changed:  when to take this   fluticasone 50 MCG/ACT nasal spray Commonly known as:  FLONASE Place 2 sprays into both nostrils daily as needed for allergies.   gabapentin 100 MG capsule Commonly known as:  NEURONTIN Take 1 capsule (100 mg total) by mouth at  bedtime. What changed:    medication strength  how much to take  when to take this   Glucagon Emergency 1 MG injection Generic drug:  glucagon Inject 1 mg into the muscle daily as needed (low blood sugar).   hydrocortisone 1 % ointment Apply 1 application topically 2 (two) times daily.   HYDROmorphone 2 MG tablet Commonly known as:  DILAUDID Take 2 mg by mouth 2 (two) times daily.   insulin lispro 100 UNIT/ML injection Commonly known as:  HUMALOG Per insulin pump   levothyroxine 112 MCG tablet Commonly known as:  SYNTHROID, LEVOTHROID Take 112 mcg by mouth every evening. 2 hours after dinner  metoCLOPramide 10 MG tablet Commonly known as:  REGLAN Take 10 mg by mouth 4 (four) times daily.   midodrine 10 MG tablet Commonly known as:  PROAMATINE Take 1 tablet (10 mg total) by mouth 3 (three) times daily. Takes 1 tab in mornings of dialysis and takes 2 tabs 30 mins before starting dialysis (m,w,f) What changed:    when to take this  additional instructions   multivitamin Tabs tablet Take 1 tablet by mouth daily.   nitroGLYCERIN 0.4 MG SL tablet Commonly known as:  NITROSTAT Place 1 tablet (0.4 mg total) under the tongue every 5 (five) minutes as needed for chest pain.   polyethylene glycol 17 g packet Commonly known as:  MIRALAX / GLYCOLAX Take 17 g by mouth daily. What changed:    when to take this  reasons to take this   prasugrel 10 MG Tabs tablet Commonly known as:  EFFIENT TAKE 1 TABLET BY MOUTH EVERY DAY   promethazine 25 MG tablet Commonly known as:  PHENERGAN Take 25 mg by mouth every 6 (six) hours as needed for nausea or vomiting.   rOPINIRole 2 MG tablet Commonly known as:  REQUIP Take 1 tablet (2 mg total) by mouth at bedtime. Take 2 mg by mouth every night at bedtime   sodium chloride 2 % ophthalmic solution Commonly known as:  MURO 128 Place 1 drop into both eyes at bedtime.   Systane 0.4-0.3 % Soln Generic drug:  Polyethyl  Glycol-Propyl Glycol Place 1 drop into both eyes at bedtime.   testosterone cypionate 200 MG/ML injection Commonly known as:  DEPOTESTOSTERONE CYPIONATE Inject 100 mg into the muscle every 14 (fourteen) days.   Velphoro 500 MG chewable tablet Generic drug:  sucroferric oxyhydroxide Chew 500-1,000 mg by mouth See admin instructions. Chew 1,000 mg by mouth three times a day with meals and 500 mg with each snack       Discharge Instructions: Please refer to Patient Instructions section of EMR for full details.  Patient was counseled important signs and symptoms that should prompt return to medical care, changes in medications, dietary instructions, activity restrictions, and follow up appointments.   Follow-Up Appointments: Follow-up Information    [facility].   Specialty:  Vascular Surgery Why:  As needed Contact information: [place] [phone] [place]. Go on 08/13/2018.   Contact information: [facility] up.   Specialty:  Family Medicine Why:  you have a telemedicine visit scheduled for this date.  someone will call you from the office.   Contact information: [address] [id number] [place]          Benay Pike, MD 08/04/2018, 9:22 AM PGY-1, [place]

## 2018-08-02 NOTE — Anesthesia Procedure Notes (Signed)
Procedure Name: Intubation Date/Time: 08/02/2018 6:43 PM Performed by: Teressa Lower., CRNA Pre-anesthesia Checklist: Patient identified, Emergency Drugs available, Suction available and Patient being monitored Patient Re-evaluated:Patient Re-evaluated prior to induction Oxygen Delivery Method: Circle system utilized Preoxygenation: Pre-oxygenation with 100% oxygen Induction Type: IV induction and Rapid sequence Laryngoscope Size: Glidescope and 4 Grade View: Grade I Tube type: Oral Tube size: 7.5 mm Number of attempts: 1 Airway Equipment and Method: Stylet and Video-laryngoscopy Placement Confirmation: ETT inserted through vocal cords under direct vision,  positive ETCO2 and breath sounds checked- equal and bilateral Secured at: 24 cm Tube secured with: Tape Dental Injury: Teeth and Oropharynx as per pre-operative assessment  Difficulty Due To: Difficulty was anticipated, Difficult Airway- due to large tongue and Difficult Airway-  due to edematous airway Future Recommendations: Recommend- induction with short-acting agent, and alternative techniques readily available

## 2018-08-02 NOTE — Progress Notes (Addendum)
FPTS Interim Progress Note  S: patient states that his insulin pump only has 36 units left and his wife will be delayed in bringing another cartridge. I have concern of her being able to bring in the supplies 2/2 COVID restrictions on visitors.   Update: patient's wife was able to bring in insulin pump equipment  O: BP (!) 152/65 (BP Location: Right Arm)   Pulse (!) 39   Temp 98.3 F (36.8 C) (Oral)   Resp 18   SpO2 92%    General: patient is well-appearing and oriented.   A/P:  Diabetes - consulted diabetes care coordinator - cancelled sSSI for meal and HS coverage with CBG checks - will be utilizing patient's pump - f/u coordinator recommendations and hopefully able to utilize patient's pump  ESRD- hyperkalemia  - Vasc surg following- planning thrombectomy this afternoon. Patient currently NPO and on ASA, prasugrel and hep SQ - patient missed Wednesday dialysis 2/2 fistula clot so will need dialysis s/p procedure- nephrology was contacted in ED. I will reach out to nephrology to make aware if not already tracking  - K+ 6.1 today which is increased since admission (5.6). Lokelma was ordered but dc'ed before administered. ECG on admission without arrhythmia. Will repeat today. With patient being NPO, likely not much insulin will be given to assist with lowering K today. Spoke to nephrology on phone this morning who is planning to treat K+ aggressively. She plans to order lokelma today as well as albuterol and another 10 units lantus and put on d10 fluids PRN prior to his procedure.   - repeat renal panel 1300 today and am  - patient received 5 units novolog  Neuro- patient states he takes 2.5mg  dilaudid BID at home and gabapentin for chronic pain. He states that his pain is poorly controlled at baseline. We discussed the balancing act of controlling his pain but also not wanting to overload him with pain medications considering the increased risk he will be at having missed dialysis  and retains medications longer being ESRD. - no changes to medications at this time as patient is tentatively getting dialysis today. Told patient to page me if he needs anything else.   Richarda Osmond, DO 08/02/2018, 8:12 AM PGY-1, [facility] pager [phone]

## 2018-08-02 NOTE — Progress Notes (Addendum)
New Admission Note:  Arrival Method:Stretcher Mental Orientation: Alert and oriented x 4 Telemetry: None Assessment: Completed Skin: Warm and dry Open area to Right lower leg and pressure ulcer in his right ankle. All covered with pink foam.  IV: NSL Pain: Denies Tubes: N/A Safety Measures: Safety Fall Prevention Plan initiated.  Admission: Completed 5 M  Orientation: Patient has been orientated to the room, unit and the staff. Welcome booklet given.  Family:  Orders have been reviewed and implemented. Will continue to monitor the patient. Call light has been placed within reach and bed alarm has been activated.   Sima Matas BSN, RN  Phone Number: [phone]

## 2018-08-02 NOTE — Op Note (Signed)
° ° °  NAME: Dale [name]    MRN: [id number] DOB: 03/10/1966    DATE OF OPERATION: 08/02/2018  PREOP DIAGNOSIS:    Nonfunctioning left brachiocephalic fistula  POSTOP DIAGNOSIS:    Same  PROCEDURE:    Thrombectomy of left brachiocephalic fistula Ultrasound-guided placement of right IJ 23 cm tunneled dialysis catheter  SURGEON: Judeth Cornfield. Scot Dock, MD, FACS  ASSIST: Dale Locket, PA  ANESTHESIA: General  EBL: Minimal  INDICATIONS:    Dale [name] is a 53 y.o. male whose had a upper arm brachiocephalic fistula on the left for approximately 3 to 4 years.  This apparently became nonfunctional yesterday he was sent to the emergency department.  He has a severe history of dye allergy reportedly and therefore he was not felt to be a candidate for thrombolysis and he was set up for thrombectomy.  FINDINGS:   I retrieved a large amount of organized clot from the fistula however there was still minimal diastolic flow.  For this reason I placed the catheter  TECHNIQUE:   The patient was taken to the operating room and received a general anesthetic.  The left arm was prepped and draped in usual sterile fashion.  A longitudinal incision was made over the proximal fistula where this was dissected free.  It was quite large and aneurysmal.  There was a previous patch there and I made a longitudinal venotomy in the patch once I had proximal distal control and the patient had been heparinized.  I then passed a Fogarty catheter distally for about 22 cm and retrieved a large amount of organized clot which I had identified preoperatively with a duplex.  The catheter would not pass further centrally and the patient has a known cephalic vein stent I suspect that is where the issue was in passing the catheter.  Given that the patient had a severe dye allergy intraoperative fistulogram was not obtained.  At the completion there was not adequate flow in the fistula and therefore elected to place a  catheter.  Hemostasis was obtained in the wound and the wound was closed with a deep layer of 3-0 Vicryl and the skin closed with 4-0 Vicryl.  Patient was then reprepped and draped.  The neck and upper chest were prepped and draped in usual sterile fashion.  Patient was placed in Trendelenburg.  Under ultrasound guidance the right IJ was cannulated with a micropuncture needle and a micropuncture sheath introduced over a wire.  I then placed the J-wire through the micropuncture sheath.  The tract was then dilated and then the dilator and peel-away sheath advanced over the wire and the wire and dilator removed.  23 cm catheter was passed through the peel-away sheath and positioned in the right atrium.  The exit site for the catheter was selected and the catheter brought to the tunnel.  Catheter was cut to the appropriate length.  Both ports withdrew easily with and flushed with heparinized saline and filled with concentrated heparin.  The catheter was secured at its exit site with a 3-0 nylon suture.  The IJ cannulation site was closed with a 4-0 subcuticular stitch.  Dermabond was applied.  Patient tolerated the procedure well was transferred to recovery room in stable condition.  All needle and sponge counts were correct.  Dale Mayo, MD, FACS [facility]  DATE OF DICTATION:   08/02/2018

## 2018-08-03 ENCOUNTER — Encounter (HOSPITAL_COMMUNITY): Payer: Self-pay | Admitting: Vascular Surgery

## 2018-08-03 LAB — GLUCOSE, CAPILLARY
Glucose-Capillary: 190 mg/dL — ABNORMAL HIGH (ref 70–99)
Glucose-Capillary: 199 mg/dL — ABNORMAL HIGH (ref 70–99)

## 2018-08-03 MED ORDER — LIDOCAINE-PRILOCAINE 2.5-2.5 % EX CREA: 1.0000 "application " | TOPICAL_CREAM | CUTANEOUS | Status: DC | PRN

## 2018-08-03 MED ORDER — HEPARIN SODIUM (PORCINE) 1000 UNIT/ML DIALYSIS
5000.0000 [IU] | Freq: Once | INTRAMUSCULAR | Status: AC
  Administered 2018-08-03: 02:00:00 5000 [IU] via INTRAVENOUS_CENTRAL

## 2018-08-03 MED ORDER — HEPARIN SODIUM (PORCINE) 1000 UNIT/ML IJ SOLN
INTRAMUSCULAR | Status: AC
  Administered 2018-08-03: 5000 [IU] via INTRAVENOUS_CENTRAL
  Filled 2018-08-03: qty 5

## 2018-08-03 MED ORDER — HYDROMORPHONE HCL 2 MG PO TABS
2.0000 mg | ORAL_TABLET | Freq: Once | ORAL | Status: AC
  Administered 2018-08-03: 2 mg via ORAL
  Filled 2018-08-03: qty 1

## 2018-08-03 MED ORDER — HYDROMORPHONE HCL 2 MG PO TABS
2.0000 mg | ORAL_TABLET | Freq: Once | ORAL | Status: DC
  Filled 2018-08-03: qty 1

## 2018-08-03 MED ORDER — LIDOCAINE HCL (PF) 1 % IJ SOLN: 5.0000 mL | INTRAMUSCULAR | Status: DC | PRN

## 2018-08-03 MED ORDER — SODIUM CHLORIDE 0.9 % IV SOLN: 100.0000 mL | INTRAVENOUS | Status: DC | PRN

## 2018-08-03 MED ORDER — GABAPENTIN 100 MG PO CAPS: 100.0000 mg | ORAL_CAPSULE | Freq: Every day | ORAL | 0 refills | Status: DC

## 2018-08-03 MED ORDER — PENTAFLUOROPROP-TETRAFLUOROETH EX AERO: 1.0000 "application " | INHALATION_SPRAY | CUTANEOUS | Status: DC | PRN

## 2018-08-03 MED ORDER — HYDROMORPHONE HCL 2 MG PO TABS
2.0000 mg | ORAL_TABLET | Freq: Four times a day (QID) | ORAL | Status: DC | PRN
  Administered 2018-08-03: 2 mg via ORAL
  Filled 2018-08-03: qty 1

## 2018-08-03 NOTE — Progress Notes (Signed)
Patient discharged to home. Patient AVS reviewed and signed. Patient capable re-verbalizing medications and follow-up appointments. IV removed. Patient belongings sent with patient. Patient educated to return to the ED in the event of SOB, chest pain or dizziness.   Misk Galentine B. RN 

## 2018-08-03 NOTE — Discharge Instructions (Signed)
We did not make any changes to your home medications.  Please continue to take them as you were before.  You have a dialysis appointment for Saturday 4/11, but you will go back on your regular schedule after that.        [facility]  Discharge Instructions  AV Fistula or Graft Surgery for Dialysis Access  Please refer to the following instructions for your post-procedure care. Your surgeon or physician assistant will discuss any changes with you.  Activity  You may drive the day following your surgery, if you are comfortable and no longer taking prescription pain medication. Resume full activity as the soreness in your incision resolves.  Bathing/Showering  You may shower after you go home. Keep your incision dry for 48 hours. Do not soak in a bathtub, hot tub, or swim until the incision heals completely. You may not shower if you have a hemodialysis catheter.  Incision Care  Clean your incision with mild soap and water after 48 hours. Pat the area dry with a clean towel. You do not need a bandage unless otherwise instructed. Do not apply any ointments or creams to your incision. You may have skin glue on your incision. Do not peel it off. It will come off on its own in about one week. Your arm may swell a bit after surgery. To reduce swelling use pillows to elevate your arm so it is above your heart. Your doctor will tell you if you need to lightly wrap your arm with an ACE bandage.  Diet  Resume your normal diet. There are not special food restrictions following this procedure. In order to heal from your surgery, it is CRITICAL to get adequate nutrition. Your body requires vitamins, minerals, and protein. Vegetables are the best source of vitamins and minerals. Vegetables also provide the perfect balance of protein. Processed food has little nutritional value, so try to avoid this.  Medications  Resume taking all of your medications. If your incision  is causing pain, you may take over-the counter pain relievers such as acetaminophen (Tylenol). If you were prescribed a stronger pain medication, please be aware these medications can cause nausea and constipation. Prevent nausea by taking the medication with a snack or meal. Avoid constipation by drinking plenty of fluids and eating foods with high amount of fiber, such as fruits, vegetables, and grains.  Do not take Tylenol if you are taking prescription pain medications.  Follow up Your surgeon may want to see you in the office following your access surgery. If so, this will be arranged at the time of your surgery.  Please call us immediately for any of the following conditions:  Increased pain, redness, drainage (pus) from your incision site Fever of 101 degrees or higher Severe or worsening pain at your incision site Hand pain or numbness.  Reduce your risk of vascular disease:  Stop smoking. If you would like help, call [facility] at 1-800-QUIT-NOW 707-745-1318) or [place] at [place] your cholesterol Maintain a desired weight Control your diabetes Keep your blood pressure down  Dialysis  It will take several weeks to several months for your new dialysis access to be ready for use. Your surgeon will determine when it is okay to use it. Your nephrologist will continue to direct your dialysis. You can continue to use your Permcath until your new access is ready for use.   08/03/2018 Dale [name] [id number] Jun 15, 1965  Surgeon(s): Angelia Mould, MD  Procedure(s): THROMBECTOMY ARTERIOVENOUS FISTULA Insertion Of Dialysis Catheter =  x May stick graft on designated area only:  Do not stick over incision x 6 months. May stick above incision now.   If you have any questions, please call the office at [phone].

## 2018-08-03 NOTE — Progress Notes (Signed)
  Progress Note    08/03/2018 8:58 AM 1 Day Post-Op  Subjective:  Dialyzed via R IJ TDC early this morning   Vitals:   08/03/18 0600 08/03/18 0641  BP: (!) 101/38 (!) 113/53  Pulse: 94 90  Resp:  20  Temp:  97.7 F (36.5 C)  SpO2:  97%   Physical Exam: Lungs:  Non labored Incisions:  L antecubitum incision c/d/i Extremities:  L hand warm and well perfused; strong palpable pulse through L arm fistula but no thrill Neurologic: A&O  CBC    Component Value Date/Time   WBC 7.5 08/02/2018 0542   RBC 3.46 (L) 08/02/2018 0542   HGB 11.3 (L) 08/02/2018 0542   HGB 12.6 (L) 11/29/2016 1454   HCT 36.7 (L) 08/02/2018 0542   HCT 30.7 (L) 07/13/2017 0455   PLT 232 08/02/2018 0542   PLT 278 11/29/2016 1454   MCV 106.1 (H) 08/02/2018 0542   MCV 108 (H) 11/29/2016 1454   MCH 32.7 08/02/2018 0542   MCHC 30.8 08/02/2018 0542   RDW 14.2 08/02/2018 0542   RDW 15.2 11/29/2016 1454   LYMPHSABS 1.5 08/01/2018 2203   LYMPHSABS 1.6 11/29/2016 1454   MONOABS 0.6 08/01/2018 2203   EOSABS 0.3 08/01/2018 2203   EOSABS 0.3 11/29/2016 1454   BASOSABS 0.1 08/01/2018 2203   BASOSABS 0.0 11/29/2016 1454    BMET    Component Value Date/Time   NA 137 08/02/2018 1304   NA 140 11/29/2016 1454   K 4.9 08/02/2018 1304   CL 96 (L) 08/02/2018 1304   CO2 23 08/02/2018 1304   GLUCOSE 197 (H) 08/02/2018 1304   BUN 82 (H) 08/02/2018 1304   BUN 38 (H) 11/29/2016 1454   CREATININE 14.31 (H) 08/02/2018 1304   CALCIUM 9.3 08/02/2018 1304   GFRNONAA 3 (L) 08/02/2018 1304   GFRAA 4 (L) 08/02/2018 1304    INR    Component Value Date/Time   INR 1.1 08/02/2018 0904     Intake/Output Summary (Last 24 hours) at 08/03/2018 0859 Last data filed at 08/03/2018 0854 Gross per 24 hour  Intake 1280 ml  Output 4794 ml  Net -3514 ml     Assessment/Plan:  53 y.o. male is s/p thrombectomy of L arm fistula with placement of R IJ TDC 1 Day Post-Op   L arm fistula patent with strong pulse but no thrill  Given severe contrast allergy, fistulogram was not performed [facility] placed as backup Will likely need to pursue new access in the future  Dagoberto Ligas, PA-C [facility] [phone] 08/03/2018 8:59 AM

## 2018-08-03 NOTE — Progress Notes (Addendum)
[facility] Progress Note   Subjective: To OR yesterday, had thrombectomy L AVF, RIJ TDC placed. Fistulogram deferred D/T contrast allergy. Finished HD around 0600 today. C/O pain RIJ TDC site.    Objective Vitals:   08/03/18 0500 08/03/18 0530 08/03/18 0600 08/03/18 0641  BP: (!) 103/49 (!) 99/51 (!) 101/38 (!) 113/53  Pulse: 86 89 94 90  Resp:    20  Temp:    97.7 F (36.5 C)  TempSrc:    Oral  SpO2:    97%  Weight:    129.4 kg  Height:       Physical Exam General:Pleasant obese male in NAD Heart: S1,S2 RRR  Lungs: CTAB A/P Abdomen: obese, S, NT Extremities: L BKA no stump edema. No edema RLE Dialysis Access: RIJ TDC drsg CDI. L AVF no bruit.    Additional Objective Labs: Basic Metabolic Panel: Recent Labs  Lab 08/02/18 0542 08/02/18 1304 08/03/18 0909  NA 137 137 136  K 6.1* 4.9 4.9  CL 98 96* 94*  CO2 23 23 23   GLUCOSE 253* 197* 248*  BUN 79* 82* 37*  CREATININE 13.83* 14.31* 8.96*  CALCIUM 8.8* 9.3 8.6*  PHOS 7.5* 6.5* 5.6*   Liver Function Tests: Recent Labs  Lab 07/29/18 0102 08/01/18 2203 08/02/18 0542 08/02/18 1304 08/03/18 0909  AST 17 16  --   --   --   ALT 19 15  --   --   --   ALKPHOS 109 101  --   --   --   BILITOT 0.5 0.4  --   --   --   PROT 8.1 7.3  --   --   --   ALBUMIN 3.6 3.3* 3.1* 3.5 3.3*   No results for input(s): LIPASE, AMYLASE in the last 168 hours. CBC: Recent Labs  Lab 07/29/18 0102 08/01/18 2203 08/02/18 0542  WBC 8.5 7.2 7.5  NEUTROABS 5.5 4.7  --   HGB 13.3 11.9* 11.3*  HCT 42.6 38.3* 36.7*  MCV 106.2* 106.1* 106.1*  PLT 270 251 232   Blood Culture    Component Value Date/Time   SDES BLOOD BLOOD RIGHT WRIST 07/12/2017 1159   SPECREQUEST  07/12/2017 1159    BOTTLES DRAWN AEROBIC AND ANAEROBIC Blood Culture adequate volume   CULT  07/12/2017 1159    NO GROWTH 5 DAYS Performed at [facility], [place] [address] [place], [place] [address]    REPTSTATUS 07/17/2017 FINAL 07/12/2017 1159     Cardiac Enzymes: Recent Labs  Lab 07/29/18 0102  TROPONINI <0.03   CBG: Recent Labs  Lab 08/02/18 1133 08/02/18 1520 08/02/18 1718 08/02/18 1928 08/03/18 0721  GLUCAP 271* 103* 69* 96 190*   Iron Studies: No results for input(s): IRON, TIBC, TRANSFERRIN, FERRITIN in the last 72 hours. @lablastinr3 @ Studies/Results: Dg Chest Port 1 View  Result Date: 08/02/2018 CLINICAL DATA:  End-stage renal disease.  Central catheter placement EXAM: PORTABLE CHEST 1 VIEW COMPARISON:  July 29, 2018 FINDINGS: Central catheter tip is in the superior vena cava. No pneumothorax. There is mild left base atelectasis. There is no edema or consolidation. Heart is slightly enlarged with pulmonary vascularity normal. No adenopathy. A stent is noted in the left subclavian region. IMPRESSION: Central catheter tip in superior vena cava. No pneumothorax. Mild left base atelectasis. No edema or consolidation. Stable cardiac prominence. Electronically Signed   By: Lowella Grip III M.D.   On: 08/02/2018 20:14   Dg Fluoro Guide Cv Line-no Report  Result Date: 08/02/2018 Fluoroscopy was utilized by the requesting physician.  No radiographic interpretation.   Medications: . sodium chloride    . sodium chloride    . sodium chloride Stopped (08/02/18 1928)   . allopurinol  200 mg Oral Daily  . aspirin EC  81 mg Oral QHS  . atorvastatin  10 mg Oral Daily  . Chlorhexidine Gluconate Cloth  6 each Topical Q0600  . docusate sodium  200 mg Oral BID  . doxercalciferol  5 mcg Intravenous Q M,W,F-HD  . gabapentin  100 mg Oral QHS  . heparin  5,000 Units Subcutaneous Q8H  . insulin pump   Subcutaneous TID AC, HS, 0200  . levothyroxine  112 mcg Oral QPM  . mupirocin ointment  1 application Nasal BID  . polyethylene glycol  17 g Oral Daily  . prasugrel  10 mg Oral Daily     Dialysis Orders: [facility] MWF 4 hr 15 min 180NRe 450/800 127.4 kg 2.0 K/ 2.25 Ca UFP 4 Linear Sodium -Heparin 5000 units IV TIW -Hectorol 5  mcg IV TIW  Assessment/Plan: 1.  Clotted AV access: Seen by Dr. Scot Dock 08/02/18. Has H/O contrast dye allergy, fistulogram deferred. Had thrombectomy/TDC placement. Will probably need new access-follow up with [facility] as OP.  2. Hyperglycemia/DMT1-Better control today.  S/S coverage. Normal AG. Insulin pump on hold.  3. Hyperkalemia-Resolved with medical treatment/HD. K+ 4.9 this AM 4. ESRD - MWF-Finished HD around 0600 this AM. Unfortunately still about 2 kg above OP EDW. Called OP center, scheduled extra treatment for him tomorrow.  5.  Hypertension/volume  -Volume improved with HD. Net UF 4774 but post wt 129.4-still 2 kgs above EDW. Plan for extra tx at OP center tomorrow as he has H/O high IDWG. BP controlled on low side.  6.  Anemia  - HGB 11.3 No ESA needed.  7.  Metabolic bone disease -  Ca 8.8 C Ca 9.5 Phos 5.6. On calcium acetate and velphoro binders. Continue binders/VDRA.  8.  Nutrition - Albumin 3.1. Renal/Carb mod diet, renal vit,  9. H/O CAD 10.  Severe OSA-not on CPAP at home. Per primary 11.  Chronic back pain-per primary.   Disposition: Discussed with primary team. Would like to discharge him home today. Has extra HD treatment scheduled at [facility] 08/04/18. OP Center will contact him with time of treatment. He will F/U with [facility] as OP.   Stclair Szymborski H. Karole Oo NP-C 08/03/2018, 10:15 AM  [name] [phone]

## 2018-08-04 DIAGNOSIS — E1022 Type 1 diabetes mellitus with diabetic chronic kidney disease: Secondary | ICD-10-CM | POA: Diagnosis not present

## 2018-08-04 DIAGNOSIS — D631 Anemia in chronic kidney disease: Secondary | ICD-10-CM | POA: Diagnosis not present

## 2018-08-04 DIAGNOSIS — T82818A Embolism of vascular prosthetic devices, implants and grafts, initial encounter: Secondary | ICD-10-CM | POA: Diagnosis not present

## 2018-08-04 DIAGNOSIS — D689 Coagulation defect, unspecified: Secondary | ICD-10-CM | POA: Diagnosis not present

## 2018-08-04 DIAGNOSIS — N186 End stage renal disease: Secondary | ICD-10-CM | POA: Diagnosis not present

## 2018-08-04 DIAGNOSIS — N2581 Secondary hyperparathyroidism of renal origin: Secondary | ICD-10-CM | POA: Diagnosis not present

## 2018-08-04 LAB — RENAL FUNCTION PANEL
Albumin: 3.3 g/dL — ABNORMAL LOW (ref 3.5–5.0)
Anion gap: 19 — ABNORMAL HIGH (ref 5–15)
BUN: 37 mg/dL — ABNORMAL HIGH (ref 6–20)
CO2: 23 mmol/L (ref 22–32)
Calcium: 8.6 mg/dL — ABNORMAL LOW (ref 8.9–10.3)
Chloride: 94 mmol/L — ABNORMAL LOW (ref 98–111)
Creatinine, Ser: 8.96 mg/dL — ABNORMAL HIGH (ref 0.61–1.24)
GFR calc Af Amer: 7 mL/min — ABNORMAL LOW (ref >60)
GFR calc non Af Amer: 6 mL/min — ABNORMAL LOW (ref >60)
Glucose, Bld: 248 mg/dL — ABNORMAL HIGH (ref 70–99)
Phosphorus: 5.6 mg/dL — ABNORMAL HIGH (ref 2.5–4.6)
Potassium: 4.9 mmol/L (ref 3.5–5.1)
Sodium: 136 mmol/L (ref 135–145)

## 2018-08-06 DIAGNOSIS — T82818A Embolism of vascular prosthetic devices, implants and grafts, initial encounter: Secondary | ICD-10-CM | POA: Diagnosis not present

## 2018-08-06 DIAGNOSIS — D689 Coagulation defect, unspecified: Secondary | ICD-10-CM | POA: Diagnosis not present

## 2018-08-06 DIAGNOSIS — N2581 Secondary hyperparathyroidism of renal origin: Secondary | ICD-10-CM | POA: Diagnosis not present

## 2018-08-06 DIAGNOSIS — D631 Anemia in chronic kidney disease: Secondary | ICD-10-CM | POA: Diagnosis not present

## 2018-08-06 DIAGNOSIS — N186 End stage renal disease: Secondary | ICD-10-CM | POA: Diagnosis not present

## 2018-08-06 DIAGNOSIS — E1022 Type 1 diabetes mellitus with diabetic chronic kidney disease: Secondary | ICD-10-CM | POA: Diagnosis not present

## 2018-08-07 DIAGNOSIS — E291 Testicular hypofunction: Secondary | ICD-10-CM | POA: Diagnosis not present

## 2018-08-07 DIAGNOSIS — E1022 Type 1 diabetes mellitus with diabetic chronic kidney disease: Secondary | ICD-10-CM | POA: Diagnosis not present

## 2018-08-07 DIAGNOSIS — E782 Mixed hyperlipidemia: Secondary | ICD-10-CM | POA: Diagnosis not present

## 2018-08-07 DIAGNOSIS — Z992 Dependence on renal dialysis: Secondary | ICD-10-CM | POA: Diagnosis not present

## 2018-08-07 DIAGNOSIS — E039 Hypothyroidism, unspecified: Secondary | ICD-10-CM | POA: Diagnosis not present

## 2018-08-07 DIAGNOSIS — N186 End stage renal disease: Secondary | ICD-10-CM | POA: Diagnosis not present

## 2018-08-08 DIAGNOSIS — N186 End stage renal disease: Secondary | ICD-10-CM | POA: Diagnosis not present

## 2018-08-08 DIAGNOSIS — D631 Anemia in chronic kidney disease: Secondary | ICD-10-CM | POA: Diagnosis not present

## 2018-08-08 DIAGNOSIS — E1022 Type 1 diabetes mellitus with diabetic chronic kidney disease: Secondary | ICD-10-CM | POA: Diagnosis not present

## 2018-08-08 DIAGNOSIS — T82818A Embolism of vascular prosthetic devices, implants and grafts, initial encounter: Secondary | ICD-10-CM | POA: Diagnosis not present

## 2018-08-08 DIAGNOSIS — N2581 Secondary hyperparathyroidism of renal origin: Secondary | ICD-10-CM | POA: Diagnosis not present

## 2018-08-08 DIAGNOSIS — D689 Coagulation defect, unspecified: Secondary | ICD-10-CM | POA: Diagnosis not present

## 2018-08-09 DIAGNOSIS — N186 End stage renal disease: Secondary | ICD-10-CM | POA: Diagnosis not present

## 2018-08-09 DIAGNOSIS — D631 Anemia in chronic kidney disease: Secondary | ICD-10-CM | POA: Diagnosis not present

## 2018-08-09 DIAGNOSIS — N2581 Secondary hyperparathyroidism of renal origin: Secondary | ICD-10-CM | POA: Diagnosis not present

## 2018-08-09 DIAGNOSIS — T82818A Embolism of vascular prosthetic devices, implants and grafts, initial encounter: Secondary | ICD-10-CM | POA: Diagnosis not present

## 2018-08-09 DIAGNOSIS — E1022 Type 1 diabetes mellitus with diabetic chronic kidney disease: Secondary | ICD-10-CM | POA: Diagnosis not present

## 2018-08-09 DIAGNOSIS — D689 Coagulation defect, unspecified: Secondary | ICD-10-CM | POA: Diagnosis not present

## 2018-08-10 DIAGNOSIS — T82818A Embolism of vascular prosthetic devices, implants and grafts, initial encounter: Secondary | ICD-10-CM | POA: Diagnosis not present

## 2018-08-10 DIAGNOSIS — N2581 Secondary hyperparathyroidism of renal origin: Secondary | ICD-10-CM | POA: Diagnosis not present

## 2018-08-10 DIAGNOSIS — D689 Coagulation defect, unspecified: Secondary | ICD-10-CM | POA: Diagnosis not present

## 2018-08-10 DIAGNOSIS — E1022 Type 1 diabetes mellitus with diabetic chronic kidney disease: Secondary | ICD-10-CM | POA: Diagnosis not present

## 2018-08-10 DIAGNOSIS — N186 End stage renal disease: Secondary | ICD-10-CM | POA: Diagnosis not present

## 2018-08-10 DIAGNOSIS — D631 Anemia in chronic kidney disease: Secondary | ICD-10-CM | POA: Diagnosis not present

## 2018-08-13 ENCOUNTER — Telehealth (INDEPENDENT_AMBULATORY_CARE_PROVIDER_SITE_OTHER): Payer: Medicare Other | Admitting: Family Medicine

## 2018-08-13 ENCOUNTER — Other Ambulatory Visit: Payer: Self-pay

## 2018-08-13 DIAGNOSIS — D631 Anemia in chronic kidney disease: Secondary | ICD-10-CM | POA: Diagnosis not present

## 2018-08-13 DIAGNOSIS — N186 End stage renal disease: Secondary | ICD-10-CM | POA: Diagnosis not present

## 2018-08-13 DIAGNOSIS — T82818A Embolism of vascular prosthetic devices, implants and grafts, initial encounter: Secondary | ICD-10-CM | POA: Diagnosis not present

## 2018-08-13 DIAGNOSIS — E1022 Type 1 diabetes mellitus with diabetic chronic kidney disease: Secondary | ICD-10-CM | POA: Diagnosis not present

## 2018-08-13 DIAGNOSIS — I950 Idiopathic hypotension: Secondary | ICD-10-CM

## 2018-08-13 DIAGNOSIS — R599 Enlarged lymph nodes, unspecified: Secondary | ICD-10-CM | POA: Insufficient documentation

## 2018-08-13 DIAGNOSIS — N2581 Secondary hyperparathyroidism of renal origin: Secondary | ICD-10-CM | POA: Diagnosis not present

## 2018-08-13 DIAGNOSIS — D689 Coagulation defect, unspecified: Secondary | ICD-10-CM | POA: Diagnosis not present

## 2018-08-13 NOTE — Progress Notes (Signed)
[place] Telemedicine Visit  Patient consented to have virtual visit. Method of visit: Video  Encounter participants: Patient: Dale [name] - located at home  Provider: Patriciaann Clan - located at [facility] Others (if applicable): Wife present    Chief Complaint: Hospital f/u   HPI: [name] is a 53 year old gentleman presenting via video to discuss the following:   Hospital Follow Up: Admitted for thrombectomy secondary to clotted AV fistula on left arm, from 4/7-4/10.  He had a right IJ catheter placed during thrombectomy, his AV fistula still had minimal thrill.  They have been using his catheter for dialysis successfully.  Additionally, patient reports they wanted to wait to use his AV fistula for at least 6 weeks in hopes that it may be improved.  He will be following up with his nephrologist and will call to schedule an appointment with vein and vascular.  His catheter site has been slightly sore, however is been well managed with his Dilaudid 2 mg twice daily as needed that he already takes on a chronic basis at home.  Denies any drainage, erythema, warmth to touch, or swelling around the catheter or AV fistula incision site.  No fevers.  Hypotension: Reports he just recently got home from dialysis, feeling a little lightheaded.  Took his blood pressure prior to appointment, 71/54.  Usually runs 333-545 systolic.  Takes midodrine often, during dialysis and on "off days "if his blood pressure runs too low.  Just took a midodrine 10 minutes before conversation, BP slightly up to 79/66 near the end of our conversation and feeling slightly better. Checked his glucose, 144. He is also drinking pickle juice, which he has been informed to do in the past to help raise his blood pressure per his nephrology team.  Took off 3.6L today because he was only for KG over his dry weight.  Feeling lightheaded and having lower blood pressures after dialysis happens occasionally for him,  thinks he may need to increase his dry weight for dialysis.  Wheelchair-bound, does not stand up with BKA.  Denies any fever, dizziness, tunnel vision.  F/u Thigh pain/enlarged lymph nodes: Recently seen by Dr. Oneida Alar, vascular surgery on 3/26 for history of right medial thigh swelling/redness/tenderness.  They performed an ultrasound of that area, no IVC thrombosis but noted inguinal lymph nodes.  Patient also reports he did have a history of enlarged inguinal lymph nodes in 2013, biopsy showing plasmacytosis.  Afterwards he heard no further and wanted to make sure this was followed up.  He is no longer having any swelling, redness, or tenderness in his thigh region.  Denies any enlarged lymph nodes elsewhere, no masses he has noticed, unexpected weight loss, night sweats, fever, or chills.  Through further chart review, found surgical pathology results from specimen collection on 08/05/2011 which ultimately displayed an atypical lymphoid proliferation and recommended excisional biopsy for further evaluation to rule out a lympho-proliferative process in the setting of previous renal transplant.     ROS: per HPI  Pertinent PMHx: ESRD on hemodialysis, BKA, chronic hypotension.   Exam:  General: Alert older gentleman, in no acute distress Respiratory: Breathing comfortably, speaking in full sentences Derm: AV fistula incision site and right catheter insertion appear clean, dry, and intact.  No surrounding erythema or swelling noted.  Patient denies any warmth to touch.   Assessment/Plan:  Hypotension (arterial) Likely in the setting of recent dialysis associated with some lightheadedness, patient is concerned that he  may need to increase his dry weight in the future.  With blood pressure cuff at home, will have patient check his blood pressure for the next 1-2 hours, if symptomatology and blood pressure not improving, will touch base with his nephrologist for further recommendations.  Already on  midodrine, took one immediately prior to our conversation, and currently drinking pickle juice to help elevate his pressures.  Encouraged he was already starting to feel better by the end of our conversation and blood pressure was slightly increased (however still low).   Lymph node enlargement Right inguinal region, persistent and chronic in nature. Present on imaging, has not been palpated per patient, fortunately now asymptomatic without concerning systemic symptoms. Previously biopsied in 2013 which displayed an atypical lymphoid proliferation and recommended excisional biopsy which does not appear to have been completed and denies being evaluated by a hematologist.  Differential remains broad, may be secondary to previous infection/reactive in nature vs lympho-proliferative disorder/MGUS/myeloma.  Recent CBC/CMP in 07/2018 with WBC and calcium wnl, chronically anemic in the setting of renal disease. - Will set up formal inguinal ultrasound to measure and characterize lymphadenopathy - Following this, likely need to move forward with excisional biopsy especially as this was recommended in the past - Consider hematology referral - Will route this to PCP to ensure appropriate follow-up - Discussed case with Dr. Lunette Stands    Time spent during visit with patient: 20 minutes  Patriciaann Clan, DO

## 2018-08-13 NOTE — Assessment & Plan Note (Addendum)
Right inguinal region, persistent and chronic in nature. Present on imaging, has not been palpated per patient, fortunately now asymptomatic without concerning systemic symptoms. Previously biopsied in 2013 which displayed an atypical lymphoid proliferation and recommended excisional biopsy which does not appear to have been completed and denies being evaluated by a hematologist.  Differential remains broad, may be secondary to previous infection/reactive in nature vs lympho-proliferative disorder/MGUS/myeloma.  Recent CBC/CMP in 07/2018 with WBC and calcium wnl, chronically anemic in the setting of renal disease. - Will set up formal inguinal ultrasound to measure and characterize lymphadenopathy - Following this, likely need to move forward with excisional biopsy especially as this was recommended in the past - Consider hematology referral - Will route this to PCP to ensure appropriate follow-up - Discussed case with Dr. Lunette Stands

## 2018-08-13 NOTE — Assessment & Plan Note (Signed)
Likely in the setting of recent dialysis associated with some lightheadedness, patient is concerned that he may need to increase his dry weight in the future.  With blood pressure cuff at home, will have patient check his blood pressure for the next 1-2 hours, if symptomatology and blood pressure not improving, will touch base with his nephrologist for further recommendations.  Already on midodrine, took one immediately prior to our conversation, and currently drinking pickle juice to help elevate his pressures.  Encouraged he was already starting to feel better by the end of our conversation and blood pressure was slightly increased (however still low).

## 2018-08-15 ENCOUNTER — Other Ambulatory Visit: Payer: Medicare Other

## 2018-08-15 DIAGNOSIS — E1022 Type 1 diabetes mellitus with diabetic chronic kidney disease: Secondary | ICD-10-CM | POA: Diagnosis not present

## 2018-08-15 DIAGNOSIS — N2581 Secondary hyperparathyroidism of renal origin: Secondary | ICD-10-CM | POA: Diagnosis not present

## 2018-08-15 DIAGNOSIS — D689 Coagulation defect, unspecified: Secondary | ICD-10-CM | POA: Diagnosis not present

## 2018-08-15 DIAGNOSIS — N186 End stage renal disease: Secondary | ICD-10-CM | POA: Diagnosis not present

## 2018-08-15 DIAGNOSIS — T82818A Embolism of vascular prosthetic devices, implants and grafts, initial encounter: Secondary | ICD-10-CM | POA: Diagnosis not present

## 2018-08-15 DIAGNOSIS — D631 Anemia in chronic kidney disease: Secondary | ICD-10-CM | POA: Diagnosis not present

## 2018-08-17 DIAGNOSIS — N186 End stage renal disease: Secondary | ICD-10-CM | POA: Diagnosis not present

## 2018-08-17 DIAGNOSIS — T82818A Embolism of vascular prosthetic devices, implants and grafts, initial encounter: Secondary | ICD-10-CM | POA: Diagnosis not present

## 2018-08-17 DIAGNOSIS — E1022 Type 1 diabetes mellitus with diabetic chronic kidney disease: Secondary | ICD-10-CM | POA: Diagnosis not present

## 2018-08-17 DIAGNOSIS — D689 Coagulation defect, unspecified: Secondary | ICD-10-CM | POA: Diagnosis not present

## 2018-08-17 DIAGNOSIS — D631 Anemia in chronic kidney disease: Secondary | ICD-10-CM | POA: Diagnosis not present

## 2018-08-17 DIAGNOSIS — N2581 Secondary hyperparathyroidism of renal origin: Secondary | ICD-10-CM | POA: Diagnosis not present

## 2018-08-20 DIAGNOSIS — T82818A Embolism of vascular prosthetic devices, implants and grafts, initial encounter: Secondary | ICD-10-CM | POA: Diagnosis not present

## 2018-08-20 DIAGNOSIS — N2581 Secondary hyperparathyroidism of renal origin: Secondary | ICD-10-CM | POA: Diagnosis not present

## 2018-08-20 DIAGNOSIS — D631 Anemia in chronic kidney disease: Secondary | ICD-10-CM | POA: Diagnosis not present

## 2018-08-20 DIAGNOSIS — E1022 Type 1 diabetes mellitus with diabetic chronic kidney disease: Secondary | ICD-10-CM | POA: Diagnosis not present

## 2018-08-20 DIAGNOSIS — N186 End stage renal disease: Secondary | ICD-10-CM | POA: Diagnosis not present

## 2018-08-20 DIAGNOSIS — D689 Coagulation defect, unspecified: Secondary | ICD-10-CM | POA: Diagnosis not present

## 2018-08-21 ENCOUNTER — Other Ambulatory Visit: Payer: Medicare Other

## 2018-08-22 DIAGNOSIS — N2581 Secondary hyperparathyroidism of renal origin: Secondary | ICD-10-CM | POA: Diagnosis not present

## 2018-08-22 DIAGNOSIS — E1022 Type 1 diabetes mellitus with diabetic chronic kidney disease: Secondary | ICD-10-CM | POA: Diagnosis not present

## 2018-08-22 DIAGNOSIS — N186 End stage renal disease: Secondary | ICD-10-CM | POA: Diagnosis not present

## 2018-08-22 DIAGNOSIS — T82818A Embolism of vascular prosthetic devices, implants and grafts, initial encounter: Secondary | ICD-10-CM | POA: Diagnosis not present

## 2018-08-22 DIAGNOSIS — D689 Coagulation defect, unspecified: Secondary | ICD-10-CM | POA: Diagnosis not present

## 2018-08-22 DIAGNOSIS — D631 Anemia in chronic kidney disease: Secondary | ICD-10-CM | POA: Diagnosis not present

## 2018-08-23 ENCOUNTER — Other Ambulatory Visit: Payer: Self-pay

## 2018-08-23 ENCOUNTER — Ambulatory Visit
Admission: RE | Admit: 2018-08-23 | Discharge: 2018-08-23 | Disposition: A | Discharge status: Home / Self Care | Payer: Medicare Other | Source: Ambulatory Visit | Attending: Family Medicine | Admitting: Family Medicine

## 2018-08-23 DIAGNOSIS — R590 Localized enlarged lymph nodes: Secondary | ICD-10-CM | POA: Diagnosis not present

## 2018-08-23 DIAGNOSIS — T8612 Kidney transplant failure: Secondary | ICD-10-CM | POA: Diagnosis not present

## 2018-08-23 DIAGNOSIS — R599 Enlarged lymph nodes, unspecified: Secondary | ICD-10-CM

## 2018-08-23 DIAGNOSIS — Z992 Dependence on renal dialysis: Secondary | ICD-10-CM | POA: Diagnosis not present

## 2018-08-23 DIAGNOSIS — N186 End stage renal disease: Secondary | ICD-10-CM | POA: Diagnosis not present

## 2018-08-24 DIAGNOSIS — E1022 Type 1 diabetes mellitus with diabetic chronic kidney disease: Secondary | ICD-10-CM | POA: Diagnosis not present

## 2018-08-24 DIAGNOSIS — D631 Anemia in chronic kidney disease: Secondary | ICD-10-CM | POA: Diagnosis not present

## 2018-08-24 DIAGNOSIS — D689 Coagulation defect, unspecified: Secondary | ICD-10-CM | POA: Diagnosis not present

## 2018-08-24 DIAGNOSIS — N2581 Secondary hyperparathyroidism of renal origin: Secondary | ICD-10-CM | POA: Diagnosis not present

## 2018-08-24 DIAGNOSIS — T82818A Embolism of vascular prosthetic devices, implants and grafts, initial encounter: Secondary | ICD-10-CM | POA: Diagnosis not present

## 2018-08-24 DIAGNOSIS — N186 End stage renal disease: Secondary | ICD-10-CM | POA: Diagnosis not present

## 2018-08-27 DIAGNOSIS — N186 End stage renal disease: Secondary | ICD-10-CM | POA: Diagnosis not present

## 2018-08-27 DIAGNOSIS — N2581 Secondary hyperparathyroidism of renal origin: Secondary | ICD-10-CM | POA: Diagnosis not present

## 2018-08-27 DIAGNOSIS — D689 Coagulation defect, unspecified: Secondary | ICD-10-CM | POA: Diagnosis not present

## 2018-08-27 DIAGNOSIS — D631 Anemia in chronic kidney disease: Secondary | ICD-10-CM | POA: Diagnosis not present

## 2018-08-27 DIAGNOSIS — T82818A Embolism of vascular prosthetic devices, implants and grafts, initial encounter: Secondary | ICD-10-CM | POA: Diagnosis not present

## 2018-08-27 DIAGNOSIS — E1022 Type 1 diabetes mellitus with diabetic chronic kidney disease: Secondary | ICD-10-CM | POA: Diagnosis not present

## 2018-08-28 ENCOUNTER — Other Ambulatory Visit: Payer: Medicare Other

## 2018-08-28 ENCOUNTER — Encounter: Payer: Self-pay | Admitting: Family Medicine

## 2018-08-29 DIAGNOSIS — D631 Anemia in chronic kidney disease: Secondary | ICD-10-CM | POA: Diagnosis not present

## 2018-08-29 DIAGNOSIS — E1022 Type 1 diabetes mellitus with diabetic chronic kidney disease: Secondary | ICD-10-CM | POA: Diagnosis not present

## 2018-08-29 DIAGNOSIS — T82818A Embolism of vascular prosthetic devices, implants and grafts, initial encounter: Secondary | ICD-10-CM | POA: Diagnosis not present

## 2018-08-29 DIAGNOSIS — N2581 Secondary hyperparathyroidism of renal origin: Secondary | ICD-10-CM | POA: Diagnosis not present

## 2018-08-29 DIAGNOSIS — N186 End stage renal disease: Secondary | ICD-10-CM | POA: Diagnosis not present

## 2018-08-29 DIAGNOSIS — D689 Coagulation defect, unspecified: Secondary | ICD-10-CM | POA: Diagnosis not present

## 2018-08-31 DIAGNOSIS — T82818A Embolism of vascular prosthetic devices, implants and grafts, initial encounter: Secondary | ICD-10-CM | POA: Diagnosis not present

## 2018-08-31 DIAGNOSIS — D689 Coagulation defect, unspecified: Secondary | ICD-10-CM | POA: Diagnosis not present

## 2018-08-31 DIAGNOSIS — E1022 Type 1 diabetes mellitus with diabetic chronic kidney disease: Secondary | ICD-10-CM | POA: Diagnosis not present

## 2018-08-31 DIAGNOSIS — D631 Anemia in chronic kidney disease: Secondary | ICD-10-CM | POA: Diagnosis not present

## 2018-08-31 DIAGNOSIS — N2581 Secondary hyperparathyroidism of renal origin: Secondary | ICD-10-CM | POA: Diagnosis not present

## 2018-08-31 DIAGNOSIS — N186 End stage renal disease: Secondary | ICD-10-CM | POA: Diagnosis not present

## 2018-09-03 DIAGNOSIS — E1022 Type 1 diabetes mellitus with diabetic chronic kidney disease: Secondary | ICD-10-CM | POA: Diagnosis not present

## 2018-09-03 DIAGNOSIS — D631 Anemia in chronic kidney disease: Secondary | ICD-10-CM | POA: Diagnosis not present

## 2018-09-03 DIAGNOSIS — T82818A Embolism of vascular prosthetic devices, implants and grafts, initial encounter: Secondary | ICD-10-CM | POA: Diagnosis not present

## 2018-09-03 DIAGNOSIS — N186 End stage renal disease: Secondary | ICD-10-CM | POA: Diagnosis not present

## 2018-09-03 DIAGNOSIS — D689 Coagulation defect, unspecified: Secondary | ICD-10-CM | POA: Diagnosis not present

## 2018-09-03 DIAGNOSIS — N2581 Secondary hyperparathyroidism of renal origin: Secondary | ICD-10-CM | POA: Diagnosis not present

## 2018-09-05 DIAGNOSIS — E1022 Type 1 diabetes mellitus with diabetic chronic kidney disease: Secondary | ICD-10-CM | POA: Diagnosis not present

## 2018-09-05 DIAGNOSIS — N2581 Secondary hyperparathyroidism of renal origin: Secondary | ICD-10-CM | POA: Diagnosis not present

## 2018-09-05 DIAGNOSIS — N186 End stage renal disease: Secondary | ICD-10-CM | POA: Diagnosis not present

## 2018-09-05 DIAGNOSIS — D689 Coagulation defect, unspecified: Secondary | ICD-10-CM | POA: Diagnosis not present

## 2018-09-05 DIAGNOSIS — T82818A Embolism of vascular prosthetic devices, implants and grafts, initial encounter: Secondary | ICD-10-CM | POA: Diagnosis not present

## 2018-09-05 DIAGNOSIS — D631 Anemia in chronic kidney disease: Secondary | ICD-10-CM | POA: Diagnosis not present

## 2018-09-06 DIAGNOSIS — Z79899 Other long term (current) drug therapy: Secondary | ICD-10-CM | POA: Diagnosis not present

## 2018-09-06 DIAGNOSIS — M2550 Pain in unspecified joint: Secondary | ICD-10-CM | POA: Diagnosis not present

## 2018-09-07 DIAGNOSIS — D689 Coagulation defect, unspecified: Secondary | ICD-10-CM | POA: Diagnosis not present

## 2018-09-07 DIAGNOSIS — N186 End stage renal disease: Secondary | ICD-10-CM | POA: Diagnosis not present

## 2018-09-07 DIAGNOSIS — N2581 Secondary hyperparathyroidism of renal origin: Secondary | ICD-10-CM | POA: Diagnosis not present

## 2018-09-07 DIAGNOSIS — D631 Anemia in chronic kidney disease: Secondary | ICD-10-CM | POA: Diagnosis not present

## 2018-09-07 DIAGNOSIS — E1022 Type 1 diabetes mellitus with diabetic chronic kidney disease: Secondary | ICD-10-CM | POA: Diagnosis not present

## 2018-09-07 DIAGNOSIS — T82818A Embolism of vascular prosthetic devices, implants and grafts, initial encounter: Secondary | ICD-10-CM | POA: Diagnosis not present

## 2018-09-09 DIAGNOSIS — E1022 Type 1 diabetes mellitus with diabetic chronic kidney disease: Secondary | ICD-10-CM | POA: Diagnosis not present

## 2018-09-10 ENCOUNTER — Other Ambulatory Visit: Payer: Self-pay | Admitting: Physician Assistant

## 2018-09-10 DIAGNOSIS — E1022 Type 1 diabetes mellitus with diabetic chronic kidney disease: Secondary | ICD-10-CM | POA: Diagnosis not present

## 2018-09-10 DIAGNOSIS — N186 End stage renal disease: Secondary | ICD-10-CM | POA: Diagnosis not present

## 2018-09-10 DIAGNOSIS — D631 Anemia in chronic kidney disease: Secondary | ICD-10-CM | POA: Diagnosis not present

## 2018-09-10 DIAGNOSIS — T82818A Embolism of vascular prosthetic devices, implants and grafts, initial encounter: Secondary | ICD-10-CM | POA: Diagnosis not present

## 2018-09-10 DIAGNOSIS — N2581 Secondary hyperparathyroidism of renal origin: Secondary | ICD-10-CM | POA: Diagnosis not present

## 2018-09-10 DIAGNOSIS — D689 Coagulation defect, unspecified: Secondary | ICD-10-CM | POA: Diagnosis not present

## 2018-09-12 DIAGNOSIS — N2581 Secondary hyperparathyroidism of renal origin: Secondary | ICD-10-CM | POA: Diagnosis not present

## 2018-09-12 DIAGNOSIS — E1022 Type 1 diabetes mellitus with diabetic chronic kidney disease: Secondary | ICD-10-CM | POA: Diagnosis not present

## 2018-09-12 DIAGNOSIS — N186 End stage renal disease: Secondary | ICD-10-CM | POA: Diagnosis not present

## 2018-09-12 DIAGNOSIS — D689 Coagulation defect, unspecified: Secondary | ICD-10-CM | POA: Diagnosis not present

## 2018-09-12 DIAGNOSIS — T82818A Embolism of vascular prosthetic devices, implants and grafts, initial encounter: Secondary | ICD-10-CM | POA: Diagnosis not present

## 2018-09-12 DIAGNOSIS — D631 Anemia in chronic kidney disease: Secondary | ICD-10-CM | POA: Diagnosis not present

## 2018-09-14 DIAGNOSIS — E1022 Type 1 diabetes mellitus with diabetic chronic kidney disease: Secondary | ICD-10-CM | POA: Diagnosis not present

## 2018-09-14 DIAGNOSIS — N2581 Secondary hyperparathyroidism of renal origin: Secondary | ICD-10-CM | POA: Diagnosis not present

## 2018-09-14 DIAGNOSIS — N186 End stage renal disease: Secondary | ICD-10-CM | POA: Diagnosis not present

## 2018-09-14 DIAGNOSIS — D689 Coagulation defect, unspecified: Secondary | ICD-10-CM | POA: Diagnosis not present

## 2018-09-14 DIAGNOSIS — D631 Anemia in chronic kidney disease: Secondary | ICD-10-CM | POA: Diagnosis not present

## 2018-09-14 DIAGNOSIS — T82818A Embolism of vascular prosthetic devices, implants and grafts, initial encounter: Secondary | ICD-10-CM | POA: Diagnosis not present

## 2018-09-17 DIAGNOSIS — T82818A Embolism of vascular prosthetic devices, implants and grafts, initial encounter: Secondary | ICD-10-CM | POA: Diagnosis not present

## 2018-09-17 DIAGNOSIS — N2581 Secondary hyperparathyroidism of renal origin: Secondary | ICD-10-CM | POA: Diagnosis not present

## 2018-09-17 DIAGNOSIS — D631 Anemia in chronic kidney disease: Secondary | ICD-10-CM | POA: Diagnosis not present

## 2018-09-17 DIAGNOSIS — D689 Coagulation defect, unspecified: Secondary | ICD-10-CM | POA: Diagnosis not present

## 2018-09-17 DIAGNOSIS — E1022 Type 1 diabetes mellitus with diabetic chronic kidney disease: Secondary | ICD-10-CM | POA: Diagnosis not present

## 2018-09-17 DIAGNOSIS — N186 End stage renal disease: Secondary | ICD-10-CM | POA: Diagnosis not present

## 2018-09-19 DIAGNOSIS — D689 Coagulation defect, unspecified: Secondary | ICD-10-CM | POA: Diagnosis not present

## 2018-09-19 DIAGNOSIS — N2581 Secondary hyperparathyroidism of renal origin: Secondary | ICD-10-CM | POA: Diagnosis not present

## 2018-09-19 DIAGNOSIS — E1022 Type 1 diabetes mellitus with diabetic chronic kidney disease: Secondary | ICD-10-CM | POA: Diagnosis not present

## 2018-09-19 DIAGNOSIS — T82818A Embolism of vascular prosthetic devices, implants and grafts, initial encounter: Secondary | ICD-10-CM | POA: Diagnosis not present

## 2018-09-19 DIAGNOSIS — N186 End stage renal disease: Secondary | ICD-10-CM | POA: Diagnosis not present

## 2018-09-19 DIAGNOSIS — D631 Anemia in chronic kidney disease: Secondary | ICD-10-CM | POA: Diagnosis not present

## 2018-09-20 DIAGNOSIS — M1711 Unilateral primary osteoarthritis, right knee: Secondary | ICD-10-CM | POA: Diagnosis not present

## 2018-09-21 ENCOUNTER — Telehealth: Payer: Self-pay

## 2018-09-21 DIAGNOSIS — N2581 Secondary hyperparathyroidism of renal origin: Secondary | ICD-10-CM | POA: Diagnosis not present

## 2018-09-21 DIAGNOSIS — T82818A Embolism of vascular prosthetic devices, implants and grafts, initial encounter: Secondary | ICD-10-CM | POA: Diagnosis not present

## 2018-09-21 DIAGNOSIS — D631 Anemia in chronic kidney disease: Secondary | ICD-10-CM | POA: Diagnosis not present

## 2018-09-21 DIAGNOSIS — D689 Coagulation defect, unspecified: Secondary | ICD-10-CM | POA: Diagnosis not present

## 2018-09-21 DIAGNOSIS — N186 End stage renal disease: Secondary | ICD-10-CM | POA: Diagnosis not present

## 2018-09-21 DIAGNOSIS — E1022 Type 1 diabetes mellitus with diabetic chronic kidney disease: Secondary | ICD-10-CM | POA: Diagnosis not present

## 2018-09-21 NOTE — Telephone Encounter (Signed)
Becky at [facility] in [place] called to say she needed to speak with someone regarding pt due to poorly functioning catheter.   Called [place] and advised her that she would need to contact [facility] or [facility] given the length of time that the patient has had the catheter to see how they would like to proceed.   York Cerise, CMA

## 2018-09-23 DIAGNOSIS — T8612 Kidney transplant failure: Secondary | ICD-10-CM | POA: Diagnosis not present

## 2018-09-23 DIAGNOSIS — N186 End stage renal disease: Secondary | ICD-10-CM | POA: Diagnosis not present

## 2018-09-23 DIAGNOSIS — Z992 Dependence on renal dialysis: Secondary | ICD-10-CM | POA: Diagnosis not present

## 2018-09-24 DIAGNOSIS — L299 Pruritus, unspecified: Secondary | ICD-10-CM | POA: Diagnosis not present

## 2018-09-24 DIAGNOSIS — N2581 Secondary hyperparathyroidism of renal origin: Secondary | ICD-10-CM | POA: Diagnosis not present

## 2018-09-24 DIAGNOSIS — T82818A Embolism of vascular prosthetic devices, implants and grafts, initial encounter: Secondary | ICD-10-CM | POA: Diagnosis not present

## 2018-09-24 DIAGNOSIS — N186 End stage renal disease: Secondary | ICD-10-CM | POA: Diagnosis not present

## 2018-09-24 DIAGNOSIS — D689 Coagulation defect, unspecified: Secondary | ICD-10-CM | POA: Diagnosis not present

## 2018-09-24 DIAGNOSIS — R197 Diarrhea, unspecified: Secondary | ICD-10-CM | POA: Diagnosis not present

## 2018-09-24 DIAGNOSIS — D631 Anemia in chronic kidney disease: Secondary | ICD-10-CM | POA: Diagnosis not present

## 2018-09-24 DIAGNOSIS — E1022 Type 1 diabetes mellitus with diabetic chronic kidney disease: Secondary | ICD-10-CM | POA: Diagnosis not present

## 2018-09-25 DIAGNOSIS — E103532 Type 1 diabetes mellitus with proliferative diabetic retinopathy with traction retinal detachment not involving the macula, left eye: Secondary | ICD-10-CM | POA: Diagnosis not present

## 2018-09-25 DIAGNOSIS — E103551 Type 1 diabetes mellitus with stable proliferative diabetic retinopathy, right eye: Secondary | ICD-10-CM | POA: Diagnosis not present

## 2018-09-25 DIAGNOSIS — H5213 Myopia, bilateral: Secondary | ICD-10-CM | POA: Diagnosis not present

## 2018-09-25 DIAGNOSIS — H524 Presbyopia: Secondary | ICD-10-CM | POA: Diagnosis not present

## 2018-09-26 DIAGNOSIS — N186 End stage renal disease: Secondary | ICD-10-CM | POA: Diagnosis not present

## 2018-09-26 DIAGNOSIS — T82898A Other specified complication of vascular prosthetic devices, implants and grafts, initial encounter: Secondary | ICD-10-CM | POA: Diagnosis not present

## 2018-09-26 DIAGNOSIS — Z992 Dependence on renal dialysis: Secondary | ICD-10-CM | POA: Diagnosis not present

## 2018-09-27 DIAGNOSIS — L299 Pruritus, unspecified: Secondary | ICD-10-CM | POA: Diagnosis not present

## 2018-09-27 DIAGNOSIS — R197 Diarrhea, unspecified: Secondary | ICD-10-CM | POA: Diagnosis not present

## 2018-09-27 DIAGNOSIS — D689 Coagulation defect, unspecified: Secondary | ICD-10-CM | POA: Diagnosis not present

## 2018-09-27 DIAGNOSIS — T82818A Embolism of vascular prosthetic devices, implants and grafts, initial encounter: Secondary | ICD-10-CM | POA: Diagnosis not present

## 2018-09-27 DIAGNOSIS — E1022 Type 1 diabetes mellitus with diabetic chronic kidney disease: Secondary | ICD-10-CM | POA: Diagnosis not present

## 2018-09-27 DIAGNOSIS — N186 End stage renal disease: Secondary | ICD-10-CM | POA: Diagnosis not present

## 2018-09-27 DIAGNOSIS — D631 Anemia in chronic kidney disease: Secondary | ICD-10-CM | POA: Diagnosis not present

## 2018-09-27 DIAGNOSIS — N2581 Secondary hyperparathyroidism of renal origin: Secondary | ICD-10-CM | POA: Diagnosis not present

## 2018-09-28 DIAGNOSIS — N186 End stage renal disease: Secondary | ICD-10-CM | POA: Diagnosis not present

## 2018-09-28 DIAGNOSIS — T82818A Embolism of vascular prosthetic devices, implants and grafts, initial encounter: Secondary | ICD-10-CM | POA: Diagnosis not present

## 2018-09-28 DIAGNOSIS — D631 Anemia in chronic kidney disease: Secondary | ICD-10-CM | POA: Diagnosis not present

## 2018-09-28 DIAGNOSIS — D689 Coagulation defect, unspecified: Secondary | ICD-10-CM | POA: Diagnosis not present

## 2018-09-28 DIAGNOSIS — E1022 Type 1 diabetes mellitus with diabetic chronic kidney disease: Secondary | ICD-10-CM | POA: Diagnosis not present

## 2018-09-28 DIAGNOSIS — L299 Pruritus, unspecified: Secondary | ICD-10-CM | POA: Diagnosis not present

## 2018-09-28 DIAGNOSIS — N2581 Secondary hyperparathyroidism of renal origin: Secondary | ICD-10-CM | POA: Diagnosis not present

## 2018-09-28 DIAGNOSIS — R197 Diarrhea, unspecified: Secondary | ICD-10-CM | POA: Diagnosis not present

## 2018-10-01 DIAGNOSIS — R197 Diarrhea, unspecified: Secondary | ICD-10-CM | POA: Diagnosis not present

## 2018-10-01 DIAGNOSIS — D631 Anemia in chronic kidney disease: Secondary | ICD-10-CM | POA: Diagnosis not present

## 2018-10-01 DIAGNOSIS — N186 End stage renal disease: Secondary | ICD-10-CM | POA: Diagnosis not present

## 2018-10-01 DIAGNOSIS — L299 Pruritus, unspecified: Secondary | ICD-10-CM | POA: Diagnosis not present

## 2018-10-01 DIAGNOSIS — D689 Coagulation defect, unspecified: Secondary | ICD-10-CM | POA: Diagnosis not present

## 2018-10-01 DIAGNOSIS — N2581 Secondary hyperparathyroidism of renal origin: Secondary | ICD-10-CM | POA: Diagnosis not present

## 2018-10-01 DIAGNOSIS — T82818A Embolism of vascular prosthetic devices, implants and grafts, initial encounter: Secondary | ICD-10-CM | POA: Diagnosis not present

## 2018-10-01 DIAGNOSIS — E1022 Type 1 diabetes mellitus with diabetic chronic kidney disease: Secondary | ICD-10-CM | POA: Diagnosis not present

## 2018-10-03 DIAGNOSIS — R197 Diarrhea, unspecified: Secondary | ICD-10-CM | POA: Diagnosis not present

## 2018-10-03 DIAGNOSIS — D689 Coagulation defect, unspecified: Secondary | ICD-10-CM | POA: Diagnosis not present

## 2018-10-03 DIAGNOSIS — E1022 Type 1 diabetes mellitus with diabetic chronic kidney disease: Secondary | ICD-10-CM | POA: Diagnosis not present

## 2018-10-03 DIAGNOSIS — N186 End stage renal disease: Secondary | ICD-10-CM | POA: Diagnosis not present

## 2018-10-03 DIAGNOSIS — N2581 Secondary hyperparathyroidism of renal origin: Secondary | ICD-10-CM | POA: Diagnosis not present

## 2018-10-03 DIAGNOSIS — T82818A Embolism of vascular prosthetic devices, implants and grafts, initial encounter: Secondary | ICD-10-CM | POA: Diagnosis not present

## 2018-10-03 DIAGNOSIS — L299 Pruritus, unspecified: Secondary | ICD-10-CM | POA: Diagnosis not present

## 2018-10-03 DIAGNOSIS — D631 Anemia in chronic kidney disease: Secondary | ICD-10-CM | POA: Diagnosis not present

## 2018-10-05 DIAGNOSIS — D689 Coagulation defect, unspecified: Secondary | ICD-10-CM | POA: Diagnosis not present

## 2018-10-05 DIAGNOSIS — L299 Pruritus, unspecified: Secondary | ICD-10-CM | POA: Diagnosis not present

## 2018-10-05 DIAGNOSIS — D631 Anemia in chronic kidney disease: Secondary | ICD-10-CM | POA: Diagnosis not present

## 2018-10-05 DIAGNOSIS — E1022 Type 1 diabetes mellitus with diabetic chronic kidney disease: Secondary | ICD-10-CM | POA: Diagnosis not present

## 2018-10-05 DIAGNOSIS — T82818A Embolism of vascular prosthetic devices, implants and grafts, initial encounter: Secondary | ICD-10-CM | POA: Diagnosis not present

## 2018-10-05 DIAGNOSIS — N2581 Secondary hyperparathyroidism of renal origin: Secondary | ICD-10-CM | POA: Diagnosis not present

## 2018-10-05 DIAGNOSIS — R197 Diarrhea, unspecified: Secondary | ICD-10-CM | POA: Diagnosis not present

## 2018-10-05 DIAGNOSIS — N186 End stage renal disease: Secondary | ICD-10-CM | POA: Diagnosis not present

## 2018-10-08 ENCOUNTER — Other Ambulatory Visit: Payer: Self-pay

## 2018-10-08 DIAGNOSIS — T82818A Embolism of vascular prosthetic devices, implants and grafts, initial encounter: Secondary | ICD-10-CM | POA: Diagnosis not present

## 2018-10-08 DIAGNOSIS — L299 Pruritus, unspecified: Secondary | ICD-10-CM | POA: Diagnosis not present

## 2018-10-08 DIAGNOSIS — E1022 Type 1 diabetes mellitus with diabetic chronic kidney disease: Secondary | ICD-10-CM | POA: Diagnosis not present

## 2018-10-08 DIAGNOSIS — D689 Coagulation defect, unspecified: Secondary | ICD-10-CM | POA: Diagnosis not present

## 2018-10-08 DIAGNOSIS — N2581 Secondary hyperparathyroidism of renal origin: Secondary | ICD-10-CM | POA: Diagnosis not present

## 2018-10-08 DIAGNOSIS — N186 End stage renal disease: Secondary | ICD-10-CM

## 2018-10-08 DIAGNOSIS — D631 Anemia in chronic kidney disease: Secondary | ICD-10-CM | POA: Diagnosis not present

## 2018-10-08 DIAGNOSIS — R197 Diarrhea, unspecified: Secondary | ICD-10-CM | POA: Diagnosis not present

## 2018-10-09 DIAGNOSIS — E108 Type 1 diabetes mellitus with unspecified complications: Secondary | ICD-10-CM | POA: Diagnosis not present

## 2018-10-09 DIAGNOSIS — T8249XA Other complication of vascular dialysis catheter, initial encounter: Secondary | ICD-10-CM | POA: Diagnosis not present

## 2018-10-09 DIAGNOSIS — Z992 Dependence on renal dialysis: Secondary | ICD-10-CM | POA: Diagnosis not present

## 2018-10-09 DIAGNOSIS — E1040 Type 1 diabetes mellitus with diabetic neuropathy, unspecified: Secondary | ICD-10-CM | POA: Diagnosis not present

## 2018-10-09 DIAGNOSIS — N186 End stage renal disease: Secondary | ICD-10-CM | POA: Diagnosis not present

## 2018-10-09 DIAGNOSIS — Z794 Long term (current) use of insulin: Secondary | ICD-10-CM | POA: Diagnosis not present

## 2018-10-09 DIAGNOSIS — E1059 Type 1 diabetes mellitus with other circulatory complications: Secondary | ICD-10-CM | POA: Diagnosis not present

## 2018-10-10 DIAGNOSIS — D689 Coagulation defect, unspecified: Secondary | ICD-10-CM | POA: Diagnosis not present

## 2018-10-10 DIAGNOSIS — L299 Pruritus, unspecified: Secondary | ICD-10-CM | POA: Diagnosis not present

## 2018-10-10 DIAGNOSIS — D631 Anemia in chronic kidney disease: Secondary | ICD-10-CM | POA: Diagnosis not present

## 2018-10-10 DIAGNOSIS — T82818A Embolism of vascular prosthetic devices, implants and grafts, initial encounter: Secondary | ICD-10-CM | POA: Diagnosis not present

## 2018-10-10 DIAGNOSIS — N186 End stage renal disease: Secondary | ICD-10-CM | POA: Diagnosis not present

## 2018-10-10 DIAGNOSIS — R197 Diarrhea, unspecified: Secondary | ICD-10-CM | POA: Diagnosis not present

## 2018-10-10 DIAGNOSIS — N2581 Secondary hyperparathyroidism of renal origin: Secondary | ICD-10-CM | POA: Diagnosis not present

## 2018-10-10 DIAGNOSIS — E1022 Type 1 diabetes mellitus with diabetic chronic kidney disease: Secondary | ICD-10-CM | POA: Diagnosis not present

## 2018-10-11 ENCOUNTER — Ambulatory Visit (HOSPITAL_COMMUNITY)
Admission: RE | Admit: 2018-10-11 | Discharge: 2018-10-11 | Disposition: A | Discharge status: Home / Self Care | Payer: Medicare Other | Source: Ambulatory Visit | Attending: Family | Admitting: Family

## 2018-10-11 ENCOUNTER — Other Ambulatory Visit: Payer: Self-pay

## 2018-10-11 ENCOUNTER — Encounter: Payer: Self-pay | Admitting: *Deleted

## 2018-10-11 ENCOUNTER — Other Ambulatory Visit: Payer: Self-pay | Admitting: *Deleted

## 2018-10-11 ENCOUNTER — Encounter: Payer: Self-pay | Admitting: Vascular Surgery

## 2018-10-11 ENCOUNTER — Ambulatory Visit: Payer: Medicare Other | Admitting: Vascular Surgery

## 2018-10-11 ENCOUNTER — Ambulatory Visit (INDEPENDENT_AMBULATORY_CARE_PROVIDER_SITE_OTHER)
Admission: RE | Admit: 2018-10-11 | Discharge: 2018-10-11 | Disposition: A | Discharge status: Home / Self Care | Payer: Medicare Other | Source: Ambulatory Visit | Attending: Vascular Surgery | Admitting: Vascular Surgery

## 2018-10-11 VITALS — BP 106/57 | HR 84 | Temp 97.1°F | Resp 20 | Ht 68.0 in | Wt 285.0 lb

## 2018-10-11 DIAGNOSIS — N186 End stage renal disease: Secondary | ICD-10-CM | POA: Diagnosis not present

## 2018-10-11 DIAGNOSIS — Z992 Dependence on renal dialysis: Secondary | ICD-10-CM

## 2018-10-11 DIAGNOSIS — M1711 Unilateral primary osteoarthritis, right knee: Secondary | ICD-10-CM | POA: Diagnosis not present

## 2018-10-11 NOTE — Progress Notes (Signed)
Referring Physician: Dr Augustin Coupe  Patient name: Jovante Hammitt MRN: [id number] DOB: [birth year] Sex: male  REASON FOR CONSULT: Dialysis access  HPI: Price Lachapelle is a 53 y.o. male, who has had multiple prior access procedures.  He has been on dialysis since 1991 intermittently.  He does have a history of a failed transplant in the past.  Most recently he had thrombectomy of a left brachiocephalic AV fistula by Dr. Doren Custard.  Dr. Doren Custard placed a catheter simultaneously due to poor outflow of the fistula.  The fistula currently is nonfunctional.  Dr. Doren Custard suspicion was that he had a central stent that had occluded.  The patient has had prior right thigh graft which was removed for infection.  He is previously had a left forearm AV graft a left radiocephalic AV fistula in the left brachiocephalic AV fistula all of which have failed.  He also had what sounds like a right brachiocephalic fistula and developed swelling in the right upper extremity consistent with venous hypertension in the past.  He does have a history of contrast reaction with skin slough.  He states that this is significantly reduced with pretreatment with Solu-Medrol and Benadryl.  Other medical problems include coronary artery disease he is on aspirin and Effient.  He has had a prior left below-knee amputation and would like to avoid any AV graft in his left leg.  Is currently dialyzing via right sided catheter.  He is right-handed.  His dialysis today is Monday Wednesday Friday.  Past Medical History:  Diagnosis Date   Anal fissure    Anemia of chronic disease    Arthritis    "hands, right knee" (03/19/2014) no cartlidge in right knee   CAD (coronary artery disease)    a. Abnl nuc ->LHC (11/15):  mid to dist Dx 80%, mid RCA 99% (functional CTO), dist RCA 80% >> PCI: balloon angioplasty to mid RCA (could not deliver stent) - staged PCI Rotoblator atherectomy/DES to [facility]. b. NSTEMI 12/2015 s/p DES to diagonal.   Cholecystitis    a.  08/27/2011   Chronic diastolic CHF (congestive heart failure) ([place])    a. Echo 3/13:  EF 55-60%;  b. Echo (11/15):  Mild LVH, EF 60-65%, no RWMA, Gr 1 DD, MAC, mild LAE, normal RVF, mild RAE;  c.  Echo 5/16:  severe LVH, EF 55-60%, no RWMA, Gr 1 DD, MAC, mild LAE   Claustrophobia    when things get around his face.    Complication of anesthesia    Difficult intubation    only once in 2015 at [facility] haven't had a problem since then   Diverticulosis    Esophageal dysmotility    ESRD (end stage renal disease) ([place])    a. 1995 s/p cadaveric transplant w/ susbequent failure after 18 yrs;  b.Dialysis initiated 07/2011 [facility] M-W-F   Fibromyalgia    Gastroparesis    GERD (gastroesophageal reflux disease)    Gout    PMH   History of blood transfusion    HLD (hyperlipidemia)    Hypertension    Hypothyroidism    Inguinal lymphadenopathy    a. bilateral - s/p biopsy 07/2011   Insulin dependent diabetes mellitus (HCC)    Type I   Monilial esophagitis (HCC)    Morbid obesity (HCC)    OSA (obstructive sleep apnea)    adjustable bed   PAD (peripheral artery disease) ([place])    a. s/p L BKA.   Pericardial effusion  a.  Small by CT 08/22/11;  b.  Large by CT 08/27/11. c. Not seen on 11/2015 echo.   Pneumonia ~ 2007   PONV (postoperative nausea and vomiting)    vomited once after a procedure.   T1DM (type 1 diabetes mellitus) ([place])    Tracheobronchomalacia    Past Surgical History:  Procedure Laterality Date   A/V FISTULAGRAM Left 07/26/2016   Procedure: A/V Fistulagram;  Surgeon: Waynetta Sandy, MD;  Location: [name] [facility];  Service: Cardiovascular;  Laterality: Left;   A/V FISTULAGRAM N/A 07/11/2017   Procedure: A/V FISTULAGRAM - Left AV;  Surgeon: Waynetta Sandy, MD;  Location: [place] [facility];  Service: Cardiovascular;  Laterality: N/A;   AMPUTATION OF REPLICATED TOES Right    ANGIOPLASTY  04/09/2014   RCA    APPENDECTOMY   ~ 2006   ARTERIOVENOUS GRAFT PLACEMENT Left 1993?   forearm   ARTERIOVENOUS GRAFT PLACEMENT Right 1993?   leg   ARTERIOVENOUS GRAFT PLACEMENT Left    Thigh   Arteriovenous Graft Removed Left    Thigh   AV FISTULA PLACEMENT  07/29/2011   Procedure: ARTERIOVENOUS (AV) FISTULA CREATION;  Surgeon: Mal Misty, MD;  Location: [place];  Service: Vascular;  Laterality: Right;  Brachial cephalic   AV FISTULA PLACEMENT Left 01/20/2015   Procedure: LIGATION OF RIGHT ARTERIOVENOUS FISTULA WITH EXCISION OF ANEURYSM;  Surgeon: Angelia Mould, MD;  Location: [place];  Service: Vascular;  Laterality: Left;   AV FISTULA PLACEMENT Left 04/30/2015   Procedure: CREATION OF LEFT ARM BRACHIO-CEPHALIC ARTERIOVENOUS (AV) FISTULA ;  Surgeon: Angelia Mould, MD;  Location: [place];  Service: Vascular;  Laterality: Left;   AV Graft PLACEMENT Left 1993?   "attempted one in my wrist; didn't take"   BELOW KNEE LEG AMPUTATION Left 2010   CARDIAC CATHETERIZATION  03/19/2014   CARDIAC CATHETERIZATION  03/19/2014   Procedure: CORONARY BALLOON ANGIOPLASTY;  Surgeon: Burnell Blanks, MD;  Location: [facility] CATH LAB;  Service: Cardiovascular;;   CARDIAC CATHETERIZATION N/A 01/01/2016   Procedure: Left Heart Cath and Coronary Angiography;  Surgeon: Troy Sine, MD;  Location: [place] [facility];  Service: Cardiovascular;  Laterality: N/A;   CARDIAC CATHETERIZATION N/A 01/01/2016   Procedure: Coronary Stent Intervention;  Surgeon: Troy Sine, MD;  Location: [place] [facility];  Service: Cardiovascular;  Laterality: N/A;   CARPAL TUNNEL RELEASE Bilateral after 2005   CATARACT EXTRACTION W/ INTRAOCULAR LENS  IMPLANT, BILATERAL Bilateral 1990's   COLONOSCOPY  08/29/2011   Procedure: COLONOSCOPY;  Surgeon: Jerene Bears, MD;  Location: [place];  Service: Gastroenterology;  Laterality: N/A;   COLONOSCOPY WITH PROPOFOL N/A 04/26/2016   Procedure: COLONOSCOPY WITH PROPOFOL;  Surgeon: Jerene Bears, MD;   Location: [facility] ENDOSCOPY;  Service: Gastroenterology;  Laterality: N/A;   CORONARY ANGIOPLASTY WITH STENT PLACEMENT  04/09/2014       ptca/des mid lad    ESOPHAGOGASTRODUODENOSCOPY N/A 12/25/2015   Procedure: ESOPHAGOGASTRODUODENOSCOPY (EGD);  Surgeon: Mauri Pole, MD;  Location: [facility] ENDOSCOPY;  Service: Endoscopy;  Laterality: N/A;  bedside   ESOPHAGOGASTRODUODENOSCOPY (EGD) WITH PROPOFOL N/A 04/26/2016   Procedure: ESOPHAGOGASTRODUODENOSCOPY (EGD) WITH PROPOFOL;  Surgeon: Jerene Bears, MD;  Location: [facility] ENDOSCOPY;  Service: Gastroenterology;  Laterality: N/A;   EYE SURGERY Bilateral    cataract   FEMORAL ARTERY REPAIR  04/09/2014   ANGIOSEAL   INSERTION OF DIALYSIS CATHETER  08/01/2011   Procedure: INSERTION OF DIALYSIS CATHETER;  Surgeon: Rosetta Posner,  MD;  Location: [place] [facility];  Service: Vascular;  Laterality: Right;  insertion of dialysis catheter on right internal jugular vein   INSERTION OF DIALYSIS CATHETER Right 01/20/2015   Procedure: INSERTION OF DIALYSIS CATHETER - RIGHT INTERNAL JUGULAR ;  Surgeon: Angelia Mould, MD;  Location: [place];  Service: Vascular;  Laterality: Right;   INSERTION OF DIALYSIS CATHETER Right 08/02/2018   Procedure: Insertion Of Dialysis Catheter;  Surgeon: Angelia Mould, MD;  Location: [place];  Service: Vascular;  Laterality: Right;   KIDNEY TRANSPLANT  04/25/1993   LEFT HEART CATHETERIZATION WITH CORONARY ANGIOGRAM N/A 03/19/2014   Procedure: LEFT HEART CATHETERIZATION WITH CORONARY ANGIOGRAM;  Surgeon: Burnell Blanks, MD;  Location[facility] C[facility] LAB;  Service: Cardiovascular;  Laterality: N/A;   LYMPH NODE BIOPSY  08/10/2011   Procedure: LYMPH NODE BIOPSY;  Surgeon: Merrie Roof, MD;  Location: [place];  Service: General;  Laterality: Left;  left groin lymph biopsy   NEUROPLASTY / TRANSPOSITION ULNAR NERVE AT ELBOW Left after 2005   PATCH ANGIOPLASTY Left 08/09/2016   Procedure: LEFT  ARTERIOVENOUS FISTULA PATCH ANGIOPLASTY;  Surgeon:  Waynetta Sandy, MD;  Location: [place];  Service: Vascular;  Laterality: Left;   PERCUTANEOUS CORONARY ROTOBLATOR INTERVENTION (PCI-R) N/A 04/09/2014   Procedure: PERCUTANEOUS CORONARY ROTOBLATOR INTERVENTION (PCI-R);  Surgeon: Burnell Blanks, MD;  Location: [facility] CATH LAB;  Service: Cardiovascular;  Laterality: N/A;   PERIPHERAL VASCULAR BALLOON ANGIOPLASTY  07/11/2017   Procedure: PERIPHERAL VASCULAR BALLOON ANGIOPLASTY;  Surgeon: Waynetta Sandy, MD;  Location: [place] [facility];  Service: Cardiovascular;;  left arm AV fistula   PERIPHERAL VASCULAR CATHETERIZATION N/A 12/25/2014   Procedure: Upper Extremity Venography;  Surgeon: Conrad Birch Run, MD;  Location: [place] [facility];  Service: Cardiovascular;  Laterality: N/A;   PERIPHERAL VASCULAR CATHETERIZATION Left 02/24/2016   Procedure: Fistulagram;  Surgeon: Waynetta Sandy, MD;  Location: [place] [facility];  Service: Cardiovascular;  Laterality: Left;   PERIPHERAL VASCULAR CATHETERIZATION Left 02/24/2016   Procedure: Peripheral Vascular Intervention;  Surgeon: Waynetta Sandy, MD;  Location: [place] [facility];  Service: Cardiovascular;  Laterality: Left;  AV FISTULA   PERITONEAL CATHETER INSERTION     PERITONEAL CATHETER REMOVAL     REVISON OF ARTERIOVENOUS FISTULA Right 10/09/735   Procedure: PLICATION / REVISION OF ARTERIOVENOUS FISTULA;  Surgeon: Angelia Mould, MD;  Location: [place];  Service: Vascular;  Laterality: Right;   REVISON OF ARTERIOVENOUS FISTULA Left 09/01/2015   Procedure: SUPERFICIALIZATION OF LEFT ARM BRACHIOCEPHALIC ARTERIOVENOUS FISTULA;  Surgeon: Angelia Mould, MD;  Location: [place];  Service: Vascular;  Laterality: Left;   REVISON OF ARTERIOVENOUS FISTULA Left 08/09/2016   Procedure: REVISION OF ARTERIAL ANASTOMOSIS OF ARTERIOVENOUS FISTULA;  Surgeon: Waynetta Sandy, MD;  Location: [place];  Service: Vascular;  Laterality: Left;   SHOULDER ARTHROSCOPY  Left ~ 2006   frozen   THROMBECTOMY W/ EMBOLECTOMY Left 08/02/2018   Procedure: THROMBECTOMY ARTERIOVENOUS FISTULA;  Surgeon: Angelia Mould, MD;  Location: [place];  Service: Vascular;  Laterality: Left;   TOE AMPUTATION Bilateral after 1062   UMBILICAL HERNIA REPAIR  1990's   VENOGRAM Right 07/28/2011   Procedure: VENOGRAM;  Surgeon: Conrad Woodland, MD;  Location: [facility] CATH LAB;  Service: Cardiovascular;  Laterality: Right;   VITRECTOMY Bilateral     Family History  Problem Relation Age of Onset   Colon polyps Father    Diabetes Father    Hypertension Father  Hypertension Mother    Diabetes Mother    Hyperlipidemia Mother    Breast cancer Maternal Aunt    Bone cancer Maternal Aunt    Liver cancer Maternal Aunt    Malignant hyperthermia Neg Hx    Heart attack Neg Hx    Stroke Neg Hx    Colon cancer Neg Hx    Stomach cancer Neg Hx    Pancreatic cancer Neg Hx    Esophageal cancer Neg Hx     SOCIAL HISTORY: Social History   Socioeconomic History   Marital status: Married    Spouse name: Not on file   Number of children: Not on file   Years of education: Not on file   Highest education level: Not on file  Occupational History   Occupation: Disabled  Scientist, product/process development strain: Not on file   Food insecurity    Worry: Not on file    Inability: Not on file   Transportation needs    Medical: Not on file    Non-medical: Not on file  Tobacco Use   Smoking status: Never Smoker   Smokeless tobacco: Never Used  Substance and Sexual Activity   Alcohol use: Yes    Comment: occasional   Drug use: No   Sexual activity: Not on file  Lifestyle   Physical activity    Days per week: Not on file    Minutes per session: Not on file   Stress: Not on file  Relationships   Social connections    Talks on phone: Not on file    Gets together: Not on file    Attends religious service: Not on file    Active member of club or  organization: Not on file    Attends meetings of clubs or organizations: Not on file    Relationship status: Not on file   Intimate partner violence    Fear of current or ex partner: Not on file    Emotionally abused: Not on file    Physically abused: Not on file    Forced sexual activity: Not on file  Other Topics Concern   Not on file  Social History Narrative   Lives @ home with his wife in [place].     Allergies  Allergen Reactions   Iodinated Diagnostic Agents Itching, Rash and Other (See Comments)    Systemic itching and transient rash appearance within hours of receiving contrast     Prednisone Other (See Comments)    Increases sugar levels   Adhesive [Tape] Itching and Other (See Comments)    Blisters on arm from adhesive tape at dialysis    Amoxicillin Itching, Rash and Other (See Comments)    Has patient had a PCN reaction causing immediate rash, facial/tongue/throat swelling, SOB or lightheadedness with hypotension: Yes Has patient had a PCN reaction causing severe rash involving mucus membranes or skin necrosis: No Has patient had a PCN reaction that required hospitalization No Has patient had a PCN reaction occurring within the last 10 years: No If all of the above answers are "NO", then may proceed with Cephalosporin use.   Clindamycin/Lincomycin Hives and Itching   Enalapril Rash and Cough    Cough   Mobic [Meloxicam] Hives and Rash   Morphine Hives, Nausea Only and Rash   Vancomycin Hives   Brilinta [Ticagrelor] Nausea And Vomiting   Ciprofloxacin Itching and Rash   Clopidogrel Rash   Codeine Hives, Itching and Rash   Doxycycline Rash  Hydrocodone Rash   Levofloxacin Hives and Itching   Oxycodone Rash   Rocephin [Ceftriaxone] Itching    Current Outpatient Medications  Medication Sig Dispense Refill   allopurinol (ZYLOPRIM) 100 MG tablet Take 2 tablets (200 mg total) by mouth daily. 180 tablet 3   aspirin EC 81 MG tablet Take  81 mg by mouth at bedtime.     atorvastatin (LIPITOR) 10 MG tablet Take 10 mg by mouth daily.      calcium acetate (PHOSLO) 667 MG capsule Take 667-1,334 mg by mouth See admin instructions. Take 1,334 mg by mouth three times a day with meals and 667 mg with each snack     cetirizine (ZYRTEC) 10 MG tablet Take 10 mg by mouth daily.     diphenhydrAMINE (BENADRYL) 50 MG tablet Take 50 mg by mouth every 6 (six) hours as needed (for itching or allergic reactions).     docusate sodium (COLACE) 100 MG capsule Take 100 mg by mouth 2 (two) times daily.      famotidine (PEPCID) 20 MG tablet Take 20 mg by mouth every other day.     fluticasone (FLONASE) 50 MCG/ACT nasal spray Place 2 sprays into both nostrils daily as needed for allergies.      gabapentin (NEURONTIN) 100 MG capsule Take 1 capsule (100 mg total) by mouth at bedtime. 30 capsule 0   GLUCAGON EMERGENCY 1 MG injection Inject 1 mg into the muscle daily as needed (low blood sugar).      hydrocortisone 1 % ointment Apply 1 application topically 2 (two) times daily. 110 g 0   HYDROmorphone (DILAUDID) 2 MG tablet Take 2 mg by mouth 2 (two) times daily.      insulin lispro (HUMALOG) 100 UNIT/ML injection Per insulin pump     levothyroxine (SYNTHROID, LEVOTHROID) 112 MCG tablet Take 112 mcg by mouth every evening. 2 hours after dinner     metoCLOPramide (REGLAN) 10 MG tablet Take 10 mg by mouth 4 (four) times daily.      midodrine (PROAMATINE) 10 MG tablet Take 1 tablet (10 mg total) by mouth 3 (three) times daily. Takes 1 tab in mornings of dialysis and takes 2 tabs 30 mins before starting dialysis (m,w,f) (Patient taking differently: Take 10 mg by mouth See admin instructions. Take 10 mg by mouth on the mornings of dialysis (Mon/Wed/Fri) and 10 mg thirty minutes prior to starting dialysis if BP is low (Mon/Wed/Fri))     multivitamin (RENA-VIT) TABS tablet Take 1 tablet by mouth daily.     nitroGLYCERIN (NITROSTAT) 0.4 MG SL tablet Place  1 tablet (0.4 mg total) under the tongue every 5 (five) minutes as needed for chest pain. 25 tablet 4   Nutritional Supplements (FEEDING SUPPLEMENT, NEPRO CARB STEADY,) LIQD Take 237 mLs by mouth daily. (Patient taking differently: Take 237 mLs by mouth every Monday, Wednesday, and Friday with hemodialysis. ) 30 Can 0   Polyethyl Glycol-Propyl Glycol (SYSTANE) 0.4-0.3 % SOLN Place 1 drop into both eyes at bedtime.      polyethylene glycol (MIRALAX / GLYCOLAX) packet Take 17 g by mouth daily. (Patient taking differently: Take 17 g by mouth daily as needed for mild constipation. ) 14 each 0   prasugrel (EFFIENT) 10 MG TABS tablet TAKE 1 TABLET BY MOUTH EVERY DAY 90 tablet 0   promethazine (PHENERGAN) 25 MG tablet Take 25 mg by mouth every 6 (six) hours as needed for nausea or vomiting.     rOPINIRole (REQUIP) 2 MG tablet Take  1 tablet (2 mg total) by mouth at bedtime. Take 2 mg by mouth every night at bedtime 30 tablet 0   sodium chloride (MURO 128) 2 % ophthalmic solution Place 1 drop into both eyes at bedtime.     testosterone cypionate (DEPOTESTOSTERONE CYPIONATE) 200 MG/ML injection Inject 100 mg into the muscle every 14 (fourteen) days.   1   VELPHORO 500 MG chewable tablet Chew 500-1,000 mg by mouth See admin instructions. Chew 1,000 mg by mouth three times a day with meals and 500 mg with each snack  10   No current facility-administered medications for this visit.     ROS:   General:  No weight loss, Fever, chills  Neurologic: No dizziness, blackouts, seizures. No recent symptoms of stroke or mini- stroke. No recent episodes of slurred speech, or temporary blindness.  Cardiac: No recent episodes of chest pain/pressure, no shortness of breath at rest.  No shortness of breath with exertion.  Denies history of atrial fibrillation or irregular heartbeat  Urinary: _0  chronic Kidney disease, _1  on HD - _2  MWF or _3  TTHS, _4  Burning with urination, _5  Frequent urination, _6   Difficulty urinating;   Skin: No rashes  Psychological: No history of anxiety,  No history of depression   Physical Examination   Vitals:   10/11/18 0854  BP: (!) 106/57  Pulse: 84  Resp: 20  Temp: (!) 97.1 F (36.2 C)  SpO2: 97%  Weight: 285 lb (129.3 kg)  Height: _7  (1.727 m)    General:  Alert and oriented, no acute distress Neck: No JVD Cardiac: Regular Rate and Rhythm  Skin: No rash, arch collaterals right forearm and right chest Extremity Pulses:  2+ radial, brachial, pulsatile left upper arm AV fistula  Musculoskeletal: No deformity or edema  Neurologic: Upper and lower extremity motor 5/5 and symmetric  DATA:  She had a vein mapping ultrasound today which showed occlusion of the cephalic vein bilaterally.  Basilic vein was 5 to 6 mm bilaterally.  Arterial duplex showed patent right brachial artery 5 mm diameter.  Left brachial artery was also 5 mm diameter.  Ulnar artery on the left is occluded.  ASSESSMENT: Multiple prior access procedures.  Patient with history of what sounds like venous hypertension and central vein stenosis on the right side.  He does have a reasonable basilic vein to consider placement of a fistula.   PLAN: Bilateral central venogram for operative planning purposes.  We will need to premedicate him with Solu-Medrol and Benadryl.  This is scheduled for bilateral central venogram with Dr. Trula Slade October 16, 2018.  We will then subsequently determine his appropriate access procedure.  Risk benefits possible complications and procedure details related to contrast reaction and central venogram were discussed with the patient today he understands and agrees to proceed.   Ruta Hinds, MD [facility] Office: [phone] Pager: [phone]

## 2018-10-11 NOTE — H&P (View-Only) (Signed)
Referring Physician: Dr Augustin Coupe  Patient name: Dale [name] MRN: [id number] DOB: 01/14/1966 Sex: male  REASON FOR CONSULT: Dialysis access  HPI: Dale [name] is a 53 y.o. male, who has had multiple prior access procedures.  He has been on dialysis since 1991 intermittently.  He does have a history of a failed transplant in the past.  Most recently he had thrombectomy of a left brachiocephalic AV fistula by Dr. Doren Custard.  Dr. Doren Custard placed a catheter simultaneously due to poor outflow of the fistula.  The fistula currently is nonfunctional.  Dr. Doren Custard suspicion was that he had a central stent that had occluded.  The patient has had prior right thigh graft which was removed for infection.  He is previously had a left forearm AV graft a left radiocephalic AV fistula in the left brachiocephalic AV fistula all of which have failed.  He also had what sounds like a right brachiocephalic fistula and developed swelling in the right upper extremity consistent with venous hypertension in the past.  He does have a history of contrast reaction with skin slough.  He states that this is significantly reduced with pretreatment with Solu-Medrol and Benadryl.  Other medical problems include coronary artery disease he is on aspirin and Effient.  He has had a prior left below-knee amputation and would like to avoid any AV graft in his left leg.  Is currently dialyzing via right sided catheter.  He is right-handed.  His dialysis today is Monday Wednesday Friday.  Past Medical History:  Diagnosis Date   Anal fissure    Anemia of chronic disease    Arthritis    "hands, right knee" (03/19/2014) no cartlidge in right knee   CAD (coronary artery disease)    a. Abnl nuc ->LHC (11/15):  mid to dist Dx 80%, mid RCA 99% (functional CTO), dist RCA 80% >> PCI: balloon angioplasty to mid RCA (could not deliver stent) - staged PCI Rotoblator atherectomy/DES to [facility]. b. NSTEMI 12/2015 s/p DES to diagonal.   Cholecystitis    a.  08/27/2011   Chronic diastolic CHF (congestive heart failure) ([place])    a. Echo 3/13:  EF 55-60%;  b. Echo (11/15):  Mild LVH, EF 60-65%, no RWMA, Gr 1 DD, MAC, mild LAE, normal RVF, mild RAE;  c.  Echo 5/16:  severe LVH, EF 55-60%, no RWMA, Gr 1 DD, MAC, mild LAE   Claustrophobia    when things get around his face.    Complication of anesthesia    Difficult intubation    only once in 2015 at [facility] haven't had a problem since then   Diverticulosis    Esophageal dysmotility    ESRD (end stage renal disease) ([place])    a. 1995 s/p cadaveric transplant w/ susbequent failure after 18 yrs;  b.Dialysis initiated 07/2011 [place] M-W-F   Fibromyalgia    Gastroparesis    GERD (gastroesophageal reflux disease)    Gout    PMH   History of blood transfusion    HLD (hyperlipidemia)    Hypertension    Hypothyroidism    Inguinal lymphadenopathy    a. bilateral - s/p biopsy 07/2011   Insulin dependent diabetes mellitus (HCC)    Type I   Monilial esophagitis (HCC)    Morbid obesity (HCC)    OSA (obstructive sleep apnea)    adjustable bed   PAD (peripheral artery disease) ([place])    a. s/p L BKA.   Pericardial effusion  a.  Small by CT 08/22/11;  b.  Large by CT 08/27/11. c. Not seen on 11/2015 echo.   Pneumonia ~ 2007   PONV (postoperative nausea and vomiting)    vomited once after a procedure.   T1DM (type 1 diabetes mellitus) ([place])    Tracheobronchomalacia    Past Surgical History:  Procedure Laterality Date   A/V FISTULAGRAM Left 07/26/2016   Procedure: A/V Fistulagram;  Surgeon: Waynetta Sandy, MD;  Location: [place] [facility];  Service: Cardiovascular;  Laterality: Left;   A/V FISTULAGRAM N/A 07/11/2017   Procedure: A/V FISTULAGRAM - Left AV;  Surgeon: Waynetta Sandy, MD;  Location: [place] [facility];  Service: Cardiovascular;  Laterality: N/A;   AMPUTATION OF REPLICATED TOES Right    ANGIOPLASTY  04/09/2014   RCA    APPENDECTOMY   ~ 2006   ARTERIOVENOUS GRAFT PLACEMENT Left 1993?   forearm   ARTERIOVENOUS GRAFT PLACEMENT Right 1993?   leg   ARTERIOVENOUS GRAFT PLACEMENT Left    Thigh   Arteriovenous Graft Removed Left    Thigh   AV FISTULA PLACEMENT  07/29/2011   Procedure: ARTERIOVENOUS (AV) FISTULA CREATION;  Surgeon: Mal Misty, MD;  Location: [place];  Service: Vascular;  Laterality: Right;  Brachial cephalic   AV FISTULA PLACEMENT Left 01/20/2015   Procedure: LIGATION OF RIGHT ARTERIOVENOUS FISTULA WITH EXCISION OF ANEURYSM;  Surgeon: Angelia Mould, MD;  Location: [place];  Service: Vascular;  Laterality: Left;   AV FISTULA PLACEMENT Left 04/30/2015   Procedure: CREATION OF LEFT ARM BRACHIO-CEPHALIC ARTERIOVENOUS (AV) FISTULA ;  Surgeon: Angelia Mould, MD;  Location: [place];  Service: Vascular;  Laterality: Left;   AV Graft PLACEMENT Left 1993?   "attempted one in my wrist; didn't take"   BELOW KNEE LEG AMPUTATION Left 2010   CARDIAC CATHETERIZATION  03/19/2014   CARDIAC CATHETERIZATION  03/19/2014   Procedure: CORONARY BALLOON ANGIOPLASTY;  Surgeon: Burnell Blanks, MD;  Location: [facility] CATH LAB;  Service: Cardiovascular;;   CARDIAC CATHETERIZATION N/A 01/01/2016   Procedure: Left Heart Cath and Coronary Angiography;  Surgeon: Troy Sine, MD;  Location: [place] CV [facility];  Service: Cardiovascular;  Laterality: N/A;   CARDIAC CATHETERIZATION N/A 01/01/2016   Procedure: Coronary Stent Intervention;  Surgeon: Troy Sine, MD;  Location: [place] CV [facility];  Service: Cardiovascular;  Laterality: N/A;   CARPAL TUNNEL RELEASE Bilateral after 2005   CATARACT EXTRACTION W/ INTRAOCULAR LENS  IMPLANT, BILATERAL Bilateral 1990's   COLONOSCOPY  08/29/2011   Procedure: COLONOSCOPY;  Surgeon: Jerene Bears, MD;  Location: [place];  Service: Gastroenterology;  Laterality: N/A;   COLONOSCOPY WITH PROPOFOL N/A 04/26/2016   Procedure: COLONOSCOPY WITH PROPOFOL;  Surgeon: Jerene Bears, MD;   Location: [facility] ENDOSCOPY;  Service: Gastroenterology;  Laterality: N/A;   CORONARY ANGIOPLASTY WITH STENT PLACEMENT  04/09/2014       ptca/des mid lad    ESOPHAGOGASTRODUODENOSCOPY N/A 12/25/2015   Procedure: ESOPHAGOGASTRODUODENOSCOPY (EGD);  Surgeon: Mauri Pole, MD;  Location: [facility] ENDOSCOPY;  Service: Endoscopy;  Laterality: N/A;  bedside   ESOPHAGOGASTRODUODENOSCOPY (EGD) WITH PROPOFOL N/A 04/26/2016   Procedure: ESOPHAGOGASTRODUODENOSCOPY (EGD) WITH PROPOFOL;  Surgeon: Jerene Bears, MD;  Location: [facility] ENDOSCOPY;  Service: Gastroenterology;  Laterality: N/A;   EYE SURGERY Bilateral    cataract   FEMORAL ARTERY REPAIR  04/09/2014   ANGIOSEAL   INSERTION OF DIALYSIS CATHETER  08/01/2011   Procedure: INSERTION OF DIALYSIS CATHETER;  Surgeon: Rosetta Posner,  MD;  Location: [place] OR;  Service: Vascular;  Laterality: Right;  insertion of dialysis catheter on right internal jugular vein   INSERTION OF DIALYSIS CATHETER Right 01/20/2015   Procedure: INSERTION OF DIALYSIS CATHETER - RIGHT INTERNAL JUGULAR ;  Surgeon: Angelia Mould, MD;  Location: [place];  Service: Vascular;  Laterality: Right;   INSERTION OF DIALYSIS CATHETER Right 08/02/2018   Procedure: Insertion Of Dialysis Catheter;  Surgeon: Angelia Mould, MD;  Location: [place];  Service: Vascular;  Laterality: Right;   KIDNEY TRANSPLANT  04/25/1993   LEFT HEART CATHETERIZATION WITH CORONARY ANGIOGRAM N/A 03/19/2014   Procedure: LEFT HEART CATHETERIZATION WITH CORONARY ANGIOGRAM;  Surgeon: Burnell Blanks, MD;  Location: [facility] CATH LAB;  Service: Cardiovascular;  Laterality: N/A;   LYMPH NODE BIOPSY  08/10/2011   Procedure: LYMPH NODE BIOPSY;  Surgeon: Merrie Roof, MD;  Location: [place];  Service: General;  Laterality: Left;  left groin lymph biopsy   NEUROPLASTY / TRANSPOSITION ULNAR NERVE AT ELBOW Left after 2005   PATCH ANGIOPLASTY Left 08/09/2016   Procedure: LEFT  ARTERIOVENOUS FISTULA PATCH ANGIOPLASTY;  Surgeon:  Waynetta Sandy, MD;  Location: [place];  Service: Vascular;  Laterality: Left;   PERCUTANEOUS CORONARY ROTOBLATOR INTERVENTION (PCI-R) N/A 04/09/2014   Procedure: PERCUTANEOUS CORONARY ROTOBLATOR INTERVENTION (PCI-R);  Surgeon: Burnell Blanks, MD;  Location: [facility] CATH LAB;  Service: Cardiovascular;  Laterality: N/A;   PERIPHERAL VASCULAR BALLOON ANGIOPLASTY  07/11/2017   Procedure: PERIPHERAL VASCULAR BALLOON ANGIOPLASTY;  Surgeon: Waynetta Sandy, MD;  Location: [place] CV LAB;  Service: Cardiovascular;;  left arm AV fistula   PERIPHERAL VASCULAR CATHETERIZATION N/A 12/25/2014   Procedure: Upper Extremity Venography;  Surgeon: Conrad [place], MD;  Location: [place] [facility];  Service: Cardiovascular;  Laterality: N/A;   PERIPHERAL VASCULAR CATHETERIZATION Left 02/24/2016   Procedure: Fistulagram;  Surgeon: Waynetta Sandy, MD;  Location: [place] [facility];  Service: Cardiovascular;  Laterality: Left;   PERIPHERAL VASCULAR CATHETERIZATION Left 02/24/2016   Procedure: Peripheral Vascular Intervention;  Surgeon: Waynetta Sandy, MD;  Location: [place] [facility];  Service: Cardiovascular;  Laterality: Left;  AV FISTULA   PERITONEAL CATHETER INSERTION     PERITONEAL CATHETER REMOVAL     REVISON OF ARTERIOVENOUS FISTULA Right 4/54/0981   Procedure: PLICATION / REVISION OF ARTERIOVENOUS FISTULA;  Surgeon: Angelia Mould, MD;  Location: [place];  Service: Vascular;  Laterality: Right;   REVISON OF ARTERIOVENOUS FISTULA Left 09/01/2015   Procedure: SUPERFICIALIZATION OF LEFT ARM BRACHIOCEPHALIC ARTERIOVENOUS FISTULA;  Surgeon: Angelia Mould, MD;  Location: [place];  Service: Vascular;  Laterality: Left;   REVISON OF ARTERIOVENOUS FISTULA Left 08/09/2016   Procedure: REVISION OF ARTERIAL ANASTOMOSIS OF ARTERIOVENOUS FISTULA;  Surgeon: Waynetta Sandy, MD;  Location: [place];  Service: Vascular;  Laterality: Left;   SHOULDER ARTHROSCOPY  Left ~ 2006   frozen   THROMBECTOMY W/ EMBOLECTOMY Left 08/02/2018   Procedure: THROMBECTOMY ARTERIOVENOUS FISTULA;  Surgeon: Angelia Mould, MD;  Location: [place];  Service: Vascular;  Laterality: Left;   TOE AMPUTATION Bilateral after 1914   UMBILICAL HERNIA REPAIR  1990's   VENOGRAM Right 07/28/2011   Procedure: VENOGRAM;  Surgeon: Conrad Galion, MD;  Location: [facility] CATH LAB;  Service: Cardiovascular;  Laterality: Right;   VITRECTOMY Bilateral     Family History  Problem Relation Age of Onset   Colon polyps Father    Diabetes Father    Hypertension Father  Hypertension Mother    Diabetes Mother    Hyperlipidemia Mother    Breast cancer Maternal Aunt    Bone cancer Maternal Aunt    Liver cancer Maternal Aunt    Malignant hyperthermia Neg Hx    Heart attack Neg Hx    Stroke Neg Hx    Colon cancer Neg Hx    Stomach cancer Neg Hx    Pancreatic cancer Neg Hx    Esophageal cancer Neg Hx     SOCIAL HISTORY: Social History   Socioeconomic History   Marital status: Married    Spouse name: Not on file   Number of children: Not on file   Years of education: Not on file   Highest education level: Not on file  Occupational History   Occupation: Disabled  Scientist, product/process development strain: Not on file   Food insecurity    Worry: Not on file    Inability: Not on file   Transportation needs    Medical: Not on file    Non-medical: Not on file  Tobacco Use   Smoking status: Never Smoker   Smokeless tobacco: Never Used  Substance and Sexual Activity   Alcohol use: Yes    Comment: occasional   Drug use: No   Sexual activity: Not on file  Lifestyle   Physical activity    Days per week: Not on file    Minutes per session: Not on file   Stress: Not on file  Relationships   Social connections    Talks on phone: Not on file    Gets together: Not on file    Attends religious service: Not on file    Active member of club or  organization: Not on file    Attends meetings of clubs or organizations: Not on file    Relationship status: Not on file   Intimate partner violence    Fear of current or ex partner: Not on file    Emotionally abused: Not on file    Physically abused: Not on file    Forced sexual activity: Not on file  Other Topics Concern   Not on file  Social History Narrative   Lives @ home with his wife in [place].     Allergies  Allergen Reactions   Iodinated Diagnostic Agents Itching, Rash and Other (See Comments)    Systemic itching and transient rash appearance within hours of receiving contrast     Prednisone Other (See Comments)    Increases sugar levels   Adhesive [Tape] Itching and Other (See Comments)    Blisters on arm from adhesive tape at dialysis    Amoxicillin Itching, Rash and Other (See Comments)    Has patient had a PCN reaction causing immediate rash, facial/tongue/throat swelling, SOB or lightheadedness with hypotension: Yes Has patient had a PCN reaction causing severe rash involving mucus membranes or skin necrosis: No Has patient had a PCN reaction that required hospitalization No Has patient had a PCN reaction occurring within the last 10 years: No If all of the above answers are "NO", then may proceed with Cephalosporin use.   Clindamycin/Lincomycin Hives and Itching   Enalapril Rash and Cough    Cough   Mobic [Meloxicam] Hives and Rash   Morphine Hives, Nausea Only and Rash   Vancomycin Hives   Brilinta [Ticagrelor] Nausea And Vomiting   Ciprofloxacin Itching and Rash   Clopidogrel Rash   Codeine Hives, Itching and Rash   Doxycycline Rash  Hydrocodone Rash   Levofloxacin Hives and Itching   Oxycodone Rash   Rocephin [Ceftriaxone] Itching    Current Outpatient Medications  Medication Sig Dispense Refill   allopurinol (ZYLOPRIM) 100 MG tablet Take 2 tablets (200 mg total) by mouth daily. 180 tablet 3   aspirin EC 81 MG tablet Take  81 mg by mouth at bedtime.     atorvastatin (LIPITOR) 10 MG tablet Take 10 mg by mouth daily.      calcium acetate (PHOSLO) 667 MG capsule Take 667-1,334 mg by mouth See admin instructions. Take 1,334 mg by mouth three times a day with meals and 667 mg with each snack     cetirizine (ZYRTEC) 10 MG tablet Take 10 mg by mouth daily.     diphenhydrAMINE (BENADRYL) 50 MG tablet Take 50 mg by mouth every 6 (six) hours as needed (for itching or allergic reactions).     docusate sodium (COLACE) 100 MG capsule Take 100 mg by mouth 2 (two) times daily.      famotidine (PEPCID) 20 MG tablet Take 20 mg by mouth every other day.     fluticasone (FLONASE) 50 MCG/ACT nasal spray Place 2 sprays into both nostrils daily as needed for allergies.      gabapentin (NEURONTIN) 100 MG capsule Take 1 capsule (100 mg total) by mouth at bedtime. 30 capsule 0   GLUCAGON EMERGENCY 1 MG injection Inject 1 mg into the muscle daily as needed (low blood sugar).      hydrocortisone 1 % ointment Apply 1 application topically 2 (two) times daily. 110 g 0   HYDROmorphone (DILAUDID) 2 MG tablet Take 2 mg by mouth 2 (two) times daily.      insulin lispro (HUMALOG) 100 UNIT/ML injection Per insulin pump     levothyroxine (SYNTHROID, LEVOTHROID) 112 MCG tablet Take 112 mcg by mouth every evening. 2 hours after dinner     metoCLOPramide (REGLAN) 10 MG tablet Take 10 mg by mouth 4 (four) times daily.      midodrine (PROAMATINE) 10 MG tablet Take 1 tablet (10 mg total) by mouth 3 (three) times daily. Takes 1 tab in mornings of dialysis and takes 2 tabs 30 mins before starting dialysis (m,w,f) (Patient taking differently: Take 10 mg by mouth See admin instructions. Take 10 mg by mouth on the mornings of dialysis (Mon/Wed/Fri) and 10 mg thirty minutes prior to starting dialysis if BP is low (Mon/Wed/Fri))     multivitamin (RENA-VIT) TABS tablet Take 1 tablet by mouth daily.     nitroGLYCERIN (NITROSTAT) 0.4 MG SL tablet Place  1 tablet (0.4 mg total) under the tongue every 5 (five) minutes as needed for chest pain. 25 tablet 4   Nutritional Supplements (FEEDING SUPPLEMENT, NEPRO CARB STEADY,) LIQD Take 237 mLs by mouth daily. (Patient taking differently: Take 237 mLs by mouth every Monday, Wednesday, and Friday with hemodialysis. ) 30 Can 0   Polyethyl Glycol-Propyl Glycol (SYSTANE) 0.4-0.3 % SOLN Place 1 drop into both eyes at bedtime.      polyethylene glycol (MIRALAX / GLYCOLAX) packet Take 17 g by mouth daily. (Patient taking differently: Take 17 g by mouth daily as needed for mild constipation. ) 14 each 0   prasugrel (EFFIENT) 10 MG TABS tablet TAKE 1 TABLET BY MOUTH EVERY DAY 90 tablet 0   promethazine (PHENERGAN) 25 MG tablet Take 25 mg by mouth every 6 (six) hours as needed for nausea or vomiting.     rOPINIRole (REQUIP) 2 MG tablet Take  1 tablet (2 mg total) by mouth at bedtime. Take 2 mg by mouth every night at bedtime 30 tablet 0   sodium chloride (MURO 128) 2 % ophthalmic solution Place 1 drop into both eyes at bedtime.     testosterone cypionate (DEPOTESTOSTERONE CYPIONATE) 200 MG/ML injection Inject 100 mg into the muscle every 14 (fourteen) days.   1   VELPHORO 500 MG chewable tablet Chew 500-1,000 mg by mouth See admin instructions. Chew 1,000 mg by mouth three times a day with meals and 500 mg with each snack  10   No current facility-administered medications for this visit.     ROS:   General:  No weight loss, Fever, chills  Neurologic: No dizziness, blackouts, seizures. No recent symptoms of stroke or mini- stroke. No recent episodes of slurred speech, or temporary blindness.  Cardiac: No recent episodes of chest pain/pressure, no shortness of breath at rest.  No shortness of breath with exertion.  Denies history of atrial fibrillation or irregular heartbeat  Urinary: _0  chronic Kidney disease, _1  on HD - _2  MWF or _3  TTHS, _4  Burning with urination, _5  Frequent urination, _6   Difficulty urinating;   Skin: No rashes  Psychological: No history of anxiety,  No history of depression   Physical Examination   Vitals:   10/11/18 0854  BP: (!) 106/57  Pulse: 84  Resp: 20  Temp: (!) 97.1 F (36.2 C)  SpO2: 97%  Weight: 285 lb (129.3 kg)  Height: _7  (1.727 m)    General:  Alert and oriented, no acute distress Neck: No JVD Cardiac: Regular Rate and Rhythm  Skin: No rash, arch collaterals right forearm and right chest Extremity Pulses:  2+ radial, brachial, pulsatile left upper arm AV fistula  Musculoskeletal: No deformity or edema  Neurologic: Upper and lower extremity motor 5/5 and symmetric  DATA:  She had a vein mapping ultrasound today which showed occlusion of the cephalic vein bilaterally.  Basilic vein was 5 to 6 mm bilaterally.  Arterial duplex showed patent right brachial artery 5 mm diameter.  Left brachial artery was also 5 mm diameter.  Ulnar artery on the left is occluded.  ASSESSMENT: Multiple prior access procedures.  Patient with history of what sounds like venous hypertension and central vein stenosis on the right side.  He does have a reasonable basilic vein to consider placement of a fistula.   PLAN: Bilateral central venogram for operative planning purposes.  We will need to premedicate him with Solu-Medrol and Benadryl.  This is scheduled for bilateral central venogram with Dr. Trula Slade October 16, 2018.  We will then subsequently determine his appropriate access procedure.  Risk benefits possible complications and procedure details related to contrast reaction and central venogram were discussed with the patient today he understands and agrees to proceed.   Ruta Hinds, MD [facility] Office: [phone] Pager: [phone]

## 2018-10-12 DIAGNOSIS — L299 Pruritus, unspecified: Secondary | ICD-10-CM | POA: Diagnosis not present

## 2018-10-12 DIAGNOSIS — N2581 Secondary hyperparathyroidism of renal origin: Secondary | ICD-10-CM | POA: Diagnosis not present

## 2018-10-12 DIAGNOSIS — D689 Coagulation defect, unspecified: Secondary | ICD-10-CM | POA: Diagnosis not present

## 2018-10-12 DIAGNOSIS — T82818A Embolism of vascular prosthetic devices, implants and grafts, initial encounter: Secondary | ICD-10-CM | POA: Diagnosis not present

## 2018-10-12 DIAGNOSIS — E1022 Type 1 diabetes mellitus with diabetic chronic kidney disease: Secondary | ICD-10-CM | POA: Diagnosis not present

## 2018-10-12 DIAGNOSIS — N186 End stage renal disease: Secondary | ICD-10-CM | POA: Diagnosis not present

## 2018-10-12 DIAGNOSIS — D631 Anemia in chronic kidney disease: Secondary | ICD-10-CM | POA: Diagnosis not present

## 2018-10-12 DIAGNOSIS — R197 Diarrhea, unspecified: Secondary | ICD-10-CM | POA: Diagnosis not present

## 2018-10-14 DIAGNOSIS — E1022 Type 1 diabetes mellitus with diabetic chronic kidney disease: Secondary | ICD-10-CM | POA: Diagnosis not present

## 2018-10-15 DIAGNOSIS — N186 End stage renal disease: Secondary | ICD-10-CM | POA: Diagnosis not present

## 2018-10-15 DIAGNOSIS — D631 Anemia in chronic kidney disease: Secondary | ICD-10-CM | POA: Diagnosis not present

## 2018-10-15 DIAGNOSIS — R197 Diarrhea, unspecified: Secondary | ICD-10-CM | POA: Diagnosis not present

## 2018-10-15 DIAGNOSIS — T82818A Embolism of vascular prosthetic devices, implants and grafts, initial encounter: Secondary | ICD-10-CM | POA: Diagnosis not present

## 2018-10-15 DIAGNOSIS — L299 Pruritus, unspecified: Secondary | ICD-10-CM | POA: Diagnosis not present

## 2018-10-15 DIAGNOSIS — D689 Coagulation defect, unspecified: Secondary | ICD-10-CM | POA: Diagnosis not present

## 2018-10-15 DIAGNOSIS — N2581 Secondary hyperparathyroidism of renal origin: Secondary | ICD-10-CM | POA: Diagnosis not present

## 2018-10-15 DIAGNOSIS — E1022 Type 1 diabetes mellitus with diabetic chronic kidney disease: Secondary | ICD-10-CM | POA: Diagnosis not present

## 2018-10-16 ENCOUNTER — Other Ambulatory Visit: Payer: Self-pay

## 2018-10-16 ENCOUNTER — Encounter (HOSPITAL_COMMUNITY)
Admission: RE | Disposition: A | Discharge status: Home / Self Care | Payer: Self-pay | Source: Home / Self Care | Attending: Surgery

## 2018-10-16 ENCOUNTER — Ambulatory Visit (HOSPITAL_COMMUNITY)
Admission: RE | Admit: 2018-10-16 | Discharge: 2018-10-16 | Disposition: A | Discharge status: Home / Self Care | Payer: Medicare Other | Attending: Surgery | Admitting: Surgery

## 2018-10-16 ENCOUNTER — Encounter (HOSPITAL_COMMUNITY): Payer: Self-pay | Admitting: Surgery

## 2018-10-16 DIAGNOSIS — K219 Gastro-esophageal reflux disease without esophagitis: Secondary | ICD-10-CM | POA: Diagnosis not present

## 2018-10-16 DIAGNOSIS — I5032 Chronic diastolic (congestive) heart failure: Secondary | ICD-10-CM | POA: Diagnosis not present

## 2018-10-16 DIAGNOSIS — Z79890 Hormone replacement therapy (postmenopausal): Secondary | ICD-10-CM | POA: Diagnosis not present

## 2018-10-16 DIAGNOSIS — E1051 Type 1 diabetes mellitus with diabetic peripheral angiopathy without gangrene: Secondary | ICD-10-CM | POA: Diagnosis not present

## 2018-10-16 DIAGNOSIS — N186 End stage renal disease: Secondary | ICD-10-CM | POA: Diagnosis not present

## 2018-10-16 DIAGNOSIS — M109 Gout, unspecified: Secondary | ICD-10-CM | POA: Diagnosis not present

## 2018-10-16 DIAGNOSIS — E1043 Type 1 diabetes mellitus with diabetic autonomic (poly)neuropathy: Secondary | ICD-10-CM | POA: Diagnosis not present

## 2018-10-16 DIAGNOSIS — E1022 Type 1 diabetes mellitus with diabetic chronic kidney disease: Secondary | ICD-10-CM | POA: Insufficient documentation

## 2018-10-16 DIAGNOSIS — Z1159 Encounter for screening for other viral diseases: Secondary | ICD-10-CM | POA: Diagnosis not present

## 2018-10-16 DIAGNOSIS — Z940 Kidney transplant status: Secondary | ICD-10-CM | POA: Insufficient documentation

## 2018-10-16 DIAGNOSIS — Z794 Long term (current) use of insulin: Secondary | ICD-10-CM | POA: Insufficient documentation

## 2018-10-16 DIAGNOSIS — E785 Hyperlipidemia, unspecified: Secondary | ICD-10-CM | POA: Insufficient documentation

## 2018-10-16 DIAGNOSIS — Z6841 Body Mass Index (BMI) 40.0 and over, adult: Secondary | ICD-10-CM | POA: Insufficient documentation

## 2018-10-16 DIAGNOSIS — I2510 Atherosclerotic heart disease of native coronary artery without angina pectoris: Secondary | ICD-10-CM | POA: Insufficient documentation

## 2018-10-16 DIAGNOSIS — Z79899 Other long term (current) drug therapy: Secondary | ICD-10-CM | POA: Insufficient documentation

## 2018-10-16 DIAGNOSIS — I252 Old myocardial infarction: Secondary | ICD-10-CM | POA: Diagnosis not present

## 2018-10-16 DIAGNOSIS — D631 Anemia in chronic kidney disease: Secondary | ICD-10-CM | POA: Diagnosis not present

## 2018-10-16 DIAGNOSIS — I132 Hypertensive heart and chronic kidney disease with heart failure and with stage 5 chronic kidney disease, or end stage renal disease: Secondary | ICD-10-CM | POA: Diagnosis not present

## 2018-10-16 DIAGNOSIS — G4733 Obstructive sleep apnea (adult) (pediatric): Secondary | ICD-10-CM | POA: Insufficient documentation

## 2018-10-16 DIAGNOSIS — Z7982 Long term (current) use of aspirin: Secondary | ICD-10-CM | POA: Diagnosis not present

## 2018-10-16 DIAGNOSIS — K3184 Gastroparesis: Secondary | ICD-10-CM | POA: Diagnosis not present

## 2018-10-16 DIAGNOSIS — N185 Chronic kidney disease, stage 5: Secondary | ICD-10-CM | POA: Diagnosis not present

## 2018-10-16 DIAGNOSIS — E039 Hypothyroidism, unspecified: Secondary | ICD-10-CM | POA: Insufficient documentation

## 2018-10-16 HISTORY — PX: UPPER EXTREMITY VENOGRAPHY: CATH118272

## 2018-10-16 LAB — POCT I-STAT, CHEM 8
BUN: 91 mg/dL — ABNORMAL HIGH (ref 6–20)
Calcium, Ion: 1.2 mmol/L (ref 1.15–1.40)
Chloride: 100 mmol/L (ref 98–111)
Creatinine, Ser: 9.5 mg/dL — ABNORMAL HIGH (ref 0.61–1.24)
Glucose, Bld: 299 mg/dL — ABNORMAL HIGH (ref 70–99)
HCT: 44 % (ref 39.0–52.0)
Hemoglobin: 15 g/dL (ref 13.0–17.0)
Potassium: 5.5 mmol/L — ABNORMAL HIGH (ref 3.5–5.1)
Sodium: 133 mmol/L — ABNORMAL LOW (ref 135–145)
TCO2: 31 mmol/L (ref 22–32)

## 2018-10-16 LAB — SARS CORONAVIRUS 2 BY RT PCR (HOSPITAL ORDER, PERFORMED IN ~~LOC~~ HOSPITAL LAB): SARS Coronavirus 2: NEGATIVE

## 2018-10-16 SURGERY — UPPER EXTREMITY VENOGRAPHY
Anesthesia: LOCAL | Laterality: Bilateral

## 2018-10-16 MED ORDER — IODIXANOL 320 MG/ML IV SOLN
INTRAVENOUS | Status: DC | PRN
  Administered 2018-10-16: 100 mL via INTRAVENOUS

## 2018-10-16 MED ORDER — SODIUM CHLORIDE 0.9% FLUSH: 3.0000 mL | INTRAVENOUS | Status: DC | PRN

## 2018-10-16 MED ORDER — DIPHENHYDRAMINE HCL 50 MG/ML IJ SOLN
50.0000 mg | Freq: Once | INTRAMUSCULAR | Status: AC
  Administered 2018-10-16: 50 mg via INTRAVENOUS

## 2018-10-16 MED ORDER — LIDOCAINE HCL (PF) 1 % IJ SOLN
INTRAMUSCULAR | Status: DC | PRN
  Administered 2018-10-16: 3 mL via INTRADERMAL

## 2018-10-16 MED ORDER — FENTANYL CITRATE (PF) 100 MCG/2ML IJ SOLN
INTRAMUSCULAR | Status: AC
  Filled 2018-10-16: qty 2

## 2018-10-16 MED ORDER — METHYLPREDNISOLONE SODIUM SUCC 125 MG IJ SOLR
125.0000 mg | INTRAMUSCULAR | Status: AC
  Administered 2018-10-16: 125 mg via INTRAVENOUS
  Filled 2018-10-16: qty 2

## 2018-10-16 MED ORDER — FENTANYL CITRATE (PF) 100 MCG/2ML IJ SOLN
INTRAMUSCULAR | Status: DC | PRN
  Administered 2018-10-16: 25 ug via INTRAVENOUS

## 2018-10-16 MED ORDER — LIDOCAINE HCL (PF) 1 % IJ SOLN
INTRAMUSCULAR | Status: AC
  Filled 2018-10-16: qty 30

## 2018-10-16 MED ORDER — DIPHENHYDRAMINE HCL 50 MG/ML IJ SOLN
25.0000 mg | INTRAMUSCULAR | Status: DC
  Filled 2018-10-16: qty 1

## 2018-10-16 SURGICAL SUPPLY — 3 items
KIT MICROPUNCTURE NIT STIFF (SHEATH) ×1 IMPLANT
STOPCOCK MORSE 400PSI 3WAY (MISCELLANEOUS) ×1 IMPLANT
TUBING CIL FLEX 10 FLL-RA (TUBING) ×1 IMPLANT

## 2018-10-16 NOTE — Discharge Instructions (Signed)
Venogram, Care After This sheet gives you information about how to care for yourself after your procedure. Your health care provider may also give you more specific instructions. If you have problems or questions, contact your health care provider. What can I expect after the procedure? After the procedure, it is common to have:  Bruising or mild discomfort in the area where the IV was inserted (insertion site). Follow these instructions at home: Eating and drinking   Follow instructions from your health care provider about eating or drinking restrictions.  Drink a lot of fluids for the first several days after the procedure, as directed by your health care provider. This helps to wash (flush) the contrast out of your body. Examples of healthy fluids include water or low-calorie drinks. General instructions  Check your IV insertion area every day for signs of infection. Check for: ? Redness, swelling, or pain. ? Fluid or blood. ? Warmth. ? Pus or a bad smell.  Take over-the-counter and prescription medicines only as told by your health care provider.  Rest and return to your normal activities as told by your health care provider. Ask your health care provider what activities are safe for you.  Do not drive for 24 hours if you were given a medicine to help you relax (sedative), or until your health care provider approves.  Keep all follow-up visits as told by your health care provider. This is important. Contact a health care provider if:  Your skin becomes itchy or you develop a rash or hives.  You have a fever that does not get better with medicine.  You feel nauseous.  You vomit.  You have redness, swelling, or pain around the insertion site.  You have fluid or blood coming from the insertion site.  Your insertion area feels warm to the touch.  You have pus or a bad smell coming from the insertion site. Get help right away if:  You have difficulty breathing or  shortness of breath.  You develop chest pain.  You faint.  You feel very dizzy. These symptoms may represent a serious problem that is an emergency. Do not wait to see if the symptoms will go away. Get medical help right away. Call your local emergency services (911 in the [place] Do not drive yourself to the hospital. Summary  After your procedure, it is common to have bruising or mild discomfort in the area where the IV was inserted.  You should check your IV insertion area every day for signs of infection.  Take over-the-counter and prescription medicines only as told by your health care provider.  You should drink a lot of fluids for the first several days after the procedure to help flush the contrast from your body. This information is not intended to replace advice given to you by your health care provider. Make sure you discuss any questions you have with your health care provider. Document Released: 01/30/2013 Document Revised: 03/05/2016 Document Reviewed: 03/05/2016 Elsevier Interactive Patient Education  2019 [employer].

## 2018-10-16 NOTE — Interval H&P Note (Signed)
History and Physical Interval Note:  10/16/2018 9:24 AM  Dale [name]  has presented today for surgery, with the diagnosis of End stage renal disease.  The various methods of treatment have been discussed with the patient and family. After consideration of risks, benefits and other options for treatment, the patient has consented to  Procedure(s): UPPER EXTREMITY VENOGRAPHY (Bilateral) as a surgical intervention.  [name] history has been reviewed, patient examined, no change in status, stable for surgery.  I have reviewed the patient's chart and labs.  Questions were answered to the patient's satisfaction.     Annamarie Major

## 2018-10-16 NOTE — Progress Notes (Signed)
Unable to thread IV// slow blood draw, inaccurate results will redraw.

## 2018-10-16 NOTE — Op Note (Addendum)
    Patient name: Dale [name] MRN: [id number] DOB: 1965/05/06 Sex: male  10/16/2018 Pre-operative Diagnosis: ESRD Post-operative diagnosis:  Same Surgeon:  Annamarie Major Procedure Performed:  1.  Bilateral upper extremity venogram  2.  U/s guided placement of catheter in left brachial vein     Indications:  The patient is in need of access for HD.    Procedure:  The patient was identified in the holding area and taken to room 8.  The patient was then placed supine on the table and prepped and draped in the usual sterile fashion.  A time out was called.Contrast injections were performed through peripheral IV's.  He was pre-medicated for a dye allergy.  I did not get good images on the left and so under u/s guidance, I placed a micropuncture sheath in to the left brachial vein  Findings:  Central vein occlusion on the right.  The left central venous system is patent without stenosis  Impression:  #1  Occluded right sided central venous system  #2  Left sided central venous system is patent without stenosis3  #3  Patient to be scheduled for left sided staged BVT    V. Annamarie Major, M.D., [facility] [place] Office: [phone] Pager:  [phone]

## 2018-10-17 DIAGNOSIS — N186 End stage renal disease: Secondary | ICD-10-CM | POA: Diagnosis not present

## 2018-10-17 DIAGNOSIS — T82818A Embolism of vascular prosthetic devices, implants and grafts, initial encounter: Secondary | ICD-10-CM | POA: Diagnosis not present

## 2018-10-17 DIAGNOSIS — D689 Coagulation defect, unspecified: Secondary | ICD-10-CM | POA: Diagnosis not present

## 2018-10-17 DIAGNOSIS — N2581 Secondary hyperparathyroidism of renal origin: Secondary | ICD-10-CM | POA: Diagnosis not present

## 2018-10-17 DIAGNOSIS — L299 Pruritus, unspecified: Secondary | ICD-10-CM | POA: Diagnosis not present

## 2018-10-17 DIAGNOSIS — D631 Anemia in chronic kidney disease: Secondary | ICD-10-CM | POA: Diagnosis not present

## 2018-10-17 DIAGNOSIS — E1022 Type 1 diabetes mellitus with diabetic chronic kidney disease: Secondary | ICD-10-CM | POA: Diagnosis not present

## 2018-10-17 DIAGNOSIS — R197 Diarrhea, unspecified: Secondary | ICD-10-CM | POA: Diagnosis not present

## 2018-10-18 LAB — POCT I-STAT, CHEM 8
BUN: 118 mg/dL — ABNORMAL HIGH (ref 6–20)
Calcium, Ion: 1.03 mmol/L — ABNORMAL LOW (ref 1.15–1.40)
Chloride: 101 mmol/L (ref 98–111)
Creatinine, Ser: 10.2 mg/dL — ABNORMAL HIGH (ref 0.61–1.24)
Glucose, Bld: 315 mg/dL — ABNORMAL HIGH (ref 70–99)
HCT: 45 % (ref 39.0–52.0)
Hemoglobin: 15.3 g/dL (ref 13.0–17.0)
Potassium: 7.8 mmol/L (ref 3.5–5.1)
Sodium: 130 mmol/L — ABNORMAL LOW (ref 135–145)
TCO2: 29 mmol/L (ref 22–32)

## 2018-10-19 DIAGNOSIS — E1022 Type 1 diabetes mellitus with diabetic chronic kidney disease: Secondary | ICD-10-CM | POA: Diagnosis not present

## 2018-10-19 DIAGNOSIS — N2581 Secondary hyperparathyroidism of renal origin: Secondary | ICD-10-CM | POA: Diagnosis not present

## 2018-10-19 DIAGNOSIS — N186 End stage renal disease: Secondary | ICD-10-CM | POA: Diagnosis not present

## 2018-10-19 DIAGNOSIS — L299 Pruritus, unspecified: Secondary | ICD-10-CM | POA: Diagnosis not present

## 2018-10-19 DIAGNOSIS — R197 Diarrhea, unspecified: Secondary | ICD-10-CM | POA: Diagnosis not present

## 2018-10-19 DIAGNOSIS — T82818A Embolism of vascular prosthetic devices, implants and grafts, initial encounter: Secondary | ICD-10-CM | POA: Diagnosis not present

## 2018-10-19 DIAGNOSIS — D689 Coagulation defect, unspecified: Secondary | ICD-10-CM | POA: Diagnosis not present

## 2018-10-19 DIAGNOSIS — D631 Anemia in chronic kidney disease: Secondary | ICD-10-CM | POA: Diagnosis not present

## 2018-10-22 DIAGNOSIS — D631 Anemia in chronic kidney disease: Secondary | ICD-10-CM | POA: Diagnosis not present

## 2018-10-22 DIAGNOSIS — E1022 Type 1 diabetes mellitus with diabetic chronic kidney disease: Secondary | ICD-10-CM | POA: Diagnosis not present

## 2018-10-22 DIAGNOSIS — N2581 Secondary hyperparathyroidism of renal origin: Secondary | ICD-10-CM | POA: Diagnosis not present

## 2018-10-22 DIAGNOSIS — L299 Pruritus, unspecified: Secondary | ICD-10-CM | POA: Diagnosis not present

## 2018-10-22 DIAGNOSIS — T82818A Embolism of vascular prosthetic devices, implants and grafts, initial encounter: Secondary | ICD-10-CM | POA: Diagnosis not present

## 2018-10-22 DIAGNOSIS — N186 End stage renal disease: Secondary | ICD-10-CM | POA: Diagnosis not present

## 2018-10-22 DIAGNOSIS — R197 Diarrhea, unspecified: Secondary | ICD-10-CM | POA: Diagnosis not present

## 2018-10-22 DIAGNOSIS — D689 Coagulation defect, unspecified: Secondary | ICD-10-CM | POA: Diagnosis not present

## 2018-10-23 ENCOUNTER — Other Ambulatory Visit: Payer: Self-pay | Admitting: *Deleted

## 2018-10-23 ENCOUNTER — Telehealth: Payer: Self-pay | Admitting: *Deleted

## 2018-10-23 DIAGNOSIS — N186 End stage renal disease: Secondary | ICD-10-CM | POA: Diagnosis not present

## 2018-10-23 DIAGNOSIS — T8612 Kidney transplant failure: Secondary | ICD-10-CM | POA: Diagnosis not present

## 2018-10-23 DIAGNOSIS — Z992 Dependence on renal dialysis: Secondary | ICD-10-CM | POA: Diagnosis not present

## 2018-10-23 NOTE — Telephone Encounter (Signed)
ALL TO PATIENT WITH PRE-OP INSTRUCTIONS. To be at [facility] admitting at 6:30 am on 11/01/2018 to allow for same day nasal swab for Covid.  NPO past MN night prior. Expect a call and follow the detailed surgery instructions/ insulin pump instructions received from hospital pre-admission department. Hold EFFIENT for 5 days pre-op per Dr. Trula Slade. Must have a driver and care giver for discharge to home.

## 2018-10-23 NOTE — Progress Notes (Signed)
Consult to [facility] hospital pharmacist for pre-op antibiotic. Forestine Na. See orders per pharmacy for pre-op antibiotic.

## 2018-10-24 DIAGNOSIS — N2581 Secondary hyperparathyroidism of renal origin: Secondary | ICD-10-CM | POA: Diagnosis not present

## 2018-10-24 DIAGNOSIS — N186 End stage renal disease: Secondary | ICD-10-CM | POA: Diagnosis not present

## 2018-10-24 DIAGNOSIS — Z4931 Encounter for adequacy testing for hemodialysis: Secondary | ICD-10-CM | POA: Diagnosis not present

## 2018-10-24 DIAGNOSIS — D689 Coagulation defect, unspecified: Secondary | ICD-10-CM | POA: Diagnosis not present

## 2018-10-24 DIAGNOSIS — E1022 Type 1 diabetes mellitus with diabetic chronic kidney disease: Secondary | ICD-10-CM | POA: Diagnosis not present

## 2018-10-24 DIAGNOSIS — T82818A Embolism of vascular prosthetic devices, implants and grafts, initial encounter: Secondary | ICD-10-CM | POA: Diagnosis not present

## 2018-10-24 DIAGNOSIS — L299 Pruritus, unspecified: Secondary | ICD-10-CM | POA: Diagnosis not present

## 2018-10-24 DIAGNOSIS — D631 Anemia in chronic kidney disease: Secondary | ICD-10-CM | POA: Diagnosis not present

## 2018-10-25 ENCOUNTER — Telehealth: Payer: Self-pay

## 2018-10-25 NOTE — Telephone Encounter (Signed)
Pt called and said that he would really like to speak with Dr Trula Slade,. He said that he has been contemplating this surgery he has scheduled for Thursday and he is really thinking that he is going to cancel it and not have anything else done. He said that he has had so many and he is just getting to the point he no longer wants to keep going through this.   Appt made for phone visit with [place] and pt notified that we would call him on Monday.   York Cerise, CMA

## 2018-10-26 DIAGNOSIS — L299 Pruritus, unspecified: Secondary | ICD-10-CM | POA: Diagnosis not present

## 2018-10-26 DIAGNOSIS — N2581 Secondary hyperparathyroidism of renal origin: Secondary | ICD-10-CM | POA: Diagnosis not present

## 2018-10-26 DIAGNOSIS — T82818A Embolism of vascular prosthetic devices, implants and grafts, initial encounter: Secondary | ICD-10-CM | POA: Diagnosis not present

## 2018-10-26 DIAGNOSIS — Z4931 Encounter for adequacy testing for hemodialysis: Secondary | ICD-10-CM | POA: Diagnosis not present

## 2018-10-26 DIAGNOSIS — E1022 Type 1 diabetes mellitus with diabetic chronic kidney disease: Secondary | ICD-10-CM | POA: Diagnosis not present

## 2018-10-26 DIAGNOSIS — D631 Anemia in chronic kidney disease: Secondary | ICD-10-CM | POA: Diagnosis not present

## 2018-10-26 DIAGNOSIS — N186 End stage renal disease: Secondary | ICD-10-CM | POA: Diagnosis not present

## 2018-10-26 DIAGNOSIS — D689 Coagulation defect, unspecified: Secondary | ICD-10-CM | POA: Diagnosis not present

## 2018-10-29 ENCOUNTER — Encounter: Payer: Self-pay | Admitting: Surgery

## 2018-10-29 ENCOUNTER — Other Ambulatory Visit: Payer: Self-pay

## 2018-10-29 ENCOUNTER — Ambulatory Visit (INDEPENDENT_AMBULATORY_CARE_PROVIDER_SITE_OTHER): Payer: Self-pay | Admitting: Surgery

## 2018-10-29 DIAGNOSIS — Z4931 Encounter for adequacy testing for hemodialysis: Secondary | ICD-10-CM | POA: Diagnosis not present

## 2018-10-29 DIAGNOSIS — L299 Pruritus, unspecified: Secondary | ICD-10-CM | POA: Diagnosis not present

## 2018-10-29 DIAGNOSIS — N186 End stage renal disease: Secondary | ICD-10-CM | POA: Diagnosis not present

## 2018-10-29 DIAGNOSIS — D689 Coagulation defect, unspecified: Secondary | ICD-10-CM | POA: Diagnosis not present

## 2018-10-29 DIAGNOSIS — E1022 Type 1 diabetes mellitus with diabetic chronic kidney disease: Secondary | ICD-10-CM | POA: Diagnosis not present

## 2018-10-29 DIAGNOSIS — N2581 Secondary hyperparathyroidism of renal origin: Secondary | ICD-10-CM | POA: Diagnosis not present

## 2018-10-29 DIAGNOSIS — Z992 Dependence on renal dialysis: Secondary | ICD-10-CM

## 2018-10-29 DIAGNOSIS — T82818A Embolism of vascular prosthetic devices, implants and grafts, initial encounter: Secondary | ICD-10-CM | POA: Diagnosis not present

## 2018-10-29 DIAGNOSIS — D631 Anemia in chronic kidney disease: Secondary | ICD-10-CM | POA: Diagnosis not present

## 2018-10-29 NOTE — Progress Notes (Signed)
[facility] [place]  Patient name: Dale [name] MRN: [id number] DOB: 1966/04/14 Sex: male      Virtual Visit via Telephone Note   This visit type was conducted due to national recommendations for restrictions regarding the COVID-19 Pandemic (e.g. social distancing) in an effort to limit this patient's exposure and mitigate transmission in our community.  Due to his co-morbid illnesses, this patient is at least at moderate risk for complications without adequate follow up.  This format is felt to be most appropriate for this patient at this time.  The patient did not have access to video technology/had technical difficulties with video requiring transitioning to audio format only (telephone).  All issues noted in this document were discussed and addressed.  No physical exam could be performed with this format.    Patient Location: Home Provider Location: Office    REASON FOR APPOINTMENT:    Surgery questions  HISTORY OF PRESENT ILLNESS:   Dale [name] is a 53 y.o. male, who is being evaluated for new dialysis access.  He recently had a venogram to determine his next location for access.  It was felt based off of his venogram that he could have a for staged left basilic vein fistula creation.  The patient has multiple questions regarding his options.  He would prefer to leave his catheter in.  He has had a thigh graft in the past and refuses another thigh graft.  The patient has multiple medical issues including coronary artery disease, peripheral vascular disease, status post amputation, hypercholesterolemia, hypertension.  He is on Effient as well.  The patient does not have symptoms concerning for COVID-19 infection (fever, chills, cough, or new shortness of breath).   PAST MEDICAL HISTORY    Past Medical History:  Diagnosis Date  . Anal fissure   . Anemia of chronic disease   . Arthritis    "hands, right knee" (03/19/2014) no cartlidge  in right knee  . CAD (coronary artery disease)    a. Abnl nuc ->LHC (11/15):  mid to dist Dx 80%, mid RCA 99% (functional CTO), dist RCA 80% >> PCI: balloon angioplasty to mid RCA (could not deliver stent) - staged PCI Rotoblator atherectomy/DES to [facility] b. NSTEMI 12/2015 s/p DES to diagonal.  . Cholecystitis    a. 08/27/2011  . Chronic diastolic CHF (congestive heart failure) ([place])    a. Echo 3/13:  EF 55-60%;  b. Echo (11/15):  Mild LVH, EF 60-65%, no RWMA, Gr 1 DD, MAC, mild LAE, normal RVF, mild RAE;  c.  Echo 5/16:  severe LVH, EF 55-60%, no RWMA, Gr 1 DD, MAC, mild LAE  . Claustrophobia    when things get around his face.   . Complication of anesthesia   . Difficult intubation    only once in 2015 at [facility] haven't had a problem since then  . Diverticulosis   . Esophageal dysmotility   . ESRD (end stage renal disease) ([place])    a. 1995 s/p cadaveric transplant w/ susbequent failure after 18 yrs;  b.Dialysis initiated 07/2011 [facility] M[facility]  . Fibromyalgia   . Gastroparesis   . GERD (gastroesophageal reflux disease)   . Gout    PMH  . History of blood transfusion   . HLD (hyperlipidemia)   . Hypertension   . Hypothyroidism   . Inguinal lymphadenopathy    a. bilateral - s/p biopsy 07/2011  .  Insulin dependent diabetes mellitus (HCC)    Type I  . Monilial esophagitis ([place])   . Morbid obesity ([place])   . OSA (obstructive sleep apnea)    adjustable bed  . PAD (peripheral artery disease) (HCC)    a. s/p L BKA.  [name] Kitchen Pericardial effusion    a.  Small by CT 08/22/11;  b.  Large by CT 08/27/11. c. Not seen on 11/2015 echo.  . Pneumonia ~ 2007  . PONV (postoperative nausea and vomiting)    vomited once after a procedure.  . T1DM (type 1 diabetes mellitus) ([place])   . Tracheobronchomalacia      FAMILY HISTORY   Family History  Problem Relation Age of Onset  . Colon polyps Father   . Diabetes Father   . Hypertension Father   . Hypertension Mother   . Diabetes Mother   .  Hyperlipidemia Mother   . Breast cancer Maternal Aunt   . Bone cancer Maternal Aunt   . Liver cancer Maternal Aunt   . Malignant hyperthermia Neg Hx   . Heart attack Neg Hx   . Stroke Neg Hx   . Colon cancer Neg Hx   . Stomach cancer Neg Hx   . Pancreatic cancer Neg Hx   . Esophageal cancer Neg Hx     SOCIAL HISTORY:   Social History   Socioeconomic History  . Marital status: Married    Spouse name: Not on file  . Number of children: Not on file  . Years of education: Not on file  . Highest education level: Not on file  Occupational History  . Occupation: Disabled  Social Needs  . Financial resource strain: Not on file  . Food insecurity    Worry: Not on file    Inability: Not on file  . Transportation needs    Medical: Not on file    Non-medical: Not on file  Tobacco Use  . Smoking status: Never Smoker  . Smokeless tobacco: Never Used  Substance and Sexual Activity  . Alcohol use: Yes    Comment: occasional  . Drug use: No  . Sexual activity: Not on file  Lifestyle  . Physical activity    Days per week: Not on file    Minutes per session: Not on file  . Stress: Not on file  Relationships  . Social Herbalist on phone: Not on file    Gets together: Not on file    Attends religious service: Not on file    Active member of club or organization: Not on file    Attends meetings of clubs or organizations: Not on file    Relationship status: Not on file  . Intimate partner violence    Fear of current or ex partner: Not on file    Emotionally abused: Not on file    Physically abused: Not on file    Forced sexual activity: Not on file  Other Topics Concern  . Not on file  Social History Narrative   Lives @ home with his wife in [place].     ALLERGIES:    Allergies  Allergen Reactions  . Iodinated Diagnostic Agents Itching, Rash and Other (See Comments)    Systemic itching and transient rash appearance within hours of receiving contrast     . Prednisone Other (See Comments)    Increases sugar levels  . Adhesive [Tape] Itching and Other (See Comments)    Blisters on arm from adhesive tape at dialysis   .  Amoxicillin Itching, Rash and Other (See Comments)    Has patient had a PCN reaction causing immediate rash, facial/tongue/throat swelling, SOB or lightheadedness with hypotension: Yes Has patient had a PCN reaction causing severe rash involving mucus membranes or skin necrosis: No Has patient had a PCN reaction that required hospitalization No Has patient had a PCN reaction occurring within the last 10 years: No If all of the above answers are "NO", then may proceed with Cephalosporin use.  . Clindamycin/Lincomycin Hives and Itching  . Enalapril Rash and Cough    Cough  . Mobic [Meloxicam] Hives and Rash  . Morphine Hives, Nausea Only and Rash  . Vancomycin Hives  . Brilinta [Ticagrelor] Nausea And Vomiting  . Ciprofloxacin Itching and Rash  . Clopidogrel Rash  . Codeine Hives, Itching and Rash  . Doxycycline Rash  . Hydrocodone Rash  . Levofloxacin Hives and Itching  . Oxycodone Rash  . Rocephin [Ceftriaxone] Itching    CURRENT MEDICATIONS:    Current Outpatient Medications  Medication Sig Dispense Refill  . acetaminophen (TYLENOL) 500 MG tablet Take 1,000 mg by mouth at bedtime.    [name] Kitchen allopurinol (ZYLOPRIM) 100 MG tablet Take 2 tablets (200 mg total) by mouth daily. 180 tablet 3  . aspirin EC 81 MG tablet Take 81 mg by mouth at bedtime.    [name] Kitchen atorvastatin (LIPITOR) 10 MG tablet Take 10 mg by mouth daily.     . calcium acetate (PHOSLO) 667 MG capsule Take 1,334 mg by mouth 3 (three) times daily with meals.     . cetirizine (ZYRTEC) 10 MG tablet Take 10 mg by mouth daily.    . diphenhydrAMINE (BENADRYL) 25 MG tablet Take 50 mg by mouth at bedtime.     . docusate sodium (COLACE) 100 MG capsule Take 100 mg by mouth 2 (two) times daily.     [name] Kitchen esomeprazole (NEXIUM) 20 MG capsule Take 20 mg by mouth daily at 12 noon.     . famotidine (PEPCID) 20 MG tablet Take 20 mg by mouth daily as needed for heartburn or indigestion.     . fluticasone (FLONASE) 50 MCG/ACT nasal spray Place 2 sprays into both nostrils daily as needed for allergies.     [name] Kitchen gabapentin (NEURONTIN) 300 MG capsule Take 300 mg by mouth at bedtime.    [name] Kitchen GLUCAGON EMERGENCY 1 MG injection Inject 1 mg into the muscle daily as needed (low blood sugar).     . hydrocortisone 2.5 % ointment Apply 1 application topically 2 (two) times daily as needed (itching).    [name] Kitchen HYDROmorphone (DILAUDID) 2 MG tablet Take 2 mg by mouth 2 (two) times daily.     . hydrOXYzine (ATARAX/VISTARIL) 10 MG tablet Take 10 mg by mouth 3 (three) times daily as needed for itching.    . insulin lispro (HUMALOG) 100 UNIT/ML injection Inject 75-120 Units into the skin continuous. Per insulin pump    . levothyroxine (SYNTHROID, LEVOTHROID) 112 MCG tablet Take 112 mcg by mouth at bedtime.     . Melatonin 5 MG TABS Take 5 mg by mouth at bedtime.    . metoCLOPramide (REGLAN) 10 MG tablet Take 10 mg by mouth 4 (four) times daily -  before meals and at bedtime.     . midodrine (PROAMATINE) 10 MG tablet Take 1 tablet (10 mg total) by mouth 3 (three) times daily. Takes 1 tab in mornings of dialysis and takes 2 tabs 30 mins before starting dialysis (m,w,f)    . multivitamin (  RENA-VIT) TABS tablet Take 1 tablet by mouth daily.    . nitroGLYCERIN (NITROSTAT) 0.4 MG SL tablet Place 1 tablet (0.4 mg total) under the tongue every 5 (five) minutes as needed for chest pain. 25 tablet 4  . Nutritional Supplements (FEEDING SUPPLEMENT, NEPRO CARB STEADY,) LIQD Take 237 mLs by mouth daily. (Patient taking differently: Take 237 mLs by mouth 3 (three) times a week. ) 30 Can 0  . nystatin (NYSTATIN) powder Apply 1 g topically daily as needed (yeast).    Vladimir Faster Glycol-Propyl Glycol (SYSTANE) 0.4-0.3 % SOLN Place 1 drop into both eyes at bedtime.     . polyethylene glycol (MIRALAX / GLYCOLAX) packet Take 17 g  by mouth daily. (Patient taking differently: Take 17 g by mouth daily as needed for mild constipation. ) 14 each 0  . prasugrel (EFFIENT) 10 MG TABS tablet TAKE 1 TABLET BY MOUTH EVERY DAY (Patient taking differently: Take 10 mg by mouth daily. ) 90 tablet 0  . promethazine (PHENERGAN) 25 MG tablet Take 25 mg by mouth every 6 (six) hours as needed for nausea or vomiting.    . sodium chloride (MURO 128) 5 % ophthalmic ointment Place 1 drop into both eyes at bedtime.     [name] Kitchen testosterone cypionate (DEPOTESTOSTERONE CYPIONATE) 200 MG/ML injection Inject 100 mg into the muscle every 14 (fourteen) days.   1  . VELPHORO 500 MG chewable tablet Chew 500-1,000 mg by mouth See admin instructions. Chew 1,000 mg by mouth three times a day with meals and 500 mg twice daily with snacks  10  . rOPINIRole (REQUIP) 2 MG tablet Take 1 tablet (2 mg total) by mouth at bedtime. Take 2 mg by mouth every night at bedtime 30 tablet 0   No current facility-administered medications for this visit.     REVIEW OF SYSTEMS:   Please see the history of present illness.     All other systems reviewed and are negative.  PHYSICAL EXAM:   There were no vitals filed for this visit.  GENERAL: The patient is a well-nourished male, in no acute distress.  PSYCHIATRIC: The patient has a normal affect.   Recent Labs: 08/01/2018: ALT 15 08/02/2018: Magnesium 2.7; Platelets 232; TSH 3.816 10/16/2018: BUN 91; Creatinine, Ser 9.50; Hemoglobin 15.0; Potassium 5.5; Sodium 133   Recent Lipid Panel Lab Results  Component Value Date/Time   CHOL 143 02/25/2015 02:00 PM   TRIG 255 (H) 02/25/2015 02:00 PM   HDL 29 (L) 02/25/2015 02:00 PM   CHOLHDL 4.9 02/25/2015 02:00 PM   LDLCALC 63 02/25/2015 02:00 PM    Wt Readings from Last 3 Encounters:  10/16/18 280 lb (127 kg)  10/11/18 285 lb (129.3 kg)  08/03/18 285 lb 4.4 oz (129.4 kg)     STUDIES:   None  ASSESSMENT and PLAN   We discussed proceeding with a first stage left  basilic vein fistula creation.  He will need to be off of his Effient.  He is scheduled for this Thursday.   Time:   Today, I have spent 12 minutes with the patient with telehealth technology discussing the above problems.        Leia Alf, MD, FACS [facility] [phone] Pager [phone]

## 2018-10-29 NOTE — H&P (View-Only) (Signed)
[facility] [place]  Patient name: Dale [name] MRN: [id number] DOB: [birth year] Sex: male      Virtual Visit via Telephone Note   This visit type was conducted due to national recommendations for restrictions regarding the COVID-19 Pandemic (e.g. social distancing) in an effort to limit this patient's exposure and mitigate transmission in our community.  Due to his co-morbid illnesses, this patient is at least at moderate risk for complications without adequate follow up.  This format is felt to be most appropriate for this patient at this time.  The patient did not have access to video technology/had technical difficulties with video requiring transitioning to audio format only (telephone).  All issues noted in this document were discussed and addressed.  No physical exam could be performed with this format.    Patient Location: Home Provider Location: Office    REASON FOR APPOINTMENT:    Surgery questions  HISTORY OF PRESENT ILLNESS:   Dale [name] is a 53 y.o. male, who is being evaluated for new dialysis access.  He recently had a venogram to determine his next location for access.  It was felt based off of his venogram that he could have a for staged left basilic vein fistula creation.  The patient has multiple questions regarding his options.  He would prefer to leave his catheter in.  He has had a thigh graft in the past and refuses another thigh graft.  The patient has multiple medical issues including coronary artery disease, peripheral vascular disease, status post amputation, hypercholesterolemia, hypertension.  He is on Effient as well.  The patient does not have symptoms concerning for COVID-19 infection (fever, chills, cough, or new shortness of breath).   PAST MEDICAL HISTORY    Past Medical History:  Diagnosis Date  . Anal fissure   . Anemia of chronic disease   . Arthritis    "hands, right knee" (03/19/2014) no cartlidge  in right knee  . CAD (coronary artery disease)    a. Abnl nuc ->LHC (11/15):  mid to dist Dx 80%, mid RCA 99% (functional CTO), dist RCA 80% >> PCI: balloon angioplasty to mid RCA (could not deliver stent) - staged PCI Rotoblator atherectomy/DES to [facility]. b. NSTEMI 12/2015 s/p DES to diagonal.  . Cholecystitis    a. 08/27/2011  . Chronic diastolic CHF (congestive heart failure) ([place])    a. Echo 3/13:  EF 55-60%;  b. Echo (11/15):  Mild LVH, EF 60-65%, no RWMA, Gr 1 DD, MAC, mild LAE, normal RVF, mild RAE;  c.  Echo 5/16:  severe LVH, EF 55-60%, no RWMA, Gr 1 DD, MAC, mild LAE  . Claustrophobia    when things get around his face.   . Complication of anesthesia   . Difficult intubation    only once in 2015 at [facility] haven't had a problem since then  . Diverticulosis   . Esophageal dysmotility   . ESRD (end stage renal disease) ([place])    a. 1995 s/p cadaveric transplant w/ susbequent failure after 18 yrs;  b.Dialysis initiated 07/2011 [facility] M-W-F  . Fibromyalgia   . Gastroparesis   . GERD (gastroesophageal reflux disease)   . Gout    PMH  . History of blood transfusion   . HLD (hyperlipidemia)   . Hypertension   . Hypothyroidism   . Inguinal lymphadenopathy    a. bilateral - s/p biopsy 07/2011  .  Insulin dependent diabetes mellitus (HCC)    Type I  . Monilial esophagitis ([place])   . Morbid obesity ([place])   . OSA (obstructive sleep apnea)    adjustable bed  . PAD (peripheral artery disease) (HCC)    a. s/p L BKA.  [name] Kitchen Pericardial effusion    a.  Small by CT 08/22/11;  b.  Large by CT 08/27/11. c. Not seen on 11/2015 echo.  . Pneumonia ~ 2007  . PONV (postoperative nausea and vomiting)    vomited once after a procedure.  . T1DM (type 1 diabetes mellitus) ([place])   . Tracheobronchomalacia      FAMILY HISTORY   Family History  Problem Relation Age of Onset  . Colon polyps Father   . Diabetes Father   . Hypertension Father   . Hypertension Mother   . Diabetes Mother   .  Hyperlipidemia Mother   . Breast cancer Maternal Aunt   . Bone cancer Maternal Aunt   . Liver cancer Maternal Aunt   . Malignant hyperthermia Neg Hx   . Heart attack Neg Hx   . Stroke Neg Hx   . Colon cancer Neg Hx   . Stomach cancer Neg Hx   . Pancreatic cancer Neg Hx   . Esophageal cancer Neg Hx     SOCIAL HISTORY:   Social History   Socioeconomic History  . Marital status: Married    Spouse name: Not on file  . Number of children: Not on file  . Years of education: Not on file  . Highest education level: Not on file  Occupational History  . Occupation: Disabled  Social Needs  . Financial resource strain: Not on file  . Food insecurity    Worry: Not on file    Inability: Not on file  . Transportation needs    Medical: Not on file    Non-medical: Not on file  Tobacco Use  . Smoking status: Never Smoker  . Smokeless tobacco: Never Used  Substance and Sexual Activity  . Alcohol use: Yes    Comment: occasional  . Drug use: No  . Sexual activity: Not on file  Lifestyle  . Physical activity    Days per week: Not on file    Minutes per session: Not on file  . Stress: Not on file  Relationships  . Social Herbalist on phone: Not on file    Gets together: Not on file    Attends religious service: Not on file    Active member of club or organization: Not on file    Attends meetings of clubs or organizations: Not on file    Relationship status: Not on file  . Intimate partner violence    Fear of current or ex partner: Not on file    Emotionally abused: Not on file    Physically abused: Not on file    Forced sexual activity: Not on file  Other Topics Concern  . Not on file  Social History Narrative   Lives @ home with his wife in [place].     ALLERGIES:    Allergies  Allergen Reactions  . Iodinated Diagnostic Agents Itching, Rash and Other (See Comments)    Systemic itching and transient rash appearance within hours of receiving contrast     . Prednisone Other (See Comments)    Increases sugar levels  . Adhesive [Tape] Itching and Other (See Comments)    Blisters on arm from adhesive tape at dialysis   .  Amoxicillin Itching, Rash and Other (See Comments)    Has patient had a PCN reaction causing immediate rash, facial/tongue/throat swelling, SOB or lightheadedness with hypotension: Yes Has patient had a PCN reaction causing severe rash involving mucus membranes or skin necrosis: No Has patient had a PCN reaction that required hospitalization No Has patient had a PCN reaction occurring within the last 10 years: No If all of the above answers are "NO", then may proceed with Cephalosporin use.  . Clindamycin/Lincomycin Hives and Itching  . Enalapril Rash and Cough    Cough  . Mobic [Meloxicam] Hives and Rash  . Morphine Hives, Nausea Only and Rash  . Vancomycin Hives  . Brilinta [Ticagrelor] Nausea And Vomiting  . Ciprofloxacin Itching and Rash  . Clopidogrel Rash  . Codeine Hives, Itching and Rash  . Doxycycline Rash  . Hydrocodone Rash  . Levofloxacin Hives and Itching  . Oxycodone Rash  . Rocephin [Ceftriaxone] Itching    CURRENT MEDICATIONS:    Current Outpatient Medications  Medication Sig Dispense Refill  . acetaminophen (TYLENOL) 500 MG tablet Take 1,000 mg by mouth at bedtime.    [name] Kitchen allopurinol (ZYLOPRIM) 100 MG tablet Take 2 tablets (200 mg total) by mouth daily. 180 tablet 3  . aspirin EC 81 MG tablet Take 81 mg by mouth at bedtime.    [name] Kitchen atorvastatin (LIPITOR) 10 MG tablet Take 10 mg by mouth daily.     . calcium acetate (PHOSLO) 667 MG capsule Take 1,334 mg by mouth 3 (three) times daily with meals.     . cetirizine (ZYRTEC) 10 MG tablet Take 10 mg by mouth daily.    . diphenhydrAMINE (BENADRYL) 25 MG tablet Take 50 mg by mouth at bedtime.     . docusate sodium (COLACE) 100 MG capsule Take 100 mg by mouth 2 (two) times daily.     [name] Kitchen esomeprazole (NEXIUM) 20 MG capsule Take 20 mg by mouth daily at 12 noon.     . famotidine (PEPCID) 20 MG tablet Take 20 mg by mouth daily as needed for heartburn or indigestion.     . fluticasone (FLONASE) 50 MCG/ACT nasal spray Place 2 sprays into both nostrils daily as needed for allergies.     [name] Kitchen gabapentin (NEURONTIN) 300 MG capsule Take 300 mg by mouth at bedtime.    [name] Kitchen GLUCAGON EMERGENCY 1 MG injection Inject 1 mg into the muscle daily as needed (low blood sugar).     . hydrocortisone 2.5 % ointment Apply 1 application topically 2 (two) times daily as needed (itching).    [name] Kitchen HYDROmorphone (DILAUDID) 2 MG tablet Take 2 mg by mouth 2 (two) times daily.     . hydrOXYzine (ATARAX/VISTARIL) 10 MG tablet Take 10 mg by mouth 3 (three) times daily as needed for itching.    . insulin lispro (HUMALOG) 100 UNIT/ML injection Inject 75-120 Units into the skin continuous. Per insulin pump    . levothyroxine (SYNTHROID, LEVOTHROID) 112 MCG tablet Take 112 mcg by mouth at bedtime.     . Melatonin 5 MG TABS Take 5 mg by mouth at bedtime.    . metoCLOPramide (REGLAN) 10 MG tablet Take 10 mg by mouth 4 (four) times daily -  before meals and at bedtime.     . midodrine (PROAMATINE) 10 MG tablet Take 1 tablet (10 mg total) by mouth 3 (three) times daily. Takes 1 tab in mornings of dialysis and takes 2 tabs 30 mins before starting dialysis (m,w,f)    . multivitamin (  RENA-VIT) TABS tablet Take 1 tablet by mouth daily.    . nitroGLYCERIN (NITROSTAT) 0.4 MG SL tablet Place 1 tablet (0.4 mg total) under the tongue every 5 (five) minutes as needed for chest pain. 25 tablet 4  . Nutritional Supplements (FEEDING SUPPLEMENT, NEPRO CARB STEADY,) LIQD Take 237 mLs by mouth daily. (Patient taking differently: Take 237 mLs by mouth 3 (three) times a week. ) 30 Can 0  . nystatin (NYSTATIN) powder Apply 1 g topically daily as needed (yeast).    Vladimir Faster Glycol-Propyl Glycol (SYSTANE) 0.4-0.3 % SOLN Place 1 drop into both eyes at bedtime.     . polyethylene glycol (MIRALAX / GLYCOLAX) packet Take 17 g  by mouth daily. (Patient taking differently: Take 17 g by mouth daily as needed for mild constipation. ) 14 each 0  . prasugrel (EFFIENT) 10 MG TABS tablet TAKE 1 TABLET BY MOUTH EVERY DAY (Patient taking differently: Take 10 mg by mouth daily. ) 90 tablet 0  . promethazine (PHENERGAN) 25 MG tablet Take 25 mg by mouth every 6 (six) hours as needed for nausea or vomiting.    . sodium chloride (MURO 128) 5 % ophthalmic ointment Place 1 drop into both eyes at bedtime.     [name] Kitchen testosterone cypionate (DEPOTESTOSTERONE CYPIONATE) 200 MG/ML injection Inject 100 mg into the muscle every 14 (fourteen) days.   1  . VELPHORO 500 MG chewable tablet Chew 500-1,000 mg by mouth See admin instructions. Chew 1,000 mg by mouth three times a day with meals and 500 mg twice daily with snacks  10  . rOPINIRole (REQUIP) 2 MG tablet Take 1 tablet (2 mg total) by mouth at bedtime. Take 2 mg by mouth every night at bedtime 30 tablet 0   No current facility-administered medications for this visit.     REVIEW OF SYSTEMS:   Please see the history of present illness.     All other systems reviewed and are negative.  PHYSICAL EXAM:   There were no vitals filed for this visit.  GENERAL: The patient is a well-nourished male, in no acute distress.  PSYCHIATRIC: The patient has a normal affect.   Recent Labs: 08/01/2018: ALT 15 08/02/2018: Magnesium 2.7; Platelets 232; TSH 3.816 10/16/2018: BUN 91; Creatinine, Ser 9.50; Hemoglobin 15.0; Potassium 5.5; Sodium 133   Recent Lipid Panel Lab Results  Component Value Date/Time   CHOL 143 02/25/2015 02:00 PM   TRIG 255 (H) 02/25/2015 02:00 PM   HDL 29 (L) 02/25/2015 02:00 PM   CHOLHDL 4.9 02/25/2015 02:00 PM   LDLCALC 63 02/25/2015 02:00 PM    Wt Readings from Last 3 Encounters:  10/16/18 280 lb (127 kg)  10/11/18 285 lb (129.3 kg)  08/03/18 285 lb 4.4 oz (129.4 kg)     STUDIES:   None  ASSESSMENT and PLAN   We discussed proceeding with a first stage left  basilic vein fistula creation.  He will need to be off of his Effient.  He is scheduled for this Thursday.   Time:   Today, I have spent 12 minutes with the patient with telehealth technology discussing the above problems.        Leia Alf, MD, FACS [facility] [phone] Pager [phone]

## 2018-10-30 ENCOUNTER — Encounter (HOSPITAL_COMMUNITY): Payer: Self-pay | Admitting: *Deleted

## 2018-10-31 ENCOUNTER — Encounter (HOSPITAL_COMMUNITY): Payer: Self-pay | Admitting: *Deleted

## 2018-10-31 ENCOUNTER — Other Ambulatory Visit: Payer: Self-pay

## 2018-10-31 DIAGNOSIS — T82818A Embolism of vascular prosthetic devices, implants and grafts, initial encounter: Secondary | ICD-10-CM | POA: Diagnosis not present

## 2018-10-31 DIAGNOSIS — D689 Coagulation defect, unspecified: Secondary | ICD-10-CM | POA: Diagnosis not present

## 2018-10-31 DIAGNOSIS — Z4931 Encounter for adequacy testing for hemodialysis: Secondary | ICD-10-CM | POA: Diagnosis not present

## 2018-10-31 DIAGNOSIS — N2581 Secondary hyperparathyroidism of renal origin: Secondary | ICD-10-CM | POA: Diagnosis not present

## 2018-10-31 DIAGNOSIS — L299 Pruritus, unspecified: Secondary | ICD-10-CM | POA: Diagnosis not present

## 2018-10-31 DIAGNOSIS — E1022 Type 1 diabetes mellitus with diabetic chronic kidney disease: Secondary | ICD-10-CM | POA: Diagnosis not present

## 2018-10-31 DIAGNOSIS — N186 End stage renal disease: Secondary | ICD-10-CM | POA: Diagnosis not present

## 2018-10-31 DIAGNOSIS — D631 Anemia in chronic kidney disease: Secondary | ICD-10-CM | POA: Diagnosis not present

## 2018-10-31 MED ORDER — VANCOMYCIN HCL 10 G IV SOLR
1500.0000 mg | INTRAVENOUS | Status: AC
  Administered 2018-11-01: 1500 mg via INTRAVENOUS
  Filled 2018-10-31: qty 1500

## 2018-10-31 NOTE — Progress Notes (Signed)
  Chest x-ray - 08/02/18 EKG - 08/11/18 Stress Test - 2015 ECHO - 05/04/16 Cardiac Cath - 01/01/16  Sleep Study - 2017 CPAP - No CPAP but wears 3 L o2 at night  Fasting Blood Sugar -  Checks Blood Sugar _several____ times a day Patient is on insulin pump last a1c on 08/08/18 8.2 Instructed patient to reduce basal rate by 20% at midnight, check his sugar the morning of procedure and treat if below 70 with apple or cranberry and recheck 15 minutes laster  Blood Thinner Instructions: last dose of Effient on 10/26/18 Aspirin Instructions: continue  Anesthesia review: yes, due to difficult airway, allison PA made aware  Patient denies shortness of breath, fever, cough and chest pain at PAT appointment   Patient verbalized understanding of instructions that were given to them at the PAT appointment. Patient was also instructed that they will need to review over the PAT instructions again at home before surgery.

## 2018-10-31 NOTE — Anesthesia Preprocedure Evaluation (Addendum)
Anesthesia Evaluation  Patient identified by MRN, date of birth, ID band Patient awake    Reviewed: Allergy & Precautions, NPO status , Patient's Chart, lab work & pertinent test results  History of Anesthesia Complications (+) PONV, DIFFICULT AIRWAY and history of anesthetic complications  Airway Mallampati: II  TM Distance: >3 FB Neck ROM: Full    Dental no notable dental hx. (+) Caps,    Pulmonary sleep apnea ,    Pulmonary exam normal        Cardiovascular hypertension, + CAD, + Past MI, + Cardiac Stents (s/p DES RCA 04/09/14; NSTEMI s/p DES D1 01/01/16), + Peripheral Vascular Disease (s/p left BKA), +CHF and + DOE  Normal cardiovascular exam  TTE 10/2016: moderate LVH, EF 00-92%, grade 1 diastolic   Dysfunction, mild TR, PASP 39 mmHg    Neuro/Psych Anxiety negative neurological ROS     GI/Hepatic GERD  Medicated and Controlled,  Endo/Other  diabetes, Type 1, Insulin DependentHypothyroidism   Renal/GU Dialysis and ESRFRenal disease (HD M/W/F)     Musculoskeletal  (+) Arthritis , Osteoarthritis,  Fibromyalgia -, narcotic dependent  Abdominal   Peds  Hematology negative hematology ROS (+)   Anesthesia Other Findings   Reproductive/Obstetrics                           Anesthesia Physical Anesthesia Plan  ASA: III  Anesthesia Plan: MAC   Post-op Pain Management:    Induction:   PONV Risk Score and Plan: 2 and Treatment may vary due to age or medical condition, Propofol infusion and Ondansetron  Airway Management Planned: Natural Airway and Simple Face Mask  Additional Equipment:   Intra-op Plan:   Post-operative Plan:   Informed Consent: I have reviewed the patients History and Physical, chart, labs and discussed the procedure including the risks, benefits and alternatives for the proposed anesthesia with the patient or authorized representative who has indicated his/her  understanding and acceptance.     Dental advisory given  Plan Discussed with: CRNA  Anesthesia Plan Comments: (PAT note written 10/31/2018 by Myra Gianotti, PA-C. SAME DAY WORK-UP   )      Anesthesia Quick Evaluation

## 2018-10-31 NOTE — Progress Notes (Signed)
Anesthesia Chart Review: Dale [name]   Case: [id number] Date/Time: 11/01/18 0931   Procedure: FIRST STAGE BASILIC VEIN TRANSPOSITION LEFT ARM (Left )   Anesthesia type: Monitor Anesthesia Care   Pre-op diagnosis: END STAGE RENAL DISEASE FOR HEMODIALYSIS ACCESS   Location: [facility] OR ROOM 12 / [place] [facility]   Surgeon: Serafina Mitchell, MD      DISCUSSION: Patient is a 53 year old male scheduled for the above procedure.  History includes DIFFICULT INTUBATION, ESRD (s/p renal transplant '95, with failure ~ 2013 with HD MWF; s/p multiple HD access procedures, last thrombectomy of left brachiocephalic AVF, right IJ [facility] 08/02/18), CAD (s/p DES RCA 04/09/14; NSTEMI s/p DES D1 01/01/16), PAD (s/p left BKA '10), DM1 (on insulin pump, OmniPod pump and uses Freestyle Libre CGMS), hypothyroidism, anemia, fibromyalgia, hypercholesterolemia, OSA (can't tolerate CPAP due to claustrophobia, but wears O2 3L at night), GERD. In 2015, he was evaluated at [facility] for consideration of right first rib resection and right subclavian venoplasty due to ongoing thoracic outlet syndrome and outflow stenosis of his right brachiocephalic fistula. However, venogram was performed in 10/2013 and his right SCV was occluded and unable to be revascularized (see Care Everywhere).  Date of Surgery Airway Details  08/02/18 Induction Type: IV induction and Rapid sequence Laryngoscope Size: Glidescope and 4 Grade View: Grade I Tube type: Oral Tube size: 7.5 mm Number of attempts: 1 Airway Equipment and Method: Stylet and Video-laryngoscopy Difficulty Due To: Difficulty was anticipated, Difficult Airway- due to large tongue and Difficult Airway-  due to edematous airway Future Recommendations: Recommend- induction with short-acting agent, and alternative techniques readily available  08/09/16 4.0 LMA, bite block  09/01/15 MAC, natural airway   04/30/15 Difficulty was anticipated; Grade 1 (with glidescope); CRNA; Endotracheal Tube; MAC; Oral; 7.5 mm;  Cuffed, Min.occ.pres., Air; 1; Stylet, Secondary school teacher; Direct Visualization  01/20/15 Intubation Type: IV induction Ventilation: Oral airway inserted - appropriate to patient size, Two handed mask ventilation required and Mask ventilation without difficulty Laryngoscope Size: Glidescope and 4 (for anticipated difficulty) Grade View: Grade I Tube type: Oral Tube size: 7.5 mm Number of attempts: 1 Airway Equipment and Method: Stylet  11/19/14 Intubation Type: IV induction and Rapid sequence Laryngoscope Size: Glidescope and 4 Grade View: Grade I Tube type: Oral Tube size: 7.5 mm Number of attempts: 1 Airway Equipment and Method: Stylet and Video-laryngoscopy   Patient last seen by cardiology 06/2017. He tolerated hemodialysis access procedure just a few months ago. He denied shortness of breath, cough, fever, and chest pain per PAT RN interview. Reports last Effient 10/26/18. He is for anesthesia evaluation and labs on the day of surgery.    PROVIDERS:  Bonnita Hollow, MD is listed as PCP ([facility]) Lauree Chandler, MD is cardiologist. Last visit 07/06/17. Continue medical therapy. Patient using midodrine for hypotension on HD days. Alton Revere, MD is endocrinologist. Last visit 08/07/18 ([place]) Hermelinda Medicus, MD is rheumatologist Nephrologist is through [facility]: He is for labs on the day of surgery. A1c 8.2 on 08/08/18 [facility] Endocrinology).   IMAGES: 1V PCXR 08/02/18: IMPRESSION: Central catheter tip in superior vena cava. No pneumothorax. Mild left base atelectasis. No edema or consolidation. Stable cardiac prominence.   EKG: 08/01/18: Sinus rhythm Low voltage, precordial leads no acute ST/T changes no significant change since July 29 2018 Confirmed by Sherwood Gambler [phone]) on 08/02/2018 8:21:46 AM   CV: Echo 11/01/16: Study Conclusions - Left ventricle: The cavity  size was normal. Wall  thickness was   increased in a pattern of moderate LVH. Systolic function was   normal. The estimated ejection fraction was in the range of 60%   to 65%. Wall motion was normal; there were no regional wall   motion abnormalities. Doppler parameters are consistent with   abnormal left ventricular relaxation (grade 1 diastolic   dysfunction). The E/e&' ratio is >15, suggesting elevated LV   filling pressure. - Mitral valve: Calcified annulus. Mildly thickened leaflets .   There was trivial regurgitation. - Left atrium: The atrium was normal in size. - Tricuspid valve: There was mild regurgitation. - Pulmonary arteries: PA peak pressure: 39 mm Hg (S). - Inferior vena cava: The vessel was normal in size. The   respirophasic diameter changes were in the normal range (>= 50%),   consistent with normal central venous pressure. Impressions: - LVEF 60-65%, moderate LVH, normal wall motion, grade 1 DD with   elevated LV filling pressure, trivial MR, mild TR, RVSP 39 mmHg,   normal IVC.  Cardiac cath 01/01/16:  Ost LAD lesion, 30 %stenosed.  Dist LAD lesion, 30 %stenosed.  Prox RCA to Mid RCA lesion, 0 %stenosed.  1st RPLB lesion, 50 %stenosed.  A STENT XIENCE ALPINE RX [id number] drug eluting stent was successfully placed.  1st Diag lesion, 95 %stenosed.  Post intervention, there is a 0% residual stenosis.  Dist RCA lesion, 30 %stenosed. - Two-vessel coronary obstructive disease with mild calcification of the proximal LAD with 30% narrowing, progressive 95% mid diagonal stenosis, 30% mid LAD narrowing; normal left circumflex coronary artery; and calcified large RCA with a widely patent stent in the mid RCA at the site of previous atherectomy, 30% acute margin narrowing, and 50% inferior branch PDA stenosis. - Successful PCI to the diagonal vessel with ultimate insertion of a 2.2515 mm Xience Alpine DES stent postdilated with a 2.4 mm with the 95% stenosis being reduced to  0%. RECOMMENDATION: The patient will continue dual antiplatelet therapy with aspirin and Brilinta.  Medical therapy for concomitant CAD.  Carotid US 04/09/15: IMPRESSION: 1. No evidence of significant atherosclerotic plaque or stenosis. 2. Vertebral arteries are patent with normal antegrade flow. 3. Intermittently irregular cardiac rhythm.   Past Medical History:  Diagnosis Date  . Anal fissure   . Anemia of chronic disease   . Arthritis    "hands, right knee" (03/19/2014) no cartlidge in right knee  . CAD (coronary artery disease)    a. Abnl nuc ->LHC (11/15):  mid to dist Dx 80%, mid RCA 99% (functional CTO), dist RCA 80% >> PCI: balloon angioplasty to mid RCA (could not deliver stent) - staged PCI Rotoblator atherectomy/DES to [facility]. b. NSTEMI 12/2015 s/p DES to diagonal.  . Cholecystitis    a. 08/27/2011  . Chronic diastolic CHF (congestive heart failure) ([place])    a. Echo 3/13:  EF 55-60%;  b. Echo (11/15):  Mild LVH, EF 60-65%, no RWMA, Gr 1 DD, MAC, mild LAE, normal RVF, mild RAE;  c.  Echo 5/16:  severe LVH, EF 55-60%, no RWMA, Gr 1 DD, MAC, mild LAE  . Claustrophobia    when things get around his face.   . Complication of anesthesia   . Difficult intubation    only once in 2015 at [facility] haven't had a problem since then  . Diverticulosis   . Esophageal dysmotility   . ESRD (end stage renal disease) ([place])    a. 1995 s/p cadaveric transplant w/ susbequent  failure after 18 yrs;  b.Dialysis initiated 07/2011 [facility] M-W-F  . Fibromyalgia   . Gastroparesis   . GERD (gastroesophageal reflux disease)   . Gout    PMH  . History of blood transfusion   . HLD (hyperlipidemia)   . Hypertension   . Hypothyroidism   . Inguinal lymphadenopathy    a. bilateral - s/p biopsy 07/2011  . Insulin dependent diabetes mellitus (HCC)    Type I  . Monilial esophagitis ([place])   . Morbid obesity ([place])   . OSA (obstructive sleep apnea)    adjustable bed  . PAD (peripheral artery  disease) (HCC)    a. s/p L BKA.  [name] Kitchen Pericardial effusion    a.  Small by CT 08/22/11;  b.  Large by CT 08/27/11. c. Not seen on 11/2015 echo.  . Pneumonia ~ 2007  . PONV (postoperative nausea and vomiting)    vomited once after a procedure.  . T1DM (type 1 diabetes mellitus) ([place])   . Tracheobronchomalacia     Past Surgical History:  Procedure Laterality Date  . A/V FISTULAGRAM Left 07/26/2016   Procedure: A/V Fistulagram;  Surgeon: Waynetta Sandy, MD;  Location: [place] CV LAB;  Service: Cardiovascular;  Laterality: Left;  . A/V FISTULAGRAM N/A 07/11/2017   Procedure: A/V FISTULAGRAM - Left AV;  Surgeon: Waynetta Sandy, MD;  Location: [place] CV LAB;  Service: Cardiovascular;  Laterality: N/A;  . AMPUTATION OF REPLICATED TOES Right   . ANGIOPLASTY  04/09/2014   RCA   . APPENDECTOMY  ~ 2006  . ARTERIOVENOUS GRAFT PLACEMENT Left 1993?   forearm  . ARTERIOVENOUS GRAFT PLACEMENT Right 1993?   leg  . ARTERIOVENOUS GRAFT PLACEMENT Left    Thigh  . Arteriovenous Graft Removed Left    Thigh  . AV FISTULA PLACEMENT  07/29/2011   Procedure: ARTERIOVENOUS (AV) FISTULA CREATION;  Surgeon: Mal Misty, MD;  Location: [place];  Service: Vascular;  Laterality: Right;  Brachial cephalic  . AV FISTULA PLACEMENT Left 01/20/2015   Procedure: LIGATION OF RIGHT ARTERIOVENOUS FISTULA WITH EXCISION OF ANEURYSM;  Surgeon: Angelia Mould, MD;  Location: [place];  Service: Vascular;  Laterality: Left;  . AV FISTULA PLACEMENT Left 04/30/2015   Procedure: CREATION OF LEFT ARM BRACHIO-CEPHALIC ARTERIOVENOUS (AV) FISTULA ;  Surgeon: Angelia Mould, MD;  Location: [place];  Service: Vascular;  Laterality: Left;  . AV Graft PLACEMENT Left 1993?   "attempted one in my wrist; didn't take"  . BELOW KNEE LEG AMPUTATION Left 2010  . CARDIAC CATHETERIZATION  03/19/2014  . CARDIAC CATHETERIZATION  03/19/2014   Procedure: CORONARY BALLOON ANGIOPLASTY;  Surgeon: Burnell Blanks, MD;   Location: [facility] CATH LAB;  Service: Cardiovascular;;  . CARDIAC CATHETERIZATION N/A 01/01/2016   Procedure: Left Heart Cath and Coronary Angiography;  Surgeon: Troy Sine, MD;  Location: [place] CV LAB;  Service: Cardiovascular;  Laterality: N/A;  . CARDIAC CATHETERIZATION N/A 01/01/2016   Procedure: Coronary Stent Intervention;  Surgeon: Troy Sine, MD;  Location: [place] CV LAB;  Service: Cardiovascular;  Laterality: N/A;  . CARPAL TUNNEL RELEASE Bilateral after 2005  . CATARACT EXTRACTION W/ INTRAOCULAR LENS  IMPLANT, BILATERAL Bilateral 1990's  . COLONOSCOPY  08/29/2011   Procedure: COLONOSCOPY;  Surgeon: Jerene Bears, MD;  Location: [place];  Service: Gastroenterology;  Laterality: N/A;  . COLONOSCOPY WITH PROPOFOL N/A 04/26/2016   Procedure: COLONOSCOPY WITH PROPOFOL;  Surgeon: Jerene Bears, MD;  Location: [facility] ENDOSCOPY;  Service: Gastroenterology;  Laterality: N/A;  . CORONARY ANGIOPLASTY WITH STENT PLACEMENT  04/09/2014       ptca/des mid lad   . ESOPHAGOGASTRODUODENOSCOPY N/A 12/25/2015   Procedure: ESOPHAGOGASTRODUODENOSCOPY (EGD);  Surgeon: Mauri Pole, MD;  Location: [facility] ENDOSCOPY;  Service: Endoscopy;  Laterality: N/A;  bedside  . ESOPHAGOGASTRODUODENOSCOPY (EGD) WITH PROPOFOL N/A 04/26/2016   Procedure: ESOPHAGOGASTRODUODENOSCOPY (EGD) WITH PROPOFOL;  Surgeon: Jerene Bears, MD;  Location: [facility] ENDOSCOPY;  Service: Gastroenterology;  Laterality: N/A;  . EYE SURGERY Bilateral    cataract  . FEMORAL ARTERY REPAIR  04/09/2014   ANGIOSEAL  . INSERTION OF DIALYSIS CATHETER  08/01/2011   Procedure: INSERTION OF DIALYSIS CATHETER;  Surgeon: Rosetta Posner, MD;  Location: [place];  Service: Vascular;  Laterality: Right;  insertion of dialysis catheter on right internal jugular vein  . INSERTION OF DIALYSIS CATHETER Right 01/20/2015   Procedure: INSERTION OF DIALYSIS CATHETER - RIGHT INTERNAL JUGULAR ;  Surgeon: Angelia Mould, MD;  Location: [place];  Service: Vascular;   Laterality: Right;  . INSERTION OF DIALYSIS CATHETER Right 08/02/2018   Procedure: Insertion Of Dialysis Catheter;  Surgeon: Angelia Mould, MD;  Location: [facility] OR;  Service: Vascular;  Laterality: Right;  . KIDNEY TRANSPLANT  04/25/1993  . LEFT HEART CATHETERIZATION WITH CORONARY ANGIOGRAM N/A 03/19/2014   Procedure: LEFT HEART CATHETERIZATION WITH CORONARY ANGIOGRAM;  Surgeon: Burnell Blanks, MD;  Location: [facility] CATH LAB;  Service: Cardiovascular;  Laterality: N/A;  . LYMPH NODE BIOPSY  08/10/2011   Procedure: LYMPH NODE BIOPSY;  Surgeon: Merrie Roof, MD;  Location: [place];  Service: General;  Laterality: Left;  left groin lymph biopsy  . NEUROPLASTY / TRANSPOSITION ULNAR NERVE AT ELBOW Left after 2005  . PATCH ANGIOPLASTY Left 08/09/2016   Procedure: LEFT  ARTERIOVENOUS FISTULA PATCH ANGIOPLASTY;  Surgeon: Waynetta Sandy, MD;  Location: [place];  Service: Vascular;  Laterality: Left;  . PERCUTANEOUS CORONARY ROTOBLATOR INTERVENTION (PCI-R) N/A 04/09/2014   Procedure: PERCUTANEOUS CORONARY ROTOBLATOR INTERVENTION (PCI-R);  Surgeon: Burnell Blanks, MD;  Location: [facility] CATH LAB;  Service: Cardiovascular;  Laterality: N/A;  . PERIPHERAL VASCULAR BALLOON ANGIOPLASTY  07/11/2017   Procedure: PERIPHERAL VASCULAR BALLOON ANGIOPLASTY;  Surgeon: Waynetta Sandy, MD;  Location: [place] CV LAB;  Service: Cardiovascular;;  left arm AV fistula  . PERIPHERAL VASCULAR CATHETERIZATION N/A 12/25/2014   Procedure: Upper Extremity Venography;  Surgeon: Conrad Aviston, MD;  Location: [place] CV LAB;  Service: Cardiovascular;  Laterality: N/A;  . PERIPHERAL VASCULAR CATHETERIZATION Left 02/24/2016   Procedure: Fistulagram;  Surgeon: Waynetta Sandy, MD;  Location: [place] CV LAB;  Service: Cardiovascular;  Laterality: Left;  . PERIPHERAL VASCULAR CATHETERIZATION Left 02/24/2016   Procedure: Peripheral Vascular Intervention;  Surgeon: Waynetta Sandy, MD;   Location: [place] CV LAB;  Service: Cardiovascular;  Laterality: Left;  AV FISTULA  . PERITONEAL CATHETER INSERTION    . PERITONEAL CATHETER REMOVAL    . REVISON OF ARTERIOVENOUS FISTULA Right 4/81/8563   Procedure: PLICATION / REVISION OF ARTERIOVENOUS FISTULA;  Surgeon: Angelia Mould, MD;  Location: [place];  Service: Vascular;  Laterality: Right;  . REVISON OF ARTERIOVENOUS FISTULA Left 09/01/2015   Procedure: SUPERFICIALIZATION OF LEFT ARM BRACHIOCEPHALIC ARTERIOVENOUS FISTULA;  Surgeon: Angelia Mould, MD;  Location: [place];  Service: Vascular;  Laterality: Left;  . REVISON OF ARTERIOVENOUS FISTULA Left 08/09/2016   Procedure: REVISION OF ARTERIAL ANASTOMOSIS OF ARTERIOVENOUS FISTULA;  Surgeon: Waynetta Sandy, MD;  [facility];  Service: Vascular;  Laterality: Left;  . SHOULDER ARTHROSCOPY Left ~ 2006   frozen  . THROMBECTOMY W/ EMBOLECTOMY Left 08/02/2018   Procedure: THROMBECTOMY ARTERIOVENOUS FISTULA;  Surgeon: Angelia Mould, MD;  Location: [place];  Service: Vascular;  Laterality: Left;  . TOE AMPUTATION Bilateral after 2005  . UMBILICAL HERNIA REPAIR  1990's  . UPPER EXTREMITY VENOGRAPHY Bilateral 10/16/2018   Procedure: UPPER EXTREMITY VENOGRAPHY;  Surgeon: Serafina Mitchell, MD;  Location: [place] CV LAB;  Service: Cardiovascular;  Laterality: Bilateral;  . VENOGRAM Right 07/28/2011   Procedure: VENOGRAM;  Surgeon: Conrad Portage, MD;  Location[facility] CATH LAB;  Service: Cardiovascular;  Laterality: Right;  . VITRECTOMY Bilateral     MEDICATIONS: . [START ON 11/01/2018] vancomycin (VANCOCIN) 1,500 mg in sodium chloride 0.9 % 500 mL IVPB   . acetaminophen (TYLENOL) 500 MG tablet  . allopurinol (ZYLOPRIM) 100 MG tablet  . aspirin EC 81 MG tablet  . atorvastatin (LIPITOR) 10 MG tablet  . calcium acetate (PHOSLO) 667 MG capsule  . cetirizine (ZYRTEC) 10 MG tablet  . diphenhydrAMINE (BENADRYL) 25 MG tablet  . docusate sodium (COLACE) 100 MG capsule  .  esomeprazole (NEXIUM) 20 MG capsule  . famotidine (PEPCID) 20 MG tablet  . fluticasone (FLONASE) 50 MCG/ACT nasal spray  . gabapentin (NEURONTIN) 300 MG capsule  . GLUCAGON EMERGENCY 1 MG injection  . hydrocortisone 2.5 % ointment  . HYDROmorphone (DILAUDID) 2 MG tablet  . hydrOXYzine (ATARAX/VISTARIL) 10 MG tablet  . insulin lispro (HUMALOG) 100 UNIT/ML injection  . levothyroxine (SYNTHROID, LEVOTHROID) 112 MCG tablet  . Melatonin 5 MG TABS  . metoCLOPramide (REGLAN) 10 MG tablet  . midodrine (PROAMATINE) 10 MG tablet  . multivitamin (RENA-VIT) TABS tablet  . nitroGLYCERIN (NITROSTAT) 0.4 MG SL tablet  . Nutritional Supplements (FEEDING SUPPLEMENT, NEPRO CARB STEADY,) LIQD  . nystatin (NYSTATIN) powder  . Polyethyl Glycol-Propyl Glycol (SYSTANE) 0.4-0.3 % SOLN  . polyethylene glycol (MIRALAX / GLYCOLAX) packet  . prasugrel (EFFIENT) 10 MG TABS tablet  . promethazine (PHENERGAN) 25 MG tablet  . rOPINIRole (REQUIP) 2 MG tablet  . sodium chloride (MURO 128) 5 % ophthalmic ointment  . testosterone cypionate (DEPOTESTOSTERONE CYPIONATE) 200 MG/ML injection  . VELPHORO 500 MG chewable tablet    Myra Gianotti, PA-C Surgical Short Stay/Anesthesiology [facility] Phone [phone] [facility] Phone [phone] 10/31/2018 3:59 PM

## 2018-11-01 ENCOUNTER — Encounter (HOSPITAL_COMMUNITY): Payer: Self-pay

## 2018-11-01 ENCOUNTER — Ambulatory Visit (HOSPITAL_COMMUNITY): Payer: Medicare Other | Admitting: Vascular Surgery

## 2018-11-01 ENCOUNTER — Ambulatory Visit (HOSPITAL_COMMUNITY)
Admission: RE | Admit: 2018-11-01 | Discharge: 2018-11-01 | Disposition: A | Discharge status: Home / Self Care | Payer: Medicare Other | Attending: Surgery | Admitting: Surgery

## 2018-11-01 ENCOUNTER — Other Ambulatory Visit: Payer: Self-pay

## 2018-11-01 ENCOUNTER — Encounter (HOSPITAL_COMMUNITY)
Admission: RE | Disposition: A | Discharge status: Home / Self Care | Payer: Self-pay | Source: Home / Self Care | Attending: Surgery

## 2018-11-01 DIAGNOSIS — N186 End stage renal disease: Secondary | ICD-10-CM

## 2018-11-01 DIAGNOSIS — Z7982 Long term (current) use of aspirin: Secondary | ICD-10-CM | POA: Insufficient documentation

## 2018-11-01 DIAGNOSIS — Z79890 Hormone replacement therapy (postmenopausal): Secondary | ICD-10-CM | POA: Insufficient documentation

## 2018-11-01 DIAGNOSIS — Z794 Long term (current) use of insulin: Secondary | ICD-10-CM | POA: Diagnosis not present

## 2018-11-01 DIAGNOSIS — Z940 Kidney transplant status: Secondary | ICD-10-CM | POA: Diagnosis not present

## 2018-11-01 DIAGNOSIS — M19041 Primary osteoarthritis, right hand: Secondary | ICD-10-CM | POA: Insufficient documentation

## 2018-11-01 DIAGNOSIS — Z886 Allergy status to analgesic agent status: Secondary | ICD-10-CM | POA: Insufficient documentation

## 2018-11-01 DIAGNOSIS — E1051 Type 1 diabetes mellitus with diabetic peripheral angiopathy without gangrene: Secondary | ICD-10-CM | POA: Insufficient documentation

## 2018-11-01 DIAGNOSIS — Z89512 Acquired absence of left leg below knee: Secondary | ICD-10-CM | POA: Insufficient documentation

## 2018-11-01 DIAGNOSIS — I130 Hypertensive heart and chronic kidney disease with heart failure and stage 1 through stage 4 chronic kidney disease, or unspecified chronic kidney disease: Secondary | ICD-10-CM | POA: Diagnosis not present

## 2018-11-01 DIAGNOSIS — I5032 Chronic diastolic (congestive) heart failure: Secondary | ICD-10-CM | POA: Insufficient documentation

## 2018-11-01 DIAGNOSIS — I132 Hypertensive heart and chronic kidney disease with heart failure and with stage 5 chronic kidney disease, or end stage renal disease: Secondary | ICD-10-CM | POA: Insufficient documentation

## 2018-11-01 DIAGNOSIS — E1043 Type 1 diabetes mellitus with diabetic autonomic (poly)neuropathy: Secondary | ICD-10-CM | POA: Diagnosis not present

## 2018-11-01 DIAGNOSIS — Z1159 Encounter for screening for other viral diseases: Secondary | ICD-10-CM | POA: Diagnosis not present

## 2018-11-01 DIAGNOSIS — E785 Hyperlipidemia, unspecified: Secondary | ICD-10-CM | POA: Diagnosis not present

## 2018-11-01 DIAGNOSIS — E7800 Pure hypercholesterolemia, unspecified: Secondary | ICD-10-CM | POA: Diagnosis not present

## 2018-11-01 DIAGNOSIS — F1120 Opioid dependence, uncomplicated: Secondary | ICD-10-CM | POA: Insufficient documentation

## 2018-11-01 DIAGNOSIS — Z881 Allergy status to other antibiotic agents status: Secondary | ICD-10-CM | POA: Insufficient documentation

## 2018-11-01 DIAGNOSIS — K3184 Gastroparesis: Secondary | ICD-10-CM | POA: Insufficient documentation

## 2018-11-01 DIAGNOSIS — G4733 Obstructive sleep apnea (adult) (pediatric): Secondary | ICD-10-CM | POA: Insufficient documentation

## 2018-11-01 DIAGNOSIS — Z6841 Body Mass Index (BMI) 40.0 and over, adult: Secondary | ICD-10-CM | POA: Insufficient documentation

## 2018-11-01 DIAGNOSIS — M19042 Primary osteoarthritis, left hand: Secondary | ICD-10-CM | POA: Insufficient documentation

## 2018-11-01 DIAGNOSIS — E039 Hypothyroidism, unspecified: Secondary | ICD-10-CM | POA: Diagnosis not present

## 2018-11-01 DIAGNOSIS — Z7902 Long term (current) use of antithrombotics/antiplatelets: Secondary | ICD-10-CM | POA: Diagnosis not present

## 2018-11-01 DIAGNOSIS — E1022 Type 1 diabetes mellitus with diabetic chronic kidney disease: Secondary | ICD-10-CM | POA: Insufficient documentation

## 2018-11-01 DIAGNOSIS — I2510 Atherosclerotic heart disease of native coronary artery without angina pectoris: Secondary | ICD-10-CM | POA: Insufficient documentation

## 2018-11-01 DIAGNOSIS — Z79899 Other long term (current) drug therapy: Secondary | ICD-10-CM | POA: Insufficient documentation

## 2018-11-01 DIAGNOSIS — T82898A Other specified complication of vascular prosthetic devices, implants and grafts, initial encounter: Secondary | ICD-10-CM

## 2018-11-01 DIAGNOSIS — M1711 Unilateral primary osteoarthritis, right knee: Secondary | ICD-10-CM | POA: Insufficient documentation

## 2018-11-01 DIAGNOSIS — E1122 Type 2 diabetes mellitus with diabetic chronic kidney disease: Secondary | ICD-10-CM | POA: Diagnosis not present

## 2018-11-01 DIAGNOSIS — Z91041 Radiographic dye allergy status: Secondary | ICD-10-CM | POA: Insufficient documentation

## 2018-11-01 DIAGNOSIS — Z955 Presence of coronary angioplasty implant and graft: Secondary | ICD-10-CM | POA: Diagnosis not present

## 2018-11-01 DIAGNOSIS — K219 Gastro-esophageal reflux disease without esophagitis: Secondary | ICD-10-CM | POA: Insufficient documentation

## 2018-11-01 DIAGNOSIS — Z885 Allergy status to narcotic agent status: Secondary | ICD-10-CM | POA: Insufficient documentation

## 2018-11-01 DIAGNOSIS — Z888 Allergy status to other drugs, medicaments and biological substances status: Secondary | ICD-10-CM | POA: Insufficient documentation

## 2018-11-01 DIAGNOSIS — Z992 Dependence on renal dialysis: Secondary | ICD-10-CM | POA: Insufficient documentation

## 2018-11-01 DIAGNOSIS — Z8249 Family history of ischemic heart disease and other diseases of the circulatory system: Secondary | ICD-10-CM | POA: Insufficient documentation

## 2018-11-01 DIAGNOSIS — M109 Gout, unspecified: Secondary | ICD-10-CM | POA: Diagnosis not present

## 2018-11-01 DIAGNOSIS — Z833 Family history of diabetes mellitus: Secondary | ICD-10-CM | POA: Insufficient documentation

## 2018-11-01 DIAGNOSIS — M797 Fibromyalgia: Secondary | ICD-10-CM | POA: Insufficient documentation

## 2018-11-01 HISTORY — PX: BASCILIC VEIN TRANSPOSITION: SHX5742

## 2018-11-01 HISTORY — PX: LIGATION OF ARTERIOVENOUS  FISTULA: SHX5948

## 2018-11-01 LAB — GLUCOSE, CAPILLARY
Glucose-Capillary: 263 mg/dL — ABNORMAL HIGH (ref 70–99)
Glucose-Capillary: 276 mg/dL — ABNORMAL HIGH (ref 70–99)
Glucose-Capillary: 250 mg/dL — ABNORMAL HIGH (ref 70–99)

## 2018-11-01 LAB — PROTIME-INR
INR: 1 (ref 0.8–1.2)
Prothrombin Time: 13.4 seconds (ref 11.4–15.2)

## 2018-11-01 LAB — POCT I-STAT 4, (NA,K, GLUC, HGB,HCT)
Glucose, Bld: 300 mg/dL — ABNORMAL HIGH (ref 70–99)
HCT: 38 % — ABNORMAL LOW (ref 39.0–52.0)
Hemoglobin: 12.9 g/dL — ABNORMAL LOW (ref 13.0–17.0)
Potassium: 4.7 mmol/L (ref 3.5–5.1)
Sodium: 133 mmol/L — ABNORMAL LOW (ref 135–145)

## 2018-11-01 LAB — SARS CORONAVIRUS 2 BY RT PCR (HOSPITAL ORDER, PERFORMED IN ~~LOC~~ HOSPITAL LAB): SARS Coronavirus 2: NEGATIVE

## 2018-11-01 SURGERY — TRANSPOSITION, VEIN, BASILIC
Anesthesia: Monitor Anesthesia Care | Site: Arm Upper | Laterality: Left

## 2018-11-01 MED ORDER — GLYCOPYRROLATE 0.2 MG/ML IJ SOLN
INTRAMUSCULAR | Status: DC | PRN
  Administered 2018-11-01: 0.2 mg via INTRAVENOUS

## 2018-11-01 MED ORDER — FENTANYL CITRATE (PF) 100 MCG/2ML IJ SOLN
25.0000 ug | INTRAMUSCULAR | Status: DC | PRN
  Administered 2018-11-01: 25 ug via INTRAVENOUS

## 2018-11-01 MED ORDER — LIDOCAINE-EPINEPHRINE (PF) 1 %-1:200000 IJ SOLN
INTRAMUSCULAR | Status: DC | PRN
  Administered 2018-11-01: 30 mL

## 2018-11-01 MED ORDER — SODIUM CHLORIDE 0.9 % IV SOLN
INTRAVENOUS | Status: DC | PRN
  Administered 2018-11-01: 500 mL

## 2018-11-01 MED ORDER — ONDANSETRON HCL 4 MG/2ML IJ SOLN
INTRAMUSCULAR | Status: DC | PRN
  Administered 2018-11-01: 4 mg via INTRAVENOUS

## 2018-11-01 MED ORDER — FENTANYL CITRATE (PF) 250 MCG/5ML IJ SOLN
INTRAMUSCULAR | Status: AC
  Filled 2018-11-01: qty 5

## 2018-11-01 MED ORDER — CHLORHEXIDINE GLUCONATE 4 % EX LIQD: 60.0000 mL | Freq: Once | CUTANEOUS | Status: DC

## 2018-11-01 MED ORDER — PHENYLEPHRINE HCL-NACL 10-0.9 MG/250ML-% IV SOLN
INTRAVENOUS | Status: AC
  Filled 2018-11-01: qty 250

## 2018-11-01 MED ORDER — KETAMINE HCL 10 MG/ML IJ SOLN
INTRAMUSCULAR | Status: DC | PRN
  Administered 2018-11-01 (×2): 20 mg via INTRAVENOUS

## 2018-11-01 MED ORDER — PROMETHAZINE HCL 25 MG/ML IJ SOLN: 6.2500 mg | INTRAMUSCULAR | Status: DC | PRN

## 2018-11-01 MED ORDER — MIDAZOLAM HCL 2 MG/2ML IJ SOLN
INTRAMUSCULAR | Status: DC | PRN
  Administered 2018-11-01 (×2): 1 mg via INTRAVENOUS

## 2018-11-01 MED ORDER — PROPOFOL 10 MG/ML IV BOLUS
INTRAVENOUS | Status: AC
  Filled 2018-11-01: qty 20

## 2018-11-01 MED ORDER — KETAMINE HCL 50 MG/5ML IJ SOSY
PREFILLED_SYRINGE | INTRAMUSCULAR | Status: AC
  Filled 2018-11-01: qty 5

## 2018-11-01 MED ORDER — FENTANYL CITRATE (PF) 250 MCG/5ML IJ SOLN
INTRAMUSCULAR | Status: DC | PRN
  Administered 2018-11-01 (×8): 25 ug via INTRAVENOUS
  Administered 2018-11-01: 50 ug via INTRAVENOUS

## 2018-11-01 MED ORDER — HEPARIN SODIUM (PORCINE) 1000 UNIT/ML IJ SOLN
INTRAMUSCULAR | Status: DC | PRN
  Administered 2018-11-01: 3000 [IU] via INTRAVENOUS

## 2018-11-01 MED ORDER — SODIUM CHLORIDE 0.9 % IV SOLN
INTRAVENOUS | Status: DC | PRN
  Administered 2018-11-01: 25 ug/min via INTRAVENOUS

## 2018-11-01 MED ORDER — LIDOCAINE HCL (CARDIAC) PF 100 MG/5ML IV SOSY
PREFILLED_SYRINGE | INTRAVENOUS | Status: DC | PRN
  Administered 2018-11-01: 40 mg via INTRAVENOUS

## 2018-11-01 MED ORDER — PROPOFOL 500 MG/50ML IV EMUL
INTRAVENOUS | Status: DC | PRN
  Administered 2018-11-01: 75 ug/kg/min via INTRAVENOUS

## 2018-11-01 MED ORDER — PROPOFOL 1000 MG/100ML IV EMUL
INTRAVENOUS | Status: AC
  Filled 2018-11-01: qty 100

## 2018-11-01 MED ORDER — PROTAMINE SULFATE 10 MG/ML IV SOLN
INTRAVENOUS | Status: DC | PRN
  Administered 2018-11-01: 25 mg via INTRAVENOUS

## 2018-11-01 MED ORDER — HEPARIN SODIUM (PORCINE) 1000 UNIT/ML IJ SOLN
INTRAMUSCULAR | Status: AC
  Filled 2018-11-01: qty 1

## 2018-11-01 MED ORDER — LIDOCAINE 2% (20 MG/ML) 5 ML SYRINGE
INTRAMUSCULAR | Status: AC
  Filled 2018-11-01: qty 5

## 2018-11-01 MED ORDER — SODIUM CHLORIDE 0.9 % IV SOLN
INTRAVENOUS | Status: AC
  Filled 2018-11-01: qty 1.2

## 2018-11-01 MED ORDER — PHENYLEPHRINE 40 MCG/ML (10ML) SYRINGE FOR IV PUSH (FOR BLOOD PRESSURE SUPPORT)
PREFILLED_SYRINGE | INTRAVENOUS | Status: AC
  Filled 2018-11-01: qty 10

## 2018-11-01 MED ORDER — HEMOSTATIC AGENTS (NO CHARGE) OPTIME
TOPICAL | Status: DC | PRN
  Administered 2018-11-01: 1 via TOPICAL

## 2018-11-01 MED ORDER — PROPOFOL 10 MG/ML IV BOLUS
INTRAVENOUS | Status: DC | PRN
  Administered 2018-11-01: 20 mg via INTRAVENOUS

## 2018-11-01 MED ORDER — FENTANYL CITRATE (PF) 100 MCG/2ML IJ SOLN
INTRAMUSCULAR | Status: AC
  Filled 2018-11-01: qty 2

## 2018-11-01 MED ORDER — LIDOCAINE HCL (PF) 1 % IJ SOLN
INTRAMUSCULAR | Status: AC
  Filled 2018-11-01: qty 30

## 2018-11-01 MED ORDER — GLYCOPYRROLATE PF 0.2 MG/ML IJ SOSY
PREFILLED_SYRINGE | INTRAMUSCULAR | Status: AC
  Filled 2018-11-01: qty 1

## 2018-11-01 MED ORDER — MIDAZOLAM HCL 2 MG/2ML IJ SOLN
INTRAMUSCULAR | Status: AC
  Filled 2018-11-01: qty 2

## 2018-11-01 MED ORDER — PHENYLEPHRINE 40 MCG/ML (10ML) SYRINGE FOR IV PUSH (FOR BLOOD PRESSURE SUPPORT)
PREFILLED_SYRINGE | INTRAVENOUS | Status: DC | PRN
  Administered 2018-11-01: 120 ug via INTRAVENOUS

## 2018-11-01 MED ORDER — 0.9 % SODIUM CHLORIDE (POUR BTL) OPTIME
TOPICAL | Status: DC | PRN
  Administered 2018-11-01: 1000 mL

## 2018-11-01 MED ORDER — LIDOCAINE-EPINEPHRINE (PF) 1 %-1:200000 IJ SOLN
INTRAMUSCULAR | Status: AC
  Filled 2018-11-01: qty 30

## 2018-11-01 MED ORDER — SODIUM CHLORIDE 0.9 % IV SOLN
INTRAVENOUS | Status: DC
  Administered 2018-11-01: 08:00:00 via INTRAVENOUS

## 2018-11-01 MED ORDER — ALBUMIN HUMAN 5 % IV SOLN
12.5000 g | Freq: Once | INTRAVENOUS | Status: AC
  Administered 2018-11-01: 13:00:00 12.5 g via INTRAVENOUS

## 2018-11-01 MED ORDER — ACETAMINOPHEN 10 MG/ML IV SOLN: 1000.0000 mg | Freq: Once | INTRAVENOUS | Status: DC | PRN

## 2018-11-01 MED ORDER — PROTAMINE SULFATE 10 MG/ML IV SOLN
INTRAVENOUS | Status: AC
  Filled 2018-11-01: qty 5

## 2018-11-01 MED ORDER — MIDODRINE HCL 5 MG PO TABS
10.0000 mg | ORAL_TABLET | Freq: Once | ORAL | Status: AC
  Administered 2018-11-01: 10 mg via ORAL
  Filled 2018-11-01: qty 2

## 2018-11-01 MED ORDER — ONDANSETRON HCL 4 MG/2ML IJ SOLN
INTRAMUSCULAR | Status: AC
  Filled 2018-11-01: qty 2

## 2018-11-01 MED ORDER — ALBUMIN HUMAN 5 % IV SOLN
INTRAVENOUS | Status: AC
  Administered 2018-11-01: 12.5 g via INTRAVENOUS
  Filled 2018-11-01: qty 250

## 2018-11-01 SURGICAL SUPPLY — 47 items
ADH SKN CLS APL DERMABOND .7 (GAUZE/BANDAGES/DRESSINGS) ×1
ARMBAND PINK RESTRICT EXTREMIT (MISCELLANEOUS) ×2 IMPLANT
CANISTER SUCT 3000ML PPV (MISCELLANEOUS) ×2 IMPLANT
CLIP VESOCCLUDE MED 24/CT (CLIP) ×1 IMPLANT
CLIP VESOCCLUDE SM WIDE 24/CT (CLIP) ×1 IMPLANT
COVER PROBE W GEL 5X96 (DRAPES) ×2 IMPLANT
COVER WAND RF STERILE (DRAPES) ×2 IMPLANT
DERMABOND ADVANCED (GAUZE/BANDAGES/DRESSINGS) ×1
DERMABOND ADVANCED .7 DNX12 (GAUZE/BANDAGES/DRESSINGS) ×1 IMPLANT
DRSG COVADERM 4X6 (GAUZE/BANDAGES/DRESSINGS) ×1 IMPLANT
ELECT REM PT RETURN 9FT ADLT (ELECTROSURGICAL) ×2
ELECTRODE REM PT RTRN 9FT ADLT (ELECTROSURGICAL) ×1 IMPLANT
GLOVE BIO SURGEON STRL SZ 6.5 (GLOVE) ×2 IMPLANT
GLOVE BIOGEL PI IND STRL 6.5 (GLOVE) IMPLANT
GLOVE BIOGEL PI IND STRL 7.0 (GLOVE) IMPLANT
GLOVE BIOGEL PI IND STRL 7.5 (GLOVE) ×1 IMPLANT
GLOVE BIOGEL PI INDICATOR 6.5 (GLOVE) ×1
GLOVE BIOGEL PI INDICATOR 7.0 (GLOVE) ×2
GLOVE BIOGEL PI INDICATOR 7.5 (GLOVE) ×1
GLOVE SS BIOGEL STRL SZ 7 (GLOVE) IMPLANT
GLOVE SUPERSENSE BIOGEL SZ 7 (GLOVE) ×1
GLOVE SURG SS PI 7.5 STRL IVOR (GLOVE) ×2 IMPLANT
GOWN STRL REUS W/ TWL LRG LVL3 (GOWN DISPOSABLE) ×2 IMPLANT
GOWN STRL REUS W/ TWL XL LVL3 (GOWN DISPOSABLE) ×1 IMPLANT
GOWN STRL REUS W/TWL LRG LVL3 (GOWN DISPOSABLE) ×4
GOWN STRL REUS W/TWL XL LVL3 (GOWN DISPOSABLE) ×2
HEMOSTAT SNOW SURGICEL 2X4 (HEMOSTASIS) ×1 IMPLANT
KIT BASIN OR (CUSTOM PROCEDURE TRAY) ×2 IMPLANT
KIT TURNOVER KIT B (KITS) ×2 IMPLANT
NS IRRIG 1000ML POUR BTL (IV SOLUTION) ×2 IMPLANT
PACK CV ACCESS (CUSTOM PROCEDURE TRAY) ×2 IMPLANT
PAD ARMBOARD 7.5X6 YLW CONV (MISCELLANEOUS) ×4 IMPLANT
SPONGE LAP 18X18 RF (DISPOSABLE) ×1 IMPLANT
SPONGE LAP 18X18 X RAY DECT (DISPOSABLE) ×1 IMPLANT
SUT ETHILON 3 0 PS 1 (SUTURE) ×2 IMPLANT
SUT PROLENE 5 0 C 1 24 (SUTURE) ×3 IMPLANT
SUT PROLENE 6 0 BV (SUTURE) ×2 IMPLANT
SUT PROLENE 6 0 CC (SUTURE) ×3 IMPLANT
SUT VIC AB 2-0 CT1 27 (SUTURE) ×2
SUT VIC AB 2-0 CT1 TAPERPNT 27 (SUTURE) IMPLANT
SUT VIC AB 3-0 SH 27 (SUTURE) ×2
SUT VIC AB 3-0 SH 27X BRD (SUTURE) ×1 IMPLANT
SUT VICRYL 4-0 PS2 18IN ABS (SUTURE) ×2 IMPLANT
TOWEL GREEN STERILE (TOWEL DISPOSABLE) ×2 IMPLANT
TUBE CONNECTING 12X1/4 (SUCTIONS) ×1 IMPLANT
UNDERPAD 30X30 (UNDERPADS AND DIAPERS) ×2 IMPLANT
WATER STERILE IRR 1000ML POUR (IV SOLUTION) ×2 IMPLANT

## 2018-11-01 NOTE — Op Note (Signed)
Patient name: Dale [name] MRN: [id number] DOB: [birth year] Sex: male  11/01/2018 Pre-operative Diagnosis: esrd Post-operative diagnosis:  Same Surgeon:  Annamarie Major Assistants: Aldona Bar Ryne Procedure:   #1: Ligation of left brachiocephalic fistula   #2: Left first stage basilic vein fistula creation   #3: Redo exposure of left brachial artery Anesthesia: MAC Blood Loss: 100 cc Specimens: None  Findings: Multiple grafts were identified in the antecubital region.  The tissue was of poor quality.  I ligated the brachiocephalic fistula and performed a for staged left basilic vein fistula creation.  If the basilic vein fistula does not mature, I would recommend a upper arm loop graft to avoid going back into the antecubital region.  Indications: The patient needs new access.  He recently underwent venography which identified a patent central venous system.  He also had ultrasound that identified an adequate basilic vein on the left.  His right central venous system is occluded.  He is here today for for staged basilic vein fistula creation.  Procedure:  The patient was identified in the holding area and taken to [place] [address]  The patient was then placed supine on the table. MAC anesthesia was administered.  The patient was prepped and draped in the usual sterile fashion.  A time out was called and antibiotics were administered.  1% lidocaine was used for local anesthesia.  I use ultrasound to evaluate the basilic vein.  This was a healthy appearing 4 mm vein in the upper arm.  I made an oblique incision just proximal to the antecubital crease.  There was dense scar tissue in this area.  I ultimately dissected out the brachial artery.  There was a forearm loop graft that I also identified as well as a brachiocephalic fistula coming off the brachial artery in close proximity.  I ended up ligating the basilic vein fistula with a 2-0 silk tie on the distal end and 5-0 Prolene at the level of the  brachial artery.  I then exposed the basilic vein.  There was a large branch point in the forearm at the level of the proximal antecubital crease.  I then dissected out the basilic vein.  The nerve was protected.  The vein was very reactive.  Initially it appeared healthy however it spasm down.  It was ultimately about a 3 mm vein.  After transecting the cephalic vein fistula, I heparinized the patient.  I then marked the basilic vein for orientation and ligated distally.  The vein distended to about 3 mm.  I then occluded the brachial artery.  This was somewhat calcified.  I opened it with an 11 blade and extended with it with Potts scissors.  The basilic vein was then sewn in a end-to-side fashion to the brachial artery.  This was done with 6-0 Prolene.  Once this was completed, there was a palpable thrill within the basilic vein and a brisk Doppler signal at the radial artery.  I then achieved hemostasis.  I reapproximated the deep tissue with 2-0 Vicryl.  I closed the skin with running 3-0 nylon.  There were no immediate complications.   Disposition: To PACU stable.  If the basilic vein fistula does not mature, I would recommend a left upper arm loop graft originating in the axilla so as to avoid the antecubital region which has multiple prior surgeries in this area and poor tissue quality.  The brachial artery is also heavily calcified.   Theotis Burrow, M.D., FACS  [facility] Office: [phone] Pager:  [phone]

## 2018-11-01 NOTE — Interval H&P Note (Signed)
History and Physical Interval Note:  11/01/2018 9:44 AM  Dale [name]  has presented today for surgery, with the diagnosis of END STAGE RENAL DISEASE FOR HEMODIALYSIS ACCESS.  The various methods of treatment have been discussed with the patient and family. After consideration of risks, benefits and other options for treatment, the patient has consented to  Procedure(s): FIRST STAGE BASILIC VEIN TRANSPOSITION LEFT ARM (Left) as a surgical intervention.  [name] history has been reviewed, patient examined, no change in status, stable for surgery.  I have reviewed the patient's chart and labs.  Questions were answered to the patient's satisfaction.     Annamarie Major

## 2018-11-01 NOTE — Discharge Instructions (Signed)
[facility]  Discharge Instructions  AV Fistula or Graft Surgery for Dialysis Access  Please refer to the following instructions for your post-procedure care. Your surgeon or physician assistant will discuss any changes with you.  Activity  You may drive the day following your surgery, if you are comfortable and no longer taking prescription pain medication. Resume full activity as the soreness in your incision resolves.  Bathing/Showering  You may shower after you go home. Keep your incision dry for 48 hours. Do not soak in a bathtub, hot tub, or swim until the incision heals completely. You may not shower if you have a hemodialysis catheter.  Incision Care  Clean your incision with mild soap and water after 48 hours. Pat the area dry with a clean towel. You do not need a bandage unless otherwise instructed. Do not apply any ointments or creams to your incision. You may have skin glue on your incision. Do not peel it off. It will come off on its own in about one week. Your arm may swell a bit after surgery. To reduce swelling use pillows to elevate your arm so it is above your heart. Your doctor will tell you if you need to lightly wrap your arm with an ACE bandage.  Diet  Resume your normal diet. There are not special food restrictions following this procedure. In order to heal from your surgery, it is CRITICAL to get adequate nutrition. Your body requires vitamins, minerals, and protein. Vegetables are the best source of vitamins and minerals. Vegetables also provide the perfect balance of protein. Processed food has little nutritional value, so try to avoid this.  Medications  Resume taking all of your medications. If your incision is causing pain, you may take over-the counter pain relievers such as acetaminophen (Tylenol). If you were prescribed a stronger pain medication, please be aware these medications can cause nausea and constipation. Prevent  nausea by taking the medication with a snack or meal. Avoid constipation by drinking plenty of fluids and eating foods with high amount of fiber, such as fruits, vegetables, and grains.  Do not take Tylenol if you are taking prescription pain medications.  RESTART EFFIENT ON 11/02/2018.  Follow up Your surgeon may want to see you in the office following your access surgery. If so, this will be arranged at the time of your surgery.  Please call us immediately for any of the following conditions:  Increased pain, redness, drainage (pus) from your incision site Fever of 101 degrees or higher Severe or worsening pain at your incision site Hand pain or numbness.  Reduce your risk of vascular disease:  Stop smoking. If you would like help, call [facility] at 1-800-QUIT-NOW 878 287 3290) or [place] [facility] [place] your cholesterol Maintain a desired weight Control your diabetes Keep your blood pressure down  Dialysis  It will take several weeks to several months for your new dialysis access to be ready for use. Your surgeon will determine when it is okay to use it. Your nephrologist will continue to direct your dialysis. You can continue to use your Permcath until your new access is ready for use.   11/01/2018 Dale [name] [id number] January 22, 1966  Surgeon(s): Serafina Mitchell, MD  Procedure(s): FIRST STAGE BASILIC VEIN TRANSPOSITION  LEFT ARM Ligation Of  Brachio Cephalic Arteriovenous  Fistula  x Do not stick fistula for 12 weeks    If you have any questions, please call the office at [phone].

## 2018-11-01 NOTE — Progress Notes (Signed)
Pt arrived to PACU by CRNA on stretcher at 12pm. Pt bp low (SBP 80s). B/s via hospital glucometer 250 and personal b/s reading 150 (via home device). Pt alert and alert/oriented X4. PACU RN notified Dr. Daiva Huge. Dr. Daiva Huge gave verbal instructions to continue to monitor patient, if patient alert and oriented X4 n and MAP > 65 to continue to observe. MD notified again due to BP having a MAP of 45. Pt informed MD his bp tends to run low and did not take the home medication Midodrine 10mg  tablet before procedure. MD gave verbal order to administer start Albumin infusion, Phenylephrine drip, IV fluids, and administer home dose of Midodine 10mg . Phenylephrine started at 62mcg/min and RN titrating as needed. Pt is alert and oriented x 4, 2L [place], and RN closely monitoring.

## 2018-11-01 NOTE — Transfer of Care (Signed)
Immediate Anesthesia Transfer of Care Note  Patient: Dale [name]  Procedure(s) Performed: FIRST STAGE BASILIC VEIN TRANSPOSITION  LEFT ARM (Left Arm Upper) Ligation Of  Brachio Cephalic Arteriovenous  Fistula (Left Arm Upper)  Patient Location: PACU  Anesthesia Type:MAC  Level of Consciousness: awake, alert , oriented and patient cooperative  Airway & Oxygen Therapy: Patient Spontanous Breathing and Patient connected to face mask oxygen  Post-op Assessment: Report given to RN, Post -op Vital signs reviewed and stable and Post -op Vital signs reviewed and unstable, Anesthesiologist notified--patient mentating appropriately, but BP low. PACU RN says he will notify [facility].  Post vital signs: Reviewed and stable  Last Vitals:  Vitals Value Taken Time  BP 83/59 11/01/18 1206  Temp    Pulse 93 11/01/18 1207  Resp 22 11/01/18 1207  SpO2 100 % 11/01/18 1207  Vitals shown include unvalidated device data.  Last Pain:  Vitals:   11/01/18 0717  TempSrc:   PainSc: 4       Patients Stated Pain Goal: 3 (91/63/84 [id number])  Complications: No apparent anesthesia complications

## 2018-11-02 ENCOUNTER — Encounter (HOSPITAL_COMMUNITY): Payer: Self-pay | Admitting: Surgery

## 2018-11-02 DIAGNOSIS — D689 Coagulation defect, unspecified: Secondary | ICD-10-CM | POA: Diagnosis not present

## 2018-11-02 DIAGNOSIS — D631 Anemia in chronic kidney disease: Secondary | ICD-10-CM | POA: Diagnosis not present

## 2018-11-02 DIAGNOSIS — E1022 Type 1 diabetes mellitus with diabetic chronic kidney disease: Secondary | ICD-10-CM | POA: Diagnosis not present

## 2018-11-02 DIAGNOSIS — N2581 Secondary hyperparathyroidism of renal origin: Secondary | ICD-10-CM | POA: Diagnosis not present

## 2018-11-02 DIAGNOSIS — Z4931 Encounter for adequacy testing for hemodialysis: Secondary | ICD-10-CM | POA: Diagnosis not present

## 2018-11-02 DIAGNOSIS — N186 End stage renal disease: Secondary | ICD-10-CM | POA: Diagnosis not present

## 2018-11-02 DIAGNOSIS — T82818A Embolism of vascular prosthetic devices, implants and grafts, initial encounter: Secondary | ICD-10-CM | POA: Diagnosis not present

## 2018-11-02 DIAGNOSIS — L299 Pruritus, unspecified: Secondary | ICD-10-CM | POA: Diagnosis not present

## 2018-11-02 NOTE — Anesthesia Postprocedure Evaluation (Signed)
Anesthesia Post Note  Patient: Dale [name]  Procedure(s) Performed: FIRST STAGE BASILIC VEIN TRANSPOSITION  LEFT ARM (Left Arm Upper) Ligation Of  Brachio Cephalic Arteriovenous  Fistula (Left Arm Upper)     Patient location during evaluation: PACU Anesthesia Type: MAC Level of consciousness: awake and alert Pain management: pain level controlled Vital Signs Assessment: post-procedure vital signs reviewed and stable Respiratory status: spontaneous breathing, nonlabored ventilation and respiratory function stable Cardiovascular status: blood pressure returned to baseline and stable Postop Assessment: no apparent nausea or vomiting Anesthetic complications: no    Last Vitals:  Vitals:   11/01/18 1445 11/01/18 1452  BP: (!) 109/36 (!) 106/44  Pulse: 85 82  Resp: (!) 21 15  Temp: (!) 36.4 C   SpO2: 94% 96%    Last Pain:  Vitals:   11/01/18 0717  TempSrc:   PainSc: [place]

## 2018-11-05 DIAGNOSIS — N186 End stage renal disease: Secondary | ICD-10-CM | POA: Diagnosis not present

## 2018-11-05 DIAGNOSIS — Z4931 Encounter for adequacy testing for hemodialysis: Secondary | ICD-10-CM | POA: Diagnosis not present

## 2018-11-05 DIAGNOSIS — E1022 Type 1 diabetes mellitus with diabetic chronic kidney disease: Secondary | ICD-10-CM | POA: Diagnosis not present

## 2018-11-05 DIAGNOSIS — L299 Pruritus, unspecified: Secondary | ICD-10-CM | POA: Diagnosis not present

## 2018-11-05 DIAGNOSIS — N2581 Secondary hyperparathyroidism of renal origin: Secondary | ICD-10-CM | POA: Diagnosis not present

## 2018-11-05 DIAGNOSIS — D631 Anemia in chronic kidney disease: Secondary | ICD-10-CM | POA: Diagnosis not present

## 2018-11-05 DIAGNOSIS — T82818A Embolism of vascular prosthetic devices, implants and grafts, initial encounter: Secondary | ICD-10-CM | POA: Diagnosis not present

## 2018-11-05 DIAGNOSIS — D689 Coagulation defect, unspecified: Secondary | ICD-10-CM | POA: Diagnosis not present

## 2018-11-06 ENCOUNTER — Telehealth: Payer: Self-pay | Admitting: *Deleted

## 2018-11-06 NOTE — Telephone Encounter (Signed)
Call c/o weeping and swelling of arm s/p A/V fistula on 11/01/2018. Fluid is clear and changing dressing 2-3 times per day. Denies any fever, redness or heat at incision. Instructed to keep extremity above level of heart. Hand grips and keep clean, dry and covered. He is to have nurse check arm on 11/07/2018 at [facility] and call this office if they feel patient has no improvement and needs to be seen in office.

## 2018-11-07 DIAGNOSIS — N186 End stage renal disease: Secondary | ICD-10-CM | POA: Diagnosis not present

## 2018-11-07 DIAGNOSIS — L299 Pruritus, unspecified: Secondary | ICD-10-CM | POA: Diagnosis not present

## 2018-11-07 DIAGNOSIS — T82818A Embolism of vascular prosthetic devices, implants and grafts, initial encounter: Secondary | ICD-10-CM | POA: Diagnosis not present

## 2018-11-07 DIAGNOSIS — N2581 Secondary hyperparathyroidism of renal origin: Secondary | ICD-10-CM | POA: Diagnosis not present

## 2018-11-07 DIAGNOSIS — D631 Anemia in chronic kidney disease: Secondary | ICD-10-CM | POA: Diagnosis not present

## 2018-11-07 DIAGNOSIS — Z4931 Encounter for adequacy testing for hemodialysis: Secondary | ICD-10-CM | POA: Diagnosis not present

## 2018-11-07 DIAGNOSIS — E1022 Type 1 diabetes mellitus with diabetic chronic kidney disease: Secondary | ICD-10-CM | POA: Diagnosis not present

## 2018-11-07 DIAGNOSIS — D689 Coagulation defect, unspecified: Secondary | ICD-10-CM | POA: Diagnosis not present

## 2018-11-08 DIAGNOSIS — E291 Testicular hypofunction: Secondary | ICD-10-CM | POA: Diagnosis not present

## 2018-11-08 DIAGNOSIS — E039 Hypothyroidism, unspecified: Secondary | ICD-10-CM | POA: Diagnosis not present

## 2018-11-08 DIAGNOSIS — E1040 Type 1 diabetes mellitus with diabetic neuropathy, unspecified: Secondary | ICD-10-CM | POA: Diagnosis not present

## 2018-11-08 DIAGNOSIS — E782 Mixed hyperlipidemia: Secondary | ICD-10-CM | POA: Diagnosis not present

## 2018-11-09 DIAGNOSIS — N2581 Secondary hyperparathyroidism of renal origin: Secondary | ICD-10-CM | POA: Diagnosis not present

## 2018-11-09 DIAGNOSIS — T82818A Embolism of vascular prosthetic devices, implants and grafts, initial encounter: Secondary | ICD-10-CM | POA: Diagnosis not present

## 2018-11-09 DIAGNOSIS — Z4931 Encounter for adequacy testing for hemodialysis: Secondary | ICD-10-CM | POA: Diagnosis not present

## 2018-11-09 DIAGNOSIS — D689 Coagulation defect, unspecified: Secondary | ICD-10-CM | POA: Diagnosis not present

## 2018-11-09 DIAGNOSIS — L299 Pruritus, unspecified: Secondary | ICD-10-CM | POA: Diagnosis not present

## 2018-11-09 DIAGNOSIS — E1022 Type 1 diabetes mellitus with diabetic chronic kidney disease: Secondary | ICD-10-CM | POA: Diagnosis not present

## 2018-11-09 DIAGNOSIS — N186 End stage renal disease: Secondary | ICD-10-CM | POA: Diagnosis not present

## 2018-11-09 DIAGNOSIS — D631 Anemia in chronic kidney disease: Secondary | ICD-10-CM | POA: Diagnosis not present

## 2018-11-12 DIAGNOSIS — T82818A Embolism of vascular prosthetic devices, implants and grafts, initial encounter: Secondary | ICD-10-CM | POA: Diagnosis not present

## 2018-11-12 DIAGNOSIS — N186 End stage renal disease: Secondary | ICD-10-CM | POA: Diagnosis not present

## 2018-11-12 DIAGNOSIS — D689 Coagulation defect, unspecified: Secondary | ICD-10-CM | POA: Diagnosis not present

## 2018-11-12 DIAGNOSIS — D631 Anemia in chronic kidney disease: Secondary | ICD-10-CM | POA: Diagnosis not present

## 2018-11-12 DIAGNOSIS — E1022 Type 1 diabetes mellitus with diabetic chronic kidney disease: Secondary | ICD-10-CM | POA: Diagnosis not present

## 2018-11-12 DIAGNOSIS — Z4931 Encounter for adequacy testing for hemodialysis: Secondary | ICD-10-CM | POA: Diagnosis not present

## 2018-11-12 DIAGNOSIS — N2581 Secondary hyperparathyroidism of renal origin: Secondary | ICD-10-CM | POA: Diagnosis not present

## 2018-11-12 DIAGNOSIS — L299 Pruritus, unspecified: Secondary | ICD-10-CM | POA: Diagnosis not present

## 2018-11-14 DIAGNOSIS — N186 End stage renal disease: Secondary | ICD-10-CM | POA: Diagnosis not present

## 2018-11-14 DIAGNOSIS — N2581 Secondary hyperparathyroidism of renal origin: Secondary | ICD-10-CM | POA: Diagnosis not present

## 2018-11-14 DIAGNOSIS — Z4931 Encounter for adequacy testing for hemodialysis: Secondary | ICD-10-CM | POA: Diagnosis not present

## 2018-11-14 DIAGNOSIS — T82818A Embolism of vascular prosthetic devices, implants and grafts, initial encounter: Secondary | ICD-10-CM | POA: Diagnosis not present

## 2018-11-14 DIAGNOSIS — L299 Pruritus, unspecified: Secondary | ICD-10-CM | POA: Diagnosis not present

## 2018-11-14 DIAGNOSIS — D631 Anemia in chronic kidney disease: Secondary | ICD-10-CM | POA: Diagnosis not present

## 2018-11-14 DIAGNOSIS — D689 Coagulation defect, unspecified: Secondary | ICD-10-CM | POA: Diagnosis not present

## 2018-11-14 DIAGNOSIS — E1022 Type 1 diabetes mellitus with diabetic chronic kidney disease: Secondary | ICD-10-CM | POA: Diagnosis not present

## 2018-11-15 DIAGNOSIS — E291 Testicular hypofunction: Secondary | ICD-10-CM | POA: Diagnosis not present

## 2018-11-15 DIAGNOSIS — E782 Mixed hyperlipidemia: Secondary | ICD-10-CM | POA: Diagnosis not present

## 2018-11-15 DIAGNOSIS — E1040 Type 1 diabetes mellitus with diabetic neuropathy, unspecified: Secondary | ICD-10-CM | POA: Diagnosis not present

## 2018-11-16 DIAGNOSIS — N2581 Secondary hyperparathyroidism of renal origin: Secondary | ICD-10-CM | POA: Diagnosis not present

## 2018-11-16 DIAGNOSIS — L299 Pruritus, unspecified: Secondary | ICD-10-CM | POA: Diagnosis not present

## 2018-11-16 DIAGNOSIS — D689 Coagulation defect, unspecified: Secondary | ICD-10-CM | POA: Diagnosis not present

## 2018-11-16 DIAGNOSIS — D631 Anemia in chronic kidney disease: Secondary | ICD-10-CM | POA: Diagnosis not present

## 2018-11-16 DIAGNOSIS — N186 End stage renal disease: Secondary | ICD-10-CM | POA: Diagnosis not present

## 2018-11-16 DIAGNOSIS — E1022 Type 1 diabetes mellitus with diabetic chronic kidney disease: Secondary | ICD-10-CM | POA: Diagnosis not present

## 2018-11-16 DIAGNOSIS — T82818A Embolism of vascular prosthetic devices, implants and grafts, initial encounter: Secondary | ICD-10-CM | POA: Diagnosis not present

## 2018-11-16 DIAGNOSIS — Z4931 Encounter for adequacy testing for hemodialysis: Secondary | ICD-10-CM | POA: Diagnosis not present

## 2018-11-19 ENCOUNTER — Other Ambulatory Visit: Payer: Self-pay

## 2018-11-19 ENCOUNTER — Ambulatory Visit (INDEPENDENT_AMBULATORY_CARE_PROVIDER_SITE_OTHER): Payer: Medicare Other | Admitting: Family

## 2018-11-19 ENCOUNTER — Encounter: Payer: Self-pay | Admitting: Family

## 2018-11-19 VITALS — BP 109/56 | HR 83 | Temp 98.3°F | Resp 18 | Ht 68.0 in | Wt 283.7 lb

## 2018-11-19 DIAGNOSIS — D631 Anemia in chronic kidney disease: Secondary | ICD-10-CM | POA: Diagnosis not present

## 2018-11-19 DIAGNOSIS — T82818A Embolism of vascular prosthetic devices, implants and grafts, initial encounter: Secondary | ICD-10-CM | POA: Diagnosis not present

## 2018-11-19 DIAGNOSIS — N2581 Secondary hyperparathyroidism of renal origin: Secondary | ICD-10-CM | POA: Diagnosis not present

## 2018-11-19 DIAGNOSIS — Z992 Dependence on renal dialysis: Secondary | ICD-10-CM

## 2018-11-19 DIAGNOSIS — L299 Pruritus, unspecified: Secondary | ICD-10-CM | POA: Diagnosis not present

## 2018-11-19 DIAGNOSIS — N186 End stage renal disease: Secondary | ICD-10-CM | POA: Diagnosis not present

## 2018-11-19 DIAGNOSIS — E1022 Type 1 diabetes mellitus with diabetic chronic kidney disease: Secondary | ICD-10-CM | POA: Diagnosis not present

## 2018-11-19 DIAGNOSIS — I770 Arteriovenous fistula, acquired: Secondary | ICD-10-CM

## 2018-11-19 DIAGNOSIS — Z4931 Encounter for adequacy testing for hemodialysis: Secondary | ICD-10-CM | POA: Diagnosis not present

## 2018-11-19 DIAGNOSIS — D689 Coagulation defect, unspecified: Secondary | ICD-10-CM | POA: Diagnosis not present

## 2018-11-19 MED ORDER — CEPHALEXIN 500 MG PO CAPS: 500.0000 mg | ORAL_CAPSULE | Freq: Two times a day (BID) | ORAL | 0 refills | Status: DC

## 2018-11-19 NOTE — Progress Notes (Signed)
CC: AVF incision evaluation and possible sutures removal s/p AVF creation left arm   History of Present Illness  Dale [name] is a 53 y.o. (May 13, 1965) male who is s/p ligation of left brachiocephalic fistula, left first stage basilic vein fistula creation, and redo exposure of left brachial artery on 11-01-18 by Dr. Trula Slade.  His right central venous system is occluded.  Findings: Multiple grafts were identified in the antecubital region.  The tissue was of poor quality.  Dr. Trula Slade ligated the brachiocephalic fistula and performed a first staged left basilic vein fistula creation.  If the basilic vein fistula does not mature, Dr. Trula Slade recommended an upper arm loop graft to avoid going back into the antecubital region.   Pt returns today for 2-3 weeks post op suture removal and incision check.   He is already scheduled for 6 weeks access duplex and discuss 2nd stage AVF superficialization on 12-17-18 with Dr. Trula Slade.   He dialyzes via right IJ TDC on MWF.   He states that he has had previous a thigh access and a right arm access.   He reports that his left arm incision has had an opening and it is closing up. There is clear thin drainage from the shallow open area of the incision.    Past Medical History:  Diagnosis Date   Anal fissure    Anemia of chronic disease    Arthritis    "hands, right knee" (03/19/2014) no cartlidge in right knee   CAD (coronary artery disease)    a. Abnl nuc ->LHC (11/15):  mid to dist Dx 80%, mid RCA 99% (functional CTO), dist RCA 80% >> PCI: balloon angioplasty to mid RCA (could not deliver stent) - staged PCI Rotoblator atherectomy/DES to [facility]. b. NSTEMI 12/2015 s/p DES to diagonal.   Cholecystitis    a. 08/27/2011   Chronic diastolic CHF (congestive heart failure) ([place])    a. Echo 3/13:  EF 55-60%;  b. Echo (11/15):  Mild LVH, EF 60-65%, no RWMA, Gr 1 DD, MAC, mild LAE, normal RVF, mild RAE;  c.  Echo 5/16:  severe LVH, EF 55-60%, no RWMA,  Gr 1 DD, MAC, mild LAE   Claustrophobia    when things get around his face.    Complication of anesthesia    Difficult intubation    only once in 2015 at [facility] haven't had a problem since then   Diverticulosis    Esophageal dysmotility    ESRD (end stage renal disease) ([place])    a. 1995 s/p cadaveric transplant w/ susbequent failure after 18 yrs;  b.Dialysis initiated 07/2011 [facility] M-W-F   Fibromyalgia    Gastroparesis    GERD (gastroesophageal reflux disease)    Gout    PMH   History of blood transfusion    HLD (hyperlipidemia)    Hypertension    Hypothyroidism    Inguinal lymphadenopathy    a. bilateral - s/p biopsy 07/2011   Insulin dependent diabetes mellitus (HCC)    Type I   Monilial esophagitis (HCC)    Morbid obesity (HCC)    OSA (obstructive sleep apnea)    adjustable bed   PAD (peripheral artery disease) ([place])    a. s/p L BKA.   Pericardial effusion    a.  Small by CT 08/22/11;  b.  Large by CT 08/27/11. c. Not seen on 11/2015 echo.   Pneumonia ~ 2007   PONV (postoperative nausea and vomiting)    vomited once  after a procedure.   T1DM (type 1 diabetes mellitus) ([place])    Tracheobronchomalacia     Social History Social History   Tobacco Use   Smoking status: Never Smoker   Smokeless tobacco: Never Used  Substance Use Topics   Alcohol use: Yes    Comment: occasional   Drug use: No    Family History Family History  Problem Relation Age of Onset   Colon polyps Father    Diabetes Father    Hypertension Father    Hypertension Mother    Diabetes Mother    Hyperlipidemia Mother    Breast cancer Maternal Aunt    Bone cancer Maternal Aunt    Liver cancer Maternal Aunt    Malignant hyperthermia Neg Hx    Heart attack Neg Hx    Stroke Neg Hx    Colon cancer Neg Hx    Stomach cancer Neg Hx    Pancreatic cancer Neg Hx    Esophageal cancer Neg Hx     Surgical History Past Surgical History:  Procedure  Laterality Date   A/V FISTULAGRAM Left 07/26/2016   Procedure: A/V Fistulagram;  Surgeon: Waynetta Sandy, MD;  Location: [place] [facility];  Service: Cardiovascular;  Laterality: Left;   A/V FISTULAGRAM N/A 07/11/2017   Procedure: A/V FISTULAGRAM - Left AV;  Surgeon: Waynetta Sandy, MD;  Location: [place] [facility];  Service: Cardiovascular;  Laterality: N/A;   AMPUTATION OF REPLICATED TOES Right    ANGIOPLASTY  04/09/2014   RCA    APPENDECTOMY  ~ 2006   ARTERIOVENOUS GRAFT PLACEMENT Left 1993?   forearm   ARTERIOVENOUS GRAFT PLACEMENT Right 1993?   leg   ARTERIOVENOUS GRAFT PLACEMENT Left    Thigh   Arteriovenous Graft Removed Left    Thigh   AV FISTULA PLACEMENT  07/29/2011   Procedure: ARTERIOVENOUS (AV) FISTULA CREATION;  Surgeon: Mal Misty, MD;  Location: [place];  Service: Vascular;  Laterality: Right;  Brachial cephalic   AV FISTULA PLACEMENT Left 01/20/2015   Procedure: LIGATION OF RIGHT ARTERIOVENOUS FISTULA WITH EXCISION OF ANEURYSM;  Surgeon: Angelia Mould, MD;  Location: [name];  Service: Vascular;  Laterality: Left;   AV FISTULA PLACEMENT Left 04/30/2015   Procedure: CREATION OF LEFT ARM BRACHIO-CEPHALIC ARTERIOVENOUS (AV) FISTULA ;  Surgeon: Angelia Mould, MD;  Location: [place];  Service: Vascular;  Laterality: Left;   AV Graft PLACEMENT Left 1993?   "attempted one in my wrist; didn't take"   [place] Left 11/01/2018   Procedure: FIRST STAGE BASILIC VEIN TRANSPOSITION  LEFT ARM;  Surgeon: Serafina Mitchell, MD;  Location: [place];  Service: Vascular;  Laterality: Left;   BELOW KNEE LEG AMPUTATION Left 2010   CARDIAC CATHETERIZATION  03/19/2014   CARDIAC CATHETERIZATION  03/19/2014   Procedure: CORONARY BALLOON ANGIOPLASTY;  Surgeon: Burnell Blanks, MD;  Location: [facility] CATH LAB;  Service: Cardiovascular;;   CARDIAC CATHETERIZATION N/A 01/01/2016   Procedure: Left Heart Cath and Coronary Angiography;   Surgeon: Troy Sine, MD;  Location: [place] [facility];  Service: Cardiovascular;  Laterality: N/A;   CARDIAC CATHETERIZATION N/A 01/01/2016   Procedure: Coronary Stent Intervention;  Surgeon: Troy Sine, MD;  Location: [place] [facility];  Service: Cardiovascular;  Laterality: N/A;   CARPAL TUNNEL RELEASE Bilateral after 2005   CATARACT EXTRACTION W/ INTRAOCULAR LENS  IMPLANT, BILATERAL Bilateral 1990's   COLONOSCOPY  08/29/2011   Procedure: COLONOSCOPY;  Surgeon: Jerene Bears, MD;  Location: [facility];  Service: Gastroenterology;  Laterality: N/A;   COLONOSCOPY WITH PROPOFOL N/A 04/26/2016   Procedure: COLONOSCOPY WITH PROPOFOL;  Surgeon: Jerene Bears, MD;  Location: [facility] ENDOS[facility];  Service: Gastroenterology;  Laterality: N/A;   CORONARY ANGIOPLASTY WITH STENT PLACEMENT  04/09/2014       ptca/des mid lad    ESOPHAGOGASTRODUODENOSCOPY N/A 12/25/2015   Procedure: ESOPHAGOGASTRODUODENOSCOPY (EGD);  Surgeon: Mauri Pole, MD;  Location: [facility] ENDOSCOPY;  Service: Endoscopy;  Laterality: N/A;  bedside   ESOPHAGOGASTRODUODENOSCOPY (EGD) WITH PROPOFOL N/A 04/26/2016   Procedure: ESOPHAGOGASTRODUODENOSCOPY (EGD) WITH PROPOFOL;  Surgeon: Jerene Bears, MD;  Location: [facility];  Service: Gastroenterology;  Laterality: N/A;   EYE SURGERY Bilateral    cataract   FEMORAL ARTERY REPAIR  04/09/2014   ANGIOSEAL   INSERTION OF DIALYSIS CATHETER  08/01/2011   Procedure: INSERTION OF DIALYSIS CATHETER;  Surgeon: Rosetta Posner, MD;  Location: [place];  Service: Vascular;  Laterality: Right;  insertion of dialysis catheter on right internal jugular vein   INSERTION OF DIALYSIS CATHETER Right 01/20/2015   Procedure: INSERTION OF DIALYSIS CATHETER - RIGHT INTERNAL JUGULAR ;  Surgeon: Angelia Mould, MD;  Location: [place];  Service: Vascular;  Laterality: Right;   INSERTION OF DIALYSIS CATHETER Right 08/02/2018   Procedure: Insertion Of Dialysis Catheter;  Surgeon: Angelia Mould, MD;   Location: [place];  Service: Vascular;  Laterality: Right;   KIDNEY TRANSPLANT  04/25/1993   LEFT HEART CATHETERIZATION WITH CORONARY ANGIOGRAM N/A 03/19/2014   Procedure: LEFT HEART CATHETERIZATION WITH CORONARY ANGIOGRAM;  Surgeon: Burnell Blanks, MD;  Location: [facility] CATH LAB;  Service: Cardiovascular;  Laterality: N/A;   LIGATION OF ARTERIOVENOUS  FISTULA Left 11/01/2018   Procedure: Ligation Of  Brachio Cephalic Arteriovenous  Fistula;  Surgeon: Serafina Mitchell, MD;  Location: [place];  Service: Vascular;  Laterality: Left;   LYMPH NODE BIOPSY  08/10/2011   Procedure: LYMPH NODE BIOPSY;  Surgeon: Merrie Roof, MD;  Location: [place];  Service: General;  Laterality: Left;  left groin lymph biopsy   NEUROPLASTY / TRANSPOSITION ULNAR NERVE AT ELBOW Left after 2005   PATCH ANGIOPLASTY Left 08/09/2016   Procedure: LEFT  ARTERIOVENOUS FISTULA PATCH ANGIOPLASTY;  Surgeon: Waynetta Sandy, MD;  Location: [place];  Service: Vascular;  Laterality: Left;   PERCUTANEOUS CORONARY ROTOBLATOR INTERVENTION (PCI-R) N/A 04/09/2014   Procedure: PERCUTANEOUS CORONARY ROTOBLATOR INTERVENTION (PCI-R);  Surgeon: Burnell Blanks, MD;  Location: [facility] CATH LAB;  Service: Cardiovascular;  Laterality: N/A;   PERIPHERAL VASCULAR BALLOON ANGIOPLASTY  07/11/2017   Procedure: PERIPHERAL VASCULAR BALLOON ANGIOPLASTY;  Surgeon: Waynetta Sandy, MD;  Location: [place] [facility];  Service: Cardiovascular;;  left arm AV fistula   PERIPHERAL VASCULAR CATHETERIZATION N/A 12/25/2014   Procedure: Upper Extremity Venography;  Surgeon: Conrad Prinsburg, MD;  Location: [place] [facility];  Service: Cardiovascular;  Laterality: N/A;   PERIPHERAL VASCULAR CATHETERIZATION Left 02/24/2016   Procedure: Fistulagram;  Surgeon: Waynetta Sandy, MD;  Location: [place] [facility];  Service: Cardiovascular;  Laterality: Left;   PERIPHERAL VASCULAR CATHETERIZATION Left 02/24/2016   Procedure: Peripheral Vascular  Intervention;  Surgeon: Waynetta Sandy, MD;  Location: [place] [facility];  Service: Cardiovascular;  Laterality: Left;  AV FISTULA   PERITONEAL CATHETER INSERTION     PERITONEAL CATHETER REMOVAL     REVISON OF ARTERIOVENOUS FISTULA Right 1/00/7121   Procedure: PLICATION / REVISION OF ARTERIOVENOUS FISTULA;  Surgeon: Angelia Mould,  MD;  Location: [place];  Service: Vascular;  Laterality: Right;   REVISON OF ARTERIOVENOUS FISTULA Left 09/01/2015   Procedure: SUPERFICIALIZATION OF LEFT ARM BRACHIOCEPHALIC ARTERIOVENOUS FISTULA;  Surgeon: Angelia Mould, MD;  Location: [place];  Service: Vascular;  Laterality: Left;   REVISON OF ARTERIOVENOUS FISTULA Left 08/09/2016   Procedure: REVISION OF ARTERIAL ANASTOMOSIS OF ARTERIOVENOUS FISTULA;  Surgeon: Waynetta Sandy, MD;  Location: [place];  Service: Vascular;  Laterality: Left;   SHOULDER ARTHROSCOPY Left ~ 2006   frozen   THROMBECTOMY W/ EMBOLECTOMY Left 08/02/2018   Procedure: THROMBECTOMY ARTERIOVENOUS FISTULA;  Surgeon: Angelia Mould, MD;  Location: [place];  Service: Vascular;  Laterality: Left;   TOE AMPUTATION Bilateral after 8299   UMBILICAL HERNIA REPAIR  1990's   UPPER EXTREMITY VENOGRAPHY Bilateral 10/16/2018   Procedure: UPPER EXTREMITY VENOGRAPHY;  Surgeon: Serafina Mitchell, MD;  Location: [place] [facility] LAB;  Service: Cardiovascular;  Laterality: Bilateral;   VENOGRAM Right 07/28/2011   Procedure: VENOGRAM;  Surgeon: Conrad Aurora, MD;  Location: [facility] CATH LAB;  Service: Cardiovascular;  Laterality: Right;   VITRECTOMY Bilateral     Allergies  Allergen Reactions   Iodinated Diagnostic Agents Itching, Rash and Other (See Comments)    Systemic itching and transient rash appearance within hours of receiving contrast     Prednisone Other (See Comments)    Increases sugar levels   Adhesive [Tape] Itching and Other (See Comments)    Blisters on arm from adhesive tape at dialysis    Amoxicillin  Itching, Rash and Other (See Comments)    Has patient had a PCN reaction causing immediate rash, facial/tongue/throat swelling, SOB or lightheadedness with hypotension: Yes Has patient had a PCN reaction causing severe rash involving mucus membranes or skin necrosis: No Has patient had a PCN reaction that required hospitalization No Has patient had a PCN reaction occurring within the last 10 years: No If all of the above answers are "NO", then may proceed with Cephalosporin use.   Clindamycin/Lincomycin Hives and Itching   Enalapril Rash and Cough    Cough   Mobic [Meloxicam] Hives and Rash   Morphine Hives, Nausea Only and Rash   Brilinta [Ticagrelor] Nausea And Vomiting   Ciprofloxacin Itching and Rash   Clopidogrel Rash   Codeine Hives, Itching and Rash   Doxycycline Rash   Hydrocodone Rash   Levofloxacin Hives and Itching   Oxycodone Rash   Rocephin [Ceftriaxone] Itching    Current Outpatient Medications  Medication Sig Dispense Refill   acetaminophen (TYLENOL) 500 MG tablet Take 1,000 mg by mouth at bedtime.     allopurinol (ZYLOPRIM) 100 MG tablet Take 2 tablets (200 mg total) by mouth daily. 180 tablet 3   aspirin EC 81 MG tablet Take 81 mg by mouth at bedtime.     atorvastatin (LIPITOR) 10 MG tablet Take 10 mg by mouth daily.      calcium acetate (PHOSLO) 667 MG capsule Take 1,334 mg by mouth 3 (three) times daily with meals.      cetirizine (ZYRTEC) 10 MG tablet Take 10 mg by mouth daily.     diphenhydrAMINE (BENADRYL) 25 MG tablet Take 50 mg by mouth at bedtime.      docusate sodium (COLACE) 100 MG capsule Take 100 mg by mouth 2 (two) times daily.      esomeprazole (NEXIUM) 20 MG capsule Take 20 mg by mouth daily at 12 noon.     famotidine (PEPCID) 20 MG tablet Take  20 mg by mouth daily as needed for heartburn or indigestion.      fluticasone (FLONASE) 50 MCG/ACT nasal spray Place 2 sprays into both nostrils daily as needed for allergies.       gabapentin (NEURONTIN) 300 MG capsule Take 300 mg by mouth at bedtime.     GLUCAGON EMERGENCY 1 MG injection Inject 1 mg into the muscle daily as needed (low blood sugar).      hydrocortisone 2.5 % ointment Apply 1 application topically 2 (two) times daily as needed (itching).     HYDROmorphone (DILAUDID) 2 MG tablet Take 2 mg by mouth 2 (two) times daily.      hydrOXYzine (ATARAX/VISTARIL) 10 MG tablet Take 10 mg by mouth 3 (three) times daily as needed for itching.     insulin lispro (HUMALOG) 100 UNIT/ML injection Inject 75-120 Units into the skin continuous. Per insulin pump     levothyroxine (SYNTHROID, LEVOTHROID) 112 MCG tablet Take 112 mcg by mouth at bedtime.      Melatonin 5 MG TABS Take 5 mg by mouth at bedtime.     metoCLOPramide (REGLAN) 10 MG tablet Take 10 mg by mouth 4 (four) times daily -  before meals and at bedtime.      midodrine (PROAMATINE) 10 MG tablet Take 1 tablet (10 mg total) by mouth 3 (three) times daily. Takes 1 tab in mornings of dialysis and takes 2 tabs 30 mins before starting dialysis (m,w,f)     multivitamin (RENA-VIT) TABS tablet Take 1 tablet by mouth daily.     nitroGLYCERIN (NITROSTAT) 0.4 MG SL tablet Place 1 tablet (0.4 mg total) under the tongue every 5 (five) minutes as needed for chest pain. 25 tablet 4   Nutritional Supplements (FEEDING SUPPLEMENT, NEPRO CARB STEADY,) LIQD Take 237 mLs by mouth daily. (Patient taking differently: Take 237 mLs by mouth 3 (three) times a week. ) 30 Can 0   nystatin (NYSTATIN) powder Apply 1 g topically daily as needed (yeast).     Polyethyl Glycol-Propyl Glycol (SYSTANE) 0.4-0.3 % SOLN Place 1 drop into both eyes at bedtime.      polyethylene glycol (MIRALAX / GLYCOLAX) packet Take 17 g by mouth daily. (Patient taking differently: Take 17 g by mouth daily as needed for mild constipation. ) 14 each 0   prasugrel (EFFIENT) 10 MG TABS tablet TAKE 1 TABLET BY MOUTH EVERY DAY (Patient taking differently: Take 10  mg by mouth daily. ) 90 tablet 0   promethazine (PHENERGAN) 25 MG tablet Take 25 mg by mouth every 6 (six) hours as needed for nausea or vomiting.     sodium chloride (MURO 128) 5 % ophthalmic ointment Place 1 drop into both eyes at bedtime.      testosterone cypionate (DEPOTESTOSTERONE CYPIONATE) 200 MG/ML injection Inject 100 mg into the muscle every 14 (fourteen) days.   1   VELPHORO 500 MG chewable tablet Chew 500-1,000 mg by mouth See admin instructions. Chew 1,000 mg by mouth three times a day with meals and 500 mg twice daily with snacks  10   rOPINIRole (REQUIP) 2 MG tablet Take 1 tablet (2 mg total) by mouth at bedtime. Take 2 mg by mouth every night at bedtime 30 tablet 0   No current facility-administered medications for this visit.      REVIEW OF SYSTEMS: see HPI for pertinent positives and negatives    PHYSICAL EXAMINATION:  Vitals:   11/19/18 1206  BP: (!) 109/56  Pulse: 83  Resp: 18  Temp: 98.3 F (36.8 C)  TempSrc: Temporal  SpO2: 94%  Weight: 283 lb 11.7 oz (128.7 kg)  Height: _0  (1.727 m)   Body mass index is 43.14 kg/m.  General: The patient appears their stated age.   HEENT:  No gross abnormalities Pulmonary: Respirations are non-labored Abdomen: Soft and non-tender with. Musculoskeletal: There are no major deformities.   Neurologic: No focal weakness or paresthesias are detected, Skin: There are no ulcer or rashes noted. Psychiatric: The patient has normal affect. Cardiovascular: There is a regular rate and rhythm without significant murmur appreciated.    Left antecubital area, AVF incision. Open area draining clear thin liquid     Medical Decision Making  Dale [name] is a 53 y.o. male who who is s/p ligation of left brachiocephalic fistula, left first stage basilic vein fistula creation, and redo exposure of left brachial artery on 11-01-18 by Dr. Trula Slade.  His right central venous system is occluded.  Findings: Multiple grafts were  identified in the antecubital region.  The tissue was of poor quality.  Dr. Trula Slade ligated the brachiocephalic fistula and performed a first staged left basilic vein fistula creation.  If the basilic vein fistula does not mature, Dr. Trula Slade recommended an upper arm loop graft to avoid going back into the antecubital region.   Left arm AVF incision with shallow open area that is contracting per pt report. The open area is draining a moderate amount of thin clear liquid which is consistent with subcutaneous edema. He also states that the swelling in his left arm had subsided a little.  There is mild erythema at the AVF surrounding tissue.  He denies fever or chills.  He reports baseline tingling and numbness in his left forearm and hand for years, is no worse lately.   He states that he has taken Keflex and Bactrim before with no adverse effects; he has multiple allergies.  I electronically sent a prescription for Keflex to his pharmacy: 500 mg capsule po bid x 10 days, 0 refills.   I discussed with Dr. Oneida Alar the clear thin drainage of the shallow open area at the AVF incision, and improving swelling at and surrounding the incision.  Will hold off on removing sutures, follow up in a week for incision check with me or PA or Dr. Trula Slade. However, pt states he does not want to return in a week, he states he will return in 2 weeks.  I advised him and his wife to notify us if he develops fever or chills, or is his AVF site starts to seem infected.   He is already scheduled for 6 weeks access duplex and discuss 2nd stage AVF superficialization on 12-17-18 with Dr. Freeman Caldron Joedy Eickhoff, RN, MSN, FNP-C [facility] [place] Office: [phone]  11/19/2018, 12:21 PM  Clinic MD: Oneida Alar on call

## 2018-11-21 DIAGNOSIS — Z4931 Encounter for adequacy testing for hemodialysis: Secondary | ICD-10-CM | POA: Diagnosis not present

## 2018-11-21 DIAGNOSIS — L299 Pruritus, unspecified: Secondary | ICD-10-CM | POA: Diagnosis not present

## 2018-11-21 DIAGNOSIS — N2581 Secondary hyperparathyroidism of renal origin: Secondary | ICD-10-CM | POA: Diagnosis not present

## 2018-11-21 DIAGNOSIS — N186 End stage renal disease: Secondary | ICD-10-CM | POA: Diagnosis not present

## 2018-11-21 DIAGNOSIS — D631 Anemia in chronic kidney disease: Secondary | ICD-10-CM | POA: Diagnosis not present

## 2018-11-21 DIAGNOSIS — T82818A Embolism of vascular prosthetic devices, implants and grafts, initial encounter: Secondary | ICD-10-CM | POA: Diagnosis not present

## 2018-11-21 DIAGNOSIS — D689 Coagulation defect, unspecified: Secondary | ICD-10-CM | POA: Diagnosis not present

## 2018-11-21 DIAGNOSIS — E1022 Type 1 diabetes mellitus with diabetic chronic kidney disease: Secondary | ICD-10-CM | POA: Diagnosis not present

## 2018-11-23 DIAGNOSIS — T82818A Embolism of vascular prosthetic devices, implants and grafts, initial encounter: Secondary | ICD-10-CM | POA: Diagnosis not present

## 2018-11-23 DIAGNOSIS — D631 Anemia in chronic kidney disease: Secondary | ICD-10-CM | POA: Diagnosis not present

## 2018-11-23 DIAGNOSIS — N186 End stage renal disease: Secondary | ICD-10-CM | POA: Diagnosis not present

## 2018-11-23 DIAGNOSIS — Z4931 Encounter for adequacy testing for hemodialysis: Secondary | ICD-10-CM | POA: Diagnosis not present

## 2018-11-23 DIAGNOSIS — L299 Pruritus, unspecified: Secondary | ICD-10-CM | POA: Diagnosis not present

## 2018-11-23 DIAGNOSIS — E1022 Type 1 diabetes mellitus with diabetic chronic kidney disease: Secondary | ICD-10-CM | POA: Diagnosis not present

## 2018-11-23 DIAGNOSIS — Z992 Dependence on renal dialysis: Secondary | ICD-10-CM | POA: Diagnosis not present

## 2018-11-23 DIAGNOSIS — D689 Coagulation defect, unspecified: Secondary | ICD-10-CM | POA: Diagnosis not present

## 2018-11-23 DIAGNOSIS — N2581 Secondary hyperparathyroidism of renal origin: Secondary | ICD-10-CM | POA: Diagnosis not present

## 2018-11-23 DIAGNOSIS — T8612 Kidney transplant failure: Secondary | ICD-10-CM | POA: Diagnosis not present

## 2018-11-26 DIAGNOSIS — Z992 Dependence on renal dialysis: Secondary | ICD-10-CM | POA: Diagnosis not present

## 2018-11-26 DIAGNOSIS — N2581 Secondary hyperparathyroidism of renal origin: Secondary | ICD-10-CM | POA: Diagnosis not present

## 2018-11-26 DIAGNOSIS — T82818A Embolism of vascular prosthetic devices, implants and grafts, initial encounter: Secondary | ICD-10-CM | POA: Diagnosis not present

## 2018-11-26 DIAGNOSIS — N186 End stage renal disease: Secondary | ICD-10-CM | POA: Diagnosis not present

## 2018-11-26 DIAGNOSIS — E1022 Type 1 diabetes mellitus with diabetic chronic kidney disease: Secondary | ICD-10-CM | POA: Diagnosis not present

## 2018-11-26 DIAGNOSIS — D631 Anemia in chronic kidney disease: Secondary | ICD-10-CM | POA: Diagnosis not present

## 2018-11-26 DIAGNOSIS — D689 Coagulation defect, unspecified: Secondary | ICD-10-CM | POA: Diagnosis not present

## 2018-11-28 DIAGNOSIS — D631 Anemia in chronic kidney disease: Secondary | ICD-10-CM | POA: Diagnosis not present

## 2018-11-28 DIAGNOSIS — D689 Coagulation defect, unspecified: Secondary | ICD-10-CM | POA: Diagnosis not present

## 2018-11-28 DIAGNOSIS — T82818A Embolism of vascular prosthetic devices, implants and grafts, initial encounter: Secondary | ICD-10-CM | POA: Diagnosis not present

## 2018-11-28 DIAGNOSIS — N2581 Secondary hyperparathyroidism of renal origin: Secondary | ICD-10-CM | POA: Diagnosis not present

## 2018-11-28 DIAGNOSIS — E1022 Type 1 diabetes mellitus with diabetic chronic kidney disease: Secondary | ICD-10-CM | POA: Diagnosis not present

## 2018-11-28 DIAGNOSIS — N186 End stage renal disease: Secondary | ICD-10-CM | POA: Diagnosis not present

## 2018-11-28 DIAGNOSIS — Z992 Dependence on renal dialysis: Secondary | ICD-10-CM | POA: Diagnosis not present

## 2018-11-30 ENCOUNTER — Telehealth (HOSPITAL_COMMUNITY): Payer: Self-pay | Admitting: Rehabilitation

## 2018-11-30 ENCOUNTER — Ambulatory Visit: Payer: Medicare Other

## 2018-11-30 DIAGNOSIS — N2581 Secondary hyperparathyroidism of renal origin: Secondary | ICD-10-CM | POA: Diagnosis not present

## 2018-11-30 DIAGNOSIS — D689 Coagulation defect, unspecified: Secondary | ICD-10-CM | POA: Diagnosis not present

## 2018-11-30 DIAGNOSIS — E1022 Type 1 diabetes mellitus with diabetic chronic kidney disease: Secondary | ICD-10-CM | POA: Diagnosis not present

## 2018-11-30 DIAGNOSIS — D631 Anemia in chronic kidney disease: Secondary | ICD-10-CM | POA: Diagnosis not present

## 2018-11-30 DIAGNOSIS — Z992 Dependence on renal dialysis: Secondary | ICD-10-CM | POA: Diagnosis not present

## 2018-11-30 DIAGNOSIS — N186 End stage renal disease: Secondary | ICD-10-CM | POA: Diagnosis not present

## 2018-11-30 DIAGNOSIS — T82818A Embolism of vascular prosthetic devices, implants and grafts, initial encounter: Secondary | ICD-10-CM | POA: Diagnosis not present

## 2018-11-30 NOTE — Telephone Encounter (Signed)

## 2018-12-03 ENCOUNTER — Encounter: Payer: Self-pay | Admitting: Physician Assistant

## 2018-12-03 ENCOUNTER — Ambulatory Visit (INDEPENDENT_AMBULATORY_CARE_PROVIDER_SITE_OTHER): Payer: Self-pay | Admitting: Physician Assistant

## 2018-12-03 ENCOUNTER — Other Ambulatory Visit: Payer: Self-pay

## 2018-12-03 VITALS — BP 121/73 | HR 96 | Temp 98.2°F | Resp 14 | Ht 68.0 in | Wt 285.0 lb

## 2018-12-03 DIAGNOSIS — N186 End stage renal disease: Secondary | ICD-10-CM

## 2018-12-03 DIAGNOSIS — E1022 Type 1 diabetes mellitus with diabetic chronic kidney disease: Secondary | ICD-10-CM | POA: Diagnosis not present

## 2018-12-03 DIAGNOSIS — Z992 Dependence on renal dialysis: Secondary | ICD-10-CM | POA: Diagnosis not present

## 2018-12-03 DIAGNOSIS — D689 Coagulation defect, unspecified: Secondary | ICD-10-CM | POA: Diagnosis not present

## 2018-12-03 DIAGNOSIS — T82818A Embolism of vascular prosthetic devices, implants and grafts, initial encounter: Secondary | ICD-10-CM | POA: Diagnosis not present

## 2018-12-03 DIAGNOSIS — D631 Anemia in chronic kidney disease: Secondary | ICD-10-CM | POA: Diagnosis not present

## 2018-12-03 DIAGNOSIS — N2581 Secondary hyperparathyroidism of renal origin: Secondary | ICD-10-CM | POA: Diagnosis not present

## 2018-12-03 NOTE — Progress Notes (Signed)
    Postoperative Access Visit   History of Present Illness   Dale [name] is a 53 y.o. year old male who presents for postoperative follow-up for: left first stage basilic vein transposition by Dr. Trula Slade 11/01/18.  [name] wounds are mostly healed with 2 areas of eschar remaining.  The patient denies steal symptoms.  The patient is able to complete their activities of daily living.  The patient is dialyzing via [place].   Physical Examination   Vitals:   12/03/18 1543  BP: 121/73  Pulse: 96  Resp: 14  Temp: 98.2 F (36.8 C)  TempSrc: Temporal  SpO2: 98%  Weight: 285 lb (129.3 kg)  Height: 5\' 8"  (1.727 m)   Body mass index is 43.33 kg/m.  left arm Incision is mostly healed with 2 small eschars remaining, hand grip is 5/5, sensation in digits is intact, bruit can  be auscultated     Medical Decision Making   Dale [name] is a 53 y.o. year old male who presents s/p left first stage basilic vein transposition   Sutures removed; incision nearly healed LUE  Patent fistula without signs or symptoms of steal syndrome  He is scheduled for fistula duplex in 2 weeks to determine readiness for 2nd stage transposition   Dagoberto Ligas PA-C [facility] [place] Office: [phone]  Clinic MD: Dr. Trula Slade

## 2018-12-05 DIAGNOSIS — Z992 Dependence on renal dialysis: Secondary | ICD-10-CM | POA: Diagnosis not present

## 2018-12-05 DIAGNOSIS — E1022 Type 1 diabetes mellitus with diabetic chronic kidney disease: Secondary | ICD-10-CM | POA: Diagnosis not present

## 2018-12-05 DIAGNOSIS — T82818A Embolism of vascular prosthetic devices, implants and grafts, initial encounter: Secondary | ICD-10-CM | POA: Diagnosis not present

## 2018-12-05 DIAGNOSIS — N186 End stage renal disease: Secondary | ICD-10-CM | POA: Diagnosis not present

## 2018-12-05 DIAGNOSIS — N2581 Secondary hyperparathyroidism of renal origin: Secondary | ICD-10-CM | POA: Diagnosis not present

## 2018-12-05 DIAGNOSIS — D689 Coagulation defect, unspecified: Secondary | ICD-10-CM | POA: Diagnosis not present

## 2018-12-05 DIAGNOSIS — D631 Anemia in chronic kidney disease: Secondary | ICD-10-CM | POA: Diagnosis not present

## 2018-12-06 ENCOUNTER — Other Ambulatory Visit: Payer: Self-pay

## 2018-12-06 DIAGNOSIS — M2550 Pain in unspecified joint: Secondary | ICD-10-CM | POA: Diagnosis not present

## 2018-12-06 DIAGNOSIS — Z79899 Other long term (current) drug therapy: Secondary | ICD-10-CM | POA: Diagnosis not present

## 2018-12-06 DIAGNOSIS — Z23 Encounter for immunization: Secondary | ICD-10-CM | POA: Diagnosis not present

## 2018-12-06 DIAGNOSIS — N186 End stage renal disease: Secondary | ICD-10-CM

## 2018-12-07 DIAGNOSIS — N2581 Secondary hyperparathyroidism of renal origin: Secondary | ICD-10-CM | POA: Diagnosis not present

## 2018-12-07 DIAGNOSIS — E1022 Type 1 diabetes mellitus with diabetic chronic kidney disease: Secondary | ICD-10-CM | POA: Diagnosis not present

## 2018-12-07 DIAGNOSIS — T82818A Embolism of vascular prosthetic devices, implants and grafts, initial encounter: Secondary | ICD-10-CM | POA: Diagnosis not present

## 2018-12-07 DIAGNOSIS — Z992 Dependence on renal dialysis: Secondary | ICD-10-CM | POA: Diagnosis not present

## 2018-12-07 DIAGNOSIS — D631 Anemia in chronic kidney disease: Secondary | ICD-10-CM | POA: Diagnosis not present

## 2018-12-07 DIAGNOSIS — D689 Coagulation defect, unspecified: Secondary | ICD-10-CM | POA: Diagnosis not present

## 2018-12-07 DIAGNOSIS — N186 End stage renal disease: Secondary | ICD-10-CM | POA: Diagnosis not present

## 2018-12-10 DIAGNOSIS — D689 Coagulation defect, unspecified: Secondary | ICD-10-CM | POA: Diagnosis not present

## 2018-12-10 DIAGNOSIS — N2581 Secondary hyperparathyroidism of renal origin: Secondary | ICD-10-CM | POA: Diagnosis not present

## 2018-12-10 DIAGNOSIS — E1022 Type 1 diabetes mellitus with diabetic chronic kidney disease: Secondary | ICD-10-CM | POA: Diagnosis not present

## 2018-12-10 DIAGNOSIS — T82818A Embolism of vascular prosthetic devices, implants and grafts, initial encounter: Secondary | ICD-10-CM | POA: Diagnosis not present

## 2018-12-10 DIAGNOSIS — Z992 Dependence on renal dialysis: Secondary | ICD-10-CM | POA: Diagnosis not present

## 2018-12-10 DIAGNOSIS — N186 End stage renal disease: Secondary | ICD-10-CM | POA: Diagnosis not present

## 2018-12-10 DIAGNOSIS — D631 Anemia in chronic kidney disease: Secondary | ICD-10-CM | POA: Diagnosis not present

## 2018-12-12 DIAGNOSIS — E1022 Type 1 diabetes mellitus with diabetic chronic kidney disease: Secondary | ICD-10-CM | POA: Diagnosis not present

## 2018-12-12 DIAGNOSIS — Z992 Dependence on renal dialysis: Secondary | ICD-10-CM | POA: Diagnosis not present

## 2018-12-12 DIAGNOSIS — N2581 Secondary hyperparathyroidism of renal origin: Secondary | ICD-10-CM | POA: Diagnosis not present

## 2018-12-12 DIAGNOSIS — D689 Coagulation defect, unspecified: Secondary | ICD-10-CM | POA: Diagnosis not present

## 2018-12-12 DIAGNOSIS — T82818A Embolism of vascular prosthetic devices, implants and grafts, initial encounter: Secondary | ICD-10-CM | POA: Diagnosis not present

## 2018-12-12 DIAGNOSIS — N186 End stage renal disease: Secondary | ICD-10-CM | POA: Diagnosis not present

## 2018-12-12 DIAGNOSIS — D631 Anemia in chronic kidney disease: Secondary | ICD-10-CM | POA: Diagnosis not present

## 2018-12-14 DIAGNOSIS — D631 Anemia in chronic kidney disease: Secondary | ICD-10-CM | POA: Diagnosis not present

## 2018-12-14 DIAGNOSIS — N186 End stage renal disease: Secondary | ICD-10-CM | POA: Diagnosis not present

## 2018-12-14 DIAGNOSIS — T82818A Embolism of vascular prosthetic devices, implants and grafts, initial encounter: Secondary | ICD-10-CM | POA: Diagnosis not present

## 2018-12-14 DIAGNOSIS — E1022 Type 1 diabetes mellitus with diabetic chronic kidney disease: Secondary | ICD-10-CM | POA: Diagnosis not present

## 2018-12-14 DIAGNOSIS — N2581 Secondary hyperparathyroidism of renal origin: Secondary | ICD-10-CM | POA: Diagnosis not present

## 2018-12-14 DIAGNOSIS — D689 Coagulation defect, unspecified: Secondary | ICD-10-CM | POA: Diagnosis not present

## 2018-12-14 DIAGNOSIS — Z992 Dependence on renal dialysis: Secondary | ICD-10-CM | POA: Diagnosis not present

## 2018-12-17 ENCOUNTER — Ambulatory Visit (HOSPITAL_COMMUNITY)
Admission: RE | Admit: 2018-12-17 | Discharge: 2018-12-17 | Disposition: A | Discharge status: Home / Self Care | Payer: Medicare Other | Source: Ambulatory Visit | Attending: Family | Admitting: Family

## 2018-12-17 ENCOUNTER — Other Ambulatory Visit: Payer: Self-pay

## 2018-12-17 ENCOUNTER — Encounter (HOSPITAL_COMMUNITY): Payer: Medicare Other

## 2018-12-17 ENCOUNTER — Ambulatory Visit (INDEPENDENT_AMBULATORY_CARE_PROVIDER_SITE_OTHER): Payer: Self-pay | Admitting: Surgery

## 2018-12-17 ENCOUNTER — Encounter: Payer: Medicare Other | Admitting: Surgery

## 2018-12-17 ENCOUNTER — Encounter: Payer: Self-pay | Admitting: Surgery

## 2018-12-17 ENCOUNTER — Encounter: Payer: Self-pay | Admitting: *Deleted

## 2018-12-17 VITALS — BP 139/75 | HR 94 | Resp 20 | Ht 68.0 in | Wt 280.9 lb

## 2018-12-17 DIAGNOSIS — D689 Coagulation defect, unspecified: Secondary | ICD-10-CM | POA: Diagnosis not present

## 2018-12-17 DIAGNOSIS — N186 End stage renal disease: Secondary | ICD-10-CM

## 2018-12-17 DIAGNOSIS — Z992 Dependence on renal dialysis: Secondary | ICD-10-CM | POA: Diagnosis not present

## 2018-12-17 DIAGNOSIS — N2581 Secondary hyperparathyroidism of renal origin: Secondary | ICD-10-CM | POA: Diagnosis not present

## 2018-12-17 DIAGNOSIS — D631 Anemia in chronic kidney disease: Secondary | ICD-10-CM | POA: Diagnosis not present

## 2018-12-17 DIAGNOSIS — E1022 Type 1 diabetes mellitus with diabetic chronic kidney disease: Secondary | ICD-10-CM | POA: Diagnosis not present

## 2018-12-17 DIAGNOSIS — T82818A Embolism of vascular prosthetic devices, implants and grafts, initial encounter: Secondary | ICD-10-CM | POA: Diagnosis not present

## 2018-12-17 NOTE — H&P (View-Only) (Signed)
Patient name: Dale [name] MRN: [id number] DOB: [birth year] Sex: male  REASON FOR VISIT:     post op  HISTORY OF PRESENT ILLNESS:   Dale [name] is a 53 y.o. male who is status post first stage left BVT on 11-01-2018. His right central venous system is occluded based on venography  On 10-16-2018   CURRENT MEDICATIONS:    Current Outpatient Medications  Medication Sig Dispense Refill  . acetaminophen (TYLENOL) 500 MG tablet Take 1,000 mg by mouth at bedtime.    [name] Kitchen allopurinol (ZYLOPRIM) 100 MG tablet Take 2 tablets (200 mg total) by mouth daily. 180 tablet 3  . aspirin EC 81 MG tablet Take 81 mg by mouth at bedtime.    [name] Kitchen atorvastatin (LIPITOR) 10 MG tablet Take 10 mg by mouth daily.     . calcium acetate (PHOSLO) 667 MG capsule Take 1,334 mg by mouth 3 (three) times daily with meals.     . cephALEXin (KEFLEX) 500 MG capsule Take 1 capsule (500 mg total) by mouth 2 (two) times daily. 20 capsule 0  . cetirizine (ZYRTEC) 10 MG tablet Take 10 mg by mouth daily.    . diphenhydrAMINE (BENADRYL) 25 MG tablet Take 50 mg by mouth at bedtime.     . docusate sodium (COLACE) 100 MG capsule Take 100 mg by mouth 2 (two) times daily.     [name] Kitchen esomeprazole (NEXIUM) 20 MG capsule Take 20 mg by mouth daily at 12 noon.    . famotidine (PEPCID) 20 MG tablet Take 20 mg by mouth daily as needed for heartburn or indigestion.     . fluticasone (FLONASE) 50 MCG/ACT nasal spray Place 2 sprays into both nostrils daily as needed for allergies.     [name] Kitchen gabapentin (NEURONTIN) 300 MG capsule Take 300 mg by mouth at bedtime.    [name] Kitchen GLUCAGON EMERGENCY 1 MG injection Inject 1 mg into the muscle daily as needed (low blood sugar).     . hydrocortisone 2.5 % ointment Apply 1 application topically 2 (two) times daily as needed (itching).    [name] Kitchen HYDROmorphone (DILAUDID) 2 MG tablet Take 2 mg by mouth 2 (two) times daily.     . hydrOXYzine (ATARAX/VISTARIL) 10 MG tablet Take 10 mg by mouth 3  (three) times daily as needed for itching.    Vanessa Kick Ethyl (VASCEPA) 1 g CAPS     . insulin lispro (HUMALOG) 100 UNIT/ML injection Inject 75-120 Units into the skin continuous. Per insulin pump    . levothyroxine (SYNTHROID, LEVOTHROID) 112 MCG tablet Take 112 mcg by mouth at bedtime.     . Melatonin 5 MG TABS Take 5 mg by mouth at bedtime.    . metoCLOPramide (REGLAN) 10 MG tablet Take 10 mg by mouth 4 (four) times daily -  before meals and at bedtime.     . midodrine (PROAMATINE) 10 MG tablet Take 1 tablet (10 mg total) by mouth 3 (three) times daily. Takes 1 tab in mornings of dialysis and takes 2 tabs 30 mins before starting dialysis (m,w,f)    . multivitamin (RENA-VIT) TABS tablet Take 1 tablet by mouth daily.    . nitroGLYCERIN (NITROSTAT) 0.4 MG SL tablet Place 1 tablet (0.4 mg total) under the tongue every 5 (five) minutes as needed for chest pain. 25 tablet 4  . Nutritional Supplements (FEEDING SUPPLEMENT, NEPRO CARB STEADY,) LIQD Take 237 mLs by mouth daily. (Patient taking differently: Take 237 mLs by mouth 3 (three) times a  week. ) 30 Can 0  . nystatin (NYSTATIN) powder Apply 1 g topically daily as needed (yeast).    Vladimir Faster Glycol-Propyl Glycol (SYSTANE) 0.4-0.3 % SOLN Place 1 drop into both eyes at bedtime.     . polyethylene glycol (MIRALAX / GLYCOLAX) packet Take 17 g by mouth daily. (Patient taking differently: Take 17 g by mouth daily as needed for mild constipation. ) 14 each 0  . prasugrel (EFFIENT) 10 MG TABS tablet TAKE 1 TABLET BY MOUTH EVERY DAY (Patient taking differently: Take 10 mg by mouth daily. ) 90 tablet 0  . promethazine (PHENERGAN) 25 MG tablet Take 25 mg by mouth every 6 (six) hours as needed for nausea or vomiting.    . sodium chloride (MURO 128) 5 % ophthalmic ointment Place 1 drop into both eyes at bedtime.     [name] Kitchen testosterone cypionate (DEPOTESTOSTERONE CYPIONATE) 200 MG/ML injection Inject 100 mg into the muscle every 14 (fourteen) days.   1  .  VELPHORO 500 MG chewable tablet Chew 500-1,000 mg by mouth See admin instructions. Chew 1,000 mg by mouth three times a day with meals and 500 mg twice daily with snacks  10  . rOPINIRole (REQUIP) 2 MG tablet Take 1 tablet (2 mg total) by mouth at bedtime. Take 2 mg by mouth every night at bedtime 30 tablet 0   No current facility-administered medications for this visit.     REVIEW OF SYSTEMS:   [X]  denotes positive finding, [ ]  denotes negative finding Cardiac  Comments:  Chest pain or chest pressure:    Shortness of breath upon exertion:    Short of breath when lying flat:    Irregular heart rhythm:    Constitutional    Fever or chills:      PHYSICAL EXAM:   Vitals:   12/17/18 1552  BP: 139/75  Pulse: 94  Resp: 20  SpO2: 93%  Weight: 280 lb 13.9 oz (127.4 kg)  Height: 5\' 8"  (1.727 m)    GENERAL: The patient is a well-nourished male, in no acute distress. The vital signs are documented above. CARDIOVASCULAR: There is a regular rate and rhythm. PULMONARY: Non-labored respirations   STUDIES:   OUTFLOW VEINPSV (cm/s)Diameter (cm)Depth (cm)    Describe     +------------+----------+-------------+----------+----------------+ Prox UA         83        0.58        3.04                    +------------+----------+-------------+----------+----------------+ Mid UA         337        0.61        3.08                    +------------+----------+-------------+----------+----------------+ Dist UA        340        0.67        2.83                    +------------+----------+-------------+----------+----------------+ AC Fossa       416        0.24        1.38   competing branch +------------+----------+-------------+----------+----------------+    MEDICAL ISSUES:   I evaluated his fistula with the Sonosight.  The vein appears adequate for second stage.  This is planned for Thursday September 3.  If the vein turns out to not be  adequate, we discussed  placing a graft.  He will be off his Effient  Annamarie Major, IV, MD, FACS [facility] [phone] Pager [phone]

## 2018-12-17 NOTE — Progress Notes (Signed)
Patient name: Dale [name] MRN: [id number] DOB: Jun 09, 1965 Sex: male  REASON FOR VISIT:     post op  HISTORY OF PRESENT ILLNESS:   Dale [name] is a 53 y.o. male who is status post first stage left BVT on 11-01-2018. His right central venous system is occluded based on venography  On 10-16-2018   CURRENT MEDICATIONS:    Current Outpatient Medications  Medication Sig Dispense Refill  . acetaminophen (TYLENOL) 500 MG tablet Take 1,000 mg by mouth at bedtime.    [name] Kitchen allopurinol (ZYLOPRIM) 100 MG tablet Take 2 tablets (200 mg total) by mouth daily. 180 tablet 3  . aspirin EC 81 MG tablet Take 81 mg by mouth at bedtime.    [name] Kitchen atorvastatin (LIPITOR) 10 MG tablet Take 10 mg by mouth daily.     . calcium acetate (PHOSLO) 667 MG capsule Take 1,334 mg by mouth 3 (three) times daily with meals.     . cephALEXin (KEFLEX) 500 MG capsule Take 1 capsule (500 mg total) by mouth 2 (two) times daily. 20 capsule 0  . cetirizine (ZYRTEC) 10 MG tablet Take 10 mg by mouth daily.    . diphenhydrAMINE (BENADRYL) 25 MG tablet Take 50 mg by mouth at bedtime.     . docusate sodium (COLACE) 100 MG capsule Take 100 mg by mouth 2 (two) times daily.     [name] Kitchen esomeprazole (NEXIUM) 20 MG capsule Take 20 mg by mouth daily at 12 noon.    . famotidine (PEPCID) 20 MG tablet Take 20 mg by mouth daily as needed for heartburn or indigestion.     . fluticasone (FLONASE) 50 MCG/ACT nasal spray Place 2 sprays into both nostrils daily as needed for allergies.     [name] Kitchen gabapentin (NEURONTIN) 300 MG capsule Take 300 mg by mouth at bedtime.    [name] Kitchen GLUCAGON EMERGENCY 1 MG injection Inject 1 mg into the muscle daily as needed (low blood sugar).     . hydrocortisone 2.5 % ointment Apply 1 application topically 2 (two) times daily as needed (itching).    [name] Kitchen HYDROmorphone (DILAUDID) 2 MG tablet Take 2 mg by mouth 2 (two) times daily.     . hydrOXYzine (ATARAX/VISTARIL) 10 MG tablet Take 10 mg by mouth 3  (three) times daily as needed for itching.    Vanessa Kick Ethyl (VASCEPA) 1 g CAPS     . insulin lispro (HUMALOG) 100 UNIT/ML injection Inject 75-120 Units into the skin continuous. Per insulin pump    . levothyroxine (SYNTHROID, LEVOTHROID) 112 MCG tablet Take 112 mcg by mouth at bedtime.     . Melatonin 5 MG TABS Take 5 mg by mouth at bedtime.    . metoCLOPramide (REGLAN) 10 MG tablet Take 10 mg by mouth 4 (four) times daily -  before meals and at bedtime.     . midodrine (PROAMATINE) 10 MG tablet Take 1 tablet (10 mg total) by mouth 3 (three) times daily. Takes 1 tab in mornings of dialysis and takes 2 tabs 30 mins before starting dialysis (m,w,f)    . multivitamin (RENA-VIT) TABS tablet Take 1 tablet by mouth daily.    . nitroGLYCERIN (NITROSTAT) 0.4 MG SL tablet Place 1 tablet (0.4 mg total) under the tongue every 5 (five) minutes as needed for chest pain. 25 tablet 4  . Nutritional Supplements (FEEDING SUPPLEMENT, NEPRO CARB STEADY,) LIQD Take 237 mLs by mouth daily. (Patient taking differently: Take 237 mLs by mouth 3 (three) times a  week. ) 30 Can 0  . nystatin (NYSTATIN) powder Apply 1 g topically daily as needed (yeast).    Vladimir Faster Glycol-Propyl Glycol (SYSTANE) 0.4-0.3 % SOLN Place 1 drop into both eyes at bedtime.     . polyethylene glycol (MIRALAX / GLYCOLAX) packet Take 17 g by mouth daily. (Patient taking differently: Take 17 g by mouth daily as needed for mild constipation. ) 14 each 0  . prasugrel (EFFIENT) 10 MG TABS tablet TAKE 1 TABLET BY MOUTH EVERY DAY (Patient taking differently: Take 10 mg by mouth daily. ) 90 tablet 0  . promethazine (PHENERGAN) 25 MG tablet Take 25 mg by mouth every 6 (six) hours as needed for nausea or vomiting.    . sodium chloride (MURO 128) 5 % ophthalmic ointment Place 1 drop into both eyes at bedtime.     [name] Kitchen testosterone cypionate (DEPOTESTOSTERONE CYPIONATE) 200 MG/ML injection Inject 100 mg into the muscle every 14 (fourteen) days.   1  .  VELPHORO 500 MG chewable tablet Chew 500-1,000 mg by mouth See admin instructions. Chew 1,000 mg by mouth three times a day with meals and 500 mg twice daily with snacks  10  . rOPINIRole (REQUIP) 2 MG tablet Take 1 tablet (2 mg total) by mouth at bedtime. Take 2 mg by mouth every night at bedtime 30 tablet 0   No current facility-administered medications for this visit.     REVIEW OF SYSTEMS:   [X]  denotes positive finding, [ ]  denotes negative finding Cardiac  Comments:  Chest pain or chest pressure:    Shortness of breath upon exertion:    Short of breath when lying flat:    Irregular heart rhythm:    Constitutional    Fever or chills:      PHYSICAL EXAM:   Vitals:   12/17/18 1552  BP: 139/75  Pulse: 94  Resp: 20  SpO2: 93%  Weight: 280 lb 13.9 oz (127.4 kg)  Height: 5\' 8"  (1.727 m)    GENERAL: The patient is a well-nourished male, in no acute distress. The vital signs are documented above. CARDIOVASCULAR: There is a regular rate and rhythm. PULMONARY: Non-labored respirations   STUDIES:   OUTFLOW VEINPSV (cm/s)Diameter (cm)Depth (cm)    Describe     +------------+----------+-------------+----------+----------------+ Prox UA         83        0.58        3.04                    +------------+----------+-------------+----------+----------------+ Mid UA         337        0.61        3.08                    +------------+----------+-------------+----------+----------------+ Dist UA        340        0.67        2.83                    +------------+----------+-------------+----------+----------------+ AC Fossa       416        0.24        1.38   competing branch +------------+----------+-------------+----------+----------------+    MEDICAL ISSUES:   I evaluated his fistula with the Sonosight.  The vein appears adequate for second stage.  This is planned for Thursday September 3.  If the vein turns out to not be  adequate, we discussed  placing a graft.  He will be off his Effient  Annamarie Major, IV, MD, FACS [facility] [phone] Pager [phone]

## 2018-12-18 ENCOUNTER — Other Ambulatory Visit: Payer: Self-pay | Admitting: *Deleted

## 2018-12-19 DIAGNOSIS — Z992 Dependence on renal dialysis: Secondary | ICD-10-CM | POA: Diagnosis not present

## 2018-12-19 DIAGNOSIS — N186 End stage renal disease: Secondary | ICD-10-CM | POA: Diagnosis not present

## 2018-12-19 DIAGNOSIS — D631 Anemia in chronic kidney disease: Secondary | ICD-10-CM | POA: Diagnosis not present

## 2018-12-19 DIAGNOSIS — T82818A Embolism of vascular prosthetic devices, implants and grafts, initial encounter: Secondary | ICD-10-CM | POA: Diagnosis not present

## 2018-12-19 DIAGNOSIS — D689 Coagulation defect, unspecified: Secondary | ICD-10-CM | POA: Diagnosis not present

## 2018-12-19 DIAGNOSIS — E1022 Type 1 diabetes mellitus with diabetic chronic kidney disease: Secondary | ICD-10-CM | POA: Diagnosis not present

## 2018-12-19 DIAGNOSIS — N2581 Secondary hyperparathyroidism of renal origin: Secondary | ICD-10-CM | POA: Diagnosis not present

## 2018-12-21 DIAGNOSIS — Z992 Dependence on renal dialysis: Secondary | ICD-10-CM | POA: Diagnosis not present

## 2018-12-21 DIAGNOSIS — D631 Anemia in chronic kidney disease: Secondary | ICD-10-CM | POA: Diagnosis not present

## 2018-12-21 DIAGNOSIS — T82818A Embolism of vascular prosthetic devices, implants and grafts, initial encounter: Secondary | ICD-10-CM | POA: Diagnosis not present

## 2018-12-21 DIAGNOSIS — N2581 Secondary hyperparathyroidism of renal origin: Secondary | ICD-10-CM | POA: Diagnosis not present

## 2018-12-21 DIAGNOSIS — D689 Coagulation defect, unspecified: Secondary | ICD-10-CM | POA: Diagnosis not present

## 2018-12-21 DIAGNOSIS — N186 End stage renal disease: Secondary | ICD-10-CM | POA: Diagnosis not present

## 2018-12-21 DIAGNOSIS — E1022 Type 1 diabetes mellitus with diabetic chronic kidney disease: Secondary | ICD-10-CM | POA: Diagnosis not present

## 2018-12-24 ENCOUNTER — Other Ambulatory Visit (HOSPITAL_COMMUNITY)
Admission: RE | Admit: 2018-12-24 | Discharge: 2018-12-24 | Disposition: A | Discharge status: Home / Self Care | Payer: Medicare Other | Source: Ambulatory Visit | Attending: Surgery | Admitting: Surgery

## 2018-12-24 DIAGNOSIS — T8612 Kidney transplant failure: Secondary | ICD-10-CM | POA: Diagnosis not present

## 2018-12-24 DIAGNOSIS — Z01812 Encounter for preprocedural laboratory examination: Secondary | ICD-10-CM | POA: Diagnosis not present

## 2018-12-24 DIAGNOSIS — N186 End stage renal disease: Secondary | ICD-10-CM | POA: Diagnosis not present

## 2018-12-24 DIAGNOSIS — D689 Coagulation defect, unspecified: Secondary | ICD-10-CM | POA: Diagnosis not present

## 2018-12-24 DIAGNOSIS — E1022 Type 1 diabetes mellitus with diabetic chronic kidney disease: Secondary | ICD-10-CM | POA: Diagnosis not present

## 2018-12-24 DIAGNOSIS — Z992 Dependence on renal dialysis: Secondary | ICD-10-CM | POA: Diagnosis not present

## 2018-12-24 DIAGNOSIS — Z20828 Contact with and (suspected) exposure to other viral communicable diseases: Secondary | ICD-10-CM | POA: Diagnosis not present

## 2018-12-24 DIAGNOSIS — D631 Anemia in chronic kidney disease: Secondary | ICD-10-CM | POA: Diagnosis not present

## 2018-12-24 DIAGNOSIS — T82818A Embolism of vascular prosthetic devices, implants and grafts, initial encounter: Secondary | ICD-10-CM | POA: Diagnosis not present

## 2018-12-24 DIAGNOSIS — N2581 Secondary hyperparathyroidism of renal origin: Secondary | ICD-10-CM | POA: Diagnosis not present

## 2018-12-24 LAB — SARS CORONAVIRUS 2 (TAT 6-24 HRS): SARS Coronavirus 2: NEGATIVE

## 2018-12-25 ENCOUNTER — Other Ambulatory Visit: Payer: Self-pay

## 2018-12-25 NOTE — Progress Notes (Signed)
Spoke with pt for pre-op call. Pt has hx of CAD with stents, pt's cardiologist is Dr. [name]. Pt denies any recent chest pain or sob. Pt is a Type 1 diabetic with an insulin pump. Last A1C was 7.3 on 11/15/18. Instructed pt to reduce his basal rate on his insulin pump by 20% at midnight Wednesday night. Instructed pt to check his blood sugar when he gets up Thursday AM. If blood sugar is >220 take 1/2 of usual correction dose of Novolog insulin. If blood sugar is 70 or below, treat with 1/2 cup of clear juice (apple or cranberry) and recheck blood sugar 15 minutes after drinking juice. Pt voiced understanding.  Pt had Covid test done yesterday. It is negative. He states he is in quarantine. He goes to Dialysis tomorrow and knows to wear his mask.    Coronavirus Screening  Have you experienced the following symptoms:  Cough yes/no: No Fever (>100.66F)  yes/no: No Runny nose yes/no: No Sore throat yes/no: No Difficulty breathing/shortness of breath  yes/no: No  Have you or a family member traveled in the last 14 days and where? yes/no: No   Pt reminded that hospital visitation restrictions are in effect and the importance of the restrictions. Informed pt that he may have 1 visitor wait in the waiting area while he is in pre-op, surgery and PACU. Pt voiced understanding.

## 2018-12-25 NOTE — Progress Notes (Signed)
Had paged the Diabetic coordinator around 3 PM. Just found a voicemail from [place], Diabetic coordinator stating that I could call and leave a message at [phone] with pt's name, DOB, MRN and date and time of surgery. I did leave message as requested.

## 2018-12-26 DIAGNOSIS — D689 Coagulation defect, unspecified: Secondary | ICD-10-CM | POA: Diagnosis not present

## 2018-12-26 DIAGNOSIS — L299 Pruritus, unspecified: Secondary | ICD-10-CM | POA: Diagnosis not present

## 2018-12-26 DIAGNOSIS — N2581 Secondary hyperparathyroidism of renal origin: Secondary | ICD-10-CM | POA: Diagnosis not present

## 2018-12-26 DIAGNOSIS — Z992 Dependence on renal dialysis: Secondary | ICD-10-CM | POA: Diagnosis not present

## 2018-12-26 DIAGNOSIS — E1022 Type 1 diabetes mellitus with diabetic chronic kidney disease: Secondary | ICD-10-CM | POA: Diagnosis not present

## 2018-12-26 DIAGNOSIS — Z23 Encounter for immunization: Secondary | ICD-10-CM | POA: Diagnosis not present

## 2018-12-26 DIAGNOSIS — T82818A Embolism of vascular prosthetic devices, implants and grafts, initial encounter: Secondary | ICD-10-CM | POA: Diagnosis not present

## 2018-12-26 DIAGNOSIS — N186 End stage renal disease: Secondary | ICD-10-CM | POA: Diagnosis not present

## 2018-12-26 DIAGNOSIS — D631 Anemia in chronic kidney disease: Secondary | ICD-10-CM | POA: Diagnosis not present

## 2018-12-26 MED ORDER — VANCOMYCIN HCL 10 G IV SOLR
1500.0000 mg | INTRAVENOUS | Status: AC
  Administered 2018-12-27: 1500 mg via INTRAVENOUS
  Filled 2018-12-26: qty 1500

## 2018-12-26 NOTE — Anesthesia Preprocedure Evaluation (Addendum)
Anesthesia Evaluation  Patient identified by MRN, date of birth, ID band Patient awake    Reviewed: Allergy & Precautions, NPO status , Patient's Chart, lab work & pertinent test results  History of Anesthesia Complications (+) PONV and DIFFICULT AIRWAY  Airway Mallampati: III  TM Distance: >3 FB Neck ROM: Full    Dental  (+) Dental Advisory Given   Pulmonary sleep apnea and Oxygen sleep apnea ,    breath sounds clear to auscultation       Cardiovascular hypertension, + CAD, + Past MI, + Cardiac Stents, + Peripheral Vascular Disease and +CHF   Rhythm:Regular Rate:Normal     Neuro/Psych negative neurological ROS     GI/Hepatic Neg liver ROS, GERD  ,Gastroparesis    Endo/Other  diabetes, Type 2, Insulin DependentHypothyroidism Morbid obesity  Renal/GU ESRF and DialysisRenal disease     Musculoskeletal  (+) Arthritis , Fibromyalgia -  Abdominal   Peds  Hematology negative hematology ROS (+)   Anesthesia Other Findings   Reproductive/Obstetrics                            Lab Results  Component Value Date   WBC 7.5 08/02/2018   HGB 14.3 12/27/2018   HCT 42.0 12/27/2018   MCV 106.1 (H) 08/02/2018   PLT 232 08/02/2018   Lab Results  Component Value Date   CREATININE 9.50 (H) 10/16/2018   BUN 91 (H) 10/16/2018   NA 137 12/27/2018   K 4.2 12/27/2018   CL 100 10/16/2018   CO2 23 08/03/2018    Anesthesia Physical Anesthesia Plan  ASA: III  Anesthesia Plan: General   Post-op Pain Management:    Induction: Intravenous  PONV Risk Score and Plan: 3 and Midazolam, Dexamethasone, Ondansetron and Treatment may vary due to age or medical condition  Airway Management Planned: Oral ETT and Video Laryngoscope Planned  Additional Equipment:   Intra-op Plan:   Post-operative Plan: Extubation in OR  Informed Consent: I have reviewed the patients History and Physical, chart, labs and  discussed the procedure including the risks, benefits and alternatives for the proposed anesthesia with the patient or authorized representative who has indicated his/her understanding and acceptance.     Dental advisory given  Plan Discussed with: CRNA  Anesthesia Plan Comments:        Anesthesia Quick Evaluation

## 2018-12-27 ENCOUNTER — Ambulatory Visit (HOSPITAL_COMMUNITY): Payer: Medicare Other | Admitting: Anesthesiology

## 2018-12-27 ENCOUNTER — Encounter (HOSPITAL_COMMUNITY)
Admission: RE | Disposition: A | Discharge status: Home / Self Care | Payer: Self-pay | Source: Home / Self Care | Attending: Surgery

## 2018-12-27 ENCOUNTER — Encounter (HOSPITAL_COMMUNITY): Payer: Self-pay | Admitting: Certified Registered"

## 2018-12-27 ENCOUNTER — Observation Stay (HOSPITAL_COMMUNITY)
Admission: RE | Admit: 2018-12-27 | Discharge: 2018-12-28 | Disposition: A | Discharge status: Home / Self Care | Payer: Medicare Other | Attending: Surgery | Admitting: Surgery

## 2018-12-27 ENCOUNTER — Other Ambulatory Visit: Payer: Self-pay

## 2018-12-27 DIAGNOSIS — I2510 Atherosclerotic heart disease of native coronary artery without angina pectoris: Secondary | ICD-10-CM | POA: Insufficient documentation

## 2018-12-27 DIAGNOSIS — Z79890 Hormone replacement therapy (postmenopausal): Secondary | ICD-10-CM | POA: Diagnosis not present

## 2018-12-27 DIAGNOSIS — I509 Heart failure, unspecified: Secondary | ICD-10-CM | POA: Insufficient documentation

## 2018-12-27 DIAGNOSIS — E1022 Type 1 diabetes mellitus with diabetic chronic kidney disease: Secondary | ICD-10-CM | POA: Diagnosis not present

## 2018-12-27 DIAGNOSIS — Z7982 Long term (current) use of aspirin: Secondary | ICD-10-CM | POA: Insufficient documentation

## 2018-12-27 DIAGNOSIS — G4730 Sleep apnea, unspecified: Secondary | ICD-10-CM | POA: Insufficient documentation

## 2018-12-27 DIAGNOSIS — I739 Peripheral vascular disease, unspecified: Secondary | ICD-10-CM | POA: Insufficient documentation

## 2018-12-27 DIAGNOSIS — K3184 Gastroparesis: Secondary | ICD-10-CM | POA: Diagnosis not present

## 2018-12-27 DIAGNOSIS — Z794 Long term (current) use of insulin: Secondary | ICD-10-CM | POA: Insufficient documentation

## 2018-12-27 DIAGNOSIS — M797 Fibromyalgia: Secondary | ICD-10-CM | POA: Diagnosis not present

## 2018-12-27 DIAGNOSIS — I132 Hypertensive heart and chronic kidney disease with heart failure and with stage 5 chronic kidney disease, or end stage renal disease: Principal | ICD-10-CM | POA: Insufficient documentation

## 2018-12-27 DIAGNOSIS — E039 Hypothyroidism, unspecified: Secondary | ICD-10-CM | POA: Diagnosis not present

## 2018-12-27 DIAGNOSIS — Z7951 Long term (current) use of inhaled steroids: Secondary | ICD-10-CM | POA: Insufficient documentation

## 2018-12-27 DIAGNOSIS — M1990 Unspecified osteoarthritis, unspecified site: Secondary | ICD-10-CM | POA: Insufficient documentation

## 2018-12-27 DIAGNOSIS — Z6841 Body Mass Index (BMI) 40.0 and over, adult: Secondary | ICD-10-CM | POA: Diagnosis not present

## 2018-12-27 DIAGNOSIS — Z955 Presence of coronary angioplasty implant and graft: Secondary | ICD-10-CM | POA: Insufficient documentation

## 2018-12-27 DIAGNOSIS — N186 End stage renal disease: Secondary | ICD-10-CM | POA: Diagnosis not present

## 2018-12-27 DIAGNOSIS — K219 Gastro-esophageal reflux disease without esophagitis: Secondary | ICD-10-CM | POA: Diagnosis not present

## 2018-12-27 DIAGNOSIS — Z79899 Other long term (current) drug therapy: Secondary | ICD-10-CM | POA: Insufficient documentation

## 2018-12-27 DIAGNOSIS — N185 Chronic kidney disease, stage 5: Secondary | ICD-10-CM | POA: Diagnosis not present

## 2018-12-27 HISTORY — PX: BASCILIC VEIN TRANSPOSITION: SHX5742

## 2018-12-27 LAB — HEMOGLOBIN A1C
Hgb A1c MFr Bld: 7.2 % — ABNORMAL HIGH (ref 4.8–5.6)
Mean Plasma Glucose: 159.94 mg/dL

## 2018-12-27 LAB — POCT I-STAT 4, (NA,K, GLUC, HGB,HCT)
Glucose, Bld: 172 mg/dL — ABNORMAL HIGH (ref 70–99)
HCT: 42 % (ref 39.0–52.0)
Hemoglobin: 14.3 g/dL (ref 13.0–17.0)
Potassium: 4.2 mmol/L (ref 3.5–5.1)
Sodium: 137 mmol/L (ref 135–145)

## 2018-12-27 LAB — GLUCOSE, CAPILLARY
Glucose-Capillary: 169 mg/dL — ABNORMAL HIGH (ref 70–99)
Glucose-Capillary: 158 mg/dL — ABNORMAL HIGH (ref 70–99)
Glucose-Capillary: 174 mg/dL — ABNORMAL HIGH (ref 70–99)
Glucose-Capillary: 202 mg/dL — ABNORMAL HIGH (ref 70–99)

## 2018-12-27 SURGERY — TRANSPOSITION, VEIN, BASILIC
Anesthesia: General | Site: Arm Upper | Laterality: Left

## 2018-12-27 MED ORDER — PROTAMINE SULFATE 10 MG/ML IV SOLN
INTRAVENOUS | Status: AC
  Filled 2018-12-27: qty 5

## 2018-12-27 MED ORDER — HYDROXYZINE HCL 10 MG PO TABS
10.0000 mg | ORAL_TABLET | Freq: Three times a day (TID) | ORAL | Status: DC | PRN
  Filled 2018-12-27: qty 1

## 2018-12-27 MED ORDER — MIDAZOLAM HCL 2 MG/2ML IJ SOLN
INTRAMUSCULAR | Status: AC
  Filled 2018-12-27: qty 2

## 2018-12-27 MED ORDER — INSULIN ASPART 100 UNIT/ML ~~LOC~~ SOLN: 0.0000 [IU] | Freq: Three times a day (TID) | SUBCUTANEOUS | Status: DC

## 2018-12-27 MED ORDER — GUAIFENESIN-DM 100-10 MG/5ML PO SYRP: 15.0000 mL | ORAL_SOLUTION | ORAL | Status: DC | PRN

## 2018-12-27 MED ORDER — ACETAMINOPHEN 500 MG PO TABS: 1000.0000 mg | ORAL_TABLET | ORAL | Status: DC

## 2018-12-27 MED ORDER — MIDODRINE HCL 5 MG PO TABS
10.0000 mg | ORAL_TABLET | ORAL | Status: DC
  Filled 2018-12-27: qty 2

## 2018-12-27 MED ORDER — HYDROMORPHONE HCL 1 MG/ML IJ SOLN
0.2500 mg | INTRAMUSCULAR | Status: DC | PRN
  Administered 2018-12-27 (×3): 0.5 mg via INTRAVENOUS

## 2018-12-27 MED ORDER — PANTOPRAZOLE SODIUM 40 MG PO TBEC
40.0000 mg | DELAYED_RELEASE_TABLET | Freq: Every day | ORAL | Status: DC
  Administered 2018-12-28: 40 mg via ORAL
  Filled 2018-12-27: qty 1

## 2018-12-27 MED ORDER — MELATONIN 3 MG PO TABS
3.0000 mg | ORAL_TABLET | Freq: Every day | ORAL | Status: DC
  Administered 2018-12-27: 3 mg via ORAL
  Filled 2018-12-27 (×2): qty 1

## 2018-12-27 MED ORDER — ASPIRIN EC 81 MG PO TBEC: 81.0000 mg | DELAYED_RELEASE_TABLET | Freq: Every day | ORAL | Status: DC

## 2018-12-27 MED ORDER — LIDOCAINE-EPINEPHRINE (PF) 1 %-1:200000 IJ SOLN
INTRAMUSCULAR | Status: AC
  Filled 2018-12-27: qty 30

## 2018-12-27 MED ORDER — CHLORHEXIDINE GLUCONATE 4 % EX LIQD: 60.0000 mL | Freq: Once | CUTANEOUS | Status: DC

## 2018-12-27 MED ORDER — ONDANSETRON HCL 4 MG/2ML IJ SOLN: 4.0000 mg | Freq: Four times a day (QID) | INTRAMUSCULAR | Status: DC | PRN

## 2018-12-27 MED ORDER — HYDRALAZINE HCL 20 MG/ML IJ SOLN: 5.0000 mg | INTRAMUSCULAR | Status: DC | PRN

## 2018-12-27 MED ORDER — SODIUM CHLORIDE 0.9 % IV SOLN
INTRAVENOUS | Status: DC | PRN
  Administered 2018-12-27: 500 mL

## 2018-12-27 MED ORDER — DIPHENHYDRAMINE HCL 25 MG PO CAPS
50.0000 mg | ORAL_CAPSULE | Freq: Every day | ORAL | Status: DC
  Administered 2018-12-27: 50 mg via ORAL
  Filled 2018-12-27: qty 2

## 2018-12-27 MED ORDER — PROPOFOL 10 MG/ML IV BOLUS
INTRAVENOUS | Status: AC
  Filled 2018-12-27: qty 20

## 2018-12-27 MED ORDER — SODIUM CHLORIDE 0.9 % IV SOLN
INTRAVENOUS | Status: DC
  Administered 2018-12-27: 07:00:00 via INTRAVENOUS

## 2018-12-27 MED ORDER — NEPRO/CARBSTEADY PO LIQD
237.0000 mL | ORAL | Status: DC
  Administered 2018-12-27: 237 mL via ORAL

## 2018-12-27 MED ORDER — PHENOL 1.4 % MT LIQD: 1.0000 | OROMUCOSAL | Status: DC | PRN

## 2018-12-27 MED ORDER — ROPINIROLE HCL 1 MG PO TABS
2.0000 mg | ORAL_TABLET | Freq: Every day | ORAL | Status: DC
  Administered 2018-12-27: 2 mg via ORAL
  Filled 2018-12-27: qty 2

## 2018-12-27 MED ORDER — RENA-VITE PO TABS
1.0000 | ORAL_TABLET | Freq: Every day | ORAL | Status: DC
  Administered 2018-12-27: 1 via ORAL
  Filled 2018-12-27: qty 1

## 2018-12-27 MED ORDER — NYSTATIN 100000 UNIT/GM EX POWD: 1.0000 g | Freq: Every day | CUTANEOUS | Status: DC | PRN

## 2018-12-27 MED ORDER — 0.9 % SODIUM CHLORIDE (POUR BTL) OPTIME
TOPICAL | Status: DC | PRN
  Administered 2018-12-27: 1000 mL

## 2018-12-27 MED ORDER — LEVOTHYROXINE SODIUM 112 MCG PO TABS
112.0000 ug | ORAL_TABLET | Freq: Every day | ORAL | Status: DC
  Administered 2018-12-27: 112 ug via ORAL
  Filled 2018-12-27: qty 1

## 2018-12-27 MED ORDER — SODIUM CHLORIDE 0.9 % IV SOLN
INTRAVENOUS | Status: AC
  Filled 2018-12-27: qty 1.2

## 2018-12-27 MED ORDER — LOPERAMIDE HCL 2 MG PO CAPS: 2.0000 mg | ORAL_CAPSULE | Freq: Four times a day (QID) | ORAL | Status: DC | PRN

## 2018-12-27 MED ORDER — ATORVASTATIN CALCIUM 10 MG PO TABS
10.0000 mg | ORAL_TABLET | Freq: Every day | ORAL | Status: DC
  Administered 2018-12-27: 10 mg via ORAL
  Filled 2018-12-27: qty 1

## 2018-12-27 MED ORDER — FENTANYL CITRATE (PF) 100 MCG/2ML IJ SOLN
INTRAMUSCULAR | Status: AC
  Filled 2018-12-27: qty 2

## 2018-12-27 MED ORDER — LABETALOL HCL 5 MG/ML IV SOLN: 10.0000 mg | INTRAVENOUS | Status: DC | PRN

## 2018-12-27 MED ORDER — VANCOMYCIN HCL IN DEXTROSE 1-5 GM/200ML-% IV SOLN
1000.0000 mg | Freq: Once | INTRAVENOUS | Status: AC
  Administered 2018-12-28: 1000 mg via INTRAVENOUS
  Filled 2018-12-27: qty 200

## 2018-12-27 MED ORDER — POLYETHYLENE GLYCOL 3350 17 G PO PACK: 17.0000 g | PACK | Freq: Every day | ORAL | Status: DC | PRN

## 2018-12-27 MED ORDER — ONDANSETRON HCL 4 MG/2ML IJ SOLN
INTRAMUSCULAR | Status: DC | PRN
  Administered 2018-12-27: 4 mg via INTRAVENOUS

## 2018-12-27 MED ORDER — MIDODRINE HCL 5 MG PO TABS
10.0000 mg | ORAL_TABLET | ORAL | Status: DC
  Administered 2018-12-28: 10 mg via ORAL
  Filled 2018-12-27: qty 2

## 2018-12-27 MED ORDER — FENTANYL CITRATE (PF) 250 MCG/5ML IJ SOLN
INTRAMUSCULAR | Status: AC
  Filled 2018-12-27: qty 5

## 2018-12-27 MED ORDER — HYDROMORPHONE HCL 1 MG/ML IJ SOLN
INTRAMUSCULAR | Status: AC
  Filled 2018-12-27: qty 1

## 2018-12-27 MED ORDER — LIDOCAINE 2% (20 MG/ML) 5 ML SYRINGE
INTRAMUSCULAR | Status: AC
  Filled 2018-12-27: qty 5

## 2018-12-27 MED ORDER — LORATADINE 10 MG PO TABS
10.0000 mg | ORAL_TABLET | Freq: Every day | ORAL | Status: DC
  Administered 2018-12-28: 10 mg via ORAL
  Filled 2018-12-27: qty 1

## 2018-12-27 MED ORDER — LIDOCAINE 2% (20 MG/ML) 5 ML SYRINGE
INTRAMUSCULAR | Status: DC | PRN
  Administered 2018-12-27: 100 mg via INTRAVENOUS

## 2018-12-27 MED ORDER — DOCUSATE SODIUM 100 MG PO CAPS
200.0000 mg | ORAL_CAPSULE | Freq: Two times a day (BID) | ORAL | Status: DC
  Administered 2018-12-27 – 2018-12-28 (×2): 200 mg via ORAL
  Filled 2018-12-27: qty 2

## 2018-12-27 MED ORDER — HYDROMORPHONE HCL 2 MG PO TABS
2.0000 mg | ORAL_TABLET | Freq: Two times a day (BID) | ORAL | Status: DC | PRN
  Administered 2018-12-27 – 2018-12-28 (×3): 2 mg via ORAL
  Filled 2018-12-27 (×3): qty 1

## 2018-12-27 MED ORDER — METOPROLOL TARTRATE 5 MG/5ML IV SOLN: 2.0000 mg | INTRAVENOUS | Status: DC | PRN

## 2018-12-27 MED ORDER — INSULIN PUMP
SUBCUTANEOUS | Status: DC
  Administered 2018-12-27 – 2018-12-28 (×3): via SUBCUTANEOUS
  Filled 2018-12-27: qty 1

## 2018-12-27 MED ORDER — NITROGLYCERIN 0.4 MG SL SUBL: 0.4000 mg | SUBLINGUAL_TABLET | SUBLINGUAL | Status: DC | PRN

## 2018-12-27 MED ORDER — MIDAZOLAM HCL 5 MG/5ML IJ SOLN
INTRAMUSCULAR | Status: DC | PRN
  Administered 2018-12-27: 2 mg via INTRAVENOUS

## 2018-12-27 MED ORDER — ALLOPURINOL 100 MG PO TABS
200.0000 mg | ORAL_TABLET | Freq: Every day | ORAL | Status: DC
  Administered 2018-12-27 – 2018-12-28 (×2): 200 mg via ORAL
  Filled 2018-12-27 (×2): qty 2

## 2018-12-27 MED ORDER — FENTANYL CITRATE (PF) 100 MCG/2ML IJ SOLN
25.0000 ug | INTRAMUSCULAR | Status: DC | PRN
  Administered 2018-12-27 (×3): 50 ug via INTRAVENOUS

## 2018-12-27 MED ORDER — PHENYLEPHRINE 40 MCG/ML (10ML) SYRINGE FOR IV PUSH (FOR BLOOD PRESSURE SUPPORT)
PREFILLED_SYRINGE | INTRAVENOUS | Status: DC | PRN
  Administered 2018-12-27: 80 ug via INTRAVENOUS

## 2018-12-27 MED ORDER — GABAPENTIN 300 MG PO CAPS
300.0000 mg | ORAL_CAPSULE | Freq: Every day | ORAL | Status: DC
  Administered 2018-12-27: 300 mg via ORAL
  Filled 2018-12-27: qty 1

## 2018-12-27 MED ORDER — CALCIUM ACETATE (PHOS BINDER) 667 MG PO CAPS: 667.0000 mg | ORAL_CAPSULE | ORAL | Status: DC | PRN

## 2018-12-27 MED ORDER — HYDROMORPHONE HCL 1 MG/ML IJ SOLN
0.5000 mg | INTRAMUSCULAR | Status: DC | PRN
  Administered 2018-12-27: 0.5 mg via INTRAVENOUS
  Filled 2018-12-27: qty 1

## 2018-12-27 MED ORDER — SUCCINYLCHOLINE CHLORIDE 200 MG/10ML IV SOSY
PREFILLED_SYRINGE | INTRAVENOUS | Status: DC | PRN
  Administered 2018-12-27: 140 mg via INTRAVENOUS

## 2018-12-27 MED ORDER — POTASSIUM CHLORIDE CRYS ER 20 MEQ PO TBCR: 20.0000 meq | EXTENDED_RELEASE_TABLET | Freq: Once | ORAL | Status: DC

## 2018-12-27 MED ORDER — SUCROFERRIC OXYHYDROXIDE 500 MG PO CHEW
500.0000 mg | CHEWABLE_TABLET | ORAL | Status: DC | PRN
  Filled 2018-12-27: qty 1

## 2018-12-27 MED ORDER — PROMETHAZINE HCL 25 MG PO TABS: 25.0000 mg | ORAL_TABLET | Freq: Four times a day (QID) | ORAL | Status: DC | PRN

## 2018-12-27 MED ORDER — HEPARIN SODIUM (PORCINE) 1000 UNIT/ML IJ SOLN
INTRAMUSCULAR | Status: AC
  Filled 2018-12-27: qty 1

## 2018-12-27 MED ORDER — HEMOSTATIC AGENTS (NO CHARGE) OPTIME
TOPICAL | Status: DC | PRN
  Administered 2018-12-27: 1 via TOPICAL

## 2018-12-27 MED ORDER — ONDANSETRON HCL 4 MG/2ML IJ SOLN
INTRAMUSCULAR | Status: AC
  Filled 2018-12-27: qty 2

## 2018-12-27 MED ORDER — PROPOFOL 10 MG/ML IV BOLUS
INTRAVENOUS | Status: DC | PRN
  Administered 2018-12-27: 20 mg via INTRAVENOUS
  Administered 2018-12-27: 240 mg via INTRAVENOUS

## 2018-12-27 MED ORDER — ACETAMINOPHEN 325 MG PO TABS: 325.0000 mg | ORAL_TABLET | ORAL | Status: DC | PRN

## 2018-12-27 MED ORDER — SUCROFERRIC OXYHYDROXIDE 500 MG PO CHEW
1000.0000 mg | CHEWABLE_TABLET | Freq: Three times a day (TID) | ORAL | Status: DC
  Filled 2018-12-27 (×4): qty 2

## 2018-12-27 MED ORDER — FLUTICASONE PROPIONATE 50 MCG/ACT NA SUSP: 2.0000 | Freq: Every day | NASAL | Status: DC | PRN

## 2018-12-27 MED ORDER — FENTANYL CITRATE (PF) 100 MCG/2ML IJ SOLN
INTRAMUSCULAR | Status: DC | PRN
  Administered 2018-12-27: 50 ug via INTRAVENOUS
  Administered 2018-12-27: 100 ug via INTRAVENOUS
  Administered 2018-12-27: 50 ug via INTRAVENOUS

## 2018-12-27 MED ORDER — ACETAMINOPHEN 325 MG RE SUPP: 325.0000 mg | RECTAL | Status: DC | PRN

## 2018-12-27 MED ORDER — METOCLOPRAMIDE HCL 10 MG PO TABS
10.0000 mg | ORAL_TABLET | Freq: Three times a day (TID) | ORAL | Status: DC
  Administered 2018-12-27 – 2018-12-28 (×3): 10 mg via ORAL
  Filled 2018-12-27 (×3): qty 1

## 2018-12-27 MED ORDER — SUCCINYLCHOLINE CHLORIDE 200 MG/10ML IV SOSY
PREFILLED_SYRINGE | INTRAVENOUS | Status: AC
  Filled 2018-12-27: qty 10

## 2018-12-27 MED ORDER — PROTAMINE SULFATE 10 MG/ML IV SOLN
INTRAVENOUS | Status: DC | PRN
  Administered 2018-12-27: 50 mg via INTRAVENOUS

## 2018-12-27 MED ORDER — HEPARIN SODIUM (PORCINE) 1000 UNIT/ML IJ SOLN
INTRAMUSCULAR | Status: DC | PRN
  Administered 2018-12-27: 2000 [IU] via INTRAVENOUS
  Administered 2018-12-27: 3000 [IU] via INTRAVENOUS

## 2018-12-27 MED ORDER — GLUCAGON (RDNA) 1 MG IJ KIT: 1.0000 mg | PACK | Freq: Every day | INTRAMUSCULAR | Status: DC | PRN

## 2018-12-27 MED ORDER — MIDODRINE HCL 5 MG PO TABS
20.0000 mg | ORAL_TABLET | ORAL | Status: DC
  Filled 2018-12-27: qty 4

## 2018-12-27 MED ORDER — CALCIUM ACETATE (PHOS BINDER) 667 MG PO CAPS
1334.0000 mg | ORAL_CAPSULE | Freq: Three times a day (TID) | ORAL | Status: DC
  Administered 2018-12-27 – 2018-12-28 (×2): 1334 mg via ORAL
  Filled 2018-12-27 (×2): qty 2

## 2018-12-27 MED ORDER — PROMETHAZINE HCL 25 MG/ML IJ SOLN: 6.2500 mg | INTRAMUSCULAR | Status: DC | PRN

## 2018-12-27 MED ORDER — HYDROCORTISONE (PERIANAL) 2.5 % EX CREA
1.0000 "application " | TOPICAL_CREAM | Freq: Two times a day (BID) | CUTANEOUS | Status: DC | PRN
  Filled 2018-12-27: qty 28.35

## 2018-12-27 MED ORDER — PHENYLEPHRINE 40 MCG/ML (10ML) SYRINGE FOR IV PUSH (FOR BLOOD PRESSURE SUPPORT)
PREFILLED_SYRINGE | INTRAVENOUS | Status: AC
  Filled 2018-12-27: qty 10

## 2018-12-27 SURGICAL SUPPLY — 48 items
ADH SKN CLS APL DERMABOND .7 (GAUZE/BANDAGES/DRESSINGS) ×1
ARMBAND PINK RESTRICT EXTREMIT (MISCELLANEOUS) ×2 IMPLANT
BNDG ELASTIC 4X5.8 VLCR STR LF (GAUZE/BANDAGES/DRESSINGS) ×2 IMPLANT
BNDG GAUZE ELAST 4 BULKY (GAUZE/BANDAGES/DRESSINGS) ×1 IMPLANT
CANISTER SUCT 3000ML PPV (MISCELLANEOUS) ×2 IMPLANT
CATH EMB 4FR 40CM (CATHETERS) ×1 IMPLANT
CLIP VESOCCLUDE MED 24/CT (CLIP) IMPLANT
CLIP VESOCCLUDE MED 6/CT (CLIP) IMPLANT
CLIP VESOCCLUDE SM WIDE 24/CT (CLIP) IMPLANT
CLIP VESOCCLUDE SM WIDE 6/CT (CLIP) IMPLANT
COVER PROBE W GEL 5X96 (DRAPES) ×2 IMPLANT
COVER WAND RF STERILE (DRAPES) ×1 IMPLANT
DERMABOND ADVANCED (GAUZE/BANDAGES/DRESSINGS) ×1
DERMABOND ADVANCED .7 DNX12 (GAUZE/BANDAGES/DRESSINGS) ×1 IMPLANT
ELECT REM PT RETURN 9FT ADLT (ELECTROSURGICAL) ×2
ELECTRODE REM PT RTRN 9FT ADLT (ELECTROSURGICAL) ×1 IMPLANT
GAUZE SPONGE 4X4 12PLY STRL (GAUZE/BANDAGES/DRESSINGS) ×1 IMPLANT
GLOVE BIO SURGEON STRL SZ 6.5 (GLOVE) ×2 IMPLANT
GLOVE BIOGEL PI IND STRL 7.5 (GLOVE) ×1 IMPLANT
GLOVE BIOGEL PI INDICATOR 7.5 (GLOVE) ×2
GLOVE ECLIPSE 7.5 STRL STRAW (GLOVE) ×2 IMPLANT
GLOVE SS BIOGEL STRL SZ 6.5 (GLOVE) IMPLANT
GLOVE SUPERSENSE BIOGEL SZ 6.5 (GLOVE) ×1
GLOVE SURG SS PI 7.5 STRL IVOR (GLOVE) ×2 IMPLANT
GOWN STRL REUS W/ TWL LRG LVL3 (GOWN DISPOSABLE) ×2 IMPLANT
GOWN STRL REUS W/ TWL XL LVL3 (GOWN DISPOSABLE) ×1 IMPLANT
GOWN STRL REUS W/TWL LRG LVL3 (GOWN DISPOSABLE) ×4
GOWN STRL REUS W/TWL XL LVL3 (GOWN DISPOSABLE) ×6
HEMOSTAT SNOW SURGICEL 2X4 (HEMOSTASIS) ×1 IMPLANT
KIT BASIN OR (CUSTOM PROCEDURE TRAY) ×2 IMPLANT
KIT TURNOVER KIT B (KITS) ×2 IMPLANT
NS IRRIG 1000ML POUR BTL (IV SOLUTION) ×2 IMPLANT
PACK CV ACCESS (CUSTOM PROCEDURE TRAY) ×2 IMPLANT
PAD ARMBOARD 7.5X6 YLW CONV (MISCELLANEOUS) ×4 IMPLANT
SPONGE LAP 18X18 RF (DISPOSABLE) ×2 IMPLANT
STAPLER VISISTAT 35W (STAPLE) ×2 IMPLANT
SUT PROLENE 6 0 BV (SUTURE) ×2 IMPLANT
SUT PROLENE 6 0 CC (SUTURE) ×2 IMPLANT
SUT SILK 2 0 SH (SUTURE) IMPLANT
SUT VIC AB 2-0 CT1 27 (SUTURE) ×2
SUT VIC AB 2-0 CT1 TAPERPNT 27 (SUTURE) IMPLANT
SUT VIC AB 3-0 SH 27 (SUTURE) ×6
SUT VIC AB 3-0 SH 27X BRD (SUTURE) ×1 IMPLANT
SUT VICRYL 4-0 PS2 18IN ABS (SUTURE) ×2 IMPLANT
SYRINGE 3CC LL L/F (MISCELLANEOUS) ×1 IMPLANT
TOWEL GREEN STERILE (TOWEL DISPOSABLE) ×2 IMPLANT
UNDERPAD 30X30 (UNDERPADS AND DIAPERS) ×2 IMPLANT
WATER STERILE IRR 1000ML POUR (IV SOLUTION) ×2 IMPLANT

## 2018-12-27 NOTE — Anesthesia Postprocedure Evaluation (Signed)
Anesthesia Post Note  Patient: [name]  Procedure(s) Performed: SECOND STAGE BASILIC VEIN TRANSPOSITION  LEFT ARM (Left Arm Upper)     Patient location during evaluation: PACU Anesthesia Type: General Level of consciousness: awake and alert Pain management: pain level controlled Vital Signs Assessment: post-procedure vital signs reviewed and stable Respiratory status: spontaneous breathing, nonlabored ventilation, respiratory function stable and patient connected to nasal cannula oxygen Cardiovascular status: blood pressure returned to baseline and stable Postop Assessment: no apparent nausea or vomiting Anesthetic complications: no    Last Vitals:  Vitals:   12/27/18 1655 12/27/18 1700  BP: 123/66   Pulse: 99   Resp:  (!) 22  Temp:    SpO2: 94%     Last Pain:  Vitals:   12/27/18 1655  TempSrc:   PainSc: 9                  Tiajuana Amass

## 2018-12-27 NOTE — Anesthesia Procedure Notes (Signed)
Procedure Name: Intubation Date/Time: 12/27/2018 7:40 AM Performed by: Moshe Salisbury, CRNA Pre-anesthesia Checklist: Patient identified, Emergency Drugs available, Suction available and Patient being monitored Patient Re-evaluated:Patient Re-evaluated prior to induction Oxygen Delivery Method: Circle System Utilized Preoxygenation: Pre-oxygenation with 100% oxygen Induction Type: IV induction Ventilation: Mask ventilation with difficulty Laryngoscope Size: Glidescope and 4 Tube type: Oral Tube size: 8.0 mm Number of attempts: 1 Airway Equipment and Method: Stylet Placement Confirmation: ETT inserted through vocal cords under direct vision,  positive ETCO2 and breath sounds checked- equal and bilateral Secured at: 24 cm Tube secured with: Tape Dental Injury: Teeth and Oropharynx as per pre-operative assessment

## 2018-12-27 NOTE — Op Note (Signed)
Patient name: Dale [name] MRN: [id number] DOB: 06-02-1965 Sex: male  12/27/2018 Pre-operative Diagnosis: ESRD Post-operative diagnosis:  Same Surgeon:  Annamarie Major Assistants: Aldona Bar Ryne  Procedure:   Left second stage basilic vein fistula creation Anesthesia: General Blood Loss: 200 cc Specimens: None  Findings: Dense scar tissue throughout the entire upper arm with severe venous hypertension.  The patient also had a contracted shoulder and so visualization was extremely difficult.  I had to make one long incision in order to adequately expose the basilic vein.  The vein was of very good caliber measuring 6-7 mm.  After the initial transposition I was disappointed with the flow through the fistula and so I took down the anastomosis and then dissected out the brachial artery more proximally and we did the anastomosis to the brachial artery.  I had to make 2 counterincisions on the anterior surface of the arm in order to control bleeding.  Indications: The patient has previously undergone for stage basilic vein fistula creation.  He comes in today for second stage procedure  Procedure:  The patient was identified in the holding area and taken to [place] [address]  The patient was then placed supine on the table. general anesthesia was administered.  The patient was prepped and draped in the usual sterile fashion.  A time out was called and antibiotics were administered.  Ultrasound was used to evaluate the basilic vein in the upper arm.  It was of adequate caliber measuring 6-7 mm.  I began by making an incision on the medial side of the arm at the antecubital crease.  Immediately, the patient had bleeding from collateral vessels and venous hypertension which made exposure very difficult, along with the fact that he could not externally rotate his arm and his shoulder was very restricted.  Also, the tissue was very woody and dissection was very tedious.  Instead of making skip incisions I ended  up making 1 long incision to adequately expose the vein.  The nerve was protected from the vein throughout.  All side branches were ligated between silk ties and metal clips.  Once I had the vein fully isolated from the axilla to the antecubital crease I marked it for orientation.  I then used a curved tunneler to create a tunnel.  There was significant bleeding from the tunnel after it was created.  I then occluded the fistula at the antecubital crease and in the axilla and divided near the antecubital crease.  The vein was then brought through the previously created tunnel making sure to maintain proper orientation.  I then performed a end-to-end anastomosis with running 5-0 Prolene.  Once this was completed the clamps were released.  There was poor flow through the fistula.  I then decided to take the tie off 1 of the side branches and pass a Fogarty catheter across the anastomosis.  Unfortunately I was unable to advance the catheter across the arterial anastomosis.  Because of this, I felt the best option was to redo the anastomosis to the brachial artery.  Through the existing incision, I dissected out the brachial artery.  This was heavily calcified.  Once I had adequately exposed, I gave the patient heparin.  I then occluded the fistula again with baby Gregory clamps and divided near the antecubital crease.  I ligated the vein near the artery with a 2-0 silk tie.  I then spatulated the vein and opened the artery with a #11 blade.  This was  extended longitudinally with Potts scissors.  A running anastomosis was then created with 6-0 Prolene.  Prior to completion the appropriate flushing maneuvers were performed and the anastomosis was completed.  The clamps were released.  There was now an excellent thrill within the fistula.  I then gave protamine to reverse the heparin.  There continued to be significant bleeding from the tunnel.  I was unable to control the bleeding from the existing incision and therefore  I made 2 counterincisions around the tunnel and identified several large collaterals which were ligated.  After this there appeared to be good hemostasis.  I irrigated the wound.  Once I was satisfied with hemostasis, I reapproximated the deep tissue with a running 2-0 Vicryl.  I elected to close the incision with staples.  Sterile dressings and an Ace wrap were applied.  The patient was successfully extubated and taken to recovery in stable condition.   Disposition: To PACU stable   V. Annamarie Major, M.D., [facility] [place] Office: [phone] Pager:  [phone]

## 2018-12-27 NOTE — Progress Notes (Signed)
Pt arrived from [place]. CHG bath given. Vitals stable. Pt connected to wall monitor for cardiac monitoring. CCMD notified. Pt has all belongings. Pt oriented to room/callbell within reach. Will continue to monitor.  Jerald Kief, RN

## 2018-12-27 NOTE — Interval H&P Note (Signed)
History and Physical Interval Note:  12/27/2018 7:25 AM  Dale [name]  has presented today for surgery, with the diagnosis of END STAGE RENAL DISEASE FOR HEMODIALYSIS ACCESS.  The various methods of treatment have been discussed with the patient and family. After consideration of risks, benefits and other options for treatment, the patient has consented to  Procedure(s): SECOND STAGE BASILIC VEIN TRANSPOSITION VERSUS INSERTION OF ARTERIOVENOUS GRAFT LEFT ARM (Left) as a surgical intervention.  [name] history has been reviewed, patient examined, no change in status, stable for surgery.  I have reviewed the patient's chart and labs.  Questions were answered to the patient's satisfaction.     Annamarie Major

## 2018-12-27 NOTE — Transfer of Care (Signed)
Immediate Anesthesia Transfer of Care Note  Patient: Dale [name]  Procedure(s) Performed: SECOND STAGE BASILIC VEIN TRANSPOSITION  LEFT ARM (Left Arm Upper)  Patient Location: PACU  Anesthesia Type:General  Level of Consciousness: drowsy and patient cooperative  Airway & Oxygen Therapy: Patient Spontanous Breathing and Patient connected to nasal cannula oxygen  Post-op Assessment: Report given to RN, Post -op Vital signs reviewed and stable and Patient moving all extremities  Post vital signs: Reviewed and stable  Last Vitals:  Vitals Value Taken Time  BP 137/64 12/27/18 1039  Temp    Pulse 103 12/27/18 1039  Resp 22 12/27/18 1039  SpO2 100 % 12/27/18 1039  Vitals shown include unvalidated device data.  Last Pain:  Vitals:   12/27/18 0628  PainSc: 3          Complications: No apparent anesthesia complications

## 2018-12-28 ENCOUNTER — Encounter (HOSPITAL_COMMUNITY): Payer: Self-pay | Admitting: Surgery

## 2018-12-28 DIAGNOSIS — M797 Fibromyalgia: Secondary | ICD-10-CM | POA: Diagnosis not present

## 2018-12-28 DIAGNOSIS — K3184 Gastroparesis: Secondary | ICD-10-CM | POA: Diagnosis not present

## 2018-12-28 DIAGNOSIS — M1990 Unspecified osteoarthritis, unspecified site: Secondary | ICD-10-CM | POA: Diagnosis not present

## 2018-12-28 DIAGNOSIS — Z955 Presence of coronary angioplasty implant and graft: Secondary | ICD-10-CM | POA: Diagnosis not present

## 2018-12-28 DIAGNOSIS — I132 Hypertensive heart and chronic kidney disease with heart failure and with stage 5 chronic kidney disease, or end stage renal disease: Secondary | ICD-10-CM | POA: Diagnosis not present

## 2018-12-28 DIAGNOSIS — I509 Heart failure, unspecified: Secondary | ICD-10-CM | POA: Diagnosis not present

## 2018-12-28 DIAGNOSIS — G4730 Sleep apnea, unspecified: Secondary | ICD-10-CM | POA: Diagnosis not present

## 2018-12-28 DIAGNOSIS — I2510 Atherosclerotic heart disease of native coronary artery without angina pectoris: Secondary | ICD-10-CM | POA: Diagnosis not present

## 2018-12-28 DIAGNOSIS — Z79890 Hormone replacement therapy (postmenopausal): Secondary | ICD-10-CM | POA: Diagnosis not present

## 2018-12-28 DIAGNOSIS — K219 Gastro-esophageal reflux disease without esophagitis: Secondary | ICD-10-CM | POA: Diagnosis not present

## 2018-12-28 DIAGNOSIS — Z7951 Long term (current) use of inhaled steroids: Secondary | ICD-10-CM | POA: Diagnosis not present

## 2018-12-28 DIAGNOSIS — Z794 Long term (current) use of insulin: Secondary | ICD-10-CM | POA: Diagnosis not present

## 2018-12-28 DIAGNOSIS — N186 End stage renal disease: Secondary | ICD-10-CM | POA: Diagnosis not present

## 2018-12-28 DIAGNOSIS — E039 Hypothyroidism, unspecified: Secondary | ICD-10-CM | POA: Diagnosis not present

## 2018-12-28 DIAGNOSIS — I739 Peripheral vascular disease, unspecified: Secondary | ICD-10-CM | POA: Diagnosis not present

## 2018-12-28 LAB — BASIC METABOLIC PANEL
Anion gap: 13 (ref 5–15)
BUN: 47 mg/dL — ABNORMAL HIGH (ref 6–20)
CO2: 25 mmol/L (ref 22–32)
Calcium: 9.2 mg/dL (ref 8.9–10.3)
Chloride: 99 mmol/L (ref 98–111)
Creatinine, Ser: 9.71 mg/dL — ABNORMAL HIGH (ref 0.61–1.24)
GFR calc Af Amer: 6 mL/min — ABNORMAL LOW (ref >60)
GFR calc non Af Amer: 5 mL/min — ABNORMAL LOW (ref >60)
Glucose, Bld: 144 mg/dL — ABNORMAL HIGH (ref 70–99)
Potassium: 4.7 mmol/L (ref 3.5–5.1)
Sodium: 137 mmol/L (ref 135–145)

## 2018-12-28 LAB — CBC
HCT: 34.3 % — ABNORMAL LOW (ref 39.0–52.0)
Hemoglobin: 10.8 g/dL — ABNORMAL LOW (ref 13.0–17.0)
MCH: 33.6 pg (ref 26.0–34.0)
MCHC: 31.5 g/dL (ref 30.0–36.0)
MCV: 106.9 fL — ABNORMAL HIGH (ref 80.0–100.0)
Platelets: 226 10*3/uL (ref 150–400)
RBC: 3.21 MIL/uL — ABNORMAL LOW (ref 4.22–5.81)
RDW: 14.5 % (ref 11.5–15.5)
WBC: 10.1 10*3/uL (ref 4.0–10.5)
nRBC: 0 % (ref 0.0–0.2)

## 2018-12-28 LAB — GLUCOSE, CAPILLARY
Glucose-Capillary: 125 mg/dL — ABNORMAL HIGH (ref 70–99)
Glucose-Capillary: 138 mg/dL — ABNORMAL HIGH (ref 70–99)
Glucose-Capillary: 154 mg/dL — ABNORMAL HIGH (ref 70–99)

## 2018-12-28 NOTE — Progress Notes (Addendum)
  Postoperative hemodialysis access     Date of Surgery:  12/27/2018 Surgeon: Trula Slade  Subjective:  Says they had to put another ace wrap on bc it was oozing.    PHYSICAL EXAMINATION:  Vitals:   12/27/18 1700 12/27/18 2025  BP:  (!) 149/58  Pulse:    Resp: (!) 22   Temp:  98.1 F (36.7 C)  SpO2:  96%    Incision is clean; the incision over the tunneled area still has a small amount of bloody ooze.  Sensation in digits is intact;  There is  Thrill  The graft/fistula is palpable    ASSESSMENT/PLAN:  Dale [name] is a 53 y.o. year old male who is s/p 2nd stage BVT.  -graft/fistula is patent -pt does have some numbness in his hand-he does have a left radial artery signal and motor and sensation is in tact.  Numbness may be due to ace wrap. Pt's arm rewrapped.   -Dr. Trula Slade to see pt later this morning. -pt was supposed to dialyze this am at 0500 in [place].  Called Dr. Melvia Heaps to determine his HD plan.  -f/u with Dr. Trula Slade in a couple of weeks    Leontine Locket, PA-C [facility] [phone]

## 2018-12-28 NOTE — Discharge Summary (Signed)
Discharge Summary    Dale [name] 05-Jun-1965 53 y.o. male  [id number]  Admission Date: 12/27/2018  Discharge Date: 12/28/2018  Physician: Serafina Mitchell, MD  Admission Diagnosis: END STAGE RENAL DISEASE FOR HEMODIALYSIS ACCESS   HPI:   This is a 53 y.o. male who is status post first stage left BVT on 11-01-2018. His right central venous system is occluded based on venography  On 10-16-2018   Hospital Course:  The patient was admitted to the hospital and taken to the operating room on 12/27/2018 and underwent: Left 2nd stage BVT    Findings: Dense scar tissue throughout the entire upper arm with severe venous hypertension.  The patient also had a contracted shoulder and so visualization was extremely difficult.  I had to make one long incision in order to adequately expose the basilic vein.  The vein was of very good caliber measuring 6-7 mm.  After the initial transposition I was disappointed with the flow through the fistula and so I took down the anastomosis and then dissected out the brachial artery more proximally and we did the anastomosis to the brachial artery.  I had to make 2 counterincisions on the anterior surface of the arm in order to control bleeding.  The pt tolerated the procedure well and was transported to the PACU in good condition.   On POD 1: -graft/fistula is patent -pt does have some numbness in his hand-he does have a left radial artery signal and motor and sensation is in tact.  Numbness may be due to ace wrap. Pt's arm rewrapped.   -Dr. Trula Slade to see pt later this morning. -pt was supposed to dialyze this am at 0500 in [place].  Called Dr. Melvia Heaps to determine his HD plan.  -f/u with Dr. Trula Slade in a couple of weeks  Pt's outpatient dialysis was arranged.  He was discharged with instructions to leave ace wrap on for 48 hours  The remainder of the hospital course consisted of increasing mobilization and increasing intake of solids without difficulty.  CBC     Component Value Date/Time   WBC 10.1 12/28/2018 0249   RBC 3.21 (L) 12/28/2018 0249   HGB 10.8 (L) 12/28/2018 0249   HGB 12.6 (L) 11/29/2016 1454   HCT 34.3 (L) 12/28/2018 0249   HCT 30.7 (L) 07/13/2017 0455   PLT 226 12/28/2018 0249   PLT 278 11/29/2016 1454   MCV 106.9 (H) 12/28/2018 0249   MCV 108 (H) 11/29/2016 1454   MCH 33.6 12/28/2018 0249   MCHC 31.5 12/28/2018 0249   RDW 14.5 12/28/2018 0249   RDW 15.2 11/29/2016 1454   LYMPHSABS 1.5 08/01/2018 2203   LYMPHSABS 1.6 11/29/2016 1454   MONOABS 0.6 08/01/2018 2203   EOSABS 0.3 08/01/2018 2203   EOSABS 0.3 11/29/2016 1454   BASOSABS 0.1 08/01/2018 2203   BASOSABS 0.0 11/29/2016 1454    BMET    Component Value Date/Time   NA 137 12/28/2018 0249   NA 140 11/29/2016 1454   K 4.7 12/28/2018 0249   CL 99 12/28/2018 0249   CO2 25 12/28/2018 0249   GLUCOSE 144 (H) 12/28/2018 0249   BUN 47 (H) 12/28/2018 0249   BUN 38 (H) 11/29/2016 1454   CREATININE 9.71 (H) 12/28/2018 0249   CALCIUM 9.2 12/28/2018 0249   GFRNONAA 5 (L) 12/28/2018 0249   GFRAA 6 (L) 12/28/2018 0249        Discharge Diagnosis:  END STAGE RENAL DISEASE FOR HEMODIALYSIS ACCESS  Secondary Diagnosis: Patient  Active Problem List   Diagnosis Date Noted  . Lymph node enlargement 08/13/2018  . Clotted renal dialysis arteriovenous graft ([name]) 08/02/2018  . Pressure injury of skin 08/02/2018  . Upper airway cough syndrome 08/11/2017  . Morbid obesity due to excess calories ([place]) 08/11/2017  . Chronic respiratory failure with hypoxia ([place]) 08/10/2017  . GERD (gastroesophageal reflux disease) 07/29/2017  . Rash and nonspecific skin eruption 07/27/2017  . Contact dermatitis 07/27/2017  . ESRD on hemodialysis ([place])   . Sepsis ([place]) 07/12/2017  . T1DM (type 1 diabetes mellitus) ([place]) 07/12/2017  . Hypotension (arterial) 10/15/2016  . Heme positive stool   . IDDM with complications 46/65/9935  . Oxygen dependent 03/24/2016  . Visit for preventive  health examination 03/24/2016  . Chronic diastolic heart failure ([place]) 02/25/2016  . Tracheobronchomalacia 02/04/2016  . Anemia of chronic disease   . Iron deficiency anemia   . Gastrointestinal hemorrhage with melena 12/21/2015  . Pulmonary edema 03/09/2015  . Chronic pain 03/09/2015  . Hyperkalemia 03/09/2015  . HLD (hyperlipidemia) 11/27/2014  . ESRD on dialysis ([place]) 09/30/2014  . Coronary artery disease involving native coronary artery of native heart with unstable angina pectoris ([place]) 03/20/2014  . Allergic reaction to dye 03/20/2014  . NSTEMI (non-ST elevated myocardial infarction) ([place]) 03/19/2014  . HTN (hypertension) 02/18/2014  . ESRD (end stage renal disease) ([place]) 08/27/2011  . Abdominal pain 08/27/2011  . Pericardial effusion 08/27/2011  . History of renal transplant 07/22/2011  . S/P BKA (below knee amputation) ([place]) 07/22/2011  . OSA (obstructive sleep apnea) 07/22/2011  . DOE (dyspnea on exertion) 07/22/2011   Past Medical History:  Diagnosis Date  . Anal fissure   . Anemia of chronic disease   . Arthritis    "hands, right knee" (03/19/2014) no cartlidge in right knee  . CAD (coronary artery disease)    a. Abnl nuc ->LHC (11/15):  mid to dist Dx 80%, mid RCA 99% (functional CTO), dist RCA 80% >> PCI: balloon angioplasty to mid RCA (could not deliver stent) - staged PCI Rotoblator atherectomy/DES to [facility]. b. NSTEMI 12/2015 s/p DES to diagonal.  . Cholecystitis    a. 08/27/2011  . Chronic diastolic CHF (congestive heart failure) ([place])    a. Echo 3/13:  EF 55-60%;  b. Echo (11/15):  Mild LVH, EF 60-65%, no RWMA, Gr 1 DD, MAC, mild LAE, normal RVF, mild RAE;  c.  Echo 5/16:  severe LVH, EF 55-60%, no RWMA, Gr 1 DD, MAC, mild LAE  . Claustrophobia    when things get around his face.   . Complication of anesthesia   . Difficult intubation    only once in 2015 at [facility] haven't had a problem since then  . Diverticulosis   . Esophageal dysmotility   . ESRD (end  stage renal disease) ([place])    a. 1995 s/p cadaveric transplant w/ susbequent failure after 18 yrs;  b.Dialysis initiated 07/2011 [facility] M-W-F  . Fibromyalgia   . Gastroparesis   . GERD (gastroesophageal reflux disease)   . Gout    PMH  . History of blood transfusion   . HLD (hyperlipidemia)   . Hypertension   . Hypothyroidism   . Inguinal lymphadenopathy    a. bilateral - s/p biopsy 07/2011  . Insulin dependent diabetes mellitus (HCC)    Type I  . Monilial esophagitis ([place])   . Morbid obesity ([place])   . OSA (obstructive sleep apnea)    adjustable bed  . PAD (peripheral artery  disease) ([place])    a. s/p L BKA.  [name] Pericardial effusion    a.  Small by CT 08/22/11;  b.  Large by CT 08/27/11. c. Not seen on 11/2015 echo.  . Pneumonia ~ 2007  . PONV (postoperative nausea and vomiting)    vomited once after a procedure.  . T1DM (type 1 diabetes mellitus) ([place])   . Tracheobronchomalacia      Allergies as of 12/28/2018      Reactions   Iodinated Diagnostic Agents Itching, Rash, Other (See Comments)   Systemic itching and transient rash appearance within hours of receiving contrast    Prednisone Other (See Comments)   Increases sugar levels   Adhesive [tape] Itching, Other (See Comments)   Blisters on arm from adhesive tape at dialysis   Amoxicillin Itching, Rash, Other (See Comments)   Did it involve swelling of the face/tongue/throat, SOB, or low BP? No Did it involve sudden or severe rash/hives, skin peeling, or any reaction on the inside of your mouth or nose? No Did you need to seek medical attention at a hospital or doctor's office? No When did it last happen?10+ years If all above answers are "NO", may proceed with cephalosporin use.   Clindamycin/lincomycin Hives, Itching   Enalapril Rash, Cough   Cough   Mobic [meloxicam] Hives, Rash   Morphine Hives, Nausea Only, Rash   Brilinta [ticagrelor] Nausea And Vomiting   Ciprofloxacin Itching, Rash   Clopidogrel Rash    Codeine Hives, Itching, Rash   Doxycycline Rash   Hydrocodone Rash   Levofloxacin Hives, Itching   Oxycodone Rash   Rocephin [ceftriaxone] Itching      Medication List    TAKE these medications   acetaminophen 500 MG tablet Commonly known as: TYLENOL Take 1,000 mg by mouth See admin instructions. Take 1000 mg at bedtime, may take an additional 1000 mg dose as needed for pain   allopurinol 100 MG tablet Commonly known as: ZYLOPRIM Take 2 tablets (200 mg total) by mouth daily.   aspirin EC 81 MG tablet Take 81 mg by mouth at bedtime.   atorvastatin 10 MG tablet Commonly known as: LIPITOR Take 10 mg by mouth daily.   calcium acetate 667 MG capsule Commonly known as: PHOSLO Take 667-1,334 mg by mouth See admin instructions. Take 1334 mg with each meal and 667 mg with each snack   cephALEXin 500 MG capsule Commonly known as: KEFLEX Take 1 capsule (500 mg total) by mouth 2 (two) times daily.   cetirizine 10 MG tablet Commonly known as: ZYRTEC Take 10 mg by mouth daily.   Dialyvite 800 0.8 MG Tabs Take 1 tablet by mouth daily.   diphenhydrAMINE 25 MG tablet Commonly known as: BENADRYL Take 50 mg by mouth at bedtime.   docusate sodium 100 MG capsule Commonly known as: COLACE Take 200 mg by mouth 2 (two) times daily.   famotidine 20 MG tablet Commonly known as: PEPCID Take 20 mg by mouth daily as needed for heartburn or indigestion.   feeding supplement (NEPRO CARB STEADY) Liqd Take 237 mLs by mouth daily. What changed: when to take this   fluticasone 50 MCG/ACT nasal spray Commonly known as: FLONASE Place 2 sprays into both nostrils daily as needed for allergies.   gabapentin 300 MG capsule Commonly known as: NEURONTIN Take 300 mg by mouth at bedtime.   Glucagon Emergency 1 MG injection Generic drug: glucagon Inject 1 mg into the muscle daily as needed (low blood sugar).  hydrocortisone 2.5 % cream Apply 1 application topically 2 (two) times daily as  needed (rash / itching).   HYDROmorphone 2 MG tablet Commonly known as: DILAUDID Take 2 mg by mouth 2 (two) times daily as needed for moderate pain or severe pain.   hydrOXYzine 10 MG tablet Commonly known as: ATARAX/VISTARIL Take 10 mg by mouth 3 (three) times daily as needed for itching.   insulin lispro 100 UNIT/ML injection Commonly known as: HUMALOG Inject 75-120 Units into the skin continuous. Per insulin pump   levothyroxine 112 MCG tablet Commonly known as: SYNTHROID Take 112 mcg by mouth at bedtime.   loperamide 2 MG tablet Commonly known as: IMODIUM A-D Take 2-4 mg by mouth 4 (four) times daily as needed for diarrhea or loose stools.   Melatonin 5 MG Tabs Take 5 mg by mouth at bedtime.   metoCLOPramide 10 MG tablet Commonly known as: REGLAN Take 10 mg by mouth 4 (four) times daily -  before meals and at bedtime.   midodrine 10 MG tablet Commonly known as: PROAMATINE Take 1 tablet (10 mg total) by mouth 3 (three) times daily. Takes 1 tab in mornings of dialysis and takes 2 tabs 30 mins before starting dialysis (m,w,f) What changed:   when to take this  additional instructions   nitroGLYCERIN 0.4 MG SL tablet Commonly known as: NITROSTAT Place 1 tablet (0.4 mg total) under the tongue every 5 (five) minutes as needed for chest pain.   nystatin powder Generic drug: nystatin Apply 1 g topically daily as needed (yeast).   polyethylene glycol 17 g packet Commonly known as: MIRALAX / GLYCOLAX Take 17 g by mouth daily. What changed:   when to take this  reasons to take this   prasugrel 10 MG Tabs tablet Commonly known as: EFFIENT TAKE 1 TABLET BY MOUTH EVERY DAY   promethazine 25 MG tablet Commonly known as: PHENERGAN Take 25 mg by mouth every 6 (six) hours as needed for nausea or vomiting.   ReliOn Glucose Shot Liqd Take 1 Dose by mouth daily as needed (low blood sugar).   rOPINIRole 2 MG tablet Commonly known as: REQUIP Take 1 tablet (2 mg total)  by mouth at bedtime. Take 2 mg by mouth every night at bedtime What changed:   when to take this  additional instructions   Systane 0.4-0.3 % Soln Generic drug: Polyethyl Glycol-Propyl Glycol Place 1 drop into both eyes 4 (four) times daily.   SYSTANE ULTRA PF OP Place 1 drop into both eyes every 2 (two) hours as needed (dry eyes).   testosterone cypionate 200 MG/ML injection Commonly known as: DEPOTESTOSTERONE CYPIONATE Inject 200 mg into the muscle every 14 (fourteen) days.   Velphoro 500 MG chewable tablet Generic drug: sucroferric oxyhydroxide Chew 500-1,000 mg by mouth See admin instructions. Chew 1,000 mg by mouth three times a day with meals and 500 mg twice daily with snacks        Instructions: [facility] Discharge Instructions AV Fistula or Graft Surgery for Dialysis Access  Please refer to the following instructions for your post-procedure care. Your surgeon or physician assistant will discuss any changes with you.  Activity  You may drive the day following your surgery, if you are comfortable and no longer taking prescription pain medication. Resume full activity as the soreness in your incision resolves.  Bathing/Showering  You may shower after you go home. Keep your incision dry for 48 hours. Do not soak in a bathtub,  hot tub, or swim until the incision heals completely. You may not shower if you have a hemodialysis catheter.  Incision Care  Clean your incision with mild soap and water after 48 hours. Pat the area dry with a clean towel. You do not need a bandage unless otherwise instructed. Do not apply any ointments or creams to your incision. You may have skin glue on your incision. Do not peel it off. It will come off on its own in about one week. Your arm may swell a bit after surgery. To reduce swelling use pillows to elevate your arm so it is above your heart. Your doctor will tell you if you need to lightly wrap your arm  with an ACE bandage.  Diet  Resume your normal diet. There are not special food restrictions following this procedure. In order to heal from your surgery, it is CRITICAL to get adequate nutrition. Your body requires vitamins, minerals, and protein. Vegetables are the best source of vitamins and minerals. Vegetables also provide the perfect balance of protein. Processed food has little nutritional value, so try to avoid this.  Medications  Resume taking all of your medications. If your incision is causing pain, you may take over-the counter pain relievers such as acetaminophen (Tylenol). If you were prescribed a stronger pain medication, please be aware these medications can cause nausea and constipation. Prevent nausea by taking the medication with a snack or meal. Avoid constipation by drinking plenty of fluids and eating foods with high amount of fiber, such as fruits, vegetables, and grains. Do not take Tylenol if you are taking prescription pain medications.  Follow up Your surgeon may want to see you in the office following your access surgery. If so, this will be arranged at the time of your surgery.  Please call us immediately for any of the following conditions:  . Increased pain, redness, drainage (pus) from your incision site . Fever of 101 degrees or higher . Severe or worsening pain at your incision site . Hand pain or numbness. .  Reduce your risk of vascular disease:  . Stop smoking. If you would like help, call [facility] at 1-800-QUIT-NOW 435-515-0997) or [place] at ([phone])338-5975  . Manage your cholesterol . Maintain a desired weight . Control your diabetes . Keep your blood pressure down  Dialysis  It will take several weeks to several months for your new dialysis access to be ready for use. Your surgeon will determine when it is OK to use it. Your nephrologist will continue to direct your dialysis. You can continue to use your Permcath until your new access is  ready for use.   12/28/2018 Dale [name] [id number] May 14, 1965  Surgeon(s): Serafina Mitchell, MD  Procedure(s): SECOND STAGE BASILIC VEIN TRANSPOSITION  LEFT ARM  x Do not stick fistula for 6 weeks    If you have any questions, please call the office at [phone].  Prescriptions given: None given  Disposition: home  Patient's condition: is Good  Follow up: 1. Dr. Trula Slade in 2-3 weeks   Leontine Locket, PA-C [facility] [phone] 12/28/2018  7:59 AM

## 2018-12-28 NOTE — Progress Notes (Signed)
Results for BENNETT, GRILLEY (MRN [id number]) as of 12/28/2018 09:28  Ref. Range 12/27/2018 14:44 12/27/2018 20:30 12/28/2018 00:01 12/28/2018 04:59 12/28/2018 06:42  Glucose-Capillary Latest Ref Range: 70 - 99 mg/dL 174 (H) 202 (H) 125 (H) 138 (H) 154 (H)  Noted that patient is eating now.  Recommend changing CBG times to [facility], HS, and 0200 for patients with insulin pump. CBGs good. Noted that patient started pump back yesterday following surgical procedure.   Per chart review: Last insulin pump settings per diabetes coordinator on 08/02/18: 12 MN - 8 am 1.1unit/hr 8 am - 4 pm  1.6 unit/hr. 4 pm - 12 MN   1.35 unit/hr. Total: 32.4 units  Will continue to monitor blood sugars while in the hospital.  Harvel Ricks RN BSN CDE Diabetes Coordinator Pager: [phone]  8am-5pm

## 2018-12-28 NOTE — Progress Notes (Signed)
Patient has been rescheduled at OP HD clinic/[place] for treatment today, 12/28/18 at 12:00pm. His wife is on the way from her job in [place], [place] to get him now, and should arrive within and hour to take him to his clinic. Please prepare his discharge so he can leave the hospital as soon as she arrives.  Dale [name], [place] Renal Navigator [phone]

## 2018-12-28 NOTE — Discharge Instructions (Signed)
[facility]  Discharge Instructions  AV Fistula or Graft Surgery for Dialysis Access  Please refer to the following instructions for your post-procedure care. Your surgeon or physician assistant will discuss any changes with you.  Activity  You may drive the day following your surgery, if you are comfortable and no longer taking prescription pain medication. Resume full activity as the soreness in your incision resolves.  Bathing/Showering  You may shower after you go home. Keep your incision dry for 48 hours. Do not soak in a bathtub, hot tub, or swim until the incision heals completely. You may not shower if you have a hemodialysis catheter.  Incision Care  Clean your incision with mild soap and water after 48 hours. Pat the area dry with a clean towel. You do not need a bandage unless otherwise instructed. Do not apply any ointments or creams to your incision. You may have skin glue on your incision. Do not peel it off. It will come off on its own in about one week. Your arm may swell a bit after surgery. To reduce swelling use pillows to elevate your arm so it is above your heart. Your doctor will tell you if you need to lightly wrap your arm with an ACE bandage. Leave ace bandage in place for 48 hours then you may remove.    Diet  Resume your normal diet. There are not special food restrictions following this procedure. In order to heal from your surgery, it is CRITICAL to get adequate nutrition. Your body requires vitamins, minerals, and protein. Vegetables are the best source of vitamins and minerals. Vegetables also provide the perfect balance of protein. Processed food has little nutritional value, so try to avoid this.  Medications  Resume taking all of your medications. If your incision is causing pain, you may take over-the counter pain relievers such as acetaminophen (Tylenol). If you were prescribed a stronger pain medication, please be  aware these medications can cause nausea and constipation. Prevent nausea by taking the medication with a snack or meal. Avoid constipation by drinking plenty of fluids and eating foods with high amount of fiber, such as fruits, vegetables, and grains.  Do not take Tylenol if you are taking prescription pain medications.  Follow up Your surgeon may want to see you in the office following your access surgery. If so, this will be arranged at the time of your surgery.  Please call us immediately for any of the following conditions:  Increased pain, redness, drainage (pus) from your incision site Fever of 101 degrees or higher Severe or worsening pain at your incision site Hand pain or numbness.  Reduce your risk of vascular disease:  Stop smoking. If you would like help, call [facility] at 1-800-QUIT-NOW 607-181-7144) or [place] at [place] your cholesterol Maintain a desired weight Control your diabetes Keep your blood pressure down  Dialysis  It will take several weeks to several months for your new dialysis access to be ready for use. Your surgeon will determine when it is okay to use it. Your nephrologist will continue to direct your dialysis. You can continue to use your Permcath until your new access is ready for use.   12/27/2018 Dale [name] [id number] 1965-04-27  Surgeon(s): Serafina Mitchell, MD  Procedure(s): SECOND STAGE BASILIC VEIN TRANSPOSITION  LEFT ARM  x Do not stick fistula for 6 weeks    If you have any questions, please call the office at  [phone]. ° ° °

## 2018-12-29 DIAGNOSIS — L299 Pruritus, unspecified: Secondary | ICD-10-CM | POA: Diagnosis not present

## 2018-12-29 DIAGNOSIS — E1022 Type 1 diabetes mellitus with diabetic chronic kidney disease: Secondary | ICD-10-CM | POA: Diagnosis not present

## 2018-12-29 DIAGNOSIS — Z23 Encounter for immunization: Secondary | ICD-10-CM | POA: Diagnosis not present

## 2018-12-29 DIAGNOSIS — D631 Anemia in chronic kidney disease: Secondary | ICD-10-CM | POA: Diagnosis not present

## 2018-12-29 DIAGNOSIS — N186 End stage renal disease: Secondary | ICD-10-CM | POA: Diagnosis not present

## 2018-12-29 DIAGNOSIS — N2581 Secondary hyperparathyroidism of renal origin: Secondary | ICD-10-CM | POA: Diagnosis not present

## 2018-12-29 DIAGNOSIS — Z992 Dependence on renal dialysis: Secondary | ICD-10-CM | POA: Diagnosis not present

## 2018-12-29 DIAGNOSIS — T82818A Embolism of vascular prosthetic devices, implants and grafts, initial encounter: Secondary | ICD-10-CM | POA: Diagnosis not present

## 2018-12-29 DIAGNOSIS — D689 Coagulation defect, unspecified: Secondary | ICD-10-CM | POA: Diagnosis not present

## 2018-12-31 DIAGNOSIS — L299 Pruritus, unspecified: Secondary | ICD-10-CM | POA: Diagnosis not present

## 2018-12-31 DIAGNOSIS — E1022 Type 1 diabetes mellitus with diabetic chronic kidney disease: Secondary | ICD-10-CM | POA: Diagnosis not present

## 2018-12-31 DIAGNOSIS — Z992 Dependence on renal dialysis: Secondary | ICD-10-CM | POA: Diagnosis not present

## 2018-12-31 DIAGNOSIS — T82818A Embolism of vascular prosthetic devices, implants and grafts, initial encounter: Secondary | ICD-10-CM | POA: Diagnosis not present

## 2018-12-31 DIAGNOSIS — Z23 Encounter for immunization: Secondary | ICD-10-CM | POA: Diagnosis not present

## 2018-12-31 DIAGNOSIS — N2581 Secondary hyperparathyroidism of renal origin: Secondary | ICD-10-CM | POA: Diagnosis not present

## 2018-12-31 DIAGNOSIS — D631 Anemia in chronic kidney disease: Secondary | ICD-10-CM | POA: Diagnosis not present

## 2018-12-31 DIAGNOSIS — N186 End stage renal disease: Secondary | ICD-10-CM | POA: Diagnosis not present

## 2018-12-31 DIAGNOSIS — D689 Coagulation defect, unspecified: Secondary | ICD-10-CM | POA: Diagnosis not present

## 2019-01-02 DIAGNOSIS — N186 End stage renal disease: Secondary | ICD-10-CM | POA: Diagnosis not present

## 2019-01-02 DIAGNOSIS — Z23 Encounter for immunization: Secondary | ICD-10-CM | POA: Diagnosis not present

## 2019-01-02 DIAGNOSIS — L299 Pruritus, unspecified: Secondary | ICD-10-CM | POA: Diagnosis not present

## 2019-01-02 DIAGNOSIS — T82818A Embolism of vascular prosthetic devices, implants and grafts, initial encounter: Secondary | ICD-10-CM | POA: Diagnosis not present

## 2019-01-02 DIAGNOSIS — N2581 Secondary hyperparathyroidism of renal origin: Secondary | ICD-10-CM | POA: Diagnosis not present

## 2019-01-02 DIAGNOSIS — D631 Anemia in chronic kidney disease: Secondary | ICD-10-CM | POA: Diagnosis not present

## 2019-01-02 DIAGNOSIS — E1022 Type 1 diabetes mellitus with diabetic chronic kidney disease: Secondary | ICD-10-CM | POA: Diagnosis not present

## 2019-01-02 DIAGNOSIS — Z992 Dependence on renal dialysis: Secondary | ICD-10-CM | POA: Diagnosis not present

## 2019-01-02 DIAGNOSIS — D689 Coagulation defect, unspecified: Secondary | ICD-10-CM | POA: Diagnosis not present

## 2019-01-03 DIAGNOSIS — E1022 Type 1 diabetes mellitus with diabetic chronic kidney disease: Secondary | ICD-10-CM | POA: Diagnosis not present

## 2019-01-04 DIAGNOSIS — D689 Coagulation defect, unspecified: Secondary | ICD-10-CM | POA: Diagnosis not present

## 2019-01-04 DIAGNOSIS — L299 Pruritus, unspecified: Secondary | ICD-10-CM | POA: Diagnosis not present

## 2019-01-04 DIAGNOSIS — D631 Anemia in chronic kidney disease: Secondary | ICD-10-CM | POA: Diagnosis not present

## 2019-01-04 DIAGNOSIS — N186 End stage renal disease: Secondary | ICD-10-CM | POA: Diagnosis not present

## 2019-01-04 DIAGNOSIS — N2581 Secondary hyperparathyroidism of renal origin: Secondary | ICD-10-CM | POA: Diagnosis not present

## 2019-01-04 DIAGNOSIS — Z23 Encounter for immunization: Secondary | ICD-10-CM | POA: Diagnosis not present

## 2019-01-04 DIAGNOSIS — Z992 Dependence on renal dialysis: Secondary | ICD-10-CM | POA: Diagnosis not present

## 2019-01-04 DIAGNOSIS — T82818A Embolism of vascular prosthetic devices, implants and grafts, initial encounter: Secondary | ICD-10-CM | POA: Diagnosis not present

## 2019-01-04 DIAGNOSIS — E1022 Type 1 diabetes mellitus with diabetic chronic kidney disease: Secondary | ICD-10-CM | POA: Diagnosis not present

## 2019-01-07 DIAGNOSIS — N2581 Secondary hyperparathyroidism of renal origin: Secondary | ICD-10-CM | POA: Diagnosis not present

## 2019-01-07 DIAGNOSIS — E1022 Type 1 diabetes mellitus with diabetic chronic kidney disease: Secondary | ICD-10-CM | POA: Diagnosis not present

## 2019-01-07 DIAGNOSIS — D631 Anemia in chronic kidney disease: Secondary | ICD-10-CM | POA: Diagnosis not present

## 2019-01-07 DIAGNOSIS — L299 Pruritus, unspecified: Secondary | ICD-10-CM | POA: Diagnosis not present

## 2019-01-07 DIAGNOSIS — N186 End stage renal disease: Secondary | ICD-10-CM | POA: Diagnosis not present

## 2019-01-07 DIAGNOSIS — T82818A Embolism of vascular prosthetic devices, implants and grafts, initial encounter: Secondary | ICD-10-CM | POA: Diagnosis not present

## 2019-01-07 DIAGNOSIS — D689 Coagulation defect, unspecified: Secondary | ICD-10-CM | POA: Diagnosis not present

## 2019-01-07 DIAGNOSIS — Z23 Encounter for immunization: Secondary | ICD-10-CM | POA: Diagnosis not present

## 2019-01-07 DIAGNOSIS — Z992 Dependence on renal dialysis: Secondary | ICD-10-CM | POA: Diagnosis not present

## 2019-01-09 DIAGNOSIS — D689 Coagulation defect, unspecified: Secondary | ICD-10-CM | POA: Diagnosis not present

## 2019-01-09 DIAGNOSIS — Z23 Encounter for immunization: Secondary | ICD-10-CM | POA: Diagnosis not present

## 2019-01-09 DIAGNOSIS — E1022 Type 1 diabetes mellitus with diabetic chronic kidney disease: Secondary | ICD-10-CM | POA: Diagnosis not present

## 2019-01-09 DIAGNOSIS — L299 Pruritus, unspecified: Secondary | ICD-10-CM | POA: Diagnosis not present

## 2019-01-09 DIAGNOSIS — Z992 Dependence on renal dialysis: Secondary | ICD-10-CM | POA: Diagnosis not present

## 2019-01-09 DIAGNOSIS — N186 End stage renal disease: Secondary | ICD-10-CM | POA: Diagnosis not present

## 2019-01-09 DIAGNOSIS — T82818A Embolism of vascular prosthetic devices, implants and grafts, initial encounter: Secondary | ICD-10-CM | POA: Diagnosis not present

## 2019-01-09 DIAGNOSIS — N2581 Secondary hyperparathyroidism of renal origin: Secondary | ICD-10-CM | POA: Diagnosis not present

## 2019-01-09 DIAGNOSIS — D631 Anemia in chronic kidney disease: Secondary | ICD-10-CM | POA: Diagnosis not present

## 2019-01-10 DIAGNOSIS — E108 Type 1 diabetes mellitus with unspecified complications: Secondary | ICD-10-CM | POA: Diagnosis not present

## 2019-01-10 DIAGNOSIS — Z794 Long term (current) use of insulin: Secondary | ICD-10-CM | POA: Diagnosis not present

## 2019-01-10 DIAGNOSIS — E1059 Type 1 diabetes mellitus with other circulatory complications: Secondary | ICD-10-CM | POA: Diagnosis not present

## 2019-01-10 DIAGNOSIS — E1040 Type 1 diabetes mellitus with diabetic neuropathy, unspecified: Secondary | ICD-10-CM | POA: Diagnosis not present

## 2019-01-11 DIAGNOSIS — N2581 Secondary hyperparathyroidism of renal origin: Secondary | ICD-10-CM | POA: Diagnosis not present

## 2019-01-11 DIAGNOSIS — T82818A Embolism of vascular prosthetic devices, implants and grafts, initial encounter: Secondary | ICD-10-CM | POA: Diagnosis not present

## 2019-01-11 DIAGNOSIS — D631 Anemia in chronic kidney disease: Secondary | ICD-10-CM | POA: Diagnosis not present

## 2019-01-11 DIAGNOSIS — L299 Pruritus, unspecified: Secondary | ICD-10-CM | POA: Diagnosis not present

## 2019-01-11 DIAGNOSIS — N186 End stage renal disease: Secondary | ICD-10-CM | POA: Diagnosis not present

## 2019-01-11 DIAGNOSIS — E1022 Type 1 diabetes mellitus with diabetic chronic kidney disease: Secondary | ICD-10-CM | POA: Diagnosis not present

## 2019-01-11 DIAGNOSIS — D689 Coagulation defect, unspecified: Secondary | ICD-10-CM | POA: Diagnosis not present

## 2019-01-11 DIAGNOSIS — Z992 Dependence on renal dialysis: Secondary | ICD-10-CM | POA: Diagnosis not present

## 2019-01-11 DIAGNOSIS — Z23 Encounter for immunization: Secondary | ICD-10-CM | POA: Diagnosis not present

## 2019-01-14 ENCOUNTER — Encounter: Payer: Self-pay | Admitting: Family

## 2019-01-14 ENCOUNTER — Encounter: Payer: Self-pay | Admitting: Surgery

## 2019-01-14 ENCOUNTER — Other Ambulatory Visit (HOSPITAL_COMMUNITY): Payer: Self-pay | Admitting: Family

## 2019-01-14 ENCOUNTER — Encounter: Payer: Self-pay | Admitting: *Deleted

## 2019-01-14 ENCOUNTER — Other Ambulatory Visit: Payer: Self-pay

## 2019-01-14 ENCOUNTER — Encounter: Payer: Medicare Other | Admitting: Family

## 2019-01-14 ENCOUNTER — Ambulatory Visit (INDEPENDENT_AMBULATORY_CARE_PROVIDER_SITE_OTHER): Payer: Medicare Other | Admitting: Family

## 2019-01-14 ENCOUNTER — Ambulatory Visit (HOSPITAL_COMMUNITY)
Admission: RE | Admit: 2019-01-14 | Discharge: 2019-01-14 | Disposition: A | Discharge status: Home / Self Care | Payer: Medicare Other | Source: Ambulatory Visit | Attending: Family | Admitting: Family

## 2019-01-14 VITALS — BP 157/74 | HR 90 | Temp 97.2°F | Resp 18 | Ht 67.0 in | Wt 280.9 lb

## 2019-01-14 DIAGNOSIS — L299 Pruritus, unspecified: Secondary | ICD-10-CM | POA: Diagnosis not present

## 2019-01-14 DIAGNOSIS — T82818A Embolism of vascular prosthetic devices, implants and grafts, initial encounter: Secondary | ICD-10-CM | POA: Diagnosis not present

## 2019-01-14 DIAGNOSIS — M79642 Pain in left hand: Secondary | ICD-10-CM | POA: Insufficient documentation

## 2019-01-14 DIAGNOSIS — D689 Coagulation defect, unspecified: Secondary | ICD-10-CM | POA: Diagnosis not present

## 2019-01-14 DIAGNOSIS — Z992 Dependence on renal dialysis: Secondary | ICD-10-CM

## 2019-01-14 DIAGNOSIS — N2581 Secondary hyperparathyroidism of renal origin: Secondary | ICD-10-CM | POA: Diagnosis not present

## 2019-01-14 DIAGNOSIS — D631 Anemia in chronic kidney disease: Secondary | ICD-10-CM | POA: Diagnosis not present

## 2019-01-14 DIAGNOSIS — Z23 Encounter for immunization: Secondary | ICD-10-CM | POA: Diagnosis not present

## 2019-01-14 DIAGNOSIS — N186 End stage renal disease: Secondary | ICD-10-CM | POA: Diagnosis not present

## 2019-01-14 DIAGNOSIS — E1022 Type 1 diabetes mellitus with diabetic chronic kidney disease: Secondary | ICD-10-CM | POA: Diagnosis not present

## 2019-01-14 DIAGNOSIS — I770 Arteriovenous fistula, acquired: Secondary | ICD-10-CM

## 2019-01-14 MED ORDER — CEPHALEXIN 500 MG PO CAPS: 500.0000 mg | ORAL_CAPSULE | Freq: Two times a day (BID) | ORAL | 0 refills | Status: DC

## 2019-01-14 NOTE — Progress Notes (Signed)
CC: post op evaluation s/p 2nd stage AVF procedure on 12-27-18  History of Present Illness  Dale [name] is a 53 y.o. (Jun 15, 1965) male who is s/p left second stage basilic vein fistula creation on 12-27-18 by Dr. Trula Slade. Prior to the above pt was s/p ligation of left brachiocephalic fistula, left first stage basilic vein fistula creation, and redo exposure of left brachial artery on 11-01-18 by Dr. Trula Slade.  His right central venous system is occluded.    He dialyzes via right IJ TDC on MWF.   He states that he has had previous a thigh access and a right arm access.   Past Medical History:  Diagnosis Date  . Anal fissure   . Anemia of chronic disease   . Arthritis    "hands, right knee" (03/19/2014) no cartlidge in right knee  . CAD (coronary artery disease)    a. Abnl nuc ->LHC (11/15):  mid to dist Dx 80%, mid RCA 99% (functional CTO), dist RCA 80% >> PCI: balloon angioplasty to mid RCA (could not deliver stent) - staged PCI Rotoblator atherectomy/DES to [facility] b. NSTEMI 12/2015 s/p DES to diagonal.  . Cholecystitis    a. 08/27/2011  . Chronic diastolic CHF (congestive heart failure) ([place])    a. Echo 3/13:  EF 55-60%;  b. Echo (11/15):  Mild LVH, EF 60-65%, no RWMA, Gr 1 DD, MAC, mild LAE, normal RVF, mild RAE;  c.  Echo 5/16:  severe LVH, EF 55-60%, no RWMA, Gr 1 DD, MAC, mild LAE  . Claustrophobia    when things get around his face.   . Complication of anesthesia   . Difficult intubation    only once in 2015 at [facility] haven't had a problem since then  . Diverticulosis   . Esophageal dysmotility   . ESRD (end stage renal disease) ([place])    a. 1995 s/p cadaveric transplant w/ susbequent failure after 18 yrs;  b.Dialysis initiated 07/2011 [facility] M-W-F  . Fibromyalgia   . Gastroparesis   . GERD (gastroesophageal reflux disease)   . Gout    PMH  . History of blood transfusion   . HLD (hyperlipidemia)   . Hypertension   . Hypothyroidism   . Inguinal lymphadenopathy     a. bilateral - s/p biopsy 07/2011  . Insulin dependent diabetes mellitus (HCC)    Type I  . Monilial esophagitis ([place])   . Morbid obesity ([place])   . OSA (obstructive sleep apnea)    adjustable bed  . PAD (peripheral artery disease) (HCC)    a. s/p L BKA.  [name] Kitchen Pericardial effusion    a.  Small by CT 08/22/11;  b.  Large by CT 08/27/11. c. Not seen on 11/2015 echo.  . Pneumonia ~ 2007  . PONV (postoperative nausea and vomiting)    vomited once after a procedure.  . T1DM (type 1 diabetes mellitus) ([place])   . Tracheobronchomalacia     Social History Social History   Tobacco Use  . Smoking status: Never Smoker  . Smokeless tobacco: Never Used  Substance Use Topics  . Alcohol use: Yes    Comment: occasional  . Drug use: No    Family History Family History  Problem Relation Age of Onset  . Colon polyps Father   . Diabetes Father   . Hypertension Father   . Hypertension Mother   . Diabetes Mother   . Hyperlipidemia Mother   . Breast cancer Maternal Aunt   .  Bone cancer Maternal Aunt   . Liver cancer Maternal Aunt   . Malignant hyperthermia Neg Hx   . Heart attack Neg Hx   . Stroke Neg Hx   . Colon cancer Neg Hx   . Stomach cancer Neg Hx   . Pancreatic cancer Neg Hx   . Esophageal cancer Neg Hx     Surgical History Past Surgical History:  Procedure Laterality Date  . A/V FISTULAGRAM Left 07/26/2016   Procedure: A/V Fistulagram;  Surgeon: Waynetta Sandy, MD;  Location: [place] [facility];  Service: Cardiovascular;  Laterality: Left;  . A/V FISTULAGRAM N/A 07/11/2017   Procedure: A/V FISTULAGRAM - Left AV;  Surgeon: Waynetta Sandy, MD;  Location: [place] [facility];  Service: Cardiovascular;  Laterality: N/A;  . AMPUTATION OF REPLICATED TOES Right   . ANGIOPLASTY  04/09/2014   RCA   . APPENDECTOMY  ~ 2006  . ARTERIOVENOUS GRAFT PLACEMENT Left 1993?   forearm  . ARTERIOVENOUS GRAFT PLACEMENT Right 1993?   leg  . ARTERIOVENOUS GRAFT PLACEMENT Left     Thigh  . Arteriovenous Graft Removed Left    Thigh  . AV FISTULA PLACEMENT  07/29/2011   Procedure: ARTERIOVENOUS (AV) FISTULA CREATION;  Surgeon: Mal Misty, MD;  Location: [place];  Service: Vascular;  Laterality: Right;  Brachial cephalic  . AV FISTULA PLACEMENT Left 01/20/2015   Procedure: LIGATION OF RIGHT ARTERIOVENOUS FISTULA WITH EXCISION OF ANEURYSM;  Surgeon: Angelia Mould, MD;  Location: [place];  Service: Vascular;  Laterality: Left;  . AV FISTULA PLACEMENT Left 04/30/2015   Procedure: CREATION OF LEFT ARM BRACHIO-CEPHALIC ARTERIOVENOUS (AV) FISTULA ;  Surgeon: Angelia Mould, MD;  Location: [place];  Service: Vascular;  Laterality: Left;  . AV Graft PLACEMENT Left 1993?   "attempted one in my wrist; didn't take"  . BASCILIC VEIN TRANSPOSITION Left 11/01/2018   Procedure: FIRST STAGE BASILIC VEIN TRANSPOSITION  LEFT ARM;  Surgeon: Serafina Mitchell, MD;  Location: [place];  Service: Vascular;  Laterality: Left;  . BASCILIC VEIN TRANSPOSITION Left 12/27/2018   Procedure: SECOND STAGE BASILIC VEIN TRANSPOSITION  LEFT ARM;  Surgeon: Serafina Mitchell, MD;  Location: [place];  Service: Vascular;  Laterality: Left;  . BELOW KNEE LEG AMPUTATION Left 2010  . CARDIAC CATHETERIZATION  03/19/2014  . CARDIAC CATHETERIZATION  03/19/2014   Procedure: CORONARY BALLOON ANGIOPLASTY;  Surgeon: Burnell Blanks, MD;  Location: [facility] CATH LAB;  Service: Cardiovascular;;  . CARDIAC CATHETERIZATION N/A 01/01/2016   Procedure: Left Heart Cath and Coronary Angiography;  Surgeon: Troy Sine, MD;  Location: [place] [facility];  Service: Cardiovascular;  Laterality: N/A;  . CARDIAC CATHETERIZATION N/A 01/01/2016   Procedure: Coronary Stent Intervention;  Surgeon: Troy Sine, MD;  Location: [place] [facility];  Service: Cardiovascular;  Laterality: N/A;  . CARPAL TUNNEL RELEASE Bilateral after 2005  . CATARACT EXTRACTION W/ INTRAOCULAR LENS  IMPLANT, BILATERAL Bilateral 1990's  . COLONOSCOPY  08/29/2011    Procedure: COLONOSCOPY;  Surgeon: Jerene Bears, MD;  Location: [place];  Service: Gastroenterology;  Laterality: N/A;  . COLONOSCOPY WITH PROPOFOL N/A 04/26/2016   Procedure: COLONOSCOPY WITH PROPOFOL;  Surgeon: Jerene Bears, MD;  Location: [facility] ENDOSCOPY;  Service: Gastroenterology;  Laterality: N/A;  . CORONARY ANGIOPLASTY WITH STENT PLACEMENT  04/09/2014       ptca/des mid lad   . ESOPHAGOGASTRODUODENOSCOPY N/A 12/25/2015   Procedure: ESOPHAGOGASTRODUODENOSCOPY (EGD);  Surgeon: Mauri Pole, MD;  Location: [facility]  ENDOSCOPY;  Service: Endoscopy;  Laterality: N/A;  bedside  . ESOPHAGOGASTRODUODENOSCOPY (EGD) WITH PROPOFOL N/A 04/26/2016   Procedure: ESOPHAGOGASTRODUODENOSCOPY (EGD) WITH PROPOFOL;  Surgeon: Jerene Bears, MD;  Location: [facility]OS[facility];  Service: Gastroenterology;  Laterality: N/A;  . EYE SURGERY Bilateral    cataract  . FEMORAL ARTERY REPAIR  04/09/2014   ANGIOSEAL  . INSERTION OF DIALYSIS CATHETER  08/01/2011   Procedure: INSERTION OF DIALYSIS CATHETER;  Surgeon: Rosetta Posner, MD;  Location: [place];  Service: Vascular;  Laterality: Right;  insertion of dialysis catheter on right internal jugular vein  . INSERTION OF DIALYSIS CATHETER Right 01/20/2015   Procedure: INSERTION OF DIALYSIS CATHETER - RIGHT INTERNAL JUGULAR ;  Surgeon: Angelia Mould, MD;  Location: [place];  Service: Vascular;  Laterality: Right;  . INSERTION OF DIALYSIS CATHETER Right 08/02/2018   Procedure: Insertion Of Dialysis Catheter;  Surgeon: Angelia Mould, MD;  Location: [facility] OR;  Service: Vascular;  Laterality: Right;  . KIDNEY TRANSPLANT  04/25/1993  . LEFT HEART CATHETERIZATION WITH CORONARY ANGIOGRAM N/A 03/19/2014   Procedure: LEFT HEART CATHETERIZATION WITH CORONARY ANGIOGRAM;  Surgeon: Burnell Blanks, MD;  Location: [facility] CATH LAB;  Service: Cardiovascular;  Laterality: N/A;  . LIGATION OF ARTERIOVENOUS  FISTULA Left 11/01/2018   Procedure: Ligation Of  Brachio Cephalic Arteriovenous   Fistula;  Surgeon: Serafina Mitchell, MD;  Location: [facility] OR;  Service: Vascular;  Laterality: Left;  . LYMPH NODE BIOPSY  08/10/2011   Procedure: LYMPH NODE BIOPSY;  Surgeon: Merrie Roof, MD;  Location: [place];  Service: General;  Laterality: Left;  left groin lymph biopsy  . NEUROPLASTY / TRANSPOSITION ULNAR NERVE AT ELBOW Left after 2005  . PATCH ANGIOPLASTY Left 08/09/2016   Procedure: LEFT  ARTERIOVENOUS FISTULA PATCH ANGIOPLASTY;  Surgeon: Waynetta Sandy, MD;  Location: [place];  Service: Vascular;  Laterality: Left;  . PERCUTANEOUS CORONARY ROTOBLATOR INTERVENTION (PCI-R) N/A 04/09/2014   Procedure: PERCUTANEOUS CORONARY ROTOBLATOR INTERVENTION (PCI-R);  Surgeon: Burnell Blanks, MD;  Location: [facility] CATH LAB;  Service: Cardiovascular;  Laterality: N/A;  . PERIPHERAL VASCULAR BALLOON ANGIOPLASTY  07/11/2017   Procedure: PERIPHERAL VASCULAR BALLOON ANGIOPLASTY;  Surgeon: Waynetta Sandy, MD;  Location: [place] [facility];  Service: Cardiovascular;;  left arm AV fistula  . PERIPHERAL VASCULAR CATHETERIZATION N/A 12/25/2014   Procedure: Upper Extremity Venography;  Surgeon: Conrad [name], MD;  Location: [place] [facility];  Service: Cardiovascular;  Laterality: N/A;  . PERIPHERAL VASCULAR CATHETERIZATION Left 02/24/2016   Procedure: Fistulagram;  Surgeon: Waynetta Sandy, MD;  Location: [place] [facility];  Service: Cardiovascular;  Laterality: Left;  . PERIPHERAL VASCULAR CATHETERIZATION Left 02/24/2016   Procedure: Peripheral Vascular Intervention;  Surgeon: Waynetta Sandy, MD;  Location: [place] [facility];  Service: Cardiovascular;  Laterality: Left;  AV FISTULA  . PERITONEAL CATHETER INSERTION    . PERITONEAL CATHETER REMOVAL    . REVISON OF ARTERIOVENOUS FISTULA Right 9/92/4268   Procedure: PLICATION / REVISION OF ARTERIOVENOUS FISTULA;  Surgeon: Angelia Mould, MD;  Location: [place];  Service: Vascular;  Laterality: Right;  . REVISON OF  ARTERIOVENOUS FISTULA Left 09/01/2015   Procedure: SUPERFICIALIZATION OF LEFT ARM BRACHIOCEPHALIC ARTERIOVENOUS FISTULA;  Surgeon: Angelia Mould, MD;  Location: [place];  Service: Vascular;  Laterality: Left;  . REVISON OF ARTERIOVENOUS FISTULA Left 08/09/2016   Procedure: REVISION OF ARTERIAL ANASTOMOSIS OF ARTERIOVENOUS FISTULA;  Surgeon: Waynetta Sandy, MD;  Location: [place];  Service: Vascular;  Laterality: Left;  . SHOULDER ARTHROSCOPY Left ~ 2006   frozen  . THROMBECTOMY W/ EMBOLECTOMY Left 08/02/2018   Procedure: THROMBECTOMY ARTERIOVENOUS FISTULA;  Surgeon: Angelia Mould, MD;  Location: [place];  Service: Vascular;  Laterality: Left;  . TOE AMPUTATION Bilateral after 2005  . UMBILICAL HERNIA REPAIR  1990's  . UPPER EXTREMITY VENOGRAPHY Bilateral 10/16/2018   Procedure: UPPER EXTREMITY VENOGRAPHY;  Surgeon: Serafina Mitchell, MD;  Location: [place] [facility];  Service: Cardiovascular;  Laterality: Bilateral;  . VENOGRAM Right 07/28/2011   Procedure: VENOGRAM;  Surgeon: Conrad Pateros, MD;  Location: [facility] CATH LAB;  Service: Cardiovascular;  Laterality: Right;  . VITRECTOMY Bilateral     Allergies  Allergen Reactions  . Iodinated Diagnostic Agents Itching, Rash and Other (See Comments)    Systemic itching and transient rash appearance within hours of receiving contrast    . Prednisone Other (See Comments)    Increases sugar levels  . Adhesive [Tape] Itching and Other (See Comments)    Blisters on arm from adhesive tape at dialysis   . Amoxicillin Itching, Rash and Other (See Comments)    Did it involve swelling of the face/tongue/throat, SOB, or low BP? No Did it involve sudden or severe rash/hives, skin peeling, or any reaction on the inside of your mouth or nose? No Did you need to seek medical attention at a hospital or doctor's office? No When did it last happen?10+ years If all above answers are "NO", may proceed with cephalosporin use.   .  Clindamycin/Lincomycin Hives and Itching  . Enalapril Rash and Cough    Cough  . Mobic [Meloxicam] Hives and Rash  . Morphine Hives, Nausea Only and Rash  . Brilinta [Ticagrelor] Nausea And Vomiting  . Ciprofloxacin Itching and Rash  . Clopidogrel Rash  . Codeine Hives, Itching and Rash  . Doxycycline Rash  . Hydrocodone Rash  . Levofloxacin Hives and Itching  . Oxycodone Rash  . Rocephin [Ceftriaxone] Itching    Current Outpatient Medications  Medication Sig Dispense Refill  . acetaminophen (TYLENOL) 500 MG tablet Take 1,000 mg by mouth See admin instructions. Take 1000 mg at bedtime, may take an additional 1000 mg dose as needed for pain    . allopurinol (ZYLOPRIM) 100 MG tablet Take 2 tablets (200 mg total) by mouth daily. 180 tablet 3  . aspirin EC 81 MG tablet Take 81 mg by mouth at bedtime.    [name] Kitchen atorvastatin (LIPITOR) 10 MG tablet Take 10 mg by mouth daily.     . B Complex-C-Folic Acid (DIALYVITE 620) 0.8 MG TABS Take 1 tablet by mouth daily.    . calcium acetate (PHOSLO) 667 MG capsule Take 667-1,334 mg by mouth See admin instructions. Take 1334 mg with each meal and 667 mg with each snack    . cetirizine (ZYRTEC) 10 MG tablet Take 10 mg by mouth daily.    . diphenhydrAMINE (BENADRYL) 25 MG tablet Take 50 mg by mouth at bedtime.     . docusate sodium (COLACE) 100 MG capsule Take 200 mg by mouth 2 (two) times daily.     . famotidine (PEPCID) 20 MG tablet Take 20 mg by mouth daily as needed for heartburn or indigestion.     . fluticasone (FLONASE) 50 MCG/ACT nasal spray Place 2 sprays into both nostrils daily as needed for allergies.     [name] Kitchen gabapentin (NEURONTIN) 300 MG capsule Take 300 mg by mouth at bedtime.    [name] Kitchen GLUCAGON EMERGENCY 1  MG injection Inject 1 mg into the muscle daily as needed (low blood sugar).     . Glucose-Cholecalciferol (RELION GLUCOSE SHOT) LIQD Take 1 Dose by mouth daily as needed (low blood sugar).    . hydrocortisone 2.5 % cream Apply 1 application  topically 2 (two) times daily as needed (rash / itching).    [name] Kitchen HYDROmorphone (DILAUDID) 2 MG tablet Take 2 mg by mouth 2 (two) times daily as needed for moderate pain or severe pain.     . hydrOXYzine (ATARAX/VISTARIL) 10 MG tablet Take 10 mg by mouth 3 (three) times daily as needed for itching.    . insulin lispro (HUMALOG) 100 UNIT/ML injection Inject 75-120 Units into the skin continuous. Per insulin pump    . levothyroxine (SYNTHROID, LEVOTHROID) 112 MCG tablet Take 112 mcg by mouth at bedtime.     [name] Kitchen loperamide (IMODIUM A-D) 2 MG tablet Take 2-4 mg by mouth 4 (four) times daily as needed for diarrhea or loose stools.    . Melatonin 5 MG TABS Take 5 mg by mouth at bedtime.    . metoCLOPramide (REGLAN) 10 MG tablet Take 10 mg by mouth 4 (four) times daily -  before meals and at bedtime.     . midodrine (PROAMATINE) 10 MG tablet Take 1 tablet (10 mg total) by mouth 3 (three) times daily. Takes 1 tab in mornings of dialysis and takes 2 tabs 30 mins before starting dialysis (m,w,f) (Patient taking differently: Take 10 mg by mouth See admin instructions. Take 10 mg 3 times daily on Sun, Tue, Thur, and Sat.  Take 10 mg before dialysis and 20 mg during dialysis treatment on Mon, Wed, and Fri.)    . nitroGLYCERIN (NITROSTAT) 0.4 MG SL tablet Place 1 tablet (0.4 mg total) under the tongue every 5 (five) minutes as needed for chest pain. 25 tablet 4  . Nutritional Supplements (FEEDING SUPPLEMENT, NEPRO CARB STEADY,) LIQD Take 237 mLs by mouth daily. (Patient taking differently: Take 237 mLs by mouth 2 (two) times a week. ) 30 Can 0  . nystatin (NYSTATIN) powder Apply 1 g topically daily as needed (yeast).    Vladimir Faster Glycol-Propyl Glycol (SYSTANE ULTRA PF OP) Place 1 drop into both eyes every 2 (two) hours as needed (dry eyes).    Vladimir Faster Glycol-Propyl Glycol (SYSTANE) 0.4-0.3 % SOLN Place 1 drop into both eyes 4 (four) times daily.     . polyethylene glycol (MIRALAX / GLYCOLAX) packet Take 17 g by  mouth daily. (Patient taking differently: Take 17 g by mouth daily as needed for mild constipation. ) 14 each 0  . prasugrel (EFFIENT) 10 MG TABS tablet TAKE 1 TABLET BY MOUTH EVERY DAY (Patient taking differently: Take 10 mg by mouth daily. ) 90 tablet 0  . promethazine (PHENERGAN) 25 MG tablet Take 25 mg by mouth every 6 (six) hours as needed for nausea or vomiting.    [name] Kitchen testosterone cypionate (DEPOTESTOSTERONE CYPIONATE) 200 MG/ML injection Inject 200 mg into the muscle every 14 (fourteen) days.   1  . VELPHORO 500 MG chewable tablet Chew 500-1,000 mg by mouth See admin instructions. Chew 1,000 mg by mouth three times a day with meals and 500 mg twice daily with snacks  10  . cephALEXin (KEFLEX) 500 MG capsule Take 1 capsule (500 mg total) by mouth 2 (two) times daily. (Patient not taking: Reported on 01/14/2019) 20 capsule 0  . rOPINIRole (REQUIP) 2 MG tablet Take 1 tablet (2 mg total) by mouth  at bedtime. Take 2 mg by mouth every night at bedtime (Patient taking differently: Take 2 mg by mouth daily with supper. ) 30 tablet 0   No current facility-administered medications for this visit.      REVIEW OF SYSTEMS: see HPI for pertinent positives and negatives    PHYSICAL EXAMINATION:  Vitals:   01/14/19 1459  BP: (!) 157/74  Pulse: 90  Resp: 18  Temp: (!) 97.2 F (36.2 C)  TempSrc: Temporal  SpO2: 94%  Weight: 280 lb 13.9 oz (127.4 kg)  Height: _0  (1.702 m)   Body mass index is 43.99 kg/m.  General: Morbidly obese male in NAD, seated in w/c, accompanied by his wife HEENT:  No gross abnormalities Pulmonary: Respirations are non-labored Musculoskeletal: There are no major deformities. Left leg prosthesis in place Neurologic: No focal weakness, + paresthesias at left forearm, medial aspect, and left 3rd and 4th fingers  Skin: There are no ulcer or rashes noted, see photo below of left upper arm AVF incision. Psychiatric: The patient has normal affect. Cardiovascular: There is  a regular rate and rhythm. Right IJ TDC noted   Left upper arm, medial aspect, mild erythema and swelling, watery clear drainage on dressing that was removed.  AVF with palpable thrill and audible bruit Left radial pulse is faintly palpable Left hand grip strength is 3/5   DATA  Steal Duplex Left Arm (01-14-19); Findings: Summary: The right radial PSV has increased with compression. Left second digit pressure increased from 48 mm HG to 70 mm HG with AVF compression. Left radial artery peak systolic velocity increased from 88.6 cm/sec to 125 cm/sec with AVF compression. Limited imaging showed a patent brachio/ basilic AVF and a calcified ulnar artery with very little flow distally. Findings suggest distal ulnar artery stenosis or occlusion. Patent left brachio-basilic AVF.    Medical Decision Making  Dale [name] is a 53 y.o. male who is s/p left second stage basilic vein fistula creation on 12-27-18 by Dr. Trula Slade. Prior to the above pt was s/p ligation of left brachiocephalic fistula, left first stage basilic vein fistula creation, and redo exposure of left brachial artery on 11-01-18 by Dr. Trula Slade.  His right central venous system is occluded.    Pt reports pain, tingling and numbness in his left 3rd and 4th fingers, and that this seems to be worsening. He also reports numbness at the medial aspect of his left lower arm, states he afraid that he might injure his left forearm since he has no sensation at the medial aspect.   Wife has been cleansing the left upper arm AVF with wound cleanser daily and applying an absorptive dressing since she states there is some thin drainage. Pt denies fever or chills.  Wife states that the left upper arm looks much improved.   Dr. Trula Slade spoke with pt and wife and examined pt. Will obtain steal duplex of left arm now; results above, sonographer discussed results with Dr. Trula Slade.   All staples removed from his left upper arm AVF incision,  wound cleansed with wound cleanser, steri strips applied, dressing applied.   He has taken Keflex and Bactrim before with no adverse effects; he has multiple allergies.  I electronically sent a prescription for Keflex to his pharmacy: 500 mg capsule po bid x 10 days, 0 refills.   Pt instructed by Dr. Trula Slade to squeeze a ball or squeezable object multiple times/day using his left hand.  Dr. Trula Slade again spoke with pt and  wife; pt to return in 2 weeks with repeat steal duplex, see me or PA or Dr. Freeman Caldron Lailani Tool, RN, MSN, FNP-C [facility] Office: [phone]  01/14/2019, 3:08 PM  Clinic MD: Trula Slade

## 2019-01-15 ENCOUNTER — Other Ambulatory Visit: Payer: Self-pay | Admitting: Cardiovascular Disease

## 2019-01-15 DIAGNOSIS — M1711 Unilateral primary osteoarthritis, right knee: Secondary | ICD-10-CM | POA: Diagnosis not present

## 2019-01-15 MED ORDER — PRASUGREL HCL 10 MG PO TABS: 10.0000 mg | ORAL_TABLET | Freq: Every day | ORAL | 0 refills | Status: DC

## 2019-01-15 NOTE — Telephone Encounter (Signed)
Pt's medication was sent to pt's pharmacy as requested. Confirmation received.  °

## 2019-01-15 NOTE — Telephone Encounter (Signed)
New Message      *STAT* If patient is at the pharmacy, call can be transferred to refill team.   1. Which medications need to be refilled? (please list name of each medication and dose if known) Effient 10mg    2. Which pharmacy/location (including street and city if local pharmacy) is medication to be sent to? [facility] in [place] on [address]   3. Do they need a 30 day or 90 day supply? 90 day supply

## 2019-01-16 DIAGNOSIS — N2581 Secondary hyperparathyroidism of renal origin: Secondary | ICD-10-CM | POA: Diagnosis not present

## 2019-01-16 DIAGNOSIS — Z23 Encounter for immunization: Secondary | ICD-10-CM | POA: Diagnosis not present

## 2019-01-16 DIAGNOSIS — E1022 Type 1 diabetes mellitus with diabetic chronic kidney disease: Secondary | ICD-10-CM | POA: Diagnosis not present

## 2019-01-16 DIAGNOSIS — T82818A Embolism of vascular prosthetic devices, implants and grafts, initial encounter: Secondary | ICD-10-CM | POA: Diagnosis not present

## 2019-01-16 DIAGNOSIS — N186 End stage renal disease: Secondary | ICD-10-CM | POA: Diagnosis not present

## 2019-01-16 DIAGNOSIS — Z992 Dependence on renal dialysis: Secondary | ICD-10-CM | POA: Diagnosis not present

## 2019-01-16 DIAGNOSIS — M1711 Unilateral primary osteoarthritis, right knee: Secondary | ICD-10-CM | POA: Diagnosis not present

## 2019-01-16 DIAGNOSIS — L299 Pruritus, unspecified: Secondary | ICD-10-CM | POA: Diagnosis not present

## 2019-01-16 DIAGNOSIS — D689 Coagulation defect, unspecified: Secondary | ICD-10-CM | POA: Diagnosis not present

## 2019-01-16 DIAGNOSIS — D631 Anemia in chronic kidney disease: Secondary | ICD-10-CM | POA: Diagnosis not present

## 2019-01-18 DIAGNOSIS — Z23 Encounter for immunization: Secondary | ICD-10-CM | POA: Diagnosis not present

## 2019-01-18 DIAGNOSIS — Z992 Dependence on renal dialysis: Secondary | ICD-10-CM | POA: Diagnosis not present

## 2019-01-18 DIAGNOSIS — D689 Coagulation defect, unspecified: Secondary | ICD-10-CM | POA: Diagnosis not present

## 2019-01-18 DIAGNOSIS — L299 Pruritus, unspecified: Secondary | ICD-10-CM | POA: Diagnosis not present

## 2019-01-18 DIAGNOSIS — D631 Anemia in chronic kidney disease: Secondary | ICD-10-CM | POA: Diagnosis not present

## 2019-01-18 DIAGNOSIS — N186 End stage renal disease: Secondary | ICD-10-CM | POA: Diagnosis not present

## 2019-01-18 DIAGNOSIS — T82818A Embolism of vascular prosthetic devices, implants and grafts, initial encounter: Secondary | ICD-10-CM | POA: Diagnosis not present

## 2019-01-18 DIAGNOSIS — E1022 Type 1 diabetes mellitus with diabetic chronic kidney disease: Secondary | ICD-10-CM | POA: Diagnosis not present

## 2019-01-18 DIAGNOSIS — N2581 Secondary hyperparathyroidism of renal origin: Secondary | ICD-10-CM | POA: Diagnosis not present

## 2019-01-21 DIAGNOSIS — T82818A Embolism of vascular prosthetic devices, implants and grafts, initial encounter: Secondary | ICD-10-CM | POA: Diagnosis not present

## 2019-01-21 DIAGNOSIS — Z992 Dependence on renal dialysis: Secondary | ICD-10-CM | POA: Diagnosis not present

## 2019-01-21 DIAGNOSIS — D631 Anemia in chronic kidney disease: Secondary | ICD-10-CM | POA: Diagnosis not present

## 2019-01-21 DIAGNOSIS — N186 End stage renal disease: Secondary | ICD-10-CM | POA: Diagnosis not present

## 2019-01-21 DIAGNOSIS — D689 Coagulation defect, unspecified: Secondary | ICD-10-CM | POA: Diagnosis not present

## 2019-01-21 DIAGNOSIS — E1022 Type 1 diabetes mellitus with diabetic chronic kidney disease: Secondary | ICD-10-CM | POA: Diagnosis not present

## 2019-01-21 DIAGNOSIS — N2581 Secondary hyperparathyroidism of renal origin: Secondary | ICD-10-CM | POA: Diagnosis not present

## 2019-01-21 DIAGNOSIS — Z23 Encounter for immunization: Secondary | ICD-10-CM | POA: Diagnosis not present

## 2019-01-21 DIAGNOSIS — L299 Pruritus, unspecified: Secondary | ICD-10-CM | POA: Diagnosis not present

## 2019-01-23 DIAGNOSIS — T8612 Kidney transplant failure: Secondary | ICD-10-CM | POA: Diagnosis not present

## 2019-01-23 DIAGNOSIS — D689 Coagulation defect, unspecified: Secondary | ICD-10-CM | POA: Diagnosis not present

## 2019-01-23 DIAGNOSIS — T82818A Embolism of vascular prosthetic devices, implants and grafts, initial encounter: Secondary | ICD-10-CM | POA: Diagnosis not present

## 2019-01-23 DIAGNOSIS — N2581 Secondary hyperparathyroidism of renal origin: Secondary | ICD-10-CM | POA: Diagnosis not present

## 2019-01-23 DIAGNOSIS — D631 Anemia in chronic kidney disease: Secondary | ICD-10-CM | POA: Diagnosis not present

## 2019-01-23 DIAGNOSIS — Z992 Dependence on renal dialysis: Secondary | ICD-10-CM | POA: Diagnosis not present

## 2019-01-23 DIAGNOSIS — E1022 Type 1 diabetes mellitus with diabetic chronic kidney disease: Secondary | ICD-10-CM | POA: Diagnosis not present

## 2019-01-23 DIAGNOSIS — L299 Pruritus, unspecified: Secondary | ICD-10-CM | POA: Diagnosis not present

## 2019-01-23 DIAGNOSIS — Z23 Encounter for immunization: Secondary | ICD-10-CM | POA: Diagnosis not present

## 2019-01-23 DIAGNOSIS — N186 End stage renal disease: Secondary | ICD-10-CM | POA: Diagnosis not present

## 2019-01-24 ENCOUNTER — Other Ambulatory Visit: Payer: Self-pay

## 2019-01-24 DIAGNOSIS — Z992 Dependence on renal dialysis: Secondary | ICD-10-CM

## 2019-01-24 DIAGNOSIS — N186 End stage renal disease: Secondary | ICD-10-CM

## 2019-01-25 DIAGNOSIS — D509 Iron deficiency anemia, unspecified: Secondary | ICD-10-CM | POA: Diagnosis not present

## 2019-01-25 DIAGNOSIS — Z992 Dependence on renal dialysis: Secondary | ICD-10-CM | POA: Diagnosis not present

## 2019-01-25 DIAGNOSIS — N186 End stage renal disease: Secondary | ICD-10-CM | POA: Diagnosis not present

## 2019-01-25 DIAGNOSIS — D631 Anemia in chronic kidney disease: Secondary | ICD-10-CM | POA: Diagnosis not present

## 2019-01-25 DIAGNOSIS — D689 Coagulation defect, unspecified: Secondary | ICD-10-CM | POA: Diagnosis not present

## 2019-01-25 DIAGNOSIS — E1022 Type 1 diabetes mellitus with diabetic chronic kidney disease: Secondary | ICD-10-CM | POA: Diagnosis not present

## 2019-01-25 DIAGNOSIS — T82818A Embolism of vascular prosthetic devices, implants and grafts, initial encounter: Secondary | ICD-10-CM | POA: Diagnosis not present

## 2019-01-25 DIAGNOSIS — N2581 Secondary hyperparathyroidism of renal origin: Secondary | ICD-10-CM | POA: Diagnosis not present

## 2019-01-28 DIAGNOSIS — N2581 Secondary hyperparathyroidism of renal origin: Secondary | ICD-10-CM | POA: Diagnosis not present

## 2019-01-28 DIAGNOSIS — T82818A Embolism of vascular prosthetic devices, implants and grafts, initial encounter: Secondary | ICD-10-CM | POA: Diagnosis not present

## 2019-01-28 DIAGNOSIS — D631 Anemia in chronic kidney disease: Secondary | ICD-10-CM | POA: Diagnosis not present

## 2019-01-28 DIAGNOSIS — E1022 Type 1 diabetes mellitus with diabetic chronic kidney disease: Secondary | ICD-10-CM | POA: Diagnosis not present

## 2019-01-28 DIAGNOSIS — Z992 Dependence on renal dialysis: Secondary | ICD-10-CM | POA: Diagnosis not present

## 2019-01-28 DIAGNOSIS — D509 Iron deficiency anemia, unspecified: Secondary | ICD-10-CM | POA: Diagnosis not present

## 2019-01-28 DIAGNOSIS — D689 Coagulation defect, unspecified: Secondary | ICD-10-CM | POA: Diagnosis not present

## 2019-01-28 DIAGNOSIS — N186 End stage renal disease: Secondary | ICD-10-CM | POA: Diagnosis not present

## 2019-01-30 ENCOUNTER — Ambulatory Visit (INDEPENDENT_AMBULATORY_CARE_PROVIDER_SITE_OTHER): Payer: Self-pay | Admitting: Family

## 2019-01-30 ENCOUNTER — Encounter: Payer: Self-pay | Admitting: Family

## 2019-01-30 ENCOUNTER — Ambulatory Visit (HOSPITAL_COMMUNITY)
Admission: RE | Admit: 2019-01-30 | Discharge: 2019-01-30 | Disposition: A | Discharge status: Home / Self Care | Payer: Medicare Other | Source: Ambulatory Visit | Attending: Family | Admitting: Family

## 2019-01-30 ENCOUNTER — Other Ambulatory Visit: Payer: Self-pay

## 2019-01-30 VITALS — BP 142/65 | HR 78 | Temp 97.4°F | Resp 20 | Ht 67.0 in | Wt 280.0 lb

## 2019-01-30 DIAGNOSIS — E1022 Type 1 diabetes mellitus with diabetic chronic kidney disease: Secondary | ICD-10-CM | POA: Diagnosis not present

## 2019-01-30 DIAGNOSIS — D689 Coagulation defect, unspecified: Secondary | ICD-10-CM | POA: Diagnosis not present

## 2019-01-30 DIAGNOSIS — D509 Iron deficiency anemia, unspecified: Secondary | ICD-10-CM | POA: Diagnosis not present

## 2019-01-30 DIAGNOSIS — D631 Anemia in chronic kidney disease: Secondary | ICD-10-CM | POA: Diagnosis not present

## 2019-01-30 DIAGNOSIS — N2581 Secondary hyperparathyroidism of renal origin: Secondary | ICD-10-CM | POA: Diagnosis not present

## 2019-01-30 DIAGNOSIS — Z992 Dependence on renal dialysis: Secondary | ICD-10-CM

## 2019-01-30 DIAGNOSIS — I770 Arteriovenous fistula, acquired: Secondary | ICD-10-CM

## 2019-01-30 DIAGNOSIS — T82898D Other specified complication of vascular prosthetic devices, implants and grafts, subsequent encounter: Secondary | ICD-10-CM

## 2019-01-30 DIAGNOSIS — N186 End stage renal disease: Secondary | ICD-10-CM

## 2019-01-30 DIAGNOSIS — T82818A Embolism of vascular prosthetic devices, implants and grafts, initial encounter: Secondary | ICD-10-CM | POA: Diagnosis not present

## 2019-01-30 NOTE — Progress Notes (Signed)
CC: continued steal symptoms left hand and forearm, left upper arm AVF, ESRD on hemodialysis   History of Present Illness  Dale [name] is a 53 y.o. (11-23-1965) male who is s/p left second stage basilic vein fistula creation on 12-27-18 by Dr. Trula Slade. Prior to the above pt was s/p ligation of left brachiocephalic fistula, left first stage basilic vein fistula creation, and redo exposure of left brachial arteryon 11-01-18 by Dr. Trula Slade.  His right central venous system is occluded.    He dialyzesvia right IJ TDC on MWF.   He states that he has had previous a thigh access and a right arm access.  He has had a kidney transplant that failed.   He reports pain and numbness at the tips of his left 3rd and 4th fingers, and numbness along the ulnar aspect of his left forearm.   Past Medical History:  Diagnosis Date  . Anal fissure   . Anemia of chronic disease   . Arthritis    "hands, right knee" (03/19/2014) no cartlidge in right knee  . CAD (coronary artery disease)    a. Abnl nuc ->LHC (11/15):  mid to dist Dx 80%, mid RCA 99% (functional CTO), dist RCA 80% >> PCI: balloon angioplasty to mid RCA (could not deliver stent) - staged PCI Rotoblator atherectomy/DES to [facility]. b. NSTEMI 12/2015 s/p DES to diagonal.  . Cholecystitis    a. 08/27/2011  . Chronic diastolic CHF (congestive heart failure) ([place])    a. Echo 3/13:  EF 55-60%;  b. Echo (11/15):  Mild LVH, EF 60-65%, no RWMA, Gr 1 DD, MAC, mild LAE, normal RVF, mild RAE;  c.  Echo 5/16:  severe LVH, EF 55-60%, no RWMA, Gr 1 DD, MAC, mild LAE  . Claustrophobia    when things get around his face.   . Complication of anesthesia   . Difficult intubation    only once in 2015 at [facility] haven't had a problem since then  . Diverticulosis   . Esophageal dysmotility   . ESRD (end stage renal disease) ([place])    a. 1995 s/p cadaveric transplant w/ susbequent failure after 18 yrs;  b.Dialysis initiated 07/2011 [facility] M-W-F  .  Fibromyalgia   . Gastroparesis   . GERD (gastroesophageal reflux disease)   . Gout    PMH  . History of blood transfusion   . HLD (hyperlipidemia)   . Hypertension   . Hypothyroidism   . Inguinal lymphadenopathy    a. bilateral - s/p biopsy 07/2011  . Insulin dependent diabetes mellitus    Type I  . Monilial esophagitis ([place])   . Morbid obesity ([place])   . OSA (obstructive sleep apnea)    adjustable bed  . PAD (peripheral artery disease) (HCC)    a. s/p L BKA.  [name] Kitchen Pericardial effusion    a.  Small by CT 08/22/11;  b.  Large by CT 08/27/11. c. Not seen on 11/2015 echo.  . Pneumonia ~ 2007  . PONV (postoperative nausea and vomiting)    vomited once after a procedure.  . T1DM (type 1 diabetes mellitus) ([place])   . Tracheobronchomalacia     Social History Social History   Tobacco Use  . Smoking status: Never Smoker  . Smokeless tobacco: Never Used  Substance Use Topics  . Alcohol use: Yes    Comment: occasional  . Drug use: No    Family History Family History  Problem Relation Age of Onset  .  Colon polyps Father   . Diabetes Father   . Hypertension Father   . Hypertension Mother   . Diabetes Mother   . Hyperlipidemia Mother   . Breast cancer Maternal Aunt   . Bone cancer Maternal Aunt   . Liver cancer Maternal Aunt   . Malignant hyperthermia Neg Hx   . Heart attack Neg Hx   . Stroke Neg Hx   . Colon cancer Neg Hx   . Stomach cancer Neg Hx   . Pancreatic cancer Neg Hx   . Esophageal cancer Neg Hx     Surgical History Past Surgical History:  Procedure Laterality Date  . A/V FISTULAGRAM Left 07/26/2016   Procedure: A/V Fistulagram;  Surgeon: Waynetta Sandy, MD;  Location: [place] [facility];  Service: Cardiovascular;  Laterality: Left;  . A/V FISTULAGRAM N/A 07/11/2017   Procedure: A/V FISTULAGRAM - Left AV;  Surgeon: Waynetta Sandy, MD;  Location: [place] [facility];  Service: Cardiovascular;  Laterality: N/A;  . AMPUTATION OF REPLICATED TOES  Right   . ANGIOPLASTY  04/09/2014   RCA   . APPENDECTOMY  ~ 2006  . ARTERIOVENOUS GRAFT PLACEMENT Left 1993?   forearm  . ARTERIOVENOUS GRAFT PLACEMENT Right 1993?   leg  . ARTERIOVENOUS GRAFT PLACEMENT Left    Thigh  . Arteriovenous Graft Removed Left    Thigh  . AV FISTULA PLACEMENT  07/29/2011   Procedure: ARTERIOVENOUS (AV) FISTULA CREATION;  Surgeon: Mal Misty, MD;  Location: [place];  Service: Vascular;  Laterality: Right;  Brachial cephalic  . AV FISTULA PLACEMENT Left 01/20/2015   Procedure: LIGATION OF RIGHT ARTERIOVENOUS FISTULA WITH EXCISION OF ANEURYSM;  Surgeon: Angelia Mould, MD;  Location: [place];  Service: Vascular;  Laterality: Left;  . AV FISTULA PLACEMENT Left 04/30/2015   Procedure: CREATION OF LEFT ARM BRACHIO-CEPHALIC ARTERIOVENOUS (AV) FISTULA ;  Surgeon: Angelia Mould, MD;  Location: [place];  Service: Vascular;  Laterality: Left;  . AV Graft PLACEMENT Left 1993?   "attempted one in my wrist; didn't take"  . BASCILIC VEIN TRANSPOSITION Left 11/01/2018   Procedure: FIRST STAGE BASILIC VEIN TRANSPOSITION  LEFT ARM;  Surgeon: Serafina Mitchell, MD;  Location: [place];  Service: Vascular;  Laterality: Left;  . BASCILIC VEIN TRANSPOSITION Left 12/27/2018   Procedure: SECOND STAGE BASILIC VEIN TRANSPOSITION  LEFT ARM;  Surgeon: Serafina Mitchell, MD;  Location: [place];  Service: Vascular;  Laterality: Left;  . BELOW KNEE LEG AMPUTATION Left 2010  . CARDIAC CATHETERIZATION  03/19/2014  . CARDIAC CATHETERIZATION  03/19/2014   Procedure: CORONARY BALLOON ANGIOPLASTY;  Surgeon: Burnell Blanks, MD;  Location: [facility] CATH LAB;  Service: Cardiovascular;;  . CARDIAC CATHETERIZATION N/A 01/01/2016   Procedure: Left Heart Cath and Coronary Angiography;  Surgeon: Troy Sine, MD;  Location: [place] CV LAB;  Service: Cardiovascular;  Laterality: N/A;  . CARDIAC CATHETERIZATION N/A 01/01/2016   Procedure: Coronary Stent Intervention;  Surgeon: Troy Sine, MD;   Location: [place] [facility];  Service: Cardiovascular;  Laterality: N/A;  . CARPAL TUNNEL RELEASE Bilateral after 2005  . CATARACT EXTRACTION W/ INTRAOCULAR LENS  IMPLANT, BILATERAL Bilateral 1990's  . COLONOSCOPY  08/29/2011   Procedure: COLONOSCOPY;  Surgeon: Jerene Bears, MD;  Location: [place];  Service: Gastroenterology;  Laterality: N/A;  . COLONOSCOPY WITH PROPOFOL N/A 04/26/2016   Procedure: COLONOSCOPY WITH PROPOFOL;  Surgeon: Jerene Bears, MD;  Location: [facility] ENDOSCOPY;  Service: Gastroenterology;  Laterality: N/A;  .  CORONARY ANGIOPLASTY WITH STENT PLACEMENT  04/09/2014       ptca/des mid 62   . ESOPHAGOGASTRODUODENOSCOPY N/A 12/25/2015   Procedure: ESOPHAGOGASTRODUODENOSCOPY (EGD);  Surgeon: Mauri Pole, MD;  Location: [facility] ENDOSCOPY;  Service: Endoscopy;  Laterality: N/A;  bedside  . ESOPHAGOGASTRODUODENOSCOPY (EGD) WITH PROPOFOL N/A 04/26/2016   Procedure: ESOPHAGOGASTRODUODENOSCOPY (EGD) WITH PROPOFOL;  Surgeon: Jerene Bears, MD;  Location: [facility] ENDOSCOPY;  Service: Gastroenterology;  Laterality: N/A;  . EYE SURGERY Bilateral    cataract  . FEMORAL ARTERY REPAIR  04/09/2014   ANGIOSEAL  . INSERTION OF DIALYSIS CATHETER  08/01/2011   Procedure: INSERTION OF DIALYSIS CATHETER;  Surgeon: Rosetta Posner, MD;  Location: [place];  Service: Vascular;  Laterality: Right;  insertion of dialysis catheter on right internal jugular vein  . INSERTION OF DIALYSIS CATHETER Right 01/20/2015   Procedure: INSERTION OF DIALYSIS CATHETER - RIGHT INTERNAL JUGULAR ;  Surgeon: Angelia Mould, MD;  Location: [place];  Service: Vascular;  Laterality: Right;  . INSERTION OF DIALYSIS CATHETER Right 08/02/2018   Procedure: Insertion Of Dialysis Catheter;  Surgeon: Angelia Mould, MD;  Location: [facility] OR;  Service: Vascular;  Laterality: Right;  . KIDNEY TRANSPLANT  04/25/1993  . LEFT HEART CATHETERIZATION WITH CORONARY ANGIOGRAM N/A 03/19/2014   Procedure: LEFT HEART CATHETERIZATION WITH CORONARY  ANGIOGRAM;  Surgeon: Burnell Blanks, MD;  Location: [facility] CATH LAB;  Service: Cardiovascular;  Laterality: N/A;  . LIGATION OF ARTERIOVENOUS  FISTULA Left 11/01/2018   Procedure: Ligation Of  Brachio Cephalic Arteriovenous  Fistula;  Surgeon: Serafina Mitchell, MD;  Location: [facility] OR;  Service: Vascular;  Laterality: Left;  . LYMPH NODE BIOPSY  08/10/2011   Procedure: LYMPH NODE BIOPSY;  Surgeon: Merrie Roof, MD;  Location: [place];  Service: General;  Laterality: Left;  left groin lymph biopsy  . NEUROPLASTY / TRANSPOSITION ULNAR NERVE AT ELBOW Left after 2005  . PATCH ANGIOPLASTY Left 08/09/2016   Procedure: LEFT  ARTERIOVENOUS FISTULA PATCH ANGIOPLASTY;  Surgeon: Waynetta Sandy, MD;  Location: [name];  Service: Vascular;  Laterality: Left;  . PERCUTANEOUS CORONARY ROTOBLATOR INTERVENTION (PCI-R) N/A 04/09/2014   Procedure: PERCUTANEOUS CORONARY ROTOBLATOR INTERVENTION (PCI-R);  Surgeon: Burnell Blanks, MD;  Location: [facility] CATH LAB;  Service: Cardiovascular;  Laterality: N/A;  . PERIPHERAL VASCULAR BALLOON ANGIOPLASTY  07/11/2017   Procedure: PERIPHERAL VASCULAR BALLOON ANGIOPLASTY;  Surgeon: Waynetta Sandy, MD;  Location: [place] [facility];  Service: Cardiovascular;;  left arm AV fistula  . PERIPHERAL VASCULAR CATHETERIZATION N/A 12/25/2014   Procedure: Upper Extremity Venography;  Surgeon: Conrad Hayden Lake, MD;  Location: [place] [facility];  Service: Cardiovascular;  Laterality: N/A;  . PERIPHERAL VASCULAR CATHETERIZATION Left 02/24/2016   Procedure: Fistulagram;  Surgeon: Waynetta Sandy, MD;  Location: [place] [facility];  Service: Cardiovascular;  Laterality: Left;  . PERIPHERAL VASCULAR CATHETERIZATION Left 02/24/2016   Procedure: Peripheral Vascular Intervention;  Surgeon: Waynetta Sandy, MD;  Location: [place] [facility];  Service: Cardiovascular;  Laterality: Left;  AV FISTULA  . PERITONEAL CATHETER INSERTION    . PERITONEAL CATHETER REMOVAL    .  REVISON OF ARTERIOVENOUS FISTULA Right 1/69/4503   Procedure: PLICATION / REVISION OF ARTERIOVENOUS FISTULA;  Surgeon: Angelia Mould, MD;  Location: [place];  Service: Vascular;  Laterality: Right;  . REVISON OF ARTERIOVENOUS FISTULA Left 09/01/2015   Procedure: SUPERFICIALIZATION OF LEFT ARM BRACHIOCEPHALIC ARTERIOVENOUS FISTULA;  Surgeon: Angelia Mould, MD;  Location: [place];  Service: Vascular;  Laterality: Left;  . REVISON OF ARTERIOVENOUS FISTULA Left 08/09/2016   Procedure: REVISION OF ARTERIAL ANASTOMOSIS OF ARTERIOVENOUS FISTULA;  Surgeon: Waynetta Sandy, MD;  Location: [place];  Service: Vascular;  Laterality: Left;  . SHOULDER ARTHROSCOPY Left ~ 2006   frozen  . THROMBECTOMY W/ EMBOLECTOMY Left 08/02/2018   Procedure: THROMBECTOMY ARTERIOVENOUS FISTULA;  Surgeon: Angelia Mould, MD;  Location: [place];  Service: Vascular;  Laterality: Left;  . TOE AMPUTATION Bilateral after 2005  . UMBILICAL HERNIA REPAIR  1990's  . UPPER EXTREMITY VENOGRAPHY Bilateral 10/16/2018   Procedure: UPPER EXTREMITY VENOGRAPHY;  Surgeon: Serafina Mitchell, MD;  Location: [place] [facility];  Service: Cardiovascular;  Laterality: Bilateral;  . VENOGRAM Right 07/28/2011   Procedure: VENOGRAM;  Surgeon: Conrad Laconia, MD;  Location: [facility] CATH LAB;  Service: Cardiovascular;  Laterality: Right;  . VITRECTOMY Bilateral     Allergies  Allergen Reactions  . Iodinated Diagnostic Agents Itching, Rash and Other (See Comments)    Systemic itching and transient rash appearance within hours of receiving contrast    . Prednisone Other (See Comments)    Increases sugar levels  . Adhesive [Tape] Itching and Other (See Comments)    Blisters on arm from adhesive tape at dialysis   . Amoxicillin Itching, Rash and Other (See Comments)    Did it involve swelling of the face/tongue/throat, SOB, or low BP? No Did it involve sudden or severe rash/hives, skin peeling, or any reaction on the inside of your mouth  or nose? No Did you need to seek medical attention at a hospital or doctor's office? No When did it last happen?10+ years If all above answers are "NO", may proceed with cephalosporin use.   . Clindamycin/Lincomycin Hives and Itching  . Enalapril Rash and Cough    Cough  . Mobic [Meloxicam] Hives and Rash  . Morphine Hives, Nausea Only and Rash  . Brilinta [Ticagrelor] Nausea And Vomiting  . Ciprofloxacin Itching and Rash  . Clopidogrel Rash  . Codeine Hives, Itching and Rash  . Doxycycline Rash  . Hydrocodone Rash  . Levofloxacin Hives and Itching  . Oxycodone Rash  . Rocephin [Ceftriaxone] Itching    Current Outpatient Medications  Medication Sig Dispense Refill  . acetaminophen (TYLENOL) 500 MG tablet Take 1,000 mg by mouth See admin instructions. Take 1000 mg at bedtime, may take an additional 1000 mg dose as needed for pain    . allopurinol (ZYLOPRIM) 100 MG tablet Take 2 tablets (200 mg total) by mouth daily. 180 tablet 3  . aspirin EC 81 MG tablet Take 81 mg by mouth at bedtime.    [name] Kitchen atorvastatin (LIPITOR) 10 MG tablet Take 10 mg by mouth daily.     . B Complex-C-Folic Acid (DIALYVITE 330) 0.8 MG TABS Take 1 tablet by mouth daily.    . calcium acetate (PHOSLO) 667 MG capsule Take 667-1,334 mg by mouth See admin instructions. Take 1334 mg with each meal and 667 mg with each snack    . cetirizine (ZYRTEC) 10 MG tablet Take 10 mg by mouth daily.    . diphenhydrAMINE (BENADRYL) 25 MG tablet Take 50 mg by mouth at bedtime.     . docusate sodium (COLACE) 100 MG capsule Take 200 mg by mouth 2 (two) times daily.     . famotidine (PEPCID) 20 MG tablet Take 20 mg by mouth daily as needed for heartburn or indigestion.     . fluticasone (FLONASE) 50 MCG/ACT  nasal spray Place 2 sprays into both nostrils daily as needed for allergies.     [name] Kitchen gabapentin (NEURONTIN) 300 MG capsule Take 300 mg by mouth at bedtime.    [name] Kitchen GLUCAGON EMERGENCY 1 MG injection Inject 1 mg into the muscle daily  as needed (low blood sugar).     . Glucose-Cholecalciferol (RELION GLUCOSE SHOT) LIQD Take 1 Dose by mouth daily as needed (low blood sugar).    . hydrocortisone 2.5 % cream Apply 1 application topically 2 (two) times daily as needed (rash / itching).    [name] Kitchen HYDROmorphone (DILAUDID) 2 MG tablet Take 2 mg by mouth 2 (two) times daily as needed for moderate pain or severe pain.     . hydrOXYzine (ATARAX/VISTARIL) 10 MG tablet Take 10 mg by mouth 3 (three) times daily as needed for itching.    . insulin lispro (HUMALOG) 100 UNIT/ML injection Inject 75-120 Units into the skin continuous. Per insulin pump    . levothyroxine (SYNTHROID, LEVOTHROID) 112 MCG tablet Take 112 mcg by mouth at bedtime.     [name] Kitchen loperamide (IMODIUM A-D) 2 MG tablet Take 2-4 mg by mouth 4 (four) times daily as needed for diarrhea or loose stools.    . Melatonin 5 MG TABS Take 5 mg by mouth at bedtime.    . metoCLOPramide (REGLAN) 10 MG tablet Take 10 mg by mouth 4 (four) times daily -  before meals and at bedtime.     . midodrine (PROAMATINE) 10 MG tablet Take 1 tablet (10 mg total) by mouth 3 (three) times daily. Takes 1 tab in mornings of dialysis and takes 2 tabs 30 mins before starting dialysis (m,w,f) (Patient taking differently: Take 10 mg by mouth See admin instructions. Take 10 mg 3 times daily on Sun, Tue, Thur, and Sat.  Take 10 mg before dialysis and 20 mg during dialysis treatment on Mon, Wed, and Fri.)    . nitroGLYCERIN (NITROSTAT) 0.4 MG SL tablet Place 1 tablet (0.4 mg total) under the tongue every 5 (five) minutes as needed for chest pain. 25 tablet 4  . Nutritional Supplements (FEEDING SUPPLEMENT, NEPRO CARB STEADY,) LIQD Take 237 mLs by mouth daily. (Patient taking differently: Take 237 mLs by mouth 2 (two) times a week. ) 30 Can 0  . nystatin (NYSTATIN) powder Apply 1 g topically daily as needed (yeast).    Vladimir Faster Glycol-Propyl Glycol (SYSTANE ULTRA PF OP) Place 1 drop into both eyes every 2 (two) hours as needed  (dry eyes).    Vladimir Faster Glycol-Propyl Glycol (SYSTANE) 0.4-0.3 % SOLN Place 1 drop into both eyes 4 (four) times daily.     . polyethylene glycol (MIRALAX / GLYCOLAX) packet Take 17 g by mouth daily. (Patient taking differently: Take 17 g by mouth daily as needed for mild constipation. ) 14 each 0  . prasugrel (EFFIENT) 10 MG TABS tablet Take 1 tablet (10 mg total) by mouth daily. Please keep upcoming appt in October before anymore refills. Thank you 90 tablet 0  . promethazine (PHENERGAN) 25 MG tablet Take 25 mg by mouth every 6 (six) hours as needed for nausea or vomiting.    [name] Kitchen testosterone cypionate (DEPOTESTOSTERONE CYPIONATE) 200 MG/ML injection Inject 200 mg into the muscle every 14 (fourteen) days.   1  . VELPHORO 500 MG chewable tablet Chew 500-1,000 mg by mouth See admin instructions. Chew 1,000 mg by mouth three times a day with meals and 500 mg twice daily with snacks  10  . rOPINIRole (  REQUIP) 2 MG tablet Take 1 tablet (2 mg total) by mouth at bedtime. Take 2 mg by mouth every night at bedtime (Patient taking differently: Take 2 mg by mouth daily with supper. ) 30 tablet 0   No current facility-administered medications for this visit.      REVIEW OF SYSTEMS: see HPI for pertinent positives and negatives    PHYSICAL EXAMINATION:  Vitals:   01/30/19 1556  BP: (!) 142/65  Pulse: 78  Resp: 20  Temp: (!) 97.4 F (36.3 C)  SpO2: 94%  Weight: 280 lb (127 kg)  Height: _0  (1.702 m)   Body mass index is 43.85 kg/m.  General: Morbidly obese male, seated in w/c, accompanied by his wife   HEENT:  No gross abnormalities, large neck Pulmonary: Respirations are non-labored Abdomen: Softly distended and non-tender Musculoskeletal: Left lower extremity prosthesis in place  Neurologic: No focal weakness or paresthesias are detected, right hand grip strength is 5/5, left is 4/5 Skin: There are no ulcer or rashes noted. Psychiatric: The patient has normal affect. Cardiovascular:  There is a regular rate and rhythm without significant murmur appreciated.  Brisk thrill and bruit at left upper arm AVF. Left radial and ulnar pulses are not palpable, right radial pulse is 1+ palpable.  Right IJ TDC noted.    Left upper arm AVF incision is completely healed except for small scab     DATA  Steal Duplex Left Arm (01-30-19); Findings:   +------------------+-----------------+-------------------+--------+                   PPG w compressionPPG w/o compressionComments +------------------+-----------------+-------------------+--------+                   PSV w compressionPSV w/o compression         +------------------+-----------------+-------------------+--------+ Left Radial Artery       62                51                  +------------------+-----------------+-------------------+--------+  Summary: Left second digit pressure increased from 83 mm Hg under ambient conditions to 121 mm Hg with AVF compression. Imaging of the left subclavian vein was attempted. The left subclavian artery was identified. The left subclavian vein could not be identified proximally. The left distal subclavian vein / axillary vein demonstrated a continuous signal consistent with proximal obstruction.    Steal Duplex Left Arm (01-14-19); Findings: Summary: The right radial PSV has increased with compression. Left second digit pressure increased from 48 mm HG to 70 mm HG with AVF compression. Left radial artery peak systolic velocity increased from 88.6 cm/sec to 125 cm/sec with AVF compression. Limited imaging showed a patent brachio/ basilic AVF and a calcified ulnar artery with very little flow distally. Findings suggest distal ulnar artery stenosis or occlusion. Patent left brachio-basilic AVF.   Medical Decision Making  Jovanni Rash is a 53 y.o. male who is s/p left second stage basilic vein fistula creation on 12-27-18 by Dr. Trula Slade. Prior to the above  pt was s/p ligation of left brachiocephalic fistula, left first stage basilic vein fistula creation, and redo exposure of left brachial arteryon 11-01-18 by Dr. Trula Slade. His right central venous system is occluded, and today's steal duplex of left arm indicates that the left distal subclavian vein / axillary vein demonstrated a continuous signal consistent with proximal obstruction.   Pt reports pain, tingling and numbness in his left 3rd and  4th fingers, and that this seems to be worsening. He also reports numbness at the medial aspect of his left lower arm, states he afraid that he might injure his left forearm since he has no sensation at the medial aspect.   All staples removed from his left upper arm AVF incision on 01-14-19, incision is completely healed except for  small scab.   Dr. Oneida Alar spoke with pt and wife and examined pt. If the left arm AVF was ligated to Improve perfusion to hs left hand, then his only remaining option for an access is his left thigh.  Pt states he will try to keep this AVF, will return in 6 weeks with AVF duplex, and see Dr. Oneida Alar. He knows to notify us if he is unable to tolerate the steal symptoms in his left upper extremity, and if he wants the AVF ligated.     Clemon Chambers, RN, MSN, FNP-C [facility] Office: [phone]  01/30/2019, 4:03 PM  Clinic MD: Oneida Alar

## 2019-01-31 NOTE — Progress Notes (Signed)
Virtual Visit via Video Note   This visit type was conducted due to national recommendations for restrictions regarding the COVID-19 Pandemic (e.g. social distancing) in an effort to limit this patient's exposure and mitigate transmission in our community.  Due to his co-morbid illnesses, this patient is at least at moderate risk for complications without adequate follow up.  This format is felt to be most appropriate for this patient at this time.  All issues noted in this document were discussed and addressed.  A limited physical exam was performed with this format.  Please refer to the patient's chart for his consent to telehealth for [facility].   Date:  02/05/2019   ID:  Dale [name], DOB 09-Nov-1965, MRN [id number]  Patient Location: Home Provider Location: Home  PCP:  Bonnita Hollow, MD  Cardiologist:  Lauree Chandler, MD  Electrophysiologist:  None   Evaluation Performed:  Follow-Up Visit  Chief Complaint:  F/U  History of Present Illness:    Dale [name] is a 53 y.o. male with history of CAD DES/rotational atherectomy/PTCA dRCA 2015, possible allergic reaction to Plavix but did well with effient.NSTEMI 12/2015 DES Dx, nausea with brilinta and changed to effient.Has had chest and back pain when BP is low, has gotten midodrine on dialysis days. Echo 10/2016 LVEF 60-65% grade 1 DD. Also has DM, HTN, ESRD on HD, PAD left BKA, morbid obesity, OSA.  LOV Dr. [name] 07/06/17 no angina and no changes made.  Was having low BP Nov-June and was having to use midodrine all the time but now only using periodically-mostly before dialysis. No chest pain.Some dyspnea when he exerts himself. Not active because he's wheelchair bound. Does arm exercises to keep strong enough to transfer.  The patient does not have symptoms concerning for COVID-19 infection (fever, chills, cough, or new shortness of breath).    Past Medical History:  Diagnosis Date   Anal fissure    Anemia  of chronic disease    Arthritis    "hands, right knee" (03/19/2014) no cartlidge in right knee   CAD (coronary artery disease)    a. Abnl nuc ->LHC (11/15):  mid to dist Dx 80%, mid RCA 99% (functional CTO), dist RCA 80% >> PCI: balloon angioplasty to mid RCA (could not deliver stent) - staged PCI Rotoblator atherectomy/DES to [facility]. b. NSTEMI 12/2015 s/p DES to diagonal.   Cholecystitis    a. 08/27/2011   Chronic diastolic CHF (congestive heart failure) ([place])    a. Echo 3/13:  EF 55-60%;  b. Echo (11/15):  Mild LVH, EF 60-65%, no RWMA, Gr 1 DD, MAC, mild LAE, normal RVF, mild RAE;  c.  Echo 5/16:  severe LVH, EF 55-60%, no RWMA, Gr 1 DD, MAC, mild LAE   Claustrophobia    when things get around his face.    Complication of anesthesia    Difficult intubation    only once in 2015 at [facility] haven't had a problem since then   Diverticulosis    Esophageal dysmotility    ESRD (end stage renal disease) ([place])    a. 1995 s/p cadaveric transplant w/ susbequent failure after 18 yrs;  b.Dialysis initiated 07/2011 [place] M-W-F   Fibromyalgia    Gastroparesis    GERD (gastroesophageal reflux disease)    Gout    PMH   History of blood transfusion    HLD (hyperlipidemia)    Hypertension    Hypothyroidism    Inguinal lymphadenopathy  a. bilateral - s/p biopsy 07/2011   Insulin dependent diabetes mellitus    Type I   Monilial esophagitis (HCC)    Morbid obesity (HCC)    OSA (obstructive sleep apnea)    adjustable bed   PAD (peripheral artery disease) ([place])    a. s/p L BKA.   Pericardial effusion    a.  Small by CT 08/22/11;  b.  Large by CT 08/27/11. c. Not seen on 11/2015 echo.   Pneumonia ~ 2007   PONV (postoperative nausea and vomiting)    vomited once after a procedure.   T1DM (type 1 diabetes mellitus) ([place])    Tracheobronchomalacia    Past Surgical History:  Procedure Laterality Date   A/V FISTULAGRAM Left 07/26/2016   Procedure: A/V Fistulagram;   Surgeon: Waynetta Sandy, MD;  Location: [place] CV LAB;  Service: Cardiovascular;  Laterality: Left;   A/V FISTULAGRAM N/A 07/11/2017   Procedure: A/V FISTULAGRAM - Left AV;  Surgeon: Waynetta Sandy, MD;  Location: [place] CV LAB;  Service: Cardiovascular;  Laterality: N/A;   AMPUTATION OF REPLICATED TOES Right    ANGIOPLASTY  04/09/2014   RCA    APPENDECTOMY  ~ 2006   ARTERIOVENOUS GRAFT PLACEMENT Left 1993?   forearm   ARTERIOVENOUS GRAFT PLACEMENT Right 1993?   leg   ARTERIOVENOUS GRAFT PLACEMENT Left    Thigh   Arteriovenous Graft Removed Left    Thigh   AV FISTULA PLACEMENT  07/29/2011   Procedure: ARTERIOVENOUS (AV) FISTULA CREATION;  Surgeon: Mal Misty, MD;  Location: [place];  Service: Vascular;  Laterality: Right;  Brachial cephalic   AV FISTULA PLACEMENT Left 01/20/2015   Procedure: LIGATION OF RIGHT ARTERIOVENOUS FISTULA WITH EXCISION OF ANEURYSM;  Surgeon: Angelia Mould, MD;  Location: [place];  Service: Vascular;  Laterality: Left;   AV FISTULA PLACEMENT Left 04/30/2015   Procedure: CREATION OF LEFT ARM BRACHIO-CEPHALIC ARTERIOVENOUS (AV) FISTULA ;  Surgeon: Angelia Mould, MD;  Location: [place];  Service: Vascular;  Laterality: Left;   AV Graft PLACEMENT Left 1993?   "attempted one in my wrist; didn't take"   [place] Left 11/01/2018   Procedure: FIRST STAGE BASILIC VEIN TRANSPOSITION  LEFT ARM;  Surgeon: Serafina Mitchell, MD;  Location: [place];  Service: Vascular;  Laterality: Left;   [place] Left 12/27/2018   Procedure: SECOND STAGE BASILIC VEIN TRANSPOSITION  LEFT ARM;  Surgeon: Serafina Mitchell, MD;  Location: [place];  Service: Vascular;  Laterality: Left;   BELOW KNEE LEG AMPUTATION Left 2010   CARDIAC CATHETERIZATION  03/19/2014   CARDIAC CATHETERIZATION  03/19/2014   Procedure: CORONARY BALLOON ANGIOPLASTY;  Surgeon: Burnell Blanks, MD;  Location: [facility] CATH LAB;  Service:  Cardiovascular;;   CARDIAC CATHETERIZATION N/A 01/01/2016   Procedure: Left Heart Cath and Coronary Angiography;  Surgeon: Troy Sine, MD;  Location: [place] CV LAB;  Service: Cardiovascular;  Laterality: N/A;   CARDIAC CATHETERIZATION N/A 01/01/2016   Procedure: Coronary Stent Intervention;  Surgeon: Troy Sine, MD;  Location: [place] CV LAB;  Service: Cardiovascular;  Laterality: N/A;   CARPAL TUNNEL RELEASE Bilateral after 2005   CATARACT EXTRACTION W/ INTRAOCULAR LENS  IMPLANT, BILATERAL Bilateral 1990's   COLONOSCOPY  08/29/2011   Procedure: COLONOSCOPY;  Surgeon: Jerene Bears, MD;  Location: [place];  Service: Gastroenterology;  Laterality: N/A;   COLONOSCOPY WITH PROPOFOL N/A 04/26/2016   Procedure: COLONOSCOPY WITH PROPOFOL;  Surgeon: Lajuan Lines  Pyrtle, MD;  Location: [facility] ENDOSCOPY;  Service: Gastroenterology;  Laterality: N/A;   CORONARY ANGIOPLASTY WITH STENT PLACEMENT  04/09/2014       ptca/des mid lad    ESOPHAGOGASTRODUODENOSCOPY N/A 12/25/2015   Procedure: ESOPHAGOGASTRODUODENOSCOPY (EGD);  Surgeon: Mauri Pole, MD;  Location: [facility] ENDOSCOPY;  Service: Endoscopy;  Laterality: N/A;  bedside   ESOPHAGOGASTRODUODENOSCOPY (EGD) WITH PROPOFOL N/A 04/26/2016   Procedure: ESOPHAGOGASTRODUODENOSCOPY (EGD) WITH PROPOFOL;  Surgeon: Jerene Bears, MD;  Location: [facility] ENDOSCOPY;  Service: Gastroenterology;  Laterality: N/A;   EYE SURGERY Bilateral    cataract   FEMORAL ARTERY REPAIR  04/09/2014   ANGIOSEAL   INSERTION OF DIALYSIS CATHETER  08/01/2011   Procedure: INSERTION OF DIALYSIS CATHETER;  Surgeon: Rosetta Posner, MD;  Location: [place];  Service: Vascular;  Laterality: Right;  insertion of dialysis catheter on right internal jugular vein   INSERTION OF DIALYSIS CATHETER Right 01/20/2015   Procedure: INSERTION OF DIALYSIS CATHETER - RIGHT INTERNAL JUGULAR ;  Surgeon: Angelia Mould, MD;  Location: [place];  Service: Vascular;  Laterality: Right;   INSERTION OF  DIALYSIS CATHETER Right 08/02/2018   Procedure: Insertion Of Dialysis Catheter;  Surgeon: Angelia Mould, MD;  Location: [place];  Service: Vascular;  Laterality: Right;   KIDNEY TRANSPLANT  04/25/1993   LEFT HEART CATHETERIZATION WITH CORONARY ANGIOGRAM N/A 03/19/2014   Procedure: LEFT HEART CATHETERIZATION WITH CORONARY ANGIOGRAM;  Surgeon: Burnell Blanks, MD;  Location: [facility] CATH LAB;  Service: Cardiovascular;  Laterality: N/A;   LIGATION OF ARTERIOVENOUS  FISTULA Left 11/01/2018   Procedure: Ligation Of  Brachio Cephalic Arteriovenous  Fistula;  Surgeon: Serafina Mitchell, MD;  Location: [place];  Service: Vascular;  Laterality: Left;   LYMPH NODE BIOPSY  08/10/2011   Procedure: LYMPH NODE BIOPSY;  Surgeon: Merrie Roof, MD;  Location: [place];  Service: General;  Laterality: Left;  left groin lymph biopsy   NEUROPLASTY / TRANSPOSITION ULNAR NERVE AT ELBOW Left after 2005   PATCH ANGIOPLASTY Left 08/09/2016   Procedure: LEFT  ARTERIOVENOUS FISTULA PATCH ANGIOPLASTY;  Surgeon: Waynetta Sandy, MD;  Location: [place];  Service: Vascular;  Laterality: Left;   PERCUTANEOUS CORONARY ROTOBLATOR INTERVENTION (PCI-R) N/A 04/09/2014   Procedure: PERCUTANEOUS CORONARY ROTOBLATOR INTERVENTION (PCI-R);  Surgeon: Burnell Blanks, MD;  Location: [facility] CATH LAB;  Service: Cardiovascular;  Laterality: N/A;   PERIPHERAL VASCULAR BALLOON ANGIOPLASTY  07/11/2017   Procedure: PERIPHERAL VASCULAR BALLOON ANGIOPLASTY;  Surgeon: Waynetta Sandy, MD;  Location: [place] CV LAB;  Service: Cardiovascular;;  left arm AV fistula   PERIPHERAL VASCULAR CATHETERIZATION N/A 12/25/2014   Procedure: Upper Extremity Venography;  Surgeon: Conrad [place], MD;  Location: [place] CV LAB;  Service: Cardiovascular;  Laterality: N/A;   PERIPHERAL VASCULAR CATHETERIZATION Left 02/24/2016   Procedure: Fistulagram;  Surgeon: Waynetta Sandy, MD;  Location: [place] CV LAB;  Service:  Cardiovascular;  Laterality: Left;   PERIPHERAL VASCULAR CATHETERIZATION Left 02/24/2016   Procedure: Peripheral Vascular Intervention;  Surgeon: Waynetta Sandy, MD;  Location: [place] CV LAB;  Service: Cardiovascular;  Laterality: Left;  AV FISTULA   PERITONEAL CATHETER INSERTION     PERITONEAL CATHETER REMOVAL     REVISON OF ARTERIOVENOUS FISTULA Right 0/53/9767   Procedure: PLICATION / REVISION OF ARTERIOVENOUS FISTULA;  Surgeon: Angelia Mould, MD;  Location: [place];  Service: Vascular;  Laterality: Right;   REVISON OF ARTERIOVENOUS FISTULA Left 09/01/2015   Procedure: SUPERFICIALIZATION OF LEFT ARM  BRACHIOCEPHALIC ARTERIOVENOUS FISTULA;  Surgeon: Angelia Mould, MD;  Location: [place];  Service: Vascular;  Laterality: Left;   REVISON OF ARTERIOVENOUS FISTULA Left 08/09/2016   Procedure: REVISION OF ARTERIAL ANASTOMOSIS OF ARTERIOVENOUS FISTULA;  Surgeon: Waynetta Sandy, MD;  Location: [place];  Service: Vascular;  Laterality: Left;   SHOULDER ARTHROSCOPY Left ~ 2006   frozen   THROMBECTOMY W/ EMBOLECTOMY Left 08/02/2018   Procedure: THROMBECTOMY ARTERIOVENOUS FISTULA;  Surgeon: Angelia Mould, MD;  Location: [place];  Service: Vascular;  Laterality: Left;   TOE AMPUTATION Bilateral after 0630   UMBILICAL HERNIA REPAIR  1990's   UPPER EXTREMITY VENOGRAPHY Bilateral 10/16/2018   Procedure: UPPER EXTREMITY VENOGRAPHY;  Surgeon: Serafina Mitchell, MD;  Location: [place] CV LAB;  Service: Cardiovascular;  Laterality: Bilateral;   VENOGRAM Right 07/28/2011   Procedure: VENOGRAM;  Surgeon: Conrad [place], MD;  Location: [facility] CATH LAB;  Service: Cardiovascular;  Laterality: Right;   VITRECTOMY Bilateral      Current Meds  Medication Sig   acetaminophen (TYLENOL) 500 MG tablet Take 1,000 mg by mouth See admin instructions. Take 1000 mg at bedtime, may take an additional 1000 mg dose as needed for pain   allopurinol (ZYLOPRIM) 100 MG tablet Take 2  tablets (200 mg total) by mouth daily.   aspirin EC 81 MG tablet Take 81 mg by mouth at bedtime.   atorvastatin (LIPITOR) 10 MG tablet Take 10 mg by mouth daily.    B Complex-C-Folic Acid (DIALYVITE 160) 0.8 MG TABS Take 1 tablet by mouth daily.   calcium acetate (PHOSLO) 667 MG capsule Take 667-1,334 mg by mouth See admin instructions. Take 1334 mg with each meal and 667 mg with each snack   cetirizine (ZYRTEC) 10 MG tablet Take 10 mg by mouth daily.   diphenhydrAMINE (BENADRYL) 25 MG tablet Take 50 mg by mouth at bedtime.    docusate sodium (COLACE) 100 MG capsule Take 200 mg by mouth 2 (two) times daily.    famotidine (PEPCID) 20 MG tablet Take 20 mg by mouth daily as needed for heartburn or indigestion.    fluticasone (FLONASE) 50 MCG/ACT nasal spray Place 2 sprays into both nostrils daily as needed for allergies.    gabapentin (NEURONTIN) 300 MG capsule Take 300 mg by mouth at bedtime.   GLUCAGON EMERGENCY 1 MG injection Inject 1 mg into the muscle daily as needed (low blood sugar).    Glucose-Cholecalciferol (RELION GLUCOSE SHOT) LIQD Take 1 Dose by mouth daily as needed (low blood sugar).   hydrocortisone 2.5 % cream Apply 1 application topically 2 (two) times daily as needed (rash / itching).   HYDROmorphone (DILAUDID) 2 MG tablet Take 2 mg by mouth 2 (two) times daily as needed for moderate pain or severe pain.    hydrOXYzine (ATARAX/VISTARIL) 10 MG tablet Take 10 mg by mouth 3 (three) times daily as needed for itching.   insulin lispro (HUMALOG) 100 UNIT/ML injection Inject 75-120 Units into the skin continuous. Per insulin pump   levothyroxine (SYNTHROID, LEVOTHROID) 112 MCG tablet Take 112 mcg by mouth at bedtime.    loperamide (IMODIUM A-D) 2 MG tablet Take 2-4 mg by mouth 4 (four) times daily as needed for diarrhea or loose stools.   Melatonin 5 MG TABS Take 5 mg by mouth at bedtime.   metoCLOPramide (REGLAN) 10 MG tablet Take 10 mg by mouth 4 (four) times daily  -  before meals and at bedtime.    midodrine (PROAMATINE) 10  MG tablet Take 1 tablet (10 mg total) by mouth 3 (three) times daily. Takes 1 tab in mornings of dialysis and takes 2 tabs 30 mins before starting dialysis (m,w,f) (Patient taking differently: Take 10 mg by mouth See admin instructions. Take 10 mg 3 times daily on Sun, Tue, Thur, and Sat.  Take 10 mg before dialysis and 20 mg during dialysis treatment on Mon, Wed, and Fri.)   nitroGLYCERIN (NITROSTAT) 0.4 MG SL tablet Place 1 tablet (0.4 mg total) under the tongue every 5 (five) minutes as needed for chest pain.   Nutritional Supplements (FEEDING SUPPLEMENT, NEPRO CARB STEADY,) LIQD Take 237 mLs by mouth daily. (Patient taking differently: Take 237 mLs by mouth 2 (two) times a week. )   nystatin (NYSTATIN) powder Apply 1 g topically daily as needed (yeast).   Polyethyl Glycol-Propyl Glycol (SYSTANE ULTRA PF OP) Place 1 drop into both eyes every 2 (two) hours as needed (dry eyes).   Polyethyl Glycol-Propyl Glycol (SYSTANE) 0.4-0.3 % SOLN Place 1 drop into both eyes 4 (four) times daily.    polyethylene glycol (MIRALAX / GLYCOLAX) packet Take 17 g by mouth daily. (Patient taking differently: Take 17 g by mouth daily as needed for mild constipation. )   prasugrel (EFFIENT) 10 MG TABS tablet Take 1 tablet (10 mg total) by mouth daily. Please keep upcoming appt in October before anymore refills. Thank you   promethazine (PHENERGAN) 25 MG tablet Take 25 mg by mouth every 6 (six) hours as needed for nausea or vomiting.   rOPINIRole (REQUIP) 2 MG tablet Take 1 tablet (2 mg total) by mouth at bedtime. Take 2 mg by mouth every night at bedtime (Patient taking differently: Take 2 mg by mouth daily with supper. )   testosterone cypionate (DEPOTESTOSTERONE CYPIONATE) 200 MG/ML injection Inject 200 mg into the muscle every 14 (fourteen) days.    VELPHORO 500 MG chewable tablet Chew 500-1,000 mg by mouth See admin instructions. Chew 1,000 mg by  mouth three times a day with meals and 500 mg twice daily with snacks     Allergies:   Iodinated diagnostic agents, Prednisone, Adhesive [tape], Amoxicillin, Clindamycin/lincomycin, Enalapril, Mobic [meloxicam], Morphine, Brilinta [ticagrelor], Ciprofloxacin, Clopidogrel, Codeine, Doxycycline, Hydrocodone, Levofloxacin, Oxycodone, and Rocephin [ceftriaxone]   Social History   Tobacco Use   Smoking status: Never Smoker   Smokeless tobacco: Never Used  Substance Use Topics   Alcohol use: Yes    Comment: occasional   Drug use: No     Family Hx: [name] family history includes Bone cancer in his maternal aunt; Breast cancer in his maternal aunt; Colon polyps in his father; Diabetes in his father and mother; Hyperlipidemia in his mother; Hypertension in his father and mother; Liver cancer in his maternal aunt. There is no history of Malignant hyperthermia, Heart attack, Stroke, Colon cancer, Stomach cancer, Pancreatic cancer, or Esophageal cancer.  ROS:   Please see the history of present illness.      All other systems reviewed and are negative.   Prior CV studies:   The following studies were reviewed today:   Echo July 2018: Left ventricle: The cavity size was normal. Wall thickness was   increased in a pattern of moderate LVH. Systolic function was   normal. The estimated ejection fraction was in the range of 60%   to 65%. Wall motion was normal; there were no regional wall   motion abnormalities. Doppler parameters are consistent with   abnormal left ventricular relaxation (grade 1 diastolic  dysfunction). The E/e&' ratio is >15, suggesting elevated LV   filling pressure. - Mitral valve: Calcified annulus. Mildly thickened leaflets .   There was trivial regurgitation. - Left atrium: The atrium was normal in size. - Tricuspid valve: There was mild regurgitation. - Pulmonary arteries: PA peak pressure: 39 mm Hg (S). - Inferior vena cava: The vessel was normal in  size. The   respirophasic diameter changes were in the normal range (>= 50%),   consistent with normal central venous pressure.   Impressions:   - LVEF 60-65%, moderate LVH, normal wall motion, grade 1 DD with   elevated LV filling pressure, trivial MR, mild TR, RVSP 39 mmHg,   normal IVC.   Cardiac cath Sept 2017:  Conclusion 01/01/16    Ost LAD lesion, 30 %stenosed.  Dist LAD lesion, 30 %stenosed.  Prox RCA to Mid RCA lesion, 0 %stenosed.  1st RPLB lesion, 50 %stenosed.  A STENT XIENCE ALPINE RX 1.24P80 drug eluting stent was successfully placed.  1st Diag lesion, 95 %stenosed.  Post intervention, there is a 0% residual stenosis.  Dist RCA lesion, 30 %stenosed.   Two-vessel coronary obstructive disease with mild calcification of the proximal LAD with 30% narrowing, progressive 95% mid diagonal stenosis, 30% mid LAD narrowing; normal left circumflex coronary artery; and calcified large RCA with a widely patent stent in the mid RCA at the site of previous atherectomy, 30% acute margin narrowing, and 50% inferior branch PDA stenosis.   Successful PCI to the diagonal vessel with ultimate insertion of a 2.2515 mm Xience Alpine DES stent postdilated with a 2.4 mm with the 95% stenosis being reduced to 0%.        Labs/Other Tests and Data Reviewed:    EKG:  An ECG dated 08/02/18 was personally reviewed today and demonstrated:  NSR with no acute change  Recent Labs: 08/01/2018: ALT 15 08/02/2018: Magnesium 2.7; TSH 3.816 12/28/2018: BUN 47; Creatinine, Ser 9.71; Hemoglobin 10.8; Platelets 226; Potassium 4.7; Sodium 137   Recent Lipid Panel Lab Results  Component Value Date/Time   CHOL 143 02/25/2015 02:00 PM   TRIG 255 (H) 02/25/2015 02:00 PM   HDL 29 (L) 02/25/2015 02:00 PM   CHOLHDL 4.9 02/25/2015 02:00 PM   LDLCALC 63 02/25/2015 02:00 PM    Wt Readings from Last 3 Encounters:  02/05/19 279 lb (126.6 kg)  01/30/19 280 lb (127 kg)  01/14/19 280 lb 13.9 oz (127.4 kg)      Objective:    Vital Signs:  BP 136/62    Pulse 86    Wt 279 lb (126.6 kg)    BMI 43.70 kg/m    VITAL SIGNS:  reviewed GEN:  no acute distress RESPIRATORY:  normal respiratory effort, symmetric expansion CARDIOVASCULAR:  no edema right leg amputation on the left  ASSESSMENT & PLAN:     CAD without angina: He is known to have CAD with normal LV systolic function and no valve disease. He has had no recent chest pain. He has prior stenting of the RCA and diagonal branches. Non-obstructive disease in the LAD and Circumflex. Will continue ASA and Effient, statin. He is using midodrine for hypotension on HD days.   Essential HTN BP controlled-not on meds    ESRD:  on HD    Morbid obesity: Weight loss is encouraged.   HLD- 11/15/18 triglycerides 381, LDL 86 on lipitor 10 mg daily-has had elevated LFT's in past.  Would increase lipitor to 20 mg once daily. He'll talk to endocrinologist  about it. He didn't tolerate vascepa-indigestion but willing to try again.  Type 1 DM on insulin pump. A1C 7.2 down from 8  COVID-19 Education: The signs and symptoms of COVID-19 were discussed with the patient and how to seek care for testing (follow up with PCP or arrange E-visit).   The importance of social distancing was discussed today.  Time:   Today, I have spent 23  minutes with the patient with telehealth technology discussing the above problems.     Medication Adjustments/Labs and Tests Ordered: Current medicines are reviewed at length with the patient today.  Concerns regarding medicines are outlined above.   Tests Ordered: No orders of the defined types were placed in this encounter.   Medication Changes: No orders of the defined types were placed in this encounter.   Follow Up:  Either In Person or Virtual Visit in 1 year(s) Dr. [name]  Signed, Ermalinda Barrios, PA-C  02/05/2019 8:18 AM    [place]

## 2019-02-01 DIAGNOSIS — N186 End stage renal disease: Secondary | ICD-10-CM | POA: Diagnosis not present

## 2019-02-01 DIAGNOSIS — T82818A Embolism of vascular prosthetic devices, implants and grafts, initial encounter: Secondary | ICD-10-CM | POA: Diagnosis not present

## 2019-02-01 DIAGNOSIS — D631 Anemia in chronic kidney disease: Secondary | ICD-10-CM | POA: Diagnosis not present

## 2019-02-01 DIAGNOSIS — E1022 Type 1 diabetes mellitus with diabetic chronic kidney disease: Secondary | ICD-10-CM | POA: Diagnosis not present

## 2019-02-01 DIAGNOSIS — D509 Iron deficiency anemia, unspecified: Secondary | ICD-10-CM | POA: Diagnosis not present

## 2019-02-01 DIAGNOSIS — D689 Coagulation defect, unspecified: Secondary | ICD-10-CM | POA: Diagnosis not present

## 2019-02-01 DIAGNOSIS — N2581 Secondary hyperparathyroidism of renal origin: Secondary | ICD-10-CM | POA: Diagnosis not present

## 2019-02-01 DIAGNOSIS — Z992 Dependence on renal dialysis: Secondary | ICD-10-CM | POA: Diagnosis not present

## 2019-02-04 DIAGNOSIS — D631 Anemia in chronic kidney disease: Secondary | ICD-10-CM | POA: Diagnosis not present

## 2019-02-04 DIAGNOSIS — N186 End stage renal disease: Secondary | ICD-10-CM | POA: Diagnosis not present

## 2019-02-04 DIAGNOSIS — D509 Iron deficiency anemia, unspecified: Secondary | ICD-10-CM | POA: Diagnosis not present

## 2019-02-04 DIAGNOSIS — N2581 Secondary hyperparathyroidism of renal origin: Secondary | ICD-10-CM | POA: Diagnosis not present

## 2019-02-04 DIAGNOSIS — D689 Coagulation defect, unspecified: Secondary | ICD-10-CM | POA: Diagnosis not present

## 2019-02-04 DIAGNOSIS — E1022 Type 1 diabetes mellitus with diabetic chronic kidney disease: Secondary | ICD-10-CM | POA: Diagnosis not present

## 2019-02-04 DIAGNOSIS — Z992 Dependence on renal dialysis: Secondary | ICD-10-CM | POA: Diagnosis not present

## 2019-02-04 DIAGNOSIS — T82818A Embolism of vascular prosthetic devices, implants and grafts, initial encounter: Secondary | ICD-10-CM | POA: Diagnosis not present

## 2019-02-05 ENCOUNTER — Other Ambulatory Visit: Payer: Self-pay

## 2019-02-05 ENCOUNTER — Telehealth (INDEPENDENT_AMBULATORY_CARE_PROVIDER_SITE_OTHER): Payer: Medicare Other | Admitting: Physician Assistant

## 2019-02-05 ENCOUNTER — Encounter: Payer: Self-pay | Admitting: Physician Assistant

## 2019-02-05 VITALS — BP 136/62 | HR 86 | Wt 279.0 lb

## 2019-02-05 DIAGNOSIS — E782 Mixed hyperlipidemia: Secondary | ICD-10-CM

## 2019-02-05 DIAGNOSIS — Z992 Dependence on renal dialysis: Secondary | ICD-10-CM

## 2019-02-05 DIAGNOSIS — N186 End stage renal disease: Secondary | ICD-10-CM

## 2019-02-05 DIAGNOSIS — I10 Essential (primary) hypertension: Secondary | ICD-10-CM | POA: Diagnosis not present

## 2019-02-05 DIAGNOSIS — I2510 Atherosclerotic heart disease of native coronary artery without angina pectoris: Secondary | ICD-10-CM | POA: Diagnosis not present

## 2019-02-05 DIAGNOSIS — E1022 Type 1 diabetes mellitus with diabetic chronic kidney disease: Secondary | ICD-10-CM

## 2019-02-05 NOTE — Patient Instructions (Signed)
Medication Instructions:  Your physician recommends that you continue on your current medications as directed. Please refer to the Current Medication list given to you today.  If you need a refill on your cardiac medications before your next appointment, please call your pharmacy.   Lab work: None Ordered  If you have labs (blood work) drawn today and your tests are completely normal, you will receive your results only by: . MyChart Message (if you have MyChart) OR . A paper copy in the mail If you have any lab test that is abnormal or we need to change your treatment, we will call you to review the results.  Testing/Procedures: None ordered  Follow-Up: At [facility], you and your health needs are our priority.  As part of our continuing mission to provide you with exceptional heart care, we have created designated Provider Care Teams.  These Care Teams include your primary Cardiologist (physician) and Advanced Practice Providers (APPs -  Physician Assistants and Nurse Practitioners) who all work together to provide you with the care you need, when you need it. . You will need a follow up appointment in 1 year.  Please call our office 2 months in advance to schedule this appointment.  You may see Chris McAlhany, MD or one of the following Advanced Practice Providers on your designated Care Team:   . Brittainy Simmons, PA-C . Dayna Dunn, PA-C . Michele Lenze, PA-C  Any Other Special Instructions Will Be Listed Below (If Applicable).    

## 2019-02-06 DIAGNOSIS — D689 Coagulation defect, unspecified: Secondary | ICD-10-CM | POA: Diagnosis not present

## 2019-02-06 DIAGNOSIS — E1022 Type 1 diabetes mellitus with diabetic chronic kidney disease: Secondary | ICD-10-CM | POA: Diagnosis not present

## 2019-02-06 DIAGNOSIS — N186 End stage renal disease: Secondary | ICD-10-CM | POA: Diagnosis not present

## 2019-02-06 DIAGNOSIS — Z992 Dependence on renal dialysis: Secondary | ICD-10-CM | POA: Diagnosis not present

## 2019-02-06 DIAGNOSIS — D509 Iron deficiency anemia, unspecified: Secondary | ICD-10-CM | POA: Diagnosis not present

## 2019-02-06 DIAGNOSIS — N2581 Secondary hyperparathyroidism of renal origin: Secondary | ICD-10-CM | POA: Diagnosis not present

## 2019-02-06 DIAGNOSIS — T82818A Embolism of vascular prosthetic devices, implants and grafts, initial encounter: Secondary | ICD-10-CM | POA: Diagnosis not present

## 2019-02-06 DIAGNOSIS — D631 Anemia in chronic kidney disease: Secondary | ICD-10-CM | POA: Diagnosis not present

## 2019-02-08 DIAGNOSIS — Z992 Dependence on renal dialysis: Secondary | ICD-10-CM | POA: Diagnosis not present

## 2019-02-08 DIAGNOSIS — D689 Coagulation defect, unspecified: Secondary | ICD-10-CM | POA: Diagnosis not present

## 2019-02-08 DIAGNOSIS — D509 Iron deficiency anemia, unspecified: Secondary | ICD-10-CM | POA: Diagnosis not present

## 2019-02-08 DIAGNOSIS — N2581 Secondary hyperparathyroidism of renal origin: Secondary | ICD-10-CM | POA: Diagnosis not present

## 2019-02-08 DIAGNOSIS — E1022 Type 1 diabetes mellitus with diabetic chronic kidney disease: Secondary | ICD-10-CM | POA: Diagnosis not present

## 2019-02-08 DIAGNOSIS — D631 Anemia in chronic kidney disease: Secondary | ICD-10-CM | POA: Diagnosis not present

## 2019-02-08 DIAGNOSIS — T82818A Embolism of vascular prosthetic devices, implants and grafts, initial encounter: Secondary | ICD-10-CM | POA: Diagnosis not present

## 2019-02-08 DIAGNOSIS — N186 End stage renal disease: Secondary | ICD-10-CM | POA: Diagnosis not present

## 2019-02-11 ENCOUNTER — Other Ambulatory Visit: Payer: Self-pay | Admitting: *Deleted

## 2019-02-11 ENCOUNTER — Ambulatory Visit (INDEPENDENT_AMBULATORY_CARE_PROVIDER_SITE_OTHER): Payer: Self-pay | Admitting: Surgery

## 2019-02-11 ENCOUNTER — Other Ambulatory Visit: Payer: Self-pay

## 2019-02-11 ENCOUNTER — Encounter: Payer: Self-pay | Admitting: Surgery

## 2019-02-11 VITALS — BP 157/65

## 2019-02-11 DIAGNOSIS — N2581 Secondary hyperparathyroidism of renal origin: Secondary | ICD-10-CM | POA: Diagnosis not present

## 2019-02-11 DIAGNOSIS — N186 End stage renal disease: Secondary | ICD-10-CM | POA: Diagnosis not present

## 2019-02-11 DIAGNOSIS — Z992 Dependence on renal dialysis: Secondary | ICD-10-CM

## 2019-02-11 DIAGNOSIS — D689 Coagulation defect, unspecified: Secondary | ICD-10-CM | POA: Diagnosis not present

## 2019-02-11 DIAGNOSIS — D509 Iron deficiency anemia, unspecified: Secondary | ICD-10-CM | POA: Diagnosis not present

## 2019-02-11 DIAGNOSIS — D631 Anemia in chronic kidney disease: Secondary | ICD-10-CM | POA: Diagnosis not present

## 2019-02-11 DIAGNOSIS — E1022 Type 1 diabetes mellitus with diabetic chronic kidney disease: Secondary | ICD-10-CM | POA: Diagnosis not present

## 2019-02-11 DIAGNOSIS — T82818A Embolism of vascular prosthetic devices, implants and grafts, initial encounter: Secondary | ICD-10-CM | POA: Diagnosis not present

## 2019-02-11 NOTE — H&P (View-Only) (Signed)
            [facility]  Patient name: Dale [name] MRN: [id number] DOB: 10/12/1965 Sex: male      Virtual Visit via Telephone Note   This visit type was conducted due to national recommendations for restrictions regarding the COVID-19 Pandemic (e.g. social distancing) in an effort to limit this patient's exposure and mitigate transmission in our community.  Due to his co-morbid illnesses, this patient is at least at moderate risk for complications without adequate follow up.  This format is felt to be most appropriate for this patient at this time.  The patient did not have access to video technology/had technical difficulties with video requiring transitioning to audio format only (telephone).  All issues noted in this document were discussed and addressed.  No physical exam could be performed with this format.   Patient Location: Home Provider Location: Office      HISTORY OF PRESENT ILLNESS:   Dale [name] is a 53 y.o. male who is status post first stage left BVT on 11-01-2018. His right central venous system is occluded based on venography.  On 12-27-2018, he underwent a second stage left BVT  He as had numbness and swelling since the surgery and has gotten to the point where he can not tolerate the discomfort    The patient does not have symptoms concerning for COVID-19 infection (fever, chills, cough, or new shortness of breath).   PAST MEDICAL HISTORY    Past Medical History:  Diagnosis Date  . Anal fissure   . Anemia of chronic disease   . Arthritis    "hands, right knee" (03/19/2014) no cartlidge in right knee  . CAD (coronary artery disease)    a. Abnl nuc ->LHC (11/15):  mid to dist Dx 80%, mid RCA 99% (functional CTO), dist RCA 80% >> PCI: balloon angioplasty to mid RCA (could not deliver stent) - staged PCI Rotoblator atherectomy/DES to mRCA. b. NSTEMI 12/2015 s/p DES to diagonal.  . Cholecystitis    a. 08/27/2011  . Chronic  diastolic CHF (congestive heart failure) (HCC)    a. Echo 3/13:  EF 55-60%;  b. Echo (11/15):  Mild LVH, EF 60-65%, no RWMA, Gr 1 DD, MAC, mild LAE, normal RVF, mild RAE;  c.  Echo 5/16:  severe LVH, EF 55-60%, no RWMA, Gr 1 DD, MAC, mild LAE  . Claustrophobia    when things get around his face.   . Complication of anesthesia   . Difficult intubation    only once in 2015 at [facility] haven't had a problem since then  . Diverticulosis   . Esophageal dysmotility   . ESRD (end stage renal disease) (HCC)    a. 1995 s/p cadaveric transplant w/ susbequent failure after 18 yrs;  b.Dialysis initiated 07/2011 [facility] M-W-F  . Fibromyalgia   . Gastroparesis   . GERD (gastroesophageal reflux disease)   . Gout    PMH  . History of blood transfusion   . HLD (hyperlipidemia)   . Hypertension   . Hypothyroidism   . Inguinal lymphadenopathy    a. bilateral - s/p biopsy 07/2011  . Insulin dependent diabetes mellitus    Type I  . Monilial esophagitis (HCC)   . Morbid obesity (HCC)   . OSA (obstructive sleep apnea)    adjustable bed  . PAD (peripheral artery disease) (HCC)    a. s/p L BKA.  . Pericardial effusion      a.  Small by CT 08/22/11;  b.  Large by CT 08/27/11. c. Not seen on 11/2015 echo.  . Pneumonia ~ 2007  . PONV (postoperative nausea and vomiting)    vomited once after a procedure.  . T1DM (type 1 diabetes mellitus) (HCC)   . Tracheobronchomalacia      FAMILY HISTORY   Family History  Problem Relation Age of Onset  . Colon polyps Father   . Diabetes Father   . Hypertension Father   . Hypertension Mother   . Diabetes Mother   . Hyperlipidemia Mother   . Breast cancer Maternal Aunt   . Bone cancer Maternal Aunt   . Liver cancer Maternal Aunt   . Malignant hyperthermia Neg Hx   . Heart attack Neg Hx   . Stroke Neg Hx   . Colon cancer Neg Hx   . Stomach cancer Neg Hx   . Pancreatic cancer Neg Hx   . Esophageal cancer Neg Hx     SOCIAL HISTORY:   Social History    Socioeconomic History  . Marital status: Married    Spouse name: Not on file  . Number of children: Not on file  . Years of education: Not on file  . Highest education level: Not on file  Occupational History  . Occupation: Disabled  Social Needs  . Financial resource strain: Not on file  . Food insecurity    Worry: Not on file    Inability: Not on file  . Transportation needs    Medical: Not on file    Non-medical: Not on file  Tobacco Use  . Smoking status: Never Smoker  . Smokeless tobacco: Never Used  Substance and Sexual Activity  . Alcohol use: Yes    Comment: occasional  . Drug use: No  . Sexual activity: Not on file  Lifestyle  . Physical activity    Days per week: Not on file    Minutes per session: Not on file  . Stress: Not on file  Relationships  . Social connections    Talks on phone: Not on file    Gets together: Not on file    Attends religious service: Not on file    Active member of club or organization: Not on file    Attends meetings of clubs or organizations: Not on file    Relationship status: Not on file  . Intimate partner violence    Fear of current or ex partner: Not on file    Emotionally abused: Not on file    Physically abused: Not on file    Forced sexual activity: Not on file  Other Topics Concern  . Not on file  Social History Narrative   Lives @ home with his wife in [place].     ALLERGIES:    Allergies  Allergen Reactions  . Iodinated Diagnostic Agents Itching, Rash and Other (See Comments)    Systemic itching and transient rash appearance within hours of receiving contrast    . Prednisone Other (See Comments)    Increases sugar levels  . Adhesive [Tape] Itching and Other (See Comments)    Blisters on arm from adhesive tape at dialysis   . Amoxicillin Itching, Rash and Other (See Comments)    Did it involve swelling of the face/tongue/throat, SOB, or low BP? No Did it involve sudden or severe rash/hives, skin  peeling, or any reaction on the inside of your mouth or nose? No Did you need to seek medical attention at   a hospital or doctor's office? No When did it last happen?10+ years If all above answers are "NO", may proceed with cephalosporin use.   . Clindamycin/Lincomycin Hives and Itching  . Enalapril Rash and Cough    Cough  . Mobic [Meloxicam] Hives and Rash  . Morphine Hives, Nausea Only and Rash  . Brilinta [Ticagrelor] Nausea And Vomiting  . Ciprofloxacin Itching and Rash  . Clopidogrel Rash  . Codeine Hives, Itching and Rash  . Doxycycline Rash  . Hydrocodone Rash  . Levofloxacin Hives and Itching  . Oxycodone Rash  . Rocephin [Ceftriaxone] Itching    CURRENT MEDICATIONS:    Current Outpatient Medications  Medication Sig Dispense Refill  . acetaminophen (TYLENOL) 500 MG tablet Take 1,000 mg by mouth See admin instructions. Take 1000 mg at bedtime, may take an additional 1000 mg dose as needed for pain    . allopurinol (ZYLOPRIM) 100 MG tablet Take 2 tablets (200 mg total) by mouth daily. 180 tablet 3  . aspirin EC 81 MG tablet Take 81 mg by mouth at bedtime.    . atorvastatin (LIPITOR) 10 MG tablet Take 10 mg by mouth daily.     . B Complex-C-Folic Acid (DIALYVITE 800) 0.8 MG TABS Take 1 tablet by mouth daily.    . calcium acetate (PHOSLO) 667 MG capsule Take 667-1,334 mg by mouth See admin instructions. Take 1334 mg with each meal and 667 mg with each snack    . cetirizine (ZYRTEC) 10 MG tablet Take 10 mg by mouth daily.    . diphenhydrAMINE (BENADRYL) 25 MG tablet Take 50 mg by mouth at bedtime.     . docusate sodium (COLACE) 100 MG capsule Take 200 mg by mouth 2 (two) times daily.     . famotidine (PEPCID) 20 MG tablet Take 20 mg by mouth daily as needed for heartburn or indigestion.     . fluticasone (FLONASE) 50 MCG/ACT nasal spray Place 2 sprays into both nostrils daily as needed for allergies.     . gabapentin (NEURONTIN) 300 MG capsule Take 300 mg by mouth at  bedtime.    . GLUCAGON EMERGENCY 1 MG injection Inject 1 mg into the muscle daily as needed (low blood sugar).     . Glucose-Cholecalciferol (RELION GLUCOSE SHOT) LIQD Take 1 Dose by mouth daily as needed (low blood sugar).    . hydrocortisone 2.5 % cream Apply 1 application topically 2 (two) times daily as needed (rash / itching).    . HYDROmorphone (DILAUDID) 2 MG tablet Take 2 mg by mouth 2 (two) times daily as needed for moderate pain or severe pain.     . hydrOXYzine (ATARAX/VISTARIL) 10 MG tablet Take 10 mg by mouth 3 (three) times daily as needed for itching.    . insulin lispro (HUMALOG) 100 UNIT/ML injection Inject 75-120 Units into the skin continuous. Per insulin pump    . levothyroxine (SYNTHROID, LEVOTHROID) 112 MCG tablet Take 112 mcg by mouth at bedtime.     . loperamide (IMODIUM A-D) 2 MG tablet Take 2-4 mg by mouth 4 (four) times daily as needed for diarrhea or loose stools.    . Melatonin 5 MG TABS Take 5 mg by mouth at bedtime.    . metoCLOPramide (REGLAN) 10 MG tablet Take 10 mg by mouth 4 (four) times daily -  before meals and at bedtime.     . midodrine (PROAMATINE) 10 MG tablet Take 1 tablet (10 mg total) by mouth 3 (three) times daily. Takes   1 tab in mornings of dialysis and takes 2 tabs 30 mins before starting dialysis (m,w,f) (Patient taking differently: Take 10 mg by mouth See admin instructions. Take 10 mg 3 times daily on Sun, Tue, Thur, and Sat.  Take 10 mg before dialysis and 20 mg during dialysis treatment on Mon, Wed, and Fri.)    . nitroGLYCERIN (NITROSTAT) 0.4 MG SL tablet Place 1 tablet (0.4 mg total) under the tongue every 5 (five) minutes as needed for chest pain. 25 tablet 4  . Nutritional Supplements (FEEDING SUPPLEMENT, NEPRO CARB STEADY,) LIQD Take 237 mLs by mouth daily. (Patient taking differently: Take 237 mLs by mouth 2 (two) times a week. ) 30 Can 0  . nystatin (NYSTATIN) powder Apply 1 g topically daily as needed (yeast).    . Polyethyl Glycol-Propyl  Glycol (SYSTANE ULTRA PF OP) Place 1 drop into both eyes every 2 (two) hours as needed (dry eyes).    . Polyethyl Glycol-Propyl Glycol (SYSTANE) 0.4-0.3 % SOLN Place 1 drop into both eyes 4 (four) times daily.     . polyethylene glycol (MIRALAX / GLYCOLAX) packet Take 17 g by mouth daily. (Patient taking differently: Take 17 g by mouth daily as needed for mild constipation. ) 14 each 0  . prasugrel (EFFIENT) 10 MG TABS tablet Take 1 tablet (10 mg total) by mouth daily. Please keep upcoming appt in October before anymore refills. Thank you 90 tablet 0  . promethazine (PHENERGAN) 25 MG tablet Take 25 mg by mouth every 6 (six) hours as needed for nausea or vomiting.    . testosterone cypionate (DEPOTESTOSTERONE CYPIONATE) 200 MG/ML injection Inject 200 mg into the muscle every 14 (fourteen) days.   1  . VELPHORO 500 MG chewable tablet Chew 500-1,000 mg by mouth See admin instructions. Chew 1,000 mg by mouth three times a day with meals and 500 mg twice daily with snacks  10  . rOPINIRole (REQUIP) 2 MG tablet Take 1 tablet (2 mg total) by mouth at bedtime. Take 2 mg by mouth every night at bedtime (Patient taking differently: Take 2 mg by mouth daily with supper. ) 30 tablet 0   No current facility-administered medications for this visit.     REVIEW OF SYSTEMS:   Please see the history of present illness.     All other systems reviewed and are negative.  PHYSICAL EXAM:   Vitals:   02/11/19 1157  BP: (!) 157/65    GENERAL: The patient is  in no acute distress.  PULMONARY: Nonlabored breathing PSYCHIATRIC: The patient has a normal affect.   Recent Labs: 08/01/2018: ALT 15 08/02/2018: Magnesium 2.7; TSH 3.816 12/28/2018: BUN 47; Creatinine, Ser 9.71; Hemoglobin 10.8; Platelets 226; Potassium 4.7; Sodium 137   Recent Lipid Panel Lab Results  Component Value Date/Time   CHOL 143 02/25/2015 02:00 PM   TRIG 255 (H) 02/25/2015 02:00 PM   HDL 29 (L) 02/25/2015 02:00 PM   CHOLHDL 4.9 02/25/2015  02:00 PM   LDLCALC 63 02/25/2015 02:00 PM    Wt Readings from Last 3 Encounters:  02/05/19 279 lb (126.6 kg)  01/30/19 280 lb (127 kg)  01/14/19 280 lb 13.9 oz (127.4 kg)     STUDIES:   none  ASSESSMENT and PLAN   I will plan on ligation of the left arm fistula.  He will stop his Eliquis    Time:   Today, I have spent 9 minutes with the patient with telehealth technology discussing the above problems.          Wells Nicanor Mendolia, IV, MD, FACS [facility] [place] Tel [phone] Pager [phone]  

## 2019-02-11 NOTE — Progress Notes (Signed)
[facility] [place]  Patient name: Dale [name] MRN: [id number] DOB: 05/10/1965 Sex: male      Virtual Visit via Telephone Note   This visit type was conducted due to national recommendations for restrictions regarding the COVID-19 Pandemic (e.g. social distancing) in an effort to limit this patient's exposure and mitigate transmission in our community.  Due to his co-morbid illnesses, this patient is at least at moderate risk for complications without adequate follow up.  This format is felt to be most appropriate for this patient at this time.  The patient did not have access to video technology/had technical difficulties with video requiring transitioning to audio format only (telephone).  All issues noted in this document were discussed and addressed.  No physical exam could be performed with this format.   Patient Location: Home Provider Location: Office      HISTORY OF PRESENT ILLNESS:   Dale [name] is a 53 y.o. male who is status post first stage left BVT on 11-01-2018. His right central venous system is occluded based on venography.  On 12-27-2018, he underwent a second stage left BVT  He as had numbness and swelling since the surgery and has gotten to the point where he can not tolerate the discomfort    The patient does not have symptoms concerning for COVID-19 infection (fever, chills, cough, or new shortness of breath).   PAST MEDICAL HISTORY    Past Medical History:  Diagnosis Date  . Anal fissure   . Anemia of chronic disease   . Arthritis    "hands, right knee" (03/19/2014) no cartlidge in right knee  . CAD (coronary artery disease)    a. Abnl nuc ->LHC (11/15):  mid to dist Dx 80%, mid RCA 99% (functional CTO), dist RCA 80% >> PCI: balloon angioplasty to mid RCA (could not deliver stent) - staged PCI Rotoblator atherectomy/DES to [facility]. b. NSTEMI 12/2015 s/p DES to diagonal.  . Cholecystitis    a. 08/27/2011  . Chronic  diastolic CHF (congestive heart failure) ([place])    a. Echo 3/13:  EF 55-60%;  b. Echo (11/15):  Mild LVH, EF 60-65%, no RWMA, Gr 1 DD, MAC, mild LAE, normal RVF, mild RAE;  c.  Echo 5/16:  severe LVH, EF 55-60%, no RWMA, Gr 1 DD, MAC, mild LAE  . Claustrophobia    when things get around his face.   . Complication of anesthesia   . Difficult intubation    only once in 2015 at [facility] haven't had a problem since then  . Diverticulosis   . Esophageal dysmotility   . ESRD (end stage renal disease) ([place])    a. 1995 s/p cadaveric transplant w/ susbequent failure after 18 yrs;  b.Dialysis initiated 07/2011 [facility] M-W[facility]F  . Fibromyalgia   . Gastroparesis   . GERD (gastroesophageal reflux disease)   . Gout    PMH  . History of blood transfusion   . HLD (hyperlipidemia)   . Hypertension   . Hypothyroidism   . Inguinal lymphadenopathy    a. bilateral - s/p biopsy 07/2011  . Insulin dependent diabetes mellitus    Type I  . Monilial esophagitis ([place])   . Morbid obesity ([place])   . OSA (obstructive sleep apnea)    adjustable bed  . PAD (peripheral artery disease) ([facility])    a. s/p L BKA.  [name] Kitchen Pericardial effusion  a.  Small by CT 08/22/11;  b.  Large by CT 08/27/11. c. Not seen on 11/2015 echo.  . Pneumonia ~ 2007  . PONV (postoperative nausea and vomiting)    vomited once after a procedure.  . T1DM (type 1 diabetes mellitus) ([place])   . Tracheobronchomalacia      FAMILY HISTORY   Family History  Problem Relation Age of Onset  . Colon polyps Father   . Diabetes Father   . Hypertension Father   . Hypertension Mother   . Diabetes Mother   . Hyperlipidemia Mother   . Breast cancer Maternal Aunt   . Bone cancer Maternal Aunt   . Liver cancer Maternal Aunt   . Malignant hyperthermia Neg Hx   . Heart attack Neg Hx   . Stroke Neg Hx   . Colon cancer Neg Hx   . Stomach cancer Neg Hx   . Pancreatic cancer Neg Hx   . Esophageal cancer Neg Hx     SOCIAL HISTORY:   Social History    Socioeconomic History  . Marital status: Married    Spouse name: Not on file  . Number of children: Not on file  . Years of education: Not on file  . Highest education level: Not on file  Occupational History  . Occupation: Disabled  Social Needs  . Financial resource strain: Not on file  . Food insecurity    Worry: Not on file    Inability: Not on file  . Transportation needs    Medical: Not on file    Non-medical: Not on file  Tobacco Use  . Smoking status: Never Smoker  . Smokeless tobacco: Never Used  Substance and Sexual Activity  . Alcohol use: Yes    Comment: occasional  . Drug use: No  . Sexual activity: Not on file  Lifestyle  . Physical activity    Days per week: Not on file    Minutes per session: Not on file  . Stress: Not on file  Relationships  . Social Herbalist on phone: Not on file    Gets together: Not on file    Attends religious service: Not on file    Active member of club or organization: Not on file    Attends meetings of clubs or organizations: Not on file    Relationship status: Not on file  . Intimate partner violence    Fear of current or ex partner: Not on file    Emotionally abused: Not on file    Physically abused: Not on file    Forced sexual activity: Not on file  Other Topics Concern  . Not on file  Social History Narrative   Lives @ home with his wife in [place].     ALLERGIES:    Allergies  Allergen Reactions  . Iodinated Diagnostic Agents Itching, Rash and Other (See Comments)    Systemic itching and transient rash appearance within hours of receiving contrast    . Prednisone Other (See Comments)    Increases sugar levels  . Adhesive [Tape] Itching and Other (See Comments)    Blisters on arm from adhesive tape at dialysis   . Amoxicillin Itching, Rash and Other (See Comments)    Did it involve swelling of the face/tongue/throat, SOB, or low BP? No Did it involve sudden or severe rash/hives, skin  peeling, or any reaction on the inside of your mouth or nose? No Did you need to seek medical attention at  a hospital or doctor's office? No When did it last happen?10+ years If all above answers are "NO", may proceed with cephalosporin use.   . Clindamycin/Lincomycin Hives and Itching  . Enalapril Rash and Cough    Cough  . Mobic [Meloxicam] Hives and Rash  . Morphine Hives, Nausea Only and Rash  . Brilinta [Ticagrelor] Nausea And Vomiting  . Ciprofloxacin Itching and Rash  . Clopidogrel Rash  . Codeine Hives, Itching and Rash  . Doxycycline Rash  . Hydrocodone Rash  . Levofloxacin Hives and Itching  . Oxycodone Rash  . Rocephin [Ceftriaxone] Itching    CURRENT MEDICATIONS:    Current Outpatient Medications  Medication Sig Dispense Refill  . acetaminophen (TYLENOL) 500 MG tablet Take 1,000 mg by mouth See admin instructions. Take 1000 mg at bedtime, may take an additional 1000 mg dose as needed for pain    . allopurinol (ZYLOPRIM) 100 MG tablet Take 2 tablets (200 mg total) by mouth daily. 180 tablet 3  . aspirin EC 81 MG tablet Take 81 mg by mouth at bedtime.    [name] Kitchen atorvastatin (LIPITOR) 10 MG tablet Take 10 mg by mouth daily.     . B Complex-C-Folic Acid (DIALYVITE 259) 0.8 MG TABS Take 1 tablet by mouth daily.    . calcium acetate (PHOSLO) 667 MG capsule Take 667-1,334 mg by mouth See admin instructions. Take 1334 mg with each meal and 667 mg with each snack    . cetirizine (ZYRTEC) 10 MG tablet Take 10 mg by mouth daily.    . diphenhydrAMINE (BENADRYL) 25 MG tablet Take 50 mg by mouth at bedtime.     . docusate sodium (COLACE) 100 MG capsule Take 200 mg by mouth 2 (two) times daily.     . famotidine (PEPCID) 20 MG tablet Take 20 mg by mouth daily as needed for heartburn or indigestion.     . fluticasone (FLONASE) 50 MCG/ACT nasal spray Place 2 sprays into both nostrils daily as needed for allergies.     [name] Kitchen gabapentin (NEURONTIN) 300 MG capsule Take 300 mg by mouth at  bedtime.    [name] Kitchen GLUCAGON EMERGENCY 1 MG injection Inject 1 mg into the muscle daily as needed (low blood sugar).     . Glucose-Cholecalciferol (RELION GLUCOSE SHOT) LIQD Take 1 Dose by mouth daily as needed (low blood sugar).    . hydrocortisone 2.5 % cream Apply 1 application topically 2 (two) times daily as needed (rash / itching).    [name] Kitchen HYDROmorphone (DILAUDID) 2 MG tablet Take 2 mg by mouth 2 (two) times daily as needed for moderate pain or severe pain.     . hydrOXYzine (ATARAX/VISTARIL) 10 MG tablet Take 10 mg by mouth 3 (three) times daily as needed for itching.    . insulin lispro (HUMALOG) 100 UNIT/ML injection Inject 75-120 Units into the skin continuous. Per insulin pump    . levothyroxine (SYNTHROID, LEVOTHROID) 112 MCG tablet Take 112 mcg by mouth at bedtime.     [name] Kitchen loperamide (IMODIUM A-D) 2 MG tablet Take 2-4 mg by mouth 4 (four) times daily as needed for diarrhea or loose stools.    . Melatonin 5 MG TABS Take 5 mg by mouth at bedtime.    . metoCLOPramide (REGLAN) 10 MG tablet Take 10 mg by mouth 4 (four) times daily -  before meals and at bedtime.     . midodrine (PROAMATINE) 10 MG tablet Take 1 tablet (10 mg total) by mouth 3 (three) times daily. Takes  1 tab in mornings of dialysis and takes 2 tabs 30 mins before starting dialysis (m,w,f) (Patient taking differently: Take 10 mg by mouth See admin instructions. Take 10 mg 3 times daily on Sun, Tue, Thur, and Sat.  Take 10 mg before dialysis and 20 mg during dialysis treatment on Mon, Wed, and Fri.)    . nitroGLYCERIN (NITROSTAT) 0.4 MG SL tablet Place 1 tablet (0.4 mg total) under the tongue every 5 (five) minutes as needed for chest pain. 25 tablet 4  . Nutritional Supplements (FEEDING SUPPLEMENT, NEPRO CARB STEADY,) LIQD Take 237 mLs by mouth daily. (Patient taking differently: Take 237 mLs by mouth 2 (two) times a week. ) 30 Can 0  . nystatin (NYSTATIN) powder Apply 1 g topically daily as needed (yeast).    Vladimir Faster Glycol-Propyl  Glycol (SYSTANE ULTRA PF OP) Place 1 drop into both eyes every 2 (two) hours as needed (dry eyes).    Vladimir Faster Glycol-Propyl Glycol (SYSTANE) 0.4-0.3 % SOLN Place 1 drop into both eyes 4 (four) times daily.     . polyethylene glycol (MIRALAX / GLYCOLAX) packet Take 17 g by mouth daily. (Patient taking differently: Take 17 g by mouth daily as needed for mild constipation. ) 14 each 0  . prasugrel (EFFIENT) 10 MG TABS tablet Take 1 tablet (10 mg total) by mouth daily. Please keep upcoming appt in October before anymore refills. Thank you 90 tablet 0  . promethazine (PHENERGAN) 25 MG tablet Take 25 mg by mouth every 6 (six) hours as needed for nausea or vomiting.    [name] Kitchen testosterone cypionate (DEPOTESTOSTERONE CYPIONATE) 200 MG/ML injection Inject 200 mg into the muscle every 14 (fourteen) days.   1  . VELPHORO 500 MG chewable tablet Chew 500-1,000 mg by mouth See admin instructions. Chew 1,000 mg by mouth three times a day with meals and 500 mg twice daily with snacks  10  . rOPINIRole (REQUIP) 2 MG tablet Take 1 tablet (2 mg total) by mouth at bedtime. Take 2 mg by mouth every night at bedtime (Patient taking differently: Take 2 mg by mouth daily with supper. ) 30 tablet 0   No current facility-administered medications for this visit.     REVIEW OF SYSTEMS:   Please see the history of present illness.     All other systems reviewed and are negative.  PHYSICAL EXAM:   Vitals:   02/11/19 1157  BP: (!) 157/65    GENERAL: The patient is  in no acute distress.  PULMONARY: Nonlabored breathing PSYCHIATRIC: The patient has a normal affect.   Recent Labs: 08/01/2018: ALT 15 08/02/2018: Magnesium 2.7; TSH 3.816 12/28/2018: BUN 47; Creatinine, Ser 9.71; Hemoglobin 10.8; Platelets 226; Potassium 4.7; Sodium 137   Recent Lipid Panel Lab Results  Component Value Date/Time   CHOL 143 02/25/2015 02:00 PM   TRIG 255 (H) 02/25/2015 02:00 PM   HDL 29 (L) 02/25/2015 02:00 PM   CHOLHDL 4.9 02/25/2015  02:00 PM   LDLCALC 63 02/25/2015 02:00 PM    Wt Readings from Last 3 Encounters:  02/05/19 279 lb (126.6 kg)  01/30/19 280 lb (127 kg)  01/14/19 280 lb 13.9 oz (127.4 kg)     STUDIES:   none  ASSESSMENT and PLAN   I will plan on ligation of the left arm fistula.  He will stop his Eliquis    Time:   Today, I have spent 9 minutes with the patient with telehealth technology discussing the above problems.  Leia Alf, MD, FACS [facility] [phone] Pager [phone]

## 2019-02-12 DIAGNOSIS — E782 Mixed hyperlipidemia: Secondary | ICD-10-CM | POA: Diagnosis not present

## 2019-02-12 DIAGNOSIS — E1059 Type 1 diabetes mellitus with other circulatory complications: Secondary | ICD-10-CM | POA: Diagnosis not present

## 2019-02-12 DIAGNOSIS — E291 Testicular hypofunction: Secondary | ICD-10-CM | POA: Diagnosis not present

## 2019-02-12 DIAGNOSIS — E039 Hypothyroidism, unspecified: Secondary | ICD-10-CM | POA: Diagnosis not present

## 2019-02-13 DIAGNOSIS — N186 End stage renal disease: Secondary | ICD-10-CM | POA: Diagnosis not present

## 2019-02-13 DIAGNOSIS — D689 Coagulation defect, unspecified: Secondary | ICD-10-CM | POA: Diagnosis not present

## 2019-02-13 DIAGNOSIS — T82818A Embolism of vascular prosthetic devices, implants and grafts, initial encounter: Secondary | ICD-10-CM | POA: Diagnosis not present

## 2019-02-13 DIAGNOSIS — N2581 Secondary hyperparathyroidism of renal origin: Secondary | ICD-10-CM | POA: Diagnosis not present

## 2019-02-13 DIAGNOSIS — D631 Anemia in chronic kidney disease: Secondary | ICD-10-CM | POA: Diagnosis not present

## 2019-02-13 DIAGNOSIS — D509 Iron deficiency anemia, unspecified: Secondary | ICD-10-CM | POA: Diagnosis not present

## 2019-02-13 DIAGNOSIS — Z992 Dependence on renal dialysis: Secondary | ICD-10-CM | POA: Diagnosis not present

## 2019-02-13 DIAGNOSIS — E1022 Type 1 diabetes mellitus with diabetic chronic kidney disease: Secondary | ICD-10-CM | POA: Diagnosis not present

## 2019-02-15 DIAGNOSIS — D509 Iron deficiency anemia, unspecified: Secondary | ICD-10-CM | POA: Diagnosis not present

## 2019-02-15 DIAGNOSIS — D689 Coagulation defect, unspecified: Secondary | ICD-10-CM | POA: Diagnosis not present

## 2019-02-15 DIAGNOSIS — T82818A Embolism of vascular prosthetic devices, implants and grafts, initial encounter: Secondary | ICD-10-CM | POA: Diagnosis not present

## 2019-02-15 DIAGNOSIS — Z992 Dependence on renal dialysis: Secondary | ICD-10-CM | POA: Diagnosis not present

## 2019-02-15 DIAGNOSIS — E1022 Type 1 diabetes mellitus with diabetic chronic kidney disease: Secondary | ICD-10-CM | POA: Diagnosis not present

## 2019-02-15 DIAGNOSIS — N186 End stage renal disease: Secondary | ICD-10-CM | POA: Diagnosis not present

## 2019-02-15 DIAGNOSIS — N2581 Secondary hyperparathyroidism of renal origin: Secondary | ICD-10-CM | POA: Diagnosis not present

## 2019-02-15 DIAGNOSIS — D631 Anemia in chronic kidney disease: Secondary | ICD-10-CM | POA: Diagnosis not present

## 2019-02-16 ENCOUNTER — Other Ambulatory Visit (HOSPITAL_COMMUNITY)
Admission: RE | Admit: 2019-02-16 | Discharge: 2019-02-16 | Disposition: A | Discharge status: Home / Self Care | Payer: Medicare Other | Source: Ambulatory Visit | Attending: Surgery | Admitting: Surgery

## 2019-02-16 DIAGNOSIS — Z01812 Encounter for preprocedural laboratory examination: Secondary | ICD-10-CM | POA: Diagnosis not present

## 2019-02-16 DIAGNOSIS — Z20828 Contact with and (suspected) exposure to other viral communicable diseases: Secondary | ICD-10-CM | POA: Diagnosis not present

## 2019-02-17 LAB — NOVEL CORONAVIRUS, NAA (HOSP ORDER, SEND-OUT TO REF LAB; TAT 18-24 HRS): SARS-CoV-2, NAA: NOT DETECTED

## 2019-02-18 ENCOUNTER — Other Ambulatory Visit: Payer: Self-pay

## 2019-02-18 ENCOUNTER — Encounter (HOSPITAL_COMMUNITY): Payer: Self-pay | Admitting: *Deleted

## 2019-02-18 DIAGNOSIS — D689 Coagulation defect, unspecified: Secondary | ICD-10-CM | POA: Diagnosis not present

## 2019-02-18 DIAGNOSIS — N186 End stage renal disease: Secondary | ICD-10-CM | POA: Diagnosis not present

## 2019-02-18 DIAGNOSIS — E1022 Type 1 diabetes mellitus with diabetic chronic kidney disease: Secondary | ICD-10-CM | POA: Diagnosis not present

## 2019-02-18 DIAGNOSIS — N2581 Secondary hyperparathyroidism of renal origin: Secondary | ICD-10-CM | POA: Diagnosis not present

## 2019-02-18 DIAGNOSIS — D509 Iron deficiency anemia, unspecified: Secondary | ICD-10-CM | POA: Diagnosis not present

## 2019-02-18 DIAGNOSIS — T82818A Embolism of vascular prosthetic devices, implants and grafts, initial encounter: Secondary | ICD-10-CM | POA: Diagnosis not present

## 2019-02-18 DIAGNOSIS — Z992 Dependence on renal dialysis: Secondary | ICD-10-CM | POA: Diagnosis not present

## 2019-02-18 DIAGNOSIS — D631 Anemia in chronic kidney disease: Secondary | ICD-10-CM | POA: Diagnosis not present

## 2019-02-18 NOTE — Progress Notes (Signed)
Spoke with pt for pre-op call. Pt has hx of CAD with stents. Cardiologist Dr. [name]. Pt denies any recent chest pain or sob. Pt is a type 1 diabetic on an insulin pump. Pt's last A1C was 7.2 on 12/27/18. Pt instructed to decrease his basal rate on his insulin pump by 20% at midnight tonight. Instructed him to check his blood sugar when he gets up and every 2 hours until he leaves for the hospital. If blood sugar is >220 take 1/2 of usual correction dose of Novolog insulin. If blood sugar is 70 or below, treat with 1/2 cup of clear juice (apple or cranberry) and recheck blood sugar 15 minutes after drinking juice. If blood sugar continues to be 70 or below, call the Short Stay department and ask to speak to a nurse. He voiced understanding.   Pt had his Covid test done on 02/16/19 and it is negative. He states he's been in quarantine since (except for going to Dialysis).   Paged the Diabetes Coordinator and Adah Perl, RN returned my call. She was notified of pt's surgery date and time.

## 2019-02-19 ENCOUNTER — Encounter (HOSPITAL_COMMUNITY)
Admission: RE | Disposition: A | Discharge status: Home / Self Care | Payer: Self-pay | Source: Home / Self Care | Attending: Vascular Surgery

## 2019-02-19 ENCOUNTER — Ambulatory Visit (HOSPITAL_COMMUNITY): Payer: Medicare Other | Admitting: Anesthesiology

## 2019-02-19 ENCOUNTER — Ambulatory Visit (HOSPITAL_COMMUNITY)
Admission: RE | Admit: 2019-02-19 | Discharge: 2019-02-19 | Disposition: A | Discharge status: Home / Self Care | Payer: Medicare Other | Attending: Vascular Surgery | Admitting: Vascular Surgery

## 2019-02-19 ENCOUNTER — Other Ambulatory Visit: Payer: Self-pay

## 2019-02-19 ENCOUNTER — Encounter (HOSPITAL_COMMUNITY): Payer: Self-pay

## 2019-02-19 DIAGNOSIS — N186 End stage renal disease: Secondary | ICD-10-CM | POA: Insufficient documentation

## 2019-02-19 DIAGNOSIS — I2510 Atherosclerotic heart disease of native coronary artery without angina pectoris: Secondary | ICD-10-CM | POA: Insufficient documentation

## 2019-02-19 DIAGNOSIS — Z992 Dependence on renal dialysis: Secondary | ICD-10-CM | POA: Diagnosis not present

## 2019-02-19 DIAGNOSIS — I5032 Chronic diastolic (congestive) heart failure: Secondary | ICD-10-CM | POA: Diagnosis not present

## 2019-02-19 DIAGNOSIS — Z880 Allergy status to penicillin: Secondary | ICD-10-CM | POA: Diagnosis not present

## 2019-02-19 DIAGNOSIS — E785 Hyperlipidemia, unspecified: Secondary | ICD-10-CM | POA: Insufficient documentation

## 2019-02-19 DIAGNOSIS — Z6841 Body Mass Index (BMI) 40.0 and over, adult: Secondary | ICD-10-CM | POA: Diagnosis not present

## 2019-02-19 DIAGNOSIS — E1122 Type 2 diabetes mellitus with diabetic chronic kidney disease: Secondary | ICD-10-CM | POA: Diagnosis not present

## 2019-02-19 DIAGNOSIS — M109 Gout, unspecified: Secondary | ICD-10-CM | POA: Diagnosis not present

## 2019-02-19 DIAGNOSIS — Z955 Presence of coronary angioplasty implant and graft: Secondary | ICD-10-CM | POA: Insufficient documentation

## 2019-02-19 DIAGNOSIS — M19042 Primary osteoarthritis, left hand: Secondary | ICD-10-CM | POA: Diagnosis not present

## 2019-02-19 DIAGNOSIS — D631 Anemia in chronic kidney disease: Secondary | ICD-10-CM | POA: Diagnosis not present

## 2019-02-19 DIAGNOSIS — X58XXXA Exposure to other specified factors, initial encounter: Secondary | ICD-10-CM | POA: Diagnosis not present

## 2019-02-19 DIAGNOSIS — I252 Old myocardial infarction: Secondary | ICD-10-CM | POA: Diagnosis not present

## 2019-02-19 DIAGNOSIS — T82898A Other specified complication of vascular prosthetic devices, implants and grafts, initial encounter: Secondary | ICD-10-CM | POA: Diagnosis not present

## 2019-02-19 DIAGNOSIS — Z79890 Hormone replacement therapy (postmenopausal): Secondary | ICD-10-CM | POA: Insufficient documentation

## 2019-02-19 DIAGNOSIS — I132 Hypertensive heart and chronic kidney disease with heart failure and with stage 5 chronic kidney disease, or end stage renal disease: Secondary | ICD-10-CM | POA: Diagnosis not present

## 2019-02-19 DIAGNOSIS — Z91041 Radiographic dye allergy status: Secondary | ICD-10-CM | POA: Insufficient documentation

## 2019-02-19 DIAGNOSIS — M19041 Primary osteoarthritis, right hand: Secondary | ICD-10-CM | POA: Diagnosis not present

## 2019-02-19 DIAGNOSIS — E1022 Type 1 diabetes mellitus with diabetic chronic kidney disease: Secondary | ICD-10-CM | POA: Insufficient documentation

## 2019-02-19 DIAGNOSIS — M797 Fibromyalgia: Secondary | ICD-10-CM | POA: Insufficient documentation

## 2019-02-19 DIAGNOSIS — Z885 Allergy status to narcotic agent status: Secondary | ICD-10-CM | POA: Diagnosis not present

## 2019-02-19 DIAGNOSIS — M1711 Unilateral primary osteoarthritis, right knee: Secondary | ICD-10-CM | POA: Insufficient documentation

## 2019-02-19 DIAGNOSIS — I739 Peripheral vascular disease, unspecified: Secondary | ICD-10-CM | POA: Insufficient documentation

## 2019-02-19 DIAGNOSIS — Z886 Allergy status to analgesic agent status: Secondary | ICD-10-CM | POA: Insufficient documentation

## 2019-02-19 DIAGNOSIS — K219 Gastro-esophageal reflux disease without esophagitis: Secondary | ICD-10-CM | POA: Insufficient documentation

## 2019-02-19 DIAGNOSIS — Z79899 Other long term (current) drug therapy: Secondary | ICD-10-CM | POA: Insufficient documentation

## 2019-02-19 DIAGNOSIS — Z888 Allergy status to other drugs, medicaments and biological substances status: Secondary | ICD-10-CM | POA: Insufficient documentation

## 2019-02-19 DIAGNOSIS — E039 Hypothyroidism, unspecified: Secondary | ICD-10-CM | POA: Diagnosis not present

## 2019-02-19 DIAGNOSIS — G4733 Obstructive sleep apnea (adult) (pediatric): Secondary | ICD-10-CM | POA: Insufficient documentation

## 2019-02-19 DIAGNOSIS — Z794 Long term (current) use of insulin: Secondary | ICD-10-CM | POA: Insufficient documentation

## 2019-02-19 DIAGNOSIS — Z7982 Long term (current) use of aspirin: Secondary | ICD-10-CM | POA: Insufficient documentation

## 2019-02-19 HISTORY — DX: Personal history of urinary calculi: Z87.442

## 2019-02-19 HISTORY — PX: LIGATION OF ARTERIOVENOUS  FISTULA: SHX5948

## 2019-02-19 LAB — GLUCOSE, CAPILLARY
Glucose-Capillary: 133 mg/dL — ABNORMAL HIGH (ref 70–99)
Glucose-Capillary: 96 mg/dL (ref 70–99)

## 2019-02-19 LAB — POCT I-STAT, CHEM 8
BUN: 39 mg/dL — ABNORMAL HIGH (ref 6–20)
Calcium, Ion: 1.13 mmol/L — ABNORMAL LOW (ref 1.15–1.40)
Chloride: 99 mmol/L (ref 98–111)
Creatinine, Ser: 8.6 mg/dL — ABNORMAL HIGH (ref 0.61–1.24)
Glucose, Bld: 143 mg/dL — ABNORMAL HIGH (ref 70–99)
HCT: 43 % (ref 39.0–52.0)
Hemoglobin: 14.6 g/dL (ref 13.0–17.0)
Potassium: 4.8 mmol/L (ref 3.5–5.1)
Sodium: 137 mmol/L (ref 135–145)
TCO2: 29 mmol/L (ref 22–32)

## 2019-02-19 SURGERY — LIGATION OF ARTERIOVENOUS  FISTULA
Anesthesia: Monitor Anesthesia Care | Site: Arm Upper | Laterality: Left

## 2019-02-19 MED ORDER — FENTANYL CITRATE (PF) 250 MCG/5ML IJ SOLN
INTRAMUSCULAR | Status: AC
  Filled 2019-02-19: qty 5

## 2019-02-19 MED ORDER — FENTANYL CITRATE (PF) 100 MCG/2ML IJ SOLN
INTRAMUSCULAR | Status: DC | PRN
  Administered 2019-02-19: 25 ug via INTRAVENOUS

## 2019-02-19 MED ORDER — MIDAZOLAM HCL 2 MG/2ML IJ SOLN
INTRAMUSCULAR | Status: AC
  Filled 2019-02-19: qty 2

## 2019-02-19 MED ORDER — MIDAZOLAM HCL 5 MG/5ML IJ SOLN
INTRAMUSCULAR | Status: DC | PRN
  Administered 2019-02-19 (×2): 1 mg via INTRAVENOUS

## 2019-02-19 MED ORDER — SODIUM CHLORIDE 0.9 % IV SOLN
INTRAVENOUS | Status: DC
  Administered 2019-02-19: 09:00:00 via INTRAVENOUS

## 2019-02-19 MED ORDER — PROPOFOL 10 MG/ML IV BOLUS
INTRAVENOUS | Status: DC | PRN
  Administered 2019-02-19: 10 mg via INTRAVENOUS
  Administered 2019-02-19: 5 mg via INTRAVENOUS

## 2019-02-19 MED ORDER — LIDOCAINE HCL (CARDIAC) PF 100 MG/5ML IV SOSY
PREFILLED_SYRINGE | INTRAVENOUS | Status: DC | PRN
  Administered 2019-02-19: 20 mg via INTRATRACHEAL

## 2019-02-19 MED ORDER — VANCOMYCIN HCL 10 G IV SOLR
1500.0000 mg | INTRAVENOUS | Status: AC
  Administered 2019-02-19: 1500 mg via INTRAVENOUS
  Filled 2019-02-19: qty 1500

## 2019-02-19 MED ORDER — SODIUM CHLORIDE 0.9 % IV SOLN
INTRAVENOUS | Status: AC
  Filled 2019-02-19: qty 1.2

## 2019-02-19 MED ORDER — 0.9 % SODIUM CHLORIDE (POUR BTL) OPTIME
TOPICAL | Status: DC | PRN
  Administered 2019-02-19: 11:00:00 1000 mL

## 2019-02-19 MED ORDER — SODIUM CHLORIDE 0.9 % IV SOLN
INTRAVENOUS | Status: DC | PRN
  Administered 2019-02-19: 10:00:00 via INTRAVENOUS

## 2019-02-19 MED ORDER — TRAMADOL HCL 50 MG PO TABS: 50.0000 mg | ORAL_TABLET | Freq: Four times a day (QID) | ORAL | 0 refills | Status: DC | PRN

## 2019-02-19 MED ORDER — LIDOCAINE-EPINEPHRINE (PF) 1 %-1:200000 IJ SOLN
INTRAMUSCULAR | Status: DC | PRN
  Administered 2019-02-19: 30 mL

## 2019-02-19 SURGICAL SUPPLY — 35 items
ADH SKN CLS APL DERMABOND .7 (GAUZE/BANDAGES/DRESSINGS) ×1
BNDG ELASTIC 4X5.8 VLCR STR LF (GAUZE/BANDAGES/DRESSINGS) ×2 IMPLANT
CANISTER SUCT 3000ML PPV (MISCELLANEOUS) ×2 IMPLANT
CANNULA VESSEL 3MM 2 BLNT TIP (CANNULA) IMPLANT
COVER WAND RF STERILE (DRAPES) ×2 IMPLANT
DERMABOND ADVANCED (GAUZE/BANDAGES/DRESSINGS) ×1
DERMABOND ADVANCED .7 DNX12 (GAUZE/BANDAGES/DRESSINGS) ×1 IMPLANT
ELECT REM PT RETURN 9FT ADLT (ELECTROSURGICAL) ×2
ELECTRODE REM PT RTRN 9FT ADLT (ELECTROSURGICAL) ×1 IMPLANT
GLOVE BIO SURGEON STRL SZ7.5 (GLOVE) ×2 IMPLANT
GLOVE BIOGEL PI IND STRL 6.5 (GLOVE) IMPLANT
GLOVE BIOGEL PI IND STRL 7.5 (GLOVE) IMPLANT
GLOVE BIOGEL PI IND STRL 8 (GLOVE) ×1 IMPLANT
GLOVE BIOGEL PI INDICATOR 6.5 (GLOVE) ×2
GLOVE BIOGEL PI INDICATOR 7.5 (GLOVE) ×1
GLOVE BIOGEL PI INDICATOR 8 (GLOVE) ×1
GOWN STRL REUS W/ TWL LRG LVL3 (GOWN DISPOSABLE) ×3 IMPLANT
GOWN STRL REUS W/TWL LRG LVL3 (GOWN DISPOSABLE) ×6
KIT BASIN OR (CUSTOM PROCEDURE TRAY) ×2 IMPLANT
KIT TURNOVER KIT B (KITS) ×2 IMPLANT
NDL HYPO 25GX1X1/2 BEV (NEEDLE) IMPLANT
NEEDLE HYPO 25GX1X1/2 BEV (NEEDLE) IMPLANT
NS IRRIG 1000ML POUR BTL (IV SOLUTION) ×2 IMPLANT
PACK CV ACCESS (CUSTOM PROCEDURE TRAY) ×2 IMPLANT
PAD ARMBOARD 7.5X6 YLW CONV (MISCELLANEOUS) ×4 IMPLANT
SPONGE SURGIFOAM ABS GEL 100 (HEMOSTASIS) IMPLANT
SUT ETHILON 3 0 PS 1 (SUTURE) IMPLANT
SUT PROLENE 6 0 BV (SUTURE) ×1 IMPLANT
SUT SILK 0 TIES 10X30 (SUTURE) ×2 IMPLANT
SUT VIC AB 3-0 SH 27 (SUTURE) ×2
SUT VIC AB 3-0 SH 27X BRD (SUTURE) ×1 IMPLANT
SUT VICRYL 4-0 PS2 18IN ABS (SUTURE) ×2 IMPLANT
TOWEL GREEN STERILE (TOWEL DISPOSABLE) ×2 IMPLANT
UNDERPAD 30X30 (UNDERPADS AND DIAPERS) ×2 IMPLANT
WATER STERILE IRR 1000ML POUR (IV SOLUTION) ×2 IMPLANT

## 2019-02-19 NOTE — Interval H&P Note (Signed)
History and Physical Interval Note:  02/19/2019 10:09 AM  Dale [name]  has presented today for surgery, with the diagnosis of left hand steal syndrome.  The various methods of treatment have been discussed with the patient and family. After consideration of risks, benefits and other options for treatment, the patient has consented to  Procedure(s): LIGATION OF ARTERIOVENOUS  FISTULA LEFT ARM (Left) as a surgical intervention.  [name] history has been reviewed, patient examined, no change in status, stable for surgery.  I have reviewed the patient's chart and labs.  Questions were answered to the patient's satisfaction.     [name]

## 2019-02-19 NOTE — Anesthesia Procedure Notes (Signed)
Procedure Name: MAC Date/Time: 02/19/2019 10:40 AM Performed by: Neldon Newport, CRNA Pre-anesthesia Checklist: Timeout performed, Patient being monitored, Emergency Drugs available, Suction available and Patient identified Patient Re-evaluated:Patient Re-evaluated prior to induction Oxygen Delivery Method: Simple face mask Placement Confirmation: positive ETCO2

## 2019-02-19 NOTE — Anesthesia Postprocedure Evaluation (Signed)
Anesthesia Post Note  Patient: Dale [name]  Procedure(s) Performed: LIGATION OF ARTERIOVENOUS  FISTULA LEFT ARM (Left Arm Upper)     Patient location during evaluation: PACU Anesthesia Type: MAC Level of consciousness: awake and alert Pain management: pain level controlled Vital Signs Assessment: post-procedure vital signs reviewed and stable Respiratory status: spontaneous breathing, nonlabored ventilation, respiratory function stable and patient connected to nasal cannula oxygen Cardiovascular status: stable and blood pressure returned to baseline Postop Assessment: no apparent nausea or vomiting Anesthetic complications: no    Last Vitals:  Vitals:   02/19/19 1129 02/19/19 1144  BP: (!) 147/73 (!) 152/74  Pulse: 92 77  Resp: 17 18  Temp: 36.4 C   SpO2: 100% 98%    Last Pain:  Vitals:   02/19/19 1144  TempSrc:   PainSc: 0-No pain                 Aris Even L Doyce Saling

## 2019-02-19 NOTE — Op Note (Signed)
    NAME: Dale [name]    MRN: [id number] DOB: 01/08/1966    DATE OF OPERATION: 02/19/2019  PREOP DIAGNOSIS:    Left arm swelling  POSTOP DIAGNOSIS:    Same  PROCEDURE:    Ligation of left upper arm fistula  SURGEON: Judeth Cornfield. Scot Dock, MD  ASSIST: None  ANESTHESIA: Local with sedation  EBL: Minimal  INDICATIONS:    Dale [name] is a 53 y.o. male who had a basilic vein transposition of the left arm.  He developed significant arm swelling and ligation of the fistula was recommended.  FINDINGS:   The fistula had an excellent thrill prior to ligation.  TECHNIQUE:   The patient was taken to the operating room and sedated by anesthesia.  The left arm was prepped and draped in usual sterile fashion.  Identified an area where I could ligate the fistula easily by ultrasound.  After the skin was anesthetized a small incision was made here.  The fistula was dissected free and ligated with two 2-0 silk ties.  Hemostasis was obtained in the wound the wound was then closed the deep layer of 3-0 Vicryl and the skin closed with 4-0 Vicryl.  Dermabond was applied.  The patient tolerated procedure well and was transferred to the recovery room in stable condition.  All needle and sponge counts were correct.  [name], MD, FACS [facility]  DATE OF DICTATION:   02/19/2019

## 2019-02-19 NOTE — Transfer of Care (Signed)
Immediate Anesthesia Transfer of Care Note  Patient: Dale [name]  Procedure(s) Performed: LIGATION OF ARTERIOVENOUS  FISTULA LEFT ARM (Left Arm Upper)  Patient Location: PACU  Anesthesia Type:MAC  Level of Consciousness: awake, alert  and oriented  Airway & Oxygen Therapy: Patient Spontanous Breathing  Post-op Assessment: Report given to RN, Post -op Vital signs reviewed and stable and Patient moving all extremities X 4  Post vital signs: Reviewed and stable  Last Vitals:  Vitals Value Taken Time  BP    Temp    Pulse 92 02/19/19 1129  Resp 17 02/19/19 1129  SpO2 100 % 02/19/19 1129  Vitals shown include unvalidated device data.  Last Pain:  Vitals:   02/19/19 0925  TempSrc:   PainSc: 0-No pain         Complications: No apparent anesthesia complications

## 2019-02-19 NOTE — Anesthesia Preprocedure Evaluation (Addendum)
Anesthesia Evaluation  Patient identified by MRN, date of birth, ID band Patient awake    Reviewed: Allergy & Precautions, NPO status , Patient's Chart, lab work & pertinent test results  History of Anesthesia Complications (+) PONV, DIFFICULT AIRWAY and history of anesthetic complications (difficult intubation 2015, multiple uncomplicated intubations since then)  Airway Mallampati: II  TM Distance: >3 FB Neck ROM: Full    Dental  (+) Dental Advisory Given, Caps, Chipped,    Pulmonary sleep apnea and Oxygen sleep apnea ,    breath sounds clear to auscultation       Cardiovascular hypertension, + angina + CAD, + Past MI, + Cardiac Stents (2015, 2017), + Peripheral Vascular Disease, +CHF and + DOE   Rhythm:Regular Rate:Normal  TTE 2018 - LVEF 60-65%, moderate LVH, normal wall motion, grade 1 DD with elevated LV filling pressure, trivial MR, mild TR, RVSP 39 mmHg, normal IVC  LHC 2017 Two-vessel coronary obstructive disease with mild calcification of the proximal LAD with 30% narrowing, progressive 95% mid diagonal stenosis, 30% mid LAD narrowing; normal left circumflex coronary artery; and calcified large RCA with a widely patent stent in the mid RCA at the site of previous atherectomy, 30% acute margin narrowing, and 50% inferior branch PDA stenosis.  Successful PCI to the diagonal vessel with ultimate insertion of a 2.2515 mm Xience Alpine DES stent postdilated with a 2.4 mm with the 95% stenosis being reduced to 0%.   Neuro/Psych PSYCHIATRIC DISORDERS Anxiety negative neurological ROS     GI/Hepatic Neg liver ROS, GERD  ,Gastroparesis    Endo/Other  diabetes, Type 2, Insulin DependentHypothyroidism Morbid obesity  Renal/GU ESRF and DialysisRenal disease (K 4.8, Cr 8.6, dialysis MWF)     Musculoskeletal  (+) Arthritis , Fibromyalgia -  Abdominal   Peds  Hematology negative hematology ROS (+)   Anesthesia Other  Findings   Reproductive/Obstetrics                            Lab Results  Component Value Date   WBC 10.1 12/28/2018   HGB 14.6 02/19/2019   HCT 43.0 02/19/2019   MCV 106.9 (H) 12/28/2018   PLT 226 12/28/2018   Lab Results  Component Value Date   CREATININE 8.60 (H) 02/19/2019   BUN 39 (H) 02/19/2019   NA 137 02/19/2019   K 4.8 02/19/2019   CL 99 02/19/2019   CO2 25 12/28/2018    Anesthesia Physical  Anesthesia Plan  ASA: III  Anesthesia Plan: MAC   Post-op Pain Management:    Induction: Intravenous  PONV Risk Score and Plan: 2 and Midazolam, Dexamethasone, Ondansetron and Treatment may vary due to age or medical condition  Airway Management Planned: Natural Airway  Additional Equipment:   Intra-op Plan:   Post-operative Plan:   Informed Consent: I have reviewed the patients History and Physical, chart, labs and discussed the procedure including the risks, benefits and alternatives for the proposed anesthesia with the patient or authorized representative who has indicated his/her understanding and acceptance.     Dental advisory given  Plan Discussed with: CRNA  Anesthesia Plan Comments:         Anesthesia Quick Evaluation

## 2019-02-20 ENCOUNTER — Encounter (HOSPITAL_COMMUNITY): Payer: Self-pay | Admitting: Vascular Surgery

## 2019-02-20 DIAGNOSIS — N2581 Secondary hyperparathyroidism of renal origin: Secondary | ICD-10-CM | POA: Diagnosis not present

## 2019-02-20 DIAGNOSIS — T82818A Embolism of vascular prosthetic devices, implants and grafts, initial encounter: Secondary | ICD-10-CM | POA: Diagnosis not present

## 2019-02-20 DIAGNOSIS — E1022 Type 1 diabetes mellitus with diabetic chronic kidney disease: Secondary | ICD-10-CM | POA: Diagnosis not present

## 2019-02-20 DIAGNOSIS — N186 End stage renal disease: Secondary | ICD-10-CM | POA: Diagnosis not present

## 2019-02-20 DIAGNOSIS — D689 Coagulation defect, unspecified: Secondary | ICD-10-CM | POA: Diagnosis not present

## 2019-02-20 DIAGNOSIS — D631 Anemia in chronic kidney disease: Secondary | ICD-10-CM | POA: Diagnosis not present

## 2019-02-20 DIAGNOSIS — D509 Iron deficiency anemia, unspecified: Secondary | ICD-10-CM | POA: Diagnosis not present

## 2019-02-20 DIAGNOSIS — Z992 Dependence on renal dialysis: Secondary | ICD-10-CM | POA: Diagnosis not present

## 2019-02-22 DIAGNOSIS — N186 End stage renal disease: Secondary | ICD-10-CM | POA: Diagnosis not present

## 2019-02-22 DIAGNOSIS — T82818A Embolism of vascular prosthetic devices, implants and grafts, initial encounter: Secondary | ICD-10-CM | POA: Diagnosis not present

## 2019-02-22 DIAGNOSIS — D689 Coagulation defect, unspecified: Secondary | ICD-10-CM | POA: Diagnosis not present

## 2019-02-22 DIAGNOSIS — E1022 Type 1 diabetes mellitus with diabetic chronic kidney disease: Secondary | ICD-10-CM | POA: Diagnosis not present

## 2019-02-22 DIAGNOSIS — D631 Anemia in chronic kidney disease: Secondary | ICD-10-CM | POA: Diagnosis not present

## 2019-02-22 DIAGNOSIS — N2581 Secondary hyperparathyroidism of renal origin: Secondary | ICD-10-CM | POA: Diagnosis not present

## 2019-02-22 DIAGNOSIS — D509 Iron deficiency anemia, unspecified: Secondary | ICD-10-CM | POA: Diagnosis not present

## 2019-02-22 DIAGNOSIS — Z992 Dependence on renal dialysis: Secondary | ICD-10-CM | POA: Diagnosis not present

## 2019-02-23 DIAGNOSIS — T8612 Kidney transplant failure: Secondary | ICD-10-CM | POA: Diagnosis not present

## 2019-02-23 DIAGNOSIS — N186 End stage renal disease: Secondary | ICD-10-CM | POA: Diagnosis not present

## 2019-02-23 DIAGNOSIS — Z992 Dependence on renal dialysis: Secondary | ICD-10-CM | POA: Diagnosis not present

## 2019-02-25 DIAGNOSIS — E1022 Type 1 diabetes mellitus with diabetic chronic kidney disease: Secondary | ICD-10-CM | POA: Diagnosis not present

## 2019-02-25 DIAGNOSIS — D509 Iron deficiency anemia, unspecified: Secondary | ICD-10-CM | POA: Diagnosis not present

## 2019-02-25 DIAGNOSIS — D631 Anemia in chronic kidney disease: Secondary | ICD-10-CM | POA: Diagnosis not present

## 2019-02-25 DIAGNOSIS — Z992 Dependence on renal dialysis: Secondary | ICD-10-CM | POA: Diagnosis not present

## 2019-02-25 DIAGNOSIS — T82818A Embolism of vascular prosthetic devices, implants and grafts, initial encounter: Secondary | ICD-10-CM | POA: Diagnosis not present

## 2019-02-25 DIAGNOSIS — N186 End stage renal disease: Secondary | ICD-10-CM | POA: Diagnosis not present

## 2019-02-25 DIAGNOSIS — D689 Coagulation defect, unspecified: Secondary | ICD-10-CM | POA: Diagnosis not present

## 2019-02-25 DIAGNOSIS — N2581 Secondary hyperparathyroidism of renal origin: Secondary | ICD-10-CM | POA: Diagnosis not present

## 2019-02-27 DIAGNOSIS — D509 Iron deficiency anemia, unspecified: Secondary | ICD-10-CM | POA: Diagnosis not present

## 2019-02-27 DIAGNOSIS — Z992 Dependence on renal dialysis: Secondary | ICD-10-CM | POA: Diagnosis not present

## 2019-02-27 DIAGNOSIS — T82818A Embolism of vascular prosthetic devices, implants and grafts, initial encounter: Secondary | ICD-10-CM | POA: Diagnosis not present

## 2019-02-27 DIAGNOSIS — N186 End stage renal disease: Secondary | ICD-10-CM | POA: Diagnosis not present

## 2019-02-27 DIAGNOSIS — D631 Anemia in chronic kidney disease: Secondary | ICD-10-CM | POA: Diagnosis not present

## 2019-02-27 DIAGNOSIS — D689 Coagulation defect, unspecified: Secondary | ICD-10-CM | POA: Diagnosis not present

## 2019-02-27 DIAGNOSIS — E1022 Type 1 diabetes mellitus with diabetic chronic kidney disease: Secondary | ICD-10-CM | POA: Diagnosis not present

## 2019-02-27 DIAGNOSIS — N2581 Secondary hyperparathyroidism of renal origin: Secondary | ICD-10-CM | POA: Diagnosis not present

## 2019-03-01 DIAGNOSIS — N2581 Secondary hyperparathyroidism of renal origin: Secondary | ICD-10-CM | POA: Diagnosis not present

## 2019-03-01 DIAGNOSIS — D631 Anemia in chronic kidney disease: Secondary | ICD-10-CM | POA: Diagnosis not present

## 2019-03-01 DIAGNOSIS — Z992 Dependence on renal dialysis: Secondary | ICD-10-CM | POA: Diagnosis not present

## 2019-03-01 DIAGNOSIS — E1022 Type 1 diabetes mellitus with diabetic chronic kidney disease: Secondary | ICD-10-CM | POA: Diagnosis not present

## 2019-03-01 DIAGNOSIS — D689 Coagulation defect, unspecified: Secondary | ICD-10-CM | POA: Diagnosis not present

## 2019-03-01 DIAGNOSIS — T82818A Embolism of vascular prosthetic devices, implants and grafts, initial encounter: Secondary | ICD-10-CM | POA: Diagnosis not present

## 2019-03-01 DIAGNOSIS — D509 Iron deficiency anemia, unspecified: Secondary | ICD-10-CM | POA: Diagnosis not present

## 2019-03-01 DIAGNOSIS — N186 End stage renal disease: Secondary | ICD-10-CM | POA: Diagnosis not present

## 2019-03-04 DIAGNOSIS — E1022 Type 1 diabetes mellitus with diabetic chronic kidney disease: Secondary | ICD-10-CM | POA: Diagnosis not present

## 2019-03-04 DIAGNOSIS — D689 Coagulation defect, unspecified: Secondary | ICD-10-CM | POA: Diagnosis not present

## 2019-03-04 DIAGNOSIS — D631 Anemia in chronic kidney disease: Secondary | ICD-10-CM | POA: Diagnosis not present

## 2019-03-04 DIAGNOSIS — Z992 Dependence on renal dialysis: Secondary | ICD-10-CM | POA: Diagnosis not present

## 2019-03-04 DIAGNOSIS — T82818A Embolism of vascular prosthetic devices, implants and grafts, initial encounter: Secondary | ICD-10-CM | POA: Diagnosis not present

## 2019-03-04 DIAGNOSIS — N2581 Secondary hyperparathyroidism of renal origin: Secondary | ICD-10-CM | POA: Diagnosis not present

## 2019-03-04 DIAGNOSIS — D509 Iron deficiency anemia, unspecified: Secondary | ICD-10-CM | POA: Diagnosis not present

## 2019-03-04 DIAGNOSIS — N186 End stage renal disease: Secondary | ICD-10-CM | POA: Diagnosis not present

## 2019-03-06 DIAGNOSIS — N2581 Secondary hyperparathyroidism of renal origin: Secondary | ICD-10-CM | POA: Diagnosis not present

## 2019-03-06 DIAGNOSIS — N186 End stage renal disease: Secondary | ICD-10-CM | POA: Diagnosis not present

## 2019-03-06 DIAGNOSIS — D689 Coagulation defect, unspecified: Secondary | ICD-10-CM | POA: Diagnosis not present

## 2019-03-06 DIAGNOSIS — M2550 Pain in unspecified joint: Secondary | ICD-10-CM | POA: Diagnosis not present

## 2019-03-06 DIAGNOSIS — Z79899 Other long term (current) drug therapy: Secondary | ICD-10-CM | POA: Diagnosis not present

## 2019-03-06 DIAGNOSIS — D509 Iron deficiency anemia, unspecified: Secondary | ICD-10-CM | POA: Diagnosis not present

## 2019-03-06 DIAGNOSIS — Z992 Dependence on renal dialysis: Secondary | ICD-10-CM | POA: Diagnosis not present

## 2019-03-06 DIAGNOSIS — E1022 Type 1 diabetes mellitus with diabetic chronic kidney disease: Secondary | ICD-10-CM | POA: Diagnosis not present

## 2019-03-06 DIAGNOSIS — T82818A Embolism of vascular prosthetic devices, implants and grafts, initial encounter: Secondary | ICD-10-CM | POA: Diagnosis not present

## 2019-03-06 DIAGNOSIS — D631 Anemia in chronic kidney disease: Secondary | ICD-10-CM | POA: Diagnosis not present

## 2019-03-08 DIAGNOSIS — D689 Coagulation defect, unspecified: Secondary | ICD-10-CM | POA: Diagnosis not present

## 2019-03-08 DIAGNOSIS — E1022 Type 1 diabetes mellitus with diabetic chronic kidney disease: Secondary | ICD-10-CM | POA: Diagnosis not present

## 2019-03-08 DIAGNOSIS — M1711 Unilateral primary osteoarthritis, right knee: Secondary | ICD-10-CM | POA: Diagnosis not present

## 2019-03-08 DIAGNOSIS — D509 Iron deficiency anemia, unspecified: Secondary | ICD-10-CM | POA: Diagnosis not present

## 2019-03-08 DIAGNOSIS — D631 Anemia in chronic kidney disease: Secondary | ICD-10-CM | POA: Diagnosis not present

## 2019-03-08 DIAGNOSIS — N2581 Secondary hyperparathyroidism of renal origin: Secondary | ICD-10-CM | POA: Diagnosis not present

## 2019-03-08 DIAGNOSIS — Z992 Dependence on renal dialysis: Secondary | ICD-10-CM | POA: Diagnosis not present

## 2019-03-08 DIAGNOSIS — N186 End stage renal disease: Secondary | ICD-10-CM | POA: Diagnosis not present

## 2019-03-08 DIAGNOSIS — T82818A Embolism of vascular prosthetic devices, implants and grafts, initial encounter: Secondary | ICD-10-CM | POA: Diagnosis not present

## 2019-03-11 ENCOUNTER — Ambulatory Visit: Payer: Medicare Other

## 2019-03-11 DIAGNOSIS — D631 Anemia in chronic kidney disease: Secondary | ICD-10-CM | POA: Diagnosis not present

## 2019-03-11 DIAGNOSIS — N186 End stage renal disease: Secondary | ICD-10-CM | POA: Diagnosis not present

## 2019-03-11 DIAGNOSIS — N2581 Secondary hyperparathyroidism of renal origin: Secondary | ICD-10-CM | POA: Diagnosis not present

## 2019-03-11 DIAGNOSIS — Z992 Dependence on renal dialysis: Secondary | ICD-10-CM | POA: Diagnosis not present

## 2019-03-11 DIAGNOSIS — D689 Coagulation defect, unspecified: Secondary | ICD-10-CM | POA: Diagnosis not present

## 2019-03-11 DIAGNOSIS — E1022 Type 1 diabetes mellitus with diabetic chronic kidney disease: Secondary | ICD-10-CM | POA: Diagnosis not present

## 2019-03-11 DIAGNOSIS — T82818A Embolism of vascular prosthetic devices, implants and grafts, initial encounter: Secondary | ICD-10-CM | POA: Diagnosis not present

## 2019-03-11 DIAGNOSIS — D509 Iron deficiency anemia, unspecified: Secondary | ICD-10-CM | POA: Diagnosis not present

## 2019-03-13 ENCOUNTER — Encounter (HOSPITAL_COMMUNITY): Payer: Medicare Other

## 2019-03-13 ENCOUNTER — Ambulatory Visit: Payer: Medicare Other

## 2019-03-13 DIAGNOSIS — N186 End stage renal disease: Secondary | ICD-10-CM | POA: Diagnosis not present

## 2019-03-13 DIAGNOSIS — D689 Coagulation defect, unspecified: Secondary | ICD-10-CM | POA: Diagnosis not present

## 2019-03-13 DIAGNOSIS — N2581 Secondary hyperparathyroidism of renal origin: Secondary | ICD-10-CM | POA: Diagnosis not present

## 2019-03-13 DIAGNOSIS — D509 Iron deficiency anemia, unspecified: Secondary | ICD-10-CM | POA: Diagnosis not present

## 2019-03-13 DIAGNOSIS — Z992 Dependence on renal dialysis: Secondary | ICD-10-CM | POA: Diagnosis not present

## 2019-03-13 DIAGNOSIS — D631 Anemia in chronic kidney disease: Secondary | ICD-10-CM | POA: Diagnosis not present

## 2019-03-13 DIAGNOSIS — E1022 Type 1 diabetes mellitus with diabetic chronic kidney disease: Secondary | ICD-10-CM | POA: Diagnosis not present

## 2019-03-13 DIAGNOSIS — T82818A Embolism of vascular prosthetic devices, implants and grafts, initial encounter: Secondary | ICD-10-CM | POA: Diagnosis not present

## 2019-03-15 DIAGNOSIS — N186 End stage renal disease: Secondary | ICD-10-CM | POA: Diagnosis not present

## 2019-03-15 DIAGNOSIS — Z992 Dependence on renal dialysis: Secondary | ICD-10-CM | POA: Diagnosis not present

## 2019-03-15 DIAGNOSIS — N2581 Secondary hyperparathyroidism of renal origin: Secondary | ICD-10-CM | POA: Diagnosis not present

## 2019-03-15 DIAGNOSIS — D689 Coagulation defect, unspecified: Secondary | ICD-10-CM | POA: Diagnosis not present

## 2019-03-15 DIAGNOSIS — T82818A Embolism of vascular prosthetic devices, implants and grafts, initial encounter: Secondary | ICD-10-CM | POA: Diagnosis not present

## 2019-03-15 DIAGNOSIS — D509 Iron deficiency anemia, unspecified: Secondary | ICD-10-CM | POA: Diagnosis not present

## 2019-03-15 DIAGNOSIS — E1022 Type 1 diabetes mellitus with diabetic chronic kidney disease: Secondary | ICD-10-CM | POA: Diagnosis not present

## 2019-03-15 DIAGNOSIS — D631 Anemia in chronic kidney disease: Secondary | ICD-10-CM | POA: Diagnosis not present

## 2019-03-17 DIAGNOSIS — N186 End stage renal disease: Secondary | ICD-10-CM | POA: Diagnosis not present

## 2019-03-17 DIAGNOSIS — N2581 Secondary hyperparathyroidism of renal origin: Secondary | ICD-10-CM | POA: Diagnosis not present

## 2019-03-17 DIAGNOSIS — Z992 Dependence on renal dialysis: Secondary | ICD-10-CM | POA: Diagnosis not present

## 2019-03-17 DIAGNOSIS — T82818A Embolism of vascular prosthetic devices, implants and grafts, initial encounter: Secondary | ICD-10-CM | POA: Diagnosis not present

## 2019-03-17 DIAGNOSIS — D631 Anemia in chronic kidney disease: Secondary | ICD-10-CM | POA: Diagnosis not present

## 2019-03-17 DIAGNOSIS — E1022 Type 1 diabetes mellitus with diabetic chronic kidney disease: Secondary | ICD-10-CM | POA: Diagnosis not present

## 2019-03-17 DIAGNOSIS — D689 Coagulation defect, unspecified: Secondary | ICD-10-CM | POA: Diagnosis not present

## 2019-03-17 DIAGNOSIS — D509 Iron deficiency anemia, unspecified: Secondary | ICD-10-CM | POA: Diagnosis not present

## 2019-03-19 DIAGNOSIS — D631 Anemia in chronic kidney disease: Secondary | ICD-10-CM | POA: Diagnosis not present

## 2019-03-19 DIAGNOSIS — D509 Iron deficiency anemia, unspecified: Secondary | ICD-10-CM | POA: Diagnosis not present

## 2019-03-19 DIAGNOSIS — T82818A Embolism of vascular prosthetic devices, implants and grafts, initial encounter: Secondary | ICD-10-CM | POA: Diagnosis not present

## 2019-03-19 DIAGNOSIS — D689 Coagulation defect, unspecified: Secondary | ICD-10-CM | POA: Diagnosis not present

## 2019-03-19 DIAGNOSIS — E1022 Type 1 diabetes mellitus with diabetic chronic kidney disease: Secondary | ICD-10-CM | POA: Diagnosis not present

## 2019-03-19 DIAGNOSIS — N2581 Secondary hyperparathyroidism of renal origin: Secondary | ICD-10-CM | POA: Diagnosis not present

## 2019-03-19 DIAGNOSIS — Z992 Dependence on renal dialysis: Secondary | ICD-10-CM | POA: Diagnosis not present

## 2019-03-19 DIAGNOSIS — N186 End stage renal disease: Secondary | ICD-10-CM | POA: Diagnosis not present

## 2019-03-22 DIAGNOSIS — D509 Iron deficiency anemia, unspecified: Secondary | ICD-10-CM | POA: Diagnosis not present

## 2019-03-22 DIAGNOSIS — T82818A Embolism of vascular prosthetic devices, implants and grafts, initial encounter: Secondary | ICD-10-CM | POA: Diagnosis not present

## 2019-03-22 DIAGNOSIS — N2581 Secondary hyperparathyroidism of renal origin: Secondary | ICD-10-CM | POA: Diagnosis not present

## 2019-03-22 DIAGNOSIS — E1022 Type 1 diabetes mellitus with diabetic chronic kidney disease: Secondary | ICD-10-CM | POA: Diagnosis not present

## 2019-03-22 DIAGNOSIS — D631 Anemia in chronic kidney disease: Secondary | ICD-10-CM | POA: Diagnosis not present

## 2019-03-22 DIAGNOSIS — Z992 Dependence on renal dialysis: Secondary | ICD-10-CM | POA: Diagnosis not present

## 2019-03-22 DIAGNOSIS — N186 End stage renal disease: Secondary | ICD-10-CM | POA: Diagnosis not present

## 2019-03-22 DIAGNOSIS — D689 Coagulation defect, unspecified: Secondary | ICD-10-CM | POA: Diagnosis not present

## 2019-03-25 DIAGNOSIS — N2581 Secondary hyperparathyroidism of renal origin: Secondary | ICD-10-CM | POA: Diagnosis not present

## 2019-03-25 DIAGNOSIS — D689 Coagulation defect, unspecified: Secondary | ICD-10-CM | POA: Diagnosis not present

## 2019-03-25 DIAGNOSIS — T82818A Embolism of vascular prosthetic devices, implants and grafts, initial encounter: Secondary | ICD-10-CM | POA: Diagnosis not present

## 2019-03-25 DIAGNOSIS — D509 Iron deficiency anemia, unspecified: Secondary | ICD-10-CM | POA: Diagnosis not present

## 2019-03-25 DIAGNOSIS — N186 End stage renal disease: Secondary | ICD-10-CM | POA: Diagnosis not present

## 2019-03-25 DIAGNOSIS — D631 Anemia in chronic kidney disease: Secondary | ICD-10-CM | POA: Diagnosis not present

## 2019-03-25 DIAGNOSIS — Z992 Dependence on renal dialysis: Secondary | ICD-10-CM | POA: Diagnosis not present

## 2019-03-25 DIAGNOSIS — E1022 Type 1 diabetes mellitus with diabetic chronic kidney disease: Secondary | ICD-10-CM | POA: Diagnosis not present

## 2019-03-25 DIAGNOSIS — T8612 Kidney transplant failure: Secondary | ICD-10-CM | POA: Diagnosis not present

## 2019-03-27 DIAGNOSIS — N186 End stage renal disease: Secondary | ICD-10-CM | POA: Diagnosis not present

## 2019-03-27 DIAGNOSIS — N2581 Secondary hyperparathyroidism of renal origin: Secondary | ICD-10-CM | POA: Diagnosis not present

## 2019-03-27 DIAGNOSIS — D509 Iron deficiency anemia, unspecified: Secondary | ICD-10-CM | POA: Diagnosis not present

## 2019-03-27 DIAGNOSIS — T82818A Embolism of vascular prosthetic devices, implants and grafts, initial encounter: Secondary | ICD-10-CM | POA: Diagnosis not present

## 2019-03-27 DIAGNOSIS — D631 Anemia in chronic kidney disease: Secondary | ICD-10-CM | POA: Diagnosis not present

## 2019-03-27 DIAGNOSIS — D689 Coagulation defect, unspecified: Secondary | ICD-10-CM | POA: Diagnosis not present

## 2019-03-27 DIAGNOSIS — Z992 Dependence on renal dialysis: Secondary | ICD-10-CM | POA: Diagnosis not present

## 2019-03-27 DIAGNOSIS — E1022 Type 1 diabetes mellitus with diabetic chronic kidney disease: Secondary | ICD-10-CM | POA: Diagnosis not present

## 2019-03-29 DIAGNOSIS — D689 Coagulation defect, unspecified: Secondary | ICD-10-CM | POA: Diagnosis not present

## 2019-03-29 DIAGNOSIS — T82818A Embolism of vascular prosthetic devices, implants and grafts, initial encounter: Secondary | ICD-10-CM | POA: Diagnosis not present

## 2019-03-29 DIAGNOSIS — N186 End stage renal disease: Secondary | ICD-10-CM | POA: Diagnosis not present

## 2019-03-29 DIAGNOSIS — D509 Iron deficiency anemia, unspecified: Secondary | ICD-10-CM | POA: Diagnosis not present

## 2019-03-29 DIAGNOSIS — E1022 Type 1 diabetes mellitus with diabetic chronic kidney disease: Secondary | ICD-10-CM | POA: Diagnosis not present

## 2019-03-29 DIAGNOSIS — Z992 Dependence on renal dialysis: Secondary | ICD-10-CM | POA: Diagnosis not present

## 2019-03-29 DIAGNOSIS — N2581 Secondary hyperparathyroidism of renal origin: Secondary | ICD-10-CM | POA: Diagnosis not present

## 2019-03-29 DIAGNOSIS — D631 Anemia in chronic kidney disease: Secondary | ICD-10-CM | POA: Diagnosis not present

## 2019-04-01 DIAGNOSIS — D631 Anemia in chronic kidney disease: Secondary | ICD-10-CM | POA: Diagnosis not present

## 2019-04-01 DIAGNOSIS — D689 Coagulation defect, unspecified: Secondary | ICD-10-CM | POA: Diagnosis not present

## 2019-04-01 DIAGNOSIS — D509 Iron deficiency anemia, unspecified: Secondary | ICD-10-CM | POA: Diagnosis not present

## 2019-04-01 DIAGNOSIS — N2581 Secondary hyperparathyroidism of renal origin: Secondary | ICD-10-CM | POA: Diagnosis not present

## 2019-04-01 DIAGNOSIS — N186 End stage renal disease: Secondary | ICD-10-CM | POA: Diagnosis not present

## 2019-04-01 DIAGNOSIS — T82818A Embolism of vascular prosthetic devices, implants and grafts, initial encounter: Secondary | ICD-10-CM | POA: Diagnosis not present

## 2019-04-01 DIAGNOSIS — Z992 Dependence on renal dialysis: Secondary | ICD-10-CM | POA: Diagnosis not present

## 2019-04-01 DIAGNOSIS — E1022 Type 1 diabetes mellitus with diabetic chronic kidney disease: Secondary | ICD-10-CM | POA: Diagnosis not present

## 2019-04-03 DIAGNOSIS — E1022 Type 1 diabetes mellitus with diabetic chronic kidney disease: Secondary | ICD-10-CM | POA: Diagnosis not present

## 2019-04-03 DIAGNOSIS — Z992 Dependence on renal dialysis: Secondary | ICD-10-CM | POA: Diagnosis not present

## 2019-04-03 DIAGNOSIS — N2581 Secondary hyperparathyroidism of renal origin: Secondary | ICD-10-CM | POA: Diagnosis not present

## 2019-04-03 DIAGNOSIS — D509 Iron deficiency anemia, unspecified: Secondary | ICD-10-CM | POA: Diagnosis not present

## 2019-04-03 DIAGNOSIS — T82818A Embolism of vascular prosthetic devices, implants and grafts, initial encounter: Secondary | ICD-10-CM | POA: Diagnosis not present

## 2019-04-03 DIAGNOSIS — D631 Anemia in chronic kidney disease: Secondary | ICD-10-CM | POA: Diagnosis not present

## 2019-04-03 DIAGNOSIS — D689 Coagulation defect, unspecified: Secondary | ICD-10-CM | POA: Diagnosis not present

## 2019-04-03 DIAGNOSIS — N186 End stage renal disease: Secondary | ICD-10-CM | POA: Diagnosis not present

## 2019-04-05 DIAGNOSIS — D509 Iron deficiency anemia, unspecified: Secondary | ICD-10-CM | POA: Diagnosis not present

## 2019-04-05 DIAGNOSIS — N2581 Secondary hyperparathyroidism of renal origin: Secondary | ICD-10-CM | POA: Diagnosis not present

## 2019-04-05 DIAGNOSIS — Z992 Dependence on renal dialysis: Secondary | ICD-10-CM | POA: Diagnosis not present

## 2019-04-05 DIAGNOSIS — N186 End stage renal disease: Secondary | ICD-10-CM | POA: Diagnosis not present

## 2019-04-05 DIAGNOSIS — D631 Anemia in chronic kidney disease: Secondary | ICD-10-CM | POA: Diagnosis not present

## 2019-04-05 DIAGNOSIS — T82818A Embolism of vascular prosthetic devices, implants and grafts, initial encounter: Secondary | ICD-10-CM | POA: Diagnosis not present

## 2019-04-05 DIAGNOSIS — E1022 Type 1 diabetes mellitus with diabetic chronic kidney disease: Secondary | ICD-10-CM | POA: Diagnosis not present

## 2019-04-05 DIAGNOSIS — D689 Coagulation defect, unspecified: Secondary | ICD-10-CM | POA: Diagnosis not present

## 2019-04-08 DIAGNOSIS — N2581 Secondary hyperparathyroidism of renal origin: Secondary | ICD-10-CM | POA: Diagnosis not present

## 2019-04-08 DIAGNOSIS — T82818A Embolism of vascular prosthetic devices, implants and grafts, initial encounter: Secondary | ICD-10-CM | POA: Diagnosis not present

## 2019-04-08 DIAGNOSIS — Z992 Dependence on renal dialysis: Secondary | ICD-10-CM | POA: Diagnosis not present

## 2019-04-08 DIAGNOSIS — E1022 Type 1 diabetes mellitus with diabetic chronic kidney disease: Secondary | ICD-10-CM | POA: Diagnosis not present

## 2019-04-08 DIAGNOSIS — D631 Anemia in chronic kidney disease: Secondary | ICD-10-CM | POA: Diagnosis not present

## 2019-04-08 DIAGNOSIS — D509 Iron deficiency anemia, unspecified: Secondary | ICD-10-CM | POA: Diagnosis not present

## 2019-04-08 DIAGNOSIS — N186 End stage renal disease: Secondary | ICD-10-CM | POA: Diagnosis not present

## 2019-04-08 DIAGNOSIS — D689 Coagulation defect, unspecified: Secondary | ICD-10-CM | POA: Diagnosis not present

## 2019-04-09 DIAGNOSIS — E103551 Type 1 diabetes mellitus with stable proliferative diabetic retinopathy, right eye: Secondary | ICD-10-CM | POA: Diagnosis not present

## 2019-04-09 DIAGNOSIS — E103532 Type 1 diabetes mellitus with proliferative diabetic retinopathy with traction retinal detachment not involving the macula, left eye: Secondary | ICD-10-CM | POA: Diagnosis not present

## 2019-04-09 DIAGNOSIS — H35043 Retinal micro-aneurysms, unspecified, bilateral: Secondary | ICD-10-CM | POA: Diagnosis not present

## 2019-04-09 DIAGNOSIS — Z794 Long term (current) use of insulin: Secondary | ICD-10-CM | POA: Diagnosis not present

## 2019-04-10 DIAGNOSIS — D509 Iron deficiency anemia, unspecified: Secondary | ICD-10-CM | POA: Diagnosis not present

## 2019-04-10 DIAGNOSIS — D631 Anemia in chronic kidney disease: Secondary | ICD-10-CM | POA: Diagnosis not present

## 2019-04-10 DIAGNOSIS — N186 End stage renal disease: Secondary | ICD-10-CM | POA: Diagnosis not present

## 2019-04-10 DIAGNOSIS — Z992 Dependence on renal dialysis: Secondary | ICD-10-CM | POA: Diagnosis not present

## 2019-04-10 DIAGNOSIS — T82818A Embolism of vascular prosthetic devices, implants and grafts, initial encounter: Secondary | ICD-10-CM | POA: Diagnosis not present

## 2019-04-10 DIAGNOSIS — D689 Coagulation defect, unspecified: Secondary | ICD-10-CM | POA: Diagnosis not present

## 2019-04-10 DIAGNOSIS — N2581 Secondary hyperparathyroidism of renal origin: Secondary | ICD-10-CM | POA: Diagnosis not present

## 2019-04-10 DIAGNOSIS — E1022 Type 1 diabetes mellitus with diabetic chronic kidney disease: Secondary | ICD-10-CM | POA: Diagnosis not present

## 2019-04-12 DIAGNOSIS — D509 Iron deficiency anemia, unspecified: Secondary | ICD-10-CM | POA: Diagnosis not present

## 2019-04-12 DIAGNOSIS — Z992 Dependence on renal dialysis: Secondary | ICD-10-CM | POA: Diagnosis not present

## 2019-04-12 DIAGNOSIS — N2581 Secondary hyperparathyroidism of renal origin: Secondary | ICD-10-CM | POA: Diagnosis not present

## 2019-04-12 DIAGNOSIS — T82818A Embolism of vascular prosthetic devices, implants and grafts, initial encounter: Secondary | ICD-10-CM | POA: Diagnosis not present

## 2019-04-12 DIAGNOSIS — E1022 Type 1 diabetes mellitus with diabetic chronic kidney disease: Secondary | ICD-10-CM | POA: Diagnosis not present

## 2019-04-12 DIAGNOSIS — D689 Coagulation defect, unspecified: Secondary | ICD-10-CM | POA: Diagnosis not present

## 2019-04-12 DIAGNOSIS — D631 Anemia in chronic kidney disease: Secondary | ICD-10-CM | POA: Diagnosis not present

## 2019-04-12 DIAGNOSIS — N186 End stage renal disease: Secondary | ICD-10-CM | POA: Diagnosis not present

## 2019-04-15 DIAGNOSIS — E1059 Type 1 diabetes mellitus with other circulatory complications: Secondary | ICD-10-CM | POA: Diagnosis not present

## 2019-04-15 DIAGNOSIS — E1040 Type 1 diabetes mellitus with diabetic neuropathy, unspecified: Secondary | ICD-10-CM | POA: Diagnosis not present

## 2019-04-15 DIAGNOSIS — D509 Iron deficiency anemia, unspecified: Secondary | ICD-10-CM | POA: Diagnosis not present

## 2019-04-15 DIAGNOSIS — N2581 Secondary hyperparathyroidism of renal origin: Secondary | ICD-10-CM | POA: Diagnosis not present

## 2019-04-15 DIAGNOSIS — D631 Anemia in chronic kidney disease: Secondary | ICD-10-CM | POA: Diagnosis not present

## 2019-04-15 DIAGNOSIS — N186 End stage renal disease: Secondary | ICD-10-CM | POA: Diagnosis not present

## 2019-04-15 DIAGNOSIS — E1022 Type 1 diabetes mellitus with diabetic chronic kidney disease: Secondary | ICD-10-CM | POA: Diagnosis not present

## 2019-04-15 DIAGNOSIS — E108 Type 1 diabetes mellitus with unspecified complications: Secondary | ICD-10-CM | POA: Diagnosis not present

## 2019-04-15 DIAGNOSIS — Z794 Long term (current) use of insulin: Secondary | ICD-10-CM | POA: Diagnosis not present

## 2019-04-15 DIAGNOSIS — T82818A Embolism of vascular prosthetic devices, implants and grafts, initial encounter: Secondary | ICD-10-CM | POA: Diagnosis not present

## 2019-04-15 DIAGNOSIS — Z992 Dependence on renal dialysis: Secondary | ICD-10-CM | POA: Diagnosis not present

## 2019-04-15 DIAGNOSIS — D689 Coagulation defect, unspecified: Secondary | ICD-10-CM | POA: Diagnosis not present

## 2019-04-17 DIAGNOSIS — Z992 Dependence on renal dialysis: Secondary | ICD-10-CM | POA: Diagnosis not present

## 2019-04-17 DIAGNOSIS — T82818A Embolism of vascular prosthetic devices, implants and grafts, initial encounter: Secondary | ICD-10-CM | POA: Diagnosis not present

## 2019-04-17 DIAGNOSIS — E1022 Type 1 diabetes mellitus with diabetic chronic kidney disease: Secondary | ICD-10-CM | POA: Diagnosis not present

## 2019-04-17 DIAGNOSIS — N186 End stage renal disease: Secondary | ICD-10-CM | POA: Diagnosis not present

## 2019-04-17 DIAGNOSIS — D509 Iron deficiency anemia, unspecified: Secondary | ICD-10-CM | POA: Diagnosis not present

## 2019-04-17 DIAGNOSIS — N2581 Secondary hyperparathyroidism of renal origin: Secondary | ICD-10-CM | POA: Diagnosis not present

## 2019-04-17 DIAGNOSIS — D631 Anemia in chronic kidney disease: Secondary | ICD-10-CM | POA: Diagnosis not present

## 2019-04-17 DIAGNOSIS — D689 Coagulation defect, unspecified: Secondary | ICD-10-CM | POA: Diagnosis not present

## 2019-04-18 DIAGNOSIS — M1711 Unilateral primary osteoarthritis, right knee: Secondary | ICD-10-CM | POA: Diagnosis not present

## 2019-04-20 DIAGNOSIS — E1022 Type 1 diabetes mellitus with diabetic chronic kidney disease: Secondary | ICD-10-CM | POA: Diagnosis not present

## 2019-04-20 DIAGNOSIS — T82818A Embolism of vascular prosthetic devices, implants and grafts, initial encounter: Secondary | ICD-10-CM | POA: Diagnosis not present

## 2019-04-20 DIAGNOSIS — D631 Anemia in chronic kidney disease: Secondary | ICD-10-CM | POA: Diagnosis not present

## 2019-04-20 DIAGNOSIS — N186 End stage renal disease: Secondary | ICD-10-CM | POA: Diagnosis not present

## 2019-04-20 DIAGNOSIS — D689 Coagulation defect, unspecified: Secondary | ICD-10-CM | POA: Diagnosis not present

## 2019-04-20 DIAGNOSIS — Z992 Dependence on renal dialysis: Secondary | ICD-10-CM | POA: Diagnosis not present

## 2019-04-20 DIAGNOSIS — D509 Iron deficiency anemia, unspecified: Secondary | ICD-10-CM | POA: Diagnosis not present

## 2019-04-20 DIAGNOSIS — N2581 Secondary hyperparathyroidism of renal origin: Secondary | ICD-10-CM | POA: Diagnosis not present

## 2019-04-22 DIAGNOSIS — E1022 Type 1 diabetes mellitus with diabetic chronic kidney disease: Secondary | ICD-10-CM | POA: Diagnosis not present

## 2019-04-22 DIAGNOSIS — D689 Coagulation defect, unspecified: Secondary | ICD-10-CM | POA: Diagnosis not present

## 2019-04-22 DIAGNOSIS — D631 Anemia in chronic kidney disease: Secondary | ICD-10-CM | POA: Diagnosis not present

## 2019-04-22 DIAGNOSIS — D509 Iron deficiency anemia, unspecified: Secondary | ICD-10-CM | POA: Diagnosis not present

## 2019-04-22 DIAGNOSIS — N2581 Secondary hyperparathyroidism of renal origin: Secondary | ICD-10-CM | POA: Diagnosis not present

## 2019-04-22 DIAGNOSIS — Z992 Dependence on renal dialysis: Secondary | ICD-10-CM | POA: Diagnosis not present

## 2019-04-22 DIAGNOSIS — N186 End stage renal disease: Secondary | ICD-10-CM | POA: Diagnosis not present

## 2019-04-22 DIAGNOSIS — T82818A Embolism of vascular prosthetic devices, implants and grafts, initial encounter: Secondary | ICD-10-CM | POA: Diagnosis not present

## 2019-04-24 DIAGNOSIS — D509 Iron deficiency anemia, unspecified: Secondary | ICD-10-CM | POA: Diagnosis not present

## 2019-04-24 DIAGNOSIS — N2581 Secondary hyperparathyroidism of renal origin: Secondary | ICD-10-CM | POA: Diagnosis not present

## 2019-04-24 DIAGNOSIS — Z992 Dependence on renal dialysis: Secondary | ICD-10-CM | POA: Diagnosis not present

## 2019-04-24 DIAGNOSIS — N186 End stage renal disease: Secondary | ICD-10-CM | POA: Diagnosis not present

## 2019-04-24 DIAGNOSIS — D631 Anemia in chronic kidney disease: Secondary | ICD-10-CM | POA: Diagnosis not present

## 2019-04-24 DIAGNOSIS — E1022 Type 1 diabetes mellitus with diabetic chronic kidney disease: Secondary | ICD-10-CM | POA: Diagnosis not present

## 2019-04-24 DIAGNOSIS — D689 Coagulation defect, unspecified: Secondary | ICD-10-CM | POA: Diagnosis not present

## 2019-04-24 DIAGNOSIS — T82818A Embolism of vascular prosthetic devices, implants and grafts, initial encounter: Secondary | ICD-10-CM | POA: Diagnosis not present

## 2019-04-25 DIAGNOSIS — N186 End stage renal disease: Secondary | ICD-10-CM | POA: Diagnosis not present

## 2019-04-25 DIAGNOSIS — T8612 Kidney transplant failure: Secondary | ICD-10-CM | POA: Diagnosis not present

## 2019-04-25 DIAGNOSIS — Z992 Dependence on renal dialysis: Secondary | ICD-10-CM | POA: Diagnosis not present

## 2019-04-27 DIAGNOSIS — D631 Anemia in chronic kidney disease: Secondary | ICD-10-CM | POA: Diagnosis not present

## 2019-04-27 DIAGNOSIS — D509 Iron deficiency anemia, unspecified: Secondary | ICD-10-CM | POA: Diagnosis not present

## 2019-04-27 DIAGNOSIS — E1022 Type 1 diabetes mellitus with diabetic chronic kidney disease: Secondary | ICD-10-CM | POA: Diagnosis not present

## 2019-04-27 DIAGNOSIS — Z992 Dependence on renal dialysis: Secondary | ICD-10-CM | POA: Diagnosis not present

## 2019-04-27 DIAGNOSIS — N186 End stage renal disease: Secondary | ICD-10-CM | POA: Diagnosis not present

## 2019-04-27 DIAGNOSIS — D689 Coagulation defect, unspecified: Secondary | ICD-10-CM | POA: Diagnosis not present

## 2019-04-27 DIAGNOSIS — N2581 Secondary hyperparathyroidism of renal origin: Secondary | ICD-10-CM | POA: Diagnosis not present

## 2019-04-27 DIAGNOSIS — T82818A Embolism of vascular prosthetic devices, implants and grafts, initial encounter: Secondary | ICD-10-CM | POA: Diagnosis not present

## 2019-04-29 DIAGNOSIS — Z992 Dependence on renal dialysis: Secondary | ICD-10-CM | POA: Diagnosis not present

## 2019-04-29 DIAGNOSIS — E1022 Type 1 diabetes mellitus with diabetic chronic kidney disease: Secondary | ICD-10-CM | POA: Diagnosis not present

## 2019-04-29 DIAGNOSIS — N186 End stage renal disease: Secondary | ICD-10-CM | POA: Diagnosis not present

## 2019-04-29 DIAGNOSIS — D631 Anemia in chronic kidney disease: Secondary | ICD-10-CM | POA: Diagnosis not present

## 2019-04-29 DIAGNOSIS — T82818A Embolism of vascular prosthetic devices, implants and grafts, initial encounter: Secondary | ICD-10-CM | POA: Diagnosis not present

## 2019-04-29 DIAGNOSIS — D509 Iron deficiency anemia, unspecified: Secondary | ICD-10-CM | POA: Diagnosis not present

## 2019-04-29 DIAGNOSIS — N2581 Secondary hyperparathyroidism of renal origin: Secondary | ICD-10-CM | POA: Diagnosis not present

## 2019-04-29 DIAGNOSIS — D689 Coagulation defect, unspecified: Secondary | ICD-10-CM | POA: Diagnosis not present

## 2019-04-30 ENCOUNTER — Other Ambulatory Visit: Payer: Self-pay | Admitting: *Deleted

## 2019-04-30 MED ORDER — PRASUGREL HCL 10 MG PO TABS: 10.0000 mg | ORAL_TABLET | Freq: Every day | ORAL | 1 refills | Status: DC

## 2019-05-01 DIAGNOSIS — D509 Iron deficiency anemia, unspecified: Secondary | ICD-10-CM | POA: Diagnosis not present

## 2019-05-01 DIAGNOSIS — Z992 Dependence on renal dialysis: Secondary | ICD-10-CM | POA: Diagnosis not present

## 2019-05-01 DIAGNOSIS — T82818A Embolism of vascular prosthetic devices, implants and grafts, initial encounter: Secondary | ICD-10-CM | POA: Diagnosis not present

## 2019-05-01 DIAGNOSIS — D631 Anemia in chronic kidney disease: Secondary | ICD-10-CM | POA: Diagnosis not present

## 2019-05-01 DIAGNOSIS — D689 Coagulation defect, unspecified: Secondary | ICD-10-CM | POA: Diagnosis not present

## 2019-05-01 DIAGNOSIS — N2581 Secondary hyperparathyroidism of renal origin: Secondary | ICD-10-CM | POA: Diagnosis not present

## 2019-05-01 DIAGNOSIS — N186 End stage renal disease: Secondary | ICD-10-CM | POA: Diagnosis not present

## 2019-05-01 DIAGNOSIS — E1022 Type 1 diabetes mellitus with diabetic chronic kidney disease: Secondary | ICD-10-CM | POA: Diagnosis not present

## 2019-05-03 DIAGNOSIS — N2581 Secondary hyperparathyroidism of renal origin: Secondary | ICD-10-CM | POA: Diagnosis not present

## 2019-05-03 DIAGNOSIS — T82818A Embolism of vascular prosthetic devices, implants and grafts, initial encounter: Secondary | ICD-10-CM | POA: Diagnosis not present

## 2019-05-03 DIAGNOSIS — D509 Iron deficiency anemia, unspecified: Secondary | ICD-10-CM | POA: Diagnosis not present

## 2019-05-03 DIAGNOSIS — Z992 Dependence on renal dialysis: Secondary | ICD-10-CM | POA: Diagnosis not present

## 2019-05-03 DIAGNOSIS — E1022 Type 1 diabetes mellitus with diabetic chronic kidney disease: Secondary | ICD-10-CM | POA: Diagnosis not present

## 2019-05-03 DIAGNOSIS — D631 Anemia in chronic kidney disease: Secondary | ICD-10-CM | POA: Diagnosis not present

## 2019-05-03 DIAGNOSIS — D689 Coagulation defect, unspecified: Secondary | ICD-10-CM | POA: Diagnosis not present

## 2019-05-03 DIAGNOSIS — N186 End stage renal disease: Secondary | ICD-10-CM | POA: Diagnosis not present

## 2019-05-06 DIAGNOSIS — D689 Coagulation defect, unspecified: Secondary | ICD-10-CM | POA: Diagnosis not present

## 2019-05-06 DIAGNOSIS — D509 Iron deficiency anemia, unspecified: Secondary | ICD-10-CM | POA: Diagnosis not present

## 2019-05-06 DIAGNOSIS — N2581 Secondary hyperparathyroidism of renal origin: Secondary | ICD-10-CM | POA: Diagnosis not present

## 2019-05-06 DIAGNOSIS — D631 Anemia in chronic kidney disease: Secondary | ICD-10-CM | POA: Diagnosis not present

## 2019-05-06 DIAGNOSIS — Z992 Dependence on renal dialysis: Secondary | ICD-10-CM | POA: Diagnosis not present

## 2019-05-06 DIAGNOSIS — T82818A Embolism of vascular prosthetic devices, implants and grafts, initial encounter: Secondary | ICD-10-CM | POA: Diagnosis not present

## 2019-05-06 DIAGNOSIS — N186 End stage renal disease: Secondary | ICD-10-CM | POA: Diagnosis not present

## 2019-05-06 DIAGNOSIS — E1022 Type 1 diabetes mellitus with diabetic chronic kidney disease: Secondary | ICD-10-CM | POA: Diagnosis not present

## 2019-05-08 DIAGNOSIS — Z992 Dependence on renal dialysis: Secondary | ICD-10-CM | POA: Diagnosis not present

## 2019-05-08 DIAGNOSIS — T82818A Embolism of vascular prosthetic devices, implants and grafts, initial encounter: Secondary | ICD-10-CM | POA: Diagnosis not present

## 2019-05-08 DIAGNOSIS — N186 End stage renal disease: Secondary | ICD-10-CM | POA: Diagnosis not present

## 2019-05-08 DIAGNOSIS — D509 Iron deficiency anemia, unspecified: Secondary | ICD-10-CM | POA: Diagnosis not present

## 2019-05-08 DIAGNOSIS — D689 Coagulation defect, unspecified: Secondary | ICD-10-CM | POA: Diagnosis not present

## 2019-05-08 DIAGNOSIS — E1022 Type 1 diabetes mellitus with diabetic chronic kidney disease: Secondary | ICD-10-CM | POA: Diagnosis not present

## 2019-05-08 DIAGNOSIS — D631 Anemia in chronic kidney disease: Secondary | ICD-10-CM | POA: Diagnosis not present

## 2019-05-08 DIAGNOSIS — N2581 Secondary hyperparathyroidism of renal origin: Secondary | ICD-10-CM | POA: Diagnosis not present

## 2019-05-10 DIAGNOSIS — T82818A Embolism of vascular prosthetic devices, implants and grafts, initial encounter: Secondary | ICD-10-CM | POA: Diagnosis not present

## 2019-05-10 DIAGNOSIS — Z992 Dependence on renal dialysis: Secondary | ICD-10-CM | POA: Diagnosis not present

## 2019-05-10 DIAGNOSIS — E1022 Type 1 diabetes mellitus with diabetic chronic kidney disease: Secondary | ICD-10-CM | POA: Diagnosis not present

## 2019-05-10 DIAGNOSIS — N186 End stage renal disease: Secondary | ICD-10-CM | POA: Diagnosis not present

## 2019-05-10 DIAGNOSIS — D631 Anemia in chronic kidney disease: Secondary | ICD-10-CM | POA: Diagnosis not present

## 2019-05-10 DIAGNOSIS — D509 Iron deficiency anemia, unspecified: Secondary | ICD-10-CM | POA: Diagnosis not present

## 2019-05-10 DIAGNOSIS — N2581 Secondary hyperparathyroidism of renal origin: Secondary | ICD-10-CM | POA: Diagnosis not present

## 2019-05-10 DIAGNOSIS — D689 Coagulation defect, unspecified: Secondary | ICD-10-CM | POA: Diagnosis not present

## 2019-05-13 DIAGNOSIS — T82818A Embolism of vascular prosthetic devices, implants and grafts, initial encounter: Secondary | ICD-10-CM | POA: Diagnosis not present

## 2019-05-13 DIAGNOSIS — D631 Anemia in chronic kidney disease: Secondary | ICD-10-CM | POA: Diagnosis not present

## 2019-05-13 DIAGNOSIS — N2581 Secondary hyperparathyroidism of renal origin: Secondary | ICD-10-CM | POA: Diagnosis not present

## 2019-05-13 DIAGNOSIS — D509 Iron deficiency anemia, unspecified: Secondary | ICD-10-CM | POA: Diagnosis not present

## 2019-05-13 DIAGNOSIS — Z992 Dependence on renal dialysis: Secondary | ICD-10-CM | POA: Diagnosis not present

## 2019-05-13 DIAGNOSIS — N186 End stage renal disease: Secondary | ICD-10-CM | POA: Diagnosis not present

## 2019-05-13 DIAGNOSIS — D689 Coagulation defect, unspecified: Secondary | ICD-10-CM | POA: Diagnosis not present

## 2019-05-13 DIAGNOSIS — E1022 Type 1 diabetes mellitus with diabetic chronic kidney disease: Secondary | ICD-10-CM | POA: Diagnosis not present

## 2019-05-15 DIAGNOSIS — Z992 Dependence on renal dialysis: Secondary | ICD-10-CM | POA: Diagnosis not present

## 2019-05-15 DIAGNOSIS — D631 Anemia in chronic kidney disease: Secondary | ICD-10-CM | POA: Diagnosis not present

## 2019-05-15 DIAGNOSIS — D689 Coagulation defect, unspecified: Secondary | ICD-10-CM | POA: Diagnosis not present

## 2019-05-15 DIAGNOSIS — N186 End stage renal disease: Secondary | ICD-10-CM | POA: Diagnosis not present

## 2019-05-15 DIAGNOSIS — N2581 Secondary hyperparathyroidism of renal origin: Secondary | ICD-10-CM | POA: Diagnosis not present

## 2019-05-15 DIAGNOSIS — E1022 Type 1 diabetes mellitus with diabetic chronic kidney disease: Secondary | ICD-10-CM | POA: Diagnosis not present

## 2019-05-15 DIAGNOSIS — D509 Iron deficiency anemia, unspecified: Secondary | ICD-10-CM | POA: Diagnosis not present

## 2019-05-15 DIAGNOSIS — T82818A Embolism of vascular prosthetic devices, implants and grafts, initial encounter: Secondary | ICD-10-CM | POA: Diagnosis not present

## 2019-05-17 DIAGNOSIS — D689 Coagulation defect, unspecified: Secondary | ICD-10-CM | POA: Diagnosis not present

## 2019-05-17 DIAGNOSIS — T82818A Embolism of vascular prosthetic devices, implants and grafts, initial encounter: Secondary | ICD-10-CM | POA: Diagnosis not present

## 2019-05-17 DIAGNOSIS — N2581 Secondary hyperparathyroidism of renal origin: Secondary | ICD-10-CM | POA: Diagnosis not present

## 2019-05-17 DIAGNOSIS — D509 Iron deficiency anemia, unspecified: Secondary | ICD-10-CM | POA: Diagnosis not present

## 2019-05-17 DIAGNOSIS — Z992 Dependence on renal dialysis: Secondary | ICD-10-CM | POA: Diagnosis not present

## 2019-05-17 DIAGNOSIS — E1022 Type 1 diabetes mellitus with diabetic chronic kidney disease: Secondary | ICD-10-CM | POA: Diagnosis not present

## 2019-05-17 DIAGNOSIS — N186 End stage renal disease: Secondary | ICD-10-CM | POA: Diagnosis not present

## 2019-05-17 DIAGNOSIS — D631 Anemia in chronic kidney disease: Secondary | ICD-10-CM | POA: Diagnosis not present

## 2019-05-20 DIAGNOSIS — Z992 Dependence on renal dialysis: Secondary | ICD-10-CM | POA: Diagnosis not present

## 2019-05-20 DIAGNOSIS — N186 End stage renal disease: Secondary | ICD-10-CM | POA: Diagnosis not present

## 2019-05-20 DIAGNOSIS — D689 Coagulation defect, unspecified: Secondary | ICD-10-CM | POA: Diagnosis not present

## 2019-05-20 DIAGNOSIS — E1022 Type 1 diabetes mellitus with diabetic chronic kidney disease: Secondary | ICD-10-CM | POA: Diagnosis not present

## 2019-05-20 DIAGNOSIS — D509 Iron deficiency anemia, unspecified: Secondary | ICD-10-CM | POA: Diagnosis not present

## 2019-05-20 DIAGNOSIS — N2581 Secondary hyperparathyroidism of renal origin: Secondary | ICD-10-CM | POA: Diagnosis not present

## 2019-05-20 DIAGNOSIS — T82818A Embolism of vascular prosthetic devices, implants and grafts, initial encounter: Secondary | ICD-10-CM | POA: Diagnosis not present

## 2019-05-20 DIAGNOSIS — D631 Anemia in chronic kidney disease: Secondary | ICD-10-CM | POA: Diagnosis not present

## 2019-05-22 ENCOUNTER — Other Ambulatory Visit: Payer: Self-pay | Admitting: Family Medicine

## 2019-05-22 DIAGNOSIS — D631 Anemia in chronic kidney disease: Secondary | ICD-10-CM | POA: Diagnosis not present

## 2019-05-22 DIAGNOSIS — D689 Coagulation defect, unspecified: Secondary | ICD-10-CM | POA: Diagnosis not present

## 2019-05-22 DIAGNOSIS — E1022 Type 1 diabetes mellitus with diabetic chronic kidney disease: Secondary | ICD-10-CM | POA: Diagnosis not present

## 2019-05-22 DIAGNOSIS — Z992 Dependence on renal dialysis: Secondary | ICD-10-CM | POA: Diagnosis not present

## 2019-05-22 DIAGNOSIS — D509 Iron deficiency anemia, unspecified: Secondary | ICD-10-CM | POA: Diagnosis not present

## 2019-05-22 DIAGNOSIS — T82818A Embolism of vascular prosthetic devices, implants and grafts, initial encounter: Secondary | ICD-10-CM | POA: Diagnosis not present

## 2019-05-22 DIAGNOSIS — N2581 Secondary hyperparathyroidism of renal origin: Secondary | ICD-10-CM | POA: Diagnosis not present

## 2019-05-22 DIAGNOSIS — N186 End stage renal disease: Secondary | ICD-10-CM | POA: Diagnosis not present

## 2019-05-24 DIAGNOSIS — N2581 Secondary hyperparathyroidism of renal origin: Secondary | ICD-10-CM | POA: Diagnosis not present

## 2019-05-24 DIAGNOSIS — Z992 Dependence on renal dialysis: Secondary | ICD-10-CM | POA: Diagnosis not present

## 2019-05-24 DIAGNOSIS — D509 Iron deficiency anemia, unspecified: Secondary | ICD-10-CM | POA: Diagnosis not present

## 2019-05-24 DIAGNOSIS — D631 Anemia in chronic kidney disease: Secondary | ICD-10-CM | POA: Diagnosis not present

## 2019-05-24 DIAGNOSIS — N186 End stage renal disease: Secondary | ICD-10-CM | POA: Diagnosis not present

## 2019-05-24 DIAGNOSIS — T82818A Embolism of vascular prosthetic devices, implants and grafts, initial encounter: Secondary | ICD-10-CM | POA: Diagnosis not present

## 2019-05-24 DIAGNOSIS — D689 Coagulation defect, unspecified: Secondary | ICD-10-CM | POA: Diagnosis not present

## 2019-05-24 DIAGNOSIS — E1022 Type 1 diabetes mellitus with diabetic chronic kidney disease: Secondary | ICD-10-CM | POA: Diagnosis not present

## 2019-05-26 DIAGNOSIS — Z992 Dependence on renal dialysis: Secondary | ICD-10-CM | POA: Diagnosis not present

## 2019-05-26 DIAGNOSIS — N186 End stage renal disease: Secondary | ICD-10-CM | POA: Diagnosis not present

## 2019-05-26 DIAGNOSIS — T8612 Kidney transplant failure: Secondary | ICD-10-CM | POA: Diagnosis not present

## 2019-05-27 DIAGNOSIS — Z992 Dependence on renal dialysis: Secondary | ICD-10-CM | POA: Diagnosis not present

## 2019-05-27 DIAGNOSIS — N2581 Secondary hyperparathyroidism of renal origin: Secondary | ICD-10-CM | POA: Diagnosis not present

## 2019-05-27 DIAGNOSIS — D509 Iron deficiency anemia, unspecified: Secondary | ICD-10-CM | POA: Diagnosis not present

## 2019-05-27 DIAGNOSIS — D689 Coagulation defect, unspecified: Secondary | ICD-10-CM | POA: Diagnosis not present

## 2019-05-27 DIAGNOSIS — D631 Anemia in chronic kidney disease: Secondary | ICD-10-CM | POA: Diagnosis not present

## 2019-05-27 DIAGNOSIS — T82818A Embolism of vascular prosthetic devices, implants and grafts, initial encounter: Secondary | ICD-10-CM | POA: Diagnosis not present

## 2019-05-27 DIAGNOSIS — N186 End stage renal disease: Secondary | ICD-10-CM | POA: Diagnosis not present

## 2019-05-27 DIAGNOSIS — E1022 Type 1 diabetes mellitus with diabetic chronic kidney disease: Secondary | ICD-10-CM | POA: Diagnosis not present

## 2019-05-29 DIAGNOSIS — N2581 Secondary hyperparathyroidism of renal origin: Secondary | ICD-10-CM | POA: Diagnosis not present

## 2019-05-29 DIAGNOSIS — N186 End stage renal disease: Secondary | ICD-10-CM | POA: Diagnosis not present

## 2019-05-29 DIAGNOSIS — E1022 Type 1 diabetes mellitus with diabetic chronic kidney disease: Secondary | ICD-10-CM | POA: Diagnosis not present

## 2019-05-29 DIAGNOSIS — Z992 Dependence on renal dialysis: Secondary | ICD-10-CM | POA: Diagnosis not present

## 2019-05-29 DIAGNOSIS — T82818A Embolism of vascular prosthetic devices, implants and grafts, initial encounter: Secondary | ICD-10-CM | POA: Diagnosis not present

## 2019-05-29 DIAGNOSIS — D689 Coagulation defect, unspecified: Secondary | ICD-10-CM | POA: Diagnosis not present

## 2019-05-29 DIAGNOSIS — D631 Anemia in chronic kidney disease: Secondary | ICD-10-CM | POA: Diagnosis not present

## 2019-05-29 DIAGNOSIS — D509 Iron deficiency anemia, unspecified: Secondary | ICD-10-CM | POA: Diagnosis not present

## 2019-05-31 DIAGNOSIS — D509 Iron deficiency anemia, unspecified: Secondary | ICD-10-CM | POA: Diagnosis not present

## 2019-05-31 DIAGNOSIS — D689 Coagulation defect, unspecified: Secondary | ICD-10-CM | POA: Diagnosis not present

## 2019-05-31 DIAGNOSIS — N186 End stage renal disease: Secondary | ICD-10-CM | POA: Diagnosis not present

## 2019-05-31 DIAGNOSIS — D631 Anemia in chronic kidney disease: Secondary | ICD-10-CM | POA: Diagnosis not present

## 2019-05-31 DIAGNOSIS — Z992 Dependence on renal dialysis: Secondary | ICD-10-CM | POA: Diagnosis not present

## 2019-05-31 DIAGNOSIS — E1022 Type 1 diabetes mellitus with diabetic chronic kidney disease: Secondary | ICD-10-CM | POA: Diagnosis not present

## 2019-05-31 DIAGNOSIS — N2581 Secondary hyperparathyroidism of renal origin: Secondary | ICD-10-CM | POA: Diagnosis not present

## 2019-05-31 DIAGNOSIS — T82818A Embolism of vascular prosthetic devices, implants and grafts, initial encounter: Secondary | ICD-10-CM | POA: Diagnosis not present

## 2019-06-03 DIAGNOSIS — T82818A Embolism of vascular prosthetic devices, implants and grafts, initial encounter: Secondary | ICD-10-CM | POA: Diagnosis not present

## 2019-06-03 DIAGNOSIS — D509 Iron deficiency anemia, unspecified: Secondary | ICD-10-CM | POA: Diagnosis not present

## 2019-06-03 DIAGNOSIS — N186 End stage renal disease: Secondary | ICD-10-CM | POA: Diagnosis not present

## 2019-06-03 DIAGNOSIS — D631 Anemia in chronic kidney disease: Secondary | ICD-10-CM | POA: Diagnosis not present

## 2019-06-03 DIAGNOSIS — N2581 Secondary hyperparathyroidism of renal origin: Secondary | ICD-10-CM | POA: Diagnosis not present

## 2019-06-03 DIAGNOSIS — E1022 Type 1 diabetes mellitus with diabetic chronic kidney disease: Secondary | ICD-10-CM | POA: Diagnosis not present

## 2019-06-03 DIAGNOSIS — D689 Coagulation defect, unspecified: Secondary | ICD-10-CM | POA: Diagnosis not present

## 2019-06-03 DIAGNOSIS — Z992 Dependence on renal dialysis: Secondary | ICD-10-CM | POA: Diagnosis not present

## 2019-06-05 DIAGNOSIS — D509 Iron deficiency anemia, unspecified: Secondary | ICD-10-CM | POA: Diagnosis not present

## 2019-06-05 DIAGNOSIS — D631 Anemia in chronic kidney disease: Secondary | ICD-10-CM | POA: Diagnosis not present

## 2019-06-05 DIAGNOSIS — N186 End stage renal disease: Secondary | ICD-10-CM | POA: Diagnosis not present

## 2019-06-05 DIAGNOSIS — Z992 Dependence on renal dialysis: Secondary | ICD-10-CM | POA: Diagnosis not present

## 2019-06-05 DIAGNOSIS — E1022 Type 1 diabetes mellitus with diabetic chronic kidney disease: Secondary | ICD-10-CM | POA: Diagnosis not present

## 2019-06-05 DIAGNOSIS — T82818A Embolism of vascular prosthetic devices, implants and grafts, initial encounter: Secondary | ICD-10-CM | POA: Diagnosis not present

## 2019-06-05 DIAGNOSIS — N2581 Secondary hyperparathyroidism of renal origin: Secondary | ICD-10-CM | POA: Diagnosis not present

## 2019-06-05 DIAGNOSIS — D689 Coagulation defect, unspecified: Secondary | ICD-10-CM | POA: Diagnosis not present

## 2019-06-07 DIAGNOSIS — T82818A Embolism of vascular prosthetic devices, implants and grafts, initial encounter: Secondary | ICD-10-CM | POA: Diagnosis not present

## 2019-06-07 DIAGNOSIS — D689 Coagulation defect, unspecified: Secondary | ICD-10-CM | POA: Diagnosis not present

## 2019-06-07 DIAGNOSIS — Z992 Dependence on renal dialysis: Secondary | ICD-10-CM | POA: Diagnosis not present

## 2019-06-07 DIAGNOSIS — N2581 Secondary hyperparathyroidism of renal origin: Secondary | ICD-10-CM | POA: Diagnosis not present

## 2019-06-07 DIAGNOSIS — D631 Anemia in chronic kidney disease: Secondary | ICD-10-CM | POA: Diagnosis not present

## 2019-06-07 DIAGNOSIS — E1022 Type 1 diabetes mellitus with diabetic chronic kidney disease: Secondary | ICD-10-CM | POA: Diagnosis not present

## 2019-06-07 DIAGNOSIS — D509 Iron deficiency anemia, unspecified: Secondary | ICD-10-CM | POA: Diagnosis not present

## 2019-06-07 DIAGNOSIS — N186 End stage renal disease: Secondary | ICD-10-CM | POA: Diagnosis not present

## 2019-06-10 DIAGNOSIS — M2550 Pain in unspecified joint: Secondary | ICD-10-CM | POA: Diagnosis not present

## 2019-06-10 DIAGNOSIS — N2581 Secondary hyperparathyroidism of renal origin: Secondary | ICD-10-CM | POA: Diagnosis not present

## 2019-06-10 DIAGNOSIS — E1022 Type 1 diabetes mellitus with diabetic chronic kidney disease: Secondary | ICD-10-CM | POA: Diagnosis not present

## 2019-06-10 DIAGNOSIS — Z992 Dependence on renal dialysis: Secondary | ICD-10-CM | POA: Diagnosis not present

## 2019-06-10 DIAGNOSIS — D631 Anemia in chronic kidney disease: Secondary | ICD-10-CM | POA: Diagnosis not present

## 2019-06-10 DIAGNOSIS — T82818A Embolism of vascular prosthetic devices, implants and grafts, initial encounter: Secondary | ICD-10-CM | POA: Diagnosis not present

## 2019-06-10 DIAGNOSIS — N186 End stage renal disease: Secondary | ICD-10-CM | POA: Diagnosis not present

## 2019-06-10 DIAGNOSIS — D509 Iron deficiency anemia, unspecified: Secondary | ICD-10-CM | POA: Diagnosis not present

## 2019-06-10 DIAGNOSIS — Z79899 Other long term (current) drug therapy: Secondary | ICD-10-CM | POA: Diagnosis not present

## 2019-06-10 DIAGNOSIS — D689 Coagulation defect, unspecified: Secondary | ICD-10-CM | POA: Diagnosis not present

## 2019-06-12 DIAGNOSIS — D509 Iron deficiency anemia, unspecified: Secondary | ICD-10-CM | POA: Diagnosis not present

## 2019-06-12 DIAGNOSIS — T82818A Embolism of vascular prosthetic devices, implants and grafts, initial encounter: Secondary | ICD-10-CM | POA: Diagnosis not present

## 2019-06-12 DIAGNOSIS — D689 Coagulation defect, unspecified: Secondary | ICD-10-CM | POA: Diagnosis not present

## 2019-06-12 DIAGNOSIS — E1022 Type 1 diabetes mellitus with diabetic chronic kidney disease: Secondary | ICD-10-CM | POA: Diagnosis not present

## 2019-06-12 DIAGNOSIS — N2581 Secondary hyperparathyroidism of renal origin: Secondary | ICD-10-CM | POA: Diagnosis not present

## 2019-06-12 DIAGNOSIS — Z992 Dependence on renal dialysis: Secondary | ICD-10-CM | POA: Diagnosis not present

## 2019-06-12 DIAGNOSIS — N186 End stage renal disease: Secondary | ICD-10-CM | POA: Diagnosis not present

## 2019-06-12 DIAGNOSIS — D631 Anemia in chronic kidney disease: Secondary | ICD-10-CM | POA: Diagnosis not present

## 2019-06-14 DIAGNOSIS — T82818A Embolism of vascular prosthetic devices, implants and grafts, initial encounter: Secondary | ICD-10-CM | POA: Diagnosis not present

## 2019-06-14 DIAGNOSIS — N186 End stage renal disease: Secondary | ICD-10-CM | POA: Diagnosis not present

## 2019-06-14 DIAGNOSIS — E1022 Type 1 diabetes mellitus with diabetic chronic kidney disease: Secondary | ICD-10-CM | POA: Diagnosis not present

## 2019-06-14 DIAGNOSIS — D689 Coagulation defect, unspecified: Secondary | ICD-10-CM | POA: Diagnosis not present

## 2019-06-14 DIAGNOSIS — D631 Anemia in chronic kidney disease: Secondary | ICD-10-CM | POA: Diagnosis not present

## 2019-06-14 DIAGNOSIS — D509 Iron deficiency anemia, unspecified: Secondary | ICD-10-CM | POA: Diagnosis not present

## 2019-06-14 DIAGNOSIS — N2581 Secondary hyperparathyroidism of renal origin: Secondary | ICD-10-CM | POA: Diagnosis not present

## 2019-06-14 DIAGNOSIS — Z992 Dependence on renal dialysis: Secondary | ICD-10-CM | POA: Diagnosis not present

## 2019-06-17 DIAGNOSIS — E291 Testicular hypofunction: Secondary | ICD-10-CM | POA: Diagnosis not present

## 2019-06-17 DIAGNOSIS — D509 Iron deficiency anemia, unspecified: Secondary | ICD-10-CM | POA: Diagnosis not present

## 2019-06-17 DIAGNOSIS — Z992 Dependence on renal dialysis: Secondary | ICD-10-CM | POA: Diagnosis not present

## 2019-06-17 DIAGNOSIS — E039 Hypothyroidism, unspecified: Secondary | ICD-10-CM | POA: Diagnosis not present

## 2019-06-17 DIAGNOSIS — E782 Mixed hyperlipidemia: Secondary | ICD-10-CM | POA: Diagnosis not present

## 2019-06-17 DIAGNOSIS — D689 Coagulation defect, unspecified: Secondary | ICD-10-CM | POA: Diagnosis not present

## 2019-06-17 DIAGNOSIS — E1022 Type 1 diabetes mellitus with diabetic chronic kidney disease: Secondary | ICD-10-CM | POA: Diagnosis not present

## 2019-06-17 DIAGNOSIS — D631 Anemia in chronic kidney disease: Secondary | ICD-10-CM | POA: Diagnosis not present

## 2019-06-17 DIAGNOSIS — N186 End stage renal disease: Secondary | ICD-10-CM | POA: Diagnosis not present

## 2019-06-17 DIAGNOSIS — E103593 Type 1 diabetes mellitus with proliferative diabetic retinopathy without macular edema, bilateral: Secondary | ICD-10-CM | POA: Diagnosis not present

## 2019-06-17 DIAGNOSIS — T82818A Embolism of vascular prosthetic devices, implants and grafts, initial encounter: Secondary | ICD-10-CM | POA: Diagnosis not present

## 2019-06-17 DIAGNOSIS — N2581 Secondary hyperparathyroidism of renal origin: Secondary | ICD-10-CM | POA: Diagnosis not present

## 2019-06-19 DIAGNOSIS — D689 Coagulation defect, unspecified: Secondary | ICD-10-CM | POA: Diagnosis not present

## 2019-06-19 DIAGNOSIS — E1022 Type 1 diabetes mellitus with diabetic chronic kidney disease: Secondary | ICD-10-CM | POA: Diagnosis not present

## 2019-06-19 DIAGNOSIS — Z992 Dependence on renal dialysis: Secondary | ICD-10-CM | POA: Diagnosis not present

## 2019-06-19 DIAGNOSIS — D509 Iron deficiency anemia, unspecified: Secondary | ICD-10-CM | POA: Diagnosis not present

## 2019-06-19 DIAGNOSIS — D631 Anemia in chronic kidney disease: Secondary | ICD-10-CM | POA: Diagnosis not present

## 2019-06-19 DIAGNOSIS — T82818A Embolism of vascular prosthetic devices, implants and grafts, initial encounter: Secondary | ICD-10-CM | POA: Diagnosis not present

## 2019-06-19 DIAGNOSIS — N186 End stage renal disease: Secondary | ICD-10-CM | POA: Diagnosis not present

## 2019-06-19 DIAGNOSIS — N2581 Secondary hyperparathyroidism of renal origin: Secondary | ICD-10-CM | POA: Diagnosis not present

## 2019-06-21 DIAGNOSIS — Z992 Dependence on renal dialysis: Secondary | ICD-10-CM | POA: Diagnosis not present

## 2019-06-21 DIAGNOSIS — N2581 Secondary hyperparathyroidism of renal origin: Secondary | ICD-10-CM | POA: Diagnosis not present

## 2019-06-21 DIAGNOSIS — D631 Anemia in chronic kidney disease: Secondary | ICD-10-CM | POA: Diagnosis not present

## 2019-06-21 DIAGNOSIS — T82818A Embolism of vascular prosthetic devices, implants and grafts, initial encounter: Secondary | ICD-10-CM | POA: Diagnosis not present

## 2019-06-21 DIAGNOSIS — E1022 Type 1 diabetes mellitus with diabetic chronic kidney disease: Secondary | ICD-10-CM | POA: Diagnosis not present

## 2019-06-21 DIAGNOSIS — N186 End stage renal disease: Secondary | ICD-10-CM | POA: Diagnosis not present

## 2019-06-21 DIAGNOSIS — D689 Coagulation defect, unspecified: Secondary | ICD-10-CM | POA: Diagnosis not present

## 2019-06-21 DIAGNOSIS — D509 Iron deficiency anemia, unspecified: Secondary | ICD-10-CM | POA: Diagnosis not present

## 2019-06-23 DIAGNOSIS — Z992 Dependence on renal dialysis: Secondary | ICD-10-CM | POA: Diagnosis not present

## 2019-06-23 DIAGNOSIS — T8612 Kidney transplant failure: Secondary | ICD-10-CM | POA: Diagnosis not present

## 2019-06-23 DIAGNOSIS — N186 End stage renal disease: Secondary | ICD-10-CM | POA: Diagnosis not present

## 2019-06-24 DIAGNOSIS — Z992 Dependence on renal dialysis: Secondary | ICD-10-CM | POA: Diagnosis not present

## 2019-06-24 DIAGNOSIS — D689 Coagulation defect, unspecified: Secondary | ICD-10-CM | POA: Diagnosis not present

## 2019-06-24 DIAGNOSIS — N186 End stage renal disease: Secondary | ICD-10-CM | POA: Diagnosis not present

## 2019-06-24 DIAGNOSIS — T82818A Embolism of vascular prosthetic devices, implants and grafts, initial encounter: Secondary | ICD-10-CM | POA: Diagnosis not present

## 2019-06-24 DIAGNOSIS — D631 Anemia in chronic kidney disease: Secondary | ICD-10-CM | POA: Diagnosis not present

## 2019-06-24 DIAGNOSIS — N2581 Secondary hyperparathyroidism of renal origin: Secondary | ICD-10-CM | POA: Diagnosis not present

## 2019-06-26 DIAGNOSIS — T82818A Embolism of vascular prosthetic devices, implants and grafts, initial encounter: Secondary | ICD-10-CM | POA: Diagnosis not present

## 2019-06-26 DIAGNOSIS — N186 End stage renal disease: Secondary | ICD-10-CM | POA: Diagnosis not present

## 2019-06-26 DIAGNOSIS — N2581 Secondary hyperparathyroidism of renal origin: Secondary | ICD-10-CM | POA: Diagnosis not present

## 2019-06-26 DIAGNOSIS — Z992 Dependence on renal dialysis: Secondary | ICD-10-CM | POA: Diagnosis not present

## 2019-06-26 DIAGNOSIS — D689 Coagulation defect, unspecified: Secondary | ICD-10-CM | POA: Diagnosis not present

## 2019-06-26 DIAGNOSIS — D631 Anemia in chronic kidney disease: Secondary | ICD-10-CM | POA: Diagnosis not present

## 2019-06-28 DIAGNOSIS — N2581 Secondary hyperparathyroidism of renal origin: Secondary | ICD-10-CM | POA: Diagnosis not present

## 2019-06-28 DIAGNOSIS — D689 Coagulation defect, unspecified: Secondary | ICD-10-CM | POA: Diagnosis not present

## 2019-06-28 DIAGNOSIS — Z992 Dependence on renal dialysis: Secondary | ICD-10-CM | POA: Diagnosis not present

## 2019-06-28 DIAGNOSIS — N186 End stage renal disease: Secondary | ICD-10-CM | POA: Diagnosis not present

## 2019-06-28 DIAGNOSIS — D631 Anemia in chronic kidney disease: Secondary | ICD-10-CM | POA: Diagnosis not present

## 2019-06-28 DIAGNOSIS — T82818A Embolism of vascular prosthetic devices, implants and grafts, initial encounter: Secondary | ICD-10-CM | POA: Diagnosis not present

## 2019-07-01 DIAGNOSIS — D631 Anemia in chronic kidney disease: Secondary | ICD-10-CM | POA: Diagnosis not present

## 2019-07-01 DIAGNOSIS — N2581 Secondary hyperparathyroidism of renal origin: Secondary | ICD-10-CM | POA: Diagnosis not present

## 2019-07-01 DIAGNOSIS — Z992 Dependence on renal dialysis: Secondary | ICD-10-CM | POA: Diagnosis not present

## 2019-07-01 DIAGNOSIS — D689 Coagulation defect, unspecified: Secondary | ICD-10-CM | POA: Diagnosis not present

## 2019-07-01 DIAGNOSIS — N186 End stage renal disease: Secondary | ICD-10-CM | POA: Diagnosis not present

## 2019-07-01 DIAGNOSIS — T82818A Embolism of vascular prosthetic devices, implants and grafts, initial encounter: Secondary | ICD-10-CM | POA: Diagnosis not present

## 2019-07-03 DIAGNOSIS — T82818A Embolism of vascular prosthetic devices, implants and grafts, initial encounter: Secondary | ICD-10-CM | POA: Diagnosis not present

## 2019-07-03 DIAGNOSIS — D689 Coagulation defect, unspecified: Secondary | ICD-10-CM | POA: Diagnosis not present

## 2019-07-03 DIAGNOSIS — D631 Anemia in chronic kidney disease: Secondary | ICD-10-CM | POA: Diagnosis not present

## 2019-07-03 DIAGNOSIS — Z992 Dependence on renal dialysis: Secondary | ICD-10-CM | POA: Diagnosis not present

## 2019-07-03 DIAGNOSIS — N186 End stage renal disease: Secondary | ICD-10-CM | POA: Diagnosis not present

## 2019-07-03 DIAGNOSIS — N2581 Secondary hyperparathyroidism of renal origin: Secondary | ICD-10-CM | POA: Diagnosis not present

## 2019-07-05 DIAGNOSIS — Z992 Dependence on renal dialysis: Secondary | ICD-10-CM | POA: Diagnosis not present

## 2019-07-05 DIAGNOSIS — D689 Coagulation defect, unspecified: Secondary | ICD-10-CM | POA: Diagnosis not present

## 2019-07-05 DIAGNOSIS — T82818A Embolism of vascular prosthetic devices, implants and grafts, initial encounter: Secondary | ICD-10-CM | POA: Diagnosis not present

## 2019-07-05 DIAGNOSIS — N2581 Secondary hyperparathyroidism of renal origin: Secondary | ICD-10-CM | POA: Diagnosis not present

## 2019-07-05 DIAGNOSIS — N186 End stage renal disease: Secondary | ICD-10-CM | POA: Diagnosis not present

## 2019-07-05 DIAGNOSIS — D631 Anemia in chronic kidney disease: Secondary | ICD-10-CM | POA: Diagnosis not present

## 2019-07-08 DIAGNOSIS — Z992 Dependence on renal dialysis: Secondary | ICD-10-CM | POA: Diagnosis not present

## 2019-07-08 DIAGNOSIS — T82818A Embolism of vascular prosthetic devices, implants and grafts, initial encounter: Secondary | ICD-10-CM | POA: Diagnosis not present

## 2019-07-08 DIAGNOSIS — N2581 Secondary hyperparathyroidism of renal origin: Secondary | ICD-10-CM | POA: Diagnosis not present

## 2019-07-08 DIAGNOSIS — D689 Coagulation defect, unspecified: Secondary | ICD-10-CM | POA: Diagnosis not present

## 2019-07-08 DIAGNOSIS — N186 End stage renal disease: Secondary | ICD-10-CM | POA: Diagnosis not present

## 2019-07-08 DIAGNOSIS — D631 Anemia in chronic kidney disease: Secondary | ICD-10-CM | POA: Diagnosis not present

## 2019-07-10 DIAGNOSIS — D689 Coagulation defect, unspecified: Secondary | ICD-10-CM | POA: Diagnosis not present

## 2019-07-10 DIAGNOSIS — N186 End stage renal disease: Secondary | ICD-10-CM | POA: Diagnosis not present

## 2019-07-10 DIAGNOSIS — Z992 Dependence on renal dialysis: Secondary | ICD-10-CM | POA: Diagnosis not present

## 2019-07-10 DIAGNOSIS — D631 Anemia in chronic kidney disease: Secondary | ICD-10-CM | POA: Diagnosis not present

## 2019-07-10 DIAGNOSIS — T82818A Embolism of vascular prosthetic devices, implants and grafts, initial encounter: Secondary | ICD-10-CM | POA: Diagnosis not present

## 2019-07-10 DIAGNOSIS — N2581 Secondary hyperparathyroidism of renal origin: Secondary | ICD-10-CM | POA: Diagnosis not present

## 2019-07-12 DIAGNOSIS — D689 Coagulation defect, unspecified: Secondary | ICD-10-CM | POA: Diagnosis not present

## 2019-07-12 DIAGNOSIS — D631 Anemia in chronic kidney disease: Secondary | ICD-10-CM | POA: Diagnosis not present

## 2019-07-12 DIAGNOSIS — N2581 Secondary hyperparathyroidism of renal origin: Secondary | ICD-10-CM | POA: Diagnosis not present

## 2019-07-12 DIAGNOSIS — T82818A Embolism of vascular prosthetic devices, implants and grafts, initial encounter: Secondary | ICD-10-CM | POA: Diagnosis not present

## 2019-07-12 DIAGNOSIS — N186 End stage renal disease: Secondary | ICD-10-CM | POA: Diagnosis not present

## 2019-07-12 DIAGNOSIS — Z992 Dependence on renal dialysis: Secondary | ICD-10-CM | POA: Diagnosis not present

## 2019-07-15 DIAGNOSIS — N2581 Secondary hyperparathyroidism of renal origin: Secondary | ICD-10-CM | POA: Diagnosis not present

## 2019-07-15 DIAGNOSIS — D631 Anemia in chronic kidney disease: Secondary | ICD-10-CM | POA: Diagnosis not present

## 2019-07-15 DIAGNOSIS — N186 End stage renal disease: Secondary | ICD-10-CM | POA: Diagnosis not present

## 2019-07-15 DIAGNOSIS — Z992 Dependence on renal dialysis: Secondary | ICD-10-CM | POA: Diagnosis not present

## 2019-07-15 DIAGNOSIS — D689 Coagulation defect, unspecified: Secondary | ICD-10-CM | POA: Diagnosis not present

## 2019-07-15 DIAGNOSIS — T82818A Embolism of vascular prosthetic devices, implants and grafts, initial encounter: Secondary | ICD-10-CM | POA: Diagnosis not present

## 2019-07-16 DIAGNOSIS — M1711 Unilateral primary osteoarthritis, right knee: Secondary | ICD-10-CM | POA: Diagnosis not present

## 2019-07-17 DIAGNOSIS — N2581 Secondary hyperparathyroidism of renal origin: Secondary | ICD-10-CM | POA: Diagnosis not present

## 2019-07-17 DIAGNOSIS — D631 Anemia in chronic kidney disease: Secondary | ICD-10-CM | POA: Diagnosis not present

## 2019-07-17 DIAGNOSIS — Z992 Dependence on renal dialysis: Secondary | ICD-10-CM | POA: Diagnosis not present

## 2019-07-17 DIAGNOSIS — N186 End stage renal disease: Secondary | ICD-10-CM | POA: Diagnosis not present

## 2019-07-17 DIAGNOSIS — T82818A Embolism of vascular prosthetic devices, implants and grafts, initial encounter: Secondary | ICD-10-CM | POA: Diagnosis not present

## 2019-07-17 DIAGNOSIS — D689 Coagulation defect, unspecified: Secondary | ICD-10-CM | POA: Diagnosis not present

## 2019-07-19 DIAGNOSIS — N2581 Secondary hyperparathyroidism of renal origin: Secondary | ICD-10-CM | POA: Diagnosis not present

## 2019-07-19 DIAGNOSIS — T82818A Embolism of vascular prosthetic devices, implants and grafts, initial encounter: Secondary | ICD-10-CM | POA: Diagnosis not present

## 2019-07-19 DIAGNOSIS — D631 Anemia in chronic kidney disease: Secondary | ICD-10-CM | POA: Diagnosis not present

## 2019-07-19 DIAGNOSIS — Z992 Dependence on renal dialysis: Secondary | ICD-10-CM | POA: Diagnosis not present

## 2019-07-19 DIAGNOSIS — N186 End stage renal disease: Secondary | ICD-10-CM | POA: Diagnosis not present

## 2019-07-19 DIAGNOSIS — D689 Coagulation defect, unspecified: Secondary | ICD-10-CM | POA: Diagnosis not present

## 2019-07-22 DIAGNOSIS — N186 End stage renal disease: Secondary | ICD-10-CM | POA: Diagnosis not present

## 2019-07-22 DIAGNOSIS — Z992 Dependence on renal dialysis: Secondary | ICD-10-CM | POA: Diagnosis not present

## 2019-07-22 DIAGNOSIS — N2581 Secondary hyperparathyroidism of renal origin: Secondary | ICD-10-CM | POA: Diagnosis not present

## 2019-07-22 DIAGNOSIS — T82818A Embolism of vascular prosthetic devices, implants and grafts, initial encounter: Secondary | ICD-10-CM | POA: Diagnosis not present

## 2019-07-22 DIAGNOSIS — D689 Coagulation defect, unspecified: Secondary | ICD-10-CM | POA: Diagnosis not present

## 2019-07-22 DIAGNOSIS — D631 Anemia in chronic kidney disease: Secondary | ICD-10-CM | POA: Diagnosis not present

## 2019-07-24 DIAGNOSIS — Z992 Dependence on renal dialysis: Secondary | ICD-10-CM | POA: Diagnosis not present

## 2019-07-24 DIAGNOSIS — N186 End stage renal disease: Secondary | ICD-10-CM | POA: Diagnosis not present

## 2019-07-24 DIAGNOSIS — T82818A Embolism of vascular prosthetic devices, implants and grafts, initial encounter: Secondary | ICD-10-CM | POA: Diagnosis not present

## 2019-07-24 DIAGNOSIS — D631 Anemia in chronic kidney disease: Secondary | ICD-10-CM | POA: Diagnosis not present

## 2019-07-24 DIAGNOSIS — D689 Coagulation defect, unspecified: Secondary | ICD-10-CM | POA: Diagnosis not present

## 2019-07-24 DIAGNOSIS — T8612 Kidney transplant failure: Secondary | ICD-10-CM | POA: Diagnosis not present

## 2019-07-24 DIAGNOSIS — N2581 Secondary hyperparathyroidism of renal origin: Secondary | ICD-10-CM | POA: Diagnosis not present

## 2019-07-26 DIAGNOSIS — E1022 Type 1 diabetes mellitus with diabetic chronic kidney disease: Secondary | ICD-10-CM | POA: Diagnosis not present

## 2019-07-26 DIAGNOSIS — D631 Anemia in chronic kidney disease: Secondary | ICD-10-CM | POA: Diagnosis not present

## 2019-07-26 DIAGNOSIS — N186 End stage renal disease: Secondary | ICD-10-CM | POA: Diagnosis not present

## 2019-07-26 DIAGNOSIS — D689 Coagulation defect, unspecified: Secondary | ICD-10-CM | POA: Diagnosis not present

## 2019-07-26 DIAGNOSIS — Z992 Dependence on renal dialysis: Secondary | ICD-10-CM | POA: Diagnosis not present

## 2019-07-26 DIAGNOSIS — T82818A Embolism of vascular prosthetic devices, implants and grafts, initial encounter: Secondary | ICD-10-CM | POA: Diagnosis not present

## 2019-07-26 DIAGNOSIS — N2581 Secondary hyperparathyroidism of renal origin: Secondary | ICD-10-CM | POA: Diagnosis not present

## 2019-07-29 DIAGNOSIS — Z992 Dependence on renal dialysis: Secondary | ICD-10-CM | POA: Diagnosis not present

## 2019-07-29 DIAGNOSIS — D689 Coagulation defect, unspecified: Secondary | ICD-10-CM | POA: Diagnosis not present

## 2019-07-29 DIAGNOSIS — N2581 Secondary hyperparathyroidism of renal origin: Secondary | ICD-10-CM | POA: Diagnosis not present

## 2019-07-29 DIAGNOSIS — E1022 Type 1 diabetes mellitus with diabetic chronic kidney disease: Secondary | ICD-10-CM | POA: Diagnosis not present

## 2019-07-29 DIAGNOSIS — N186 End stage renal disease: Secondary | ICD-10-CM | POA: Diagnosis not present

## 2019-07-29 DIAGNOSIS — D631 Anemia in chronic kidney disease: Secondary | ICD-10-CM | POA: Diagnosis not present

## 2019-07-29 DIAGNOSIS — T82818A Embolism of vascular prosthetic devices, implants and grafts, initial encounter: Secondary | ICD-10-CM | POA: Diagnosis not present

## 2019-07-31 DIAGNOSIS — T82818A Embolism of vascular prosthetic devices, implants and grafts, initial encounter: Secondary | ICD-10-CM | POA: Diagnosis not present

## 2019-07-31 DIAGNOSIS — Z992 Dependence on renal dialysis: Secondary | ICD-10-CM | POA: Diagnosis not present

## 2019-07-31 DIAGNOSIS — N2581 Secondary hyperparathyroidism of renal origin: Secondary | ICD-10-CM | POA: Diagnosis not present

## 2019-07-31 DIAGNOSIS — D631 Anemia in chronic kidney disease: Secondary | ICD-10-CM | POA: Diagnosis not present

## 2019-07-31 DIAGNOSIS — N186 End stage renal disease: Secondary | ICD-10-CM | POA: Diagnosis not present

## 2019-07-31 DIAGNOSIS — D689 Coagulation defect, unspecified: Secondary | ICD-10-CM | POA: Diagnosis not present

## 2019-07-31 DIAGNOSIS — E1022 Type 1 diabetes mellitus with diabetic chronic kidney disease: Secondary | ICD-10-CM | POA: Diagnosis not present

## 2019-08-02 DIAGNOSIS — T82818A Embolism of vascular prosthetic devices, implants and grafts, initial encounter: Secondary | ICD-10-CM | POA: Diagnosis not present

## 2019-08-02 DIAGNOSIS — N2581 Secondary hyperparathyroidism of renal origin: Secondary | ICD-10-CM | POA: Diagnosis not present

## 2019-08-02 DIAGNOSIS — D631 Anemia in chronic kidney disease: Secondary | ICD-10-CM | POA: Diagnosis not present

## 2019-08-02 DIAGNOSIS — E1022 Type 1 diabetes mellitus with diabetic chronic kidney disease: Secondary | ICD-10-CM | POA: Diagnosis not present

## 2019-08-02 DIAGNOSIS — N186 End stage renal disease: Secondary | ICD-10-CM | POA: Diagnosis not present

## 2019-08-02 DIAGNOSIS — D689 Coagulation defect, unspecified: Secondary | ICD-10-CM | POA: Diagnosis not present

## 2019-08-02 DIAGNOSIS — Z992 Dependence on renal dialysis: Secondary | ICD-10-CM | POA: Diagnosis not present

## 2019-08-05 DIAGNOSIS — Z992 Dependence on renal dialysis: Secondary | ICD-10-CM | POA: Diagnosis not present

## 2019-08-05 DIAGNOSIS — T82818A Embolism of vascular prosthetic devices, implants and grafts, initial encounter: Secondary | ICD-10-CM | POA: Diagnosis not present

## 2019-08-05 DIAGNOSIS — E1022 Type 1 diabetes mellitus with diabetic chronic kidney disease: Secondary | ICD-10-CM | POA: Diagnosis not present

## 2019-08-05 DIAGNOSIS — N186 End stage renal disease: Secondary | ICD-10-CM | POA: Diagnosis not present

## 2019-08-05 DIAGNOSIS — D689 Coagulation defect, unspecified: Secondary | ICD-10-CM | POA: Diagnosis not present

## 2019-08-05 DIAGNOSIS — D631 Anemia in chronic kidney disease: Secondary | ICD-10-CM | POA: Diagnosis not present

## 2019-08-05 DIAGNOSIS — N2581 Secondary hyperparathyroidism of renal origin: Secondary | ICD-10-CM | POA: Diagnosis not present

## 2019-08-07 DIAGNOSIS — D689 Coagulation defect, unspecified: Secondary | ICD-10-CM | POA: Diagnosis not present

## 2019-08-07 DIAGNOSIS — E1022 Type 1 diabetes mellitus with diabetic chronic kidney disease: Secondary | ICD-10-CM | POA: Diagnosis not present

## 2019-08-07 DIAGNOSIS — N186 End stage renal disease: Secondary | ICD-10-CM | POA: Diagnosis not present

## 2019-08-07 DIAGNOSIS — D631 Anemia in chronic kidney disease: Secondary | ICD-10-CM | POA: Diagnosis not present

## 2019-08-07 DIAGNOSIS — N2581 Secondary hyperparathyroidism of renal origin: Secondary | ICD-10-CM | POA: Diagnosis not present

## 2019-08-07 DIAGNOSIS — Z992 Dependence on renal dialysis: Secondary | ICD-10-CM | POA: Diagnosis not present

## 2019-08-07 DIAGNOSIS — T82818A Embolism of vascular prosthetic devices, implants and grafts, initial encounter: Secondary | ICD-10-CM | POA: Diagnosis not present

## 2019-08-08 ENCOUNTER — Telehealth: Payer: Self-pay | Admitting: Family Medicine

## 2019-08-08 NOTE — Telephone Encounter (Signed)
error 

## 2019-08-09 DIAGNOSIS — Z992 Dependence on renal dialysis: Secondary | ICD-10-CM | POA: Diagnosis not present

## 2019-08-09 DIAGNOSIS — N186 End stage renal disease: Secondary | ICD-10-CM | POA: Diagnosis not present

## 2019-08-09 DIAGNOSIS — E1022 Type 1 diabetes mellitus with diabetic chronic kidney disease: Secondary | ICD-10-CM | POA: Diagnosis not present

## 2019-08-09 DIAGNOSIS — D631 Anemia in chronic kidney disease: Secondary | ICD-10-CM | POA: Diagnosis not present

## 2019-08-09 DIAGNOSIS — D689 Coagulation defect, unspecified: Secondary | ICD-10-CM | POA: Diagnosis not present

## 2019-08-09 DIAGNOSIS — N2581 Secondary hyperparathyroidism of renal origin: Secondary | ICD-10-CM | POA: Diagnosis not present

## 2019-08-09 DIAGNOSIS — T82818A Embolism of vascular prosthetic devices, implants and grafts, initial encounter: Secondary | ICD-10-CM | POA: Diagnosis not present

## 2019-08-12 DIAGNOSIS — T82818A Embolism of vascular prosthetic devices, implants and grafts, initial encounter: Secondary | ICD-10-CM | POA: Diagnosis not present

## 2019-08-12 DIAGNOSIS — D689 Coagulation defect, unspecified: Secondary | ICD-10-CM | POA: Diagnosis not present

## 2019-08-12 DIAGNOSIS — N186 End stage renal disease: Secondary | ICD-10-CM | POA: Diagnosis not present

## 2019-08-12 DIAGNOSIS — E1022 Type 1 diabetes mellitus with diabetic chronic kidney disease: Secondary | ICD-10-CM | POA: Diagnosis not present

## 2019-08-12 DIAGNOSIS — Z992 Dependence on renal dialysis: Secondary | ICD-10-CM | POA: Diagnosis not present

## 2019-08-12 DIAGNOSIS — D631 Anemia in chronic kidney disease: Secondary | ICD-10-CM | POA: Diagnosis not present

## 2019-08-12 DIAGNOSIS — N2581 Secondary hyperparathyroidism of renal origin: Secondary | ICD-10-CM | POA: Diagnosis not present

## 2019-08-14 DIAGNOSIS — N186 End stage renal disease: Secondary | ICD-10-CM | POA: Diagnosis not present

## 2019-08-14 DIAGNOSIS — N2581 Secondary hyperparathyroidism of renal origin: Secondary | ICD-10-CM | POA: Diagnosis not present

## 2019-08-14 DIAGNOSIS — Z992 Dependence on renal dialysis: Secondary | ICD-10-CM | POA: Diagnosis not present

## 2019-08-14 DIAGNOSIS — E1022 Type 1 diabetes mellitus with diabetic chronic kidney disease: Secondary | ICD-10-CM | POA: Diagnosis not present

## 2019-08-14 DIAGNOSIS — D689 Coagulation defect, unspecified: Secondary | ICD-10-CM | POA: Diagnosis not present

## 2019-08-14 DIAGNOSIS — D631 Anemia in chronic kidney disease: Secondary | ICD-10-CM | POA: Diagnosis not present

## 2019-08-14 DIAGNOSIS — T82818A Embolism of vascular prosthetic devices, implants and grafts, initial encounter: Secondary | ICD-10-CM | POA: Diagnosis not present

## 2019-08-16 DIAGNOSIS — N2581 Secondary hyperparathyroidism of renal origin: Secondary | ICD-10-CM | POA: Diagnosis not present

## 2019-08-16 DIAGNOSIS — D689 Coagulation defect, unspecified: Secondary | ICD-10-CM | POA: Diagnosis not present

## 2019-08-16 DIAGNOSIS — E1022 Type 1 diabetes mellitus with diabetic chronic kidney disease: Secondary | ICD-10-CM | POA: Diagnosis not present

## 2019-08-16 DIAGNOSIS — Z992 Dependence on renal dialysis: Secondary | ICD-10-CM | POA: Diagnosis not present

## 2019-08-16 DIAGNOSIS — T82818A Embolism of vascular prosthetic devices, implants and grafts, initial encounter: Secondary | ICD-10-CM | POA: Diagnosis not present

## 2019-08-16 DIAGNOSIS — N186 End stage renal disease: Secondary | ICD-10-CM | POA: Diagnosis not present

## 2019-08-16 DIAGNOSIS — D631 Anemia in chronic kidney disease: Secondary | ICD-10-CM | POA: Diagnosis not present

## 2019-08-19 DIAGNOSIS — N2581 Secondary hyperparathyroidism of renal origin: Secondary | ICD-10-CM | POA: Diagnosis not present

## 2019-08-19 DIAGNOSIS — T82818A Embolism of vascular prosthetic devices, implants and grafts, initial encounter: Secondary | ICD-10-CM | POA: Diagnosis not present

## 2019-08-19 DIAGNOSIS — Z992 Dependence on renal dialysis: Secondary | ICD-10-CM | POA: Diagnosis not present

## 2019-08-19 DIAGNOSIS — E1022 Type 1 diabetes mellitus with diabetic chronic kidney disease: Secondary | ICD-10-CM | POA: Diagnosis not present

## 2019-08-19 DIAGNOSIS — D689 Coagulation defect, unspecified: Secondary | ICD-10-CM | POA: Diagnosis not present

## 2019-08-19 DIAGNOSIS — D631 Anemia in chronic kidney disease: Secondary | ICD-10-CM | POA: Diagnosis not present

## 2019-08-19 DIAGNOSIS — N186 End stage renal disease: Secondary | ICD-10-CM | POA: Diagnosis not present

## 2019-08-20 DIAGNOSIS — Z992 Dependence on renal dialysis: Secondary | ICD-10-CM | POA: Diagnosis not present

## 2019-08-20 DIAGNOSIS — N186 End stage renal disease: Secondary | ICD-10-CM | POA: Diagnosis not present

## 2019-08-20 DIAGNOSIS — T82898A Other specified complication of vascular prosthetic devices, implants and grafts, initial encounter: Secondary | ICD-10-CM | POA: Diagnosis not present

## 2019-08-20 DIAGNOSIS — I871 Compression of vein: Secondary | ICD-10-CM | POA: Diagnosis not present

## 2019-08-21 DIAGNOSIS — T82818A Embolism of vascular prosthetic devices, implants and grafts, initial encounter: Secondary | ICD-10-CM | POA: Diagnosis not present

## 2019-08-21 DIAGNOSIS — N2581 Secondary hyperparathyroidism of renal origin: Secondary | ICD-10-CM | POA: Diagnosis not present

## 2019-08-21 DIAGNOSIS — D689 Coagulation defect, unspecified: Secondary | ICD-10-CM | POA: Diagnosis not present

## 2019-08-21 DIAGNOSIS — D631 Anemia in chronic kidney disease: Secondary | ICD-10-CM | POA: Diagnosis not present

## 2019-08-21 DIAGNOSIS — N186 End stage renal disease: Secondary | ICD-10-CM | POA: Diagnosis not present

## 2019-08-21 DIAGNOSIS — Z992 Dependence on renal dialysis: Secondary | ICD-10-CM | POA: Diagnosis not present

## 2019-08-21 DIAGNOSIS — E1022 Type 1 diabetes mellitus with diabetic chronic kidney disease: Secondary | ICD-10-CM | POA: Diagnosis not present

## 2019-08-23 DIAGNOSIS — Z992 Dependence on renal dialysis: Secondary | ICD-10-CM | POA: Diagnosis not present

## 2019-08-23 DIAGNOSIS — N186 End stage renal disease: Secondary | ICD-10-CM | POA: Diagnosis not present

## 2019-08-23 DIAGNOSIS — D631 Anemia in chronic kidney disease: Secondary | ICD-10-CM | POA: Diagnosis not present

## 2019-08-23 DIAGNOSIS — T8612 Kidney transplant failure: Secondary | ICD-10-CM | POA: Diagnosis not present

## 2019-08-23 DIAGNOSIS — N2581 Secondary hyperparathyroidism of renal origin: Secondary | ICD-10-CM | POA: Diagnosis not present

## 2019-08-23 DIAGNOSIS — E1022 Type 1 diabetes mellitus with diabetic chronic kidney disease: Secondary | ICD-10-CM | POA: Diagnosis not present

## 2019-08-23 DIAGNOSIS — T82818A Embolism of vascular prosthetic devices, implants and grafts, initial encounter: Secondary | ICD-10-CM | POA: Diagnosis not present

## 2019-08-23 DIAGNOSIS — D689 Coagulation defect, unspecified: Secondary | ICD-10-CM | POA: Diagnosis not present

## 2019-08-26 DIAGNOSIS — E1022 Type 1 diabetes mellitus with diabetic chronic kidney disease: Secondary | ICD-10-CM | POA: Diagnosis not present

## 2019-08-26 DIAGNOSIS — N2581 Secondary hyperparathyroidism of renal origin: Secondary | ICD-10-CM | POA: Diagnosis not present

## 2019-08-26 DIAGNOSIS — D631 Anemia in chronic kidney disease: Secondary | ICD-10-CM | POA: Diagnosis not present

## 2019-08-26 DIAGNOSIS — N186 End stage renal disease: Secondary | ICD-10-CM | POA: Diagnosis not present

## 2019-08-26 DIAGNOSIS — T82818A Embolism of vascular prosthetic devices, implants and grafts, initial encounter: Secondary | ICD-10-CM | POA: Diagnosis not present

## 2019-08-26 DIAGNOSIS — Z992 Dependence on renal dialysis: Secondary | ICD-10-CM | POA: Diagnosis not present

## 2019-08-26 DIAGNOSIS — D689 Coagulation defect, unspecified: Secondary | ICD-10-CM | POA: Diagnosis not present

## 2019-08-26 DIAGNOSIS — R197 Diarrhea, unspecified: Secondary | ICD-10-CM | POA: Diagnosis not present

## 2019-08-27 DIAGNOSIS — E291 Testicular hypofunction: Secondary | ICD-10-CM | POA: Diagnosis not present

## 2019-08-27 DIAGNOSIS — E1022 Type 1 diabetes mellitus with diabetic chronic kidney disease: Secondary | ICD-10-CM | POA: Diagnosis not present

## 2019-08-27 DIAGNOSIS — Z992 Dependence on renal dialysis: Secondary | ICD-10-CM | POA: Diagnosis not present

## 2019-08-27 DIAGNOSIS — E782 Mixed hyperlipidemia: Secondary | ICD-10-CM | POA: Diagnosis not present

## 2019-08-27 DIAGNOSIS — F17210 Nicotine dependence, cigarettes, uncomplicated: Secondary | ICD-10-CM | POA: Diagnosis not present

## 2019-08-27 DIAGNOSIS — E039 Hypothyroidism, unspecified: Secondary | ICD-10-CM | POA: Diagnosis not present

## 2019-08-27 DIAGNOSIS — E785 Hyperlipidemia, unspecified: Secondary | ICD-10-CM | POA: Diagnosis not present

## 2019-08-27 DIAGNOSIS — N186 End stage renal disease: Secondary | ICD-10-CM | POA: Diagnosis not present

## 2019-08-28 DIAGNOSIS — T82818A Embolism of vascular prosthetic devices, implants and grafts, initial encounter: Secondary | ICD-10-CM | POA: Diagnosis not present

## 2019-08-28 DIAGNOSIS — N2581 Secondary hyperparathyroidism of renal origin: Secondary | ICD-10-CM | POA: Diagnosis not present

## 2019-08-28 DIAGNOSIS — E1022 Type 1 diabetes mellitus with diabetic chronic kidney disease: Secondary | ICD-10-CM | POA: Diagnosis not present

## 2019-08-28 DIAGNOSIS — Z992 Dependence on renal dialysis: Secondary | ICD-10-CM | POA: Diagnosis not present

## 2019-08-28 DIAGNOSIS — R197 Diarrhea, unspecified: Secondary | ICD-10-CM | POA: Diagnosis not present

## 2019-08-28 DIAGNOSIS — N186 End stage renal disease: Secondary | ICD-10-CM | POA: Diagnosis not present

## 2019-08-28 DIAGNOSIS — D631 Anemia in chronic kidney disease: Secondary | ICD-10-CM | POA: Diagnosis not present

## 2019-08-28 DIAGNOSIS — D689 Coagulation defect, unspecified: Secondary | ICD-10-CM | POA: Diagnosis not present

## 2019-08-30 DIAGNOSIS — N186 End stage renal disease: Secondary | ICD-10-CM | POA: Diagnosis not present

## 2019-08-30 DIAGNOSIS — R197 Diarrhea, unspecified: Secondary | ICD-10-CM | POA: Diagnosis not present

## 2019-08-30 DIAGNOSIS — E1022 Type 1 diabetes mellitus with diabetic chronic kidney disease: Secondary | ICD-10-CM | POA: Diagnosis not present

## 2019-08-30 DIAGNOSIS — N2581 Secondary hyperparathyroidism of renal origin: Secondary | ICD-10-CM | POA: Diagnosis not present

## 2019-08-30 DIAGNOSIS — Z992 Dependence on renal dialysis: Secondary | ICD-10-CM | POA: Diagnosis not present

## 2019-08-30 DIAGNOSIS — T82818A Embolism of vascular prosthetic devices, implants and grafts, initial encounter: Secondary | ICD-10-CM | POA: Diagnosis not present

## 2019-08-30 DIAGNOSIS — D689 Coagulation defect, unspecified: Secondary | ICD-10-CM | POA: Diagnosis not present

## 2019-08-30 DIAGNOSIS — D631 Anemia in chronic kidney disease: Secondary | ICD-10-CM | POA: Diagnosis not present

## 2019-09-02 DIAGNOSIS — R197 Diarrhea, unspecified: Secondary | ICD-10-CM | POA: Diagnosis not present

## 2019-09-02 DIAGNOSIS — E1022 Type 1 diabetes mellitus with diabetic chronic kidney disease: Secondary | ICD-10-CM | POA: Diagnosis not present

## 2019-09-02 DIAGNOSIS — N186 End stage renal disease: Secondary | ICD-10-CM | POA: Diagnosis not present

## 2019-09-02 DIAGNOSIS — T82818A Embolism of vascular prosthetic devices, implants and grafts, initial encounter: Secondary | ICD-10-CM | POA: Diagnosis not present

## 2019-09-02 DIAGNOSIS — Z992 Dependence on renal dialysis: Secondary | ICD-10-CM | POA: Diagnosis not present

## 2019-09-02 DIAGNOSIS — D631 Anemia in chronic kidney disease: Secondary | ICD-10-CM | POA: Diagnosis not present

## 2019-09-02 DIAGNOSIS — N2581 Secondary hyperparathyroidism of renal origin: Secondary | ICD-10-CM | POA: Diagnosis not present

## 2019-09-02 DIAGNOSIS — D689 Coagulation defect, unspecified: Secondary | ICD-10-CM | POA: Diagnosis not present

## 2019-09-04 DIAGNOSIS — R197 Diarrhea, unspecified: Secondary | ICD-10-CM | POA: Diagnosis not present

## 2019-09-04 DIAGNOSIS — E1022 Type 1 diabetes mellitus with diabetic chronic kidney disease: Secondary | ICD-10-CM | POA: Diagnosis not present

## 2019-09-04 DIAGNOSIS — T82818A Embolism of vascular prosthetic devices, implants and grafts, initial encounter: Secondary | ICD-10-CM | POA: Diagnosis not present

## 2019-09-04 DIAGNOSIS — N2581 Secondary hyperparathyroidism of renal origin: Secondary | ICD-10-CM | POA: Diagnosis not present

## 2019-09-04 DIAGNOSIS — Z992 Dependence on renal dialysis: Secondary | ICD-10-CM | POA: Diagnosis not present

## 2019-09-04 DIAGNOSIS — N186 End stage renal disease: Secondary | ICD-10-CM | POA: Diagnosis not present

## 2019-09-04 DIAGNOSIS — D631 Anemia in chronic kidney disease: Secondary | ICD-10-CM | POA: Diagnosis not present

## 2019-09-04 DIAGNOSIS — D689 Coagulation defect, unspecified: Secondary | ICD-10-CM | POA: Diagnosis not present

## 2019-09-06 DIAGNOSIS — Z992 Dependence on renal dialysis: Secondary | ICD-10-CM | POA: Diagnosis not present

## 2019-09-06 DIAGNOSIS — N186 End stage renal disease: Secondary | ICD-10-CM | POA: Diagnosis not present

## 2019-09-06 DIAGNOSIS — E1022 Type 1 diabetes mellitus with diabetic chronic kidney disease: Secondary | ICD-10-CM | POA: Diagnosis not present

## 2019-09-06 DIAGNOSIS — T82818A Embolism of vascular prosthetic devices, implants and grafts, initial encounter: Secondary | ICD-10-CM | POA: Diagnosis not present

## 2019-09-06 DIAGNOSIS — D631 Anemia in chronic kidney disease: Secondary | ICD-10-CM | POA: Diagnosis not present

## 2019-09-06 DIAGNOSIS — N2581 Secondary hyperparathyroidism of renal origin: Secondary | ICD-10-CM | POA: Diagnosis not present

## 2019-09-06 DIAGNOSIS — R197 Diarrhea, unspecified: Secondary | ICD-10-CM | POA: Diagnosis not present

## 2019-09-06 DIAGNOSIS — D689 Coagulation defect, unspecified: Secondary | ICD-10-CM | POA: Diagnosis not present

## 2019-09-09 DIAGNOSIS — Z992 Dependence on renal dialysis: Secondary | ICD-10-CM | POA: Diagnosis not present

## 2019-09-09 DIAGNOSIS — N2581 Secondary hyperparathyroidism of renal origin: Secondary | ICD-10-CM | POA: Diagnosis not present

## 2019-09-09 DIAGNOSIS — E1022 Type 1 diabetes mellitus with diabetic chronic kidney disease: Secondary | ICD-10-CM | POA: Diagnosis not present

## 2019-09-09 DIAGNOSIS — D631 Anemia in chronic kidney disease: Secondary | ICD-10-CM | POA: Diagnosis not present

## 2019-09-09 DIAGNOSIS — T82818A Embolism of vascular prosthetic devices, implants and grafts, initial encounter: Secondary | ICD-10-CM | POA: Diagnosis not present

## 2019-09-09 DIAGNOSIS — D689 Coagulation defect, unspecified: Secondary | ICD-10-CM | POA: Diagnosis not present

## 2019-09-09 DIAGNOSIS — N186 End stage renal disease: Secondary | ICD-10-CM | POA: Diagnosis not present

## 2019-09-09 DIAGNOSIS — R197 Diarrhea, unspecified: Secondary | ICD-10-CM | POA: Diagnosis not present

## 2019-09-11 DIAGNOSIS — E108 Type 1 diabetes mellitus with unspecified complications: Secondary | ICD-10-CM | POA: Diagnosis not present

## 2019-09-11 DIAGNOSIS — E1022 Type 1 diabetes mellitus with diabetic chronic kidney disease: Secondary | ICD-10-CM | POA: Diagnosis not present

## 2019-09-11 DIAGNOSIS — D689 Coagulation defect, unspecified: Secondary | ICD-10-CM | POA: Diagnosis not present

## 2019-09-11 DIAGNOSIS — Z794 Long term (current) use of insulin: Secondary | ICD-10-CM | POA: Diagnosis not present

## 2019-09-11 DIAGNOSIS — D631 Anemia in chronic kidney disease: Secondary | ICD-10-CM | POA: Diagnosis not present

## 2019-09-11 DIAGNOSIS — T82818A Embolism of vascular prosthetic devices, implants and grafts, initial encounter: Secondary | ICD-10-CM | POA: Diagnosis not present

## 2019-09-11 DIAGNOSIS — R197 Diarrhea, unspecified: Secondary | ICD-10-CM | POA: Diagnosis not present

## 2019-09-11 DIAGNOSIS — Z992 Dependence on renal dialysis: Secondary | ICD-10-CM | POA: Diagnosis not present

## 2019-09-11 DIAGNOSIS — E1059 Type 1 diabetes mellitus with other circulatory complications: Secondary | ICD-10-CM | POA: Diagnosis not present

## 2019-09-11 DIAGNOSIS — N2581 Secondary hyperparathyroidism of renal origin: Secondary | ICD-10-CM | POA: Diagnosis not present

## 2019-09-11 DIAGNOSIS — N186 End stage renal disease: Secondary | ICD-10-CM | POA: Diagnosis not present

## 2019-09-11 DIAGNOSIS — E1040 Type 1 diabetes mellitus with diabetic neuropathy, unspecified: Secondary | ICD-10-CM | POA: Diagnosis not present

## 2019-09-13 DIAGNOSIS — Z992 Dependence on renal dialysis: Secondary | ICD-10-CM | POA: Diagnosis not present

## 2019-09-13 DIAGNOSIS — D631 Anemia in chronic kidney disease: Secondary | ICD-10-CM | POA: Diagnosis not present

## 2019-09-13 DIAGNOSIS — N2581 Secondary hyperparathyroidism of renal origin: Secondary | ICD-10-CM | POA: Diagnosis not present

## 2019-09-13 DIAGNOSIS — N186 End stage renal disease: Secondary | ICD-10-CM | POA: Diagnosis not present

## 2019-09-13 DIAGNOSIS — E1022 Type 1 diabetes mellitus with diabetic chronic kidney disease: Secondary | ICD-10-CM | POA: Diagnosis not present

## 2019-09-13 DIAGNOSIS — D689 Coagulation defect, unspecified: Secondary | ICD-10-CM | POA: Diagnosis not present

## 2019-09-13 DIAGNOSIS — T82818A Embolism of vascular prosthetic devices, implants and grafts, initial encounter: Secondary | ICD-10-CM | POA: Diagnosis not present

## 2019-09-13 DIAGNOSIS — R197 Diarrhea, unspecified: Secondary | ICD-10-CM | POA: Diagnosis not present

## 2019-09-16 DIAGNOSIS — T82818A Embolism of vascular prosthetic devices, implants and grafts, initial encounter: Secondary | ICD-10-CM | POA: Diagnosis not present

## 2019-09-16 DIAGNOSIS — Z992 Dependence on renal dialysis: Secondary | ICD-10-CM | POA: Diagnosis not present

## 2019-09-16 DIAGNOSIS — R197 Diarrhea, unspecified: Secondary | ICD-10-CM | POA: Diagnosis not present

## 2019-09-16 DIAGNOSIS — E1022 Type 1 diabetes mellitus with diabetic chronic kidney disease: Secondary | ICD-10-CM | POA: Diagnosis not present

## 2019-09-16 DIAGNOSIS — N186 End stage renal disease: Secondary | ICD-10-CM | POA: Diagnosis not present

## 2019-09-16 DIAGNOSIS — D631 Anemia in chronic kidney disease: Secondary | ICD-10-CM | POA: Diagnosis not present

## 2019-09-16 DIAGNOSIS — D689 Coagulation defect, unspecified: Secondary | ICD-10-CM | POA: Diagnosis not present

## 2019-09-16 DIAGNOSIS — N2581 Secondary hyperparathyroidism of renal origin: Secondary | ICD-10-CM | POA: Diagnosis not present

## 2019-09-18 DIAGNOSIS — R197 Diarrhea, unspecified: Secondary | ICD-10-CM | POA: Diagnosis not present

## 2019-09-18 DIAGNOSIS — T82818A Embolism of vascular prosthetic devices, implants and grafts, initial encounter: Secondary | ICD-10-CM | POA: Diagnosis not present

## 2019-09-18 DIAGNOSIS — N2581 Secondary hyperparathyroidism of renal origin: Secondary | ICD-10-CM | POA: Diagnosis not present

## 2019-09-18 DIAGNOSIS — D631 Anemia in chronic kidney disease: Secondary | ICD-10-CM | POA: Diagnosis not present

## 2019-09-18 DIAGNOSIS — E1022 Type 1 diabetes mellitus with diabetic chronic kidney disease: Secondary | ICD-10-CM | POA: Diagnosis not present

## 2019-09-18 DIAGNOSIS — N186 End stage renal disease: Secondary | ICD-10-CM | POA: Diagnosis not present

## 2019-09-18 DIAGNOSIS — D689 Coagulation defect, unspecified: Secondary | ICD-10-CM | POA: Diagnosis not present

## 2019-09-18 DIAGNOSIS — Z992 Dependence on renal dialysis: Secondary | ICD-10-CM | POA: Diagnosis not present

## 2019-09-20 DIAGNOSIS — D689 Coagulation defect, unspecified: Secondary | ICD-10-CM | POA: Diagnosis not present

## 2019-09-20 DIAGNOSIS — N186 End stage renal disease: Secondary | ICD-10-CM | POA: Diagnosis not present

## 2019-09-20 DIAGNOSIS — R197 Diarrhea, unspecified: Secondary | ICD-10-CM | POA: Diagnosis not present

## 2019-09-20 DIAGNOSIS — N2581 Secondary hyperparathyroidism of renal origin: Secondary | ICD-10-CM | POA: Diagnosis not present

## 2019-09-20 DIAGNOSIS — T82818A Embolism of vascular prosthetic devices, implants and grafts, initial encounter: Secondary | ICD-10-CM | POA: Diagnosis not present

## 2019-09-20 DIAGNOSIS — Z992 Dependence on renal dialysis: Secondary | ICD-10-CM | POA: Diagnosis not present

## 2019-09-20 DIAGNOSIS — E1022 Type 1 diabetes mellitus with diabetic chronic kidney disease: Secondary | ICD-10-CM | POA: Diagnosis not present

## 2019-09-20 DIAGNOSIS — D631 Anemia in chronic kidney disease: Secondary | ICD-10-CM | POA: Diagnosis not present

## 2019-09-23 DIAGNOSIS — T8612 Kidney transplant failure: Secondary | ICD-10-CM | POA: Diagnosis not present

## 2019-09-23 DIAGNOSIS — D689 Coagulation defect, unspecified: Secondary | ICD-10-CM | POA: Diagnosis not present

## 2019-09-23 DIAGNOSIS — Z992 Dependence on renal dialysis: Secondary | ICD-10-CM | POA: Diagnosis not present

## 2019-09-23 DIAGNOSIS — D631 Anemia in chronic kidney disease: Secondary | ICD-10-CM | POA: Diagnosis not present

## 2019-09-23 DIAGNOSIS — N2581 Secondary hyperparathyroidism of renal origin: Secondary | ICD-10-CM | POA: Diagnosis not present

## 2019-09-23 DIAGNOSIS — N186 End stage renal disease: Secondary | ICD-10-CM | POA: Diagnosis not present

## 2019-09-23 DIAGNOSIS — E1022 Type 1 diabetes mellitus with diabetic chronic kidney disease: Secondary | ICD-10-CM | POA: Diagnosis not present

## 2019-09-23 DIAGNOSIS — R197 Diarrhea, unspecified: Secondary | ICD-10-CM | POA: Diagnosis not present

## 2019-09-23 DIAGNOSIS — T82818A Embolism of vascular prosthetic devices, implants and grafts, initial encounter: Secondary | ICD-10-CM | POA: Diagnosis not present

## 2019-09-25 DIAGNOSIS — Z992 Dependence on renal dialysis: Secondary | ICD-10-CM | POA: Diagnosis not present

## 2019-09-25 DIAGNOSIS — E1022 Type 1 diabetes mellitus with diabetic chronic kidney disease: Secondary | ICD-10-CM | POA: Diagnosis not present

## 2019-09-25 DIAGNOSIS — D689 Coagulation defect, unspecified: Secondary | ICD-10-CM | POA: Diagnosis not present

## 2019-09-25 DIAGNOSIS — D631 Anemia in chronic kidney disease: Secondary | ICD-10-CM | POA: Diagnosis not present

## 2019-09-25 DIAGNOSIS — N2581 Secondary hyperparathyroidism of renal origin: Secondary | ICD-10-CM | POA: Diagnosis not present

## 2019-09-25 DIAGNOSIS — T82818A Embolism of vascular prosthetic devices, implants and grafts, initial encounter: Secondary | ICD-10-CM | POA: Diagnosis not present

## 2019-09-25 DIAGNOSIS — N186 End stage renal disease: Secondary | ICD-10-CM | POA: Diagnosis not present

## 2019-09-27 DIAGNOSIS — D631 Anemia in chronic kidney disease: Secondary | ICD-10-CM | POA: Diagnosis not present

## 2019-09-27 DIAGNOSIS — Z992 Dependence on renal dialysis: Secondary | ICD-10-CM | POA: Diagnosis not present

## 2019-09-27 DIAGNOSIS — N186 End stage renal disease: Secondary | ICD-10-CM | POA: Diagnosis not present

## 2019-09-27 DIAGNOSIS — E1022 Type 1 diabetes mellitus with diabetic chronic kidney disease: Secondary | ICD-10-CM | POA: Diagnosis not present

## 2019-09-27 DIAGNOSIS — N2581 Secondary hyperparathyroidism of renal origin: Secondary | ICD-10-CM | POA: Diagnosis not present

## 2019-09-27 DIAGNOSIS — D689 Coagulation defect, unspecified: Secondary | ICD-10-CM | POA: Diagnosis not present

## 2019-09-27 DIAGNOSIS — T82818A Embolism of vascular prosthetic devices, implants and grafts, initial encounter: Secondary | ICD-10-CM | POA: Diagnosis not present

## 2019-09-30 DIAGNOSIS — T82818A Embolism of vascular prosthetic devices, implants and grafts, initial encounter: Secondary | ICD-10-CM | POA: Diagnosis not present

## 2019-09-30 DIAGNOSIS — N2581 Secondary hyperparathyroidism of renal origin: Secondary | ICD-10-CM | POA: Diagnosis not present

## 2019-09-30 DIAGNOSIS — E1022 Type 1 diabetes mellitus with diabetic chronic kidney disease: Secondary | ICD-10-CM | POA: Diagnosis not present

## 2019-09-30 DIAGNOSIS — N186 End stage renal disease: Secondary | ICD-10-CM | POA: Diagnosis not present

## 2019-09-30 DIAGNOSIS — D689 Coagulation defect, unspecified: Secondary | ICD-10-CM | POA: Diagnosis not present

## 2019-09-30 DIAGNOSIS — D631 Anemia in chronic kidney disease: Secondary | ICD-10-CM | POA: Diagnosis not present

## 2019-09-30 DIAGNOSIS — Z992 Dependence on renal dialysis: Secondary | ICD-10-CM | POA: Diagnosis not present

## 2019-10-02 DIAGNOSIS — D631 Anemia in chronic kidney disease: Secondary | ICD-10-CM | POA: Diagnosis not present

## 2019-10-02 DIAGNOSIS — E1022 Type 1 diabetes mellitus with diabetic chronic kidney disease: Secondary | ICD-10-CM | POA: Diagnosis not present

## 2019-10-02 DIAGNOSIS — N186 End stage renal disease: Secondary | ICD-10-CM | POA: Diagnosis not present

## 2019-10-02 DIAGNOSIS — D689 Coagulation defect, unspecified: Secondary | ICD-10-CM | POA: Diagnosis not present

## 2019-10-02 DIAGNOSIS — Z992 Dependence on renal dialysis: Secondary | ICD-10-CM | POA: Diagnosis not present

## 2019-10-02 DIAGNOSIS — N2581 Secondary hyperparathyroidism of renal origin: Secondary | ICD-10-CM | POA: Diagnosis not present

## 2019-10-02 DIAGNOSIS — T82818A Embolism of vascular prosthetic devices, implants and grafts, initial encounter: Secondary | ICD-10-CM | POA: Diagnosis not present

## 2019-10-04 DIAGNOSIS — D689 Coagulation defect, unspecified: Secondary | ICD-10-CM | POA: Diagnosis not present

## 2019-10-04 DIAGNOSIS — E1022 Type 1 diabetes mellitus with diabetic chronic kidney disease: Secondary | ICD-10-CM | POA: Diagnosis not present

## 2019-10-04 DIAGNOSIS — N2581 Secondary hyperparathyroidism of renal origin: Secondary | ICD-10-CM | POA: Diagnosis not present

## 2019-10-04 DIAGNOSIS — D631 Anemia in chronic kidney disease: Secondary | ICD-10-CM | POA: Diagnosis not present

## 2019-10-04 DIAGNOSIS — N186 End stage renal disease: Secondary | ICD-10-CM | POA: Diagnosis not present

## 2019-10-04 DIAGNOSIS — T82818A Embolism of vascular prosthetic devices, implants and grafts, initial encounter: Secondary | ICD-10-CM | POA: Diagnosis not present

## 2019-10-04 DIAGNOSIS — Z992 Dependence on renal dialysis: Secondary | ICD-10-CM | POA: Diagnosis not present

## 2019-10-07 DIAGNOSIS — N2581 Secondary hyperparathyroidism of renal origin: Secondary | ICD-10-CM | POA: Diagnosis not present

## 2019-10-07 DIAGNOSIS — D689 Coagulation defect, unspecified: Secondary | ICD-10-CM | POA: Diagnosis not present

## 2019-10-07 DIAGNOSIS — N186 End stage renal disease: Secondary | ICD-10-CM | POA: Diagnosis not present

## 2019-10-07 DIAGNOSIS — T82818A Embolism of vascular prosthetic devices, implants and grafts, initial encounter: Secondary | ICD-10-CM | POA: Diagnosis not present

## 2019-10-07 DIAGNOSIS — Z992 Dependence on renal dialysis: Secondary | ICD-10-CM | POA: Diagnosis not present

## 2019-10-07 DIAGNOSIS — D631 Anemia in chronic kidney disease: Secondary | ICD-10-CM | POA: Diagnosis not present

## 2019-10-07 DIAGNOSIS — E1022 Type 1 diabetes mellitus with diabetic chronic kidney disease: Secondary | ICD-10-CM | POA: Diagnosis not present

## 2019-10-09 DIAGNOSIS — T82818A Embolism of vascular prosthetic devices, implants and grafts, initial encounter: Secondary | ICD-10-CM | POA: Diagnosis not present

## 2019-10-09 DIAGNOSIS — E1022 Type 1 diabetes mellitus with diabetic chronic kidney disease: Secondary | ICD-10-CM | POA: Diagnosis not present

## 2019-10-09 DIAGNOSIS — N186 End stage renal disease: Secondary | ICD-10-CM | POA: Diagnosis not present

## 2019-10-09 DIAGNOSIS — D631 Anemia in chronic kidney disease: Secondary | ICD-10-CM | POA: Diagnosis not present

## 2019-10-09 DIAGNOSIS — D689 Coagulation defect, unspecified: Secondary | ICD-10-CM | POA: Diagnosis not present

## 2019-10-09 DIAGNOSIS — N2581 Secondary hyperparathyroidism of renal origin: Secondary | ICD-10-CM | POA: Diagnosis not present

## 2019-10-09 DIAGNOSIS — Z992 Dependence on renal dialysis: Secondary | ICD-10-CM | POA: Diagnosis not present

## 2019-10-11 DIAGNOSIS — N2581 Secondary hyperparathyroidism of renal origin: Secondary | ICD-10-CM | POA: Diagnosis not present

## 2019-10-11 DIAGNOSIS — E1022 Type 1 diabetes mellitus with diabetic chronic kidney disease: Secondary | ICD-10-CM | POA: Diagnosis not present

## 2019-10-11 DIAGNOSIS — D689 Coagulation defect, unspecified: Secondary | ICD-10-CM | POA: Diagnosis not present

## 2019-10-11 DIAGNOSIS — D631 Anemia in chronic kidney disease: Secondary | ICD-10-CM | POA: Diagnosis not present

## 2019-10-11 DIAGNOSIS — Z992 Dependence on renal dialysis: Secondary | ICD-10-CM | POA: Diagnosis not present

## 2019-10-11 DIAGNOSIS — T82818A Embolism of vascular prosthetic devices, implants and grafts, initial encounter: Secondary | ICD-10-CM | POA: Diagnosis not present

## 2019-10-11 DIAGNOSIS — N186 End stage renal disease: Secondary | ICD-10-CM | POA: Diagnosis not present

## 2019-10-14 DIAGNOSIS — M2550 Pain in unspecified joint: Secondary | ICD-10-CM | POA: Diagnosis not present

## 2019-10-14 DIAGNOSIS — N186 End stage renal disease: Secondary | ICD-10-CM | POA: Diagnosis not present

## 2019-10-14 DIAGNOSIS — D689 Coagulation defect, unspecified: Secondary | ICD-10-CM | POA: Diagnosis not present

## 2019-10-14 DIAGNOSIS — N2581 Secondary hyperparathyroidism of renal origin: Secondary | ICD-10-CM | POA: Diagnosis not present

## 2019-10-14 DIAGNOSIS — T82818A Embolism of vascular prosthetic devices, implants and grafts, initial encounter: Secondary | ICD-10-CM | POA: Diagnosis not present

## 2019-10-14 DIAGNOSIS — D631 Anemia in chronic kidney disease: Secondary | ICD-10-CM | POA: Diagnosis not present

## 2019-10-14 DIAGNOSIS — E1022 Type 1 diabetes mellitus with diabetic chronic kidney disease: Secondary | ICD-10-CM | POA: Diagnosis not present

## 2019-10-14 DIAGNOSIS — Z992 Dependence on renal dialysis: Secondary | ICD-10-CM | POA: Diagnosis not present

## 2019-10-15 DIAGNOSIS — H35043 Retinal micro-aneurysms, unspecified, bilateral: Secondary | ICD-10-CM | POA: Diagnosis not present

## 2019-10-15 DIAGNOSIS — E103532 Type 1 diabetes mellitus with proliferative diabetic retinopathy with traction retinal detachment not involving the macula, left eye: Secondary | ICD-10-CM | POA: Diagnosis not present

## 2019-10-15 DIAGNOSIS — E103551 Type 1 diabetes mellitus with stable proliferative diabetic retinopathy, right eye: Secondary | ICD-10-CM | POA: Diagnosis not present

## 2019-10-15 DIAGNOSIS — Z794 Long term (current) use of insulin: Secondary | ICD-10-CM | POA: Diagnosis not present

## 2019-10-16 DIAGNOSIS — Z992 Dependence on renal dialysis: Secondary | ICD-10-CM | POA: Diagnosis not present

## 2019-10-16 DIAGNOSIS — E1022 Type 1 diabetes mellitus with diabetic chronic kidney disease: Secondary | ICD-10-CM | POA: Diagnosis not present

## 2019-10-16 DIAGNOSIS — N186 End stage renal disease: Secondary | ICD-10-CM | POA: Diagnosis not present

## 2019-10-16 DIAGNOSIS — N2581 Secondary hyperparathyroidism of renal origin: Secondary | ICD-10-CM | POA: Diagnosis not present

## 2019-10-16 DIAGNOSIS — T82818A Embolism of vascular prosthetic devices, implants and grafts, initial encounter: Secondary | ICD-10-CM | POA: Diagnosis not present

## 2019-10-16 DIAGNOSIS — D631 Anemia in chronic kidney disease: Secondary | ICD-10-CM | POA: Diagnosis not present

## 2019-10-16 DIAGNOSIS — D689 Coagulation defect, unspecified: Secondary | ICD-10-CM | POA: Diagnosis not present

## 2019-10-18 DIAGNOSIS — Z992 Dependence on renal dialysis: Secondary | ICD-10-CM | POA: Diagnosis not present

## 2019-10-18 DIAGNOSIS — E1022 Type 1 diabetes mellitus with diabetic chronic kidney disease: Secondary | ICD-10-CM | POA: Diagnosis not present

## 2019-10-18 DIAGNOSIS — N186 End stage renal disease: Secondary | ICD-10-CM | POA: Diagnosis not present

## 2019-10-18 DIAGNOSIS — D689 Coagulation defect, unspecified: Secondary | ICD-10-CM | POA: Diagnosis not present

## 2019-10-18 DIAGNOSIS — N2581 Secondary hyperparathyroidism of renal origin: Secondary | ICD-10-CM | POA: Diagnosis not present

## 2019-10-18 DIAGNOSIS — T82818A Embolism of vascular prosthetic devices, implants and grafts, initial encounter: Secondary | ICD-10-CM | POA: Diagnosis not present

## 2019-10-18 DIAGNOSIS — D631 Anemia in chronic kidney disease: Secondary | ICD-10-CM | POA: Diagnosis not present

## 2019-10-21 DIAGNOSIS — T82818A Embolism of vascular prosthetic devices, implants and grafts, initial encounter: Secondary | ICD-10-CM | POA: Diagnosis not present

## 2019-10-21 DIAGNOSIS — N2581 Secondary hyperparathyroidism of renal origin: Secondary | ICD-10-CM | POA: Diagnosis not present

## 2019-10-21 DIAGNOSIS — D689 Coagulation defect, unspecified: Secondary | ICD-10-CM | POA: Diagnosis not present

## 2019-10-21 DIAGNOSIS — N186 End stage renal disease: Secondary | ICD-10-CM | POA: Diagnosis not present

## 2019-10-21 DIAGNOSIS — D631 Anemia in chronic kidney disease: Secondary | ICD-10-CM | POA: Diagnosis not present

## 2019-10-21 DIAGNOSIS — E1022 Type 1 diabetes mellitus with diabetic chronic kidney disease: Secondary | ICD-10-CM | POA: Diagnosis not present

## 2019-10-21 DIAGNOSIS — Z992 Dependence on renal dialysis: Secondary | ICD-10-CM | POA: Diagnosis not present

## 2019-10-22 ENCOUNTER — Other Ambulatory Visit: Payer: Self-pay

## 2019-10-22 MED ORDER — PRASUGREL HCL 10 MG PO TABS: 10.0000 mg | ORAL_TABLET | Freq: Every day | ORAL | 0 refills | Status: DC

## 2019-10-23 DIAGNOSIS — Z992 Dependence on renal dialysis: Secondary | ICD-10-CM | POA: Diagnosis not present

## 2019-10-23 DIAGNOSIS — D631 Anemia in chronic kidney disease: Secondary | ICD-10-CM | POA: Diagnosis not present

## 2019-10-23 DIAGNOSIS — T8612 Kidney transplant failure: Secondary | ICD-10-CM | POA: Diagnosis not present

## 2019-10-23 DIAGNOSIS — N186 End stage renal disease: Secondary | ICD-10-CM | POA: Diagnosis not present

## 2019-10-23 DIAGNOSIS — E1022 Type 1 diabetes mellitus with diabetic chronic kidney disease: Secondary | ICD-10-CM | POA: Diagnosis not present

## 2019-10-23 DIAGNOSIS — N2581 Secondary hyperparathyroidism of renal origin: Secondary | ICD-10-CM | POA: Diagnosis not present

## 2019-10-23 DIAGNOSIS — T82818A Embolism of vascular prosthetic devices, implants and grafts, initial encounter: Secondary | ICD-10-CM | POA: Diagnosis not present

## 2019-10-23 DIAGNOSIS — D689 Coagulation defect, unspecified: Secondary | ICD-10-CM | POA: Diagnosis not present

## 2019-10-25 DIAGNOSIS — R197 Diarrhea, unspecified: Secondary | ICD-10-CM | POA: Diagnosis not present

## 2019-10-25 DIAGNOSIS — T82818A Embolism of vascular prosthetic devices, implants and grafts, initial encounter: Secondary | ICD-10-CM | POA: Diagnosis not present

## 2019-10-25 DIAGNOSIS — D631 Anemia in chronic kidney disease: Secondary | ICD-10-CM | POA: Diagnosis not present

## 2019-10-25 DIAGNOSIS — E1022 Type 1 diabetes mellitus with diabetic chronic kidney disease: Secondary | ICD-10-CM | POA: Diagnosis not present

## 2019-10-25 DIAGNOSIS — D689 Coagulation defect, unspecified: Secondary | ICD-10-CM | POA: Diagnosis not present

## 2019-10-25 DIAGNOSIS — N186 End stage renal disease: Secondary | ICD-10-CM | POA: Diagnosis not present

## 2019-10-25 DIAGNOSIS — Z992 Dependence on renal dialysis: Secondary | ICD-10-CM | POA: Diagnosis not present

## 2019-10-25 DIAGNOSIS — N2581 Secondary hyperparathyroidism of renal origin: Secondary | ICD-10-CM | POA: Diagnosis not present

## 2019-10-28 DIAGNOSIS — N186 End stage renal disease: Secondary | ICD-10-CM | POA: Diagnosis not present

## 2019-10-28 DIAGNOSIS — R197 Diarrhea, unspecified: Secondary | ICD-10-CM | POA: Diagnosis not present

## 2019-10-28 DIAGNOSIS — E1022 Type 1 diabetes mellitus with diabetic chronic kidney disease: Secondary | ICD-10-CM | POA: Diagnosis not present

## 2019-10-28 DIAGNOSIS — N2581 Secondary hyperparathyroidism of renal origin: Secondary | ICD-10-CM | POA: Diagnosis not present

## 2019-10-28 DIAGNOSIS — D631 Anemia in chronic kidney disease: Secondary | ICD-10-CM | POA: Diagnosis not present

## 2019-10-28 DIAGNOSIS — D689 Coagulation defect, unspecified: Secondary | ICD-10-CM | POA: Diagnosis not present

## 2019-10-28 DIAGNOSIS — Z992 Dependence on renal dialysis: Secondary | ICD-10-CM | POA: Diagnosis not present

## 2019-10-28 DIAGNOSIS — T82818A Embolism of vascular prosthetic devices, implants and grafts, initial encounter: Secondary | ICD-10-CM | POA: Diagnosis not present

## 2019-10-30 DIAGNOSIS — E1022 Type 1 diabetes mellitus with diabetic chronic kidney disease: Secondary | ICD-10-CM | POA: Diagnosis not present

## 2019-10-30 DIAGNOSIS — N2581 Secondary hyperparathyroidism of renal origin: Secondary | ICD-10-CM | POA: Diagnosis not present

## 2019-10-30 DIAGNOSIS — D689 Coagulation defect, unspecified: Secondary | ICD-10-CM | POA: Diagnosis not present

## 2019-10-30 DIAGNOSIS — N186 End stage renal disease: Secondary | ICD-10-CM | POA: Diagnosis not present

## 2019-10-30 DIAGNOSIS — Z992 Dependence on renal dialysis: Secondary | ICD-10-CM | POA: Diagnosis not present

## 2019-10-30 DIAGNOSIS — T82818A Embolism of vascular prosthetic devices, implants and grafts, initial encounter: Secondary | ICD-10-CM | POA: Diagnosis not present

## 2019-10-30 DIAGNOSIS — D631 Anemia in chronic kidney disease: Secondary | ICD-10-CM | POA: Diagnosis not present

## 2019-10-30 DIAGNOSIS — R197 Diarrhea, unspecified: Secondary | ICD-10-CM | POA: Diagnosis not present

## 2019-11-01 DIAGNOSIS — N2581 Secondary hyperparathyroidism of renal origin: Secondary | ICD-10-CM | POA: Diagnosis not present

## 2019-11-01 DIAGNOSIS — M1711 Unilateral primary osteoarthritis, right knee: Secondary | ICD-10-CM | POA: Diagnosis not present

## 2019-11-01 DIAGNOSIS — Z992 Dependence on renal dialysis: Secondary | ICD-10-CM | POA: Diagnosis not present

## 2019-11-01 DIAGNOSIS — D631 Anemia in chronic kidney disease: Secondary | ICD-10-CM | POA: Diagnosis not present

## 2019-11-01 DIAGNOSIS — E1022 Type 1 diabetes mellitus with diabetic chronic kidney disease: Secondary | ICD-10-CM | POA: Diagnosis not present

## 2019-11-01 DIAGNOSIS — D689 Coagulation defect, unspecified: Secondary | ICD-10-CM | POA: Diagnosis not present

## 2019-11-01 DIAGNOSIS — N186 End stage renal disease: Secondary | ICD-10-CM | POA: Diagnosis not present

## 2019-11-01 DIAGNOSIS — T82818A Embolism of vascular prosthetic devices, implants and grafts, initial encounter: Secondary | ICD-10-CM | POA: Diagnosis not present

## 2019-11-01 DIAGNOSIS — R197 Diarrhea, unspecified: Secondary | ICD-10-CM | POA: Diagnosis not present

## 2019-11-04 DIAGNOSIS — E1022 Type 1 diabetes mellitus with diabetic chronic kidney disease: Secondary | ICD-10-CM | POA: Diagnosis not present

## 2019-11-04 DIAGNOSIS — T82818A Embolism of vascular prosthetic devices, implants and grafts, initial encounter: Secondary | ICD-10-CM | POA: Diagnosis not present

## 2019-11-04 DIAGNOSIS — D689 Coagulation defect, unspecified: Secondary | ICD-10-CM | POA: Diagnosis not present

## 2019-11-04 DIAGNOSIS — Z992 Dependence on renal dialysis: Secondary | ICD-10-CM | POA: Diagnosis not present

## 2019-11-04 DIAGNOSIS — D631 Anemia in chronic kidney disease: Secondary | ICD-10-CM | POA: Diagnosis not present

## 2019-11-04 DIAGNOSIS — R197 Diarrhea, unspecified: Secondary | ICD-10-CM | POA: Diagnosis not present

## 2019-11-04 DIAGNOSIS — N2581 Secondary hyperparathyroidism of renal origin: Secondary | ICD-10-CM | POA: Diagnosis not present

## 2019-11-04 DIAGNOSIS — N186 End stage renal disease: Secondary | ICD-10-CM | POA: Diagnosis not present

## 2019-11-06 DIAGNOSIS — Z992 Dependence on renal dialysis: Secondary | ICD-10-CM | POA: Diagnosis not present

## 2019-11-06 DIAGNOSIS — D631 Anemia in chronic kidney disease: Secondary | ICD-10-CM | POA: Diagnosis not present

## 2019-11-06 DIAGNOSIS — D689 Coagulation defect, unspecified: Secondary | ICD-10-CM | POA: Diagnosis not present

## 2019-11-06 DIAGNOSIS — R197 Diarrhea, unspecified: Secondary | ICD-10-CM | POA: Diagnosis not present

## 2019-11-06 DIAGNOSIS — N2581 Secondary hyperparathyroidism of renal origin: Secondary | ICD-10-CM | POA: Diagnosis not present

## 2019-11-06 DIAGNOSIS — E1022 Type 1 diabetes mellitus with diabetic chronic kidney disease: Secondary | ICD-10-CM | POA: Diagnosis not present

## 2019-11-06 DIAGNOSIS — N186 End stage renal disease: Secondary | ICD-10-CM | POA: Diagnosis not present

## 2019-11-06 DIAGNOSIS — T82818A Embolism of vascular prosthetic devices, implants and grafts, initial encounter: Secondary | ICD-10-CM | POA: Diagnosis not present

## 2019-11-08 DIAGNOSIS — N2581 Secondary hyperparathyroidism of renal origin: Secondary | ICD-10-CM | POA: Diagnosis not present

## 2019-11-08 DIAGNOSIS — D631 Anemia in chronic kidney disease: Secondary | ICD-10-CM | POA: Diagnosis not present

## 2019-11-08 DIAGNOSIS — T82818A Embolism of vascular prosthetic devices, implants and grafts, initial encounter: Secondary | ICD-10-CM | POA: Diagnosis not present

## 2019-11-08 DIAGNOSIS — D689 Coagulation defect, unspecified: Secondary | ICD-10-CM | POA: Diagnosis not present

## 2019-11-08 DIAGNOSIS — E1022 Type 1 diabetes mellitus with diabetic chronic kidney disease: Secondary | ICD-10-CM | POA: Diagnosis not present

## 2019-11-08 DIAGNOSIS — N186 End stage renal disease: Secondary | ICD-10-CM | POA: Diagnosis not present

## 2019-11-08 DIAGNOSIS — R197 Diarrhea, unspecified: Secondary | ICD-10-CM | POA: Diagnosis not present

## 2019-11-08 DIAGNOSIS — Z992 Dependence on renal dialysis: Secondary | ICD-10-CM | POA: Diagnosis not present

## 2019-11-11 DIAGNOSIS — R197 Diarrhea, unspecified: Secondary | ICD-10-CM | POA: Diagnosis not present

## 2019-11-11 DIAGNOSIS — D689 Coagulation defect, unspecified: Secondary | ICD-10-CM | POA: Diagnosis not present

## 2019-11-11 DIAGNOSIS — N186 End stage renal disease: Secondary | ICD-10-CM | POA: Diagnosis not present

## 2019-11-11 DIAGNOSIS — E1022 Type 1 diabetes mellitus with diabetic chronic kidney disease: Secondary | ICD-10-CM | POA: Diagnosis not present

## 2019-11-11 DIAGNOSIS — T82818A Embolism of vascular prosthetic devices, implants and grafts, initial encounter: Secondary | ICD-10-CM | POA: Diagnosis not present

## 2019-11-11 DIAGNOSIS — N2581 Secondary hyperparathyroidism of renal origin: Secondary | ICD-10-CM | POA: Diagnosis not present

## 2019-11-11 DIAGNOSIS — D631 Anemia in chronic kidney disease: Secondary | ICD-10-CM | POA: Diagnosis not present

## 2019-11-11 DIAGNOSIS — Z992 Dependence on renal dialysis: Secondary | ICD-10-CM | POA: Diagnosis not present

## 2019-11-13 DIAGNOSIS — N186 End stage renal disease: Secondary | ICD-10-CM | POA: Diagnosis not present

## 2019-11-13 DIAGNOSIS — T82818A Embolism of vascular prosthetic devices, implants and grafts, initial encounter: Secondary | ICD-10-CM | POA: Diagnosis not present

## 2019-11-13 DIAGNOSIS — R197 Diarrhea, unspecified: Secondary | ICD-10-CM | POA: Diagnosis not present

## 2019-11-13 DIAGNOSIS — D631 Anemia in chronic kidney disease: Secondary | ICD-10-CM | POA: Diagnosis not present

## 2019-11-13 DIAGNOSIS — D689 Coagulation defect, unspecified: Secondary | ICD-10-CM | POA: Diagnosis not present

## 2019-11-13 DIAGNOSIS — N2581 Secondary hyperparathyroidism of renal origin: Secondary | ICD-10-CM | POA: Diagnosis not present

## 2019-11-13 DIAGNOSIS — E1022 Type 1 diabetes mellitus with diabetic chronic kidney disease: Secondary | ICD-10-CM | POA: Diagnosis not present

## 2019-11-13 DIAGNOSIS — Z992 Dependence on renal dialysis: Secondary | ICD-10-CM | POA: Diagnosis not present

## 2019-11-15 DIAGNOSIS — N2581 Secondary hyperparathyroidism of renal origin: Secondary | ICD-10-CM | POA: Diagnosis not present

## 2019-11-15 DIAGNOSIS — D631 Anemia in chronic kidney disease: Secondary | ICD-10-CM | POA: Diagnosis not present

## 2019-11-15 DIAGNOSIS — E1022 Type 1 diabetes mellitus with diabetic chronic kidney disease: Secondary | ICD-10-CM | POA: Diagnosis not present

## 2019-11-15 DIAGNOSIS — N186 End stage renal disease: Secondary | ICD-10-CM | POA: Diagnosis not present

## 2019-11-15 DIAGNOSIS — R197 Diarrhea, unspecified: Secondary | ICD-10-CM | POA: Diagnosis not present

## 2019-11-15 DIAGNOSIS — D689 Coagulation defect, unspecified: Secondary | ICD-10-CM | POA: Diagnosis not present

## 2019-11-15 DIAGNOSIS — T82818A Embolism of vascular prosthetic devices, implants and grafts, initial encounter: Secondary | ICD-10-CM | POA: Diagnosis not present

## 2019-11-15 DIAGNOSIS — Z992 Dependence on renal dialysis: Secondary | ICD-10-CM | POA: Diagnosis not present

## 2019-11-18 DIAGNOSIS — R197 Diarrhea, unspecified: Secondary | ICD-10-CM | POA: Diagnosis not present

## 2019-11-18 DIAGNOSIS — N2581 Secondary hyperparathyroidism of renal origin: Secondary | ICD-10-CM | POA: Diagnosis not present

## 2019-11-18 DIAGNOSIS — D689 Coagulation defect, unspecified: Secondary | ICD-10-CM | POA: Diagnosis not present

## 2019-11-18 DIAGNOSIS — D631 Anemia in chronic kidney disease: Secondary | ICD-10-CM | POA: Diagnosis not present

## 2019-11-18 DIAGNOSIS — Z992 Dependence on renal dialysis: Secondary | ICD-10-CM | POA: Diagnosis not present

## 2019-11-18 DIAGNOSIS — E1022 Type 1 diabetes mellitus with diabetic chronic kidney disease: Secondary | ICD-10-CM | POA: Diagnosis not present

## 2019-11-18 DIAGNOSIS — N186 End stage renal disease: Secondary | ICD-10-CM | POA: Diagnosis not present

## 2019-11-18 DIAGNOSIS — T82818A Embolism of vascular prosthetic devices, implants and grafts, initial encounter: Secondary | ICD-10-CM | POA: Diagnosis not present

## 2019-11-20 DIAGNOSIS — R197 Diarrhea, unspecified: Secondary | ICD-10-CM | POA: Diagnosis not present

## 2019-11-20 DIAGNOSIS — Z992 Dependence on renal dialysis: Secondary | ICD-10-CM | POA: Diagnosis not present

## 2019-11-20 DIAGNOSIS — T82818A Embolism of vascular prosthetic devices, implants and grafts, initial encounter: Secondary | ICD-10-CM | POA: Diagnosis not present

## 2019-11-20 DIAGNOSIS — E1022 Type 1 diabetes mellitus with diabetic chronic kidney disease: Secondary | ICD-10-CM | POA: Diagnosis not present

## 2019-11-20 DIAGNOSIS — D689 Coagulation defect, unspecified: Secondary | ICD-10-CM | POA: Diagnosis not present

## 2019-11-20 DIAGNOSIS — D631 Anemia in chronic kidney disease: Secondary | ICD-10-CM | POA: Diagnosis not present

## 2019-11-20 DIAGNOSIS — N186 End stage renal disease: Secondary | ICD-10-CM | POA: Diagnosis not present

## 2019-11-20 DIAGNOSIS — N2581 Secondary hyperparathyroidism of renal origin: Secondary | ICD-10-CM | POA: Diagnosis not present

## 2019-11-22 DIAGNOSIS — Z992 Dependence on renal dialysis: Secondary | ICD-10-CM | POA: Diagnosis not present

## 2019-11-22 DIAGNOSIS — N2581 Secondary hyperparathyroidism of renal origin: Secondary | ICD-10-CM | POA: Diagnosis not present

## 2019-11-22 DIAGNOSIS — E1022 Type 1 diabetes mellitus with diabetic chronic kidney disease: Secondary | ICD-10-CM | POA: Diagnosis not present

## 2019-11-22 DIAGNOSIS — D631 Anemia in chronic kidney disease: Secondary | ICD-10-CM | POA: Diagnosis not present

## 2019-11-22 DIAGNOSIS — N186 End stage renal disease: Secondary | ICD-10-CM | POA: Diagnosis not present

## 2019-11-22 DIAGNOSIS — T82818A Embolism of vascular prosthetic devices, implants and grafts, initial encounter: Secondary | ICD-10-CM | POA: Diagnosis not present

## 2019-11-22 DIAGNOSIS — D689 Coagulation defect, unspecified: Secondary | ICD-10-CM | POA: Diagnosis not present

## 2019-11-22 DIAGNOSIS — R197 Diarrhea, unspecified: Secondary | ICD-10-CM | POA: Diagnosis not present

## 2019-11-23 DIAGNOSIS — Z992 Dependence on renal dialysis: Secondary | ICD-10-CM | POA: Diagnosis not present

## 2019-11-23 DIAGNOSIS — N186 End stage renal disease: Secondary | ICD-10-CM | POA: Diagnosis not present

## 2019-11-23 DIAGNOSIS — T8612 Kidney transplant failure: Secondary | ICD-10-CM | POA: Diagnosis not present

## 2019-11-25 DIAGNOSIS — E1022 Type 1 diabetes mellitus with diabetic chronic kidney disease: Secondary | ICD-10-CM | POA: Diagnosis not present

## 2019-11-25 DIAGNOSIS — Z992 Dependence on renal dialysis: Secondary | ICD-10-CM | POA: Diagnosis not present

## 2019-11-25 DIAGNOSIS — T82818A Embolism of vascular prosthetic devices, implants and grafts, initial encounter: Secondary | ICD-10-CM | POA: Diagnosis not present

## 2019-11-25 DIAGNOSIS — N186 End stage renal disease: Secondary | ICD-10-CM | POA: Diagnosis not present

## 2019-11-25 DIAGNOSIS — N2581 Secondary hyperparathyroidism of renal origin: Secondary | ICD-10-CM | POA: Diagnosis not present

## 2019-11-25 DIAGNOSIS — D689 Coagulation defect, unspecified: Secondary | ICD-10-CM | POA: Diagnosis not present

## 2019-11-25 DIAGNOSIS — D631 Anemia in chronic kidney disease: Secondary | ICD-10-CM | POA: Diagnosis not present

## 2019-11-27 DIAGNOSIS — T82818A Embolism of vascular prosthetic devices, implants and grafts, initial encounter: Secondary | ICD-10-CM | POA: Diagnosis not present

## 2019-11-27 DIAGNOSIS — Z992 Dependence on renal dialysis: Secondary | ICD-10-CM | POA: Diagnosis not present

## 2019-11-27 DIAGNOSIS — N186 End stage renal disease: Secondary | ICD-10-CM | POA: Diagnosis not present

## 2019-11-27 DIAGNOSIS — N2581 Secondary hyperparathyroidism of renal origin: Secondary | ICD-10-CM | POA: Diagnosis not present

## 2019-11-27 DIAGNOSIS — D631 Anemia in chronic kidney disease: Secondary | ICD-10-CM | POA: Diagnosis not present

## 2019-11-27 DIAGNOSIS — E1022 Type 1 diabetes mellitus with diabetic chronic kidney disease: Secondary | ICD-10-CM | POA: Diagnosis not present

## 2019-11-27 DIAGNOSIS — D689 Coagulation defect, unspecified: Secondary | ICD-10-CM | POA: Diagnosis not present

## 2019-11-29 DIAGNOSIS — T82818A Embolism of vascular prosthetic devices, implants and grafts, initial encounter: Secondary | ICD-10-CM | POA: Diagnosis not present

## 2019-11-29 DIAGNOSIS — N186 End stage renal disease: Secondary | ICD-10-CM | POA: Diagnosis not present

## 2019-11-29 DIAGNOSIS — E1022 Type 1 diabetes mellitus with diabetic chronic kidney disease: Secondary | ICD-10-CM | POA: Diagnosis not present

## 2019-11-29 DIAGNOSIS — N2581 Secondary hyperparathyroidism of renal origin: Secondary | ICD-10-CM | POA: Diagnosis not present

## 2019-11-29 DIAGNOSIS — D631 Anemia in chronic kidney disease: Secondary | ICD-10-CM | POA: Diagnosis not present

## 2019-11-29 DIAGNOSIS — D689 Coagulation defect, unspecified: Secondary | ICD-10-CM | POA: Diagnosis not present

## 2019-11-29 DIAGNOSIS — Z992 Dependence on renal dialysis: Secondary | ICD-10-CM | POA: Diagnosis not present

## 2019-12-02 DIAGNOSIS — E1022 Type 1 diabetes mellitus with diabetic chronic kidney disease: Secondary | ICD-10-CM | POA: Diagnosis not present

## 2019-12-02 DIAGNOSIS — N2581 Secondary hyperparathyroidism of renal origin: Secondary | ICD-10-CM | POA: Diagnosis not present

## 2019-12-02 DIAGNOSIS — T82818A Embolism of vascular prosthetic devices, implants and grafts, initial encounter: Secondary | ICD-10-CM | POA: Diagnosis not present

## 2019-12-02 DIAGNOSIS — D631 Anemia in chronic kidney disease: Secondary | ICD-10-CM | POA: Diagnosis not present

## 2019-12-02 DIAGNOSIS — D689 Coagulation defect, unspecified: Secondary | ICD-10-CM | POA: Diagnosis not present

## 2019-12-02 DIAGNOSIS — N186 End stage renal disease: Secondary | ICD-10-CM | POA: Diagnosis not present

## 2019-12-02 DIAGNOSIS — Z992 Dependence on renal dialysis: Secondary | ICD-10-CM | POA: Diagnosis not present

## 2019-12-03 DIAGNOSIS — E1051 Type 1 diabetes mellitus with diabetic peripheral angiopathy without gangrene: Secondary | ICD-10-CM | POA: Diagnosis not present

## 2019-12-03 DIAGNOSIS — E10649 Type 1 diabetes mellitus with hypoglycemia without coma: Secondary | ICD-10-CM | POA: Diagnosis not present

## 2019-12-03 DIAGNOSIS — S98131A Complete traumatic amputation of one right lesser toe, initial encounter: Secondary | ICD-10-CM | POA: Diagnosis not present

## 2019-12-03 DIAGNOSIS — E1040 Type 1 diabetes mellitus with diabetic neuropathy, unspecified: Secondary | ICD-10-CM | POA: Diagnosis not present

## 2019-12-03 DIAGNOSIS — E1069 Type 1 diabetes mellitus with other specified complication: Secondary | ICD-10-CM | POA: Diagnosis not present

## 2019-12-03 DIAGNOSIS — E1059 Type 1 diabetes mellitus with other circulatory complications: Secondary | ICD-10-CM | POA: Diagnosis not present

## 2019-12-03 DIAGNOSIS — Z89612 Acquired absence of left leg above knee: Secondary | ICD-10-CM | POA: Diagnosis not present

## 2019-12-03 DIAGNOSIS — E1022 Type 1 diabetes mellitus with diabetic chronic kidney disease: Secondary | ICD-10-CM | POA: Diagnosis not present

## 2019-12-03 DIAGNOSIS — E103593 Type 1 diabetes mellitus with proliferative diabetic retinopathy without macular edema, bilateral: Secondary | ICD-10-CM | POA: Diagnosis not present

## 2019-12-03 DIAGNOSIS — E785 Hyperlipidemia, unspecified: Secondary | ICD-10-CM | POA: Diagnosis not present

## 2019-12-03 DIAGNOSIS — I152 Hypertension secondary to endocrine disorders: Secondary | ICD-10-CM | POA: Diagnosis not present

## 2019-12-03 DIAGNOSIS — S98111A Complete traumatic amputation of right great toe, initial encounter: Secondary | ICD-10-CM | POA: Diagnosis not present

## 2019-12-04 DIAGNOSIS — N2581 Secondary hyperparathyroidism of renal origin: Secondary | ICD-10-CM | POA: Diagnosis not present

## 2019-12-04 DIAGNOSIS — D689 Coagulation defect, unspecified: Secondary | ICD-10-CM | POA: Diagnosis not present

## 2019-12-04 DIAGNOSIS — N186 End stage renal disease: Secondary | ICD-10-CM | POA: Diagnosis not present

## 2019-12-04 DIAGNOSIS — Z992 Dependence on renal dialysis: Secondary | ICD-10-CM | POA: Diagnosis not present

## 2019-12-04 DIAGNOSIS — E108 Type 1 diabetes mellitus with unspecified complications: Secondary | ICD-10-CM | POA: Diagnosis not present

## 2019-12-04 DIAGNOSIS — E1022 Type 1 diabetes mellitus with diabetic chronic kidney disease: Secondary | ICD-10-CM | POA: Diagnosis not present

## 2019-12-04 DIAGNOSIS — D631 Anemia in chronic kidney disease: Secondary | ICD-10-CM | POA: Diagnosis not present

## 2019-12-04 DIAGNOSIS — Z4681 Encounter for fitting and adjustment of insulin pump: Secondary | ICD-10-CM | POA: Diagnosis not present

## 2019-12-04 DIAGNOSIS — T82818A Embolism of vascular prosthetic devices, implants and grafts, initial encounter: Secondary | ICD-10-CM | POA: Diagnosis not present

## 2019-12-06 DIAGNOSIS — T82818A Embolism of vascular prosthetic devices, implants and grafts, initial encounter: Secondary | ICD-10-CM | POA: Diagnosis not present

## 2019-12-06 DIAGNOSIS — D631 Anemia in chronic kidney disease: Secondary | ICD-10-CM | POA: Diagnosis not present

## 2019-12-06 DIAGNOSIS — N2581 Secondary hyperparathyroidism of renal origin: Secondary | ICD-10-CM | POA: Diagnosis not present

## 2019-12-06 DIAGNOSIS — D689 Coagulation defect, unspecified: Secondary | ICD-10-CM | POA: Diagnosis not present

## 2019-12-06 DIAGNOSIS — E1022 Type 1 diabetes mellitus with diabetic chronic kidney disease: Secondary | ICD-10-CM | POA: Diagnosis not present

## 2019-12-06 DIAGNOSIS — Z992 Dependence on renal dialysis: Secondary | ICD-10-CM | POA: Diagnosis not present

## 2019-12-06 DIAGNOSIS — N186 End stage renal disease: Secondary | ICD-10-CM | POA: Diagnosis not present

## 2019-12-09 DIAGNOSIS — T82818A Embolism of vascular prosthetic devices, implants and grafts, initial encounter: Secondary | ICD-10-CM | POA: Diagnosis not present

## 2019-12-09 DIAGNOSIS — D631 Anemia in chronic kidney disease: Secondary | ICD-10-CM | POA: Diagnosis not present

## 2019-12-09 DIAGNOSIS — Z992 Dependence on renal dialysis: Secondary | ICD-10-CM | POA: Diagnosis not present

## 2019-12-09 DIAGNOSIS — N2581 Secondary hyperparathyroidism of renal origin: Secondary | ICD-10-CM | POA: Diagnosis not present

## 2019-12-09 DIAGNOSIS — E1022 Type 1 diabetes mellitus with diabetic chronic kidney disease: Secondary | ICD-10-CM | POA: Diagnosis not present

## 2019-12-09 DIAGNOSIS — D689 Coagulation defect, unspecified: Secondary | ICD-10-CM | POA: Diagnosis not present

## 2019-12-09 DIAGNOSIS — N186 End stage renal disease: Secondary | ICD-10-CM | POA: Diagnosis not present

## 2019-12-11 DIAGNOSIS — E1022 Type 1 diabetes mellitus with diabetic chronic kidney disease: Secondary | ICD-10-CM | POA: Diagnosis not present

## 2019-12-11 DIAGNOSIS — N186 End stage renal disease: Secondary | ICD-10-CM | POA: Diagnosis not present

## 2019-12-11 DIAGNOSIS — Z992 Dependence on renal dialysis: Secondary | ICD-10-CM | POA: Diagnosis not present

## 2019-12-11 DIAGNOSIS — N2581 Secondary hyperparathyroidism of renal origin: Secondary | ICD-10-CM | POA: Diagnosis not present

## 2019-12-11 DIAGNOSIS — D631 Anemia in chronic kidney disease: Secondary | ICD-10-CM | POA: Diagnosis not present

## 2019-12-11 DIAGNOSIS — D689 Coagulation defect, unspecified: Secondary | ICD-10-CM | POA: Diagnosis not present

## 2019-12-11 DIAGNOSIS — T82818A Embolism of vascular prosthetic devices, implants and grafts, initial encounter: Secondary | ICD-10-CM | POA: Diagnosis not present

## 2019-12-12 DIAGNOSIS — E108 Type 1 diabetes mellitus with unspecified complications: Secondary | ICD-10-CM | POA: Diagnosis not present

## 2019-12-12 DIAGNOSIS — E1040 Type 1 diabetes mellitus with diabetic neuropathy, unspecified: Secondary | ICD-10-CM | POA: Diagnosis not present

## 2019-12-12 DIAGNOSIS — E1059 Type 1 diabetes mellitus with other circulatory complications: Secondary | ICD-10-CM | POA: Diagnosis not present

## 2019-12-12 DIAGNOSIS — Z794 Long term (current) use of insulin: Secondary | ICD-10-CM | POA: Diagnosis not present

## 2019-12-13 DIAGNOSIS — N2581 Secondary hyperparathyroidism of renal origin: Secondary | ICD-10-CM | POA: Diagnosis not present

## 2019-12-13 DIAGNOSIS — N186 End stage renal disease: Secondary | ICD-10-CM | POA: Diagnosis not present

## 2019-12-13 DIAGNOSIS — Z992 Dependence on renal dialysis: Secondary | ICD-10-CM | POA: Diagnosis not present

## 2019-12-13 DIAGNOSIS — D631 Anemia in chronic kidney disease: Secondary | ICD-10-CM | POA: Diagnosis not present

## 2019-12-13 DIAGNOSIS — E1022 Type 1 diabetes mellitus with diabetic chronic kidney disease: Secondary | ICD-10-CM | POA: Diagnosis not present

## 2019-12-13 DIAGNOSIS — T82818A Embolism of vascular prosthetic devices, implants and grafts, initial encounter: Secondary | ICD-10-CM | POA: Diagnosis not present

## 2019-12-13 DIAGNOSIS — D689 Coagulation defect, unspecified: Secondary | ICD-10-CM | POA: Diagnosis not present

## 2019-12-16 DIAGNOSIS — D631 Anemia in chronic kidney disease: Secondary | ICD-10-CM | POA: Diagnosis not present

## 2019-12-16 DIAGNOSIS — Z992 Dependence on renal dialysis: Secondary | ICD-10-CM | POA: Diagnosis not present

## 2019-12-16 DIAGNOSIS — D689 Coagulation defect, unspecified: Secondary | ICD-10-CM | POA: Diagnosis not present

## 2019-12-16 DIAGNOSIS — N2581 Secondary hyperparathyroidism of renal origin: Secondary | ICD-10-CM | POA: Diagnosis not present

## 2019-12-16 DIAGNOSIS — T82818A Embolism of vascular prosthetic devices, implants and grafts, initial encounter: Secondary | ICD-10-CM | POA: Diagnosis not present

## 2019-12-16 DIAGNOSIS — E1022 Type 1 diabetes mellitus with diabetic chronic kidney disease: Secondary | ICD-10-CM | POA: Diagnosis not present

## 2019-12-16 DIAGNOSIS — N186 End stage renal disease: Secondary | ICD-10-CM | POA: Diagnosis not present

## 2019-12-18 DIAGNOSIS — D689 Coagulation defect, unspecified: Secondary | ICD-10-CM | POA: Diagnosis not present

## 2019-12-18 DIAGNOSIS — Z992 Dependence on renal dialysis: Secondary | ICD-10-CM | POA: Diagnosis not present

## 2019-12-18 DIAGNOSIS — N186 End stage renal disease: Secondary | ICD-10-CM | POA: Diagnosis not present

## 2019-12-18 DIAGNOSIS — T82818A Embolism of vascular prosthetic devices, implants and grafts, initial encounter: Secondary | ICD-10-CM | POA: Diagnosis not present

## 2019-12-18 DIAGNOSIS — D631 Anemia in chronic kidney disease: Secondary | ICD-10-CM | POA: Diagnosis not present

## 2019-12-18 DIAGNOSIS — E1022 Type 1 diabetes mellitus with diabetic chronic kidney disease: Secondary | ICD-10-CM | POA: Diagnosis not present

## 2019-12-18 DIAGNOSIS — N2581 Secondary hyperparathyroidism of renal origin: Secondary | ICD-10-CM | POA: Diagnosis not present

## 2019-12-20 DIAGNOSIS — N186 End stage renal disease: Secondary | ICD-10-CM | POA: Diagnosis not present

## 2019-12-20 DIAGNOSIS — Z992 Dependence on renal dialysis: Secondary | ICD-10-CM | POA: Diagnosis not present

## 2019-12-20 DIAGNOSIS — D631 Anemia in chronic kidney disease: Secondary | ICD-10-CM | POA: Diagnosis not present

## 2019-12-20 DIAGNOSIS — E1022 Type 1 diabetes mellitus with diabetic chronic kidney disease: Secondary | ICD-10-CM | POA: Diagnosis not present

## 2019-12-20 DIAGNOSIS — D689 Coagulation defect, unspecified: Secondary | ICD-10-CM | POA: Diagnosis not present

## 2019-12-20 DIAGNOSIS — T82818A Embolism of vascular prosthetic devices, implants and grafts, initial encounter: Secondary | ICD-10-CM | POA: Diagnosis not present

## 2019-12-20 DIAGNOSIS — N2581 Secondary hyperparathyroidism of renal origin: Secondary | ICD-10-CM | POA: Diagnosis not present

## 2019-12-23 DIAGNOSIS — N186 End stage renal disease: Secondary | ICD-10-CM | POA: Diagnosis not present

## 2019-12-23 DIAGNOSIS — E1022 Type 1 diabetes mellitus with diabetic chronic kidney disease: Secondary | ICD-10-CM | POA: Diagnosis not present

## 2019-12-23 DIAGNOSIS — T82818A Embolism of vascular prosthetic devices, implants and grafts, initial encounter: Secondary | ICD-10-CM | POA: Diagnosis not present

## 2019-12-23 DIAGNOSIS — D631 Anemia in chronic kidney disease: Secondary | ICD-10-CM | POA: Diagnosis not present

## 2019-12-23 DIAGNOSIS — N2581 Secondary hyperparathyroidism of renal origin: Secondary | ICD-10-CM | POA: Diagnosis not present

## 2019-12-23 DIAGNOSIS — Z992 Dependence on renal dialysis: Secondary | ICD-10-CM | POA: Diagnosis not present

## 2019-12-23 DIAGNOSIS — D689 Coagulation defect, unspecified: Secondary | ICD-10-CM | POA: Diagnosis not present

## 2019-12-24 DIAGNOSIS — N186 End stage renal disease: Secondary | ICD-10-CM | POA: Diagnosis not present

## 2019-12-24 DIAGNOSIS — Z992 Dependence on renal dialysis: Secondary | ICD-10-CM | POA: Diagnosis not present

## 2019-12-24 DIAGNOSIS — T8612 Kidney transplant failure: Secondary | ICD-10-CM | POA: Diagnosis not present

## 2019-12-25 DIAGNOSIS — D631 Anemia in chronic kidney disease: Secondary | ICD-10-CM | POA: Diagnosis not present

## 2019-12-25 DIAGNOSIS — N2581 Secondary hyperparathyroidism of renal origin: Secondary | ICD-10-CM | POA: Diagnosis not present

## 2019-12-25 DIAGNOSIS — Z23 Encounter for immunization: Secondary | ICD-10-CM | POA: Diagnosis not present

## 2019-12-25 DIAGNOSIS — N186 End stage renal disease: Secondary | ICD-10-CM | POA: Diagnosis not present

## 2019-12-25 DIAGNOSIS — Z992 Dependence on renal dialysis: Secondary | ICD-10-CM | POA: Diagnosis not present

## 2019-12-25 DIAGNOSIS — E1022 Type 1 diabetes mellitus with diabetic chronic kidney disease: Secondary | ICD-10-CM | POA: Diagnosis not present

## 2019-12-25 DIAGNOSIS — T82818A Embolism of vascular prosthetic devices, implants and grafts, initial encounter: Secondary | ICD-10-CM | POA: Diagnosis not present

## 2019-12-25 DIAGNOSIS — D689 Coagulation defect, unspecified: Secondary | ICD-10-CM | POA: Diagnosis not present

## 2019-12-27 DIAGNOSIS — T82818A Embolism of vascular prosthetic devices, implants and grafts, initial encounter: Secondary | ICD-10-CM | POA: Diagnosis not present

## 2019-12-27 DIAGNOSIS — D689 Coagulation defect, unspecified: Secondary | ICD-10-CM | POA: Diagnosis not present

## 2019-12-27 DIAGNOSIS — Z23 Encounter for immunization: Secondary | ICD-10-CM | POA: Diagnosis not present

## 2019-12-27 DIAGNOSIS — Z992 Dependence on renal dialysis: Secondary | ICD-10-CM | POA: Diagnosis not present

## 2019-12-27 DIAGNOSIS — D631 Anemia in chronic kidney disease: Secondary | ICD-10-CM | POA: Diagnosis not present

## 2019-12-27 DIAGNOSIS — N186 End stage renal disease: Secondary | ICD-10-CM | POA: Diagnosis not present

## 2019-12-27 DIAGNOSIS — N2581 Secondary hyperparathyroidism of renal origin: Secondary | ICD-10-CM | POA: Diagnosis not present

## 2019-12-27 DIAGNOSIS — E1022 Type 1 diabetes mellitus with diabetic chronic kidney disease: Secondary | ICD-10-CM | POA: Diagnosis not present

## 2019-12-30 DIAGNOSIS — N186 End stage renal disease: Secondary | ICD-10-CM | POA: Diagnosis not present

## 2019-12-30 DIAGNOSIS — E1022 Type 1 diabetes mellitus with diabetic chronic kidney disease: Secondary | ICD-10-CM | POA: Diagnosis not present

## 2019-12-30 DIAGNOSIS — D631 Anemia in chronic kidney disease: Secondary | ICD-10-CM | POA: Diagnosis not present

## 2019-12-30 DIAGNOSIS — Z992 Dependence on renal dialysis: Secondary | ICD-10-CM | POA: Diagnosis not present

## 2019-12-30 DIAGNOSIS — T82818A Embolism of vascular prosthetic devices, implants and grafts, initial encounter: Secondary | ICD-10-CM | POA: Diagnosis not present

## 2019-12-30 DIAGNOSIS — D689 Coagulation defect, unspecified: Secondary | ICD-10-CM | POA: Diagnosis not present

## 2019-12-30 DIAGNOSIS — N2581 Secondary hyperparathyroidism of renal origin: Secondary | ICD-10-CM | POA: Diagnosis not present

## 2019-12-30 DIAGNOSIS — Z23 Encounter for immunization: Secondary | ICD-10-CM | POA: Diagnosis not present

## 2020-01-01 DIAGNOSIS — D689 Coagulation defect, unspecified: Secondary | ICD-10-CM | POA: Diagnosis not present

## 2020-01-01 DIAGNOSIS — N2581 Secondary hyperparathyroidism of renal origin: Secondary | ICD-10-CM | POA: Diagnosis not present

## 2020-01-01 DIAGNOSIS — Z23 Encounter for immunization: Secondary | ICD-10-CM | POA: Diagnosis not present

## 2020-01-01 DIAGNOSIS — E1022 Type 1 diabetes mellitus with diabetic chronic kidney disease: Secondary | ICD-10-CM | POA: Diagnosis not present

## 2020-01-01 DIAGNOSIS — T82818A Embolism of vascular prosthetic devices, implants and grafts, initial encounter: Secondary | ICD-10-CM | POA: Diagnosis not present

## 2020-01-01 DIAGNOSIS — N186 End stage renal disease: Secondary | ICD-10-CM | POA: Diagnosis not present

## 2020-01-01 DIAGNOSIS — Z992 Dependence on renal dialysis: Secondary | ICD-10-CM | POA: Diagnosis not present

## 2020-01-01 DIAGNOSIS — D631 Anemia in chronic kidney disease: Secondary | ICD-10-CM | POA: Diagnosis not present

## 2020-01-03 DIAGNOSIS — Z992 Dependence on renal dialysis: Secondary | ICD-10-CM | POA: Diagnosis not present

## 2020-01-03 DIAGNOSIS — Z23 Encounter for immunization: Secondary | ICD-10-CM | POA: Diagnosis not present

## 2020-01-03 DIAGNOSIS — D631 Anemia in chronic kidney disease: Secondary | ICD-10-CM | POA: Diagnosis not present

## 2020-01-03 DIAGNOSIS — D689 Coagulation defect, unspecified: Secondary | ICD-10-CM | POA: Diagnosis not present

## 2020-01-03 DIAGNOSIS — N186 End stage renal disease: Secondary | ICD-10-CM | POA: Diagnosis not present

## 2020-01-03 DIAGNOSIS — E1022 Type 1 diabetes mellitus with diabetic chronic kidney disease: Secondary | ICD-10-CM | POA: Diagnosis not present

## 2020-01-03 DIAGNOSIS — T82818A Embolism of vascular prosthetic devices, implants and grafts, initial encounter: Secondary | ICD-10-CM | POA: Diagnosis not present

## 2020-01-03 DIAGNOSIS — N2581 Secondary hyperparathyroidism of renal origin: Secondary | ICD-10-CM | POA: Diagnosis not present

## 2020-01-06 DIAGNOSIS — D689 Coagulation defect, unspecified: Secondary | ICD-10-CM | POA: Diagnosis not present

## 2020-01-06 DIAGNOSIS — D631 Anemia in chronic kidney disease: Secondary | ICD-10-CM | POA: Diagnosis not present

## 2020-01-06 DIAGNOSIS — Z23 Encounter for immunization: Secondary | ICD-10-CM | POA: Diagnosis not present

## 2020-01-06 DIAGNOSIS — E1022 Type 1 diabetes mellitus with diabetic chronic kidney disease: Secondary | ICD-10-CM | POA: Diagnosis not present

## 2020-01-06 DIAGNOSIS — Z992 Dependence on renal dialysis: Secondary | ICD-10-CM | POA: Diagnosis not present

## 2020-01-06 DIAGNOSIS — N2581 Secondary hyperparathyroidism of renal origin: Secondary | ICD-10-CM | POA: Diagnosis not present

## 2020-01-06 DIAGNOSIS — N186 End stage renal disease: Secondary | ICD-10-CM | POA: Diagnosis not present

## 2020-01-06 DIAGNOSIS — T82818A Embolism of vascular prosthetic devices, implants and grafts, initial encounter: Secondary | ICD-10-CM | POA: Diagnosis not present

## 2020-01-08 DIAGNOSIS — E1022 Type 1 diabetes mellitus with diabetic chronic kidney disease: Secondary | ICD-10-CM | POA: Diagnosis not present

## 2020-01-08 DIAGNOSIS — N186 End stage renal disease: Secondary | ICD-10-CM | POA: Diagnosis not present

## 2020-01-08 DIAGNOSIS — D689 Coagulation defect, unspecified: Secondary | ICD-10-CM | POA: Diagnosis not present

## 2020-01-08 DIAGNOSIS — T82818A Embolism of vascular prosthetic devices, implants and grafts, initial encounter: Secondary | ICD-10-CM | POA: Diagnosis not present

## 2020-01-08 DIAGNOSIS — D631 Anemia in chronic kidney disease: Secondary | ICD-10-CM | POA: Diagnosis not present

## 2020-01-08 DIAGNOSIS — Z23 Encounter for immunization: Secondary | ICD-10-CM | POA: Diagnosis not present

## 2020-01-08 DIAGNOSIS — Z992 Dependence on renal dialysis: Secondary | ICD-10-CM | POA: Diagnosis not present

## 2020-01-08 DIAGNOSIS — N2581 Secondary hyperparathyroidism of renal origin: Secondary | ICD-10-CM | POA: Diagnosis not present

## 2020-01-10 DIAGNOSIS — Z23 Encounter for immunization: Secondary | ICD-10-CM | POA: Diagnosis not present

## 2020-01-10 DIAGNOSIS — N186 End stage renal disease: Secondary | ICD-10-CM | POA: Diagnosis not present

## 2020-01-10 DIAGNOSIS — N2581 Secondary hyperparathyroidism of renal origin: Secondary | ICD-10-CM | POA: Diagnosis not present

## 2020-01-10 DIAGNOSIS — Z992 Dependence on renal dialysis: Secondary | ICD-10-CM | POA: Diagnosis not present

## 2020-01-10 DIAGNOSIS — D689 Coagulation defect, unspecified: Secondary | ICD-10-CM | POA: Diagnosis not present

## 2020-01-10 DIAGNOSIS — T82818A Embolism of vascular prosthetic devices, implants and grafts, initial encounter: Secondary | ICD-10-CM | POA: Diagnosis not present

## 2020-01-10 DIAGNOSIS — D631 Anemia in chronic kidney disease: Secondary | ICD-10-CM | POA: Diagnosis not present

## 2020-01-10 DIAGNOSIS — E1022 Type 1 diabetes mellitus with diabetic chronic kidney disease: Secondary | ICD-10-CM | POA: Diagnosis not present

## 2020-01-13 DIAGNOSIS — Z992 Dependence on renal dialysis: Secondary | ICD-10-CM | POA: Diagnosis not present

## 2020-01-13 DIAGNOSIS — N2581 Secondary hyperparathyroidism of renal origin: Secondary | ICD-10-CM | POA: Diagnosis not present

## 2020-01-13 DIAGNOSIS — D631 Anemia in chronic kidney disease: Secondary | ICD-10-CM | POA: Diagnosis not present

## 2020-01-13 DIAGNOSIS — E1022 Type 1 diabetes mellitus with diabetic chronic kidney disease: Secondary | ICD-10-CM | POA: Diagnosis not present

## 2020-01-13 DIAGNOSIS — N186 End stage renal disease: Secondary | ICD-10-CM | POA: Diagnosis not present

## 2020-01-13 DIAGNOSIS — D689 Coagulation defect, unspecified: Secondary | ICD-10-CM | POA: Diagnosis not present

## 2020-01-13 DIAGNOSIS — Z23 Encounter for immunization: Secondary | ICD-10-CM | POA: Diagnosis not present

## 2020-01-13 DIAGNOSIS — T82818A Embolism of vascular prosthetic devices, implants and grafts, initial encounter: Secondary | ICD-10-CM | POA: Diagnosis not present

## 2020-01-15 DIAGNOSIS — N2581 Secondary hyperparathyroidism of renal origin: Secondary | ICD-10-CM | POA: Diagnosis not present

## 2020-01-15 DIAGNOSIS — T82818A Embolism of vascular prosthetic devices, implants and grafts, initial encounter: Secondary | ICD-10-CM | POA: Diagnosis not present

## 2020-01-15 DIAGNOSIS — E1022 Type 1 diabetes mellitus with diabetic chronic kidney disease: Secondary | ICD-10-CM | POA: Diagnosis not present

## 2020-01-15 DIAGNOSIS — N186 End stage renal disease: Secondary | ICD-10-CM | POA: Diagnosis not present

## 2020-01-15 DIAGNOSIS — Z992 Dependence on renal dialysis: Secondary | ICD-10-CM | POA: Diagnosis not present

## 2020-01-15 DIAGNOSIS — Z23 Encounter for immunization: Secondary | ICD-10-CM | POA: Diagnosis not present

## 2020-01-15 DIAGNOSIS — D689 Coagulation defect, unspecified: Secondary | ICD-10-CM | POA: Diagnosis not present

## 2020-01-15 DIAGNOSIS — D631 Anemia in chronic kidney disease: Secondary | ICD-10-CM | POA: Diagnosis not present

## 2020-01-17 DIAGNOSIS — D631 Anemia in chronic kidney disease: Secondary | ICD-10-CM | POA: Diagnosis not present

## 2020-01-17 DIAGNOSIS — E1022 Type 1 diabetes mellitus with diabetic chronic kidney disease: Secondary | ICD-10-CM | POA: Diagnosis not present

## 2020-01-17 DIAGNOSIS — D689 Coagulation defect, unspecified: Secondary | ICD-10-CM | POA: Diagnosis not present

## 2020-01-17 DIAGNOSIS — N2581 Secondary hyperparathyroidism of renal origin: Secondary | ICD-10-CM | POA: Diagnosis not present

## 2020-01-17 DIAGNOSIS — Z23 Encounter for immunization: Secondary | ICD-10-CM | POA: Diagnosis not present

## 2020-01-17 DIAGNOSIS — Z992 Dependence on renal dialysis: Secondary | ICD-10-CM | POA: Diagnosis not present

## 2020-01-17 DIAGNOSIS — T82818A Embolism of vascular prosthetic devices, implants and grafts, initial encounter: Secondary | ICD-10-CM | POA: Diagnosis not present

## 2020-01-17 DIAGNOSIS — N186 End stage renal disease: Secondary | ICD-10-CM | POA: Diagnosis not present

## 2020-01-20 DIAGNOSIS — E1022 Type 1 diabetes mellitus with diabetic chronic kidney disease: Secondary | ICD-10-CM | POA: Diagnosis not present

## 2020-01-20 DIAGNOSIS — T82818A Embolism of vascular prosthetic devices, implants and grafts, initial encounter: Secondary | ICD-10-CM | POA: Diagnosis not present

## 2020-01-20 DIAGNOSIS — Z23 Encounter for immunization: Secondary | ICD-10-CM | POA: Diagnosis not present

## 2020-01-20 DIAGNOSIS — N2581 Secondary hyperparathyroidism of renal origin: Secondary | ICD-10-CM | POA: Diagnosis not present

## 2020-01-20 DIAGNOSIS — Z992 Dependence on renal dialysis: Secondary | ICD-10-CM | POA: Diagnosis not present

## 2020-01-20 DIAGNOSIS — D631 Anemia in chronic kidney disease: Secondary | ICD-10-CM | POA: Diagnosis not present

## 2020-01-20 DIAGNOSIS — D689 Coagulation defect, unspecified: Secondary | ICD-10-CM | POA: Diagnosis not present

## 2020-01-20 DIAGNOSIS — N186 End stage renal disease: Secondary | ICD-10-CM | POA: Diagnosis not present

## 2020-01-22 DIAGNOSIS — E1022 Type 1 diabetes mellitus with diabetic chronic kidney disease: Secondary | ICD-10-CM | POA: Diagnosis not present

## 2020-01-22 DIAGNOSIS — N2581 Secondary hyperparathyroidism of renal origin: Secondary | ICD-10-CM | POA: Diagnosis not present

## 2020-01-22 DIAGNOSIS — D631 Anemia in chronic kidney disease: Secondary | ICD-10-CM | POA: Diagnosis not present

## 2020-01-22 DIAGNOSIS — Z23 Encounter for immunization: Secondary | ICD-10-CM | POA: Diagnosis not present

## 2020-01-22 DIAGNOSIS — T82818A Embolism of vascular prosthetic devices, implants and grafts, initial encounter: Secondary | ICD-10-CM | POA: Diagnosis not present

## 2020-01-22 DIAGNOSIS — D689 Coagulation defect, unspecified: Secondary | ICD-10-CM | POA: Diagnosis not present

## 2020-01-22 DIAGNOSIS — Z992 Dependence on renal dialysis: Secondary | ICD-10-CM | POA: Diagnosis not present

## 2020-01-22 DIAGNOSIS — N186 End stage renal disease: Secondary | ICD-10-CM | POA: Diagnosis not present

## 2020-01-23 DIAGNOSIS — M1711 Unilateral primary osteoarthritis, right knee: Secondary | ICD-10-CM | POA: Diagnosis not present

## 2020-01-23 DIAGNOSIS — N186 End stage renal disease: Secondary | ICD-10-CM | POA: Diagnosis not present

## 2020-01-23 DIAGNOSIS — T8612 Kidney transplant failure: Secondary | ICD-10-CM | POA: Diagnosis not present

## 2020-01-23 DIAGNOSIS — Z992 Dependence on renal dialysis: Secondary | ICD-10-CM | POA: Diagnosis not present

## 2020-01-24 DIAGNOSIS — N186 End stage renal disease: Secondary | ICD-10-CM | POA: Diagnosis not present

## 2020-01-24 DIAGNOSIS — T82818A Embolism of vascular prosthetic devices, implants and grafts, initial encounter: Secondary | ICD-10-CM | POA: Diagnosis not present

## 2020-01-24 DIAGNOSIS — Z992 Dependence on renal dialysis: Secondary | ICD-10-CM | POA: Diagnosis not present

## 2020-01-24 DIAGNOSIS — D689 Coagulation defect, unspecified: Secondary | ICD-10-CM | POA: Diagnosis not present

## 2020-01-24 DIAGNOSIS — N2581 Secondary hyperparathyroidism of renal origin: Secondary | ICD-10-CM | POA: Diagnosis not present

## 2020-01-24 DIAGNOSIS — E1022 Type 1 diabetes mellitus with diabetic chronic kidney disease: Secondary | ICD-10-CM | POA: Diagnosis not present

## 2020-01-24 DIAGNOSIS — D631 Anemia in chronic kidney disease: Secondary | ICD-10-CM | POA: Diagnosis not present

## 2020-01-27 DIAGNOSIS — D631 Anemia in chronic kidney disease: Secondary | ICD-10-CM | POA: Diagnosis not present

## 2020-01-27 DIAGNOSIS — N186 End stage renal disease: Secondary | ICD-10-CM | POA: Diagnosis not present

## 2020-01-27 DIAGNOSIS — N2581 Secondary hyperparathyroidism of renal origin: Secondary | ICD-10-CM | POA: Diagnosis not present

## 2020-01-27 DIAGNOSIS — Z992 Dependence on renal dialysis: Secondary | ICD-10-CM | POA: Diagnosis not present

## 2020-01-27 DIAGNOSIS — D689 Coagulation defect, unspecified: Secondary | ICD-10-CM | POA: Diagnosis not present

## 2020-01-27 DIAGNOSIS — Z79899 Other long term (current) drug therapy: Secondary | ICD-10-CM | POA: Diagnosis not present

## 2020-01-27 DIAGNOSIS — T82818A Embolism of vascular prosthetic devices, implants and grafts, initial encounter: Secondary | ICD-10-CM | POA: Diagnosis not present

## 2020-01-27 DIAGNOSIS — M2550 Pain in unspecified joint: Secondary | ICD-10-CM | POA: Diagnosis not present

## 2020-01-27 DIAGNOSIS — E1022 Type 1 diabetes mellitus with diabetic chronic kidney disease: Secondary | ICD-10-CM | POA: Diagnosis not present

## 2020-01-28 ENCOUNTER — Other Ambulatory Visit: Payer: Self-pay | Admitting: Cardiovascular Disease

## 2020-01-29 DIAGNOSIS — N186 End stage renal disease: Secondary | ICD-10-CM | POA: Diagnosis not present

## 2020-01-29 DIAGNOSIS — D631 Anemia in chronic kidney disease: Secondary | ICD-10-CM | POA: Diagnosis not present

## 2020-01-29 DIAGNOSIS — Z992 Dependence on renal dialysis: Secondary | ICD-10-CM | POA: Diagnosis not present

## 2020-01-29 DIAGNOSIS — D689 Coagulation defect, unspecified: Secondary | ICD-10-CM | POA: Diagnosis not present

## 2020-01-29 DIAGNOSIS — T82818A Embolism of vascular prosthetic devices, implants and grafts, initial encounter: Secondary | ICD-10-CM | POA: Diagnosis not present

## 2020-01-29 DIAGNOSIS — N2581 Secondary hyperparathyroidism of renal origin: Secondary | ICD-10-CM | POA: Diagnosis not present

## 2020-01-29 DIAGNOSIS — E1022 Type 1 diabetes mellitus with diabetic chronic kidney disease: Secondary | ICD-10-CM | POA: Diagnosis not present

## 2020-01-31 DIAGNOSIS — T82818A Embolism of vascular prosthetic devices, implants and grafts, initial encounter: Secondary | ICD-10-CM | POA: Diagnosis not present

## 2020-01-31 DIAGNOSIS — E1022 Type 1 diabetes mellitus with diabetic chronic kidney disease: Secondary | ICD-10-CM | POA: Diagnosis not present

## 2020-01-31 DIAGNOSIS — D631 Anemia in chronic kidney disease: Secondary | ICD-10-CM | POA: Diagnosis not present

## 2020-01-31 DIAGNOSIS — Z992 Dependence on renal dialysis: Secondary | ICD-10-CM | POA: Diagnosis not present

## 2020-01-31 DIAGNOSIS — D689 Coagulation defect, unspecified: Secondary | ICD-10-CM | POA: Diagnosis not present

## 2020-01-31 DIAGNOSIS — N2581 Secondary hyperparathyroidism of renal origin: Secondary | ICD-10-CM | POA: Diagnosis not present

## 2020-01-31 DIAGNOSIS — N186 End stage renal disease: Secondary | ICD-10-CM | POA: Diagnosis not present

## 2020-02-03 DIAGNOSIS — E1022 Type 1 diabetes mellitus with diabetic chronic kidney disease: Secondary | ICD-10-CM | POA: Diagnosis not present

## 2020-02-03 DIAGNOSIS — Z992 Dependence on renal dialysis: Secondary | ICD-10-CM | POA: Diagnosis not present

## 2020-02-03 DIAGNOSIS — D689 Coagulation defect, unspecified: Secondary | ICD-10-CM | POA: Diagnosis not present

## 2020-02-03 DIAGNOSIS — D631 Anemia in chronic kidney disease: Secondary | ICD-10-CM | POA: Diagnosis not present

## 2020-02-03 DIAGNOSIS — N186 End stage renal disease: Secondary | ICD-10-CM | POA: Diagnosis not present

## 2020-02-03 DIAGNOSIS — N2581 Secondary hyperparathyroidism of renal origin: Secondary | ICD-10-CM | POA: Diagnosis not present

## 2020-02-03 DIAGNOSIS — T82818A Embolism of vascular prosthetic devices, implants and grafts, initial encounter: Secondary | ICD-10-CM | POA: Diagnosis not present

## 2020-02-05 DIAGNOSIS — D631 Anemia in chronic kidney disease: Secondary | ICD-10-CM | POA: Diagnosis not present

## 2020-02-05 DIAGNOSIS — N186 End stage renal disease: Secondary | ICD-10-CM | POA: Diagnosis not present

## 2020-02-05 DIAGNOSIS — N2581 Secondary hyperparathyroidism of renal origin: Secondary | ICD-10-CM | POA: Diagnosis not present

## 2020-02-05 DIAGNOSIS — T82818A Embolism of vascular prosthetic devices, implants and grafts, initial encounter: Secondary | ICD-10-CM | POA: Diagnosis not present

## 2020-02-05 DIAGNOSIS — D689 Coagulation defect, unspecified: Secondary | ICD-10-CM | POA: Diagnosis not present

## 2020-02-05 DIAGNOSIS — E1022 Type 1 diabetes mellitus with diabetic chronic kidney disease: Secondary | ICD-10-CM | POA: Diagnosis not present

## 2020-02-05 DIAGNOSIS — Z992 Dependence on renal dialysis: Secondary | ICD-10-CM | POA: Diagnosis not present

## 2020-02-07 DIAGNOSIS — Z992 Dependence on renal dialysis: Secondary | ICD-10-CM | POA: Diagnosis not present

## 2020-02-07 DIAGNOSIS — D631 Anemia in chronic kidney disease: Secondary | ICD-10-CM | POA: Diagnosis not present

## 2020-02-07 DIAGNOSIS — D689 Coagulation defect, unspecified: Secondary | ICD-10-CM | POA: Diagnosis not present

## 2020-02-07 DIAGNOSIS — T82818A Embolism of vascular prosthetic devices, implants and grafts, initial encounter: Secondary | ICD-10-CM | POA: Diagnosis not present

## 2020-02-07 DIAGNOSIS — E1022 Type 1 diabetes mellitus with diabetic chronic kidney disease: Secondary | ICD-10-CM | POA: Diagnosis not present

## 2020-02-07 DIAGNOSIS — N2581 Secondary hyperparathyroidism of renal origin: Secondary | ICD-10-CM | POA: Diagnosis not present

## 2020-02-07 DIAGNOSIS — N186 End stage renal disease: Secondary | ICD-10-CM | POA: Diagnosis not present

## 2020-02-09 DIAGNOSIS — Z20822 Contact with and (suspected) exposure to covid-19: Secondary | ICD-10-CM | POA: Diagnosis not present

## 2020-02-10 DIAGNOSIS — D631 Anemia in chronic kidney disease: Secondary | ICD-10-CM | POA: Diagnosis not present

## 2020-02-10 DIAGNOSIS — Z992 Dependence on renal dialysis: Secondary | ICD-10-CM | POA: Diagnosis not present

## 2020-02-10 DIAGNOSIS — N2581 Secondary hyperparathyroidism of renal origin: Secondary | ICD-10-CM | POA: Diagnosis not present

## 2020-02-10 DIAGNOSIS — N186 End stage renal disease: Secondary | ICD-10-CM | POA: Diagnosis not present

## 2020-02-10 DIAGNOSIS — D689 Coagulation defect, unspecified: Secondary | ICD-10-CM | POA: Diagnosis not present

## 2020-02-10 DIAGNOSIS — E1022 Type 1 diabetes mellitus with diabetic chronic kidney disease: Secondary | ICD-10-CM | POA: Diagnosis not present

## 2020-02-10 DIAGNOSIS — T82818A Embolism of vascular prosthetic devices, implants and grafts, initial encounter: Secondary | ICD-10-CM | POA: Diagnosis not present

## 2020-02-12 DIAGNOSIS — T82818A Embolism of vascular prosthetic devices, implants and grafts, initial encounter: Secondary | ICD-10-CM | POA: Diagnosis not present

## 2020-02-12 DIAGNOSIS — Z992 Dependence on renal dialysis: Secondary | ICD-10-CM | POA: Diagnosis not present

## 2020-02-12 DIAGNOSIS — D689 Coagulation defect, unspecified: Secondary | ICD-10-CM | POA: Diagnosis not present

## 2020-02-12 DIAGNOSIS — D631 Anemia in chronic kidney disease: Secondary | ICD-10-CM | POA: Diagnosis not present

## 2020-02-12 DIAGNOSIS — E1022 Type 1 diabetes mellitus with diabetic chronic kidney disease: Secondary | ICD-10-CM | POA: Diagnosis not present

## 2020-02-12 DIAGNOSIS — N2581 Secondary hyperparathyroidism of renal origin: Secondary | ICD-10-CM | POA: Diagnosis not present

## 2020-02-12 DIAGNOSIS — N186 End stage renal disease: Secondary | ICD-10-CM | POA: Diagnosis not present

## 2020-02-14 DIAGNOSIS — N2581 Secondary hyperparathyroidism of renal origin: Secondary | ICD-10-CM | POA: Diagnosis not present

## 2020-02-14 DIAGNOSIS — N186 End stage renal disease: Secondary | ICD-10-CM | POA: Diagnosis not present

## 2020-02-14 DIAGNOSIS — E1022 Type 1 diabetes mellitus with diabetic chronic kidney disease: Secondary | ICD-10-CM | POA: Diagnosis not present

## 2020-02-14 DIAGNOSIS — T82818A Embolism of vascular prosthetic devices, implants and grafts, initial encounter: Secondary | ICD-10-CM | POA: Diagnosis not present

## 2020-02-14 DIAGNOSIS — Z992 Dependence on renal dialysis: Secondary | ICD-10-CM | POA: Diagnosis not present

## 2020-02-14 DIAGNOSIS — D689 Coagulation defect, unspecified: Secondary | ICD-10-CM | POA: Diagnosis not present

## 2020-02-14 DIAGNOSIS — D631 Anemia in chronic kidney disease: Secondary | ICD-10-CM | POA: Diagnosis not present

## 2020-02-17 DIAGNOSIS — D631 Anemia in chronic kidney disease: Secondary | ICD-10-CM | POA: Diagnosis not present

## 2020-02-17 DIAGNOSIS — T82818A Embolism of vascular prosthetic devices, implants and grafts, initial encounter: Secondary | ICD-10-CM | POA: Diagnosis not present

## 2020-02-17 DIAGNOSIS — N186 End stage renal disease: Secondary | ICD-10-CM | POA: Diagnosis not present

## 2020-02-17 DIAGNOSIS — Z992 Dependence on renal dialysis: Secondary | ICD-10-CM | POA: Diagnosis not present

## 2020-02-17 DIAGNOSIS — D689 Coagulation defect, unspecified: Secondary | ICD-10-CM | POA: Diagnosis not present

## 2020-02-17 DIAGNOSIS — E1022 Type 1 diabetes mellitus with diabetic chronic kidney disease: Secondary | ICD-10-CM | POA: Diagnosis not present

## 2020-02-17 DIAGNOSIS — N2581 Secondary hyperparathyroidism of renal origin: Secondary | ICD-10-CM | POA: Diagnosis not present

## 2020-02-19 DIAGNOSIS — Z992 Dependence on renal dialysis: Secondary | ICD-10-CM | POA: Diagnosis not present

## 2020-02-19 DIAGNOSIS — N186 End stage renal disease: Secondary | ICD-10-CM | POA: Diagnosis not present

## 2020-02-19 DIAGNOSIS — D631 Anemia in chronic kidney disease: Secondary | ICD-10-CM | POA: Diagnosis not present

## 2020-02-19 DIAGNOSIS — D689 Coagulation defect, unspecified: Secondary | ICD-10-CM | POA: Diagnosis not present

## 2020-02-19 DIAGNOSIS — E1022 Type 1 diabetes mellitus with diabetic chronic kidney disease: Secondary | ICD-10-CM | POA: Diagnosis not present

## 2020-02-19 DIAGNOSIS — N2581 Secondary hyperparathyroidism of renal origin: Secondary | ICD-10-CM | POA: Diagnosis not present

## 2020-02-19 DIAGNOSIS — T82818A Embolism of vascular prosthetic devices, implants and grafts, initial encounter: Secondary | ICD-10-CM | POA: Diagnosis not present

## 2020-02-21 DIAGNOSIS — N2581 Secondary hyperparathyroidism of renal origin: Secondary | ICD-10-CM | POA: Diagnosis not present

## 2020-02-21 DIAGNOSIS — T82818A Embolism of vascular prosthetic devices, implants and grafts, initial encounter: Secondary | ICD-10-CM | POA: Diagnosis not present

## 2020-02-21 DIAGNOSIS — E1022 Type 1 diabetes mellitus with diabetic chronic kidney disease: Secondary | ICD-10-CM | POA: Diagnosis not present

## 2020-02-21 DIAGNOSIS — D689 Coagulation defect, unspecified: Secondary | ICD-10-CM | POA: Diagnosis not present

## 2020-02-21 DIAGNOSIS — N186 End stage renal disease: Secondary | ICD-10-CM | POA: Diagnosis not present

## 2020-02-21 DIAGNOSIS — D631 Anemia in chronic kidney disease: Secondary | ICD-10-CM | POA: Diagnosis not present

## 2020-02-21 DIAGNOSIS — Z992 Dependence on renal dialysis: Secondary | ICD-10-CM | POA: Diagnosis not present

## 2020-02-23 DIAGNOSIS — N186 End stage renal disease: Secondary | ICD-10-CM | POA: Diagnosis not present

## 2020-02-23 DIAGNOSIS — Z992 Dependence on renal dialysis: Secondary | ICD-10-CM | POA: Diagnosis not present

## 2020-02-23 DIAGNOSIS — T8612 Kidney transplant failure: Secondary | ICD-10-CM | POA: Diagnosis not present

## 2020-02-24 DIAGNOSIS — D689 Coagulation defect, unspecified: Secondary | ICD-10-CM | POA: Diagnosis not present

## 2020-02-24 DIAGNOSIS — N186 End stage renal disease: Secondary | ICD-10-CM | POA: Diagnosis not present

## 2020-02-24 DIAGNOSIS — Z992 Dependence on renal dialysis: Secondary | ICD-10-CM | POA: Diagnosis not present

## 2020-02-24 DIAGNOSIS — T82818A Embolism of vascular prosthetic devices, implants and grafts, initial encounter: Secondary | ICD-10-CM | POA: Diagnosis not present

## 2020-02-24 DIAGNOSIS — D509 Iron deficiency anemia, unspecified: Secondary | ICD-10-CM | POA: Diagnosis not present

## 2020-02-24 DIAGNOSIS — E162 Hypoglycemia, unspecified: Secondary | ICD-10-CM | POA: Diagnosis not present

## 2020-02-24 DIAGNOSIS — N2581 Secondary hyperparathyroidism of renal origin: Secondary | ICD-10-CM | POA: Diagnosis not present

## 2020-02-24 DIAGNOSIS — D631 Anemia in chronic kidney disease: Secondary | ICD-10-CM | POA: Diagnosis not present

## 2020-02-25 DIAGNOSIS — E1065 Type 1 diabetes mellitus with hyperglycemia: Secondary | ICD-10-CM | POA: Diagnosis not present

## 2020-02-25 DIAGNOSIS — E1051 Type 1 diabetes mellitus with diabetic peripheral angiopathy without gangrene: Secondary | ICD-10-CM | POA: Diagnosis not present

## 2020-02-25 DIAGNOSIS — E1069 Type 1 diabetes mellitus with other specified complication: Secondary | ICD-10-CM | POA: Diagnosis not present

## 2020-02-25 DIAGNOSIS — E1022 Type 1 diabetes mellitus with diabetic chronic kidney disease: Secondary | ICD-10-CM | POA: Diagnosis not present

## 2020-02-25 DIAGNOSIS — E1059 Type 1 diabetes mellitus with other circulatory complications: Secondary | ICD-10-CM | POA: Diagnosis not present

## 2020-02-25 DIAGNOSIS — Z89612 Acquired absence of left leg above knee: Secondary | ICD-10-CM | POA: Diagnosis not present

## 2020-02-25 DIAGNOSIS — E103593 Type 1 diabetes mellitus with proliferative diabetic retinopathy without macular edema, bilateral: Secondary | ICD-10-CM | POA: Diagnosis not present

## 2020-02-25 DIAGNOSIS — S98111A Complete traumatic amputation of right great toe, initial encounter: Secondary | ICD-10-CM | POA: Diagnosis not present

## 2020-02-25 DIAGNOSIS — N186 End stage renal disease: Secondary | ICD-10-CM | POA: Diagnosis not present

## 2020-02-25 DIAGNOSIS — E1040 Type 1 diabetes mellitus with diabetic neuropathy, unspecified: Secondary | ICD-10-CM | POA: Diagnosis not present

## 2020-02-25 DIAGNOSIS — S98131A Complete traumatic amputation of one right lesser toe, initial encounter: Secondary | ICD-10-CM | POA: Diagnosis not present

## 2020-02-25 DIAGNOSIS — E785 Hyperlipidemia, unspecified: Secondary | ICD-10-CM | POA: Diagnosis not present

## 2020-02-26 DIAGNOSIS — N2581 Secondary hyperparathyroidism of renal origin: Secondary | ICD-10-CM | POA: Diagnosis not present

## 2020-02-26 DIAGNOSIS — D509 Iron deficiency anemia, unspecified: Secondary | ICD-10-CM | POA: Diagnosis not present

## 2020-02-26 DIAGNOSIS — D631 Anemia in chronic kidney disease: Secondary | ICD-10-CM | POA: Diagnosis not present

## 2020-02-26 DIAGNOSIS — E10649 Type 1 diabetes mellitus with hypoglycemia without coma: Secondary | ICD-10-CM | POA: Diagnosis not present

## 2020-02-26 DIAGNOSIS — E162 Hypoglycemia, unspecified: Secondary | ICD-10-CM | POA: Diagnosis not present

## 2020-02-26 DIAGNOSIS — E108 Type 1 diabetes mellitus with unspecified complications: Secondary | ICD-10-CM | POA: Diagnosis not present

## 2020-02-26 DIAGNOSIS — E1065 Type 1 diabetes mellitus with hyperglycemia: Secondary | ICD-10-CM | POA: Diagnosis not present

## 2020-02-26 DIAGNOSIS — Z992 Dependence on renal dialysis: Secondary | ICD-10-CM | POA: Diagnosis not present

## 2020-02-26 DIAGNOSIS — Z4681 Encounter for fitting and adjustment of insulin pump: Secondary | ICD-10-CM | POA: Diagnosis not present

## 2020-02-26 DIAGNOSIS — T82818A Embolism of vascular prosthetic devices, implants and grafts, initial encounter: Secondary | ICD-10-CM | POA: Diagnosis not present

## 2020-02-26 DIAGNOSIS — N186 End stage renal disease: Secondary | ICD-10-CM | POA: Diagnosis not present

## 2020-02-26 DIAGNOSIS — D689 Coagulation defect, unspecified: Secondary | ICD-10-CM | POA: Diagnosis not present

## 2020-02-28 DIAGNOSIS — N2581 Secondary hyperparathyroidism of renal origin: Secondary | ICD-10-CM | POA: Diagnosis not present

## 2020-02-28 DIAGNOSIS — N186 End stage renal disease: Secondary | ICD-10-CM | POA: Diagnosis not present

## 2020-02-28 DIAGNOSIS — Z992 Dependence on renal dialysis: Secondary | ICD-10-CM | POA: Diagnosis not present

## 2020-02-28 DIAGNOSIS — D509 Iron deficiency anemia, unspecified: Secondary | ICD-10-CM | POA: Diagnosis not present

## 2020-02-28 DIAGNOSIS — T82818A Embolism of vascular prosthetic devices, implants and grafts, initial encounter: Secondary | ICD-10-CM | POA: Diagnosis not present

## 2020-02-28 DIAGNOSIS — D631 Anemia in chronic kidney disease: Secondary | ICD-10-CM | POA: Diagnosis not present

## 2020-02-28 DIAGNOSIS — D689 Coagulation defect, unspecified: Secondary | ICD-10-CM | POA: Diagnosis not present

## 2020-02-28 DIAGNOSIS — E162 Hypoglycemia, unspecified: Secondary | ICD-10-CM | POA: Diagnosis not present

## 2020-02-29 ENCOUNTER — Other Ambulatory Visit: Payer: Self-pay | Admitting: Cardiovascular Disease

## 2020-03-02 ENCOUNTER — Telehealth: Payer: Self-pay | Admitting: Cardiovascular Disease

## 2020-03-02 DIAGNOSIS — N186 End stage renal disease: Secondary | ICD-10-CM | POA: Diagnosis not present

## 2020-03-02 DIAGNOSIS — D509 Iron deficiency anemia, unspecified: Secondary | ICD-10-CM | POA: Diagnosis not present

## 2020-03-02 DIAGNOSIS — D631 Anemia in chronic kidney disease: Secondary | ICD-10-CM | POA: Diagnosis not present

## 2020-03-02 DIAGNOSIS — T82818A Embolism of vascular prosthetic devices, implants and grafts, initial encounter: Secondary | ICD-10-CM | POA: Diagnosis not present

## 2020-03-02 DIAGNOSIS — N2581 Secondary hyperparathyroidism of renal origin: Secondary | ICD-10-CM | POA: Diagnosis not present

## 2020-03-02 DIAGNOSIS — E162 Hypoglycemia, unspecified: Secondary | ICD-10-CM | POA: Diagnosis not present

## 2020-03-02 DIAGNOSIS — D689 Coagulation defect, unspecified: Secondary | ICD-10-CM | POA: Diagnosis not present

## 2020-03-02 DIAGNOSIS — Z992 Dependence on renal dialysis: Secondary | ICD-10-CM | POA: Diagnosis not present

## 2020-03-02 MED ORDER — PRASUGREL HCL 10 MG PO TABS: 10.0000 mg | ORAL_TABLET | Freq: Every day | ORAL | 0 refills | Status: DC

## 2020-03-02 NOTE — Telephone Encounter (Signed)
Pt's medication was resent to pt's pharmacy for a 90 day supply as requested. Confirmation received.

## 2020-03-02 NOTE — Telephone Encounter (Signed)
°*  STAT* If patient is at the pharmacy, call can be transferred to refill team.   1. Which medications need to be refilled? (please list name of each medication and dose if known) prasugrel (EFFIENT) 10 MG TABS tablet  2. Which pharmacy/location (including street and city if local pharmacy) is medication to be sent to? [place] [address] [place]  3. Do they need a 30 day or 90 day supply? 90   Patient is scheduled for 05/11/20 with Richardson Dopp

## 2020-03-03 DIAGNOSIS — K5909 Other constipation: Secondary | ICD-10-CM | POA: Diagnosis not present

## 2020-03-03 DIAGNOSIS — R110 Nausea: Secondary | ICD-10-CM | POA: Diagnosis not present

## 2020-03-03 DIAGNOSIS — K219 Gastro-esophageal reflux disease without esophagitis: Secondary | ICD-10-CM | POA: Diagnosis not present

## 2020-03-04 DIAGNOSIS — N186 End stage renal disease: Secondary | ICD-10-CM | POA: Diagnosis not present

## 2020-03-04 DIAGNOSIS — T82818A Embolism of vascular prosthetic devices, implants and grafts, initial encounter: Secondary | ICD-10-CM | POA: Diagnosis not present

## 2020-03-04 DIAGNOSIS — Z992 Dependence on renal dialysis: Secondary | ICD-10-CM | POA: Diagnosis not present

## 2020-03-04 DIAGNOSIS — D689 Coagulation defect, unspecified: Secondary | ICD-10-CM | POA: Diagnosis not present

## 2020-03-04 DIAGNOSIS — D631 Anemia in chronic kidney disease: Secondary | ICD-10-CM | POA: Diagnosis not present

## 2020-03-04 DIAGNOSIS — E162 Hypoglycemia, unspecified: Secondary | ICD-10-CM | POA: Diagnosis not present

## 2020-03-04 DIAGNOSIS — D509 Iron deficiency anemia, unspecified: Secondary | ICD-10-CM | POA: Diagnosis not present

## 2020-03-04 DIAGNOSIS — N2581 Secondary hyperparathyroidism of renal origin: Secondary | ICD-10-CM | POA: Diagnosis not present

## 2020-03-06 DIAGNOSIS — T82818A Embolism of vascular prosthetic devices, implants and grafts, initial encounter: Secondary | ICD-10-CM | POA: Diagnosis not present

## 2020-03-06 DIAGNOSIS — Z992 Dependence on renal dialysis: Secondary | ICD-10-CM | POA: Diagnosis not present

## 2020-03-06 DIAGNOSIS — N2581 Secondary hyperparathyroidism of renal origin: Secondary | ICD-10-CM | POA: Diagnosis not present

## 2020-03-06 DIAGNOSIS — D509 Iron deficiency anemia, unspecified: Secondary | ICD-10-CM | POA: Diagnosis not present

## 2020-03-06 DIAGNOSIS — D689 Coagulation defect, unspecified: Secondary | ICD-10-CM | POA: Diagnosis not present

## 2020-03-06 DIAGNOSIS — N186 End stage renal disease: Secondary | ICD-10-CM | POA: Diagnosis not present

## 2020-03-06 DIAGNOSIS — D631 Anemia in chronic kidney disease: Secondary | ICD-10-CM | POA: Diagnosis not present

## 2020-03-06 DIAGNOSIS — E162 Hypoglycemia, unspecified: Secondary | ICD-10-CM | POA: Diagnosis not present

## 2020-03-09 DIAGNOSIS — D509 Iron deficiency anemia, unspecified: Secondary | ICD-10-CM | POA: Diagnosis not present

## 2020-03-09 DIAGNOSIS — E162 Hypoglycemia, unspecified: Secondary | ICD-10-CM | POA: Diagnosis not present

## 2020-03-09 DIAGNOSIS — N2581 Secondary hyperparathyroidism of renal origin: Secondary | ICD-10-CM | POA: Diagnosis not present

## 2020-03-09 DIAGNOSIS — Z992 Dependence on renal dialysis: Secondary | ICD-10-CM | POA: Diagnosis not present

## 2020-03-09 DIAGNOSIS — D631 Anemia in chronic kidney disease: Secondary | ICD-10-CM | POA: Diagnosis not present

## 2020-03-09 DIAGNOSIS — T82818A Embolism of vascular prosthetic devices, implants and grafts, initial encounter: Secondary | ICD-10-CM | POA: Diagnosis not present

## 2020-03-09 DIAGNOSIS — N186 End stage renal disease: Secondary | ICD-10-CM | POA: Diagnosis not present

## 2020-03-09 DIAGNOSIS — D689 Coagulation defect, unspecified: Secondary | ICD-10-CM | POA: Diagnosis not present

## 2020-03-11 DIAGNOSIS — D509 Iron deficiency anemia, unspecified: Secondary | ICD-10-CM | POA: Diagnosis not present

## 2020-03-11 DIAGNOSIS — Z992 Dependence on renal dialysis: Secondary | ICD-10-CM | POA: Diagnosis not present

## 2020-03-11 DIAGNOSIS — N186 End stage renal disease: Secondary | ICD-10-CM | POA: Diagnosis not present

## 2020-03-11 DIAGNOSIS — E162 Hypoglycemia, unspecified: Secondary | ICD-10-CM | POA: Diagnosis not present

## 2020-03-11 DIAGNOSIS — D631 Anemia in chronic kidney disease: Secondary | ICD-10-CM | POA: Diagnosis not present

## 2020-03-11 DIAGNOSIS — N2581 Secondary hyperparathyroidism of renal origin: Secondary | ICD-10-CM | POA: Diagnosis not present

## 2020-03-11 DIAGNOSIS — T82818A Embolism of vascular prosthetic devices, implants and grafts, initial encounter: Secondary | ICD-10-CM | POA: Diagnosis not present

## 2020-03-11 DIAGNOSIS — D689 Coagulation defect, unspecified: Secondary | ICD-10-CM | POA: Diagnosis not present

## 2020-03-13 DIAGNOSIS — D631 Anemia in chronic kidney disease: Secondary | ICD-10-CM | POA: Diagnosis not present

## 2020-03-13 DIAGNOSIS — T82818A Embolism of vascular prosthetic devices, implants and grafts, initial encounter: Secondary | ICD-10-CM | POA: Diagnosis not present

## 2020-03-13 DIAGNOSIS — E162 Hypoglycemia, unspecified: Secondary | ICD-10-CM | POA: Diagnosis not present

## 2020-03-13 DIAGNOSIS — D689 Coagulation defect, unspecified: Secondary | ICD-10-CM | POA: Diagnosis not present

## 2020-03-13 DIAGNOSIS — N186 End stage renal disease: Secondary | ICD-10-CM | POA: Diagnosis not present

## 2020-03-13 DIAGNOSIS — N2581 Secondary hyperparathyroidism of renal origin: Secondary | ICD-10-CM | POA: Diagnosis not present

## 2020-03-13 DIAGNOSIS — Z992 Dependence on renal dialysis: Secondary | ICD-10-CM | POA: Diagnosis not present

## 2020-03-13 DIAGNOSIS — D509 Iron deficiency anemia, unspecified: Secondary | ICD-10-CM | POA: Diagnosis not present

## 2020-03-15 DIAGNOSIS — N2581 Secondary hyperparathyroidism of renal origin: Secondary | ICD-10-CM | POA: Diagnosis not present

## 2020-03-15 DIAGNOSIS — D509 Iron deficiency anemia, unspecified: Secondary | ICD-10-CM | POA: Diagnosis not present

## 2020-03-15 DIAGNOSIS — N186 End stage renal disease: Secondary | ICD-10-CM | POA: Diagnosis not present

## 2020-03-15 DIAGNOSIS — D689 Coagulation defect, unspecified: Secondary | ICD-10-CM | POA: Diagnosis not present

## 2020-03-15 DIAGNOSIS — Z992 Dependence on renal dialysis: Secondary | ICD-10-CM | POA: Diagnosis not present

## 2020-03-15 DIAGNOSIS — E162 Hypoglycemia, unspecified: Secondary | ICD-10-CM | POA: Diagnosis not present

## 2020-03-15 DIAGNOSIS — T82818A Embolism of vascular prosthetic devices, implants and grafts, initial encounter: Secondary | ICD-10-CM | POA: Diagnosis not present

## 2020-03-15 DIAGNOSIS — D631 Anemia in chronic kidney disease: Secondary | ICD-10-CM | POA: Diagnosis not present

## 2020-03-16 DIAGNOSIS — L03221 Cellulitis of neck: Secondary | ICD-10-CM | POA: Diagnosis not present

## 2020-03-17 DIAGNOSIS — N2581 Secondary hyperparathyroidism of renal origin: Secondary | ICD-10-CM | POA: Diagnosis not present

## 2020-03-17 DIAGNOSIS — D631 Anemia in chronic kidney disease: Secondary | ICD-10-CM | POA: Diagnosis not present

## 2020-03-17 DIAGNOSIS — N186 End stage renal disease: Secondary | ICD-10-CM | POA: Diagnosis not present

## 2020-03-17 DIAGNOSIS — D509 Iron deficiency anemia, unspecified: Secondary | ICD-10-CM | POA: Diagnosis not present

## 2020-03-17 DIAGNOSIS — D689 Coagulation defect, unspecified: Secondary | ICD-10-CM | POA: Diagnosis not present

## 2020-03-17 DIAGNOSIS — T82818A Embolism of vascular prosthetic devices, implants and grafts, initial encounter: Secondary | ICD-10-CM | POA: Diagnosis not present

## 2020-03-17 DIAGNOSIS — E162 Hypoglycemia, unspecified: Secondary | ICD-10-CM | POA: Diagnosis not present

## 2020-03-17 DIAGNOSIS — Z992 Dependence on renal dialysis: Secondary | ICD-10-CM | POA: Diagnosis not present

## 2020-03-18 DIAGNOSIS — Z794 Long term (current) use of insulin: Secondary | ICD-10-CM | POA: Diagnosis not present

## 2020-03-18 DIAGNOSIS — E108 Type 1 diabetes mellitus with unspecified complications: Secondary | ICD-10-CM | POA: Diagnosis not present

## 2020-03-18 DIAGNOSIS — E1059 Type 1 diabetes mellitus with other circulatory complications: Secondary | ICD-10-CM | POA: Diagnosis not present

## 2020-03-18 DIAGNOSIS — E1040 Type 1 diabetes mellitus with diabetic neuropathy, unspecified: Secondary | ICD-10-CM | POA: Diagnosis not present

## 2020-03-20 ENCOUNTER — Encounter (HOSPITAL_COMMUNITY): Payer: Self-pay | Admitting: Emergency Medicine

## 2020-03-20 ENCOUNTER — Other Ambulatory Visit: Payer: Self-pay

## 2020-03-20 ENCOUNTER — Emergency Department (HOSPITAL_COMMUNITY)
Admission: EM | Admit: 2020-03-20 | Discharge: 2020-03-20 | Disposition: A | Discharge status: Home / Self Care | Payer: Medicare Other | Attending: Emergency Medicine | Admitting: Emergency Medicine

## 2020-03-20 ENCOUNTER — Emergency Department (HOSPITAL_COMMUNITY): Payer: Medicare Other

## 2020-03-20 DIAGNOSIS — Z7982 Long term (current) use of aspirin: Secondary | ICD-10-CM | POA: Insufficient documentation

## 2020-03-20 DIAGNOSIS — M2548 Effusion, other site: Secondary | ICD-10-CM | POA: Diagnosis not present

## 2020-03-20 DIAGNOSIS — N2581 Secondary hyperparathyroidism of renal origin: Secondary | ICD-10-CM | POA: Diagnosis not present

## 2020-03-20 DIAGNOSIS — I5032 Chronic diastolic (congestive) heart failure: Secondary | ICD-10-CM | POA: Insufficient documentation

## 2020-03-20 DIAGNOSIS — E039 Hypothyroidism, unspecified: Secondary | ICD-10-CM | POA: Insufficient documentation

## 2020-03-20 DIAGNOSIS — D689 Coagulation defect, unspecified: Secondary | ICD-10-CM | POA: Diagnosis not present

## 2020-03-20 DIAGNOSIS — N186 End stage renal disease: Secondary | ICD-10-CM | POA: Insufficient documentation

## 2020-03-20 DIAGNOSIS — I2510 Atherosclerotic heart disease of native coronary artery without angina pectoris: Secondary | ICD-10-CM | POA: Insufficient documentation

## 2020-03-20 DIAGNOSIS — Z7984 Long term (current) use of oral hypoglycemic drugs: Secondary | ICD-10-CM | POA: Insufficient documentation

## 2020-03-20 DIAGNOSIS — I132 Hypertensive heart and chronic kidney disease with heart failure and with stage 5 chronic kidney disease, or end stage renal disease: Secondary | ICD-10-CM | POA: Insufficient documentation

## 2020-03-20 DIAGNOSIS — Z992 Dependence on renal dialysis: Secondary | ICD-10-CM | POA: Diagnosis not present

## 2020-03-20 DIAGNOSIS — Z79899 Other long term (current) drug therapy: Secondary | ICD-10-CM | POA: Diagnosis not present

## 2020-03-20 DIAGNOSIS — T82818A Embolism of vascular prosthetic devices, implants and grafts, initial encounter: Secondary | ICD-10-CM | POA: Diagnosis not present

## 2020-03-20 DIAGNOSIS — H7093 Unspecified mastoiditis, bilateral: Secondary | ICD-10-CM | POA: Insufficient documentation

## 2020-03-20 DIAGNOSIS — M542 Cervicalgia: Secondary | ICD-10-CM | POA: Diagnosis not present

## 2020-03-20 DIAGNOSIS — I6529 Occlusion and stenosis of unspecified carotid artery: Secondary | ICD-10-CM | POA: Diagnosis not present

## 2020-03-20 DIAGNOSIS — I708 Atherosclerosis of other arteries: Secondary | ICD-10-CM | POA: Diagnosis not present

## 2020-03-20 DIAGNOSIS — D631 Anemia in chronic kidney disease: Secondary | ICD-10-CM | POA: Diagnosis not present

## 2020-03-20 DIAGNOSIS — R221 Localized swelling, mass and lump, neck: Secondary | ICD-10-CM

## 2020-03-20 DIAGNOSIS — D509 Iron deficiency anemia, unspecified: Secondary | ICD-10-CM | POA: Diagnosis not present

## 2020-03-20 DIAGNOSIS — E162 Hypoglycemia, unspecified: Secondary | ICD-10-CM | POA: Diagnosis not present

## 2020-03-20 DIAGNOSIS — I672 Cerebral atherosclerosis: Secondary | ICD-10-CM | POA: Diagnosis not present

## 2020-03-20 DIAGNOSIS — Z794 Long term (current) use of insulin: Secondary | ICD-10-CM | POA: Diagnosis not present

## 2020-03-20 NOTE — ED Provider Notes (Signed)
[facility] EMERGENCY DEPARTMENT Provider Note   CSN: [id number] Arrival date & time: 03/20/20  1044     History Chief Complaint  Patient presents with  . Neck Pain    Monta Police is a 54 y.o. male.  HPI   54 year old male on dialysis, history of diabetes, presents complaining of right posterior neck pain that began several weeks ago.  Was present on awakening and he thought he slept wrong.  Pain is localized to the right posterior lateral neck.  He has not noted any redness thinks it may be some swelling.  He was seen at the urgent care center.  I told him that they could not get imaging that was see the soft tissue.  He has continued to take his home pain medication now just Dilaudid for this pain.  He denies any radiation, numbness, or tingling that is different from baseline.  He has not noted any injury.  He is speaking, swallowing, breathing without difficulty.  Past Medical History:  Diagnosis Date  . Anal fissure   . Anemia of chronic disease   . Arthritis    "hands, right knee" (03/19/2014) no cartlidge in right knee  . CAD (coronary artery disease)    a. Abnl nuc ->LHC (11/15):  mid to dist Dx 80%, mid RCA 99% (functional CTO), dist RCA 80% >> PCI: balloon angioplasty to mid RCA (could not deliver stent) - staged PCI Rotoblator atherectomy/DES to [facility]. b. NSTEMI 12/2015 s/p DES to diagonal.  . Cholecystitis    a. 08/27/2011  . Chronic diastolic CHF (congestive heart failure) ([place])    a. Echo 3/13:  EF 55-60%;  b. Echo (11/15):  Mild LVH, EF 60-65%, no RWMA, Gr 1 DD, MAC, mild LAE, normal RVF, mild RAE;  c.  Echo 5/16:  severe LVH, EF 55-60%, no RWMA, Gr 1 DD, MAC, mild LAE  . Claustrophobia    when things get around his face.   . Complication of anesthesia   . Difficult intubation    only once in 2015 at [facility] haven't had a problem since then  . Diverticulosis   . Esophageal dysmotility   . ESRD (end stage renal disease) ([place])    a. 1995  s/p cadaveric transplant w/ susbequent failure after 18 yrs;  b.Dialysis initiated 07/2011 [facility] M-W-F  . Fibromyalgia   . Gastroparesis   . GERD (gastroesophageal reflux disease)   . Gout    PMH  . History of blood transfusion   . History of kidney stones    early 1990's  . HLD (hyperlipidemia)   . Hypertension   . Hypothyroidism   . Inguinal lymphadenopathy    a. bilateral - s/p biopsy 07/2011  . Insulin dependent diabetes mellitus    Type I  . Monilial esophagitis ([place])   . Morbid obesity ([place])   . OSA (obstructive sleep apnea)    adjustable bed and uses O2 L at bedtime  . PAD (peripheral artery disease) (H[facility])    a. s/p L BKA.  [name] Kitchen Pericardial effusion    a.  Small by CT 08/22/11;  b.  Large by CT 08/27/11. c. Not seen on 11/2015 echo.  . Pneumonia ~ 2007  . PONV (postoperative nausea and vomiting)    vomited once after a procedure.  . T1DM (type 1 diabetes mellitus) ([place])   . Tracheobronchomalacia     Patient Active Problem List   Diagnosis Date Noted  . Lymph node enlargement 08/13/2018  .  Clotted renal dialysis arteriovenous graft ([place]) 08/02/2018  . Pressure injury of skin 08/02/2018  . Upper airway cough syndrome 08/11/2017  . Morbid obesity due to excess calories ([place]) 08/11/2017  . Chronic respiratory failure with hypoxia ([place]) 08/10/2017  . GERD (gastroesophageal reflux disease) 07/29/2017  . Rash and nonspecific skin eruption 07/27/2017  . Contact dermatitis 07/27/2017  . ESRD on hemodialysis ([place])   . Sepsis ([place]) 07/12/2017  . T1DM (type 1 diabetes mellitus) ([place]) 07/12/2017  . Hypotension (arterial) 10/15/2016  . Heme positive stool   . IDDM with complications 95/28/4132  . Oxygen dependent 03/24/2016  . Visit for preventive health examination 03/24/2016  . Chronic diastolic heart failure ([place]) 02/25/2016  . Tracheobronchomalacia 02/04/2016  . Anemia of chronic disease   . Iron deficiency anemia   . Gastrointestinal hemorrhage with melena 12/21/2015    . Pulmonary edema 03/09/2015  . Chronic pain 03/09/2015  . Hyperkalemia 03/09/2015  . HLD (hyperlipidemia) 11/27/2014  . ESRD on dialysis ([place]) 09/30/2014  . Coronary artery disease involving native coronary artery of native heart with unstable angina pectoris ([place]) 03/20/2014  . Allergic reaction to dye 03/20/2014  . NSTEMI (non-ST elevated myocardial infarction) ([place]) 03/19/2014  . HTN (hypertension) 02/18/2014  . ESRD (end stage renal disease) ([place]) 08/27/2011  . Abdominal pain 08/27/2011  . Pericardial effusion 08/27/2011  . History of renal transplant 07/22/2011  . S/P BKA (below knee amputation) ([place]) 07/22/2011  . OSA (obstructive sleep apnea) 07/22/2011  . DOE (dyspnea on exertion) 07/22/2011    Past Surgical History:  Procedure Laterality Date  . A/V FISTULAGRAM Left 07/26/2016   Procedure: A/V Fistulagram;  Surgeon: Waynetta Sandy, MD;  Location: [facility] CV LAB;  Service: Cardiovascular;  Laterality: Left;  . A/V FISTULAGRAM N/A 07/11/2017   Procedure: A/V FISTULAGRAM - Left AV;  Surgeon: Waynetta Sandy, MD;  Location: [facility] CV LAB;  Service: Cardiovascular;  Laterality: N/A;  . AMPUTATION OF REPLICATED TOES Right   . ANGIOPLASTY  04/09/2014   RCA   . APPENDECTOMY  ~ 2006  . ARTERIOVENOUS GRAFT PLACEMENT Left 1993?   forearm  . ARTERIOVENOUS GRAFT PLACEMENT Right 1993?   leg  . ARTERIOVENOUS GRAFT PLACEMENT Left    Thigh  . Arteriovenous Graft Removed Left    Thigh  . AV FISTULA PLACEMENT  07/29/2011   Procedure: ARTERIOVENOUS (AV) FISTULA CREATION;  Surgeon: Mal Misty, MD;  Location: [place];  Service: Vascular;  Laterality: Right;  Brachial cephalic  . AV FISTULA PLACEMENT Left 01/20/2015   Procedure: LIGATION OF RIGHT ARTERIOVENOUS FISTULA WITH EXCISION OF ANEURYSM;  Surgeon: Angelia Mould, MD;  Location: [place] [facility] [place];  Service: Vascular;  Laterality: Left;  . AV FISTULA PLACEMENT Left 04/30/2015   Procedure: CREATION OF LEFT ARM  BRACHIO-CEPHALIC ARTERIOVENOUS (AV) FISTULA ;  Surgeon: Angelia Mould, MD;  Location: [place];  Service: Vascular;  Laterality: Left;  . AV Graft PLACEMENT Left 1993?   "attempted one in my wrist; didn't take"  . BASCILIC VEIN TRANSPOSITION Left 11/01/2018   Procedure: FIRST STAGE BASILIC VEIN TRANSPOSITION  LEFT ARM;  Surgeon: Serafina Mitchell, MD;  Location: [place];  Service: Vascular;  Laterality: Left;  . BASCILIC VEIN TRANSPOSITION Left 12/27/2018   Procedure: SECOND STAGE BASILIC VEIN TRANSPOSITION  LEFT ARM;  Surgeon: Serafina Mitchell, MD;  Location: [facility];  Service: Vascular;  Laterality: Left;  . BELOW KNEE LEG AMPUTATION Left 2010  . CARDIAC CATHETERIZATION  03/19/2014  . CARDIAC CATHETERIZATION  03/19/2014   Procedure: CORONARY BALLOON ANGIOPLASTY;  Surgeon: Burnell Blanks, MD;  Location: [facility] CATH LAB;  Service: Cardiovascular;;  . CARDIAC CATHETERIZATION N/A 01/01/2016   Procedure: Left Heart Cath and Coronary Angiography;  Surgeon: Troy Sine, MD;  Location: [facility] CV LAB;  Service: Cardiovascular;  Laterality: N/A;  . CARDIAC CATHETERIZATION N/A 01/01/2016   Procedure: Coronary Stent Intervention;  Surgeon: Troy Sine, MD;  Location: [place] CV LAB;  Service: Cardiovascular;  Laterality: N/A;  . CARPAL TUNNEL RELEASE Bilateral after 2005  . CATARACT EXTRACTION W/ INTRAOCULAR LENS  IMPLANT, BILATERAL Bilateral 1990's  . COLONOSCOPY  08/29/2011   Procedure: COLONOSCOPY;  Surgeon: Jerene Bears, MD;  Location: [place];  Service: Gastroenterology;  Laterality: N/A;  . COLONOSCOPY WITH PROPOFOL N/A 04/26/2016   Procedure: COLONOSCOPY WITH PROPOFOL;  Surgeon: Jerene Bears, MD;  Location: [facility] ENDOSCOPY;  Service: Gastroenterology;  Laterality: N/A;  . CORONARY ANGIOPLASTY WITH STENT PLACEMENT  04/09/2014       ptca/des mid lad   . ESOPHAGOGASTRODUODENOSCOPY N/A 12/25/2015   Procedure: ESOPHAGOGASTRODUODENOSCOPY (EGD);  Surgeon: Mauri Pole, MD;  Location: [facility]  ENDOSCOPY;  Service: Endoscopy;  Laterality: N/A;  bedside  . ESOPHAGOGASTRODUODENOSCOPY (EGD) WITH PROPOFOL N/A 04/26/2016   Procedure: ESOPHAGOGASTRODUODENOSCOPY (EGD) WITH PROPOFOL;  Surgeon: Jerene Bears, MD;  Location: [facility] ENDOSCOPY;  Service: Gastroenterology;  Laterality: N/A;  . EYE SURGERY Bilateral    cataract  . FEMORAL ARTERY REPAIR  04/09/2014   ANGIOSEAL  . INSERTION OF DIALYSIS CATHETER  08/01/2011   Procedure: INSERTION OF DIALYSIS CATHETER;  Surgeon: Rosetta Posner, MD;  Location: [facility];  Service: Vascular;  Laterality: Right;  insertion of dialysis catheter on right internal jugular vein  . INSERTION OF DIALYSIS CATHETER Right 01/20/2015   Procedure: INSERTION OF DIALYSIS CATHETER - RIGHT INTERNAL JUGULAR ;  Surgeon: Angelia Mould, MD;  Location: [facility];  Service: Vascular;  Laterality: Right;  . INSERTION OF DIALYSIS CATHETER Right 08/02/2018   Procedure: Insertion Of Dialysis Catheter;  Surgeon: Angelia Mould, MD;  Location[facility];  Service: Vascular;  Laterality: Right;  . KIDNEY TRANSPLANT  04/25/1993  . LEFT HEART CATHETERIZATION WITH CORONARY ANGIOGRAM N/A 03/19/2014   Procedure: LEFT HEART CATHETERIZATION WITH CORONARY ANGIOGRAM;  Surgeon: Burnell Blanks, MD;  Location: [facility] CATH LAB;  Service: Cardiovascular;  Laterality: N/A;  . LIGATION OF ARTERIOVENOUS  FISTULA Left 11/01/2018   Procedure: Ligation Of  Brachio Cephalic Arteriovenous  Fistula;  Surgeon: Serafina Mitchell, MD;  Location: [facility] OR;  Service: Vascular;  Laterality: Left;  . LIGATION OF ARTERIOVENOUS  FISTULA Left 02/19/2019   Procedure: LIGATION OF ARTERIOVENOUS  FISTULA LEFT ARM;  Surgeon: Angelia Mould, MD;  Location: [place];  Service: Vascular;  Laterality: Left;  . LYMPH NODE BIOPSY  08/10/2011   Procedure: LYMPH NODE BIOPSY;  Surgeon: Merrie Roof, MD;  Location: [place];  Service: General;  Laterality: Left;  left groin lymph biopsy  . NEUROPLASTY / TRANSPOSITION ULNAR NERVE AT ELBOW  Left after 2005  . PATCH ANGIOPLASTY Left 08/09/2016   Procedure: LEFT  ARTERIOVENOUS FISTULA PATCH ANGIOPLASTY;  Surgeon: Waynetta Sandy, MD;  Location: [place];  Service: Vascular;  Laterality: Left;  . PERCUTANEOUS CORONARY ROTOBLATOR INTERVENTION (PCI-R) N/A 04/09/2014   Procedure: PERCUTANEOUS CORONARY ROTOBLATOR INTERVENTION (PCI-R);  Surgeon: Burnell Blanks, MD;  Location: [facility] CATH LAB;  Service: Cardiovascular;  Laterality: N/A;  . PERIPHERAL VASCULAR BALLOON ANGIOPLASTY  07/11/2017  Procedure: PERIPHERAL VASCULAR BALLOON ANGIOPLASTY;  Surgeon: Waynetta Sandy, MD;  Location: [place] CV LAB;  Service: Cardiovascular;;  left arm AV fistula  . PERIPHERAL VASCULAR CATHETERIZATION N/A 12/25/2014   Procedure: Upper Extremity Venography;  Surgeon: Conrad [place], MD;  Location: [place] CV LAB;  Service: Cardiovascular;  Laterality: N/A;  . PERIPHERAL VASCULAR CATHETERIZATION Left 02/24/2016   Procedure: Fistulagram;  Surgeon: Waynetta Sandy, MD;  Location: [place] CV LAB;  Service: Cardiovascular;  Laterality: Left;  . PERIPHERAL VASCULAR CATHETERIZATION Left 02/24/2016   Procedure: Peripheral Vascular Intervention;  Surgeon: Waynetta Sandy, MD;  Location: [place] CV LAB;  Service: Cardiovascular;  Laterality: Left;  AV FISTULA  . PERITONEAL CATHETER INSERTION    . PERITONEAL CATHETER REMOVAL    . REVISON OF ARTERIOVENOUS FISTULA Right 3/71/0626   Procedure: PLICATION / REVISION OF ARTERIOVENOUS FISTULA;  Surgeon: Angelia Mould, MD;  Location: [place];  Service: Vascular;  Laterality: Right;  . REVISON OF ARTERIOVENOUS FISTULA Left 09/01/2015   Procedure: SUPERFICIALIZATION OF LEFT ARM BRACHIOCEPHALIC ARTERIOVENOUS FISTULA;  Surgeon: Angelia Mould, MD;  Location: [place];  Service: Vascular;  Laterality: Left;  . REVISON OF ARTERIOVENOUS FISTULA Left 08/09/2016   Procedure: REVISION OF ARTERIAL ANASTOMOSIS OF ARTERIOVENOUS FISTULA;   Surgeon: Waynetta Sandy, MD;  Location: [place];  Service: Vascular;  Laterality: Left;  . SHOULDER ARTHROSCOPY Left ~ 2006   frozen  . THROMBECTOMY W/ EMBOLECTOMY Left 08/02/2018   Procedure: THROMBECTOMY ARTERIOVENOUS FISTULA;  Surgeon: Angelia Mould, MD;  Location: [facility];  Service: Vascular;  Laterality: Left;  . TOE AMPUTATION Bilateral after 2005  . UMBILICAL HERNIA REPAIR  1990's  . UPPER EXTREMITY VENOGRAPHY Bilateral 10/16/2018   Procedure: UPPER EXTREMITY VENOGRAPHY;  Surgeon: Serafina Mitchell, MD;  Location: [place] CV LAB;  Service: Cardiovascular;  Laterality: Bilateral;  . VENOGRAM Right 07/28/2011   Procedure: VENOGRAM;  Surgeon: Conrad So-Hi, MD;  Location: [facility] CATH LAB;  Service: Cardiovascular;  Laterality: Right;  . VITRECTOMY Bilateral        Family History  Problem Relation Age of Onset  . Colon polyps Father   . Diabetes Father   . Hypertension Father   . Hypertension Mother   . Diabetes Mother   . Hyperlipidemia Mother   . Breast cancer Maternal Aunt   . Bone cancer Maternal Aunt   . Liver cancer Maternal Aunt   . Malignant hyperthermia Neg Hx   . Heart attack Neg Hx   . Stroke Neg Hx   . Colon cancer Neg Hx   . Stomach cancer Neg Hx   . Pancreatic cancer Neg Hx   . Esophageal cancer Neg Hx     Social History   Tobacco Use  . Smoking status: Never Smoker  . Smokeless tobacco: Never Used  Vaping Use  . Vaping Use: Never used  Substance Use Topics  . Alcohol use: Yes    Comment: rare  . Drug use: No    Home Medications Prior to Admission medications   Medication Sig Start Date End Date Taking? Authorizing Provider  acetaminophen (TYLENOL) 500 MG tablet Take 1,000 mg by mouth every 6 (six) hours as needed (pain/headaches.).     [provider]  allopurinol (ZYLOPRIM) 100 MG tablet TAKE 2 TABLETS BY MOUTH DAILY 05/23/19   Bonnita Hollow, MD  aspirin EC 81 MG tablet Take 81 mg by mouth at bedtime.    [provider]  atorvastatin (LIPITOR)  20 MG tablet Take 20 mg by mouth daily. 02/12/19   [provider]  B Complex-C-Folic Acid (DIALYVITE 124) 0.8 MG TABS Take 1 tablet by mouth daily.    [provider]  calcium acetate (PHOSLO) 667 MG capsule Take 667-1,334 mg by mouth See admin instructions. Take 2 capsules (1334 mg) with each meal and 1 capsule (667 mg) with each snack 09/19/12   [provider]  cetirizine (ZYRTEC) 10 MG tablet Take 10 mg by mouth daily.    [provider]  diphenhydrAMINE (BENADRYL) 25 MG tablet Take 50 mg by mouth at bedtime.     [provider]  docusate sodium (COLACE) 100 MG capsule Take 100 mg by mouth 2 (two) times daily.     [provider]  famotidine (PEPCID) 20 MG tablet Take 20 mg by mouth daily as needed for heartburn or indigestion.     [provider]  fluticasone (FLONASE) 50 MCG/ACT nasal spray Place 2 sprays into both nostrils daily as needed for allergies.     [provider]  gabapentin (NEURONTIN) 300 MG capsule Take 300 mg by mouth at bedtime.    [provider]  GLUCAGON EMERGENCY 1 MG injection Inject 1 mg into the muscle daily as needed (low blood sugar).  01/19/12   [provider]  Glucose-Cholecalciferol (RELION GLUCOSE SHOT) LIQD Take 1 Dose by mouth daily as needed (low blood sugar).    [provider]  hydrocortisone 2.5 % cream Apply 1 application topically 2 (two) times daily as needed (rash / itching).    [provider]  HYDROmorphone (DILAUDID) 2 MG tablet Take 2 mg by mouth 2 (two) times daily as needed for moderate pain or severe pain.     [provider]  insulin lispro (HUMALOG) 100 UNIT/ML injection Inject 75-120 Units into the skin continuous. Per insulin pump    [provider]  levothyroxine (SYNTHROID, LEVOTHROID) 112 MCG tablet Take 112 mcg by mouth at bedtime.     [provider]  loperamide  (IMODIUM A-D) 2 MG tablet Take 2-4 mg by mouth 4 (four) times daily as needed for diarrhea or loose stools.    [provider]  Melatonin 5 MG TABS Take 5 mg by mouth at bedtime.    [provider]  metoCLOPramide (REGLAN) 10 MG tablet Take 10 mg by mouth 4 (four) times daily -  before meals and at bedtime.     [provider]  midodrine (PROAMATINE) 10 MG tablet Take 1 tablet (10 mg total) by mouth 3 (three) times daily. Takes 1 tab in mornings of dialysis and takes 2 tabs 30 mins before starting dialysis (m,w,f) Patient taking differently: Take 10-20 mg by mouth See admin instructions. Take 2 tablets (20 mg) 1 before dialysis treatment on Mon, Wed, and Fri & 1 tablet (10 mg) by mouth as needed for low blood pressure 04/14/16   Elgergawy, Silver Huguenin, MD  nitroGLYCERIN (NITROSTAT) 0.4 MG SL tablet Place 1 tablet (0.4 mg total) under the tongue every 5 (five) minutes as needed for chest pain. 02/15/16   Burnell Blanks, MD  Nutritional Supplements (FEEDING SUPPLEMENT, NEPRO CARB STEADY,) LIQD Take 237 mLs by mouth daily. Patient taking differently: Take 237 mLs by mouth 3 (three) times a week.  01/04/16   Florencia Reasons, MD  nystatin (NYSTATIN) powder Apply 1 g topically daily as needed (yeast).    [provider]  Polyethyl Glycol-Propyl Glycol (SYSTANE ULTRA PF OP) Place  1 drop into both eyes every 2 (two) hours as needed (dry eyes).    [provider]  Polyethyl Glycol-Propyl Glycol (SYSTANE) 0.4-0.3 % SOLN Place 1 drop into both eyes 4 (four) times daily.     [provider]  polyethylene glycol (MIRALAX / GLYCOLAX) packet Take 17 g by mouth daily. Patient taking differently: Take 17 g by mouth daily as needed for mild constipation.  02/06/16   Regalado, Belkys A, MD  prasugrel (EFFIENT) 10 MG TABS tablet Take 1 tablet (10 mg total) by mouth daily. Please keep upcoming appt in January 2022 before anymore refills. Thank you 03/02/20   Burnell Blanks, MD  promethazine (PHENERGAN) 25 MG tablet Take 25 mg by mouth every 6 (six) hours as needed for nausea or vomiting.    [provider]  rOPINIRole (REQUIP) 2 MG tablet Take 1 tablet (2 mg total) by mouth at bedtime. Take 2 mg by mouth every night at bedtime Patient taking differently: Take 2 mg by mouth daily with supper.  11/01/17 02/13/20  Bonnita Hollow, MD  testosterone cypionate (DEPOTESTOSTERONE CYPIONATE) 200 MG/ML injection Inject 200 mg into the muscle every 14 (fourteen) days.  09/10/16   [provider]  traMADol (ULTRAM) 50 MG tablet Take 1 tablet (50 mg total) by mouth every 6 (six) hours as needed. 02/19/19   Angelia Mould, MD  VELPHORO 500 MG chewable tablet Chew 500-1,000 mg by mouth See admin instructions. Chew 2 (1000 mg) by mouth three times a day with meals and 1 tablet (500 mg) twice daily with snacks 11/15/16   [provider]    Allergies    Iodinated diagnostic agents, Prednisone, Adhesive [tape], Amoxicillin, Clindamycin/lincomycin, Enalapril, Mobic [meloxicam], Morphine, Brilinta [ticagrelor], Ciprofloxacin, Clopidogrel, Codeine, Doxycycline, Hydrocodone, Levofloxacin, Oxycodone, and Rocephin [ceftriaxone]  Review of Systems   Review of Systems  All other systems reviewed and are negative.   Physical Exam Updated Vital Signs BP (!) 149/70 (BP Location: Left Arm)   Pulse 98   Temp 97.7 F (36.5 C) (Oral)   Resp 18   SpO2 99%   Physical Exam Vitals and nursing note reviewed.  Constitutional:      Appearance: Normal appearance. He is obese.  HENT:     Head: Normocephalic.     Right Ear: External ear normal.     Left Ear: External ear normal.     Nose: Nose normal.     Mouth/Throat:     Mouth: Mucous membranes are moist.  Eyes:     Pupils: Pupils are equal, round, and reactive to light.  Neck:     Comments: Tenderness palpation right posterior lateral neck without obvious mass, redness, or  erythema. Trachea is midline Patient is able to range his neck through full normal range of motion. Cardiovascular:     Rate and Rhythm: Normal rate.  Pulmonary:     Effort: Pulmonary effort is normal.  Abdominal:     Palpations: Abdomen is soft.  Musculoskeletal:        General: Normal range of motion.     Cervical back: Normal range of motion.     Comments: Left BKA Full dialysis graft right and left upper extremities Dialysis catheter right chest  Skin:    General: Skin is warm and dry.     Capillary Refill: Capillary refill takes less than 2 seconds.  Neurological:     Mental Status: He is alert.     Cranial Nerves: No cranial nerve deficit.  Sensory: No sensory deficit.     Motor: No weakness.     Coordination: Coordination normal.     Deep Tendon Reflexes: Reflexes normal.  Psychiatric:        Mood and Affect: Mood normal.        Behavior: Behavior normal.     ED Results / Procedures / Treatments   Labs (all labs ordered are listed, but only abnormal results are displayed) Labs Reviewed - No data to display  EKG None  Radiology CT Soft Tissue Neck Wo Contrast  Result Date: 03/20/2020 CLINICAL DATA:  Posterior neck pain beginning several weeks ago. EXAM: CT NECK WITHOUT CONTRAST TECHNIQUE: Multidetector CT imaging of the neck was performed following the standard protocol without intravenous contrast. COMPARISON:  12/27/2015 FINDINGS: Pharynx and larynx: No evidence of mass or swelling. Patent airway. No fluid collection or inflammatory changes in the parapharyngeal or retropharyngeal spaces. Salivary glands: No inflammation, mass, or stone. Thyroid: Unremarkable. Lymph nodes: No enlarged or suspicious lymph nodes in the neck. Vascular: Partially visualized right internal jugular central venous catheter. Minimal calcified atherosclerosis at the carotid bifurcations. Intracranial ICA and vertebral artery calcification. Limited intracranial: Unremarkable. Visualized  orbits: Bilateral cataract extraction. Left scleral buckle. Mastoids and visualized paranasal sinuses: Small mucous retention cysts in the right sphenoid and left maxillary sinuses. Large bilateral mastoid effusions. Skeleton: No acute osseous abnormality or suspicious osseous lesion. The cervical spine evaluated in detail separately. Upper chest: No apical lung consolidation or mass. Other: None. IMPRESSION: 1. No acute abnormality identified in the neck. 2. Large bilateral mastoid effusions. Electronically Signed   By: Logan Bores M.D.   On: 03/20/2020 15:55   CT C-SPINE NO CHARGE  Result Date: 03/20/2020 CLINICAL DATA:  Posterior neck pain beginning several weeks ago. EXAM: CT CERVICAL SPINE WITHOUT CONTRAST TECHNIQUE: Multidetector CT imaging of the cervical spine was performed without intravenous contrast. Multiplanar CT image reconstructions were also generated. COMPARISON:  Neck CT 12/27/2015 FINDINGS: Alignment: Straightening of the normal cervical lordosis. No listhesis. Skull base and vertebrae: No acute fracture or suspicious osseous lesion. Diffusely increased density of the included osseous structures likely reflecting renal osteodystrophy. Soft tissues and spinal canal: No prevertebral fluid or swelling. No visible canal hematoma. Disc levels: Preserved disc space heights throughout the cervical spine. Moderate facet arthrosis at C2-3. No osseous spinal canal or neural foraminal stenosis. Upper chest: Clear lung apices. Other: Soft tissues of the neck reported separately. IMPRESSION: 1. No evidence of acute osseous abnormality in the cervical spine. 2. Moderate facet arthrosis at C2-3. Electronically Signed   By: Logan Bores M.D.   On: 03/20/2020 15:59    Procedures Procedures (including critical care time)  Medications Ordered in ED Medications - No data to display  ED Course  I have reviewed the triage vital signs and the nursing notes.  Pertinent labs & imaging results that were  available during my care of the patient were reviewed by me and considered in my medical decision making (see chart for details).    MDM Rules/Calculators/A&P                          54 yo male morbidly obese, t2dm, on dialysis presents with neck pain.  CT significant for bilateral mastoid effusions.  Patient on empiric antibiotics from urgent care visit and taking Keflex.  He does not continue to appear acutely infected.  He has multiple allergies including those medications which would cover for Pseudomonas.  Patient has followed with ENT at [facility].  Patient advised regarding return precautions and need for follow-up with ENT this week.  Final Clinical Impression(s) / ED Diagnoses Final diagnoses:  Mastoiditis of both sides  Neck pain    Rx / DC Orders ED Discharge Orders    None       Pattricia Boss, MD 03/20/20 1637

## 2020-03-20 NOTE — ED Triage Notes (Signed)
C/o neck pain x3 weeks. Seen at [facility] for same.

## 2020-03-20 NOTE — Discharge Instructions (Addendum)
Please call your ENT as soon as possible for recheck next week Continue antibiotics as prescribed Return if you begin having neck stiffness, running a high fever, difficulty swallowing or breathing.

## 2020-03-20 NOTE — ED Notes (Addendum)
C/o right posterior neck pain onset several weeks ago, denies injury  States he woke up and it was hurting. States the pain is getting worse. Hurts to touch. States he went to [facility] several days ago and was told to come to ED for poss. CT Scan. Patient is a M-W-F dialysis patient , completed dialysis today.

## 2020-03-20 NOTE — ED Notes (Signed)
Discharge instructions discussed with pt. Pt verbalized understanding. Pt stable and ambulatory. No signature pad available. 

## 2020-03-20 NOTE — ED Notes (Signed)
Pt placed on RA. Still sat 97%

## 2020-03-20 NOTE — ED Notes (Signed)
Requesting O2 checked sats , 88% placed on 2 liters/[place] states he uses O2 at night when he sleeps, states there was a malfunction in the scales at dialysis today and feels he didn't get all his weight off.

## 2020-03-20 NOTE — ED Notes (Signed)
Wife at bedside.

## 2020-03-23 DIAGNOSIS — D689 Coagulation defect, unspecified: Secondary | ICD-10-CM | POA: Diagnosis not present

## 2020-03-23 DIAGNOSIS — D509 Iron deficiency anemia, unspecified: Secondary | ICD-10-CM | POA: Diagnosis not present

## 2020-03-23 DIAGNOSIS — E162 Hypoglycemia, unspecified: Secondary | ICD-10-CM | POA: Diagnosis not present

## 2020-03-23 DIAGNOSIS — N186 End stage renal disease: Secondary | ICD-10-CM | POA: Diagnosis not present

## 2020-03-23 DIAGNOSIS — N2581 Secondary hyperparathyroidism of renal origin: Secondary | ICD-10-CM | POA: Diagnosis not present

## 2020-03-23 DIAGNOSIS — Z992 Dependence on renal dialysis: Secondary | ICD-10-CM | POA: Diagnosis not present

## 2020-03-23 DIAGNOSIS — T82818A Embolism of vascular prosthetic devices, implants and grafts, initial encounter: Secondary | ICD-10-CM | POA: Diagnosis not present

## 2020-03-23 DIAGNOSIS — D631 Anemia in chronic kidney disease: Secondary | ICD-10-CM | POA: Diagnosis not present

## 2020-03-24 DIAGNOSIS — Z992 Dependence on renal dialysis: Secondary | ICD-10-CM | POA: Diagnosis not present

## 2020-03-24 DIAGNOSIS — T8612 Kidney transplant failure: Secondary | ICD-10-CM | POA: Diagnosis not present

## 2020-03-24 DIAGNOSIS — N186 End stage renal disease: Secondary | ICD-10-CM | POA: Diagnosis not present

## 2020-03-25 DIAGNOSIS — D689 Coagulation defect, unspecified: Secondary | ICD-10-CM | POA: Diagnosis not present

## 2020-03-25 DIAGNOSIS — H9203 Otalgia, bilateral: Secondary | ICD-10-CM | POA: Diagnosis not present

## 2020-03-25 DIAGNOSIS — N2581 Secondary hyperparathyroidism of renal origin: Secondary | ICD-10-CM | POA: Diagnosis not present

## 2020-03-25 DIAGNOSIS — T82818A Embolism of vascular prosthetic devices, implants and grafts, initial encounter: Secondary | ICD-10-CM | POA: Diagnosis not present

## 2020-03-25 DIAGNOSIS — H6523 Chronic serous otitis media, bilateral: Secondary | ICD-10-CM | POA: Diagnosis not present

## 2020-03-25 DIAGNOSIS — Z992 Dependence on renal dialysis: Secondary | ICD-10-CM | POA: Diagnosis not present

## 2020-03-25 DIAGNOSIS — D509 Iron deficiency anemia, unspecified: Secondary | ICD-10-CM | POA: Diagnosis not present

## 2020-03-25 DIAGNOSIS — D631 Anemia in chronic kidney disease: Secondary | ICD-10-CM | POA: Diagnosis not present

## 2020-03-25 DIAGNOSIS — N186 End stage renal disease: Secondary | ICD-10-CM | POA: Diagnosis not present

## 2020-03-27 DIAGNOSIS — N2581 Secondary hyperparathyroidism of renal origin: Secondary | ICD-10-CM | POA: Diagnosis not present

## 2020-03-27 DIAGNOSIS — D509 Iron deficiency anemia, unspecified: Secondary | ICD-10-CM | POA: Diagnosis not present

## 2020-03-27 DIAGNOSIS — D631 Anemia in chronic kidney disease: Secondary | ICD-10-CM | POA: Diagnosis not present

## 2020-03-27 DIAGNOSIS — T82818A Embolism of vascular prosthetic devices, implants and grafts, initial encounter: Secondary | ICD-10-CM | POA: Diagnosis not present

## 2020-03-27 DIAGNOSIS — Z992 Dependence on renal dialysis: Secondary | ICD-10-CM | POA: Diagnosis not present

## 2020-03-27 DIAGNOSIS — D689 Coagulation defect, unspecified: Secondary | ICD-10-CM | POA: Diagnosis not present

## 2020-03-27 DIAGNOSIS — N186 End stage renal disease: Secondary | ICD-10-CM | POA: Diagnosis not present

## 2020-03-30 DIAGNOSIS — T82818A Embolism of vascular prosthetic devices, implants and grafts, initial encounter: Secondary | ICD-10-CM | POA: Diagnosis not present

## 2020-03-30 DIAGNOSIS — D689 Coagulation defect, unspecified: Secondary | ICD-10-CM | POA: Diagnosis not present

## 2020-03-30 DIAGNOSIS — N186 End stage renal disease: Secondary | ICD-10-CM | POA: Diagnosis not present

## 2020-03-30 DIAGNOSIS — D509 Iron deficiency anemia, unspecified: Secondary | ICD-10-CM | POA: Diagnosis not present

## 2020-03-30 DIAGNOSIS — D631 Anemia in chronic kidney disease: Secondary | ICD-10-CM | POA: Diagnosis not present

## 2020-03-30 DIAGNOSIS — N2581 Secondary hyperparathyroidism of renal origin: Secondary | ICD-10-CM | POA: Diagnosis not present

## 2020-03-30 DIAGNOSIS — Z992 Dependence on renal dialysis: Secondary | ICD-10-CM | POA: Diagnosis not present

## 2020-04-01 DIAGNOSIS — N2581 Secondary hyperparathyroidism of renal origin: Secondary | ICD-10-CM | POA: Diagnosis not present

## 2020-04-01 DIAGNOSIS — D631 Anemia in chronic kidney disease: Secondary | ICD-10-CM | POA: Diagnosis not present

## 2020-04-01 DIAGNOSIS — D689 Coagulation defect, unspecified: Secondary | ICD-10-CM | POA: Diagnosis not present

## 2020-04-01 DIAGNOSIS — D509 Iron deficiency anemia, unspecified: Secondary | ICD-10-CM | POA: Diagnosis not present

## 2020-04-01 DIAGNOSIS — N186 End stage renal disease: Secondary | ICD-10-CM | POA: Diagnosis not present

## 2020-04-01 DIAGNOSIS — T82818A Embolism of vascular prosthetic devices, implants and grafts, initial encounter: Secondary | ICD-10-CM | POA: Diagnosis not present

## 2020-04-01 DIAGNOSIS — Z992 Dependence on renal dialysis: Secondary | ICD-10-CM | POA: Diagnosis not present

## 2020-04-03 DIAGNOSIS — D631 Anemia in chronic kidney disease: Secondary | ICD-10-CM | POA: Diagnosis not present

## 2020-04-03 DIAGNOSIS — Z992 Dependence on renal dialysis: Secondary | ICD-10-CM | POA: Diagnosis not present

## 2020-04-03 DIAGNOSIS — T82818A Embolism of vascular prosthetic devices, implants and grafts, initial encounter: Secondary | ICD-10-CM | POA: Diagnosis not present

## 2020-04-03 DIAGNOSIS — N186 End stage renal disease: Secondary | ICD-10-CM | POA: Diagnosis not present

## 2020-04-03 DIAGNOSIS — D509 Iron deficiency anemia, unspecified: Secondary | ICD-10-CM | POA: Diagnosis not present

## 2020-04-03 DIAGNOSIS — D689 Coagulation defect, unspecified: Secondary | ICD-10-CM | POA: Diagnosis not present

## 2020-04-03 DIAGNOSIS — N2581 Secondary hyperparathyroidism of renal origin: Secondary | ICD-10-CM | POA: Diagnosis not present

## 2020-04-06 DIAGNOSIS — Z20822 Contact with and (suspected) exposure to covid-19: Secondary | ICD-10-CM | POA: Diagnosis not present

## 2020-04-06 DIAGNOSIS — Z833 Family history of diabetes mellitus: Secondary | ICD-10-CM | POA: Diagnosis not present

## 2020-04-06 DIAGNOSIS — D689 Coagulation defect, unspecified: Secondary | ICD-10-CM | POA: Diagnosis not present

## 2020-04-06 DIAGNOSIS — E039 Hypothyroidism, unspecified: Secondary | ICD-10-CM | POA: Diagnosis not present

## 2020-04-06 DIAGNOSIS — E1022 Type 1 diabetes mellitus with diabetic chronic kidney disease: Secondary | ICD-10-CM | POA: Diagnosis not present

## 2020-04-06 DIAGNOSIS — Z8249 Family history of ischemic heart disease and other diseases of the circulatory system: Secondary | ICD-10-CM | POA: Diagnosis not present

## 2020-04-06 DIAGNOSIS — D509 Iron deficiency anemia, unspecified: Secondary | ICD-10-CM | POA: Diagnosis not present

## 2020-04-06 DIAGNOSIS — J189 Pneumonia, unspecified organism: Secondary | ICD-10-CM | POA: Diagnosis not present

## 2020-04-06 DIAGNOSIS — N186 End stage renal disease: Secondary | ICD-10-CM | POA: Diagnosis not present

## 2020-04-06 DIAGNOSIS — Z992 Dependence on renal dialysis: Secondary | ICD-10-CM | POA: Diagnosis not present

## 2020-04-06 DIAGNOSIS — J156 Pneumonia due to other aerobic Gram-negative bacteria: Secondary | ICD-10-CM | POA: Diagnosis not present

## 2020-04-06 DIAGNOSIS — Z818 Family history of other mental and behavioral disorders: Secondary | ICD-10-CM | POA: Diagnosis not present

## 2020-04-06 DIAGNOSIS — R0902 Hypoxemia: Secondary | ICD-10-CM | POA: Diagnosis not present

## 2020-04-06 DIAGNOSIS — D631 Anemia in chronic kidney disease: Secondary | ICD-10-CM | POA: Diagnosis not present

## 2020-04-06 DIAGNOSIS — I517 Cardiomegaly: Secondary | ICD-10-CM | POA: Diagnosis not present

## 2020-04-06 DIAGNOSIS — J159 Unspecified bacterial pneumonia: Secondary | ICD-10-CM | POA: Diagnosis not present

## 2020-04-06 DIAGNOSIS — G8929 Other chronic pain: Secondary | ICD-10-CM | POA: Diagnosis not present

## 2020-04-06 DIAGNOSIS — T82818A Embolism of vascular prosthetic devices, implants and grafts, initial encounter: Secondary | ICD-10-CM | POA: Diagnosis not present

## 2020-04-06 DIAGNOSIS — I1311 Hypertensive heart and chronic kidney disease without heart failure, with stage 5 chronic kidney disease, or end stage renal disease: Secondary | ICD-10-CM | POA: Diagnosis not present

## 2020-04-06 DIAGNOSIS — J9601 Acute respiratory failure with hypoxia: Secondary | ICD-10-CM | POA: Diagnosis not present

## 2020-04-06 DIAGNOSIS — R062 Wheezing: Secondary | ICD-10-CM | POA: Diagnosis not present

## 2020-04-06 DIAGNOSIS — R0602 Shortness of breath: Secondary | ICD-10-CM | POA: Diagnosis not present

## 2020-04-06 DIAGNOSIS — Z9641 Presence of insulin pump (external) (internal): Secondary | ICD-10-CM | POA: Diagnosis not present

## 2020-04-06 DIAGNOSIS — Z809 Family history of malignant neoplasm, unspecified: Secondary | ICD-10-CM | POA: Diagnosis not present

## 2020-04-06 DIAGNOSIS — J188 Other pneumonia, unspecified organism: Secondary | ICD-10-CM | POA: Diagnosis not present

## 2020-04-06 DIAGNOSIS — J811 Chronic pulmonary edema: Secondary | ICD-10-CM | POA: Diagnosis not present

## 2020-04-06 DIAGNOSIS — Z825 Family history of asthma and other chronic lower respiratory diseases: Secondary | ICD-10-CM | POA: Diagnosis not present

## 2020-04-06 DIAGNOSIS — N2581 Secondary hyperparathyroidism of renal origin: Secondary | ICD-10-CM | POA: Diagnosis not present

## 2020-04-06 DIAGNOSIS — Z89512 Acquired absence of left leg below knee: Secondary | ICD-10-CM | POA: Diagnosis not present

## 2020-04-06 DIAGNOSIS — E1122 Type 2 diabetes mellitus with diabetic chronic kidney disease: Secondary | ICD-10-CM | POA: Diagnosis not present

## 2020-04-13 DIAGNOSIS — N2581 Secondary hyperparathyroidism of renal origin: Secondary | ICD-10-CM | POA: Diagnosis not present

## 2020-04-13 DIAGNOSIS — D631 Anemia in chronic kidney disease: Secondary | ICD-10-CM | POA: Diagnosis not present

## 2020-04-13 DIAGNOSIS — Z992 Dependence on renal dialysis: Secondary | ICD-10-CM | POA: Diagnosis not present

## 2020-04-13 DIAGNOSIS — D509 Iron deficiency anemia, unspecified: Secondary | ICD-10-CM | POA: Diagnosis not present

## 2020-04-13 DIAGNOSIS — D689 Coagulation defect, unspecified: Secondary | ICD-10-CM | POA: Diagnosis not present

## 2020-04-13 DIAGNOSIS — N186 End stage renal disease: Secondary | ICD-10-CM | POA: Diagnosis not present

## 2020-04-13 DIAGNOSIS — T82818A Embolism of vascular prosthetic devices, implants and grafts, initial encounter: Secondary | ICD-10-CM | POA: Diagnosis not present

## 2020-04-15 DIAGNOSIS — D631 Anemia in chronic kidney disease: Secondary | ICD-10-CM | POA: Diagnosis not present

## 2020-04-15 DIAGNOSIS — T82818A Embolism of vascular prosthetic devices, implants and grafts, initial encounter: Secondary | ICD-10-CM | POA: Diagnosis not present

## 2020-04-15 DIAGNOSIS — D509 Iron deficiency anemia, unspecified: Secondary | ICD-10-CM | POA: Diagnosis not present

## 2020-04-15 DIAGNOSIS — N186 End stage renal disease: Secondary | ICD-10-CM | POA: Diagnosis not present

## 2020-04-15 DIAGNOSIS — Z992 Dependence on renal dialysis: Secondary | ICD-10-CM | POA: Diagnosis not present

## 2020-04-15 DIAGNOSIS — N2581 Secondary hyperparathyroidism of renal origin: Secondary | ICD-10-CM | POA: Diagnosis not present

## 2020-04-15 DIAGNOSIS — D689 Coagulation defect, unspecified: Secondary | ICD-10-CM | POA: Diagnosis not present

## 2020-04-16 ENCOUNTER — Other Ambulatory Visit: Payer: Self-pay

## 2020-04-16 ENCOUNTER — Ambulatory Visit (INDEPENDENT_AMBULATORY_CARE_PROVIDER_SITE_OTHER): Payer: Medicare Other

## 2020-04-16 ENCOUNTER — Ambulatory Visit (INDEPENDENT_AMBULATORY_CARE_PROVIDER_SITE_OTHER): Payer: Medicare Other | Admitting: Primary Care

## 2020-04-16 ENCOUNTER — Encounter: Payer: Self-pay | Admitting: Primary Care

## 2020-04-16 VITALS — BP 130/70 | HR 85 | Ht 67.0 in | Wt 288.0 lb

## 2020-04-16 DIAGNOSIS — J189 Pneumonia, unspecified organism: Secondary | ICD-10-CM | POA: Diagnosis not present

## 2020-04-16 DIAGNOSIS — J9611 Chronic respiratory failure with hypoxia: Secondary | ICD-10-CM

## 2020-04-16 DIAGNOSIS — G4733 Obstructive sleep apnea (adult) (pediatric): Secondary | ICD-10-CM

## 2020-04-16 DIAGNOSIS — J811 Chronic pulmonary edema: Secondary | ICD-10-CM | POA: Diagnosis not present

## 2020-04-16 NOTE — Progress Notes (Signed)
_0  ID: [name], male    DOB: 04-27-1965, 54 y.o.   MRN: [id number]  Chief Complaint  Patient presents with  . Follow-up    Pt is here today to see if he can qualify for a POC. Pt states he has been doing okay since last visit until he got pneumonia. Pt was admitted at [facility] 12/13 and was discharged 12/18. Pt states that he was supposed to have been put on CPAP but never did. Pt states that he does have O2 at home after buying a concentrator but is trying to get a POC from [facility] but they need to have a prescription sent to [facility] in order to receive this. Pt states that he wears 2L O2.    Referring provider: Donney Dice, DO  HPI: 54 year old male, never smoked. PMH significant for end-stage renal disease on HD, insulin-dependent diabetes on insulin pump, CAD, PVD s/p lower extremity amputation, diastolic dysfunction, arthralgia, obesity. Patient of Dr. Melvyn Novas, last seen on 08/10/17. Follows with [facility] rheumatology joint pain, mulitple sites.  04/16/2020 Patient presents today for overdue follow-up/qualify for POC. He has not been seen in our office in over two years. He was recently admitted to [facility] from 04/06/20 until 04/11/20 for multifocal pneumonia . He was treated with IV Vancomycin, cefepime and azithromycin for 4 days. He was discharged home on Omnicef 314m twice daily x 7 days and oral azithromycin daily x 5 days. BC negative. Patient states that his breathing is better and that he is not "hacking" as much. He does report GERD symptoms, recently started taking omeprazole. He [place] flutter valve [place]which will be delivered soon. He is not currently taking an expectorant. He wears 2L oxygen continuously. He is interested in purchasing POC from [facility]   Allergies  Allergen Reactions  . Iodinated Diagnostic Agents Itching, Rash and Other (See Comments)    Systemic itching and transient rash appearance within hours of receiving contrast    .  Prednisone Other (See Comments)    Increases sugar levels  . Adhesive [Tape] Itching and Other (See Comments)    Blisters on arm from adhesive tape at dialysis   . Amoxicillin Itching, Rash and Other (See Comments)    Did it involve swelling of the face/tongue/throat, SOB, or low BP? No Did it involve sudden or severe rash/hives, skin peeling, or any reaction on the inside of your mouth or nose? No Did you need to seek medical attention at a hospital or doctor's office? No When did it last happen?10+ years If all above answers are "NO", may proceed with cephalosporin use.   . Clindamycin/Lincomycin Hives and Itching  . Enalapril Rash and Cough    Cough  . Mobic [Meloxicam] Hives and Rash  . Morphine Hives, Nausea Only and Rash  . Brilinta [Ticagrelor] Nausea And Vomiting  . Ciprofloxacin Itching and Rash  . Clopidogrel Rash  . Codeine Hives, Itching and Rash  . Doxycycline Rash  . Hydrocodone Rash  . Levofloxacin Hives and Itching  . Oxycodone Rash  . Rocephin [Ceftriaxone] Itching    Immunization History  Administered Date(s) Administered  . Influenza Split 02/03/2014, 01/21/2016, 01/23/2018, 01/23/2019, 01/28/2019, 01/22/2020  . Influenza,inj,Quad PF,6+ Mos 01/21/2016  . Influenza-Unspecified 02/03/2014, 01/24/2017, 01/28/2019  . Moderna Sars-Covid-2 Vaccination 06/07/2019, 07/05/2019  . Pneumococcal Conjugate-13 03/01/2018  . Pneumococcal Polysaccharide-23 04/14/2016  . Zoster Recombinat (Shingrix) 12/07/2018    Past Medical History:  Diagnosis Date  . Anal fissure   .  Anemia of chronic disease   . Arthritis    "hands, right knee" (03/19/2014) no cartlidge in right knee  . CAD (coronary artery disease)    a. Abnl nuc ->LHC (11/15):  mid to dist Dx 80%, mid RCA 99% (functional CTO), dist RCA 80% >> PCI: balloon angioplasty to mid RCA (could not deliver stent) - staged PCI Rotoblator atherectomy/DES to [facility]. b. NSTEMI 12/2015 s/p DES to diagonal.  . Cholecystitis     a. 08/27/2011  . Chronic diastolic CHF (congestive heart failure) ([place])    a. Echo 3/13:  EF 55-60%;  b. Echo (11/15):  Mild LVH, EF 60-65%, no RWMA, Gr 1 DD, MAC, mild LAE, normal RVF, mild RAE;  c.  Echo 5/16:  severe LVH, EF 55-60%, no RWMA, Gr 1 DD, MAC, mild LAE  . Claustrophobia    when things get around his face.   . Complication of anesthesia   . Difficult intubation    only once in 2015 at [facility] haven't had a problem since then  . Diverticulosis   . Esophageal dysmotility   . ESRD (end stage renal disease) ([place])    a. 1995 s/p cadaveric transplant w/ susbequent failure after 18 yrs;  b.Dialysis initiated 07/2011 [facility] M-W-F  . Fibromyalgia   . Gastroparesis   . GERD (gastroesophageal reflux disease)   . Gout    PMH  . History of blood transfusion   . History of kidney stones    early 1990's  . HLD (hyperlipidemia)   . Hypertension   . Hypothyroidism   . Inguinal lymphadenopathy    a. bilateral - s/p biopsy 07/2011  . Insulin dependent diabetes mellitus    Type I  . Monilial esophagitis ([place])   . Morbid obesity ([place])   . OSA (obstructive sleep apnea)    adjustable bed and uses O2 L at bedtime  . PAD (peripheral artery disease) ([facility])    a. s/p L BKA.  [name] Kitchen Pericardial effusion    a.  Small by CT 08/22/11;  b.  Large by CT 08/27/11. c. Not seen on 11/2015 echo.  . Pneumonia ~ 2007  . PONV (postoperative nausea and vomiting)    vomited once after a procedure.  . T1DM (type 1 diabetes mellitus) ([place])   . Tracheobronchomalacia     Tobacco History: Social History   Tobacco Use  Smoking Status Never Smoker  Smokeless Tobacco Never Used   Counseling given: Not Answered   Outpatient Medications Prior to Visit  Medication Sig Dispense Refill  . acetaminophen (TYLENOL) 500 MG tablet Take 1,000 mg by mouth every 6 (six) hours as needed (pain/headaches.).     [name] Kitchen allopurinol (ZYLOPRIM) 100 MG tablet TAKE 2 TABLETS BY MOUTH DAILY 180 tablet 3  . aspirin EC 81 MG  tablet Take 81 mg by mouth at bedtime.    [name] Kitchen atorvastatin (LIPITOR) 20 MG tablet Take 20 mg by mouth daily.    . B Complex-C-Folic Acid (DIALYVITE 179) 0.8 MG TABS Take 1 tablet by mouth daily.    . calcium acetate (PHOSLO) 667 MG capsule Take 667-1,334 mg by mouth See admin instructions. Take 2 capsules (1334 mg) with each meal and 1 capsule (667 mg) with each snack    . cetirizine (ZYRTEC) 10 MG tablet Take 10 mg by mouth daily.    . diphenhydrAMINE (BENADRYL) 25 MG tablet Take 50 mg by mouth at bedtime.    . docusate sodium (COLACE) 100 MG capsule Take 100 mg by mouth  2 (two) times daily.     . famotidine (PEPCID) 20 MG tablet Take 20 mg by mouth daily as needed for heartburn or indigestion.     . fluticasone (FLONASE) 50 MCG/ACT nasal spray Place 2 sprays into both nostrils daily as needed for allergies.     [name] Kitchen gabapentin (NEURONTIN) 300 MG capsule Take 300 mg by mouth at bedtime.    [name] Kitchen GLUCAGON EMERGENCY 1 MG injection Inject 1 mg into the muscle daily as needed (low blood sugar).     . hydrocortisone 2.5 % cream Apply 1 application topically 2 (two) times daily as needed (rash / itching).    [name] Kitchen HYDROmorphone (DILAUDID) 2 MG tablet Take 2 mg by mouth 2 (two) times daily as needed for moderate pain or severe pain.     [name] Kitchen insulin lispro (HUMALOG) 100 UNIT/ML injection Inject 75-120 Units into the skin continuous. Per insulin pump    . levothyroxine (SYNTHROID, LEVOTHROID) 112 MCG tablet Take 112 mcg by mouth at bedtime.     [name] Kitchen loperamide (IMODIUM A-D) 2 MG tablet Take 2-4 mg by mouth 4 (four) times daily as needed for diarrhea or loose stools.    . Melatonin 5 MG TABS Take 5 mg by mouth at bedtime.    . metoCLOPramide (REGLAN) 10 MG tablet Take 10 mg by mouth 4 (four) times daily -  before meals and at bedtime.     . midodrine (PROAMATINE) 10 MG tablet Take 1 tablet (10 mg total) by mouth 3 (three) times daily. Takes 1 tab in mornings of dialysis and takes 2 tabs 30 mins before starting dialysis  (m,w,f) (Patient taking differently: Take 10-20 mg by mouth See admin instructions. Take 2 tablets (20 mg) 1 before dialysis treatment on Mon, Wed, and Fri & 1 tablet (10 mg) by mouth as needed for low blood pressure)    . Nutritional Supplements (FEEDING SUPPLEMENT, NEPRO CARB STEADY,) LIQD Take 237 mLs by mouth daily. (Patient taking differently: Take 237 mLs by mouth 3 (three) times a week.) 30 Can 0  . nystatin (MYCOSTATIN/NYSTOP) powder Apply 1 g topically daily as needed (yeast).    Vladimir Faster Glycol-Propyl Glycol (SYSTANE ULTRA PF OP) Place 1 drop into both eyes every 2 (two) hours as needed (dry eyes).    Vladimir Faster Glycol-Propyl Glycol 0.4-0.3 % SOLN Place 1 drop into both eyes 4 (four) times daily.    . polyethylene glycol (MIRALAX / GLYCOLAX) packet Take 17 g by mouth daily. (Patient taking differently: Take 17 g by mouth daily as needed for mild constipation.) 14 each 0  . prasugrel (EFFIENT) 10 MG TABS tablet Take 1 tablet (10 mg total) by mouth daily. Please keep upcoming appt in January 2022 before anymore refills. Thank you 90 tablet 0  . promethazine (PHENERGAN) 25 MG tablet Take 25 mg by mouth every 6 (six) hours as needed for nausea or vomiting.    [name] Kitchen rOPINIRole (REQUIP) 2 MG tablet Take 2 mg by mouth at bedtime.    [name] Kitchen testosterone cypionate (DEPOTESTOSTERONE CYPIONATE) 200 MG/ML injection Inject 200 mg into the muscle every 14 (fourteen) days.   1  . Glucose-Cholecalciferol (RELION GLUCOSE SHOT) LIQD Take 1 Dose by mouth daily as needed (low blood sugar). (Patient not taking: Reported on 04/16/2020)    . nitroGLYCERIN (NITROSTAT) 0.4 MG SL tablet Place 1 tablet (0.4 mg total) under the tongue every 5 (five) minutes as needed for chest pain. (Patient not taking: Reported on 04/16/2020) 25 tablet 4  . rOPINIRole (  REQUIP) 2 MG tablet Take 1 tablet (2 mg total) by mouth at bedtime. Take 2 mg by mouth every night at bedtime (Patient taking differently: Take 2 mg by mouth daily with supper.  ) 30 tablet 0  . traMADol (ULTRAM) 50 MG tablet Take 1 tablet (50 mg total) by mouth every 6 (six) hours as needed. 20 tablet 0  . VELPHORO 500 MG chewable tablet Chew 500-1,000 mg by mouth See admin instructions. Chew 2 (1000 mg) by mouth three times a day with meals and 1 tablet (500 mg) twice daily with snacks  10   No facility-administered medications prior to visit.    Review of Systems  Review of Systems  Constitutional: Negative.   Respiratory: Positive for cough and shortness of breath. Negative for chest tightness and wheezing.    Physical Exam  BP 130/70 (BP Location: Left Wrist, Cuff Size: Normal)   Pulse 85   Ht _0  (1.702 m)   Wt 288 lb (130.6 kg)   SpO2 97%   BMI 45.11 kg/m  Physical Exam Constitutional:      General: He is not in acute distress.    Appearance: Normal appearance. He is obese. He is not ill-appearing.  HENT:     Head: Normocephalic and atraumatic.     Mouth/Throat:     Mouth: Mucous membranes are moist.     Pharynx: Oropharynx is clear.  Cardiovascular:     Rate and Rhythm: Normal rate and regular rhythm.  Pulmonary:     Effort: Pulmonary effort is normal.     Breath sounds: Rhonchi present. No wheezing.  Musculoskeletal:     Comments: LBKA  Skin:    General: Skin is warm and dry.  Neurological:     Mental Status: He is alert.  Psychiatric:        Mood and Affect: Mood normal.        Behavior: Behavior normal.        Thought Content: Thought content normal.        Judgment: Judgment normal.      Lab Results:  CBC    Component Value Date/Time   WBC 10.1 12/28/2018 0249   RBC 3.21 (L) 12/28/2018 0249   HGB 14.6 02/19/2019 0903   HGB 12.6 (L) 11/29/2016 1454   HCT 43.0 02/19/2019 0903   HCT 30.7 (L) 07/13/2017 0455   PLT 226 12/28/2018 0249   PLT 278 11/29/2016 1454   MCV 106.9 (H) 12/28/2018 0249   MCV 108 (H) 11/29/2016 1454   MCH 33.6 12/28/2018 0249   MCHC 31.5 12/28/2018 0249   RDW 14.5 12/28/2018 0249   RDW 15.2  11/29/2016 1454   LYMPHSABS 1.5 08/01/2018 2203   LYMPHSABS 1.6 11/29/2016 1454   MONOABS 0.6 08/01/2018 2203   EOSABS 0.3 08/01/2018 2203   EOSABS 0.3 11/29/2016 1454   BASOSABS 0.1 08/01/2018 2203   BASOSABS 0.0 11/29/2016 1454    BMET    Component Value Date/Time   NA 137 02/19/2019 0903   NA 140 11/29/2016 1454   K 4.8 02/19/2019 0903   CL 99 02/19/2019 0903   CO2 25 12/28/2018 0249   GLUCOSE 143 (H) 02/19/2019 0903   BUN 39 (H) 02/19/2019 0903   BUN 38 (H) 11/29/2016 1454   CREATININE 8.60 (H) 02/19/2019 0903   CALCIUM 9.2 12/28/2018 0249   GFRNONAA 5 (L) 12/28/2018 0249   GFRAA 6 (L) 12/28/2018 0249    BNP    Component Value Date/Time   BNP  1,185.6 (H) 02/02/2016 0930    ProBNP No results found for: PROBNP  Imaging: DG Chest 2 View  Result Date: 04/16/2020 CLINICAL DATA:  Multifocal pneumonia EXAM: CHEST - 2 VIEW COMPARISON:  August 02, 2018 FINDINGS: The heart size and mediastinal contours are within normal limits. A right-sided central venous catheter seen with the tip just entering the right atrium. There is prominence of the central pulmonary vasculature. Patchy airspace opacities are seen at both lung bases, right greater than. No acute osseous abnormality. IMPRESSION: Patchy airspace opacities at both lung bases, right greater than left, likely due to multifocal pneumonia. Mild pulmonary vascular congestion. Electronically Signed   By: Prudencio Pair M.D.   On: 04/16/2020 10:18     Assessment & Plan:   Chronic respiratory failure with hypoxia ([place]) - He has been on O2 since 2019, he wears 2L oxygen. He previously purchased his own concentrator. He is interested in getting [employer] POC today, order faxed to DME company   OSA (obstructive sleep apnea) - Previously on BIPAP 23/19. Referred for split night sleedy study again in April 2019, this does not appear to have been completed. Needs to be re-address on follow-up  Multifocal pneumonia - Admitted for  multifocal pneumonia from 04/06/20-04/11/20 - CXR today showed patchy airspace opacities at both lung bases, right greater than left, likely due to known multifocal pneumonia. Mild pulmonary vascular congestion. - Advised patient to complete antibiotics as prescribed, start taking mucinex 600-1282m twice daily and use flutter valve 3-4 times a day- Needs repeat CXR in 4 weeks prior to FU with Dr. WAlethia Berthold NP 05/05/2020

## 2020-04-16 NOTE — Progress Notes (Signed)
Called and went over xray results per E. Volanda [name] NP with patient. All questions answered and patient expressed full understanding of results and to continue ABX and to start mucinex/flutter valve. Patient stated he already has a flutter valve and will be getting the mucinex. Chest xray order placed per NP order to be done prior to Dr Melvyn Novas visit in 4 weeks. Patient aware and agreeable to this. Nothing further needed at this time.

## 2020-04-16 NOTE — Progress Notes (Signed)
Please let patient know CXR showed patchy pneumonia both lung bases, right > left. This was known. Continue abx and start mucinex/flutter valve. Will need repeat CXR in 4 weeks prior to DR. Wert visit. Please order

## 2020-04-16 NOTE — Patient Instructions (Addendum)
Complete antibiotics as prescribed Start taking mucinex 600-1200mg  twice daily with glass of water Use flutter valve 3-4 times a day We will fax order for [employer] concentrator today  CXR today  Follow-up: 2-4 weeks with Dr. Melvyn Novas

## 2020-04-16 NOTE — Progress Notes (Signed)
xray

## 2020-04-17 DIAGNOSIS — T82818A Embolism of vascular prosthetic devices, implants and grafts, initial encounter: Secondary | ICD-10-CM | POA: Diagnosis not present

## 2020-04-17 DIAGNOSIS — D689 Coagulation defect, unspecified: Secondary | ICD-10-CM | POA: Diagnosis not present

## 2020-04-17 DIAGNOSIS — N2581 Secondary hyperparathyroidism of renal origin: Secondary | ICD-10-CM | POA: Diagnosis not present

## 2020-04-17 DIAGNOSIS — D509 Iron deficiency anemia, unspecified: Secondary | ICD-10-CM | POA: Diagnosis not present

## 2020-04-17 DIAGNOSIS — D631 Anemia in chronic kidney disease: Secondary | ICD-10-CM | POA: Diagnosis not present

## 2020-04-17 DIAGNOSIS — Z992 Dependence on renal dialysis: Secondary | ICD-10-CM | POA: Diagnosis not present

## 2020-04-17 DIAGNOSIS — N186 End stage renal disease: Secondary | ICD-10-CM | POA: Diagnosis not present

## 2020-04-20 DIAGNOSIS — N186 End stage renal disease: Secondary | ICD-10-CM | POA: Diagnosis not present

## 2020-04-20 DIAGNOSIS — D631 Anemia in chronic kidney disease: Secondary | ICD-10-CM | POA: Diagnosis not present

## 2020-04-20 DIAGNOSIS — T82818A Embolism of vascular prosthetic devices, implants and grafts, initial encounter: Secondary | ICD-10-CM | POA: Diagnosis not present

## 2020-04-20 DIAGNOSIS — D689 Coagulation defect, unspecified: Secondary | ICD-10-CM | POA: Diagnosis not present

## 2020-04-20 DIAGNOSIS — D509 Iron deficiency anemia, unspecified: Secondary | ICD-10-CM | POA: Diagnosis not present

## 2020-04-20 DIAGNOSIS — Z992 Dependence on renal dialysis: Secondary | ICD-10-CM | POA: Diagnosis not present

## 2020-04-20 DIAGNOSIS — N2581 Secondary hyperparathyroidism of renal origin: Secondary | ICD-10-CM | POA: Diagnosis not present

## 2020-04-22 DIAGNOSIS — N186 End stage renal disease: Secondary | ICD-10-CM | POA: Diagnosis not present

## 2020-04-22 DIAGNOSIS — D689 Coagulation defect, unspecified: Secondary | ICD-10-CM | POA: Diagnosis not present

## 2020-04-22 DIAGNOSIS — Z992 Dependence on renal dialysis: Secondary | ICD-10-CM | POA: Diagnosis not present

## 2020-04-22 DIAGNOSIS — N2581 Secondary hyperparathyroidism of renal origin: Secondary | ICD-10-CM | POA: Diagnosis not present

## 2020-04-22 DIAGNOSIS — D509 Iron deficiency anemia, unspecified: Secondary | ICD-10-CM | POA: Diagnosis not present

## 2020-04-22 DIAGNOSIS — D631 Anemia in chronic kidney disease: Secondary | ICD-10-CM | POA: Diagnosis not present

## 2020-04-22 DIAGNOSIS — T82818A Embolism of vascular prosthetic devices, implants and grafts, initial encounter: Secondary | ICD-10-CM | POA: Diagnosis not present

## 2020-04-23 DIAGNOSIS — M1711 Unilateral primary osteoarthritis, right knee: Secondary | ICD-10-CM | POA: Diagnosis not present

## 2020-04-24 DIAGNOSIS — T82818A Embolism of vascular prosthetic devices, implants and grafts, initial encounter: Secondary | ICD-10-CM | POA: Diagnosis not present

## 2020-04-24 DIAGNOSIS — T8612 Kidney transplant failure: Secondary | ICD-10-CM | POA: Diagnosis not present

## 2020-04-24 DIAGNOSIS — D631 Anemia in chronic kidney disease: Secondary | ICD-10-CM | POA: Diagnosis not present

## 2020-04-24 DIAGNOSIS — Z992 Dependence on renal dialysis: Secondary | ICD-10-CM | POA: Diagnosis not present

## 2020-04-24 DIAGNOSIS — N2581 Secondary hyperparathyroidism of renal origin: Secondary | ICD-10-CM | POA: Diagnosis not present

## 2020-04-24 DIAGNOSIS — D509 Iron deficiency anemia, unspecified: Secondary | ICD-10-CM | POA: Diagnosis not present

## 2020-04-24 DIAGNOSIS — N186 End stage renal disease: Secondary | ICD-10-CM | POA: Diagnosis not present

## 2020-04-24 DIAGNOSIS — D689 Coagulation defect, unspecified: Secondary | ICD-10-CM | POA: Diagnosis not present

## 2020-04-25 DIAGNOSIS — Z8616 Personal history of COVID-19: Secondary | ICD-10-CM

## 2020-04-25 HISTORY — DX: Personal history of COVID-19: Z86.16

## 2020-04-27 DIAGNOSIS — N186 End stage renal disease: Secondary | ICD-10-CM | POA: Diagnosis not present

## 2020-04-27 DIAGNOSIS — D689 Coagulation defect, unspecified: Secondary | ICD-10-CM | POA: Diagnosis not present

## 2020-04-27 DIAGNOSIS — N2581 Secondary hyperparathyroidism of renal origin: Secondary | ICD-10-CM | POA: Diagnosis not present

## 2020-04-27 DIAGNOSIS — D631 Anemia in chronic kidney disease: Secondary | ICD-10-CM | POA: Diagnosis not present

## 2020-04-27 DIAGNOSIS — T82818A Embolism of vascular prosthetic devices, implants and grafts, initial encounter: Secondary | ICD-10-CM | POA: Diagnosis not present

## 2020-04-27 DIAGNOSIS — D509 Iron deficiency anemia, unspecified: Secondary | ICD-10-CM | POA: Diagnosis not present

## 2020-04-27 DIAGNOSIS — Z992 Dependence on renal dialysis: Secondary | ICD-10-CM | POA: Diagnosis not present

## 2020-04-29 DIAGNOSIS — D509 Iron deficiency anemia, unspecified: Secondary | ICD-10-CM | POA: Diagnosis not present

## 2020-04-29 DIAGNOSIS — Z992 Dependence on renal dialysis: Secondary | ICD-10-CM | POA: Diagnosis not present

## 2020-04-29 DIAGNOSIS — N186 End stage renal disease: Secondary | ICD-10-CM | POA: Diagnosis not present

## 2020-04-29 DIAGNOSIS — D689 Coagulation defect, unspecified: Secondary | ICD-10-CM | POA: Diagnosis not present

## 2020-04-29 DIAGNOSIS — T82818A Embolism of vascular prosthetic devices, implants and grafts, initial encounter: Secondary | ICD-10-CM | POA: Diagnosis not present

## 2020-04-29 DIAGNOSIS — N2581 Secondary hyperparathyroidism of renal origin: Secondary | ICD-10-CM | POA: Diagnosis not present

## 2020-04-29 DIAGNOSIS — D631 Anemia in chronic kidney disease: Secondary | ICD-10-CM | POA: Diagnosis not present

## 2020-05-01 DIAGNOSIS — T82818A Embolism of vascular prosthetic devices, implants and grafts, initial encounter: Secondary | ICD-10-CM | POA: Diagnosis not present

## 2020-05-01 DIAGNOSIS — D509 Iron deficiency anemia, unspecified: Secondary | ICD-10-CM | POA: Diagnosis not present

## 2020-05-01 DIAGNOSIS — D689 Coagulation defect, unspecified: Secondary | ICD-10-CM | POA: Diagnosis not present

## 2020-05-01 DIAGNOSIS — N2581 Secondary hyperparathyroidism of renal origin: Secondary | ICD-10-CM | POA: Diagnosis not present

## 2020-05-01 DIAGNOSIS — Z992 Dependence on renal dialysis: Secondary | ICD-10-CM | POA: Diagnosis not present

## 2020-05-01 DIAGNOSIS — N186 End stage renal disease: Secondary | ICD-10-CM | POA: Diagnosis not present

## 2020-05-01 DIAGNOSIS — D631 Anemia in chronic kidney disease: Secondary | ICD-10-CM | POA: Diagnosis not present

## 2020-05-04 DIAGNOSIS — Z992 Dependence on renal dialysis: Secondary | ICD-10-CM | POA: Diagnosis not present

## 2020-05-04 DIAGNOSIS — D689 Coagulation defect, unspecified: Secondary | ICD-10-CM | POA: Diagnosis not present

## 2020-05-04 DIAGNOSIS — D509 Iron deficiency anemia, unspecified: Secondary | ICD-10-CM | POA: Diagnosis not present

## 2020-05-04 DIAGNOSIS — N2581 Secondary hyperparathyroidism of renal origin: Secondary | ICD-10-CM | POA: Diagnosis not present

## 2020-05-04 DIAGNOSIS — M2550 Pain in unspecified joint: Secondary | ICD-10-CM | POA: Diagnosis not present

## 2020-05-04 DIAGNOSIS — D631 Anemia in chronic kidney disease: Secondary | ICD-10-CM | POA: Diagnosis not present

## 2020-05-04 DIAGNOSIS — T82818A Embolism of vascular prosthetic devices, implants and grafts, initial encounter: Secondary | ICD-10-CM | POA: Diagnosis not present

## 2020-05-04 DIAGNOSIS — Z79899 Other long term (current) drug therapy: Secondary | ICD-10-CM | POA: Diagnosis not present

## 2020-05-04 DIAGNOSIS — N186 End stage renal disease: Secondary | ICD-10-CM | POA: Diagnosis not present

## 2020-05-05 ENCOUNTER — Encounter: Payer: Self-pay | Admitting: Primary Care

## 2020-05-05 DIAGNOSIS — H35043 Retinal micro-aneurysms, unspecified, bilateral: Secondary | ICD-10-CM | POA: Diagnosis not present

## 2020-05-05 DIAGNOSIS — E103551 Type 1 diabetes mellitus with stable proliferative diabetic retinopathy, right eye: Secondary | ICD-10-CM | POA: Diagnosis not present

## 2020-05-05 DIAGNOSIS — J189 Pneumonia, unspecified organism: Secondary | ICD-10-CM | POA: Insufficient documentation

## 2020-05-05 DIAGNOSIS — E103532 Type 1 diabetes mellitus with proliferative diabetic retinopathy with traction retinal detachment not involving the macula, left eye: Secondary | ICD-10-CM | POA: Diagnosis not present

## 2020-05-05 DIAGNOSIS — Z794 Long term (current) use of insulin: Secondary | ICD-10-CM | POA: Diagnosis not present

## 2020-05-05 NOTE — Assessment & Plan Note (Addendum)
-   Admitted for multifocal pneumonia from 04/06/20-04/11/20 - CXR today showed patchy airspace opacities at both lung bases, right greater than left, likely due to known multifocal pneumonia. Mild pulmonary vascular congestion. - Advised patient to complete antibiotics as prescribed, start taking mucinex 600-1200mg  twice daily and use flutter valve 3-4 times a day- Needs repeat CXR in 4 weeks prior to FU with Dr. Melvyn Novas

## 2020-05-05 NOTE — Assessment & Plan Note (Addendum)
-   Previously on BIPAP 23/19. Referred for split night sleedy study again in April 2019, this does not appear to have been completed. Needs to be re-address on follow-up

## 2020-05-05 NOTE — Assessment & Plan Note (Addendum)
-   He has been on O2 since 2019, he wears 2L oxygen. He previously purchased his own concentrator. He is interested in getting [employer] POC today, order faxed to [place]

## 2020-05-06 DIAGNOSIS — D631 Anemia in chronic kidney disease: Secondary | ICD-10-CM | POA: Diagnosis not present

## 2020-05-06 DIAGNOSIS — T82818A Embolism of vascular prosthetic devices, implants and grafts, initial encounter: Secondary | ICD-10-CM | POA: Diagnosis not present

## 2020-05-06 DIAGNOSIS — D689 Coagulation defect, unspecified: Secondary | ICD-10-CM | POA: Diagnosis not present

## 2020-05-06 DIAGNOSIS — N2581 Secondary hyperparathyroidism of renal origin: Secondary | ICD-10-CM | POA: Diagnosis not present

## 2020-05-06 DIAGNOSIS — N186 End stage renal disease: Secondary | ICD-10-CM | POA: Diagnosis not present

## 2020-05-06 DIAGNOSIS — Z992 Dependence on renal dialysis: Secondary | ICD-10-CM | POA: Diagnosis not present

## 2020-05-06 DIAGNOSIS — D509 Iron deficiency anemia, unspecified: Secondary | ICD-10-CM | POA: Diagnosis not present

## 2020-05-08 ENCOUNTER — Emergency Department (HOSPITAL_COMMUNITY): Payer: Medicare Other

## 2020-05-08 ENCOUNTER — Other Ambulatory Visit: Payer: Self-pay

## 2020-05-08 ENCOUNTER — Encounter (HOSPITAL_COMMUNITY): Payer: Self-pay

## 2020-05-08 ENCOUNTER — Observation Stay (HOSPITAL_COMMUNITY)
Admission: EM | Admit: 2020-05-08 | Discharge: 2020-05-08 | Disposition: A | Discharge status: Home / Self Care | Payer: Medicare Other | Attending: Family Medicine | Admitting: Family Medicine

## 2020-05-08 DIAGNOSIS — R509 Fever, unspecified: Secondary | ICD-10-CM | POA: Diagnosis not present

## 2020-05-08 DIAGNOSIS — Z992 Dependence on renal dialysis: Secondary | ICD-10-CM | POA: Diagnosis not present

## 2020-05-08 DIAGNOSIS — E039 Hypothyroidism, unspecified: Secondary | ICD-10-CM | POA: Insufficient documentation

## 2020-05-08 DIAGNOSIS — I5032 Chronic diastolic (congestive) heart failure: Secondary | ICD-10-CM | POA: Insufficient documentation

## 2020-05-08 DIAGNOSIS — T82818A Embolism of vascular prosthetic devices, implants and grafts, initial encounter: Secondary | ICD-10-CM | POA: Diagnosis not present

## 2020-05-08 DIAGNOSIS — E1022 Type 1 diabetes mellitus with diabetic chronic kidney disease: Secondary | ICD-10-CM | POA: Insufficient documentation

## 2020-05-08 DIAGNOSIS — Z7982 Long term (current) use of aspirin: Secondary | ICD-10-CM | POA: Diagnosis not present

## 2020-05-08 DIAGNOSIS — N186 End stage renal disease: Secondary | ICD-10-CM | POA: Diagnosis not present

## 2020-05-08 DIAGNOSIS — I517 Cardiomegaly: Secondary | ICD-10-CM | POA: Diagnosis not present

## 2020-05-08 DIAGNOSIS — Z794 Long term (current) use of insulin: Secondary | ICD-10-CM | POA: Diagnosis not present

## 2020-05-08 DIAGNOSIS — D509 Iron deficiency anemia, unspecified: Secondary | ICD-10-CM | POA: Diagnosis not present

## 2020-05-08 DIAGNOSIS — J811 Chronic pulmonary edema: Secondary | ICD-10-CM | POA: Diagnosis not present

## 2020-05-08 DIAGNOSIS — R0602 Shortness of breath: Secondary | ICD-10-CM

## 2020-05-08 DIAGNOSIS — D689 Coagulation defect, unspecified: Secondary | ICD-10-CM | POA: Diagnosis not present

## 2020-05-08 DIAGNOSIS — I132 Hypertensive heart and chronic kidney disease with heart failure and with stage 5 chronic kidney disease, or end stage renal disease: Secondary | ICD-10-CM | POA: Insufficient documentation

## 2020-05-08 DIAGNOSIS — I2510 Atherosclerotic heart disease of native coronary artery without angina pectoris: Secondary | ICD-10-CM | POA: Insufficient documentation

## 2020-05-08 DIAGNOSIS — D631 Anemia in chronic kidney disease: Secondary | ICD-10-CM | POA: Diagnosis not present

## 2020-05-08 DIAGNOSIS — Z79899 Other long term (current) drug therapy: Secondary | ICD-10-CM | POA: Insufficient documentation

## 2020-05-08 DIAGNOSIS — R0600 Dyspnea, unspecified: Secondary | ICD-10-CM | POA: Diagnosis present

## 2020-05-08 DIAGNOSIS — Z9861 Coronary angioplasty status: Secondary | ICD-10-CM | POA: Insufficient documentation

## 2020-05-08 DIAGNOSIS — U071 COVID-19: Secondary | ICD-10-CM | POA: Diagnosis not present

## 2020-05-08 DIAGNOSIS — J90 Pleural effusion, not elsewhere classified: Secondary | ICD-10-CM | POA: Diagnosis not present

## 2020-05-08 DIAGNOSIS — N2581 Secondary hyperparathyroidism of renal origin: Secondary | ICD-10-CM | POA: Diagnosis not present

## 2020-05-08 LAB — HEPATIC FUNCTION PANEL
ALT: 14 U/L (ref 0–44)
AST: 20 U/L (ref 15–41)
Albumin: 3.3 g/dL — ABNORMAL LOW (ref 3.5–5.0)
Alkaline Phosphatase: 68 U/L (ref 38–126)
Bilirubin, Direct: 0.2 mg/dL (ref 0.0–0.2)
Indirect Bilirubin: 0.4 mg/dL (ref 0.3–0.9)
Total Bilirubin: 0.6 mg/dL (ref 0.3–1.2)
Total Protein: 8 g/dL (ref 6.5–8.1)

## 2020-05-08 LAB — PROCALCITONIN: Procalcitonin: 2.51 ng/mL

## 2020-05-08 LAB — CBC
HCT: 34.5 % — ABNORMAL LOW (ref 39.0–52.0)
Hemoglobin: 10.8 g/dL — ABNORMAL LOW (ref 13.0–17.0)
MCH: 32.8 pg (ref 26.0–34.0)
MCHC: 31.3 g/dL (ref 30.0–36.0)
MCV: 104.9 fL — ABNORMAL HIGH (ref 80.0–100.0)
Platelets: 200 10*3/uL (ref 150–400)
RBC: 3.29 MIL/uL — ABNORMAL LOW (ref 4.22–5.81)
RDW: 14.6 % (ref 11.5–15.5)
WBC: 13.6 10*3/uL — ABNORMAL HIGH (ref 4.0–10.5)
nRBC: 0 % (ref 0.0–0.2)

## 2020-05-08 LAB — BASIC METABOLIC PANEL
Anion gap: 15 (ref 5–15)
BUN: 13 mg/dL (ref 6–20)
CO2: 29 mmol/L (ref 22–32)
Calcium: 8.4 mg/dL — ABNORMAL LOW (ref 8.9–10.3)
Chloride: 89 mmol/L — ABNORMAL LOW (ref 98–111)
Creatinine, Ser: 4.84 mg/dL — ABNORMAL HIGH (ref 0.61–1.24)
GFR, Estimated: 13 mL/min — ABNORMAL LOW (ref >60)
Glucose, Bld: 224 mg/dL — ABNORMAL HIGH (ref 70–99)
Potassium: 3.7 mmol/L (ref 3.5–5.1)
Sodium: 133 mmol/L — ABNORMAL LOW (ref 135–145)

## 2020-05-08 LAB — FIBRINOGEN: Fibrinogen: 763 mg/dL — ABNORMAL HIGH (ref 210–475)

## 2020-05-08 LAB — LACTATE DEHYDROGENASE: LDH: 162 U/L (ref 98–192)

## 2020-05-08 LAB — SARS CORONAVIRUS 2 (TAT 6-24 HRS): SARS Coronavirus 2: POSITIVE — AB

## 2020-05-08 LAB — FERRITIN: Ferritin: 842 ng/mL — ABNORMAL HIGH (ref 24–336)

## 2020-05-08 LAB — D-DIMER, QUANTITATIVE: D-Dimer, Quant: 0.9 ug/mL-FEU — ABNORMAL HIGH (ref 0.00–0.50)

## 2020-05-08 LAB — LACTIC ACID, PLASMA: Lactic Acid, Venous: 1.3 mmol/L (ref 0.5–1.9)

## 2020-05-08 LAB — TRIGLYCERIDES: Triglycerides: 155 mg/dL — ABNORMAL HIGH (ref <150)

## 2020-05-08 LAB — CBG MONITORING, ED: Glucose-Capillary: 98 mg/dL (ref 70–99)

## 2020-05-08 LAB — C-REACTIVE PROTEIN: CRP: 22.1 mg/dL — ABNORMAL HIGH (ref <1.0)

## 2020-05-08 LAB — BRAIN NATRIURETIC PEPTIDE: B Natriuretic Peptide: 141.4 pg/mL — ABNORMAL HIGH (ref 0.0–100.0)

## 2020-05-08 MED ORDER — ACETAMINOPHEN 325 MG PO TABS
650.0000 mg | ORAL_TABLET | Freq: Once | ORAL | Status: DC
  Filled 2020-05-08: qty 2

## 2020-05-08 NOTE — ED Provider Notes (Signed)
I saw and evaluated the patient, reviewed the resident's note and I agree with the findings and plan.  Pertinent History: This patient is a 55 year old male, he has a history of end-stage renal disease on dialysis Monday Wednesday and Friday.  He did go today.  He has been fully vaccinated and boosted for COVID but has had some hypoxia, he has had some fever, he is a little bit tachypneic though on his exam he does have some rales bilaterally.  Pertinent Exam findings: Rales, fever, mild tachypnea, no other obvious distress  I was personally present and directly supervised the following procedures:  Medical evaluation COVID testing Patient is requiring oxygen   EKG Interpretation  Date/Time:  Friday May 08 2020 11:29:14 EST Ventricular Rate:  100 PR Interval:  128 QRS Duration: 80 QT Interval:  340 QTC Calculation: 438 R Axis:   79 Text Interpretation: Normal sinus rhythm Normal ECG since last tracing no significant change Confirmed by Dale [name] [phone]) on 05/08/2020 3:55:41 PM       Dale [name] was evaluated in Emergency Department on 05/10/2020 for the symptoms described in the history of present illness. He was evaluated in the context of the global COVID-19 pandemic, which necessitated consideration that the patient might be at risk for infection with the SARS-CoV-2 virus that causes COVID-19. Institutional protocols and algorithms that pertain to the evaluation of patients at risk for COVID-19 are in a state of rapid change based on information released by regulatory bodies including the CDC and federal and state organizations. These policies and algorithms were followed during the patient's care in the ED.   I personally interpreted the EKG as well as the resident and agree with the interpretation on the resident's chart.  Final diagnoses:  [id number]  COVID      Dale Chapel, MD 05/10/20 1538

## 2020-05-08 NOTE — Progress Notes (Signed)
Patient will need to dialyze in isolation due to COVID positive status. His home clinic/[facility] runs COVID isolation 3rd shift MWF. Navigator will continue to follow and greatly appreciates updates from the medical team.  Alphonzo Cruise, [place] Renal Navigator [phone]

## 2020-05-08 NOTE — ED Triage Notes (Signed)
Pt reports increased sob for the past few days, states he wears 2-3L at home, arrives without his oxygen. 94% on room air, 3L applied in triage, at 100%. rr labored. Dialysis pt, MWF, went today. Pt is fully vaccinated and boosted for COVID. Febrile in triage, took tylenol around 430 this morning before dialysis.

## 2020-05-08 NOTE — ED Notes (Signed)
Pt called out stating he felt as if his blood sugar was dropping....checked CBG....results were 98. Nurse notified.

## 2020-05-08 NOTE — ED Notes (Signed)
E-signature pad unavailable at time of pt discharge. This RN discussed discharge materials with pt and answered all pt questions. Pt stated understanding of discharge material. ? ?

## 2020-05-08 NOTE — ED Provider Notes (Signed)
[facility] EMERGENCY DEPARTMENT Provider Note   CSN: [id number] Arrival date & time: 05/08/20  1121     History Chief Complaint  Patient presents with  . Shortness of Breath    Dale [name] is a 55 y.o. male.  The history is provided by the patient.  Shortness of Breath Severity:  Moderate Onset quality:  Gradual Duration:  2 days Timing:  Constant Progression:  Waxing and waning Chronicity:  New Relieved by:  Nothing Worsened by:  Nothing Ineffective treatments:  None tried Associated symptoms: cough   Associated symptoms: no abdominal pain, no chest pain, no ear pain, no fever, no rash, no sore throat and no vomiting        Past Medical History:  Diagnosis Date  . Anal fissure   . Anemia of chronic disease   . Arthritis    "hands, right knee" (03/19/2014) no cartlidge in right knee  . CAD (coronary artery disease)    a. Abnl nuc ->LHC (11/15):  mid to dist Dx 80%, mid RCA 99% (functional CTO), dist RCA 80% >> PCI: balloon angioplasty to mid RCA (could not deliver stent) - staged PCI Rotoblator atherectomy/DES to [facility]. b. NSTEMI 12/2015 s/p DES to diagonal.  . Cholecystitis    a. 08/27/2011  . Chronic diastolic CHF (congestive heart failure) ([place])    a. Echo 3/13:  EF 55-60%;  b. Echo (11/15):  Mild LVH, EF 60-65%, no RWMA, Gr 1 DD, MAC, mild LAE, normal RVF, mild RAE;  c.  Echo 5/16:  severe LVH, EF 55-60%, no RWMA, Gr 1 DD, MAC, mild LAE  . Claustrophobia    when things get around his face.   . Complication of anesthesia   . Difficult intubation    only once in 2015 at [facility] haven't had a problem since then  . Diverticulosis   . Esophageal dysmotility   . ESRD (end stage renal disease) ([place])    a. 1995 s/p cadaveric transplant w/ susbequent failure after 18 yrs;  b.Dialysis initiated 07/2011 [facility] M-W-F  . Fibromyalgia   . Gastroparesis   . GERD (gastroesophageal reflux disease)   . Gout    PMH  . History of blood transfusion    . History of kidney stones    early 1990's  . HLD (hyperlipidemia)   . Hypertension   . Hypothyroidism   . Inguinal lymphadenopathy    a. bilateral - s/p biopsy 07/2011  . Insulin dependent diabetes mellitus    Type I  . Monilial esophagitis ([place])   . Morbid obesity ([place])   . OSA (obstructive sleep apnea)    adjustable bed and uses O2 L at bedtime  . PAD (peripheral artery disease) (HCC)    a. s/p L BKA.  [name] Kitchen Pericardial effusion    a.  Small by CT 08/22/11;  b.  Large by CT 08/27/11. c. Not seen on 11/2015 echo.  . Pneumonia ~ 2007  . PONV (postoperative nausea and vomiting)    vomited once after a procedure.  . T1DM (type 1 diabetes mellitus) ([place])   . Tracheobronchomalacia     Patient Active Problem List   Diagnosis Date Noted  . Dyspnea 05/08/2020  . Multifocal pneumonia 05/05/2020  . Lymph node enlargement 08/13/2018  . Clotted renal dialysis arteriovenous graft ([place]) 08/02/2018  . Pressure injury of skin 08/02/2018  . Upper airway cough syndrome 08/11/2017  . Morbid obesity due to excess calories ([place]) 08/11/2017  . Chronic respiratory failure  with hypoxia ([place]) 08/10/2017  . GERD (gastroesophageal reflux disease) 07/29/2017  . Rash and nonspecific skin eruption 07/27/2017  . Contact dermatitis 07/27/2017  . ESRD on hemodialysis ([place])   . Sepsis ([place]) 07/12/2017  . T1DM (type 1 diabetes mellitus) ([place]) 07/12/2017  . Hypotension (arterial) 10/15/2016  . Heme positive stool   . IDDM with complications 38/33/3832  . Oxygen dependent 03/24/2016  . Visit for preventive health examination 03/24/2016  . Chronic diastolic heart failure ([place]) 02/25/2016  . Tracheobronchomalacia 02/04/2016  . Anemia of chronic disease   . Iron deficiency anemia   . Gastrointestinal hemorrhage with melena 12/21/2015  . Pulmonary edema 03/09/2015  . Chronic pain 03/09/2015  . Hyperkalemia 03/09/2015  . HLD (hyperlipidemia) 11/27/2014  . ESRD on dialysis ([place]) 09/30/2014  . Coronary artery  disease involving native coronary artery of native heart with unstable angina pectoris ([place]) 03/20/2014  . Allergic reaction to dye 03/20/2014  . NSTEMI (non-ST elevated myocardial infarction) ([place]) 03/19/2014  . HTN (hypertension) 02/18/2014  . ESRD (end stage renal disease) ([place]) 08/27/2011  . Abdominal pain 08/27/2011  . Pericardial effusion 08/27/2011  . History of renal transplant 07/22/2011  . S/P BKA (below knee amputation) ([place]) 07/22/2011  . OSA (obstructive sleep apnea) 07/22/2011  . DOE (dyspnea on exertion) 07/22/2011    Past Surgical History:  Procedure Laterality Date  . A/V FISTULAGRAM Left 07/26/2016   Procedure: A/V Fistulagram;  Surgeon: Waynetta Sandy, MD;  Location: [place] CV LAB;  Service: Cardiovascular;  Laterality: Left;  . A/V FISTULAGRAM N/A 07/11/2017   Procedure: A/V FISTULAGRAM - Left AV;  Surgeon: Waynetta Sandy, MD;  Location: [place] CV LAB;  Service: Cardiovascular;  Laterality: N/A;  . AMPUTATION OF REPLICATED TOES Right   . ANGIOPLASTY  04/09/2014   RCA   . APPENDECTOMY  ~ 2006  . ARTERIOVENOUS GRAFT PLACEMENT Left 1993?   forearm  . ARTERIOVENOUS GRAFT PLACEMENT Right 1993?   leg  . ARTERIOVENOUS GRAFT PLACEMENT Left    Thigh  . Arteriovenous Graft Removed Left    Thigh  . AV FISTULA PLACEMENT  07/29/2011   Procedure: ARTERIOVENOUS (AV) FISTULA CREATION;  Surgeon: Mal Misty, MD;  Location: [facility];  Service: Vascular;  Laterality: Right;  Brachial cephalic  . AV FISTULA PLACEMENT Left 01/20/2015   Procedure: LIGATION OF RIGHT ARTERIOVENOUS FISTULA WITH EXCISION OF ANEURYSM;  Surgeon: Angelia Mould, MD;  Location: [facility];  Service: Vascular;  Laterality: Left;  . AV FISTULA PLACEMENT Left 04/30/2015   Procedure: CREATION OF LEFT ARM BRACHIO-CEPHALIC ARTERIOVENOUS (AV) FISTULA ;  Surgeon: Angelia Mould, MD;  Location: [facility];  Service: Vascular;  Laterality: Left;  . AV Graft PLACEMENT Left 1993?    "attempted one in my wrist; didn't take"  . BASCILIC VEIN TRANSPOSITION Left 11/01/2018   Procedure: FIRST STAGE BASILIC VEIN TRANSPOSITION  LEFT ARM;  Surgeon: Serafina Umeka Wrench, MD;  Location: [facility];  Service: Vascular;  Laterality: Left;  . BASCILIC VEIN TRANSPOSITION Left 12/27/2018   Procedure: SECOND STAGE BASILIC VEIN TRANSPOSITION  LEFT ARM;  Surgeon: Serafina Bomani Oommen, MD;  Location: [facility];  Service: Vascular;  Laterality: Left;  . BELOW KNEE LEG AMPUTATION Left 2010  . CARDIAC CATHETERIZATION  03/19/2014  . CARDIAC CATHETERIZATION  03/19/2014   Procedure: CORONARY BALLOON ANGIOPLASTY;  Surgeon: Burnell Blanks, MD;  Location: [facility] CATH LAB;  Service: Cardiovascular;;  . CARDIAC CATHETERIZATION N/A 01/01/2016   Procedure: Left Heart Cath and Coronary Angiography;  Surgeon: Troy Sine, MD;  Location: [facility] CV LAB;  Service: Cardiovascular;  Laterality: N/A;  . CARDIAC CATHETERIZATION N/A 01/01/2016   Procedure: Coronary Stent Intervention;  Surgeon: Troy Sine, MD;  Location: [facility] CV LAB;  Service: Cardiovascular;  Laterality: N/A;  . CARPAL TUNNEL RELEASE Bilateral after 2005  . CATARACT EXTRACTION W/ INTRAOCULAR LENS  IMPLANT, BILATERAL Bilateral 1990's  . COLONOSCOPY  08/29/2011   Procedure: COLONOSCOPY;  Surgeon: Jerene Bears, MD;  Location: [facility];  Service: Gastroenterology;  Laterality: N/A;  . COLONOSCOPY WITH PROPOFOL N/A 04/26/2016   Procedure: COLONOSCOPY WITH PROPOFOL;  Surgeon: Jerene Bears, MD;  Location: [facility] ENDOSCOPY;  Service: Gastroenterology;  Laterality: N/A;  . CORONARY ANGIOPLASTY WITH STENT PLACEMENT  04/09/2014       ptca/des mid lad   . ESOPHAGOGASTRODUODENOSCOPY N/A 12/25/2015   Procedure: ESOPHAGOGASTRODUODENOSCOPY (EGD);  Surgeon: Mauri Pole, MD;  Location: [facility] ENDOSCOPY;  Service: Endoscopy;  Laterality: N/A;  bedside  . ESOPHAGOGASTRODUODENOSCOPY (EGD) WITH PROPOFOL N/A 04/26/2016   Procedure: ESOPHAGOGASTRODUODENOSCOPY (EGD) WITH  PROPOFOL;  Surgeon: Jerene Bears, MD;  Location: [facility] ENDOSCOPY;  Service: Gastroenterology;  Laterality: N/A;  . EYE SURGERY Bilateral    cataract  . FEMORAL ARTERY REPAIR  04/09/2014   ANGIOSEAL  . INSERTION OF DIALYSIS CATHETER  08/01/2011   Procedure: INSERTION OF DIALYSIS CATHETER;  Surgeon: Rosetta Posner, MD;  Location: [facility];  Service: Vascular;  Laterality: Right;  insertion of dialysis catheter on right internal jugular vein  . INSERTION OF DIALYSIS CATHETER Right 01/20/2015   Procedure: INSERTION OF DIALYSIS CATHETER - RIGHT INTERNAL JUGULAR ;  Surgeon: Angelia Mould, MD;  Location: [facility];  Service: Vascular;  Laterality: Right;  . INSERTION OF DIALYSIS CATHETER Right 08/02/2018   Procedure: Insertion Of Dialysis Catheter;  Surgeon: Angelia Mould, MD;  Location[facility] OR;  Service: Vascular;  Laterality: Right;  . KIDNEY TRANSPLANT  04/25/1993  . LEFT HEART CATHETERIZATION WITH CORONARY ANGIOGRAM N/A 03/19/2014   Procedure: LEFT HEART CATHETERIZATION WITH CORONARY ANGIOGRAM;  Surgeon: Burnell Blanks, MD;  Location: [facility] CATH LAB;  Service: Cardiovascular;  Laterality: N/A;  . LIGATION OF ARTERIOVENOUS  FISTULA Left 11/01/2018   Procedure: Ligation Of  Brachio Cephalic Arteriovenous  Fistula;  Surgeon: Serafina Karysa Heft, MD;  Location: [facility] OR;  Service: Vascular;  Laterality: Left;  . LIGATION OF ARTERIOVENOUS  FISTULA Left 02/19/2019   Procedure: LIGATION OF ARTERIOVENOUS  FISTULA LEFT ARM;  Surgeon: Angelia Mould, MD;  Location: [facility];  Service: Vascular;  Laterality: Left;  . LYMPH NODE BIOPSY  08/10/2011   Procedure: LYMPH NODE BIOPSY;  Surgeon: Merrie Roof, MD;  Location: [facility];  Service: General;  Laterality: Left;  left groin lymph biopsy  . NEUROPLASTY / TRANSPOSITION ULNAR NERVE AT ELBOW Left after 2005  . PATCH ANGIOPLASTY Left 08/09/2016   Procedure: LEFT  ARTERIOVENOUS FISTULA PATCH ANGIOPLASTY;  Surgeon: Waynetta Sandy, MD;  Location: [place];   Service: Vascular;  Laterality: Left;  . PERCUTANEOUS CORONARY ROTOBLATOR INTERVENTION (PCI-R) N/A 04/09/2014   Procedure: PERCUTANEOUS CORONARY ROTOBLATOR INTERVENTION (PCI-R);  Surgeon: Burnell Blanks, MD;  Location: [facility] CATH LAB;  Service: Cardiovascular;  Laterality: N/A;  . PERIPHERAL VASCULAR BALLOON ANGIOPLASTY  07/11/2017   Procedure: PERIPHERAL VASCULAR BALLOON ANGIOPLASTY;  Surgeon: Waynetta Sandy, MD;  Location: [place] CV LAB;  Service: Cardiovascular;;  left arm AV fistula  . PERIPHERAL VASCULAR CATHETERIZATION N/A 12/25/2014   Procedure: Upper Extremity  Venography;  Surgeon: Conrad Pierron, MD;  Location: [place] CV LAB;  Service: Cardiovascular;  Laterality: N/A;  . PERIPHERAL VASCULAR CATHETERIZATION Left 02/24/2016   Procedure: Fistulagram;  Surgeon: Waynetta Sandy, MD;  Location: [place] CV LAB;  Service: Cardiovascular;  Laterality: Left;  . PERIPHERAL VASCULAR CATHETERIZATION Left 02/24/2016   Procedure: Peripheral Vascular Intervention;  Surgeon: Waynetta Sandy, MD;  Location: [place] CV LAB;  Service: Cardiovascular;  Laterality: Left;  AV FISTULA  . PERITONEAL CATHETER INSERTION    . PERITONEAL CATHETER REMOVAL    . REVISON OF ARTERIOVENOUS FISTULA Right 05/13/4172   Procedure: PLICATION / REVISION OF ARTERIOVENOUS FISTULA;  Surgeon: Angelia Mould, MD;  Location: [place] [facility];  Service: Vascular;  Laterality: Right;  . REVISON OF ARTERIOVENOUS FISTULA Left 09/01/2015   Procedure: SUPERFICIALIZATION OF LEFT ARM BRACHIOCEPHALIC ARTERIOVENOUS FISTULA;  Surgeon: Angelia Mould, MD;  Location: [place];  Service: Vascular;  Laterality: Left;  . REVISON OF ARTERIOVENOUS FISTULA Left 08/09/2016   Procedure: REVISION OF ARTERIAL ANASTOMOSIS OF ARTERIOVENOUS FISTULA;  Surgeon: Waynetta Sandy, MD;  Location: [facility];  Service: Vascular;  Laterality: Left;  . SHOULDER ARTHROSCOPY Left ~ 2006   frozen  . THROMBECTOMY W/  EMBOLECTOMY Left 08/02/2018   Procedure: THROMBECTOMY ARTERIOVENOUS FISTULA;  Surgeon: Angelia Mould, MD;  Location: [place];  Service: Vascular;  Laterality: Left;  . TOE AMPUTATION Bilateral after 2005  . UMBILICAL HERNIA REPAIR  1990's  . UPPER EXTREMITY VENOGRAPHY Bilateral 10/16/2018   Procedure: UPPER EXTREMITY VENOGRAPHY;  Surgeon: Serafina Tykeria Wawrzyniak, MD;  Location: [place] CV LAB;  Service: Cardiovascular;  Laterality: Bilateral;  . VENOGRAM Right 07/28/2011   Procedure: VENOGRAM;  Surgeon: Conrad Estacada, MD;  Location: [facility] CATH LAB;  Service: Cardiovascular;  Laterality: Right;  . VITRECTOMY Bilateral        Family History  Problem Relation Age of Onset  . Colon polyps Father   . Diabetes Father   . Hypertension Father   . Hypertension Mother   . Diabetes Mother   . Hyperlipidemia Mother   . Breast cancer Maternal Aunt   . Bone cancer Maternal Aunt   . Liver cancer Maternal Aunt   . Malignant hyperthermia Neg Hx   . Heart attack Neg Hx   . Stroke Neg Hx   . Colon cancer Neg Hx   . Stomach cancer Neg Hx   . Pancreatic cancer Neg Hx   . Esophageal cancer Neg Hx     Social History   Tobacco Use  . Smoking status: Never Smoker  . Smokeless tobacco: Never Used  Vaping Use  . Vaping Use: Never used  Substance Use Topics  . Alcohol use: Yes    Comment: rare  . Drug use: No    Home Medications Prior to Admission medications   Medication Sig Start Date End Date Taking? Authorizing Provider  acetaminophen (TYLENOL) 500 MG tablet Take 1,000 mg by mouth every 6 (six) hours as needed (pain/headaches.).    Yes [provider]  allopurinol (ZYLOPRIM) 100 MG tablet TAKE 2 TABLETS BY MOUTH DAILY 05/23/19  Yes Bonnita Hollow, MD  aspirin EC 81 MG tablet Take 81 mg by mouth at bedtime.   Yes [provider]  atorvastatin (LIPITOR) 20 MG tablet Take 20 mg by mouth daily. 02/12/19  Yes [provider]  B Complex-C-Folic Acid (DIALYVITE 081) 0.8  MG TABS Take 1 tablet by mouth daily.   Yes [provider]  calcium acetate (PHOSLO) 667 MG capsule Take 667-1,334 mg by mouth See admin instructions. Take 2 capsules (1334 mg) with each meal and 1 capsule (667 mg) with each snack 09/19/12  Yes [provider]  cetirizine (ZYRTEC) 10 MG tablet Take 10 mg by mouth daily.   Yes [provider]  diphenhydrAMINE (BENADRYL) 25 MG tablet Take 50 mg by mouth at bedtime.   Yes [provider]  docusate sodium (COLACE) 100 MG capsule Take 100 mg by mouth 2 (two) times daily.    Yes [provider]  famotidine (PEPCID) 20 MG tablet Take 20 mg by mouth daily as needed for heartburn or indigestion.    Yes [provider]  fluticasone (FLONASE) 50 MCG/ACT nasal spray Place 2 sprays into both nostrils daily as needed for allergies.    Yes [provider]  gabapentin (NEURONTIN) 300 MG capsule Take 300 mg by mouth at bedtime.   Yes [provider]  GLUCAGON EMERGENCY 1 MG injection Inject 1 mg into the muscle daily as needed (low blood sugar).  01/19/12  Yes [provider]  Glucose-Cholecalciferol (RELION GLUCOSE SHOT) LIQD Take 1 Dose by mouth daily as needed (low blood sugar).   Yes [provider]  hydrocortisone 2.5 % cream Apply 1 application topically 2 (two) times daily as needed (rash / itching).   Yes [provider]  HYDROmorphone (DILAUDID) 2 MG tablet Take 2 mg by mouth 2 (two) times daily as needed for moderate pain or severe pain.    Yes [provider]  insulin lispro (HUMALOG) 100 UNIT/ML injection Inject 75-120 Units into the skin continuous. Per insulin pump   Yes [provider]  levothyroxine (SYNTHROID, LEVOTHROID) 112 MCG tablet Take 112 mcg by mouth at bedtime.    Yes [provider]  loperamide (IMODIUM A-D) 2 MG tablet Take 2-4 mg by mouth 4 (four) times daily as needed for diarrhea or loose stools.   Yes [provider]  Melatonin 5 MG TABS Take 5 mg by mouth at bedtime.   Yes [provider]  metoCLOPramide (REGLAN) 10 MG tablet Take 10 mg by mouth 4 (four) times daily -  before meals and at bedtime.    Yes [provider]  midodrine (PROAMATINE) 10 MG tablet Take 1 tablet (10 mg total) by mouth 3 (three) times daily. Takes 1 tab in mornings of dialysis and takes 2 tabs 30 mins before starting dialysis (m,w,f) Patient taking differently: Take 10-20 mg by mouth See admin instructions. Take 2 tablets (20 mg) 1 before dialysis treatment on Mon, Wed, and Fri & 1 tablet (10 mg) by mouth as needed for low blood pressure 04/14/16  Yes Elgergawy, Silver Huguenin, MD  Polyethyl Glycol-Propyl Glycol (SYSTANE ULTRA PF OP) Place 1 drop into both eyes every 2 (two) hours as needed (dry eyes).   Yes [provider]  Polyethyl Glycol-Propyl Glycol 0.4-0.3 % SOLN Place 1 drop into both eyes 4 (four) times daily.   Yes [provider]  polyethylene glycol (MIRALAX / GLYCOLAX) packet Take 17 g by mouth daily. Patient taking differently: Take 17 g by mouth daily as needed for mild constipation. 02/06/16  Yes Regalado, Belkys A, MD  prasugrel (EFFIENT) 10 MG TABS tablet Take 1 tablet (10 mg total) by mouth daily. Please keep upcoming appt in January 2022 before anymore refills. Thank you 03/02/20  Yes Burnell Blanks, MD  promethazine (PHENERGAN) 25 MG tablet Take 25 mg by mouth every  6 (six) hours as needed for nausea or vomiting.   Yes [provider]  rOPINIRole (REQUIP) 2 MG tablet Take 2 mg by mouth at bedtime.   Yes [provider]  sevelamer carbonate (RENVELA) 800 MG tablet Take 800 mg by mouth 3 (three) times daily with meals.   Yes [provider]  testosterone cypionate (DEPOTESTOSTERONE CYPIONATE) 200 MG/ML injection Inject 200 mg into the muscle every 14 (fourteen) days.  09/10/16  Yes [provider]  nitroGLYCERIN (NITROSTAT) 0.4 MG SL  tablet Place 0.4 mg under the tongue every 5 (five) minutes as needed for chest pain.    [provider]    Allergies    Iodinated diagnostic agents, Prednisone, Adhesive [tape], Amoxicillin, Clindamycin/lincomycin, Enalapril, Mobic [meloxicam], Morphine, Brilinta [ticagrelor], Ciprofloxacin, Clopidogrel, Codeine, Doxycycline, Hydrocodone, Levofloxacin, Oxycodone, and Rocephin [ceftriaxone]  Review of Systems   Review of Systems  Constitutional: Negative for chills and fever.  HENT: Negative for ear pain and sore throat.   Eyes: Negative for pain and visual disturbance.  Respiratory: Positive for cough and shortness of breath.   Cardiovascular: Negative for chest pain and palpitations.  Gastrointestinal: Negative for abdominal pain and vomiting.  Genitourinary: Negative for dysuria and hematuria.  Musculoskeletal: Negative for arthralgias and back pain.  Skin: Negative for color change and rash.  Neurological: Negative for seizures and syncope.  All other systems reviewed and are negative.   Physical Exam Updated Vital Signs BP (!) 144/63   Pulse 91   Temp 99.3 F (37.4 C) (Oral)   Resp (!) 23   SpO2 99%   Physical Exam Vitals and nursing note reviewed.  Constitutional:      Appearance: He is well-developed and well-nourished. He is obese. He is ill-appearing.  HENT:     Head: Normocephalic and atraumatic.  Eyes:     Conjunctiva/sclera: Conjunctivae normal.  Cardiovascular:     Rate and Rhythm: Normal rate and regular rhythm.     Heart sounds: No murmur heard.   Pulmonary:     Effort: Pulmonary effort is normal. Tachypnea present. No respiratory distress.     Breath sounds: Examination of the right-lower field reveals rales. Examination of the left-lower field reveals rales. Rales present.  Abdominal:     Palpations: Abdomen is soft.     Tenderness: There is no abdominal tenderness.  Musculoskeletal:     Cervical back: Neck supple.     Right lower leg:  Edema (trace-compression stockings in place) present.     Comments: L BKA  Skin:    General: Skin is warm and dry.  Neurological:     Mental Status: He is alert.  Psychiatric:        Mood and Affect: Mood and affect normal.     ED Results / Procedures / Treatments   Labs (all labs ordered are listed, but only abnormal results are displayed) Labs Reviewed  SARS CORONAVIRUS 2 (TAT 6-24 HRS) - Abnormal; Notable for the following components:      Result Value   SARS Coronavirus 2 POSITIVE (*)    All other components within normal limits  BASIC METABOLIC PANEL - Abnormal; Notable for the following components:   Sodium 133 (*)    Chloride 89 (*)    Glucose, Bld 224 (*)    Creatinine, Ser 4.84 (*)    Calcium 8.4 (*)    GFR, Estimated 13 (*)    All other components within normal limits  CBC - Abnormal; Notable for the following components:  WBC 13.6 (*)    RBC 3.29 (*)    Hemoglobin 10.8 (*)    HCT 34.5 (*)    MCV 104.9 (*)    All other components within normal limits  D-DIMER, QUANTITATIVE (NOT AT [facility]) - Abnormal; Notable for the following components:   D-Dimer, Quant 0.90 (*)    All other components within normal limits  FERRITIN - Abnormal; Notable for the following components:   Ferritin 842 (*)    All other components within normal limits  TRIGLYCERIDES - Abnormal; Notable for the following components:   Triglycerides 155 (*)    All other components within normal limits  FIBRINOGEN - Abnormal; Notable for the following components:   Fibrinogen 763 (*)    All other components within normal limits  C-REACTIVE PROTEIN - Abnormal; Notable for the following components:   CRP 22.1 (*)    All other components within normal limits  BRAIN NATRIURETIC PEPTIDE - Abnormal; Notable for the following components:   B Natriuretic Peptide 141.4 (*)    All other components within normal limits  HEPATIC FUNCTION PANEL - Abnormal; Notable for the following components:   Albumin 3.3  (*)    All other components within normal limits  RESP PANEL BY RT-PCR (FLU A&B, COVID) ARPGX2  LACTIC ACID, PLASMA  PROCALCITONIN  LACTATE DEHYDROGENASE  CBG MONITORING, ED    EKG EKG Interpretation  Date/Time:  Friday May 08 2020 11:29:14 EST Ventricular Rate:  100 PR Interval:  128 QRS Duration: 80 QT Interval:  340 QTC Calculation: 438 R Axis:   79 Text Interpretation: Normal sinus rhythm Normal ECG since last tracing no significant change Confirmed by Noemi Chapel [phone]) on 05/08/2020 3:55:41 PM   Radiology DG Chest 1 View  Result Date: 05/08/2020 CLINICAL DATA:  Shortness of breath, fever EXAM: CHEST  1 VIEW COMPARISON:  04/16/2020 FINDINGS: Right internal jugular approach hemodialysis catheter terminates at the level of the right atrium. Stable cardiomegaly. Pulmonary vascular congestion. Diffuse bilateral interstitial prominence. Suspect trace bilateral pleural effusions. No pneumothorax. Vascular stent in the left axillary region. IMPRESSION: Findings suggestive of CHF with interstitial edema and trace bilateral pleural effusions. A superimposed infectious process would be difficult to exclude. Electronically Signed   By: Davina Poke D.O.   On: 05/08/2020 11:58    Procedures Procedures (including critical care time)  Medications Ordered in ED Medications  acetaminophen (TYLENOL) tablet 650 mg (has no administration in time range)    ED Course  I have reviewed the triage vital signs and the nursing notes.  Pertinent labs & imaging results that were available during my care of the patient were reviewed by me and considered in my medical decision making (see chart for details).    MDM Rules/Calculators/A&P                          This is a 55 year old male with a past medical history of end-stage renal disease on hemodialysis Monday/Wednesday/Friday, who presents emergency department for 2 days of cough and shortness of breath, reports that he had a  positive COVID test yesterday, did complete his dialysis as scheduled today for a full session.  On arrival he is afebrile, hemodynamically stable, does have some increased work of breathing with tachypnea and has active coughing.  He has an obese and mildly distended abdomen that is nontender and nonfocal without peritoneal signs.  He has bibasilar rales, otherwise lungs are clear.  There is trace right lower  extremity edema with presence of a left BKA.  Work-up in the emergency department shows normal glucose, lactic acid is within normal limits, D-dimer is mildly elevated 0.90 believe this to be acute phase in response to his COVID, he is not hypoxic or tachycardic concerning for PE at this time.  His procalcitonin was elevated.  His ferritin is elevated as well which is likely again an acute phase.  CRP 22, BNP is 141.  Patient is saturating well on his home 2 to 3 L, however he is tachypneic and is having significant coughing episodes in the room.  Given the patient's acute phase elevations, mildly elevated BNP in the setting of obesity, and comorbidities, believe he would benefit from admission for possible observation to ensure his respiratory status does not worsen as he is early in his COVID disease.  Patient will be admitted to family medicine, I did confirm that the patient does not make urine.  I will try to make contact with nephrology, although he does not need emergent dialysis, may be worthwhile to have the patient run for a few hours for volume status during his stay.  Final Clinical Impression(s) / ED Diagnoses Final diagnoses:  [id number]    Rx / DC Orders ED Discharge Orders    None       Camila Li, MD 05/08/20 2022    Noemi Chapel, MD 05/10/20 1538

## 2020-05-08 NOTE — ED Notes (Signed)
MD speaking to pt over phone about pt d/c

## 2020-05-08 NOTE — Discharge Instructions (Signed)
Dear [name],  Thank you for letting us participate in your care. You were seen for shortness of breath and diagnosed with COVID.  POST-HOSPITAL & CARE INSTRUCTIONS 1. We have order COVID treatment outpatient.   2. Reasons to return to care:  Significantly short of breath and requring more oxygen than your home. Chest Pain. Increased nausea, vomiting, and dehydration that is not controlled.   3. Continue dialysis as scheduled   DOCTOR'S APPOINTMENT   Future Appointments  Date Time Provider [place]  05/11/2020  3:15 PM Liliane Shi, PA-C [facility]  05/26/2020  8:45 AM Melvyn Novas, Christena Deem, MD [facility] None     Take care and be well!  [facility]  [place], [place] [address] [phone]      .

## 2020-05-08 NOTE — Consult Note (Addendum)
Family Medicine Teaching Service Consult Note  Patient name: Dale [name] record number: [id number] Date of birth: 1966/02/10 Age: 55 y.o. Gender: male Date of Consult: 05/08/2020   Admitting Physician: Lind Covert, MD  Primary Care Provider: Donney Dice, DO  Chief Complaint: Dyspnea  Assessment and Plan: Dale [name] is a 55 y.o. male presenting with dyspnea and found to be COVID-positive. PMH is significant for s/p BKA 2010, history of renal transplant, ESRD on dialysis, CAD, HLD, HTN, CHF, T1DM, GERD.  Dyspnea likely secondary to COVID-19 versus CHF exacerbation: Patient presents with dyspnea x2 days. In the ED was febrile to 101.1 now improved to 99.3, mildly tachypneic, and initially requiring 3L O2 (on 2L baseline at home). He was found to be COVID-19 positive w/ home test 1/13. COVID PCR positive here with elevated inflammatory markers consistent with COVID-19 diagnosis. Patient also with a chest x-ray showing findings consistent with CHF with interstitial edema and trace bilateral pleural effusions. His BNP was elevated 141.4, unsure of his baseline although last labs in 2016, 2017 were as high as 1180. His last echo was (11/01/2016) with an ejection fraction 60-65% with grade 1 diastolic dysfunction. During exam, he does not show increase work of breathing. He speaks in full sentences and O2 saturation remained above 94% on RA during the duration of our conversation. Upper lung fields are clear to ascultation, lower lung fields with minimal rales and he is moving good air. There is no lower extremity edema in his right leg. He is ESRD and does not make urine. Last session was today and he states he was told at dialysis that he is below his dry wt. which is 127 kg. He endorses feeling better breathing wise after dialysis. When discussed overnight observation patient prefers to be discharged home due to snow storm coming soon and would like to be home with his wife. With  respiratory status improved below his baseline oxygen requirement and low concern for fluid overload state we have used shared decision making with the patient and will discharge home with strict return precaution and outpatient COVID treatment. This case was discussed in length with Attending Dr. Erin Hearing prior to the decision to discharge.  -Discharge from ED -Referral for outpatient COVID treatment -Return precautions discussed -Continue home O2 as needed.   Significant Labs and Imaging:  Recent Labs  Lab 05/08/20 1128  WBC 13.6*  HGB 10.8*  HCT 34.5*  PLT 200   Recent Labs  Lab 05/08/20 1128 05/08/20 1628  NA 133*  --   K 3.7  --   CL 89*  --   CO2 29  --   GLUCOSE 224*  --   BUN 13  --   CREATININE 4.84*  --   CALCIUM 8.4*  --   ALKPHOS  --  68  AST  --  20  ALT  --  14  ALBUMIN  --  3.3*   CHEST  1 VIEW  COMPARISON:  04/16/2020  FINDINGS: Right internal jugular approach hemodialysis catheter terminates at the level of the right atrium. Stable cardiomegaly. Pulmonary vascular congestion. Diffuse bilateral interstitial prominence. Suspect trace bilateral pleural effusions. No pneumothorax. Vascular stent in the left axillary region.  IMPRESSION: Findings suggestive of CHF with interstitial edema and trace bilateral pleural effusions. A superimposed infectious process would be difficult to exclude.    Results/Tests Pending at Time of Discharge: N/A  Discharge Instructions: Please refer to Patient Instructions section of EMR for full  details.  Patient was counseled important signs and symptoms that should prompt return to medical care, changes in medications, dietary instructions, activity restrictions, and follow up appointments.    Gerlene Fee, DO 05/08/2020, 8:59 PM PGY-2, [place]

## 2020-05-09 ENCOUNTER — Telehealth (HOSPITAL_COMMUNITY): Payer: Self-pay

## 2020-05-09 ENCOUNTER — Ambulatory Visit (HOSPITAL_COMMUNITY)
Admission: RE | Admit: 2020-05-09 | Discharge: 2020-05-09 | Disposition: A | Discharge status: Home / Self Care | Payer: Medicare Other | Source: Ambulatory Visit | Attending: Pulmonary Disease | Admitting: Pulmonary Disease

## 2020-05-09 ENCOUNTER — Other Ambulatory Visit: Payer: Self-pay | Admitting: Physician Assistant

## 2020-05-09 DIAGNOSIS — I5032 Chronic diastolic (congestive) heart failure: Secondary | ICD-10-CM

## 2020-05-09 DIAGNOSIS — E1022 Type 1 diabetes mellitus with diabetic chronic kidney disease: Secondary | ICD-10-CM

## 2020-05-09 DIAGNOSIS — I132 Hypertensive heart and chronic kidney disease with heart failure and with stage 5 chronic kidney disease, or end stage renal disease: Secondary | ICD-10-CM | POA: Diagnosis not present

## 2020-05-09 DIAGNOSIS — I10 Essential (primary) hypertension: Secondary | ICD-10-CM

## 2020-05-09 DIAGNOSIS — Z89512 Acquired absence of left leg below knee: Secondary | ICD-10-CM

## 2020-05-09 DIAGNOSIS — Z992 Dependence on renal dialysis: Secondary | ICD-10-CM

## 2020-05-09 DIAGNOSIS — U071 COVID-19: Secondary | ICD-10-CM

## 2020-05-09 DIAGNOSIS — I25110 Atherosclerotic heart disease of native coronary artery with unstable angina pectoris: Secondary | ICD-10-CM

## 2020-05-09 DIAGNOSIS — N186 End stage renal disease: Secondary | ICD-10-CM

## 2020-05-09 MED ORDER — EPINEPHRINE 0.3 MG/0.3ML IJ SOAJ: 0.3000 mg | Freq: Once | INTRAMUSCULAR | Status: DC | PRN

## 2020-05-09 MED ORDER — DIPHENHYDRAMINE HCL 50 MG/ML IJ SOLN: 50.0000 mg | Freq: Once | INTRAMUSCULAR | Status: DC | PRN

## 2020-05-09 MED ORDER — SODIUM CHLORIDE 0.9 % IV SOLN: INTRAVENOUS | Status: DC | PRN

## 2020-05-09 MED ORDER — METHYLPREDNISOLONE SODIUM SUCC 125 MG IJ SOLR: 125.0000 mg | Freq: Once | INTRAMUSCULAR | Status: DC | PRN

## 2020-05-09 MED ORDER — ALBUTEROL SULFATE HFA 108 (90 BASE) MCG/ACT IN AERS: 2.0000 | INHALATION_SPRAY | Freq: Once | RESPIRATORY_TRACT | Status: DC | PRN

## 2020-05-09 MED ORDER — FAMOTIDINE IN NACL 20-0.9 MG/50ML-% IV SOLN: 20.0000 mg | Freq: Once | INTRAVENOUS | Status: DC | PRN

## 2020-05-09 MED ORDER — MOLNUPIRAVIR EUA 200MG CAPSULE: 4.0000 | ORAL_CAPSULE | Freq: Two times a day (BID) | ORAL | 0 refills | Status: AC

## 2020-05-09 MED ORDER — SOTROVIMAB 500 MG/8ML IV SOLN
500.0000 mg | Freq: Once | INTRAVENOUS | Status: AC
  Administered 2020-05-09: 500 mg via INTRAVENOUS

## 2020-05-09 NOTE — Progress Notes (Signed)
I connected by phone with Dale [name] on 05/09/2020 at 9:06 AM to discuss the potential use of a new treatment for mild to moderate COVID-19 viral infection in non-hospitalized patients.  This patient is a 55 y.o. male that meets the FDA criteria for Emergency Use Authorization of COVID monoclonal antibody sotrovimab.  Has a (+) direct SARS-CoV-2 viral test result  Has mild or moderate COVID-19   Is NOT hospitalized due to COVID-19  Is within 10 days of symptom onset  Has at least one of the high risk factor(s) for progression to severe COVID-19 and/or hospitalization as defined in EUA.  Specific high risk criteria : BMI > 25, Chronic Kidney Disease (CKD), Diabetes, Cardiovascular disease or hypertension and Other high risk medical condition per CDC:  high SVI   I have spoken and communicated the following to the patient or parent/caregiver regarding COVID monoclonal antibody treatment:  1. FDA has authorized the emergency use for the treatment of mild to moderate COVID-19 in adults and pediatric patients with positive results of direct SARS-CoV-2 viral testing who are 47 years of age and older weighing at least 40 kg, and who are at high risk for progressing to severe COVID-19 and/or hospitalization.  2. The significant known and potential risks and benefits of COVID monoclonal antibody, and the extent to which such potential risks and benefits are unknown.  3. Information on available alternative treatments and the risks and benefits of those alternatives, including clinical trials.  4. Patients treated with COVID monoclonal antibody should continue to self-isolate and use infection control measures (e.g., wear mask, isolate, social distance, avoid sharing personal items, clean and disinfect "high touch" surfaces, and frequent handwashing) according to CDC guidelines.   5. The patient or parent/caregiver has the option to accept or refuse COVID monoclonal antibody treatment.  After  reviewing this information with the patient, the patient has agreed to receive one of the available covid 19 monoclonal antibodies and will be provided an appropriate fact sheet prior to infusion.   Sx onset 1/12. Pt very high risk. He is vaccinated. Set up for infusion today. Aware insurance will be charged an infusion fee.   [name], PA-C 05/09/2020 9:06 AM

## 2020-05-09 NOTE — Progress Notes (Signed)
Outpatient Oral COVID Treatment Note  I connected with Dale [name] on 05/09/2020/9:10 AM by telephone and verified that I am speaking with the correct person using two identifiers.  I discussed the limitations, risks, security, and privacy concerns of performing an evaluation and management service by telephone and the availability of in person appointments. I also discussed with the patient that there may be a patient responsible charge related to this service. The patient expressed understanding and agreed to proceed.  Patient location: home Provider location: office   Diagnosis: COVID-19 infection  Purpose of visit: Discussion of potential use of Molnupiravir or Paxlovid, a new treatment for mild to moderate COVID-19 viral infection in non-hospitalized patients.   Subjective: Patient is a 55 y.o. male who has been diagnosed with COVID 19 viral infection.  Their symptoms began on 1/12 with fatigue, cough, body aches.    Past Medical History:  Diagnosis Date  . Anal fissure   . Anemia of chronic disease   . Arthritis    "hands, right knee" (03/19/2014) no cartlidge in right knee  . CAD (coronary artery disease)    a. Abnl nuc ->LHC (11/15):  mid to dist Dx 80%, mid RCA 99% (functional CTO), dist RCA 80% >> PCI: balloon angioplasty to mid RCA (could not deliver stent) - staged PCI Rotoblator atherectomy/DES to [facility]. b. NSTEMI 12/2015 s/p DES to diagonal.  . Cholecystitis    a. 08/27/2011  . Chronic diastolic CHF (congestive heart failure) ([place])    a. Echo 3/13:  EF 55-60%;  b. Echo (11/15):  Mild LVH, EF 60-65%, no RWMA, Gr 1 DD, MAC, mild LAE, normal RVF, mild RAE;  c.  Echo 5/16:  severe LVH, EF 55-60%, no RWMA, Gr 1 DD, MAC, mild LAE  . Claustrophobia    when things get around his face.   . Complication of anesthesia   . Difficult intubation    only once in 2015 at [facility] haven't had a problem since then  . Diverticulosis   . Esophageal dysmotility   . ESRD (end stage renal  disease) ([place])    a. 1995 s/p cadaveric transplant w/ susbequent failure after 18 yrs;  b.Dialysis initiated 07/2011 [facility] M-W-F  . Fibromyalgia   . Gastroparesis   . GERD (gastroesophageal reflux disease)   . Gout    PMH  . History of blood transfusion   . History of kidney stones    early 1990's  . HLD (hyperlipidemia)   . Hypertension   . Hypothyroidism   . Inguinal lymphadenopathy    a. bilateral - s/p biopsy 07/2011  . Insulin dependent diabetes mellitus    Type I  . Monilial esophagitis ([place])   . Morbid obesity ([place])   . OSA (obstructive sleep apnea)    adjustable bed and uses O2 L at bedtime  . PAD (peripheral artery disease) ([facility])    a. s/p L BKA.  [name] Kitchen Pericardial effusion    a.  Small by CT 08/22/11;  b.  Large by CT 08/27/11. c. Not seen on 11/2015 echo.  . Pneumonia ~ 2007  . PONV (postoperative nausea and vomiting)    vomited once after a procedure.  . T1DM (type 1 diabetes mellitus) ([place])   . Tracheobronchomalacia     Allergies  Allergen Reactions  . Iodinated Diagnostic Agents Itching, Rash and Other (See Comments)    Systemic itching and transient rash appearance within hours of receiving contrast    . Prednisone Other (See Comments)  Increases sugar levels  . Adhesive [Tape] Itching and Other (See Comments)    Blisters on arm from adhesive tape at dialysis   . Amoxicillin Itching, Rash and Other (See Comments)    Did it involve swelling of the face/tongue/throat, SOB, or low BP? No Did it involve sudden or severe rash/hives, skin peeling, or any reaction on the inside of your mouth or nose? No Did you need to seek medical attention at a hospital or doctor's office? No When did it last happen?10+ years If all above answers are "NO", may proceed with cephalosporin use.   . Clindamycin/Lincomycin Hives and Itching  . Enalapril Rash and Cough    Cough  . Mobic [Meloxicam] Hives and Rash  . Morphine Hives, Nausea Only and Rash  . Brilinta  [Ticagrelor] Nausea And Vomiting  . Ciprofloxacin Itching and Rash  . Clopidogrel Rash  . Codeine Hives, Itching and Rash  . Doxycycline Rash  . Hydrocodone Rash  . Levofloxacin Hives and Itching  . Oxycodone Rash  . Rocephin [Ceftriaxone] Itching     Current Outpatient Medications:  .  acetaminophen (TYLENOL) 500 MG tablet, Take 1,000 mg by mouth every 6 (six) hours as needed (pain/headaches.). , Disp: , Rfl:  .  allopurinol (ZYLOPRIM) 100 MG tablet, TAKE 2 TABLETS BY MOUTH DAILY, Disp: 180 tablet, Rfl: 3 .  aspirin EC 81 MG tablet, Take 81 mg by mouth at bedtime., Disp: , Rfl:  .  atorvastatin (LIPITOR) 20 MG tablet, Take 20 mg by mouth daily., Disp: , Rfl:  .  B Complex-C-Folic Acid (DIALYVITE 932) 0.8 MG TABS, Take 1 tablet by mouth daily., Disp: , Rfl:  .  calcium acetate (PHOSLO) 667 MG capsule, Take 667-1,334 mg by mouth See admin instructions. Take 2 capsules (1334 mg) with each meal and 1 capsule (667 mg) with each snack, Disp: , Rfl:  .  cetirizine (ZYRTEC) 10 MG tablet, Take 10 mg by mouth daily., Disp: , Rfl:  .  diphenhydrAMINE (BENADRYL) 25 MG tablet, Take 50 mg by mouth at bedtime., Disp: , Rfl:  .  docusate sodium (COLACE) 100 MG capsule, Take 100 mg by mouth 2 (two) times daily. , Disp: , Rfl:  .  famotidine (PEPCID) 20 MG tablet, Take 20 mg by mouth daily as needed for heartburn or indigestion. , Disp: , Rfl:  .  fluticasone (FLONASE) 50 MCG/ACT nasal spray, Place 2 sprays into both nostrils daily as needed for allergies. , Disp: , Rfl:  .  gabapentin (NEURONTIN) 300 MG capsule, Take 300 mg by mouth at bedtime., Disp: , Rfl:  .  GLUCAGON EMERGENCY 1 MG injection, Inject 1 mg into the muscle daily as needed (low blood sugar). , Disp: , Rfl:  .  Glucose-Cholecalciferol (RELION GLUCOSE SHOT) LIQD, Take 1 Dose by mouth daily as needed (low blood sugar)., Disp: , Rfl:  .  hydrocortisone 2.5 % cream, Apply 1 application topically 2 (two) times daily as needed (rash / itching).,  Disp: , Rfl:  .  HYDROmorphone (DILAUDID) 2 MG tablet, Take 2 mg by mouth 2 (two) times daily as needed for moderate pain or severe pain. , Disp: , Rfl:  .  insulin lispro (HUMALOG) 100 UNIT/ML injection, Inject 75-120 Units into the skin continuous. Per insulin pump, Disp: , Rfl:  .  levothyroxine (SYNTHROID, LEVOTHROID) 112 MCG tablet, Take 112 mcg by mouth at bedtime. , Disp: , Rfl:  .  loperamide (IMODIUM A-D) 2 MG tablet, Take 2-4 mg by mouth 4 (four)  times daily as needed for diarrhea or loose stools., Disp: , Rfl:  .  Melatonin 5 MG TABS, Take 5 mg by mouth at bedtime., Disp: , Rfl:  .  metoCLOPramide (REGLAN) 10 MG tablet, Take 10 mg by mouth 4 (four) times daily -  before meals and at bedtime. , Disp: , Rfl:  .  midodrine (PROAMATINE) 10 MG tablet, Take 1 tablet (10 mg total) by mouth 3 (three) times daily. Takes 1 tab in mornings of dialysis and takes 2 tabs 30 mins before starting dialysis (m,w,f) (Patient taking differently: Take 10-20 mg by mouth See admin instructions. Take 2 tablets (20 mg) 1 before dialysis treatment on Mon, Wed, and Fri & 1 tablet (10 mg) by mouth as needed for low blood pressure), Disp: , Rfl:  .  nitroGLYCERIN (NITROSTAT) 0.4 MG SL tablet, Place 0.4 mg under the tongue every 5 (five) minutes as needed for chest pain., Disp: , Rfl:  .  Polyethyl Glycol-Propyl Glycol (SYSTANE ULTRA PF OP), Place 1 drop into both eyes every 2 (two) hours as needed (dry eyes)., Disp: , Rfl:  .  Polyethyl Glycol-Propyl Glycol 0.4-0.3 % SOLN, Place 1 drop into both eyes 4 (four) times daily., Disp: , Rfl:  .  polyethylene glycol (MIRALAX / GLYCOLAX) packet, Take 17 g by mouth daily. (Patient taking differently: Take 17 g by mouth daily as needed for mild constipation.), Disp: 14 each, Rfl: 0 .  prasugrel (EFFIENT) 10 MG TABS tablet, Take 1 tablet (10 mg total) by mouth daily. Please keep upcoming appt in January 2022 before anymore refills. Thank you, Disp: 90 tablet, Rfl: 0 .  promethazine  (PHENERGAN) 25 MG tablet, Take 25 mg by mouth every 6 (six) hours as needed for nausea or vomiting., Disp: , Rfl:  .  rOPINIRole (REQUIP) 2 MG tablet, Take 2 mg by mouth at bedtime., Disp: , Rfl:  .  sevelamer carbonate (RENVELA) 800 MG tablet, Take 800 mg by mouth 3 (three) times daily with meals., Disp: , Rfl:  .  testosterone cypionate (DEPOTESTOSTERONE CYPIONATE) 200 MG/ML injection, Inject 200 mg into the muscle every 14 (fourteen) days. , Disp: , Rfl: 1  Objective: Patient sounds good on phone . No distress  Laboratory Data:  Recent Results (from the past 2160 hour(s))  Basic metabolic panel     Status: Abnormal   Collection Time: 05/08/20 11:28 AM  Result Value Ref Range   Sodium 133 (L) 135 - 145 mmol/L   Potassium 3.7 3.5 - 5.1 mmol/L   Chloride 89 (L) 98 - 111 mmol/L   CO2 29 22 - 32 mmol/L   Glucose, Bld 224 (H) 70 - 99 mg/dL    Comment: Glucose reference range applies only to samples taken after fasting for at least 8 hours.   BUN 13 6 - 20 mg/dL   Creatinine, Ser 4.84 (H) 0.61 - 1.24 mg/dL   Calcium 8.4 (L) 8.9 - 10.3 mg/dL   GFR, Estimated 13 (L) >60 mL/min    Comment: (NOTE) Calculated using the CKD-EPI Creatinine Equation (2021)    Anion gap 15 5 - 15    Comment: Performed at [facility] [address] [place], [place] [address]  CBC     Status: Abnormal   Collection Time: 05/08/20 11:28 AM  Result Value Ref Range   WBC 13.6 (H) 4.0 - 10.5 K/uL   RBC 3.29 (L) 4.22 - 5.81 MIL/uL   Hemoglobin 10.8 (L) 13.0 - 17.0 g/dL   HCT  34.5 (L) 39.0 - 52.0 %   MCV 104.9 (H) 80.0 - 100.0 fL   MCH 32.8 26.0 - 34.0 pg   MCHC 31.3 30.0 - 36.0 g/dL   RDW 14.6 11.5 - 15.5 %   Platelets 200 150 - 400 K/uL   nRBC 0.0 0.0 - 0.2 %    Comment: Performed at [facility], [place] [address], [place], [place] [address]  SARS CORONAVIRUS 2 (TAT 6-24 HRS) Nasopharyngeal Nasopharyngeal Swab     Status: Abnormal   Collection Time: 05/08/20 11:30 AM   Specimen: Nasopharyngeal  Swab  Result Value Ref Range   SARS Coronavirus 2 POSITIVE (A) NEGATIVE    Comment: (NOTE) SARS-CoV-2 target nucleic acids are DETECTED.  The SARS-CoV-2 RNA is generally detectable in upper and lower respiratory specimens during the acute phase of infection. Positive results are indicative of the presence of SARS-CoV-2 RNA. Clinical correlation with patient history and other diagnostic information is  necessary to determine patient infection status. Positive results do not rule out bacterial infection or co-infection with other viruses.  The expected result is Negative.  Fact Sheet for Patients: SugarRoll.be  Fact Sheet for Healthcare Providers: https://www.woods-mathews.com/  This test is not yet approved or cleared by the [place] FDA and  has been authorized for detection and/or diagnosis of SARS-CoV-2 by FDA under an Emergency Use Authorization (EUA). This EUA will remain  in effect (meaning this test can be used) for the duration of the COVID-19 declaration under Section 564(b)(1) of the Act, 21 U. S.C. section 360bbb-3(b)(1), unless the authorization is terminated or revoked sooner.   Performed at [facility], [place] [address], [place], [place] [address]   Lactic acid, plasma     Status: None   Collection Time: 05/08/20  4:28 PM  Result Value Ref Range   Lactic Acid, Venous 1.3 0.5 - 1.9 mmol/L    Comment: Performed at [facility] [address] [place], [place] [address]  D-dimer, quantitative     Status: Abnormal   Collection Time: 05/08/20  4:28 PM  Result Value Ref Range   D-Dimer, Quant 0.90 (H) 0.00 - 0.50 ug/mL-FEU    Comment: (NOTE) At the manufacturer cut-off value of 0.5 g/mL FEU, this assay has a negative predictive value of 95-100%.This assay is intended for use in conjunction with a clinical pretest probability (PTP) assessment model to exclude pulmonary embolism (PE) and deep venous  thrombosis (DVT) in outpatients suspected of PE or DVT. Results should be correlated with clinical presentation. Performed at [facility], [place] [address] [place], [place] [address]   Procalcitonin     Status: None   Collection Time: 05/08/20  4:28 PM  Result Value Ref Range   Procalcitonin 2.51 ng/mL    Comment:        Interpretation: PCT > 2 ng/mL: Systemic infection (sepsis) is likely, unless other causes are known. (NOTE)       Sepsis PCT Algorithm           Lower Respiratory Tract                                      Infection PCT Algorithm    ----------------------------     ----------------------------         PCT < 0.25 ng/mL  PCT < 0.10 ng/mL          Strongly encourage             Strongly discourage   discontinuation of antibiotics    initiation of antibiotics    ----------------------------     -----------------------------       PCT 0.25 - 0.50 ng/mL            PCT 0.10 - 0.25 ng/mL               OR       >80% decrease in PCT            Discourage initiation of                                            antibiotics      Encourage discontinuation           of antibiotics    ----------------------------     -----------------------------         PCT >= 0.50 ng/mL              PCT 0.26 - 0.50 ng/mL               AND       <80% decrease in PCT              Encourage initiation of                                             antibiotics       Encourage continuation           of antibiotics    ----------------------------     -----------------------------        PCT >= 0.50 ng/mL                  PCT > 0.50 ng/mL               AND         increase in PCT                  Strongly encourage                                      initiation of antibiotics    Strongly encourage escalation           of antibiotics                                     -----------------------------                                           PCT <= 0.25 ng/mL                                                  OR                                        >  80% decrease in PCT                                      Discontinue / Do not initiate                                             antibiotics  Performed at [facility], [place] [address] [place], [place] [address]   Lactate dehydrogenase     Status: None   Collection Time: 05/08/20  4:28 PM  Result Value Ref Range   LDH 162 98 - 192 U/L    Comment: Performed at [facility], [place] [address], [place], [place] [address]  Ferritin     Status: Abnormal   Collection Time: 05/08/20  4:28 PM  Result Value Ref Range   Ferritin 842 (H) 24 - 336 ng/mL    Comment: Performed at [facility], [place] [address], [place], [place] [address]  Triglycerides     Status: Abnormal   Collection Time: 05/08/20  4:28 PM  Result Value Ref Range   Triglycerides 155 (H) <150 mg/dL    Comment: Performed at [facility] [address] [place], [place] [address]  Fibrinogen     Status: Abnormal   Collection Time: 05/08/20  4:28 PM  Result Value Ref Range   Fibrinogen 763 (H) 210 - 475 mg/dL    Comment: Performed at [facility] [address] [place], [place] [address]  C-reactive protein     Status: Abnormal   Collection Time: 05/08/20  4:28 PM  Result Value Ref Range   CRP 22.1 (H) <1.0 mg/dL    Comment: Performed at [facility], [place] [address], [place], [place] [address]  Brain natriuretic peptide     Status: Abnormal   Collection Time: 05/08/20  4:28 PM  Result Value Ref Range   B Natriuretic Peptide 141.4 (H) 0.0 - 100.0 pg/mL    Comment: Performed at [facility] [address], [place], [place] [address]  Hepatic function panel     Status: Abnormal   Collection Time: 05/08/20  4:28 PM  Result Value Ref Range   Total Protein 8.0 6.5 - 8.1 g/dL   Albumin 3.3 (L) 3.5 - 5.0 g/dL   AST 20 15 - 41 U/L   ALT 14 0 - 44 U/L   Alkaline Phosphatase 68 38 - 126 U/L   Total Bilirubin  0.6 0.3 - 1.2 mg/dL   Bilirubin, Direct 0.2 0.0 - 0.2 mg/dL   Indirect Bilirubin 0.4 0.3 - 0.9 mg/dL    Comment: Performed at [facility] [address] [place], [place] [address]  CBG monitoring, ED     Status: None   Collection Time: 05/08/20  4:54 PM  Result Value Ref Range   Glucose-Capillary 98 70 - 99 mg/dL    Comment: Glucose reference range applies only to samples taken after fasting for at least 8 hours.     Assessment: 55 y.o. male with mild/moderate COVID 19 viral infection diagnosed on 1/13 at high risk for progression to severe COVID 19.  Plan:  This patient is a 55 y.o. male that meets the following criteria for Emergency Use Authorization of: Molnupiravir  1. Age >  18 yr 2. SARS-COV-2 positive test 3. Symptom onset < 5 days 4. Mild-to-moderate COVID disease with high risk for severe progression to hospitalization or death   I have spoken and communicated the following to the patient or parent/caregiver regarding: 1. Molnupiravir is an unapproved drug that is authorized for use under an [employer] production planner.  2. There are no adequate, approved, available products for the treatment of COVID-19 in adults who have mild-to-moderate COVID-19 and are at high risk for progressing to severe COVID-19, including hospitalization or death. 3. Other therapeutics are currently authorized. For additional information on all products authorized for treatment or prevention of COVID-19, please see TanEmporium.pl.  4. There are benefits and risks of taking this treatment as outlined in the "Fact Sheet for Patients and Caregivers."  5. "Fact Sheet for Patients and Caregivers" was reviewed with patient. A hard copy will be provided to patient from pharmacy prior to the patient receiving treatment. 6. Patients should continue to self-isolate and use infection control measures  (e.g., wear mask, isolate, social distance, avoid sharing personal items, clean and disinfect "high touch" surfaces, and frequent handwashing) according to CDC guidelines.  7. The patient or parent/caregiver has the option to accept or refuse treatment. 8. [place] has established a pregnancy surveillance program. 9. Females of childbearing potential should use a reliable method of contraception correctly and consistently, as applicable, for the duration of treatment and for 4 days after the last dose of Molnupiravir. 40. Males of reproductive potential who are sexually active with females of childbearing potential should use a reliable method of contraception correctly and consistently during treatment and for at least 3 months after the last dose. 11. Pregnancy status and risk was assessed. Patient verbalized understanding of precautions.   After reviewing above information with the patient, the patient agrees to receive molnupiravir.  Follow up instructions:    . Take prescription BID x 5 days as directed . Reach out to pharmacist for counseling on medication if desired . For concerns regarding further COVID symptoms please follow up with your PCP or urgent care . For urgent or life-threatening issues, seek care at your local emergency department  The patient was provided an opportunity to ask questions, and all were answered. The patient agreed with the plan and demonstrated an understanding of the instructions.   Script sent to [facility] and opted to pick up RX.  The patient was advised to call their PCP or seek an in-person evaluation if the symptoms worsen or if the condition fails to improve as anticipated.   I provided 20 minutes of non face-to-face telephone visit time during this encounter, and > 50% was spent counseling as documented under my assessment & plan.  [name], PA-C 05/09/2020 /9:10 AM

## 2020-05-09 NOTE — Progress Notes (Signed)
Diagnosis: COVID-19  Physician: Dr. Patrick Wright  Procedure: [facility] Med: Sotrovimab infusion - Provided patient with sotrovimab fact sheet for patients, parents, and caregivers prior to infusion.   Complications: No immediate complications noted  Discharge: Discharged home    

## 2020-05-09 NOTE — Progress Notes (Signed)
Patient reviewed Fact Sheet for Patients, Parents, and Caregivers for Emergency Use Authorization (EUA) of sotrovimab for the Treatment of Coronavirus. Patient also reviewed and is agreeable to the estimated cost of treatment. Patient is agreeable to proceed.   

## 2020-05-09 NOTE — Discharge Instructions (Signed)

## 2020-05-11 ENCOUNTER — Telehealth: Payer: Self-pay | Admitting: *Deleted

## 2020-05-11 ENCOUNTER — Other Ambulatory Visit: Payer: Self-pay

## 2020-05-11 ENCOUNTER — Encounter: Payer: Self-pay | Admitting: Physician Assistant

## 2020-05-11 ENCOUNTER — Telehealth (INDEPENDENT_AMBULATORY_CARE_PROVIDER_SITE_OTHER): Payer: Medicare Other | Admitting: Physician Assistant

## 2020-05-11 VITALS — BP 163/75 | HR 80 | Ht 68.0 in | Wt 274.0 lb

## 2020-05-11 DIAGNOSIS — I25110 Atherosclerotic heart disease of native coronary artery with unstable angina pectoris: Secondary | ICD-10-CM | POA: Diagnosis not present

## 2020-05-11 DIAGNOSIS — N2581 Secondary hyperparathyroidism of renal origin: Secondary | ICD-10-CM | POA: Diagnosis not present

## 2020-05-11 DIAGNOSIS — Z8616 Personal history of COVID-19: Secondary | ICD-10-CM

## 2020-05-11 DIAGNOSIS — I5032 Chronic diastolic (congestive) heart failure: Secondary | ICD-10-CM | POA: Diagnosis not present

## 2020-05-11 DIAGNOSIS — N186 End stage renal disease: Secondary | ICD-10-CM | POA: Diagnosis not present

## 2020-05-11 DIAGNOSIS — Z992 Dependence on renal dialysis: Secondary | ICD-10-CM

## 2020-05-11 DIAGNOSIS — I10 Essential (primary) hypertension: Secondary | ICD-10-CM | POA: Diagnosis not present

## 2020-05-11 DIAGNOSIS — D509 Iron deficiency anemia, unspecified: Secondary | ICD-10-CM | POA: Diagnosis not present

## 2020-05-11 DIAGNOSIS — E782 Mixed hyperlipidemia: Secondary | ICD-10-CM

## 2020-05-11 DIAGNOSIS — Z89512 Acquired absence of left leg below knee: Secondary | ICD-10-CM

## 2020-05-11 DIAGNOSIS — E1022 Type 1 diabetes mellitus with diabetic chronic kidney disease: Secondary | ICD-10-CM

## 2020-05-11 DIAGNOSIS — T82818A Embolism of vascular prosthetic devices, implants and grafts, initial encounter: Secondary | ICD-10-CM | POA: Diagnosis not present

## 2020-05-11 DIAGNOSIS — D631 Anemia in chronic kidney disease: Secondary | ICD-10-CM | POA: Diagnosis not present

## 2020-05-11 DIAGNOSIS — D689 Coagulation defect, unspecified: Secondary | ICD-10-CM | POA: Diagnosis not present

## 2020-05-11 NOTE — Patient Instructions (Signed)
Medication Instructions:  Your physician recommends that you continue on your current medications as directed. Please refer to the Current Medication list given to you today.  *If you need a refill on your cardiac medications before your next appointment, please call your pharmacy*  Lab Work: None ordered today  Testing/Procedures: None ordered today  Follow-Up: At [name], you and your health needs are our priority.  As part of our continuing mission to provide you with exceptional heart care, we have created designated Provider Care Teams.  These Care Teams include your primary Cardiologist (physician) and Advanced Practice Providers (APPs -  Physician Assistants and Nurse Practitioners) who all work together to provide you with the care you need, when you need it.  Your next appointment:   6 month(s)  The format for your next appointment:   In Person  Provider:   Lauree Chandler, MD

## 2020-05-11 NOTE — Telephone Encounter (Signed)
  Patient Consent for Virtual Visit         Drue Huizinga has provided verbal consent on 05/11/2020 for a virtual visit (video or telephone).   CONSENT FOR VIRTUAL VISIT FOR:  Dale [name]  By participating in this virtual visit I agree to the following:  I hereby voluntarily request, consent and authorize [facility] and its employed or contracted physicians, physician assistants, nurse practitioners or other licensed health care professionals (the Practitioner), to provide me with telemedicine health care services (the "Services") as deemed necessary by the treating Practitioner. I acknowledge and consent to receive the Services by the Practitioner via telemedicine. I understand that the telemedicine visit will involve communicating with the Practitioner through live audiovisual communication technology and the disclosure of certain medical information by electronic transmission. I acknowledge that I have been given the opportunity to request an in-person assessment or other available alternative prior to the telemedicine visit and am voluntarily participating in the telemedicine visit.  I understand that I have the right to withhold or withdraw my consent to the use of telemedicine in the course of my care at any time, without affecting my right to future care or treatment, and that the Practitioner or I may terminate the telemedicine visit at any time. I understand that I have the right to inspect all information obtained and/or recorded in the course of the telemedicine visit and may receive copies of available information for a reasonable fee.  I understand that some of the potential risks of receiving the Services via telemedicine include:  [name] Kitchen Delay or interruption in medical evaluation due to technological equipment failure or disruption; . Information transmitted may not be sufficient (e.g. poor resolution of images) to allow for appropriate medical decision making by the Practitioner;  and/or  . In rare instances, security protocols could fail, causing a breach of personal health information.  Furthermore, I acknowledge that it is my responsibility to provide information about my medical history, conditions and care that is complete and accurate to the best of my ability. I acknowledge that Practitioner's advice, recommendations, and/or decision may be based on factors not within their control, such as incomplete or inaccurate data provided by me or distortions of diagnostic images or specimens that may result from electronic transmissions. I understand that the practice of medicine is not an exact science and that Practitioner makes no warranties or guarantees regarding treatment outcomes. I acknowledge that a copy of this consent can be made available to me via my patient portal ([facility]), or I can request a printed copy by calling the office of [facility].    I understand that my insurance will be billed for this visit.   I have read or had this consent read to me. . I understand the contents of this consent, which adequately explains the benefits and risks of the Services being provided via telemedicine.  . I have been provided ample opportunity to ask questions regarding this consent and the Services and have had my questions answered to my satisfaction. . I give my informed consent for the services to be provided through the use of telemedicine in my medical care

## 2020-05-11 NOTE — Progress Notes (Signed)
Virtual Visit via Video Note   This visit type was conducted due to national recommendations for restrictions regarding the COVID-19 Pandemic (e.g. social distancing) in an effort to limit this patient's exposure and mitigate transmission in our community.  Due to his co-morbid illnesses, this patient is at least at moderate risk for complications without adequate follow up.  This format is felt to be most appropriate for this patient at this time.  All issues noted in this document were discussed and addressed.  A limited physical exam was performed with this format.  Please refer to the patient's chart for his consent to telehealth for [facility].       Date:  05/11/2020   ID:  Dale [name], DOB February 01, 1966, MRN [id number] The patient was identified using 2 identifiers.  Patient Location: Home Provider Location: Home Office  PCP:  Donney Dice, DO  Cardiologist:  Lauree Chandler, MD   Electrophysiologist:  None   Evaluation Performed:  Follow-Up Visit  Chief Complaint:  Follow-up (CAD, CHF)    Patient Profile: Dale [name] is a 55 y.o. male with:  Coronary artery disease   S/p POBA to mid RCA >> Rotablator atherectomy/DES to mid RCA in 2015  IV dye allergy  ?Plavix allergy; + nausea with ticagrelor >> has done well with prasugrel   Myoview 11/16: Low risk  S/p NSTEMI >> Myoview 9/17: + Anterolateral ischemia, intermediate risk  Cath 9/17: Mid Dx 95 > >s/p DES to Dx  Heart failure with preserved ejection fraction   Echo 7/18: EF 60-65, GR 1 DD  ESRD  S/p renal transplant; failed after admission with Salmonella sepsis in 2014  Hypotension >> takes midodrine on dialysis days   Diabetes mellitus   Hypertension  Sleep apnea  Anemia  Peripheral arterial disease   S/p L BKA  Fibromyalgia  Hypothyroidism  Hyperlipidemia  Prior CV Studies: Echocardiogram 11/01/2016 Moderate LVH, EF 60-65, no RWMA, GR 1 DD, trivial MR, mild TR, PASP  39  Echocardiogram 02/03/2016 Mild LVH, EF 55-60, no RWMA, GRII DD, mild LAE  Cardiac catheterization 01/01/2016 LM normal LAD proximal 30, mid 30; Dx mid 95 LCx normal RCA mid stent patent, distal 30; inferior limb of PDA mid 50 PCI: 2.25 x 15 mm Xience Alpine DES to the Dx  Echocardiogram 12/23/2015 EF 60-65, GRII DD  Myoview 12/29/2015 Apical anterolateral ischemia, prior apical anterolateral infarct, EF 64; intermediate risk  Carotid US 04/09/15 IMPRESSION: 1. No evidence of significant atherosclerotic plaque or stenosis. 2. Vertebral arteries are patent with normal antegrade flow. 3. Intermittently irregular cardiac rhythm.  Myoview 02/26/15 Overall Study Impression:  Myocardial perfusion is normal. The study is normal. This is a low risk study. LV cavity size is normal. The left ventricular ejection fraction is mildly decreased (45-54%). There is no prior study for comparison.  Echo 02/25/15 Mod LVH, EF 60-65%, no RWMA, Gr 2 DD, mild MR  Echo 09/21/14 Severe LVH, EF 55-60%, normal wall motion, grade 1 diastolic dysfunction, MAC, mild LAE  PCI 04/09/14 1. Unstable angina secondary to severe stenosis mid RCA 2. Successful rotablator atherectomy/PTCA/DES mid RCA   History of Present Illness:   Dale [name] was last seen in 01/2019 by Ermalinda Barrios, PA-C via telemedicine.     He was seen in the emergency room 05/08/2020 with dyspnea and was diagnosed with COVID-19.  Inflammatory markers were elevated.  Admission was initially recommended.  However, the patient preferred to go home due to the impending winter  storm.  He was seen by family medicine and the decision was made to discharge with plans for outpatient COVID-19 treatment.  He underwent sotrovimab monoclonal antibody infusion 05/09/2020.  He was also prescribed molnupiravir nucleoside analog to be taken at home for 5 days.  He is seen today for follow-up.  He feels better since having the mAb infusion and starting the  nucleoside analog.  He goes for dialysis this afternoon.  He usually goes at 5 am but has to wait b/c he is COVID +.  He is chronically short of breath.  He was in [name] in Dec 2021 with pneumonia.  He has had to use O2 since.  He has not had chest pain, syncope, edema.    Past Medical History:  Diagnosis Date  . (HFpEF) heart failure with preserved ejection fraction ([place]ale [name])    a. Echo 3/13:  EF 55-60%;  b. Echo (11/15):  Mild LVH, EF 60-65%, no RWMA, Gr 1 DD, MAC, mild LAE, normal RVF, mild RAE;  c.  Echo 5/16:  severe LVH, EF 55-60%, no RWMA, Gr 1 DD, MAC, mild LAE // Echo 7/18: EF 60-65, Gr 1 DD  . Anal fissure   . Anemia of chronic disease   . Arthritis    "hands, right knee" (03/19/2014) no cartlidge in right knee  . CAD (coronary artery disease)    a. Abnl nuc ->LHC (11/15):  mid to dist Dx 80%, mid RCA 99% (functional CTO), dist RCA 80% >> PCI: balloon angioplasty to mid RCA (could not deliver stent) - staged PCI Rotoblator atherectomy/DES to [facility]. b. NSTEMI 12/2015 s/p DES to diagonal.  . Cholecystitis    a. 08/27/2011  . Claustrophobia    when things get around his face.   . Complication of anesthesia   . Difficult intubation    only once in 2015 at [facility] haven't had a problem since then  . Diverticulosis   . Esophageal dysmotility   . ESRD (end stage renal disease) ([place])    a. 1995 s/p cadaveric transplant w/ susbequent failure after 18 yrs;  b.Dialysis initiated 07/2011 Dale [name] M-W-F  . Fibromyalgia   . Gastroparesis   . GERD (gastroesophageal reflux disease)   . Gout    PMH  . History of blood transfusion   . History of COVID-19 04/2020   tx with sotrovimab (mAb) and molnupiravir (nucleoside analog)  . History of kidney stones    early 1990's  . HLD (hyperlipidemia)   . Hypertension   . Hypothyroidism   . Inguinal lymphadenopathy    a. bilateral - s/p biopsy 07/2011  . Insulin dependent diabetes mellitus    Type I  . Monilial esophagitis ([place] [name])   .  Morbid obesity ([place] [name])   . OSA (obstructive sleep apnea)    adjustable bed and uses O2 L at bedtime  . PAD (peripheral artery disease) (HCC)    a. s/p L BKA.  [name] Kitchen Pericardial effusion    a.  Small by CT 08/22/11;  b.  Large by CT 08/27/11. c. Not seen on 11/2015 echo.  . Pneumonia ~ 2007  . PONV (postoperative nausea and vomiting)    vomited once after a procedure.  . T1DM (type 1 diabetes mellitus) ([place])   . Tracheobronchomalacia    Past Surgical History:  Procedure Laterality Date  . A/V FISTULAGRAM Left 07/26/2016   Procedure: A/V Fistulagram;  Surgeon: Waynetta Sandy, MD;  Location: [place] CV LAB;  Service: Cardiovascular;  Laterality: Left;  .  A/V FISTULAGRAM N/A 07/11/2017   Procedure: A/V FISTULAGRAM - Left AV;  Surgeon: Waynetta Sandy, MD;  Location: [place] CV LAB;  Service: Cardiovascular;  Laterality: N/A;  . AMPUTATION OF REPLICATED TOES Right   . ANGIOPLASTY  04/09/2014   RCA   . APPENDECTOMY  ~ 2006  . ARTERIOVENOUS GRAFT PLACEMENT Left 1993?   forearm  . ARTERIOVENOUS GRAFT PLACEMENT Right 1993?   leg  . ARTERIOVENOUS GRAFT PLACEMENT Left    Thigh  . Arteriovenous Graft Removed Left    Thigh  . AV FISTULA PLACEMENT  07/29/2011   Procedure: ARTERIOVENOUS (AV) FISTULA CREATION;  Surgeon: Mal Misty, MD;  Location: [place];  Service: Vascular;  Laterality: Right;  Brachial cephalic  . AV FISTULA PLACEMENT Left 01/20/2015   Procedure: LIGATION OF RIGHT ARTERIOVENOUS FISTULA WITH EXCISION OF ANEURYSM;  Surgeon: Angelia Mould, MD;  Location: [place];  Service: Vascular;  Laterality: Left;  . AV FISTULA PLACEMENT Left 04/30/2015   Procedure: CREATION OF LEFT ARM BRACHIO-CEPHALIC ARTERIOVENOUS (AV) FISTULA ;  Surgeon: Angelia Mould, MD;  Location: [place];  Service: Vascular;  Laterality: Left;  . AV Graft PLACEMENT Left 1993?   "attempted one in my wrist; didn't take"  . BASCILIC VEIN TRANSPOSITION Left 11/01/2018   Procedure: FIRST STAGE  BASILIC VEIN TRANSPOSITION  LEFT ARM;  Surgeon: Serafina Mitchell, MD;  Location: [place];  Service: Vascular;  Laterality: Left;  . BASCILIC VEIN TRANSPOSITION Left 12/27/2018   Procedure: SECOND STAGE BASILIC VEIN TRANSPOSITION  LEFT ARM;  Surgeon: Serafina Mitchell, MD;  Location: [place];  Service: Vascular;  Laterality: Left;  . BELOW KNEE LEG AMPUTATION Left 2010  . CARDIAC CATHETERIZATION  03/19/2014  . CARDIAC CATHETERIZATION  03/19/2014   Procedure: CORONARY BALLOON ANGIOPLASTY;  Surgeon: Burnell Blanks, MD;  Location: [facility] CATH LAB;  Service: Cardiovascular;;  . CARDIAC CATHETERIZATION N/A 01/01/2016   Procedure: Left Heart Cath and Coronary Angiography;  Surgeon: Troy Sine, MD;  Location: [place] CV LAB;  Service: Cardiovascular;  Laterality: N/A;  . CARDIAC CATHETERIZATION N/A 01/01/2016   Procedure: Coronary Stent Intervention;  Surgeon: Troy Sine, MD;  Location: [place] CV LAB;  Service: Cardiovascular;  Laterality: N/A;  . CARPAL TUNNEL RELEASE Bilateral after 2005  . CATARACT EXTRACTION W/ INTRAOCULAR LENS  IMPLANT, BILATERAL Bilateral 1990's  . COLONOSCOPY  08/29/2011   Procedure: COLONOSCOPY;  Surgeon: Jerene Bears, MD;  Location: [place];  Service: Gastroenterology;  Laterality: N/A;  . COLONOSCOPY WITH PROPOFOL N/A 04/26/2016   Procedure: COLONOSCOPY WITH PROPOFOL;  Surgeon: Jerene Bears, MD;  Location: [facility];  Service: Gastroenterology;  Laterality: N/A;  . CORONARY ANGIOPLASTY WITH STENT PLACEMENT  04/09/2014       ptca/des mid lad   . ESOPHAGOGASTRODUODENOSCOPY N/A 12/25/2015   Procedure: ESOPHAGOGASTRODUODENOSCOPY (EGD);  Surgeon: Mauri Pole, MD;  Location: [facility] ENDOSCOPY;  Service: Endoscopy;  Laterality: N/A;  bedside  . ESOPHAGOGASTRODUODENOSCOPY (EGD) WITH PROPOFOL N/A 04/26/2016   Procedure: ESOPHAGOGASTRODUODENOSCOPY (EGD) WITH PROPOFOL;  Surgeon: Jerene Bears, MD;  Location: [facility];  Service: Gastroenterology;  Laterality: N/A;  . EYE  SURGERY Bilateral    cataract  . FEMORAL ARTERY REPAIR  04/09/2014   ANGIOSEAL  . INSERTION OF DIALYSIS CATHETER  08/01/2011   Procedure: INSERTION OF DIALYSIS CATHETER;  Surgeon: Rosetta Posner, MD;  Location: [place];  Service: Vascular;  Laterality: Right;  insertion of dialysis catheter on right internal jugular vein  . INSERTION  OF DIALYSIS CATHETER Right 01/20/2015   Procedure: INSERTION OF DIALYSIS CATHETER - RIGHT INTERNAL JUGULAR ;  Surgeon: Angelia Mould, MD;  Location: [place];  Service: Vascular;  Laterality: Right;  . INSERTION OF DIALYSIS CATHETER Right 08/02/2018   Procedure: Insertion Of Dialysis Catheter;  Surgeon: Angelia Mould, MD;  Location: [name];  Service: Vascular;  Laterality: Right;  . KIDNEY TRANSPLANT  04/25/1993  . LEFT HEART CATHETERIZATION WITH CORONARY ANGIOGRAM N/A 03/19/2014   Procedure: LEFT HEART CATHETERIZATION WITH CORONARY ANGIOGRAM;  Surgeon: Burnell Blanks, MD;  Location: [facility] CATH LAB;  Service: Cardiovascular;  Laterality: N/A;  . LIGATION OF ARTERIOVENOUS  FISTULA Left 11/01/2018   Procedure: Ligation Of  Brachio Cephalic Arteriovenous  Fistula;  Surgeon: Serafina Mitchell, MD;  Location: [facility] OR;  Service: Vascular;  Laterality: Left;  . LIGATION OF ARTERIOVENOUS  FISTULA Left 02/19/2019   Procedure: LIGATION OF ARTERIOVENOUS  FISTULA LEFT ARM;  Surgeon: Angelia Mould, MD;  Location: [place];  Service: Vascular;  Laterality: Left;  . LYMPH NODE BIOPSY  08/10/2011   Procedure: LYMPH NODE BIOPSY;  Surgeon: Merrie Roof, MD;  Location: [place];  Service: General;  Laterality: Left;  left groin lymph biopsy  . NEUROPLASTY / TRANSPOSITION ULNAR NERVE AT ELBOW Left after 2005  . PATCH ANGIOPLASTY Left 08/09/2016   Procedure: LEFT  ARTERIOVENOUS FISTULA PATCH ANGIOPLASTY;  Surgeon: Waynetta Sandy, MD;  Location: [place];  Service: Vascular;  Laterality: Left;  . PERCUTANEOUS CORONARY ROTOBLATOR INTERVENTION (PCI-R) N/A 04/09/2014    Procedure: PERCUTANEOUS CORONARY ROTOBLATOR INTERVENTION (PCI-R);  Surgeon: Burnell Blanks, MD;  Location: [facility] CATH LAB;  Service: Cardiovascular;  Laterality: N/A;  . PERIPHERAL VASCULAR BALLOON ANGIOPLASTY  07/11/2017   Procedure: PERIPHERAL VASCULAR BALLOON ANGIOPLASTY;  Surgeon: Waynetta Sandy, MD;  Location: [place] CV LAB;  Service: Cardiovascular;;  left arm AV fistula  . PERIPHERAL VASCULAR CATHETERIZATION N/A 12/25/2014   Procedure: Upper Extremity Venography;  Surgeon: Conrad , MD;  Location: [place] CV LAB;  Service: Cardiovascular;  Laterality: N/A;  . PERIPHERAL VASCULAR CATHETERIZATION Left 02/24/2016   Procedure: Fistulagram;  Surgeon: Waynetta Sandy, MD;  Location: [place] CV LAB;  Service: Cardiovascular;  Laterality: Left;  . PERIPHERAL VASCULAR CATHETERIZATION Left 02/24/2016   Procedure: Peripheral Vascular Intervention;  Surgeon: Waynetta Sandy, MD;  Location: [place] CV LAB;  Service: Cardiovascular;  Laterality: Left;  AV FISTULA  . PERITONEAL CATHETER INSERTION    . PERITONEAL CATHETER REMOVAL    . REVISON OF ARTERIOVENOUS FISTULA Right 7/67/3419   Procedure: PLICATION / REVISION OF ARTERIOVENOUS FISTULA;  Surgeon: Angelia Mould, MD;  Location: [place];  Service: Vascular;  Laterality: Right;  . REVISON OF ARTERIOVENOUS FISTULA Left 09/01/2015   Procedure: SUPERFICIALIZATION OF LEFT ARM BRACHIOCEPHALIC ARTERIOVENOUS FISTULA;  Surgeon: Angelia Mould, MD;  Location: [place];  Service: Vascular;  Laterality: Left;  . REVISON OF ARTERIOVENOUS FISTULA Left 08/09/2016   Procedure: REVISION OF ARTERIAL ANASTOMOSIS OF ARTERIOVENOUS FISTULA;  Surgeon: Waynetta Sandy, MD;  Location: [place];  Service: Vascular;  Laterality: Left;  . SHOULDER ARTHROSCOPY Left ~ 2006   frozen  . THROMBECTOMY W/ EMBOLECTOMY Left 08/02/2018   Procedure: THROMBECTOMY ARTERIOVENOUS FISTULA;  Surgeon: Angelia Mould, MD;   Location: [place];  Service: Vascular;  Laterality: Left;  . TOE AMPUTATION Bilateral after 2005  . UMBILICAL HERNIA REPAIR  1990's  . UPPER EXTREMITY VENOGRAPHY Bilateral 10/16/2018   Procedure: UPPER EXTREMITY  VENOGRAPHY;  Surgeon: Serafina Mitchell, MD;  Location: [place] CV LAB;  Service: Cardiovascular;  Laterality: Bilateral;  . VENOGRAM Right 07/28/2011   Procedure: VENOGRAM;  Surgeon: Conrad , MD;  Location: [facility] CATH LAB;  Service: Cardiovascular;  Laterality: Right;  . VITRECTOMY Bilateral      Current Meds  Medication Sig  . acetaminophen (TYLENOL) 500 MG tablet Take 1,000 mg by mouth every 6 (six) hours as needed (pain/headaches.).   [name] Kitchen allopurinol (ZYLOPRIM) 100 MG tablet TAKE 2 TABLETS BY MOUTH DAILY  . aspirin EC 81 MG tablet Take 81 mg by mouth at bedtime.  [name] Kitchen atorvastatin (LIPITOR) 20 MG tablet Take 20 mg by mouth daily.  . B Complex-C-Folic Acid (DIALYVITE 275) 0.8 MG TABS Take 1 tablet by mouth daily.  . calcium acetate (PHOSLO) 667 MG capsule Take by mouth See admin instructions. Take 3 Capsules by mouth with each meal daily  . cetirizine (ZYRTEC) 10 MG tablet Take 10 mg by mouth daily.  . diphenhydrAMINE (BENADRYL) 25 MG tablet Take 50 mg by mouth at bedtime.  . docusate sodium (COLACE) 100 MG capsule Take 100 mg by mouth 2 (two) times daily.   . famotidine (PEPCID) 20 MG tablet Take 20 mg by mouth 2 (two) times daily.  . fluticasone (FLONASE) 50 MCG/ACT nasal spray Place 2 sprays into both nostrils daily as needed for allergies.   [name] Kitchen gabapentin (NEURONTIN) 300 MG capsule Take 300 mg by mouth 2 (two) times daily.  [name] Kitchen GLUCAGON EMERGENCY 1 MG injection Inject 1 mg into the muscle daily as needed (low blood sugar).   . Glucose-Cholecalciferol (RELION GLUCOSE SHOT) LIQD Take 1 Dose by mouth daily as needed (low blood sugar).  . hydrocortisone 2.5 % cream Apply 1 application topically 2 (two) times daily as needed (rash / itching).  [name] Kitchen HYDROmorphone (DILAUDID) 2 MG tablet Take  2 mg by mouth 2 (two) times daily as needed for moderate pain or severe pain.   [name] Kitchen insulin lispro (HUMALOG) 100 UNIT/ML injection Inject 75-120 Units into the skin continuous. Per insulin pump  . levothyroxine (SYNTHROID, LEVOTHROID) 112 MCG tablet Take 112 mcg by mouth at bedtime.   [name] Kitchen loperamide (IMODIUM A-D) 2 MG tablet Take 2-4 mg by mouth 4 (four) times daily as needed for diarrhea or loose stools.  . Melatonin 5 MG TABS Take 5 mg by mouth at bedtime.  . midodrine (PROAMATINE) 10 MG tablet Take 1 tablet (10 mg total) by mouth 3 (three) times daily. Takes 1 tab in mornings of dialysis and takes 2 tabs 30 mins before starting dialysis (m,w,f)  . molnupiravir EUA 200 mg CAPS Take 4 capsules (800 mg total) by mouth 2 (two) times daily for 5 days.  . nitroGLYCERIN (NITROSTAT) 0.4 MG SL tablet Place 0.4 mg under the tongue every 5 (five) minutes as needed for chest pain.  Vladimir Faster Glycol-Propyl Glycol (SYSTANE ULTRA PF OP) Place 1 drop into both eyes every 2 (two) hours as needed (dry eyes).  Vladimir Faster Glycol-Propyl Glycol 0.4-0.3 % SOLN Place 1 drop into both eyes 4 (four) times daily.  . polyethylene glycol (MIRALAX / GLYCOLAX) packet Take 17 g by mouth daily.  . prasugrel (EFFIENT) 10 MG TABS tablet Take 1 tablet (10 mg total) by mouth daily. Please keep upcoming appt in January 2022 before anymore refills. Thank you  . promethazine (PHENERGAN) 25 MG tablet Take 25 mg by mouth every 6 (six) hours as needed for nausea or vomiting.  [name] Kitchen rOPINIRole (REQUIP)  2 MG tablet Take 2 mg by mouth at bedtime.  . sevelamer carbonate (RENVELA) 800 MG tablet Take 800 mg by mouth 3 (three) times daily with meals.  [name] Kitchen testosterone cypionate (DEPOTESTOSTERONE CYPIONATE) 200 MG/ML injection Inject 200 mg into the muscle every 14 (fourteen) days.      Allergies:   Iodinated diagnostic agents, Prednisone, Adhesive [tape], Amoxicillin, Clindamycin/lincomycin, Enalapril, Mobic [meloxicam], Morphine, Brilinta  [ticagrelor], Ciprofloxacin, Clopidogrel, Codeine, Doxycycline, Hydrocodone, Levofloxacin, Oxycodone, and Rocephin [ceftriaxone]   Social History   Tobacco Use  . Smoking status: Never Smoker  . Smokeless tobacco: Never Used  Vaping Use  . Vaping Use: Never used  Substance Use Topics  . Alcohol use: Yes    Comment: rare  . Drug use: No     Family Hx: [name] family history includes Bone cancer in his maternal aunt; Breast cancer in his maternal aunt; Colon polyps in his father; Diabetes in his father and mother; Hyperlipidemia in his mother; Hypertension in his father and mother; Liver cancer in his maternal aunt. There is no history of Malignant hyperthermia, Heart attack, Stroke, Colon cancer, Stomach cancer, Pancreatic cancer, or Esophageal cancer.  ROS:   Please see the history of present illness.      Labs/Other Tests and Data Reviewed:    EKG:  An ECG dated 05/08/20 was personally reviewed today and demonstrated:  NSR, HR 100, normal axis, non specific ST-TW changes, QTc 438, no changes.   Recent Labs: 05/08/2020: ALT 14; B Natriuretic Peptide 141.4; BUN 13; Creatinine, Ser 4.84; Hemoglobin 10.8; Platelets 200; Potassium 3.7; Sodium 133   Recent Lipid Panel Lab Results  Component Value Date/Time   CHOL 143 02/25/2015 02:00 PM   TRIG 155 (H) 05/08/2020 04:28 PM   HDL 29 (L) 02/25/2015 02:00 PM   CHOLHDL 4.9 02/25/2015 02:00 PM   LDLCALC 63 02/25/2015 02:00 PM    Wt Readings from Last 3 Encounters:  05/11/20 274 lb (124.3 kg)  04/16/20 288 lb (130.6 kg)  02/05/19 279 lb (126.6 kg)     Risk Assessment/Calculations:      Objective:    Vital Signs:  BP (!) 163/75   Pulse 80   Ht _0  (1.727 m)   Wt 274 lb (124.3 kg)   SpO2 98%   BMI 41.66 kg/m    VITAL SIGNS:  reviewed GEN:  no acute distress EYES:  sclerae anicteric  RESPIRATORY:  O2 nasal canula in place, normal respiratory effort PSYCH:  normal affect  ASSESSMENT & PLAN:    1. Coronary artery  disease involving native coronary artery of native heart with unstable angina pectoris ([place]) S/p DES to the RCA in 2015 and subsequent non-STEMI in 2017 treated with a DES to the diagonal.  He is doing well without anginal symptoms.  He remains on dual antiplatelet therapy with aspirin and prasugrel.  Continue statin therapy.  2. Chronic heart failure with preserved ejection fraction (HCC) Volume management per dialysis.  He is on chronic O2 now since his admission to the hospital in December with pneumonia.  3. Essential hypertension Blood pressure usually runs low.  It is running higher today because he is due for dialysis.  4. Mixed hyperlipidemia Continue statin therapy.  I have asked him to get his endocrinologist to send [place] a copy of his next lipid panel.  LDL goal is <70.  If he remains above 70, consider increasing atorvastatin to 40 mg daily  5. ESRD on dialysis [facility]) He remains on Monday, Wednesday, Friday  dialysis.  6. Type 1 diabetes mellitus with chronic kidney disease on chronic dialysis [facility]) This is managed by endocrinology.  7. Status post below-knee amputation of left lower extremity ([place]) He is typically in a wheelchair.  8. History of COVID-19 As noted, he is s/p infusion with monoclonal antibody and is currently on nucleoside analog.  He feels his symptoms are improving.    Time:   Today, I have spent 12 minutes with the patient with telehealth technology discussing the above problems.     Medication Adjustments/Labs and Tests Ordered: Current medicines are reviewed at length with the patient today.  Concerns regarding medicines are outlined above.   Tests Ordered: No orders of the defined types were placed in this encounter.   Medication Changes: No orders of the defined types were placed in this encounter.   Follow Up:  In Person in 6 month(s)  Signed, Richardson Dopp, PA-C  05/11/2020 5:05 PM    [facility]

## 2020-05-13 DIAGNOSIS — D631 Anemia in chronic kidney disease: Secondary | ICD-10-CM | POA: Diagnosis not present

## 2020-05-13 DIAGNOSIS — T82818A Embolism of vascular prosthetic devices, implants and grafts, initial encounter: Secondary | ICD-10-CM | POA: Diagnosis not present

## 2020-05-13 DIAGNOSIS — N186 End stage renal disease: Secondary | ICD-10-CM | POA: Diagnosis not present

## 2020-05-13 DIAGNOSIS — D689 Coagulation defect, unspecified: Secondary | ICD-10-CM | POA: Diagnosis not present

## 2020-05-13 DIAGNOSIS — Z992 Dependence on renal dialysis: Secondary | ICD-10-CM | POA: Diagnosis not present

## 2020-05-13 DIAGNOSIS — D509 Iron deficiency anemia, unspecified: Secondary | ICD-10-CM | POA: Diagnosis not present

## 2020-05-13 DIAGNOSIS — N2581 Secondary hyperparathyroidism of renal origin: Secondary | ICD-10-CM | POA: Diagnosis not present

## 2020-05-14 DIAGNOSIS — N2581 Secondary hyperparathyroidism of renal origin: Secondary | ICD-10-CM | POA: Diagnosis not present

## 2020-05-14 DIAGNOSIS — Z992 Dependence on renal dialysis: Secondary | ICD-10-CM | POA: Diagnosis not present

## 2020-05-14 DIAGNOSIS — D689 Coagulation defect, unspecified: Secondary | ICD-10-CM | POA: Diagnosis not present

## 2020-05-14 DIAGNOSIS — N186 End stage renal disease: Secondary | ICD-10-CM | POA: Diagnosis not present

## 2020-05-14 DIAGNOSIS — D631 Anemia in chronic kidney disease: Secondary | ICD-10-CM | POA: Diagnosis not present

## 2020-05-14 DIAGNOSIS — T82818A Embolism of vascular prosthetic devices, implants and grafts, initial encounter: Secondary | ICD-10-CM | POA: Diagnosis not present

## 2020-05-14 DIAGNOSIS — D509 Iron deficiency anemia, unspecified: Secondary | ICD-10-CM | POA: Diagnosis not present

## 2020-05-18 DIAGNOSIS — Z992 Dependence on renal dialysis: Secondary | ICD-10-CM | POA: Diagnosis not present

## 2020-05-18 DIAGNOSIS — N186 End stage renal disease: Secondary | ICD-10-CM | POA: Diagnosis not present

## 2020-05-18 DIAGNOSIS — D509 Iron deficiency anemia, unspecified: Secondary | ICD-10-CM | POA: Diagnosis not present

## 2020-05-18 DIAGNOSIS — D631 Anemia in chronic kidney disease: Secondary | ICD-10-CM | POA: Diagnosis not present

## 2020-05-18 DIAGNOSIS — N2581 Secondary hyperparathyroidism of renal origin: Secondary | ICD-10-CM | POA: Diagnosis not present

## 2020-05-18 DIAGNOSIS — D689 Coagulation defect, unspecified: Secondary | ICD-10-CM | POA: Diagnosis not present

## 2020-05-18 DIAGNOSIS — T82818A Embolism of vascular prosthetic devices, implants and grafts, initial encounter: Secondary | ICD-10-CM | POA: Diagnosis not present

## 2020-05-19 ENCOUNTER — Telehealth: Payer: Self-pay | Admitting: Internal Medicine

## 2020-05-19 NOTE — Telephone Encounter (Signed)
Called and spoke to pt. Pt states he feels he has needed more oxygen lately. Pt states he normally wears 2lpm 24/7 but has increased it to 3.5lpm at times. When questioning pt it seems he is going on RA a bit and then putting O2 on after he becomes SOB and having chest tightness. When I spoke to the pt he was on RA and was saturating at 84%. Advised pt to put on 2lpm and after waiting several minutes pt's spo2 increased to 96%. Pt aware to keep 2lpm on even at rest and to monitor his levels when he is active. Pt was diagnosed with COVID when he went to the ED on 05/08/20 and was given the oral antiviral and antibody infusion. Pt states he still feels run down but doesn't feel he is getting worse. Pt states still has increase in SOB when active and cough. While speaking to pt on the phone he sounded well and not in distress, pt was able to complete full sentences without obvious dyspnea. Pt went to his scheduled dialysis on 1.24.22. Advised pt that if he were to have any sudden worsening or new s/s then he needs to go back to the ED. Pt verbalized understanding. Pt has an appt with MW on 05/26/20.   Will forward to Dr. Melvyn Novas as Jul[place].

## 2020-05-20 ENCOUNTER — Encounter (HOSPITAL_COMMUNITY): Payer: Self-pay | Admitting: Emergency Medicine

## 2020-05-20 ENCOUNTER — Observation Stay (HOSPITAL_COMMUNITY)
Admission: EM | Admit: 2020-05-20 | Discharge: 2020-05-21 | Disposition: A | Discharge status: Home / Self Care | Payer: Medicare Other | Attending: Family Medicine | Admitting: Family Medicine

## 2020-05-20 ENCOUNTER — Emergency Department (HOSPITAL_COMMUNITY): Payer: Medicare Other

## 2020-05-20 DIAGNOSIS — E1022 Type 1 diabetes mellitus with diabetic chronic kidney disease: Secondary | ICD-10-CM | POA: Insufficient documentation

## 2020-05-20 DIAGNOSIS — Z79899 Other long term (current) drug therapy: Secondary | ICD-10-CM | POA: Insufficient documentation

## 2020-05-20 DIAGNOSIS — Z794 Long term (current) use of insulin: Secondary | ICD-10-CM | POA: Diagnosis not present

## 2020-05-20 DIAGNOSIS — J1282 Pneumonia due to coronavirus disease 2019: Secondary | ICD-10-CM | POA: Insufficient documentation

## 2020-05-20 DIAGNOSIS — Z89512 Acquired absence of left leg below knee: Secondary | ICD-10-CM | POA: Insufficient documentation

## 2020-05-20 DIAGNOSIS — E039 Hypothyroidism, unspecified: Secondary | ICD-10-CM | POA: Insufficient documentation

## 2020-05-20 DIAGNOSIS — J9601 Acute respiratory failure with hypoxia: Secondary | ICD-10-CM | POA: Diagnosis not present

## 2020-05-20 DIAGNOSIS — U071 COVID-19: Secondary | ICD-10-CM | POA: Diagnosis not present

## 2020-05-20 DIAGNOSIS — E109 Type 1 diabetes mellitus without complications: Secondary | ICD-10-CM | POA: Diagnosis not present

## 2020-05-20 DIAGNOSIS — G4733 Obstructive sleep apnea (adult) (pediatric): Secondary | ICD-10-CM | POA: Insufficient documentation

## 2020-05-20 DIAGNOSIS — Z992 Dependence on renal dialysis: Secondary | ICD-10-CM

## 2020-05-20 DIAGNOSIS — I739 Peripheral vascular disease, unspecified: Secondary | ICD-10-CM | POA: Insufficient documentation

## 2020-05-20 DIAGNOSIS — J90 Pleural effusion, not elsewhere classified: Secondary | ICD-10-CM | POA: Diagnosis not present

## 2020-05-20 DIAGNOSIS — R0602 Shortness of breath: Secondary | ICD-10-CM | POA: Diagnosis not present

## 2020-05-20 DIAGNOSIS — E785 Hyperlipidemia, unspecified: Secondary | ICD-10-CM | POA: Insufficient documentation

## 2020-05-20 DIAGNOSIS — J9621 Acute and chronic respiratory failure with hypoxia: Secondary | ICD-10-CM | POA: Insufficient documentation

## 2020-05-20 DIAGNOSIS — N186 End stage renal disease: Secondary | ICD-10-CM | POA: Insufficient documentation

## 2020-05-20 DIAGNOSIS — I120 Hypertensive chronic kidney disease with stage 5 chronic kidney disease or end stage renal disease: Secondary | ICD-10-CM | POA: Diagnosis not present

## 2020-05-20 LAB — PROCALCITONIN: Procalcitonin: 1.24 ng/mL

## 2020-05-20 LAB — CBC WITH DIFFERENTIAL/PLATELET
Abs Immature Granulocytes: 0.03 10*3/uL (ref 0.00–0.07)
Basophils Absolute: 0.1 10*3/uL (ref 0.0–0.1)
Basophils Relative: 1 %
Eosinophils Absolute: 0.7 10*3/uL — ABNORMAL HIGH (ref 0.0–0.5)
Eosinophils Relative: 9 %
HCT: 29.9 % — ABNORMAL LOW (ref 39.0–52.0)
Hemoglobin: 8.8 g/dL — ABNORMAL LOW (ref 13.0–17.0)
Immature Granulocytes: 0 %
Lymphocytes Relative: 15 %
Lymphs Abs: 1.1 10*3/uL (ref 0.7–4.0)
MCH: 31.2 pg (ref 26.0–34.0)
MCHC: 29.4 g/dL — ABNORMAL LOW (ref 30.0–36.0)
MCV: 106 fL — ABNORMAL HIGH (ref 80.0–100.0)
Monocytes Absolute: 0.4 10*3/uL (ref 0.1–1.0)
Monocytes Relative: 6 %
Neutro Abs: 5.1 10*3/uL (ref 1.7–7.7)
Neutrophils Relative %: 69 %
Platelets: 293 10*3/uL (ref 150–400)
RBC: 2.82 MIL/uL — ABNORMAL LOW (ref 4.22–5.81)
RDW: 13.7 % (ref 11.5–15.5)
WBC: 7.4 10*3/uL (ref 4.0–10.5)
nRBC: 0 % (ref 0.0–0.2)

## 2020-05-20 LAB — C-REACTIVE PROTEIN: CRP: 12.9 mg/dL — ABNORMAL HIGH (ref <1.0)

## 2020-05-20 LAB — D-DIMER, QUANTITATIVE: D-Dimer, Quant: 0.87 ug/mL-FEU — ABNORMAL HIGH (ref 0.00–0.50)

## 2020-05-20 LAB — LACTIC ACID, PLASMA
Lactic Acid, Venous: 0.8 mmol/L (ref 0.5–1.9)
Lactic Acid, Venous: 0.8 mmol/L (ref 0.5–1.9)

## 2020-05-20 LAB — COMPREHENSIVE METABOLIC PANEL
ALT: 14 U/L (ref 0–44)
AST: 12 U/L — ABNORMAL LOW (ref 15–41)
Albumin: 3 g/dL — ABNORMAL LOW (ref 3.5–5.0)
Alkaline Phosphatase: 77 U/L (ref 38–126)
Anion gap: 12 (ref 5–15)
BUN: 40 mg/dL — ABNORMAL HIGH (ref 6–20)
CO2: 29 mmol/L (ref 22–32)
Calcium: 8.1 mg/dL — ABNORMAL LOW (ref 8.9–10.3)
Chloride: 92 mmol/L — ABNORMAL LOW (ref 98–111)
Creatinine, Ser: 9.56 mg/dL — ABNORMAL HIGH (ref 0.61–1.24)
GFR, Estimated: 6 mL/min — ABNORMAL LOW (ref >60)
Glucose, Bld: 212 mg/dL — ABNORMAL HIGH (ref 70–99)
Potassium: 4.5 mmol/L (ref 3.5–5.1)
Sodium: 133 mmol/L — ABNORMAL LOW (ref 135–145)
Total Bilirubin: 0.3 mg/dL (ref 0.3–1.2)
Total Protein: 7.5 g/dL (ref 6.5–8.1)

## 2020-05-20 LAB — FERRITIN: Ferritin: 1030 ng/mL — ABNORMAL HIGH (ref 24–336)

## 2020-05-20 LAB — LACTATE DEHYDROGENASE: LDH: 99 U/L (ref 98–192)

## 2020-05-20 LAB — TRIGLYCERIDES: Triglycerides: 107 mg/dL (ref <150)

## 2020-05-20 LAB — FIBRINOGEN: Fibrinogen: 670 mg/dL — ABNORMAL HIGH (ref 210–475)

## 2020-05-20 LAB — BRAIN NATRIURETIC PEPTIDE: B Natriuretic Peptide: 233.8 pg/mL — ABNORMAL HIGH (ref 0.0–100.0)

## 2020-05-20 MED ORDER — HYDROCORTISONE NA SUCCINATE PF 250 MG IJ SOLR
200.0000 mg | Freq: Once | INTRAMUSCULAR | Status: AC
  Administered 2020-05-20: 200 mg via INTRAVENOUS
  Filled 2020-05-20: qty 200

## 2020-05-20 MED ORDER — SODIUM CHLORIDE 0.9 % IV SOLN
200.0000 mg | Freq: Once | INTRAVENOUS | Status: AC
  Administered 2020-05-21: 200 mg via INTRAVENOUS
  Filled 2020-05-20: qty 40

## 2020-05-20 MED ORDER — LORAZEPAM 2 MG/ML IJ SOLN
0.5000 mg | Freq: Once | INTRAMUSCULAR | Status: AC
  Administered 2020-05-20: 0.5 mg via INTRAVENOUS
  Filled 2020-05-20: qty 1

## 2020-05-20 MED ORDER — DIPHENHYDRAMINE HCL 50 MG/ML IJ SOLN
50.0000 mg | Freq: Once | INTRAMUSCULAR | Status: AC
  Administered 2020-05-20: 50 mg via INTRAVENOUS
  Filled 2020-05-20: qty 1

## 2020-05-20 MED ORDER — SODIUM CHLORIDE 0.9 % IV SOLN
100.0000 mg | Freq: Every day | INTRAVENOUS | Status: DC
  Filled 2020-05-20: qty 20

## 2020-05-20 MED ORDER — HEPARIN SODIUM (PORCINE) 5000 UNIT/ML IJ SOLN
5000.0000 [IU] | Freq: Three times a day (TID) | INTRAMUSCULAR | Status: DC
  Administered 2020-05-21 (×3): 5000 [IU] via SUBCUTANEOUS
  Filled 2020-05-20 (×3): qty 1

## 2020-05-20 MED ORDER — IOHEXOL 350 MG/ML SOLN
80.0000 mL | Freq: Once | INTRAVENOUS | Status: AC | PRN
  Administered 2020-05-20: 80 mL via INTRAVENOUS

## 2020-05-20 MED ORDER — ACETAMINOPHEN 325 MG PO TABS: 650.0000 mg | ORAL_TABLET | Freq: Four times a day (QID) | ORAL | Status: DC | PRN

## 2020-05-20 MED ORDER — DIPHENHYDRAMINE HCL 25 MG PO CAPS: 50.0000 mg | ORAL_CAPSULE | Freq: Once | ORAL | Status: AC

## 2020-05-20 MED ORDER — DEXAMETHASONE 4 MG PO TABS
6.0000 mg | ORAL_TABLET | ORAL | Status: DC
Start: 2020-05-21 — End: 2020-05-21
  Administered 2020-05-21: 6 mg via ORAL
  Filled 2020-05-20: qty 2

## 2020-05-20 NOTE — ED Notes (Signed)
Patient transported to CT 

## 2020-05-20 NOTE — H&P (Addendum)
[facility] Admission History and Physical Service Pager: [phone]  Patient name: Dale [name] Medical record number: [id number] Date of birth: 08/31/1965 Age: 55 y.o. Gender: male  Primary Care Provider: Donney Dice, DO Consultants: Nephrology Code Status: FULL  Chief Complaint: Dyspnea  Assessment and Plan: Dale [name] is a 55 y.o. male presenting with acute on chronic hypoxemic respiratory failure in the setting of COVID-19 infection. PMH is significant for ESRD on HD s/p failed renal transplant, PAD s/p left BKA, CAD, T1DM, HLD, anemia, hypothyroidism, OSA, obesity.  Covid-19 infection  Acute on Chronic Hypoxemic Respiratory Failure  Pneumonia  Worsening Shortness of breath Fully vaccinated and boosted male presenting with worsening dyspnea and increasing oxygen requirement (normally on 2L at night only, now using 2-3L throughout the day) over the past 2 to 3 days who recently tested positive for COVID-19 on 1/14 and was treated outpatient with Mab infusion (sotrovimab 1/15) and molnupiravir nucleoside analog.  On presentation, he was afebrile, mildly tachypneic, mildly hypertensive, and maintaining good SpO2 on 6L [place].  Labs notable for elevated inflammatory markers, mildly elevated BNP (234, compared to 141 twelve days ago), normal lactate and normal WBC.  CXR with evidence of mild interstitial edema, CTA chest without evidence of PE and with evidence of multifocal pneumonia versus edema.  He was started on remdesivir and received a dose of hydrocortisone for premedication to go to CT scan (due to allergy to iodinated contrast).  On our evaluation, he was tachypneic with bibasilar rales and unable to complete full sentences without taking a breath.  He was weaned to 3L [place] and maintain good SpO2 in the high 90s.  Suspect symptoms are related to COVID-19 infection with volume overload due to missed HD contributing as well.  Will admit patient for COVID-19  treatment and monitoring of respiratory status. Dry weight ~126.5kg, no weight obtained.  - admit to FPTS, med-surg, Dr. Andria Frames attending - remdesivir, dexamethasone - s/p hydrocortisone - trend inflammatory markers - AM labs: CBC, CMP - supplemental O2, wean as tolerated - continuous pulse ox, cardiac monitoring - vitals per unit routine - airborne/contact precautions - PT/OT  ESRD On MWF schedule HD s/p failed renal transplant.  He missed his HD session today as he was in the ED, however his shortness of breath started before his HD session on Monday. He reports that he had less fluid removed than usual on Monday. Med list includes calcium acetate, midodrine, sevelamer carbonate. - nephrology consulted, appreciate involvement - HD per nephrology - continue home meds pending formal med rec  Macrocytic anemia Hemoglobin decreased 8.8 (was 10.8 on 1/14) with macrocytosis which has been chronic. - monitor on CBC - transfusion threshold 8 given cardiac history - Follow up alcohol use  T1DM  Followed by endocrinology.  He uses the Omni pod insulin pump. Removed for imaging. - may need SSI if unable to use insulin pump - sSSI - follow up med rec  History of HTN Has not been on any antihypertensives for several years due to issues with hypotension.  SBP mostly in the 140s to 160s since presentation.  Suspect hypertension from fluid overload due to missing HD today and will likely improve with HD. - monitor BP - follow up after HD  Hypothyroidism Med list includes levothyroxine 112 mcg. - continue home meds pending formal med rec - follow up repeat TSH  CAD  History of NSTEMI in 2015. On DAPT and statin. Med list includes ASA 81  mg, prasugrel, NTG prn, atorvastatin 20 mg.  - continue DAPT, statin - follow up repeat Lipid panel, consider increasing Statin dose  PAD s/p left BKA - Typically wheelchair-bound. - Continue DAPT, statin  HFpEF Most recent TTE in July 2018 with EF  60 to 65%, G1DD.  Not on any diuretics. BNP 243. - Repeat Echo   Chronic pain Med list includes hydromorphone 2 mg BID prn  HLD Med list includes atorvastatin 20 mg. LDL goal 70. - continue home meds pending formal med rec - Repeat Lipid panel  OSA Does not use CPAP as he cannot tolerate the mask.  Instead, he has been using supplemental oxygen at night. - Continue O2 at night - Consider re-trying CPAP while in hospital?  GERD Med list includes famotidine 20 mg twice daily. - continue home meds pending formal med rec  Gout Med list includes allopurinol 200 mg daily. - continue home meds pending formal med rec  Allergic rhinitis Med list includes cetirizine 10 mg, Flonase. - continue home meds pending formal med rec  Testosterone deficiency Med list includes testosterone cypionate injection every 14 days.  FEN/GI: carb-modified diet Prophylaxis: [place]  Disposition: med-surg  History of Present Illness:  Dale [name] is a 55 y.o. male presenting with dyspnea.  About 2 or 3 days ago started having increased work of breathing, he wanted to monitor it but it kept getting worse. He is usually on 2 L , he started off with this. He went up to 3 L a couple of years ago. He has been on Oxygen since 2014 he thinks. He uses it at bedtime only, he's been using it during the day since 5 days ago when he started having worsening shortness of breath or when he got COVID. Occasionally he uses more than 3L, used about 4L because he felt like he wasn't getting enough O2. He did not think more oxygen helped his breathing. He feels like his symptoms got better and then they started to come back 2-3 days ago.   Last HD was Monday Jan 24th, did 3.5 hours, he's not sure how much fluid they took off, he was 4.1 kg up; he did not go today, has been here since 2pm Dry weight ~126.5  Patient was recently seen in the ED for dyspnea on 1/14 and had tested positive for COVID-19 then.  Initial plan for  admission; however, patient preferred to go home due to impending winter storm.  Thus, he was discharged with plans for outpatient COVID-19 treatment.  He underwent sotrovimab monoclonal antibody fusion on 1/15 and was also prescribed molnupiravir nucleoside analog for 5 days.  Review Of Systems: Per HPI with the following additions:   ROS   Shaky hands, trouble holding objects (new for him), has had nausea which kept him from eating Chest tightness No fevers, chills, leg swelling   Patient Active Problem List   Diagnosis Date Noted  . Dyspnea 05/08/2020  . Multifocal pneumonia 05/05/2020  . Lymph node enlargement 08/13/2018  . Clotted renal dialysis arteriovenous graft ([place]) 08/02/2018  . Pressure injury of skin 08/02/2018  . Upper airway cough syndrome 08/11/2017  . Morbid obesity due to excess calories ([place]) 08/11/2017  . Chronic respiratory failure with hypoxia ([place]) 08/10/2017  . GERD (gastroesophageal reflux disease) 07/29/2017  . Rash and nonspecific skin eruption 07/27/2017  . Contact dermatitis 07/27/2017  . ESRD on hemodialysis ([place])   . Sepsis ([place]) 07/12/2017  . T1DM (type 1 diabetes mellitus) ([place]) 07/12/2017  .  Hypotension (arterial) 10/15/2016  . Heme positive stool   . IDDM with complications 62/70/3500  . Oxygen dependent 03/24/2016  . Visit for preventive health examination 03/24/2016  . Chronic diastolic heart failure ([place]) 02/25/2016  . Tracheobronchomalacia 02/04/2016  . Anemia of chronic disease   . Iron deficiency anemia   . Gastrointestinal hemorrhage with melena 12/21/2015  . Pulmonary edema 03/09/2015  . Chronic pain 03/09/2015  . Hyperkalemia 03/09/2015  . HLD (hyperlipidemia) 11/27/2014  . ESRD on dialysis ([place]) 09/30/2014  . Coronary artery disease involving native coronary artery of native heart with unstable angina pectoris ([place]) 03/20/2014  . Allergic reaction to dye 03/20/2014  . NSTEMI (non-ST elevated myocardial infarction) ([place])  03/19/2014  . HTN (hypertension) 02/18/2014  . ESRD (end stage renal disease) ([place]) 08/27/2011  . Abdominal pain 08/27/2011  . Pericardial effusion 08/27/2011  . History of renal transplant 07/22/2011  . S/P BKA (below knee amputation) ([place]) 07/22/2011  . OSA (obstructive sleep apnea) 07/22/2011  . DOE (dyspnea on exertion) 07/22/2011    Past Medical History: Past Medical History:  Diagnosis Date  . (HFpEF) heart failure with preserved ejection fraction ([place])    a. Echo 3/13:  EF 55-60%;  b. Echo (11/15):  Mild LVH, EF 60-65%, no RWMA, Gr 1 DD, MAC, mild LAE, normal RVF, mild RAE;  c.  Echo 5/16:  severe LVH, EF 55-60%, no RWMA, Gr 1 DD, MAC, mild LAE // Echo 7/18: EF 60-65, Gr 1 DD  . Anal fissure   . Anemia of chronic disease   . Arthritis    "hands, right knee" (03/19/2014) no cartlidge in right knee  . CAD (coronary artery disease)    a. Abnl nuc ->LHC (11/15):  mid to dist Dx 80%, mid RCA 99% (functional CTO), dist RCA 80% >> PCI: balloon angioplasty to mid RCA (could not deliver stent) - staged PCI Rotoblator atherectomy/DES to [facility]. b. NSTEMI 12/2015 s/p DES to diagonal.  . Cholecystitis    a. 08/27/2011  . Claustrophobia    when things get around his face.   . Complication of anesthesia   . Difficult intubation    only once in 2015 at [facility] haven't had a problem since then  . Diverticulosis   . Esophageal dysmotility   . ESRD (end stage renal disease) ([place])    a. 1995 s/p cadaveric transplant w/ susbequent failure after 18 yrs;  b.Dialysis initiated 07/2011 [facility] M-W-F  . Fibromyalgia   . Gastroparesis   . GERD (gastroesophageal reflux disease)   . Gout    PMH  . History of blood transfusion   . History of COVID-19 04/2020   tx with sotrovimab (mAb) and molnupiravir (nucleoside analog)  . History of kidney stones    early 1990's  . HLD (hyperlipidemia)   . Hypertension   . Hypothyroidism   . Inguinal lymphadenopathy    a. bilateral - s/p biopsy 07/2011   . Insulin dependent diabetes mellitus    Type I  . Monilial esophagitis ([place])   . Morbid obesity ([place])   . OSA (obstructive sleep apnea)    adjustable bed and uses O2 L at bedtime  . PAD (peripheral artery disease) ([facility])    a. s/p L BKA.  [name] Kitchen Pericardial effusion    a.  Small by CT 08/22/11;  b.  Large by CT 08/27/11. c. Not seen on 11/2015 echo.  . Pneumonia ~ 2007  . PONV (postoperative nausea and vomiting)    vomited once after a  procedure.  . T1DM (type 1 diabetes mellitus) ([place])   . Tracheobronchomalacia     Past Surgical History: Past Surgical History:  Procedure Laterality Date  . A/V FISTULAGRAM Left 07/26/2016   Procedure: A/V Fistulagram;  Surgeon: Waynetta Sandy, MD;  Location: [place] CV LAB;  Service: Cardiovascular;  Laterality: Left;  . A/V FISTULAGRAM N/A 07/11/2017   Procedure: A/V FISTULAGRAM - Left AV;  Surgeon: Waynetta Sandy, MD;  Location: [place] CV LAB;  Service: Cardiovascular;  Laterality: N/A;  . AMPUTATION OF REPLICATED TOES Right   . ANGIOPLASTY  04/09/2014   RCA   . APPENDECTOMY  ~ 2006  . ARTERIOVENOUS GRAFT PLACEMENT Left 1993?   forearm  . ARTERIOVENOUS GRAFT PLACEMENT Right 1993?   leg  . ARTERIOVENOUS GRAFT PLACEMENT Left    Thigh  . Arteriovenous Graft Removed Left    Thigh  . AV FISTULA PLACEMENT  07/29/2011   Procedure: ARTERIOVENOUS (AV) FISTULA CREATION;  Surgeon: Mal Misty, MD;  Location: [place];  Service: Vascular;  Laterality: Right;  Brachial cephalic  . AV FISTULA PLACEMENT Left 01/20/2015   Procedure: LIGATION OF RIGHT ARTERIOVENOUS FISTULA WITH EXCISION OF ANEURYSM;  Surgeon: Angelia Mould, MD;  Location: [place];  Service: Vascular;  Laterality: Left;  . AV FISTULA PLACEMENT Left 04/30/2015   Procedure: CREATION OF LEFT ARM BRACHIO-CEPHALIC ARTERIOVENOUS (AV) FISTULA ;  Surgeon: Angelia Mould, MD;  Location: [place];  Service: Vascular;  Laterality: Left;  . AV Graft PLACEMENT Left 1993?    "attempted one in my wrist; didn't take"  . BASCILIC VEIN TRANSPOSITION Left 11/01/2018   Procedure: FIRST STAGE BASILIC VEIN TRANSPOSITION  LEFT ARM;  Surgeon: Serafina Mitchell, MD;  Location: [place];  Service: Vascular;  Laterality: Left;  . BASCILIC VEIN TRANSPOSITION Left 12/27/2018   Procedure: SECOND STAGE BASILIC VEIN TRANSPOSITION  LEFT ARM;  Surgeon: Serafina Mitchell, MD;  Location: [place];  Service: Vascular;  Laterality: Left;  . BELOW KNEE LEG AMPUTATION Left 2010  . CARDIAC CATHETERIZATION  03/19/2014  . CARDIAC CATHETERIZATION  03/19/2014   Procedure: CORONARY BALLOON ANGIOPLASTY;  Surgeon: Burnell Blanks, MD;  Location: [facility] CATH LAB;  Service: Cardiovascular;;  . CARDIAC CATHETERIZATION N/A 01/01/2016   Procedure: Left Heart Cath and Coronary Angiography;  Surgeon: Troy Sine, MD;  Location: [place] CV LAB;  Service: Cardiovascular;  Laterality: N/A;  . CARDIAC CATHETERIZATION N/A 01/01/2016   Procedure: Coronary Stent Intervention;  Surgeon: Troy Sine, MD;  Location: [place] CV LAB;  Service: Cardiovascular;  Laterality: N/A;  . CARPAL TUNNEL RELEASE Bilateral after 2005  . CATARACT EXTRACTION W/ INTRAOCULAR LENS  IMPLANT, BILATERAL Bilateral 1990's  . COLONOSCOPY  08/29/2011   Procedure: COLONOSCOPY;  Surgeon: Jerene Bears, MD;  [facility] [place];  Service: Gastroenterology;  Laterality: N/A;  . COLONOSCOPY WITH PROPOFOL N/A 04/26/2016   Procedure: COLONOSCOPY WITH PROPOFOL;  Surgeon: Jerene Bears, MD;  Location: [facility] ENDOSCOPY;  Service: Gastroenterology;  Laterality: N/A;  . CORONARY ANGIOPLASTY WITH STENT PLACEMENT  04/09/2014       ptca/des mid lad   . ESOPHAGOGASTRODUODENOSCOPY N/A 12/25/2015   Procedure: ESOPHAGOGASTRODUODENOSCOPY (EGD);  Surgeon: Mauri Pole, MD;  Location: [facility] ENDOSCOPY;  Service: Endoscopy;  Laterality: N/A;  bedside  . ESOPHAGOGASTRODUODENOSCOPY (EGD) WITH PROPOFOL N/A 04/26/2016   Procedure: ESOPHAGOGASTRODUODENOSCOPY (EGD) WITH  PROPOFOL;  Surgeon: Jerene Bears, MD;  Location: [facility] ENDOSCOPY;  Service: Gastroenterology;  Laterality: N/A;  . EYE SURGERY Bilateral  cataract  . FEMORAL ARTERY REPAIR  04/09/2014   ANGIOSEAL  . INSERTION OF DIALYSIS CATHETER  08/01/2011   Procedure: INSERTION OF DIALYSIS CATHETER;  Surgeon: Rosetta Posner, MD;  Location: [place];  Service: Vascular;  Laterality: Right;  insertion of dialysis catheter on right internal jugular vein  . INSERTION OF DIALYSIS CATHETER Right 01/20/2015   Procedure: INSERTION OF DIALYSIS CATHETER - RIGHT INTERNAL JUGULAR ;  Surgeon: Angelia Mould, MD;  Location: [place];  Service: Vascular;  Laterality: Right;  . INSERTION OF DIALYSIS CATHETER Right 08/02/2018   Procedure: Insertion Of Dialysis Catheter;  Surgeon: Angelia Mould, MD;  Location: [facility] OR;  Service: Vascular;  Laterality: Right;  . KIDNEY TRANSPLANT  04/25/1993  . LEFT HEART CATHETERIZATION WITH CORONARY ANGIOGRAM N/A 03/19/2014   Procedure: LEFT HEART CATHETERIZATION WITH CORONARY ANGIOGRAM;  Surgeon: Burnell Blanks, MD;  Location: [facility] CATH LAB;  Service: Cardiovascular;  Laterality: N/A;  . LIGATION OF ARTERIOVENOUS  FISTULA Left 11/01/2018   Procedure: Ligation Of  Brachio Cephalic Arteriovenous  Fistula;  Surgeon: Serafina Mitchell, MD;  Location: [facility] OR;  Service: Vascular;  Laterality: Left;  . LIGATION OF ARTERIOVENOUS  FISTULA Left 02/19/2019   Procedure: LIGATION OF ARTERIOVENOUS  FISTULA LEFT ARM;  Surgeon: Angelia Mould, MD;  Location: [place];  Service: Vascular;  Laterality: Left;  . LYMPH NODE BIOPSY  08/10/2011   Procedure: LYMPH NODE BIOPSY;  Surgeon: Merrie Roof, MD;  Location: [place];  Service: General;  Laterality: Left;  left groin lymph biopsy  . NEUROPLASTY / TRANSPOSITION ULNAR NERVE AT ELBOW Left after 2005  . PATCH ANGIOPLASTY Left 08/09/2016   Procedure: LEFT  ARTERIOVENOUS FISTULA PATCH ANGIOPLASTY;  Surgeon: Waynetta Sandy, MD;  Location: [place];   Service: Vascular;  Laterality: Left;  . PERCUTANEOUS CORONARY ROTOBLATOR INTERVENTION (PCI-R) N/A 04/09/2014   Procedure: PERCUTANEOUS CORONARY ROTOBLATOR INTERVENTION (PCI-R);  Surgeon: Burnell Blanks, MD;  Location: [facility] CATH LAB;  Service: Cardiovascular;  Laterality: N/A;  . PERIPHERAL VASCULAR BALLOON ANGIOPLASTY  07/11/2017   Procedure: PERIPHERAL VASCULAR BALLOON ANGIOPLASTY;  Surgeon: Waynetta Sandy, MD;  Location: [place] CV LAB;  Service: Cardiovascular;;  left arm AV fistula  . PERIPHERAL VASCULAR CATHETERIZATION N/A 12/25/2014   Procedure: Upper Extremity Venography;  Surgeon: Conrad [place], MD;  Location: [place] CV LAB;  Service: Cardiovascular;  Laterality: N/A;  . PERIPHERAL VASCULAR CATHETERIZATION Left 02/24/2016   Procedure: Fistulagram;  Surgeon: Waynetta Sandy, MD;  Location: [place] CV LAB;  Service: Cardiovascular;  Laterality: Left;  . PERIPHERAL VASCULAR CATHETERIZATION Left 02/24/2016   Procedure: Peripheral Vascular Intervention;  Surgeon: Waynetta Sandy, MD;  Location: [place] [facility] LAB;  Service: Cardiovascular;  Laterality: Left;  AV FISTULA  . PERITONEAL CATHETER INSERTION    . PERITONEAL CATHETER REMOVAL    . REVISON OF ARTERIOVENOUS FISTULA Right 4/70/9628   Procedure: PLICATION / REVISION OF ARTERIOVENOUS FISTULA;  Surgeon: Angelia Mould, MD;  Location: [place];  Service: Vascular;  Laterality: Right;  . REVISON OF ARTERIOVENOUS FISTULA Left 09/01/2015   Procedure: SUPERFICIALIZATION OF LEFT ARM BRACHIOCEPHALIC ARTERIOVENOUS FISTULA;  Surgeon: Angelia Mould, MD;  Location: [place];  Service: Vascular;  Laterality: Left;  . REVISON OF ARTERIOVENOUS FISTULA Left 08/09/2016   Procedure: REVISION OF ARTERIAL ANASTOMOSIS OF ARTERIOVENOUS FISTULA;  Surgeon: Waynetta Sandy, MD;  Location: [place];  Service: Vascular;  Laterality: Left;  . SHOULDER ARTHROSCOPY Left ~ 2006   frozen  .  THROMBECTOMY W/  EMBOLECTOMY Left 08/02/2018   Procedure: THROMBECTOMY ARTERIOVENOUS FISTULA;  Surgeon: Angelia Mould, MD;  Location: [place];  Service: Vascular;  Laterality: Left;  . TOE AMPUTATION Bilateral after 2005  . UMBILICAL HERNIA REPAIR  1990's  . UPPER EXTREMITY VENOGRAPHY Bilateral 10/16/2018   Procedure: UPPER EXTREMITY VENOGRAPHY;  Surgeon: Serafina Mitchell, MD;  Location: [place] CV LAB;  Service: Cardiovascular;  Laterality: Bilateral;  . VENOGRAM Right 07/28/2011   Procedure: VENOGRAM;  Surgeon: Conrad [place], MD;  Location: [facility] CATH LAB;  Service: Cardiovascular;  Laterality: Right;  . VITRECTOMY Bilateral     Social History: Social History   Tobacco Use  . Smoking status: Never Smoker  . Smokeless tobacco: Never Used  Vaping Use  . Vaping Use: Never used  Substance Use Topics  . Alcohol use: Yes    Comment: rare  . Drug use: No    Family History: Family History  Problem Relation Age of Onset  . Colon polyps Father   . Diabetes Father   . Hypertension Father   . Hypertension Mother   . Diabetes Mother   . Hyperlipidemia Mother   . Breast cancer Maternal Aunt   . Bone cancer Maternal Aunt   . Liver cancer Maternal Aunt   . Malignant hyperthermia Neg Hx   . Heart attack Neg Hx   . Stroke Neg Hx   . Colon cancer Neg Hx   . Stomach cancer Neg Hx   . Pancreatic cancer Neg Hx   . Esophageal cancer Neg Hx     Allergies and Medications: Allergies  Allergen Reactions  . Iodinated Diagnostic Agents Itching, Rash and Other (See Comments)    Systemic itching and transient rash appearance within hours of receiving contrast    . Prednisone Other (See Comments)    Increases sugar levels  . Adhesive [Tape] Itching and Other (See Comments)    Blisters on arm from adhesive tape at dialysis   . Amoxicillin Itching, Rash and Other (See Comments)    Did it involve swelling of the face/tongue/throat, SOB, or low BP? No Did it involve sudden or severe rash/hives, skin  peeling, or any reaction on the inside of your mouth or nose? No Did you need to seek medical attention at a hospital or doctor's office? No When did it last happen?10+ years If all above answers are "NO", may proceed with cephalosporin use.   . Clindamycin/Lincomycin Hives and Itching  . Enalapril Rash and Cough    Cough  . Mobic [Meloxicam] Hives and Rash  . Morphine Hives, Nausea Only and Rash  . Brilinta [Ticagrelor] Nausea And Vomiting  . Ciprofloxacin Itching and Rash  . Clopidogrel Rash  . Codeine Hives, Itching and Rash  . Doxycycline Rash  . Hydrocodone Rash  . Levofloxacin Hives and Itching  . Oxycodone Rash  . Rocephin [Ceftriaxone] Itching   No current facility-administered medications on file prior to encounter.   Current Outpatient Medications on File Prior to Encounter  Medication Sig Dispense Refill  . acetaminophen (TYLENOL) 500 MG tablet Take 1,000 mg by mouth every 6 (six) hours as needed (pain/headaches.).     [name] Kitchen allopurinol (ZYLOPRIM) 100 MG tablet TAKE 2 TABLETS BY MOUTH DAILY 180 tablet 3  . aspirin EC 81 MG tablet Take 81 mg by mouth at bedtime.    [name] Kitchen atorvastatin (LIPITOR) 20 MG tablet Take 20 mg by mouth daily.    . B Complex-C-Folic Acid (DIALYVITE 242) 0.8 MG TABS Take  1 tablet by mouth daily.    . calcium acetate (PHOSLO) 667 MG capsule Take by mouth See admin instructions. Take 3 Capsules by mouth with each meal daily    . cetirizine (ZYRTEC) 10 MG tablet Take 10 mg by mouth daily.    . diphenhydrAMINE (BENADRYL) 25 MG tablet Take 50 mg by mouth at bedtime.    . docusate sodium (COLACE) 100 MG capsule Take 100 mg by mouth 2 (two) times daily.     . famotidine (PEPCID) 20 MG tablet Take 20 mg by mouth 2 (two) times daily.    . fluticasone (FLONASE) 50 MCG/ACT nasal spray Place 2 sprays into both nostrils daily as needed for allergies.     [name] Kitchen gabapentin (NEURONTIN) 300 MG capsule Take 300 mg by mouth 2 (two) times daily.    [name] Kitchen GLUCAGON EMERGENCY 1 MG  injection Inject 1 mg into the muscle daily as needed (low blood sugar).     . Glucose-Cholecalciferol (RELION GLUCOSE SHOT) LIQD Take 1 Dose by mouth daily as needed (low blood sugar).    . hydrocortisone 2.5 % cream Apply 1 application topically 2 (two) times daily as needed (rash / itching).    [name] Kitchen HYDROmorphone (DILAUDID) 2 MG tablet Take 2 mg by mouth 2 (two) times daily as needed for moderate pain or severe pain.     [name] Kitchen insulin lispro (HUMALOG) 100 UNIT/ML injection Inject 75-120 Units into the skin continuous. Per insulin pump    . levothyroxine (SYNTHROID, LEVOTHROID) 112 MCG tablet Take 112 mcg by mouth at bedtime.     [name] Kitchen loperamide (IMODIUM A-D) 2 MG tablet Take 2-4 mg by mouth 4 (four) times daily as needed for diarrhea or loose stools.    . Melatonin 5 MG TABS Take 5 mg by mouth at bedtime.    . midodrine (PROAMATINE) 10 MG tablet Take 1 tablet (10 mg total) by mouth 3 (three) times daily. Takes 1 tab in mornings of dialysis and takes 2 tabs 30 mins before starting dialysis (m,w,f)    . nitroGLYCERIN (NITROSTAT) 0.4 MG SL tablet Place 0.4 mg under the tongue every 5 (five) minutes as needed for chest pain.    Vladimir Faster Glycol-Propyl Glycol (SYSTANE ULTRA PF OP) Place 1 drop into both eyes every 2 (two) hours as needed (dry eyes).    Vladimir Faster Glycol-Propyl Glycol 0.4-0.3 % SOLN Place 1 drop into both eyes 4 (four) times daily.    . polyethylene glycol (MIRALAX / GLYCOLAX) packet Take 17 g by mouth daily. 14 each 0  . prasugrel (EFFIENT) 10 MG TABS tablet Take 1 tablet (10 mg total) by mouth daily. Please keep upcoming appt in January 2022 before anymore refills. Thank you 90 tablet 0  . promethazine (PHENERGAN) 25 MG tablet Take 25 mg by mouth every 6 (six) hours as needed for nausea or vomiting.    [name] Kitchen rOPINIRole (REQUIP) 2 MG tablet Take 2 mg by mouth at bedtime.    . sevelamer carbonate (RENVELA) 800 MG tablet Take 800 mg by mouth 3 (three) times daily with meals.    [name] Kitchen testosterone  cypionate (DEPOTESTOSTERONE CYPIONATE) 200 MG/ML injection Inject 200 mg into the muscle every 14 (fourteen) days.   1    Objective: BP (!) 160/76   Pulse 85   Temp 98.1 F (36.7 C) (Oral)   Resp 18   SpO2 100%  Exam: General: Obese male, sitting up in bed, NAD Eyes: EOMI Neck: supple Cardiovascular: RRR, no murmurs Respiratory: bibasilar rales,  tachypneic with labored breathing, unable to speak in complete sentences without taking a breath Gastrointestinal: soft, non-tender, distended MSK: Left BKA, compression stocking on right leg without edema Derm: warm, dry, insulin pump on left arm Neuro: Alert, interactive, full strength in bilateral upper extremities Psych: affect appropriate  Labs and Imaging: CBC BMET  Recent Labs  Lab 05/20/20 1527  WBC 7.4  HGB 8.8*  HCT 29.9*  PLT 293   Recent Labs  Lab 05/20/20 1527  NA 133*  K 4.5  CL 92*  CO2 29  BUN 40*  CREATININE 9.56*  GLUCOSE 212*  CALCIUM 8.1*     DG Chest 2 View  Result Date: 05/20/2020 CLINICAL DATA:  Shortness of breath EXAM: CHEST - 2 VIEW COMPARISON:  05/08/2020 FINDINGS: Right IJ approach dialysis catheter tip is at the right atrium. Low lung volumes. Probable mild interstitial edema similar to prior study. No significant pleural effusion. No pneumothorax. Stable cardiomediastinal contours. No acute osseous abnormality. IMPRESSION: Probable mild interstitial edema. Electronically Signed   By: Macy Mis M.D.   On: 05/20/2020 14:55   CT Angio Chest PE W and/or Wo Contrast  Result Date: 05/20/2020 CLINICAL DATA:  Worsening shortness of breath and nausea after positive COVID diagnosis on 05/08/2020. Pulmonary embolus is suspected with high probability. EXAM: CT ANGIOGRAPHY CHEST WITH CONTRAST TECHNIQUE: Multidetector CT imaging of the chest was performed using the standard protocol during bolus administration of intravenous contrast. Multiplanar CT image reconstructions and MIPs were obtained to evaluate  the vascular anatomy. CONTRAST:  12m OMNIPAQUE IOHEXOL 350 MG/ML SOLN COMPARISON:  01/28/2016 CT chest.  Chest radiograph 05/20/2020 FINDINGS: Cardiovascular: Satisfactory opacification of the pulmonary arteries to the segmental level. No evidence of pulmonary embolism. Mild cardiac enlargement. No pericardial effusion. Scattered aortic calcification. Diffuse coronary artery calcification. No aortic dissection. Focal narrowing of the SVC above the level of the left brachiocephalic. Multiple venous collaterals in the upper chest. Mediastinum/Nodes: Mediastinal lymphadenopathy with right paratracheal nodes 1.7 cm short axis dimension. Similar appearance to previous study. Esophagus is decompressed. Prominent mediastinal fat. Lungs/Pleura: Small bilateral pleural effusions. Patchy airspace disease in the lungs could represent multifocal pneumonia or edema. Upper Abdomen: No acute abnormalities demonstrated in the visualized upper abdomen. Severe atrophy of the right kidney. Musculoskeletal: Degenerative changes in the spine with bridging anterior osteophytes. Diffuse skeletal sclerosis likely representing renal osteodystrophy. Review of the MIP images confirms the above findings. IMPRESSION: 1. No evidence of significant pulmonary embolus. 2. Small bilateral pleural effusions. 3. Patchy airspace disease in the lungs could represent multifocal pneumonia or edema. 4. Mediastinal lymphadenopathy, unchanged since prior study from 2017, likely reactive. 5. Focal narrowing of the SVC above the level of the left brachiocephalic. Multiple venous collaterals in the upper chest. 6. Severe atrophy of the right kidney. 7. Diffuse skeletal sclerosis likely representing renal osteodystrophy. 8. Aortic atherosclerosis. Aortic Atherosclerosis (ICD10-I70.0). Electronically Signed   By: WLucienne CapersM.D.   On: 05/20/2020 23:14     SZola Button MD 05/20/2020, 10:01 PM PGY-1, CBascomIntern pager:  [phone] text pages welcome   FPTS Upper-Level Resident Addendum  I have independently interviewed and examined the patient. I have discussed the above with the original author and agree with their documentation. My edits for correction/addition/clarification are in italics. Please see also any attending notes.    HMilus Banister DO PGY-3, CDublinFamily Medicine 05/21/2020 6:53 AM  FPTS Service pager: [phone](text pages welcome through [facility]

## 2020-05-20 NOTE — ED Triage Notes (Signed)
Patient here with complaint of worsened shortness of breath, nausea, and brain for after testing positive for COVID on 05/08/2020. Patient wears 3L O2 [place] at baseline, no change in supplemental oxygen requirement.

## 2020-05-20 NOTE — H&P (Incomplete)
[facility] Admission History and Physical Service Pager: [phone]  Patient name: Dale [name] Medical record number: [id number] Date of birth: 12-30-1965 Age: 55 y.o. Gender: male  Primary Care Provider: Donney Dice, DO Consultants: Nephrology Code Status: FULL  Chief Complaint: Dyspnea  Assessment and Plan: Dale [name] is a 55 y.o. male presenting with acute on chronic hypoxemic respiratory failure in the setting of COVID-19 infection. PMH is significant for ESRD on HD s/p failed renal transplant, PAD s/p left BKA, CAD, T1DM, HLD, anemia, hypothyroidism, OSA, obesity.  Covid-19 infection  Acute on Chronic Hypoxemic Respiratory Failure Fully vaccinated and boosted male presenting with worsening dyspnea and increasing oxygen requirement (normally on 2L at night only, now using 2-3L throughout the day) who recently tested positive for COVID-19 on 1/14 and was treated outpatient with Mab infusion (sotrovimab 1/15) and molnupiravir nucleoside analog.  On presentation, he was afebrile, mildly tachypneic, mildly hypertensive, and maintaining good SpO2 on 6L [place].  Labs notable for elevated inflammatory markers, normal lactate and normal WBC.  ESRD On MWF schedule HD s/p failed renal transplant.  He missed his HD session today as he was in the ED. - nephrology consulted, appreciate involvement - HD per nephrology  Macrocytic anemia   T1DM  Followed by endocrinology.  He uses the Omni pod insulin pump. - continue insulin per home insulin pump  History of HTN Has not been on any antihypertensives for several years due to issues with hypotension.  SBP mostly in the 140s to 160s since presentation.  Suspect hypertension from fluid overload due to missing HD today and will likely improve with HD. - monitor BP  Hypothyroidism  CAD  History of NSTEMI in 2015.  HFpEF Most recent TTE in July 2018 with EF 60 to 65%, G1DD.  Not on any  diuretics.   OSA Does not use CPAP as he cannot tolerate the mask.  Instead, he has been using supplemental oxygen at night.   , GERD, ESRD on HD, HLD, CAD, NSTEMI (2015), Hx Renal Transplant, BKA, OSA  FEN/GI: *** Prophylaxis: ***  Disposition: ***  History of Present Illness:  Dale [name] is a 55 y.o. male presenting with dyspnea.  About 2 or 3 days ago started having increased work of breathing, he wanted to monitor it but it kept getting worse. He is usually on 2 L [place], he started off with this. He went up to 3 L a couple of years ago. He has been on Oxygen since 2014 he thinks. He uses it at bedtime only, he's been using it during the day since 5 days ago when he started having worsening shortness of breath or when he got COVID. Occasionally he uses more than 3L, used about 4L because he felt like he wasn't getting enough O2. He did not think more oxygen helped his breathing. He feels like his symptoms got better and then they started to come back 2-3 days ago.   Last HD was Monday Jan 24th, did 3.5 hours, he's not sure how much fluid they took off, he was 4.1 kg up; he did not go today, has been here since 2pm Dry weight ~126.5  Patient was recently seen in the ED for dyspnea on 1/14 and had tested positive for COVID-19 then.  Initial plan for admission; however, patient preferred to go home due to impending winter storm.  Thus, he was discharged with plans for outpatient COVID-19 treatment.  He underwent sotrovimab monoclonal  antibody fusion on 1/15 and was also prescribed molnupiravir nucleoside analog for 5 days.  Review Of Systems: Per HPI with the following additions: ***  ROS  *** Shaky hands, trouble holding objects (new for him), has had nausea which kept him from eating Chest tightness No fevers, chills, leg swelling   Patient Active Problem List   Diagnosis Date Noted  . Dyspnea 05/08/2020  . Multifocal pneumonia 05/05/2020  . Lymph node enlargement 08/13/2018   . Clotted renal dialysis arteriovenous graft ([place]) 08/02/2018  . Pressure injury of skin 08/02/2018  . Upper airway cough syndrome 08/11/2017  . Morbid obesity due to excess calories ([place]) 08/11/2017  . Chronic respiratory failure with hypoxia ([place]) 08/10/2017  . GERD (gastroesophageal reflux disease) 07/29/2017  . Rash and nonspecific skin eruption 07/27/2017  . Contact dermatitis 07/27/2017  . ESRD on hemodialysis ([place])   . Sepsis ([place]) 07/12/2017  . T1DM (type 1 diabetes mellitus) ([place]) 07/12/2017  . Hypotension (arterial) 10/15/2016  . Heme positive stool   . IDDM with complications 36/64/4034  . Oxygen dependent 03/24/2016  . Visit for preventive health examination 03/24/2016  . Chronic diastolic heart failure ([place]) 02/25/2016  . Tracheobronchomalacia 02/04/2016  . Anemia of chronic disease   . Iron deficiency anemia   . Gastrointestinal hemorrhage with melena 12/21/2015  . Pulmonary edema 03/09/2015  . Chronic pain 03/09/2015  . Hyperkalemia 03/09/2015  . HLD (hyperlipidemia) 11/27/2014  . ESRD on dialysis ([place]) 09/30/2014  . Coronary artery disease involving native coronary artery of native heart with unstable angina pectoris ([place]) 03/20/2014  . Allergic reaction to dye 03/20/2014  . NSTEMI (non-ST elevated myocardial infarction) ([place]) 03/19/2014  . HTN (hypertension) 02/18/2014  . ESRD (end stage renal disease) ([place]) 08/27/2011  . Abdominal pain 08/27/2011  . Pericardial effusion 08/27/2011  . History of renal transplant 07/22/2011  . S/P BKA (below knee amputation) ([place]) 07/22/2011  . OSA (obstructive sleep apnea) 07/22/2011  . DOE (dyspnea on exertion) 07/22/2011    Past Medical History: Past Medical History:  Diagnosis Date  . (HFpEF) heart failure with preserved ejection fraction ([place])    a. Echo 3/13:  EF 55-60%;  b. Echo (11/15):  Mild LVH, EF 60-65%, no RWMA, Gr 1 DD, MAC, mild LAE, normal RVF, mild RAE;  c.  Echo 5/16:  severe LVH, EF 55-60%, no RWMA, Gr 1  DD, MAC, mild LAE // Echo 7/18: EF 60-65, Gr 1 DD  . Anal fissure   . Anemia of chronic disease   . Arthritis    "hands, right knee" (03/19/2014) no cartlidge in right knee  . CAD (coronary artery disease)    a. Abnl nuc ->LHC (11/15):  mid to dist Dx 80%, mid RCA 99% (functional CTO), dist RCA 80% >> PCI: balloon angioplasty to mid RCA (could not deliver stent) - staged PCI Rotoblator atherectomy/DES to [facility]. b. NSTEMI 12/2015 s/p DES to diagonal.  . Cholecystitis    a. 08/27/2011  . Claustrophobia    when things get around his face.   . Complication of anesthesia   . Difficult intubation    only once in 2015 at [facility] haven't had a problem since then  . Diverticulosis   . Esophageal dysmotility   . ESRD (end stage renal disease) ([place])    a. 1995 s/p cadaveric transplant w/ susbequent failure after 18 yrs;  b.Dialysis initiated 07/2011 [facility] M-W-F  . Fibromyalgia   . Gastroparesis   . GERD (gastroesophageal reflux disease)   . Gout  PMH  . History of blood transfusion   . History of COVID-19 04/2020   tx with sotrovimab (mAb) and molnupiravir (nucleoside analog)  . History of kidney stones    early 1990's  . HLD (hyperlipidemia)   . Hypertension   . Hypothyroidism   . Inguinal lymphadenopathy    a. bilateral - s/p biopsy 07/2011  . Insulin dependent diabetes mellitus    Type I  . Monilial esophagitis ([place])   . Morbid obesity ([place])   . OSA (obstructive sleep apnea)    adjustable bed and uses O2 L at bedtime  . PAD (peripheral artery disease) ([facility])    a. s/p L BKA.  [name] Kitchen Pericardial effusion    a.  Small by CT 08/22/11;  b.  Large by CT 08/27/11. c. Not seen on 11/2015 echo.  . Pneumonia ~ 2007  . PONV (postoperative nausea and vomiting)    vomited once after a procedure.  . T1DM (type 1 diabetes mellitus) ([place])   . Tracheobronchomalacia     Past Surgical History: Past Surgical History:  Procedure Laterality Date  . A/V FISTULAGRAM Left 07/26/2016   Procedure: A/V  Fistulagram;  Surgeon: Waynetta Sandy, MD;  Location: [place] CV [facility];  Service: Cardiovascular;  Laterality: Left;  . A/V FISTULAGRAM N/A 07/11/2017   Procedure: A/V FISTULAGRAM - Left AV;  Surgeon: Waynetta Sandy, MD;  Location: [place] [facility];  Service: Cardiovascular;  Laterality: N/A;  . AMPUTATION OF REPLICATED TOES Right   . ANGIOPLASTY  04/09/2014   RCA   . APPENDECTOMY  ~ 2006  . ARTERIOVENOUS GRAFT PLACEMENT Left 1993?   forearm  . ARTERIOVENOUS GRAFT PLACEMENT Right 1993?   leg  . ARTERIOVENOUS GRAFT PLACEMENT Left    Thigh  . Arteriovenous Graft Removed Left    Thigh  . AV FISTULA PLACEMENT  07/29/2011   Procedure: ARTERIOVENOUS (AV) FISTULA CREATION;  Surgeon: Mal Misty, MD;  Location: [place];  Service: Vascular;  Laterality: Right;  Brachial cephalic  . AV FISTULA PLACEMENT Left 01/20/2015   Procedure: LIGATION OF RIGHT ARTERIOVENOUS FISTULA WITH EXCISION OF ANEURYSM;  Surgeon: Angelia Mould, MD;  Location: [place];  Service: Vascular;  Laterality: Left;  . AV FISTULA PLACEMENT Left 04/30/2015   Procedure: CREATION OF LEFT ARM BRACHIO-CEPHALIC ARTERIOVENOUS (AV) FISTULA ;  Surgeon: Angelia Mould, MD;  Location: [place];  Service: Vascular;  Laterality: Left;  . AV Graft PLACEMENT Left 1993?   "attempted one in my wrist; didn't take"  . BASCILIC VEIN TRANSPOSITION Left 11/01/2018   Procedure: FIRST STAGE BASILIC VEIN TRANSPOSITION  LEFT ARM;  Surgeon: Serafina Mitchell, MD;  Location: [place];  Service: Vascular;  Laterality: Left;  . BASCILIC VEIN TRANSPOSITION Left 12/27/2018   Procedure: SECOND STAGE BASILIC VEIN TRANSPOSITION  LEFT ARM;  Surgeon: Serafina Mitchell, MD;  Location: [place];  Service: Vascular;  Laterality: Left;  . BELOW KNEE LEG AMPUTATION Left 2010  . CARDIAC CATHETERIZATION  03/19/2014  . CARDIAC CATHETERIZATION  03/19/2014   Procedure: CORONARY BALLOON ANGIOPLASTY;  Surgeon: Burnell Blanks, MD;  Location: [facility] CATH  LAB;  Service: Cardiovascular;;  . CARDIAC CATHETERIZATION N/A 01/01/2016   Procedure: Left Heart Cath and Coronary Angiography;  Surgeon: Troy Sine, MD;  Location: [place] CV LAB;  Service: Cardiovascular;  Laterality: N/A;  . CARDIAC CATHETERIZATION N/A 01/01/2016   Procedure: Coronary Stent Intervention;  Surgeon: Troy Sine, MD;  Location: [place] CV LAB;  Service: Cardiovascular;  Laterality: N/A;  . CARPAL TUNNEL RELEASE Bilateral after 2005  . CATARACT EXTRACTION W/ INTRAOCULAR LENS  IMPLANT, BILATERAL Bilateral 1990's  . COLONOSCOPY  08/29/2011   Procedure: COLONOSCOPY;  Surgeon: Jerene Bears, MD;  Location: [place];  Service: Gastroenterology;  Laterality: N/A;  . COLONOSCOPY WITH PROPOFOL N/A 04/26/2016   Procedure: COLONOSCOPY WITH PROPOFOL;  Surgeon: Jerene Bears, MD;  Location: [facility] ENDOSCOPY;  Service: Gastroenterology;  Laterality: N/A;  . CORONARY ANGIOPLASTY WITH STENT PLACEMENT  04/09/2014       ptca/des mid lad   . ESOPHAGOGASTRODUODENOSCOPY N/A 12/25/2015   Procedure: ESOPHAGOGASTRODUODENOSCOPY (EGD);  Surgeon: Mauri Pole, MD;  Location: [facility] ENDOSCOPY;  Service: Endoscopy;  Laterality: N/A;  bedside  . ESOPHAGOGASTRODUODENOSCOPY (EGD) WITH PROPOFOL N/A 04/26/2016   Procedure: ESOPHAGOGASTRODUODENOSCOPY (EGD) WITH PROPOFOL;  Surgeon: Jerene Bears, MD;  Location: [facility] ENDOSCOPY;  Service: Gastroenterology;  Laterality: N/A;  . EYE SURGERY Bilateral    cataract  . FEMORAL ARTERY REPAIR  04/09/2014   ANGIOSEAL  . INSERTION OF DIALYSIS CATHETER  08/01/2011   Procedure: INSERTION OF DIALYSIS CATHETER;  Surgeon: Rosetta Posner, MD;  Location: [place];  Service: Vascular;  Laterality: Right;  insertion of dialysis catheter on right internal jugular vein  . INSERTION OF DIALYSIS CATHETER Right 01/20/2015   Procedure: INSERTION OF DIALYSIS CATHETER - RIGHT INTERNAL JUGULAR ;  Surgeon: Angelia Mould, MD;  Location: [place];  Service: Vascular;  Laterality: Right;  .  INSERTION OF DIALYSIS CATHETER Right 08/02/2018   Procedure: Insertion Of Dialysis Catheter;  Surgeon: Angelia Mould, MD;  Location: [facility] OR;  Service: Vascular;  Laterality: Right;  . KIDNEY TRANSPLANT  04/25/1993  . LEFT HEART CATHETERIZATION WITH CORONARY ANGIOGRAM N/A 03/19/2014   Procedure: LEFT HEART CATHETERIZATION WITH CORONARY ANGIOGRAM;  Surgeon: Burnell Blanks, MD;  Location: [facility] CATH LAB;  Service: Cardiovascular;  Laterality: N/A;  . LIGATION OF ARTERIOVENOUS  FISTULA Left 11/01/2018   Procedure: Ligation Of  Brachio Cephalic Arteriovenous  Fistula;  Surgeon: Serafina Mitchell, MD;  Location: [facility] OR;  Service: Vascular;  Laterality: Left;  . LIGATION OF ARTERIOVENOUS  FISTULA Left 02/19/2019   Procedure: LIGATION OF ARTERIOVENOUS  FISTULA LEFT ARM;  Surgeon: Angelia Mould, MD;  Location: [place];  Service: Vascular;  Laterality: Left;  . LYMPH NODE BIOPSY  08/10/2011   Procedure: LYMPH NODE BIOPSY;  Surgeon: Merrie Roof, MD;  Location: [place];  Service: General;  Laterality: Left;  left groin lymph biopsy  . NEUROPLASTY / TRANSPOSITION ULNAR NERVE AT ELBOW Left after 2005  . PATCH ANGIOPLASTY Left 08/09/2016   Procedure: LEFT  ARTERIOVENOUS FISTULA PATCH ANGIOPLASTY;  Surgeon: Waynetta Sandy, MD;  Location: [place];  Service: Vascular;  Laterality: Left;  . PERCUTANEOUS CORONARY ROTOBLATOR INTERVENTION (PCI-R) N/A 04/09/2014   Procedure: PERCUTANEOUS CORONARY ROTOBLATOR INTERVENTION (PCI-R);  Surgeon: Burnell Blanks, MD;  Location: [facility] CATH LAB;  Service: Cardiovascular;  Laterality: N/A;  . PERIPHERAL VASCULAR BALLOON ANGIOPLASTY  07/11/2017   Procedure: PERIPHERAL VASCULAR BALLOON ANGIOPLASTY;  Surgeon: Waynetta Sandy, MD;  Location: [place] CV LAB;  Service: Cardiovascular;;  left arm AV fistula  . PERIPHERAL VASCULAR CATHETERIZATION N/A 12/25/2014   Procedure: Upper Extremity Venography;  Surgeon: Conrad [place], MD;  Location: [place] CV  LAB;  Service: Cardiovascular;  Laterality: N/A;  . PERIPHERAL VASCULAR CATHETERIZATION Left 02/24/2016   Procedure: Fistulagram;  Surgeon: Waynetta Sandy, MD;  Location: [place] CV LAB;  Service: Cardiovascular;  Laterality: Left;  . PERIPHERAL VASCULAR CATHETERIZATION Left 02/24/2016   Procedure: Peripheral Vascular Intervention;  Surgeon: Waynetta Sandy, MD;  Location: [place] CV LAB;  Service: Cardiovascular;  Laterality: Left;  AV FISTULA  . PERITONEAL CATHETER INSERTION    . PERITONEAL CATHETER REMOVAL    . REVISON OF ARTERIOVENOUS FISTULA Right 10/18/6387   Procedure: PLICATION / REVISION OF ARTERIOVENOUS FISTULA;  Surgeon: Angelia Mould, MD;  Location: [place];  Service: Vascular;  Laterality: Right;  . REVISON OF ARTERIOVENOUS FISTULA Left 09/01/2015   Procedure: SUPERFICIALIZATION OF LEFT ARM BRACHIOCEPHALIC ARTERIOVENOUS FISTULA;  Surgeon: Angelia Mould, MD;  Location: [place];  Service: Vascular;  Laterality: Left;  . REVISON OF ARTERIOVENOUS FISTULA Left 08/09/2016   Procedure: REVISION OF ARTERIAL ANASTOMOSIS OF ARTERIOVENOUS FISTULA;  Surgeon: Waynetta Sandy, MD;  Location: [place];  Service: Vascular;  Laterality: Left;  . SHOULDER ARTHROSCOPY Left ~ 2006   frozen  . THROMBECTOMY W/ EMBOLECTOMY Left 08/02/2018   Procedure: THROMBECTOMY ARTERIOVENOUS FISTULA;  Surgeon: Angelia Mould, MD;  Location: [place];  Service: Vascular;  Laterality: Left;  . TOE AMPUTATION Bilateral after 2005  . UMBILICAL HERNIA REPAIR  1990's  . UPPER EXTREMITY VENOGRAPHY Bilateral 10/16/2018   Procedure: UPPER EXTREMITY VENOGRAPHY;  Surgeon: Serafina Mitchell, MD;  Location: [place] CV LAB;  Service: Cardiovascular;  Laterality: Bilateral;  . VENOGRAM Right 07/28/2011   Procedure: VENOGRAM;  Surgeon: Conrad [place], MD;  Location: [facility] CATH LAB;  Service: Cardiovascular;  Laterality: Right;  . VITRECTOMY Bilateral     Social History: Social History    Tobacco Use  . Smoking status: Never Smoker  . Smokeless tobacco: Never Used  Vaping Use  . Vaping Use: Never used  Substance Use Topics  . Alcohol use: Yes    Comment: rare  . Drug use: No   Additional social history: ***  Please also refer to relevant sections of EMR.  Family History: Family History  Problem Relation Age of Onset  . Colon polyps Father   . Diabetes Father   . Hypertension Father   . Hypertension Mother   . Diabetes Mother   . Hyperlipidemia Mother   . Breast cancer Maternal Aunt   . Bone cancer Maternal Aunt   . Liver cancer Maternal Aunt   . Malignant hyperthermia Neg Hx   . Heart attack Neg Hx   . Stroke Neg Hx   . Colon cancer Neg Hx   . Stomach cancer Neg Hx   . Pancreatic cancer Neg Hx   . Esophageal cancer Neg Hx    (***If not completed, MUST add something in)  Allergies and Medications: Allergies  Allergen Reactions  . Iodinated Diagnostic Agents Itching, Rash and Other (See Comments)    Systemic itching and transient rash appearance within hours of receiving contrast    . Prednisone Other (See Comments)    Increases sugar levels  . Adhesive [Tape] Itching and Other (See Comments)    Blisters on arm from adhesive tape at dialysis   . Amoxicillin Itching, Rash and Other (See Comments)    Did it involve swelling of the face/tongue/throat, SOB, or low BP? No Did it involve sudden or severe rash/hives, skin peeling, or any reaction on the inside of your mouth or nose? No Did you need to seek medical attention at a hospital or doctor's office? No When did it last happen?10+ years If all above answers are "NO", may proceed with cephalosporin use.   [name] Kitchen  Clindamycin/Lincomycin Hives and Itching  . Enalapril Rash and Cough    Cough  . Mobic [Meloxicam] Hives and Rash  . Morphine Hives, Nausea Only and Rash  . Brilinta [Ticagrelor] Nausea And Vomiting  . Ciprofloxacin Itching and Rash  . Clopidogrel Rash  . Codeine Hives, Itching  and Rash  . Doxycycline Rash  . Hydrocodone Rash  . Levofloxacin Hives and Itching  . Oxycodone Rash  . Rocephin [Ceftriaxone] Itching   No current facility-administered medications on file prior to encounter.   Current Outpatient Medications on File Prior to Encounter  Medication Sig Dispense Refill  . acetaminophen (TYLENOL) 500 MG tablet Take 1,000 mg by mouth every 6 (six) hours as needed (pain/headaches.).     [name] Kitchen allopurinol (ZYLOPRIM) 100 MG tablet TAKE 2 TABLETS BY MOUTH DAILY 180 tablet 3  . aspirin EC 81 MG tablet Take 81 mg by mouth at bedtime.    [name] Kitchen atorvastatin (LIPITOR) 20 MG tablet Take 20 mg by mouth daily.    . B Complex-C-Folic Acid (DIALYVITE 562) 0.8 MG TABS Take 1 tablet by mouth daily.    . calcium acetate (PHOSLO) 667 MG capsule Take by mouth See admin instructions. Take 3 Capsules by mouth with each meal daily    . cetirizine (ZYRTEC) 10 MG tablet Take 10 mg by mouth daily.    . diphenhydrAMINE (BENADRYL) 25 MG tablet Take 50 mg by mouth at bedtime.    . docusate sodium (COLACE) 100 MG capsule Take 100 mg by mouth 2 (two) times daily.     . famotidine (PEPCID) 20 MG tablet Take 20 mg by mouth 2 (two) times daily.    . fluticasone (FLONASE) 50 MCG/ACT nasal spray Place 2 sprays into both nostrils daily as needed for allergies.     [name] Kitchen gabapentin (NEURONTIN) 300 MG capsule Take 300 mg by mouth 2 (two) times daily.    [name] Kitchen GLUCAGON EMERGENCY 1 MG injection Inject 1 mg into the muscle daily as needed (low blood sugar).     . Glucose-Cholecalciferol (RELION GLUCOSE SHOT) LIQD Take 1 Dose by mouth daily as needed (low blood sugar).    . hydrocortisone 2.5 % cream Apply 1 application topically 2 (two) times daily as needed (rash / itching).    [name] Kitchen HYDROmorphone (DILAUDID) 2 MG tablet Take 2 mg by mouth 2 (two) times daily as needed for moderate pain or severe pain.     [name] Kitchen insulin lispro (HUMALOG) 100 UNIT/ML injection Inject 75-120 Units into the skin continuous. Per insulin pump     . levothyroxine (SYNTHROID, LEVOTHROID) 112 MCG tablet Take 112 mcg by mouth at bedtime.     [name] Kitchen loperamide (IMODIUM A-D) 2 MG tablet Take 2-4 mg by mouth 4 (four) times daily as needed for diarrhea or loose stools.    . Melatonin 5 MG TABS Take 5 mg by mouth at bedtime.    . midodrine (PROAMATINE) 10 MG tablet Take 1 tablet (10 mg total) by mouth 3 (three) times daily. Takes 1 tab in mornings of dialysis and takes 2 tabs 30 mins before starting dialysis (m,w,f)    . nitroGLYCERIN (NITROSTAT) 0.4 MG SL tablet Place 0.4 mg under the tongue every 5 (five) minutes as needed for chest pain.    Vladimir Faster Glycol-Propyl Glycol (SYSTANE ULTRA PF OP) Place 1 drop into both eyes every 2 (two) hours as needed (dry eyes).    Vladimir Faster Glycol-Propyl Glycol 0.4-0.3 % SOLN Place 1 drop into both eyes 4 (four) times daily.    [name] Kitchen  polyethylene glycol (MIRALAX / GLYCOLAX) packet Take 17 g by mouth daily. 14 each 0  . prasugrel (EFFIENT) 10 MG TABS tablet Take 1 tablet (10 mg total) by mouth daily. Please keep upcoming appt in January 2022 before anymore refills. Thank you 90 tablet 0  . promethazine (PHENERGAN) 25 MG tablet Take 25 mg by mouth every 6 (six) hours as needed for nausea or vomiting.    [name] Kitchen rOPINIRole (REQUIP) 2 MG tablet Take 2 mg by mouth at bedtime.    . sevelamer carbonate (RENVELA) 800 MG tablet Take 800 mg by mouth 3 (three) times daily with meals.    [name] Kitchen testosterone cypionate (DEPOTESTOSTERONE CYPIONATE) 200 MG/ML injection Inject 200 mg into the muscle every 14 (fourteen) days.   1    Objective: BP (!) 160/76   Pulse 85   Temp 98.1 F (36.7 C) (Oral)   Resp 18   SpO2 100%  Exam: General: *** Eyes: *** ENTM: *** Neck: *** Cardiovascular: *** Respiratory: labored breathing *** Gastrointestinal: *** MSK: *** Derm: *** Neuro: *** Psych: ***  Labs and Imaging: CBC BMET  Recent Labs  Lab 05/20/20 1527  WBC 7.4  HGB 8.8*  HCT 29.9*  PLT 293   Recent Labs  Lab 05/20/20 1527   NA 133*  K 4.5  CL 92*  CO2 29  BUN 40*  CREATININE 9.56*  GLUCOSE 212*  CALCIUM 8.1*     DG Chest 2 View  Result Date: 05/20/2020 CLINICAL DATA:  Shortness of breath EXAM: CHEST - 2 VIEW COMPARISON:  05/08/2020 FINDINGS: Right IJ approach dialysis catheter tip is at the right atrium. Low lung volumes. Probable mild interstitial edema similar to prior study. No significant pleural effusion. No pneumothorax. Stable cardiomediastinal contours. No acute osseous abnormality. IMPRESSION: Probable mild interstitial edema. Electronically Signed   By: Macy Mis M.D.   On: 05/20/2020 14:55     Zola Button, MD 05/20/2020, 10:01 PM PGY-1, [facility] Intern pager: [phone], text pages welcome

## 2020-05-20 NOTE — ED Provider Notes (Signed)
[facility] EMERGENCY DEPARTMENT Provider Note   CSN: [id number] Arrival date & time: 05/20/20  1404     History Chief Complaint  Patient presents with  . Shortness of Breath    Dale [name] is a 55 y.o. male.  Dale [name] is a 55 y.o. male with extensive past medical history including ESRD on HD, heart failure, CAD, gastroparesis, GERD, fibromyalgia, hypothyroidism, sleep apnea, peripheral arterial disease s/p left BKA, diabetes, who presents to the emergency department for evaluation of worsening shortness of breath.  Patient states that he was diagnosed with Covid here in the ED on 1/14, had initially plan for admission at this time but due to impending snowstorm patient wanted to go home.  He was treated as an outpatient with remdesivir and new antiviral treatment and reports that he felt initially he was getting better but over the past 3 days felt like he was worsening again.  He has had worsening shortness of breath.  Patient reports that he has oxygen at home that he wears at night for his sleep apnea, because he could not tolerate any of the CPAP machines.  He states that when he got sick in December with pneumonia he was using his oxygen more frequently at home and then that seemed to get better, he has used it again some with COVID but has needed it constantly over the past 3 days at a minimum of 3 L but has sometimes felt like he needed to turn the oxygen up higher, up to 6 L but this started to make him feel a bit odd so he turned it back down and decided to come in for evaluation.  He reports an occasional cough with phlegm.  States he has had nausea and poor appetite but no vomiting or diarrhea no abdominal pain.  Reports chest tightness associated with the shortness of breath but no persistent chest pain.  No known fevers.  No lower extremity swelling.  Was supposed to go to dialysis today but missed his treatment because he came here due to these worsening  symptoms, was last dialyzed on Monday.  Concerned that the symptoms do not seem to be improving at home.  Aggravating or alleviating factors.        Past Medical History:  Diagnosis Date  . (HFpEF) heart failure with preserved ejection fraction ([place])    a. Echo 3/13:  EF 55-60%;  b. Echo (11/15):  Mild LVH, EF 60-65%, no RWMA, Gr 1 DD, MAC, mild LAE, normal RVF, mild RAE;  c.  Echo 5/16:  severe LVH, EF 55-60%, no RWMA, Gr 1 DD, MAC, mild LAE // Echo 7/18: EF 60-65, Gr 1 DD  . Anal fissure   . Anemia of chronic disease   . Arthritis    "hands, right knee" (03/19/2014) no cartlidge in right knee  . CAD (coronary artery disease)    a. Abnl nuc ->LHC (11/15):  mid to dist Dx 80%, mid RCA 99% (functional CTO), dist RCA 80% >> PCI: balloon angioplasty to mid RCA (could not deliver stent) - staged PCI Rotoblator atherectomy/DES to [facility]. b. NSTEMI 12/2015 s/p DES to diagonal.  . Cholecystitis    a. 08/27/2011  . Claustrophobia    when things get around his face.   . Complication of anesthesia   . Difficult intubation    only once in 2015 at [facility] haven't had a problem since then  . Diverticulosis   . Esophageal dysmotility   .  ESRD (end stage renal disease) ([place])    a. 1995 s/p cadaveric transplant w/ susbequent failure after 18 yrs;  b.Dialysis initiated 07/2011 [facility] M-W-F  . Fibromyalgia   . Gastroparesis   . GERD (gastroesophageal reflux disease)   . Gout    PMH  . History of blood transfusion   . History of COVID-19 04/2020   tx with sotrovimab (mAb) and molnupiravir (nucleoside analog)  . History of kidney stones    early 1990's  . HLD (hyperlipidemia)   . Hypertension   . Hypothyroidism   . Inguinal lymphadenopathy    a. bilateral - s/p biopsy 07/2011  . Insulin dependent diabetes mellitus    Type I  . Monilial esophagitis ([place])   . Morbid obesity ([place])   . OSA (obstructive sleep apnea)    adjustable bed and uses O2 L at bedtime  . PAD (peripheral artery  disease) ([facility])    a. s/p L BKA.  [name] Kitchen Pericardial effusion    a.  Small by CT 08/22/11;  b.  Large by CT 08/27/11. c. Not seen on 11/2015 echo.  . Pneumonia ~ 2007  . PONV (postoperative nausea and vomiting)    vomited once after a procedure.  . T1DM (type 1 diabetes mellitus) ([place])   . Tracheobronchomalacia     Patient Active Problem List   Diagnosis Date Noted  . Dyspnea 05/08/2020  . Multifocal pneumonia 05/05/2020  . Lymph node enlargement 08/13/2018  . Clotted renal dialysis arteriovenous graft ([place]) 08/02/2018  . Pressure injury of skin 08/02/2018  . Upper airway cough syndrome 08/11/2017  . Morbid obesity due to excess calories ([place]) 08/11/2017  . Chronic respiratory failure with hypoxia ([place]) 08/10/2017  . GERD (gastroesophageal reflux disease) 07/29/2017  . Rash and nonspecific skin eruption 07/27/2017  . Contact dermatitis 07/27/2017  . ESRD on hemodialysis ([place])   . Sepsis ([place]) 07/12/2017  . T1DM (type 1 diabetes mellitus) ([place]) 07/12/2017  . Hypotension (arterial) 10/15/2016  . Heme positive stool   . IDDM with complications 17/91/5056  . Oxygen dependent 03/24/2016  . Visit for preventive health examination 03/24/2016  . Chronic diastolic heart failure ([place]) 02/25/2016  . Tracheobronchomalacia 02/04/2016  . Anemia of chronic disease   . Iron deficiency anemia   . Gastrointestinal hemorrhage with melena 12/21/2015  . Pulmonary edema 03/09/2015  . Chronic pain 03/09/2015  . Hyperkalemia 03/09/2015  . HLD (hyperlipidemia) 11/27/2014  . ESRD on dialysis ([place]) 09/30/2014  . Coronary artery disease involving native coronary artery of native heart with unstable angina pectoris ([place]) 03/20/2014  . Allergic reaction to dye 03/20/2014  . NSTEMI (non-ST elevated myocardial infarction) ([place]) 03/19/2014  . HTN (hypertension) 02/18/2014  . ESRD (end stage renal disease) ([place]) 08/27/2011  . Abdominal pain 08/27/2011  . Pericardial effusion 08/27/2011  . History of renal  transplant 07/22/2011  . S/P BKA (below knee amputation) ([place]) 07/22/2011  . OSA (obstructive sleep apnea) 07/22/2011  . DOE (dyspnea on exertion) 07/22/2011    Past Surgical History:  Procedure Laterality Date  . A/V FISTULAGRAM Left 07/26/2016   Procedure: A/V Fistulagram;  Surgeon: Waynetta Sandy, MD;  Location: [place] CV LAB;  Service: Cardiovascular;  Laterality: Left;  . A/V FISTULAGRAM N/A 07/11/2017   Procedure: A/V FISTULAGRAM - Left AV;  Surgeon: Waynetta Sandy, MD;  Location: [place] [facility];  Service: Cardiovascular;  Laterality: N/A;  . AMPUTATION OF REPLICATED TOES Right   . ANGIOPLASTY  04/09/2014   RCA   .  APPENDECTOMY  ~ 2006  . ARTERIOVENOUS GRAFT PLACEMENT Left 1993?   forearm  . ARTERIOVENOUS GRAFT PLACEMENT Right 1993?   leg  . ARTERIOVENOUS GRAFT PLACEMENT Left    Thigh  . Arteriovenous Graft Removed Left    Thigh  . AV FISTULA PLACEMENT  07/29/2011   Procedure: ARTERIOVENOUS (AV) FISTULA CREATION;  Surgeon: Mal Misty, MD;  Location: [place];  Service: Vascular;  Laterality: Right;  Brachial cephalic  . AV FISTULA PLACEMENT Left 01/20/2015   Procedure: LIGATION OF RIGHT ARTERIOVENOUS FISTULA WITH EXCISION OF ANEURYSM;  Surgeon: Angelia Mould, MD;  Location: [place];  Service: Vascular;  Laterality: Left;  . AV FISTULA PLACEMENT Left 04/30/2015   Procedure: CREATION OF LEFT ARM BRACHIO-CEPHALIC ARTERIOVENOUS (AV) FISTULA ;  Surgeon: Angelia Mould, MD;  Location: [facility];  Service: Vascular;  Laterality: Left;  . AV Graft PLACEMENT Left 1993?   "attempted one in my wrist; didn't take"  . BASCILIC VEIN TRANSPOSITION Left 11/01/2018   Procedure: FIRST STAGE BASILIC VEIN TRANSPOSITION  LEFT ARM;  Surgeon: Serafina Mitchell, MD;  Location: [facility];  Service: Vascular;  Laterality: Left;  . BASCILIC VEIN TRANSPOSITION Left 12/27/2018   Procedure: SECOND STAGE BASILIC VEIN TRANSPOSITION  LEFT ARM;  Surgeon: Serafina Mitchell, MD;  Location:  [place];  Service: Vascular;  Laterality: Left;  . BELOW KNEE LEG AMPUTATION Left 2010  . CARDIAC CATHETERIZATION  03/19/2014  . CARDIAC CATHETERIZATION  03/19/2014   Procedure: CORONARY BALLOON ANGIOPLASTY;  Surgeon: Burnell Blanks, MD;  Location: [facility] CATH LAB;  Service: Cardiovascular;;  . CARDIAC CATHETERIZATION N/A 01/01/2016   Procedure: Left Heart Cath and Coronary Angiography;  Surgeon: Troy Sine, MD;  Location: [place] CV LAB;  Service: Cardiovascular;  Laterality: N/A;  . CARDIAC CATHETERIZATION N/A 01/01/2016   Procedure: Coronary Stent Intervention;  Surgeon: Troy Sine, MD;  Location: [place] CV LAB;  Service: Cardiovascular;  Laterality: N/A;  . CARPAL TUNNEL RELEASE Bilateral after 2005  . CATARACT EXTRACTION W/ INTRAOCULAR LENS  IMPLANT, BILATERAL Bilateral 1990's  . COLONOSCOPY  08/29/2011   Procedure: COLONOSCOPY;  Surgeon: Jerene Bears, MD;  Location: [place];  Service: Gastroenterology;  Laterality: N/A;  . COLONOSCOPY WITH PROPOFOL N/A 04/26/2016   Procedure: COLONOSCOPY WITH PROPOFOL;  Surgeon: Jerene Bears, MD;  Location: [facility] ENDOSCOPY;  Service: Gastroenterology;  Laterality: N/A;  . CORONARY ANGIOPLASTY WITH STENT PLACEMENT  04/09/2014       ptca/des mid lad   . ESOPHAGOGASTRODUODENOSCOPY N/A 12/25/2015   Procedure: ESOPHAGOGASTRODUODENOSCOPY (EGD);  Surgeon: Mauri Pole, MD;  Location: [facility] ENDOSCOPY;  Service: Endoscopy;  Laterality: N/A;  bedside  . ESOPHAGOGASTRODUODENOSCOPY (EGD) WITH PROPOFOL N/A 04/26/2016   Procedure: ESOPHAGOGASTRODUODENOSCOPY (EGD) WITH PROPOFOL;  Surgeon: Jerene Bears, MD;  Location: [facility];  Service: Gastroenterology;  Laterality: N/A;  . EYE SURGERY Bilateral    cataract  . FEMORAL ARTERY REPAIR  04/09/2014   ANGIOSEAL  . INSERTION OF DIALYSIS CATHETER  08/01/2011   Procedure: INSERTION OF DIALYSIS CATHETER;  Surgeon: Rosetta Posner, MD;  Location: [place];  Service: Vascular;  Laterality: Right;  insertion of dialysis  catheter on right internal jugular vein  . INSERTION OF DIALYSIS CATHETER Right 01/20/2015   Procedure: INSERTION OF DIALYSIS CATHETER - RIGHT INTERNAL JUGULAR ;  Surgeon: Angelia Mould, MD;  Location: [facility];  Service: Vascular;  Laterality: Right;  . INSERTION OF DIALYSIS CATHETER Right 08/02/2018   Procedure: Insertion Of Dialysis Catheter;  Surgeon: Angelia Mould, MD;  Location: [facility] OR;  Service: Vascular;  Laterality: Right;  . KIDNEY TRANSPLANT  04/25/1993  . LEFT HEART CATHETERIZATION WITH CORONARY ANGIOGRAM N/A 03/19/2014   Procedure: LEFT HEART CATHETERIZATION WITH CORONARY ANGIOGRAM;  Surgeon: Burnell Blanks, MD;  Location: [facility] CATH LAB;  Service: Cardiovascular;  Laterality: N/A;  . LIGATION OF ARTERIOVENOUS  FISTULA Left 11/01/2018   Procedure: Ligation Of  Brachio Cephalic Arteriovenous  Fistula;  Surgeon: Serafina Mitchell, MD;  Location: [facility];  Service: Vascular;  Laterality: Left;  . LIGATION OF ARTERIOVENOUS  FISTULA Left 02/19/2019   Procedure: LIGATION OF ARTERIOVENOUS  FISTULA LEFT ARM;  Surgeon: Angelia Mould, MD;  Location: [place];  Service: Vascular;  Laterality: Left;  . LYMPH NODE BIOPSY  08/10/2011   Procedure: LYMPH NODE BIOPSY;  Surgeon: Merrie Roof, MD;  Location: [place];  Service: General;  Laterality: Left;  left groin lymph biopsy  . NEUROPLASTY / TRANSPOSITION ULNAR NERVE AT ELBOW Left after 2005  . PATCH ANGIOPLASTY Left 08/09/2016   Procedure: LEFT  ARTERIOVENOUS FISTULA PATCH ANGIOPLASTY;  Surgeon: Waynetta Sandy, MD;  Location: [place];  Service: Vascular;  Laterality: Left;  . PERCUTANEOUS CORONARY ROTOBLATOR INTERVENTION (PCI-R) N/A 04/09/2014   Procedure: PERCUTANEOUS CORONARY ROTOBLATOR INTERVENTION (PCI-R);  Surgeon: Burnell Blanks, MD;  Location: [facility] CATH LAB;  Service: Cardiovascular;  Laterality: N/A;  . PERIPHERAL VASCULAR BALLOON ANGIOPLASTY  07/11/2017   Procedure: PERIPHERAL VASCULAR BALLOON ANGIOPLASTY;   Surgeon: Waynetta Sandy, MD;  Location: [place] [facility] LAB;  Service: Cardiovascular;;  left arm AV fistula  . PERIPHERAL VASCULAR CATHETERIZATION N/A 12/25/2014   Procedure: Upper Extremity Venography;  Surgeon: Conrad [place], MD;  Location: [place] [facility];  Service: Cardiovascular;  Laterality: N/A;  . PERIPHERAL VASCULAR CATHETERIZATION Left 02/24/2016   Procedure: Fistulagram;  Surgeon: Waynetta Sandy, MD;  Location: [place] [facility] LAB;  Service: Cardiovascular;  Laterality: Left;  . PERIPHERAL VASCULAR CATHETERIZATION Left 02/24/2016   Procedure: Peripheral Vascular Intervention;  Surgeon: Waynetta Sandy, MD;  Location: [place] [facility];  Service: Cardiovascular;  Laterality: Left;  AV FISTULA  . PERITONEAL CATHETER INSERTION    . PERITONEAL CATHETER REMOVAL    . REVISON OF ARTERIOVENOUS FISTULA Right 4/33/2951   Procedure: PLICATION / REVISION OF ARTERIOVENOUS FISTULA;  Surgeon: Angelia Mould, MD;  Location: [place];  Service: Vascular;  Laterality: Right;  . REVISON OF ARTERIOVENOUS FISTULA Left 09/01/2015   Procedure: SUPERFICIALIZATION OF LEFT ARM BRACHIOCEPHALIC ARTERIOVENOUS FISTULA;  Surgeon: Angelia Mould, MD;  Location: [place];  Service: Vascular;  Laterality: Left;  . REVISON OF ARTERIOVENOUS FISTULA Left 08/09/2016   Procedure: REVISION OF ARTERIAL ANASTOMOSIS OF ARTERIOVENOUS FISTULA;  Surgeon: Waynetta Sandy, MD;  Location: [place];  Service: Vascular;  Laterality: Left;  . SHOULDER ARTHROSCOPY Left ~ 2006   frozen  . THROMBECTOMY W/ EMBOLECTOMY Left 08/02/2018   Procedure: THROMBECTOMY ARTERIOVENOUS FISTULA;  Surgeon: Angelia Mould, MD;  Location: [place];  Service: Vascular;  Laterality: Left;  . TOE AMPUTATION Bilateral after 2005  . UMBILICAL HERNIA REPAIR  1990's  . UPPER EXTREMITY VENOGRAPHY Bilateral 10/16/2018   Procedure: UPPER EXTREMITY VENOGRAPHY;  Surgeon: Serafina Mitchell, MD;  Location: [place] CV LAB;   Service: Cardiovascular;  Laterality: Bilateral;  . VENOGRAM Right 07/28/2011   Procedure: VENOGRAM;  Surgeon: Conrad [place], MD;  Location: [facility] CATH LAB;  Service: Cardiovascular;  Laterality: Right;  . VITRECTOMY Bilateral  Family History  Problem Relation Age of Onset  . Colon polyps Father   . Diabetes Father   . Hypertension Father   . Hypertension Mother   . Diabetes Mother   . Hyperlipidemia Mother   . Breast cancer Maternal Aunt   . Bone cancer Maternal Aunt   . Liver cancer Maternal Aunt   . Malignant hyperthermia Neg Hx   . Heart attack Neg Hx   . Stroke Neg Hx   . Colon cancer Neg Hx   . Stomach cancer Neg Hx   . Pancreatic cancer Neg Hx   . Esophageal cancer Neg Hx     Social History   Tobacco Use  . Smoking status: Never Smoker  . Smokeless tobacco: Never Used  Vaping Use  . Vaping Use: Never used  Substance Use Topics  . Alcohol use: Yes    Comment: rare  . Drug use: No    Home Medications Prior to Admission medications   Medication Sig Start Date End Date Taking? Authorizing Provider  acetaminophen (TYLENOL) 500 MG tablet Take 1,000 mg by mouth every 6 (six) hours as needed (pain/headaches.).     [provider]  allopurinol (ZYLOPRIM) 100 MG tablet TAKE 2 TABLETS BY MOUTH DAILY 05/23/19   Bonnita Hollow, MD  aspirin EC 81 MG tablet Take 81 mg by mouth at bedtime.    [provider]  atorvastatin (LIPITOR) 20 MG tablet Take 20 mg by mouth daily. 02/12/19   [provider]  B Complex-C-Folic Acid (DIALYVITE 191) 0.8 MG TABS Take 1 tablet by mouth daily.    [provider]  calcium acetate (PHOSLO) 667 MG capsule Take by mouth See admin instructions. Take 3 Capsules by mouth with each meal daily 09/19/12   [provider]  cetirizine (ZYRTEC) 10 MG tablet Take 10 mg by mouth daily.    [provider]  diphenhydrAMINE (BENADRYL) 25 MG tablet Take 50 mg by mouth at bedtime.    [provider]  docusate sodium (COLACE) 100 MG capsule Take 100 mg by mouth 2 (two) times daily.     [provider]  famotidine (PEPCID) 20 MG tablet Take 20 mg by mouth 2 (two) times daily.    [provider]  fluticasone (FLONASE) 50 MCG/ACT nasal spray Place 2 sprays into both nostrils daily as needed for allergies.     [provider]  gabapentin (NEURONTIN) 300 MG capsule Take 300 mg by mouth 2 (two) times daily.    [provider]  GLUCAGON EMERGENCY 1 MG injection Inject 1 mg into the muscle daily as needed (low blood sugar).  01/19/12   [provider]  Glucose-Cholecalciferol (RELION GLUCOSE SHOT) LIQD Take 1 Dose by mouth daily as needed (low blood sugar).    [provider]  hydrocortisone 2.5 % cream Apply 1 application topically 2 (two) times daily as needed (rash / itching).    [provider]  HYDROmorphone (DILAUDID) 2 MG tablet Take 2 mg by mouth 2 (two) times daily as needed for moderate pain or severe pain.     [provider]  insulin lispro (HUMALOG) 100 UNIT/ML injection Inject 75-120 Units into the skin continuous. Per insulin pump    [provider]  levothyroxine (SYNTHROID, LEVOTHROID) 112 MCG tablet Take 112 mcg by mouth at bedtime.     [provider]  loperamide (IMODIUM A-D) 2 MG tablet Take 2-4 mg by mouth 4 (four) times daily as needed for  diarrhea or loose stools.    [provider]  Melatonin 5 MG TABS Take 5 mg by mouth at bedtime.    [provider]  midodrine (PROAMATINE) 10 MG tablet Take 1 tablet (10 mg total) by mouth 3 (three) times daily. Takes 1 tab in mornings of dialysis and takes 2 tabs 30 mins before starting dialysis (m,w,f) 04/14/16   Elgergawy, Silver Huguenin, MD  nitroGLYCERIN (NITROSTAT) 0.4 MG SL tablet Place 0.4 mg under the tongue every 5 (five) minutes as needed for chest pain.    [provider]  Polyethyl Glycol-Propyl Glycol (SYSTANE ULTRA PF  OP) Place 1 drop into both eyes every 2 (two) hours as needed (dry eyes).    [provider]  Polyethyl Glycol-Propyl Glycol 0.4-0.3 % SOLN Place 1 drop into both eyes 4 (four) times daily.    [provider]  polyethylene glycol (MIRALAX / GLYCOLAX) packet Take 17 g by mouth daily. 02/06/16   Regalado, Belkys A, MD  prasugrel (EFFIENT) 10 MG TABS tablet Take 1 tablet (10 mg total) by mouth daily. Please keep upcoming appt in January 2022 before anymore refills. Thank you 03/02/20   Burnell Blanks, MD  promethazine (PHENERGAN) 25 MG tablet Take 25 mg by mouth every 6 (six) hours as needed for nausea or vomiting.    [provider]  rOPINIRole (REQUIP) 2 MG tablet Take 2 mg by mouth at bedtime.    [provider]  sevelamer carbonate (RENVELA) 800 MG tablet Take 800 mg by mouth 3 (three) times daily with meals.    [provider]  testosterone cypionate (DEPOTESTOSTERONE CYPIONATE) 200 MG/ML injection Inject 200 mg into the muscle every 14 (fourteen) days.  09/10/16   [provider]    Allergies    Iodinated diagnostic agents, Prednisone, Adhesive [tape], Amoxicillin, Clindamycin/lincomycin, Enalapril, Mobic [meloxicam], Morphine, Brilinta [ticagrelor], Ciprofloxacin, Clopidogrel, Codeine, Doxycycline, Hydrocodone, Levofloxacin, Oxycodone, and Rocephin [ceftriaxone]  Review of Systems   Review of Systems  Constitutional: Positive for appetite change and fatigue. Negative for chills and fever.  HENT: Negative.   Respiratory: Positive for cough, chest tightness and shortness of breath.   Cardiovascular: Negative for chest pain and leg swelling.  Gastrointestinal: Positive for nausea. Negative for abdominal pain, diarrhea and vomiting.  Genitourinary: Negative for dysuria and frequency.  Musculoskeletal: Positive for myalgias.  Skin: Negative for color change, rash and wound.  Neurological: Negative for dizziness, syncope and  light-headedness.  All other systems reviewed and are negative.   Physical Exam Updated Vital Signs BP (!) 169/84 (BP Location: Left Arm)   Pulse 88   Temp 98.1 F (36.7 C) (Oral)   Resp (!) 21   SpO2 100%   Physical Exam Vitals and nursing note reviewed.  Constitutional:      General: He is not in acute distress.    Appearance: He is well-developed and well-nourished. He is obese. He is ill-appearing. He is not diaphoretic.     Comments: Alert, obese male, ill-appearing but not in acute distress  HENT:     Head: Normocephalic and atraumatic.     Mouth/Throat:     Mouth: Oropharynx is clear and moist.     Pharynx: Oropharynx is clear.  Eyes:     General:        Right eye: No discharge.        Left eye: No discharge.     Extraocular Movements: EOM normal.  Cardiovascular:     Rate and Rhythm: Normal rate  and regular rhythm.     Pulses: Intact distal pulses.     Heart sounds: Normal heart sounds.  Pulmonary:     Effort: Tachypnea present. No respiratory distress.     Breath sounds: Decreased breath sounds and rales present.     Comments: On 3 L nasal cannula patient is tachypneic with slightly increased work of breathing, able to speak in short sentences, with any movement or activity patient becomes winded, on auscultation breath sounds are diminished bilaterally with some faint rales noted in the bases, no wheezes or rhonchi. Chest:     Chest wall: No tenderness.  Abdominal:     General: Bowel sounds are normal. There is no distension.     Palpations: Abdomen is soft. There is no mass.     Tenderness: There is no abdominal tenderness. There is no guarding.     Comments: Abdomen soft, nondistended, nontender to palpation in all quadrants without guarding or peritoneal signs   Musculoskeletal:        General: No deformity or edema.     Cervical back: Neck supple.     Comments: Left BKA  Skin:    General: Skin is warm and dry.     Capillary Refill: Capillary refill  takes less than 2 seconds.  Neurological:     Mental Status: He is alert.     Coordination: Coordination normal.     Comments: Speech is clear, able to follow commands Moves extremities without ataxia, coordination intact  Psychiatric:        Mood and Affect: Mood normal.        Behavior: Behavior normal.     ED Results / Procedures / Treatments   Labs (all labs ordered are listed, but only abnormal results are displayed) Labs Reviewed  CBC WITH DIFFERENTIAL/PLATELET - Abnormal; Notable for the following components:      Result Value   RBC 2.82 (*)    Hemoglobin 8.8 (*)    HCT 29.9 (*)    MCV 106.0 (*)    MCHC 29.4 (*)    Eosinophils Absolute 0.7 (*)    All other components within normal limits  COMPREHENSIVE METABOLIC PANEL - Abnormal; Notable for the following components:   Sodium 133 (*)    Chloride 92 (*)    Glucose, Bld 212 (*)    BUN 40 (*)    Creatinine, Ser 9.56 (*)    Calcium 8.1 (*)    Albumin 3.0 (*)    AST 12 (*)    GFR, Estimated 6 (*)    All other components within normal limits  D-DIMER, QUANTITATIVE (NOT AT [facility]) - Abnormal; Notable for the following components:   D-Dimer, Quant 0.87 (*)    All other components within normal limits  FERRITIN - Abnormal; Notable for the following components:   Ferritin 1,030 (*)    All other components within normal limits  FIBRINOGEN - Abnormal; Notable for the following components:   Fibrinogen 670 (*)    All other components within normal limits  C-REACTIVE PROTEIN - Abnormal; Notable for the following components:   CRP 12.9 (*)    All other components within normal limits  CULTURE, BLOOD (ROUTINE X 2)  CULTURE, BLOOD (ROUTINE X 2)  LACTIC ACID, PLASMA  PROCALCITONIN  LACTATE DEHYDROGENASE  TRIGLYCERIDES  LACTIC ACID, PLASMA    EKG EKG Interpretation  Date/Time:  Wednesday May 20 2020 14:20:14 EST Ventricular Rate:  85 PR Interval:  140 QRS Duration: 84 QT Interval:  364  QTC Calculation: 433 R  Axis:   83 Text Interpretation: Normal sinus rhythm Normal ECG When compared with ECG of 05/08/2020, No significant change was found Confirmed by Delora Fuel ([id number]) on 05/20/2020 11:21:11 PM   Radiology DG Chest 2 View  Result Date: 05/20/2020 CLINICAL DATA:  Shortness of breath EXAM: CHEST - 2 VIEW COMPARISON:  05/08/2020 FINDINGS: Right IJ approach dialysis catheter tip is at the right atrium. Low lung volumes. Probable mild interstitial edema similar to prior study. No significant pleural effusion. No pneumothorax. Stable cardiomediastinal contours. No acute osseous abnormality. IMPRESSION: Probable mild interstitial edema. Electronically Signed   By: Macy Mis M.D.   On: 05/20/2020 14:55    Procedures .Critical Care Performed by: Jacqlyn Larsen, PA-C Authorized by: Jacqlyn Larsen, PA-C   Critical care provider statement:    Critical care time (minutes):  45   Critical care was necessary to treat or prevent imminent or life-threatening deterioration of the following conditions:  Respiratory failure (Acute hypoxic respiratory failure due to COVID.)   Critical care was time spent personally by me on the following activities:  Discussions with consultants, evaluation of patient's response to treatment, examination of patient, ordering and performing treatments and interventions, ordering and review of laboratory studies, ordering and review of radiographic studies, pulse oximetry, re-evaluation of patient's condition, obtaining history from patient or surrogate and review of old charts     Medications Ordered in ED Medications  hydrocortisone sodium succinate (SOLU-CORTEF) injection 200 mg (200 mg Intravenous Given 05/20/20 1803)  diphenhydrAMINE (BENADRYL) capsule 50 mg ( Oral See Alternative 05/20/20 2139)    Or  diphenhydrAMINE (BENADRYL) injection 50 mg (50 mg Intravenous Given 05/20/20 2139)  LORazepam (ATIVAN) injection 0.5 mg (0.5 mg Intravenous Given 05/20/20 2137)  remdesivir  200 mg in sodium chloride 0.9% 250 mL IVPB (0 mg Intravenous Stopped 05/21/20 0107)  iohexol (OMNIPAQUE) 350 MG/ML injection 80 mL (80 mLs Intravenous Contrast Given 05/20/20 2253)    ED Course  I have reviewed the triage vital signs and the nursing notes.  Pertinent labs & imaging results that were available during my care of the patient were reviewed by me and considered in my medical decision making (see chart for details).    MDM Rules/Calculators/A&P                          55 year old male with numerous comorbidities, vaccinated and boosted, but still contracted COVID-19 is on day 12 of symptoms, presents to the ED today due to worsening shortness of breath.  Patient received a monoclonal antibody infusion and course of oral antivirals for Covid shortly after he was diagnosed and initially felt like he was improving, but over the past 2 to 3 days he has had worsening shortness of breath.  He has oxygen at home which she is only supposed to wear at night, but he has been wearing this oxygen at all times due to worsening shortness of breath and over the past few days has felt the need to turn the oxygen up intermittently as high as 6 L.  On arrival on 3 L patient satting in the 90s, he has some rales in the bases and some decreased breath sounds, is not in any acute respiratory distress.  Patient states that he had bacterial pneumonia in December prior to contracting Covid and reports that he feels like his respiratory status has never fully improved.  Will get Covid labs and inflammatory markers, but  feel patient would also need CTA to rule out PE and better assess for potential pneumonia as chest x-ray here shows some signs of mild interstitial edema only.  Patient is also on dialysis, missed his dialysis appointment today due to these worsening symptoms.  I have independently ordered, reviewed and interpreted all labs and imaging: CBC, no leukocytosis, anemia with hemoglobin a bit worsened from  baseline at 8.8 but patient denies bleeding symptoms CMP: ESRD with creatinine of 9.56 on dialysis, did not have dialysis today but stable potassium of 4.5, normal LFTs Elevation of multiple Covid inflammatory markers.  Chest x-ray with probable mild interstitial edema.  CTA pending, but given worsening respiratory status now requiring at least 3 L at all times feel patient will require admission for acute hypoxic respiratory failure in the setting of Covid.  Patient will be going for CT shortly after premedication for contrast allergy. Patient started on remdesivir.  Will consult family medicine teaching service.  Case discussed with family medicine teaching service who will see and admit patient.  Dale [name] was evaluated in Emergency Department on 05/20/2020 for the symptoms described in the history of present illness. He was evaluated in the context of the global COVID-19 pandemic, which necessitated consideration that the patient might be at risk for infection with the SARS-CoV-2 virus that causes COVID-19. Institutional protocols and algorithms that pertain to the evaluation of patients at risk for COVID-19 are in a state of rapid change based on information released by regulatory bodies including the CDC and federal and state organizations. These policies and algorithms were followed during the patient's care in the ED.  Final Clinical Impression(s) / ED Diagnoses Final diagnoses:  Acute hypoxemic respiratory failure due to COVID-19 [facility])  ESRD on hemodialysis [facility])    Rx / DC Orders ED Discharge Orders    None       Janet Berlin 05/21/20 1454    Breck Coons, MD 05/21/20 1455

## 2020-05-20 NOTE — ED Provider Notes (Incomplete)
[facility] EMERGENCY DEPARTMENT Provider Note   CSN: [id number] Arrival date & time: 05/20/20  1404     History Chief Complaint  Patient presents with  . Shortness of Breath    Dale [name] is a 55 y.o. male.  HPI     Past Medical History:  Diagnosis Date  . (HFpEF) heart failure with preserved ejection fraction ([place])    a. Echo 3/13:  EF 55-60%;  b. Echo (11/15):  Mild LVH, EF 60-65%, no RWMA, Gr 1 DD, MAC, mild LAE, normal RVF, mild RAE;  c.  Echo 5/16:  severe LVH, EF 55-60%, no RWMA, Gr 1 DD, MAC, mild LAE // Echo 7/18: EF 60-65, Gr 1 DD  . Anal fissure   . Anemia of chronic disease   . Arthritis    "hands, right knee" (03/19/2014) no cartlidge in right knee  . CAD (coronary artery disease)    a. Abnl nuc ->LHC (11/15):  mid to dist Dx 80%, mid RCA 99% (functional CTO), dist RCA 80% >> PCI: balloon angioplasty to mid RCA (could not deliver stent) - staged PCI Rotoblator atherectomy/DES to [facility]. b. NSTEMI 12/2015 s/p DES to diagonal.  . Cholecystitis    a. 08/27/2011  . Claustrophobia    when things get around his face.   . Complication of anesthesia   . Difficult intubation    only once in 2015 at [facility] haven't had a problem since then  . Diverticulosis   . Esophageal dysmotility   . ESRD (end stage renal disease) ([place])    a. 1995 s/p cadaveric transplant w/ susbequent failure after 18 yrs;  b.Dialysis initiated 07/2011 [facility]  . Fibromyalgia   . Gastroparesis   . GERD (gastroesophageal reflux disease)   . Gout    PMH  . History of blood transfusion   . History of COVID-19 04/2020   tx with sotrovimab (mAb) and molnupiravir (nucleoside analog)  . History of kidney stones    early 1990's  . HLD (hyperlipidemia)   . Hypertension   . Hypothyroidism   . Inguinal lymphadenopathy    a. bilateral - s/p biopsy 07/2011  . Insulin dependent diabetes mellitus    Type I  . Monilial esophagitis ([place])   . Morbid obesity ([place])   . OSA  (obstructive sleep apnea)    adjustable bed and uses O2 L at bedtime  . PAD (peripheral artery disease) (HCC)    a. s/p L BKA.  [name] Kitchen Pericardial effusion    a.  Small by CT 08/22/11;  b.  Large by CT 08/27/11. c. Not seen on 11/2015 echo.  . Pneumonia ~ 2007  . PONV (postoperative nausea and vomiting)    vomited once after a procedure.  . T1DM (type 1 diabetes mellitus) ([place])   . Tracheobronchomalacia     Patient Active Problem List   Diagnosis Date Noted  . Dyspnea 05/08/2020  . Multifocal pneumonia 05/05/2020  . Lymph node enlargement 08/13/2018  . Clotted renal dialysis arteriovenous graft ([place]) 08/02/2018  . Pressure injury of skin 08/02/2018  . Upper airway cough syndrome 08/11/2017  . Morbid obesity due to excess calories ([place]) 08/11/2017  . Chronic respiratory failure with hypoxia ([place]) 08/10/2017  . GERD (gastroesophageal reflux disease) 07/29/2017  . Rash and nonspecific skin eruption 07/27/2017  . Contact dermatitis 07/27/2017  . ESRD on hemodialysis ([place])   . Sepsis ([place]) 07/12/2017  . T1DM (type 1 diabetes mellitus) ([place]) 07/12/2017  . Hypotension (arterial)  10/15/2016  . Heme positive stool   . IDDM with complications 69/67/8938  . Oxygen dependent 03/24/2016  . Visit for preventive health examination 03/24/2016  . Chronic diastolic heart failure ([place]) 02/25/2016  . Tracheobronchomalacia 02/04/2016  . Anemia of chronic disease   . Iron deficiency anemia   . Gastrointestinal hemorrhage with melena 12/21/2015  . Pulmonary edema 03/09/2015  . Chronic pain 03/09/2015  . Hyperkalemia 03/09/2015  . HLD (hyperlipidemia) 11/27/2014  . ESRD on dialysis ([place]) 09/30/2014  . Coronary artery disease involving native coronary artery of native heart with unstable angina pectoris ([place]) 03/20/2014  . Allergic reaction to dye 03/20/2014  . NSTEMI (non-ST elevated myocardial infarction) ([place]) 03/19/2014  . HTN (hypertension) 02/18/2014  . ESRD (end stage renal disease) ([place])  08/27/2011  . Abdominal pain 08/27/2011  . Pericardial effusion 08/27/2011  . History of renal transplant 07/22/2011  . S/P BKA (below knee amputation) ([place]) 07/22/2011  . OSA (obstructive sleep apnea) 07/22/2011  . DOE (dyspnea on exertion) 07/22/2011    Past Surgical History:  Procedure Laterality Date  . A/V FISTULAGRAM Left 07/26/2016   Procedure: A/V Fistulagram;  Surgeon: Waynetta Sandy, MD;  Location: [facility];  Service: Cardiovascular;  Laterality: Left;  . A/V FISTULAGRAM N/A 07/11/2017   Procedure: A/V FISTULAGRAM - Left AV;  Surgeon: Waynetta Sandy, MD;  Location: [place] [facility];  Service: Cardiovascular;  Laterality: N/A;  . AMPUTATION OF REPLICATED TOES Right   . ANGIOPLASTY  04/09/2014   RCA   . APPENDECTOMY  ~ 2006  . ARTERIOVENOUS GRAFT PLACEMENT Left 1993?   forearm  . ARTERIOVENOUS GRAFT PLACEMENT Right 1993?   leg  . ARTERIOVENOUS GRAFT PLACEMENT Left    Thigh  . Arteriovenous Graft Removed Left    Thigh  . AV FISTULA PLACEMENT  07/29/2011   Procedure: ARTERIOVENOUS (AV) FISTULA CREATION;  Surgeon: Mal Misty, MD;  Location: [facility];  Service: Vascular;  Laterality: Right;  Brachial cephalic  . AV FISTULA PLACEMENT Left 01/20/2015   Procedure: LIGATION OF RIGHT ARTERIOVENOUS FISTULA WITH EXCISION OF ANEURYSM;  Surgeon: Angelia Mould, MD;  Location: [facility];  Service: Vascular;  Laterality: Left;  . AV FISTULA PLACEMENT Left 04/30/2015   Procedure: CREATION OF LEFT ARM BRACHIO-CEPHALIC ARTERIOVENOUS (AV) FISTULA ;  Surgeon: Angelia Mould, MD;  Location: [facility];  Service: Vascular;  Laterality: Left;  . AV Graft PLACEMENT Left 1993?   "attempted one in my wrist; didn't take"  . BASCILIC VEIN TRANSPOSITION Left 11/01/2018   Procedure: FIRST STAGE BASILIC VEIN TRANSPOSITION  LEFT ARM;  Surgeon: Serafina Mitchell, MD;  Location: [place];  Service: Vascular;  Laterality: Left;  . BASCILIC VEIN TRANSPOSITION Left 12/27/2018    Procedure: SECOND STAGE BASILIC VEIN TRANSPOSITION  LEFT ARM;  Surgeon: Serafina Mitchell, MD;  Location: [place] [facility];  Service: Vascular;  Laterality: Left;  . BELOW KNEE LEG AMPUTATION Left 2010  . CARDIAC CATHETERIZATION  03/19/2014  . CARDIAC CATHETERIZATION  03/19/2014   Procedure: CORONARY BALLOON ANGIOPLASTY;  Surgeon: Burnell Blanks, MD;  Location: [facility] CATH LAB;  Service: Cardiovascular;;  . CARDIAC CATHETERIZATION N/A 01/01/2016   Procedure: Left Heart Cath and Coronary Angiography;  Surgeon: Troy Sine, MD;  Location: [facility] CV LAB;  Service: Cardiovascular;  Laterality: N/A;  . CARDIAC CATHETERIZATION N/A 01/01/2016   Procedure: Coronary Stent Intervention;  Surgeon: Troy Sine, MD;  Location: [place] CV LAB;  Service: Cardiovascular;  Laterality: N/A;  .  CARPAL TUNNEL RELEASE Bilateral after 2005  . CATARACT EXTRACTION W/ INTRAOCULAR LENS  IMPLANT, BILATERAL Bilateral 1990's  . COLONOSCOPY  08/29/2011   Procedure: COLONOSCOPY;  Surgeon: Jerene Bears, MD;  Location: [facility];  Service: Gastroenterology;  Laterality: N/A;  . COLONOSCOPY WITH PROPOFOL N/A 04/26/2016   Procedure: COLONOSCOPY WITH PROPOFOL;  Surgeon: Jerene Bears, MD;  Location: [facility] ENDOSCOPY;  Service: Gastroenterology;  Laterality: N/A;  . CORONARY ANGIOPLASTY WITH STENT PLACEMENT  04/09/2014       ptca/des mid lad   . ESOPHAGOGASTRODUODENOSCOPY N/A 12/25/2015   Procedure: ESOPHAGOGASTRODUODENOSCOPY (EGD);  Surgeon: Mauri Pole, MD;  Location: [facility] ENDOSCOPY;  Service: Endoscopy;  Laterality: N/A;  bedside  . ESOPHAGOGASTRODUODENOSCOPY (EGD) WITH PROPOFOL N/A 04/26/2016   Procedure: ESOPHAGOGASTRODUODENOSCOPY (EGD) WITH PROPOFOL;  Surgeon: Jerene Bears, MD;  Location: [facility]OS[facility];  Service: Gastroenterology;  Laterality: N/A;  . EYE SURGERY Bilateral    cataract  . FEMORAL ARTERY REPAIR  04/09/2014   ANGIOSEAL  . INSERTION OF DIALYSIS CATHETER  08/01/2011   Procedure: INSERTION OF DIALYSIS CATHETER;   Surgeon: Rosetta Posner, MD;  Location: [facility];  Service: Vascular;  Laterality: Right;  insertion of dialysis catheter on right internal jugular vein  . INSERTION OF DIALYSIS CATHETER Right 01/20/2015   Procedure: INSERTION OF DIALYSIS CATHETER - RIGHT INTERNAL JUGULAR ;  Surgeon: Angelia Mould, MD;  Location: [facility];  Service: Vascular;  Laterality: Right;  . INSERTION OF DIALYSIS CATHETER Right 08/02/2018   Procedure: Insertion Of Dialysis Catheter;  Surgeon: Angelia Mould, MD;  Location: [facility] OR;  Service: Vascular;  Laterality: Right;  . KIDNEY TRANSPLANT  04/25/1993  . LEFT HEART CATHETERIZATION WITH CORONARY ANGIOGRAM N/A 03/19/2014   Procedure: LEFT HEART CATHETERIZATION WITH CORONARY ANGIOGRAM;  Surgeon: Burnell Blanks, MD;  Location: [facility] CATH LAB;  Service: Cardiovascular;  Laterality: N/A;  . LIGATION OF ARTERIOVENOUS  FISTULA Left 11/01/2018   Procedure: Ligation Of  Brachio Cephalic Arteriovenous  Fistula;  Surgeon: Serafina Mitchell, MD;  Location: [facility] OR;  Service: Vascular;  Laterality: Left;  . LIGATION OF ARTERIOVENOUS  FISTULA Left 02/19/2019   Procedure: LIGATION OF ARTERIOVENOUS  FISTULA LEFT ARM;  Surgeon: Angelia Mould, MD;  Location: [place];  Service: Vascular;  Laterality: Left;  . LYMPH NODE BIOPSY  08/10/2011   Procedure: LYMPH NODE BIOPSY;  Surgeon: Merrie Roof, MD;  Location: [facility];  Service: General;  Laterality: Left;  left groin lymph biopsy  . NEUROPLASTY / TRANSPOSITION ULNAR NERVE AT ELBOW Left after 2005  . PATCH ANGIOPLASTY Left 08/09/2016   Procedure: LEFT  ARTERIOVENOUS FISTULA PATCH ANGIOPLASTY;  Surgeon: Waynetta Sandy, MD;  Location: [place];  Service: Vascular;  Laterality: Left;  . PERCUTANEOUS CORONARY ROTOBLATOR INTERVENTION (PCI-R) N/A 04/09/2014   Procedure: PERCUTANEOUS CORONARY ROTOBLATOR INTERVENTION (PCI-R);  Surgeon: Burnell Blanks, MD;  Location: [facility] CATH LAB;  Service: Cardiovascular;  Laterality: N/A;  .  PERIPHERAL VASCULAR BALLOON ANGIOPLASTY  07/11/2017   Procedure: PERIPHERAL VASCULAR BALLOON ANGIOPLASTY;  Surgeon: Waynetta Sandy, MD;  Location: [place] [facility] LAB;  Service: Cardiovascular;;  left arm AV fistula  . PERIPHERAL VASCULAR CATHETERIZATION N/A 12/25/2014   Procedure: Upper Extremity Venography;  Surgeon: Conrad Lancaster, MD;  Location: [place] [facility];  Service: Cardiovascular;  Laterality: N/A;  . PERIPHERAL VASCULAR CATHETERIZATION Left 02/24/2016   Procedure: Fistulagram;  Surgeon: Waynetta Sandy, MD;  Location: [place] [facility] LAB;  Service: Cardiovascular;  Laterality: Left;  .  PERIPHERAL VASCULAR CATHETERIZATION Left 02/24/2016   Procedure: Peripheral Vascular Intervention;  Surgeon: Waynetta Sandy, MD;  Location: [place] [facility] LAB;  Service: Cardiovascular;  Laterality: Left;  AV FISTULA  . PERITONEAL CATHETER INSERTION    . PERITONEAL CATHETER REMOVAL    . REVISON OF ARTERIOVENOUS FISTULA Right 10/15/6331   Procedure: PLICATION / REVISION OF ARTERIOVENOUS FISTULA;  Surgeon: Angelia Mould, MD;  Location: [place];  Service: Vascular;  Laterality: Right;  . REVISON OF ARTERIOVENOUS FISTULA Left 09/01/2015   Procedure: SUPERFICIALIZATION OF LEFT ARM BRACHIOCEPHALIC ARTERIOVENOUS FISTULA;  Surgeon: Angelia Mould, MD;  Location: [facility];  Service: Vascular;  Laterality: Left;  . REVISON OF ARTERIOVENOUS FISTULA Left 08/09/2016   Procedure: REVISION OF ARTERIAL ANASTOMOSIS OF ARTERIOVENOUS FISTULA;  Surgeon: Waynetta Sandy, MD;  Location: [place];  Service: Vascular;  Laterality: Left;  . SHOULDER ARTHROSCOPY Left ~ 2006   frozen  . THROMBECTOMY W/ EMBOLECTOMY Left 08/02/2018   Procedure: THROMBECTOMY ARTERIOVENOUS FISTULA;  Surgeon: Angelia Mould, MD;  Location: [place];  Service: Vascular;  Laterality: Left;  . TOE AMPUTATION Bilateral after 2005  . UMBILICAL HERNIA REPAIR  1990's  . UPPER EXTREMITY VENOGRAPHY Bilateral 10/16/2018    Procedure: UPPER EXTREMITY VENOGRAPHY;  Surgeon: Serafina Mitchell, MD;  Location: [place] [facility] LAB;  Service: Cardiovascular;  Laterality: Bilateral;  . VENOGRAM Right 07/28/2011   Procedure: VENOGRAM;  Surgeon: Conrad Lemoyne, MD;  Location: [facility] CATH LAB;  Service: Cardiovascular;  Laterality: Right;  . VITRECTOMY Bilateral        Family History  Problem Relation Age of Onset  . Colon polyps Father   . Diabetes Father   . Hypertension Father   . Hypertension Mother   . Diabetes Mother   . Hyperlipidemia Mother   . Breast cancer Maternal Aunt   . Bone cancer Maternal Aunt   . Liver cancer Maternal Aunt   . Malignant hyperthermia Neg Hx   . Heart attack Neg Hx   . Stroke Neg Hx   . Colon cancer Neg Hx   . Stomach cancer Neg Hx   . Pancreatic cancer Neg Hx   . Esophageal cancer Neg Hx     Social History   Tobacco Use  . Smoking status: Never Smoker  . Smokeless tobacco: Never Used  Vaping Use  . Vaping Use: Never used  Substance Use Topics  . Alcohol use: Yes    Comment: rare  . Drug use: No    Home Medications Prior to Admission medications   Medication Sig Start Date End Date Taking? Authorizing Provider  acetaminophen (TYLENOL) 500 MG tablet Take 1,000 mg by mouth every 6 (six) hours as needed (pain/headaches.).     [provider]  allopurinol (ZYLOPRIM) 100 MG tablet TAKE 2 TABLETS BY MOUTH DAILY 05/23/19   Bonnita Hollow, MD  aspirin EC 81 MG tablet Take 81 mg by mouth at bedtime.    [provider]  atorvastatin (LIPITOR) 20 MG tablet Take 20 mg by mouth daily. 02/12/19   [provider]  B Complex-C-Folic Acid (DIALYVITE 545) 0.8 MG TABS Take 1 tablet by mouth daily.    [provider]  calcium acetate (PHOSLO) 667 MG capsule Take by mouth See admin instructions. Take 3 Capsules by mouth with each meal daily 09/19/12   [provider]  cetirizine (ZYRTEC) 10 MG tablet Take 10 mg by mouth daily.    [provider]  diphenhydrAMINE (BENADRYL) 25 MG  tablet Take 50 mg by mouth at bedtime.    [provider]  docusate sodium (COLACE) 100 MG capsule Take 100 mg by mouth 2 (two) times daily.     [provider]  famotidine (PEPCID) 20 MG tablet Take 20 mg by mouth 2 (two) times daily.    [provider]  fluticasone (FLONASE) 50 MCG/ACT nasal spray Place 2 sprays into both nostrils daily as needed for allergies.     [provider]  gabapentin (NEURONTIN) 300 MG capsule Take 300 mg by mouth 2 (two) times daily.    [provider]  GLUCAGON EMERGENCY 1 MG injection Inject 1 mg into the muscle daily as needed (low blood sugar).  01/19/12   [provider]  Glucose-Cholecalciferol (RELION GLUCOSE SHOT) LIQD Take 1 Dose by mouth daily as needed (low blood sugar).    [provider]  hydrocortisone 2.5 % cream Apply 1 application topically 2 (two) times daily as needed (rash / itching).    [provider]  HYDROmorphone (DILAUDID) 2 MG tablet Take 2 mg by mouth 2 (two) times daily as needed for moderate pain or severe pain.     [provider]  insulin lispro (HUMALOG) 100 UNIT/ML injection Inject 75-120 Units into the skin continuous. Per insulin pump    [provider]  levothyroxine (SYNTHROID, LEVOTHROID) 112 MCG tablet Take 112 mcg by mouth at bedtime.     [provider]  loperamide (IMODIUM A-D) 2 MG tablet Take 2-4 mg by mouth 4 (four) times daily as needed for diarrhea or loose stools.    [provider]  Melatonin 5 MG TABS Take 5 mg by mouth at bedtime.    [provider]  midodrine (PROAMATINE) 10 MG tablet Take 1 tablet (10 mg total) by mouth 3 (three) times daily. Takes 1 tab in mornings of dialysis and takes 2 tabs 30 mins before starting dialysis (m,w,f) 04/14/16   Elgergawy, Silver Huguenin, MD  nitroGLYCERIN (NITROSTAT) 0.4 MG SL tablet Place 0.4 mg under the tongue every 5 (five)  minutes as needed for chest pain.    [provider]  Polyethyl Glycol-Propyl Glycol (SYSTANE ULTRA PF OP) Place 1 drop into both eyes every 2 (two) hours as needed (dry eyes).    [provider]  Polyethyl Glycol-Propyl Glycol 0.4-0.3 % SOLN Place 1 drop into both eyes 4 (four) times daily.    [provider]  polyethylene glycol (MIRALAX / GLYCOLAX) packet Take 17 g by mouth daily. 02/06/16   Regalado, Belkys A, MD  prasugrel (EFFIENT) 10 MG TABS tablet Take 1 tablet (10 mg total) by mouth daily. Please keep upcoming appt in January 2022 before anymore refills. Thank you 03/02/20   Burnell Blanks, MD  promethazine (PHENERGAN) 25 MG tablet Take 25 mg by mouth every 6 (six) hours as needed for nausea or vomiting.    [provider]  rOPINIRole (REQUIP) 2 MG tablet Take 2 mg by mouth at bedtime.    [provider]  sevelamer carbonate (RENVELA) 800 MG tablet Take 800 mg by mouth 3 (three) times daily with meals.    [provider]  testosterone cypionate (DEPOTESTOSTERONE CYPIONATE) 200 MG/ML injection Inject 200 mg into the muscle every 14 (fourteen) days.  09/10/16   [provider]    Allergies    Iodinated diagnostic agents, Prednisone, Adhesive [tape], Amoxicillin, Clindamycin/lincomycin, Enalapril, Mobic [meloxicam], Morphine, Brilinta [ticagrelor], Ciprofloxacin, Clopidogrel, Codeine, Doxycycline, Hydrocodone, Levofloxacin, Oxycodone, and Rocephin [ceftriaxone]  Review of Systems   Review of Systems  Physical Exam Updated Vital Signs BP (!) 169/84 (BP Location: Left Arm)   Pulse 88   Temp 98.1 F (36.7 C) (Oral)   Resp (!) 21   SpO2 100%   Physical Exam  ED Results / Procedures / Treatments   Labs (all labs ordered are listed, but only abnormal results are displayed) Labs Reviewed  CBC WITH DIFFERENTIAL/PLATELET - Abnormal; Notable for the following components:      Result Value   RBC 2.82 (*)    Hemoglobin  8.8 (*)    HCT 29.9 (*)    MCV 106.0 (*)    MCHC 29.4 (*)    Eosinophils Absolute 0.7 (*)    All other components within normal limits  COMPREHENSIVE METABOLIC PANEL - Abnormal; Notable for the following components:   Sodium 133 (*)    Chloride 92 (*)    Glucose, Bld 212 (*)    BUN 40 (*)    Creatinine, Ser 9.56 (*)    Calcium 8.1 (*)    Albumin 3.0 (*)    AST 12 (*)    GFR, Estimated 6 (*)    All other components within normal limits  D-DIMER, QUANTITATIVE (NOT AT [facility]) - Abnormal; Notable for the following components:   D-Dimer, Quant 0.87 (*)    All other components within normal limits  FERRITIN - Abnormal; Notable for the following components:   Ferritin 1,030 (*)    All other components within normal limits  FIBRINOGEN - Abnormal; Notable for the following components:   Fibrinogen 670 (*)    All other components within normal limits  C-REACTIVE PROTEIN - Abnormal; Notable for the following components:   CRP 12.9 (*)    All other components within normal limits  CULTURE, BLOOD (ROUTINE X 2)  CULTURE, BLOOD (ROUTINE X 2)  LACTIC ACID, PLASMA  PROCALCITONIN  LACTATE DEHYDROGENASE  TRIGLYCERIDES  LACTIC ACID, PLASMA    EKG None  Radiology DG Chest 2 View  Result Date: 05/20/2020 CLINICAL DATA:  Shortness of breath EXAM: CHEST - 2 VIEW COMPARISON:  05/08/2020 FINDINGS: Right IJ approach dialysis catheter tip is at the right atrium. Low lung volumes. Probable mild interstitial edema similar to prior study. No significant pleural effusion. No pneumothorax. Stable cardiomediastinal contours. No acute osseous abnormality. IMPRESSION: Probable mild interstitial edema. Electronically Signed   By: Macy Mis M.D.   On: 05/20/2020 14:55    Procedures Procedures {Remember to document critical care time when appropriate:1}  Medications Ordered in ED Medications - No data to display  ED Course  I have reviewed the triage vital signs and the nursing notes.  Pertinent  labs & imaging results that were available during my care of the patient were reviewed by me and considered in my medical decision making (see chart for details).    MDM Rules/Calculators/A&P                          55 year old male with numerous comorbidities, vaccinated and boosted, but still contracted COVID-19 is on day 12 of symptoms, presents to the ED today due to worsening shortness of breath.  Patient received a monoclonal antibody infusion and course of oral antivirals for Covid shortly after he was diagnosed and initially felt like he was improving, but over the past 2 to 3 days he has had worsening shortness of breath.  He has oxygen at home which she is only supposed to  wear at night, but he has been wearing this oxygen at all times due to worsening shortness of breath and over the past few days has felt the need to turn the oxygen up intermittently as high as 6 L.  On arrival on 3 L patient satting in the 90s, he has some rales in the bases and some decreased breath sounds, is not in any acute respiratory distress.  Patient states that he had bacterial pneumonia in December prior to contracting Covid and reports that he feels like his respiratory status has never fully improved.  Will get Covid labs and inflammatory markers, but feel patient would also need CTA to rule out PE and better assess for potential pneumonia as chest x-ray here shows some signs of mild interstitial edema only.  Patient is also on dialysis, missed his dialysis appointment today due to these worsening symptoms.    Case discussed with family medicine teaching service who will see and admit patient.  Dale [name] was evaluated in Emergency Department on 05/20/2020 for the symptoms described in the history of present illness. He was evaluated in the context of the global COVID-19 pandemic, which necessitated consideration that the patient might be at risk for infection with the SARS-CoV-2 virus that causes COVID-19.  Institutional protocols and algorithms that pertain to the evaluation of patients at risk for COVID-19 are in a state of rapid change based on information released by regulatory bodies including the CDC and federal and state organizations. These policies and algorithms were followed during the patient's care in the ED.  Final Clinical Impression(s) / ED Diagnoses Final diagnoses:  None    Rx / DC Orders ED Discharge Orders    None

## 2020-05-21 DIAGNOSIS — U071 COVID-19: Secondary | ICD-10-CM | POA: Diagnosis not present

## 2020-05-21 DIAGNOSIS — N186 End stage renal disease: Secondary | ICD-10-CM | POA: Diagnosis not present

## 2020-05-21 DIAGNOSIS — Z992 Dependence on renal dialysis: Secondary | ICD-10-CM | POA: Diagnosis not present

## 2020-05-21 DIAGNOSIS — J9601 Acute respiratory failure with hypoxia: Secondary | ICD-10-CM | POA: Diagnosis not present

## 2020-05-21 LAB — HEMOGLOBIN A1C
Hgb A1c MFr Bld: 7.4 % — ABNORMAL HIGH (ref 4.8–5.6)
Mean Plasma Glucose: 165.68 mg/dL

## 2020-05-21 LAB — CBC WITH DIFFERENTIAL/PLATELET
Abs Immature Granulocytes: 0.04 10*3/uL (ref 0.00–0.07)
Basophils Absolute: 0 10*3/uL (ref 0.0–0.1)
Basophils Relative: 0 %
Eosinophils Absolute: 0 10*3/uL (ref 0.0–0.5)
Eosinophils Relative: 0 %
HCT: 33.6 % — ABNORMAL LOW (ref 39.0–52.0)
Hemoglobin: 10.4 g/dL — ABNORMAL LOW (ref 13.0–17.0)
Immature Granulocytes: 1 %
Lymphocytes Relative: 8 %
Lymphs Abs: 0.5 10*3/uL — ABNORMAL LOW (ref 0.7–4.0)
MCH: 32.8 pg (ref 26.0–34.0)
MCHC: 31 g/dL (ref 30.0–36.0)
MCV: 106 fL — ABNORMAL HIGH (ref 80.0–100.0)
Monocytes Absolute: 0.3 10*3/uL (ref 0.1–1.0)
Monocytes Relative: 4 %
Neutro Abs: 5.6 10*3/uL (ref 1.7–7.7)
Neutrophils Relative %: 87 %
Platelets: 336 10*3/uL (ref 150–400)
RBC: 3.17 MIL/uL — ABNORMAL LOW (ref 4.22–5.81)
RDW: 13.6 % (ref 11.5–15.5)
WBC: 6.4 10*3/uL (ref 4.0–10.5)
nRBC: 0 % (ref 0.0–0.2)

## 2020-05-21 LAB — TSH: TSH: 3.016 u[IU]/mL (ref 0.350–4.500)

## 2020-05-21 LAB — COMPREHENSIVE METABOLIC PANEL
ALT: 14 U/L (ref 0–44)
AST: 13 U/L — ABNORMAL LOW (ref 15–41)
Albumin: 3 g/dL — ABNORMAL LOW (ref 3.5–5.0)
Alkaline Phosphatase: 91 U/L (ref 38–126)
Anion gap: 16 — ABNORMAL HIGH (ref 5–15)
BUN: 47 mg/dL — ABNORMAL HIGH (ref 6–20)
CO2: 26 mmol/L (ref 22–32)
Calcium: 8.6 mg/dL — ABNORMAL LOW (ref 8.9–10.3)
Chloride: 92 mmol/L — ABNORMAL LOW (ref 98–111)
Creatinine, Ser: 10.61 mg/dL — ABNORMAL HIGH (ref 0.61–1.24)
GFR, Estimated: 5 mL/min — ABNORMAL LOW (ref >60)
Glucose, Bld: 167 mg/dL — ABNORMAL HIGH (ref 70–99)
Potassium: 5.2 mmol/L — ABNORMAL HIGH (ref 3.5–5.1)
Sodium: 134 mmol/L — ABNORMAL LOW (ref 135–145)
Total Bilirubin: 0.5 mg/dL (ref 0.3–1.2)
Total Protein: 8 g/dL (ref 6.5–8.1)

## 2020-05-21 LAB — LIPID PANEL
Cholesterol: 116 mg/dL (ref 0–200)
HDL: 32 mg/dL — ABNORMAL LOW (ref >40)
LDL Cholesterol: 60 mg/dL (ref 0–99)
Total CHOL/HDL Ratio: 3.6 RATIO
Triglycerides: 118 mg/dL (ref <150)
VLDL: 24 mg/dL (ref 0–40)

## 2020-05-21 LAB — CBG MONITORING, ED
Glucose-Capillary: 127 mg/dL — ABNORMAL HIGH (ref 70–99)
Glucose-Capillary: 152 mg/dL — ABNORMAL HIGH (ref 70–99)
Glucose-Capillary: 190 mg/dL — ABNORMAL HIGH (ref 70–99)

## 2020-05-21 LAB — HIV ANTIBODY (ROUTINE TESTING W REFLEX): HIV Screen 4th Generation wRfx: NONREACTIVE

## 2020-05-21 LAB — C-REACTIVE PROTEIN: CRP: 11.3 mg/dL — ABNORMAL HIGH (ref <1.0)

## 2020-05-21 LAB — D-DIMER, QUANTITATIVE: D-Dimer, Quant: 1.13 ug/mL-FEU — ABNORMAL HIGH (ref 0.00–0.50)

## 2020-05-21 MED ORDER — POLYETHYLENE GLYCOL 3350 17 G PO PACK: 17.0000 g | PACK | Freq: Every day | ORAL | 0 refills | Status: AC | PRN

## 2020-05-21 MED ORDER — ALLOPURINOL 100 MG PO TABS
200.0000 mg | ORAL_TABLET | Freq: Every day | ORAL | Status: DC
  Administered 2020-05-21: 200 mg via ORAL
  Filled 2020-05-21: qty 2

## 2020-05-21 MED ORDER — ROPINIROLE HCL 1 MG PO TABS
2.0000 mg | ORAL_TABLET | Freq: Every evening | ORAL | Status: DC
  Filled 2020-05-21: qty 2

## 2020-05-21 MED ORDER — ASPIRIN EC 81 MG PO TBEC: 81.0000 mg | DELAYED_RELEASE_TABLET | Freq: Every day | ORAL | Status: DC

## 2020-05-21 MED ORDER — ATORVASTATIN CALCIUM 10 MG PO TABS
20.0000 mg | ORAL_TABLET | Freq: Every day | ORAL | Status: DC
  Administered 2020-05-21: 20 mg via ORAL

## 2020-05-21 MED ORDER — GABAPENTIN 300 MG PO CAPS: 300.0000 mg | ORAL_CAPSULE | Freq: Two times a day (BID) | ORAL | 0 refills | Status: AC | PRN

## 2020-05-21 MED ORDER — INSULIN ASPART 100 UNIT/ML ~~LOC~~ SOLN: 0.0000 [IU] | Freq: Three times a day (TID) | SUBCUTANEOUS | Status: DC

## 2020-05-21 MED ORDER — CHLORHEXIDINE GLUCONATE CLOTH 2 % EX PADS: 6.0000 | MEDICATED_PAD | Freq: Every day | CUTANEOUS | Status: DC

## 2020-05-21 MED ORDER — PRASUGREL HCL 10 MG PO TABS
10.0000 mg | ORAL_TABLET | Freq: Every day | ORAL | Status: DC
  Administered 2020-05-21: 10 mg via ORAL
  Filled 2020-05-21: qty 1

## 2020-05-21 MED ORDER — ALLOPURINOL 100 MG PO TABS: 100.0000 mg | ORAL_TABLET | ORAL | 0 refills | Status: DC

## 2020-05-21 MED ORDER — INSULIN ASPART 100 UNIT/ML ~~LOC~~ SOLN
0.0000 [IU] | Freq: Three times a day (TID) | SUBCUTANEOUS | Status: DC
  Administered 2020-05-21 (×2): 2 [IU] via SUBCUTANEOUS

## 2020-05-21 MED ORDER — GABAPENTIN 300 MG PO CAPS
300.0000 mg | ORAL_CAPSULE | Freq: Every day | ORAL | Status: DC
  Administered 2020-05-21: 300 mg via ORAL
  Filled 2020-05-21: qty 1

## 2020-05-21 MED ORDER — HYDROMORPHONE HCL 2 MG PO TABS: 2.0000 mg | ORAL_TABLET | Freq: Every day | ORAL | Status: AC | PRN

## 2020-05-21 MED ORDER — HYDROMORPHONE HCL 2 MG PO TABS
1.0000 mg | ORAL_TABLET | Freq: Four times a day (QID) | ORAL | Status: DC | PRN
  Administered 2020-05-21: 1 mg via ORAL
  Filled 2020-05-21: qty 1

## 2020-05-21 MED ORDER — RENA-VITE PO TABS
1.0000 | ORAL_TABLET | Freq: Every day | ORAL | Status: DC
  Administered 2020-05-21: 1 via ORAL
  Filled 2020-05-21: qty 1

## 2020-05-21 MED ORDER — LEVOTHYROXINE SODIUM 112 MCG PO TABS: 112.0000 ug | ORAL_TABLET | Freq: Every day | ORAL | Status: DC

## 2020-05-21 MED ORDER — SEVELAMER CARBONATE 800 MG PO TABS
1600.0000 mg | ORAL_TABLET | Freq: Three times a day (TID) | ORAL | Status: DC
  Filled 2020-05-21 (×2): qty 2

## 2020-05-21 MED ORDER — CALCIUM ACETATE (PHOS BINDER) 667 MG PO CAPS
2001.0000 mg | ORAL_CAPSULE | Freq: Three times a day (TID) | ORAL | Status: DC
  Filled 2020-05-21 (×2): qty 3

## 2020-05-21 MED ORDER — FAMOTIDINE 20 MG PO TABS
20.0000 mg | ORAL_TABLET | Freq: Every day | ORAL | Status: DC
  Administered 2020-05-21: 20 mg via ORAL

## 2020-05-21 MED ORDER — INSULIN GLARGINE 100 UNIT/ML ~~LOC~~ SOLN
30.0000 [IU] | Freq: Every day | SUBCUTANEOUS | Status: DC
  Filled 2020-05-21: qty 0.3

## 2020-05-21 NOTE — Progress Notes (Addendum)
Family Medicine Teaching Service Daily Progress Note Intern Pager: [phone]  Patient name: Dale [name] Medical record number: [id number] Date of birth: 05-01-1965 Age: 55 y.o. Gender: male  Primary Care Provider: Donney Dice, DO Consultants: Nephrology Code Status: Full  Pt Overview and Major Events to Date:  1/26: admitted  Assessment and Plan:  Arben Karagiannis is a 55 y.o. male presenting with acute on chronic hypoxemic respiratory failure in the setting of COVID-19 infection. PMH is significant for ESRD on HD s/p failed renal transplant, PAD s/p left BKA, CAD, T1DM, HLD, anemia, hypothyroidism, OSA, obesity.  Acute on Chronic Hypoxemic Respiratory Failure  COVID-19 Patient presenting with increased dyspnea on exertion. Likely secondary to COVID-19 and volume overload due to missed HD (missed yesterday, shortened treatment on 1/24). Also large component of deconditioning. Stable on his home 2L [place] this morning with SpO2 100% and RR 18-24.  -Airborne/contact precautions -Supplemental O2 as needed -Plan for HD today per nephro -Monitor respiratory status -Incentive spirometry -PT/OT -d/c Remdesevir and Decadron as patient is >10 days out from diagnosis/symptom onset -Trend inflammatory markers (CRP, D-Dimer)  ESRD on HD MWF Creatinine 10.61 this morning with K 5.2. Missed dialysis yesterday because he was in the ED. Had shortened session on 1/24. Suspect some of his dyspnea is secondary to volume overload (crackles appreciated at bilateral lung bases).  -Nephrology following, appreciate recommendations -Plan for HD today -Daily renal function panel  T1DM Patient with hx of T1DM-- uses omnipod at home. His omnipod was removed prior to CT imaging in the ED. Glucose 167 this morning. -SSI -CBG monitoring -f/u A1c results  CAD  PAD s/p L BKA Chronic, stable. Home meds: ASA '81mg'$ , prasugrel, NTG prn, and atorvastatin '20mg'$  -Continue ASA '81mg'$  -Restart remainder of home meds  pending formal med rec  HLD Chronic, stable. Lipid panel pending. -Continue home atorvastatin '20mg'$  daily pending formal med rec  Hypothyroidism Chronic, stable. TSH pending.  -Continue home levothyroxine 145mg daily  GERD Chronic, stable. On Famotidine '20mg'$  BID at home. -Formal med rec pending  Gout Chronic, stable. Home meds: allopurinol '200mg'$  daily -Restart home meds pending formal med rec  FEN/GI: carb modified diet PPx: Heparin   Status is: Observation The patient remains OBS appropriate and will d/c before 2 midnights.  Dispo: The patient is from: Home              Anticipated d/c is to: Home              Anticipated d/c date is: 1 day              Patient currently is not medically stable to d/c.   Difficult to place patient No    Subjective:  Patient endorses dyspnea on exertion this morning, but states he feels fine at rest. Some cough, especially with deep breathing, but otherwise no complaints.  Objective: Temp:  [98.1 F (36.7 C)] 98.1 F (36.7 C) (01/26 1415) Pulse Rate:  [81-99] 97 (01/27 0545) Resp:  [14-29] 22 (01/27 0545) BP: (118-183)/(55-145) 124/58 (01/27 0545) SpO2:  [91 %-100 %] 100 % (01/27 0545) Physical Exam: General: alert, NAD Cardiovascular: RRR, normal S1/S2 Respiratory: speaking in full sentences, subcostal retractions noted, mildly tachypneic to low 20s on 2L [place], bibasilar crackles Abdomen: obese, soft, nontender Extremities: no edema noted on R lower extremity (compression stocking in place), s/p L BKA (no edema noted in thigh or sacral region)  Laboratory: Recent Labs  Lab 05/20/20 1527 05/21/20 0423  WBC  7.4 6.4  HGB 8.8* 10.4*  HCT 29.9* 33.6*  PLT 293 336   Recent Labs  Lab 05/20/20 1527 05/21/20 0423  NA 133* 134*  K 4.5 5.2*  CL 92* 92*  CO2 29 26  BUN 40* 47*  CREATININE 9.56* 10.61*  CALCIUM 8.1* 8.6*  PROT 7.5 8.0  BILITOT 0.3 0.5  ALKPHOS 77 91  ALT 14 14  AST 12* 13*  GLUCOSE 212* 167*     Imaging/Diagnostic Tests: DG Chest 2 View Result Date: 05/20/2020 IMPRESSION: Probable mild interstitial edema.   CT Angio Chest PE W and/or Wo Contrast Result Date: 05/20/2020 IMPRESSION: 1. No evidence of significant pulmonary embolus. 2. Small bilateral pleural effusions. 3. Patchy airspace disease in the lungs could represent multifocal pneumonia or edema. 4. Mediastinal lymphadenopathy, unchanged since prior study from 2017, likely reactive. 5. Focal narrowing of the SVC above the level of the left brachiocephalic. Multiple venous collaterals in the upper chest. 6. Severe atrophy of the right kidney. 7. Diffuse skeletal sclerosis likely representing renal osteodystrophy. 8. Aortic atherosclerosis.     Alcus Dad, MD 05/21/2020, 6:41 AM PGY-1, [place] Intern pager: [phone], text pages welcome

## 2020-05-21 NOTE — ED Notes (Signed)
Pt had an extremely large BM, initially formed, then liquid.

## 2020-05-21 NOTE — ED Notes (Signed)
Pt sleeping. Chest with equal rise and fall. No distress noted.

## 2020-05-21 NOTE — Discharge Instructions (Signed)
Dear [name],   Thank you for letting us participate in your care! In this section, you will find a brief hospital admission summary of why you were admitted to the hospital, what happened during your admission, your diagnosis/diagnoses, and recommended follow up.   You were admitted because you were experiencing shortness of breath.    This is most likely related to a combination of factors: COVID, fluid overload from shortened/missed dialysis, and deconditioning.  Your oxygen levels remained stable on your home oxygen level (2 liters) and you were discharged from the hospital for meeting this goal.    POST-HOSPITAL & CARE INSTRUCTIONS 1. Please attend your regular dialysis clinic tomorrow (Fri 1/28) at 4pm 2. Someone should be contacting you regarding home health physical therapy and occupational therapy 3. Please see medications section of this packet for any medication changes.   DOCTOR'S APPOINTMENTS & FOLLOW UP Future Appointments  Date Time Provider [place]  06/11/2020  4:25 PM Donney Dice, DO [facility] [place]    Thank you for choosing [facility]! Take care and be well!  [name]  [place], [place] [address] [phone]

## 2020-05-21 NOTE — Discharge Summary (Signed)
[facility] Discharge Summary  Patient name: Dale [name] Medical record number: [id number] Date of birth: 11/02/1965 Age: 55 y.o. Gender: male Date of Admission: 05/20/2020  Date of Discharge: 05/21/2020 Admitting Physician: Zola Button, MD  Primary Care Provider: Donney Dice, DO Consultants: Nephrology, PT/OT  Indication for Hospitalization: Acute on chronic hypoxemic respiratory failure  Discharge Diagnoses/Problem List:  Acute on chronic hypoxemic respiratory failure COVID-19 ESRD on HD Deconditioning Obesity OSA Type 1 diabetes PAD s/p left BKA Hyperlipidemia Hypothyroidism GERD Gout  Disposition: Home with home health PT/OT  Discharge Condition: Stable  Discharge Exam:  General: alert, NAD Cardiovascular: RRR, normal S1/S2 Respiratory: speaking in full sentences, mild subcostal retractions noted, mildly tachypneic to low 20s on 2L [place] with minimal exertion, bibasilar crackles Abdomen: obese, soft, nontender Extremities: no edema noted on R lower extremity (compression stocking in place), s/p L BKA (no edema noted in thigh or sacral region)  Brief Hospital Course:  Dale [name] Jris a 55 y.o.malewho presented with acuteon chronichypoxemic respiratory failure in the setting of COVID-19 infection.  Acute on Chronic Hypoxemic Respiratory Failure Patient presented with worsening dyspnea on exertion. He tested positive for COVID-19 on 1/14 and was previously treated with sotrovimab and molnupiravir analog as an outpatient. He required up to 6L supplementation oxygen on admission, but was quickly able to wean to his home 2L. CXR with evidence of mild interstitial edema and CTA showed multifocal pneumonia vs. edema. Etiology ultimately thought to be multifactorial related to COVID-19, overall severe deconditioning, and volume overload from shortened/missed HD. The following day, patient was discharged home as he remained stable on his baseline  2L and was scheduled for outpatient HD.  The remainder of patient's chronic medical problems remained stable throughout his brief admission and he was maintained on his home medications.  Issues for Follow Up:  1. Would highly recommend tapering off chronic opioids 2. Encourage regular physical activity (and weight loss) to improve cardiovascular conditioning and overall health  Significant Procedures: None  Significant Labs and Imaging:  Recent Labs  Lab 05/20/20 1527 05/21/20 0423  WBC 7.4 6.4  HGB 8.8* 10.4*  HCT 29.9* 33.6*  PLT 293 336   Recent Labs  Lab 05/20/20 1527 05/21/20 0423  NA 133* 134*  K 4.5 5.2*  CL 92* 92*  CO2 29 26  GLUCOSE 212* 167*  BUN 40* 47*  CREATININE 9.56* 10.61*  CALCIUM 8.1* 8.6*  ALKPHOS 77 91  AST 12* 13*  ALT 14 14  ALBUMIN 3.0* 3.0*   DG Chest 2 View Result Date: 05/20/2020 FINDINGS: Right IJ approach dialysis catheter tip is at the right atrium. Low lung volumes. Probable mild interstitial edema similar to prior study. No significant pleural effusion. No pneumothorax. Stable cardiomediastinal contours. No acute osseous abnormality. IMPRESSION: Probable mild interstitial edema.   CT Angio Chest PE W and/or Wo Contrast Result Date: 05/20/2020 IMPRESSION: 1. No evidence of significant pulmonary embolus. 2. Small bilateral pleural effusions. 3. Patchy airspace disease in the lungs could represent multifocal pneumonia or edema. 4. Mediastinal lymphadenopathy, unchanged since prior study from 2017, likely reactive. 5. Focal narrowing of the SVC above the level of the left brachiocephalic. Multiple venous collaterals in the upper chest. 6. Severe atrophy of the right kidney. 7. Diffuse skeletal sclerosis likely representing renal osteodystrophy. 8. Aortic atherosclerosis.    Results/Tests Pending at Time of Discharge: None  Discharge Medications:  Allergies as of 05/21/2020      Reactions   Iodinated Diagnostic  Agents Itching, Rash, Other  (See Comments)   Systemic itching and transient rash appearance within hours of receiving contrast    Prednisone Other (See Comments)   Increases sugar levels   Adhesive [tape] Itching, Other (See Comments)   Blisters on arm from adhesive tape at dialysis   Amoxicillin Itching, Rash, Other (See Comments)   Did it involve swelling of the face/tongue/throat, SOB, or low BP? No Did it involve sudden or severe rash/hives, skin peeling, or any reaction on the inside of your mouth or nose? No Did you need to seek medical attention at a hospital or doctor's office? No When did it last happen?10+ years If all above answers are "NO", may proceed with cephalosporin use.   Clindamycin/lincomycin Hives, Itching   Enalapril Rash, Cough   Cough   Mobic [meloxicam] Hives, Rash   Morphine Hives, Nausea Only, Rash   Brilinta [ticagrelor] Nausea And Vomiting   Ciprofloxacin Itching, Rash   Clopidogrel Rash   Codeine Hives, Itching, Rash   Doxycycline Rash   Hydrocodone Rash   Levofloxacin Hives, Itching   Oxycodone Rash   Rocephin [ceftriaxone] Itching      Medication List    STOP taking these medications   diphenhydrAMINE 25 MG tablet Commonly known as: BENADRYL   docusate sodium 100 MG capsule Commonly known as: COLACE     TAKE these medications   acetaminophen 500 MG tablet Commonly known as: TYLENOL Take 1,000 mg by mouth every 6 (six) hours as needed (pain/headaches.).   allopurinol 100 MG tablet Commonly known as: ZYLOPRIM Take 1 tablet (100 mg total) by mouth every other day. What changed:   how much to take  when to take this   aspirin EC 81 MG tablet Take 81 mg by mouth at bedtime.   atorvastatin 20 MG tablet Commonly known as: LIPITOR Take 20 mg by mouth daily.   calcium acetate 667 MG capsule Commonly known as: PHOSLO Take 2,001 mg by mouth 3 (three) times daily with meals.   cetirizine 10 MG tablet Commonly known as: ZYRTEC Take 10 mg by mouth daily.    Dialyvite 800 0.8 MG Tabs Take 1 tablet by mouth daily.   famotidine 20 MG tablet Commonly known as: PEPCID Take 20 mg by mouth 2 (two) times daily.   fluticasone 50 MCG/ACT nasal spray Commonly known as: FLONASE Place 2 sprays into both nostrils daily as needed for allergies.   gabapentin 300 MG capsule Commonly known as: NEURONTIN Take 1 capsule (300 mg total) by mouth 2 (two) times daily as needed. What changed:   when to take this  reasons to take this   Glucagon Emergency 1 MG Kit Inject 1 mg into the muscle daily as needed (low blood sugar).   HYDROmorphone 2 MG tablet Commonly known as: DILAUDID Take 1 tablet (2 mg total) by mouth daily as needed for severe pain. What changed:   when to take this  reasons to take this   insulin lispro 100 UNIT/ML injection Commonly known as: HUMALOG Inject 75-120 Units into the skin continuous. Per insulin pump   levothyroxine 112 MCG tablet Commonly known as: SYNTHROID Take 112 mcg by mouth at bedtime.   loperamide 2 MG tablet Commonly known as: IMODIUM A-D Take 2-4 mg by mouth 4 (four) times daily as needed for diarrhea or loose stools.   melatonin 5 MG Tabs Take 5 mg by mouth at bedtime.   midodrine 10 MG tablet Commonly known as: PROAMATINE Take 1 tablet (10  mg total) by mouth 3 (three) times daily. Takes 1 tab in mornings of dialysis and takes 2 tabs 30 mins before starting dialysis (m,w,f)   nitroGLYCERIN 0.4 MG SL tablet Commonly known as: NITROSTAT Place 0.4 mg under the tongue every 5 (five) minutes as needed for chest pain.   Polyethyl Glycol-Propyl Glycol 0.4-0.3 % Soln Place 1 drop into both eyes 2 (two) times daily.   SYSTANE ULTRA PF OP Place 1 drop into both eyes 4 (four) times daily as needed (dry eyes).   polyethylene glycol 17 g packet Commonly known as: MIRALAX / GLYCOLAX Take 17 g by mouth daily as needed. What changed:   when to take this  reasons to take this   prasugrel 10 MG Tabs  tablet Commonly known as: EFFIENT Take 1 tablet (10 mg total) by mouth daily. Please keep upcoming appt in January 2022 before anymore refills. Thank you   promethazine 25 MG tablet Commonly known as: PHENERGAN Take 25 mg by mouth every 6 (six) hours as needed for nausea or vomiting.   ReliOn Glucose Shot Liqd Take 1 Dose by mouth daily as needed (low blood sugar).   rOPINIRole 2 MG tablet Commonly known as: REQUIP Take 2 mg by mouth every evening.   sevelamer carbonate 800 MG tablet Commonly known as: RENVELA Take 1,600 mg by mouth 3 (three) times daily with meals.   testosterone cypionate 200 MG/ML injection Commonly known as: DEPOTESTOSTERONE CYPIONATE Inject 200 mg into the muscle every 14 (fourteen) days.       Discharge Instructions: Please refer to Patient Instructions section of EMR for full details.  Patient was counseled important signs and symptoms that should prompt return to medical care, changes in medications, dietary instructions, activity restrictions, and follow up appointments.   Follow-Up Appointments:   Future Appointments  Date Time Provider Department Center  06/11/2020  4:25 PM Donney Dice, DO [facility] [place]     Alcus Dad, MD 05/21/2020, 4:38 PM PGY-1, [place]

## 2020-05-21 NOTE — Evaluation (Signed)
Physical Therapy Evaluation Patient Details Name: Dale [name] MRN: [id number] DOB: [birth year] Today's Date: 05/21/2020   History of Present Illness  55 y.o. male presenting with acute on chronic hypoxemic respiratory failure in the setting of COVID-19 infection. PMH is significant for ESRD on HD s/p failed renal transplant, PAD s/p left BKA, CAD, T1DM, HLD, anemia, hypothyroidism, OSA, obesity  Clinical Impression  Bed level evaluation completed this date due to patient in significant pain and just mobilized to EOB with OT.  Did issue and have pt perform incentive spirometer and educated for each hour.  Patient normally mainly w/c level but walks into bathroom at home with walker.  Feel he will benefit from skilled PT in the acute setting to continue to mobilize as tolerated to allow for d/c home with follow up HHPT.  Wife works so pt will need to be able to mobilize somewhat independently.    Follow Up Recommendations Home health PT    Equipment Recommendations  None recommended by PT    Recommendations for Other Services       Precautions / Restrictions Precautions Precautions: Fall Precaution Comments: L BKA with prosthesis      Mobility  Bed Mobility Overal bed mobility: Needs Assistance Bed Mobility: Supine to Sit;Sit to Supine     Supine to sit: Supervision Sit to supine: Supervision   General bed mobility comments: Just sat EOB with OT and in significant pain from allergy to IV contrast he had yesterday so deferred mobility.    Transfers Overall transfer level: Needs assistance Equipment used: Rolling walker (2 wheeled) Transfers: Sit to/from Stand Sit to Stand: Min guard            Ambulation/Gait                Stairs            Wheelchair Mobility    Modified Rankin (Stroke Patients Only)       Balance Overall balance assessment: Needs assistance   Sitting balance-Leahy Scale: Good       Standing balance-Leahy Scale:  Poor Standing balance comment: reliant on external support                             Pertinent Vitals/Pain Pain Assessment: Faces Faces Pain Scale: Hurts whole lot Pain Location: burning in back, eyes, arms from contrast Pain Descriptors / Indicators: Burning;Constant;Grimacing Pain Intervention(s): Limited activity within patient's tolerance;Patient requesting pain meds-RN notified    Home Living Family/patient expects to be discharged to:: Private residence Living Arrangements: Spouse/significant other Available Help at Discharge: Available PRN/intermittently;Family;Friend(s) Type of Home: House Home Access: Ramped entrance     Home Layout: One level Home Equipment: Shower seat;Grab bars - tub/shower;Adaptive equipment;Wheelchair - manual;Walker - Psychologist, educational - 4 wheels      Prior Function Level of Independence: Needs assistance;Independent with assistive device(s)   Gait / Transfers Assistance Needed: modified @ w/c level - using prosthetic leg for transfers  ADL's / Homemaking Assistance Needed: modified independent with exception of wife helping with underwear adn shorts  Comments: on 2L at baseline     Hand Dominance   Dominant Hand: Right    Extremity/Trunk Assessment   Upper Extremity Assessment Upper Extremity Assessment: Defer to OT evaluation    Lower Extremity Assessment Lower Extremity Assessment: RLE deficits/detail;LLE deficits/detail RLE Deficits / Details: wearing shoe and compression stocking (knee hi) able to lift antigravity from hip, flexes and extends  knee and moves ankle, but requests no pressure on leg due to sensitivity RLE Sensation: decreased light touch;history of peripheral neuropathy LLE Deficits / Details: R BKA wearing his liner in bed able to lift antigravity and take resistance    Cervical / Trunk Assessment Cervical / Trunk Assessment: Other exceptions (increased body habitus)  Communication   Communication: No  difficulties  Cognition Arousal/Alertness: Awake/alert Behavior During Therapy: WFL for tasks assessed/performed Overall Cognitive Status: Within Functional Limits for tasks assessed                                        General Comments General comments (skin integrity, edema, etc.): encouraged pt to complete bed level exercises adn increase time OOB in chair if admitted to room. Pt verbalized understanding.    Exercises Other Exercises Other Exercises: issued incentive spirometer and pt performed x 5 consistently getting up to 625; educated to perform x 5 each hour and to use tube to exhale as well for resistance to give back pressure and open up consolidated lung areas   Assessment/Plan    PT Assessment Patient needs continued PT services  PT Problem List Decreased strength;Decreased activity tolerance;Cardiopulmonary status limiting activity;Decreased mobility       PT Treatment Interventions DME instruction;Therapeutic activities;Therapeutic exercise;Patient/family education;Gait training;Functional mobility training;Balance training;Wheelchair mobility training    PT Goals (Current goals can be found in the Care Plan section)  Acute Rehab PT Goals Patient Stated Goal: to improve pain, mobilize as usual PT Goal Formulation: With patient Time For Goal Achievement: 06/04/20 Potential to Achieve Goals: Good    Frequency Min 3X/week   Barriers to discharge        Co-evaluation               AM-PAC PT "6 Clicks" Mobility  Outcome Measure Help needed turning from your back to your side while in a flat bed without using bedrails?: None Help needed moving from lying on your back to sitting on the side of a flat bed without using bedrails?: None Help needed moving to and from a bed to a chair (including a wheelchair)?: A Little Help needed standing up from a chair using your arms (e.g., wheelchair or bedside chair)?: None Help needed to walk in hospital  room?: A Little Help needed climbing 3-5 steps with a railing? : A Lot 6 Click Score: 20    End of Session Equipment Utilized During Treatment: Oxygen Activity Tolerance: Patient limited by pain Patient left: in bed   PT Visit Diagnosis: Muscle weakness (generalized) (M62.81)    Time: 1100-1114 PT Time Calculation (min) (ACUTE ONLY): 14 min   Charges:   PT Evaluation $PT Eval Moderate Complexity: 1 Mod          Cyndi Cristoval Teall, PT Acute Rehabilitation Services [phone] Office:[phone] 05/21/2020   Reginia Naas 05/21/2020, 12:57 PM

## 2020-05-21 NOTE — ED Notes (Signed)
Pt requested wheelchair transport to lobby area to await wife's arrival to transport him home.

## 2020-05-21 NOTE — ED Notes (Signed)
Pt up to bedside commode. O2 at 100%

## 2020-05-21 NOTE — Progress Notes (Signed)
Occupational Therapy Evaluation Patient Details Name: Dale [name] MRN: [id number] DOB: 12/09/1965 Today's Date: 05/21/2020    History of Present Illness 55 y.o. male presenting with acute on chronic hypoxemic respiratory failure in the setting of COVID-19 infection. PMH is significant for ESRD on HD s/p failed renal transplant, PAD s/p left BKA, CAD, T1DM, HLD, anemia, hypothyroidism, OSA, obesity   Clinical Impression   PTA pt lives with his wife and is modified independent @ wc level with use of RW for stand pivot transfers while wearing his prosthetic leg. Wife assists with LB ADL as needed. Pt in significant pain from "contrast" with increased WOB noted with activity/appears to be related to pain as well. SpO2 appears to maintain in the 90s on 2L, which is his baseline, during activity. Will follow acutely and recommend DC home with [place]. Pt in agreement. Discussed with CM and PT.     Follow Up Recommendations  Home health OT;Supervision - Intermittent (S for functional transfers)    Equipment Recommendations  None recommended by OT    Recommendations for Other Services       Precautions / Restrictions Precautions Precautions: Fall      Mobility Bed Mobility Overal bed mobility: Needs Assistance Bed Mobility: Supine to Sit;Sit to Supine     Supine to sit: Supervision Sit to supine: Supervision        Transfers Overall transfer level: Needs assistance Equipment used: Rolling walker (2 wheeled) Transfers: Sit to/from Stand Sit to Stand: Min guard              Balance Overall balance assessment: Needs assistance   Sitting balance-Leahy Scale: Good       Standing balance-Leahy Scale: Poor Standing balance comment: reliant on external support                           ADL either performed or assessed with clinical judgement   ADL Overall ADL's : Needs assistance/impaired     Grooming: Set up;Sitting   Upper Body Bathing: Set  up;Sitting   Lower Body Bathing: Minimal assistance;Sit to/from stand   Upper Body Dressing : Set up;Sitting   Lower Body Dressing: Minimal assistance;Sit to/from stand Lower Body Dressing Details (indicate cue type and reason): able to donn/doff prosthetic leg with set up Toilet Transfer: Minimal assistance;RW Toilet Transfer Details (indicate cue type and reason): simulated Toileting- Clothing Manipulation and Hygiene: Moderate assistance       Functional mobility during ADLs: Minimal assistance;Rolling walker General ADL Comments: increased fatigue and WOB with ADL     Vision         Perception     Praxis      Pertinent Vitals/Pain Pain Assessment: Faces Faces Pain Scale: Hurts whole lot Pain Location: burning from contrast Pain Descriptors / Indicators: Burning;Constant;Grimacing Pain Intervention(s): Limited activity within patient's tolerance;Other (comment) (nsg aware; MD discussing pain management)     Hand Dominance Right   Extremity/Trunk Assessment Upper Extremity Assessment Upper Extremity Assessment: Overall WFL for tasks assessed (numbness in ulnar distribution but does not affect function)   Lower Extremity Assessment Lower Extremity Assessment: Defer to PT evaluation (R foot "is numb")   Cervical / Trunk Assessment Cervical / Trunk Assessment: Other exceptions (increased body habitus)   Communication Communication Communication: No difficulties   Cognition Arousal/Alertness: Awake/alert Behavior During Therapy: WFL for tasks assessed/performed Overall Cognitive Status: Within Functional Limits for tasks assessed  General Comments  encouraged pt to complete bed level exercises adn increase time OOB in chair if admitted to room. Pt verbalized understanding.    Exercises     Shoulder Instructions      Home Living Family/patient expects to be discharged to:: Private residence Living  Arrangements: Spouse/significant other Available Help at Discharge: Available PRN/intermittently;Family;Friend(s) Type of Home: House Home Access: Ramped entrance     Home Layout: One level     Bathroom Shower/Tub: Occupational psychologist: Standard Bathroom Accessibility: Yes (w/c "tight fit".has to walk several steps to Toilet) How Accessible: Accessible via walker Home Equipment: [place] - 2 wheels;Shower seat;Grab bars - tub/shower;Adaptive equipment;Wheelchair - Higher education careers adviser: Other (Comment) (toilet aid)        Prior Functioning/Environment Level of Independence: Needs assistance;Independent with assistive device(s)  Gait / Transfers Assistance Needed: modified @ w/c level - using prosthetic leg for trnasers ADL's / Homemaking Assistance Needed: modified independent with exception of wife helping with underwear adn shorts   Comments: on 2L at baseline        OT Problem List: Decreased strength;Decreased activity tolerance;Impaired balance (sitting and/or standing);Decreased safety awareness;Decreased knowledge of use of DME or AE;Cardiopulmonary status limiting activity;Obesity;Impaired sensation;Pain      OT Treatment/Interventions: Self-care/ADL training;Therapeutic exercise;Neuromuscular education;Energy conservation;DME and/or AE instruction;Therapeutic activities;Patient/family education;Balance training    OT Goals(Current goals can be found in the care plan section) Acute Rehab OT Goals Patient Stated Goal: to go home adn return to his "normal" OT Goal Formulation: With patient Time For Goal Achievement: 06/04/20 Potential to Achieve Goals: Good  OT Frequency: Min 2X/week   Barriers to D/C:            Co-evaluation              AM-PAC OT "6 Clicks" Daily Activity     Outcome Measure Help from another person eating meals?: None Help from another person taking care of personal grooming?: A Little Help from another person  toileting, which includes using toliet, bedpan, or urinal?: A Lot Help from another person bathing (including washing, rinsing, drying)?: A Little Help from another person to put on and taking off regular upper body clothing?: A Little Help from another person to put on and taking off regular lower body clothing?: A Little 6 Click Score: 18   End of Session Equipment Utilized During Treatment: Rolling walker;Oxygen (2L) Nurse Communication: Mobility status;Other (comment) (DC needs)  Activity Tolerance: Patient tolerated treatment well Patient left: in bed;with call bell/phone within reach  OT Visit Diagnosis: Unsteadiness on feet (R26.81);Other abnormalities of gait and mobility (R26.89);Muscle weakness (generalized) (M62.81);Pain Pain - part of body:  (entire body)                Time: 1010-1045 OT Time Calculation (min): 35 min Charges:  OT General Charges $OT Visit: 1 Visit OT Evaluation $OT Eval Moderate Complexity: 1 Mod OT Treatments $Self Care/Home Management : 8-22 mins  Maurie Boettcher, OT/L   Acute OT Clinical Specialist Acute Rehabilitation Services Pager [phone] Office [phone]   [facility] 05/21/2020, 11:42 AM

## 2020-05-21 NOTE — Consult Note (Signed)
Reason for Consult:ESRD Referring Physician:  Dr. Zola Button  Chief Complaint: Shortness of breath  Assessment/Plan: 1. ESRD - Usually MWF but recently now on COVID shift, missed treatment yesterday and now dyspneic. Planning on HD 1st shift. 2. COVID - per primary, received treatment recently and now tachypneic requiring O2. Was only on 2L home O2 [place] but now on 3L. 3. Renal osteodysrophy - will check a phos and adjust binders as needed. 4. Anemia - Transfuse as needed. Will dose ESA. 5. DM - on insulin pump usually. 6. HTN - but off antihypertensives and usually hypotensive on dialysis requiring midodrine. 7. Hypothyroidism 8. CASHD s/p NSTEMI. 9. OSA - doesn't use because of hte mask.   HPI: Dale [name] is an 55 y.o. male recent Covid+, ESRD on HD s/p failed renal transplant, PAD s/p left BKA, CAD, T1DM, HLD, anemia, hypothyroidism, OSA, obesity. He is fully vaccinated and boosted  presenting with worsening dyspnea and increasing oxygen requirement (normally on 2L at night only, now using 2-3L throughout the day) over the past 2 to 3 days. He  recently tested positive for COVID-19 on 1/14 and was treated outpatient with Mab infusion (sotrovimab 1/15) and molnupiravir nucleoside analog.  On presentation, he was afebrile, mildly tachypneic, mildly hypertensive, and maintaining good SpO2 on 6L [place].  Inflammatory markers  Higher than previous,  normal lactate and normal WBC.  CXR  mild interstitial edema, CTA chest without evidence of PE and with evidence of multifocal pneumonia versus edema.  Started  on remdesivir and received a dose of hydrocortisone for premedication to go to CT scan (due to allergy to iodinated contrast).   He has not missed any dialysis treatments.  ROS Pertinent items are noted in HPI.  Chemistry and CBC: Creatinine, Ser  Date/Time Value Ref Range Status  05/20/2020 03:27 PM 9.56 (H) 0.61 - 1.24 mg/dL Final  05/08/2020 11:28 AM 4.84 (H) 0.61 - 1.24 mg/dL Final   02/19/2019 09:03 AM 8.60 (H) 0.61 - 1.24 mg/dL Final  12/28/2018 02:49 AM 9.71 (H) 0.61 - 1.24 mg/dL Final  10/16/2018 08:00 AM 9.50 (H) 0.61 - 1.24 mg/dL Final  10/16/2018 07:23 AM 10.20 (H) 0.61 - 1.24 mg/dL Final  08/03/2018 09:09 AM 8.96 (H) 0.61 - 1.24 mg/dL Final    Comment:    DELTA CHECK NOTED DIALYSIS   08/02/2018 01:04 PM 14.31 (H) 0.61 - 1.24 mg/dL Final  08/02/2018 05:42 AM 13.83 (H) 0.61 - 1.24 mg/dL Final  08/01/2018 10:03 PM 12.85 (H) 0.61 - 1.24 mg/dL Final  07/29/2018 01:02 AM 10.48 (H) 0.61 - 1.24 mg/dL Final  07/25/2017 07:42 PM 9.59 (H) 0.61 - 1.24 mg/dL Final  07/18/2017 04:43 AM 8.04 (H) 0.61 - 1.24 mg/dL Final    Comment:    DELTA CHECK NOTED  07/17/2017 05:00 AM 12.18 (H) 0.61 - 1.24 mg/dL Final  07/16/2017 06:15 AM 9.85 (H) 0.61 - 1.24 mg/dL Final  07/15/2017 04:46 AM 7.42 (H) 0.61 - 1.24 mg/dL Final  07/14/2017 04:29 AM 9.94 (H) 0.61 - 1.24 mg/dL Final  07/13/2017 04:55 AM 7.86 (H) 0.61 - 1.24 mg/dL Final  07/12/2017 11:49 AM 5.40 (H) 0.61 - 1.24 mg/dL Final    Comment:    DIALYSIS  07/11/2017 06:29 AM 8.40 (H) 0.61 - 1.24 mg/dL Final  11/29/2016 02:54 PM 8.22 (H) 0.76 - 1.27 mg/dL Final  10/15/2016 07:03 AM 6.76 (H) 0.61 - 1.24 mg/dL Final  10/14/2016 11:07 PM 6.07 (H) 0.61 - 1.24 mg/dL Final  07/26/2016 06:46 AM 7.10 (  H) 0.61 - 1.24 mg/dL Final  04/13/2016 02:58 PM 5.61 (H) 0.61 - 1.24 mg/dL Final  02/28/2016 05:45 AM 5.03 (H) 0.61 - 1.24 mg/dL Final  02/27/2016 08:35 AM 5.72 (H) 0.61 - 1.24 mg/dL Final  02/27/2016 06:34 AM 6.15 (H) 0.61 - 1.24 mg/dL Final  02/26/2016 10:43 AM 4.10 (H) 0.61 - 1.24 mg/dL Final  02/26/2016 08:15 AM 7.03 (H) 0.61 - 1.24 mg/dL Final  02/26/2016 03:01 AM 9.94 (H) 0.61 - 1.24 mg/dL Final  02/25/2016 09:28 PM 9.50 (H) 0.61 - 1.24 mg/dL Final  02/25/2016 07:35 AM 9.05 (H) 0.61 - 1.24 mg/dL Final  02/24/2016 01:30 PM 7.30 (H) 0.61 - 1.24 mg/dL Final  02/05/2016 07:45 AM 4.61 (H) 0.61 - 1.24 mg/dL Final  02/04/2016 04:17  AM 4.68 (H) 0.61 - 1.24 mg/dL Final  02/02/2016 09:30 AM 5.98 (H) 0.61 - 1.24 mg/dL Final  01/29/2016 01:01 PM 3.32 (H) 0.40 - 1.50 mg/dL Final  01/20/2016 07:45 AM 9.64 (H) 0.61 - 1.24 mg/dL Final  01/19/2016 02:39 PM 7.89 (H) 0.61 - 1.24 mg/dL Final  01/12/2016 08:00 AM 6.69 (H) 0.61 - 1.24 mg/dL Final  01/06/2016 10:22 PM 5.12 (H) 0.61 - 1.24 mg/dL Final  01/04/2016 03:31 AM 7.92 (H) 0.61 - 1.24 mg/dL Final  01/03/2016 06:40 AM 5.84 (H) 0.61 - 1.24 mg/dL Final  01/02/2016 04:03 PM 4.18 (H) 0.61 - 1.24 mg/dL Final    Comment:    DELTA CHECK NOTED  01/02/2016 08:17 AM 7.77 (H) 0.61 - 1.24 mg/dL Final  01/02/2016 05:13 AM 7.61 (H) 0.61 - 1.24 mg/dL Final  01/01/2016 03:38 PM 6.31 (H) 0.61 - 1.24 mg/dL Final  01/01/2016 05:14 AM 5.32 (H) 0.61 - 1.24 mg/dL Final  01/01/2016 12:09 AM 6.97 (H) 0.61 - 1.24 mg/dL Final    Comment:    DELTA CHECK NOTED  12/31/2015 06:33 PM 3.92 (H) 0.61 - 1.24 mg/dL Final  12/31/2015 02:48 AM 5.66 (H) 0.61 - 1.24 mg/dL Final   Recent Labs  Lab 05/20/20 1527  NA 133*  K 4.5  CL 92*  CO2 29  GLUCOSE 212*  BUN 40*  CREATININE 9.56*  CALCIUM 8.1*   Recent Labs  Lab 05/20/20 1527  WBC 7.4  NEUTROABS 5.1  HGB 8.8*  HCT 29.9*  MCV 106.0*  PLT 293   Liver Function Tests: Recent Labs  Lab 05/20/20 1527  AST 12*  ALT 14  ALKPHOS 77  BILITOT 0.3  PROT 7.5  ALBUMIN 3.0*   No results for input(s): LIPASE, AMYLASE in the last 168 hours. No results for input(s): AMMONIA in the last 168 hours. Cardiac Enzymes: No results for input(s): CKTOTAL, CKMB, CKMBINDEX, TROPONINI in the last 168 hours. Iron Studies:  Recent Labs    05/20/20 1527  FERRITIN 1,030*   PT/INR: _0 (inr:5)  Xrays/Other Studies: ) Results for orders placed or performed during the hospital encounter of 05/20/20 (from the past 48 hour(s))  Lactic acid, plasma     Status: None   Collection Time: 05/20/20  3:27 PM  Result Value Ref Range   Lactic Acid, Venous 0.8  0.5 - 1.9 mmol/L    Comment: Performed at [facility], [place] [address] [place], [place] [address]  CBC WITH DIFFERENTIAL     Status: Abnormal   Collection Time: 05/20/20  3:27 PM  Result Value Ref Range   WBC 7.4 4.0 - 10.5 K/uL   RBC 2.82 (L) 4.22 - 5.81 MIL/uL   Hemoglobin 8.8 (L) 13.0 - 17.0 g/dL  HCT 29.9 (L) 39.0 - 52.0 %   MCV 106.0 (H) 80.0 - 100.0 fL   MCH 31.2 26.0 - 34.0 pg   MCHC 29.4 (L) 30.0 - 36.0 g/dL   RDW 13.7 11.5 - 15.5 %   Platelets 293 150 - 400 K/uL   nRBC 0.0 0.0 - 0.2 %   Neutrophils Relative % 69 %   Neutro Abs 5.1 1.7 - 7.7 K/uL   Lymphocytes Relative 15 %   Lymphs Abs 1.1 0.7 - 4.0 K/uL   Monocytes Relative 6 %   Monocytes Absolute 0.4 0.1 - 1.0 K/uL   Eosinophils Relative 9 %   Eosinophils Absolute 0.7 (H) 0.0 - 0.5 K/uL   Basophils Relative 1 %   Basophils Absolute 0.1 0.0 - 0.1 K/uL   Immature Granulocytes 0 %   Abs Immature Granulocytes 0.03 0.00 - 0.07 K/uL    Comment: Performed at [facility] [address] [place], [place] [address]  Comprehensive metabolic panel     Status: Abnormal   Collection Time: 05/20/20  3:27 PM  Result Value Ref Range   Sodium 133 (L) 135 - 145 mmol/L   Potassium 4.5 3.5 - 5.1 mmol/L   Chloride 92 (L) 98 - 111 mmol/L   CO2 29 22 - 32 mmol/L   Glucose, Bld 212 (H) 70 - 99 mg/dL    Comment: Glucose reference range applies only to samples taken after fasting for at least 8 hours.   BUN 40 (H) 6 - 20 mg/dL   Creatinine, Ser 9.56 (H) 0.61 - 1.24 mg/dL   Calcium 8.1 (L) 8.9 - 10.3 mg/dL   Total Protein 7.5 6.5 - 8.1 g/dL   Albumin 3.0 (L) 3.5 - 5.0 g/dL   AST 12 (L) 15 - 41 U/L   ALT 14 0 - 44 U/L   Alkaline Phosphatase 77 38 - 126 U/L   Total Bilirubin 0.3 0.3 - 1.2 mg/dL   GFR, Estimated 6 (L) >60 mL/min    Comment: (NOTE) Calculated using the CKD-EPI Creatinine Equation (2021)    Anion gap 12 5 - 15    Comment: Performed at [facility] [address], [place], [place] [address]   D-dimer, quantitative     Status: Abnormal   Collection Time: 05/20/20  3:27 PM  Result Value Ref Range   D-Dimer, Quant 0.87 (H) 0.00 - 0.50 ug/mL-FEU    Comment: (NOTE) At the manufacturer cut-off value of 0.5 g/mL FEU, this assay has a negative predictive value of 95-100%.This assay is intended for use in conjunction with a clinical pretest probability (PTP) assessment model to exclude pulmonary embolism (PE) and deep venous thrombosis (DVT) in outpatients suspected of PE or DVT. Results should be correlated with clinical presentation. Performed at [facility], [place] [address], [place], [place] [address]   Procalcitonin     Status: None   Collection Time: 05/20/20  3:27 PM  Result Value Ref Range   Procalcitonin 1.24 ng/mL    Comment:        Interpretation: PCT > 0.5 ng/mL and <= 2 ng/mL: Systemic infection (sepsis) is possible, but other conditions are known to elevate PCT as well. (NOTE)       Sepsis PCT Algorithm           Lower Respiratory Tract  Infection PCT Algorithm    ----------------------------     ----------------------------         PCT < 0.25 ng/mL                PCT < 0.10 ng/mL          Strongly encourage             Strongly discourage   discontinuation of antibiotics    initiation of antibiotics    ----------------------------     -----------------------------       PCT 0.25 - 0.50 ng/mL            PCT 0.10 - 0.25 ng/mL               OR       >80% decrease in PCT            Discourage initiation of                                            antibiotics      Encourage discontinuation           of antibiotics    ----------------------------     -----------------------------         PCT >= 0.50 ng/mL              PCT 0.26 - 0.50 ng/mL                AND       <80% decrease in PCT             Encourage initiation of                                             antibiotics       Encourage continuation            of antibiotics    ----------------------------     -----------------------------        PCT >= 0.50 ng/mL                  PCT > 0.50 ng/mL               AND         increase in PCT                  Strongly encourage                                      initiation of antibiotics    Strongly encourage escalation           of antibiotics                                     -----------------------------                                           PCT <= 0.25 ng/mL  OR                                        > 80% decrease in PCT                                      Discontinue / Do not initiate                                             antibiotics  Performed at [facility], [place] [address] [place], [place] [address]   Lactate dehydrogenase     Status: None   Collection Time: 05/20/20  3:27 PM  Result Value Ref Range   LDH 99 98 - 192 U/L    Comment: Performed at [facility], [place] [address] [place], [place] [address]  Ferritin     Status: Abnormal   Collection Time: 05/20/20  3:27 PM  Result Value Ref Range   Ferritin 1,030 (H) 24 - 336 ng/mL    Comment: Performed at [facility], [place] [address] [place], [place] [address]  Triglycerides     Status: None   Collection Time: 05/20/20  3:27 PM  Result Value Ref Range   Triglycerides 107 <150 mg/dL    Comment: Performed at [facility] [address], [place], [place] [address]  Fibrinogen     Status: Abnormal   Collection Time: 05/20/20  3:27 PM  Result Value Ref Range   Fibrinogen 670 (H) 210 - 475 mg/dL    Comment: Performed at [facility] [address] [place], [place] [address]  C-reactive protein     Status: Abnormal   Collection Time: 05/20/20  3:27 PM  Result Value Ref Range   CRP 12.9 (H) <1.0 mg/dL    Comment: Performed at [facility], [place] [address], [place], [place] [address]  Lactic acid, plasma     Status: None   Collection  Time: 05/20/20  5:45 PM  Result Value Ref Range   Lactic Acid, Venous 0.8 0.5 - 1.9 mmol/L    Comment: Performed at [facility] [address] [place], [place] [address]  Brain natriuretic peptide     Status: Abnormal   Collection Time: 05/20/20  5:55 PM  Result Value Ref Range   B Natriuretic Peptide 233.8 (H) 0.0 - 100.0 pg/mL    Comment: Performed at [facility] [address] [place], [place] [address]  CBG monitoring, ED     Status: Abnormal   Collection Time: 05/21/20 12:57 AM  Result Value Ref Range   Glucose-Capillary 127 (H) 70 - 99 mg/dL    Comment: Glucose reference range applies only to samples taken after fasting for at least 8 hours.   DG Chest 2 View  Result Date: 05/20/2020 CLINICAL DATA:  Shortness of breath EXAM: CHEST - 2 VIEW COMPARISON:  05/08/2020 FINDINGS: Right IJ approach dialysis catheter tip is at the right atrium. Low lung volumes. Probable mild interstitial edema similar to prior study. No significant pleural effusion. No pneumothorax. Stable cardiomediastinal contours. No acute osseous abnormality. IMPRESSION: Probable mild interstitial edema. Electronically Signed   By:  Macy Mis M.D.   On: 05/20/2020 14:55   CT Angio Chest PE W and/or Wo Contrast  Result Date: 05/20/2020 CLINICAL DATA:  Worsening shortness of breath and nausea after positive COVID diagnosis on 05/08/2020. Pulmonary embolus is suspected with high probability. EXAM: CT ANGIOGRAPHY CHEST WITH CONTRAST TECHNIQUE: Multidetector CT imaging of the chest was performed using the standard protocol during bolus administration of intravenous contrast. Multiplanar CT image reconstructions and MIPs were obtained to evaluate the vascular anatomy. CONTRAST:  42m OMNIPAQUE IOHEXOL 350 MG/ML SOLN COMPARISON:  01/28/2016 CT chest.  Chest radiograph 05/20/2020 FINDINGS: Cardiovascular: Satisfactory opacification of the pulmonary arteries to the segmental level. No evidence of pulmonary  embolism. Mild cardiac enlargement. No pericardial effusion. Scattered aortic calcification. Diffuse coronary artery calcification. No aortic dissection. Focal narrowing of the SVC above the level of the left brachiocephalic. Multiple venous collaterals in the upper chest. Mediastinum/Nodes: Mediastinal lymphadenopathy with right paratracheal nodes 1.7 cm short axis dimension. Similar appearance to previous study. Esophagus is decompressed. Prominent mediastinal fat. Lungs/Pleura: Small bilateral pleural effusions. Patchy airspace disease in the lungs could represent multifocal pneumonia or edema. Upper Abdomen: No acute abnormalities demonstrated in the visualized upper abdomen. Severe atrophy of the right kidney. Musculoskeletal: Degenerative changes in the spine with bridging anterior osteophytes. Diffuse skeletal sclerosis likely representing renal osteodystrophy. Review of the MIP images confirms the above findings. IMPRESSION: 1. No evidence of significant pulmonary embolus. 2. Small bilateral pleural effusions. 3. Patchy airspace disease in the lungs could represent multifocal pneumonia or edema. 4. Mediastinal lymphadenopathy, unchanged since prior study from 2017, likely reactive. 5. Focal narrowing of the SVC above the level of the left brachiocephalic. Multiple venous collaterals in the upper chest. 6. Severe atrophy of the right kidney. 7. Diffuse skeletal sclerosis likely representing renal osteodystrophy. 8. Aortic atherosclerosis. Aortic Atherosclerosis (ICD10-I70.0). Electronically Signed   By: WLucienne CapersM.D.   On: 05/20/2020 23:14    PMH:   Past Medical History:  Diagnosis Date  . (HFpEF) heart failure with preserved ejection fraction ([place]    a. Echo 3/13:  EF 55-60%;  b. Echo (11/15):  Mild LVH, EF 60-65%, no RWMA, Gr 1 DD, MAC, mild LAE, normal RVF, mild RAE;  c.  Echo 5/16:  severe LVH, EF 55-60%, no RWMA, Gr 1 DD, MAC, mild LAE // Echo 7/18: EF 60-65, Gr 1 DD  . Anal fissure   .  Anemia of chronic disease   . Arthritis    "hands, right knee" (03/19/2014) no cartlidge in right knee  . CAD (coronary artery disease)    a. Abnl nuc ->LHC (11/15):  mid to dist Dx 80%, mid RCA 99% (functional CTO), dist RCA 80% >> PCI: balloon angioplasty to mid RCA (could not deliver stent) - staged [facility] b. NSTEMI 12/2015 s/p DES to diagonal.  . Cholecystitis    a. 08/27/2011  . Claustrophobia    when things get around his face.   . Complication of anesthesia   . Difficult int[facility]    [facility]  . Diverticulosis   . Esophageal dysmotility   . ESRD (end stage renal disease) ([place]    a. 1995 s/p cadaveric transplant w/ susbequent failure after 18 yrs;  [facility]  . Fibromyalgia   . Gastroparesis   . GERD (gastroesophageal reflux disease)   . Gout    PMH  . History of blood transfusion   .  History of COVID-19 04/2020   tx with sotrovimab (mAb) and molnupiravir (nucleoside analog)  . History of kidney stones    early 1990's  . HLD (hyperlipidemia)   . Hypertension   . Hypothyroidism   . Inguinal lymphadenopathy    a. bilateral - s/p biopsy 07/2011  . Insulin dependent diabetes mellitus    Type I  . Monilial esophagitis ([place])   . Morbid obesity ([place])   . OSA (obstructive sleep apnea)    adjustable bed and uses O2 L at bedtime  . PAD (peripheral artery disease) (H[facility])    a. s/p L BKA.  [name] Pericardial effusion    a.  Small by CT 08/22/11;  b.  Large by CT 08/27/11. c. Not seen on 11/2015 echo.  . Pneumonia ~ 2007  . PONV (postoperative nausea and vomiting)    vomited once after a procedure.  . T1DM (type 1 diabetes mellitus) ([place])   . Tracheobronchomalacia     PSH:   Past Surgical History:  Procedure Laterality Date  . A/V FISTULAGRAM Left 07/26/2016   Procedure: A/V Fistulagram;  Surgeon: Waynetta Sandy, MD;  Location: [place] [facility] LAB;   Service: Cardiovascular;  Laterality: Left;  . A/V FISTULAGRAM N/A 07/11/2017   Procedure: A/V FISTULAGRAM - Left AV;  Surgeon: Waynetta Sandy, MD;  Location: [facility] CV LAB;  Service: Cardiovascular;  Laterality: N/A;  . AMPUTATION OF REPLICATED TOES Right   . ANGIOPLASTY  04/09/2014   RCA   . APPENDECTOMY  ~ 2006  . ARTERIOVENOUS GRAFT PLACEMENT Left 1993?   forearm  . ARTERIOVENOUS GRAFT PLACEMENT Right 1993?   leg  . ARTERIOVENOUS GRAFT PLACEMENT Left    Thigh  . Arteriovenous Graft Removed Left    Thigh  . AV FISTULA PLACEMENT  07/29/2011   Procedure: ARTERIOVENOUS (AV) FISTULA CREATION;  Surgeon: Mal Misty, MD;  Location: [place];  Service: Vascular;  Laterality: Right;  Brachial cephalic  . AV FISTULA PLACEMENT Left 01/20/2015   Procedure: LIGATION OF RIGHT ARTERIOVENOUS FISTULA WITH EXCISION OF ANEURYSM;  Surgeon: Angelia Mould, MD;  Location: [place];  Service: Vascular;  Laterality: Left;  . AV FISTULA PLACEMENT Left 04/30/2015   Procedure: CREATION OF LEFT ARM BRACHIO-CEPHALIC ARTERIOVENOUS (AV) FISTULA ;  Surgeon: Angelia Mould, MD;  Location: [facility];  Service: Vascular;  Laterality: Left;  . AV Graft PLACEMENT Left 1993?   "attempted one in my wrist; didn't take"  . BASCILIC VEIN TRANSPOSITION Left 11/01/2018   Procedure: FIRST STAGE BASILIC VEIN TRANSPOSITION  LEFT ARM;  Surgeon: Serafina Mitchell, MD;  Location: [place];  Service: Vascular;  Laterality: Left;  . BASCILIC VEIN TRANSPOSITION Left 12/27/2018   Procedure: SECOND STAGE BASILIC VEIN TRANSPOSITION  LEFT ARM;  Surgeon: Serafina Mitchell, MD;  Location: [place];  Service: Vascular;  Laterality: Left;  . BELOW KNEE LEG AMPUTATION Left 2010  . CARDIAC CATHETERIZATION  03/19/2014  . CARDIAC CATHETERIZATION  03/19/2014   Procedure: CORONARY BALLOON ANGIOPLASTY;  Surgeon: Burnell Blanks, MD;  [facility];  Service: Cardiovascular;;  . CARDIAC CATHETERIZATION N/A 01/01/2016   Procedure:  Left Heart Cath and Coronary Angiography;  Surgeon: Troy Sine, MD;  Location: [place] CV LAB;  Service: Cardiovascular;  Laterality: N/A;  . CARDIAC CATHETERIZATION N/A 01/01/2016   Procedure: Coronary Stent Intervention;  Surgeon: Troy Sine, MD;  Location: [place] CV LAB;  Service: Cardiovascular;  Laterality: N/A;  . CARPAL TUNNEL RELEASE Bilateral after  2005  . CATARACT EXTRACTION W/ INTRAOCULAR LENS  IMPLANT, BILATERAL Bilateral 1990's  . COLONOSCOPY  08/29/2011   Procedure: COLONOSCOPY;  Surgeon: Jerene Bears, MD;  Location: [place];  Service: Gastroenterology;  Laterality: N/A;  . COLONOSCOPY WITH PROPOFOL N/A 04/26/2016   Procedure: COLONOSCOPY WITH PROPOFOL;  Surgeon: Jerene Bears, MD;  [facility];  Service: Gastroenterology;  Laterality: N/A;  . CORONARY ANGIOPLASTY WITH STENT PLACEMENT  04/09/2014       ptca/des mid lad   . ESOPHAGOGASTRODUODENOSCOPY N/A 12/25/2015   Procedure: ESOPHAGOGASTRODUODENOSCOPY (EGD);  Surgeon: Mauri Pole, MD;  Location[facility];  Service: Endoscopy;  Laterality: N/A;  bedside  . ESOPHAGOGASTRODUODENOSCOPY (EGD) WITH PROPOFOL N/A 04/26/2016   Procedure: ESOPHAGOGASTRODUODENOSCOPY (EGD) WITH PROPOFOL;  Surgeon: Jerene Bears, MD;  [facility];  Service: Gastroenterology;  Laterality: N/A;  . EYE SURGERY Bilateral    cataract  . FEMORAL ARTERY REPAIR  04/09/2014   ANGIOSEAL  . INSERTION OF DIALYSIS CATHETER  08/01/2011   Procedure: INSERTION OF DIALYSIS CATHETER;  Surgeon: Rosetta Posner, MD;  Location: [place];  Service: Vascular;  Laterality: Right;  insertion of dialysis catheter on right internal jugular vein  . INSERTION OF DIALYSIS CATHETER Right 01/20/2015   Procedure: INSERTION OF DIALYSIS CATHETER - RIGHT INTERNAL JUGULAR ;  Surgeon: Angelia Mould, MD;  Location: [place];  Service: Vascular;  Laterality: Right;  . INSERTION OF DIALYSIS CATHETER Right 08/02/2018   Procedure: Insertion Of Dialysis Catheter;   Surgeon: Angelia Mould, MD;  Location: [facility] OR;  Service: Vascular;  Laterality: Right;  . KIDNEY TRANSPLANT  04/25/1993  . LEFT HEART CATHETERIZATION WITH CORONARY ANGIOGRAM N/A 03/19/2014   Procedure: LEFT HEART CATHETERIZATION WITH CORONARY ANGIOGRAM;  Surgeon: Burnell Blanks, MD;  Location[facility] CATH LAB;  Service: Cardiovascular;  Laterality: N/A;  . LIGATION OF ARTERIOVENOUS  FISTULA Left 11/01/2018   Procedure: Ligation Of  Brachio Cephalic Arteriovenous  Fistula;  Surgeon: Serafina Mitchell, MD;  [facility];  Service: Vascular;  Laterality: Left;  . LIGATION OF ARTERIOVENOUS  FISTULA Left 02/19/2019   Procedure: LIGATION OF ARTERIOVENOUS  FISTULA LEFT ARM;  Surgeon: Angelia Mould, MD;  Location: [place];  Service: Vascular;  Laterality: Left;  . LYMPH NODE BIOPSY  08/10/2011   Procedure: LYMPH NODE BIOPSY;  Surgeon: Merrie Roof, MD;  Location: [place];  Service: General;  Laterality: Left;  left groin lymph biopsy  . NEUROPLASTY / TRANSPOSITION ULNAR NERVE AT ELBOW Left after 2005  . PATCH ANGIOPLASTY Left 08/09/2016   Procedure: LEFT  ARTERIOVENOUS FISTULA PATCH ANGIOPLASTY;  Surgeon: Waynetta Sandy, MD;  Location: [place];  Service: Vascular;  Laterality: Left;  . PERCUTANEOUS CORONARY ROTOBLATOR INTERVENTION (PCI-R) N/A 04/09/2014   Procedure: PERCUTANEOUS CORONARY ROTOBLATOR INTERVENTION (PCI-R);  Surgeon: Burnell Blanks, MD;  Location: [facility] CATH LAB;  Service: Cardiovascular;  Laterality: N/A;  . PERIPHERAL VASCULAR BALLOON ANGIOPLASTY  07/11/2017   Procedure: PERIPHERAL VASCULAR BALLOON ANGIOPLASTY;  Surgeon: Waynetta Sandy, MD;  Location: [place] CV LAB;  Service: Cardiovascular;;  left arm AV fistula  . PERIPHERAL VASCULAR CATHETERIZATION N/A 12/25/2014   Procedure: Upper Extremity Venography;  Surgeon: Conrad South Hill, MD;  Location: [place] CV LAB;  Service: Cardiovascular;  Laterality: N/A;  . PERIPHERAL VASCULAR CATHETERIZATION  Left 02/24/2016   Procedure: Fistulagram;  Surgeon: Waynetta Sandy, MD;  Location: [place] CV LAB;  Service: Cardiovascular;  Laterality: Left;  . PERIPHERAL VASCULAR CATHETERIZATION Left  02/24/2016   Procedure: Peripheral Vascular Intervention;  Surgeon: Waynetta Sandy, MD;  Location: [place] CV LAB;  Service: Cardiovascular;  Laterality: Left;  AV FISTULA  . PERITONEAL CATHETER INSERTION    . PERITONEAL CATHETER REMOVAL    . REVISON OF ARTERIOVENOUS FISTULA Right 3/79/0240   Procedure: PLICATION / REVISION OF ARTERIOVENOUS FISTULA;  Surgeon: Angelia Mould, MD;  Location: [place];  Service: Vascular;  Laterality: Right;  . REVISON OF ARTERIOVENOUS FISTULA Left 09/01/2015   Procedure: SUPERFICIALIZATION OF LEFT ARM BRACHIOCEPHALIC ARTERIOVENOUS FISTULA;  Surgeon: Angelia Mould, MD;  Location: [place];  Service: Vascular;  Laterality: Left;  . REVISON OF ARTERIOVENOUS FISTULA Left 08/09/2016   Procedure: REVISION OF ARTERIAL ANASTOMOSIS OF ARTERIOVENOUS FISTULA;  Surgeon: Waynetta Sandy, MD;  Location: [place];  Service: Vascular;  Laterality: Left;  . SHOULDER ARTHROSCOPY Left ~ 2006   frozen  . THROMBECTOMY W/ EMBOLECTOMY Left 08/02/2018   Procedure: THROMBECTOMY ARTERIOVENOUS FISTULA;  Surgeon: Angelia Mould, MD;  Location: [place];  Service: Vascular;  Laterality: Left;  . TOE AMPUTATION Bilateral after 2005  . UMBILICAL HERNIA REPAIR  1990's  . UPPER EXTREMITY VENOGRAPHY Bilateral 10/16/2018   Procedure: UPPER EXTREMITY VENOGRAPHY;  Surgeon: Serafina Mitchell, MD;  Location: [place] CV LAB;  Service: Cardiovascular;  Laterality: Bilateral;  . VENOGRAM Right 07/28/2011   Procedure: VENOGRAM;  Surgeon: Conrad Azure, MD;  Location: [facility] CATH LAB;  Service: Cardiovascular;  Laterality: Right;  . VITRECTOMY Bilateral     Allergies:  Allergies  Allergen Reactions  . Iodinated Diagnostic Agents Itching, Rash and Other (See Comments)    Systemic  itching and transient rash appearance within hours of receiving contrast    . Prednisone Other (See Comments)    Increases sugar levels  . Adhesive [Tape] Itching and Other (See Comments)    Blisters on arm from adhesive tape at dialysis   . Amoxicillin Itching, Rash and Other (See Comments)    Did it involve swelling of the face/tongue/throat, SOB, or low BP? No Did it involve sudden or severe rash/hives, skin peeling, or any reaction on the inside of your mouth or nose? No Did you need to seek medical attention at a hospital or doctor's office? No When did it last happen?10+ years If all above answers are "NO", may proceed with cephalosporin use.   . Clindamycin/Lincomycin Hives and Itching  . Enalapril Rash and Cough    Cough  . Mobic [Meloxicam] Hives and Rash  . Morphine Hives, Nausea Only and Rash  . Brilinta [Ticagrelor] Nausea And Vomiting  . Ciprofloxacin Itching and Rash  . Clopidogrel Rash  . Codeine Hives, Itching and Rash  . Doxycycline Rash  . Hydrocodone Rash  . Levofloxacin Hives and Itching  . Oxycodone Rash  . Rocephin [Ceftriaxone] Itching    Medications:   Prior to Admission medications   Medication Sig Start Date End Date Taking? Authorizing Provider  acetaminophen (TYLENOL) 500 MG tablet Take 1,000 mg by mouth every 6 (six) hours as needed (pain/headaches.).     [provider]  allopurinol (ZYLOPRIM) 100 MG tablet TAKE 2 TABLETS BY MOUTH DAILY 05/23/19   Bonnita Hollow, MD  aspirin EC 81 MG tablet Take 81 mg by mouth at bedtime.    [provider]  atorvastatin (LIPITOR) 20 MG tablet Take 20 mg by mouth daily. 02/12/19   [provider]  B Complex-C-Folic Acid (DIALYVITE 973) 0.8 MG TABS Take 1 tablet by mouth daily.  [provider]  calcium acetate (PHOSLO) 667 MG capsule Take by mouth See admin instructions. Take 3 Capsules by mouth with each meal daily 09/19/12   [provider]  cetirizine  (ZYRTEC) 10 MG tablet Take 10 mg by mouth daily.    [provider]  diphenhydrAMINE (BENADRYL) 25 MG tablet Take 50 mg by mouth at bedtime.    [provider]  docusate sodium (COLACE) 100 MG capsule Take 100 mg by mouth 2 (two) times daily.     [provider]  famotidine (PEPCID) 20 MG tablet Take 20 mg by mouth 2 (two) times daily.    [provider]  fluticasone (FLONASE) 50 MCG/ACT nasal spray Place 2 sprays into both nostrils daily as needed for allergies.     [provider]  gabapentin (NEURONTIN) 300 MG capsule Take 300 mg by mouth 2 (two) times daily.    [provider]  GLUCAGON EMERGENCY 1 MG injection Inject 1 mg into the muscle daily as needed (low blood sugar).  01/19/12   [provider]  Glucose-Cholecalciferol (RELION GLUCOSE SHOT) LIQD Take 1 Dose by mouth daily as needed (low blood sugar).    [provider]  hydrocortisone 2.5 % cream Apply 1 application topically 2 (two) times daily as needed (rash / itching).    [provider]  HYDROmorphone (DILAUDID) 2 MG tablet Take 2 mg by mouth 2 (two) times daily as needed for moderate pain or severe pain.     [provider]  insulin lispro (HUMALOG) 100 UNIT/ML injection Inject 75-120 Units into the skin continuous. Per insulin pump    [provider]  levothyroxine (SYNTHROID, LEVOTHROID) 112 MCG tablet Take 112 mcg by mouth at bedtime.     [provider]  loperamide (IMODIUM A-D) 2 MG tablet Take 2-4 mg by mouth 4 (four) times daily as needed for diarrhea or loose stools.    [provider]  Melatonin 5 MG TABS Take 5 mg by mouth at bedtime.    [provider]  midodrine (PROAMATINE) 10 MG tablet Take 1 tablet (10 mg total) by mouth 3 (three) times daily. Takes 1 tab in mornings of dialysis and takes 2 tabs 30 mins before starting dialysis (m,w,f) 04/14/16   Elgergawy, Silver Huguenin, MD  nitroGLYCERIN (NITROSTAT)  0.4 MG SL tablet Place 0.4 mg under the tongue every 5 (five) minutes as needed for chest pain.    [provider]  Polyethyl Glycol-Propyl Glycol (SYSTANE ULTRA PF OP) Place 1 drop into both eyes every 2 (two) hours as needed (dry eyes).    [provider]  Polyethyl Glycol-Propyl Glycol 0.4-0.3 % SOLN Place 1 drop into both eyes 4 (four) times daily.    [provider]  polyethylene glycol (MIRALAX / GLYCOLAX) packet Take 17 g by mouth daily. 02/06/16   Regalado, Belkys A, MD  prasugrel (EFFIENT) 10 MG TABS tablet Take 1 tablet (10 mg total) by mouth daily. Please keep upcoming appt in January 2022 before anymore refills. Thank you 03/02/20   Burnell Blanks, MD  promethazine (PHENERGAN) 25 MG tablet Take 25 mg by mouth every 6 (six) hours as needed for nausea or vomiting.    [provider]  rOPINIRole (REQUIP) 2 MG tablet Take 2 mg by mouth at bedtime.    [provider]  sevelamer carbonate (RENVELA) 800 MG tablet Take 800 mg by mouth 3 (three) times daily with meals.    [provider]  testosterone cypionate (DEPOTESTOSTERONE CYPIONATE) 200 MG/ML injection Inject 200 mg into the muscle every 14 (fourteen) days.  09/10/16   [provider]    Discontinued Meds:  There are no discontinued medications.  Social History:  reports that he has never smoked. He has never used smokeless tobacco. He reports current alcohol use. He reports that he does not use drugs.  Family History:   Family History  Problem Relation Age of Onset  . Colon polyps Father   . Diabetes Father   . Hypertension Father   . Hypertension Mother   . Diabetes Mother   . Hyperlipidemia Mother   . Breast cancer Maternal Aunt   . Bone cancer Maternal Aunt   . Liver cancer Maternal Aunt   . Malignant hyperthermia Neg Hx   . Heart attack Neg Hx   . Stroke Neg Hx   . Colon cancer Neg Hx   . Stomach cancer Neg Hx   . Pancreatic cancer Neg Hx   .  Esophageal cancer Neg Hx     Blood pressure (!) 158/73, pulse 88, temperature 98.1 F (36.7 C), temperature source Oral, resp. rate 17, SpO2 100 %. General appearance: alert, cooperative and mild distress Head: Normocephalic, without obvious abnormality, atraumatic Eyes: negative Neck: no adenopathy, no carotid bruit, supple, symmetrical, trachea midline and thyroid not enlarged, symmetric, no tenderness/mass/nodules Back: symmetric, no curvature. ROM normal. No CVA tenderness. Resp: rhonchi bibasilar and bilaterally Chest wall: no tenderness Cardio: irregularly irregular rhythm GI: soft, non-tender; bowel sounds normal; no masses,  no organomegaly Extremities: bl/l amputee Pulses: 2+ and symmetric Access: RIJ TC       Dwana Melena, MD 05/21/2020, 4:36 AM

## 2020-05-21 NOTE — Progress Notes (Signed)
Inpatient Diabetes Program Recommendations  AACE/ADA: New Consensus Statement on Inpatient Glycemic Control (2015)  Target Ranges:  Prepandial:   less than 140 mg/dL      Peak postprandial:   less than 180 mg/dL (1-2 hours)      Critically ill patients:  140 - 180 mg/dL   Lab Results  Component Value Date   GLUCAP 152 (H) 05/21/2020   HGBA1C 7.4 (H) 05/21/2020    Review of Glycemic Control  Diabetes history: DM type 51 since 55 years old Outpatient Diabetes medications: Omnipod insulin pump Insulin pump settings:  Basal settings: 12a 1.65 units/hr 8a 2.4 4p 2.25   Total 50.4 units/24 hours  Insulin to carbohydrate ratio 12a 8      1 unit for every 8 grams of carbs 12p 8       5p 9        1 unit for every 9 grams of carbs  Insulin Sensitivity  12a 20   1 unit drops glucose 20 points 4p 25     1 units drops glucose 25 points  Glucose target 120  He changes pods every 2 days.  Current orders for Inpatient glycemic control:  Novolog 0-9 units tid  A1c 7.4% on 1/27 Endocrinologist: Dr. Eugenia Pancoast with [facility] Recommendations:    - If pt resumes insulin pump utilize insulin pump order set.  -  If pt is to remain off of his insulin pump consider the following:      -  Lantus 45-50 units      -  Novolog 0-15 units tid + hs scale      -  Novolog 8 units tid meal coverage      -  Tradjenta 5 mg Daily   Thanks,  Tama Headings RN, MSN, BC-ADM Inpatient Diabetes Coordinator Team Pager [phone] (8a-5p)

## 2020-05-22 DIAGNOSIS — D509 Iron deficiency anemia, unspecified: Secondary | ICD-10-CM | POA: Diagnosis not present

## 2020-05-22 DIAGNOSIS — N2581 Secondary hyperparathyroidism of renal origin: Secondary | ICD-10-CM | POA: Diagnosis not present

## 2020-05-22 DIAGNOSIS — D631 Anemia in chronic kidney disease: Secondary | ICD-10-CM | POA: Diagnosis not present

## 2020-05-22 DIAGNOSIS — T82818A Embolism of vascular prosthetic devices, implants and grafts, initial encounter: Secondary | ICD-10-CM | POA: Diagnosis not present

## 2020-05-22 DIAGNOSIS — N186 End stage renal disease: Secondary | ICD-10-CM | POA: Diagnosis not present

## 2020-05-22 DIAGNOSIS — Z992 Dependence on renal dialysis: Secondary | ICD-10-CM | POA: Diagnosis not present

## 2020-05-22 DIAGNOSIS — D689 Coagulation defect, unspecified: Secondary | ICD-10-CM | POA: Diagnosis not present

## 2020-05-23 ENCOUNTER — Telehealth: Payer: Self-pay | Admitting: Student in an Organized Health Care Education/Training Program

## 2020-05-23 NOTE — Telephone Encounter (Addendum)
**  After Hours/ Emergency Line Call*  Patient has had shaky hands since before his last discharge. Wife would like to know what to do. Recent CBG 109 before eating. Having jerking movements of bilateral hands and dropping objects. Has been going on since before discharge but did not think much of it so didn't mention before.  Had dialysis for 3 hours yesterday when typically 4hrs 67mns.  Otherwise feels normal.  Talked to wife that could be weakness from recent covid infection, electrolyte disturbance, glucose abnormality, other. Asked if she would like to have him examined in ED or UC today and she preferred to be scheduled for appointment at clinic Monday after dialysis.  Scheduled with Dr. WVanessa Durham1/31 and gave precautions to come in to ED sooner.

## 2020-05-24 ENCOUNTER — Encounter (HOSPITAL_COMMUNITY): Payer: Self-pay

## 2020-05-24 ENCOUNTER — Ambulatory Visit (HOSPITAL_COMMUNITY)
Admission: RE | Admit: 2020-05-24 | Discharge: 2020-05-24 | Disposition: A | Discharge status: Home / Self Care | Payer: Medicare Other | Source: Ambulatory Visit | Attending: Family Medicine | Admitting: Family Medicine

## 2020-05-24 ENCOUNTER — Other Ambulatory Visit: Payer: Self-pay

## 2020-05-24 VITALS — BP 155/62 | HR 93 | Resp 25

## 2020-05-24 DIAGNOSIS — Z992 Dependence on renal dialysis: Secondary | ICD-10-CM

## 2020-05-24 DIAGNOSIS — R258 Other abnormal involuntary movements: Secondary | ICD-10-CM | POA: Insufficient documentation

## 2020-05-24 DIAGNOSIS — U071 COVID-19: Secondary | ICD-10-CM | POA: Insufficient documentation

## 2020-05-24 DIAGNOSIS — T148XXA Other injury of unspecified body region, initial encounter: Secondary | ICD-10-CM | POA: Diagnosis not present

## 2020-05-24 DIAGNOSIS — N186 End stage renal disease: Secondary | ICD-10-CM

## 2020-05-24 LAB — CBC WITH DIFFERENTIAL/PLATELET
Abs Immature Granulocytes: 0.06 10*3/uL (ref 0.00–0.07)
Basophils Absolute: 0 10*3/uL (ref 0.0–0.1)
Basophils Relative: 0 %
Eosinophils Absolute: 1 10*3/uL — ABNORMAL HIGH (ref 0.0–0.5)
Eosinophils Relative: 10 %
HCT: 28.6 % — ABNORMAL LOW (ref 39.0–52.0)
Hemoglobin: 8.5 g/dL — ABNORMAL LOW (ref 13.0–17.0)
Immature Granulocytes: 1 %
Lymphocytes Relative: 7 %
Lymphs Abs: 0.6 10*3/uL — ABNORMAL LOW (ref 0.7–4.0)
MCH: 31.3 pg (ref 26.0–34.0)
MCHC: 29.7 g/dL — ABNORMAL LOW (ref 30.0–36.0)
MCV: 105.1 fL — ABNORMAL HIGH (ref 80.0–100.0)
Monocytes Absolute: 0.6 10*3/uL (ref 0.1–1.0)
Monocytes Relative: 7 %
Neutro Abs: 7.1 10*3/uL (ref 1.7–7.7)
Neutrophils Relative %: 75 %
Platelets: 284 10*3/uL (ref 150–400)
RBC: 2.72 MIL/uL — ABNORMAL LOW (ref 4.22–5.81)
RDW: 15.7 % — ABNORMAL HIGH (ref 11.5–15.5)
WBC: 9.4 10*3/uL (ref 4.0–10.5)
nRBC: 0 % (ref 0.0–0.2)

## 2020-05-24 LAB — COMPREHENSIVE METABOLIC PANEL
ALT: 145 U/L — ABNORMAL HIGH (ref 0–44)
AST: 50 U/L — ABNORMAL HIGH (ref 15–41)
Albumin: 2.9 g/dL — ABNORMAL LOW (ref 3.5–5.0)
Alkaline Phosphatase: 651 U/L — ABNORMAL HIGH (ref 38–126)
Anion gap: 14 (ref 5–15)
BUN: 45 mg/dL — ABNORMAL HIGH (ref 6–20)
CO2: 26 mmol/L (ref 22–32)
Calcium: 8.4 mg/dL — ABNORMAL LOW (ref 8.9–10.3)
Chloride: 94 mmol/L — ABNORMAL LOW (ref 98–111)
Creatinine, Ser: 11.33 mg/dL — ABNORMAL HIGH (ref 0.61–1.24)
GFR, Estimated: 5 mL/min — ABNORMAL LOW (ref >60)
Glucose, Bld: 236 mg/dL — ABNORMAL HIGH (ref 70–99)
Potassium: 4.6 mmol/L (ref 3.5–5.1)
Sodium: 134 mmol/L — ABNORMAL LOW (ref 135–145)
Total Bilirubin: 2.1 mg/dL — ABNORMAL HIGH (ref 0.3–1.2)
Total Protein: 7.8 g/dL (ref 6.5–8.1)

## 2020-05-24 MED ORDER — TRIAMCINOLONE ACETONIDE 0.1 % EX CREA: 1.0000 "application " | TOPICAL_CREAM | Freq: Two times a day (BID) | CUTANEOUS | 0 refills | Status: DC

## 2020-05-24 NOTE — Progress Notes (Deleted)
    SUBJECTIVE:   CHIEF COMPLAINT / HPI:   Hospital follow-up Patient is a pleasant 55 year old male the presents today for hospital follow-up after being discharged for acute on chronic hypoxic respiratory failure in setting of COVID-19.  He was test positive for COVID-19 on 1/14 and did receive monoclonal antibodies as an outpatient.  During his hospitalization he was initiated on 6 L of oxygen but was able to wean to his home oxygen of 2 L.  He had missed dialysis and when stabilized was discharged home to continue his outpatient HD.  Follow-up task recommended from discharge summary: -Recommend tapering chronic opioids -Recommend regular physical activity and weight loss to improve his cardiac condition.  PERTINENT  PMH / PSH: ***  OBJECTIVE:   There were no vitals taken for this visit.   General: NAD, pleasant, able to participate in exam Cardiac: RRR, no murmurs. Respiratory: CTAB, normal effort, No wheezes, rales or rhonchi Abdomen: Bowel sounds present, nontender, nondistended, no hepatosplenomegaly. Extremities: no edema or cyanosis. Psych: Normal affect and mood  ASSESSMENT/PLAN:   No problem-specific Assessment & Plan notes found for this encounter.   Assessment: 55 year old male with a baseline 2 L home oxygen requirement presenting for hospital follow-up after being discharged for acute on chronic hypoxic respiratory failure in the setting of COVID-19.  Patient is outside of the infectious point of COVID-19 this was diagnosed on 1/14.  At this time he continues on his home oxygen requirement. Plan: -Discussed return precautions and ongoing monitoring for patient's breathing status -Recommended regular physical activity as he is able as this should help number of his medical conditions.  Patient also was noted in the discharge summary to be on chronic narcotic medications with recommendation to wean these down as able.  I have discussed this with the patient and  informed him that his PCP will likely have ongoing discussions with him about weaning these down.***  Lurline Del, DO [place]    This note was prepared using Dragon voice recognition software and may include unintentional dictation errors due to the inherent limitations of voice recognition software.

## 2020-05-24 NOTE — ED Triage Notes (Signed)
Pt presents with blisters and jerky movements in arms x 2 days, right leg started today. Pt is concern as he had a CAT scan with contrast and states he had blisters before after using contrast for scan before and also he believe it can be related to [place] as e had COVID on 05/08/2020.

## 2020-05-24 NOTE — Patient Instructions (Incomplete)
It was great to see you! Thank you for allowing me to participate in your care!  Our plans for today:  -Today we discussed your hospital stay and follow-up on your breathing status.  It precursors that you are breathing close to your baseline at this time.  I would like for you to continue to monitor how you are doing and let us know if you have any worsening. -As we discussed we would like to wean down your chronic narcotic medications and your PCP will have future discussions with you about continuing to do so -It is very important to try to participate in some exercise as you are able as this will help with your heart and your lungs overall.  We are checking some labs today, I will call you if they are abnormal will send you a MyChart message or a letter if they are normal.  If you do not hear about your labs in the next 2 weeks please let us know.***  Take care and seek immediate care sooner if you develop any concerns.   Dr. Lurline Del, [place]

## 2020-05-24 NOTE — ED Provider Notes (Signed)
[place]    CSN: [id number] Arrival date & time: 05/24/20  1339      History   Chief Complaint Chief Complaint  Patient presents with  . Appointment    1400  . Blister  . Jerky movements    HPI Dale [name] is a 55 y.o. male.   Patient with complicated medical history including ESRD on dialysis, DM1, CHF, chronic respiratory failure currently oxygen dependent, etc with recent history of pneumonia and COVID 19 infection with multiple admissions the past month. Most recent admission 05/20/20. States since this visit he's been slowly improving, still weak and fatigued but now having redness, swelling and blisters on forearms and lower abdomen and one blister to right lower leg that he feels is from the IV contrast dye given. This always happens to him with dye and he's been taking benadryl which helps some. Also since admission has been having some jerking movements of hands intermittently. He feels this may be related to the gabapentin as he missed dialysis last week due to being in the hospital and has had that happen before. Breathing at current baseline, on continuous O2. Denies fever, CP, SOB, or other new sxs otherwise. Last dialyzed Friday and has appt tomorrow for another.      Past Medical History:  Diagnosis Date  . (HFpEF) heart failure with preserved ejection fraction ([place])    a. Echo 3/13:  EF 55-60%;  b. Echo (11/15):  Mild LVH, EF 60-65%, no RWMA, Gr 1 DD, MAC, mild LAE, normal RVF, mild RAE;  c.  Echo 5/16:  severe LVH, EF 55-60%, no RWMA, Gr 1 DD, MAC, mild LAE // Echo 7/18: EF 60-65, Gr 1 DD  . Anal fissure   . Anemia of chronic disease   . Arthritis    "hands, right knee" (03/19/2014) no cartlidge in right knee  . CAD (coronary artery disease)    a. Abnl nuc ->LHC (11/15):  mid to dist Dx 80%, mid RCA 99% (functional CTO), dist RCA 80% >> PCI: balloon angioplasty to mid RCA (could not deliver stent) - staged PCI Rotoblator atherectomy/DES to [facility].  b. NSTEMI 12/2015 s/p DES to diagonal.  . Cholecystitis    a. 08/27/2011  . Claustrophobia    when things get around his face.   . Complication of anesthesia   . Difficult intubation    only once in 2015 at [facility] haven't had a problem since then  . Diverticulosis   . Esophageal dysmotility   . ESRD (end stage renal disease) ([place])    a. 1995 s/p cadaveric transplant w/ susbequent failure after 18 yrs;  b.Dialysis initiated 07/2011 [facility]  . Fibromyalgia   . Gastroparesis   . GERD (gastroesophageal reflux disease)   . Gout    PMH  . History of blood transfusion   . History of COVID-19 04/2020   tx with sotrovimab (mAb) and molnupiravir (nucleoside analog)  . History of kidney stones    early 1990's  . HLD (hyperlipidemia)   . Hypertension   . Hypothyroidism   . Inguinal lymphadenopathy    a. bilateral - s/p biopsy 07/2011  . Insulin dependent diabetes mellitus    Type I  . Monilial esophagitis ([place])   . Morbid obesity ([place])   . OSA (obstructive sleep apnea)    adjustable bed and uses O2 L at bedtime  . PAD (peripheral artery disease) (HCC)    a. s/p L BKA.  [name] Kitchen Pericardial effusion  a.  Small by CT 08/22/11;  b.  Large by CT 08/27/11. c. Not seen on 11/2015 echo.  . Pneumonia ~ 2007  . PONV (postoperative nausea and vomiting)    vomited once after a procedure.  . T1DM (type 1 diabetes mellitus) ([place])   . Tracheobronchomalacia     Patient Active Problem List   Diagnosis Date Noted  . Acute hypoxemic respiratory failure due to COVID-19 ([place]) 05/20/2020  . Dyspnea 05/08/2020  . Multifocal pneumonia 05/05/2020  . Lymph node enlargement 08/13/2018  . Clotted renal dialysis arteriovenous graft ([place]) 08/02/2018  . Pressure injury of skin 08/02/2018  . Upper airway cough syndrome 08/11/2017  . Morbid obesity due to excess calories ([place]) 08/11/2017  . Chronic respiratory failure with hypoxia ([place]) 08/10/2017  . GERD (gastroesophageal reflux disease) 07/29/2017  .  Rash and nonspecific skin eruption 07/27/2017  . Contact dermatitis 07/27/2017  . ESRD on hemodialysis ([place])   . Sepsis ([place]) 07/12/2017  . T1DM (type 1 diabetes mellitus) ([place]) 07/12/2017  . Hypotension (arterial) 10/15/2016  . Heme positive stool   . IDDM with complications 30/10/6224  . Oxygen dependent 03/24/2016  . Visit for preventive health examination 03/24/2016  . Chronic diastolic heart failure ([place]) 02/25/2016  . Tracheobronchomalacia 02/04/2016  . Anemia of chronic disease   . Iron deficiency anemia   . Gastrointestinal hemorrhage with melena 12/21/2015  . Pulmonary edema 03/09/2015  . Chronic pain 03/09/2015  . Hyperkalemia 03/09/2015  . HLD (hyperlipidemia) 11/27/2014  . ESRD on dialysis ([place]) 09/30/2014  . Coronary artery disease involving native coronary artery of native heart with unstable angina pectoris ([place]) 03/20/2014  . Allergic reaction to dye 03/20/2014  . NSTEMI (non-ST elevated myocardial infarction) ([place]) 03/19/2014  . HTN (hypertension) 02/18/2014  . ESRD (end stage renal disease) ([place]) 08/27/2011  . Abdominal pain 08/27/2011  . Pericardial effusion 08/27/2011  . History of renal transplant 07/22/2011  . S/P BKA (below knee amputation) ([name]) 07/22/2011  . OSA (obstructive sleep apnea) 07/22/2011  . DOE (dyspnea on exertion) 07/22/2011    Past Surgical History:  Procedure Laterality Date  . A/V FISTULAGRAM Left 07/26/2016   Procedure: A/V Fistulagram;  Surgeon: Waynetta Sandy, MD;  Location: [place] [facility];  Service: Cardiovascular;  Laterality: Left;  . A/V FISTULAGRAM N/A 07/11/2017   Procedure: A/V FISTULAGRAM - Left AV;  Surgeon: Waynetta Sandy, MD;  Location: [place] [facility];  Service: Cardiovascular;  Laterality: N/A;  . AMPUTATION OF REPLICATED TOES Right   . ANGIOPLASTY  04/09/2014   RCA   . APPENDECTOMY  ~ 2006  . ARTERIOVENOUS GRAFT PLACEMENT Left 1993?   forearm  . ARTERIOVENOUS GRAFT PLACEMENT Right 1993?    leg  . ARTERIOVENOUS GRAFT PLACEMENT Left    Thigh  . Arteriovenous Graft Removed Left    Thigh  . AV FISTULA PLACEMENT  07/29/2011   Procedure: ARTERIOVENOUS (AV) FISTULA CREATION;  Surgeon: Mal Misty, MD;  Location: [place];  Service: Vascular;  Laterality: Right;  Brachial cephalic  . AV FISTULA PLACEMENT Left 01/20/2015   Procedure: LIGATION OF RIGHT ARTERIOVENOUS FISTULA WITH EXCISION OF ANEURYSM;  Surgeon: Angelia Mould, MD;  Location: [place];  Service: Vascular;  Laterality: Left;  . AV FISTULA PLACEMENT Left 04/30/2015   Procedure: CREATION OF LEFT ARM BRACHIO-CEPHALIC ARTERIOVENOUS (AV) FISTULA ;  Surgeon: Angelia Mould, MD;  Location: [place];  Service: Vascular;  Laterality: Left;  . AV Graft PLACEMENT Left 1993?   "attempted one in  my wrist; didn't take"  . BASCILIC VEIN TRANSPOSITION Left 11/01/2018   Procedure: FIRST STAGE BASILIC VEIN TRANSPOSITION  LEFT ARM;  Surgeon: Serafina Mitchell, MD;  Location: [place];  Service: Vascular;  Laterality: Left;  . BASCILIC VEIN TRANSPOSITION Left 12/27/2018   Procedure: SECOND STAGE BASILIC VEIN TRANSPOSITION  LEFT ARM;  Surgeon: Serafina Mitchell, MD;  Location: [place];  Service: Vascular;  Laterality: Left;  . BELOW KNEE LEG AMPUTATION Left 2010  . CARDIAC CATHETERIZATION  03/19/2014  . CARDIAC CATHETERIZATION  03/19/2014   Procedure: CORONARY BALLOON ANGIOPLASTY;  Surgeon: Burnell Blanks, MD;  Location: [facility] CATH LAB;  Service: Cardiovascular;;  . CARDIAC CATHETERIZATION N/A 01/01/2016   Procedure: Left Heart Cath and Coronary Angiography;  Surgeon: Troy Sine, MD;  Location: [place] CV LAB;  Service: Cardiovascular;  Laterality: N/A;  . CARDIAC CATHETERIZATION N/A 01/01/2016   Procedure: Coronary Stent Intervention;  Surgeon: Troy Sine, MD;  Location: [place] [facility];  Service: Cardiovascular;  Laterality: N/A;  . CARPAL TUNNEL RELEASE Bilateral after 2005  . CATARACT EXTRACTION W/ INTRAOCULAR LENS  IMPLANT,  BILATERAL Bilateral 1990's  . COLONOSCOPY  08/29/2011   Procedure: COLONOSCOPY;  Surgeon: Jerene Bears, MD;  Location: [place];  Service: Gastroenterology;  Laterality: N/A;  . COLONOSCOPY WITH PROPOFOL N/A 04/26/2016   Procedure: COLONOSCOPY WITH PROPOFOL;  Surgeon: Jerene Bears, MD;  Location: [facility] ENDOSCOPY;  Service: Gastroenterology;  Laterality: N/A;  . CORONARY ANGIOPLASTY WITH STENT PLACEMENT  04/09/2014       ptca/des mid lad   . ESOPHAGOGASTRODUODENOSCOPY N/A 12/25/2015   Procedure: ESOPHAGOGASTRODUODENOSCOPY (EGD);  Surgeon: Mauri Pole, MD;  Location: [facility] ENDOSCOPY;  Service: Endoscopy;  Laterality: N/A;  bedside  . ESOPHAGOGASTRODUODENOSCOPY (EGD) WITH PROPOFOL N/A 04/26/2016   Procedure: ESOPHAGOGASTRODUODENOSCOPY (EGD) WITH PROPOFOL;  Surgeon: Jerene Bears, MD;  Location: [facility]OSCOPY;  Service: Gastroenterology;  Laterality: N/A;  . EYE SURGERY Bilateral    cataract  . FEMORAL ARTERY REPAIR  04/09/2014   ANGIOSEAL  . INSERTION OF DIALYSIS CATHETER  08/01/2011   Procedure: INSERTION OF DIALYSIS CATHETER;  Surgeon: Rosetta Posner, MD;  Location: [place];  Service: Vascular;  Laterality: Right;  insertion of dialysis catheter on right internal jugular vein  . INSERTION OF DIALYSIS CATHETER Right 01/20/2015   Procedure: INSERTION OF DIALYSIS CATHETER - RIGHT INTERNAL JUGULAR ;  Surgeon: Angelia Mould, MD;  Location: [place];  Service: Vascular;  Laterality: Right;  . INSERTION OF DIALYSIS CATHETER Right 08/02/2018   Procedure: Insertion Of Dialysis Catheter;  Surgeon: Angelia Mould, MD;  Location: [facility] OR;  Service: Vascular;  Laterality: Right;  . KIDNEY TRANSPLANT  04/25/1993  . LEFT HEART CATHETERIZATION WITH CORONARY ANGIOGRAM N/A 03/19/2014   Procedure: LEFT HEART CATHETERIZATION WITH CORONARY ANGIOGRAM;  Surgeon: Burnell Blanks, MD;  Location: [facility] CATH LAB;  Service: Cardiovascular;  Laterality: N/A;  . LIGATION OF ARTERIOVENOUS  FISTULA Left 11/01/2018   Procedure:  Ligation Of  Brachio Cephalic Arteriovenous  Fistula;  Surgeon: Serafina Mitchell, MD;  Location: [facility] OR;  Service: Vascular;  Laterality: Left;  . LIGATION OF ARTERIOVENOUS  FISTULA Left 02/19/2019   Procedure: LIGATION OF ARTERIOVENOUS  FISTULA LEFT ARM;  Surgeon: Angelia Mould, MD;  Location: [place];  Service: Vascular;  Laterality: Left;  . LYMPH NODE BIOPSY  08/10/2011   Procedure: LYMPH NODE BIOPSY;  Surgeon: Merrie Roof, MD;  Location: [place];  Service: General;  Laterality: Left;  left groin lymph biopsy  . NEUROPLASTY / TRANSPOSITION ULNAR NERVE AT ELBOW Left after 2005  . PATCH ANGIOPLASTY Left 08/09/2016   Procedure: LEFT  ARTERIOVENOUS FISTULA PATCH ANGIOPLASTY;  Surgeon: Waynetta Sandy, MD;  Location: [place];  Service: Vascular;  Laterality: Left;  . PERCUTANEOUS CORONARY ROTOBLATOR INTERVENTION (PCI-R) N/A 04/09/2014   Procedure: PERCUTANEOUS CORONARY ROTOBLATOR INTERVENTION (PCI-R);  Surgeon: Burnell Blanks, MD;  Location: [facility] CATH LAB;  Service: Cardiovascular;  Laterality: N/A;  . PERIPHERAL VASCULAR BALLOON ANGIOPLASTY  07/11/2017   Procedure: PERIPHERAL VASCULAR BALLOON ANGIOPLASTY;  Surgeon: Waynetta Sandy, MD;  Location: [place] CV LAB;  Service: Cardiovascular;;  left arm AV fistula  . PERIPHERAL VASCULAR CATHETERIZATION N/A 12/25/2014   Procedure: Upper Extremity Venography;  Surgeon: Conrad Berry Hill, MD;  Location: [place] [facility];  Service: Cardiovascular;  Laterality: N/A;  . PERIPHERAL VASCULAR CATHETERIZATION Left 02/24/2016   Procedure: Fistulagram;  Surgeon: Waynetta Sandy, MD;  Location: [place] [facility];  Service: Cardiovascular;  Laterality: Left;  . PERIPHERAL VASCULAR CATHETERIZATION Left 02/24/2016   Procedure: Peripheral Vascular Intervention;  Surgeon: Waynetta Sandy, MD;  Location: [place] [facility];  Service: Cardiovascular;  Laterality: Left;  AV FISTULA  . PERITONEAL CATHETER INSERTION    . PERITONEAL  CATHETER REMOVAL    . REVISON OF ARTERIOVENOUS FISTULA Right 8/33/8250   Procedure: PLICATION / REVISION OF ARTERIOVENOUS FISTULA;  Surgeon: Angelia Mould, MD;  Location: [place];  Service: Vascular;  Laterality: Right;  . REVISON OF ARTERIOVENOUS FISTULA Left 09/01/2015   Procedure: SUPERFICIALIZATION OF LEFT ARM BRACHIOCEPHALIC ARTERIOVENOUS FISTULA;  Surgeon: Angelia Mould, MD;  Location: [place];  Service: Vascular;  Laterality: Left;  . REVISON OF ARTERIOVENOUS FISTULA Left 08/09/2016   Procedure: REVISION OF ARTERIAL ANASTOMOSIS OF ARTERIOVENOUS FISTULA;  Surgeon: Waynetta Sandy, MD;  Location: [place];  Service: Vascular;  Laterality: Left;  . SHOULDER ARTHROSCOPY Left ~ 2006   frozen  . THROMBECTOMY W/ EMBOLECTOMY Left 08/02/2018   Procedure: THROMBECTOMY ARTERIOVENOUS FISTULA;  Surgeon: Angelia Mould, MD;  Location: [place];  Service: Vascular;  Laterality: Left;  . TOE AMPUTATION Bilateral after 2005  . UMBILICAL HERNIA REPAIR  1990's  . UPPER EXTREMITY VENOGRAPHY Bilateral 10/16/2018   Procedure: UPPER EXTREMITY VENOGRAPHY;  Surgeon: Serafina Mitchell, MD;  Location: [place] CV LAB;  Service: Cardiovascular;  Laterality: Bilateral;  . VENOGRAM Right 07/28/2011   Procedure: VENOGRAM;  Surgeon: Conrad Muddy, MD;  Location: [name] CATH LAB;  Service: Cardiovascular;  Laterality: Right;  . VITRECTOMY Bilateral        Home Medications    Prior to Admission medications   Medication Sig Start Date End Date Taking? Authorizing Provider  triamcinolone (KENALOG) 0.1 % Apply 1 application topically 2 (two) times daily. 05/24/20  Yes Volney American, PA-C  acetaminophen (TYLENOL) 500 MG tablet Take 1,000 mg by mouth every 6 (six) hours as needed (pain/headaches.).     [provider]  allopurinol (ZYLOPRIM) 100 MG tablet Take 1 tablet (100 mg total) by mouth every other day. 05/21/20   Doristine Mango L, DO  aspirin EC 81 MG tablet Take 81 mg by mouth at  bedtime.    [provider]  atorvastatin (LIPITOR) 20 MG tablet Take 20 mg by mouth daily. 02/12/19   [provider]  B Complex-C-Folic Acid (DIALYVITE 539) 0.8 MG TABS Take 1 tablet by mouth daily.    [provider]  calcium acetate (PHOSLO) [phone]  MG capsule Take 2,001 mg by mouth 3 (three) times daily with meals. 09/19/12   [provider]  cetirizine (ZYRTEC) 10 MG tablet Take 10 mg by mouth daily.    [provider]  famotidine (PEPCID) 20 MG tablet Take 20 mg by mouth 2 (two) times daily.    [provider]  fluticasone (FLONASE) 50 MCG/ACT nasal spray Place 2 sprays into both nostrils daily as needed for allergies.     [provider]  gabapentin (NEURONTIN) 300 MG capsule Take 1 capsule (300 mg total) by mouth 2 (two) times daily as needed. 05/21/20   Anderson, Chelsey L, DO  GLUCAGON EMERGENCY 1 MG injection Inject 1 mg into the muscle daily as needed (low blood sugar).  Patient not taking: No sig reported 01/19/12   [provider]  Glucose-Cholecalciferol (RELION GLUCOSE SHOT) LIQD Take 1 Dose by mouth daily as needed (low blood sugar). Patient not taking: No sig reported    [provider]  HYDROmorphone (DILAUDID) 2 MG tablet Take 1 tablet (2 mg total) by mouth daily as needed for severe pain. 05/21/20   Anderson, Chelsey L, DO  insulin lispro (HUMALOG) 100 UNIT/ML injection Inject 75-120 Units into the skin continuous. Per insulin pump    [provider]  levothyroxine (SYNTHROID, LEVOTHROID) 112 MCG tablet Take 112 mcg by mouth at bedtime.     [provider]  loperamide (IMODIUM A-D) 2 MG tablet Take 2-4 mg by mouth 4 (four) times daily as needed for diarrhea or loose stools.    [provider]  Melatonin 5 MG TABS Take 5 mg by mouth at bedtime.    [provider]  midodrine (PROAMATINE) 10 MG tablet Take 1 tablet (10 mg total) by mouth 3 (three) times daily. Takes 1 tab  in mornings of dialysis and takes 2 tabs 30 mins before starting dialysis (m,w,f) 04/14/16   Elgergawy, Silver Huguenin, MD  nitroGLYCERIN (NITROSTAT) 0.4 MG SL tablet Place 0.4 mg under the tongue every 5 (five) minutes as needed for chest pain. Patient not taking: No sig reported    [provider]  Polyethyl Glycol-Propyl Glycol (SYSTANE ULTRA PF OP) Place 1 drop into both eyes 4 (four) times daily as needed (dry eyes).    [provider]  Polyethyl Glycol-Propyl Glycol 0.4-0.3 % SOLN Place 1 drop into both eyes 2 (two) times daily.    [provider]  polyethylene glycol (MIRALAX / GLYCOLAX) 17 g packet Take 17 g by mouth daily as needed. 05/21/20   Anderson, Chelsey L, DO  prasugrel (EFFIENT) 10 MG TABS tablet Take 1 tablet (10 mg total) by mouth daily. Please keep upcoming appt in January 2022 before anymore refills. Thank you 03/02/20   Burnell Blanks, MD  promethazine (PHENERGAN) 25 MG tablet Take 25 mg by mouth every 6 (six) hours as needed for nausea or vomiting.    [provider]  rOPINIRole (REQUIP) 2 MG tablet Take 2 mg by mouth every evening.    [provider]  sevelamer carbonate (RENVELA) 800 MG tablet Take 1,600 mg by mouth 3 (three) times daily with meals.    [provider]  testosterone cypionate (DEPOTESTOSTERONE CYPIONATE) 200 MG/ML injection Inject 200 mg into the muscle every 14 (fourteen) days.  09/10/16   [provider]    Family History Family History  Problem Relation Age of Onset  . Colon polyps Father   . Diabetes Father   . Hypertension Father   .  Hypertension Mother   . Diabetes Mother   . Hyperlipidemia Mother   . Breast cancer Maternal Aunt   . Bone cancer Maternal Aunt   . Liver cancer Maternal Aunt   . Malignant hyperthermia Neg Hx   . Heart attack Neg Hx   . Stroke Neg Hx   . Colon cancer Neg Hx   . Stomach cancer Neg Hx   . Pancreatic cancer Neg Hx   . Esophageal cancer Neg Hx      Social History Social History   Tobacco Use  . Smoking status: Never Smoker  . Smokeless tobacco: Never Used  Vaping Use  . Vaping Use: Never used  Substance Use Topics  . Alcohol use: Yes    Comment: rare  . Drug use: No     Allergies   Iodinated diagnostic agents, Prednisone, Adhesive [tape], Amoxicillin, Clindamycin/lincomycin, Enalapril, Mobic [meloxicam], Morphine, Brilinta [ticagrelor], Ciprofloxacin, Clopidogrel, Codeine, Doxycycline, Hydrocodone, Levofloxacin, Oxycodone, and Rocephin [ceftriaxone]   Review of Systems Review of Systems PER HPI   Physical Exam Triage Vital Signs ED Triage Vitals  Enc Vitals Group     BP 05/24/20 1356 (!) 155/62     Pulse Rate 05/24/20 1356 93     Resp 05/24/20 1356 (!) 25     Temp --      Temp src --      SpO2 05/24/20 1356 99 %     Weight --      Height --      Head Circumference --      Peak Flow --      Pain Score 05/24/20 1424 0     Pain Loc --      Pain Edu? --      Excl. in [place]? --    No data found.  Updated Vital Signs BP (!) 155/62 (BP Location: Left Arm)   Pulse 93   Resp (!) 25   SpO2 99%   Visual Acuity Right Eye Distance:   Left Eye Distance:   Bilateral Distance:    Right Eye Near:   Left Eye Near:    Bilateral Near:     Physical Exam Vitals and nursing note reviewed.  Constitutional:      General: He is not in acute distress.    Appearance: Normal appearance.  HENT:     Head: Atraumatic.     Nose: Nose normal.     Mouth/Throat:     Mouth: Mucous membranes are moist.     Pharynx: Oropharynx is clear.  Eyes:     Extraocular Movements: Extraocular movements intact.     Conjunctiva/sclera: Conjunctivae normal.  Cardiovascular:     Rate and Rhythm: Normal rate and regular rhythm.  Pulmonary:     Effort: Pulmonary effort is normal.     Breath sounds: Normal breath sounds.  Musculoskeletal:     Cervical back: Normal range of motion and neck supple.     Comments: In wheelchair, s/p bka  left knee with prosthetic  Skin:    General: Skin is warm.     Findings: Rash present.     Comments: Diffuse erythema, mild edema and closed blisters sporadically on forearms and lower abdomen. One open blister on right lower leg near ankle. No drainage from these sites or evidence of bacterial infection  Neurological:     General: No focal deficit present.     Mental Status: He is oriented to person, place, and time.     Comments: Intermittent mild jerking  motions most notable of right hand both with rest and intention  Psychiatric:        Mood and Affect: Mood normal.        Thought Content: Thought content normal.        Judgment: Judgment normal.     UC Treatments / Results  Labs (all labs ordered are listed, but only abnormal results are displayed) Labs Reviewed  CBC WITH DIFFERENTIAL/PLATELET  COMPREHENSIVE METABOLIC PANEL    EKG   Radiology No results found.  Procedures Procedures (including critical care time)  Medications Ordered in UC Medications - No data to display  Initial Impression / Assessment and Plan / UC Course  I have reviewed the triage vital signs and the nursing notes.  Pertinent labs & imaging results that were available during my care of the patient were reviewed by me and considered in my medical decision making (see chart for details).     Triamcinolone cream given for probable IV contrast dye allergic reaction causing erythema, edema and blisters. Continue antihistamines, close monitoring. CBC and CMP today for recheck but agree that gabapentin or another one of his medications may be causing his jerking motions given build up in system from missing dialysis. Hold for the next few days to see if improving. Continue dialysis back on normal schedule. Has PCP f/u scheduled for tomorrow to recheck these things. Return precautions reviewed if worsening in meantime.   Final Clinical Impressions(s) / UC Diagnoses   Final diagnoses:  Blister   Involuntary jerky movements  ESRD on dialysis [facility])  COVID-19   Discharge Instructions   None    ED Prescriptions    Medication Sig Dispense Auth. Provider   triamcinolone (KENALOG) 0.1 % Apply 1 application topically 2 (two) times daily. 60 g Volney American, [place]     PDMP not reviewed this encounter.   Volney American, [place] 05/24/20 [phone]

## 2020-05-25 ENCOUNTER — Ambulatory Visit: Payer: Medicare Other | Admitting: Family Medicine

## 2020-05-25 ENCOUNTER — Emergency Department (HOSPITAL_COMMUNITY)
Admission: EM | Admit: 2020-05-25 | Discharge: 2020-05-25 | Disposition: A | Discharge status: Home / Self Care | Payer: Medicare Other | Attending: Emergency Medicine | Admitting: Emergency Medicine

## 2020-05-25 ENCOUNTER — Other Ambulatory Visit: Payer: Self-pay

## 2020-05-25 ENCOUNTER — Encounter (HOSPITAL_COMMUNITY): Payer: Self-pay | Admitting: *Deleted

## 2020-05-25 DIAGNOSIS — Z95828 Presence of other vascular implants and grafts: Secondary | ICD-10-CM | POA: Insufficient documentation

## 2020-05-25 DIAGNOSIS — Z7982 Long term (current) use of aspirin: Secondary | ICD-10-CM | POA: Diagnosis not present

## 2020-05-25 DIAGNOSIS — Z961 Presence of intraocular lens: Secondary | ICD-10-CM | POA: Insufficient documentation

## 2020-05-25 DIAGNOSIS — D631 Anemia in chronic kidney disease: Secondary | ICD-10-CM | POA: Insufficient documentation

## 2020-05-25 DIAGNOSIS — I2510 Atherosclerotic heart disease of native coronary artery without angina pectoris: Secondary | ICD-10-CM | POA: Insufficient documentation

## 2020-05-25 DIAGNOSIS — T8612 Kidney transplant failure: Secondary | ICD-10-CM | POA: Diagnosis not present

## 2020-05-25 DIAGNOSIS — Z8616 Personal history of COVID-19: Secondary | ICD-10-CM | POA: Insufficient documentation

## 2020-05-25 DIAGNOSIS — Z79899 Other long term (current) drug therapy: Secondary | ICD-10-CM | POA: Insufficient documentation

## 2020-05-25 DIAGNOSIS — N186 End stage renal disease: Secondary | ICD-10-CM | POA: Diagnosis not present

## 2020-05-25 DIAGNOSIS — R110 Nausea: Secondary | ICD-10-CM | POA: Insufficient documentation

## 2020-05-25 DIAGNOSIS — Z9862 Peripheral vascular angioplasty status: Secondary | ICD-10-CM | POA: Insufficient documentation

## 2020-05-25 DIAGNOSIS — H52539 Spasm of accommodation, unspecified eye: Secondary | ICD-10-CM | POA: Diagnosis not present

## 2020-05-25 DIAGNOSIS — T82818A Embolism of vascular prosthetic devices, implants and grafts, initial encounter: Secondary | ICD-10-CM | POA: Diagnosis not present

## 2020-05-25 DIAGNOSIS — N2581 Secondary hyperparathyroidism of renal origin: Secondary | ICD-10-CM | POA: Diagnosis not present

## 2020-05-25 DIAGNOSIS — I110 Hypertensive heart disease with heart failure: Secondary | ICD-10-CM | POA: Insufficient documentation

## 2020-05-25 DIAGNOSIS — I214 Non-ST elevation (NSTEMI) myocardial infarction: Secondary | ICD-10-CM | POA: Diagnosis not present

## 2020-05-25 DIAGNOSIS — E039 Hypothyroidism, unspecified: Secondary | ICD-10-CM | POA: Insufficient documentation

## 2020-05-25 DIAGNOSIS — I739 Peripheral vascular disease, unspecified: Secondary | ICD-10-CM | POA: Diagnosis not present

## 2020-05-25 DIAGNOSIS — E109 Type 1 diabetes mellitus without complications: Secondary | ICD-10-CM | POA: Diagnosis not present

## 2020-05-25 DIAGNOSIS — D509 Iron deficiency anemia, unspecified: Secondary | ICD-10-CM | POA: Diagnosis not present

## 2020-05-25 DIAGNOSIS — Z9861 Coronary angioplasty status: Secondary | ICD-10-CM | POA: Diagnosis not present

## 2020-05-25 DIAGNOSIS — Z89512 Acquired absence of left leg below knee: Secondary | ICD-10-CM | POA: Insufficient documentation

## 2020-05-25 DIAGNOSIS — Z940 Kidney transplant status: Secondary | ICD-10-CM | POA: Insufficient documentation

## 2020-05-25 DIAGNOSIS — I5032 Chronic diastolic (congestive) heart failure: Secondary | ICD-10-CM | POA: Diagnosis not present

## 2020-05-25 DIAGNOSIS — D689 Coagulation defect, unspecified: Secondary | ICD-10-CM | POA: Diagnosis not present

## 2020-05-25 DIAGNOSIS — I120 Hypertensive chronic kidney disease with stage 5 chronic kidney disease or end stage renal disease: Secondary | ICD-10-CM | POA: Diagnosis not present

## 2020-05-25 DIAGNOSIS — Z992 Dependence on renal dialysis: Secondary | ICD-10-CM | POA: Diagnosis not present

## 2020-05-25 DIAGNOSIS — D649 Anemia, unspecified: Secondary | ICD-10-CM | POA: Diagnosis not present

## 2020-05-25 LAB — CBC WITH DIFFERENTIAL/PLATELET
Abs Immature Granulocytes: 0.08 10*3/uL — ABNORMAL HIGH (ref 0.00–0.07)
Basophils Absolute: 0 10*3/uL (ref 0.0–0.1)
Basophils Relative: 1 %
Eosinophils Absolute: 0.9 10*3/uL — ABNORMAL HIGH (ref 0.0–0.5)
Eosinophils Relative: 12 %
HCT: 28.6 % — ABNORMAL LOW (ref 39.0–52.0)
Hemoglobin: 8.5 g/dL — ABNORMAL LOW (ref 13.0–17.0)
Immature Granulocytes: 1 %
Lymphocytes Relative: 10 %
Lymphs Abs: 0.8 10*3/uL (ref 0.7–4.0)
MCH: 31.5 pg (ref 26.0–34.0)
MCHC: 29.7 g/dL — ABNORMAL LOW (ref 30.0–36.0)
MCV: 105.9 fL — ABNORMAL HIGH (ref 80.0–100.0)
Monocytes Absolute: 0.5 10*3/uL (ref 0.1–1.0)
Monocytes Relative: 7 %
Neutro Abs: 5.5 10*3/uL (ref 1.7–7.7)
Neutrophils Relative %: 69 %
Platelets: 276 10*3/uL (ref 150–400)
RBC: 2.7 MIL/uL — ABNORMAL LOW (ref 4.22–5.81)
RDW: 16.1 % — ABNORMAL HIGH (ref 11.5–15.5)
WBC: 7.8 10*3/uL (ref 4.0–10.5)
nRBC: 0.3 % — ABNORMAL HIGH (ref 0.0–0.2)

## 2020-05-25 LAB — COMPREHENSIVE METABOLIC PANEL
ALT: 116 U/L — ABNORMAL HIGH (ref 0–44)
AST: 38 U/L (ref 15–41)
Albumin: 2.7 g/dL — ABNORMAL LOW (ref 3.5–5.0)
Alkaline Phosphatase: 752 U/L — ABNORMAL HIGH (ref 38–126)
Anion gap: 15 (ref 5–15)
BUN: 50 mg/dL — ABNORMAL HIGH (ref 6–20)
CO2: 25 mmol/L (ref 22–32)
Calcium: 8.2 mg/dL — ABNORMAL LOW (ref 8.9–10.3)
Chloride: 94 mmol/L — ABNORMAL LOW (ref 98–111)
Creatinine, Ser: 12.39 mg/dL — ABNORMAL HIGH (ref 0.61–1.24)
GFR, Estimated: 4 mL/min — ABNORMAL LOW (ref >60)
Glucose, Bld: 196 mg/dL — ABNORMAL HIGH (ref 70–99)
Potassium: 4.5 mmol/L (ref 3.5–5.1)
Sodium: 134 mmol/L — ABNORMAL LOW (ref 135–145)
Total Bilirubin: 1.7 mg/dL — ABNORMAL HIGH (ref 0.3–1.2)
Total Protein: 7.3 g/dL (ref 6.5–8.1)

## 2020-05-25 LAB — CULTURE, BLOOD (ROUTINE X 2)
Culture: NO GROWTH
Culture: NO GROWTH
Special Requests: ADEQUATE
Special Requests: ADEQUATE

## 2020-05-25 MED ORDER — METOCLOPRAMIDE HCL 5 MG/ML IJ SOLN
10.0000 mg | Freq: Once | INTRAMUSCULAR | Status: AC
  Administered 2020-05-25: 10 mg via INTRAMUSCULAR
  Filled 2020-05-25: qty 2

## 2020-05-25 NOTE — ED Triage Notes (Addendum)
Patient thinks he may have Gabapentin toxicity , states he has been jerking movements for several days. States his wife was trying to help him on the commode last and he fell ,was helped up by EMS Patient is on home o2 @ 2 liters

## 2020-05-25 NOTE — Discharge Instructions (Addendum)
Your evaluation in the emergency department showed need for dialysis without any other concerning abnormality.  Your electrolytes were reassuring.  If your symptoms are due to your gabapentin, dialysis should resolve these complaints.  Call your dialysis center to see about coordinating dialysis for today.  If you are unable to be dialyzed today, hold your gabapentin dose until you are dialyzed.  Follow-up with your primary care doctor for symptom recheck.  You may return for new or concerning symptoms.

## 2020-05-25 NOTE — ED Provider Notes (Signed)
[facility] EMERGENCY DEPARTMENT Provider Note   CSN: [id number] Arrival date & time: 05/25/20  0524     History Chief Complaint  Patient presents with  . Eye Twitching    Dale [name] is a 54 y.o. male.  55 y/o male with hx of ESRD on HD, HFpEF, CAD, GERD, fibromyalgia, hypothyroidism, OSA (does not use CPAP), PAD s/p left BKA, DM and gastroparesis presents to the ED for c/o jerking movements. States this has been persistent for a number of days, but cannot verbalize exactly how long symptoms have been present. Feels they have been subjectively worsening since his last hospital discharge 4 days ago. Notices these symptoms when he is trying to complete tasks, like to pick up and use his phone. C/o some persistent nausea for which he is requesting antiemetics. Feels his symptoms may be related to gabapentin toxicity due to missed dialysis. No new/worsening SOB, chest pain, fevers, vomiting.  Reports last dialysis on Friday for 3 hours; typically is dialyzed for 4 hours 15 minutes per session.  The history is provided by the patient. No language interpreter was used.       Past Medical History:  Diagnosis Date  . (HFpEF) heart failure with preserved ejection fraction ([facility])    a. Echo 3/13:  EF 55-60%;  b. Echo (11/15):  Mild LVH, EF 60-65%, no RWMA, Gr 1 DD, MAC, mild LAE, normal RVF, mild RAE;  c.  Echo 5/16:  severe LVH, EF 55-60%, no RWMA, Gr 1 DD, MAC, mild LAE // Echo 7/18: EF 60-65, Gr 1 DD  . Anal fissure   . Anemia of chronic disease   . Arthritis    "hands, right knee" (03/19/2014) no cartlidge in right knee  . CAD (coronary artery disease)    a. Abnl nuc ->LHC (11/15):  mid to dist Dx 80%, mid RCA 99% (functional CTO), dist RCA 80% >> PCI: balloon angioplasty to mid RCA (could not deliver stent) - staged PCI Rotoblator atherectomy/DES to [facility]. b. NSTEMI 12/2015 s/p DES to diagonal.  . Cholecystitis    a. 08/27/2011  . Claustrophobia    when things get  around his face.   . Complication of anesthesia   . Difficult intubation    only once in 2015 at [facility] haven't had a problem since then  . Diverticulosis   . Esophageal dysmotility   . ESRD (end stage renal disease) ([place])    a. 1995 s/p cadaveric transplant w/ susbequent failure after 18 yrs;  b.Dialysis initiated 07/2011 [facility] M-W-F  . Fibromyalgia   . Gastroparesis   . GERD (gastroesophageal reflux disease)   . Gout    PMH  . History of blood transfusion   . History of COVID-19 04/2020   tx with sotrovimab (mAb) and molnupiravir (nucleoside analog)  . History of kidney stones    early 1990's  . HLD (hyperlipidemia)   . Hypertension   . Hypothyroidism   . Inguinal lymphadenopathy    a. bilateral - s/p biopsy 07/2011  . Insulin dependent diabetes mellitus    Type I  . Monilial esophagitis ([place])   . Morbid obesity ([place])   . OSA (obstructive sleep apnea)    adjustable bed and uses O2 L at bedtime  . PAD (peripheral artery disease) ([facility])    a. s/p L BKA.  [name] Kitchen Pericardial effusion    a.  Small by CT 08/22/11;  b.  Large by CT 08/27/11. c. Not seen on 11/2015  echo.  . Pneumonia ~ 2007  . PONV (postoperative nausea and vomiting)    vomited once after a procedure.  . T1DM (type 1 diabetes mellitus) ([place])   . Tracheobronchomalacia     Patient Active Problem List   Diagnosis Date Noted  . Acute hypoxemic respiratory failure due to COVID-19 ([place]) 05/20/2020  . Dyspnea 05/08/2020  . Multifocal pneumonia 05/05/2020  . Lymph node enlargement 08/13/2018  . Clotted renal dialysis arteriovenous graft ([place]) 08/02/2018  . Pressure injury of skin 08/02/2018  . Upper airway cough syndrome 08/11/2017  . Morbid obesity due to excess calories ([place]) 08/11/2017  . Chronic respiratory failure with hypoxia ([place]) 08/10/2017  . GERD (gastroesophageal reflux disease) 07/29/2017  . Rash and nonspecific skin eruption 07/27/2017  . Contact dermatitis 07/27/2017  . ESRD on hemodialysis ([place])    . Sepsis ([place]) 07/12/2017  . T1DM (type 1 diabetes mellitus) ([place]) 07/12/2017  . Hypotension (arterial) 10/15/2016  . Heme positive stool   . IDDM with complications 09/62/8366  . Oxygen dependent 03/24/2016  . Visit for preventive health examination 03/24/2016  . Chronic diastolic heart failure ([place]) 02/25/2016  . Tracheobronchomalacia 02/04/2016  . Anemia of chronic disease   . Iron deficiency anemia   . Gastrointestinal hemorrhage with melena 12/21/2015  . Pulmonary edema 03/09/2015  . Chronic pain 03/09/2015  . Hyperkalemia 03/09/2015  . HLD (hyperlipidemia) 11/27/2014  . ESRD on dialysis ([place]) 09/30/2014  . Coronary artery disease involving native coronary artery of native heart with unstable angina pectoris ([place]) 03/20/2014  . Allergic reaction to dye 03/20/2014  . NSTEMI (non-ST elevated myocardial infarction) ([place]) 03/19/2014  . HTN (hypertension) 02/18/2014  . ESRD (end stage renal disease) ([place]) 08/27/2011  . Abdominal pain 08/27/2011  . Pericardial effusion 08/27/2011  . History of renal transplant 07/22/2011  . S/P BKA (below knee amputation) ([place]) 07/22/2011  . OSA (obstructive sleep apnea) 07/22/2011  . DOE (dyspnea on exertion) 07/22/2011    Past Surgical History:  Procedure Laterality Date  . A/V FISTULAGRAM Left 07/26/2016   Procedure: A/V Fistulagram;  Surgeon: Waynetta Sandy, MD;  Location: [place] CV LAB;  Service: Cardiovascular;  Laterality: Left;  . A/V FISTULAGRAM N/A 07/11/2017   Procedure: A/V FISTULAGRAM - Left AV;  Surgeon: Waynetta Sandy, MD;  Location: [place] CV LAB;  Service: Cardiovascular;  Laterality: N/A;  . AMPUTATION OF REPLICATED TOES Right   . ANGIOPLASTY  04/09/2014   RCA   . APPENDECTOMY  ~ 2006  . ARTERIOVENOUS GRAFT PLACEMENT Left 1993?   forearm  . ARTERIOVENOUS GRAFT PLACEMENT Right 1993?   leg  . ARTERIOVENOUS GRAFT PLACEMENT Left    Thigh  . Arteriovenous Graft Removed Left    Thigh  . AV  FISTULA PLACEMENT  07/29/2011   Procedure: ARTERIOVENOUS (AV) FISTULA CREATION;  Surgeon: Mal Misty, MD;  Location: [place];  Service: Vascular;  Laterality: Right;  Brachial cephalic  . AV FISTULA PLACEMENT Left 01/20/2015   Procedure: LIGATION OF RIGHT ARTERIOVENOUS FISTULA WITH EXCISION OF ANEURYSM;  Surgeon: Angelia Mould, MD;  Location: [facility];  Service: Vascular;  Laterality: Left;  . AV FISTULA PLACEMENT Left 04/30/2015   Procedure: CREATION OF LEFT ARM BRACHIO-CEPHALIC ARTERIOVENOUS (AV) FISTULA ;  Surgeon: Angelia Mould, MD;  Location: [place];  Service: Vascular;  Laterality: Left;  . AV Graft PLACEMENT Left 1993?   "attempted one in my wrist; didn't take"  . BASCILIC VEIN TRANSPOSITION Left 11/01/2018   Procedure: FIRST STAGE BASILIC VEIN  TRANSPOSITION  LEFT ARM;  Surgeon: Serafina Mitchell, MD;  Location: [facility];  Service: Vascular;  Laterality: Left;  . BASCILIC VEIN TRANSPOSITION Left 12/27/2018   Procedure: SECOND STAGE BASILIC VEIN TRANSPOSITION  LEFT ARM;  Surgeon: Serafina Mitchell, MD;  Location: [place];  Service: Vascular;  Laterality: Left;  . BELOW KNEE LEG AMPUTATION Left 2010  . CARDIAC CATHETERIZATION  03/19/2014  . CARDIAC CATHETERIZATION  03/19/2014   Procedure: CORONARY BALLOON ANGIOPLASTY;  Surgeon: Burnell Blanks, MD;  Location: [facility] CATH LAB;  Service: Cardiovascular;;  . CARDIAC CATHETERIZATION N/A 01/01/2016   Procedure: Left Heart Cath and Coronary Angiography;  Surgeon: Troy Sine, MD;  Location: [place] [facility] CV LAB;  Service: Cardiovascular;  Laterality: N/A;  . CARDIAC CATHETERIZATION N/A 01/01/2016   Procedure: Coronary Stent Intervention;  Surgeon: Troy Sine, MD;  Location: [place] CV LAB;  Service: Cardiovascular;  Laterality: N/A;  . CARPAL TUNNEL RELEASE Bilateral after 2005  . CATARACT EXTRACTION W/ INTRAOCULAR LENS  IMPLANT, BILATERAL Bilateral 1990's  . COLONOSCOPY  08/29/2011   Procedure: COLONOSCOPY;  Surgeon: Jerene Bears, MD;   Location: [facility];  Service: Gastroenterology;  Laterality: N/A;  . COLONOSCOPY WITH PROPOFOL N/A 04/26/2016   Procedure: COLONOSCOPY WITH PROPOFOL;  Surgeon: Jerene Bears, MD;  Location: [facility] ENDOSCOPY;  Service: Gastroenterology;  Laterality: N/A;  . CORONARY ANGIOPLASTY WITH STENT PLACEMENT  04/09/2014       ptca/des mid lad   . ESOPHAGOGASTRODUODENOSCOPY N/A 12/25/2015   Procedure: ESOPHAGOGASTRODUODENOSCOPY (EGD);  Surgeon: Mauri Pole, MD;  Location: [facility] ENDOSCOPY;  Service: Endoscopy;  Laterality: N/A;  bedside  . ESOPHAGOGASTRODUODENOSCOPY (EGD) WITH PROPOFOL N/A 04/26/2016   Procedure: ESOPHAGOGASTRODUODENOSCOPY (EGD) WITH PROPOFOL;  Surgeon: Jerene Bears, MD;  Location: [facility] ENDOSCOPY;  Service: Gastroenterology;  Laterality: N/A;  . EYE SURGERY Bilateral    cataract  . FEMORAL ARTERY REPAIR  04/09/2014   ANGIOSEAL  . INSERTION OF DIALYSIS CATHETER  08/01/2011   Procedure: INSERTION OF DIALYSIS CATHETER;  Surgeon: Rosetta Posner, MD;  Location: [facility];  Service: Vascular;  Laterality: Right;  insertion of dialysis catheter on right internal jugular vein  . INSERTION OF DIALYSIS CATHETER Right 01/20/2015   Procedure: INSERTION OF DIALYSIS CATHETER - RIGHT INTERNAL JUGULAR ;  Surgeon: Angelia Mould, MD;  Location: [place];  Service: Vascular;  Laterality: Right;  . INSERTION OF DIALYSIS CATHETER Right 08/02/2018   Procedure: Insertion Of Dialysis Catheter;  Surgeon: Angelia Mould, MD;  Location: [facility] OR;  Service: Vascular;  Laterality: Right;  . KIDNEY TRANSPLANT  04/25/1993  . LEFT HEART CATHETERIZATION WITH CORONARY ANGIOGRAM N/A 03/19/2014   Procedure: LEFT HEART CATHETERIZATION WITH CORONARY ANGIOGRAM;  Surgeon: Burnell Blanks, MD;  Location: [facility] CATH LAB;  Service: Cardiovascular;  Laterality: N/A;  . LIGATION OF ARTERIOVENOUS  FISTULA Left 11/01/2018   Procedure: Ligation Of  Brachio Cephalic Arteriovenous  Fistula;  Surgeon: Serafina Mitchell, MD;  Location: [facility] OR;   Service: Vascular;  Laterality: Left;  . LIGATION OF ARTERIOVENOUS  FISTULA Left 02/19/2019   Procedure: LIGATION OF ARTERIOVENOUS  FISTULA LEFT ARM;  Surgeon: Angelia Mould, MD;  Location: [place];  Service: Vascular;  Laterality: Left;  . LYMPH NODE BIOPSY  08/10/2011   Procedure: LYMPH NODE BIOPSY;  Surgeon: Merrie Roof, MD;  Location: [place];  Service: General;  Laterality: Left;  left groin lymph biopsy  . NEUROPLASTY / TRANSPOSITION ULNAR NERVE AT ELBOW Left after 2005  .  PATCH ANGIOPLASTY Left 08/09/2016   Procedure: LEFT  ARTERIOVENOUS FISTULA PATCH ANGIOPLASTY;  Surgeon: Waynetta Sandy, MD;  Location: [place];  Service: Vascular;  Laterality: Left;  . PERCUTANEOUS CORONARY ROTOBLATOR INTERVENTION (PCI-R) N/A 04/09/2014   Procedure: PERCUTANEOUS CORONARY ROTOBLATOR INTERVENTION (PCI-R);  Surgeon: Burnell Blanks, MD;  Location: [facility] CATH LAB;  Service: Cardiovascular;  Laterality: N/A;  . PERIPHERAL VASCULAR BALLOON ANGIOPLASTY  07/11/2017   Procedure: PERIPHERAL VASCULAR BALLOON ANGIOPLASTY;  Surgeon: Waynetta Sandy, MD;  Location: [place] CV LAB;  Service: Cardiovascular;;  left arm AV fistula  . PERIPHERAL VASCULAR CATHETERIZATION N/A 12/25/2014   Procedure: Upper Extremity Venography;  Surgeon: Conrad [place] Logan, MD;  Location: [place] CV LAB;  Service: Cardiovascular;  Laterality: N/A;  . PERIPHERAL VASCULAR CATHETERIZATION Left 02/24/2016   Procedure: Fistulagram;  Surgeon: Waynetta Sandy, MD;  Location: [place] CV LAB;  Service: Cardiovascular;  Laterality: Left;  . PERIPHERAL VASCULAR CATHETERIZATION Left 02/24/2016   Procedure: Peripheral Vascular Intervention;  Surgeon: Waynetta Sandy, MD;  Location: [place] [facility] LAB;  Service: Cardiovascular;  Laterality: Left;  AV FISTULA  . PERITONEAL CATHETER INSERTION    . PERITONEAL CATHETER REMOVAL    . REVISON OF ARTERIOVENOUS FISTULA Right 1/75/1025   Procedure: PLICATION /  REVISION OF ARTERIOVENOUS FISTULA;  Surgeon: Angelia Mould, MD;  Location: [place];  Service: Vascular;  Laterality: Right;  . REVISON OF ARTERIOVENOUS FISTULA Left 09/01/2015   Procedure: SUPERFICIALIZATION OF LEFT ARM BRACHIOCEPHALIC ARTERIOVENOUS FISTULA;  Surgeon: Angelia Mould, MD;  Location: [place];  Service: Vascular;  Laterality: Left;  . REVISON OF ARTERIOVENOUS FISTULA Left 08/09/2016   Procedure: REVISION OF ARTERIAL ANASTOMOSIS OF ARTERIOVENOUS FISTULA;  Surgeon: Waynetta Sandy, MD;  Location: [place];  Service: Vascular;  Laterality: Left;  . SHOULDER ARTHROSCOPY Left ~ 2006   frozen  . THROMBECTOMY W/ EMBOLECTOMY Left 08/02/2018   Procedure: THROMBECTOMY ARTERIOVENOUS FISTULA;  Surgeon: Angelia Mould, MD;  Location: [place];  Service: Vascular;  Laterality: Left;  . TOE AMPUTATION Bilateral after 2005  . UMBILICAL HERNIA REPAIR  1990's  . UPPER EXTREMITY VENOGRAPHY Bilateral 10/16/2018   Procedure: UPPER EXTREMITY VENOGRAPHY;  Surgeon: Serafina Mitchell, MD;  Location: [place] CV LAB;  Service: Cardiovascular;  Laterality: Bilateral;  . VENOGRAM Right 07/28/2011   Procedure: VENOGRAM;  Surgeon: Conrad Rembert, MD;  Location: [facility] CATH LAB;  Service: Cardiovascular;  Laterality: Right;  . VITRECTOMY Bilateral        Family History  Problem Relation Age of Onset  . Colon polyps Father   . Diabetes Father   . Hypertension Father   . Hypertension Mother   . Diabetes Mother   . Hyperlipidemia Mother   . Breast cancer Maternal Aunt   . Bone cancer Maternal Aunt   . Liver cancer Maternal Aunt   . Malignant hyperthermia Neg Hx   . Heart attack Neg Hx   . Stroke Neg Hx   . Colon cancer Neg Hx   . Stomach cancer Neg Hx   . Pancreatic cancer Neg Hx   . Esophageal cancer Neg Hx     Social History   Tobacco Use  . Smoking status: Never Smoker  . Smokeless tobacco: Never Used  Vaping Use  . Vaping Use: Never used  Substance Use Topics  . Alcohol use:  Yes    Comment: rare  . Drug use: No    Home Medications Prior to Admission medications   Medication Sig  Start Date End Date Taking? Authorizing Provider  acetaminophen (TYLENOL) 500 MG tablet Take 1,000 mg by mouth every 6 (six) hours as needed (pain/headaches.).     [provider]  allopurinol (ZYLOPRIM) 100 MG tablet Take 1 tablet (100 mg total) by mouth every other day. 05/21/20   Doristine Mango L, DO  aspirin EC 81 MG tablet Take 81 mg by mouth at bedtime.    [provider]  atorvastatin (LIPITOR) 20 MG tablet Take 20 mg by mouth daily. 02/12/19   [provider]  B Complex-C-Folic Acid (DIALYVITE 528) 0.8 MG TABS Take 1 tablet by mouth daily.    [provider]  calcium acetate (PHOSLO) 667 MG capsule Take 2,001 mg by mouth 3 (three) times daily with meals. 09/19/12   [provider]  cetirizine (ZYRTEC) 10 MG tablet Take 10 mg by mouth daily.    [provider]  famotidine (PEPCID) 20 MG tablet Take 20 mg by mouth 2 (two) times daily.    [provider]  fluticasone (FLONASE) 50 MCG/ACT nasal spray Place 2 sprays into both nostrils daily as needed for allergies.     [provider]  gabapentin (NEURONTIN) 300 MG capsule Take 1 capsule (300 mg total) by mouth 2 (two) times daily as needed. 05/21/20   Anderson, Chelsey L, DO  GLUCAGON EMERGENCY 1 MG injection Inject 1 mg into the muscle daily as needed (low blood sugar).  Patient not taking: No sig reported 01/19/12   [provider]  Glucose-Cholecalciferol (RELION GLUCOSE SHOT) LIQD Take 1 Dose by mouth daily as needed (low blood sugar). Patient not taking: No sig reported    [provider]  HYDROmorphone (DILAUDID) 2 MG tablet Take 1 tablet (2 mg total) by mouth daily as needed for severe pain. 05/21/20   Anderson, Chelsey L, DO  insulin lispro (HUMALOG) 100 UNIT/ML injection Inject 75-120 Units into the skin continuous. Per insulin pump     [provider]  levothyroxine (SYNTHROID, LEVOTHROID) 112 MCG tablet Take 112 mcg by mouth at bedtime.     [provider]  loperamide (IMODIUM A-D) 2 MG tablet Take 2-4 mg by mouth 4 (four) times daily as needed for diarrhea or loose stools.    [provider]  Melatonin 5 MG TABS Take 5 mg by mouth at bedtime.    [provider]  midodrine (PROAMATINE) 10 MG tablet Take 1 tablet (10 mg total) by mouth 3 (three) times daily. Takes 1 tab in mornings of dialysis and takes 2 tabs 30 mins before starting dialysis (m,w,f) 04/14/16   Elgergawy, Silver Huguenin, MD  nitroGLYCERIN (NITROSTAT) 0.4 MG SL tablet Place 0.4 mg under the tongue every 5 (five) minutes as needed for chest pain. Patient not taking: No sig reported    [provider]  Polyethyl Glycol-Propyl Glycol (SYSTANE ULTRA PF OP) Place 1 drop into both eyes 4 (four) times daily as needed (dry eyes).    [provider]  Polyethyl Glycol-Propyl Glycol 0.4-0.3 % SOLN Place 1 drop into both eyes 2 (two) times daily.    [provider]  polyethylene glycol (MIRALAX / GLYCOLAX) 17 g packet Take 17 g by mouth daily as needed. 05/21/20   Anderson, Chelsey L, DO  prasugrel (EFFIENT) 10 MG TABS tablet Take 1 tablet (10 mg total) by mouth daily. Please keep upcoming appt in January 2022 before anymore refills. Thank you 03/02/20   Burnell Blanks, MD  promethazine (PHENERGAN) 25 MG tablet  Take 25 mg by mouth every 6 (six) hours as needed for nausea or vomiting.    [provider]  rOPINIRole (REQUIP) 2 MG tablet Take 2 mg by mouth every evening.    [provider]  sevelamer carbonate (RENVELA) 800 MG tablet Take 1,600 mg by mouth 3 (three) times daily with meals.    [provider]  testosterone cypionate (DEPOTESTOSTERONE CYPIONATE) 200 MG/ML injection Inject 200 mg into the muscle every 14 (fourteen) days.  09/10/16   [provider]  triamcinolone  (KENALOG) 0.1 % Apply 1 application topically 2 (two) times daily. 05/24/20   Volney American, PA-C    Allergies    Iodinated diagnostic agents, Prednisone, Adhesive [tape], Amoxicillin, Clindamycin/lincomycin, Enalapril, Mobic [meloxicam], Morphine, Brilinta [ticagrelor], Ciprofloxacin, Clopidogrel, Codeine, Doxycycline, Hydrocodone, Levofloxacin, Oxycodone, and Rocephin [ceftriaxone]  Review of Systems   Review of Systems  Ten systems reviewed and are negative for acute change, except as noted in the HPI.    Physical Exam Updated Vital Signs BP (!) 123/56   Pulse 92   Temp 97.8 F (36.6 C) (Oral)   Resp 15   Ht _0  (1.727 m)   Wt 122.5 kg   SpO2 98%   BMI 41.05 kg/m   Physical Exam Vitals and nursing note reviewed.  Constitutional:      General: He is not in acute distress.    Appearance: He is well-developed and well-nourished. He is not diaphoretic.     Comments: Chronically ill appearing, obese.  HENT:     Head: Normocephalic and atraumatic.  Eyes:     General: No scleral icterus.    Extraocular Movements: EOM normal.     Conjunctiva/sclera: Conjunctivae normal.  Cardiovascular:     Rate and Rhythm: Normal rate and regular rhythm.     Pulses: Normal pulses.  Pulmonary:     Effort: Pulmonary effort is normal. No respiratory distress.     Comments: Appears dyspneic without tachypnea. SpO2 96% on chronic 2L via [place]. Musculoskeletal:        General: Normal range of motion.     Cervical back: Normal range of motion.     Comments: Left BKA  Skin:    General: Skin is warm and dry.     Coloration: Skin is not pale.     Findings: No erythema or rash.  Neurological:     Mental Status: He is alert and oriented to person, place, and time.     Coordination: Coordination normal.     Comments: GCS 15. Speech is clear, goal oriented. Moves extremities without ataxia. Normal coordination. No facial asymmetry or twitching. No right ankle clonus. Triceps, biceps reflexes  2+ b/l.  Psychiatric:        Mood and Affect: Mood and affect normal.        Behavior: Behavior normal.     ED Results / Procedures / Treatments   Labs (all labs ordered are listed, but only abnormal results are displayed) Labs Reviewed  COMPREHENSIVE METABOLIC PANEL - Abnormal; Notable for the following components:      Result Value   Sodium 134 (*)    Chloride 94 (*)    Glucose, Bld 196 (*)    BUN 50 (*)    Creatinine, Ser 12.39 (*)    Calcium 8.2 (*)    Albumin 2.7 (*)    ALT 116 (*)    Alkaline Phosphatase 752 (*)    Total Bilirubin 1.7 (*)    GFR, Estimated 4 (*)  All other components within normal limits  CBC WITH DIFFERENTIAL/PLATELET - Abnormal; Notable for the following components:   RBC 2.70 (*)    Hemoglobin 8.5 (*)    HCT 28.6 (*)    MCV 105.9 (*)    MCHC 29.7 (*)    RDW 16.1 (*)    nRBC 0.3 (*)    Eosinophils Absolute 0.9 (*)    Abs Immature Granulocytes 0.08 (*)    All other components within normal limits    EKG None  Radiology No results found.  Procedures Procedures   Medications Ordered in ED Medications  metoCLOPramide (REGLAN) injection 10 mg (10 mg Intramuscular Given 05/25/20 0933)    ED Course  I have reviewed the triage vital signs and the nursing notes.  Pertinent labs & imaging results that were available during my care of the patient were reviewed by me and considered in my medical decision making (see chart for details).    MDM Rules/Calculators/A&P                          55 year old male presents to the emergency department complaining of several days of jerking movements.  He actually reports that the symptoms were present when he was most recently admitted, but did not mention the symptoms to his providers because he did not think much of them at the time. Questions whether his symptoms may be related to gabapentin toxicity as he has had limited dialysis over the past week.  Is supposed to be dialyzed this  morning.  Patient noted to be moving all extremities spontaneously and without difficulty.  He references symptoms being present when he tries to do things like pick up and use his phone.  However, he has no evidence of involuntary jerking during my entire bedside encounter with him.  No resting tremor, facial twitching.  He has normal upper extremity reflexes bilaterally and no evidence of lower extremity clonus.  Endorsing some chronic, gradually worsening b/l blurry vision, but no acute vision changes over the past week.  His speech is clear, goal oriented.  VSS while in the ED.  No tachycardia.  On chart review, patient with BP elevation in comparison to his recent admission, though this is c/w need for dialysis as his his creatinine.  No significant electrolyte derangements; specifically no hypokalemia.  He has no hypocalcemia.  Chronic anemia is stable.  Discussed with patient that, should his symptoms be related to any of his daily medications, symptoms would be expected to resolve following a dialysis session. He is supposed to be dialyzed today. Encouraged him to contact his center to schedule dialysis for later today.  I do not see indication for further emergent or inpatient work up at this time.  Return precautions discussed and provided.  Patient discharged in stable condition with no unaddressed concerns.   Final Clinical Impression(s) / ED Diagnoses Final diagnoses:  Anemia in ESRD (end-stage renal disease) ([place])  End-stage renal disease needing dialysis [facility])    Rx / DC Orders ED Discharge Orders    None       Antonietta Breach, PA-C 05/25/20 1155    Truddie Hidden, MD 05/25/20 1329

## 2020-05-25 NOTE — ED Notes (Signed)
Pt d/c home per MD order. Discharge summary reviewed with pt, pt verbalizes understanding. Off unit via WC. No s/s of acute distress noted. Reports wife is discharge ride home.

## 2020-05-26 ENCOUNTER — Ambulatory Visit: Payer: Medicare Other | Admitting: Internal Medicine

## 2020-05-26 DIAGNOSIS — E1069 Type 1 diabetes mellitus with other specified complication: Secondary | ICD-10-CM | POA: Diagnosis not present

## 2020-05-26 DIAGNOSIS — I872 Venous insufficiency (chronic) (peripheral): Secondary | ICD-10-CM | POA: Diagnosis not present

## 2020-05-26 DIAGNOSIS — E10649 Type 1 diabetes mellitus with hypoglycemia without coma: Secondary | ICD-10-CM | POA: Diagnosis not present

## 2020-05-26 DIAGNOSIS — E1051 Type 1 diabetes mellitus with diabetic peripheral angiopathy without gangrene: Secondary | ICD-10-CM | POA: Diagnosis not present

## 2020-05-26 DIAGNOSIS — L98499 Non-pressure chronic ulcer of skin of other sites with unspecified severity: Secondary | ICD-10-CM | POA: Diagnosis not present

## 2020-05-26 DIAGNOSIS — E103593 Type 1 diabetes mellitus with proliferative diabetic retinopathy without macular edema, bilateral: Secondary | ICD-10-CM | POA: Diagnosis not present

## 2020-05-26 DIAGNOSIS — E1022 Type 1 diabetes mellitus with diabetic chronic kidney disease: Secondary | ICD-10-CM | POA: Diagnosis not present

## 2020-05-26 DIAGNOSIS — Z992 Dependence on renal dialysis: Secondary | ICD-10-CM | POA: Diagnosis not present

## 2020-05-26 DIAGNOSIS — E1040 Type 1 diabetes mellitus with diabetic neuropathy, unspecified: Secondary | ICD-10-CM | POA: Diagnosis not present

## 2020-05-26 DIAGNOSIS — E1059 Type 1 diabetes mellitus with other circulatory complications: Secondary | ICD-10-CM | POA: Diagnosis not present

## 2020-05-26 DIAGNOSIS — S98131A Complete traumatic amputation of one right lesser toe, initial encounter: Secondary | ICD-10-CM | POA: Diagnosis not present

## 2020-05-26 DIAGNOSIS — Z89612 Acquired absence of left leg above knee: Secondary | ICD-10-CM | POA: Diagnosis not present

## 2020-05-26 DIAGNOSIS — S98111A Complete traumatic amputation of right great toe, initial encounter: Secondary | ICD-10-CM | POA: Diagnosis not present

## 2020-05-26 DIAGNOSIS — E1065 Type 1 diabetes mellitus with hyperglycemia: Secondary | ICD-10-CM | POA: Diagnosis not present

## 2020-05-26 NOTE — Progress Notes (Deleted)
$'@Patient'g$  ID: Dale [name], male    DOB: 25-Aug-1965     MRN: [id number]    HPI: 84 yowm MO/ never smoker  with known DM and ESRD on Dialysis and Grade I diastolic dysfunction by Echo 11/01/16 . Seen for pulmonary consult during hospitalization in 12/2015 for acute resp failure and  post op stridor found to have tracheomalacia on CT neck.   TESTS  12/2015 >CT neck , tracheomalacia , highly atelectatic appearance of trachea.  01/29/16 HRCT Chest >very mild patchy GG attenuation, favored for mild interstitial edema. Severe tracheobronchomalacia.   Sleep study 03/02/2016 >Severe OSA (This study showed severe obstructive sleep apnea with an AH of 76 and SaO2 low of 56%. -  rec  trial of BiPAP therapy on 23/19 cm H2O.  06/22/2017 NP Follow up : OSA   he returns for a follow-up.  Patient was seen previously in 2017 for suspected sleep apnea.  He was set up for a sleep study done on November eighth 2017.  This showed severe sleep apnea with an AHI at 76, SaO2 low at 56%.  He was recommended to begin on BiPAP.  Patient says he did get his BiPAP machine but says he could not tolerate this.  He has severe night terrors claustrophobia.  He ended up returning this back to the DME company.  He did not follow-up since 2017.  Patient is on dialysis.  He is in a wheelchair with previous left leg amputation with prosthesis.  Says it is hard for him to get to appointments due to chronic pain and is on daily narcotics.[name] Kitchen rec Set up for CPAP titration study. > changed to split night study and not done as of 08/10/2017  Follow up in 2 months with Dr. Halford Chessman  In [facility]     08/10/2017 acute extended ov/Dale [name] re: sob/ desats Chief Complaint  Patient presents with  . Acute Visit    Dropping O2 for 3 weeks. O2 has been in the lower 70s-upper 80s. Productive cough with clear mucus. Denies any fever.   sleeping propped up 30-40 degrees and 3lpm just at hs  One week prior to OV  Noted worse sats and need for daytime 02  when bending over in w/c (he is not ambulatory) assoc with hacking cough but minimal mucoid sputum production rec Pantoprazole (protonix) 40 mg  Take 30- 60 min before your first and last meals of the day  GERD diet Please see patient coordinator before you leave today  to schedule sleep study and ambulatory 02 titration to see if eligible for POC  Need to pull off as much fluid as you can at dialysis   04/16/20 NP ov: 04/16/2020 Patient presents today for overdue follow-up/qualify for POC. He has not been seen in our office in over two years. He was recently admitted to [facility] from 04/06/20 until 04/11/20 for multifocal pneumonia . He was treated with IV Vancomycin, cefepime and azithromycin for 4 days. He was discharged home on Omnicef '300mg'$  twice daily x 7 days and oral azithromycin daily x 5 days. BC negative. Patient states that his breathing is better and that he is not "hacking" as much. He does report GERD symptoms, recently started taking omeprazole. He ordered a flutter valve on [place] which will be delivered soon. He is not currently taking an expectorant. He wears 2L oxygen continuously. He is interested in purchasing POC from [place]. Rec: CTa 05/20/20: 1. No evidence of significant pulmonary embolus. 2.  Small bilateral pleural effusions. 3. Patchy airspace disease in the lungs could represent multifocal pneumonia or edema. 4. Mediastinal lymphadenopathy, unchanged since prior study from 2017, likely reactive. 5. Focal narrowing of the SVC above the level of the left brachiocephalic. Multiple venous collaterals in the upper chest. 6. Severe atrophy of the right kidney. 7. Diffuse skeletal sclerosis likely representing renal osteodystrophy. 8. Aortic atherosclerosis.   05/26/2020  f/u ov/Dale [name] re:  No chief complaint on file.    Dyspnea:  *** Cough: *** Sleeping: *** SABA use: *** 02: ***   No obvious day to day or daytime variability or assoc excess/ purulent  sputum or mucus plugs or hemoptysis or cp or chest tightness, subjective wheeze or overt sinus or hb symptoms.   *** without nocturnal  or early am exacerbation  of respiratory  c/o's or need for noct saba. Also denies any obvious fluctuation of symptoms with weather or environmental changes or other aggravating or alleviating factors except as outlined above   No unusual exposure hx or h/o childhood pna/ asthma or knowledge of premature birth.  Current Allergies, Complete Past Medical History, Past Surgical History, Family History, and Social History were reviewed in [employer] record.  ROS  The following are not active complaints unless bolded Hoarseness, sore throat, dysphagia, dental problems, itching, sneezing,  nasal congestion or discharge of excess mucus or purulent secretions, ear ache,   fever, chills, sweats, unintended wt loss or wt gain, classically pleuritic or exertional cp,  orthopnea pnd or arm/hand swelling  or leg swelling, presyncope, palpitations, abdominal pain, anorexia, nausea, vomiting, diarrhea  or change in bowel habits or change in bladder habits, change in stools or change in urine, dysuria, hematuria,  rash, arthralgias, visual complaints, headache, numbness, weakness or ataxia or problems with walking or coordination,  change in mood or  memory.        No outpatient medications have been marked as taking for the 05/26/20 encounter (Appointment) with Tanda Rockers, MD.                       Physical Exam      05/26/2020        ***  07/27/17 293 lb 9.6 oz (133.2 kg)  07/18/17 285 lb 0.9 oz (129.3 kg)  07/11/17 283 lb (128.4 kg)      Vital signs reviewed  05/26/2020  - Note at rest 02 sats  ***% on ***   General appearance:    ***            Assessment & Plan:

## 2020-05-27 DIAGNOSIS — R110 Nausea: Secondary | ICD-10-CM | POA: Diagnosis not present

## 2020-05-27 DIAGNOSIS — R1110 Vomiting, unspecified: Secondary | ICD-10-CM | POA: Diagnosis not present

## 2020-05-27 DIAGNOSIS — I120 Hypertensive chronic kidney disease with stage 5 chronic kidney disease or end stage renal disease: Secondary | ICD-10-CM | POA: Diagnosis not present

## 2020-05-27 DIAGNOSIS — Z992 Dependence on renal dialysis: Secondary | ICD-10-CM | POA: Diagnosis not present

## 2020-05-27 DIAGNOSIS — J9611 Chronic respiratory failure with hypoxia: Secondary | ICD-10-CM | POA: Diagnosis not present

## 2020-05-27 DIAGNOSIS — I5032 Chronic diastolic (congestive) heart failure: Secondary | ICD-10-CM | POA: Diagnosis not present

## 2020-05-27 DIAGNOSIS — J810 Acute pulmonary edema: Secondary | ICD-10-CM | POA: Diagnosis not present

## 2020-05-27 DIAGNOSIS — I132 Hypertensive heart and chronic kidney disease with heart failure and with stage 5 chronic kidney disease, or end stage renal disease: Secondary | ICD-10-CM | POA: Diagnosis not present

## 2020-05-27 DIAGNOSIS — L03115 Cellulitis of right lower limb: Secondary | ICD-10-CM | POA: Diagnosis not present

## 2020-05-27 DIAGNOSIS — E039 Hypothyroidism, unspecified: Secondary | ICD-10-CM | POA: Diagnosis not present

## 2020-05-27 DIAGNOSIS — N2581 Secondary hyperparathyroidism of renal origin: Secondary | ICD-10-CM | POA: Diagnosis not present

## 2020-05-27 DIAGNOSIS — U071 COVID-19: Secondary | ICD-10-CM | POA: Diagnosis not present

## 2020-05-27 DIAGNOSIS — J9621 Acute and chronic respiratory failure with hypoxia: Secondary | ICD-10-CM | POA: Diagnosis not present

## 2020-05-27 DIAGNOSIS — J811 Chronic pulmonary edema: Secondary | ICD-10-CM | POA: Diagnosis not present

## 2020-05-27 DIAGNOSIS — R0602 Shortness of breath: Secondary | ICD-10-CM | POA: Diagnosis not present

## 2020-05-27 DIAGNOSIS — Z20822 Contact with and (suspected) exposure to covid-19: Secondary | ICD-10-CM | POA: Diagnosis not present

## 2020-05-27 DIAGNOSIS — Z9981 Dependence on supplemental oxygen: Secondary | ICD-10-CM | POA: Diagnosis not present

## 2020-05-27 DIAGNOSIS — D689 Coagulation defect, unspecified: Secondary | ICD-10-CM | POA: Diagnosis not present

## 2020-05-27 DIAGNOSIS — D631 Anemia in chronic kidney disease: Secondary | ICD-10-CM | POA: Diagnosis not present

## 2020-05-27 DIAGNOSIS — I081 Rheumatic disorders of both mitral and tricuspid valves: Secondary | ICD-10-CM | POA: Diagnosis not present

## 2020-05-27 DIAGNOSIS — Z9641 Presence of insulin pump (external) (internal): Secondary | ICD-10-CM | POA: Diagnosis not present

## 2020-05-27 DIAGNOSIS — J449 Chronic obstructive pulmonary disease, unspecified: Secondary | ICD-10-CM | POA: Diagnosis not present

## 2020-05-27 DIAGNOSIS — D509 Iron deficiency anemia, unspecified: Secondary | ICD-10-CM | POA: Diagnosis not present

## 2020-05-27 DIAGNOSIS — R7989 Other specified abnormal findings of blood chemistry: Secondary | ICD-10-CM | POA: Diagnosis not present

## 2020-05-27 DIAGNOSIS — N186 End stage renal disease: Secondary | ICD-10-CM | POA: Diagnosis not present

## 2020-05-27 DIAGNOSIS — T8612 Kidney transplant failure: Secondary | ICD-10-CM | POA: Diagnosis not present

## 2020-05-27 DIAGNOSIS — T82818A Embolism of vascular prosthetic devices, implants and grafts, initial encounter: Secondary | ICD-10-CM | POA: Diagnosis not present

## 2020-05-27 DIAGNOSIS — E1022 Type 1 diabetes mellitus with diabetic chronic kidney disease: Secondary | ICD-10-CM | POA: Diagnosis not present

## 2020-05-27 DIAGNOSIS — J9622 Acute and chronic respiratory failure with hypercapnia: Secondary | ICD-10-CM | POA: Diagnosis not present

## 2020-05-27 DIAGNOSIS — E108 Type 1 diabetes mellitus with unspecified complications: Secondary | ICD-10-CM | POA: Diagnosis not present

## 2020-05-28 DIAGNOSIS — J9621 Acute and chronic respiratory failure with hypoxia: Secondary | ICD-10-CM | POA: Diagnosis not present

## 2020-05-28 DIAGNOSIS — E1022 Type 1 diabetes mellitus with diabetic chronic kidney disease: Secondary | ICD-10-CM | POA: Diagnosis not present

## 2020-05-28 DIAGNOSIS — R0602 Shortness of breath: Secondary | ICD-10-CM | POA: Diagnosis not present

## 2020-05-28 DIAGNOSIS — J9622 Acute and chronic respiratory failure with hypercapnia: Secondary | ICD-10-CM | POA: Diagnosis not present

## 2020-05-28 DIAGNOSIS — N186 End stage renal disease: Secondary | ICD-10-CM | POA: Diagnosis not present

## 2020-05-29 DIAGNOSIS — J9622 Acute and chronic respiratory failure with hypercapnia: Secondary | ICD-10-CM | POA: Diagnosis not present

## 2020-05-29 DIAGNOSIS — N186 End stage renal disease: Secondary | ICD-10-CM | POA: Diagnosis not present

## 2020-05-29 DIAGNOSIS — L03115 Cellulitis of right lower limb: Secondary | ICD-10-CM | POA: Diagnosis not present

## 2020-05-29 DIAGNOSIS — J9621 Acute and chronic respiratory failure with hypoxia: Secondary | ICD-10-CM | POA: Diagnosis not present

## 2020-05-30 DIAGNOSIS — L03115 Cellulitis of right lower limb: Secondary | ICD-10-CM | POA: Diagnosis not present

## 2020-05-30 DIAGNOSIS — E1065 Type 1 diabetes mellitus with hyperglycemia: Secondary | ICD-10-CM | POA: Diagnosis not present

## 2020-05-30 DIAGNOSIS — N186 End stage renal disease: Secondary | ICD-10-CM | POA: Diagnosis not present

## 2020-05-30 DIAGNOSIS — J9621 Acute and chronic respiratory failure with hypoxia: Secondary | ICD-10-CM | POA: Diagnosis not present

## 2020-05-30 DIAGNOSIS — R7989 Other specified abnormal findings of blood chemistry: Secondary | ICD-10-CM | POA: Diagnosis not present

## 2020-05-31 DIAGNOSIS — L03115 Cellulitis of right lower limb: Secondary | ICD-10-CM | POA: Diagnosis not present

## 2020-05-31 DIAGNOSIS — E1065 Type 1 diabetes mellitus with hyperglycemia: Secondary | ICD-10-CM | POA: Diagnosis not present

## 2020-05-31 DIAGNOSIS — J9621 Acute and chronic respiratory failure with hypoxia: Secondary | ICD-10-CM | POA: Diagnosis not present

## 2020-05-31 DIAGNOSIS — N186 End stage renal disease: Secondary | ICD-10-CM | POA: Diagnosis not present

## 2020-06-01 DIAGNOSIS — E1065 Type 1 diabetes mellitus with hyperglycemia: Secondary | ICD-10-CM | POA: Diagnosis not present

## 2020-06-01 DIAGNOSIS — L03115 Cellulitis of right lower limb: Secondary | ICD-10-CM | POA: Diagnosis not present

## 2020-06-01 DIAGNOSIS — N186 End stage renal disease: Secondary | ICD-10-CM | POA: Diagnosis not present

## 2020-06-01 DIAGNOSIS — J9621 Acute and chronic respiratory failure with hypoxia: Secondary | ICD-10-CM | POA: Diagnosis not present

## 2020-06-02 DIAGNOSIS — E1065 Type 1 diabetes mellitus with hyperglycemia: Secondary | ICD-10-CM | POA: Diagnosis not present

## 2020-06-02 DIAGNOSIS — I8392 Asymptomatic varicose veins of left lower extremity: Secondary | ICD-10-CM | POA: Diagnosis not present

## 2020-06-02 DIAGNOSIS — E10649 Type 1 diabetes mellitus with hypoglycemia without coma: Secondary | ICD-10-CM | POA: Diagnosis not present

## 2020-06-02 DIAGNOSIS — L03115 Cellulitis of right lower limb: Secondary | ICD-10-CM | POA: Diagnosis not present

## 2020-06-02 DIAGNOSIS — D631 Anemia in chronic kidney disease: Secondary | ICD-10-CM | POA: Diagnosis not present

## 2020-06-02 DIAGNOSIS — J9611 Chronic respiratory failure with hypoxia: Secondary | ICD-10-CM | POA: Diagnosis not present

## 2020-06-02 DIAGNOSIS — R110 Nausea: Secondary | ICD-10-CM | POA: Diagnosis not present

## 2020-06-02 DIAGNOSIS — L251 Unspecified contact dermatitis due to drugs in contact with skin: Secondary | ICD-10-CM | POA: Diagnosis not present

## 2020-06-02 DIAGNOSIS — T508X5D Adverse effect of diagnostic agents, subsequent encounter: Secondary | ICD-10-CM | POA: Diagnosis not present

## 2020-06-02 DIAGNOSIS — H6983 Other specified disorders of Eustachian tube, bilateral: Secondary | ICD-10-CM | POA: Diagnosis not present

## 2020-06-02 DIAGNOSIS — H6523 Chronic serous otitis media, bilateral: Secondary | ICD-10-CM | POA: Diagnosis not present

## 2020-06-02 DIAGNOSIS — I8391 Asymptomatic varicose veins of right lower extremity: Secondary | ICD-10-CM | POA: Diagnosis not present

## 2020-06-02 DIAGNOSIS — E1051 Type 1 diabetes mellitus with diabetic peripheral angiopathy without gangrene: Secondary | ICD-10-CM | POA: Diagnosis not present

## 2020-06-02 DIAGNOSIS — N186 End stage renal disease: Secondary | ICD-10-CM | POA: Diagnosis not present

## 2020-06-02 DIAGNOSIS — L500 Allergic urticaria: Secondary | ICD-10-CM | POA: Diagnosis not present

## 2020-06-02 DIAGNOSIS — E1022 Type 1 diabetes mellitus with diabetic chronic kidney disease: Secondary | ICD-10-CM | POA: Diagnosis not present

## 2020-06-02 DIAGNOSIS — K219 Gastro-esophageal reflux disease without esophagitis: Secondary | ICD-10-CM | POA: Diagnosis not present

## 2020-06-02 DIAGNOSIS — I2510 Atherosclerotic heart disease of native coronary artery without angina pectoris: Secondary | ICD-10-CM | POA: Diagnosis not present

## 2020-06-02 DIAGNOSIS — I132 Hypertensive heart and chronic kidney disease with heart failure and with stage 5 chronic kidney disease, or end stage renal disease: Secondary | ICD-10-CM | POA: Diagnosis not present

## 2020-06-02 DIAGNOSIS — I5033 Acute on chronic diastolic (congestive) heart failure: Secondary | ICD-10-CM | POA: Diagnosis not present

## 2020-06-03 DIAGNOSIS — T82818A Embolism of vascular prosthetic devices, implants and grafts, initial encounter: Secondary | ICD-10-CM | POA: Diagnosis not present

## 2020-06-03 DIAGNOSIS — Z992 Dependence on renal dialysis: Secondary | ICD-10-CM | POA: Diagnosis not present

## 2020-06-03 DIAGNOSIS — N186 End stage renal disease: Secondary | ICD-10-CM | POA: Diagnosis not present

## 2020-06-03 DIAGNOSIS — N2581 Secondary hyperparathyroidism of renal origin: Secondary | ICD-10-CM | POA: Diagnosis not present

## 2020-06-03 DIAGNOSIS — D509 Iron deficiency anemia, unspecified: Secondary | ICD-10-CM | POA: Diagnosis not present

## 2020-06-03 DIAGNOSIS — D631 Anemia in chronic kidney disease: Secondary | ICD-10-CM | POA: Diagnosis not present

## 2020-06-03 DIAGNOSIS — D689 Coagulation defect, unspecified: Secondary | ICD-10-CM | POA: Diagnosis not present

## 2020-06-05 DIAGNOSIS — N2581 Secondary hyperparathyroidism of renal origin: Secondary | ICD-10-CM | POA: Diagnosis not present

## 2020-06-05 DIAGNOSIS — N186 End stage renal disease: Secondary | ICD-10-CM | POA: Diagnosis not present

## 2020-06-05 DIAGNOSIS — D509 Iron deficiency anemia, unspecified: Secondary | ICD-10-CM | POA: Diagnosis not present

## 2020-06-05 DIAGNOSIS — Z992 Dependence on renal dialysis: Secondary | ICD-10-CM | POA: Diagnosis not present

## 2020-06-05 DIAGNOSIS — D631 Anemia in chronic kidney disease: Secondary | ICD-10-CM | POA: Diagnosis not present

## 2020-06-05 DIAGNOSIS — D689 Coagulation defect, unspecified: Secondary | ICD-10-CM | POA: Diagnosis not present

## 2020-06-05 DIAGNOSIS — T82818A Embolism of vascular prosthetic devices, implants and grafts, initial encounter: Secondary | ICD-10-CM | POA: Diagnosis not present

## 2020-06-07 DIAGNOSIS — J810 Acute pulmonary edema: Secondary | ICD-10-CM | POA: Diagnosis not present

## 2020-06-07 DIAGNOSIS — G4733 Obstructive sleep apnea (adult) (pediatric): Secondary | ICD-10-CM | POA: Diagnosis not present

## 2020-06-07 DIAGNOSIS — M25511 Pain in right shoulder: Secondary | ICD-10-CM | POA: Diagnosis not present

## 2020-06-07 DIAGNOSIS — I25110 Atherosclerotic heart disease of native coronary artery with unstable angina pectoris: Secondary | ICD-10-CM | POA: Diagnosis not present

## 2020-06-08 DIAGNOSIS — T82818A Embolism of vascular prosthetic devices, implants and grafts, initial encounter: Secondary | ICD-10-CM | POA: Diagnosis not present

## 2020-06-08 DIAGNOSIS — Z992 Dependence on renal dialysis: Secondary | ICD-10-CM | POA: Diagnosis not present

## 2020-06-08 DIAGNOSIS — D509 Iron deficiency anemia, unspecified: Secondary | ICD-10-CM | POA: Diagnosis not present

## 2020-06-08 DIAGNOSIS — D689 Coagulation defect, unspecified: Secondary | ICD-10-CM | POA: Diagnosis not present

## 2020-06-08 DIAGNOSIS — N2581 Secondary hyperparathyroidism of renal origin: Secondary | ICD-10-CM | POA: Diagnosis not present

## 2020-06-08 DIAGNOSIS — D631 Anemia in chronic kidney disease: Secondary | ICD-10-CM | POA: Diagnosis not present

## 2020-06-08 DIAGNOSIS — N186 End stage renal disease: Secondary | ICD-10-CM | POA: Diagnosis not present

## 2020-06-10 DIAGNOSIS — N186 End stage renal disease: Secondary | ICD-10-CM | POA: Diagnosis not present

## 2020-06-10 DIAGNOSIS — D631 Anemia in chronic kidney disease: Secondary | ICD-10-CM | POA: Diagnosis not present

## 2020-06-10 DIAGNOSIS — D689 Coagulation defect, unspecified: Secondary | ICD-10-CM | POA: Diagnosis not present

## 2020-06-10 DIAGNOSIS — D509 Iron deficiency anemia, unspecified: Secondary | ICD-10-CM | POA: Diagnosis not present

## 2020-06-10 DIAGNOSIS — Z992 Dependence on renal dialysis: Secondary | ICD-10-CM | POA: Diagnosis not present

## 2020-06-10 DIAGNOSIS — T82818A Embolism of vascular prosthetic devices, implants and grafts, initial encounter: Secondary | ICD-10-CM | POA: Diagnosis not present

## 2020-06-10 DIAGNOSIS — N2581 Secondary hyperparathyroidism of renal origin: Secondary | ICD-10-CM | POA: Diagnosis not present

## 2020-06-11 ENCOUNTER — Inpatient Hospital Stay: Payer: Medicare Other | Admitting: Family Medicine

## 2020-06-12 DIAGNOSIS — D631 Anemia in chronic kidney disease: Secondary | ICD-10-CM | POA: Diagnosis not present

## 2020-06-12 DIAGNOSIS — Z992 Dependence on renal dialysis: Secondary | ICD-10-CM | POA: Diagnosis not present

## 2020-06-12 DIAGNOSIS — N186 End stage renal disease: Secondary | ICD-10-CM | POA: Diagnosis not present

## 2020-06-12 DIAGNOSIS — D689 Coagulation defect, unspecified: Secondary | ICD-10-CM | POA: Diagnosis not present

## 2020-06-12 DIAGNOSIS — D509 Iron deficiency anemia, unspecified: Secondary | ICD-10-CM | POA: Diagnosis not present

## 2020-06-12 DIAGNOSIS — N2581 Secondary hyperparathyroidism of renal origin: Secondary | ICD-10-CM | POA: Diagnosis not present

## 2020-06-12 DIAGNOSIS — T82818A Embolism of vascular prosthetic devices, implants and grafts, initial encounter: Secondary | ICD-10-CM | POA: Diagnosis not present

## 2020-06-15 DIAGNOSIS — T82818A Embolism of vascular prosthetic devices, implants and grafts, initial encounter: Secondary | ICD-10-CM | POA: Diagnosis not present

## 2020-06-15 DIAGNOSIS — L98499 Non-pressure chronic ulcer of skin of other sites with unspecified severity: Secondary | ICD-10-CM | POA: Diagnosis not present

## 2020-06-15 DIAGNOSIS — I872 Venous insufficiency (chronic) (peripheral): Secondary | ICD-10-CM | POA: Diagnosis not present

## 2020-06-15 DIAGNOSIS — D509 Iron deficiency anemia, unspecified: Secondary | ICD-10-CM | POA: Diagnosis not present

## 2020-06-15 DIAGNOSIS — N2581 Secondary hyperparathyroidism of renal origin: Secondary | ICD-10-CM | POA: Diagnosis not present

## 2020-06-15 DIAGNOSIS — I739 Peripheral vascular disease, unspecified: Secondary | ICD-10-CM | POA: Diagnosis not present

## 2020-06-15 DIAGNOSIS — D631 Anemia in chronic kidney disease: Secondary | ICD-10-CM | POA: Diagnosis not present

## 2020-06-15 DIAGNOSIS — N186 End stage renal disease: Secondary | ICD-10-CM | POA: Diagnosis not present

## 2020-06-15 DIAGNOSIS — D689 Coagulation defect, unspecified: Secondary | ICD-10-CM | POA: Diagnosis not present

## 2020-06-15 DIAGNOSIS — Z992 Dependence on renal dialysis: Secondary | ICD-10-CM | POA: Diagnosis not present

## 2020-06-16 DIAGNOSIS — H903 Sensorineural hearing loss, bilateral: Secondary | ICD-10-CM | POA: Diagnosis not present

## 2020-06-16 DIAGNOSIS — H6982 Other specified disorders of Eustachian tube, left ear: Secondary | ICD-10-CM | POA: Diagnosis not present

## 2020-06-17 DIAGNOSIS — D689 Coagulation defect, unspecified: Secondary | ICD-10-CM | POA: Diagnosis not present

## 2020-06-17 DIAGNOSIS — N186 End stage renal disease: Secondary | ICD-10-CM | POA: Diagnosis not present

## 2020-06-17 DIAGNOSIS — D509 Iron deficiency anemia, unspecified: Secondary | ICD-10-CM | POA: Diagnosis not present

## 2020-06-17 DIAGNOSIS — T82818A Embolism of vascular prosthetic devices, implants and grafts, initial encounter: Secondary | ICD-10-CM | POA: Diagnosis not present

## 2020-06-17 DIAGNOSIS — Z992 Dependence on renal dialysis: Secondary | ICD-10-CM | POA: Diagnosis not present

## 2020-06-17 DIAGNOSIS — D631 Anemia in chronic kidney disease: Secondary | ICD-10-CM | POA: Diagnosis not present

## 2020-06-17 DIAGNOSIS — N2581 Secondary hyperparathyroidism of renal origin: Secondary | ICD-10-CM | POA: Diagnosis not present

## 2020-06-18 DIAGNOSIS — K219 Gastro-esophageal reflux disease without esophagitis: Secondary | ICD-10-CM | POA: Diagnosis not present

## 2020-06-18 DIAGNOSIS — R110 Nausea: Secondary | ICD-10-CM | POA: Diagnosis not present

## 2020-06-18 DIAGNOSIS — I2510 Atherosclerotic heart disease of native coronary artery without angina pectoris: Secondary | ICD-10-CM | POA: Diagnosis not present

## 2020-06-18 DIAGNOSIS — R1310 Dysphagia, unspecified: Secondary | ICD-10-CM | POA: Diagnosis not present

## 2020-06-18 DIAGNOSIS — N189 Chronic kidney disease, unspecified: Secondary | ICD-10-CM | POA: Diagnosis not present

## 2020-06-18 DIAGNOSIS — K5730 Diverticulosis of large intestine without perforation or abscess without bleeding: Secondary | ICD-10-CM | POA: Diagnosis not present

## 2020-06-18 DIAGNOSIS — N261 Atrophy of kidney (terminal): Secondary | ICD-10-CM | POA: Diagnosis not present

## 2020-06-18 DIAGNOSIS — M898X8 Other specified disorders of bone, other site: Secondary | ICD-10-CM | POA: Diagnosis not present

## 2020-06-18 DIAGNOSIS — R748 Abnormal levels of other serum enzymes: Secondary | ICD-10-CM | POA: Diagnosis not present

## 2020-06-18 DIAGNOSIS — J90 Pleural effusion, not elsewhere classified: Secondary | ICD-10-CM | POA: Diagnosis not present

## 2020-06-19 DIAGNOSIS — T82818A Embolism of vascular prosthetic devices, implants and grafts, initial encounter: Secondary | ICD-10-CM | POA: Diagnosis not present

## 2020-06-19 DIAGNOSIS — D689 Coagulation defect, unspecified: Secondary | ICD-10-CM | POA: Diagnosis not present

## 2020-06-19 DIAGNOSIS — D509 Iron deficiency anemia, unspecified: Secondary | ICD-10-CM | POA: Diagnosis not present

## 2020-06-19 DIAGNOSIS — N2581 Secondary hyperparathyroidism of renal origin: Secondary | ICD-10-CM | POA: Diagnosis not present

## 2020-06-19 DIAGNOSIS — Z992 Dependence on renal dialysis: Secondary | ICD-10-CM | POA: Diagnosis not present

## 2020-06-19 DIAGNOSIS — N186 End stage renal disease: Secondary | ICD-10-CM | POA: Diagnosis not present

## 2020-06-19 DIAGNOSIS — D631 Anemia in chronic kidney disease: Secondary | ICD-10-CM | POA: Diagnosis not present

## 2020-06-22 DIAGNOSIS — E1059 Type 1 diabetes mellitus with other circulatory complications: Secondary | ICD-10-CM | POA: Diagnosis not present

## 2020-06-22 DIAGNOSIS — T82818A Embolism of vascular prosthetic devices, implants and grafts, initial encounter: Secondary | ICD-10-CM | POA: Diagnosis not present

## 2020-06-22 DIAGNOSIS — N2581 Secondary hyperparathyroidism of renal origin: Secondary | ICD-10-CM | POA: Diagnosis not present

## 2020-06-22 DIAGNOSIS — N186 End stage renal disease: Secondary | ICD-10-CM | POA: Diagnosis not present

## 2020-06-22 DIAGNOSIS — E1040 Type 1 diabetes mellitus with diabetic neuropathy, unspecified: Secondary | ICD-10-CM | POA: Diagnosis not present

## 2020-06-22 DIAGNOSIS — E108 Type 1 diabetes mellitus with unspecified complications: Secondary | ICD-10-CM | POA: Diagnosis not present

## 2020-06-22 DIAGNOSIS — Z992 Dependence on renal dialysis: Secondary | ICD-10-CM | POA: Diagnosis not present

## 2020-06-22 DIAGNOSIS — D631 Anemia in chronic kidney disease: Secondary | ICD-10-CM | POA: Diagnosis not present

## 2020-06-22 DIAGNOSIS — D509 Iron deficiency anemia, unspecified: Secondary | ICD-10-CM | POA: Diagnosis not present

## 2020-06-22 DIAGNOSIS — D689 Coagulation defect, unspecified: Secondary | ICD-10-CM | POA: Diagnosis not present

## 2020-06-22 DIAGNOSIS — T8612 Kidney transplant failure: Secondary | ICD-10-CM | POA: Diagnosis not present

## 2020-06-22 DIAGNOSIS — Z794 Long term (current) use of insulin: Secondary | ICD-10-CM | POA: Diagnosis not present

## 2020-06-23 DIAGNOSIS — M545 Low back pain, unspecified: Secondary | ICD-10-CM | POA: Diagnosis not present

## 2020-06-23 DIAGNOSIS — Z7409 Other reduced mobility: Secondary | ICD-10-CM | POA: Diagnosis not present

## 2020-06-23 DIAGNOSIS — M2560 Stiffness of unspecified joint, not elsewhere classified: Secondary | ICD-10-CM | POA: Diagnosis not present

## 2020-06-23 DIAGNOSIS — M6281 Muscle weakness (generalized): Secondary | ICD-10-CM | POA: Diagnosis not present

## 2020-06-23 DIAGNOSIS — M25511 Pain in right shoulder: Secondary | ICD-10-CM | POA: Diagnosis not present

## 2020-06-23 DIAGNOSIS — Z789 Other specified health status: Secondary | ICD-10-CM | POA: Diagnosis not present

## 2020-06-23 DIAGNOSIS — S161XXA Strain of muscle, fascia and tendon at neck level, initial encounter: Secondary | ICD-10-CM | POA: Diagnosis not present

## 2020-06-24 ENCOUNTER — Ambulatory Visit: Payer: Medicare Other | Admitting: Family Medicine

## 2020-06-24 DIAGNOSIS — D631 Anemia in chronic kidney disease: Secondary | ICD-10-CM | POA: Diagnosis not present

## 2020-06-24 DIAGNOSIS — N2581 Secondary hyperparathyroidism of renal origin: Secondary | ICD-10-CM | POA: Diagnosis not present

## 2020-06-24 DIAGNOSIS — T82818A Embolism of vascular prosthetic devices, implants and grafts, initial encounter: Secondary | ICD-10-CM | POA: Diagnosis not present

## 2020-06-24 DIAGNOSIS — Z992 Dependence on renal dialysis: Secondary | ICD-10-CM | POA: Diagnosis not present

## 2020-06-24 DIAGNOSIS — N186 End stage renal disease: Secondary | ICD-10-CM | POA: Diagnosis not present

## 2020-06-24 DIAGNOSIS — D689 Coagulation defect, unspecified: Secondary | ICD-10-CM | POA: Diagnosis not present

## 2020-06-24 DIAGNOSIS — D509 Iron deficiency anemia, unspecified: Secondary | ICD-10-CM | POA: Diagnosis not present

## 2020-06-26 DIAGNOSIS — N2581 Secondary hyperparathyroidism of renal origin: Secondary | ICD-10-CM | POA: Diagnosis not present

## 2020-06-26 DIAGNOSIS — D509 Iron deficiency anemia, unspecified: Secondary | ICD-10-CM | POA: Diagnosis not present

## 2020-06-26 DIAGNOSIS — T82818A Embolism of vascular prosthetic devices, implants and grafts, initial encounter: Secondary | ICD-10-CM | POA: Diagnosis not present

## 2020-06-26 DIAGNOSIS — N186 End stage renal disease: Secondary | ICD-10-CM | POA: Diagnosis not present

## 2020-06-26 DIAGNOSIS — D631 Anemia in chronic kidney disease: Secondary | ICD-10-CM | POA: Diagnosis not present

## 2020-06-26 DIAGNOSIS — D689 Coagulation defect, unspecified: Secondary | ICD-10-CM | POA: Diagnosis not present

## 2020-06-26 DIAGNOSIS — Z992 Dependence on renal dialysis: Secondary | ICD-10-CM | POA: Diagnosis not present

## 2020-06-29 DIAGNOSIS — Z992 Dependence on renal dialysis: Secondary | ICD-10-CM | POA: Diagnosis not present

## 2020-06-29 DIAGNOSIS — D689 Coagulation defect, unspecified: Secondary | ICD-10-CM | POA: Diagnosis not present

## 2020-06-29 DIAGNOSIS — N186 End stage renal disease: Secondary | ICD-10-CM | POA: Diagnosis not present

## 2020-06-29 DIAGNOSIS — D509 Iron deficiency anemia, unspecified: Secondary | ICD-10-CM | POA: Diagnosis not present

## 2020-06-29 DIAGNOSIS — N2581 Secondary hyperparathyroidism of renal origin: Secondary | ICD-10-CM | POA: Diagnosis not present

## 2020-06-29 DIAGNOSIS — T82818A Embolism of vascular prosthetic devices, implants and grafts, initial encounter: Secondary | ICD-10-CM | POA: Diagnosis not present

## 2020-07-01 DIAGNOSIS — Z992 Dependence on renal dialysis: Secondary | ICD-10-CM | POA: Diagnosis not present

## 2020-07-01 DIAGNOSIS — N2581 Secondary hyperparathyroidism of renal origin: Secondary | ICD-10-CM | POA: Diagnosis not present

## 2020-07-01 DIAGNOSIS — T82818A Embolism of vascular prosthetic devices, implants and grafts, initial encounter: Secondary | ICD-10-CM | POA: Diagnosis not present

## 2020-07-01 DIAGNOSIS — D689 Coagulation defect, unspecified: Secondary | ICD-10-CM | POA: Diagnosis not present

## 2020-07-01 DIAGNOSIS — D509 Iron deficiency anemia, unspecified: Secondary | ICD-10-CM | POA: Diagnosis not present

## 2020-07-01 DIAGNOSIS — N186 End stage renal disease: Secondary | ICD-10-CM | POA: Diagnosis not present

## 2020-07-03 DIAGNOSIS — Z992 Dependence on renal dialysis: Secondary | ICD-10-CM | POA: Diagnosis not present

## 2020-07-03 DIAGNOSIS — N2581 Secondary hyperparathyroidism of renal origin: Secondary | ICD-10-CM | POA: Diagnosis not present

## 2020-07-03 DIAGNOSIS — N186 End stage renal disease: Secondary | ICD-10-CM | POA: Diagnosis not present

## 2020-07-03 DIAGNOSIS — T82818A Embolism of vascular prosthetic devices, implants and grafts, initial encounter: Secondary | ICD-10-CM | POA: Diagnosis not present

## 2020-07-03 DIAGNOSIS — D509 Iron deficiency anemia, unspecified: Secondary | ICD-10-CM | POA: Diagnosis not present

## 2020-07-03 DIAGNOSIS — D689 Coagulation defect, unspecified: Secondary | ICD-10-CM | POA: Diagnosis not present

## 2020-07-06 DIAGNOSIS — D509 Iron deficiency anemia, unspecified: Secondary | ICD-10-CM | POA: Diagnosis not present

## 2020-07-06 DIAGNOSIS — N2581 Secondary hyperparathyroidism of renal origin: Secondary | ICD-10-CM | POA: Diagnosis not present

## 2020-07-06 DIAGNOSIS — E1022 Type 1 diabetes mellitus with diabetic chronic kidney disease: Secondary | ICD-10-CM | POA: Diagnosis not present

## 2020-07-06 DIAGNOSIS — T82818A Embolism of vascular prosthetic devices, implants and grafts, initial encounter: Secondary | ICD-10-CM | POA: Diagnosis not present

## 2020-07-06 DIAGNOSIS — N186 End stage renal disease: Secondary | ICD-10-CM | POA: Diagnosis not present

## 2020-07-06 DIAGNOSIS — D689 Coagulation defect, unspecified: Secondary | ICD-10-CM | POA: Diagnosis not present

## 2020-07-06 DIAGNOSIS — Z992 Dependence on renal dialysis: Secondary | ICD-10-CM | POA: Diagnosis not present

## 2020-07-06 DIAGNOSIS — D631 Anemia in chronic kidney disease: Secondary | ICD-10-CM | POA: Diagnosis not present

## 2020-07-08 DIAGNOSIS — N2581 Secondary hyperparathyroidism of renal origin: Secondary | ICD-10-CM | POA: Diagnosis not present

## 2020-07-08 DIAGNOSIS — Z992 Dependence on renal dialysis: Secondary | ICD-10-CM | POA: Diagnosis not present

## 2020-07-08 DIAGNOSIS — N186 End stage renal disease: Secondary | ICD-10-CM | POA: Diagnosis not present

## 2020-07-08 DIAGNOSIS — E1022 Type 1 diabetes mellitus with diabetic chronic kidney disease: Secondary | ICD-10-CM | POA: Diagnosis not present

## 2020-07-08 DIAGNOSIS — D631 Anemia in chronic kidney disease: Secondary | ICD-10-CM | POA: Diagnosis not present

## 2020-07-08 DIAGNOSIS — D509 Iron deficiency anemia, unspecified: Secondary | ICD-10-CM | POA: Diagnosis not present

## 2020-07-08 DIAGNOSIS — D689 Coagulation defect, unspecified: Secondary | ICD-10-CM | POA: Diagnosis not present

## 2020-07-08 DIAGNOSIS — T82818A Embolism of vascular prosthetic devices, implants and grafts, initial encounter: Secondary | ICD-10-CM | POA: Diagnosis not present

## 2020-07-10 DIAGNOSIS — N186 End stage renal disease: Secondary | ICD-10-CM | POA: Diagnosis not present

## 2020-07-10 DIAGNOSIS — E1022 Type 1 diabetes mellitus with diabetic chronic kidney disease: Secondary | ICD-10-CM | POA: Diagnosis not present

## 2020-07-10 DIAGNOSIS — D509 Iron deficiency anemia, unspecified: Secondary | ICD-10-CM | POA: Diagnosis not present

## 2020-07-10 DIAGNOSIS — T82818A Embolism of vascular prosthetic devices, implants and grafts, initial encounter: Secondary | ICD-10-CM | POA: Diagnosis not present

## 2020-07-10 DIAGNOSIS — Z992 Dependence on renal dialysis: Secondary | ICD-10-CM | POA: Diagnosis not present

## 2020-07-10 DIAGNOSIS — D631 Anemia in chronic kidney disease: Secondary | ICD-10-CM | POA: Diagnosis not present

## 2020-07-10 DIAGNOSIS — D689 Coagulation defect, unspecified: Secondary | ICD-10-CM | POA: Diagnosis not present

## 2020-07-10 DIAGNOSIS — N2581 Secondary hyperparathyroidism of renal origin: Secondary | ICD-10-CM | POA: Diagnosis not present

## 2020-07-13 ENCOUNTER — Other Ambulatory Visit: Payer: Self-pay | Admitting: *Deleted

## 2020-07-13 DIAGNOSIS — Z992 Dependence on renal dialysis: Secondary | ICD-10-CM | POA: Diagnosis not present

## 2020-07-13 DIAGNOSIS — N186 End stage renal disease: Secondary | ICD-10-CM | POA: Diagnosis not present

## 2020-07-13 DIAGNOSIS — D689 Coagulation defect, unspecified: Secondary | ICD-10-CM | POA: Diagnosis not present

## 2020-07-13 DIAGNOSIS — T82818A Embolism of vascular prosthetic devices, implants and grafts, initial encounter: Secondary | ICD-10-CM | POA: Diagnosis not present

## 2020-07-13 DIAGNOSIS — D509 Iron deficiency anemia, unspecified: Secondary | ICD-10-CM | POA: Diagnosis not present

## 2020-07-13 DIAGNOSIS — D631 Anemia in chronic kidney disease: Secondary | ICD-10-CM | POA: Diagnosis not present

## 2020-07-13 DIAGNOSIS — N2581 Secondary hyperparathyroidism of renal origin: Secondary | ICD-10-CM | POA: Diagnosis not present

## 2020-07-13 MED ORDER — PRASUGREL HCL 10 MG PO TABS: 10.0000 mg | ORAL_TABLET | Freq: Every day | ORAL | 1 refills | Status: DC

## 2020-07-14 ENCOUNTER — Ambulatory Visit (INDEPENDENT_AMBULATORY_CARE_PROVIDER_SITE_OTHER): Payer: Medicare Other | Admitting: Family Medicine

## 2020-07-14 ENCOUNTER — Other Ambulatory Visit: Payer: Self-pay

## 2020-07-14 ENCOUNTER — Encounter: Payer: Self-pay | Admitting: Family Medicine

## 2020-07-14 ENCOUNTER — Telehealth: Payer: Self-pay | Admitting: *Deleted

## 2020-07-14 DIAGNOSIS — F419 Anxiety disorder, unspecified: Secondary | ICD-10-CM

## 2020-07-14 DIAGNOSIS — K219 Gastro-esophageal reflux disease without esophagitis: Secondary | ICD-10-CM

## 2020-07-14 NOTE — Telephone Encounter (Signed)
LVm for pt to call office to see about scheduling him an appointment for next week to talk about his elevated BP. She had originally said 3-4 week follow up from visit but wanted him to come in specific for BP next week, if pt calls back please assist in getting him an appointment scheduled. Chisom Muntean Zimmerman Rumple, CMA

## 2020-07-14 NOTE — Patient Instructions (Addendum)
It was great seeing you today!  Today we discussed your reflux symptoms. Please continue taking your medications. Please make sure that you go to your next GI appointment where they will further review imaging and determine if they need to do an endoscopy. Try to leave a 3 hour gap between bedtime and your last meal of the day. Avoid trigger foods including sodas, alcohol and some foods with high acidic content as well.   I think your anxiety is also from your reflux symptoms that seem to worsen around bedtime before you take your bedtime medications. I feel this will resolve after seeing the GI specialist.   I will fill out your form to renew your driving privileges and notify you when it is ready to be picked up.    Please follow up in 2-4 weeks for your next scheduled appointment, if anything arises between now and then, please don't hesitate to contact our office.   Thank you for allowing [place] to be a part of your medical care!  Thank you, Dr. Larae Grooms

## 2020-07-15 DIAGNOSIS — D509 Iron deficiency anemia, unspecified: Secondary | ICD-10-CM | POA: Diagnosis not present

## 2020-07-15 DIAGNOSIS — D689 Coagulation defect, unspecified: Secondary | ICD-10-CM | POA: Diagnosis not present

## 2020-07-15 DIAGNOSIS — F419 Anxiety disorder, unspecified: Secondary | ICD-10-CM | POA: Insufficient documentation

## 2020-07-15 DIAGNOSIS — T82818A Embolism of vascular prosthetic devices, implants and grafts, initial encounter: Secondary | ICD-10-CM | POA: Diagnosis not present

## 2020-07-15 DIAGNOSIS — N2581 Secondary hyperparathyroidism of renal origin: Secondary | ICD-10-CM | POA: Diagnosis not present

## 2020-07-15 DIAGNOSIS — N186 End stage renal disease: Secondary | ICD-10-CM | POA: Diagnosis not present

## 2020-07-15 DIAGNOSIS — D631 Anemia in chronic kidney disease: Secondary | ICD-10-CM | POA: Diagnosis not present

## 2020-07-15 DIAGNOSIS — Z992 Dependence on renal dialysis: Secondary | ICD-10-CM | POA: Diagnosis not present

## 2020-07-15 NOTE — Assessment & Plan Note (Signed)
-  continue current PPI regimen -briefly discussed available imaging as GI specialist will review imaging they ordered for best benefit to patient -ensure f/u with GI specialist next week, discussed possibility of performing EGD as patient may benefit given persistent symptoms, defer to GI -f/u in 2-4 weeks after GI appointment, consider H. Pylori testing at this time

## 2020-07-15 NOTE — Assessment & Plan Note (Addendum)
-  GAD-7 score of 3, symptoms likely triggered by reflux symptoms causing patient discomfort -reassurance provided, encouraged patient to try to use limited water with bedtime meds and trying to take them 1-2 hours before bed if appropriate -f/u in 2-4 weeks as I suspect this will resolve as GERD improves or even resolves

## 2020-07-15 NOTE — Progress Notes (Signed)
    SUBJECTIVE:   CHIEF COMPLAINT / HPI:   Patient presents today to review imaging that was ordered by GI specialist he recently was seen by, he is accompanied by his caregiver. He has been having worsening reflux symptoms. Currently on omeprazole and famotidine which helps but fails to provide long-term relief. Endorsing reflux and burning sensation after most meals along with recurrent cough with clear sputum that resembles foam. Drinks soda regularly but states that this does not worsen his symptoms but water seems to do so. Denies epigastric pain or other abdominal pain. Eats dinner about 2-3 hours every bedtime. Endorsing globulus sensation as well. Patient is scheduled to see GI specialist in a week.  Anxiety Endorses anxiety primarily when he takes his medications at bedtime which he takes with a lot of water. States that when he does this, he experiences worsening cough at this time due to his reflux which he identifies as the source of his anxiety. Denies anxious feelings at other times of the day. Denies new stressors.   OBJECTIVE:   BP (!) 173/68   Pulse (!) 103   Ht '5\' 8"'$  (1.727 m)   Wt 266 lb 9.6 oz (120.9 kg)   SpO2 92% Comment: '@2L'$   BMI 40.54 kg/m   General: Patient well-appearing, in no acute distress. CV:RRR, no murmurs or gallops auscultated Resp: CTAB, no wheezing, rales or rhonchi Abdomen: firm, mild distention noted, non-tender upon palpation, BS+ Ext: left LE prosthesis, no right LE edema noted, radial pulses present Neuro: ambulates with assistance of wheelchair Psych: mood appropriate, denies nervousoness  ASSESSMENT/PLAN:   GERD (gastroesophageal reflux disease) -continue current PPI regimen -briefly discussed available imaging as GI specialist will review imaging they ordered for best benefit to patient -ensure f/u with GI specialist next week, discussed possibility of performing EGD as patient may benefit given persistent symptoms, defer to GI -f/u in 2-4  weeks after GI appointment, consider H. Pylori testing at this time    Anxiety -GAD-7 score of 3, symptoms likely triggered by reflux symptoms causing patient discomfort -reassurance provided, encouraged patient to try to use limited water with bedtime meds and trying to take them 1-2 hours before bed if appropriate -f/u in 2-4 weeks as I suspect this will resolve as GERD improves or even resolves      Donney Dice, [employer]

## 2020-07-16 NOTE — Telephone Encounter (Signed)
Thank you and thanks for notifying me!

## 2020-07-16 NOTE — Telephone Encounter (Signed)
Contacted pt and he is schedule on ATC 07/23/20 '@9'$ :50 am. Routing to PCP as an FYI. Kegan Mckeithan Zimmerman Rumple, CMA

## 2020-07-17 DIAGNOSIS — Z992 Dependence on renal dialysis: Secondary | ICD-10-CM | POA: Diagnosis not present

## 2020-07-17 DIAGNOSIS — D509 Iron deficiency anemia, unspecified: Secondary | ICD-10-CM | POA: Diagnosis not present

## 2020-07-17 DIAGNOSIS — T82818A Embolism of vascular prosthetic devices, implants and grafts, initial encounter: Secondary | ICD-10-CM | POA: Diagnosis not present

## 2020-07-17 DIAGNOSIS — D689 Coagulation defect, unspecified: Secondary | ICD-10-CM | POA: Diagnosis not present

## 2020-07-17 DIAGNOSIS — N186 End stage renal disease: Secondary | ICD-10-CM | POA: Diagnosis not present

## 2020-07-17 DIAGNOSIS — D631 Anemia in chronic kidney disease: Secondary | ICD-10-CM | POA: Diagnosis not present

## 2020-07-17 DIAGNOSIS — N2581 Secondary hyperparathyroidism of renal origin: Secondary | ICD-10-CM | POA: Diagnosis not present

## 2020-07-20 DIAGNOSIS — Z992 Dependence on renal dialysis: Secondary | ICD-10-CM | POA: Diagnosis not present

## 2020-07-20 DIAGNOSIS — D509 Iron deficiency anemia, unspecified: Secondary | ICD-10-CM | POA: Diagnosis not present

## 2020-07-20 DIAGNOSIS — D689 Coagulation defect, unspecified: Secondary | ICD-10-CM | POA: Diagnosis not present

## 2020-07-20 DIAGNOSIS — N2581 Secondary hyperparathyroidism of renal origin: Secondary | ICD-10-CM | POA: Diagnosis not present

## 2020-07-20 DIAGNOSIS — N186 End stage renal disease: Secondary | ICD-10-CM | POA: Diagnosis not present

## 2020-07-20 DIAGNOSIS — D631 Anemia in chronic kidney disease: Secondary | ICD-10-CM | POA: Diagnosis not present

## 2020-07-20 DIAGNOSIS — T82818A Embolism of vascular prosthetic devices, implants and grafts, initial encounter: Secondary | ICD-10-CM | POA: Diagnosis not present

## 2020-07-21 DIAGNOSIS — I152 Hypertension secondary to endocrine disorders: Secondary | ICD-10-CM | POA: Diagnosis not present

## 2020-07-21 DIAGNOSIS — E1059 Type 1 diabetes mellitus with other circulatory complications: Secondary | ICD-10-CM | POA: Diagnosis not present

## 2020-07-21 DIAGNOSIS — J811 Chronic pulmonary edema: Secondary | ICD-10-CM | POA: Diagnosis not present

## 2020-07-21 DIAGNOSIS — K219 Gastro-esophageal reflux disease without esophagitis: Secondary | ICD-10-CM | POA: Diagnosis not present

## 2020-07-21 DIAGNOSIS — N186 End stage renal disease: Secondary | ICD-10-CM | POA: Diagnosis not present

## 2020-07-21 DIAGNOSIS — R748 Abnormal levels of other serum enzymes: Secondary | ICD-10-CM | POA: Diagnosis not present

## 2020-07-22 DIAGNOSIS — D509 Iron deficiency anemia, unspecified: Secondary | ICD-10-CM | POA: Diagnosis not present

## 2020-07-22 DIAGNOSIS — N186 End stage renal disease: Secondary | ICD-10-CM | POA: Diagnosis not present

## 2020-07-22 DIAGNOSIS — D689 Coagulation defect, unspecified: Secondary | ICD-10-CM | POA: Diagnosis not present

## 2020-07-22 DIAGNOSIS — N2581 Secondary hyperparathyroidism of renal origin: Secondary | ICD-10-CM | POA: Diagnosis not present

## 2020-07-22 DIAGNOSIS — T82818A Embolism of vascular prosthetic devices, implants and grafts, initial encounter: Secondary | ICD-10-CM | POA: Diagnosis not present

## 2020-07-22 DIAGNOSIS — D631 Anemia in chronic kidney disease: Secondary | ICD-10-CM | POA: Diagnosis not present

## 2020-07-22 DIAGNOSIS — Z992 Dependence on renal dialysis: Secondary | ICD-10-CM | POA: Diagnosis not present

## 2020-07-23 ENCOUNTER — Ambulatory Visit: Payer: Medicare Other

## 2020-07-23 DIAGNOSIS — Z992 Dependence on renal dialysis: Secondary | ICD-10-CM | POA: Diagnosis not present

## 2020-07-23 DIAGNOSIS — N186 End stage renal disease: Secondary | ICD-10-CM | POA: Diagnosis not present

## 2020-07-23 DIAGNOSIS — T8612 Kidney transplant failure: Secondary | ICD-10-CM | POA: Diagnosis not present

## 2020-07-24 ENCOUNTER — Ambulatory Visit (INDEPENDENT_AMBULATORY_CARE_PROVIDER_SITE_OTHER): Payer: Medicare Other

## 2020-07-24 ENCOUNTER — Encounter: Payer: Self-pay | Admitting: Internal Medicine

## 2020-07-24 ENCOUNTER — Ambulatory Visit: Payer: Medicare Other | Admitting: Internal Medicine

## 2020-07-24 ENCOUNTER — Other Ambulatory Visit: Payer: Self-pay

## 2020-07-24 DIAGNOSIS — R0600 Dyspnea, unspecified: Secondary | ICD-10-CM | POA: Diagnosis not present

## 2020-07-24 DIAGNOSIS — J90 Pleural effusion, not elsewhere classified: Secondary | ICD-10-CM | POA: Diagnosis not present

## 2020-07-24 DIAGNOSIS — J9611 Chronic respiratory failure with hypoxia: Secondary | ICD-10-CM

## 2020-07-24 DIAGNOSIS — R059 Cough, unspecified: Secondary | ICD-10-CM | POA: Diagnosis not present

## 2020-07-24 DIAGNOSIS — R0609 Other forms of dyspnea: Secondary | ICD-10-CM

## 2020-07-24 DIAGNOSIS — Z992 Dependence on renal dialysis: Secondary | ICD-10-CM | POA: Diagnosis not present

## 2020-07-24 DIAGNOSIS — N186 End stage renal disease: Secondary | ICD-10-CM | POA: Diagnosis not present

## 2020-07-24 DIAGNOSIS — N2581 Secondary hyperparathyroidism of renal origin: Secondary | ICD-10-CM | POA: Diagnosis not present

## 2020-07-24 DIAGNOSIS — R058 Other specified cough: Secondary | ICD-10-CM

## 2020-07-24 DIAGNOSIS — D689 Coagulation defect, unspecified: Secondary | ICD-10-CM | POA: Diagnosis not present

## 2020-07-24 DIAGNOSIS — T82818A Embolism of vascular prosthetic devices, implants and grafts, initial encounter: Secondary | ICD-10-CM | POA: Diagnosis not present

## 2020-07-24 NOTE — Patient Instructions (Addendum)
Take pepcid 20 mg after supper    GERD (REFLUX)  is an extremely common cause of respiratory symptoms just like yours , many times with no obvious heartburn at all.    It can be treated with medication, but also with lifestyle changes including elevation of the head of your bed (ideally with 6 -8inch blocks under the headboard of your bed),  Smoking cessation, avoidance of late meals or large meals  excessive alcohol, and avoid fatty foods, chocolate, peppermint, colas, red wine, and acidic juices such as orange juice.  NO MINT OR MENTHOL PRODUCTS SO NO COUGH DROPS  USE SUGARLESS CANDY INSTEAD (Jolley ranchers or Stover's or Life Savers) or even ice chips will also do - the key is to swallow to prevent all throat clearing. NO OIL BASED VITAMINS - use powdered substitutes.  Avoid fish oil when coughing.  Please remember to go to the  x-ray department  for your tests - we will call you with the results when they are available     Please schedule a follow up visit in 3 months but call sooner if needed

## 2020-07-24 NOTE — Progress Notes (Signed)
$'@Patient'Y$  ID: Dale [name], male    DOB: 01/10/1966     MRN: [id number]    HPI: 47 yowm MO/ never smoker  with known DM and ESRD on Dialysis and Grade I diastolic dysfunction by Echo 11/01/16 . Seen for pulmonary consult during hospitalization in 12/2015 for acute resp failure and  post op stridor found to have tracheomalacia on CT neck.   TESTS  12/2015 >CT neck , tracheomalacia , highly atelectatic appearance of trachea.  01/29/16 HRCT Chest >very mild patchy GG attenuation, favored for mild interstitial edema. Severe tracheobronchomalacia.   Sleep study 03/02/2016 >Severe OSA (This study showed severe obstructive sleep apnea with an AH of 76 and SaO2 low of 56%. -  rec  trial of BiPAP therapy on 23/19 cm H2O.   06/22/2017 NP Follow up : OSA   he returns for a follow-up.  Patient was seen previously in 2017 for suspected sleep apnea.  He was set up for a sleep study done on November eighth 2017.  This showed severe sleep apnea with an AHI at 76, SaO2 low at 56%.  He was recommended to begin on BiPAP.  Patient says he did get his BiPAP machine but says he could not tolerate this.  He has severe night terrors claustrophobia.  He ended up returning this back to the DME company.  He did not follow-up since 2017.  Patient is on dialysis.  He is in a wheelchair with previous left leg amputation with prosthesis.  Says it is hard for him to get to appointments due to chronic pain and is on daily narcotics.[name] Kitchen rec Set up for CPAP titration study. > changed to split night study and not done as of 08/10/2017  Follow up in 2 months with Dr. Halford Chessman  In [facility]     08/10/2017 acute extended ov/Flora Ratz re: sob/ desats Chief Complaint  Patient presents with  . Acute Visit    Dropping O2 for 3 weeks. O2 has been in the lower 70s-upper 80s. Productive cough with clear mucus. Denies any fever.   sleeping propped up 30-40 degrees and 3lpm just at hs  One week prior to OV  Noted worse sats and need for daytime 02  when bending over in w/c (he is not ambulatory) assoc with hacking cough but minimal mucoid sputum production rec Pantoprazole (protonix) 40 mg  Take 30- 60 min before your first and last meals of the day  GERD diet  Please see patient coordinator before you leave today  to schedule sleep study and ambulatory 02 titration to see if eligible for POC  Need to pull off as much fluid as you can at dialysis    07/24/2020  f/u ov/Verne Lanuza re:  MO/ HD dep  Worse doe  Chief Complaint  Patient presents with  . Follow-up    Sob worse since last visit. When taking a deep breathe, having pain on right side of chest. 2L continuous.   Dyspnea:  Limited by R knee pain/ L BKA / parks w/c at door of br at home and walks into br 2lpm is easier than without it  Cough not much  Sleeping: as below SABA use: none  02: 2lpm 24/7  incliner 60 - 90  Degrees x for a year / no daytime hypersomnolence  Covid status:   Moderna x 3 and infected as well 05/08/20  R CP  X sev months every time he takes a deep Reports "foam in mouth after supper" x  sev months/ some dysphagia s overt sinus complaints    No obvious day to day or daytime variability or assoc excess/ purulent sputum or mucus plugs or hemoptysis or cp or chest tightness, subjective wheeze or overt   hb symptoms.   Sleeping  without nocturnal  or early am exacerbation  of respiratory  c/o's or need for noct saba. Also denies any obvious fluctuation of symptoms with weather or environmental changes or other aggravating or alleviating factors except as outlined above   No unusual exposure hx or h/o childhood pna/ asthma or knowledge of premature birth.  Current Allergies, Complete Past Medical History, Past Surgical History, Family History, and Social History were reviewed in [employer] record.  ROS  The following are not active complaints unless bolded Hoarseness, sore throat, dysphagia, dental problems, itching, sneezing,  nasal  congestion or discharge of excess mucus or purulent secretions, ear ache,   fever, chills, sweats, unintended wt loss or wt gain, classically pleuritic or exertional cp,  orthopnea pnd or arm/hand swelling  or leg swelling, presyncope, palpitations, abdominal pain, anorexia, nausea, vomiting, diarrhea  or change in bowel habits or change in bladder habits, change in stools or change in urine, dysuria, hematuria,  rash, arthralgias, visual complaints, headache, numbness, weakness or ataxia or problems with walking/mostly wc bound/  or coordination,  change in mood or  memory.        Current Meds  Medication Sig  . acetaminophen (TYLENOL) 500 MG tablet Take 1,000 mg by mouth every 6 (six) hours as needed (pain/headaches.).   [name] Kitchen allopurinol (ZYLOPRIM) 100 MG tablet Take 1 tablet (100 mg total) by mouth every other day.  [name] Kitchen aspirin EC 81 MG tablet Take 81 mg by mouth at bedtime.  [name] Kitchen atorvastatin (LIPITOR) 20 MG tablet Take 20 mg by mouth daily.  . B Complex-C-Folic Acid (DIALYVITE Q000111Q) 0.8 MG TABS Take 1 tablet by mouth daily.  . calcium acetate (PHOSLO) 667 MG capsule Take 2,001 mg by mouth 3 (three) times daily with meals.  . cetirizine (ZYRTEC) 10 MG tablet Take 10 mg by mouth daily.  . famotidine (PEPCID) 20 MG tablet Take 20 mg by mouth 2 (two) times daily.  . fluticasone (FLONASE) 50 MCG/ACT nasal spray Place 2 sprays into both nostrils daily as needed for allergies.   [name] Kitchen gabapentin (NEURONTIN) 300 MG capsule Take 1 capsule (300 mg total) by mouth 2 (two) times daily as needed.  [name] Kitchen GLUCAGON EMERGENCY 1 MG injection Inject 1 mg into the muscle daily as needed (low blood sugar).  . Glucose-Cholecalciferol (RELION GLUCOSE SHOT) LIQD Take 1 Dose by mouth daily as needed (low blood sugar).  [name] Kitchen HYDROmorphone (DILAUDID) 2 MG tablet Take 1 tablet (2 mg total) by mouth daily as needed for severe pain.  [name] Kitchen insulin lispro (HUMALOG) 100 UNIT/ML injection Inject 75-120 Units into the skin continuous. Per insulin  pump  . levothyroxine (SYNTHROID, LEVOTHROID) 112 MCG tablet Take 112 mcg by mouth at bedtime.   [name] Kitchen loperamide (IMODIUM A-D) 2 MG tablet Take 2-4 mg by mouth 4 (four) times daily as needed for diarrhea or loose stools.  . Melatonin 5 MG TABS Take 5 mg by mouth at bedtime.  . midodrine (PROAMATINE) 10 MG tablet Take 1 tablet (10 mg total) by mouth 3 (three) times daily. Takes 1 tab in mornings of dialysis and takes 2 tabs 30 mins before starting dialysis (m,w,f)  . nitroGLYCERIN (NITROSTAT) 0.4 MG SL tablet Place 0.4 mg under the tongue every  5 (five) minutes as needed for chest pain.  Vladimir Faster Glycol-Propyl Glycol (SYSTANE ULTRA PF OP) Place 1 drop into both eyes 4 (four) times daily as needed (dry eyes).  Vladimir Faster Glycol-Propyl Glycol 0.4-0.3 % SOLN Place 1 drop into both eyes 2 (two) times daily.  . polyethylene glycol (MIRALAX / GLYCOLAX) 17 g packet Take 17 g by mouth daily as needed.  . prasugrel (EFFIENT) 10 MG TABS tablet Take 1 tablet (10 mg total) by mouth daily. Please keep upcoming appt in January 2022 before anymore refills. Thank you  . promethazine (PHENERGAN) 25 MG tablet Take 25 mg by mouth every 6 (six) hours as needed for nausea or vomiting.  [name] Kitchen rOPINIRole (REQUIP) 2 MG tablet Take 2 mg by mouth every evening.  . sevelamer carbonate (RENVELA) 800 MG tablet Take 1,600 mg by mouth 3 (three) times daily with meals.  [name] Kitchen testosterone cypionate (DEPOTESTOSTERONE CYPIONATE) 200 MG/ML injection Inject 200 mg into the muscle every 14 (fourteen) days.   [name] Kitchen triamcinolone (KENALOG) 0.1 % Apply 1 application topically 2 (two) times daily.                            Physical Exam     07/24/2020         251 reported   07/27/17 293 lb 9.6 oz (133.2 kg)  07/18/17 285 lb 0.9 oz (129.3 kg)  07/11/17 283 lb (128.4 kg)     Vital signs reviewed  07/24/2020  - Note at rest 02 sats  97% on 2lpm    General appearance:   W/c bound elderly wm nad       HEENT : pt wearing mask not removed  for exam due to covid -19 concerns.    NECK :  without JVD/Nodes/TM/ nl carotid upstrokes bilaterally   LUNGS: no acc muscle use,  Nl contour chest which is clear to A and P bilaterally without cough on insp or exp maneuvers   CV:  RRR  no s3 or murmur or increase in P2, and no edema   ABD:  soft and nontender with nl inspiratory excursion in the supine position. No bruits or organomegaly appreciated, bowel sounds nl  MS:  Nl gait/ ext warm with L BKA/prosthesis - on R no calf tenderness, cyanosis or clubbing No obvious joint restrictions   SKIN: warm and dry without lesions    NEURO:  alert, approp, nl sensorium with  no motor or cerebellar deficits apparent.     CXR PA and Lateral:   07/24/2020 :   Done post am HD I personally reviewed images and agree with radiology impression as follows:    Evidence of early pulmonary edema/CHF and small bilateral pleural effusions.  Assessment & Plan:

## 2020-07-25 ENCOUNTER — Encounter: Payer: Self-pay | Admitting: Internal Medicine

## 2020-07-25 NOTE — Assessment & Plan Note (Signed)
08/10/2017 ambulatory 02 titration to see if eligible for POC  - as of 07/24/2020  2lpm 24/7   Advised: Make sure you check your oxygen saturation  at your highest level of activity  to be sure it stays over 90% and adjust  02 flow upward to maintain this level if needed but remember to turn it back to previous settings when you stop (to conserve your supply).

## 2020-07-25 NOTE — Assessment & Plan Note (Addendum)
Echo 11/01/16 LVEF 60-65%, moderate LVH, normal wall motion, grade 1 DD with elevated LV filling pressure, trivial MR, mild TR, RVSP 39 mmHg, normal IVC.  So he has known  diastolic dysfunction and cxr again looks wet considering he had HD today though he denies any worse sob Sunday pm's p missing one day of HD and   some of the changes may be due to residual from covid 19 infection in Jan 2022  - would keep as dry as tol and monitor sats as above  F/u at 77m sooner prn

## 2020-07-25 NOTE — Assessment & Plan Note (Signed)
rec add pepcid 20 mg a supper to see if any effect on "foam in mouth" p eating suggesting gerd mech  Diet also reviewed.          Each maintenance medication was reviewed in detail including emphasizing most importantly the difference between maintenance and prns and under what circumstances the prns are to be triggered using an action plan format where appropriate.  Total time for H and P, chart review, counseling, reviewing 02  device(s) and generating customized AVS unique to this office visit / same day charting = 25 min

## 2020-07-27 DIAGNOSIS — T82818A Embolism of vascular prosthetic devices, implants and grafts, initial encounter: Secondary | ICD-10-CM | POA: Diagnosis not present

## 2020-07-27 DIAGNOSIS — N186 End stage renal disease: Secondary | ICD-10-CM | POA: Diagnosis not present

## 2020-07-27 DIAGNOSIS — Z992 Dependence on renal dialysis: Secondary | ICD-10-CM | POA: Diagnosis not present

## 2020-07-27 DIAGNOSIS — L299 Pruritus, unspecified: Secondary | ICD-10-CM | POA: Diagnosis not present

## 2020-07-27 DIAGNOSIS — N2581 Secondary hyperparathyroidism of renal origin: Secondary | ICD-10-CM | POA: Diagnosis not present

## 2020-07-27 DIAGNOSIS — D689 Coagulation defect, unspecified: Secondary | ICD-10-CM | POA: Diagnosis not present

## 2020-07-27 DIAGNOSIS — D631 Anemia in chronic kidney disease: Secondary | ICD-10-CM | POA: Diagnosis not present

## 2020-07-28 ENCOUNTER — Ambulatory Visit: Payer: Medicare Other | Admitting: Primary Care

## 2020-07-28 NOTE — Progress Notes (Signed)
Virtual Visit via Video Note   This visit type was conducted due to national recommendations for restrictions regarding the COVID-19 Pandemic (e.g. social distancing) in an effort to limit this patient's exposure and mitigate transmission in our community.  Due to his co-morbid illnesses, this patient is at least at moderate risk for complications without adequate follow up.  This format is felt to be most appropriate for this patient at this time.  All issues noted in this document were discussed and addressed.  A limited physical exam was performed with this format.  Please refer to the patient's chart for his consent to telehealth for [facility].  The patient was identified using 2 identifiers.  Date:  07/29/2020   ID:  Dale [name], DOB 02/13/1966, MRN [id number]  Patient Location: Home Provider Location: Office/Clinic  PCP:  Donney Dice, DO  Cardiologist:  Lauree Chandler, MD  Electrophysiologist:  None   Evaluation Performed:  Follow-Up Visit  Chief Complaint: f/u CHF per GI  History of Present Illness:    Dale [name] is a 55 y.o. male with CAD (PTCA to RCA 02/2014, rotablator atherectomy mid RCA/distal RCA, DES to mLAD and balloon angioplasty to distal RCA, DES to diagonal vessel 2017), (?Plavix allergy, nausea with ticagrelor), IV dye allergy, HFpEF, remote pericardial effusion not seen on recent echoes, ESRD s/p failed renal transplant (failed after admission for salmonella sepsis in 2014), hypotension (takes midodrine on HD days), HTN, DM, severe OSA (managed by pulmonology), tracheomalacia, anemia, obesity, PAD s/p L BKA (followed by [facility]), fibromyalgia, hypothyroidism, HLD. He was last seen by Richardson Dopp via video visit in January and was felt stable from a cardiac standpoint. He had recently had Covid treated with mAb. He was recently seen by GI who commented that they had seen evidence of early pulmonary edema/CHF on a CXR 07/24/20 and therefore he was advised to  f/u with cardiology for further management. His volume status is managed by dialysis.  The patient is seen virtually today and is very astute with his recent medical history. Because he is followed at several outside facilities, he supplements available records with firsthand report. He reports he recently admitted a few times at outside hospital for issues with dyspnea over the last few months - had bacterial PNA in December, then Covid in January, then in February was admitted with hypoxic respiratory respiratory failure in the setting of fluid overload. He required additional HD at that time. 2D Echo 05/28/20 ([facility] portal) showed EF 55-60%, mild LVH, mild MR/TR, moderate pulm HTN. He says a few changes have occurred since that time. He is no longer requiring midodrine and in fact had amlodipine added to his regimen during hospitalization for HTN. He also reports that since that time his nephrology team has been adjusting his dry weight and challenging with additional removal at each session and he states he is gradually feeling better. He saw pulmonology who gave recommendations including pulling off as much fluid as they can at HD. Their note indicates CXR was obtained after missing one day of HD. His weight is down 19lb since March, and by his report 42lb since his illnesses in the winter. No CP. He is also working on portion sizes. His wife is a Radio producer.  Labs Independently Reviewed 05/2020 Hgb 8.6, Plt 261, K 4.0, Cr 8.66, albumin 3.3, A1c 7.0, LDL 39, trig 320  Past Medical History:  Diagnosis Date  . (HFpEF) heart failure with preserved ejection fraction ([place])  a. Echo 3/13:  EF 55-60%;  b. Echo (11/15):  Mild LVH, EF 60-65%, no RWMA, Gr 1 DD, MAC, mild LAE, normal RVF, mild RAE;  c.  Echo 5/16:  severe LVH, EF 55-60%, no RWMA, Gr 1 DD, MAC, mild LAE // Echo 7/18: EF 60-65, Gr 1 DD  . Anal fissure   . Anemia of chronic disease   . Arthritis    "hands, right knee" (03/19/2014) no  cartlidge in right knee  . CAD (coronary artery disease)    a. Abnl nuc ->LHC (11/15):  mid to dist Dx 80%, mid RCA 99% (functional CTO), dist RCA 80% >> PCI: balloon angioplasty to mid RCA (could not deliver stent) - staged PCI Rotoblator atherectomy/DES to [facility]. b. NSTEMI 12/2015 s/p DES to diagonal.  . Cholecystitis    a. 08/27/2011  . Claustrophobia    when things get around his face.   . Complication of anesthesia   . Difficult intubation    only once in 2015 at [facility] haven't had a problem since then  . Diverticulosis   . Esophageal dysmotility   . ESRD (end stage renal disease) ([place])    a. 1995 s/p cadaveric transplant w/ susbequent failure after 18 yrs;  b.Dialysis initiated 07/2011 [facility] M[facility]  . Fibromyalgia   . Gastroparesis   . GERD (gastroesophageal reflux disease)   . Gout    PMH  . History of blood transfusion   . History of COVID-19 04/2020   tx with sotrovimab (mAb) and molnupiravir (nucleoside analog)  . History of kidney stones    early 1990's  . HLD (hyperlipidemia)   . Hypertension   . Hypothyroidism   . Inguinal lymphadenopathy    a. bilateral - s/p biopsy 07/2011  . Insulin dependent diabetes mellitus    Type I  . Monilial esophagitis ([place])   . Morbid obesity ([place])   . OSA (obstructive sleep apnea)    adjustable bed and uses O2 L at bedtime  . PAD (peripheral artery disease) ([facility])    a. s/p L BKA.  [name] Kitchen Pericardial effusion    a.  Small by CT 08/22/11;  b.  Large by CT 08/27/11. c. Not seen on 11/2015 echo.  . Pneumonia ~ 2007  . PONV (postoperative nausea and vomiting)    vomited once after a procedure.  . T1DM (type 1 diabetes mellitus) ([place])   . Tracheobronchomalacia    Past Surgical History:  Procedure Laterality Date  . A/V FISTULAGRAM Left 07/26/2016   Procedure: A/V Fistulagram;  Surgeon: Waynetta Sandy, MD;  Location: [place] CV LAB;  Service: Cardiovascular;  Laterality: Left;  . A/V FISTULAGRAM N/A 07/11/2017   Procedure:  A/V FISTULAGRAM - Left AV;  Surgeon: Waynetta Sandy, MD;  Location: [place] CV LAB;  Service: Cardiovascular;  Laterality: N/A;  . AMPUTATION OF REPLICATED TOES Right   . ANGIOPLASTY  04/09/2014   RCA   . APPENDECTOMY  ~ 2006  . ARTERIOVENOUS GRAFT PLACEMENT Left 1993?   forearm  . ARTERIOVENOUS GRAFT PLACEMENT Right 1993?   leg  . ARTERIOVENOUS GRAFT PLACEMENT Left    Thigh  . Arteriovenous Graft Removed Left    Thigh  . AV FISTULA PLACEMENT  07/29/2011   Procedure: ARTERIOVENOUS (AV) FISTULA CREATION;  Surgeon: Mal Misty, MD;  Location: [place];  Service: Vascular;  Laterality: Right;  Brachial cephalic  . AV FISTULA PLACEMENT Left 01/20/2015   Procedure: LIGATION OF RIGHT ARTERIOVENOUS FISTULA WITH EXCISION OF ANEURYSM;  Surgeon: Angelia Mould, MD;  Location: [place];  Service: Vascular;  Laterality: Left;  . AV FISTULA PLACEMENT Left 04/30/2015   Procedure: CREATION OF LEFT ARM BRACHIO-CEPHALIC ARTERIOVENOUS (AV) FISTULA ;  Surgeon: Angelia Mould, MD;  Location: [place];  Service: Vascular;  Laterality: Left;  . AV Graft PLACEMENT Left 1993?   "attempted one in my wrist; didn't take"  . BASCILIC VEIN TRANSPOSITION Left 11/01/2018   Procedure: FIRST STAGE BASILIC VEIN TRANSPOSITION  LEFT ARM;  Surgeon: Serafina Mitchell, MD;  Location: [place];  Service: Vascular;  Laterality: Left;  . BASCILIC VEIN TRANSPOSITION Left 12/27/2018   Procedure: SECOND STAGE BASILIC VEIN TRANSPOSITION  LEFT ARM;  Surgeon: Serafina Mitchell, MD;  Location: [place];  Service: Vascular;  Laterality: Left;  . BELOW KNEE LEG AMPUTATION Left 2010  . CARDIAC CATHETERIZATION  03/19/2014  . CARDIAC CATHETERIZATION  03/19/2014   Procedure: CORONARY BALLOON ANGIOPLASTY;  Surgeon: Burnell Blanks, MD;  Location[facility] C[facility] LAB;  Service: Cardiovascular;;  . CARDIAC CATHETERIZATION N/A 01/01/2016   Procedure: Left Heart Cath and Coronary Angiography;  Surgeon: Troy Sine, MD;  Location: [place] CV LAB;  Service: Cardiovascular;  Laterality: N/A;  . CARDIAC CATHETERIZATION N/A 01/01/2016   Procedure: Coronary Stent Intervention;  Surgeon: Troy Sine, MD;  Location: [place] CV LAB;  Service: Cardiovascular;  Laterality: N/A;  . CARPAL TUNNEL RELEASE Bilateral after 2005  . CATARACT EXTRACTION W/ INTRAOCULAR LENS  IMPLANT, BILATERAL Bilateral 1990's  . COLONOSCOPY  08/29/2011   Procedure: COLONOSCOPY;  Surgeon: Jerene Bears, MD;  Location: [place];  Service: Gastroenterology;  Laterality: N/A;  . COLONOSCOPY WITH PROPOFOL N/A 04/26/2016   Procedure: COLONOSCOPY WITH PROPOFOL;  Surgeon: Jerene Bears, MD;  Location: [facility] ENDOSCOPY;  Service: Gastroenterology;  Laterality: N/A;  . CORONARY ANGIOPLASTY WITH STENT PLACEMENT  04/09/2014       ptca/des mid lad   . ESOPHAGOGASTRODUODENOSCOPY N/A 12/25/2015   Procedure: ESOPHAGOGASTRODUODENOSCOPY (EGD);  Surgeon: Mauri Pole, MD;  Location: [facility] ENDOSCOPY;  Service: Endoscopy;  Laterality: N/A;  bedside  . ESOPHAGOGASTRODUODENOSCOPY (EGD) WITH PROPOFOL N/A 04/26/2016   Procedure: ESOPHAGOGASTRODUODENOSCOPY (EGD) WITH PROPOFOL;  Surgeon: Jerene Bears, MD;  Location: [facility] ENDOSCOPY;  Service: Gastroenterology;  Laterality: N/A;  . EYE SURGERY Bilateral    cataract  . FEMORAL ARTERY REPAIR  04/09/2014   ANGIOSEAL  . INSERTION OF DIALYSIS CATHETER  08/01/2011   Procedure: INSERTION OF DIALYSIS CATHETER;  Surgeon: Rosetta Posner, MD;  Location: [place];  Service: Vascular;  Laterality: Right;  insertion of dialysis catheter on right internal jugular vein  . INSERTION OF DIALYSIS CATHETER Right 01/20/2015   Procedure: INSERTION OF DIALYSIS CATHETER - RIGHT INTERNAL JUGULAR ;  Surgeon: Angelia Mould, MD;  Location: [place];  Service: Vascular;  Laterality: Right;  . INSERTION OF DIALYSIS CATHETER Right 08/02/2018   Procedure: Insertion Of Dialysis Catheter;  Surgeon: Angelia Mould, MD;  Location: [facility] OR;  Service: Vascular;   Laterality: Right;  . KIDNEY TRANSPLANT  04/25/1993  . LEFT HEART CATHETERIZATION WITH CORONARY ANGIOGRAM N/A 03/19/2014   Procedure: LEFT HEART CATHETERIZATION WITH CORONARY ANGIOGRAM;  Surgeon: Burnell Blanks, MD;  Location: [facility] CATH LAB;  Service: Cardiovascular;  Laterality: N/A;  . LIGATION OF ARTERIOVENOUS  FISTULA Left 11/01/2018   Procedure: Ligation Of  Brachio Cephalic Arteriovenous  Fistula;  Surgeon: Serafina Mitchell, MD;  Location: [facility] OR;  Service: Vascular;  Laterality: Left;  . LIGATION OF  ARTERIOVENOUS  FISTULA Left 02/19/2019   Procedure: LIGATION OF ARTERIOVENOUS  FISTULA LEFT ARM;  Surgeon: Angelia Mould, MD;  Location: [place];  Service: Vascular;  Laterality: Left;  . LYMPH NODE BIOPSY  08/10/2011   Procedure: LYMPH NODE BIOPSY;  Surgeon: Merrie Roof, MD;  Location: [place];  Service: General;  Laterality: Left;  left groin lymph biopsy  . NEUROPLASTY / TRANSPOSITION ULNAR NERVE AT ELBOW Left after 2005  . PATCH ANGIOPLASTY Left 08/09/2016   Procedure: LEFT  ARTERIOVENOUS FISTULA PATCH ANGIOPLASTY;  Surgeon: Waynetta Sandy, MD;  Location: [place];  Service: Vascular;  Laterality: Left;  . PERCUTANEOUS CORONARY ROTOBLATOR INTERVENTION (PCI-R) N/A 04/09/2014   Procedure: PERCUTANEOUS CORONARY ROTOBLATOR INTERVENTION (PCI-R);  Surgeon: Burnell Blanks, MD;  Location: [facility] CATH LAB;  Service: Cardiovascular;  Laterality: N/A;  . PERIPHERAL VASCULAR BALLOON ANGIOPLASTY  07/11/2017   Procedure: PERIPHERAL VASCULAR BALLOON ANGIOPLASTY;  Surgeon: Waynetta Sandy, MD;  Location: [place] CV LAB;  Service: Cardiovascular;;  left arm AV fistula  . PERIPHERAL VASCULAR CATHETERIZATION N/A 12/25/2014   Procedure: Upper Extremity Venography;  Surgeon: Conrad Sussex, MD;  Location: [place] CV LAB;  Service: Cardiovascular;  Laterality: N/A;  . PERIPHERAL VASCULAR CATHETERIZATION Left 02/24/2016   Procedure: Fistulagram;  Surgeon: Waynetta Sandy,  MD;  Location: [place] CV LAB;  Service: Cardiovascular;  Laterality: Left;  . PERIPHERAL VASCULAR CATHETERIZATION Left 02/24/2016   Procedure: Peripheral Vascular Intervention;  Surgeon: Waynetta Sandy, MD;  Location: [place] CV LAB;  Service: Cardiovascular;  Laterality: Left;  AV FISTULA  . PERITONEAL CATHETER INSERTION    . PERITONEAL CATHETER REMOVAL    . REVISON OF ARTERIOVENOUS FISTULA Right 2/99/3716   Procedure: PLICATION / REVISION OF ARTERIOVENOUS FISTULA;  Surgeon: Angelia Mould, MD;  Location: [place];  Service: Vascular;  Laterality: Right;  . REVISON OF ARTERIOVENOUS FISTULA Left 09/01/2015   Procedure: SUPERFICIALIZATION OF LEFT ARM BRACHIOCEPHALIC ARTERIOVENOUS FISTULA;  Surgeon: Angelia Mould, MD;  Location: [place];  Service: Vascular;  Laterality: Left;  . REVISON OF ARTERIOVENOUS FISTULA Left 08/09/2016   Procedure: REVISION OF ARTERIAL ANASTOMOSIS OF ARTERIOVENOUS FISTULA;  Surgeon: Waynetta Sandy, MD;  Location: [place];  Service: Vascular;  Laterality: Left;  . SHOULDER ARTHROSCOPY Left ~ 2006   frozen  . THROMBECTOMY W/ EMBOLECTOMY Left 08/02/2018   Procedure: THROMBECTOMY ARTERIOVENOUS FISTULA;  Surgeon: Angelia Mould, MD;  Location: [place];  Service: Vascular;  Laterality: Left;  . TOE AMPUTATION Bilateral after 2005  . UMBILICAL HERNIA REPAIR  1990's  . UPPER EXTREMITY VENOGRAPHY Bilateral 10/16/2018   Procedure: UPPER EXTREMITY VENOGRAPHY;  Surgeon: Serafina Mitchell, MD;  Location: [place] CV LAB;  Service: Cardiovascular;  Laterality: Bilateral;  . VENOGRAM Right 07/28/2011   Procedure: VENOGRAM;  Surgeon: Conrad [place], MD;  Location: [facility] CATH LAB;  Service: Cardiovascular;  Laterality: Right;  . VITRECTOMY Bilateral      Current Meds  Medication Sig  . acetaminophen (TYLENOL) 500 MG tablet Take 1,000 mg by mouth every 6 (six) hours as needed (pain/headaches.).   [name] Kitchen allopurinol (ZYLOPRIM) 100 MG tablet Take 1 tablet (100  mg total) by mouth every other day. (Patient taking differently: Take 200 mg by mouth daily.)  . amLODipine (NORVASC) 5 MG tablet Take 5 mg by mouth daily.  [name] Kitchen aspirin EC 81 MG tablet Take 81 mg by mouth at bedtime.  [name] Kitchen atorvastatin (LIPITOR) 20 MG tablet Take 20 mg by mouth daily.  [name] Kitchen  B Complex-C-Folic Acid (DIALYVITE 563) 0.8 MG TABS Take 1 tablet by mouth daily.  . calcium acetate (PHOSLO) 667 MG capsule Take 2,001 mg by mouth 3 (three) times daily with meals.  . cetirizine (ZYRTEC) 10 MG tablet Take 10 mg by mouth daily.  . famotidine (PEPCID) 20 MG tablet Take 20 mg by mouth 2 (two) times daily.  . fluticasone (FLONASE) 50 MCG/ACT nasal spray Place 2 sprays into both nostrils daily as needed for allergies.   [name] Kitchen gabapentin (NEURONTIN) 300 MG capsule Take 1 capsule (300 mg total) by mouth 2 (two) times daily as needed. (Patient taking differently: Take 300 mg by mouth 2 (two) times daily as needed (nerve pain).)  . GLUCAGON EMERGENCY 1 MG injection Inject 1 mg into the muscle daily as needed (low blood sugar).  . Glucose-Cholecalciferol (RELION GLUCOSE SHOT) LIQD Take 1 Dose by mouth daily as needed (low blood sugar).  [name] Kitchen HYDROmorphone (DILAUDID) 2 MG tablet Take 1 tablet (2 mg total) by mouth daily as needed for severe pain.  [name] Kitchen insulin lispro (HUMALOG) 100 UNIT/ML injection Inject 75-120 Units into the skin continuous. Per insulin pump  . levothyroxine (SYNTHROID, LEVOTHROID) 112 MCG tablet Take 112 mcg by mouth at bedtime.   [name] Kitchen loperamide (IMODIUM A-D) 2 MG tablet Take 2-4 mg by mouth 4 (four) times daily as needed for diarrhea or loose stools.  . Melatonin 5 MG TABS Take 5 mg by mouth at bedtime.  . nitroGLYCERIN (NITROSTAT) 0.4 MG SL tablet Place 0.4 mg under the tongue every 5 (five) minutes as needed for chest pain.  Vladimir Faster Glycol-Propyl Glycol (SYSTANE ULTRA PF OP) Place 1 drop into both eyes 4 (four) times daily as needed (dry eyes).  Vladimir Faster Glycol-Propyl Glycol 0.4-0.3 % SOLN Place  1 drop into both eyes 2 (two) times daily.  . polyethylene glycol (MIRALAX / GLYCOLAX) 17 g packet Take 17 g by mouth daily as needed.  . prasugrel (EFFIENT) 10 MG TABS tablet Take 1 tablet (10 mg total) by mouth daily. Please keep upcoming appt in January 2022 before anymore refills. Thank you  . promethazine (PHENERGAN) 25 MG tablet Take 25 mg by mouth every 6 (six) hours as needed for nausea or vomiting.  [name] Kitchen rOPINIRole (REQUIP) 2 MG tablet Take 2 mg by mouth every evening.  . sevelamer carbonate (RENVELA) 800 MG tablet Take 1,600 mg by mouth 3 (three) times daily with meals.  [name] Kitchen testosterone cypionate (DEPOTESTOSTERONE CYPIONATE) 200 MG/ML injection Inject 200 mg into the muscle every 14 (fourteen) days.   [name] Kitchen triamcinolone (KENALOG) 0.1 % Apply 1 application topically 2 (two) times daily.  . [DISCONTINUED] midodrine (PROAMATINE) 10 MG tablet Take 1 tablet (10 mg total) by mouth 3 (three) times daily. Takes 1 tab in mornings of dialysis and takes 2 tabs 30 mins before starting dialysis (m,w,f)  . [DISCONTINUED] Molnupiravir 200 MG CAPS TAKE 4 CAPSULES (800 MG TOTAL) BY MOUTH 2 (TWO) TIMES DAILY FOR 5 DAYS.     Allergies:   Iodinated diagnostic agents, Prednisone, Adhesive [tape], Amoxicillin, Clindamycin/lincomycin, Enalapril, Mobic [meloxicam], Morphine, Brilinta [ticagrelor], Ciprofloxacin, Clopidogrel, Codeine, Doxycycline, Hydrocodone, Levofloxacin, Oxycodone, and Rocephin [ceftriaxone]   Social History   Tobacco Use  . Smoking status: Never Smoker  . Smokeless tobacco: Never Used  Vaping Use  . Vaping Use: Never used  Substance Use Topics  . Alcohol use: Yes    Comment: rare  . Drug use: No     Family Hx: [name] family history includes Bone cancer  in his maternal aunt; Breast cancer in his maternal aunt; Colon polyps in his father; Diabetes in his father and mother; Hyperlipidemia in his mother; Hypertension in his father and mother; Liver cancer in his maternal aunt. There is  no history of Malignant hyperthermia, Heart attack, Stroke, Colon cancer, Stomach cancer, Pancreatic cancer, or Esophageal cancer.  ROS:   Please see the history of present illness.    All other systems reviewed and are negative.   Prior CV studies:   The following studies were reviewed today:  2d echo 10/2016 - Left ventricle: The cavity size was normal. Wall thickness was  increased in a pattern of moderate LVH. Systolic function was  normal. The estimated ejection fraction was in the range of 60%  to 65%. Wall motion was normal; there were no regional wall  motion abnormalities. Doppler parameters are consistent with  abnormal left ventricular relaxation (grade 1 diastolic  dysfunction). The E/e&' ratio is >15, suggesting elevated LV  filling pressure.  - Mitral valve: Calcified annulus. Mildly thickened leaflets .  There was trivial regurgitation.  - Left atrium: The atrium was normal in size.  - Tricuspid valve: There was mild regurgitation.  - Pulmonary arteries: PA peak pressure: 39 mm Hg (S).  - Inferior vena cava: The vessel was normal in size. The  respirophasic diameter changes were in the normal range (>= 50%),  consistent with normal central venous pressure.   Impressions:   - LVEF 60-65%, moderate LVH, normal wall motion, grade 1 DD with  elevated LV filling pressure, trivial MR, mild TR, RVSP 39 mmHg,  normal IVC.   LHC 12/2015  Ost LAD lesion, 30 %stenosed.  Dist LAD lesion, 30 %stenosed.  Prox RCA to Mid RCA lesion, 0 %stenosed.  1st RPLB lesion, 50 %stenosed.  A STENT XIENCE ALPINE RX [id number] drug eluting stent was successfully placed.  1st Diag lesion, 95 %stenosed.  Post intervention, there is a 0% residual stenosis.  Dist RCA lesion, 30 %stenosed.   Two-vessel coronary obstructive disease with mild calcification of the proximal LAD with 30% narrowing, progressive 95% mid diagonal stenosis, 30% mid LAD narrowing; normal  left circumflex coronary artery; and calcified large RCA with a widely patent stent in the mid RCA at the site of previous atherectomy, 30% acute margin narrowing, and 50% inferior branch PDA stenosis.  Successful PCI to the diagonal vessel with ultimate insertion of a 2.2515 mm Xience Alpine DES stent postdilated with a 2.4 mm with the 95% stenosis being reduced to 0%.  RECOMMENDATION: The patient will continue dual antiplatelet therapy with aspirin and Brilinta.  Medical therapy for concomitant CAD.      Labs/Other Tests and Data Reviewed:    EKG:  An ECG dated 05/20/20 was personally reviewed today and demonstrated:  NSR 86bpm, no acute STT changes  Recent Labs: 05/20/2020: B Natriuretic Peptide 233.8 05/21/2020: TSH 3.016 05/25/2020: ALT 116; BUN 50; Creatinine, Ser 12.39; Hemoglobin 8.5; Platelets 276; Potassium 4.5; Sodium 134   Recent Lipid Panel Lab Results  Component Value Date/Time   CHOL 116 05/21/2020 09:45 AM   TRIG 118 05/21/2020 09:45 AM   HDL 32 (L) 05/21/2020 09:45 AM   CHOLHDL 3.6 05/21/2020 09:45 AM   LDLCALC 60 05/21/2020 09:45 AM    Wt Readings from Last 3 Encounters:  07/29/20 247 lb (112 kg)  07/24/20 251 lb 5.2 oz (114 kg)  07/14/20 266 lb 9.6 oz (120.9 kg)     Objective:    Vital Signs:  BP [name] Kitchen)  154/74   Pulse 78   Ht _0  (1.727 m)   Wt 247 lb (112 kg)   BMI 37.56 kg/m    VS reviewed. General - male in no acute distress HEENT - NCAT, EOM intact Pulm - No labored breathing, no coughing during visit, no audible wheezing, speaking in full sentences Neuro - A+Ox3, no slurred speech, answers questions appropriately Psych - Pleasant affect  ASSESSMENT & PLAN:    1. Acute on chronic HFpEF in the setting of ESRD on HD - this has been a smoldering issue for him the last several months and prompted admission in February for extra dialysis. His echo was largely reassuring with normal EF and mild LVH, but moderate pulmonary HTN. He also reports that  since that time his nephrology team has been adjusting his dry weight and challenging with additional removal at each session and he is feeling much better. His weight is down 19lb since March, and by his report 42lb since his illnesses in the winter. Since volume is managed by dialysis, there is no role here for diuretic therapy. He is noted to be hypertensive - see below.  2. CAD - he has been managed on chronic ASA + Effient by his primary cardiologist. No changes made today. No angina reported. Would not escalate statin at this time as he previously reported h/o abnormal LFTs. Data in HD patients is also mixed.  3. Moderate pulmonary HTN - suspect multifactorial in setting of volume overload at the time of echocardiogram along with his pulmonary disease, recent Covid-19, and severe OSA. He states today he is feeling much better with additional volume removal. Recommend to reassess timing of f/u echocardiogram at follow-up visit.  4. Essential HTN - he came off midodrine about a month ago per his report and required initiation of amlodipine. He no longer drops his BP at HD per his report. He reports SBP 150s-160s before dialysis, 140/70 roughly after dialysis. In-between days not clear. His HTN may be worsening his tendency to retain. Also discussed dietary sodium reduction. Have asked nurse to put in a call to Dr. Augustin Coupe (nephrology) office to get his input if patient is eligible for increase in amlodipine to 86m daily. Their team sees him weekly at dialysis and has a better sense for his routine fluctuations.   Time:   Today, I have spent 19 minutes with the patient with telehealth technology discussing the above problems.     Medication Adjustments/Labs and Tests Ordered: Current medicines are reviewed at length with the patient today.  Testing and concerns regarding medicines are outlined above.    Follow Up:  Dr. MAngelena Form me or SRichardson Doppin 2 months in person to  follow-up.  Signed, DCharlie Pitter PA-C  07/29/2020 11:24 AM    [place]

## 2020-07-28 NOTE — Progress Notes (Signed)
LMTCB for the pt 

## 2020-07-29 ENCOUNTER — Telehealth (INDEPENDENT_AMBULATORY_CARE_PROVIDER_SITE_OTHER): Payer: Medicare Other | Admitting: Physician Assistant

## 2020-07-29 ENCOUNTER — Telehealth: Payer: Self-pay | Admitting: *Deleted

## 2020-07-29 ENCOUNTER — Encounter: Payer: Self-pay | Admitting: Physician Assistant

## 2020-07-29 ENCOUNTER — Other Ambulatory Visit: Payer: Self-pay

## 2020-07-29 VITALS — BP 154/74 | HR 78 | Ht 68.0 in | Wt 247.0 lb

## 2020-07-29 DIAGNOSIS — N186 End stage renal disease: Secondary | ICD-10-CM

## 2020-07-29 DIAGNOSIS — D631 Anemia in chronic kidney disease: Secondary | ICD-10-CM | POA: Diagnosis not present

## 2020-07-29 DIAGNOSIS — I5033 Acute on chronic diastolic (congestive) heart failure: Secondary | ICD-10-CM | POA: Diagnosis not present

## 2020-07-29 DIAGNOSIS — I2510 Atherosclerotic heart disease of native coronary artery without angina pectoris: Secondary | ICD-10-CM

## 2020-07-29 DIAGNOSIS — I272 Pulmonary hypertension, unspecified: Secondary | ICD-10-CM

## 2020-07-29 DIAGNOSIS — N2581 Secondary hyperparathyroidism of renal origin: Secondary | ICD-10-CM | POA: Diagnosis not present

## 2020-07-29 DIAGNOSIS — I10 Essential (primary) hypertension: Secondary | ICD-10-CM

## 2020-07-29 DIAGNOSIS — L299 Pruritus, unspecified: Secondary | ICD-10-CM | POA: Diagnosis not present

## 2020-07-29 DIAGNOSIS — T82818A Embolism of vascular prosthetic devices, implants and grafts, initial encounter: Secondary | ICD-10-CM | POA: Diagnosis not present

## 2020-07-29 DIAGNOSIS — Z992 Dependence on renal dialysis: Secondary | ICD-10-CM | POA: Diagnosis not present

## 2020-07-29 DIAGNOSIS — D689 Coagulation defect, unspecified: Secondary | ICD-10-CM | POA: Diagnosis not present

## 2020-07-29 NOTE — Telephone Encounter (Signed)
OK. In that case since they are continuing to work on fluid removal, let's hold off and see where he settles out. I do not want him to swing too low of blood pressure which then prohibits the fluid removal. Thanks. Please let the renal team and the patient know we are keeping dose the same. Veto Macqueen PA-C

## 2020-07-29 NOTE — Telephone Encounter (Signed)
-----   Message from Charlie Pitter, [place] sent at 07/29/2020 12:18 PM EDT ----- Regarding: RE: Orders 4/6 It's not urgent - we'll just await the call whenever they call back. ----- Message ----- From: Jeanann Lewandowsky, RMA Sent: 07/29/2020  12:09 PM EDT To: Charlie Pitter, PA-C Subject: RE: Orders 4/6                                 Had to call Dialysis Center in [facility].. There is no provider there at the moment. Charge Nurse cannot make that call.. They took the # and will have a provider call back if they come back this afternoon..  So not sure when we will get a call back..   I am off tomorrow..  So what to do? ----- Message ----- From: Felipa Evener Sent: 07/29/2020  11:49 AM EDT To: Jeanann Lewandowsky, RMA Subject: Orders 4/6                                     Can you please put a call into [place] to ask Dr. Augustin Coupe to review whether he is OK with [place] increasing amlodipine to '10mg'$  daily? His blood pressure is followed closely at dialysis - has a history of low blood pressure dropping and was previously on midodrine, but BP has been up recently. He tolerated the addition of amlodipine '5mg'$  daily but still has frequent BP in the 150-160s range. Just want to make sure this is a collaborative decision with their involvement since they see him in person at dialysis frequently.  2 month f/u in person with [place], me or Nicki Reaper (has seen before)

## 2020-07-29 NOTE — Telephone Encounter (Signed)
Shirlee Limerick, PA-C, called back regarding pt.  She said you could try it, but she thinks he is fluid overload and they are working on that.  She is concerned that by the time his dialysis is over, his bp is ok.  She said you can do a trial, but she is concerned hypotensive, but can give it a try.  She said his bp are up when he comes in, but after his treatment, they are much lower.

## 2020-07-29 NOTE — Telephone Encounter (Signed)
Contacted Dr. Aron Baba office, regarding message below.  The charge nurse will have a provider call us back once they come back to the office.

## 2020-07-29 NOTE — Telephone Encounter (Signed)
Horton Chin, PA-C, with [facility], left her a detailed message that we will keep pt's Amlodipine at current dose of 5 mg for now since they are continuing to work on fluid removal and see where his bp settles at.  Advised that she can call me back if she has any questions.

## 2020-07-29 NOTE — Telephone Encounter (Signed)
  Patient Consent for Virtual Visit         Dale [name] has provided verbal consent on 07/29/2020 for a virtual visit (video or telephone).   CONSENT FOR VIRTUAL VISIT FOR:  Dale [name]  By participating in this virtual visit I agree to the following:  I hereby voluntarily request, consent and authorize [facility] and its employed or contracted physicians, physician assistants, nurse practitioners or other licensed health care professionals (the Practitioner), to provide me with telemedicine health care services (the "Services") as deemed necessary by the treating Practitioner. I acknowledge and consent to receive the Services by the Practitioner via telemedicine. I understand that the telemedicine visit will involve communicating with the Practitioner through live audiovisual communication technology and the disclosure of certain medical information by electronic transmission. I acknowledge that I have been given the opportunity to request an in-person assessment or other available alternative prior to the telemedicine visit and am voluntarily participating in the telemedicine visit.  I understand that I have the right to withhold or withdraw my consent to the use of telemedicine in the course of my care at any time, without affecting my right to future care or treatment, and that the Practitioner or I may terminate the telemedicine visit at any time. I understand that I have the right to inspect all information obtained and/or recorded in the course of the telemedicine visit and may receive copies of available information for a reasonable fee.  I understand that some of the potential risks of receiving the Services via telemedicine include:  [name] Kitchen Delay or interruption in medical evaluation due to technological equipment failure or disruption; . Information transmitted may not be sufficient (e.g. poor resolution of images) to allow for appropriate medical decision making by the Practitioner;  and/or  . In rare instances, security protocols could fail, causing a breach of personal health information.  Furthermore, I acknowledge that it is my responsibility to provide information about my medical history, conditions and care that is complete and accurate to the best of my ability. I acknowledge that Practitioner's advice, recommendations, and/or decision may be based on factors not within their control, such as incomplete or inaccurate data provided by me or distortions of diagnostic images or specimens that may result from electronic transmissions. I understand that the practice of medicine is not an exact science and that Practitioner makes no warranties or guarantees regarding treatment outcomes. I acknowledge that a copy of this consent can be made available to me via my patient portal ([facility]), or I can request a printed copy by calling the office of Dale [name].    I understand that my insurance will be billed for this visit.   I have read or had this consent read to me. . I understand the contents of this consent, which adequately explains the benefits and risks of the Services being provided via telemedicine.  . I have been provided ample opportunity to ask questions regarding this consent and the Services and have had my questions answered to my satisfaction. . I give my informed consent for the services to be provided through the use of telemedicine in my medical care

## 2020-07-29 NOTE — Telephone Encounter (Signed)
Dale [name] from [facility] was returning a call for [employer]. Please call back. Her cell phone number has been provided

## 2020-07-29 NOTE — Patient Instructions (Signed)
Medication Instructions:  Your physician recommends that you continue on your current medications as directed. Please refer to the Current Medication list given to you today.  *If you need a refill on your cardiac medications before your next appointment, please call your pharmacy*   Lab Work: None ordered  If you have labs (blood work) drawn today and your tests are completely normal, you will receive your results only by: [name] Kitchen MyChart Message (if you have MyChart) OR . A paper copy in the mail If you have any lab test that is abnormal or we need to change your treatment, we will call you to review the results.   Testing/Procedures: None ordered   Follow-Up: At [facility], you and your health needs are our priority.  As part of our continuing mission to provide you with exceptional heart care, we have created designated Provider Care Teams.  These Care Teams include your primary Cardiologist (physician) and Advanced Practice Providers (APPs -  Physician Assistants and Nurse Practitioners) who all work together to provide you with the care you need, when you need it.  We recommend signing up for the patient portal called "MyChart".  Sign up information is provided on this After Visit Summary.  MyChart is used to connect with patients for Virtual Visits (Telemedicine).  Patients are able to view lab/test results, encounter notes, upcoming appointments, etc.  Non-urgent messages can be sent to your provider as well.   To learn more about what you can do with MyChart, go to [email or web address].    Your next appointment:   1 month(s)  The format for your next appointment:   In Person  Provider:   You may see Lauree Chandler, MD or one of the following Advanced Practice Providers on your designated Care Team:    Melina Copa, PA-C  Ermalinda Barrios, PA-C    Other Instructions

## 2020-07-31 DIAGNOSIS — D689 Coagulation defect, unspecified: Secondary | ICD-10-CM | POA: Diagnosis not present

## 2020-07-31 DIAGNOSIS — D631 Anemia in chronic kidney disease: Secondary | ICD-10-CM | POA: Diagnosis not present

## 2020-07-31 DIAGNOSIS — L299 Pruritus, unspecified: Secondary | ICD-10-CM | POA: Diagnosis not present

## 2020-07-31 DIAGNOSIS — N2581 Secondary hyperparathyroidism of renal origin: Secondary | ICD-10-CM | POA: Diagnosis not present

## 2020-07-31 DIAGNOSIS — N186 End stage renal disease: Secondary | ICD-10-CM | POA: Diagnosis not present

## 2020-07-31 DIAGNOSIS — Z992 Dependence on renal dialysis: Secondary | ICD-10-CM | POA: Diagnosis not present

## 2020-07-31 DIAGNOSIS — T82818A Embolism of vascular prosthetic devices, implants and grafts, initial encounter: Secondary | ICD-10-CM | POA: Diagnosis not present

## 2020-08-03 DIAGNOSIS — Z992 Dependence on renal dialysis: Secondary | ICD-10-CM | POA: Diagnosis not present

## 2020-08-03 DIAGNOSIS — T82818A Embolism of vascular prosthetic devices, implants and grafts, initial encounter: Secondary | ICD-10-CM | POA: Diagnosis not present

## 2020-08-03 DIAGNOSIS — N186 End stage renal disease: Secondary | ICD-10-CM | POA: Diagnosis not present

## 2020-08-03 DIAGNOSIS — N2581 Secondary hyperparathyroidism of renal origin: Secondary | ICD-10-CM | POA: Diagnosis not present

## 2020-08-03 DIAGNOSIS — D631 Anemia in chronic kidney disease: Secondary | ICD-10-CM | POA: Diagnosis not present

## 2020-08-03 DIAGNOSIS — D689 Coagulation defect, unspecified: Secondary | ICD-10-CM | POA: Diagnosis not present

## 2020-08-04 DIAGNOSIS — M2550 Pain in unspecified joint: Secondary | ICD-10-CM | POA: Diagnosis not present

## 2020-08-04 DIAGNOSIS — Z79899 Other long term (current) drug therapy: Secondary | ICD-10-CM | POA: Diagnosis not present

## 2020-08-05 DIAGNOSIS — N186 End stage renal disease: Secondary | ICD-10-CM | POA: Diagnosis not present

## 2020-08-05 DIAGNOSIS — D631 Anemia in chronic kidney disease: Secondary | ICD-10-CM | POA: Diagnosis not present

## 2020-08-05 DIAGNOSIS — N2581 Secondary hyperparathyroidism of renal origin: Secondary | ICD-10-CM | POA: Diagnosis not present

## 2020-08-05 DIAGNOSIS — Z992 Dependence on renal dialysis: Secondary | ICD-10-CM | POA: Diagnosis not present

## 2020-08-05 DIAGNOSIS — D689 Coagulation defect, unspecified: Secondary | ICD-10-CM | POA: Diagnosis not present

## 2020-08-05 DIAGNOSIS — T82818A Embolism of vascular prosthetic devices, implants and grafts, initial encounter: Secondary | ICD-10-CM | POA: Diagnosis not present

## 2020-08-07 DIAGNOSIS — D631 Anemia in chronic kidney disease: Secondary | ICD-10-CM | POA: Diagnosis not present

## 2020-08-07 DIAGNOSIS — T82818A Embolism of vascular prosthetic devices, implants and grafts, initial encounter: Secondary | ICD-10-CM | POA: Diagnosis not present

## 2020-08-07 DIAGNOSIS — N2581 Secondary hyperparathyroidism of renal origin: Secondary | ICD-10-CM | POA: Diagnosis not present

## 2020-08-07 DIAGNOSIS — N186 End stage renal disease: Secondary | ICD-10-CM | POA: Diagnosis not present

## 2020-08-07 DIAGNOSIS — D689 Coagulation defect, unspecified: Secondary | ICD-10-CM | POA: Diagnosis not present

## 2020-08-07 DIAGNOSIS — Z992 Dependence on renal dialysis: Secondary | ICD-10-CM | POA: Diagnosis not present

## 2020-08-10 DIAGNOSIS — E1022 Type 1 diabetes mellitus with diabetic chronic kidney disease: Secondary | ICD-10-CM | POA: Diagnosis not present

## 2020-08-10 DIAGNOSIS — N186 End stage renal disease: Secondary | ICD-10-CM | POA: Diagnosis not present

## 2020-08-10 DIAGNOSIS — N2581 Secondary hyperparathyroidism of renal origin: Secondary | ICD-10-CM | POA: Diagnosis not present

## 2020-08-10 DIAGNOSIS — Z992 Dependence on renal dialysis: Secondary | ICD-10-CM | POA: Diagnosis not present

## 2020-08-10 DIAGNOSIS — D509 Iron deficiency anemia, unspecified: Secondary | ICD-10-CM | POA: Diagnosis not present

## 2020-08-10 DIAGNOSIS — D689 Coagulation defect, unspecified: Secondary | ICD-10-CM | POA: Diagnosis not present

## 2020-08-10 DIAGNOSIS — D631 Anemia in chronic kidney disease: Secondary | ICD-10-CM | POA: Diagnosis not present

## 2020-08-10 DIAGNOSIS — T82818A Embolism of vascular prosthetic devices, implants and grafts, initial encounter: Secondary | ICD-10-CM | POA: Diagnosis not present

## 2020-08-12 DIAGNOSIS — D509 Iron deficiency anemia, unspecified: Secondary | ICD-10-CM | POA: Diagnosis not present

## 2020-08-12 DIAGNOSIS — E1022 Type 1 diabetes mellitus with diabetic chronic kidney disease: Secondary | ICD-10-CM | POA: Diagnosis not present

## 2020-08-12 DIAGNOSIS — N2581 Secondary hyperparathyroidism of renal origin: Secondary | ICD-10-CM | POA: Diagnosis not present

## 2020-08-12 DIAGNOSIS — N186 End stage renal disease: Secondary | ICD-10-CM | POA: Diagnosis not present

## 2020-08-12 DIAGNOSIS — T82818A Embolism of vascular prosthetic devices, implants and grafts, initial encounter: Secondary | ICD-10-CM | POA: Diagnosis not present

## 2020-08-12 DIAGNOSIS — Z992 Dependence on renal dialysis: Secondary | ICD-10-CM | POA: Diagnosis not present

## 2020-08-12 DIAGNOSIS — D631 Anemia in chronic kidney disease: Secondary | ICD-10-CM | POA: Diagnosis not present

## 2020-08-12 DIAGNOSIS — D689 Coagulation defect, unspecified: Secondary | ICD-10-CM | POA: Diagnosis not present

## 2020-08-13 DIAGNOSIS — M1711 Unilateral primary osteoarthritis, right knee: Secondary | ICD-10-CM | POA: Diagnosis not present

## 2020-08-13 NOTE — Progress Notes (Signed)
Spoke with pt and notified of results per Dr. Wert. Pt verbalized understanding and denied any questions. 

## 2020-08-14 DIAGNOSIS — D631 Anemia in chronic kidney disease: Secondary | ICD-10-CM | POA: Diagnosis not present

## 2020-08-14 DIAGNOSIS — Z992 Dependence on renal dialysis: Secondary | ICD-10-CM | POA: Diagnosis not present

## 2020-08-14 DIAGNOSIS — N2581 Secondary hyperparathyroidism of renal origin: Secondary | ICD-10-CM | POA: Diagnosis not present

## 2020-08-14 DIAGNOSIS — E1022 Type 1 diabetes mellitus with diabetic chronic kidney disease: Secondary | ICD-10-CM | POA: Diagnosis not present

## 2020-08-14 DIAGNOSIS — T82818A Embolism of vascular prosthetic devices, implants and grafts, initial encounter: Secondary | ICD-10-CM | POA: Diagnosis not present

## 2020-08-14 DIAGNOSIS — N186 End stage renal disease: Secondary | ICD-10-CM | POA: Diagnosis not present

## 2020-08-14 DIAGNOSIS — D689 Coagulation defect, unspecified: Secondary | ICD-10-CM | POA: Diagnosis not present

## 2020-08-14 DIAGNOSIS — D509 Iron deficiency anemia, unspecified: Secondary | ICD-10-CM | POA: Diagnosis not present

## 2020-08-17 DIAGNOSIS — T82818A Embolism of vascular prosthetic devices, implants and grafts, initial encounter: Secondary | ICD-10-CM | POA: Diagnosis not present

## 2020-08-17 DIAGNOSIS — D689 Coagulation defect, unspecified: Secondary | ICD-10-CM | POA: Diagnosis not present

## 2020-08-17 DIAGNOSIS — N2581 Secondary hyperparathyroidism of renal origin: Secondary | ICD-10-CM | POA: Diagnosis not present

## 2020-08-17 DIAGNOSIS — N186 End stage renal disease: Secondary | ICD-10-CM | POA: Diagnosis not present

## 2020-08-17 DIAGNOSIS — D509 Iron deficiency anemia, unspecified: Secondary | ICD-10-CM | POA: Diagnosis not present

## 2020-08-17 DIAGNOSIS — Z992 Dependence on renal dialysis: Secondary | ICD-10-CM | POA: Diagnosis not present

## 2020-08-17 DIAGNOSIS — D631 Anemia in chronic kidney disease: Secondary | ICD-10-CM | POA: Diagnosis not present

## 2020-08-18 ENCOUNTER — Ambulatory Visit (INDEPENDENT_AMBULATORY_CARE_PROVIDER_SITE_OTHER): Payer: Medicare Other

## 2020-08-18 VITALS — BP 174/82 | HR 88 | Ht 68.5 in | Wt 247.1 lb

## 2020-08-18 DIAGNOSIS — Z0000 Encounter for general adult medical examination without abnormal findings: Secondary | ICD-10-CM

## 2020-08-18 NOTE — Patient Instructions (Signed)
You spoke to Dale [name], [place] over the phone for your annual wellness visit.  We discussed goals: Goals    . HEMOGLOBIN A1C < 7      We also discussed recommended health maintenance. As discussed, you are due for the following. You have a scheduled PCP apt on 09/01/2020.  Health Maintenance  Topic Date Due  . FOOT EXAM  Never done  . OPHTHALMOLOGY EXAM  Never done  . URINE MICROALBUMIN  Never done  . TETANUS/TDAP  Never done  . COVID-19 Vaccine (4 - Booster for Moderna series) 10/29/2020  . HEMOGLOBIN A1C  11/18/2020  . INFLUENZA VACCINE  11/23/2020  . COLONOSCOPY (Pts 45-46yr Insurance coverage will need to be confirmed)  04/26/2026  . PNEUMOCOCCAL POLYSACCHARIDE VACCINE AGE 52-64 HIGH RISK  Completed  . Hepatitis C Screening  Completed  . HIV Screening  Completed  . HPV VACCINES  Aged Out   Bring a copy of your advance directive. PCP apt on 09/01/2020. You are eligible for #2 booster on 08/29/2020.   Health Maintenance, Male Adopting a healthy lifestyle and getting preventive care are important in promoting health and wellness. Ask your health care provider about:  The right schedule for you to have regular tests and exams.  Things you can do on your own to prevent diseases and keep yourself healthy. What should I know about diet, weight, and exercise? Eat a healthy diet  Eat a diet that includes plenty of vegetables, fruits, low-fat dairy products, and lean protein.  Do not eat a lot of foods that are high in solid fats, added sugars, or sodium.   Maintain a healthy weight Body mass index (BMI) is a measurement that can be used to identify possible weight problems. It estimates body fat based on height and weight. Your health care provider can help determine your BMI and help you achieve or maintain a healthy weight. Get regular exercise Get regular exercise. This is one of the most important things you can do for your health. Most adults should:  Exercise for at least  150 minutes each week. The exercise should increase your heart rate and make you sweat (moderate-intensity exercise).  Do strengthening exercises at least twice a week. This is in addition to the moderate-intensity exercise.  Spend less time sitting. Even light physical activity can be beneficial. Watch cholesterol and blood lipids Have your blood tested for lipids and cholesterol at 55years of age, then have this test every 5 years. You may need to have your cholesterol levels checked more often if:  Your lipid or cholesterol levels are high.  You are older than 55years of age.  You are at high risk for heart disease. What should I know about cancer screening? Many types of cancers can be detected early and may often be prevented. Depending on your health history and family history, you may need to have cancer screening at various ages. This may include screening for:  Colorectal cancer.  Prostate cancer.  Skin cancer.  Lung cancer. What should I know about heart disease, diabetes, and high blood pressure? Blood pressure and heart disease  High blood pressure causes heart disease and increases the risk of stroke. This is more likely to develop in people who have high blood pressure readings, are of African descent, or are overweight.  Talk with your health care provider about your target blood pressure readings.  Have your blood pressure checked: ? Every 3-5 years if you are 18-39 years of  age. ? Every year if you are 8 years old or older.  If you are between the ages of 70 and 65 and are a current or former smoker, ask your health care provider if you should have a one-time screening for abdominal aortic aneurysm (AAA). Diabetes Have regular diabetes screenings. This checks your fasting blood sugar level. Have the screening done:  Once every three years after age 30 if you are at a normal weight and have a low risk for diabetes.  More often and at a younger age if you  are overweight or have a high risk for diabetes. What should I know about preventing infection? Hepatitis B If you have a higher risk for hepatitis B, you should be screened for this virus. Talk with your health care provider to find out if you are at risk for hepatitis B infection. Hepatitis C Blood testing is recommended for:  Everyone born from [birth year] through 1965.  Anyone with known risk factors for hepatitis C. Sexually transmitted infections (STIs)  You should be screened each year for STIs, including gonorrhea and chlamydia, if: ? You are sexually active and are younger than 54 years of age. ? You are older than 55 years of age and your health care provider tells you that you are at risk for this type of infection. ? Your sexual activity has changed since you were last screened, and you are at increased risk for chlamydia or gonorrhea. Ask your health care provider if you are at risk.  Ask your health care provider about whether you are at high risk for HIV. Your health care provider may recommend a prescription medicine to help prevent HIV infection. If you choose to take medicine to prevent HIV, you should first get tested for HIV. You should then be tested every 3 months for as long as you are taking the medicine. Follow these instructions at home: Lifestyle  Do not use any products that contain nicotine or tobacco, such as cigarettes, e-cigarettes, and chewing tobacco. If you need help quitting, ask your health care provider.  Do not use street drugs.  Do not share needles.  Ask your health care provider for help if you need support or information about quitting drugs. Alcohol use  Do not drink alcohol if your health care provider tells you not to drink.  If you drink alcohol: ? Limit how much you have to 0-2 drinks a day. ? Be aware of how much alcohol is in your drink. In the [place] one drink equals one 12 oz bottle of beer (355 mL), one 5 oz glass of wine (148 mL), or one  1 oz glass of hard liquor (44 mL). General instructions  Schedule regular health, dental, and eye exams.  Stay current with your vaccines.  Tell your health care provider if: ? You often feel depressed. ? You have ever been abused or do not feel safe at home. Summary  Adopting a healthy lifestyle and getting preventive care are important in promoting health and wellness.  Follow your health care provider's instructions about healthy diet, exercising, and getting tested or screened for diseases.  Follow your health care provider's instructions on monitoring your cholesterol and blood pressure. This information is not intended to replace advice given to you by your health care provider. Make sure you discuss any questions you have with your health care provider. Document Revised: 04/04/2018 Document Reviewed: 04/04/2018 Elsevier Patient Education  [place].   Diet Recommendations for Diabetes  1. Eat at least 3 meals and 1-2 snacks per day. Never go more than 4-5 hours while awake without eating. Eat breakfast within the first hour of getting up.   2. Limit starchy foods to TWO per meal and ONE per snack. ONE portion of a starchy  food is equal to the following:   - ONE slice of bread (or its equivalent, such as half of a hamburger bun).   - 1/2 cup of a "scoopable" starchy food such as potatoes or rice.   - 15 grams of Total Carbohydrate as shown on food label.  3. Include at every meal: a protein food, a carb food, and vegetables and/or fruit.   - Obtain twice the volume of vegetables as protein or carbohydrate foods for both lunch and dinner.   - Fresh or frozen vegetables are best.   - Keep frozen vegetables on hand for a quick vegetable serving.      Starchy (carb) foods: Bread, rice, pasta, potatoes, corn, cereal, grits, crackers, bagels, muffins, all baked goods.  (Fruits, milk, and yogurt also have carbohydrate, but most of these foods will not spike your blood  sugar as most starchy foods will.)  A few fruits do cause high blood sugars; use small portions of bananas (limit to 1/2 at a time), grapes, watermelon, oranges, and most tropical fruits.    Protein foods: Meat, fish, poultry, eggs, dairy foods, and beans such as pinto and kidney beans (beans also provide carbohydrate).    Here is an example of what a healthy plate looks like:    ? Make half your plate fruits and vegetables.     ? Focus on whole fruits.     ? Vary your veggies.  ? Make half your grains whole grains. -     ? Look for the word "whole" at the beginning of the ingredients list    ? Some whole-grain ingredients include whole oats, whole-wheat flour,        whole-grain corn, whole-grain brown rice, and whole rye.  ? Move to low-fat and fat-free milk or yogurt.  ? Vary your protein routine. - Meat, fish, poultry (chicken, [place]), eggs, beans (kidney, pinto), dairy.  ? Drink and eat less sodium, saturated fat, and added sugars.  Our clinic's number is [phone]. Please call with questions or concerns about what we discussed today.

## 2020-08-18 NOTE — Progress Notes (Addendum)
Subjective:   Dale [name] is a 55 y.o. male who presents for Medicare Annual/Subsequent preventive examination.  The patient consented to a virtual visit. Patient consented to have virtual visit and was identified by name and date of birth. Method of visit: Telephone   Encounter participants: Patient: Dale [name] - located at Home Nurse/Provider: Dorna Bloom - located at [facility] Others (if applicable): NA  Total time: 45 minutes  Review of Systems: Defer to PCP  Cardiac Risk Factors include: diabetes mellitus;obesity (BMI >30kg/m2);sedentary lifestyle;male gender;hypertension  Objective:    Vitals: BP (!) 174/82   Pulse 88   Ht 5' 8.5" (1.74 m)   Wt 247 lb 2.2 oz (112.1 kg)   BMI 37.03 kg/m   Body mass index is 37.03 kg/m.  Advanced Directives 08/18/2020 05/08/2020 03/20/2020 02/19/2019 02/11/2019 12/17/2018 10/29/2018  Does Patient Have a Medical Advance Directive? Yes Yes No No;Yes Yes Yes Yes  Type of Paramedic of [place];Living will - - [place];Living will [place];Living will [place];Living will  Does patient want to make changes to medical advance directive? No - Patient declined No - Patient declined - - No - Patient declined No - Patient declined No - Patient declined  Copy of [place] in Chart? No - copy requested No - copy requested - - - - -  Would patient like information on creating a medical advance directive? - - No - Patient declined - - - -  Pre-existing out of facility DNR order (yellow form or pink MOST form) - - - - - - -   Tobacco Social History   Tobacco Use  Smoking Status Never Smoker  Smokeless Tobacco Never Used     Clinical Intake:  Pre-visit preparation completed: Yes  Pain Score: 0-No pain  How often do you need to have someone help you when you read instructions, pamphlets, or other written  materials from your doctor or pharmacy?: 1 - Never What is the last grade level you completed in school?: College  Interpreter Needed?: No  Past Medical History:  Diagnosis Date  . (HFpEF) heart failure with preserved ejection fraction ([place])    a. Echo 3/13:  EF 55-60%;  b. Echo (11/15):  Mild LVH, EF 60-65%, no RWMA, Gr 1 DD, MAC, mild LAE, normal RVF, mild RAE;  c.  Echo 5/16:  severe LVH, EF 55-60%, no RWMA, Gr 1 DD, MAC, mild LAE // Echo 7/18: EF 60-65, Gr 1 DD  . Anal fissure   . Anemia of chronic disease   . Arthritis    "hands, right knee" (03/19/2014) no cartlidge in right knee  . CAD (coronary artery disease)    a. Abnl nuc ->LHC (11/15):  mid to dist Dx 80%, mid RCA 99% (functional CTO), dist RCA 80% >> PCI: balloon angioplasty to mid RCA (could not deliver stent) - staged PCI Rotoblator atherectomy/DES to [facility]. b. NSTEMI 12/2015 s/p DES to diagonal.  . Cholecystitis    a. 08/27/2011  . Claustrophobia    when things get around his face.   . Complication of anesthesia   . Difficult intubation    only once in 2015 at [facility] haven't had a problem since then  . Diverticulosis   . Esophageal dysmotility   . ESRD (end stage renal disease) ([place])    a. 1995 s/p cadaveric transplant w/ susbequent failure after 18 yrs;  b.Dialysis initiated 07/2011 [facility]  . Fibromyalgia   . Gastroparesis   . GERD (gastroesophageal reflux disease)   . Gout    PMH  . History of blood transfusion   . History of COVID-19 04/2020   tx with sotrovimab (mAb) and molnupiravir (nucleoside analog)  . History of kidney stones    early 1990's  . HLD (hyperlipidemia)   . Hypertension   . Hypothyroidism   . Inguinal lymphadenopathy    a. bilateral - s/p biopsy 07/2011  . Insulin dependent diabetes mellitus    Type I  . Monilial esophagitis ([place])   . Morbid obesity ([place])   . OSA (obstructive sleep apnea)    adjustable bed and uses O2 L at bedtime  . PAD (peripheral artery disease) (HCC)     a. s/p L BKA.  [name] Kitchen Pericardial effusion    a.  Small by CT 08/22/11;  b.  Large by CT 08/27/11. c. Not seen on 11/2015 echo.  . Pneumonia ~ 2007  . PONV (postoperative nausea and vomiting)    vomited once after a procedure.  . T1DM (type 1 diabetes mellitus) ([place])   . Tracheobronchomalacia    Past Surgical History:  Procedure Laterality Date  . A/V FISTULAGRAM Left 07/26/2016   Procedure: A/V Fistulagram;  Surgeon: Waynetta Sandy, MD;  Location: [place] CV LAB;  Service: Cardiovascular;  Laterality: Left;  . A/V FISTULAGRAM N/A 07/11/2017   Procedure: A/V FISTULAGRAM - Left AV;  Surgeon: Waynetta Sandy, MD;  Location: [place] CV LAB;  Service: Cardiovascular;  Laterality: N/A;  . AMPUTATION OF REPLICATED TOES Right   . ANGIOPLASTY  04/09/2014   RCA   . APPENDECTOMY  ~ 2006  . ARTERIOVENOUS GRAFT PLACEMENT Left 1993?   forearm  . ARTERIOVENOUS GRAFT PLACEMENT Right 1993?   leg  . ARTERIOVENOUS GRAFT PLACEMENT Left    Thigh  . Arteriovenous Graft Removed Left    Thigh  . AV FISTULA PLACEMENT  07/29/2011   Procedure: ARTERIOVENOUS (AV) FISTULA CREATION;  Surgeon: Mal Misty, MD;  Location: [place];  Service: Vascular;  Laterality: Right;  Brachial cephalic  . AV FISTULA PLACEMENT Left 01/20/2015   Procedure: LIGATION OF RIGHT ARTERIOVENOUS FISTULA WITH EXCISION OF ANEURYSM;  Surgeon: Angelia Mould, MD;  Location: [place];  Service: Vascular;  Laterality: Left;  . AV FISTULA PLACEMENT Left 04/30/2015   Procedure: CREATION OF LEFT ARM BRACHIO-CEPHALIC ARTERIOVENOUS (AV) FISTULA ;  Surgeon: Angelia Mould, MD;  Location: [place];  Service: Vascular;  Laterality: Left;  . AV Graft PLACEMENT Left 1993?   "attempted one in my wrist; didn't take"  . BASCILIC VEIN TRANSPOSITION Left 11/01/2018   Procedure: FIRST STAGE BASILIC VEIN TRANSPOSITION  LEFT ARM;  Surgeon: Serafina Mitchell, MD;  Location: [place];  Service: Vascular;  Laterality: Left;  . BASCILIC VEIN  TRANSPOSITION Left 12/27/2018   Procedure: SECOND STAGE BASILIC VEIN TRANSPOSITION  LEFT ARM;  Surgeon: Serafina Mitchell, MD;  Location: [place];  Service: Vascular;  Laterality: Left;  . BELOW KNEE LEG AMPUTATION Left 2010  . CARDIAC CATHETERIZATION  03/19/2014  . CARDIAC CATHETERIZATION  03/19/2014   Procedure: CORONARY BALLOON ANGIOPLASTY;  Surgeon: Burnell Blanks, MD;  Location: [facility] CATH LAB;  Service: Cardiovascular;;  . CARDIAC CATHETERIZATION N/A 01/01/2016   Procedure: Left Heart Cath and Coronary Angiography;  Surgeon: [place] Sine, MD;  Location: [place] CV LAB;  Service: Cardiovascular;  Laterality: N/A;  . CARDIAC CATHETERIZATION  N/A 01/01/2016   Procedure: Coronary Stent Intervention;  Surgeon: [place] Sine, MD;  Location: [place] CV LAB;  Service: Cardiovascular;  Laterality: N/A;  . CARPAL TUNNEL RELEASE Bilateral after 2005  . CATARACT EXTRACTION W/ INTRAOCULAR LENS  IMPLANT, BILATERAL Bilateral 1990's  . COLONOSCOPY  08/29/2011   Procedure: COLONOSCOPY;  Surgeon: Jerene Bears, MD;  Location: [place];  Service: Gastroenterology;  Laterality: N/A;  . COLONOSCOPY WITH PROPOFOL N/A 04/26/2016   Procedure: COLONOSCOPY WITH PROPOFOL;  Surgeon: Jerene Bears, MD;  Location: [facility] ENDOSCOPY;  Service: Gastroenterology;  Laterality: N/A;  . CORONARY ANGIOPLASTY WITH STENT PLACEMENT  04/09/2014       ptca/des mid lad   . ESOPHAGOGASTRODUODENOSCOPY N/A 12/25/2015   Procedure: ESOPHAGOGASTRODUODENOSCOPY (EGD);  Surgeon: Mauri Pole, MD;  Location: [facility] ENDOSCOPY;  Service: Endoscopy;  Laterality: N/A;  bedside  . ESOPHAGOGASTRODUODENOSCOPY (EGD) WITH PROPOFOL N/A 04/26/2016   Procedure: ESOPHAGOGASTRODUODENOSCOPY (EGD) WITH PROPOFOL;  Surgeon: Jerene Bears, MD;  Location: [facility]OSCOPY;  Service: Gastroenterology;  Laterality: N/A;  . EYE SURGERY Bilateral    cataract  . FEMORAL ARTERY REPAIR  04/09/2014   ANGIOSEAL  . INSERTION OF DIALYSIS CATHETER  08/01/2011   Procedure:  INSERTION OF DIALYSIS CATHETER;  Surgeon: Rosetta Posner, MD;  Location: [place];  Service: Vascular;  Laterality: Right;  insertion of dialysis catheter on right internal jugular vein  . INSERTION OF DIALYSIS CATHETER Right 01/20/2015   Procedure: INSERTION OF DIALYSIS CATHETER - RIGHT INTERNAL JUGULAR ;  Surgeon: Angelia Mould, MD;  Location: [place];  Service: Vascular;  Laterality: Right;  . INSERTION OF DIALYSIS CATHETER Right 08/02/2018   Procedure: Insertion Of Dialysis Catheter;  Surgeon: Angelia Mould, MD;  Location: [facility] OR;  Service: Vascular;  Laterality: Right;  . KIDNEY TRANSPLANT  04/25/1993  . LEFT HEART CATHETERIZATION WITH CORONARY ANGIOGRAM N/A 03/19/2014   Procedure: LEFT HEART CATHETERIZATION WITH CORONARY ANGIOGRAM;  Surgeon: Burnell Blanks, MD;  Location: [facility] CATH LAB;  Service: Cardiovascular;  Laterality: N/A;  . LIGATION OF ARTERIOVENOUS  FISTULA Left 11/01/2018   Procedure: Ligation Of  Brachio Cephalic Arteriovenous  Fistula;  Surgeon: Serafina Mitchell, MD;  Location: [facility] OR;  Service: Vascular;  Laterality: Left;  . LIGATION OF ARTERIOVENOUS  FISTULA Left 02/19/2019   Procedure: LIGATION OF ARTERIOVENOUS  FISTULA LEFT ARM;  Surgeon: Angelia Mould, MD;  Location: [place];  Service: Vascular;  Laterality: Left;  . LYMPH NODE BIOPSY  08/10/2011   Procedure: LYMPH NODE BIOPSY;  Surgeon: Merrie Roof, MD;  Location: [place];  Service: General;  Laterality: Left;  left groin lymph biopsy  . NEUROPLASTY / TRANSPOSITION ULNAR NERVE AT ELBOW Left after 2005  . PATCH ANGIOPLASTY Left 08/09/2016   Procedure: LEFT  ARTERIOVENOUS FISTULA PATCH ANGIOPLASTY;  Surgeon: Waynetta Sandy, MD;  Location: [place];  Service: Vascular;  Laterality: Left;  . PERCUTANEOUS CORONARY ROTOBLATOR INTERVENTION (PCI-R) N/A 04/09/2014   Procedure: PERCUTANEOUS CORONARY ROTOBLATOR INTERVENTION (PCI-R);  Surgeon: Burnell Blanks, MD;  Location: [name] CATH LAB;  Service:  Cardiovascular;  Laterality: N/A;  . PERIPHERAL VASCULAR BALLOON ANGIOPLASTY  07/11/2017   Procedure: PERIPHERAL VASCULAR BALLOON ANGIOPLASTY;  Surgeon: Waynetta Sandy, MD;  Location: [place] CV LAB;  Service: Cardiovascular;;  left arm AV fistula  . PERIPHERAL VASCULAR CATHETERIZATION N/A 12/25/2014   Procedure: Upper Extremity Venography;  Surgeon: Conrad Dewy Rose, MD;  Location: [place] [facility] LAB;  Service: Cardiovascular;  Laterality: N/A;  .  PERIPHERAL VASCULAR CATHETERIZATION Left 02/24/2016   Procedure: Fistulagram;  Surgeon: Waynetta Sandy, MD;  Location: [place] CV LAB;  Service: Cardiovascular;  Laterality: Left;  . PERIPHERAL VASCULAR CATHETERIZATION Left 02/24/2016   Procedure: Peripheral Vascular Intervention;  Surgeon: Waynetta Sandy, MD;  Location: [place] CV LAB;  Service: Cardiovascular;  Laterality: Left;  AV FISTULA  . PERITONEAL CATHETER INSERTION    . PERITONEAL CATHETER REMOVAL    . REVISON OF ARTERIOVENOUS FISTULA Right 3/97/6734   Procedure: PLICATION / REVISION OF ARTERIOVENOUS FISTULA;  Surgeon: Angelia Mould, MD;  Location: [place];  Service: Vascular;  Laterality: Right;  . REVISON OF ARTERIOVENOUS FISTULA Left 09/01/2015   Procedure: SUPERFICIALIZATION OF LEFT ARM BRACHIOCEPHALIC ARTERIOVENOUS FISTULA;  Surgeon: Angelia Mould, MD;  Location: [place];  Service: Vascular;  Laterality: Left;  . REVISON OF ARTERIOVENOUS FISTULA Left 08/09/2016   Procedure: REVISION OF ARTERIAL ANASTOMOSIS OF ARTERIOVENOUS FISTULA;  Surgeon: Waynetta Sandy, MD;  Location: [place];  Service: Vascular;  Laterality: Left;  . SHOULDER ARTHROSCOPY Left ~ 2006   frozen  . THROMBECTOMY W/ EMBOLECTOMY Left 08/02/2018   Procedure: THROMBECTOMY ARTERIOVENOUS FISTULA;  Surgeon: Angelia Mould, MD;  Location: [place];  Service: Vascular;  Laterality: Left;  . TOE AMPUTATION Bilateral after 2005  . UMBILICAL HERNIA REPAIR  1990's  . UPPER  EXTREMITY VENOGRAPHY Bilateral 10/16/2018   Procedure: UPPER EXTREMITY VENOGRAPHY;  Surgeon: Serafina Mitchell, MD;  Location: [place] CV LAB;  Service: Cardiovascular;  Laterality: Bilateral;  . VENOGRAM Right 07/28/2011   Procedure: VENOGRAM;  Surgeon: Conrad Shidler, MD;  Location: [facility] CATH LAB;  Service: Cardiovascular;  Laterality: Right;  . VITRECTOMY Bilateral    Family History  Problem Relation Age of Onset  . Colon polyps Father   . Diabetes Father   . Hypertension Father   . Hypertension Mother   . Diabetes Mother   . Hyperlipidemia Mother   . Breast cancer Maternal Aunt   . Bone cancer Maternal Aunt   . Liver cancer Maternal Aunt   . Malignant hyperthermia Neg Hx   . Heart attack Neg Hx   . Stroke Neg Hx   . Colon cancer Neg Hx   . Stomach cancer Neg Hx   . Pancreatic cancer Neg Hx   . Esophageal cancer Neg Hx    Social History   Socioeconomic History  . Marital status: Married    Spouse name: Joelene Millin   . Number of children: 0  . Years of education: 12  . Highest education level: Not on file  Occupational History  . Occupation: Disabled  Tobacco Use  . Smoking status: Never Smoker  . Smokeless tobacco: Never Used  Vaping Use  . Vaping Use: Never used  Substance and Sexual Activity  . Alcohol use: Yes    Comment: rare  . Drug use: No  . Sexual activity: Not Currently  Other Topics Concern  . Not on file  Social History Narrative   Patient lives in [place] with his wife Joelene Millin.    Patient enjoys playing with his lab mix, doing puzzles, and collecting liters.    Patient is disabled and has an aide who helps with ADLs.   Social Determinants of Health   Financial Resource Strain: Low Risk   . Difficulty of Paying Living Expenses: Not hard at all  Food Insecurity: No Food Insecurity  . Worried About Charity fundraiser in the Last Year: Never true  . Ran Out  of Food in the Last Year: Never true  Transportation Needs: No Transportation Needs  . Lack  of Transportation (Medical): No  . Lack of Transportation (Non-Medical): No  Physical Activity: Inactive  . Days of Exercise per Week: 0 days  . Minutes of Exercise per Session: 0 min  Stress: No Stress Concern Present  . Feeling of Stress : Only a little  Social Connections: Moderately Integrated  . Frequency of Communication with Friends and Family: More than three times a week  . Frequency of Social Gatherings with Friends and Family: More than three times a week  . Attends Religious Services: 1 to 4 times per year  . Active Member of Clubs or Organizations: No  . Attends Archivist Meetings: Never  . Marital Status: Married   Outpatient Encounter Medications as of 08/18/2020  Medication Sig  . acetaminophen (TYLENOL) 500 MG tablet Take 1,000 mg by mouth every 6 (six) hours as needed (pain/headaches.).   [name] Kitchen allopurinol (ZYLOPRIM) 100 MG tablet Take 1 tablet (100 mg total) by mouth every other day. (Patient taking differently: Take 200 mg by mouth daily.)  . amLODipine (NORVASC) 5 MG tablet Take 5 mg by mouth daily.  [name] Kitchen aspirin EC 81 MG tablet Take 81 mg by mouth at bedtime.  [name] Kitchen atorvastatin (LIPITOR) 20 MG tablet Take 20 mg by mouth daily.  . B Complex-C-Folic Acid (DIALYVITE 326) 0.8 MG TABS Take 1 tablet by mouth daily.  . calcium acetate (PHOSLO) 667 MG capsule Take 2,001 mg by mouth 3 (three) times daily with meals.  . cetirizine (ZYRTEC) 10 MG tablet Take 10 mg by mouth daily.  . famotidine (PEPCID) 20 MG tablet Take 20 mg by mouth 2 (two) times daily.  . fluticasone (FLONASE) 50 MCG/ACT nasal spray Place 2 sprays into both nostrils daily as needed for allergies.   [name] Kitchen gabapentin (NEURONTIN) 300 MG capsule Take 1 capsule (300 mg total) by mouth 2 (two) times daily as needed. (Patient taking differently: Take 300 mg by mouth 2 (two) times daily as needed (nerve pain).)  . GLUCAGON EMERGENCY 1 MG injection Inject 1 mg into the muscle daily as needed (low blood sugar).  .  Glucose-Cholecalciferol (RELION GLUCOSE SHOT) LIQD Take 1 Dose by mouth daily as needed (low blood sugar).  [name] Kitchen HYDROmorphone (DILAUDID) 2 MG tablet Take 1 tablet (2 mg total) by mouth daily as needed for severe pain.  [name] Kitchen insulin lispro (HUMALOG) 100 UNIT/ML injection Inject 75-120 Units into the skin continuous. Per insulin pump  . levothyroxine (SYNTHROID, LEVOTHROID) 112 MCG tablet Take 112 mcg by mouth at bedtime.   [name] Kitchen loperamide (IMODIUM A-D) 2 MG tablet Take 2-4 mg by mouth 4 (four) times daily as needed for diarrhea or loose stools.  . Melatonin 5 MG TABS Take 5 mg by mouth at bedtime.  . nitroGLYCERIN (NITROSTAT) 0.4 MG SL tablet Place 0.4 mg under the tongue every 5 (five) minutes as needed for chest pain.  Vladimir Faster Glycol-Propyl Glycol (SYSTANE ULTRA PF OP) Place 1 drop into both eyes 4 (four) times daily as needed (dry eyes).  Vladimir Faster Glycol-Propyl Glycol 0.4-0.3 % SOLN Place 1 drop into both eyes 2 (two) times daily.  . polyethylene glycol (MIRALAX / GLYCOLAX) 17 g packet Take 17 g by mouth daily as needed.  . prasugrel (EFFIENT) 10 MG TABS tablet Take 1 tablet (10 mg total) by mouth daily. Please keep upcoming appt in January 2022 before anymore refills. Thank you  . promethazine (PHENERGAN) 25  MG tablet Take 25 mg by mouth every 6 (six) hours as needed for nausea or vomiting.  [name] Kitchen rOPINIRole (REQUIP) 2 MG tablet Take 2 mg by mouth every evening.  . sevelamer carbonate (RENVELA) 800 MG tablet Take 1,600 mg by mouth 3 (three) times daily with meals.  [name] Kitchen testosterone cypionate (DEPOTESTOSTERONE CYPIONATE) 200 MG/ML injection Inject 200 mg into the muscle every 14 (fourteen) days.   [name] Kitchen triamcinolone (KENALOG) 0.1 % Apply 1 application topically 2 (two) times daily.   No facility-administered encounter medications on file as of 08/18/2020.   Activities of Daily Living In your present state of health, do you have any difficulty performing the following activities: 08/18/2020  Hearing? N   Vision? N  Difficulty concentrating or making decisions? N  Walking or climbing stairs? Y  Dressing or bathing? Y  Doing errands, shopping? Y  Preparing Food and eating ? N  Using the Toilet? N  In the past six months, have you accidently leaked urine? N  Do you have problems with loss of bowel control? N  Managing your Medications? N  Managing your Finances? N  Housekeeping or managing your Housekeeping? Y  Some recent data might be hidden   Patient Care Team: Donney Dice, DO as PCP - General (Family Medicine) Burnell Blanks, MD as PCP - Cardiology (Cardiology) Burnell Blanks, MD as Consulting Physician (Cardiology) Sharmon Revere as Physician Assistant (Physician Assistant) Fleet Contras, MD as Consulting Physician (Nephrology) [place]    Assessment:   This is a routine wellness examination for [place].  Exercise Activities and Dietary recommendations Current Exercise Habits: The patient does not participate in regular exercise at present, Exercise limited by: orthopedic condition(s)  Goals    . HEMOGLOBIN A1C < 7      Fall Risk Fall Risk  08/18/2020 11/29/2016  Falls in the past year? 0 No   Is the patient's home free of loose throw rugs in walkways, pet beds, electrical cords, etc?   yes      Grab bars in the bathroom? yes      Handrails on the stairs?   yes      Adequate lighting?   yes  Patient rating of health (0-10): 7  Depression Screen PHQ 2/9 Scores 08/18/2020 08/18/2020 07/14/2020 07/27/2017  PHQ - 2 Score 0 0 0 0   Cognitive Function 6CIT Screen 08/18/2020  What Year? 0 points  What month? 0 points  What time? 0 points  Count back from 20 0 points  Months in reverse 0 points  Repeat phrase 0 points  Total Score 0   Immunization History  Administered Date(s) Administered  . Influenza Split 02/03/2014, 01/21/2016, 01/23/2018, 01/23/2019, 01/28/2019, 01/22/2020  . Influenza,inj,Quad PF,6+ Mos  01/21/2016  . Influenza-Unspecified 02/03/2014, 01/24/2017, 01/28/2019  . Moderna Sars-Covid-2 Vaccination 06/07/2019, 07/05/2019, 05/01/2020  . Pneumococcal Conjugate-13 03/01/2018  . Pneumococcal Polysaccharide-23 04/14/2016  . Zoster Recombinat (Shingrix) 12/07/2018   Screening Tests Health Maintenance  Topic Date Due  . FOOT EXAM  Never done  . OPHTHALMOLOGY EXAM  Never done  . URINE MICROALBUMIN  Never done  . TETANUS/TDAP  Never done  . COVID-19 Vaccine (4 - Booster for Moderna series) 10/29/2020  . HEMOGLOBIN A1C  11/18/2020  . INFLUENZA VACCINE  11/23/2020  . COLONOSCOPY (Pts 45-30yr Insurance coverage will need to be confirmed)  04/26/2026  . PNEUMOCOCCAL POLYSACCHARIDE VACCINE AGE 56-64 HIGH RISK  Completed  . Hepatitis C Screening  Completed  . HIV  Screening  Completed  . HPV VACCINES  Aged Out   Cancer Screenings: Lung: Low Dose CT Chest recommended if Age 60-80 years, 30 pack-year currently smoking OR have quit w/in 15years. Patient does not qualify. Colorectal: Due 2028  Additional Screenings: HIV Screening: Completed  Hepatitis C Screening: Completed   Plan:  Bring a copy of your advance directive. PCP apt on 09/01/2020. You are eligible for #2 booster on 08/29/2020.  I have personally reviewed and noted the following in the patient's chart:   . Medical and social history . Use of alcohol, tobacco or illicit drugs  . Current medications and supplements . Functional ability and status . Nutritional status . Physical activity . Advanced directives . List of other physicians . Hospitalizations, surgeries, and ER visits in previous 12 months . Vitals . Screenings to include cognitive, depression, and falls . Referrals and appointments  In addition, I have reviewed and discussed with patient certain preventive protocols, quality metrics, and best practice recommendations. A written personalized care plan for preventive services as well as general preventive  health recommendations were provided to patient.  This visit was conducted virtually in the setting of the [place] pandemic.    Dorna Bloom, [place]  08/18/2020    I have reviewed this visit and agree with the documentation.  Donney Dice, DO  I have reviewed this visit and agree with the documentation.  Dorris Singh, MD  Family Medicine Teaching Service

## 2020-08-19 ENCOUNTER — Telehealth: Payer: Self-pay

## 2020-08-19 DIAGNOSIS — D631 Anemia in chronic kidney disease: Secondary | ICD-10-CM | POA: Diagnosis not present

## 2020-08-19 DIAGNOSIS — N2581 Secondary hyperparathyroidism of renal origin: Secondary | ICD-10-CM | POA: Diagnosis not present

## 2020-08-19 DIAGNOSIS — D509 Iron deficiency anemia, unspecified: Secondary | ICD-10-CM | POA: Diagnosis not present

## 2020-08-19 DIAGNOSIS — Z992 Dependence on renal dialysis: Secondary | ICD-10-CM | POA: Diagnosis not present

## 2020-08-19 DIAGNOSIS — N186 End stage renal disease: Secondary | ICD-10-CM | POA: Diagnosis not present

## 2020-08-19 DIAGNOSIS — D689 Coagulation defect, unspecified: Secondary | ICD-10-CM | POA: Diagnosis not present

## 2020-08-19 DIAGNOSIS — T82818A Embolism of vascular prosthetic devices, implants and grafts, initial encounter: Secondary | ICD-10-CM | POA: Diagnosis not present

## 2020-08-19 NOTE — Telephone Encounter (Signed)
I did an annual wellness visit on patient and he reported he gave PCP some forms to fill out. Patient stated they were for the [facility] and he gave PCP forms during March visit. Do you have these or know anything about the forms?

## 2020-08-21 DIAGNOSIS — N186 End stage renal disease: Secondary | ICD-10-CM | POA: Diagnosis not present

## 2020-08-21 DIAGNOSIS — D689 Coagulation defect, unspecified: Secondary | ICD-10-CM | POA: Diagnosis not present

## 2020-08-21 DIAGNOSIS — Z992 Dependence on renal dialysis: Secondary | ICD-10-CM | POA: Diagnosis not present

## 2020-08-21 DIAGNOSIS — D631 Anemia in chronic kidney disease: Secondary | ICD-10-CM | POA: Diagnosis not present

## 2020-08-21 DIAGNOSIS — N2581 Secondary hyperparathyroidism of renal origin: Secondary | ICD-10-CM | POA: Diagnosis not present

## 2020-08-21 DIAGNOSIS — T82818A Embolism of vascular prosthetic devices, implants and grafts, initial encounter: Secondary | ICD-10-CM | POA: Diagnosis not present

## 2020-08-21 DIAGNOSIS — D509 Iron deficiency anemia, unspecified: Secondary | ICD-10-CM | POA: Diagnosis not present

## 2020-08-22 DIAGNOSIS — Z992 Dependence on renal dialysis: Secondary | ICD-10-CM | POA: Diagnosis not present

## 2020-08-22 DIAGNOSIS — T8612 Kidney transplant failure: Secondary | ICD-10-CM | POA: Diagnosis not present

## 2020-08-22 DIAGNOSIS — N186 End stage renal disease: Secondary | ICD-10-CM | POA: Diagnosis not present

## 2020-08-24 DIAGNOSIS — N186 End stage renal disease: Secondary | ICD-10-CM | POA: Diagnosis not present

## 2020-08-24 DIAGNOSIS — D689 Coagulation defect, unspecified: Secondary | ICD-10-CM | POA: Diagnosis not present

## 2020-08-24 DIAGNOSIS — Z992 Dependence on renal dialysis: Secondary | ICD-10-CM | POA: Diagnosis not present

## 2020-08-24 DIAGNOSIS — N2581 Secondary hyperparathyroidism of renal origin: Secondary | ICD-10-CM | POA: Diagnosis not present

## 2020-08-24 DIAGNOSIS — D509 Iron deficiency anemia, unspecified: Secondary | ICD-10-CM | POA: Diagnosis not present

## 2020-08-24 DIAGNOSIS — T82818A Embolism of vascular prosthetic devices, implants and grafts, initial encounter: Secondary | ICD-10-CM | POA: Diagnosis not present

## 2020-08-24 DIAGNOSIS — D631 Anemia in chronic kidney disease: Secondary | ICD-10-CM | POA: Diagnosis not present

## 2020-08-24 DIAGNOSIS — R52 Pain, unspecified: Secondary | ICD-10-CM | POA: Diagnosis not present

## 2020-08-25 DIAGNOSIS — E785 Hyperlipidemia, unspecified: Secondary | ICD-10-CM | POA: Diagnosis not present

## 2020-08-25 DIAGNOSIS — Z4681 Encounter for fitting and adjustment of insulin pump: Secondary | ICD-10-CM | POA: Diagnosis not present

## 2020-08-25 DIAGNOSIS — E1040 Type 1 diabetes mellitus with diabetic neuropathy, unspecified: Secondary | ICD-10-CM | POA: Diagnosis not present

## 2020-08-25 DIAGNOSIS — N186 End stage renal disease: Secondary | ICD-10-CM | POA: Diagnosis not present

## 2020-08-25 DIAGNOSIS — E1051 Type 1 diabetes mellitus with diabetic peripheral angiopathy without gangrene: Secondary | ICD-10-CM | POA: Diagnosis not present

## 2020-08-25 DIAGNOSIS — E1065 Type 1 diabetes mellitus with hyperglycemia: Secondary | ICD-10-CM | POA: Diagnosis not present

## 2020-08-25 DIAGNOSIS — E108 Type 1 diabetes mellitus with unspecified complications: Secondary | ICD-10-CM | POA: Diagnosis not present

## 2020-08-25 DIAGNOSIS — I152 Hypertension secondary to endocrine disorders: Secondary | ICD-10-CM | POA: Diagnosis not present

## 2020-08-25 DIAGNOSIS — Z9641 Presence of insulin pump (external) (internal): Secondary | ICD-10-CM | POA: Diagnosis not present

## 2020-08-25 DIAGNOSIS — E10649 Type 1 diabetes mellitus with hypoglycemia without coma: Secondary | ICD-10-CM | POA: Diagnosis not present

## 2020-08-25 DIAGNOSIS — Z992 Dependence on renal dialysis: Secondary | ICD-10-CM | POA: Diagnosis not present

## 2020-08-25 DIAGNOSIS — E1022 Type 1 diabetes mellitus with diabetic chronic kidney disease: Secondary | ICD-10-CM | POA: Diagnosis not present

## 2020-08-26 ENCOUNTER — Ambulatory Visit (INDEPENDENT_AMBULATORY_CARE_PROVIDER_SITE_OTHER): Payer: Medicare Other | Admitting: Student in an Organized Health Care Education/Training Program

## 2020-08-26 DIAGNOSIS — T82818A Embolism of vascular prosthetic devices, implants and grafts, initial encounter: Secondary | ICD-10-CM | POA: Diagnosis not present

## 2020-08-26 DIAGNOSIS — D631 Anemia in chronic kidney disease: Secondary | ICD-10-CM | POA: Diagnosis not present

## 2020-08-26 DIAGNOSIS — D509 Iron deficiency anemia, unspecified: Secondary | ICD-10-CM | POA: Diagnosis not present

## 2020-08-26 DIAGNOSIS — N186 End stage renal disease: Secondary | ICD-10-CM | POA: Diagnosis not present

## 2020-08-26 DIAGNOSIS — D689 Coagulation defect, unspecified: Secondary | ICD-10-CM | POA: Diagnosis not present

## 2020-08-26 DIAGNOSIS — J209 Acute bronchitis, unspecified: Secondary | ICD-10-CM

## 2020-08-26 DIAGNOSIS — N2581 Secondary hyperparathyroidism of renal origin: Secondary | ICD-10-CM | POA: Diagnosis not present

## 2020-08-26 DIAGNOSIS — R52 Pain, unspecified: Secondary | ICD-10-CM | POA: Diagnosis not present

## 2020-08-26 DIAGNOSIS — Z992 Dependence on renal dialysis: Secondary | ICD-10-CM | POA: Diagnosis not present

## 2020-08-26 MED ORDER — IPRATROPIUM BROMIDE 0.03 % NA SOLN: 2.0000 | Freq: Two times a day (BID) | NASAL | 12 refills | Status: DC

## 2020-08-26 NOTE — Progress Notes (Signed)
   SUBJECTIVE:   CHIEF COMPLAINT / HPI: cough  Cough- 1.5 weeks ago developed scratchy throat and progressed to productive coughing. worse at night. Used mucinex intermittently Denies SOB currently while on oxygen. But did have SOB prior to starting continuous oxygen a couple days ago (baseline nighttime 2L wearer). Wife measured oxygen at home around 80s with coughing but improved when not coughing. Couldn't complete dialysis on Monday due to coughing so much. Able to complete full dialysis today. No diarrhea, fever, vomiting.   OBJECTIVE:   BP (!) 142/85   Pulse 86   SpO2 99%   Physical Exam Vitals and nursing note reviewed. Exam conducted with a chaperone present.  Constitutional:      General: He is not in acute distress.    Appearance: He is obese. He is not ill-appearing, toxic-appearing or diaphoretic.  Pulmonary:     Effort: Pulmonary effort is normal. No respiratory distress or retractions.     Breath sounds: Decreased air movement present.     Comments: Patient had significant rales in upper lung fields that cleared completely with forced coughing. Did not appreciate any focal decreased breath sounds. Distally, poor air movement or possibly poor exam due to body habitus Musculoskeletal:     Left Lower Extremity: Left leg is amputated below knee.  Neurological:     Mental Status: He is alert.    ASSESSMENT/PLAN:   Bronchitis, acute Acute bronchitis features without findings suggestive of pneumonia. Patient is high risk of poor respiratory outcomes so wanting to avoid progression to pneumonia or hospitalization.  - mucinex + sudafed +  atrovent nasal spray + good hydration + incentive spirometer as much as possible while awake and chest percussion  - oxygen use as needed for O2 <88% - return and ED precautions provided. - if not improving with treatment in a couple days-->chest xray Patient and caregiver expressed understanding     Richarda Osmond, [place]

## 2020-08-26 NOTE — Patient Instructions (Signed)
It was a pleasure to see you today!  To summarize our discussion for this visit:   My concern for your cough is the mucus in your airways that can lead to secondary pneumonia. So, I'd like to treat the mucus pretty aggressively. Please take dextrofmethorphan and guaifenesin as the box indicates for the next couple days as well as a nasal spray to cut back on mucus. Using your incentive spirometer and having percussion to your back will likely help as well.  Please return or go to emergency department if oxygen saturations decrease below 88% consistently on your home oxygen or you feel you are unable to breath.     Some additional health maintenance measures we should update are: Health Maintenance Due  Topic Date Due  . FOOT EXAM  Never done  . OPHTHALMOLOGY EXAM  Never done  . URINE MICROALBUMIN  Never done  . TETANUS/TDAP  Never done  .    Please return to our clinic to see me as needed.  Call the clinic at [phone] if your symptoms worsen or you have any concerns.   Thank you for allowing me to take part in your care,  Dr. Doristine Mango  Acute Bronchitis, Adult  Acute bronchitis is when air tubes in the lungs (bronchi) suddenly get swollen. The condition can make it hard for you to breathe. In adults, acute bronchitis usually goes away within 2 weeks. A cough caused by bronchitis may last up to 3 weeks. Smoking, allergies, and asthma can make the condition worse. What are the causes? This condition is caused by:  Cold and flu viruses. The most common cause of this condition is the virus that causes the common cold.  Bacteria.  Substances that irritate the lungs, including: ? Smoke from cigarettes and other types of tobacco. ? Dust and pollen. ? Fumes from chemicals, gases, or burned fuel. ? Other materials that pollute indoor or outdoor air.  Close contact with someone who has acute bronchitis. What increases the risk? The following factors may make you more  likely to develop this condition:  A weak body's defense system. This is also called the immune system.  Any condition that affects your lungs and breathing, such as asthma. What are the signs or symptoms? Symptoms of this condition include:  A cough.  Coughing up clear, yellow, or green mucus.  Wheezing.  Chest congestion.  Shortness of breath.  A fever.  Body aches.  Chills.  A sore throat. How is this treated? Acute bronchitis may go away over time without treatment. Your doctor may recommend:  Drinking more fluids.  Taking a medicine for a fever or cough.  Using a device that gets medicine into your lungs (inhaler).  Using a vaporizer or a humidifier. These are machines that add water or moisture in the air to help with coughing and poor breathing. Follow these instructions at home: Activity  Get a lot of rest.  Avoid places where there are fumes from chemicals.  Return to your normal activities as told by your doctor. Ask your doctor what activities are safe for you. Lifestyle  Drink enough fluids to keep your pee (urine) pale yellow.  Do not drink alcohol.  Do not use any products that contain nicotine or tobacco, such as cigarettes, e-cigarettes, and chewing tobacco. If you need help quitting, ask your doctor. Be aware that: ? Your bronchitis will get worse if you smoke or breathe in other people's smoke (secondhand smoke). ? Your lungs will heal  faster if you quit smoking. General instructions  Take over-the-counter and prescription medicines only as told by your doctor.  Use an inhaler, cool mist vaporizer, or humidifier as told by your doctor.  Rinse your mouth often with salt water. To make salt water, dissolve -1 tsp (3-6 g) of salt in 1 cup (237 mL) of warm water.  Keep all follow-up visits as told by your doctor. This is important.   How is this prevented? To lower your risk of getting this condition again:  Wash your hands often with  soap and water. If soap and water are not available, use hand sanitizer.  Avoid contact with people who have cold symptoms.  Try not to touch your mouth, nose, or eyes with your hands.  Make sure to get the flu shot every year.   Contact a doctor if:  Your symptoms do not get better in 2 weeks.  You vomit more than once or twice.  You have symptoms of loss of fluid from your body (dehydration). These include: ? Dark urine. ? Dry skin or eyes. ? Increased thirst. ? Headaches. ? Confusion. ? Muscle cramps. Get help right away if:  You cough up blood.  You have chest pain.  You have very bad shortness of breath.  You become dehydrated.  You faint or keep feeling like you are going to faint.  You keep vomiting.  You have a very bad headache.  Your fever or chills get worse. These symptoms may be an emergency. Do not wait to see if the symptoms will go away. Get medical help right away. Call your local emergency services (911 in the [place] Do not drive yourself to the hospital. Summary  Acute bronchitis is when air tubes in the lungs (bronchi) suddenly get swollen. In adults, acute bronchitis usually goes away within 2 weeks.  Take over-the-counter and prescription medicines only as told by your doctor.  Drink enough fluid to keep your pee (urine) pale yellow.  Contact a doctor if your symptoms do not improve after 2 weeks of treatment.  Get help right away if you cough up blood, faint, or have chest pain or shortness of breath. This information is not intended to replace advice given to you by your health care provider. Make sure you discuss any questions you have with your health care provider. Document Revised: 11/02/2018 Document Reviewed: 11/02/2018 Elsevier Patient Education  [place].

## 2020-08-27 DIAGNOSIS — Z9641 Presence of insulin pump (external) (internal): Secondary | ICD-10-CM | POA: Diagnosis not present

## 2020-08-27 DIAGNOSIS — E108 Type 1 diabetes mellitus with unspecified complications: Secondary | ICD-10-CM | POA: Diagnosis not present

## 2020-08-27 LAB — HEMOGLOBIN A1C: Hemoglobin A1C: 7.4

## 2020-08-28 DIAGNOSIS — N186 End stage renal disease: Secondary | ICD-10-CM | POA: Diagnosis not present

## 2020-08-28 DIAGNOSIS — J209 Acute bronchitis, unspecified: Secondary | ICD-10-CM | POA: Insufficient documentation

## 2020-08-28 DIAGNOSIS — N2581 Secondary hyperparathyroidism of renal origin: Secondary | ICD-10-CM | POA: Diagnosis not present

## 2020-08-28 DIAGNOSIS — D509 Iron deficiency anemia, unspecified: Secondary | ICD-10-CM | POA: Diagnosis not present

## 2020-08-28 DIAGNOSIS — Z992 Dependence on renal dialysis: Secondary | ICD-10-CM | POA: Diagnosis not present

## 2020-08-28 DIAGNOSIS — R52 Pain, unspecified: Secondary | ICD-10-CM | POA: Diagnosis not present

## 2020-08-28 DIAGNOSIS — T82818A Embolism of vascular prosthetic devices, implants and grafts, initial encounter: Secondary | ICD-10-CM | POA: Diagnosis not present

## 2020-08-28 DIAGNOSIS — D631 Anemia in chronic kidney disease: Secondary | ICD-10-CM | POA: Diagnosis not present

## 2020-08-28 DIAGNOSIS — D689 Coagulation defect, unspecified: Secondary | ICD-10-CM | POA: Diagnosis not present

## 2020-08-28 NOTE — Assessment & Plan Note (Addendum)
Acute bronchitis features without findings suggestive of pneumonia. Patient is high risk of poor respiratory outcomes so wanting to avoid progression to pneumonia or hospitalization.  - mucinex + sudafed +  atrovent nasal spray + good hydration + incentive spirometer as much as possible while awake and chest percussion  - oxygen use as needed for O2 <88% - return and ED precautions provided. - if not improving with treatment in a couple days-->chest xray Patient and caregiver expressed understanding

## 2020-08-31 DIAGNOSIS — Z992 Dependence on renal dialysis: Secondary | ICD-10-CM | POA: Diagnosis not present

## 2020-08-31 DIAGNOSIS — D509 Iron deficiency anemia, unspecified: Secondary | ICD-10-CM | POA: Diagnosis not present

## 2020-08-31 DIAGNOSIS — N186 End stage renal disease: Secondary | ICD-10-CM | POA: Diagnosis not present

## 2020-08-31 DIAGNOSIS — T82818A Embolism of vascular prosthetic devices, implants and grafts, initial encounter: Secondary | ICD-10-CM | POA: Diagnosis not present

## 2020-08-31 DIAGNOSIS — D631 Anemia in chronic kidney disease: Secondary | ICD-10-CM | POA: Diagnosis not present

## 2020-08-31 DIAGNOSIS — E108 Type 1 diabetes mellitus with unspecified complications: Secondary | ICD-10-CM | POA: Diagnosis not present

## 2020-08-31 DIAGNOSIS — N2581 Secondary hyperparathyroidism of renal origin: Secondary | ICD-10-CM | POA: Diagnosis not present

## 2020-08-31 DIAGNOSIS — D689 Coagulation defect, unspecified: Secondary | ICD-10-CM | POA: Diagnosis not present

## 2020-09-01 ENCOUNTER — Encounter: Payer: Self-pay | Admitting: Family Medicine

## 2020-09-01 ENCOUNTER — Ambulatory Visit (INDEPENDENT_AMBULATORY_CARE_PROVIDER_SITE_OTHER): Payer: Medicare Other | Admitting: Family Medicine

## 2020-09-01 ENCOUNTER — Other Ambulatory Visit: Payer: Self-pay

## 2020-09-01 DIAGNOSIS — K219 Gastro-esophageal reflux disease without esophagitis: Secondary | ICD-10-CM | POA: Diagnosis not present

## 2020-09-01 DIAGNOSIS — I10 Essential (primary) hypertension: Secondary | ICD-10-CM

## 2020-09-01 DIAGNOSIS — Z992 Dependence on renal dialysis: Secondary | ICD-10-CM

## 2020-09-01 DIAGNOSIS — E1022 Type 1 diabetes mellitus with diabetic chronic kidney disease: Secondary | ICD-10-CM | POA: Diagnosis not present

## 2020-09-01 DIAGNOSIS — N186 End stage renal disease: Secondary | ICD-10-CM

## 2020-09-01 NOTE — Assessment & Plan Note (Signed)
-  following up with endocrinology outpatient regularly, most recent visit 08/27/2020, continue on current regimen, A1c 7.4

## 2020-09-01 NOTE — Patient Instructions (Signed)
It was great seeing you today!  I am so glad that you are doing well! Please continue to eat healthy and exercise as we discussed. Continue to take your medications as prescribed, especially your amlodipine. I am glad your reflux has improved significantly since our last visit.   Please follow up at your next scheduled appointment, if anything arises between now and then, please don't hesitate to contact our office.   Thank you for allowing [place] to be a part of your medical care!  Thank you, Dr. Larae Grooms    DASH Eating Plan DASH stands for Dietary Approaches to Stop Hypertension. The DASH eating plan is a healthy eating plan that has been shown to:  Reduce high blood pressure (hypertension).  Reduce your risk for type 2 diabetes, heart disease, and stroke.  Help with weight loss. What are tips for following this plan? Reading food labels  Check food labels for the amount of salt (sodium) per serving. Choose foods with less than 5 percent of the Daily Value of sodium. Generally, foods with less than 300 milligrams (mg) of sodium per serving fit into this eating plan.  To find whole grains, look for the word "whole" as the first word in the ingredient list. Shopping  Buy products labeled as "low-sodium" or "no salt added."  Buy fresh foods. Avoid canned foods and pre-made or frozen meals. Cooking  Avoid adding salt when cooking. Use salt-free seasonings or herbs instead of table salt or sea salt. Check with your health care provider or pharmacist before using salt substitutes.  Do not fry foods. Cook foods using healthy methods such as baking, boiling, grilling, roasting, and broiling instead.  Cook with heart-healthy oils, such as olive, canola, avocado, soybean, or sunflower oil. Meal planning  Eat a balanced diet that includes: ? 4 or more servings of fruits and 4 or more servings of vegetables each day. Try to fill one-half of your plate with fruits and vegetables. ? 6-8  servings of whole grains each day. ? Less than 6 oz (170 g) of lean meat, poultry, or fish each day. A 3-oz (85-g) serving of meat is about the same size as a deck of cards. One egg equals 1 oz (28 g). ? 2-3 servings of low-fat dairy each day. One serving is 1 cup (237 mL). ? 1 serving of nuts, seeds, or beans 5 times each week. ? 2-3 servings of heart-healthy fats. Healthy fats called omega-3 fatty acids are found in foods such as walnuts, flaxseeds, fortified milks, and eggs. These fats are also found in cold-water fish, such as sardines, salmon, and mackerel.  Limit how much you eat of: ? Canned or prepackaged foods. ? Food that is high in trans fat, such as some fried foods. ? Food that is high in saturated fat, such as fatty meat. ? Desserts and other sweets, sugary drinks, and other foods with added sugar. ? Full-fat dairy products.  Do not salt foods before eating.  Do not eat more than 4 egg yolks a week.  Try to eat at least 2 vegetarian meals a week.  Eat more home-cooked food and less restaurant, buffet, and fast food.   Lifestyle  When eating at a restaurant, ask that your food be prepared with less salt or no salt, if possible.  If you drink alcohol: ? Limit how much you use to:  0-1 drink a day for women who are not pregnant.  0-2 drinks a day for men. ? Be aware of  how much alcohol is in your drink. In the [place] one drink equals one 12 oz bottle of beer (355 mL), one 5 oz glass of wine (148 mL), or one 1 oz glass of hard liquor (44 mL). General information  Avoid eating more than 2,300 mg of salt a day. If you have hypertension, you may need to reduce your sodium intake to 1,500 mg a day.  Work with your health care provider to maintain a healthy body weight or to lose weight. Ask what an ideal weight is for you.  Get at least 30 minutes of exercise that causes your heart to beat faster (aerobic exercise) most days of the week. Activities may include walking,  swimming, or biking.  Work with your health care provider or dietitian to adjust your eating plan to your individual calorie needs. What foods should I eat? Fruits All fresh, dried, or frozen fruit. Canned fruit in natural juice (without added sugar). Vegetables Fresh or frozen vegetables (raw, steamed, roasted, or grilled). Low-sodium or reduced-sodium tomato and vegetable juice. Low-sodium or reduced-sodium tomato sauce and tomato paste. Low-sodium or reduced-sodium canned vegetables. Grains Whole-grain or whole-wheat bread. Whole-grain or whole-wheat pasta. Brown rice. [place]. Bulgur. Whole-grain and low-sodium cereals. Pita bread. Low-fat, low-sodium crackers. Whole-wheat flour tortillas. Meats and other proteins Skinless chicken or [place]. Ground chicken or [place]. Pork with fat trimmed off. Fish and seafood. Egg whites. Dried beans, peas, or lentils. Unsalted nuts, nut butters, and seeds. Unsalted canned beans. Lean cuts of beef with fat trimmed off. Low-sodium, lean precooked or cured meat, such as sausages or meat loaves. Dairy Low-fat (1%) or fat-free (skim) milk. Reduced-fat, low-fat, or fat-free cheeses. Nonfat, low-sodium ricotta or cottage cheese. Low-fat or nonfat yogurt. Low-fat, low-sodium cheese. Fats and oils Soft margarine without trans fats. Vegetable oil. Reduced-fat, low-fat, or light mayonnaise and salad dressings (reduced-sodium). Canola, safflower, olive, avocado, soybean, and sunflower oils. Avocado. Seasonings and condiments Herbs. Spices. Seasoning mixes without salt. Other foods Unsalted popcorn and pretzels. Fat-free sweets. The items listed above may not be a complete list of foods and beverages you can eat. Contact a dietitian for more information. What foods should I avoid? Fruits Canned fruit in a light or heavy syrup. Fried fruit. Fruit in cream or butter sauce. Vegetables Creamed or fried vegetables. Vegetables in a cheese sauce. Regular canned  vegetables (not low-sodium or reduced-sodium). Regular canned tomato sauce and paste (not low-sodium or reduced-sodium). Regular tomato and vegetable juice (not low-sodium or reduced-sodium). Angie Fava. Olives. Grains Baked goods made with fat, such as croissants, muffins, or some breads. Dry pasta or rice meal packs. Meats and other proteins Fatty cuts of meat. Ribs. Fried meat. Berniece Salines. Bologna, salami, and other precooked or cured meats, such as sausages or meat loaves. Fat from the back of a pig (fatback). Bratwurst. Salted nuts and seeds. Canned beans with added salt. Canned or smoked fish. Whole eggs or egg yolks. Chicken or [place] with skin. Dairy Whole or 2% milk, cream, and half-and-half. Whole or full-fat cream cheese. Whole-fat or sweetened yogurt. Full-fat cheese. Nondairy creamers. Whipped toppings. Processed cheese and cheese spreads. Fats and oils Butter. Stick margarine. Lard. Shortening. Ghee. Bacon fat. Tropical oils, such as coconut, palm kernel, or palm oil. Seasonings and condiments Onion salt, garlic salt, seasoned salt, table salt, and sea salt. Worcestershire sauce. Tartar sauce. Barbecue sauce. Teriyaki sauce. Soy sauce, including reduced-sodium. Steak sauce. Canned and packaged gravies. Fish sauce. Oyster sauce. Cocktail sauce. Store-bought horseradish. Ketchup. Mustard. Meat flavorings and  tenderizers. Bouillon cubes. Hot sauces. Pre-made or packaged marinades. Pre-made or packaged taco seasonings. Relishes. Regular salad dressings. Other foods Salted popcorn and pretzels. The items listed above may not be a complete list of foods and beverages you should avoid. Contact a dietitian for more information. Where to find more information  National Heart, Lung, and Blood Institute: https://wilson-eaton.com/  American Heart Association: www.heart.org  Academy of Nutrition and Dietetics: www.eatright.[place]: www.kidney.org Summary  The DASH eating plan is a  healthy eating plan that has been shown to reduce high blood pressure (hypertension). It may also reduce your risk for type 2 diabetes, heart disease, and stroke.  When on the DASH eating plan, aim to eat more fresh fruits and vegetables, whole grains, lean proteins, low-fat dairy, and heart-healthy fats.  With the DASH eating plan, you should limit salt (sodium) intake to 2,300 mg a day. If you have hypertension, you may need to reduce your sodium intake to 1,500 mg a day.  Work with your health care provider or dietitian to adjust your eating plan to your individual calorie needs. This information is not intended to replace advice given to you by your health care provider. Make sure you discuss any questions you have with your health care provider. Document Revised: 03/15/2019 Document Reviewed: 03/15/2019 Elsevier Patient Education  2021 [employer].

## 2020-09-01 NOTE — Progress Notes (Signed)
    SUBJECTIVE:   CHIEF COMPLAINT / HPI:   Patient denies any concerns at this time.   PERTINENT  PMH / PSH:   Hypertension Patient's BP at last visit was 174/82. Compliant on amlodipine 5 mg daily. Denies chest pain, dyspnea, palpitations and leg swelling. States that he has not been exercising recently but has made significant changes to his diet. Reports cutting down on junk food and sodas while incorporating more healthy options. Has lost 4 pounds his last visit which he attributes to actually trying to make more healthy lifestyle choices.   GERD Endorses intermittent reflux without epigastric pain. Reports that he has symptoms consistent with reflux only intermittently which is a significant improvement from previously. Denies associated cough or globulus sensation. States that he has yet to cough today. Takes pepsid daily which is helping. He has yet to see a GI specialist regarding this issue but feels satisfied with his symptoms given that they have improved.   OBJECTIVE:   BP 138/74   Pulse (!) 58   Wt 243 lb (110.2 kg)   SpO2 98%   BMI 36.41 kg/m   General: Patient well-appearing, in no acute distress. CV: RRR, no murmurs or gallops auscultated Resp: CTAB, no wheezing, rales or rhonchi noted, no evidence of respiratory distress noted  Abdomen: soft, nontender, nondistended, presence of bowel sounds Ext: no edema noted, compression stocking in place on right leg, left LE prosthesis, radial pulses present Neuro: ambulates with assistance of wheelchair Psych: mood appropriate  ASSESSMENT/PLAN:   T1DM (type 1 diabetes mellitus) ([place]) -following up with endocrinology outpatient regularly, most recent visit 08/27/2020, continue on current regimen, A1c 7.4  HTN (hypertension) -BP 138/74 today -continue current regimen of amlodipine 5 mg daily -diet and exercise counseling provided along with reassurance  GERD (gastroesophageal reflux disease) -given patient has  demonstrated significant improvement and asymptomatic, continue current regimen of pepsid  -consider H pylori testing if symptoms recur and worsen along with GI referral  -GERD precautions discussed     Completed DMV paperwork returned to patient.   Donney Dice, [place]

## 2020-09-01 NOTE — Assessment & Plan Note (Signed)
-  BP 138/74 today -continue current regimen of amlodipine 5 mg daily -diet and exercise counseling provided along with reassurance

## 2020-09-01 NOTE — Assessment & Plan Note (Signed)
-  given patient has demonstrated significant improvement and asymptomatic, continue current regimen of pepsid  -consider H pylori testing if symptoms recur and worsen along with GI referral  -GERD precautions discussed

## 2020-09-02 DIAGNOSIS — T82818A Embolism of vascular prosthetic devices, implants and grafts, initial encounter: Secondary | ICD-10-CM | POA: Diagnosis not present

## 2020-09-02 DIAGNOSIS — N186 End stage renal disease: Secondary | ICD-10-CM | POA: Diagnosis not present

## 2020-09-02 DIAGNOSIS — D631 Anemia in chronic kidney disease: Secondary | ICD-10-CM | POA: Diagnosis not present

## 2020-09-02 DIAGNOSIS — D689 Coagulation defect, unspecified: Secondary | ICD-10-CM | POA: Diagnosis not present

## 2020-09-02 DIAGNOSIS — N2581 Secondary hyperparathyroidism of renal origin: Secondary | ICD-10-CM | POA: Diagnosis not present

## 2020-09-02 DIAGNOSIS — Z992 Dependence on renal dialysis: Secondary | ICD-10-CM | POA: Diagnosis not present

## 2020-09-02 DIAGNOSIS — D509 Iron deficiency anemia, unspecified: Secondary | ICD-10-CM | POA: Diagnosis not present

## 2020-09-04 DIAGNOSIS — D509 Iron deficiency anemia, unspecified: Secondary | ICD-10-CM | POA: Diagnosis not present

## 2020-09-04 DIAGNOSIS — N186 End stage renal disease: Secondary | ICD-10-CM | POA: Diagnosis not present

## 2020-09-04 DIAGNOSIS — D689 Coagulation defect, unspecified: Secondary | ICD-10-CM | POA: Diagnosis not present

## 2020-09-04 DIAGNOSIS — N2581 Secondary hyperparathyroidism of renal origin: Secondary | ICD-10-CM | POA: Diagnosis not present

## 2020-09-04 DIAGNOSIS — T82818A Embolism of vascular prosthetic devices, implants and grafts, initial encounter: Secondary | ICD-10-CM | POA: Diagnosis not present

## 2020-09-04 DIAGNOSIS — Z992 Dependence on renal dialysis: Secondary | ICD-10-CM | POA: Diagnosis not present

## 2020-09-04 DIAGNOSIS — D631 Anemia in chronic kidney disease: Secondary | ICD-10-CM | POA: Diagnosis not present

## 2020-09-07 DIAGNOSIS — D509 Iron deficiency anemia, unspecified: Secondary | ICD-10-CM | POA: Diagnosis not present

## 2020-09-07 DIAGNOSIS — D689 Coagulation defect, unspecified: Secondary | ICD-10-CM | POA: Diagnosis not present

## 2020-09-07 DIAGNOSIS — N2581 Secondary hyperparathyroidism of renal origin: Secondary | ICD-10-CM | POA: Diagnosis not present

## 2020-09-07 DIAGNOSIS — N186 End stage renal disease: Secondary | ICD-10-CM | POA: Diagnosis not present

## 2020-09-07 DIAGNOSIS — Z992 Dependence on renal dialysis: Secondary | ICD-10-CM | POA: Diagnosis not present

## 2020-09-07 DIAGNOSIS — E1022 Type 1 diabetes mellitus with diabetic chronic kidney disease: Secondary | ICD-10-CM | POA: Diagnosis not present

## 2020-09-07 DIAGNOSIS — R52 Pain, unspecified: Secondary | ICD-10-CM | POA: Diagnosis not present

## 2020-09-07 DIAGNOSIS — D631 Anemia in chronic kidney disease: Secondary | ICD-10-CM | POA: Diagnosis not present

## 2020-09-07 DIAGNOSIS — T82818A Embolism of vascular prosthetic devices, implants and grafts, initial encounter: Secondary | ICD-10-CM | POA: Diagnosis not present

## 2020-09-09 DIAGNOSIS — D509 Iron deficiency anemia, unspecified: Secondary | ICD-10-CM | POA: Diagnosis not present

## 2020-09-09 DIAGNOSIS — D689 Coagulation defect, unspecified: Secondary | ICD-10-CM | POA: Diagnosis not present

## 2020-09-09 DIAGNOSIS — N186 End stage renal disease: Secondary | ICD-10-CM | POA: Diagnosis not present

## 2020-09-09 DIAGNOSIS — E1022 Type 1 diabetes mellitus with diabetic chronic kidney disease: Secondary | ICD-10-CM | POA: Diagnosis not present

## 2020-09-09 DIAGNOSIS — N2581 Secondary hyperparathyroidism of renal origin: Secondary | ICD-10-CM | POA: Diagnosis not present

## 2020-09-09 DIAGNOSIS — Z992 Dependence on renal dialysis: Secondary | ICD-10-CM | POA: Diagnosis not present

## 2020-09-09 DIAGNOSIS — R52 Pain, unspecified: Secondary | ICD-10-CM | POA: Diagnosis not present

## 2020-09-09 DIAGNOSIS — D631 Anemia in chronic kidney disease: Secondary | ICD-10-CM | POA: Diagnosis not present

## 2020-09-09 DIAGNOSIS — T82818A Embolism of vascular prosthetic devices, implants and grafts, initial encounter: Secondary | ICD-10-CM | POA: Diagnosis not present

## 2020-09-11 DIAGNOSIS — T82818A Embolism of vascular prosthetic devices, implants and grafts, initial encounter: Secondary | ICD-10-CM | POA: Diagnosis not present

## 2020-09-11 DIAGNOSIS — N186 End stage renal disease: Secondary | ICD-10-CM | POA: Diagnosis not present

## 2020-09-11 DIAGNOSIS — Z992 Dependence on renal dialysis: Secondary | ICD-10-CM | POA: Diagnosis not present

## 2020-09-11 DIAGNOSIS — N2581 Secondary hyperparathyroidism of renal origin: Secondary | ICD-10-CM | POA: Diagnosis not present

## 2020-09-11 DIAGNOSIS — E1022 Type 1 diabetes mellitus with diabetic chronic kidney disease: Secondary | ICD-10-CM | POA: Diagnosis not present

## 2020-09-11 DIAGNOSIS — R52 Pain, unspecified: Secondary | ICD-10-CM | POA: Diagnosis not present

## 2020-09-11 DIAGNOSIS — D689 Coagulation defect, unspecified: Secondary | ICD-10-CM | POA: Diagnosis not present

## 2020-09-11 DIAGNOSIS — D509 Iron deficiency anemia, unspecified: Secondary | ICD-10-CM | POA: Diagnosis not present

## 2020-09-11 DIAGNOSIS — D631 Anemia in chronic kidney disease: Secondary | ICD-10-CM | POA: Diagnosis not present

## 2020-09-14 DIAGNOSIS — D631 Anemia in chronic kidney disease: Secondary | ICD-10-CM | POA: Diagnosis not present

## 2020-09-14 DIAGNOSIS — D509 Iron deficiency anemia, unspecified: Secondary | ICD-10-CM | POA: Diagnosis not present

## 2020-09-14 DIAGNOSIS — T82818A Embolism of vascular prosthetic devices, implants and grafts, initial encounter: Secondary | ICD-10-CM | POA: Diagnosis not present

## 2020-09-14 DIAGNOSIS — D689 Coagulation defect, unspecified: Secondary | ICD-10-CM | POA: Diagnosis not present

## 2020-09-14 DIAGNOSIS — Z992 Dependence on renal dialysis: Secondary | ICD-10-CM | POA: Diagnosis not present

## 2020-09-14 DIAGNOSIS — N186 End stage renal disease: Secondary | ICD-10-CM | POA: Diagnosis not present

## 2020-09-14 DIAGNOSIS — N2581 Secondary hyperparathyroidism of renal origin: Secondary | ICD-10-CM | POA: Diagnosis not present

## 2020-09-16 DIAGNOSIS — Z992 Dependence on renal dialysis: Secondary | ICD-10-CM | POA: Diagnosis not present

## 2020-09-16 DIAGNOSIS — N186 End stage renal disease: Secondary | ICD-10-CM | POA: Diagnosis not present

## 2020-09-16 DIAGNOSIS — D509 Iron deficiency anemia, unspecified: Secondary | ICD-10-CM | POA: Diagnosis not present

## 2020-09-16 DIAGNOSIS — D689 Coagulation defect, unspecified: Secondary | ICD-10-CM | POA: Diagnosis not present

## 2020-09-16 DIAGNOSIS — T82818A Embolism of vascular prosthetic devices, implants and grafts, initial encounter: Secondary | ICD-10-CM | POA: Diagnosis not present

## 2020-09-16 DIAGNOSIS — D631 Anemia in chronic kidney disease: Secondary | ICD-10-CM | POA: Diagnosis not present

## 2020-09-16 DIAGNOSIS — N2581 Secondary hyperparathyroidism of renal origin: Secondary | ICD-10-CM | POA: Diagnosis not present

## 2020-09-18 DIAGNOSIS — D689 Coagulation defect, unspecified: Secondary | ICD-10-CM | POA: Diagnosis not present

## 2020-09-18 DIAGNOSIS — D509 Iron deficiency anemia, unspecified: Secondary | ICD-10-CM | POA: Diagnosis not present

## 2020-09-18 DIAGNOSIS — T82818A Embolism of vascular prosthetic devices, implants and grafts, initial encounter: Secondary | ICD-10-CM | POA: Diagnosis not present

## 2020-09-18 DIAGNOSIS — D631 Anemia in chronic kidney disease: Secondary | ICD-10-CM | POA: Diagnosis not present

## 2020-09-18 DIAGNOSIS — N186 End stage renal disease: Secondary | ICD-10-CM | POA: Diagnosis not present

## 2020-09-18 DIAGNOSIS — N2581 Secondary hyperparathyroidism of renal origin: Secondary | ICD-10-CM | POA: Diagnosis not present

## 2020-09-18 DIAGNOSIS — Z992 Dependence on renal dialysis: Secondary | ICD-10-CM | POA: Diagnosis not present

## 2020-09-21 DIAGNOSIS — T82818A Embolism of vascular prosthetic devices, implants and grafts, initial encounter: Secondary | ICD-10-CM | POA: Diagnosis not present

## 2020-09-21 DIAGNOSIS — N2581 Secondary hyperparathyroidism of renal origin: Secondary | ICD-10-CM | POA: Diagnosis not present

## 2020-09-21 DIAGNOSIS — N186 End stage renal disease: Secondary | ICD-10-CM | POA: Diagnosis not present

## 2020-09-21 DIAGNOSIS — Z992 Dependence on renal dialysis: Secondary | ICD-10-CM | POA: Diagnosis not present

## 2020-09-21 DIAGNOSIS — D689 Coagulation defect, unspecified: Secondary | ICD-10-CM | POA: Diagnosis not present

## 2020-09-22 DIAGNOSIS — Z992 Dependence on renal dialysis: Secondary | ICD-10-CM | POA: Diagnosis not present

## 2020-09-22 DIAGNOSIS — N186 End stage renal disease: Secondary | ICD-10-CM | POA: Diagnosis not present

## 2020-09-22 DIAGNOSIS — T8612 Kidney transplant failure: Secondary | ICD-10-CM | POA: Diagnosis not present

## 2020-09-23 DIAGNOSIS — Z794 Long term (current) use of insulin: Secondary | ICD-10-CM | POA: Diagnosis not present

## 2020-09-23 DIAGNOSIS — Z992 Dependence on renal dialysis: Secondary | ICD-10-CM | POA: Diagnosis not present

## 2020-09-23 DIAGNOSIS — E108 Type 1 diabetes mellitus with unspecified complications: Secondary | ICD-10-CM | POA: Diagnosis not present

## 2020-09-23 DIAGNOSIS — E1040 Type 1 diabetes mellitus with diabetic neuropathy, unspecified: Secondary | ICD-10-CM | POA: Diagnosis not present

## 2020-09-23 DIAGNOSIS — N2581 Secondary hyperparathyroidism of renal origin: Secondary | ICD-10-CM | POA: Diagnosis not present

## 2020-09-23 DIAGNOSIS — E1059 Type 1 diabetes mellitus with other circulatory complications: Secondary | ICD-10-CM | POA: Diagnosis not present

## 2020-09-23 DIAGNOSIS — T82818A Embolism of vascular prosthetic devices, implants and grafts, initial encounter: Secondary | ICD-10-CM | POA: Diagnosis not present

## 2020-09-23 DIAGNOSIS — D509 Iron deficiency anemia, unspecified: Secondary | ICD-10-CM | POA: Diagnosis not present

## 2020-09-23 DIAGNOSIS — D631 Anemia in chronic kidney disease: Secondary | ICD-10-CM | POA: Diagnosis not present

## 2020-09-23 DIAGNOSIS — D689 Coagulation defect, unspecified: Secondary | ICD-10-CM | POA: Diagnosis not present

## 2020-09-23 DIAGNOSIS — N186 End stage renal disease: Secondary | ICD-10-CM | POA: Diagnosis not present

## 2020-09-25 DIAGNOSIS — N2581 Secondary hyperparathyroidism of renal origin: Secondary | ICD-10-CM | POA: Diagnosis not present

## 2020-09-25 DIAGNOSIS — N186 End stage renal disease: Secondary | ICD-10-CM | POA: Diagnosis not present

## 2020-09-25 DIAGNOSIS — D509 Iron deficiency anemia, unspecified: Secondary | ICD-10-CM | POA: Diagnosis not present

## 2020-09-25 DIAGNOSIS — D631 Anemia in chronic kidney disease: Secondary | ICD-10-CM | POA: Diagnosis not present

## 2020-09-25 DIAGNOSIS — Z992 Dependence on renal dialysis: Secondary | ICD-10-CM | POA: Diagnosis not present

## 2020-09-25 DIAGNOSIS — D689 Coagulation defect, unspecified: Secondary | ICD-10-CM | POA: Diagnosis not present

## 2020-09-25 DIAGNOSIS — T82818A Embolism of vascular prosthetic devices, implants and grafts, initial encounter: Secondary | ICD-10-CM | POA: Diagnosis not present

## 2020-09-28 DIAGNOSIS — D689 Coagulation defect, unspecified: Secondary | ICD-10-CM | POA: Diagnosis not present

## 2020-09-28 DIAGNOSIS — D631 Anemia in chronic kidney disease: Secondary | ICD-10-CM | POA: Diagnosis not present

## 2020-09-28 DIAGNOSIS — N2581 Secondary hyperparathyroidism of renal origin: Secondary | ICD-10-CM | POA: Diagnosis not present

## 2020-09-28 DIAGNOSIS — D509 Iron deficiency anemia, unspecified: Secondary | ICD-10-CM | POA: Diagnosis not present

## 2020-09-28 DIAGNOSIS — Z992 Dependence on renal dialysis: Secondary | ICD-10-CM | POA: Diagnosis not present

## 2020-09-28 DIAGNOSIS — N186 End stage renal disease: Secondary | ICD-10-CM | POA: Diagnosis not present

## 2020-09-28 DIAGNOSIS — T82818A Embolism of vascular prosthetic devices, implants and grafts, initial encounter: Secondary | ICD-10-CM | POA: Diagnosis not present

## 2020-09-30 DIAGNOSIS — N2581 Secondary hyperparathyroidism of renal origin: Secondary | ICD-10-CM | POA: Diagnosis not present

## 2020-09-30 DIAGNOSIS — E108 Type 1 diabetes mellitus with unspecified complications: Secondary | ICD-10-CM | POA: Diagnosis not present

## 2020-09-30 DIAGNOSIS — D689 Coagulation defect, unspecified: Secondary | ICD-10-CM | POA: Diagnosis not present

## 2020-09-30 DIAGNOSIS — D631 Anemia in chronic kidney disease: Secondary | ICD-10-CM | POA: Diagnosis not present

## 2020-09-30 DIAGNOSIS — T82818A Embolism of vascular prosthetic devices, implants and grafts, initial encounter: Secondary | ICD-10-CM | POA: Diagnosis not present

## 2020-09-30 DIAGNOSIS — Z992 Dependence on renal dialysis: Secondary | ICD-10-CM | POA: Diagnosis not present

## 2020-09-30 DIAGNOSIS — D509 Iron deficiency anemia, unspecified: Secondary | ICD-10-CM | POA: Diagnosis not present

## 2020-09-30 DIAGNOSIS — N186 End stage renal disease: Secondary | ICD-10-CM | POA: Diagnosis not present

## 2020-10-02 DIAGNOSIS — N2581 Secondary hyperparathyroidism of renal origin: Secondary | ICD-10-CM | POA: Diagnosis not present

## 2020-10-02 DIAGNOSIS — T82818A Embolism of vascular prosthetic devices, implants and grafts, initial encounter: Secondary | ICD-10-CM | POA: Diagnosis not present

## 2020-10-02 DIAGNOSIS — N186 End stage renal disease: Secondary | ICD-10-CM | POA: Diagnosis not present

## 2020-10-02 DIAGNOSIS — D689 Coagulation defect, unspecified: Secondary | ICD-10-CM | POA: Diagnosis not present

## 2020-10-02 DIAGNOSIS — Z992 Dependence on renal dialysis: Secondary | ICD-10-CM | POA: Diagnosis not present

## 2020-10-02 DIAGNOSIS — D509 Iron deficiency anemia, unspecified: Secondary | ICD-10-CM | POA: Diagnosis not present

## 2020-10-02 DIAGNOSIS — D631 Anemia in chronic kidney disease: Secondary | ICD-10-CM | POA: Diagnosis not present

## 2020-10-05 DIAGNOSIS — E1022 Type 1 diabetes mellitus with diabetic chronic kidney disease: Secondary | ICD-10-CM | POA: Diagnosis not present

## 2020-10-05 DIAGNOSIS — N186 End stage renal disease: Secondary | ICD-10-CM | POA: Diagnosis not present

## 2020-10-05 DIAGNOSIS — N2581 Secondary hyperparathyroidism of renal origin: Secondary | ICD-10-CM | POA: Diagnosis not present

## 2020-10-05 DIAGNOSIS — D631 Anemia in chronic kidney disease: Secondary | ICD-10-CM | POA: Diagnosis not present

## 2020-10-05 DIAGNOSIS — Z992 Dependence on renal dialysis: Secondary | ICD-10-CM | POA: Diagnosis not present

## 2020-10-05 DIAGNOSIS — T82818A Embolism of vascular prosthetic devices, implants and grafts, initial encounter: Secondary | ICD-10-CM | POA: Diagnosis not present

## 2020-10-05 DIAGNOSIS — D689 Coagulation defect, unspecified: Secondary | ICD-10-CM | POA: Diagnosis not present

## 2020-10-05 DIAGNOSIS — D509 Iron deficiency anemia, unspecified: Secondary | ICD-10-CM | POA: Diagnosis not present

## 2020-10-05 NOTE — Progress Notes (Signed)
Cardiology Office Note:    Date:  10/06/2020   ID:  Greysin Medlen, DOB 09/08/1965, MRN [id number]  PCP:  Donney Dice, DO   [facility] Providers Cardiologist:  Lauree Chandler, MD      Referring MD: Donney Dice, DO   Chief Complaint:  Follow-up (CAD)    Patient Profile:    Dale [name] is a 55 y.o. male with:  Coronary artery disease  S/p POBA to mid RCA >> Rotablator atherectomy/DES to mid RCA in 2015 IV dye allergy ?Plavix allergy; + nausea with ticagrelor >> has done well with prasugrel  Myoview 11/16: Low risk S/p NSTEMI >> Myoview 9/17: + Anterolateral ischemia, intermediate risk Cath 9/17: Mid Dx 95 > >s/p DES to Dx Heart failure with preserved ejection fraction  Echo 7/18: EF 60-65, GR 1 DD ESRD S/p renal transplant; failed after admission with Salmonella sepsis in 2014 Hypertension  Hypotension >> takes midodrine on dialysis days >> DC'd in early 2022 Diabetes mellitus  Hypertension Sleep apnea Anemia Peripheral arterial disease  S/p L BKA Fibromyalgia Hypothyroidism Hyperlipidemia   Prior CV Studies: Echocardiogram 05/28/20 ([facility]) EF 55-60, mild conc LVH, mild MR, mild TR, mod pulmonary htn (RVSP 56.4)  Echocardiogram 11/01/2016 Moderate LVH, EF 60-65, no RWMA, GR 1 DD, trivial MR, mild TR, PASP 39   Echocardiogram 02/03/2016 Mild LVH, EF 55-60, no RWMA, GRII DD, mild LAE   Cardiac catheterization 01/01/2016 LM normal LAD proximal 30, mid 30; Dx mid 95 LCx normal RCA mid stent patent, distal 30; inferior limb of PDA mid 50 PCI: 2.25 x 15 mm Xience Alpine DES to the Dx   Echocardiogram 12/23/2015 EF 60-65, GRII DD   Myoview 12/29/2015 Apical anterolateral ischemia, prior apical anterolateral infarct, EF 64; intermediate risk   Carotid US 04/09/15 IMPRESSION: 1. No evidence of significant atherosclerotic plaque or stenosis. 2. Vertebral arteries are patent with normal antegrade flow. 3. Intermittently irregular cardiac rhythm.    Myoview 02/26/15 Overall Study Impression:  Myocardial perfusion is normal. The study is normal. This is a low risk study. LV cavity size is normal. The left ventricular ejection fraction is mildly decreased (45-54%). There is no prior study for comparison.   Echo 02/25/15 Mod LVH, EF 60-65%, no RWMA, Gr 2 DD, mild MR   Echo 09/21/14 Severe LVH, EF 55-60%, normal wall motion, grade 1 diastolic dysfunction, MAC, mild LAE   PCI 04/09/14 1. Unstable angina secondary to severe stenosis mid RCA 2. Successful rotablator atherectomy/PTCA/DES mid RCA   History of Present Illness: Mr. [name] was last seen by Melina Copa, PA-C via Telemedicine in 4/22 for congestive heart failure.  The patient had been admitted to [facility] with decompensated HF in 2/22.  His BP had been much higher since then and he was off of midodrine.  He had been started on Amlodipine for BP control.  He was getting increasingly larger amounts of fluid taken off at dialysis and he was feeling much better.  He returns for f/u.  He is here with his wife. His dry weight has been reduced more.  He is feeling better.  His breathing is improved.  He has not had chest pain, syncope.  He uses O2 at night.  He cannot tolerate the full facemask for CPAP.      Past Medical History:  Diagnosis Date   (HFpEF) heart failure with preserved ejection fraction ([place])    a. Echo 3/13:  EF 55-60%;  b. Echo (11/15):  Mild LVH, EF 60-65%, no RWMA, Gr 1 DD, MAC, mild LAE, normal RVF, mild RAE;  c.  Echo 5/16:  severe LVH, EF 55-60%, no RWMA, Gr 1 DD, MAC, mild LAE // Echo 7/18: EF 60-65, Gr 1 DD   Anal fissure    Anemia of chronic disease    Arthritis    "hands, right knee" (03/19/2014) no cartlidge in right knee   CAD (coronary artery disease)    a. Abnl nuc ->LHC (11/15):  mid to dist Dx 80%, mid RCA 99% (functional CTO), dist RCA 80% >> PCI: balloon angioplasty to mid RCA (could not deliver stent) - staged PCI Rotoblator atherectomy/DES to  [facility]. b. NSTEMI 12/2015 s/p DES to diagonal.   Cholecystitis    a. 08/27/2011   Claustrophobia    when things get around his face.    Complication of anesthesia    Difficult intubation    only once in 2015 at [facility] haven't had a problem since then   Diverticulosis    Esophageal dysmotility    ESRD (end stage renal disease) ([place])    a. 1995 s/p cadaveric transplant w/ susbequent failure after 18 yrs;  b.Dialysis initiated 07/2011 [place] M-W-F   Fibromyalgia    Gastroparesis    GERD (gastroesophageal reflux disease)    Gout    PMH   History of blood transfusion    History of COVID-19 04/2020   tx with sotrovimab (mAb) and molnupiravir (nucleoside analog)   History of kidney stones    early 1990's   HLD (hyperlipidemia)    Hypertension    Hypothyroidism    Inguinal lymphadenopathy    a. bilateral - s/p biopsy 07/2011   Insulin dependent diabetes mellitus    Type I   Monilial esophagitis (HCC)    Morbid obesity (HCC)    OSA (obstructive sleep apnea)    adjustable bed and uses O2 L at bedtime   PAD (peripheral artery disease) ([place])    a. s/p L BKA.   Pericardial effusion    a.  Small by CT 08/22/11;  b.  Large by CT 08/27/11. c. Not seen on 11/2015 echo.   Pneumonia ~ 2007   PONV (postoperative nausea and vomiting)    vomited once after a procedure.   T1DM (type 1 diabetes mellitus) (HCC)    Tracheobronchomalacia     Current Medications: Current Meds  Medication Sig   acetaminophen (TYLENOL) 500 MG tablet Take 1,000 mg by mouth every 6 (six) hours as needed (pain/headaches.).    allopurinol (ZYLOPRIM) 100 MG tablet Take 200 mg by mouth daily. 2 tabs daily   amLODipine (NORVASC) 5 MG tablet Take 5 mg by mouth daily.   aspirin EC 81 MG tablet Take 81 mg by mouth at bedtime.   atorvastatin (LIPITOR) 20 MG tablet Take 20 mg by mouth daily.   B Complex-C-Folic Acid (DIALYVITE 009) 0.8 MG TABS Take 1 tablet by mouth daily.   calcium acetate (PHOSLO) 667 MG capsule Take  2,001 mg by mouth 3 (three) times daily with meals.   cetirizine (ZYRTEC) 10 MG tablet Take 10 mg by mouth daily.   Doxercalciferol (HECTOROL IV) as directed. dialysis center   fluticasone (FLONASE) 50 MCG/ACT nasal spray Place 2 sprays into both nostrils daily as needed for allergies.    gabapentin (NEURONTIN) 300 MG capsule Take 1 capsule (300 mg total) by mouth 2 (two) times daily as needed.   GLUCAGON EMERGENCY 1 MG injection Inject 1 mg into the  muscle daily as needed (low blood sugar).   Glucose-Cholecalciferol (RELION GLUCOSE SHOT) LIQD Take 1 Dose by mouth daily as needed (low blood sugar).   HYDROmorphone (DILAUDID) 2 MG tablet Take 1 tablet (2 mg total) by mouth daily as needed for severe pain.   Insulin Disposable Pump (OMNIPOD DASH PODS, GEN 4,) MISC Inject into the skin as directed.   insulin lispro (HUMALOG) 100 UNIT/ML injection Inject 75-120 Units into the skin continuous. Per insulin pump   levothyroxine (SYNTHROID, LEVOTHROID) 112 MCG tablet Take 112 mcg by mouth at bedtime.    loperamide (IMODIUM A-D) 2 MG tablet Take 2-4 mg by mouth 4 (four) times daily as needed for diarrhea or loose stools.   Melatonin 5 MG TABS Take 5 mg by mouth at bedtime.   Methoxy PEG-Epoetin Beta (MIRCERA IJ) as directed. dialysis center   mupirocin ointment (BACTROBAN) 2 % as needed.   omeprazole (PRILOSEC) 40 MG capsule Take 1 capsule by mouth 2 (two) times daily.   Polyethyl Glycol-Propyl Glycol (SYSTANE ULTRA PF OP) Place 1 drop into both eyes 4 (four) times daily as needed (dry eyes).   polyethylene glycol (MIRALAX / GLYCOLAX) 17 g packet Take 17 g by mouth daily as needed.   promethazine (PHENERGAN) 25 MG tablet Take 25 mg by mouth every 6 (six) hours as needed for nausea or vomiting.   rOPINIRole (REQUIP) 2 MG tablet Take 2 mg by mouth every evening.   sevelamer carbonate (RENVELA) 800 MG tablet Take 1,600 mg by mouth 3 (three) times daily with meals.   testosterone cypionate (DEPOTESTOSTERONE  CYPIONATE) 200 MG/ML injection Inject 200 mg into the muscle every 14 (fourteen) days.    triamcinolone cream (KENALOG) 0.1 % Apply 1 application topically as needed.   [DISCONTINUED] allopurinol (ZYLOPRIM) 100 MG tablet Take 1 tablet (100 mg total) by mouth every other day. (Patient taking differently: Take 200 mg by mouth daily.)   [DISCONTINUED] famotidine (PEPCID) 20 MG tablet Take 20 mg by mouth 2 (two) times daily.   [DISCONTINUED] ipratropium (ATROVENT) 0.03 % nasal spray Place 2 sprays into both nostrils every 12 (twelve) hours.   [DISCONTINUED] nitroGLYCERIN (NITROSTAT) 0.4 MG SL tablet Place 0.4 mg under the tongue every 5 (five) minutes as needed for chest pain.   [DISCONTINUED] prasugrel (EFFIENT) 10 MG TABS tablet Take 1 tablet (10 mg total) by mouth daily. Please keep upcoming appt in January 2022 before anymore refills. Thank you   [DISCONTINUED] triamcinolone (KENALOG) 0.1 % Apply 1 application topically 2 (two) times daily. (Patient taking differently: Apply 1 application topically as needed.)     Allergies:   Iodinated diagnostic agents, Prednisone, Adhesive [tape], Amoxicillin, Clindamycin/lincomycin, Enalapril, Mobic [meloxicam], Morphine, Brilinta [ticagrelor], Ciprofloxacin, Clopidogrel, Codeine, Doxycycline, Hydrocodone, Levofloxacin, Oxycodone, and Rocephin [ceftriaxone]   Social History   Tobacco Use   Smoking status: Never   Smokeless tobacco: Never  Vaping Use   Vaping Use: Never used  Substance Use Topics   Alcohol use: Yes    Comment: rare   Drug use: No     Family Hx: [name] family history includes Bone cancer in his maternal aunt; Breast cancer in his maternal aunt; Colon polyps in his father; Diabetes in his father and mother; Hyperlipidemia in his mother; Hypertension in his father and mother; Liver cancer in his maternal aunt. There is no history of Malignant hyperthermia, Heart attack, Stroke, Colon cancer, Stomach cancer, Pancreatic cancer, or  Esophageal cancer.  Review of Systems  Gastrointestinal:  Negative for hematochezia and  melena.    EKGs/Labs/Other Test Reviewed:    EKG:  EKG is   ordered today.  The ekg ordered today demonstrates normal sinus rhythm, HR 86, normal axis, no ST-TW changes, QTc 445 ms  Recent Labs: 05/20/2020: B Natriuretic Peptide 233.8 05/21/2020: TSH 3.016 05/25/2020: ALT 116; BUN 50; Creatinine, Ser 12.39; Hemoglobin 8.5; Platelets 276; Potassium 4.5; Sodium 134   Recent Lipid Panel Lab Results  Component Value Date/Time   CHOL 116 05/21/2020 09:45 AM   TRIG 118 05/21/2020 09:45 AM   HDL 32 (L) 05/21/2020 09:45 AM   LDLCALC 60 05/21/2020 09:45 AM    Labs obtained through care everywhere- personally reviewed and interpreted: 07/21/2020: ALT 4   Risk Assessment/Calculations:      Physical Exam:    VS:  BP 120/68   Pulse 86   Ht _0  (1.727 m)   Wt 239 lb 12.8 oz (108.8 kg)   SpO2 98%   BMI 36.46 kg/m     Wt Readings from Last 3 Encounters:  10/06/20 239 lb 12.8 oz (108.8 kg)  09/01/20 243 lb (110.2 kg)  08/18/20 247 lb 2.2 oz (112.1 kg)     Constitutional:      Appearance: Healthy appearance. Not in distress.  Pulmonary:     Breath sounds: No wheezing. No rales.  Cardiovascular:     Normal rate. Regular rhythm. Normal S1. Normal S2.      Murmurs: There is no murmur.  Edema:    Peripheral edema (R leg) absent.  Abdominal:     Palpations: Abdomen is soft.  Musculoskeletal:     Cervical back: Neck supple.     Left Lower Extremity: Left leg is amputated below knee. Skin:    General: Skin is warm and dry.  Neurological:     General: No focal deficit present.     Mental Status: Alert and oriented to person, place and time.         ASSESSMENT & PLAN:    1. Coronary artery disease involving native coronary artery of native heart without angina pectoris S/p DES to the RCA in 2015 and subsequent non-STEMI in 2017 treated with a DES to the diagonal.  He is doing well without  anginal symptoms.  He remains on aspirin, prasugrel, atorvastatin.  Follow-up in 6 months  2. Chronic heart failure with preserved ejection fraction (HCC) Volume management per dialysis.  His dry weight has been reduced and he has done significantly better since that time.  3. ESRD on dialysis [facility]) Monday, Wednesday, Friday dialysis.  4. Essential hypertension [name] blood pressure is controlled on his current regimen.  Continue current therapy.   5. Mixed hyperlipidemia LDL optimal on most recent lab work.  Continue current Rx.      Dispo:  Return in about 6 months (around 04/07/2021) for Routine Follow Up w/ Dr. [name].   Medication Adjustments/Labs and Tests Ordered: Current medicines are reviewed at length with the patient today.  Concerns regarding medicines are outlined above.  Tests Ordered: Orders Placed This Encounter  Procedures   EKG 12-Lead   Medication Changes: Meds ordered this encounter  Medications   prasugrel (EFFIENT) 10 MG TABS tablet    Sig: One tablet by mouth ( 10 mg ) daily.    Dispense:  90 tablet    Refill:  3   nitroGLYCERIN (NITROSTAT) 0.4 MG SL tablet    Sig: Place 1 tablet (0.4 mg total) under the tongue every 5 (five) minutes as needed for  chest pain.    Dispense:  25 tablet    Refill:  2    Signed, Richardson Dopp, PA-C  10/06/2020 12:41 PM    [facility] [address], [place], [place]  [address] Phone: [phone]; Fax: [phone]

## 2020-10-06 ENCOUNTER — Ambulatory Visit (INDEPENDENT_AMBULATORY_CARE_PROVIDER_SITE_OTHER): Payer: Medicare Other | Admitting: Physician Assistant

## 2020-10-06 ENCOUNTER — Other Ambulatory Visit: Payer: Self-pay

## 2020-10-06 ENCOUNTER — Encounter: Payer: Self-pay | Admitting: Physician Assistant

## 2020-10-06 VITALS — BP 120/68 | HR 86 | Ht 68.0 in | Wt 239.8 lb

## 2020-10-06 DIAGNOSIS — I5032 Chronic diastolic (congestive) heart failure: Secondary | ICD-10-CM | POA: Diagnosis not present

## 2020-10-06 DIAGNOSIS — I10 Essential (primary) hypertension: Secondary | ICD-10-CM

## 2020-10-06 DIAGNOSIS — N186 End stage renal disease: Secondary | ICD-10-CM

## 2020-10-06 DIAGNOSIS — E782 Mixed hyperlipidemia: Secondary | ICD-10-CM

## 2020-10-06 DIAGNOSIS — Z992 Dependence on renal dialysis: Secondary | ICD-10-CM

## 2020-10-06 DIAGNOSIS — I2510 Atherosclerotic heart disease of native coronary artery without angina pectoris: Secondary | ICD-10-CM

## 2020-10-06 MED ORDER — PRASUGREL HCL 10 MG PO TABS: ORAL_TABLET | ORAL | 3 refills | Status: AC

## 2020-10-06 MED ORDER — NITROGLYCERIN 0.4 MG SL SUBL: 0.4000 mg | SUBLINGUAL_TABLET | SUBLINGUAL | 2 refills | Status: AC | PRN

## 2020-10-06 NOTE — Patient Instructions (Signed)
Medication Instructions:  Your physician recommends that you continue on your current medications as directed. Please refer to the Current Medication list given to you today.  Cardiac medications were filled today for # 90 to requested pharmacy.    *If you need a refill on your cardiac medications before your next appointment, please call your pharmacy*   Lab Work:  -None     If you have labs (blood work) drawn today and your tests are completely normal, you will receive your results only by: [place] (if you have MyChart) OR A paper copy in the mail If you have any lab test that is abnormal or we need to change your treatment, we will call you to review the results.   Testing/Procedures:  -None   Follow-Up: At [facility], you and your health needs are our priority.  As part of our continuing mission to provide you with exceptional heart care, we have created designated Provider Care Teams.  These Care Teams include your primary Cardiologist (physician) and Advanced Practice Providers (APPs -  Physician Assistants and Nurse Practitioners) who all work together to provide you with the care you need, when you need it.  We recommend signing up for the patient portal called "MyChart".  Sign up information is provided on this After Visit Summary.  MyChart is used to connect with patients for Virtual Visits (Telemedicine).  Patients are able to view lab/test results, encounter notes, upcoming appointments, etc.  Non-urgent messages can be sent to your provider as well.   To learn more about what you can do with MyChart, go to [email or web address].    Your next appointment:   6 month(s)  The format for your next appointment:   In Person  Provider:   Lauree Chandler, MD   Other Instructions Your physician wants you to follow-up in: 6 months with Dr. [name].  You will receive a reminder letter in the mail two months in advance. If you don't receive a letter,  please call our office to schedule the follow-up appointment.

## 2020-10-07 DIAGNOSIS — N186 End stage renal disease: Secondary | ICD-10-CM | POA: Diagnosis not present

## 2020-10-07 DIAGNOSIS — Z992 Dependence on renal dialysis: Secondary | ICD-10-CM | POA: Diagnosis not present

## 2020-10-07 DIAGNOSIS — D689 Coagulation defect, unspecified: Secondary | ICD-10-CM | POA: Diagnosis not present

## 2020-10-07 DIAGNOSIS — T82818A Embolism of vascular prosthetic devices, implants and grafts, initial encounter: Secondary | ICD-10-CM | POA: Diagnosis not present

## 2020-10-07 DIAGNOSIS — E1022 Type 1 diabetes mellitus with diabetic chronic kidney disease: Secondary | ICD-10-CM | POA: Diagnosis not present

## 2020-10-07 DIAGNOSIS — D631 Anemia in chronic kidney disease: Secondary | ICD-10-CM | POA: Diagnosis not present

## 2020-10-07 DIAGNOSIS — N2581 Secondary hyperparathyroidism of renal origin: Secondary | ICD-10-CM | POA: Diagnosis not present

## 2020-10-07 DIAGNOSIS — D509 Iron deficiency anemia, unspecified: Secondary | ICD-10-CM | POA: Diagnosis not present

## 2020-10-09 DIAGNOSIS — N2581 Secondary hyperparathyroidism of renal origin: Secondary | ICD-10-CM | POA: Diagnosis not present

## 2020-10-09 DIAGNOSIS — D689 Coagulation defect, unspecified: Secondary | ICD-10-CM | POA: Diagnosis not present

## 2020-10-09 DIAGNOSIS — E1022 Type 1 diabetes mellitus with diabetic chronic kidney disease: Secondary | ICD-10-CM | POA: Diagnosis not present

## 2020-10-09 DIAGNOSIS — D631 Anemia in chronic kidney disease: Secondary | ICD-10-CM | POA: Diagnosis not present

## 2020-10-09 DIAGNOSIS — D509 Iron deficiency anemia, unspecified: Secondary | ICD-10-CM | POA: Diagnosis not present

## 2020-10-09 DIAGNOSIS — T82818A Embolism of vascular prosthetic devices, implants and grafts, initial encounter: Secondary | ICD-10-CM | POA: Diagnosis not present

## 2020-10-09 DIAGNOSIS — Z992 Dependence on renal dialysis: Secondary | ICD-10-CM | POA: Diagnosis not present

## 2020-10-09 DIAGNOSIS — N186 End stage renal disease: Secondary | ICD-10-CM | POA: Diagnosis not present

## 2020-10-12 DIAGNOSIS — D631 Anemia in chronic kidney disease: Secondary | ICD-10-CM | POA: Diagnosis not present

## 2020-10-12 DIAGNOSIS — T82818A Embolism of vascular prosthetic devices, implants and grafts, initial encounter: Secondary | ICD-10-CM | POA: Diagnosis not present

## 2020-10-12 DIAGNOSIS — D509 Iron deficiency anemia, unspecified: Secondary | ICD-10-CM | POA: Diagnosis not present

## 2020-10-12 DIAGNOSIS — Z992 Dependence on renal dialysis: Secondary | ICD-10-CM | POA: Diagnosis not present

## 2020-10-12 DIAGNOSIS — D689 Coagulation defect, unspecified: Secondary | ICD-10-CM | POA: Diagnosis not present

## 2020-10-12 DIAGNOSIS — N186 End stage renal disease: Secondary | ICD-10-CM | POA: Diagnosis not present

## 2020-10-12 DIAGNOSIS — N2581 Secondary hyperparathyroidism of renal origin: Secondary | ICD-10-CM | POA: Diagnosis not present

## 2020-10-14 DIAGNOSIS — N186 End stage renal disease: Secondary | ICD-10-CM | POA: Diagnosis not present

## 2020-10-14 DIAGNOSIS — T82818A Embolism of vascular prosthetic devices, implants and grafts, initial encounter: Secondary | ICD-10-CM | POA: Diagnosis not present

## 2020-10-14 DIAGNOSIS — D689 Coagulation defect, unspecified: Secondary | ICD-10-CM | POA: Diagnosis not present

## 2020-10-14 DIAGNOSIS — D631 Anemia in chronic kidney disease: Secondary | ICD-10-CM | POA: Diagnosis not present

## 2020-10-14 DIAGNOSIS — N2581 Secondary hyperparathyroidism of renal origin: Secondary | ICD-10-CM | POA: Diagnosis not present

## 2020-10-14 DIAGNOSIS — D509 Iron deficiency anemia, unspecified: Secondary | ICD-10-CM | POA: Diagnosis not present

## 2020-10-14 DIAGNOSIS — Z992 Dependence on renal dialysis: Secondary | ICD-10-CM | POA: Diagnosis not present

## 2020-10-15 DIAGNOSIS — H5212 Myopia, left eye: Secondary | ICD-10-CM | POA: Diagnosis not present

## 2020-10-17 DIAGNOSIS — N186 End stage renal disease: Secondary | ICD-10-CM | POA: Diagnosis not present

## 2020-10-17 DIAGNOSIS — D689 Coagulation defect, unspecified: Secondary | ICD-10-CM | POA: Diagnosis not present

## 2020-10-17 DIAGNOSIS — N2581 Secondary hyperparathyroidism of renal origin: Secondary | ICD-10-CM | POA: Diagnosis not present

## 2020-10-17 DIAGNOSIS — Z992 Dependence on renal dialysis: Secondary | ICD-10-CM | POA: Diagnosis not present

## 2020-10-17 DIAGNOSIS — D509 Iron deficiency anemia, unspecified: Secondary | ICD-10-CM | POA: Diagnosis not present

## 2020-10-17 DIAGNOSIS — D631 Anemia in chronic kidney disease: Secondary | ICD-10-CM | POA: Diagnosis not present

## 2020-10-17 DIAGNOSIS — T82818A Embolism of vascular prosthetic devices, implants and grafts, initial encounter: Secondary | ICD-10-CM | POA: Diagnosis not present

## 2020-10-19 DIAGNOSIS — Z992 Dependence on renal dialysis: Secondary | ICD-10-CM | POA: Diagnosis not present

## 2020-10-19 DIAGNOSIS — N186 End stage renal disease: Secondary | ICD-10-CM | POA: Diagnosis not present

## 2020-10-19 DIAGNOSIS — N2581 Secondary hyperparathyroidism of renal origin: Secondary | ICD-10-CM | POA: Diagnosis not present

## 2020-10-19 DIAGNOSIS — T82818A Embolism of vascular prosthetic devices, implants and grafts, initial encounter: Secondary | ICD-10-CM | POA: Diagnosis not present

## 2020-10-19 DIAGNOSIS — D509 Iron deficiency anemia, unspecified: Secondary | ICD-10-CM | POA: Diagnosis not present

## 2020-10-19 DIAGNOSIS — D689 Coagulation defect, unspecified: Secondary | ICD-10-CM | POA: Diagnosis not present

## 2020-10-21 DIAGNOSIS — T82818A Embolism of vascular prosthetic devices, implants and grafts, initial encounter: Secondary | ICD-10-CM | POA: Diagnosis not present

## 2020-10-21 DIAGNOSIS — D509 Iron deficiency anemia, unspecified: Secondary | ICD-10-CM | POA: Diagnosis not present

## 2020-10-21 DIAGNOSIS — N2581 Secondary hyperparathyroidism of renal origin: Secondary | ICD-10-CM | POA: Diagnosis not present

## 2020-10-21 DIAGNOSIS — D689 Coagulation defect, unspecified: Secondary | ICD-10-CM | POA: Diagnosis not present

## 2020-10-21 DIAGNOSIS — Z992 Dependence on renal dialysis: Secondary | ICD-10-CM | POA: Diagnosis not present

## 2020-10-21 DIAGNOSIS — N186 End stage renal disease: Secondary | ICD-10-CM | POA: Diagnosis not present

## 2020-10-22 DIAGNOSIS — Z992 Dependence on renal dialysis: Secondary | ICD-10-CM | POA: Diagnosis not present

## 2020-10-22 DIAGNOSIS — N186 End stage renal disease: Secondary | ICD-10-CM | POA: Diagnosis not present

## 2020-10-22 DIAGNOSIS — T8612 Kidney transplant failure: Secondary | ICD-10-CM | POA: Diagnosis not present

## 2020-10-23 ENCOUNTER — Ambulatory Visit: Payer: Medicare Other | Admitting: Internal Medicine

## 2020-10-23 DIAGNOSIS — D689 Coagulation defect, unspecified: Secondary | ICD-10-CM | POA: Diagnosis not present

## 2020-10-23 DIAGNOSIS — N2581 Secondary hyperparathyroidism of renal origin: Secondary | ICD-10-CM | POA: Diagnosis not present

## 2020-10-23 DIAGNOSIS — Z992 Dependence on renal dialysis: Secondary | ICD-10-CM | POA: Diagnosis not present

## 2020-10-23 DIAGNOSIS — N186 End stage renal disease: Secondary | ICD-10-CM | POA: Diagnosis not present

## 2020-10-23 DIAGNOSIS — T82818A Embolism of vascular prosthetic devices, implants and grafts, initial encounter: Secondary | ICD-10-CM | POA: Diagnosis not present

## 2020-10-26 DIAGNOSIS — Z992 Dependence on renal dialysis: Secondary | ICD-10-CM | POA: Diagnosis not present

## 2020-10-26 DIAGNOSIS — D689 Coagulation defect, unspecified: Secondary | ICD-10-CM | POA: Diagnosis not present

## 2020-10-26 DIAGNOSIS — N2581 Secondary hyperparathyroidism of renal origin: Secondary | ICD-10-CM | POA: Diagnosis not present

## 2020-10-26 DIAGNOSIS — N186 End stage renal disease: Secondary | ICD-10-CM | POA: Diagnosis not present

## 2020-10-26 DIAGNOSIS — T82818A Embolism of vascular prosthetic devices, implants and grafts, initial encounter: Secondary | ICD-10-CM | POA: Diagnosis not present

## 2020-10-28 DIAGNOSIS — D689 Coagulation defect, unspecified: Secondary | ICD-10-CM | POA: Diagnosis not present

## 2020-10-28 DIAGNOSIS — T82818A Embolism of vascular prosthetic devices, implants and grafts, initial encounter: Secondary | ICD-10-CM | POA: Diagnosis not present

## 2020-10-28 DIAGNOSIS — N2581 Secondary hyperparathyroidism of renal origin: Secondary | ICD-10-CM | POA: Diagnosis not present

## 2020-10-28 DIAGNOSIS — Z992 Dependence on renal dialysis: Secondary | ICD-10-CM | POA: Diagnosis not present

## 2020-10-28 DIAGNOSIS — N186 End stage renal disease: Secondary | ICD-10-CM | POA: Diagnosis not present

## 2020-10-30 DIAGNOSIS — N186 End stage renal disease: Secondary | ICD-10-CM | POA: Diagnosis not present

## 2020-10-30 DIAGNOSIS — D689 Coagulation defect, unspecified: Secondary | ICD-10-CM | POA: Diagnosis not present

## 2020-10-30 DIAGNOSIS — Z992 Dependence on renal dialysis: Secondary | ICD-10-CM | POA: Diagnosis not present

## 2020-10-30 DIAGNOSIS — N2581 Secondary hyperparathyroidism of renal origin: Secondary | ICD-10-CM | POA: Diagnosis not present

## 2020-10-30 DIAGNOSIS — E108 Type 1 diabetes mellitus with unspecified complications: Secondary | ICD-10-CM | POA: Diagnosis not present

## 2020-10-30 DIAGNOSIS — T82818A Embolism of vascular prosthetic devices, implants and grafts, initial encounter: Secondary | ICD-10-CM | POA: Diagnosis not present

## 2020-11-02 DIAGNOSIS — N186 End stage renal disease: Secondary | ICD-10-CM | POA: Diagnosis not present

## 2020-11-02 DIAGNOSIS — D689 Coagulation defect, unspecified: Secondary | ICD-10-CM | POA: Diagnosis not present

## 2020-11-02 DIAGNOSIS — N2581 Secondary hyperparathyroidism of renal origin: Secondary | ICD-10-CM | POA: Diagnosis not present

## 2020-11-02 DIAGNOSIS — T82818A Embolism of vascular prosthetic devices, implants and grafts, initial encounter: Secondary | ICD-10-CM | POA: Diagnosis not present

## 2020-11-02 DIAGNOSIS — D631 Anemia in chronic kidney disease: Secondary | ICD-10-CM | POA: Diagnosis not present

## 2020-11-02 DIAGNOSIS — Z992 Dependence on renal dialysis: Secondary | ICD-10-CM | POA: Diagnosis not present

## 2020-11-04 DIAGNOSIS — T82818A Embolism of vascular prosthetic devices, implants and grafts, initial encounter: Secondary | ICD-10-CM | POA: Diagnosis not present

## 2020-11-04 DIAGNOSIS — D631 Anemia in chronic kidney disease: Secondary | ICD-10-CM | POA: Diagnosis not present

## 2020-11-04 DIAGNOSIS — N2581 Secondary hyperparathyroidism of renal origin: Secondary | ICD-10-CM | POA: Diagnosis not present

## 2020-11-04 DIAGNOSIS — N186 End stage renal disease: Secondary | ICD-10-CM | POA: Diagnosis not present

## 2020-11-04 DIAGNOSIS — D689 Coagulation defect, unspecified: Secondary | ICD-10-CM | POA: Diagnosis not present

## 2020-11-04 DIAGNOSIS — Z992 Dependence on renal dialysis: Secondary | ICD-10-CM | POA: Diagnosis not present

## 2020-11-06 DIAGNOSIS — T82818A Embolism of vascular prosthetic devices, implants and grafts, initial encounter: Secondary | ICD-10-CM | POA: Diagnosis not present

## 2020-11-06 DIAGNOSIS — D689 Coagulation defect, unspecified: Secondary | ICD-10-CM | POA: Diagnosis not present

## 2020-11-06 DIAGNOSIS — N2581 Secondary hyperparathyroidism of renal origin: Secondary | ICD-10-CM | POA: Diagnosis not present

## 2020-11-06 DIAGNOSIS — N186 End stage renal disease: Secondary | ICD-10-CM | POA: Diagnosis not present

## 2020-11-06 DIAGNOSIS — Z992 Dependence on renal dialysis: Secondary | ICD-10-CM | POA: Diagnosis not present

## 2020-11-06 DIAGNOSIS — D631 Anemia in chronic kidney disease: Secondary | ICD-10-CM | POA: Diagnosis not present

## 2020-11-09 DIAGNOSIS — D689 Coagulation defect, unspecified: Secondary | ICD-10-CM | POA: Diagnosis not present

## 2020-11-09 DIAGNOSIS — E1022 Type 1 diabetes mellitus with diabetic chronic kidney disease: Secondary | ICD-10-CM | POA: Diagnosis not present

## 2020-11-09 DIAGNOSIS — N186 End stage renal disease: Secondary | ICD-10-CM | POA: Diagnosis not present

## 2020-11-09 DIAGNOSIS — D509 Iron deficiency anemia, unspecified: Secondary | ICD-10-CM | POA: Diagnosis not present

## 2020-11-09 DIAGNOSIS — D631 Anemia in chronic kidney disease: Secondary | ICD-10-CM | POA: Diagnosis not present

## 2020-11-09 DIAGNOSIS — N2581 Secondary hyperparathyroidism of renal origin: Secondary | ICD-10-CM | POA: Diagnosis not present

## 2020-11-09 DIAGNOSIS — T82818A Embolism of vascular prosthetic devices, implants and grafts, initial encounter: Secondary | ICD-10-CM | POA: Diagnosis not present

## 2020-11-09 DIAGNOSIS — Z992 Dependence on renal dialysis: Secondary | ICD-10-CM | POA: Diagnosis not present

## 2020-11-11 DIAGNOSIS — D689 Coagulation defect, unspecified: Secondary | ICD-10-CM | POA: Diagnosis not present

## 2020-11-11 DIAGNOSIS — N2581 Secondary hyperparathyroidism of renal origin: Secondary | ICD-10-CM | POA: Diagnosis not present

## 2020-11-11 DIAGNOSIS — Z992 Dependence on renal dialysis: Secondary | ICD-10-CM | POA: Diagnosis not present

## 2020-11-11 DIAGNOSIS — D509 Iron deficiency anemia, unspecified: Secondary | ICD-10-CM | POA: Diagnosis not present

## 2020-11-11 DIAGNOSIS — E1022 Type 1 diabetes mellitus with diabetic chronic kidney disease: Secondary | ICD-10-CM | POA: Diagnosis not present

## 2020-11-11 DIAGNOSIS — D631 Anemia in chronic kidney disease: Secondary | ICD-10-CM | POA: Diagnosis not present

## 2020-11-11 DIAGNOSIS — T82818A Embolism of vascular prosthetic devices, implants and grafts, initial encounter: Secondary | ICD-10-CM | POA: Diagnosis not present

## 2020-11-11 DIAGNOSIS — N186 End stage renal disease: Secondary | ICD-10-CM | POA: Diagnosis not present

## 2020-11-12 DIAGNOSIS — Z79899 Other long term (current) drug therapy: Secondary | ICD-10-CM | POA: Diagnosis not present

## 2020-11-12 DIAGNOSIS — M2550 Pain in unspecified joint: Secondary | ICD-10-CM | POA: Diagnosis not present

## 2020-11-12 DIAGNOSIS — M1711 Unilateral primary osteoarthritis, right knee: Secondary | ICD-10-CM | POA: Diagnosis not present

## 2020-11-13 DIAGNOSIS — D509 Iron deficiency anemia, unspecified: Secondary | ICD-10-CM | POA: Diagnosis not present

## 2020-11-13 DIAGNOSIS — Z992 Dependence on renal dialysis: Secondary | ICD-10-CM | POA: Diagnosis not present

## 2020-11-13 DIAGNOSIS — N186 End stage renal disease: Secondary | ICD-10-CM | POA: Diagnosis not present

## 2020-11-13 DIAGNOSIS — D631 Anemia in chronic kidney disease: Secondary | ICD-10-CM | POA: Diagnosis not present

## 2020-11-13 DIAGNOSIS — N2581 Secondary hyperparathyroidism of renal origin: Secondary | ICD-10-CM | POA: Diagnosis not present

## 2020-11-13 DIAGNOSIS — D689 Coagulation defect, unspecified: Secondary | ICD-10-CM | POA: Diagnosis not present

## 2020-11-13 DIAGNOSIS — T82818A Embolism of vascular prosthetic devices, implants and grafts, initial encounter: Secondary | ICD-10-CM | POA: Diagnosis not present

## 2020-11-13 DIAGNOSIS — E1022 Type 1 diabetes mellitus with diabetic chronic kidney disease: Secondary | ICD-10-CM | POA: Diagnosis not present

## 2020-11-16 DIAGNOSIS — S90424A Blister (nonthermal), right lesser toe(s), initial encounter: Secondary | ICD-10-CM | POA: Diagnosis not present

## 2020-11-16 DIAGNOSIS — D509 Iron deficiency anemia, unspecified: Secondary | ICD-10-CM | POA: Diagnosis not present

## 2020-11-16 DIAGNOSIS — L603 Nail dystrophy: Secondary | ICD-10-CM | POA: Diagnosis not present

## 2020-11-16 DIAGNOSIS — E1022 Type 1 diabetes mellitus with diabetic chronic kidney disease: Secondary | ICD-10-CM | POA: Diagnosis not present

## 2020-11-16 DIAGNOSIS — D689 Coagulation defect, unspecified: Secondary | ICD-10-CM | POA: Diagnosis not present

## 2020-11-16 DIAGNOSIS — N2581 Secondary hyperparathyroidism of renal origin: Secondary | ICD-10-CM | POA: Diagnosis not present

## 2020-11-16 DIAGNOSIS — N186 End stage renal disease: Secondary | ICD-10-CM | POA: Diagnosis not present

## 2020-11-16 DIAGNOSIS — Z992 Dependence on renal dialysis: Secondary | ICD-10-CM | POA: Diagnosis not present

## 2020-11-16 DIAGNOSIS — D631 Anemia in chronic kidney disease: Secondary | ICD-10-CM | POA: Diagnosis not present

## 2020-11-16 DIAGNOSIS — T82818A Embolism of vascular prosthetic devices, implants and grafts, initial encounter: Secondary | ICD-10-CM | POA: Diagnosis not present

## 2020-11-17 DIAGNOSIS — E103551 Type 1 diabetes mellitus with stable proliferative diabetic retinopathy, right eye: Secondary | ICD-10-CM | POA: Diagnosis not present

## 2020-11-17 DIAGNOSIS — Z794 Long term (current) use of insulin: Secondary | ICD-10-CM | POA: Diagnosis not present

## 2020-11-17 DIAGNOSIS — H35043 Retinal micro-aneurysms, unspecified, bilateral: Secondary | ICD-10-CM | POA: Diagnosis not present

## 2020-11-17 DIAGNOSIS — E103532 Type 1 diabetes mellitus with proliferative diabetic retinopathy with traction retinal detachment not involving the macula, left eye: Secondary | ICD-10-CM | POA: Diagnosis not present

## 2020-11-18 DIAGNOSIS — D689 Coagulation defect, unspecified: Secondary | ICD-10-CM | POA: Diagnosis not present

## 2020-11-18 DIAGNOSIS — T82818A Embolism of vascular prosthetic devices, implants and grafts, initial encounter: Secondary | ICD-10-CM | POA: Diagnosis not present

## 2020-11-18 DIAGNOSIS — E1022 Type 1 diabetes mellitus with diabetic chronic kidney disease: Secondary | ICD-10-CM | POA: Diagnosis not present

## 2020-11-18 DIAGNOSIS — D631 Anemia in chronic kidney disease: Secondary | ICD-10-CM | POA: Diagnosis not present

## 2020-11-18 DIAGNOSIS — N2581 Secondary hyperparathyroidism of renal origin: Secondary | ICD-10-CM | POA: Diagnosis not present

## 2020-11-18 DIAGNOSIS — N186 End stage renal disease: Secondary | ICD-10-CM | POA: Diagnosis not present

## 2020-11-18 DIAGNOSIS — D509 Iron deficiency anemia, unspecified: Secondary | ICD-10-CM | POA: Diagnosis not present

## 2020-11-18 DIAGNOSIS — Z992 Dependence on renal dialysis: Secondary | ICD-10-CM | POA: Diagnosis not present

## 2020-11-20 DIAGNOSIS — N2581 Secondary hyperparathyroidism of renal origin: Secondary | ICD-10-CM | POA: Diagnosis not present

## 2020-11-20 DIAGNOSIS — N186 End stage renal disease: Secondary | ICD-10-CM | POA: Diagnosis not present

## 2020-11-20 DIAGNOSIS — T82818A Embolism of vascular prosthetic devices, implants and grafts, initial encounter: Secondary | ICD-10-CM | POA: Diagnosis not present

## 2020-11-20 DIAGNOSIS — D509 Iron deficiency anemia, unspecified: Secondary | ICD-10-CM | POA: Diagnosis not present

## 2020-11-20 DIAGNOSIS — Z992 Dependence on renal dialysis: Secondary | ICD-10-CM | POA: Diagnosis not present

## 2020-11-20 DIAGNOSIS — D689 Coagulation defect, unspecified: Secondary | ICD-10-CM | POA: Diagnosis not present

## 2020-11-20 DIAGNOSIS — D631 Anemia in chronic kidney disease: Secondary | ICD-10-CM | POA: Diagnosis not present

## 2020-11-20 DIAGNOSIS — E1022 Type 1 diabetes mellitus with diabetic chronic kidney disease: Secondary | ICD-10-CM | POA: Diagnosis not present

## 2020-11-22 DIAGNOSIS — Z992 Dependence on renal dialysis: Secondary | ICD-10-CM | POA: Diagnosis not present

## 2020-11-22 DIAGNOSIS — T8612 Kidney transplant failure: Secondary | ICD-10-CM | POA: Diagnosis not present

## 2020-11-22 DIAGNOSIS — N186 End stage renal disease: Secondary | ICD-10-CM | POA: Diagnosis not present

## 2020-11-23 DIAGNOSIS — D631 Anemia in chronic kidney disease: Secondary | ICD-10-CM | POA: Diagnosis not present

## 2020-11-23 DIAGNOSIS — D689 Coagulation defect, unspecified: Secondary | ICD-10-CM | POA: Diagnosis not present

## 2020-11-23 DIAGNOSIS — T82818A Embolism of vascular prosthetic devices, implants and grafts, initial encounter: Secondary | ICD-10-CM | POA: Diagnosis not present

## 2020-11-23 DIAGNOSIS — N2581 Secondary hyperparathyroidism of renal origin: Secondary | ICD-10-CM | POA: Diagnosis not present

## 2020-11-23 DIAGNOSIS — D509 Iron deficiency anemia, unspecified: Secondary | ICD-10-CM | POA: Diagnosis not present

## 2020-11-23 DIAGNOSIS — E1022 Type 1 diabetes mellitus with diabetic chronic kidney disease: Secondary | ICD-10-CM | POA: Diagnosis not present

## 2020-11-23 DIAGNOSIS — N186 End stage renal disease: Secondary | ICD-10-CM | POA: Diagnosis not present

## 2020-11-23 DIAGNOSIS — Z992 Dependence on renal dialysis: Secondary | ICD-10-CM | POA: Diagnosis not present

## 2020-11-24 DIAGNOSIS — Z79890 Hormone replacement therapy (postmenopausal): Secondary | ICD-10-CM | POA: Diagnosis not present

## 2020-11-24 DIAGNOSIS — E108 Type 1 diabetes mellitus with unspecified complications: Secondary | ICD-10-CM | POA: Diagnosis not present

## 2020-11-24 DIAGNOSIS — E291 Testicular hypofunction: Secondary | ICD-10-CM | POA: Diagnosis not present

## 2020-11-24 DIAGNOSIS — E039 Hypothyroidism, unspecified: Secondary | ICD-10-CM | POA: Diagnosis not present

## 2020-11-25 DIAGNOSIS — E1022 Type 1 diabetes mellitus with diabetic chronic kidney disease: Secondary | ICD-10-CM | POA: Diagnosis not present

## 2020-11-25 DIAGNOSIS — D631 Anemia in chronic kidney disease: Secondary | ICD-10-CM | POA: Diagnosis not present

## 2020-11-25 DIAGNOSIS — D509 Iron deficiency anemia, unspecified: Secondary | ICD-10-CM | POA: Diagnosis not present

## 2020-11-25 DIAGNOSIS — Z992 Dependence on renal dialysis: Secondary | ICD-10-CM | POA: Diagnosis not present

## 2020-11-25 DIAGNOSIS — D689 Coagulation defect, unspecified: Secondary | ICD-10-CM | POA: Diagnosis not present

## 2020-11-25 DIAGNOSIS — T82818A Embolism of vascular prosthetic devices, implants and grafts, initial encounter: Secondary | ICD-10-CM | POA: Diagnosis not present

## 2020-11-25 DIAGNOSIS — N186 End stage renal disease: Secondary | ICD-10-CM | POA: Diagnosis not present

## 2020-11-25 DIAGNOSIS — N2581 Secondary hyperparathyroidism of renal origin: Secondary | ICD-10-CM | POA: Diagnosis not present

## 2020-11-26 DIAGNOSIS — E103593 Type 1 diabetes mellitus with proliferative diabetic retinopathy without macular edema, bilateral: Secondary | ICD-10-CM | POA: Diagnosis not present

## 2020-11-26 DIAGNOSIS — E1022 Type 1 diabetes mellitus with diabetic chronic kidney disease: Secondary | ICD-10-CM | POA: Diagnosis not present

## 2020-11-26 DIAGNOSIS — E1065 Type 1 diabetes mellitus with hyperglycemia: Secondary | ICD-10-CM | POA: Diagnosis not present

## 2020-11-26 DIAGNOSIS — E1051 Type 1 diabetes mellitus with diabetic peripheral angiopathy without gangrene: Secondary | ICD-10-CM | POA: Diagnosis not present

## 2020-11-26 DIAGNOSIS — E10649 Type 1 diabetes mellitus with hypoglycemia without coma: Secondary | ICD-10-CM | POA: Diagnosis not present

## 2020-11-26 DIAGNOSIS — E108 Type 1 diabetes mellitus with unspecified complications: Secondary | ICD-10-CM | POA: Diagnosis not present

## 2020-11-26 DIAGNOSIS — E1059 Type 1 diabetes mellitus with other circulatory complications: Secondary | ICD-10-CM | POA: Diagnosis not present

## 2020-11-26 DIAGNOSIS — E785 Hyperlipidemia, unspecified: Secondary | ICD-10-CM | POA: Diagnosis not present

## 2020-11-26 DIAGNOSIS — Z9641 Presence of insulin pump (external) (internal): Secondary | ICD-10-CM | POA: Diagnosis not present

## 2020-11-26 DIAGNOSIS — E1040 Type 1 diabetes mellitus with diabetic neuropathy, unspecified: Secondary | ICD-10-CM | POA: Diagnosis not present

## 2020-11-26 DIAGNOSIS — N186 End stage renal disease: Secondary | ICD-10-CM | POA: Diagnosis not present

## 2020-11-26 DIAGNOSIS — E039 Hypothyroidism, unspecified: Secondary | ICD-10-CM | POA: Diagnosis not present

## 2020-11-26 DIAGNOSIS — I152 Hypertension secondary to endocrine disorders: Secondary | ICD-10-CM | POA: Diagnosis not present

## 2020-11-26 DIAGNOSIS — E1069 Type 1 diabetes mellitus with other specified complication: Secondary | ICD-10-CM | POA: Diagnosis not present

## 2020-11-27 DIAGNOSIS — D689 Coagulation defect, unspecified: Secondary | ICD-10-CM | POA: Diagnosis not present

## 2020-11-27 DIAGNOSIS — D509 Iron deficiency anemia, unspecified: Secondary | ICD-10-CM | POA: Diagnosis not present

## 2020-11-27 DIAGNOSIS — Z992 Dependence on renal dialysis: Secondary | ICD-10-CM | POA: Diagnosis not present

## 2020-11-27 DIAGNOSIS — E1022 Type 1 diabetes mellitus with diabetic chronic kidney disease: Secondary | ICD-10-CM | POA: Diagnosis not present

## 2020-11-27 DIAGNOSIS — D631 Anemia in chronic kidney disease: Secondary | ICD-10-CM | POA: Diagnosis not present

## 2020-11-27 DIAGNOSIS — N2581 Secondary hyperparathyroidism of renal origin: Secondary | ICD-10-CM | POA: Diagnosis not present

## 2020-11-27 DIAGNOSIS — N186 End stage renal disease: Secondary | ICD-10-CM | POA: Diagnosis not present

## 2020-11-27 DIAGNOSIS — T82818A Embolism of vascular prosthetic devices, implants and grafts, initial encounter: Secondary | ICD-10-CM | POA: Diagnosis not present

## 2020-11-29 DIAGNOSIS — E108 Type 1 diabetes mellitus with unspecified complications: Secondary | ICD-10-CM | POA: Diagnosis not present

## 2020-11-30 DIAGNOSIS — T82818A Embolism of vascular prosthetic devices, implants and grafts, initial encounter: Secondary | ICD-10-CM | POA: Diagnosis not present

## 2020-11-30 DIAGNOSIS — Z992 Dependence on renal dialysis: Secondary | ICD-10-CM | POA: Diagnosis not present

## 2020-11-30 DIAGNOSIS — N2581 Secondary hyperparathyroidism of renal origin: Secondary | ICD-10-CM | POA: Diagnosis not present

## 2020-11-30 DIAGNOSIS — D631 Anemia in chronic kidney disease: Secondary | ICD-10-CM | POA: Diagnosis not present

## 2020-11-30 DIAGNOSIS — D509 Iron deficiency anemia, unspecified: Secondary | ICD-10-CM | POA: Diagnosis not present

## 2020-11-30 DIAGNOSIS — E1022 Type 1 diabetes mellitus with diabetic chronic kidney disease: Secondary | ICD-10-CM | POA: Diagnosis not present

## 2020-11-30 DIAGNOSIS — N186 End stage renal disease: Secondary | ICD-10-CM | POA: Diagnosis not present

## 2020-11-30 DIAGNOSIS — D689 Coagulation defect, unspecified: Secondary | ICD-10-CM | POA: Diagnosis not present

## 2020-12-02 DIAGNOSIS — E1022 Type 1 diabetes mellitus with diabetic chronic kidney disease: Secondary | ICD-10-CM | POA: Diagnosis not present

## 2020-12-02 DIAGNOSIS — T82818A Embolism of vascular prosthetic devices, implants and grafts, initial encounter: Secondary | ICD-10-CM | POA: Diagnosis not present

## 2020-12-02 DIAGNOSIS — N186 End stage renal disease: Secondary | ICD-10-CM | POA: Diagnosis not present

## 2020-12-02 DIAGNOSIS — D509 Iron deficiency anemia, unspecified: Secondary | ICD-10-CM | POA: Diagnosis not present

## 2020-12-02 DIAGNOSIS — D631 Anemia in chronic kidney disease: Secondary | ICD-10-CM | POA: Diagnosis not present

## 2020-12-02 DIAGNOSIS — N2581 Secondary hyperparathyroidism of renal origin: Secondary | ICD-10-CM | POA: Diagnosis not present

## 2020-12-02 DIAGNOSIS — Z992 Dependence on renal dialysis: Secondary | ICD-10-CM | POA: Diagnosis not present

## 2020-12-02 DIAGNOSIS — D689 Coagulation defect, unspecified: Secondary | ICD-10-CM | POA: Diagnosis not present

## 2020-12-04 DIAGNOSIS — T82818A Embolism of vascular prosthetic devices, implants and grafts, initial encounter: Secondary | ICD-10-CM | POA: Diagnosis not present

## 2020-12-04 DIAGNOSIS — E1022 Type 1 diabetes mellitus with diabetic chronic kidney disease: Secondary | ICD-10-CM | POA: Diagnosis not present

## 2020-12-04 DIAGNOSIS — D689 Coagulation defect, unspecified: Secondary | ICD-10-CM | POA: Diagnosis not present

## 2020-12-04 DIAGNOSIS — N186 End stage renal disease: Secondary | ICD-10-CM | POA: Diagnosis not present

## 2020-12-04 DIAGNOSIS — Z992 Dependence on renal dialysis: Secondary | ICD-10-CM | POA: Diagnosis not present

## 2020-12-04 DIAGNOSIS — N2581 Secondary hyperparathyroidism of renal origin: Secondary | ICD-10-CM | POA: Diagnosis not present

## 2020-12-04 DIAGNOSIS — D509 Iron deficiency anemia, unspecified: Secondary | ICD-10-CM | POA: Diagnosis not present

## 2020-12-04 DIAGNOSIS — D631 Anemia in chronic kidney disease: Secondary | ICD-10-CM | POA: Diagnosis not present

## 2020-12-07 DIAGNOSIS — T82818A Embolism of vascular prosthetic devices, implants and grafts, initial encounter: Secondary | ICD-10-CM | POA: Diagnosis not present

## 2020-12-07 DIAGNOSIS — D631 Anemia in chronic kidney disease: Secondary | ICD-10-CM | POA: Diagnosis not present

## 2020-12-07 DIAGNOSIS — N2581 Secondary hyperparathyroidism of renal origin: Secondary | ICD-10-CM | POA: Diagnosis not present

## 2020-12-07 DIAGNOSIS — D509 Iron deficiency anemia, unspecified: Secondary | ICD-10-CM | POA: Diagnosis not present

## 2020-12-07 DIAGNOSIS — N186 End stage renal disease: Secondary | ICD-10-CM | POA: Diagnosis not present

## 2020-12-07 DIAGNOSIS — Z992 Dependence on renal dialysis: Secondary | ICD-10-CM | POA: Diagnosis not present

## 2020-12-07 DIAGNOSIS — D689 Coagulation defect, unspecified: Secondary | ICD-10-CM | POA: Diagnosis not present

## 2020-12-07 DIAGNOSIS — E1022 Type 1 diabetes mellitus with diabetic chronic kidney disease: Secondary | ICD-10-CM | POA: Diagnosis not present

## 2020-12-09 DIAGNOSIS — D689 Coagulation defect, unspecified: Secondary | ICD-10-CM | POA: Diagnosis not present

## 2020-12-09 DIAGNOSIS — N2581 Secondary hyperparathyroidism of renal origin: Secondary | ICD-10-CM | POA: Diagnosis not present

## 2020-12-09 DIAGNOSIS — D509 Iron deficiency anemia, unspecified: Secondary | ICD-10-CM | POA: Diagnosis not present

## 2020-12-09 DIAGNOSIS — T82818A Embolism of vascular prosthetic devices, implants and grafts, initial encounter: Secondary | ICD-10-CM | POA: Diagnosis not present

## 2020-12-09 DIAGNOSIS — N186 End stage renal disease: Secondary | ICD-10-CM | POA: Diagnosis not present

## 2020-12-09 DIAGNOSIS — D631 Anemia in chronic kidney disease: Secondary | ICD-10-CM | POA: Diagnosis not present

## 2020-12-09 DIAGNOSIS — Z992 Dependence on renal dialysis: Secondary | ICD-10-CM | POA: Diagnosis not present

## 2020-12-09 DIAGNOSIS — E1022 Type 1 diabetes mellitus with diabetic chronic kidney disease: Secondary | ICD-10-CM | POA: Diagnosis not present

## 2020-12-11 DIAGNOSIS — Z992 Dependence on renal dialysis: Secondary | ICD-10-CM | POA: Diagnosis not present

## 2020-12-11 DIAGNOSIS — T82818A Embolism of vascular prosthetic devices, implants and grafts, initial encounter: Secondary | ICD-10-CM | POA: Diagnosis not present

## 2020-12-11 DIAGNOSIS — E1022 Type 1 diabetes mellitus with diabetic chronic kidney disease: Secondary | ICD-10-CM | POA: Diagnosis not present

## 2020-12-11 DIAGNOSIS — D509 Iron deficiency anemia, unspecified: Secondary | ICD-10-CM | POA: Diagnosis not present

## 2020-12-11 DIAGNOSIS — N186 End stage renal disease: Secondary | ICD-10-CM | POA: Diagnosis not present

## 2020-12-11 DIAGNOSIS — D689 Coagulation defect, unspecified: Secondary | ICD-10-CM | POA: Diagnosis not present

## 2020-12-11 DIAGNOSIS — N2581 Secondary hyperparathyroidism of renal origin: Secondary | ICD-10-CM | POA: Diagnosis not present

## 2020-12-11 DIAGNOSIS — D631 Anemia in chronic kidney disease: Secondary | ICD-10-CM | POA: Diagnosis not present

## 2020-12-14 DIAGNOSIS — Z992 Dependence on renal dialysis: Secondary | ICD-10-CM | POA: Diagnosis not present

## 2020-12-14 DIAGNOSIS — D631 Anemia in chronic kidney disease: Secondary | ICD-10-CM | POA: Diagnosis not present

## 2020-12-14 DIAGNOSIS — D689 Coagulation defect, unspecified: Secondary | ICD-10-CM | POA: Diagnosis not present

## 2020-12-14 DIAGNOSIS — E1022 Type 1 diabetes mellitus with diabetic chronic kidney disease: Secondary | ICD-10-CM | POA: Diagnosis not present

## 2020-12-14 DIAGNOSIS — N2581 Secondary hyperparathyroidism of renal origin: Secondary | ICD-10-CM | POA: Diagnosis not present

## 2020-12-14 DIAGNOSIS — D509 Iron deficiency anemia, unspecified: Secondary | ICD-10-CM | POA: Diagnosis not present

## 2020-12-14 DIAGNOSIS — T82818A Embolism of vascular prosthetic devices, implants and grafts, initial encounter: Secondary | ICD-10-CM | POA: Diagnosis not present

## 2020-12-14 DIAGNOSIS — N186 End stage renal disease: Secondary | ICD-10-CM | POA: Diagnosis not present

## 2020-12-16 DIAGNOSIS — T82818A Embolism of vascular prosthetic devices, implants and grafts, initial encounter: Secondary | ICD-10-CM | POA: Diagnosis not present

## 2020-12-16 DIAGNOSIS — D509 Iron deficiency anemia, unspecified: Secondary | ICD-10-CM | POA: Diagnosis not present

## 2020-12-16 DIAGNOSIS — Z992 Dependence on renal dialysis: Secondary | ICD-10-CM | POA: Diagnosis not present

## 2020-12-16 DIAGNOSIS — E1022 Type 1 diabetes mellitus with diabetic chronic kidney disease: Secondary | ICD-10-CM | POA: Diagnosis not present

## 2020-12-16 DIAGNOSIS — D689 Coagulation defect, unspecified: Secondary | ICD-10-CM | POA: Diagnosis not present

## 2020-12-16 DIAGNOSIS — N186 End stage renal disease: Secondary | ICD-10-CM | POA: Diagnosis not present

## 2020-12-16 DIAGNOSIS — D631 Anemia in chronic kidney disease: Secondary | ICD-10-CM | POA: Diagnosis not present

## 2020-12-16 DIAGNOSIS — N2581 Secondary hyperparathyroidism of renal origin: Secondary | ICD-10-CM | POA: Diagnosis not present

## 2020-12-18 DIAGNOSIS — E1022 Type 1 diabetes mellitus with diabetic chronic kidney disease: Secondary | ICD-10-CM | POA: Diagnosis not present

## 2020-12-18 DIAGNOSIS — N2581 Secondary hyperparathyroidism of renal origin: Secondary | ICD-10-CM | POA: Diagnosis not present

## 2020-12-18 DIAGNOSIS — D509 Iron deficiency anemia, unspecified: Secondary | ICD-10-CM | POA: Diagnosis not present

## 2020-12-18 DIAGNOSIS — T82818A Embolism of vascular prosthetic devices, implants and grafts, initial encounter: Secondary | ICD-10-CM | POA: Diagnosis not present

## 2020-12-18 DIAGNOSIS — D689 Coagulation defect, unspecified: Secondary | ICD-10-CM | POA: Diagnosis not present

## 2020-12-18 DIAGNOSIS — Z992 Dependence on renal dialysis: Secondary | ICD-10-CM | POA: Diagnosis not present

## 2020-12-18 DIAGNOSIS — N186 End stage renal disease: Secondary | ICD-10-CM | POA: Diagnosis not present

## 2020-12-18 DIAGNOSIS — D631 Anemia in chronic kidney disease: Secondary | ICD-10-CM | POA: Diagnosis not present

## 2020-12-21 DIAGNOSIS — D631 Anemia in chronic kidney disease: Secondary | ICD-10-CM | POA: Diagnosis not present

## 2020-12-21 DIAGNOSIS — D509 Iron deficiency anemia, unspecified: Secondary | ICD-10-CM | POA: Diagnosis not present

## 2020-12-21 DIAGNOSIS — Z992 Dependence on renal dialysis: Secondary | ICD-10-CM | POA: Diagnosis not present

## 2020-12-21 DIAGNOSIS — T82818A Embolism of vascular prosthetic devices, implants and grafts, initial encounter: Secondary | ICD-10-CM | POA: Diagnosis not present

## 2020-12-21 DIAGNOSIS — N2581 Secondary hyperparathyroidism of renal origin: Secondary | ICD-10-CM | POA: Diagnosis not present

## 2020-12-21 DIAGNOSIS — N186 End stage renal disease: Secondary | ICD-10-CM | POA: Diagnosis not present

## 2020-12-21 DIAGNOSIS — E1022 Type 1 diabetes mellitus with diabetic chronic kidney disease: Secondary | ICD-10-CM | POA: Diagnosis not present

## 2020-12-21 DIAGNOSIS — D689 Coagulation defect, unspecified: Secondary | ICD-10-CM | POA: Diagnosis not present

## 2020-12-23 DIAGNOSIS — T82818A Embolism of vascular prosthetic devices, implants and grafts, initial encounter: Secondary | ICD-10-CM | POA: Diagnosis not present

## 2020-12-23 DIAGNOSIS — T8612 Kidney transplant failure: Secondary | ICD-10-CM | POA: Diagnosis not present

## 2020-12-23 DIAGNOSIS — D631 Anemia in chronic kidney disease: Secondary | ICD-10-CM | POA: Diagnosis not present

## 2020-12-23 DIAGNOSIS — D689 Coagulation defect, unspecified: Secondary | ICD-10-CM | POA: Diagnosis not present

## 2020-12-23 DIAGNOSIS — D509 Iron deficiency anemia, unspecified: Secondary | ICD-10-CM | POA: Diagnosis not present

## 2020-12-23 DIAGNOSIS — Z992 Dependence on renal dialysis: Secondary | ICD-10-CM | POA: Diagnosis not present

## 2020-12-23 DIAGNOSIS — N186 End stage renal disease: Secondary | ICD-10-CM | POA: Diagnosis not present

## 2020-12-23 DIAGNOSIS — E1022 Type 1 diabetes mellitus with diabetic chronic kidney disease: Secondary | ICD-10-CM | POA: Diagnosis not present

## 2020-12-23 DIAGNOSIS — N2581 Secondary hyperparathyroidism of renal origin: Secondary | ICD-10-CM | POA: Diagnosis not present

## 2020-12-25 DIAGNOSIS — Z992 Dependence on renal dialysis: Secondary | ICD-10-CM | POA: Diagnosis not present

## 2020-12-25 DIAGNOSIS — T82818A Embolism of vascular prosthetic devices, implants and grafts, initial encounter: Secondary | ICD-10-CM | POA: Diagnosis not present

## 2020-12-25 DIAGNOSIS — D689 Coagulation defect, unspecified: Secondary | ICD-10-CM | POA: Diagnosis not present

## 2020-12-25 DIAGNOSIS — N2581 Secondary hyperparathyroidism of renal origin: Secondary | ICD-10-CM | POA: Diagnosis not present

## 2020-12-25 DIAGNOSIS — N186 End stage renal disease: Secondary | ICD-10-CM | POA: Diagnosis not present

## 2020-12-28 DIAGNOSIS — N2581 Secondary hyperparathyroidism of renal origin: Secondary | ICD-10-CM | POA: Diagnosis not present

## 2020-12-28 DIAGNOSIS — D689 Coagulation defect, unspecified: Secondary | ICD-10-CM | POA: Diagnosis not present

## 2020-12-28 DIAGNOSIS — T82818A Embolism of vascular prosthetic devices, implants and grafts, initial encounter: Secondary | ICD-10-CM | POA: Diagnosis not present

## 2020-12-28 DIAGNOSIS — N186 End stage renal disease: Secondary | ICD-10-CM | POA: Diagnosis not present

## 2020-12-28 DIAGNOSIS — Z992 Dependence on renal dialysis: Secondary | ICD-10-CM | POA: Diagnosis not present

## 2020-12-29 DIAGNOSIS — E108 Type 1 diabetes mellitus with unspecified complications: Secondary | ICD-10-CM | POA: Diagnosis not present

## 2020-12-30 DIAGNOSIS — N186 End stage renal disease: Secondary | ICD-10-CM | POA: Diagnosis not present

## 2020-12-30 DIAGNOSIS — D689 Coagulation defect, unspecified: Secondary | ICD-10-CM | POA: Diagnosis not present

## 2020-12-30 DIAGNOSIS — T82818A Embolism of vascular prosthetic devices, implants and grafts, initial encounter: Secondary | ICD-10-CM | POA: Diagnosis not present

## 2020-12-30 DIAGNOSIS — Z992 Dependence on renal dialysis: Secondary | ICD-10-CM | POA: Diagnosis not present

## 2020-12-30 DIAGNOSIS — N2581 Secondary hyperparathyroidism of renal origin: Secondary | ICD-10-CM | POA: Diagnosis not present

## 2021-01-01 DIAGNOSIS — E108 Type 1 diabetes mellitus with unspecified complications: Secondary | ICD-10-CM | POA: Diagnosis not present

## 2021-01-01 DIAGNOSIS — T82818A Embolism of vascular prosthetic devices, implants and grafts, initial encounter: Secondary | ICD-10-CM | POA: Diagnosis not present

## 2021-01-01 DIAGNOSIS — Z992 Dependence on renal dialysis: Secondary | ICD-10-CM | POA: Diagnosis not present

## 2021-01-01 DIAGNOSIS — N2581 Secondary hyperparathyroidism of renal origin: Secondary | ICD-10-CM | POA: Diagnosis not present

## 2021-01-01 DIAGNOSIS — D689 Coagulation defect, unspecified: Secondary | ICD-10-CM | POA: Diagnosis not present

## 2021-01-01 DIAGNOSIS — N186 End stage renal disease: Secondary | ICD-10-CM | POA: Diagnosis not present

## 2021-01-11 DIAGNOSIS — D689 Coagulation defect, unspecified: Secondary | ICD-10-CM | POA: Diagnosis not present

## 2021-01-11 DIAGNOSIS — D631 Anemia in chronic kidney disease: Secondary | ICD-10-CM | POA: Diagnosis not present

## 2021-01-11 DIAGNOSIS — Z992 Dependence on renal dialysis: Secondary | ICD-10-CM | POA: Diagnosis not present

## 2021-01-11 DIAGNOSIS — D509 Iron deficiency anemia, unspecified: Secondary | ICD-10-CM | POA: Diagnosis not present

## 2021-01-11 DIAGNOSIS — E1022 Type 1 diabetes mellitus with diabetic chronic kidney disease: Secondary | ICD-10-CM | POA: Diagnosis not present

## 2021-01-11 DIAGNOSIS — N2581 Secondary hyperparathyroidism of renal origin: Secondary | ICD-10-CM | POA: Diagnosis not present

## 2021-01-11 DIAGNOSIS — T82818A Embolism of vascular prosthetic devices, implants and grafts, initial encounter: Secondary | ICD-10-CM | POA: Diagnosis not present

## 2021-01-11 DIAGNOSIS — N186 End stage renal disease: Secondary | ICD-10-CM | POA: Diagnosis not present

## 2021-01-13 DIAGNOSIS — N186 End stage renal disease: Secondary | ICD-10-CM | POA: Diagnosis not present

## 2021-01-13 DIAGNOSIS — T82818A Embolism of vascular prosthetic devices, implants and grafts, initial encounter: Secondary | ICD-10-CM | POA: Diagnosis not present

## 2021-01-13 DIAGNOSIS — D631 Anemia in chronic kidney disease: Secondary | ICD-10-CM | POA: Diagnosis not present

## 2021-01-13 DIAGNOSIS — E1022 Type 1 diabetes mellitus with diabetic chronic kidney disease: Secondary | ICD-10-CM | POA: Diagnosis not present

## 2021-01-13 DIAGNOSIS — N2581 Secondary hyperparathyroidism of renal origin: Secondary | ICD-10-CM | POA: Diagnosis not present

## 2021-01-13 DIAGNOSIS — Z992 Dependence on renal dialysis: Secondary | ICD-10-CM | POA: Diagnosis not present

## 2021-01-13 DIAGNOSIS — D509 Iron deficiency anemia, unspecified: Secondary | ICD-10-CM | POA: Diagnosis not present

## 2021-01-13 DIAGNOSIS — D689 Coagulation defect, unspecified: Secondary | ICD-10-CM | POA: Diagnosis not present

## 2021-01-15 DIAGNOSIS — Z992 Dependence on renal dialysis: Secondary | ICD-10-CM | POA: Diagnosis not present

## 2021-01-15 DIAGNOSIS — D631 Anemia in chronic kidney disease: Secondary | ICD-10-CM | POA: Diagnosis not present

## 2021-01-15 DIAGNOSIS — T82818A Embolism of vascular prosthetic devices, implants and grafts, initial encounter: Secondary | ICD-10-CM | POA: Diagnosis not present

## 2021-01-15 DIAGNOSIS — D689 Coagulation defect, unspecified: Secondary | ICD-10-CM | POA: Diagnosis not present

## 2021-01-15 DIAGNOSIS — N2581 Secondary hyperparathyroidism of renal origin: Secondary | ICD-10-CM | POA: Diagnosis not present

## 2021-01-15 DIAGNOSIS — N186 End stage renal disease: Secondary | ICD-10-CM | POA: Diagnosis not present

## 2021-01-15 DIAGNOSIS — E1022 Type 1 diabetes mellitus with diabetic chronic kidney disease: Secondary | ICD-10-CM | POA: Diagnosis not present

## 2021-01-15 DIAGNOSIS — D509 Iron deficiency anemia, unspecified: Secondary | ICD-10-CM | POA: Diagnosis not present

## 2021-01-22 DIAGNOSIS — T8612 Kidney transplant failure: Secondary | ICD-10-CM | POA: Diagnosis not present

## 2021-01-22 DIAGNOSIS — N186 End stage renal disease: Secondary | ICD-10-CM | POA: Diagnosis not present

## 2021-01-22 DIAGNOSIS — Z992 Dependence on renal dialysis: Secondary | ICD-10-CM | POA: Diagnosis not present

## 2021-01-25 DIAGNOSIS — Z992 Dependence on renal dialysis: Secondary | ICD-10-CM | POA: Diagnosis not present

## 2021-01-25 DIAGNOSIS — N186 End stage renal disease: Secondary | ICD-10-CM | POA: Diagnosis not present

## 2021-01-25 DIAGNOSIS — N2581 Secondary hyperparathyroidism of renal origin: Secondary | ICD-10-CM | POA: Diagnosis not present

## 2021-01-25 DIAGNOSIS — D689 Coagulation defect, unspecified: Secondary | ICD-10-CM | POA: Diagnosis not present

## 2021-01-25 DIAGNOSIS — T82818A Embolism of vascular prosthetic devices, implants and grafts, initial encounter: Secondary | ICD-10-CM | POA: Diagnosis not present

## 2021-01-25 DIAGNOSIS — D631 Anemia in chronic kidney disease: Secondary | ICD-10-CM | POA: Diagnosis not present

## 2021-01-26 DIAGNOSIS — H04123 Dry eye syndrome of bilateral lacrimal glands: Secondary | ICD-10-CM | POA: Diagnosis not present

## 2021-01-26 DIAGNOSIS — E103551 Type 1 diabetes mellitus with stable proliferative diabetic retinopathy, right eye: Secondary | ICD-10-CM | POA: Diagnosis not present

## 2021-01-26 DIAGNOSIS — H4921 Sixth [abducent] nerve palsy, right eye: Secondary | ICD-10-CM | POA: Diagnosis not present

## 2021-01-26 DIAGNOSIS — E103532 Type 1 diabetes mellitus with proliferative diabetic retinopathy with traction retinal detachment not involving the macula, left eye: Secondary | ICD-10-CM | POA: Diagnosis not present

## 2021-01-27 DIAGNOSIS — Z992 Dependence on renal dialysis: Secondary | ICD-10-CM | POA: Diagnosis not present

## 2021-01-27 DIAGNOSIS — D689 Coagulation defect, unspecified: Secondary | ICD-10-CM | POA: Diagnosis not present

## 2021-01-27 DIAGNOSIS — N186 End stage renal disease: Secondary | ICD-10-CM | POA: Diagnosis not present

## 2021-01-27 DIAGNOSIS — D631 Anemia in chronic kidney disease: Secondary | ICD-10-CM | POA: Diagnosis not present

## 2021-01-27 DIAGNOSIS — T82818A Embolism of vascular prosthetic devices, implants and grafts, initial encounter: Secondary | ICD-10-CM | POA: Diagnosis not present

## 2021-01-27 DIAGNOSIS — N2581 Secondary hyperparathyroidism of renal origin: Secondary | ICD-10-CM | POA: Diagnosis not present

## 2021-01-29 DIAGNOSIS — N2581 Secondary hyperparathyroidism of renal origin: Secondary | ICD-10-CM | POA: Diagnosis not present

## 2021-01-29 DIAGNOSIS — Z992 Dependence on renal dialysis: Secondary | ICD-10-CM | POA: Diagnosis not present

## 2021-01-29 DIAGNOSIS — T82818A Embolism of vascular prosthetic devices, implants and grafts, initial encounter: Secondary | ICD-10-CM | POA: Diagnosis not present

## 2021-01-29 DIAGNOSIS — N186 End stage renal disease: Secondary | ICD-10-CM | POA: Diagnosis not present

## 2021-01-29 DIAGNOSIS — D631 Anemia in chronic kidney disease: Secondary | ICD-10-CM | POA: Diagnosis not present

## 2021-01-29 DIAGNOSIS — D689 Coagulation defect, unspecified: Secondary | ICD-10-CM | POA: Diagnosis not present

## 2021-01-31 DIAGNOSIS — E108 Type 1 diabetes mellitus with unspecified complications: Secondary | ICD-10-CM | POA: Diagnosis not present

## 2021-02-01 DIAGNOSIS — N2581 Secondary hyperparathyroidism of renal origin: Secondary | ICD-10-CM | POA: Diagnosis not present

## 2021-02-01 DIAGNOSIS — D689 Coagulation defect, unspecified: Secondary | ICD-10-CM | POA: Diagnosis not present

## 2021-02-01 DIAGNOSIS — T82818A Embolism of vascular prosthetic devices, implants and grafts, initial encounter: Secondary | ICD-10-CM | POA: Diagnosis not present

## 2021-02-01 DIAGNOSIS — Z992 Dependence on renal dialysis: Secondary | ICD-10-CM | POA: Diagnosis not present

## 2021-02-01 DIAGNOSIS — N186 End stage renal disease: Secondary | ICD-10-CM | POA: Diagnosis not present

## 2021-02-01 DIAGNOSIS — D631 Anemia in chronic kidney disease: Secondary | ICD-10-CM | POA: Diagnosis not present

## 2021-02-03 DIAGNOSIS — N186 End stage renal disease: Secondary | ICD-10-CM | POA: Diagnosis not present

## 2021-02-03 DIAGNOSIS — I120 Hypertensive chronic kidney disease with stage 5 chronic kidney disease or end stage renal disease: Secondary | ICD-10-CM | POA: Diagnosis not present

## 2021-02-03 DIAGNOSIS — M79604 Pain in right leg: Secondary | ICD-10-CM | POA: Diagnosis not present

## 2021-02-03 DIAGNOSIS — Z992 Dependence on renal dialysis: Secondary | ICD-10-CM | POA: Diagnosis not present

## 2021-02-03 DIAGNOSIS — D689 Coagulation defect, unspecified: Secondary | ICD-10-CM | POA: Diagnosis not present

## 2021-02-03 DIAGNOSIS — Z9641 Presence of insulin pump (external) (internal): Secondary | ICD-10-CM | POA: Diagnosis not present

## 2021-02-03 DIAGNOSIS — Z794 Long term (current) use of insulin: Secondary | ICD-10-CM | POA: Diagnosis not present

## 2021-02-03 DIAGNOSIS — D631 Anemia in chronic kidney disease: Secondary | ICD-10-CM | POA: Diagnosis not present

## 2021-02-03 DIAGNOSIS — E1022 Type 1 diabetes mellitus with diabetic chronic kidney disease: Secondary | ICD-10-CM | POA: Diagnosis not present

## 2021-02-03 DIAGNOSIS — Z89512 Acquired absence of left leg below knee: Secondary | ICD-10-CM | POA: Diagnosis not present

## 2021-02-03 DIAGNOSIS — N2581 Secondary hyperparathyroidism of renal origin: Secondary | ICD-10-CM | POA: Diagnosis not present

## 2021-02-03 DIAGNOSIS — T82818A Embolism of vascular prosthetic devices, implants and grafts, initial encounter: Secondary | ICD-10-CM | POA: Diagnosis not present

## 2021-02-03 DIAGNOSIS — M25561 Pain in right knee: Secondary | ICD-10-CM | POA: Diagnosis not present

## 2021-02-04 DIAGNOSIS — M79662 Pain in left lower leg: Secondary | ICD-10-CM | POA: Diagnosis not present

## 2021-02-04 DIAGNOSIS — M2550 Pain in unspecified joint: Secondary | ICD-10-CM | POA: Diagnosis not present

## 2021-02-04 DIAGNOSIS — M79671 Pain in right foot: Secondary | ICD-10-CM | POA: Diagnosis not present

## 2021-02-04 DIAGNOSIS — G629 Polyneuropathy, unspecified: Secondary | ICD-10-CM | POA: Diagnosis not present

## 2021-02-04 DIAGNOSIS — Z79899 Other long term (current) drug therapy: Secondary | ICD-10-CM | POA: Diagnosis not present

## 2021-02-04 DIAGNOSIS — M79672 Pain in left foot: Secondary | ICD-10-CM | POA: Diagnosis not present

## 2021-02-04 DIAGNOSIS — M79604 Pain in right leg: Secondary | ICD-10-CM | POA: Diagnosis not present

## 2021-02-04 DIAGNOSIS — M79661 Pain in right lower leg: Secondary | ICD-10-CM | POA: Diagnosis not present

## 2021-02-05 DIAGNOSIS — N2581 Secondary hyperparathyroidism of renal origin: Secondary | ICD-10-CM | POA: Diagnosis not present

## 2021-02-05 DIAGNOSIS — N186 End stage renal disease: Secondary | ICD-10-CM | POA: Diagnosis not present

## 2021-02-05 DIAGNOSIS — T82818A Embolism of vascular prosthetic devices, implants and grafts, initial encounter: Secondary | ICD-10-CM | POA: Diagnosis not present

## 2021-02-05 DIAGNOSIS — D631 Anemia in chronic kidney disease: Secondary | ICD-10-CM | POA: Diagnosis not present

## 2021-02-05 DIAGNOSIS — D689 Coagulation defect, unspecified: Secondary | ICD-10-CM | POA: Diagnosis not present

## 2021-02-05 DIAGNOSIS — Z992 Dependence on renal dialysis: Secondary | ICD-10-CM | POA: Diagnosis not present

## 2021-02-08 DIAGNOSIS — D689 Coagulation defect, unspecified: Secondary | ICD-10-CM | POA: Diagnosis not present

## 2021-02-08 DIAGNOSIS — N2581 Secondary hyperparathyroidism of renal origin: Secondary | ICD-10-CM | POA: Diagnosis not present

## 2021-02-08 DIAGNOSIS — D631 Anemia in chronic kidney disease: Secondary | ICD-10-CM | POA: Diagnosis not present

## 2021-02-08 DIAGNOSIS — T82818A Embolism of vascular prosthetic devices, implants and grafts, initial encounter: Secondary | ICD-10-CM | POA: Diagnosis not present

## 2021-02-08 DIAGNOSIS — N186 End stage renal disease: Secondary | ICD-10-CM | POA: Diagnosis not present

## 2021-02-08 DIAGNOSIS — R509 Fever, unspecified: Secondary | ICD-10-CM | POA: Diagnosis not present

## 2021-02-08 DIAGNOSIS — Z992 Dependence on renal dialysis: Secondary | ICD-10-CM | POA: Diagnosis not present

## 2021-02-08 DIAGNOSIS — E1022 Type 1 diabetes mellitus with diabetic chronic kidney disease: Secondary | ICD-10-CM | POA: Diagnosis not present

## 2021-02-08 DIAGNOSIS — R197 Diarrhea, unspecified: Secondary | ICD-10-CM | POA: Diagnosis not present

## 2021-02-08 DIAGNOSIS — D509 Iron deficiency anemia, unspecified: Secondary | ICD-10-CM | POA: Diagnosis not present

## 2021-02-10 DIAGNOSIS — R197 Diarrhea, unspecified: Secondary | ICD-10-CM | POA: Diagnosis not present

## 2021-02-10 DIAGNOSIS — Z992 Dependence on renal dialysis: Secondary | ICD-10-CM | POA: Diagnosis not present

## 2021-02-10 DIAGNOSIS — N2581 Secondary hyperparathyroidism of renal origin: Secondary | ICD-10-CM | POA: Diagnosis not present

## 2021-02-10 DIAGNOSIS — N186 End stage renal disease: Secondary | ICD-10-CM | POA: Diagnosis not present

## 2021-02-10 DIAGNOSIS — D509 Iron deficiency anemia, unspecified: Secondary | ICD-10-CM | POA: Diagnosis not present

## 2021-02-10 DIAGNOSIS — E1022 Type 1 diabetes mellitus with diabetic chronic kidney disease: Secondary | ICD-10-CM | POA: Diagnosis not present

## 2021-02-10 DIAGNOSIS — D689 Coagulation defect, unspecified: Secondary | ICD-10-CM | POA: Diagnosis not present

## 2021-02-10 DIAGNOSIS — T82818A Embolism of vascular prosthetic devices, implants and grafts, initial encounter: Secondary | ICD-10-CM | POA: Diagnosis not present

## 2021-02-10 DIAGNOSIS — D631 Anemia in chronic kidney disease: Secondary | ICD-10-CM | POA: Diagnosis not present

## 2021-02-10 DIAGNOSIS — R509 Fever, unspecified: Secondary | ICD-10-CM | POA: Diagnosis not present

## 2021-02-12 DIAGNOSIS — N186 End stage renal disease: Secondary | ICD-10-CM | POA: Diagnosis not present

## 2021-02-12 DIAGNOSIS — D631 Anemia in chronic kidney disease: Secondary | ICD-10-CM | POA: Diagnosis not present

## 2021-02-12 DIAGNOSIS — Z992 Dependence on renal dialysis: Secondary | ICD-10-CM | POA: Diagnosis not present

## 2021-02-12 DIAGNOSIS — D509 Iron deficiency anemia, unspecified: Secondary | ICD-10-CM | POA: Diagnosis not present

## 2021-02-12 DIAGNOSIS — R197 Diarrhea, unspecified: Secondary | ICD-10-CM | POA: Diagnosis not present

## 2021-02-12 DIAGNOSIS — D689 Coagulation defect, unspecified: Secondary | ICD-10-CM | POA: Diagnosis not present

## 2021-02-12 DIAGNOSIS — T82818A Embolism of vascular prosthetic devices, implants and grafts, initial encounter: Secondary | ICD-10-CM | POA: Diagnosis not present

## 2021-02-12 DIAGNOSIS — E1022 Type 1 diabetes mellitus with diabetic chronic kidney disease: Secondary | ICD-10-CM | POA: Diagnosis not present

## 2021-02-12 DIAGNOSIS — R509 Fever, unspecified: Secondary | ICD-10-CM | POA: Diagnosis not present

## 2021-02-12 DIAGNOSIS — N2581 Secondary hyperparathyroidism of renal origin: Secondary | ICD-10-CM | POA: Diagnosis not present

## 2021-02-15 DIAGNOSIS — D631 Anemia in chronic kidney disease: Secondary | ICD-10-CM | POA: Diagnosis not present

## 2021-02-15 DIAGNOSIS — T82818A Embolism of vascular prosthetic devices, implants and grafts, initial encounter: Secondary | ICD-10-CM | POA: Diagnosis not present

## 2021-02-15 DIAGNOSIS — Z992 Dependence on renal dialysis: Secondary | ICD-10-CM | POA: Diagnosis not present

## 2021-02-15 DIAGNOSIS — N186 End stage renal disease: Secondary | ICD-10-CM | POA: Diagnosis not present

## 2021-02-15 DIAGNOSIS — D689 Coagulation defect, unspecified: Secondary | ICD-10-CM | POA: Diagnosis not present

## 2021-02-15 DIAGNOSIS — N2581 Secondary hyperparathyroidism of renal origin: Secondary | ICD-10-CM | POA: Diagnosis not present

## 2021-02-16 DIAGNOSIS — I739 Peripheral vascular disease, unspecified: Secondary | ICD-10-CM | POA: Diagnosis not present

## 2021-02-16 DIAGNOSIS — L98499 Non-pressure chronic ulcer of skin of other sites with unspecified severity: Secondary | ICD-10-CM | POA: Diagnosis not present

## 2021-02-16 DIAGNOSIS — S90424D Blister (nonthermal), right lesser toe(s), subsequent encounter: Secondary | ICD-10-CM | POA: Diagnosis not present

## 2021-02-16 DIAGNOSIS — I872 Venous insufficiency (chronic) (peripheral): Secondary | ICD-10-CM | POA: Diagnosis not present

## 2021-02-17 DIAGNOSIS — N186 End stage renal disease: Secondary | ICD-10-CM | POA: Diagnosis not present

## 2021-02-17 DIAGNOSIS — N2581 Secondary hyperparathyroidism of renal origin: Secondary | ICD-10-CM | POA: Diagnosis not present

## 2021-02-17 DIAGNOSIS — T82818A Embolism of vascular prosthetic devices, implants and grafts, initial encounter: Secondary | ICD-10-CM | POA: Diagnosis not present

## 2021-02-17 DIAGNOSIS — Z992 Dependence on renal dialysis: Secondary | ICD-10-CM | POA: Diagnosis not present

## 2021-02-17 DIAGNOSIS — D631 Anemia in chronic kidney disease: Secondary | ICD-10-CM | POA: Diagnosis not present

## 2021-02-17 DIAGNOSIS — D689 Coagulation defect, unspecified: Secondary | ICD-10-CM | POA: Diagnosis not present

## 2021-02-19 DIAGNOSIS — Z992 Dependence on renal dialysis: Secondary | ICD-10-CM | POA: Diagnosis not present

## 2021-02-19 DIAGNOSIS — N2581 Secondary hyperparathyroidism of renal origin: Secondary | ICD-10-CM | POA: Diagnosis not present

## 2021-02-19 DIAGNOSIS — D689 Coagulation defect, unspecified: Secondary | ICD-10-CM | POA: Diagnosis not present

## 2021-02-19 DIAGNOSIS — N186 End stage renal disease: Secondary | ICD-10-CM | POA: Diagnosis not present

## 2021-02-19 DIAGNOSIS — T82818A Embolism of vascular prosthetic devices, implants and grafts, initial encounter: Secondary | ICD-10-CM | POA: Diagnosis not present

## 2021-02-19 DIAGNOSIS — D631 Anemia in chronic kidney disease: Secondary | ICD-10-CM | POA: Diagnosis not present

## 2021-02-22 DIAGNOSIS — Z992 Dependence on renal dialysis: Secondary | ICD-10-CM | POA: Diagnosis not present

## 2021-02-22 DIAGNOSIS — T8612 Kidney transplant failure: Secondary | ICD-10-CM | POA: Diagnosis not present

## 2021-02-22 DIAGNOSIS — N186 End stage renal disease: Secondary | ICD-10-CM | POA: Diagnosis not present

## 2021-02-22 DIAGNOSIS — D689 Coagulation defect, unspecified: Secondary | ICD-10-CM | POA: Diagnosis not present

## 2021-02-22 DIAGNOSIS — N2581 Secondary hyperparathyroidism of renal origin: Secondary | ICD-10-CM | POA: Diagnosis not present

## 2021-02-22 DIAGNOSIS — T82818A Embolism of vascular prosthetic devices, implants and grafts, initial encounter: Secondary | ICD-10-CM | POA: Diagnosis not present

## 2021-02-24 DIAGNOSIS — N186 End stage renal disease: Secondary | ICD-10-CM | POA: Diagnosis not present

## 2021-02-24 DIAGNOSIS — T82818A Embolism of vascular prosthetic devices, implants and grafts, initial encounter: Secondary | ICD-10-CM | POA: Diagnosis not present

## 2021-02-24 DIAGNOSIS — N2581 Secondary hyperparathyroidism of renal origin: Secondary | ICD-10-CM | POA: Diagnosis not present

## 2021-02-24 DIAGNOSIS — D689 Coagulation defect, unspecified: Secondary | ICD-10-CM | POA: Diagnosis not present

## 2021-02-24 DIAGNOSIS — Z992 Dependence on renal dialysis: Secondary | ICD-10-CM | POA: Diagnosis not present

## 2021-02-24 DIAGNOSIS — D631 Anemia in chronic kidney disease: Secondary | ICD-10-CM | POA: Diagnosis not present

## 2021-02-26 DIAGNOSIS — D689 Coagulation defect, unspecified: Secondary | ICD-10-CM | POA: Diagnosis not present

## 2021-02-26 DIAGNOSIS — N2581 Secondary hyperparathyroidism of renal origin: Secondary | ICD-10-CM | POA: Diagnosis not present

## 2021-02-26 DIAGNOSIS — N186 End stage renal disease: Secondary | ICD-10-CM | POA: Diagnosis not present

## 2021-02-26 DIAGNOSIS — T82818A Embolism of vascular prosthetic devices, implants and grafts, initial encounter: Secondary | ICD-10-CM | POA: Diagnosis not present

## 2021-02-26 DIAGNOSIS — Z992 Dependence on renal dialysis: Secondary | ICD-10-CM | POA: Diagnosis not present

## 2021-02-26 DIAGNOSIS — D631 Anemia in chronic kidney disease: Secondary | ICD-10-CM | POA: Diagnosis not present

## 2021-03-01 DIAGNOSIS — N2581 Secondary hyperparathyroidism of renal origin: Secondary | ICD-10-CM | POA: Diagnosis not present

## 2021-03-01 DIAGNOSIS — D689 Coagulation defect, unspecified: Secondary | ICD-10-CM | POA: Diagnosis not present

## 2021-03-01 DIAGNOSIS — T82818A Embolism of vascular prosthetic devices, implants and grafts, initial encounter: Secondary | ICD-10-CM | POA: Diagnosis not present

## 2021-03-01 DIAGNOSIS — N186 End stage renal disease: Secondary | ICD-10-CM | POA: Diagnosis not present

## 2021-03-01 DIAGNOSIS — Z992 Dependence on renal dialysis: Secondary | ICD-10-CM | POA: Diagnosis not present

## 2021-03-01 DIAGNOSIS — D631 Anemia in chronic kidney disease: Secondary | ICD-10-CM | POA: Diagnosis not present

## 2021-03-01 DIAGNOSIS — R519 Headache, unspecified: Secondary | ICD-10-CM | POA: Diagnosis not present

## 2021-03-02 DIAGNOSIS — E10649 Type 1 diabetes mellitus with hypoglycemia without coma: Secondary | ICD-10-CM | POA: Diagnosis not present

## 2021-03-02 DIAGNOSIS — E1059 Type 1 diabetes mellitus with other circulatory complications: Secondary | ICD-10-CM | POA: Diagnosis not present

## 2021-03-02 DIAGNOSIS — E103593 Type 1 diabetes mellitus with proliferative diabetic retinopathy without macular edema, bilateral: Secondary | ICD-10-CM | POA: Diagnosis not present

## 2021-03-02 DIAGNOSIS — Z992 Dependence on renal dialysis: Secondary | ICD-10-CM | POA: Diagnosis not present

## 2021-03-02 DIAGNOSIS — E108 Type 1 diabetes mellitus with unspecified complications: Secondary | ICD-10-CM | POA: Diagnosis not present

## 2021-03-02 DIAGNOSIS — H16223 Keratoconjunctivitis sicca, not specified as Sjogren's, bilateral: Secondary | ICD-10-CM | POA: Diagnosis not present

## 2021-03-02 DIAGNOSIS — E781 Pure hyperglyceridemia: Secondary | ICD-10-CM | POA: Diagnosis not present

## 2021-03-02 DIAGNOSIS — H04123 Dry eye syndrome of bilateral lacrimal glands: Secondary | ICD-10-CM | POA: Diagnosis not present

## 2021-03-02 DIAGNOSIS — E1051 Type 1 diabetes mellitus with diabetic peripheral angiopathy without gangrene: Secondary | ICD-10-CM | POA: Diagnosis not present

## 2021-03-02 DIAGNOSIS — E1022 Type 1 diabetes mellitus with diabetic chronic kidney disease: Secondary | ICD-10-CM | POA: Diagnosis not present

## 2021-03-02 DIAGNOSIS — E1069 Type 1 diabetes mellitus with other specified complication: Secondary | ICD-10-CM | POA: Diagnosis not present

## 2021-03-02 DIAGNOSIS — N186 End stage renal disease: Secondary | ICD-10-CM | POA: Diagnosis not present

## 2021-03-02 DIAGNOSIS — E1065 Type 1 diabetes mellitus with hyperglycemia: Secondary | ICD-10-CM | POA: Diagnosis not present

## 2021-03-02 DIAGNOSIS — E1040 Type 1 diabetes mellitus with diabetic neuropathy, unspecified: Secondary | ICD-10-CM | POA: Diagnosis not present

## 2021-03-02 DIAGNOSIS — Z89612 Acquired absence of left leg above knee: Secondary | ICD-10-CM | POA: Diagnosis not present

## 2021-03-03 DIAGNOSIS — N2581 Secondary hyperparathyroidism of renal origin: Secondary | ICD-10-CM | POA: Diagnosis not present

## 2021-03-03 DIAGNOSIS — D689 Coagulation defect, unspecified: Secondary | ICD-10-CM | POA: Diagnosis not present

## 2021-03-03 DIAGNOSIS — R519 Headache, unspecified: Secondary | ICD-10-CM | POA: Diagnosis not present

## 2021-03-03 DIAGNOSIS — D631 Anemia in chronic kidney disease: Secondary | ICD-10-CM | POA: Diagnosis not present

## 2021-03-03 DIAGNOSIS — N186 End stage renal disease: Secondary | ICD-10-CM | POA: Diagnosis not present

## 2021-03-03 DIAGNOSIS — T82818A Embolism of vascular prosthetic devices, implants and grafts, initial encounter: Secondary | ICD-10-CM | POA: Diagnosis not present

## 2021-03-03 DIAGNOSIS — Z992 Dependence on renal dialysis: Secondary | ICD-10-CM | POA: Diagnosis not present

## 2021-03-05 DIAGNOSIS — T82818A Embolism of vascular prosthetic devices, implants and grafts, initial encounter: Secondary | ICD-10-CM | POA: Diagnosis not present

## 2021-03-05 DIAGNOSIS — R519 Headache, unspecified: Secondary | ICD-10-CM | POA: Diagnosis not present

## 2021-03-05 DIAGNOSIS — N2581 Secondary hyperparathyroidism of renal origin: Secondary | ICD-10-CM | POA: Diagnosis not present

## 2021-03-05 DIAGNOSIS — Z992 Dependence on renal dialysis: Secondary | ICD-10-CM | POA: Diagnosis not present

## 2021-03-05 DIAGNOSIS — D631 Anemia in chronic kidney disease: Secondary | ICD-10-CM | POA: Diagnosis not present

## 2021-03-05 DIAGNOSIS — D689 Coagulation defect, unspecified: Secondary | ICD-10-CM | POA: Diagnosis not present

## 2021-03-05 DIAGNOSIS — N186 End stage renal disease: Secondary | ICD-10-CM | POA: Diagnosis not present

## 2021-03-08 DIAGNOSIS — N2581 Secondary hyperparathyroidism of renal origin: Secondary | ICD-10-CM | POA: Diagnosis not present

## 2021-03-08 DIAGNOSIS — E1022 Type 1 diabetes mellitus with diabetic chronic kidney disease: Secondary | ICD-10-CM | POA: Diagnosis not present

## 2021-03-08 DIAGNOSIS — D631 Anemia in chronic kidney disease: Secondary | ICD-10-CM | POA: Diagnosis not present

## 2021-03-08 DIAGNOSIS — T82818A Embolism of vascular prosthetic devices, implants and grafts, initial encounter: Secondary | ICD-10-CM | POA: Diagnosis not present

## 2021-03-08 DIAGNOSIS — N186 End stage renal disease: Secondary | ICD-10-CM | POA: Diagnosis not present

## 2021-03-08 DIAGNOSIS — D689 Coagulation defect, unspecified: Secondary | ICD-10-CM | POA: Diagnosis not present

## 2021-03-08 DIAGNOSIS — Z992 Dependence on renal dialysis: Secondary | ICD-10-CM | POA: Diagnosis not present

## 2021-03-08 DIAGNOSIS — D509 Iron deficiency anemia, unspecified: Secondary | ICD-10-CM | POA: Diagnosis not present

## 2021-03-09 DIAGNOSIS — H16143 Punctate keratitis, bilateral: Secondary | ICD-10-CM | POA: Diagnosis not present

## 2021-03-09 DIAGNOSIS — M1711 Unilateral primary osteoarthritis, right knee: Secondary | ICD-10-CM | POA: Diagnosis not present

## 2021-03-10 DIAGNOSIS — T82818A Embolism of vascular prosthetic devices, implants and grafts, initial encounter: Secondary | ICD-10-CM | POA: Diagnosis not present

## 2021-03-10 DIAGNOSIS — N2581 Secondary hyperparathyroidism of renal origin: Secondary | ICD-10-CM | POA: Diagnosis not present

## 2021-03-10 DIAGNOSIS — N186 End stage renal disease: Secondary | ICD-10-CM | POA: Diagnosis not present

## 2021-03-10 DIAGNOSIS — D509 Iron deficiency anemia, unspecified: Secondary | ICD-10-CM | POA: Diagnosis not present

## 2021-03-10 DIAGNOSIS — Z992 Dependence on renal dialysis: Secondary | ICD-10-CM | POA: Diagnosis not present

## 2021-03-10 DIAGNOSIS — D631 Anemia in chronic kidney disease: Secondary | ICD-10-CM | POA: Diagnosis not present

## 2021-03-10 DIAGNOSIS — D689 Coagulation defect, unspecified: Secondary | ICD-10-CM | POA: Diagnosis not present

## 2021-03-10 DIAGNOSIS — E1022 Type 1 diabetes mellitus with diabetic chronic kidney disease: Secondary | ICD-10-CM | POA: Diagnosis not present

## 2021-03-12 DIAGNOSIS — D689 Coagulation defect, unspecified: Secondary | ICD-10-CM | POA: Diagnosis not present

## 2021-03-12 DIAGNOSIS — D631 Anemia in chronic kidney disease: Secondary | ICD-10-CM | POA: Diagnosis not present

## 2021-03-12 DIAGNOSIS — T82818A Embolism of vascular prosthetic devices, implants and grafts, initial encounter: Secondary | ICD-10-CM | POA: Diagnosis not present

## 2021-03-12 DIAGNOSIS — N186 End stage renal disease: Secondary | ICD-10-CM | POA: Diagnosis not present

## 2021-03-12 DIAGNOSIS — Z992 Dependence on renal dialysis: Secondary | ICD-10-CM | POA: Diagnosis not present

## 2021-03-12 DIAGNOSIS — D509 Iron deficiency anemia, unspecified: Secondary | ICD-10-CM | POA: Diagnosis not present

## 2021-03-12 DIAGNOSIS — E1022 Type 1 diabetes mellitus with diabetic chronic kidney disease: Secondary | ICD-10-CM | POA: Diagnosis not present

## 2021-03-12 DIAGNOSIS — N2581 Secondary hyperparathyroidism of renal origin: Secondary | ICD-10-CM | POA: Diagnosis not present

## 2021-03-15 DIAGNOSIS — N186 End stage renal disease: Secondary | ICD-10-CM | POA: Diagnosis not present

## 2021-03-15 DIAGNOSIS — N2581 Secondary hyperparathyroidism of renal origin: Secondary | ICD-10-CM | POA: Diagnosis not present

## 2021-03-15 DIAGNOSIS — T82818A Embolism of vascular prosthetic devices, implants and grafts, initial encounter: Secondary | ICD-10-CM | POA: Diagnosis not present

## 2021-03-15 DIAGNOSIS — Z992 Dependence on renal dialysis: Secondary | ICD-10-CM | POA: Diagnosis not present

## 2021-03-15 DIAGNOSIS — D631 Anemia in chronic kidney disease: Secondary | ICD-10-CM | POA: Diagnosis not present

## 2021-03-15 DIAGNOSIS — D689 Coagulation defect, unspecified: Secondary | ICD-10-CM | POA: Diagnosis not present

## 2021-03-15 DIAGNOSIS — E875 Hyperkalemia: Secondary | ICD-10-CM | POA: Diagnosis not present

## 2021-03-17 DIAGNOSIS — N2581 Secondary hyperparathyroidism of renal origin: Secondary | ICD-10-CM | POA: Diagnosis not present

## 2021-03-17 DIAGNOSIS — N186 End stage renal disease: Secondary | ICD-10-CM | POA: Diagnosis not present

## 2021-03-17 DIAGNOSIS — E875 Hyperkalemia: Secondary | ICD-10-CM | POA: Diagnosis not present

## 2021-03-17 DIAGNOSIS — D689 Coagulation defect, unspecified: Secondary | ICD-10-CM | POA: Diagnosis not present

## 2021-03-17 DIAGNOSIS — Z992 Dependence on renal dialysis: Secondary | ICD-10-CM | POA: Diagnosis not present

## 2021-03-17 DIAGNOSIS — T82818A Embolism of vascular prosthetic devices, implants and grafts, initial encounter: Secondary | ICD-10-CM | POA: Diagnosis not present

## 2021-03-17 DIAGNOSIS — D631 Anemia in chronic kidney disease: Secondary | ICD-10-CM | POA: Diagnosis not present

## 2021-03-20 DIAGNOSIS — T82818A Embolism of vascular prosthetic devices, implants and grafts, initial encounter: Secondary | ICD-10-CM | POA: Diagnosis not present

## 2021-03-20 DIAGNOSIS — D689 Coagulation defect, unspecified: Secondary | ICD-10-CM | POA: Diagnosis not present

## 2021-03-20 DIAGNOSIS — N2581 Secondary hyperparathyroidism of renal origin: Secondary | ICD-10-CM | POA: Diagnosis not present

## 2021-03-20 DIAGNOSIS — E875 Hyperkalemia: Secondary | ICD-10-CM | POA: Diagnosis not present

## 2021-03-20 DIAGNOSIS — D631 Anemia in chronic kidney disease: Secondary | ICD-10-CM | POA: Diagnosis not present

## 2021-03-20 DIAGNOSIS — N186 End stage renal disease: Secondary | ICD-10-CM | POA: Diagnosis not present

## 2021-03-20 DIAGNOSIS — Z992 Dependence on renal dialysis: Secondary | ICD-10-CM | POA: Diagnosis not present

## 2021-03-22 DIAGNOSIS — N186 End stage renal disease: Secondary | ICD-10-CM | POA: Diagnosis not present

## 2021-03-22 DIAGNOSIS — T82818A Embolism of vascular prosthetic devices, implants and grafts, initial encounter: Secondary | ICD-10-CM | POA: Diagnosis not present

## 2021-03-22 DIAGNOSIS — D689 Coagulation defect, unspecified: Secondary | ICD-10-CM | POA: Diagnosis not present

## 2021-03-22 DIAGNOSIS — N2581 Secondary hyperparathyroidism of renal origin: Secondary | ICD-10-CM | POA: Diagnosis not present

## 2021-03-22 DIAGNOSIS — Z992 Dependence on renal dialysis: Secondary | ICD-10-CM | POA: Diagnosis not present

## 2021-03-22 DIAGNOSIS — D631 Anemia in chronic kidney disease: Secondary | ICD-10-CM | POA: Diagnosis not present

## 2021-03-24 DIAGNOSIS — N2581 Secondary hyperparathyroidism of renal origin: Secondary | ICD-10-CM | POA: Diagnosis not present

## 2021-03-24 DIAGNOSIS — D631 Anemia in chronic kidney disease: Secondary | ICD-10-CM | POA: Diagnosis not present

## 2021-03-24 DIAGNOSIS — D689 Coagulation defect, unspecified: Secondary | ICD-10-CM | POA: Diagnosis not present

## 2021-03-24 DIAGNOSIS — Z992 Dependence on renal dialysis: Secondary | ICD-10-CM | POA: Diagnosis not present

## 2021-03-24 DIAGNOSIS — N186 End stage renal disease: Secondary | ICD-10-CM | POA: Diagnosis not present

## 2021-03-24 DIAGNOSIS — T82818A Embolism of vascular prosthetic devices, implants and grafts, initial encounter: Secondary | ICD-10-CM | POA: Diagnosis not present

## 2021-03-24 DIAGNOSIS — T8612 Kidney transplant failure: Secondary | ICD-10-CM | POA: Diagnosis not present

## 2021-03-26 DIAGNOSIS — E1022 Type 1 diabetes mellitus with diabetic chronic kidney disease: Secondary | ICD-10-CM | POA: Diagnosis not present

## 2021-03-26 DIAGNOSIS — Z992 Dependence on renal dialysis: Secondary | ICD-10-CM | POA: Diagnosis not present

## 2021-03-26 DIAGNOSIS — N186 End stage renal disease: Secondary | ICD-10-CM | POA: Diagnosis not present

## 2021-03-26 DIAGNOSIS — R197 Diarrhea, unspecified: Secondary | ICD-10-CM | POA: Diagnosis not present

## 2021-03-26 DIAGNOSIS — D689 Coagulation defect, unspecified: Secondary | ICD-10-CM | POA: Diagnosis not present

## 2021-03-26 DIAGNOSIS — D631 Anemia in chronic kidney disease: Secondary | ICD-10-CM | POA: Diagnosis not present

## 2021-03-26 DIAGNOSIS — D509 Iron deficiency anemia, unspecified: Secondary | ICD-10-CM | POA: Diagnosis not present

## 2021-03-26 DIAGNOSIS — N2581 Secondary hyperparathyroidism of renal origin: Secondary | ICD-10-CM | POA: Diagnosis not present

## 2021-03-26 DIAGNOSIS — T82818A Embolism of vascular prosthetic devices, implants and grafts, initial encounter: Secondary | ICD-10-CM | POA: Diagnosis not present

## 2021-03-29 DIAGNOSIS — T82818A Embolism of vascular prosthetic devices, implants and grafts, initial encounter: Secondary | ICD-10-CM | POA: Diagnosis not present

## 2021-03-29 DIAGNOSIS — Z992 Dependence on renal dialysis: Secondary | ICD-10-CM | POA: Diagnosis not present

## 2021-03-29 DIAGNOSIS — N2581 Secondary hyperparathyroidism of renal origin: Secondary | ICD-10-CM | POA: Diagnosis not present

## 2021-03-29 DIAGNOSIS — R197 Diarrhea, unspecified: Secondary | ICD-10-CM | POA: Diagnosis not present

## 2021-03-29 DIAGNOSIS — E1022 Type 1 diabetes mellitus with diabetic chronic kidney disease: Secondary | ICD-10-CM | POA: Diagnosis not present

## 2021-03-29 DIAGNOSIS — N186 End stage renal disease: Secondary | ICD-10-CM | POA: Diagnosis not present

## 2021-03-29 DIAGNOSIS — D689 Coagulation defect, unspecified: Secondary | ICD-10-CM | POA: Diagnosis not present

## 2021-03-29 DIAGNOSIS — D509 Iron deficiency anemia, unspecified: Secondary | ICD-10-CM | POA: Diagnosis not present

## 2021-03-29 DIAGNOSIS — D631 Anemia in chronic kidney disease: Secondary | ICD-10-CM | POA: Diagnosis not present

## 2021-03-31 DIAGNOSIS — Z992 Dependence on renal dialysis: Secondary | ICD-10-CM | POA: Diagnosis not present

## 2021-03-31 DIAGNOSIS — D631 Anemia in chronic kidney disease: Secondary | ICD-10-CM | POA: Diagnosis not present

## 2021-03-31 DIAGNOSIS — T82818A Embolism of vascular prosthetic devices, implants and grafts, initial encounter: Secondary | ICD-10-CM | POA: Diagnosis not present

## 2021-03-31 DIAGNOSIS — E1022 Type 1 diabetes mellitus with diabetic chronic kidney disease: Secondary | ICD-10-CM | POA: Diagnosis not present

## 2021-03-31 DIAGNOSIS — D689 Coagulation defect, unspecified: Secondary | ICD-10-CM | POA: Diagnosis not present

## 2021-03-31 DIAGNOSIS — N186 End stage renal disease: Secondary | ICD-10-CM | POA: Diagnosis not present

## 2021-03-31 DIAGNOSIS — N2581 Secondary hyperparathyroidism of renal origin: Secondary | ICD-10-CM | POA: Diagnosis not present

## 2021-03-31 DIAGNOSIS — D509 Iron deficiency anemia, unspecified: Secondary | ICD-10-CM | POA: Diagnosis not present

## 2021-03-31 DIAGNOSIS — R197 Diarrhea, unspecified: Secondary | ICD-10-CM | POA: Diagnosis not present

## 2021-04-01 DIAGNOSIS — E108 Type 1 diabetes mellitus with unspecified complications: Secondary | ICD-10-CM | POA: Diagnosis not present

## 2021-04-02 DIAGNOSIS — T82818A Embolism of vascular prosthetic devices, implants and grafts, initial encounter: Secondary | ICD-10-CM | POA: Diagnosis not present

## 2021-04-02 DIAGNOSIS — D509 Iron deficiency anemia, unspecified: Secondary | ICD-10-CM | POA: Diagnosis not present

## 2021-04-02 DIAGNOSIS — R197 Diarrhea, unspecified: Secondary | ICD-10-CM | POA: Diagnosis not present

## 2021-04-02 DIAGNOSIS — D689 Coagulation defect, unspecified: Secondary | ICD-10-CM | POA: Diagnosis not present

## 2021-04-02 DIAGNOSIS — Z992 Dependence on renal dialysis: Secondary | ICD-10-CM | POA: Diagnosis not present

## 2021-04-02 DIAGNOSIS — E1022 Type 1 diabetes mellitus with diabetic chronic kidney disease: Secondary | ICD-10-CM | POA: Diagnosis not present

## 2021-04-02 DIAGNOSIS — N2581 Secondary hyperparathyroidism of renal origin: Secondary | ICD-10-CM | POA: Diagnosis not present

## 2021-04-02 DIAGNOSIS — N186 End stage renal disease: Secondary | ICD-10-CM | POA: Diagnosis not present

## 2021-04-02 DIAGNOSIS — D631 Anemia in chronic kidney disease: Secondary | ICD-10-CM | POA: Diagnosis not present

## 2021-04-05 DIAGNOSIS — E1022 Type 1 diabetes mellitus with diabetic chronic kidney disease: Secondary | ICD-10-CM | POA: Diagnosis not present

## 2021-04-05 DIAGNOSIS — D509 Iron deficiency anemia, unspecified: Secondary | ICD-10-CM | POA: Diagnosis not present

## 2021-04-05 DIAGNOSIS — R197 Diarrhea, unspecified: Secondary | ICD-10-CM | POA: Diagnosis not present

## 2021-04-05 DIAGNOSIS — Z992 Dependence on renal dialysis: Secondary | ICD-10-CM | POA: Diagnosis not present

## 2021-04-05 DIAGNOSIS — D689 Coagulation defect, unspecified: Secondary | ICD-10-CM | POA: Diagnosis not present

## 2021-04-05 DIAGNOSIS — N2581 Secondary hyperparathyroidism of renal origin: Secondary | ICD-10-CM | POA: Diagnosis not present

## 2021-04-05 DIAGNOSIS — T82818A Embolism of vascular prosthetic devices, implants and grafts, initial encounter: Secondary | ICD-10-CM | POA: Diagnosis not present

## 2021-04-05 DIAGNOSIS — N186 End stage renal disease: Secondary | ICD-10-CM | POA: Diagnosis not present

## 2021-04-05 DIAGNOSIS — D631 Anemia in chronic kidney disease: Secondary | ICD-10-CM | POA: Diagnosis not present

## 2021-04-07 DIAGNOSIS — R197 Diarrhea, unspecified: Secondary | ICD-10-CM | POA: Diagnosis not present

## 2021-04-07 DIAGNOSIS — Z992 Dependence on renal dialysis: Secondary | ICD-10-CM | POA: Diagnosis not present

## 2021-04-07 DIAGNOSIS — T82818A Embolism of vascular prosthetic devices, implants and grafts, initial encounter: Secondary | ICD-10-CM | POA: Diagnosis not present

## 2021-04-07 DIAGNOSIS — D689 Coagulation defect, unspecified: Secondary | ICD-10-CM | POA: Diagnosis not present

## 2021-04-07 DIAGNOSIS — N186 End stage renal disease: Secondary | ICD-10-CM | POA: Diagnosis not present

## 2021-04-07 DIAGNOSIS — D509 Iron deficiency anemia, unspecified: Secondary | ICD-10-CM | POA: Diagnosis not present

## 2021-04-07 DIAGNOSIS — D631 Anemia in chronic kidney disease: Secondary | ICD-10-CM | POA: Diagnosis not present

## 2021-04-07 DIAGNOSIS — E1022 Type 1 diabetes mellitus with diabetic chronic kidney disease: Secondary | ICD-10-CM | POA: Diagnosis not present

## 2021-04-07 DIAGNOSIS — N2581 Secondary hyperparathyroidism of renal origin: Secondary | ICD-10-CM | POA: Diagnosis not present

## 2021-04-09 DIAGNOSIS — N186 End stage renal disease: Secondary | ICD-10-CM | POA: Diagnosis not present

## 2021-04-09 DIAGNOSIS — D631 Anemia in chronic kidney disease: Secondary | ICD-10-CM | POA: Diagnosis not present

## 2021-04-09 DIAGNOSIS — Z992 Dependence on renal dialysis: Secondary | ICD-10-CM | POA: Diagnosis not present

## 2021-04-09 DIAGNOSIS — D509 Iron deficiency anemia, unspecified: Secondary | ICD-10-CM | POA: Diagnosis not present

## 2021-04-09 DIAGNOSIS — N2581 Secondary hyperparathyroidism of renal origin: Secondary | ICD-10-CM | POA: Diagnosis not present

## 2021-04-09 DIAGNOSIS — T82818A Embolism of vascular prosthetic devices, implants and grafts, initial encounter: Secondary | ICD-10-CM | POA: Diagnosis not present

## 2021-04-09 DIAGNOSIS — D689 Coagulation defect, unspecified: Secondary | ICD-10-CM | POA: Diagnosis not present

## 2021-04-09 DIAGNOSIS — R197 Diarrhea, unspecified: Secondary | ICD-10-CM | POA: Diagnosis not present

## 2021-04-09 DIAGNOSIS — E1022 Type 1 diabetes mellitus with diabetic chronic kidney disease: Secondary | ICD-10-CM | POA: Diagnosis not present

## 2021-04-12 DIAGNOSIS — R197 Diarrhea, unspecified: Secondary | ICD-10-CM | POA: Diagnosis not present

## 2021-04-12 DIAGNOSIS — T82818A Embolism of vascular prosthetic devices, implants and grafts, initial encounter: Secondary | ICD-10-CM | POA: Diagnosis not present

## 2021-04-12 DIAGNOSIS — Z992 Dependence on renal dialysis: Secondary | ICD-10-CM | POA: Diagnosis not present

## 2021-04-12 DIAGNOSIS — N2581 Secondary hyperparathyroidism of renal origin: Secondary | ICD-10-CM | POA: Diagnosis not present

## 2021-04-12 DIAGNOSIS — D509 Iron deficiency anemia, unspecified: Secondary | ICD-10-CM | POA: Diagnosis not present

## 2021-04-12 DIAGNOSIS — N186 End stage renal disease: Secondary | ICD-10-CM | POA: Diagnosis not present

## 2021-04-12 DIAGNOSIS — E1022 Type 1 diabetes mellitus with diabetic chronic kidney disease: Secondary | ICD-10-CM | POA: Diagnosis not present

## 2021-04-12 DIAGNOSIS — D631 Anemia in chronic kidney disease: Secondary | ICD-10-CM | POA: Diagnosis not present

## 2021-04-12 DIAGNOSIS — D689 Coagulation defect, unspecified: Secondary | ICD-10-CM | POA: Diagnosis not present

## 2021-04-14 DIAGNOSIS — D689 Coagulation defect, unspecified: Secondary | ICD-10-CM | POA: Diagnosis not present

## 2021-04-14 DIAGNOSIS — N2581 Secondary hyperparathyroidism of renal origin: Secondary | ICD-10-CM | POA: Diagnosis not present

## 2021-04-14 DIAGNOSIS — D631 Anemia in chronic kidney disease: Secondary | ICD-10-CM | POA: Diagnosis not present

## 2021-04-14 DIAGNOSIS — N186 End stage renal disease: Secondary | ICD-10-CM | POA: Diagnosis not present

## 2021-04-14 DIAGNOSIS — R197 Diarrhea, unspecified: Secondary | ICD-10-CM | POA: Diagnosis not present

## 2021-04-14 DIAGNOSIS — Z992 Dependence on renal dialysis: Secondary | ICD-10-CM | POA: Diagnosis not present

## 2021-04-14 DIAGNOSIS — E1022 Type 1 diabetes mellitus with diabetic chronic kidney disease: Secondary | ICD-10-CM | POA: Diagnosis not present

## 2021-04-14 DIAGNOSIS — D509 Iron deficiency anemia, unspecified: Secondary | ICD-10-CM | POA: Diagnosis not present

## 2021-04-14 DIAGNOSIS — T82818A Embolism of vascular prosthetic devices, implants and grafts, initial encounter: Secondary | ICD-10-CM | POA: Diagnosis not present

## 2021-04-16 DIAGNOSIS — T82818A Embolism of vascular prosthetic devices, implants and grafts, initial encounter: Secondary | ICD-10-CM | POA: Diagnosis not present

## 2021-04-16 DIAGNOSIS — D689 Coagulation defect, unspecified: Secondary | ICD-10-CM | POA: Diagnosis not present

## 2021-04-16 DIAGNOSIS — N2581 Secondary hyperparathyroidism of renal origin: Secondary | ICD-10-CM | POA: Diagnosis not present

## 2021-04-16 DIAGNOSIS — E1022 Type 1 diabetes mellitus with diabetic chronic kidney disease: Secondary | ICD-10-CM | POA: Diagnosis not present

## 2021-04-16 DIAGNOSIS — Z992 Dependence on renal dialysis: Secondary | ICD-10-CM | POA: Diagnosis not present

## 2021-04-16 DIAGNOSIS — D509 Iron deficiency anemia, unspecified: Secondary | ICD-10-CM | POA: Diagnosis not present

## 2021-04-16 DIAGNOSIS — N186 End stage renal disease: Secondary | ICD-10-CM | POA: Diagnosis not present

## 2021-04-16 DIAGNOSIS — R197 Diarrhea, unspecified: Secondary | ICD-10-CM | POA: Diagnosis not present

## 2021-04-16 DIAGNOSIS — D631 Anemia in chronic kidney disease: Secondary | ICD-10-CM | POA: Diagnosis not present

## 2021-04-19 DIAGNOSIS — Z992 Dependence on renal dialysis: Secondary | ICD-10-CM | POA: Diagnosis not present

## 2021-04-19 DIAGNOSIS — N186 End stage renal disease: Secondary | ICD-10-CM | POA: Diagnosis not present

## 2021-04-19 DIAGNOSIS — D689 Coagulation defect, unspecified: Secondary | ICD-10-CM | POA: Diagnosis not present

## 2021-04-19 DIAGNOSIS — D631 Anemia in chronic kidney disease: Secondary | ICD-10-CM | POA: Diagnosis not present

## 2021-04-19 DIAGNOSIS — N2581 Secondary hyperparathyroidism of renal origin: Secondary | ICD-10-CM | POA: Diagnosis not present

## 2021-04-19 DIAGNOSIS — D509 Iron deficiency anemia, unspecified: Secondary | ICD-10-CM | POA: Diagnosis not present

## 2021-04-19 DIAGNOSIS — E1022 Type 1 diabetes mellitus with diabetic chronic kidney disease: Secondary | ICD-10-CM | POA: Diagnosis not present

## 2021-04-19 DIAGNOSIS — R197 Diarrhea, unspecified: Secondary | ICD-10-CM | POA: Diagnosis not present

## 2021-04-19 DIAGNOSIS — T82818A Embolism of vascular prosthetic devices, implants and grafts, initial encounter: Secondary | ICD-10-CM | POA: Diagnosis not present

## 2021-04-21 DIAGNOSIS — N2581 Secondary hyperparathyroidism of renal origin: Secondary | ICD-10-CM | POA: Diagnosis not present

## 2021-04-21 DIAGNOSIS — D631 Anemia in chronic kidney disease: Secondary | ICD-10-CM | POA: Diagnosis not present

## 2021-04-21 DIAGNOSIS — E1022 Type 1 diabetes mellitus with diabetic chronic kidney disease: Secondary | ICD-10-CM | POA: Diagnosis not present

## 2021-04-21 DIAGNOSIS — N186 End stage renal disease: Secondary | ICD-10-CM | POA: Diagnosis not present

## 2021-04-21 DIAGNOSIS — D689 Coagulation defect, unspecified: Secondary | ICD-10-CM | POA: Diagnosis not present

## 2021-04-21 DIAGNOSIS — T82818A Embolism of vascular prosthetic devices, implants and grafts, initial encounter: Secondary | ICD-10-CM | POA: Diagnosis not present

## 2021-04-21 DIAGNOSIS — D509 Iron deficiency anemia, unspecified: Secondary | ICD-10-CM | POA: Diagnosis not present

## 2021-04-21 DIAGNOSIS — Z992 Dependence on renal dialysis: Secondary | ICD-10-CM | POA: Diagnosis not present

## 2021-04-21 DIAGNOSIS — R197 Diarrhea, unspecified: Secondary | ICD-10-CM | POA: Diagnosis not present

## 2021-04-23 DIAGNOSIS — Z940 Kidney transplant status: Secondary | ICD-10-CM | POA: Diagnosis not present

## 2021-04-23 DIAGNOSIS — E1022 Type 1 diabetes mellitus with diabetic chronic kidney disease: Secondary | ICD-10-CM | POA: Diagnosis not present

## 2021-04-23 DIAGNOSIS — D631 Anemia in chronic kidney disease: Secondary | ICD-10-CM | POA: Diagnosis not present

## 2021-04-23 DIAGNOSIS — I517 Cardiomegaly: Secondary | ICD-10-CM | POA: Diagnosis not present

## 2021-04-23 DIAGNOSIS — J111 Influenza due to unidentified influenza virus with other respiratory manifestations: Secondary | ICD-10-CM | POA: Diagnosis not present

## 2021-04-23 DIAGNOSIS — R197 Diarrhea, unspecified: Secondary | ICD-10-CM | POA: Diagnosis not present

## 2021-04-23 DIAGNOSIS — R519 Headache, unspecified: Secondary | ICD-10-CM | POA: Diagnosis not present

## 2021-04-23 DIAGNOSIS — Z992 Dependence on renal dialysis: Secondary | ICD-10-CM | POA: Diagnosis not present

## 2021-04-23 DIAGNOSIS — R059 Cough, unspecified: Secondary | ICD-10-CM | POA: Diagnosis not present

## 2021-04-23 DIAGNOSIS — N2581 Secondary hyperparathyroidism of renal origin: Secondary | ICD-10-CM | POA: Diagnosis not present

## 2021-04-23 DIAGNOSIS — T82818A Embolism of vascular prosthetic devices, implants and grafts, initial encounter: Secondary | ICD-10-CM | POA: Diagnosis not present

## 2021-04-23 DIAGNOSIS — R0989 Other specified symptoms and signs involving the circulatory and respiratory systems: Secondary | ICD-10-CM | POA: Diagnosis not present

## 2021-04-23 DIAGNOSIS — N186 End stage renal disease: Secondary | ICD-10-CM | POA: Diagnosis not present

## 2021-04-23 DIAGNOSIS — D509 Iron deficiency anemia, unspecified: Secondary | ICD-10-CM | POA: Diagnosis not present

## 2021-04-23 DIAGNOSIS — R0981 Nasal congestion: Secondary | ICD-10-CM | POA: Diagnosis not present

## 2021-04-23 DIAGNOSIS — D689 Coagulation defect, unspecified: Secondary | ICD-10-CM | POA: Diagnosis not present

## 2021-04-23 DIAGNOSIS — Z20822 Contact with and (suspected) exposure to covid-19: Secondary | ICD-10-CM | POA: Diagnosis not present

## 2021-04-24 DIAGNOSIS — Z992 Dependence on renal dialysis: Secondary | ICD-10-CM | POA: Diagnosis not present

## 2021-04-24 DIAGNOSIS — T8612 Kidney transplant failure: Secondary | ICD-10-CM | POA: Diagnosis not present

## 2021-04-24 DIAGNOSIS — N186 End stage renal disease: Secondary | ICD-10-CM | POA: Diagnosis not present

## 2021-04-26 DIAGNOSIS — N186 End stage renal disease: Secondary | ICD-10-CM | POA: Diagnosis not present

## 2021-04-26 DIAGNOSIS — D689 Coagulation defect, unspecified: Secondary | ICD-10-CM | POA: Diagnosis not present

## 2021-04-26 DIAGNOSIS — T82818A Embolism of vascular prosthetic devices, implants and grafts, initial encounter: Secondary | ICD-10-CM | POA: Diagnosis not present

## 2021-04-26 DIAGNOSIS — N2581 Secondary hyperparathyroidism of renal origin: Secondary | ICD-10-CM | POA: Diagnosis not present

## 2021-04-26 DIAGNOSIS — Z992 Dependence on renal dialysis: Secondary | ICD-10-CM | POA: Diagnosis not present

## 2021-04-26 DIAGNOSIS — D509 Iron deficiency anemia, unspecified: Secondary | ICD-10-CM | POA: Diagnosis not present

## 2021-04-26 DIAGNOSIS — E1022 Type 1 diabetes mellitus with diabetic chronic kidney disease: Secondary | ICD-10-CM | POA: Diagnosis not present

## 2021-04-26 DIAGNOSIS — D631 Anemia in chronic kidney disease: Secondary | ICD-10-CM | POA: Diagnosis not present

## 2021-04-27 ENCOUNTER — Encounter: Payer: Self-pay | Admitting: Internal Medicine

## 2021-04-27 ENCOUNTER — Other Ambulatory Visit: Payer: Self-pay

## 2021-04-27 ENCOUNTER — Ambulatory Visit: Payer: Medicare Other | Admitting: Internal Medicine

## 2021-04-27 VITALS — BP 124/60 | HR 86 | Temp 98.2°F | Ht 68.0 in | Wt 246.0 lb

## 2021-04-27 DIAGNOSIS — R058 Other specified cough: Secondary | ICD-10-CM | POA: Diagnosis not present

## 2021-04-27 MED ORDER — METHYLPREDNISOLONE ACETATE 80 MG/ML IJ SUSP
120.0000 mg | Freq: Once | INTRAMUSCULAR | Status: AC
  Administered 2021-04-27: 120 mg via INTRAMUSCULAR

## 2021-04-27 NOTE — Patient Instructions (Addendum)
For cough / congestion > mucinex DM 1200 mg one twice daily as needed with flutter as much as possible   While coughing add gabapentin 300 mg in am until cough much better   Depomedrol 120 mg IM   Zpak  For short of breath > albuterol 2 puffs every 4 hours as needed   GERD (REFLUX)  is an extremely common cause of respiratory symptoms just like yours , many times with no obvious heartburn at all.    It can be treated with medication, but also with lifestyle changes including elevation of the head of your bed (ideally with 6 -8inch blocks under the headboard of your bed),  Smoking cessation, avoidance of late meals, excessive alcohol, and avoid fatty foods, chocolate, peppermint, colas, red wine, and acidic juices such as orange juice.  NO MINT OR MENTHOL PRODUCTS SO NO COUGH DROPS  USE SUGARLESS CANDY INSTEAD (Jolley ranchers or Stover's or Life Savers) or even ice chips will also do - the key is to swallow to prevent all throat clearing. NO OIL BASED VITAMINS - use powdered substitutes.  Avoid fish oil when coughing.   If not all better in 2 weeks return to clinic with your inhalers

## 2021-04-27 NOTE — Progress Notes (Signed)
@Patient  ID: Dale [name], male    DOB: 09/18/1965     MRN: [id number]    HPI: 20 yowm MO/ never smoker  with known DM and ESRD on Dialysis and Grade I diastolic dysfunction by Echo 11/01/16 . Seen for pulmonary consult during hospitalization in 12/2015 for acute resp failure and  post op stridor found to have tracheomalacia on CT neck.    TESTS  12/2015 >CT neck , tracheomalacia , highly atelectatic appearance of trachea.  01/29/16 HRCT Chest >very mild patchy GG attenuation, favored for mild interstitial edema. Severe tracheobronchomalacia.   Sleep study 03/02/2016 >Severe OSA (This study showed severe obstructive sleep apnea with an AH of 76 and SaO2 low of 56%. -  rec  trial of BiPAP therapy on 23/19 cm H2O.   06/22/2017 NP Follow up : OSA   he returns for a follow-up.  Patient was seen previously in 2017 for suspected sleep apnea.  He was set up for a sleep study done on November eighth 2017.  This showed severe sleep apnea with an AHI at 76, SaO2 low at 56%.  He was recommended to begin on BiPAP.  Patient says he did get his BiPAP machine but says he could not tolerate this.  He has severe night terrors claustrophobia.  He ended up returning this back to the DME company.  He did not follow-up since 2017.  Patient is on dialysis.  He is in a wheelchair with previous left leg amputation with prosthesis.  Says it is hard for him to get to appointments due to chronic pain and is on daily narcotics.[name] Kitchen rec Set up for CPAP titration study. > changed to split night study and not done as of 08/10/2017  Follow up in 2 months with Dr. Halford Chessman  In [facility]     08/10/2017 acute extended ov/Dale [name] re: sob/ desats Chief Complaint  Patient presents with   Acute Visit    Dropping O2 for 3 weeks. O2 has been in the lower 70s-upper 80s. Productive cough with clear mucus. Denies any fever.   sleeping propped up 30-40 degrees and 3lpm just at hs  One week prior to OV  Noted worse sats and need for daytime 02  when bending over in w/c (he is not ambulatory) assoc with hacking cough but minimal mucoid sputum production rec Pantoprazole (protonix) 40 mg  Take 30- 60 min before your first and last meals of the day  GERD diet  Please see patient coordinator before you leave today  to schedule sleep study and ambulatory 02 titration to see if eligible for POC  Need to pull off as much fluid as you can at dialysis    07/24/2020  f/u ov/Dale [name] re:  MO/ HD dep  Worse doe  Chief Complaint  Patient presents with   Follow-up    Sob worse since last visit. When taking a deep breathe, having pain on right side of chest. 2L continuous.   Dyspnea:  Limited by R knee pain/ L BKA / parks w/c at door of br at home and walks into br 2lpm is easier than without it  Cough not much  Sleeping: as below SABA use: none  02: 2lpm 24/7  incliner 60 - 90  Degrees x for a year / no daytime hypersomnolence  Covid status:   Moderna x 3 and infected as well 05/08/20  R CP  X sev months every time he takes a deep Reports "foam in mouth after supper"  x sev months/ some dysphagia s overt sinus complaints  Rec Take pepcid 20 mg after supper  GERD diet reviewed, bed blocks rec  Please schedule a follow up visit in 3 months but call sooner if needed     04/27/2021  f/u ov/Dale [name] re: MO/HD  was better p last ov, able to sleep with angle down to  30 degrees on sleep number bed until one week prior to OV  Then flare of cough in setting of ST/cough yellow > 04/23/21 UC neg cxr/  neg virus panel >>> rx tamiflu and inhaler ? Which one  Chief Complaint  Patient presents with   Acute visit     Increased cough, wheezing, chest tightness for the past wk. He is coughing up yellow sputum.   Dyspnea:  p coughing  Cough: harsh honking  min rattling sounding cough  Sleeping: at 60 degrees  SABA use: right before ov helped some 02:2lpm hs Covid status:   vax x 4    No obvious day to day or daytime variability or assoc excess/ purulent sputum  or mucus plugs or hemoptysis or cp or chest tightness, subjective wheeze or overt sinus or hb symptoms.     Also denies any obvious fluctuation of symptoms with weather or environmental changes or other aggravating or alleviating factors except as outlined above   No unusual exposure hx or h/o childhood pna/ asthma or knowledge of premature birth.  Current Allergies, Complete Past Medical History, Past Surgical History, Family History, and Social History were reviewed in [employer] record.  ROS  The following are not active complaints unless bolded Hoarseness, sore throat, dysphagia, dental problems, itching, sneezing,  nasal congestion or discharge of excess mucus or purulent secretions, ear ache,   fever, chills, sweats, unintended wt loss or wt gain, classically pleuritic or exertional cp,  orthopnea pnd or arm/hand swelling  or leg swelling, presyncope, palpitations, abdominal pain, anorexia, nausea, vomiting, diarrhea  or change in bowel habits or change in bladder habits, change in stools or change in urine, dysuria, hematuria,  rash, arthralgias, visual complaints, headache, numbness, weakness or ataxia or problems with walking or coordination,  change in mood or  memory.        Current Meds  Medication Sig   acetaminophen (TYLENOL) 500 MG tablet Take 1,000 mg by mouth every 6 (six) hours as needed (pain/headaches.).    allopurinol (ZYLOPRIM) 100 MG tablet Take 200 mg by mouth daily. 2 tabs daily   amLODipine (NORVASC) 5 MG tablet Take 5 mg by mouth daily.   aspirin EC 81 MG tablet Take 81 mg by mouth at bedtime.   atorvastatin (LIPITOR) 20 MG tablet Take 20 mg by mouth daily.   B Complex-C-Folic Acid (DIALYVITE 160) 0.8 MG TABS Take 1 tablet by mouth daily.   calcium acetate (PHOSLO) 667 MG capsule Take 2,001 mg by mouth 3 (three) times daily with meals.   cetirizine (ZYRTEC) 10 MG tablet Take 10 mg by mouth daily.   Doxercalciferol (HECTOROL IV) as directed.  dialysis center   fluticasone (FLONASE) 50 MCG/ACT nasal spray Place 2 sprays into both nostrils daily as needed for allergies.    gabapentin (NEURONTIN) 300 MG capsule Take 1 capsule (300 mg total) by mouth 2 (two) times daily as needed.   GLUCAGON EMERGENCY 1 MG injection Inject 1 mg into the muscle daily as needed (low blood sugar).   Glucose-Cholecalciferol (RELION GLUCOSE SHOT) LIQD Take 1 Dose by mouth daily as needed (low  blood sugar).   HYDROmorphone (DILAUDID) 2 MG tablet Take 1 tablet (2 mg total) by mouth daily as needed for severe pain.   Insulin Disposable Pump (OMNIPOD 5 G6 POD, GEN 5,) MISC by Does not apply route.   insulin lispro (HUMALOG) 100 UNIT/ML injection Inject 75-120 Units into the skin continuous. Per insulin pump   levothyroxine (SYNTHROID, LEVOTHROID) 112 MCG tablet Take 112 mcg by mouth at bedtime.    loperamide (IMODIUM A-D) 2 MG tablet Take 2-4 mg by mouth 4 (four) times daily as needed for diarrhea or loose stools.   Melatonin 5 MG TABS Take 5 mg by mouth at bedtime.   Methoxy PEG-Epoetin Beta (MIRCERA IJ) as directed. dialysis center   mupirocin ointment (BACTROBAN) 2 % as needed.   nitroGLYCERIN (NITROSTAT) 0.4 MG SL tablet Place 1 tablet (0.4 mg total) under the tongue every 5 (five) minutes as needed for chest pain.   omeprazole (PRILOSEC) 40 MG capsule Take 1 capsule by mouth 2 (two) times daily.   Polyethyl Glycol-Propyl Glycol (SYSTANE ULTRA PF OP) Place 1 drop into both eyes 4 (four) times daily as needed (dry eyes).   polyethylene glycol (MIRALAX / GLYCOLAX) 17 g packet Take 17 g by mouth daily as needed.   prasugrel (EFFIENT) 10 MG TABS tablet One tablet by mouth ( 10 mg ) daily.   promethazine (PHENERGAN) 25 MG tablet Take 25 mg by mouth every 6 (six) hours as needed for nausea or vomiting.   rOPINIRole (REQUIP) 2 MG tablet Take 2 mg by mouth every evening.   sevelamer carbonate (RENVELA) 800 MG tablet Take 1,600 mg by mouth 3 (three) times daily with  meals.   testosterone cypionate (DEPOTESTOSTERONE CYPIONATE) 200 MG/ML injection Inject 200 mg into the muscle every 14 (fourteen) days.    triamcinolone cream (KENALOG) 0.1 % Apply 1 application topically as needed.                                Physical Exam    04/27/2021         246  07/24/2020         251 reported   07/27/17 293 lb 9.6 oz (133.2 kg)  07/18/17 285 lb 0.9 oz (129.3 kg)  07/11/17 283 lb (128.4 kg)      Vital signs reviewed  04/27/2021  - Note at rest 02 sats  96% on RA   General appearance:    obese wm in w/c   HEENT : pt wearing mask not removed for exam due to covid -19 concerns.    NECK :  without JVD/Nodes/TM/ nl carotid upstrokes bilaterally   LUNGS: no acc muscle use,  Nl contour chest with insp pops/squeaks both bases, min exp wheeze   CV:  RRR  no s3 or murmur or increase in P2, and no edema   ABD:  obese soft and nontender   SKIN: warm and dry without lesions    NEURO:  alert, approp, nl sensorium with  no motor or cerebellar deficits apparent.     I personally reviewed images and agree with radiology impression as follows:  CXR:   pa and lateral  04/23/21 1. Stable cardiomegaly.  2. Improved aeration.  3. Suspect small pleural effusions.   Assessment & Plan:

## 2021-04-28 ENCOUNTER — Telehealth: Payer: Self-pay | Admitting: Internal Medicine

## 2021-04-28 ENCOUNTER — Encounter: Payer: Self-pay | Admitting: Internal Medicine

## 2021-04-28 DIAGNOSIS — J811 Chronic pulmonary edema: Secondary | ICD-10-CM | POA: Diagnosis not present

## 2021-04-28 DIAGNOSIS — D689 Coagulation defect, unspecified: Secondary | ICD-10-CM | POA: Diagnosis not present

## 2021-04-28 DIAGNOSIS — R059 Cough, unspecified: Secondary | ICD-10-CM | POA: Diagnosis not present

## 2021-04-28 DIAGNOSIS — R531 Weakness: Secondary | ICD-10-CM | POA: Diagnosis not present

## 2021-04-28 DIAGNOSIS — Z992 Dependence on renal dialysis: Secondary | ICD-10-CM | POA: Diagnosis not present

## 2021-04-28 DIAGNOSIS — D631 Anemia in chronic kidney disease: Secondary | ICD-10-CM | POA: Diagnosis not present

## 2021-04-28 DIAGNOSIS — Z5321 Procedure and treatment not carried out due to patient leaving prior to being seen by health care provider: Secondary | ICD-10-CM | POA: Diagnosis not present

## 2021-04-28 DIAGNOSIS — D509 Iron deficiency anemia, unspecified: Secondary | ICD-10-CM | POA: Diagnosis not present

## 2021-04-28 DIAGNOSIS — R1110 Vomiting, unspecified: Secondary | ICD-10-CM | POA: Diagnosis not present

## 2021-04-28 DIAGNOSIS — T82818A Embolism of vascular prosthetic devices, implants and grafts, initial encounter: Secondary | ICD-10-CM | POA: Diagnosis not present

## 2021-04-28 DIAGNOSIS — R918 Other nonspecific abnormal finding of lung field: Secondary | ICD-10-CM | POA: Diagnosis not present

## 2021-04-28 DIAGNOSIS — R110 Nausea: Secondary | ICD-10-CM | POA: Diagnosis not present

## 2021-04-28 DIAGNOSIS — I517 Cardiomegaly: Secondary | ICD-10-CM | POA: Diagnosis not present

## 2021-04-28 DIAGNOSIS — I10 Essential (primary) hypertension: Secondary | ICD-10-CM | POA: Diagnosis not present

## 2021-04-28 DIAGNOSIS — N2581 Secondary hyperparathyroidism of renal origin: Secondary | ICD-10-CM | POA: Diagnosis not present

## 2021-04-28 DIAGNOSIS — R5383 Other fatigue: Secondary | ICD-10-CM | POA: Diagnosis not present

## 2021-04-28 DIAGNOSIS — N186 End stage renal disease: Secondary | ICD-10-CM | POA: Diagnosis not present

## 2021-04-28 DIAGNOSIS — R112 Nausea with vomiting, unspecified: Secondary | ICD-10-CM | POA: Diagnosis not present

## 2021-04-28 DIAGNOSIS — E1022 Type 1 diabetes mellitus with diabetic chronic kidney disease: Secondary | ICD-10-CM | POA: Diagnosis not present

## 2021-04-28 DIAGNOSIS — Z20822 Contact with and (suspected) exposure to covid-19: Secondary | ICD-10-CM | POA: Diagnosis not present

## 2021-04-28 DIAGNOSIS — R1111 Vomiting without nausea: Secondary | ICD-10-CM | POA: Diagnosis not present

## 2021-04-28 MED ORDER — AZITHROMYCIN 250 MG PO TABS: ORAL_TABLET | ORAL | 0 refills | Status: AC

## 2021-04-28 NOTE — Telephone Encounter (Signed)
I called the patient and he is aware that I have sent in the Z pack per the last office note on 04/27/21 per Dr. Melvyn Novas. Nothing further needed.

## 2021-04-28 NOTE — Assessment & Plan Note (Signed)
Acute flare in setting of uri with little to support asthma but ok for now to use albuterol   Of the three most common causes of  Sub-acute / recurrent or chronic cough, only one (GERD)  can actually contribute to/ trigger  the other two (asthma and post nasal drip syndrome)  and perpetuate the cylce of cough.  While not intuitively obvious, many patients with chronic low grade reflux do not cough until there is a primary insult that disturbs the protective epithelial barrier and exposes sensitive nerve endings.   This is typically viral but can due to PNDS and  either may apply here.   The point is that once this occurs, it is difficult to eliminate the cycle  using anything but a maximally effective acid suppression regimen at least in the short run, accompanied by an appropriate diet to address non acid GERD and control / eliminate the cough itself for at least 3 days by increasing gabapentin to 300 mg bid, using mucinex dm with flutter valve,  Max rx for gerd plus >>> also so added depomedrol 120 mg IM tdoay case of component of Th-2 driven upper or lower airways inflammation (if cough responds short term only to relapse before return while will on full rx for uacs (as above), then  that would point to allergic rhinitis/ asthma or eos bronchitis as alternative dx)    >>> f/u in 2 weeks with all inhalers to regroup re longterm rx         Each maintenance medication was reviewed in detail including emphasizing most importantly the difference between maintenance and prns and under what circumstances the prns are to be triggered using an action plan format where appropriate.  Total time for H and P, chart review, counseling, reviewing hfa device(s) and generating customized AVS unique to this office visit / same day charting = 22 min

## 2021-04-30 DIAGNOSIS — D689 Coagulation defect, unspecified: Secondary | ICD-10-CM | POA: Diagnosis not present

## 2021-04-30 DIAGNOSIS — T82818A Embolism of vascular prosthetic devices, implants and grafts, initial encounter: Secondary | ICD-10-CM | POA: Diagnosis not present

## 2021-04-30 DIAGNOSIS — N2581 Secondary hyperparathyroidism of renal origin: Secondary | ICD-10-CM | POA: Diagnosis not present

## 2021-04-30 DIAGNOSIS — D509 Iron deficiency anemia, unspecified: Secondary | ICD-10-CM | POA: Diagnosis not present

## 2021-04-30 DIAGNOSIS — E1022 Type 1 diabetes mellitus with diabetic chronic kidney disease: Secondary | ICD-10-CM | POA: Diagnosis not present

## 2021-04-30 DIAGNOSIS — D631 Anemia in chronic kidney disease: Secondary | ICD-10-CM | POA: Diagnosis not present

## 2021-04-30 DIAGNOSIS — Z992 Dependence on renal dialysis: Secondary | ICD-10-CM | POA: Diagnosis not present

## 2021-04-30 DIAGNOSIS — N186 End stage renal disease: Secondary | ICD-10-CM | POA: Diagnosis not present

## 2021-05-01 DIAGNOSIS — E108 Type 1 diabetes mellitus with unspecified complications: Secondary | ICD-10-CM | POA: Diagnosis not present

## 2021-05-02 ENCOUNTER — Telehealth: Payer: Self-pay | Admitting: Internal Medicine

## 2021-05-02 DIAGNOSIS — R058 Other specified cough: Secondary | ICD-10-CM

## 2021-05-02 NOTE — Telephone Encounter (Signed)
Choking spell woke him up from nap and coughed up about 2 tbsp mucoid sputum from back of throat assoc with nasal congestion and sense of pnds no better p depomerol rx and he has trachemalacia but says fltter not helping during spells of choking   Rec: Sleep eval sood ENT consult re throat "congestion"  Sinus CT dx chronic cough

## 2021-05-03 DIAGNOSIS — D631 Anemia in chronic kidney disease: Secondary | ICD-10-CM | POA: Diagnosis not present

## 2021-05-03 DIAGNOSIS — D509 Iron deficiency anemia, unspecified: Secondary | ICD-10-CM | POA: Diagnosis not present

## 2021-05-03 DIAGNOSIS — T82818A Embolism of vascular prosthetic devices, implants and grafts, initial encounter: Secondary | ICD-10-CM | POA: Diagnosis not present

## 2021-05-03 DIAGNOSIS — N2581 Secondary hyperparathyroidism of renal origin: Secondary | ICD-10-CM | POA: Diagnosis not present

## 2021-05-03 DIAGNOSIS — D689 Coagulation defect, unspecified: Secondary | ICD-10-CM | POA: Diagnosis not present

## 2021-05-03 DIAGNOSIS — Z992 Dependence on renal dialysis: Secondary | ICD-10-CM | POA: Diagnosis not present

## 2021-05-03 DIAGNOSIS — E1022 Type 1 diabetes mellitus with diabetic chronic kidney disease: Secondary | ICD-10-CM | POA: Diagnosis not present

## 2021-05-03 DIAGNOSIS — N186 End stage renal disease: Secondary | ICD-10-CM | POA: Diagnosis not present

## 2021-05-03 NOTE — Telephone Encounter (Signed)
ENT ref and sinus scan ordered  I spoke with the pt and set him up for sleep consult with VS  Nothing further needed

## 2021-05-05 DIAGNOSIS — Z992 Dependence on renal dialysis: Secondary | ICD-10-CM | POA: Diagnosis not present

## 2021-05-05 DIAGNOSIS — N2581 Secondary hyperparathyroidism of renal origin: Secondary | ICD-10-CM | POA: Diagnosis not present

## 2021-05-05 DIAGNOSIS — N186 End stage renal disease: Secondary | ICD-10-CM | POA: Diagnosis not present

## 2021-05-05 DIAGNOSIS — D631 Anemia in chronic kidney disease: Secondary | ICD-10-CM | POA: Diagnosis not present

## 2021-05-05 DIAGNOSIS — D689 Coagulation defect, unspecified: Secondary | ICD-10-CM | POA: Diagnosis not present

## 2021-05-05 DIAGNOSIS — T82818A Embolism of vascular prosthetic devices, implants and grafts, initial encounter: Secondary | ICD-10-CM | POA: Diagnosis not present

## 2021-05-05 DIAGNOSIS — E1022 Type 1 diabetes mellitus with diabetic chronic kidney disease: Secondary | ICD-10-CM | POA: Diagnosis not present

## 2021-05-05 DIAGNOSIS — D509 Iron deficiency anemia, unspecified: Secondary | ICD-10-CM | POA: Diagnosis not present

## 2021-05-07 DIAGNOSIS — D689 Coagulation defect, unspecified: Secondary | ICD-10-CM | POA: Diagnosis not present

## 2021-05-07 DIAGNOSIS — N186 End stage renal disease: Secondary | ICD-10-CM | POA: Diagnosis not present

## 2021-05-07 DIAGNOSIS — T82818A Embolism of vascular prosthetic devices, implants and grafts, initial encounter: Secondary | ICD-10-CM | POA: Diagnosis not present

## 2021-05-07 DIAGNOSIS — D631 Anemia in chronic kidney disease: Secondary | ICD-10-CM | POA: Diagnosis not present

## 2021-05-07 DIAGNOSIS — N2581 Secondary hyperparathyroidism of renal origin: Secondary | ICD-10-CM | POA: Diagnosis not present

## 2021-05-07 DIAGNOSIS — D509 Iron deficiency anemia, unspecified: Secondary | ICD-10-CM | POA: Diagnosis not present

## 2021-05-07 DIAGNOSIS — Z992 Dependence on renal dialysis: Secondary | ICD-10-CM | POA: Diagnosis not present

## 2021-05-07 DIAGNOSIS — E1022 Type 1 diabetes mellitus with diabetic chronic kidney disease: Secondary | ICD-10-CM | POA: Diagnosis not present

## 2021-05-10 DIAGNOSIS — T82818A Embolism of vascular prosthetic devices, implants and grafts, initial encounter: Secondary | ICD-10-CM | POA: Diagnosis not present

## 2021-05-10 DIAGNOSIS — Z992 Dependence on renal dialysis: Secondary | ICD-10-CM | POA: Diagnosis not present

## 2021-05-10 DIAGNOSIS — N186 End stage renal disease: Secondary | ICD-10-CM | POA: Diagnosis not present

## 2021-05-10 DIAGNOSIS — N2581 Secondary hyperparathyroidism of renal origin: Secondary | ICD-10-CM | POA: Diagnosis not present

## 2021-05-10 DIAGNOSIS — D509 Iron deficiency anemia, unspecified: Secondary | ICD-10-CM | POA: Diagnosis not present

## 2021-05-10 DIAGNOSIS — D631 Anemia in chronic kidney disease: Secondary | ICD-10-CM | POA: Diagnosis not present

## 2021-05-10 DIAGNOSIS — D689 Coagulation defect, unspecified: Secondary | ICD-10-CM | POA: Diagnosis not present

## 2021-05-10 DIAGNOSIS — E1022 Type 1 diabetes mellitus with diabetic chronic kidney disease: Secondary | ICD-10-CM | POA: Diagnosis not present

## 2021-05-12 DIAGNOSIS — N2581 Secondary hyperparathyroidism of renal origin: Secondary | ICD-10-CM | POA: Diagnosis not present

## 2021-05-12 DIAGNOSIS — D689 Coagulation defect, unspecified: Secondary | ICD-10-CM | POA: Diagnosis not present

## 2021-05-12 DIAGNOSIS — D509 Iron deficiency anemia, unspecified: Secondary | ICD-10-CM | POA: Diagnosis not present

## 2021-05-12 DIAGNOSIS — T82818A Embolism of vascular prosthetic devices, implants and grafts, initial encounter: Secondary | ICD-10-CM | POA: Diagnosis not present

## 2021-05-12 DIAGNOSIS — D631 Anemia in chronic kidney disease: Secondary | ICD-10-CM | POA: Diagnosis not present

## 2021-05-12 DIAGNOSIS — E1022 Type 1 diabetes mellitus with diabetic chronic kidney disease: Secondary | ICD-10-CM | POA: Diagnosis not present

## 2021-05-12 DIAGNOSIS — N186 End stage renal disease: Secondary | ICD-10-CM | POA: Diagnosis not present

## 2021-05-12 DIAGNOSIS — Z992 Dependence on renal dialysis: Secondary | ICD-10-CM | POA: Diagnosis not present

## 2021-05-13 NOTE — Progress Notes (Signed)
Chief Complaint  Patient presents with   Follow-up    CAD     History of Present Illness: 56 yo male with history of CAD, DM, HTN, OSA, morbid obesity, ESRD on HD, fibromyalgia, chronic anemia, PAD status post left BKA, venous insufficiency who is here today for cardiac follow up. CAD diagnosed in November 2015. Cardiac cath on 03/19/14 with severe calcified RCA stenosis that was treated with angioplasty and staged rotational atherectomy with placement of a drug eluting stent. The distal RCA lesion was treated with balloon angioplasty alone. Possible allergic reaction to Plavix but did well with Effient. He developed a left groin AV fistula following his cath.  Admitted September 2017 with left arm pain, NSTEMI. Repeat cath in September 2017 with severe disease in the diagonal branch treated with a drug eluting stent. He was discharged on ASA and Brilinta but due to nausea, he was changed to Effient. He has had some chest pain and back pain when his BP is low. He has been on midodrine on dialysis days. Last echo July 2018 with normal LV systolic function, ZOXW=96-04%, grade 1 diastolic dysfunction, moderate LVH, mild TR, trivial MR. He was admitted to [facility] February 2022 with heart failure. His dry weight was lowered at dialysis and he was feeling much better at office follow in June 2022.   He is here today for follow up. The patient denies any chest pain, dyspnea, palpitations, lower extremity edema, orthopnea, PND, dizziness, near syncope or syncope.   Primary care: Donney Dice, DO  Past Medical History:  Diagnosis Date   (HFpEF) heart failure with preserved ejection fraction ([place])    a. Echo 3/13:  EF 55-60%;  b. Echo (11/15):  Mild LVH, EF 60-65%, no RWMA, Gr 1 DD, MAC, mild LAE, normal RVF, mild RAE;  c.  Echo 5/16:  severe LVH, EF 55-60%, no RWMA, Gr 1 DD, MAC, mild LAE // Echo 7/18: EF 60-65, Gr 1 DD   Anal fissure    Anemia of chronic disease    Arthritis     "hands, right knee" (03/19/2014) no cartlidge in right knee   CAD (coronary artery disease)    a. Abnl nuc ->LHC (11/15):  mid to dist Dx 80%, mid RCA 99% (functional CTO), dist RCA 80% >> PCI: balloon angioplasty to mid RCA (could not deliver stent) - staged PCI Rotoblator atherectomy/DES to [facility] b. NSTEMI 12/2015 s/p DES to diagonal.   Cholecystitis    a. 08/27/2011   Claustrophobia    when things get around his face.    Complication of anesthesia    Difficult intubation    only once in 2015 at [facility] haven't had a problem since then   Diverticulosis    Esophageal dysmotility    ESRD (end stage renal disease) ([place])    a. 1995 s/p cadaveric transplant w/ susbequent failure after 18 yrs;  b.Dialysis initiated 07/2011 [place] M-W-F   Fibromyalgia    Gastroparesis    GERD (gastroesophageal reflux disease)    Gout    PMH   History of blood transfusion    History of COVID-19 04/2020   tx with sotrovimab (mAb) and molnupiravir (nucleoside analog)   History of kidney stones    early 1990's   HLD (hyperlipidemia)    Hypertension    Hypothyroidism    Inguinal lymphadenopathy    a. bilateral - s/p biopsy 07/2011   Insulin dependent diabetes mellitus    Type I  Monilial esophagitis (HCC)    Morbid obesity (HCC)    OSA (obstructive sleep apnea)    adjustable bed and uses O2 L at bedtime   PAD (peripheral artery disease) ([place])    a. s/p L BKA.   Pericardial effusion    a.  Small by CT 08/22/11;  b.  Large by CT 08/27/11. c. Not seen on 11/2015 echo.   Pneumonia ~ 2007   PONV (postoperative nausea and vomiting)    vomited once after a procedure.   T1DM (type 1 diabetes mellitus) ([place])    Tracheobronchomalacia     Past Surgical History:  Procedure Laterality Date   A/V FISTULAGRAM Left 07/26/2016   Procedure: A/V Fistulagram;  Surgeon: Waynetta Sandy, MD;  Location: [place] CV LAB;  Service: Cardiovascular;  Laterality: Left;   A/V FISTULAGRAM N/A 07/11/2017    Procedure: A/V FISTULAGRAM - Left AV;  Surgeon: Waynetta Sandy, MD;  Location: [place] CV [facility];  Service: Cardiovascular;  Laterality: N/A;   AMPUTATION OF REPLICATED TOES Right    ANGIOPLASTY  04/09/2014   RCA    APPENDECTOMY  ~ 2006   ARTERIOVENOUS GRAFT PLACEMENT Left 1993?   forearm   ARTERIOVENOUS GRAFT PLACEMENT Right 1993?   leg   ARTERIOVENOUS GRAFT PLACEMENT Left    Thigh   Arteriovenous Graft Removed Left    Thigh   AV FISTULA PLACEMENT  07/29/2011   Procedure: ARTERIOVENOUS (AV) FISTULA CREATION;  Surgeon: Mal Misty, MD;  Location: [place];  Service: Vascular;  Laterality: Right;  Brachial cephalic   AV FISTULA PLACEMENT Left 01/20/2015   Procedure: LIGATION OF RIGHT ARTERIOVENOUS FISTULA WITH EXCISION OF ANEURYSM;  Surgeon: Angelia Mould, MD;  Location: [place];  Service: Vascular;  Laterality: Left;   AV FISTULA PLACEMENT Left 04/30/2015   Procedure: CREATION OF LEFT ARM BRACHIO-CEPHALIC ARTERIOVENOUS (AV) FISTULA ;  Surgeon: Angelia Mould, MD;  Location: [place];  Service: Vascular;  Laterality: Left;   AV Graft PLACEMENT Left 1993?   "attempted one in my wrist; didn't take"   [place] Left 11/01/2018   Procedure: FIRST STAGE BASILIC VEIN TRANSPOSITION  LEFT ARM;  Surgeon: Serafina Mitchell, MD;  Location: [place];  Service: Vascular;  Laterality: Left;   [place] Left 12/27/2018   Procedure: SECOND STAGE BASILIC VEIN TRANSPOSITION  LEFT ARM;  Surgeon: Serafina Mitchell, MD;  Location: [place];  Service: Vascular;  Laterality: Left;   BELOW KNEE LEG AMPUTATION Left 2010   CARDIAC CATHETERIZATION  03/19/2014   CARDIAC CATHETERIZATION  03/19/2014   Procedure: CORONARY BALLOON ANGIOPLASTY;  Surgeon: Burnell Blanks, MD;  Location: [facility] CATH LAB;  Service: Cardiovascular;;   CARDIAC CATHETERIZATION N/A 01/01/2016   Procedure: Left Heart Cath and Coronary Angiography;  Surgeon: Troy Sine, MD;  Location: [place] CV  LAB;  Service: Cardiovascular;  Laterality: N/A;   CARDIAC CATHETERIZATION N/A 01/01/2016   Procedure: Coronary Stent Intervention;  Surgeon: Troy Sine, MD;  Location: [place] CV LAB;  Service: Cardiovascular;  Laterality: N/A;   CARPAL TUNNEL RELEASE Bilateral after 2005   CATARACT EXTRACTION W/ INTRAOCULAR LENS  IMPLANT, BILATERAL Bilateral 1990's   COLONOSCOPY  08/29/2011   Procedure: COLONOSCOPY;  Surgeon: Jerene Bears, MD;  Location: [place];  Service: Gastroenterology;  Laterality: N/A;   COLONOSCOPY WITH PROPOFOL N/A 04/26/2016   Procedure: COLONOSCOPY WITH PROPOFOL;  Surgeon: Jerene Bears, MD;  Location: [facility];  Service: Gastroenterology;  Laterality: N/A;  CORONARY ANGIOPLASTY WITH STENT PLACEMENT  04/09/2014       ptca/des mid lad    ESOPHAGOGASTRODUODENOSCOPY N/A 12/25/2015   Procedure: ESOPHAGOGASTRODUODENOSCOPY (EGD);  Surgeon: Mauri Pole, MD;  Location: [facility] ENDOSCOPY;  Service: Endoscopy;  Laterality: N/A;  bedside   ESOPHAGOGASTRODUODENOSCOPY (EGD) WITH PROPOFOL N/A 04/26/2016   Procedure: ESOPHAGOGASTRODUODENOSCOPY (EGD) WITH PROPOFOL;  Surgeon: Jerene Bears, MD;  Location: [facility];  Service: Gastroenterology;  Laterality: N/A;   EYE SURGERY Bilateral    cataract   FEMORAL ARTERY REPAIR  04/09/2014   ANGIOSEAL   INSERTION OF DIALYSIS CATHETER  08/01/2011   Procedure: INSERTION OF DIALYSIS CATHETER;  Surgeon: Rosetta Posner, MD;  Location: [place];  Service: Vascular;  Laterality: Right;  insertion of dialysis catheter on right internal jugular vein   INSERTION OF DIALYSIS CATHETER Right 01/20/2015   Procedure: INSERTION OF DIALYSIS CATHETER - RIGHT INTERNAL JUGULAR ;  Surgeon: Angelia Mould, MD;  Location: [place];  Service: Vascular;  Laterality: Right;   INSERTION OF DIALYSIS CATHETER Right 08/02/2018   Procedure: Insertion Of Dialysis Catheter;  Surgeon: Angelia Mould, MD;  Location: [place];  Service: Vascular;  Laterality: Right;   KIDNEY  TRANSPLANT  04/25/1993   LEFT HEART CATHETERIZATION WITH CORONARY ANGIOGRAM N/A 03/19/2014   Procedure: LEFT HEART CATHETERIZATION WITH CORONARY ANGIOGRAM;  Surgeon: Burnell Blanks, MD;  Location: [facility] C[facility] LAB;  Service: Cardiovascular;  Laterality: N/A;   LIGATION OF ARTERIOVENOUS  FISTULA Left 11/01/2018   Procedure: Ligation Of  Brachio Cephalic Arteriovenous  Fistula;  Surgeon: Serafina Mitchell, MD;  Location: [place];  Service: Vascular;  Laterality: Left;   LIGATION OF ARTERIOVENOUS  FISTULA Left 02/19/2019   Procedure: LIGATION OF ARTERIOVENOUS  FISTULA LEFT ARM;  Surgeon: Angelia Mould, MD;  Location: [place];  Service: Vascular;  Laterality: Left;   LYMPH NODE BIOPSY  08/10/2011   Procedure: LYMPH NODE BIOPSY;  Surgeon: Merrie Roof, MD;  Location: [place];  Service: General;  Laterality: Left;  left groin lymph biopsy   NEUROPLASTY / TRANSPOSITION ULNAR NERVE AT ELBOW Left after 2005   PATCH ANGIOPLASTY Left 08/09/2016   Procedure: LEFT  ARTERIOVENOUS FISTULA PATCH ANGIOPLASTY;  Surgeon: Waynetta Sandy, MD;  Location: [place];  Service: Vascular;  Laterality: Left;   PERCUTANEOUS CORONARY ROTOBLATOR INTERVENTION (PCI-R) N/A 04/09/2014   Procedure: PERCUTANEOUS CORONARY ROTOBLATOR INTERVENTION (PCI-R);  Surgeon: Burnell Blanks, MD;  Location: [facility] CATH LAB;  Service: Cardiovascular;  Laterality: N/A;   PERIPHERAL VASCULAR BALLOON ANGIOPLASTY  07/11/2017   Procedure: PERIPHERAL VASCULAR BALLOON ANGIOPLASTY;  Surgeon: Waynetta Sandy, MD;  Location: [place] CV LAB;  Service: Cardiovascular;;  left arm AV fistula   PERIPHERAL VASCULAR CATHETERIZATION N/A 12/25/2014   Procedure: Upper Extremity Venography;  Surgeon: Conrad North, MD;  Location: [place] CV LAB;  Service: Cardiovascular;  Laterality: N/A;   PERIPHERAL VASCULAR CATHETERIZATION Left 02/24/2016   Procedure: Fistulagram;  Surgeon: Waynetta Sandy, MD;  Location: [place] CV LAB;   Service: Cardiovascular;  Laterality: Left;   PERIPHERAL VASCULAR CATHETERIZATION Left 02/24/2016   Procedure: Peripheral Vascular Intervention;  Surgeon: Waynetta Sandy, MD;  Location: [place] CV LAB;  Service: Cardiovascular;  Laterality: Left;  AV FISTULA   PERITONEAL CATHETER INSERTION     PERITONEAL CATHETER REMOVAL     REVISON OF ARTERIOVENOUS FISTULA Right 6/54/6503   Procedure: PLICATION / REVISION OF ARTERIOVENOUS FISTULA;  Surgeon: Angelia Mould, MD;  Location: [place];  Service: Vascular;  Laterality: Right;   REVISON OF ARTERIOVENOUS FISTULA Left 09/01/2015   Procedure: SUPERFICIALIZATION OF LEFT ARM BRACHIOCEPHALIC ARTERIOVENOUS FISTULA;  Surgeon: Angelia Mould, MD;  Location: [place];  Service: Vascular;  Laterality: Left;   REVISON OF ARTERIOVENOUS FISTULA Left 08/09/2016   Procedure: REVISION OF ARTERIAL ANASTOMOSIS OF ARTERIOVENOUS FISTULA;  Surgeon: Waynetta Sandy, MD;  Location: [place];  Service: Vascular;  Laterality: Left;   SHOULDER ARTHROSCOPY Left ~ 2006   frozen   THROMBECTOMY W/ EMBOLECTOMY Left 08/02/2018   Procedure: THROMBECTOMY ARTERIOVENOUS FISTULA;  Surgeon: Angelia Mould, MD;  Location: [place];  Service: Vascular;  Laterality: Left;   TOE AMPUTATION Bilateral after 5784   UMBILICAL HERNIA REPAIR  1990's   UPPER EXTREMITY VENOGRAPHY Bilateral 10/16/2018   Procedure: UPPER EXTREMITY VENOGRAPHY;  Surgeon: Serafina Mitchell, MD;  Location: [place] CV LAB;  Service: Cardiovascular;  Laterality: Bilateral;   VENOGRAM Right 07/28/2011   Procedure: VENOGRAM;  Surgeon: Conrad [place], MD;  Location: [facility] CATH LAB;  Service: Cardiovascular;  Laterality: Right;   VITRECTOMY Bilateral     Current Outpatient Medications  Medication Sig Dispense Refill   acetaminophen (TYLENOL) 500 MG tablet Take 1,000 mg by mouth every 6 (six) hours as needed (pain/headaches.).      allopurinol (ZYLOPRIM) 100 MG tablet Take 200 mg by mouth daily. 2 tabs  daily     aspirin EC 81 MG tablet Take 81 mg by mouth at bedtime.     atorvastatin (LIPITOR) 20 MG tablet Take 20 mg by mouth daily.     B Complex-C-Folic Acid (DIALYVITE 696) 0.8 MG TABS Take 1 tablet by mouth daily.     calcium acetate (PHOSLO) 667 MG capsule Take 2,001 mg by mouth 3 (three) times daily with meals.     cetirizine (ZYRTEC) 10 MG tablet Take 10 mg by mouth daily.     Doxercalciferol (HECTOROL IV) as directed. dialysis center     famotidine (PEPCID) 20 MG tablet Take 20 mg by mouth 2 (two) times daily.     fluticasone (FLONASE) 50 MCG/ACT nasal spray Place 2 sprays into both nostrils daily as needed for allergies.      gabapentin (NEURONTIN) 300 MG capsule Take 1 capsule (300 mg total) by mouth 2 (two) times daily as needed. 60 capsule 0   GLUCAGON EMERGENCY 1 MG injection Inject 1 mg into the muscle daily as needed (low blood sugar).     Glucose-Cholecalciferol (RELION GLUCOSE SHOT) LIQD Take 1 Dose by mouth daily as needed (low blood sugar).     HYDROmorphone (DILAUDID) 2 MG tablet Take 1 tablet (2 mg total) by mouth daily as needed for severe pain. 30 tablet    Insulin Disposable Pump (OMNIPOD 5 G6 POD, GEN 5,) MISC by Does not apply route.     insulin lispro (HUMALOG) 100 UNIT/ML injection Inject 75-120 Units into the skin continuous. Per insulin pump     levothyroxine (SYNTHROID, LEVOTHROID) 112 MCG tablet Take 112 mcg by mouth at bedtime.      loperamide (IMODIUM A-D) 2 MG tablet Take 2-4 mg by mouth 4 (four) times daily as needed for diarrhea or loose stools.     Melatonin 5 MG TABS Take 5 mg by mouth at bedtime.     Methoxy PEG-Epoetin Beta (MIRCERA IJ) as directed. dialysis center     midodrine (PROAMATINE) 10 MG tablet Take 10 mg by mouth 3 (three) times daily.     mupirocin ointment (BACTROBAN) 2 %  as needed.     nitroGLYCERIN (NITROSTAT) 0.4 MG SL tablet Place 1 tablet (0.4 mg total) under the tongue every 5 (five) minutes as needed for chest pain. 25 tablet 2    Polyethyl Glycol-Propyl Glycol (SYSTANE ULTRA PF OP) Place 1 drop into both eyes 4 (four) times daily as needed (dry eyes).     polyethylene glycol (MIRALAX / GLYCOLAX) 17 g packet Take 17 g by mouth daily as needed. 14 each 0   prasugrel (EFFIENT) 10 MG TABS tablet One tablet by mouth ( 10 mg ) daily. 90 tablet 3   promethazine (PHENERGAN) 25 MG tablet Take 25 mg by mouth every 6 (six) hours as needed for nausea or vomiting.     rOPINIRole (REQUIP) 2 MG tablet Take 2 mg by mouth every evening.     sevelamer carbonate (RENVELA) 800 MG tablet Take 1,600 mg by mouth 3 (three) times daily with meals.     testosterone cypionate (DEPOTESTOSTERONE CYPIONATE) 200 MG/ML injection Inject 200 mg into the muscle every 14 (fourteen) days.   1   triamcinolone cream (KENALOG) 0.1 % Apply 1 application topically as needed.     amLODipine (NORVASC) 5 MG tablet Take 5 mg by mouth daily. (Patient not taking: Reported on 05/14/2021)     omeprazole (PRILOSEC) 40 MG capsule Take 1 capsule by mouth 2 (two) times daily. (Patient not taking: Reported on 05/14/2021)     No current facility-administered medications for this visit.    Allergies  Allergen Reactions   Iodinated Contrast Media Itching, Rash and Other (See Comments)    Systemic itching and transient rash appearance within hours of receiving contrast     Prednisone Other (See Comments)    Increases sugar levels   Adhesive [Tape] Itching and Other (See Comments)    Blisters on arm from adhesive tape at dialysis    Amoxicillin Itching, Rash and Other (See Comments)    Did it involve swelling of the face/tongue/throat, SOB, or low BP? No Did it involve sudden or severe rash/hives, skin peeling, or any reaction on the inside of your mouth or nose? No Did you need to seek medical attention at a hospital or doctor's office? No When did it last happen?      10+ years If all above answers are NO, may proceed with cephalosporin use.    Clindamycin/Lincomycin  Hives and Itching   Enalapril Rash and Cough    Cough   Mobic [Meloxicam] Hives and Rash   Morphine Hives, Nausea Only and Rash   Brilinta [Ticagrelor] Nausea And Vomiting   Ciprofloxacin Itching and Rash   Clopidogrel Rash   Codeine Hives, Itching and Rash   Doxycycline Rash   Hydrocodone Rash   Levofloxacin Hives and Itching   Oxycodone Rash   Rocephin [Ceftriaxone] Itching    Social History   Socioeconomic History   Marital status: Married    Spouse name: Joelene Millin    Number of children: 0   Years of education: 16   Highest education level: Not on file  Occupational History   Occupation: Disabled  Tobacco Use   Smoking status: Never   Smokeless tobacco: Never  Vaping Use   Vaping Use: Never used  Substance and Sexual Activity   Alcohol use: Yes    Comment: rare   Drug use: No   Sexual activity: Not Currently  Other Topics Concern   Not on file  Social History Narrative   Patient lives in [place] with his wife Joelene Millin.  Patient enjoys playing with his lab mix, doing puzzles, and collecting liters.    Patient is disabled and has an aide who helps with ADLs.   Social Determinants of Health   Financial Resource Strain: Low Risk    Difficulty of Paying Living Expenses: Not hard at all  Food Insecurity: No Food Insecurity   Worried About Charity fundraiser in the Last Year: Never true   [place] in the Last Year: Never true  Transportation Needs: No Transportation Needs   Lack of Transportation (Medical): No   Lack of Transportation (Non-Medical): No  Physical Activity: Inactive   Days of Exercise per Week: 0 days   Minutes of Exercise per Session: 0 min  Stress: No Stress Concern Present   Feeling of Stress : Only a little  Social Connections: Moderately Integrated   Frequency of Communication with Friends and Family: More than three times a week   Frequency of Social Gatherings with Friends and Family: More than three times a week   Attends  Religious Services: 1 to 4 times per year   Active Member of [employer] or Organizations: No   Attends Music therapist: Never   Marital Status: Married  Human resources officer Violence: Not At Risk   Fear of Current or Ex-Partner: No   Emotionally Abused: No   Physically Abused: No   Sexually Abused: No    Family History  Problem Relation Age of Onset   Colon polyps Father    Diabetes Father    Hypertension Father    Hypertension Mother    Diabetes Mother    Hyperlipidemia Mother    Breast cancer Maternal Aunt    Bone cancer Maternal Aunt    Liver cancer Maternal Aunt    Malignant hyperthermia Neg Hx    Heart attack Neg Hx    Stroke Neg Hx    Colon cancer Neg Hx    Stomach cancer Neg Hx    Pancreatic cancer Neg Hx    Esophageal cancer Neg Hx     Review of Systems:  As stated in the HPI and otherwise negative.   BP (!) 142/72    Pulse 81    Ht _0  (1.727 m)    Wt 242 lb (109.8 kg)    SpO2 95%    BMI 36.80 kg/m   Physical Examination:  General: Well developed, well nourished, NAD  HEENT: OP clear, mucus membranes moist  SKIN: warm, dry. No rashes. Neuro: No focal deficits  Musculoskeletal: Muscle strength 5/5 all ext  Psychiatric: Mood and affect normal  Neck: No JVD, no carotid bruits, no thyromegaly, no lymphadenopathy.  Lungs:Clear bilaterally, no wheezes, rhonci, crackles Cardiovascular: Regular rate and rhythm. No murmurs, gallops or rubs. Abdomen:Soft. Bowel sounds present. Non-tender.  Extremities: No lower extremity edema. Pulses are 2 + in the bilateral DP/PT.  Echo July 2018: Left ventricle: The cavity size was normal. Wall thickness was   increased in a pattern of moderate LVH. Systolic function was   normal. The estimated ejection fraction was in the range of 60%   to 65%. Wall motion was normal; there were no regional wall   motion abnormalities. Doppler parameters are consistent with   abnormal left ventricular relaxation (grade 1 diastolic    dysfunction). The E/e&' ratio is >15, suggesting elevated LV   filling pressure. - Mitral valve: Calcified annulus. Mildly thickened leaflets .   There was trivial regurgitation. - Left atrium: The atrium was normal in  size. - Tricuspid valve: There was mild regurgitation. - Pulmonary arteries: PA peak pressure: 39 mm Hg (S). - Inferior vena cava: The vessel was normal in size. The   respirophasic diameter changes were in the normal range (>= 50%),   consistent with normal central venous pressure.   Impressions:   - LVEF 60-65%, moderate LVH, normal wall motion, grade 1 DD with   elevated LV filling pressure, trivial MR, mild TR, RVSP 39 mmHg,   normal IVC.  Cardiac cath Sept 2017:  Conclusion 01/01/16   Ost LAD lesion, 30 %stenosed. Dist LAD lesion, 30 %stenosed. Prox RCA to Mid RCA lesion, 0 %stenosed. 1st RPLB lesion, 50 %stenosed. A STENT XIENCE ALPINE RX [id number] drug eluting stent was successfully placed. 1st Diag lesion, 95 %stenosed. Post intervention, there is a 0% residual stenosis. Dist RCA lesion, 30 %stenosed.   Two-vessel coronary obstructive disease with mild calcification of the proximal LAD with 30% narrowing, progressive 95% mid diagonal stenosis, 30% mid LAD narrowing; normal left circumflex coronary artery; and calcified large RCA with a widely patent stent in the mid RCA at the site of previous atherectomy, 30% acute margin narrowing, and 50% inferior branch PDA stenosis.   Successful PCI to the diagonal vessel with ultimate insertion of a 2.2515 mm Xience Alpine DES stent postdilated with a 2.4 mm with the 95% stenosis being reduced to 0%.    EKG:  EKG is not  ordered today. The ekg ordered today demonstrates   Recent Labs: 05/20/2020: B Natriuretic Peptide 233.8 05/21/2020: TSH 3.016 05/25/2020: ALT 116; BUN 50; Creatinine, Ser 12.39; Hemoglobin 8.5; Platelets 276; Potassium 4.5; Sodium 134   Lipid Panel    Component Value Date/Time   CHOL 116 05/21/2020  0945   TRIG 118 05/21/2020 0945   HDL 32 (L) 05/21/2020 0945   CHOLHDL 3.6 05/21/2020 0945   VLDL 24 05/21/2020 0945   LDLCALC 60 05/21/2020 0945     Wt Readings from Last 3 Encounters:  05/14/21 242 lb (109.8 kg)  04/27/21 246 lb (111.6 kg)  10/06/20 239 lb 12.8 oz (108.8 kg)     Other studies Reviewed: Additional studies/ records that were reviewed today include: . Review of the above records demonstrates:   Assessment and Plan:   1. CAD without angina: He is known to have CAD with normal LV systolic function and no valve disease. No chest pain. He has prior stenting of the RCA and diagonal branches. Non-obstructive disease in the LAD and Circumflex. Continue ASA, Effient and statin.    2. ESRD: he is on HD  3. Chronic diastolic CHF: Volume management by dialysis.   4. Morbid obesity: Weight loss is encouraged.  5. HTN: BP controlled. He is on longer on Norvasc.   6. Hyperlipidemia: LDL at goal in August 2022 in Endocrine clinic. Continue statin  Current medicines are reviewed at length with the patient today.  The patient does not have concerns regarding medicines.  The following changes have been made:  no change  Labs/ tests ordered today include:   No orders of the defined types were placed in this encounter.    Disposition:   FU with me in 6 months   Signed, Lauree Chandler, MD 05/14/2021 12:30 PM    [facility] [address], [place], [place]  [address] Phone: [phone]; Fax: [phone]

## 2021-05-14 ENCOUNTER — Ambulatory Visit: Payer: Medicare Other | Admitting: Cardiovascular Disease

## 2021-05-14 ENCOUNTER — Other Ambulatory Visit: Payer: Self-pay

## 2021-05-14 ENCOUNTER — Encounter: Payer: Self-pay | Admitting: Cardiovascular Disease

## 2021-05-14 VITALS — BP 142/72 | HR 81 | Ht 68.0 in | Wt 242.0 lb

## 2021-05-14 DIAGNOSIS — N2581 Secondary hyperparathyroidism of renal origin: Secondary | ICD-10-CM | POA: Diagnosis not present

## 2021-05-14 DIAGNOSIS — I10 Essential (primary) hypertension: Secondary | ICD-10-CM | POA: Diagnosis not present

## 2021-05-14 DIAGNOSIS — E782 Mixed hyperlipidemia: Secondary | ICD-10-CM

## 2021-05-14 DIAGNOSIS — Z992 Dependence on renal dialysis: Secondary | ICD-10-CM | POA: Diagnosis not present

## 2021-05-14 DIAGNOSIS — D509 Iron deficiency anemia, unspecified: Secondary | ICD-10-CM | POA: Diagnosis not present

## 2021-05-14 DIAGNOSIS — I5032 Chronic diastolic (congestive) heart failure: Secondary | ICD-10-CM | POA: Diagnosis not present

## 2021-05-14 DIAGNOSIS — I2510 Atherosclerotic heart disease of native coronary artery without angina pectoris: Secondary | ICD-10-CM | POA: Diagnosis not present

## 2021-05-14 DIAGNOSIS — D689 Coagulation defect, unspecified: Secondary | ICD-10-CM | POA: Diagnosis not present

## 2021-05-14 DIAGNOSIS — E1022 Type 1 diabetes mellitus with diabetic chronic kidney disease: Secondary | ICD-10-CM | POA: Diagnosis not present

## 2021-05-14 DIAGNOSIS — N186 End stage renal disease: Secondary | ICD-10-CM | POA: Diagnosis not present

## 2021-05-14 DIAGNOSIS — T82818A Embolism of vascular prosthetic devices, implants and grafts, initial encounter: Secondary | ICD-10-CM | POA: Diagnosis not present

## 2021-05-14 DIAGNOSIS — D631 Anemia in chronic kidney disease: Secondary | ICD-10-CM | POA: Diagnosis not present

## 2021-05-14 NOTE — Patient Instructions (Signed)
Medication Instructions:  No changes *If you need a refill on your cardiac medications before your next appointment, please call your pharmacy*  Lab Work: none If you have labs (blood work) drawn today and your tests are completely normal, you will receive your results only by: . MyChart Message (if you have MyChart) OR . A paper copy in the mail If you have any lab test that is abnormal or we need to change your treatment, we will call you to review the results.  Testing/Procedures: none  Follow-Up: At [facility], you and your health needs are our priority.  As part of our continuing mission to provide you with exceptional heart care, we have created designated Provider Care Teams.  These Care Teams include your primary Cardiologist (physician) and Advanced Practice Providers (APPs -  Physician Assistants and Nurse Practitioners) who all work together to provide you with the care you need, when you need it.  Your next appointment:   12 month(s)  The format for your next appointment:   In Person  Provider:   Christopher McAlhany, MD  Other Instructions   

## 2021-05-17 DIAGNOSIS — Z992 Dependence on renal dialysis: Secondary | ICD-10-CM | POA: Diagnosis not present

## 2021-05-17 DIAGNOSIS — D631 Anemia in chronic kidney disease: Secondary | ICD-10-CM | POA: Diagnosis not present

## 2021-05-17 DIAGNOSIS — N2581 Secondary hyperparathyroidism of renal origin: Secondary | ICD-10-CM | POA: Diagnosis not present

## 2021-05-17 DIAGNOSIS — D689 Coagulation defect, unspecified: Secondary | ICD-10-CM | POA: Diagnosis not present

## 2021-05-17 DIAGNOSIS — T82818A Embolism of vascular prosthetic devices, implants and grafts, initial encounter: Secondary | ICD-10-CM | POA: Diagnosis not present

## 2021-05-17 DIAGNOSIS — E1022 Type 1 diabetes mellitus with diabetic chronic kidney disease: Secondary | ICD-10-CM | POA: Diagnosis not present

## 2021-05-17 DIAGNOSIS — N186 End stage renal disease: Secondary | ICD-10-CM | POA: Diagnosis not present

## 2021-05-17 DIAGNOSIS — D509 Iron deficiency anemia, unspecified: Secondary | ICD-10-CM | POA: Diagnosis not present

## 2021-05-19 DIAGNOSIS — D509 Iron deficiency anemia, unspecified: Secondary | ICD-10-CM | POA: Diagnosis not present

## 2021-05-19 DIAGNOSIS — N186 End stage renal disease: Secondary | ICD-10-CM | POA: Diagnosis not present

## 2021-05-19 DIAGNOSIS — N2581 Secondary hyperparathyroidism of renal origin: Secondary | ICD-10-CM | POA: Diagnosis not present

## 2021-05-19 DIAGNOSIS — T82818A Embolism of vascular prosthetic devices, implants and grafts, initial encounter: Secondary | ICD-10-CM | POA: Diagnosis not present

## 2021-05-19 DIAGNOSIS — D631 Anemia in chronic kidney disease: Secondary | ICD-10-CM | POA: Diagnosis not present

## 2021-05-19 DIAGNOSIS — Z992 Dependence on renal dialysis: Secondary | ICD-10-CM | POA: Diagnosis not present

## 2021-05-19 DIAGNOSIS — D689 Coagulation defect, unspecified: Secondary | ICD-10-CM | POA: Diagnosis not present

## 2021-05-19 DIAGNOSIS — E1022 Type 1 diabetes mellitus with diabetic chronic kidney disease: Secondary | ICD-10-CM | POA: Diagnosis not present

## 2021-05-20 ENCOUNTER — Other Ambulatory Visit: Payer: Self-pay

## 2021-05-20 ENCOUNTER — Ambulatory Visit (INDEPENDENT_AMBULATORY_CARE_PROVIDER_SITE_OTHER)
Admission: RE | Admit: 2021-05-20 | Discharge: 2021-05-20 | Disposition: A | Discharge status: Home / Self Care | Payer: Medicare Other | Source: Ambulatory Visit | Attending: Internal Medicine | Admitting: Internal Medicine

## 2021-05-20 ENCOUNTER — Other Ambulatory Visit: Payer: Self-pay | Admitting: Internal Medicine

## 2021-05-20 DIAGNOSIS — R058 Other specified cough: Secondary | ICD-10-CM

## 2021-05-20 DIAGNOSIS — J341 Cyst and mucocele of nose and nasal sinus: Secondary | ICD-10-CM | POA: Diagnosis not present

## 2021-05-20 DIAGNOSIS — J209 Acute bronchitis, unspecified: Secondary | ICD-10-CM | POA: Diagnosis not present

## 2021-05-21 DIAGNOSIS — D689 Coagulation defect, unspecified: Secondary | ICD-10-CM | POA: Diagnosis not present

## 2021-05-21 DIAGNOSIS — N2581 Secondary hyperparathyroidism of renal origin: Secondary | ICD-10-CM | POA: Diagnosis not present

## 2021-05-21 DIAGNOSIS — N186 End stage renal disease: Secondary | ICD-10-CM | POA: Diagnosis not present

## 2021-05-21 DIAGNOSIS — D631 Anemia in chronic kidney disease: Secondary | ICD-10-CM | POA: Diagnosis not present

## 2021-05-21 DIAGNOSIS — D509 Iron deficiency anemia, unspecified: Secondary | ICD-10-CM | POA: Diagnosis not present

## 2021-05-21 DIAGNOSIS — Z992 Dependence on renal dialysis: Secondary | ICD-10-CM | POA: Diagnosis not present

## 2021-05-21 DIAGNOSIS — E1022 Type 1 diabetes mellitus with diabetic chronic kidney disease: Secondary | ICD-10-CM | POA: Diagnosis not present

## 2021-05-21 DIAGNOSIS — T82818A Embolism of vascular prosthetic devices, implants and grafts, initial encounter: Secondary | ICD-10-CM | POA: Diagnosis not present

## 2021-05-24 DIAGNOSIS — D689 Coagulation defect, unspecified: Secondary | ICD-10-CM | POA: Diagnosis not present

## 2021-05-24 DIAGNOSIS — E1022 Type 1 diabetes mellitus with diabetic chronic kidney disease: Secondary | ICD-10-CM | POA: Diagnosis not present

## 2021-05-24 DIAGNOSIS — D509 Iron deficiency anemia, unspecified: Secondary | ICD-10-CM | POA: Diagnosis not present

## 2021-05-24 DIAGNOSIS — T82818A Embolism of vascular prosthetic devices, implants and grafts, initial encounter: Secondary | ICD-10-CM | POA: Diagnosis not present

## 2021-05-24 DIAGNOSIS — N186 End stage renal disease: Secondary | ICD-10-CM | POA: Diagnosis not present

## 2021-05-24 DIAGNOSIS — N2581 Secondary hyperparathyroidism of renal origin: Secondary | ICD-10-CM | POA: Diagnosis not present

## 2021-05-24 DIAGNOSIS — Z992 Dependence on renal dialysis: Secondary | ICD-10-CM | POA: Diagnosis not present

## 2021-05-24 DIAGNOSIS — D631 Anemia in chronic kidney disease: Secondary | ICD-10-CM | POA: Diagnosis not present

## 2021-05-24 NOTE — Progress Notes (Signed)
Spoke with pt and notified of results per Dr. Wert. Pt verbalized understanding and denied any questions. 

## 2021-05-25 DIAGNOSIS — Z794 Long term (current) use of insulin: Secondary | ICD-10-CM | POA: Diagnosis not present

## 2021-05-25 DIAGNOSIS — T8612 Kidney transplant failure: Secondary | ICD-10-CM | POA: Diagnosis not present

## 2021-05-25 DIAGNOSIS — H524 Presbyopia: Secondary | ICD-10-CM | POA: Diagnosis not present

## 2021-05-25 DIAGNOSIS — N186 End stage renal disease: Secondary | ICD-10-CM | POA: Diagnosis not present

## 2021-05-25 DIAGNOSIS — H35043 Retinal micro-aneurysms, unspecified, bilateral: Secondary | ICD-10-CM | POA: Diagnosis not present

## 2021-05-25 DIAGNOSIS — E103551 Type 1 diabetes mellitus with stable proliferative diabetic retinopathy, right eye: Secondary | ICD-10-CM | POA: Diagnosis not present

## 2021-05-25 DIAGNOSIS — E103532 Type 1 diabetes mellitus with proliferative diabetic retinopathy with traction retinal detachment not involving the macula, left eye: Secondary | ICD-10-CM | POA: Diagnosis not present

## 2021-05-25 DIAGNOSIS — Z992 Dependence on renal dialysis: Secondary | ICD-10-CM | POA: Diagnosis not present

## 2021-05-31 DIAGNOSIS — E108 Type 1 diabetes mellitus with unspecified complications: Secondary | ICD-10-CM | POA: Diagnosis not present

## 2021-06-04 ENCOUNTER — Institutional Professional Consult (permissible substitution): Payer: Medicare Other | Admitting: Pulmonary Disease

## 2021-06-10 DIAGNOSIS — E10649 Type 1 diabetes mellitus with hypoglycemia without coma: Secondary | ICD-10-CM | POA: Diagnosis not present

## 2021-06-10 DIAGNOSIS — E039 Hypothyroidism, unspecified: Secondary | ICD-10-CM | POA: Diagnosis not present

## 2021-06-10 DIAGNOSIS — E291 Testicular hypofunction: Secondary | ICD-10-CM | POA: Diagnosis not present

## 2021-06-10 DIAGNOSIS — M79644 Pain in right finger(s): Secondary | ICD-10-CM | POA: Diagnosis not present

## 2021-06-10 DIAGNOSIS — Z89512 Acquired absence of left leg below knee: Secondary | ICD-10-CM | POA: Diagnosis not present

## 2021-06-10 DIAGNOSIS — E1051 Type 1 diabetes mellitus with diabetic peripheral angiopathy without gangrene: Secondary | ICD-10-CM | POA: Diagnosis not present

## 2021-06-10 DIAGNOSIS — E1065 Type 1 diabetes mellitus with hyperglycemia: Secondary | ICD-10-CM | POA: Diagnosis not present

## 2021-06-10 DIAGNOSIS — Z794 Long term (current) use of insulin: Secondary | ICD-10-CM | POA: Diagnosis not present

## 2021-06-10 DIAGNOSIS — L700 Acne vulgaris: Secondary | ICD-10-CM | POA: Diagnosis not present

## 2021-06-10 DIAGNOSIS — E1059 Type 1 diabetes mellitus with other circulatory complications: Secondary | ICD-10-CM | POA: Diagnosis not present

## 2021-06-10 DIAGNOSIS — Z992 Dependence on renal dialysis: Secondary | ICD-10-CM | POA: Diagnosis not present

## 2021-06-10 DIAGNOSIS — N186 End stage renal disease: Secondary | ICD-10-CM | POA: Diagnosis not present

## 2021-06-10 DIAGNOSIS — M1711 Unilateral primary osteoarthritis, right knee: Secondary | ICD-10-CM | POA: Diagnosis not present

## 2021-06-10 DIAGNOSIS — I152 Hypertension secondary to endocrine disorders: Secondary | ICD-10-CM | POA: Diagnosis not present

## 2021-06-10 DIAGNOSIS — M25512 Pain in left shoulder: Secondary | ICD-10-CM | POA: Diagnosis not present

## 2021-06-10 DIAGNOSIS — L570 Actinic keratosis: Secondary | ICD-10-CM | POA: Diagnosis not present

## 2021-06-10 DIAGNOSIS — M20011 Mallet finger of right finger(s): Secondary | ICD-10-CM | POA: Diagnosis not present

## 2021-06-10 DIAGNOSIS — E103593 Type 1 diabetes mellitus with proliferative diabetic retinopathy without macular edema, bilateral: Secondary | ICD-10-CM | POA: Diagnosis not present

## 2021-06-17 DIAGNOSIS — R590 Localized enlarged lymph nodes: Secondary | ICD-10-CM | POA: Diagnosis not present

## 2021-06-17 DIAGNOSIS — R1319 Other dysphagia: Secondary | ICD-10-CM | POA: Diagnosis not present

## 2021-06-22 DIAGNOSIS — T8612 Kidney transplant failure: Secondary | ICD-10-CM | POA: Diagnosis not present

## 2021-06-22 DIAGNOSIS — N186 End stage renal disease: Secondary | ICD-10-CM | POA: Diagnosis not present

## 2021-06-22 DIAGNOSIS — Z992 Dependence on renal dialysis: Secondary | ICD-10-CM | POA: Diagnosis not present

## 2021-06-30 DIAGNOSIS — E108 Type 1 diabetes mellitus with unspecified complications: Secondary | ICD-10-CM | POA: Diagnosis not present

## 2021-07-14 ENCOUNTER — Other Ambulatory Visit: Payer: Self-pay

## 2021-07-23 DIAGNOSIS — T8612 Kidney transplant failure: Secondary | ICD-10-CM | POA: Diagnosis not present

## 2021-07-23 DIAGNOSIS — Z992 Dependence on renal dialysis: Secondary | ICD-10-CM | POA: Diagnosis not present

## 2021-07-23 DIAGNOSIS — N186 End stage renal disease: Secondary | ICD-10-CM | POA: Diagnosis not present

## 2021-07-24 DIAGNOSIS — R0689 Other abnormalities of breathing: Secondary | ICD-10-CM | POA: Diagnosis not present

## 2021-07-24 DIAGNOSIS — K55059 Acute (reversible) ischemia of intestine, part and extent unspecified: Secondary | ICD-10-CM | POA: Diagnosis not present

## 2021-07-24 DIAGNOSIS — R578 Other shock: Secondary | ICD-10-CM | POA: Diagnosis not present

## 2021-07-24 DIAGNOSIS — R140 Abdominal distension (gaseous): Secondary | ICD-10-CM | POA: Diagnosis not present

## 2021-07-24 DIAGNOSIS — N186 End stage renal disease: Secondary | ICD-10-CM | POA: Diagnosis not present

## 2021-07-24 DIAGNOSIS — R0602 Shortness of breath: Secondary | ICD-10-CM | POA: Diagnosis not present

## 2021-07-24 DIAGNOSIS — Z515 Encounter for palliative care: Secondary | ICD-10-CM | POA: Diagnosis not present

## 2021-07-24 DIAGNOSIS — R0902 Hypoxemia: Secondary | ICD-10-CM | POA: Diagnosis not present

## 2021-07-24 DIAGNOSIS — G2581 Restless legs syndrome: Secondary | ICD-10-CM | POA: Diagnosis not present

## 2021-07-24 DIAGNOSIS — N261 Atrophy of kidney (terminal): Secondary | ICD-10-CM | POA: Diagnosis not present

## 2021-07-24 DIAGNOSIS — K254 Chronic or unspecified gastric ulcer with hemorrhage: Secondary | ICD-10-CM | POA: Diagnosis not present

## 2021-07-24 DIAGNOSIS — K8020 Calculus of gallbladder without cholecystitis without obstruction: Secondary | ICD-10-CM | POA: Diagnosis not present

## 2021-07-24 DIAGNOSIS — D649 Anemia, unspecified: Secondary | ICD-10-CM | POA: Diagnosis not present

## 2021-07-24 DIAGNOSIS — I700 Atherosclerosis of aorta: Secondary | ICD-10-CM | POA: Diagnosis not present

## 2021-07-24 DIAGNOSIS — Z4682 Encounter for fitting and adjustment of non-vascular catheter: Secondary | ICD-10-CM | POA: Diagnosis not present

## 2021-07-24 DIAGNOSIS — J9601 Acute respiratory failure with hypoxia: Secondary | ICD-10-CM | POA: Diagnosis not present

## 2021-07-24 DIAGNOSIS — D72829 Elevated white blood cell count, unspecified: Secondary | ICD-10-CM | POA: Diagnosis not present

## 2021-07-24 DIAGNOSIS — A419 Sepsis, unspecified organism: Secondary | ICD-10-CM | POA: Diagnosis not present

## 2021-07-24 DIAGNOSIS — E1122 Type 2 diabetes mellitus with diabetic chronic kidney disease: Secondary | ICD-10-CM | POA: Diagnosis not present

## 2021-07-24 DIAGNOSIS — K2901 Acute gastritis with bleeding: Secondary | ICD-10-CM | POA: Diagnosis not present

## 2021-07-24 DIAGNOSIS — Z20822 Contact with and (suspected) exposure to covid-19: Secondary | ICD-10-CM | POA: Diagnosis not present

## 2021-07-24 DIAGNOSIS — K551 Chronic vascular disorders of intestine: Secondary | ICD-10-CM | POA: Diagnosis not present

## 2021-07-24 DIAGNOSIS — R42 Dizziness and giddiness: Secondary | ICD-10-CM | POA: Diagnosis not present

## 2021-07-24 DIAGNOSIS — K5730 Diverticulosis of large intestine without perforation or abscess without bleeding: Secondary | ICD-10-CM | POA: Diagnosis not present

## 2021-07-24 DIAGNOSIS — K250 Acute gastric ulcer with hemorrhage: Secondary | ICD-10-CM | POA: Diagnosis not present

## 2021-07-24 DIAGNOSIS — I132 Hypertensive heart and chronic kidney disease with heart failure and with stage 5 chronic kidney disease, or end stage renal disease: Secondary | ICD-10-CM | POA: Diagnosis not present

## 2021-07-24 DIAGNOSIS — K7200 Acute and subacute hepatic failure without coma: Secondary | ICD-10-CM | POA: Diagnosis not present

## 2021-07-24 DIAGNOSIS — E039 Hypothyroidism, unspecified: Secondary | ICD-10-CM | POA: Diagnosis not present

## 2021-07-24 DIAGNOSIS — N281 Cyst of kidney, acquired: Secondary | ICD-10-CM | POA: Diagnosis not present

## 2021-07-24 DIAGNOSIS — G4733 Obstructive sleep apnea (adult) (pediatric): Secondary | ICD-10-CM | POA: Diagnosis not present

## 2021-07-24 DIAGNOSIS — I2510 Atherosclerotic heart disease of native coronary artery without angina pectoris: Secondary | ICD-10-CM | POA: Diagnosis not present

## 2021-07-24 DIAGNOSIS — K922 Gastrointestinal hemorrhage, unspecified: Secondary | ICD-10-CM | POA: Diagnosis not present

## 2021-07-24 DIAGNOSIS — I120 Hypertensive chronic kidney disease with stage 5 chronic kidney disease or end stage renal disease: Secondary | ICD-10-CM | POA: Diagnosis not present

## 2021-07-24 DIAGNOSIS — M79604 Pain in right leg: Secondary | ICD-10-CM | POA: Diagnosis not present

## 2021-07-24 DIAGNOSIS — K429 Umbilical hernia without obstruction or gangrene: Secondary | ICD-10-CM | POA: Diagnosis not present

## 2021-07-24 DIAGNOSIS — J811 Chronic pulmonary edema: Secondary | ICD-10-CM | POA: Diagnosis not present

## 2021-07-24 DIAGNOSIS — E1022 Type 1 diabetes mellitus with diabetic chronic kidney disease: Secondary | ICD-10-CM | POA: Diagnosis not present

## 2021-07-24 DIAGNOSIS — I959 Hypotension, unspecified: Secondary | ICD-10-CM | POA: Diagnosis not present

## 2021-07-24 DIAGNOSIS — K55019 Acute (reversible) ischemia of small intestine, extent unspecified: Secondary | ICD-10-CM | POA: Diagnosis not present

## 2021-07-24 DIAGNOSIS — D62 Acute posthemorrhagic anemia: Secondary | ICD-10-CM | POA: Diagnosis not present

## 2021-07-24 DIAGNOSIS — R579 Shock, unspecified: Secondary | ICD-10-CM | POA: Diagnosis not present

## 2021-07-24 DIAGNOSIS — I4581 Long QT syndrome: Secondary | ICD-10-CM | POA: Diagnosis not present

## 2021-07-24 DIAGNOSIS — R000 Tachycardia, unspecified: Secondary | ICD-10-CM | POA: Diagnosis not present

## 2021-07-24 DIAGNOSIS — E1010 Type 1 diabetes mellitus with ketoacidosis without coma: Secondary | ICD-10-CM | POA: Diagnosis not present

## 2021-07-24 DIAGNOSIS — K559 Vascular disorder of intestine, unspecified: Secondary | ICD-10-CM | POA: Diagnosis not present

## 2021-07-24 DIAGNOSIS — R918 Other nonspecific abnormal finding of lung field: Secondary | ICD-10-CM | POA: Diagnosis not present

## 2021-07-24 DIAGNOSIS — J9811 Atelectasis: Secondary | ICD-10-CM | POA: Diagnosis not present

## 2021-07-24 DIAGNOSIS — I517 Cardiomegaly: Secondary | ICD-10-CM | POA: Diagnosis not present

## 2021-07-24 DIAGNOSIS — R6521 Severe sepsis with septic shock: Secondary | ICD-10-CM | POA: Diagnosis not present

## 2021-07-24 DIAGNOSIS — D65 Disseminated intravascular coagulation [defibrination syndrome]: Secondary | ICD-10-CM | POA: Diagnosis not present

## 2021-07-24 DIAGNOSIS — Z66 Do not resuscitate: Secondary | ICD-10-CM | POA: Diagnosis not present

## 2021-07-24 DIAGNOSIS — G9340 Encephalopathy, unspecified: Secondary | ICD-10-CM | POA: Diagnosis not present

## 2021-07-24 DIAGNOSIS — E872 Acidosis, unspecified: Secondary | ICD-10-CM | POA: Diagnosis not present

## 2021-07-24 DIAGNOSIS — R4182 Altered mental status, unspecified: Secondary | ICD-10-CM | POA: Diagnosis not present

## 2021-07-24 DIAGNOSIS — T8612 Kidney transplant failure: Secondary | ICD-10-CM | POA: Diagnosis not present

## 2021-07-24 DIAGNOSIS — K5989 Other specified functional intestinal disorders: Secondary | ICD-10-CM | POA: Diagnosis not present

## 2021-07-24 DIAGNOSIS — K920 Hematemesis: Secondary | ICD-10-CM | POA: Diagnosis not present

## 2021-07-24 DIAGNOSIS — K259 Gastric ulcer, unspecified as acute or chronic, without hemorrhage or perforation: Secondary | ICD-10-CM | POA: Diagnosis not present

## 2021-08-23 DEATH — deceased

## 2021-09-28 ENCOUNTER — Encounter: Payer: Self-pay | Admitting: *Deleted

## 2021-10-24 ENCOUNTER — Other Ambulatory Visit: Payer: Self-pay | Admitting: Physician Assistant
